# Patient Record
Sex: Female | Born: 1970 | Race: Black or African American | Hispanic: No | Marital: Married | Smoking: Never smoker
Health system: Southern US, Community
[De-identification: ages and names within clinical notes are randomized; demographics above are authoritative.]

## PROBLEM LIST (undated history)

## (undated) ENCOUNTER — Emergency Department (HOSPITAL_COMMUNITY): Payer: 59

## (undated) DIAGNOSIS — D3500 Benign neoplasm of unspecified adrenal gland: Secondary | ICD-10-CM

## (undated) DIAGNOSIS — I639 Cerebral infarction, unspecified: Secondary | ICD-10-CM

## (undated) DIAGNOSIS — E119 Type 2 diabetes mellitus without complications: Secondary | ICD-10-CM

## (undated) DIAGNOSIS — E079 Disorder of thyroid, unspecified: Secondary | ICD-10-CM

## (undated) DIAGNOSIS — F32A Depression, unspecified: Secondary | ICD-10-CM

## (undated) DIAGNOSIS — I10 Essential (primary) hypertension: Secondary | ICD-10-CM

## (undated) DIAGNOSIS — I2510 Atherosclerotic heart disease of native coronary artery without angina pectoris: Secondary | ICD-10-CM

## (undated) DIAGNOSIS — R4689 Other symptoms and signs involving appearance and behavior: Secondary | ICD-10-CM

## (undated) DIAGNOSIS — I67841 Reversible cerebrovascular vasoconstriction syndrome: Secondary | ICD-10-CM

## (undated) DIAGNOSIS — Z9289 Personal history of other medical treatment: Secondary | ICD-10-CM

## (undated) HISTORY — PX: CORONARY ARTERY BYPASS GRAFT: SHX141

## (undated) HISTORY — PX: ABDOMINAL HYSTERECTOMY: SHX81

## (undated) HISTORY — DX: Personal history of other medical treatment: Z92.89

## (undated) HISTORY — PX: OTHER SURGICAL HISTORY: SHX169

## (undated) SURGERY — ESOPHAGOGASTRODUODENOSCOPY (EGD) WITH PROPOFOL
Anesthesia: Monitor Anesthesia Care

---

## 1998-02-12 ENCOUNTER — Emergency Department (HOSPITAL_COMMUNITY): Admission: EM | Admit: 1998-02-12 | Discharge: 1998-02-12 | Payer: Self-pay | Admitting: Emergency Medicine

## 2000-12-11 ENCOUNTER — Emergency Department (HOSPITAL_COMMUNITY): Admission: EM | Admit: 2000-12-11 | Discharge: 2000-12-11 | Payer: Self-pay

## 2012-09-27 DIAGNOSIS — I639 Cerebral infarction, unspecified: Secondary | ICD-10-CM

## 2012-09-27 HISTORY — DX: Cerebral infarction, unspecified: I63.9

## 2014-10-28 DIAGNOSIS — I701 Atherosclerosis of renal artery: Secondary | ICD-10-CM | POA: Insufficient documentation

## 2014-11-28 DIAGNOSIS — B370 Candidal stomatitis: Secondary | ICD-10-CM | POA: Insufficient documentation

## 2015-07-07 DIAGNOSIS — K141 Geographic tongue: Secondary | ICD-10-CM | POA: Insufficient documentation

## 2015-07-18 DIAGNOSIS — R2990 Unspecified symptoms and signs involving the nervous system: Secondary | ICD-10-CM | POA: Insufficient documentation

## 2015-07-18 DIAGNOSIS — G8191 Hemiplegia, unspecified affecting right dominant side: Secondary | ICD-10-CM | POA: Insufficient documentation

## 2015-10-24 DIAGNOSIS — I6523 Occlusion and stenosis of bilateral carotid arteries: Secondary | ICD-10-CM | POA: Insufficient documentation

## 2015-10-25 DIAGNOSIS — Z86018 Personal history of other benign neoplasm: Secondary | ICD-10-CM | POA: Insufficient documentation

## 2016-10-19 DIAGNOSIS — Z91199 Patient's noncompliance with other medical treatment and regimen due to unspecified reason: Secondary | ICD-10-CM | POA: Insufficient documentation

## 2017-02-07 DIAGNOSIS — R21 Rash and other nonspecific skin eruption: Secondary | ICD-10-CM | POA: Insufficient documentation

## 2017-05-13 DIAGNOSIS — H0589 Other disorders of orbit: Secondary | ICD-10-CM | POA: Insufficient documentation

## 2017-12-23 DIAGNOSIS — E669 Obesity, unspecified: Secondary | ICD-10-CM | POA: Insufficient documentation

## 2017-12-24 DIAGNOSIS — D72829 Elevated white blood cell count, unspecified: Secondary | ICD-10-CM | POA: Insufficient documentation

## 2018-01-28 DIAGNOSIS — T8131XD Disruption of external operation (surgical) wound, not elsewhere classified, subsequent encounter: Secondary | ICD-10-CM | POA: Insufficient documentation

## 2018-01-28 DIAGNOSIS — Z951 Presence of aortocoronary bypass graft: Secondary | ICD-10-CM | POA: Insufficient documentation

## 2021-01-19 ENCOUNTER — Other Ambulatory Visit: Payer: Self-pay

## 2021-01-19 ENCOUNTER — Emergency Department: Payer: Medicare Other

## 2021-01-19 ENCOUNTER — Inpatient Hospital Stay: Payer: Medicare Other

## 2021-01-19 ENCOUNTER — Inpatient Hospital Stay
Admission: EM | Admit: 2021-01-19 | Discharge: 2021-02-02 | DRG: 065 | Disposition: A | Discharge status: Skilled Nursing Facility | Payer: Medicare Other | Attending: Student in an Organized Health Care Education/Training Program | Admitting: Student in an Organized Health Care Education/Training Program

## 2021-01-19 DIAGNOSIS — I10 Essential (primary) hypertension: Secondary | ICD-10-CM | POA: Diagnosis present

## 2021-01-19 DIAGNOSIS — E079 Disorder of thyroid, unspecified: Secondary | ICD-10-CM | POA: Diagnosis present

## 2021-01-19 DIAGNOSIS — R4701 Aphasia: Principal | ICD-10-CM

## 2021-01-19 DIAGNOSIS — E119 Type 2 diabetes mellitus without complications: Secondary | ICD-10-CM

## 2021-01-19 DIAGNOSIS — D3500 Benign neoplasm of unspecified adrenal gland: Secondary | ICD-10-CM | POA: Diagnosis present

## 2021-01-19 DIAGNOSIS — R000 Tachycardia, unspecified: Secondary | ICD-10-CM

## 2021-01-19 DIAGNOSIS — I639 Cerebral infarction, unspecified: Secondary | ICD-10-CM | POA: Diagnosis present

## 2021-01-19 DIAGNOSIS — E785 Hyperlipidemia, unspecified: Secondary | ICD-10-CM | POA: Diagnosis present

## 2021-01-19 DIAGNOSIS — I2510 Atherosclerotic heart disease of native coronary artery without angina pectoris: Secondary | ICD-10-CM | POA: Diagnosis present

## 2021-01-19 DIAGNOSIS — R112 Nausea with vomiting, unspecified: Secondary | ICD-10-CM | POA: Diagnosis present

## 2021-01-19 DIAGNOSIS — F32A Depression, unspecified: Secondary | ICD-10-CM | POA: Diagnosis present

## 2021-01-19 DIAGNOSIS — Z888 Allergy status to other drugs, medicaments and biological substances status: Secondary | ICD-10-CM

## 2021-01-19 DIAGNOSIS — F419 Anxiety disorder, unspecified: Secondary | ICD-10-CM | POA: Diagnosis present

## 2021-01-19 DIAGNOSIS — I69354 Hemiplegia and hemiparesis following cerebral infarction affecting left non-dominant side: Secondary | ICD-10-CM | POA: Diagnosis not present

## 2021-01-19 DIAGNOSIS — K219 Gastro-esophageal reflux disease without esophagitis: Secondary | ICD-10-CM | POA: Diagnosis present

## 2021-01-19 DIAGNOSIS — K5900 Constipation, unspecified: Secondary | ICD-10-CM | POA: Diagnosis present

## 2021-01-19 DIAGNOSIS — R29810 Facial weakness: Secondary | ICD-10-CM | POA: Diagnosis present

## 2021-01-19 DIAGNOSIS — R221 Localized swelling, mass and lump, neck: Secondary | ICD-10-CM | POA: Diagnosis present

## 2021-01-19 DIAGNOSIS — Z886 Allergy status to analgesic agent status: Secondary | ICD-10-CM

## 2021-01-19 DIAGNOSIS — Z79899 Other long term (current) drug therapy: Secondary | ICD-10-CM

## 2021-01-19 DIAGNOSIS — Z951 Presence of aortocoronary bypass graft: Secondary | ICD-10-CM

## 2021-01-19 DIAGNOSIS — H5340 Unspecified visual field defects: Secondary | ICD-10-CM | POA: Diagnosis present

## 2021-01-19 DIAGNOSIS — Z91041 Radiographic dye allergy status: Secondary | ICD-10-CM

## 2021-01-19 DIAGNOSIS — R1312 Dysphagia, oropharyngeal phase: Secondary | ICD-10-CM | POA: Diagnosis present

## 2021-01-19 DIAGNOSIS — E7849 Other hyperlipidemia: Secondary | ICD-10-CM | POA: Diagnosis not present

## 2021-01-19 DIAGNOSIS — Z90710 Acquired absence of both cervix and uterus: Secondary | ICD-10-CM

## 2021-01-19 DIAGNOSIS — R531 Weakness: Secondary | ICD-10-CM | POA: Diagnosis not present

## 2021-01-19 DIAGNOSIS — E109 Type 1 diabetes mellitus without complications: Secondary | ICD-10-CM

## 2021-01-19 DIAGNOSIS — R4689 Other symptoms and signs involving appearance and behavior: Secondary | ICD-10-CM

## 2021-01-19 DIAGNOSIS — R471 Dysarthria and anarthria: Secondary | ICD-10-CM | POA: Diagnosis present

## 2021-01-19 DIAGNOSIS — R29715 NIHSS score 15: Secondary | ICD-10-CM | POA: Diagnosis present

## 2021-01-19 DIAGNOSIS — Z880 Allergy status to penicillin: Secondary | ICD-10-CM

## 2021-01-19 DIAGNOSIS — Z8673 Personal history of transient ischemic attack (TIA), and cerebral infarction without residual deficits: Secondary | ICD-10-CM | POA: Diagnosis present

## 2021-01-19 DIAGNOSIS — E1169 Type 2 diabetes mellitus with other specified complication: Secondary | ICD-10-CM

## 2021-01-19 DIAGNOSIS — E1165 Type 2 diabetes mellitus with hyperglycemia: Secondary | ICD-10-CM | POA: Diagnosis present

## 2021-01-19 DIAGNOSIS — I618 Other nontraumatic intracerebral hemorrhage: Secondary | ICD-10-CM | POA: Diagnosis present

## 2021-01-19 DIAGNOSIS — E872 Acidosis, unspecified: Secondary | ICD-10-CM | POA: Diagnosis present

## 2021-01-19 DIAGNOSIS — G450 Vertebro-basilar artery syndrome: Secondary | ICD-10-CM | POA: Diagnosis not present

## 2021-01-19 DIAGNOSIS — K449 Diaphragmatic hernia without obstruction or gangrene: Secondary | ICD-10-CM | POA: Diagnosis present

## 2021-01-19 DIAGNOSIS — R490 Dysphonia: Secondary | ICD-10-CM | POA: Diagnosis present

## 2021-01-19 DIAGNOSIS — R2990 Unspecified symptoms and signs involving the nervous system: Secondary | ICD-10-CM | POA: Diagnosis not present

## 2021-01-19 DIAGNOSIS — I700 Atherosclerosis of aorta: Secondary | ICD-10-CM | POA: Diagnosis present

## 2021-01-19 DIAGNOSIS — Z6839 Body mass index (BMI) 39.0-39.9, adult: Secondary | ICD-10-CM

## 2021-01-19 DIAGNOSIS — Z85841 Personal history of malignant neoplasm of brain: Secondary | ICD-10-CM

## 2021-01-19 DIAGNOSIS — Z808 Family history of malignant neoplasm of other organs or systems: Secondary | ICD-10-CM

## 2021-01-19 DIAGNOSIS — Z20822 Contact with and (suspected) exposure to covid-19: Secondary | ICD-10-CM | POA: Diagnosis present

## 2021-01-19 DIAGNOSIS — K5909 Other constipation: Secondary | ICD-10-CM | POA: Diagnosis not present

## 2021-01-19 DIAGNOSIS — Z794 Long term (current) use of insulin: Secondary | ICD-10-CM

## 2021-01-19 DIAGNOSIS — F320 Major depressive disorder, single episode, mild: Secondary | ICD-10-CM | POA: Diagnosis not present

## 2021-01-19 DIAGNOSIS — I69320 Aphasia following cerebral infarction: Secondary | ICD-10-CM

## 2021-01-19 DIAGNOSIS — E0590 Thyrotoxicosis, unspecified without thyrotoxic crisis or storm: Secondary | ICD-10-CM | POA: Diagnosis present

## 2021-01-19 DIAGNOSIS — D3501 Benign neoplasm of right adrenal gland: Secondary | ICD-10-CM

## 2021-01-19 DIAGNOSIS — R220 Localized swelling, mass and lump, head: Secondary | ICD-10-CM | POA: Diagnosis not present

## 2021-01-19 DIAGNOSIS — I6389 Other cerebral infarction: Secondary | ICD-10-CM | POA: Diagnosis not present

## 2021-01-19 DIAGNOSIS — Z7902 Long term (current) use of antithrombotics/antiplatelets: Secondary | ICD-10-CM

## 2021-01-19 DIAGNOSIS — Z833 Family history of diabetes mellitus: Secondary | ICD-10-CM

## 2021-01-19 DIAGNOSIS — R258 Other abnormal involuntary movements: Secondary | ICD-10-CM | POA: Diagnosis present

## 2021-01-19 HISTORY — DX: Essential (primary) hypertension: I10

## 2021-01-19 HISTORY — DX: Disorder of thyroid, unspecified: E07.9

## 2021-01-19 HISTORY — DX: Atherosclerotic heart disease of native coronary artery without angina pectoris: I25.10

## 2021-01-19 HISTORY — DX: Type 2 diabetes mellitus without complications: E11.9

## 2021-01-19 HISTORY — DX: Depression, unspecified: F32.A

## 2021-01-19 HISTORY — DX: Benign neoplasm of unspecified adrenal gland: D35.00

## 2021-01-19 HISTORY — DX: Cerebral infarction, unspecified: I63.9

## 2021-01-19 LAB — URINE DRUG SCREEN, QUALITATIVE (ARMC ONLY)
Amphetamines, Ur Screen: NOT DETECTED
Barbiturates, Ur Screen: NOT DETECTED
Benzodiazepine, Ur Scrn: NOT DETECTED
Cannabinoid 50 Ng, Ur ~~LOC~~: NOT DETECTED
Cocaine Metabolite,Ur ~~LOC~~: NOT DETECTED
MDMA (Ecstasy)Ur Screen: NOT DETECTED
Methadone Scn, Ur: NOT DETECTED
Opiate, Ur Screen: NOT DETECTED
Phencyclidine (PCP) Ur S: NOT DETECTED
Tricyclic, Ur Screen: POSITIVE — AB

## 2021-01-19 LAB — APTT: aPTT: 29 seconds (ref 24–36)

## 2021-01-19 LAB — URINALYSIS, COMPLETE (UACMP) WITH MICROSCOPIC
Bilirubin Urine: NEGATIVE
Glucose, UA: >1000 mg/dL — AB
Hgb urine dipstick: NEGATIVE
Ketones, ur: 80 mg/dL — AB
Nitrite: NEGATIVE
Protein, ur: NEGATIVE mg/dL
Specific Gravity, Urine: <1.005 — ABNORMAL LOW (ref 1.005–1.030)
pH: 5 (ref 5.0–8.0)

## 2021-01-19 LAB — CBG MONITORING, ED
Glucose-Capillary: 312 mg/dL — ABNORMAL HIGH (ref 70–99)
Glucose-Capillary: 285 mg/dL — ABNORMAL HIGH (ref 70–99)
Glucose-Capillary: 322 mg/dL — ABNORMAL HIGH (ref 70–99)
Glucose-Capillary: 314 mg/dL — ABNORMAL HIGH (ref 70–99)

## 2021-01-19 LAB — CBC
HCT: 39.2 % (ref 36.0–46.0)
Hemoglobin: 13 g/dL (ref 12.0–15.0)
MCH: 26.5 pg (ref 26.0–34.0)
MCHC: 33.2 g/dL (ref 30.0–36.0)
MCV: 79.8 fL — ABNORMAL LOW (ref 80.0–100.0)
Platelets: 366 10*3/uL (ref 150–400)
RBC: 4.91 MIL/uL (ref 3.87–5.11)
RDW: 14.3 % (ref 11.5–15.5)
WBC: 8 10*3/uL (ref 4.0–10.5)
nRBC: 0 % (ref 0.0–0.2)

## 2021-01-19 LAB — COMPREHENSIVE METABOLIC PANEL
ALT: 61 U/L — ABNORMAL HIGH (ref 0–44)
AST: 56 U/L — ABNORMAL HIGH (ref 15–41)
Albumin: 3.7 g/dL (ref 3.5–5.0)
Alkaline Phosphatase: 90 U/L (ref 38–126)
Anion gap: 12 (ref 5–15)
BUN: 12 mg/dL (ref 6–20)
CO2: 24 mmol/L (ref 22–32)
Calcium: 9.1 mg/dL (ref 8.9–10.3)
Chloride: 96 mmol/L — ABNORMAL LOW (ref 98–111)
Creatinine, Ser: 0.7 mg/dL (ref 0.44–1.00)
GFR, Estimated: >60 mL/min (ref >60)
Glucose, Bld: 336 mg/dL — ABNORMAL HIGH (ref 70–99)
Potassium: 3.9 mmol/L (ref 3.5–5.1)
Sodium: 132 mmol/L — ABNORMAL LOW (ref 135–145)
Total Bilirubin: 0.9 mg/dL (ref 0.3–1.2)
Total Protein: 8 g/dL (ref 6.5–8.1)

## 2021-01-19 LAB — LACTIC ACID, PLASMA
Lactic Acid, Venous: 2 mmol/L (ref 0.5–1.9)
Lactic Acid, Venous: 1.4 mmol/L (ref 0.5–1.9)

## 2021-01-19 LAB — DIFFERENTIAL
Abs Immature Granulocytes: 0.04 10*3/uL (ref 0.00–0.07)
Basophils Absolute: 0 10*3/uL (ref 0.0–0.1)
Basophils Relative: 0 %
Eosinophils Absolute: 0 10*3/uL (ref 0.0–0.5)
Eosinophils Relative: 1 %
Immature Granulocytes: 1 %
Lymphocytes Relative: 19 %
Lymphs Abs: 1.5 10*3/uL (ref 0.7–4.0)
Monocytes Absolute: 0.4 10*3/uL (ref 0.1–1.0)
Monocytes Relative: 5 %
Neutro Abs: 5.9 10*3/uL (ref 1.7–7.7)
Neutrophils Relative %: 74 %

## 2021-01-19 LAB — PROTIME-INR
INR: 1 (ref 0.8–1.2)
Prothrombin Time: 13.5 seconds (ref 11.4–15.2)

## 2021-01-19 LAB — TROPONIN I (HIGH SENSITIVITY)
Troponin I (High Sensitivity): 5 ng/L (ref <18)
Troponin I (High Sensitivity): 5 ng/L (ref <18)

## 2021-01-19 LAB — PREGNANCY, URINE: Preg Test, Ur: NEGATIVE

## 2021-01-19 LAB — TSH: TSH: 0.257 u[IU]/mL — ABNORMAL LOW (ref 0.350–4.500)

## 2021-01-19 LAB — RESP PANEL BY RT-PCR (FLU A&B, COVID) ARPGX2
Influenza A by PCR: NEGATIVE
Influenza B by PCR: NEGATIVE
SARS Coronavirus 2 by RT PCR: NEGATIVE

## 2021-01-19 LAB — I-STAT CREATININE, ED: Creatinine, Ser: 0.6 mg/dL (ref 0.44–1.00)

## 2021-01-19 IMAGING — CT CT HEAD CODE STROKE
4 series · 17 of 47 positions shown, 19 images · non-contrast
Comparison: None.

CLINICAL DATA: Code stroke. Generalized weakness. Aphasia and
left-sided deficits from prior stroke

EXAM:
CT HEAD WITHOUT CONTRAST
TECHNIQUE: Contiguous axial images were obtained from the base of the skull
through the vertex without intravenous contrast.

[Series 3: head wo · axial · 0.40mm/px · z∈[-51,+64]mm · 7 of 31 slices shown, 9 images]
[im 4/31  brain]
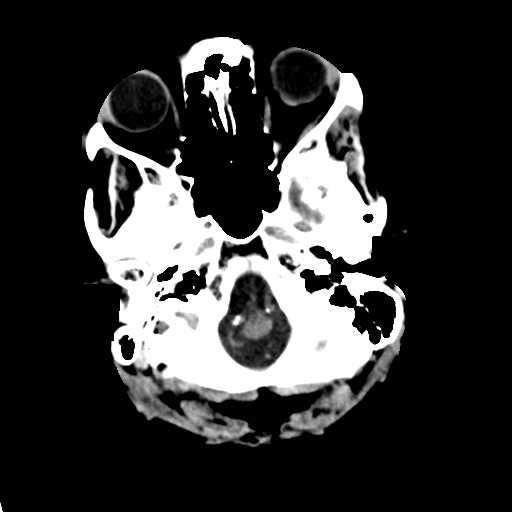
[im 4/31  bone]
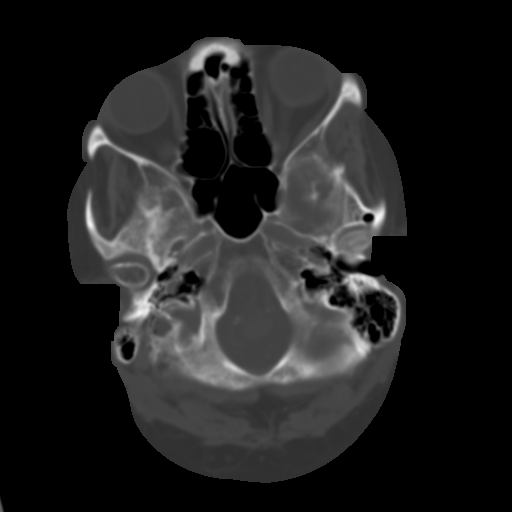
[im 8/31  brain]
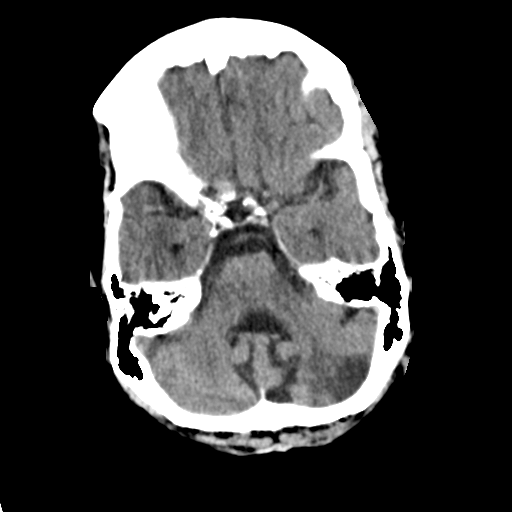
[im 12/31  brain]
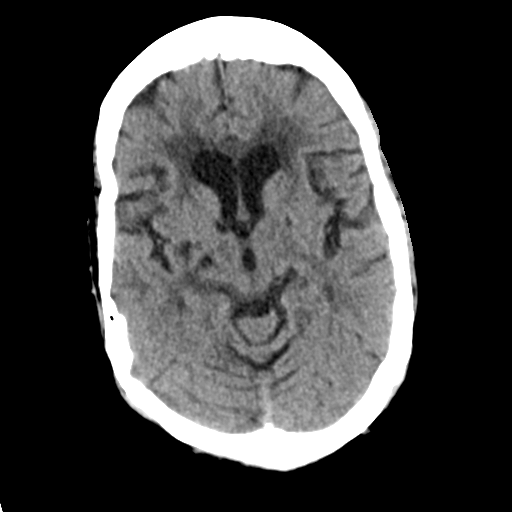
[im 16/31  brain]
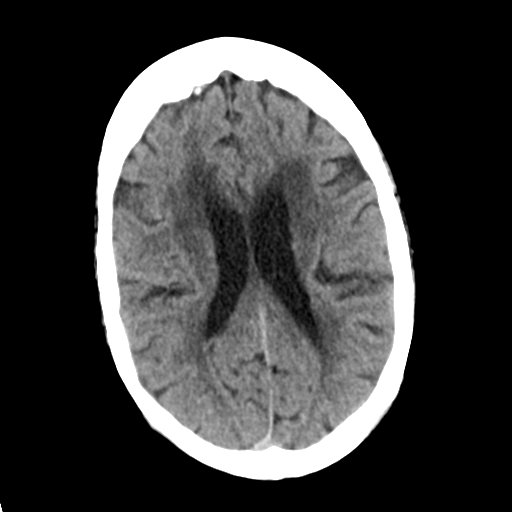
[im 19/31  brain]
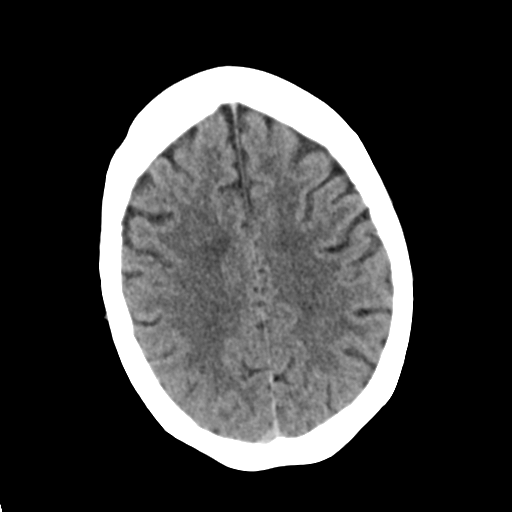
[im 19/31  bone]
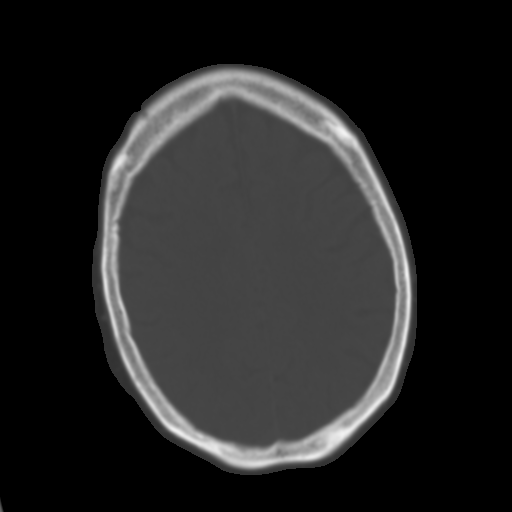
[im 23/31  brain]
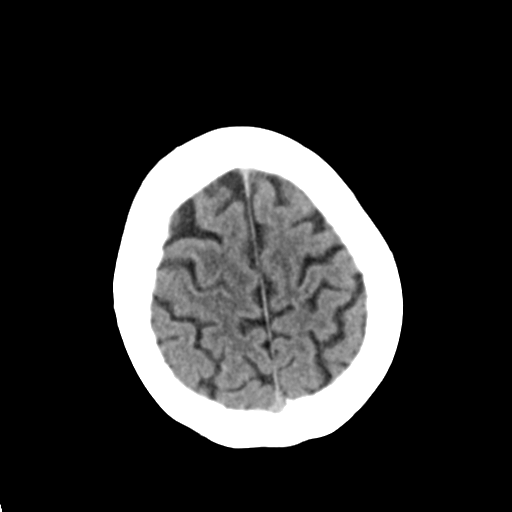
[im 27/31  brain]
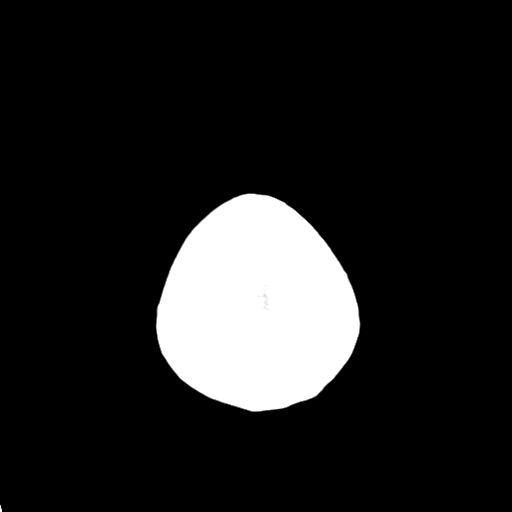

[Series 4: head bone · axial · 0.40mm/px · z∈[-52,+0]mm · 4 of 76 slices shown]
[im 8/76  bone]
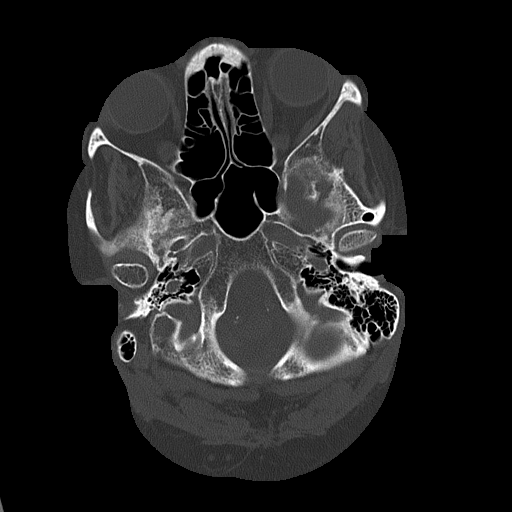
[im 16/76  bone]
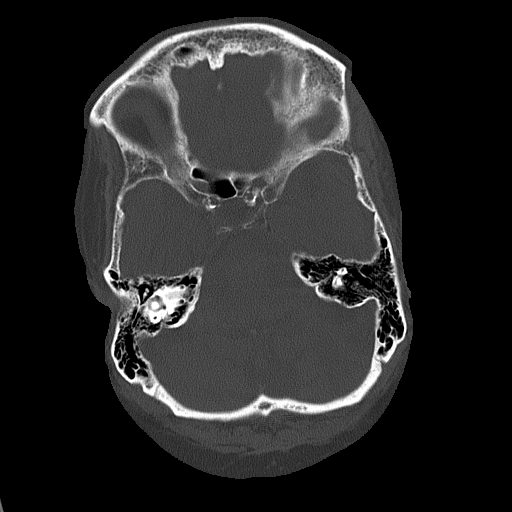
[im 23/76  bone]
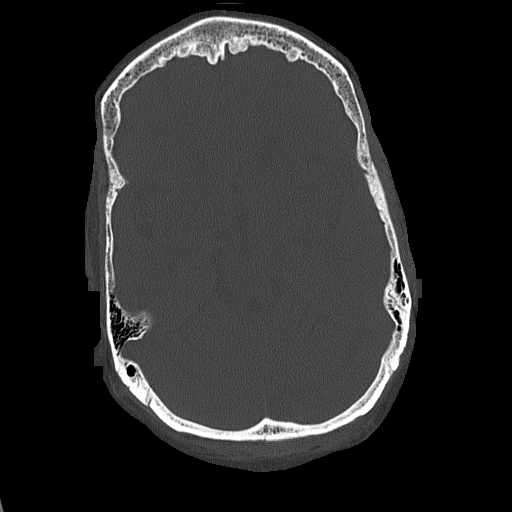
[im 34/76  bone]
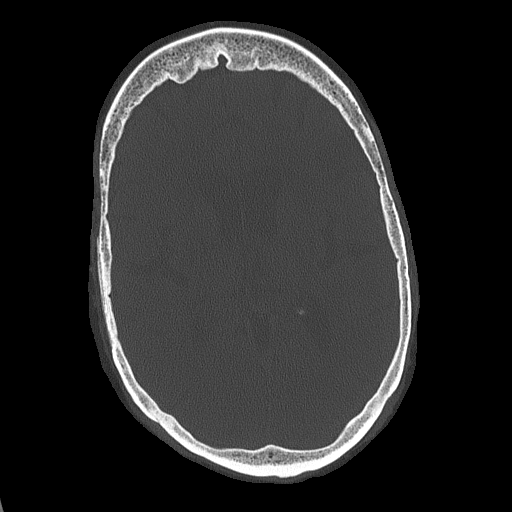

[Series 5: coronal soft tissue · coronal · 0.30mm/px · 3 of 64 slices shown]
[im 22/64  brain]
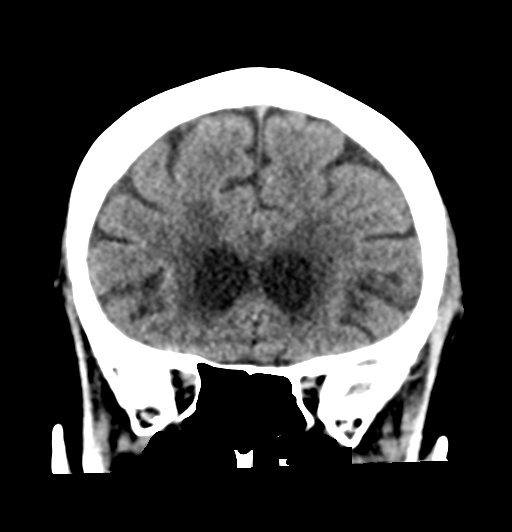
[im 29/64  brain]
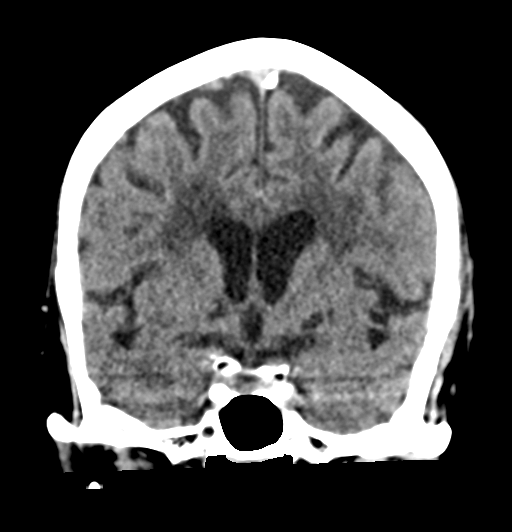
[im 36/64  brain]
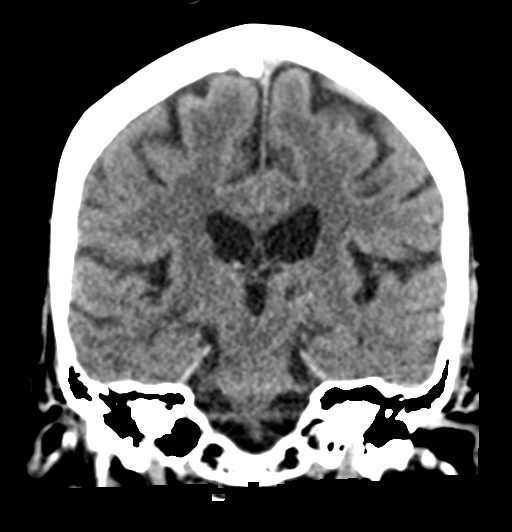

[Series 6: sagittal soft tissue · sagittal · 0.33mm/px · 3 of 50 slices shown]
[im 17/50  brain]
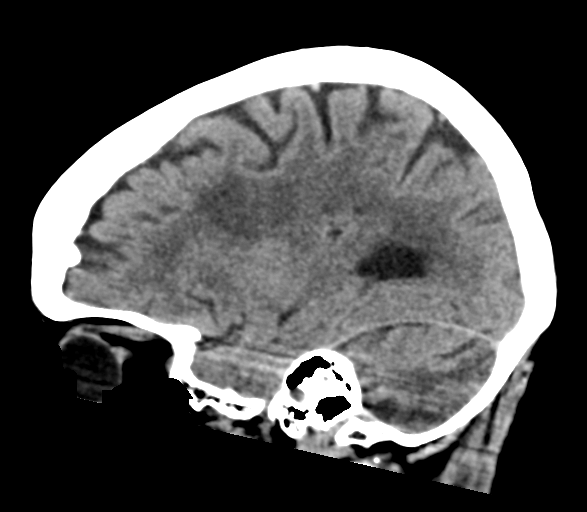
[im 25/50  brain]
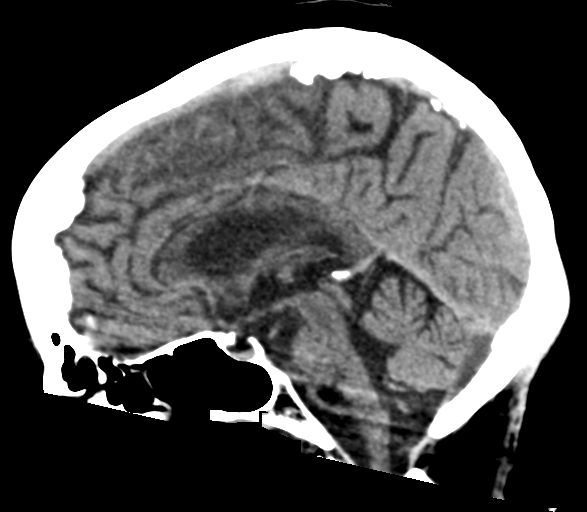
[im 33/50  brain]
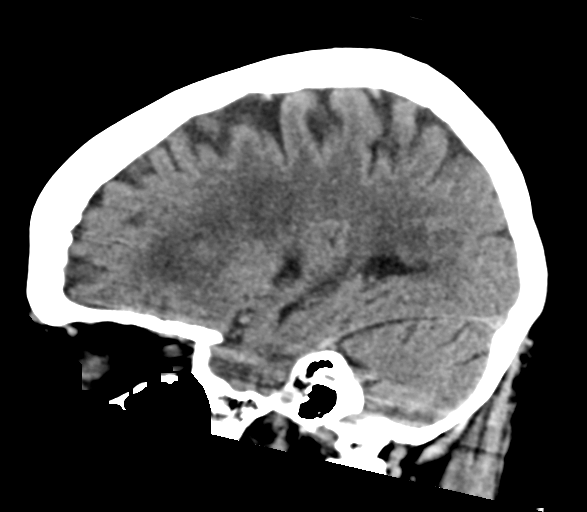

[17 of 47 positions shown; findings below may reference images not displayed]

FINDINGS: Brain: Confluent low-density in the cerebral white matter with
superimposed chronic lacunar infarcts at the deep gray nuclei and
deep white matter tracks. Remote left cerebellar infarction which is
moderate to extensive. Premature brain atrophy. No acute hemorrhage,
hydrocephalus, collection, or masslike finding. Brain atrophy
especially affecting the cerebellum.

Vascular: No hyperdense vessel. Premature atheromatous
calcification.

Skull: Normal. Negative for fracture or focal lesion.

Sinuses/Orbits: No acute finding.

Other: These results were called by telephone at the time of
interpretation on [DATE] at [DATE] to provider ILEANA
, who verbally acknowledged these results.

ASPECTS ([place] Stroke Program Early CT Score)

No acute infarct.
IMPRESSION: 1. No acute finding.
2. Severe chronic small vessel disease with brain atrophy.

## 2021-01-19 IMAGING — DX DG CHEST 1V PORT
1 series · 1 of 1 positions shown · non-contrast
Comparison: None.

CLINICAL DATA: Sepsis.

EXAM:
PORTABLE CHEST 1 VIEW

[chest ap]
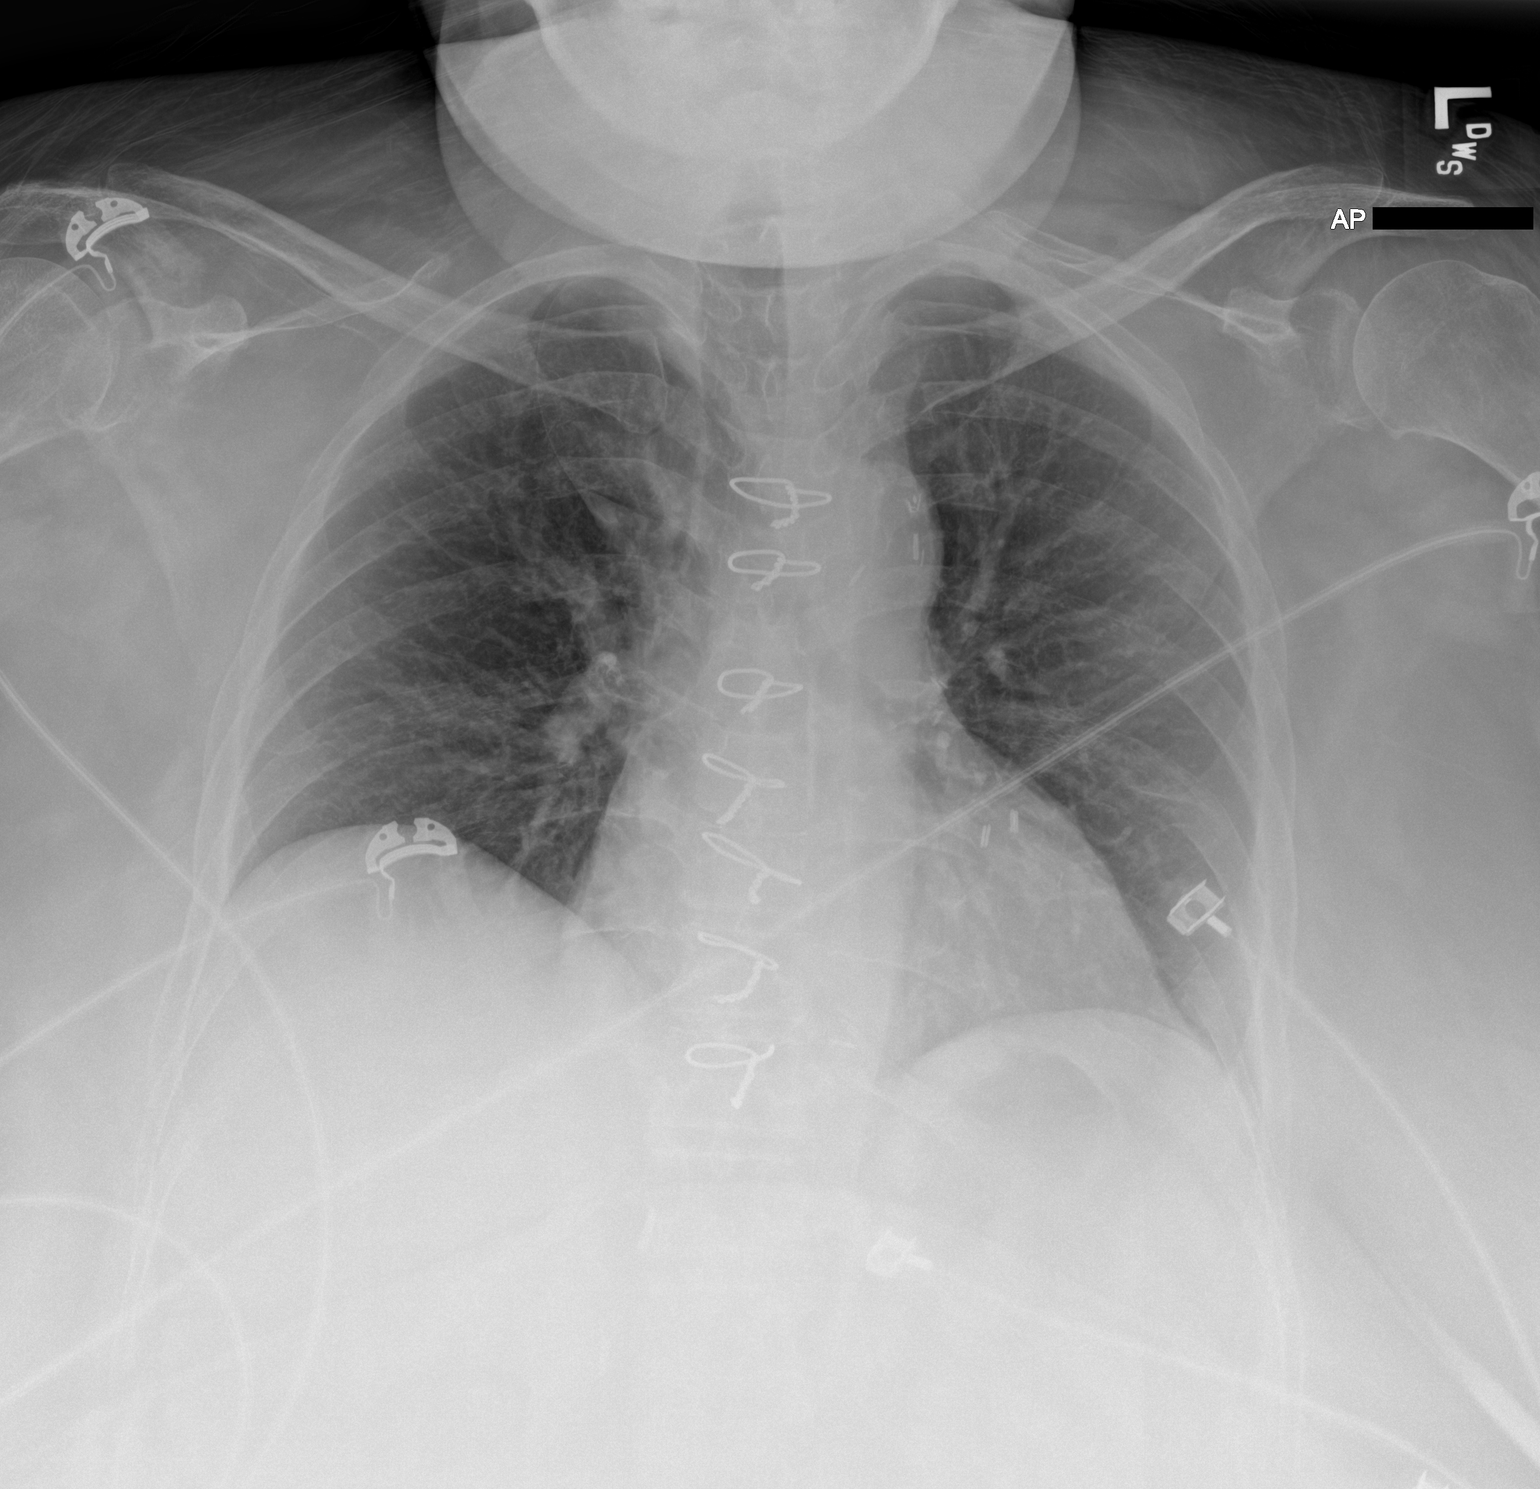

[1 of 1 positions shown; findings below may reference images not displayed]

FINDINGS: The heart size and mediastinal contours are within normal limits.
Both lungs are clear. Sternotomy wires are noted. The visualized
skeletal structures are unremarkable.
IMPRESSION: No active disease.

## 2021-01-19 IMAGING — CT CT ANGIO HEAD
3 of 7 series · 10 of 35 positions shown · IV contrast (omnipaque)
Comparison: None.

CLINICAL DATA: Generalized weakness with aphasia. Left-sided
deficits. Vomiting.

EXAM:
CT ANGIOGRAPHY HEAD AND NECK
TECHNIQUE: Multidetector CT imaging of the head and neck was performed using
the standard protocol during bolus administration of intravenous
contrast. Multiplanar CT image reconstructions and MIPs were
obtained to evaluate the vascular anatomy. Carotid stenosis
measurements (when applicable) are obtained utilizing NASCET
criteria, using the distal internal carotid diameter as the
denominator.
CONTRAST:  75mL OMNIPAQUE IOHEXOL 350 MG/ML SOLN

[Series 5: cta head neck · axial · 0.48mm/px · z∈[-138,-30]mm · 2 of 163 slices shown]
[im 55/163  soft-tissue]
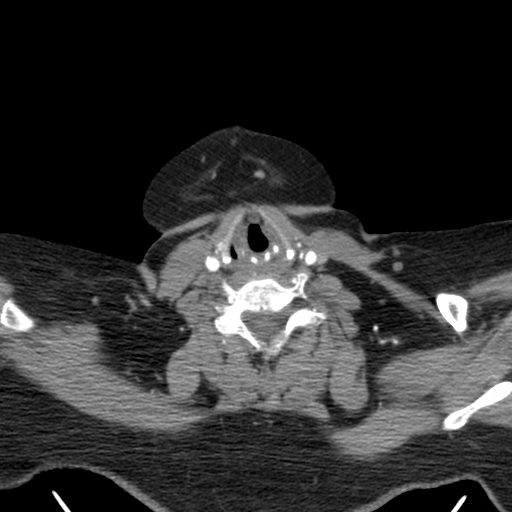
[im 109/163  soft-tissue]
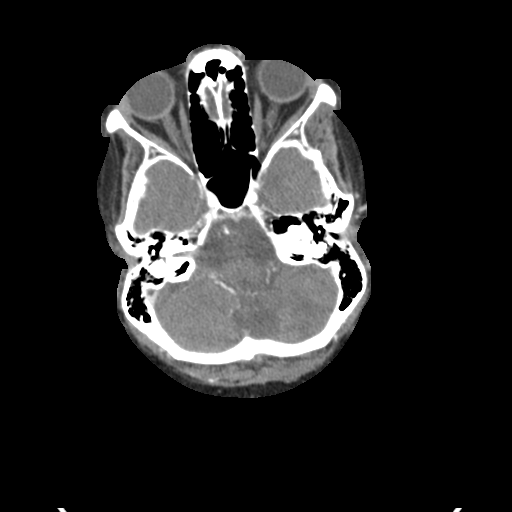

[Series 7: ax thin · axial · 0.44mm/px · z∈[-211,+31]mm · 6 of 341 slices shown]
[im 49/341  soft-tissue]
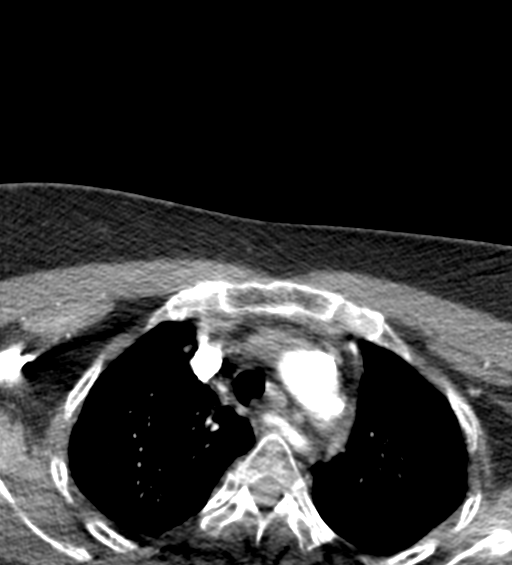
[im 98/341  bone]
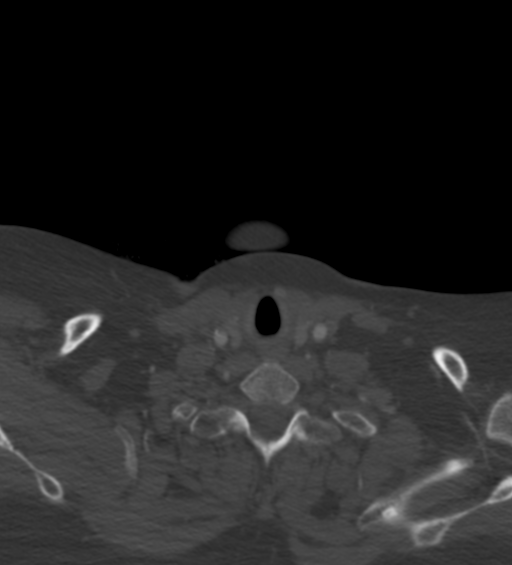
[im 146/341  soft-tissue]
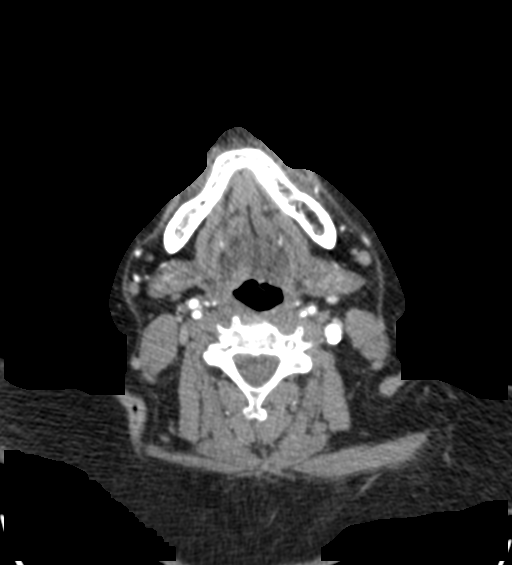
[im 195/341  bone]
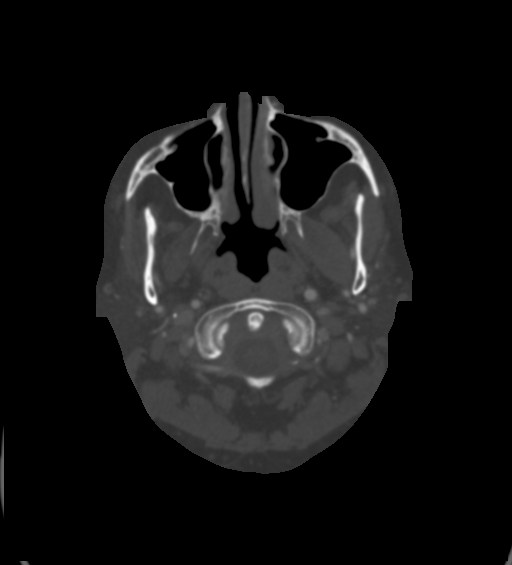
[im 243/341  soft-tissue]
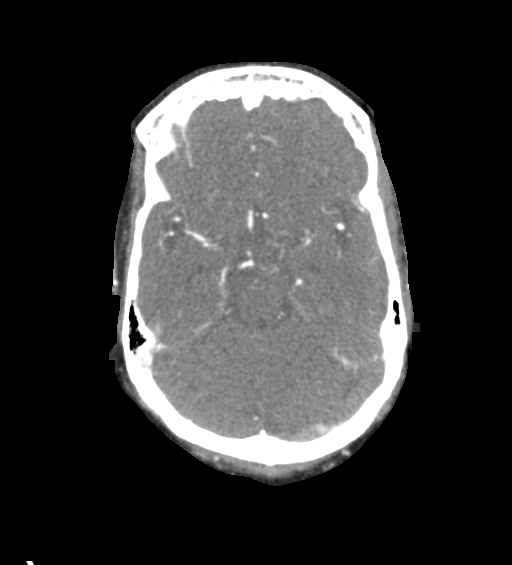
[im 292/341  bone]
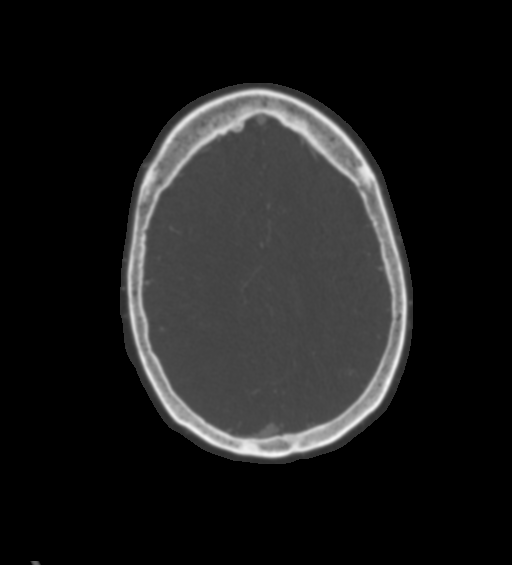

[Series 9: sagittal thin · sagittal · 0.47mm/px · 2 of 188 slices shown]
[im 31/188  soft-tissue]
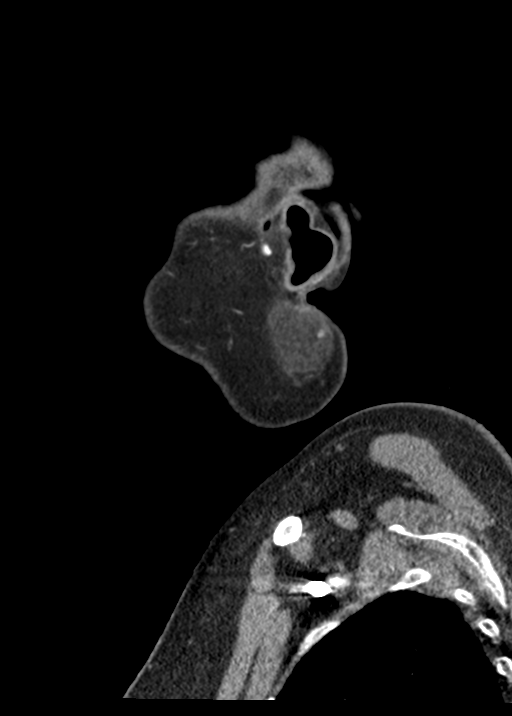
[im 181/188  soft-tissue]
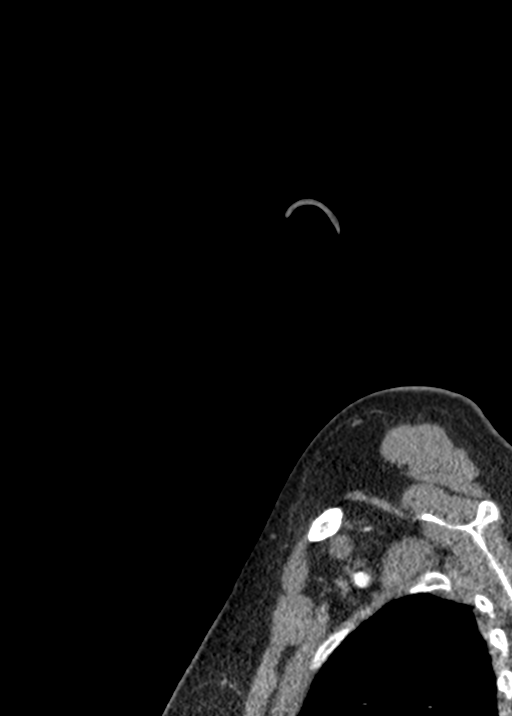

[10 of 35 positions shown; findings below may reference images not displayed]

FINDINGS: CTA NECK FINDINGS

Aortic arch: Atheromatous plaque. Aberrant right subclavian artery
with retroesophageal course

Right carotid system: Atheromatous plaque along the common carotid
and proximal ICA with calcified plaque bulging into the lumen of the
bulb but not causing over 50% stenosis. No dissection or ulceration.

Left carotid system: Age advanced atheromatous plaque along the
common carotid and proximal ICA without flow limiting stenosis or
ulceration.

Vertebral arteries: Aberrant right subclavian artery. Significant
for age atheromatous plaque at the bilateral proximal subclavian,
but without flow limiting stenosis. Heavily diseased vertebral
arteries with extensive calcific plaque and multifocal narrowing.
The origins are difficult to assess due to small vessel size,
calcified plaque, and body habitus. Both vertebral arteries are
patent at the dura although there is subsequent occlusion on the
left.

Skeleton: No acute finding

Other neck: No acute finding

Upper chest: No acute finding

Review of the MIP images confirms the above findings

CTA HEAD FINDINGS

Anterior circulation: Heavily diseased carotid siphon with calcified
plaque and multifocal luminal stenosis. The degree of calcified
plaque limits lumen measurement, expect flow limiting stenosis on
both sides. Aplastic right A1 segment with right ICA smaller than
the left. Extensive atheromatous plaque affecting the M1 segments
and MCA branches. Moderate narrowing at the bilateral M1 segment.
Negative for aneurysm.

Posterior circulation: Heavily diseased vertebral arteries with
calcified plaque. Severe right vertebral stenosis beyond the PICA.
Left proximal V4 segment occlusion with potentially retrograde flow
seen at the proximal left PICA. Narrow basilar diffusely with 2
segments of non enhancement. Fetal type bilateral PCA flow.

Venous sinuses: No emergent finding

Anatomic variants: As above

Review of the MIP images confirms the above findings

Critical Value/emergent results were called by telephone at the time
of interpretation on [DATE] at [DATE] to provider DIB
, who verbally acknowledged these results.
IMPRESSION: 1. Generalized and severe atherosclerosis, especially for age.
2. Dominant findings in the posterior circulation where there are
left vertebral and basilar occlusions which are age indeterminate.
Severe right V4 segment stenosis.
3. No flow limiting stenosis in the cervical carotid circulation.
4. Probable flow limiting stenosis at the carotid siphons,
especially on the right.

## 2021-01-19 IMAGING — CT CT ABD-PELV W/O CM
2 of 4 series · 15 of 46 positions shown, 17 images · non-contrast
Comparison: None.

CLINICAL DATA: Nausea, vomiting, abdominal pain.

EXAM:
CT ABDOMEN AND PELVIS WITHOUT CONTRAST
TECHNIQUE: Multidetector CT imaging of the abdomen and pelvis was performed
following the standard protocol without IV contrast.

[Series 2: routine abd/pel wo · axial · 0.71mm/px · z∈[-1126,-662]mm · 12 of 103 slices shown, 14 images]
[im 5/103  soft-tissue]
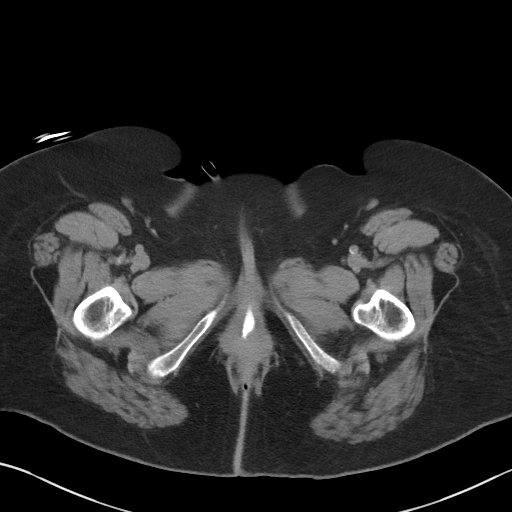
[im 5/103  bone]
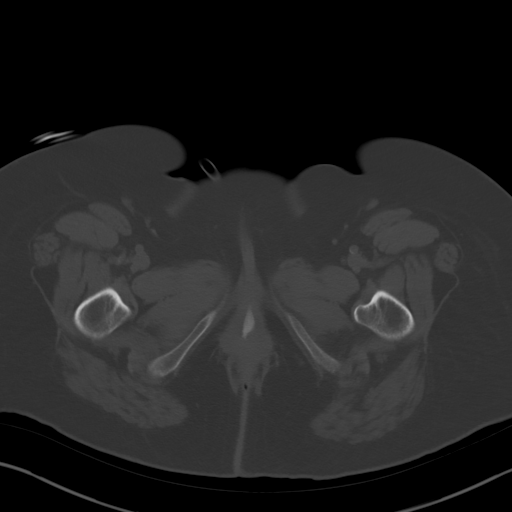
[im 13/103  soft-tissue]
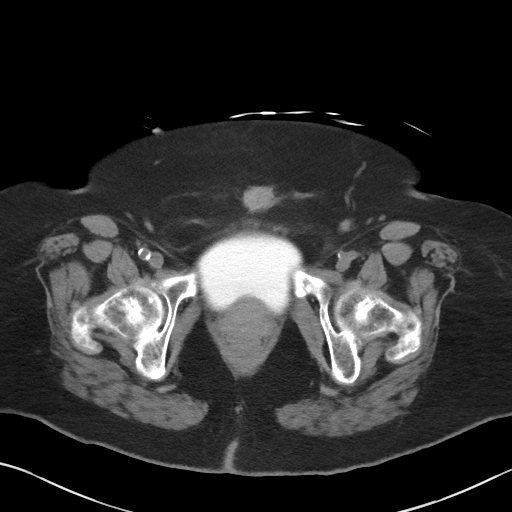
[im 22/103  soft-tissue]
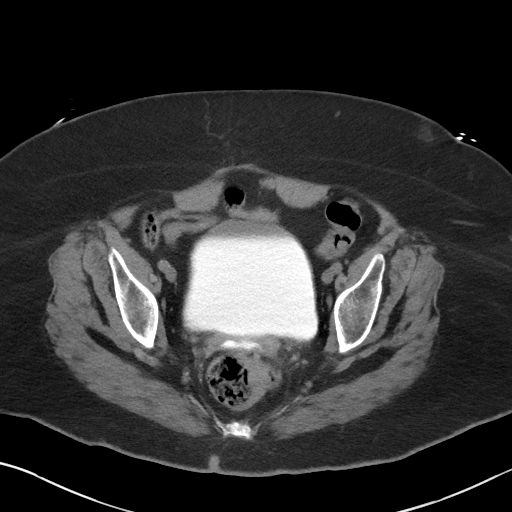
[im 30/103  soft-tissue]
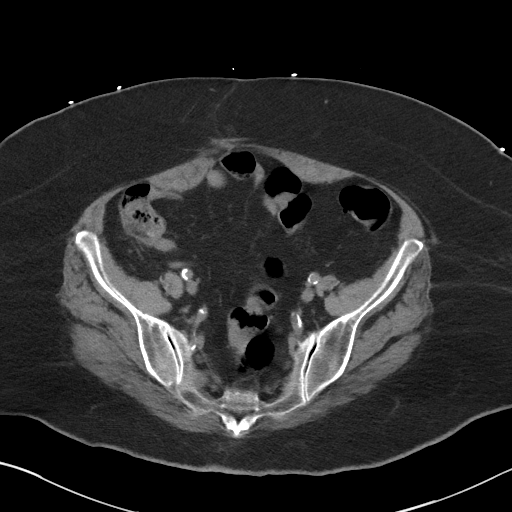
[im 39/103  soft-tissue]
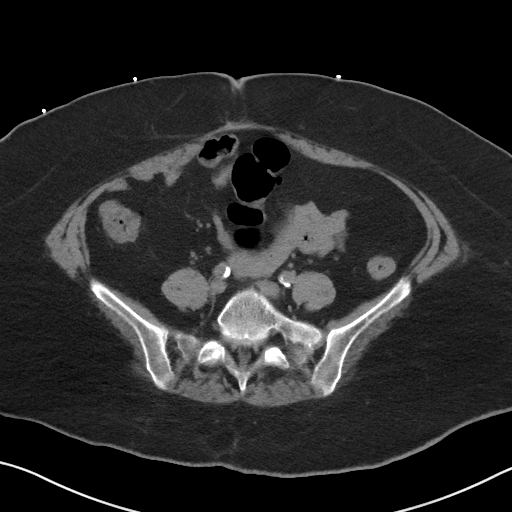
[im 47/103  soft-tissue]
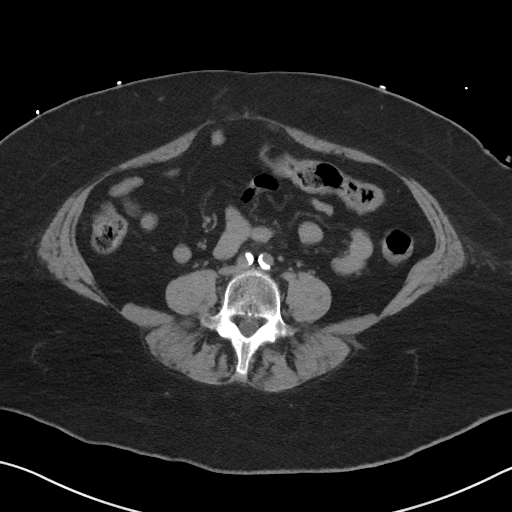
[im 56/103  soft-tissue]
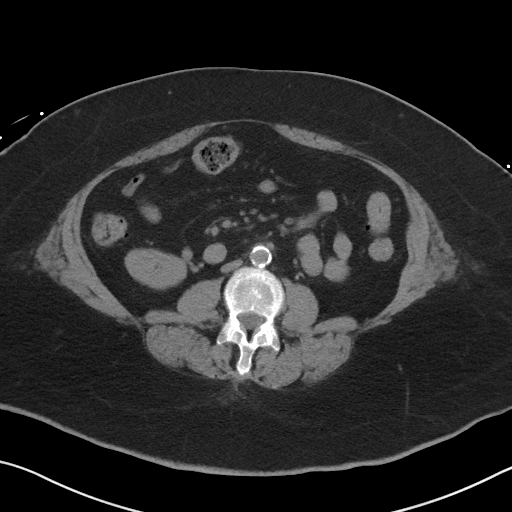
[im 64/103  soft-tissue]
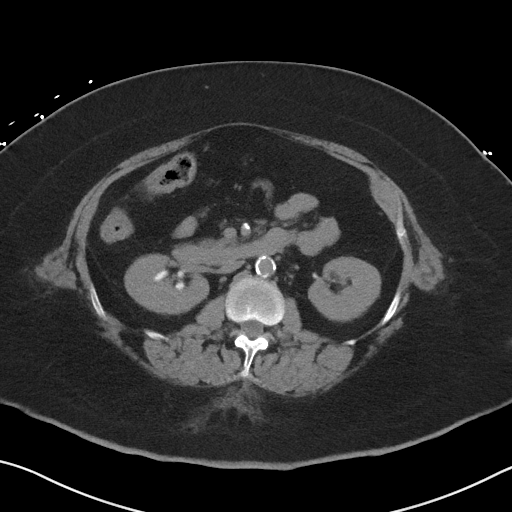
[im 73/103  soft-tissue]
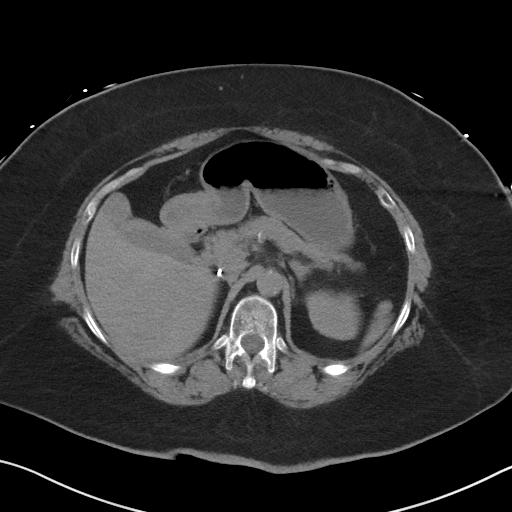
[im 73/103  bone]
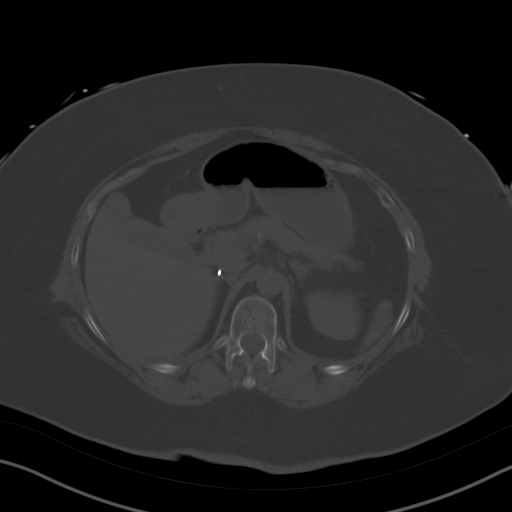
[im 81/103  soft-tissue]
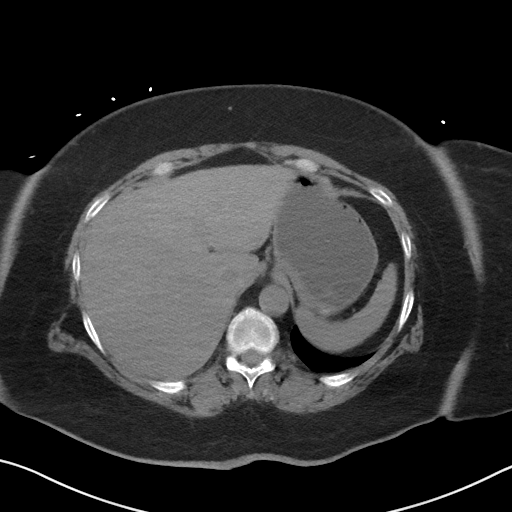
[im 90/103  soft-tissue]
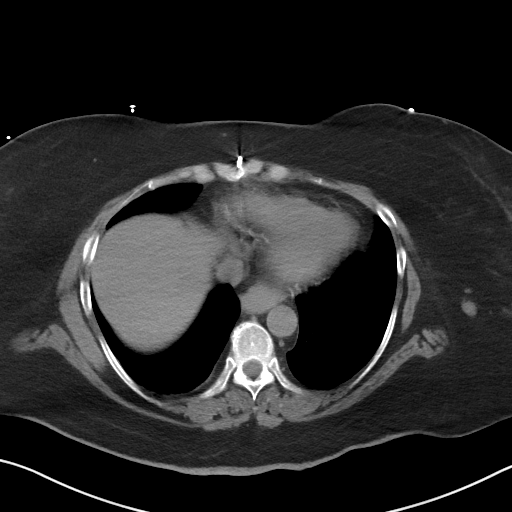
[im 98/103  soft-tissue]
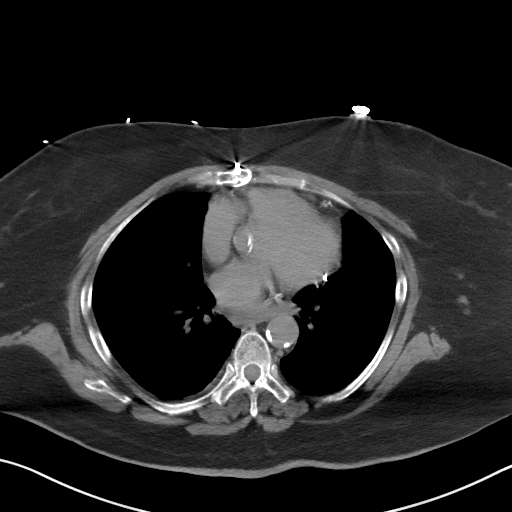

[Series 5: coronal st · coronal · 0.68mm/px · 3 of 101 slices shown]
[im 34/101  soft-tissue]
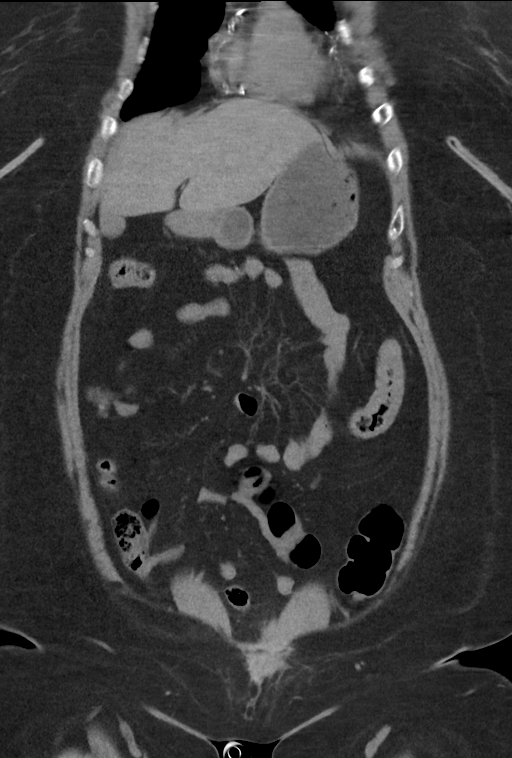
[im 45/101  soft-tissue]
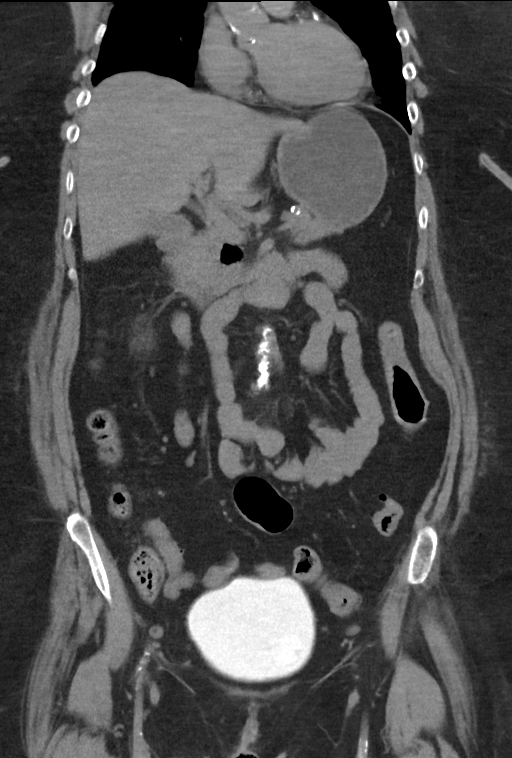
[im 56/101  soft-tissue]
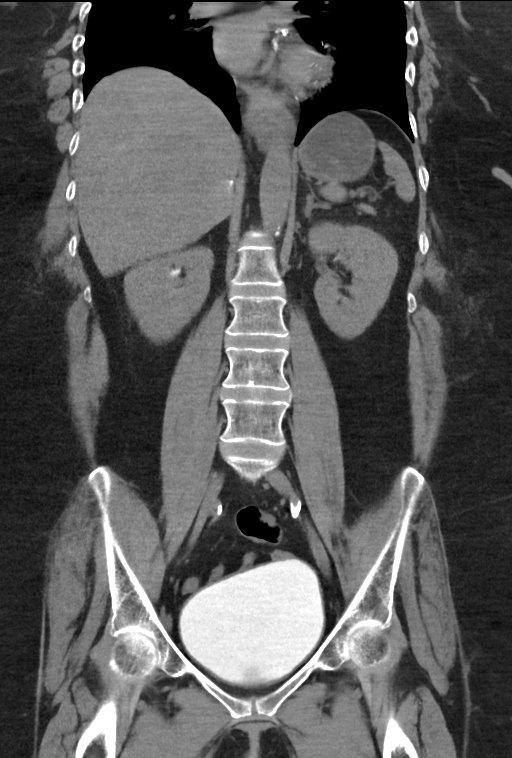

[15 of 46 positions shown; findings below may reference images not displayed]

FINDINGS: Lower chest: Small sliding-type hiatal hernia. Visualized lung bases
are unremarkable.

Hepatobiliary: No focal liver abnormality is seen. No gallstones,
gallbladder wall thickening, or biliary dilatation.

Pancreas: Unremarkable. No pancreatic ductal dilatation or
surrounding inflammatory changes.

Spleen: Normal in size without focal abnormality.

Adrenals/Urinary Tract: Adrenal glands are unremarkable. Kidneys are
normal, without renal calculi, focal lesion, or hydronephrosis.
Bladder is unremarkable.

Stomach/Bowel: Stomach is within normal limits. Appendix appears
normal. No evidence of bowel wall thickening, distention, or
inflammatory changes.

Vascular/Lymphatic: Aortic atherosclerosis. No enlarged abdominal or
pelvic lymph nodes.

Reproductive: Patient appears to be status post hysterectomy. No
definite adnexal abnormality is noted. However, there appears to be
contrast within the vaginal canal concerning for vesicovaginal
fistula.

Other: No abdominal wall hernia or abnormality. No abdominopelvic
ascites.

Musculoskeletal: No acute or significant osseous findings.
IMPRESSION: Patient appears to be status post hysterectomy. There appears to be
contrast within the vaginal canal concerning for vesicovaginal
fistula.

Small sliding-type hiatal hernia.

Aortic Atherosclerosis ([7V]-[7V]).

## 2021-01-19 MED ORDER — SODIUM CHLORIDE 0.9 % IV BOLUS
500.0000 mL | Freq: Once | INTRAVENOUS | Status: AC
  Administered 2021-01-19: 500 mL via INTRAVENOUS

## 2021-01-19 MED ORDER — DIAZEPAM 5 MG/ML IJ SOLN
2.5000 mg | Freq: Once | INTRAMUSCULAR | Status: AC
  Administered 2021-01-19: 2.5 mg via INTRAVENOUS
  Filled 2021-01-19 (×2): qty 2

## 2021-01-19 MED ORDER — ATORVASTATIN CALCIUM 20 MG PO TABS
80.0000 mg | ORAL_TABLET | Freq: Every day | ORAL | Status: DC
  Administered 2021-01-20 – 2021-02-02 (×13): 80 mg via ORAL
  Filled 2021-01-19 (×15): qty 4

## 2021-01-19 MED ORDER — STROKE: EARLY STAGES OF RECOVERY BOOK: Freq: Once | Status: DC

## 2021-01-19 MED ORDER — METOPROLOL TARTRATE 50 MG PO TABS: 75.0000 mg | ORAL_TABLET | Freq: Two times a day (BID) | ORAL | Status: DC

## 2021-01-19 MED ORDER — MELATONIN 5 MG PO TABS
10.0000 mg | ORAL_TABLET | Freq: Every day | ORAL | Status: DC
  Administered 2021-01-19 – 2021-02-01 (×14): 10 mg via ORAL
  Filled 2021-01-19 (×16): qty 2

## 2021-01-19 MED ORDER — INSULIN ASPART 100 UNIT/ML IJ SOLN
0.0000 [IU] | Freq: Every day | INTRAMUSCULAR | Status: DC
  Administered 2021-01-19: 4 [IU] via SUBCUTANEOUS
  Administered 2021-01-20: 3 [IU] via SUBCUTANEOUS
  Administered 2021-01-21 – 2021-01-24 (×4): 5 [IU] via SUBCUTANEOUS
  Administered 2021-01-30: 2 [IU] via SUBCUTANEOUS
  Filled 2021-01-19 (×7): qty 1

## 2021-01-19 MED ORDER — GABAPENTIN 100 MG PO CAPS
200.0000 mg | ORAL_CAPSULE | Freq: Three times a day (TID) | ORAL | Status: DC
  Administered 2021-01-19 – 2021-02-02 (×39): 200 mg via ORAL
  Filled 2021-01-19 (×41): qty 2

## 2021-01-19 MED ORDER — LABETALOL HCL 5 MG/ML IV SOLN
10.0000 mg | Freq: Once | INTRAVENOUS | Status: AC
  Administered 2021-01-19: 10 mg via INTRAVENOUS
  Filled 2021-01-19: qty 4

## 2021-01-19 MED ORDER — IOHEXOL 350 MG/ML SOLN
75.0000 mL | Freq: Once | INTRAVENOUS | Status: AC | PRN
  Administered 2021-01-19: 75 mL via INTRAVENOUS

## 2021-01-19 MED ORDER — PANTOPRAZOLE SODIUM 40 MG PO TBEC
40.0000 mg | DELAYED_RELEASE_TABLET | Freq: Every day | ORAL | Status: DC
  Filled 2021-01-19: qty 1

## 2021-01-19 MED ORDER — ACETAMINOPHEN 650 MG RE SUPP: 650.0000 mg | RECTAL | Status: DC | PRN

## 2021-01-19 MED ORDER — METOPROLOL TARTRATE 5 MG/5ML IV SOLN
5.0000 mg | Freq: Once | INTRAVENOUS | Status: AC
  Administered 2021-01-19: 5 mg via INTRAVENOUS
  Filled 2021-01-19: qty 5

## 2021-01-19 MED ORDER — METOPROLOL TARTRATE 25 MG PO TABS
25.0000 mg | ORAL_TABLET | Freq: Once | ORAL | Status: AC
  Administered 2021-01-19: 25 mg via ORAL
  Filled 2021-01-19: qty 1

## 2021-01-19 MED ORDER — ASPIRIN EC 81 MG PO TBEC
81.0000 mg | DELAYED_RELEASE_TABLET | Freq: Every day | ORAL | Status: DC
  Administered 2021-01-20: 81 mg via ORAL
  Filled 2021-01-19 (×2): qty 1

## 2021-01-19 MED ORDER — BISMUTH SUBSALICYLATE 262 MG/15ML PO SUSP
30.0000 mL | ORAL | Status: DC | PRN
  Administered 2021-01-20 – 2021-01-23 (×5): 30 mL via ORAL
  Filled 2021-01-19 (×5): qty 118

## 2021-01-19 MED ORDER — SODIUM CHLORIDE 0.9 % IV BOLUS
1000.0000 mL | Freq: Once | INTRAVENOUS | Status: AC
  Administered 2021-01-19: 1000 mL via INTRAVENOUS

## 2021-01-19 MED ORDER — INSULIN ASPART 100 UNIT/ML IJ SOLN
0.0000 [IU] | Freq: Three times a day (TID) | INTRAMUSCULAR | Status: DC
  Administered 2021-01-19 – 2021-01-20 (×2): 7 [IU] via SUBCUTANEOUS
  Administered 2021-01-20: 9 [IU] via SUBCUTANEOUS
  Administered 2021-01-20 – 2021-01-21 (×2): 7 [IU] via SUBCUTANEOUS
  Administered 2021-01-21: 9 [IU] via SUBCUTANEOUS
  Administered 2021-01-21: 7 [IU] via SUBCUTANEOUS
  Administered 2021-01-22: 5 [IU] via SUBCUTANEOUS
  Administered 2021-01-22: 17:00:00 3 [IU] via SUBCUTANEOUS
  Administered 2021-01-22: 14:00:00 5 [IU] via SUBCUTANEOUS
  Administered 2021-01-23: 12:00:00 7 [IU] via SUBCUTANEOUS
  Administered 2021-01-23: 3 [IU] via SUBCUTANEOUS
  Administered 2021-01-23 – 2021-01-24 (×2): 7 [IU] via SUBCUTANEOUS
  Administered 2021-01-24 – 2021-01-25 (×3): 5 [IU] via SUBCUTANEOUS
  Administered 2021-01-25: 10:00:00 3 [IU] via SUBCUTANEOUS
  Administered 2021-01-25 – 2021-01-28 (×4): 2 [IU] via SUBCUTANEOUS
  Administered 2021-01-28: 13:00:00 5 [IU] via SUBCUTANEOUS
  Administered 2021-01-28: 10:00:00 3 [IU] via SUBCUTANEOUS
  Administered 2021-01-29: 1 [IU] via SUBCUTANEOUS
  Administered 2021-01-29 – 2021-01-31 (×7): 2 [IU] via SUBCUTANEOUS
  Administered 2021-02-01: 1 [IU] via SUBCUTANEOUS
  Administered 2021-02-01 – 2021-02-02 (×2): 2 [IU] via SUBCUTANEOUS
  Filled 2021-01-19 (×35): qty 1

## 2021-01-19 MED ORDER — DOCUSATE SODIUM 100 MG PO CAPS
200.0000 mg | ORAL_CAPSULE | Freq: Every day | ORAL | Status: DC
  Administered 2021-01-20 – 2021-01-28 (×6): 200 mg via ORAL
  Filled 2021-01-19 (×8): qty 2

## 2021-01-19 MED ORDER — LORAZEPAM 0.5 MG PO TABS
0.2500 mg | ORAL_TABLET | Freq: Every day | ORAL | Status: DC
  Administered 2021-01-19 – 2021-02-01 (×14): 0.25 mg via ORAL
  Filled 2021-01-19 (×15): qty 1

## 2021-01-19 MED ORDER — ONDANSETRON HCL 4 MG/2ML IJ SOLN
4.0000 mg | Freq: Once | INTRAMUSCULAR | Status: AC
  Administered 2021-01-19: 4 mg via INTRAVENOUS
  Filled 2021-01-19: qty 2

## 2021-01-19 MED ORDER — SODIUM CHLORIDE 0.9 % IV SOLN: Freq: Once | INTRAVENOUS | Status: AC

## 2021-01-19 MED ORDER — ACETAMINOPHEN 325 MG PO TABS
650.0000 mg | ORAL_TABLET | ORAL | Status: DC | PRN
  Administered 2021-01-21 – 2021-02-02 (×4): 650 mg via ORAL
  Filled 2021-01-19 (×4): qty 2

## 2021-01-19 MED ORDER — DIPHENHYDRAMINE HCL 50 MG/ML IJ SOLN
12.5000 mg | Freq: Three times a day (TID) | INTRAMUSCULAR | Status: DC | PRN
  Administered 2021-01-19: 12.5 mg via INTRAVENOUS
  Filled 2021-01-19: qty 1

## 2021-01-19 MED ORDER — METOPROLOL TARTRATE 5 MG/5ML IV SOLN
2.5000 mg | INTRAVENOUS | Status: DC | PRN
  Administered 2021-01-19: 2.5 mg via INTRAVENOUS
  Filled 2021-01-19: qty 5

## 2021-01-19 MED ORDER — HYDRALAZINE HCL 20 MG/ML IJ SOLN
5.0000 mg | INTRAMUSCULAR | Status: DC | PRN
  Administered 2021-01-19 – 2021-01-24 (×3): 5 mg via INTRAVENOUS
  Filled 2021-01-19 (×3): qty 1

## 2021-01-19 MED ORDER — EMPTY CONTAINERS FLEXIBLE MISC
0.5000 mg/min | Status: DC
  Administered 2021-01-19 – 2021-01-20 (×2): 0.5 mg/min via INTRAVENOUS
  Filled 2021-01-19 (×2): qty 80

## 2021-01-19 MED ORDER — ZOLPIDEM TARTRATE 5 MG PO TABS
5.0000 mg | ORAL_TABLET | Freq: Every evening | ORAL | Status: DC | PRN
  Administered 2021-01-24 – 2021-01-31 (×6): 5 mg via ORAL
  Filled 2021-01-19 (×9): qty 1

## 2021-01-19 MED ORDER — LORATADINE 10 MG PO TABS
10.0000 mg | ORAL_TABLET | Freq: Every day | ORAL | Status: DC
  Administered 2021-01-20 – 2021-02-02 (×13): 10 mg via ORAL
  Filled 2021-01-19 (×16): qty 1

## 2021-01-19 MED ORDER — ASPIRIN EC 325 MG PO TBEC
650.0000 mg | DELAYED_RELEASE_TABLET | Freq: Once | ORAL | Status: AC
  Administered 2021-01-19: 650 mg via ORAL
  Filled 2021-01-19: qty 2

## 2021-01-19 MED ORDER — SENNOSIDES-DOCUSATE SODIUM 8.6-50 MG PO TABS: 1.0000 | ORAL_TABLET | Freq: Every evening | ORAL | Status: DC | PRN

## 2021-01-19 MED ORDER — HEPARIN SODIUM (PORCINE) 5000 UNIT/ML IJ SOLN
5000.0000 [IU] | Freq: Three times a day (TID) | INTRAMUSCULAR | Status: DC
  Administered 2021-01-19 – 2021-02-02 (×39): 5000 [IU] via SUBCUTANEOUS
  Filled 2021-01-19 (×38): qty 1

## 2021-01-19 MED ORDER — ESCITALOPRAM OXALATE 20 MG PO TABS
20.0000 mg | ORAL_TABLET | Freq: Every day | ORAL | Status: DC
  Administered 2021-01-20 – 2021-02-02 (×13): 20 mg via ORAL
  Filled 2021-01-19: qty 2
  Filled 2021-01-19 (×15): qty 1

## 2021-01-19 MED ORDER — METOPROLOL TARTRATE 50 MG PO TABS
75.0000 mg | ORAL_TABLET | Freq: Two times a day (BID) | ORAL | Status: DC
  Administered 2021-01-20 – 2021-01-21 (×3): 75 mg via ORAL
  Filled 2021-01-19 (×3): qty 1

## 2021-01-19 MED ORDER — SODIUM CHLORIDE 0.9% FLUSH: 3.0000 mL | Freq: Once | INTRAVENOUS | Status: DC

## 2021-01-19 MED ORDER — MECLIZINE HCL 25 MG PO TABS
25.0000 mg | ORAL_TABLET | Freq: Once | ORAL | Status: AC
  Administered 2021-01-19: 25 mg via ORAL
  Filled 2021-01-19: qty 1

## 2021-01-19 MED ORDER — CYCLOBENZAPRINE HCL 10 MG PO TABS
5.0000 mg | ORAL_TABLET | Freq: Three times a day (TID) | ORAL | Status: DC
  Administered 2021-01-19 – 2021-02-02 (×39): 5 mg via ORAL
  Filled 2021-01-19 (×40): qty 1

## 2021-01-19 MED ORDER — TICAGRELOR 90 MG PO TABS
90.0000 mg | ORAL_TABLET | Freq: Two times a day (BID) | ORAL | Status: DC
  Administered 2021-01-19 – 2021-02-02 (×27): 90 mg via ORAL
  Filled 2021-01-19 (×31): qty 1

## 2021-01-19 MED ORDER — ACETAMINOPHEN 160 MG/5ML PO SOLN
650.0000 mg | ORAL | Status: DC | PRN
  Administered 2021-01-24: 06:00:00 650 mg
  Filled 2021-01-19 (×3): qty 20.3

## 2021-01-19 MED ORDER — METOPROLOL TARTRATE 5 MG/5ML IV SOLN: 5.0000 mg | Freq: Once | INTRAVENOUS | Status: DC

## 2021-01-19 MED ORDER — INSULIN GLARGINE-YFGN 100 UNIT/ML ~~LOC~~ SOLN
10.0000 [IU] | Freq: Two times a day (BID) | SUBCUTANEOUS | Status: DC
  Administered 2021-01-19 – 2021-01-20 (×3): 10 [IU] via SUBCUTANEOUS
  Filled 2021-01-19 (×4): qty 0.1

## 2021-01-19 NOTE — ED Notes (Signed)
RN at bedside. Pt requesting her at home gabapentin for pain. No order in [facility] at this time. MD messaged.

## 2021-01-19 NOTE — ED Notes (Signed)
Patient vomiting in CT.

## 2021-01-19 NOTE — ED Provider Notes (Signed)
[facility] Emergency Department Provider Note    Event Date/Time   First MD Initiated Contact with Patient 01/19/21 [phone]     (approximate)  I have reviewed the triage vital signs and the nursing notes.   HISTORY  Chief Complaint Code Stroke    HPI Lori [name] [name] is a 50 y.o. female with reported history of multiple previous strokes from out of town presents to the ER for altered mental status reported aphasia and generalized weakness is worse than normal.  Typically able to walk and ambulate with dissidence.  She was last seen normal last night around 10 this morning noted slurred speech.  Patient does state that she is feeling nauseated and did vomit a few times.  Denies any chest pain.  No abdominal pain.  Past Medical History:  Diagnosis Date   CAD (coronary artery disease)    Depression    Diabetes mellitus without complication ([place])    Hypertension    Pheochromocytoma    Pheochromocytoma    Stroke [facility])    Thyroid disease    Family History  Problem Relation Age of Onset   Diabetes Mother    Diabetes Brother    Brain cancer Brother     Patient Active Problem List   Diagnosis Date Noted   Stroke ([place]) 01/19/2021   Sinus tachycardia 01/19/2021   CAD (coronary artery disease) 01/19/2021   Diabetes mellitus without complication ([facility])    Thyroid disease    Hypertension    Pheochromocytoma    Depression    HLD (hyperlipidemia)    Nausea & vomiting       Prior to Admission medications   Medication Sig Start Date End Date Taking? Authorizing Provider  amLODipine (NORVASC) 10 MG tablet Take 10 mg by mouth daily.   Yes [provider]  atorvastatin (LIPITOR) 80 MG tablet Take 80 mg by mouth daily.   Yes [provider]  cetirizine (ZYRTEC) 10 MG tablet Take 10 mg by mouth daily.   Yes [provider]  cyclobenzaprine (FLEXERIL) 5 MG tablet Take 5 mg by mouth 3 (three) times daily.   Yes [provider]  diphenhydrAMINE (BENADRYL) 25 MG tablet Take 25 mg by mouth every 8 (eight) hours as needed for itching.   Yes [provider]  docusate sodium (COLACE) 100 MG capsule Take 200 mg by mouth at bedtime.   Yes [provider]  ergocalciferol (VITAMIN D2) 1.25 MG (50000 UT) capsule Take 50,000 Units by mouth once a week. Monday   Yes [provider]  escitalopram (LEXAPRO) 20 MG tablet Take 20 mg by mouth daily.   Yes [provider]  gabapentin (NEURONTIN) 100 MG capsule Take 200 mg by mouth 3 (three) times daily.   Yes [provider]  insulin detemir (LEVEMIR) 100 unit/ml SOLN Inject 14 Units into the skin 2 (two) times daily.   Yes [provider]  lisinopril (ZESTRIL) 40 MG tablet Take 40 mg by mouth daily.   Yes [provider]  LORazepam (ATIVAN) 0.5 MG tablet Take 0.25 mg by mouth at bedtime.   Yes [provider]  melatonin 3 MG TABS tablet Take 9 mg by mouth at bedtime.   Yes [provider]  metoprolol tartrate (LOPRESSOR) 25 MG tablet Take 75 mg by mouth 2 (two) times daily.   Yes [provider]  omeprazole (PRILOSEC) 20 MG capsule Take 20 mg by mouth daily.   Yes [provider]  polyethylene glycol (MIRALAX / GLYCOLAX) 17 g packet Take 17 g by mouth daily.   Yes [provider]  ticagrelor (BRILINTA) 90 MG TABS tablet Take 90 mg by mouth 2 (two) times daily.   Yes [provider]  traMADol (ULTRAM) 50 MG tablet Take 50 mg by mouth every 6 (six) hours as needed for moderate pain.   Yes [provider]  zolpidem (AMBIEN) 5 MG tablet Take 5 mg by mouth at bedtime as needed for sleep.   Yes [provider]  insulin aspart (NOVOLOG FLEXPEN) 100 UNIT/ML FlexPen Inject 0-10 Units into the skin 4 (four) times daily -  before meals and at bedtime.    [provider]    Allergies Penicillins, Plavix [clopidogrel], and  Sumatriptan    Social History Social History   Tobacco Use   Smoking status: Never   Smokeless tobacco: Never  Substance Use Topics   Alcohol use: Not Currently   Drug use: Never    Review of Systems Patient denies headaches, rhinorrhea, blurry vision, numbness, shortness of breath, chest pain, edema, cough, abdominal pain, nausea, vomiting, diarrhea, dysuria, fevers, rashes or hallucinations unless otherwise stated above in HPI. ____________________________________________   PHYSICAL EXAM:  VITAL SIGNS: Vitals:   01/19/21 1331 01/19/21 1332  BP:  118/72  Pulse: (!) 118 (!) 120  Resp: (!) 23 17  Temp:    SpO2: 96% 96%    Constitutional: Alert, protecting airway Eyes: Conjunctivae are normal.  Head: Atraumatic. Nose: No congestion/rhinnorhea. Mouth/Throat: Mucous membranes are moist.   Neck: No stridor. Painless ROM.  Cardiovascular: tachycardic, regular rhythm. Grossly normal heart sounds.  Good peripheral circulation. Respiratory: Normal respiratory effort.  No retractions. Lungs CTAB. Gastrointestinal: Soft and nontender. No distention. No abdominal bruits. No CVA tenderness. Genitourinary:  Musculoskeletal: No lower extremity tenderness nor edema.  No joint effusions. Neurologic:  aphasic, left sided weakness, able to follow two step commands Skin:  Skin is warm, dry and intact. No rash noted. Psychiatric: Mood and affect are normal. Speech and behavior are normal.  ____________________________________________   LABS (all labs ordered are listed, but only abnormal results are displayed)  Results for orders placed or performed during the hospital encounter of 01/19/21 (from the past 24 hour(s))  Protime-INR     Status: None   Collection Time: 01/19/21  7:43 AM  Result Value Ref Range   Prothrombin Time 13.5 11.4 - 15.2 seconds   INR 1.0 0.8 - 1.2  APTT     Status: None   Collection Time: 01/19/21  7:43 AM  Result Value Ref Range   aPTT 29 24 - 36 seconds   CBC     Status: Abnormal   Collection Time: 01/19/21  7:43 AM  Result Value Ref Range   WBC 8.0 4.0 - 10.5 K/uL   RBC 4.91 3.87 - 5.11 MIL/uL   Hemoglobin 13.0 12.0 - 15.0 g/dL   HCT 39.2 36.0 - 46.0 %   MCV 79.8 (L) 80.0 - 100.0 fL   MCH 26.5 26.0 - 34.0 pg   MCHC 33.2 30.0 - 36.0 g/dL   RDW 14.3 11.5 - 15.5 %   Platelets 366 150 - 400 K/uL   nRBC 0.0 0.0 - 0.2 %  Differential     Status: None   Collection Time: 01/19/21  7:43 AM  Result Value Ref Range   Neutrophils Relative % 74 %   Neutro Abs 5.9 1.7 - 7.7 K/uL   Lymphocytes Relative 19 %  Lymphs Abs 1.5 0.7 - 4.0 K/uL   Monocytes Relative 5 %   Monocytes Absolute 0.4 0.1 - 1.0 K/uL   Eosinophils Relative 1 %   Eosinophils Absolute 0.0 0.0 - 0.5 K/uL   Basophils Relative 0 %   Basophils Absolute 0.0 0.0 - 0.1 K/uL   Immature Granulocytes 1 %   Abs Immature Granulocytes 0.04 0.00 - 0.07 K/uL  Comprehensive metabolic panel     Status: Abnormal   Collection Time: 01/19/21  7:43 AM  Result Value Ref Range   Sodium 132 (L) 135 - 145 mmol/L   Potassium 3.9 3.5 - 5.1 mmol/L   Chloride 96 (L) 98 - 111 mmol/L   CO2 24 22 - 32 mmol/L   Glucose, Bld 336 (H) 70 - 99 mg/dL   BUN 12 6 - 20 mg/dL   Creatinine, Ser 0.70 0.44 - 1.00 mg/dL   Calcium 9.1 8.9 - 10.3 mg/dL   Total Protein 8.0 6.5 - 8.1 g/dL   Albumin 3.7 3.5 - 5.0 g/dL   AST 56 (H) 15 - 41 U/L   ALT 61 (H) 0 - 44 U/L   Alkaline Phosphatase 90 38 - 126 U/L   Total Bilirubin 0.9 0.3 - 1.2 mg/dL   GFR, Estimated >60 >60 mL/min   Anion gap 12 5 - 15  Troponin I (High Sensitivity)     Status: None   Collection Time: 01/19/21  7:43 AM  Result Value Ref Range   Troponin I (High Sensitivity) 5 <18 ng/L  TSH     Status: Abnormal   Collection Time: 01/19/21  7:43 AM  Result Value Ref Range   TSH 0.257 (L) 0.350 - 4.500 uIU/mL  CBG monitoring, ED     Status: Abnormal   Collection Time: 01/19/21  7:45 AM  Result Value Ref Range   Glucose-Capillary 312 (H) 70 - 99  mg/dL  I-stat Creatinine, ED     Status: None   Collection Time: 01/19/21  8:07 AM  Result Value Ref Range   Creatinine, Ser 0.60 0.44 - 1.00 mg/dL  Lactic acid, plasma     Status: Abnormal   Collection Time: 01/19/21  8:50 AM  Result Value Ref Range   Lactic Acid, Venous 2.0 (HH) 0.5 - 1.9 mmol/L  Resp Panel by RT-PCR (Flu A&B, Covid) Nasopharyngeal Swab     Status: None   Collection Time: 01/19/21  9:44 AM   Specimen: Nasopharyngeal Swab; Nasopharyngeal(NP) swabs in vial transport medium  Result Value Ref Range   SARS Coronavirus 2 by RT PCR NEGATIVE NEGATIVE   Influenza A by PCR NEGATIVE NEGATIVE   Influenza B by PCR NEGATIVE NEGATIVE  Lactic acid, plasma     Status: None   Collection Time: 01/19/21 10:02 AM  Result Value Ref Range   Lactic Acid, Venous 1.4 0.5 - 1.9 mmol/L  Urinalysis, Complete w Microscopic     Status: Abnormal   Collection Time: 01/19/21 11:30 AM  Result Value Ref Range   Color, Urine YELLOW YELLOW   APPearance CLEAR CLEAR   Specific Gravity, Urine <1.005 (L) 1.005 - 1.030   pH 5.0 5.0 - 8.0   Glucose, UA >1,000 (A) NEGATIVE mg/dL   Hgb urine dipstick NEGATIVE NEGATIVE   Bilirubin Urine NEGATIVE NEGATIVE   Ketones, ur 80 (A) NEGATIVE mg/dL   Protein, ur NEGATIVE NEGATIVE mg/dL   Nitrite NEGATIVE NEGATIVE   Leukocytes,Ua TRACE (A) NEGATIVE   Squamous Epithelial / LPF 0-5 0 - 5   WBC, UA  6-10 0 - 5 WBC/hpf   RBC / HPF 0-5 0 - 5 RBC/hpf   Bacteria, UA RARE (A) NONE SEEN   Mucus PRESENT   Troponin I (High Sensitivity)     Status: None   Collection Time: 01/19/21 11:30 AM  Result Value Ref Range   Troponin I (High Sensitivity) 5 <18 ng/L  Urine Drug Screen, Qualitative ([facility] only)     Status: Abnormal   Collection Time: 01/19/21 11:30 AM  Result Value Ref Range   Tricyclic, Ur Screen POSITIVE (A) NONE DETECTED   Amphetamines, Ur Screen NONE DETECTED NONE DETECTED   MDMA (Ecstasy)Ur Screen NONE DETECTED NONE DETECTED   Cocaine Metabolite,Ur [place] NONE  DETECTED NONE DETECTED   Opiate, Ur Screen NONE DETECTED NONE DETECTED   Phencyclidine (PCP) Ur S NONE DETECTED NONE DETECTED   Cannabinoid 50 Ng, Ur [place] NONE DETECTED NONE DETECTED   Barbiturates, Ur Screen NONE DETECTED NONE DETECTED   Benzodiazepine, Ur Scrn NONE DETECTED NONE DETECTED   Methadone Scn, Ur NONE DETECTED NONE DETECTED  CBG monitoring, ED     Status: Abnormal   Collection Time: 01/19/21  1:46 PM  Result Value Ref Range   Glucose-Capillary 285 (H) 70 - 99 mg/dL   ____________________________________________  EKG My review and personal interpretation at Time: 8:47   Indication: weakness  Rate: 145  Rhythm: sinus Axis: normal Other: nonspecific st abn likely rate dependent ____________________________________________  RADIOLOGY  I personally reviewed all radiographic images ordered to evaluate for the above acute complaints and reviewed radiology reports and findings.  These findings were personally discussed with the patient.  Please see medical record for radiology report.  ____________________________________________   PROCEDURES  Procedure(s) performed:  Procedures    Critical Care performed: no ____________________________________________   INITIAL IMPRESSION / ASSESSMENT AND PLAN / ED COURSE  Pertinent labs & imaging results that were available during my care of the patient were reviewed by me and considered in my medical decision making (see chart for details).   DDX: Dehydration, sepsis, pna, uti, hypoglycemia, cva, drug effect, withdrawal, encephalitis   Maritza Neclos [name] is a 50 y.o. who presents to the ED with presentation as described above arrives via EMS as a code stroke the last seen normal was last night.  CT imaging shows no acute abnormality.  Patient evaluated by neurology.  On my exam I have a lower suspicion for CVA I think this is more likely metabolic possible sepsis or dehydration particular given her tachycardia.  Given IV fluids.   Her abdominal exam is soft and benign.  She denies any chest pain or pressure.  Clinical Course as of 01/19/21 1513  Fri Jan 19, 2021  2992 Lactate mildly elevated at 2.  Heart rate is downtrending after IV fluids now 140s.  We will continue additional IVF. [PR]  1049 Patient with persistent tachycardia.  This was to be on metoprolol.  After discussion with neurology I do feel that benefits of rate control outweigh risks in this setting.  Will give IV Lopressor and if able to tolerate and passed swallow screen will restart oral metop. [PR]    Clinical Course User Index [PR] Merlyn Lot, MD    The patient was evaluated in Emergency Department today for the symptoms described in the history of present illness. He/she was evaluated in the context of the global COVID-19 pandemic, which necessitated consideration that the patient might be at risk for infection with the SARS-CoV-2 virus that causes COVID-19. Institutional protocols and algorithms  that pertain to the evaluation of patients at risk for COVID-19 are in a state of rapid change based on information released by regulatory bodies including the CDC and federal and state organizations. These policies and algorithms were followed during the patient's care in the ED.  As part of my medical decision making, I reviewed the following data within the [place] notes reviewed and incorporated, Labs reviewed, notes from prior ED visits and [place] Controlled Substance Database   ____________________________________________   FINAL CLINICAL IMPRESSION(S) / ED DIAGNOSES  Final diagnoses:  Aphasia  Tachycardia      NEW MEDICATIONS STARTED DURING THIS VISIT:  New Prescriptions   No medications on file     Note:  This document was prepared using Dragon voice recognition software and may include unintentional dictation errors.    Merlyn Lot, MD 01/19/21 [phone]

## 2021-01-19 NOTE — Consult Note (Addendum)
NEURO HOSPITALIST CONSULT NOTE   Requesting physician: Dr. Quentin Cornwall  Reason for Consult: Acute onset of aphasia, inability to ambulate and projectile vomiting  History obtained from:  EMS, RN and Chart     HPI:                                                                                                                                          Lori [name] is an 50 y.o. 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 LKN was yesterday at 10:00 PM, when she went to bed. On awakening at 4 AM, she started to vomit. By 6 AM, family noted her speech to be slurred and called EMS. On EMS arrival, she could not stand on her own for transfer. EMS knows her well and stated that she normally can stand and pivot to a walker.   Patient is nonverbal during assessment, but makes signs with her hand that she is not able to walk. After CT head was obtained, she had one bout of projectile vomiting.   CT head: No acute finding. 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   PMHx: Morbid obesity Multiple prior strokes Chronic left sided weakness from prior stroke DM for the past 11 years HTN  No family history on file.            Social History:  has no history on file for tobacco use, alcohol use, and drug use.  Not on File  MEDICATIONS:                                                                                                                     Not listed in Epic  Allergies Plavix Penicillins Sumatriptan  ROS:  Unable to obtain due to mutism.   There were no vitals taken  for this visit.   General Examination:                                                                                                       Physical Exam  HEENT-  [place]/AT    Lungs- Respirations unlabored Extremities- Warm and well perfused  Neurological Examination Mental Status: Awake and alert. Mute without any verbal output. Able to write down short replies to questions (1-3 word replies). Able to correctly identify several objects (writes replies) on NIHSS card but could not identify a feather or a key. When asked to write "no ifs, ands or buts", she does so incorrectly. Able to follow all commands. Writes down her age and the month correctly.  Cranial Nerves: II: Visual fields intact bilaterally. PERRL 5 mm >> 3 mm.   III,IV, VI: No ptosis. Eyes are conjugated. No nystagmus. Can track a moving object to the right and left, but has some difficulty with leftward gaze. Saccadic quality of pursuits is noted.  V: Temp sensation equal bilaterally VII: RIGHT facial droop (lower quadrant).  VIII: Hearing intact to questions and commands IX,X: Deferred due to recent vomiting XI: Head is midlline XII: Midline tongue extension Motor: LUE: Flexion contractures at elbow, wrist and digits. Arm adducted at shoulder. Increased tone. Can elevate forearm and elbow a few cm off of her chest to command, but with difficulty. LLE: Increased tone. Falls to bed after passive elevation and release, with slight effort against gravity. In the context of increased tone, there is 3/5 strength when testing knee extension and knee flexion. Spontaneous clonus to left foot intermittently. Sustained clonus with passive dorsiflexion by examiner.  RUE 4+/5 without drift RLE 4+/5 without drift after coaching. Spontaneous clonus to right foot as well as passive-dorsiflexion induced clonus.  Sensory: Hyperesthesia to temp LUE. Normal temp sensation to RUE and BLE.  Deep Tendon Reflexes: Hyperactive throughout. Crossed  adductor responses bilaterally when assessing patellar reflexes.  Plantars: Right: downgoing  Left: downgoing Cerebellar: No ataxia disproportionate to weakness with LUE and RUE FNF testing.  Gait: Unable to assess.   Lab Results: Basic Metabolic Panel: No results for input(s): NA, K, CL, CO2, GLUCOSE, BUN, CREATININE, CALCIUM, MG, PHOS in the last 168 hours.  CBC: No results for input(s): WBC, NEUTROABS, HGB, HCT, MCV, PLT in the last 168 hours.  Cardiac Enzymes: No results for input(s): CKTOTAL, CKMB, CKMBINDEX, TROPONINI in the last 168 hours.  Lipid Panel: No results for input(s): CHOL, TRIG, HDL, CHOLHDL, VLDL, LDLCALC in the last 168 hours.  Imaging: CT HEAD CODE STROKE WO CONTRAST  Result Date: 01/19/2021 CLINICAL DATA:  Code stroke. Generalized weakness. Aphasia and left-sided deficits from prior stroke EXAM: CT HEAD WITHOUT CONTRAST TECHNIQUE: Contiguous axial images were obtained from the base of the skull through the vertex without intravenous contrast. COMPARISON:  None. FINDINGS: Brain: Confluent low-density in the cerebral white matter with superimposed chronic lacunar infarcts at the deep gray nuclei and deep white matter tracks. Remote left cerebellar  infarction which is moderate to extensive. Premature brain atrophy. No acute hemorrhage, hydrocephalus, collection, or masslike finding. Brain atrophy especially affecting the cerebellum. Vascular: No hyperdense vessel. Premature atheromatous calcification. Skull: Normal. Negative for fracture or focal lesion. Sinuses/Orbits: No acute finding. Other: These results were called by telephone at the time of interpretation on 01/19/2021 at 7:59 am to provider Merlyn Lot , who verbally acknowledged these results. ASPECTS [facility] Stroke Program Early CT Score) No acute infarct. IMPRESSION: 1. No acute finding. 2. 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 Electronically Signed   By: Jorje Guild M.D.   On:  01/19/2021 08:01     Assessment: 50 year old female presenting with acute onset of mutism and BLE weakness. Also with intermittent vomiting.  1. Exam reveals findings referable to the old right basal ganglia lacunar infarct seen on CT head, as well as dense expressive aphasia. NIHSS 15, a substantial portion of which is attributable to her chronic left sided deficits.  2. CT head: No acute finding. 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 3. CTA of head and neck: Generalized and severe atherosclerosis, especially for age. Most severe abnormalities are in the posterior circulation where there are left vertebral and basilar occlusions which are age indeterminate, as well as severe right V4 segment stenosis. No flow limiting stenosis in the cervical carotid circulation. Probable flow limiting stenosis at the carotid siphons, especially on the right. 4. Not a TNK candidate due to time criteria.  5. Risks/benefits of potential thrombectomy with stenting were discussed with Dr. Margarita Sermons. Risks of endovascular procedure include potential locked in syndrome and death, with potential benefit of short and longer-term reduction of stroke recurrence risk as well as possible improvement of her lower extremity weakness and aphasia. Not proceeding with endovsacular procedure could also result in severe disability or death should the current stroke progress. This information was then conveyed to the patient, who refused intervention. The patient is awake, alert, with good insight and able to engage in medical decision making based on her written responses to questions during the exam.   Recommendations: 1. Permissive HTN x 24 hours.  2. MRI of the brain without contrast 3. TTE  4. Cardiac telemetry 5. Frequent neuro checks 6. Will need Pharmacy assistance to obtain a full list of her  home medications 7. Will need to obtain family contact information to go over patient's PMHx in detail (no number listed in Epic and she is no prior visits to the [facility] as well as no information in Care Everywhere).  8. HgbA1c, fasting lipid panel 9. PT consult, OT consult, Speech consult 10. 40 mg atorvastatin po qd provided that she has no allergy to statins.  11. 650 mg crushed ASA x 1 now (ordered), then ASA 81 mg po qd. Has an allergy to clopidogrel, so unable to prescribe DAPT with this medication.   12. Risk factor modification 13. NPO until passes stroke swallow screen 14. Ticagrelor may be a viable substitute for Plavix in instituting DAPT. Of note, ticagrelor is a more potent antiplatelet agent than Plavix, and is associated with a higher risk of bleeding. This drug is also quite expensive. If her insurance covers it, overall benefits are felt to outweigh risks, given her severe intracranial atherosclerotic disease.  15. IVF   Electronically signed: Dr. Kerney Elbe 01/19/2021, 8:21 AM

## 2021-01-19 NOTE — ED Notes (Signed)
Pt vomiting. Asking for peptobismol,

## 2021-01-19 NOTE — ED Triage Notes (Addendum)
Pt to ER via [facility] from home. LKW seen by family at 33, 01/18/21. Pt with complaints of asphasia that started at 6am, generalized weakness that started last night at 10pm. Pt able to perform ADLs typically independently with assistance of a walker but today was unable to walk due to weakness.Pt also woke up at 4am vomiting. Pt unable to speak at this time, able to follow commands appropriately and communicate needs through gestures.   Pt with significant stroke hx and deficits on left side. Left arm/ hand contracted.

## 2021-01-19 NOTE — ED Notes (Addendum)
Pt communicating to this RN via pen and paper.   Pt writes that she has been home 5-6 days from an extended inpatient admission in a facility that is four hours away.Reports having a stroke on veteran's day this year, was told she had a brain bleed. Writes that the hospital name is "[facility]".

## 2021-01-19 NOTE — ED Notes (Signed)
Patient transported to CT 

## 2021-01-19 NOTE — ED Notes (Signed)
Pt requesting pm meds even though they are nauseated

## 2021-01-19 NOTE — H&P (Signed)
History and Physical    Randye Treichler TJQ:[id number] DOB: 1970/05/12 DOA: 01/19/2021  Referring MD/NP/PA:   PCP: Pcp, No   Patient coming from:  The patient is coming from home  Chief Complaint: Aphasia, nausea, vomiting  HPI: Lori [name] is a 50 y.o. female with medical history significant of hypertension, hyperlipidemia, diabetes mellitus, stroke with left-sided weakness, thyroid disease (patient does not know which type of thyroid disease), depression with anxiety, CAD, s/p of CABG, pheochromocytoma (s/p of right adrenal gland removal per patient), who presents with aphasia, nausea and vomiting.  Patient has history of stroke with left-sided weakness.  At her normal baseline, patient is able to perform ADLs, typically independently with assistance of a walker.  Patient was last known normal at 10 PM last night 01/18/21. Today she started having difficult speaking at about 6 AM.  She also reports bilateral leg weakness, cannot walk due to weakness.  Patient denies chest pain, cough, shortness of breath.  No fever or chills.  No symptoms of UTI.  She states that she has nausea, and has vomited at least 8 times with bilious and greenish colored vomitus.  Denies diarrhea or abdominal pain.  Patient was found to have sinus tachycardia with heart rate up to 160s, patient was given IV metoprolol in the ED.  Heart rate improved to 120s.  ED Course: pt was found to have WBC 8.0, lactic acid 2.0, 1.4, troponin level 5, TSH 0.257, urinalysis not impressive (clear appearance, trace amount of leukocyte, rare bacteria, WBC 6-10), electrolytes renal function okay, temperature normal, blood pressure 172/158, 163/109, RR 28, oxygen saturation 96% on room air.  Chest x-ray negative.  CT of head is negative for acute intracranial abnormalities.  Patient is admitted to progressive bed as inpatient.  Dr. Cheral Marker of neurology is consulted  CTA of head and neck: 1. Generalized and severe  atherosclerosis, especially for age. 2. Dominant findings in the posterior circulation where there are left vertebral and basilar occlusions which are age indeterminate. Severe right V4 segment stenosis. 3. No flow limiting stenosis in the cervical carotid circulation. 4. Probable flow limiting stenosis at the carotid siphons, especially on the right.   Review of Systems:   General: no fevers, chills, no body weight gain, has poor appetite, has fatigue HEENT: no blurry vision, hearing changes or sore throat Respiratory: no dyspnea, coughing, wheezing CV: no chest pain, no palpitations GI: has nausea, vomiting, no abdominal pain, diarrhea, constipation GU: no dysuria, burning on urination, increased urinary frequency, hematuria  Ext: no leg edema Neuro:  no vision change or hearing loss. Has left-sided weakness.  Has aphasia. Skin: no rash, no skin tear. MSK: No muscle spasm, no deformity, no limitation of range of movement in spin Heme: No easy bruising.  Travel history: No recent long distant travel.  Allergy:  Allergies  Allergen Reactions   Penicillins    Plavix [Clopidogrel]    Sumatriptan     Past Medical History:  Diagnosis Date   CAD (coronary artery disease)    Depression    Diabetes mellitus without complication ([place])    Hypertension    Pheochromocytoma    Pheochromocytoma    Stroke [facility])    Thyroid disease     Past Surgical History:  Procedure Laterality Date   CORONARY ARTERY BYPASS GRAFT     Right adrenal gland removal for pheochromocytoma Right     Social History:  reports that she has never smoked. She has never used smokeless tobacco.  She reports that she does not currently use alcohol. She reports that she does not use drugs.  Family History:  Family History  Problem Relation Age of Onset   Diabetes Mother    Diabetes Brother    Brain cancer Brother      Prior to Admission medications   Not on File    Physical Exam: Vitals:   01/19/21  1100 01/19/21 1130 01/19/21 1200 01/19/21 1230  BP: (!) 159/99 (!) 155/130 (!) 159/103 (!) 168/116  Pulse: (!) 144 (!) 117 (!) 119 (!) 119  Resp: (!) 27 18 17  (!) 23  Temp:      TempSrc:      SpO2: 97% 96% 96% 97%  Weight:       General: Not in acute distress HEENT:       Eyes: PERRL, EOMI, no scleral icterus.       ENT: No discharge from the ears and nose, no pharynx injection, no tonsillar enlargement.        Neck: No JVD, no bruit, no mass felt. Heme: No neck lymph node enlargement. Cardiac: S1/S2, RRR, No murmurs, No gallops or rubs. Respiratory: No rales, wheezing, rhonchi or rubs. GI: Soft, nondistended, nontender, no rebound pain, no organomegaly, BS present. GU: No hematuria Ext: No pitting leg edema bilaterally. 1+DP/PT pulse bilaterally. Musculoskeletal: No joint deformities, No joint redness or warmth, no limitation of ROM in spin. Skin: No rashes.  Neuro: Alert, oriented X3, cranial nerves II-XII grossly intact except for difficulty speaking, has chronic left-sided weakness, left arm is contracted. Psych: Patient is not psychotic, no suicidal or hemocidal ideation.  Labs on Admission: I have personally reviewed following labs and imaging studies  CBC: Recent Labs  Lab 01/19/21 0743  WBC 8.0  NEUTROABS 5.9  HGB 13.0  HCT 39.2  MCV 79.8*  PLT 220   Basic Metabolic Panel: Recent Labs  Lab 01/19/21 0743 01/19/21 0807  NA 132*  --   K 3.9  --   CL 96*  --   CO2 24  --   GLUCOSE 336*  --   BUN 12  --   CREATININE 0.70 0.60  CALCIUM 9.1  --    GFR: CrCl cannot be calculated (Unknown ideal weight.). Liver Function Tests: Recent Labs  Lab 01/19/21 0743  AST 56*  ALT 61*  ALKPHOS 90  BILITOT 0.9  PROT 8.0  ALBUMIN 3.7   No results for input(s): LIPASE, AMYLASE in the last 168 hours. No results for input(s): AMMONIA in the last 168 hours. Coagulation Profile: Recent Labs  Lab 01/19/21 0743  INR 1.0   Cardiac Enzymes: No results for input(s):  CKTOTAL, CKMB, CKMBINDEX, TROPONINI in the last 168 hours. BNP (last 3 results) No results for input(s): PROBNP in the last 8760 hours. HbA1C: No results for input(s): HGBA1C in the last 72 hours. CBG: Recent Labs  Lab 01/19/21 0745  GLUCAP 312*   Lipid Profile: No results for input(s): CHOL, HDL, LDLCALC, TRIG, CHOLHDL, LDLDIRECT in the last 72 hours. Thyroid Function Tests: Recent Labs    01/19/21 0743  TSH 0.257*   Anemia Panel: No results for input(s): VITAMINB12, FOLATE, FERRITIN, TIBC, IRON, RETICCTPCT in the last 72 hours. Urine analysis:    Component Value Date/Time   COLORURINE YELLOW 01/19/2021 1130   APPEARANCEUR CLEAR 01/19/2021 1130   LABSPEC <1.005 (L) 01/19/2021 1130   PHURINE 5.0 01/19/2021 1130   GLUCOSEU >1,000 (A) 01/19/2021 1130   HGBUR NEGATIVE 01/19/2021 1130   BILIRUBINUR NEGATIVE  01/19/2021 1130   KETONESUR 80 (A) 01/19/2021 1130   PROTEINUR NEGATIVE 01/19/2021 1130   NITRITE NEGATIVE 01/19/2021 1130   LEUKOCYTESUR TRACE (A) 01/19/2021 1130   Sepsis Labs: @LABRCNTIP (procalcitonin:4,lacticidven:4) ) Recent Results (from the past 240 hour(s))  Resp Panel by RT-PCR (Flu A&B, Covid) Nasopharyngeal Swab     Status: None   Collection Time: 01/19/21  9:44 AM   Specimen: Nasopharyngeal Swab; Nasopharyngeal(NP) swabs in vial transport medium  Result Value Ref Range Status   SARS Coronavirus 2 by RT PCR NEGATIVE NEGATIVE Final    Comment: (NOTE) SARS-CoV-2 target nucleic acids are NOT DETECTED.  The SARS-CoV-2 RNA is generally detectable in upper respiratory specimens during the acute phase of infection. The lowest concentration of SARS-CoV-2 viral copies this assay can detect is 138 copies/mL. A negative result does not preclude SARS-Cov-2 infection and should not be used as the sole basis for treatment or other patient management decisions. A negative result may occur with  improper specimen collection/handling, submission of specimen other than  nasopharyngeal swab, presence of viral mutation(s) within the areas targeted by this assay, and inadequate number of viral copies(<138 copies/mL). A negative result must be combined with clinical observations, patient history, and epidemiological information. The expected result is Negative.  Fact Sheet for Patients:  EntrepreneurPulse.com.au  Fact Sheet for Healthcare Providers:  IncredibleEmployment.be  This test is no t yet approved or cleared by the [place] FDA and  has been authorized for detection and/or diagnosis of SARS-CoV-2 by FDA under an Emergency Use Authorization (EUA). This EUA will remain  in effect (meaning this test can be used) for the duration of the COVID-19 declaration under Section 564(b)(1) of the Act, 21 U.S.C.section 360bbb-3(b)(1), unless the authorization is terminated  or revoked sooner.       Influenza A by PCR NEGATIVE NEGATIVE Final   Influenza B by PCR NEGATIVE NEGATIVE Final    Comment: (NOTE) The Xpert Xpress SARS-CoV-2/FLU/RSV plus assay is intended as an aid in the diagnosis of influenza from Nasopharyngeal swab specimens and should not be used as a sole basis for treatment. Nasal washings and aspirates are unacceptable for Xpert Xpress SARS-CoV-2/FLU/RSV testing.  Fact Sheet for Patients: EntrepreneurPulse.com.au  Fact Sheet for Healthcare Providers: IncredibleEmployment.be  This test is not yet approved or cleared by the [place] FDA and has been authorized for detection and/or diagnosis of SARS-CoV-2 by FDA under an Emergency Use Authorization (EUA). This EUA will remain in effect (meaning this test can be used) for the duration of the COVID-19 declaration under Section 564(b)(1) of the Act, 21 U.S.C. section 360bbb-3(b)(1), unless the authorization is terminated or revoked.  Performed at [facility], [address] [place],   [address]      Radiological Exams on Admission: DG Chest [facility] 1 View  Result Date: 01/19/2021 CLINICAL DATA:  Sepsis. EXAM: PORTABLE CHEST 1 VIEW COMPARISON:  None. FINDINGS: The heart size and mediastinal contours are within normal limits. Both lungs are clear. Sternotomy wires are noted. The visualized skeletal structures are unremarkable. IMPRESSION: No active disease. Electronically Signed   By: Marijo Conception M.D.   On: 01/19/2021 09:18   CT HEAD CODE STROKE WO CONTRAST  Result Date: 01/19/2021 CLINICAL DATA:  Code stroke. Generalized weakness. Aphasia and left-sided deficits from prior stroke EXAM: CT HEAD WITHOUT CONTRAST TECHNIQUE: Contiguous axial images were obtained from the base of the skull through the vertex without intravenous contrast. COMPARISON:  None. FINDINGS: Brain: Confluent low-density in the cerebral white  matter with superimposed chronic lacunar infarcts at the deep gray nuclei and deep white matter tracks. 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 Premature brain atrophy. No acute hemorrhage, hydrocephalus, collection, or masslike finding. Brain atrophy especially affecting the cerebellum. Vascular: No hyperdense vessel. Premature atheromatous calcification. Skull: Normal. Negative for fracture or focal lesion. Sinuses/Orbits: No acute finding. Other: These results were called by telephone at the time of interpretation on 01/19/2021 at 7:59 am to provider Merlyn Lot , who verbally acknowledged these results. ASPECTS [facility] Stroke Program Early CT Score) No acute infarct. IMPRESSION: 1. No acute finding. 2. 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 Electronically Signed   By: Jorje Guild M.D.   On: 01/19/2021 08:01   CT ANGIO HEAD CODE STROKE  Result Date: 01/19/2021 CLINICAL DATA:  Generalized weakness with aphasia. Left-sided deficits. Vomiting. EXAM: CT ANGIOGRAPHY HEAD AND NECK TECHNIQUE: Multidetector CT imaging of the  head and neck was performed using the standard protocol during bolus administration of intravenous contrast. Multiplanar CT image reconstructions and MIPs were obtained to evaluate the vascular anatomy. Carotid stenosis measurements (when applicable) are obtained utilizing NASCET criteria, using the distal internal carotid diameter as the denominator. CONTRAST:  34mL OMNIPAQUE IOHEXOL 350 MG/ML SOLN COMPARISON:  None. FINDINGS: CTA NECK FINDINGS Aortic arch: Atheromatous plaque. 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 No dissection or ulceration. Left carotid system: Age advanced atheromatous plaque along the common carotid and proximal ICA without flow limiting stenosis or ulceration. Vertebral arteries: Aberrant right subclavian artery. Significant for age atheromatous plaque at the bilateral proximal subclavian, but without flow limiting stenosis. Heavily diseased vertebral arteries with extensive calcific plaque and multifocal narrowing. The origins are difficult to assess due to small vessel size, calcified plaque, and body habitus. Both vertebral arteries are patent at the dura although there is subsequent occlusion on the left. Skeleton: No acute finding Other neck: No acute finding Upper chest: No acute finding Review of the MIP images confirms the above findings CTA HEAD FINDINGS Anterior circulation: Heavily diseased carotid siphon with calcified plaque and multifocal luminal stenosis. The degree of calcified plaque limits lumen measurement, expect flow limiting stenosis on both sides. 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 Extensive atheromatous plaque affecting the M1 segments and MCA branches. Moderate narrowing at the bilateral M1 segment. Negative for aneurysm. Posterior circulation:  Heavily diseased vertebral arteries with calcified plaque. Severe right vertebral stenosis beyond the PICA. Left proximal V4 segment occlusion with potentially retrograde flow seen at the proximal left PICA. Narrow basilar diffusely with 2 segments of non enhancement. Fetal type bilateral PCA flow. Venous sinuses: No emergent finding Anatomic variants: As above Review of the MIP images confirms the above findings Critical Value/emergent results were called by telephone at the time of interpretation on 01/19/2021 at 8:42 am to provider ERIC [facility] , who verbally acknowledged these results. IMPRESSION: 1. Generalized and severe atherosclerosis, especially for age. 2. Dominant findings in the posterior circulation where there are left vertebral and basilar occlusions which are age indeterminate. Severe right V4 segment stenosis. 3. No flow limiting stenosis in the cervical carotid circulation. 4. Probable flow limiting stenosis at the carotid siphons, especially on the right. Electronically Signed   By: Jorje Guild M.D.   On: 01/19/2021 08:50   CT ANGIO NECK  CODE STROKE  Result Date: 01/19/2021 CLINICAL DATA:  Generalized weakness with aphasia. Left-sided deficits. Vomiting. EXAM: CT ANGIOGRAPHY HEAD AND NECK TECHNIQUE: Multidetector CT imaging of the head and neck was performed using the standard protocol during bolus administration of intravenous contrast. Multiplanar CT image reconstructions and MIPs were obtained to evaluate the vascular anatomy. Carotid stenosis measurements (when applicable) are obtained utilizing NASCET criteria, using the distal internal carotid diameter as the denominator. CONTRAST:  49mL OMNIPAQUE IOHEXOL 350 MG/ML SOLN COMPARISON:  None. FINDINGS: CTA NECK FINDINGS Aortic arch: Atheromatous plaque. 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 No dissection or ulceration. Left carotid system: Age advanced atheromatous plaque along the common carotid and proximal ICA without flow limiting stenosis or ulceration. Vertebral arteries: Aberrant right subclavian artery. Significant for age atheromatous plaque at the bilateral proximal subclavian, but without flow limiting stenosis. Heavily diseased vertebral arteries with extensive calcific plaque and multifocal narrowing. The origins are difficult to assess due to small vessel size, calcified plaque, and body habitus. Both vertebral arteries are patent at the dura although there is subsequent occlusion on the left. Skeleton: No acute finding Other neck: No acute finding Upper chest: No acute finding Review of the MIP images confirms the above findings CTA HEAD FINDINGS Anterior circulation: Heavily diseased carotid siphon with calcified plaque and multifocal luminal stenosis. The degree of calcified plaque limits lumen measurement, expect flow limiting stenosis on both sides. 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 Extensive atheromatous plaque affecting the M1 segments and MCA branches. Moderate narrowing at the bilateral M1 segment. Negative for aneurysm. Posterior circulation: Heavily diseased vertebral arteries with calcified plaque. Severe right vertebral stenosis beyond the PICA. Left proximal V4 segment occlusion with potentially retrograde flow seen at the proximal left PICA. Narrow basilar diffusely with 2 segments of non enhancement. Fetal type bilateral PCA flow. Venous sinuses: No emergent finding Anatomic variants: As above Review of the MIP images confirms the above findings Critical Value/emergent results were called by telephone at the time of interpretation on 01/19/2021 at 8:42 am to provider ERIC [facility] , who verbally acknowledged these results. IMPRESSION: 1. Generalized and severe atherosclerosis, especially for  age. 2. Dominant findings in the posterior circulation where there are left vertebral and basilar occlusions which are age indeterminate. Severe right V4 segment stenosis. 3. No flow limiting stenosis in the cervical carotid circulation. 4. Probable flow limiting stenosis at the carotid siphons, especially on the right. Electronically Signed   By: Jorje Guild M.D.   On: 01/19/2021 08:50     EKG: I have personally reviewed.  Sinus rhythm, tachycardia, QTC 546, heart rate 147, nonspecific T wave change  Assessment/Plan Principal Problem:   Stroke [facility]) Active Problems:   Sinus tachycardia   Diabetes mellitus without complication (H[facility])   Thyroid disease   Hypertension   CAD (coronary artery disease)   Pheochromocytoma   Depression   HLD (hyperlipidemia)   Nausea & vomiting   Stroke [facility]): Patient's symptoms likely due to new stroke.  CT head negative.  Dr. Cheral Marker of neurology is consulted, who recommended stroke work-up.  -Admitted to progressive unit as inpatient - Obtain MRI - allow permissive HTN in the setting of acute stroke - Continue ASA and Brillinta - Lipitor - fasting lipid panel and HbA1c  - 2D transthoracic echocardiography  -  swallowing screen. If fails, will get SLP - Check UDS  - PT/OT consult  Sinus tachycardia: Patient heart rate was up to 160s.  Etiology is not clear.  Patient has history of thyroid disease, but she does not know which type of thyroid disease.  Patient is not taking medications.  TSH is 0.257, therefore may have contributed partially.  Patient has history of pheochromocytoma, but the patient is s/p of adrenal gland removal, less likely to play a role here. -Continue home metoprolol 75 mg twice daily -As needed IV metoprolol  Thyroid disease: TSH 0.257 -f/u free T4 and T3  Diabetes mellitus without complication [facility]): Patient is taking Levemir 14 units twice daily at home -SSI -Glargine insulin 10 unit twice daily -Follow-up  A1c  Hypertension -- will hold amlodipine, lisinopril to allow permissive HTN in the setting of acute stroke -IV hydralazine as needed for SBP>220 or dBP>120  CAD (coronary artery disease): S/p of CABG -Aspirin, Lipitor, Brilinta  Pheochromocytoma: S/p of right adrenal gland removal surgery -No acute issues  Depression -Continue home medications  HLD (hyperlipidemia) -Lipitor   Nausea & vomiting: Etiology is not clear.  Patient does not have abdominal pain.  CT scan did not show acute intra-abdominal issues. -Supportive care -IV fluid -As needed Benadryl  CT-abd/pelvis: Patient appears to be status post hysterectomy. 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   Small sliding-type hiatal hernia.   Aortic Atherosclerosis (ICD10-I70.0).      DVT ppx: SQ Heparin  Code Status: Full code Family Communication: Patient gave me her daughter's number [phone].  I called this number, nobody picked up the phone. I left a message. Disposition Plan:  Anticipate discharge to rehab Consults called:  Dr. Cheral Marker of neuro Admission status and  Level of care: Progressive:   as inpt      Status is: Inpatient  Remains inpatient appropriate because: Patient has multiple comorbidities, including stroke with left-sided weakness, now presents with aphasia.  Highly suspecting new stroke.  Patient also has severe sinus bradycardia, with heart rate up to 160s.  She also has nausea and vomiting which needs further work-up and IVF. Her presentation is highly complicated.  Patient is at high risk of deteriorating.  Need to be treated in the hospital for at least 2 days          Date of Service 01/19/2021    [facility]   If 7PM-7AM, please contact night-coverage [email or web address] 01/19/2021, 1:15 PM

## 2021-01-19 NOTE — ED Notes (Signed)
Pt vomitted. PT gown changed

## 2021-01-19 NOTE — ED Notes (Addendum)
Pt vomitting. Pt asking for water

## 2021-01-19 NOTE — Progress Notes (Signed)
°   01/19/21 0745  Clinical Encounter Type  Visited With Patient  Visit Type Initial;Spiritual support;Social support;Code  Referral From Nurse  Consult/Referral To Chaplain   Chaplain checked in on PT and medical staff informed me that it was all under control. On-call chaplain available if follow-up needed.   Andee Poles, MDiv

## 2021-01-19 NOTE — ED Notes (Signed)
Katie RN aware of assigned bed

## 2021-01-20 ENCOUNTER — Inpatient Hospital Stay: Payer: Medicare Other

## 2021-01-20 DIAGNOSIS — I639 Cerebral infarction, unspecified: Secondary | ICD-10-CM | POA: Diagnosis not present

## 2021-01-20 LAB — LIPASE, BLOOD: Lipase: 110 U/L — ABNORMAL HIGH (ref 11–51)

## 2021-01-20 LAB — LIPID PANEL
Cholesterol: 165 mg/dL (ref 0–200)
HDL: 54 mg/dL (ref >40)
LDL Cholesterol: 95 mg/dL (ref 0–99)
Total CHOL/HDL Ratio: 3.1 RATIO
Triglycerides: 81 mg/dL (ref <150)
VLDL: 16 mg/dL (ref 0–40)

## 2021-01-20 LAB — GLUCOSE, CAPILLARY
Glucose-Capillary: 328 mg/dL — ABNORMAL HIGH (ref 70–99)
Glucose-Capillary: 367 mg/dL — ABNORMAL HIGH (ref 70–99)
Glucose-Capillary: 329 mg/dL — ABNORMAL HIGH (ref 70–99)
Glucose-Capillary: 252 mg/dL — ABNORMAL HIGH (ref 70–99)

## 2021-01-20 LAB — HIV ANTIBODY (ROUTINE TESTING W REFLEX): HIV Screen 4th Generation wRfx: NONREACTIVE

## 2021-01-20 LAB — T4, FREE: Free T4: 1.16 ng/dL — ABNORMAL HIGH (ref 0.61–1.12)

## 2021-01-20 IMAGING — MR MR HEAD W/O CM
11 series · 48 of 48 positions shown · non-contrast
Comparison: CT/CTA head and neck dated 1 day prior

CLINICAL DATA: Left-sided weakness, stroke suspected

EXAM:
MRI HEAD WITHOUT CONTRAST
TECHNIQUE: Multiplanar, multiecho pulse sequences of the brain and surrounding
structures were obtained without intravenous contrast.

[Series 5: ax dwi_tracew · axial · 3.0mm · 0.71mm/px · z∈[-76,+77]mm · 5 of 52 slices shown]
[im 1/52]
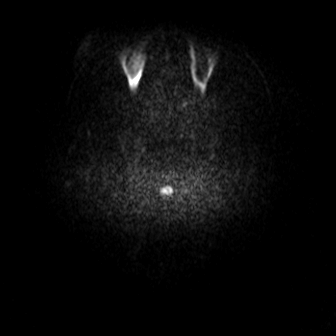
[im 13/52]
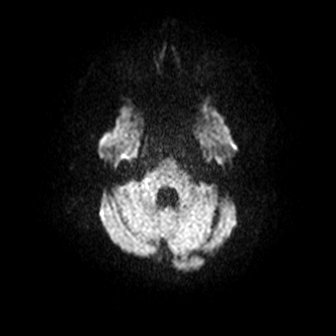
[im 26/52]
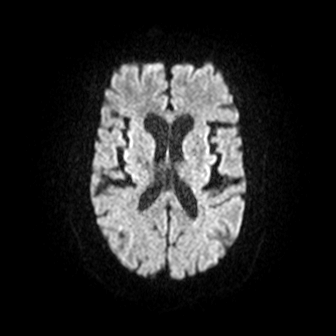
[im 39/52]
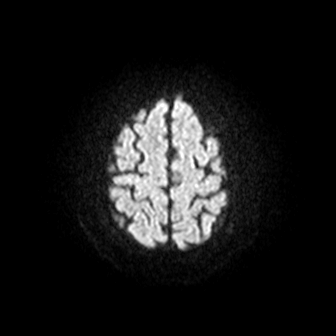
[im 52/52]
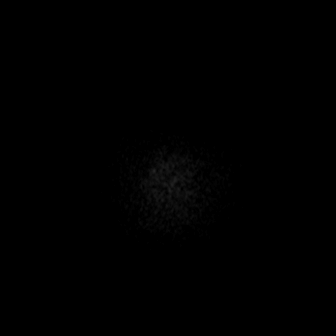

[Series 6: ax dwi_adc · axial · 3.0mm · 0.71mm/px · z∈[-76,+74]mm · 5 of 50 slices shown]
[im 1/50]
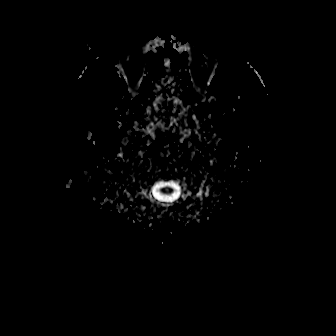
[im 13/50]
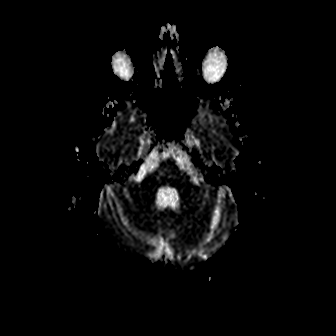
[im 25/50]
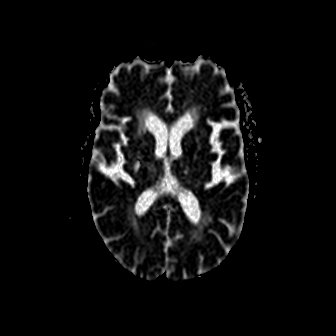
[im 37/50]
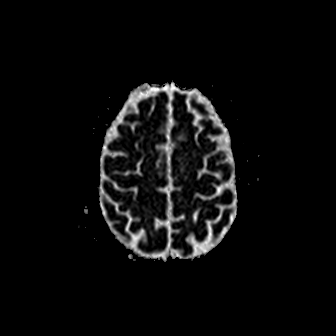
[im 50/50]
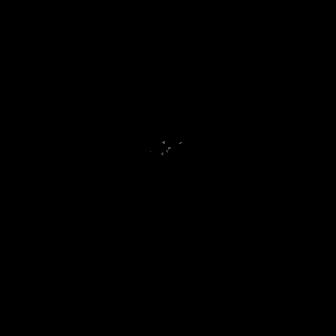

[Series 7: cor dwi_tracew · coronal · 5.0mm · 0.68mm/px · 3 of 38 slices shown]
[im 1/38]
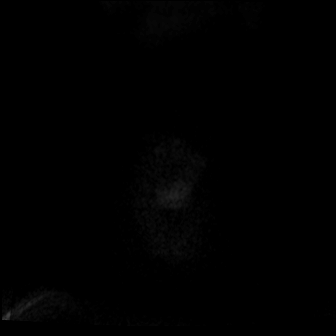
[im 19/38]
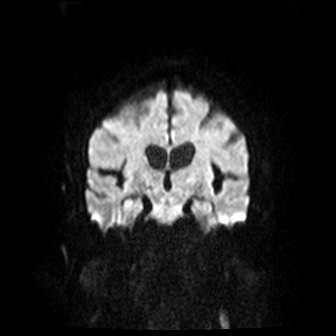
[im 38/38]
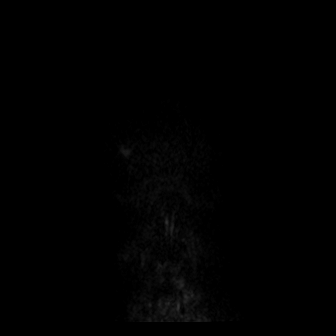

[Series 8: cor dwi_adc · coronal · 5.0mm · 0.68mm/px · 3 of 37 slices shown]
[im 1/37]
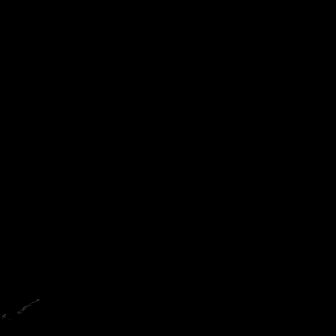
[im 19/37]
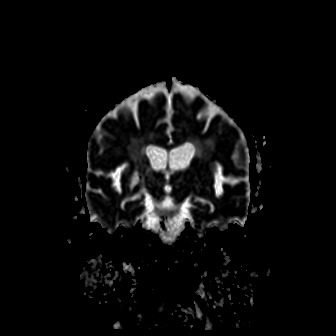
[im 37/37]
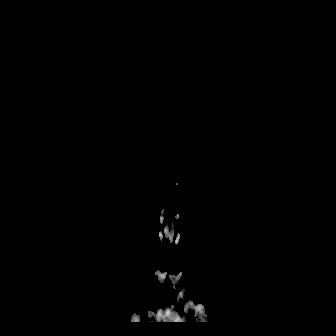

[Series 9: T1 · sagittal · 5.0mm · 0.47mm/px · 2 of 20 slices shown (1 of 2)]
[im 1/20]
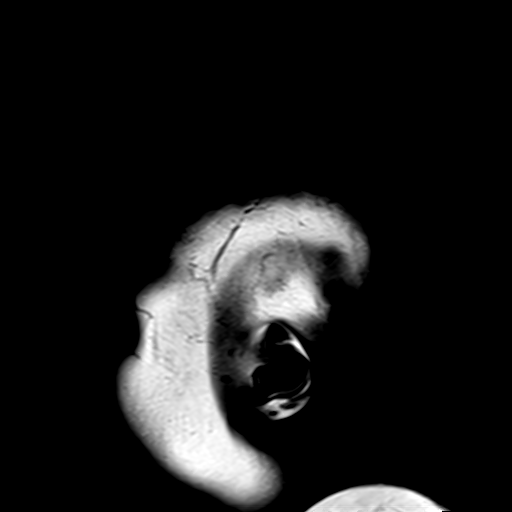
[im 20/20]
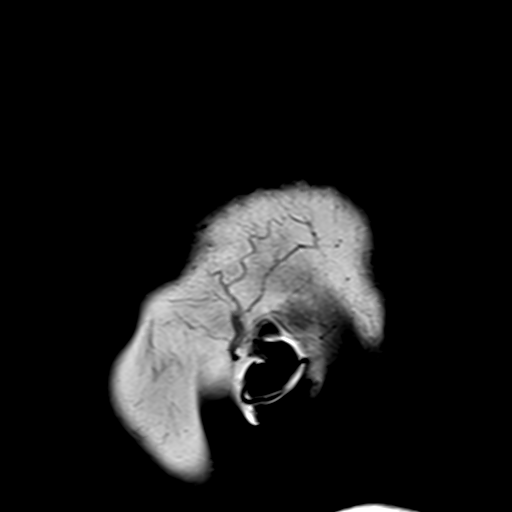

[Series 10: T2 · axial · 5.0mm · 0.86mm/px · z∈[-72,+72]mm · 2 of 25 slices shown (1 of 2)]
[im 1/25]
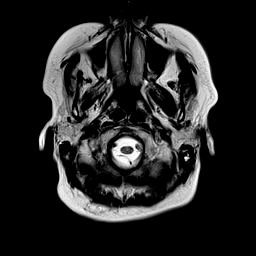
[im 25/25]
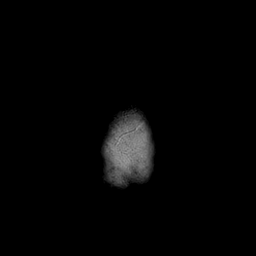

[Series 12: pha_images · axial · 3.0mm · 0.90mm/px · z∈[-76,+77]mm · 4 of 52 slices shown]
[im 1/52]
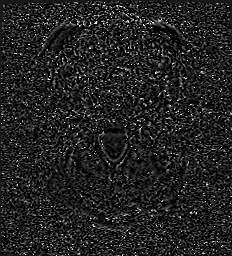
[im 18/52]
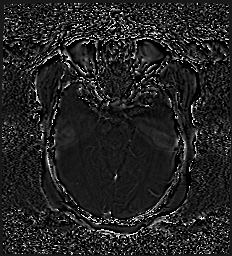
[im 35/52]
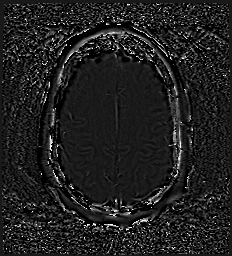
[im 52/52]
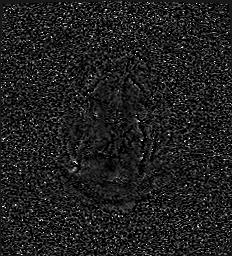

[Series 13: swi_images · axial · 3.0mm · 0.90mm/px · z∈[-76,+77]mm · 4 of 52 slices shown]
[im 1/52]
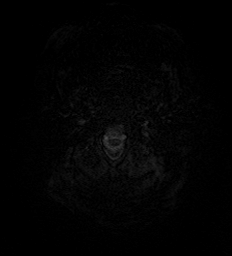
[im 18/52]
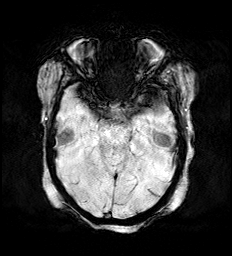
[im 35/52]
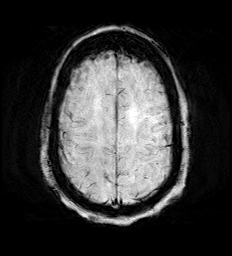
[im 52/52]
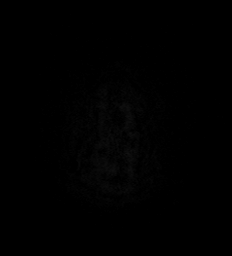

[Series 15: FLAIR · axial · 3.0mm · 0.69mm/px · z∈[-73,+74]mm · 4 of 50 slices shown]
[im 1/50]
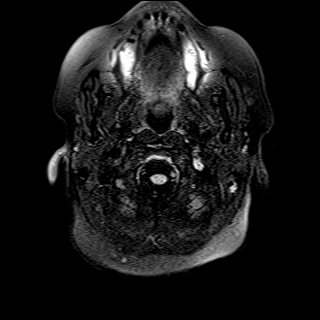
[im 17/50]
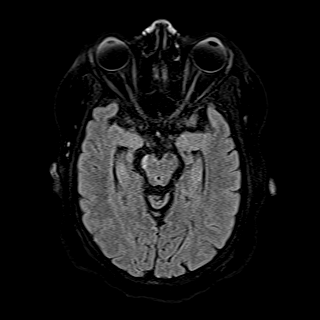
[im 33/50]
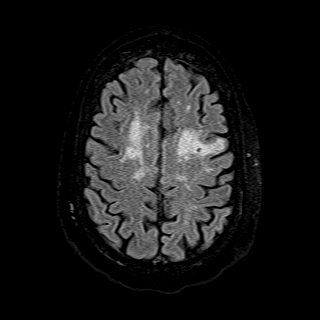
[im 50/50]
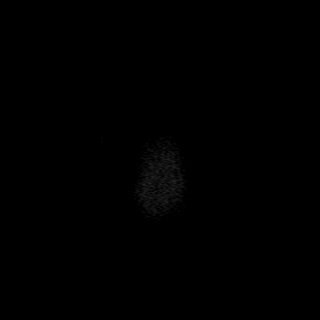

[Series 16: T1 · axial · 1.0mm · 0.98mm/px · z∈[-79,+80]mm · 14 of 160 slices shown (2 of 2)]
[im 1/160]
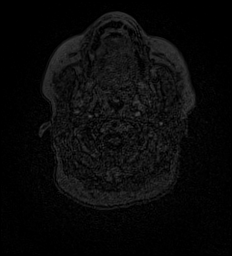
[im 13/160]
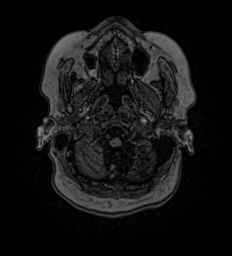
[im 25/160]
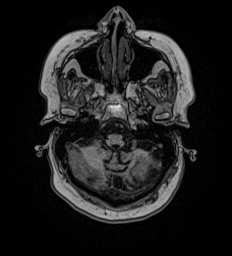
[im 37/160]
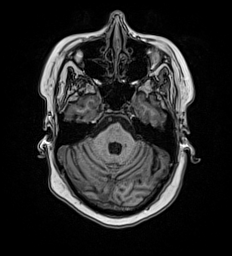
[im 49/160]
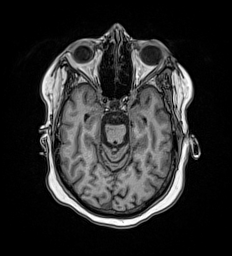
[im 62/160]
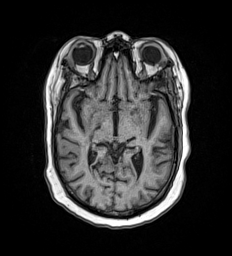
[im 74/160]
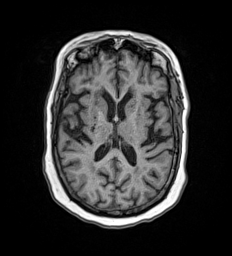
[im 86/160]
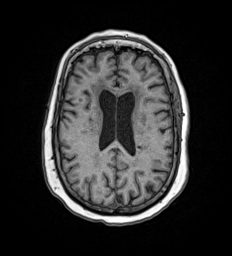
[im 98/160]
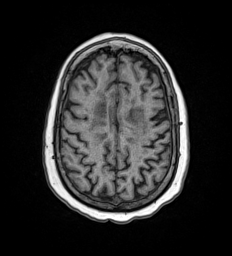
[im 111/160]
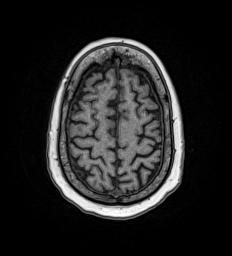
[im 123/160]
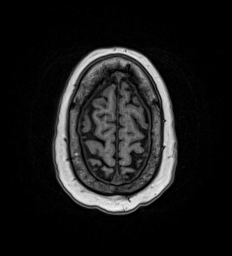
[im 135/160]
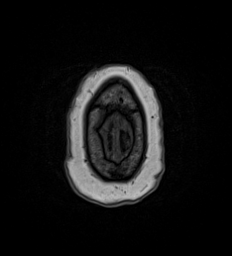
[im 147/160]
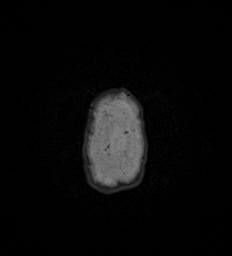
[im 160/160]
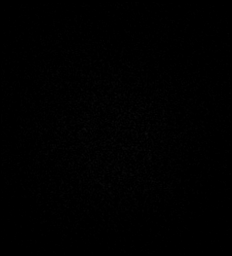

[Series 17: T2 · coronal · 5.0mm · 0.86mm/px · 2 of 29 slices shown (2 of 2)]
[im 1/29]
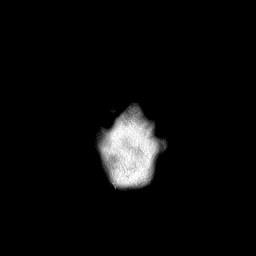
[im 29/29]
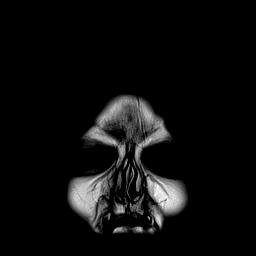

[48 of 48 positions shown; findings below may reference images not displayed]

FINDINGS: Brain: There is no evidence of acute intracranial hemorrhage,
extra-axial fluid collection, or acute infarct.

There is a background of moderate parenchymal volume loss, advanced
for age. There are remote infarcts in the bilateral basal ganglia,
thalami, right cerebral peduncle, and periventricular white matter.
There is a remote infarct in the left cerebellar hemisphere.
Additional confluent FLAIR signal abnormality in the subcortical and
periventricular white matter likely reflects sequela of chronic
white matter microangiopathy, also advanced for age.

There are multiple small chronic microhemorrhages in a central
distribution, likely hypertensive in etiology.

There is no solid mass lesion.  There is no midline shift.

Vascular: Normal flow voids.

Skull and upper cervical spine: Normal marrow signal.

Sinuses/Orbits: The paranasal sinuses are clear. The globes and
orbits are unremarkable.

Other: None.
IMPRESSION: 1. No acute intracranial pathology.
2. Moderate parenchymal volume loss and chronic white matter
microangiopathy, advanced for age.
3. Multiple remote infarcts in the bilateral cerebral hemispheres,
basal ganglia, thalami, and left cerebellar hemisphere.
4. Multiple small chronic microhemorrhages in a central
distribution, likely hypertensive in etiology.

## 2021-01-20 MED ORDER — DIPHENHYDRAMINE HCL 50 MG/ML IJ SOLN
50.0000 mg | Freq: Four times a day (QID) | INTRAMUSCULAR | Status: DC
  Administered 2021-01-20: 50 mg via INTRAVENOUS
  Filled 2021-01-20: qty 1

## 2021-01-20 MED ORDER — LORAZEPAM 2 MG/ML IJ SOLN: 1.0000 mg | Freq: Once | INTRAMUSCULAR | Status: DC

## 2021-01-20 MED ORDER — AMLODIPINE BESYLATE 10 MG PO TABS
10.0000 mg | ORAL_TABLET | Freq: Every day | ORAL | Status: DC
  Administered 2021-01-20 – 2021-01-30 (×10): 10 mg via ORAL
  Filled 2021-01-20 (×11): qty 1

## 2021-01-20 MED ORDER — METHYLPREDNISOLONE SODIUM SUCC 125 MG IJ SOLR
60.0000 mg | Freq: Two times a day (BID) | INTRAMUSCULAR | Status: DC
  Administered 2021-01-20 – 2021-01-21 (×2): 60 mg via INTRAVENOUS
  Filled 2021-01-20 (×2): qty 2

## 2021-01-20 MED ORDER — INSULIN GLARGINE-YFGN 100 UNIT/ML ~~LOC~~ SOLN
14.0000 [IU] | Freq: Two times a day (BID) | SUBCUTANEOUS | Status: DC
  Administered 2021-01-20 – 2021-01-21 (×2): 14 [IU] via SUBCUTANEOUS
  Filled 2021-01-20 (×4): qty 0.14

## 2021-01-20 MED ORDER — METOPROLOL TARTRATE 5 MG/5ML IV SOLN
5.0000 mg | INTRAVENOUS | Status: DC | PRN
  Administered 2021-01-21 – 2021-01-24 (×3): 5 mg via INTRAVENOUS
  Filled 2021-01-20 (×3): qty 5

## 2021-01-20 MED ORDER — DIPHENHYDRAMINE HCL 50 MG/ML IJ SOLN
25.0000 mg | Freq: Three times a day (TID) | INTRAMUSCULAR | Status: DC
Start: 2021-01-20 — End: 2021-01-28
  Administered 2021-01-20 – 2021-01-27 (×22): 25 mg via INTRAVENOUS
  Filled 2021-01-20 (×22): qty 1

## 2021-01-20 MED ORDER — ONDANSETRON HCL 4 MG/2ML IJ SOLN
4.0000 mg | Freq: Four times a day (QID) | INTRAMUSCULAR | Status: DC | PRN
  Administered 2021-01-20 – 2021-01-24 (×3): 4 mg via INTRAVENOUS
  Filled 2021-01-20 (×4): qty 2

## 2021-01-20 MED ORDER — CHLORHEXIDINE GLUCONATE 0.12 % MT SOLN
15.0000 mL | Freq: Two times a day (BID) | OROMUCOSAL | Status: DC
  Administered 2021-01-20 – 2021-02-02 (×22): 15 mL via OROMUCOSAL
  Filled 2021-01-20 (×23): qty 15

## 2021-01-20 MED ORDER — ORAL CARE MOUTH RINSE
15.0000 mL | Freq: Two times a day (BID) | OROMUCOSAL | Status: DC
  Administered 2021-01-20 – 2021-02-01 (×22): 15 mL via OROMUCOSAL

## 2021-01-20 MED ORDER — METHYLPREDNISOLONE SODIUM SUCC 125 MG IJ SOLR
125.0000 mg | Freq: Once | INTRAMUSCULAR | Status: AC
  Administered 2021-01-20: 125 mg via INTRAVENOUS
  Filled 2021-01-20: qty 2

## 2021-01-20 MED ORDER — FAMOTIDINE IN NACL 20-0.9 MG/50ML-% IV SOLN
20.0000 mg | Freq: Two times a day (BID) | INTRAVENOUS | Status: DC
  Administered 2021-01-20 – 2021-01-23 (×7): 20 mg via INTRAVENOUS
  Filled 2021-01-20 (×8): qty 50

## 2021-01-20 MED ORDER — WHITE PETROLATUM EX OINT
TOPICAL_OINTMENT | CUTANEOUS | Status: DC | PRN
  Filled 2021-01-20: qty 5

## 2021-01-20 MED ORDER — PANTOPRAZOLE SODIUM 40 MG IV SOLR
40.0000 mg | Freq: Two times a day (BID) | INTRAVENOUS | Status: DC
  Administered 2021-01-20 – 2021-01-27 (×15): 40 mg via INTRAVENOUS
  Filled 2021-01-20 (×15): qty 40

## 2021-01-20 MED ORDER — METHYLPREDNISOLONE SODIUM SUCC 125 MG IJ SOLR
60.0000 mg | Freq: Four times a day (QID) | INTRAMUSCULAR | Status: DC
  Administered 2021-01-20: 60 mg via INTRAVENOUS
  Filled 2021-01-20: qty 2

## 2021-01-20 MED ORDER — CHLORHEXIDINE GLUCONATE CLOTH 2 % EX PADS
6.0000 | MEDICATED_PAD | Freq: Every day | CUTANEOUS | Status: DC
  Administered 2021-01-20 – 2021-02-02 (×14): 6 via TOPICAL

## 2021-01-20 MED ORDER — DIPHENHYDRAMINE HCL 50 MG/ML IJ SOLN
25.0000 mg | Freq: Four times a day (QID) | INTRAMUSCULAR | Status: DC | PRN
  Administered 2021-01-20: 25 mg via INTRAVENOUS
  Filled 2021-01-20: qty 1

## 2021-01-20 MED ORDER — LISINOPRIL 20 MG PO TABS
40.0000 mg | ORAL_TABLET | Freq: Every day | ORAL | Status: DC
  Administered 2021-01-20 – 2021-01-30 (×9): 40 mg via ORAL
  Filled 2021-01-20 (×12): qty 2

## 2021-01-20 NOTE — Progress Notes (Signed)
Chart reviewed. Discussion with Nsg. Pt is finally sleeping, slightly less nauseated. Per Nsg Pt communicates verbally but with delayed speech, sometimes using gestures to assist. Will f/u tomorrow and determine baseline speech deficits to determine if any deficits are new warranting speech and language eval. Currently on a clear liquid diet secondary to nausea. Nsg to notify ST of any dysphagia with current diet.

## 2021-01-20 NOTE — Progress Notes (Signed)
NIF and VC attempted. Pt stated that she unable to close her mouth around mouthpiece enable to perform properly

## 2021-01-20 NOTE — Progress Notes (Signed)
CSW spoke with patients daughter who stated she lives on the third floor of an apartment building and her mother lives with her. Patient stated that she just got her apartment about a month ago. CSW stated that she received a message from PT/OT about moving to the first floor due to her mothers mobility. Patients daughter stated the first floor is too much money and they would not be able to break their lease. Patients daughter stated her mother needs a nursing home where they can provide more care. Patient daughter stated she and her sister cannot really care for her mother long term.

## 2021-01-20 NOTE — Progress Notes (Signed)
NIF/FVC Attempted, pt not able to perfom

## 2021-01-20 NOTE — Progress Notes (Addendum)
Subjective: Now able to speak with dysarthric short phrases and one-word replies. Frequent gagging this AM. New lip swelling. Patient states her throat feels swollen as well.   Objective: Current vital signs: BP (!) 154/116    Pulse (!) 116    Temp 99.4 F (37.4 C) (Oral)    Resp (!) 26    Ht 5\' 2"  (1.575 m)    Wt 97.7 kg    SpO2 94%    BMI 39.40 kg/m  Vital signs in last 24 hours: Temp:  [98.5 F (36.9 C)-99.4 F (37.4 C)] 99.4 F (37.4 C) (12/17 0100) Pulse Rate:  [107-150] 116 (12/17 0800) Resp:  [10-27] 26 (12/17 0800) BP: (108-179)/(68-158) 154/116 (12/17 0800) SpO2:  [93 %-100 %] 94 % (12/17 0800) Weight:  [97.7 kg] 97.7 kg (12/17 0049)  Intake/Output from previous day: 12/16 0701 - 12/17 0700 In: 1502.2 [I.V.:2.2; IV Piggyback:1500] Out: 1700 [Urine:1700] Intake/Output this shift: Total I/O In: 48.9 [I.V.:4.4; IV Piggyback:44.5] Out: -  Nutritional status:  Diet Order             Diet clear liquid Room service appropriate? Yes; Fluid consistency: Thin  Diet effective now                   HEENT-  [place]/AT. New lip swelling is noted.  Lungs- Respirations unlabored Extremities- Warm and well perfused   Neurological Examination Mental Status: Awake and alert. Able to speak with dysarthric, short phrases and one-word replies (improved since yesterday). Able to follow all commands.   Cranial Nerves: II: Temporal visual fields intact bilaterally when tested individually. However, there is extinction on the left to DSS. Fixates normally.   III,IV, VI: No ptosis. Eyes are conjugate. No nystagmus. Can track a moving object to the right and left, but has some difficulty with leftward gaze. Saccadic quality of pursuits is noted.  VII: RIGHT facial droop (lower quadrant).  VIII: Hearing intact to questions and commands IX,X: Deferred due to recent vomiting XI: Head is midlline XII: Midline tongue Motor: LUE: Flexion contractures at elbow, wrist and digits. Arm adducted  at shoulder. Increased tone. Can elevate forearm and elbow a few cm, but with difficulty. LLE: Increased tone. Falls to bed after passive elevation and release, with minimal effort against gravity. Spontaneous clonus to left foot intermittently. Sustained clonus with passive dorsiflexion by examiner.  RUE 4+/5  RLE 4+/5. Spontaneous clonus to right foot as well as with dorsiflexion-induced clonus.  Cerebellar: No ataxia disproportionate to weakness   Gait: Unable to assess.  Lab Results: Results for orders placed or performed during the hospital encounter of 01/19/21 (from the past 48 hour(s))  Protime-INR     Status: None   Collection Time: 01/19/21  7:43 AM  Result Value Ref Range   Prothrombin Time 13.5 11.4 - 15.2 seconds   INR 1.0 0.8 - 1.2    Comment: (NOTE) INR goal varies based on device and disease states. Performed at [facility], [address] [place], [place] [address]   APTT     Status: None   Collection Time: 01/19/21  7:43 AM  Result Value Ref Range   aPTT 29 24 - 36 seconds    Comment: Performed at [facility], [address] [place], [place] [address]  CBC     Status: Abnormal   Collection Time: 01/19/21  7:43 AM  Result Value Ref Range   WBC 8.0 4.0 - 10.5 K/uL   RBC 4.91 3.87 - 5.11  MIL/uL   Hemoglobin 13.0 12.0 - 15.0 g/dL   HCT 39.2 36.0 - 46.0 %   MCV 79.8 (L) 80.0 - 100.0 fL   MCH 26.5 26.0 - 34.0 pg   MCHC 33.2 30.0 - 36.0 g/dL   RDW 14.3 11.5 - 15.5 %   Platelets 366 150 - 400 K/uL   nRBC 0.0 0.0 - 0.2 %    Comment: Performed at [facility], [address] [place], [place] [address]  Differential     Status: None   Collection Time: 01/19/21  7:43 AM  Result Value Ref Range   Neutrophils Relative % 74 %   Neutro Abs 5.9 1.7 - 7.7 K/uL   Lymphocytes Relative 19 %   Lymphs Abs 1.5 0.7 - 4.0 K/uL   Monocytes Relative 5 %   Monocytes Absolute 0.4 0.1 - 1.0 K/uL   Eosinophils Relative 1 %   Eosinophils Absolute  0.0 0.0 - 0.5 K/uL   Basophils Relative 0 %   Basophils Absolute 0.0 0.0 - 0.1 K/uL   Immature Granulocytes 1 %   Abs Immature Granulocytes 0.04 0.00 - 0.07 K/uL    Comment: Performed at [facility], [address] [place], [place] [address]  Comprehensive metabolic panel     Status: Abnormal   Collection Time: 01/19/21  7:43 AM  Result Value Ref Range   Sodium 132 (L) 135 - 145 mmol/L   Potassium 3.9 3.5 - 5.1 mmol/L   Chloride 96 (L) 98 - 111 mmol/L   CO2 24 22 - 32 mmol/L   Glucose, Bld 336 (H) 70 - 99 mg/dL    Comment: Glucose reference range applies only to samples taken after fasting for at least 8 hours.   BUN 12 6 - 20 mg/dL   Creatinine, Ser 0.70 0.44 - 1.00 mg/dL   Calcium 9.1 8.9 - 10.3 mg/dL   Total Protein 8.0 6.5 - 8.1 g/dL   Albumin 3.7 3.5 - 5.0 g/dL   AST 56 (H) 15 - 41 U/L   ALT 61 (H) 0 - 44 U/L   Alkaline Phosphatase 90 38 - 126 U/L   Total Bilirubin 0.9 0.3 - 1.2 mg/dL   GFR, Estimated >60 >60 mL/min    Comment: (NOTE) Calculated using the CKD-EPI Creatinine Equation (2021)    Anion gap 12 5 - 15    Comment: Performed at [facility], [place], [place] [address]  Troponin I (High Sensitivity)     Status: None   Collection Time: 01/19/21  7:43 AM  Result Value Ref Range   Troponin I (High Sensitivity) 5 <18 ng/L    Comment: (NOTE) Elevated high sensitivity troponin I (hsTnI) values and significant  changes across serial measurements may suggest ACS but many other  chronic and acute conditions are known to elevate hsTnI results.  Refer to the "Links" section for chest pain algorithms and additional  guidance. Performed at [facility], [address] [place], [place] [address]   TSH     Status: Abnormal   Collection Time: 01/19/21  7:43 AM  Result Value Ref Range   TSH 0.257 (L) 0.350 - 4.500 uIU/mL    Comment: Performed by a 3rd Generation assay with a functional sensitivity of <=0.01 uIU/mL. Performed  at [facility], [address] [place], [place] [address]   CBG monitoring, ED     Status: Abnormal   Collection Time: 01/19/21  7:45 AM  Result Value Ref Range  Glucose-Capillary 312 (H) 70 - 99 mg/dL    Comment: Glucose reference range applies only to samples taken after fasting for at least 8 hours.  I-stat Creatinine, ED     Status: None   Collection Time: 01/19/21  8:07 AM  Result Value Ref Range   Creatinine, Ser 0.60 0.44 - 1.00 mg/dL  Lactic acid, plasma     Status: Abnormal   Collection Time: 01/19/21  8:50 AM  Result Value Ref Range   Lactic Acid, Venous 2.0 (HH) 0.5 - 1.9 mmol/L    Comment: CRITICAL RESULT CALLED TO, READ BACK BY AND VERIFIED WITH KATIE FERGUSSON AT 0920 01/19/21.PMF Performed at [facility], [address] [place], [place] [address]   Blood Culture (routine x 2)     Status: None (Preliminary result)   Collection Time: 01/19/21  8:50 AM   Specimen: BLOOD  Result Value Ref Range   Specimen Description BLOOD BLOOD LEFT FOREARM    Special Requests      BOTTLES DRAWN AEROBIC AND ANAEROBIC Blood Culture results may not be optimal due to an inadequate volume of blood received in culture bottles   Culture      NO GROWTH < 24 HOURS Performed at [facility], [address], [place], [place] [address]    Report Status PENDING   Resp Panel by RT-PCR (Flu A&B, Covid) Nasopharyngeal Swab     Status: None   Collection Time: 01/19/21  9:44 AM   Specimen: Nasopharyngeal Swab; Nasopharyngeal(NP) swabs in vial transport medium  Result Value Ref Range   SARS Coronavirus 2 by RT PCR NEGATIVE NEGATIVE    Comment: (NOTE) SARS-CoV-2 target nucleic acids are NOT DETECTED.  The SARS-CoV-2 RNA is generally detectable in upper respiratory specimens during the acute phase of infection. The lowest concentration of SARS-CoV-2 viral copies this assay can detect is 138 copies/mL. A negative result does not preclude SARS-Cov-2 infection and  should not be used as the sole basis for treatment or other patient management decisions. A negative result may occur with  improper specimen collection/handling, submission of specimen other than nasopharyngeal swab, presence of viral mutation(s) within the areas targeted by this assay, and inadequate number of viral copies(<138 copies/mL). A negative result must be combined with clinical observations, patient history, and epidemiological information. The expected result is Negative.  Fact Sheet for Patients:  EntrepreneurPulse.com.au  Fact Sheet for Healthcare Providers:  IncredibleEmployment.be  This test is no t yet approved or cleared by the [place] FDA and  has been authorized for detection and/or diagnosis of SARS-CoV-2 by FDA under an Emergency Use Authorization (EUA). This EUA will remain  in effect (meaning this test can be used) for the duration of the COVID-19 declaration under Section 564(b)(1) of the Act, 21 U.S.C.section 360bbb-3(b)(1), unless the authorization is terminated  or revoked sooner.       Influenza A by PCR NEGATIVE NEGATIVE   Influenza B by PCR NEGATIVE NEGATIVE    Comment: (NOTE) The Xpert Xpress SARS-CoV-2/FLU/RSV plus assay is intended as an aid in the diagnosis of influenza from Nasopharyngeal swab specimens and should not be used as a sole basis for treatment. Nasal washings and aspirates are unacceptable for Xpert Xpress SARS-CoV-2/FLU/RSV testing.  Fact Sheet for Patients: EntrepreneurPulse.com.au  Fact Sheet for Healthcare Providers: IncredibleEmployment.be  This test is not yet approved or cleared by the [place] FDA and has been authorized for detection and/or diagnosis of SARS-CoV-2 by FDA under an Emergency Use Authorization (EUA). This  EUA will remain in effect (meaning this test can be used) for the duration of the COVID-19 declaration under Section  564(b)(1) of the Act, 21 U.S.C. section 360bbb-3(b)(1), unless the authorization is terminated or revoked.  Performed at [facility], [address] [place], [place] [address]   Lactic acid, plasma     Status: None   Collection Time: 01/19/21 10:02 AM  Result Value Ref Range   Lactic Acid, Venous 1.4 0.5 - 1.9 mmol/L    Comment: Performed at [facility], [address] [place], [place] [address]  Culture, blood (Routine X 2) w Reflex to ID Panel     Status: None (Preliminary result)   Collection Time: 01/19/21 10:02 AM   Specimen: BLOOD  Result Value Ref Range   Specimen Description BLOOD RIGHT FOA    Special Requests      BOTTLES DRAWN AEROBIC AND ANAEROBIC Blood Culture adequate volume   Culture      NO GROWTH < 24 HOURS Performed at [facility], [address] [place], [place] [address]    Report Status PENDING   Urinalysis, Complete w Microscopic     Status: Abnormal   Collection Time: 01/19/21 11:30 AM  Result Value Ref Range   Color, Urine YELLOW YELLOW   APPearance CLEAR CLEAR   Specific Gravity, Urine <1.005 (L) 1.005 - 1.030   pH 5.0 5.0 - 8.0   Glucose, UA >1,000 (A) NEGATIVE mg/dL   Hgb urine dipstick NEGATIVE NEGATIVE   Bilirubin Urine NEGATIVE NEGATIVE   Ketones, ur 80 (A) NEGATIVE mg/dL   Protein, ur NEGATIVE NEGATIVE mg/dL   Nitrite NEGATIVE NEGATIVE   Leukocytes,Ua TRACE (A) NEGATIVE   Squamous Epithelial / LPF 0-5 0 - 5   WBC, UA 6-10 0 - 5 WBC/hpf   RBC / HPF 0-5 0 - 5 RBC/hpf   Bacteria, UA RARE (A) NONE SEEN   Mucus PRESENT     Comment: Performed at [facility], [place], [place] [address]  Troponin I (High Sensitivity)     Status: None   Collection Time: 01/19/21 11:30 AM  Result Value Ref Range   Troponin I (High Sensitivity) 5 <18 ng/L    Comment: (NOTE) Elevated high sensitivity troponin I (hsTnI) values and significant  changes across serial measurements may suggest ACS but many  other  chronic and acute conditions are known to elevate hsTnI results.  Refer to the "Links" section for chest pain algorithms and additional  guidance. Performed at [facility], [address] [place], [place] [address]   Urine Drug Screen, Qualitative [facility] only)     Status: Abnormal   Collection Time: 01/19/21 11:30 AM  Result Value Ref Range   Tricyclic, Ur Screen POSITIVE (A) NONE DETECTED   Amphetamines, Ur Screen NONE DETECTED NONE DETECTED   MDMA (Ecstasy)Ur Screen NONE DETECTED NONE DETECTED   Cocaine Metabolite,Ur [place] NONE DETECTED NONE DETECTED   Opiate, Ur Screen NONE DETECTED NONE DETECTED   Phencyclidine (PCP) Ur S NONE DETECTED NONE DETECTED   Cannabinoid 50 Ng, Ur [place] NONE DETECTED NONE DETECTED   Barbiturates, Ur Screen NONE DETECTED NONE DETECTED   Benzodiazepine, Ur Scrn NONE DETECTED NONE DETECTED   Methadone Scn, Ur NONE DETECTED NONE DETECTED    Comment: (NOTE) Tricyclics + metabolites, urine    Cutoff 1000 ng/mL Amphetamines + metabolites, urine  Cutoff 1000 ng/mL MDMA (Ecstasy), urine  Cutoff 500 ng/mL Cocaine Metabolite, urine          Cutoff 300 ng/mL Opiate + metabolites, urine        Cutoff 300 ng/mL Phencyclidine (PCP), urine         Cutoff 25 ng/mL Cannabinoid, urine                 Cutoff 50 ng/mL Barbiturates + metabolites, urine  Cutoff 200 ng/mL Benzodiazepine, urine              Cutoff 200 ng/mL Methadone, urine                   Cutoff 300 ng/mL  The urine drug screen provides only a preliminary, unconfirmed analytical test result and should not be used for non-medical purposes. Clinical consideration and professional judgment should be applied to any positive drug screen result due to possible interfering substances. A more specific alternate chemical method must be used in order to obtain a confirmed analytical result. Gas chromatography / mass spectrometry (GC/MS) is the preferred confirm atory method. Performed at  [facility], [address] [place], [place] [address]   Pregnancy, urine     Status: None   Collection Time: 01/19/21 11:30 AM  Result Value Ref Range   Preg Test, Ur NEGATIVE NEGATIVE    Comment: Performed at [facility], [address] [place], [place] [address]  CBG monitoring, ED     Status: Abnormal   Collection Time: 01/19/21  1:46 PM  Result Value Ref Range   Glucose-Capillary 285 (H) 70 - 99 mg/dL    Comment: Glucose reference range applies only to samples taken after fasting for at least 8 hours.  CBG monitoring, ED     Status: Abnormal   Collection Time: 01/19/21  5:39 PM  Result Value Ref Range   Glucose-Capillary 322 (H) 70 - 99 mg/dL    Comment: Glucose reference range applies only to samples taken after fasting for at least 8 hours.  CBG monitoring, ED     Status: Abnormal   Collection Time: 01/19/21  9:52 PM  Result Value Ref Range   Glucose-Capillary 314 (H) 70 - 99 mg/dL    Comment: Glucose reference range applies only to samples taken after fasting for at least 8 hours.  Lipid panel     Status: None   Collection Time: 01/20/21  7:09 AM  Result Value Ref Range   Cholesterol 165 0 - 200 mg/dL   Triglycerides 81 <150 mg/dL   HDL 54 >40 mg/dL   Total CHOL/HDL Ratio 3.1 RATIO   VLDL 16 0 - 40 mg/dL   LDL Cholesterol 95 0 - 99 mg/dL    Comment:        Total Cholesterol/HDL:CHD Risk Coronary Heart Disease Risk Table                     Men   Women  1/2 Average Risk   3.4   3.3  Average Risk       5.0   4.4  2 X Average Risk   9.6   7.1  3 X Average Risk  23.4   11.0        Use the calculated Patient Ratio above and the CHD Risk Table to determine the patient's CHD Risk.        ATP III CLASSIFICATION (LDL):  <100     mg/dL   Optimal  100-129  mg/dL  Near or Above                    Optimal  130-159  mg/dL   Borderline  160-189  mg/dL   High  >190     mg/dL   Very High Performed at [facility], [address]  [place], [place] [address]   T4, free     Status: Abnormal   Collection Time: 01/20/21  7:09 AM  Result Value Ref Range   Free T4 1.16 (H) 0.61 - 1.12 ng/dL    Comment: (NOTE) Biotin ingestion may interfere with free T4 tests. If the results are inconsistent with the TSH level, previous test results, or the clinical presentation, then consider biotin interference. If needed, order repeat testing after stopping biotin. Performed at [facility], [address] [place], [place] [address]   Lipase, blood     Status: Abnormal   Collection Time: 01/20/21  7:09 AM  Result Value Ref Range   Lipase 110 (H) 11 - 51 U/L    Comment: Performed at [facility], [address] [place], [place] [address]  Glucose, capillary     Status: Abnormal   Collection Time: 01/20/21  7:51 AM  Result Value Ref Range   Glucose-Capillary 328 (H) 70 - 99 mg/dL    Comment: Glucose reference range applies only to samples taken after fasting for at least 8 hours.    Recent Results (from the past 240 hour(s))  Blood Culture (routine x 2)     Status: None (Preliminary result)   Collection Time: 01/19/21  8:50 AM   Specimen: BLOOD  Result Value Ref Range Status   Specimen Description BLOOD BLOOD LEFT FOREARM  Final   Special Requests   Final    BOTTLES DRAWN AEROBIC AND ANAEROBIC Blood Culture results may not be optimal due to an inadequate volume of blood received in culture bottles   Culture   Final    NO GROWTH < 24 HOURS Performed at [facility], [address] [place], [place] [address]    Report Status PENDING  Incomplete  Resp Panel by RT-PCR (Flu A&B, Covid) Nasopharyngeal Swab     Status: None   Collection Time: 01/19/21  9:44 AM   Specimen: Nasopharyngeal Swab; Nasopharyngeal(NP) swabs in vial transport medium  Result Value Ref Range Status   SARS Coronavirus 2 by RT PCR NEGATIVE NEGATIVE Final    Comment: (NOTE) SARS-CoV-2 target nucleic acids are NOT DETECTED.  The  SARS-CoV-2 RNA is generally detectable in upper respiratory specimens during the acute phase of infection. The lowest concentration of SARS-CoV-2 viral copies this assay can detect is 138 copies/mL. A negative result does not preclude SARS-Cov-2 infection and should not be used as the sole basis for treatment or other patient management decisions. A negative result may occur with  improper specimen collection/handling, submission of specimen other than nasopharyngeal swab, presence of viral mutation(s) within the areas targeted by this assay, and inadequate number of viral copies(<138 copies/mL). A negative result must be combined with clinical observations, patient history, and epidemiological information. The expected result is Negative.  Fact Sheet for Patients:  EntrepreneurPulse.com.au  Fact Sheet for Healthcare Providers:  IncredibleEmployment.be  This test is no t yet approved or cleared by the [place] FDA and  has been authorized for detection and/or diagnosis of SARS-CoV-2 by FDA under an Emergency Use Authorization (EUA). This EUA will remain  in effect (meaning this test can be used)  for the duration of the COVID-19 declaration under Section 564(b)(1) of the Act, 21 U.S.C.section 360bbb-3(b)(1), unless the authorization is terminated  or revoked sooner.       Influenza A by PCR NEGATIVE NEGATIVE Final   Influenza B by PCR NEGATIVE NEGATIVE Final    Comment: (NOTE) The Xpert Xpress SARS-CoV-2/FLU/RSV plus assay is intended as an aid in the diagnosis of influenza from Nasopharyngeal swab specimens and should not be used as a sole basis for treatment. Nasal washings and aspirates are unacceptable for Xpert Xpress SARS-CoV-2/FLU/RSV testing.  Fact Sheet for Patients: EntrepreneurPulse.com.au  Fact Sheet for Healthcare Providers: IncredibleEmployment.be  This test is not yet approved or  cleared by the [place] FDA and has been authorized for detection and/or diagnosis of SARS-CoV-2 by FDA under an Emergency Use Authorization (EUA). This EUA will remain in effect (meaning this test can be used) for the duration of the COVID-19 declaration under Section 564(b)(1) of the Act, 21 U.S.C. section 360bbb-3(b)(1), unless the authorization is terminated or revoked.  Performed at [facility], [address] [place], [place] [address]   Culture, blood (Routine X 2) w Reflex to ID Panel     Status: None (Preliminary result)   Collection Time: 01/19/21 10:02 AM   Specimen: BLOOD  Result Value Ref Range Status   Specimen Description BLOOD RIGHT FOA  Final   Special Requests   Final    BOTTLES DRAWN AEROBIC AND ANAEROBIC Blood Culture adequate volume   Culture   Final    NO GROWTH < 24 HOURS Performed at [facility], [address] [place], [place] [address]    Report Status PENDING  Incomplete    Lipid Panel Recent Labs    01/20/21 0709  CHOL 165  TRIG 81  HDL 54  CHOLHDL 3.1  VLDL 16  LDLCALC 95    Studies/Results: CT ABDOMEN PELVIS WO CONTRAST  Result Date: 01/19/2021 CLINICAL DATA:  Nausea, vomiting, abdominal pain. EXAM: CT ABDOMEN AND PELVIS WITHOUT CONTRAST TECHNIQUE: Multidetector CT imaging of the abdomen and pelvis was performed following the standard protocol without IV contrast. COMPARISON:  None. FINDINGS: Lower chest: Small sliding-type hiatal hernia. Visualized lung bases are unremarkable. Hepatobiliary: No focal liver abnormality is seen. No gallstones, gallbladder wall thickening, or biliary dilatation. Pancreas: Unremarkable. No pancreatic ductal dilatation or surrounding inflammatory changes. Spleen: Normal in size without focal abnormality. Adrenals/Urinary Tract: Adrenal glands are unremarkable. Kidneys are normal, without renal calculi, focal lesion, or hydronephrosis. Bladder is unremarkable. Stomach/Bowel: Stomach is  within normal limits. Appendix appears normal. No evidence of bowel wall thickening, distention, or inflammatory changes. Vascular/Lymphatic: Aortic atherosclerosis. No enlarged abdominal or pelvic lymph nodes. Reproductive: Patient appears to be status post hysterectomy. No definite adnexal abnormality is noted. 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 Other: No abdominal wall hernia or abnormality. No abdominopelvic ascites. Musculoskeletal: No acute or significant osseous findings. IMPRESSION: Patient appears to be status post hysterectomy. 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 Small sliding-type hiatal hernia. Aortic Atherosclerosis (ICD10-I70.0). Electronically Signed   By: Marijo Conception M.D.   On: 01/19/2021 13:21   DG Chest Port 1 View  Result Date: 01/19/2021 CLINICAL DATA:  Sepsis. EXAM: PORTABLE CHEST 1 VIEW COMPARISON:  None. FINDINGS: The heart size and mediastinal contours are within normal limits. Both lungs are clear. Sternotomy wires are noted. The visualized skeletal structures are unremarkable. IMPRESSION: No active disease. Electronically  Signed   By: Marijo Conception M.D.   On: 01/19/2021 09:18   CT HEAD CODE STROKE WO CONTRAST  Result Date: 01/19/2021 CLINICAL DATA:  Code stroke. Generalized weakness. Aphasia and left-sided deficits from prior stroke EXAM: CT HEAD WITHOUT CONTRAST TECHNIQUE: Contiguous axial images were obtained from the base of the skull through the vertex without intravenous contrast. COMPARISON:  None. FINDINGS: Brain: Confluent low-density in the cerebral white matter with superimposed chronic lacunar infarcts at the deep gray nuclei and deep white matter tracks. 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 Premature brain atrophy. No acute hemorrhage, hydrocephalus, collection, or masslike finding. Brain atrophy especially  affecting the cerebellum. Vascular: No hyperdense vessel. Premature atheromatous calcification. Skull: Normal. Negative for fracture or focal lesion. Sinuses/Orbits: No acute finding. Other: These results were called by telephone at the time of interpretation on 01/19/2021 at 7:59 am to provider Merlyn Lot , who verbally acknowledged these results. ASPECTS [facility] Stroke Program Early CT Score) No acute infarct. IMPRESSION: 1. No acute finding. 2. 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 Electronically Signed   By: Jorje Guild M.D.   On: 01/19/2021 08:01   CT ANGIO HEAD CODE STROKE  Result Date: 01/19/2021 CLINICAL DATA:  Generalized weakness with aphasia. Left-sided deficits. Vomiting. EXAM: CT ANGIOGRAPHY HEAD AND NECK TECHNIQUE: Multidetector CT imaging of the head and neck was performed using the standard protocol during bolus administration of intravenous contrast. Multiplanar CT image reconstructions and MIPs were obtained to evaluate the vascular anatomy. Carotid stenosis measurements (when applicable) are obtained utilizing NASCET criteria, using the distal internal carotid diameter as the denominator. CONTRAST:  75mL OMNIPAQUE IOHEXOL 350 MG/ML SOLN COMPARISON:  None. FINDINGS: CTA NECK FINDINGS Aortic arch: Atheromatous plaque. 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 No dissection or ulceration. Left carotid system: Age advanced atheromatous plaque along the common carotid and proximal ICA without flow limiting stenosis or ulceration. Vertebral arteries: Aberrant right subclavian artery. Significant for age atheromatous plaque at the bilateral proximal subclavian, but without flow limiting stenosis. Heavily diseased vertebral arteries with extensive calcific plaque and multifocal  narrowing. The origins are difficult to assess due to small vessel size, calcified plaque, and body habitus. Both vertebral arteries are patent at the dura although there is subsequent occlusion on the left. Skeleton: No acute finding Other neck: No acute finding Upper chest: No acute finding Review of the MIP images confirms the above findings CTA HEAD FINDINGS Anterior circulation: Heavily diseased carotid siphon with calcified plaque and multifocal luminal stenosis. The degree of calcified plaque limits lumen measurement, expect flow limiting stenosis on both sides. 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 Extensive atheromatous plaque affecting the M1 segments and MCA branches. Moderate narrowing at the bilateral M1 segment. Negative for aneurysm. Posterior circulation: Heavily diseased vertebral arteries with calcified plaque. Severe right vertebral stenosis beyond the PICA. Left proximal V4 segment occlusion with potentially retrograde flow seen at the proximal left PICA. Narrow basilar diffusely with 2 segments of non enhancement. Fetal type bilateral PCA flow. Venous sinuses: No emergent finding Anatomic variants: As above Review of the MIP images confirms the above findings Critical Value/emergent results were called by telephone at the time of interpretation on 01/19/2021 at 8:42 am to provider Trine Fread [facility] , who verbally acknowledged these results. IMPRESSION:  1. Generalized and severe atherosclerosis, especially for age. 2. Dominant findings in the posterior circulation where there are left vertebral and basilar occlusions which are age indeterminate. Severe right V4 segment stenosis. 3. No flow limiting stenosis in the cervical carotid circulation. 4. Probable flow limiting stenosis at the carotid siphons, especially on the right. Electronically Signed   By: Jorje Guild M.D.   On: 01/19/2021 08:50   CT ANGIO NECK CODE STROKE  Result Date: 01/19/2021 CLINICAL DATA:   Generalized weakness with aphasia. Left-sided deficits. Vomiting. EXAM: CT ANGIOGRAPHY HEAD AND NECK TECHNIQUE: Multidetector CT imaging of the head and neck was performed using the standard protocol during bolus administration of intravenous contrast. Multiplanar CT image reconstructions and MIPs were obtained to evaluate the vascular anatomy. Carotid stenosis measurements (when applicable) are obtained utilizing NASCET criteria, using the distal internal carotid diameter as the denominator. CONTRAST:  23mL OMNIPAQUE IOHEXOL 350 MG/ML SOLN COMPARISON:  None. FINDINGS: CTA NECK FINDINGS Aortic arch: Atheromatous plaque. 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 No dissection or ulceration. Left carotid system: Age advanced atheromatous plaque along the common carotid and proximal ICA without flow limiting stenosis or ulceration. Vertebral arteries: Aberrant right subclavian artery. Significant for age atheromatous plaque at the bilateral proximal subclavian, but without flow limiting stenosis. Heavily diseased vertebral arteries with extensive calcific plaque and multifocal narrowing. The origins are difficult to assess due to small vessel size, calcified plaque, and body habitus. Both vertebral arteries are patent at the dura although there is subsequent occlusion on the left. Skeleton: No acute finding Other neck: No acute finding Upper chest: No acute finding Review of the MIP images confirms the above findings CTA HEAD FINDINGS Anterior circulation: Heavily diseased carotid siphon with calcified plaque and multifocal luminal stenosis. The degree of calcified plaque limits lumen measurement, expect flow limiting stenosis on both sides. 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 Extensive atheromatous  plaque affecting the M1 segments and MCA branches. Moderate narrowing at the bilateral M1 segment. Negative for aneurysm. Posterior circulation: Heavily diseased vertebral arteries with calcified plaque. Severe right vertebral stenosis beyond the PICA. Left proximal V4 segment occlusion with potentially retrograde flow seen at the proximal left PICA. Narrow basilar diffusely with 2 segments of non enhancement. Fetal type bilateral PCA flow. Venous sinuses: No emergent finding Anatomic variants: As above Review of the MIP images confirms the above findings Critical Value/emergent results were called by telephone at the time of interpretation on 01/19/2021 at 8:42 am to provider [facility] , who verbally acknowledged these results. IMPRESSION: 1. Generalized and severe atherosclerosis, especially for age. 2. Dominant findings in the posterior circulation where there are left vertebral and basilar occlusions which are age indeterminate. Severe right V4 segment stenosis. 3. No flow limiting stenosis in the cervical carotid circulation. 4. Probable flow limiting stenosis at the carotid siphons, especially on the right. Electronically Signed   By: Jorje Guild M.D.   On: 01/19/2021 08:50    Medications: Scheduled:   stroke: mapping our early stages of recovery book   Does not apply Once   aspirin EC  81 mg Oral Daily   atorvastatin  80 mg Oral Daily   chlorhexidine  15 mL Mouth Rinse BID   Chlorhexidine Gluconate Cloth  6 each Topical Daily   cyclobenzaprine  5  mg Oral TID   docusate sodium  200 mg Oral QHS   escitalopram  20 mg Oral Daily   gabapentin  200 mg Oral TID   heparin  5,000 Units Subcutaneous Q8H   insulin aspart  0-5 Units Subcutaneous QHS   insulin aspart  0-9 Units Subcutaneous TID WC   insulin glargine-yfgn  10 Units Subcutaneous BID   loratadine  10 mg Oral Daily   LORazepam  1 mg Intravenous Once   LORazepam  0.25 mg Oral QHS   mouth rinse  15 mL Mouth Rinse q12n4p   melatonin   10 mg Oral QHS   methylPREDNISolone (SOLU-MEDROL) injection  60 mg Intravenous Q12H   metoprolol tartrate  75 mg Oral BID   pantoprazole (PROTONIX) IV  40 mg Intravenous Q12H   sodium chloride flush  3 mL Intravenous Once   ticagrelor  90 mg Oral BID   Continuous:  famotidine (PEPCID) IV 100 mL/hr at 01/20/21 0800   labetalol (NORMODYNE) infusion 5 mg/mL 0.5 mg/min (01/20/21 0800)    Assessment: 50 year old female presenting with acute onset of mutism and BLE weakness. Also with intermittent vomiting. Overall presentation most consistent with a small brainstem or thalamic stroke versus a left perisylvian stroke.  1. Exam today reveals findings referable to the old right basal ganglia lacunar infarct seen on CT head. Also noted is expressive aphasia which has improved from mutism noted yesterday, to one word answers and 1-3 word short phrases today. Speech is dysarthric.   2. CT head: No acute finding. 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 3. CTA of head and neck: Generalized and severe atherosclerosis, especially for age. Most severe abnormalities are in the posterior circulation where there are left vertebral and basilar occlusions which are age indeterminate, as well as severe right V4 segment stenosis. No flow limiting stenosis in the cervical carotid circulation. Probable flow limiting stenosis at the carotid siphons, especially on the right. 4. New lip swelling. Patient states her throat feels swollen as well. Felt most likely to be due to her known iodine contrast allergy. Another possibility would be an allergy to newly started ticlopidine, since it can cross react in patient's with an allergy to Plavix. Patient states that she has an allergy to ASA, but that it was mild; she cannot remember the specific manifestations of the allergy. She is currently on Solumedrol.     Recommendations: 1. BP management. Averaging about [id number] systolic. Goal given her severe stenoses on CTA should be a SBP of 130-150 for next 24 hours, which is about a 15% reduction.  2. MRI of the brain without contrast is pending 3. TTE is pending  4. Cardiac telemetry 5. Frequent neuro checks 6. HgbA1c, fasting lipid panel 7. PT consult, OT consult, Speech consult 8. 40 mg atorvastatin po qd provided that she has no allergy to statins.  9. ASA 81 mg po qd. Has an allergy to clopidogrel. 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    10. IVF 11. Monitor her lip swelling closely. Also monitor breathing, breath sounds especially in the neck region and continuous pulse ox. Obtaining respiratory therapy consult for testing of NIF and FVC q12h.    Addendum: - MRI brain performed AFTER the visual field cut was noted shows no acute intracranial pathology. There is moderate parenchymal volume loss and chronic white matter microangiopathy, advanced for age. Multiple remote infarcts  in the bilateral cerebral hemispheres, basal ganglia, thalami, and left cerebellar hemisphere. Multiple small chronic microhemorrhages in a central distribution, likely hypertensive in etiology. - Repeat visual fields testing at approximately 7 PM, after completion of MRI, now with visual field cut in the temporal quadrants OS and nasal quadrants OD. Testing had to be repeated several times due to patient with decreased attention due to drowsiness.    LOS: 1 day   @Electronically  signed: Dr. Kerney Elbe 01/20/2021  9:58 AM

## 2021-01-20 NOTE — Progress Notes (Signed)
PROGRESS NOTE    Lori [name]  ZOX:[id number] DOB: 1970/12/09 DOA: 01/19/2021 PCP: Pcp, No    Brief Narrative:  50 y.o. female with medical history significant of hypertension, hyperlipidemia, diabetes mellitus, stroke with left-sided weakness, thyroid disease (patient does not know which type of thyroid disease), depression with anxiety, CAD, s/p of CABG, pheochromocytoma (s/p of right adrenal gland removal per patient), who presents with aphasia, nausea and vomiting.   Patient has history of stroke with left-sided weakness.  At her normal baseline, patient is able to perform ADLs, typically independently with assistance of a walker.  Patient was last known normal at 10 PM last night 01/18/21. Today she started having difficult speaking at about 6 AM.  She also reports bilateral leg weakness, cannot walk due to weakness.  Patient denies chest pain, cough, shortness of breath.  No fever or chills.  No symptoms of UTI.  She states that she has nausea, and has vomited at least 8 times with bilious and greenish colored vomitus.  Denies diarrhea or abdominal pain.   Patient was found to have sinus tachycardia with heart rate up to 160s, patient was given IV metoprolol in the ED.  Heart rate improved to 120s.  Heart rate has improved however patient remains tachycardic and hypertensive.  She is mentating clearly but does have significant soft tissue swelling.  Allergic reaction but unknown trigger.  Patient attributes it to known iodine contrast allergy.  Maintaining airway with clear lungs.   Assessment & Plan:   Principal Problem:   Stroke [facility]) Active Problems:   Sinus tachycardia   Diabetes mellitus without complication (HCC)   Thyroid disease   Hypertension   CAD (coronary artery disease)   Pheochromocytoma   Depression   HLD (hyperlipidemia)   Nausea & vomiting   CVA (cerebral vascular accident) ([place])  Aphasia Bilateral lower extremity weakness Intermittent  vomiting Concern for brainstem versus thalamic CVA CT negative Neurology following Aphasia appears to be improving Speech remains dysarthric Plan: Blood pressure management MRI brain currently pending TTE, pending Telemetry Frequent neurochecks Check A1c, fasting lipids Therapy and speech evaluations Statin Aspirin 81 daily Ticagrelor Intravenous fluids   New lip swelling Presentation concerning for allergic reaction Possibly attributed to iodine allergy Also possible ticlopidine Plan: Schedule Benadryl Schedule Solu-Medrol Scheduled H2 blocker Monitor breathing closely Continuous pulse oximetry    Sinus tachycardia Patient heart rate was up to 160s.   Etiology is not clear.   Patient has history of thyroid disease, but she does not know which type of thyroid disease.  Patient is not taking medications.   TSH is 0.257, therefore may have contributed partially.   Patient has history of pheochromocytoma, but the patient is s/p of adrenal gland removal, less likely to play a role here. Plan: -Continue home metoprolol 75 mg twice daily -As needed IV metoprolol   Thyroid disease  TSH 0.257 Free T4 1.16, mild elevation   Diabetes mellitus without complication (HCC)  Patient is taking Levemir 14 units twice daily at home Plan: -SSI -Increase Semglee to 14 units twice daily per home dose -Follow-up A1c, pending   Hypertension -- Blood pressure medications were held for 24 hours after admission Plan: Restart amlodipine 10 mg daily Restart lisinopril 40 mg daily Continue metoprolol 75 mg twice daily As needed IV hydralazine Titrate blood pressure regimen as necessary   CAD (coronary artery disease)  S/p of CABG -Aspirin, Lipitor, Brilinta   Pheochromocytoma  S/p of right adrenal gland removal surgery -No acute  issues   Depression -Continue home medications   HLD (hyperlipidemia) -Lipitor   Nausea & vomiting  Etiology is not clear.   Patient does not  have abdominal pain.   CT scan did not show acute intra-abdominal issues. Plan: IV fluids Liquid diet As needed antiemetics    CT-abd/pelvis: Patient appears to be status post hysterectomy. 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  Aphasia   DVT prophylaxis: SQ heparin Code Status: Full Family Communication: daughter Lori [name] [phone] on 12/17 Disposition Plan: Status is: Inpatient  Remains inpatient appropriate because: Suspected acute CVA.  Work-up in progress       Level of care: Telemetry Medical  Consultants:  Neurology  Procedures:  None  Antimicrobials: None   Subjective: Seen and examined.  Answers questions appropriately.  Speech dysarthric.  Objective: Vitals:   01/20/21 1000 01/20/21 1100 01/20/21 1200 01/20/21 1300  BP: (!) 151/116 (!) 157/114 (!) 157/111 (!) 163/105  Pulse: (!) 108 (!) 107 (!) 108 (!) 108  Resp: (!) 26 (!) 22 (!) 22 (!) 21  Temp:      TempSrc:      SpO2: 94% 95% 95% 96%  Weight:      Height:        Intake/Output Summary (Last 24 hours) at 01/20/2021 1409 Last data filed at 01/20/2021 1400 Gross per 24 hour  Intake 1080.14 ml  Output 2200 ml  Net -1119.86 ml   Filed Weights   01/19/21 0838 01/20/21 0049  Weight: 102.6 kg 97.7 kg    Examination:  General exam: Mild distress due to retching Respiratory system: Lungs clear.  Normal work of breathing.  Room air Cardiovascular system: Tachycardic, regular rhythm, no murmurs, no pedal edema Gastrointestinal system: Soft, NT/ND, normal bowel sounds Central nervous system: Alert, oriented x3.  Dysarthric speech Extremities: BLE weakness Skin: No rashes, lesions or ulcers Psychiatry: Judgement and insight appear normal. Mood & affect appropriate.     Data Reviewed: I have personally reviewed following labs and imaging studies  CBC: Recent Labs  Lab 01/19/21 0743  WBC 8.0  NEUTROABS 5.9  HGB 13.0  HCT 39.2   MCV 79.8*  PLT 786   Basic Metabolic Panel: Recent Labs  Lab 01/19/21 0743 01/19/21 0807  NA 132*  --   K 3.9  --   CL 96*  --   CO2 24  --   GLUCOSE 336*  --   BUN 12  --   CREATININE 0.70 0.60  CALCIUM 9.1  --    GFR: Estimated Creatinine Clearance: 93.8 mL/min (by C-G formula based on SCr of 0.6 mg/dL). Liver Function Tests: Recent Labs  Lab 01/19/21 0743  AST 56*  ALT 61*  ALKPHOS 90  BILITOT 0.9  PROT 8.0  ALBUMIN 3.7   Recent Labs  Lab 01/20/21 0709  LIPASE 110*   No results for input(s): AMMONIA in the last 168 hours. Coagulation Profile: Recent Labs  Lab 01/19/21 0743  INR 1.0   Cardiac Enzymes: No results for input(s): CKTOTAL, CKMB, CKMBINDEX, TROPONINI in the last 168 hours. BNP (last 3 results) No results for input(s): PROBNP in the last 8760 hours. HbA1C: No results for input(s): HGBA1C in the last 72 hours. CBG: Recent Labs  Lab 01/19/21 1346 01/19/21 1739 01/19/21 2152 01/20/21 0751 01/20/21 1105  GLUCAP 285* 322* 314* 328* 367*   Lipid Profile: Recent Labs    01/20/21 0709  CHOL 165  HDL 54  LDLCALC 95  TRIG 81  CHOLHDL 3.1   Thyroid Function Tests: Recent Labs    01/19/21 0743 01/20/21 0709  TSH 0.257*  --   FREET4  --  1.16*   Anemia Panel: No results for input(s): VITAMINB12, FOLATE, FERRITIN, TIBC, IRON, RETICCTPCT in the last 72 hours. Sepsis Labs: Recent Labs  Lab 01/19/21 0850 01/19/21 1002  LATICACIDVEN 2.0* 1.4    Recent Results (from the past 240 hour(s))  Blood Culture (routine x 2)     Status: None (Preliminary result)   Collection Time: 01/19/21  8:50 AM   Specimen: BLOOD  Result Value Ref Range Status   Specimen Description BLOOD BLOOD LEFT FOREARM  Final   Special Requests   Final    BOTTLES DRAWN AEROBIC AND ANAEROBIC Blood Culture results may not be optimal due to an inadequate volume of blood received in culture bottles   Culture   Final    NO GROWTH < 24 HOURS Performed at [facility], [address] [place], [place] [address]    Report Status PENDING  Incomplete  Resp Panel by RT-PCR (Flu A&B, Covid) Nasopharyngeal Swab     Status: None   Collection Time: 01/19/21  9:44 AM   Specimen: Nasopharyngeal Swab; Nasopharyngeal(NP) swabs in vial transport medium  Result Value Ref Range Status   SARS Coronavirus 2 by RT PCR NEGATIVE NEGATIVE Final    Comment: (NOTE) SARS-CoV-2 target nucleic acids are NOT DETECTED.  The SARS-CoV-2 RNA is generally detectable in upper respiratory specimens during the acute phase of infection. The lowest concentration of SARS-CoV-2 viral copies this assay can detect is 138 copies/mL. A negative result does not preclude SARS-Cov-2 infection and should not be used as the sole basis for treatment or other patient management decisions. A negative result may occur with  improper specimen collection/handling, submission of specimen other than nasopharyngeal swab, presence of viral mutation(s) within the areas targeted by this assay, and inadequate number of viral copies(<138 copies/mL). A negative result must be combined with clinical observations, patient history, and epidemiological information. The expected result is Negative.  Fact Sheet for Patients:  EntrepreneurPulse.com.au  Fact Sheet for Healthcare Providers:  IncredibleEmployment.be  This test is no t yet approved or cleared by the [place] FDA and  has been authorized for detection and/or diagnosis of SARS-CoV-2 by FDA under an Emergency Use Authorization (EUA). This EUA will remain  in effect (meaning this test can be used) for the duration of the COVID-19 declaration under Section 564(b)(1) of the Act, 21 U.S.C.section 360bbb-3(b)(1), unless the authorization is terminated  or revoked sooner.       Influenza A by PCR NEGATIVE NEGATIVE Final   Influenza B by PCR NEGATIVE NEGATIVE Final    Comment: (NOTE) The Xpert Xpress  SARS-CoV-2/FLU/RSV plus assay is intended as an aid in the diagnosis of influenza from Nasopharyngeal swab specimens and should not be used as a sole basis for treatment. Nasal washings and aspirates are unacceptable for Xpert Xpress SARS-CoV-2/FLU/RSV testing.  Fact Sheet for Patients: EntrepreneurPulse.com.au  Fact Sheet for Healthcare Providers: IncredibleEmployment.be  This test is not yet approved or cleared by the [place] FDA and has been authorized for detection and/or diagnosis of SARS-CoV-2 by FDA under an Emergency Use Authorization (EUA). This EUA will remain in effect (meaning this test can be used) for the duration of the COVID-19 declaration under Section 564(b)(1) of the Act, 21 U.S.C. section 360bbb-3(b)(1), unless the authorization is terminated or revoked.  Performed at [facility], [address] [place], [place] [address]   Culture, blood (Routine X 2) w Reflex to ID Panel     Status: None (Preliminary result)   Collection Time: 01/19/21 10:02 AM   Specimen: BLOOD  Result Value Ref Range Status   Specimen Description BLOOD RIGHT FOA  Final   Special Requests   Final    BOTTLES DRAWN AEROBIC AND ANAEROBIC Blood Culture adequate volume   Culture   Final    NO GROWTH < 24 HOURS Performed at [facility], [address] [place], [place] [address]    Report Status PENDING  Incomplete         Radiology Studies: CT ABDOMEN PELVIS WO CONTRAST  Result Date: 01/19/2021 CLINICAL DATA:  Nausea, vomiting, abdominal pain. EXAM: CT ABDOMEN AND PELVIS WITHOUT CONTRAST TECHNIQUE: Multidetector CT imaging of the abdomen and pelvis was performed following the standard protocol without IV contrast. COMPARISON:  None. FINDINGS: Lower chest: Small sliding-type hiatal hernia. Visualized lung bases are unremarkable. Hepatobiliary: No focal liver abnormality is seen. No gallstones, gallbladder wall thickening, or  biliary dilatation. Pancreas: Unremarkable. No pancreatic ductal dilatation or surrounding inflammatory changes. Spleen: Normal in size without focal abnormality. Adrenals/Urinary Tract: Adrenal glands are unremarkable. Kidneys are normal, without renal calculi, focal lesion, or hydronephrosis. Bladder is unremarkable. Stomach/Bowel: Stomach is within normal limits. Appendix appears normal. No evidence of bowel wall thickening, distention, or inflammatory changes. Vascular/Lymphatic: Aortic atherosclerosis. No enlarged abdominal or pelvic lymph nodes. Reproductive: Patient appears to be status post hysterectomy. No definite adnexal abnormality is noted. 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 Other: No abdominal wall hernia or abnormality. No abdominopelvic ascites. Musculoskeletal: No acute or significant osseous findings. IMPRESSION: Patient appears to be status post hysterectomy. 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 Small sliding-type hiatal hernia. Aortic Atherosclerosis (ICD10-I70.0). Electronically Signed   By: Marijo Conception M.D.   On: 01/19/2021 13:21   DG Chest Port 1 View  Result Date: 01/19/2021 CLINICAL DATA:  Sepsis. EXAM: PORTABLE CHEST 1 VIEW COMPARISON:  None. FINDINGS: The heart size and mediastinal contours are within normal limits. Both lungs are clear. Sternotomy wires are noted. The visualized skeletal structures are unremarkable. IMPRESSION: No active disease. Electronically Signed   By: Marijo Conception M.D.   On: 01/19/2021 09:18   CT HEAD CODE STROKE WO CONTRAST  Result Date: 01/19/2021 CLINICAL DATA:  Code stroke. Generalized weakness. Aphasia and left-sided deficits from prior stroke EXAM: CT HEAD WITHOUT CONTRAST TECHNIQUE: Contiguous axial images were obtained from the base of the skull through the vertex without intravenous contrast. COMPARISON:  None. FINDINGS:  Brain: Confluent low-density in the cerebral white matter with superimposed chronic lacunar infarcts at the deep gray nuclei and deep white matter tracks. 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 Premature brain atrophy. No acute hemorrhage, hydrocephalus, collection, or masslike finding. Brain atrophy especially affecting the cerebellum. Vascular: No hyperdense vessel. Premature atheromatous calcification. Skull: Normal. Negative for fracture or focal lesion. Sinuses/Orbits: No acute finding. Other: These results were called by telephone at the time of interpretation on 01/19/2021 at 7:59 am to provider Merlyn Lot , who verbally acknowledged these results. ASPECTS [facility] Stroke Program Early CT Score) No acute infarct. IMPRESSION: 1. No acute finding. 2. 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 Electronically Signed   By: Jorje Guild M.D.   On: 01/19/2021 08:01   CT ANGIO HEAD CODE STROKE  Result Date: 01/19/2021 CLINICAL DATA:  Generalized weakness with aphasia. Left-sided deficits. Vomiting. EXAM: CT ANGIOGRAPHY HEAD AND NECK TECHNIQUE: Multidetector CT imaging of the head and neck was performed using the standard protocol during bolus administration of intravenous contrast. Multiplanar CT image reconstructions and MIPs were obtained to evaluate the vascular anatomy. Carotid stenosis measurements (when applicable) are obtained utilizing NASCET criteria, using the distal internal carotid diameter as the denominator. CONTRAST:  41mL OMNIPAQUE IOHEXOL 350 MG/ML SOLN COMPARISON:  None. FINDINGS: CTA NECK FINDINGS Aortic arch: Atheromatous plaque. 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 No dissection or ulceration. Left carotid system: Age advanced atheromatous plaque  along the common carotid and proximal ICA without flow limiting stenosis or ulceration. Vertebral arteries: Aberrant right subclavian artery. Significant for age atheromatous plaque at the bilateral proximal subclavian, but without flow limiting stenosis. Heavily diseased vertebral arteries with extensive calcific plaque and multifocal narrowing. The origins are difficult to assess due to small vessel size, calcified plaque, and body habitus. Both vertebral arteries are patent at the dura although there is subsequent occlusion on the left. Skeleton: No acute finding Other neck: No acute finding Upper chest: No acute finding Review of the MIP images confirms the above findings CTA HEAD FINDINGS Anterior circulation: Heavily diseased carotid siphon with calcified plaque and multifocal luminal stenosis. The degree of calcified plaque limits lumen measurement, expect flow limiting stenosis on both sides. 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 Extensive atheromatous plaque affecting the M1 segments and MCA branches. Moderate narrowing at the bilateral M1 segment. Negative for aneurysm. Posterior circulation: Heavily diseased vertebral arteries with calcified plaque. Severe right vertebral stenosis beyond the PICA. Left proximal V4 segment occlusion with potentially retrograde flow seen at the proximal left PICA. Narrow basilar diffusely with 2 segments of non enhancement. Fetal type bilateral PCA flow. Venous sinuses: No emergent finding Anatomic variants: As above Review of the MIP images confirms the above findings Critical Value/emergent results were called by telephone at the time of interpretation on 01/19/2021 at 8:42 am to provider ERIC [facility] , who verbally acknowledged these results. IMPRESSION: 1. Generalized and severe atherosclerosis, especially for age. 2. Dominant findings in the posterior circulation where there are left vertebral and basilar occlusions which are age  indeterminate. Severe right V4 segment stenosis. 3. No flow limiting stenosis in the cervical carotid circulation. 4. Probable flow limiting stenosis at the carotid siphons, especially on the right. Electronically Signed   By: Jorje Guild M.D.   On: 01/19/2021 08:50   CT ANGIO NECK CODE STROKE  Result Date: 01/19/2021 CLINICAL DATA:  Generalized weakness with aphasia. Left-sided deficits. Vomiting. EXAM: CT ANGIOGRAPHY HEAD AND NECK TECHNIQUE: Multidetector CT imaging of the head and neck was performed using the standard protocol during bolus administration of intravenous contrast. Multiplanar CT image reconstructions and MIPs were obtained to evaluate the vascular anatomy. Carotid stenosis measurements (when applicable) are obtained utilizing NASCET criteria, using the distal internal carotid diameter as the denominator. CONTRAST:  31mL OMNIPAQUE IOHEXOL 350 MG/ML SOLN COMPARISON:  None. FINDINGS: CTA NECK FINDINGS Aortic arch: Atheromatous plaque. 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 No dissection or ulceration. Left  carotid system: Age advanced atheromatous plaque along the common carotid and proximal ICA without flow limiting stenosis or ulceration. Vertebral arteries: Aberrant right subclavian artery. Significant for age atheromatous plaque at the bilateral proximal subclavian, but without flow limiting stenosis. Heavily diseased vertebral arteries with extensive calcific plaque and multifocal narrowing. The origins are difficult to assess due to small vessel size, calcified plaque, and body habitus. Both vertebral arteries are patent at the dura although there is subsequent occlusion on the left. Skeleton: No acute finding Other neck: No acute finding Upper chest: No acute finding Review of the MIP images  confirms the above findings CTA HEAD FINDINGS Anterior circulation: Heavily diseased carotid siphon with calcified plaque and multifocal luminal stenosis. The degree of calcified plaque limits lumen measurement, expect flow limiting stenosis on both sides. 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 Extensive atheromatous plaque affecting the M1 segments and MCA branches. Moderate narrowing at the bilateral M1 segment. Negative for aneurysm. Posterior circulation: Heavily diseased vertebral arteries with calcified plaque. Severe right vertebral stenosis beyond the PICA. Left proximal V4 segment occlusion with potentially retrograde flow seen at the proximal left PICA. Narrow basilar diffusely with 2 segments of non enhancement. Fetal type bilateral PCA flow. Venous sinuses: No emergent finding Anatomic variants: As above Review of the MIP images confirms the above findings Critical Value/emergent results were called by telephone at the time of interpretation on 01/19/2021 at 8:42 am to provider ERIC [facility] , who verbally acknowledged these results. IMPRESSION: 1. Generalized and severe atherosclerosis, especially for age. 2. Dominant findings in the posterior circulation where there are left vertebral and basilar occlusions which are age indeterminate. Severe right V4 segment stenosis. 3. No flow limiting stenosis in the cervical carotid circulation. 4. Probable flow limiting stenosis at the carotid siphons, especially on the right. Electronically Signed   By: Jorje Guild M.D.   On: 01/19/2021 08:50        Scheduled Meds:   stroke: mapping our early stages of recovery book   Does not apply Once   aspirin EC  81 mg Oral Daily   atorvastatin  80 mg Oral Daily   chlorhexidine  15 mL Mouth Rinse BID   Chlorhexidine Gluconate Cloth  6 each Topical Daily   cyclobenzaprine  5 mg Oral TID   docusate sodium  200 mg Oral QHS   escitalopram  20 mg Oral Daily   gabapentin  200 mg Oral TID    heparin  5,000 Units Subcutaneous Q8H   insulin aspart  0-5 Units Subcutaneous QHS   insulin aspart  0-9 Units Subcutaneous TID WC   insulin glargine-yfgn  10 Units Subcutaneous BID   loratadine  10 mg Oral Daily   LORazepam  1 mg Intravenous Once   LORazepam  0.25 mg Oral QHS   mouth rinse  15 mL Mouth Rinse q12n4p   melatonin  10 mg Oral QHS   methylPREDNISolone (SOLU-MEDROL) injection  60 mg Intravenous Q12H   metoprolol tartrate  75 mg Oral BID   pantoprazole (PROTONIX) IV  40 mg Intravenous Q12H   sodium chloride flush  3 mL Intravenous Once   ticagrelor  90 mg Oral BID   Continuous Infusions:  famotidine (PEPCID) IV Stopped (01/20/21 0805)   labetalol (NORMODYNE) infusion 5 mg/mL Stopped (01/20/21 1147)     LOS: 1 day    Time spent: 35 minutes    Sidney Ace, MD [facility]   If 7PM-7AM, please contact night-coverage  01/20/2021, 2:09 PM

## 2021-01-20 NOTE — Progress Notes (Signed)
Inpatient Rehab Admissions Coordinator Note:   Per PT/OT patient was screened for CIR candidacy by Twilla Khouri Danford Bad, CCC-SLP. At this time, pt appears to be a potential candidate for CIR. I will place an order for rehab consult for full assessment, per our protocol.  Please contact me any with questions.Gayland Curry, [place], [place] Admissions Coordinator [phone] 01/20/21 4:50 PM

## 2021-01-20 NOTE — Evaluation (Addendum)
Physical Therapy Evaluation Patient Details Name: Lori [name] MRN: [id number] DOB: 1970/07/07 Today's Date: 01/20/2021  History of Present Illness  Pt is a 50 y/o F admitted on 01/19/21 after presenting with c/c of aphasia, N&V. MRI has been ordered. PMH: HTN, HLD, DM, stroke with L sided weakness, thyroid disease, depression with anxiety, CAD s/p CABG, pheochromocytoma (s/p R adrenal gland removal)  Clinical Impression  Pt seen for PT evaluation with c/o with OT. Pt is able to verbalize short sentences with extra time. Pt reports she recently moved into a 3rd floor apartment & lives with her daughter & son-in-law. Pt reports she was supposed to receive [facility] services but they signed off due to unsafe living situation on the 3rd floor. Pt reports she was ambulatory with rollator in the home but required assistance for bed mobility. On this date, pt requires +2 for supine<>sit but is able to tolerate sitting EOB ~5-7 minutes with CGA<>Min assist. Pt progresses to standing EOB & taking side steps to L with BUE HHA +2 assist with pt able to advance LLE! PT/OT provide blocking at knees & assistance with weight shifting. Pt is very motivated to participate in therapy & would benefit from CIR level of services to maximize independence with all aspects of mobility. Will continue to follow pt acutely to progress mobility.    Addendum: Pt noted visual deficits & upon further examination appears to have L visual field cut in both eyes.     Recommendations for follow up therapy are one component of a multi-disciplinary discharge planning process, led by the attending physician.  Recommendations may be updated based on patient status, additional functional criteria and insurance authorization.  Follow Up Recommendations Acute inpatient rehab (3hours/day)    Assistance Recommended at Discharge Frequent or constant Supervision/Assistance  Functional Status Assessment Patient has had a recent decline in  their functional status and demonstrates the ability to make significant improvements in function in a reasonable and predictable amount of time.  Equipment Recommendations   (TBD)    Recommendations for Other Services       Precautions / Restrictions Precautions Precautions: Fall Precaution Comments: L hemi (UE>LE), L field cut Restrictions Weight Bearing Restrictions: No      Mobility  Bed Mobility Overal bed mobility: Needs Assistance Bed Mobility: Rolling;Sit to Supine;Supine to Sit Rolling: Mod assist;Max assist (assistance to reach over & place UE on bed rail)   Supine to sit: Mod assist;Max assist;+2 for physical assistance;HOB elevated Sit to supine: Max assist;Mod assist;+2 for physical assistance;HOB elevated        Transfers Overall transfer level: Needs assistance Equipment used: 2 person hand held assist Transfers: Sit to/from Stand Sit to Stand: Max assist;+2 physical assistance           General transfer comment: PT/OT blocking knees PRN, pt is able to advance LLE to take 2-3 steps to L along EOB with PT/OT assisting with weight shifting L<>R    Ambulation/Gait                  Stairs            Wheelchair Mobility    Modified Rankin (Stroke Patients Only)       Balance Overall balance assessment: Needs assistance Sitting-balance support: Feet unsupported;Single extremity supported Sitting balance-Leahy Scale: Fair Sitting balance - Comments: CGA<>Min assist static sitting EOB   Standing balance support: During functional activity;Bilateral upper extremity supported Standing balance-Leahy Scale: Poor  Pertinent Vitals/Pain Pain Assessment: Faces Faces Pain Scale: No hurt    Home Living Family/patient expects to be discharged to:: Private residence Living Arrangements: Children Available Help at Discharge: Family;Available 24 hours/day (daughter & son-in-law can assist at d/c,  son-in-law works from home) Type of Home: Apartment Home Access: Stairs to enter   [employer] of Steps: Pt lives on 3rd floor apartment with multiple flights to access. Reports she had EMS assistance getting into this apartment as she recently moved.   Home Layout: One level Home Equipment: Rollator (4 wheels)      Prior Function Prior Level of Function : Needs assist       Physical Assist : Mobility (physical) Mobility (physical): Bed mobility;Stairs   Mobility Comments: Pt reports she was ambulatory with rollator but required assistance for bed mobility.       Hand Dominance   Dominant Hand: Right    Extremity/Trunk Assessment   Upper Extremity Assessment Upper Extremity Assessment: Defer to OT evaluation;LUE deficits/detail LUE Deficits / Details: L hand digits resting in flexed position, unable to actively extend them, little active movement noted in LUE    Lower Extremity Assessment Lower Extremity Assessment: LLE deficits/detail LLE Deficits / Details: Pt able to weight bear through LLE during standing, grossly 3/5, PT does block knee during standing. Clonus observed during standing.       Communication   Communication: No difficulties;Expressive difficulties (extra time to verbalize a few words at a time but able to understand all questions/cuing)  Cognition Arousal/Alertness: Awake/alert Behavior During Therapy: WFL for tasks assessed/performed Overall Cognitive Status: Within Functional Limits for tasks assessed                                 General Comments: Able to follow commands with time, answers all questions/PLOF appropriately.        General Comments General comments (skin integrity, edema, etc.): PT/OT assisted with donning clean gown & changing bed linens 2/2 urinary incontinence.    Exercises     Assessment/Plan    PT Assessment Patient needs continued PT services  PT Problem List Decreased strength;Decreased  mobility;Decreased range of motion;Decreased activity tolerance;Decreased balance;Decreased knowledge of use of DME;Decreased knowledge of precautions;Decreased coordination       PT Treatment Interventions DME instruction;Therapeutic exercise;Wheelchair mobility training;Gait training;Balance training;Manual techniques;Stair training;Neuromuscular re-education;Modalities;Functional mobility training;Cognitive remediation;Therapeutic activities;Patient/family education    PT Goals (Current goals can be found in the Care Plan section)  Acute Rehab PT Goals Patient Stated Goal: increase independence with mobility PT Goal Formulation: With patient Time For Goal Achievement: 02/03/21 Potential to Achieve Goals: Good    Frequency 7X/week   Barriers to discharge Inaccessible home environment lives in 3rd floor apartment    Co-evaluation PT/OT/SLP Co-Evaluation/Treatment: Yes Reason for Co-Treatment: Necessary to address cognition/behavior during functional activity;Complexity of the patient's impairments (multi-system involvement);To address functional/ADL transfers;For patient/therapist safety PT goals addressed during session: Mobility/safety with mobility;Balance;Proper use of DME;Strengthening/ROM         AM-PAC PT "6 Clicks" Mobility  Outcome Measure Help needed turning from your back to your side while in a flat bed without using bedrails?: A Lot Help needed moving from lying on your back to sitting on the side of a flat bed without using bedrails?: Total Help needed moving to and from a bed to a chair (including a wheelchair)?: Total Help needed standing up from a chair using your arms (e.g., wheelchair or  bedside chair)?: Total Help needed to walk in hospital room?: Total Help needed climbing 3-5 steps with a railing? : Total 6 Click Score: 7    End of Session Equipment Utilized During Treatment: Gait belt Activity Tolerance: Patient tolerated treatment well Patient left: in  bed;with call bell/phone within reach;with bed alarm set Nurse Communication: Mobility status (L visual field cut) PT Visit Diagnosis: Unsteadiness on feet (R26.81);Muscle weakness (generalized) (M62.81);Difficulty in walking, not elsewhere classified (R26.2);Hemiplegia and hemiparesis Hemiplegia - Right/Left: Left Hemiplegia - dominant/non-dominant: Non-dominant Hemiplegia - caused by: Unspecified (awaiting MRI results)    Time: 1115-5208 PT Time Calculation (min) (ACUTE ONLY): 32 min   Charges:   PT Evaluation $PT Eval High Complexity: 1 High PT Treatments $Neuromuscular Re-education: 8-22 mins        Lavone Nian, PT, DPT 01/20/21, 1:18 PM   Waunita Schooner 01/20/2021, 1:14 PM

## 2021-01-20 NOTE — Evaluation (Signed)
Occupational Therapy Evaluation Patient Details Name: Lori [name] MRN: [id number] DOB: [birth year] Today's Date: 01/20/2021   History of Present Illness Pt is a 50 y/o F admitted on 01/19/21 after presenting with c/c of aphasia, N&V. MRI has been ordered. PMH: HTN, HLD, DM, stroke with L sided weakness, thyroid disease, depression with anxiety, CAD s/p CABG, pheochromocytoma (s/p R adrenal gland removal)   Clinical Impression   Pt seen for OT evaluation this date in setting of acute hospitalization d/t aphasia. Pt reports living with dtr and SIL in third story apt. She reports she was supposed to get [facility] assistance, but they ruled that her living situation was unsafe. She presents this date with decreased L UE/L LE ROM, strength and coordination. While she does have a h/o strokes, she reports this is worse than her baseline. Pt requires MAX A +2 to CTS and take some small steps to her L at bed side. She is noted to have significant difficulty shifting weight onto her L LE. She requires MIN/MOD A to don clean gown while seated EOB as well as visual cues for L lateral scanning. Once returned to bed, requires MOD A +2 for lateral rolling to address peri care. Left with all needs met and in reach. Her BP was somewhat elevated before/during/after session and RN updated and aware. Will continue to follow acutely. Recommending CIR f/u OT services to improve L FMC, visual scanning skills, and transfer safety/strength to improve pt's opportunity to thrive in the home environment. That said, she is encouraged to communicate to her daughter that a third story apartment is unrealistic to return to for emergency preparedness as pt would be limited in her ability to exit the building. Will f/u with CM as well.      Recommendations for follow up therapy are one component of a multi-disciplinary discharge planning process, led by the attending physician.  Recommendations may be updated based on patient status,  additional functional criteria and insurance authorization.   Follow Up Recommendations  Acute inpatient rehab (3hours/day)    Assistance Recommended at Discharge Frequent or constant Supervision/Assistance  Functional Status Assessment  Patient has had a recent decline in their functional status and demonstrates the ability to make significant improvements in function in a reasonable and predictable amount of time.  Equipment Recommendations  Other (comment) (defer to next level of care)    Recommendations for Other Services Rehab consult     Precautions / Restrictions Precautions Precautions: Fall Precaution Comments: L hemi (UE>LE), L field cut Restrictions Weight Bearing Restrictions: No      Mobility Bed Mobility Overal bed mobility: Needs Assistance Bed Mobility: Rolling;Sit to Supine;Supine to Sit Rolling: Mod assist;Max assist (assistance to reach over & place UE on bed rail)   Supine to sit: Mod assist;Max assist;+2 for physical assistance;HOB elevated Sit to supine: Max assist;Mod assist;+2 for physical assistance;HOB elevated        Transfers Overall transfer level: Needs assistance Equipment used: 2 person hand held assist Transfers: Sit to/from Stand Sit to Stand: Max assist;+2 physical assistance           General transfer comment: PT/OT blocking knees PRN, pt is able to advance LLE to take 2-3 steps to L along EOB with PT/OT assisting with weight shifting L<>R      Balance Overall balance assessment: Needs assistance Sitting-balance support: Feet unsupported;Single extremity supported Sitting balance-Leahy Scale: Fair Sitting balance - Comments: CGA<>Min assist static sitting EOB   Standing balance support: During functional activity;Bilateral  upper extremity supported Standing balance-Leahy Scale: Poor                             ADL either performed or assessed with clinical judgement   ADL Overall ADL's : Needs  assistance/impaired                                       General ADL Comments: SETUP for grooming/self feedingin supported sitting, MIN/MOD A for UB dressing in supported sitting. MAX/TOTAL A for all LB ADLs bed level or sitting, unable to contribute to LB ADLs in standing d/t weakness in L side     Vision   Vision Assessment?: Yes Eye Alignment: Within Functional Limits Ocular Range of Motion: Within Functional Limits Tracking/Visual Pursuits: Requires cues, head turns, or add eye shifts to track (she cannot track to L side with R or LE eye, appears to have L visual field cut) Convergence: Within functional limits     Perception     Praxis      Pertinent Vitals/Pain Pain Assessment: Faces Faces Pain Scale: No hurt     Hand Dominance Right   Extremity/Trunk Assessment Upper Extremity Assessment Upper Extremity Assessment: RUE deficits/detail;LUE deficits/detail;Generalized weakness RUE Deficits / Details: ROM WFL, MMT grossly 4-/5 LUE Deficits / Details: L hand digits resting in flexed position, able to tolerate AAROM/PROM into near-neutral position with increased time. 1/4 range shld flexion against gravity on L side.   Lower Extremity Assessment Lower Extremity Assessment: Defer to PT evaluation;Generalized weakness LLE Deficits / Details: Pt able to weight bear through LLE during standing, grossly 3/5, PT does block knee during standing. Clonus observed during standing.       Communication Communication Communication: Expressive difficulties;No difficulties (extra time to verbalize a few words (slurring) at a time but able to understand all questions/cuing, also throat hurting 2/2 acid reflux)   Cognition Arousal/Alertness: Awake/alert Behavior During Therapy: WFL for tasks assessed/performed Overall Cognitive Status: Within Functional Limits for tasks assessed                                 General Comments: Able to follow commands  with time, answers all questions/PLOF appropriately. increased time to process/respond     General Comments  PT/OT assisted with donning clean gown & changing bed linens 2/2 urinary incontinence.    Exercises Other Exercises Other Exercises: OT ed with pt re: role, dc recs, DME recs   Shoulder Instructions      Home Living Family/patient expects to be discharged to:: Private residence Living Arrangements: Children Available Help at Discharge: Family;Available 24 hours/day (dtr and SIL can assist) Type of Home: Apartment Home Access: Stairs to enter Entrance Stairs-Number of Steps: Pt lives on 3rd floor apartment with multiple flights to access. Reports she had EMS assistance getting into this apartment as she recently moved.   Home Layout: One level     Bathroom Shower/Tub: [employer]: Rollator (4 wheels);Hand held shower head          Prior Functioning/Environment Prior Level of Function : Needs assist       Physical Assist : Mobility (physical);ADLs (physical) Mobility (physical): Bed mobility;Stairs ADLs (physical): Bathing;Dressing;Toileting;IADLs Mobility Comments: Pt reports she was ambulatory with rollator but  required assistance for bed mobility. ADLs Comments: assist for bathing/dressing from dtr        OT Problem List: Decreased strength;Decreased range of motion;Decreased activity tolerance;Impaired balance (sitting and/or standing);Impaired vision/perception;Decreased coordination;Decreased knowledge of use of DME or AE;Impaired tone;Impaired UE functional use      OT Treatment/Interventions: Self-care/ADL training;Therapeutic exercise;DME and/or AE instruction;Therapeutic activities;Patient/family education;Balance training;Neuromuscular education;Visual/perceptual remediation/compensation    OT Goals(Current goals can be found in the care plan section) Acute Rehab OT Goals Patient Stated Goal: to get stronger OT Goal  Formulation: With patient Time For Goal Achievement: 02/03/21 Potential to Achieve Goals: Good ADL Goals Pt Will Perform Upper Body Dressing: with supervision;sitting (mod technique) Pt Will Perform Lower Body Dressing: with mod assist;sit to/from stand;with caregiver independent in assisting (for clothing mgt over hips) Pt Will Transfer to Toilet: with min assist;stand pivot transfer;bedside commode Additional ADL Goal #1: Pt will use scanning technique to track tray items to her L with no verbal cues on 4/5 trials Additional ADL Goal #2: Pt will complete L UE Medley task with success on 3/5 trials aeb decreased spillage of small manipulatives from hand (<20%)  OT Frequency: Min 3X/week   Barriers to D/C: Inaccessible home environment          Co-evaluation PT/OT/SLP Co-Evaluation/Treatment: Yes Reason for Co-Treatment: Complexity of the patient's impairments (multi-system involvement);For patient/therapist safety;Necessary to address cognition/behavior during functional activity;To address functional/ADL transfers PT goals addressed during session: Mobility/safety with mobility;Proper use of DME;Strengthening/ROM OT goals addressed during session: ADL's and self-care;Proper use of Adaptive equipment and DME      AM-PAC OT "6 Clicks" Daily Activity     Outcome Measure Help from another person eating meals?: A Little Help from another person taking care of personal grooming?: A Little Help from another person toileting, which includes using toliet, bedpan, or urinal?: A Lot Help from another person bathing (including washing, rinsing, drying)?: A Lot Help from another person to put on and taking off regular upper body clothing?: A Lot Help from another person to put on and taking off regular lower body clothing?: Total 6 Click Score: 13   End of Session Equipment Utilized During Treatment: Gait belt Nurse Communication: Mobility status  Activity Tolerance: Patient tolerated treatment  well Patient left: in bed;with call bell/phone within reach;with bed alarm set  OT Visit Diagnosis: Unsteadiness on feet (R26.81);Muscle weakness (generalized) (M62.81);Other symptoms and signs involving the nervous system (R29.898);Hemiplegia and hemiparesis Hemiplegia - Right/Left: Left Hemiplegia - dominant/non-dominant: Non-Dominant Hemiplegia - caused by: Cerebral infarction                Time: 1101-1134 OT Time Calculation (min): 33 min Charges:  OT General Charges $OT Visit: 1 Visit OT Evaluation $OT Eval Moderate Complexity: 1 Mod OT Treatments $Self Care/Home Management : 8-22 mins  Gerrianne Scale, MS, OTR/L ascom [phone] 01/20/21, 2:21 PM

## 2021-01-21 ENCOUNTER — Inpatient Hospital Stay: Payer: Medicare Other

## 2021-01-21 ENCOUNTER — Inpatient Hospital Stay (HOSPITAL_COMMUNITY)
Admit: 2021-01-21 | Discharge: 2021-01-21 | Disposition: A | Discharge status: Home / Self Care | Payer: Medicare Other | Attending: Internal Medicine | Admitting: Internal Medicine

## 2021-01-21 DIAGNOSIS — I639 Cerebral infarction, unspecified: Secondary | ICD-10-CM | POA: Diagnosis not present

## 2021-01-21 DIAGNOSIS — I6389 Other cerebral infarction: Secondary | ICD-10-CM

## 2021-01-21 LAB — GLUCOSE, CAPILLARY
Glucose-Capillary: 322 mg/dL — ABNORMAL HIGH (ref 70–99)
Glucose-Capillary: 323 mg/dL — ABNORMAL HIGH (ref 70–99)
Glucose-Capillary: 353 mg/dL — ABNORMAL HIGH (ref 70–99)
Glucose-Capillary: 360 mg/dL — ABNORMAL HIGH (ref 70–99)
Glucose-Capillary: 366 mg/dL — ABNORMAL HIGH (ref 70–99)

## 2021-01-21 LAB — ECHOCARDIOGRAM COMPLETE
AR max vel: 1.53 cm2
AV Peak grad: 8 mmHg
Ao pk vel: 1.41 m/s
Area-P 1/2: 6.43 cm2
Height: 62 in
S' Lateral: 2.1 cm
Weight: 3446.23 oz

## 2021-01-21 IMAGING — MR MR THORACIC SPINE W/O CM
6 series · 31 of 48 positions shown · non-contrast
Comparison: None.

CLINICAL DATA: Demyelinating disease

EXAM:
MRI THORACIC SPINE WITHOUT CONTRAST
TECHNIQUE: Multiplanar, multisequence MR imaging of the thoracic spine was
performed. No intravenous contrast was administered.

[Series 17: T1 · sagittal · 5.0mm · 1.41mm/px · 4 of 9 slices shown (1 of 2)]
[im 1/9]
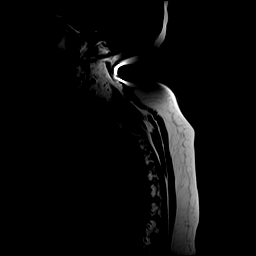
[im 3/9]
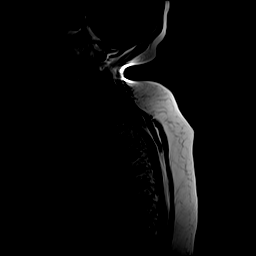
[im 6/9]
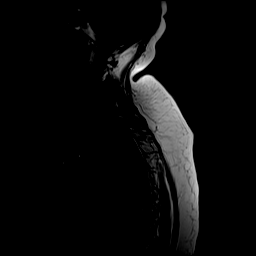
[im 9/9]
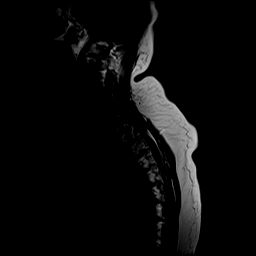

[Series 18: T2 · sagittal · 3.0mm · 1.06mm/px · 6 of 18 slices shown (1 of 2)]
[im 1/18]
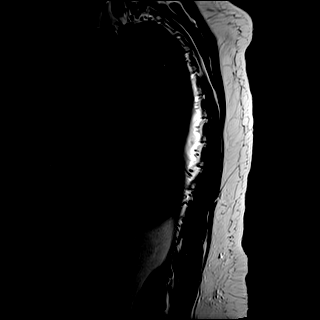
[im 4/18]
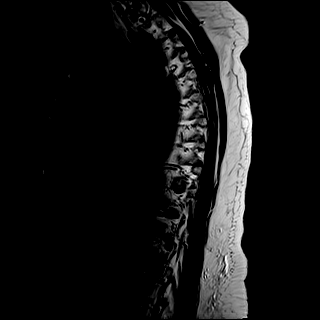
[im 7/18]
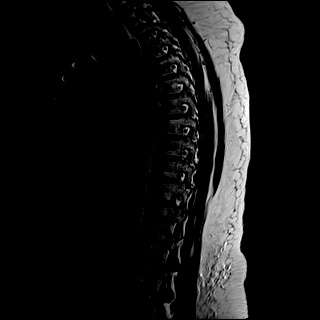
[im 11/18]
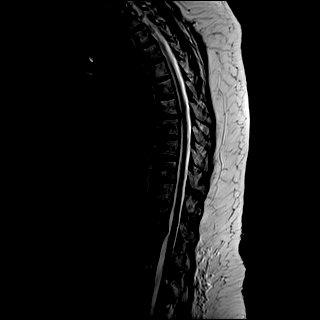
[im 14/18]
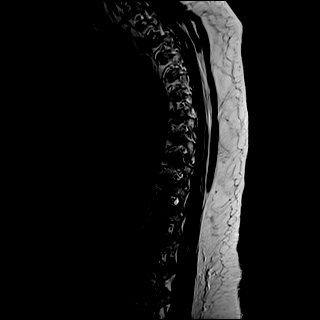
[im 18/18]
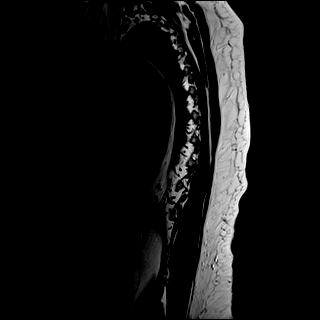

[Series 19: T1 · sagittal · 3.0mm · 1.06mm/px · 5 of 18 slices shown (2 of 2)]
[im 1/18]
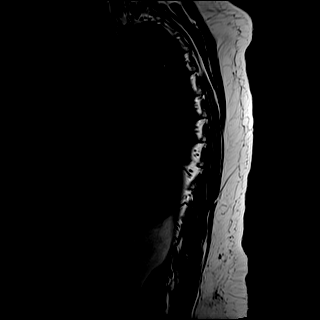
[im 5/18]
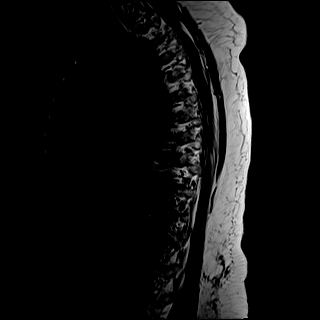
[im 9/18]
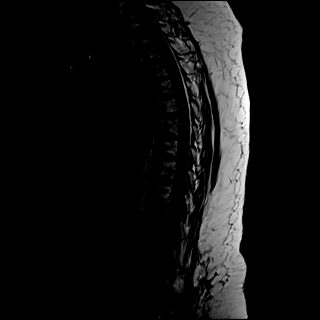
[im 13/18]
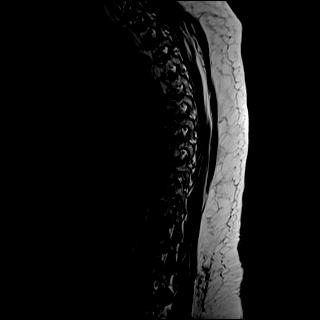
[im 18/18]
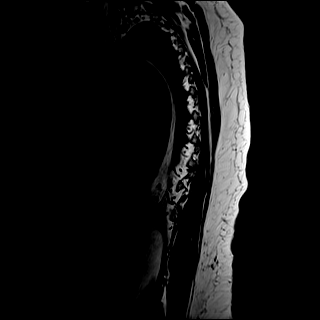

[Series 20: STIR · sagittal · 3.0mm · 0.53mm/px · 5 of 18 slices shown]
[im 1/18]
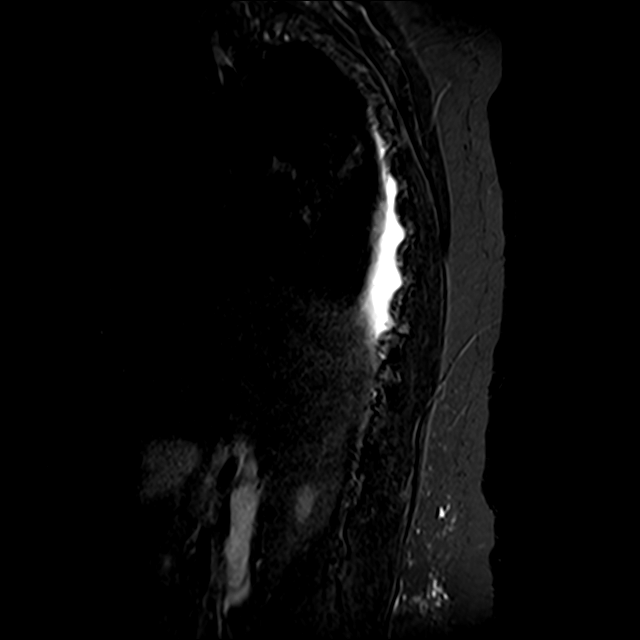
[im 5/18]
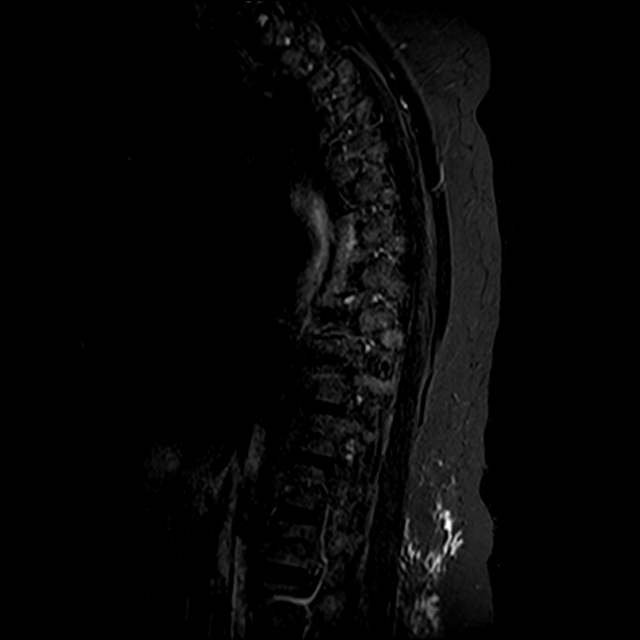
[im 9/18]
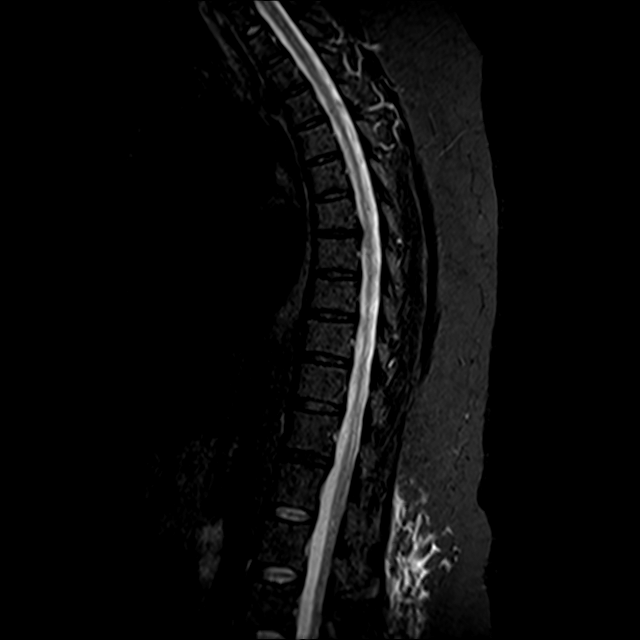
[im 13/18]
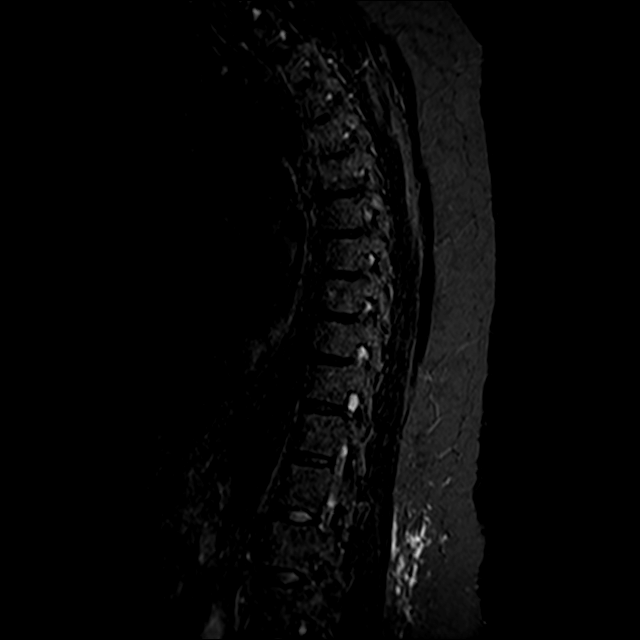
[im 18/18]
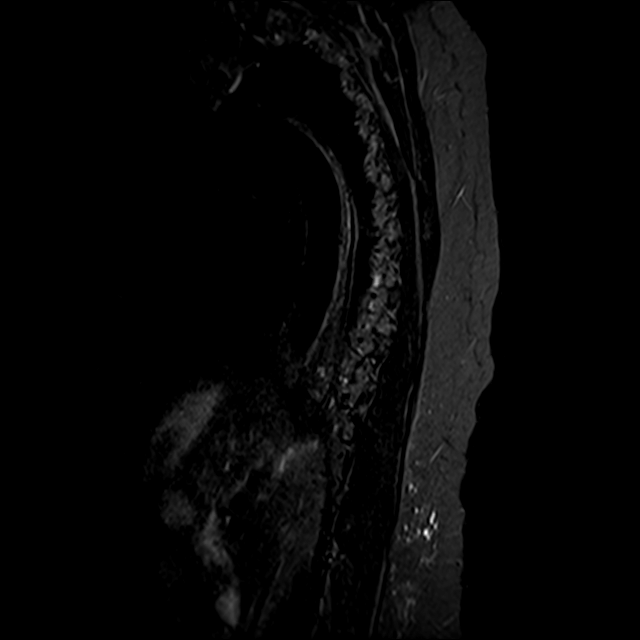

[Series 21: T2 · axial · 4.0mm · 0.59mm/px · z∈[-199,+62]mm · 8 of 48 slices shown (2 of 2)]
[im 1/48]
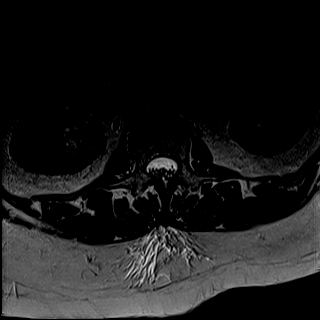
[im 8/48]
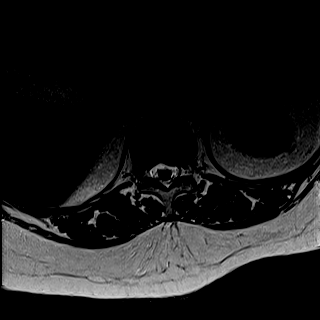
[im 15/48]
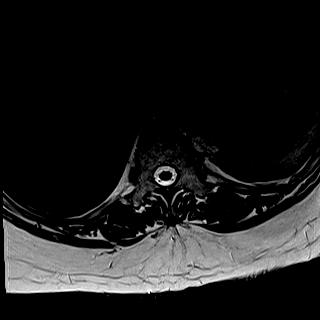
[im 22/48]
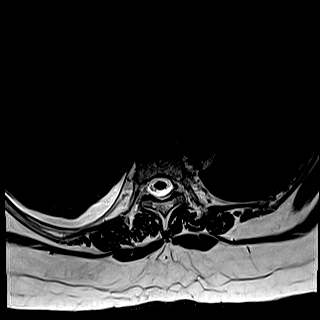
[im 26/48]
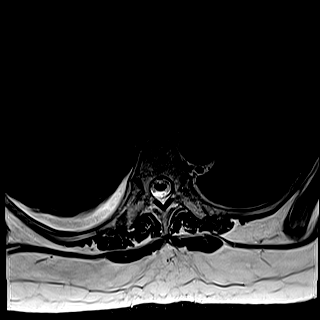
[im 33/48]
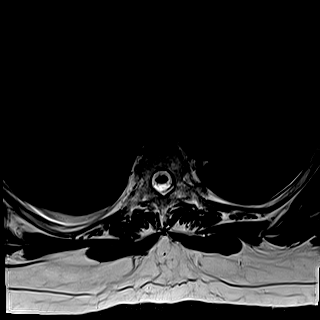
[im 40/48]
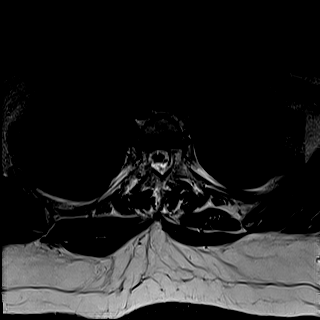
[im 48/48]
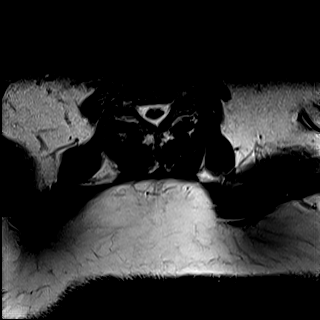

[Series 22: GRE · axial · 4.0mm · 0.37mm/px · z∈[-199,-116]mm · 3 of 48 slices shown]
[im 1/48]
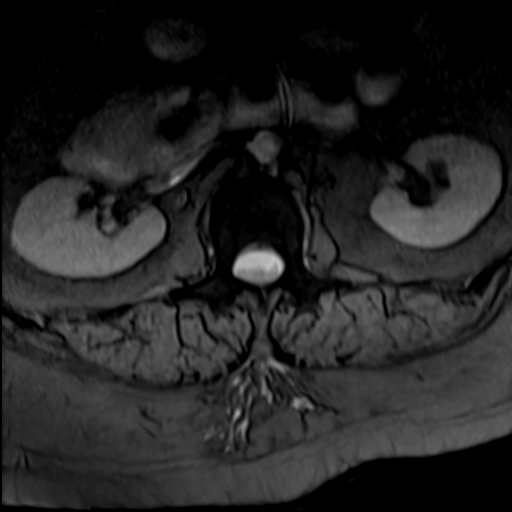
[im 8/48]
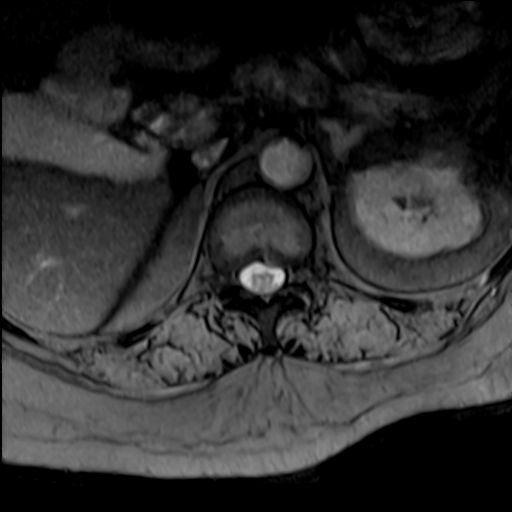
[im 15/48]
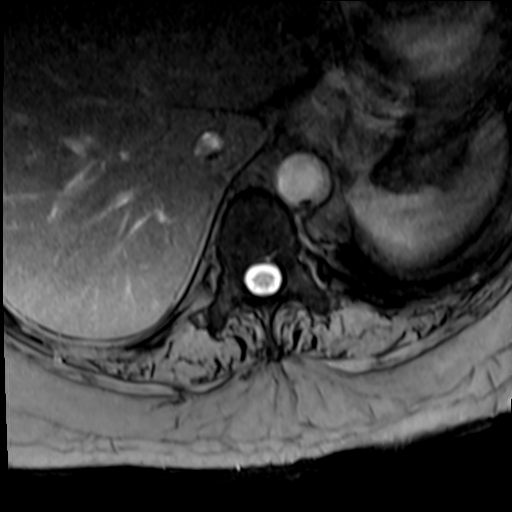

[31 of 48 positions shown; findings below may reference images not displayed]

FINDINGS: Evaluation is somewhat limited by motion artifact.

Alignment:  Physiologic.

Vertebrae: No fracture, evidence of discitis, or bone lesion.

Cord:  Normal signal and morphology.

Paraspinal and other soft tissues: Small right pleural effusion.

Disc levels:

T6-T7: Small central disc protrusion. No spinal canal stenosis or
neural foraminal narrowing.

T11-T12: Small left paracentral disc protrusion. No spinal canal
stenosis or neural foraminal narrowing.

Other thoracic vertebral levels are negative.
IMPRESSION: 1. No evidence of demyelinating disease in the thoracic spinal cord.
2. No spinal canal stenosis or neural foraminal narrowing.

## 2021-01-21 IMAGING — CT CT NECK W/O CM
4 of 5 series · 14 of 35 positions shown, 16 images · non-contrast
Comparison: CTA head neck [DATE]

CLINICAL DATA: Soft tissue swelling, infection suspected

EXAM:
CT NECK WITHOUT CONTRAST
TECHNIQUE: Multidetector CT imaging of the neck was performed following the
standard protocol without intravenous contrast.

[Series 2: axial neck · axial · 0.52mm/px · z∈[-616,-500]mm · 3 of 117 slices shown]
[im 30/117  bone]
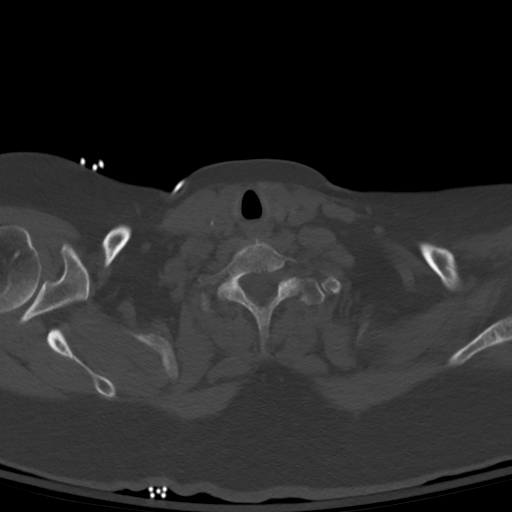
[im 59/117  bone]
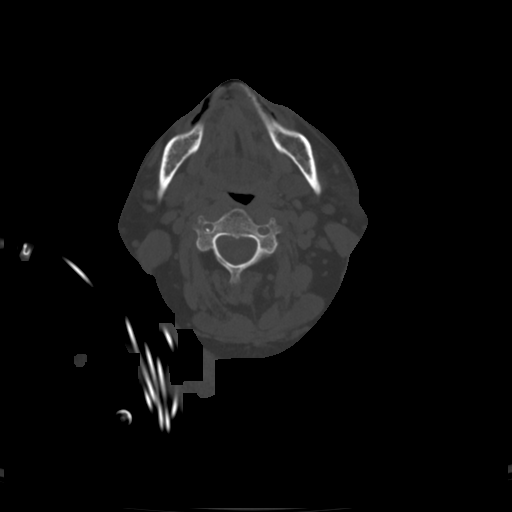
[im 88/117  bone]
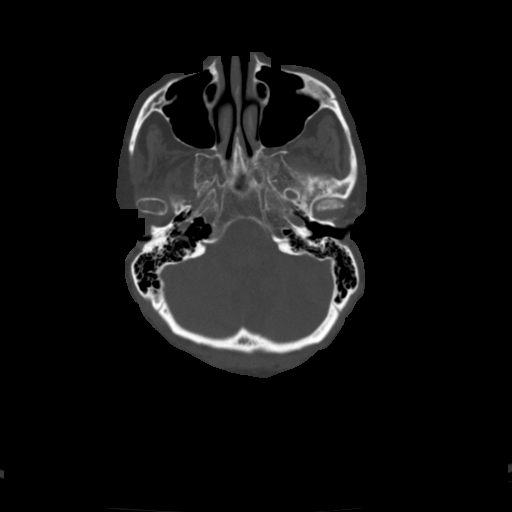

[Series 5: sag neck · sagittal · 0.49mm/px · 5 of 140 slices shown, 6 images]
[im 47/140  bone]
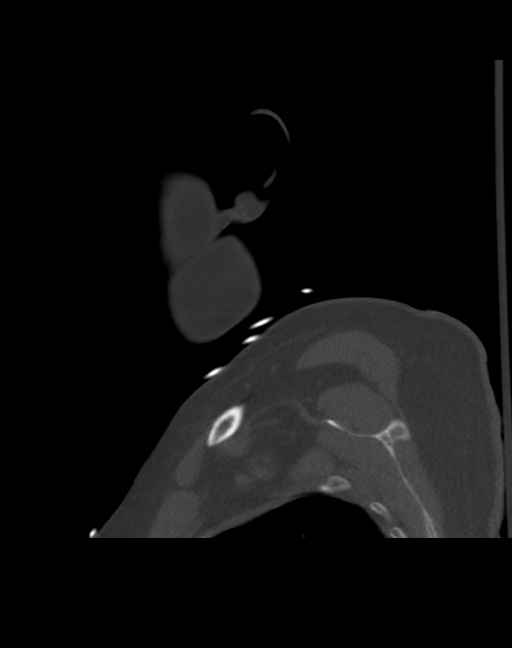
[im 58/140  bone]
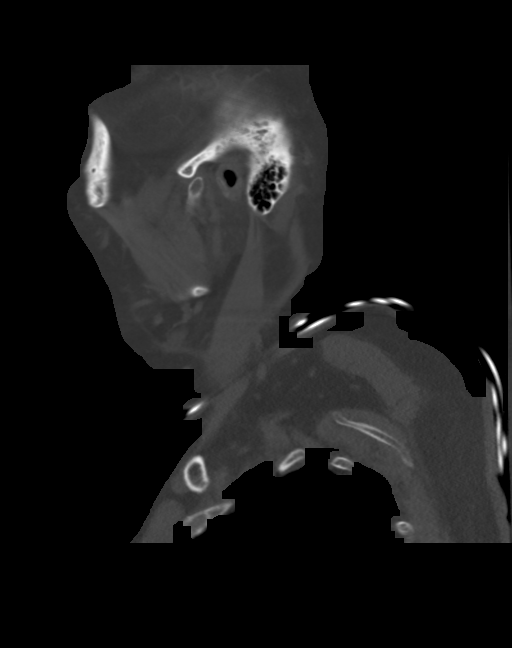
[im 70/140  soft-tissue]
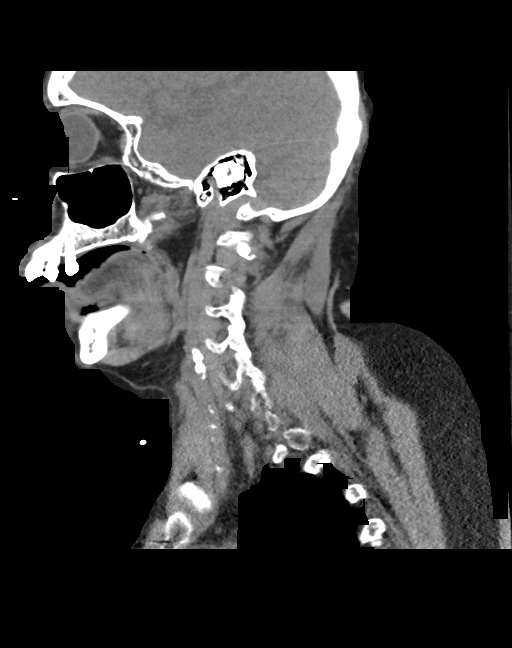
[im 70/140  bone]
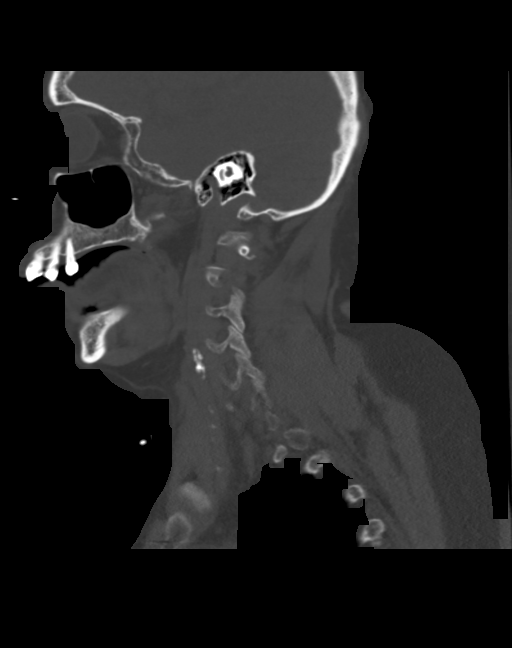
[im 82/140  bone]
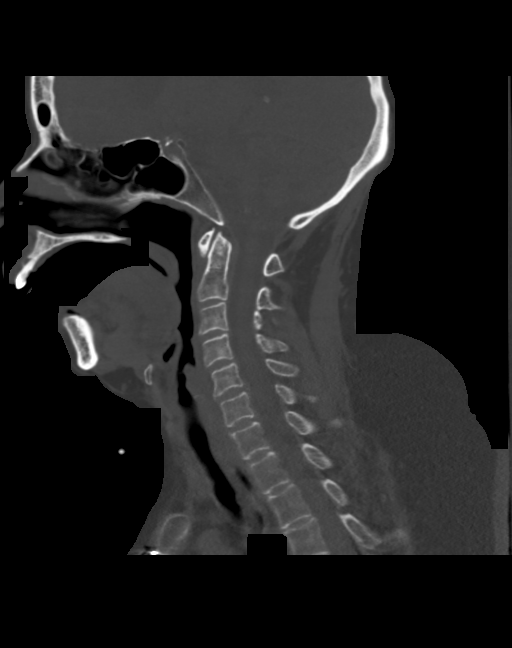
[im 93/140  bone]
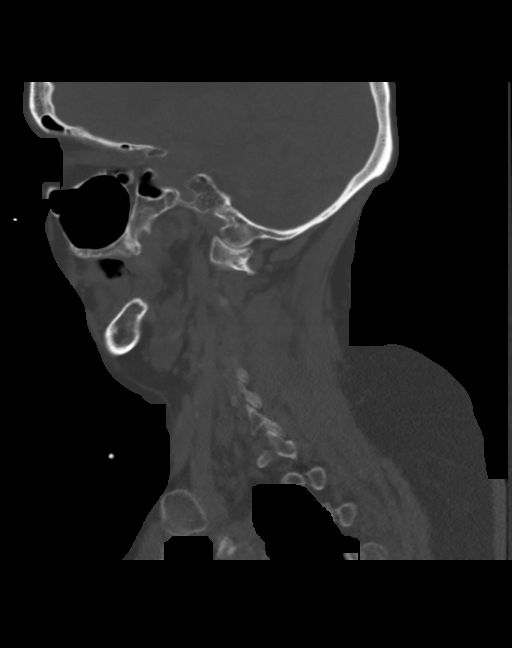

[Series 6: cor neck · coronal · 0.58mm/px · 3 of 127 slices shown]
[im 26/127  bone]
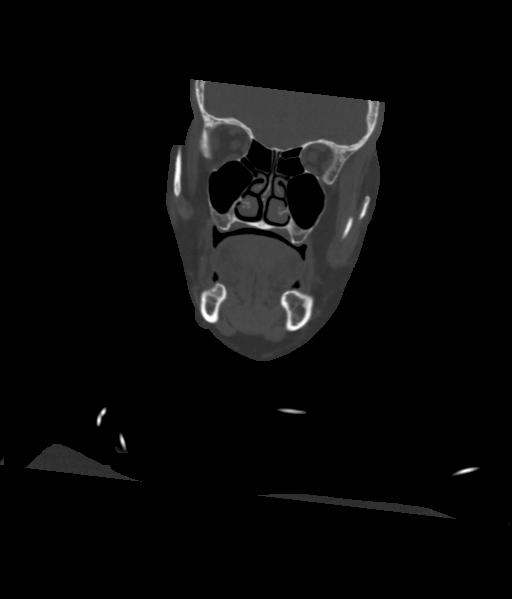
[im 51/127  bone]
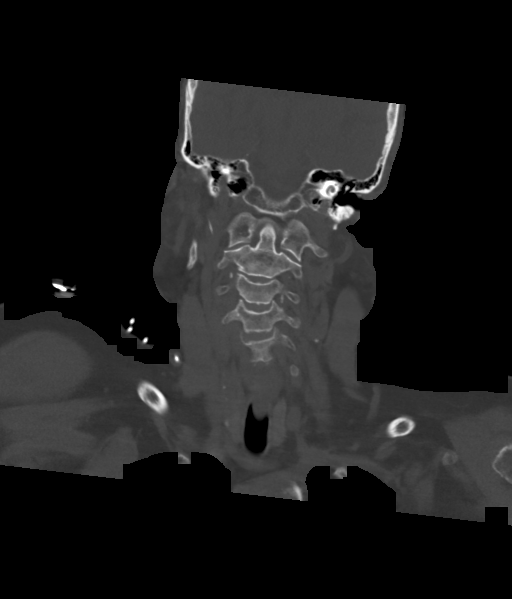
[im 76/127  bone]
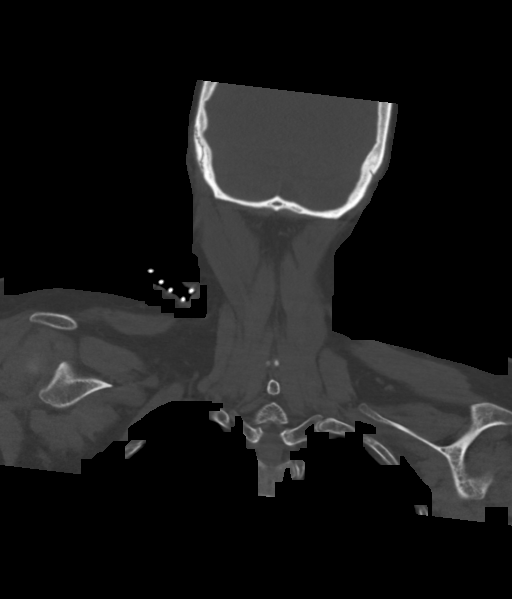

[Series 7: orthogonal (person_name) · axial · 0.52mm/px · z∈[-641,-510]mm · 3 of 134 slices shown, 4 images]
[im 34/134  soft-tissue]
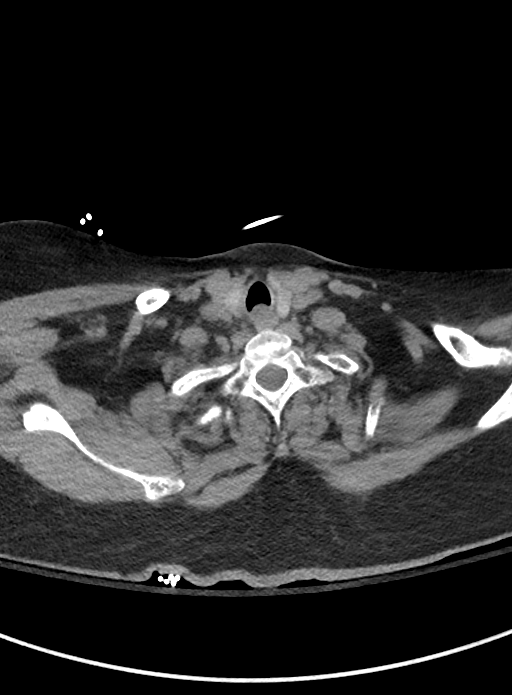
[im 34/134  bone]
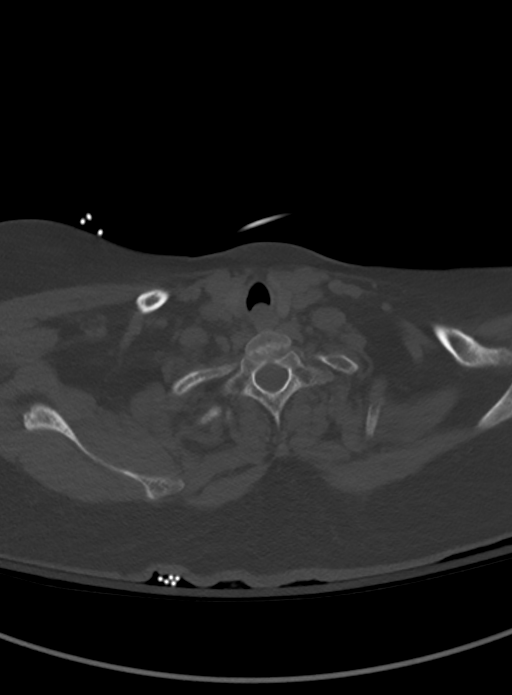
[im 67/134  bone]
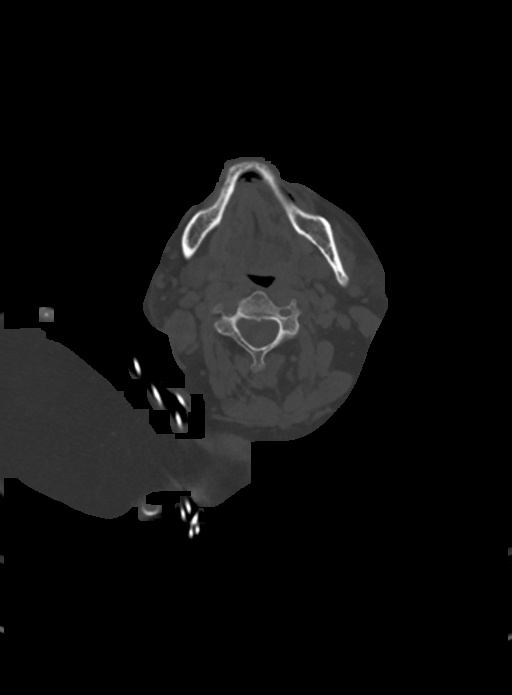
[im 100/134  bone]
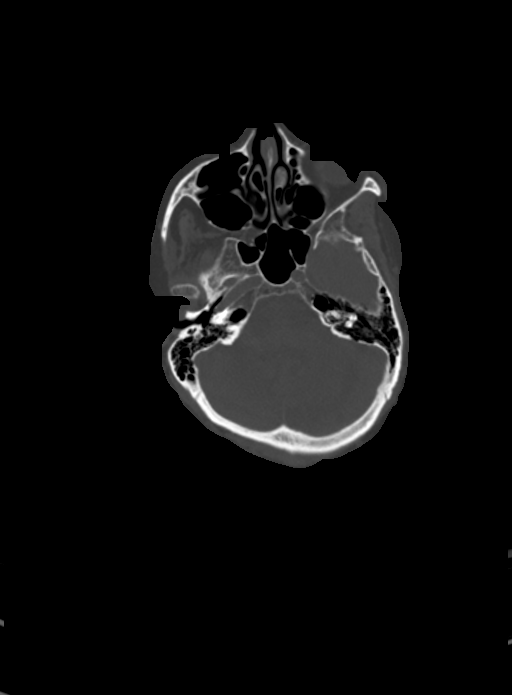

[14 of 35 positions shown; findings below may reference images not displayed]

FINDINGS: Pharynx and larynx: The nasal cavity and nasopharynx are normal. The
oropharynx and oral cavity are normal. The hypopharynx and larynx
are normal. There is no abnormal soft tissue mass or fluid
collection, within the confines of noncontrast technique. The
parapharyngeal spaces are clear. The airway is patent.

Salivary glands: Parotid and submandibular glands are normal.

Thyroid: Unremarkable.

Lymph nodes: There is no pathologic lymphadenopathy in the neck.

Vascular: There is calcified atherosclerotic plaque in the bilateral
carotid bulbs.

Limited intracranial: The imaged portions of the intracranial
compartment are unremarkable.

Visualized orbits: The globes and orbits are unremarkable.

Mastoids and visualized paranasal sinuses: Paranasal sinuses and
mastoid air cells are clear.

Skeleton: There is no significant degenerative change of the
cervical spine. There is no acute osseous abnormality or aggressive
osseous lesion. There is no visible canal hematoma

Upper chest: . the imaged lung apices are clear.

Other: None.
IMPRESSION: Unremarkable noncontrast CT of the neck with no acute finding to
explain the patient's symptoms.

## 2021-01-21 MED ORDER — METOPROLOL TARTRATE 50 MG PO TABS
100.0000 mg | ORAL_TABLET | Freq: Two times a day (BID) | ORAL | Status: DC
  Administered 2021-01-22 – 2021-02-02 (×22): 100 mg via ORAL
  Filled 2021-01-21 (×24): qty 2

## 2021-01-21 MED ORDER — METHYLPREDNISOLONE SODIUM SUCC 40 MG IJ SOLR
40.0000 mg | Freq: Two times a day (BID) | INTRAMUSCULAR | Status: DC
Start: 2021-01-21 — End: 2021-01-22
  Administered 2021-01-21: 40 mg via INTRAVENOUS
  Filled 2021-01-21: qty 1

## 2021-01-21 MED ORDER — SODIUM CHLORIDE 0.9 % IV SOLN
INTRAVENOUS | Status: DC
Start: 2021-01-21 — End: 2021-01-22

## 2021-01-21 MED ORDER — INSULIN GLARGINE-YFGN 100 UNIT/ML ~~LOC~~ SOLN
17.0000 [IU] | Freq: Two times a day (BID) | SUBCUTANEOUS | Status: DC
Start: 2021-01-21 — End: 2021-01-22
  Administered 2021-01-21 – 2021-01-22 (×2): 17 [IU] via SUBCUTANEOUS
  Filled 2021-01-21 (×3): qty 0.17

## 2021-01-21 MED ORDER — ASPIRIN 81 MG PO CHEW
81.0000 mg | CHEWABLE_TABLET | Freq: Every day | ORAL | Status: DC
  Administered 2021-01-21 – 2021-02-02 (×12): 81 mg via ORAL
  Filled 2021-01-21 (×13): qty 1

## 2021-01-21 MED ORDER — METOCLOPRAMIDE HCL 5 MG/ML IJ SOLN
10.0000 mg | Freq: Three times a day (TID) | INTRAMUSCULAR | Status: DC
  Administered 2021-01-21 – 2021-01-27 (×21): 10 mg via INTRAVENOUS
  Filled 2021-01-21 (×21): qty 2

## 2021-01-21 NOTE — Progress Notes (Signed)
Physical Therapy Treatment Patient Details Name: Lori [name] MRN: [id number] DOB: 1970-07-20 Today's Date: 01/21/2021   History of Present Illness Pt is a 50 y/o F admitted on 01/19/21 after presenting with c/c of aphasia, N&V. MRI shows no acute intracranial pathology.Marland Kitchen PMH: HTN, HLD, DM, stroke with L sided weakness, thyroid disease, depression with anxiety, CAD s/p CABG, pheochromocytoma (s/p R adrenal gland removal)    PT Comments    Pt seen for PT tx with pt received asleep in bed, requiring extra time & cuing to increase alertness. Pt requires max assist for supine<>sit with cuing to use hospital bed features but is able to scoot to [facility] with bed in trendelenburg position & use of bed rails. Pt does attempt sit>stand with +1 assist but is unable to entirely clear buttocks from EOB despite 2 attempts. Pt does engage in LE exercises & balance tasks while sitting EOB. Will continue to follow pt acutely & attempt to coordinate with OT to assist pt to recliner.    Recommendations for follow up therapy are one component of a multi-disciplinary discharge planning process, led by the attending physician.  Recommendations may be updated based on patient status, additional functional criteria and insurance authorization.  Follow Up Recommendations  Acute inpatient rehab (3hours/day)     Assistance Recommended at Discharge Frequent or constant Supervision/Assistance  Equipment Recommendations   (TBD in next venue)    Recommendations for Other Services       Precautions / Restrictions Precautions Precautions: Fall Precaution Comments: L hemi (UE>LE), L field cut Restrictions Weight Bearing Restrictions: No     Mobility  Bed Mobility Overal bed mobility: Needs Assistance Bed Mobility: Supine to Sit;Sit to Supine     Supine to sit: Max assist;HOB elevated Sit to supine: Max assist;HOB elevated   General bed mobility comments: Assistance to initiate moving to EOB, cuing to use  bed rails. Pt is able to scoot to [facility] without assistance with use of bed rails & bed in trendelenburg position.    Transfers Overall transfer level:  (Attempted sit>stand from elevated EOB with +1 assist with PT blocking L knee & providing assistance to power up but pt unable to clear entire buttocks from EOB.)                      Ambulation/Gait                   Stairs             Wheelchair Mobility    Modified Rankin (Stroke Patients Only)       Balance Overall balance assessment: Needs assistance Sitting-balance support: Feet supported;Single extremity supported Sitting balance-Leahy Scale: Fair Sitting balance - Comments: Pt engages in reaching with RUE to focus on improving balance. Pt reaches to edge of BOS, minimal reaching outside of BOS. Returns to midline. Requires min assist throughout.                                    Cognition Arousal/Alertness: Awake/alert Behavior During Therapy: WFL for tasks assessed/performed Overall Cognitive Status: Within Functional Limits for tasks assessed                                 General Comments: Pt received asleep in bed, requiring extra time to increase alertness but eventually does.  Requires encouragement to attempt to verablize words & speak at increased volume (doesn't speak as often as during yesterday's session & when she does she speaks at a much quieter volume). Functional Status Assessment: Patient has had a recent decline in their functional status and demonstrates the ability to make significant improvements in function in a reasonable and predictable amount of time.      Exercises General Exercises - Lower Extremity Long Arc Quad: AROM;Strengthening;Right;10 reps;Seated (PT assist with lifting L foot off of floor & pt able to perform 3 minimal LAQ LLE.)    General Comments        Pertinent Vitals/Pain Pain Assessment: Faces Faces Pain Scale: No hurt     Home Living                          Prior Function            PT Goals (current goals can now be found in the care plan section) Acute Rehab PT Goals Patient Stated Goal: increase independence with mobility PT Goal Formulation: With patient Time For Goal Achievement: 02/03/21 Potential to Achieve Goals: Fair Progress towards PT goals: Progressing toward goals    Frequency    7X/week      PT Plan Current plan remains appropriate    Co-evaluation              AM-PAC PT "6 Clicks" Mobility   Outcome Measure  Help needed turning from your back to your side while in a flat bed without using bedrails?: A Lot Help needed moving from lying on your back to sitting on the side of a flat bed without using bedrails?: Total Help needed moving to and from a bed to a chair (including a wheelchair)?: Total Help needed standing up from a chair using your arms (e.g., wheelchair or bedside chair)?: Total Help needed to walk in hospital room?: Total Help needed climbing 3-5 steps with a railing? : Total 6 Click Score: 7    End of Session Equipment Utilized During Treatment: Gait belt Activity Tolerance: Patient tolerated treatment well Patient left: in bed;with call bell/phone within reach;with bed alarm set   PT Visit Diagnosis: Unsteadiness on feet (R26.81);Muscle weakness (generalized) (M62.81);Difficulty in walking, not elsewhere classified (R26.2);Hemiplegia and hemiparesis Hemiplegia - Right/Left: Left Hemiplegia - dominant/non-dominant: Non-dominant Hemiplegia - caused by: Unspecified     Time: 4854-6270 PT Time Calculation (min) (ACUTE ONLY): 19 min  Charges:  $Neuromuscular Re-education: 8-22 mins                     Lavone Nian, PT, DPT 01/21/21, 3:26 PM    Waunita Schooner 01/21/2021, 3:23 PM

## 2021-01-21 NOTE — Progress Notes (Signed)
Patient is unable to perform NIF and FVC.

## 2021-01-21 NOTE — Progress Notes (Signed)
Subjective: Lip swelling improved today. Sensation of throat swelling has worsened.   Objective: Current vital signs: BP (!) 160/103    Pulse (!) 123    Temp 98.6 F (37 C) (Oral)    Resp (!) 22    Ht 5\' 2"  (1.575 m)    Wt 97.7 kg    SpO2 99%    BMI 39.40 kg/m  Vital signs in last 24 hours: Temp:  [98.5 F (36.9 C)-98.9 F (37.2 C)] 98.6 F (37 C) (12/18 1200) Pulse Rate:  [111-131] 123 (12/18 1300) Resp:  [16-41] 22 (12/18 1300) BP: (134-173)/(95-124) 160/103 (12/18 1300) SpO2:  [94 %-99 %] 99 % (12/18 1300)  Intake/Output from previous day: 12/17 0701 - 12/18 0700 In: 368 [P.O.:240; I.V.:27.1; IV Piggyback:100.9] Out: 900 [Urine:900] Intake/Output this shift: Total I/O In: 480 [P.O.:480] Out: 525 [Urine:525] Nutritional status:  Diet Order             Diet clear liquid Room service appropriate? Yes; Fluid consistency: Nectar Thick  Diet effective now                  HEENT-  [place]/AT. Lip swelling is improved from yesterday  Lungs- Respirations unlabored but there is a faint, hushed stridorous quality to some inspirations Extremities- Warm and well perfused   Neurological Examination Mental Status: Drowsy in the context of having been medicated with Benadryl. Able to speak with dysarthric, short phrases and one-word replies which is stable since yesterday; however, hypophonia is worse. Able to follow all commands. Good insight.  Cranial Nerves: II: Left homonymous superior quadrantanopsia. Positive for extinction on the left to DSS. Fixates normally.   III,IV, VI: No ptosis. Eyes are conjugate. No nystagmus. Can track a moving object to the right and left, but has some difficulty with leftward gaze. Saccadic quality of pursuits is noted.  VII: Mild right facial droop (lower quadrant).  VIII: Hearing intact to questions and commands IX,X: Hypophonic speech XI: Head is midlline XII: Midline tongue Motor: LUE: Flexion contractures at elbow, wrist and digits. Arm  adducted at shoulder. Increased tone. Can elevate forearm and elbow a few cm, but with difficulty. LLE: Increased tone. Falls to bed after passive elevation and release, with minimal effort against gravity. Spontaneous clonus to left foot intermittently. Sustained clonus with passive dorsiflexion by examiner.  RUE 4+/5  RLE 4+/5. Spontaneous clonus to right foot as well as with dorsiflexion-induced clonus.  Cerebellar: No ataxia disproportionate to weakness   Gait: Unable to assess.   Lab Results: Results for orders placed or performed during the hospital encounter of 01/19/21 (from the past 48 hour(s))  CBG monitoring, ED     Status: Abnormal   Collection Time: 01/19/21  5:39 PM  Result Value Ref Range   Glucose-Capillary 322 (H) 70 - 99 mg/dL    Comment: Glucose reference range applies only to samples taken after fasting for at least 8 hours.  CBG monitoring, ED     Status: Abnormal   Collection Time: 01/19/21  9:52 PM  Result Value Ref Range   Glucose-Capillary 314 (H) 70 - 99 mg/dL    Comment: Glucose reference range applies only to samples taken after fasting for at least 8 hours.  Lipid panel     Status: None   Collection Time: 01/20/21  7:09 AM  Result Value Ref Range   Cholesterol 165 0 - 200 mg/dL   Triglycerides 81 <150 mg/dL   HDL 54 >40 mg/dL   Total CHOL/HDL Ratio 3.1  RATIO   VLDL 16 0 - 40 mg/dL   LDL Cholesterol 95 0 - 99 mg/dL    Comment:        Total Cholesterol/HDL:CHD Risk Coronary Heart Disease Risk Table                     Men   Women  1/2 Average Risk   3.4   3.3  Average Risk       5.0   4.4  2 X Average Risk   9.6   7.1  3 X Average Risk  23.4   11.0        Use the calculated Patient Ratio above and the CHD Risk Table to determine the patient's CHD Risk.        ATP III CLASSIFICATION (LDL):  <100     mg/dL   Optimal  100-129  mg/dL   Near or Above                    Optimal  130-159  mg/dL   Borderline  160-189  mg/dL   High  >190     mg/dL    Very High Performed at [facility], [place], [place] [address]   HIV Antibody (routine testing w rflx)     Status: None   Collection Time: 01/20/21  7:09 AM  Result Value Ref Range   HIV Screen 4th Generation wRfx Non Reactive Non Reactive    Comment: Performed at [facility], [place] [address] [place], [place] [address]  T4, free     Status: Abnormal   Collection Time: 01/20/21  7:09 AM  Result Value Ref Range   Free T4 1.16 (H) 0.61 - 1.12 ng/dL    Comment: (NOTE) Biotin ingestion may interfere with free T4 tests. If the results are inconsistent with the TSH level, previous test results, or the clinical presentation, then consider biotin interference. If needed, order repeat testing after stopping biotin. Performed at [facility], [address] [place], [place] [address]   Lipase, blood     Status: Abnormal   Collection Time: 01/20/21  7:09 AM  Result Value Ref Range   Lipase 110 (H) 11 - 51 U/L    Comment: Performed at [facility], [address] [place], [place] [address]  Glucose, capillary     Status: Abnormal   Collection Time: 01/20/21  7:51 AM  Result Value Ref Range   Glucose-Capillary 328 (H) 70 - 99 mg/dL    Comment: Glucose reference range applies only to samples taken after fasting for at least 8 hours.  Glucose, capillary     Status: Abnormal   Collection Time: 01/20/21 11:05 AM  Result Value Ref Range   Glucose-Capillary 367 (H) 70 - 99 mg/dL    Comment: Glucose reference range applies only to samples taken after fasting for at least 8 hours.  Glucose, capillary     Status: Abnormal   Collection Time: 01/20/21  4:10 PM  Result Value Ref Range   Glucose-Capillary 329 (H) 70 - 99 mg/dL    Comment: Glucose reference range applies only to samples taken after fasting for at least 8 hours.  Glucose, capillary     Status: Abnormal   Collection Time: 01/20/21  9:36 PM  Result Value Ref Range    Glucose-Capillary 252 (H) 70 - 99 mg/dL    Comment: Glucose reference range applies only to samples taken after fasting  for at least 8 hours.  Glucose, capillary     Status: Abnormal   Collection Time: 01/21/21  7:37 AM  Result Value Ref Range   Glucose-Capillary 322 (H) 70 - 99 mg/dL    Comment: Glucose reference range applies only to samples taken after fasting for at least 8 hours.  Glucose, capillary     Status: Abnormal   Collection Time: 01/21/21 11:50 AM  Result Value Ref Range   Glucose-Capillary 323 (H) 70 - 99 mg/dL    Comment: Glucose reference range applies only to samples taken after fasting for at least 8 hours.    Recent Results (from the past 240 hour(s))  Blood Culture (routine x 2)     Status: None (Preliminary result)   Collection Time: 01/19/21  8:50 AM   Specimen: BLOOD  Result Value Ref Range Status   Specimen Description BLOOD BLOOD LEFT FOREARM  Final   Special Requests   Final    BOTTLES DRAWN AEROBIC AND ANAEROBIC Blood Culture results may not be optimal due to an inadequate volume of blood received in culture bottles   Culture   Final    NO GROWTH 2 DAYS Performed at [facility], [address] [place], [place] [address]    Report Status PENDING  Incomplete  Resp Panel by RT-PCR (Flu A&B, Covid) Nasopharyngeal Swab     Status: None   Collection Time: 01/19/21  9:44 AM   Specimen: Nasopharyngeal Swab; Nasopharyngeal(NP) swabs in vial transport medium  Result Value Ref Range Status   SARS Coronavirus 2 by RT PCR NEGATIVE NEGATIVE Final    Comment: (NOTE) SARS-CoV-2 target nucleic acids are NOT DETECTED.  The SARS-CoV-2 RNA is generally detectable in upper respiratory specimens during the acute phase of infection. The lowest concentration of SARS-CoV-2 viral copies this assay can detect is 138 copies/mL. A negative result does not preclude SARS-Cov-2 infection and should not be used as the sole basis for treatment or other patient management  decisions. A negative result may occur with  improper specimen collection/handling, submission of specimen other than nasopharyngeal swab, presence of viral mutation(s) within the areas targeted by this assay, and inadequate number of viral copies(<138 copies/mL). A negative result must be combined with clinical observations, patient history, and epidemiological information. The expected result is Negative.  Fact Sheet for Patients:  EntrepreneurPulse.com.au  Fact Sheet for Healthcare Providers:  IncredibleEmployment.be  This test is no t yet approved or cleared by the [place] FDA and  has been authorized for detection and/or diagnosis of SARS-CoV-2 by FDA under an Emergency Use Authorization (EUA). This EUA will remain  in effect (meaning this test can be used) for the duration of the COVID-19 declaration under Section 564(b)(1) of the Act, 21 U.S.C.section 360bbb-3(b)(1), unless the authorization is terminated  or revoked sooner.       Influenza A by PCR NEGATIVE NEGATIVE Final   Influenza B by PCR NEGATIVE NEGATIVE Final    Comment: (NOTE) The Xpert Xpress SARS-CoV-2/FLU/RSV plus assay is intended as an aid in the diagnosis of influenza from Nasopharyngeal swab specimens and should not be used as a sole basis for treatment. Nasal washings and aspirates are unacceptable for Xpert Xpress SARS-CoV-2/FLU/RSV testing.  Fact Sheet for Patients: EntrepreneurPulse.com.au  Fact Sheet for Healthcare Providers: IncredibleEmployment.be  This test is not yet approved or cleared by the [place] FDA and has been authorized for detection and/or diagnosis of SARS-CoV-2 by FDA under an Emergency Use Authorization (EUA). This EUA will  remain in effect (meaning this test can be used) for the duration of the COVID-19 declaration under Section 564(b)(1) of the Act, 21 U.S.C. section 360bbb-3(b)(1), unless the  authorization is terminated or revoked.  Performed at [facility], [address] [place], [place] [address]   Culture, blood (Routine X 2) w Reflex to ID Panel     Status: None (Preliminary result)   Collection Time: 01/19/21 10:02 AM   Specimen: BLOOD  Result Value Ref Range Status   Specimen Description BLOOD RIGHT FOA  Final   Special Requests   Final    BOTTLES DRAWN AEROBIC AND ANAEROBIC Blood Culture adequate volume   Culture   Final    NO GROWTH 2 DAYS Performed at [facility], [address] [place], [place] [address]    Report Status PENDING  Incomplete    Lipid Panel Recent Labs    01/20/21 0709  CHOL 165  TRIG 81  HDL 54  CHOLHDL 3.1  VLDL 16  LDLCALC 95    Studies/Results: MR BRAIN WO CONTRAST  Result Date: 01/20/2021 CLINICAL DATA:  Left-sided weakness, stroke suspected EXAM: MRI HEAD WITHOUT CONTRAST TECHNIQUE: Multiplanar, multiecho pulse sequences of the brain and surrounding structures were obtained without intravenous contrast. COMPARISON:  CT/CTA head and neck dated 1 day prior FINDINGS: Brain: There is no evidence of acute intracranial hemorrhage, extra-axial fluid collection, or acute infarct. There is a background of moderate parenchymal volume loss, advanced for age. There are remote infarcts in the bilateral basal ganglia, thalami, right cerebral peduncle, and periventricular white matter. There is a remote infarct in the left cerebellar hemisphere. Additional confluent FLAIR signal abnormality in the subcortical and periventricular white matter likely reflects sequela of chronic white matter microangiopathy, also advanced for age. There are multiple small chronic microhemorrhages in a central distribution, likely hypertensive in etiology. There is no solid mass lesion.  There is no midline shift. Vascular: Normal flow voids. Skull and upper cervical spine: Normal marrow signal. Sinuses/Orbits: The paranasal sinuses are clear. The  globes and orbits are unremarkable. Other: None. IMPRESSION: 1. No acute intracranial pathology. 2. Moderate parenchymal volume loss and chronic white matter microangiopathy, advanced for age. 3. 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 4. Multiple small chronic microhemorrhages in a central distribution, likely hypertensive in etiology. Electronically Signed   By: Valetta Mole M.D.   On: 01/20/2021 14:31    Medications: Scheduled:   stroke: mapping our early stages of recovery book   Does not apply Once   amLODipine  10 mg Oral Daily   aspirin  81 mg Oral Daily   atorvastatin  80 mg Oral Daily   chlorhexidine  15 mL Mouth Rinse BID   Chlorhexidine Gluconate Cloth  6 each Topical Daily   cyclobenzaprine  5 mg Oral TID   diphenhydrAMINE  25 mg Intravenous Q8H   docusate sodium  200 mg Oral QHS   escitalopram  20 mg Oral Daily   gabapentin  200 mg Oral TID   heparin  5,000 Units Subcutaneous Q8H   insulin aspart  0-5 Units Subcutaneous QHS   insulin aspart  0-9 Units Subcutaneous TID WC   insulin glargine-yfgn  17 Units Subcutaneous BID   lisinopril  40 mg Oral Daily   loratadine  10 mg Oral Daily   LORazepam  0.25 mg Oral QHS   mouth rinse  15 mL Mouth Rinse q12n4p   melatonin  10 mg  Oral QHS   methylPREDNISolone (SOLU-MEDROL) injection  40 mg Intravenous Q12H   metoCLOPramide (REGLAN) injection  10 mg Intravenous Q8H   metoprolol tartrate  100 mg Oral BID   pantoprazole (PROTONIX) IV  40 mg Intravenous Q12H   sodium chloride flush  3 mL Intravenous Once   ticagrelor  90 mg Oral BID   Continuous:  famotidine (PEPCID) IV 20 mg (01/21/21 1100)   Assessment: 50 year old female presenting with acute onset of mutism and BLE weakness. Also with intermittent vomiting. Overall presentation most consistent with a small brainstem or thalamic stroke versus a left perisylvian stroke.  1. Exams: - Exam today: No  significant change from yesterday.  -Homonymous left superior quadrantanopsia and visual extinction on the left; of note, there is no MRI lesion that clearly corresponds to these deficits.  -Also with continued asymmetric BLE weakness with clonus; family states the clonus has been ongoing for a year; there is no clear MRI finding to explain this, other than possibly the multifocal old infarctions. Will need thoracic spine imaging to further evaluate.  - Exam yesterday (Saturday) revealed findings referable to the old right basal ganglia lacunar infarct seen on CT head. Also noted is expressive aphasia which has improved from mutism noted on admission, to one word answers and 1-3 word short phrases. Speech dysarthric.     2. MRI brain performed yesterday (Saturday) shows no acute intracranial pathology. There is moderate parenchymal volume loss and chronic white matter microangiopathy, advanced for age. 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 Multiple small chronic microhemorrhages in a central distribution, likely hypertensive in etiology. 3. CTA of head and neck: Generalized and severe atherosclerosis, especially for age. Most severe abnormalities are in the posterior circulation where there are left vertebral and basilar occlusions which are age indeterminate, as well as severe right V4 segment stenosis. No flow limiting stenosis in the cervical carotid circulation. Probable flow limiting stenosis at the carotid siphons, especially on the right. 4. New lip swelling. Patient states her throat feels swollen as well. Felt most likely to be due to her known iodine contrast allergy. Another possibility would be an allergy to newly started ticlopidine, since it can cross react in patient's with an allergy to Plavix. Patient states that she has an allergy to ASA, but that it was mild; she cannot remember the specific manifestations of  the allergy. She is currently on Solumedrol.  5. Subjective sensation of throat swelling in conjunction with worsening dysphagia. Seems to have occurred in concert with her new onset lip swelling. DDx for this is similar to that in #4, including reaction from the iodinated contrast she received from prior CT and reaction to ticlopidine (may cross react in patient's with allergy to Plavix). DDx included interval new stroke during admission, but this was ruled out by MRI obtained yesterday (Saturday). Imaging of the neck has been ordered by Hospitalist team. May need repeat brain MRI    Recommendations: 1. BP management. Averaging about 643-329 systolic. Goal given her severe stenoses on CTA should be a SBP of 130-150 for now.   2. TTE is complete with report pending  3. MRI of thoracic spine (ordered) 4. Cardiac telemetry 5. Frequent neuro checks 6. PT/OT/Speech 7. 80 mg atorvastatin   8. ASA 81 mg po qd. Has an allergy to clopidogrel. 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    9. IVF 10. Monitor her lip swelling closely. Also monitor breathing,  breath sounds especially in the neck region and continuous pulse ox. Respiratory therapy for testing of NIF and FVC q12h.  CT of neck is pending.      LOS: 2 days   @Electronically  signed: Dr. Kerney Elbe 01/21/2021  2:07 PM

## 2021-01-21 NOTE — Progress Notes (Signed)
Inpatient Rehab Admissions Coordinator:  Due to pt's speech deficits, called pt's daughter [name]. Left a message; awaiting return call. Will continue to follow.   Gayland Curry, [place], [place] Admissions Coordinator [phone]

## 2021-01-21 NOTE — Progress Notes (Signed)
PROGRESS NOTE    Lori [name]  VQQ:[id number] DOB: 04-05-1970 DOA: 01/19/2021 PCP: Pcp, No    Brief Narrative:  50 y.o. female with medical history significant of hypertension, hyperlipidemia, diabetes mellitus, stroke with left-sided weakness, thyroid disease (patient does not know which type of thyroid disease), depression with anxiety, CAD, s/p of CABG, pheochromocytoma (s/p of right adrenal gland removal per patient), who presents with aphasia, nausea and vomiting.   Patient has history of stroke with left-sided weakness.  At her normal baseline, patient is able to perform ADLs, typically independently with assistance of a walker.  Patient was last known normal at 10 PM last night 01/18/21. Today she started having difficult speaking at about 6 AM.  She also reports bilateral leg weakness, cannot walk due to weakness.  Patient denies chest pain, cough, shortness of breath.  No fever or chills.  No symptoms of UTI.  She states that she has nausea, and has vomited at least 8 times with bilious and greenish colored vomitus.  Denies diarrhea or abdominal pain.   Patient was found to have sinus tachycardia with heart rate up to 160s, patient was given IV metoprolol in the ED.  Heart rate improved to 120s.  Heart rate has improved however patient remains tachycardic and hypertensive.  She is mentating clearly but does have significant soft tissue neck swelling.  Allergic reaction but unknown trigger.  Patient attributes it to known iodine contrast allergy.  Maintaining airway with clear lungs.   Assessment & Plan:   Principal Problem:   Stroke [facility]) Active Problems:   Sinus tachycardia   Diabetes mellitus without complication (HCC)   Thyroid disease   Hypertension   CAD (coronary artery disease)   Pheochromocytoma   Depression   HLD (hyperlipidemia)   Nausea & vomiting   CVA (cerebral vascular accident) ([place])  Aphasia Bilateral lower extremity weakness Intermittent  vomiting Concern for brainstem versus thalamic CVA CT negative Neurology following Aphasia waxing and waning Speech remains dysarthric MRI negative for acute infarct Plan: Blood pressure management, normotensive blood pressure goals MRI brain currently pending TTE, pending Telemetry Frequent neurochecks Check A1c, pending fasting lipids, unremarkable Therapy and speech evaluations Statin Aspirin 81 daily Ticagrelor Intravenous fluids   New lip swelling Soft tissue neck swelling Presentation concerning for allergic reaction Possibly attributed to iodine allergy Also possible ticlopidine Patient maintaining airway currently Plan: CT soft tissue neck without contrast Benadryl Solu-Medrol H2 blocker Careful monitoring respiratory status     Sinus tachycardia Patient heart rate was up to 160s.   Etiology is not clear.   Patient has history of thyroid disease, but she does not know which type of thyroid disease.  Patient is not taking medications.   TSH is 0.257, therefore may have contributed partially.   Patient has history of pheochromocytoma, but the patient is s/p of adrenal gland removal, less likely to play a role here. Plan: -Increase metoprolol 100 twice daily -As needed IV metoprolol   Thyroid disease  TSH 0.257 Free T4 1.16, mild elevation   Diabetes mellitus without complication (HCC)  Patient is taking Levemir 14 units twice daily at home Plan: -SSI -Increase Semglee to 16 units twice daily -Follow-up A1c, pending   Hypertension -- Blood pressure medications were held for 24 hours after admission Plan: Restart amlodipine 10 mg daily Restart lisinopril 40 mg daily As needed IV hydralazine  metoprolol 100 twice daily  titrate blood pressure regimen as necessary   CAD (coronary artery disease)  S/p of CABG -  Aspirin, Lipitor, Brilinta   Pheochromocytoma  S/p of right adrenal gland removal surgery -No acute issues   Depression -Continue  home medications   HLD (hyperlipidemia) -Lipitor   Nausea & vomiting  Etiology is not clear.   Patient does not have abdominal pain.   CT scan did not show acute intra-abdominal issues. Plan: IV fluids Liquid diet As needed antiemetics    CT-abd/pelvis: Patient appears to be status post hysterectomy. 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  Aphasia   DVT prophylaxis: SQ heparin Code Status: Full Family Communication: daughter [name] [phone] on 12/17 Disposition Plan: Status is: Inpatient  Remains inpatient appropriate because: No aphasia.  Possible acute versus subacute CVA.  MRI negative patient's neurologic symptoms appear to be evolving.      Level of care: Telemetry Medical  Consultants:  Neurology  Procedures:  None  Antimicrobials: None   Subjective: Seen and examined.  More aphasic today.  Speech dysarthric  Objective: Vitals:   01/21/21 0630 01/21/21 0700 01/21/21 0800 01/21/21 0900  BP: (!) 145/101 (!) 134/95 (!) 150/102 (!) 144/102  Pulse: (!) 131 (!) 127 (!) 118 (!) 115  Resp: 19 20 (!) 24 (!) 27  Temp:   98.7 F (37.1 C)   TempSrc:   Oral   SpO2: 98% 99% 98% 98%  Weight:      Height:        Intake/Output Summary (Last 24 hours) at 01/21/2021 1323 Last data filed at 01/21/2021 1257 Gross per 24 hour  Intake 530 ml  Output 1425 ml  Net -895 ml   Filed Weights   01/19/21 0838 01/20/21 0049  Weight: 102.6 kg 97.7 kg    Examination:  General exam: Mild distress due to retching Respiratory system: Lungs clear.  Normal work of breathing.  Room air Cardiovascular system: Tachycardic, regular rhythm, no murmurs, no pedal edema Gastrointestinal system: Soft, NT/ND, normal bowel sounds Central nervous system: Alert, oriented x3.  Dysarthric speech Extremities: BLE weakness Skin: Lip swelling and soft tissue neck swollen Psychiatry: Judgement and insight appear normal. Mood &  affect appropriate.     Data Reviewed: I have personally reviewed following labs and imaging studies  CBC: Recent Labs  Lab 01/19/21 0743  WBC 8.0  NEUTROABS 5.9  HGB 13.0  HCT 39.2  MCV 79.8*  PLT 053   Basic Metabolic Panel: Recent Labs  Lab 01/19/21 0743 01/19/21 0807  NA 132*  --   K 3.9  --   CL 96*  --   CO2 24  --   GLUCOSE 336*  --   BUN 12  --   CREATININE 0.70 0.60  CALCIUM 9.1  --    GFR: Estimated Creatinine Clearance: 93.8 mL/min (by C-G formula based on SCr of 0.6 mg/dL). Liver Function Tests: Recent Labs  Lab 01/19/21 0743  AST 56*  ALT 61*  ALKPHOS 90  BILITOT 0.9  PROT 8.0  ALBUMIN 3.7   Recent Labs  Lab 01/20/21 0709  LIPASE 110*   No results for input(s): AMMONIA in the last 168 hours. Coagulation Profile: Recent Labs  Lab 01/19/21 0743  INR 1.0   Cardiac Enzymes: No results for input(s): CKTOTAL, CKMB, CKMBINDEX, TROPONINI in the last 168 hours. BNP (last 3 results) No results for input(s): PROBNP in the last 8760 hours. HbA1C: No results for input(s): HGBA1C in the last 72 hours. CBG: Recent Labs  Lab 01/20/21 1105 01/20/21 1610 01/20/21 2136 01/21/21 0737 01/21/21 1150  GLUCAP 367* 329* 252* 322* 323*   Lipid Profile: Recent Labs    01/20/21 0709  CHOL 165  HDL 54  LDLCALC 95  TRIG 81  CHOLHDL 3.1   Thyroid Function Tests: Recent Labs    01/19/21 0743 01/20/21 0709  TSH 0.257*  --   FREET4  --  1.16*   Anemia Panel: No results for input(s): VITAMINB12, FOLATE, FERRITIN, TIBC, IRON, RETICCTPCT in the last 72 hours. Sepsis Labs: Recent Labs  Lab 01/19/21 0850 01/19/21 1002  LATICACIDVEN 2.0* 1.4    Recent Results (from the past 240 hour(s))  Blood Culture (routine x 2)     Status: None (Preliminary result)   Collection Time: 01/19/21  8:50 AM   Specimen: BLOOD  Result Value Ref Range Status   Specimen Description BLOOD BLOOD LEFT FOREARM  Final   Special Requests   Final    BOTTLES DRAWN  AEROBIC AND ANAEROBIC Blood Culture results may not be optimal due to an inadequate volume of blood received in culture bottles   Culture   Final    NO GROWTH 2 DAYS Performed at [facility], [address], [place], [place] [address]    Report Status PENDING  Incomplete  Resp Panel by RT-PCR (Flu A&B, Covid) Nasopharyngeal Swab     Status: None   Collection Time: 01/19/21  9:44 AM   Specimen: Nasopharyngeal Swab; Nasopharyngeal(NP) swabs in vial transport medium  Result Value Ref Range Status   SARS Coronavirus 2 by RT PCR NEGATIVE NEGATIVE Final    Comment: (NOTE) SARS-CoV-2 target nucleic acids are NOT DETECTED.  The SARS-CoV-2 RNA is generally detectable in upper respiratory specimens during the acute phase of infection. The lowest concentration of SARS-CoV-2 viral copies this assay can detect is 138 copies/mL. A negative result does not preclude SARS-Cov-2 infection and should not be used as the sole basis for treatment or other patient management decisions. A negative result may occur with  improper specimen collection/handling, submission of specimen other than nasopharyngeal swab, presence of viral mutation(s) within the areas targeted by this assay, and inadequate number of viral copies(<138 copies/mL). A negative result must be combined with clinical observations, patient history, and epidemiological information. The expected result is Negative.  Fact Sheet for Patients:  EntrepreneurPulse.com.au  Fact Sheet for Healthcare Providers:  IncredibleEmployment.be  This test is no t yet approved or cleared by the [place] FDA and  has been authorized for detection and/or diagnosis of SARS-CoV-2 by FDA under an Emergency Use Authorization (EUA). This EUA will remain  in effect (meaning this test can be used) for the duration of the COVID-19 declaration under Section 564(b)(1) of the Act, 21 U.S.C.section 360bbb-3(b)(1),  unless the authorization is terminated  or revoked sooner.       Influenza A by PCR NEGATIVE NEGATIVE Final   Influenza B by PCR NEGATIVE NEGATIVE Final    Comment: (NOTE) The Xpert Xpress SARS-CoV-2/FLU/RSV plus assay is intended as an aid in the diagnosis of influenza from Nasopharyngeal swab specimens and should not be used as a sole basis for treatment. Nasal washings and aspirates are unacceptable for Xpert Xpress SARS-CoV-2/FLU/RSV testing.  Fact Sheet for Patients: EntrepreneurPulse.com.au  Fact Sheet for Healthcare Providers: IncredibleEmployment.be  This test is not yet approved or cleared by the [place] FDA and has been authorized for detection and/or diagnosis of SARS-CoV-2 by FDA under an Emergency Use Authorization (EUA). This EUA will remain in effect (meaning this test can be used) for the duration  of the COVID-19 declaration under Section 564(b)(1) of the Act, 21 U.S.C. section 360bbb-3(b)(1), unless the authorization is terminated or revoked.  Performed at [facility], [address] [place], [place] [address]   Culture, blood (Routine X 2) w Reflex to ID Panel     Status: None (Preliminary result)   Collection Time: 01/19/21 10:02 AM   Specimen: BLOOD  Result Value Ref Range Status   Specimen Description BLOOD RIGHT FOA  Final   Special Requests   Final    BOTTLES DRAWN AEROBIC AND ANAEROBIC Blood Culture adequate volume   Culture   Final    NO GROWTH 2 DAYS Performed at [facility], [address], [place], [place] [address]    Report Status PENDING  Incomplete         Radiology Studies: MR BRAIN WO CONTRAST  Result Date: 01/20/2021 CLINICAL DATA:  Left-sided weakness, stroke suspected EXAM: MRI HEAD WITHOUT CONTRAST TECHNIQUE: Multiplanar, multiecho pulse sequences of the brain and surrounding structures were obtained without intravenous contrast. COMPARISON:  CT/CTA head and neck  dated 1 day prior FINDINGS: Brain: There is no evidence of acute intracranial hemorrhage, extra-axial fluid collection, or acute infarct. There is a background of moderate parenchymal volume loss, advanced for age. There are remote infarcts in the bilateral basal ganglia, thalami, right cerebral peduncle, and periventricular white matter. There is a remote infarct in the left cerebellar hemisphere. Additional confluent FLAIR signal abnormality in the subcortical and periventricular white matter likely reflects sequela of chronic white matter microangiopathy, also advanced for age. There are multiple small chronic microhemorrhages in a central distribution, likely hypertensive in etiology. There is no solid mass lesion.  There is no midline shift. Vascular: Normal flow voids. Skull and upper cervical spine: Normal marrow signal. Sinuses/Orbits: The paranasal sinuses are clear. The globes and orbits are unremarkable. Other: None. IMPRESSION: 1. No acute intracranial pathology. 2. Moderate parenchymal volume loss and chronic white matter microangiopathy, advanced for age. 3. 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 4. Multiple small chronic microhemorrhages in a central distribution, likely hypertensive in etiology. Electronically Signed   By: Valetta Mole M.D.   On: 01/20/2021 14:31        Scheduled Meds:   stroke: mapping our early stages of recovery book   Does not apply Once   amLODipine  10 mg Oral Daily   aspirin  81 mg Oral Daily   atorvastatin  80 mg Oral Daily   chlorhexidine  15 mL Mouth Rinse BID   Chlorhexidine Gluconate Cloth  6 each Topical Daily   cyclobenzaprine  5 mg Oral TID   diphenhydrAMINE  25 mg Intravenous Q8H   docusate sodium  200 mg Oral QHS   escitalopram  20 mg Oral Daily   gabapentin  200 mg Oral TID   heparin  5,000 Units Subcutaneous Q8H   insulin aspart  0-5 Units Subcutaneous QHS   insulin  aspart  0-9 Units Subcutaneous TID WC   insulin glargine-yfgn  17 Units Subcutaneous BID   lisinopril  40 mg Oral Daily   loratadine  10 mg Oral Daily   LORazepam  0.25 mg Oral QHS   mouth rinse  15 mL Mouth Rinse q12n4p   melatonin  10 mg Oral QHS   methylPREDNISolone (SOLU-MEDROL) injection  40 mg Intravenous Q12H   metoCLOPramide (REGLAN) injection  10 mg Intravenous Q8H   metoprolol tartrate  100 mg Oral BID  pantoprazole (PROTONIX) IV  40 mg Intravenous Q12H   sodium chloride flush  3 mL Intravenous Once   ticagrelor  90 mg Oral BID   Continuous Infusions:  famotidine (PEPCID) IV 20 mg (01/21/21 1100)     LOS: 2 days    Time spent: 35 minutes    Sidney Ace, MD [facility]   If 7PM-7AM, please contact night-coverage  01/21/2021, 1:23 PM

## 2021-01-21 NOTE — Plan of Care (Signed)
  Problem: Coping: Goal: Level of anxiety will decrease Outcome: Progressing   Problem: Safety: Goal: Ability to remain free from injury will improve Outcome: Progressing   Problem: Clinical Measurements: Goal: Ability to maintain clinical measurements within normal limits will improve Outcome: Progressing   

## 2021-01-21 NOTE — Progress Notes (Signed)
*  PRELIMINARY RESULTS* Echocardiogram 2D Echocardiogram has been performed.  Lori [name] 01/21/2021, 6:40 PM

## 2021-01-21 NOTE — Evaluation (Addendum)
Clinical/Bedside Swallow Evaluation Patient Details  Name: Lori [name] MRN: [id number] Date of Birth: 07/18/1970  Today's Date: 01/21/2021 Time: SLP Start Time (ACUTE ONLY): 1200 SLP Stop Time (ACUTE ONLY): 62 SLP Time Calculation (min) (ACUTE ONLY): 30 min  Past Medical History:  Past Medical History:  Diagnosis Date   CAD (coronary artery disease)    Depression    Diabetes mellitus without complication ([place])    Hypertension    Pheochromocytoma    Pheochromocytoma    Stroke [facility])    Thyroid disease    Past Surgical History:  Past Surgical History:  Procedure Laterality Date   CORONARY ARTERY BYPASS GRAFT     Right adrenal gland removal for pheochromocytoma Right    HPI:  Per most recent Hospitalist note, "50 y.o. female with medical history significant of hypertension, hyperlipidemia, diabetes mellitus, stroke with left-sided weakness, thyroid disease (patient does not know which type of thyroid disease), depression with anxiety, CAD, s/p of CABG, pheochromocytoma (s/p of right adrenal gland removal per patient), who presents with aphasia, nausea and vomiting.     Patient has history of stroke with left-sided weakness.  At her normal baseline, patient is able to perform ADLs, typically independently with assistance of a walker.  Patient was last known normal at 10 PM last night 01/18/21. Today she started having difficult speaking at about 6 AM.  She also reports bilateral leg weakness, cannot walk due to weakness.  Patient denies chest pain, cough, shortness of breath.  No fever or chills.  No symptoms of UTI.  She states that she has nausea, and has vomited at least 8 times with bilious and greenish colored vomitus.  Denies diarrhea or abdominal pain.     Patient was found to have sinus tachycardia with heart rate up to 160s, patient was given IV metoprolol in the ED.  Heart rate improved to 120s.     Heart rate has improved however patient remains tachycardic and hypertensive.   She is mentating clearly but does have significant soft tissue neck swelling.  Allergic reaction but unknown trigger.  Patient attributes it to known iodine contrast allergy.  Maintaining airway with clear lungs."    Assessment / Plan / Recommendation  Clinical Impression  Pt seen for clinical swallowing evaluation. Pt alert, pleasant, and cooperative. Dysarthria noted c/b hoarse/hypophonic vocal quality, imprecise articulation, and hypernasility with nasal emissions. Lips are notably swollen. Per RN, pt with "allergic reaction to contrast." Noted pt with CT soft tissue neck pending. RN present for evaluation.  Pt given trials of thin liquids (via tsp), nectar-thick liquids (via tsp and straw), and applesauce. Pt with s/sx moderate oral dysphagia c/b decreased labial closure resulting in anterior loss of thin liquids as well as tongue thrusting during the swallow. Pt also with prolonged A-P transit with pureed. Pt with complaints of odynophagia across trials, stating "it burns to swallow." Intermittent grimacing noted noted PO intake. Pt with s/sx concerning for pharyngeal dysphagia including immediate cough with thin liquids via teaspoon. No other overt s/sx pharyngeal dysphagia noted. Solid trials deferred due to overall clinical presentation and pt complaints of odynophagia and nausea/vomiting.   Recommend clear liquid diet with nectar-thick liquids and safe swallowing strategies/aspiration precautions as outlined below.   Should pt exhibit any increased difficulty swallowing or changes to lung status concerning for aspiration/aspiration PNA, please adjust diet as clinically indicated (e.g. make NPO) and notify SLP.   A full speech/language evaluation to be completed when pt's orofacial swelling subsides.  SLP to f/u per POC for diet tolerance and trials of upgraded textures, as appropriate.   Pt and RN made aware of diet recommendations, safe swallowing strategies/aspiration precautions, and  SLP POC. Pt verbalized understanding/agreement.   SLP Visit Diagnosis: Dysphagia, oropharyngeal phase (R13.12)    Aspiration Risk  Moderate aspiration risk    Diet Recommendation  (clear liquid nectar-thick liquids)   Liquid Administration via: Spoon;Straw Medication Administration: Crushed with puree Supervision: Staff to assist with self feeding;Intermittent supervision to cue for compensatory strategies Compensations: Minimize environmental distractions;Slow rate;Small sips/bites Postural Changes: Seated upright at 90 degrees;Remain upright for at least 30 minutes after po intake    Other  Recommendations Oral Care Recommendations: Oral care QID    Recommendations for follow up therapy are one component of a multi-disciplinary discharge planning process, led by the attending physician.  Recommendations may be updated based on patient status, additional functional criteria and insurance authorization.  Follow up Recommendations Skilled nursing-short term rehab (<3 hours/day)      Assistance Recommended at Discharge Frequent or constant Supervision/Assistance  Functional Status Assessment Patient has had a recent decline in their functional status and demonstrates the ability to make significant improvements in function in a reasonable and predictable amount of time.  Frequency and Duration min 2x/week  2 weeks       Prognosis Prognosis for Safe Diet Advancement: Fair Barriers to Reach Goals: Severity of deficits;Time post onset      Swallow Study   General Date of Onset: 01/19/21 HPI: Per most recent Hospitalist note, "50 y.o. female with medical history significant of hypertension, hyperlipidemia, diabetes mellitus, stroke with left-sided weakness, thyroid disease (patient does not know which type of thyroid disease), depression with anxiety, CAD, s/p of CABG, pheochromocytoma (s/p of right adrenal gland removal per patient), who presents with aphasia, nausea and vomiting.      Patient has history of stroke with left-sided weakness.  At her normal baseline, patient is able to perform ADLs, typically independently with assistance of a walker.  Patient was last known normal at 10 PM last night 01/18/21. Today she started having difficult speaking at about 6 AM.  She also reports bilateral leg weakness, cannot walk due to weakness.  Patient denies chest pain, cough, shortness of breath.  No fever or chills.  No symptoms of UTI.  She states that she has nausea, and has vomited at least 8 times with bilious and greenish colored vomitus.  Denies diarrhea or abdominal pain.     Patient was found to have sinus tachycardia with heart rate up to 160s, patient was given IV metoprolol in the ED.  Heart rate improved to 120s.     Heart rate has improved however patient remains tachycardic and hypertensive.  She is mentating clearly but does have significant soft tissue neck swelling.  Allergic reaction but unknown trigger.  Patient attributes it to known iodine contrast allergy.  Maintaining airway with clear lungs." Type of Study: Bedside Swallow Evaluation Previous Swallow Assessment: unknown Diet Prior to this Study:  (clear liquid diet) Temperature Spikes Noted: No Respiratory Status: Room air Behavior/Cognition: Alert;Cooperative;Pleasant mood Oral Cavity Assessment: Dry;Dried secretions Oral Care Completed by SLP: Yes Self-Feeding Abilities: Needs assist;Needs set up Patient Positioning: Upright in bed Baseline Vocal Quality: Hoarse;Low vocal intensity Volitional Cough: Strong Volitional Swallow: Able to elicit    Oral/Motor/Sensory Function Overall Oral Motor/Sensory Function: Moderate impairment Facial ROM:  (decreased labial excursion bilaterally; inconsistent lip closure with POs) Lingual ROM: Reduced right;Reduced left Lingual Symmetry:  (?  swollen appearance)   Thin Liquid Thin Liquid: Impaired Presentation: Spoon Oral Phase Impairments: Reduced labial seal Oral Phase  Functional Implications:  (anterior spillage; tongue thrusting during the swallow) Pharyngeal  Phase Impairments: Suspected delayed Swallow;Decreased hyoid-laryngeal movement;Wet Vocal Quality;Cough - Immediate    Nectar Thick Nectar Thick Liquid: Impaired Presentation: Spoon;Straw Oral Phase Impairments: Reduced labial seal Oral phase functional implications:  (tongue thrusting during the swallow)   Puree Puree: Impaired Oral Phase Impairments: Reduced labial seal;Reduced lingual movement/coordination Oral Phase Functional Implications: Prolonged oral transit (tongue thrusting during the swallow)    Cherrie Gauze, M.S., [facility] [phone] (ASCOM)   Lori [name] 01/21/2021,2:10 PM

## 2021-01-21 NOTE — Progress Notes (Signed)
Pt with increased aphasia and c/o pain in throat with swallowing. Md notified and new orders for CT scan placed. Family at bedside and updated with plan of care.

## 2021-01-22 DIAGNOSIS — R4689 Other symptoms and signs involving appearance and behavior: Secondary | ICD-10-CM

## 2021-01-22 DIAGNOSIS — R4701 Aphasia: Secondary | ICD-10-CM | POA: Diagnosis not present

## 2021-01-22 DIAGNOSIS — I639 Cerebral infarction, unspecified: Secondary | ICD-10-CM | POA: Diagnosis not present

## 2021-01-22 LAB — BASIC METABOLIC PANEL
Anion gap: 8 (ref 5–15)
BUN: 26 mg/dL — ABNORMAL HIGH (ref 6–20)
CO2: 24 mmol/L (ref 22–32)
Calcium: 8.7 mg/dL — ABNORMAL LOW (ref 8.9–10.3)
Chloride: 107 mmol/L (ref 98–111)
Creatinine, Ser: 0.89 mg/dL (ref 0.44–1.00)
GFR, Estimated: >60 mL/min (ref >60)
Glucose, Bld: 395 mg/dL — ABNORMAL HIGH (ref 70–99)
Potassium: 3.9 mmol/L (ref 3.5–5.1)
Sodium: 139 mmol/L (ref 135–145)

## 2021-01-22 LAB — CBC WITH DIFFERENTIAL/PLATELET
Abs Immature Granulocytes: 0.11 10*3/uL — ABNORMAL HIGH (ref 0.00–0.07)
Basophils Absolute: 0 10*3/uL (ref 0.0–0.1)
Basophils Relative: 0 %
Eosinophils Absolute: 0 10*3/uL (ref 0.0–0.5)
Eosinophils Relative: 0 %
HCT: 40.2 % (ref 36.0–46.0)
Hemoglobin: 13.4 g/dL (ref 12.0–15.0)
Immature Granulocytes: 1 %
Lymphocytes Relative: 6 %
Lymphs Abs: 1 10*3/uL (ref 0.7–4.0)
MCH: 26.4 pg (ref 26.0–34.0)
MCHC: 33.3 g/dL (ref 30.0–36.0)
MCV: 79.3 fL — ABNORMAL LOW (ref 80.0–100.0)
Monocytes Absolute: 1.1 10*3/uL — ABNORMAL HIGH (ref 0.1–1.0)
Monocytes Relative: 7 %
Neutro Abs: 15 10*3/uL — ABNORMAL HIGH (ref 1.7–7.7)
Neutrophils Relative %: 86 %
Platelets: 369 10*3/uL (ref 150–400)
RBC: 5.07 MIL/uL (ref 3.87–5.11)
RDW: 15 % (ref 11.5–15.5)
WBC: 17.4 10*3/uL — ABNORMAL HIGH (ref 4.0–10.5)
nRBC: 0 % (ref 0.0–0.2)

## 2021-01-22 LAB — HEMOGLOBIN A1C
Hgb A1c MFr Bld: 10.2 % — ABNORMAL HIGH (ref 4.8–5.6)
Mean Plasma Glucose: 246 mg/dL

## 2021-01-22 LAB — T3, FREE: T3, Free: 2.7 pg/mL (ref 2.0–4.4)

## 2021-01-22 LAB — MAGNESIUM: Magnesium: 2.2 mg/dL (ref 1.7–2.4)

## 2021-01-22 LAB — GLUCOSE, CAPILLARY
Glucose-Capillary: 298 mg/dL — ABNORMAL HIGH (ref 70–99)
Glucose-Capillary: 286 mg/dL — ABNORMAL HIGH (ref 70–99)
Glucose-Capillary: 356 mg/dL — ABNORMAL HIGH (ref 70–99)
Glucose-Capillary: 266 mg/dL — ABNORMAL HIGH (ref 70–99)
Glucose-Capillary: 233 mg/dL — ABNORMAL HIGH (ref 70–99)
Glucose-Capillary: 371 mg/dL — ABNORMAL HIGH (ref 70–99)

## 2021-01-22 MED ORDER — HYDRALAZINE HCL 50 MG PO TABS
25.0000 mg | ORAL_TABLET | Freq: Three times a day (TID) | ORAL | Status: DC
Start: 2021-01-22 — End: 2021-01-26
  Administered 2021-01-22 – 2021-01-26 (×11): 25 mg via ORAL
  Filled 2021-01-22 (×13): qty 1

## 2021-01-22 MED ORDER — INSULIN GLARGINE-YFGN 100 UNIT/ML ~~LOC~~ SOLN
20.0000 [IU] | Freq: Two times a day (BID) | SUBCUTANEOUS | Status: DC
Start: 2021-01-22 — End: 2021-01-23
  Administered 2021-01-22: 20 [IU] via SUBCUTANEOUS
  Filled 2021-01-22 (×3): qty 0.2

## 2021-01-22 MED ORDER — METHYLPREDNISOLONE SODIUM SUCC 40 MG IJ SOLR
40.0000 mg | Freq: Every day | INTRAMUSCULAR | Status: AC
  Administered 2021-01-22 – 2021-01-24 (×3): 40 mg via INTRAVENOUS
  Filled 2021-01-22 (×3): qty 1

## 2021-01-22 NOTE — Procedures (Signed)
Routine EEG Report  Lori [name] is a 50 y.o. female with a history of strokes and aphasia who is undergoing an EEG to evaluate for seizures.  Report: This EEG was acquired with electrodes placed according to the International 10-20 electrode system (including Fp1, Fp2, F3, F4, C3, C4, P3, P4, O1, O2, T3, T4, T5, T6, A1, A2, Fz, Cz, Pz). The following electrodes were missing or displaced: none.  The occipital dominant rhythm was 7-8 Hz. This activity is reactive to stimulation. Drowsiness was manifested by background fragmentation; deeper stages of sleep were identified by K complexes and sleep spindles. There was subtle focal slowing over the left frontal region. There were no interictal epileptiform discharges. There were no electrographic seizures identified. There was no abnormal response to photic stimulation. Hyperventilation was not performed.  Impression and clinical correlation: This EEG was obtained while awake and asleep and is abnormal due to mild diffuse slowing indicative of global cerebral dysfunction. Subtle left frontal focal slowing indicated superimposed focal dysfunction in that area. Epileptiform abnormalities were not seen during this recording.  Su Monks, MD [facility] [phone]  If 7pm- 7am, please page neurology on call as listed in [place].

## 2021-01-22 NOTE — Progress Notes (Signed)
Physical Therapy Treatment Patient Details Name: Lori [name] MRN: [id number] DOB: [birth year] Today's Date: 01/22/2021   History of Present Illness Pt is a 50 y/o F admitted on 01/19/21 after presenting with c/c of aphasia, N&V. MRI shows no acute intracranial pathology.Marland Kitchen PMH: HTN, HLD, DM, stroke with L sided weakness, thyroid disease, depression with anxiety, CAD s/p CABG, pheochromocytoma (s/p R adrenal gland removal)    PT Comments    Pt seen with OT. Focus placed on increasing functional mobility and transferring out of bed to chair.  Pt very agreeable and motivated. ModAx2 to transition from long sitting to edge of bed with use of R side rail. Pt required assist to scoot to edge for foot contact. Sitting EOB with occasional MinA to correct right lateral/posterior LOB. Bed height raised and pt able to stand with ModAx2 and shuffle feet to right towards chair, L knee blocked to prevent buckling. Pt positioned to comfort in recliner, call bell in reach, hoyer pad under pt for return to bed with nursing. Pt continues to be a great candidate for AIR once medically ready for d/c.    Recommendations for follow up therapy are one component of a multi-disciplinary discharge planning process, led by the attending physician.  Recommendations may be updated based on patient status, additional functional criteria and insurance authorization.  Follow Up Recommendations  Acute inpatient rehab (3hours/day)     Assistance Recommended at Discharge Frequent or constant Supervision/Assistance  Equipment Recommendations       Recommendations for Other Services       Precautions / Restrictions Precautions Precautions: Fall Precaution Comments: L hemi (UE>LE), L field cut Restrictions Weight Bearing Restrictions: No     Mobility  Bed Mobility Overal bed mobility: Needs Assistance Bed Mobility: Supine to Sit;Sit to Supine Rolling: Mod assist;Max assist   Supine to sit: Mod assist;+2 for  safety/equipment;+2 for physical assistance Sit to supine: Max assist;HOB elevated   General bed mobility comments: increased time    Transfers Overall transfer level: Needs assistance Equipment used: 2 person hand held assist Transfers: Sit to/from Stand Sit to Stand: Max assist;+2 physical assistance Stand pivot transfers: Max assist;+2 physical assistance         General transfer comment: PT/OT blocking knees, arm in arm assist, towards her strong R side    Ambulation/Gait                   Stairs             Wheelchair Mobility    Modified Rankin (Stroke Patients Only)       Balance Overall balance assessment: Needs assistance Sitting-balance support: Feet supported;Single extremity supported Sitting balance-Leahy Scale: Poor Sitting balance - Comments: R UE support for EOB sitting and intermittent MIN A d/t posterior lean Postural control: Posterior lean;Right lateral lean Standing balance support: During functional activity;Bilateral upper extremity supported Standing balance-Leahy Scale: Poor Standing balance comment:  (Max cues for safety)                            Cognition Arousal/Alertness: Awake/alert Behavior During Therapy: WFL for tasks assessed/performed Overall Cognitive Status: Within Functional Limits for tasks assessed                                 General Comments:  (Slightly drowsy, verbalizes with soft scratchy voice)  Exercises Other Exercises Other Exercises:  (Pt able to actively move R LE during bed mobility and provide support during transfers/standing. L LE PROM for bed mobility.)    General Comments General comments (skin integrity, edema, etc.): Pt educated on POC and set goals.  Educated nursing on benefits of using hoyer lift to return to bed      Pertinent Vitals/Pain Pain Assessment: No/denies pain Faces Pain Scale: No hurt    Home Living                           Prior Function            PT Goals (current goals can now be found in the care plan section) Acute Rehab PT Goals Patient Stated Goal: increase independence with mobility    Frequency    7X/week      PT Plan Current plan remains appropriate    Co-evaluation PT/OT/SLP Co-Evaluation/Treatment: Yes Reason for Co-Treatment: Complexity of the patient's impairments (multi-system involvement);For patient/therapist safety PT goals addressed during session: Mobility/safety with mobility;Balance OT goals addressed during session: ADL's and self-care      AM-PAC PT "6 Clicks" Mobility   Outcome Measure  Help needed turning from your back to your side while in a flat bed without using bedrails?: A Lot Help needed moving from lying on your back to sitting on the side of a flat bed without using bedrails?: Total Help needed moving to and from a bed to a chair (including a wheelchair)?: Total Help needed standing up from a chair using your arms (e.g., wheelchair or bedside chair)?: Total Help needed to walk in hospital room?: Total Help needed climbing 3-5 steps with a railing? : Total 6 Click Score: 7    End of Session Equipment Utilized During Treatment: Gait belt Activity Tolerance: Patient tolerated treatment well Patient left: in chair;with call bell/phone within reach (ST in room to see pt, Hoyer pad placed under pt for return to bed.) Nurse Communication: Mobility status;Need for lift equipment PT Visit Diagnosis: Unsteadiness on feet (R26.81);Muscle weakness (generalized) (M62.81);Difficulty in walking, not elsewhere classified (R26.2);Hemiplegia and hemiparesis Hemiplegia - Right/Left: Left Hemiplegia - dominant/non-dominant: Non-dominant Hemiplegia - caused by: Unspecified     Time: 1225-1239 PT Time Calculation (min) (ACUTE ONLY): 14 min  Charges:  $Therapeutic Activity: 8-22 mins                     Lori [name], PTA  Lori [name] 01/22/2021, 1:56  PM

## 2021-01-22 NOTE — Progress Notes (Signed)
°   01/22/21 1004  Assess: MEWS Score  Temp 98.7 F (37.1 C)  BP (!) 148/91  Pulse Rate (!) 120  Resp 14  SpO2 99 %  O2 Device Room Air  Assess: MEWS Score  MEWS Temp 0  MEWS Systolic 0  MEWS Pulse 2  MEWS RR 0  MEWS LOC 0  MEWS Score 2  MEWS Score Color Yellow  Assess: if the MEWS score is Yellow or Red  Were vital signs taken at a resting state? Yes  Focused Assessment No change from prior assessment  MEWS guidelines implemented *See Row Information* No, previously yellow, continue vital signs every 4 hours  Treat  Pain Scale 0-10  Pain Score 0  Notify: Charge Nurse/RN  Name of Charge Nurse/RN Notified JO RN  Date Charge Nurse/RN Notified 01/22/21  Time Charge Nurse/RN Notified 1000  Assess: SIRS CRITERIA  SIRS Temperature  0  SIRS Pulse 1  SIRS Respirations  0  SIRS WBC 0  SIRS Score Sum  1

## 2021-01-22 NOTE — Progress Notes (Signed)
Eeg done 

## 2021-01-22 NOTE — Progress Notes (Signed)
Inpatient Diabetes Program Recommendations  AACE/ADA: New Consensus Statement on Inpatient Glycemic Control   Target Ranges:  Prepandial:   less than 140 mg/dL      Peak postprandial:   less than 180 mg/dL (1-2 hours)      Critically ill patients:  140 - 180 mg/dL    Latest Reference Range & Units 01/21/21 07:37 01/21/21 11:50 01/21/21 16:09 01/21/21 21:15 01/21/21 23:49 01/22/21 07:22  Glucose-Capillary 70 - 99 mg/dL 322 (H) 323 (H) 366 (H) 353 (H) 360 (H) 298 (H)   Review of Glycemic Control  Diabetes history: DM2 Outpatient Diabetes medications: Levemir 14 units BID, Novolog 0-10 units TID with meals Current orders for Inpatient glycemic control: Semglee 17 units BID, Novolog 0-9 units TID with meals, Novolog 0-5 units QHS; Solumedrol 40 mg daily  Inpatient Diabetes Program Recommendations:    Insulin: Please consider increasing Semglee to 20 units BID. Once diet is advanced and patient eating well, may want to consider ordering Novolog 3 units TID with meals for meal coverage if patient eats at least 50% of meals.  Thanks, Barnie Alderman, RN, MSN, CDE Diabetes Coordinator Inpatient Diabetes Program [phone] (Team Pager from 8am to 5pm)

## 2021-01-22 NOTE — Progress Notes (Signed)
Subjective: Lip swelling stable from yesterday. She is sedated on my examination, and unable to answer orientation questions.  Objective: Current vital signs: BP (!) 144/98 (BP Location: Right Arm)    Pulse 100    Temp 97.6 F (36.4 C) (Oral)    Resp 16    Ht 5\' 2"  (1.575 m)    Wt 97.7 kg    SpO2 100%    BMI 39.40 kg/m  Vital signs in last 24 hours: Temp:  [97.6 F (36.4 C)-99.5 F (37.5 C)] 97.6 F (36.4 C) (12/19 1631) Pulse Rate:  [36-120] 100 (12/19 1631) Resp:  [11-23] 16 (12/19 1631) BP: (108-169)/(77-138) 144/98 (12/19 1631) SpO2:  [77 %-100 %] 100 % (12/19 1631)  Intake/Output from previous day: 12/18 0701 - 12/19 0700 In: 1360.4 [P.O.:480; I.V.:825; IV Piggyback:55.4] Out: 1625 [Urine:1625] Intake/Output this shift: No intake/output data recorded. Nutritional status:  Diet Order             Diet clear liquid Room service appropriate? Yes; Fluid consistency: Nectar Thick  Diet effective now                  HEENT-  [place]/AT. Lip swelling is improved from yesterday  Lungs- Respirations unlabored but there is a faint, hushed stridorous quality to some inspirations Extremities- Warm and well perfused   Neurological Examination Mental Status: Drowsy in the context of having been medicated with Benadryl. Does not follow commands today and does not answer orientation questions. Cranial Nerves: PERRL, tracks examiner with apparent EOM, corneals intact, face symmetric at rest Motor: LUE: Flexion contractures at elbow, wrist and digits. Arm adducted at shoulder. Increased tone. No movement to command.  LLE: Increased tone. Falls to bed after passive elevation and release, with minimal effort against gravity. Spontaneous clonus to left foot intermittently. Sustained clonus with passive dorsiflexion by examiner.  Withdraws BLE to noxious stimuli Cerebellar: Unable to assess Gait: Unable to assess.   Lab Results: Results for orders placed or performed during the hospital  encounter of 01/19/21 (from the past 48 hour(s))  Glucose, capillary     Status: Abnormal   Collection Time: 01/20/21  9:36 PM  Result Value Ref Range   Glucose-Capillary 252 (H) 70 - 99 mg/dL    Comment: Glucose reference range applies only to samples taken after fasting for at least 8 hours.  Glucose, capillary     Status: Abnormal   Collection Time: 01/21/21  7:37 AM  Result Value Ref Range   Glucose-Capillary 322 (H) 70 - 99 mg/dL    Comment: Glucose reference range applies only to samples taken after fasting for at least 8 hours.  Glucose, capillary     Status: Abnormal   Collection Time: 01/21/21 11:50 AM  Result Value Ref Range   Glucose-Capillary 323 (H) 70 - 99 mg/dL    Comment: Glucose reference range applies only to samples taken after fasting for at least 8 hours.  Glucose, capillary     Status: Abnormal   Collection Time: 01/21/21  4:09 PM  Result Value Ref Range   Glucose-Capillary 366 (H) 70 - 99 mg/dL    Comment: Glucose reference range applies only to samples taken after fasting for at least 8 hours.  Glucose, capillary     Status: Abnormal   Collection Time: 01/21/21  9:15 PM  Result Value Ref Range   Glucose-Capillary 353 (H) 70 - 99 mg/dL    Comment: Glucose reference range applies only to samples taken after fasting for at least  8 hours.  Glucose, capillary     Status: Abnormal   Collection Time: 01/21/21 11:49 PM  Result Value Ref Range   Glucose-Capillary 360 (H) 70 - 99 mg/dL    Comment: Glucose reference range applies only to samples taken after fasting for at least 8 hours.  Glucose, capillary     Status: Abnormal   Collection Time: 01/22/21  7:22 AM  Result Value Ref Range   Glucose-Capillary 298 (H) 70 - 99 mg/dL    Comment: Glucose reference range applies only to samples taken after fasting for at least 8 hours.  CBC with Differential/Platelet     Status: Abnormal   Collection Time: 01/22/21  8:50 AM  Result Value Ref Range   WBC 17.4 (H) 4.0 - 10.5  K/uL   RBC 5.07 3.87 - 5.11 MIL/uL   Hemoglobin 13.4 12.0 - 15.0 g/dL   HCT 40.2 36.0 - 46.0 %   MCV 79.3 (L) 80.0 - 100.0 fL   MCH 26.4 26.0 - 34.0 pg   MCHC 33.3 30.0 - 36.0 g/dL   RDW 15.0 11.5 - 15.5 %   Platelets 369 150 - 400 K/uL   nRBC 0.0 0.0 - 0.2 %   Neutrophils Relative % 86 %   Neutro Abs 15.0 (H) 1.7 - 7.7 K/uL   Lymphocytes Relative 6 %   Lymphs Abs 1.0 0.7 - 4.0 K/uL   Monocytes Relative 7 %   Monocytes Absolute 1.1 (H) 0.1 - 1.0 K/uL   Eosinophils Relative 0 %   Eosinophils Absolute 0.0 0.0 - 0.5 K/uL   Basophils Relative 0 %   Basophils Absolute 0.0 0.0 - 0.1 K/uL   Immature Granulocytes 1 %   Abs Immature Granulocytes 0.11 (H) 0.00 - 0.07 K/uL    Comment: Performed at [facility], [address] [place], [place] [address]  Basic metabolic panel     Status: Abnormal   Collection Time: 01/22/21  8:50 AM  Result Value Ref Range   Sodium 139 135 - 145 mmol/L   Potassium 3.9 3.5 - 5.1 mmol/L   Chloride 107 98 - 111 mmol/L   CO2 24 22 - 32 mmol/L   Glucose, Bld 395 (H) 70 - 99 mg/dL    Comment: Glucose reference range applies only to samples taken after fasting for at least 8 hours.   BUN 26 (H) 6 - 20 mg/dL   Creatinine, Ser 0.89 0.44 - 1.00 mg/dL   Calcium 8.7 (L) 8.9 - 10.3 mg/dL   GFR, Estimated >60 >60 mL/min    Comment: (NOTE) Calculated using the CKD-EPI Creatinine Equation (2021)    Anion gap 8 5 - 15    Comment: Performed at [facility], [address] [place], [place] [address]  Magnesium     Status: None   Collection Time: 01/22/21  8:50 AM  Result Value Ref Range   Magnesium 2.2 1.7 - 2.4 mg/dL    Comment: Performed at [facility], [address] [place], [place] [address]  Glucose, capillary     Status: Abnormal   Collection Time: 01/22/21 10:06 AM  Result Value Ref Range   Glucose-Capillary 356 (H) 70 - 99 mg/dL    Comment: Glucose reference range applies only to samples taken after fasting for at least 8  hours.  Glucose, capillary     Status: Abnormal   Collection Time: 01/22/21  1:20 PM  Result Value Ref Range   Glucose-Capillary 266 (H) 70 - 99 mg/dL  Comment: Glucose reference range applies only to samples taken after fasting for at least 8 hours.  Glucose, capillary     Status: Abnormal   Collection Time: 01/22/21  4:29 PM  Result Value Ref Range   Glucose-Capillary 233 (H) 70 - 99 mg/dL    Comment: Glucose reference range applies only to samples taken after fasting for at least 8 hours.    Recent Results (from the past 240 hour(s))  Blood Culture (routine x 2)     Status: None (Preliminary result)   Collection Time: 01/19/21  8:50 AM   Specimen: BLOOD  Result Value Ref Range Status   Specimen Description BLOOD BLOOD LEFT FOREARM  Final   Special Requests   Final    BOTTLES DRAWN AEROBIC AND ANAEROBIC Blood Culture results may not be optimal due to an inadequate volume of blood received in culture bottles   Culture   Final    NO GROWTH 3 DAYS Performed at [facility], [address], [place], [place] [address]    Report Status PENDING  Incomplete  Resp Panel by RT-PCR (Flu A&B, Covid) Nasopharyngeal Swab     Status: None   Collection Time: 01/19/21  9:44 AM   Specimen: Nasopharyngeal Swab; Nasopharyngeal(NP) swabs in vial transport medium  Result Value Ref Range Status   SARS Coronavirus 2 by RT PCR NEGATIVE NEGATIVE Final    Comment: (NOTE) SARS-CoV-2 target nucleic acids are NOT DETECTED.  The SARS-CoV-2 RNA is generally detectable in upper respiratory specimens during the acute phase of infection. The lowest concentration of SARS-CoV-2 viral copies this assay can detect is 138 copies/mL. A negative result does not preclude SARS-Cov-2 infection and should not be used as the sole basis for treatment or other patient management decisions. A negative result may occur with  improper specimen collection/handling, submission of specimen other than nasopharyngeal  swab, presence of viral mutation(s) within the areas targeted by this assay, and inadequate number of viral copies(<138 copies/mL). A negative result must be combined with clinical observations, patient history, and epidemiological information. The expected result is Negative.  Fact Sheet for Patients:  EntrepreneurPulse.com.au  Fact Sheet for Healthcare Providers:  IncredibleEmployment.be  This test is no t yet approved or cleared by the [place] FDA and  has been authorized for detection and/or diagnosis of SARS-CoV-2 by FDA under an Emergency Use Authorization (EUA). This EUA will remain  in effect (meaning this test can be used) for the duration of the COVID-19 declaration under Section 564(b)(1) of the Act, 21 U.S.C.section 360bbb-3(b)(1), unless the authorization is terminated  or revoked sooner.       Influenza A by PCR NEGATIVE NEGATIVE Final   Influenza B by PCR NEGATIVE NEGATIVE Final    Comment: (NOTE) The Xpert Xpress SARS-CoV-2/FLU/RSV plus assay is intended as an aid in the diagnosis of influenza from Nasopharyngeal swab specimens and should not be used as a sole basis for treatment. Nasal washings and aspirates are unacceptable for Xpert Xpress SARS-CoV-2/FLU/RSV testing.  Fact Sheet for Patients: EntrepreneurPulse.com.au  Fact Sheet for Healthcare Providers: IncredibleEmployment.be  This test is not yet approved or cleared by the [place] FDA and has been authorized for detection and/or diagnosis of SARS-CoV-2 by FDA under an Emergency Use Authorization (EUA). This EUA will remain in effect (meaning this test can be used) for the duration of the COVID-19 declaration under Section 564(b)(1) of the Act, 21 U.S.C. section 360bbb-3(b)(1), unless the authorization is terminated or revoked.  Performed at [facility], [address] [place], [place] [address]   Culture,  blood (Routine X 2) w Reflex to ID Panel     Status: None (Preliminary result)   Collection Time: 01/19/21 10:02 AM   Specimen: BLOOD  Result Value Ref Range Status   Specimen Description BLOOD RIGHT FOA  Final   Special Requests   Final    BOTTLES DRAWN AEROBIC AND ANAEROBIC Blood Culture adequate volume   Culture   Final    NO GROWTH 3 DAYS Performed at [facility], [address] [place], [place] [address]    Report Status PENDING  Incomplete    Lipid Panel Recent Labs    01/20/21 0709  CHOL 165  TRIG 81  HDL 54  CHOLHDL 3.1  VLDL 16  LDLCALC 95     Studies/Results: CT SOFT TISSUE NECK WO CONTRAST  Result Date: 01/21/2021 CLINICAL DATA:  Soft tissue swelling, infection suspected EXAM: CT NECK WITHOUT CONTRAST TECHNIQUE: Multidetector CT imaging of the neck was performed following the standard protocol without intravenous contrast. COMPARISON:  CTA head neck 01/19/2021 FINDINGS: Pharynx and larynx: The nasal cavity and nasopharynx are normal. The oropharynx and oral cavity are normal. The hypopharynx and larynx are normal. There is no abnormal soft tissue mass or fluid collection, within the confines of noncontrast technique. The parapharyngeal spaces are clear. The airway is patent. Salivary glands: Parotid and submandibular glands are normal. Thyroid: Unremarkable. Lymph nodes: There is no pathologic lymphadenopathy in the neck. Vascular: There is calcified atherosclerotic plaque in the bilateral carotid bulbs. Limited intracranial: The imaged portions of the intracranial compartment are unremarkable. Visualized orbits: The globes and orbits are unremarkable. Mastoids and visualized paranasal sinuses: Paranasal sinuses and mastoid air cells are clear. Skeleton: There is no significant degenerative change of the cervical spine. There is no acute osseous abnormality or aggressive osseous lesion. There is no visible canal hematoma Upper chest: . the imaged lung apices are  clear. Other: None. IMPRESSION: Unremarkable noncontrast CT of the neck with no acute finding to explain the patient's symptoms. Electronically Signed   By: Valetta Mole M.D.   On: 01/21/2021 16:38   MR THORACIC SPINE WO CONTRAST  Result Date: 01/21/2021 CLINICAL DATA:  Demyelinating disease EXAM: MRI THORACIC SPINE WITHOUT CONTRAST TECHNIQUE: Multiplanar, multisequence MR imaging of the thoracic spine was performed. No intravenous contrast was administered. COMPARISON:  None. FINDINGS: Evaluation is somewhat limited by motion artifact. Alignment:  Physiologic. Vertebrae: No fracture, evidence of discitis, or bone lesion. Cord:  Normal signal and morphology. Paraspinal and other soft tissues: Small right pleural effusion. Disc levels: T6-T7: Small central disc protrusion. No spinal canal stenosis or neural foraminal narrowing. T11-T12: Small left paracentral disc protrusion. No spinal canal stenosis or neural foraminal narrowing. Other thoracic vertebral levels are negative. IMPRESSION: 1. No evidence of demyelinating disease in the thoracic spinal cord. 2. No spinal canal stenosis or neural foraminal narrowing. Electronically Signed   By: Merilyn Baba M.D.   On: 01/21/2021 22:22   ECHOCARDIOGRAM COMPLETE  Result Date: 01/21/2021    ECHOCARDIOGRAM REPORT   Patient Name:   Lori [name] Date of Exam: 01/21/2021 Medical Rec #:  [id number]           Height:       62.0 in Accession #:    [id number]          Weight:       215.4 lb Date of Birth:  Jun 21, 1970  BSA:          1.973 m Patient Age:    74 years            BP:           124/89 mmHg Patient Gender: F                   HR:           107 bpm. Exam Location:  [facility] Procedure: 2D Echo Indications:     Stroke I63.9  History:         Patient has no prior history of Echocardiogram examinations.  Sonographer:     Kathlen Brunswick RDCS Referring Phys:  [id number] Soledad Gerlach NIU Diagnosing Phys: Ida Rogue MD  Sonographer Comments: Technically challenging  study due to limited acoustic windows, suboptimal subcostal window and suboptimal apical window. Image acquisition challenging due to patient body habitus. IMPRESSIONS  1. Left ventricular ejection fraction, by estimation, is 60 to 65%. The left ventricle has normal function. The left ventricle has no regional wall motion abnormalities. There is moderate left ventricular hypertrophy. Left ventricular diastolic parameters are consistent with Grade I diastolic dysfunction (impaired relaxation).  2. Right ventricular systolic function is normal. The right ventricular size is normal.  3. The mitral valve is normal in structure. No evidence of mitral valve regurgitation. No evidence of mitral stenosis.  4. Tricuspid valve regurgitation is mild to moderate.  5. The aortic valve is normal in structure. Aortic valve regurgitation is not visualized. Aortic valve sclerosis is present, with no evidence of aortic valve stenosis.  6. The inferior vena cava is normal in size with greater than 50% respiratory variability, suggesting right atrial pressure of 3 mmHg. FINDINGS  Left Ventricle: Left ventricular ejection fraction, by estimation, is 60 to 65%. The left ventricle has normal function. The left ventricle has no regional wall motion abnormalities. The left ventricular internal cavity size was normal in size. There is  moderate left ventricular hypertrophy. Left ventricular diastolic parameters are consistent with Grade I diastolic dysfunction (impaired relaxation). Right Ventricle: The right ventricular size is normal. No increase in right ventricular wall thickness. Right ventricular systolic function is normal. Left Atrium: Left atrial size was normal in size. Right Atrium: Right atrial size was normal in size. Pericardium: There is no evidence of pericardial effusion. Mitral Valve: The mitral valve is normal in structure. No evidence of mitral valve regurgitation. No evidence of mitral valve stenosis. Tricuspid Valve:  The tricuspid valve is normal in structure. Tricuspid valve regurgitation is mild to moderate. No evidence of tricuspid stenosis. Aortic Valve: The aortic valve is normal in structure. Aortic valve regurgitation is not visualized. Aortic valve sclerosis is present, with no evidence of aortic valve stenosis. Aortic valve peak gradient measures 8.0 mmHg. Pulmonic Valve: The pulmonic valve was normal in structure. Pulmonic valve regurgitation is not visualized. No evidence of pulmonic stenosis. Aorta: The aortic root is normal in size and structure. Venous: The inferior vena cava is normal in size with greater than 50% respiratory variability, suggesting right atrial pressure of 3 mmHg. IAS/Shunts: No atrial level shunt detected by color flow Doppler.  LEFT VENTRICLE PLAX 2D LVIDd:         3.00 cm   Diastology LVIDs:         2.10 cm   LV e' medial:    6.20 cm/s LV PW:         1.50 cm   LV E/e' medial:  14.3  LV IVS:        1.30 cm   LV e' lateral:   5.98 cm/s LVOT diam:     1.80 cm   LV E/e' lateral: 14.8 LV SV:         42 LV SV Index:   22 LVOT Area:     2.54 cm  LEFT ATRIUM         Index LA diam:    3.10 cm 1.57 cm/m  AORTIC VALVE                 PULMONIC VALVE AV Area (Vmax): 1.53 cm     PV Vmax:       1.18 m/s AV Vmax:        141.00 cm/s  PV Peak grad:  5.6 mmHg AV Peak Grad:   8.0 mmHg LVOT Vmax:      84.90 cm/s LVOT Vmean:     60.700 cm/s LVOT VTI:       0.167 m  AORTA Ao Root diam: 2.50 cm Ao Asc diam:  2.30 cm MITRAL VALVE                TRICUSPID VALVE MV Area (PHT): 6.43 cm     TV Peak grad:   19.2 mmHg MV Decel Time: 118 msec     TV Vmax:        2.19 m/s MV E velocity: 88.70 cm/s MV A velocity: 105.00 cm/s  SHUNTS MV E/A ratio:  0.84         Systemic VTI:  0.17 m                             Systemic Diam: 1.80 cm Ida Rogue MD Electronically signed by Ida Rogue MD Signature Date/Time: 01/21/2021/7:03:01 PM    Final     Medications: Scheduled:   stroke: mapping our early stages of recovery book    Does not apply Once   amLODipine  10 mg Oral Daily   aspirin  81 mg Oral Daily   atorvastatin  80 mg Oral Daily   chlorhexidine  15 mL Mouth Rinse BID   Chlorhexidine Gluconate Cloth  6 each Topical Daily   cyclobenzaprine  5 mg Oral TID   diphenhydrAMINE  25 mg Intravenous Q8H   docusate sodium  200 mg Oral QHS   escitalopram  20 mg Oral Daily   gabapentin  200 mg Oral TID   heparin  5,000 Units Subcutaneous Q8H   hydrALAZINE  25 mg Oral Q8H   insulin aspart  0-5 Units Subcutaneous QHS   insulin aspart  0-9 Units Subcutaneous TID WC   insulin glargine-yfgn  20 Units Subcutaneous BID   lisinopril  40 mg Oral Daily   loratadine  10 mg Oral Daily   LORazepam  0.25 mg Oral QHS   mouth rinse  15 mL Mouth Rinse q12n4p   melatonin  10 mg Oral QHS   methylPREDNISolone (SOLU-MEDROL) injection  40 mg Intravenous Daily   metoCLOPramide (REGLAN) injection  10 mg Intravenous Q8H   metoprolol tartrate  100 mg Oral BID   pantoprazole (PROTONIX) IV  40 mg Intravenous Q12H   sodium chloride flush  3 mL Intravenous Once   ticagrelor  90 mg Oral BID   Continuous:  famotidine (PEPCID) IV 20 mg (01/22/21 1037)   Assessment: 50 year old female presenting with acute onset of mutism and BLE weakness. Also with intermittent vomiting.  Overall presentation most consistent with a small brainstem or thalamic stroke versus a left perisylvian stroke.  1.. Exams and mental status are fluctuating. She is sedated from the benadryl, but aphasia is worse on my exam than it was yesterday. Concern for seizures given multiple prior infarcts.   2. MRI brain performed 01/20/21 shows no acute intracranial pathology. There is moderate parenchymal volume loss and chronic white matter microangiopathy, advanced for age. 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 Multiple small chronic microhemorrhages in a central distribution, likely hypertensive in  etiology. 3. CTA of head and neck: Generalized and severe atherosclerosis, especially for age. Most severe abnormalities are in the posterior circulation where there are left vertebral and basilar occlusions which are age indeterminate, as well as severe right V4 segment stenosis. No flow limiting stenosis in the cervical carotid circulation. Probable flow limiting stenosis at the carotid siphons, especially on the right. 4. Lip swelling likely 2/2 allergic rxn to iodinated contrast or possibly ticlodipine. Pt is on benadryl and solumedrol. 5. Patient was previously a clinic patient of mine 2 years ago in [place], [place]. At that time she told me that she had a hx primary brain malignancy resected yrs ago at [facility]. I will obtain a records release from pt tomorrow if she is able to consent so that we can further clarify this. I would like to get MRI brain wwo contrast but as I recall she has a mild allergy to gad and requires steroid prep for MRI. Given that she is already on solumedrol and benadryl for another allergic rxn this hospital stay, will hold off on this. 6. MRI t spine wo contrast 12/18 unrevealing   Recommendations: 1. BP management. Averaging about 102-725 systolic. Goal given her severe stenoses on CTA should be a SBP of 130-150 for now.   2. TTE no intracardiac clot 3. Check Hgb A1xc. BG in 300s since admission (pt is also on solumedrol) 4. Cardiac telemetry 5. Frequent neuro checks 6. PT/OT/Speech 7. 80 mg atorvastatin   8. ASA 81 mg po qd. Has an allergy to clopidogrel. 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    9. IVF 10. Monitor her lip swelling closely. Also monitor breathing, breath sounds especially in the neck region and continuous pulse ox. Respiratory therapy for testing of NIF and FVC q12h.   11. rEEG today  Will continue to follow.  Su Monks, MD [facility] [phone]  If 7pm- 7am, please page neurology on call as listed in [place].

## 2021-01-22 NOTE — Progress Notes (Signed)
Patient alert but disoriented. No complaints of pain. On room air. Pure-wick intact. Maintenance fluids discontinued. Report given to [employer].

## 2021-01-22 NOTE — Progress Notes (Signed)
Pt is not able to perform NIF/FVC

## 2021-01-22 NOTE — Progress Notes (Signed)
Speech Language Pathology Treatment:    Patient Details Name: Lori [name] MRN: [id number] DOB: Jun 08, 1970 Today's Date: 01/22/2021 Time: 1245-1310 SLP Time Calculation (min) (ACUTE ONLY): 25 min  Assessment / Plan / Recommendation Clinical Impression  Pt seen for diet tolerance. Pt received upright in recliner. Finishing with OT/PTA upon SLP entrance to room. Spoke with RN who noted pt coughing with straw sips of nectar-thick liquids earlier today which is a change from previous day's evaluation.  Oral care performed via foam toothette. Pt coughing with oral care. Noted pt swallowed liquid from toothette - likely airway violation causing prolonged coughing spell.    Oral motor exam notable for labial swelling (particularly lower lip) affecting ROM and strength, reduced facial and lingual ROM bilaterally, and aphonic vocal quality when speech tasks. Adequate vocal quality for cued and spontaneous cough production. Hypernasality with nasal emissions persist.    Pt given trials of nectar-thick liquids (total ~4 oz; via teaspoon and straw sip) and applesauce (~4 oz; via teaspoon). Pt with s/sx moderate oral dysphagia c/b decreased labial closure resulting in tongue thrusting during the swallow. Pt also with prolonged A-P transit with pureed. Pt with increased s/sx concerning for pharyngeal dysphagia including immediate/prolonged cough with nectar-thick liquids via straw sip and intermittent secondary swallow response with all textures given.   Recommend clear liquid diet with nectar-thick liquids and safe swallowing strategies/aspiration precautions as outlined below including nectar-thick liquids via TEASPOON ONLY.   Should pt exhibit any increased difficulty swallowing or changes to lung status concerning for aspiration/aspiration PNA, please adjust diet as clinically indicated (e.g. make NPO) and notify SLP.    A full speech/language evaluation to be completed when pt's orofacial swelling  subsides.    SLP to f/u for MBS Study next date to instrumentally evaluate pt's oropharyngeal swallow functoin.    Pt and RN made aware of diet recommendations, safe swallowing strategies/aspiration precautions, and SLP POC. Pt verbalized understanding/agreement.    HPI HPI: Per most recent Hospitalist note, "50 y.o. female with medical history significant of hypertension, hyperlipidemia, diabetes mellitus, stroke with left-sided weakness, thyroid disease (patient does not know which type of thyroid disease), depression with anxiety, CAD, s/p of CABG, pheochromocytoma (s/p of right adrenal gland removal per patient), who presents with aphasia, nausea and vomiting.     Patient has history of stroke with left-sided weakness.  At her normal baseline, patient is able to perform ADLs, typically independently with assistance of a walker.  Patient was last known normal at 10 PM last night 01/18/21. Today she started having difficult speaking at about 6 AM.  She also reports bilateral leg weakness, cannot walk due to weakness.  Patient denies chest pain, cough, shortness of breath.  No fever or chills.  No symptoms of UTI.  She states that she has nausea, and has vomited at least 8 times with bilious and greenish colored vomitus.  Denies diarrhea or abdominal pain.     Patient was found to have sinus tachycardia with heart rate up to 160s, patient was given IV metoprolol in the ED.  Heart rate improved to 120s.     Heart rate has improved however patient remains tachycardic and hypertensive.  She is mentating clearly but does have significant soft tissue neck swelling.  Allergic reaction but unknown trigger.  Patient attributes it to known iodine contrast allergy.  Maintaining airway with clear lungs."      SLP Plan  MBS (tentatively scheduled for 12/20)      Recommendations for  follow up therapy are one component of a multi-disciplinary discharge planning process, led by the attending physician.   Recommendations may be updated based on patient status, additional functional criteria and insurance authorization.    Recommendations  Diet recommendations: Nectar-thick liquid (clear liquid) Liquids provided via: Teaspoon Medication Administration: Crushed with puree Supervision: Patient able to self feed;Intermittent supervision to cue for compensatory strategies (with set up) Compensations: Minimize environmental distractions;Slow rate;Small sips/bites (teaspoon only) Postural Changes and/or Swallow Maneuvers: Out of bed for meals;Seated upright 90 degrees;Upright 30-60 min after meal                Oral Care Recommendations: Oral care QID Follow Up Recommendations: Skilled nursing-short term rehab (<3 hours/day) SLP Visit Diagnosis: Dysphagia, oropharyngeal phase (R13.12) Plan: MBS (tentatively scheduled for 12/20)         Cherrie Gauze, M.S., [facility] [phone] ([place])   Quintella Baton  01/22/2021, 1:33 PM

## 2021-01-22 NOTE — Progress Notes (Signed)
Occupational Therapy Treatment Patient Details Name: Lori [name] MRN: [id number] DOB: 1971/01/04 Today's Date: 01/22/2021   History of present illness Pt is a 50 y/o F admitted on 01/19/21 after presenting with c/c of aphasia, N&V. MRI shows no acute intracranial pathology.Marland Kitchen PMH: HTN, HLD, DM, stroke with L sided weakness, thyroid disease, depression with anxiety, CAD s/p CABG, pheochromocytoma (s/p R adrenal gland removal)   OT comments  Pt seen for OT tx this date to f/u re: Safety with ADLs/ADL mobility. She presents this date somewhat drowsy, reporting she's had Benadryl this AM, but she is pleasant and agreeable to session. She is seen for PT/OT co-tx to maximize services d/t low fxl activity tolernace. She requires MOD A +2 to come to EOB sitting and demos P sitting balance requiring R UE support and demonstrating instances of posterior lean. She is able to adjust sitting balance with verbal cues for righting response. Pt requires MAX A +2 arm in arm to STS and SPS from bed to chair. She is left in recliner with hoyer pad underneath. OT engages pt in seated g/h tasks including oral care with MOD A with use of R UE. Pt with chair alarm set, and SLP presenting to start session. Will continue to follow acutely. Continue to anticipate she will require AIR f/u therapy services.    Recommendations for follow up therapy are one component of a multi-disciplinary discharge planning process, led by the attending physician.  Recommendations may be updated based on patient status, additional functional criteria and insurance authorization.    Follow Up Recommendations  Acute inpatient rehab (3hours/day)    Assistance Recommended at Discharge Frequent or constant Supervision/Assistance  Equipment Recommendations  Other (comment) (defer)    Recommendations for Other Services Rehab consult    Precautions / Restrictions Precautions Precautions: Fall Precaution Comments: L hemi (UE>LE), L field  cut Restrictions Weight Bearing Restrictions: No       Mobility Bed Mobility Overal bed mobility: Needs Assistance Bed Mobility: Supine to Sit;Sit to Supine     Supine to sit: Mod assist;+2 for safety/equipment;+2 for physical assistance     General bed mobility comments: increased time    Transfers Overall transfer level: Needs assistance Equipment used: 2 person hand held assist Transfers: Sit to/from Stand;Bed to chair/wheelchair/BSC Sit to Stand: Max assist;+2 physical assistance Stand pivot transfers: Max assist;+2 physical assistance         General transfer comment: PT/OT blocking knees, arm in arm assist, towards her strong R side     Balance Overall balance assessment: Needs assistance Sitting-balance support: Feet supported;Single extremity supported Sitting balance-Leahy Scale: Poor Sitting balance - Comments: R UE support for EOB sitting and intermittent MIN A d/t posterior lean Postural control: Posterior lean Standing balance support: During functional activity;Bilateral upper extremity supported Standing balance-Leahy Scale: Poor Standing balance comment: 2p arm in arm                           ADL either performed or assessed with clinical judgement   ADL Overall ADL's : Needs assistance/impaired     Grooming: Oral care;Wash/dry face;Moderate assistance;Sitting Grooming Details (indicate cue type and reason): chair position in bed                                    Extremity/Trunk Assessment  Vision       Perception     Praxis      Cognition Arousal/Alertness: Awake/alert Behavior During Therapy: WFL for tasks assessed/performed Overall Cognitive Status: Within Functional Limits for tasks assessed                                 General Comments: drowsy d/t benadryl, also with very soft/raspy voice. She is able to follow all commands, oriented to self, place and situation.  Requires extra time to process/respond          Exercises Other Exercises Other Exercises: OT engages pt in oral care and ed re: rationale for sitting all the way up during oral care.   Shoulder Instructions       General Comments      Pertinent Vitals/ Pain       Pain Assessment: Faces Faces Pain Scale: No hurt  Home Living                                          Prior Functioning/Environment              Frequency  Min 3X/week        Progress Toward Goals  OT Goals(current goals can now be found in the care plan section)  Progress towards OT goals: Progressing toward goals  Acute Rehab OT Goals Patient Stated Goal: to get stronger OT Goal Formulation: With patient Time For Goal Achievement: 02/03/21 Potential to Achieve Goals: Good  Plan Discharge plan remains appropriate    Co-evaluation    PT/OT/SLP Co-Evaluation/Treatment: Yes Reason for Co-Treatment: Complexity of the patient's impairments (multi-system involvement);For patient/therapist safety PT goals addressed during session: Mobility/safety with mobility OT goals addressed during session: ADL's and self-care      AM-PAC OT "6 Clicks" Daily Activity     Outcome Measure   Help from another person eating meals?: A Little Help from another person taking care of personal grooming?: A Little Help from another person toileting, which includes using toliet, bedpan, or urinal?: A Lot Help from another person bathing (including washing, rinsing, drying)?: A Lot Help from another person to put on and taking off regular upper body clothing?: A Lot Help from another person to put on and taking off regular lower body clothing?: Total 6 Click Score: 13    End of Session Equipment Utilized During Treatment: Gait belt  OT Visit Diagnosis: Unsteadiness on feet (R26.81);Muscle weakness (generalized) (M62.81);Other symptoms and signs involving the nervous system (R29.898);Hemiplegia and  hemiparesis Hemiplegia - Right/Left: Left Hemiplegia - dominant/non-dominant: Non-Dominant Hemiplegia - caused by: Cerebral infarction   Activity Tolerance Patient tolerated treatment well   Patient Left in bed;with call bell/phone within reach;with bed alarm set   Nurse Communication Mobility status        Time: 3704-8889 OT Time Calculation (min): 31 min  Charges: OT General Charges $OT Visit: 1 Visit OT Treatments $Self Care/Home Management : 8-22 mins  Gerrianne Scale, MS, OTR/L ascom [phone] 01/22/21, 1:17 PM

## 2021-01-22 NOTE — Progress Notes (Signed)
Inpatient Rehab Admissions Coordinator:  Attempted to reach pt's daughter on the telephone again. Left a message; awaiting return call. Will continue to follow.   Gayland Curry, [place], [place] Admissions Coordinator [phone]

## 2021-01-22 NOTE — Progress Notes (Signed)
PROGRESS NOTE    Lori [name]  TKW:[id number] DOB: Nov 14, 1970 DOA: 01/19/2021 PCP: Pcp, No    Brief Narrative:  50 y.o. female with medical history significant of hypertension, hyperlipidemia, diabetes mellitus, stroke with left-sided weakness, thyroid disease (patient does not know which type of thyroid disease), depression with anxiety, CAD, s/p of CABG, pheochromocytoma (s/p of right adrenal gland removal per patient), who presents with aphasia, nausea and vomiting.   Patient has history of stroke with left-sided weakness.  At her normal baseline, patient is able to perform ADLs, typically independently with assistance of a walker.  Patient was last known normal at 10 PM last night 01/18/21. Today she started having difficult speaking at about 6 AM.  She also reports bilateral leg weakness, cannot walk due to weakness.  Patient denies chest pain, cough, shortness of breath.  No fever or chills.  No symptoms of UTI.  She states that she has nausea, and has vomited at least 8 times with bilious and greenish colored vomitus.  Denies diarrhea or abdominal pain.   Patient was found to have sinus tachycardia with heart rate up to 160s, patient was given IV metoprolol in the ED.  Heart rate improved to 120s.  Heart rate has improved however patient remains tachycardic and hypertensive.  She is mentating clearly but does have significant soft tissue neck swelling.  Allergic reaction but unknown trigger.  Patient attributes it to known iodine contrast allergy.  Maintaining airway with clear lungs.  12/19: Last night called by bedside RN as patient had episode of transient unresponsiveness lasting approximately 5 minutes.  Recovered spontaneously.  This morning patient will weak voice but seems to be mentating clearly.  She is somewhat disoriented to time and place.  Hemodynamics improved.  Can transfer to medical floor.   Assessment & Plan:   Principal Problem:   Stroke [facility]) Active  Problems:   Sinus tachycardia   Diabetes mellitus without complication (HCC)   Thyroid disease   Hypertension   CAD (coronary artery disease)   Pheochromocytoma   Depression   HLD (hyperlipidemia)   Nausea & vomiting   CVA (cerebral vascular accident) ([place])  Aphasia Bilateral lower extremity weakness Intermittent vomiting Concern for brainstem versus thalamic CVA CT negative Neurology following Aphasia waxing and waning Speech remains dysarthric MRI brain negative for acute infarct Plan: Blood pressure management, normotensive blood pressure goals TTE, pending Continue telemetry monitoring Frequent neurochecks Check A1c, pending fasting lipids, unremarkable Therapy and speech evaluations High intensity statin  aspirin 81 daily Ticagrelor   New lip swelling Soft tissue neck swelling Presentation concerning for allergic reaction Possibly attributed to iodine allergy Also possible ticlopidine CT soft tissue neck negative Maintaining airway Plan: As needed Benadryl Wean Solu-Medrol Continue H2 blocker for now Close monitoring respiratory status      Sinus tachycardia Patient heart rate was up to 160s.   Etiology is not clear.   Patient has history of thyroid disease, but she does not know which type of thyroid disease.  Patient is not taking medications.   TSH is 0.257, therefore may have contributed partially.   Patient has history of pheochromocytoma, but the patient is s/p of adrenal gland removal, less likely to play a role here. Plan: Continue metoprolol titrate 100 mg twice daily As needed IV metoprolol   Thyroid disease  TSH 0.257 Free T4 1.16, mild elevation   Diabetes mellitus without complication (HCC)  Patient is taking Levemir 14 units twice daily at home Plan: -SSI -Increase Semglee  to 20 units twice daily -Follow-up A1c, pending   Hypertension -- Blood pressure medications were held for 24 hours after admission Plan: Continue  amlodipine 10 mg daily Continue lisinopril 40 mg daily As needed IV hydralazine  metoprolol 100 twice daily  titrate blood pressure regimen as necessary   CAD (coronary artery disease)  S/p of CABG -Aspirin, Lipitor, Brilinta   Pheochromocytoma  S/p of right adrenal gland removal surgery -No acute issues   Depression -Continue home medications   HLD (hyperlipidemia) -Lipitor   Nausea & vomiting  Etiology is not clear.   Patient does not have abdominal pain.   CT scan did not show acute intra-abdominal issues. Plan: For liquid diet As needed antiemetics SLP evaluation    CT-abd/pelvis: Patient appears to be status post hysterectomy. 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  Aphasia   DVT prophylaxis: SQ heparin Code Status: Full Family Communication: daughter Lori [name] [phone] on 12/19 Disposition Plan: Status is: Inpatient  Remains inpatient appropriate because: Aphasic symptoms and waxing and waning.      Level of care: Telemetry Medical  Consultants:  Neurology  Procedures:  None  Antimicrobials: None   Subjective: Seen and examined.  A little more communicative today.  Speech remains dysarthric  Objective: Vitals:   01/22/21 0700 01/22/21 0800 01/22/21 0830 01/22/21 1004  BP: (!) 169/112 (!) 150/138 (!) 164/114 (!) 148/91  Pulse: (!) 36 84 (!) 112 (!) 120  Resp: 11 18 18 14   Temp: 99.1 F (37.3 C)   98.7 F (37.1 C)  TempSrc:    Oral  SpO2: (!) 82% (!) 77% 93% 99%  Weight:      Height:        Intake/Output Summary (Last 24 hours) at 01/22/2021 1012 Last data filed at 01/22/2021 0600 Gross per 24 hour  Intake 1120.37 ml  Output 1625 ml  Net -504.63 ml   Filed Weights   01/19/21 0838 01/20/21 0049  Weight: 102.6 kg 97.7 kg    Examination:  General exam: No acute distress Respiratory system: Lungs clear.  Normal work of breathing.  Room air Cardiovascular system:  Tachycardic, regular rhythm, no murmurs, no pedal edema Gastrointestinal system: Soft, NT/ND, normal bowel sounds Central nervous system: Alert, oriented x2, dysarthric speech Extremities: BLE weakness Skin: Lip swelling and soft tissue neck swollen, improving from prior Psychiatry: Judgement and insight appear normal. Mood & affect appropriate.     Data Reviewed: I have personally reviewed following labs and imaging studies  CBC: Recent Labs  Lab 01/19/21 0743 01/22/21 0850  WBC 8.0 17.4*  NEUTROABS 5.9 15.0*  HGB 13.0 13.4  HCT 39.2 40.2  MCV 79.8* 79.3*  PLT 366 595   Basic Metabolic Panel: Recent Labs  Lab 01/19/21 0743 01/19/21 0807 01/22/21 0850  NA 132*  --  139  K 3.9  --  3.9  CL 96*  --  107  CO2 24  --  24  GLUCOSE 336*  --  395*  BUN 12  --  26*  CREATININE 0.70 0.60 0.89  CALCIUM 9.1  --  8.7*  MG  --   --  2.2   GFR: Estimated Creatinine Clearance: 84.3 mL/min (by C-G formula based on SCr of 0.89 mg/dL). Liver Function Tests: Recent Labs  Lab 01/19/21 0743  AST 56*  ALT 61*  ALKPHOS 90  BILITOT 0.9  PROT 8.0  ALBUMIN 3.7   Recent Labs  Lab 01/20/21 0709  LIPASE 110*  No results for input(s): AMMONIA in the last 168 hours. Coagulation Profile: Recent Labs  Lab 01/19/21 0743  INR 1.0   Cardiac Enzymes: No results for input(s): CKTOTAL, CKMB, CKMBINDEX, TROPONINI in the last 168 hours. BNP (last 3 results) No results for input(s): PROBNP in the last 8760 hours. HbA1C: No results for input(s): HGBA1C in the last 72 hours. CBG: Recent Labs  Lab 01/21/21 1609 01/21/21 2115 01/21/21 2349 01/22/21 0722 01/22/21 1006  GLUCAP 366* 353* 360* 298* 356*   Lipid Profile: Recent Labs    01/20/21 0709  CHOL 165  HDL 54  LDLCALC 95  TRIG 81  CHOLHDL 3.1   Thyroid Function Tests: Recent Labs    01/20/21 0709  FREET4 1.16*   Anemia Panel: No results for input(s): VITAMINB12, FOLATE, FERRITIN, TIBC, IRON, RETICCTPCT in the  last 72 hours. Sepsis Labs: Recent Labs  Lab 01/19/21 0850 01/19/21 1002  LATICACIDVEN 2.0* 1.4    Recent Results (from the past 240 hour(s))  Blood Culture (routine x 2)     Status: None (Preliminary result)   Collection Time: 01/19/21  8:50 AM   Specimen: BLOOD  Result Value Ref Range Status   Specimen Description BLOOD BLOOD LEFT FOREARM  Final   Special Requests   Final    BOTTLES DRAWN AEROBIC AND ANAEROBIC Blood Culture results may not be optimal due to an inadequate volume of blood received in culture bottles   Culture   Final    NO GROWTH 3 DAYS Performed at [facility], [address] [place], [place] [address]    Report Status PENDING  Incomplete  Resp Panel by RT-PCR (Flu A&B, Covid) Nasopharyngeal Swab     Status: None   Collection Time: 01/19/21  9:44 AM   Specimen: Nasopharyngeal Swab; Nasopharyngeal(NP) swabs in vial transport medium  Result Value Ref Range Status   SARS Coronavirus 2 by RT PCR NEGATIVE NEGATIVE Final    Comment: (NOTE) SARS-CoV-2 target nucleic acids are NOT DETECTED.  The SARS-CoV-2 RNA is generally detectable in upper respiratory specimens during the acute phase of infection. The lowest concentration of SARS-CoV-2 viral copies this assay can detect is 138 copies/mL. A negative result does not preclude SARS-Cov-2 infection and should not be used as the sole basis for treatment or other patient management decisions. A negative result may occur with  improper specimen collection/handling, submission of specimen other than nasopharyngeal swab, presence of viral mutation(s) within the areas targeted by this assay, and inadequate number of viral copies(<138 copies/mL). A negative result must be combined with clinical observations, patient history, and epidemiological information. The expected result is Negative.  Fact Sheet for Patients:  EntrepreneurPulse.com.au  Fact Sheet for Healthcare Providers:   IncredibleEmployment.be  This test is no t yet approved or cleared by the [place] FDA and  has been authorized for detection and/or diagnosis of SARS-CoV-2 by FDA under an Emergency Use Authorization (EUA). This EUA will remain  in effect (meaning this test can be used) for the duration of the COVID-19 declaration under Section 564(b)(1) of the Act, 21 U.S.C.section 360bbb-3(b)(1), unless the authorization is terminated  or revoked sooner.       Influenza A by PCR NEGATIVE NEGATIVE Final   Influenza B by PCR NEGATIVE NEGATIVE Final    Comment: (NOTE) The Xpert Xpress SARS-CoV-2/FLU/RSV plus assay is intended as an aid in the diagnosis of influenza from Nasopharyngeal swab specimens and should not be used as a sole basis for treatment. Nasal washings and  aspirates are unacceptable for Xpert Xpress SARS-CoV-2/FLU/RSV testing.  Fact Sheet for Patients: EntrepreneurPulse.com.au  Fact Sheet for Healthcare Providers: IncredibleEmployment.be  This test is not yet approved or cleared by the [place] FDA and has been authorized for detection and/or diagnosis of SARS-CoV-2 by FDA under an Emergency Use Authorization (EUA). This EUA will remain in effect (meaning this test can be used) for the duration of the COVID-19 declaration under Section 564(b)(1) of the Act, 21 U.S.C. section 360bbb-3(b)(1), unless the authorization is terminated or revoked.  Performed at [facility], [address] [place], [place] [address]   Culture, blood (Routine X 2) w Reflex to ID Panel     Status: None (Preliminary result)   Collection Time: 01/19/21 10:02 AM   Specimen: BLOOD  Result Value Ref Range Status   Specimen Description BLOOD RIGHT FOA  Final   Special Requests   Final    BOTTLES DRAWN AEROBIC AND ANAEROBIC Blood Culture adequate volume   Culture   Final    NO GROWTH 3 DAYS Performed at [facility],  [address] [place], [place] [address]    Report Status PENDING  Incomplete         Radiology Studies: CT SOFT TISSUE NECK WO CONTRAST  Result Date: 01/21/2021 CLINICAL DATA:  Soft tissue swelling, infection suspected EXAM: CT NECK WITHOUT CONTRAST TECHNIQUE: Multidetector CT imaging of the neck was performed following the standard protocol without intravenous contrast. COMPARISON:  CTA head neck 01/19/2021 FINDINGS: Pharynx and larynx: The nasal cavity and nasopharynx are normal. The oropharynx and oral cavity are normal. The hypopharynx and larynx are normal. There is no abnormal soft tissue mass or fluid collection, within the confines of noncontrast technique. The parapharyngeal spaces are clear. The airway is patent. Salivary glands: Parotid and submandibular glands are normal. Thyroid: Unremarkable. Lymph nodes: There is no pathologic lymphadenopathy in the neck. Vascular: There is calcified atherosclerotic plaque in the bilateral carotid bulbs. Limited intracranial: The imaged portions of the intracranial compartment are unremarkable. Visualized orbits: The globes and orbits are unremarkable. Mastoids and visualized paranasal sinuses: Paranasal sinuses and mastoid air cells are clear. Skeleton: There is no significant degenerative change of the cervical spine. There is no acute osseous abnormality or aggressive osseous lesion. There is no visible canal hematoma Upper chest: . the imaged lung apices are clear. Other: None. IMPRESSION: Unremarkable noncontrast CT of the neck with no acute finding to explain the patient's symptoms. Electronically Signed   By: Valetta Mole M.D.   On: 01/21/2021 16:38   MR BRAIN WO CONTRAST  Result Date: 01/20/2021 CLINICAL DATA:  Left-sided weakness, stroke suspected EXAM: MRI HEAD WITHOUT CONTRAST TECHNIQUE: Multiplanar, multiecho pulse sequences of the brain and surrounding structures were obtained without intravenous contrast. COMPARISON:  CT/CTA head  and neck dated 1 day prior FINDINGS: Brain: There is no evidence of acute intracranial hemorrhage, extra-axial fluid collection, or acute infarct. There is a background of moderate parenchymal volume loss, advanced for age. There are remote infarcts in the bilateral basal ganglia, thalami, right cerebral peduncle, and periventricular white matter. There is a remote infarct in the left cerebellar hemisphere. Additional confluent FLAIR signal abnormality in the subcortical and periventricular white matter likely reflects sequela of chronic white matter microangiopathy, also advanced for age. There are multiple small chronic microhemorrhages in a central distribution, likely hypertensive in etiology. There is no solid mass lesion.  There is no midline shift. Vascular: Normal flow voids. Skull and upper cervical spine: Normal  marrow signal. Sinuses/Orbits: The paranasal sinuses are clear. The globes and orbits are unremarkable. Other: None. IMPRESSION: 1. No acute intracranial pathology. 2. Moderate parenchymal volume loss and chronic white matter microangiopathy, advanced for age. 3. 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 4. Multiple small chronic microhemorrhages in a central distribution, likely hypertensive in etiology. Electronically Signed   By: Valetta Mole M.D.   On: 01/20/2021 14:31   MR THORACIC SPINE WO CONTRAST  Result Date: 01/21/2021 CLINICAL DATA:  Demyelinating disease EXAM: MRI THORACIC SPINE WITHOUT CONTRAST TECHNIQUE: Multiplanar, multisequence MR imaging of the thoracic spine was performed. No intravenous contrast was administered. COMPARISON:  None. FINDINGS: Evaluation is somewhat limited by motion artifact. Alignment:  Physiologic. Vertebrae: No fracture, evidence of discitis, or bone lesion. Cord:  Normal signal and morphology. Paraspinal and other soft tissues: Small right pleural effusion. Disc levels: T6-T7:  Small central disc protrusion. No spinal canal stenosis or neural foraminal narrowing. T11-T12: Small left paracentral disc protrusion. No spinal canal stenosis or neural foraminal narrowing. Other thoracic vertebral levels are negative. IMPRESSION: 1. No evidence of demyelinating disease in the thoracic spinal cord. 2. No spinal canal stenosis or neural foraminal narrowing. Electronically Signed   By: Merilyn Baba M.D.   On: 01/21/2021 22:22   ECHOCARDIOGRAM COMPLETE  Result Date: 01/21/2021    ECHOCARDIOGRAM REPORT   Patient Name:   CISSY GALBREATH Date of Exam: 01/21/2021 Medical Rec #:  [id number]           Height:       62.0 in Accession #:    [id number]          Weight:       215.4 lb Date of Birth:  Feb 27, 1970            BSA:          1.973 m Patient Age:    37 years            BP:           124/89 mmHg Patient Gender: F                   HR:           107 bpm. Exam Location:  [facility] Procedure: 2D Echo Indications:     Stroke I63.9  History:         Patient has no prior history of Echocardiogram examinations.  Sonographer:     Kathlen Brunswick RDCS Referring Phys:  [id number] Soledad Gerlach NIU Diagnosing Phys: Ida Rogue MD  Sonographer Comments: Technically challenging study due to limited acoustic windows, suboptimal subcostal window and suboptimal apical window. Image acquisition challenging due to patient body habitus. IMPRESSIONS  1. Left ventricular ejection fraction, by estimation, is 60 to 65%. The left ventricle has normal function. The left ventricle has no regional wall motion abnormalities. There is moderate left ventricular hypertrophy. Left ventricular diastolic parameters are consistent with Grade I diastolic dysfunction (impaired relaxation).  2. Right ventricular systolic function is normal. The right ventricular size is normal.  3. The mitral valve is normal in structure. No evidence of mitral valve regurgitation. No evidence of mitral stenosis.  4. Tricuspid valve regurgitation is mild to  moderate.  5. The aortic valve is normal in structure. Aortic valve regurgitation is not visualized. Aortic valve sclerosis is present, with no evidence of aortic valve stenosis.  6. The inferior vena cava is normal in size with greater  than 50% respiratory variability, suggesting right atrial pressure of 3 mmHg. FINDINGS  Left Ventricle: Left ventricular ejection fraction, by estimation, is 60 to 65%. The left ventricle has normal function. The left ventricle has no regional wall motion abnormalities. The left ventricular internal cavity size was normal in size. There is  moderate left ventricular hypertrophy. Left ventricular diastolic parameters are consistent with Grade I diastolic dysfunction (impaired relaxation). Right Ventricle: The right ventricular size is normal. No increase in right ventricular wall thickness. Right ventricular systolic function is normal. Left Atrium: Left atrial size was normal in size. Right Atrium: Right atrial size was normal in size. Pericardium: There is no evidence of pericardial effusion. Mitral Valve: The mitral valve is normal in structure. No evidence of mitral valve regurgitation. No evidence of mitral valve stenosis. Tricuspid Valve: The tricuspid valve is normal in structure. Tricuspid valve regurgitation is mild to moderate. No evidence of tricuspid stenosis. Aortic Valve: The aortic valve is normal in structure. Aortic valve regurgitation is not visualized. Aortic valve sclerosis is present, with no evidence of aortic valve stenosis. Aortic valve peak gradient measures 8.0 mmHg. Pulmonic Valve: The pulmonic valve was normal in structure. Pulmonic valve regurgitation is not visualized. No evidence of pulmonic stenosis. Aorta: The aortic root is normal in size and structure. Venous: The inferior vena cava is normal in size with greater than 50% respiratory variability, suggesting right atrial pressure of 3 mmHg. IAS/Shunts: No atrial level shunt detected by color flow  Doppler.  LEFT VENTRICLE PLAX 2D LVIDd:         3.00 cm   Diastology LVIDs:         2.10 cm   LV e' medial:    6.20 cm/s LV PW:         1.50 cm   LV E/e' medial:  14.3 LV IVS:        1.30 cm   LV e' lateral:   5.98 cm/s LVOT diam:     1.80 cm   LV E/e' lateral: 14.8 LV SV:         42 LV SV Index:   22 LVOT Area:     2.54 cm  LEFT ATRIUM         Index LA diam:    3.10 cm 1.57 cm/m  AORTIC VALVE                 PULMONIC VALVE AV Area (Vmax): 1.53 cm     PV Vmax:       1.18 m/s AV Vmax:        141.00 cm/s  PV Peak grad:  5.6 mmHg AV Peak Grad:   8.0 mmHg LVOT Vmax:      84.90 cm/s LVOT Vmean:     60.700 cm/s LVOT VTI:       0.167 m  AORTA Ao Root diam: 2.50 cm Ao Asc diam:  2.30 cm MITRAL VALVE                TRICUSPID VALVE MV Area (PHT): 6.43 cm     TV Peak grad:   19.2 mmHg MV Decel Time: 118 msec     TV Vmax:        2.19 m/s MV E velocity: 88.70 cm/s MV A velocity: 105.00 cm/s  SHUNTS MV E/A ratio:  0.84         Systemic VTI:  0.17 m  Systemic Diam: 1.80 cm Ida Rogue MD Electronically signed by Ida Rogue MD Signature Date/Time: 01/21/2021/7:03:01 PM    Final         Scheduled Meds:   stroke: mapping our early stages of recovery book   Does not apply Once   amLODipine  10 mg Oral Daily   aspirin  81 mg Oral Daily   atorvastatin  80 mg Oral Daily   chlorhexidine  15 mL Mouth Rinse BID   Chlorhexidine Gluconate Cloth  6 each Topical Daily   cyclobenzaprine  5 mg Oral TID   diphenhydrAMINE  25 mg Intravenous Q8H   docusate sodium  200 mg Oral QHS   escitalopram  20 mg Oral Daily   gabapentin  200 mg Oral TID   heparin  5,000 Units Subcutaneous Q8H   hydrALAZINE  25 mg Oral Q8H   insulin aspart  0-5 Units Subcutaneous QHS   insulin aspart  0-9 Units Subcutaneous TID WC   insulin glargine-yfgn  20 Units Subcutaneous BID   lisinopril  40 mg Oral Daily   loratadine  10 mg Oral Daily   LORazepam  0.25 mg Oral QHS   mouth rinse  15 mL Mouth Rinse q12n4p    melatonin  10 mg Oral QHS   methylPREDNISolone (SOLU-MEDROL) injection  40 mg Intravenous Daily   metoCLOPramide (REGLAN) injection  10 mg Intravenous Q8H   metoprolol tartrate  100 mg Oral BID   pantoprazole (PROTONIX) IV  40 mg Intravenous Q12H   sodium chloride flush  3 mL Intravenous Once   ticagrelor  90 mg Oral BID   Continuous Infusions:  famotidine (PEPCID) IV Stopped (01/21/21 2328)     LOS: 3 days    Time spent: 35 minutes    Sidney Ace, MD [facility]   If 7PM-7AM, please contact night-coverage  01/22/2021, 10:12 AM

## 2021-01-23 ENCOUNTER — Inpatient Hospital Stay: Payer: Medicare Other

## 2021-01-23 DIAGNOSIS — I639 Cerebral infarction, unspecified: Secondary | ICD-10-CM | POA: Diagnosis not present

## 2021-01-23 DIAGNOSIS — I10 Essential (primary) hypertension: Secondary | ICD-10-CM

## 2021-01-23 DIAGNOSIS — R4701 Aphasia: Secondary | ICD-10-CM | POA: Diagnosis not present

## 2021-01-23 DIAGNOSIS — R4689 Other symptoms and signs involving appearance and behavior: Secondary | ICD-10-CM | POA: Diagnosis not present

## 2021-01-23 DIAGNOSIS — R220 Localized swelling, mass and lump, head: Secondary | ICD-10-CM

## 2021-01-23 DIAGNOSIS — R531 Weakness: Secondary | ICD-10-CM

## 2021-01-23 DIAGNOSIS — Z8673 Personal history of transient ischemic attack (TIA), and cerebral infarction without residual deficits: Secondary | ICD-10-CM

## 2021-01-23 DIAGNOSIS — R221 Localized swelling, mass and lump, neck: Secondary | ICD-10-CM

## 2021-01-23 DIAGNOSIS — R112 Nausea with vomiting, unspecified: Secondary | ICD-10-CM | POA: Diagnosis not present

## 2021-01-23 LAB — GLUCOSE, CAPILLARY
Glucose-Capillary: 207 mg/dL — ABNORMAL HIGH (ref 70–99)
Glucose-Capillary: 318 mg/dL — ABNORMAL HIGH (ref 70–99)
Glucose-Capillary: 350 mg/dL — ABNORMAL HIGH (ref 70–99)
Glucose-Capillary: 356 mg/dL — ABNORMAL HIGH (ref 70–99)

## 2021-01-23 MED ORDER — INSULIN GLARGINE-YFGN 100 UNIT/ML ~~LOC~~ SOLN
23.0000 [IU] | Freq: Two times a day (BID) | SUBCUTANEOUS | Status: DC
  Administered 2021-01-23 – 2021-01-25 (×5): 23 [IU] via SUBCUTANEOUS
  Filled 2021-01-23 (×7): qty 0.23

## 2021-01-23 NOTE — Evaluation (Signed)
Objective Swallowing Evaluation: Type of Study: MBS-Modified Barium Swallow Study   Patient Details  Name: Lori [name] MRN: [id number] Date of Birth: 1970/03/13  Today's Date: 01/23/2021 Time: SLP Start Time (ACUTE ONLY): 2 -SLP Stop Time (ACUTE ONLY): 75  SLP Time Calculation (min) (ACUTE ONLY): 60 min   Past Medical History:  Past Medical History:  Diagnosis Date   CAD (coronary artery disease)    Depression    Diabetes mellitus without complication ([place])    Hypertension    Pheochromocytoma    Pheochromocytoma    Stroke [facility])    Thyroid disease    Past Surgical History:  Past Surgical History:  Procedure Laterality Date   CORONARY ARTERY BYPASS GRAFT     Right adrenal gland removal for pheochromocytoma Right    HPI: Per Hospitalist note, "50 y.o. female with medical history significant of hypertension, hyperlipidemia, diabetes mellitus, stroke with left-sided weakness, thyroid disease (patient does not know which type of thyroid disease), depression with anxiety, CAD, s/p of CABG, pheochromocytoma (s/p of right adrenal gland removal per patient), who presents with aphasia, nausea and vomiting.  Patient has history of stroke with left-sided weakness.  At her normal baseline, patient is able to perform ADLs, typically independently with assistance of a walker.  Patient was last known normal at 10 PM last night 01/18/21. Today she started having difficult speaking at about 6 AM.  She also reports bilateral leg weakness, cannot walk due to weakness.  Patient denies chest pain, cough, shortness of breath.  No fever or chills.  No symptoms of UTI.  She states that she has nausea, and has vomited at least 8 times with bilious and greenish colored vomitus.  Denies diarrhea or abdominal pain.  Patient was found to have sinus tachycardia with heart rate up to 160s, patient was given IV metoprolol in the ED.  Heart rate improved to 120s. Heart rate has improved however patient  remains tachycardic and hypertensive.  She is mentating clearly but does have significant soft tissue neck swelling.  Allergic reaction but unknown trigger.  Patient attributes it to known iodine contrast allergy.  Maintaining airway with clear lungs.".      Per Neurology report of EEG: "Impression and clinical correlation: This EEG was obtained while awake and asleep and is abnormal due to mild diffuse slowing indicative of global cerebral dysfunction. Subtle left frontal focal slowing indicated superimposed focal dysfunction in that area.".   CXR at admit: "No active disease.".   Subjective: Pt alert, pleasant, and cooperative. Dysarthria noted c/b hoarse/hypophonic vocal quality w/ imprecise articulation, and hypernasility. Lips are notably swollen. Per RN, pt with "allergic reaction to contrast.".    Recommendations for follow up therapy are one component of a multi-disciplinary discharge planning process, led by the attending physician.  Recommendations may be updated based on patient status, additional functional criteria and insurance authorization.  Assessment / Plan / Recommendation  Clinical Impressions 01/23/2021  Clinical Impression Pt appears to present w/ MOD oropharyngeal phase dysphagia w/ risk for aspiration and negative sequelae from such including aspiration pneumonia. Pt's overall medical presentation w/ generalized motor weakness, as well as her dysphagia, could impact her ability to effectively meet her nutrition/hydration needs adequately. With regard to her declined Vocal Quality and hypophonia, an ENT consult would be appropriate for full assessment.  During the Pharyngeal phase, pt exhibited MOD+ delayed pharyngeal swallow initiation w/ all consistencies(unsure if impact from previous R CVA). This resulted in a decrease in timely and effective airway  closure(epiglottic inversion) w/ Aspiration of thin liquids noted fairly consistently. The Aspiration was initially SILENT w/ a  much delaed cough response. Trace+ laryngeal Penetration of Nectar consistnecy liquids noted x1 w/ trace residue material along underneath side of the epiglottis remaining post initial swallow - No Aspiration of Nectar liquids occurred during this study. Reduced laryngeal excursion and anterior movement noted w/ min valleculae residue remaining post initial swallow w/ all consistencies; BOT residue also noted. During the Oral phase, decreased bolus cohesion and premature spillage w/ thin and Nectar liquids; weak lingual manipulation and decreased tongue to palate/pharyngeal wall contact noted reuslting in BOT/lingual residue. The Min lingual residue seeped into the pharynx post swallow, however, w/ a lingual sweep and f/u DRY swallow, the lingual residue was reduced/cleared. Similar bolus management was noted w/ increased texture trials of puree and softened solid w/ much increased Time needed for A-P transfer and mastication of the softened solid. During the Esophageal phase, min bolus stasis was seen w/in the Cervical Esophagus w/ All consistencies assessed; a slight-min amount of Retrograde backflow of thin liquid material/trial was noted x2 but did not pass through the UES into the pharynx.   SLP Visit Diagnosis Dysphagia, oropharyngeal phase (R13.12)  Attention and concentration deficit following --  Frontal lobe and executive function deficit following --  Impact on safety and function Moderate aspiration risk;Risk for inadequate nutrition/hydration      Treatment Recommendations 01/23/2021  Treatment Recommendations Therapy as outlined in treatment plan below     Prognosis 01/23/2021  Prognosis for Safe Diet Advancement Fair  Barriers to Reach Goals Time post onset;Severity of deficits  Barriers/Prognosis Comment declined Neuro status per EEG    Diet Recommendations 01/23/2021  SLP Diet Recommendations Dysphagia 1 (Puree) solids;Nectar thick liquid  Liquid Administration via Straw   Medication Administration Crushed with puree  Compensations Minimize environmental distractions;Slow rate;Small sips/bites;Lingual sweep for clearance of pocketing;Multiple dry swallows after each bite/sip;Follow solids with liquid;Clear throat intermittently  Postural Changes Remain semi-upright after after feeds/meals (Comment);Seated upright at 90 degrees      Other Recommendations 01/23/2021  Recommended Consults Consider ENT evaluation  Oral Care Recommendations Oral care BID;Oral care before and after PO;Staff/trained caregiver to provide oral care  Other Recommendations Order thickener from pharmacy;Prohibited food (jello, ice cream, thin soups);Remove water pitcher;Have oral suction available  Follow Up Recommendations Skilled nursing-short term rehab (<3 hours/day)  Assistance recommended at discharge Frequent or constant Supervision/Assistance  Functional Status Assessment Patient has had a recent decline in their functional status and demonstrates the ability to make significant improvements in function in a reasonable and predictable amount of time.    Frequency and Duration  01/23/2021  Speech Therapy Frequency (ACUTE ONLY) min 2x/week  Treatment Duration 2 weeks      Oral Phase 01/23/2021  Oral Phase Impaired  Oral - Pudding Teaspoon --  Oral - Pudding Cup --  Oral - Honey Teaspoon --  Oral - Honey Cup --  Oral - Nectar Teaspoon Decreased bolus cohesion;Premature spillage;Weak lingual manipulation;Incomplete tongue to palate contact;Lingual/palatal residue  Oral - Nectar Cup --  Oral - Nectar Straw Weak lingual manipulation;Incomplete tongue to palate contact;Reduced posterior propulsion;Lingual/palatal residue;Decreased bolus cohesion;Premature spillage  Oral - Thin Teaspoon NT  Oral - Thin Cup --  Oral - Thin Straw --  Oral - Puree Weak lingual manipulation;Incomplete tongue to palate contact;Reduced posterior propulsion;Lingual/palatal residue;Premature  spillage;Delayed oral transit  Oral - Mech Soft Weak lingual manipulation;Incomplete tongue to palate contact;Reduced posterior propulsion;Lingual/palatal residue;Delayed oral transit;Premature spillage;Impaired mastication  Oral - Regular NT  Oral - Multi-Consistency NT  Oral - Pill NT  Oral Phase - Comment oral phase c/b decreased bolus cohesion and premature spillage w/ thin and Nectar liquids; weak lingual manipulation and decreased tongue to palate/pharyngeal wall contact noted reuslting in BOT/lingual residue. The Min lingual residue seeped into the pharynx post swallow, however, w/ a lingual sweep and f/u DRY swallow, the lingual residue was reduced/cleared. Similar bolus management was noted w/ increased texture trials of puree and softened solid w/ much increased Time needed for A-P transfer and mastication of the softened solid.    Pharyngeal Phase 01/23/2021  Pharyngeal Phase Impaired  Pharyngeal- Pudding Teaspoon --  Pharyngeal --  Pharyngeal- Pudding Cup --  Pharyngeal --  Pharyngeal- Honey Teaspoon --  Pharyngeal --  Pharyngeal- Honey Cup --  Pharyngeal --  Pharyngeal- Nectar Teaspoon Delayed swallow initiation-pyriform sinuses;Reduced anterior laryngeal mobility;Reduced laryngeal elevation;Pharyngeal residue - valleculae;Compensatory strategies attempted (with notebox)  Pharyngeal --  Pharyngeal- Nectar Cup --  Pharyngeal --  Pharyngeal- Nectar Straw Delayed swallow initiation-pyriform sinuses;Reduced anterior laryngeal mobility;Reduced laryngeal elevation;Reduced airway/laryngeal closure;Penetration/Aspiration during swallow;Pharyngeal residue - valleculae;Compensatory strategies attempted (with notebox)  Pharyngeal Material enters airway, remains ABOVE vocal cords and not ejected out  Pharyngeal- Thin Teaspoon Delayed swallow initiation-pyriform sinuses;Reduced anterior laryngeal mobility;Reduced laryngeal elevation;Penetration/Aspiration during swallow;Pharyngeal residue -  valleculae;Trace aspiration;Reduced airway/laryngeal closure  Pharyngeal Material enters airway, CONTACTS cords and not ejected out  Pharyngeal- Thin Cup --  Pharyngeal --  Pharyngeal- Thin Straw Delayed swallow initiation-pyriform sinuses;Reduced laryngeal elevation;Reduced anterior laryngeal mobility;Reduced airway/laryngeal closure;Penetration/Aspiration during swallow;Pharyngeal residue - valleculae;Moderate aspiration  Pharyngeal Material enters airway, passes BELOW cords and not ejected out despite cough attempt by patient  Pharyngeal- Puree Delayed swallow initiation-vallecula;Reduced anterior laryngeal mobility;Reduced laryngeal elevation;Pharyngeal residue - valleculae;Compensatory strategies attempted (with notebox)  Pharyngeal --  Pharyngeal- Mechanical Soft Delayed swallow initiation-vallecula;Reduced anterior laryngeal mobility;Reduced laryngeal elevation;Reduced airway/laryngeal closure;Pharyngeal residue - valleculae;Compensatory strategies attempted (with notebox)  Pharyngeal --  Pharyngeal- Regular NT  Pharyngeal --  Pharyngeal- Multi-consistency NT  Pharyngeal --  Pharyngeal- Pill NT  Pharyngeal --  Pharyngeal Comment --     Cervical Esophageal Phase  01/23/2021  Cervical Esophageal Phase Impaired  Pudding Teaspoon --  Pudding Cup --  Honey Teaspoon --  Honey Cup --  Nectar Teaspoon --  Nectar Cup --  Nectar Straw --  Thin Teaspoon --  Thin Cup --  Thin Straw --  Puree --  Mechanical Soft --  Regular --  Multi-consistency --  Pill --  Cervical Esophageal Comment bolus stasis seen w/in the Cervical Esophagus w/ All consistencies assessed; a slight-min amount of Retrograde backflow of thin liquid material/trial was noted x2            Orinda Kenner, MS, CCC-SLP Speech Language Pathologist Rehab [facility] [phone] [facility] 01/23/2021, 4:02 PM

## 2021-01-23 NOTE — Progress Notes (Signed)
PROGRESS NOTE    Lori [name]  ZTI:[id number] DOB: [birth year] DOA: 01/19/2021 PCP: Pcp, No    Brief Narrative:  50 y.o. female with medical history significant of hypertension, hyperlipidemia, diabetes mellitus, stroke with left-sided weakness, thyroid disease (patient does not know which type of thyroid disease), depression with anxiety, CAD, s/p of CABG, pheochromocytoma (s/p of right adrenal gland removal per patient), who presents with aphasia, nausea and vomiting.   Patient has history of stroke with left-sided weakness.  At her normal baseline, patient is able to perform ADLs, typically independently with assistance of a walker.  Patient was last known normal at 10 PM last night 01/18/21. Today she started having difficult speaking at about 6 AM.  She also reports bilateral leg weakness, cannot walk due to weakness.  Patient denies chest pain, cough, shortness of breath.  No fever or chills.  No symptoms of UTI.  She states that she has nausea, and has vomited at least 8 times with bilious and greenish colored vomitus.  Denies diarrhea or abdominal pain.   Patient was found to have sinus tachycardia with heart rate up to 160s, patient was given IV metoprolol in the ED.  Heart rate improved to 120s.  Heart rate has improved however patient remains tachycardic and hypertensive.  She is mentating clearly but does have significant soft tissue neck swelling.  Allergic reaction but unknown trigger.  Patient attributes it to known iodine contrast allergy.  Maintaining airway with clear lungs.  12/19: Last night called by bedside RN as patient had episode of transient unresponsiveness lasting approximately 5 minutes.  Recovered spontaneously.  This morning patient will weak voice but seems to be mentating clearly.  She is somewhat disoriented to time and place.  Hemodynamics improved.  Can transfer to medical floor.   Assessment & Plan:   Principal Problem:   Stroke [facility]) Active  Problems:   Sinus tachycardia   Diabetes mellitus without complication (HCC)   Thyroid disease   Hypertension   CAD (coronary artery disease)   Pheochromocytoma   Depression   HLD (hyperlipidemia)   Nausea & vomiting   CVA (cerebral vascular accident) ([place])   Aphasia   Spell of abnormal behavior  Aphasia Bilateral lower extremity weakness Intermittent vomiting Concern for brainstem versus thalamic CVA CT negative Neurology following Aphasia waxing and waning Speech remains dysarthric MRI brain negative for acute infarct Plan: Blood pressure management, normotensive blood pressure goals TTE, pending Continue telemetry monitoring Frequent neurochecks Check A1c, pending fasting lipids, unremarkable Therapy and speech evaluations High intensity statin  aspirin 81 daily Ticagrelor   New lip swelling Soft tissue neck swelling Presentation concerning for allergic reaction Possibly attributed to iodine allergy Also possible ticlopidine CT soft tissue neck negative Maintaining airway Plan: As needed Benadryl Wean Solu-Medrol Continue H2 blocker for now Close monitoring respiratory status      Sinus tachycardia Patient heart rate was up to 160s.   Etiology is not clear.   Patient has history of thyroid disease, but she does not know which type of thyroid disease.  Patient is not taking medications.   TSH is 0.257, therefore may have contributed partially.   Patient has history of pheochromocytoma, but the patient is s/p of adrenal gland removal, less likely to play a role here. Plan: Continue metoprolol titrate 100 mg twice daily As needed IV metoprolol   Thyroid disease  TSH 0.257 Free T4 1.16, mild elevation   Diabetes mellitus without complication (HCC)  Patient is taking Levemir 14  units twice daily at home Plan: -SSI -Increase Semglee to 20 units twice daily -Follow-up A1c, pending   Hypertension -- Blood pressure medications were held for 24 hours  after admission Plan: Continue amlodipine 10 mg daily Continue lisinopril 40 mg daily As needed IV hydralazine  metoprolol 100 twice daily  titrate blood pressure regimen as necessary   CAD (coronary artery disease)  S/p of CABG -Aspirin, Lipitor, Brilinta   Pheochromocytoma  S/p of right adrenal gland removal surgery -No acute issues   Depression -Continue home medications   HLD (hyperlipidemia) -Lipitor   Nausea & vomiting  Etiology is not clear.   Patient does not have abdominal pain.   CT scan did not show acute intra-abdominal issues. Plan: For liquid diet As needed antiemetics SLP evaluation    CT-abd/pelvis: Patient appears to be status post hysterectomy. 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  Aphasia   DVT prophylaxis: SQ heparin Code Status: Full Family Communication: daughter [name] [phone] on 12/19 Disposition Plan: Status is: Inpatient  Remains inpatient appropriate because: Aphasic symptoms and waxing and waning.      Level of care: Telemetry Medical  Consultants:  Neurology  Procedures:  None  Antimicrobials: None   Subjective: Seen and examined.  A little more communicative today.  Speech remains dysarthric  Objective: Vitals:   01/22/21 2103 01/23/21 0038 01/23/21 0456 01/23/21 0735  BP: (!) 149/102 96/73 (!) 109/51 127/87  Pulse: (!) 105 92 100 (!) 108  Resp: 19 19 17 16   Temp: 98.3 F (36.8 C) 98.4 F (36.9 C) 97.8 F (36.6 C) 98.5 F (36.9 C)  TempSrc:    Oral  SpO2: 100% 97% 99% 98%  Weight:      Height:        Intake/Output Summary (Last 24 hours) at 01/23/2021 1044 Last data filed at 01/23/2021 0038 Gross per 24 hour  Intake 240 ml  Output 1000 ml  Net -760 ml   Filed Weights   01/19/21 0838 01/20/21 0049  Weight: 102.6 kg 97.7 kg    Examination:  General exam: No acute distress Respiratory system: Lungs clear.  Normal work of breathing.   Room air Cardiovascular system: Tachycardic, regular rhythm, no murmurs, no pedal edema Gastrointestinal system: Soft, NT/ND, normal bowel sounds Central nervous system: Alert, oriented x2, dysarthric speech Extremities: BLE weakness Skin: Lip swelling and soft tissue neck swollen, improving from prior Psychiatry: Judgement and insight appear normal. Mood & affect appropriate.     Data Reviewed: I have personally reviewed following labs and imaging studies  CBC: Recent Labs  Lab 01/19/21 0743 01/22/21 0850  WBC 8.0 17.4*  NEUTROABS 5.9 15.0*  HGB 13.0 13.4  HCT 39.2 40.2  MCV 79.8* 79.3*  PLT 366 782   Basic Metabolic Panel: Recent Labs  Lab 01/19/21 0743 01/19/21 0807 01/22/21 0850  NA 132*  --  139  K 3.9  --  3.9  CL 96*  --  107  CO2 24  --  24  GLUCOSE 336*  --  395*  BUN 12  --  26*  CREATININE 0.70 0.60 0.89  CALCIUM 9.1  --  8.7*  MG  --   --  2.2   GFR: Estimated Creatinine Clearance: 84.3 mL/min (by C-G formula based on SCr of 0.89 mg/dL). Liver Function Tests: Recent Labs  Lab 01/19/21 0743  AST 56*  ALT 61*  ALKPHOS 90  BILITOT 0.9  PROT 8.0  ALBUMIN 3.7  Recent Labs  Lab 01/20/21 0709  LIPASE 110*   No results for input(s): AMMONIA in the last 168 hours. Coagulation Profile: Recent Labs  Lab 01/19/21 0743  INR 1.0   Cardiac Enzymes: No results for input(s): CKTOTAL, CKMB, CKMBINDEX, TROPONINI in the last 168 hours. BNP (last 3 results) No results for input(s): PROBNP in the last 8760 hours. HbA1C: No results for input(s): HGBA1C in the last 72 hours. CBG: Recent Labs  Lab 01/22/21 1006 01/22/21 1320 01/22/21 1629 01/22/21 2207 01/23/21 0735  GLUCAP 356* 266* 233* 371* 207*   Lipid Profile: No results for input(s): CHOL, HDL, LDLCALC, TRIG, CHOLHDL, LDLDIRECT in the last 72 hours.  Thyroid Function Tests: No results for input(s): TSH, T4TOTAL, FREET4, T3FREE, THYROIDAB in the last 72 hours.  Anemia Panel: No results  for input(s): VITAMINB12, FOLATE, FERRITIN, TIBC, IRON, RETICCTPCT in the last 72 hours. Sepsis Labs: Recent Labs  Lab 01/19/21 0850 01/19/21 1002  LATICACIDVEN 2.0* 1.4    Recent Results (from the past 240 hour(s))  Blood Culture (routine x 2)     Status: None (Preliminary result)   Collection Time: 01/19/21  8:50 AM   Specimen: BLOOD  Result Value Ref Range Status   Specimen Description BLOOD BLOOD LEFT FOREARM  Final   Special Requests   Final    BOTTLES DRAWN AEROBIC AND ANAEROBIC Blood Culture results may not be optimal due to an inadequate volume of blood received in culture bottles   Culture   Final    NO GROWTH 4 DAYS Performed at [facility], [address], [place], [place] [address]    Report Status PENDING  Incomplete  Resp Panel by RT-PCR (Flu A&B, Covid) Nasopharyngeal Swab     Status: None   Collection Time: 01/19/21  9:44 AM   Specimen: Nasopharyngeal Swab; Nasopharyngeal(NP) swabs in vial transport medium  Result Value Ref Range Status   SARS Coronavirus 2 by RT PCR NEGATIVE NEGATIVE Final    Comment: (NOTE) SARS-CoV-2 target nucleic acids are NOT DETECTED.  The SARS-CoV-2 RNA is generally detectable in upper respiratory specimens during the acute phase of infection. The lowest concentration of SARS-CoV-2 viral copies this assay can detect is 138 copies/mL. A negative result does not preclude SARS-Cov-2 infection and should not be used as the sole basis for treatment or other patient management decisions. A negative result may occur with  improper specimen collection/handling, submission of specimen other than nasopharyngeal swab, presence of viral mutation(s) within the areas targeted by this assay, and inadequate number of viral copies(<138 copies/mL). A negative result must be combined with clinical observations, patient history, and epidemiological information. The expected result is Negative.  Fact Sheet for Patients:   EntrepreneurPulse.com.au  Fact Sheet for Healthcare Providers:  IncredibleEmployment.be  This test is no t yet approved or cleared by the [place] FDA and  has been authorized for detection and/or diagnosis of SARS-CoV-2 by FDA under an Emergency Use Authorization (EUA). This EUA will remain  in effect (meaning this test can be used) for the duration of the COVID-19 declaration under Section 564(b)(1) of the Act, 21 U.S.C.section 360bbb-3(b)(1), unless the authorization is terminated  or revoked sooner.       Influenza A by PCR NEGATIVE NEGATIVE Final   Influenza B by PCR NEGATIVE NEGATIVE Final    Comment: (NOTE) The Xpert Xpress SARS-CoV-2/FLU/RSV plus assay is intended as an aid in the diagnosis of influenza from Nasopharyngeal swab specimens and should not be used as a sole  basis for treatment. Nasal washings and aspirates are unacceptable for Xpert Xpress SARS-CoV-2/FLU/RSV testing.  Fact Sheet for Patients: EntrepreneurPulse.com.au  Fact Sheet for Healthcare Providers: IncredibleEmployment.be  This test is not yet approved or cleared by the [place] FDA and has been authorized for detection and/or diagnosis of SARS-CoV-2 by FDA under an Emergency Use Authorization (EUA). This EUA will remain in effect (meaning this test can be used) for the duration of the COVID-19 declaration under Section 564(b)(1) of the Act, 21 U.S.C. section 360bbb-3(b)(1), unless the authorization is terminated or revoked.  Performed at [facility], [address] [place], [place] [address]   Culture, blood (Routine X 2) w Reflex to ID Panel     Status: None (Preliminary result)   Collection Time: 01/19/21 10:02 AM   Specimen: BLOOD  Result Value Ref Range Status   Specimen Description BLOOD RIGHT FOA  Final   Special Requests   Final    BOTTLES DRAWN AEROBIC AND ANAEROBIC Blood Culture adequate  volume   Culture   Final    NO GROWTH 4 DAYS Performed at [facility], [address], [place], [place] [address]    Report Status PENDING  Incomplete         Radiology Studies: CT SOFT TISSUE NECK WO CONTRAST  Result Date: 01/21/2021 CLINICAL DATA:  Soft tissue swelling, infection suspected EXAM: CT NECK WITHOUT CONTRAST TECHNIQUE: Multidetector CT imaging of the neck was performed following the standard protocol without intravenous contrast. COMPARISON:  CTA head neck 01/19/2021 FINDINGS: Pharynx and larynx: The nasal cavity and nasopharynx are normal. The oropharynx and oral cavity are normal. The hypopharynx and larynx are normal. There is no abnormal soft tissue mass or fluid collection, within the confines of noncontrast technique. The parapharyngeal spaces are clear. The airway is patent. Salivary glands: Parotid and submandibular glands are normal. Thyroid: Unremarkable. Lymph nodes: There is no pathologic lymphadenopathy in the neck. Vascular: There is calcified atherosclerotic plaque in the bilateral carotid bulbs. Limited intracranial: The imaged portions of the intracranial compartment are unremarkable. Visualized orbits: The globes and orbits are unremarkable. Mastoids and visualized paranasal sinuses: Paranasal sinuses and mastoid air cells are clear. Skeleton: There is no significant degenerative change of the cervical spine. There is no acute osseous abnormality or aggressive osseous lesion. There is no visible canal hematoma Upper chest: . the imaged lung apices are clear. Other: None. IMPRESSION: Unremarkable noncontrast CT of the neck with no acute finding to explain the patient's symptoms. Electronically Signed   By: Valetta Mole M.D.   On: 01/21/2021 16:38   MR THORACIC SPINE WO CONTRAST  Result Date: 01/21/2021 CLINICAL DATA:  Demyelinating disease EXAM: MRI THORACIC SPINE WITHOUT CONTRAST TECHNIQUE: Multiplanar, multisequence MR imaging of the thoracic spine was  performed. No intravenous contrast was administered. COMPARISON:  None. FINDINGS: Evaluation is somewhat limited by motion artifact. Alignment:  Physiologic. Vertebrae: No fracture, evidence of discitis, or bone lesion. Cord:  Normal signal and morphology. Paraspinal and other soft tissues: Small right pleural effusion. Disc levels: T6-T7: Small central disc protrusion. No spinal canal stenosis or neural foraminal narrowing. T11-T12: Small left paracentral disc protrusion. No spinal canal stenosis or neural foraminal narrowing. Other thoracic vertebral levels are negative. IMPRESSION: 1. No evidence of demyelinating disease in the thoracic spinal cord. 2. No spinal canal stenosis or neural foraminal narrowing. Electronically Signed   By: Merilyn Baba M.D.   On: 01/21/2021 22:22   EEG adult  Result Date: 01/22/2021 Derek Jack,  MD     01/22/2021  9:15 PM Routine EEG Report Rowan Pollman [name] is a 49 y.o. female with a history of strokes and aphasia who is undergoing an EEG to evaluate for seizures. Report: This EEG was acquired with electrodes placed according to the International 10-20 electrode system (including Fp1, Fp2, F3, F4, C3, C4, P3, P4, O1, O2, T3, T4, T5, T6, A1, A2, Fz, Cz, Pz). The following electrodes were missing or displaced: none. The occipital dominant rhythm was 7-8 Hz. This activity is reactive to stimulation. Drowsiness was manifested by background fragmentation; deeper stages of sleep were identified by K complexes and sleep spindles. There was subtle focal slowing over the left frontal region. There were no interictal epileptiform discharges. There were no electrographic seizures identified. There was no abnormal response to photic stimulation. Hyperventilation was not performed. Impression and clinical correlation: This EEG was obtained while awake and asleep and is abnormal due to mild diffuse slowing indicative of global cerebral dysfunction. Subtle left frontal focal slowing  indicated superimposed focal dysfunction in that area. Epileptiform abnormalities were not seen during this recording. Su Monks, MD [facility] [phone] If 7pm- 7am, please page neurology on call as listed in [place].   ECHOCARDIOGRAM COMPLETE  Result Date: 01/21/2021    ECHOCARDIOGRAM REPORT   Patient Name:   ASTRID VIDES Date of Exam: 01/21/2021 Medical Rec #:  [id number]           Height:       62.0 in Accession #:    [id number]          Weight:       215.4 lb Date of Birth:  [birth year]            BSA:          1.973 m Patient Age:    48 years            BP:           124/89 mmHg Patient Gender: F                   HR:           107 bpm. Exam Location:  [facility] Procedure: 2D Echo Indications:     Stroke I63.9  History:         Patient has no prior history of Echocardiogram examinations.  Sonographer:     Kathlen Brunswick RDCS Referring Phys:  [id number] Soledad Gerlach NIU Diagnosing Phys: Ida Rogue MD  Sonographer Comments: Technically challenging study due to limited acoustic windows, suboptimal subcostal window and suboptimal apical window. Image acquisition challenging due to patient body habitus. IMPRESSIONS  1. Left ventricular ejection fraction, by estimation, is 60 to 65%. The left ventricle has normal function. The left ventricle has no regional wall motion abnormalities. There is moderate left ventricular hypertrophy. Left ventricular diastolic parameters are consistent with Grade I diastolic dysfunction (impaired relaxation).  2. Right ventricular systolic function is normal. The right ventricular size is normal.  3. The mitral valve is normal in structure. No evidence of mitral valve regurgitation. No evidence of mitral stenosis.  4. Tricuspid valve regurgitation is mild to moderate.  5. The aortic valve is normal in structure. Aortic valve regurgitation is not visualized. Aortic valve sclerosis is present, with no evidence of aortic valve stenosis.  6. The inferior vena cava is normal  in size with greater than 50% respiratory variability, suggesting right atrial pressure of 3 mmHg. FINDINGS  Left Ventricle: Left ventricular  ejection fraction, by estimation, is 60 to 65%. The left ventricle has normal function. The left ventricle has no regional wall motion abnormalities. The left ventricular internal cavity size was normal in size. There is  moderate left ventricular hypertrophy. Left ventricular diastolic parameters are consistent with Grade I diastolic dysfunction (impaired relaxation). Right Ventricle: The right ventricular size is normal. No increase in right ventricular wall thickness. Right ventricular systolic function is normal. Left Atrium: Left atrial size was normal in size. Right Atrium: Right atrial size was normal in size. Pericardium: There is no evidence of pericardial effusion. Mitral Valve: The mitral valve is normal in structure. No evidence of mitral valve regurgitation. No evidence of mitral valve stenosis. Tricuspid Valve: The tricuspid valve is normal in structure. Tricuspid valve regurgitation is mild to moderate. No evidence of tricuspid stenosis. Aortic Valve: The aortic valve is normal in structure. Aortic valve regurgitation is not visualized. Aortic valve sclerosis is present, with no evidence of aortic valve stenosis. Aortic valve peak gradient measures 8.0 mmHg. Pulmonic Valve: The pulmonic valve was normal in structure. Pulmonic valve regurgitation is not visualized. No evidence of pulmonic stenosis. Aorta: The aortic root is normal in size and structure. Venous: The inferior vena cava is normal in size with greater than 50% respiratory variability, suggesting right atrial pressure of 3 mmHg. IAS/Shunts: No atrial level shunt detected by color flow Doppler.  LEFT VENTRICLE PLAX 2D LVIDd:         3.00 cm   Diastology LVIDs:         2.10 cm   LV e' medial:    6.20 cm/s LV PW:         1.50 cm   LV E/e' medial:  14.3 LV IVS:        1.30 cm   LV e' lateral:   5.98 cm/s  LVOT diam:     1.80 cm   LV E/e' lateral: 14.8 LV SV:         42 LV SV Index:   22 LVOT Area:     2.54 cm  LEFT ATRIUM         Index LA diam:    3.10 cm 1.57 cm/m  AORTIC VALVE                 PULMONIC VALVE AV Area (Vmax): 1.53 cm     PV Vmax:       1.18 m/s AV Vmax:        141.00 cm/s  PV Peak grad:  5.6 mmHg AV Peak Grad:   8.0 mmHg LVOT Vmax:      84.90 cm/s LVOT Vmean:     60.700 cm/s LVOT VTI:       0.167 m  AORTA Ao Root diam: 2.50 cm Ao Asc diam:  2.30 cm MITRAL VALVE                TRICUSPID VALVE MV Area (PHT): 6.43 cm     TV Peak grad:   19.2 mmHg MV Decel Time: 118 msec     TV Vmax:        2.19 m/s MV E velocity: 88.70 cm/s MV A velocity: 105.00 cm/s  SHUNTS MV E/A ratio:  0.84         Systemic VTI:  0.17 m                             Systemic Diam: 1.80 cm  Ida Rogue MD Electronically signed by Ida Rogue MD Signature Date/Time: 01/21/2021/7:03:01 PM    Final         Scheduled Meds:   stroke: mapping our early stages of recovery book   Does not apply Once   amLODipine  10 mg Oral Daily   aspirin  81 mg Oral Daily   atorvastatin  80 mg Oral Daily   chlorhexidine  15 mL Mouth Rinse BID   Chlorhexidine Gluconate Cloth  6 each Topical Daily   cyclobenzaprine  5 mg Oral TID   diphenhydrAMINE  25 mg Intravenous Q8H   docusate sodium  200 mg Oral QHS   escitalopram  20 mg Oral Daily   gabapentin  200 mg Oral TID   heparin  5,000 Units Subcutaneous Q8H   hydrALAZINE  25 mg Oral Q8H   insulin aspart  0-5 Units Subcutaneous QHS   insulin aspart  0-9 Units Subcutaneous TID WC   insulin glargine-yfgn  23 Units Subcutaneous BID   lisinopril  40 mg Oral Daily   loratadine  10 mg Oral Daily   LORazepam  0.25 mg Oral QHS   mouth rinse  15 mL Mouth Rinse q12n4p   melatonin  10 mg Oral QHS   methylPREDNISolone (SOLU-MEDROL) injection  40 mg Intravenous Daily   metoCLOPramide (REGLAN) injection  10 mg Intravenous Q8H   metoprolol tartrate  100 mg Oral BID   pantoprazole  (PROTONIX) IV  40 mg Intravenous Q12H   sodium chloride flush  3 mL Intravenous Once   ticagrelor  90 mg Oral BID   Continuous Infusions:  famotidine (PEPCID) IV 20 mg (01/22/21 2116)     LOS: 4 days    Time spent: 35 minutes    Sidney Ace, MD [facility]   If 7PM-7AM, please contact night-coverage  01/23/2021, 10:44 AM

## 2021-01-23 NOTE — Progress Notes (Signed)
PROGRESS NOTE    Lori [name]  OEU:[id number] DOB: [birth year] DOA: 01/19/2021 PCP: Pcp, No    Brief Narrative:  50 y.o. female with medical history significant of hypertension, hyperlipidemia, diabetes mellitus, stroke with left-sided weakness, thyroid disease (patient does not know which type of thyroid disease), depression with anxiety, CAD, s/p of CABG, pheochromocytoma (s/p of right adrenal gland removal per patient), who presents with aphasia, nausea and vomiting.   Patient has history of stroke with left-sided weakness.  At her normal baseline, patient is able to perform ADLs, typically independently with assistance of a walker.  Patient was last known normal at 10 PM last night 01/18/21. Today she started having difficult speaking at about 6 AM.  She also reports bilateral leg weakness, cannot walk due to weakness.  Patient denies chest pain, cough, shortness of breath.  No fever or chills.  No symptoms of UTI.  She states that she has nausea, and has vomited at least 8 times with bilious and greenish colored vomitus.  Denies diarrhea or abdominal pain.   Patient was found to have sinus tachycardia with heart rate up to 160s, patient was given IV metoprolol in the ED.  Heart rate improved to 120s.  Heart rate has improved however patient remains tachycardic and hypertensive.  She is mentating clearly but does have significant soft tissue neck swelling.  Allergic reaction but unknown trigger.  Patient attributes it to known iodine contrast allergy.  Maintaining airway with clear lungs.  12/19: Last night called by bedside RN as patient had episode of transient unresponsiveness lasting approximately 5 minutes.  Recovered spontaneously.  This morning patient will weak voice but seems to be mentating clearly.  She is somewhat disoriented to time and place.  Hemodynamics improved.  Can transfer to medical floor.  12/20: More lethargic this morning.  Case discussed with neurology.  Sedative  medication regimen substantially de-escalated.   Assessment & Plan:   Principal Problem:   Stroke [facility]) Active Problems:   Sinus tachycardia   Diabetes mellitus without complication (HCC)   Thyroid disease   Hypertension   CAD (coronary artery disease)   Pheochromocytoma   Depression   HLD (hyperlipidemia)   Nausea & vomiting   CVA (cerebral vascular accident) ([place])   Aphasia   Spell of abnormal behavior  Aphasia Bilateral lower extremity weakness Intermittent vomiting Concern for brainstem versus thalamic CVA CT negative Neurology following Aphasia waxing and waning Speech remains dysarthric MRI brain negative for acute infarct TTE, normal EF with grade 1 diastolic dysfunction Hemoglobin A1c 10.2.  Poor control Fasting lipids unremarkable Plan: Blood pressure management, normotensive blood pressure goals Continue telemetry monitoring Frequent neurochecks Continue therapy and speech evaluations High intensity statin  aspirin 81 daily Ticagrelor   New lip swelling Soft tissue neck swelling Presentation concerning for allergic reaction Possibly attributed to iodine allergy Also possible ticlopidine CT soft tissue neck negative Maintaining airway Plan: As needed Benadryl Last dose IV Solu-Medrol tomorrow a.m. DC H2 blocker Close monitoring respiratory status SLP follow-up  Sinus tachycardia Patient heart rate was up to 160s.   Etiology is not clear.   Patient has history of thyroid disease, but she does not know which type of thyroid disease.  Patient is not taking medications.   TSH is 0.257, therefore may have contributed partially.   Patient has history of pheochromocytoma, but the patient is s/p of adrenal gland removal, less likely to play a role here. Plan: Continue metoprolol titrate 100 mg twice daily As needed  IV metoprolol   Thyroid disease  TSH 0.257 Free T4 1.16, mild elevation   Diabetes mellitus without complication (HCC)  Patient is  taking Levemir 14 units twice daily at home Plan: -SSI -Increase Semglee to 23 units twice daily -Follow-up A1c, 10.2, poor control -Last dose of steroid tomorrow.  Cautious monitoring of glycemic control after discontinuation of steroid   Hypertension -- Blood pressure medications were held for 24 hours after admission Plan: Continue amlodipine 10 mg daily Continue lisinopril 40 mg daily As needed IV hydralazine  metoprolol 100 twice daily  titrate blood pressure regimen as necessary   CAD (coronary artery disease)  S/p of CABG -Aspirin, Lipitor, Brilinta   Pheochromocytoma  S/p of right adrenal gland removal surgery -No acute issues   Depression -Continue home medications   HLD (hyperlipidemia) -Lipitor   Nausea & vomiting  Etiology is not clear.   Patient does not have abdominal pain.   CT scan did not show acute intra-abdominal issues. Plan: For liquid diet As needed antiemetics SLP evaluation    CT-abd/pelvis: Patient appears to be status post hysterectomy. 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  Aphasia   DVT prophylaxis: SQ heparin Code Status: Full Family Communication: daughter [name] [phone] on 12/19 Disposition Plan: Status is: Inpatient  Remains inpatient appropriate because: Aphasic symptoms and waxing and waning.      Level of care: Telemetry Medical  Consultants:  Neurology  Procedures:  None  Antimicrobials: None   Subjective: Patient seen and examined.  More lethargic today.  Speech dysarthric  Objective: Vitals:   01/23/21 0038 01/23/21 0456 01/23/21 0735 01/23/21 1151  BP: 96/73 (!) 109/51 127/87 (!) 148/98  Pulse: 92 100 (!) 108 98  Resp: 19 17 16 16   Temp: 98.4 F (36.9 C) 97.8 F (36.6 C) 98.5 F (36.9 C) 98.4 F (36.9 C)  TempSrc:   Oral Oral  SpO2: 97% 99% 98% 100%  Weight:      Height:        Intake/Output Summary (Last 24 hours) at 01/23/2021  1309 Last data filed at 01/23/2021 0038 Gross per 24 hour  Intake 240 ml  Output 1000 ml  Net -760 ml   Filed Weights   01/19/21 0838 01/20/21 0049  Weight: 102.6 kg 97.7 kg    Examination:  General exam: No acute distress, lethargic Respiratory system: Lungs clear.  Room air.  Normal work breathing Cardiovascular system: Tachycardic, regular rhythm, no murmurs, no pedal edema Gastrointestinal system: Soft, NT/ND, normal bowel sounds Central nervous system: Alert, oriented x2, dysarthric speech Extremities: BLE weakness Skin: Lip swelling and soft tissue neck swollen, improving from prior Psychiatry: Judgement and insight appear normal. Mood & affect appropriate.     Data Reviewed: I have personally reviewed following labs and imaging studies  CBC: Recent Labs  Lab 01/19/21 0743 01/22/21 0850  WBC 8.0 17.4*  NEUTROABS 5.9 15.0*  HGB 13.0 13.4  HCT 39.2 40.2  MCV 79.8* 79.3*  PLT 366 627   Basic Metabolic Panel: Recent Labs  Lab 01/19/21 0743 01/19/21 0807 01/22/21 0850  NA 132*  --  139  K 3.9  --  3.9  CL 96*  --  107  CO2 24  --  24  GLUCOSE 336*  --  395*  BUN 12  --  26*  CREATININE 0.70 0.60 0.89  CALCIUM 9.1  --  8.7*  MG  --   --  2.2   GFR: Estimated Creatinine  Clearance: 84.3 mL/min (by C-G formula based on SCr of 0.89 mg/dL). Liver Function Tests: Recent Labs  Lab 01/19/21 0743  AST 56*  ALT 61*  ALKPHOS 90  BILITOT 0.9  PROT 8.0  ALBUMIN 3.7   Recent Labs  Lab 01/20/21 0709  LIPASE 110*   No results for input(s): AMMONIA in the last 168 hours. Coagulation Profile: Recent Labs  Lab 01/19/21 0743  INR 1.0   Cardiac Enzymes: No results for input(s): CKTOTAL, CKMB, CKMBINDEX, TROPONINI in the last 168 hours. BNP (last 3 results) No results for input(s): PROBNP in the last 8760 hours. HbA1C: No results for input(s): HGBA1C in the last 72 hours. CBG: Recent Labs  Lab 01/22/21 1320 01/22/21 1629 01/22/21 2207  01/23/21 0735 01/23/21 1145  GLUCAP 266* 233* 371* 207* 318*   Lipid Profile: No results for input(s): CHOL, HDL, LDLCALC, TRIG, CHOLHDL, LDLDIRECT in the last 72 hours.  Thyroid Function Tests: No results for input(s): TSH, T4TOTAL, FREET4, T3FREE, THYROIDAB in the last 72 hours.  Anemia Panel: No results for input(s): VITAMINB12, FOLATE, FERRITIN, TIBC, IRON, RETICCTPCT in the last 72 hours. Sepsis Labs: Recent Labs  Lab 01/19/21 0850 01/19/21 1002  LATICACIDVEN 2.0* 1.4    Recent Results (from the past 240 hour(s))  Blood Culture (routine x 2)     Status: None (Preliminary result)   Collection Time: 01/19/21  8:50 AM   Specimen: BLOOD  Result Value Ref Range Status   Specimen Description BLOOD BLOOD LEFT FOREARM  Final   Special Requests   Final    BOTTLES DRAWN AEROBIC AND ANAEROBIC Blood Culture results may not be optimal due to an inadequate volume of blood received in culture bottles   Culture   Final    NO GROWTH 4 DAYS Performed at [facility], [address], [place], [place] [address]    Report Status PENDING  Incomplete  Resp Panel by RT-PCR (Flu A&B, Covid) Nasopharyngeal Swab     Status: None   Collection Time: 01/19/21  9:44 AM   Specimen: Nasopharyngeal Swab; Nasopharyngeal(NP) swabs in vial transport medium  Result Value Ref Range Status   SARS Coronavirus 2 by RT PCR NEGATIVE NEGATIVE Final    Comment: (NOTE) SARS-CoV-2 target nucleic acids are NOT DETECTED.  The SARS-CoV-2 RNA is generally detectable in upper respiratory specimens during the acute phase of infection. The lowest concentration of SARS-CoV-2 viral copies this assay can detect is 138 copies/mL. A negative result does not preclude SARS-Cov-2 infection and should not be used as the sole basis for treatment or other patient management decisions. A negative result may occur with  improper specimen collection/handling, submission of specimen other than nasopharyngeal swab,  presence of viral mutation(s) within the areas targeted by this assay, and inadequate number of viral copies(<138 copies/mL). A negative result must be combined with clinical observations, patient history, and epidemiological information. The expected result is Negative.  Fact Sheet for Patients:  EntrepreneurPulse.com.au  Fact Sheet for Healthcare Providers:  IncredibleEmployment.be  This test is no t yet approved or cleared by the [place] FDA and  has been authorized for detection and/or diagnosis of SARS-CoV-2 by FDA under an Emergency Use Authorization (EUA). This EUA will remain  in effect (meaning this test can be used) for the duration of the COVID-19 declaration under Section 564(b)(1) of the Act, 21 U.S.C.section 360bbb-3(b)(1), unless the authorization is terminated  or revoked sooner.       Influenza A by PCR NEGATIVE NEGATIVE Final  Influenza B by PCR NEGATIVE NEGATIVE Final    Comment: (NOTE) The Xpert Xpress SARS-CoV-2/FLU/RSV plus assay is intended as an aid in the diagnosis of influenza from Nasopharyngeal swab specimens and should not be used as a sole basis for treatment. Nasal washings and aspirates are unacceptable for Xpert Xpress SARS-CoV-2/FLU/RSV testing.  Fact Sheet for Patients: EntrepreneurPulse.com.au  Fact Sheet for Healthcare Providers: IncredibleEmployment.be  This test is not yet approved or cleared by the [place] FDA and has been authorized for detection and/or diagnosis of SARS-CoV-2 by FDA under an Emergency Use Authorization (EUA). This EUA will remain in effect (meaning this test can be used) for the duration of the COVID-19 declaration under Section 564(b)(1) of the Act, 21 U.S.C. section 360bbb-3(b)(1), unless the authorization is terminated or revoked.  Performed at [facility], [address] [place], [place] [address]   Culture, blood  (Routine X 2) w Reflex to ID Panel     Status: None (Preliminary result)   Collection Time: 01/19/21 10:02 AM   Specimen: BLOOD  Result Value Ref Range Status   Specimen Description BLOOD RIGHT FOA  Final   Special Requests   Final    BOTTLES DRAWN AEROBIC AND ANAEROBIC Blood Culture adequate volume   Culture   Final    NO GROWTH 4 DAYS Performed at [facility], [address], [place], [place] [address]    Report Status PENDING  Incomplete         Radiology Studies: CT SOFT TISSUE NECK WO CONTRAST  Result Date: 01/21/2021 CLINICAL DATA:  Soft tissue swelling, infection suspected EXAM: CT NECK WITHOUT CONTRAST TECHNIQUE: Multidetector CT imaging of the neck was performed following the standard protocol without intravenous contrast. COMPARISON:  CTA head neck 01/19/2021 FINDINGS: Pharynx and larynx: The nasal cavity and nasopharynx are normal. The oropharynx and oral cavity are normal. The hypopharynx and larynx are normal. There is no abnormal soft tissue mass or fluid collection, within the confines of noncontrast technique. The parapharyngeal spaces are clear. The airway is patent. Salivary glands: Parotid and submandibular glands are normal. Thyroid: Unremarkable. Lymph nodes: There is no pathologic lymphadenopathy in the neck. Vascular: There is calcified atherosclerotic plaque in the bilateral carotid bulbs. Limited intracranial: The imaged portions of the intracranial compartment are unremarkable. Visualized orbits: The globes and orbits are unremarkable. Mastoids and visualized paranasal sinuses: Paranasal sinuses and mastoid air cells are clear. Skeleton: There is no significant degenerative change of the cervical spine. There is no acute osseous abnormality or aggressive osseous lesion. There is no visible canal hematoma Upper chest: . the imaged lung apices are clear. Other: None. IMPRESSION: Unremarkable noncontrast CT of the neck with no acute finding to explain the  patient's symptoms. Electronically Signed   By: Valetta Mole M.D.   On: 01/21/2021 16:38   MR THORACIC SPINE WO CONTRAST  Result Date: 01/21/2021 CLINICAL DATA:  Demyelinating disease EXAM: MRI THORACIC SPINE WITHOUT CONTRAST TECHNIQUE: Multiplanar, multisequence MR imaging of the thoracic spine was performed. No intravenous contrast was administered. COMPARISON:  None. FINDINGS: Evaluation is somewhat limited by motion artifact. Alignment:  Physiologic. Vertebrae: No fracture, evidence of discitis, or bone lesion. Cord:  Normal signal and morphology. Paraspinal and other soft tissues: Small right pleural effusion. Disc levels: T6-T7: Small central disc protrusion. No spinal canal stenosis or neural foraminal narrowing. T11-T12: Small left paracentral disc protrusion. No spinal canal stenosis or neural foraminal narrowing. Other thoracic vertebral levels are negative. IMPRESSION: 1. No evidence of demyelinating  disease in the thoracic spinal cord. 2. No spinal canal stenosis or neural foraminal narrowing. Electronically Signed   By: Merilyn Baba M.D.   On: 01/21/2021 22:22   EEG adult  Result Date: 01/22/2021 Derek Jack, MD     01/22/2021  9:15 PM Routine EEG Report Shavona Gunderman [name] is a 50 y.o. female with a history of strokes and aphasia who is undergoing an EEG to evaluate for seizures. Report: This EEG was acquired with electrodes placed according to the International 10-20 electrode system (including Fp1, Fp2, F3, F4, C3, C4, P3, P4, O1, O2, T3, T4, T5, T6, A1, A2, Fz, Cz, Pz). The following electrodes were missing or displaced: none. The occipital dominant rhythm was 7-8 Hz. This activity is reactive to stimulation. Drowsiness was manifested by background fragmentation; deeper stages of sleep were identified by K complexes and sleep spindles. There was subtle focal slowing over the left frontal region. There were no interictal epileptiform discharges. There were no electrographic seizures  identified. There was no abnormal response to photic stimulation. Hyperventilation was not performed. Impression and clinical correlation: This EEG was obtained while awake and asleep and is abnormal due to mild diffuse slowing indicative of global cerebral dysfunction. Subtle left frontal focal slowing indicated superimposed focal dysfunction in that area. Epileptiform abnormalities were not seen during this recording. Su Monks, MD [facility] [phone] If 7pm- 7am, please page neurology on call as listed in [place].   ECHOCARDIOGRAM COMPLETE  Result Date: 01/21/2021    ECHOCARDIOGRAM REPORT   Patient Name:   KANA REIMANN Date of Exam: 01/21/2021 Medical Rec #:  [id number]           Height:       62.0 in Accession #:    [id number]          Weight:       215.4 lb Date of Birth:  08-31-1970            BSA:          1.973 m Patient Age:    24 years            BP:           124/89 mmHg Patient Gender: F                   HR:           107 bpm. Exam Location:  [facility] Procedure: 2D Echo Indications:     Stroke I63.9  History:         Patient has no prior history of Echocardiogram examinations.  Sonographer:     Kathlen Brunswick RDCS Referring Phys:  [id number] Soledad Gerlach NIU Diagnosing Phys: Ida Rogue MD  Sonographer Comments: Technically challenging study due to limited acoustic windows, suboptimal subcostal window and suboptimal apical window. Image acquisition challenging due to patient body habitus. IMPRESSIONS  1. Left ventricular ejection fraction, by estimation, is 60 to 65%. The left ventricle has normal function. The left ventricle has no regional wall motion abnormalities. There is moderate left ventricular hypertrophy. Left ventricular diastolic parameters are consistent with Grade I diastolic dysfunction (impaired relaxation).  2. Right ventricular systolic function is normal. The right ventricular size is normal.  3. The mitral valve is normal in structure. No evidence of mitral valve  regurgitation. No evidence of mitral stenosis.  4. Tricuspid valve regurgitation is mild to moderate.  5. The aortic valve is normal in structure. Aortic valve regurgitation is not visualized. Aortic  valve sclerosis is present, with no evidence of aortic valve stenosis.  6. The inferior vena cava is normal in size with greater than 50% respiratory variability, suggesting right atrial pressure of 3 mmHg. FINDINGS  Left Ventricle: Left ventricular ejection fraction, by estimation, is 60 to 65%. The left ventricle has normal function. The left ventricle has no regional wall motion abnormalities. The left ventricular internal cavity size was normal in size. There is  moderate left ventricular hypertrophy. Left ventricular diastolic parameters are consistent with Grade I diastolic dysfunction (impaired relaxation). Right Ventricle: The right ventricular size is normal. No increase in right ventricular wall thickness. Right ventricular systolic function is normal. Left Atrium: Left atrial size was normal in size. Right Atrium: Right atrial size was normal in size. Pericardium: There is no evidence of pericardial effusion. Mitral Valve: The mitral valve is normal in structure. No evidence of mitral valve regurgitation. No evidence of mitral valve stenosis. Tricuspid Valve: The tricuspid valve is normal in structure. Tricuspid valve regurgitation is mild to moderate. No evidence of tricuspid stenosis. Aortic Valve: The aortic valve is normal in structure. Aortic valve regurgitation is not visualized. Aortic valve sclerosis is present, with no evidence of aortic valve stenosis. Aortic valve peak gradient measures 8.0 mmHg. Pulmonic Valve: The pulmonic valve was normal in structure. Pulmonic valve regurgitation is not visualized. No evidence of pulmonic stenosis. Aorta: The aortic root is normal in size and structure. Venous: The inferior vena cava is normal in size with greater than 50% respiratory variability, suggesting  right atrial pressure of 3 mmHg. IAS/Shunts: No atrial level shunt detected by color flow Doppler.  LEFT VENTRICLE PLAX 2D LVIDd:         3.00 cm   Diastology LVIDs:         2.10 cm   LV e' medial:    6.20 cm/s LV PW:         1.50 cm   LV E/e' medial:  14.3 LV IVS:        1.30 cm   LV e' lateral:   5.98 cm/s LVOT diam:     1.80 cm   LV E/e' lateral: 14.8 LV SV:         42 LV SV Index:   22 LVOT Area:     2.54 cm  LEFT ATRIUM         Index LA diam:    3.10 cm 1.57 cm/m  AORTIC VALVE                 PULMONIC VALVE AV Area (Vmax): 1.53 cm     PV Vmax:       1.18 m/s AV Vmax:        141.00 cm/s  PV Peak grad:  5.6 mmHg AV Peak Grad:   8.0 mmHg LVOT Vmax:      84.90 cm/s LVOT Vmean:     60.700 cm/s LVOT VTI:       0.167 m  AORTA Ao Root diam: 2.50 cm Ao Asc diam:  2.30 cm MITRAL VALVE                TRICUSPID VALVE MV Area (PHT): 6.43 cm     TV Peak grad:   19.2 mmHg MV Decel Time: 118 msec     TV Vmax:        2.19 m/s MV E velocity: 88.70 cm/s MV A velocity: 105.00 cm/s  SHUNTS MV E/A ratio:  0.84  Systemic VTI:  0.17 m                             Systemic Diam: 1.80 cm Ida Rogue MD Electronically signed by Ida Rogue MD Signature Date/Time: 01/21/2021/7:03:01 PM    Final         Scheduled Meds:   stroke: mapping our early stages of recovery book   Does not apply Once   amLODipine  10 mg Oral Daily   aspirin  81 mg Oral Daily   atorvastatin  80 mg Oral Daily   chlorhexidine  15 mL Mouth Rinse BID   Chlorhexidine Gluconate Cloth  6 each Topical Daily   cyclobenzaprine  5 mg Oral TID   diphenhydrAMINE  25 mg Intravenous Q8H   docusate sodium  200 mg Oral QHS   escitalopram  20 mg Oral Daily   gabapentin  200 mg Oral TID   heparin  5,000 Units Subcutaneous Q8H   hydrALAZINE  25 mg Oral Q8H   insulin aspart  0-5 Units Subcutaneous QHS   insulin aspart  0-9 Units Subcutaneous TID WC   insulin glargine-yfgn  23 Units Subcutaneous BID   lisinopril  40 mg Oral Daily   loratadine  10  mg Oral Daily   LORazepam  0.25 mg Oral QHS   mouth rinse  15 mL Mouth Rinse q12n4p   melatonin  10 mg Oral QHS   methylPREDNISolone (SOLU-MEDROL) injection  40 mg Intravenous Daily   metoCLOPramide (REGLAN) injection  10 mg Intravenous Q8H   metoprolol tartrate  100 mg Oral BID   pantoprazole (PROTONIX) IV  40 mg Intravenous Q12H   sodium chloride flush  3 mL Intravenous Once   ticagrelor  90 mg Oral BID   Continuous Infusions:  famotidine (PEPCID) IV 20 mg (01/23/21 1154)     LOS: 4 days    Time spent: 25 minutes    Sidney Ace, MD [facility]   If 7PM-7AM, please contact night-coverage  01/23/2021, 1:09 PM

## 2021-01-23 NOTE — Progress Notes (Signed)
Physical Therapy Treatment Patient Details Name: Lori [name] MRN: [id number] DOB: Mar 13, 1970 Today's Date: 01/23/2021   History of Present Illness Pt is a 50 y/o F admitted on 01/19/21 after presenting with c/c of aphasia, N&V. MRI shows no acute intracranial pathology.Marland Kitchen PMH: HTN, HLD, DM, stroke with L sided weakness, thyroid disease, depression with anxiety, CAD s/p CABG, pheochromocytoma (s/p R adrenal gland removal)    PT Comments    Pt seen this pm for continued PT with focus on sitting balance and trunk strengthening. Pt required MaxA to transition from long sitting in bed to sitting edge of bed. Pt tolerated 20 minutes sitting with feet on floor and occasional support on rail with R hand. Loss of balance posteriorly at times, regained with MinA or verbal cues. Overall improved tolerance for activity. Pt less lethargic and conversed throughout session. Pt required ModA x 2 to return to supine and position with pillows to maintain skin integrity and joint support. Pt will benefit from continued PT in a rehab setting.  Continue per POC.    Recommendations for follow up therapy are one component of a multi-disciplinary discharge planning process, led by the attending physician.  Recommendations may be updated based on patient status, additional functional criteria and insurance authorization.  Follow Up Recommendations  Acute inpatient rehab (3hours/day)     Assistance Recommended at Discharge Frequent or constant Supervision/Assistance  Equipment Recommendations       Recommendations for Other Services       Precautions / Restrictions Precautions Precautions: Fall Precaution Comments: L hemi (UE>LE), L field cut Restrictions Weight Bearing Restrictions: No     Mobility  Bed Mobility Overal bed mobility: Needs Assistance Bed Mobility: Supine to Sit;Sit to Supine Rolling: Mod assist;Max assist   Supine to sit: Mod assist;+2 for safety/equipment;+2 for physical  assistance Sit to supine: Max assist;HOB elevated   General bed mobility comments: increased time    Transfers                        Ambulation/Gait                   Stairs             Wheelchair Mobility    Modified Rankin (Stroke Patients Only)       Balance Overall balance assessment: Needs assistance Sitting-balance support: Feet supported;Single extremity supported Sitting balance-Leahy Scale: Fair Sitting balance - Comments:  (Pt tolerated sitting EOB with occassional MinA to prevent posterior LOB x 20 minutes with and without R UE support) Postural control: Posterior lean                                  Cognition Arousal/Alertness: Awake/alert Behavior During Therapy: WFL for tasks assessed/performed Overall Cognitive Status: Within Functional Limits for tasks assessed                                 General Comments:  (Talkative and responding appropriately) Functional Status Assessment: Patient has had a recent decline in their functional status and demonstrates the ability to make significant improvements in function in a reasonable and predictable amount of time.      Exercises      General Comments General comments (skin integrity, edema, etc.): Right dorsal forearm red and blotchy - nurse aware.  Pertinent Vitals/Pain Pain Assessment: No/denies pain    Home Living                          Prior Function            PT Goals (current goals can now be found in the care plan section) Acute Rehab PT Goals Patient Stated Goal: increase independence with mobility    Frequency    7X/week      PT Plan Current plan remains appropriate    Co-evaluation              AM-PAC PT "6 Clicks" Mobility   Outcome Measure  Help needed turning from your back to your side while in a flat bed without using bedrails?: A Lot Help needed moving from lying on your back to sitting on  the side of a flat bed without using bedrails?: Total Help needed moving to and from a bed to a chair (including a wheelchair)?: Total Help needed standing up from a chair using your arms (e.g., wheelchair or bedside chair)?: Total Help needed to walk in hospital room?: Total Help needed climbing 3-5 steps with a railing? : Total 6 Click Score: 7    End of Session Equipment Utilized During Treatment: Gait belt Activity Tolerance: Patient tolerated treatment well Patient left: in bed;with call bell/phone within reach;with nursing/sitter in room Nurse Communication: Mobility status PT Visit Diagnosis: Unsteadiness on feet (R26.81);Muscle weakness (generalized) (M62.81);Difficulty in walking, not elsewhere classified (R26.2);Hemiplegia and hemiparesis Hemiplegia - Right/Left: Left Hemiplegia - dominant/non-dominant: Non-dominant Hemiplegia - caused by: Unspecified     Time: 3546-5681 PT Time Calculation (min) (ACUTE ONLY): 40 min  Charges:  $Therapeutic Activity: 38-52 mins                    Mikel Cella, PTA   [name] 01/23/2021, 5:57 PM

## 2021-01-23 NOTE — Progress Notes (Signed)
Inpatient Diabetes Program Recommendations  AACE/ADA: New Consensus Statement on Inpatient Glycemic Control   Target Ranges:  Prepandial:   less than 140 mg/dL      Peak postprandial:   less than 180 mg/dL (1-2 hours)      Critically ill patients:  140 - 180 mg/dL    Latest Reference Range & Units 01/22/21 07:22 01/22/21 10:06 01/22/21 13:20 01/22/21 16:29 01/22/21 22:07 01/23/21 07:35  Glucose-Capillary 70 - 99 mg/dL 298 (H) 356 (H) 266 (H) 233 (H) 371 (H) 207 (H)   Review of Glycemic Control  Diabetes history: DM2 Outpatient Diabetes medications: Levemir 14 units BID, Novolog 0-10 units TID with meals Current orders for Inpatient glycemic control: Semglee 20 units BID, Novolog 0-9 units TID with meals, Novolog 0-5 units QHS; Solumedrol 40 mg daily   Inpatient Diabetes Program Recommendations:     Insulin: Please consider increasing Semglee to 23 units BID. Once diet is advanced and patient eating well, may want to consider ordering Novolog 3 units TID with meals for meal coverage if patient eats at least 50% of meals.   Thanks, Barnie Alderman, RN, MSN, CDE Diabetes Coordinator Inpatient Diabetes Program [phone] (Team Pager from 8am to 5pm)

## 2021-01-23 NOTE — Progress Notes (Signed)
[facility] IP rehab admissions - Noted PT/OT recommending inpatient rehab.  Please see social worker note from 12/17 stating that daughters cannot care for patient and daughters are requesting SNF placement.  I will not pursue inpatient rehab at this point.  If something changes, please feel free to contact me at [phone]

## 2021-01-23 NOTE — Consult Note (Signed)
At the request of Dr Quinn Axe, pts medical records to be obtained from [facility]. Medical Release Form signed by patient and faxed to number [phone].

## 2021-01-23 NOTE — Plan of Care (Signed)
  Problem: Health Behavior/Discharge Planning: Goal: Ability to manage health-related needs will improve Outcome: Progressing   

## 2021-01-24 ENCOUNTER — Inpatient Hospital Stay: Payer: Medicare Other

## 2021-01-24 DIAGNOSIS — R112 Nausea with vomiting, unspecified: Secondary | ICD-10-CM | POA: Diagnosis not present

## 2021-01-24 DIAGNOSIS — R4701 Aphasia: Secondary | ICD-10-CM | POA: Diagnosis not present

## 2021-01-24 DIAGNOSIS — F32A Depression, unspecified: Secondary | ICD-10-CM

## 2021-01-24 DIAGNOSIS — E1169 Type 2 diabetes mellitus with other specified complication: Secondary | ICD-10-CM

## 2021-01-24 DIAGNOSIS — Z8673 Personal history of transient ischemic attack (TIA), and cerebral infarction without residual deficits: Secondary | ICD-10-CM | POA: Diagnosis not present

## 2021-01-24 DIAGNOSIS — R531 Weakness: Secondary | ICD-10-CM

## 2021-01-24 DIAGNOSIS — E785 Hyperlipidemia, unspecified: Secondary | ICD-10-CM

## 2021-01-24 DIAGNOSIS — E119 Type 2 diabetes mellitus without complications: Secondary | ICD-10-CM

## 2021-01-24 DIAGNOSIS — I639 Cerebral infarction, unspecified: Secondary | ICD-10-CM | POA: Diagnosis not present

## 2021-01-24 DIAGNOSIS — R4689 Other symptoms and signs involving appearance and behavior: Secondary | ICD-10-CM | POA: Diagnosis not present

## 2021-01-24 LAB — CULTURE, BLOOD (ROUTINE X 2)
Culture: NO GROWTH
Culture: NO GROWTH
Special Requests: ADEQUATE

## 2021-01-24 LAB — GLUCOSE, CAPILLARY
Glucose-Capillary: 278 mg/dL — ABNORMAL HIGH (ref 70–99)
Glucose-Capillary: 327 mg/dL — ABNORMAL HIGH (ref 70–99)
Glucose-Capillary: 294 mg/dL — ABNORMAL HIGH (ref 70–99)
Glucose-Capillary: 345 mg/dL — ABNORMAL HIGH (ref 70–99)

## 2021-01-24 IMAGING — CR DG ABDOMEN 2V
3 series · 3 of 3 positions shown · non-contrast
Comparison: CT abdomen pelvis [DATE]

CLINICAL DATA: Nausea and vomiting

EXAM:
ABDOMEN - 2 VIEW

[abdomen erect]
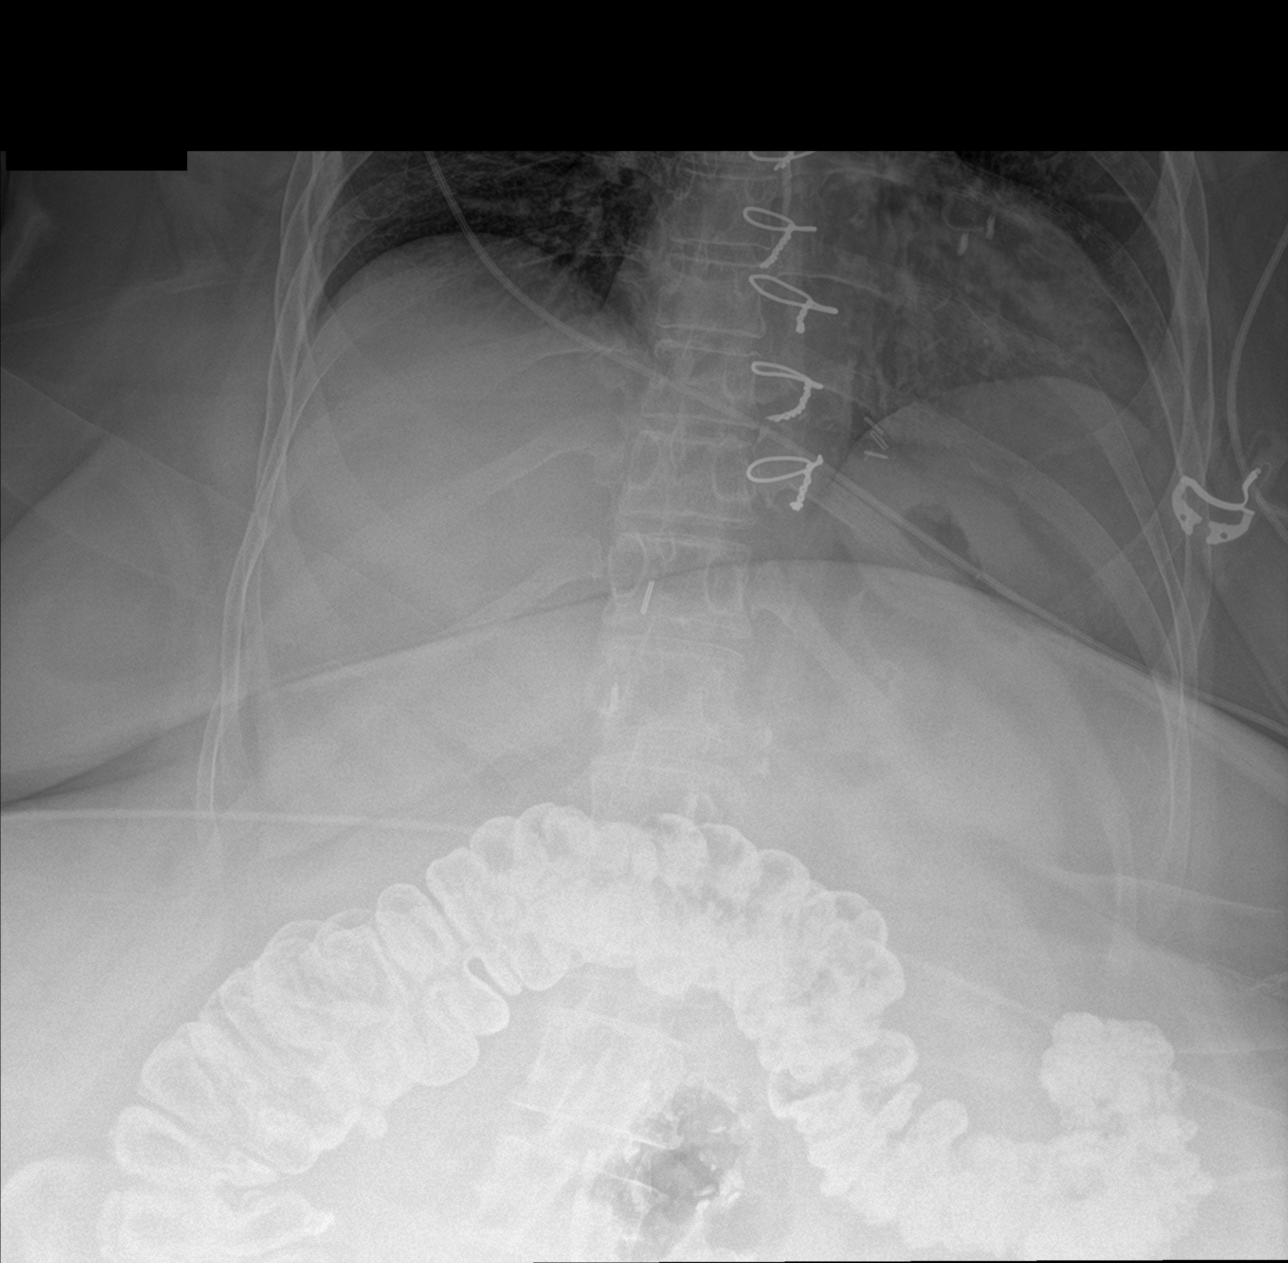

[abdomen supine (1 of 2)]
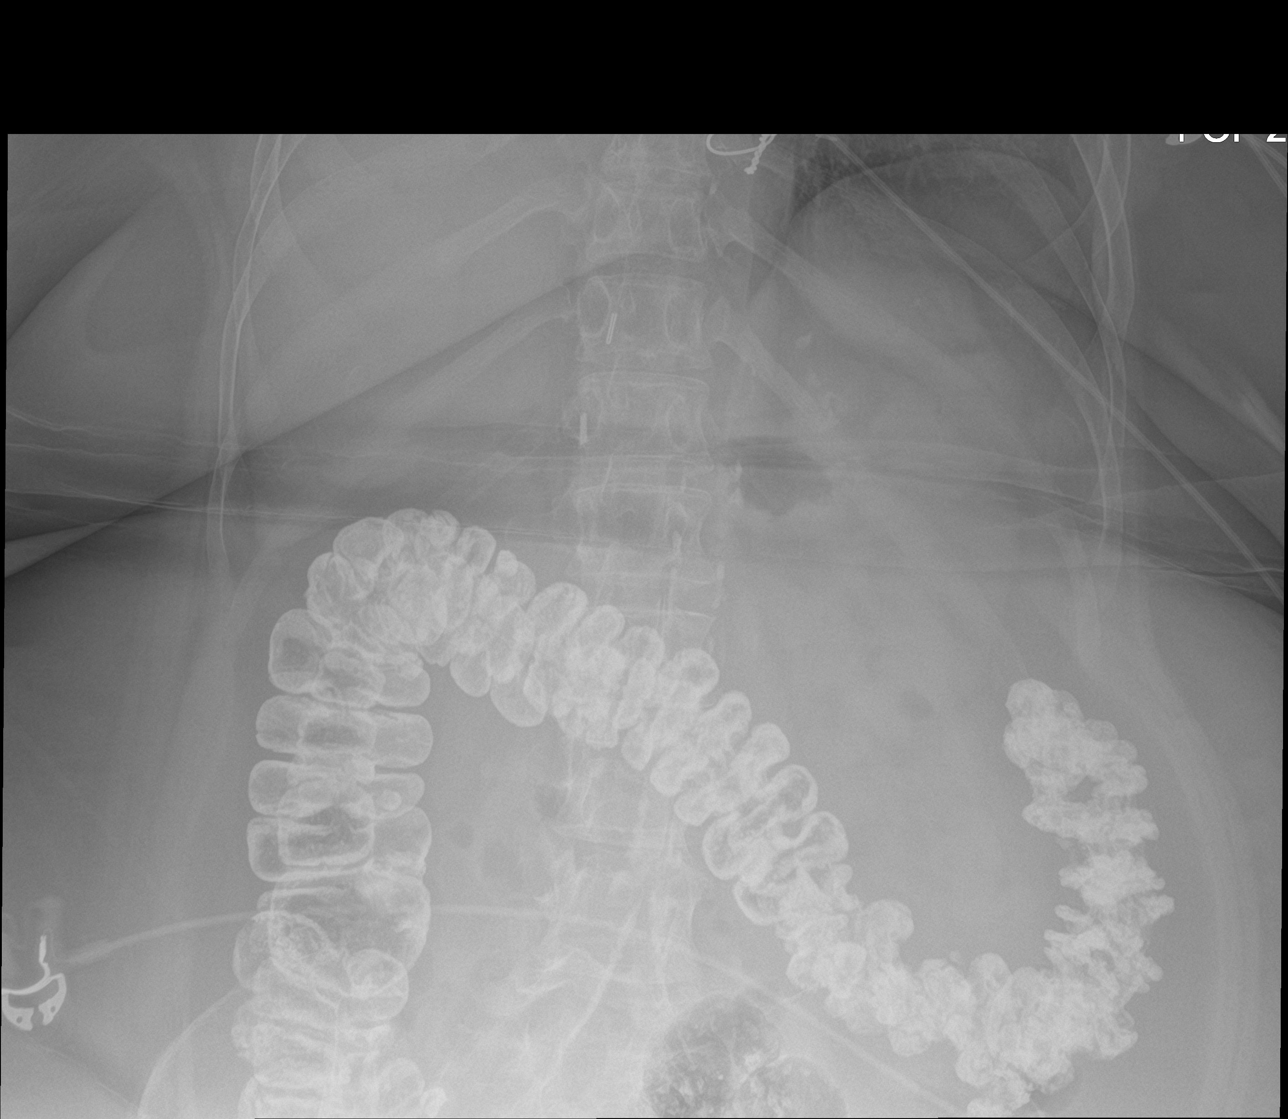

[abdomen supine (2 of 2)]
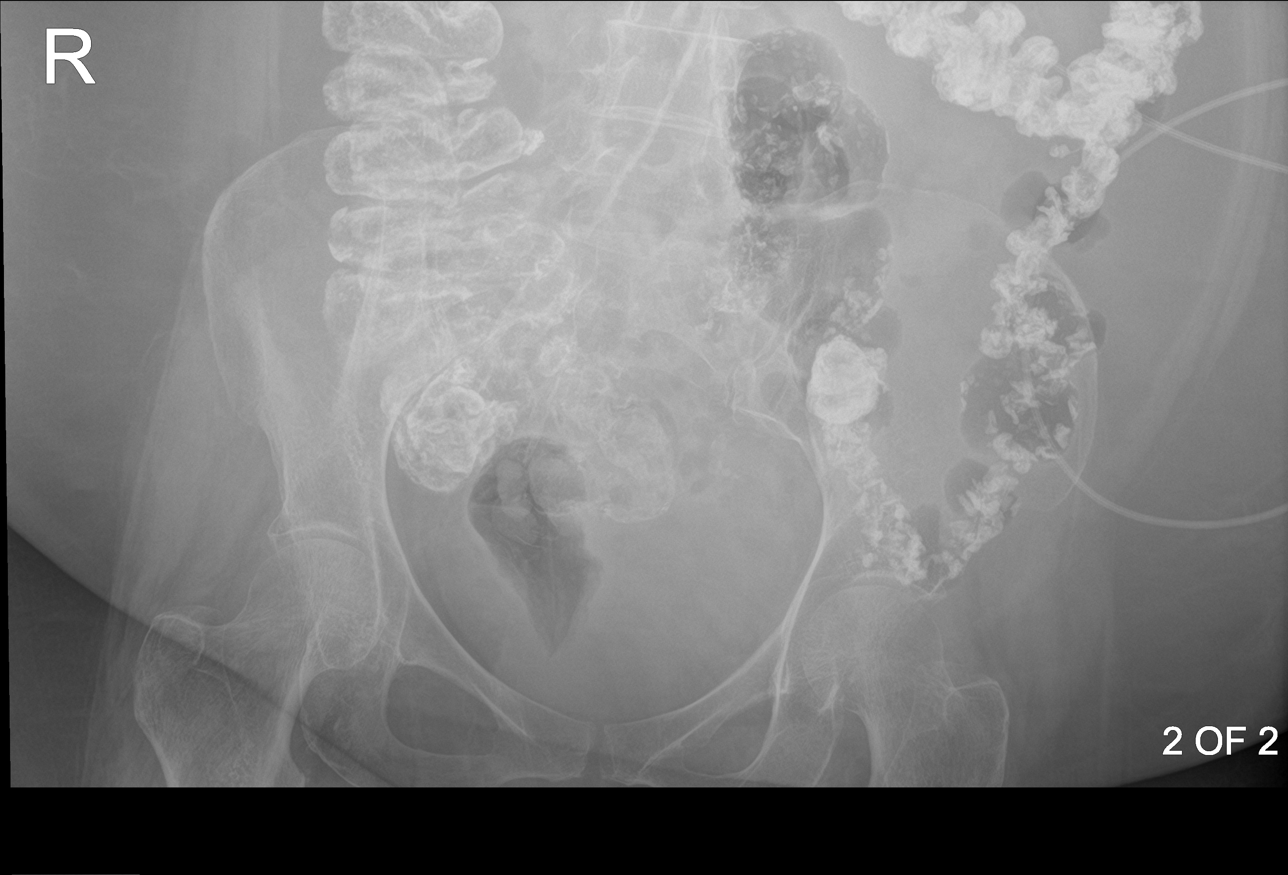

[3 of 3 positions shown; findings below may reference images not displayed]

FINDINGS: The bowel gas pattern is normal. There is no evidence of free air.
PO contrast reaches the sigmoid colon. No extravasation of the PO
contrast noted within the intraperitoneal space. Colonic
diverticulosis. No radio-opaque calculi or other significant
radiographic abnormality is seen. Vascular calcification.
IMPRESSION: 1. PO contrast reaches the sigmoid colon.
2. Colonic diverticulosis.
3.  Aortic Atherosclerosis ([UT]-[UT]).

## 2021-01-24 IMAGING — CT CT HEAD W/O CM
4 series · 16 of 47 positions shown, 18 images · non-contrast
Comparison: MRI head [DATE]

CLINICAL DATA: Stroke, follow up nausea vomiting

EXAM:
CT HEAD WITHOUT CONTRAST
TECHNIQUE: Contiguous axial images were obtained from the base of the skull
through the vertex without intravenous contrast.

[Series 2: head wo · axial · 0.42mm/px · z∈[+306,+421]mm · 7 of 31 slices shown, 9 images]
[im 4/31  brain]
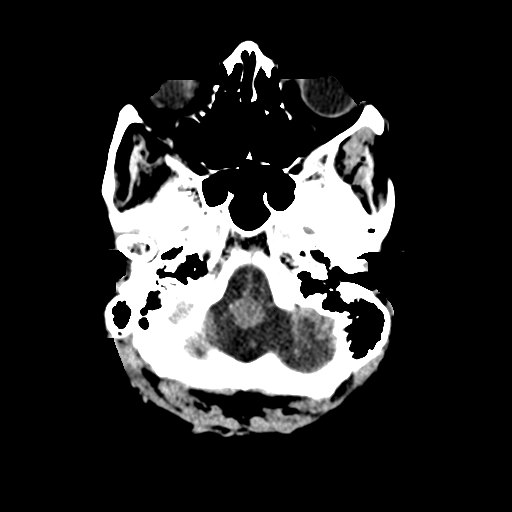
[im 4/31  bone]
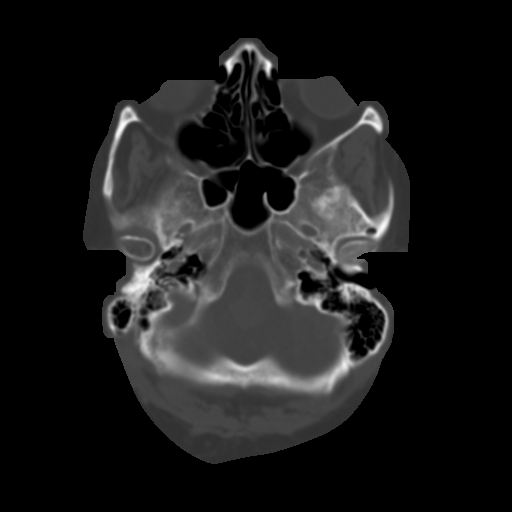
[im 8/31  brain]
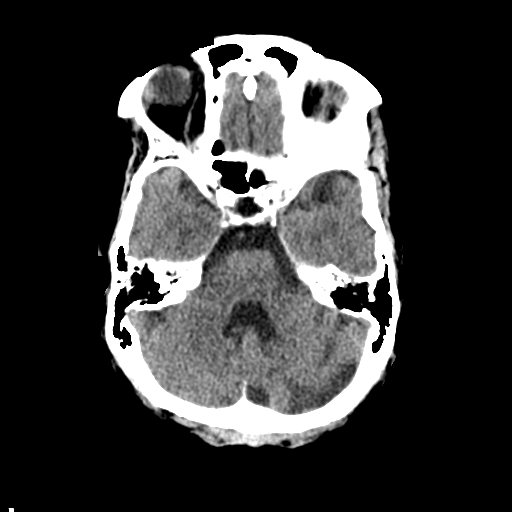
[im 12/31  brain]
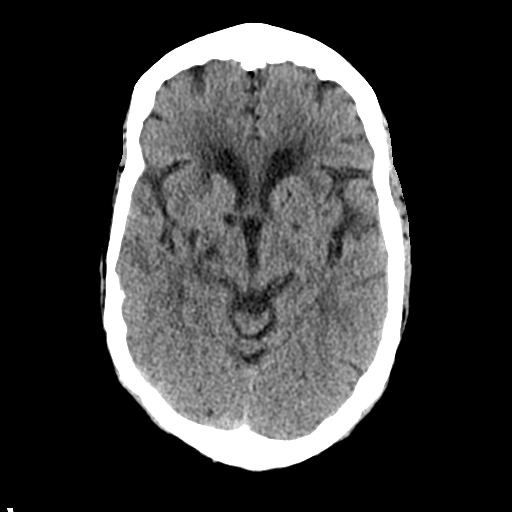
[im 16/31  brain]
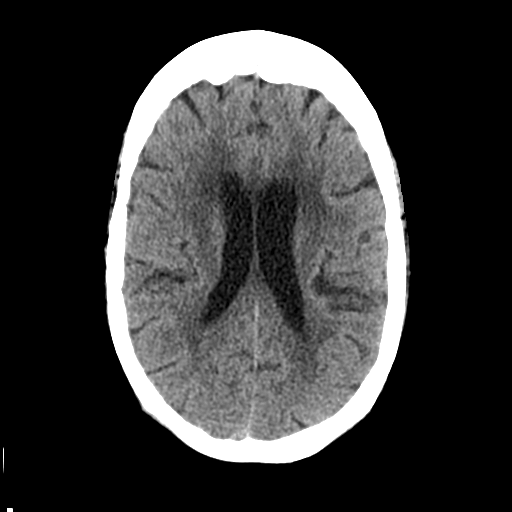
[im 19/31  brain]
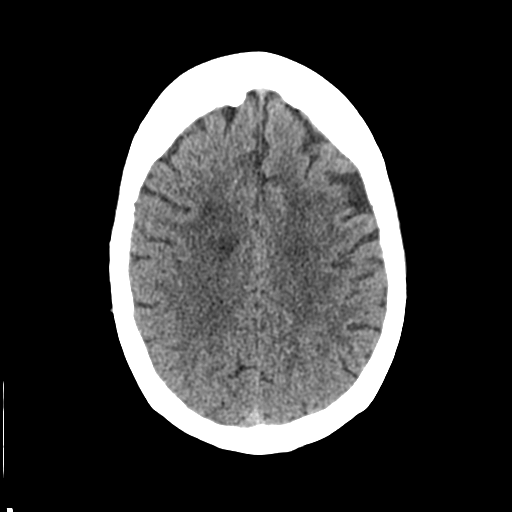
[im 19/31  bone]
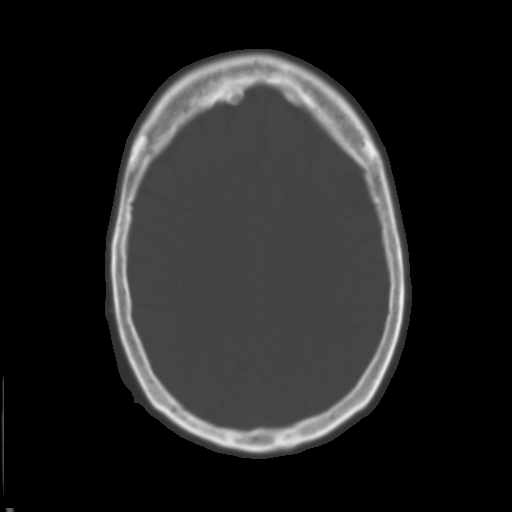
[im 23/31  brain]
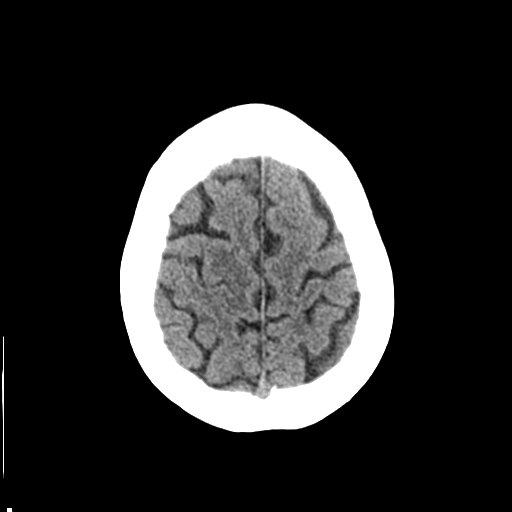
[im 27/31  brain]
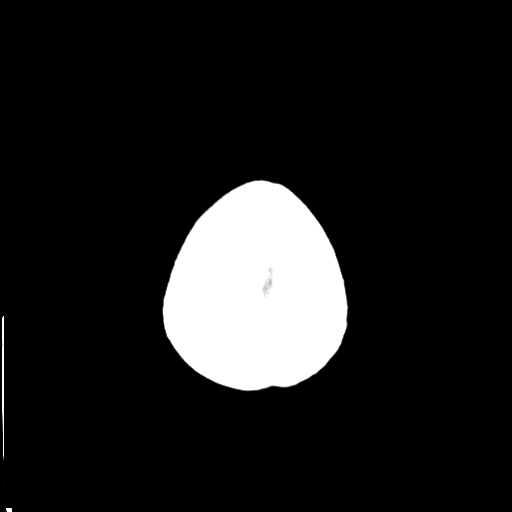

[Series 3: head bone · axial · 0.42mm/px · z∈[+305,+335]mm · 3 of 77 slices shown]
[im 8/77  bone]
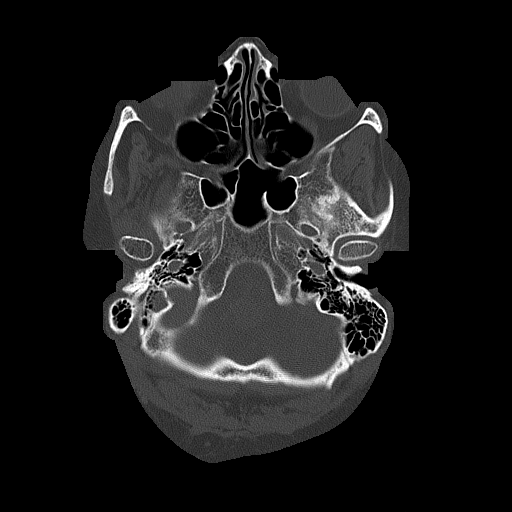
[im 16/77  bone]
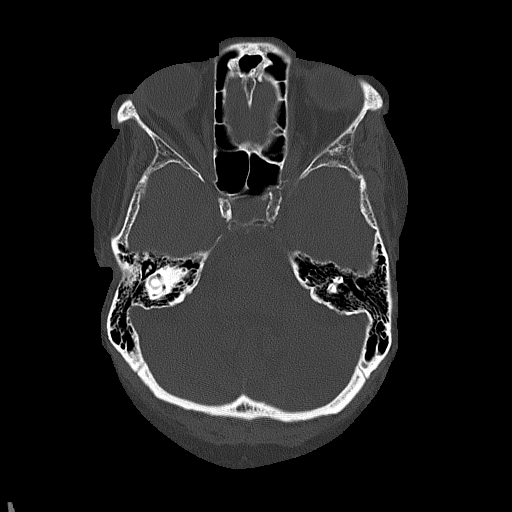
[im 23/77  bone]
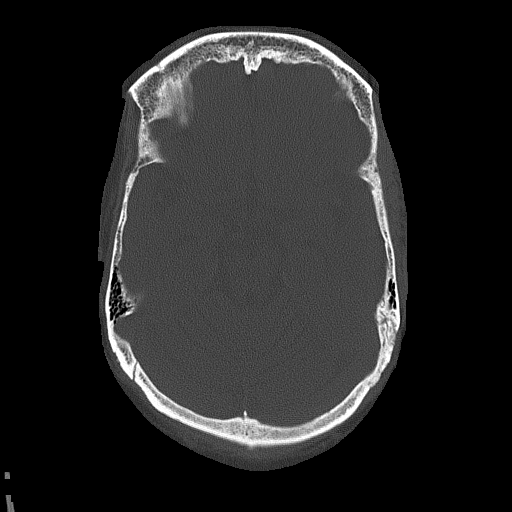

[Series 4: coronal soft tissue · coronal · 0.31mm/px · 3 of 65 slices shown]
[im 22/65  brain]
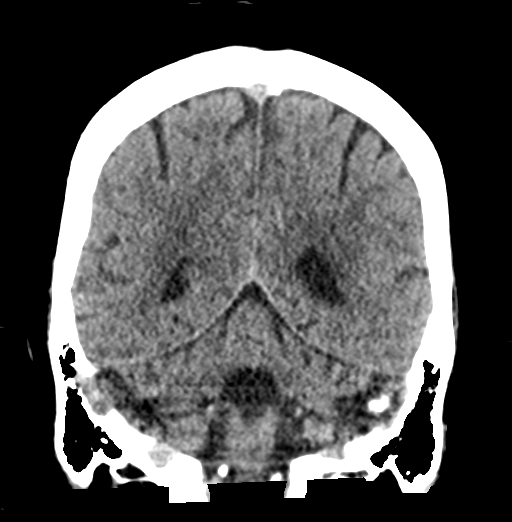
[im 29/65  brain]
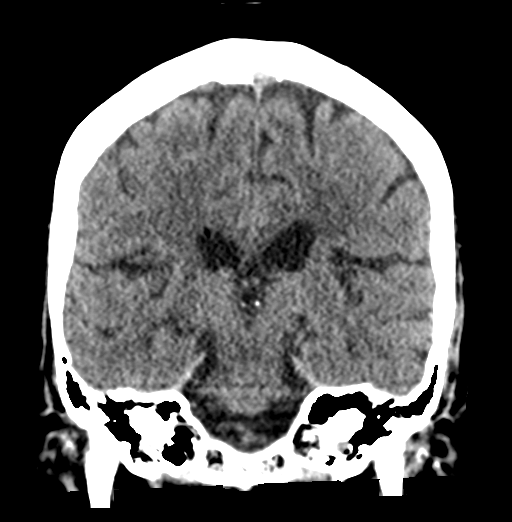
[im 36/65  brain]
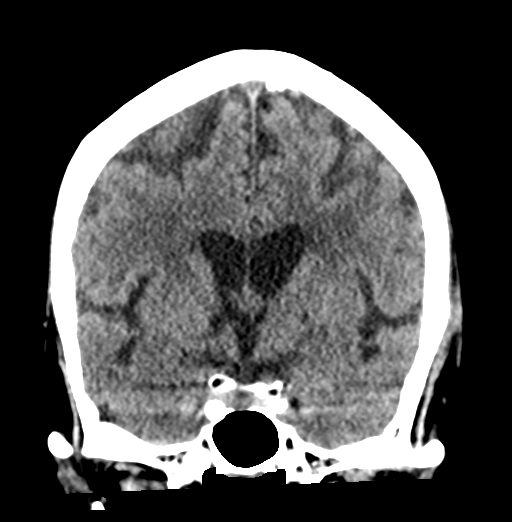

[Series 5: sagittal soft tissue · sagittal · 0.31mm/px · 3 of 49 slices shown]
[im 17/49  brain]
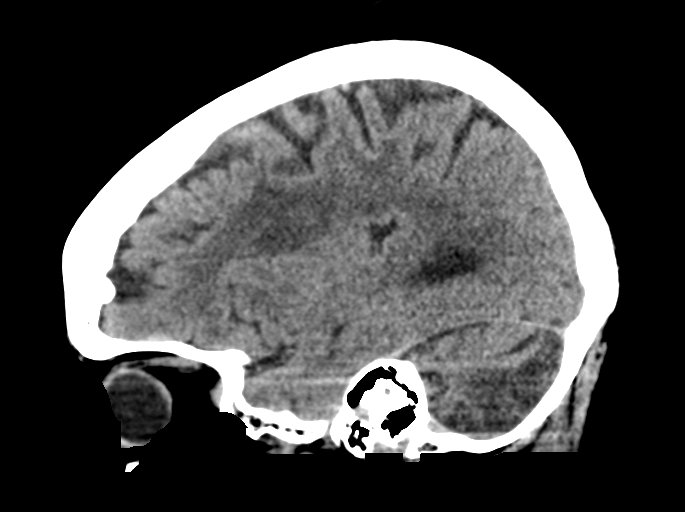
[im 25/49  brain]
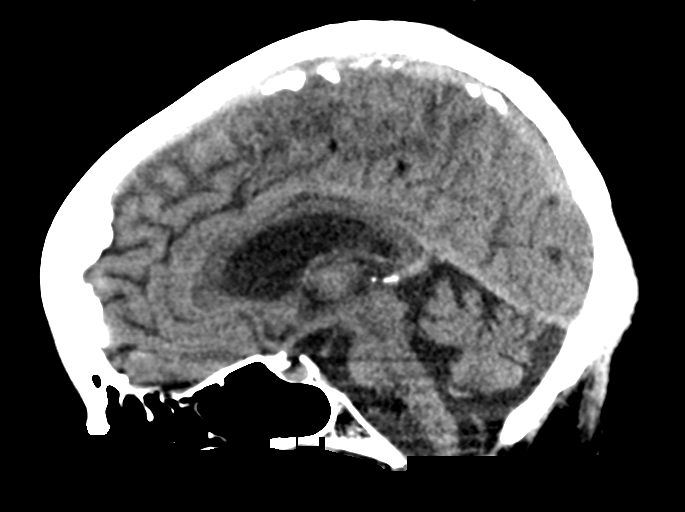
[im 33/49  brain]
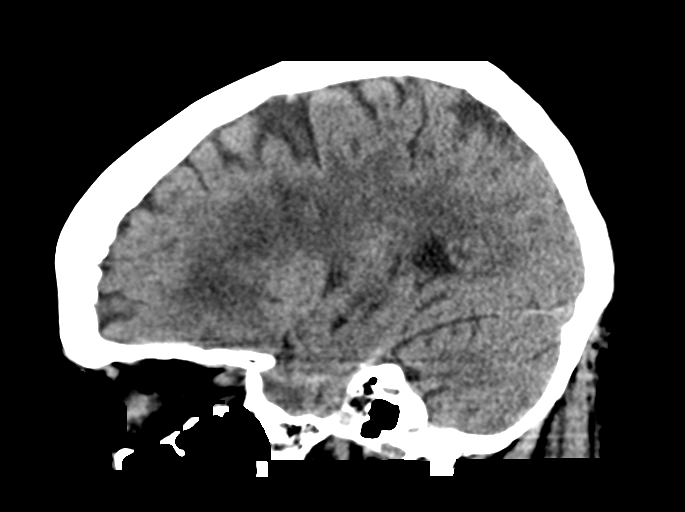

[16 of 47 positions shown; findings below may reference images not displayed]

FINDINGS: Brain:

Redemonstration of chronic bilateral basal ganglia, left thalami,
and bilateral cerebellar infarctions. No evidence of
large-territorial acute infarction. No parenchymal hemorrhage. No
mass lesion. No extra-axial collection.

No mass effect or midline shift. No hydrocephalus. Basilar cisterns
are patent.

Vascular: No hyperdense vessel. Atherosclerotic calcifications are
present within the cavernous internal carotid and vertebral
arteries.

Skull: No acute fracture or focal lesion.

Sinuses/Orbits: Paranasal sinuses and mastoid air cells are clear.
The orbits are unremarkable.

Other: None.
IMPRESSION: No acute intracranial abnormality in a patient with multiple prior
infarctions.

## 2021-01-24 MED ORDER — LORAZEPAM 2 MG/ML IJ SOLN: 0.5000 mg | Freq: Once | INTRAMUSCULAR | Status: DC

## 2021-01-24 MED ORDER — MENTHOL 3 MG MT LOZG
1.0000 | LOZENGE | OROMUCOSAL | Status: DC | PRN
  Administered 2021-01-24: 3 mg via ORAL
  Filled 2021-01-24: qty 9

## 2021-01-24 MED ORDER — PROSOURCE PLUS PO LIQD
30.0000 mL | Freq: Two times a day (BID) | ORAL | Status: DC
  Administered 2021-01-25: 10:00:00 30 mL via ORAL
  Filled 2021-01-24 (×3): qty 30

## 2021-01-24 MED ORDER — ADULT MULTIVITAMIN W/MINERALS CH
1.0000 | ORAL_TABLET | Freq: Every day | ORAL | Status: DC
  Administered 2021-01-25 – 2021-02-02 (×9): 1 via ORAL
  Filled 2021-01-24 (×9): qty 1

## 2021-01-24 MED ORDER — POLYETHYLENE GLYCOL 3350 17 G PO PACK
17.0000 g | PACK | Freq: Every day | ORAL | Status: DC
  Administered 2021-01-25 – 2021-01-31 (×5): 17 g via ORAL
  Filled 2021-01-24 (×8): qty 1

## 2021-01-24 MED ORDER — BOOST / RESOURCE BREEZE PO LIQD CUSTOM
1.0000 | Freq: Three times a day (TID) | ORAL | Status: DC
  Administered 2021-01-24 – 2021-01-25 (×2): 1 via ORAL

## 2021-01-24 MED ORDER — INSULIN ASPART 100 UNIT/ML IJ SOLN
4.0000 [IU] | Freq: Three times a day (TID) | INTRAMUSCULAR | Status: DC
  Administered 2021-01-24 – 2021-02-02 (×20): 4 [IU] via SUBCUTANEOUS
  Filled 2021-01-24 (×21): qty 1

## 2021-01-24 MED ORDER — SODIUM CHLORIDE 0.9 % IV SOLN
12.5000 mg | Freq: Once | INTRAVENOUS | Status: AC
  Administered 2021-01-24: 14:00:00 12.5 mg via INTRAVENOUS
  Filled 2021-01-24: qty 0.5

## 2021-01-24 NOTE — Progress Notes (Signed)
Occupational Therapy Treatment Patient Details Name: Lori [name] MRN: [id number] DOB: 1970-04-02 Today's Date: 01/24/2021   History of present illness Pt is a 49 y/o F admitted on 01/19/21 after presenting with c/c of aphasia, N&V. MRI shows no acute intracranial pathology.Marland Kitchen PMH: HTN, HLD, DM, stroke with L sided weakness, thyroid disease, depression with anxiety, CAD s/p CABG, pheochromocytoma (s/p R adrenal gland removal)   OT comments  Pt seen for PT/OT co-tx to maximize services. Pt requires MIN A +2 to perform sup to sit (mostly assisted in scooting to square hips for static EOB sitting). She initially requires MIN A to sustain static sit, but is able to improve to SBA with F balance, using R UE on bed rail for support. OT engages pt in extension tasks with L UE including extending elbow and digits to simulate reaching, which pt is able to participate in with AAROM. OT also places pillow under L UE in chair to elevate. Pt requires MIN/MOD A +2 to STS with arm in arm and knee blocking and requires MOD/MAX A +2 to SPS from bed to chair. Pt continues to have difficulty attempting to weight shift onto L side, so unable to take functional steps for this transfer. She requires MIN A in seated position to perform oral care. She is left with all needs met and in reach, requesting to bathe with CNA and OT calls to notify. Will continue to follow acutely. Continue to anticipate that pt will require AIR f/u OT services to improve safety with fxl ADL transfers and self care tasks. Her functional activity tolerance is improving and she is progressing with transfers and OOB Activity.    Recommendations for follow up therapy are one component of a multi-disciplinary discharge planning process, led by the attending physician.  Recommendations may be updated based on patient status, additional functional criteria and insurance authorization.    Follow Up Recommendations  Acute inpatient rehab (3hours/day)     Assistance Recommended at Discharge Frequent or constant Supervision/Assistance  Equipment Recommendations  Other (comment) (defer)    Recommendations for Other Services Rehab consult    Precautions / Restrictions Precautions Precautions: Fall Precaution Comments: L hemi (UE>LE), L field cut Restrictions Weight Bearing Restrictions: No       Mobility Bed Mobility Overal bed mobility: Needs Assistance Bed Mobility: Supine to Sit     Supine to sit: Min assist;+2 for physical assistance;HOB elevated     General bed mobility comments: increased time, use of bed rail with R UE, use of draw sheet to square hips    Transfers Overall transfer level: Needs assistance Equipment used: 2 person hand held assist Transfers: Sit to/from Stand;Bed to chair/wheelchair/BSC Sit to Stand: Min assist;Mod assist;+2 physical assistance Stand pivot transfers: Mod assist;Max assist;+2 physical assistance         General transfer comment: PT/OT foot and knee blocking, from bed to recliner, arm in arm, then cues to extend  R UE to reach for chair arm rest     Balance     Sitting balance-Leahy Scale: Fair       Standing balance-Leahy Scale: Poor                             ADL either performed or assessed with clinical judgement   ADL Overall ADL's : Needs assistance/impaired     Grooming: Oral care;Minimal assistance Grooming Details (indicate cue type and reason): chair position in bed  Toileting- Clothing Manipulation and Hygiene: Maximal assistance;Total assistance;Sit to/from stand;+2 for physical assistance Toileting - Clothing Manipulation Details (indicate cue type and reason): 2p to stand, MAX A of 1p to acheive posterior standing peri care            Extremity/Trunk Assessment              Vision       Perception     Praxis      Cognition Arousal/Alertness: Awake/alert Behavior During Therapy: WFL for tasks  assessed/performed Overall Cognitive Status: Within Functional Limits for tasks assessed                                 General Comments: still with fairly soft voice, but has improved expressive speech. Able to make needs known appropriately.          Exercises     Shoulder Instructions       General Comments      Pertinent Vitals/ Pain       Pain Assessment: Faces Faces Pain Scale: No hurt  Home Living                                          Prior Functioning/Environment              Frequency  Min 3X/week        Progress Toward Goals  OT Goals(current goals can now be found in the care plan section)  Progress towards OT goals: Progressing toward goals  Acute Rehab OT Goals Patient Stated Goal: to get stronger OT Goal Formulation: With patient Time For Goal Achievement: 02/03/21 Potential to Achieve Goals: Good  Plan Discharge plan remains appropriate    Co-evaluation    PT/OT/SLP Co-Evaluation/Treatment: Yes Reason for Co-Treatment: Complexity of the patient's impairments (multi-system involvement);For patient/therapist safety;To address functional/ADL transfers PT goals addressed during session: Mobility/safety with mobility OT goals addressed during session: ADL's and self-care      AM-PAC OT "6 Clicks" Daily Activity     Outcome Measure   Help from another person eating meals?: A Little Help from another person taking care of personal grooming?: A Little Help from another person toileting, which includes using toliet, bedpan, or urinal?: A Lot Help from another person bathing (including washing, rinsing, drying)?: A Lot Help from another person to put on and taking off regular upper body clothing?: A Lot Help from another person to put on and taking off regular lower body clothing?: Total 6 Click Score: 13    End of Session Equipment Utilized During Treatment: Gait belt  OT Visit Diagnosis: Unsteadiness  on feet (R26.81);Muscle weakness (generalized) (M62.81);Other symptoms and signs involving the nervous system (R29.898);Hemiplegia and hemiparesis Hemiplegia - Right/Left: Left Hemiplegia - dominant/non-dominant: Non-Dominant Hemiplegia - caused by: Cerebral infarction   Activity Tolerance Patient tolerated treatment well   Patient Left in bed;with call bell/phone within reach;with bed alarm set   Nurse Communication Mobility status        Time: 6078-9501 OT Time Calculation (min): 25 min  Charges: OT General Charges $OT Visit: 1 Visit OT Treatments $Self Care/Home Management : 8-22 mins  Gerrianne Scale, MS, OTR/L ascom [phone] 01/24/21, 12:30 PM

## 2021-01-24 NOTE — Progress Notes (Signed)
Initial Nutrition Assessment  DOCUMENTATION CODES:  Obesity unspecified  INTERVENTION:  Advance diet as medically able and as tolerated.  Add Boost Breeze po TID, each supplement provides 250 kcal and 9 grams of protein.  Add 30 ml ProSource Plus po BID, each supplement provides 100 kcal and 15 grams of protein.    Add MVI with minerals daily.  Encourage PO and supplement intake.  NUTRITION DIAGNOSIS:  Inadequate oral intake related to nausea, vomiting as evidenced by per patient/family report.  GOAL:  Patient will meet greater than or equal to 90% of their needs  MONITOR:  Diet advancement, PO intake, Supplement acceptance, Labs, Weight trends, I & O's  REASON FOR ASSESSMENT:  Diagnosis    ASSESSMENT:  50 yo female with a PMH of HTN, HLD, T2DM, stroke with left-sided weakness, thyroid disease (patient does not know which type of thyroid disease), depression with anxiety, CAD, s/p of CABG, pheochromocytoma (s/p of right adrenal gland removal per patient), who presents with aphasia, nausea, and vomiting. Admitted with stroke.  Met with pt at bedside. Pt covered in dark colored emesis. NT reports pt had fruit right before.  Of note, pt does not consume meat or dairy products.  Pt reports some nausea x3 days PTA. Otherwise, normal appetite and PO intake.  Per Epic, pt had 100% of pureed lunch yesterday and 0% of breakfast and lunch today.   Pt unable to tell RD what she usually weighs, but she reports that she thinks her weight has gone down. No weight history in Epic or Care Everywhere to confirm this.  Since pt has vomited, pt likely to be transitioned back to clear liquids.   Once this occurs, recommend Boost Breeze TID and ProSource Plus BID, as well as MVI with minerals.  Attached "Carbohydrate Counting for People with Diabetes" handout from the Academy of Nutrition and Dietetics to patient's discharge summary given A1c of 10.2%.  Medications: reviewed; colace BID,  SSI, Semglee, Ativan BID, Reglan TID, Protonix BID, Zofran PRN (given once today)  Labs: reviewed; CBG 207-350 (H) HbA1c: 10.2% (01/20/2021)  NUTRITION - FOCUSED PHYSICAL EXAM: Flowsheet Row Most Recent Value  Orbital Region Mild depletion  Upper Arm Region No depletion  Thoracic and Lumbar Region No depletion  Buccal Region No depletion  Temple Region No depletion  Clavicle Bone Region No depletion  Clavicle and Acromion Bone Region No depletion  Scapular Bone Region No depletion  Dorsal Hand No depletion  Patellar Region No depletion  Anterior Thigh Region No depletion  Posterior Calf Region No depletion  Edema (RD Assessment) Mild  [Generalized, BLE, BUE]  Hair Reviewed  Eyes Reviewed  Mouth Reviewed  Skin Reviewed  Nails Reviewed   Diet Order:   Diet Order             DIET - DYS 1 Room service appropriate? Yes with Assist; Fluid consistency: Nectar Thick  Diet effective now                  EDUCATION NEEDS:  Education needs have been addressed  Skin:  Skin Assessment: Reviewed RN Assessment  Last BM:  01/22/21 - x4, Type 7, green, large  Height:  Ht Readings from Last 1 Encounters:  01/20/21 5' 2"  (1.575 m)   Weight:  Wt Readings from Last 1 Encounters:  01/20/21 97.7 kg   BMI:  Body mass index is 39.4 kg/m.  Estimated Nutritional Needs:  Kcal:  2100-2300 Protein:  115-130 grams Fluid:  >2.1 L  Lori [name]  Sharel Behne, RD, LDN (she/her/hers) Clinical Inpatient Dietitian RD Pager/After-Hours/Weekend Pager # in [place]

## 2021-01-24 NOTE — Progress Notes (Signed)
[facility] IP rehab admissions - I spoke with patient's daughter, [name], today by phone.  Dtr tells me that patient was in a SNF for the past year.  Patient was recently home for 2 weeks.  [name] lives in a third floor apartment with more than 30 steps to her 3rd floor apartment.  I will need to discuss this patient with my rehab MD.  I have asked her insurance carrier to consider inpatient rehab admission.  I am concerned that patient needs mod to max assist.  Noted OT now recommending SNF placement.  I will follow up after I speak with rehab MD and after I hear back from insurance carrier.  Call for questions.  [phone]

## 2021-01-24 NOTE — TOC Initial Note (Signed)
Transition of Care [facility]) - Initial/Assessment Note    Patient Details  Name: Lori [name] MRN: [id number] Date of Birth: 1970-08-12  [facility]) CM/SW Contact:    Candie Chroman, LCSW Phone Number: 01/24/2021, 1:04 PM  Clinical Narrative:   CSW met with patient. No supports at bedside. CSW introduced role and explained that PT recommendations would be discussed. Patient prefers inpatient rehab at [facility] over SNF. She plans to go home with daughter after rehab and she [place] [facility]. Notified CIR admissions coordinator. Sent out SNF referral as backup plan. No further concerns. CSW encouraged patient to contact CSW as needed. CSW will continue to follow patient for support and facilitate discharge to SNF, if needed, once medically stable.               Expected Discharge Plan: [place] (vs SNF) Barriers to Discharge: Continued Medical Work up   Patient Goals and CMS Choice   CMS Medicare.gov Compare Post Acute Care list provided to:: Patient    Expected Discharge Plan and Services Expected Discharge Plan: [place] (vs SNF)     Post Acute Care Choice: IP Rehab Living arrangements for the past 2 months: Apartment                                      Prior Living Arrangements/Services Living arrangements for the past 2 months: Apartment Lives with:: Self Patient language and need for interpreter reviewed:: Yes Do you feel safe going back to the place where you live?: Yes      Need for Family Participation in Patient Care: Yes (Comment) Care giver support system in place?: Yes (comment)   Criminal Activity/Legal Involvement Pertinent to Current Situation/Hospitalization: No - Comment as needed  Activities of Daily Living      Permission Sought/Granted Permission sought to share information with : Facility Art therapist granted to share information with : Yes, Verbal Permission  Granted     Permission granted to share info w AGENCY: SNF's        Emotional Assessment Appearance:: Appears stated age Attitude/Demeanor/Rapport: Engaged, Gracious Affect (typically observed): Accepting, Appropriate, Calm, Pleasant Orientation: : Oriented to Self, Oriented to Place, Oriented to  Time, Oriented to Situation Alcohol / Substance Use: Not Applicable Psych Involvement: No (comment)  Admission diagnosis:  Aphasia [R47.01] Tachycardia [R00.0] CVA (cerebral vascular accident) ([place]) [I63.9] Stroke [facility]) [I63.9] Patient Active Problem List   Diagnosis Date Noted   Aphasia    Spell of abnormal behavior    Stroke ([place]) 01/19/2021   Sinus tachycardia 01/19/2021   CAD (coronary artery disease) 01/19/2021   CVA (cerebral vascular accident) ([place]) 01/19/2021   Diabetes mellitus without complication (HCC)    Thyroid disease    Hypertension    Pheochromocytoma    Depression    HLD (hyperlipidemia)    Nausea & vomiting    PCP:  Pcp, No Pharmacy:  No Pharmacies Listed    Social Determinants of Health (SDOH) Interventions    Readmission Risk Interventions No flowsheet data found.

## 2021-01-24 NOTE — Progress Notes (Signed)
Subjective: Lip swelling improved from yesterday. She is asleep but arousable, fully oriented, and interactive. No change in weakness since yesterday. No new complaints today. Awaiting OSH records.  Objective: Current vital signs: BP (!) 164/116 (BP Location: Left Arm)    Pulse (!) 110    Temp 97.7 F (36.5 C) (Oral)    Resp 16    Ht 5\' 2"  (1.575 m)    Wt 97.7 kg    SpO2 100%    BMI 39.40 kg/m  Vital signs in last 24 hours: Temp:  [97.7 F (36.5 C)-99 F (37.2 C)] 97.7 F (36.5 C) (12/21 1211) Pulse Rate:  [100-121] 110 (12/21 1211) Resp:  [13-31] 16 (12/21 1211) BP: (141-164)/(76-116) 164/116 (12/21 1211) SpO2:  [98 %-100 %] 100 % (12/21 1211)  Intake/Output from previous day: 12/20 0701 - 12/21 0700 In: 680 [P.O.:680] Out: 350 [Urine:350] Intake/Output this shift: Total I/O In: 0  Out: 850 [Urine:850] Nutritional status:  Diet Order             DIET - DYS 1 Room service appropriate? Yes with Assist; Fluid consistency: Nectar Thick  Diet effective now                  HEENT-  [place]/AT. Lip swelling is improved from yesterday  Lungs- Respirations unlabored but there is a faint, hushed stridorous quality to some inspirations Extremities- Warm and well perfused   Neurological Examination Mental Status: Asleep but arousable, oriented x4 Cranial Nerves: PERRL, VFF, EOMI, sensation intact, L UMN facial droop, hearing intact to voice LUE: Flexion contractures at elbow, wrist and digits. Arm adducted at shoulder. Increased tone. Some movement but none against gravity LLE: Increased tone. Falls to bed after passive elevation and release, with minimal effort against gravity. Spontaneous clonus to left foot intermittently. Sustained clonus with passive dorsiflexion by examiner.  RUE, RLE: at least 4-/5 throughout Cerebellar: mild ataxia on R FNF, UTA on L Gait: Unable to assess.   Lab Results: Results for orders placed or performed during the hospital encounter of 01/19/21  (from the past 48 hour(s))  Glucose, capillary     Status: Abnormal   Collection Time: 01/22/21  4:29 PM  Result Value Ref Range   Glucose-Capillary 233 (H) 70 - 99 mg/dL    Comment: Glucose reference range applies only to samples taken after fasting for at least 8 hours.  Glucose, capillary     Status: Abnormal   Collection Time: 01/22/21 10:07 PM  Result Value Ref Range   Glucose-Capillary 371 (H) 70 - 99 mg/dL    Comment: Glucose reference range applies only to samples taken after fasting for at least 8 hours.  Glucose, capillary     Status: Abnormal   Collection Time: 01/23/21  7:35 AM  Result Value Ref Range   Glucose-Capillary 207 (H) 70 - 99 mg/dL    Comment: Glucose reference range applies only to samples taken after fasting for at least 8 hours.  Glucose, capillary     Status: Abnormal   Collection Time: 01/23/21 11:45 AM  Result Value Ref Range   Glucose-Capillary 318 (H) 70 - 99 mg/dL    Comment: Glucose reference range applies only to samples taken after fasting for at least 8 hours.  Glucose, capillary     Status: Abnormal   Collection Time: 01/23/21  4:41 PM  Result Value Ref Range   Glucose-Capillary 350 (H) 70 - 99 mg/dL    Comment: Glucose reference range applies only to samples taken  after fasting for at least 8 hours.  Glucose, capillary     Status: Abnormal   Collection Time: 01/23/21 10:37 PM  Result Value Ref Range   Glucose-Capillary 356 (H) 70 - 99 mg/dL    Comment: Glucose reference range applies only to samples taken after fasting for at least 8 hours.  Glucose, capillary     Status: Abnormal   Collection Time: 01/24/21  8:10 AM  Result Value Ref Range   Glucose-Capillary 278 (H) 70 - 99 mg/dL    Comment: Glucose reference range applies only to samples taken after fasting for at least 8 hours.  Glucose, capillary     Status: Abnormal   Collection Time: 01/24/21 12:08 PM  Result Value Ref Range   Glucose-Capillary 327 (H) 70 - 99 mg/dL    Comment:  Glucose reference range applies only to samples taken after fasting for at least 8 hours.    Recent Results (from the past 240 hour(s))  Blood Culture (routine x 2)     Status: None (Preliminary result)   Collection Time: 01/19/21  8:50 AM   Specimen: BLOOD  Result Value Ref Range Status   Specimen Description BLOOD BLOOD LEFT FOREARM  Final   Special Requests   Final    BOTTLES DRAWN AEROBIC AND ANAEROBIC Blood Culture results may not be optimal due to an inadequate volume of blood received in culture bottles   Culture   Final    NO GROWTH 4 DAYS Performed at [facility], [address], [place], [place] [address]    Report Status PENDING  Incomplete  Resp Panel by RT-PCR (Flu A&B, Covid) Nasopharyngeal Swab     Status: None   Collection Time: 01/19/21  9:44 AM   Specimen: Nasopharyngeal Swab; Nasopharyngeal(NP) swabs in vial transport medium  Result Value Ref Range Status   SARS Coronavirus 2 by RT PCR NEGATIVE NEGATIVE Final    Comment: (NOTE) SARS-CoV-2 target nucleic acids are NOT DETECTED.  The SARS-CoV-2 RNA is generally detectable in upper respiratory specimens during the acute phase of infection. The lowest concentration of SARS-CoV-2 viral copies this assay can detect is 138 copies/mL. A negative result does not preclude SARS-Cov-2 infection and should not be used as the sole basis for treatment or other patient management decisions. A negative result may occur with  improper specimen collection/handling, submission of specimen other than nasopharyngeal swab, presence of viral mutation(s) within the areas targeted by this assay, and inadequate number of viral copies(<138 copies/mL). A negative result must be combined with clinical observations, patient history, and epidemiological information. The expected result is Negative.  Fact Sheet for Patients:  EntrepreneurPulse.com.au  Fact Sheet for Healthcare Providers:   IncredibleEmployment.be  This test is no t yet approved or cleared by the [place] FDA and  has been authorized for detection and/or diagnosis of SARS-CoV-2 by FDA under an Emergency Use Authorization (EUA). This EUA will remain  in effect (meaning this test can be used) for the duration of the COVID-19 declaration under Section 564(b)(1) of the Act, 21 U.S.C.section 360bbb-3(b)(1), unless the authorization is terminated  or revoked sooner.       Influenza A by PCR NEGATIVE NEGATIVE Final   Influenza B by PCR NEGATIVE NEGATIVE Final    Comment: (NOTE) The Xpert Xpress SARS-CoV-2/FLU/RSV plus assay is intended as an aid in the diagnosis of influenza from Nasopharyngeal swab specimens and should not be used as a sole basis for treatment. Nasal washings and aspirates are unacceptable for  Xpert Xpress SARS-CoV-2/FLU/RSV testing.  Fact Sheet for Patients: EntrepreneurPulse.com.au  Fact Sheet for Healthcare Providers: IncredibleEmployment.be  This test is not yet approved or cleared by the [place] FDA and has been authorized for detection and/or diagnosis of SARS-CoV-2 by FDA under an Emergency Use Authorization (EUA). This EUA will remain in effect (meaning this test can be used) for the duration of the COVID-19 declaration under Section 564(b)(1) of the Act, 21 U.S.C. section 360bbb-3(b)(1), unless the authorization is terminated or revoked.  Performed at [facility], [address] [place], [place] [address]   Culture, blood (Routine X 2) w Reflex to ID Panel     Status: None (Preliminary result)   Collection Time: 01/19/21 10:02 AM   Specimen: BLOOD  Result Value Ref Range Status   Specimen Description BLOOD RIGHT FOA  Final   Special Requests   Final    BOTTLES DRAWN AEROBIC AND ANAEROBIC Blood Culture adequate volume   Culture   Final    NO GROWTH 4 DAYS Performed at [facility],  [address] [place], [place] [address]    Report Status PENDING  Incomplete    Lipid Panel No results for input(s): CHOL, TRIG, HDL, CHOLHDL, VLDL, LDLCALC in the last 72 hours.   Studies/Results: EEG adult  Result Date: 01/22/2021 Derek Jack, MD     01/22/2021  9:15 PM Routine EEG Report Lori [name] is a 50 y.o. female with a history of strokes and aphasia who is undergoing an EEG to evaluate for seizures. Report: This EEG was acquired with electrodes placed according to the International 10-20 electrode system (including Fp1, Fp2, F3, F4, C3, C4, P3, P4, O1, O2, T3, T4, T5, T6, A1, A2, Fz, Cz, Pz). The following electrodes were missing or displaced: none. The occipital dominant rhythm was 7-8 Hz. This activity is reactive to stimulation. Drowsiness was manifested by background fragmentation; deeper stages of sleep were identified by K complexes and sleep spindles. There was subtle focal slowing over the left frontal region. There were no interictal epileptiform discharges. There were no electrographic seizures identified. There was no abnormal response to photic stimulation. Hyperventilation was not performed. Impression and clinical correlation: This EEG was obtained while awake and asleep and is abnormal due to mild diffuse slowing indicative of global cerebral dysfunction. Subtle left frontal focal slowing indicated superimposed focal dysfunction in that area. Epileptiform abnormalities were not seen during this recording. Su Monks, MD [facility] [phone] If 7pm- 7am, please page neurology on call as listed in [place].    Medications: Scheduled:   stroke: mapping our early stages of recovery book   Does not apply Once   (feeding supplement) PROSource Plus  30 mL Oral BID BM   amLODipine  10 mg Oral Daily   aspirin  81 mg Oral Daily   atorvastatin  80 mg Oral Daily   chlorhexidine  15 mL Mouth Rinse BID   Chlorhexidine Gluconate Cloth  6 each Topical  Daily   cyclobenzaprine  5 mg Oral TID   diphenhydrAMINE  25 mg Intravenous Q8H   docusate sodium  200 mg Oral QHS   escitalopram  20 mg Oral Daily   feeding supplement  1 Container Oral TID BM   gabapentin  200 mg Oral TID   heparin  5,000 Units Subcutaneous Q8H   hydrALAZINE  25 mg Oral Q8H   insulin aspart  0-5 Units Subcutaneous QHS   insulin aspart  0-9 Units Subcutaneous TID WC  insulin aspart  4 Units Subcutaneous TID WC   insulin glargine-yfgn  23 Units Subcutaneous BID   lisinopril  40 mg Oral Daily   loratadine  10 mg Oral Daily   LORazepam  0.5 mg Intravenous Once   LORazepam  0.25 mg Oral QHS   mouth rinse  15 mL Mouth Rinse q12n4p   melatonin  10 mg Oral QHS   metoCLOPramide (REGLAN) injection  10 mg Intravenous Q8H   metoprolol tartrate  100 mg Oral BID   multivitamin with minerals  1 tablet Oral Daily   pantoprazole (PROTONIX) IV  40 mg Intravenous Q12H   polyethylene glycol  17 g Oral Daily   sodium chloride flush  3 mL Intravenous Once   ticagrelor  90 mg Oral BID   Continuous:   Assessment: 50 year old female presenting with acute onset of mutism and BLE weakness with intermittent vomiting.lamic stroke versus a left perisylvian stroke.  1.. Exams and mental status were fluctuating wrt aphasia. EEG showed no epileptiform abnl. Mental status and aphasia both improved on my examination today. 2. MRI brain performed 01/20/21 shows no acute intracranial pathology. There is moderate parenchymal volume loss and chronic white matter microangiopathy, advanced for age. 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 Multiple small chronic microhemorrhages in a central distribution, likely hypertensive in etiology. 3. CTA of head and neck: Generalized and severe atherosclerosis, especially for age. Most severe abnormalities are in the posterior circulation where there are left vertebral and basilar  occlusions which are age indeterminate, as well as severe right V4 segment stenosis. No flow limiting stenosis in the cervical carotid circulation. Probable flow limiting stenosis at the carotid siphons, especially on the right. 4. Lip swelling likely 2/2 allergic rxn to iodinated contrast or possibly ticlodipine. Pt is on benadryl and solumedrol. 5. Patient was previously a clinic patient of mine 2 years ago in [place], [place]. At that time she told me that she had a hx primary brain malignancy resected yrs ago at [facility]. I will obtain a records release from pt tomorrow if she is able to consent so that we can further clarify this. I would like to get MRI brain wwo contrast but as I recall she has a mild allergy to gad and requires steroid prep for MRI. Given that she is already on solumedrol and benadryl for another allergic rxn this hospital stay, will hold off on this. 6. MRI t spine wo contrast 12/18 unrevealing  Given her brain MRI negative for acute process suspect her presentation may have been 2/2 recrudescence in the setting of GI illness   Recommendations: 1. BP management. Averaging about 654-650 systolic. Goal given her severe stenoses on CTA should be a SBP of 130-150 for now.   2. TTE no intracardiac clot 3. Check Hgb A1xc. BG in 300s since admission (pt is also on solumedrol) 4. Cardiac telemetry 5. Frequent neuro checks 6. PT/OT/Speech 7. 80 mg atorvastatin   8. ASA 81 mg po qd. Has an allergy to clopidogrel. 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    9. IVF 10. Monitor her lip swelling closely. Also monitor breathing, breath sounds especially in the neck region and continuous pulse ox. Respiratory therapy for testing of NIF and FVC q12h.   11. Obtained records release and faxed to [facility]  Will continue to follow.  Su Monks, MD [facility] [phone]  If 7pm- 7am, please page neurology on call  as listed in [place].

## 2021-01-24 NOTE — Progress Notes (Signed)
Physical Therapy Treatment Patient Details Name: Lori [name] MRN: [id number] DOB: 1970/11/01 Today's Date: 01/24/2021   History of Present Illness Pt is a 50 y/o F admitted on 01/19/21 after presenting with c/c of aphasia, N&V. MRI shows no acute intracranial pathology.Marland Kitchen PMH: HTN, HLD, DM, stroke with L sided weakness, thyroid disease, depression with anxiety, CAD s/p CABG, pheochromocytoma (s/p R adrenal gland removal)    PT Comments    Patient received in bed, agrees to PT session. Patient demos good initiation with mobility. Ultimately requires mod +2 assist for bed mobility. Able to sit without external support while holding to bed rail with right UE. Patient able to stand with min/mod +2 assist. Unable to step. Patient requires mod+2 for stand pivot to recliner. She will continue to benefit from skilled PT while here to improve functional independence and strength.         Recommendations for follow up therapy are one component of a multi-disciplinary discharge planning process, led by the attending physician.  Recommendations may be updated based on patient status, additional functional criteria and insurance authorization.  Follow Up Recommendations  Skilled nursing-short term rehab (<3 hours/day)     Assistance Recommended at Discharge Frequent or constant Supervision/Assistance  Equipment Recommendations  None recommended by PT (TBD)    Recommendations for Other Services       Precautions / Restrictions Precautions Precautions: Fall Precaution Comments: L hemi (UE>LE), L field cut Restrictions Weight Bearing Restrictions: No     Mobility  Bed Mobility Overal bed mobility: Needs Assistance Bed Mobility: Supine to Sit     Supine to sit: HOB elevated;Min assist;+2 for physical assistance     General bed mobility comments: increased time, use of bed rail with R UE, use of draw sheet to square hips max assist to scoot forward in bed    Transfers Overall  transfer level: Needs assistance Equipment used: 2 person hand held assist Transfers: Sit to/from Stand;Bed to chair/wheelchair/BSC Sit to Stand: +2 physical assistance;Mod assist Stand pivot transfers: Mod assist;+2 physical assistance         General transfer comment: PT/OT foot and knee blocking, from bed to recliner, arm in arm, then cues to extend  R UE to reach for chair arm rest    Ambulation/Gait               General Gait Details: unable   Stairs             Wheelchair Mobility    Modified Rankin (Stroke Patients Only)       Balance Overall balance assessment: Needs assistance Sitting-balance support: Feet supported Sitting balance-Leahy Scale: Fair   Postural control: Posterior lean;Left lateral lean Standing balance support: During functional activity;Bilateral upper extremity supported Standing balance-Leahy Scale: Poor Standing balance comment: requires +2 for safe standing due to left sided weakness                            Cognition Arousal/Alertness: Awake/alert Behavior During Therapy: WFL for tasks assessed/performed Overall Cognitive Status: Within Functional Limits for tasks assessed                                 General Comments: still with fairly soft voice, but has improved expressive speech. Able to make needs known appropriately.        Exercises      General Comments  Pertinent Vitals/Pain Pain Assessment: No/denies pain Faces Pain Scale: No hurt    Home Living                          Prior Function            PT Goals (current goals can now be found in the care plan section) Acute Rehab PT Goals Patient Stated Goal: increase independence with mobility PT Goal Formulation: With patient Time For Goal Achievement: 02/03/21 Potential to Achieve Goals: Fair Progress towards PT goals: Progressing toward goals    Frequency    7X/week      PT Plan Discharge  plan needs to be updated    Co-evaluation PT/OT/SLP Co-Evaluation/Treatment: Yes Reason for Co-Treatment: For patient/therapist safety;To address functional/ADL transfers PT goals addressed during session: Mobility/safety with mobility;Balance OT goals addressed during session: ADL's and self-care      AM-PAC PT "6 Clicks" Mobility   Outcome Measure  Help needed turning from your back to your side while in a flat bed without using bedrails?: A Lot Help needed moving from lying on your back to sitting on the side of a flat bed without using bedrails?: A Lot Help needed moving to and from a bed to a chair (including a wheelchair)?: A Lot Help needed standing up from a chair using your arms (e.g., wheelchair or bedside chair)?: A Lot Help needed to walk in hospital room?: Total Help needed climbing 3-5 steps with a railing? : Total 6 Click Score: 10    End of Session Equipment Utilized During Treatment: Gait belt Activity Tolerance: Patient tolerated treatment well Patient left: in chair;with call bell/phone within reach Nurse Communication: Mobility status PT Visit Diagnosis: Unsteadiness on feet (R26.81);Muscle weakness (generalized) (M62.81);Hemiplegia and hemiparesis;Other abnormalities of gait and mobility (R26.89) Hemiplegia - Right/Left: Left Hemiplegia - dominant/non-dominant: Non-dominant Hemiplegia - caused by: Cerebral infarction     Time: 1660-6004 PT Time Calculation (min) (ACUTE ONLY): 17 min  Charges:  $Therapeutic Activity: 8-22 mins                     Dionel Archey, PT, GCS 01/24/21,12:51 PM

## 2021-01-24 NOTE — Progress Notes (Signed)
Subjective: Lip swelling improved from yesterday. She is mildly lethargic on exam, but much more interactive. Reports she is trying not to talk to much 2/2 hoarseness and throat pain but is able to demonstrate that she can. Oriented x3. She recognizes me from when I used to be her clinic doctor 3 years ago in [place].  rEEG yesterday - mild DS w/ subtle L frontal focal slowing, no epileptiform abnl  Objective: Current vital signs: BP (!) 163/94    Pulse (!) 106    Temp 97.9 F (36.6 C) (Oral)    Resp 14    Ht 5\' 2"  (1.575 m)    Wt 97.7 kg    SpO2 98%    BMI 39.40 kg/m  Vital signs in last 24 hours: Temp:  [97.9 F (36.6 C)-99 F (37.2 C)] 97.9 F (36.6 C) (12/21 0723) Pulse Rate:  [98-121] 106 (12/21 0723) Resp:  [14-20] 14 (12/21 0723) BP: (141-163)/(76-104) 163/94 (12/21 0723) SpO2:  [98 %-100 %] 98 % (12/21 0723)  Intake/Output from previous day: 12/20 0701 - 12/21 0700 In: 680 [P.O.:680] Out: 350 [Urine:350] Intake/Output this shift: No intake/output data recorded. Nutritional status:  Diet Order             DIET - DYS 1 Room service appropriate? Yes with Assist; Fluid consistency: Nectar Thick  Diet effective now                  HEENT-  [place]/AT. Lip swelling is improved from yesterday  Lungs- Respirations unlabored but there is a faint, hushed stridorous quality to some inspirations Extremities- Warm and well perfused   Neurological Examination Mental Status: Drowsy but much improved from yesterday, oriented x4 Cranial Nerves: PERRL, VFF, EOMI, sensation intact, L UMN facial droop, hearing intact to voice LUE: Flexion contractures at elbow, wrist and digits. Arm adducted at shoulder. Increased tone. Some movement but none against gravity LLE: Increased tone. Falls to bed after passive elevation and release, with minimal effort against gravity. Spontaneous clonus to left foot intermittently. Sustained clonus with passive dorsiflexion by examiner.  RUE, RLE: at  least 4-/5 throughout Cerebellar: mild ataxia on R FNF, UTA on L Gait: Unable to assess.   Lab Results: Results for orders placed or performed during the hospital encounter of 01/19/21 (from the past 48 hour(s))  Glucose, capillary     Status: Abnormal   Collection Time: 01/22/21  1:20 PM  Result Value Ref Range   Glucose-Capillary 266 (H) 70 - 99 mg/dL    Comment: Glucose reference range applies only to samples taken after fasting for at least 8 hours.  Glucose, capillary     Status: Abnormal   Collection Time: 01/22/21  4:29 PM  Result Value Ref Range   Glucose-Capillary 233 (H) 70 - 99 mg/dL    Comment: Glucose reference range applies only to samples taken after fasting for at least 8 hours.  Glucose, capillary     Status: Abnormal   Collection Time: 01/22/21 10:07 PM  Result Value Ref Range   Glucose-Capillary 371 (H) 70 - 99 mg/dL    Comment: Glucose reference range applies only to samples taken after fasting for at least 8 hours.  Glucose, capillary     Status: Abnormal   Collection Time: 01/23/21  7:35 AM  Result Value Ref Range   Glucose-Capillary 207 (H) 70 - 99 mg/dL    Comment: Glucose reference range applies only to samples taken after fasting for at least 8 hours.  Glucose,  capillary     Status: Abnormal   Collection Time: 01/23/21 11:45 AM  Result Value Ref Range   Glucose-Capillary 318 (H) 70 - 99 mg/dL    Comment: Glucose reference range applies only to samples taken after fasting for at least 8 hours.  Glucose, capillary     Status: Abnormal   Collection Time: 01/23/21  4:41 PM  Result Value Ref Range   Glucose-Capillary 350 (H) 70 - 99 mg/dL    Comment: Glucose reference range applies only to samples taken after fasting for at least 8 hours.  Glucose, capillary     Status: Abnormal   Collection Time: 01/23/21 10:37 PM  Result Value Ref Range   Glucose-Capillary 356 (H) 70 - 99 mg/dL    Comment: Glucose reference range applies only to samples taken after  fasting for at least 8 hours.  Glucose, capillary     Status: Abnormal   Collection Time: 01/24/21  8:10 AM  Result Value Ref Range   Glucose-Capillary 278 (H) 70 - 99 mg/dL    Comment: Glucose reference range applies only to samples taken after fasting for at least 8 hours.    Recent Results (from the past 240 hour(s))  Blood Culture (routine x 2)     Status: None (Preliminary result)   Collection Time: 01/19/21  8:50 AM   Specimen: BLOOD  Result Value Ref Range Status   Specimen Description BLOOD BLOOD LEFT FOREARM  Final   Special Requests   Final    BOTTLES DRAWN AEROBIC AND ANAEROBIC Blood Culture results may not be optimal due to an inadequate volume of blood received in culture bottles   Culture   Final    NO GROWTH 4 DAYS Performed at [facility], [address] [place], [place] [address]    Report Status PENDING  Incomplete  Resp Panel by RT-PCR (Flu A&B, Covid) Nasopharyngeal Swab     Status: None   Collection Time: 01/19/21  9:44 AM   Specimen: Nasopharyngeal Swab; Nasopharyngeal(NP) swabs in vial transport medium  Result Value Ref Range Status   SARS Coronavirus 2 by RT PCR NEGATIVE NEGATIVE Final    Comment: (NOTE) SARS-CoV-2 target nucleic acids are NOT DETECTED.  The SARS-CoV-2 RNA is generally detectable in upper respiratory specimens during the acute phase of infection. The lowest concentration of SARS-CoV-2 viral copies this assay can detect is 138 copies/mL. A negative result does not preclude SARS-Cov-2 infection and should not be used as the sole basis for treatment or other patient management decisions. A negative result may occur with  improper specimen collection/handling, submission of specimen other than nasopharyngeal swab, presence of viral mutation(s) within the areas targeted by this assay, and inadequate number of viral copies(<138 copies/mL). A negative result must be combined with clinical observations, patient history, and  epidemiological information. The expected result is Negative.  Fact Sheet for Patients:  EntrepreneurPulse.com.au  Fact Sheet for Healthcare Providers:  IncredibleEmployment.be  This test is no t yet approved or cleared by the [place] FDA and  has been authorized for detection and/or diagnosis of SARS-CoV-2 by FDA under an Emergency Use Authorization (EUA). This EUA will remain  in effect (meaning this test can be used) for the duration of the COVID-19 declaration under Section 564(b)(1) of the Act, 21 U.S.C.section 360bbb-3(b)(1), unless the authorization is terminated  or revoked sooner.       Influenza A by PCR NEGATIVE NEGATIVE Final   Influenza B by PCR NEGATIVE NEGATIVE Final  Comment: (NOTE) The Xpert Xpress SARS-CoV-2/FLU/RSV plus assay is intended as an aid in the diagnosis of influenza from Nasopharyngeal swab specimens and should not be used as a sole basis for treatment. Nasal washings and aspirates are unacceptable for Xpert Xpress SARS-CoV-2/FLU/RSV testing.  Fact Sheet for Patients: EntrepreneurPulse.com.au  Fact Sheet for Healthcare Providers: IncredibleEmployment.be  This test is not yet approved or cleared by the [place] FDA and has been authorized for detection and/or diagnosis of SARS-CoV-2 by FDA under an Emergency Use Authorization (EUA). This EUA will remain in effect (meaning this test can be used) for the duration of the COVID-19 declaration under Section 564(b)(1) of the Act, 21 U.S.C. section 360bbb-3(b)(1), unless the authorization is terminated or revoked.  Performed at [facility], [address] [place], [place] [address]   Culture, blood (Routine X 2) w Reflex to ID Panel     Status: None (Preliminary result)   Collection Time: 01/19/21 10:02 AM   Specimen: BLOOD  Result Value Ref Range Status   Specimen Description BLOOD RIGHT FOA  Final    Special Requests   Final    BOTTLES DRAWN AEROBIC AND ANAEROBIC Blood Culture adequate volume   Culture   Final    NO GROWTH 4 DAYS Performed at [facility], [address] [place],  [address]    Report Status PENDING  Incomplete    Lipid Panel No results for input(s): CHOL, TRIG, HDL, CHOLHDL, VLDL, LDLCALC in the last 72 hours.   Studies/Results: EEG adult  Result Date: 01/22/2021 Derek Jack, MD     01/22/2021  9:15 PM Routine EEG Report Anayah Arvanitis [name] is a 50 y.o. female with a history of strokes and aphasia who is undergoing an EEG to evaluate for seizures. Report: This EEG was acquired with electrodes placed according to the International 10-20 electrode system (including Fp1, Fp2, F3, F4, C3, C4, P3, P4, O1, O2, T3, T4, T5, T6, A1, A2, Fz, Cz, Pz). The following electrodes were missing or displaced: none. The occipital dominant rhythm was 7-8 Hz. This activity is reactive to stimulation. Drowsiness was manifested by background fragmentation; deeper stages of sleep were identified by K complexes and sleep spindles. There was subtle focal slowing over the left frontal region. There were no interictal epileptiform discharges. There were no electrographic seizures identified. There was no abnormal response to photic stimulation. Hyperventilation was not performed. Impression and clinical correlation: This EEG was obtained while awake and asleep and is abnormal due to mild diffuse slowing indicative of global cerebral dysfunction. Subtle left frontal focal slowing indicated superimposed focal dysfunction in that area. Epileptiform abnormalities were not seen during this recording. Su Monks, MD [facility] [phone] If 7pm- 7am, please page neurology on call as listed in [place].    Medications: Scheduled:   stroke: mapping our early stages of recovery book   Does not apply Once   amLODipine  10 mg Oral Daily   aspirin  81 mg Oral Daily    atorvastatin  80 mg Oral Daily   chlorhexidine  15 mL Mouth Rinse BID   Chlorhexidine Gluconate Cloth  6 each Topical Daily   cyclobenzaprine  5 mg Oral TID   diphenhydrAMINE  25 mg Intravenous Q8H   docusate sodium  200 mg Oral QHS   escitalopram  20 mg Oral Daily   gabapentin  200 mg Oral TID   heparin  5,000 Units Subcutaneous Q8H   hydrALAZINE  25 mg Oral Q8H  insulin aspart  0-5 Units Subcutaneous QHS   insulin aspart  0-9 Units Subcutaneous TID WC   insulin glargine-yfgn  23 Units Subcutaneous BID   lisinopril  40 mg Oral Daily   loratadine  10 mg Oral Daily   LORazepam  0.25 mg Oral QHS   mouth rinse  15 mL Mouth Rinse q12n4p   melatonin  10 mg Oral QHS   methylPREDNISolone (SOLU-MEDROL) injection  40 mg Intravenous Daily   metoCLOPramide (REGLAN) injection  10 mg Intravenous Q8H   metoprolol tartrate  100 mg Oral BID   pantoprazole (PROTONIX) IV  40 mg Intravenous Q12H   sodium chloride flush  3 mL Intravenous Once   ticagrelor  90 mg Oral BID   Continuous:   Assessment: 50 year old female presenting with acute onset of mutism and BLE weakness with intermittent vomiting.lamic stroke versus a left perisylvian stroke.  1.. Exams and mental status were fluctuating wrt aphasia. EEG showed no epileptiform abnl. Mental status and aphasia both improved on my examination today. 2. MRI brain performed 01/20/21 shows no acute intracranial pathology. There is moderate parenchymal volume loss and chronic white matter microangiopathy, advanced for age. 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 Multiple small chronic microhemorrhages in a central distribution, likely hypertensive in etiology. 3. CTA of head and neck: Generalized and severe atherosclerosis, especially for age. Most severe abnormalities are in the posterior circulation where there are left vertebral and basilar occlusions which are age  indeterminate, as well as severe right V4 segment stenosis. No flow limiting stenosis in the cervical carotid circulation. Probable flow limiting stenosis at the carotid siphons, especially on the right. 4. Lip swelling likely 2/2 allergic rxn to iodinated contrast or possibly ticlodipine. Pt is on benadryl and solumedrol. 5. Patient was previously a clinic patient of mine 2 years ago in [place], [place]. At that time she told me that she had a hx primary brain malignancy resected yrs ago at [facility]. I will obtain a records release from pt tomorrow if she is able to consent so that we can further clarify this. I would like to get MRI brain wwo contrast but as I recall she has a mild allergy to gad and requires steroid prep for MRI. Given that she is already on solumedrol and benadryl for another allergic rxn this hospital stay, will hold off on this. 6. MRI t spine wo contrast 12/18 unrevealing  Given her brain MRI negative for acute process suspect her presentation may have been 2/2 recrudescence in the setting of GI illness   Recommendations: 1. BP management. Averaging about 503-546 systolic. Goal given her severe stenoses on CTA should be a SBP of 130-150 for now.   2. TTE no intracardiac clot 3. Check Hgb A1xc. BG in 300s since admission (pt is also on solumedrol) 4. Cardiac telemetry 5. Frequent neuro checks 6. PT/OT/Speech 7. 80 mg atorvastatin   8. ASA 81 mg po qd. Has an allergy to clopidogrel. 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    9. IVF 10. Monitor her lip swelling closely. Also monitor breathing, breath sounds especially in the neck region and continuous pulse ox. Respiratory therapy for testing of NIF and FVC q12h.   11. Obtained records release and faxed to [facility]  Will continue to follow.  Su Monks, MD [facility] [phone]  If 7pm- 7am, please page neurology on call as listed in [place].

## 2021-01-24 NOTE — Progress Notes (Signed)
Patient reporting nausea. Not wanting to take medications at this time. Zofran given IV. I will try to give morning medications when patient is feeling better

## 2021-01-24 NOTE — Progress Notes (Signed)
Patient ID: Lori [name], female   DOB: [birth year], 50 y.o.   MRN: [id number] [facility] PROGRESS NOTE  Lori [name] ZHG:[id number] DOB: 24-May-1970 DOA: 01/19/2021 PCP: Pcp, No  HPI/Subjective: Patient with a little nausea this morning.  Received Zofran and needed IV Phenergan after that.  Still having nausea and vomited up meds.  Admitted with suspected stroke with aphasia and weakness.  Objective: Vitals:   01/24/21 0723 01/24/21 1211  BP: (!) 163/94 (!) 164/116  Pulse: (!) 106 (!) 110  Resp: 14 16  Temp: 97.9 F (36.6 C) 97.7 F (36.5 C)  SpO2: 98% 100%    Intake/Output Summary (Last 24 hours) at 01/24/2021 1459 Last data filed at 01/24/2021 1418 Gross per 24 hour  Intake 200 ml  Output 1200 ml  Net -1000 ml   Filed Weights   01/19/21 0838 01/20/21 0049  Weight: 102.6 kg 97.7 kg    ROS: Review of Systems  Respiratory:  Negative for shortness of breath.   Cardiovascular:  Negative for chest pain.  Gastrointestinal:  Positive for constipation, nausea and vomiting. Negative for abdominal pain.  Exam: Physical Exam HENT:     Head: Normocephalic.     Mouth/Throat:     Pharynx: No oropharyngeal exudate.  Eyes:     General: Lids are normal.     Conjunctiva/sclera: Conjunctivae normal.     Comments: Difficulty with moving eyes to the left and holding it there.  Cardiovascular:     Rate and Rhythm: Regular rhythm. Tachycardia present.     Heart sounds: Normal heart sounds, S1 normal and S2 normal.  Pulmonary:     Breath sounds: No decreased breath sounds, wheezing, rhonchi or rales.  Abdominal:     Palpations: Abdomen is soft.     Tenderness: There is no abdominal tenderness.  Musculoskeletal:     Right lower leg: Swelling present.     Left lower leg: Swelling present.  Skin:    General: Skin is warm.     Findings: No rash.  Neurological:     Mental Status: She is alert and oriented to person, place, and time.     Comments: Barely able to lift  either leg up off the chair.  Cannot see to the left.      Scheduled Meds:   stroke: mapping our early stages of recovery book   Does not apply Once   (feeding supplement) PROSource Plus  30 mL Oral BID BM   amLODipine  10 mg Oral Daily   aspirin  81 mg Oral Daily   atorvastatin  80 mg Oral Daily   chlorhexidine  15 mL Mouth Rinse BID   Chlorhexidine Gluconate Cloth  6 each Topical Daily   cyclobenzaprine  5 mg Oral TID   diphenhydrAMINE  25 mg Intravenous Q8H   docusate sodium  200 mg Oral QHS   escitalopram  20 mg Oral Daily   feeding supplement  1 Container Oral TID BM   gabapentin  200 mg Oral TID   heparin  5,000 Units Subcutaneous Q8H   hydrALAZINE  25 mg Oral Q8H   insulin aspart  0-5 Units Subcutaneous QHS   insulin aspart  0-9 Units Subcutaneous TID WC   insulin glargine-yfgn  23 Units Subcutaneous BID   lisinopril  40 mg Oral Daily   loratadine  10 mg Oral Daily   LORazepam  0.25 mg Oral QHS   mouth rinse  15 mL Mouth Rinse q12n4p   melatonin  10 mg  Oral QHS   metoCLOPramide (REGLAN) injection  10 mg Intravenous Q8H   metoprolol tartrate  100 mg Oral BID   multivitamin with minerals  1 tablet Oral Daily   pantoprazole (PROTONIX) IV  40 mg Intravenous Q12H   polyethylene glycol  17 g Oral Daily   sodium chloride flush  3 mL Intravenous Once   ticagrelor  90 mg Oral BID     Assessment/Plan:  Suspected acute stroke even though MRI of the brain negative for acute infarct.  Patient with aphasia and weakness bilaterally and intermittent vomiting.  Patient on aspirin, Brilinta and atorvastatin.  LDL 95.  We will repeat CT scan of the head Persist nausea vomiting today.  Potentially could be diabetic gastroparesis.  On IV Reglan.  We will give a dose of IV Ativan.  Received 1 dose of Phenergan earlier and is on Zofran.  We will get a abdominal x-ray flat and upright. Lip swelling.  Completed Solu-Medrol.  As needed IV Benadryl. Sinus tachycardia on metoprolol Type 2  diabetes mellitus with hyperlipidemia.  Continue atorvastatin.  On glargine insulin 23 units twice a day.  With stopping of steroids hopefully the sugars will come down.  We will add short acting insulin prior to meals Accelerated hypertension with a nausea vomiting today.  Having trouble stomach and oral medications. 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  Urinalysis on the 16th was yellow and not brown.  I doubt colovaginal fistula Weakness.  Physical therapy recommends rehab.  Transitional care team to look into options.        Code Status:     Code Status Orders  (From admission, onward)           Start     Ordered   01/19/21 1218  Full code  Continuous        01/19/21 1218           Code Status History     This patient has a current code status but no historical code status.      Family Communication: Left message for daughter Disposition Plan: Status is: [place]  [facility]

## 2021-01-24 NOTE — Progress Notes (Signed)
Inpatient Diabetes Program Recommendations  AACE/ADA: New Consensus Statement on Inpatient Glycemic Control  Target Ranges:  Prepandial:   less than 140 mg/dL      Peak postprandial:   less than 180 mg/dL (1-2 hours)      Critically ill patients:  140 - 180 mg/dL    Latest Reference Range & Units 01/23/21 07:35 01/23/21 11:45 01/23/21 16:41 01/23/21 22:37 01/24/21 08:10  Glucose-Capillary 70 - 99 mg/dL 207 (H) 318 (H) 350 (H) 356 (H) 278 (H)   Review of Glycemic Control  Diabetes history: DM2 Outpatient Diabetes medications: Levemir 14 units BID, Novolog 0-10 units TID with meals Current orders for Inpatient glycemic control: Semglee 23 units BID, Novolog 0-9 units TID with meals, Novolog 0-5 units QHS; Solumedrol 40 mg daily   Inpatient Diabetes Program Recommendations:     Insulin: Please consider increasing Semglee to 26 units BID and ordering Novolog 4 units TID with meals for meal coverage if patient eats at least 50% of meals.   Thanks, Barnie Alderman, RN, MSN, CDE Diabetes Coordinator Inpatient Diabetes Program [phone] (Team Pager from 8am to 5pm)

## 2021-01-24 NOTE — Discharge Instructions (Signed)

## 2021-01-24 NOTE — Plan of Care (Signed)
  Problem: Health Behavior/Discharge Planning: Goal: Ability to manage health-related needs will improve Outcome: Progressing   

## 2021-01-25 DIAGNOSIS — R000 Tachycardia, unspecified: Secondary | ICD-10-CM | POA: Diagnosis not present

## 2021-01-25 DIAGNOSIS — R112 Nausea with vomiting, unspecified: Secondary | ICD-10-CM | POA: Diagnosis not present

## 2021-01-25 DIAGNOSIS — I639 Cerebral infarction, unspecified: Secondary | ICD-10-CM | POA: Diagnosis not present

## 2021-01-25 DIAGNOSIS — I10 Essential (primary) hypertension: Secondary | ICD-10-CM | POA: Diagnosis not present

## 2021-01-25 LAB — BASIC METABOLIC PANEL
Anion gap: 6 (ref 5–15)
BUN: 17 mg/dL (ref 6–20)
CO2: 27 mmol/L (ref 22–32)
Calcium: 8.5 mg/dL — ABNORMAL LOW (ref 8.9–10.3)
Chloride: 104 mmol/L (ref 98–111)
Creatinine, Ser: 0.75 mg/dL (ref 0.44–1.00)
GFR, Estimated: >60 mL/min (ref >60)
Glucose, Bld: 210 mg/dL — ABNORMAL HIGH (ref 70–99)
Potassium: 3.9 mmol/L (ref 3.5–5.1)
Sodium: 137 mmol/L (ref 135–145)

## 2021-01-25 LAB — CBC
HCT: 37.2 % (ref 36.0–46.0)
Hemoglobin: 12.3 g/dL (ref 12.0–15.0)
MCH: 26 pg (ref 26.0–34.0)
MCHC: 33.1 g/dL (ref 30.0–36.0)
MCV: 78.6 fL — ABNORMAL LOW (ref 80.0–100.0)
Platelets: 307 10*3/uL (ref 150–400)
RBC: 4.73 MIL/uL (ref 3.87–5.11)
RDW: 14.4 % (ref 11.5–15.5)
WBC: 15.7 10*3/uL — ABNORMAL HIGH (ref 4.0–10.5)
nRBC: 0 % (ref 0.0–0.2)

## 2021-01-25 LAB — GLUCOSE, CAPILLARY
Glucose-Capillary: 202 mg/dL — ABNORMAL HIGH (ref 70–99)
Glucose-Capillary: 280 mg/dL — ABNORMAL HIGH (ref 70–99)
Glucose-Capillary: 231 mg/dL — ABNORMAL HIGH (ref 70–99)
Glucose-Capillary: 175 mg/dL — ABNORMAL HIGH (ref 70–99)
Glucose-Capillary: 176 mg/dL — ABNORMAL HIGH (ref 70–99)

## 2021-01-25 MED ORDER — INSULIN GLARGINE-YFGN 100 UNIT/ML ~~LOC~~ SOLN
26.0000 [IU] | Freq: Two times a day (BID) | SUBCUTANEOUS | Status: DC
Start: 2021-01-25 — End: 2021-01-26
  Administered 2021-01-25 – 2021-01-26 (×2): 26 [IU] via SUBCUTANEOUS
  Filled 2021-01-25 (×5): qty 0.26

## 2021-01-25 MED ORDER — LACTULOSE 10 GM/15ML PO SOLN
20.0000 g | Freq: Once | ORAL | Status: DC
  Filled 2021-01-25: qty 30

## 2021-01-25 MED ORDER — KATE FARMS STANDARD 1.4 PO LIQD
325.0000 mL | Freq: Three times a day (TID) | ORAL | Status: DC
  Administered 2021-01-25 – 2021-01-29 (×6): 325 mL via ORAL
  Filled 2021-01-25: qty 325

## 2021-01-25 NOTE — Progress Notes (Signed)
Occupational Therapy Treatment Patient Details Name: Lori [name] MRN: [id number] DOB: 07/21/1970 Today's Date: 01/25/2021   History of present illness Pt is a 50 y/o F admitted on 01/19/21 after presenting with c/c of aphasia, N&V. MRI shows no acute intracranial pathology.Marland Kitchen PMH: HTN, HLD, DM, stroke with L sided weakness, thyroid disease, depression with anxiety, CAD s/p CABG, pheochromocytoma (s/p R adrenal gland removal)   OT comments  Pt seen for OT/PT co-tx to maximize services. She requires MOD A to come to EOB sitting and OT engages pt in static sitting balance tasks to improve trunk control for seated ADLs. Pt requires MIN A for oral care in unsupported sitting. OT also engages pt in gentle AAROM of L UE to improve elasticity of connective tissue. Pt able to complete SPS with PT/OT b/l knee blocking, arm in arm with gait belt, towards her right to recliner. Left with all needs met and in reach. Will continue to follow.    Recommendations for follow up therapy are one component of a multi-disciplinary discharge planning process, led by the attending physician.  Recommendations may be updated based on patient status, additional functional criteria and insurance authorization.    Follow Up Recommendations  Acute inpatient rehab (3hours/day)    Assistance Recommended at Discharge    Equipment Recommendations  Other (comment) (defer)    Recommendations for Other Services Rehab consult    Precautions / Restrictions Precautions Precautions: Fall Precaution Comments: L hemi (UE>LE), L field cut Restrictions Weight Bearing Restrictions: No       Mobility Bed Mobility Overal bed mobility: Needs Assistance Bed Mobility: Supine to Sit Rolling: Mod assist   Supine to sit: HOB elevated;Min assist;+2 for physical assistance Sit to supine: Max assist;HOB elevated   General bed mobility comments: PTA/OT asisted pt to EOB however author assisted pt +1 with returning to supine  from EOB after pt sat in recliner x ~ 1.5 hours    Transfers Overall transfer level: Needs assistance Equipment used: 2 person hand held assist Transfers: Sit to/from Stand;Bed to chair/wheelchair/BSC Sit to Stand: Min assist;+2 physical assistance;+2 safety/equipment Stand pivot transfers: Min assist;+2 physical assistance         General transfer comment: Pt was able to transfers from EOB to recliner with + 2 min assist. Chief Strategy Officer returned ~ 1.5 hours later and assisted pt back to bed with pt using bed rail and +1 assistance. she stood pivot with mod-max of one. Increased time to perform but pt was able with vcs and +1 assistance     Balance Overall balance assessment: Needs assistance Sitting-balance support: Feet supported Sitting balance-Leahy Scale: Fair Sitting balance - Comments: CGA for sitting balance, sometimes SUPV only, cues to sustain, worked on multi-tasking (ADLs while controlling istting balance) Postural control: Posterior lean;Left lateral lean Standing balance support: During functional activity;Bilateral upper extremity supported Standing balance-Leahy Scale: Poor Standing balance comment: requires +2 for safe standing due to left sided weakness                           ADL either performed or assessed with clinical judgement   ADL Overall ADL's : Needs assistance/impaired     Grooming: Oral care;Minimal assistance Grooming Details (indicate cue type and reason): seated EOB, cues to maintain static sitting balance  Extremity/Trunk Assessment              Vision       Perception     Praxis      Cognition Arousal/Alertness: Lethargic;Suspect due to medications Behavior During Therapy: [facility] for tasks assessed/performed;Flat affect Overall Cognitive Status: Within Functional Limits for tasks assessed                                 General Comments: per OT who was  co-treating with, pt much less verbal and more lethargic than previously observed. Question if due to benydril. Able to follow commands, increased processing time          Exercises     Shoulder Instructions       General Comments      Pertinent Vitals/ Pain       Pain Assessment: No/denies pain Faces Pain Scale: No hurt  Home Living                                          Prior Functioning/Environment              Frequency  Min 3X/week        Progress Toward Goals  OT Goals(current goals can now be found in the care plan section)  Progress towards OT goals: Progressing toward goals  Acute Rehab OT Goals Patient Stated Goal: to get stronger OT Goal Formulation: With patient Time For Goal Achievement: 02/03/21 Potential to Achieve Goals: Good  Plan Discharge plan remains appropriate    Co-evaluation    PT/OT/SLP Co-Evaluation/Treatment: Yes Reason for Co-Treatment: Complexity of the patient's impairments (multi-system involvement);For patient/therapist safety PT goals addressed during session: Mobility/safety with mobility OT goals addressed during session: ADL's and self-care      AM-PAC OT "6 Clicks" Daily Activity     Outcome Measure   Help from another person eating meals?: A Little Help from another person taking care of personal grooming?: A Little Help from another person toileting, which includes using toliet, bedpan, or urinal?: A Lot Help from another person bathing (including washing, rinsing, drying)?: A Lot Help from another person to put on and taking off regular upper body clothing?: A Lot Help from another person to put on and taking off regular lower body clothing?: Total 6 Click Score: 13    End of Session Equipment Utilized During Treatment: Gait belt  OT Visit Diagnosis: Unsteadiness on feet (R26.81);Muscle weakness (generalized) (M62.81);Other symptoms and signs involving the nervous system  (R29.898);Hemiplegia and hemiparesis Hemiplegia - Right/Left: Left Hemiplegia - dominant/non-dominant: Non-Dominant Hemiplegia - caused by: Cerebral infarction   Activity Tolerance Patient tolerated treatment well   Patient Left with call bell/phone within reach;in chair;with chair alarm set   Nurse Communication Mobility status        Time: 2244-9753 OT Time Calculation (min): 31 min  Charges: OT General Charges $OT Visit: 1 Visit OT Treatments $Self Care/Home Management : 8-22 mins  Gerrianne Scale, [place], OTR/L ascom [phone] 01/25/21, 5:18 PM

## 2021-01-25 NOTE — Progress Notes (Signed)
Brief Nutrition Note  Spoke with SLP over the phone.  SLP moved diet to Dysphagia 3 with nectar thickened liquids.   RD to discontinue Boost Breeze and ProSource Plus supplements and add [employer] 1.4 TID.  Thicken [place] supplements to appropriate consistency.  Pt allowed to have baked potato, sweet potato, chopped pineapple, fish, and tuna salad per SLP.  Pt does not eat most meat and dairy products.  RD to note this in [facility] as well.  Derrel Nip, RD, LDN (she/her/hers) Clinical Inpatient Dietitian RD Pager/After-Hours/Weekend Pager # in [place]

## 2021-01-25 NOTE — Plan of Care (Signed)
Brief Neurology Note  Patient stable from neurologic standpoint. Awaiting medical records from [facility] to clarify her history of brain tumor. Will f/u tomorrow after I receive them. Please let me know if any neurologic concerns arise in the interim.  Su Monks, MD [facility] [phone]  If 7pm- 7am, please page neurology on call as listed in [place].

## 2021-01-25 NOTE — Progress Notes (Signed)
Patient ID: Lori [name], female   DOB: 05/24/1970, 50 y.o.   MRN: [id number] [facility] PROGRESS NOTE  Lori [name] BOF:[id number] DOB: January 15, 1971 DOA: 01/19/2021 PCP: Pcp, No  HPI/Subjective: Patient still with some constipation.  Doing better with the diet today than yesterday.  Yesterday had a lot of nausea vomiting.  No abdominal pain.  Asking to be advanced on her diet.  Admitted with stroke.  Objective: Vitals:   01/25/21 1201 01/25/21 1544  BP: 123/86 129/86  Pulse: 89 97  Resp: 18 18  Temp: 98.2 F (36.8 C) 97.9 F (36.6 C)  SpO2: 100% 95%    Intake/Output Summary (Last 24 hours) at 01/25/2021 1553 Last data filed at 01/25/2021 0522 Gross per 24 hour  Intake 250 ml  Output 650 ml  Net -400 ml   Filed Weights   01/19/21 0838 01/20/21 0049  Weight: 102.6 kg 97.7 kg    ROS: Review of Systems  Respiratory:  Negative for shortness of breath.   Cardiovascular:  Negative for chest pain.  Gastrointestinal:  Positive for constipation and nausea. Negative for abdominal pain and vomiting.  Exam: Physical Exam HENT:     Head: Normocephalic.     Mouth/Throat:     Pharynx: No oropharyngeal exudate.  Eyes:     General: Lids are normal.     Conjunctiva/sclera: Conjunctivae normal.  Cardiovascular:     Rate and Rhythm: Normal rate and regular rhythm.     Heart sounds: Normal heart sounds, S1 normal and S2 normal.  Pulmonary:     Breath sounds: No decreased breath sounds, wheezing, rhonchi or rales.  Abdominal:     Palpations: Abdomen is soft.     Tenderness: There is no abdominal tenderness.  Musculoskeletal:     Right lower leg: Swelling present.     Left lower leg: Swelling present.  Skin:    General: Skin is warm.     Findings: No rash.  Neurological:     Mental Status: She is alert and oriented to person, place, and time.     Comments: Left arm weakness.  Difficulty with straight leg raise.      Scheduled Meds:   stroke: mapping our  early stages of recovery book   Does not apply Once   amLODipine  10 mg Oral Daily   aspirin  81 mg Oral Daily   atorvastatin  80 mg Oral Daily   chlorhexidine  15 mL Mouth Rinse BID   Chlorhexidine Gluconate Cloth  6 each Topical Daily   cyclobenzaprine  5 mg Oral TID   diphenhydrAMINE  25 mg Intravenous Q8H   docusate sodium  200 mg Oral QHS   escitalopram  20 mg Oral Daily   feeding supplement (KATE FARMS STANDARD 1.4)  325 mL Oral TID BM   gabapentin  200 mg Oral TID   heparin  5,000 Units Subcutaneous Q8H   hydrALAZINE  25 mg Oral Q8H   insulin aspart  0-5 Units Subcutaneous QHS   insulin aspart  0-9 Units Subcutaneous TID WC   insulin aspart  4 Units Subcutaneous TID WC   insulin glargine-yfgn  23 Units Subcutaneous BID   lactulose  20 g Oral Once   lisinopril  40 mg Oral Daily   loratadine  10 mg Oral Daily   LORazepam  0.5 mg Intravenous Once   LORazepam  0.25 mg Oral QHS   mouth rinse  15 mL Mouth Rinse q12n4p   melatonin  10 mg Oral QHS  metoCLOPramide (REGLAN) injection  10 mg Intravenous Q8H   metoprolol tartrate  100 mg Oral BID   multivitamin with minerals  1 tablet Oral Daily   pantoprazole (PROTONIX) IV  40 mg Intravenous Q12H   polyethylene glycol  17 g Oral Daily   sodium chloride flush  3 mL Intravenous Once   ticagrelor  90 mg Oral BID     Assessment/Plan:  Suspected acute stroke even though MRI of the brain was negative for acute infarct.  Patient has aphasia and weakness of bilateral legs.  Chronic weakness left arm.  Cannot see very well to the left.  Repeat CT scan did not show any acute event.  LDL 95.  Patient on aspirin and Brilinta and atorvastatin.  Neurology awaiting records to review prior brain tumor history. Nausea vomiting yesterday.  On IV Reglan.  Abdominal flat and upright negative.  We will give 1 dose of lactulose for constipation today. Lip swelling.  Completed Solu-Medrol.  On as needed Benadryl. Sinus tachycardia on metoprolol. Type  2 diabetes mellitus with hyperlipidemia.  Continue atorvastatin.  Increase glargine insulin 26 units twice a day with sugars still in the 200s. Essential hypertension.  On lisinopril and metoprolol. Prior history of pheochromocytoma status post removal right adrenal gland Depression on Lexapro Weakness.  Physical therapy recommending rehab.  Transitional care team looking into options.     Code Status:     Code Status Orders  (From admission, onward)           Start     Ordered   01/19/21 1218  Full code  Continuous        01/19/21 1218           Code Status History     This patient has a current code status but no historical code status.       Disposition Plan: Status is: Inpatient  Case discussed with nursing staff and transitional care team  [facility]  [facility]

## 2021-01-25 NOTE — Progress Notes (Addendum)
Speech Language Pathology Treatment: Dysphagia  Patient Details Name: Lori [name] MRN: [id number] DOB: 01-Sep-1970 Today's Date: 01/25/2021 Time: 0947-0962 SLP Time Calculation (min) (ACUTE ONLY): 50 min  Assessment / Plan / Recommendation Clinical Impression  Per MRI: "No acute intracranial pathology. 2. Moderate parenchymal volume loss and chronic white matter microangiopathy, advanced for age. 3. 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 4. Multiple small chronic microhemorrhages in a central distribution, likely hypertensive in etiology.  Pt seen for ongoing assessment of swallowing. She is alert, verbally responsive and engaged in conversation w/ SLP. Pt is on RA; wbc wnl. Missing Dentition.   Pt is able to converse in general conversation, answer general questions re: self, and she requests certain food items stating she eats a "no animal product" diet. Suspect pt could have some mild/generalized Cognitive-communication decline w/ more complex executive functioning tasks d/t Results of her MRI -- see above. If any concern, she can have f/u at SNF post discharge. Pt has resided long-term at a SNF for past year per report/chart.   Discussed w/ pt's her results of her MBSS, aspiration w/ thin liquids (SILENT), and risk for aspiration pnuemonia thus Pulmonary decline/impact especially in light of her sedentary status. Discussed results including: "pt exhibited MOD+ delayed pharyngeal swallow initiation w/ all consistencies(unsure if impact from previous R CVA). This resulted in a decrease in timely and effective airway closure(epiglottic inversion) w/ Aspiration of thin liquids noted fairly consistently. The Aspiration was initially SILENT w/ a much delaed cough response. Trace+ laryngeal Penetration of Nectar consistnecy liquids noted x1 w/ trace residue material along underneath side of the epiglottis  remaining post initial swallow - No Aspiration of Nectar liquids occurred during this study. Reduced laryngeal excursion and anterior movement noted w/ min valleculae and BOT residue remaining post initial swallow w/ all consistencies"; see report for full results. Pt explained general aspiration precautions and agreed verbally to the need for following them especially sitting upright for all oral intake -- supported behind the back for full upright sitting. Pt assisted w/ positioning d/t overall motor weakness which could be heard in her speech and seen in her self-feeding also. She continued to feed herself Lunch meal items of chopped broccoli and diced peaches. She consumed few sips of Nectar liquids via straw. No immediate, overt clinical s/s of aspiration were noted w/ po intake; respiratory status remained calm and unlabored, vocal quality clear b/t trials though low in volume. However, pt exhibited significant, overt coughing when attempting a Nectar consistency liquid medication (w/ nsg present) x1 -- she stated it "did not taste good at all". This may have impacted the timing of the swallow. Noted pt coughed x1 w/ Nectar tea after that -- unsure if still related to earlier, suspected aspiration event w/ med. Oral phase appeared grossly adequate for bolus management and eventual oral clearing. Oral phase was c/b increased mastication time/effort using anterior Dentition, w/ slow oral motor manipulation and oral clearing noted -- suspect impacted by her overall motor weakness. Education and instruction given on moistening foods well, small bites/sips, alternating foods/liquids to aid clearing, lingual sweeping to aid oral clearing.   Dietician consulted d/t pt's stated restrictions in her diet -- she described she ate a more Vegan diet.   Pt presents w/ increased risk for aspiration secondary to oropharyngeal phase dysphagia -- see MBSS report. When following aspiration precautions and strategies, pt  appears to be able to manage a mech soft diet  consistency w/ Nectar liquids w/ reduced risks. Recommend a mech soft diet w/ gravies added to moisten foods(to also encourage oral intake); Nectar consistency liquids. Recommend aspiration precautions; Pills in Puree w/ NSG; tray setup and positioning assistance for meals. REFLUX precautions. ST services will continue to monitor while admitted w/ ST services to f/u at Discharge for ongoing dysphagia therapy. Recommend f/u MBSS in next ~4 weeks to assess appropriateness for diet upgrade back to thin liquids. Recommend f/u w/ Palliative Care services for discussion of pt's [place]. NSG updated. Precautions posted at bedside.        HPI HPI: Per Hospitalist note, "50 y.o. female with medical history significant of hypertension, hyperlipidemia, diabetes mellitus, stroke with left-sided weakness, thyroid disease (patient does not know which type of thyroid disease), depression with anxiety, CAD, s/p of CABG, pheochromocytoma (s/p of right adrenal gland removal per patient), who presents with aphasia, nausea and vomiting.  Patient has history of stroke with left-sided weakness.  At her normal baseline, patient is able to perform ADLs, typically independently with assistance of a walker.  Patient was last known normal at 10 PM last night 01/18/21. Today she started having difficult speaking at about 6 AM.  She also reports bilateral leg weakness, cannot walk due to weakness.  Patient denies chest pain, cough, shortness of breath.  No fever or chills.  No symptoms of UTI.  She states that she has nausea, and has vomited at least 8 times with bilious and greenish colored vomitus.  Denies diarrhea or abdominal pain.  Patient was found to have sinus tachycardia with heart rate up to 160s, patient was given IV metoprolol in the ED.  Heart rate improved to 120s. Heart rate has improved however patient remains tachycardic and hypertensive.  She is mentating clearly but does have  significant soft tissue neck swelling.  Allergic reaction but unknown trigger.  Patient attributes it to known iodine contrast allergy.  Maintaining airway with clear lungs.".    Per Neurology report of EEG: "Impression and clinical correlation: This EEG was obtained while awake and asleep and is abnormal due to mild diffuse slowing indicative of global cerebral dysfunction. Subtle left frontal focal slowing indicated superimposed focal dysfunction in that area.".   CXR at admit: "No active disease.".      SLP Plan  Continue with current plan of care      Recommendations for follow up therapy are one component of a multi-disciplinary discharge planning process, led by the attending physician.  Recommendations may be updated based on patient status, additional functional criteria and insurance authorization.    Recommendations  Diet recommendations: Dysphagia 3 (mechanical soft);Nectar-thick liquid Liquids provided via: Cup;Straw (pt prefers straw use - chronic) Medication Administration: Crushed with puree (for safer swallowing) Supervision: Patient able to self feed;Staff to assist with self feeding;Intermittent supervision to cue for compensatory strategies (education) Compensations: Minimize environmental distractions;Slow rate;Small sips/bites;Lingual sweep for clearance of pocketing;Multiple dry swallows after each bite/sip;Follow solids with liquid;Clear throat intermittently (for any potential laryngeal penetration) Postural Changes and/or Swallow Maneuvers: Out of bed for meals;Seated upright 90 degrees;Upright 30-60 min after meal                General recommendations:  (Palliative Care consult for [place]) Oral Care Recommendations: Oral care BID;Oral care before and after PO;Patient independent with oral care (staff support) Follow Up Recommendations: Skilled nursing-short term rehab (<3 hours/day) Assistance recommended at discharge: Frequent or constant  Supervision/Assistance SLP Visit Diagnosis: Dysphagia, oropharyngeal phase (R13.12) Plan: Continue  with current plan of care             Orinda Kenner, [place], [place] Speech Language Pathologist Rehab Services [phone]  [facility]  01/25/2021, 5:08 PM

## 2021-01-25 NOTE — Progress Notes (Signed)
Physical Therapy Treatment Patient Details Name: Lori [name] MRN: [id number] DOB: Jul 22, 1970 Today's Date: 01/25/2021   History of Present Illness Pt is a 50 y/o F admitted on 01/19/21 after presenting with c/c of aphasia, N&V. MRI shows no acute intracranial pathology.Marland Kitchen PMH: HTN, HLD, DM, stroke with L sided weakness, thyroid disease, depression with anxiety, CAD s/p CABG, pheochromocytoma (s/p R adrenal gland removal)    PT Comments    PT /OT co treat due to pt requiring +2 assistance and poor activity tolerance. Author did return ~ 1  hour after co-treat and was able to assist pt back to bed with only +1 assistance. Pt is somewhat lethargic during session however remained cooperative and pleasant. She agreed to OOB activity and was able to exit L side of bed with min assist +2. Was able to stand pivot to recliner with +2 assistance however she did return to bed from recliner with +1 assistance. Pt used bed rail and was able to stand pivot towards R with use of RUE holding bed rail. She was able to take several steps to turn and pivot towards EOB.  Overall pt is demonstrating improvements. She will greatly benefit form SNF at DC to continue skilled therapy while address deficits with mobility, transfers, and gait.    Recommendations for follow up therapy are one component of a multi-disciplinary discharge planning process, led by the attending physician.  Recommendations may be updated based on patient status, additional functional criteria and insurance authorization.  Follow Up Recommendations  Skilled nursing-short term rehab (<3 hours/day)     Assistance Recommended at Discharge Frequent or constant Supervision/Assistance  Equipment Recommendations  None recommended by PT       Precautions / Restrictions Precautions Precautions: Fall Precaution Comments: L hemi (UE>LE), L field cut Restrictions Weight Bearing Restrictions: No     Mobility  Bed Mobility Overal bed  mobility: Needs Assistance Bed Mobility: Supine to Sit Rolling: Mod assist   Supine to sit: HOB elevated;Min assist;+2 for physical assistance Sit to supine: Max assist;HOB elevated   General bed mobility comments: PTA/OT asisted pt to EOB however author assisted pt +1 with returning to supine from EOB after pt sat in recliner x ~ 1.5 hours    Transfers Overall transfer level: Needs assistance Equipment used: 2 person hand held assist Transfers: Sit to/from Stand;Bed to chair/wheelchair/BSC Sit to Stand: Min assist;+2 physical assistance;+2 safety/equipment Stand pivot transfers: Min assist;+2 physical assistance         General transfer comment: Pt was able to transfers from EOB to recliner with + 2 min assist. Chief Strategy Officer returned ~ 1.5 hours later and assisted pt back to bed with pt using bed rail and +1 assistance. she stood pivot with mod-max of one. Increased time to perform but pt was able with vcs and +1 assistance    Ambulation/Gait   General Gait Details: unable/ unsafe at this time     Balance Overall balance assessment: Needs assistance Sitting-balance support: Feet supported Sitting balance-Leahy Scale: Fair     Standing balance support: During functional activity;Bilateral upper extremity supported Standing balance-Leahy Scale: Poor     Cognition Arousal/Alertness: Lethargic;Suspect due to medications Behavior During Therapy: [facility] for tasks assessed/performed;Flat affect Overall Cognitive Status: Within Functional Limits for tasks assessed    General Comments: per OT who was co-treating with, pt much less verbal and more lethargic than previously observed. Question if due to benydril  Pertinent Vitals/Pain Pain Assessment: No/denies pain Faces Pain Scale: No hurt     PT Goals (current goals can now be found in the care plan section) Acute Rehab PT Goals Patient Stated Goal: go to rehab then home Progress towards PT goals: Progressing  toward goals    Frequency    7X/week      PT Plan Current plan remains appropriate    Co-evaluation     PT goals addressed during session: Mobility/safety with mobility;Balance        AM-PAC PT "6 Clicks" Mobility   Outcome Measure  Help needed turning from your back to your side while in a flat bed without using bedrails?: A Lot Help needed moving from lying on your back to sitting on the side of a flat bed without using bedrails?: A Lot Help needed moving to and from a bed to a chair (including a wheelchair)?: A Lot Help needed standing up from a chair using your arms (e.g., wheelchair or bedside chair)?: A Lot Help needed to walk in hospital room?: Total Help needed climbing 3-5 steps with a railing? : Total 6 Click Score: 10    End of Session Equipment Utilized During Treatment: Gait belt Activity Tolerance: Patient tolerated treatment well Patient left: with call bell/phone within reach;in chair;with chair alarm set Nurse Communication: Mobility status PT Visit Diagnosis: Unsteadiness on feet (R26.81);Muscle weakness (generalized) (M62.81);Hemiplegia and hemiparesis;Other abnormalities of gait and mobility (R26.89) Hemiplegia - Right/Left: Left Hemiplegia - dominant/non-dominant: Non-dominant Hemiplegia - caused by: Cerebral infarction     Time: 3893-7342 PT Time Calculation (min) (ACUTE ONLY): 25 min  Charges:  $Therapeutic Activity: 8-22 mins                     Julaine Fusi PTA 01/25/21, 5:11 PM

## 2021-01-26 DIAGNOSIS — R112 Nausea with vomiting, unspecified: Secondary | ICD-10-CM | POA: Diagnosis not present

## 2021-01-26 DIAGNOSIS — K5900 Constipation, unspecified: Secondary | ICD-10-CM

## 2021-01-26 DIAGNOSIS — I639 Cerebral infarction, unspecified: Secondary | ICD-10-CM | POA: Diagnosis not present

## 2021-01-26 DIAGNOSIS — E1169 Type 2 diabetes mellitus with other specified complication: Secondary | ICD-10-CM | POA: Diagnosis not present

## 2021-01-26 LAB — GLUCOSE, CAPILLARY
Glucose-Capillary: 95 mg/dL (ref 70–99)
Glucose-Capillary: 154 mg/dL — ABNORMAL HIGH (ref 70–99)
Glucose-Capillary: 101 mg/dL — ABNORMAL HIGH (ref 70–99)
Glucose-Capillary: 176 mg/dL — ABNORMAL HIGH (ref 70–99)

## 2021-01-26 MED ORDER — MAGIC MOUTHWASH W/LIDOCAINE
15.0000 mL | Freq: Four times a day (QID) | ORAL | Status: DC
  Administered 2021-01-26 – 2021-02-02 (×8): 15 mL via ORAL
  Filled 2021-01-26 (×32): qty 15

## 2021-01-26 MED ORDER — LACTULOSE 10 GM/15ML PO SOLN
30.0000 g | Freq: Two times a day (BID) | ORAL | Status: DC
  Administered 2021-01-26: 30 g via ORAL
  Filled 2021-01-26 (×3): qty 60

## 2021-01-26 MED ORDER — SODIUM CHLORIDE 0.9 % IV BOLUS
250.0000 mL | Freq: Once | INTRAVENOUS | Status: AC
Start: 2021-01-26 — End: 2021-01-26
  Administered 2021-01-26: 18:00:00 250 mL via INTRAVENOUS

## 2021-01-26 MED ORDER — INSULIN GLARGINE-YFGN 100 UNIT/ML ~~LOC~~ SOLN
23.0000 [IU] | Freq: Two times a day (BID) | SUBCUTANEOUS | Status: DC
Start: 2021-01-26 — End: 2021-01-31
  Administered 2021-01-26 – 2021-01-31 (×9): 23 [IU] via SUBCUTANEOUS
  Filled 2021-01-26 (×12): qty 0.23

## 2021-01-26 MED ORDER — BISACODYL 10 MG RE SUPP
10.0000 mg | Freq: Once | RECTAL | Status: AC
  Administered 2021-01-26: 17:00:00 10 mg via RECTAL
  Filled 2021-01-26: qty 1

## 2021-01-26 NOTE — Progress Notes (Addendum)
IP rehab admissions - I spoke with my rehab MD, Dr. Naaman Plummer.  He does not feel that the third floor apartment with so many steps is a reasonable discharge plan after a potential inpatient rehab stay.  At this point, I am going to call and discuss with patient's daughter.  Recommend pursuit of SNF placement unless daughter can come up with a more reasonable DC plan.  Call me for questions.  [phone]  I have received a denial from insurance carrier for acute inpatient rehab admission.  Recommend pursuit of SNF placement at this time.  Call for questions.  [phone]

## 2021-01-26 NOTE — Progress Notes (Signed)
Physical Therapy Treatment Patient Details Name: Lori [name] MRN: [id number] DOB: [birth year] Today's Date: 01/26/2021   History of Present Illness Pt is a 50 y/o F admitted on 01/19/21 after presenting with c/c of aphasia, N&V. MRI shows no acute intracranial pathology.Marland Kitchen PMH: HTN, HLD, DM, stroke with L sided weakness, thyroid disease, depression with anxiety, CAD s/p CABG, pheochromocytoma (s/p R adrenal gland removal)    PT Comments    Focused session on strengthening and sitting balance at edge of bed. Pt completed AAROM L LE prior to transferring supine to sit with ModA and HOB.  Sitting EOB x 25 minutes while completing reaching forward for objects while maintaining balance. Trunk side bending/down on elbows, forward/back movements for core strengthening. Pt with improved sitting tolerance and able to participate in more challenging activities. Continue PT per POC.      Recommendations for follow up therapy are one component of a multi-disciplinary discharge planning process, led by the attending physician.  Recommendations may be updated based on patient status, additional functional criteria and insurance authorization.  Follow Up Recommendations  Skilled nursing-short term rehab (<3 hours/day)     Assistance Recommended at Discharge Frequent or constant Supervision/Assistance  Equipment Recommendations  None recommended by PT    Recommendations for Other Services       Precautions / Restrictions Precautions Precautions: Fall Precaution Comments: L hemi (UE>LE), L field cut Restrictions Weight Bearing Restrictions: No     Mobility  Bed Mobility Overal bed mobility: Needs Assistance Bed Mobility: Supine to Sit Rolling: Mod assist   Supine to sit: HOB elevated;Mod assist Sit to supine: Max assist;HOB elevated   General bed mobility comments:  (Pt sat EOB x 25 minutes)    Transfers                        Ambulation/Gait                    Stairs             Wheelchair Mobility    Modified Rankin (Stroke Patients Only)       Balance Overall balance assessment: Needs assistance Sitting-balance support: Feet supported Sitting balance-Leahy Scale: Fair Sitting balance - Comments:  (CGA for sitting balance, occasional MinA to return to mid line due to fatigue/distraction) Postural control: Left lateral lean;Right lateral lean                                  Cognition Arousal/Alertness: Awake/alert Behavior During Therapy: WFL for tasks assessed/performed Overall Cognitive Status: Within Functional Limits for tasks assessed                                 General Comments:  (Pt attentive throughout session)        Exercises General Exercises - Lower Extremity Ankle Circles/Pumps: AROM;Both;15 reps Long Arc Quad: AAROM;Left;10 reps Heel Slides: AAROM;Left;10 reps Hip ABduction/ADduction: AAROM;Left;10 reps    General Comments        Pertinent Vitals/Pain Pain Assessment: No/denies pain    Home Living                          Prior Function            PT Goals (current goals can now be found in  the care plan section) Acute Rehab PT Goals Patient Stated Goal: go to rehab then home    Frequency    7X/week      PT Plan Current plan remains appropriate    Co-evaluation              AM-PAC PT "6 Clicks" Mobility   Outcome Measure  Help needed turning from your back to your side while in a flat bed without using bedrails?: A Lot Help needed moving from lying on your back to sitting on the side of a flat bed without using bedrails?: A Lot Help needed moving to and from a bed to a chair (including a wheelchair)?: A Lot Help needed standing up from a chair using your arms (e.g., wheelchair or bedside chair)?: A Lot Help needed to walk in hospital room?: Total Help needed climbing 3-5 steps with a railing? : Total 6 Click Score: 10     End of Session Equipment Utilized During Treatment: Gait belt Activity Tolerance: Patient tolerated treatment well Patient left: with call bell/phone within reach;in bed;with bed alarm set Nurse Communication: Mobility status PT Visit Diagnosis: Unsteadiness on feet (R26.81);Muscle weakness (generalized) (M62.81);Hemiplegia and hemiparesis;Other abnormalities of gait and mobility (R26.89) Hemiplegia - Right/Left: Left Hemiplegia - dominant/non-dominant: Non-dominant Hemiplegia - caused by: Cerebral infarction     Time: 1310-1355 PT Time Calculation (min) (ACUTE ONLY): 45 min  Charges:  $Therapeutic Exercise: 8-22 mins $Therapeutic Activity: 8-22 mins $Neuromuscular Re-education: 8-22 mins                    Mikel Cella, PTA   [name] 01/26/2021, 2:58 PM

## 2021-01-26 NOTE — TOC Progression Note (Signed)
Transition of Care [facility]) - Progression Note    Patient Details  Name: Lori [name] MRN: [id number] Date of Birth: [birth year]  [facility] CM/SW [place], RN Phone Number: 01/26/2021, 4:12 PM  Clinical Narrative:   daughter given [place] for SNF, states she will tour and look into facility this weekend.  Anticipated discharge monday    Expected Discharge Plan: [place] (vs SNF) Barriers to Discharge: Continued Medical Work up  Expected Discharge Plan and Services Expected Discharge Plan: [place] (vs SNF)     Post Acute Care Choice: IP Rehab Living arrangements for the past 2 months: Apartment                                       Social Determinants of Health (SDOH) Interventions    Readmission Risk Interventions No flowsheet data found.

## 2021-01-26 NOTE — Progress Notes (Signed)
Patient ID: [name], female   DOB: 1970-03-28, 50 y.o.   MRN: [id number] [facility] PROGRESS NOTE  Shawanda Sievert Becton [id number]:[id number] DOB: [birth year] DOA: 01/19/2021 PCP: Pcp, No  HPI/Subjective: Patient seen this morning she was eating breakfast.  Doing better with swelling.  No nausea or vomiting.  Still has not had a bowel movement in days.  Objective: Vitals:   01/26/21 0730 01/26/21 1246  BP: 118/87 106/74  Pulse: 91 86  Resp:  16  Temp: 98.5 F (36.9 C) 97.7 F (36.5 C)  SpO2:  95%    Intake/Output Summary (Last 24 hours) at 01/26/2021 1540 Last data filed at 01/26/2021 1300 Gross per 24 hour  Intake 120 ml  Output 450 ml  Net -330 ml   Filed Weights   01/19/21 0838 01/20/21 0049  Weight: 102.6 kg 97.7 kg    ROS: Review of Systems  Respiratory:  Negative for shortness of breath.   Cardiovascular:  Negative for chest pain.  Gastrointestinal:  Positive for constipation. Negative for abdominal pain, nausea and vomiting.  Exam: Physical Exam HENT:     Head: Normocephalic.     Mouth/Throat:     Pharynx: No oropharyngeal exudate.  Eyes:     General: Lids are normal.     Conjunctiva/sclera: Conjunctivae normal.  Cardiovascular:     Rate and Rhythm: Normal rate and regular rhythm.     Heart sounds: Normal heart sounds, S1 normal and S2 normal.  Pulmonary:     Breath sounds: No decreased breath sounds, wheezing, rhonchi or rales.  Abdominal:     Palpations: Abdomen is soft.     Tenderness: There is no abdominal tenderness.  Musculoskeletal:     Right lower leg: Swelling present.     Left lower leg: Swelling present.  Skin:    General: Skin is warm.     Findings: No rash.     Comments: Chapped lips  Neurological:     Mental Status: She is alert and oriented to person, place, and time.     Comments: Left arm weakness.  Difficulty with straight leg raise bilaterally.      Scheduled Meds:   stroke: mapping our early stages of recovery book    Does not apply Once   amLODipine  10 mg Oral Daily   aspirin  81 mg Oral Daily   atorvastatin  80 mg Oral Daily   chlorhexidine  15 mL Mouth Rinse BID   Chlorhexidine Gluconate Cloth  6 each Topical Daily   cyclobenzaprine  5 mg Oral TID   diphenhydrAMINE  25 mg Intravenous Q8H   docusate sodium  200 mg Oral QHS   escitalopram  20 mg Oral Daily   feeding supplement (KATE FARMS STANDARD 1.4)  325 mL Oral TID BM   gabapentin  200 mg Oral TID   heparin  5,000 Units Subcutaneous Q8H   hydrALAZINE  25 mg Oral Q8H   insulin aspart  0-5 Units Subcutaneous QHS   insulin aspart  0-9 Units Subcutaneous TID WC   insulin aspart  4 Units Subcutaneous TID WC   insulin glargine-yfgn  23 Units Subcutaneous BID   lactulose  20 g Oral Once   lactulose  30 g Oral BID   lisinopril  40 mg Oral Daily   loratadine  10 mg Oral Daily   LORazepam  0.5 mg Intravenous Once   LORazepam  0.25 mg Oral QHS   magic mouthwash w/lidocaine  15 mL Oral QID   mouth  rinse  15 mL Mouth Rinse q12n4p   melatonin  10 mg Oral QHS   metoCLOPramide (REGLAN) injection  10 mg Intravenous Q8H   metoprolol tartrate  100 mg Oral BID   multivitamin with minerals  1 tablet Oral Daily   pantoprazole (PROTONIX) IV  40 mg Intravenous Q12H   polyethylene glycol  17 g Oral Daily   sodium chloride flush  3 mL Intravenous Once   ticagrelor  90 mg Oral BID     Assessment/Plan:  Suspected acute stroke even though MRI of the brain was negative for acute infarct.  Patient has aphasia and weakness of bilateral legs and chronic weakness of left arm.  Unable to see to the left.  LDL 95.  Patient on aspirin, Brilinta and atorvastatin.  Neurology waiting for records from brain tumor history. Constipation.  Lactulose until bowel movement.  We will give a Dulcolax suppository. Nausea vomiting 2 days ago.  As needed Reglan.  Abdominal flat and upright negative. Lip swelling.  This has improved.  Lips are chapped and they we will give some  moisturizing lotion.  Completed Solu-Medrol. Sinus tachycardia on metoprolol Type 2 diabetes mellitus with hyperlipidemia on atorvastatin.  Decrease glargine insulin 23 units twice a day since sugars lower today. Essential hypertension.  Blood pressure little bit on the lower side today.  Discontinue hydralazine.  Give fluid bolus.  On lisinopril metoprolol and Norvasc. Prior history of pheochromocytoma status post removal right adrenal gland. Depression on Lexapro Weakness.  Physical therapy recommends rehab.  Patient lives with daughter up 3 flights of steps and no elevator.  Do have 1 bed offer today and hopefully will be able to go out to rehab on Monday.   Code Status:     Code Status Orders  (From admission, onward)           Start     Ordered   01/19/21 1218  Full code  Continuous        01/19/21 1218           Code Status History     This patient has a current code status but no historical code status.      Family Communication: Updated daughter on the phone Disposition Plan: Status is: Inpatient.  Not accepted to acute rehab.  Has 1 bed offer to subacute rehab.  Hopefully will go up to subacute rehab on Monday  [facility]  [facility]

## 2021-01-27 DIAGNOSIS — R531 Weakness: Secondary | ICD-10-CM | POA: Diagnosis not present

## 2021-01-27 DIAGNOSIS — K5900 Constipation, unspecified: Secondary | ICD-10-CM | POA: Diagnosis not present

## 2021-01-27 DIAGNOSIS — I639 Cerebral infarction, unspecified: Secondary | ICD-10-CM | POA: Diagnosis not present

## 2021-01-27 DIAGNOSIS — R112 Nausea with vomiting, unspecified: Secondary | ICD-10-CM | POA: Diagnosis not present

## 2021-01-27 LAB — GLUCOSE, CAPILLARY
Glucose-Capillary: 71 mg/dL (ref 70–99)
Glucose-Capillary: 179 mg/dL — ABNORMAL HIGH (ref 70–99)
Glucose-Capillary: 199 mg/dL — ABNORMAL HIGH (ref 70–99)
Glucose-Capillary: 122 mg/dL — ABNORMAL HIGH (ref 70–99)
Glucose-Capillary: 140 mg/dL — ABNORMAL HIGH (ref 70–99)

## 2021-01-27 MED ORDER — PANTOPRAZOLE SODIUM 40 MG PO TBEC
40.0000 mg | DELAYED_RELEASE_TABLET | Freq: Two times a day (BID) | ORAL | Status: DC
  Administered 2021-01-27 – 2021-02-02 (×12): 40 mg via ORAL
  Filled 2021-01-27 (×12): qty 1

## 2021-01-27 MED ORDER — LACTULOSE 10 GM/15ML PO SOLN
30.0000 g | Freq: Two times a day (BID) | ORAL | Status: DC | PRN
  Filled 2021-01-27: qty 60

## 2021-01-27 NOTE — Progress Notes (Signed)
MD confirmed discontinuation of neuro checks and NIH. Due to insulin being given late for lunch, hold per MD until bedtime. Orma Flaming, RN

## 2021-01-27 NOTE — Progress Notes (Signed)
Assumed care of patient 1500-1900. Orma Flaming, RN

## 2021-01-27 NOTE — Progress Notes (Signed)
Patient ID: Lori [name], female   DOB: 1970/12/16, 50 y.o.   MRN: [id number] [facility] PROGRESS NOTE  Katori Wirsing Becton [id number]:[id number] DOB: 1970/07/08 DOA: 01/19/2021 PCP: Pcp, No  HPI/Subjective: Patient feeling better today.  No further nausea.  Had bowel movement yesterday.  Admitted with weakness and suspected stroke.  Objective: Vitals:   01/27/21 0739 01/27/21 1210  BP: (!) 128/92 (!) 130/91  Pulse: 92 89  Resp: 16 16  Temp: 97.9 F (36.6 C) 98.1 F (36.7 C)  SpO2: 97% 98%    Intake/Output Summary (Last 24 hours) at 01/27/2021 1355 Last data filed at 01/27/2021 1043 Gross per 24 hour  Intake 120 ml  Output --  Net 120 ml   Filed Weights   01/19/21 0838 01/20/21 0049  Weight: 102.6 kg 97.7 kg    ROS: Review of Systems  Respiratory:  Negative for shortness of breath.   Cardiovascular:  Negative for chest pain.  Gastrointestinal:  Negative for abdominal pain, nausea and vomiting.  Exam: Physical Exam HENT:     Head: Normocephalic.     Mouth/Throat:     Pharynx: No oropharyngeal exudate.  Eyes:     General: Lids are normal.     Conjunctiva/sclera: Conjunctivae normal.  Cardiovascular:     Rate and Rhythm: Normal rate and regular rhythm.     Heart sounds: Normal heart sounds, S1 normal and S2 normal.  Pulmonary:     Breath sounds: No decreased breath sounds, wheezing, rhonchi or rales.  Abdominal:     Palpations: Abdomen is soft.     Tenderness: There is no abdominal tenderness.  Musculoskeletal:     Right lower leg: Swelling present.     Left lower leg: Swelling present.  Skin:    General: Skin is warm.     Findings: No rash.  Neurological:     Mental Status: She is alert and oriented to person, place, and time.      Scheduled Meds:   stroke: mapping our early stages of recovery book   Does not apply Once   amLODipine  10 mg Oral Daily   aspirin  81 mg Oral Daily   atorvastatin  80 mg Oral Daily   chlorhexidine  15 mL Mouth Rinse  BID   Chlorhexidine Gluconate Cloth  6 each Topical Daily   cyclobenzaprine  5 mg Oral TID   diphenhydrAMINE  25 mg Intravenous Q8H   docusate sodium  200 mg Oral QHS   escitalopram  20 mg Oral Daily   feeding supplement (KATE FARMS STANDARD 1.4)  325 mL Oral TID BM   gabapentin  200 mg Oral TID   heparin  5,000 Units Subcutaneous Q8H   insulin aspart  0-5 Units Subcutaneous QHS   insulin aspart  0-9 Units Subcutaneous TID WC   insulin aspart  4 Units Subcutaneous TID WC   insulin glargine-yfgn  23 Units Subcutaneous BID   lactulose  20 g Oral Once   lactulose  30 g Oral BID   lisinopril  40 mg Oral Daily   loratadine  10 mg Oral Daily   LORazepam  0.5 mg Intravenous Once   LORazepam  0.25 mg Oral QHS   magic mouthwash w/lidocaine  15 mL Oral QID   mouth rinse  15 mL Mouth Rinse q12n4p   melatonin  10 mg Oral QHS   metoCLOPramide (REGLAN) injection  10 mg Intravenous Q8H   metoprolol tartrate  100 mg Oral BID   multivitamin with minerals  1 tablet Oral Daily   pantoprazole (PROTONIX) IV  40 mg Intravenous Q12H   polyethylene glycol  17 g Oral Daily   sodium chloride flush  3 mL Intravenous Once   ticagrelor  90 mg Oral BID    Assessment/Plan:  Suspected acute stroke even though MRI of the brain was negative for acute infarct.  Patient has aphasia and weakness bilateral legs and chronic weakness of the left arm.  Patient also with poor vision to the left.  LDL 95.  Continue aspirin, Brilinta and atorvastatin.  Neurology still waiting for records for brain tumor history but cleared to go to rehab when bed available. Weakness.  Hopefully be able to go out to rehab on Monday. Nausea vomiting a few days ago this has improved.  Constipation is also improved. Sinus tachycardia metoprolol Essential hypertension.  Cut back on BP meds yesterday.  Continue lisinopril, metoprolol and Norvasc. Type 2 diabetes mellitus with hyperlipidemia.  On glargine insulin 23 units twice a day and short  acting insulin prior to meals. Prior history of pheochromocytoma status post removal right adrenal gland Depression on Lexapro GERD change IV Protonix over to oral.     Code Status:     Code Status Orders  (From admission, onward)           Start     Ordered   01/19/21 1218  Full code  Continuous        01/19/21 1218           Code Status History     This patient has a current code status but no historical code status.      Family Communication: Updated daughter yesterday Disposition Plan: Status is: Inpatient.  Hopefully out to rehab on Monday  Case discussed with neurology today   [place]

## 2021-01-27 NOTE — Progress Notes (Signed)
Physical Therapy Treatment Patient Details Name: Lori [name] MRN: [id number] DOB: 1970-08-04 Today's Date: 01/27/2021   History of Present Illness Pt is a 50 y/o F admitted on 01/19/21 after presenting with c/c of aphasia, N&V. MRI shows no acute intracranial pathology.Marland Kitchen PMH: HTN, HLD, DM, stroke with L sided weakness, thyroid disease, depression with anxiety, CAD s/p CABG, pheochromocytoma (s/p R adrenal gland removal)    PT Comments    Pt was in semi fowler's position in bed upon arrival of PT. Pt able to complete various activities in bed to strengthen her Les bilaterally. Pt still has significant weakness on her left side compared to her right side. Pt required modA to transition from supine to sit with HOB elevated as wall as UE assistance and assistance from PT for trunk support. Pt was able to tolerate sitting EOB for approximately 15 minutes this session but began to feel some s/s of nausea at this time and pt was assisted to semi-fowler's position at this time.  Pt and nursing staff positioned pt in bed with MaxA x 2. Pt left in bed will call bell, phone and nursing staff present. Recommend pt attend short term rehab following discharge for safety and to progressively improve her function .   Recommendations for follow up therapy are one component of a multi-disciplinary discharge planning process, led by the attending physician.  Recommendations may be updated based on patient status, additional functional criteria and insurance authorization.  Follow Up Recommendations  Skilled nursing-short term rehab (<3 hours/day)     Assistance Recommended at Discharge Frequent or constant Supervision/Assistance  Equipment Recommendations  None recommended by PT    Recommendations for Other Services       Precautions / Restrictions Precautions Precautions: Fall Precaution Comments: L hemi (UE>LE), L field cut Restrictions Weight Bearing Restrictions: No     Mobility  Bed  Mobility Overal bed mobility: Needs Assistance Bed Mobility: Supine to Sit Rolling: Mod assist   Supine to sit: HOB elevated;Mod assist Sit to supine: Max assist;HOB elevated   General bed mobility comments:  (Pt sat EOB x 25 minutes)    Transfers                        Ambulation/Gait               General Gait Details: unable/ unsafe at this time   Stairs             Wheelchair Mobility    Modified Rankin (Stroke Patients Only)       Balance                                            Cognition Arousal/Alertness: Awake/alert Behavior During Therapy: WFL for tasks assessed/performed Overall Cognitive Status: Within Functional Limits for tasks assessed                                 General Comments:  (Pt attentive throughout session)        Exercises General Exercises - Lower Extremity Ankle Circles/Pumps: AROM;Both;15 reps Quad Sets: Both;10 reps;Supine Long Arc Quad: AAROM;20 reps;Both (increased difficulty on the contralateral side) Heel Slides: AAROM;20 reps;Both Hip ABduction/ADduction: AAROM;Left;10 reps Hip Flexion/Marching: 20 reps;Seated;Both Heel Raises: 20 reps;Seated;Both    General Comments  Pertinent Vitals/Pain Pain Assessment: No/denies pain    Home Living Family/patient expects to be discharged to:: Private residence Living Arrangements: Children Available Help at Discharge: Family;Available 24 hours/day (dtr and SIL can assist) Type of Home: Apartment Home Access: Stairs to enter   Entrance Stairs-Number of Steps: Pt lives on 3rd floor apartment with multiple flights to access. Reports she had EMS assistance getting into this apartment as she recently moved.   Home Layout: One level Home Equipment: Rollator (4 wheels);Hand held shower head      Prior Function            PT Goals (current goals can now be found in the care plan section)      Frequency     7X/week      PT Plan      Co-evaluation   Reason for Co-Treatment: Complexity of the patient's impairments (multi-system involvement);For patient/therapist safety PT goals addressed during session: Mobility/safety with mobility        AM-PAC PT "6 Clicks" Mobility   Outcome Measure  Help needed turning from your back to your side while in a flat bed without using bedrails?: A Lot Help needed moving from lying on your back to sitting on the side of a flat bed without using bedrails?: A Lot Help needed moving to and from a bed to a chair (including a wheelchair)?: A Lot Help needed standing up from a chair using your arms (e.g., wheelchair or bedside chair)?: A Lot Help needed to walk in hospital room?: Total Help needed climbing 3-5 steps with a railing? : Total 6 Click Score: 10    End of Session Equipment Utilized During Treatment: Gait belt Activity Tolerance: Patient tolerated treatment well Patient left: with call bell/phone within reach;in bed;with bed alarm set;Other (comment) (nursing staff present) Nurse Communication: Mobility status PT Visit Diagnosis: Unsteadiness on feet (R26.81);Muscle weakness (generalized) (M62.81);Hemiplegia and hemiparesis;Other abnormalities of gait and mobility (R26.89) Hemiplegia - Right/Left: Left Hemiplegia - dominant/non-dominant: Non-dominant Hemiplegia - caused by: Cerebral infarction     Time: 9169-4503 PT Time Calculation (min) (ACUTE ONLY): 30 min  Charges:  $Therapeutic Exercise: 23-37 mins                     Rivka Barbara PT, DPT     Particia Lather 01/27/2021, 11:41 AM

## 2021-01-28 DIAGNOSIS — R2990 Unspecified symptoms and signs involving the nervous system: Secondary | ICD-10-CM | POA: Diagnosis not present

## 2021-01-28 DIAGNOSIS — K5900 Constipation, unspecified: Secondary | ICD-10-CM | POA: Diagnosis not present

## 2021-01-28 DIAGNOSIS — G450 Vertebro-basilar artery syndrome: Secondary | ICD-10-CM

## 2021-01-28 DIAGNOSIS — I639 Cerebral infarction, unspecified: Secondary | ICD-10-CM | POA: Diagnosis not present

## 2021-01-28 DIAGNOSIS — R531 Weakness: Secondary | ICD-10-CM | POA: Diagnosis not present

## 2021-01-28 DIAGNOSIS — R112 Nausea with vomiting, unspecified: Secondary | ICD-10-CM | POA: Diagnosis not present

## 2021-01-28 DIAGNOSIS — Z8673 Personal history of transient ischemic attack (TIA), and cerebral infarction without residual deficits: Secondary | ICD-10-CM | POA: Diagnosis not present

## 2021-01-28 DIAGNOSIS — R4701 Aphasia: Secondary | ICD-10-CM | POA: Diagnosis not present

## 2021-01-28 LAB — GLUCOSE, CAPILLARY
Glucose-Capillary: 234 mg/dL — ABNORMAL HIGH (ref 70–99)
Glucose-Capillary: 276 mg/dL — ABNORMAL HIGH (ref 70–99)
Glucose-Capillary: 192 mg/dL — ABNORMAL HIGH (ref 70–99)
Glucose-Capillary: 134 mg/dL — ABNORMAL HIGH (ref 70–99)

## 2021-01-28 MED ORDER — DIPHENHYDRAMINE HCL 25 MG PO CAPS: 25.0000 mg | ORAL_CAPSULE | Freq: Three times a day (TID) | ORAL | Status: DC | PRN

## 2021-01-28 MED ORDER — BIOTENE DRY MOUTH MT LIQD: 15.0000 mL | OROMUCOSAL | Status: DC | PRN

## 2021-01-28 MED ORDER — METOCLOPRAMIDE HCL 5 MG PO TABS
10.0000 mg | ORAL_TABLET | Freq: Three times a day (TID) | ORAL | Status: DC
  Administered 2021-01-28 – 2021-02-02 (×15): 10 mg via ORAL
  Filled 2021-01-28 (×15): qty 2

## 2021-01-28 NOTE — Progress Notes (Signed)
Patient ID: [name], female   DOB: 06/12/1970, 50 y.o.   MRN: [id number] [facility] PROGRESS NOTE  Esmay Amspacher Becton ESP:[id number] DOB: 16-Feb-1970 DOA: 01/19/2021 PCP: Pcp, No  HPI/Subjective: Patient feeling better today.  No nausea or vomiting.  Having small bowel movements.  Admitted with weakness and suspected stroke.  Patient states that is when she coughs having some pain in her left upper abdomen lower chest.  No pain if not coughing.  Objective: Vitals:   01/28/21 0801 01/28/21 1118  BP: 120/80 (!) 114/91  Pulse: 93 87  Resp: 16 17  Temp: 98.1 F (36.7 C) 98.6 F (37 C)  SpO2: 98% 100%    Intake/Output Summary (Last 24 hours) at 01/28/2021 1248 Last data filed at 01/28/2021 1048 Gross per 24 hour  Intake 120 ml  Output 900 ml  Net -780 ml   Filed Weights   01/19/21 0838 01/20/21 0049  Weight: 102.6 kg 97.7 kg    ROS: Review of Systems  Respiratory:  Negative for shortness of breath.   Cardiovascular:  Negative for chest pain.  Gastrointestinal:  Negative for abdominal pain, nausea and vomiting.  Exam: Physical Exam HENT:     Head: Normocephalic.     Mouth/Throat:     Pharynx: No oropharyngeal exudate.     Comments: Scabbing left lower lip  Eyes:     General: Lids are normal.     Conjunctiva/sclera: Conjunctivae normal.  Cardiovascular:     Rate and Rhythm: Normal rate and regular rhythm.     Heart sounds: Normal heart sounds, S1 normal and S2 normal.  Pulmonary:     Breath sounds: No decreased breath sounds, wheezing, rhonchi or rales.  Abdominal:     Palpations: Abdomen is soft.     Tenderness: There is abdominal tenderness in the left upper quadrant.  Musculoskeletal:     Right lower leg: Swelling present.     Left lower leg: Swelling present.  Skin:    General: Skin is warm.     Findings: No rash.  Neurological:     Mental Status: She is alert and oriented to person, place, and time.     Comments: Left arm weakness.       Scheduled Meds:   stroke: mapping our early stages of recovery book   Does not apply Once   amLODipine  10 mg Oral Daily   aspirin  81 mg Oral Daily   atorvastatin  80 mg Oral Daily   chlorhexidine  15 mL Mouth Rinse BID   Chlorhexidine Gluconate Cloth  6 each Topical Daily   cyclobenzaprine  5 mg Oral TID   docusate sodium  200 mg Oral QHS   escitalopram  20 mg Oral Daily   feeding supplement (KATE FARMS STANDARD 1.4)  325 mL Oral TID BM   gabapentin  200 mg Oral TID   heparin  5,000 Units Subcutaneous Q8H   insulin aspart  0-5 Units Subcutaneous QHS   insulin aspart  0-9 Units Subcutaneous TID WC   insulin aspart  4 Units Subcutaneous TID WC   insulin glargine-yfgn  23 Units Subcutaneous BID   lisinopril  40 mg Oral Daily   loratadine  10 mg Oral Daily   LORazepam  0.25 mg Oral QHS   magic mouthwash w/lidocaine  15 mL Oral QID   mouth rinse  15 mL Mouth Rinse q12n4p   melatonin  10 mg Oral QHS   metoCLOPramide  10 mg Oral TID AC  metoprolol tartrate  100 mg Oral BID   multivitamin with minerals  1 tablet Oral Daily   pantoprazole  40 mg Oral BID   polyethylene glycol  17 g Oral Daily   sodium chloride flush  3 mL Intravenous Once   ticagrelor  90 mg Oral BID     Assessment/Plan:  Suspected acute stroke even though MRI of the brain was negative for acute infarct.  Patient with aphasia, weakness bilateral legs, chronic weakness left arm, poor vision to the left.  Continue aspirin, Brilinta and atorvastatin.  LDL 95.  Rehab hopefully tomorrow.  History of brain tumor. Weakness.  Hopefully will be able to go out to rehab tomorrow. Nausea vomiting has resolved. Constipation.  Continue MiraLAX. Sinus tachycardia on metoprolol Essential hypertension.  Patient on lisinopril metoprolol Norvasc Type 2 diabetes mellitus on glargine insulin 23 units twice daily and short acting insulin prior to meals Depression on Lexapro GERD on oral Protonix History of pheochromocytoma  status post removal right adrenal gland Likely musculoskeletal pain when coughing.        Code Status:     Code Status Orders  (From admission, onward)           Start     Ordered   01/19/21 1218  Full code  Continuous        01/19/21 1218           Code Status History     This patient has a current code status but no historical code status.      Family Communication: Left message for daughter Disposition Plan: Status is: Inpatient.  Hopefully out to rehab tomorrow  [place]

## 2021-01-28 NOTE — Progress Notes (Signed)
Subjective: Lip swelling nearly resolved. She is asleep but arousable, fully oriented, and interactive. No change in weakness since yesterday. No new complaints today. Awaiting OSH records. Tentative plan to discharge to rehab tomorrow.  Objective: Current vital signs: BP 112/75 (BP Location: Left Arm)    Pulse 100    Temp 98.7 F (37.1 C) (Oral)    Resp 16    Ht 5\' 2"  (1.575 m)    Wt 97.7 kg    SpO2 98%    BMI 39.40 kg/m  Vital signs in last 24 hours: Temp:  [98.1 F (36.7 C)-98.7 F (37.1 C)] 98.7 F (37.1 C) (12/25 1610) Pulse Rate:  [87-100] 100 (12/25 1610) Resp:  [16-19] 16 (12/25 1610) BP: (102-120)/(61-91) 112/75 (12/25 1610) SpO2:  [95 %-100 %] 98 % (12/25 1610)  Intake/Output from previous day: 12/24 0701 - 12/25 0700 In: 0  Out: 400 [Urine:400] Intake/Output this shift: Total I/O In: 120 [P.O.:120] Out: 700 [Urine:700] Nutritional status:  Diet Order             DIET DYS 3 Room service appropriate? Yes with Assist; Fluid consistency: Nectar Thick  Diet effective now                  HEENT-  [place]/AT. Lip swelling is improved from yesterday  Lungs- Respirations unlabored but there is a faint, hushed stridorous quality to some inspirations Extremities- Warm and well perfused   Neurological Examination Mental Status: Asleep but arousable, oriented x4 Cranial Nerves: PERRL, VFF, EOMI, sensation intact, L UMN facial droop, hearing intact to voice LUE: Flexion contractures at elbow, wrist and digits. Arm adducted at shoulder. Increased tone. Some movement but none against gravity LLE: Increased tone. Falls to bed after passive elevation and release, with minimal effort against gravity. Spontaneous clonus to left foot intermittently. Sustained clonus with passive dorsiflexion by examiner.  RUE, RLE: at least 4-/5 throughout Cerebellar: mild ataxia on R FNF, UTA on L Gait: Unable to assess.   Lab Results: Results for orders placed or performed during the hospital  encounter of 01/19/21 (from the past 48 hour(s))  Glucose, capillary     Status: Abnormal   Collection Time: 01/26/21  8:30 PM  Result Value Ref Range   Glucose-Capillary 176 (H) 70 - 99 mg/dL    Comment: Glucose reference range applies only to samples taken after fasting for at least 8 hours.  Glucose, capillary     Status: None   Collection Time: 01/27/21  7:41 AM  Result Value Ref Range   Glucose-Capillary 71 70 - 99 mg/dL    Comment: Glucose reference range applies only to samples taken after fasting for at least 8 hours.  Glucose, capillary     Status: Abnormal   Collection Time: 01/27/21 12:04 PM  Result Value Ref Range   Glucose-Capillary 179 (H) 70 - 99 mg/dL    Comment: Glucose reference range applies only to samples taken after fasting for at least 8 hours.  Glucose, capillary     Status: Abnormal   Collection Time: 01/27/21  3:14 PM  Result Value Ref Range   Glucose-Capillary 199 (H) 70 - 99 mg/dL    Comment: Glucose reference range applies only to samples taken after fasting for at least 8 hours.  Glucose, capillary     Status: Abnormal   Collection Time: 01/27/21  5:29 PM  Result Value Ref Range   Glucose-Capillary 122 (H) 70 - 99 mg/dL    Comment: Glucose reference range applies only  to samples taken after fasting for at least 8 hours.  Glucose, capillary     Status: Abnormal   Collection Time: 01/27/21  9:05 PM  Result Value Ref Range   Glucose-Capillary 140 (H) 70 - 99 mg/dL    Comment: Glucose reference range applies only to samples taken after fasting for at least 8 hours.  Glucose, capillary     Status: Abnormal   Collection Time: 01/28/21  8:05 AM  Result Value Ref Range   Glucose-Capillary 234 (H) 70 - 99 mg/dL    Comment: Glucose reference range applies only to samples taken after fasting for at least 8 hours.  Glucose, capillary     Status: Abnormal   Collection Time: 01/28/21 11:19 AM  Result Value Ref Range   Glucose-Capillary 276 (H) 70 - 99 mg/dL     Comment: Glucose reference range applies only to samples taken after fasting for at least 8 hours.  Glucose, capillary     Status: Abnormal   Collection Time: 01/28/21  4:07 PM  Result Value Ref Range   Glucose-Capillary 192 (H) 70 - 99 mg/dL    Comment: Glucose reference range applies only to samples taken after fasting for at least 8 hours.    Recent Results (from the past 240 hour(s))  Blood Culture (routine x 2)     Status: None   Collection Time: 01/19/21  8:50 AM   Specimen: BLOOD  Result Value Ref Range Status   Specimen Description BLOOD BLOOD LEFT FOREARM  Final   Special Requests   Final    BOTTLES DRAWN AEROBIC AND ANAEROBIC Blood Culture results may not be optimal due to an inadequate volume of blood received in culture bottles   Culture   Final    NO GROWTH 5 DAYS Performed at [facility], [address] [place], [place] [address]    Report Status 01/24/2021 FINAL  Final  Resp Panel by RT-PCR (Flu A&B, Covid) Nasopharyngeal Swab     Status: None   Collection Time: 01/19/21  9:44 AM   Specimen: Nasopharyngeal Swab; Nasopharyngeal(NP) swabs in vial transport medium  Result Value Ref Range Status   SARS Coronavirus 2 by RT PCR NEGATIVE NEGATIVE Final    Comment: (NOTE) SARS-CoV-2 target nucleic acids are NOT DETECTED.  The SARS-CoV-2 RNA is generally detectable in upper respiratory specimens during the acute phase of infection. The lowest concentration of SARS-CoV-2 viral copies this assay can detect is 138 copies/mL. A negative result does not preclude SARS-Cov-2 infection and should not be used as the sole basis for treatment or other patient management decisions. A negative result may occur with  improper specimen collection/handling, submission of specimen other than nasopharyngeal swab, presence of viral mutation(s) within the areas targeted by this assay, and inadequate number of viral copies(<138 copies/mL). A negative result must be combined  with clinical observations, patient history, and epidemiological information. The expected result is Negative.  Fact Sheet for Patients:  EntrepreneurPulse.com.au  Fact Sheet for Healthcare Providers:  IncredibleEmployment.be  This test is no t yet approved or cleared by the [place] FDA and  has been authorized for detection and/or diagnosis of SARS-CoV-2 by FDA under an Emergency Use Authorization (EUA). This EUA will remain  in effect (meaning this test can be used) for the duration of the COVID-19 declaration under Section 564(b)(1) of the Act, 21 U.S.C.section 360bbb-3(b)(1), unless the authorization is terminated  or revoked sooner.       Influenza A by PCR NEGATIVE NEGATIVE  Final   Influenza B by PCR NEGATIVE NEGATIVE Final    Comment: (NOTE) The Xpert Xpress SARS-CoV-2/FLU/RSV plus assay is intended as an aid in the diagnosis of influenza from Nasopharyngeal swab specimens and should not be used as a sole basis for treatment. Nasal washings and aspirates are unacceptable for Xpert Xpress SARS-CoV-2/FLU/RSV testing.  Fact Sheet for Patients: EntrepreneurPulse.com.au  Fact Sheet for Healthcare Providers: IncredibleEmployment.be  This test is not yet approved or cleared by the [place] FDA and has been authorized for detection and/or diagnosis of SARS-CoV-2 by FDA under an Emergency Use Authorization (EUA). This EUA will remain in effect (meaning this test can be used) for the duration of the COVID-19 declaration under Section 564(b)(1) of the Act, 21 U.S.C. section 360bbb-3(b)(1), unless the authorization is terminated or revoked.  Performed at [facility], [address] [place], [place] [address]   Culture, blood (Routine X 2) w Reflex to ID Panel     Status: None   Collection Time: 01/19/21 10:02 AM   Specimen: BLOOD  Result Value Ref Range Status   Specimen  Description BLOOD RIGHT FOA  Final   Special Requests   Final    BOTTLES DRAWN AEROBIC AND ANAEROBIC Blood Culture adequate volume   Culture   Final    NO GROWTH 5 DAYS Performed at [facility], [address] [place], [place] [address]    Report Status 01/24/2021 FINAL  Final    Lipid Panel No results for input(s): CHOL, TRIG, HDL, CHOLHDL, VLDL, LDLCALC in the last 72 hours.   Studies/Results: No results found.  Medications: Scheduled:   stroke: mapping our early stages of recovery book   Does not apply Once   amLODipine  10 mg Oral Daily   aspirin  81 mg Oral Daily   atorvastatin  80 mg Oral Daily   chlorhexidine  15 mL Mouth Rinse BID   Chlorhexidine Gluconate Cloth  6 each Topical Daily   cyclobenzaprine  5 mg Oral TID   docusate sodium  200 mg Oral QHS   escitalopram  20 mg Oral Daily   feeding supplement (KATE FARMS STANDARD 1.4)  325 mL Oral TID BM   gabapentin  200 mg Oral TID   heparin  5,000 Units Subcutaneous Q8H   insulin aspart  0-5 Units Subcutaneous QHS   insulin aspart  0-9 Units Subcutaneous TID WC   insulin aspart  4 Units Subcutaneous TID WC   insulin glargine-yfgn  23 Units Subcutaneous BID   lisinopril  40 mg Oral Daily   loratadine  10 mg Oral Daily   LORazepam  0.25 mg Oral QHS   magic mouthwash w/lidocaine  15 mL Oral QID   mouth rinse  15 mL Mouth Rinse q12n4p   melatonin  10 mg Oral QHS   metoCLOPramide  10 mg Oral TID AC   metoprolol tartrate  100 mg Oral BID   multivitamin with minerals  1 tablet Oral Daily   pantoprazole  40 mg Oral BID   polyethylene glycol  17 g Oral Daily   sodium chloride flush  3 mL Intravenous Once   ticagrelor  90 mg Oral BID   Continuous:   Assessment: 50 year old female with hx multiple prior strokes presenting with acute onset of mutism and BLE weakness in the setting of vomiting and GI illness 1.. Exams and mental status were fluctuating wrt aphasia. EEG showed no epileptiform abnl. Mental status  and aphasia both improved on my  examination today. 2. MRI brain performed 01/20/21 shows no acute intracranial pathology. There is moderate parenchymal volume loss and chronic white matter microangiopathy, advanced for age. 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 Multiple small chronic microhemorrhages in a central distribution, likely hypertensive in etiology. 3. CTA of head and neck: Generalized and severe atherosclerosis, especially for age. Most severe abnormalities are in the posterior circulation where there are left vertebral and basilar occlusions which are age indeterminate, as well as severe right V4 segment stenosis. No flow limiting stenosis in the cervical carotid circulation. Probable flow limiting stenosis at the carotid siphons, especially on the right. 4. Lip swelling likely 2/2 allergic rxn to iodinated contrast or possibly ticlodipine. Pt is on benadryl and solumedrol. 5. Patient was previously a clinic patient of mine 2 years ago in [place], [place]. At that time she told me that she had a hx primary brain malignancy resected yrs ago at [facility]. Records from [facility] in [place] have been requested but not yet received. 6. Uncontrolled DM2 w/ A1c 10.2 7. TTE no intracardiac clot  Given her brain MRI negative for acute process suspect her presentation may have been 2/2 recrudescence of prior stroke sx in the setting of GI illness   Recommendations: - Goal SBP of 130-150, avoid hypotension 2/2 severe vertebrobasilar stenosis - ASA 81 mg po qd. Has an allergy to clopidogrel. 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  Continue DAPT until outpatient f/u with neurology given severe vertebrobasilar disease - Atorvastatin 80mg  daily - PT/OT/SLP - Hopefully pt to be discharged to rehab tomorrow - Records release faxed to [facility], not yet received - I will arrange outpatient neuro  f/u   No further neurologic workup indicated as inpatient. Neurology to sign off, but please re-engage if new neurologic concerns arise.  Su Monks, MD [facility] [phone]  If 7pm- 7am, please page neurology on call as listed in [place].

## 2021-01-29 DIAGNOSIS — R531 Weakness: Secondary | ICD-10-CM | POA: Diagnosis not present

## 2021-01-29 DIAGNOSIS — K5900 Constipation, unspecified: Secondary | ICD-10-CM | POA: Diagnosis not present

## 2021-01-29 DIAGNOSIS — R112 Nausea with vomiting, unspecified: Secondary | ICD-10-CM | POA: Diagnosis not present

## 2021-01-29 DIAGNOSIS — I639 Cerebral infarction, unspecified: Secondary | ICD-10-CM | POA: Diagnosis not present

## 2021-01-29 LAB — CBC
HCT: 35.4 % — ABNORMAL LOW (ref 36.0–46.0)
Hemoglobin: 11.8 g/dL — ABNORMAL LOW (ref 12.0–15.0)
MCH: 26.5 pg (ref 26.0–34.0)
MCHC: 33.3 g/dL (ref 30.0–36.0)
MCV: 79.4 fL — ABNORMAL LOW (ref 80.0–100.0)
Platelets: 339 10*3/uL (ref 150–400)
RBC: 4.46 MIL/uL (ref 3.87–5.11)
RDW: 15.1 % (ref 11.5–15.5)
WBC: 13.5 10*3/uL — ABNORMAL HIGH (ref 4.0–10.5)
nRBC: 0 % (ref 0.0–0.2)

## 2021-01-29 LAB — GLUCOSE, CAPILLARY
Glucose-Capillary: 146 mg/dL — ABNORMAL HIGH (ref 70–99)
Glucose-Capillary: 181 mg/dL — ABNORMAL HIGH (ref 70–99)
Glucose-Capillary: 187 mg/dL — ABNORMAL HIGH (ref 70–99)
Glucose-Capillary: 168 mg/dL — ABNORMAL HIGH (ref 70–99)

## 2021-01-29 LAB — BASIC METABOLIC PANEL
Anion gap: 5 (ref 5–15)
BUN: 9 mg/dL (ref 6–20)
CO2: 27 mmol/L (ref 22–32)
Calcium: 8.2 mg/dL — ABNORMAL LOW (ref 8.9–10.3)
Chloride: 105 mmol/L (ref 98–111)
Creatinine, Ser: 0.8 mg/dL (ref 0.44–1.00)
GFR, Estimated: >60 mL/min (ref >60)
Glucose, Bld: 119 mg/dL — ABNORMAL HIGH (ref 70–99)
Potassium: 4.1 mmol/L (ref 3.5–5.1)
Sodium: 137 mmol/L (ref 135–145)

## 2021-01-29 MED ORDER — NEPRO/CARBSTEADY PO LIQD
237.0000 mL | Freq: Three times a day (TID) | ORAL | Status: DC
  Administered 2021-01-30 – 2021-02-02 (×2): 237 mL via ORAL

## 2021-01-29 NOTE — Progress Notes (Signed)
Despite being aware and re-educated on speech therapy recommendations for medication administration via crushed in puree, patient refused. Patient stated, "Do NOT crush my pills. I have always taken them whole in applesauce." Patient again reminded of ST recommendations. Patient again refused. Medications given whole in puree per request. No overt signs of aspiration noted.  Patient also noted to be eating italian ice via spoon. Patient states she "does NOT need thickened liquids." Patient aware of risks for aspiration.

## 2021-01-29 NOTE — Progress Notes (Signed)
Physical Therapy Treatment Patient Details Name: Lori [name] MRN: [id number] DOB: January 15, 1971 Today's Date: 01/29/2021   History of Present Illness Pt is a 50 y/o F admitted on 01/19/21 after presenting with c/c of aphasia, N&V. MRI shows no acute intracranial pathology.Marland Kitchen PMH: HTN, HLD, DM, stroke with L sided weakness, thyroid disease, depression with anxiety, CAD s/p CABG, pheochromocytoma (s/p R adrenal gland removal)    PT Comments    Pt was pleasant and motivated to participate during the session and put forth good effort throughout. Pt was able to perform multiple sit to/from stand transfers with fair control and stability. Pt was unable to advance either LE during amb attempts but was able to perform a lateral scoot transfer without physical assist. Pt making progress towards goals but remains a high fall risk and will benefit from PT services in a SNF setting upon discharge to safely address deficits listed in patient problem list for decreased caregiver assistance and eventual return to PLOF.     Recommendations for follow up therapy are one component of a multi-disciplinary discharge planning process, led by the attending physician.  Recommendations may be updated based on patient status, additional functional criteria and insurance authorization.  Follow Up Recommendations  Skilled nursing-short term rehab (<3 hours/day)     Assistance Recommended at Discharge Frequent or constant Supervision/Assistance  Equipment Recommendations  None recommended by PT    Recommendations for Other Services       Precautions / Restrictions Precautions Precautions: Fall Precaution Comments: L hemi (UE>LE), L field cut Restrictions Weight Bearing Restrictions: No     Mobility  Bed Mobility Overal bed mobility: Needs Assistance Bed Mobility: Supine to Sit     Supine to sit: Mod assist     General bed mobility comments: Mod A for BLE and trunk control    Transfers Overall  transfer level: Needs assistance Equipment used: 2 person hand held assist Transfers: Sit to/from Stand Sit to Stand: Min assist;+2 safety/equipment          Lateral/Scoot Transfers: Min guard General transfer comment: Pt able to perform multiple sit to/from stands with min A and laterally scoot from bed to chair with CGA and cues for sequencing    Ambulation/Gait               General Gait Details: unable/ unsafe at this time   Stairs             Wheelchair Mobility    Modified Rankin (Stroke Patients Only)       Balance Overall balance assessment: Needs assistance Sitting-balance support: Feet supported Sitting balance-Leahy Scale: Fair     Standing balance support: Bilateral upper extremity supported Standing balance-Leahy Scale: Fair                              Cognition Arousal/Alertness: Awake/alert Behavior During Therapy: WFL for tasks assessed/performed Overall Cognitive Status: Within Functional Limits for tasks assessed                                          Exercises Total Joint Exercises Ankle Circles/Pumps: AROM;Strengthening;Both;10 reps Quad Sets: Strengthening;Both;10 reps Long Arc Quad: AROM;Strengthening;Both;10 reps Other Exercises Other Exercises: Static standing at EOB 3 x 30 sec for strengthening and improved activity tolerance    General Comments  Pertinent Vitals/Pain Pain Assessment: No/denies pain    Home Living                          Prior Function            PT Goals (current goals can now be found in the care plan section) Progress towards PT goals: Progressing toward goals    Frequency    7X/week      PT Plan Current plan remains appropriate    Co-evaluation              AM-PAC PT "6 Clicks" Mobility   Outcome Measure  Help needed turning from your back to your side while in a flat bed without using bedrails?: A Lot Help needed moving  from lying on your back to sitting on the side of a flat bed without using bedrails?: A Lot Help needed moving to and from a bed to a chair (including a wheelchair)?: A Lot Help needed standing up from a chair using your arms (e.g., wheelchair or bedside chair)?: A Lot Help needed to walk in hospital room?: Total Help needed climbing 3-5 steps with a railing? : Total 6 Click Score: 10    End of Session Equipment Utilized During Treatment: Gait belt Activity Tolerance: Patient tolerated treatment well Patient left: in chair;with call bell/phone within reach Nurse Communication: Mobility status;Other (comment) (No chair alarm in pt's chair) PT Visit Diagnosis: Unsteadiness on feet (R26.81);Muscle weakness (generalized) (M62.81);Hemiplegia and hemiparesis;Other abnormalities of gait and mobility (R26.89) Hemiplegia - Right/Left: Left Hemiplegia - dominant/non-dominant: Non-dominant Hemiplegia - caused by: Cerebral infarction     Time: 1610-9604 PT Time Calculation (min) (ACUTE ONLY): 24 min  Charges:  $Therapeutic Exercise: 8-22 mins $Therapeutic Activity: 8-22 mins                     D. Scott Josanna Hefel PT, DPT 01/29/21, 11:37 AM

## 2021-01-29 NOTE — Progress Notes (Signed)
Patient ID: Lori [name], female   DOB: May 25, 1970, 50 y.o.   MRN: [id number] [facility] PROGRESS NOTE  Lori [name] CLE:[id number] DOB: [birth year] DOA: 01/19/2021 PCP: Pcp, No  HPI/Subjective: Patient feeling okay and offers no complaints.  Awaiting to go out to rehab.  Initially admitted to because of stroke symptoms.  Objective: Vitals:   01/29/21 0900 01/29/21 1107  BP: (!) 143/97 111/85  Pulse: (!) 104 100  Resp:  16  Temp: 98.6 F (37 C) 99.3 F (37.4 C)  SpO2:  98%     Filed Weights   01/19/21 0838 01/20/21 0049  Weight: 102.6 kg 97.7 kg    ROS: Review of Systems  Respiratory:  Negative for shortness of breath.   Cardiovascular:  Negative for chest pain.  Gastrointestinal:  Negative for abdominal pain, nausea and vomiting.  Exam: Physical Exam HENT:     Head: Normocephalic.     Mouth/Throat:     Pharynx: No oropharyngeal exudate.  Eyes:     General: Lids are normal.     Conjunctiva/sclera: Conjunctivae normal.  Cardiovascular:     Rate and Rhythm: Normal rate and regular rhythm.     Heart sounds: Normal heart sounds, S1 normal and S2 normal.  Pulmonary:     Breath sounds: No decreased breath sounds, wheezing, rhonchi or rales.  Abdominal:     Palpations: Abdomen is soft.     Tenderness: There is no abdominal tenderness.  Musculoskeletal:     Right lower leg: Swelling present.     Left lower leg: Swelling present.  Skin:    General: Skin is warm.     Findings: No rash.  Neurological:     Mental Status: She is alert and oriented to person, place, and time.     Comments: Left arm weakness.  Patient was able to pivot and get into the chair today.      Scheduled Meds:   stroke: mapping our early stages of recovery book   Does not apply Once   amLODipine  10 mg Oral Daily   aspirin  81 mg Oral Daily   atorvastatin  80 mg Oral Daily   chlorhexidine  15 mL Mouth Rinse BID   Chlorhexidine Gluconate Cloth  6 each Topical Daily    cyclobenzaprine  5 mg Oral TID   docusate sodium  200 mg Oral QHS   escitalopram  20 mg Oral Daily   feeding supplement (NEPRO CARB STEADY)  237 mL Oral TID BM   gabapentin  200 mg Oral TID   heparin  5,000 Units Subcutaneous Q8H   insulin aspart  0-5 Units Subcutaneous QHS   insulin aspart  0-9 Units Subcutaneous TID WC   insulin aspart  4 Units Subcutaneous TID WC   insulin glargine-yfgn  23 Units Subcutaneous BID   lisinopril  40 mg Oral Daily   loratadine  10 mg Oral Daily   LORazepam  0.25 mg Oral QHS   magic mouthwash w/lidocaine  15 mL Oral QID   mouth rinse  15 mL Mouth Rinse q12n4p   melatonin  10 mg Oral QHS   metoCLOPramide  10 mg Oral TID AC   metoprolol tartrate  100 mg Oral BID   multivitamin with minerals  1 tablet Oral Daily   pantoprazole  40 mg Oral BID   polyethylene glycol  17 g Oral Daily   sodium chloride flush  3 mL Intravenous Once   ticagrelor  90 mg Oral BID  Assessment/Plan:  Suspected acute stroke even though MRI did not show any acute stroke.  MRI did show multiple chronic microhemorrhages.  Patient does have aphasia bilateral lower extremity weakness chronic left arm weakness.  As per neurology continue aspirin Brilinta and atorvastatin.  LDL 95.  Patient has history of brain tumor. Weakness.  Facility working on Ship broker to go out to rehab Nausea vomiting has resolved.  Change Reglan to p.o. Constipation continue MiraLAX Sinus tachycardia and hypertension on metoprolol and lisinopril Norvasc Type 2 diabetes mellitus on glargine insulin 23 units twice daily and short acting insulin prior to meals Depression on Lexapro GERD.  Continue Protonix History of pheochromocytoma status post removal right adrenal gland.        Code Status:     Code Status Orders  (From admission, onward)           Start     Ordered   01/19/21 1218  Full code  Continuous        01/19/21 1218           Code Status History     This  patient has a current code status but no historical code status.      Family Communication: Left message for daughter. Disposition Plan: Status is: Inpatient.  Patient gave me permission to start insurance authorization for rehab.  Facility working on Ship broker.  [place]  [facility] [employer]

## 2021-01-29 NOTE — TOC Progression Note (Addendum)
Transition of Care [facility]) - Progression Note    Patient Details  Name: Lori [name] MRN: [id number] Date of Birth: 1970-09-04  Transition of [facility]) CM/SW [place], LCSW Phone Number: 01/29/2021, 9:07 AM  Clinical Narrative:  Left voicemail for daughter to see if she had toured [facility] yet.   11:36 am: Patient has accepted bed offer from [place]. Admissions coordinator will start insurance authorization. She said she's pretty sure patient will not have to turn over her check with the Managed Medicaid plan. Patient is aware. Told her we would let her know if that changed prior to discharge.  Expected Discharge Plan: [place] (vs SNF) Barriers to Discharge: Continued Medical Work up  Expected Discharge Plan and Services Expected Discharge Plan: [place] (vs SNF)     Post Acute Care Choice: IP Rehab Living arrangements for the past 2 months: Apartment                                       Social Determinants of Health (SDOH) Interventions    Readmission Risk Interventions No flowsheet data found.

## 2021-01-29 NOTE — Progress Notes (Signed)
Nutrition Follow-up  DOCUMENTATION CODES:   Obesity unspecified  INTERVENTION:   -Magic cup TID with meals, each supplement provides 290 kcal and 9 grams of protein  -Vital Cuisine Shake TID, each supplement provides 520 kcals and 22 grams protein -MVI with minerals daily -Nepro Shake po TID, each supplement provides 425 kcal and 19 grams protein   NUTRITION DIAGNOSIS:   Inadequate oral intake related to nausea, vomiting as evidenced by per patient/family report.  Ongoing  GOAL:   Patient will meet greater than or equal to 90% of their needs  Progressing   MONITOR:   Diet advancement, PO intake, Supplement acceptance, Labs, Weight trends, I & O's  REASON FOR ASSESSMENT:   Diagnosis    ASSESSMENT:   50 yo female with a PMH of HTN, HLD, T2DM, stroke with left-sided weakness, thyroid disease (patient does not know which type of thyroid disease), depression with anxiety, CAD, s/p of CABG, pheochromocytoma (s/p of right adrenal gland removal per patient), who presents with aphasia, nausea, and vomiting. Admitted with stroke.  12/22- s/p BSE- advanced to dysphagia 3 diet with nectar thick liquids  Reviewed I/O's: -780 ml x 24 hours and -4.1 L since admission  UOP: 900 ml x 24 hours   Pt with poor oral intake. Noted meal completion 0-25%. Pt on [employer] currently, but is not of nectar consistency. Will change to Nepro. Pt diet is very limited due to food preferences.   Pt awaiting insurance authorization for SNF placement.   Medications reviewed and include colace and miralax.   Labs reviewed: CBGS: 134-276 (inpatient orders for glycemic control are 0-5 units insulin aspart daily at bedtime, 0-9 units insulin aspart TID with meals, 4 units insulin aspart TID with meals, and 23 units insulin glargine-yfgn BID).    Diet Order:   Diet Order             DIET DYS 3 Room service appropriate? Yes with Assist; Fluid consistency: Nectar Thick  Diet effective now                    EDUCATION NEEDS:   Education needs have been addressed  Skin:  Skin Assessment: Reviewed RN Assessment  Last BM:  01/27/21  Height:   Ht Readings from Last 1 Encounters:  01/20/21 5\' 2"  (1.575 m)    Weight:   Wt Readings from Last 1 Encounters:  01/20/21 97.7 kg   BMI:  Body mass index is 39.4 kg/m.  Estimated Nutritional Needs:   Kcal:  2100-2300  Protein:  115-130 grams  Fluid:  >2.1 L    Loistine Chance, RD, LDN, [place] Registered Dietitian II Certified Diabetes Care and Education Specialist Please refer to [facility] for RD and/or RD on-call/weekend/after hours pager

## 2021-01-30 DIAGNOSIS — R000 Tachycardia, unspecified: Secondary | ICD-10-CM | POA: Diagnosis not present

## 2021-01-30 DIAGNOSIS — I639 Cerebral infarction, unspecified: Secondary | ICD-10-CM | POA: Diagnosis not present

## 2021-01-30 DIAGNOSIS — K5900 Constipation, unspecified: Secondary | ICD-10-CM | POA: Diagnosis not present

## 2021-01-30 DIAGNOSIS — R531 Weakness: Secondary | ICD-10-CM | POA: Diagnosis not present

## 2021-01-30 LAB — GLUCOSE, CAPILLARY
Glucose-Capillary: 174 mg/dL — ABNORMAL HIGH (ref 70–99)
Glucose-Capillary: 197 mg/dL — ABNORMAL HIGH (ref 70–99)
Glucose-Capillary: 119 mg/dL — ABNORMAL HIGH (ref 70–99)
Glucose-Capillary: 216 mg/dL — ABNORMAL HIGH (ref 70–99)

## 2021-01-30 MED ORDER — LISINOPRIL 20 MG PO TABS
20.0000 mg | ORAL_TABLET | Freq: Every day | ORAL | Status: DC
  Administered 2021-01-31 – 2021-02-02 (×3): 20 mg via ORAL
  Filled 2021-01-30 (×3): qty 1

## 2021-01-30 MED ORDER — AMLODIPINE BESYLATE 5 MG PO TABS
5.0000 mg | ORAL_TABLET | Freq: Every day | ORAL | Status: DC
  Administered 2021-01-31 – 2021-02-02 (×3): 5 mg via ORAL
  Filled 2021-01-30 (×3): qty 1

## 2021-01-30 NOTE — Progress Notes (Signed)
Occupational Therapy Treatment Patient Details Name: Lori [name] MRN: [id number] DOB: [birth year] Today's Date: 01/30/2021   History of present illness Pt is a 50 y/o F admitted on 01/19/21 after presenting with c/c of aphasia, N&V. MRI shows no acute intracranial pathology.Marland Kitchen PMH: HTN, HLD, DM, stroke with L sided weakness, thyroid disease, depression with anxiety, CAD s/p CABG, pheochromocytoma (s/p R adrenal gland removal)   OT comments  Pt seen for OT treatment on this date. Upon arrival to room, pt awake and seated upright in bed. Pt reporting no pain and agreeable to OT tx. Pt currently requires MOD A for bed mobility, MOD A for stand pivot transfer to recliner, and MIN A for seated grooming tasks d/t L-sided weakness, decreased balance, and decreased activity tolerance. While seated in recliner, pt engaged in seated AAROM exercises for LUE to improve functional strength of LUE in preparation for functional activities. Pt encouraged to perform x3/day, with pt verbalizing understanding. Pt is making good progress toward goals and continues to benefit from skilled OT services to maximize return to PLOF and minimize risk of future falls, injury, caregiver burden, and readmission. Will continue to follow POC. Discharge recommendation remains appropriate.     Recommendations for follow up therapy are one component of a multi-disciplinary discharge planning process, led by the attending physician.  Recommendations may be updated based on patient status, additional functional criteria and insurance authorization.    Follow Up Recommendations  Skilled nursing-short term rehab (<3 hours/day)    Assistance Recommended at Discharge Frequent or constant Supervision/Assistance  Equipment Recommendations  Other (comment) (defer to next venue of care)       Precautions / Restrictions Precautions Precautions: Fall Precaution Comments: L hemi (UE>LE), L field cut Restrictions Weight Bearing  Restrictions: No       Mobility Bed Mobility Overal bed mobility: Needs Assistance Bed Mobility: Supine to Sit     Supine to sit: Mod assist     General bed mobility comments: MOD A for LLE and trunk control    Transfers Overall transfer level: Needs assistance Equipment used: 1 person hand held assist Transfers: Sit to/from Stand;Bed to chair/wheelchair/BSC Sit to Stand: Mod assist Stand pivot transfers: Mod assist               Balance Overall balance assessment: Needs assistance Sitting-balance support: No upper extremity supported;Feet supported Sitting balance-Leahy Scale: Fair     Standing balance support: Single extremity supported;During functional activity Standing balance-Leahy Scale: Poor Standing balance comment: Requires MOD A to maintain static standing balance                           ADL either performed or assessed with clinical judgement   ADL Overall ADL's : Needs assistance/impaired     Grooming: Oral care;Wash/dry face;Minimal assistance Grooming Details (indicate cue type and reason): MIN A to engage LUE in grooming tasks (e.g., to place toothpaste containter in LUE)                                      Cognition Arousal/Alertness: Awake/alert Behavior During Therapy: WFL for tasks assessed/performed Overall Cognitive Status: Within Functional Limits for tasks assessed  Exercises General Exercises - Upper Extremity Elbow Extension: AAROM;Self ROM;Left;Seated Wrist Flexion: AAROM;Self ROM;Left;10 reps;Seated Digit Composite Flexion: AAROM;Self ROM;10 reps;Seated           Pertinent Vitals/ Pain       Pain Assessment: No/denies pain         Frequency  Min 3X/week        Progress Toward Goals  OT Goals(current goals can now be found in the care plan section)  Progress towards OT goals: Progressing toward goals  Acute Rehab OT  Goals Patient Stated Goal: to get stronger OT Goal Formulation: With patient Time For Goal Achievement: 02/03/21 Potential to Achieve Goals: Good  Plan Discharge plan remains appropriate;Frequency remains appropriate       AM-PAC OT "6 Clicks" Daily Activity     Outcome Measure   Help from another person eating meals?: A Little Help from another person taking care of personal grooming?: A Little Help from another person toileting, which includes using toliet, bedpan, or urinal?: A Lot Help from another person bathing (including washing, rinsing, drying)?: A Lot Help from another person to put on and taking off regular upper body clothing?: A Lot Help from another person to put on and taking off regular lower body clothing?: Total 6 Click Score: 13    End of Session    OT Visit Diagnosis: Unsteadiness on feet (R26.81);Muscle weakness (generalized) (M62.81);Other symptoms and signs involving the nervous system (R29.898);Hemiplegia and hemiparesis Hemiplegia - Right/Left: Left Hemiplegia - dominant/non-dominant: Non-Dominant Hemiplegia - caused by: Cerebral infarction   Activity Tolerance Patient tolerated treatment well   Patient Left in chair;with call bell/phone within reach;with chair alarm set   Nurse Communication Mobility status        Time: 5638-7564 OT Time Calculation (min): 35 min  Charges: OT General Charges $OT Visit: 1 Visit OT Treatments $Self Care/Home Management : 8-22 mins $Therapeutic Activity: 8-22 mins  Fredirick Maudlin, OTR/L [place]

## 2021-01-30 NOTE — Progress Notes (Signed)
Patient ID: Lori [name], female   DOB: 1970-08-24, 50 y.o.   MRN: [id number] [facility] PROGRESS NOTE  Lori [name] [name] DOB: [birth year] DOA: 01/19/2021 PCP: Lori [name]: Patient feeling better.  She occasionally has some cough where she feels some pain in her lower left side.  If she does not cough she does not have any pain.  Doing better with regards to her communication and she is trying to get stronger.  Admitted with weakness and found to have numerous microhemorrhages on MRI of the brain.  Objective: Vitals:   01/30/21 1138 01/30/21 1616  BP: 105/78 105/82  Pulse: 95 95  Resp: 19 17  Temp: 98.3 F (36.8 C) (!) 97.5 F (36.4 C)  SpO2: 99% 99%    Intake/Output Summary (Last 24 hours) at 01/30/2021 1809 Last data filed at 01/30/2021 1700 Gross per 24 hour  Intake 600 ml  Output 1250 ml  Net -650 ml   Filed Weights   01/19/21 0838 01/20/21 0049  Weight: 102.6 kg 97.7 kg    ROS: Review of Systems  Respiratory:  Positive for cough. Negative for shortness of breath.   Cardiovascular:  Negative for chest pain.  Gastrointestinal:  Negative for abdominal pain, nausea and vomiting.  Exam: Physical Exam HENT:     Head: Normocephalic.     Mouth/Throat:     Pharynx: No oropharyngeal exudate.  Eyes:     General: Lids are normal.     Conjunctiva/sclera: Conjunctivae normal.  Cardiovascular:     Rate and Rhythm: Normal rate and regular rhythm.     Heart sounds: Normal heart sounds, S1 normal and S2 normal.  Pulmonary:     Breath sounds: No decreased breath sounds, wheezing, rhonchi or rales.  Abdominal:     Palpations: Abdomen is soft.     Tenderness: There is no abdominal tenderness.  Musculoskeletal:     Right lower leg: Swelling present.     Left lower leg: Swelling present.  Neurological:     Mental Status: She is alert.     Comments: Left arm weakness.  Bilateral leg weakness.  Unable to see to the left.      Scheduled  Meds:   stroke: mapping our early stages of recovery book   Does not apply Once   [START ON 01/31/2021] amLODipine  5 mg Oral Daily   aspirin  81 mg Oral Daily   atorvastatin  80 mg Oral Daily   chlorhexidine  15 mL Mouth Rinse BID   Chlorhexidine Gluconate Cloth  6 each Topical Daily   cyclobenzaprine  5 mg Oral TID   docusate sodium  200 mg Oral QHS   escitalopram  20 mg Oral Daily   feeding supplement (NEPRO CARB STEADY)  237 mL Oral TID BM   gabapentin  200 mg Oral TID   heparin  5,000 Units Subcutaneous Q8H   insulin aspart  0-5 Units Subcutaneous QHS   insulin aspart  0-9 Units Subcutaneous TID WC   insulin aspart  4 Units Subcutaneous TID WC   insulin glargine-yfgn  23 Units Subcutaneous BID   lisinopril  40 mg Oral Daily   loratadine  10 mg Oral Daily   LORazepam  0.25 mg Oral QHS   magic mouthwash w/lidocaine  15 mL Oral QID   mouth rinse  15 mL Mouth Rinse q12n4p   melatonin  10 mg Oral QHS   metoCLOPramide  10 mg Oral TID AC   metoprolol tartrate  100  mg Oral BID   multivitamin with minerals  1 tablet Oral Daily   pantoprazole  40 mg Oral BID   polyethylene glycol  17 g Oral Daily   sodium chloride flush  3 mL Intravenous Once   ticagrelor  90 mg Oral BID    Brief history.  50 year old female presenting with weakness.  She has a past medical history of CAD, type 2 diabetes mellitus, hypertension, depression, pheochromocytoma, numerous strokes in the past and brain tumor history.  Patient was admitted 11 days ago with aphasia nausea vomiting.  MRI of the brain did not show any acute stroke but did show numerous chronic microhemorrhages.  Patient seen by neurology.  During the hospital course also was on IV Solu-Medrol secondary to lip swelling.  Patient is doing better and not having any nausea or vomiting.  Tolerating dysphagia diet. Assessment/Plan:  Suspected acute stroke even though MRI of the brain did not show any acute stroke.  The MRI of the brain did show  multiple chronic microhemorrhages.  Patient does have aphasia and bilateral lower extremity weakness and chronic left arm weakness.  Neurology cleared to go out to rehab once insurance authorization occurs.  Continue aspirin, Brilinta and atorvastatin.  LDL 95.  Patient does have a history of brain tumor and Dr. Quinn [name] will follow up results of records coming over from other facility and set up with neurology as outpatient.  Speech therapy recommending dysphagia 3 diet with nectar thick liquids upon disposition. Weakness.  Awaiting insurance authorization to go out to rehab Nausea vomiting has resolved.  Reglan changed to oral Constipation.  Continue MiraLAX Sinus tachycardia and hypertension on metoprolol high-dose.  With blood pressure being on the lower side will cut back on lisinopril and Norvasc dose for tomorrow. Type 2 diabetes mellitus with hyperlipidemia on glargine insulin 23 units twice daily and short acting insulin prior to meals.  Continue atorvastatin History of pheochromocytoma status post removal right adrenal gland Depression on Lexapro      Code Status:     Code Status Orders  (From admission, onward)           Start     Ordered   01/19/21 1218  Full code  Continuous        01/19/21 1218           Code Status History     This patient has a current code status but no historical code status.       Inpatient status Case discussed with transitional care team and still waiting for insurance authorization for rehab.  Lori [name]

## 2021-01-30 NOTE — TOC Progression Note (Signed)
Transition of Care [facility]) - Progression Note    Patient Details  Name: Lori [name] MRN: [id number] Date of Birth: 12/01/1970  Transition of Care [facility]) CM/SW [place], LCSW Phone Number: 01/30/2021, 12:56 PM  Clinical Narrative:   Insurance authorization still pending.  Expected Discharge Plan: [place] (vs SNF) Barriers to Discharge: Continued Medical Work up  Expected Discharge Plan and Services Expected Discharge Plan: [place] (vs SNF)     Post Acute Care Choice: IP Rehab Living arrangements for the past 2 months: Apartment                                       Social Determinants of Health (SDOH) Interventions    Readmission Risk Interventions No flowsheet data found.

## 2021-01-30 NOTE — Progress Notes (Signed)
Physical Therapy Treatment Patient Details Name: Lori [name] MRN: [id number] DOB: December 03, 1970 Today's Date: 01/30/2021   History of Present Illness Pt is a 50 y/o F admitted on 01/19/21 after presenting with c/c of aphasia, N&V. MRI shows no acute intracranial pathology.Marland Kitchen PMH: HTN, HLD, DM, stroke with L sided weakness, thyroid disease, depression with anxiety, CAD s/p CABG, pheochromocytoma (s/p R adrenal gland removal)    PT Comments    Pt was pleasant and motivated to participate during the session and put forth good effort throughout. Pt required physical assistance with both bed mobility tasks and transfers and was able to perform a max of 3 sit to stands coming to full upright position and 2 sit to stands coming up to partial upright position.  Pt limited with bed mobility tasks by LLE weakness and attempted to use RLE to assist LLE in/out of bed but ultimately required physical assistance to manage the LLE. Pt will benefit from PT services in a SNF setting upon discharge to safely address deficits listed in patient problem list for decreased caregiver assistance and eventual return to PLOF.     Recommendations for follow up therapy are one component of a multi-disciplinary discharge planning process, led by the attending physician.  Recommendations may be updated based on patient status, additional functional criteria and insurance authorization.  Follow Up Recommendations  Skilled nursing-short term rehab (<3 hours/day)     Assistance Recommended at Discharge Frequent or constant Supervision/Assistance  Equipment Recommendations  None recommended by PT    Recommendations for Other Services       Precautions / Restrictions Precautions Precautions: Fall Precaution Comments: L hemi (UE>LE), L field cut Restrictions Weight Bearing Restrictions: No     Mobility  Bed Mobility Overal bed mobility: Needs Assistance Bed Mobility: Supine to Sit;Sit to Supine     Supine to  sit: Mod assist Sit to supine: Mod assist   General bed mobility comments: Mod A for LLE and trunk control    Transfers Overall transfer level: Needs assistance Equipment used: Rolling walker (2 wheels) Transfers: Sit to/from Stand Sit to Stand: Min assist;Mod assist;From elevated surface Stand pivot transfers: Mod assist         General transfer comment: Pt able to perform STS from elevated surface x 3 coming to full upright position and x 2 coming to partial upright position    Ambulation/Gait               General Gait Details: unable/ unsafe at this time   Stairs             Wheelchair Mobility    Modified Rankin (Stroke Patients Only)       Balance Overall balance assessment: Needs assistance Sitting-balance support: No upper extremity supported;Feet supported Sitting balance-Leahy Scale: Poor Sitting balance - Comments: Min A x 2 to prevent posterior LOB Postural control: Posterior lean Standing balance support: Single extremity supported Standing balance-Leahy Scale: Poor Standing balance comment: Requires MOD A to maintain static standing balance                            Cognition Arousal/Alertness: Awake/alert Behavior During Therapy: WFL for tasks assessed/performed Overall Cognitive Status: Within Functional Limits for tasks assessed  Exercises Total Joint Exercises Ankle Circles/Pumps: Strengthening;Both;10 reps;5 reps (with manual resistance) Quad Sets: Strengthening;Both;10 reps Heel Slides: AAROM;Strengthening;Both;10 reps;AROM (AAROM on the LLE) Hip ABduction/ADduction: AAROM;Strengthening;Both;10 reps;AROM (AAROM on the LLE) Long Arc Quad: AROM;Strengthening;Both;10 reps General Exercises - Upper Extremity Elbow Extension: AAROM;Self ROM;Left;Seated Wrist Flexion: AAROM;Self ROM;Left;10 reps;Seated Digit Composite Flexion: AAROM;Self ROM;10 reps;Seated Other  Exercises Other Exercises: Anterior weight shifting in sitting to address posterior instability    General Comments        Pertinent Vitals/Pain Pain Assessment: No/denies pain    Home Living                          Prior Function            PT Goals (current goals can now be found in the care plan section) Progress towards PT goals: PT to reassess next treatment    Frequency    7X/week      PT Plan Current plan remains appropriate    Co-evaluation              AM-PAC PT "6 Clicks" Mobility   Outcome Measure  Help needed turning from your back to your side while in a flat bed without using bedrails?: A Lot Help needed moving from lying on your back to sitting on the side of a flat bed without using bedrails?: A Lot Help needed moving to and from a bed to a chair (including a wheelchair)?: A Lot Help needed standing up from a chair using your arms (e.g., wheelchair or bedside chair)?: A Lot Help needed to walk in hospital room?: Total Help needed climbing 3-5 steps with a railing? : Total 6 Click Score: 10    End of Session Equipment Utilized During Treatment: Gait belt Activity Tolerance: Patient tolerated treatment well Patient left: in bed;with call bell/phone within reach;with bed alarm set;Other (comment) (Pt declined up in chair secondary to having recently gotten back to bed) Nurse Communication: Mobility status PT Visit Diagnosis: Unsteadiness on feet (R26.81);Muscle weakness (generalized) (M62.81);Hemiplegia and hemiparesis;Other abnormalities of gait and mobility (R26.89) Hemiplegia - Right/Left: Left Hemiplegia - dominant/non-dominant: Non-dominant Hemiplegia - caused by: Cerebral infarction     Time: 7262-0355 PT Time Calculation (min) (ACUTE ONLY): 23 min  Charges:  $Therapeutic Exercise: 8-22 mins $Therapeutic Activity: 8-22 mins                     D. Scott Glennice Marcos PT, DPT 01/30/21, 4:04 PM

## 2021-01-31 ENCOUNTER — Inpatient Hospital Stay: Payer: Medicare Other

## 2021-01-31 DIAGNOSIS — I639 Cerebral infarction, unspecified: Secondary | ICD-10-CM | POA: Diagnosis not present

## 2021-01-31 DIAGNOSIS — R4701 Aphasia: Secondary | ICD-10-CM | POA: Diagnosis not present

## 2021-01-31 DIAGNOSIS — E7849 Other hyperlipidemia: Secondary | ICD-10-CM

## 2021-01-31 DIAGNOSIS — I2510 Atherosclerotic heart disease of native coronary artery without angina pectoris: Secondary | ICD-10-CM | POA: Diagnosis not present

## 2021-01-31 DIAGNOSIS — K5909 Other constipation: Secondary | ICD-10-CM | POA: Diagnosis not present

## 2021-01-31 LAB — GLUCOSE, CAPILLARY
Glucose-Capillary: 172 mg/dL — ABNORMAL HIGH (ref 70–99)
Glucose-Capillary: 195 mg/dL — ABNORMAL HIGH (ref 70–99)
Glucose-Capillary: 177 mg/dL — ABNORMAL HIGH (ref 70–99)
Glucose-Capillary: 223 mg/dL — ABNORMAL HIGH (ref 70–99)

## 2021-01-31 MED ORDER — METHIMAZOLE 5 MG PO TABS
5.0000 mg | ORAL_TABLET | Freq: Every day | ORAL | Status: DC
  Administered 2021-01-31 – 2021-02-02 (×3): 5 mg via ORAL
  Filled 2021-01-31 (×3): qty 1

## 2021-01-31 MED ORDER — INSULIN GLARGINE-YFGN 100 UNIT/ML ~~LOC~~ SOLN
50.0000 [IU] | Freq: Every day | SUBCUTANEOUS | Status: DC
  Administered 2021-02-01 – 2021-02-02 (×2): 50 [IU] via SUBCUTANEOUS
  Filled 2021-01-31 (×2): qty 0.5

## 2021-01-31 NOTE — Progress Notes (Signed)
Patient transported off unit via bed by transport to radiology for swallow eval

## 2021-01-31 NOTE — Progress Notes (Addendum)
Occupational Therapy Treatment Patient Details Name: Ionna Avis MRN: [id number] DOB: 11/04/1970 Today's Date: 01/31/2021   History of present illness Pt is a 50 y/o F admitted on 01/19/21 after presenting with c/c of aphasia, N&V. MD assessment includes: Suspected acute stroke even though MRI of the brain did not show any acute stroke, multiple chronic microhemorrhages, and weakness.  PMH: HTN, HLD, DM, stroke with L sided weakness, thyroid disease, depression with anxiety, CAD s/p CABG, pheochromocytoma (s/p R adrenal gland removal).   OT comments  Pt seen for OT treatment on this date. Upon arrival to room, pt awake and seating upright in bed. Pt reporting feeling weaker than yesterday, however very motivated to participate in OT tx. Pt continues to require MOD A for bed mobility d/t decreased strength and balance. This date, pt sat EOB for 45min, requiring MIN A for dynamic sitting balance, while engaging in seated grooming tasks. Pt required MIN A for oral care (via oral swab) and MAX A to don wig. Pt attempted sit>stand transfer, however unable to clear hips from bed this date d/t decreased strength and activity tolerance. At end of session, pt left in chair position in bed, with all needs within reach. Pt is making good progress toward goals and continues to benefit from skilled OT services to maximize return to PLOF and minimize risk of future falls, injury, caregiver burden, and readmission. Will continue to follow POC. Discharge recommendation remains appropriate.     Recommendations for follow up therapy are one component of a multi-disciplinary discharge planning process, led by the attending physician.  Recommendations may be updated based on patient status, additional functional criteria and insurance authorization.    Follow Up Recommendations  Skilled nursing-short term rehab (<3 hours/day)    Assistance Recommended at Discharge Frequent or constant Supervision/Assistance   Equipment Recommendations  Other (comment) (defer to next venue of care)       Precautions / Restrictions Precautions Precautions: Fall Precaution Comments: L hemi (UE>LE), L field cut Restrictions Weight Bearing Restrictions: No       Mobility Bed Mobility Overal bed mobility: Needs Assistance Bed Mobility: Supine to Sit;Sit to Supine     Supine to sit: Mod assist Sit to supine: Mod assist   General bed mobility comments: Mod A for LLE and trunk control    Transfers Overall transfer level: Needs assistance                Lateral/Scoot Transfers: Mod assist General transfer comment: Pt able to perform several small lateral scoots but was unable to make it into the drop-arm recliner this session.     Balance Overall balance assessment: Needs assistance Sitting-balance support: No upper extremity supported;Feet supported Sitting balance-Leahy Scale: Poor Sitting balance - Comments: MIN A to maintain dynamic sitting balance during seated grooming tasks Postural control: Posterior lean                                 ADL either performed or assessed with clinical judgement   ADL Overall ADL's : Needs assistance/impaired     Grooming: Wash/dry face;Oral care;Minimal assistance;Brushing hair;Maximal assistance;Sitting Grooming Details (indicate cue type and reason): Required cues and MIN A to to maintain balance at EOB (d/t left lateral lean). Also required MIN A to engage LUE in oral care tasks and MAX A to don wig  Cognition Arousal/Alertness: Awake/alert Behavior During Therapy: WFL for tasks assessed/performed Overall Cognitive Status: Within Functional Limits for tasks assessed                                                       Pertinent Vitals/ Pain       Pain Assessment: Faces Faces Pain Scale: Hurts a little bit Pain Location: lower back Pain Descriptors /  Indicators: Aching Pain Intervention(s): Limited activity within patient's tolerance;Monitored during session;Repositioned         Frequency  Min 3X/week        Progress Toward Goals  OT Goals(current goals can now be found in the care plan section)  Progress towards OT goals: Progressing toward goals  Acute Rehab OT Goals Patient Stated Goal: to get stronger OT Goal Formulation: With patient Time For Goal Achievement: 02/03/21 Potential to Achieve Goals: Good  Plan Discharge plan remains appropriate;Frequency remains appropriate       AM-PAC OT "6 Clicks" Daily Activity     Outcome Measure   Help from another person eating meals?: A Little Help from another person taking care of personal grooming?: A Little Help from another person toileting, which includes using toliet, bedpan, or urinal?: A Lot Help from another person bathing (including washing, rinsing, drying)?: A Lot Help from another person to put on and taking off regular upper body clothing?: A Lot Help from another person to put on and taking off regular lower body clothing?: Total 6 Click Score: 13    End of Session    OT Visit Diagnosis: Unsteadiness on feet (R26.81);Muscle weakness (generalized) (M62.81);Other symptoms and signs involving the nervous system (R29.898);Hemiplegia and hemiparesis Hemiplegia - Right/Left: Left Hemiplegia - dominant/non-dominant: Non-Dominant Hemiplegia - caused by: Cerebral infarction   Activity Tolerance Patient tolerated treatment well   Patient Left in bed;with call bell/phone within reach;with bed alarm set   Nurse Communication Mobility status        Time: 1455-1520 OT Time Calculation (min): 25 min  Charges: OT General Charges $OT Visit: 1 Visit OT Treatments $Self Care/Home Management : 23-37 mins  Fredirick Maudlin, OTR/L [place]

## 2021-01-31 NOTE — Progress Notes (Signed)
Patient returned from radiology in stable condition.

## 2021-01-31 NOTE — Progress Notes (Signed)
Physical Therapy Treatment Patient Details Name: Lori [name] MRN: [id number] DOB: 10/17/1970 Today's Date: 01/31/2021   History of Present Illness Pt is a 50 y/o F admitted on 01/19/21 after presenting with c/c of aphasia, N&V. MD assessment includes: Suspected acute stroke even though MRI of the brain did not show any acute stroke, multiple chronic microhemorrhages, and weakness.  PMH: HTN, HLD, DM, stroke with L sided weakness, thyroid disease, depression with anxiety, CAD s/p CABG, pheochromocytoma (s/p R adrenal gland removal).    PT Comments    Pt was pleasant and motivated to participate during the session and put forth good effort throughout. Pt required mod A with bed mobility tasks as well as for limited lateral scooting this session. Attempted to laterally scoot to recliner but pt was unable to perform more than 3-4 very small 1-2" scoots before becoming too fatigued to continue and required assistance back into bed.  Pt reported no adverse symptoms during the session with SpO2 and HR WNL on room air.  Pt will benefit from PT services in a SNF setting upon discharge to safely address deficits listed in patient problem list for decreased caregiver assistance and eventual return to PLOF.     Recommendations for follow up therapy are one component of a multi-disciplinary discharge planning process, led by the attending physician.  Recommendations may be updated based on patient status, additional functional criteria and insurance authorization.  Follow Up Recommendations  Skilled nursing-short term rehab (<3 hours/day)     Assistance Recommended at Discharge Frequent or constant Supervision/Assistance  Equipment Recommendations  None recommended by PT    Recommendations for Other Services       Precautions / Restrictions Precautions Precautions: Fall Precaution Comments: L hemi (UE>LE), L field cut Restrictions Weight Bearing Restrictions: No     Mobility  Bed  Mobility Overal bed mobility: Needs Assistance Bed Mobility: Supine to Sit;Sit to Supine     Supine to sit: Mod assist Sit to supine: Mod assist   General bed mobility comments: Mod A for LLE and trunk control    Transfers                  Lateral/Scoot Transfers: Mod assist General transfer comment: Pt able to perform several small lateral scoots but was unable to make it into the drop-arm recliner this session.    Ambulation/Gait               General Gait Details: unable/ unsafe at this time   Stairs             Wheelchair Mobility    Modified Rankin (Stroke Patients Only)       Balance Overall balance assessment: Needs assistance Sitting-balance support: No upper extremity supported;Feet supported Sitting balance-Leahy Scale: Fair                                      Cognition Arousal/Alertness: Awake/alert Behavior During Therapy: WFL for tasks assessed/performed Overall Cognitive Status: Within Functional Limits for tasks assessed                                          Exercises Total Joint Exercises Ankle Circles/Pumps: Strengthening;Both;10 reps Quad Sets: Strengthening;Both;10 reps Hip ABduction/ADduction: AAROM;Strengthening;Both;10 reps;AROM Straight Leg Raises: AROM;AAROM;Strengthening;Both;5 reps Other Exercises Other Exercises: Anterior weight  shifting in sitting to address posterior instability    General Comments        Pertinent Vitals/Pain Pain Assessment: No/denies pain    Home Living                          Prior Function            PT Goals (current goals can now be found in the care plan section) Progress towards PT goals: Not progressing toward goals - comment (limited by functional weakness)    Frequency    7X/week      PT Plan Current plan remains appropriate    Co-evaluation              AM-PAC PT "6 Clicks" Mobility   Outcome Measure   Help needed turning from your back to your side while in a flat bed without using bedrails?: A Lot Help needed moving from lying on your back to sitting on the side of a flat bed without using bedrails?: A Lot Help needed moving to and from a bed to a chair (including a wheelchair)?: A Lot Help needed standing up from a chair using your arms (e.g., wheelchair or bedside chair)?: A Lot Help needed to walk in hospital room?: Total Help needed climbing 3-5 steps with a railing? : Total 6 Click Score: 10    End of Session Equipment Utilized During Treatment: Gait belt Activity Tolerance: Patient tolerated treatment well Patient left: in bed;with call bell/phone within reach;with bed alarm set Nurse Communication: Mobility status PT Visit Diagnosis: Unsteadiness on feet (R26.81);Muscle weakness (generalized) (M62.81);Hemiplegia and hemiparesis;Other abnormalities of gait and mobility (R26.89) Hemiplegia - Right/Left: Left Hemiplegia - dominant/non-dominant: Non-dominant Hemiplegia - caused by: Cerebral infarction     Time: 1350-1413 PT Time Calculation (min) (ACUTE ONLY): 23 min  Charges:  $Therapeutic Exercise: 8-22 mins $Therapeutic Activity: 8-22 mins                     D. Scott Ahja Martello PT, DPT 01/31/21, 2:25 PM

## 2021-01-31 NOTE — TOC Progression Note (Addendum)
Transition of Care [facility] - Progression Note    Patient Details  Name: Lori [name] MRN: [id number] Date of Birth: 12-27-1970  Transition of Care [facility]) CM/SW [place], LCSW Phone Number: 01/31/2021, 10:04 AM  Clinical Narrative:  Insurance authorization still pending. SNF admissions coordinator confirmed she will not have to sign over her check until SNF days are up. Told her plan is to go home with daughter after rehab.   3:09 pm: Insurance authorization still pending.  Expected Discharge Plan: [place] (vs SNF) Barriers to Discharge: Continued Medical Work up  Expected Discharge Plan and Services Expected Discharge Plan: [place] (vs SNF)     Post Acute Care Choice: IP Rehab Living arrangements for the past 2 months: Apartment                                       Social Determinants of Health (SDOH) Interventions    Readmission Risk Interventions No flowsheet data found.

## 2021-01-31 NOTE — Progress Notes (Addendum)
PROGRESS NOTE  Lori [name]    DOB: 1971/01/14, 50 y.o.  URK:[id number]  PCP: Pcp, No   Code Status: Full Code   DOA: 01/19/2021   LOS: 12  Brief Narrative of Current Hospitalization  Lori [name] is a 50 y.o. female with a PMH significant for HTN, HLD, type II DM, stroke with residual left-sided deficits, thyroid disease, depression/anxiety, CAD s/p CABG, history of pheochromocytoma s/p adrenal removal. They presented from home to the ED on 01/19/2021 with speech difficulty and bilateral leg weakness since the morning of admission. In the ED, it was found that they had no acute signs of stroke on CT or MRI, however, there are multiple chronic microhemorrhages.  Neurology was consulted.  Signed off.  Patient endorsed having history of brain tumor which is being followed up outpatient by neurology. They were treated with risk-management for stroke prevention and therapeutic evaluations from PT/OT/SLP.  Patient was admitted to medicine service for further workup and management of subacute strokes as outlined in detail below.  01/31/21 -stable, ready for discharge to SNF when bed available  Assessment & Plan  Principal Problem:   Stroke [facility]) Active Problems:   Tachycardia   Diabetes mellitus without complication ([place])   Thyroid disease   Essential hypertension   CAD (coronary artery disease)   Pheochromocytoma   Depression   HLD (hyperlipidemia)   Nausea & vomiting   CVA (cerebral vascular accident) ([place])   Aphasia   Spell of abnormal behavior   Weakness   Type 2 diabetes mellitus with hyperlipidemia ([place])   Constipation  Subacute strokes not seen on imaging but having residual dysphagia and weakness. -Neurology has signed off -Continue with aspirin, Brilinta, atorvastatin -Follow-up with neurology outpatient for stroke/history of brain tumor.  Dr. Quinn Axe is aware. -PT/OT/SLP  Sinus tachycardia- controlled in 80-100 on current therapy - continue metoprolol- can  likely titrate down with thyroid therapy.  Hyperthyroid- mildly low TSH and elevated T4 on admission. Not on home medications. Likely contributing to her tachycardia.  - needs thyroid US OP - initiating methimazole  HTN-  - continue home amlodipine. - discontinue all PRN BP medications as they are shown to not have any benefit on patient mortality and have increased negative outcomes while inpatient.   Constipation - miralax daily.  - discontinue all docusate containing therapies as they are shown to be counter-productive  - lactulose PRN  Type II DM- moderately well controlled while inpatient - changed long-acting to once daily dosing in am for better safety profile - continue sSSI  Anxiety/depression-chronic, stable -Continue home Lexapro  DVT prophylaxis: heparin injection 5,000 Units Start: 01/19/21 1400   Diet:  Diet Orders (From admission, onward)     Start     Ordered   01/25/21 1303  DIET DYS 3 Room service appropriate? Yes with Assist; Fluid consistency: Nectar Thick  Diet effective now       Comments: Please send only fruits and veggies on trays -- may need to talk w/ the Dietician re: her dietary needs. Pt has requested no animal products.  Pt can have Cut Pineapple, baked potato and baked sweet potato per Speech ok!  Wants FISH at lunch/dinner meals.  NO RICE.  Question Answer Comment  Room service appropriate? Yes with Assist   Fluid consistency: Nectar Thick      01/25/21 1304            Subjective 01/31/21    Pt reports no complaints today. She is ready for SNF.  Disposition Plan & Communication  Patient status: Inpatient  Admitted From: Home Disposition: Skilled nursing facility Anticipated discharge date: 12/29  Family Communication: none  Consults, Procedures, Significant Events  Consultants:  Neurology   Procedures/significant events:  None  Antimicrobials:  Anti-infectives (From admission, onward)    None       Objective    Vitals:   01/30/21 1138 01/30/21 1616 01/30/21 1930 01/31/21 0421  BP: 105/78 105/82 124/80 105/80  Pulse: 95 95 (!) 107 90  Resp: 19 17 19 17   Temp: 98.3 F (36.8 C) (!) 97.5 F (36.4 C) 98.8 F (37.1 C) 98 F (36.7 C)  TempSrc: Oral Oral Oral   SpO2: 99% 99% 99% 96%  Weight:      Height:        Intake/Output Summary (Last 24 hours) at 01/31/2021 0713 Last data filed at 01/31/2021 0420 Gross per 24 hour  Intake 600 ml  Output 950 ml  Net -350 ml   Filed Weights   01/19/21 0838 01/20/21 0049  Weight: 102.6 kg 97.7 kg    Patient BMI: Body mass index is 39.4 kg/m.   Physical Exam:  General: awake, alert, NAD HEENT: atraumatic, clear conjunctiva, anicteric sclera, MMM, hearing grossly normal Respiratory: normal respiratory effort. Cardiovascular: normal S1/S2, RRR, no JVD, murmurs, quick capillary refill  Nervous: A&O x3. no gross focal neurologic deficits, dysarthria. Decreased ROM of left hand- fingers contracted Extremities: no edema, normal tone Skin: dry, intact, normal temperature, normal color. No rashes, lesions or ulcers on exposed skin Psychiatry: normal mood, congruent affect  Labs   I have personally reviewed following labs and imaging studies CBC    Component Value Date/Time   WBC 13.5 (H) 01/29/2021 0525   RBC 4.46 01/29/2021 0525   HGB 11.8 (L) 01/29/2021 0525   HCT 35.4 (L) 01/29/2021 0525   PLT 339 01/29/2021 0525   MCV 79.4 (L) 01/29/2021 0525   MCH 26.5 01/29/2021 0525   MCHC 33.3 01/29/2021 0525   RDW 15.1 01/29/2021 0525   LYMPHSABS 1.0 01/22/2021 0850   MONOABS 1.1 (H) 01/22/2021 0850   EOSABS 0.0 01/22/2021 0850   BASOSABS 0.0 01/22/2021 0850   BMP Latest Ref Rng & Units 01/29/2021 01/25/2021 01/22/2021  Glucose 70 - 99 mg/dL 119(H) 210(H) 395(H)  BUN 6 - 20 mg/dL 9 17 26(H)  Creatinine 0.44 - 1.00 mg/dL 0.80 0.75 0.89  Sodium 135 - 145 mmol/L 137 137 139  Potassium 3.5 - 5.1 mmol/L 4.1 3.9 3.9  Chloride 98 - 111 mmol/L 105 104  107  CO2 22 - 32 mmol/L 27 27 24   Calcium 8.9 - 10.3 mg/dL 8.2(L) 8.5(L) 8.7(L)   Imaging Studies  No results found.  Medications   Scheduled Meds:   stroke: mapping our early stages of recovery book   Does not apply Once   amLODipine  5 mg Oral Daily   aspirin  81 mg Oral Daily   atorvastatin  80 mg Oral Daily   chlorhexidine  15 mL Mouth Rinse BID   Chlorhexidine Gluconate Cloth  6 each Topical Daily   cyclobenzaprine  5 mg Oral TID   docusate sodium  200 mg Oral QHS   escitalopram  20 mg Oral Daily   feeding supplement (NEPRO CARB STEADY)  237 mL Oral TID BM   gabapentin  200 mg Oral TID   heparin  5,000 Units Subcutaneous Q8H   insulin aspart  0-5 Units Subcutaneous QHS   insulin aspart  0-9 Units Subcutaneous TID  WC   insulin aspart  4 Units Subcutaneous TID WC   insulin glargine-yfgn  23 Units Subcutaneous BID   lisinopril  20 mg Oral Daily   loratadine  10 mg Oral Daily   LORazepam  0.25 mg Oral QHS   magic mouthwash w/lidocaine  15 mL Oral QID   mouth rinse  15 mL Mouth Rinse q12n4p   melatonin  10 mg Oral QHS   metoCLOPramide  10 mg Oral TID AC   metoprolol tartrate  100 mg Oral BID   multivitamin with minerals  1 tablet Oral Daily   pantoprazole  40 mg Oral BID   polyethylene glycol  17 g Oral Daily   sodium chloride flush  3 mL Intravenous Once   ticagrelor  90 mg Oral BID   No recently discontinued medications to reconcile  LOS: 12 days   Richarda Osmond, DO [facility] 01/31/2021, 7:13 AM   Available by Epic secure chat 7AM-7PM. If 7PM-7AM, please contact night-coverage Refer to [email or web address] to contact the [facility] Attending or Consulting provider for this pt

## 2021-01-31 NOTE — Progress Notes (Addendum)
Objective Swallowing Evaluation: Type of Study: MBS-Modified Barium Swallow Study   Patient Details  Name: Lori [name] MRN: [id number] Date of Birth: 1970-09-25  Today's Date: 01/31/2021 Time: SLP Start Time (ACUTE ONLY): 1000 -SLP Stop Time (ACUTE ONLY): 1030  SLP Time Calculation (min) (ACUTE ONLY): 30 min   Past Medical History:  Past Medical History:  Diagnosis Date   CAD (coronary artery disease)    Depression    Diabetes mellitus without complication ([place])    Hypertension    Pheochromocytoma    Pheochromocytoma    Stroke [facility])    Thyroid disease    Past Surgical History:  Past Surgical History:  Procedure Laterality Date   CORONARY ARTERY BYPASS GRAFT     Right adrenal gland removal for pheochromocytoma Right    HPI: Per Hospitalist note, "50 y.o. female with medical history significant of hypertension, hyperlipidemia, diabetes mellitus, stroke with left-sided weakness, thyroid disease (patient does not know which type of thyroid disease), depression with anxiety, CAD, s/p of CABG, pheochromocytoma (s/p of right adrenal gland removal per patient), who presents with aphasia, nausea and vomiting.  Patient has history of stroke with left-sided weakness.  At her normal baseline, patient is able to perform ADLs, typically independently with assistance of a walker.  Patient was last known normal at 10 PM last night 01/18/21. Today she started having difficult speaking at about 6 AM.  She also reports bilateral leg weakness, cannot walk due to weakness.  Patient denies chest pain, cough, shortness of breath.  No fever or chills.  No symptoms of UTI.  She states that she has nausea, and has vomited at least 8 times with bilious and greenish colored vomitus.  Denies diarrhea or abdominal pain.  Patient was found to have sinus tachycardia with heart rate up to 160s, patient was given IV metoprolol in the ED.  Heart rate improved to 120s. Heart rate has improved however patient  remains tachycardic and hypertensive.  She is mentating clearly but does have significant soft tissue neck swelling.  Allergic reaction but unknown trigger.  Patient attributes it to known iodine contrast allergy.  Maintaining airway with clear lungs.".    Per Neurology report of EEG: "Impression and clinical correlation: This EEG was obtained while awake and asleep and is abnormal due to mild diffuse slowing indicative of global cerebral dysfunction. Subtle left frontal focal slowing indicated superimposed focal dysfunction in that area.".   CXR at admit: "No active disease.".   Subjective: Alert, reports eager to drink thin liquids. Lip swelling has reduced, voice is less hoarse but low intensity    Recommendations for follow up therapy are one component of a multi-disciplinary discharge planning process, led by the attending physician.  Recommendations may be updated based on patient status, additional functional criteria and insurance authorization.  Assessment / Plan / Recommendation  Clinical Impressions 01/31/2021  Clinical Impression Patient continues with moderate oropharyngeal dysphagia without changes in deficits of swallow timing, sensation, or airway protection as seen on MBS 01/23/21. Pt reports feeling of "throat swelling" has subsided, and lip swelling has reduced significantly. RN reports pt has been non-compliant with liquid recommendations; has been refusing pills unless given whole (vs crushed) in puree, and eating [place] ices despite education on liquid precautions. Pt reports onset of left-sided chest pain when coughing. Per review of vitals, has been afebrile. Hoarse vocal quality has improved, however pt with ongoing hypophonia as well as slight hypernasal quality to resonance. Along with impaired timing of swallow initiation  and impaired laryngeal sensation, this may indicate CN X impairments. ENT consult may assist in further assessment of laryngeal function pertaining to both  swallow and voicing. Oral stage of swallowing is characterized by adequate bolus hold but slow, disorganized lingual transport. Mastication with solids occurs with munching pattern (anterior) vs rotary chewing given absent rear dentition. Swallow initiation is delayed to the level of the pyriform sinuses with liquids. There is deep laryngeal penetration during the swallow with thin liquids, with silent aspiration post-swallow (one instance of delayed sensation). Cued cough: patient was unable to follow the command consistently and was unable to clear the airway when she did follow this command. Airway protection is improved with nectar thick liquids (one instance of trace shallow penetration which cleared with throat clear, subsequent swallow). Compensatory maneuvers attempted with thins included bolus hold, chin tuck, and supraglottic swallow, however these did not prevent aspiration with thin liquids. Given pt non-compliance with pills crushed, assessed with 13 mm barium tablet whole in puree. Pt able to clear this without difficulty. Does appear to have slightly improved base of tongue retraction, hyolaryngeal excursion, as there was minimal pharyngeal residue post-swallow (mild vallecular after puree, solid). Noted with slowed clearance through the cervical esophagus with intermittent retrograde flow with al consistencies, which remained below the level of the pharyngoesophageal segment. After the study pt was educated regarding findings and recommendation for continuing dysphagia 3, nectar liquids given acute illness, decreased functional reserve, and risk for aspiration. Patient was at times verbalized resistance, intent for non-compliance. Encouraged pt to follow recommendations at least as a temporary measure until medical status improves and she is able to begin working on rehabilitation. Educated provided pt that SLP simply makes recommendation, and that she may choose not to follow recommendations however  this increases risk for aspiration, aspiration pneumonia, increased length of stay, and worsening respiratory status. Patient verbalized understanding and expressed desire to comply with recommendations. Nsg updated via secure chat.   SLP Visit Diagnosis Dysphagia, oropharyngeal phase (R13.12)  Impact on safety and function Moderate aspiration risk;Risk for inadequate nutrition/hydration      Treatment Recommendations 01/31/2021  Treatment Recommendations Therapy as outlined in treatment plan below     Prognosis 01/31/2021  Prognosis for Safe Diet Advancement Fair  Barriers to Reach Goals Cognitive deficits;Time post onset;Severity of deficits;Other (Comment)  Barriers/Prognosis Comment --    Diet Recommendations 01/31/2021  SLP Diet Recommendations Dysphagia 3 (Mech soft) solids;Nectar thick liquid  Liquid Administration via Straw  Medication Administration Whole meds with puree  Compensations Minimize environmental distractions;Slow rate;Small sips/bites;Lingual sweep for clearance of pocketing;Multiple dry swallows after each bite/sip;Follow solids with liquid;Clear throat intermittently  Postural Changes Remain semi-upright after after feeds/meals (Comment);Seated upright at 90 degrees      Other Recommendations 01/31/2021  Recommended Consults Consider ENT evaluation  Oral Care Recommendations Oral care BID;Oral care before and after PO;Staff/trained caregiver to provide oral care  Other Recommendations Order thickener from pharmacy;Prohibited food (jello, ice cream, thin soups);Remove water pitcher;Have oral suction available  Follow Up Recommendations Skilled nursing-short term rehab (<3 hours/day)  Assistance recommended at discharge Frequent or constant Supervision/Assistance  Functional Status Assessment Patient has had a recent decline in their functional status and demonstrates the ability to make significant improvements in function in a reasonable and predictable amount of  time.    Frequency and Duration  01/31/2021  Speech Therapy Frequency (ACUTE ONLY) --  Treatment Duration 2 weeks      Oral Phase 01/31/2021  Oral Phase Impaired  Oral - Pudding Teaspoon --  Oral - Pudding Cup --  Oral - Honey Teaspoon --  Oral - Honey Cup --  Oral - Nectar Teaspoon Reduced posterior propulsion  Oral - Nectar Cup --  Oral - Nectar Straw Reduced posterior propulsion  Oral - Thin Teaspoon Reduced posterior propulsion  Oral - Thin Cup --  Oral - Thin Straw Reduced posterior propulsion  Oral - Puree Weak lingual manipulation;Reduced posterior propulsion;Lingual/palatal residue  Oral - Mech Soft Weak lingual manipulation;Impaired mastication;Reduced posterior propulsion;Lingual/palatal residue;Piecemeal swallowing;Delayed oral transit;Decreased bolus cohesion  Oral - Regular --  Oral - Multi-Consistency --  Oral - Pill --  Oral Phase - Comment --    Pharyngeal Phase 01/31/2021  Pharyngeal Phase Impaired  Pharyngeal- Pudding Teaspoon --  Pharyngeal --  Pharyngeal- Pudding Cup --  Pharyngeal --  Pharyngeal- Honey Teaspoon --  Pharyngeal --  Pharyngeal- Honey Cup --  Pharyngeal --  Pharyngeal- Nectar Teaspoon Delayed swallow initiation-pyriform sinuses  Pharyngeal Material does not enter airway  Pharyngeal- Nectar Cup --  Pharyngeal --  Pharyngeal- Nectar Straw Delayed swallow initiation-pyriform sinuses;Penetration/Aspiration during swallow  Pharyngeal Material enters airway, remains ABOVE vocal cords and not ejected out  Pharyngeal- Thin Teaspoon Delayed swallow initiation-pyriform sinuses;Penetration/Aspiration during swallow;Trace aspiration  Pharyngeal Material enters airway, passes BELOW cords without attempt by patient to eject out (silent aspiration)  Pharyngeal- Thin Cup --  Pharyngeal --  Pharyngeal- Thin Straw Delayed swallow initiation-pyriform sinuses;Penetration/Aspiration during swallow;Moderate aspiration  Pharyngeal Material enters airway,  passes BELOW cords without attempt by patient to eject out (silent aspiration)  Pharyngeal- Puree Delayed swallow initiation-vallecula;Reduced tongue base retraction;Pharyngeal residue - valleculae  Pharyngeal Material does not enter airway  Pharyngeal- Mechanical Soft Delayed swallow initiation-vallecula;Reduced tongue base retraction;Pharyngeal residue - valleculae  Pharyngeal Material does not enter airway  Pharyngeal- Regular --  Pharyngeal --  Pharyngeal- Multi-consistency --  Pharyngeal --  Pharyngeal- Pill Delayed swallow initiation-vallecula  Pharyngeal Material does not enter airway  Pharyngeal Comment --     Cervical Esophageal Phase  01/31/2021  Cervical Esophageal Phase Impaired  Pudding Teaspoon --  Pudding Cup --  Honey Teaspoon --  Honey Cup --  Nectar Teaspoon Esophageal backflow into cervical esophagus  Nectar Cup --  Nectar Straw Esophageal backflow into cervical esophagus  Thin Teaspoon Esophageal backflow into cervical esophagus  Thin Cup --  Thin Straw Esophageal backflow into cervical esophagus  Puree Esophageal backflow into cervical esophagus  Mechanical Soft Esophageal backflow into cervical esophagus  Regular --  Multi-consistency --  Pill --  Cervical Esophageal Comment --   Deneise Lever, MS, CCC-SLP Speech-Language Pathologist   Aliene Altes 01/31/2021, 1:02 PM

## 2021-02-01 DIAGNOSIS — F320 Major depressive disorder, single episode, mild: Secondary | ICD-10-CM

## 2021-02-01 DIAGNOSIS — D3500 Benign neoplasm of unspecified adrenal gland: Secondary | ICD-10-CM

## 2021-02-01 LAB — RESP PANEL BY RT-PCR (FLU A&B, COVID) ARPGX2
Influenza A by PCR: NEGATIVE
Influenza B by PCR: NEGATIVE
SARS Coronavirus 2 by RT PCR: NEGATIVE

## 2021-02-01 LAB — GLUCOSE, CAPILLARY
Glucose-Capillary: 151 mg/dL — ABNORMAL HIGH (ref 70–99)
Glucose-Capillary: 141 mg/dL — ABNORMAL HIGH (ref 70–99)
Glucose-Capillary: 59 mg/dL — ABNORMAL LOW (ref 70–99)
Glucose-Capillary: 96 mg/dL (ref 70–99)
Glucose-Capillary: 156 mg/dL — ABNORMAL HIGH (ref 70–99)

## 2021-02-01 MED ORDER — ZOLPIDEM TARTRATE 5 MG PO TABS: 5.0000 mg | ORAL_TABLET | Freq: Every evening | ORAL | 0 refills | Status: DC | PRN

## 2021-02-01 MED ORDER — INSULIN DETEMIR 100 UNIT/ML FLEXPEN: 50.0000 [IU] | Freq: Every day | SUBCUTANEOUS | Status: DC

## 2021-02-01 MED ORDER — LISINOPRIL 20 MG PO TABS: 20.0000 mg | ORAL_TABLET | Freq: Every day | ORAL | Status: DC

## 2021-02-01 MED ORDER — METOPROLOL TARTRATE 100 MG PO TABS: 100.0000 mg | ORAL_TABLET | Freq: Two times a day (BID) | ORAL | Status: DC

## 2021-02-01 MED ORDER — METOCLOPRAMIDE HCL 10 MG PO TABS: 10.0000 mg | ORAL_TABLET | Freq: Three times a day (TID) | ORAL | Status: DC

## 2021-02-01 MED ORDER — AMLODIPINE BESYLATE 5 MG PO TABS: 5.0000 mg | ORAL_TABLET | Freq: Every day | ORAL | Status: DC

## 2021-02-01 MED ORDER — LORAZEPAM 0.5 MG PO TABS: 0.2500 mg | ORAL_TABLET | Freq: Every day | ORAL | 0 refills | Status: DC

## 2021-02-01 MED ORDER — ASPIRIN 81 MG PO CHEW: 81.0000 mg | CHEWABLE_TABLET | Freq: Every day | ORAL | Status: DC

## 2021-02-01 MED ORDER — LORATADINE 10 MG PO TABS: 10.0000 mg | ORAL_TABLET | Freq: Every day | ORAL | Status: DC

## 2021-02-01 MED ORDER — METHIMAZOLE 5 MG PO TABS: 5.0000 mg | ORAL_TABLET | Freq: Every day | ORAL | Status: DC

## 2021-02-01 NOTE — Progress Notes (Signed)
Occupational Therapy Treatment Patient Details Name: Lori [name] MRN: [id number] DOB: December 11, 1970 Today's Date: 02/01/2021   History of present illness Pt is a 50 y/o F admitted on 01/19/21 after presenting with c/c of aphasia, N&V. MD assessment includes: Suspected acute stroke even though MRI of the brain did not show any acute stroke, multiple chronic microhemorrhages, and weakness.  PMH: HTN, HLD, DM, stroke with L sided weakness, thyroid disease, depression with anxiety, CAD s/p CABG, pheochromocytoma (s/p R adrenal gland removal).   OT comments  Chart reviewed, pt greeted in bed agreeable to OT tx session. Co-tx completed with pt due to physical assistance required for functional mobility. Pt performed supine>sit with MOD A x2, STS with MOD A x2, SPT to bedside chair with MOD Ax2. Pt required MAX A x1 for peri care, SET UP for seated grooming tasks (washing face, hands); MOD A for deodorant application with pt educated to perform LUE AAROM. Pt demonstrated LUE AAROM shoulder flexion, elbow flexion/extension; educated on wrist/flexion extension. Pt is left in bedside chair, NAD, all needs met. OT will continue to follow while admitted.    Recommendations for follow up therapy are one component of a multi-disciplinary discharge planning process, led by the attending physician.  Recommendations may be updated based on patient status, additional functional criteria and insurance authorization.    Follow Up Recommendations  Skilled nursing-short term rehab (<3 hours/day)    Assistance Recommended at Discharge Frequent or constant Supervision/Assistance  Equipment Recommendations   (per next venue of care)    Recommendations for Other Services      Precautions / Restrictions Precautions Precautions: Fall Precaution Comments: L hemi (UE>LE), L field cut       Mobility Bed Mobility Overal bed mobility: Needs Assistance Bed Mobility: Supine to Sit Rolling: Mod assist;+2 for  physical assistance              Transfers Overall transfer level: Needs assistance Equipment used: 2 person hand held assist Transfers: Sit to/from Stand;Bed to chair/wheelchair/BSC Sit to Stand: Mod assist;+2 physical assistance Stand pivot transfers: Mod assist;+2 physical assistance               Balance Overall balance assessment: Needs assistance Sitting-balance support: No upper extremity supported;Feet supported Sitting balance-Leahy Scale: Poor Sitting balance - Comments: MIN A for dynamic sitting balance tasks Postural control: Posterior lean   Standing balance-Leahy Scale: Poor                             ADL either performed or assessed with clinical judgement   ADL Overall ADL's : Needs assistance/impaired     Grooming: Wash/dry hands;Wash/dry face;Sitting;Minimal Scientist, research (physical sciences): Moderate assistance;+2 for physical assistance Toilet Transfer Details (indicate cue type and reason): simulated to bedside chair Toileting- Clothing Manipulation and Hygiene: Maximal assistance;Sit to/from stand;+2 for physical assistance Toileting - Clothing Manipulation Details (indicate cue type and reason): peri care              Cognition Arousal/Alertness: Awake/alert Behavior During Therapy: WFL for tasks assessed/performed Overall Cognitive Status: Within Functional Limits for tasks assessed  Pertinent Vitals/ Pain       Pain Assessment: 0-10 Pain Score: 0-No pain   Frequency  Min 3X/week        Progress Toward Goals  OT Goals(current goals can now be found in the care plan section)  Progress towards OT goals: Progressing toward goals  Acute Rehab OT Goals Patient Stated Goal: to go to rehab OT Goal Formulation: With patient Time For Goal Achievement: 02/15/21 Potential to Achieve Goals: Good  Plan Discharge plan remains  appropriate;Frequency remains appropriate    Co-evaluation      Reason for Co-Treatment: Complexity of the patient's impairments (multi-system involvement);For patient/therapist safety;To address functional/ADL transfers   OT goals addressed during session: ADL's and self-care      AM-PAC OT "6 Clicks" Daily Activity     Outcome Measure   Help from another person eating meals?: A Little Help from another person taking care of personal grooming?: A Little Help from another person toileting, which includes using toliet, bedpan, or urinal?: A Lot Help from another person bathing (including washing, rinsing, drying)?: A Lot Help from another person to put on and taking off regular upper body clothing?: A Lot Help from another person to put on and taking off regular lower body clothing?: A Lot 6 Click Score: 14    End of Session Equipment Utilized During Treatment: Gait belt  OT Visit Diagnosis: Unsteadiness on feet (R26.81);Muscle weakness (generalized) (M62.81);Other symptoms and signs involving the nervous system (R29.898);Hemiplegia and hemiparesis   Activity Tolerance     Patient Left in chair;with call bell/phone within reach;with chair alarm set   Nurse Communication Mobility status        Time: 0511-0211 OT Time Calculation (min): 25 min  Charges: OT General Charges $OT Visit: 1 Visit OT Treatments $Self Care/Home Management : 8-22 mins  Shanon Payor, OTD OTR/L  02/01/21, 1:02 PM

## 2021-02-01 NOTE — Discharge Summary (Addendum)
Physician Discharge Summary  Lori [name] ZMO:[id number] DOB: October 07, 1970 DOA: 01/19/2021  PCP: Merryl Hacker, No  Admit date: 01/19/2021 Discharge date: 02/01/2021  Admitted From: Home Disposition: Skilled nursing facility  Recommendations for Outpatient Follow-up:  Follow up with PCP within 1-2 weeks to monitor blood pressure as medications have been titrated as listed below Follow up on thyroid function and recommend thyroid US as patient was seen to have hyperthyroidism contributing to tachycardia, likely, and started on low dose of methimazole.  Please also monitor blood glucose as her insulin regimen was changed as listed below to include increasing long acting to 50 units daily which she tolerated well inpatient without hypoglycemia. Follow up with neurology for complex stroke deficits  Discharge Condition:stable, improved CODE STATUS:  Code Status: Full Code  Regular healthy diet  Brief/Interim Summary: Pt presented from home to the ED on 01/19/2021 with speech difficulty and bilateral leg weakness starting the morning of admission. In the ED, it was found that they had no acute signs of stroke on head CT or MRI, however, there were multiple chronic microhemorrhages.  Neurology was consulted and signed off for outpatient follow up after placing recommendations to continue aspirin and ticagrelor. Patient endorsed having history of brain tumor which is being followed up outpatient by neurology. They were treated with risk-management for stroke prevention and therapeutic evaluations from PT/OT/SLP who recommended SNF at discharge.  Discharge Diagnoses:  Principal Problem:   Stroke [facility]) Active Problems:   Tachycardia   Diabetes mellitus without complication (HCC)   Thyroid disease   Essential hypertension   CAD (coronary artery disease)   Pheochromocytoma   Depression   HLD (hyperlipidemia)   Nausea & vomiting   CVA (cerebral vascular accident) ([place])   Aphasia   Spell of abnormal  behavior   Weakness   Type 2 diabetes mellitus with hyperlipidemia ([place])   Constipation   Discharge Instructions     Ambulatory referral to Neurology   Complete by: As directed    An appointment is requested in approximately: 4-6 weeks      Allergies as of 02/01/2021       Reactions   Contrast Media [iodinated Contrast Media] Swelling   Penicillins    Plavix [clopidogrel]    Sumatriptan         Medication List     STOP taking these medications    cetirizine 10 MG tablet Commonly known as: ZYRTEC Replaced by: loratadine 10 MG tablet   diphenhydrAMINE 25 MG tablet Commonly known as: BENADRYL   docusate sodium 100 MG capsule Commonly known as: COLACE   ergocalciferol 1.25 MG (50000 UT) capsule Commonly known as: VITAMIN D2   omeprazole 20 MG capsule Commonly known as: PRILOSEC   traMADol 50 MG tablet Commonly known as: ULTRAM       TAKE these medications    amLODipine 5 MG tablet Commonly known as: NORVASC Take 1 tablet (5 mg total) by mouth daily. Start taking on: February 02, 2021 What changed:  medication strength how much to take   aspirin 81 MG chewable tablet Chew 1 tablet (81 mg total) by mouth daily. Start taking on: February 02, 2021   atorvastatin 80 MG tablet Commonly known as: LIPITOR Take 80 mg by mouth daily.   cyclobenzaprine 5 MG tablet Commonly known as: FLEXERIL Take 5 mg by mouth 3 (three) times daily.   escitalopram 20 MG tablet Commonly known as: LEXAPRO Take 20 mg by mouth daily.   gabapentin 100 MG capsule Commonly known  as: NEURONTIN Take 200 mg by mouth 3 (three) times daily.   insulin detemir 100 unit/ml Soln Commonly known as: LEVEMIR Inject 50 Units into the skin daily. What changed:  how much to take when to take this   lisinopril 20 MG tablet Commonly known as: ZESTRIL Take 1 tablet (20 mg total) by mouth daily. Start taking on: February 02, 2021 What changed:  medication strength how much to  take   loratadine 10 MG tablet Commonly known as: CLARITIN Take 1 tablet (10 mg total) by mouth daily. Start taking on: February 02, 2021 Replaces: cetirizine 10 MG tablet   LORazepam 0.5 MG tablet Commonly known as: ATIVAN Take 0.25 mg by mouth at bedtime.   melatonin 3 MG Tabs tablet Take 9 mg by mouth at bedtime.   methimazole 5 MG tablet Commonly known as: TAPAZOLE Take 1 tablet (5 mg total) by mouth daily. Start taking on: February 02, 2021   metoCLOPramide 10 MG tablet Commonly known as: REGLAN Take 1 tablet (10 mg total) by mouth 3 (three) times daily before meals.   metoprolol tartrate 100 MG tablet Commonly known as: LOPRESSOR Take 1 tablet (100 mg total) by mouth 2 (two) times daily. What changed:  medication strength how much to take   NovoLOG FlexPen 100 UNIT/ML FlexPen Generic drug: insulin aspart Inject 0-10 Units into the skin 4 (four) times daily -  before meals and at bedtime.   polyethylene glycol 17 g packet Commonly known as: MIRALAX / GLYCOLAX Take 17 g by mouth daily.   ticagrelor 90 MG Tabs tablet Commonly known as: BRILINTA Take 90 mg by mouth 2 (two) times daily.   zolpidem 5 MG tablet Commonly known as: AMBIEN Take 5 mg by mouth at bedtime as needed for sleep.        Contact information for after-discharge care     Destination     [facility] .   Service: Skilled Nursing Contact information: [address] [address] [phone]                    Allergies  Allergen Reactions   Contrast Media [Iodinated Contrast Media] Swelling   Penicillins    Plavix [Clopidogrel]    Sumatriptan     Consultations: Neurology   Procedures/Studies: CT ABDOMEN PELVIS WO CONTRAST  Result Date: 01/19/2021 CLINICAL DATA:  Nausea, vomiting, abdominal pain. EXAM: CT ABDOMEN AND PELVIS WITHOUT CONTRAST TECHNIQUE: Multidetector CT imaging of the abdomen and pelvis was performed  following the standard protocol without IV contrast. COMPARISON:  None. FINDINGS: Lower chest: Small sliding-type hiatal hernia. Visualized lung bases are unremarkable. Hepatobiliary: No focal liver abnormality is seen. No gallstones, gallbladder wall thickening, or biliary dilatation. Pancreas: Unremarkable. No pancreatic ductal dilatation or surrounding inflammatory changes. Spleen: Normal in size without focal abnormality. Adrenals/Urinary Tract: Adrenal glands are unremarkable. Kidneys are normal, without renal calculi, focal lesion, or hydronephrosis. Bladder is unremarkable. Stomach/Bowel: Stomach is within normal limits. Appendix appears normal. No evidence of bowel wall thickening, distention, or inflammatory changes. Vascular/Lymphatic: Aortic atherosclerosis. No enlarged abdominal or pelvic lymph nodes. Reproductive: Patient appears to be status post hysterectomy. No definite adnexal abnormality is noted. 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 Other: No abdominal wall hernia or abnormality. No abdominopelvic ascites. Musculoskeletal: No acute or significant osseous findings. IMPRESSION: Patient appears to be status post hysterectomy. There appears to be contrast within the vaginal canal concerning  for vesicovaginal fistula. Small sliding-type hiatal hernia. Aortic Atherosclerosis (ICD10-I70.0). Electronically Signed   By: Marijo Conception M.D.   On: 01/19/2021 13:21   CT HEAD WO CONTRAST (5MM)  Result Date: 01/24/2021 CLINICAL DATA:  Stroke, follow up nausea vomiting EXAM: CT HEAD WITHOUT CONTRAST TECHNIQUE: Contiguous axial images were obtained from the base of the skull through the vertex without intravenous contrast. COMPARISON:  MRI head 01/20/2021 FINDINGS: Brain: Redemonstration of chronic bilateral basal ganglia, left thalami, and bilateral cerebellar infarctions. No evidence of large-territorial acute infarction. No parenchymal  hemorrhage. No mass lesion. No extra-axial collection. No mass effect or midline shift. No hydrocephalus. Basilar cisterns are patent. Vascular: No hyperdense vessel. Atherosclerotic calcifications are present within the cavernous internal carotid and vertebral arteries. Skull: No acute fracture or focal lesion. Sinuses/Orbits: Paranasal sinuses and mastoid air cells are clear. The orbits are unremarkable. Other: None. IMPRESSION: No acute intracranial abnormality in a patient with multiple prior infarctions. Electronically Signed   By: Iven Finn M.D.   On: 01/24/2021 16:20   CT SOFT TISSUE NECK WO CONTRAST  Result Date: 01/21/2021 CLINICAL DATA:  Soft tissue swelling, infection suspected EXAM: CT NECK WITHOUT CONTRAST TECHNIQUE: Multidetector CT imaging of the neck was performed following the standard protocol without intravenous contrast. COMPARISON:  CTA head neck 01/19/2021 FINDINGS: Pharynx and larynx: The nasal cavity and nasopharynx are normal. The oropharynx and oral cavity are normal. The hypopharynx and larynx are normal. There is no abnormal soft tissue mass or fluid collection, within the confines of noncontrast technique. The parapharyngeal spaces are clear. The airway is patent. Salivary glands: Parotid and submandibular glands are normal. Thyroid: Unremarkable. Lymph nodes: There is no pathologic lymphadenopathy in the neck. Vascular: There is calcified atherosclerotic plaque in the bilateral carotid bulbs. Limited intracranial: The imaged portions of the intracranial compartment are unremarkable. Visualized orbits: The globes and orbits are unremarkable. Mastoids and visualized paranasal sinuses: Paranasal sinuses and mastoid air cells are clear. Skeleton: There is no significant degenerative change of the cervical spine. There is no acute osseous abnormality or aggressive osseous lesion. There is no visible canal hematoma Upper chest: . the imaged lung apices are clear. Other: None.  IMPRESSION: Unremarkable noncontrast CT of the neck with no acute finding to explain the patient's symptoms. Electronically Signed   By: Valetta Mole M.D.   On: 01/21/2021 16:38   MR BRAIN WO CONTRAST  Result Date: 01/20/2021 CLINICAL DATA:  Left-sided weakness, stroke suspected EXAM: MRI HEAD WITHOUT CONTRAST TECHNIQUE: Multiplanar, multiecho pulse sequences of the brain and surrounding structures were obtained without intravenous contrast. COMPARISON:  CT/CTA head and neck dated 1 day prior FINDINGS: Brain: There is no evidence of acute intracranial hemorrhage, extra-axial fluid collection, or acute infarct. There is a background of moderate parenchymal volume loss, advanced for age. There are remote infarcts in the bilateral basal ganglia, thalami, right cerebral peduncle, and periventricular white matter. There is a remote infarct in the left cerebellar hemisphere. Additional confluent FLAIR signal abnormality in the subcortical and periventricular white matter likely reflects sequela of chronic white matter microangiopathy, also advanced for age. There are multiple small chronic microhemorrhages in a central distribution, likely hypertensive in etiology. There is no solid mass lesion.  There is no midline shift. Vascular: Normal flow voids. Skull and upper cervical spine: Normal marrow signal. Sinuses/Orbits: The paranasal sinuses are clear. The globes and orbits are unremarkable. Other: None. IMPRESSION: 1. No acute intracranial pathology. 2. Moderate parenchymal volume loss and chronic white matter  microangiopathy, advanced for age. 3. 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 4. Multiple small chronic microhemorrhages in a central distribution, likely hypertensive in etiology. Electronically Signed   By: Valetta Mole M.D.   On: 01/20/2021 14:31   MR THORACIC SPINE WO CONTRAST  Result Date: 01/21/2021 CLINICAL DATA:   Demyelinating disease EXAM: MRI THORACIC SPINE WITHOUT CONTRAST TECHNIQUE: Multiplanar, multisequence MR imaging of the thoracic spine was performed. No intravenous contrast was administered. COMPARISON:  None. FINDINGS: Evaluation is somewhat limited by motion artifact. Alignment:  Physiologic. Vertebrae: No fracture, evidence of discitis, or bone lesion. Cord:  Normal signal and morphology. Paraspinal and other soft tissues: Small right pleural effusion. Disc levels: T6-T7: Small central disc protrusion. No spinal canal stenosis or neural foraminal narrowing. T11-T12: Small left paracentral disc protrusion. No spinal canal stenosis or neural foraminal narrowing. Other thoracic vertebral levels are negative. IMPRESSION: 1. No evidence of demyelinating disease in the thoracic spinal cord. 2. No spinal canal stenosis or neural foraminal narrowing. Electronically Signed   By: Merilyn Baba M.D.   On: 01/21/2021 22:22   DG Chest Port 1 View  Result Date: 01/19/2021 CLINICAL DATA:  Sepsis. EXAM: PORTABLE CHEST 1 VIEW COMPARISON:  None. FINDINGS: The heart size and mediastinal contours are within normal limits. Both lungs are clear. Sternotomy wires are noted. The visualized skeletal structures are unremarkable. IMPRESSION: No active disease. Electronically Signed   By: Marijo Conception M.D.   On: 01/19/2021 09:18   DG Abd 2 Views  Result Date: 01/24/2021 CLINICAL DATA:  Nausea and vomiting EXAM: ABDOMEN - 2 VIEW COMPARISON:  CT abdomen pelvis 01/19/2021 FINDINGS: The bowel gas pattern is normal. There is no evidence of free air. PO contrast reaches the sigmoid colon. No extravasation of the PO contrast noted within the intraperitoneal space. Colonic diverticulosis. No radio-opaque calculi or other significant radiographic abnormality is seen. Vascular calcification. IMPRESSION: 1. PO contrast reaches the sigmoid colon. 2. Colonic diverticulosis. 3.  Aortic Atherosclerosis (ICD10-I70.0). Electronically Signed    By: Iven Finn M.D.   On: 01/24/2021 16:51   DG Swallowing Func-Speech Pathology  Result Date: 01/31/2021 Table formatting from the original result was not included. SLP Summary Report below; please see Notes for full report. Assessment / Plan / Recommendation   Clinical Impressions 01/31/2021 Clinical Impression Patient continues with moderate oropharyngeal dysphagia without changes in deficits of swallow timing, sensation, or airway protection as seen on MBS 01/23/21. Pt reports feeling of "throat swelling" has subsided, and lip swelling has reduced significantly. RN reports pt has been non-compliant with liquid recommendations; has been refusing pills unless given whole (vs crushed) in puree, and eating [place] ices despite education on liquid precautions. Pt reports onset of left-sided chest pain when coughing. Per review of vitals, has been afebrile. Hoarse vocal quality has improved, however pt with ongoing hypophonia as well as slight hypernasal quality to resonance. Along with impaired timing of swallow initiation and impaired laryngeal sensation, this may indicate CN X impairments. ENT consult may assist in further assessment of laryngeal function pertaining to both swallow and voicing. Oral stage of swallowing is characterized by adequate bolus hold but slow, disorganized lingual transport. Mastication with solids occurs with munching pattern (anterior) vs rotary chewing given absent dentition. Swallow initiation is delayed to the level of the pyriform sinuses with thin liquids. There is deep laryngeal penetration during the swallow with thin liquids, with silent aspiration post-swallow (one instance of delayed sensation).  Cued cough: patient was unable to follow the command consistently and was unable to clear the airway when she did follow this command. Airway protection is improved with nectar thick liquids (one instance of trace shallow penetration which cleared with throat clear, subsequent  swallow). Compensatory maneuvers attempted with thins included bolus hold, chin tuck, and supraglottic swallow, however these did not prevent aspiration with thin liquids. Given pt non-compliance with pills crushed, assessed with 13 mm barium tablet whole in puree. Pt able to clear this without difficulty. Does appear to have slightly improved base of tongue retraction, hyolaryngeal excursion, as there was minimal pharyngeal residue post-swallow (mild vallecular after puree, solid). Noted with slowed clearance through the cervical esophagus with intermittent retrograde flow with al consistencies, which remained below the level of the pharyngoesophageal segment. After the study pt was educated regarding findings and recommendation for continuing dysphagia 3, nectar liquids given acute illness, decreased functional reserve, and risk for aspiration. Patient was at times verbalized resistance, intent for non-compliance. Encouraged pt to follow recommendations at least as a temporary measure until medical status improves and she is able to begin working on rehabilitation. Educated provided pt that SLP simply makes recommendation, and that she may choose not to follow recommendations however this increases risk for aspiration, aspiration pneumonia, increased length of stay, and worsening respiratory status. Patient verbalized understanding and expressed desire to comply with recommendations. Nsg updated via secure chat.  SLP Visit Diagnosis Dysphagia, oropharyngeal phase (R13.12) Impact on safety and function Moderate aspiration risk;Risk for inadequate nutrition/hydration Deneise Lever, MS, CCC-SLP Speech-Language Pathologist    EEG adult  Result Date: 01/22/2021 Derek Jack, MD     01/22/2021  9:15 PM Routine EEG Report Lori [name] is a 50 y.o. female with a history of strokes and aphasia who is undergoing an EEG to evaluate for seizures. Report: This EEG was acquired with electrodes placed  according to the International 10-20 electrode system (including Fp1, Fp2, F3, F4, C3, C4, P3, P4, O1, O2, T3, T4, T5, T6, A1, A2, Fz, Cz, Pz). The following electrodes were missing or displaced: none. The occipital dominant rhythm was 7-8 Hz. This activity is reactive to stimulation. Drowsiness was manifested by background fragmentation; deeper stages of sleep were identified by K complexes and sleep spindles. There was subtle focal slowing over the left frontal region. There were no interictal epileptiform discharges. There were no electrographic seizures identified. There was no abnormal response to photic stimulation. Hyperventilation was not performed. Impression and clinical correlation: This EEG was obtained while awake and asleep and is abnormal due to mild diffuse slowing indicative of global cerebral dysfunction. Subtle left frontal focal slowing indicated superimposed focal dysfunction in that area. Epileptiform abnormalities were not seen during this recording. Su Monks, MD [facility] [phone] If 7pm- 7am, please page neurology on call as listed in [place].   ECHOCARDIOGRAM COMPLETE  Result Date: 01/21/2021    ECHOCARDIOGRAM REPORT   Patient Name:   Lori [name] Date of Exam: 01/21/2021 Medical Rec #:  [id number]           Height:       62.0 in Accession #:    [id number]          Weight:       215.4 lb Date of Birth:  03/04/1970            BSA:          1.973 m Patient Age:    40 years  BP:           124/89 mmHg Patient Gender: F                   HR:           107 bpm. Exam Location:  [facility] Procedure: 2D Echo Indications:     Stroke I63.9  History:         Patient has no prior history of Echocardiogram examinations.  Sonographer:     Kathlen Brunswick RDCS Referring Phys:  [id number] Soledad Gerlach NIU Diagnosing Phys: Ida Rogue MD  Sonographer Comments: Technically challenging study due to limited acoustic windows, suboptimal subcostal window and suboptimal apical window.  Image acquisition challenging due to patient body habitus. IMPRESSIONS  1. Left ventricular ejection fraction, by estimation, is 60 to 65%. The left ventricle has normal function. The left ventricle has no regional wall motion abnormalities. There is moderate left ventricular hypertrophy. Left ventricular diastolic parameters are consistent with Grade I diastolic dysfunction (impaired relaxation).  2. Right ventricular systolic function is normal. The right ventricular size is normal.  3. The mitral valve is normal in structure. No evidence of mitral valve regurgitation. No evidence of mitral stenosis.  4. Tricuspid valve regurgitation is mild to moderate.  5. The aortic valve is normal in structure. Aortic valve regurgitation is not visualized. Aortic valve sclerosis is present, with no evidence of aortic valve stenosis.  6. The inferior vena cava is normal in size with greater than 50% respiratory variability, suggesting right atrial pressure of 3 mmHg. FINDINGS  Left Ventricle: Left ventricular ejection fraction, by estimation, is 60 to 65%. The left ventricle has normal function. The left ventricle has no regional wall motion abnormalities. The left ventricular internal cavity size was normal in size. There is  moderate left ventricular hypertrophy. Left ventricular diastolic parameters are consistent with Grade I diastolic dysfunction (impaired relaxation). Right Ventricle: The right ventricular size is normal. No increase in right ventricular wall thickness. Right ventricular systolic function is normal. Left Atrium: Left atrial size was normal in size. Right Atrium: Right atrial size was normal in size. Pericardium: There is no evidence of pericardial effusion. Mitral Valve: The mitral valve is normal in structure. No evidence of mitral valve regurgitation. No evidence of mitral valve stenosis. Tricuspid Valve: The tricuspid valve is normal in structure. Tricuspid valve regurgitation is mild to moderate. No  evidence of tricuspid stenosis. Aortic Valve: The aortic valve is normal in structure. Aortic valve regurgitation is not visualized. Aortic valve sclerosis is present, with no evidence of aortic valve stenosis. Aortic valve peak gradient measures 8.0 mmHg. Pulmonic Valve: The pulmonic valve was normal in structure. Pulmonic valve regurgitation is not visualized. No evidence of pulmonic stenosis. Aorta: The aortic root is normal in size and structure. Venous: The inferior vena cava is normal in size with greater than 50% respiratory variability, suggesting right atrial pressure of 3 mmHg. IAS/Shunts: No atrial level shunt detected by color flow Doppler.  LEFT VENTRICLE PLAX 2D LVIDd:         3.00 cm   Diastology LVIDs:         2.10 cm   LV e' medial:    6.20 cm/s LV PW:         1.50 cm   LV E/e' medial:  14.3 LV IVS:        1.30 cm   LV e' lateral:   5.98 cm/s LVOT diam:     1.80 cm  LV E/e' lateral: 14.8 LV SV:         42 LV SV Index:   22 LVOT Area:     2.54 cm  LEFT ATRIUM         Index LA diam:    3.10 cm 1.57 cm/m  AORTIC VALVE                 PULMONIC VALVE AV Area (Vmax): 1.53 cm     PV Vmax:       1.18 m/s AV Vmax:        141.00 cm/s  PV Peak grad:  5.6 mmHg AV Peak Grad:   8.0 mmHg LVOT Vmax:      84.90 cm/s LVOT Vmean:     60.700 cm/s LVOT VTI:       0.167 m  AORTA Ao Root diam: 2.50 cm Ao Asc diam:  2.30 cm MITRAL VALVE                TRICUSPID VALVE MV Area (PHT): 6.43 cm     TV Peak grad:   19.2 mmHg MV Decel Time: 118 msec     TV Vmax:        2.19 m/s MV E velocity: 88.70 cm/s MV A velocity: 105.00 cm/s  SHUNTS MV E/A ratio:  0.84         Systemic VTI:  0.17 m                             Systemic Diam: 1.80 cm Ida Rogue MD Electronically signed by Ida Rogue MD Signature Date/Time: 01/21/2021/7:03:01 PM    Final    CT HEAD CODE STROKE WO CONTRAST  Result Date: 01/19/2021 CLINICAL DATA:  Code stroke. Generalized weakness. Aphasia and left-sided deficits from prior stroke EXAM: CT HEAD  WITHOUT CONTRAST TECHNIQUE: Contiguous axial images were obtained from the base of the skull through the vertex without intravenous contrast. COMPARISON:  None. FINDINGS: Brain: Confluent low-density in the cerebral white matter with superimposed chronic lacunar infarcts at the deep gray nuclei and deep white matter tracks. 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 Premature brain atrophy. No acute hemorrhage, hydrocephalus, collection, or masslike finding. Brain atrophy especially affecting the cerebellum. Vascular: No hyperdense vessel. Premature atheromatous calcification. Skull: Normal. Negative for fracture or focal lesion. Sinuses/Orbits: No acute finding. Other: These results were called by telephone at the time of interpretation on 01/19/2021 at 7:59 am to provider Merlyn Lot , who verbally acknowledged these results. ASPECTS [facility] [facility] Early CT Score) No acute infarct. IMPRESSION: 1. No acute finding. 2. 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 Electronically Signed   By: Jorje Guild M.D.   On: 01/19/2021 08:01   CT ANGIO HEAD CODE STROKE  Result Date: 01/19/2021 CLINICAL DATA:  Generalized weakness with aphasia. Left-sided deficits. Vomiting. EXAM: CT ANGIOGRAPHY HEAD AND NECK TECHNIQUE: Multidetector CT imaging of the head and neck was performed using the standard protocol during bolus administration of intravenous contrast. Multiplanar CT image reconstructions and MIPs were obtained to evaluate the vascular anatomy. Carotid stenosis measurements (when applicable) are obtained utilizing NASCET criteria, using the distal internal carotid diameter as the denominator. CONTRAST:  28mL OMNIPAQUE IOHEXOL 350 MG/ML SOLN COMPARISON:  None. FINDINGS: CTA NECK FINDINGS Aortic arch: Atheromatous plaque. 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 No dissection or ulceration. Left carotid system: Age advanced atheromatous plaque along the common carotid and proximal ICA without flow limiting stenosis or ulceration. Vertebral arteries: Aberrant right subclavian artery. Significant for age atheromatous plaque at the bilateral proximal subclavian, but without flow limiting stenosis. Heavily diseased vertebral arteries with extensive calcific plaque and multifocal narrowing. The origins are difficult to assess due to small vessel size, calcified plaque, and body habitus. Both vertebral arteries are patent at the dura although there is subsequent occlusion on the left. Skeleton: No acute finding Other neck: No acute finding Upper chest: No acute finding Review of the MIP images confirms the above findings CTA HEAD FINDINGS Anterior circulation: Heavily diseased carotid siphon with calcified plaque and multifocal luminal stenosis. The degree of calcified plaque limits lumen measurement, expect flow limiting stenosis on both sides. 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 Extensive atheromatous plaque affecting the M1 segments and MCA branches. Moderate narrowing at the bilateral M1 segment. Negative for aneurysm. Posterior circulation: Heavily diseased vertebral arteries with calcified plaque. Severe right vertebral stenosis beyond the PICA. Left proximal V4 segment occlusion with potentially retrograde flow seen at the proximal left PICA. Narrow basilar diffusely with 2 segments of non enhancement. Fetal type bilateral PCA flow. Venous sinuses: No emergent finding Anatomic variants: As above Review of the MIP images confirms the above findings Critical Value/emergent results were called by telephone at the time of interpretation on 01/19/2021 at 8:42 am to provider ERIC [facility] , who verbally acknowledged these results. IMPRESSION: 1. Generalized and  severe atherosclerosis, especially for age. 2. Dominant findings in the posterior circulation where there are left vertebral and basilar occlusions which are age indeterminate. Severe right V4 segment stenosis. 3. No flow limiting stenosis in the cervical carotid circulation. 4. Probable flow limiting stenosis at the carotid siphons, especially on the right. Electronically Signed   By: Jorje Guild M.D.   On: 01/19/2021 08:50   CT ANGIO NECK CODE STROKE  Result Date: 01/19/2021 CLINICAL DATA:  Generalized weakness with aphasia. Left-sided deficits. Vomiting. EXAM: CT ANGIOGRAPHY HEAD AND NECK TECHNIQUE: Multidetector CT imaging of the head and neck was performed using the standard protocol during bolus administration of intravenous contrast. Multiplanar CT image reconstructions and MIPs were obtained to evaluate the vascular anatomy. Carotid stenosis measurements (when applicable) are obtained utilizing NASCET criteria, using the distal internal carotid diameter as the denominator. CONTRAST:  3mL OMNIPAQUE IOHEXOL 350 MG/ML SOLN COMPARISON:  None. FINDINGS: CTA NECK FINDINGS Aortic arch: Atheromatous plaque. 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 No dissection or ulceration. Left carotid system: Age advanced atheromatous plaque along the common carotid and proximal ICA without flow limiting stenosis or ulceration. Vertebral arteries: Aberrant right subclavian artery. Significant for age atheromatous plaque at the bilateral proximal subclavian, but without flow limiting stenosis. Heavily diseased vertebral arteries with extensive calcific plaque and multifocal narrowing. The origins are difficult to assess due to small vessel size, calcified plaque, and body habitus. Both vertebral arteries are patent at the dura  although there is subsequent occlusion on the left. Skeleton: No acute finding Other neck: No acute finding Upper chest: No acute finding Review of the MIP images confirms the above findings CTA HEAD FINDINGS Anterior circulation: Heavily diseased carotid  siphon with calcified plaque and multifocal luminal stenosis. The degree of calcified plaque limits lumen measurement, expect flow limiting stenosis on both sides. 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 Extensive atheromatous plaque affecting the M1 segments and MCA branches. Moderate narrowing at the bilateral M1 segment. Negative for aneurysm. Posterior circulation: Heavily diseased vertebral arteries with calcified plaque. Severe right vertebral stenosis beyond the PICA. Left proximal V4 segment occlusion with potentially retrograde flow seen at the proximal left PICA. Narrow basilar diffusely with 2 segments of non enhancement. Fetal type bilateral PCA flow. Venous sinuses: No emergent finding Anatomic variants: As above Review of the MIP images confirms the above findings Critical Value/emergent results were called by telephone at the time of interpretation on 01/19/2021 at 8:42 am to provider ERIC [facility] , who verbally acknowledged these results. IMPRESSION: 1. Generalized and severe atherosclerosis, especially for age. 2. Dominant findings in the posterior circulation where there are left vertebral and basilar occlusions which are age indeterminate. Severe right V4 segment stenosis. 3. No flow limiting stenosis in the cervical carotid circulation. 4. Probable flow limiting stenosis at the carotid siphons, especially on the right. Electronically Signed   By: Jorje Guild M.D.   On: 01/19/2021 08:50    Subjective: Patient feels improved today with continued weakness in bilateral legs and difficulty with speech.  Discharge Exam: Vitals:   02/01/21 0757 02/01/21 1231  BP: 116/78 109/68  Pulse: 96 95  Resp: 18 18  Temp:  98.2 F (36.8 C) (!) 97 F (36.1 C)  SpO2: 99% 100%    General: Pt is alert, awake, not in acute distress Cardiovascular: RRR, S1/S2 +, no rubs, no gallops Respiratory: CTA bilaterally, no wheezing, no rhonchi Abdominal: Soft, NT, ND, bowel sounds + Extremities: no edema, no cyanosis. Chronic contracture of left hand  Labs: Basic Metabolic Panel: Recent Labs  Lab 01/29/21 0525  NA 137  K 4.1  CL 105  CO2 27  GLUCOSE 119*  BUN 9  CREATININE 0.80  CALCIUM 8.2*   CBC: Recent Labs  Lab 01/29/21 0525  WBC 13.5*  HGB 11.8*  HCT 35.4*  MCV 79.4*  PLT 339    Microbiology No results found for this or any previous visit (from the past 240 hour(s)).  Time coordinating discharge: Over 30 minutes  Richarda Osmond, MD  [facility] 02/01/2021, 3:08 PM

## 2021-02-01 NOTE — Progress Notes (Signed)
PROGRESS NOTE  Lori [name]    DOB: [birth year], 50 y.o.  HDQ:[id number]  PCP: Pcp, No   Code Status: Full Code   DOA: 01/19/2021   LOS: 56  Brief Narrative of Current Hospitalization  Lori [name] is a 50 y.o. female with a PMH significant for HTN, HLD, type II DM, stroke with residual left-sided deficits, thyroid disease, depression/anxiety, CAD s/p CABG, history of pheochromocytoma s/p adrenal removal. They presented from home to the ED on 01/19/2021 with speech difficulty and bilateral leg weakness since the morning of admission. In the ED, it was found that they had no acute signs of stroke on CT or MRI, however, there are multiple chronic microhemorrhages.  Neurology was consulted.  Signed off.  Patient endorsed having history of brain tumor which is being followed up outpatient by neurology. They were treated with risk-management for stroke prevention and therapeutic evaluations from PT/OT/SLP.  Patient was admitted to medicine service for further workup and management of subacute strokes as outlined in detail below.  02/01/21 -stable, ready for discharge to SNF when bed available  Assessment & Plan  Principal Problem:   Stroke [facility]) Active Problems:   Tachycardia   Diabetes mellitus without complication ([place])   Thyroid disease   Essential hypertension   CAD (coronary artery disease)   Pheochromocytoma   Depression   HLD (hyperlipidemia)   Nausea & vomiting   CVA (cerebral vascular accident) ([place])   Aphasia   Spell of abnormal behavior   Weakness   Type 2 diabetes mellitus with hyperlipidemia ([place])   Constipation  Subacute strokes not seen on imaging but having residual dysphagia and weakness. -Neurology has signed off -Continue with aspirin, Brilinta, atorvastatin -Follow-up with neurology outpatient for stroke/history of brain tumor.  Dr. Quinn Axe is aware. -PT/OT/SLP  Sinus tachycardia- controlled in 80-100 on current therapy - continue metoprolol- can  likely titrate down with thyroid therapy.  Hyperthyroid- mildly low TSH and elevated T4 on admission. Not on home medications. Likely contributing to her tachycardia.  - needs thyroid US OP - initiating methimazole  HTN-  - continue home amlodipine. - discontinue all PRN BP medications as they are shown to not have any benefit on patient mortality and have increased negative outcomes while inpatient.   Constipation - miralax daily.  - discontinue all docusate containing therapies as they are shown to be counter-productive  - lactulose PRN  Type II DM- moderately well controlled while inpatient - changed long-acting to once daily dosing in am for better safety profile - continue sSSI  Anxiety/depression-chronic, stable -Continue home Lexapro  DVT prophylaxis: heparin injection 5,000 Units Start: 01/19/21 1400   Diet:  Diet Orders (From admission, onward)     Start     Ordered   01/31/21 1307  DIET DYS 3 Room service appropriate? Yes with Assist; Fluid consistency: Nectar Thick  Diet effective now       Comments: Please send only fruits and veggies on trays -- may need to talk w/ the Dietician re: her dietary needs. Pt has requested no animal products.  Pt can have Cut Pineapple, baked potato and baked sweet potato per Speech ok!  Wants FISH at lunch/dinner meals.  NO RICE.  NO ITALIAN ICE or JELLO.  Question Answer Comment  Room service appropriate? Yes with Assist   Fluid consistency: Nectar Thick      01/31/21 1307            Subjective 02/01/21    Pt reports no complaints  today. She is ready for SNF.  Expresses concern for her insurance difficulties and thinks someone may have stolen her identity.   Disposition Plan & Communication  Patient status: Inpatient  Admitted From: Home Disposition: Skilled nursing facility Anticipated discharge date: patient stable to be discharged to SNF once bed available  Family Communication: none  Consults, Procedures,  Significant Events  Consultants:  Neurology   Procedures/significant events:  None  Antimicrobials:  Anti-infectives (From admission, onward)    None       Objective   Vitals:   01/31/21 1137 01/31/21 1604 01/31/21 1951 02/01/21 0632  BP: 104/74 117/75 105/78 108/80  Pulse: 84 93 (!) 102 91  Resp:  17 16 16   Temp:  98.8 F (37.1 C) 98.6 F (37 C) 97.7 F (36.5 C)  TempSrc:   Oral Oral  SpO2: 98% 100% 100% 98%  Weight:      Height:        Intake/Output Summary (Last 24 hours) at 02/01/2021 0735 Last data filed at 01/31/2021 1720 Gross per 24 hour  Intake --  Output 600 ml  Net -600 ml    Filed Weights   01/19/21 0838 01/20/21 0049  Weight: 102.6 kg 97.7 kg    Patient BMI: Body mass index is 39.4 kg/m.   Physical Exam:  General: awake, alert, NAD HEENT: atraumatic, clear conjunctiva, anicteric sclera, MMM, hearing grossly normal Respiratory: normal respiratory effort. Cardiovascular: quick capillary refill  Nervous: A&O x3. dysarthria. Decreased ROM of left hand- fingers contracted Extremities: no edema, normal tone Skin: dry, intact, normal temperature, normal color. No rashes, lesions or ulcers on exposed skin Psychiatry: normal mood, congruent affect  Labs   I have personally reviewed following labs and imaging studies CBC    Component Value Date/Time   WBC 13.5 (H) 01/29/2021 0525   RBC 4.46 01/29/2021 0525   HGB 11.8 (L) 01/29/2021 0525   HCT 35.4 (L) 01/29/2021 0525   PLT 339 01/29/2021 0525   MCV 79.4 (L) 01/29/2021 0525   MCH 26.5 01/29/2021 0525   MCHC 33.3 01/29/2021 0525   RDW 15.1 01/29/2021 0525   LYMPHSABS 1.0 01/22/2021 0850   MONOABS 1.1 (H) 01/22/2021 0850   EOSABS 0.0 01/22/2021 0850   BASOSABS 0.0 01/22/2021 0850   BMP Latest Ref Rng & Units 01/29/2021 01/25/2021 01/22/2021  Glucose 70 - 99 mg/dL 119(H) 210(H) 395(H)  BUN 6 - 20 mg/dL 9 17 26(H)  Creatinine 0.44 - 1.00 mg/dL 0.80 0.75 0.89  Sodium 135 - 145 mmol/L 137 137  139  Potassium 3.5 - 5.1 mmol/L 4.1 3.9 3.9  Chloride 98 - 111 mmol/L 105 104 107  CO2 22 - 32 mmol/L 27 27 24   Calcium 8.9 - 10.3 mg/dL 8.2(L) 8.5(L) 8.7(L)   Imaging Studies  DG Swallowing Func-Speech Pathology  Result Date: 01/31/2021 Table formatting from the original result was not included. SLP Summary Report below; please see Notes for full report. Assessment / Plan / Recommendation   Clinical Impressions 01/31/2021 Clinical Impression Patient continues with moderate oropharyngeal dysphagia without changes in deficits of swallow timing, sensation, or airway protection as seen on MBS 01/23/21. Pt reports feeling of "throat swelling" has subsided, and lip swelling has reduced significantly. RN reports pt has been non-compliant with liquid recommendations; has been refusing pills unless given whole (vs crushed) in puree, and eating [place] ices despite education on liquid precautions. Pt reports onset of left-sided chest pain when coughing. Per review of vitals, has been afebrile. Hoarse vocal quality  has improved, however pt with ongoing hypophonia as well as slight hypernasal quality to resonance. Along with impaired timing of swallow initiation and impaired laryngeal sensation, this may indicate CN X impairments. ENT consult may assist in further assessment of laryngeal function pertaining to both swallow and voicing. Oral stage of swallowing is characterized by adequate bolus hold but slow, disorganized lingual transport. Mastication with solids occurs with munching pattern (anterior) vs rotary chewing given absent dentition. Swallow initiation is delayed to the level of the pyriform sinuses with thin liquids. There is deep laryngeal penetration during the swallow with thin liquids, with silent aspiration post-swallow (one instance of delayed sensation). Cued cough: patient was unable to follow the command consistently and was unable to clear the airway when she did follow this command. Airway  protection is improved with nectar thick liquids (one instance of trace shallow penetration which cleared with throat clear, subsequent swallow). Compensatory maneuvers attempted with thins included bolus hold, chin tuck, and supraglottic swallow, however these did not prevent aspiration with thin liquids. Given pt non-compliance with pills crushed, assessed with 13 mm barium tablet whole in puree. Pt able to clear this without difficulty. Does appear to have slightly improved base of tongue retraction, hyolaryngeal excursion, as there was minimal pharyngeal residue post-swallow (mild vallecular after puree, solid). Noted with slowed clearance through the cervical esophagus with intermittent retrograde flow with al consistencies, which remained below the level of the pharyngoesophageal segment. After the study pt was educated regarding findings and recommendation for continuing dysphagia 3, nectar liquids given acute illness, decreased functional reserve, and risk for aspiration. Patient was at times verbalized resistance, intent for non-compliance. Encouraged pt to follow recommendations at least as a temporary measure until medical status improves and she is able to begin working on rehabilitation. Educated provided pt that SLP simply makes recommendation, and that she may choose not to follow recommendations however this increases risk for aspiration, aspiration pneumonia, increased length of stay, and worsening respiratory status. Patient verbalized understanding and expressed desire to comply with recommendations. Nsg updated via secure chat.  SLP Visit Diagnosis Dysphagia, oropharyngeal phase (R13.12) Impact on safety and function Moderate aspiration risk;Risk for inadequate nutrition/hydration Deneise Lever, MS, CCC-SLP Speech-Language Pathologist     Medications   Scheduled Meds:   stroke: mapping our early stages of recovery book   Does not apply Once   amLODipine  5 mg Oral Daily   aspirin  81 mg  Oral Daily   atorvastatin  80 mg Oral Daily   chlorhexidine  15 mL Mouth Rinse BID   Chlorhexidine Gluconate Cloth  6 each Topical Daily   cyclobenzaprine  5 mg Oral TID   escitalopram  20 mg Oral Daily   feeding supplement (NEPRO CARB STEADY)  237 mL Oral TID BM   gabapentin  200 mg Oral TID   heparin  5,000 Units Subcutaneous Q8H   insulin aspart  0-9 Units Subcutaneous TID WC   insulin aspart  4 Units Subcutaneous TID WC   insulin glargine-yfgn  50 Units Subcutaneous Daily   lisinopril  20 mg Oral Daily   loratadine  10 mg Oral Daily   LORazepam  0.25 mg Oral QHS   magic mouthwash w/lidocaine  15 mL Oral QID   mouth rinse  15 mL Mouth Rinse q12n4p   melatonin  10 mg Oral QHS   methimazole  5 mg Oral Daily   metoCLOPramide  10 mg Oral TID AC   metoprolol tartrate  100 mg  Oral BID   multivitamin with minerals  1 tablet Oral Daily   pantoprazole  40 mg Oral BID   polyethylene glycol  17 g Oral Daily   sodium chloride flush  3 mL Intravenous Once   ticagrelor  90 mg Oral BID   No recently discontinued medications to reconcile  LOS: 13 days   Richarda Osmond, DO [facility] 02/01/2021, 7:35 AM   Available by Epic secure chat 7AM-7PM. If 7PM-7AM, please contact night-coverage Refer to [email or web address] to contact the [facility] Attending or Consulting provider for this pt

## 2021-02-01 NOTE — Progress Notes (Signed)
Physical Therapy Treatment Patient Details Name: Lori [name] MRN: [id number] DOB: [birth year] Today's Date: 02/01/2021   History of Present Illness Pt is a 50 y/o F admitted on 01/19/21 after presenting with c/c of aphasia, N&V. MD assessment includes: Suspected acute stroke even though MRI of the brain did not show any acute stroke, multiple chronic microhemorrhages, and weakness.  PMH: HTN, HLD, DM, stroke with L sided weakness, thyroid disease, depression with anxiety, CAD s/p CABG, pheochromocytoma (s/p R adrenal gland removal).    PT Comments    Pt received supine in bed, eager to participate stating she was going to "make her legs work" this session. Co-treat with OT for pt and therapist safety and to allow for advancement of mobility. Pt was able to stand (STS x2 reps) and perform stand pivot transfer to recliner this session with MOD A x2. Mild knee buckling in bilateral LE; blocking not required however therapists remained close. First standing bout maintained for ~1 minute for hygiene. Pt performed self-care tasks with OT as PT assisted with preparing her lunch tray. Pt is progressing however continues to require assist of 2 for safe standing mobility. Possibly attempt using a hemi walker as pt progresses to allow for pt to assist with stabilizing rather than relying on therapists. Would benefit from skilled PT to address above deficits and promote optimal return to PLOF.     Recommendations for follow up therapy are one component of a multi-disciplinary discharge planning process, led by the attending physician.  Recommendations may be updated based on patient status, additional functional criteria and insurance authorization.  Follow Up Recommendations  Skilled nursing-short term rehab (<3 hours/day)     Assistance Recommended at Discharge Frequent or constant Supervision/Assistance  Equipment Recommendations  None recommended by PT    Recommendations for Other Services        Precautions / Restrictions Precautions Precautions: Fall Precaution Comments: L hemi (UE>LE), L field cut Restrictions Weight Bearing Restrictions: No     Mobility  Bed Mobility Overal bed mobility: Needs Assistance Bed Mobility: Supine to Sit Rolling: Mod assist;+2 for physical assistance   Supine to sit: Mod assist     General bed mobility comments: MOD A to complete management of hips square to EOB and assist with trunk. Pt utilizing technique to mobilize LLE using RLE.    Transfers Overall transfer level: Needs assistance Equipment used: 2 person hand held assist Transfers: Sit to/from Stand;Bed to chair/wheelchair/BSC Sit to Stand: Mod assist;+2 physical assistance Stand pivot transfers: Mod assist;+2 physical assistance         General transfer comment: STS x2 reps from EOB with MOD A x2 for light lifting assistance and steadying. On second rep, pt performed stand pivot with small steps to recliner. Therapist remaining close for knee block due to mild buckling in bilateral knees, L>R.    Ambulation/Gait               General Gait Details: unable/ unsafe at this time   Stairs             Wheelchair Mobility    Modified Rankin (Stroke Patients Only)       Balance Overall balance assessment: Needs assistance Sitting-balance support: No upper extremity supported;Feet supported Sitting balance-Leahy Scale: Poor Sitting balance - Comments: MIN A for dynamic sitting balance tasks, 1 posterior LOB requiring MAX A to correct Postural control: Posterior lean Standing balance support: Bilateral upper extremity supported Standing balance-Leahy Scale: Poor Standing balance comment: HHA x2 with  MOD A to maintain static standing as well as stand pivot                            Cognition Arousal/Alertness: Awake/alert Behavior During Therapy: WFL for tasks assessed/performed Overall Cognitive Status: Within Functional Limits for tasks  assessed                                          Exercises      General Comments        Pertinent Vitals/Pain Pain Assessment: No/denies pain Pain Score: 0-No pain    Home Living                          Prior Function            PT Goals (current goals can now be found in the care plan section) Acute Rehab PT Goals Patient Stated Goal: go to rehab then home PT Goal Formulation: With patient Time For Goal Achievement: 02/03/21 Potential to Achieve Goals: Fair    Frequency    7X/week      PT Plan      Co-evaluation PT/OT/SLP Co-Evaluation/Treatment: Yes Reason for Co-Treatment: Complexity of the patient's impairments (multi-system involvement);For patient/therapist safety;To address functional/ADL transfers PT goals addressed during session: Mobility/safety with mobility OT goals addressed during session: ADL's and self-care      AM-PAC PT "6 Clicks" Mobility   Outcome Measure  Help needed turning from your back to your side while in a flat bed without using bedrails?: A Lot Help needed moving from lying on your back to sitting on the side of a flat bed without using bedrails?: A Lot Help needed moving to and from a bed to a chair (including a wheelchair)?: A Lot Help needed standing up from a chair using your arms (e.g., wheelchair or bedside chair)?: A Lot Help needed to walk in hospital room?: Total Help needed climbing 3-5 steps with a railing? : Total 6 Click Score: 10    End of Session Equipment Utilized During Treatment: Gait belt Activity Tolerance: Patient tolerated treatment well Patient left: in chair;with call bell/phone within reach;with chair alarm set Nurse Communication: Mobility status PT Visit Diagnosis: Unsteadiness on feet (R26.81);Muscle weakness (generalized) (M62.81);Hemiplegia and hemiparesis;Other abnormalities of gait and mobility (R26.89) Hemiplegia - Right/Left: Left Hemiplegia -  dominant/non-dominant: Non-dominant Hemiplegia - caused by: Cerebral infarction     Time: 4854-6270 PT Time Calculation (min) (ACUTE ONLY): 25 min  Charges:  $Therapeutic Activity: 8-22 mins                     Patrina Levering PT, DPT 02/01/21 1:44 PM [phone]

## 2021-02-01 NOTE — NC FL2 (Signed)
[place] LEVEL OF CARE SCREENING TOOL     IDENTIFICATION  Patient Name: Lori [name] Birthdate: 09-May-1970 Sex: female Admission Date (Current Location): 01/19/2021  [place] and [place] Number:  Engineering geologist and Address:  [facility], [address], [place], [place] [address]      Provider Number: [id number]  Attending Physician Name and Address:  Richarda Osmond, MD  Relative Name and Phone Number:       Current Level of Care: Hospital Recommended Level of Care: [place] Prior Approval Number:    Date Approved/Denied:   PASRR Number: [id number]  Discharge Plan: SNF    Current Diagnoses: Patient Active Problem List   Diagnosis Date Noted   Constipation    Weakness    Type 2 diabetes mellitus with hyperlipidemia ([place])    Aphasia    Spell of abnormal behavior    Stroke ([place]) 01/19/2021   Tachycardia 01/19/2021   CAD (coronary artery disease) 01/19/2021   CVA (cerebral vascular accident) ([place]) 01/19/2021   Diabetes mellitus without complication ([place])    Thyroid disease    Essential hypertension    Pheochromocytoma    Depression    HLD (hyperlipidemia)    Nausea & vomiting     Orientation RESPIRATION BLADDER Height & Weight     Self, Time, Situation, Place  Normal Incontinent, External catheter Weight: 215 lb 6.2 oz (97.7 kg) Height:  5\' 2"  (157.5 cm)  BEHAVIORAL SYMPTOMS/MOOD NEUROLOGICAL BOWEL NUTRITION STATUS   (None)  (Stroke) Incontinent Diet (DYS 1. Fluid nectar thick. NO MEATS OR DAIRY,  Eats Friuts and vegatables. Gravy with potatoes. May have Oatmeal per Speech w/ butter/sugar. Yogurt, pudding.)  AMBULATORY STATUS COMMUNICATION OF NEEDS Skin   Extensive Assist Verbally Bruising                       Personal Care Assistance Level of Assistance  Bathing, Feeding, Dressing Bathing Assistance: Maximum assistance Feeding assistance: Limited assistance Dressing  Assistance: Maximum assistance     Functional Limitations Info  Sight, Hearing, Speech Sight Info: Adequate Hearing Info: Adequate Speech Info: Adequate (Slurred/dysarthria)    SPECIAL CARE FACTORS FREQUENCY  PT (By licensed PT), OT (By licensed OT), Speech therapy     PT Frequency: 5 x week OT Frequency: 5 x week     Speech Therapy Frequency: 5 x week      Contractures Contractures Info: Not present    Additional Factors Info  Code Status, Allergies, Psychotropic Code Status Info: Full code Allergies Info: Contrast Media (Iodinated Diagnostic Agents), Penicillins, Plavix (Clopidrogrel), Sumatriptan Psychotropic Info: Depression         Current Medications (02/01/2021):  This is the current hospital active medication list Current Facility-Administered Medications  Medication Dose Route Frequency Provider Last Rate Last Admin    stroke: mapping our early stages of recovery book   Does not apply Once Ivor Costa, MD       acetaminophen (TYLENOL) tablet 650 mg  650 mg Oral Q4H PRN Ivor Costa, MD   650 mg at 01/28/21 1051   Or   acetaminophen (TYLENOL) 160 MG/5ML solution 650 mg  650 mg Per Tube Q4H PRN Ivor Costa, MD   650 mg at 01/24/21 7322   Or   acetaminophen (TYLENOL) suppository 650 mg  650 mg Rectal Q4H PRN Ivor Costa, MD       amLODipine (NORVASC) tablet 5 mg  5 mg Oral Daily Loletha Grayer, MD  5 mg at 02/01/21 6294   antiseptic oral rinse (BIOTENE) solution 15 mL  15 mL Mouth Rinse PRN Loletha Grayer, MD       aspirin chewable tablet 81 mg  81 mg Oral Daily Dorothe Pea, RPH   81 mg at 02/01/21 7654   atorvastatin (LIPITOR) tablet 80 mg  80 mg Oral Daily Ivor Costa, MD   80 mg at 02/01/21 6503   bismuth subsalicylate (PEPTO BISMOL) 262 MG/15ML suspension 30 mL  30 mL Oral Q4H PRN Sharion Settler, NP   30 mL at 01/23/21 2204   chlorhexidine (PERIDEX) 0.12 % solution 15 mL  15 mL Mouth Rinse BID Ivor Costa, MD   15 mL at 02/01/21 5465   Chlorhexidine  Gluconate Cloth 2 % PADS 6 each  6 each Topical Daily Ivor Costa, MD   6 each at 02/01/21 6812   cyclobenzaprine (FLEXERIL) tablet 5 mg  5 mg Oral TID Ivor Costa, MD   5 mg at 02/01/21 7517   escitalopram (LEXAPRO) tablet 20 mg  20 mg Oral Daily Ivor Costa, MD   20 mg at 02/01/21 0017   feeding supplement (NEPRO CARB STEADY) liquid 237 mL  237 mL Oral TID BM Loletha Grayer, MD   237 mL at 01/30/21 0816   gabapentin (NEURONTIN) capsule 200 mg  200 mg Oral TID Ivor Costa, MD   200 mg at 02/01/21 4944   heparin injection 5,000 Units  5,000 Units Subcutaneous Cleophas Dunker, MD   5,000 Units at 02/01/21 9675   insulin aspart (novoLOG) injection 0-9 Units  0-9 Units Subcutaneous TID WC Ivor Costa, MD   1 Units at 02/01/21 1250   insulin aspart (novoLOG) injection 4 Units  4 Units Subcutaneous TID WC Loletha Grayer, MD   4 Units at 02/01/21 1250   insulin glargine-yfgn (SEMGLEE) injection 50 Units  50 Units Subcutaneous Daily Richarda Osmond, MD   50 Units at 02/01/21 0920   lactulose (CHRONULAC) 10 GM/15ML solution 30 g  30 g Oral BID PRN Loletha Grayer, MD       lisinopril (ZESTRIL) tablet 20 mg  20 mg Oral Daily Loletha Grayer, MD   20 mg at 02/01/21 9163   loratadine (CLARITIN) tablet 10 mg  10 mg Oral Daily Ivor Costa, MD   10 mg at 02/01/21 8466   LORazepam (ATIVAN) tablet 0.25 mg  0.25 mg Oral QHS Ivor Costa, MD   0.25 mg at 01/31/21 2045   magic mouthwash w/lidocaine  15 mL Oral QID Sharion Settler, NP   15 mL at 01/29/21 0930   MEDLINE mouth rinse  15 mL Mouth Rinse q12n4p Ivor Costa, MD   15 mL at 02/01/21 1250   melatonin tablet 10 mg  10 mg Oral QHS Ivor Costa, MD   10 mg at 01/31/21 2044   menthol-cetylpyridinium (CEPACOL) lozenge 3 mg  1 lozenge Oral PRN Rise Patience, MD   3 mg at 01/24/21 0630   methimazole (TAPAZOLE) tablet 5 mg  5 mg Oral Daily Richarda Osmond, MD   5 mg at 02/01/21 5993   metoCLOPramide (REGLAN) tablet 10 mg  10 mg Oral TID AC Wieting, Richard,  MD   10 mg at 02/01/21 1250   metoprolol tartrate (LOPRESSOR) tablet 100 mg  100 mg Oral BID Ralene Muskrat B, MD   100 mg at 02/01/21 5701   multivitamin with minerals tablet 1 tablet  1 tablet Oral Daily Loletha Grayer, MD   1 tablet at  02/01/21 0922   ondansetron (ZOFRAN) injection 4 mg  4 mg Intravenous Q6H PRN Ralene Muskrat B, MD   4 mg at 01/24/21 0938   pantoprazole (PROTONIX) EC tablet 40 mg  40 mg Oral BID Loletha Grayer, MD   40 mg at 02/01/21 3338   polyethylene glycol (MIRALAX / GLYCOLAX) packet 17 g  17 g Oral Daily Loletha Grayer, MD   17 g at 01/31/21 3291   sodium chloride flush (NS) 0.9 % injection 3 mL  3 mL Intravenous Once Ivor Costa, MD       ticagrelor [facility]) tablet 90 mg  90 mg Oral BID Ivor Costa, MD   90 mg at 02/01/21 9166   white petrolatum (VASELINE) gel   Topical PRN Ralene Muskrat B, MD       zolpidem (AMBIEN) tablet 5 mg  5 mg Oral QHS PRN Ivor Costa, MD   5 mg at 01/31/21 2045     Discharge Medications: Please see discharge summary for a list of discharge medications.  Relevant Imaging Results:  Relevant Lab Results:   Additional Information SS#: [id number].  Candie Chroman, LCSW

## 2021-02-01 NOTE — Progress Notes (Signed)
EMS unable to transport patient to facility today. Discharge cancelled for today.

## 2021-02-01 NOTE — TOC Progression Note (Addendum)
Transition of Care [facility]) - Progression Note    Patient Details  Name: Shine Scrogham MRN: [id number] Date of Birth: 04/07/1970  Transition of Care [facility]) CM/SW [place], LCSW Phone Number: 02/01/2021, 10:03 AM  Clinical Narrative:   Insurance authorization still pending. SNF admissions coordinator said she has an [place]of[place] policy so it may take some time.  11:30 am: Updated patient. She said she has lived in [place] for 3 years and did not know why she her policy would still be out-of-state.  2:43 pm: Insurance authorization still pending. Patient and daughter aware.  3:05 pm: Insurance authorization is approved. SNF can accept her today. Rapid COVID pending.  5:21 pm: EMS dispatch is trying to get in touch with crew chief to see if they can transport to [facility] but he is responding to a 911 call right now. Called [facility] but they have 20 people on their list.  Expected Discharge Plan: [place] (vs SNF) Barriers to Discharge: Continued Medical Work up  Expected Discharge Plan and Services Expected Discharge Plan: [place] (vs SNF)     Post Acute Care Choice: IP Rehab Living arrangements for the past 2 months: Apartment                                       Social Determinants of Health (SDOH) Interventions    Readmission Risk Interventions No flowsheet data found.

## 2021-02-02 LAB — GLUCOSE, CAPILLARY: Glucose-Capillary: 167 mg/dL — ABNORMAL HIGH (ref 70–99)

## 2021-02-02 NOTE — Discharge Summary (Signed)
Physician Discharge Summary  Lori [name] [place] ERD:[id number] DOB: November 26, 1970 DOA: 01/19/2021  PCP: Merryl Hacker, No  Admit date: 01/19/2021 Discharge date: 02/02/2021  Admitted From: Home Disposition: Skilled nursing facility  Recommendations for Outpatient Follow-up:  Follow up with PCP within 1-2 weeks to monitor blood pressure as medications have been titrated as listed below Follow up on thyroid function and recommend thyroid US as patient was seen to have hyperthyroidism contributing to tachycardia, likely, and started on low dose of methimazole.  Please also monitor blood glucose as her insulin regimen was changed as listed below to include increasing long acting to 50 units daily which she tolerated well inpatient without hypoglycemia. Follow up with neurology for complex stroke deficits  Discharge Condition:stable, improved CODE STATUS:  Code Status: Full Code  Regular healthy diet  Brief/Interim Summary: Pt presented from home to the ED on 01/19/2021 with speech difficulty and bilateral leg weakness starting the morning of admission. In the ED, it was found that they had no acute signs of stroke on head CT or MRI, however, there were multiple chronic microhemorrhages.  Neurology was consulted and signed off for outpatient follow up after placing recommendations to continue aspirin and ticagrelor. Patient endorsed having history of brain tumor which is being followed up outpatient by neurology. They were treated with risk-management for stroke prevention and therapeutic evaluations from PT/OT/SLP who recommended SNF at discharge.  Patient was discharged 12/29 but due to transportation delays will be discharged to SNF today (12/30), there were no significant changes overnight and patient remains stable and agreeable to plan.   Discharge Diagnoses:  Principal Problem:   Stroke [facility]) Active Problems:   Tachycardia   Diabetes mellitus without complication (HCC)   Thyroid disease    Essential hypertension   CAD (coronary artery disease)   Pheochromocytoma   Depression   HLD (hyperlipidemia)   Nausea & vomiting   CVA (cerebral vascular accident) ([place])   Aphasia   Spell of abnormal behavior   Weakness   Type 2 diabetes mellitus with hyperlipidemia ([place])   Constipation   Discharge Instructions     Ambulatory referral to Neurology   Complete by: As directed    An appointment is requested in approximately: 4-6 weeks      Allergies as of 02/02/2021       Reactions   Contrast Media [iodinated Contrast Media] Swelling   Penicillins    Plavix [clopidogrel]    Sumatriptan         Medication List     STOP taking these medications    cetirizine 10 MG tablet Commonly known as: ZYRTEC Replaced by: loratadine 10 MG tablet   diphenhydrAMINE 25 MG tablet Commonly known as: BENADRYL   docusate sodium 100 MG capsule Commonly known as: COLACE   ergocalciferol 1.25 MG (50000 UT) capsule Commonly known as: VITAMIN D2   omeprazole 20 MG capsule Commonly known as: PRILOSEC   traMADol 50 MG tablet Commonly known as: ULTRAM       TAKE these medications    amLODipine 5 MG tablet Commonly known as: NORVASC Take 1 tablet (5 mg total) by mouth daily. What changed:  medication strength how much to take   aspirin 81 MG chewable tablet Chew 1 tablet (81 mg total) by mouth daily.   atorvastatin 80 MG tablet Commonly known as: LIPITOR Take 80 mg by mouth daily.   cyclobenzaprine 5 MG tablet Commonly known as: FLEXERIL Take 5 mg by mouth 3 (three) times daily.   escitalopram 20  MG tablet Commonly known as: LEXAPRO Take 20 mg by mouth daily.   gabapentin 100 MG capsule Commonly known as: NEURONTIN Take 200 mg by mouth 3 (three) times daily.   insulin detemir 100 unit/ml Soln Commonly known as: LEVEMIR Inject 50 Units into the skin daily. What changed:  how much to take when to take this   lisinopril 20 MG tablet Commonly known as:  ZESTRIL Take 1 tablet (20 mg total) by mouth daily. What changed:  medication strength how much to take   loratadine 10 MG tablet Commonly known as: CLARITIN Take 1 tablet (10 mg total) by mouth daily. Replaces: cetirizine 10 MG tablet   LORazepam 0.5 MG tablet Commonly known as: ATIVAN Take 0.5 tablets (0.25 mg total) by mouth at bedtime.   melatonin 3 MG Tabs tablet Take 9 mg by mouth at bedtime.   methimazole 5 MG tablet Commonly known as: TAPAZOLE Take 1 tablet (5 mg total) by mouth daily.   metoCLOPramide 10 MG tablet Commonly known as: REGLAN Take 1 tablet (10 mg total) by mouth 3 (three) times daily before meals.   metoprolol tartrate 100 MG tablet Commonly known as: LOPRESSOR Take 1 tablet (100 mg total) by mouth 2 (two) times daily. What changed:  medication strength how much to take   NovoLOG FlexPen 100 UNIT/ML FlexPen Generic drug: insulin aspart Inject 0-10 Units into the skin 4 (four) times daily -  before meals and at bedtime.   polyethylene glycol 17 g packet Commonly known as: MIRALAX / GLYCOLAX Take 17 g by mouth daily.   ticagrelor 90 MG Tabs tablet Commonly known as: BRILINTA Take 90 mg by mouth 2 (two) times daily.   zolpidem 5 MG tablet Commonly known as: AMBIEN Take 1 tablet (5 mg total) by mouth at bedtime as needed for sleep.        Contact information for after-discharge care     Destination     [facility] .   Service: Skilled Nursing Contact information: [address] [place] [address] [phone]                    Allergies  Allergen Reactions   Contrast Media [Iodinated Contrast Media] Swelling   Penicillins    Plavix [Clopidogrel]    Sumatriptan     Consultations: Neurology   Procedures/Studies: CT ABDOMEN PELVIS WO CONTRAST  Result Date: 01/19/2021 CLINICAL DATA:  Nausea, vomiting, abdominal pain. EXAM: CT ABDOMEN AND PELVIS WITHOUT CONTRAST TECHNIQUE:  Multidetector CT imaging of the abdomen and pelvis was performed following the standard protocol without IV contrast. COMPARISON:  None. FINDINGS: Lower chest: Small sliding-type hiatal hernia. Visualized lung bases are unremarkable. Hepatobiliary: No focal liver abnormality is seen. No gallstones, gallbladder wall thickening, or biliary dilatation. Pancreas: Unremarkable. No pancreatic ductal dilatation or surrounding inflammatory changes. Spleen: Normal in size without focal abnormality. Adrenals/Urinary Tract: Adrenal glands are unremarkable. Kidneys are normal, without renal calculi, focal lesion, or hydronephrosis. Bladder is unremarkable. Stomach/Bowel: Stomach is within normal limits. Appendix appears normal. No evidence of bowel wall thickening, distention, or inflammatory changes. Vascular/Lymphatic: Aortic atherosclerosis. No enlarged abdominal or pelvic lymph nodes. Reproductive: Patient appears to be status post hysterectomy. No definite adnexal abnormality is noted. 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 Other: No abdominal wall hernia or abnormality. No abdominopelvic ascites. Musculoskeletal: No acute or significant osseous findings. IMPRESSION: Patient appears to be status post hysterectomy. 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 Small sliding-type hiatal hernia. Aortic Atherosclerosis (ICD10-I70.0). Electronically Signed   By: Marijo Conception M.D.   On: 01/19/2021 13:21   CT HEAD WO CONTRAST (5MM)  Result Date: 01/24/2021 CLINICAL DATA:  Stroke, follow up nausea vomiting EXAM: CT HEAD WITHOUT CONTRAST TECHNIQUE: Contiguous axial images were obtained from the base of the skull through the vertex without intravenous contrast. COMPARISON:  MRI head 01/20/2021 FINDINGS: Brain: Redemonstration of chronic bilateral basal ganglia, left thalami, and bilateral cerebellar infarctions. No  evidence of large-territorial acute infarction. No parenchymal hemorrhage. No mass lesion. No extra-axial collection. No mass effect or midline shift. No hydrocephalus. Basilar cisterns are patent. Vascular: No hyperdense vessel. Atherosclerotic calcifications are present within the cavernous internal carotid and vertebral arteries. Skull: No acute fracture or focal lesion. Sinuses/Orbits: Paranasal sinuses and mastoid air cells are clear. The orbits are unremarkable. Other: None. IMPRESSION: No acute intracranial abnormality in a patient with multiple prior infarctions. Electronically Signed   By: Iven Finn M.D.   On: 01/24/2021 16:20   CT SOFT TISSUE NECK WO CONTRAST  Result Date: 01/21/2021 CLINICAL DATA:  Soft tissue swelling, infection suspected EXAM: CT NECK WITHOUT CONTRAST TECHNIQUE: Multidetector CT imaging of the neck was performed following the standard protocol without intravenous contrast. COMPARISON:  CTA head neck 01/19/2021 FINDINGS: Pharynx and larynx: The nasal cavity and nasopharynx are normal. The oropharynx and oral cavity are normal. The hypopharynx and larynx are normal. There is no abnormal soft tissue mass or fluid collection, within the confines of noncontrast technique. The parapharyngeal spaces are clear. The airway is patent. Salivary glands: Parotid and submandibular glands are normal. Thyroid: Unremarkable. Lymph nodes: There is no pathologic lymphadenopathy in the neck. Vascular: There is calcified atherosclerotic plaque in the bilateral carotid bulbs. Limited intracranial: The imaged portions of the intracranial compartment are unremarkable. Visualized orbits: The globes and orbits are unremarkable. Mastoids and visualized paranasal sinuses: Paranasal sinuses and mastoid air cells are clear. Skeleton: There is no significant degenerative change of the cervical spine. There is no acute osseous abnormality or aggressive osseous lesion. There is no visible canal hematoma  Upper chest: . the imaged lung apices are clear. Other: None. IMPRESSION: Unremarkable noncontrast CT of the neck with no acute finding to explain the patient's symptoms. Electronically Signed   By: Valetta Mole M.D.   On: 01/21/2021 16:38   MR BRAIN WO CONTRAST  Result Date: 01/20/2021 CLINICAL DATA:  Left-sided weakness, stroke suspected EXAM: MRI HEAD WITHOUT CONTRAST TECHNIQUE: Multiplanar, multiecho pulse sequences of the brain and surrounding structures were obtained without intravenous contrast. COMPARISON:  CT/CTA head and neck dated 1 day prior FINDINGS: Brain: There is no evidence of acute intracranial hemorrhage, extra-axial fluid collection, or acute infarct. There is a background of moderate parenchymal volume loss, advanced for age. There are remote infarcts in the bilateral basal ganglia, thalami, right cerebral peduncle, and periventricular white matter. There is a remote infarct in the left cerebellar hemisphere. Additional confluent FLAIR signal abnormality in the subcortical and periventricular white matter likely reflects sequela of chronic white matter microangiopathy, also advanced for age. There are multiple small chronic microhemorrhages in a central distribution, likely hypertensive in etiology. There is no solid mass lesion.  There is no midline shift. Vascular: Normal flow voids. Skull and upper cervical spine: Normal marrow signal. Sinuses/Orbits: The paranasal sinuses are clear. The globes and orbits are unremarkable. Other: None. IMPRESSION: 1. No acute intracranial pathology. 2.  Moderate parenchymal volume loss and chronic white matter microangiopathy, advanced for age. 3. 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 4. Multiple small chronic microhemorrhages in a central distribution, likely hypertensive in etiology. Electronically Signed   By: Valetta Mole M.D.   On: 01/20/2021 14:31   MR THORACIC SPINE  WO CONTRAST  Result Date: 01/21/2021 CLINICAL DATA:  Demyelinating disease EXAM: MRI THORACIC SPINE WITHOUT CONTRAST TECHNIQUE: Multiplanar, multisequence MR imaging of the thoracic spine was performed. No intravenous contrast was administered. COMPARISON:  None. FINDINGS: Evaluation is somewhat limited by motion artifact. Alignment:  Physiologic. Vertebrae: No fracture, evidence of discitis, or bone lesion. Cord:  Normal signal and morphology. Paraspinal and other soft tissues: Small right pleural effusion. Disc levels: T6-T7: Small central disc protrusion. No spinal canal stenosis or neural foraminal narrowing. T11-T12: Small left paracentral disc protrusion. No spinal canal stenosis or neural foraminal narrowing. Other thoracic vertebral levels are negative. IMPRESSION: 1. No evidence of demyelinating disease in the thoracic spinal cord. 2. No spinal canal stenosis or neural foraminal narrowing. Electronically Signed   By: Merilyn Baba M.D.   On: 01/21/2021 22:22   DG Chest Port 1 View  Result Date: 01/19/2021 CLINICAL DATA:  Sepsis. EXAM: PORTABLE CHEST 1 VIEW COMPARISON:  None. FINDINGS: The heart size and mediastinal contours are within normal limits. Both lungs are clear. Sternotomy wires are noted. The visualized skeletal structures are unremarkable. IMPRESSION: No active disease. Electronically Signed   By: Marijo Conception M.D.   On: 01/19/2021 09:18   DG Abd 2 Views  Result Date: 01/24/2021 CLINICAL DATA:  Nausea and vomiting EXAM: ABDOMEN - 2 VIEW COMPARISON:  CT abdomen pelvis 01/19/2021 FINDINGS: The bowel gas pattern is normal. There is no evidence of free air. PO contrast reaches the sigmoid colon. No extravasation of the PO contrast noted within the intraperitoneal space. Colonic diverticulosis. No radio-opaque calculi or other significant radiographic abnormality is seen. Vascular calcification. IMPRESSION: 1. PO contrast reaches the sigmoid colon. 2. Colonic diverticulosis. 3.  Aortic  Atherosclerosis (ICD10-I70.0). Electronically Signed   By: Iven Finn M.D.   On: 01/24/2021 16:51   DG Swallowing Func-Speech Pathology  Result Date: 01/31/2021 Table formatting from the original result was not included. SLP Summary Report below; please see Notes for full report. Assessment / Plan / Recommendation   Clinical Impressions 01/31/2021 Clinical Impression Patient continues with moderate oropharyngeal dysphagia without changes in deficits of swallow timing, sensation, or airway protection as seen on MBS 01/23/21. Pt reports feeling of "throat swelling" has subsided, and lip swelling has reduced significantly. RN reports pt has been non-compliant with liquid recommendations; has been refusing pills unless given whole (vs crushed) in puree, and eating [place] ices despite education on liquid precautions. Pt reports onset of left-sided chest pain when coughing. Per review of vitals, has been afebrile. Hoarse vocal quality has improved, however pt with ongoing hypophonia as well as slight hypernasal quality to resonance. Along with impaired timing of swallow initiation and impaired laryngeal sensation, this may indicate CN X impairments. ENT consult may assist in further assessment of laryngeal function pertaining to both swallow and voicing. Oral stage of swallowing is characterized by adequate bolus hold but slow, disorganized lingual transport. Mastication with solids occurs with munching pattern (anterior) vs rotary chewing given absent dentition. Swallow initiation is delayed to the level of the pyriform sinuses with thin liquids. There is deep laryngeal penetration during the swallow with thin liquids, with  silent aspiration post-swallow (one instance of delayed sensation). Cued cough: patient was unable to follow the command consistently and was unable to clear the airway when she did follow this command. Airway protection is improved with nectar thick liquids (one instance of trace shallow  penetration which cleared with throat clear, subsequent swallow). Compensatory maneuvers attempted with thins included bolus hold, chin tuck, and supraglottic swallow, however these did not prevent aspiration with thin liquids. Given pt non-compliance with pills crushed, assessed with 13 mm barium tablet whole in puree. Pt able to clear this without difficulty. Does appear to have slightly improved base of tongue retraction, hyolaryngeal excursion, as there was minimal pharyngeal residue post-swallow (mild vallecular after puree, solid). Noted with slowed clearance through the cervical esophagus with intermittent retrograde flow with al consistencies, which remained below the level of the pharyngoesophageal segment. After the study pt was educated regarding findings and recommendation for continuing dysphagia 3, nectar liquids given acute illness, decreased functional reserve, and risk for aspiration. Patient was at times verbalized resistance, intent for non-compliance. Encouraged pt to follow recommendations at least as a temporary measure until medical status improves and she is able to begin working on rehabilitation. Educated provided pt that SLP simply makes recommendation, and that she may choose not to follow recommendations however this increases risk for aspiration, aspiration pneumonia, increased length of stay, and worsening respiratory status. Patient verbalized understanding and expressed desire to comply with recommendations. Nsg updated via secure chat.  SLP Visit Diagnosis Dysphagia, oropharyngeal phase (R13.12) Impact on safety and function Moderate aspiration risk;Risk for inadequate nutrition/hydration Deneise Lever, MS, CCC-SLP Speech-Language Pathologist    EEG adult  Result Date: 01/22/2021 Derek Jack, MD     01/22/2021  9:15 PM Routine EEG Report [place] Rommel [name] is a 50 y.o. female with a history of strokes and aphasia who is undergoing an EEG to evaluate for seizures.  Report: This EEG was acquired with electrodes placed according to the International 10-20 electrode system (including Fp1, Fp2, F3, F4, C3, C4, P3, P4, O1, O2, T3, T4, T5, T6, A1, A2, Fz, Cz, Pz). The following electrodes were missing or displaced: none. The occipital dominant rhythm was 7-8 Hz. This activity is reactive to stimulation. Drowsiness was manifested by background fragmentation; deeper stages of sleep were identified by K complexes and sleep spindles. There was subtle focal slowing over the left frontal region. There were no interictal epileptiform discharges. There were no electrographic seizures identified. There was no abnormal response to photic stimulation. Hyperventilation was not performed. Impression and clinical correlation: This EEG was obtained while awake and asleep and is abnormal due to mild diffuse slowing indicative of global cerebral dysfunction. Subtle left frontal focal slowing indicated superimposed focal dysfunction in that area. Epileptiform abnormalities were not seen during this recording. Su Monks, MD [facility] [phone] If 7pm- 7am, please page neurology on call as listed in [place].   ECHOCARDIOGRAM COMPLETE  Result Date: 01/21/2021    ECHOCARDIOGRAM REPORT   Patient Name:   ALVINE MOSTAFA Date of Exam: 01/21/2021 Medical Rec #:  [id number]           Height:       62.0 in Accession #:    [id number]          Weight:       215.4 lb Date of Birth:  November 18, 1970            BSA:          1.973 m  Patient Age:    50 years            BP:           124/89 mmHg Patient Gender: F                   HR:           107 bpm. Exam Location:  [facility] Procedure: 2D Echo Indications:     Stroke I63.9  History:         Patient has no prior history of Echocardiogram examinations.  Sonographer:     Kathlen Brunswick RDCS Referring Phys:  [id number] Soledad Gerlach NIU Diagnosing Phys: Ida Rogue MD  Sonographer Comments: Technically challenging study due to limited acoustic windows,  suboptimal subcostal window and suboptimal apical window. Image acquisition challenging due to patient body habitus. IMPRESSIONS  1. Left ventricular ejection fraction, by estimation, is 60 to 65%. The left ventricle has normal function. The left ventricle has no regional wall motion abnormalities. There is moderate left ventricular hypertrophy. Left ventricular diastolic parameters are consistent with Grade I diastolic dysfunction (impaired relaxation).  2. Right ventricular systolic function is normal. The right ventricular size is normal.  3. The mitral valve is normal in structure. No evidence of mitral valve regurgitation. No evidence of mitral stenosis.  4. Tricuspid valve regurgitation is mild to moderate.  5. The aortic valve is normal in structure. Aortic valve regurgitation is not visualized. Aortic valve sclerosis is present, with no evidence of aortic valve stenosis.  6. The inferior vena cava is normal in size with greater than 50% respiratory variability, suggesting right atrial pressure of 3 mmHg. FINDINGS  Left Ventricle: Left ventricular ejection fraction, by estimation, is 60 to 65%. The left ventricle has normal function. The left ventricle has no regional wall motion abnormalities. The left ventricular internal cavity size was normal in size. There is  moderate left ventricular hypertrophy. Left ventricular diastolic parameters are consistent with Grade I diastolic dysfunction (impaired relaxation). Right Ventricle: The right ventricular size is normal. No increase in right ventricular wall thickness. Right ventricular systolic function is normal. Left Atrium: Left atrial size was normal in size. Right Atrium: Right atrial size was normal in size. Pericardium: There is no evidence of pericardial effusion. Mitral Valve: The mitral valve is normal in structure. No evidence of mitral valve regurgitation. No evidence of mitral valve stenosis. Tricuspid Valve: The tricuspid valve is normal in  structure. Tricuspid valve regurgitation is mild to moderate. No evidence of tricuspid stenosis. Aortic Valve: The aortic valve is normal in structure. Aortic valve regurgitation is not visualized. Aortic valve sclerosis is present, with no evidence of aortic valve stenosis. Aortic valve peak gradient measures 8.0 mmHg. Pulmonic Valve: The pulmonic valve was normal in structure. Pulmonic valve regurgitation is not visualized. No evidence of pulmonic stenosis. Aorta: The aortic root is normal in size and structure. Venous: The inferior vena cava is normal in size with greater than 50% respiratory variability, suggesting right atrial pressure of 3 mmHg. IAS/Shunts: No atrial level shunt detected by color flow Doppler.  LEFT VENTRICLE PLAX 2D LVIDd:         3.00 cm   Diastology LVIDs:         2.10 cm   LV e' medial:    6.20 cm/s LV PW:         1.50 cm   LV E/e' medial:  14.3 LV IVS:        1.30 cm  LV e' lateral:   5.98 cm/s LVOT diam:     1.80 cm   LV E/e' lateral: 14.8 LV SV:         42 LV SV Index:   22 LVOT Area:     2.54 cm  LEFT ATRIUM         Index LA diam:    3.10 cm 1.57 cm/m  AORTIC VALVE                 PULMONIC VALVE AV Area (Vmax): 1.53 cm     PV Vmax:       1.18 m/s AV Vmax:        141.00 cm/s  PV Peak grad:  5.6 mmHg AV Peak Grad:   8.0 mmHg LVOT Vmax:      84.90 cm/s LVOT Vmean:     60.700 cm/s LVOT VTI:       0.167 m  AORTA Ao Root diam: 2.50 cm Ao Asc diam:  2.30 cm MITRAL VALVE                TRICUSPID VALVE MV Area (PHT): 6.43 cm     TV Peak grad:   19.2 mmHg MV Decel Time: 118 msec     TV Vmax:        2.19 m/s MV E velocity: 88.70 cm/s MV A velocity: 105.00 cm/s  SHUNTS MV E/A ratio:  0.84         Systemic VTI:  0.17 m                             Systemic Diam: 1.80 cm Ida Rogue MD Electronically signed by Ida Rogue MD Signature Date/Time: 01/21/2021/7:03:01 PM    Final    CT HEAD CODE STROKE WO CONTRAST  Result Date: 01/19/2021 CLINICAL DATA:  Code stroke. Generalized weakness.  Aphasia and left-sided deficits from prior stroke EXAM: CT HEAD WITHOUT CONTRAST TECHNIQUE: Contiguous axial images were obtained from the base of the skull through the vertex without intravenous contrast. COMPARISON:  None. FINDINGS: Brain: Confluent low-density in the cerebral white matter with superimposed chronic lacunar infarcts at the deep gray nuclei and deep white matter tracks. 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 Premature brain atrophy. No acute hemorrhage, hydrocephalus, collection, or masslike finding. Brain atrophy especially affecting the cerebellum. Vascular: No hyperdense vessel. Premature atheromatous calcification. Skull: Normal. Negative for fracture or focal lesion. Sinuses/Orbits: No acute finding. Other: These results were called by telephone at the time of interpretation on 01/19/2021 at 7:59 am to provider Merlyn Lot , who verbally acknowledged these results. ASPECTS [facility] Stroke Program Early CT Score) No acute infarct. IMPRESSION: 1. No acute finding. 2. 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 Electronically Signed   By: Jorje Guild M.D.   On: 01/19/2021 08:01   CT ANGIO HEAD CODE STROKE  Result Date: 01/19/2021 CLINICAL DATA:  Generalized weakness with aphasia. Left-sided deficits. Vomiting. EXAM: CT ANGIOGRAPHY HEAD AND NECK TECHNIQUE: Multidetector CT imaging of the head and neck was performed using the standard protocol during bolus administration of intravenous contrast. Multiplanar CT image reconstructions and MIPs were obtained to evaluate the vascular anatomy. Carotid stenosis measurements (when applicable) are obtained utilizing NASCET criteria, using the distal internal carotid diameter as the denominator. CONTRAST:  32mL OMNIPAQUE IOHEXOL 350 MG/ML SOLN COMPARISON:  None. FINDINGS: CTA NECK FINDINGS Aortic arch: Atheromatous plaque. 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 No dissection or ulceration. Left carotid system: Age advanced atheromatous plaque along the common carotid and proximal ICA without flow limiting stenosis or ulceration. Vertebral arteries: Aberrant right subclavian artery. Significant for age atheromatous plaque at the bilateral proximal subclavian, but without flow limiting stenosis. Heavily diseased vertebral arteries with extensive calcific plaque and multifocal narrowing. The origins are difficult to assess due to small vessel size, calcified plaque, and body habitus. Both vertebral arteries are patent at the dura although there is subsequent occlusion on the left. Skeleton: No acute finding Other neck: No acute finding Upper chest: No acute finding Review of the MIP images confirms the above findings CTA HEAD FINDINGS Anterior circulation: Heavily diseased carotid siphon with calcified plaque and multifocal luminal stenosis. The degree of calcified plaque limits lumen measurement, expect flow limiting stenosis on both sides. 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 Extensive atheromatous plaque affecting the M1 segments and MCA branches. Moderate narrowing at the bilateral M1 segment. Negative for aneurysm. Posterior circulation: Heavily diseased vertebral arteries with calcified plaque. Severe right vertebral stenosis beyond the PICA. Left proximal V4 segment occlusion with potentially retrograde flow seen at the proximal left PICA. Narrow basilar diffusely with 2 segments of non enhancement. Fetal type bilateral PCA flow. Venous sinuses: No emergent finding Anatomic variants: As above Review of the MIP images confirms the above findings Critical Value/emergent results were called by telephone at the time of interpretation on 01/19/2021 at 8:42 am to provider ERIC [facility] , who  verbally acknowledged these results. IMPRESSION: 1. Generalized and severe atherosclerosis, especially for age. 2. Dominant findings in the posterior circulation where there are left vertebral and basilar occlusions which are age indeterminate. Severe right V4 segment stenosis. 3. No flow limiting stenosis in the cervical carotid circulation. 4. Probable flow limiting stenosis at the carotid siphons, especially on the right. Electronically Signed   By: Jorje Guild M.D.   On: 01/19/2021 08:50   CT ANGIO NECK CODE STROKE  Result Date: 01/19/2021 CLINICAL DATA:  Generalized weakness with aphasia. Left-sided deficits. Vomiting. EXAM: CT ANGIOGRAPHY HEAD AND NECK TECHNIQUE: Multidetector CT imaging of the head and neck was performed using the standard protocol during bolus administration of intravenous contrast. Multiplanar CT image reconstructions and MIPs were obtained to evaluate the vascular anatomy. Carotid stenosis measurements (when applicable) are obtained utilizing NASCET criteria, using the distal internal carotid diameter as the denominator. CONTRAST:  44mL OMNIPAQUE IOHEXOL 350 MG/ML SOLN COMPARISON:  None. FINDINGS: CTA NECK FINDINGS Aortic arch: Atheromatous plaque. 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 No dissection or ulceration. Left carotid system: Age advanced atheromatous plaque along the common carotid and proximal ICA without flow limiting stenosis or ulceration. Vertebral arteries: Aberrant right subclavian artery. Significant for age atheromatous plaque at the bilateral proximal subclavian, but without flow limiting stenosis. Heavily diseased vertebral arteries with extensive calcific plaque and multifocal narrowing. The origins are difficult to assess due to small vessel size, calcified  plaque, and body habitus. Both vertebral arteries are patent at the dura although there is subsequent occlusion on the left. Skeleton: No acute finding Other neck: No acute finding Upper chest: No acute finding  Review of the MIP images confirms the above findings CTA HEAD FINDINGS Anterior circulation: Heavily diseased carotid siphon with calcified plaque and multifocal luminal stenosis. The degree of calcified plaque limits lumen measurement, expect flow limiting stenosis on both sides. 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 Extensive atheromatous plaque affecting the M1 segments and MCA branches. Moderate narrowing at the bilateral M1 segment. Negative for aneurysm. Posterior circulation: Heavily diseased vertebral arteries with calcified plaque. Severe right vertebral stenosis beyond the PICA. Left proximal V4 segment occlusion with potentially retrograde flow seen at the proximal left PICA. Narrow basilar diffusely with 2 segments of non enhancement. Fetal type bilateral PCA flow. Venous sinuses: No emergent finding Anatomic variants: As above Review of the MIP images confirms the above findings Critical Value/emergent results were called by telephone at the time of interpretation on 01/19/2021 at 8:42 am to provider ERIC [facility] , who verbally acknowledged these results. IMPRESSION: 1. Generalized and severe atherosclerosis, especially for age. 2. Dominant findings in the posterior circulation where there are left vertebral and basilar occlusions which are age indeterminate. Severe right V4 segment stenosis. 3. No flow limiting stenosis in the cervical carotid circulation. 4. Probable flow limiting stenosis at the carotid siphons, especially on the right. Electronically Signed   By: Jorje Guild M.D.   On: 01/19/2021 08:50    Subjective: Patient feels improved today with continued weakness in bilateral legs and difficulty with speech.  Discharge Exam: Vitals:   02/02/21 0351  02/02/21 0814  BP: (!) 101/50 121/75  Pulse: 97 (!) 109  Resp: 16 16  Temp: 98.4 F (36.9 C) 97.9 F (36.6 C)  SpO2: 100% 100%    General: Pt is alert, awake, not in acute distress Cardiovascular: RRR, S1/S2 +, no rubs, no gallops Respiratory: CTA bilaterally, no wheezing, no rhonchi Abdominal: Soft, NT, ND, bowel sounds + Extremities: no edema, no cyanosis. Chronic contracture of left hand  Labs: Basic Metabolic Panel: Recent Labs  Lab 01/29/21 0525  NA 137  K 4.1  CL 105  CO2 27  GLUCOSE 119*  BUN 9  CREATININE 0.80  CALCIUM 8.2*   CBC: Recent Labs  Lab 01/29/21 0525  WBC 13.5*  HGB 11.8*  HCT 35.4*  MCV 79.4*  PLT 339    Microbiology Recent Results (from the past 240 hour(s))  Resp Panel by RT-PCR (Flu A&B, Covid) Nasopharyngeal Swab     Status: None   Collection Time: 02/01/21  3:08 PM   Specimen: Nasopharyngeal Swab; Nasopharyngeal(NP) swabs in vial transport medium  Result Value Ref Range Status   SARS Coronavirus 2 by RT PCR NEGATIVE NEGATIVE Final    Comment: (NOTE) SARS-CoV-2 target nucleic acids are NOT DETECTED.  The SARS-CoV-2 RNA is generally detectable in upper respiratory specimens during the acute phase of infection. The lowest concentration of SARS-CoV-2 viral copies this assay can detect is 138 copies/mL. A negative result does not preclude SARS-Cov-2 infection and should not be used as the sole basis for treatment or other patient management decisions. A negative result may occur with  improper specimen collection/handling, submission of specimen other than nasopharyngeal swab, presence of viral mutation(s) within the areas targeted by this assay, and inadequate number of viral copies(<138 copies/mL). A negative result must be combined with clinical observations, patient history, and epidemiological information. The expected result is Negative.  Fact Sheet for Patients:  EntrepreneurPulse.com.au  Fact Sheet for  Healthcare Providers:  IncredibleEmployment.be  This test is no t yet approved or  cleared by the [place] and  has been authorized for detection and/or diagnosis of SARS-CoV-2 by FDA under an Emergency Use Authorization (EUA). This EUA will remain  in effect (meaning this test can be used) for the duration of the COVID-19 declaration under Section 564(b)(1) of the Act, 21 U.S.C.section 360bbb-3(b)(1), unless the authorization is terminated  or revoked sooner.       Influenza A by PCR NEGATIVE NEGATIVE Final   Influenza B by PCR NEGATIVE NEGATIVE Final    Comment: (NOTE) The Xpert Xpress SARS-CoV-2/FLU/RSV plus assay is intended as an aid in the diagnosis of influenza from Nasopharyngeal swab specimens and should not be used as a sole basis for treatment. Nasal washings and aspirates are unacceptable for Xpert Xpress SARS-CoV-2/FLU/RSV testing.  Fact Sheet for Patients: EntrepreneurPulse.com.au  Fact Sheet for Healthcare Providers: IncredibleEmployment.be  This test is not yet approved or cleared by the [place] FDA and has been authorized for detection and/or diagnosis of SARS-CoV-2 by FDA under an Emergency Use Authorization (EUA). This EUA will remain in effect (meaning this test can be used) for the duration of the COVID-19 declaration under Section 564(b)(1) of the Act, 21 U.S.C. section 360bbb-3(b)(1), unless the authorization is terminated or revoked.  Performed at [facility], [address] [place], [place] [address]     Time coordinating discharge: Over 30 minutes  Richarda Osmond, MD  [facility] 02/02/2021, 9:37 AM

## 2021-02-02 NOTE — TOC Transition Note (Addendum)
Transition of Care [facility]) - CM/SW Discharge Note   Patient Details  Name: Annella Prowell MRN: [id number] Date of Birth: 1970/12/10  Transition of Care [facility]) CM/SW Contact:  Candie Chroman, LCSW Phone Number: 02/02/2021, 10:57 AM   Clinical Narrative:   Patient has orders to discharge to [facility] (New name: [facility]) today. RN will call report to [phone] (Room 111). EMS transport has been arranged and she is 1st on the list. Development worker, community has spoken with [facility] and they are switching her Medicaid to [facility]. Patient is aware. No further concerns. CSW signing off.  Final next level of care: Skilled Nursing Facility Barriers to Discharge: Barriers Resolved   Patient Goals and CMS Choice   CMS Medicare.gov Compare Post Acute Care list provided to:: Patient Choice offered to / list presented to : Patient  Discharge Placement   Existing PASRR number confirmed : 02/01/21          Patient chooses bed at: Other - please specify in the comment section below: [facility] (New name: [facility])) Patient to be transferred to facility by: EMS   Patient and family notified of of transfer: 02/02/21  Discharge Plan and Services     Post Acute Care Choice: IP Rehab                               Social Determinants of Health (SDOH) Interventions     Readmission Risk Interventions No flowsheet data found.

## 2021-02-02 NOTE — Progress Notes (Signed)
Patient discharged via EMS. Orma Flaming, RN

## 2021-02-06 ENCOUNTER — Inpatient Hospital Stay (HOSPITAL_COMMUNITY)
Admission: EM | Admit: 2021-02-06 | Discharge: 2021-02-15 | DRG: 872 | Disposition: A | Discharge status: Skilled Nursing Facility | Payer: Medicare Other | Source: Skilled Nursing Facility | Attending: Internal Medicine | Admitting: Internal Medicine

## 2021-02-06 ENCOUNTER — Encounter (HOSPITAL_COMMUNITY): Payer: Self-pay

## 2021-02-06 DIAGNOSIS — I639 Cerebral infarction, unspecified: Secondary | ICD-10-CM | POA: Diagnosis present

## 2021-02-06 DIAGNOSIS — I10 Essential (primary) hypertension: Secondary | ICD-10-CM | POA: Diagnosis present

## 2021-02-06 DIAGNOSIS — N390 Urinary tract infection, site not specified: Secondary | ICD-10-CM

## 2021-02-06 DIAGNOSIS — I2510 Atherosclerotic heart disease of native coronary artery without angina pectoris: Secondary | ICD-10-CM | POA: Diagnosis present

## 2021-02-06 DIAGNOSIS — B961 Klebsiella pneumoniae [K. pneumoniae] as the cause of diseases classified elsewhere: Secondary | ICD-10-CM | POA: Diagnosis present

## 2021-02-06 DIAGNOSIS — A419 Sepsis, unspecified organism: Secondary | ICD-10-CM | POA: Diagnosis not present

## 2021-02-06 DIAGNOSIS — E109 Type 1 diabetes mellitus without complications: Secondary | ICD-10-CM

## 2021-02-06 DIAGNOSIS — Z91041 Radiographic dye allergy status: Secondary | ICD-10-CM

## 2021-02-06 DIAGNOSIS — Z20822 Contact with and (suspected) exposure to covid-19: Secondary | ICD-10-CM | POA: Diagnosis present

## 2021-02-06 DIAGNOSIS — F419 Anxiety disorder, unspecified: Secondary | ICD-10-CM | POA: Diagnosis present

## 2021-02-06 DIAGNOSIS — Z951 Presence of aortocoronary bypass graft: Secondary | ICD-10-CM

## 2021-02-06 DIAGNOSIS — Z8673 Personal history of transient ischemic attack (TIA), and cerebral infarction without residual deficits: Secondary | ICD-10-CM | POA: Diagnosis present

## 2021-02-06 DIAGNOSIS — B9620 Unspecified Escherichia coli [E. coli] as the cause of diseases classified elsewhere: Secondary | ICD-10-CM | POA: Diagnosis present

## 2021-02-06 DIAGNOSIS — Z7982 Long term (current) use of aspirin: Secondary | ICD-10-CM

## 2021-02-06 DIAGNOSIS — E119 Type 2 diabetes mellitus without complications: Secondary | ICD-10-CM | POA: Diagnosis present

## 2021-02-06 DIAGNOSIS — Z888 Allergy status to other drugs, medicaments and biological substances status: Secondary | ICD-10-CM

## 2021-02-06 DIAGNOSIS — E785 Hyperlipidemia, unspecified: Secondary | ICD-10-CM | POA: Diagnosis present

## 2021-02-06 DIAGNOSIS — Z794 Long term (current) use of insulin: Secondary | ICD-10-CM

## 2021-02-06 DIAGNOSIS — R112 Nausea with vomiting, unspecified: Secondary | ICD-10-CM | POA: Diagnosis not present

## 2021-02-06 DIAGNOSIS — G8194 Hemiplegia, unspecified affecting left nondominant side: Secondary | ICD-10-CM | POA: Diagnosis present

## 2021-02-06 DIAGNOSIS — E0590 Thyrotoxicosis, unspecified without thyrotoxic crisis or storm: Secondary | ICD-10-CM

## 2021-02-06 DIAGNOSIS — R471 Dysarthria and anarthria: Secondary | ICD-10-CM | POA: Diagnosis present

## 2021-02-06 DIAGNOSIS — Z880 Allergy status to penicillin: Secondary | ICD-10-CM

## 2021-02-06 DIAGNOSIS — Z833 Family history of diabetes mellitus: Secondary | ICD-10-CM

## 2021-02-06 DIAGNOSIS — Z79899 Other long term (current) drug therapy: Secondary | ICD-10-CM

## 2021-02-06 DIAGNOSIS — F32A Depression, unspecified: Secondary | ICD-10-CM | POA: Diagnosis present

## 2021-02-06 NOTE — ED Triage Notes (Signed)
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

## 2021-02-07 ENCOUNTER — Inpatient Hospital Stay (HOSPITAL_COMMUNITY): Payer: Medicare Other

## 2021-02-07 ENCOUNTER — Emergency Department (HOSPITAL_COMMUNITY): Payer: Medicare Other

## 2021-02-07 DIAGNOSIS — I10 Essential (primary) hypertension: Secondary | ICD-10-CM | POA: Diagnosis present

## 2021-02-07 DIAGNOSIS — R471 Dysarthria and anarthria: Secondary | ICD-10-CM | POA: Diagnosis present

## 2021-02-07 DIAGNOSIS — Z951 Presence of aortocoronary bypass graft: Secondary | ICD-10-CM | POA: Diagnosis not present

## 2021-02-07 DIAGNOSIS — Z888 Allergy status to other drugs, medicaments and biological substances status: Secondary | ICD-10-CM | POA: Diagnosis not present

## 2021-02-07 DIAGNOSIS — I639 Cerebral infarction, unspecified: Secondary | ICD-10-CM | POA: Diagnosis not present

## 2021-02-07 DIAGNOSIS — I2510 Atherosclerotic heart disease of native coronary artery without angina pectoris: Secondary | ICD-10-CM | POA: Diagnosis present

## 2021-02-07 DIAGNOSIS — E785 Hyperlipidemia, unspecified: Secondary | ICD-10-CM | POA: Diagnosis present

## 2021-02-07 DIAGNOSIS — Z91041 Radiographic dye allergy status: Secondary | ICD-10-CM | POA: Diagnosis not present

## 2021-02-07 DIAGNOSIS — N390 Urinary tract infection, site not specified: Secondary | ICD-10-CM | POA: Diagnosis present

## 2021-02-07 DIAGNOSIS — A419 Sepsis, unspecified organism: Secondary | ICD-10-CM | POA: Diagnosis present

## 2021-02-07 DIAGNOSIS — Z7982 Long term (current) use of aspirin: Secondary | ICD-10-CM | POA: Diagnosis not present

## 2021-02-07 DIAGNOSIS — B961 Klebsiella pneumoniae [K. pneumoniae] as the cause of diseases classified elsewhere: Secondary | ICD-10-CM | POA: Diagnosis present

## 2021-02-07 DIAGNOSIS — R112 Nausea with vomiting, unspecified: Secondary | ICD-10-CM | POA: Diagnosis present

## 2021-02-07 DIAGNOSIS — G8194 Hemiplegia, unspecified affecting left nondominant side: Secondary | ICD-10-CM | POA: Diagnosis present

## 2021-02-07 DIAGNOSIS — Z794 Long term (current) use of insulin: Secondary | ICD-10-CM | POA: Diagnosis not present

## 2021-02-07 DIAGNOSIS — Z79899 Other long term (current) drug therapy: Secondary | ICD-10-CM | POA: Diagnosis not present

## 2021-02-07 DIAGNOSIS — B9620 Unspecified Escherichia coli [E. coli] as the cause of diseases classified elsewhere: Secondary | ICD-10-CM | POA: Diagnosis present

## 2021-02-07 DIAGNOSIS — F419 Anxiety disorder, unspecified: Secondary | ICD-10-CM | POA: Diagnosis present

## 2021-02-07 DIAGNOSIS — Z20822 Contact with and (suspected) exposure to covid-19: Secondary | ICD-10-CM | POA: Diagnosis present

## 2021-02-07 DIAGNOSIS — F32A Depression, unspecified: Secondary | ICD-10-CM | POA: Diagnosis present

## 2021-02-07 DIAGNOSIS — Z833 Family history of diabetes mellitus: Secondary | ICD-10-CM | POA: Diagnosis not present

## 2021-02-07 DIAGNOSIS — E119 Type 2 diabetes mellitus without complications: Secondary | ICD-10-CM | POA: Diagnosis present

## 2021-02-07 DIAGNOSIS — Z880 Allergy status to penicillin: Secondary | ICD-10-CM | POA: Diagnosis not present

## 2021-02-07 DIAGNOSIS — E0590 Thyrotoxicosis, unspecified without thyrotoxic crisis or storm: Secondary | ICD-10-CM | POA: Diagnosis present

## 2021-02-07 LAB — LIPASE, BLOOD: Lipase: 29 U/L (ref 11–51)

## 2021-02-07 LAB — URINALYSIS, ROUTINE W REFLEX MICROSCOPIC
Bilirubin Urine: NEGATIVE
Glucose, UA: 150 mg/dL — AB
Ketones, ur: 5 mg/dL — AB
Nitrite: NEGATIVE
Protein, ur: NEGATIVE mg/dL
Specific Gravity, Urine: 1.014 (ref 1.005–1.030)
WBC, UA: >50 WBC/hpf — ABNORMAL HIGH (ref 0–5)
pH: 7 (ref 5.0–8.0)

## 2021-02-07 LAB — COMPREHENSIVE METABOLIC PANEL
ALT: 25 U/L (ref 0–44)
AST: 18 U/L (ref 15–41)
Albumin: 3.8 g/dL (ref 3.5–5.0)
Alkaline Phosphatase: 91 U/L (ref 38–126)
Anion gap: 12 (ref 5–15)
BUN: 9 mg/dL (ref 6–20)
CO2: 23 mmol/L (ref 22–32)
Calcium: 9.3 mg/dL (ref 8.9–10.3)
Chloride: 100 mmol/L (ref 98–111)
Creatinine, Ser: 0.69 mg/dL (ref 0.44–1.00)
GFR, Estimated: >60 mL/min (ref >60)
Glucose, Bld: 228 mg/dL — ABNORMAL HIGH (ref 70–99)
Potassium: 4.3 mmol/L (ref 3.5–5.1)
Sodium: 135 mmol/L (ref 135–145)
Total Bilirubin: 0.8 mg/dL (ref 0.3–1.2)
Total Protein: 8.1 g/dL (ref 6.5–8.1)

## 2021-02-07 LAB — CBC WITH DIFFERENTIAL/PLATELET
Abs Immature Granulocytes: 0.04 10*3/uL (ref 0.00–0.07)
Basophils Absolute: 0.1 10*3/uL (ref 0.0–0.1)
Basophils Relative: 1 %
Eosinophils Absolute: 0.1 10*3/uL (ref 0.0–0.5)
Eosinophils Relative: 1 %
HCT: 41.9 % (ref 36.0–46.0)
Hemoglobin: 13.9 g/dL (ref 12.0–15.0)
Immature Granulocytes: 0 %
Lymphocytes Relative: 18 %
Lymphs Abs: 2.2 10*3/uL (ref 0.7–4.0)
MCH: 27.3 pg (ref 26.0–34.0)
MCHC: 33.2 g/dL (ref 30.0–36.0)
MCV: 82.2 fL (ref 80.0–100.0)
Monocytes Absolute: 0.8 10*3/uL (ref 0.1–1.0)
Monocytes Relative: 6 %
Neutro Abs: 9.2 10*3/uL — ABNORMAL HIGH (ref 1.7–7.7)
Neutrophils Relative %: 74 %
Platelets: 501 10*3/uL — ABNORMAL HIGH (ref 150–400)
RBC: 5.1 MIL/uL (ref 3.87–5.11)
RDW: 16.1 % — ABNORMAL HIGH (ref 11.5–15.5)
WBC: 12.3 10*3/uL — ABNORMAL HIGH (ref 4.0–10.5)
nRBC: 0 % (ref 0.0–0.2)

## 2021-02-07 LAB — RESP PANEL BY RT-PCR (FLU A&B, COVID) ARPGX2
Influenza A by PCR: NEGATIVE
Influenza B by PCR: NEGATIVE
SARS Coronavirus 2 by RT PCR: NEGATIVE

## 2021-02-07 LAB — TSH: TSH: 1.017 u[IU]/mL (ref 0.350–4.500)

## 2021-02-07 LAB — LACTIC ACID, PLASMA
Lactic Acid, Venous: 2.2 mmol/L (ref 0.5–1.9)
Lactic Acid, Venous: 2 mmol/L (ref 0.5–1.9)

## 2021-02-07 LAB — CBG MONITORING, ED
Glucose-Capillary: 260 mg/dL — ABNORMAL HIGH (ref 70–99)
Glucose-Capillary: 315 mg/dL — ABNORMAL HIGH (ref 70–99)

## 2021-02-07 LAB — GLUCOSE, CAPILLARY: Glucose-Capillary: 229 mg/dL — ABNORMAL HIGH (ref 70–99)

## 2021-02-07 LAB — I-STAT BETA HCG BLOOD, ED (MC, WL, AP ONLY): I-stat hCG, quantitative: <5 m[IU]/mL (ref <5)

## 2021-02-07 LAB — T4, FREE: Free T4: 1.01 ng/dL (ref 0.61–1.12)

## 2021-02-07 IMAGING — DX DG CHEST 1V PORT
1 series · 1 of 1 positions shown · non-contrast
Comparison: [DATE]

CLINICAL DATA: febrile

EXAM:
PORTABLE CHEST 1 VIEW

[chest ap]
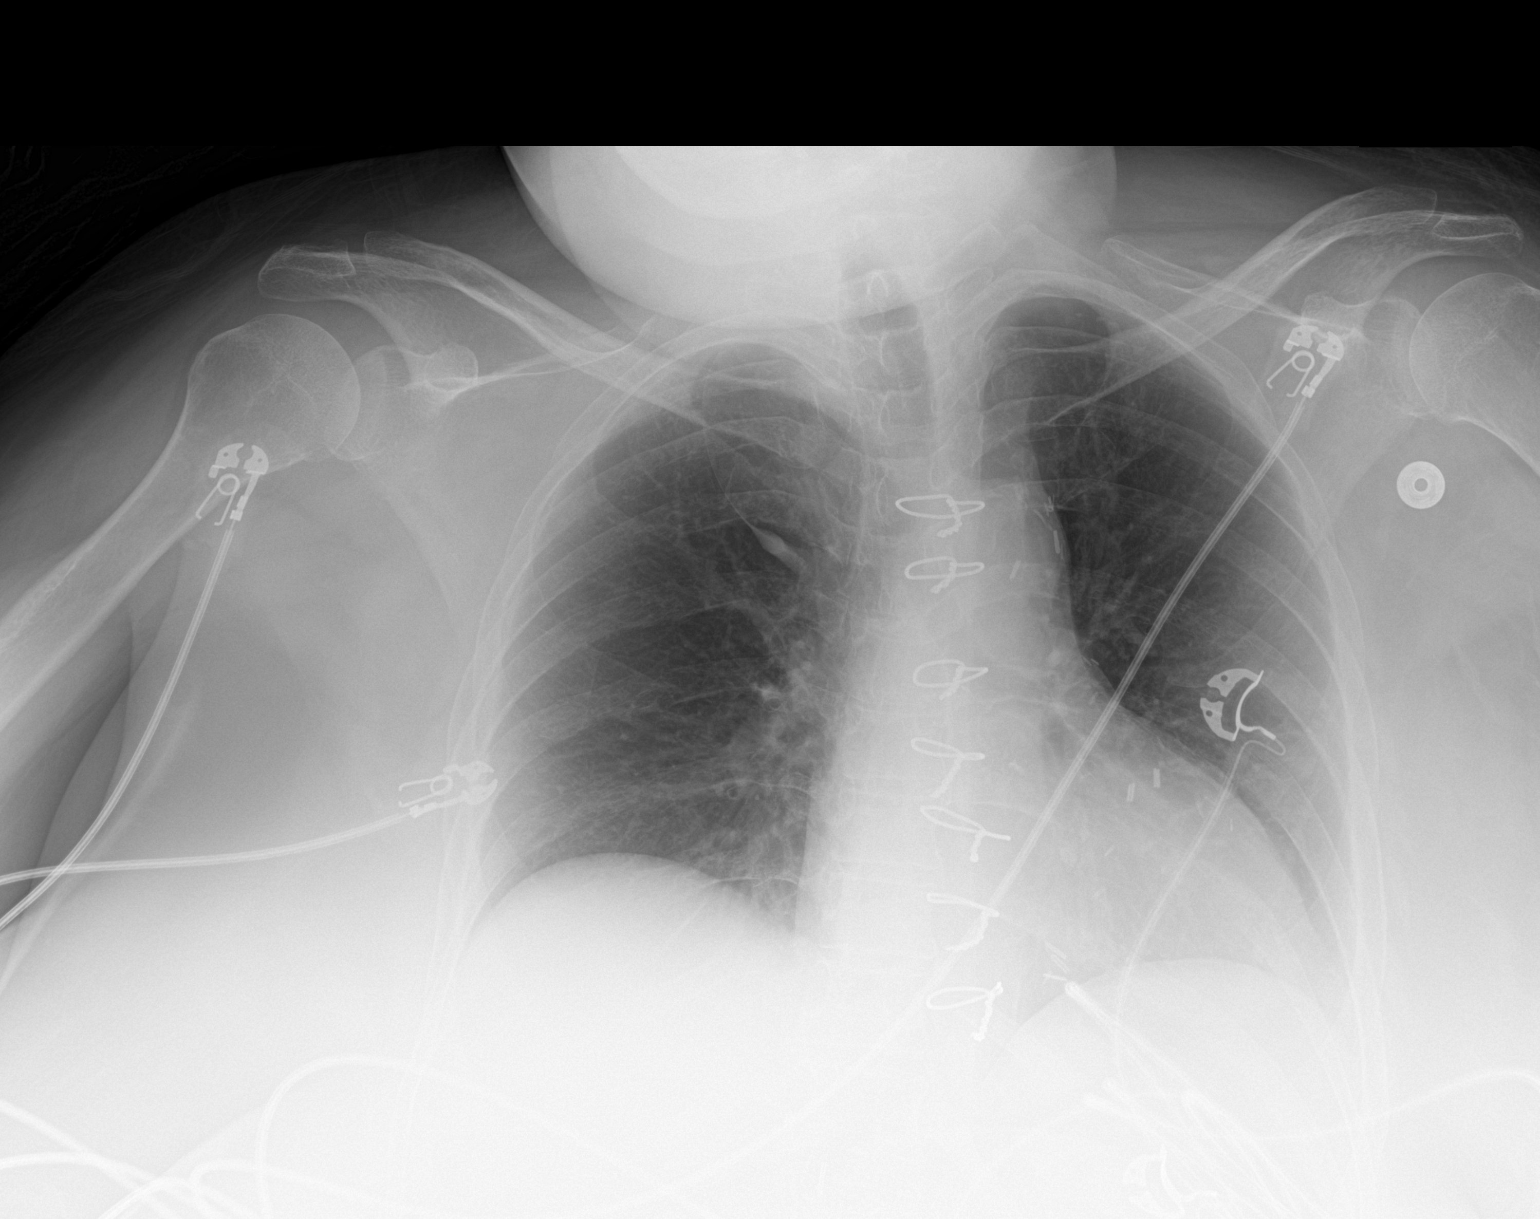

[1 of 1 positions shown; findings below may reference images not displayed]

FINDINGS: The cardiomediastinal silhouette is unchanged in contour.Status post
median sternotomy. Atherosclerotic calcifications of the aorta. No
pleural effusion. No pneumothorax. No acute pleuroparenchymal
abnormality. Visualized abdomen is unremarkable. Incidental note of
an azygous fissure.
IMPRESSION: No acute cardiopulmonary abnormality.

## 2021-02-07 MED ORDER — HEPARIN SODIUM (PORCINE) 5000 UNIT/ML IJ SOLN
5000.0000 [IU] | Freq: Three times a day (TID) | INTRAMUSCULAR | Status: DC
  Administered 2021-02-08 – 2021-02-15 (×12): 5000 [IU] via SUBCUTANEOUS
  Filled 2021-02-07 (×19): qty 1

## 2021-02-07 MED ORDER — METOPROLOL TARTRATE 5 MG/5ML IV SOLN
5.0000 mg | Freq: Four times a day (QID) | INTRAVENOUS | Status: DC | PRN
  Administered 2021-02-07 – 2021-02-10 (×4): 5 mg via INTRAVENOUS
  Filled 2021-02-07 (×5): qty 5

## 2021-02-07 MED ORDER — SODIUM CHLORIDE 0.9 % IV SOLN
12.5000 mg | Freq: Once | INTRAVENOUS | Status: AC
  Administered 2021-02-07: 12.5 mg via INTRAVENOUS
  Filled 2021-02-07: qty 12.5

## 2021-02-07 MED ORDER — ACETAMINOPHEN 650 MG RE SUPP: 650.0000 mg | Freq: Four times a day (QID) | RECTAL | Status: DC | PRN

## 2021-02-07 MED ORDER — METOCLOPRAMIDE HCL 5 MG/ML IJ SOLN
10.0000 mg | Freq: Three times a day (TID) | INTRAMUSCULAR | Status: DC
  Administered 2021-02-07 – 2021-02-09 (×7): 10 mg via INTRAVENOUS
  Filled 2021-02-07 (×7): qty 2

## 2021-02-07 MED ORDER — ONDANSETRON HCL 4 MG/2ML IJ SOLN
4.0000 mg | Freq: Once | INTRAMUSCULAR | Status: AC
Start: 2021-02-07 — End: 2021-02-07
  Administered 2021-02-07: 4 mg via INTRAVENOUS
  Filled 2021-02-07: qty 2

## 2021-02-07 MED ORDER — LACTATED RINGERS IV BOLUS (SEPSIS)
1000.0000 mL | Freq: Once | INTRAVENOUS | Status: AC
  Administered 2021-02-07: 1000 mL via INTRAVENOUS

## 2021-02-07 MED ORDER — CYCLOBENZAPRINE HCL 5 MG PO TABS
5.0000 mg | ORAL_TABLET | Freq: Three times a day (TID) | ORAL | Status: DC
  Administered 2021-02-07 – 2021-02-13 (×18): 5 mg via ORAL
  Filled 2021-02-07 (×18): qty 1

## 2021-02-07 MED ORDER — INSULIN ASPART 100 UNIT/ML IJ SOLN
0.0000 [IU] | Freq: Every day | INTRAMUSCULAR | Status: DC
  Administered 2021-02-10: 3 [IU] via SUBCUTANEOUS
  Filled 2021-02-07: qty 0.05

## 2021-02-07 MED ORDER — ZOLPIDEM TARTRATE 5 MG PO TABS
5.0000 mg | ORAL_TABLET | Freq: Every day | ORAL | Status: DC
  Administered 2021-02-07 – 2021-02-15 (×9): 5 mg via ORAL
  Filled 2021-02-07 (×9): qty 1

## 2021-02-07 MED ORDER — SODIUM CHLORIDE 0.9 % IV BOLUS
1000.0000 mL | Freq: Once | INTRAVENOUS | Status: AC
  Administered 2021-02-07: 1000 mL via INTRAVENOUS

## 2021-02-07 MED ORDER — LORAZEPAM 0.5 MG PO TABS
0.2500 mg | ORAL_TABLET | Freq: Every day | ORAL | Status: DC
Start: 2021-02-07 — End: 2021-02-16
  Administered 2021-02-07 – 2021-02-15 (×9): 0.25 mg via ORAL
  Filled 2021-02-07 (×9): qty 1

## 2021-02-07 MED ORDER — ACETAMINOPHEN 325 MG PO TABS
650.0000 mg | ORAL_TABLET | Freq: Four times a day (QID) | ORAL | Status: DC | PRN
  Filled 2021-02-07: qty 2

## 2021-02-07 MED ORDER — LACTATED RINGERS IV SOLN: INTRAVENOUS | Status: AC

## 2021-02-07 MED ORDER — LISINOPRIL 20 MG PO TABS
20.0000 mg | ORAL_TABLET | Freq: Every day | ORAL | Status: DC
Start: 2021-02-08 — End: 2021-02-16
  Administered 2021-02-08 – 2021-02-15 (×8): 20 mg via ORAL
  Filled 2021-02-07 (×8): qty 1

## 2021-02-07 MED ORDER — LORATADINE 10 MG PO TABS
10.0000 mg | ORAL_TABLET | Freq: Every day | ORAL | Status: DC
  Administered 2021-02-07 – 2021-02-15 (×9): 10 mg via ORAL
  Filled 2021-02-07 (×9): qty 1

## 2021-02-07 MED ORDER — FAMOTIDINE IN NACL 20-0.9 MG/50ML-% IV SOLN
20.0000 mg | Freq: Once | INTRAVENOUS | Status: AC
  Administered 2021-02-07: 20 mg via INTRAVENOUS

## 2021-02-07 MED ORDER — PROCHLORPERAZINE EDISYLATE 10 MG/2ML IJ SOLN
10.0000 mg | Freq: Four times a day (QID) | INTRAMUSCULAR | Status: DC | PRN
  Administered 2021-02-07 – 2021-02-14 (×8): 10 mg via INTRAVENOUS
  Filled 2021-02-07 (×9): qty 2

## 2021-02-07 MED ORDER — METHIMAZOLE 5 MG PO TABS
5.0000 mg | ORAL_TABLET | Freq: Every day | ORAL | Status: DC
  Administered 2021-02-07 – 2021-02-15 (×9): 5 mg via ORAL
  Filled 2021-02-07 (×9): qty 1

## 2021-02-07 MED ORDER — CLOPIDOGREL BISULFATE 75 MG PO TABS
75.0000 mg | ORAL_TABLET | Freq: Every day | ORAL | Status: DC
  Administered 2021-02-08 – 2021-02-15 (×8): 75 mg via ORAL
  Filled 2021-02-07 (×8): qty 1

## 2021-02-07 MED ORDER — ACETAMINOPHEN 500 MG PO TABS
1000.0000 mg | ORAL_TABLET | Freq: Once | ORAL | Status: DC
  Filled 2021-02-07: qty 2

## 2021-02-07 MED ORDER — ASPIRIN 81 MG PO CHEW
81.0000 mg | CHEWABLE_TABLET | Freq: Every day | ORAL | Status: DC
  Administered 2021-02-07 – 2021-02-15 (×9): 81 mg via ORAL
  Filled 2021-02-07 (×9): qty 1

## 2021-02-07 MED ORDER — INSULIN GLARGINE-YFGN 100 UNIT/ML ~~LOC~~ SOLN
25.0000 [IU] | Freq: Every day | SUBCUTANEOUS | Status: DC
  Administered 2021-02-07: 25 [IU] via SUBCUTANEOUS
  Filled 2021-02-07 (×2): qty 0.25

## 2021-02-07 MED ORDER — AMLODIPINE BESYLATE 5 MG PO TABS
5.0000 mg | ORAL_TABLET | Freq: Every day | ORAL | Status: DC
  Administered 2021-02-07 – 2021-02-15 (×9): 5 mg via ORAL
  Filled 2021-02-07 (×9): qty 1

## 2021-02-07 MED ORDER — DIPHENHYDRAMINE HCL 50 MG/ML IJ SOLN
12.5000 mg | Freq: Once | INTRAMUSCULAR | Status: AC
  Administered 2021-02-07: 12.5 mg via INTRAVENOUS
  Filled 2021-02-07: qty 1

## 2021-02-07 MED ORDER — SODIUM CHLORIDE 0.9 % IV SOLN
2.0000 g | INTRAVENOUS | Status: AC
  Administered 2021-02-07 – 2021-02-13 (×7): 2 g via INTRAVENOUS
  Filled 2021-02-07 (×7): qty 20

## 2021-02-07 MED ORDER — POLYETHYLENE GLYCOL 3350 17 G PO PACK
17.0000 g | PACK | Freq: Every day | ORAL | Status: DC
  Administered 2021-02-08 – 2021-02-12 (×3): 17 g via ORAL
  Filled 2021-02-07 (×5): qty 1

## 2021-02-07 MED ORDER — METOPROLOL TARTRATE 50 MG PO TABS
100.0000 mg | ORAL_TABLET | Freq: Two times a day (BID) | ORAL | Status: DC
  Administered 2021-02-08 – 2021-02-15 (×16): 100 mg via ORAL
  Filled 2021-02-07 (×17): qty 2

## 2021-02-07 MED ORDER — INSULIN ASPART 100 UNIT/ML IJ SOLN
0.0000 [IU] | Freq: Three times a day (TID) | INTRAMUSCULAR | Status: DC
  Administered 2021-02-07: 5 [IU] via SUBCUTANEOUS
  Administered 2021-02-08: 3 [IU] via SUBCUTANEOUS
  Administered 2021-02-08: 2 [IU] via SUBCUTANEOUS
  Administered 2021-02-09: 5 [IU] via SUBCUTANEOUS
  Administered 2021-02-09: 2 [IU] via SUBCUTANEOUS
  Administered 2021-02-10: 3 [IU] via SUBCUTANEOUS
  Administered 2021-02-10 (×2): 5 [IU] via SUBCUTANEOUS
  Administered 2021-02-11: 2 [IU] via SUBCUTANEOUS
  Administered 2021-02-11: 5 [IU] via SUBCUTANEOUS
  Administered 2021-02-11: 3 [IU] via SUBCUTANEOUS
  Administered 2021-02-12: 5 [IU] via SUBCUTANEOUS
  Administered 2021-02-12 – 2021-02-13 (×3): 3 [IU] via SUBCUTANEOUS
  Administered 2021-02-13: 2 [IU] via SUBCUTANEOUS
  Administered 2021-02-13 – 2021-02-14 (×3): 3 [IU] via SUBCUTANEOUS
  Administered 2021-02-14: 8 [IU] via SUBCUTANEOUS
  Administered 2021-02-15 (×2): 2 [IU] via SUBCUTANEOUS
  Administered 2021-02-15: 3 [IU] via SUBCUTANEOUS
  Filled 2021-02-07: qty 0.15

## 2021-02-07 MED ORDER — FAMOTIDINE IN NACL 20-0.9 MG/50ML-% IV SOLN
INTRAVENOUS | Status: AC
  Filled 2021-02-07: qty 50

## 2021-02-07 MED ORDER — ESCITALOPRAM OXALATE 20 MG PO TABS
20.0000 mg | ORAL_TABLET | Freq: Every day | ORAL | Status: DC
  Administered 2021-02-08 – 2021-02-15 (×8): 20 mg via ORAL
  Filled 2021-02-07 (×8): qty 1

## 2021-02-07 MED ORDER — ATORVASTATIN CALCIUM 40 MG PO TABS
80.0000 mg | ORAL_TABLET | Freq: Every day | ORAL | Status: DC
  Administered 2021-02-08 – 2021-02-15 (×8): 80 mg via ORAL
  Filled 2021-02-07 (×8): qty 2

## 2021-02-07 MED ORDER — GABAPENTIN 100 MG PO CAPS
100.0000 mg | ORAL_CAPSULE | Freq: Every day | ORAL | Status: DC
  Administered 2021-02-07 – 2021-02-11 (×5): 100 mg via ORAL
  Filled 2021-02-07 (×5): qty 1

## 2021-02-07 NOTE — Sepsis Progress Note (Signed)
Confirmed via secure chat with bedside staff I-Li RN and Destiny RN that blood cultures were drawn PRIOR to antibiotic administration.

## 2021-02-07 NOTE — H&P (Signed)
History and Physical    Lori [name] VHQ:[id number] DOB: 01/12/1971 DOA: 02/06/2021  PCP: Pcp, No  Patient coming from: [place] [facility]  Chief Complaint: N/V  HPI: Lori [name] is a 51 y.o. female with medical history significant of CVA, HTN, DM2, hyperthyroidism, anxiety/depression, tachycardia. Presenting with N/V. Recently discharged from the hospital to SNF. She felt fine her first few days in SNF. She reports yesterday she tried to eat lunch and that's when her symptoms began. She start vomiting and couldn't stop. She tried zofran, but was unable to keep it down. When her vomiting continued into the evening, the SNF sent her to the ED for evaluation.   ED Course: Noted to be tachycardic w/ elevated lactic acid. UA was dirty. She was started on rocephin. TRH was called for admission.   Review of Systems: Review of systems is otherwise negative for all not mentioned in HPI.   PMHx Past Medical History:  Diagnosis Date   CAD (coronary artery disease)    Depression    Diabetes mellitus without complication ([place])    Hypertension    Pheochromocytoma    Pheochromocytoma    Stroke [facility])    Thyroid disease     PSHx Past Surgical History:  Procedure Laterality Date   CORONARY ARTERY BYPASS GRAFT     Right adrenal gland removal for pheochromocytoma Right     SocHx  reports that she has never smoked. She has never used smokeless tobacco. She reports that she does not currently use alcohol. She reports that she does not use drugs.  Allergies  Allergen Reactions   Contrast Media [Iodinated Contrast Media] Swelling   Penicillins    Plavix [Clopidogrel]    Sumatriptan     FamHx Family History  Problem Relation Age of [place]   Diabetes Mother    Diabetes Brother    Brain cancer Brother     Prior to Admission medications   Medication Sig Start Date End Date Taking? Authorizing Provider  amLODipine (NORVASC) 5 MG tablet Take 1 tablet (5 mg total) by mouth  daily. 02/02/21   Richarda Osmond, MD  aspirin 81 MG chewable tablet Chew 1 tablet (81 mg total) by mouth daily. 02/02/21   Richarda Osmond, MD  atorvastatin (LIPITOR) 80 MG tablet Take 80 mg by mouth daily.    [provider]  cyclobenzaprine (FLEXERIL) 5 MG tablet Take 5 mg by mouth 3 (three) times daily.    [provider]  escitalopram (LEXAPRO) 20 MG tablet Take 20 mg by mouth daily.    [provider]  gabapentin (NEURONTIN) 100 MG capsule Take 200 mg by mouth 3 (three) times daily.    [provider]  insulin aspart (NOVOLOG FLEXPEN) 100 UNIT/ML FlexPen Inject 0-10 Units into the skin 4 (four) times daily -  before meals and at bedtime.    [provider]  insulin detemir (LEVEMIR) 100 unit/ml SOLN Inject 50 Units into the skin daily. 02/01/21   Richarda Osmond, MD  lisinopril (ZESTRIL) 20 MG tablet Take 1 tablet (20 mg total) by mouth daily. 02/02/21   Richarda Osmond, MD  loratadine (CLARITIN) 10 MG tablet Take 1 tablet (10 mg total) by mouth daily. 02/02/21   Richarda Osmond, MD  LORazepam (ATIVAN) 0.5 MG tablet Take 0.5 tablets (0.25 mg total) by mouth at bedtime. 02/01/21   Richarda Osmond, MD  melatonin 3 MG TABS tablet Take 9 mg by mouth at bedtime.  [provider]  methimazole (TAPAZOLE) 5 MG tablet Take 1 tablet (5 mg total) by mouth daily. 02/02/21   Richarda Osmond, MD  metoCLOPramide (REGLAN) 10 MG tablet Take 1 tablet (10 mg total) by mouth 3 (three) times daily before meals. 02/01/21   Richarda Osmond, MD  metoprolol tartrate (LOPRESSOR) 100 MG tablet Take 1 tablet (100 mg total) by mouth 2 (two) times daily. 02/01/21   Richarda Osmond, MD  polyethylene glycol (MIRALAX / GLYCOLAX) 17 g packet Take 17 g by mouth daily.    [provider]  ticagrelor (BRILINTA) 90 MG TABS tablet Take 90 mg by mouth 2 (two) times daily.    [provider]  zolpidem (AMBIEN) 5 MG tablet  Take 1 tablet (5 mg total) by mouth at bedtime as needed for sleep. 02/01/21   Richarda Osmond, MD    Physical Exam: Vitals:   02/07/21 1130 02/07/21 1145 02/07/21 1200 02/07/21 1222  BP: (!) 159/128 (!) 167/114  (!) 167/114  Pulse: (!) 137 (!) 143 (!) 141 (!) 140  Resp: (!) 27 12 11  (!) 24  Temp:      TempSrc:      SpO2: 95% 95% 96% 96%    General: 51 y.o. female resting in bed in NAD Eyes: PERRL, normal sclera ENMT: Nares patent w/o discharge, orophaynx clear, dentition normal, ears w/o discharge/lesions/ulcers Neck: Supple, trachea midline Cardiovascular: tachy, +S1, S2, no m/g/r, equal pulses throughout Respiratory: decreased at bases, no w/r/r, normal WOB GI: BS+, NDNT, no masses noted, no organomegaly noted MSK: No c/c; trace ble edema Neuro: A&O x 3, contracture of LUE, dysarthria, BLE weakness Psyc: Appropriate interaction and affect, calm/cooperative  Labs on Admission: I have personally reviewed following labs and imaging studies  CBC: Recent Labs  Lab 02/07/21 0016  WBC 12.3*  NEUTROABS 9.2*  HGB 13.9  HCT 41.9  MCV 82.2  PLT 287*   Basic Metabolic Panel: Recent Labs  Lab 02/07/21 0016  NA 135  K 4.3  CL 100  CO2 23  GLUCOSE 228*  BUN 9  CREATININE 0.69  CALCIUM 9.3   GFR: CrCl cannot be calculated (Unknown ideal weight.). Liver Function Tests: Recent Labs  Lab 02/07/21 0016  AST 18  ALT 25  ALKPHOS 91  BILITOT 0.8  PROT 8.1  ALBUMIN 3.8   Recent Labs  Lab 02/07/21 0520  LIPASE 29   No results for input(s): AMMONIA in the last 168 hours. Coagulation Profile: No results for input(s): INR, PROTIME in the last 168 hours. Cardiac Enzymes: No results for input(s): CKTOTAL, CKMB, CKMBINDEX, TROPONINI in the last 168 hours. BNP (last 3 results) No results for input(s): PROBNP in the last 8760 hours. HbA1C: No results for input(s): HGBA1C in the last 72 hours. CBG: Recent Labs  Lab 02/01/21 1700 02/01/21 2049 02/02/21 0813  02/07/21 0156 02/07/21 0427  GLUCAP 96 156* 167* 260* 315*   Lipid Profile: No results for input(s): CHOL, HDL, LDLCALC, TRIG, CHOLHDL, LDLDIRECT in the last 72 hours. Thyroid Function Tests: Recent Labs    02/07/21 0929  TSH 1.017   Anemia Panel: No results for input(s): VITAMINB12, FOLATE, FERRITIN, TIBC, IRON, RETICCTPCT in the last 72 hours. Urine analysis:    Component Value Date/Time   COLORURINE YELLOW 02/07/2021 0650   APPEARANCEUR CLOUDY (A) 02/07/2021 0650   LABSPEC 1.014 02/07/2021 0650   PHURINE 7.0 02/07/2021 0650   GLUCOSEU 150 (A) 02/07/2021 0650   HGBUR SMALL (A) 02/07/2021 8676  BILIRUBINUR NEGATIVE 02/07/2021 0650   KETONESUR 5 (A) 02/07/2021 0650   PROTEINUR NEGATIVE 02/07/2021 0650   NITRITE NEGATIVE 02/07/2021 0650   LEUKOCYTESUR LARGE (A) 02/07/2021 0650    Radiological Exams on Admission: DG Chest Portable 1 View  Result Date: 02/07/2021 CLINICAL DATA:  febrile EXAM: PORTABLE CHEST 1 VIEW COMPARISON:  January 19, 2021 FINDINGS: The cardiomediastinal silhouette is unchanged in contour.Status post median sternotomy. Atherosclerotic calcifications of the aorta. No pleural effusion. No pneumothorax. No acute pleuroparenchymal abnormality. Visualized abdomen is unremarkable. Incidental note of an azygous fissure. IMPRESSION: No acute cardiopulmonary abnormality. Electronically Signed   By: Valentino Saxon M.D.   On: 02/07/2021 07:48    EKG: Independently reviewed. Sinus tach, no st elevation  Assessment/Plan Sepsis secondary to UTI Intractable N/V     - admit to inpt, progressive     - continue abx, fluids     - follow UCx, Bld Cx     - continue  reglan, PRN compazine     - check KUB  Hyperthyroidism Persistent tachycardia     - continue methimazole, metoprolol     - EKG w/ sinus tach     - likely multifactorial: hyperthyroidism, sepsis  Hx of CVA w/ paraplegia HLD CAD     - continue ASA, statin     - she was discharged a few days ago on  brilinta but comes back to the hospital on plavix; she has an allergy to plavix but its not specified; pharmacy is looking into this for [place]  HTN     - continue norvasc, metoprolol  DM2     - SSI, glucose checks, DM diet, semglee at half dose until PO intake picks up  Anxiety/depression     - continue lexapro  DVT prophylaxis: heparin  Code Status: FULL  Family Communication: None at bedside  Consults called: None   Status is: Inpatient  Remains inpatient appropriate because: severity of illness  Jonnie Finner DO [facility]  If 7PM-7AM, please contact night-coverage [email or web address]  02/07/2021, 12:58 PM

## 2021-02-07 NOTE — ED Provider Notes (Signed)
Care handoff received from [facility], PA-C. Please see their note for further information.  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 Tried Zofran without relief. No abdominal pain.  Plan: Patient found to be significantly tachycardic without clear etiology. Continues to be tachycardic after 1L fluid bolus. WBC count of 12.3, low grade temp elevation at 99.6. No vomiting since Phenergan. Respiratory panel, UA, CXR, and lactic acid pending.   COVID and flu negative, UA with signs of infection large leukocytes and many bacteria. Culture pending. Lactic 2.2.  Upon further discussion with patient, she states that she was tachycardic when she was admitted last month, TSH and free T4 were sent and appeared that patient had hyperthyroidism. She was not started on medication at that time. Therefore ordered TSH and free T4 for evaluation of potential thyroid crisis in the setting of significant tachycardia in the 140s. Also upon reevaluation patient is now vomiting again, will give IV zofran for this and reassess.  TSH and free T4 were normal. In the presence of UTI with tachycardia, tachypnea, and elevated lactic acid, decision was made to call code sepsis and initiate Rocephin with LR bolus.   Patient reevaluated and continues to have intermittent nausea and vomiting. She is still pain free. Plan to admit to medicine.  While I was awaiting hospitalist consult for admission, I was called in the room by nursing staff who was concerned that patients family member was stating that she was having new neurologic deficits. I discussed with this family member who stated that her voice was different and she was having what appeared to be left leg switching.  Considered ordering CT vs MRI imaging of brain, however after chart review I was able to locate neurology note that clearly stated  that patient had been experiencing dysarthria with left leg clonus since her admission. Several noted denoted variable reports of specifics related to dysarthria which appears to be continually improving. Discussed this with nursing staff who states they do not recognize change. She has also been on muscle relaxer and Gabapentin for the clonus in her leg and has not had it today. Therefore, I feel confident that these deficits are not new and no further imaging is indicated. Patient understanding and in agreement.   Feel that patient needs admission for urosepsis and intractable nausea and vomiting. Patient is amenable with plan.  Hospitalist called for admission.  Findings and plan of care discussed with supervising physician Dr. Kathrynn Humble who is in agreement.     .Critical Care Performed by: Bud Face, PA-C Authorized by: Bud Face, PA-C   Critical care provider statement:    Critical care time (minutes):  30   Critical care start time:  02/07/2021 9:00 PM   Critical care end time:  02/07/2021 9:30 PM   Critical care time was exclusive of:  Separately billable procedures and treating other patients   Critical care was necessary to treat or prevent imminent or life-threatening deterioration of the following conditions:  Sepsis   Critical care was time spent personally by me on the following activities:  Development of treatment plan with patient or surrogate, discussions with consultants, discussions with primary provider, evaluation of patient's response to treatment, examination of patient, ordering and review of laboratory studies, ordering and review of radiographic studies, pulse oximetry, re-evaluation of patient's condition and review of old charts   I assumed direction  of critical care for this patient from another provider in my specialty: no     Care discussed with: admitting provider       Bud Face, PA-C 02/07/21 Troy, Ankit, MD 02/07/21 [phone]

## 2021-02-07 NOTE — Progress Notes (Signed)
Pharmacy Note regarding questionable Plavix allergy:  This afternoon, I interviewed patient who states she is no longer allergic to Plavix.  Per pharmacy medication history tech, Farrel Gobble shows on patient's medication administration record from SNF that patient is currently taking Plavix with last dose administered on 02/06/2021.  Patient agreeable to me removing Plavix from her list of allergies which I then deleted.   Royetta Asal, PharmD, BCPS Clinical Pharmacist [place] Please utilize Amion for appropriate phone number to reach the unit pharmacist ([facility]) 02/07/2021 3:41 PM

## 2021-02-07 NOTE — ED Notes (Signed)
Pt had multiple episodes of green to brown liquid emesis. Provider aware

## 2021-02-07 NOTE — ED Notes (Signed)
Called [facility] to inquire if RN was ready for patient. Was told that she had just received an ED patient and RN wanted [place] to wait 15 minutes before she can read chart

## 2021-02-07 NOTE — ED Provider Notes (Signed)
[facility] DEPT Provider Note   CSN: [id number] Arrival date & time: 02/06/21  2323     History  Chief Complaint  Patient presents with   Nausea    Lori [name] is a 51 y.o. female with past medical history of type 2 diabetes, CAD status post CABG, CVA, hypertension, hyperlipidemia.  Presents to the emergency department with a chief plaint of nausea and vomiting.  States that nausea and vomiting started 3 at approximately noon after eating lunch at her nursing facility.  Patient reports that she has had persistent nausea since that time.  Patient reports vomiting 10 times in the last 24 hours.  Patient states that emesis was initially stomach contents however then became bilious.  Denies any hematemesis or coffee-ground emesis.  Denies any associated abdominal pain.    Patient denies any fever, chills, rhinorrhea, nasal congestion, sore throat, cough, abdominal distention, constipation, diarrhea, melena, blood in stool, dysuria, hematuria, urinary urgency, vaginal pain, vaginal bleeding, vaginal discharge.  HPI     Home Medications Prior to Admission medications   Medication Sig Start Date End Date Taking? Authorizing Provider  amLODipine (NORVASC) 5 MG tablet Take 1 tablet (5 mg total) by mouth daily. 02/02/21   Richarda Osmond, MD  aspirin 81 MG chewable tablet Chew 1 tablet (81 mg total) by mouth daily. 02/02/21   Richarda Osmond, MD  atorvastatin (LIPITOR) 80 MG tablet Take 80 mg by mouth daily.    [provider]  cyclobenzaprine (FLEXERIL) 5 MG tablet Take 5 mg by mouth 3 (three) times daily.    [provider]  escitalopram (LEXAPRO) 20 MG tablet Take 20 mg by mouth daily.    [provider]  gabapentin (NEURONTIN) 100 MG capsule Take 200 mg by mouth 3 (three) times daily.    [provider]  insulin aspart (NOVOLOG FLEXPEN) 100 UNIT/ML FlexPen Inject 0-10 Units into the skin 4 (four) times daily -   before meals and at bedtime.    [provider]  insulin detemir (LEVEMIR) 100 unit/ml SOLN Inject 50 Units into the skin daily. 02/01/21   Richarda Osmond, MD  lisinopril (ZESTRIL) 20 MG tablet Take 1 tablet (20 mg total) by mouth daily. 02/02/21   Richarda Osmond, MD  loratadine (CLARITIN) 10 MG tablet Take 1 tablet (10 mg total) by mouth daily. 02/02/21   Richarda Osmond, MD  LORazepam (ATIVAN) 0.5 MG tablet Take 0.5 tablets (0.25 mg total) by mouth at bedtime. 02/01/21   Richarda Osmond, MD  melatonin 3 MG TABS tablet Take 9 mg by mouth at bedtime.    [provider]  methimazole (TAPAZOLE) 5 MG tablet Take 1 tablet (5 mg total) by mouth daily. 02/02/21   Richarda Osmond, MD  metoCLOPramide (REGLAN) 10 MG tablet Take 1 tablet (10 mg total) by mouth 3 (three) times daily before meals. 02/01/21   Richarda Osmond, MD  metoprolol tartrate (LOPRESSOR) 100 MG tablet Take 1 tablet (100 mg total) by mouth 2 (two) times daily. 02/01/21   Richarda Osmond, MD  polyethylene glycol (MIRALAX / GLYCOLAX) 17 g packet Take 17 g by mouth daily.    [provider]  ticagrelor (BRILINTA) 90 MG TABS tablet Take 90 mg by mouth 2 (two) times daily.    [provider]  zolpidem (AMBIEN) 5 MG tablet Take 1 tablet (5 mg total) by mouth at bedtime as needed for sleep. 02/01/21   Richarda Osmond,  MD      Allergies    Contrast media [iodinated contrast media], Penicillins, Plavix [clopidogrel], and Sumatriptan    Review of Systems   Review of Systems  Constitutional:  Negative for chills and fever.  HENT:  Negative for congestion, rhinorrhea and sore throat.   Eyes:  Negative for visual disturbance.  Respiratory:  Negative for shortness of breath.   Cardiovascular:  Negative for chest pain.  Gastrointestinal:  Positive for nausea and vomiting. Negative for abdominal distention, abdominal pain, anal bleeding, blood in stool, constipation, diarrhea  and rectal pain.  Genitourinary:  Negative for difficulty urinating, dysuria, flank pain, frequency, genital sores, hematuria, pelvic pain, urgency, vaginal bleeding, vaginal discharge and vaginal pain.  Musculoskeletal:  Negative for back pain and neck pain.  Skin:  Negative for color change and rash.  Neurological:  Negative for dizziness, syncope, light-headedness and headaches.  Psychiatric/Behavioral:  Negative for confusion.    Physical Exam Updated Vital Signs BP (!) 132/100    Pulse (!) 144    Temp 98.3 F (36.8 C) (Axillary)    Resp 17    SpO2 94%  Physical Exam Vitals and nursing note reviewed.  Constitutional:      General: She is not in acute distress.    Appearance: She is not ill-appearing, toxic-appearing or diaphoretic.  HENT:     Head: Normocephalic.  Eyes:     General: No scleral icterus.       Right eye: No discharge.        Left eye: No discharge.  Cardiovascular:     Rate and Rhythm: Normal rate.  Pulmonary:     Effort: Pulmonary effort is normal. No tachypnea, bradypnea or respiratory distress.  Abdominal:     General: Abdomen is protuberant. Bowel sounds are normal. There is no distension. There are no signs of injury.     Palpations: Abdomen is soft. There is no mass or pulsatile mass.     Tenderness: There is no abdominal tenderness. There is no guarding or rebound.     Comments: Ecchymosis in various stages of healing noted to patient's abdomen.  Skin:    General: Skin is warm and dry.  Neurological:     General: No focal deficit present.     Mental Status: She is alert.  Psychiatric:        Behavior: Behavior is cooperative.    ED Results / Procedures / Treatments   Labs (all labs ordered are listed, but only abnormal results are displayed) Labs Reviewed  CBC WITH DIFFERENTIAL/PLATELET - Abnormal; Notable for the following components:      Result Value   WBC 12.3 (*)    RDW 16.1 (*)    Platelets 501 (*)    Neutro Abs 9.2 (*)    All other  components within normal limits  COMPREHENSIVE METABOLIC PANEL - Abnormal; Notable for the following components:   Glucose, Bld 228 (*)    All other components within normal limits  CBG MONITORING, ED - Abnormal; Notable for the following components:   Glucose-Capillary 260 (*)    All other components within normal limits  CBG MONITORING, ED - Abnormal; Notable for the following components:   Glucose-Capillary 315 (*)    All other components within normal limits  RESP PANEL BY RT-PCR (FLU A&B, COVID) ARPGX2  LIPASE, BLOOD  URINALYSIS, ROUTINE W REFLEX MICROSCOPIC  LACTIC ACID, PLASMA  LACTIC ACID, PLASMA  I-STAT BETA HCG BLOOD, ED ([facility], [facility], [facility] ONLY)    EKG  None  Radiology No results found.  Procedures Procedures    Medications Ordered in ED Medications  acetaminophen (TYLENOL) tablet 1,000 mg (has no administration in time range)  sodium chloride 0.9 % bolus 1,000 mL (1,000 mLs Intravenous New Bag/Given 02/07/21 0516)  promethazine (PHENERGAN) 12.5 mg in sodium chloride 0.9 % 50 mL IVPB (0 mg Intravenous Stopped 02/07/21 0539)  famotidine (PEPCID) IVPB 20 mg premix (0 mg Intravenous Stopped 02/07/21 0545)    ED Course/ Medical Decision Making/ A&P                           Medical Decision Making  Alert 51 year old female in no acute distress, nontoxic-appearing.  Presents to ED with chief complaint of nausea and vomiting.  Patient's care is complicated by diabetes mellitus, hypertension, CVA.  Patient reports 10 episodes of vomiting in the last 24 hours.  Describes emesis as stomach contents which had then transition to bilious.  Denies any associated abdominal pain.  On exam abdomen soft, nondistended, nontender with no guarding or rebound tenderness.  Ecchymosis to abdomen and various stages of healing.  Patient reports that she gets heparin injections.  Per chart review patient had CT abdomen pelvis without contrast performed on 01/19/2021 patient had small sliding-type  hiatal hernia, aortic arthrosclerosis, and findings consistent for vesicovaginal fistula.  CMP shows glucose elevated to 28.  Repeat CBG at 315.  Anion gap within normal limits, low suspicion for DKA at this time.  CMP shows slight leukocytosis at 12.3.  Suspect that this is reactive however due to patient's nausea and vomiting will obtain urinalysis to look for possible source of infection.  Plan to give patient 1 L fluid bolus, Phenergan, and Pepcid.  Give patient tachycardia and low-grade fever as well as leukocytosis 12.3 we will look for possible source of infection.  At time of handoff respiratory panel, urinalysis, x-ray and lactic acid pending.  Patient has not had any episodes of vomiting after receiving Phenergan.    Patient care transferred to PA Smoot at the end of my shift. Patient presentation, ED course, and plan of care discussed with review of all pertinent labs and imaging. Please see his/her note for further details regarding further ED course and disposition.         Final Clinical Impression(s) / ED Diagnoses Final diagnoses:  None    Rx / DC Orders ED Discharge Orders     None         Loni Beckwith, PA-C 02/07/21 4332    Merryl Hacker, MD 02/08/21 [phone]

## 2021-02-07 NOTE — Sepsis Progress Note (Signed)
eLink is monitoring this Code Sepsis. °

## 2021-02-08 DIAGNOSIS — A419 Sepsis, unspecified organism: Secondary | ICD-10-CM | POA: Diagnosis not present

## 2021-02-08 DIAGNOSIS — N390 Urinary tract infection, site not specified: Secondary | ICD-10-CM

## 2021-02-08 DIAGNOSIS — E119 Type 2 diabetes mellitus without complications: Secondary | ICD-10-CM

## 2021-02-08 DIAGNOSIS — R112 Nausea with vomiting, unspecified: Secondary | ICD-10-CM | POA: Diagnosis not present

## 2021-02-08 DIAGNOSIS — E0590 Thyrotoxicosis, unspecified without thyrotoxic crisis or storm: Secondary | ICD-10-CM

## 2021-02-08 DIAGNOSIS — I639 Cerebral infarction, unspecified: Secondary | ICD-10-CM

## 2021-02-08 LAB — COMPREHENSIVE METABOLIC PANEL
ALT: 20 U/L (ref 0–44)
AST: 12 U/L — ABNORMAL LOW (ref 15–41)
Albumin: 3.3 g/dL — ABNORMAL LOW (ref 3.5–5.0)
Alkaline Phosphatase: 80 U/L (ref 38–126)
Anion gap: 12 (ref 5–15)
BUN: 6 mg/dL (ref 6–20)
CO2: 25 mmol/L (ref 22–32)
Calcium: 8.8 mg/dL — ABNORMAL LOW (ref 8.9–10.3)
Chloride: 103 mmol/L (ref 98–111)
Creatinine, Ser: 0.64 mg/dL (ref 0.44–1.00)
GFR, Estimated: >60 mL/min (ref >60)
Glucose, Bld: 166 mg/dL — ABNORMAL HIGH (ref 70–99)
Potassium: 3.5 mmol/L (ref 3.5–5.1)
Sodium: 140 mmol/L (ref 135–145)
Total Bilirubin: 0.8 mg/dL (ref 0.3–1.2)
Total Protein: 7.3 g/dL (ref 6.5–8.1)

## 2021-02-08 LAB — GLUCOSE, CAPILLARY
Glucose-Capillary: 158 mg/dL — ABNORMAL HIGH (ref 70–99)
Glucose-Capillary: 139 mg/dL — ABNORMAL HIGH (ref 70–99)
Glucose-Capillary: 90 mg/dL (ref 70–99)
Glucose-Capillary: 118 mg/dL — ABNORMAL HIGH (ref 70–99)

## 2021-02-08 LAB — CBC
HCT: 34.1 % — ABNORMAL LOW (ref 36.0–46.0)
Hemoglobin: 11.6 g/dL — ABNORMAL LOW (ref 12.0–15.0)
MCH: 27.4 pg (ref 26.0–34.0)
MCHC: 34 g/dL (ref 30.0–36.0)
MCV: 80.6 fL (ref 80.0–100.0)
Platelets: 379 10*3/uL (ref 150–400)
RBC: 4.23 MIL/uL (ref 3.87–5.11)
RDW: 16.3 % — ABNORMAL HIGH (ref 11.5–15.5)
WBC: 9.5 10*3/uL (ref 4.0–10.5)
nRBC: 0 % (ref 0.0–0.2)

## 2021-02-08 LAB — PROCALCITONIN: Procalcitonin: <0.1 ng/mL

## 2021-02-08 LAB — PROTIME-INR
INR: 1 (ref 0.8–1.2)
Prothrombin Time: 13.1 seconds (ref 11.4–15.2)

## 2021-02-08 LAB — CORTISOL-AM, BLOOD: Cortisol - AM: 22.4 ug/dL (ref 6.7–22.6)

## 2021-02-08 MED ORDER — INSULIN GLARGINE-YFGN 100 UNIT/ML ~~LOC~~ SOLN
12.0000 [IU] | Freq: Every day | SUBCUTANEOUS | Status: DC
  Administered 2021-02-08: 12 [IU] via SUBCUTANEOUS
  Filled 2021-02-08 (×2): qty 0.12

## 2021-02-08 NOTE — Assessment & Plan Note (Addendum)
--   Resolved at this point

## 2021-02-08 NOTE — Assessment & Plan Note (Addendum)
--   Secondary to UTI, sepsis symptomatology resolved.  Complete antibiotics.

## 2021-02-08 NOTE — Assessment & Plan Note (Addendum)
--   Resolved, final culture data noted.

## 2021-02-08 NOTE — Assessment & Plan Note (Signed)
--  TSH within normal limits.  Continue methimazole, metoprolol

## 2021-02-08 NOTE — Progress Notes (Addendum)
°  Progress Note   Patient: Lori [name] LTR:[id number] DOB: 01-Mar-1970 DOA: 02/06/2021     1 DOS: the patient was seen and examined on 02/08/2021   Brief hospital course: 51 y.o. female with medical history significant of CVA w/ residuals, HTN, DM2, hyperthyroidism, anxiety/depression, tachycardia. Presented with N/V. Urosepsis.     Assessment and Plan1 * Sepsis ([place])- (present on admission) -- Secondary to UTI, sepsis symptomatology resolved.  Continue empiric treatment.  Acute lower UTI -- Continue empiric antibiotics, follow-up culture data.  Intractable nausea and vomiting-resolved as of 02/08/2021 --Secondary to sepsis, resolved.  Hungry.  Advance diet.   --Chart reviewed, patient most recent assessment by speech therapy dysphagia 3 diet nectar thick liquids.  Hyperthyroidism --TSH within normal limits.  Continue methimazole, metoprolol  Diabetes mellitus without complication (HCC) --Labile, continue SSI, glucose checks, DM diet, semglee at half dose until PO improved  Stroke [facility])- (present on admission) Hx of CVA w/ paraplegia, left-sided weakness, dysarthria --Continue aspirin, statin, Plavix     Subjective:  Feels better Hungry   Objective Vital signs were reviewed and unremarkable. Physical Exam Vitals reviewed.  Constitutional:      General: She is not in acute distress.    Appearance: She is not ill-appearing or toxic-appearing.  Cardiovascular:     Rate and Rhythm: Normal rate and regular rhythm.     Heart sounds: No murmur heard.    Comments: Telemetry SR Pulmonary:     Effort: Pulmonary effort is normal. No respiratory distress.     Breath sounds: No wheezing, rhonchi or rales.  Neurological:     Motor: Weakness (LUE) present.     Comments: Dysarthria noted  Psychiatric:        Mood and Affect: Mood normal.        Behavior: Behavior normal.     Data Reviewed:  CBC and BMP noted  Family Communication: none  Disposition: Status is:  Inpatient  Remains inpatient appropriate because: IV abx for sepsis, UTI, await culture         Time spent: 35 minutes  Author: Murray Hodgkins, MD 02/08/2021 7:02 PM  For on call review [email or web address].

## 2021-02-08 NOTE — Hospital Course (Addendum)
51 y.o. female with medical history significant of CVA w/ residuals, HTN, DM2, hyperthyroidism, anxiety/depression, tachycardia. Presented with N/V. Urosepsis.  Condition gradually improved with empiric antibiotics and condition now stable for discharge.  Awaiting SNF versus CIR.

## 2021-02-08 NOTE — Assessment & Plan Note (Addendum)
--   CBG overall stable, continue SSI, glucose checks, DM diet, continue semglee

## 2021-02-08 NOTE — Assessment & Plan Note (Addendum)
--  Hx of CVA w/ paraplegia, left-sided weakness, dysarthria --Continue aspirin, statin, Plavix

## 2021-02-08 NOTE — TOC Progression Note (Signed)
Transition of Care [facility]) - Progression Note    Patient Details  Name: Lori [name] MRN: [id number] Date of Birth: 16-Jul-1970  Transition of [facility]) CM/SW Contact  Purcell Mouton, RN Phone Number: 02/08/2021, 1:52 PM  Clinical Narrative:    Pt is from [facility].    Expected Discharge Plan: [place] Barriers to Discharge: No Barriers Identified  Expected Discharge Plan and Services Expected Discharge Plan: [place] arrangements for the past 2 months: [place]                                       Social Determinants of Health (SDOH) Interventions    Readmission Risk Interventions No flowsheet data found.

## 2021-02-09 DIAGNOSIS — N390 Urinary tract infection, site not specified: Secondary | ICD-10-CM | POA: Diagnosis not present

## 2021-02-09 DIAGNOSIS — E119 Type 2 diabetes mellitus without complications: Secondary | ICD-10-CM | POA: Diagnosis not present

## 2021-02-09 DIAGNOSIS — A419 Sepsis, unspecified organism: Secondary | ICD-10-CM | POA: Diagnosis not present

## 2021-02-09 DIAGNOSIS — E0590 Thyrotoxicosis, unspecified without thyrotoxic crisis or storm: Secondary | ICD-10-CM | POA: Diagnosis not present

## 2021-02-09 LAB — GLUCOSE, CAPILLARY
Glucose-Capillary: 115 mg/dL — ABNORMAL HIGH (ref 70–99)
Glucose-Capillary: 207 mg/dL — ABNORMAL HIGH (ref 70–99)
Glucose-Capillary: 133 mg/dL — ABNORMAL HIGH (ref 70–99)
Glucose-Capillary: 172 mg/dL — ABNORMAL HIGH (ref 70–99)

## 2021-02-09 MED ORDER — ALUM & MAG HYDROXIDE-SIMETH 200-200-20 MG/5ML PO SUSP
15.0000 mL | Freq: Four times a day (QID) | ORAL | Status: DC | PRN
  Administered 2021-02-09 – 2021-02-14 (×3): 15 mL via ORAL
  Filled 2021-02-09 (×3): qty 30

## 2021-02-09 MED ORDER — METOCLOPRAMIDE HCL 10 MG PO TABS
10.0000 mg | ORAL_TABLET | Freq: Three times a day (TID) | ORAL | Status: DC
  Administered 2021-02-10 – 2021-02-15 (×17): 10 mg via ORAL
  Filled 2021-02-09 (×18): qty 1

## 2021-02-09 MED ORDER — INSULIN GLARGINE-YFGN 100 UNIT/ML ~~LOC~~ SOLN
20.0000 [IU] | Freq: Every day | SUBCUTANEOUS | Status: DC
  Administered 2021-02-09 – 2021-02-14 (×6): 20 [IU] via SUBCUTANEOUS
  Filled 2021-02-09 (×7): qty 0.2

## 2021-02-09 NOTE — NC FL2 (Signed)
[place] LEVEL OF CARE SCREENING TOOL     IDENTIFICATION  Patient Name: Lori [name] Birthdate: [birth year] Sex: female Admission Date (Current Location): 02/06/2021  [facility] and [place] Number:  Herbalist and Address:  [facility],  [address] [place], [place]      Provider Number: [id number]  Attending Physician Name and Address:  Samuella Cota, MD  Relative Name and Phone Number:  [name],[name] Daughter [phone]  [phone]  [phone]    Current Level of Care: SNF Recommended Level of Care: [place] Prior Approval Number:    Date Approved/Denied:   PASRR Number: [id number]  Discharge Plan: SNF    Current Diagnoses: Patient Active Problem List   Diagnosis Date Noted   Acute lower UTI 02/08/2021   Sepsis ([place]) 02/07/2021   Constipation    Weakness    Type 2 diabetes mellitus with hyperlipidemia (HCC)    Aphasia    Spell of abnormal behavior    Stroke ([place]) 01/19/2021   Tachycardia 01/19/2021   CAD (coronary artery disease) 01/19/2021   CVA (cerebral vascular accident) ([place]) 01/19/2021   Diabetes mellitus without complication ([place])    Hyperthyroidism    Essential hypertension    Pheochromocytoma    Depression    HLD (hyperlipidemia)     Orientation RESPIRATION BLADDER Height & Weight     Self, Time, Situation, Place  Normal Incontinent Weight:   Height:     BEHAVIORAL SYMPTOMS/MOOD NEUROLOGICAL BOWEL NUTRITION STATUS     (Stroke) Incontinent Diet (Nectar thick liquid, soft diet)  AMBULATORY STATUS COMMUNICATION OF NEEDS Skin   Extensive Assist Verbally Bruising                       Personal Care Assistance Level of Assistance  Bathing, Feeding, Dressing Bathing Assistance: Maximum assistance Feeding assistance: Limited assistance Dressing Assistance: Maximum assistance     Functional Limitations Info  Sight, Hearing, Speech Sight Info:  Adequate Hearing Info: Adequate Speech Info: Adequate (Slurred/dyarthria)    SPECIAL CARE FACTORS FREQUENCY  PT (By licensed PT), OT (By licensed OT), Speech therapy     PT Frequency: Eval and Treat OT Frequency: Eval and Treat     Speech Therapy Frequency: Eval and Treat      Contractures Contractures Info: Not present    Additional Factors Info  Code Status, Allergies Code Status Info: FULL Allergies Info: Contrast Media (Iodinated Contrast Media), Penicillins, Sumatriptan Psychotropic Info: Depression         Current Medications (02/09/2021):  This is the current hospital active medication list Current Facility-Administered Medications  Medication Dose Route Frequency Provider Last Rate Last Admin   acetaminophen (TYLENOL) tablet 650 mg  650 mg Oral Q6H PRN Marylyn Ishihara, Tyrone A, DO       Or   acetaminophen (TYLENOL) suppository 650 mg  650 mg Rectal Q6H PRN Marylyn Ishihara, Tyrone A, DO       amLODipine (NORVASC) tablet 5 mg  5 mg Oral Daily Kyle, Tyrone A, DO   5 mg at 02/09/21 0913   aspirin chewable tablet 81 mg  81 mg Oral Daily Kyle, Tyrone A, DO   81 mg at 02/09/21 0912   atorvastatin (LIPITOR) tablet 80 mg  80 mg Oral Daily Kyle, Tyrone A, DO   80 mg at 02/09/21 0912   cefTRIAXone (ROCEPHIN) 2 g in sodium chloride 0.9 % 100 mL IVPB  2 g Intravenous Q24H Kyle, Tyrone A, DO 200 mL/hr  at 02/09/21 0922 2 g at 02/09/21 2440   clopidogrel (PLAVIX) tablet 75 mg  75 mg Oral Daily Kyle, Tyrone A, DO   75 mg at 02/09/21 0912   cyclobenzaprine (FLEXERIL) tablet 5 mg  5 mg Oral TID Cherylann Ratel A, DO   5 mg at 02/09/21 0912   escitalopram (LEXAPRO) tablet 20 mg  20 mg Oral Daily Kyle, Tyrone A, DO   20 mg at 02/09/21 0912   gabapentin (NEURONTIN) capsule 100 mg  100 mg Oral Daily Kyle, Tyrone A, DO   100 mg at 02/09/21 0912   heparin injection 5,000 Units  5,000 Units Subcutaneous Q8H Kyle, Tyrone A, DO   5,000 Units at 02/09/21 0540   insulin aspart (novoLOG) injection 0-15 Units  0-15 Units  Subcutaneous TID WC Kyle, Tyrone A, DO   5 Units at 02/09/21 1315   insulin aspart (novoLOG) injection 0-5 Units  0-5 Units Subcutaneous QHS Kyle, Tyrone A, DO       insulin glargine-yfgn (SEMGLEE) injection 12 Units  12 Units Subcutaneous QHS Samuella Cota, MD   12 Units at 02/08/21 2131   lisinopril (ZESTRIL) tablet 20 mg  20 mg Oral Daily Kyle, Tyrone A, DO   20 mg at 02/09/21 0913   loratadine (CLARITIN) tablet 10 mg  10 mg Oral Daily Kyle, Tyrone A, DO   10 mg at 02/09/21 0912   LORazepam (ATIVAN) tablet 0.25 mg  0.25 mg Oral QHS Kyle, Tyrone A, DO   0.25 mg at 02/08/21 2123   methimazole (TAPAZOLE) tablet 5 mg  5 mg Oral Daily Kyle, Tyrone A, DO   5 mg at 02/09/21 1027   metoCLOPramide (REGLAN) injection 10 mg  10 mg Intravenous Q8H Kyle, Tyrone A, DO   10 mg at 02/09/21 1314   metoprolol tartrate (LOPRESSOR) injection 5 mg  5 mg Intravenous Q6H PRN Marylyn Ishihara, Tyrone A, DO   5 mg at 02/08/21 0513   metoprolol tartrate (LOPRESSOR) tablet 100 mg  100 mg Oral BID Marylyn Ishihara, Tyrone A, DO   100 mg at 02/09/21 0912   polyethylene glycol (MIRALAX / GLYCOLAX) packet 17 g  17 g Oral Daily Kyle, Tyrone A, DO   17 g at 02/09/21 2536   prochlorperazine (COMPAZINE) injection 10 mg  10 mg Intravenous Q6H PRN Marylyn Ishihara, Tyrone A, DO   10 mg at 02/07/21 1605   zolpidem (AMBIEN) tablet 5 mg  5 mg Oral QHS Kyle, Tyrone A, DO   5 mg at 02/08/21 2124     Discharge Medications: Please see discharge summary for a list of discharge medications.  Relevant Imaging Results:  Relevant Lab Results:   Additional Information [phone]  Purcell Mouton, RN

## 2021-02-09 NOTE — Progress Notes (Addendum)
°  Progress Note   Patient: Lori [name] GGE:[id number] DOB: 04/27/1970 DOA: 02/06/2021     2 DOS: the patient was seen and examined on 02/09/2021   Brief hospital course: 51 y.o. female with medical history significant of CVA w/ residuals, HTN, DM2, hyperthyroidism, anxiety/depression, tachycardia. Presented with N/V. Urosepsis.   -- 1/6, much improved today, follow-up culture data likely home 1/7.    Assessment and Plan * Sepsis ([place])- (present on admission) -- Secondary to UTI, sepsis symptomatology resolved.  Continue empiric treatment.  Acute lower UTI -- Much improved, continue ceftriaxone pending final culture result.  Intractable nausea and vomiting-resolved as of 02/08/2021 --Secondary to sepsis, resolved.  Hungry.  Advance diet.   --Chart reviewed, patient most recent assessment by speech therapy dysphagia 3 diet nectar thick liquids.  Hyperthyroidism --TSH within normal limits.  Continue methimazole, metoprolol  Diabetes mellitus without complication (HCC) -- CBG better controlled today,, continue SSI, glucose checks, DM diet, will increase semglee   Stroke ([place])- (present on admission) Hx of CVA w/ paraplegia, left-sided weakness, dysarthria --Continue aspirin, statin, Plavix     Patient reports no difficulty swallowing, she was on a regular diet at facility, she only had modification to her diet last hospitalization because of swelling of her lips, speech note does indicate patient had swelling.  It appears reasonable to advance her diet.  Monitor clinically.  Subjective:  Feels much better No pain No n/v Reports was on regular diet at facility, her diet was only modified on last hospitalization because she had swelling of her lips and tongue.  Objective Vital signs were reviewed and unremarkable. Physical Exam Constitutional:      General: She is not in acute distress.    Appearance: She is not ill-appearing or toxic-appearing.  Cardiovascular:     Rate  and Rhythm: Normal rate and regular rhythm.     Heart sounds: No murmur heard. Pulmonary:     Effort: Pulmonary effort is normal. No respiratory distress.     Breath sounds: No wheezing, rhonchi or rales.  Musculoskeletal:     Right lower leg: No edema.     Left lower leg: No edema.  Neurological:     Mental Status: She is alert.     Comments: Very awake, alert and appropriate.  Speech much clearer today.  Psychiatric:        Mood and Affect: Mood normal.        Behavior: Behavior normal.     Data Reviewed:  CBG stable, CMP unremarkable Urine culture growing Proteus and additional gram-negative rod  Family Communication: none  Disposition: Status is: Inpatient  Remains inpatient appropriate because: sepsis, UTI         Time spent: 35 minutes  Author: Murray Hodgkins, MD 02/09/2021 8:56 PM  For on call review [email or web address].

## 2021-02-10 DIAGNOSIS — R112 Nausea with vomiting, unspecified: Secondary | ICD-10-CM | POA: Diagnosis not present

## 2021-02-10 DIAGNOSIS — E119 Type 2 diabetes mellitus without complications: Secondary | ICD-10-CM | POA: Diagnosis not present

## 2021-02-10 DIAGNOSIS — N390 Urinary tract infection, site not specified: Secondary | ICD-10-CM | POA: Diagnosis not present

## 2021-02-10 DIAGNOSIS — A419 Sepsis, unspecified organism: Secondary | ICD-10-CM | POA: Diagnosis not present

## 2021-02-10 LAB — GLUCOSE, CAPILLARY
Glucose-Capillary: 289 mg/dL — ABNORMAL HIGH (ref 70–99)
Glucose-Capillary: 228 mg/dL — ABNORMAL HIGH (ref 70–99)
Glucose-Capillary: 222 mg/dL — ABNORMAL HIGH (ref 70–99)
Glucose-Capillary: 269 mg/dL — ABNORMAL HIGH (ref 70–99)

## 2021-02-10 MED ORDER — CALCIUM CARBONATE ANTACID 500 MG PO CHEW
1.0000 | CHEWABLE_TABLET | Freq: Three times a day (TID) | ORAL | Status: DC | PRN
  Administered 2021-02-10 – 2021-02-15 (×2): 200 mg via ORAL
  Filled 2021-02-10 (×3): qty 1

## 2021-02-10 MED ORDER — FAMOTIDINE IN NACL 20-0.9 MG/50ML-% IV SOLN
20.0000 mg | Freq: Once | INTRAVENOUS | Status: AC
  Administered 2021-02-10: 20 mg via INTRAVENOUS
  Filled 2021-02-10: qty 50

## 2021-02-10 MED ORDER — PANTOPRAZOLE SODIUM 40 MG PO TBEC
40.0000 mg | DELAYED_RELEASE_TABLET | Freq: Every day | ORAL | Status: DC
  Administered 2021-02-10 – 2021-02-15 (×6): 40 mg via ORAL
  Filled 2021-02-10 (×6): qty 1

## 2021-02-10 MED ORDER — BISACODYL 5 MG PO TBEC
5.0000 mg | DELAYED_RELEASE_TABLET | Freq: Every day | ORAL | Status: DC | PRN
  Administered 2021-02-10 – 2021-02-13 (×3): 5 mg via ORAL
  Filled 2021-02-10 (×4): qty 1

## 2021-02-10 MED ORDER — ONDANSETRON HCL 4 MG/2ML IJ SOLN
4.0000 mg | Freq: Four times a day (QID) | INTRAMUSCULAR | Status: DC | PRN
  Administered 2021-02-10 – 2021-02-14 (×3): 4 mg via INTRAVENOUS
  Filled 2021-02-10 (×3): qty 2

## 2021-02-10 MED ORDER — ALPRAZOLAM 0.5 MG PO TABS: 0.5000 mg | ORAL_TABLET | Freq: Every evening | ORAL | Status: DC | PRN

## 2021-02-10 NOTE — Plan of Care (Signed)
  Problem: Education: Goal: Knowledge of General Education information will improve Description Including pain rating scale, medication(s)/side effects and non-pharmacologic comfort measures Outcome: Progressing   

## 2021-02-10 NOTE — Progress Notes (Addendum)
Patient noted with an episode of greenish watery emesis while sitting on the edge of the bed. PRN admin. Will continue to monitor.    Update of PRN med: effective, pt shares she feel better w/ medication.

## 2021-02-10 NOTE — Progress Notes (Addendum)
Patient ID: Lori [name], female   DOB: 16-Jul-1970, 51 y.o.   MRN: [id number] Patient still reports reflux and heartburn despite receiving Maalox, so I will add as needed Tums, and start her on Protonix. Phillips Climes MD   Update 1:00 am : -Patient did have vomiting x1, she did vomit  her Tums and Protonix, will order as needed Zofran, last BM was on Monday, patient reports she won't  to be able to tolerate MiraLAX, so we will start on as needed Dulcolax suppository. Phillips Climes MD

## 2021-02-10 NOTE — Plan of Care (Signed)
°  Problem: Education: Goal: Knowledge of General Education information will improve Description: Including pain rating scale, medication(s)/side effects and non-pharmacologic comfort measures 02/10/2021 1441 by Sanjuana Mae, RN Outcome: Progressing 02/10/2021 1356 by Sanjuana Mae, RN Outcome: Progressing

## 2021-02-10 NOTE — Progress Notes (Signed)
°  Progress Note   Patient: Lori [name] GUY:[id number] DOB: 1970-11-28 DOA: 02/06/2021     3 DOS: the patient was seen and examined on 02/10/2021   Brief hospital course: 51 y.o. female with medical history significant of CVA w/ residuals, HTN, DM2, hyperthyroidism, anxiety/depression, tachycardia. Presented with N/V. Urosepsis.   -- 1/7 vomited overnight, nauseous.  Did not eat breakfast or lunch.  Monitor.  Antiemetics as needed.  Tolerating diet if unable to eat.    Assessment and Plan * Sepsis ([place])- (present on admission) -- Secondary to UTI, sepsis symptomatology resolved.  Continue empiric treatment.  Acute lower UTI -- Continues to improve, continue empiric ceftriaxone pending culture results  Intractable nausea and vomiting-resolved as of 02/08/2021 -- Recurrent nausea and vomiting overnight, unclear etiology.  Exam benign.  Monitor.  Supportive care.   Hyperthyroidism --TSH within normal limits.  Continue methimazole, metoprolol  Diabetes mellitus without complication (HCC) -- CBG overall stable, continue SSI, glucose checks, DM diet, continue semglee   Stroke [facility])- (present on admission) --Hx of CVA w/ paraplegia, left-sided weakness, dysarthria --Continue aspirin, statin, Plavix     Subjective:  Vomited overnight, no appetite, has not eaten breakfast or lunch.  Objective Vital signs were reviewed and unremarkable. Physical Exam Vitals reviewed.  Constitutional:      General: She is not in acute distress.    Appearance: She is ill-appearing. She is not toxic-appearing.  Cardiovascular:     Rate and Rhythm: Normal rate and regular rhythm.     Heart sounds: No murmur heard. Pulmonary:     Effort: Pulmonary effort is normal. No respiratory distress.     Breath sounds: No wheezing, rhonchi or rales.  Abdominal:     General: There is no distension.     Palpations: There is no mass.     Tenderness: There is no abdominal tenderness.  Neurological:      Mental Status: She is alert.  Psychiatric:        Mood and Affect: Mood normal.        Behavior: Behavior normal.     Data Reviewed:  CBG stable UC Ecoli and Klebsiella pneumoniae  Family Communication: husband and uncle at bedside  Disposition: Status is: Inpatient  Remains inpatient appropriate because: nausea, vomiting, anorexia         Time spent: 25 minutes  Author: Murray Hodgkins, MD 02/10/2021 6:00 PM  For on call review [email or web address]

## 2021-02-11 ENCOUNTER — Other Ambulatory Visit: Payer: Self-pay

## 2021-02-11 DIAGNOSIS — E119 Type 2 diabetes mellitus without complications: Secondary | ICD-10-CM | POA: Diagnosis not present

## 2021-02-11 DIAGNOSIS — N390 Urinary tract infection, site not specified: Secondary | ICD-10-CM | POA: Diagnosis not present

## 2021-02-11 DIAGNOSIS — R112 Nausea with vomiting, unspecified: Secondary | ICD-10-CM | POA: Diagnosis not present

## 2021-02-11 DIAGNOSIS — A419 Sepsis, unspecified organism: Secondary | ICD-10-CM | POA: Diagnosis not present

## 2021-02-11 LAB — RESP PANEL BY RT-PCR (FLU A&B, COVID) ARPGX2
Influenza A by PCR: NEGATIVE
Influenza B by PCR: NEGATIVE
SARS Coronavirus 2 by RT PCR: NEGATIVE

## 2021-02-11 LAB — URINE CULTURE: Culture: >=100000 — AB

## 2021-02-11 LAB — GLUCOSE, CAPILLARY
Glucose-Capillary: 177 mg/dL — ABNORMAL HIGH (ref 70–99)
Glucose-Capillary: 216 mg/dL — ABNORMAL HIGH (ref 70–99)
Glucose-Capillary: 146 mg/dL — ABNORMAL HIGH (ref 70–99)
Glucose-Capillary: 112 mg/dL — ABNORMAL HIGH (ref 70–99)

## 2021-02-11 MED ORDER — CEFUROXIME AXETIL 500 MG PO TABS: 500.0000 mg | ORAL_TABLET | Freq: Two times a day (BID) | ORAL | 0 refills | Status: AC

## 2021-02-11 MED ORDER — SODIUM CHLORIDE 0.9 % IV SOLN: INTRAVENOUS | Status: AC

## 2021-02-11 MED ORDER — CYCLOBENZAPRINE HCL 5 MG PO TABS: 5.0000 mg | ORAL_TABLET | Freq: Three times a day (TID) | ORAL | 0 refills | Status: DC

## 2021-02-11 MED ORDER — CEFUROXIME AXETIL 500 MG PO TABS: 500.0000 mg | ORAL_TABLET | Freq: Two times a day (BID) | ORAL | 0 refills | Status: DC

## 2021-02-11 MED ORDER — PANTOPRAZOLE SODIUM 40 MG PO TBEC: 40.0000 mg | DELAYED_RELEASE_TABLET | Freq: Every day | ORAL | Status: DC

## 2021-02-11 MED ORDER — LORAZEPAM 0.5 MG PO TABS: 0.2500 mg | ORAL_TABLET | Freq: Every day | ORAL | 0 refills | Status: DC

## 2021-02-11 MED ORDER — CALCIUM CARBONATE ANTACID 500 MG PO CHEW: 1.0000 | CHEWABLE_TABLET | Freq: Every day | ORAL | Status: DC

## 2021-02-11 MED ORDER — GABAPENTIN 100 MG PO CAPS
100.0000 mg | ORAL_CAPSULE | Freq: Three times a day (TID) | ORAL | Status: DC
  Administered 2021-02-11 – 2021-02-15 (×14): 100 mg via ORAL
  Filled 2021-02-11 (×14): qty 1

## 2021-02-11 MED ORDER — INSULIN GLARGINE 100 UNIT/ML ~~LOC~~ SOLN: 25.0000 [IU] | Freq: Every day | SUBCUTANEOUS | Status: DC

## 2021-02-11 NOTE — Discharge Summary (Incomplete)
Physician Discharge Summary   Patient: Lori [name] MRN: [id number] DOB: [birth year]  Admit date:     02/06/2021  Discharge date: {dischdate:26783}  Discharge Physician: Murray Hodgkins   PCP: Pcp, No   Recommendations at discharge:  {Tip this will not be part of the note when signed- Example include specific recommendations for outpatient follow-up, pending tests to follow-up on. (Optional):26781} Follow-up resolution of sepsis, UTI, urine culture  Discharge Diagnoses Principal Problem:   Sepsis ([place]) Active Problems:   Acute lower UTI   Stroke ([place])   Diabetes mellitus without complication ([place])   Hyperthyroidism   HLD (hyperlipidemia)  Resolved Problems:   Intractable nausea and vomiting   Hospital Course   51 y.o. female with medical history significant of CVA w/ residuals, HTN, DM2, hyperthyroidism, anxiety/depression, tachycardia. Presented with N/V. Urosepsis.   -- 1/7 vomited overnight, nauseous.  Did not eat breakfast or lunch.  Monitor.  Antiemetics as needed.  Tolerating diet if unable to eat.    * Sepsis ([place])- (present on admission) -- Secondary to UTI, sepsis symptomatology resolved.  Continue empiric treatment.  Acute lower UTI -- Continues to improve, continue empiric ceftriaxone pending culture results  Intractable nausea and vomiting-resolved as of 02/08/2021 -- Recurrent nausea and vomiting overnight, unclear etiology.  Exam benign.  Monitor.  Supportive care.   Hyperthyroidism --TSH within normal limits.  Continue methimazole, metoprolol  Diabetes mellitus without complication (HCC) -- CBG overall stable, continue SSI, glucose checks, DM diet, continue semglee   Stroke ([place])- (present on admission) --Hx of CVA w/ paraplegia, left-sided weakness, dysarthria --Continue aspirin, statin, Plavix    {Tip this will not be part of the note when signed There is no height or weight on file to calculate BMI. ,  ,     (Optional):26781}  {(NOTE)  Pain control PDMP Statment (Optional):26782}  Consultants: *** Procedures performed: ***  Disposition: {Plan; Disposition:26390} Diet recommendation: {Diet_Plan:26776}  DISCHARGE MEDICATION: Allergies as of 02/11/2021       Reactions   Contrast Media [iodinated Contrast Media] Swelling   Penicillins Swelling   Mouth swells up and eyes swollen shut   Sumatriptan      Med Rec must be completed prior to using this [facility]***        Discharge Exam: There were no vitals filed for this visit. ***  Condition at discharge: {DC Condition:26389}  The results of significant diagnostics from this hospitalization (including imaging, microbiology, ancillary and laboratory) are listed below for reference.   Imaging Studies: CT ABDOMEN PELVIS WO CONTRAST  Result Date: 01/19/2021 CLINICAL DATA:  Nausea, vomiting, abdominal pain. EXAM: CT ABDOMEN AND PELVIS WITHOUT CONTRAST TECHNIQUE: Multidetector CT imaging of the abdomen and pelvis was performed following the standard protocol without IV contrast. COMPARISON:  None. FINDINGS: Lower chest: Small sliding-type hiatal hernia. Visualized lung bases are unremarkable. Hepatobiliary: No focal liver abnormality is seen. No gallstones, gallbladder wall thickening, or biliary dilatation. Pancreas: Unremarkable. No pancreatic ductal dilatation or surrounding inflammatory changes. Spleen: Normal in size without focal abnormality. Adrenals/Urinary Tract: Adrenal glands are unremarkable. Kidneys are normal, without renal calculi, focal lesion, or hydronephrosis. Bladder is unremarkable. Stomach/Bowel: Stomach is within normal limits. Appendix appears normal. No evidence of bowel wall thickening, distention, or inflammatory changes. Vascular/Lymphatic: Aortic atherosclerosis. No enlarged abdominal or pelvic lymph nodes. Reproductive: Patient appears to be status post hysterectomy. No definite adnexal abnormality is noted. 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 Other: No abdominal wall hernia or  abnormality. No abdominopelvic ascites. Musculoskeletal: No acute or significant osseous findings. IMPRESSION: Patient appears to be status post hysterectomy. 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 Small sliding-type hiatal hernia. Aortic Atherosclerosis (ICD10-I70.0). Electronically Signed   By: Marijo Conception M.D.   On: 01/19/2021 13:21   DG Abd 1 View  Result Date: 02/07/2021 CLINICAL DATA:  Intractable nausea and vomiting. EXAM: ABDOMEN - 1 VIEW COMPARISON:  01/24/2021 FINDINGS: A small amount of residual oral contrast material is scattered throughout the colon and rectum, much less than on the prior radiographs. Colonic diverticulosis is again noted. No dilated loops of bowel are seen to suggest obstruction. Right upper quadrant abdominal surgical clips and sternal wires are noted. No acute osseous abnormality is seen. IMPRESSION: No evidence of bowel obstruction. Electronically Signed   By: Logan Bores M.D.   On: 02/07/2021 16:42   CT HEAD WO CONTRAST (5MM)  Result Date: 01/24/2021 CLINICAL DATA:  Stroke, follow up nausea vomiting EXAM: CT HEAD WITHOUT CONTRAST TECHNIQUE: Contiguous axial images were obtained from the base of the skull through the vertex without intravenous contrast. COMPARISON:  MRI head 01/20/2021 FINDINGS: Brain: Redemonstration of chronic bilateral basal ganglia, left thalami, and bilateral cerebellar infarctions. No evidence of large-territorial acute infarction. No parenchymal hemorrhage. No mass lesion. No extra-axial collection. No mass effect or midline shift. No hydrocephalus. Basilar cisterns are patent. Vascular: No hyperdense vessel. Atherosclerotic calcifications are present within the cavernous internal carotid and vertebral arteries. Skull: No acute fracture or focal lesion. Sinuses/Orbits: Paranasal sinuses and mastoid  air cells are clear. The orbits are unremarkable. Other: None. IMPRESSION: No acute intracranial abnormality in a patient with multiple prior infarctions. Electronically Signed   By: Iven Finn M.D.   On: 01/24/2021 16:20   CT SOFT TISSUE NECK WO CONTRAST  Result Date: 01/21/2021 CLINICAL DATA:  Soft tissue swelling, infection suspected EXAM: CT NECK WITHOUT CONTRAST TECHNIQUE: Multidetector CT imaging of the neck was performed following the standard protocol without intravenous contrast. COMPARISON:  CTA head neck 01/19/2021 FINDINGS: Pharynx and larynx: The nasal cavity and nasopharynx are normal. The oropharynx and oral cavity are normal. The hypopharynx and larynx are normal. There is no abnormal soft tissue mass or fluid collection, within the confines of noncontrast technique. The parapharyngeal spaces are clear. The airway is patent. Salivary glands: Parotid and submandibular glands are normal. Thyroid: Unremarkable. Lymph nodes: There is no pathologic lymphadenopathy in the neck. Vascular: There is calcified atherosclerotic plaque in the bilateral carotid bulbs. Limited intracranial: The imaged portions of the intracranial compartment are unremarkable. Visualized orbits: The globes and orbits are unremarkable. Mastoids and visualized paranasal sinuses: Paranasal sinuses and mastoid air cells are clear. Skeleton: There is no significant degenerative change of the cervical spine. There is no acute osseous abnormality or aggressive osseous lesion. There is no visible canal hematoma Upper chest: . the imaged lung apices are clear. Other: None. IMPRESSION: Unremarkable noncontrast CT of the neck with no acute finding to explain the patient's symptoms. Electronically Signed   By: Valetta Mole M.D.   On: 01/21/2021 16:38   MR BRAIN WO CONTRAST  Result Date: 01/20/2021 CLINICAL DATA:  Left-sided weakness, stroke suspected EXAM: MRI HEAD WITHOUT CONTRAST TECHNIQUE: Multiplanar, multiecho pulse  sequences of the brain and surrounding structures were obtained without intravenous contrast. COMPARISON:  CT/CTA head and neck dated 1 day prior FINDINGS: Brain: There is no evidence of acute intracranial hemorrhage, extra-axial fluid collection, or acute infarct. There  is a background of moderate parenchymal volume loss, advanced for age. There are remote infarcts in the bilateral basal ganglia, thalami, right cerebral peduncle, and periventricular white matter. There is a remote infarct in the left cerebellar hemisphere. Additional confluent FLAIR signal abnormality in the subcortical and periventricular white matter likely reflects sequela of chronic white matter microangiopathy, also advanced for age. There are multiple small chronic microhemorrhages in a central distribution, likely hypertensive in etiology. There is no solid mass lesion.  There is no midline shift. Vascular: Normal flow voids. Skull and upper cervical spine: Normal marrow signal. Sinuses/Orbits: The paranasal sinuses are clear. The globes and orbits are unremarkable. Other: None. IMPRESSION: 1. No acute intracranial pathology. 2. Moderate parenchymal volume loss and chronic white matter microangiopathy, advanced for age. 3. 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 4. Multiple small chronic microhemorrhages in a central distribution, likely hypertensive in etiology. Electronically Signed   By: Valetta Mole M.D.   On: 01/20/2021 14:31   MR THORACIC SPINE WO CONTRAST  Result Date: 01/21/2021 CLINICAL DATA:  Demyelinating disease EXAM: MRI THORACIC SPINE WITHOUT CONTRAST TECHNIQUE: Multiplanar, multisequence MR imaging of the thoracic spine was performed. No intravenous contrast was administered. COMPARISON:  None. FINDINGS: Evaluation is somewhat limited by motion artifact. Alignment:  Physiologic. Vertebrae: No fracture, evidence of discitis, or bone lesion.  Cord:  Normal signal and morphology. Paraspinal and other soft tissues: Small right pleural effusion. Disc levels: T6-T7: Small central disc protrusion. No spinal canal stenosis or neural foraminal narrowing. T11-T12: Small left paracentral disc protrusion. No spinal canal stenosis or neural foraminal narrowing. Other thoracic vertebral levels are negative. IMPRESSION: 1. No evidence of demyelinating disease in the thoracic spinal cord. 2. No spinal canal stenosis or neural foraminal narrowing. Electronically Signed   By: Merilyn Baba M.D.   On: 01/21/2021 22:22   DG Chest Portable 1 View  Result Date: 02/07/2021 CLINICAL DATA:  febrile EXAM: PORTABLE CHEST 1 VIEW COMPARISON:  January 19, 2021 FINDINGS: The cardiomediastinal silhouette is unchanged in contour.Status post median sternotomy. Atherosclerotic calcifications of the aorta. No pleural effusion. No pneumothorax. No acute pleuroparenchymal abnormality. Visualized abdomen is unremarkable. Incidental note of an azygous fissure. IMPRESSION: No acute cardiopulmonary abnormality. Electronically Signed   By: Valentino Saxon M.D.   On: 02/07/2021 07:48   DG Chest Port 1 View  Result Date: 01/19/2021 CLINICAL DATA:  Sepsis. EXAM: PORTABLE CHEST 1 VIEW COMPARISON:  None. FINDINGS: The heart size and mediastinal contours are within normal limits. Both lungs are clear. Sternotomy wires are noted. The visualized skeletal structures are unremarkable. IMPRESSION: No active disease. Electronically Signed   By: Marijo Conception M.D.   On: 01/19/2021 09:18   DG Abd 2 Views  Result Date: 01/24/2021 CLINICAL DATA:  Nausea and vomiting EXAM: ABDOMEN - 2 VIEW COMPARISON:  CT abdomen pelvis 01/19/2021 FINDINGS: The bowel gas pattern is normal. There is no evidence of free air. PO contrast reaches the sigmoid colon. No extravasation of the PO contrast noted within the intraperitoneal space. Colonic diverticulosis. No radio-opaque calculi or other significant  radiographic abnormality is seen. Vascular calcification. IMPRESSION: 1. PO contrast reaches the sigmoid colon. 2. Colonic diverticulosis. 3.  Aortic Atherosclerosis (ICD10-I70.0). Electronically Signed   By: Iven Finn M.D.   On: 01/24/2021 16:51   DG Swallowing Func-Speech Pathology  Result Date: 01/31/2021 Table formatting from the original result was not included. SLP Summary Report below; please see Notes for full  report. Assessment / Plan / Recommendation   Clinical Impressions 01/31/2021 Clinical Impression Patient continues with moderate oropharyngeal dysphagia without changes in deficits of swallow timing, sensation, or airway protection as seen on MBS 01/23/21. Pt reports feeling of "throat swelling" has subsided, and lip swelling has reduced significantly. RN reports pt has been non-compliant with liquid recommendations; has been refusing pills unless given whole (vs crushed) in puree, and eating [place] ices despite education on liquid precautions. Pt reports onset of left-sided chest pain when coughing. Per review of vitals, has been afebrile. Hoarse vocal quality has improved, however pt with ongoing hypophonia as well as slight hypernasal quality to resonance. Along with impaired timing of swallow initiation and impaired laryngeal sensation, this may indicate CN X impairments. ENT consult may assist in further assessment of laryngeal function pertaining to both swallow and voicing. Oral stage of swallowing is characterized by adequate bolus hold but slow, disorganized lingual transport. Mastication with solids occurs with munching pattern (anterior) vs rotary chewing given absent dentition. Swallow initiation is delayed to the level of the pyriform sinuses with thin liquids. There is deep laryngeal penetration during the swallow with thin liquids, with silent aspiration post-swallow (one instance of delayed sensation). Cued cough: patient was unable to follow the command consistently and was  unable to clear the airway when she did follow this command. Airway protection is improved with nectar thick liquids (one instance of trace shallow penetration which cleared with throat clear, subsequent swallow). Compensatory maneuvers attempted with thins included bolus hold, chin tuck, and supraglottic swallow, however these did not prevent aspiration with thin liquids. Given pt non-compliance with pills crushed, assessed with 13 mm barium tablet whole in puree. Pt able to clear this without difficulty. Does appear to have slightly improved base of tongue retraction, hyolaryngeal excursion, as there was minimal pharyngeal residue post-swallow (mild vallecular after puree, solid). Noted with slowed clearance through the cervical esophagus with intermittent retrograde flow with al consistencies, which remained below the level of the pharyngoesophageal segment. After the study pt was educated regarding findings and recommendation for continuing dysphagia 3, nectar liquids given acute illness, decreased functional reserve, and risk for aspiration. Patient was at times verbalized resistance, intent for non-compliance. Encouraged pt to follow recommendations at least as a temporary measure until medical status improves and she is able to begin working on rehabilitation. Educated provided pt that SLP simply makes recommendation, and that she may choose not to follow recommendations however this increases risk for aspiration, aspiration pneumonia, increased length of stay, and worsening respiratory status. Patient verbalized understanding and expressed desire to comply with recommendations. Nsg updated via secure chat.  SLP Visit Diagnosis Dysphagia, oropharyngeal phase (R13.12) Impact on safety and function Moderate aspiration risk;Risk for inadequate nutrition/hydration Deneise Lever, MS, CCC-SLP Speech-Language Pathologist    EEG adult  Result Date: 01/22/2021 Derek Jack, MD     01/22/2021  9:15 PM  Routine EEG Report Taquita Demby [name] is a 51 y.o. female with a history of strokes and aphasia who is undergoing an EEG to evaluate for seizures. Report: This EEG was acquired with electrodes placed according to the International 10-20 electrode system (including Fp1, Fp2, F3, F4, C3, C4, P3, P4, O1, O2, T3, T4, T5, T6, A1, A2, Fz, Cz, Pz). The following electrodes were missing or displaced: none. The occipital dominant rhythm was 7-8 Hz. This activity is reactive to stimulation. Drowsiness was manifested by background fragmentation; deeper stages of sleep were identified by K complexes and sleep  spindles. There was subtle focal slowing over the left frontal region. There were no interictal epileptiform discharges. There were no electrographic seizures identified. There was no abnormal response to photic stimulation. Hyperventilation was not performed. Impression and clinical correlation: This EEG was obtained while awake and asleep and is abnormal due to mild diffuse slowing indicative of global cerebral dysfunction. Subtle left frontal focal slowing indicated superimposed focal dysfunction in that area. Epileptiform abnormalities were not seen during this recording. Su Monks, MD [facility] [phone] If 7pm- 7am, please page neurology on call as listed in Rew.   ECHOCARDIOGRAM COMPLETE  Result Date: 01/21/2021    ECHOCARDIOGRAM REPORT   Patient Name:   SHERON TALLMAN Date of Exam: 01/21/2021 Medical Rec #:  [id number]           Height:       62.0 in Accession #:    [id number]          Weight:       215.4 lb Date of Birth:  09-24-1970            BSA:          1.973 m Patient Age:    15 years            BP:           124/89 mmHg Patient Gender: F                   HR:           107 bpm. Exam Location:  [facility] Procedure: 2D Echo Indications:     Stroke I63.9  History:         Patient has no prior history of Echocardiogram examinations.  Sonographer:     Kathlen Brunswick RDCS Referring  Phys:  [id number] Soledad Gerlach NIU Diagnosing Phys: Ida Rogue MD  Sonographer Comments: Technically challenging study due to limited acoustic windows, suboptimal subcostal window and suboptimal apical window. Image acquisition challenging due to patient body habitus. IMPRESSIONS  1. Left ventricular ejection fraction, by estimation, is 60 to 65%. The left ventricle has normal function. The left ventricle has no regional wall motion abnormalities. There is moderate left ventricular hypertrophy. Left ventricular diastolic parameters are consistent with Grade I diastolic dysfunction (impaired relaxation).  2. Right ventricular systolic function is normal. The right ventricular size is normal.  3. The mitral valve is normal in structure. No evidence of mitral valve regurgitation. No evidence of mitral stenosis.  4. Tricuspid valve regurgitation is mild to moderate.  5. The aortic valve is normal in structure. Aortic valve regurgitation is not visualized. Aortic valve sclerosis is present, with no evidence of aortic valve stenosis.  6. The inferior vena cava is normal in size with greater than 50% respiratory variability, suggesting right atrial pressure of 3 mmHg. FINDINGS  Left Ventricle: Left ventricular ejection fraction, by estimation, is 60 to 65%. The left ventricle has normal function. The left ventricle has no regional wall motion abnormalities. The left ventricular internal cavity size was normal in size. There is  moderate left ventricular hypertrophy. Left ventricular diastolic parameters are consistent with Grade I diastolic dysfunction (impaired relaxation). Right Ventricle: The right ventricular size is normal. No increase in right ventricular wall thickness. Right ventricular systolic function is normal. Left Atrium: Left atrial size was normal in size. Right Atrium: Right atrial size was normal in size. Pericardium: There is no evidence of pericardial effusion. Mitral Valve: The mitral valve is normal in  structure.  No evidence of mitral valve regurgitation. No evidence of mitral valve stenosis. Tricuspid Valve: The tricuspid valve is normal in structure. Tricuspid valve regurgitation is mild to moderate. No evidence of tricuspid stenosis. Aortic Valve: The aortic valve is normal in structure. Aortic valve regurgitation is not visualized. Aortic valve sclerosis is present, with no evidence of aortic valve stenosis. Aortic valve peak gradient measures 8.0 mmHg. Pulmonic Valve: The pulmonic valve was normal in structure. Pulmonic valve regurgitation is not visualized. No evidence of pulmonic stenosis. Aorta: The aortic root is normal in size and structure. Venous: The inferior vena cava is normal in size with greater than 50% respiratory variability, suggesting right atrial pressure of 3 mmHg. IAS/Shunts: No atrial level shunt detected by color flow Doppler.  LEFT VENTRICLE PLAX 2D LVIDd:         3.00 cm   Diastology LVIDs:         2.10 cm   LV e' medial:    6.20 cm/s LV PW:         1.50 cm   LV E/e' medial:  14.3 LV IVS:        1.30 cm   LV e' lateral:   5.98 cm/s LVOT diam:     1.80 cm   LV E/e' lateral: 14.8 LV SV:         42 LV SV Index:   22 LVOT Area:     2.54 cm  LEFT ATRIUM         Index LA diam:    3.10 cm 1.57 cm/m  AORTIC VALVE                 PULMONIC VALVE AV Area (Vmax): 1.53 cm     PV Vmax:       1.18 m/s AV Vmax:        141.00 cm/s  PV Peak grad:  5.6 mmHg AV Peak Grad:   8.0 mmHg LVOT Vmax:      84.90 cm/s LVOT Vmean:     60.700 cm/s LVOT VTI:       0.167 m  AORTA Ao Root diam: 2.50 cm Ao Asc diam:  2.30 cm MITRAL VALVE                TRICUSPID VALVE MV Area (PHT): 6.43 cm     TV Peak grad:   19.2 mmHg MV Decel Time: 118 msec     TV Vmax:        2.19 m/s MV E velocity: 88.70 cm/s MV A velocity: 105.00 cm/s  SHUNTS MV E/A ratio:  0.84         Systemic VTI:  0.17 m                             Systemic Diam: 1.80 cm Ida Rogue MD Electronically signed by Ida Rogue MD Signature Date/Time:  01/21/2021/7:03:01 PM    Final    CT HEAD CODE STROKE WO CONTRAST  Result Date: 01/19/2021 CLINICAL DATA:  Code stroke. Generalized weakness. Aphasia and left-sided deficits from prior stroke EXAM: CT HEAD WITHOUT CONTRAST TECHNIQUE: Contiguous axial images were obtained from the base of the skull through the vertex without intravenous contrast. COMPARISON:  None. FINDINGS: Brain: Confluent low-density in the cerebral white matter with superimposed chronic lacunar infarcts at the deep gray nuclei and deep white matter tracks. 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 Premature brain atrophy. No acute hemorrhage,  hydrocephalus, collection, or masslike finding. Brain atrophy especially affecting the cerebellum. Vascular: No hyperdense vessel. Premature atheromatous calcification. Skull: Normal. Negative for fracture or focal lesion. Sinuses/Orbits: No acute finding. Other: These results were called by telephone at the time of interpretation on 01/19/2021 at 7:59 am to provider Merlyn Lot , who verbally acknowledged these results. ASPECTS [facility] Stroke Program Early CT Score) No acute infarct. IMPRESSION: 1. No acute finding. 2. 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 Electronically Signed   By: Jorje Guild M.D.   On: 01/19/2021 08:01   CT ANGIO HEAD CODE STROKE  Result Date: 01/19/2021 CLINICAL DATA:  Generalized weakness with aphasia. Left-sided deficits. Vomiting. EXAM: CT ANGIOGRAPHY HEAD AND NECK TECHNIQUE: Multidetector CT imaging of the head and neck was performed using the standard protocol during bolus administration of intravenous contrast. Multiplanar CT image reconstructions and MIPs were obtained to evaluate the vascular anatomy. Carotid stenosis measurements (when applicable) are obtained utilizing NASCET criteria, using the distal internal carotid diameter as the denominator. CONTRAST:  73mL OMNIPAQUE IOHEXOL 350 MG/ML SOLN COMPARISON:  None.  FINDINGS: CTA NECK FINDINGS Aortic arch: Atheromatous plaque. 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 No dissection or ulceration. Left carotid system: Age advanced atheromatous plaque along the common carotid and proximal ICA without flow limiting stenosis or ulceration. Vertebral arteries: Aberrant right subclavian artery. Significant for age atheromatous plaque at the bilateral proximal subclavian, but without flow limiting stenosis. Heavily diseased vertebral arteries with extensive calcific plaque and multifocal narrowing. The origins are difficult to assess due to small vessel size, calcified plaque, and body habitus. Both vertebral arteries are patent at the dura although there is subsequent occlusion on the left. Skeleton: No acute finding Other neck: No acute finding Upper chest: No acute finding Review of the MIP images confirms the above findings CTA HEAD FINDINGS Anterior circulation: Heavily diseased carotid siphon with calcified plaque and multifocal luminal stenosis. The degree of calcified plaque limits lumen measurement, expect flow limiting stenosis on both sides. 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 Extensive atheromatous plaque affecting the M1 segments and MCA branches. Moderate narrowing at the bilateral M1 segment. Negative for aneurysm. Posterior circulation: Heavily diseased vertebral arteries with calcified plaque. Severe right vertebral stenosis beyond the PICA. Left proximal V4 segment occlusion with potentially retrograde flow seen at the proximal left PICA. Narrow basilar diffusely with 2 segments of non enhancement. Fetal type bilateral PCA flow. Venous sinuses: No emergent finding Anatomic variants: As above Review of the MIP images confirms the above findings  Critical Value/emergent results were called by telephone at the time of interpretation on 01/19/2021 at 8:42 am to provider ERIC [facility] , who verbally acknowledged these results. IMPRESSION: 1. Generalized and severe atherosclerosis, especially for age. 2. Dominant findings in the posterior circulation where there are left vertebral and basilar occlusions which are age indeterminate. Severe right V4 segment stenosis. 3. No flow limiting stenosis in the cervical carotid circulation. 4. Probable flow limiting stenosis at the carotid siphons, especially on the right. Electronically Signed   By: Jorje Guild M.D.   On: 01/19/2021 08:50   CT ANGIO NECK CODE STROKE  Result Date: 01/19/2021 CLINICAL DATA:  Generalized weakness with aphasia. Left-sided deficits. Vomiting. EXAM: CT ANGIOGRAPHY HEAD AND NECK TECHNIQUE: Multidetector CT imaging of the head and neck  was performed using the standard protocol during bolus administration of intravenous contrast. Multiplanar CT image reconstructions and MIPs were obtained to evaluate the vascular anatomy. Carotid stenosis measurements (when applicable) are obtained utilizing NASCET criteria, using the distal internal carotid diameter as the denominator. CONTRAST:  10mL OMNIPAQUE IOHEXOL 350 MG/ML SOLN COMPARISON:  None. FINDINGS: CTA NECK FINDINGS Aortic arch: Atheromatous plaque. 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 No dissection or ulceration. Left carotid system: Age advanced atheromatous plaque along the common carotid and proximal ICA without flow limiting stenosis or ulceration. Vertebral arteries: Aberrant right subclavian artery. Significant for age atheromatous plaque at the bilateral proximal subclavian, but without flow limiting stenosis. Heavily diseased vertebral  arteries with extensive calcific plaque and multifocal narrowing. The origins are difficult to assess due to small vessel size, calcified plaque, and body habitus. Both vertebral arteries are patent at the dura although there is subsequent occlusion on the left. Skeleton: No acute finding Other neck: No acute finding Upper chest: No acute finding Review of the MIP images confirms the above findings CTA HEAD FINDINGS Anterior circulation: Heavily diseased carotid siphon with calcified plaque and multifocal luminal stenosis. The degree of calcified plaque limits lumen measurement, expect flow limiting stenosis on both sides. 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 Extensive atheromatous plaque affecting the M1 segments and MCA branches. Moderate narrowing at the bilateral M1 segment. Negative for aneurysm. Posterior circulation: Heavily diseased vertebral arteries with calcified plaque. Severe right vertebral stenosis beyond the PICA. Left proximal V4 segment occlusion with potentially retrograde flow seen at the proximal left PICA. Narrow basilar diffusely with 2 segments of non enhancement. Fetal type bilateral PCA flow. Venous sinuses: No emergent finding Anatomic variants: As above Review of the MIP images confirms the above findings Critical Value/emergent results were called by telephone at the time of interpretation on 01/19/2021 at 8:42 am to provider ERIC [facility] , who verbally acknowledged these results. IMPRESSION: 1. Generalized and severe atherosclerosis, especially for age. 2. Dominant findings in the posterior circulation where there are left vertebral and basilar occlusions which are age indeterminate. Severe right V4 segment stenosis. 3. No flow limiting stenosis in the cervical carotid circulation. 4. Probable flow limiting stenosis at the carotid siphons, especially on the right. Electronically Signed   By: Jorje Guild M.D.   On: 01/19/2021 08:50     Microbiology: Results for orders placed or performed during the hospital encounter of 02/06/21  Resp Panel by RT-PCR (Flu A&B, Covid) Nasopharyngeal Swab     Status: None   Collection Time: 02/07/21  6:35 AM   Specimen: Nasopharyngeal Swab; Nasopharyngeal(NP) swabs in vial transport medium  Result Value Ref Range Status   SARS Coronavirus 2 by RT PCR NEGATIVE NEGATIVE Final    Comment: (NOTE) SARS-CoV-2 target nucleic acids are NOT DETECTED.  The SARS-CoV-2 RNA is generally detectable in upper respiratory specimens during the acute phase of infection. The lowest concentration of SARS-CoV-2 viral copies this assay can detect is 138 copies/mL. A negative result does not preclude SARS-Cov-2 infection and should not be used as the sole basis for treatment or other patient management decisions. A negative result may occur with  improper specimen collection/handling, submission of specimen other than nasopharyngeal swab, presence of viral mutation(s) within the areas targeted by this assay, and inadequate number of  viral copies(<138 copies/mL). A negative result must be combined with clinical observations, patient history, and epidemiological information. The expected result is Negative.  Fact Sheet for Patients:  EntrepreneurPulse.com.au  Fact Sheet for Healthcare Providers:  IncredibleEmployment.be  This test is no t yet approved or cleared by the [place] FDA and  has been authorized for detection and/or diagnosis of SARS-CoV-2 by FDA under an Emergency Use Authorization (EUA). This EUA will remain  in effect (meaning this test can be used) for the duration of the COVID-19 declaration under Section 564(b)(1) of the Act, 21 U.S.C.section 360bbb-3(b)(1), unless the authorization is terminated  or revoked sooner.       Influenza A by PCR NEGATIVE NEGATIVE Final   Influenza B by PCR NEGATIVE NEGATIVE Final    Comment: (NOTE) The Xpert  Xpress SARS-CoV-2/FLU/RSV plus assay is intended as an aid in the diagnosis of influenza from Nasopharyngeal swab specimens and should not be used as a sole basis for treatment. Nasal washings and aspirates are unacceptable for Xpert Xpress SARS-CoV-2/FLU/RSV testing.  Fact Sheet for Patients: EntrepreneurPulse.com.au  Fact Sheet for Healthcare Providers: IncredibleEmployment.be  This test is not yet approved or cleared by the [place] FDA and has been authorized for detection and/or diagnosis of SARS-CoV-2 by FDA under an Emergency Use Authorization (EUA). This EUA will remain in effect (meaning this test can be used) for the duration of the COVID-19 declaration under Section 564(b)(1) of the Act, 21 U.S.C. section 360bbb-3(b)(1), unless the authorization is terminated or revoked.  Performed at [facility], [place] [address], [place], [place] [address]   Urine Culture     Status: Abnormal (Preliminary result)   Collection Time: 02/07/21  6:50 AM   Specimen: Urine, Clean Catch  Result Value Ref Range Status   Specimen Description   Final    URINE, CLEAN CATCH Performed at [facility], [place] [address] [place], [place] [address]    Special Requests   Final    NONE Performed at [facility], [place] [address] [place], [place] [address]    Culture (A)  Final    >=100,000 COLONIES/mL PROTEUS MIRABILIS >=100,000 COLONIES/mL KLEBSIELLA PNEUMONIAE REPEATING SENSITIVITIES Performed at [facility], [place] [address] [place], [place] [address]    Report Status PENDING  Incomplete   Organism ID, Bacteria PROTEUS MIRABILIS (A)  Final      Susceptibility   Proteus mirabilis - MIC*    AMPICILLIN <=2 SENSITIVE Sensitive     CEFAZOLIN <=4 SENSITIVE Sensitive     CEFEPIME <=0.12 SENSITIVE Sensitive     CEFTRIAXONE <=0.25 SENSITIVE Sensitive     CIPROFLOXACIN <=0.25 SENSITIVE Sensitive      GENTAMICIN <=1 SENSITIVE Sensitive     IMIPENEM 2 SENSITIVE Sensitive     NITROFURANTOIN 128 RESISTANT Resistant     TRIMETH/SULFA <=20 SENSITIVE Sensitive     AMPICILLIN/SULBACTAM <=2 SENSITIVE Sensitive     PIP/TAZO <=4 SENSITIVE Sensitive     * >=100,000 COLONIES/mL PROTEUS MIRABILIS  Culture, blood (single)     Status: None (Preliminary result)   Collection Time: 02/07/21 12:15 PM   Specimen: BLOOD  Result Value Ref Range Status   Specimen Description   Final    BLOOD BLOOD LEFT FOREARM Performed at [place] [address], [place], [place] [address]    Special Requests   Final    BOTTLES DRAWN AEROBIC AND ANAEROBIC Blood Culture adequate volume Performed at [facility], [address] [place], [place] [address]    Culture   Final    NO GROWTH 4 DAYS Performed at [facility], [place] [address], [place], [place] [address]    Report Status PENDING  Incomplete    Labs: CBC: Recent Labs  Lab 02/07/21 0016 02/08/21 0433  WBC 12.3* 9.5  NEUTROABS 9.2*  --   HGB 13.9 11.6*  HCT 41.9 34.1*  MCV 82.2 80.6  PLT 501* 614   Basic Metabolic Panel: Recent Labs  Lab 02/07/21 0016 02/08/21 0433  NA 135 140  K 4.3 3.5  CL 100 103  CO2 23 25  GLUCOSE 228* 166*  BUN 9 6  CREATININE 0.69 0.64  CALCIUM 9.3 8.8*   Liver Function Tests: Recent Labs  Lab 02/07/21 0016 02/08/21 0433  AST 18 12*  ALT 25 20  ALKPHOS 91 80  BILITOT 0.8 0.8  PROT 8.1 7.3  ALBUMIN 3.8 3.3*   CBG: Recent Labs  Lab 02/10/21 0758 02/10/21 1134 02/10/21 1711 02/10/21 2004 02/11/21 0731  GLUCAP 289* 228* 222* 269* 177*    Discharge time spent: {LESS THAN/GREATER THAN:26388} 30 minutes.  Signed: Murray Hodgkins, MD [facility] 02/11/2021

## 2021-02-11 NOTE — Progress Notes (Signed)
°  Progress Note   Patient: Lori [name] MYT:[id number] DOB: 06-19-1970 DOA: 02/06/2021     4 DOS: the patient was seen and examined on 02/11/2021   Brief hospital course: 51 y.o. female with medical history significant of CVA w/ residuals, HTN, DM2, hyperthyroidism, anxiety/depression, tachycardia. Presented with N/V. Urosepsis.  Condition gradually improved with empiric antibiotics.  Final culture currently pending but has done well on ceftriaxone, will switch to oral. -- 1/8 stable for discharge.  Per TOC not able to go today.    Assessment and Plan * Sepsis ([place])- (present on admission) -- Secondary to UTI, sepsis symptomatology resolved.  Continue empiric treatment.  Acute lower UTI -- Continues to improve, continue empiric ceftriaxone pending culture results  Intractable nausea and vomiting-resolved as of 02/08/2021 -- Resolved at this point   Hyperthyroidism --TSH within normal limits.  Continue methimazole, metoprolol  Diabetes mellitus without complication (HCC) -- CBG overall stable, continue SSI, glucose checks, DM diet, continue semglee   Stroke ([place])- (present on admission) --Hx of CVA w/ paraplegia, left-sided weakness, dysarthria --Continue aspirin, statin, Plavix     Subjective:  Feels good Ate breakfast No vomiting today Ready to go back to facility  Objective Vital signs were reviewed and unremarkable. Physical Exam Constitutional:      General: She is not in acute distress.    Appearance: She is not ill-appearing or toxic-appearing.  Cardiovascular:     Rate and Rhythm: Normal rate and regular rhythm.     Heart sounds: No murmur heard. Pulmonary:     Effort: Pulmonary effort is normal. No respiratory distress.     Breath sounds: No wheezing, rhonchi or rales.  Neurological:     Mental Status: She is alert.  Psychiatric:        Mood and Affect: Mood normal.        Behavior: Behavior normal.     Data Reviewed:  CBG stable  Family  Communication: none  Disposition: Status is: Inpatient  Remains inpatient appropriate because: needs facility bed         Time spent: 20 minutes  Author: Murray Hodgkins, MD 02/11/2021 4:03 PM  For on call review [email or web address].

## 2021-02-11 NOTE — TOC Progression Note (Addendum)
Transition of Care [facility]) - Progression Note    Patient Details  Name: Lori [name] MRN: [id number] Date of Birth: 1970-12-19  [facility] CM/SW Contact  Ross Ludwig, [place] Phone Number: 02/11/2021, 12:24 PM  Clinical Narrative:     SNF is still waiting on insurance authorization.  CSW to continue to follow,  CSW updated attending physician.  New Covid test was ordered.  Expected Discharge Plan: [place] Barriers to Discharge: No Barriers Identified  Expected Discharge Plan and Services Expected Discharge Plan: [place] arrangements for the past 2 months: [place] Expected Discharge Date: 02/11/21                                     Social Determinants of Health (SDOH) Interventions    Readmission Risk Interventions No flowsheet data found.

## 2021-02-12 DIAGNOSIS — N390 Urinary tract infection, site not specified: Secondary | ICD-10-CM | POA: Diagnosis not present

## 2021-02-12 DIAGNOSIS — A419 Sepsis, unspecified organism: Secondary | ICD-10-CM | POA: Diagnosis not present

## 2021-02-12 DIAGNOSIS — E119 Type 2 diabetes mellitus without complications: Secondary | ICD-10-CM | POA: Diagnosis not present

## 2021-02-12 LAB — BASIC METABOLIC PANEL
Anion gap: 9 (ref 5–15)
BUN: 19 mg/dL (ref 6–20)
CO2: 23 mmol/L (ref 22–32)
Calcium: 8.2 mg/dL — ABNORMAL LOW (ref 8.9–10.3)
Chloride: 102 mmol/L (ref 98–111)
Creatinine, Ser: 0.86 mg/dL (ref 0.44–1.00)
GFR, Estimated: >60 mL/min (ref >60)
Glucose, Bld: 109 mg/dL — ABNORMAL HIGH (ref 70–99)
Potassium: 4 mmol/L (ref 3.5–5.1)
Sodium: 134 mmol/L — ABNORMAL LOW (ref 135–145)

## 2021-02-12 LAB — GLUCOSE, CAPILLARY
Glucose-Capillary: 153 mg/dL — ABNORMAL HIGH (ref 70–99)
Glucose-Capillary: 226 mg/dL — ABNORMAL HIGH (ref 70–99)
Glucose-Capillary: 172 mg/dL — ABNORMAL HIGH (ref 70–99)
Glucose-Capillary: 155 mg/dL — ABNORMAL HIGH (ref 70–99)

## 2021-02-12 LAB — CULTURE, BLOOD (SINGLE)
Culture: NO GROWTH
Special Requests: ADEQUATE

## 2021-02-12 NOTE — Plan of Care (Signed)
°  Problem: Education: Goal: Knowledge of General Education information will improve Description: Including pain rating scale, medication(s)/side effects and non-pharmacologic comfort measures 02/12/2021 1330 by Sanjuana Mae, RN Outcome: Adequate for Discharge 02/12/2021 0934 by Sanjuana Mae, RN Outcome: Progressing

## 2021-02-12 NOTE — TOC Progression Note (Signed)
Transition of Care [facility]) - Progression Note    Patient Details  Name: Lori [name] MRN: [id number] Date of Birth: 04-02-1970  [facility]) CM/SW Contact  Purcell Mouton, RN Phone Number: 02/12/2021, 1:58 PM  Clinical Narrative:     Spoke with [facility], [facility] no insurance [place].   Expected Discharge Plan: [place] Barriers to Discharge: No Barriers Identified  Expected Discharge Plan and Services Expected Discharge Plan: [place] arrangements for the past 2 months: [place] Expected Discharge Date: 02/11/21                                     Social Determinants of Health (SDOH) Interventions    Readmission Risk Interventions No flowsheet data found.

## 2021-02-12 NOTE — Progress Notes (Signed)
°  Progress Note   Patient: Lori [name] CBS:[id number] DOB: [birth year] DOA: 02/06/2021     5 DOS: the patient was seen and examined on 02/12/2021   Brief hospital course: 51 y.o. female with medical history significant of CVA w/ residuals, HTN, DM2, hyperthyroidism, anxiety/depression, tachycardia. Presented with N/V. Urosepsis.  Condition gradually improved with empiric antibiotics.  -- 1/9 stable for discharge.  Per TOC not able to go today.    Assessment and Plan * Sepsis (HCC)-resolved as of 02/12/2021, (present on admission) -- Secondary to UTI, sepsis symptomatology resolved.  Complete antibiotics.  Acute lower UTI -- Appears resolved, final culture data noted.  Intractable nausea and vomiting-resolved as of 02/08/2021 -- Resolved at this point   Hyperthyroidism --TSH within normal limits.  Continue methimazole, metoprolol  Diabetes mellitus without complication (HCC) -- CBG overall stable, continue SSI, glucose checks, DM diet, continue semglee   Stroke ([place])- (present on admission) --Hx of CVA w/ paraplegia, left-sided weakness, dysarthria --Continue aspirin, statin, Plavix     Subjective:  Feels fine No nausea Eating   Objective Vital signs were reviewed and unremarkable. Physical Exam Vitals reviewed.  Constitutional:      General: She is not in acute distress.    Appearance: She is not ill-appearing or toxic-appearing.  Cardiovascular:     Rate and Rhythm: Normal rate and regular rhythm.     Heart sounds: No murmur heard. Pulmonary:     Effort: Pulmonary effort is normal. No respiratory distress.     Breath sounds: No wheezing or rales.  Neurological:     Mental Status: She is alert.  Psychiatric:        Mood and Affect: Mood normal.        Behavior: Behavior normal.     Data Reviewed:  BMP stable  Family Communication: none  Disposition: Status is: Inpatient  Remains inpatient appropriate because: waiting for authorization to return to  SNF         Time spent: 20 minutes  Author: Murray Hodgkins, MD 02/12/2021 5:26 PM  For on call review [email or web address].

## 2021-02-12 NOTE — Plan of Care (Signed)
  Problem: Education: Goal: Knowledge of General Education information will improve Description Including pain rating scale, medication(s)/side effects and non-pharmacologic comfort measures Outcome: Progressing   

## 2021-02-13 DIAGNOSIS — I639 Cerebral infarction, unspecified: Secondary | ICD-10-CM | POA: Diagnosis not present

## 2021-02-13 DIAGNOSIS — A419 Sepsis, unspecified organism: Secondary | ICD-10-CM | POA: Diagnosis not present

## 2021-02-13 DIAGNOSIS — N390 Urinary tract infection, site not specified: Secondary | ICD-10-CM | POA: Diagnosis not present

## 2021-02-13 LAB — GLUCOSE, CAPILLARY
Glucose-Capillary: 199 mg/dL — ABNORMAL HIGH (ref 70–99)
Glucose-Capillary: 198 mg/dL — ABNORMAL HIGH (ref 70–99)
Glucose-Capillary: 135 mg/dL — ABNORMAL HIGH (ref 70–99)
Glucose-Capillary: 172 mg/dL — ABNORMAL HIGH (ref 70–99)

## 2021-02-13 MED ORDER — SENNA 8.6 MG PO TABS
1.0000 | ORAL_TABLET | Freq: Every day | ORAL | Status: DC
  Administered 2021-02-13 – 2021-02-14 (×2): 8.6 mg via ORAL
  Filled 2021-02-13 (×2): qty 1

## 2021-02-13 MED ORDER — SORBITOL 70 % SOLN
960.0000 mL | TOPICAL_OIL | Freq: Once | ORAL | Status: AC
  Administered 2021-02-13: 960 mL via RECTAL
  Filled 2021-02-13: qty 473

## 2021-02-13 MED ORDER — POLYETHYLENE GLYCOL 3350 17 G PO PACK
17.0000 g | PACK | Freq: Two times a day (BID) | ORAL | Status: DC
  Administered 2021-02-13 – 2021-02-15 (×3): 17 g via ORAL
  Filled 2021-02-13 (×3): qty 1

## 2021-02-13 NOTE — Progress Notes (Signed)
°  Progress Note   Patient: Lori [name] AXE:[id number] DOB: 06/05/1970 DOA: 02/06/2021     6 DOS: the patient was seen and examined on 02/13/2021   Brief hospital course: 51 y.o. female with medical history significant of CVA w/ residuals, HTN, DM2, hyperthyroidism, anxiety/depression, tachycardia. Presented with N/V. Urosepsis.  Condition gradually improved with empiric antibiotics and condition now stable for discharge.  Awaiting SNF versus CIR.    Assessment and Plan * Sepsis (HCC)-resolved as of 02/12/2021, (present on admission) -- Secondary to UTI, sepsis symptomatology resolved.  Complete antibiotics.  Acute lower UTI-resolved as of 02/13/2021 -- Resolved, final culture data noted.  Intractable nausea and vomiting-resolved as of 02/08/2021 -- Resolved at this point   Hyperthyroidism --TSH within normal limits.  Continue methimazole, metoprolol  Diabetes mellitus without complication (HCC) -- CBG overall stable, continue SSI, glucose checks, DM diet, continue semglee   Stroke ([place])- (present on admission) --Hx of CVA w/ paraplegia, left-sided weakness, dysarthria --Continue aspirin, statin, Plavix     Subjective:  Feels okay today, no complaints.  Objective Vital signs were reviewed and unremarkable. Physical Exam Constitutional:      General: She is not in acute distress.    Appearance: She is not ill-appearing or toxic-appearing.  Cardiovascular:     Rate and Rhythm: Normal rate and regular rhythm.     Heart sounds: No murmur heard. Pulmonary:     Effort: Pulmonary effort is normal. No respiratory distress.     Breath sounds: No wheezing, rhonchi or rales.  Neurological:     Mental Status: She is alert.  Psychiatric:        Mood and Affect: Mood normal.        Behavior: Behavior normal.   Data Reviewed:  CBG stable  Family Communication: none  Disposition: Status is: Inpatient  Remains inpatient appropriate because: await SNF         Time  spent: 20 minutes  Author: Murray Hodgkins, MD 02/13/2021 5:01 PM  For on call review [email or web address].

## 2021-02-13 NOTE — TOC Progression Note (Signed)
Transition of Care [facility]) - Progression Note    Patient Details  Name: Lori [name] MRN: [id number] Date of Birth: 1970/12/17  [facility] CM/SW Contact  Purcell Mouton, RN Phone Number: 02/13/2021, 9:35 AM  Clinical Narrative:     Updated notes fax to [facility].   Expected Discharge Plan: [place] Barriers to Discharge: No Barriers Identified  Expected Discharge Plan and Services Expected Discharge Plan: [place] arrangements for the past 2 months: [place] Expected Discharge Date: 02/11/21                                     Social Determinants of Health (SDOH) Interventions    Readmission Risk Interventions No flowsheet data found.

## 2021-02-13 NOTE — Evaluation (Signed)
Physical Therapy Evaluation Patient Details Name: Lori [name] MRN: [id number] DOB: 30-Jan-1971 Today's Date: 02/13/2021  History of Present Illness  Lori [name] is a 51 y.o. female who presents with c/o nausea and vomiting. Pt admitted 02/06/21 with acute lower UTI. PMH: type 2 diabetes, CAD s/p CABG, CVA with paraplegia, HTN, hyperlipidemia, hyperthyroidism   Clinical Impression  Pt admitted with above diagnosis. Pt recently d/c from hospital to SNF, hasn't ambulated but reports taking 2 sidesteps, requires assist with stand pivot transfers and bed mobility. Pt currently requiring mod A with bed mobility and stand pivot transfer to drop arm recliner. Pt very motivated to return home, reports daughter is able to assist at home, lives in 3rd floor apartment. Recommend CIR and OT consult for LUE weakness. Pt currently with functional limitations due to the deficits listed below (see PT Problem List). Pt will benefit from skilled PT to increase their independence and safety with mobility to allow discharge to the venue listed below.          Recommendations for follow up therapy are one component of a multi-disciplinary discharge planning process, led by the attending physician.  Recommendations may be updated based on patient status, additional functional criteria and insurance authorization.  Follow Up Recommendations Acute inpatient rehab (3hours/day)    Assistance Recommended at Discharge Frequent or constant Supervision/Assistance  Patient can return home with the following       Equipment Recommendations None recommended by PT  Recommendations for Other Services  Rehab consult    Functional Status Assessment Patient has had a recent decline in their functional status and demonstrates the ability to make significant improvements in function in a reasonable and predictable amount of time.     Precautions / Restrictions Precautions Precautions: Fall Precaution Comments: L  hemi (UE>LE) Restrictions Weight Bearing Restrictions: No      Mobility  Bed Mobility Overal bed mobility: Needs Assistance Bed Mobility: Supine to Sit  Supine to sit: Mod assist;HOB elevated  General bed mobility comments: heavy use of bedrail to upright trunk, assist to mobilize LLE to EOB, use of bedpad to scoot pt to EOB    Transfers Overall transfer level: Needs assistance Equipment used: 1 person hand held assist Transfers: Sit to/from Stand;Bed to chair/wheelchair/BSC Sit to Stand: Mod assist Stand pivot transfers: Mod assist  General transfer comment: mod A to power to stand, VC for "nose over toes", therapist positioned at L side with knee blocking provided but no buckling noted, transferring to R side able to pivot on R foot to sit in drop arm recliner at side, therapist supporting L UE and pt holding onto chair armrest with R    Ambulation/Gait  General Gait Details: unable with +1 assist  Stairs            Wheelchair Mobility    Modified Rankin (Stroke Patients Only)       Balance Overall balance assessment: Needs assistance Sitting-balance support: Feet supported;Single extremity supported Sitting balance-Leahy Scale: Poor Sitting balance - Comments: RUE supporting, occasional LOB posterior able to self correct or min A to correct Postural control: Posterior lean Standing balance support: During functional activity;Single extremity supported Standing balance-Leahy Scale: Poor Standing balance comment: RUE on furniture with therapist supporting LUE       Pertinent Vitals/Pain Pain Assessment: No/denies pain    Home Living Family/patient expects to be discharged to:: Skilled nursing facility Living Arrangements: Children Available Help at Discharge: Family;Available 24 hours/day Type of Home: Apartment Home Access:  Stairs to enter   [employer] of Steps: Pt lives on 3rd floor apartment with multiple flights to access.   Home  Layout: One level Home Equipment: Rollator (4 wheels);Hand held shower head      Prior Function Prior Level of Function : Needs assist  Physical Assist : Mobility (physical);ADLs (physical) Mobility (physical): Bed mobility;Transfers;Gait ADLs (physical): Bathing;Dressing;Toileting;IADLs Mobility Comments: pt recently at SNF, performing stand pivot transfers with assistance, bed mobility with assistance, has taken 2 sidesteps ADLs Comments: pt recently at SNF, staff assisting with self care     Hand Dominance   Dominant Hand: Right    Extremity/Trunk Assessment   Upper Extremity Assessment Upper Extremity Assessment: Defer to OT evaluation    Lower Extremity Assessment Lower Extremity Assessment: RLE deficits/detail;LLE deficits/detail RLE Deficits / Details: AROM WNL, strength 4/5 throughout LLE Deficits / Details: ankle AROM WNL, knee extension 3+/5, hip flexion 3+/5, hip abduction and adduction 3+/5       Communication   Communication: Expressive difficulties;No difficulties (extra time to verbalize words, slow/slurred speech but able to understand)  Cognition Arousal/Alertness: Awake/alert Behavior During Therapy: WFL for tasks assessed/performed Overall Cognitive Status: Within Functional Limits for tasks assessed         General Comments      Exercises     Assessment/Plan    PT Assessment Patient needs continued PT services  PT Problem List Decreased strength;Decreased mobility;Decreased range of motion;Decreased activity tolerance;Decreased balance;Decreased knowledge of use of DME;Decreased coordination       PT Treatment Interventions DME instruction;Gait training;Functional mobility training;Therapeutic activities;Therapeutic exercise;Balance training;Neuromuscular re-education;Patient/family education    PT Goals (Current goals can be found in the Care Plan section)  Acute Rehab PT Goals Patient Stated Goal: "I want to go home" PT Goal Formulation:  With patient Time For Goal Achievement: 02/27/21 Potential to Achieve Goals: Fair    Frequency       Co-evaluation               AM-PAC PT "6 Clicks" Mobility  Outcome Measure Help needed turning from your back to your side while in a flat bed without using bedrails?: A Lot Help needed moving from lying on your back to sitting on the side of a flat bed without using bedrails?: A Lot Help needed moving to and from a bed to a chair (including a wheelchair)?: A Lot Help needed standing up from a chair using your arms (e.g., wheelchair or bedside chair)?: A Lot Help needed to walk in hospital room?: Total Help needed climbing 3-5 steps with a railing? : Total 6 Click Score: 10    End of Session Equipment Utilized During Treatment: Gait belt Activity Tolerance: Patient tolerated treatment well Patient left: in chair;with call bell/phone within reach Nurse Communication: Mobility status;Other (comment) (drop arm recliner for transfer back to bed) PT Visit Diagnosis: Unsteadiness on feet (R26.81);Muscle weakness (generalized) (M62.81);Hemiplegia and hemiparesis;Other abnormalities of gait and mobility (R26.89)    Time: 1610-9604 PT Time Calculation (min) (ACUTE ONLY): 20 min   Charges:   PT Evaluation $PT Eval Moderate Complexity: 1 Mod           Tori Vernor Monnig PT, DPT 02/13/21, 4:05 PM

## 2021-02-13 NOTE — Progress Notes (Signed)
Inpatient Rehab Admissions Coordinator:   Per therapy recommendation, patient was screened for CIR candidacy by Clemens Catholic, MS, CCC-SLP.  Pt. Was worked up for [facility] during recent admission; however, she was ultimately discharged to SNF due to inaccessible home environment and lack of necessary support at d/c (Pt. Apparently has ~30 steps to enter her home). Unless Pt.'s dispo has changed, she likely needs to return to SNF for rehab.  Please contact me with any questions.   Clemens Catholic, [place], [place] Admissions Coordinator  [phone] ([place]) [phone] (office)

## 2021-02-14 ENCOUNTER — Inpatient Hospital Stay (HOSPITAL_COMMUNITY): Payer: Medicare Other

## 2021-02-14 DIAGNOSIS — E7849 Other hyperlipidemia: Secondary | ICD-10-CM

## 2021-02-14 DIAGNOSIS — N390 Urinary tract infection, site not specified: Secondary | ICD-10-CM | POA: Diagnosis not present

## 2021-02-14 DIAGNOSIS — R112 Nausea with vomiting, unspecified: Secondary | ICD-10-CM | POA: Diagnosis not present

## 2021-02-14 DIAGNOSIS — E119 Type 2 diabetes mellitus without complications: Secondary | ICD-10-CM | POA: Diagnosis not present

## 2021-02-14 DIAGNOSIS — A419 Sepsis, unspecified organism: Secondary | ICD-10-CM | POA: Diagnosis not present

## 2021-02-14 LAB — CBC WITH DIFFERENTIAL/PLATELET
Abs Immature Granulocytes: 0.11 10*3/uL — ABNORMAL HIGH (ref 0.00–0.07)
Basophils Absolute: 0.1 10*3/uL (ref 0.0–0.1)
Basophils Relative: 1 %
Eosinophils Absolute: 0 10*3/uL (ref 0.0–0.5)
Eosinophils Relative: 0 %
HCT: 37.5 % (ref 36.0–46.0)
Hemoglobin: 12.8 g/dL (ref 12.0–15.0)
Immature Granulocytes: 1 %
Lymphocytes Relative: 17 %
Lymphs Abs: 2 10*3/uL (ref 0.7–4.0)
MCH: 27.5 pg (ref 26.0–34.0)
MCHC: 34.1 g/dL (ref 30.0–36.0)
MCV: 80.5 fL (ref 80.0–100.0)
Monocytes Absolute: 1 10*3/uL (ref 0.1–1.0)
Monocytes Relative: 8 %
Neutro Abs: 8.7 10*3/uL — ABNORMAL HIGH (ref 1.7–7.7)
Neutrophils Relative %: 73 %
Platelets: 420 10*3/uL — ABNORMAL HIGH (ref 150–400)
RBC: 4.66 MIL/uL (ref 3.87–5.11)
RDW: 15.8 % — ABNORMAL HIGH (ref 11.5–15.5)
WBC: 11.8 10*3/uL — ABNORMAL HIGH (ref 4.0–10.5)
nRBC: 0 % (ref 0.0–0.2)

## 2021-02-14 LAB — COMPREHENSIVE METABOLIC PANEL
ALT: 16 U/L (ref 0–44)
AST: 13 U/L — ABNORMAL LOW (ref 15–41)
Albumin: 3.3 g/dL — ABNORMAL LOW (ref 3.5–5.0)
Alkaline Phosphatase: 94 U/L (ref 38–126)
Anion gap: 12 (ref 5–15)
BUN: 8 mg/dL (ref 6–20)
CO2: 23 mmol/L (ref 22–32)
Calcium: 8.9 mg/dL (ref 8.9–10.3)
Chloride: 100 mmol/L (ref 98–111)
Creatinine, Ser: 0.67 mg/dL (ref 0.44–1.00)
GFR, Estimated: >60 mL/min (ref >60)
Glucose, Bld: 238 mg/dL — ABNORMAL HIGH (ref 70–99)
Potassium: 3.8 mmol/L (ref 3.5–5.1)
Sodium: 135 mmol/L (ref 135–145)
Total Bilirubin: 0.9 mg/dL (ref 0.3–1.2)
Total Protein: 7.8 g/dL (ref 6.5–8.1)

## 2021-02-14 LAB — GLUCOSE, CAPILLARY
Glucose-Capillary: 270 mg/dL — ABNORMAL HIGH (ref 70–99)
Glucose-Capillary: 223 mg/dL — ABNORMAL HIGH (ref 70–99)
Glucose-Capillary: 191 mg/dL — ABNORMAL HIGH (ref 70–99)
Glucose-Capillary: 175 mg/dL — ABNORMAL HIGH (ref 70–99)
Glucose-Capillary: 175 mg/dL — ABNORMAL HIGH (ref 70–99)

## 2021-02-14 IMAGING — DX DG ABDOMEN 1V
2 series · 2 of 2 positions shown · non-contrast
Comparison: [DATE]

CLINICAL DATA: Nausea and vomiting

EXAM:
ABDOMEN - 1 VIEW

[abdomen kub (1 of 2)]
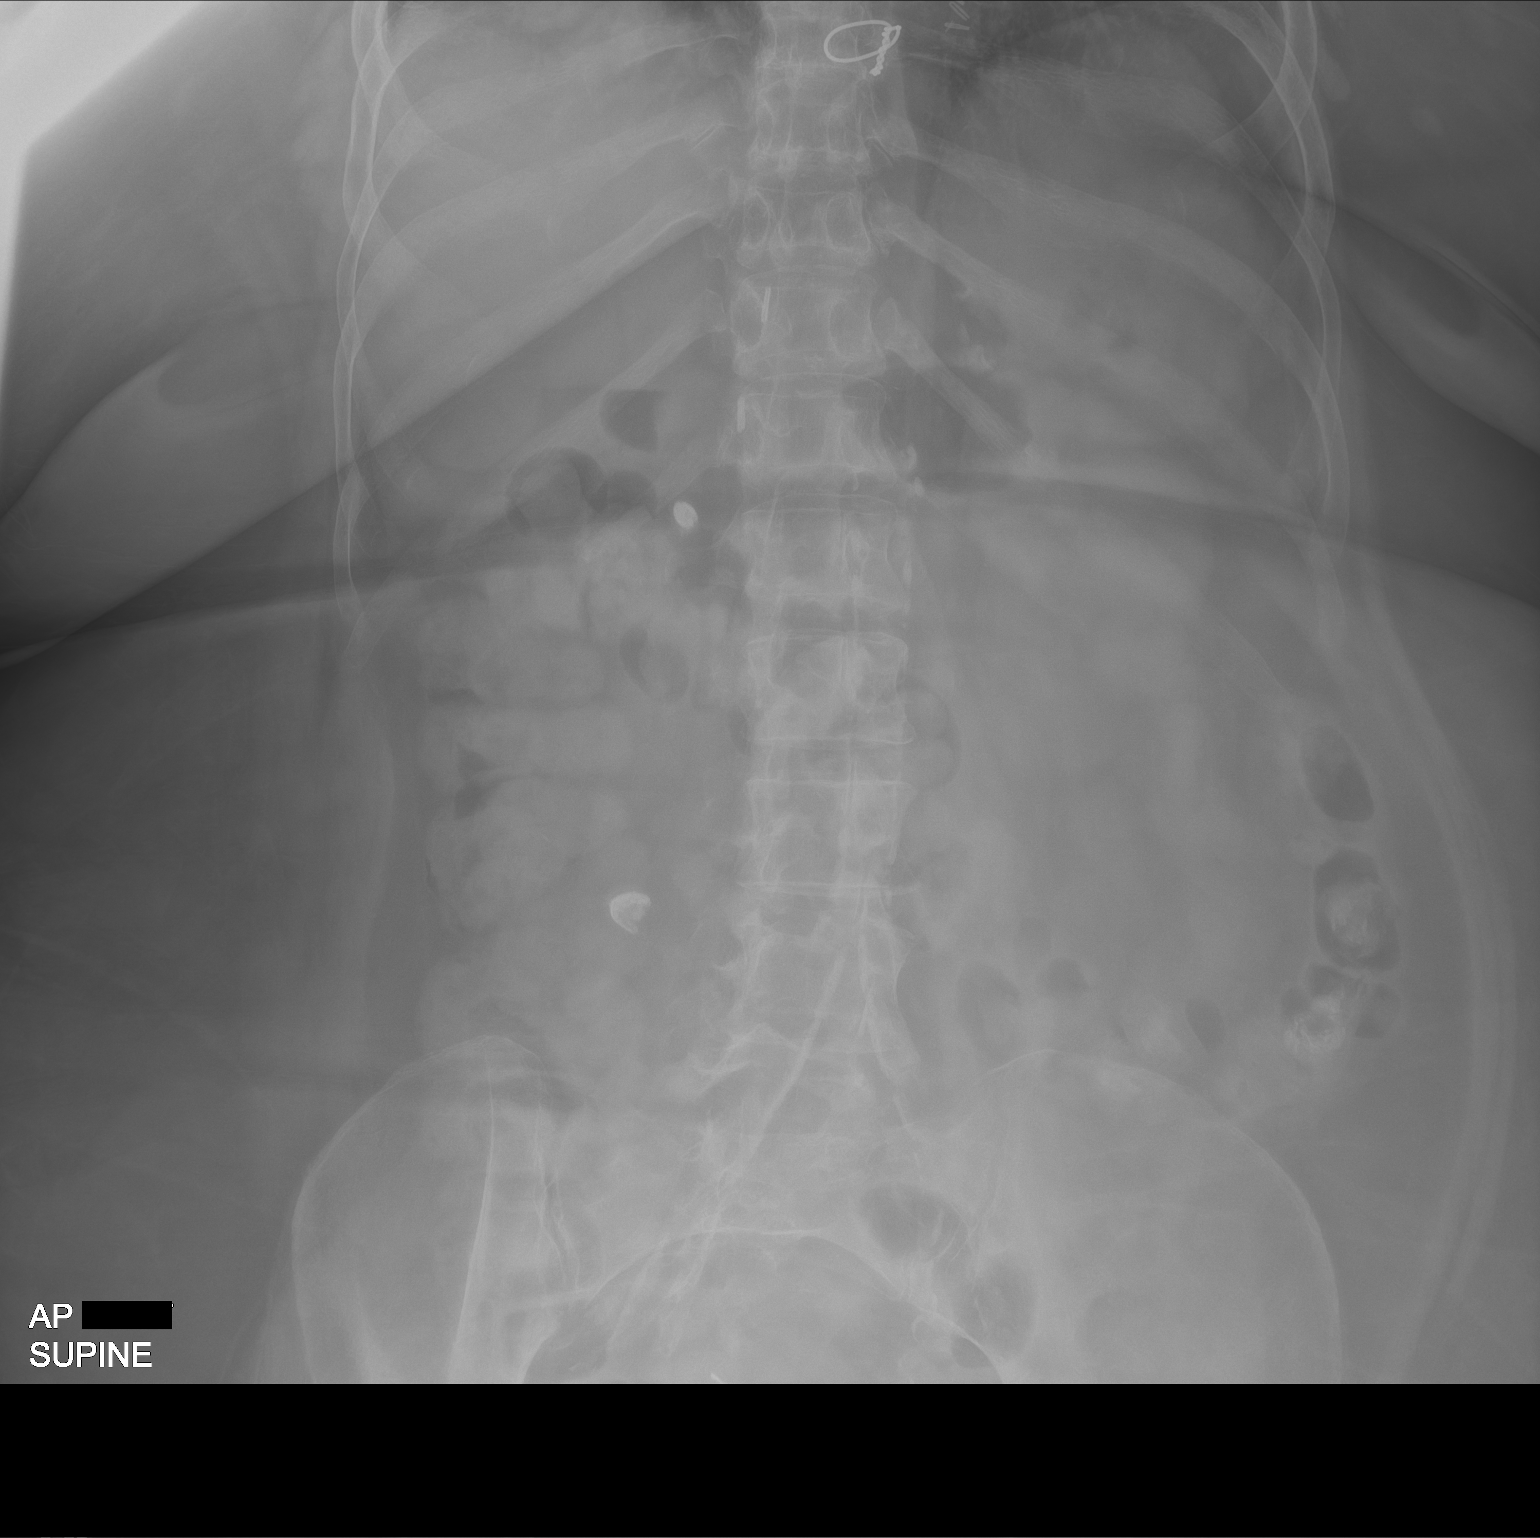

[abdomen kub (2 of 2)]
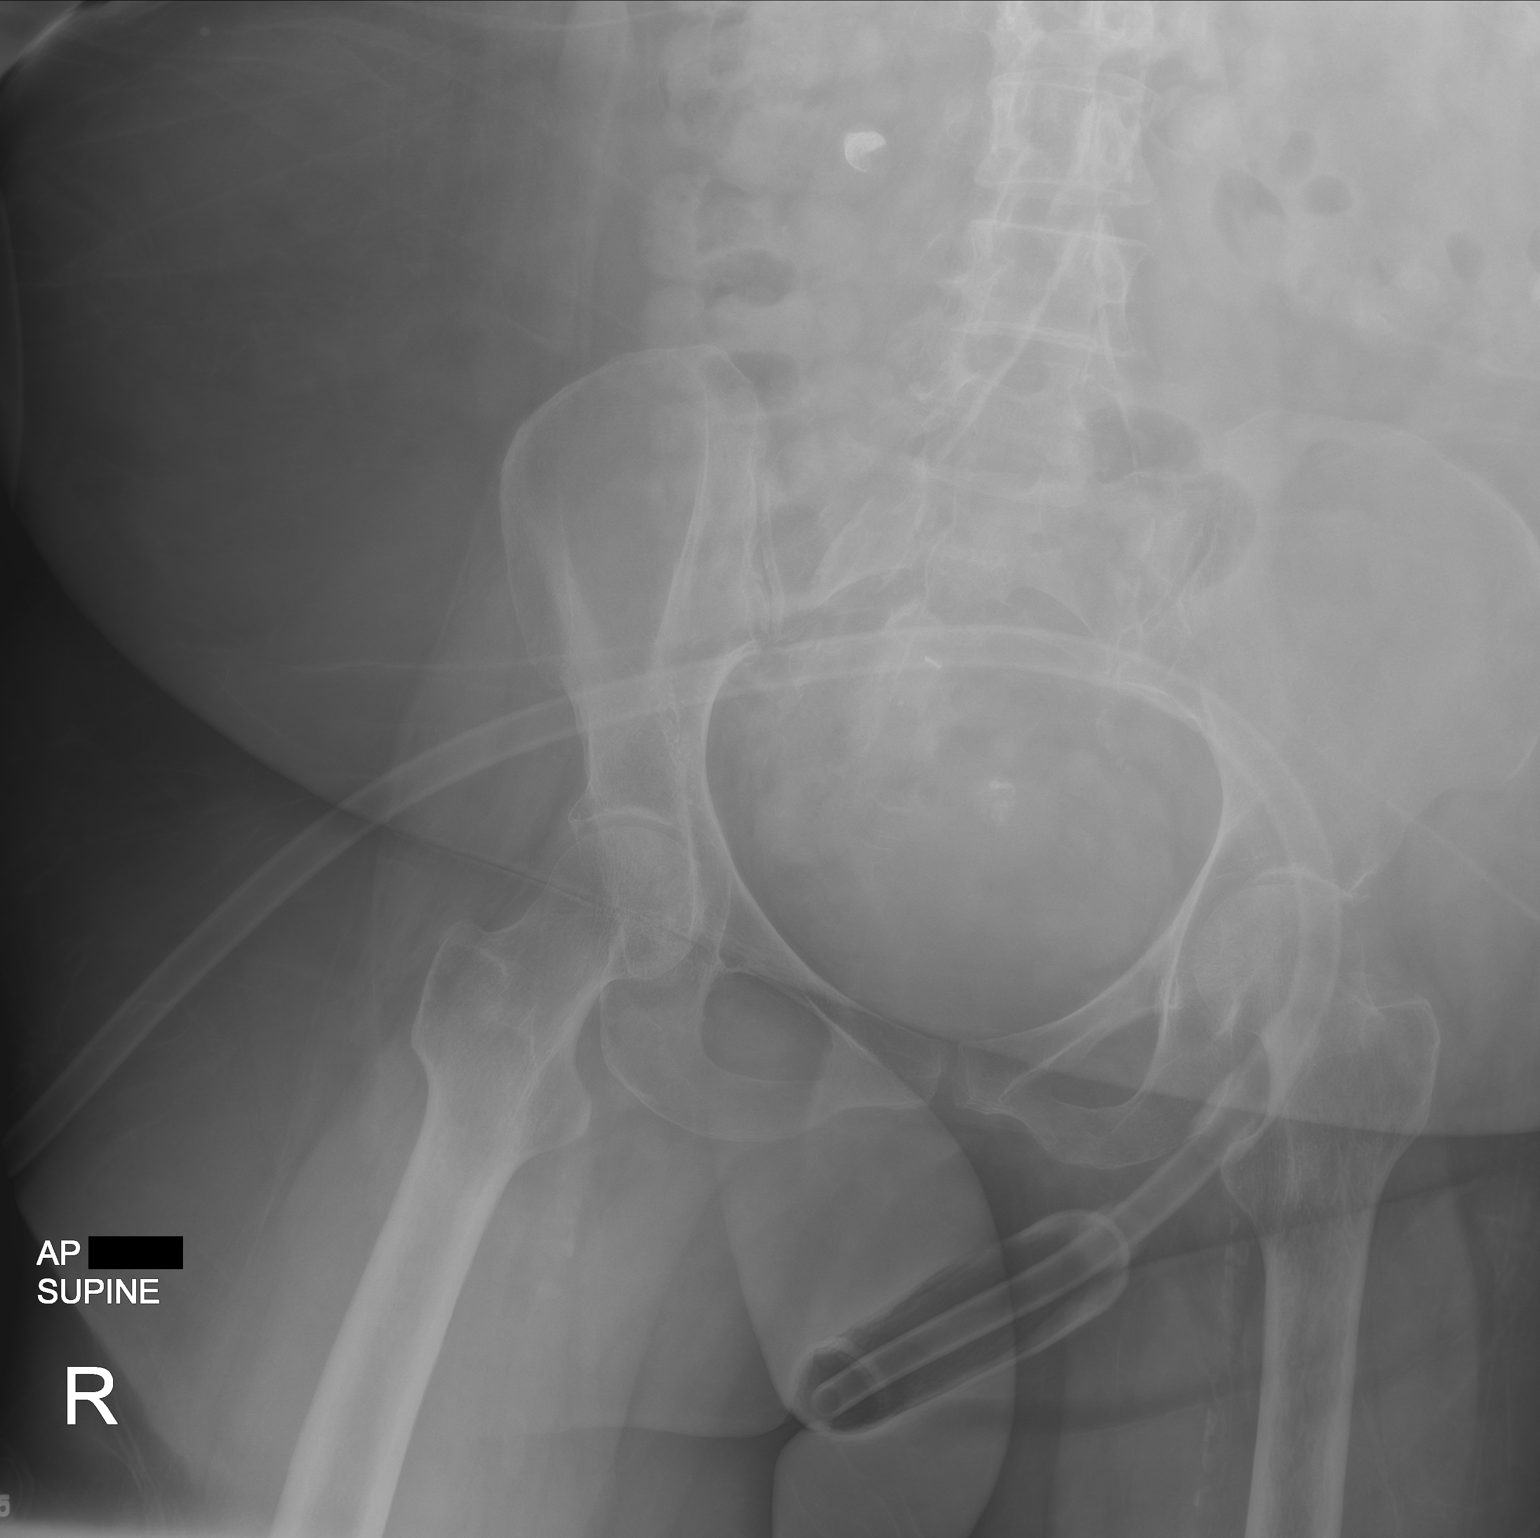

[2 of 2 positions shown; findings below may reference images not displayed]

FINDINGS: Right abdominal and central pelvic foci of hyperattenuation are
likely contrast within the colon. The majority of the contrast has
passed since [DATE].

No gaseous distention of bowel loops. No gross free intraperitoneal
air. Vascular calcifications.
IMPRESSION: No acute findings.

## 2021-02-14 MED ORDER — METOCLOPRAMIDE HCL 5 MG/ML IJ SOLN
5.0000 mg | Freq: Once | INTRAMUSCULAR | Status: AC
  Administered 2021-02-14: 5 mg via INTRAVENOUS
  Filled 2021-02-14: qty 2

## 2021-02-14 MED ORDER — CYCLOBENZAPRINE HCL 5 MG PO TABS
5.0000 mg | ORAL_TABLET | Freq: Three times a day (TID) | ORAL | Status: DC | PRN
  Administered 2021-02-14: 5 mg via ORAL
  Filled 2021-02-14: qty 1

## 2021-02-14 MED ORDER — METOPROLOL TARTRATE 5 MG/5ML IV SOLN
2.5000 mg | Freq: Once | INTRAVENOUS | Status: AC
  Administered 2021-02-14: 2.5 mg via INTRAVENOUS
  Filled 2021-02-14: qty 5

## 2021-02-14 NOTE — TOC Progression Note (Signed)
Transition of Care [facility]) - Progression Note    Patient Details  Name: Lori [name] MRN: [id number] Date of Birth: 01-19-1971  [facility]) CM/SW Contact  Purcell Mouton, RN Phone Number: 02/14/2021, 1:38 PM  Clinical Narrative:    SNF have Insurance authorization. Pt may return to [facility].    Expected Discharge Plan: [place] Barriers to Discharge: No Barriers Identified  Expected Discharge Plan and Services Expected Discharge Plan: [place] arrangements for the past 2 months: [place] Expected Discharge Date: 02/11/21                                     Social Determinants of Health (SDOH) Interventions    Readmission Risk Interventions No flowsheet data found.

## 2021-02-14 NOTE — Progress Notes (Signed)
Occupational Therapy Treatment Patient Details Name: Lori [name] MRN: [id number] DOB: November 19, 1970 Today's Date: 02/14/2021   History of present illness Lori [name] is a 51 y.o. female who presents with c/o nausea and vomiting. Pt admitted 02/06/21 with acute lower UTI. PMH: type 2 diabetes, CAD s/p CABG, CVA with paraplegia, HTN, hyperlipidemia, hyperthyroidism   OT comments  Patient requesting to get back to bed with reporting increased fatigue sitting up. Patient was mod A for initial attempts at transfer with max A x2 for completion of transfer with L knee giving out with stepping to bed. Patient was max A for positioning in bed sit to supine to increase independence in ADLs. Patient's discharge plan remains appropriate at this time. OT will continue to follow acutely.     Recommendations for follow up therapy are one component of a multi-disciplinary discharge planning process, led by the attending physician.  Recommendations may be updated based on patient status, additional functional criteria and insurance authorization.    Follow Up Recommendations  Acute inpatient rehab (3hours/day)    Assistance Recommended at Discharge Frequent or constant Supervision/Assistance  Patient can return home with the following  A lot of help with walking and/or transfers;A lot of help with bathing/dressing/bathroom;Direct supervision/assist for medications management;Help with stairs or ramp for entrance;Direct supervision/assist for financial management;Assistance with cooking/housework   Equipment Recommendations  Other (comment) (defer to next level)    Recommendations for Other Services Rehab consult    Precautions / Restrictions Precautions Precautions: Fall Precaution Comments: L hemi (UE>LE) Restrictions Weight Bearing Restrictions: No       Mobility Bed Mobility Overal bed mobility: Needs Assistance Bed Mobility: Supine to Sit     Supine to sit: Max assist;HOB  elevated     General bed mobility comments: patient needed to use physical assistance for trunk movement and BLE to edge of bed.    Transfers                         Balance Overall balance assessment: Needs assistance Sitting-balance support: Feet supported;Single extremity supported Sitting balance-Leahy Scale: Fair Sitting balance - Comments: patient was able to self correct sitting balance with min guard.   Standing balance support: During functional activity;Single extremity supported Standing balance-Leahy Scale: Poor Standing balance comment: consistent UE support                           ADL either performed or assessed with clinical judgement   ADL Overall ADL's : Needs assistance/impaired   Eating/Feeding Details (indicate cue type and reason): patient nauseted declining to demonstrate eating or drinking on this date Grooming: Wash/dry hands;Wash/dry face;Sitting;Minimal assistance   Upper Body Bathing: Sitting;Moderate assistance Upper Body Bathing Details (indicate cue type and reason): EOB Lower Body Bathing: Total assistance;Bed level   Upper Body Dressing : Maximal assistance;Sitting   Lower Body Dressing: Bed level;Total assistance   Toilet Transfer: Moderate assistance;+2 for physical assistance Toilet Transfer Details (indicate cue type and reason): simulated to bedside chair Toileting- Clothing Manipulation and Hygiene: Maximal assistance;Sit to/from stand;+2 for physical assistance       Functional mobility during ADLs: +2 for physical assistance;+2 for safety/equipment General ADL Comments: patient was seated in recliner about 30 mins after session with reports of increased nausea requesting to getting back to bed with max A x 2 to transfer stand pivot to bed with L knee giving way. patient was max A for  positioning in bed with education on importance of sitting upright. communcation with MD for splint initated at this time.     Extremity/Trunk Assessment Upper Extremity Assessment Upper Extremity Assessment: LUE deficits/detail LUE Deficits / Details: patient able to extend elbow AROM with noted flexor synergy pattern noted with sneezing and anxiousness. patient noted to have pain with PROM of middle digit with h/o fx. patient noted to have contracture with inability to PROM wrist into neutral with increased tone on this date. patient no AROM and limited AAROM about 15 degrees of flexion with increased tone noted with attempt to range. LUE Coordination: decreased gross motor;decreased fine motor   Lower Extremity Assessment Lower Extremity Assessment: Defer to PT evaluation   Cervical / Trunk Assessment Cervical / Trunk Assessment: Kyphotic    Vision Baseline Vision/History: 1 Wears glasses Patient Visual Report: No change from baseline     Perception     Praxis      Cognition Arousal/Alertness: Awake/alert Behavior During Therapy: WFL for tasks assessed/performed Overall Cognitive Status: Within Functional Limits for tasks assessed                                 General Comments: patient is motivated during session.          Exercises     Shoulder Instructions       General Comments      Pertinent Vitals/ Pain       Pain Assessment: Faces Faces Pain Scale: Hurts a little bit Pain Location: middle digit L hand with extension Pain Descriptors / Indicators: Discomfort Pain Intervention(s): Monitored during session  Home Living Family/patient expects to be discharged to:: Inpatient rehab Living Arrangements: Children Available Help at Discharge: Family;Available 24 hours/day Type of Home: Apartment Home Access: Stairs to enter Entrance Stairs-Number of Steps: Pt lives on 3rd floor apartment with multiple flights to access.   Home Layout: One level     Bathroom Shower/Tub: Chief Strategy Officer: Rollator (4 wheels);Hand held shower head           Prior Functioning/Environment              Frequency  Min 2X/week        Progress Toward Goals  OT Goals(current goals can now be found in the care plan section)  Progress towards OT goals: Progressing toward goals  Acute Rehab OT Goals Patient Stated Goal: to go to CIR OT Goal Formulation: With patient Time For Goal Achievement: 02/28/21 Potential to Achieve Goals: Good ADL Goals Additional ADL Goal #1: (P) patient to tolerate most appropriate splint  Plan Discharge plan remains appropriate    Co-evaluation                 AM-PAC OT "6 Clicks" Daily Activity     Outcome Measure   Help from another person eating meals?: A Little Help from another person taking care of personal grooming?: A Little Help from another person toileting, which includes using toliet, bedpan, or urinal?: A Lot Help from another person bathing (including washing, rinsing, drying)?: A Lot Help from another person to put on and taking off regular upper body clothing?: A Lot Help from another person to put on and taking off regular lower body clothing?: A Lot 6 Click Score: 14    End of Session Equipment Utilized During Treatment: Gait belt  OT Visit Diagnosis: Unsteadiness  on feet (R26.81);Muscle weakness (generalized) (M62.81);Other symptoms and signs involving the nervous system (R29.898);Hemiplegia and hemiparesis Hemiplegia - Right/Left: Left Hemiplegia - dominant/non-dominant: Non-Dominant Hemiplegia - caused by: Cerebral infarction   Activity Tolerance Patient tolerated treatment well   Patient Left in bed;with call bell/phone within reach;with bed alarm set   Nurse Communication Mobility status        Time: 1230-1240 OT Time Calculation (min): 10 min  Charges: OT General Charges $OT Visit: 1 Visit OT Evaluation $OT Eval Moderate Complexity: 1 Mod OT Treatments $Self Care/Home Management : 8-22 mins $Therapeutic Activity: 8-22 mins  Jackelyn Poling OTR/L, MS Acute  Rehabilitation Department Office# [phone] Pager# [phone]   Marcellina Millin 02/14/2021, 1:11 PM

## 2021-02-14 NOTE — TOC Progression Note (Signed)
Transition of Care [facility]) - Progression Note    Patient Details  Name: Lori [name] MRN: [id number] Date of Birth: 1970-07-02  Transition of Care [facility]) CM/SW Contact  Purcell Mouton, RN Phone Number: 02/14/2021, 2:10 PM  Clinical Narrative:     Pt placed on Will Call list for PTAR.   Expected Discharge Plan: [place] Barriers to Discharge: No Barriers Identified  Expected Discharge Plan and Services Expected Discharge Plan: [place] arrangements for the past 2 months: [place] Expected Discharge Date: 02/11/21                                     Social Determinants of Health (SDOH) Interventions    Readmission Risk Interventions No flowsheet data found.

## 2021-02-14 NOTE — Evaluation (Signed)
Occupational Therapy Evaluation Patient Details Name: Lori [name] MRN: [id number] DOB: [birth year] Today's Date: 02/14/2021   History of Present Illness Lori [name] is a 51 y.o. female who presents with c/o nausea and vomiting. Pt admitted 02/06/21 with acute lower UTI. PMH: type 2 diabetes, CAD s/p CABG, CVA with paraplegia, HTN, hyperlipidemia, hyperthyroidism   Clinical Impression   Patient is a pleasantly motivated 51 year old female who has a history of recent stroke with admission info above. Patient was living at home independently with family prior level. Currently, patient is +2 for transfers,max A for Adls, noted to have decreased ROM of wrist and digits leaving increased risk for contractures and skin breakdown.patient would benefit from acute interventions for L wrist/hand to maintain current levels of ROM. Patient reported having complete family support in next level of care with husband present during session reporting the same. Patient would continue to benefit from skilled OT services at this time while admitted and after d/c to address noted deficits in order to improve overall safety and independence in ADLs.       Recommendations for follow up therapy are one component of a multi-disciplinary discharge planning process, led by the attending physician.  Recommendations may be updated based on patient status, additional functional criteria and insurance authorization.   Follow Up Recommendations  Acute inpatient rehab (3hours/day)    Assistance Recommended at Discharge Frequent or constant Supervision/Assistance  Patient can return home with the following A lot of help with walking and/or transfers;A lot of help with bathing/dressing/bathroom;Direct supervision/assist for medications management;Help with stairs or ramp for entrance;Direct supervision/assist for financial management;Assistance with cooking/housework    Functional Status Assessment  Patient has had a  recent decline in their functional status and demonstrates the ability to make significant improvements in function in a reasonable and predictable amount of time.  Equipment Recommendations  Other (comment) (defer to next level)    Recommendations for Other Services Rehab consult     Precautions / Restrictions Precautions Precautions: Fall Precaution Comments: L hemi (UE>LE) Restrictions Weight Bearing Restrictions: No      Mobility Bed Mobility Overal bed mobility: Needs Assistance Bed Mobility: Supine to Sit     Supine to sit: Max assist;HOB elevated     General bed mobility comments: patient needed to use physical assistance for trunk movement and BLE to edge of bed.    Transfers                          Balance Overall balance assessment: Needs assistance Sitting-balance support: Feet supported;Single extremity supported Sitting balance-Leahy Scale: Fair Sitting balance - Comments: patient was able to self correct sitting balance with min guard.   Standing balance support: During functional activity;Single extremity supported Standing balance-Leahy Scale: Poor Standing balance comment: consistent UE support                           ADL either performed or assessed with clinical judgement   ADL                                               Vision Baseline Vision/History: 1 Wears glasses Patient Visual Report: No change from baseline       Perception     Praxis  Pertinent Vitals/Pain       Hand Dominance Right   Extremity/Trunk Assessment Upper Extremity Assessment Upper Extremity Assessment: LUE deficits/detail LUE Deficits / Details: patient able to extend elbow AROM with noted flexor synergy pattern noted with sneezing and anxiousness. patient noted to have pain with PROM of middle digit with h/o fx.           Communication Communication Communication: Expressive difficulties (extra time to  verbalize words but able to communicate)   Cognition                                             General Comments       Exercises     Shoulder Instructions      Home Living Family/patient expects to be discharged to:: Inpatient rehab Living Arrangements: Children Available Help at Discharge: Family;Available 24 hours/day Type of Home: Apartment Home Access: Stairs to enter Entrance Stairs-Number of Steps: Pt lives on 3rd floor apartment with multiple flights to access.   Home Layout: One level     Bathroom Shower/Tub: Chief Strategy Officer: Rollator (4 wheels);Hand held shower head          Prior Functioning/Environment Prior Level of Function : Needs assist       Physical Assist : Mobility (physical);ADLs (physical) Mobility (physical): Bed mobility;Transfers;Gait ADLs (physical): Bathing;Dressing;Toileting;IADLs Mobility Comments: pt recently at SNF, performing stand pivot transfers with assistance, bed mobility with assistance, has taken 2 sidesteps ADLs Comments: pt recently at SNF, staff assisting with self care. prior to CVA patient was independent in ADLs with husband doing cooking.        OT Problem List: Decreased strength;Decreased range of motion;Decreased activity tolerance;Impaired balance (sitting and/or standing);Impaired vision/perception;Decreased coordination;Decreased knowledge of use of DME or AE;Impaired tone;Impaired UE functional use      OT Treatment/Interventions: Self-care/ADL training;Therapeutic exercise;DME and/or AE instruction;Therapeutic activities;Patient/family education;Balance training;Neuromuscular education;Visual/perceptual remediation/compensation    OT Goals(Current goals can be found in the care plan section) Acute Rehab OT Goals Patient Stated Goal: to go to CIR OT Goal Formulation: With patient Time For Goal Achievement: 02/28/21 Potential to Achieve Goals: Good  OT Frequency: Min  2X/week    Co-evaluation              AM-PAC OT "6 Clicks" Daily Activity     Outcome Measure Help from another person eating meals?: A Little Help from another person taking care of personal grooming?: A Little Help from another person toileting, which includes using toliet, bedpan, or urinal?: A Lot Help from another person bathing (including washing, rinsing, drying)?: A Lot Help from another person to put on and taking off regular upper body clothing?: A Lot Help from another person to put on and taking off regular lower body clothing?: A Lot 6 Click Score: 14   End of Session Equipment Utilized During Treatment: Gait belt Nurse Communication: Mobility status  Activity Tolerance: Patient tolerated treatment well Patient left: in chair;with call bell/phone within reach  OT Visit Diagnosis: Unsteadiness on feet (R26.81);Muscle weakness (generalized) (M62.81);Other symptoms and signs involving the nervous system (R29.898);Hemiplegia and hemiparesis Hemiplegia - Right/Left: Left Hemiplegia - dominant/non-dominant: Non-Dominant Hemiplegia - caused by: Cerebral infarction                Time: 9485-4627 OT Time Calculation (min): 38 min Charges:  OT  General Charges $OT Visit: 1 Visit OT Evaluation $OT Eval Moderate Complexity: 1 Mod OT Treatments $Self Care/Home Management : 8-22 mins $Therapeutic Activity: 8-22 mins  Jackelyn Poling OTR/L, MS Acute Rehabilitation Department Office# [phone] Pager# [phone]   Marcellina Millin 02/14/2021, 12:23 PM

## 2021-02-14 NOTE — Progress Notes (Signed)
Orthopedic Tech Progress Note Patient Details:  Lori [name] April 30, 1970 [id number]  Patient ID: [name], female   DOB: 05/31/1970, 51 y.o.   MRN: [id number]  Kennis Carina 02/14/2021, 3:01 PM Wrist splint applied to left wrist. Resting hand splint ordered from [facility]

## 2021-02-14 NOTE — Plan of Care (Signed)
°  Problem: Safety: Goal: Ability to remain free from injury will improve Outcome: Progressing   Problem: Coping: Goal: Level of anxiety will decrease Outcome: Not Progressing   Problem: Pain Managment: Goal: General experience of comfort will improve Outcome: Not Progressing

## 2021-02-14 NOTE — TOC Progression Note (Signed)
Transition of Care [facility]) - Progression Note    Patient Details  Name: Lori [name] MRN: [id number] Date of Birth: 1970-03-05  Transition of Care [facility]) CM/SW Contact  Purcell Mouton, RN Phone Number: 02/14/2021, 1:13 PM  Clinical Narrative:    SNF sent insurance request to incorrect insurance. Re-fax today to [facility].    Expected Discharge Plan: [place] Barriers to Discharge: No Barriers Identified  Expected Discharge Plan and Services Expected Discharge Plan: [place] arrangements for the past 2 months: [place] Expected Discharge Date: 02/11/21                                     Social Determinants of Health (SDOH) Interventions    Readmission Risk Interventions No flowsheet data found.

## 2021-02-14 NOTE — Progress Notes (Incomplete)
Inpatient Diabetes Program Recommendations  AACE/ADA: New Consensus Statement on Inpatient Glycemic Control (2015)  Target Ranges:  Prepandial:   less than 140 mg/dL      Peak postprandial:   less than 180 mg/dL (1-2 hours)      Critically ill patients:  140 - 180 mg/dL   Lab Results  Component Value Date   GLUCAP 223 (H) 02/14/2021   HGBA1C 10.2 (H) 01/20/2021    Review of Glycemic Control  Diabetes history: DM2 Outpatient Diabetes medications: *** Current orders for Inpatient glycemic control: Semglee 20 QHS, Novolog 0-15 units TID with meals and 0-5 HS  Inpatient Diabetes Program Recommendations:    Consider increasing Semglee to 25 units QHS  Will continue to follow glucose trends.  Thank you. Lorenda Peck, RD, LDN, CDE Inpatient Diabetes Coordinator [phone]

## 2021-02-14 NOTE — Progress Notes (Signed)
PROGRESS NOTE  Lori [name] KNL:[id number] DOB: 12-Jan-1971 DOA: 02/06/2021 PCP: Pcp, No  Brief History   51 y.o. female with medical history significant of CVA w/ residuals, HTN, DM2, hyperthyroidism, anxiety/depression, tachycardia. Presented with N/V. Urosepsis.  Condition gradually improved with empiric antibiotics and condition now stable for discharge.   The patient has been complaining of ongoing nausea and vomiting. Nursing states that emesis is small volume. Abdominal film is negative for abnormality.  Consultants  Neurology  Procedures    Antibiotics   Anti-infectives (From admission, onward)    Start     Dose/Rate Route Frequency Ordered Stop   02/11/21 0000  cefUROXime (CEFTIN) 500 MG tablet  Status:  Discontinued        500 mg Oral 2 times daily 02/11/21 1214 02/11/21    02/11/21 0000  cefUROXime (CEFTIN) 500 MG tablet        500 mg Oral 2 times daily 02/11/21 1214 02/14/21 2359   02/07/21 1145  cefTRIAXone (ROCEPHIN) 2 g in sodium chloride 0.9 % 100 mL IVPB        2 g 200 mL/hr over 30 Minutes Intravenous Every 24 hours 02/07/21 1135 02/13/21 0948       Subjective  The patient is vomiting today. She states that she has vomited 5 times today. Small volume per nursing.  Objective   Vitals:  Vitals:   02/14/21 1500 02/14/21 1526  BP:  (!) 156/107  Pulse:  (!) 106  Resp:  18  Temp: 98.7 F (37.1 C) 98.9 F (37.2 C)  SpO2:  98%    Exam:  Constitutional:  The patient is awake, alert, and oriented x 3. No acute distress. Respiratory:  No increased work of breathing. No wheezes, rales, or rhonchi No tactile fremitus Cardiovascular:  Regular rate and rhythm No murmurs, ectopy, or gallups. No lateral PMI. No thrills. Abdomen:  Abdomen is soft, non-tender, non-distended No hernias, masses, or organomegaly Normoactive bowel sounds.  Musculoskeletal:  No cyanosis, clubbing, or edema Skin:  No rashes, lesions, ulcers palpation of skin: no  induration or nodules Neurologic:  CN 2-12 intact Sensation all 4 extremities intact Psychiatric:  Mental status Mood, affect appropriate Orientation to person, place, time  judgment and insight appear intact   I have personally reviewed the following:   Today's Data  Vitals  Lab Data  CMP CBC  Micro Data  Urine culture positive for proteus mirabilis and Klebsiella pneumoniae Blood culture has had no growth.  Imaging  Abd X-ray - negative  Cardiology Data  EKG  Other Data    Scheduled Meds:  amLODipine  5 mg Oral Daily   aspirin  81 mg Oral Daily   atorvastatin  80 mg Oral Daily   clopidogrel  75 mg Oral Daily   escitalopram  20 mg Oral Daily   gabapentin  100 mg Oral TID   heparin  5,000 Units Subcutaneous Q8H   insulin aspart  0-15 Units Subcutaneous TID WC   insulin aspart  0-5 Units Subcutaneous QHS   insulin glargine-yfgn  20 Units Subcutaneous QHS   lisinopril  20 mg Oral Daily   loratadine  10 mg Oral Daily   LORazepam  0.25 mg Oral QHS   methimazole  5 mg Oral Daily   metoCLOPramide  10 mg Oral TID AC   metoprolol tartrate  100 mg Oral BID   pantoprazole  40 mg Oral Daily   polyethylene glycol  17 g Oral BID   senna  1 tablet  Oral QHS   zolpidem  5 mg Oral QHS   Continuous Infusions:  Active Problems:   Stroke [facility])   Diabetes mellitus without complication ([place])   Hyperthyroidism   HLD (hyperlipidemia)   LOS: 7 days   A & P  Sepsis (HCC)-resolved as of 02/12/2021, (present on admission) -- Secondary to UTI, sepsis symptomatology resolved.  Complete antibiotics.   Acute lower UTI-resolved as of 02/13/2021 -- Resolved, final culture data noted.   Intractable nausea and vomiting-resolved as of 02/08/2021, but is again a problem -- Ongoing today. X-ray abdomen is negative. Emesis is small volume per nursing.   Hyperthyroidism --TSH within normal limits.  Continue methimazole, metoprolol   Diabetes mellitus without complication (HCC) -- CBG  overall stable, continue SSI, glucose checks, DM diet, continue semglee    Stroke ([place])- (present on admission) --Hx of CVA w/ paraplegia, left-sided weakness, dysarthria --Continue aspirin, statin, Plavix  I have seen and examined this patient myself. I have spent 32 minutes in her evaluation and care.   DVT Prophylasix: Heparin CODE STATUS: Full Code Family Communication: None available Disposition: Home  Lori Witts, DO [facility] Direct contact: see [email or web address]  7PM-7AM contact night coverage as above 02/14/2021, 7:45 PM  LOS: 7 days

## 2021-02-15 DIAGNOSIS — N390 Urinary tract infection, site not specified: Secondary | ICD-10-CM | POA: Diagnosis not present

## 2021-02-15 DIAGNOSIS — E119 Type 2 diabetes mellitus without complications: Secondary | ICD-10-CM | POA: Diagnosis not present

## 2021-02-15 DIAGNOSIS — R112 Nausea with vomiting, unspecified: Secondary | ICD-10-CM | POA: Diagnosis not present

## 2021-02-15 DIAGNOSIS — A419 Sepsis, unspecified organism: Secondary | ICD-10-CM | POA: Diagnosis not present

## 2021-02-15 LAB — GLUCOSE, CAPILLARY
Glucose-Capillary: 159 mg/dL — ABNORMAL HIGH (ref 70–99)
Glucose-Capillary: 143 mg/dL — ABNORMAL HIGH (ref 70–99)
Glucose-Capillary: 142 mg/dL — ABNORMAL HIGH (ref 70–99)

## 2021-02-15 MED ORDER — GABAPENTIN 100 MG PO CAPS: 100.0000 mg | ORAL_CAPSULE | Freq: Three times a day (TID) | ORAL | 0 refills | Status: DC

## 2021-02-15 MED ORDER — SENNA 8.6 MG PO TABS: 1.0000 | ORAL_TABLET | Freq: Every day | ORAL | 0 refills | Status: DC

## 2021-02-15 MED ORDER — POLYETHYLENE GLYCOL 3350 17 G PO PACK: 17.0000 g | PACK | Freq: Two times a day (BID) | ORAL | 0 refills | Status: DC

## 2021-02-15 MED ORDER — CYCLOBENZAPRINE HCL 5 MG PO TABS: 5.0000 mg | ORAL_TABLET | Freq: Three times a day (TID) | ORAL | 0 refills | Status: DC | PRN

## 2021-02-15 NOTE — Progress Notes (Signed)
RN called report to [facility] at receiving facility, [facility] at 7:50pm. Awaiting PTAR.

## 2021-02-15 NOTE — Discharge Summary (Signed)
Physician Discharge Summary  Lori [name] KNL:[id number] DOB: [birth year] DOA: 02/06/2021  PCP: Pcp, No  Admit date: 02/06/2021 Discharge date: 02/15/2021  Recommendations for Outpatient Follow-up:  Discharge back to facility Follow up with PCP in 7-10 days.  Discharge Diagnoses: Principal diagnosis is #1 Intractable nausea and vomiting. Sepsis due to UT UTI Hyperthyroidism DM II History of stroke  Discharge Condition: Fair Disposition: SNF  Diet recommendation: Heart healthy  Filed Weights   02/14/21 1459  Weight: 95.6 kg    History of present illness:  Lori [name] is a 51 y.o. female with medical history significant of CVA, HTN, DM2, hyperthyroidism, anxiety/depression, tachycardia. Presenting with N/V. Recently discharged from the hospital to SNF. She felt fine her first few days in SNF. She reports yesterday she tried to eat lunch and that's when her symptoms began. She start vomiting and couldn't stop. She tried zofran, but was unable to keep it down. When her vomiting continued into the evening, the SNF sent her to the ED for evaluation.    ED Course: Noted to be tachycardic w/ elevated lactic acid. UA was dirty. She was started on rocephin. [facility] was called for admission.    Hospital Course:  51 y.o. female with medical history significant of CVA w/ residuals, HTN, DM2, hyperthyroidism, anxiety/depression, tachycardia. Presented with N/V. Urosepsis.  Condition gradually improved with empiric antibiotics.  The patient was reportedly appropriate for discharge on 02/13/2021, however on 1/11 the patient was complaining of ongoing nausea and vomiting. Nursing stated that emesis is small volume. Abdominal film is negative for abnormality.  Today the patient has not had emesis. She will be discharged to facility in fair condition.  Today's assessment: S: The patient is resting comfortably. No new complaints. O: Vitals:  Vitals:   02/14/21 2223 02/15/21 0650  BP:  (!) 137/98 (!) 147/94  Pulse: (!) 116 (!) 107  Resp: 18 20  Temp: 98.6 F (37 C) 98.6 F (37 C)  SpO2: 99% 98%    Exam:  Constitutional:  The patient is awake, alert, and oriented x 3. No acute distress. Respiratory:  No increased work of breathing. No wheezes, rales, or rhonchi No tactile fremitus Cardiovascular:  Regular rate and rhythm No murmurs, ectopy, or gallups. No lateral PMI. No thrills. Abdomen:  Abdomen is soft, non-tender, non-distended No hernias, masses, or organomegaly Normoactive bowel sounds.  Musculoskeletal:  No cyanosis, clubbing, or edema Skin:  No rashes, lesions, ulcers palpation of skin: no induration or nodules Neurologic:  CN 2-12 intact Sensation all 4 extremities intact Psychiatric:  Mental status Mood, affect appropriate Orientation to person, place, time  judgment and insight appear intact   Discharge Instructions  Discharge Instructions     Activity as tolerated - No restrictions   Complete by: As directed    Call MD for:  persistant nausea and vomiting   Complete by: As directed    Call MD for:  severe uncontrolled pain   Complete by: As directed    Diet - low sodium heart healthy   Complete by: As directed    Discharge instructions   Complete by: As directed    Discharge back to facility Follow up with PCP in 7-10 days.   Increase activity slowly   Complete by: As directed       Allergies as of 02/15/2021       Reactions   Contrast Media [iodinated Contrast Media] Swelling   Penicillins Swelling   Mouth swells up and eyes swollen shut  Sumatriptan         Medication List     STOP taking these medications    insulin detemir 100 unit/ml Soln Commonly known as: LEVEMIR       TAKE these medications    amLODipine 5 MG tablet Commonly known as: NORVASC Take 1 tablet (5 mg total) by mouth daily.   aspirin 81 MG chewable tablet Chew 1 tablet (81 mg total) by mouth daily.   atorvastatin 80 MG  tablet Commonly known as: LIPITOR Take 80 mg by mouth daily.   calcium carbonate 500 MG chewable tablet Commonly known as: TUMS - dosed in mg elemental calcium Chew 1 tablet (200 mg of elemental calcium total) by mouth daily. What changed: how much to take   clopidogrel 75 MG tablet Commonly known as: PLAVIX Take 75 mg by mouth daily.   cyclobenzaprine 5 MG tablet Commonly known as: FLEXERIL Take 1 tablet (5 mg total) by mouth 3 (three) times daily as needed for muscle spasms. What changed:  when to take this reasons to take this   escitalopram 20 MG tablet Commonly known as: LEXAPRO Take 20 mg by mouth daily.   gabapentin 100 MG capsule Commonly known as: NEURONTIN Take 100 mg by mouth daily. What changed: Another medication with the same name was added. Make sure you understand how and when to take each.   gabapentin 100 MG capsule Commonly known as: NEURONTIN Take 1 capsule (100 mg total) by mouth 3 (three) times daily. What changed: You were already taking a medication with the same name, and this prescription was added. Make sure you understand how and when to take each.   insulin glargine 100 UNIT/ML injection Commonly known as: LANTUS Inject 0.25 mLs (25 Units total) into the skin daily. What changed: how much to take   lisinopril 20 MG tablet Commonly known as: ZESTRIL Take 1 tablet (20 mg total) by mouth daily.   loratadine 10 MG tablet Commonly known as: CLARITIN Take 1 tablet (10 mg total) by mouth daily.   LORazepam 0.5 MG tablet Commonly known as: ATIVAN Take 0.5 tablets (0.25 mg total) by mouth at bedtime.   melatonin 3 MG Tabs tablet Take 9 mg by mouth at bedtime.   methimazole 5 MG tablet Commonly known as: TAPAZOLE Take 1 tablet (5 mg total) by mouth daily.   metoCLOPramide 10 MG tablet Commonly known as: REGLAN Take 1 tablet (10 mg total) by mouth 3 (three) times daily before meals.   metoprolol tartrate 100 MG tablet Commonly known as:  LOPRESSOR Take 1 tablet (100 mg total) by mouth 2 (two) times daily.   ondansetron 4 MG tablet Commonly known as: ZOFRAN Take 4 mg by mouth once.   pantoprazole 40 MG tablet Commonly known as: PROTONIX Take 1 tablet (40 mg total) by mouth daily.   polyethylene glycol 17 g packet Commonly known as: MIRALAX / GLYCOLAX Take 17 g by mouth 2 (two) times daily. What changed: when to take this   senna 8.6 MG Tabs tablet Commonly known as: SENOKOT Take 1 tablet (8.6 mg total) by mouth at bedtime.   zolpidem 5 MG tablet Commonly known as: AMBIEN Take 1 tablet (5 mg total) by mouth at bedtime as needed for sleep. What changed: when to take this       ASK your doctor about these medications    cefUROXime 500 MG tablet Commonly known as: CEFTIN Take 1 tablet (500 mg total) by mouth 2 (two) times daily for  3 days. Start 1/9 AM. Has tolerated 5 days of ceftriaxone. Ask about: Should I take this medication?       Allergies  Allergen Reactions   Contrast Media [Iodinated Contrast Media] Swelling   Penicillins Swelling    Mouth swells up and eyes swollen shut   Sumatriptan     The results of significant diagnostics from this hospitalization (including imaging, microbiology, ancillary and laboratory) are listed below for reference.    Significant Diagnostic Studies: CT ABDOMEN PELVIS WO CONTRAST  Result Date: 01/19/2021 CLINICAL DATA:  Nausea, vomiting, abdominal pain. EXAM: CT ABDOMEN AND PELVIS WITHOUT CONTRAST TECHNIQUE: Multidetector CT imaging of the abdomen and pelvis was performed following the standard protocol without IV contrast. COMPARISON:  None. FINDINGS: Lower chest: Small sliding-type hiatal hernia. Visualized lung bases are unremarkable. Hepatobiliary: No focal liver abnormality is seen. No gallstones, gallbladder wall thickening, or biliary dilatation. Pancreas: Unremarkable. No pancreatic ductal dilatation or surrounding inflammatory changes. Spleen: Normal in size  without focal abnormality. Adrenals/Urinary Tract: Adrenal glands are unremarkable. Kidneys are normal, without renal calculi, focal lesion, or hydronephrosis. Bladder is unremarkable. Stomach/Bowel: Stomach is within normal limits. Appendix appears normal. No evidence of bowel wall thickening, distention, or inflammatory changes. Vascular/Lymphatic: Aortic atherosclerosis. No enlarged abdominal or pelvic lymph nodes. Reproductive: Patient appears to be status post hysterectomy. No definite adnexal abnormality is noted. 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 Other: No abdominal wall hernia or abnormality. No abdominopelvic ascites. Musculoskeletal: No acute or significant osseous findings. IMPRESSION: Patient appears to be status post hysterectomy. 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 Small sliding-type hiatal hernia. Aortic Atherosclerosis (ICD10-I70.0). Electronically Signed   By: Marijo Conception M.D.   On: 01/19/2021 13:21   DG Abd 1 View  Result Date: 02/14/2021 CLINICAL DATA:  Nausea and vomiting EXAM: ABDOMEN - 1 VIEW COMPARISON:  February 19, 2021 FINDINGS: Right abdominal and central pelvic foci of hyperattenuation are likely contrast within the colon. The majority of the contrast has passed since 2021-02-19. No gaseous distention of bowel loops. No gross free intraperitoneal air. Vascular calcifications. IMPRESSION: No acute findings. Electronically Signed   By: Abigail Miyamoto M.D.   On: 02/14/2021 11:18   DG Abd 1 View  Result Date: 02-19-2021 CLINICAL DATA:  Intractable nausea and vomiting. EXAM: ABDOMEN - 1 VIEW COMPARISON:  01/24/2021 FINDINGS: A small amount of residual oral contrast material is scattered throughout the colon and rectum, much less than on the prior radiographs. Colonic diverticulosis is again noted. No dilated loops of bowel are seen to suggest obstruction. Right upper  quadrant abdominal surgical clips and sternal wires are noted. No acute osseous abnormality is seen. IMPRESSION: No evidence of bowel obstruction. Electronically Signed   By: Logan Bores M.D.   On: 2021-02-19 16:42   CT HEAD WO CONTRAST (5MM)  Result Date: 01/24/2021 CLINICAL DATA:  Stroke, follow up nausea vomiting EXAM: CT HEAD WITHOUT CONTRAST TECHNIQUE: Contiguous axial images were obtained from the base of the skull through the vertex without intravenous contrast. COMPARISON:  MRI head 01/20/2021 FINDINGS: Brain: Redemonstration of chronic bilateral basal ganglia, left thalami, and bilateral cerebellar infarctions. No evidence of large-territorial acute infarction. No parenchymal hemorrhage. No mass lesion. No extra-axial collection. No mass effect or midline shift. No hydrocephalus. Basilar cisterns are patent. Vascular: No hyperdense vessel. Atherosclerotic calcifications are present within the cavernous internal carotid and vertebral arteries. Skull: No acute fracture or focal lesion. Sinuses/Orbits: Paranasal  sinuses and mastoid air cells are clear. The orbits are unremarkable. Other: None. IMPRESSION: No acute intracranial abnormality in a patient with multiple prior infarctions. Electronically Signed   By: Iven Finn M.D.   On: 01/24/2021 16:20   CT SOFT TISSUE NECK WO CONTRAST  Result Date: 01/21/2021 CLINICAL DATA:  Soft tissue swelling, infection suspected EXAM: CT NECK WITHOUT CONTRAST TECHNIQUE: Multidetector CT imaging of the neck was performed following the standard protocol without intravenous contrast. COMPARISON:  CTA head neck 01/19/2021 FINDINGS: Pharynx and larynx: The nasal cavity and nasopharynx are normal. The oropharynx and oral cavity are normal. The hypopharynx and larynx are normal. There is no abnormal soft tissue mass or fluid collection, within the confines of noncontrast technique. The parapharyngeal spaces are clear. The airway is patent. Salivary glands: Parotid  and submandibular glands are normal. Thyroid: Unremarkable. Lymph nodes: There is no pathologic lymphadenopathy in the neck. Vascular: There is calcified atherosclerotic plaque in the bilateral carotid bulbs. Limited intracranial: The imaged portions of the intracranial compartment are unremarkable. Visualized orbits: The globes and orbits are unremarkable. Mastoids and visualized paranasal sinuses: Paranasal sinuses and mastoid air cells are clear. Skeleton: There is no significant degenerative change of the cervical spine. There is no acute osseous abnormality or aggressive osseous lesion. There is no visible canal hematoma Upper chest: . the imaged lung apices are clear. Other: None. IMPRESSION: Unremarkable noncontrast CT of the neck with no acute finding to explain the patient's symptoms. Electronically Signed   By: Valetta Mole M.D.   On: 01/21/2021 16:38   MR BRAIN WO CONTRAST  Result Date: 01/20/2021 CLINICAL DATA:  Left-sided weakness, stroke suspected EXAM: MRI HEAD WITHOUT CONTRAST TECHNIQUE: Multiplanar, multiecho pulse sequences of the brain and surrounding structures were obtained without intravenous contrast. COMPARISON:  CT/CTA head and neck dated 1 day prior FINDINGS: Brain: There is no evidence of acute intracranial hemorrhage, extra-axial fluid collection, or acute infarct. There is a background of moderate parenchymal volume loss, advanced for age. There are remote infarcts in the bilateral basal ganglia, thalami, right cerebral peduncle, and periventricular white matter. There is a remote infarct in the left cerebellar hemisphere. Additional confluent FLAIR signal abnormality in the subcortical and periventricular white matter likely reflects sequela of chronic white matter microangiopathy, also advanced for age. There are multiple small chronic microhemorrhages in a central distribution, likely hypertensive in etiology. There is no solid mass lesion.  There is no midline shift. Vascular:  Normal flow voids. Skull and upper cervical spine: Normal marrow signal. Sinuses/Orbits: The paranasal sinuses are clear. The globes and orbits are unremarkable. Other: None. IMPRESSION: 1. No acute intracranial pathology. 2. Moderate parenchymal volume loss and chronic white matter microangiopathy, advanced for age. 3. 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 4. Multiple small chronic microhemorrhages in a central distribution, likely hypertensive in etiology. Electronically Signed   By: Valetta Mole M.D.   On: 01/20/2021 14:31   MR THORACIC SPINE WO CONTRAST  Result Date: 01/21/2021 CLINICAL DATA:  Demyelinating disease EXAM: MRI THORACIC SPINE WITHOUT CONTRAST TECHNIQUE: Multiplanar, multisequence MR imaging of the thoracic spine was performed. No intravenous contrast was administered. COMPARISON:  None. FINDINGS: Evaluation is somewhat limited by motion artifact. Alignment:  Physiologic. Vertebrae: No fracture, evidence of discitis, or bone lesion. Cord:  Normal signal and morphology. Paraspinal and other soft tissues: Small right pleural effusion. Disc levels: T6-T7: Small central disc protrusion. No spinal canal stenosis or neural foraminal narrowing.  T11-T12: Small left paracentral disc protrusion. No spinal canal stenosis or neural foraminal narrowing. Other thoracic vertebral levels are negative. IMPRESSION: 1. No evidence of demyelinating disease in the thoracic spinal cord. 2. No spinal canal stenosis or neural foraminal narrowing. Electronically Signed   By: Merilyn Baba M.D.   On: 01/21/2021 22:22   DG Chest Portable 1 View  Result Date: 02/07/2021 CLINICAL DATA:  febrile EXAM: PORTABLE CHEST 1 VIEW COMPARISON:  January 19, 2021 FINDINGS: The cardiomediastinal silhouette is unchanged in contour.Status post median sternotomy. Atherosclerotic calcifications of the aorta. No pleural effusion. No pneumothorax. No acute  pleuroparenchymal abnormality. Visualized abdomen is unremarkable. Incidental note of an azygous fissure. IMPRESSION: No acute cardiopulmonary abnormality. Electronically Signed   By: Valentino Saxon M.D.   On: 02/07/2021 07:48   DG Chest Port 1 View  Result Date: 01/19/2021 CLINICAL DATA:  Sepsis. EXAM: PORTABLE CHEST 1 VIEW COMPARISON:  None. FINDINGS: The heart size and mediastinal contours are within normal limits. Both lungs are clear. Sternotomy wires are noted. The visualized skeletal structures are unremarkable. IMPRESSION: No active disease. Electronically Signed   By: Marijo Conception M.D.   On: 01/19/2021 09:18   DG Abd 2 Views  Result Date: 01/24/2021 CLINICAL DATA:  Nausea and vomiting EXAM: ABDOMEN - 2 VIEW COMPARISON:  CT abdomen pelvis 01/19/2021 FINDINGS: The bowel gas pattern is normal. There is no evidence of free air. PO contrast reaches the sigmoid colon. No extravasation of the PO contrast noted within the intraperitoneal space. Colonic diverticulosis. No radio-opaque calculi or other significant radiographic abnormality is seen. Vascular calcification. IMPRESSION: 1. PO contrast reaches the sigmoid colon. 2. Colonic diverticulosis. 3.  Aortic Atherosclerosis (ICD10-I70.0). Electronically Signed   By: Iven Finn M.D.   On: 01/24/2021 16:51   DG Swallowing Func-Speech Pathology  Result Date: 01/31/2021 Table formatting from the original result was not included. SLP Summary Report below; please see Notes for full report. Assessment / Plan / Recommendation   Clinical Impressions 01/31/2021 Clinical Impression Patient continues with moderate oropharyngeal dysphagia without changes in deficits of swallow timing, sensation, or airway protection as seen on MBS 01/23/21. Pt reports feeling of "throat swelling" has subsided, and lip swelling has reduced significantly. RN reports pt has been non-compliant with liquid recommendations; has been refusing pills unless given whole (vs  crushed) in puree, and eating [place] ices despite education on liquid precautions. Pt reports onset of left-sided chest pain when coughing. Per review of vitals, has been afebrile. Hoarse vocal quality has improved, however pt with ongoing hypophonia as well as slight hypernasal quality to resonance. Along with impaired timing of swallow initiation and impaired laryngeal sensation, this may indicate CN X impairments. ENT consult may assist in further assessment of laryngeal function pertaining to both swallow and voicing. Oral stage of swallowing is characterized by adequate bolus hold but slow, disorganized lingual transport. Mastication with solids occurs with munching pattern (anterior) vs rotary chewing given absent dentition. Swallow initiation is delayed to the level of the pyriform sinuses with thin liquids. There is deep laryngeal penetration during the swallow with thin liquids, with silent aspiration post-swallow (one instance of delayed sensation). Cued cough: patient was unable to follow the command consistently and was unable to clear the airway when she did follow this command. Airway protection is improved with nectar thick liquids (one instance of trace shallow penetration which cleared with throat clear, subsequent swallow). Compensatory maneuvers attempted with thins included bolus hold, chin tuck, and supraglottic swallow, however these  did not prevent aspiration with thin liquids. Given pt non-compliance with pills crushed, assessed with 13 mm barium tablet whole in puree. Pt able to clear this without difficulty. Does appear to have slightly improved base of tongue retraction, hyolaryngeal excursion, as there was minimal pharyngeal residue post-swallow (mild vallecular after puree, solid). Noted with slowed clearance through the cervical esophagus with intermittent retrograde flow with al consistencies, which remained below the level of the pharyngoesophageal segment. After the study pt was  educated regarding findings and recommendation for continuing dysphagia 3, nectar liquids given acute illness, decreased functional reserve, and risk for aspiration. Patient was at times verbalized resistance, intent for non-compliance. Encouraged pt to follow recommendations at least as a temporary measure until medical status improves and she is able to begin working on rehabilitation. Educated provided pt that SLP simply makes recommendation, and that she may choose not to follow recommendations however this increases risk for aspiration, aspiration pneumonia, increased length of stay, and worsening respiratory status. Patient verbalized understanding and expressed desire to comply with recommendations. Nsg updated via secure chat.  SLP Visit Diagnosis Dysphagia, oropharyngeal phase (R13.12) Impact on safety and function Moderate aspiration risk;Risk for inadequate nutrition/hydration Deneise Lever, MS, CCC-SLP Speech-Language Pathologist    EEG adult  Result Date: 01/22/2021 Derek Jack, MD     01/22/2021  9:15 PM Routine EEG Report Lori [name] is a 51 y.o. female with a history of strokes and aphasia who is undergoing an EEG to evaluate for seizures. Report: This EEG was acquired with electrodes placed according to the International 10-20 electrode system (including Fp1, Fp2, F3, F4, C3, C4, P3, P4, O1, O2, T3, T4, T5, T6, A1, A2, Fz, Cz, Pz). The following electrodes were missing or displaced: none. The occipital dominant rhythm was 7-8 Hz. This activity is reactive to stimulation. Drowsiness was manifested by background fragmentation; deeper stages of sleep were identified by K complexes and sleep spindles. There was subtle focal slowing over the left frontal region. There were no interictal epileptiform discharges. There were no electrographic seizures identified. There was no abnormal response to photic stimulation. Hyperventilation was not performed. Impression and clinical  correlation: This EEG was obtained while awake and asleep and is abnormal due to mild diffuse slowing indicative of global cerebral dysfunction. Subtle left frontal focal slowing indicated superimposed focal dysfunction in that area. Epileptiform abnormalities were not seen during this recording. Su Monks, MD [facility] [phone] If 7pm- 7am, please page neurology on call as listed in [place].   ECHOCARDIOGRAM COMPLETE  Result Date: 01/21/2021    ECHOCARDIOGRAM REPORT   Patient Name:   Lori [name] Date of Exam: 01/21/2021 Medical Rec #:  [id number]           Height:       62.0 in Accession #:    [id number]          Weight:       215.4 lb Date of Birth:  01/05/1971            BSA:          1.973 m Patient Age:    63 years            BP:           124/89 mmHg Patient Gender: F                   HR:           107 bpm. Exam Location:  [facility] Procedure: 2D Echo Indications:     Stroke I63.9  History:         Patient has no prior history of Echocardiogram examinations.  Sonographer:     Kathlen Brunswick RDCS Referring Phys:  [id number] Soledad Gerlach NIU Diagnosing Phys: Ida Rogue MD  Sonographer Comments: Technically challenging study due to limited acoustic windows, suboptimal subcostal window and suboptimal apical window. Image acquisition challenging due to patient body habitus. IMPRESSIONS  1. Left ventricular ejection fraction, by estimation, is 60 to 65%. The left ventricle has normal function. The left ventricle has no regional wall motion abnormalities. There is moderate left ventricular hypertrophy. Left ventricular diastolic parameters are consistent with Grade I diastolic dysfunction (impaired relaxation).  2. Right ventricular systolic function is normal. The right ventricular size is normal.  3. The mitral valve is normal in structure. No evidence of mitral valve regurgitation. No evidence of mitral stenosis.  4. Tricuspid valve regurgitation is mild to moderate.  5. The aortic valve is  normal in structure. Aortic valve regurgitation is not visualized. Aortic valve sclerosis is present, with no evidence of aortic valve stenosis.  6. The inferior vena cava is normal in size with greater than 50% respiratory variability, suggesting right atrial pressure of 3 mmHg. FINDINGS  Left Ventricle: Left ventricular ejection fraction, by estimation, is 60 to 65%. The left ventricle has normal function. The left ventricle has no regional wall motion abnormalities. The left ventricular internal cavity size was normal in size. There is  moderate left ventricular hypertrophy. Left ventricular diastolic parameters are consistent with Grade I diastolic dysfunction (impaired relaxation). Right Ventricle: The right ventricular size is normal. No increase in right ventricular wall thickness. Right ventricular systolic function is normal. Left Atrium: Left atrial size was normal in size. Right Atrium: Right atrial size was normal in size. Pericardium: There is no evidence of pericardial effusion. Mitral Valve: The mitral valve is normal in structure. No evidence of mitral valve regurgitation. No evidence of mitral valve stenosis. Tricuspid Valve: The tricuspid valve is normal in structure. Tricuspid valve regurgitation is mild to moderate. No evidence of tricuspid stenosis. Aortic Valve: The aortic valve is normal in structure. Aortic valve regurgitation is not visualized. Aortic valve sclerosis is present, with no evidence of aortic valve stenosis. Aortic valve peak gradient measures 8.0 mmHg. Pulmonic Valve: The pulmonic valve was normal in structure. Pulmonic valve regurgitation is not visualized. No evidence of pulmonic stenosis. Aorta: The aortic root is normal in size and structure. Venous: The inferior vena cava is normal in size with greater than 50% respiratory variability, suggesting right atrial pressure of 3 mmHg. IAS/Shunts: No atrial level shunt detected by color flow Doppler.  LEFT VENTRICLE PLAX 2D  LVIDd:         3.00 cm   Diastology LVIDs:         2.10 cm   LV e' medial:    6.20 cm/s LV PW:         1.50 cm   LV E/e' medial:  14.3 LV IVS:        1.30 cm   LV e' lateral:   5.98 cm/s LVOT diam:     1.80 cm   LV E/e' lateral: 14.8 LV SV:         42 LV SV Index:   22 LVOT Area:     2.54 cm  LEFT ATRIUM         Index LA diam:    3.10 cm 1.57 cm/m  AORTIC VALVE                 PULMONIC VALVE AV Area (Vmax): 1.53 cm     PV Vmax:       1.18 m/s AV Vmax:        141.00 cm/s  PV Peak grad:  5.6 mmHg AV Peak Grad:   8.0 mmHg LVOT Vmax:      84.90 cm/s LVOT Vmean:     60.700 cm/s LVOT VTI:       0.167 m  AORTA Ao Root diam: 2.50 cm Ao Asc diam:  2.30 cm MITRAL VALVE                TRICUSPID VALVE MV Area (PHT): 6.43 cm     TV Peak grad:   19.2 mmHg MV Decel Time: 118 msec     TV Vmax:        2.19 m/s MV E velocity: 88.70 cm/s MV A velocity: 105.00 cm/s  SHUNTS MV E/A ratio:  0.84         Systemic VTI:  0.17 m                             Systemic Diam: 1.80 cm Ida Rogue MD Electronically signed by Ida Rogue MD Signature Date/Time: 01/21/2021/7:03:01 PM    Final    CT HEAD CODE STROKE WO CONTRAST  Result Date: 01/19/2021 CLINICAL DATA:  Code stroke. Generalized weakness. Aphasia and left-sided deficits from prior stroke EXAM: CT HEAD WITHOUT CONTRAST TECHNIQUE: Contiguous axial images were obtained from the base of the skull through the vertex without intravenous contrast. COMPARISON:  None. FINDINGS: Brain: Confluent low-density in the cerebral white matter with superimposed chronic lacunar infarcts at the deep gray nuclei and deep white matter tracks. 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 Premature brain atrophy. No acute hemorrhage, hydrocephalus, collection, or masslike finding. Brain atrophy especially affecting the cerebellum. Vascular: No hyperdense vessel. Premature atheromatous calcification. Skull: Normal. Negative for fracture or focal lesion. Sinuses/Orbits: No acute  finding. Other: These results were called by telephone at the time of interpretation on 01/19/2021 at 7:59 am to provider Merlyn Lot , who verbally acknowledged these results. ASPECTS [facility] Stroke Program Early CT Score) No acute infarct. IMPRESSION: 1. No acute finding. 2. 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 Electronically Signed   By: Jorje Guild M.D.   On: 01/19/2021 08:01   CT ANGIO HEAD CODE STROKE  Result Date: 01/19/2021 CLINICAL DATA:  Generalized weakness with aphasia. Left-sided deficits. Vomiting. EXAM: CT ANGIOGRAPHY HEAD AND NECK TECHNIQUE: Multidetector CT imaging of the head and neck was performed using the standard protocol during bolus administration of intravenous contrast. Multiplanar CT image reconstructions and MIPs were obtained to evaluate the vascular anatomy. Carotid stenosis measurements (when applicable) are obtained utilizing NASCET criteria, using the distal internal carotid diameter as the denominator. CONTRAST:  64mL OMNIPAQUE IOHEXOL 350 MG/ML SOLN COMPARISON:  None. FINDINGS: CTA NECK FINDINGS Aortic arch: Atheromatous plaque. 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 No dissection or ulceration. Left carotid system: Age advanced atheromatous plaque along the common carotid and proximal ICA without flow limiting stenosis or ulceration. Vertebral arteries: Aberrant right subclavian artery. Significant for age atheromatous plaque at the bilateral proximal subclavian,  but without flow limiting stenosis. Heavily diseased vertebral arteries with extensive calcific plaque and multifocal narrowing. The origins are difficult to assess due to small vessel size, calcified plaque, and body habitus. Both vertebral arteries are patent at the dura although there is  subsequent occlusion on the left. Skeleton: No acute finding Other neck: No acute finding Upper chest: No acute finding Review of the MIP images confirms the above findings CTA HEAD FINDINGS Anterior circulation: Heavily diseased carotid siphon with calcified plaque and multifocal luminal stenosis. The degree of calcified plaque limits lumen measurement, expect flow limiting stenosis on both sides. 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 Extensive atheromatous plaque affecting the M1 segments and MCA branches. Moderate narrowing at the bilateral M1 segment. Negative for aneurysm. Posterior circulation: Heavily diseased vertebral arteries with calcified plaque. Severe right vertebral stenosis beyond the PICA. Left proximal V4 segment occlusion with potentially retrograde flow seen at the proximal left PICA. Narrow basilar diffusely with 2 segments of non enhancement. Fetal type bilateral PCA flow. Venous sinuses: No emergent finding Anatomic variants: As above Review of the MIP images confirms the above findings Critical Value/emergent results were called by telephone at the time of interpretation on 01/19/2021 at 8:42 am to provider ERIC [facility] , who verbally acknowledged these results. IMPRESSION: 1. Generalized and severe atherosclerosis, especially for age. 2. Dominant findings in the posterior circulation where there are left vertebral and basilar occlusions which are age indeterminate. Severe right V4 segment stenosis. 3. No flow limiting stenosis in the cervical carotid circulation. 4. Probable flow limiting stenosis at the carotid siphons, especially on the right. Electronically Signed   By: Jorje Guild M.D.   On: 01/19/2021 08:50   CT ANGIO NECK CODE STROKE  Result Date: 01/19/2021 CLINICAL DATA:  Generalized weakness with aphasia. Left-sided deficits. Vomiting. EXAM: CT ANGIOGRAPHY HEAD AND NECK TECHNIQUE: Multidetector CT imaging of the head and neck was performed using  the standard protocol during bolus administration of intravenous contrast. Multiplanar CT image reconstructions and MIPs were obtained to evaluate the vascular anatomy. Carotid stenosis measurements (when applicable) are obtained utilizing NASCET criteria, using the distal internal carotid diameter as the denominator. CONTRAST:  32mL OMNIPAQUE IOHEXOL 350 MG/ML SOLN COMPARISON:  None. FINDINGS: CTA NECK FINDINGS Aortic arch: Atheromatous plaque. 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 No dissection or ulceration. Left carotid system: Age advanced atheromatous plaque along the common carotid and proximal ICA without flow limiting stenosis or ulceration. Vertebral arteries: Aberrant right subclavian artery. Significant for age atheromatous plaque at the bilateral proximal subclavian, but without flow limiting stenosis. Heavily diseased vertebral arteries with extensive calcific plaque and multifocal narrowing. The origins are difficult to assess due to small vessel size, calcified plaque, and body habitus. Both vertebral arteries are patent at the dura although there is subsequent occlusion on the left. Skeleton: No acute finding Other neck: No acute finding Upper chest: No acute finding Review of the MIP images confirms the above findings CTA HEAD FINDINGS Anterior circulation: Heavily diseased carotid siphon with calcified plaque and multifocal luminal stenosis. The degree of calcified plaque limits lumen measurement, expect flow limiting stenosis on both sides. 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 Extensive atheromatous plaque affecting the M1 segments and MCA branches. Moderate narrowing at the bilateral M1 segment. Negative  for aneurysm. Posterior circulation: Heavily diseased vertebral  arteries with calcified plaque. Severe right vertebral stenosis beyond the PICA. Left proximal V4 segment occlusion with potentially retrograde flow seen at the proximal left PICA. Narrow basilar diffusely with 2 segments of non enhancement. Fetal type bilateral PCA flow. Venous sinuses: No emergent finding Anatomic variants: As above Review of the MIP images confirms the above findings Critical Value/emergent results were called by telephone at the time of interpretation on 01/19/2021 at 8:42 am to provider ERIC [facility] , who verbally acknowledged these results. IMPRESSION: 1. Generalized and severe atherosclerosis, especially for age. 2. Dominant findings in the posterior circulation where there are left vertebral and basilar occlusions which are age indeterminate. Severe right V4 segment stenosis. 3. No flow limiting stenosis in the cervical carotid circulation. 4. Probable flow limiting stenosis at the carotid siphons, especially on the right. Electronically Signed   By: Jorje Guild M.D.   On: 01/19/2021 08:50    Microbiology: Recent Results (from the past 240 hour(s))  Resp Panel by RT-PCR (Flu A&B, Covid) Nasopharyngeal Swab     Status: None   Collection Time: 02/07/21  6:35 AM   Specimen: Nasopharyngeal Swab; Nasopharyngeal(NP) swabs in vial transport medium  Result Value Ref Range Status   SARS Coronavirus 2 by RT PCR NEGATIVE NEGATIVE Final    Comment: (NOTE) SARS-CoV-2 target nucleic acids are NOT DETECTED.  The SARS-CoV-2 RNA is generally detectable in upper respiratory specimens during the acute phase of infection. The lowest concentration of SARS-CoV-2 viral copies this assay can detect is 138 copies/mL. A negative result does not preclude SARS-Cov-2 infection and should not be used as the sole basis for treatment or other patient management decisions. A negative result may occur with  improper specimen collection/handling, submission of specimen other than nasopharyngeal swab,  presence of viral mutation(s) within the areas targeted by this assay, and inadequate number of viral copies(<138 copies/mL). A negative result must be combined with clinical observations, patient history, and epidemiological information. The expected result is Negative.  Fact Sheet for Patients:  EntrepreneurPulse.com.au  Fact Sheet for Healthcare Providers:  IncredibleEmployment.be  This test is no t yet approved or cleared by the [place] FDA and  has been authorized for detection and/or diagnosis of SARS-CoV-2 by FDA under an Emergency Use Authorization (EUA). This EUA will remain  in effect (meaning this test can be used) for the duration of the COVID-19 declaration under Section 564(b)(1) of the Act, 21 U.S.C.section 360bbb-3(b)(1), unless the authorization is terminated  or revoked sooner.       Influenza A by PCR NEGATIVE NEGATIVE Final   Influenza B by PCR NEGATIVE NEGATIVE Final    Comment: (NOTE) The Xpert Xpress SARS-CoV-2/FLU/RSV plus assay is intended as an aid in the diagnosis of influenza from Nasopharyngeal swab specimens and should not be used as a sole basis for treatment. Nasal washings and aspirates are unacceptable for Xpert Xpress SARS-CoV-2/FLU/RSV testing.  Fact Sheet for Patients: EntrepreneurPulse.com.au  Fact Sheet for Healthcare Providers: IncredibleEmployment.be  This test is not yet approved or cleared by the [place] FDA and has been authorized for detection and/or diagnosis of SARS-CoV-2 by FDA under an Emergency Use Authorization (EUA). This EUA will remain in effect (meaning this test can be used) for the duration of the COVID-19 declaration under Section 564(b)(1) of the Act, 21 U.S.C. section 360bbb-3(b)(1), unless the authorization is terminated or revoked.  Performed at [facility], [place] [address], [place], [place] [address]  Urine  Culture     Status: Abnormal   Collection Time: 02/07/21  6:50 AM   Specimen: Urine, Clean Catch  Result Value Ref Range Status   Specimen Description   Final    URINE, CLEAN CATCH Performed at [facility], [place] [address], [place], [place] [address]    Special Requests   Final    NONE Performed at [facility], [place] [address], [place], [place] [address]    Culture (A)  Final    >=100,000 COLONIES/mL PROTEUS MIRABILIS >=100,000 COLONIES/mL KLEBSIELLA PNEUMONIAE    Report Status 02/11/2021 FINAL  Final   Organism ID, Bacteria PROTEUS MIRABILIS (A)  Final   Organism ID, Bacteria KLEBSIELLA PNEUMONIAE (A)  Final      Susceptibility   Klebsiella pneumoniae - MIC*    AMPICILLIN RESISTANT Resistant     CEFAZOLIN <=4 SENSITIVE Sensitive     CEFEPIME <=0.12 SENSITIVE Sensitive     CEFTRIAXONE <=0.25 SENSITIVE Sensitive     CIPROFLOXACIN <=0.25 SENSITIVE Sensitive     GENTAMICIN <=1 SENSITIVE Sensitive     IMIPENEM <=0.25 SENSITIVE Sensitive     NITROFURANTOIN 64 INTERMEDIATE Intermediate     TRIMETH/SULFA <=20 SENSITIVE Sensitive     AMPICILLIN/SULBACTAM <=2 SENSITIVE Sensitive     PIP/TAZO <=4 SENSITIVE Sensitive     * >=100,000 COLONIES/mL KLEBSIELLA PNEUMONIAE   Proteus mirabilis - MIC*    AMPICILLIN <=2 SENSITIVE Sensitive     CEFAZOLIN <=4 SENSITIVE Sensitive     CEFEPIME <=0.12 SENSITIVE Sensitive     CEFTRIAXONE <=0.25 SENSITIVE Sensitive     CIPROFLOXACIN <=0.25 SENSITIVE Sensitive     GENTAMICIN <=1 SENSITIVE Sensitive     IMIPENEM 2 SENSITIVE Sensitive     NITROFURANTOIN 128 RESISTANT Resistant     TRIMETH/SULFA <=20 SENSITIVE Sensitive     AMPICILLIN/SULBACTAM <=2 SENSITIVE Sensitive     PIP/TAZO <=4 SENSITIVE Sensitive     * >=100,000 COLONIES/mL PROTEUS MIRABILIS  Culture, blood (single)     Status: None   Collection Time: 02/07/21 12:15 PM   Specimen: BLOOD  Result Value Ref Range Status   Specimen Description   Final     BLOOD BLOOD LEFT FOREARM Performed at [place] [address], [place], [place] [address]    Special Requests   Final    BOTTLES DRAWN AEROBIC AND ANAEROBIC Blood Culture adequate volume Performed at [place] [address] [place], [place] [address]    Culture   Final    NO GROWTH 5 DAYS Performed at [facility], [place] [address], [place], [place] [address]    Report Status 02/12/2021 FINAL  Final  Resp Panel by RT-PCR (Flu A&B, Covid) Nasopharyngeal Swab     Status: None   Collection Time: 02/11/21  1:17 PM   Specimen: Nasopharyngeal Swab; Nasopharyngeal(NP) swabs in vial transport medium  Result Value Ref Range Status   SARS Coronavirus 2 by RT PCR NEGATIVE NEGATIVE Final    Comment: (NOTE) SARS-CoV-2 target nucleic acids are NOT DETECTED.  The SARS-CoV-2 RNA is generally detectable in upper respiratory specimens during the acute phase of infection. The lowest concentration of SARS-CoV-2 viral copies this assay can detect is 138 copies/mL. A negative result does not preclude SARS-Cov-2 infection and should not be used as the sole basis for treatment or other patient management decisions. A negative result may occur with  improper specimen collection/handling, submission of specimen other than nasopharyngeal swab, presence of viral mutation(s)  within the areas targeted by this assay, and inadequate number of viral copies(<138 copies/mL). A negative result must be combined with clinical observations, patient history, and epidemiological information. The expected result is Negative.  Fact Sheet for Patients:  EntrepreneurPulse.com.au  Fact Sheet for Healthcare Providers:  IncredibleEmployment.be  This test is no t yet approved or cleared by the [place] FDA and  has been authorized for detection and/or diagnosis of SARS-CoV-2 by FDA under an Emergency Use Authorization (EUA). This  EUA will remain  in effect (meaning this test can be used) for the duration of the COVID-19 declaration under Section 564(b)(1) of the Act, 21 U.S.C.section 360bbb-3(b)(1), unless the authorization is terminated  or revoked sooner.       Influenza A by PCR NEGATIVE NEGATIVE Final   Influenza B by PCR NEGATIVE NEGATIVE Final    Comment: (NOTE) The Xpert Xpress SARS-CoV-2/FLU/RSV plus assay is intended as an aid in the diagnosis of influenza from Nasopharyngeal swab specimens and should not be used as a sole basis for treatment. Nasal washings and aspirates are unacceptable for Xpert Xpress SARS-CoV-2/FLU/RSV testing.  Fact Sheet for Patients: EntrepreneurPulse.com.au  Fact Sheet for Healthcare Providers: IncredibleEmployment.be  This test is not yet approved or cleared by the [place] FDA and has been authorized for detection and/or diagnosis of SARS-CoV-2 by FDA under an Emergency Use Authorization (EUA). This EUA will remain in effect (meaning this test can be used) for the duration of the COVID-19 declaration under Section 564(b)(1) of the Act, 21 U.S.C. section 360bbb-3(b)(1), unless the authorization is terminated or revoked.  Performed at [facility], [place] [address], [place], [place] [address]      Labs: Basic Metabolic Panel: Recent Labs  Lab 02/12/21 0412 02/14/21 1114  NA 134* 135  K 4.0 3.8  CL 102 100  CO2 23 23  GLUCOSE 109* 238*  BUN 19 8  CREATININE 0.86 0.67  CALCIUM 8.2* 8.9   Liver Function Tests: Recent Labs  Lab 02/14/21 1114  AST 13*  ALT 16  ALKPHOS 94  BILITOT 0.9  PROT 7.8  ALBUMIN 3.3*   No results for input(s): LIPASE, AMYLASE in the last 168 hours. No results for input(s): AMMONIA in the last 168 hours. CBC: Recent Labs  Lab 02/14/21 1114  WBC 11.8*  NEUTROABS 8.7*  HGB 12.8  HCT 37.5  MCV 80.5  PLT 420*   Cardiac Enzymes: No results for input(s): CKTOTAL,  CKMB, CKMBINDEX, TROPONINI in the last 168 hours. BNP: BNP (last 3 results) No results for input(s): BNP in the last 8760 hours.  ProBNP (last 3 results) No results for input(s): PROBNP in the last 8760 hours.  CBG: Recent Labs  Lab 02/14/21 1452 02/14/21 1652 02/14/21 2220 02/15/21 0735 02/15/21 1149  GLUCAP 191* 175* 175* 159* 143*    Active Problems:   Stroke (HCC)   Diabetes mellitus without complication (HCC)   Hyperthyroidism   HLD (hyperlipidemia)   Time coordinating discharge: 38 minutes  Signed:        Kemarion Abbey, DO [facility]  02/15/2021, 12:37 PM

## 2021-02-15 NOTE — TOC Progression Note (Signed)
Transition of Care [facility]) - Progression Note    Patient Details  Name: Lori [name] MRN: [id number] Date of Birth: 1970/12/30  [facility] CM/SW Contact  Purcell Mouton, RN Phone Number: 02/15/2021, 1:00 PM  Clinical Narrative:    Corey Harold was called.    Expected Discharge Plan: [place] Barriers to Discharge: No Barriers Identified  Expected Discharge Plan and Services Expected Discharge Plan: [place] arrangements for the past 2 months: [place] Expected Discharge Date: 02/15/21                                     Social Determinants of Health (SDOH) Interventions    Readmission Risk Interventions No flowsheet data found.

## 2021-02-15 NOTE — Progress Notes (Addendum)
[facility] arrived to transport pt to [facility]. Pt was given metoprolol, ambien, ativan and gabapentin before leaving room 1445.  No complaint of pain/discomfort. No distress noted. Pt Alert and oriented x4. All personal belongings sent with pt.

## 2021-02-15 NOTE — Progress Notes (Signed)
Occupational Therapy Treatment Patient Details Name: Lori [name] MRN: [id number] DOB: [birth year] Today's Date: 02/15/2021   History of present illness Lori [name] is a 52 y.o. female who presents with c/o nausea and vomiting. Pt admitted 02/06/21 with acute lower UTI. PMH: type 2 diabetes, CAD s/p CABG, CVA with paraplegia, HTN, hyperlipidemia, hyperthyroidism   OT comments  Patient is expected to return to facility today for rehab.  Focus of treatment was to check skin and reiterate wear schedule with patient. Patient reports - via gestures - she tolerated 5 hrs of wrist cock up and 3 hrs of resting hand splint yesterday. She reports 3rd digit discomfort with resting hand splint. No obvious signs of skin break down except for one spot high on forearm - patient unsure if abrasion was present prior to splint. Therapist asked patient to watch spot. Patient verbalized understanding of all education and wear schedule. Prior to donning wrist splint therapist provided PROM and stretching to fingers, wrist and forearm. Patient did grimace with wrist extension stretch. Patient tolerating well.   Recommendations for follow up therapy are one component of a multi-disciplinary discharge planning process, led by the attending physician.  Recommendations may be updated based on patient status, additional functional criteria and insurance authorization.    Follow Up Recommendations  Acute inpatient rehab (3hours/day)    Assistance Recommended at Discharge Frequent or constant Supervision/Assistance  Patient can return home with the following  A lot of help with walking and/or transfers;A lot of help with bathing/dressing/bathroom;Direct supervision/assist for medications management;Help with stairs or ramp for entrance;Direct supervision/assist for financial management;Assistance with cooking/housework   Equipment Recommendations  Other (comment)    Recommendations for Other Services       Precautions / Restrictions Precautions Precautions: Fall Precaution Comments: L hemi (UE>LE) Restrictions Weight Bearing Restrictions: No       Mobility Bed Mobility                    Transfers                         Balance                                           ADL either performed or assessed with clinical judgement   ADL                                              Extremity/Trunk Assessment              Vision       Perception     Praxis      Cognition Arousal/Alertness: Awake/alert Behavior During Therapy: WFL for tasks assessed/performed Overall Cognitive Status: Within Functional Limits for tasks assessed                                            Exercises Other Exercises Other Exercises: PROM to wrist, forearm and fingers in preparation for donning wrist splint.   Shoulder Instructions       General Comments      Pertinent Vitals/ Pain  Pain Assessment: Faces Faces Pain Scale: Hurts a little bit Pain Location: middle digit L hand with extension Pain Intervention(s): Monitored during session  Home Living                                          Prior Functioning/Environment              Frequency  Min 2X/week        Progress Toward Goals  OT Goals(current goals can now be found in the care plan section)  Progress towards OT goals: Progressing toward goals  Acute Rehab OT Goals OT Goal Formulation: Patient unable to participate in goal setting (aphasia) Time For Goal Achievement: 02/28/21 Potential to Achieve Goals: Good  Plan Discharge plan remains appropriate    Co-evaluation          OT goals addressed during session:  (splint check)      AM-PAC OT "6 Clicks" Daily Activity     Outcome Measure   Help from another person eating meals?: A Little Help from another person taking care of personal grooming?: A  Little Help from another person toileting, which includes using toliet, bedpan, or urinal?: A Lot Help from another person bathing (including washing, rinsing, drying)?: A Lot Help from another person to put on and taking off regular upper body clothing?: A Lot Help from another person to put on and taking off regular lower body clothing?: A Lot 6 Click Score: 14    End of Session    OT Visit Diagnosis: Unsteadiness on feet (R26.81);Muscle weakness (generalized) (M62.81);Other symptoms and signs involving the nervous system (R29.898);Hemiplegia and hemiparesis Hemiplegia - Right/Left: Left Hemiplegia - dominant/non-dominant: Non-Dominant Hemiplegia - caused by: Cerebral infarction   Activity Tolerance Patient tolerated treatment well   Patient Left in bed;with call bell/phone within reach;with bed alarm set   Nurse Communication Mobility status        Time: 0938-1829 OT Time Calculation (min): 10 min  Charges: OT General Charges $OT Visit: 1 Visit OT Treatments $Orthotics/Prosthetics Check: 8-22 mins  Derl Barrow, OTR/L [place]  Office [phone] Pager: [place] 02/15/2021, 9:08 AM

## 2021-02-19 ENCOUNTER — Emergency Department (HOSPITAL_COMMUNITY): Payer: Medicare Other

## 2021-02-19 ENCOUNTER — Observation Stay: Payer: Self-pay

## 2021-02-19 ENCOUNTER — Encounter (HOSPITAL_COMMUNITY): Payer: Self-pay | Admitting: *Deleted

## 2021-02-19 ENCOUNTER — Inpatient Hospital Stay (HOSPITAL_COMMUNITY)
Admission: EM | Admit: 2021-02-19 | Discharge: 2021-02-26 | DRG: 637 | Disposition: A | Discharge status: Rehabilitation Facility | Payer: Medicare Other | Source: Skilled Nursing Facility | Attending: Internal Medicine | Admitting: Internal Medicine

## 2021-02-19 ENCOUNTER — Other Ambulatory Visit: Payer: Self-pay

## 2021-02-19 DIAGNOSIS — Z85841 Personal history of malignant neoplasm of brain: Secondary | ICD-10-CM

## 2021-02-19 DIAGNOSIS — Z7902 Long term (current) use of antithrombotics/antiplatelets: Secondary | ICD-10-CM

## 2021-02-19 DIAGNOSIS — E871 Hypo-osmolality and hyponatremia: Secondary | ICD-10-CM | POA: Diagnosis not present

## 2021-02-19 DIAGNOSIS — E1143 Type 2 diabetes mellitus with diabetic autonomic (poly)neuropathy: Secondary | ICD-10-CM | POA: Diagnosis present

## 2021-02-19 DIAGNOSIS — E1110 Type 2 diabetes mellitus with ketoacidosis without coma: Secondary | ICD-10-CM | POA: Diagnosis not present

## 2021-02-19 DIAGNOSIS — Z79899 Other long term (current) drug therapy: Secondary | ICD-10-CM

## 2021-02-19 DIAGNOSIS — Z91041 Radiographic dye allergy status: Secondary | ICD-10-CM

## 2021-02-19 DIAGNOSIS — Y92129 Unspecified place in nursing home as the place of occurrence of the external cause: Secondary | ICD-10-CM

## 2021-02-19 DIAGNOSIS — I69365 Other paralytic syndrome following cerebral infarction, bilateral: Secondary | ICD-10-CM

## 2021-02-19 DIAGNOSIS — Z6841 Body Mass Index (BMI) 40.0 and over, adult: Secondary | ICD-10-CM

## 2021-02-19 DIAGNOSIS — E0590 Thyrotoxicosis, unspecified without thyrotoxic crisis or storm: Secondary | ICD-10-CM | POA: Diagnosis present

## 2021-02-19 DIAGNOSIS — D72829 Elevated white blood cell count, unspecified: Secondary | ICD-10-CM | POA: Diagnosis present

## 2021-02-19 DIAGNOSIS — Z7401 Bed confinement status: Secondary | ICD-10-CM

## 2021-02-19 DIAGNOSIS — E876 Hypokalemia: Secondary | ICD-10-CM | POA: Diagnosis not present

## 2021-02-19 DIAGNOSIS — I69322 Dysarthria following cerebral infarction: Secondary | ICD-10-CM

## 2021-02-19 DIAGNOSIS — R000 Tachycardia, unspecified: Secondary | ICD-10-CM | POA: Diagnosis present

## 2021-02-19 DIAGNOSIS — I10 Essential (primary) hypertension: Secondary | ICD-10-CM | POA: Diagnosis present

## 2021-02-19 DIAGNOSIS — Z794 Long term (current) use of insulin: Secondary | ICD-10-CM

## 2021-02-19 DIAGNOSIS — Z888 Allergy status to other drugs, medicaments and biological substances status: Secondary | ICD-10-CM

## 2021-02-19 DIAGNOSIS — I160 Hypertensive urgency: Secondary | ICD-10-CM | POA: Diagnosis present

## 2021-02-19 DIAGNOSIS — I493 Ventricular premature depolarization: Secondary | ICD-10-CM | POA: Diagnosis not present

## 2021-02-19 DIAGNOSIS — I471 Supraventricular tachycardia: Secondary | ICD-10-CM | POA: Diagnosis not present

## 2021-02-19 DIAGNOSIS — Z880 Allergy status to penicillin: Secondary | ICD-10-CM

## 2021-02-19 DIAGNOSIS — I672 Cerebral atherosclerosis: Secondary | ICD-10-CM | POA: Diagnosis present

## 2021-02-19 DIAGNOSIS — W19XXXA Unspecified fall, initial encounter: Secondary | ICD-10-CM | POA: Diagnosis present

## 2021-02-19 DIAGNOSIS — Z7982 Long term (current) use of aspirin: Secondary | ICD-10-CM

## 2021-02-19 DIAGNOSIS — Z808 Family history of malignant neoplasm of other organs or systems: Secondary | ICD-10-CM

## 2021-02-19 DIAGNOSIS — D75838 Other thrombocytosis: Secondary | ICD-10-CM | POA: Diagnosis present

## 2021-02-19 DIAGNOSIS — Z87440 Personal history of urinary (tract) infections: Secondary | ICD-10-CM

## 2021-02-19 DIAGNOSIS — Z635 Disruption of family by separation and divorce: Secondary | ICD-10-CM

## 2021-02-19 DIAGNOSIS — I69320 Aphasia following cerebral infarction: Secondary | ICD-10-CM

## 2021-02-19 DIAGNOSIS — G8114 Spastic hemiplegia affecting left nondominant side: Secondary | ICD-10-CM | POA: Diagnosis present

## 2021-02-19 DIAGNOSIS — Z951 Presence of aortocoronary bypass graft: Secondary | ICD-10-CM

## 2021-02-19 DIAGNOSIS — Z20822 Contact with and (suspected) exposure to covid-19: Secondary | ICD-10-CM | POA: Diagnosis present

## 2021-02-19 DIAGNOSIS — I2510 Atherosclerotic heart disease of native coronary artery without angina pectoris: Secondary | ICD-10-CM | POA: Diagnosis present

## 2021-02-19 DIAGNOSIS — S00511A Abrasion of lip, initial encounter: Secondary | ICD-10-CM | POA: Diagnosis present

## 2021-02-19 DIAGNOSIS — K219 Gastro-esophageal reflux disease without esophagitis: Secondary | ICD-10-CM | POA: Diagnosis present

## 2021-02-19 DIAGNOSIS — D638 Anemia in other chronic diseases classified elsewhere: Secondary | ICD-10-CM | POA: Diagnosis present

## 2021-02-19 DIAGNOSIS — R532 Functional quadriplegia: Secondary | ICD-10-CM | POA: Diagnosis present

## 2021-02-19 DIAGNOSIS — E785 Hyperlipidemia, unspecified: Secondary | ICD-10-CM | POA: Diagnosis present

## 2021-02-19 DIAGNOSIS — K3184 Gastroparesis: Secondary | ICD-10-CM | POA: Diagnosis present

## 2021-02-19 DIAGNOSIS — I639 Cerebral infarction, unspecified: Secondary | ICD-10-CM | POA: Diagnosis present

## 2021-02-19 DIAGNOSIS — F419 Anxiety disorder, unspecified: Secondary | ICD-10-CM | POA: Diagnosis present

## 2021-02-19 DIAGNOSIS — Z833 Family history of diabetes mellitus: Secondary | ICD-10-CM

## 2021-02-19 DIAGNOSIS — R739 Hyperglycemia, unspecified: Secondary | ICD-10-CM

## 2021-02-19 DIAGNOSIS — F32A Depression, unspecified: Secondary | ICD-10-CM | POA: Diagnosis present

## 2021-02-19 HISTORY — DX: Type 2 diabetes mellitus with ketoacidosis without coma: E11.10

## 2021-02-19 LAB — BASIC METABOLIC PANEL
Anion gap: 9 (ref 5–15)
Anion gap: 7 (ref 5–15)
BUN: 7 mg/dL (ref 6–20)
BUN: 6 mg/dL (ref 6–20)
CO2: 30 mmol/L (ref 22–32)
CO2: 29 mmol/L (ref 22–32)
Calcium: 8.8 mg/dL — ABNORMAL LOW (ref 8.9–10.3)
Calcium: 8.4 mg/dL — ABNORMAL LOW (ref 8.9–10.3)
Chloride: 100 mmol/L (ref 98–111)
Chloride: 102 mmol/L (ref 98–111)
Creatinine, Ser: 0.7 mg/dL (ref 0.44–1.00)
Creatinine, Ser: 0.6 mg/dL (ref 0.44–1.00)
GFR, Estimated: >60 mL/min (ref >60)
GFR, Estimated: >60 mL/min (ref >60)
Glucose, Bld: 163 mg/dL — ABNORMAL HIGH (ref 70–99)
Glucose, Bld: 133 mg/dL — ABNORMAL HIGH (ref 70–99)
Potassium: 3.9 mmol/L (ref 3.5–5.1)
Potassium: 3.3 mmol/L — ABNORMAL LOW (ref 3.5–5.1)
Sodium: 139 mmol/L (ref 135–145)
Sodium: 138 mmol/L (ref 135–145)

## 2021-02-19 LAB — GLUCOSE, CAPILLARY
Glucose-Capillary: 137 mg/dL — ABNORMAL HIGH (ref 70–99)
Glucose-Capillary: 156 mg/dL — ABNORMAL HIGH (ref 70–99)
Glucose-Capillary: 157 mg/dL — ABNORMAL HIGH (ref 70–99)
Glucose-Capillary: 158 mg/dL — ABNORMAL HIGH (ref 70–99)
Glucose-Capillary: 169 mg/dL — ABNORMAL HIGH (ref 70–99)
Glucose-Capillary: 185 mg/dL — ABNORMAL HIGH (ref 70–99)
Glucose-Capillary: 91 mg/dL (ref 70–99)

## 2021-02-19 LAB — COMPREHENSIVE METABOLIC PANEL
ALT: 23 U/L (ref 0–44)
AST: 15 U/L (ref 15–41)
Albumin: 3.9 g/dL (ref 3.5–5.0)
Alkaline Phosphatase: 94 U/L (ref 38–126)
Anion gap: 16 — ABNORMAL HIGH (ref 5–15)
BUN: 8 mg/dL (ref 6–20)
CO2: 26 mmol/L (ref 22–32)
Calcium: 9.1 mg/dL (ref 8.9–10.3)
Chloride: 94 mmol/L — ABNORMAL LOW (ref 98–111)
Creatinine, Ser: 0.86 mg/dL (ref 0.44–1.00)
GFR, Estimated: >60 mL/min (ref >60)
Glucose, Bld: 383 mg/dL — ABNORMAL HIGH (ref 70–99)
Potassium: 3.7 mmol/L (ref 3.5–5.1)
Sodium: 136 mmol/L (ref 135–145)
Total Bilirubin: 0.9 mg/dL (ref 0.3–1.2)
Total Protein: 8.4 g/dL — ABNORMAL HIGH (ref 6.5–8.1)

## 2021-02-19 LAB — URINALYSIS, ROUTINE W REFLEX MICROSCOPIC
Bilirubin Urine: NEGATIVE
Glucose, UA: >=500 mg/dL — AB
Ketones, ur: 20 mg/dL — AB
Nitrite: NEGATIVE
Protein, ur: NEGATIVE mg/dL
Specific Gravity, Urine: 1.017 (ref 1.005–1.030)
WBC, UA: >50 WBC/hpf — ABNORMAL HIGH (ref 0–5)
pH: 5 (ref 5.0–8.0)

## 2021-02-19 LAB — RESP PANEL BY RT-PCR (FLU A&B, COVID) ARPGX2
Influenza A by PCR: NEGATIVE
Influenza B by PCR: NEGATIVE
SARS Coronavirus 2 by RT PCR: NEGATIVE

## 2021-02-19 LAB — CBC WITH DIFFERENTIAL/PLATELET
Abs Immature Granulocytes: 0.05 10*3/uL (ref 0.00–0.07)
Basophils Absolute: 0.1 10*3/uL (ref 0.0–0.1)
Basophils Relative: 0 %
Eosinophils Absolute: 0 10*3/uL (ref 0.0–0.5)
Eosinophils Relative: 0 %
HCT: 39.7 % (ref 36.0–46.0)
Hemoglobin: 13.1 g/dL (ref 12.0–15.0)
Immature Granulocytes: 0 %
Lymphocytes Relative: 12 %
Lymphs Abs: 1.4 10*3/uL (ref 0.7–4.0)
MCH: 27.2 pg (ref 26.0–34.0)
MCHC: 33 g/dL (ref 30.0–36.0)
MCV: 82.4 fL (ref 80.0–100.0)
Monocytes Absolute: 0.7 10*3/uL (ref 0.1–1.0)
Monocytes Relative: 6 %
Neutro Abs: 9.6 10*3/uL — ABNORMAL HIGH (ref 1.7–7.7)
Neutrophils Relative %: 82 %
Platelets: 581 10*3/uL — ABNORMAL HIGH (ref 150–400)
RBC: 4.82 MIL/uL (ref 3.87–5.11)
RDW: 16.1 % — ABNORMAL HIGH (ref 11.5–15.5)
WBC: 11.8 10*3/uL — ABNORMAL HIGH (ref 4.0–10.5)
nRBC: 0 % (ref 0.0–0.2)

## 2021-02-19 LAB — CBG MONITORING, ED
Glucose-Capillary: 318 mg/dL — ABNORMAL HIGH (ref 70–99)
Glucose-Capillary: 230 mg/dL — ABNORMAL HIGH (ref 70–99)
Glucose-Capillary: 137 mg/dL — ABNORMAL HIGH (ref 70–99)

## 2021-02-19 LAB — BLOOD GAS, VENOUS
Acid-Base Excess: 2.5 mmol/L — ABNORMAL HIGH (ref 0.0–2.0)
Bicarbonate: 26.8 mmol/L (ref 20.0–28.0)
O2 Saturation: 70.3 %
Patient temperature: 37
pCO2, Ven: 42 mmHg — ABNORMAL LOW (ref 44.0–60.0)
pH, Ven: 7.421 (ref 7.250–7.430)
pO2, Ven: 46.6 mmHg — ABNORMAL HIGH (ref 32.0–45.0)

## 2021-02-19 LAB — BETA-HYDROXYBUTYRIC ACID
Beta-Hydroxybutyric Acid: 2.72 mmol/L — ABNORMAL HIGH (ref 0.05–0.27)
Beta-Hydroxybutyric Acid: 0.04 mmol/L — ABNORMAL LOW (ref 0.05–0.27)

## 2021-02-19 LAB — MAGNESIUM: Magnesium: 2.6 mg/dL — ABNORMAL HIGH (ref 1.7–2.4)

## 2021-02-19 LAB — MRSA NEXT GEN BY PCR, NASAL: MRSA by PCR Next Gen: NOT DETECTED

## 2021-02-19 LAB — TSH: TSH: 0.383 u[IU]/mL (ref 0.350–4.500)

## 2021-02-19 LAB — LIPASE, BLOOD: Lipase: 27 U/L (ref 11–51)

## 2021-02-19 IMAGING — CT CT MAXILLOFACIAL W/O CM
3 series · 15 of 47 positions shown, 18 images · non-contrast
Comparison: CT head [DATE], MR head [DATE]

CLINICAL DATA: Facial trauma

EXAM:
CT HEAD WITHOUT CONTRAST
CT MAXILLOFACIAL WITHOUT CONTRAST
TECHNIQUE: Multidetector CT imaging of the head and maxillofacial structures
were performed using the standard protocol without intravenous
contrast. Multiplanar CT image reconstructions of the maxillofacial
structures were also generated.
RADIATION DOSE REDUCTION: This exam was performed according to the
departmental dose-optimization program which includes automated
exposure control, adjustment of the mA and/or kV according to
patient size and/or use of iterative reconstruction technique.

[Series 3: max soft · axial · 0.31mm/px · z∈[-222,-104]mm · 9 of 69 slices shown, 12 images]
[im 5/69  brain]
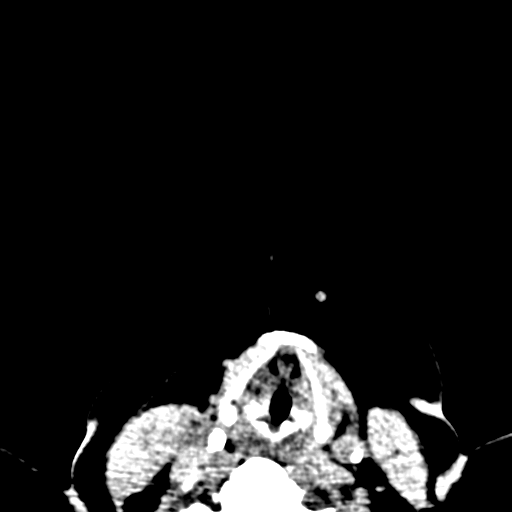
[im 5/69  bone]
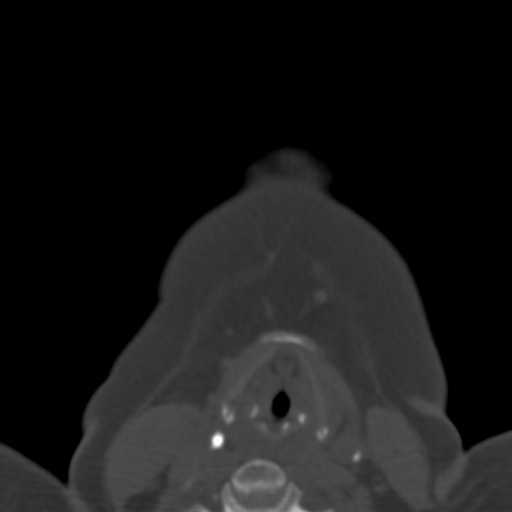
[im 12/69  bone]
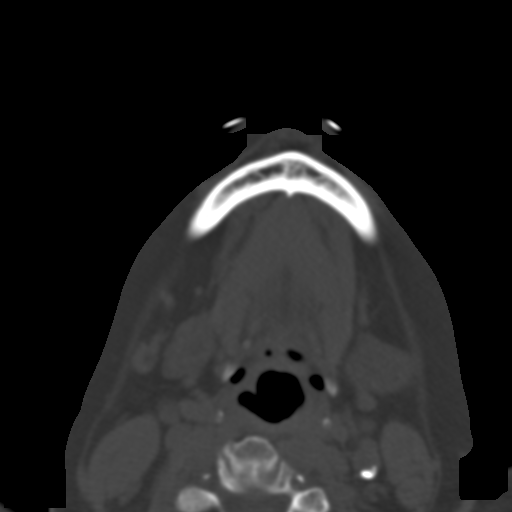
[im 19/69  bone]
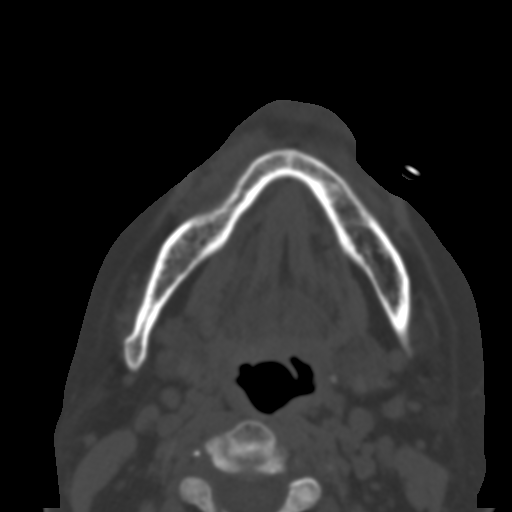
[im 26/69  bone]
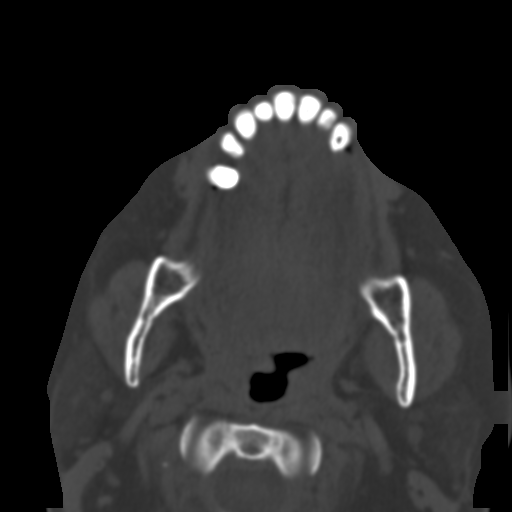
[im 36/69  brain]
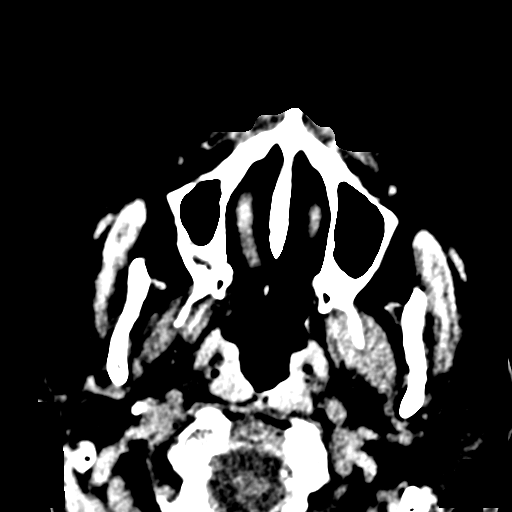
[im 36/69  bone]
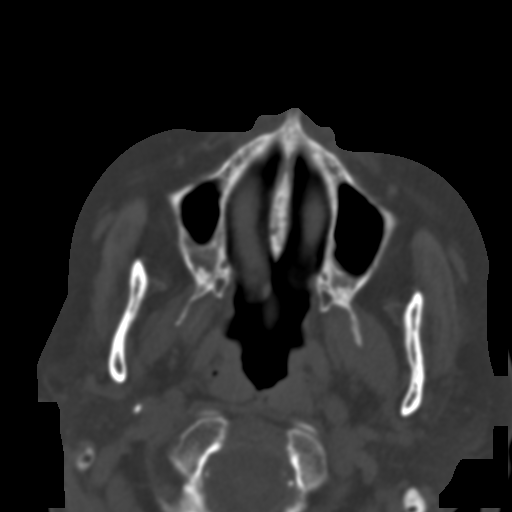
[im 43/69  bone]
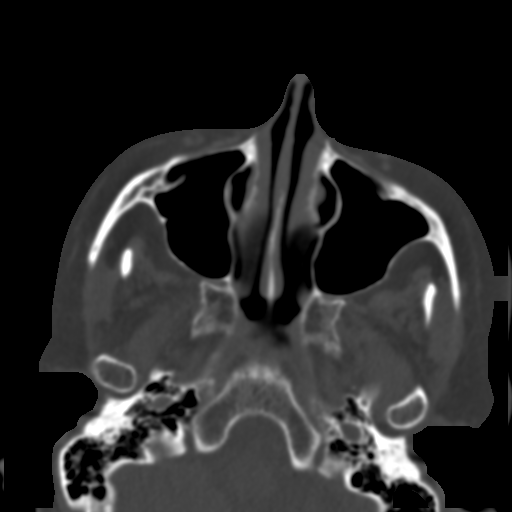
[im 50/69  bone]
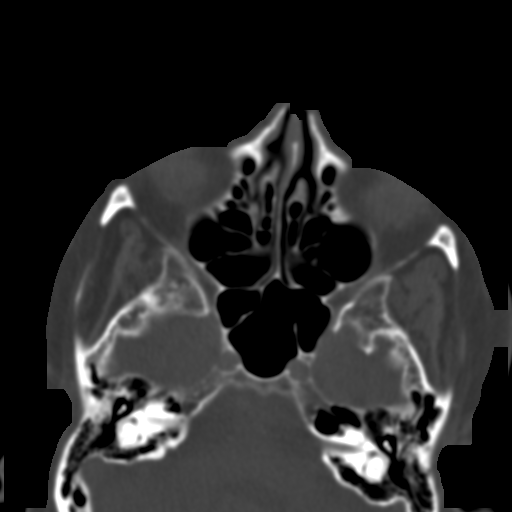
[im 57/69  bone]
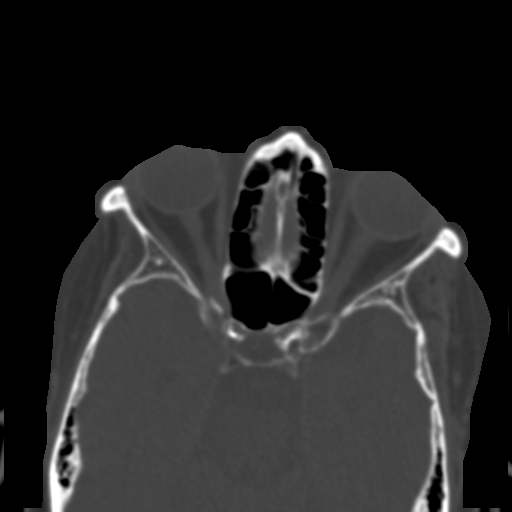
[im 64/69  brain]
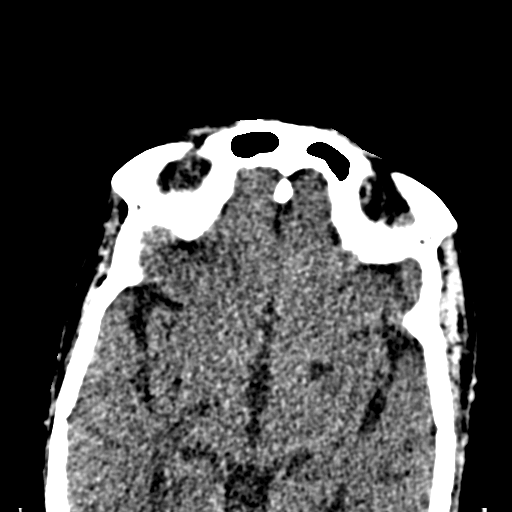
[im 64/69  bone]
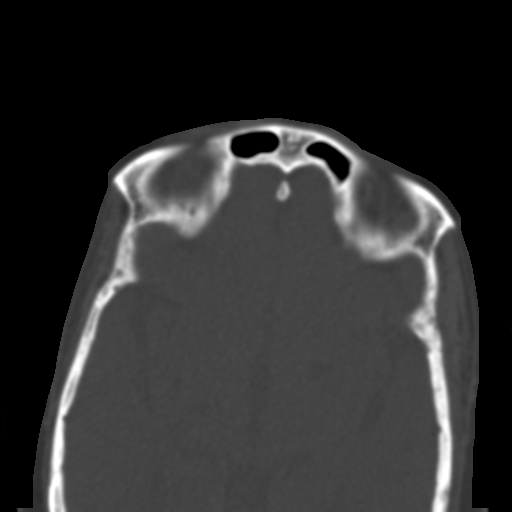

[Series 7: coronal soft · coronal · 0.30mm/px · 3 of 74 slices shown]
[im 25/74  bone]
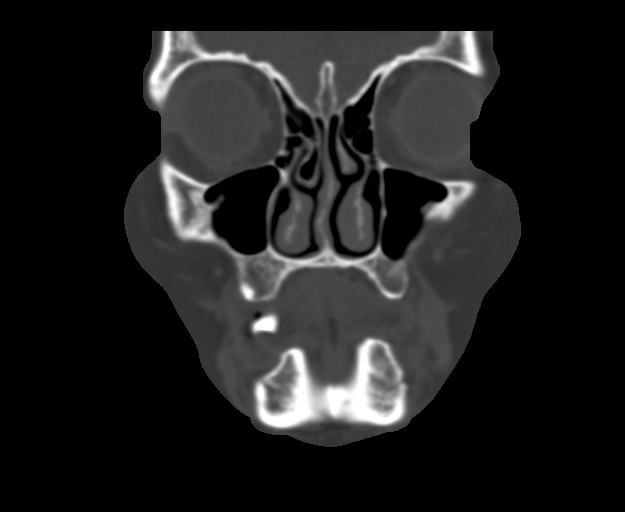
[im 33/74  bone]
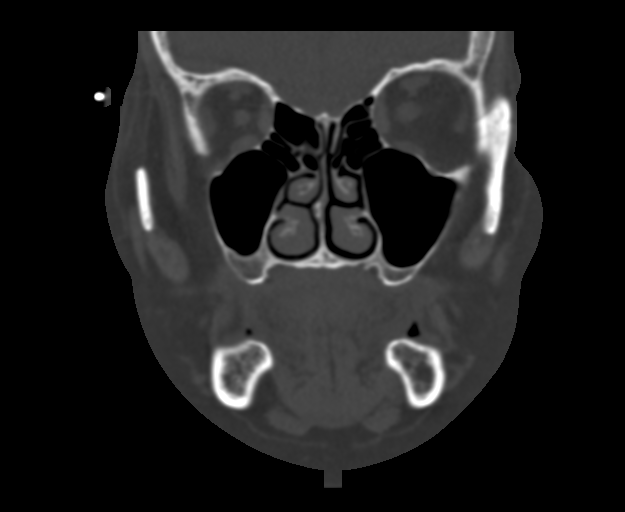
[im 41/74  bone]
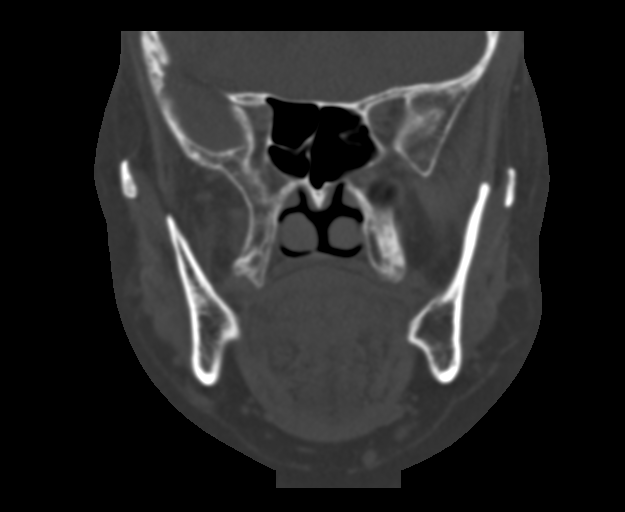

[Series 8: sagittal soft · sagittal · 0.30mm/px · 3 of 79 slices shown]
[im 27/79  bone]
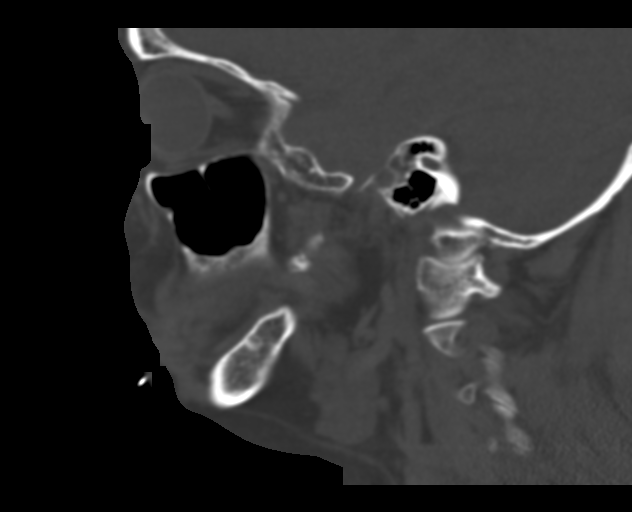
[im 40/79  bone]
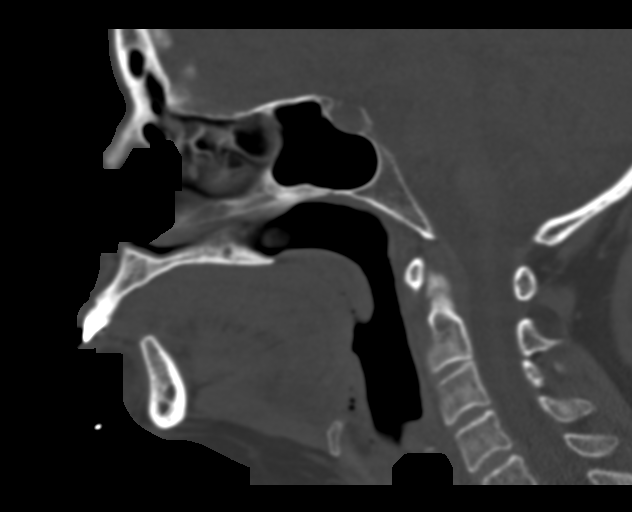
[im 53/79  bone]
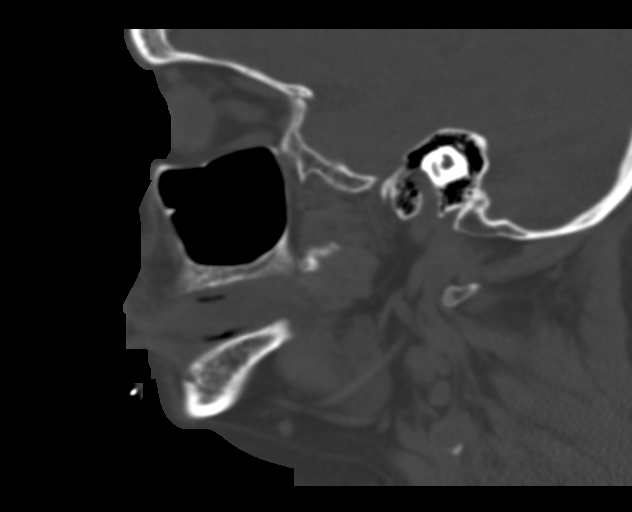

[15 of 47 positions shown; findings below may reference images not displayed]

FINDINGS: CT HEAD FINDINGS

Brain:

There is no evidence of acute intracranial hemorrhage, extra-axial
fluid collection, or acute infarct.

A remote infarct in the left cerebellar hemisphere is unchanged. A
remote cortical infarct in the left frontal lobe is unchanged.
Multiple additional remote lacunar infarcts in the bilateral basal
ganglia are also again seen. The background of parenchymal volume
loss and chronic white matter microangiopathy is not significantly
changed.

There is no mass lesion. There is no midline shift.

Vascular: There is calcification of the bilateral cavernous ICAs and
vertebral arteries.

Skull: Normal. Negative for fracture or focal lesion.

Other: None.

CT MAXILLOFACIAL FINDINGS

Osseous: No fracture or mandibular dislocation. No destructive
process.

Orbits: The globes and orbits are unremarkable.

Sinuses: The paranasal sinuses are clear. The mastoid air cells are
clear.

Soft tissues: Unremarkable.
IMPRESSION: 1. No acute intracranial pathology.
2. No acute facial bone fracture.
3. Multiple remote infarcts as above, unchanged.

## 2021-02-19 MED ORDER — DILTIAZEM HCL 25 MG/5ML IV SOLN
10.0000 mg | Freq: Once | INTRAVENOUS | Status: DC
  Filled 2021-02-19: qty 5

## 2021-02-19 MED ORDER — SODIUM CHLORIDE 0.9 % IV SOLN
25.0000 mg | Freq: Four times a day (QID) | INTRAVENOUS | Status: DC | PRN
  Administered 2021-02-19 – 2021-02-23 (×6): 25 mg via INTRAVENOUS
  Filled 2021-02-19 (×6): qty 25

## 2021-02-19 MED ORDER — DEXTROSE 50 % IV SOLN: 0.0000 mL | INTRAVENOUS | Status: DC | PRN

## 2021-02-19 MED ORDER — SODIUM CHLORIDE 0.9% FLUSH
10.0000 mL | Freq: Two times a day (BID) | INTRAVENOUS | Status: DC
  Administered 2021-02-19: 10 mL
  Administered 2021-02-20 (×2): 20 mL
  Administered 2021-02-21 – 2021-02-26 (×9): 10 mL

## 2021-02-19 MED ORDER — CHLORHEXIDINE GLUCONATE CLOTH 2 % EX PADS
6.0000 | MEDICATED_PAD | Freq: Every day | CUTANEOUS | Status: DC
  Administered 2021-02-19 – 2021-02-26 (×8): 6 via TOPICAL

## 2021-02-19 MED ORDER — MAGNESIUM SULFATE 2 GM/50ML IV SOLN
2.0000 g | Freq: Once | INTRAVENOUS | Status: AC
  Administered 2021-02-19: 2 g via INTRAVENOUS
  Filled 2021-02-19: qty 50

## 2021-02-19 MED ORDER — POTASSIUM CHLORIDE CRYS ER 20 MEQ PO TBCR
40.0000 meq | EXTENDED_RELEASE_TABLET | Freq: Once | ORAL | Status: AC
  Administered 2021-02-20: 40 meq via ORAL
  Filled 2021-02-19: qty 2

## 2021-02-19 MED ORDER — SODIUM CHLORIDE 0.9 % IV SOLN: 250.0000 mL | INTRAVENOUS | Status: DC | PRN

## 2021-02-19 MED ORDER — NYSTATIN 100000 UNIT/GM EX POWD
Freq: Three times a day (TID) | CUTANEOUS | Status: DC
  Filled 2021-02-19 (×3): qty 15

## 2021-02-19 MED ORDER — DILTIAZEM HCL 25 MG/5ML IV SOLN
10.0000 mg | Freq: Four times a day (QID) | INTRAVENOUS | Status: DC
  Administered 2021-02-19 – 2021-02-20 (×5): 10 mg via INTRAVENOUS
  Filled 2021-02-19 (×4): qty 5

## 2021-02-19 MED ORDER — PANTOPRAZOLE SODIUM 40 MG IV SOLR
40.0000 mg | Freq: Every day | INTRAVENOUS | Status: DC
  Administered 2021-02-19 – 2021-02-23 (×5): 40 mg via INTRAVENOUS
  Filled 2021-02-19 (×5): qty 40

## 2021-02-19 MED ORDER — POTASSIUM CHLORIDE 10 MEQ/100ML IV SOLN
10.0000 meq | INTRAVENOUS | Status: AC
  Administered 2021-02-19 (×2): 10 meq via INTRAVENOUS
  Filled 2021-02-19 (×2): qty 100

## 2021-02-19 MED ORDER — ORAL CARE MOUTH RINSE
15.0000 mL | Freq: Two times a day (BID) | OROMUCOSAL | Status: DC
  Administered 2021-02-19 – 2021-02-25 (×13): 15 mL via OROMUCOSAL

## 2021-02-19 MED ORDER — SODIUM CHLORIDE 0.9% FLUSH
3.0000 mL | INTRAVENOUS | Status: DC | PRN
Start: 2021-02-19 — End: 2021-02-26

## 2021-02-19 MED ORDER — LACTATED RINGERS IV BOLUS
2000.0000 mL | Freq: Once | INTRAVENOUS | Status: AC
  Administered 2021-02-19: 2000 mL via INTRAVENOUS

## 2021-02-19 MED ORDER — SODIUM CHLORIDE 0.9% FLUSH
3.0000 mL | Freq: Two times a day (BID) | INTRAVENOUS | Status: DC
  Administered 2021-02-20 – 2021-02-24 (×7): 3 mL via INTRAVENOUS

## 2021-02-19 MED ORDER — LACTATED RINGERS IV BOLUS: 20.0000 mL/kg | Freq: Once | INTRAVENOUS | Status: DC

## 2021-02-19 MED ORDER — LACTATED RINGERS IV SOLN: INTRAVENOUS | Status: DC

## 2021-02-19 MED ORDER — INSULIN REGULAR(HUMAN) IN NACL 100-0.9 UT/100ML-% IV SOLN
INTRAVENOUS | Status: DC
  Administered 2021-02-19: 18 [IU]/h via INTRAVENOUS
  Filled 2021-02-19: qty 100

## 2021-02-19 MED ORDER — METOPROLOL TARTRATE 5 MG/5ML IV SOLN
5.0000 mg | Freq: Four times a day (QID) | INTRAVENOUS | Status: DC
  Administered 2021-02-19 – 2021-02-20 (×4): 5 mg via INTRAVENOUS
  Filled 2021-02-19 (×5): qty 5

## 2021-02-19 MED ORDER — METOPROLOL TARTRATE 5 MG/5ML IV SOLN
5.0000 mg | Freq: Once | INTRAVENOUS | Status: AC
  Administered 2021-02-19: 5 mg via INTRAVENOUS
  Filled 2021-02-19: qty 5

## 2021-02-19 MED ORDER — DILTIAZEM LOAD VIA INFUSION
10.0000 mg | Freq: Once | INTRAVENOUS | Status: AC
  Administered 2021-02-19: 10 mg via INTRAVENOUS
  Filled 2021-02-19: qty 10

## 2021-02-19 MED ORDER — INSULIN ASPART 100 UNIT/ML IJ SOLN
0.0000 [IU] | Freq: Three times a day (TID) | INTRAMUSCULAR | Status: DC
  Administered 2021-02-20: 8 [IU] via SUBCUTANEOUS
  Administered 2021-02-20: 3 [IU] via SUBCUTANEOUS
  Administered 2021-02-20: 8 [IU] via SUBCUTANEOUS
  Administered 2021-02-21 – 2021-02-22 (×4): 5 [IU] via SUBCUTANEOUS
  Administered 2021-02-22 – 2021-02-23 (×2): 3 [IU] via SUBCUTANEOUS
  Administered 2021-02-23: 2 [IU] via SUBCUTANEOUS
  Administered 2021-02-23: 3 [IU] via SUBCUTANEOUS
  Administered 2021-02-24: 8 [IU] via SUBCUTANEOUS
  Administered 2021-02-24 (×2): 3 [IU] via SUBCUTANEOUS
  Administered 2021-02-25: 5 [IU] via SUBCUTANEOUS
  Administered 2021-02-25: 8 [IU] via SUBCUTANEOUS
  Administered 2021-02-25: 2 [IU] via SUBCUTANEOUS
  Administered 2021-02-26: 5 [IU] via SUBCUTANEOUS
  Administered 2021-02-26: 8 [IU] via SUBCUTANEOUS

## 2021-02-19 MED ORDER — DEXTROSE IN LACTATED RINGERS 5 % IV SOLN: INTRAVENOUS | Status: DC

## 2021-02-19 MED ORDER — DILTIAZEM HCL-DEXTROSE 125-5 MG/125ML-% IV SOLN (PREMIX)
5.0000 mg/h | INTRAVENOUS | Status: DC
  Administered 2021-02-19: 5 mg/h via INTRAVENOUS
  Filled 2021-02-19: qty 125

## 2021-02-19 MED ORDER — SODIUM CHLORIDE 0.9 % IV SOLN
8.0000 mg | Freq: Once | INTRAVENOUS | Status: AC
  Administered 2021-02-19: 8 mg via INTRAVENOUS
  Filled 2021-02-19: qty 8

## 2021-02-19 MED ORDER — ENOXAPARIN SODIUM 40 MG/0.4ML IJ SOSY: 40.0000 mg | PREFILLED_SYRINGE | INTRAMUSCULAR | Status: DC

## 2021-02-19 MED ORDER — INSULIN ASPART 100 UNIT/ML IJ SOLN
0.0000 [IU] | Freq: Every day | INTRAMUSCULAR | Status: DC
  Administered 2021-02-22: 2 [IU] via SUBCUTANEOUS

## 2021-02-19 MED ORDER — SODIUM CHLORIDE 0.9% FLUSH
10.0000 mL | INTRAVENOUS | Status: DC | PRN
  Administered 2021-02-23: 10 mL

## 2021-02-19 MED ORDER — INSULIN GLARGINE-YFGN 100 UNIT/ML ~~LOC~~ SOLN
5.0000 [IU] | Freq: Once | SUBCUTANEOUS | Status: AC
  Administered 2021-02-19: 5 [IU] via SUBCUTANEOUS
  Filled 2021-02-19: qty 0.05

## 2021-02-19 NOTE — ED Provider Notes (Addendum)
[facility] DEPT Provider Note  CSN: [id number] Arrival date & time: 02/19/21 1023  Chief Complaint(s) Emesis and Hyperglycemia  HPI Lori [name] is a 51 y.o. female with PMH CVA, HTN, T2DM, hypothyroidism, anxiety depression, pheochromocytoma (?) who presents emergency department for evaluation of hyperglycemia and vomiting.  She had a recent admission for intractable nausea and vomiting on 02/06/2021 with a prolonged hospital stay and ultimate discharge on 02/15/2021 to a SNF.  Patient states that while at her SNF they have not given her any one of her medications over the last 4 days and thus she arrives hyperglycemic with persistent nausea and vomiting.  Patient arrives tachycardic into the 150s with persistent vomiting in the room and hypertensive.  Denies chest pain, shortness of breath, headache, fever or other systemic symptoms.  Glucose 536 per EMS.   Emesis Hyperglycemia Associated symptoms: nausea and vomiting    Past Medical History Past Medical History:  Diagnosis Date   CAD (coronary artery disease)    Depression    Diabetes mellitus without complication ([place])    Hypertension    Pheochromocytoma    Pheochromocytoma    Stroke [facility])    Thyroid disease    Patient Active Problem List   Diagnosis Date Noted   DKA (diabetic ketoacidosis) ([place]) 02/19/2021   Constipation    Weakness    Type 2 diabetes mellitus with hyperlipidemia ([place])    Aphasia    Spell of abnormal behavior    Stroke ([place]) 01/19/2021   Tachycardia 01/19/2021   CAD (coronary artery disease) 01/19/2021   CVA (cerebral vascular accident) ([place]) 01/19/2021   Diabetes mellitus without complication ([place])    Hyperthyroidism    Essential hypertension    Pheochromocytoma    Depression    HLD (hyperlipidemia)    Home Medication(s) Prior to Admission medications   Medication Sig Start Date End Date Taking? Authorizing Provider  amLODipine (NORVASC) 5 MG tablet Take 1  tablet (5 mg total) by mouth daily. 02/02/21   Richarda Osmond, MD  aspirin 81 MG chewable tablet Chew 1 tablet (81 mg total) by mouth daily. 02/02/21   Richarda Osmond, MD  atorvastatin (LIPITOR) 80 MG tablet Take 80 mg by mouth daily.    [provider]  calcium carbonate (TUMS - DOSED IN MG ELEMENTAL CALCIUM) 500 MG chewable tablet Chew 1 tablet (200 mg of elemental calcium total) by mouth daily. 02/11/21   Samuella Cota, MD  clopidogrel (PLAVIX) 75 MG tablet Take 75 mg by mouth daily.    [provider]  cyclobenzaprine (FLEXERIL) 5 MG tablet Take 1 tablet (5 mg total) by mouth 3 (three) times daily as needed for muscle spasms. 02/15/21   Swayze, Ava, DO  escitalopram (LEXAPRO) 20 MG tablet Take 20 mg by mouth daily.    [provider]  gabapentin (NEURONTIN) 100 MG capsule Take 100 mg by mouth daily.    [provider]  gabapentin (NEURONTIN) 100 MG capsule Take 1 capsule (100 mg total) by mouth 3 (three) times daily. 02/15/21   Swayze, Ava, DO  insulin glargine (LANTUS) 100 UNIT/ML injection Inject 0.25 mLs (25 Units total) into the skin daily. 02/11/21   Samuella Cota, MD  lisinopril (ZESTRIL) 20 MG tablet Take 1 tablet (20 mg total) by mouth daily. 02/02/21   Richarda Osmond, MD  loratadine (CLARITIN) 10 MG tablet Take 1 tablet (10 mg total) by mouth daily. 02/02/21   Richarda Osmond, MD  LORazepam [place]e [place])  0.5 MG tablet Take 0.5 tablets (0.25 mg total) by mouth at bedtime. 02/11/21   Samuella Cota, MD  melatonin 3 MG TABS tablet Take 9 mg by mouth at bedtime.    [provider]  methimazole (TAPAZOLE) 5 MG tablet Take 1 tablet (5 mg total) by mouth daily. 02/02/21   Richarda Osmond, MD  metoCLOPramide (REGLAN) 10 MG tablet Take 1 tablet (10 mg total) by mouth 3 (three) times daily before meals. 02/01/21   Richarda Osmond, MD  metoprolol tartrate (LOPRESSOR) 100 MG tablet Take 1 tablet (100 mg total) by mouth 2 (two)  times daily. 02/01/21   Richarda Osmond, MD  ondansetron (ZOFRAN) 4 MG tablet Take 4 mg by mouth once.    [provider]  pantoprazole (PROTONIX) 40 MG tablet Take 1 tablet (40 mg total) by mouth daily. 02/12/21   Samuella Cota, MD  polyethylene glycol (MIRALAX / GLYCOLAX) 17 g packet Take 17 g by mouth 2 (two) times daily. 02/15/21   Swayze, Ava, DO  senna (SENOKOT) 8.6 MG TABS tablet Take 1 tablet (8.6 mg total) by mouth at bedtime. 02/15/21   Swayze, Ava, DO  zolpidem (AMBIEN) 5 MG tablet Take 1 tablet (5 mg total) by mouth at bedtime as needed for sleep. Patient taking differently: Take 5 mg by mouth at bedtime. 02/01/21   Richarda Osmond, MD                                                                                                                                    Past Surgical History Past Surgical History:  Procedure Laterality Date   CORONARY ARTERY BYPASS GRAFT     Right adrenal gland removal for pheochromocytoma Right    Family History Family History  Problem Relation Age of Onset   Diabetes Mother    Diabetes Brother    Brain cancer Brother     Social History Social History   Tobacco Use   Smoking status: Never   Smokeless tobacco: Never  Substance Use Topics   Alcohol use: Not Currently   Drug use: Never   Allergies Contrast media [iodinated contrast media], Penicillins, and Sumatriptan  Review of Systems Review of Systems  Gastrointestinal:  Positive for nausea and vomiting.   Physical Exam Vital Signs  I have reviewed the triage vital signs BP (!) 156/133    Pulse (!) 117    Temp 100 F (37.8 C) (Rectal)    Resp (!) 24    Ht 5\' 2"  (1.575 m)    Wt 89.8 kg    SpO2 96%    BMI 36.21 kg/m   Physical Exam Vitals and nursing note reviewed.  Constitutional:      General: She is not in acute distress.    Appearance: She is well-developed.  HENT:     Head: Normocephalic and atraumatic.  Eyes:     Conjunctiva/sclera: Conjunctivae  normal.  Cardiovascular:     Rate and Rhythm: Regular rhythm. Tachycardia present.     Heart sounds: No murmur heard. Pulmonary:     Effort: Pulmonary effort is normal. No respiratory distress.     Breath sounds: Normal breath sounds.  Abdominal:     Palpations: Abdomen is soft.     Tenderness: There is no abdominal tenderness.  Musculoskeletal:        General: No swelling.     Cervical back: Neck supple.  Skin:    General: Skin is warm and dry.     Capillary Refill: Capillary refill takes less than 2 seconds.  Neurological:     Mental Status: She is alert.  Psychiatric:        Mood and Affect: Mood normal.    ED Results and Treatments Labs (all labs ordered are listed, but only abnormal results are displayed) Labs Reviewed  CBC WITH DIFFERENTIAL/PLATELET - Abnormal; Notable for the following components:      Result Value   WBC 11.8 (*)    RDW 16.1 (*)    Platelets 581 (*)    Neutro Abs 9.6 (*)    All other components within normal limits  COMPREHENSIVE METABOLIC PANEL - Abnormal; Notable for the following components:   Chloride 94 (*)    Glucose, Bld 383 (*)    Total Protein 8.4 (*)    Anion gap 16 (*)    All other components within normal limits  BLOOD GAS, VENOUS - Abnormal; Notable for the following components:   pCO2, Ven 42.0 (*)    pO2, Ven 46.6 (*)    Acid-Base Excess 2.5 (*)    All other components within normal limits  BETA-HYDROXYBUTYRIC ACID - Abnormal; Notable for the following components:   Beta-Hydroxybutyric Acid 2.72 (*)    All other components within normal limits  CBG MONITORING, ED - Abnormal; Notable for the following components:   Glucose-Capillary 318 (*)    All other components within normal limits  LIPASE, BLOOD  URINALYSIS, ROUTINE W REFLEX MICROSCOPIC  TSH  BASIC METABOLIC PANEL  BASIC METABOLIC PANEL  BASIC METABOLIC PANEL  BETA-HYDROXYBUTYRIC ACID  HEMOGLOBIN A1C  MAGNESIUM                                                                                                                           Radiology CT Head Wo Contrast  Result Date: 02/19/2021 CLINICAL DATA:  Facial trauma EXAM: CT HEAD WITHOUT CONTRAST CT MAXILLOFACIAL WITHOUT CONTRAST TECHNIQUE: Multidetector CT imaging of the head and maxillofacial structures were performed using the standard protocol without intravenous contrast. Multiplanar CT image reconstructions of the maxillofacial structures were also generated. RADIATION DOSE REDUCTION: This exam was performed according to the departmental dose-optimization program which includes automated exposure control, adjustment of the mA and/or kV according to patient size and/or use of iterative reconstruction technique. COMPARISON:  CT head 01/24/2021, MR head 01/20/2021 FINDINGS: CT HEAD FINDINGS Brain: There is no evidence of  acute intracranial hemorrhage, extra-axial fluid collection, or acute infarct. A remote infarct in the left cerebellar hemisphere is unchanged. A remote cortical infarct in the left frontal lobe is unchanged. Multiple additional remote lacunar infarcts in the bilateral basal ganglia are also again seen. The background of parenchymal volume loss and chronic white matter microangiopathy is not significantly changed. There is no mass lesion. There is no midline shift. Vascular: There is calcification of the bilateral cavernous ICAs and vertebral arteries. Skull: Normal. Negative for fracture or focal lesion. Other: None. CT MAXILLOFACIAL FINDINGS Osseous: No fracture or mandibular dislocation. No destructive process. Orbits: The globes and orbits are unremarkable. Sinuses: The paranasal sinuses are clear. The mastoid air cells are clear. Soft tissues: Unremarkable. IMPRESSION: 1. No acute intracranial pathology. 2. No acute facial bone fracture. 3. Multiple remote infarcts as above, unchanged. Electronically Signed   By: Valetta Mole M.D.   On: 02/19/2021 13:25   CT Maxillofacial Wo Contrast  Result Date:  02/19/2021 CLINICAL DATA:  Facial trauma EXAM: CT HEAD WITHOUT CONTRAST CT MAXILLOFACIAL WITHOUT CONTRAST TECHNIQUE: Multidetector CT imaging of the head and maxillofacial structures were performed using the standard protocol without intravenous contrast. Multiplanar CT image reconstructions of the maxillofacial structures were also generated. RADIATION DOSE REDUCTION: This exam was performed according to the departmental dose-optimization program which includes automated exposure control, adjustment of the mA and/or kV according to patient size and/or use of iterative reconstruction technique. COMPARISON:  CT head 01/24/2021, MR head 01/20/2021 FINDINGS: CT HEAD FINDINGS Brain: There is no evidence of acute intracranial hemorrhage, extra-axial fluid collection, or acute infarct. A remote infarct in the left cerebellar hemisphere is unchanged. A remote cortical infarct in the left frontal lobe is unchanged. Multiple additional remote lacunar infarcts in the bilateral basal ganglia are also again seen. The background of parenchymal volume loss and chronic white matter microangiopathy is not significantly changed. There is no mass lesion. There is no midline shift. Vascular: There is calcification of the bilateral cavernous ICAs and vertebral arteries. Skull: Normal. Negative for fracture or focal lesion. Other: None. CT MAXILLOFACIAL FINDINGS Osseous: No fracture or mandibular dislocation. No destructive process. Orbits: The globes and orbits are unremarkable. Sinuses: The paranasal sinuses are clear. The mastoid air cells are clear. Soft tissues: Unremarkable. IMPRESSION: 1. No acute intracranial pathology. 2. No acute facial bone fracture. 3. Multiple remote infarcts as above, unchanged. Electronically Signed   By: Valetta Mole M.D.   On: 02/19/2021 13:25    Pertinent labs & imaging results that were available during my care of the patient were reviewed by me and considered in my medical decision making (see MDM  for details).  Medications Ordered in ED Medications  diltiazem (CARDIZEM) 1 mg/mL load via infusion 10 mg (10 mg Intravenous Bolus from Bag 02/19/21 1317)    And  diltiazem (CARDIZEM) 125 mg in dextrose 5% 125 mL (1 mg/mL) infusion (5 mg/hr Intravenous New Bag/Given 02/19/21 1314)  lactated ringers bolus 1,796 mL (has no administration in time range)  insulin regular, human (MYXREDLIN) 100 units/ 100 mL infusion (has no administration in time range)  lactated ringers infusion (has no administration in time range)  dextrose 5 % in lactated ringers infusion (has no administration in time range)  dextrose 50 % solution 0-50 mL (has no administration in time range)  potassium chloride 10 mEq in 100 mL IVPB (has no administration in time range)  magnesium sulfate IVPB 2 g 50 mL (has no administration in time range)  lactated  ringers bolus 2,000 mL (0 mLs Intravenous Stopped 02/19/21 1404)  ondansetron (ZOFRAN) 8 mg in sodium chloride 0.9 % 50 mL IVPB (0 mg Intravenous Stopped 02/19/21 1142)  metoprolol tartrate (LOPRESSOR) injection 5 mg (5 mg Intravenous Given 02/19/21 1417)                                                                                                                                     Procedures .Critical Care Performed by: Teressa Lower, MD Authorized by: Teressa Lower, MD   Critical care provider statement:    Critical care time (minutes):  30   Critical care was necessary to treat or prevent imminent or life-threatening deterioration of the following conditions:  Cardiac failure   Critical care was time spent personally by me on the following activities:  Development of treatment plan with patient or surrogate, discussions with consultants, evaluation of patient's response to treatment, examination of patient, ordering and review of laboratory studies, ordering and review of radiographic studies, ordering and performing treatments and interventions, pulse oximetry,  re-evaluation of patient's condition and review of old charts  (including critical care time)  Medical Decision Making / ED Course   This patient presents to the ED for concern of nausea and vomiting, hyperglycemia, this involves an extensive number of treatment options, and is a complaint that carries with it a high risk of complications and morbidity.  The differential diagnosis includes DKA, stress hyperglycemia, HHS, pheochromocytoma, a flutter, thyroid storm  MDM: Patient seen emergency department for evaluation of hyperglycemia, nausea and vomiting.  Physical exam reveals a regular tachycardia, known left-sided deficits from her previous CVA but is otherwise unremarkable.  Physical exam also with a small abrasion to the lip as the patient states she fell on her face at the SNF.  Laboratory evaluation with hyperglycemia with glucose at 383 but normal bicarb, blood gas with normal pH, beta hydroxybutyrate elevated to 2.72, leukocytosis to 11.8 likely elevated in the setting of stress demargination from vomiting.  CT head and max face unremarkable and negative for traumatic pathology.  In the setting of rapid tachycardia and hyperglycemia, patient fluid resuscitated with 2 L lactated Ringer's with no change in the patient's heart rate.  ECG appears to be a flutter with 2-1 which is new for this patient.  Patient is not anticoagulated and is not a candidate for cardioversion at this time.  She is maintaining appropriate blood pressure and I consulted cardiology to ensure that the patient is safe for diltiazem in the setting of possible thyroid storm.  TSH is currently pending. Cardiology ok with dilt and pt started on dilt. Pt then admitted to hospitalist for symptom control and persistent tachycardia.     Additional history obtained:  -External records from outside source obtained and reviewed including: Chart review including previous notes, labs, imaging, consultation notes   Lab Tests: -I  ordered, reviewed, and interpreted labs.   The  pertinent results include:   Labs Reviewed  CBC WITH DIFFERENTIAL/PLATELET - Abnormal; Notable for the following components:      Result Value   WBC 11.8 (*)    RDW 16.1 (*)    Platelets 581 (*)    Neutro Abs 9.6 (*)    All other components within normal limits  COMPREHENSIVE METABOLIC PANEL - Abnormal; Notable for the following components:   Chloride 94 (*)    Glucose, Bld 383 (*)    Total Protein 8.4 (*)    Anion gap 16 (*)    All other components within normal limits  BLOOD GAS, VENOUS - Abnormal; Notable for the following components:   pCO2, Ven 42.0 (*)    pO2, Ven 46.6 (*)    Acid-Base Excess 2.5 (*)    All other components within normal limits  BETA-HYDROXYBUTYRIC ACID - Abnormal; Notable for the following components:   Beta-Hydroxybutyric Acid 2.72 (*)    All other components within normal limits  CBG MONITORING, ED - Abnormal; Notable for the following components:   Glucose-Capillary 318 (*)    All other components within normal limits  LIPASE, BLOOD  URINALYSIS, ROUTINE W REFLEX MICROSCOPIC  TSH  BASIC METABOLIC PANEL  BASIC METABOLIC PANEL  BASIC METABOLIC PANEL  BETA-HYDROXYBUTYRIC ACID  HEMOGLOBIN A1C  MAGNESIUM      EKG   EKG Interpretation  Date/Time:  Monday February 19 2021 10:39:22 EST Ventricular Rate:  153 PR Interval:  104 QRS Duration: 78 QT Interval:  294 QTC Calculation: 469 R Axis:   76 Text Interpretation: atrial flutter 2-1 Confirmed by [place] ([id number]) on 02/19/2021 2:25:25 PM         Imaging Studies ordered: I ordered imaging studies including CT head max face I independently visualized and interpreted imaging. I agree with the radiologist interpretation   Medicines ordered and prescription drug management: Meds ordered this encounter  Medications   lactated ringers bolus 2,000 mL   ondansetron (ZOFRAN) 8 mg in sodium chloride 0.9 % 50 mL IVPB   DISCONTD: diltiazem  (CARDIZEM) injection 10 mg   AND Linked Order Group    diltiazem (CARDIZEM) 1 mg/mL load via infusion 10 mg    diltiazem (CARDIZEM) 125 mg in dextrose 5% 125 mL (1 mg/mL) infusion   lactated ringers bolus 1,796 mL   insulin regular, human (MYXREDLIN) 100 units/ 100 mL infusion    Order Specific Question:   EndoTool low target:    Answer:   140    Order Specific Question:   EndoTool high target:    Answer:   180    Order Specific Question:   Type of Diabetes    Answer:   Type 2    Order Specific Question:   Mode of Therapy    Answer:   ENDOX1 for DKA    Order Specific Question:   Start Method    Answer:   EndoTool to calculate   lactated ringers infusion   dextrose 5 % in lactated ringers infusion   dextrose 50 % solution 0-50 mL   potassium chloride 10 mEq in 100 mL IVPB   magnesium sulfate IVPB 2 g 50 mL   metoprolol tartrate (LOPRESSOR) injection 5 mg    -I have reviewed the patients home medicines and have made adjustments as needed  Critical interventions Chemical cardioversion, fluid resuscitation for endocrine crisis  Consultations Obtained: I requested consultation with the cardiologist,  and discussed lab and imaging findings as well as pertinent plan - they  recommend: Diltiazem administration   Cardiac Monitoring: The patient was maintained on a cardiac monitor.  I personally viewed and interpreted the cardiac monitored which showed an underlying rhythm of: A flutter with 2-1  Reevaluation: After the interventions noted above, I reevaluated the patient and found that they have :stayed the same  Co morbidities that complicate the patient evaluation  Past Medical History:  Diagnosis Date   CAD (coronary artery disease)    Depression    Diabetes mellitus without complication ([place])    Hypertension    Pheochromocytoma    Pheochromocytoma    Stroke [facility])    Thyroid disease       Dispostion: admit     Final Clinical Impression(s) / ED Diagnoses Final  diagnoses:  None     @PCDICTATION @    Teressa Lower, MD 02/19/21 [place], [place], MD 02/19/21 1426

## 2021-02-19 NOTE — Progress Notes (Signed)
Attempted to call daughter for PICC consent at both home and cell numbers but no answer.

## 2021-02-19 NOTE — ED Triage Notes (Signed)
BIB EMS from [place], Discharged from hospital on Friday, pt is diabetic, she has not been given any of her prescribed medications, Emesis multiple times since yesterday. CBG 543 pt has history of strokes and is contracted on left side.

## 2021-02-19 NOTE — H&P (Signed)
History and Physical    Lori [name] MCN:[id number] DOB: 10/10/1970 DOA: 02/19/2021  PCP: Pcp, No   Patient coming from: [facility].  I have personally briefly reviewed patient's old medical records in [place]  Chief Complaint: Vomiting.  HPI: Lori [name] is a 51 y.o. 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  She was discharged Friday and stated that she did not receive her insulin for her prescribed medications.  She has had multiple episodes of emesis since yesterday.  She denied headache, sore throat, rhinorrhea, dyspnea, chest pain, abdominal pain, diarrhea, melena hematochezia.  No flank pain, dysuria, frequency or hematuria.  She also stated that she fell face down and has a small abrasion on her lip.  ED Course: Initial vital signs were temperature 99.2 F, pulse 152, respiration 16, BP 162/102 mmHg O2 sat 97% on room air.  The patient was started on an insulin infusion, received 10 mg of diltiazem 10 IVP followed by continuous infusion and 2000 mL of bolus.  Lab work: Her CBC is her white count 11.8 with 82% neutrophils, hemoglobin 13.1 g/dL platelets 581.  BHA was 2.72 mmol/L.  CMP showed a chloride of 94 mmol/L, with an anion gap of 16, glucose 383 mg/dL total protein 8.4 g/dL.  Lipase was normal.  Imaging: CT head without contrast no acute intracranial pathology.  Multiple remote lacunar infarcts.  CT maxillofacial no acute facial bone fractures.  Please see images and full radiology report for further details.  Review of Systems: As per HPI otherwise all other systems reviewed and are negative.  Past Medical History:  Diagnosis Date   CAD (coronary artery disease)    Depression    Diabetes mellitus without complication  ([place])    Hypertension    Pheochromocytoma    Pheochromocytoma    Stroke [facility])    Thyroid disease     Past Surgical History:  Procedure Laterality Date   CORONARY ARTERY BYPASS GRAFT     Right adrenal gland removal for pheochromocytoma Right     Social History  reports that she has never smoked. She has never used smokeless tobacco. She reports that she does not currently use alcohol. She reports that she does not use drugs.  Allergies  Allergen Reactions   Contrast Media [Iodinated Contrast Media] Swelling   Penicillins Swelling    Mouth swells up and eyes swollen shut   Sumatriptan     Family History  Problem Relation Age of Onset   Diabetes Mother    Diabetes Brother    Brain cancer Brother    Prior to Admission medications   Medication Sig Start Date End Date Taking? Authorizing Provider  amLODipine (NORVASC) 5 MG tablet Take 1 tablet (5 mg total) by mouth daily. 02/02/21   Richarda Osmond, MD  aspirin 81 MG chewable tablet Chew 1 tablet (81 mg total) by mouth daily. 02/02/21   Richarda Osmond, MD  atorvastatin (LIPITOR) 80 MG tablet Take 80 mg by mouth daily.    [provider]  calcium carbonate (TUMS - DOSED IN MG ELEMENTAL CALCIUM) 500 MG chewable tablet Chew 1 tablet (200 mg of elemental calcium total) by mouth daily. 02/11/21   Samuella Cota, MD  clopidogrel (PLAVIX) 75 MG tablet Take 75 mg by mouth daily.  [provider]  cyclobenzaprine (FLEXERIL) 5 MG tablet Take 1 tablet (5 mg total) by mouth 3 (three) times daily as needed for muscle spasms. 02/15/21   Swayze, Ava, DO  escitalopram (LEXAPRO) 20 MG tablet Take 20 mg by mouth daily.    [provider]  gabapentin (NEURONTIN) 100 MG capsule Take 100 mg by mouth daily.    [provider]  gabapentin (NEURONTIN) 100 MG capsule Take 1 capsule (100 mg total) by mouth 3 (three) times daily. 02/15/21   Swayze, Ava, DO  insulin glargine (LANTUS) 100 UNIT/ML injection Inject  0.25 mLs (25 Units total) into the skin daily. 02/11/21   Samuella Cota, MD  lisinopril (ZESTRIL) 20 MG tablet Take 1 tablet (20 mg total) by mouth daily. 02/02/21   Richarda Osmond, MD  loratadine (CLARITIN) 10 MG tablet Take 1 tablet (10 mg total) by mouth daily. 02/02/21   Richarda Osmond, MD  LORazepam (ATIVAN) 0.5 MG tablet Take 0.5 tablets (0.25 mg total) by mouth at bedtime. 02/11/21   Samuella Cota, MD  melatonin 3 MG TABS tablet Take 9 mg by mouth at bedtime.    [provider]  methimazole (TAPAZOLE) 5 MG tablet Take 1 tablet (5 mg total) by mouth daily. 02/02/21   Richarda Osmond, MD  metoCLOPramide (REGLAN) 10 MG tablet Take 1 tablet (10 mg total) by mouth 3 (three) times daily before meals. 02/01/21   Richarda Osmond, MD  metoprolol tartrate (LOPRESSOR) 100 MG tablet Take 1 tablet (100 mg total) by mouth 2 (two) times daily. 02/01/21   Richarda Osmond, MD  ondansetron (ZOFRAN) 4 MG tablet Take 4 mg by mouth once.    [provider]  pantoprazole (PROTONIX) 40 MG tablet Take 1 tablet (40 mg total) by mouth daily. 02/12/21   Samuella Cota, MD  polyethylene glycol (MIRALAX / GLYCOLAX) 17 g packet Take 17 g by mouth 2 (two) times daily. 02/15/21   Swayze, Ava, DO  senna (SENOKOT) 8.6 MG TABS tablet Take 1 tablet (8.6 mg total) by mouth at bedtime. 02/15/21   Swayze, Ava, DO  zolpidem (AMBIEN) 5 MG tablet Take 1 tablet (5 mg total) by mouth at bedtime as needed for sleep. Patient taking differently: Take 5 mg by mouth at bedtime. 02/01/21   Richarda Osmond, MD    Physical Exam: Vitals:   02/19/21 1515 02/19/21 1516 02/19/21 1517 02/19/21 1518  BP: (!) 188/61     Pulse: (!) 126 (!) 128 (!) 119 (!) 114  Resp: 19 17 14  (!) 23  Temp:      TempSrc:      SpO2: 99% 96% 96% 99%  Weight:      Height:        Constitutional: NAD, calm, comfortable. Eyes: PERRL, lids and conjunctivae normal ENMT: Mucous membranes are moist and lips are dry.   Small labial laceration.  Posterior pharynx clear of any exudate or lesions.Normal dentition.  Neck: normal, supple, no masses, no thyromegaly Respiratory: Decreased breath sounds in bases, otherwise clear to auscultation bilaterally, no wheezing, no crackles. Normal respiratory effort. No accessory muscle use.  Cardiovascular: Sinus tachycardia in the 120s, no murmurs / rubs / gallops. No extremity edema. 2+ pedal pulses. No carotid bruits.  Abdomen: Obese, no distention.  Soft, no tenderness, no masses palpated. No hepatosplenomegaly. Bowel sounds positive.  Musculoskeletal: no clubbing / cyanosis. No joint deformity upper and lower extremities. Good ROM, no contractures. Normal muscle tone.  Skin:  no rashes, lesions, ulcers. No induration Neurologic: CN 2-12 grossly intact.  Positive dysarthria, contracture upper extremities with bilateral lower extremity weakness. Psychiatric: Normal judgment and insight. Alert and oriented x 3. Normal mood.   Labs on Admission: I have personally reviewed following labs and imaging studies  CBC: Recent Labs  Lab 02/14/21 1114 02/19/21 1044  WBC 11.8* 11.8*  NEUTROABS 8.7* 9.6*  HGB 12.8 13.1  HCT 37.5 39.7  MCV 80.5 82.4  PLT 420* 581*    Basic Metabolic Panel: Recent Labs  Lab 02/14/21 1114 02/19/21 1044  NA 135 136  K 3.8 3.7  CL 100 94*  CO2 23 26  GLUCOSE 238* 383*  BUN 8 8  CREATININE 0.67 0.86  CALCIUM 8.9 9.1    GFR: Estimated Creatinine Clearance: 83.4 mL/min (by C-G formula based on SCr of 0.86 mg/dL).  Liver Function Tests: Recent Labs  Lab 02/14/21 1114 02/19/21 1044  AST 13* 15  ALT 16 23  ALKPHOS 94 94  BILITOT 0.9 0.9  PROT 7.8 8.4*  ALBUMIN 3.3* 3.9   Radiological Exams on Admission: CT Head Wo Contrast  Result Date: 02/19/2021 CLINICAL DATA:  Facial trauma EXAM: CT HEAD WITHOUT CONTRAST CT MAXILLOFACIAL WITHOUT CONTRAST TECHNIQUE: Multidetector CT imaging of the head and maxillofacial structures were  performed using the standard protocol without intravenous contrast. Multiplanar CT image reconstructions of the maxillofacial structures were also generated. RADIATION DOSE REDUCTION: This exam was performed according to the departmental dose-optimization program which includes automated exposure control, adjustment of the mA and/or kV according to patient size and/or use of iterative reconstruction technique. COMPARISON:  CT head 01/24/2021, MR head 01/20/2021 FINDINGS: CT HEAD FINDINGS Brain: There is no evidence of acute intracranial hemorrhage, extra-axial fluid collection, or acute infarct. A remote infarct in the left cerebellar hemisphere is unchanged. A remote cortical infarct in the left frontal lobe is unchanged. Multiple additional remote lacunar infarcts in the bilateral basal ganglia are also again seen. The background of parenchymal volume loss and chronic white matter microangiopathy is not significantly changed. There is no mass lesion. There is no midline shift. Vascular: There is calcification of the bilateral cavernous ICAs and vertebral arteries. Skull: Normal. Negative for fracture or focal lesion. Other: None. CT MAXILLOFACIAL FINDINGS Osseous: No fracture or mandibular dislocation. No destructive process. Orbits: The globes and orbits are unremarkable. Sinuses: The paranasal sinuses are clear. The mastoid air cells are clear. Soft tissues: Unremarkable. IMPRESSION: 1. No acute intracranial pathology. 2. No acute facial bone fracture. 3. Multiple remote infarcts as above, unchanged. Electronically Signed   By: Valetta Mole M.D.   On: 02/19/2021 13:25   CT Maxillofacial Wo Contrast  Result Date: 02/19/2021 CLINICAL DATA:  Facial trauma EXAM: CT HEAD WITHOUT CONTRAST CT MAXILLOFACIAL WITHOUT CONTRAST TECHNIQUE: Multidetector CT imaging of the head and maxillofacial structures were performed using the standard protocol without intravenous contrast. Multiplanar CT image reconstructions of the  maxillofacial structures were also generated. RADIATION DOSE REDUCTION: This exam was performed according to the departmental dose-optimization program which includes automated exposure control, adjustment of the mA and/or kV according to patient size and/or use of iterative reconstruction technique. COMPARISON:  CT head 01/24/2021, MR head 01/20/2021 FINDINGS: CT HEAD FINDINGS Brain: There is no evidence of acute intracranial hemorrhage, extra-axial fluid collection, or acute infarct. A remote infarct in the left cerebellar hemisphere is unchanged. A remote cortical infarct in the left frontal lobe is unchanged. Multiple additional remote lacunar infarcts in the bilateral basal ganglia are also  again seen. The background of parenchymal volume loss and chronic white matter microangiopathy is not significantly changed. There is no mass lesion. There is no midline shift. Vascular: There is calcification of the bilateral cavernous ICAs and vertebral arteries. Skull: Normal. Negative for fracture or focal lesion. Other: None. CT MAXILLOFACIAL FINDINGS Osseous: No fracture or mandibular dislocation. No destructive process. Orbits: The globes and orbits are unremarkable. Sinuses: The paranasal sinuses are clear. The mastoid air cells are clear. Soft tissues: Unremarkable. IMPRESSION: 1. No acute intracranial pathology. 2. No acute facial bone fracture. 3. Multiple remote infarcts as above, unchanged. Electronically Signed   By: Valetta Mole M.D.   On: 02/19/2021 13:25    EKG: Independently reviewed.  Vent. rate 153 BPM PR interval 104 ms QRS duration 78 ms QT/QTcB 294/469 ms P-R-T axes 76 76 -84 SVT  Assessment/Plan Principal Problem:   DKA (diabetic ketoacidosis) (HCC) Observation/stepdown. Keep NPO. Continue IV fluids Continue insulin infusion. Antiemetics as needed. Monitor BHA level Monitor and replenish electrolytes. Switched to SQ insulin per Endo tool.  Active Problems:   Tachycardia Likely  from beta-blocker withdrawal. Improving with metoprolol injections. Continue metoprolol 5 mg IVP every 6 hours. Resume oral metoprolol once tolerating p.o. intake.    Hyperthyroidism Resume methimazole once on diet.    Essential hypertension Metoprolol 5 mg IVP every 6 hours. Resume oral antihypertensives once tolerating diet. Monitor BP, HR, renal function electrolytes.    CAD (coronary artery disease) Resume statin, DAPT and beta-blocker after nausea subsides.    HLD (hyperlipidemia) Resume atorvastatin once cleared for oral intake.    CVA (cerebral vascular accident) ([place]) With left facial and extremity spasticity. Continue supportive care.     DVT prophylaxis: SCDs. Code Status:   Full code. Family Communication:   Disposition Plan:   Patient is from:  Skilled nursing facility.  Anticipated DC to:  Skilled nursing facility.  Anticipated DC date:  02/20/2021 or 02/21/2021.  Anticipated DC barriers: Clinical status.  Consults called:  TOC team. Admission status:  Observation/stepdown.  Severity of Illness: High severity in the setting of DKA, XPTM hypertensive urgency.  Reubin Milan MD [facility]  How to contact the [facility] Attending or Consulting provider [place] or covering provider during after hours [place], for this patient?   Check the care team in [facility] and look for a) attending/consulting [facility] provider listed and b) the [facility] team listed Log into [email or web address] and use [place]'s universal password to access. If you do not have the password, please contact the hospital operator. Locate the [facility] provider you are looking for under [facility] and page to a number that you can be directly reached. If you still have difficulty reaching the provider, please page the [facility] (Director on Call) for the Hospitalists listed on amion for assistance.  02/19/2021, 3:24 PM   This document was prepared in Dragon voice recognition software and may contain some  unintended transcription errors.

## 2021-02-19 NOTE — ED Notes (Signed)
Patient transported to CT via stretcher w/ CT tech. 

## 2021-02-19 NOTE — Progress Notes (Signed)
Peripherally Inserted Central Catheter Placement  The IV Nurse has discussed with the patient and/or persons authorized to consent for the patient, the purpose of this procedure and the potential benefits and risks involved with this procedure.  The benefits include less needle sticks, lab draws from the catheter, and the patient may be discharged home with the catheter. Risks include, but not limited to, infection, bleeding, blood clot (thrombus formation), and puncture of an artery; nerve damage and irregular heartbeat and possibility to perform a PICC exchange if needed/ordered by physician.  Alternatives to this procedure were also discussed.  Bard Power PICC patient education guide, fact sheet on infection prevention and patient information card has been provided to patient /or left at bedside.  Telephone consent obtained from daughter [name] due to altered mental status (extremely drowsy and difficult to arouse).   PICC Placement Documentation  PICC Double Lumen 68/25/74 PICC Right Basilic 38 cm 1 cm (Active)  Indication for Insertion or Continuance of Line Poor Vasculature-patient has had multiple peripheral attempts or PIVs lasting less than 24 hours;Limited venous access - need for IV therapy >5 days (PICC only);Chronic illness with exacerbations (CF, Sickle Cell, etc.) 02/19/21 2043  Exposed Catheter (cm) 1 cm 02/19/21 2043  Site Assessment Clean;Dry;Intact 02/19/21 2043  Lumen #1 Status Flushed;Saline locked;Blood return noted 02/19/21 2043  Lumen #2 Status Flushed;Saline locked;Blood return noted 02/19/21 2043  Dressing Type Transparent 02/19/21 2043  Dressing Status Clean;Dry;Intact 02/19/21 2043  Antimicrobial disc in place? Yes 02/19/21 2043  Safety Lock Not Applicable 93/55/21 7471  Line Care Connections checked and tightened 02/19/21 2043  Line Adjustment (NICU/IV Team Only) No 02/19/21 2043  Dressing Intervention New dressing 02/19/21 2043  Dressing Change Due 02/26/21 02/19/21  2043       Kameria Canizares, Nicolette Bang 02/19/2021, 8:45 PM

## 2021-02-20 DIAGNOSIS — Z8673 Personal history of transient ischemic attack (TIA), and cerebral infarction without residual deficits: Secondary | ICD-10-CM | POA: Diagnosis not present

## 2021-02-20 DIAGNOSIS — E785 Hyperlipidemia, unspecified: Secondary | ICD-10-CM | POA: Diagnosis present

## 2021-02-20 DIAGNOSIS — E1169 Type 2 diabetes mellitus with other specified complication: Secondary | ICD-10-CM | POA: Diagnosis not present

## 2021-02-20 DIAGNOSIS — E669 Obesity, unspecified: Secondary | ICD-10-CM | POA: Diagnosis not present

## 2021-02-20 DIAGNOSIS — I471 Supraventricular tachycardia: Secondary | ICD-10-CM | POA: Diagnosis not present

## 2021-02-20 DIAGNOSIS — E7849 Other hyperlipidemia: Secondary | ICD-10-CM

## 2021-02-20 DIAGNOSIS — D72829 Elevated white blood cell count, unspecified: Secondary | ICD-10-CM | POA: Diagnosis present

## 2021-02-20 DIAGNOSIS — Z6841 Body Mass Index (BMI) 40.0 and over, adult: Secondary | ICD-10-CM | POA: Diagnosis not present

## 2021-02-20 DIAGNOSIS — E0590 Thyrotoxicosis, unspecified without thyrotoxic crisis or storm: Secondary | ICD-10-CM

## 2021-02-20 DIAGNOSIS — R000 Tachycardia, unspecified: Secondary | ICD-10-CM

## 2021-02-20 DIAGNOSIS — F32A Depression, unspecified: Secondary | ICD-10-CM | POA: Diagnosis present

## 2021-02-20 DIAGNOSIS — E876 Hypokalemia: Secondary | ICD-10-CM | POA: Diagnosis not present

## 2021-02-20 DIAGNOSIS — R5381 Other malaise: Secondary | ICD-10-CM | POA: Diagnosis not present

## 2021-02-20 DIAGNOSIS — I2510 Atherosclerotic heart disease of native coronary artery without angina pectoris: Secondary | ICD-10-CM | POA: Diagnosis present

## 2021-02-20 DIAGNOSIS — I10 Essential (primary) hypertension: Secondary | ICD-10-CM | POA: Diagnosis present

## 2021-02-20 DIAGNOSIS — Y92129 Unspecified place in nursing home as the place of occurrence of the external cause: Secondary | ICD-10-CM | POA: Diagnosis not present

## 2021-02-20 DIAGNOSIS — Z20822 Contact with and (suspected) exposure to covid-19: Secondary | ICD-10-CM | POA: Diagnosis present

## 2021-02-20 DIAGNOSIS — K3184 Gastroparesis: Secondary | ICD-10-CM | POA: Diagnosis present

## 2021-02-20 DIAGNOSIS — R532 Functional quadriplegia: Secondary | ICD-10-CM | POA: Diagnosis present

## 2021-02-20 DIAGNOSIS — W19XXXA Unspecified fall, initial encounter: Secondary | ICD-10-CM | POA: Diagnosis present

## 2021-02-20 DIAGNOSIS — E1143 Type 2 diabetes mellitus with diabetic autonomic (poly)neuropathy: Secondary | ICD-10-CM | POA: Diagnosis present

## 2021-02-20 DIAGNOSIS — I639 Cerebral infarction, unspecified: Secondary | ICD-10-CM | POA: Diagnosis not present

## 2021-02-20 DIAGNOSIS — E1110 Type 2 diabetes mellitus with ketoacidosis without coma: Secondary | ICD-10-CM | POA: Diagnosis present

## 2021-02-20 DIAGNOSIS — I69365 Other paralytic syndrome following cerebral infarction, bilateral: Secondary | ICD-10-CM | POA: Diagnosis not present

## 2021-02-20 DIAGNOSIS — G8114 Spastic hemiplegia affecting left nondominant side: Secondary | ICD-10-CM | POA: Diagnosis not present

## 2021-02-20 DIAGNOSIS — S00511A Abrasion of lip, initial encounter: Secondary | ICD-10-CM | POA: Diagnosis present

## 2021-02-20 DIAGNOSIS — D638 Anemia in other chronic diseases classified elsewhere: Secondary | ICD-10-CM | POA: Diagnosis present

## 2021-02-20 DIAGNOSIS — I160 Hypertensive urgency: Secondary | ICD-10-CM | POA: Diagnosis present

## 2021-02-20 DIAGNOSIS — E871 Hypo-osmolality and hyponatremia: Secondary | ICD-10-CM | POA: Diagnosis not present

## 2021-02-20 DIAGNOSIS — K219 Gastro-esophageal reflux disease without esophagitis: Secondary | ICD-10-CM | POA: Diagnosis present

## 2021-02-20 DIAGNOSIS — F419 Anxiety disorder, unspecified: Secondary | ICD-10-CM | POA: Diagnosis present

## 2021-02-20 DIAGNOSIS — I672 Cerebral atherosclerosis: Secondary | ICD-10-CM | POA: Diagnosis present

## 2021-02-20 LAB — BASIC METABOLIC PANEL
Anion gap: 9 (ref 5–15)
BUN: 5 mg/dL — ABNORMAL LOW (ref 6–20)
CO2: 26 mmol/L (ref 22–32)
Calcium: 8.5 mg/dL — ABNORMAL LOW (ref 8.9–10.3)
Chloride: 102 mmol/L (ref 98–111)
Creatinine, Ser: 0.55 mg/dL (ref 0.44–1.00)
GFR, Estimated: >60 mL/min (ref >60)
Glucose, Bld: 174 mg/dL — ABNORMAL HIGH (ref 70–99)
Potassium: 4.1 mmol/L (ref 3.5–5.1)
Sodium: 137 mmol/L (ref 135–145)

## 2021-02-20 LAB — HEMOGLOBIN A1C
Hgb A1c MFr Bld: 9 % — ABNORMAL HIGH (ref 4.8–5.6)
Mean Plasma Glucose: 211.6 mg/dL

## 2021-02-20 LAB — GLUCOSE, CAPILLARY
Glucose-Capillary: 149 mg/dL — ABNORMAL HIGH (ref 70–99)
Glucose-Capillary: 164 mg/dL — ABNORMAL HIGH (ref 70–99)
Glucose-Capillary: 169 mg/dL — ABNORMAL HIGH (ref 70–99)
Glucose-Capillary: 172 mg/dL — ABNORMAL HIGH (ref 70–99)
Glucose-Capillary: 269 mg/dL — ABNORMAL HIGH (ref 70–99)
Glucose-Capillary: 296 mg/dL — ABNORMAL HIGH (ref 70–99)

## 2021-02-20 LAB — BETA-HYDROXYBUTYRIC ACID: Beta-Hydroxybutyric Acid: 0.47 mmol/L — ABNORMAL HIGH (ref 0.05–0.27)

## 2021-02-20 MED ORDER — ACETAMINOPHEN 325 MG PO TABS
650.0000 mg | ORAL_TABLET | Freq: Four times a day (QID) | ORAL | Status: DC | PRN
  Administered 2021-02-21: 650 mg via ORAL
  Filled 2021-02-20: qty 2

## 2021-02-20 MED ORDER — MELATONIN 3 MG PO TABS
9.0000 mg | ORAL_TABLET | Freq: Every day | ORAL | Status: DC
  Administered 2021-02-20 – 2021-02-25 (×6): 9 mg via ORAL
  Filled 2021-02-20 (×6): qty 3

## 2021-02-20 MED ORDER — METOCLOPRAMIDE HCL 5 MG/ML IJ SOLN
10.0000 mg | Freq: Three times a day (TID) | INTRAMUSCULAR | Status: DC
  Administered 2021-02-20 – 2021-02-26 (×18): 10 mg via INTRAVENOUS
  Filled 2021-02-20 (×18): qty 2

## 2021-02-20 MED ORDER — ESCITALOPRAM OXALATE 20 MG PO TABS
20.0000 mg | ORAL_TABLET | Freq: Every day | ORAL | Status: DC
  Administered 2021-02-20 – 2021-02-26 (×7): 20 mg via ORAL
  Filled 2021-02-20 (×7): qty 1

## 2021-02-20 MED ORDER — METOCLOPRAMIDE HCL 5 MG/ML IJ SOLN: 10.0000 mg | Freq: Three times a day (TID) | INTRAMUSCULAR | Status: DC

## 2021-02-20 MED ORDER — INSULIN GLARGINE-YFGN 100 UNIT/ML ~~LOC~~ SOLN
10.0000 [IU] | Freq: Every day | SUBCUTANEOUS | Status: DC
  Administered 2021-02-20 – 2021-02-24 (×5): 10 [IU] via SUBCUTANEOUS
  Filled 2021-02-20 (×7): qty 0.1

## 2021-02-20 MED ORDER — METOPROLOL TARTRATE 50 MG PO TABS
100.0000 mg | ORAL_TABLET | Freq: Two times a day (BID) | ORAL | Status: DC
  Administered 2021-02-20 – 2021-02-26 (×12): 100 mg via ORAL
  Filled 2021-02-20: qty 2
  Filled 2021-02-20: qty 4
  Filled 2021-02-20 (×6): qty 2
  Filled 2021-02-20: qty 4
  Filled 2021-02-20 (×4): qty 2

## 2021-02-20 MED ORDER — LIP MEDEX EX OINT
TOPICAL_OINTMENT | CUTANEOUS | Status: DC | PRN
  Administered 2021-02-21: 75 via TOPICAL
  Filled 2021-02-20 (×3): qty 7

## 2021-02-20 MED ORDER — GABAPENTIN 100 MG PO CAPS
100.0000 mg | ORAL_CAPSULE | Freq: Three times a day (TID) | ORAL | Status: DC
  Administered 2021-02-20 – 2021-02-26 (×17): 100 mg via ORAL
  Filled 2021-02-20 (×17): qty 1

## 2021-02-20 MED ORDER — CLOPIDOGREL BISULFATE 75 MG PO TABS
75.0000 mg | ORAL_TABLET | Freq: Every day | ORAL | Status: DC
  Administered 2021-02-20 – 2021-02-26 (×7): 75 mg via ORAL
  Filled 2021-02-20 (×7): qty 1

## 2021-02-20 MED ORDER — SENNA 8.6 MG PO TABS
1.0000 | ORAL_TABLET | Freq: Every day | ORAL | Status: DC
  Administered 2021-02-20 – 2021-02-25 (×6): 8.6 mg via ORAL
  Filled 2021-02-20 (×6): qty 1

## 2021-02-20 MED ORDER — CYCLOBENZAPRINE HCL 5 MG PO TABS
5.0000 mg | ORAL_TABLET | Freq: Three times a day (TID) | ORAL | Status: DC | PRN
  Administered 2021-02-24 – 2021-02-26 (×2): 5 mg via ORAL
  Filled 2021-02-20 (×2): qty 1

## 2021-02-20 MED ORDER — METHIMAZOLE 5 MG PO TABS
5.0000 mg | ORAL_TABLET | Freq: Every day | ORAL | Status: DC
  Administered 2021-02-21 – 2021-02-26 (×6): 5 mg via ORAL
  Filled 2021-02-20 (×6): qty 1

## 2021-02-20 MED ORDER — PANTOPRAZOLE SODIUM 40 MG PO TBEC: 40.0000 mg | DELAYED_RELEASE_TABLET | Freq: Every day | ORAL | Status: DC

## 2021-02-20 MED ORDER — AMLODIPINE BESYLATE 5 MG PO TABS
5.0000 mg | ORAL_TABLET | Freq: Every day | ORAL | Status: DC
  Administered 2021-02-20 – 2021-02-26 (×7): 5 mg via ORAL
  Filled 2021-02-20 (×7): qty 1

## 2021-02-20 MED ORDER — LISINOPRIL 20 MG PO TABS
20.0000 mg | ORAL_TABLET | Freq: Every day | ORAL | Status: DC
  Administered 2021-02-20 – 2021-02-26 (×7): 20 mg via ORAL
  Filled 2021-02-20: qty 2
  Filled 2021-02-20 (×5): qty 1
  Filled 2021-02-20: qty 2

## 2021-02-20 MED ORDER — ONDANSETRON HCL 4 MG/2ML IJ SOLN
4.0000 mg | Freq: Four times a day (QID) | INTRAMUSCULAR | Status: DC | PRN
  Administered 2021-02-20 – 2021-02-25 (×6): 4 mg via INTRAVENOUS
  Filled 2021-02-20 (×6): qty 2

## 2021-02-20 MED ORDER — LORAZEPAM 0.5 MG PO TABS
0.2500 mg | ORAL_TABLET | Freq: Every day | ORAL | Status: DC
  Administered 2021-02-20 – 2021-02-25 (×6): 0.25 mg via ORAL
  Filled 2021-02-20 (×6): qty 1

## 2021-02-20 MED ORDER — POLYETHYLENE GLYCOL 3350 17 G PO PACK
17.0000 g | PACK | Freq: Two times a day (BID) | ORAL | Status: DC
  Administered 2021-02-20 – 2021-02-26 (×9): 17 g via ORAL
  Filled 2021-02-20 (×10): qty 1

## 2021-02-20 NOTE — Evaluation (Signed)
Clinical/Bedside Swallow Evaluation Patient Details  Name: Lori [name] MRN: [id number] Date of Birth: Aug 16, 1970  Today's Date: 02/20/2021 Time: SLP Start Time (ACUTE ONLY): 27 SLP Stop Time (ACUTE ONLY): 1145 SLP Time Calculation (min) (ACUTE ONLY): 29 min  Past Medical History:  Past Medical History:  Diagnosis Date   CAD (coronary artery disease)    Depression    Diabetes mellitus without complication ([place])    Hypertension    Pheochromocytoma    Pheochromocytoma    Stroke ([place])    Thyroid disease    Past Surgical History:  Past Surgical History:  Procedure Laterality Date   CORONARY ARTERY BYPASS GRAFT     Right adrenal gland removal for pheochromocytoma Right    HPI:  Pt is a 51 yo female adm to [facility] from SNF with DKA, tachycardia and hypertensive urgency.  Per pt, she did not receive her medications while at SNF over the weekend.  Swallow eval ordered as per RN, pt coughing with liquid intake.   Upon admit, CXR negative, pt passed 3 ounce Yale water challenge.  CT head negative for acute event.  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 She was recently in the hospital for aphasia, nausea and vomiting.  Pt reports to this SLP that she has h/o 22 strokes, first being in 2014.  Diet at SNF is regular/thin.  During prior admit, pt with soft tissue neck edema concerning for infection- but imaging study was negative. Pt underwent MBS during last admit - with recommendation for dys2/nectar diet - per SLP from 12/22 visit, pt advised she would not use thickened liquids.    Assessment / Plan / Recommendation  Clinical Impression  Limited asessment due to pt nausea/vomiting during session.  Lingual protrusion noted at baseline, pt can seal lips with cue.   Left proximal labia noted with abrasion - healing - that may contribute to decreased labial closure. SLP provided pt with intake of thin and nectar thick gingerale *due to her nausea* via straw.  Swallow clinically judged to ve timely without clinical indication of aspiration *no cough associated with intake.  Clinically pt remains slightly hypernasal, moderately dysarthric with mildly impaired phonatory strength.  She advises that her "normal voice" is higher than currently.  Pt has h/o trace silent aspiration *SLP reviewed prior video loops from prior study* - currently CXR negative and pt is on regular/thin diet at SNF.  Recommend to consider changing to clear liquids and give medications with puree - whole.  Advise strict precautions. If pt overtly coughing with thin, recommend use of nectar liquids.  SLP will follow up for dysphagia management. SLP Visit Diagnosis: Dysphagia, unspecified (R13.10)    Aspiration Risk  Moderate aspiration risk;Risk for inadequate nutrition/hydration    Diet Recommendation  clears   Supervision: Staff to assist with self feeding;Intermittent supervision to cue for compensatory strategies Compensations: Minimize environmental distractions;Slow rate;Small sips/bites;Lingual sweep for clearance of pocketing;Multiple dry swallows after each bite/sip;Follow solids with liquid;Clear throat intermittently    Other  Recommendations Oral Care Recommendations: Oral care BID;Oral care before and after PO;Patient independent with oral care    Recommendations for follow up therapy are one component of a multi-disciplinary discharge planning process, led by the attending physician.  Recommendations may be updated based on patient status, additional functional criteria and insurance authorization.  Follow up Recommendations Skilled nursing-short term rehab (<3 hours/day)      Assistance Recommended at Discharge Frequent or constant Supervision/Assistance  Functional Status  Assessment Patient has had a recent decline in their functional status and demonstrates the ability to make significant improvements in function in a reasonable and predictable amount of time.  Frequency and Duration min 2x/week  1 week       Prognosis Prognosis for Safe Diet Advancement: Fair Barriers to Reach Goals: Cognitive deficits;Time post onset;Other (Comment)      Swallow Study   General HPI: Pt is a 51 yo female adm to [facility] from SNF with DKA, tachycardia and hypertensive urgency.  Per pt, she did not receive her medications while at SNF over the weekend.  Swallow eval ordered as per RN, pt coughing with liquid intake.   Upon admit, CXR negative, pt passed 3 ounce Yale water challenge.  CT head negative for acute event.  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 She was recently in the hospital for aphasia, nausea and vomiting.  Pt reports to this SLP that she has h/o 22 strokes, first being in 2014.  Diet at SNF is regular/thin.  During prior admit, pt with soft tissue neck edema concerning for infection- but imaging study was negative. Pt underwent MBS during last admit - with recommendation for dys2/nectar diet - per SLP from 12/22 visit, pt advised she would not use thickened liquids. Type of Study: Bedside Swallow Evaluation Previous Swallow Assessment: MBS 01-23-21 showed silent aspiration of thin, 01/31/2021 trace silent aspiration of thin Diet Prior to this Study: Regular;Thin liquids Temperature Spikes Noted: No Respiratory Status: Room air History of Recent Intubation: No Behavior/Cognition: Alert Oral Cavity Assessment: Erythema;Within Functional Limits Oral Care Completed by SLP: No Oral Cavity - Dentition: Missing dentition (no molars, no  lowers) Vision: Functional for self-feeding Self-Feeding Abilities: Needs assist Patient Positioning: Upright in bed Baseline Vocal Quality: Hoarse;Low vocal intensity Volitional Cough: Other (Comment) (NT due to pt having frequent emesis) Volitional Swallow: Able to elicit    Oral/Motor/Sensory Function Overall Oral Motor/Sensory Function: Moderate impairment Facial ROM: Other (Comment) (pt with abrasion healing on left upper labia, lingual protrusion noted at baseline but pt able to seal lips with cues) Facial Symmetry: Within Functional Limits Facial Strength: Within Functional Limits Lingual ROM: Suspected CN XII (hypoglossal) dysfunction (slow, reduced) Lingual Symmetry: Within Functional Limits Lingual Strength: Reduced;Suspected CN XII (hypoglossal) dysfunction Lingual Sensation: Other (Comment) Velum: Other (comment);Suspected CN X (Vagus) dysfunction (? hypernasal quality to speech) Mandible: Within Functional Limits   Ice Chips Ice chips: Not tested   Thin Liquid Thin Liquid: Impaired Presentation: Straw Oral Phase Impairments: Reduced labial seal Other Comments: minimal reduced labial seal but with cue pt able to seal lips    Nectar Thick Nectar Thick Liquid: Impaired Presentation: Straw Oral Phase Impairments: Reduced labial seal   Honey Thick Honey Thick Liquid: Not tested   Puree Puree: Not tested Other Comments: pt vomiting thus did not provide solids   Solid     Solid: Not tested Other Comments: pt vomiting, thus did not provide solids      Macario Golds 02/20/2021,12:20 PM  Kathleen Lime, MS [facility] SLP Acute Rehab Services Office [phone] Cell [phone]

## 2021-02-20 NOTE — Progress Notes (Signed)
Inpatient Diabetes Program Recommendations  AACE/ADA: New Consensus Statement on Inpatient Glycemic Control (2015)  Target Ranges:  Prepandial:   less than 140 mg/dL      Peak postprandial:   less than 180 mg/dL (1-2 hours)      Critically ill patients:  140 - 180 mg/dL   Lab Results  Component Value Date   GLUCAP 296 (H) 02/20/2021   HGBA1C 9.0 (H) 02/19/2021    Review of Glycemic Control  Diabetes history: DM2 Outpatient Diabetes medications: Lantus 25 QD Current orders for Inpatient glycemic control: Semglee 10 QD, Novolog 0-15 TID with meals and 0-5 HS  HgbA1C - 9.0% On CL diet now. Will likely need meal coverage insulin ? of whether pt has been receiving Lantus dose prior to admission.  Inpatient Diabetes Program Recommendations:    Add Novolog 3 units TID with meals if eating > 50% meal  Will likely need meal coverage when discharged to SNF.  Continue to follow glucose trends.  Thank you. Lorenda Peck, RD, LDN, CDE Inpatient Diabetes Coordinator [phone]

## 2021-02-20 NOTE — Progress Notes (Addendum)
PROGRESS NOTE  Lori [name] PNT:[id number] DOB: May 13, 1970 DOA: 02/19/2021 PCP: Pcp, No   LOS: 0 days   Brief narrative:  Lori [name] is a 51 y.o. female with past medical history significant of CAD, depression, type II DM, hypertension, pheochromocytoma, bedbound status due to history of CVA with upper extremity contracture bilateral lower extremity weakness, dysarthria presented to hospital from [facility] with multiple episodes of vomiting.  It was informed that that she did not receive her medication including insulin at the skilled nursing facility.   Initial CBC showed WBC at 11.8 with 82% neutrophils.  CT head scan was negative for acute findings but multiple remote lacunar infarcts.In the ED, her initial vitals showed pressure of 99.2, pulse of 152.  Patient was started on insulin drip Cardizem was given and patient received IV fluid bolus.  Patient was then admitted hospital for further evaluation and treatment.  Assessment/Plan:  Principal Problem:   DKA (diabetic ketoacidosis) ([place]) Active Problems:   Tachycardia   Hyperthyroidism   Essential hypertension   CAD (coronary artery disease)   HLD (hyperlipidemia)   CVA (cerebral vascular accident) ([place])   Diabetic Ketoacidosis Has improved at this time after insulin drip.  Continue sliding scale insulin long-acting insulin.  Continue IV fluids for now.       Hyperthyroidism Resume methimazole once on diet.  Metoprolol IV every 6hrly.  Resume oral metoprolol when PO ok     Essential hypertension Metoprolol 5 mg IVP every 6 hours.  Oral metoprolol when stable.    CAD (coronary artery disease) Dual antiplatelets beta-blocker and statin on hold currently due to nausea and vomiting.     HLD (hyperlipidemia) Hold Lipitor for now.     CVA (cerebral vascular accident)  With left facial and extremity spasticity.  Continue supportive care. Continue supportive care.  Speech therapy on board and recommend clears  at this time.  Disposition: Patient's daughter feels frustrated that the patient did not get any medications including insulin at the current nursing facility and wishes her mom to be discharged to a new skilled nursing facility.  TOC has been consulted regarding this   DVT prophylaxis: SCDs Start: 02/19/21 1527   Code Status: Full code  Family Communication:  I spoke with the patient's daughter on the phone and updated her about the clinical condition of the patient.  Status is: Observation  The patient will require care spanning > 2 midnights and should be moved to inpatient because: DKA, need for placement.  Consultants: None  Procedures: None  Anti-infectives:  None  Anti-infectives (From admission, onward)    None      Subjective: Today, patient was seen and examined at bedside.  Alert awake and comprehensive.  Did not verbalize much.  Denies any pain, cough or shortness of breath.  Nursing staff reported nausea and vomiting  Objective: Vitals:   02/20/21 1000 02/20/21 1100  BP: (!) 168/122   Pulse: (!) 104   Resp: 11   Temp:  97.6 F (36.4 C)  SpO2: 98%     Intake/Output Summary (Last 24 hours) at 02/20/2021 1249 Last data filed at 02/20/2021 1136 Gross per 24 hour  Intake 2679.35 ml  Output 650 ml  Net 2029.35 ml   Filed Weights   02/19/21 1039  Weight: 89.8 kg   Body mass index is 36.21 kg/m.   Physical Exam: GENERAL: Patient is alert awake and able to comprehend.  Did not verbalize with me, not in obvious distress.  Obese  HENT: No scleral pallor or icterus. Pupils equally reactive to light. Oral mucosa is dry NECK: is supple, no gross swelling noted. CHEST:  Diminished breath sounds bilaterally. CVS: S1 and S2 heard, no murmur. Regular rate and rhythm.  Mild tachycardia. ABDOMEN: Soft, non-tender, bowel sounds are present. EXTREMITIES: No edema. CNS: Contracture of the upper extremities with bilateral lower extremity weakness,  dysarthria, SKIN: warm and dry without rashes.  Data Review: I have personally reviewed the following laboratory data and studies,  CBC: Recent Labs  Lab 02/14/21 1114 02/19/21 1044  WBC 11.8* 11.8*  NEUTROABS 8.7* 9.6*  HGB 12.8 13.1  HCT 37.5 39.7  MCV 80.5 82.4  PLT 420* 704*   Basic Metabolic Panel: Recent Labs  Lab 02/14/21 1114 02/19/21 1044 02/19/21 1606 02/19/21 2130 02/20/21 0511  NA 135 136 139 138 137  K 3.8 3.7 3.9 3.3* 4.1  CL 100 94* 100 102 102  CO2 23 26 30 29 26   GLUCOSE 238* 383* 163* 133* 174*  BUN 8 8 7 6  5*  CREATININE 0.67 0.86 0.70 0.60 0.55  CALCIUM 8.9 9.1 8.8* 8.4* 8.5*  MG  --   --  2.6*  --   --    Liver Function Tests: Recent Labs  Lab 02/14/21 1114 02/19/21 1044  AST 13* 15  ALT 16 23  ALKPHOS 94 94  BILITOT 0.9 0.9  PROT 7.8 8.4*  ALBUMIN 3.3* 3.9   Recent Labs  Lab 02/19/21 1044  LIPASE 27   No results for input(s): AMMONIA in the last 168 hours. Cardiac Enzymes: No results for input(s): CKTOTAL, CKMB, CKMBINDEX, TROPONINI in the last 168 hours. BNP (last 3 results) No results for input(s): BNP in the last 8760 hours.  ProBNP (last 3 results) No results for input(s): PROBNP in the last 8760 hours.  CBG: Recent Labs  Lab 02/19/21 2331 02/20/21 0029 02/20/21 0131 02/20/21 0750 02/20/21 1219  GLUCAP 157* 164* 169* 172* 296*   Recent Results (from the past 240 hour(s))  Resp Panel by RT-PCR (Flu A&B, Covid) Nasopharyngeal Swab     Status: None   Collection Time: 02/11/21  1:17 PM   Specimen: Nasopharyngeal Swab; Nasopharyngeal(NP) swabs in vial transport medium  Result Value Ref Range Status   SARS Coronavirus 2 by RT PCR NEGATIVE NEGATIVE Final    Comment: (NOTE) SARS-CoV-2 target nucleic acids are NOT DETECTED.  The SARS-CoV-2 RNA is generally detectable in upper respiratory specimens during the acute phase of infection. The lowest concentration of SARS-CoV-2 viral copies this assay can detect is 138  copies/mL. A negative result does not preclude SARS-Cov-2 infection and should not be used as the sole basis for treatment or other patient management decisions. A negative result may occur with  improper specimen collection/handling, submission of specimen other than nasopharyngeal swab, presence of viral mutation(s) within the areas targeted by this assay, and inadequate number of viral copies(<138 copies/mL). A negative result must be combined with clinical observations, patient history, and epidemiological information. The expected result is Negative.  Fact Sheet for Patients:  EntrepreneurPulse.com.au  Fact Sheet for Healthcare Providers:  IncredibleEmployment.be  This test is no t yet approved or cleared by the [place] FDA and  has been authorized for detection and/or diagnosis of SARS-CoV-2 by FDA under an Emergency Use Authorization (EUA). This EUA will remain  in effect (meaning this test can be used) for the duration of the COVID-19 declaration under Section 564(b)(1) of the Act, 21 U.S.C.section 360bbb-3(b)(1), unless the authorization is  terminated  or revoked sooner.       Influenza A by PCR NEGATIVE NEGATIVE Final   Influenza B by PCR NEGATIVE NEGATIVE Final    Comment: (NOTE) The Xpert Xpress SARS-CoV-2/FLU/RSV plus assay is intended as an aid in the diagnosis of influenza from Nasopharyngeal swab specimens and should not be used as a sole basis for treatment. Nasal washings and aspirates are unacceptable for Xpert Xpress SARS-CoV-2/FLU/RSV testing.  Fact Sheet for Patients: EntrepreneurPulse.com.au  Fact Sheet for Healthcare Providers: IncredibleEmployment.be  This test is not yet approved or cleared by the [place] FDA and has been authorized for detection and/or diagnosis of SARS-CoV-2 by FDA under an Emergency Use Authorization (EUA). This EUA will remain in effect (meaning  this test can be used) for the duration of the COVID-19 declaration under Section 564(b)(1) of the Act, 21 U.S.C. section 360bbb-3(b)(1), unless the authorization is terminated or revoked.  Performed at [facility], [place] [address] [place], [place] [address]   Resp Panel by RT-PCR (Flu A&B, Covid) Nasopharyngeal Swab     Status: None   Collection Time: 02/19/21  2:39 PM   Specimen: Nasopharyngeal Swab; Nasopharyngeal(NP) swabs in vial transport medium  Result Value Ref Range Status   SARS Coronavirus 2 by RT PCR NEGATIVE NEGATIVE Final    Comment: (NOTE) SARS-CoV-2 target nucleic acids are NOT DETECTED.  The SARS-CoV-2 RNA is generally detectable in upper respiratory specimens during the acute phase of infection. The lowest concentration of SARS-CoV-2 viral copies this assay can detect is 138 copies/mL. A negative result does not preclude SARS-Cov-2 infection and should not be used as the sole basis for treatment or other patient management decisions. A negative result may occur with  improper specimen collection/handling, submission of specimen other than nasopharyngeal swab, presence of viral mutation(s) within the areas targeted by this assay, and inadequate number of viral copies(<138 copies/mL). A negative result must be combined with clinical observations, patient history, and epidemiological information. The expected result is Negative.  Fact Sheet for Patients:  EntrepreneurPulse.com.au  Fact Sheet for Healthcare Providers:  IncredibleEmployment.be  This test is no t yet approved or cleared by the [place] FDA and  has been authorized for detection and/or diagnosis of SARS-CoV-2 by FDA under an Emergency Use Authorization (EUA). This EUA will remain  in effect (meaning this test can be used) for the duration of the COVID-19 declaration under Section 564(b)(1) of the Act, 21 U.S.C.section 360bbb-3(b)(1), unless  the authorization is terminated  or revoked sooner.       Influenza A by PCR NEGATIVE NEGATIVE Final   Influenza B by PCR NEGATIVE NEGATIVE Final    Comment: (NOTE) The Xpert Xpress SARS-CoV-2/FLU/RSV plus assay is intended as an aid in the diagnosis of influenza from Nasopharyngeal swab specimens and should not be used as a sole basis for treatment. Nasal washings and aspirates are unacceptable for Xpert Xpress SARS-CoV-2/FLU/RSV testing.  Fact Sheet for Patients: EntrepreneurPulse.com.au  Fact Sheet for Healthcare Providers: IncredibleEmployment.be  This test is not yet approved or cleared by the [place] FDA and has been authorized for detection and/or diagnosis of SARS-CoV-2 by FDA under an Emergency Use Authorization (EUA). This EUA will remain in effect (meaning this test can be used) for the duration of the COVID-19 declaration under Section 564(b)(1) of the Act, 21 U.S.C. section 360bbb-3(b)(1), unless the authorization is terminated or revoked.  Performed at [facility], [place] [address], [place], [place] [address]   MRSA Next  Gen by PCR, Nasal     Status: None   Collection Time: 02/19/21  4:52 PM   Specimen: Nasal Mucosa; Nasal Swab  Result Value Ref Range Status   MRSA by PCR Next Gen NOT DETECTED NOT DETECTED Final    Comment: (NOTE) The GeneXpert MRSA Assay (FDA approved for NASAL specimens only), is one component of a comprehensive MRSA colonization surveillance program. It is not intended to diagnose MRSA infection nor to guide or monitor treatment for MRSA infections. Test performance is not FDA approved in patients less than 77 years old. Performed at [facility], [place] [address], [place], [place] [address]      Studies: CT Head Wo Contrast  Result Date: 02/19/2021 CLINICAL DATA:  Facial trauma EXAM: CT HEAD WITHOUT CONTRAST CT MAXILLOFACIAL WITHOUT CONTRAST TECHNIQUE:  Multidetector CT imaging of the head and maxillofacial structures were performed using the standard protocol without intravenous contrast. Multiplanar CT image reconstructions of the maxillofacial structures were also generated. RADIATION DOSE REDUCTION: This exam was performed according to the departmental dose-optimization program which includes automated exposure control, adjustment of the mA and/or kV according to patient size and/or use of iterative reconstruction technique. COMPARISON:  CT head 01/24/2021, MR head 01/20/2021 FINDINGS: CT HEAD FINDINGS Brain: There is no evidence of acute intracranial hemorrhage, extra-axial fluid collection, or acute infarct. A remote infarct in the left cerebellar hemisphere is unchanged. A remote cortical infarct in the left frontal lobe is unchanged. Multiple additional remote lacunar infarcts in the bilateral basal ganglia are also again seen. The background of parenchymal volume loss and chronic white matter microangiopathy is not significantly changed. There is no mass lesion. There is no midline shift. Vascular: There is calcification of the bilateral cavernous ICAs and vertebral arteries. Skull: Normal. Negative for fracture or focal lesion. Other: None. CT MAXILLOFACIAL FINDINGS Osseous: No fracture or mandibular dislocation. No destructive process. Orbits: The globes and orbits are unremarkable. Sinuses: The paranasal sinuses are clear. The mastoid air cells are clear. Soft tissues: Unremarkable. IMPRESSION: 1. No acute intracranial pathology. 2. No acute facial bone fracture. 3. Multiple remote infarcts as above, unchanged. Electronically Signed   By: Valetta Mole M.D.   On: 02/19/2021 13:25   [place] EKG SITE RITE  Result Date: 02/19/2021 If Site Rite image not attached, placement could not be confirmed due to current cardiac rhythm.  CT Maxillofacial Wo Contrast  Result Date: 02/19/2021 CLINICAL DATA:  Facial trauma EXAM: CT HEAD WITHOUT CONTRAST CT  MAXILLOFACIAL WITHOUT CONTRAST TECHNIQUE: Multidetector CT imaging of the head and maxillofacial structures were performed using the standard protocol without intravenous contrast. Multiplanar CT image reconstructions of the maxillofacial structures were also generated. RADIATION DOSE REDUCTION: This exam was performed according to the departmental dose-optimization program which includes automated exposure control, adjustment of the mA and/or kV according to patient size and/or use of iterative reconstruction technique. COMPARISON:  CT head 01/24/2021, MR head 01/20/2021 FINDINGS: CT HEAD FINDINGS Brain: There is no evidence of acute intracranial hemorrhage, extra-axial fluid collection, or acute infarct. A remote infarct in the left cerebellar hemisphere is unchanged. A remote cortical infarct in the left frontal lobe is unchanged. Multiple additional remote lacunar infarcts in the bilateral basal ganglia are also again seen. The background of parenchymal volume loss and chronic white matter microangiopathy is not significantly changed. There is no mass lesion. There is no midline shift. Vascular: There is calcification of the bilateral cavernous ICAs and vertebral arteries. Skull: Normal. Negative for fracture or focal  lesion. Other: None. CT MAXILLOFACIAL FINDINGS Osseous: No fracture or mandibular dislocation. No destructive process. Orbits: The globes and orbits are unremarkable. Sinuses: The paranasal sinuses are clear. The mastoid air cells are clear. Soft tissues: Unremarkable. IMPRESSION: 1. No acute intracranial pathology. 2. No acute facial bone fracture. 3. Multiple remote infarcts as above, unchanged. Electronically Signed   By: Valetta Mole M.D.   On: 02/19/2021 13:25      Flora Lipps, MD  [facility] 02/20/2021  If 7PM-7AM, please contact night-coverage

## 2021-02-20 NOTE — Plan of Care (Signed)
°  Problem: Education: Goal: Ability to describe self-care measures that may prevent or decrease complications (Diabetes Survival Skills Education) will improve Outcome: Progressing Goal: Individualized Educational Video(s) Outcome: Progressing   Problem: Cardiac: Goal: Ability to maintain an adequate cardiac output will improve Outcome: Progressing   Problem: Health Behavior/Discharge Planning: Goal: Ability to identify and utilize available resources and services will improve Outcome: Progressing Goal: Ability to manage health-related needs will improve Outcome: Progressing   Problem: Fluid Volume: Goal: Ability to achieve a balanced intake and output will improve Outcome: Progressing   Problem: Metabolic: Goal: Ability to maintain appropriate glucose levels will improve Outcome: Progressing   Problem: Nutritional: Goal: Maintenance of adequate nutrition will improve Outcome: Progressing Goal: Maintenance of adequate weight for body size and type will improve Outcome: Progressing   Problem: Respiratory: Goal: Will regain and/or maintain adequate ventilation Outcome: Progressing   Problem: Urinary Elimination: Goal: Ability to achieve and maintain adequate renal perfusion and functioning will improve Outcome: Progressing   Problem: Education: Goal: Knowledge of General Education information will improve Description: Including pain rating scale, medication(s)/side effects and non-pharmacologic comfort measures Outcome: Progressing   Problem: Health Behavior/Discharge Planning: Goal: Ability to manage health-related needs will improve Outcome: Progressing   Problem: Clinical Measurements: Goal: Ability to maintain clinical measurements within normal limits will improve Outcome: Progressing Goal: Will remain free from infection Outcome: Progressing Goal: Diagnostic test results will improve Outcome: Progressing Goal: Respiratory complications will improve Outcome:  Progressing Goal: Cardiovascular complication will be avoided Outcome: Progressing   Problem: Activity: Goal: Risk for activity intolerance will decrease Outcome: Progressing   Problem: Nutrition: Goal: Adequate nutrition will be maintained Outcome: Progressing   Problem: Coping: Goal: Level of anxiety will decrease Outcome: Progressing   Problem: Elimination: Goal: Will not experience complications related to bowel motility Outcome: Progressing Goal: Will not experience complications related to urinary retention Outcome: Progressing   Problem: Pain Managment: Goal: General experience of comfort will improve Outcome: Progressing   Problem: Safety: Goal: Ability to remain free from injury will improve Outcome: Progressing   Problem: Skin Integrity: Goal: Risk for impaired skin integrity will decrease Outcome: Progressing

## 2021-02-21 LAB — PHOSPHORUS: Phosphorus: 4.3 mg/dL (ref 2.5–4.6)

## 2021-02-21 LAB — CBC
HCT: 36.8 % (ref 36.0–46.0)
Hemoglobin: 11.9 g/dL — ABNORMAL LOW (ref 12.0–15.0)
MCH: 27 pg (ref 26.0–34.0)
MCHC: 32.3 g/dL (ref 30.0–36.0)
MCV: 83.6 fL (ref 80.0–100.0)
Platelets: 547 10*3/uL — ABNORMAL HIGH (ref 150–400)
RBC: 4.4 MIL/uL (ref 3.87–5.11)
RDW: 15.9 % — ABNORMAL HIGH (ref 11.5–15.5)
WBC: 11.1 10*3/uL — ABNORMAL HIGH (ref 4.0–10.5)
nRBC: 0 % (ref 0.0–0.2)

## 2021-02-21 LAB — COMPREHENSIVE METABOLIC PANEL
ALT: 18 U/L (ref 0–44)
AST: 12 U/L — ABNORMAL LOW (ref 15–41)
Albumin: 3.3 g/dL — ABNORMAL LOW (ref 3.5–5.0)
Alkaline Phosphatase: 72 U/L (ref 38–126)
Anion gap: 8 (ref 5–15)
BUN: 7 mg/dL (ref 6–20)
CO2: 27 mmol/L (ref 22–32)
Calcium: 8.6 mg/dL — ABNORMAL LOW (ref 8.9–10.3)
Chloride: 99 mmol/L (ref 98–111)
Creatinine, Ser: 0.59 mg/dL (ref 0.44–1.00)
GFR, Estimated: >60 mL/min (ref >60)
Glucose, Bld: 215 mg/dL — ABNORMAL HIGH (ref 70–99)
Potassium: 3.5 mmol/L (ref 3.5–5.1)
Sodium: 134 mmol/L — ABNORMAL LOW (ref 135–145)
Total Bilirubin: 0.6 mg/dL (ref 0.3–1.2)
Total Protein: 7 g/dL (ref 6.5–8.1)

## 2021-02-21 LAB — GLUCOSE, CAPILLARY
Glucose-Capillary: 217 mg/dL — ABNORMAL HIGH (ref 70–99)
Glucose-Capillary: 250 mg/dL — ABNORMAL HIGH (ref 70–99)
Glucose-Capillary: 207 mg/dL — ABNORMAL HIGH (ref 70–99)
Glucose-Capillary: 109 mg/dL — ABNORMAL HIGH (ref 70–99)

## 2021-02-21 LAB — MAGNESIUM: Magnesium: 1.7 mg/dL (ref 1.7–2.4)

## 2021-02-21 MED ORDER — METOPROLOL TARTRATE 5 MG/5ML IV SOLN
2.5000 mg | Freq: Once | INTRAVENOUS | Status: AC
  Administered 2021-02-21: 2.5 mg via INTRAVENOUS
  Filled 2021-02-21: qty 5

## 2021-02-21 MED ORDER — GLUCERNA SHAKE PO LIQD
237.0000 mL | Freq: Three times a day (TID) | ORAL | Status: DC
  Administered 2021-02-21 – 2021-02-25 (×11): 237 mL via ORAL
  Filled 2021-02-21 (×18): qty 237

## 2021-02-21 MED ORDER — METOPROLOL TARTRATE 5 MG/5ML IV SOLN
2.5000 mg | Freq: Four times a day (QID) | INTRAVENOUS | Status: DC
  Administered 2021-02-21 (×2): 2.5 mg via INTRAVENOUS
  Filled 2021-02-21 (×5): qty 5

## 2021-02-21 NOTE — Progress Notes (Signed)
° ° °  OVERNIGHT PROGRESS REPORT  Notified by RN for loss of dose of meds due to emesis at time of administration.  Due to time elapsed and loss of med a dose of IV beta blocker was ordered until emesis has abated.   Gershon Cull MSNA MSN ACNPC-AG Acute Care Nurse Practitioner [place]

## 2021-02-21 NOTE — Progress Notes (Addendum)
PROGRESS NOTE  Lori [name] DXI:[id number] DOB: 1970-11-25 DOA: 02/19/2021 PCP: Pcp, No   LOS: 1 day   Brief narrative: Lori [name] is a 51 y.o. female with past medical history significant of CAD, depression, type II DM, hypertension, pheochromocytoma, bedbound status due to history of CVA with upper extremity contracture bilateral lower extremity weakness, dysarthria presented to the hospital from [facility] with multiple episodes of vomiting.  It was informed that that she did not receive her medication including insulin at the skilled nursing facility.   Initial CBC showed WBC at 11.8 with 82% neutrophils.  CT head scan was negative for acute findings but multiple remote lacunar infarcts. In the ED, her initial vitals showed pressure of 99.2, pulse of 152.  Patient was started on insulin drip Cardizem was given and patient received IV fluid bolus.  Patient was then admitted hospital for further evaluation and treatment.  Assessment/Plan:  Principal Problem:   DKA (diabetic ketoacidosis) ([place]) Active Problems:   Tachycardia   Hyperthyroidism   Essential hypertension   CAD (coronary artery disease)   HLD (hyperlipidemia)   CVA (cerebral vascular accident) ([place])   Diabetic Ketoacidosis Has improved at this time after insulin drip.  Continue sliding scale insulin, long-acting insulin.  Continue IV fluids for now.  Having issues with vomiting.  Continued poor tolerance, nausea vomiting.  Patient states that Phenergan helps her.  Was on Reglan at home which has been resumed.  Continue Zofran.  Patient likely has diabetic gastroparesis causing this.     Hyperthyroidism Resumed methimazole,  Metoprolol IV every 6hrly.  Resume oral metoprolol when PO ok     Essential hypertension Metoprolol 5 mg IVP every 6 hours.  Oral metoprolol when able to tolerate.    CAD (coronary artery disease) antiplatelets beta-blocker and statin as tolerated p.o.    HLD  (hyperlipidemia) Hold Lipitor for now.     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  Continue supportive care. Continue supportive care.  Speech therapy on board and is on full liquids at this time.  Disposition:  Patient's daughter feels frustrated that the patient did not get any medications including insulin at the current nursing facility and wishes her mom to be discharged to a new skilled nursing facility.  TOC has been consulted regarding this.  Awaiting for new skilled nursing facility placement.  DVT prophylaxis: SCDs Start: 02/19/21 1527   Code Status: Full code  Family Communication:  I spoke with the patient's daughter on the phone on 02/20/2021   status is: Inpatient  The patient is inpatient because: DKA, need for placement.  Consultants: None  Procedures: None  Anti-infectives:  None  Anti-infectives (From admission, onward)    None      Subjective: Today, patient was seen and examined at bedside.  Continues to have issues with vomiting.  Wishes to try some food this morning.   Vitals:   02/21/21 0600 02/21/21 0700  BP: 118/77 (!) 159/127  Pulse: (!) 117 (!) 105  Resp: 17 15  Temp:    SpO2: 95% 96%    Intake/Output Summary (Last 24 hours) at 02/21/2021 0730 Last data filed at 02/21/2021 0400 Gross per 24 hour  Intake 150 ml  Output 1250 ml  Net -1100 ml    Filed Weights   02/19/21 1039  Weight: 89.8 kg   Body mass index is 36.21 kg/m.   Physical Exam: General: Obese built, not in obvious distress, dysarthric, HENT:  No scleral pallor or icterus noted. Oral mucosa is moist.  Chest:  Clear breath sounds.  Diminished breath sounds bilaterally. No crackles or wheezes.  CVS: S1 &S2 heard. No murmur.  Regular rate and rhythm. Abdomen: Soft, nontender, nondistended.  Bowel sounds are heard.   Extremities: No cyanosis, clubbing or edema.  Peripheral pulses are palpable. Psych:  Alert, awake and communicative, dysarthric, normal mood CNS: Dysarthric, contracture of the upper extremity with bilateral lower extremity weakness, Skin: Warm and dry.  No rashes noted.   Data Review: I have personally reviewed the following laboratory data and studies,  CBC: Recent Labs  Lab 02/14/21 1114 02/19/21 1044 02/21/21 0407  WBC 11.8* 11.8* 11.1*  NEUTROABS 8.7* 9.6*  --   HGB 12.8 13.1 11.9*  HCT 37.5 39.7 36.8  MCV 80.5 82.4 83.6  PLT 420* 581* 547*    Basic Metabolic Panel: Recent Labs  Lab 02/19/21 1044 02/19/21 1606 02/19/21 2130 02/20/21 0511 02/21/21 0407  NA 136 139 138 137 134*  K 3.7 3.9 3.3* 4.1 3.5  CL 94* 100 102 102 99  CO2 26 30 29 26 27   GLUCOSE 383* 163* 133* 174* 215*  BUN 8 7 6  5* 7  CREATININE 0.86 0.70 0.60 0.55 0.59  CALCIUM 9.1 8.8* 8.4* 8.5* 8.6*  MG  --  2.6*  --   --  1.7  PHOS  --   --   --   --  4.3    Liver Function Tests: Recent Labs  Lab 02/14/21 1114 02/19/21 1044 02/21/21 0407  AST 13* 15 12*  ALT 16 23 18   ALKPHOS 94 94 72  BILITOT 0.9 0.9 0.6  PROT 7.8 8.4* 7.0  ALBUMIN 3.3* 3.9 3.3*    Recent Labs  Lab 02/19/21 1044  LIPASE 27    No results for input(s): AMMONIA in the last 168 hours. Cardiac Enzymes: No results for input(s): CKTOTAL, CKMB, CKMBINDEX, TROPONINI in the last 168 hours. BNP (last 3 results) No results for input(s): BNP in the last 8760 hours.  ProBNP (last 3 results) No results for input(s): PROBNP in the last 8760 hours.  CBG: Recent Labs  Lab 02/20/21 0131 02/20/21 0750 02/20/21 1219 02/20/21 1706 02/20/21 2127  GLUCAP 169* 172* 296* 269* 149*    Recent Results (from the past 240 hour(s))  Resp Panel by RT-PCR (Flu A&B, Covid) Nasopharyngeal Swab     Status: None   Collection Time: 02/11/21  1:17 PM   Specimen: Nasopharyngeal Swab; Nasopharyngeal(NP) swabs in vial transport medium  Result Value Ref Range Status   SARS Coronavirus 2 by RT PCR NEGATIVE NEGATIVE Final     Comment: (NOTE) SARS-CoV-2 target nucleic acids are NOT DETECTED.  The SARS-CoV-2 RNA is generally detectable in upper respiratory specimens during the acute phase of infection. The lowest concentration of SARS-CoV-2 viral copies this assay can detect is 138 copies/mL. A negative result does not preclude SARS-Cov-2 infection and should not be used as the sole basis for treatment or other patient management decisions. A negative result may occur with  improper specimen collection/handling, submission of specimen other than nasopharyngeal swab, presence of viral mutation(s) within the areas targeted by this assay, and inadequate number of viral copies(<138 copies/mL). A negative result must be combined with clinical observations, patient history, and epidemiological information. The expected result is Negative.  Fact Sheet for Patients:  EntrepreneurPulse.com.au  Fact Sheet for Healthcare Providers:  IncredibleEmployment.be  This test is no t yet approved or cleared by the [place]  States FDA and  has been authorized for detection and/or diagnosis of SARS-CoV-2 by FDA under an Emergency Use Authorization (EUA). This EUA will remain  in effect (meaning this test can be used) for the duration of the COVID-19 declaration under Section 564(b)(1) of the Act, 21 U.S.C.section 360bbb-3(b)(1), unless the authorization is terminated  or revoked sooner.       Influenza A by PCR NEGATIVE NEGATIVE Final   Influenza B by PCR NEGATIVE NEGATIVE Final    Comment: (NOTE) The Xpert Xpress SARS-CoV-2/FLU/RSV plus assay is intended as an aid in the diagnosis of influenza from Nasopharyngeal swab specimens and should not be used as a sole basis for treatment. Nasal washings and aspirates are unacceptable for Xpert Xpress SARS-CoV-2/FLU/RSV testing.  Fact Sheet for Patients: EntrepreneurPulse.com.au  Fact Sheet for Healthcare  Providers: IncredibleEmployment.be  This test is not yet approved or cleared by the [place] FDA and has been authorized for detection and/or diagnosis of SARS-CoV-2 by FDA under an Emergency Use Authorization (EUA). This EUA will remain in effect (meaning this test can be used) for the duration of the COVID-19 declaration under Section 564(b)(1) of the Act, 21 U.S.C. section 360bbb-3(b)(1), unless the authorization is terminated or revoked.  Performed at [facility], [place] [address], [place], [place] [address]   Resp Panel by RT-PCR (Flu A&B, Covid) Nasopharyngeal Swab     Status: None   Collection Time: 02/19/21  2:39 PM   Specimen: Nasopharyngeal Swab; Nasopharyngeal(NP) swabs in vial transport medium  Result Value Ref Range Status   SARS Coronavirus 2 by RT PCR NEGATIVE NEGATIVE Final    Comment: (NOTE) SARS-CoV-2 target nucleic acids are NOT DETECTED.  The SARS-CoV-2 RNA is generally detectable in upper respiratory specimens during the acute phase of infection. The lowest concentration of SARS-CoV-2 viral copies this assay can detect is 138 copies/mL. A negative result does not preclude SARS-Cov-2 infection and should not be used as the sole basis for treatment or other patient management decisions. A negative result may occur with  improper specimen collection/handling, submission of specimen other than nasopharyngeal swab, presence of viral mutation(s) within the areas targeted by this assay, and inadequate number of viral copies(<138 copies/mL). A negative result must be combined with clinical observations, patient history, and epidemiological information. The expected result is Negative.  Fact Sheet for Patients:  EntrepreneurPulse.com.au  Fact Sheet for Healthcare Providers:  IncredibleEmployment.be  This test is no t yet approved or cleared by the [place] FDA and  has been  authorized for detection and/or diagnosis of SARS-CoV-2 by FDA under an Emergency Use Authorization (EUA). This EUA will remain  in effect (meaning this test can be used) for the duration of the COVID-19 declaration under Section 564(b)(1) of the Act, 21 U.S.C.section 360bbb-3(b)(1), unless the authorization is terminated  or revoked sooner.       Influenza A by PCR NEGATIVE NEGATIVE Final   Influenza B by PCR NEGATIVE NEGATIVE Final    Comment: (NOTE) The Xpert Xpress SARS-CoV-2/FLU/RSV plus assay is intended as an aid in the diagnosis of influenza from Nasopharyngeal swab specimens and should not be used as a sole basis for treatment. Nasal washings and aspirates are unacceptable for Xpert Xpress SARS-CoV-2/FLU/RSV testing.  Fact Sheet for Patients: EntrepreneurPulse.com.au  Fact Sheet for Healthcare Providers: IncredibleEmployment.be  This test is not yet approved or cleared by the [place] FDA and has been authorized for detection and/or diagnosis of SARS-CoV-2 by FDA under an Emergency Use Authorization (EUA).  This EUA will remain in effect (meaning this test can be used) for the duration of the COVID-19 declaration under Section 564(b)(1) of the Act, 21 U.S.C. section 360bbb-3(b)(1), unless the authorization is terminated or revoked.  Performed at [facility], [place] [address], [place], [place] [address]   MRSA Next Gen by PCR, Nasal     Status: None   Collection Time: 02/19/21  4:52 PM   Specimen: Nasal Mucosa; Nasal Swab  Result Value Ref Range Status   MRSA by PCR Next Gen NOT DETECTED NOT DETECTED Final    Comment: (NOTE) The GeneXpert MRSA Assay (FDA approved for NASAL specimens only), is one component of a comprehensive MRSA colonization surveillance program. It is not intended to diagnose MRSA infection nor to guide or monitor treatment for MRSA infections. Test performance is not FDA approved in  patients less than 28 years old. Performed at [facility], [place] [address] [place], [place] [address]       Studies: CT Head Wo Contrast  Result Date: 02/19/2021 CLINICAL DATA:  Facial trauma EXAM: CT HEAD WITHOUT CONTRAST CT MAXILLOFACIAL WITHOUT CONTRAST TECHNIQUE: Multidetector CT imaging of the head and maxillofacial structures were performed using the standard protocol without intravenous contrast. Multiplanar CT image reconstructions of the maxillofacial structures were also generated. RADIATION DOSE REDUCTION: This exam was performed according to the departmental dose-optimization program which includes automated exposure control, adjustment of the mA and/or kV according to patient size and/or use of iterative reconstruction technique. COMPARISON:  CT head 01/24/2021, MR head 01/20/2021 FINDINGS: CT HEAD FINDINGS Brain: There is no evidence of acute intracranial hemorrhage, extra-axial fluid collection, or acute infarct. A remote infarct in the left cerebellar hemisphere is unchanged. A remote cortical infarct in the left frontal lobe is unchanged. Multiple additional remote lacunar infarcts in the bilateral basal ganglia are also again seen. The background of parenchymal volume loss and chronic white matter microangiopathy is not significantly changed. There is no mass lesion. There is no midline shift. Vascular: There is calcification of the bilateral cavernous ICAs and vertebral arteries. Skull: Normal. Negative for fracture or focal lesion. Other: None. CT MAXILLOFACIAL FINDINGS Osseous: No fracture or mandibular dislocation. No destructive process. Orbits: The globes and orbits are unremarkable. Sinuses: The paranasal sinuses are clear. The mastoid air cells are clear. Soft tissues: Unremarkable. IMPRESSION: 1. No acute intracranial pathology. 2. No acute facial bone fracture. 3. Multiple remote infarcts as above, unchanged. Electronically Signed   By: Valetta Mole M.D.    On: 02/19/2021 13:25   [place] EKG SITE RITE  Result Date: 02/19/2021 If Site Rite image not attached, placement could not be confirmed due to current cardiac rhythm.  CT Maxillofacial Wo Contrast  Result Date: 02/19/2021 CLINICAL DATA:  Facial trauma EXAM: CT HEAD WITHOUT CONTRAST CT MAXILLOFACIAL WITHOUT CONTRAST TECHNIQUE: Multidetector CT imaging of the head and maxillofacial structures were performed using the standard protocol without intravenous contrast. Multiplanar CT image reconstructions of the maxillofacial structures were also generated. RADIATION DOSE REDUCTION: This exam was performed according to the departmental dose-optimization program which includes automated exposure control, adjustment of the mA and/or kV according to patient size and/or use of iterative reconstruction technique. COMPARISON:  CT head 01/24/2021, MR head 01/20/2021 FINDINGS: CT HEAD FINDINGS Brain: There is no evidence of acute intracranial hemorrhage, extra-axial fluid collection, or acute infarct. A remote infarct in the left cerebellar hemisphere is unchanged. A remote cortical infarct in the left frontal lobe is unchanged. Multiple additional remote  lacunar infarcts in the bilateral basal ganglia are also again seen. The background of parenchymal volume loss and chronic white matter microangiopathy is not significantly changed. There is no mass lesion. There is no midline shift. Vascular: There is calcification of the bilateral cavernous ICAs and vertebral arteries. Skull: Normal. Negative for fracture or focal lesion. Other: None. CT MAXILLOFACIAL FINDINGS Osseous: No fracture or mandibular dislocation. No destructive process. Orbits: The globes and orbits are unremarkable. Sinuses: The paranasal sinuses are clear. The mastoid air cells are clear. Soft tissues: Unremarkable. IMPRESSION: 1. No acute intracranial pathology. 2. No acute facial bone fracture. 3. Multiple remote infarcts as above, unchanged. Electronically  Signed   By: Valetta Mole M.D.   On: 02/19/2021 13:25      Flora Lipps, MD  [facility] 02/21/2021  If 7PM-7AM, please contact night-coverage

## 2021-02-21 NOTE — Progress Notes (Signed)
Speech Language Pathology Treatment:    Patient Details Name: Lori [name] MRN: [id number] DOB: February 13, 1970 Today's Date: 02/21/2021 Time: 0626-9485 SLP Time Calculation (min) (ACUTE ONLY): 24 min  Assessment / Plan / Recommendation Clinical Impression  Pt seen to address dysphagia goals.  Pt today with less labial edema, improved articulation and reports no vomiting this am.   Took picture of pt on her phone for self reference re: physical changes/facial.  She is encouraged re: nausea/vomiting but wants liquids ONLY - no solid foods - due to apprehension from N/V episodes.  Reglan started last pm - and pt reports this has been helpful.    She was willing to consume ice cream today - with no indication of aspiration or dysphagia.  Dental sensitivity significant - thus SLP will provide her with sensodyne toothpaste this week.  Pt declined to consume Gingerale due to odynophagia - likely from vomiting.    Anticipate pt will rapidly progress with po tolerance- was on dys3/thin at SNF.  Will follow up once more to aid in dysphagia goals - especially given pt's h/o silent aspiration of thin.  Pt informed and agreeable to plan using teach back.   HPI HPI: Pt is a 51 yo female adm to [facility] from SNF with DKA, tachycardia and hypertensive urgency.  Per pt, she did not receive her medications while at SNF over the weekend.  Swallow eval ordered as per RN, pt coughing with liquid intake.   Upon admit, CXR negative, pt passed 3 ounce Yale water challenge.  CT head negative for acute event.  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 She was recently in the hospital for aphasia, nausea and vomiting.  Pt reports to this SLP that she has h/o 22 strokes, first being in 2014.  Diet at SNF  is regular/thin.  During prior admit, pt with soft tissue neck edema concerning for infection- but imaging study was negative. Pt underwent MBS during last admit - with recommendation for dys2/nectar diet - per SLP from 12/22 visit, pt advised she would not use thickened liquids.  Pt has been having improvement with nausea/vomiting today and she was on reglan prior to admit - MD restarted reglan on 02/20/21 pm.      SLP Plan  Continue with current plan of care      Recommendations for follow up therapy are one component of a multi-disciplinary discharge planning process, led by the attending physician.  Recommendations may be updated based on patient status, additional functional criteria and insurance authorization.    Recommendations  Diet recommendations: Dysphagia 3 (mechanical soft);Thin liquid (when MD indicates) Liquids provided via: Cup;Straw Medication Administration: Whole meds with puree Supervision: Patient able to self feed;Staff to assist with self feeding;Intermittent supervision to cue for compensatory strategies Compensations: Minimize environmental distractions;Slow rate;Small sips/bites;Lingual sweep for clearance of pocketing;Multiple dry swallows after each bite/sip;Follow solids with liquid;Clear throat intermittently Postural Changes and/or Swallow Maneuvers: Out of bed for meals;Seated upright 90 degrees;Upright 30-60 min after meal                Oral Care Recommendations: Oral care BID;Oral care before and after PO;Patient independent with oral care Follow Up Recommendations: Skilled nursing-short term rehab (<3 hours/day) Assistance recommended at discharge: Frequent or constant Supervision/Assistance SLP Visit Diagnosis: Dysphagia, pharyngeal phase (R13.13);Dysphagia, oropharyngeal phase (  R13.12) Plan: Continue with current plan of care           Macario Golds. Kathleen Lime, [place] Office [phone] Cell  [phone]    02/21/2021, 11:30 AM

## 2021-02-22 DIAGNOSIS — E876 Hypokalemia: Secondary | ICD-10-CM

## 2021-02-22 LAB — COMPREHENSIVE METABOLIC PANEL
ALT: 18 U/L (ref 0–44)
AST: 15 U/L (ref 15–41)
Albumin: 3.1 g/dL — ABNORMAL LOW (ref 3.5–5.0)
Alkaline Phosphatase: 67 U/L (ref 38–126)
Anion gap: 8 (ref 5–15)
BUN: 16 mg/dL (ref 6–20)
CO2: 28 mmol/L (ref 22–32)
Calcium: 8.7 mg/dL — ABNORMAL LOW (ref 8.9–10.3)
Chloride: 99 mmol/L (ref 98–111)
Creatinine, Ser: 0.9 mg/dL (ref 0.44–1.00)
GFR, Estimated: >60 mL/min (ref >60)
Glucose, Bld: 125 mg/dL — ABNORMAL HIGH (ref 70–99)
Potassium: 3.4 mmol/L — ABNORMAL LOW (ref 3.5–5.1)
Sodium: 135 mmol/L (ref 135–145)
Total Bilirubin: 0.4 mg/dL (ref 0.3–1.2)
Total Protein: 6.5 g/dL (ref 6.5–8.1)

## 2021-02-22 LAB — GLUCOSE, CAPILLARY
Glucose-Capillary: 110 mg/dL — ABNORMAL HIGH (ref 70–99)
Glucose-Capillary: 234 mg/dL — ABNORMAL HIGH (ref 70–99)
Glucose-Capillary: 196 mg/dL — ABNORMAL HIGH (ref 70–99)
Glucose-Capillary: 236 mg/dL — ABNORMAL HIGH (ref 70–99)

## 2021-02-22 LAB — CBC
HCT: 34.7 % — ABNORMAL LOW (ref 36.0–46.0)
Hemoglobin: 11.4 g/dL — ABNORMAL LOW (ref 12.0–15.0)
MCH: 27.3 pg (ref 26.0–34.0)
MCHC: 32.9 g/dL (ref 30.0–36.0)
MCV: 83 fL (ref 80.0–100.0)
Platelets: 492 10*3/uL — ABNORMAL HIGH (ref 150–400)
RBC: 4.18 MIL/uL (ref 3.87–5.11)
RDW: 16.3 % — ABNORMAL HIGH (ref 11.5–15.5)
WBC: 9.1 10*3/uL (ref 4.0–10.5)
nRBC: 0 % (ref 0.0–0.2)

## 2021-02-22 LAB — MAGNESIUM: Magnesium: 2.1 mg/dL (ref 1.7–2.4)

## 2021-02-22 MED ORDER — INSULIN ASPART 100 UNIT/ML IJ SOLN
3.0000 [IU] | Freq: Three times a day (TID) | INTRAMUSCULAR | Status: DC
  Administered 2021-02-22 – 2021-02-26 (×12): 3 [IU] via SUBCUTANEOUS

## 2021-02-22 MED ORDER — POTASSIUM CHLORIDE CRYS ER 20 MEQ PO TBCR
40.0000 meq | EXTENDED_RELEASE_TABLET | Freq: Once | ORAL | Status: AC
  Administered 2021-02-22: 40 meq via ORAL
  Filled 2021-02-22: qty 2

## 2021-02-22 NOTE — TOC Initial Note (Signed)
Transition of Care [facility]) - Initial/Assessment Note    Patient Details  Name: Lori [name] MRN: [id number] Date of Birth: [birth year]  Transition of Care [facility]) CM/SW Contact:    Dessa Phi, RN Phone Number: 02/22/2021, 12:14 PM  Clinical Narrative: spoke to dtr about d/c plans-[name] prefers to fax out to other SNF's-she declines patient returning back to [place].States patient currently able to transfer from bed to w/c with asst. PT eval-await recc.                 Expected Discharge Plan: Skilled Nursing Facility Barriers to Discharge: Continued Medical Work up   Patient Goals and CMS Choice        Expected Discharge Plan and Services Expected Discharge Plan: [place]                                              Prior Living Arrangements/Services                       Activities of Daily Living      Permission Sought/Granted                  Emotional Assessment              Admission diagnosis:  DKA (diabetic ketoacidosis) ([place]) [E11.10] Hyperglycemia [R73.9] Patient Active Problem List   Diagnosis Date Noted   DKA (diabetic ketoacidosis) ([place]) 02/19/2021   Constipation    Weakness    Type 2 diabetes mellitus with hyperlipidemia ([place])    Aphasia    Spell of abnormal behavior    Stroke ([place]) 01/19/2021   Tachycardia 01/19/2021   CAD (coronary artery disease) 01/19/2021   CVA (cerebral vascular accident) ([place]) 01/19/2021   Diabetes mellitus without complication ([place])    Hyperthyroidism    Essential hypertension    Pheochromocytoma    Depression    HLD (hyperlipidemia)    PCP:  Pcp, No Pharmacy:   [place], [place] [address] [place] [address] Phone: [phone] Fax: [phone]     Social Determinants of Health (SDOH) Interventions    Readmission Risk Interventions No flowsheet data found.

## 2021-02-22 NOTE — Progress Notes (Signed)
PROGRESS NOTE  Lori [name] DOB: 28-May-1970 DOA: 02/19/2021 PCP: Pcp, No   LOS: 2 days   Brief narrative: Lori [name] is a 51 y.o. female with past medical history significant of CAD, depression, type II DM, hypertension, pheochromocytoma, bedbound status due to history of CVA with upper extremity contracture bilateral lower extremity weakness, dysarthria presented to the hospital from [facility] with multiple episodes of vomiting.  It was informed that that she did not receive her medication including insulin at the skilled nursing facility.   Initial CBC showed WBC at 11.8 with 82% neutrophils.  CT head scan was negative for acute findings but multiple remote lacunar infarcts. In the ED, her initial vitals showed pressure of 99.2, pulse of 152.  Patient was started on insulin drip Cardizem was given and patient received IV fluid bolus.  Patient was then admitted hospital for further evaluation and treatment.  Assessment/Plan:  Principal Problem:   DKA (diabetic ketoacidosis) ([place]) Active Problems:   Tachycardia   Hyperthyroidism   Essential hypertension   CAD (coronary artery disease)   HLD (hyperlipidemia)   CVA (cerebral vascular accident) ([place])   Diabetic Ketoacidosis Has improved at this time after insulin drip.  Continue sliding scale insulin, long-acting insulin.  Add meal time insulin if able to eat better.  nausea vomiting.  No vomiting today.  Has mild nausea.  Tolerating p.o. so far.  On Phenergan Reglan and Zofran.  Continue Reglan 3 times daily from home.  Likely has diabetic gastroparesis.  Will advance diet as tolerated to soft fat-free.  Mild hypokalemia.  We will replace.  Check levels in a.m.    Hyperthyroidism Continue methimazole metoprolol     Essential hypertension Continue metoprolol    CAD (coronary artery disease) antiplatelets beta-blocker and statin as tolerated p.o.    HLD (hyperlipidemia) Hold Lipitor for now.   Continue Plavix metoprolol.  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   Speech therapy on board and is on full liquids at this time.   Disposition: Patient's daughter feels frustrated that the patient did not get any medications including insulin at the current nursing facility and wishes her mom to be discharged to a new skilled nursing facility.  TOC has been consulted regarding this.    DVT prophylaxis: SCDs Start: 02/19/21 1527  Code Status: Full code  Family Communication:  I spoke with the patient's daughter on the phone on 02/20/2021   status is: Inpatient  The patient is inpatient because: DKA, need for placement.  Consultants: None  Procedures: None  Anti-infectives:  None  Anti-infectives (From admission, onward)    None      Subjective: Today, patient was seen and examined at bedside.  Patient states that she did not have any vomiting or abdominal pain yesterday but feels little nauseated.  More alert awake and communicative.     Intake/Output Summary (Last 24 hours) at 02/22/2021 1149 Last data filed at 02/22/2021 0600 Gross per 24 hour  Intake 0 ml  Output 150 ml  Net -150 ml    Filed Weights   02/19/21 1039  Weight: 89.8 kg   Body mass index is 36.21 kg/m.   Physical Exam: General: Obese built, not in obvious distress, dysarthric HENT:   No scleral pallor or icterus noted. Oral mucosa is moist.  Chest:  Clear breath sounds.  Diminished breath sounds bilaterally. No crackles or wheezes.  CVS: S1 &S2  heard. No murmur.  Regular rate and rhythm. Abdomen: Soft, nontender, nondistended.  Bowel sounds are heard.   Extremities: No cyanosis, clubbing or edema.  Peripheral pulses are palpable. Psych: Alert, awake and communicative of bilateral lower extremity weakness  CNS: Dysarthric, bilateral lower extremity weakness,  skin: Warm and dry.  No rashes noted.  Data Review: I have  personally reviewed the following laboratory data and studies,  CBC: Recent Labs  Lab 02/19/21 1044 02/21/21 0407 02/22/21 0306  WBC 11.8* 11.1* 9.1  NEUTROABS 9.6*  --   --   HGB 13.1 11.9* 11.4*  HCT 39.7 36.8 34.7*  MCV 82.4 83.6 83.0  PLT 581* 547* 492*    Basic Metabolic Panel: Recent Labs  Lab 02/19/21 1606 02/19/21 2130 02/20/21 0511 02/21/21 0407 02/22/21 0306  NA 139 138 137 134* 135  K 3.9 3.3* 4.1 3.5 3.4*  CL 100 102 102 99 99  CO2 30 29 26 27 28   GLUCOSE 163* 133* 174* 215* 125*  BUN 7 6 5* 7 16  CREATININE 0.70 0.60 0.55 0.59 0.90  CALCIUM 8.8* 8.4* 8.5* 8.6* 8.7*  MG 2.6*  --   --  1.7 2.1  PHOS  --   --   --  4.3  --     Liver Function Tests: Recent Labs  Lab 02/19/21 1044 02/21/21 0407 02/22/21 0306  AST 15 12* 15  ALT 23 18 18   ALKPHOS 94 72 67  BILITOT 0.9 0.6 0.4  PROT 8.4* 7.0 6.5  ALBUMIN 3.9 3.3* 3.1*    Recent Labs  Lab 02/19/21 1044  LIPASE 27    No results for input(s): AMMONIA in the last 168 hours. Cardiac Enzymes: No results for input(s): CKTOTAL, CKMB, CKMBINDEX, TROPONINI in the last 168 hours. BNP (last 3 results) No results for input(s): BNP in the last 8760 hours.  ProBNP (last 3 results) No results for input(s): PROBNP in the last 8760 hours.  CBG: Recent Labs  Lab 02/21/21 1134 02/21/21 1757 02/21/21 2125 02/22/21 0727 02/22/21 1140  GLUCAP 250* 207* 109* 110* 234*    Recent Results (from the past 240 hour(s))  Resp Panel by RT-PCR (Flu A&B, Covid) Nasopharyngeal Swab     Status: None   Collection Time: 02/19/21  2:39 PM   Specimen: Nasopharyngeal Swab; Nasopharyngeal(NP) swabs in vial transport medium  Result Value Ref Range Status   SARS Coronavirus 2 by RT PCR NEGATIVE NEGATIVE Final    Comment: (NOTE) SARS-CoV-2 target nucleic acids are NOT DETECTED.  The SARS-CoV-2 RNA is generally detectable in upper respiratory specimens during the acute phase of infection. The lowest concentration of  SARS-CoV-2 viral copies this assay can detect is 138 copies/mL. A negative result does not preclude SARS-Cov-2 infection and should not be used as the sole basis for treatment or other patient management decisions. A negative result may occur with  improper specimen collection/handling, submission of specimen other than nasopharyngeal swab, presence of viral mutation(s) within the areas targeted by this assay, and inadequate number of viral copies(<138 copies/mL). A negative result must be combined with clinical observations, patient history, and epidemiological information. The expected result is Negative.  Fact Sheet for Patients:  EntrepreneurPulse.com.au  Fact Sheet for Healthcare Providers:  IncredibleEmployment.be  This test is no t yet approved or cleared by the [place] FDA and  has been authorized for detection and/or diagnosis of SARS-CoV-2 by FDA under an Emergency Use Authorization (EUA). This EUA will remain  in effect (meaning this test can  be used) for the duration of the COVID-19 declaration under Section 564(b)(1) of the Act, 21 U.S.C.section 360bbb-3(b)(1), unless the authorization is terminated  or revoked sooner.       Influenza A by PCR NEGATIVE NEGATIVE Final   Influenza B by PCR NEGATIVE NEGATIVE Final    Comment: (NOTE) The Xpert Xpress SARS-CoV-2/FLU/RSV plus assay is intended as an aid in the diagnosis of influenza from Nasopharyngeal swab specimens and should not be used as a sole basis for treatment. Nasal washings and aspirates are unacceptable for Xpert Xpress SARS-CoV-2/FLU/RSV testing.  Fact Sheet for Patients: EntrepreneurPulse.com.au  Fact Sheet for Healthcare Providers: IncredibleEmployment.be  This test is not yet approved or cleared by the [place] FDA and has been authorized for detection and/or diagnosis of SARS-CoV-2 by FDA under an Emergency Use  Authorization (EUA). This EUA will remain in effect (meaning this test can be used) for the duration of the COVID-19 declaration under Section 564(b)(1) of the Act, 21 U.S.C. section 360bbb-3(b)(1), unless the authorization is terminated or revoked.  Performed at [facility], [place] [address] [place], [place] [address]   MRSA Next Gen by PCR, Nasal     Status: None   Collection Time: 02/19/21  4:52 PM   Specimen: Nasal Mucosa; Nasal Swab  Result Value Ref Range Status   MRSA by PCR Next Gen NOT DETECTED NOT DETECTED Final    Comment: (NOTE) The GeneXpert MRSA Assay (FDA approved for NASAL specimens only), is one component of a comprehensive MRSA colonization surveillance program. It is not intended to diagnose MRSA infection nor to guide or monitor treatment for MRSA infections. Test performance is not FDA approved in patients less than 49 years old. Performed at [facility], [place] [address] [place], [place] [address]       Studies: No results found.    Flora Lipps, MD  [facility] 02/22/2021  If 7PM-7AM, please contact night-coverage

## 2021-02-22 NOTE — Progress Notes (Signed)
Inpatient Diabetes Program Recommendations  AACE/ADA: New Consensus Statement on Inpatient Glycemic Control (2015)  Target Ranges:  Prepandial:   less than 140 mg/dL      Peak postprandial:   less than 180 mg/dL (1-2 hours)      Critically ill patients:  140 - 180 mg/dL   Lab Results  Component Value Date   GLUCAP 110 (H) 02/22/2021   HGBA1C 9.0 (H) 02/19/2021    Review of Glycemic Control  Diabetes history: DM2 Outpatient Diabetes medications: Lantus 25 units QD Current orders for Inpatient glycemic control: Semglee 10 QD, Novolog 0-15 TID with meals and 0-5 HS  HgbA1C - 9% On FL diet 125, 110 mg/dL this am. Good glycemic control. Has not advanced diet.  Inpatient Diabetes Program Recommendations:    Agree with orders. If diet advanced to CHO mod med and pt is eating > 50%, will likely need meal coverage insulin -  Novolog 3 units TID   Continue to monitor glycemic control and trends.  Thank you. Lorenda Peck, RD, LDN, CDE Inpatient Diabetes Coordinator [phone]

## 2021-02-23 LAB — BASIC METABOLIC PANEL
Anion gap: 6 (ref 5–15)
BUN: 14 mg/dL (ref 6–20)
CO2: 27 mmol/L (ref 22–32)
Calcium: 8.2 mg/dL — ABNORMAL LOW (ref 8.9–10.3)
Chloride: 99 mmol/L (ref 98–111)
Creatinine, Ser: 0.65 mg/dL (ref 0.44–1.00)
GFR, Estimated: >60 mL/min (ref >60)
Glucose, Bld: 158 mg/dL — ABNORMAL HIGH (ref 70–99)
Potassium: 4 mmol/L (ref 3.5–5.1)
Sodium: 132 mmol/L — ABNORMAL LOW (ref 135–145)

## 2021-02-23 LAB — GLUCOSE, CAPILLARY
Glucose-Capillary: 160 mg/dL — ABNORMAL HIGH (ref 70–99)
Glucose-Capillary: 149 mg/dL — ABNORMAL HIGH (ref 70–99)
Glucose-Capillary: 169 mg/dL — ABNORMAL HIGH (ref 70–99)

## 2021-02-23 LAB — MAGNESIUM: Magnesium: 2 mg/dL (ref 1.7–2.4)

## 2021-02-23 LAB — CBC
HCT: 33.1 % — ABNORMAL LOW (ref 36.0–46.0)
Hemoglobin: 10.7 g/dL — ABNORMAL LOW (ref 12.0–15.0)
MCH: 26.9 pg (ref 26.0–34.0)
MCHC: 32.3 g/dL (ref 30.0–36.0)
MCV: 83.2 fL (ref 80.0–100.0)
Platelets: 459 10*3/uL — ABNORMAL HIGH (ref 150–400)
RBC: 3.98 MIL/uL (ref 3.87–5.11)
RDW: 15.8 % — ABNORMAL HIGH (ref 11.5–15.5)
WBC: 8.3 10*3/uL (ref 4.0–10.5)
nRBC: 0 % (ref 0.0–0.2)

## 2021-02-23 MED ORDER — PANTOPRAZOLE SODIUM 40 MG PO TBEC
40.0000 mg | DELAYED_RELEASE_TABLET | Freq: Every day | ORAL | Status: DC
  Administered 2021-02-23 – 2021-02-26 (×4): 40 mg via ORAL
  Filled 2021-02-23 (×4): qty 1

## 2021-02-23 NOTE — Progress Notes (Signed)
? ?  Inpatient Rehab Admissions Coordinator : ? ?Per therapy recommendations, patient was screened for CIR candidacy by Remmi Armenteros RN MSN.  At this time patient appears to be a potential candidate for CIR. I will place a rehab consult per protocol for full assessment. Please call me with any questions. ? ?Johanna Matto RN MSN ?Admissions Coordinator ?[phone] ?  ?

## 2021-02-23 NOTE — Progress Notes (Signed)
Report given to Aragon. All questions were answered. Patient updated daughter.

## 2021-02-23 NOTE — Evaluation (Signed)
Physical Therapy Evaluation Patient Details Name: Lori [name] MRN: [id number] DOB: 10-11-1970 Today's Date: 02/23/2021  History of Present Illness  51yo female who presented on 1/16 from [place], found to have DKA and was tachycardic possibly due to beta blocker withdrawal. PMH CAD s/p CABG, depression, DM, HTN, CVA with hemiplegia and dysarthria, HLD  Clinical Impression  Received in bed eating lunch very excited to see PT- tells me she does not want to go to another SNF, really wants to go to a formal rehab unit and learn how to walk again. Needed up to Mod-MaxAx1 for bed mobility and functional transfer to recliner, will definitely need +2 assist to progress to pre-gait and formal gait training. Does seem to have general reduced awareness of deficits, but very motivated. Left up in recliner with chair alarm active and RN aware of pt status/transfer method. May benefit from ongoing skilled PT services in AIR setting moving forward.        Recommendations for follow up therapy are one component of a multi-disciplinary discharge planning process, led by the attending physician.  Recommendations may be updated based on patient status, additional functional criteria and insurance authorization.  Follow Up Recommendations Acute inpatient rehab (3hours/day)    Assistance Recommended at Discharge Frequent or constant Supervision/Assistance  Patient can return home with the following  A lot of help with bathing/dressing/bathroom;Direct supervision/assist for medications management;A lot of help with walking and/or transfers;Direct supervision/assist for financial management;Assistance with cooking/housework;Assist for transportation;Help with stairs or ramp for entrance    Equipment Recommendations None recommended by PT  Recommendations for Other Services       Functional Status Assessment Patient has had a recent decline in their functional status and demonstrates the ability to  make significant improvements in function in a reasonable and predictable amount of time.     Precautions / Restrictions Precautions Precautions: Fall Precaution Comments: L hemi (UE>LE), PICC R UE Restrictions Weight Bearing Restrictions: No      Mobility  Bed Mobility Overal bed mobility: Needs Assistance Bed Mobility: Supine to Sit     Supine to sit: Max assist, HOB elevated     General bed mobility comments: able to initiate but needed MaxA to pivot hips around to being square at EOB    Transfers Overall transfer level: Needs assistance Equipment used: 1 person hand held assist Transfers: Sit to/from Stand, Bed to chair/wheelchair/BSC Sit to Stand: Mod assist Stand pivot transfers: Mod assist         General transfer comment: on first attempt needed MaxA to stand, able to power up with [place] on second attempt and pivoted hips over to reclier with modA for balance/VC for sequencing and safety    Ambulation/Gait               General Gait Details: unable with +1 assist  Stairs            Wheelchair Mobility    Modified Rankin (Stroke Patients Only)       Balance Overall balance assessment: Needs assistance Sitting-balance support: Feet supported, Single extremity supported Sitting balance-Leahy Scale: Fair Sitting balance - Comments: patient was able to self correct sitting balance with min guard. Postural control: Posterior lean Standing balance support: During functional activity, Single extremity supported Standing balance-Leahy Scale: Poor Standing balance comment: consistent UE support  Pertinent Vitals/Pain Pain Assessment Pain Assessment: Faces Pain Score: 0-No pain Pain Intervention(s): Limited activity within patient's tolerance, Monitored during session    Home Living Family/patient expects to be discharged to:: Skilled nursing facility Living Arrangements: Children Available Help at  Discharge: Family;Available 24 hours/day Type of Home: Apartment Home Access: Stairs to enter   Entrance Stairs-Number of Steps: Pt lives on 3rd floor apartment with multiple flights to access.   Home Layout: One level Home Equipment: Rollator (4 wheels);Hand held shower head      Prior Function Prior Level of Function : Needs assist       Physical Assist : Mobility (physical);ADLs (physical) Mobility (physical): Bed mobility;Transfers;Gait ADLs (physical): Bathing;Dressing;Toileting;IADLs         Hand Dominance   Dominant Hand: Right    Extremity/Trunk Assessment   Upper Extremity Assessment Upper Extremity Assessment: Defer to OT evaluation    Lower Extremity Assessment Lower Extremity Assessment: Generalized weakness RLE Deficits / Details: AROM WNL, strength 4/5 throughout LLE Deficits / Details: ankle AROM WNL, knee extension 3+/5, hip flexion 3+/5, hip abduction and adduction 3+/5    Cervical / Trunk Assessment Cervical / Trunk Assessment: Kyphotic  Communication   Communication: Expressive difficulties  Cognition Arousal/Alertness: Awake/alert Behavior During Therapy: WFL for tasks assessed/performed, Flat affect Overall Cognitive Status: Within Functional Limits for tasks assessed                                 General Comments: patient is motivated during session, really wants to be able to walk again        General Comments      Exercises     Assessment/Plan    PT Assessment Patient needs continued PT services  PT Problem List Decreased strength;Decreased mobility;Decreased range of motion;Decreased activity tolerance;Decreased balance;Decreased knowledge of use of DME;Decreased coordination       PT Treatment Interventions DME instruction;Gait training;Functional mobility training;Therapeutic activities;Therapeutic exercise;Balance training;Neuromuscular re-education;Patient/family education    PT Goals (Current goals can be  found in the Care Plan section)  Acute Rehab PT Goals Patient Stated Goal: go to rehab unit and be able to walk again PT Goal Formulation: With patient Time For Goal Achievement: 03/09/21 Potential to Achieve Goals: Fair    Frequency Min 4X/week     Co-evaluation               AM-PAC PT "6 Clicks" Mobility  Outcome Measure Help needed turning from your back to your side while in a flat bed without using bedrails?: A Lot Help needed moving from lying on your back to sitting on the side of a flat bed without using bedrails?: A Lot Help needed moving to and from a bed to a chair (including a wheelchair)?: A Lot Help needed standing up from a chair using your arms (e.g., wheelchair or bedside chair)?: A Lot Help needed to walk in hospital room?: Total Help needed climbing 3-5 steps with a railing? : Total 6 Click Score: 10    End of Session Equipment Utilized During Treatment: Gait belt Activity Tolerance: Patient tolerated treatment well Patient left: in chair;with call bell/phone within reach;with chair alarm set Nurse Communication: Mobility status;Need for lift equipment (stedy) PT Visit Diagnosis: Unsteadiness on feet (R26.81);Muscle weakness (generalized) (M62.81);Hemiplegia and hemiparesis;Other abnormalities of gait and mobility (R26.89) Hemiplegia - Right/Left: Left Hemiplegia - dominant/non-dominant: Non-dominant Hemiplegia - caused by: Cerebral infarction    Time: 2426-8341 PT Time Calculation (  min) (ACUTE ONLY): 24 min   Charges:   PT Evaluation $PT Eval Moderate Complexity: 1 Mod PT Treatments $Therapeutic Activity: 8-22 mins       Windell Norfolk, DPT, PN2   Supplemental Physical Therapist [place]    Pager [phone] Acute Rehab Office [phone]

## 2021-02-23 NOTE — Progress Notes (Signed)
PROGRESS NOTE    Lori [name]  MEQ:[id number] DOB: [birth year] DOA: 02/19/2021 PCP: Pcp, No    Brief Narrative:  Lori [name] was admitted to the hospital with the working diagnosis of diabetic ketoacidosis.   51 yo female with the past medical history of T2DM, CAD, HTN, CVA,  non ambulatory and pheochromocytoma who presented with vomiting. Recent hospitalization 02/06/21 to 02/15/2021 due to sepsis due to urinary tract infection. She was discharged to SNF. Because of persistent vomiting, she was transferred back to the hospital. On her initial physical examination her blood pressure was 162/102, HR 152, RR 16, temp 99,2 and oxygen saturation 97% on room air. She had dry mucous membranes, lungs with decreased breath sounds, heart with S1 and S2 present and tachycardic, abdomen soft and non tender, no lower extremity edema. Positive dysarthria and contracture upper extremities and weakness lower extremities.   Na 136, K 3,7, CL 94, bicarbonate 26, glucose 383, BUN 8 and cr 0,86 Anion gap 16 Wbc 11.8, hgb 13,1. Hct 39,7 and plt 581 SARS COVID 19 negative   Urina analysis with sg 1,017, > 50 wbc, 21-50 RBC   Head CT with no acute changes.  EKG 153 bpm, normal axis, normal qtc, SVT rhythm with positive PVC, no significant ST segment or T wave changes.  Patient was placed on IV insulin and IV fluids with good toleration.  Diet has advanced with good toleration and transitioned to sq insulin.   Pending transfer to CIR   Assessment & Plan:   Principal Problem:   DKA (diabetic ketoacidosis) ([place]) Active Problems:   Tachycardia   Hyperthyroidism   Essential hypertension   CAD (coronary artery disease)   HLD (hyperlipidemia)   CVA (cerebral vascular accident) ([place])   Diabetes ketoacidosis, T2DM/ dyslipidemia. Patient is tolerating po well, no nausea or vomiting. Her fasting glucose this am is 158 and anion gap is 6.  Plan to continue insulin therapy with basal, pre-meal and  sliding scale for glucose cover and monitoring.  Continue with statin therapy  Patient has a PICC line on her right upper extremity  Advance diet to carb modified regular  Diabetic neuropathy, continue with gabapentin.   2. Hypokalemia/ hyponatremia. Renal function stable with serum cr at 0,65, K is 4,0 and serum bicarbonate at 27. Na is 132. Patient is tolerating po well.  Discontinue IV fluids and follow up renal function and electrolytes in am.   3. HTN/ CAD Continue blood pressure monitoring. On amlodipine , metoprolol and lisinopril for blood pressure control.   4. CVA, with functional quadriparesis  Poor mobility. Possible transfer to CIR, continue to follow inpatient rehab recommendations. Continue with clopidogrel.   5. Hyperthyroid continue with methimazole   6. Obesity class 2/ depression. Calculated BMI 36,2 Continue with escitalopram.   7. Anemia of chronic disease. Hb is 10,7 and hct at 33,1, reactive thrombocytosis at 459   Status is: Inpatient  Remains inpatient appropriate because: pending transfer to CIR    DVT prophylaxis: Enoxaparin   Code Status:    full  Family Communication:   No family at the bedside      Subjective: Patient with no further nausea or vomiting, no chest pain or dyspnea, continue to be very weak and deconditioned   Objective: Vitals:   02/23/21 0515 02/23/21 0917 02/23/21 1131 02/23/21 1513  BP: 124/74 (!) 129/91 130/83 124/83  Pulse: 82 86 75 86  Resp: 13 20 14 15   Temp: 99.1 F (37.3 C) 98.3 F (36.8  C) 98.1 F (36.7 C) 97.8 F (36.6 C)  TempSrc: Oral Oral Oral Oral  SpO2: 98% 97% 96% 99%  Weight:      Height:        Intake/Output Summary (Last 24 hours) at 02/23/2021 1528 Last data filed at 02/23/2021 0830 Gross per 24 hour  Intake 490 ml  Output 700 ml  Net -210 ml   Filed Weights   02/19/21 1039  Weight: 89.8 kg    Examination:   General:  deconditioned but not in pain or dyspnea  Neurology: Awake and  alert, non focal  E ENT: no pallor, no icterus, oral mucosa  Cardiovascular: heart with S1 and S2 present and rhythmic . Trace lower extremity edema,  Pulmonary: lungs clear to auscultation with no wheezing  Gastrointestinal. Protuberant abdomen but soft and non tender Skin. No rashes Musculoskeletal: no joint deformities     Data Reviewed: I have personally reviewed following labs and imaging studies  CBC: Recent Labs  Lab 02/19/21 1044 02/21/21 0407 02/22/21 0306 02/23/21 0431  WBC 11.8* 11.1* 9.1 8.3  NEUTROABS 9.6*  --   --   --   HGB 13.1 11.9* 11.4* 10.7*  HCT 39.7 36.8 34.7* 33.1*  MCV 82.4 83.6 83.0 83.2  PLT 581* 547* 492* 062*   Basic Metabolic Panel: Recent Labs  Lab 02/19/21 1606 02/19/21 2130 02/20/21 0511 02/21/21 0407 02/22/21 0306 02/23/21 0431  NA 139 138 137 134* 135 132*  K 3.9 3.3* 4.1 3.5 3.4* 4.0  CL 100 102 102 99 99 99  CO2 30 29 26 27 28 27   GLUCOSE 163* 133* 174* 215* 125* 158*  BUN 7 6 5* 7 16 14   CREATININE 0.70 0.60 0.55 0.59 0.90 0.65  CALCIUM 8.8* 8.4* 8.5* 8.6* 8.7* 8.2*  MG 2.6*  --   --  1.7 2.1 2.0  PHOS  --   --   --  4.3  --   --    GFR: Estimated Creatinine Clearance: 89.6 mL/min (by C-G formula based on SCr of 0.65 mg/dL). Liver Function Tests: Recent Labs  Lab 02/19/21 1044 02/21/21 0407 02/22/21 0306  AST 15 12* 15  ALT 23 18 18   ALKPHOS 94 72 67  BILITOT 0.9 0.6 0.4  PROT 8.4* 7.0 6.5  ALBUMIN 3.9 3.3* 3.1*   Recent Labs  Lab 02/19/21 1044  LIPASE 27   No results for input(s): AMMONIA in the last 168 hours. Coagulation Profile: No results for input(s): INR, PROTIME in the last 168 hours. Cardiac Enzymes: No results for input(s): CKTOTAL, CKMB, CKMBINDEX, TROPONINI in the last 168 hours. BNP (last 3 results) No results for input(s): PROBNP in the last 8760 hours. HbA1C: No results for input(s): HGBA1C in the last 72 hours. CBG: Recent Labs  Lab 02/22/21 0727 02/22/21 1140 02/22/21 1647  02/22/21 2234 02/23/21 0730  GLUCAP 110* 234* 196* 236* 160*   Lipid Profile: No results for input(s): CHOL, HDL, LDLCALC, TRIG, CHOLHDL, LDLDIRECT in the last 72 hours. Thyroid Function Tests: No results for input(s): TSH, T4TOTAL, FREET4, T3FREE, THYROIDAB in the last 72 hours. Anemia Panel: No results for input(s): VITAMINB12, FOLATE, FERRITIN, TIBC, IRON, RETICCTPCT in the last 72 hours.    Radiology Studies: I have reviewed all of the imaging during this hospital visit personally     Scheduled Meds:  amLODipine  5 mg Oral Daily   Chlorhexidine Gluconate Cloth  6 each Topical Daily   clopidogrel  75 mg Oral Daily   escitalopram  20 mg Oral Daily   feeding supplement (GLUCERNA SHAKE)  237 mL Oral TID BM   gabapentin  100 mg Oral TID   insulin aspart  0-15 Units Subcutaneous TID WC   insulin aspart  0-5 Units Subcutaneous QHS   insulin aspart  3 Units Subcutaneous TID WC   insulin glargine-yfgn  10 Units Subcutaneous Daily   lisinopril  20 mg Oral Daily   LORazepam  0.25 mg Oral QHS   mouth rinse  15 mL Mouth Rinse BID   melatonin  9 mg Oral QHS   methimazole  5 mg Oral Daily   metoCLOPramide (REGLAN) injection  10 mg Intravenous TID AC   metoprolol tartrate  100 mg Oral BID   nystatin   Topical TID   pantoprazole (PROTONIX) IV  40 mg Intravenous Daily   polyethylene glycol  17 g Oral BID   senna  1 tablet Oral QHS   sodium chloride flush  10-40 mL Intracatheter Q12H   sodium chloride flush  3 mL Intravenous Q12H   Continuous Infusions:  sodium chloride     lactated ringers 75 mL/hr at 02/23/21 1330   promethazine (PHENERGAN) injection (IM or IVPB) 25 mg (02/23/21 0537)     LOS: 3 days        Deona Novitski Gerome Apley, MD

## 2021-02-24 LAB — MAGNESIUM: Magnesium: 1.6 mg/dL — ABNORMAL LOW (ref 1.7–2.4)

## 2021-02-24 LAB — GLUCOSE, CAPILLARY
Glucose-Capillary: 195 mg/dL — ABNORMAL HIGH (ref 70–99)
Glucose-Capillary: 260 mg/dL — ABNORMAL HIGH (ref 70–99)
Glucose-Capillary: 153 mg/dL — ABNORMAL HIGH (ref 70–99)
Glucose-Capillary: 159 mg/dL — ABNORMAL HIGH (ref 70–99)

## 2021-02-24 LAB — BASIC METABOLIC PANEL
Anion gap: 7 (ref 5–15)
BUN: 9 mg/dL (ref 6–20)
CO2: 24 mmol/L (ref 22–32)
Calcium: 8.3 mg/dL — ABNORMAL LOW (ref 8.9–10.3)
Chloride: 99 mmol/L (ref 98–111)
Creatinine, Ser: 0.55 mg/dL (ref 0.44–1.00)
GFR, Estimated: >60 mL/min (ref >60)
Glucose, Bld: 255 mg/dL — ABNORMAL HIGH (ref 70–99)
Potassium: 4.5 mmol/L (ref 3.5–5.1)
Sodium: 130 mmol/L — ABNORMAL LOW (ref 135–145)

## 2021-02-24 MED ORDER — LORATADINE 10 MG PO TABS
10.0000 mg | ORAL_TABLET | Freq: Every day | ORAL | Status: DC
  Administered 2021-02-24 – 2021-02-26 (×3): 10 mg via ORAL
  Filled 2021-02-24 (×3): qty 1

## 2021-02-24 MED ORDER — MAGNESIUM SULFATE 2 GM/50ML IV SOLN
2.0000 g | Freq: Once | INTRAVENOUS | Status: AC
  Administered 2021-02-24: 2 g via INTRAVENOUS
  Filled 2021-02-24: qty 50

## 2021-02-24 NOTE — Evaluation (Signed)
Occupational Therapy Evaluation Patient Details Name: Lori [name] MRN: [id number] DOB: 1970-09-07 Today's Date: 02/24/2021   History of Present Illness 51yo female who presented on 1/16 from [place], found to have DKA and was tachycardic possibly due to beta blocker withdrawal. PMH CAD s/p CABG, depression, DM, HTN, CVA with hemiplegia and dysarthria, HLD   Clinical Impression   Patient is a 51 year old female who was recently in hospital with desire to transition to CIR. Patient reports that plan for d/c after rehab would be to transition to [facility] house with family (husband and children) support. Patient is noted to have decreased functional activity tolerance, increased tone in LUE, decreased endurance, decreased standing balance and decreased safety awareness impacting participation in ADLs. Patient would continue to benefit from skilled OT services at this time while admitted and after d/c to address noted deficits in order to improve overall safety and independence in ADLs.        Recommendations for follow up therapy are one component of a multi-disciplinary discharge planning process, led by the attending physician.  Recommendations may be updated based on patient status, additional functional criteria and insurance authorization.   Follow Up Recommendations  Acute inpatient rehab (3hours/day)    Assistance Recommended at Discharge Frequent or constant Supervision/Assistance  Patient can return home with the following A lot of help with walking and/or transfers;A lot of help with bathing/dressing/bathroom;Direct supervision/assist for medications management;Help with stairs or ramp for entrance;Direct supervision/assist for financial management;Assistance with cooking/housework    Functional Status Assessment     Equipment Recommendations  Other (comment) (defer to next venue)    Recommendations for Other Services Rehab consult     Precautions / Restrictions  Precautions Precautions: Fall Precaution Comments: L hemi (UE>LE), PICC R UE Restrictions Weight Bearing Restrictions: No      Mobility Bed Mobility Overal bed mobility: Needs Assistance Bed Mobility: Supine to Sit     Supine to sit: Max assist, HOB elevated     General bed mobility comments: able to initiate but needed MaxA to pivot hips around to being square at EOB    Transfers                          Balance Overall balance assessment: Needs assistance Sitting-balance support: Feet supported, Single extremity supported Sitting balance-Leahy Scale: Fair     Standing balance support: During functional activity, Single extremity supported Standing balance-Leahy Scale: Poor Standing balance comment: consistent UE support                           ADL either performed or assessed with clinical judgement   ADL Overall ADL's : Needs assistance/impaired Eating/Feeding: Set up;Sitting Eating/Feeding Details (indicate cue type and reason): to take small sips. Grooming: Wash/dry hands;Wash/dry face;Sitting;Minimal assistance   Upper Body Bathing: Sitting;Moderate assistance   Lower Body Bathing: Total assistance;Bed level   Upper Body Dressing : Maximal assistance;Sitting   Lower Body Dressing: Bed level;Total assistance   Toilet Transfer: Moderate assistance;Maximal assistance;Squat-pivot Toilet Transfer Details (indicate cue type and reason): simulated to bedside chair with attempts to stand pivot and some scooting as well. Toileting- Clothing Manipulation and Hygiene: Maximal assistance;Sit to/from stand;+2 for physical assistance       Functional mobility during ADLs: +2 for physical assistance;+2 for safety/equipment General ADL Comments: .     Vision Baseline Vision/History: 1 Wears glasses Ability to See in Adequate Light:  1 Impaired Patient Visual Report: No change from baseline       Perception     Praxis      Pertinent  Vitals/Pain Pain Assessment Pain Assessment: Faces Faces Pain Scale: Hurts a little bit Pain Location: abdomen Pain Descriptors / Indicators: Discomfort Pain Intervention(s): Monitored during session, Limited activity within patient's tolerance     Hand Dominance Right   Extremity/Trunk Assessment Upper Extremity Assessment Upper Extremity Assessment: RUE deficits/detail;LUE deficits/detail RUE Deficits / Details: ROM WFL, MMT grossly 4-/5 LUE Deficits / Details: patient able to extend elbow AROM with noted flexor synergy pattern noted with sneezing and anxiousness. patient noted to have pain with PROM of middle digit with h/o fx. patient noted to have contracture with inability to PROM wrist into neutral with increased tone on this date. patient no AROM and limited AAROM about 15 degrees of flexion with increased tone noted with attempt to range. LUE Coordination: decreased fine motor;decreased gross motor   Lower Extremity Assessment Lower Extremity Assessment: Defer to PT evaluation   Cervical / Trunk Assessment Cervical / Trunk Assessment: Kyphotic   Communication Communication Communication: Expressive difficulties   Cognition Arousal/Alertness: Awake/alert Behavior During Therapy: WFL for tasks assessed/performed, Flat affect Overall Cognitive Status: Within Functional Limits for tasks assessed                                 General Comments: patient is motivated during session, patient is motivated to get to CIR     General Comments       Exercises     Shoulder Instructions      Home Living Family/patient expects to be discharged to:: Inpatient rehab Living Arrangements: Children Available Help at Discharge: Family;Available 24 hours/day Type of Home: Apartment Home Access: Stairs to enter Entrance Stairs-Number of Steps: Pt lives on 3rd floor apartment with multiple flights to access.   Home Layout: One level     Bathroom Shower/Tub: Research scientist (life sciences): Rollator (4 wheels);Hand held shower head   Additional Comments: Patient reported plan is to transfer to grandfathers house with 3 steps to enter with family support at time of d/c. patient reported that possiblity of ramp to enter was available if needed after CIR. patient is very motivated to transition to CIR at this time.      Prior Functioning/Environment Prior Level of Function : Needs assist       Physical Assist : Mobility (physical);ADLs (physical) Mobility (physical): Bed mobility;Transfers;Gait ADLs (physical): Bathing;Dressing;Toileting;IADLs   ADLs Comments: pt recently at SNF, staff assisting with self care. prior to CVA patient was independent in ADLs with husband doing cooking.        OT Problem List: Decreased strength;Decreased range of motion;Decreased activity tolerance;Impaired balance (sitting and/or standing);Impaired vision/perception;Decreased coordination;Decreased knowledge of use of DME or AE;Impaired tone;Impaired UE functional use      OT Treatment/Interventions: Self-care/ADL training;Therapeutic exercise;DME and/or AE instruction;Therapeutic activities;Patient/family education;Balance training;Neuromuscular education;Visual/perceptual remediation/compensation    OT Goals(Current goals can be found in the care plan section) Acute Rehab OT Goals Patient Stated Goal: to go to CIR OT Goal Formulation: With patient Time For Goal Achievement: 03/10/21 Potential to Achieve Goals: Good  OT Frequency: Min 2X/week    Co-evaluation              AM-PAC OT "6 Clicks" Daily Activity     Outcome Measure Help from another  person eating meals?: A Little Help from another person taking care of personal grooming?: A Little Help from another person toileting, which includes using toliet, bedpan, or urinal?: A Lot Help from another person bathing (including washing, rinsing, drying)?: A Lot Help from another person to put on  and taking off regular upper body clothing?: A Lot Help from another person to put on and taking off regular lower body clothing?: A Lot 6 Click Score: 14   End of Session Equipment Utilized During Treatment: Gait belt Nurse Communication: Mobility status  Activity Tolerance: Patient tolerated treatment well Patient left: in chair;with call bell/phone within reach;with chair alarm set;with nursing/sitter in room  OT Visit Diagnosis: Unsteadiness on feet (R26.81);Muscle weakness (generalized) (M62.81);Other symptoms and signs involving the nervous system (R29.898);Hemiplegia and hemiparesis Hemiplegia - Right/Left: Left Hemiplegia - dominant/non-dominant: Non-Dominant Hemiplegia - caused by: Cerebral infarction                Time: 1540-0867 OT Time Calculation (min): 25 min Charges:  OT General Charges $OT Visit: 1 Visit OT Evaluation $OT Eval Moderate Complexity: 1 Mod OT Treatments $Self Care/Home Management : 8-22 mins  Jackelyn Poling OTR/L, MS Acute Rehabilitation Department Office# [phone] Pager# [phone]   Marcellina Millin 02/24/2021, 1:14 PM

## 2021-02-24 NOTE — Progress Notes (Signed)
PROGRESS NOTE    Lori [name]  RWE:[id number] DOB: 08/31/1970 DOA: 02/19/2021 PCP: Pcp, No   Brief Narrative:    51 yo female with the past medical history of T2DM, CAD, HTN, CVA,  non ambulatory and pheochromocytoma who presented with vomiting. Recent hospitalization 02/06/21 to 02/15/2021 due to sepsis due to urinary tract infection. She was discharged to SNF. Because of persistent vomiting, she was transferred back to the hospital. On her initial physical examination her blood pressure was 162/102, HR 152, RR 16, temp 99,2 and oxygen saturation 97% on room air.  Na 136, K 3,7, CL 94, bicarbonate 26, glucose 383, BUN 8 and cr 0,86 Anion gap 16. Wbc 11.8, hgb 13,1. Hct 39,7 and plt 581. SARS COVID 19 negative. Head CT with no acute changes. EKG 153 bpm, normal axis, normal qtc, SVT rhythm with positive PVC, no significant ST segment or T wave changes. Patient was placed on IV insulin and IV fluids and IV Cardizem.   Assessment & Plan:  Diabetic Ketoacidosis -Resolved.  Gap is closed -continue sliding scale insulin, long-acting insulin.  Add meal time insulin if able to eat better. -Monitor blood sugar closely.   -Advance diet to carb consistent diet today  nausea vomiting: Resolved -Continue on Phenergan Reglan and Zofran.  Continue Reglan 3 times daily from home.  Likely has diabetic gastroparesis.     Mild hypokalemia.  Replenished.  Potassium 4.5 this morning  Hypomagnesemia: Replenished.  Repeat magnesium level tomorrow a.m.   Hyperthyroidism -Continue methimazole metoprolol   Essential hypertension -Continue metoprolol, lisinopril and amlodipine.  Monitor blood pressure closely  Diabetic neuropathy: Continue gabapentin, Flexeril   CAD (coronary artery disease) -Continue antiplatelets beta-blocker and statin    HLD (hyperlipidemia) -Hold Lipitor for now.  Continue Plavix metoprolol.     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     -PT/OT consulted-recommended CIR-await bed placement  History of depression/anxiety: Continue home meds-Lexapro, Ativan  GERD: Continue PPI  Anemia of chronic disease: H&H is stable.  Continue to monitor  Reactive thrombocytosis: Platelet: 459.  Morbid obesity with BMI 41: Aware.  Diet modification recommended  DVT prophylaxis: Lovenox Code Status: Full code Family Communication:  None present at bedside.  Plan of care discussed with patient in length and she verbalized understanding and agreed with it. Disposition Plan: CIR  Consultants:  None  Procedures:  None  Antimicrobials:  None  Status is: Inpatient    Subjective: Patient seen and examined.  Resting comfortably on the bed.  Denies any symptoms such as nausea, vomiting, fever, chills, chest pain or shortness of breath. Objective: Vitals:   02/23/21 2248 02/24/21 0500 02/24/21 0630 02/24/21 1048  BP: 125/86  (!) 120/91 (!) 133/102  Pulse: 98  81 99  Resp: 18  18 19   Temp: 98.6 F (37 C)  97.7 F (36.5 C) 97.8 F (36.6 C)  TempSrc: Oral  Oral Oral  SpO2: 94%  98% 100%  Weight:  102.1 kg    Height:        Intake/Output Summary (Last 24 hours) at 02/24/2021 1310 Last data filed at 02/24/2021 1300 Gross per 24 hour  Intake 600 ml  Output 1100 ml  Net -500 ml   Filed Weights   02/19/21 1039 02/24/21 0500  Weight: 89.8 kg 102.1 kg    Examination:  General exam: Appears calm and comfortable.  On room air, deconditioned. Respiratory system: Clear to auscultation. Respiratory effort normal. Cardiovascular system: S1 & S2  heard, RRR. No JVD, murmurs, rubs, gallops or clicks. No pedal edema. Gastrointestinal system: Abdomen is nondistended, soft and nontender. No organomegaly or masses felt. Normal bowel sounds heard. Central nervous system: Alert and oriented.  Following commands, answers appropriately.  Bilateral lower extremity weakness.  Dysarthric.   Skin: No rashes, lesions or  ulcers    Data Reviewed: I have personally reviewed following labs and imaging studies  CBC: Recent Labs  Lab 02/19/21 1044 02/21/21 0407 02/22/21 0306 02/23/21 0431  WBC 11.8* 11.1* 9.1 8.3  NEUTROABS 9.6*  --   --   --   HGB 13.1 11.9* 11.4* 10.7*  HCT 39.7 36.8 34.7* 33.1*  MCV 82.4 83.6 83.0 83.2  PLT 581* 547* 492* 017*   Basic Metabolic Panel: Recent Labs  Lab 02/19/21 1606 02/19/21 2130 02/20/21 0511 02/21/21 0407 02/22/21 0306 02/23/21 0431 02/24/21 0943  NA 139   < > 137 134* 135 132* 130*  K 3.9   < > 4.1 3.5 3.4* 4.0 4.5  CL 100   < > 102 99 99 99 99  CO2 30   < > 26 27 28 27 24   GLUCOSE 163*   < > 174* 215* 125* 158* 255*  BUN 7   < > 5* 7 16 14 9   CREATININE 0.70   < > 0.55 0.59 0.90 0.65 0.55  CALCIUM 8.8*   < > 8.5* 8.6* 8.7* 8.2* 8.3*  MG 2.6*  --   --  1.7 2.1 2.0 1.6*  PHOS  --   --   --  4.3  --   --   --    < > = values in this interval not displayed.   GFR: Estimated Creatinine Clearance: 96.3 mL/min (by C-G formula based on SCr of 0.55 mg/dL). Liver Function Tests: Recent Labs  Lab 02/19/21 1044 02/21/21 0407 02/22/21 0306  AST 15 12* 15  ALT 23 18 18   ALKPHOS 94 72 67  BILITOT 0.9 0.6 0.4  PROT 8.4* 7.0 6.5  ALBUMIN 3.9 3.3* 3.1*   Recent Labs  Lab 02/19/21 1044  LIPASE 27   No results for input(s): AMMONIA in the last 168 hours. Coagulation Profile: No results for input(s): INR, PROTIME in the last 168 hours. Cardiac Enzymes: No results for input(s): CKTOTAL, CKMB, CKMBINDEX, TROPONINI in the last 168 hours. BNP (last 3 results) No results for input(s): PROBNP in the last 8760 hours. HbA1C: No results for input(s): HGBA1C in the last 72 hours. CBG: Recent Labs  Lab 02/23/21 0730 02/23/21 1708 02/23/21 2007 02/24/21 0733 02/24/21 1219  GLUCAP 160* 149* 169* 195* 260*   Lipid Profile: No results for input(s): CHOL, HDL, LDLCALC, TRIG, CHOLHDL, LDLDIRECT in the last 72 hours. Thyroid Function Tests: No results  for input(s): TSH, T4TOTAL, FREET4, T3FREE, THYROIDAB in the last 72 hours. Anemia Panel: No results for input(s): VITAMINB12, FOLATE, FERRITIN, TIBC, IRON, RETICCTPCT in the last 72 hours. Sepsis Labs: No results for input(s): PROCALCITON, LATICACIDVEN in the last 168 hours.  Recent Results (from the past 240 hour(s))  Resp Panel by RT-PCR (Flu A&B, Covid) Nasopharyngeal Swab     Status: None   Collection Time: 02/19/21  2:39 PM   Specimen: Nasopharyngeal Swab; Nasopharyngeal(NP) swabs in vial transport medium  Result Value Ref Range Status   SARS Coronavirus 2 by RT PCR NEGATIVE NEGATIVE Final    Comment: (NOTE) SARS-CoV-2 target nucleic acids are NOT DETECTED.  The SARS-CoV-2 RNA is generally detectable in upper respiratory specimens during the  acute phase of infection. The lowest concentration of SARS-CoV-2 viral copies this assay can detect is 138 copies/mL. A negative result does not preclude SARS-Cov-2 infection and should not be used as the sole basis for treatment or other patient management decisions. A negative result may occur with  improper specimen collection/handling, submission of specimen other than nasopharyngeal swab, presence of viral mutation(s) within the areas targeted by this assay, and inadequate number of viral copies(<138 copies/mL). A negative result must be combined with clinical observations, patient history, and epidemiological information. The expected result is Negative.  Fact Sheet for Patients:  EntrepreneurPulse.com.au  Fact Sheet for Healthcare Providers:  IncredibleEmployment.be  This test is no t yet approved or cleared by the [place] FDA and  has been authorized for detection and/or diagnosis of SARS-CoV-2 by FDA under an Emergency Use Authorization (EUA). This EUA will remain  in effect (meaning this test can be used) for the duration of the COVID-19 declaration under Section 564(b)(1) of the Act,  21 U.S.C.section 360bbb-3(b)(1), unless the authorization is terminated  or revoked sooner.       Influenza A by PCR NEGATIVE NEGATIVE Final   Influenza B by PCR NEGATIVE NEGATIVE Final    Comment: (NOTE) The Xpert Xpress SARS-CoV-2/FLU/RSV plus assay is intended as an aid in the diagnosis of influenza from Nasopharyngeal swab specimens and should not be used as a sole basis for treatment. Nasal washings and aspirates are unacceptable for Xpert Xpress SARS-CoV-2/FLU/RSV testing.  Fact Sheet for Patients: EntrepreneurPulse.com.au  Fact Sheet for Healthcare Providers: IncredibleEmployment.be  This test is not yet approved or cleared by the [place] FDA and has been authorized for detection and/or diagnosis of SARS-CoV-2 by FDA under an Emergency Use Authorization (EUA). This EUA will remain in effect (meaning this test can be used) for the duration of the COVID-19 declaration under Section 564(b)(1) of the Act, 21 U.S.C. section 360bbb-3(b)(1), unless the authorization is terminated or revoked.  Performed at [facility], [place] [address] [place], [place] [address]   MRSA Next Gen by PCR, Nasal     Status: None   Collection Time: 02/19/21  4:52 PM   Specimen: Nasal Mucosa; Nasal Swab  Result Value Ref Range Status   MRSA by PCR Next Gen NOT DETECTED NOT DETECTED Final    Comment: (NOTE) The GeneXpert MRSA Assay (FDA approved for NASAL specimens only), is one component of a comprehensive MRSA colonization surveillance program. It is not intended to diagnose MRSA infection nor to guide or monitor treatment for MRSA infections. Test performance is not FDA approved in patients less than 6 years old. Performed at [facility], [place] [address], [place], [place] [address]       Radiology Studies: No results found.  Scheduled Meds:  amLODipine  5 mg Oral Daily   Chlorhexidine Gluconate Cloth  6  each Topical Daily   clopidogrel  75 mg Oral Daily   escitalopram  20 mg Oral Daily   feeding supplement (GLUCERNA SHAKE)  237 mL Oral TID BM   gabapentin  100 mg Oral TID   insulin aspart  0-15 Units Subcutaneous TID WC   insulin aspart  0-5 Units Subcutaneous QHS   insulin aspart  3 Units Subcutaneous TID WC   insulin glargine-yfgn  10 Units Subcutaneous Daily   lisinopril  20 mg Oral Daily   loratadine  10 mg Oral Daily   LORazepam  0.25 mg Oral QHS   mouth rinse  15 mL  Mouth Rinse BID   melatonin  9 mg Oral QHS   methimazole  5 mg Oral Daily   metoCLOPramide (REGLAN) injection  10 mg Intravenous TID AC   metoprolol tartrate  100 mg Oral BID   nystatin   Topical TID   pantoprazole  40 mg Oral Daily   polyethylene glycol  17 g Oral BID   senna  1 tablet Oral QHS   sodium chloride flush  10-40 mL Intracatheter Q12H   sodium chloride flush  3 mL Intravenous Q12H   Continuous Infusions:  sodium chloride     promethazine (PHENERGAN) injection (IM or IVPB) 25 mg (02/23/21 1704)     LOS: 4 days   Time spent: 35-minute  Mckinley Jewel, MD [facility]  If 7PM-7AM, please contact night-coverage [email or web address] 02/24/2021, 1:10 PM

## 2021-02-25 LAB — BASIC METABOLIC PANEL
Anion gap: 6 (ref 5–15)
BUN: 14 mg/dL (ref 6–20)
CO2: 25 mmol/L (ref 22–32)
Calcium: 8.6 mg/dL — ABNORMAL LOW (ref 8.9–10.3)
Chloride: 100 mmol/L (ref 98–111)
Creatinine, Ser: 0.6 mg/dL (ref 0.44–1.00)
GFR, Estimated: >60 mL/min (ref >60)
Glucose, Bld: 248 mg/dL — ABNORMAL HIGH (ref 70–99)
Potassium: 4.6 mmol/L (ref 3.5–5.1)
Sodium: 131 mmol/L — ABNORMAL LOW (ref 135–145)

## 2021-02-25 LAB — GLUCOSE, CAPILLARY
Glucose-Capillary: 218 mg/dL — ABNORMAL HIGH (ref 70–99)
Glucose-Capillary: 266 mg/dL — ABNORMAL HIGH (ref 70–99)
Glucose-Capillary: 142 mg/dL — ABNORMAL HIGH (ref 70–99)
Glucose-Capillary: 190 mg/dL — ABNORMAL HIGH (ref 70–99)

## 2021-02-25 LAB — MAGNESIUM: Magnesium: 1.9 mg/dL (ref 1.7–2.4)

## 2021-02-25 MED ORDER — ATORVASTATIN CALCIUM 40 MG PO TABS
80.0000 mg | ORAL_TABLET | Freq: Every day | ORAL | Status: DC
  Administered 2021-02-25 – 2021-02-26 (×2): 80 mg via ORAL
  Filled 2021-02-25 (×2): qty 2

## 2021-02-25 MED ORDER — INSULIN GLARGINE-YFGN 100 UNIT/ML ~~LOC~~ SOLN
25.0000 [IU] | Freq: Every day | SUBCUTANEOUS | Status: DC
  Administered 2021-02-25: 25 [IU] via SUBCUTANEOUS
  Filled 2021-02-25 (×2): qty 0.25

## 2021-02-25 MED ORDER — ENOXAPARIN SODIUM 60 MG/0.6ML IJ SOSY
50.0000 mg | PREFILLED_SYRINGE | INTRAMUSCULAR | Status: DC
  Administered 2021-02-25: 50 mg via SUBCUTANEOUS
  Filled 2021-02-25 (×2): qty 0.6

## 2021-02-25 NOTE — Progress Notes (Signed)
Inpatient Rehab Admissions Coordinator:   I spoke with Pt. Regarding potential CIR admit. She is interested. I reached out to her daughter to confirm that she can provide 24/7 support at d/c and am awaiting a callback. I will follow and pursue for potential admission once disposition is confirmed.   Clemens Catholic, [facility] Admissions Coordinator  [phone] ([place]) [phone] (office)

## 2021-02-25 NOTE — Progress Notes (Signed)
PROGRESS NOTE    Lori [name]  QIH:[id number] DOB: 1970-07-01 DOA: 02/19/2021 PCP: Pcp, No   Brief Narrative:    51 yo female with the past medical history of T2DM, CAD, HTN, CVA,  non ambulatory and pheochromocytoma who presented with vomiting. Recent hospitalization 02/06/21 to 02/15/2021 due to sepsis due to urinary tract infection. She was discharged to SNF. Because of persistent vomiting, she was transferred back to the hospital. On her initial physical examination her blood pressure was 162/102, HR 152, RR 16, temp 99,2 and oxygen saturation 97% on room air.  Na 136, K 3,7, CL 94, bicarbonate 26, glucose 383, BUN 8 and cr 0,86 Anion gap 16. Wbc 11.8, hgb 13,1. Hct 39,7 and plt 581. SARS COVID 19 negative. Head CT with no acute changes. EKG 153 bpm, normal axis, normal qtc, SVT rhythm with positive PVC, no significant ST segment or T wave changes. Patient was placed on IV insulin and IV fluids and IV Cardizem.  Patient admitted under hospitalist service for further evaluation and management of DKA.  Assessment & Plan:  Diabetic Ketoacidosis -Resolved.  Gap is closed -continue sliding scale insulin, meal time insulin TID, increase long-acting Semglee from 10 units to 25 units nightly since her blood sugar has been high -Monitor blood sugar closely.   -Continue carb consistent diet  nausea vomiting: Resolved -Continue on Phenergan Reglan and Zofran.   -Continue Reglan 3 times daily from home.  Likely has diabetic gastroparesis.     Mild hypokalemia.  Replenished.  Potassium 4.6 this morning  Hypomagnesemia: Replenished.     Hyperthyroidism -Continue methimazole.   Essential hypertension -Continue metoprolol, lisinopril and amlodipine.  Monitor blood pressure closely  Diabetic neuropathy: Continue gabapentin, Flexeril   CAD (coronary artery disease): -Continue Plavix, metoprolol and statin  Hyperlipidemia: -Continue statin.     CVA (cerebral vascular accident) with  functional paraparesis -Continue supportive care.     -PT/OT consulted-recommended CIR-await bed placement  History of depression/anxiety: Continue home meds-Lexapro, Ativan  GERD: Continue PPI  Anemia of chronic disease: H&H is stable.  Continue to monitor  Reactive thrombocytosis: Platelet: 459.  Morbid obesity with BMI 41: Aware.  Diet modification recommended  DVT prophylaxis: Lovenox Code Status: Full code Family Communication:  None present at bedside.  Plan of care discussed with patient in length and she verbalized understanding and agreed with it.  I tried to call patient's daughter with no response  Disposition Plan: CIR  Consultants:  None  Procedures:  None  Antimicrobials:  None  Status is: Inpatient    Subjective: Patient seen and examined.  Resting comfortably on the bed.  Denies any new complaints.  No nausea, vomiting, abdominal pain, urinary or bowel changes.  She is wondering when she can go to inpatient rehab.  Objective: Vitals:   02/24/21 2040 02/25/21 0211 02/25/21 0613 02/25/21 1056  BP: 131/86 115/81 (!) 149/111 (!) 142/102  Pulse: 95 91 79 98  Resp: 18 18 18 18   Temp: 98.1 F (36.7 C) 98 F (36.7 C) (!) 97.5 F (36.4 C) (!) 97.5 F (36.4 C)  TempSrc: Oral Oral Oral Oral  SpO2: 96% 96% 97% 99%  Weight:      Height:        Intake/Output Summary (Last 24 hours) at 02/25/2021 1129 Last data filed at 02/25/2021 1100 Gross per 24 hour  Intake 1053 ml  Output 600 ml  Net 453 ml    Filed Weights   02/19/21 1039 02/24/21 0500  Weight: 89.8  kg 102.1 kg    Examination:  General exam: Appears calm and comfortable.  On room air, deconditioned. Respiratory system: Clear to auscultation. Respiratory effort normal. Cardiovascular system: S1 & S2 heard, RRR. No JVD, murmurs, rubs, gallops or clicks. No pedal edema. Gastrointestinal system: Abdomen is obese, soft and nontender. No organomegaly or masses felt. Normal bowel sounds  heard. Central nervous system: Alert and oriented.  Following commands, answers appropriately.  Left-sided weakness.  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   Skin: No rashes, lesions or ulcers    Data Reviewed: I have personally reviewed following labs and imaging studies  CBC: Recent Labs  Lab 02/19/21 1044 02/21/21 0407 02/22/21 0306 02/23/21 0431  WBC 11.8* 11.1* 9.1 8.3  NEUTROABS 9.6*  --   --   --   HGB 13.1 11.9* 11.4* 10.7*  HCT 39.7 36.8 34.7* 33.1*  MCV 82.4 83.6 83.0 83.2  PLT 581* 547* 492* 459*    Basic Metabolic Panel: Recent Labs  Lab 02/21/21 0407 02/22/21 0306 02/23/21 0431 02/24/21 0943 02/25/21 0542  NA 134* 135 132* 130* 131*  K 3.5 3.4* 4.0 4.5 4.6  CL 99 99 99 99 100  CO2 27 28 27 24 25   GLUCOSE 215* 125* 158* 255* 248*  BUN 7 16 14 9 14   CREATININE 0.59 0.90 0.65 0.55 0.60  CALCIUM 8.6* 8.7* 8.2* 8.3* 8.6*  MG 1.7 2.1 2.0 1.6* 1.9  PHOS 4.3  --   --   --   --     GFR: Estimated Creatinine Clearance: 96.3 mL/min (by C-G formula based on SCr of 0.6 mg/dL). Liver Function Tests: Recent Labs  Lab 02/19/21 1044 02/21/21 0407 02/22/21 0306  AST 15 12* 15  ALT 23 18 18   ALKPHOS 94 72 67  BILITOT 0.9 0.6 0.4  PROT 8.4* 7.0 6.5  ALBUMIN 3.9 3.3* 3.1*    Recent Labs  Lab 02/19/21 1044  LIPASE 27    No results for input(s): AMMONIA in the last 168 hours. Coagulation Profile: No results for input(s): INR, PROTIME in the last 168 hours. Cardiac Enzymes: No results for input(s): CKTOTAL, CKMB, CKMBINDEX, TROPONINI in the last 168 hours. BNP (last 3 results) No results for input(s): PROBNP in the last 8760 hours. HbA1C: No results for input(s): HGBA1C in the last 72 hours. CBG: Recent Labs  Lab 02/24/21 0733 02/24/21 1219 02/24/21 1608 02/24/21 2037 02/25/21 0805  GLUCAP 195* 260* 153* 159* 218*    Lipid Profile: No results for input(s): CHOL, HDL, LDLCALC, TRIG, CHOLHDL, LDLDIRECT in the last 72 hours. Thyroid  Function Tests: No results for input(s): TSH, T4TOTAL, FREET4, T3FREE, THYROIDAB in the last 72 hours. Anemia Panel: No results for input(s): VITAMINB12, FOLATE, FERRITIN, TIBC, IRON, RETICCTPCT in the last 72 hours. Sepsis Labs: No results for input(s): PROCALCITON, LATICACIDVEN in the last 168 hours.  Recent Results (from the past 240 hour(s))  Resp Panel by RT-PCR (Flu A&B, Covid) Nasopharyngeal Swab     Status: None   Collection Time: 02/19/21  2:39 PM   Specimen: Nasopharyngeal Swab; Nasopharyngeal(NP) swabs in vial transport medium  Result Value Ref Range Status   SARS Coronavirus 2 by RT PCR NEGATIVE NEGATIVE Final    Comment: (NOTE) SARS-CoV-2 target nucleic acids are NOT DETECTED.  The SARS-CoV-2 RNA is generally detectable in upper respiratory specimens during the acute phase of infection. The lowest concentration of SARS-CoV-2 viral copies this assay can detect is 138 copies/mL. A negative result does not preclude SARS-Cov-2 infection and  should not be used as the sole basis for treatment or other patient management decisions. A negative result may occur with  improper specimen collection/handling, submission of specimen other than nasopharyngeal swab, presence of viral mutation(s) within the areas targeted by this assay, and inadequate number of viral copies(<138 copies/mL). A negative result must be combined with clinical observations, patient history, and epidemiological information. The expected result is Negative.  Fact Sheet for Patients:  EntrepreneurPulse.com.au  Fact Sheet for Healthcare Providers:  IncredibleEmployment.be  This test is no t yet approved or cleared by the [place] FDA and  has been authorized for detection and/or diagnosis of SARS-CoV-2 by FDA under an Emergency Use Authorization (EUA). This EUA will remain  in effect (meaning this test can be used) for the duration of the COVID-19 declaration under  Section 564(b)(1) of the Act, 21 U.S.C.section 360bbb-3(b)(1), unless the authorization is terminated  or revoked sooner.       Influenza A by PCR NEGATIVE NEGATIVE Final   Influenza B by PCR NEGATIVE NEGATIVE Final    Comment: (NOTE) The Xpert Xpress SARS-CoV-2/FLU/RSV plus assay is intended as an aid in the diagnosis of influenza from Nasopharyngeal swab specimens and should not be used as a sole basis for treatment. Nasal washings and aspirates are unacceptable for Xpert Xpress SARS-CoV-2/FLU/RSV testing.  Fact Sheet for Patients: EntrepreneurPulse.com.au  Fact Sheet for Healthcare Providers: IncredibleEmployment.be  This test is not yet approved or cleared by the [place] FDA and has been authorized for detection and/or diagnosis of SARS-CoV-2 by FDA under an Emergency Use Authorization (EUA). This EUA will remain in effect (meaning this test can be used) for the duration of the COVID-19 declaration under Section 564(b)(1) of the Act, 21 U.S.C. section 360bbb-3(b)(1), unless the authorization is terminated or revoked.  Performed at [facility], [place] [address], [place], [place] [address]   MRSA Next Gen by PCR, Nasal     Status: None   Collection Time: 02/19/21  4:52 PM   Specimen: Nasal Mucosa; Nasal Swab  Result Value Ref Range Status   MRSA by PCR Next Gen NOT DETECTED NOT DETECTED Final    Comment: (NOTE) The GeneXpert MRSA Assay (FDA approved for NASAL specimens only), is one component of a comprehensive MRSA colonization surveillance program. It is not intended to diagnose MRSA infection nor to guide or monitor treatment for MRSA infections. Test performance is not FDA approved in patients less than 56 years old. Performed at [facility], [place] [address] [place], [place] [address]        Radiology Studies: No results found.  Scheduled Meds:  amLODipine  5 mg Oral Daily    Chlorhexidine Gluconate Cloth  6 each Topical Daily   clopidogrel  75 mg Oral Daily   escitalopram  20 mg Oral Daily   feeding supplement (GLUCERNA SHAKE)  237 mL Oral TID BM   gabapentin  100 mg Oral TID   insulin aspart  0-15 Units Subcutaneous TID WC   insulin aspart  0-5 Units Subcutaneous QHS   insulin aspart  3 Units Subcutaneous TID WC   insulin glargine-yfgn  25 Units Subcutaneous QHS   lisinopril  20 mg Oral Daily   loratadine  10 mg Oral Daily   LORazepam  0.25 mg Oral QHS   mouth rinse  15 mL Mouth Rinse BID   melatonin  9 mg Oral QHS   methimazole  5 mg Oral Daily   metoCLOPramide (REGLAN) injection  10  mg Intravenous TID AC   metoprolol tartrate  100 mg Oral BID   nystatin   Topical TID   pantoprazole  40 mg Oral Daily   polyethylene glycol  17 g Oral BID   senna  1 tablet Oral QHS   sodium chloride flush  10-40 mL Intracatheter Q12H   Continuous Infusions:  sodium chloride     promethazine (PHENERGAN) injection (IM or IVPB) 25 mg (02/23/21 1704)     LOS: 5 days   Time spent: 35-minute  Mckinley Jewel, MD [facility]  If 7PM-7AM, please contact night-coverage [email or web address] 02/25/2021, 11:29 AM

## 2021-02-25 NOTE — PMR Pre-admission (Signed)
PMR Admission Coordinator Pre-Admission Assessment  Patient: Lori [name] is an 51 y.o., female MRN: [id number] DOB: [birth year] Height: 5\' 2"  (157.5 cm) Weight: 102.1 kg  Insurance Information HMO:     PPO:      PCP:      IPA:      80/20: Yes     OTHER:  PRIMARY: Medicare part A       Policy#: [id number]      Subscriber:  Phone#: Verified online    Fax#:  Pre-Cert#:       Employer:  Benefits:  Phone #:      Name:  Eff. Date: Parts A ad B effective  02/04/2014 Deduct: $1600      Out of Pocket Max:  None      Life Max: N/A  CIR: 100%      SNF: 100 days Outpatient: 80%     Co-Pay: 20% Home Health: 100%      Co-Pay: none DME: 80%     Co-Pay: 20% Providers: patient's choice  SECONDARY: [place] medicaid Healthy Blue       Policy#: [id number]     Phone#:   Financial Counselor:       Phone#:   The Actuary for patients in Inpatient Rehabilitation Facilities with attached Privacy Act [place] Records was provided and verbally reviewed with: Patient  Emergency Contact Information Contact Information     Name Relation Home Work Mobile   [name],[name] Daughter [phone]  [phone]   Jonette Mate   [phone]       Current Medical History  Patient Admitting Diagnosis: DKA, Debility History of Present Illness: Lori [name] is a 51 y.o. 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  She was discharged  there on the previous Friday and stated that she did not receive her insulin or  her prescribed medications.  She also stated that she fell face down and has a small abrasion on her lip. nitial vital signs were temperature 99.2 F, pulse 152, respiration 16, BP 162/102 mmHg O2 sat  97% on room air.  The patient was started on an insulin infusion, received 10 mg of diltiazem 10 IVP followed by continuous infusion and 2000 mL of bolus. In the ED, Her CBC is her white count 11.8 with 82% neutrophils, hemoglobin 13.1 g/dL platelets 581.  BHA was 2.72 mmol/L.  CMP showed a chloride of 94 mmol/L, with an anion gap of 16, glucose 383 mg/dL total protein 8.4 g/dL.  Lipase was normal.CT head without contrast no acute intracranial pathology.  Multiple remote lacunar infarcts.  CT maxillofacial no acute facial bone fractures.  Please see images and full radiology report for further details. Pt. Admitted for diabetic ketoacidosis. Pt. Seen by PT and OT who recommended CIR to assist return to PLOF.     Patient's medical record from [facility]  has been reviewed by the rehabilitation admission coordinator and physician.  Past Medical History  Past Medical History:  Diagnosis Date   CAD (coronary artery disease)    Depression    Diabetes mellitus without complication ([place])    Hypertension    Pheochromocytoma    Pheochromocytoma    Stroke [facility])    Thyroid disease     Has the patient had major surgery during 100  days prior to admission? No  Family History   family history includes Brain cancer in her brother; Diabetes in her brother and mother.  Current Medications  Current Facility-Administered Medications:    0.9 %  sodium chloride infusion, 250 mL, Intravenous, PRN, Kathryne Eriksson, NP   acetaminophen (TYLENOL) tablet 650 mg, 650 mg, Oral, Q6H PRN, Pokhrel, Laxman, MD, 650 mg at 02/21/21 1558   amLODipine (NORVASC) tablet 5 mg, 5 mg, Oral, Daily, Pokhrel, Laxman, MD, 5 mg at 02/26/21 0943   atorvastatin (LIPITOR) tablet 80 mg, 80 mg, Oral, Daily, Pahwani, Rinka R, MD, 80 mg at 02/26/21 0944   Chlorhexidine Gluconate Cloth 2 % PADS 6 each, 6 each, Topical, Daily, Reubin Milan, MD, 6 each at 02/26/21 0947   clopidogrel (PLAVIX) tablet 75 mg, 75 mg, Oral, Daily,  Pokhrel, Laxman, MD, 75 mg at 02/26/21 0944   cyclobenzaprine (FLEXERIL) tablet 5 mg, 5 mg, Oral, TID PRN, Pokhrel, Laxman, MD, 5 mg at 02/26/21 0310   dextrose 50 % solution 0-50 mL, 0-50 mL, Intravenous, PRN, Reubin Milan, MD   enoxaparin (LOVENOX) injection 50 mg, 50 mg, Subcutaneous, Q24H, Pahwani, Rinka R, MD, 50 mg at 02/25/21 1418   escitalopram (LEXAPRO) tablet 20 mg, 20 mg, Oral, Daily, Pokhrel, Laxman, MD, 20 mg at 02/26/21 0943   feeding supplement (GLUCERNA SHAKE) (GLUCERNA SHAKE) liquid 237 mL, 237 mL, Oral, TID BM, Pokhrel, Laxman, MD, 237 mL at 02/25/21 2005   gabapentin (NEURONTIN) capsule 100 mg, 100 mg, Oral, TID, Pokhrel, Laxman, MD, 100 mg at 02/26/21 0943   insulin aspart (novoLOG) injection 0-15 Units, 0-15 Units, Subcutaneous, TID WC, Kathryne Eriksson, NP, 8 Units at 02/26/21 0745   insulin aspart (novoLOG) injection 0-5 Units, 0-5 Units, Subcutaneous, QHS, Kathryne Eriksson, NP, 2 Units at 02/22/21 2351   insulin aspart (novoLOG) injection 3 Units, 3 Units, Subcutaneous, TID WC, Pokhrel, Laxman, MD, 3 Units at 02/26/21 0746   insulin glargine-yfgn (SEMGLEE) injection 25 Units, 25 Units, Subcutaneous, QHS, Pahwani, Rinka R, MD, 25 Units at 02/25/21 2139   lip balm (CARMEX) ointment, , Topical, PRN, Pokhrel, Laxman, MD, 75 application at 69/62/95 1522   lisinopril (ZESTRIL) tablet 20 mg, 20 mg, Oral, Daily, Pokhrel, Laxman, MD, 20 mg at 02/26/21 0944   loratadine (CLARITIN) tablet 10 mg, 10 mg, Oral, Daily, Pahwani, Rinka R, MD, 10 mg at 02/26/21 0944   LORazepam (ATIVAN) tablet 0.25 mg, 0.25 mg, Oral, QHS, Pokhrel, Laxman, MD, 0.25 mg at 02/25/21 2128   MEDLINE mouth rinse, 15 mL, Mouth Rinse, BID, Reubin Milan, MD, 15 mL at 02/25/21 2130   melatonin tablet 9 mg, 9 mg, Oral, QHS, Pokhrel, Laxman, MD, 9 mg at 02/25/21 2128   methimazole (TAPAZOLE) tablet 5 mg, 5 mg, Oral, Daily, Pokhrel, Laxman, MD, 5 mg at 02/26/21 0944   metoCLOPramide (REGLAN) injection 10 mg,  10 mg, Intravenous, TID AC, Pokhrel, Laxman, MD, 10 mg at 02/26/21 0739   metoprolol tartrate (LOPRESSOR) tablet 100 mg, 100 mg, Oral, BID, Pokhrel, Laxman, MD, 100 mg at 02/26/21 0943   nystatin (MYCOSTATIN/NYSTOP) topical powder, , Topical, TID, Reubin Milan, MD, Given at 02/26/21 0947   ondansetron (ZOFRAN) injection 4 mg, 4 mg, Intravenous, Q6H PRN, Pokhrel, Laxman, MD, 4 mg at 02/25/21 0755   pantoprazole (PROTONIX) EC tablet 40 mg, 40 mg, Oral, Daily, Arrien, Jimmy Picket, MD, 40 mg at 02/26/21 0943   polyethylene glycol (MIRALAX / GLYCOLAX) packet 17 g, 17 g, Oral, BID, Pokhrel, Laxman, MD,  17 g at 02/26/21 0943   promethazine (PHENERGAN) 25 mg in sodium chloride 0.9 % 50 mL IVPB, 25 mg, Intravenous, Q6H PRN, Reubin Milan, MD, Last Rate: 200 mL/hr at 02/23/21 1704, 25 mg at 02/23/21 1704   senna (SENOKOT) tablet 8.6 mg, 1 tablet, Oral, QHS, Pokhrel, Laxman, MD, 8.6 mg at 02/25/21 2128   sodium chloride flush (NS) 0.9 % injection 10-40 mL, 10-40 mL, Intracatheter, Q12H, Reubin Milan, MD, 10 mL at 02/26/21 0944   sodium chloride flush (NS) 0.9 % injection 10-40 mL, 10-40 mL, Intracatheter, PRN, Reubin Milan, MD, 10 mL at 02/23/21 0433   sodium chloride flush (NS) 0.9 % injection 3 mL, 3 mL, Intravenous, PRN, Kathryne Eriksson, NP   zolpidem (AMBIEN) tablet 5 mg, 5 mg, Oral, QHS PRN, Pokhrel, Laxman, MD  Patients Current Diet:  Diet Order             Diet Carb Modified Fluid consistency: Thin; Room service appropriate? Yes  Diet effective now                   Precautions / Restrictions Precautions Precautions: Fall Precaution Comments: L hemi (UE>LE), PICC R UE Restrictions Weight Bearing Restrictions: No   Has the patient had 2 or more falls or a fall with injury in the past year? Yes  Prior Activity Level Limited Community (1-2x/wk): Pt. went out for appointments  Prior Functional Level Self Care: Did the patient need help bathing, dressing,  using the toilet or eating? Needed some help  Indoor Mobility: Did the patient need assistance with walking from room to room (with or without device)? Needed some help  Stairs: Did the patient need assistance with internal or external stairs (with or without device)? Needed some help  Functional Cognition: Did the patient need help planning regular tasks such as shopping or remembering to take medications? Needed some help  Patient Information Are you of Hispanic, Latino/a,or Spanish origin?: A. No, not of Hispanic, Latino/a, or Spanish origin What is your race?: A. White, B. Black or African American Do you need or want an interpreter to communicate with a doctor or health care staff?: 0. No  Patient's Response To:  Health Literacy and Transportation Is the patient able to respond to health literacy and transportation needs?: Yes Health Literacy - How often do you need to have someone help you when you read instructions, pamphlets, or other written material from your doctor or pharmacy?: Never In the past 12 months, has lack of transportation kept you from medical appointments or from getting medications?: No In the past 12 months, has lack of transportation kept you from meetings, work, or from getting things needed for daily living?: No  Development worker, international aid / [place] Devices/Equipment: Eyeglasses Home Equipment: Rollator (4 wheels), Hand held shower head  Prior Device Use: Indicate devices/aids used by the patient prior to current illness, exacerbation or injury? Manual wheelchair and Walker  Current Functional Level Cognition  Overall Cognitive Status: Within Functional Limits for tasks assessed Orientation Level: Oriented X4 General Comments: patient is motivated during session, patient is motivated to get to CIR    Extremity Assessment (includes Sensation/Coordination)  Upper Extremity Assessment: RUE deficits/detail, LUE deficits/detail RUE Deficits /  Details: ROM WFL, MMT grossly 4-/5 LUE Deficits / Details: patient able to extend elbow AROM with noted flexor synergy pattern noted with sneezing and anxiousness. patient noted to have pain with PROM of middle digit with h/o fx. patient noted  to have contracture with inability to PROM wrist into neutral with increased tone on this date. patient no AROM and limited AAROM about 15 degrees of flexion with increased tone noted with attempt to range. LUE Coordination: decreased fine motor, decreased gross motor  Lower Extremity Assessment: Defer to PT evaluation RLE Deficits / Details: AROM WNL, strength 4/5 throughout LLE Deficits / Details: ankle AROM WNL, knee extension 3+/5, hip flexion 3+/5, hip abduction and adduction 3+/5    ADLs  Overall ADL's : Needs assistance/impaired Eating/Feeding: Set up, Sitting Eating/Feeding Details (indicate cue type and reason): to take small sips. Grooming: Wash/dry hands, Wash/dry face, Sitting, Minimal assistance Upper Body Bathing: Sitting, Moderate assistance Lower Body Bathing: Total assistance, Bed level Upper Body Dressing : Maximal assistance, Sitting Lower Body Dressing: Bed level, Total assistance Toilet Transfer: Moderate assistance, Maximal assistance, Control and instrumentation engineer Details (indicate cue type and reason): simulated to bedside chair with attempts to stand pivot and some scooting as well. Toileting- Clothing Manipulation and Hygiene: Maximal assistance, Sit to/from stand, +2 for physical assistance Functional mobility during ADLs: +2 for physical assistance, +2 for safety/equipment General ADL Comments: Marland Kitchen    Mobility  Overal bed mobility: Needs Assistance Bed Mobility: Supine to Sit Supine to sit: Max assist, HOB elevated General bed mobility comments: able to initiate but needed MaxA to pivot hips around to being square at EOB    Transfers  Overall transfer level: Needs assistance Equipment used: 1 person hand held  assist Transfers: Sit to/from Stand, Bed to chair/wheelchair/BSC Sit to Stand: Mod assist Bed to/from chair/wheelchair/BSC transfer type:: Stand pivot Stand pivot transfers: Mod assist General transfer comment: on first attempt needed MaxA to stand, able to power up with [place] on second attempt and pivoted hips over to reclier with modA for balance/VC for sequencing and safety    Ambulation / Gait / Stairs / Wheelchair Mobility  Ambulation/Gait General Gait Details: unable with +1 assist    Posture / Balance Dynamic Sitting Balance Sitting balance - Comments: patient was able to self correct sitting balance with min guard. Balance Overall balance assessment: Needs assistance Sitting-balance support: Feet supported, Single extremity supported Sitting balance-Leahy Scale: Fair Sitting balance - Comments: patient was able to self correct sitting balance with min guard. Postural control: Posterior lean Standing balance support: During functional activity, Single extremity supported Standing balance-Leahy Scale: Poor Standing balance comment: consistent UE support    Special needs/care consideration Skin abrasion/excoriation to Lip and Left leg, ecchymosis of bilateral arms, excoriation on bilateral breasts and Diabetic management novolog 0-15 units sQ 3x day with meals, Novolog 0-5 units sQ daily at bedtime, and 3 units sQ daily with meals; Semglee 25 units sQ daily at bedtime.     Previous Home Environment (from acute therapy documentation) Living Arrangements: Children  Lives With: Family Available Help at Discharge: Family, Available 24 hours/day Type of Home: [place] Name:  (Searching for another one) Home Layout: One level Home Access: Stairs to enter [employer] of Steps: Pt lives on 3rd floor apartment with multiple flights to access. Bathroom Shower/Tub: [employer] Home Care Services: No Additional Comments: Patient reported plan is to transfer to  grandfathers house with 3 steps to enter with family support at time of d/c. patient reported that possiblity of ramp to enter was available if needed after CIR. patient is very motivated to transition to CIR at this time.  Discharge Living Setting Plans for Discharge Living Setting: House Type of Home at Discharge: [facility] Discharge  Home Layout: One level Discharge Home Access: Stairs to enter Discharge Bathroom Shower/Tub: Walk-in shower Discharge Bathroom Toilet: Handicapped height Discharge Bathroom Accessibility: Yes How Accessible: Accessible via walker Does the patient have any problems obtaining your medications?: Yes (Describe)  Social/Family/Support Systems Patient Roles: Spouse Contact Information: [phone] Anticipated Caregiver: [place] [facility] Anticipated Caregiver's Contact Information: [phone] Caregiver Availability: 24/7 Discharge Plan Discussed with Primary Caregiver: Yes Is Caregiver In Agreement with Plan?: Yes  Goals Patient/Family Goal for Rehab: PT/OT MIn A Expected length of stay: 14-16 days Pt/Family Agrees to Admission and willing to participate: Yes Program Orientation Provided & Reviewed with Pt/Caregiver Including Roles  & Responsibilities: Yes  Decrease burden of Care through IP rehab admission: Specialzed equipment needs, Decrease number of caregivers, Bowel and bladder program, and Patient/family education  Possible need for SNF placement upon discharge: not anticipated   Patient Condition: I have reviewed medical records from [facility], spoken with CM, and met with patient at the bedsidepatient and daughter. I  for inpatient rehabilitation assessment.  Patient will benefit from ongoing PT and OT, can actively participate in 3 hours of therapy a day 5 days of the week, and can make measurable gains during the admission.  Patient will also benefit from the coordinated team approach during an Inpatient Acute Rehabilitation admission.   The patient will receive intensive therapy as well as Rehabilitation physician, nursing, social worker, and care management interventions.  Due to bladder management, bowel management, safety, skin/wound care, disease management, medication administration, pain management, and patient education the patient requires 24 hour a day rehabilitation nursing.  The patient is currently mod A with mobility and basic ADLs.  Discharge setting and therapy post discharge at home with home health is anticipated.  Patient has agreed to participate in the Acute Inpatient Rehabilitation Program and will admit today.  Preadmission Screen Completed By:  Genella Mech, 02/26/2021 10:28 AM ______________________________________________________________________   Discussed status with Dr. Naaman Plummer on 1/23/223 at 71 and received approval for admission today.  Admission Coordinator:  Genella Mech, CCC-SLP, time 1000/Date 11/26/21   Assessment/Plan: Diagnosis: debility related to DKA and multiple medical Does the need for close, 24 hr/day Medical supervision in concert with the patient's rehab needs make it unreasonable for this patient to be served in a less intensive setting? Yes Co-Morbidities requiring supervision/potential complications: CAD, DM, HTN Due to bladder management, bowel management, safety, skin/wound care, disease management, medication administration, pain management, and patient education, does the patient require 24 hr/day rehab nursing? Yes Does the patient require coordinated care of a physician, rehab nurse, PT, OT to address physical and functional deficits in the context of the above medical diagnosis(es)? Yes Addressing deficits in the following areas: balance, endurance, locomotion, strength, transferring, bowel/bladder control, bathing, dressing, feeding, grooming, toileting, and psychosocial support Can the patient actively participate in an intensive therapy program of at least 3 hrs of therapy  5 days a week? Yes The potential for patient to make measurable gains while on inpatient rehab is excellent Anticipated functional outcomes upon discharge from inpatient rehab: min assist PT, min assist OT, n/a SLP Estimated rehab length of stay to reach the above functional goals is: 14-16 days Anticipated discharge destination: Home 10. Overall Rehab/Functional Prognosis: excellent   MD Signature: Meredith Staggers, MD, [place] Director Rehabilitation Services 02/26/2021

## 2021-02-26 ENCOUNTER — Other Ambulatory Visit: Payer: Self-pay

## 2021-02-26 ENCOUNTER — Encounter (HOSPITAL_COMMUNITY): Payer: Self-pay | Admitting: Physical Medicine and Rehabilitation

## 2021-02-26 ENCOUNTER — Inpatient Hospital Stay (HOSPITAL_COMMUNITY)
Admission: RE | Admit: 2021-02-26 | Discharge: 2021-03-16 | DRG: 945 | Disposition: A | Discharge status: Home Health | Payer: Medicare Other | Source: Other Acute Inpatient Hospital | Attending: Physical Medicine and Rehabilitation | Admitting: Physical Medicine and Rehabilitation

## 2021-02-26 DIAGNOSIS — E896 Postprocedural adrenocortical (-medullary) hypofunction: Secondary | ICD-10-CM | POA: Diagnosis present

## 2021-02-26 DIAGNOSIS — Z7982 Long term (current) use of aspirin: Secondary | ICD-10-CM

## 2021-02-26 DIAGNOSIS — Z951 Presence of aortocoronary bypass graft: Secondary | ICD-10-CM | POA: Diagnosis not present

## 2021-02-26 DIAGNOSIS — E785 Hyperlipidemia, unspecified: Secondary | ICD-10-CM | POA: Diagnosis present

## 2021-02-26 DIAGNOSIS — R5381 Other malaise: Secondary | ICD-10-CM | POA: Diagnosis present

## 2021-02-26 DIAGNOSIS — Z808 Family history of malignant neoplasm of other organs or systems: Secondary | ICD-10-CM

## 2021-02-26 DIAGNOSIS — R339 Retention of urine, unspecified: Secondary | ICD-10-CM | POA: Diagnosis not present

## 2021-02-26 DIAGNOSIS — Z880 Allergy status to penicillin: Secondary | ICD-10-CM | POA: Diagnosis not present

## 2021-02-26 DIAGNOSIS — E1143 Type 2 diabetes mellitus with diabetic autonomic (poly)neuropathy: Secondary | ICD-10-CM | POA: Diagnosis present

## 2021-02-26 DIAGNOSIS — Z91041 Radiographic dye allergy status: Secondary | ICD-10-CM

## 2021-02-26 DIAGNOSIS — Z79899 Other long term (current) drug therapy: Secondary | ICD-10-CM

## 2021-02-26 DIAGNOSIS — F419 Anxiety disorder, unspecified: Secondary | ICD-10-CM | POA: Diagnosis present

## 2021-02-26 DIAGNOSIS — Z8673 Personal history of transient ischemic attack (TIA), and cerebral infarction without residual deficits: Secondary | ICD-10-CM

## 2021-02-26 DIAGNOSIS — Z888 Allergy status to other drugs, medicaments and biological substances status: Secondary | ICD-10-CM

## 2021-02-26 DIAGNOSIS — K5900 Constipation, unspecified: Secondary | ICD-10-CM | POA: Diagnosis present

## 2021-02-26 DIAGNOSIS — E1169 Type 2 diabetes mellitus with other specified complication: Secondary | ICD-10-CM | POA: Diagnosis present

## 2021-02-26 DIAGNOSIS — I639 Cerebral infarction, unspecified: Secondary | ICD-10-CM

## 2021-02-26 DIAGNOSIS — E871 Hypo-osmolality and hyponatremia: Secondary | ICD-10-CM

## 2021-02-26 DIAGNOSIS — G8114 Spastic hemiplegia affecting left nondominant side: Secondary | ICD-10-CM

## 2021-02-26 DIAGNOSIS — Z833 Family history of diabetes mellitus: Secondary | ICD-10-CM | POA: Diagnosis not present

## 2021-02-26 DIAGNOSIS — I672 Cerebral atherosclerosis: Secondary | ICD-10-CM | POA: Diagnosis present

## 2021-02-26 DIAGNOSIS — I69354 Hemiplegia and hemiparesis following cerebral infarction affecting left non-dominant side: Secondary | ICD-10-CM

## 2021-02-26 DIAGNOSIS — E669 Obesity, unspecified: Secondary | ICD-10-CM

## 2021-02-26 DIAGNOSIS — E109 Type 1 diabetes mellitus without complications: Secondary | ICD-10-CM

## 2021-02-26 DIAGNOSIS — K3184 Gastroparesis: Secondary | ICD-10-CM | POA: Diagnosis present

## 2021-02-26 DIAGNOSIS — Z87440 Personal history of urinary (tract) infections: Secondary | ICD-10-CM

## 2021-02-26 DIAGNOSIS — Z85841 Personal history of malignant neoplasm of brain: Secondary | ICD-10-CM | POA: Diagnosis not present

## 2021-02-26 DIAGNOSIS — I2510 Atherosclerotic heart disease of native coronary artery without angina pectoris: Secondary | ICD-10-CM

## 2021-02-26 DIAGNOSIS — I6930 Unspecified sequelae of cerebral infarction: Secondary | ICD-10-CM

## 2021-02-26 DIAGNOSIS — I10 Essential (primary) hypertension: Secondary | ICD-10-CM | POA: Diagnosis present

## 2021-02-26 DIAGNOSIS — I69320 Aphasia following cerebral infarction: Secondary | ICD-10-CM

## 2021-02-26 DIAGNOSIS — T17908A Unspecified foreign body in respiratory tract, part unspecified causing other injury, initial encounter: Secondary | ICD-10-CM

## 2021-02-26 DIAGNOSIS — E0590 Thyrotoxicosis, unspecified without thyrotoxic crisis or storm: Secondary | ICD-10-CM | POA: Diagnosis present

## 2021-02-26 DIAGNOSIS — G43909 Migraine, unspecified, not intractable, without status migrainosus: Secondary | ICD-10-CM | POA: Diagnosis not present

## 2021-02-26 DIAGNOSIS — Z7902 Long term (current) use of antithrombotics/antiplatelets: Secondary | ICD-10-CM

## 2021-02-26 DIAGNOSIS — F32A Depression, unspecified: Secondary | ICD-10-CM | POA: Diagnosis present

## 2021-02-26 DIAGNOSIS — Z794 Long term (current) use of insulin: Secondary | ICD-10-CM

## 2021-02-26 DIAGNOSIS — E119 Type 2 diabetes mellitus without complications: Secondary | ICD-10-CM

## 2021-02-26 LAB — GLUCOSE, CAPILLARY
Glucose-Capillary: 251 mg/dL — ABNORMAL HIGH (ref 70–99)
Glucose-Capillary: 224 mg/dL — ABNORMAL HIGH (ref 70–99)
Glucose-Capillary: 246 mg/dL — ABNORMAL HIGH (ref 70–99)
Glucose-Capillary: 212 mg/dL — ABNORMAL HIGH (ref 70–99)
Glucose-Capillary: 180 mg/dL — ABNORMAL HIGH (ref 70–99)

## 2021-02-26 LAB — BASIC METABOLIC PANEL
Anion gap: 7 (ref 5–15)
BUN: 18 mg/dL (ref 6–20)
CO2: 24 mmol/L (ref 22–32)
Calcium: 8.8 mg/dL — ABNORMAL LOW (ref 8.9–10.3)
Chloride: 100 mmol/L (ref 98–111)
Creatinine, Ser: 0.73 mg/dL (ref 0.44–1.00)
GFR, Estimated: >60 mL/min (ref >60)
Glucose, Bld: 247 mg/dL — ABNORMAL HIGH (ref 70–99)
Potassium: 4.4 mmol/L (ref 3.5–5.1)
Sodium: 131 mmol/L — ABNORMAL LOW (ref 135–145)

## 2021-02-26 LAB — CBC
HCT: 35.6 % — ABNORMAL LOW (ref 36.0–46.0)
Hemoglobin: 11.7 g/dL — ABNORMAL LOW (ref 12.0–15.0)
MCH: 27.3 pg (ref 26.0–34.0)
MCHC: 32.9 g/dL (ref 30.0–36.0)
MCV: 83.2 fL (ref 80.0–100.0)
Platelets: 502 10*3/uL — ABNORMAL HIGH (ref 150–400)
RBC: 4.28 MIL/uL (ref 3.87–5.11)
RDW: 16.2 % — ABNORMAL HIGH (ref 11.5–15.5)
WBC: 7.1 10*3/uL (ref 4.0–10.5)
nRBC: 0 % (ref 0.0–0.2)

## 2021-02-26 LAB — MAGNESIUM: Magnesium: 1.8 mg/dL (ref 1.7–2.4)

## 2021-02-26 MED ORDER — POLYETHYLENE GLYCOL 3350 17 G PO PACK
17.0000 g | PACK | Freq: Two times a day (BID) | ORAL | Status: DC
Start: 2021-02-26 — End: 2021-03-16
  Administered 2021-02-26 – 2021-03-13 (×7): 17 g via ORAL
  Filled 2021-02-26 (×32): qty 1

## 2021-02-26 MED ORDER — NYSTATIN 100000 UNIT/GM EX POWD
Freq: Three times a day (TID) | CUTANEOUS | Status: DC
  Filled 2021-02-26 (×2): qty 15

## 2021-02-26 MED ORDER — ATORVASTATIN CALCIUM 80 MG PO TABS
80.0000 mg | ORAL_TABLET | Freq: Every day | ORAL | Status: DC
  Administered 2021-02-27 – 2021-03-16 (×18): 80 mg via ORAL
  Filled 2021-02-26 (×18): qty 1

## 2021-02-26 MED ORDER — PROCHLORPERAZINE EDISYLATE 10 MG/2ML IJ SOLN: 5.0000 mg | Freq: Four times a day (QID) | INTRAMUSCULAR | Status: DC | PRN

## 2021-02-26 MED ORDER — ACETAMINOPHEN 325 MG PO TABS
325.0000 mg | ORAL_TABLET | ORAL | Status: DC | PRN
  Administered 2021-03-09 – 2021-03-14 (×4): 650 mg via ORAL
  Filled 2021-02-26 (×5): qty 2

## 2021-02-26 MED ORDER — NYSTATIN 100000 UNIT/GM EX POWD: Freq: Three times a day (TID) | CUTANEOUS | 0 refills | Status: DC

## 2021-02-26 MED ORDER — ACETAMINOPHEN 325 MG PO TABS: 650.0000 mg | ORAL_TABLET | Freq: Four times a day (QID) | ORAL | Status: DC | PRN

## 2021-02-26 MED ORDER — ONDANSETRON HCL 4 MG/2ML IJ SOLN: 4.0000 mg | Freq: Four times a day (QID) | INTRAMUSCULAR | 0 refills | Status: DC | PRN

## 2021-02-26 MED ORDER — INSULIN ASPART 100 UNIT/ML IJ SOLN
0.0000 [IU] | Freq: Three times a day (TID) | INTRAMUSCULAR | Status: DC
  Administered 2021-02-26: 5 [IU] via SUBCUTANEOUS
  Administered 2021-02-27: 17:00:00 3 [IU] via SUBCUTANEOUS
  Administered 2021-02-27: 12:00:00 8 [IU] via SUBCUTANEOUS
  Administered 2021-02-27: 08:00:00 3 [IU] via SUBCUTANEOUS
  Administered 2021-02-28: 13:00:00 5 [IU] via SUBCUTANEOUS
  Administered 2021-02-28 – 2021-03-01 (×2): 3 [IU] via SUBCUTANEOUS
  Administered 2021-03-01: 2 [IU] via SUBCUTANEOUS
  Administered 2021-03-01: 3 [IU] via SUBCUTANEOUS
  Administered 2021-03-02: 2 [IU] via SUBCUTANEOUS
  Administered 2021-03-02 – 2021-03-03 (×3): 3 [IU] via SUBCUTANEOUS
  Administered 2021-03-03: 5 [IU] via SUBCUTANEOUS
  Administered 2021-03-04: 3 [IU] via SUBCUTANEOUS
  Administered 2021-03-04: 5 [IU] via SUBCUTANEOUS
  Administered 2021-03-04 – 2021-03-05 (×4): 3 [IU] via SUBCUTANEOUS
  Administered 2021-03-06 – 2021-03-08 (×3): 2 [IU] via SUBCUTANEOUS
  Administered 2021-03-08: 3 [IU] via SUBCUTANEOUS
  Administered 2021-03-09 – 2021-03-10 (×3): 2 [IU] via SUBCUTANEOUS
  Administered 2021-03-10: 3 [IU] via SUBCUTANEOUS
  Administered 2021-03-10 – 2021-03-11 (×2): 2 [IU] via SUBCUTANEOUS
  Administered 2021-03-12: 3 [IU] via SUBCUTANEOUS
  Administered 2021-03-13: 2 [IU] via SUBCUTANEOUS
  Administered 2021-03-14 – 2021-03-15 (×2): 3 [IU] via SUBCUTANEOUS
  Administered 2021-03-16: 2 [IU] via SUBCUTANEOUS
  Administered 2021-03-16: 3 [IU] via SUBCUTANEOUS

## 2021-02-26 MED ORDER — INSULIN ASPART 100 UNIT/ML IJ SOLN
3.0000 [IU] | Freq: Three times a day (TID) | INTRAMUSCULAR | Status: DC
  Administered 2021-02-26 – 2021-03-01 (×6): 3 [IU] via SUBCUTANEOUS

## 2021-02-26 MED ORDER — PANTOPRAZOLE SODIUM 40 MG PO TBEC
40.0000 mg | DELAYED_RELEASE_TABLET | Freq: Every day | ORAL | Status: DC
  Administered 2021-02-27 – 2021-03-16 (×18): 40 mg via ORAL
  Filled 2021-02-26 (×18): qty 1

## 2021-02-26 MED ORDER — CYCLOBENZAPRINE HCL 5 MG PO TABS
5.0000 mg | ORAL_TABLET | Freq: Three times a day (TID) | ORAL | Status: DC | PRN
  Administered 2021-02-28 – 2021-03-15 (×15): 5 mg via ORAL
  Filled 2021-02-26 (×16): qty 1

## 2021-02-26 MED ORDER — LORATADINE 10 MG PO TABS
10.0000 mg | ORAL_TABLET | Freq: Every day | ORAL | Status: DC
  Administered 2021-02-27 – 2021-03-16 (×18): 10 mg via ORAL
  Filled 2021-02-26 (×18): qty 1

## 2021-02-26 MED ORDER — INSULIN ASPART 100 UNIT/ML IJ SOLN
0.0000 [IU] | Freq: Every day | INTRAMUSCULAR | Status: DC
  Administered 2021-02-27: 22:00:00 4 [IU] via SUBCUTANEOUS
  Administered 2021-02-28 – 2021-03-15 (×6): 2 [IU] via SUBCUTANEOUS

## 2021-02-26 MED ORDER — ALUM & MAG HYDROXIDE-SIMETH 200-200-20 MG/5ML PO SUSP: 30.0000 mL | ORAL | Status: DC | PRN

## 2021-02-26 MED ORDER — FLEET ENEMA 7-19 GM/118ML RE ENEM: 1.0000 | ENEMA | Freq: Once | RECTAL | Status: DC | PRN

## 2021-02-26 MED ORDER — PROCHLORPERAZINE MALEATE 5 MG PO TABS
5.0000 mg | ORAL_TABLET | Freq: Four times a day (QID) | ORAL | Status: DC | PRN
  Administered 2021-03-02: 5 mg via ORAL
  Administered 2021-03-05 – 2021-03-12 (×2): 10 mg via ORAL
  Filled 2021-02-26 (×2): qty 2
  Filled 2021-02-26: qty 1

## 2021-02-26 MED ORDER — POLYETHYLENE GLYCOL 3350 17 G PO PACK
17.0000 g | PACK | Freq: Every day | ORAL | Status: DC | PRN
  Filled 2021-02-26: qty 1

## 2021-02-26 MED ORDER — LISINOPRIL 20 MG PO TABS
20.0000 mg | ORAL_TABLET | Freq: Every day | ORAL | Status: DC
  Administered 2021-02-27 – 2021-03-16 (×18): 20 mg via ORAL
  Filled 2021-02-26 (×18): qty 1

## 2021-02-26 MED ORDER — SENNA 8.6 MG PO TABS
1.0000 | ORAL_TABLET | Freq: Every day | ORAL | Status: DC
Start: 2021-02-26 — End: 2021-03-16
  Administered 2021-02-26 – 2021-03-15 (×14): 8.6 mg via ORAL
  Filled 2021-02-26 (×19): qty 1

## 2021-02-26 MED ORDER — INSULIN ASPART 100 UNIT/ML IJ SOLN: 3.0000 [IU] | Freq: Three times a day (TID) | INTRAMUSCULAR | Status: DC

## 2021-02-26 MED ORDER — INSULIN ASPART 100 UNIT/ML IJ SOLN
0.0000 [IU] | Freq: Three times a day (TID) | INTRAMUSCULAR | Status: DC
Start: 2021-02-26 — End: 2021-03-16

## 2021-02-26 MED ORDER — ESCITALOPRAM OXALATE 10 MG PO TABS
20.0000 mg | ORAL_TABLET | Freq: Every day | ORAL | Status: DC
  Administered 2021-02-27 – 2021-03-16 (×18): 20 mg via ORAL
  Filled 2021-02-26 (×18): qty 2

## 2021-02-26 MED ORDER — METOCLOPRAMIDE HCL 5 MG PO TABS
10.0000 mg | ORAL_TABLET | Freq: Three times a day (TID) | ORAL | Status: DC
  Administered 2021-02-26 – 2021-03-16 (×54): 10 mg via ORAL
  Filled 2021-02-26 (×54): qty 2

## 2021-02-26 MED ORDER — CLOPIDOGREL BISULFATE 75 MG PO TABS
75.0000 mg | ORAL_TABLET | Freq: Every day | ORAL | Status: DC
  Administered 2021-02-27 – 2021-03-16 (×18): 75 mg via ORAL
  Filled 2021-02-26 (×18): qty 1

## 2021-02-26 MED ORDER — GABAPENTIN 100 MG PO CAPS
100.0000 mg | ORAL_CAPSULE | Freq: Three times a day (TID) | ORAL | Status: DC
  Administered 2021-02-26 – 2021-03-16 (×54): 100 mg via ORAL
  Filled 2021-02-26 (×55): qty 1

## 2021-02-26 MED ORDER — BISACODYL 10 MG RE SUPP
10.0000 mg | Freq: Every day | RECTAL | Status: DC | PRN
  Filled 2021-02-26: qty 1

## 2021-02-26 MED ORDER — TRAZODONE HCL 50 MG PO TABS: 25.0000 mg | ORAL_TABLET | Freq: Every evening | ORAL | Status: DC | PRN

## 2021-02-26 MED ORDER — LORAZEPAM 0.5 MG PO TABS
0.2500 mg | ORAL_TABLET | Freq: Every day | ORAL | Status: DC
  Administered 2021-02-26 – 2021-03-15 (×18): 0.25 mg via ORAL
  Filled 2021-02-26 (×18): qty 1

## 2021-02-26 MED ORDER — GLUCERNA SHAKE PO LIQD: 237.0000 mL | Freq: Three times a day (TID) | ORAL | 0 refills | Status: DC

## 2021-02-26 MED ORDER — DIPHENHYDRAMINE HCL 12.5 MG/5ML PO ELIX
12.5000 mg | ORAL_SOLUTION | Freq: Four times a day (QID) | ORAL | Status: DC | PRN
  Administered 2021-03-01 – 2021-03-03 (×2): 25 mg via ORAL
  Filled 2021-02-26 (×2): qty 10

## 2021-02-26 MED ORDER — ENOXAPARIN SODIUM 40 MG/0.4ML IJ SOSY: 40.0000 mg | PREFILLED_SYRINGE | INTRAMUSCULAR | Status: DC

## 2021-02-26 MED ORDER — INSULIN ASPART 100 UNIT/ML IJ SOLN: 0.0000 [IU] | Freq: Every day | INTRAMUSCULAR | Status: DC

## 2021-02-26 MED ORDER — ENOXAPARIN SODIUM 60 MG/0.6ML IJ SOSY: 50.0000 mg | PREFILLED_SYRINGE | INTRAMUSCULAR | Status: DC

## 2021-02-26 MED ORDER — LIP MEDEX EX OINT
TOPICAL_OINTMENT | CUTANEOUS | Status: DC | PRN
  Filled 2021-02-26: qty 7

## 2021-02-26 MED ORDER — PROCHLORPERAZINE 25 MG RE SUPP: 12.5000 mg | Freq: Four times a day (QID) | RECTAL | Status: DC | PRN

## 2021-02-26 MED ORDER — GUAIFENESIN-DM 100-10 MG/5ML PO SYRP: 5.0000 mL | ORAL_SOLUTION | Freq: Four times a day (QID) | ORAL | Status: DC | PRN

## 2021-02-26 MED ORDER — INSULIN GLARGINE-YFGN 100 UNIT/ML ~~LOC~~ SOLN
25.0000 [IU] | Freq: Every day | SUBCUTANEOUS | Status: DC
  Administered 2021-02-26 – 2021-03-02 (×5): 25 [IU] via SUBCUTANEOUS
  Filled 2021-02-26 (×6): qty 0.25

## 2021-02-26 MED ORDER — AMLODIPINE BESYLATE 5 MG PO TABS
5.0000 mg | ORAL_TABLET | Freq: Every day | ORAL | Status: DC
  Administered 2021-02-27 – 2021-03-16 (×18): 5 mg via ORAL
  Filled 2021-02-26 (×18): qty 1

## 2021-02-26 MED ORDER — ZOLPIDEM TARTRATE 5 MG PO TABS: 5.0000 mg | ORAL_TABLET | Freq: Every evening | ORAL | Status: DC | PRN

## 2021-02-26 MED ORDER — MELATONIN 3 MG PO TABS
9.0000 mg | ORAL_TABLET | Freq: Every day | ORAL | Status: DC
  Administered 2021-02-26 – 2021-03-15 (×18): 9 mg via ORAL
  Filled 2021-02-26 (×19): qty 3

## 2021-02-26 MED ORDER — METOPROLOL TARTRATE 50 MG PO TABS
100.0000 mg | ORAL_TABLET | Freq: Two times a day (BID) | ORAL | Status: DC
  Administered 2021-02-26 – 2021-03-16 (×36): 100 mg via ORAL
  Filled 2021-02-26 (×36): qty 2

## 2021-02-26 MED ORDER — METHIMAZOLE 5 MG PO TABS
5.0000 mg | ORAL_TABLET | Freq: Every day | ORAL | Status: DC
  Administered 2021-02-27 – 2021-03-16 (×18): 5 mg via ORAL
  Filled 2021-02-26 (×19): qty 1

## 2021-02-26 NOTE — Progress Notes (Signed)
Physical Therapy Treatment Patient Details Name: Lori [name] MRN: [id number] DOB: 1970/05/16 Today's Date: 02/26/2021   History of Present Illness 51yo female who presented on 1/16 from [place], found to have DKA and was tachycardic possibly due to beta blocker withdrawal. PMH CAD s/p CABG, depression, DM, HTN, CVA with hemiplegia and dysarthria, HLD    PT Comments    Pt assisted with performing sit to stands and transfers for technique and strengthening.  Pt eager for d/c to CIR today.   Recommendations for follow up therapy are one component of a multi-disciplinary discharge planning process, led by the attending physician.  Recommendations may be updated based on patient status, additional functional criteria and insurance authorization.  Follow Up Recommendations  Acute inpatient rehab (3hours/day)     Assistance Recommended at Discharge Frequent or constant Supervision/Assistance  Patient can return home with the following A lot of help with bathing/dressing/bathroom;Direct supervision/assist for medications management;A lot of help with walking and/or transfers;Direct supervision/assist for financial management;Assistance with cooking/housework;Assist for transportation;Help with stairs or ramp for entrance   Equipment Recommendations  None recommended by PT    Recommendations for Other Services       Precautions / Restrictions Precautions Precautions: Fall Precaution Comments: L hemi (UE>LE), PICC R UE     Mobility  Bed Mobility Overal bed mobility: Needs Assistance Bed Mobility: Supine to Sit, Sit to Supine     Supine to sit: Max assist, +2 for physical assistance Sit to supine: Mod assist, +2 for physical assistance   General bed mobility comments: pt initiates with reaching however requiring assist for trunk upright and scooting to EOB, utilized bed pad for positioning    Transfers Overall transfer level: Needs assistance Equipment used: 1 person  hand held assist Transfers: Sit to/from Stand, Bed to chair/wheelchair/BSC Sit to Stand: Mod assist   Step pivot transfers: Mod assist, +2 safety/equipment       General transfer comment: multimodal cues for technique, pt performed 3 sit to stands and then transferred to recliner and then back to bed    Ambulation/Gait                   Stairs             Wheelchair Mobility    Modified Rankin (Stroke Patients Only)       Balance         Postural control: Posterior lean Standing balance support: During functional activity, Single extremity supported Standing balance-Leahy Scale: Poor                              Cognition Arousal/Alertness: Awake/alert Behavior During Therapy: WFL for tasks assessed/performed Overall Cognitive Status: Within Functional Limits for tasks assessed                                 General Comments: motivated        Exercises      General Comments        Pertinent Vitals/Pain Pain Assessment Pain Assessment: Faces Faces Pain Scale: Hurts little more Pain Location: back Pain Descriptors / Indicators: Discomfort Pain Intervention(s): Repositioned, Monitored during session    Home Living                          Prior Function  PT Goals (current goals can now be found in the care plan section) Progress towards PT goals: Progressing toward goals    Frequency    Min 4X/week      PT Plan Current plan remains appropriate    Co-evaluation              AM-PAC PT "6 Clicks" Mobility   Outcome Measure  Help needed turning from your back to your side while in a flat bed without using bedrails?: A Lot Help needed moving from lying on your back to sitting on the side of a flat bed without using bedrails?: A Lot Help needed moving to and from a bed to a chair (including a wheelchair)?: A Lot Help needed standing up from a chair using your arms (e.g.,  wheelchair or bedside chair)?: A Lot Help needed to walk in hospital room?: Total Help needed climbing 3-5 steps with a railing? : Total 6 Click Score: 10    End of Session Equipment Utilized During Treatment: Gait belt Activity Tolerance: Patient tolerated treatment well Patient left: in bed;with call bell/phone within reach;with bed alarm set   PT Visit Diagnosis: Unsteadiness on feet (R26.81);Muscle weakness (generalized) (M62.81);Other abnormalities of gait and mobility (R26.89)     Time: 1010-1034 PT Time Calculation (min) (ACUTE ONLY): 24 min  Charges:  $Therapeutic Activity: 23-37 mins           Jannette Spanner PT, DPT Acute Rehabilitation Services Pager: [phone] Office: [place] 02/26/2021, 11:22 AM

## 2021-02-26 NOTE — H&P (Signed)
Physical Medicine and Rehabilitation Admission H&P        Chief Complaint  Patient presents with   Debility   Secondary to DKA/ recent gastroenteritis.       HPI: Lori [name] is a 51 year old RH-female with history of T2DM, HTN, multiple prior strokes-[most significant last Jan with aphasia and right sided symptoms and then left hemiplegia since 10/22. She was recently admitted to [facility] on 01/19/21 with inability to speak with BLE weakness and projectile vomiting. CTA head/neck showed atherosclerosis most severe in PCA with L-VA and BA stenosis and possible flow limiting stenosis in right carotid siphon. MRI brain showed showed multiple old infarcts with moderate volume loss and not acute changes. Dr. Quinn Axe felt patient with recrudescence of symptoms in setting off GI illness and recommended SBP goal 130-150 given severe intracranial stenosis. (Patient familiar to Dr. Quinn Axe from [facility]). Also reports of brain Ca resection in the past. She was also found to have tachycardia due to hyperthyroidism and was started on tapazole.    Her verbal output was improving but continued to be limited by Left hemiplegia with left field cut. She was discharged to [facility] for rehab but has had repeat admission for N/V and noted to be in DKA on most recent admission to [facility] on 02/19/21. She was treated with IV insulin, IVF and IV Cardizem for rate control.  DKA has resolved and insulin being titrated for better control. N/V has resolved with IV reglan. Therapy ongoing and patient showing good participation and eager to return to prior level of function. CIR recommended due to functional decline.       Review of Systems  Unable to perform ROS: Language  Constitutional:  Negative for fever.  HENT:  Negative for hearing loss and tinnitus.   Respiratory:  Negative for cough and shortness of breath.   Cardiovascular:  Negative for chest pain and leg swelling.  Gastrointestinal:  Positive for heartburn.  Negative for abdominal pain, constipation, nausea and vomiting.  Genitourinary:  Negative for dysuria and urgency.  Musculoskeletal:  Positive for back pain.  Neurological:  Positive for dizziness (occassionally with N/V), speech change and focal weakness. Negative for headaches.          Past Medical History:  Diagnosis Date   CAD (coronary artery disease)     Depression     Diabetes mellitus without complication ([place])     Hypertension     Pheochromocytoma     Pheochromocytoma     Stroke [facility])     Thyroid disease             Past Surgical History:  Procedure Laterality Date   CORONARY ARTERY BYPASS GRAFT       Right adrenal gland removal for pheochromocytoma Right             Family History  Problem Relation Age of Onset   Diabetes Mother     Diabetes Brother     Brain cancer Brother        Social History:  Separated from husband for 2 years. Was living in [place], [place] since stroke last Oct and moved to [place] a week PTA December admission to [facility]. She needed help with ADLs and was able to perform SPT to WC. 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 She reports that she has never smoked. She has never used smokeless tobacco. She reports that she does not currently use alcohol. She reports  that she does not use drugs.          Allergies  Allergen Reactions   Contrast Media [Iodinated Contrast Media] Swelling   Penicillins Swelling      Mouth swells up and eyes swollen shut   Sumatriptan              Medications Prior to Admission  Medication Sig Dispense Refill   amLODipine (NORVASC) 5 MG tablet Take 1 tablet (5 mg total) by mouth daily.       aspirin 81 MG chewable tablet Chew 1 tablet (81 mg total) by mouth daily.       atorvastatin (LIPITOR) 80 MG tablet Take 80 mg by mouth daily.       calcium carbonate (TUMS - DOSED IN MG ELEMENTAL CALCIUM) 500 MG chewable tablet Chew 1 tablet (200 mg of elemental calcium total) by mouth daily.        clopidogrel (PLAVIX) 75 MG tablet Take 75 mg by mouth daily.       cyclobenzaprine (FLEXERIL) 5 MG tablet Take 1 tablet (5 mg total) by mouth 3 (three) times daily as needed for muscle spasms. (Patient taking differently: Take 5 mg by mouth 3 (three) times daily.) 30 tablet 0   escitalopram (LEXAPRO) 20 MG tablet Take 20 mg by mouth daily.       gabapentin (NEURONTIN) 100 MG capsule Take 1 capsule (100 mg total) by mouth 3 (three) times daily. 90 capsule 0   insulin glargine (LANTUS) 100 UNIT/ML injection Inject 0.25 mLs (25 Units total) into the skin daily.       lisinopril (ZESTRIL) 20 MG tablet Take 1 tablet (20 mg total) by mouth daily.       loratadine (CLARITIN) 10 MG tablet Take 1 tablet (10 mg total) by mouth daily.       LORazepam (ATIVAN) 0.5 MG tablet Take 0.5 tablets (0.25 mg total) by mouth at bedtime. 5 tablet 0   melatonin 3 MG TABS tablet Take 9 mg by mouth at bedtime.       methimazole (TAPAZOLE) 5 MG tablet Take 1 tablet (5 mg total) by mouth daily.       metoCLOPramide (REGLAN) 10 MG tablet Take 1 tablet (10 mg total) by mouth 3 (three) times daily before meals.       metoprolol tartrate (LOPRESSOR) 100 MG tablet Take 1 tablet (100 mg total) by mouth 2 (two) times daily.       polyethylene glycol (MIRALAX / GLYCOLAX) 17 g packet Take 17 g by mouth 2 (two) times daily. 14 each 0   senna (SENOKOT) 8.6 MG TABS tablet Take 1 tablet (8.6 mg total) by mouth at bedtime. 120 tablet 0   zolpidem (AMBIEN) 5 MG tablet Take 1 tablet (5 mg total) by mouth at bedtime as needed for sleep. (Patient taking differently: Take 5 mg by mouth at bedtime.) 30 tablet 0   pantoprazole (PROTONIX) 40 MG tablet Take 1 tablet (40 mg total) by mouth daily. (Patient not taking: Reported on 02/20/2021)          Drug Regimen Review  Drug regimen was reviewed and remains appropriate with no significant issues identified   Home: Home Living Family/patient expects to be discharged to:: Inpatient rehab Living  Arrangements: Children Available Help at Discharge: Family, Available 24 hours/day Type of Home: Apartment Home Access: Stairs to enter [employer] of Steps: Pt lives on 3rd floor apartment with multiple flights to access. Home Layout: One level Bathroom Shower/Tub:  Walk-in shower Home Equipment: Rollator (4 wheels), Hand held shower head Additional Comments: Patient reported plan is to transfer to grandfathers house with 3 steps to enter with family support at time of d/c. patient reported that possiblity of ramp to enter was available if needed after CIR. patient is very motivated to transition to CIR at this time.  Lives With: Family   Functional History: Prior Function Prior Level of Function : Needs assist Physical Assist : Mobility (physical), ADLs (physical) Mobility (physical): Bed mobility, Transfers, Gait ADLs (physical): Bathing, Dressing, Toileting, IADLs ADLs Comments: pt recently at SNF, staff assisting with self care. prior to CVA patient was independent in ADLs with husband doing cooking.   Functional Status:  Mobility: Bed Mobility Overal bed mobility: Needs Assistance Bed Mobility: Supine to Sit, Sit to Supine Supine to sit: Max assist, +2 for physical assistance Sit to supine: Mod assist, +2 for physical assistance General bed mobility comments: pt initiates with reaching however requiring assist for trunk upright and scooting to EOB, utilized bed pad for positioning Transfers Overall transfer level: Needs assistance Equipment used: 1 person hand held assist Transfers: Sit to/from Stand, Bed to chair/wheelchair/BSC Sit to Stand: Mod assist Bed to/from chair/wheelchair/BSC transfer type:: Step pivot Stand pivot transfers: Mod assist Step pivot transfers: Mod assist, +2 safety/equipment General transfer comment: multimodal cues for technique, pt performed 3 sit to stands and then transferred to recliner and then back to bed Ambulation/Gait General Gait  Details: unable with +1 assist   ADL: ADL Overall ADL's : Needs assistance/impaired Eating/Feeding: Set up, Sitting Eating/Feeding Details (indicate cue type and reason): to take small sips. Grooming: Wash/dry hands, Wash/dry face, Sitting, Minimal assistance Upper Body Bathing: Sitting, Moderate assistance Lower Body Bathing: Total assistance, Bed level Upper Body Dressing : Maximal assistance, Sitting Lower Body Dressing: Bed level, Total assistance Toilet Transfer: Moderate assistance, Maximal assistance, Control and instrumentation engineer Details (indicate cue type and reason): simulated to bedside chair with attempts to stand pivot and some scooting as well. Toileting- Clothing Manipulation and Hygiene: Maximal assistance, Sit to/from stand, +2 for physical assistance Functional mobility during ADLs: +2 for physical assistance, +2 for safety/equipment General ADL Comments: .   Cognition: Cognition Overall Cognitive Status: Within Functional Limits for tasks assessed Orientation Level: Oriented X4 Cognition Arousal/Alertness: Awake/alert Behavior During Therapy: WFL for tasks assessed/performed Overall Cognitive Status: Within Functional Limits for tasks assessed General Comments: motivated     Blood pressure 95/72, pulse 98, temperature 98.3 F (36.8 C), temperature source Oral, resp. rate 18, height 5\' 2"  (1.575 m), weight 102.1 kg, SpO2 97 %. Physical Exam Vitals and nursing note reviewed.  Constitutional:      General: She is not in acute distress.    Appearance: She is obese.     Comments: Flat affect.   HENT:     Head: Normocephalic and atraumatic.     Nose: Nose normal.  Eyes:     Extraocular Movements: Extraocular movements intact.     Pupils: Pupils are equal, round, and reactive to light.  Cardiovascular:     Rate and Rhythm: Normal rate and regular rhythm.  Pulmonary:     Effort: Pulmonary effort is normal. No respiratory distress.     Breath sounds: No  wheezing.  Abdominal:     General: Bowel sounds are normal.     Palpations: Abdomen is soft.  Musculoskeletal:     Cervical back: Normal range of motion and neck supple.  Skin:    General: Skin is warm  and dry.  Neurological:     Mental Status: She is alert.     Comments: Dysconjugate gaze. Slow to initiate and process. Language is deliberate. Able to communicate thoughts.    Psychiatric:     Comments: Cooperative but flat  RUE and RLE grossly 4- to 4/5. LUE 3/5 at shoulder,elbow but limited d/t flexor contracture and hypertonicity at left wrist/fingers   RLE 4/5. LLE 3 to 3+/5.  Hyperreflexic on left.  Also with mild inattention to left.    Lab Results Last 48 Hours        Results for orders placed or performed during the hospital encounter of 02/19/21 (from the past 48 hour(s))  Glucose, capillary     Status: Abnormal    Collection Time: 02/24/21 12:19 PM  Result Value Ref Range    Glucose-Capillary 260 (H) 70 - 99 mg/dL      Comment: Glucose reference range applies only to samples taken after fasting for at least 8 hours.  Glucose, capillary     Status: Abnormal    Collection Time: 02/24/21  4:08 PM  Result Value Ref Range    Glucose-Capillary 153 (H) 70 - 99 mg/dL      Comment: Glucose reference range applies only to samples taken after fasting for at least 8 hours.  Glucose, capillary     Status: Abnormal    Collection Time: 02/24/21  8:37 PM  Result Value Ref Range    Glucose-Capillary 159 (H) 70 - 99 mg/dL      Comment: Glucose reference range applies only to samples taken after fasting for at least 8 hours.  Magnesium     Status: None    Collection Time: 02/25/21  5:42 AM  Result Value Ref Range    Magnesium 1.9 1.7 - 2.4 mg/dL      Comment: Performed at [facility], [place] [address], [place], [place] [address]  Basic metabolic panel     Status: Abnormal    Collection Time: 02/25/21  5:42 AM  Result Value Ref Range    Sodium 131 (L) 135 - 145 mmol/L     Potassium 4.6 3.5 - 5.1 mmol/L    Chloride 100 98 - 111 mmol/L    CO2 25 22 - 32 mmol/L    Glucose, Bld 248 (H) 70 - 99 mg/dL      Comment: Glucose reference range applies only to samples taken after fasting for at least 8 hours.    BUN 14 6 - 20 mg/dL    Creatinine, Ser 0.60 0.44 - 1.00 mg/dL    Calcium 8.6 (L) 8.9 - 10.3 mg/dL    GFR, Estimated >60 >60 mL/min      Comment: (NOTE) Calculated using the CKD-EPI Creatinine Equation (2021)      Anion gap 6 5 - 15      Comment: Performed at [facility], [place] [address], [place], [place] [address]  Glucose, capillary     Status: Abnormal    Collection Time: 02/25/21  8:05 AM  Result Value Ref Range    Glucose-Capillary 218 (H) 70 - 99 mg/dL      Comment: Glucose reference range applies only to samples taken after fasting for at least 8 hours.  Glucose, capillary     Status: Abnormal    Collection Time: 02/25/21 12:27 PM  Result Value Ref Range    Glucose-Capillary 266 (H) 70 - 99 mg/dL      Comment: Glucose reference range applies only  to samples taken after fasting for at least 8 hours.  Glucose, capillary     Status: Abnormal    Collection Time: 02/25/21  5:06 PM  Result Value Ref Range    Glucose-Capillary 142 (H) 70 - 99 mg/dL      Comment: Glucose reference range applies only to samples taken after fasting for at least 8 hours.  Glucose, capillary     Status: Abnormal    Collection Time: 02/25/21  9:31 PM  Result Value Ref Range    Glucose-Capillary 190 (H) 70 - 99 mg/dL      Comment: Glucose reference range applies only to samples taken after fasting for at least 8 hours.  Basic metabolic panel     Status: Abnormal    Collection Time: 02/26/21  6:32 AM  Result Value Ref Range    Sodium 131 (L) 135 - 145 mmol/L    Potassium 4.4 3.5 - 5.1 mmol/L    Chloride 100 98 - 111 mmol/L    CO2 24 22 - 32 mmol/L    Glucose, Bld 247 (H) 70 - 99 mg/dL      Comment: Glucose reference range applies only to samples  taken after fasting for at least 8 hours.    BUN 18 6 - 20 mg/dL    Creatinine, Ser 0.73 0.44 - 1.00 mg/dL    Calcium 8.8 (L) 8.9 - 10.3 mg/dL    GFR, Estimated >60 >60 mL/min      Comment: (NOTE) Calculated using the CKD-EPI Creatinine Equation (2021)      Anion gap 7 5 - 15      Comment: Performed at [facility], [place] [address], [place], [place] [address]  Magnesium     Status: None    Collection Time: 02/26/21  6:32 AM  Result Value Ref Range    Magnesium 1.8 1.7 - 2.4 mg/dL      Comment: Performed at [facility], [place] [address] [place], [place] [address]  CBC     Status: Abnormal    Collection Time: 02/26/21  6:32 AM  Result Value Ref Range    WBC 7.1 4.0 - 10.5 K/uL    RBC 4.28 3.87 - 5.11 MIL/uL    Hemoglobin 11.7 (L) 12.0 - 15.0 g/dL    HCT 35.6 (L) 36.0 - 46.0 %    MCV 83.2 80.0 - 100.0 fL    MCH 27.3 26.0 - 34.0 pg    MCHC 32.9 30.0 - 36.0 g/dL    RDW 16.2 (H) 11.5 - 15.5 %    Platelets 502 (H) 150 - 400 K/uL    nRBC 0.0 0.0 - 0.2 %      Comment: Performed at [facility], [place] [address], [place], [place] [address]  Glucose, capillary     Status: Abnormal    Collection Time: 02/26/21  7:39 AM  Result Value Ref Range    Glucose-Capillary 251 (H) 70 - 99 mg/dL      Comment: Glucose reference range applies only to samples taken after fasting for at least 8 hours.      Imaging Results (Last 48 hours)  No results found.           Medical Problem List and Plan: 1. Functional deficits secondary to debility related to DKA, in the setting of chronic spastic left hemiparesis and aphasia d/t prior CVA's             -patient may shower             -  ELOS/Goals: 14-16 days, min assist goals with PT, OT 2.  Antithrombotics: -DVT/anticoagulation:  Pharmaceutical: Lovenox             -antiplatelet therapy: Plavix.  3. Pain Management/spasticity: Tylenol prn.   -ROM with therapy  -consider botox, defer to primary  team 4. Mood: LCSW to follow for evaluation and support.              -antipsychotic agents: N/A 5. Neuropsych: This patient may be capable of making decisions on her own behalf.             -will need to investigate further with family input.  6. Skin/Wound Care:  Routine pressure relief measures.  7. Fluids/Electrolytes/Nutrition: Monitor I/O. Check CMET in am. 8. T2DM: Hgb A1c-9.0 poorly controlled. Has been on insulin for the past year.              --came in with DKA -->on Glucerna TID BETWEEN meals. Will change to lower carb Ensure max with juven for low protein stores.              --Continue Insulin gargline with  9.  Hyperthyroid: Recent diagnosis and now on Tapazole             --continue to monitor HR TID.  10. Gastroparesis?: Change reglan to po route.             --monitor for TDK 11. H/o anxiety/depression: Continue Lexapro with ativan at bedtime.             --melatonin for sleep wake disruption.  12. Hyponatremia: Monitor Sodium level with serial checks as trending down.              --Recheck CMET in am.   13. HTN: SBP goal 130-150 due to severe intracranial atherosclerosis.  --Monitor BP TID. Continue Zestril.      Bary Leriche, PA-C 02/26/2021   I have personally performed a face to face diagnostic evaluation of this patient and formulated the key components of the plan.  Additionally, I have personally reviewed laboratory data, imaging studies, as well as relevant notes and concur with the physician assistant's documentation above.  The patient's status has not changed from the original H&P.  Any changes in documentation from the acute care chart have been noted above.  Meredith Staggers, MD, Mell[facility] Drown

## 2021-02-26 NOTE — H&P (Signed)
Physical Medicine and Rehabilitation Admission H&P    Chief Complaint  Patient presents with   Debility   Secondary to DKA/ recent gastroenteritis.     HPI: Lori [name] is a 51 year old RH-female with history of T2DM, HTN, multiple prior strokes-[most significant last Jan with aphasia and right sided symptoms and then left hemiplegia since 10/22. She was recently admitted to [facility] on 01/19/21 with inability to speak with BLE weakness and projectile vomiting. CTA head/neck showed atherosclerosis most severe in PCA with L-VA and BA stenosis and possible flow limiting stenosis in right carotid siphon. MRI brain showed showed multiple old infarcts with moderate volume loss and not acute changes. Dr. Quinn Axe felt patient with recrudescence of symptoms in setting off GI illness and recommended SBP goal 130-150 given severe intracranial stenosis. (Patient familiar to Dr. Quinn Axe from [facility]). Also reports of brain Ca resection in the past. She was also found to have tachycardia due to hyperthyroidism and was started on tapazole.   Her verbal output was improving but continued to be limited by Left hemiplegia with left field cut. She was discharged to [facility] for rehab but has had repeat admission for N/V and noted to be in DKA on most recent admission to [facility] on 02/19/21. She was treated with IV insulin, IVF and IV Cardizem for rate control.  DKA has resolved and insulin being titrated for better control. N/V has resolved with IV reglan. Therapy ongoing and patient showing good participation and eager to return to prior level of function. CIR recommended due to functional decline.     Review of Systems  Unable to perform ROS: Language  Constitutional:  Negative for fever.  HENT:  Negative for hearing loss and tinnitus.   Respiratory:  Negative for cough and shortness of breath.   Cardiovascular:  Negative for chest pain and leg swelling.  Gastrointestinal:  Positive for heartburn. Negative for  abdominal pain, constipation, nausea and vomiting.  Genitourinary:  Negative for dysuria and urgency.  Musculoskeletal:  Positive for back pain.  Neurological:  Positive for dizziness (occassionally with N/V), speech change and focal weakness. Negative for headaches.    Past Medical History:  Diagnosis Date   CAD (coronary artery disease)    Depression    Diabetes mellitus without complication ([place])    Hypertension    Pheochromocytoma    Pheochromocytoma    Stroke [facility])    Thyroid disease     Past Surgical History:  Procedure Laterality Date   CORONARY ARTERY BYPASS GRAFT     Right adrenal gland removal for pheochromocytoma Right     Family History  Problem Relation Age of Onset   Diabetes Mother    Diabetes Brother    Brain cancer Brother     Social History:  Separated from husband for 2 years. Was living in [place], [place] since stroke last Oct and moved to [place] a week PTA December admission to [facility]. She needed help with ADLs and was able to perform SPT to WC. 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 She reports that she has never smoked. She has never used smokeless tobacco. She reports that she does not currently use alcohol. She reports that she does not use drugs.   Allergies  Allergen Reactions   Contrast Media [Iodinated Contrast Media] Swelling   Penicillins Swelling    Mouth swells up and eyes swollen shut   Sumatriptan     Medications Prior to Admission  Medication Sig Dispense Refill   amLODipine (NORVASC) 5 MG tablet Take 1 tablet (5 mg total) by mouth daily.     aspirin 81 MG chewable tablet Chew 1 tablet (81 mg total) by mouth daily.     atorvastatin (LIPITOR) 80 MG tablet Take 80 mg by mouth daily.     calcium carbonate (TUMS - DOSED IN MG ELEMENTAL CALCIUM) 500 MG chewable tablet Chew 1 tablet (200 mg of elemental calcium total) by mouth daily.     clopidogrel (PLAVIX) 75 MG tablet Take 75 mg by mouth daily.      cyclobenzaprine (FLEXERIL) 5 MG tablet Take 1 tablet (5 mg total) by mouth 3 (three) times daily as needed for muscle spasms. (Patient taking differently: Take 5 mg by mouth 3 (three) times daily.) 30 tablet 0   escitalopram (LEXAPRO) 20 MG tablet Take 20 mg by mouth daily.     gabapentin (NEURONTIN) 100 MG capsule Take 1 capsule (100 mg total) by mouth 3 (three) times daily. 90 capsule 0   insulin glargine (LANTUS) 100 UNIT/ML injection Inject 0.25 mLs (25 Units total) into the skin daily.     lisinopril (ZESTRIL) 20 MG tablet Take 1 tablet (20 mg total) by mouth daily.     loratadine (CLARITIN) 10 MG tablet Take 1 tablet (10 mg total) by mouth daily.     LORazepam (ATIVAN) 0.5 MG tablet Take 0.5 tablets (0.25 mg total) by mouth at bedtime. 5 tablet 0   melatonin 3 MG TABS tablet Take 9 mg by mouth at bedtime.     methimazole (TAPAZOLE) 5 MG tablet Take 1 tablet (5 mg total) by mouth daily.     metoCLOPramide (REGLAN) 10 MG tablet Take 1 tablet (10 mg total) by mouth 3 (three) times daily before meals.     metoprolol tartrate (LOPRESSOR) 100 MG tablet Take 1 tablet (100 mg total) by mouth 2 (two) times daily.     polyethylene glycol (MIRALAX / GLYCOLAX) 17 g packet Take 17 g by mouth 2 (two) times daily. 14 each 0   senna (SENOKOT) 8.6 MG TABS tablet Take 1 tablet (8.6 mg total) by mouth at bedtime. 120 tablet 0   zolpidem (AMBIEN) 5 MG tablet Take 1 tablet (5 mg total) by mouth at bedtime as needed for sleep. (Patient taking differently: Take 5 mg by mouth at bedtime.) 30 tablet 0   pantoprazole (PROTONIX) 40 MG tablet Take 1 tablet (40 mg total) by mouth daily. (Patient not taking: Reported on 02/20/2021)      Drug Regimen Review  Drug regimen was reviewed and remains appropriate with no significant issues identified  Home: Home Living Family/patient expects to be discharged to:: Inpatient rehab Living Arrangements: Children Available Help at Discharge: Family, Available 24 hours/day Type  of Home: Apartment Home Access: Stairs to enter [employer] of Steps: Pt lives on 3rd floor apartment with multiple flights to access. Home Layout: One level Bathroom Shower/Tub: Insurance claims handler: Rollator (4 wheels), Hand held shower head Additional Comments: Patient reported plan is to transfer to grandfathers house with 3 steps to enter with family support at time of d/c. patient reported that possiblity of ramp to enter was available if needed after CIR. patient is very motivated to transition to CIR at this time.  Lives With: Family   Functional History: Prior Function Prior Level of Function : Needs assist Physical Assist : Mobility (physical), ADLs (physical) Mobility (physical): Bed mobility, Transfers, Gait ADLs (physical): Bathing, Dressing, Toileting, IADLs  ADLs Comments: pt recently at SNF, staff assisting with self care. prior to CVA patient was independent in ADLs with husband doing cooking.  Functional Status:  Mobility: Bed Mobility Overal bed mobility: Needs Assistance Bed Mobility: Supine to Sit, Sit to Supine Supine to sit: Max assist, +2 for physical assistance Sit to supine: Mod assist, +2 for physical assistance General bed mobility comments: pt initiates with reaching however requiring assist for trunk upright and scooting to EOB, utilized bed pad for positioning Transfers Overall transfer level: Needs assistance Equipment used: 1 person hand held assist Transfers: Sit to/from Stand, Bed to chair/wheelchair/BSC Sit to Stand: Mod assist Bed to/from chair/wheelchair/BSC transfer type:: Step pivot Stand pivot transfers: Mod assist Step pivot transfers: Mod assist, +2 safety/equipment General transfer comment: multimodal cues for technique, pt performed 3 sit to stands and then transferred to recliner and then back to bed Ambulation/Gait General Gait Details: unable with +1 assist    ADL: ADL Overall ADL's : Needs  assistance/impaired Eating/Feeding: Set up, Sitting Eating/Feeding Details (indicate cue type and reason): to take small sips. Grooming: Wash/dry hands, Wash/dry face, Sitting, Minimal assistance Upper Body Bathing: Sitting, Moderate assistance Lower Body Bathing: Total assistance, Bed level Upper Body Dressing : Maximal assistance, Sitting Lower Body Dressing: Bed level, Total assistance Toilet Transfer: Moderate assistance, Maximal assistance, Control and instrumentation engineer Details (indicate cue type and reason): simulated to bedside chair with attempts to stand pivot and some scooting as well. Toileting- Clothing Manipulation and Hygiene: Maximal assistance, Sit to/from stand, +2 for physical assistance Functional mobility during ADLs: +2 for physical assistance, +2 for safety/equipment General ADL Comments: .  Cognition: Cognition Overall Cognitive Status: Within Functional Limits for tasks assessed Orientation Level: Oriented X4 Cognition Arousal/Alertness: Awake/alert Behavior During Therapy: WFL for tasks assessed/performed Overall Cognitive Status: Within Functional Limits for tasks assessed General Comments: motivated   Blood pressure 95/72, pulse 98, temperature 98.3 F (36.8 C), temperature source Oral, resp. rate 18, height 5\' 2"  (1.575 m), weight 102.1 kg, SpO2 97 %. Physical Exam Vitals and nursing note reviewed.  Constitutional:      General: She is not in acute distress.    Appearance: She is obese.     Comments: Flat affect.   HENT:     Head: Normocephalic and atraumatic.     Nose: Nose normal.  Eyes:     Extraocular Movements: Extraocular movements intact.     Pupils: Pupils are equal, round, and reactive to light.  Cardiovascular:     Rate and Rhythm: Normal rate and regular rhythm.  Pulmonary:     Effort: Pulmonary effort is normal. No respiratory distress.     Breath sounds: No wheezing.  Abdominal:     General: Bowel sounds are normal.     Palpations:  Abdomen is soft.  Musculoskeletal:     Cervical back: Normal range of motion and neck supple.  Skin:    General: Skin is warm and dry.  Neurological:     Mental Status: She is alert.     Comments: Dysconjugate gaze. Slow to initiate and process. Language is deliberate. Able to communicate thoughts.    Psychiatric:     Comments: Cooperative but flat  RUE and RLE grossly 4- to 4/5. LUE 3/5 at shoulder,elbow but limited d/t flexor contracture and hypertonicity at left wrist/fingers   RLE 4/5. LLE 3 to 3+/5.  Hyperreflexic on left.  Also with mild inattention to left.   Results for orders placed or performed during the hospital encounter of  02/19/21 (from the past 48 hour(s))  Glucose, capillary     Status: Abnormal   Collection Time: 02/24/21 12:19 PM  Result Value Ref Range   Glucose-Capillary 260 (H) 70 - 99 mg/dL    Comment: Glucose reference range applies only to samples taken after fasting for at least 8 hours.  Glucose, capillary     Status: Abnormal   Collection Time: 02/24/21  4:08 PM  Result Value Ref Range   Glucose-Capillary 153 (H) 70 - 99 mg/dL    Comment: Glucose reference range applies only to samples taken after fasting for at least 8 hours.  Glucose, capillary     Status: Abnormal   Collection Time: 02/24/21  8:37 PM  Result Value Ref Range   Glucose-Capillary 159 (H) 70 - 99 mg/dL    Comment: Glucose reference range applies only to samples taken after fasting for at least 8 hours.  Magnesium     Status: None   Collection Time: 02/25/21  5:42 AM  Result Value Ref Range   Magnesium 1.9 1.7 - 2.4 mg/dL    Comment: Performed at [facility], [place] [address] [place], [place] [address]  Basic metabolic panel     Status: Abnormal   Collection Time: 02/25/21  5:42 AM  Result Value Ref Range   Sodium 131 (L) 135 - 145 mmol/L   Potassium 4.6 3.5 - 5.1 mmol/L   Chloride 100 98 - 111 mmol/L   CO2 25 22 - 32 mmol/L   Glucose, Bld 248 (H) 70 - 99 mg/dL     Comment: Glucose reference range applies only to samples taken after fasting for at least 8 hours.   BUN 14 6 - 20 mg/dL   Creatinine, Ser 0.60 0.44 - 1.00 mg/dL   Calcium 8.6 (L) 8.9 - 10.3 mg/dL   GFR, Estimated >60 >60 mL/min    Comment: (NOTE) Calculated using the CKD-EPI Creatinine Equation (2021)    Anion gap 6 5 - 15    Comment: Performed at [facility], [place] [address], [place], [place] [address]  Glucose, capillary     Status: Abnormal   Collection Time: 02/25/21  8:05 AM  Result Value Ref Range   Glucose-Capillary 218 (H) 70 - 99 mg/dL    Comment: Glucose reference range applies only to samples taken after fasting for at least 8 hours.  Glucose, capillary     Status: Abnormal   Collection Time: 02/25/21 12:27 PM  Result Value Ref Range   Glucose-Capillary 266 (H) 70 - 99 mg/dL    Comment: Glucose reference range applies only to samples taken after fasting for at least 8 hours.  Glucose, capillary     Status: Abnormal   Collection Time: 02/25/21  5:06 PM  Result Value Ref Range   Glucose-Capillary 142 (H) 70 - 99 mg/dL    Comment: Glucose reference range applies only to samples taken after fasting for at least 8 hours.  Glucose, capillary     Status: Abnormal   Collection Time: 02/25/21  9:31 PM  Result Value Ref Range   Glucose-Capillary 190 (H) 70 - 99 mg/dL    Comment: Glucose reference range applies only to samples taken after fasting for at least 8 hours.  Basic metabolic panel     Status: Abnormal   Collection Time: 02/26/21  6:32 AM  Result Value Ref Range   Sodium 131 (L) 135 - 145 mmol/L   Potassium 4.4 3.5 - 5.1 mmol/L   Chloride  100 98 - 111 mmol/L   CO2 24 22 - 32 mmol/L   Glucose, Bld 247 (H) 70 - 99 mg/dL    Comment: Glucose reference range applies only to samples taken after fasting for at least 8 hours.   BUN 18 6 - 20 mg/dL   Creatinine, Ser 0.73 0.44 - 1.00 mg/dL   Calcium 8.8 (L) 8.9 - 10.3 mg/dL   GFR, Estimated >60 >60 mL/min     Comment: (NOTE) Calculated using the CKD-EPI Creatinine Equation (2021)    Anion gap 7 5 - 15    Comment: Performed at [facility], [place] [address] [place], [place] [address]  Magnesium     Status: None   Collection Time: 02/26/21  6:32 AM  Result Value Ref Range   Magnesium 1.8 1.7 - 2.4 mg/dL    Comment: Performed at [facility], [place] [address] [place], [place] [address]  CBC     Status: Abnormal   Collection Time: 02/26/21  6:32 AM  Result Value Ref Range   WBC 7.1 4.0 - 10.5 K/uL   RBC 4.28 3.87 - 5.11 MIL/uL   Hemoglobin 11.7 (L) 12.0 - 15.0 g/dL   HCT 35.6 (L) 36.0 - 46.0 %   MCV 83.2 80.0 - 100.0 fL   MCH 27.3 26.0 - 34.0 pg   MCHC 32.9 30.0 - 36.0 g/dL   RDW 16.2 (H) 11.5 - 15.5 %   Platelets 502 (H) 150 - 400 K/uL   nRBC 0.0 0.0 - 0.2 %    Comment: Performed at [facility], [place] [address] [place], [place] [address]  Glucose, capillary     Status: Abnormal   Collection Time: 02/26/21  7:39 AM  Result Value Ref Range   Glucose-Capillary 251 (H) 70 - 99 mg/dL    Comment: Glucose reference range applies only to samples taken after fasting for at least 8 hours.   No results found.     Medical Problem List and Plan: 1. Functional deficits secondary to debility related to DKA, in the setting of chronic spastic hemiparesis and aphasia d/t prior CVA's  -patient may shower  -ELOS/Goals: 14-16 days, min assist goals with PT, OT 2.  Antithrombotics: -DVT/anticoagulation:  Pharmaceutical: Lovenox  -antiplatelet therapy: Plavix.  3. Pain Management: Tylenol prn.  4. Mood: LCSW to follow for evaluation and support.   -antipsychotic agents: N/A 5. Neuropsych: This patient may be capable of making decisions on her own behalf.  -will need to investigate further with family input.  6. Skin/Wound Care:  Routine pressure relief measures.  7. Fluids/Electrolytes/Nutrition: Monitor I/O. Check CMET in am. 8. T2DM: Hgb  A1c-9.0 poorly controlled. Has been on insulin for the past year.   --came in with DKA -->on Glucerna TID BETWEEN meals. Will change to lower carb Ensure max with juven for low protein stores.   --Continue Insulin gargline with  9.  Hyperthyroid: Recent diagnosis and now on Tapazole  --continue to monitor HR TID.  10. Gastroparesis?: Change reglan to po route.  --monitor for TDK 11. H/o anxiety/depression: Continue Lexapro with ativan at bedtime.  --melatonin for sleep wake disruption.  12. Hyponatremia: Monitor Sodium level with serial checks as trending down.   --Recheck CMET in am.   13. HTN: SBP goal 130-150 due to severe intracranial atherosclerosis.  --Monitor BP TID. Continue Zestril.         Bary Leriche, PA-C 02/26/2021

## 2021-02-26 NOTE — Discharge Summary (Signed)
Physician Discharge Summary  Lori [name] [facility] BSW:[id number] DOB: 1971/01/02 DOA: 02/19/2021  PCP: Pcp, No  Admit date: 02/19/2021 Discharge date: 02/26/2021  Admitted From: Skilled nursing facility  Discharge disposition: CIR  Recommendations for Outpatient Follow-Up:   Follow up with your primary care provider in one week.  Check CBC, BMP, magnesium in the next visit   Discharge Diagnosis:   Principal Problem:   DKA (diabetic ketoacidosis) ([place]) Active Problems:   Tachycardia   Hyperthyroidism   Essential hypertension   CAD (coronary artery disease)   HLD (hyperlipidemia)   CVA (cerebral vascular accident) [facility])   Discharge Condition: Improved.  Diet recommendation:  Carbohydrate-modified.    Wound care: None.  Code status: Full.   History of Present Illness:   Lori [name] is a 51 y.o. female with past medical history significant of CAD, depression, type II DM, hypertension, pheochromocytoma, bedbound status due to history of CVA with upper extremity contracture bilateral lower extremity weakness, dysarthria presented to the hospital from [facility] with multiple episodes of vomiting.  It was informed that that she did not receive her medication including insulin at the skilled nursing facility.   Initial CBC showed WBC at 11.8 with 82% neutrophils.  CT head scan was negative for acute findings but multiple remote lacunar infarcts. In the ED, her initial vitals showed pressure of 99.2, pulse of 152.  Patient was started on insulin drip Cardizem was given and patient received IV fluid bolus.  Patient was then admitted hospital for further evaluation and treatment.   Hospital Course:   Following conditions were addressed during hospitalization as listed below,  Diabetic Ketoacidosis -Resolved.  On sliding scale insulin, long-acting and mealtime insulin.  Improved at this time.   Nausea, vomiting: Resolved Continue Phenergan, Reglan and Zofran.  Likely  diabetic gastroparesis.  Patient takes her Reglan 3 times daily before meals.  This will be continued on discharge   Mild hypokalemia.  Replenished.  Potassium of 4.4   Hypomagnesemia: Replenished.  Latest magnesium 1.8.   Hyperthyroidism Continue methimazole.   Essential hypertension Continue metoprolol, lisinopril and amlodipine.    Diabetic neuropathy: Continue gabapentin, Flexeril   CAD (coronary artery disease): No acute issues.  Continue Plavix, metoprolol and statin    Hyperlipidemia: Continue statin.   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  Patient will be discharged to CIR   History of depression/anxiety: Continue Lexapro, Ativan   GERD: Continue PPI   Anemia of chronic disease: monitor.  Stable at this time.   Morbid obesity with BMI 41: Would benefit from weight loss if possible.  Disposition.  At this time, patient is stable for disposition to CIR.  Medical Consultants:   None  Procedures:    None Subjective:   Today, patient was seen and examined at bedside.  Patient denies any nausea vomiting, fever chills or abdominal pain.  Wondering when she can go to rehab.  Discharge Exam:   Vitals:   02/25/21 2140 02/26/21 0607  BP: (!) 122/97 95/72  Pulse: (!) 103 98  Resp: 18 18  Temp: 98 F (36.7 C) 98.3 F (36.8 C)  SpO2: 99% 97%   Vitals:   02/25/21 1458 02/25/21 1903 02/25/21 2140 02/26/21 0607  BP: 120/84 119/85 (!) 122/97 95/72  Pulse: 90 95 (!) 103 98  Resp: 18 18 18 18   Temp: 99 F (37.2 C) 98 F (36.7 C) 98 F (36.7 C) 98.3 F (36.8 C)  TempSrc: Oral Oral Axillary Oral  SpO2: 99% 99% 99% 97%  Weight:      Height:       General: Obese built, not in obvious distress, deconditioned HENT:   No scleral pallor or icterus noted. Oral mucosa is moist.  Chest:  Clear breath sounds.  Diminished breath sounds bilaterally. No crackles or wheezes.  CVS: S1 &S2 heard. No murmur.  Regular rate  and rhythm. Abdomen: Soft, nontender, BS nondistended.  Bowel sounds are heard.   Extremities: No cyanosis, clubbing or edema.  Peripheral pulses are palpable. Psych: Alert, awake and oriented, answering appropriately, left-sided weakness, contractures in both hands, dysarthric CNS: Left-sided weakness, contractures in both hands, dysarthric. Skin: Warm and dry.  No rashes noted.  The results of significant diagnostics from this hospitalization (including imaging, microbiology, ancillary and laboratory) are listed below for reference.     Diagnostic Studies:   CT Head Wo Contrast  Result Date: 02/19/2021 CLINICAL DATA:  Facial trauma EXAM: CT HEAD WITHOUT CONTRAST CT MAXILLOFACIAL WITHOUT CONTRAST TECHNIQUE: Multidetector CT imaging of the head and maxillofacial structures were performed using the standard protocol without intravenous contrast. Multiplanar CT image reconstructions of the maxillofacial structures were also generated. RADIATION DOSE REDUCTION: This exam was performed according to the departmental dose-optimization program which includes automated exposure control, adjustment of the mA and/or kV according to patient size and/or use of iterative reconstruction technique. COMPARISON:  CT head 01/24/2021, MR head 01/20/2021 FINDINGS: CT HEAD FINDINGS Brain: There is no evidence of acute intracranial hemorrhage, extra-axial fluid collection, or acute infarct. A remote infarct in the left cerebellar hemisphere is unchanged. A remote cortical infarct in the left frontal lobe is unchanged. Multiple additional remote lacunar infarcts in the bilateral basal ganglia are also again seen. The background of parenchymal volume loss and chronic white matter microangiopathy is not significantly changed. There is no mass lesion. There is no midline shift. Vascular: There is calcification of the bilateral cavernous ICAs and vertebral arteries. Skull: Normal. Negative for fracture or focal lesion. Other:  None. CT MAXILLOFACIAL FINDINGS Osseous: No fracture or mandibular dislocation. No destructive process. Orbits: The globes and orbits are unremarkable. Sinuses: The paranasal sinuses are clear. The mastoid air cells are clear. Soft tissues: Unremarkable. IMPRESSION: 1. No acute intracranial pathology. 2. No acute facial bone fracture. 3. Multiple remote infarcts as above, unchanged. Electronically Signed   By: Valetta Mole M.D.   On: 02/19/2021 13:25   [place] EKG SITE RITE  Result Date: 02/19/2021 If Site Rite image not attached, placement could not be confirmed due to current cardiac rhythm.  CT Maxillofacial Wo Contrast  Result Date: 02/19/2021 CLINICAL DATA:  Facial trauma EXAM: CT HEAD WITHOUT CONTRAST CT MAXILLOFACIAL WITHOUT CONTRAST TECHNIQUE: Multidetector CT imaging of the head and maxillofacial structures were performed using the standard protocol without intravenous contrast. Multiplanar CT image reconstructions of the maxillofacial structures were also generated. RADIATION DOSE REDUCTION: This exam was performed according to the departmental dose-optimization program which includes automated exposure control, adjustment of the mA and/or kV according to patient size and/or use of iterative reconstruction technique. COMPARISON:  CT head 01/24/2021, MR head 01/20/2021 FINDINGS: CT HEAD FINDINGS Brain: There is no evidence of acute intracranial hemorrhage, extra-axial fluid collection, or acute infarct. A remote infarct in the left cerebellar hemisphere is unchanged. A remote cortical infarct in the left frontal lobe is unchanged. Multiple additional remote lacunar infarcts in the bilateral basal ganglia are also again seen. The background of parenchymal volume loss and chronic white matter microangiopathy is not  significantly changed. There is no mass lesion. There is no midline shift. Vascular: There is calcification of the bilateral cavernous ICAs and vertebral arteries. Skull: Normal. Negative for  fracture or focal lesion. Other: None. CT MAXILLOFACIAL FINDINGS Osseous: No fracture or mandibular dislocation. No destructive process. Orbits: The globes and orbits are unremarkable. Sinuses: The paranasal sinuses are clear. The mastoid air cells are clear. Soft tissues: Unremarkable. IMPRESSION: 1. No acute intracranial pathology. 2. No acute facial bone fracture. 3. Multiple remote infarcts as above, unchanged. Electronically Signed   By: Valetta Mole M.D.   On: 02/19/2021 13:25     Labs:   Basic Metabolic Panel: Recent Labs  Lab 02/21/21 0407 02/22/21 0306 02/23/21 0431 02/24/21 0943 02/25/21 0542 02/26/21 0632  NA 134* 135 132* 130* 131* 131*  K 3.5 3.4* 4.0 4.5 4.6 4.4  CL 99 99 99 99 100 100  CO2 27 28 27 24 25 24   GLUCOSE 215* 125* 158* 255* 248* 247*  BUN 7 16 14 9 14 18   CREATININE 0.59 0.90 0.65 0.55 0.60 0.73  CALCIUM 8.6* 8.7* 8.2* 8.3* 8.6* 8.8*  MG 1.7 2.1 2.0 1.6* 1.9 1.8  PHOS 4.3  --   --   --   --   --    GFR Estimated Creatinine Clearance: 96.3 mL/min (by C-G formula based on SCr of 0.73 mg/dL). Liver Function Tests: Recent Labs  Lab 02/19/21 1044 02/21/21 0407 02/22/21 0306  AST 15 12* 15  ALT 23 18 18   ALKPHOS 94 72 67  BILITOT 0.9 0.6 0.4  PROT 8.4* 7.0 6.5  ALBUMIN 3.9 3.3* 3.1*   Recent Labs  Lab 02/19/21 1044  LIPASE 27   No results for input(s): AMMONIA in the last 168 hours. Coagulation profile No results for input(s): INR, PROTIME in the last 168 hours.  CBC: Recent Labs  Lab 02/19/21 1044 02/21/21 0407 02/22/21 0306 02/23/21 0431 02/26/21 0632  WBC 11.8* 11.1* 9.1 8.3 7.1  NEUTROABS 9.6*  --   --   --   --   HGB 13.1 11.9* 11.4* 10.7* 11.7*  HCT 39.7 36.8 34.7* 33.1* 35.6*  MCV 82.4 83.6 83.0 83.2 83.2  PLT 581* 547* 492* 459* 502*   Cardiac Enzymes: No results for input(s): CKTOTAL, CKMB, CKMBINDEX, TROPONINI in the last 168 hours. BNP: Invalid input(s): POCBNP CBG: Recent Labs  Lab 02/25/21 0805 02/25/21 1227  02/25/21 1706 02/25/21 2131 02/26/21 0739  GLUCAP 218* 266* 142* 190* 251*   D-Dimer No results for input(s): DDIMER in the last 72 hours. Hgb A1c No results for input(s): HGBA1C in the last 72 hours. Lipid Profile No results for input(s): CHOL, HDL, LDLCALC, TRIG, CHOLHDL, LDLDIRECT in the last 72 hours. Thyroid function studies No results for input(s): TSH, T4TOTAL, T3FREE, THYROIDAB in the last 72 hours.  Invalid input(s): FREET3 Anemia work up No results for input(s): VITAMINB12, FOLATE, FERRITIN, TIBC, IRON, RETICCTPCT in the last 72 hours. Microbiology Recent Results (from the past 240 hour(s))  Resp Panel by RT-PCR (Flu A&B, Covid) Nasopharyngeal Swab     Status: None   Collection Time: 02/19/21  2:39 PM   Specimen: Nasopharyngeal Swab; Nasopharyngeal(NP) swabs in vial transport medium  Result Value Ref Range Status   SARS Coronavirus 2 by RT PCR NEGATIVE NEGATIVE Final    Comment: (NOTE) SARS-CoV-2 target nucleic acids are NOT DETECTED.  The SARS-CoV-2 RNA is generally detectable in upper respiratory specimens during the acute phase of infection. The lowest concentration of SARS-CoV-2 viral copies  this assay can detect is 138 copies/mL. A negative result does not preclude SARS-Cov-2 infection and should not be used as the sole basis for treatment or other patient management decisions. A negative result may occur with  improper specimen collection/handling, submission of specimen other than nasopharyngeal swab, presence of viral mutation(s) within the areas targeted by this assay, and inadequate number of viral copies(<138 copies/mL). A negative result must be combined with clinical observations, patient history, and epidemiological information. The expected result is Negative.  Fact Sheet for Patients:  EntrepreneurPulse.com.au  Fact Sheet for Healthcare Providers:  IncredibleEmployment.be  This test is no t yet approved or  cleared by the [place] FDA and  has been authorized for detection and/or diagnosis of SARS-CoV-2 by FDA under an Emergency Use Authorization (EUA). This EUA will remain  in effect (meaning this test can be used) for the duration of the COVID-19 declaration under Section 564(b)(1) of the Act, 21 U.S.C.section 360bbb-3(b)(1), unless the authorization is terminated  or revoked sooner.       Influenza A by PCR NEGATIVE NEGATIVE Final   Influenza B by PCR NEGATIVE NEGATIVE Final    Comment: (NOTE) The Xpert Xpress SARS-CoV-2/FLU/RSV plus assay is intended as an aid in the diagnosis of influenza from Nasopharyngeal swab specimens and should not be used as a sole basis for treatment. Nasal washings and aspirates are unacceptable for Xpert Xpress SARS-CoV-2/FLU/RSV testing.  Fact Sheet for Patients: EntrepreneurPulse.com.au  Fact Sheet for Healthcare Providers: IncredibleEmployment.be  This test is not yet approved or cleared by the [place] FDA and has been authorized for detection and/or diagnosis of SARS-CoV-2 by FDA under an Emergency Use Authorization (EUA). This EUA will remain in effect (meaning this test can be used) for the duration of the COVID-19 declaration under Section 564(b)(1) of the Act, 21 U.S.C. section 360bbb-3(b)(1), unless the authorization is terminated or revoked.  Performed at [facility], [place] [address] [place], [place] [address]   MRSA Next Gen by PCR, Nasal     Status: None   Collection Time: 02/19/21  4:52 PM   Specimen: Nasal Mucosa; Nasal Swab  Result Value Ref Range Status   MRSA by PCR Next Gen NOT DETECTED NOT DETECTED Final    Comment: (NOTE) The GeneXpert MRSA Assay (FDA approved for NASAL specimens only), is one component of a comprehensive MRSA colonization surveillance program. It is not intended to diagnose MRSA infection nor to guide or monitor treatment for MRSA  infections. Test performance is not FDA approved in patients less than 32 years old. Performed at [facility], [place] [address], [place], [place] [address]      Discharge Instructions:   Discharge Instructions     Call MD for:  persistant nausea and vomiting   Complete by: As directed    Call MD for:  severe uncontrolled pain   Complete by: As directed    Diet - low sodium heart healthy   Complete by: As directed    Diet Carb Modified   Complete by: As directed    Discharge instructions   Complete by: As directed    Follow-up with your primary care physician as outpatient after discharge from inpatient rehabilitation.   Increase activity slowly   Complete by: As directed       Allergies as of 02/26/2021       Reactions   Contrast Media [iodinated Contrast Media] Swelling   Penicillins Swelling   Mouth swells up and eyes swollen  shut   Sumatriptan         Medication List     TAKE these medications    acetaminophen 325 MG tablet Commonly known as: TYLENOL Take 2 tablets (650 mg total) by mouth every 6 (six) hours as needed for mild pain or headache.   amLODipine 5 MG tablet Commonly known as: NORVASC Take 1 tablet (5 mg total) by mouth daily.   aspirin 81 MG chewable tablet Chew 1 tablet (81 mg total) by mouth daily.   atorvastatin 80 MG tablet Commonly known as: LIPITOR Take 80 mg by mouth daily.   calcium carbonate 500 MG chewable tablet Commonly known as: TUMS - dosed in mg elemental calcium Chew 1 tablet (200 mg of elemental calcium total) by mouth daily.   clopidogrel 75 MG tablet Commonly known as: PLAVIX Take 75 mg by mouth daily.   cyclobenzaprine 5 MG tablet Commonly known as: FLEXERIL Take 1 tablet (5 mg total) by mouth 3 (three) times daily as needed for muscle spasms. What changed: when to take this   escitalopram 20 MG tablet Commonly known as: LEXAPRO Take 20 mg by mouth daily.   feeding supplement (GLUCERNA  SHAKE) Liqd Take 237 mLs by mouth 3 (three) times daily between meals.   gabapentin 100 MG capsule Commonly known as: NEURONTIN Take 1 capsule (100 mg total) by mouth 3 (three) times daily.   insulin aspart 100 UNIT/ML injection Commonly known as: novoLOG Inject 0-15 Units into the skin 3 (three) times daily with meals.   insulin aspart 100 UNIT/ML injection Commonly known as: novoLOG Inject 3 Units into the skin 3 (three) times daily with meals.   insulin aspart 100 UNIT/ML injection Commonly known as: novoLOG Inject 0-5 Units into the skin at bedtime.   insulin glargine 100 UNIT/ML injection Commonly known as: LANTUS Inject 0.25 mLs (25 Units total) into the skin daily.   lisinopril 20 MG tablet Commonly known as: ZESTRIL Take 1 tablet (20 mg total) by mouth daily.   loratadine 10 MG tablet Commonly known as: CLARITIN Take 1 tablet (10 mg total) by mouth daily.   LORazepam 0.5 MG tablet Commonly known as: ATIVAN Take 0.5 tablets (0.25 mg total) by mouth at bedtime.   melatonin 3 MG Tabs tablet Take 9 mg by mouth at bedtime.   methimazole 5 MG tablet Commonly known as: TAPAZOLE Take 1 tablet (5 mg total) by mouth daily.   metoCLOPramide 10 MG tablet Commonly known as: REGLAN Take 1 tablet (10 mg total) by mouth 3 (three) times daily before meals.   metoprolol tartrate 100 MG tablet Commonly known as: LOPRESSOR Take 1 tablet (100 mg total) by mouth 2 (two) times daily.   nystatin powder Commonly known as: MYCOSTATIN/NYSTOP Apply topically 3 (three) times daily. To affected areas   ondansetron 4 MG/2ML Soln injection Commonly known as: ZOFRAN Inject 2 mLs (4 mg total) into the vein every 6 (six) hours as needed for nausea or vomiting.   pantoprazole 40 MG tablet Commonly known as: PROTONIX Take 1 tablet (40 mg total) by mouth daily.   polyethylene glycol 17 g packet Commonly known as: MIRALAX / GLYCOLAX Take 17 g by mouth 2 (two) times daily.   senna  8.6 MG Tabs tablet Commonly known as: SENOKOT Take 1 tablet (8.6 mg total) by mouth at bedtime.   zolpidem 5 MG tablet Commonly known as: AMBIEN Take 1 tablet (5 mg total) by mouth at bedtime as needed for sleep. What changed: when to  take this          Time coordinating discharge: 39 minutes  Signed:  Nasirah Sachs  [facility] 02/26/2021, 10:42 AM

## 2021-02-26 NOTE — Progress Notes (Signed)
Patient arrived on unit, oriented to unit. Reviewed medications, therapy schedule, rehab routine and plan of care. States an understanding of information reviewed. No complications noted at this time. Patient reports no pain and is AX2-3 [employer]

## 2021-02-26 NOTE — Progress Notes (Signed)
PMR Admission Coordinator Pre-Admission Assessment   Patient: Lori [name] is an 51 y.o., female MRN: [id number] DOB: 11-18-1970 Height: 5' 2"  (157.5 cm) Weight: 102.1 kg   Insurance Information HMO:     PPO:      PCP:      IPA:      80/20: Yes     OTHER:  PRIMARY: Medicare part A       Policy#: [id number]      Subscriber:  Phone#: Verified online    Fax#:  Pre-Cert#:       Employer:  Benefits:  Phone #:      Name:  Eff. Date: Parts A ad B effective  02/04/2014 Deduct: $1600      Out of Pocket Max:  None      Life Max: N/A  CIR: 100%      SNF: 100 days Outpatient: 80%     Co-Pay: 20% Home Health: 100%      Co-Pay: none DME: 80%     Co-Pay: 20% Providers: patient's choice  SECONDARY: [place] medicaid Healthy Blue       Policy#: [id number]     Phone#:    Financial Counselor:       Phone#:    The Actuary for patients in Inpatient Rehabilitation Facilities with attached Privacy Act [place] Records was provided and verbally reviewed with: Patient   Emergency Contact Information Contact Information       Name Relation Home Work Mobile    [name],[name] Daughter [phone]   [phone]    Jonette Mate     [phone]           Current Medical History  Patient Admitting Diagnosis: DKA, Debility History of Present Illness: Lori [name] is a 51 y.o. 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  She was discharged  there on the previous Friday and stated that she did not receive her insulin or  her prescribed medications.  She also stated that she fell face down and has a small abrasion on her lip. nitial vital signs were temperature 99.2 F, pulse 152, respiration 16, BP  162/102 mmHg O2 sat 97% on room air.  The patient was started on an insulin infusion, received 10 mg of diltiazem 10 IVP followed by continuous infusion and 2000 mL of bolus. In the ED, Her CBC is her white count 11.8 with 82% neutrophils, hemoglobin 13.1 g/dL platelets 581.  BHA was 2.72 mmol/L.  CMP showed a chloride of 94 mmol/L, with an anion gap of 16, glucose 383 mg/dL total protein 8.4 g/dL.  Lipase was normal.CT head without contrast no acute intracranial pathology.  Multiple remote lacunar infarcts.  CT maxillofacial no acute facial bone fractures.  Please see images and full radiology report for further details. Pt. Admitted for diabetic ketoacidosis. Pt. Seen by PT and OT who recommended CIR to assist return to PLOF.    Patient's medical record from [facility]  has been reviewed by the rehabilitation admission coordinator and physician.   Past Medical History      Past Medical History:  Diagnosis Date   CAD (coronary artery disease)     Depression     Diabetes mellitus without complication ([place])     Hypertension     Pheochromocytoma  Pheochromocytoma     Stroke [facility])     Thyroid disease        Has the patient had major surgery during 100 days prior to admission? No   Family History   family history includes Brain cancer in her brother; Diabetes in her brother and mother.   Current Medications   Current Facility-Administered Medications:    0.9 %  sodium chloride infusion, 250 mL, Intravenous, PRN, Kathryne Eriksson, NP   acetaminophen (TYLENOL) tablet 650 mg, 650 mg, Oral, Q6H PRN, Pokhrel, Laxman, MD, 650 mg at 02/21/21 1558   amLODipine (NORVASC) tablet 5 mg, 5 mg, Oral, Daily, Pokhrel, Laxman, MD, 5 mg at 02/26/21 0943   atorvastatin (LIPITOR) tablet 80 mg, 80 mg, Oral, Daily, Pahwani, Rinka R, MD, 80 mg at 02/26/21 0944   Chlorhexidine Gluconate Cloth 2 % PADS 6 each, 6 each, Topical, Daily, Reubin Milan, MD, 6 each at 02/26/21 0947   clopidogrel  (PLAVIX) tablet 75 mg, 75 mg, Oral, Daily, Pokhrel, Laxman, MD, 75 mg at 02/26/21 0944   cyclobenzaprine (FLEXERIL) tablet 5 mg, 5 mg, Oral, TID PRN, Pokhrel, Laxman, MD, 5 mg at 02/26/21 0310   dextrose 50 % solution 0-50 mL, 0-50 mL, Intravenous, PRN, Reubin Milan, MD   enoxaparin (LOVENOX) injection 50 mg, 50 mg, Subcutaneous, Q24H, Pahwani, Rinka R, MD, 50 mg at 02/25/21 1418   escitalopram (LEXAPRO) tablet 20 mg, 20 mg, Oral, Daily, Pokhrel, Laxman, MD, 20 mg at 02/26/21 0943   feeding supplement (GLUCERNA SHAKE) (GLUCERNA SHAKE) liquid 237 mL, 237 mL, Oral, TID BM, Pokhrel, Laxman, MD, 237 mL at 02/25/21 2005   gabapentin (NEURONTIN) capsule 100 mg, 100 mg, Oral, TID, Pokhrel, Laxman, MD, 100 mg at 02/26/21 0943   insulin aspart (novoLOG) injection 0-15 Units, 0-15 Units, Subcutaneous, TID WC, Kathryne Eriksson, NP, 8 Units at 02/26/21 0745   insulin aspart (novoLOG) injection 0-5 Units, 0-5 Units, Subcutaneous, QHS, Kathryne Eriksson, NP, 2 Units at 02/22/21 2351   insulin aspart (novoLOG) injection 3 Units, 3 Units, Subcutaneous, TID WC, Pokhrel, Laxman, MD, 3 Units at 02/26/21 0746   insulin glargine-yfgn (SEMGLEE) injection 25 Units, 25 Units, Subcutaneous, QHS, Pahwani, Rinka R, MD, 25 Units at 02/25/21 2139   lip balm (CARMEX) ointment, , Topical, PRN, Pokhrel, Laxman, MD, 75 application at 44/03/47 1522   lisinopril (ZESTRIL) tablet 20 mg, 20 mg, Oral, Daily, Pokhrel, Laxman, MD, 20 mg at 02/26/21 0944   loratadine (CLARITIN) tablet 10 mg, 10 mg, Oral, Daily, Pahwani, Rinka R, MD, 10 mg at 02/26/21 0944   LORazepam (ATIVAN) tablet 0.25 mg, 0.25 mg, Oral, QHS, Pokhrel, Laxman, MD, 0.25 mg at 02/25/21 2128   MEDLINE mouth rinse, 15 mL, Mouth Rinse, BID, Reubin Milan, MD, 15 mL at 02/25/21 2130   melatonin tablet 9 mg, 9 mg, Oral, QHS, Pokhrel, Laxman, MD, 9 mg at 02/25/21 2128   methimazole (TAPAZOLE) tablet 5 mg, 5 mg, Oral, Daily, Pokhrel, Laxman, MD, 5 mg at 02/26/21 0944    metoCLOPramide (REGLAN) injection 10 mg, 10 mg, Intravenous, TID AC, Pokhrel, Laxman, MD, 10 mg at 02/26/21 0739   metoprolol tartrate (LOPRESSOR) tablet 100 mg, 100 mg, Oral, BID, Pokhrel, Laxman, MD, 100 mg at 02/26/21 0943   nystatin (MYCOSTATIN/NYSTOP) topical powder, , Topical, TID, Reubin Milan, MD, Given at 02/26/21 0947   ondansetron (ZOFRAN) injection 4 mg, 4 mg, Intravenous, Q6H PRN, Pokhrel, Laxman, MD, 4 mg at 02/25/21 0755   pantoprazole (PROTONIX) EC tablet 40  mg, 40 mg, Oral, Daily, Arrien, Jimmy Picket, MD, 40 mg at 02/26/21 0943   polyethylene glycol (MIRALAX / GLYCOLAX) packet 17 g, 17 g, Oral, BID, Pokhrel, Laxman, MD, 17 g at 02/26/21 0943   promethazine (PHENERGAN) 25 mg in sodium chloride 0.9 % 50 mL IVPB, 25 mg, Intravenous, Q6H PRN, Reubin Milan, MD, Last Rate: 200 mL/hr at 02/23/21 1704, 25 mg at 02/23/21 1704   senna (SENOKOT) tablet 8.6 mg, 1 tablet, Oral, QHS, Pokhrel, Laxman, MD, 8.6 mg at 02/25/21 2128   sodium chloride flush (NS) 0.9 % injection 10-40 mL, 10-40 mL, Intracatheter, Q12H, Reubin Milan, MD, 10 mL at 02/26/21 0944   sodium chloride flush (NS) 0.9 % injection 10-40 mL, 10-40 mL, Intracatheter, PRN, Reubin Milan, MD, 10 mL at 02/23/21 0433   sodium chloride flush (NS) 0.9 % injection 3 mL, 3 mL, Intravenous, PRN, Kathryne Eriksson, NP   zolpidem (AMBIEN) tablet 5 mg, 5 mg, Oral, QHS PRN, Pokhrel, Laxman, MD   Patients Current Diet:  Diet Order                  Diet Carb Modified Fluid consistency: Thin; Room service appropriate? Yes  Diet effective now                         Precautions / Restrictions Precautions Precautions: Fall Precaution Comments: L hemi (UE>LE), PICC R UE Restrictions Weight Bearing Restrictions: No    Has the patient had 2 or more falls or a fall with injury in the past year? Yes   Prior Activity Level Limited Community (1-2x/wk): Pt. went out for appointments   Prior Functional  Level Self Care: Did the patient need help bathing, dressing, using the toilet or eating? Needed some help   Indoor Mobility: Did the patient need assistance with walking from room to room (with or without device)? Needed some help   Stairs: Did the patient need assistance with internal or external stairs (with or without device)? Needed some help   Functional Cognition: Did the patient need help planning regular tasks such as shopping or remembering to take medications? Needed some help   Patient Information Are you of Hispanic, Latino/a,or Spanish origin?: A. No, not of Hispanic, Latino/a, or Spanish origin What is your race?: A. White, B. Black or African American Do you need or want an interpreter to communicate with a doctor or health care staff?: 0. No   Patient's Response To:  Health Literacy and Transportation Is the patient able to respond to health literacy and transportation needs?: Yes Health Literacy - How often do you need to have someone help you when you read instructions, pamphlets, or other written material from your doctor or pharmacy?: Never In the past 12 months, has lack of transportation kept you from medical appointments or from getting medications?: No In the past 12 months, has lack of transportation kept you from meetings, work, or from getting things needed for daily living?: No   Development worker, international aid / [place] Devices/Equipment: Eyeglasses Home Equipment: Rollator (4 wheels), Hand held shower head   Prior Device Use: Indicate devices/aids used by the patient prior to current illness, exacerbation or injury? Manual wheelchair and Walker   Current Functional Level Cognition   Overall Cognitive Status: Within Functional Limits for tasks assessed Orientation Level: Oriented X4 General Comments: patient is motivated during session, patient is motivated to get to CIR    Extremity Assessment (  includes Sensation/Coordination)   Upper  Extremity Assessment: RUE deficits/detail, LUE deficits/detail RUE Deficits / Details: ROM WFL, MMT grossly 4-/5 LUE Deficits / Details: patient able to extend elbow AROM with noted flexor synergy pattern noted with sneezing and anxiousness. patient noted to have pain with PROM of middle digit with h/o fx. patient noted to have contracture with inability to PROM wrist into neutral with increased tone on this date. patient no AROM and limited AAROM about 15 degrees of flexion with increased tone noted with attempt to range. LUE Coordination: decreased fine motor, decreased gross motor  Lower Extremity Assessment: Defer to PT evaluation RLE Deficits / Details: AROM WNL, strength 4/5 throughout LLE Deficits / Details: ankle AROM WNL, knee extension 3+/5, hip flexion 3+/5, hip abduction and adduction 3+/5     ADLs   Overall ADL's : Needs assistance/impaired Eating/Feeding: Set up, Sitting Eating/Feeding Details (indicate cue type and reason): to take small sips. Grooming: Wash/dry hands, Wash/dry face, Sitting, Minimal assistance Upper Body Bathing: Sitting, Moderate assistance Lower Body Bathing: Total assistance, Bed level Upper Body Dressing : Maximal assistance, Sitting Lower Body Dressing: Bed level, Total assistance Toilet Transfer: Moderate assistance, Maximal assistance, Control and instrumentation engineer Details (indicate cue type and reason): simulated to bedside chair with attempts to stand pivot and some scooting as well. Toileting- Clothing Manipulation and Hygiene: Maximal assistance, Sit to/from stand, +2 for physical assistance Functional mobility during ADLs: +2 for physical assistance, +2 for safety/equipment General ADL Comments: Marland Kitchen     Mobility   Overal bed mobility: Needs Assistance Bed Mobility: Supine to Sit Supine to sit: Max assist, HOB elevated General bed mobility comments: able to initiate but needed MaxA to pivot hips around to being square at EOB     Transfers    Overall transfer level: Needs assistance Equipment used: 1 person hand held assist Transfers: Sit to/from Stand, Bed to chair/wheelchair/BSC Sit to Stand: Mod assist Bed to/from chair/wheelchair/BSC transfer type:: Stand pivot Stand pivot transfers: Mod assist General transfer comment: on first attempt needed MaxA to stand, able to power up with [place] on second attempt and pivoted hips over to reclier with modA for balance/VC for sequencing and safety     Ambulation / Gait / Stairs / Wheelchair Mobility   Ambulation/Gait General Gait Details: unable with +1 assist     Posture / Balance Dynamic Sitting Balance Sitting balance - Comments: patient was able to self correct sitting balance with min guard. Balance Overall balance assessment: Needs assistance Sitting-balance support: Feet supported, Single extremity supported Sitting balance-Leahy Scale: Fair Sitting balance - Comments: patient was able to self correct sitting balance with min guard. Postural control: Posterior lean Standing balance support: During functional activity, Single extremity supported Standing balance-Leahy Scale: Poor Standing balance comment: consistent UE support     Special needs/care consideration Skin abrasion/excoriation to Lip and Left leg, ecchymosis of bilateral arms, excoriation on bilateral breasts and Diabetic management novolog 0-15 units sQ 3x day with meals, Novolog 0-5 units sQ daily at bedtime, and 3 units sQ daily with meals; Semglee 25 units sQ daily at bedtime.      Previous Home Environment (from acute therapy documentation) Living Arrangements: Children  Lives With: Family Available Help at Discharge: Family, Available 24 hours/day Type of Home: [place] Name:  (Searching for another one) Home Layout: One level Home Access: Stairs to enter [employer] of Steps: Pt lives on 3rd floor apartment with multiple flights to access. Bathroom Shower/Tub: [place]  Services: No Additional Comments: Patient reported plan is to transfer to grandfathers house with 3 steps to enter with family support at time of d/c. patient reported that possiblity of ramp to enter was available if needed after CIR. patient is very motivated to transition to CIR at this time.   Discharge Living Setting Plans for Discharge Living Setting: House Type of Home at Discharge: House Discharge Home Layout: One level Discharge Home Access: Stairs to enter Discharge Bathroom Shower/Tub: Walk-in shower Discharge Bathroom Toilet: Handicapped height Discharge Bathroom Accessibility: Yes How Accessible: Accessible via walker Does the patient have any problems obtaining your medications?: Yes (Describe)   Social/Family/Support Systems Patient Roles: Spouse Contact Information: [phone] Anticipated Caregiver: [place] Faster [facility] Anticipated Caregiver's Contact Information: [phone] Caregiver Availability: 24/7 Discharge Plan Discussed with Primary Caregiver: Yes Is Caregiver In Agreement with Plan?: Yes   Goals Patient/Family Goal for Rehab: PT/OT MIn A Expected length of stay: 14-16 days Pt/Family Agrees to Admission and willing to participate: Yes Program Orientation Provided & Reviewed with Pt/Caregiver Including Roles  & Responsibilities: Yes   Decrease burden of Care through IP rehab admission: Specialzed equipment needs, Decrease number of caregivers, Bowel and bladder program, and Patient/family education   Possible need for SNF placement upon discharge: not anticipated    Patient Condition: I have reviewed medical records from [facility], spoken with CM, and met with patient at the bedsidepatient and daughter. I  for inpatient rehabilitation assessment.  Patient will benefit from ongoing PT and OT, can actively participate in 3 hours of therapy a day 5 days of the week, and can make measurable gains during the admission.  Patient will  also benefit from the coordinated team approach during an Inpatient Acute Rehabilitation admission.  The patient will receive intensive therapy as well as Rehabilitation physician, nursing, social worker, and care management interventions.  Due to bladder management, bowel management, safety, skin/wound care, disease management, medication administration, pain management, and patient education the patient requires 24 hour a day rehabilitation nursing.  The patient is currently mod A with mobility and basic ADLs.  Discharge setting and therapy post discharge at home with home health is anticipated.  Patient has agreed to participate in the Acute Inpatient Rehabilitation Program and will admit today.   Preadmission Screen Completed By:  Genella Mech, 02/26/2021 10:28 AM ______________________________________________________________________   Discussed status with Dr. Naaman Plummer on 1/23/223 at 64 and received approval for admission today.   Admission Coordinator:  Genella Mech, CCC-SLP, time 1000/Date 11/26/21    Assessment/Plan: Diagnosis: debility related to DKA and multiple medical Does the need for close, 24 hr/day Medical supervision in concert with the patient's rehab needs make it unreasonable for this patient to be served in a less intensive setting? Yes Co-Morbidities requiring supervision/potential complications: CAD, DM, HTN Due to bladder management, bowel management, safety, skin/wound care, disease management, medication administration, pain management, and patient education, does the patient require 24 hr/day rehab nursing? Yes Does the patient require coordinated care of a physician, rehab nurse, PT, OT to address physical and functional deficits in the context of the above medical diagnosis(es)? Yes Addressing deficits in the following areas: balance, endurance, locomotion, strength, transferring, bowel/bladder control, bathing, dressing, feeding, grooming, toileting, and psychosocial  support Can the patient actively participate in an intensive therapy program of at least 3 hrs of therapy 5 days a week? Yes The potential for patient to make measurable gains while on inpatient rehab is excellent Anticipated functional outcomes upon discharge from  inpatient rehab: min assist PT, min assist OT, n/a SLP Estimated rehab length of stay to reach the above functional goals is: 14-16 days Anticipated discharge destination: Home 10. Overall Rehab/Functional Prognosis: excellent     MD Signature: Meredith Staggers, MD, [place] Director Rehabilitation Services

## 2021-02-26 NOTE — Progress Notes (Signed)
Inpatient Rehabilitation Admission Medication Review by a Pharmacist  A complete drug regimen review was completed for this patient to identify any potential clinically significant medication issues.  High Risk Drug Classes Is patient taking? Indication by Medication  Antipsychotic Yes Compazine prn for N/V  Anticoagulant Yes Lovenox for VTE ppx  Antibiotic No   Opioid No   Antiplatelet Yes Plavix for CVA ppx  Hypoglycemics/insulin Yes SSI, Semglee for DM  Vasoactive Medication Yes Norvasc, lisinopril, metoproplol for BP  Chemotherapy No   Other Yes Lexapro for mood Lorazepam for anxiety Protonix, Reglan for GERD     Type of Medication Issue Identified Description of Issue Recommendation(s)  Drug Interaction(s) (clinically significant)     Duplicate Therapy     Allergy     No Medication Administration End Date     Incorrect Dose     Additional Drug Therapy Needed     Significant med changes from prior encounter (inform family/care partners about these prior to discharge).    Other       Clinically significant medication issues were identified that warrant physician communication and completion of prescribed/recommended actions by midnight of the next day:  No  Pharmacist comments: None  Time spent performing this drug regimen review (minutes):  20 minutes   Tad Moore 02/26/2021 2:17 PM

## 2021-02-26 NOTE — Care Management Important Message (Signed)
Important Message  Patient Details IM Letter given to the Patient. Name: Lori [name] MRN: [id number] Date of Birth: [birth year]   Medicare Important Message Given:  Yes     Kerin Salen 02/26/2021, 10:11 AM

## 2021-02-26 NOTE — Progress Notes (Signed)
Inpatient Rehab Admissions Coordinator:  ? ?I have a bed for this Pt. On CIR today. RN may call report to [phone]. ? ?Caramia Boutin, MS, CCC-SLP ?Rehab Admissions Coordinator  ?[phone] (celll) ?[phone] (office) ?

## 2021-02-27 DIAGNOSIS — R5381 Other malaise: Secondary | ICD-10-CM | POA: Diagnosis not present

## 2021-02-27 LAB — GLUCOSE, CAPILLARY
Glucose-Capillary: 196 mg/dL — ABNORMAL HIGH (ref 70–99)
Glucose-Capillary: 287 mg/dL — ABNORMAL HIGH (ref 70–99)
Glucose-Capillary: 179 mg/dL — ABNORMAL HIGH (ref 70–99)
Glucose-Capillary: 325 mg/dL — ABNORMAL HIGH (ref 70–99)

## 2021-02-27 LAB — CBC WITH DIFFERENTIAL/PLATELET
Abs Immature Granulocytes: 0.03 10*3/uL (ref 0.00–0.07)
Basophils Absolute: 0 10*3/uL (ref 0.0–0.1)
Basophils Relative: 1 %
Eosinophils Absolute: 0.1 10*3/uL (ref 0.0–0.5)
Eosinophils Relative: 2 %
HCT: 34.4 % — ABNORMAL LOW (ref 36.0–46.0)
Hemoglobin: 11.3 g/dL — ABNORMAL LOW (ref 12.0–15.0)
Immature Granulocytes: 0 %
Lymphocytes Relative: 41 %
Lymphs Abs: 2.8 10*3/uL (ref 0.7–4.0)
MCH: 27.2 pg (ref 26.0–34.0)
MCHC: 32.8 g/dL (ref 30.0–36.0)
MCV: 82.7 fL (ref 80.0–100.0)
Monocytes Absolute: 0.6 10*3/uL (ref 0.1–1.0)
Monocytes Relative: 9 %
Neutro Abs: 3.2 10*3/uL (ref 1.7–7.7)
Neutrophils Relative %: 47 %
Platelets: 502 10*3/uL — ABNORMAL HIGH (ref 150–400)
RBC: 4.16 MIL/uL (ref 3.87–5.11)
RDW: 16.3 % — ABNORMAL HIGH (ref 11.5–15.5)
WBC: 6.8 10*3/uL (ref 4.0–10.5)
nRBC: 0 % (ref 0.0–0.2)

## 2021-02-27 LAB — COMPREHENSIVE METABOLIC PANEL
ALT: 20 U/L (ref 0–44)
AST: 16 U/L (ref 15–41)
Albumin: 2.9 g/dL — ABNORMAL LOW (ref 3.5–5.0)
Alkaline Phosphatase: 71 U/L (ref 38–126)
Anion gap: 8 (ref 5–15)
BUN: 12 mg/dL (ref 6–20)
CO2: 25 mmol/L (ref 22–32)
Calcium: 8.6 mg/dL — ABNORMAL LOW (ref 8.9–10.3)
Chloride: 103 mmol/L (ref 98–111)
Creatinine, Ser: 0.76 mg/dL (ref 0.44–1.00)
GFR, Estimated: >60 mL/min (ref >60)
Glucose, Bld: 196 mg/dL — ABNORMAL HIGH (ref 70–99)
Potassium: 4.3 mmol/L (ref 3.5–5.1)
Sodium: 136 mmol/L (ref 135–145)
Total Bilirubin: 0.6 mg/dL (ref 0.3–1.2)
Total Protein: 6.1 g/dL — ABNORMAL LOW (ref 6.5–8.1)

## 2021-02-27 MED ORDER — CHLORHEXIDINE GLUCONATE CLOTH 2 % EX PADS
6.0000 | MEDICATED_PAD | Freq: Every day | CUTANEOUS | Status: DC
  Administered 2021-02-27 – 2021-03-01 (×3): 6 via TOPICAL

## 2021-02-27 MED ORDER — SORBITOL 70 % SOLN: 30.0000 mL | Freq: Once | Status: DC

## 2021-02-27 MED ORDER — ENOXAPARIN SODIUM 40 MG/0.4ML IJ SOSY
40.0000 mg | PREFILLED_SYRINGE | INTRAMUSCULAR | Status: DC
  Administered 2021-02-27 – 2021-03-15 (×17): 40 mg via SUBCUTANEOUS
  Filled 2021-02-27 (×17): qty 0.4

## 2021-02-27 MED ORDER — SODIUM CHLORIDE 0.9% FLUSH
10.0000 mL | INTRAVENOUS | Status: DC | PRN
  Administered 2021-02-27: 07:00:00 10 mL

## 2021-02-27 NOTE — Patient Care Conference (Signed)
Inpatient RehabilitationTeam Conference and Plan of Care Update Date: 02/27/2021   Time: 11:36 AM    Patient Name: Lori [name] [facility]      Medical Record Number: [id number]  Date of Birth: 1970/06/15 Sex: Female         Room/Bed: 4W06C/4W06C-01 Payor Info: Payor: MEDICARE / Plan: MEDICARE PART A / Product Type: *No Product type* /    Admit Date/Time:  02/26/2021  2:07 PM  Primary Diagnosis:  [facility] Problems: Principal Problem:   Debility    Expected Discharge Date: Expected Discharge Date: 03/16/21  Team Members Present: Physician leading conference: Dr. Courtney Heys Social Worker Present: Ovidio Kin, LCSW Nurse Present: Dorien Chihuahua, RN PT Present: Ailene Rud, PT OT Present: Lillia Corporal, OT SLP Present: Charolett Bumpers, SLP PPS Coordinator present : Gunnar Fusi, SLP     Current Status/Progress Goal Weekly Team Focus  Bowel/Bladder   incontinent of b/b; LBM: 01/23  regain continences  assist with toileting needs PRN   Swallow/Nutrition/ Hydration             ADL's   UB dress Max, LB dress total, sit <> stand Mod A, did not transfer 2/2 time likely stedy  Min A  standing balance/tolerance, sitting balance/tolerance, LUE ROM, general conditioning, OOB tolerance   Mobility   max bed mobility, mod sts, max spt  min A overall  transfers, gait, NMR   Communication             Safety/Cognition/ Behavioral Observations            Pain   no c/o pain  remain pain free  assess pain QS and prn   Skin   MASD under breast and groin, scheduled nystain powder  remain free of new skin breakdown/infection  assess skin QS and prn     Discharge Planning:  new evaluation was living in NH prior to re-admission to hospital. Will need to confirm if family does plan to take her home from here   Team Discussion: Patient with history of multiple CVA, left hemiparesis and hyperextension of left hand. 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  BP stable and constipation addressed. Patient on target to meet rehab goals: Currently needs max assist for upper  body care and total assist for lower body care. Goals for discharge set for min assist overall.   *See Care Plan and progress notes for long and short-term goals.   Revisions to Treatment Plan:  N/A   Teaching Needs: Safety, skin care, medication management/insulin administration, transfers, toileting, dietary modifications, secondary risk management, etc.  Current Barriers to Discharge: Home enviroment access/layout, Incontinence, Lack of/limited family support, and non - compliance with dietary restrictions  Possible Resolutions to Barriers: Family education Recommend hired caregiver to support daughters     Medical Summary Current Status: very noncompliant- wants a lot of sugar/juices- no concept of DM; incontientn B/B; no pain; sleeping adequate vs poor? no skin issues; poor controlleed DM- came in with DKA  Barriers to Discharge: Decreased family/caregiver support;Home enviroment access/layout;Incontinence;Neurogenic Bowel & Bladder;Medical stability;Weight;Other (comments)  Barriers to Discharge Comments: was at nursing home for months or longer- going to grandfather who's in 37s- not sure where to go dispo-wise; husband? hieplegic due to old CVAs Possible Resolutions to [employer] Focus: limitations- max-total A; so slow/delayed/learned helplessness/cognition/poor memory; poor compliance with DM; length of stay- d/c set for 2/10   Continued Need for Acute Rehabilitation Level of Care: The patient requires  daily medical management by a physician with specialized training in physical medicine and rehabilitation for the following reasons: Direction of a multidisciplinary physical rehabilitation program to maximize functional independence : Yes Medical management of patient stability for increased activity during participation in an intensive  rehabilitation regime.: Yes Analysis of laboratory values and/or radiology reports with any subsequent need for medication adjustment and/or medical intervention. : Yes   I attest that I was present, lead the team conference, and concur with the assessment and plan of the team.   Dorien Chihuahua B 02/27/2021, 4:08 PM

## 2021-02-27 NOTE — Evaluation (Signed)
Occupational Therapy Assessment and Plan  Patient Details  Name: Lori [name] MRN: [id number] Date of Birth: Aug 17, 1970  OT Diagnosis: abnormal posture, altered mental status, hemiplegia affecting non-dominant side, and muscle weakness (generalized) Rehab Potential:   ELOS: 16-18 days   Today's Date: 02/27/2021 OT Individual Time: 4401-0272 OT Individual Time Calculation (min): 55 min     Hospital Problem: Principal Problem:   Debility   Past Medical History:  Past Medical History:  Diagnosis Date   CAD (coronary artery disease)    Depression    Diabetes mellitus without complication ([place])    Hypertension    Pheochromocytoma    Pheochromocytoma    Stroke ([place])    Thyroid disease    Past Surgical History:  Past Surgical History:  Procedure Laterality Date   CORONARY ARTERY BYPASS GRAFT     Right adrenal gland removal for pheochromocytoma Right     Assessment & Plan Clinical Impression: Lori [name] is a 51 year old RH-female with history of T2DM, HTN, multiple prior strokes-[most significant last Jan with aphasia and right sided symptoms and then left hemiplegia since 10/22. She was recently admitted to [facility]21 with inability to speak with BLE weakness and projectile vomiting. CTA head/neck showed atherosclerosis most severe in PCA with L-VA and BA stenosis and possible flow limiting stenosis in right carotid siphon. MRI brain showed showed multiple old infarcts with moderate volume loss and not acute changes. Dr. Quinn Axe felt patient with recrudescence of symptoms in setting off GI illness and recommended SBP goal 130-150 given severe intracranial stenosis. (Patient familiar to Dr. Quinn Axe from [facility]). Also reports of brain Ca resection in the past. She was also found to have tachycardia due to hyperthyroidism and was started on tapazole.    Her verbal output was improving but continued to be limited by Left hemiplegia with left field cut. She was discharged to  [facility] for rehab but has had repeat admission for N/V and noted to be in DKA on most recent admission to [facility] on 02/19/21. She was treated with IV insulin, IVF and IV Cardizem for rate control.  DKA has resolved and insulin being titrated for better control. N/V has resolved with IV reglan. Therapy ongoing and patient showing good participation and eager to return to prior level of function. CIR recommended due to functional decline.  Patient transferred to CIR on 02/26/2021 .    Patient currently requires max - total with basic self-care skills secondary to muscle weakness, decreased cardiorespiratoy endurance, impaired timing and sequencing, abnormal tone, unbalanced muscle activation, ataxia, decreased coordination, and decreased motor planning, decreased motor planning, decreased initiation, decreased attention, decreased awareness, decreased problem solving, decreased safety awareness, decreased memory, and delayed processing, and decreased sitting balance, decreased standing balance, decreased postural control, hemiplegia, and decreased balance strategies.  Prior to hospitalization, patient could complete BADL with mod.  Patient will benefit from skilled intervention to decrease level of assist with basic self-care skills prior to discharge home with care partner.  Anticipate patient will require 24 hour supervision and follow up home health.  OT - End of Session Activity Tolerance: Tolerates 10 - 20 min activity with multiple rests Endurance Deficit: Yes Endurance Deficit Description: Pt fatigues quickly OT Assessment OT Barriers to Discharge: [place] home environment;Home environment access/layout;Decreased caregiver support OT Patient demonstrates impairments in the following area(s): Balance;Behavior;Cognition;Edema;Endurance;Motor;Perception;Safety;Sensory;Vision OT Basic ADL's Functional Problem(s): Eating;Grooming;Bathing;Dressing;Toileting OT Transfers Functional Problem(s):  Toilet;Tub/Shower OT Additional Impairment(s): Fuctional Use of Upper Extremity OT Plan OT Intensity:  Minimum of 1-2 x/day, 45 to 90 minutes OT Frequency: 5 out of 7 days OT Duration/Estimated Length of Stay: 16-18 days OT Treatment/Interventions: Balance/vestibular training;Discharge planning;Functional electrical stimulation;Self Care/advanced ADL retraining;Therapeutic Activities;UE/LE Coordination activities;Cognitive remediation/compensation;Disease mangement/prevention;Functional mobility training;Patient/family education;Skin care/wound managment;Therapeutic Exercise;Visual/perceptual remediation/compensation;Wheelchair propulsion/positioning;UE/LE Strength taining/ROM;Splinting/orthotics;Psychosocial support;Neuromuscular re-education;DME/adaptive equipment instruction;Community reintegration OT Self Feeding Anticipated Outcome(s): set up OT Basic Self-Care Anticipated Outcome(s): Min A OT Toileting Anticipated Outcome(s): Min A OT Bathroom Transfers Anticipated Outcome(s): Min A OT Recommendation Recommendations for Other Services: Speech consult Patient destination: Home Follow Up Recommendations: Home health OT;24 hour supervision/assistance Equipment Recommended: To be determined   OT Evaluation Precautions/Restrictions  Precautions Precautions: Fall Precaution Comments: L hemi (UE>LE), PICC R UE Restrictions Weight Bearing Restrictions: No Pain Pain Assessment Pain Scale: 0-10 Pain Score: 0-No pain Home Living/Prior Functioning Home Living Family/patient expects to be discharged to:: Inpatient rehab Living Arrangements: Children Available Help at Discharge: Family, Available 24 hours/day Type of Home: Apartment Home Access: Stairs to enter [employer] of Steps: Pt lives on 3rd floor apartment with multiple flights to access (at daughters apartment) Home Layout: One level (at grandfathers house) Additional Comments: Patient reported plan is to transfer to  grandfathers house with 3 steps to enter with family support at time of d/c. patient reported that possiblity of ramp to enter was available if needed after CIR.  Lives With: Family Prior Function Level of Independence: Requires assistive device for independence, Needs assistance with ADLs, Needs assistance with tranfers, Needs assistance with homemaking  Able to Take Stairs?: Yes Driving: Yes Vocation: Full time employment Vision Baseline Vision/History: 1 Wears glasses Ability to See in Adequate Light: 1 Impaired Patient Visual Report: No change from baseline Vision Assessment?: Vision impaired- to be further tested in functional context Additional Comments: left visual field cut, very minimal peripheral vision on L side Perception  Perception: Impaired Praxis Praxis: Impaired Praxis Impairment Details: Motor planning Cognition Overall Cognitive Status: Within Functional Limits for tasks assessed Arousal/Alertness: Awake/alert Orientation Level: Person Year: 2023 Month: January Day of Week: Incorrect Memory: Impaired Memory Impairment: Decreased recall of new information;Storage deficit Immediate Memory Recall: Sock;Blue;Bed Memory Recall Sock: Without Cue Memory Recall Blue: Without Cue Memory Recall Bed: Without Cue Awareness: Impaired Problem Solving: Impaired Safety/Judgment: Impaired Sensation Sensation Light Touch: (P) Appears Intact Proprioception: (P) Appears Intact Stereognosis: (P) Not tested Coordination Gross Motor Movements are Fluid and Coordinated: Yes Fine Motor Movements are Fluid and Coordinated: Yes Coordination and Movement Description: old L hemi UE>LE, flexed posture Finger Nose Finger Test: unable to perform with LUE Motor  Motor Motor: Hemiplegia Motor - Skilled Clinical Observations: L hemi  Trunk/Postural Assessment  Cervical Assessment Cervical Assessment: Within Functional Limits Thoracic Assessment Thoracic Assessment: Exceptions to  [facility] Lumbar Assessment Lumbar Assessment: Exceptions to [facility] Postural Control Postural Control: Deficits on evaluation Righting Reactions: delayed and inadequate  Balance Balance Balance Assessed: Yes Standardized Balance Assessment Standardized Balance Assessment: PASS Postural Assessment Scale for Stroke Patients=PASS 1. Sitting Without Support: Can sit with slight support (for example, by 1 hand) 2. Standing With Support: Can stand with moderate support of 1 person 3. Standing Without Support: Can stand without support for 10 seconds or leans leavily on 1 leg 4.Standing on Nonparetic Leg: Can stand on nonparetic leg for a few seconds 5.Standing on Paretic Leg: Cannot stand on paretic leg MAINTAINING POSTURE SUBTOTAL: 5 6. Supine to Paretic Side Lateral: Can perform with little help 7. Supine to Nonparetic Side Lateral: Can perform with little help 8. Supine to Sitting  Up on the Edge of the Mat: Can perform with much help 9. Sitting on the Edge of the Mat to Supine: Can perform with much help 10. Sitting to Standing Up: Can perform with much help 11. Standing Up to Sitting Down: Can perform with little help 12. Standing,Picking Up a Pencil from the Floor: Cannot perform CHANGING POSTURE SUBTOTAL: 9 PASS TOTAL SCORE: 14 Dynamic Sitting Balance Dynamic Sitting - Balance Support: Feet supported Dynamic Sitting - Level of Assistance: 4: Min assist Dynamic Sitting - Balance Activities: Lateral lean/weight shifting;Forward lean/weight shifting Sitting balance - Comments: patient was able to self correct sitting balance with min guard. Static Standing Balance Static Standing - Balance Support: During functional activity;Bilateral upper extremity supported Static Standing - Level of Assistance: 4: Min assist Dynamic Standing Balance Dynamic Standing - Balance Support: During functional activity Dynamic Standing - Level of Assistance: 3: Mod assist Dynamic Standing - Balance Activities:  Forward lean/weight shifting Extremity/Trunk Assessment RUE Assessment RUE Assessment: Exceptions to [facility] Active Range of Motion (AROM) Comments: WFL General Strength Comments: very slow deliberate movements, roughly 3/5 limited by weakness LUE Assessment LUE Assessment: Exceptions to [facility] Passive Range of Motion (PROM) Comments: limited PROM 2/2 increased tone from old L hemi General Strength Comments: roughly 2/5 limited by old L hemi  Care Tool Care Tool Self Care Eating    Min A    Oral Care    Oral Care Assist Level: Supervision/Verbal cueing    Bathing   Body parts bathed by patient: Left arm;Chest;Abdomen;Right upper leg;Left upper leg;Front perineal area;Face Body parts bathed by helper: Buttocks;Right arm;Right lower leg;Left lower leg   Assist Level: Maximal Assistance - Patient 24 - 49%    Upper Body Dressing(including orthotics)   What is the patient wearing?: Pull over shirt   Assist Level: Total Assistance - Patient < 25%    Lower Body Dressing (excluding footwear)   What is the patient wearing?: Incontinence brief;Pants Assist for lower body dressing: Total Assistance - Patient < 25%    Putting on/Taking off footwear      dependent       Care Tool Toileting Toileting activity   Assist for toileting: Dependent - Patient 0%     Care Tool Bed Mobility Roll left and right activity   Roll left and right assist level: Maximal Assistance - Patient 25 - 49%    Sit to lying activity   Sit to lying assist level: Maximal Assistance - Patient 25 - 49%    Lying to sitting on side of bed activity   Lying to sitting on side of bed assist level: the ability to move from lying on the back to sitting on the side of the bed with no back support.: Maximal Assistance - Patient 25 - 49%     Care Tool Transfers Sit to stand transfer   Sit to stand assist level: Moderate Assistance - Patient 50 - 74%    Chair/bed transfer   Chair/bed transfer assist level: Maximal  Assistance - Patient 25 - 49%     Toilet transfer   Assist Level:  (not observed at time of eval 2/2 time)     Care Tool Cognition  Expression of Ideas and Wants Expression of Ideas and Wants: 3. Some difficulty - exhibits some difficulty with expressing needs and ideas (e.g, some words or finishing thoughts) or speech is not clear  Understanding Verbal and Non-Verbal Content Understanding Verbal and Non-Verbal Content: 3. Usually understands - understands most conversations, but  misses some part/intent of message. Requires cues at times to understand   Memory/Recall Ability Memory/Recall Ability : Current season;That he or she is in a hospital/hospital unit   Refer to Care Plan for [place] 1 OT Short Term Goal 1 (Week 1): Pt will transfer to BSC/toilet with 1 assist and LRAD OT Short Term Goal 2 (Week 1): Pt will don shirt with MOD A with hemitechnique OT Short Term Goal 3 (Week 1): Pt will don pants over hips in standing with MOD A  Recommendations for other services: None    Skilled Therapeutic Intervention ADL ADL Eating: Not assessed Grooming: Minimal assistance Upper Body Bathing: Moderate assistance Lower Body Bathing: Maximal assistance Upper Body Dressing: Maximal assistance Lower Body Dressing: Dependent Toileting: Dependent Mobility  Bed Mobility Bed Mobility: Supine to Sit;Sit to Supine Supine to Sit: Maximal Assistance - Patient - Patient 25-49% Sit to Supine: Maximal Assistance - Patient 25-49% Transfers Sit to Stand: Moderate Assistance - Patient 50-74% Stand to Sit: Moderate Assistance - Patient 50-74%   Skilled Interventions: Pt greeted at time of session semireclined in bed agreeable to OT session, reviewed purpose and plan of OT. Extensive time needed at beginning of session as pt requires extensive time to communicate and relay information. After returning with items needed including RW, scrub clothes, etc. Pt stating at  this time she is soiled and needed to be changed, brief change bed level total A with encouraging pt to complete her own pericare. Supine <> sit Max A with bed features, sit <> stands with Mod A with RW, and UB dress total/Max and same manner for LB dressing for pants, cues for hemitechniques. Sitting EOB for oral hygiene and grooming tasks with Supervision - Min A. Pt reclined bed level alarm on call bell in reach.    Discharge Criteria: Patient will be discharged from OT if patient refuses treatment 3 consecutive times without medical reason, if treatment goals not met, if there is a change in medical status, if patient makes no progress towards goals or if patient is discharged from hospital.  The above assessment, treatment plan, treatment alternatives and goals were discussed and mutually agreed upon: by patient  [name] 02/27/2021, 12:41 PM

## 2021-02-27 NOTE — Progress Notes (Addendum)
Patient ID: Lori [name], female   DOB: [birth year], 51 y.o.   MRN: [id number]  Have met wit pt but she is unreliable regarding her information due to past history of 22 CVA's. Have left message for [name]-daughter and [name]-husband to call this worker back to verify information-discharge plan and caregivers.

## 2021-02-27 NOTE — Progress Notes (Addendum)
Inpatient Rehabilitation Care Coordinator Assessment and Plan Patient Details  Name: Lori [name] MRN: [id number] Date of Birth: [birth year]  Today's Date: 02/27/2021  Hospital Problems: Principal Problem:   Debility  Past Medical History:  Past Medical History:  Diagnosis Date   CAD (coronary artery disease)    Depression    Diabetes mellitus without complication ([place])    Hypertension    Pheochromocytoma    Pheochromocytoma    Stroke [facility] [place])    Thyroid disease    Past Surgical History:  Past Surgical History:  Procedure Laterality Date   CORONARY ARTERY BYPASS GRAFT     Right adrenal gland removal for pheochromocytoma Right    Social History:  reports that she has never smoked. She has never used smokeless tobacco. She reports that she does not currently use alcohol. She reports that she does not use drugs.  Family / Support Systems Marital Status: Married How Long?: separated two years now back together-married 23 years before separation Patient Roles: Spouse, Parent Spouse/Significant Other: [phone] Children: [name]-daughter [phone]  Two other daughter's 18 & 36 yo Ability/Limitations of Caregiver: Unsure who will be her caregiver at discharge-pt has been in a NH for months and home for not  longer than 3 days and then back in the hospital for the past year Caregiver Availability: Other (Comment) (unable to confirm who will be her caregiver at DC. Husband has his own business, [name] works from home and her younger tow daughter;s are in [employer]) Family Dynamics: Pt reports she is back with her husband when she had her first strokes she was mean and was in [place] where her Mom lives so not close to husband-who is in [place]. Her younger two daughter's live with [name] in [place]. Pt has been in a NH for numerous months after each stroke and not home for more than three days  Social History Preferred language: English Religion: [employer] Cultural  Background: No issues Education: [place] - How often do you need to have someone help you when you read instructions, pamphlets, or other written material from your doctor or pharmacy?: Never Writes: Yes Employment Status: Disabled Date Retired/Disabled/Unemployed: 2014 Public relations account executive Issues: No issues Guardian/Conservator: None-accordng to MD pt is questionable capable to make her own decisions. Since legally married will look toward her husband for any decisions needing to be made while here   Abuse/Neglect Abuse/Neglect Assessment Can Be Completed: Yes Physical Abuse: Denies Verbal Abuse: Denies Sexual Abuse: Denies Exploitation of patient/patient's resources: Denies Self-Neglect: Denies  Patient response to: Social Isolation - How often do you feel lonely or isolated from those around you?: Never  Emotional Status Pt's affect, behavior and adjustment status: Pt is ready to get home and feels she can accomplish this while here. Discussed this is a short term rehab and not more than 2-3 weeks here. She is motivated to improve and wants to get back with her children feels has missed a lot of them grwoing up with all of her CVA's. Recent Psychosocial Issues: Multiple CVA's which began in 2014 in and out of NH's Psychiatric History: History of depression due to CVA's and not being able to be at home. Would benefit from seeing neuro-psych while here. Will place on list to be seen while here Substance Abuse History: No issues  Patient / Family Perceptions, Expectations & Goals Pt/Family understanding of illness & functional limitations: Pt can explain her strokes and is aware of her deficits, but has no sense of time thinks  she worked last year which is wrong. She does talk with the MD and feels understands her goals here. Not spoken with family so not sure their understanding of all that is going on Premorbid pt/family roles/activities: Mom, wife, granddaughter,  etc Anticipated changes in roles/activities/participation: resume Pt/family expectations/goals: Pt states: " I want to go home and be with my family I have missed so much already with all of this."  [employer]: Other (Comment) (was at [facility] prior to admission to hospital) Premorbid Home Care/DME Agencies: None (when home was not deemed safe on third floor so no HH would accept referral) Transportation available at discharge: Facility would transport to appointments Is the patient able to respond to transportation needs?: Yes In the past 12 months, has lack of transportation kept you from medical appointments or from getting medications?: Yes In the past 12 months, has lack of transportation kept you from meetings, work, or from getting things needed for daily living?: Yes Resource referrals recommended: Neuropsychology  Discharge Planning Living Arrangements: Other (Comment) (SNF prior to admission) Support Systems: Children, Spouse/significant other, Other relatives Type of Residence: [place] Name: Other (enter name of facility below) [place] Name: [employer] Resources: Information systems manager, [employer] (specify county) Museum/gallery curator Resources: [employer] Screen Referred: No Living Expenses: Other (Comment) (In facility in Medicaid days due to had exhausted medicare coverage) Money Management: Other (Comment) (facility) Does the patient have any problems obtaining your medications?: No Home Management: NA Patient/Family Preliminary Plans: According to pt she is going to [place] yo home where it is more accessible. Unsure who will be her caregiver due to [place] lives in [place] and works and her younger daughter's are in [facility] in [place]. Her husband has his own business-mechanic and is busy. Unsure who will provide 24/7 care at this time. Pt will require 24/7 physical care at DC from rehab Care  Coordinator Barriers to Discharge: Decreased caregiver support, Medication compliance, Insurance for SNF coverage, Other (comments) Care Coordinator Barriers to Discharge Comments: Exhausted Medicare coverage and was in medicaid coverage difficult to find NH to take Medicaid-limited options Care Coordinator Anticipated Follow Up Needs: HH/OP, SNF  Clinical Impression  Pt is motivated to be here and to try to improve from her multiple CVA's. She has been in many NH's and only home for three day stretches and then back in the hospital. At this time do not know who will be her caregiver at discharge. It was too much care for daughter's in the past not sure will be any different. Will await return calls from [place] and pt's husband. Will ask neuro-psych to see while here for coping, pt has been through a lot with all of her strokes.  Elease Hashimoto 02/27/2021, 3:08 PM

## 2021-02-27 NOTE — Evaluation (Signed)
Physical Therapy Assessment and Plan  Patient Details  Name: Lori [name] MRN: [id number] Date of Birth: 1970/12/09  PT Diagnosis: Abnormality of gait, Cognitive deficits, Coordination disorder, Difficulty walking, Hemiplegia non-dominant, Impaired cognition, Impaired sensation, and Muscle weakness Rehab Potential: Fair ELOS: 18-21 days   Today's Date: 02/27/2021 PT Individual Time: 1002-1108, [id number] PT Individual Time Calculation (min): 66 min, 41 min  Hospital Problem: Principal Problem:   Debility   Past Medical History:  Past Medical History:  Diagnosis Date   CAD (coronary artery disease)    Depression    Diabetes mellitus without complication ([place])    Hypertension    Pheochromocytoma    Pheochromocytoma    Stroke ([place])    Thyroid disease    Past Surgical History:  Past Surgical History:  Procedure Laterality Date   CORONARY ARTERY BYPASS GRAFT     Right adrenal gland removal for pheochromocytoma Right     Assessment & Plan Clinical Impression: Lori [name] is a 51 year old RH-female with history of T2DM, HTN, multiple prior strokes-[most significant last Jan with aphasia and right sided symptoms and then left hemiplegia since 10/22. She was recently admitted to [facility] on 01/19/21 with inability to speak with BLE weakness and projectile vomiting. CTA head/neck showed atherosclerosis most severe in PCA with L-VA and BA stenosis and possible flow limiting stenosis in right carotid siphon. MRI brain showed showed multiple old infarcts with moderate volume loss and not acute changes. Dr. Quinn Axe felt patient with recrudescence of symptoms in setting off GI illness and recommended SBP goal 130-150 given severe intracranial stenosis. (Patient familiar to Dr. Quinn Axe from [facility]). Also reports of brain Ca resection in the past. She was also found to have tachycardia due to hyperthyroidism and was started on tapazole.    Her verbal output was improving but continued to be  limited by Left hemiplegia with left field cut. She was discharged to [facility] for rehab but has had repeat admission for N/V and noted to be in DKA on most recent admission to [facility] on 02/19/21. She was treated with IV insulin, IVF and IV Cardizem for rate control.  DKA has resolved and insulin being titrated for better control. N/V has resolved with IV reglan. Therapy ongoing and patient showing good participation and eager to return to prior level of function. CIR recommended due to functional decline.      Patient transferred to CIR on 02/26/2021 .   Patient currently requires mod with mobility secondary to muscle weakness, abnormal tone and decreased coordination, decreased initiation and decreased memory, and decreased standing balance, hemiplegia, and decreased balance strategies.  Prior to hospitalization, patient was modified independent  with mobility and lived with Family in a [place] home.  Home access is Pt lives on 3rd floor apartment with multiple flights to access.Stairs to enter.  Patient will benefit from skilled PT intervention to maximize safe functional mobility, minimize fall risk, and decrease caregiver burden for planned discharge home with 24 hour supervision.  Anticipate patient will benefit from follow up [place] at discharge.  PT - End of Session Activity Tolerance: Tolerates 10 - 20 min activity with multiple rests Endurance Deficit: Yes Endurance Deficit Description: Pt fatigues quickly PT Assessment Rehab Potential (ACUTE/IP ONLY): Fair PT Barriers to Discharge: Inaccessible home environment;Decreased caregiver support;Incontinence;Lack of/limited family support;Weight PT Patient demonstrates impairments in the following area(s): Balance;Pain;Perception;Edema;Endurance;Sensory;Motor;Safety;Skin Integrity PT Transfers Functional Problem(s): Bed Mobility;Bed to Chair;Car;Furniture PT Locomotion Functional Problem(s): Ambulation;Wheelchair Mobility PT Plan PT Intensity:  Minimum  of 1-2 x/day ,45 to 90 minutes PT Frequency: 5 out of 7 days PT Duration Estimated Length of Stay: 18-21 days PT Treatment/Interventions: Ambulation/gait training;Cognitive remediation/compensation;Discharge planning;DME/adaptive equipment instruction;Functional mobility training;Pain management;Psychosocial support;Splinting/orthotics;Therapeutic Activities;Visual/perceptual remediation/compensation;UE/LE Strength taining/ROM;Balance/vestibular training;Community reintegration;Disease management/prevention;Functional electrical stimulation;Neuromuscular re-education;Patient/family education;Skin care/wound management;Stair training;Therapeutic Exercise;UE/LE Coordination activities;Wheelchair propulsion/positioning PT Transfers Anticipated Outcome(s): supervision transfers PT Locomotion Anticipated Outcome(s): mod A short distance gait PT Recommendation Recommendations for Other Services: Speech consult;Therapeutic Recreation consult Therapeutic Recreation Interventions: Pet therapy;Kitchen group;Stress management;Outing/community reintergration Follow Up Recommendations: Home health PT;Outpatient PT Patient destination: Home Equipment Recommended: To be determined Equipment Details: likely w/c, owns RW   PT Evaluation Precautions/Restrictions Precautions Precautions: Fall Precaution Comments: L hemi (UE>LE), PICC R UE Restrictions Weight Bearing Restrictions: No General   Vital Signs Pain Pain Assessment Pain Scale: 0-10 Pain Score: 0-No pain Pain Interference Pain Interference Pain Effect on Sleep: 3. Frequently Pain Interference with Therapy Activities: 1. Rarely or not at all Pain Interference with Day-to-Day Activities: 1. Rarely or not at all Home Living/Prior [place] Available Help at Discharge: Family;Available 24 hours/day Type of Home: Apartment Home Access: Stairs to enter Entrance Stairs-Number of Steps: Pt lives on 3rd floor apartment with  multiple flights to access. Additional Comments: Patient reported plan is to transfer to grandfathers house with 3 steps to enter with family support at time of d/c. patient reported that possiblity of ramp to enter was available if needed after CIR. patient is very motivated to transition to CIR at this time.  Lives With: Family Prior Function Level of Independence: Requires assistive device for independence  Able to Take Stairs?: Yes Driving: Yes Vocation: Full time employment Vision/Perception  Vision - History Ability to See in Adequate Light: 1 Impaired Vision - Assessment Additional Comments: left visual field cut, very minimal peripheral vision on L side Perception Perception: Impaired Praxis Praxis: Impaired  Cognition Overall Cognitive Status: Within Functional Limits for tasks assessed Arousal/Alertness: Awake/alert Orientation Level: Oriented X4 Year: 2023 Month: January Day of Week: Correct Sensation Sensation Light Touch: (P) Appears Intact Proprioception: (P) Appears Intact Stereognosis: (P) Not tested Coordination Gross Motor Movements are Fluid and Coordinated: (P) No Coordination and Movement Description: (P) Grossly uncoordianted d/ mult strokes Motor  Motor Motor: Hemiplegia Motor - Skilled Clinical Observations: L hemi   Trunk/Postural Assessment  Cervical Assessment Cervical Assessment: Within Functional Limits Thoracic Assessment Thoracic Assessment: Within Functional Limits Lumbar Assessment Lumbar Assessment: Within Functional Limits Postural Control Postural Control: Within Functional Limits  Balance Balance Balance Assessed: Yes Standardized Balance Assessment Standardized Balance Assessment: PASS Postural Assessment Scale for Stroke Patients=PASS 1. Sitting Without Support: Can sit with slight support (for example, by 1 hand) 2. Standing With Support: Can stand with moderate support of 1 person 3. Standing Without Support: Can stand  without support for 10 seconds or leans leavily on 1 leg 4.Standing on Nonparetic Leg: Can stand on nonparetic leg for a few seconds 5.Standing on Paretic Leg: Cannot stand on paretic leg MAINTAINING POSTURE SUBTOTAL: 5 6. Supine to Paretic Side Lateral: Can perform with little help 7. Supine to Nonparetic Side Lateral: Can perform with little help 8. Supine to Sitting Up on the Edge of the Mat: Can perform with much help 9. Sitting on the Edge of the Mat to Supine: Can perform with much help 10. Sitting to Standing Up: Can perform with much help 11. Standing Up to Sitting Down: Can perform with little help 12. Standing,Picking Up a Pencil from the Floor: Cannot perform CHANGING POSTURE  SUBTOTAL: 9 PASS TOTAL SCORE: 14 Dynamic Sitting Balance Sitting balance - Comments: patient was able to self correct sitting balance with min guard. Extremity Assessment    LUE Assessment LUE Assessment: Exceptions to [facility] Passive Range of Motion (PROM) Comments: limited PROM 2/2 increased tone from old L hemi General Strength Comments: roughly 2/5 limited by old L hemi RLE Assessment RLE Assessment: Exceptions to [facility] General Strength Comments: grossly 3+/5 LLE Assessment LLE Assessment: Exceptions to [facility] General Strength Comments: grossly 3+/5  Care Tool Care Tool Bed Mobility Roll left and right activity   Roll left and right assist level: Maximal Assistance - Patient 25 - 49%    Sit to lying activity   Sit to lying assist level: Maximal Assistance - Patient 25 - 49%    Lying to sitting on side of bed activity   Lying to sitting on side of bed assist level: the ability to move from lying on the back to sitting on the side of the bed with no back support.: Maximal Assistance - Patient 25 - 49%     Care Tool Transfers Sit to stand transfer   Sit to stand assist level: Moderate Assistance - Patient 50 - 74%    Chair/bed transfer   Chair/bed transfer assist level: Maximal Assistance -  Patient 25 - 49%     Psychologist, counselling transfer activity did not occur: Safety/medical concerns        Care Tool Locomotion Ambulation Ambulation activity did not occur: Safety/medical concerns        Walk 10 feet activity Walk 10 feet activity did not occur: Safety/medical concerns       Walk 50 feet with 2 turns activity Walk 50 feet with 2 turns activity did not occur: Safety/medical concerns      Walk 150 feet activity Walk 150 feet activity did not occur: Safety/medical concerns      Walk 10 feet on uneven surfaces activity Walk 10 feet on uneven surfaces activity did not occur: Safety/medical concerns      Stairs Stair activity did not occur: Safety/medical concerns        Walk up/down 1 step activity Walk up/down 1 step or curb (drop down) activity did not occur: Safety/medical concerns      Walk up/down 4 steps activity Walk up/down 4 steps activity did not occur: Safety/medical concerns      Walk up/down 12 steps activity Walk up/down 12 steps activity did not occur: Safety/medical concerns      Pick up small objects from floor Pick up small object from the floor (from standing position) activity did not occur: Safety/medical concerns      Wheelchair Is the patient using a wheelchair?: Yes Type of Wheelchair: Manual   Wheelchair assist level: Dependent - Patient 0%    Wheel 50 feet with 2 turns activity Wheelchair 50 feet with 2 turns activity did not occur: Safety/medical concerns    Wheel 150 feet activity Wheelchair 150 feet activity did not occur: Safety/medical concerns      Refer to Care Plan for Long Term Goals  SHORT TERM GOAL WEEK 1 PT Short Term Goal 1 (Week 1): Pt will transfer with mod A or better with LRAD PT Short Term Goal 2 (Week 1): Pt will initiate gait training PT Short Term Goal 3 (Week 1): Pt will initiate w/c propulsion  Recommendations for other services: Therapeutic Recreation  Pet therapy and Stress  management  Skilled Therapeutic Intervention Evaluation completed (see details above and below) with education on PT POC and goals and individual treatment initiated with focus on  functional transfers. pt received in bed and agreeable to therapy. No complaint of pain. Completed subjective exam as documented, but pt is questionable historian. Pt requires max a for supine to sit at this time. Pt sat EOB for several minutes with min support, no LOB. Sit to stand x 3 with RW, mod A. Stand pivot transfer to recliner with max A and RW, maintaining flexed hip, knee, and trunk posture. Pt remained in recliner and was left with all needs in reach and alarm active.   Session 2: pt received in bed and agreeable to therapy. No complaint of pain. Pt with ripped brief, so dependent brief change with mod A rolling. Max A sup<>sit this session with max VC for technique and sequencing. Sit to stand x 4 during session with mod A, VC required for hand placement each time. Max A Stand pivot transfer to w/c. Pt taken throughout unit and educated on rehab processes and POC. MMT taken in sitting as seen below. Pt then returned to bed at end of session in the same manner and was left with all needs in reach and alarm active.   Mobility Bed Mobility Bed Mobility: Supine to Sit Supine to Sit: Maximal Assistance - Patient - Patient 25-49% Transfers Transfers: Sit to Stand;Stand Pivot Transfers Sit to Stand: Moderate Assistance - Patient 50-74% Stand Pivot Transfers: Maximal Assistance - Patient 25 - 49% Stand Pivot Transfer Details: Verbal cues for precautions/safety;Verbal cues for safe use of DME/AE;Manual facilitation for placement;Tactile cues for weight beaing Transfer (Assistive device): Rolling walker Locomotion  Gait Ambulation: No Gait Gait: No Stairs / Additional Locomotion Stairs: No Wheelchair Mobility Wheelchair Mobility: Yes Wheelchair Assistance: Dependent - Patient 0% (Pt needs assistance at time of  eval) Wheelchair Parts Management: Needs assistance   Discharge Criteria: Patient will be discharged from PT if patient refuses treatment 3 consecutive times without medical reason, if treatment goals not met, if there is a change in medical status, if patient makes no progress towards goals or if patient is discharged from hospital.  The above assessment, treatment plan, treatment alternatives and goals were discussed and mutually agreed upon: by patient  Mickel Fuchs 02/27/2021, 11:01 AM

## 2021-02-27 NOTE — Progress Notes (Signed)
Inpatient Rehabilitation Center Individual Statement of Services  Patient Name:  Lori [name]  Date:  02/27/2021  Welcome to the [facility].  Our goal is to provide you with an individualized program based on your diagnosis and situation, designed to meet your specific needs.  With this comprehensive rehabilitation program, you will be expected to participate in at least 3 hours of rehabilitation therapies Monday-Friday, with modified therapy programming on the weekends.  Your rehabilitation program will include the following services:  Physical Therapy (PT), Occupational Therapy (OT), Speech Therapy (ST), 24 hour per day rehabilitation nursing, Therapeutic Recreaction (TR), Neuropsychology, Care Coordinator, Rehabilitation Medicine, Nutrition Services, and Pharmacy Services  Weekly team conferences will be held on Tuesday to discuss your progress.  Your Inpatient Rehabilitation Care Coordinator will talk with you frequently to get your input and to update you on team discussions.  Team conferences with you and your family in attendance may also be held.  Expected length of stay: 18-21 days  Overall anticipated outcome: min assist level  Depending on your progress and recovery, your program may change. Your Inpatient Rehabilitation Care Coordinator will coordinate services and will keep you informed of any changes. Your Inpatient Rehabilitation Care Coordinator's name and contact numbers are listed  below.  The following services may also be recommended but are not provided by the [place]:   [place] will be made to provide these services after discharge if needed.  Arrangements include referral to agencies that provide these services.  Your insurance has been verified to be:  Medicare & medicaid-[employer] Your primary doctor is:  None  Pertinent information  will be shared with your doctor and your insurance company.  Inpatient Rehabilitation Care Coordinator:  Ovidio Kin, [place] or Emilia Beck  Information discussed with and copy given to patient by: Elease Hashimoto, 02/27/2021, 3:11 PM

## 2021-02-27 NOTE — Plan of Care (Signed)
°  Problem: RH Balance Goal: LTG: Patient will maintain dynamic sitting balance (OT) Description: LTG:  Patient will maintain dynamic sitting balance with assistance during activities of daily living (OT) Flowsheets (Taken 02/27/2021 1251) LTG: Pt will maintain dynamic sitting balance during ADLs with: Supervision/Verbal cueing Goal: LTG Patient will maintain dynamic standing with ADLs (OT) Description: LTG:  Patient will maintain dynamic standing balance with assist during activities of daily living (OT)  Flowsheets (Taken 02/27/2021 1251) LTG: Pt will maintain dynamic standing balance during ADLs with: Minimal Assistance - Patient > 75%   Problem: Sit to Stand Goal: LTG:  Patient will perform sit to stand in prep for activites of daily living with assistance level (OT) Description: LTG:  Patient will perform sit to stand in prep for activites of daily living with assistance level (OT) Flowsheets (Taken 02/27/2021 1251) LTG: PT will perform sit to stand in prep for activites of daily living with assistance level: Contact Guard/Touching assist   Problem: RH Eating Goal: LTG Patient will perform eating w/assist, cues/equip (OT) Description: LTG: Patient will perform eating with assist, with/without cues using equipment (OT) Flowsheets (Taken 02/27/2021 1251) LTG: Pt will perform eating with assistance level of: Set up assist    Problem: RH Grooming Goal: LTG Patient will perform grooming w/assist,cues/equip (OT) Description: LTG: Patient will perform grooming with assist, with/without cues using equipment (OT) Flowsheets (Taken 02/27/2021 1251) LTG: Pt will perform grooming with assistance level of: Supervision/Verbal cueing   Problem: RH Bathing Goal: LTG Patient will bathe all body parts with assist levels (OT) Description: LTG: Patient will bathe all body parts with assist levels (OT) Flowsheets (Taken 02/27/2021 1251) LTG: Pt will perform bathing with assistance level/cueing: Minimal  Assistance - Patient > 75%   Problem: RH Dressing Goal: LTG Patient will perform upper body dressing (OT) Description: LTG Patient will perform upper body dressing with assist, with/without cues (OT). Flowsheets (Taken 02/27/2021 1251) LTG: Pt will perform upper body dressing with assistance level of: Minimal Assistance - Patient > 75% Goal: LTG Patient will perform lower body dressing w/assist (OT) Description: LTG: Patient will perform lower body dressing with assist, with/without cues in positioning using equipment (OT) Flowsheets (Taken 02/27/2021 1251) LTG: Pt will perform lower body dressing with assistance level of: Minimal Assistance - Patient > 75%   Problem: RH Toileting Goal: LTG Patient will perform toileting task (3/3 steps) with assistance level (OT) Description: LTG: Patient will perform toileting task (3/3 steps) with assistance level (OT)  Flowsheets (Taken 02/27/2021 1251) LTG: Pt will perform toileting task (3/3 steps) with assistance level: Minimal Assistance - Patient > 75%   Problem: RH Toilet Transfers Goal: LTG Patient will perform toilet transfers w/assist (OT) Description: LTG: Patient will perform toilet transfers with assist, with/without cues using equipment (OT) Flowsheets (Taken 02/27/2021 1251) LTG: Pt will perform toilet transfers with assistance level of: Minimal Assistance - Patient > 75%

## 2021-02-27 NOTE — Progress Notes (Signed)
Inpatient Rehabilitation  Patient information reviewed and entered into eRehab system by Jamiracle Avants M. Leonard Feigel, M.A., CCC/SLP, PPS Coordinator.  Information including medical coding, functional ability and quality indicators will be reviewed and updated through discharge.    

## 2021-02-27 NOTE — Progress Notes (Signed)
Occupational Therapy Session Note  Patient Details  Name: Lori [name] MRN: [id number] Date of Birth: 1970-10-05  Today's Date: 02/27/2021 OT Individual Time: 1530-1609 OT Individual Time Calculation (min): 39 min    Short Term Goals: Week 1:  OT Short Term Goal 1 (Week 1): Pt will transfer to BSC/toilet with 1 assist and LRAD OT Short Term Goal 2 (Week 1): Pt will don shirt with MOD A with hemitechnique OT Short Term Goal 3 (Week 1): Pt will don pants over hips in standing with MOD A  Skilled Therapeutic Interventions/Progress Updates:  Pt greeted at time of session semireclined in bed resting, no pain and agreeable to OT session. Pt performing bed mobility with Max A to sit EOB, decreased cuing in attempt to have pt initiate but still needing Max A eventually. Sit > stand Mod A and stand pivot to wheelchair Mod/Max with tactile cues to prevent knee buckle. Set up at sink for UB bathing per pt request as AM session was not able to do thoroughly, needing Mod/max A for thoroughness and instruction to place LUE on sink surface to wash under arm pit and one handed techniques. UB dress gown only with Max A. Stand pivot back to bed same as above. Alarm on call bell in reach.    Therapy Documentation Precautions:  Precautions Precautions: Fall Precaution Comments: L hemi (UE>LE), PICC R UE Restrictions Weight Bearing Restrictions: No     Therapy/Group: Individual Therapy  [name] 02/27/2021, 4:09 PM

## 2021-02-27 NOTE — Progress Notes (Signed)
PROGRESS NOTE   Subjective/Complaints:  Pt reports no issues, but when directly asked, LBM 2-3 days ago and feels really constipated-  Willing to try Sorbitol.     ROS: Limited by cognition   Objective:   No results found. Recent Labs    02/26/21 0632 02/27/21 0612  WBC 7.1 6.8  HGB 11.7* 11.3*  HCT 35.6* 34.4*  PLT 502* 502*   Recent Labs    02/26/21 0632 02/27/21 0612  NA 131* 136  K 4.4 4.3  CL 100 103  CO2 24 25  GLUCOSE 247* 196*  BUN 18 12  CREATININE 0.73 0.76  CALCIUM 8.8* 8.6*    Intake/Output Summary (Last 24 hours) at 02/27/2021 1033 Last data filed at 02/27/2021 0730 Gross per 24 hour  Intake 356 ml  Output --  Net 356 ml        Physical Exam: Vital Signs Blood pressure (!) 147/98, pulse 92, temperature 98.1 F (36.7 C), resp. rate 16, height 5\' 2"  (1.575 m), weight 94.8 kg, SpO2 99 %.     General: awake, alert, appropriate,laying supine in bed;  NAD HENT: conjugate gaze; oropharynx moist CV: regular rate and rhythm; no JVD Pulmonary: no W/R/R- but a little coarse- mainly upper airway sounds GI: a little firm, but not hard- NT; but somewhat distended; hypoactive BS Psychiatric: appropriate Neurological: alert, but communication limited due to delayed responses Musculoskeletal: has hyperextension of multiple fingers on L hand at rest    Cervical back: Normal range of motion and neck supple.  Skin:    General: Skin is warm and dry.  Neurological:     Mental Status: She is alert.     Comments: Dysconjugate gaze. Slow to initiate and process. Language is deliberate. Able to communicate thoughts.    Psychiatric:     Comments: Cooperative but flat  RUE and RLE grossly 4- to 4/5. LUE 3/5 at shoulder,elbow but limited d/t flexor contracture and hypertonicity at left wrist/fingers   RLE 4/5. LLE 3 to 3+/5.  Hyperreflexic on left.  Also with mild inattention to left.    Assessment/Plan: 1. Functional deficits which require 3+ hours per day of interdisciplinary therapy in a comprehensive inpatient rehab setting. Physiatrist is providing close team supervision and 24 hour management of active medical problems listed below. Physiatrist and rehab team continue to assess barriers to discharge/monitor patient progress toward functional and medical goals  Care Tool:  Bathing              Bathing assist       Upper Body Dressing/Undressing Upper body dressing   What is the patient wearing?: Hospital gown only    Upper body assist Assist Level: Moderate Assistance - Patient 50 - 74%    Lower Body Dressing/Undressing Lower body dressing      What is the patient wearing?: Incontinence brief     Lower body assist Assist for lower body dressing: Moderate Assistance - Patient 50 - 74%     Toileting Toileting    Toileting assist       Transfers Chair/bed transfer  Transfers assist           Locomotion Ambulation  Ambulation assist              Walk 10 feet activity   Assist           Walk 50 feet activity   Assist           Walk 150 feet activity   Assist           Walk 10 feet on uneven surface  activity   Assist           Wheelchair     Assist               Wheelchair 50 feet with 2 turns activity    Assist            Wheelchair 150 feet activity     Assist          Blood pressure (!) 147/98, pulse 92, temperature 98.1 F (36.7 C), resp. rate 16, height 5\' 2"  (1.575 m), weight 94.8 kg, SpO2 99 %.  Medical Problem List and Plan: 1. 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  Antithrombotics: -DVT/anticoagulation:  Pharmaceutical: Lovenox             -antiplatelet therapy: Plavix.  3. Pain Management/spasticity: Tylenol prn.              -ROM with therapy             -consider botox, defer to primary team 4. Mood: LCSW to follow for evaluation and support.              -antipsychotic agents: N/A 5. Neuropsych: This patient may be capable of making decisions on her own behalf.             -will need to investigate further with family input.  6. Skin/Wound Care:  Routine pressure relief measures.  7. Fluids/Electrolytes/Nutrition: Monitor I/O. Check CMET in am. 8. T2DM: Hgb A1c-9.0 poorly controlled. Has been on insulin for the past year.              --came in with DKA -->on Glucerna TID BETWEEN meals. Will change to lower carb Ensure max with juven for low protein stores.              --Continue Insulin gargline with 25 units at night and 3 units TIED with meals  1/24- will monitor for trend- is elevated- and change as of tomorrow 9.  Hyperthyroid: Recent diagnosis and now on Tapazole             --continue to monitor HR TID.  10. Gastroparesis?: Change reglan to po route.             --monitor for TDK 11. H/o anxiety/depression: Continue Lexapro with ativan at bedtime.             --melatonin for sleep wake disruption.  12. Hyponatremia: Monitor Sodium level with serial checks as trending down.              --1/25- Up to 136 today - will monitor  13. HTN: SBP goal 130-150 due to  severe intracranial atherosclerosis.  --Monitor BP TID. Continue Zestril.   1/24- BP 147/98 this AM- in the range recommended, so will not make changes 14. Constipation  1/24- will give sorbitol after therapy and monitor   I spent a total of  36  minutes on total care today- >50% coordination of care- due to team conference and discussing her with PA's.        LOS: 1 days A FACE TO FACE EVALUATION WAS PERFORMED  Finnian Husted 02/27/2021, 10:33 AM

## 2021-02-28 LAB — GLUCOSE, CAPILLARY
Glucose-Capillary: 194 mg/dL — ABNORMAL HIGH (ref 70–99)
Glucose-Capillary: 249 mg/dL — ABNORMAL HIGH (ref 70–99)
Glucose-Capillary: 98 mg/dL (ref 70–99)
Glucose-Capillary: 233 mg/dL — ABNORMAL HIGH (ref 70–99)

## 2021-02-28 NOTE — Progress Notes (Signed)
Physical Therapy Session Note  Patient Details  Name: Lori [name] MRN: [id number] Date of Birth: 25-Nov-1970  Today's Date: 02/28/2021 PT Individual Time: 0815-0930, 0626-9485 PT Individual Time Calculation (min): 75 min, 58 min   Short Term Goals: Week 1:  PT Short Term Goal 1 (Week 1): Pt will transfer with mod A or better with LRAD PT Short Term Goal 2 (Week 1): Pt will initiate gait training PT Short Term Goal 3 (Week 1): Pt will initiate w/c propulsion  Skilled Therapeutic Interventions/Progress Updates:    Session 1: pt received in bed and agreeable to therapy. No complaint of pain. Nurse tech present for brief change. Pt participated in rolling min A both directions with VC. Therapist facilitated sup>sit with flexion/rotation technique, min A to complete trunk motion. Pt sat EOB x ~15 while washing and performing morning ADLs for trunk control and sitting balance without UE support.   Session focused on functional transfers and standing. Sit to stand from various surfaces as little as min A with VC for technique, but as much as max A with fatigue at end of session. Stand pivot transfer x 3 EOB<>w/c<>commode with as much as max A d/t fatigue and bowel urgency, as little as min A and heavy VC.  Pt transported to therapy gym for time management and energy conservation. The following performed in //bars with VC for technique throughout: Sit to stand with facilitation, pt demoing some carryover of hand placement at this time. Standing marches for forced use of BLE and with multimodal cueing for glute activation. Pre gait stepping for forced use and BLE strength, pt reporting L knee pain with this activity. Denies intervention until end of session to ask RN for flexeril for pain. Pt then stepped forward and back ~ 1 ft with same level of assist. Knee block provided throughout, pt tends to fatigue and sink into squat/sitting position with fatigue.   Pt reported bowel urge, so transported  back to room. Mod A Stand pivot transfer <> commode. Dependent for 3/3 toileting tasks in standing.continent small BM/smear. Doffed pants in standing, dependent. Pt returned to bed in with max A d/t fatigue and was left with all needs in reach and alarm active.   Session 2: Pt seated in w/c on arrival and agreeable to therapy. No complaint of pain but very sleepy during session. Pt transported to therapy gym for time management and energy conservation. Pt participated in kinetron for posterior chain strength 4 x 1 min at 20 cm/sec. Transitioned to horseshoes in standing for dynamic balance and standing tolerance. Sit to stand with as little as CGA at times, VC still required for hand placement. Pt performed several reps of reaching L hand to chest level, but had difficulty with possible spasticity L hand pulling into finger flexion. Pt transported back to room and returned to bed with max a d/t fatigue. Several Sit to stand to RW with mod A to assist changing sheets. Pt was left with all needs in reach and alarm active.   Therapy Documentation Precautions:  Precautions Precautions: Fall Precaution Comments: L hemi (UE>LE), PICC R UE Restrictions Weight Bearing Restrictions: No General:      Therapy/Group: Individual Therapy  Mickel Fuchs 02/28/2021, 9:17 AM

## 2021-02-28 NOTE — Discharge Instructions (Addendum)
Inpatient Rehab Discharge Instructions  [facility] Discharge date and time:    Activities/Precautions/ Functional Status: Activity: no lifting, driving, or strenuous exercise till cleared by MD Diet: cardiac diet and diabetic diet Wound Care: none needed   Functional status:  ___ No restrictions     ___ Walk up steps independently _X__ 24/7 supervision/assistance   ___ Walk up steps with assistance ___ Intermittent supervision/assistance  ___ Bathe/dress independently ___ Walk with walker     ___ Bathe/dress with assistance ___ Walk Independently    ___ Shower independently ___ Walk with assistance    _X__ Shower with assistance _X__ No alcohol     ___ Return to work/school ________   Special Instructions:     COMMUNITY REFERRALS UPON DISCHARGE:    Home Health:   PT  OT  [facility] [place] Phone: [phone]   Medical Equipment/Items Ordered:  TUB BENCH HAS [facility] FROM PAST ADMISSION                                                 Agency/Supplier:[facility]   [place] [phone] EXT [phone]  My questions have been answered and I understand these instructions. I will adhere to these goals and the provided educational materials after my discharge from the hospital.  Patient/Caregiver Signature _______________________________ Date __________  Clinician Signature _______________________________________ Date __________  Please bring this form and your medication list with you to all your follow-up doctor's appointments.

## 2021-02-28 NOTE — Progress Notes (Signed)
Patient ID: Lori [name], female   DOB: [birth year], 51 y.o.   MRN: [id number]  Met with pt to let her know have not spoken with either husband or daughter, messages left for both with no return call. Pt reports her daughter is coming to see her today. Have asked her to give her my card and ask her to call me. She will. Discussed again she will need care at discharge and they will need to begin on the ramp she will need to get into her grandfather's home. Will see if she calls me or if husband call back

## 2021-02-28 NOTE — Progress Notes (Signed)
Occupational Therapy Session Note  Patient Details  Name: Mylia Pondexter MRN: [id number] Date of Birth: [birth year]  Today's Date: 02/28/2021 OT Individual Time: 1100-1155 OT Individual Time Calculation (min): 55 min    Short Term Goals: Week 1:  OT Short Term Goal 1 (Week 1): Pt will transfer to BSC/toilet with 1 assist and LRAD OT Short Term Goal 2 (Week 1): Pt will don shirt with MOD A with hemitechnique OT Short Term Goal 3 (Week 1): Pt will don pants over hips in standing with MOD A   Skilled Therapeutic Interventions/Progress Updates:    Pt greeted at time of session semireclined bed level stating "I need to be changed." Educated on importance of trying to call before using bathroom in brief, pt stating it happens suddenly and does not have time. Discussion regarding use of pull ups at home and being able to change them in the bathroom for incontinence as well as timed toileting. Brief change bed level total A with pt able to roll with Mod A overall extended time and use of bed rails. Supine > sit Max A with step by step verbal and tactile cues. Sit > stand at EOB Mod A and able to don pants Max A, assist threading and pulling over hips in standing before stand pivot to wheelchair. Set up at sink and oral hygiene supervision and extended time. Pt transported to day room and stand pivot wheelchair <> mat with Mod A and RW, cues to fully turn prior to sitting for safety. Focused on core strengthening with various reaching/bending tasks reaching for 2 rounds of 8 horseshoes and locating 11/11 colored/numbered dots. Pt also having to reach with LUE and translate to R hand. Stand pivot back to wheelchair, transported back to room and set up with alarm on call bell in reach and NT checking blood sugar.   Therapy Documentation Precautions:  Precautions Precautions: Fall Precaution Comments: L hemi (UE>LE), PICC R UE Restrictions Weight Bearing Restrictions: No     Therapy/Group:  Individual Therapy  [name] 02/28/2021, 7:22 AM

## 2021-03-01 LAB — GLUCOSE, CAPILLARY
Glucose-Capillary: 157 mg/dL — ABNORMAL HIGH (ref 70–99)
Glucose-Capillary: 196 mg/dL — ABNORMAL HIGH (ref 70–99)
Glucose-Capillary: 121 mg/dL — ABNORMAL HIGH (ref 70–99)
Glucose-Capillary: 226 mg/dL — ABNORMAL HIGH (ref 70–99)

## 2021-03-01 MED ORDER — INSULIN ASPART 100 UNIT/ML IJ SOLN
4.0000 [IU] | Freq: Three times a day (TID) | INTRAMUSCULAR | Status: DC
  Administered 2021-03-01 – 2021-03-16 (×32): 4 [IU] via SUBCUTANEOUS

## 2021-03-01 NOTE — Evaluation (Signed)
Speech Language Pathology Assessment and Plan  Patient Details  Name: Lori [name] MRN: [id number] Date of Birth: Mar 22, 1970  SLP Diagnosis: Dysphagia;Cognitive Impairments  Rehab Potential: Good ELOS: 2/10   Today's Date: 03/01/2021 SLP Individual Time: 1100-1200 SLP Individual Time Calculation (min): 60 min  Hospital Problem: Principal Problem:   Debility  Past Medical History:  Past Medical History:  Diagnosis Date   CAD (coronary artery disease)    Depression    Diabetes mellitus without complication ([place])    Hypertension    Pheochromocytoma    Pheochromocytoma    Stroke ([place])    Thyroid disease    Past Surgical History:  Past Surgical History:  Procedure Laterality Date   CORONARY ARTERY BYPASS GRAFT     Right adrenal gland removal for pheochromocytoma Right     Assessment / Plan / Recommendation Clinical Impression  Lori [name] is a 51 year old RH-female with history of T2DM, HTN, multiple prior strokes-[most significant last Jan with aphasia and right sided symptoms and then left hemiplegia since 10/22. She was recently admitted to [facility] on 01/19/21 with inability to speak with BLE weakness and projectile vomiting. CTA head/neck showed atherosclerosis most severe in PCA with L-VA and BA stenosis and possible flow limiting stenosis in right carotid siphon. MRI brain showed showed multiple old infarcts with moderate volume loss and not acute changes. Dr. Quinn [name] felt patient with recrudescence of symptoms in setting off GI illness and recommended SBP goal 130-150 given severe intracranial stenosis. (Patient familiar to Dr. Quinn [name] from [facility]). Also reports of brain Ca resection in the past. She was also found to have tachycardia due to hyperthyroidism and was started on tapazole.    Her verbal output was improving but continued to be limited by Left hemiplegia with left field cut. She was discharged to [facility] for rehab but has had repeat admission for N/V and  noted to be in DKA on most recent admission to [facility] on 02/19/21. She was treated with IV insulin, IVF and IV Cardizem for rate control.  DKA has resolved and insulin being titrated for better control. N/V has resolved with IV reglan. Therapy ongoing and patient showing good participation and eager to return to prior level of function. CIR recommended due to functional decline.  Patient transferred to CIR on 02/26/2021.   Pt presents with mild cognitive impairment as evidenced from a SLUMS score of 23/26 (adjusted to omit clock drawing 2' inability to write). Most significant deficits in short term recall, mental flexibility and executive function. Pt is oriented x4 and demonstrates adequate understanding of recent medical events. She does demonstrate decreased awareness of functional deficits as well as difficulties with error awareness/repair. Pt is likely baseline function for cognition d/t multiple CVAs in the past, however her goal is to discharge home, therefore treatment to target cognitive function during CIR would be beneficial.   Pt seen with regular lunch meal to determine diet tolerance. Pt with extended mastication of all regular solids (no significant difference between D2, D3 and regular) demonstrating oral phase impairment. Swallow initiation appears timely with solids, liquid was effective in clearing oral stasis. Pt seen with thin liquid via cup and straw with no apparent difficulties or s/s aspiration. Pt does state she has difficulty with crunchy solids and avoids them, was tolerating a regular/thin diet at baseline (she states she ordered [employer] from restaurants daily at [facility]). Recommend continuing regular/thin diet with pt encouraged to order items/textures that she prefers.   Expressive and  Receptive language judged to be [facility]. Pt does demonstrate mild dysarthria, c/b imprecise articulation, low vocal intensity and some nasal emission, however this is her baseline speech following previous  CVAs. Pt is 90-100% intelligible at conversation level. Pt will benefit from Skilled ST in CIR to increase safety and independence with daily routine and consume safest and least restrictive diet. Recommend 24/7 supervision if discharging home with continued ST services to maximize cognitive function and swallow safety.    Skilled Therapeutic Interventions          Pt participating in Clinical Swallow Evaluation, SLUMS and further non standardized assessments of speech, language and cognition. Please see above.   SLP Assessment  Patient will need skilled Speech Lanaguage Pathology Services during CIR admission    Recommendations  SLP Diet Recommendations: Age appropriate regular solids;Thin Liquid Administration via: Cup;Straw Medication Administration: Whole meds with puree Supervision: Patient able to self feed Compensations: Minimize environmental distractions;Slow rate;Small sips/bites;Follow solids with liquid Postural Changes and/or Swallow Maneuvers: Seated upright 90 degrees Oral Care Recommendations: Oral care BID Recommendations for Other Services: Neuropsych consult Patient destination: Home Follow up Recommendations: Home Health SLP;24 hour supervision/assistance Equipment Recommended: None recommended by SLP    SLP Frequency 3 to 5 out of 7 days   SLP Duration  SLP Intensity  SLP Treatment/Interventions 2/10  Minumum of 1-2 x/day, 30 to 90 minutes  Cognitive remediation/compensation;Cueing hierarchy;Dysphagia/aspiration precaution training;Functional tasks;Internal/external aids;Patient/family education;Therapeutic Activities;Therapeutic Exercise    Pain Pain Assessment Pain Scale: 0-10 Pain Score: 0-No pain  Prior Functioning Cognitive/Linguistic Baseline: Baseline deficits Baseline deficit details: multiple CVAs Type of Home: Apartment  Lives With: Family Available Help at Discharge: Family;Available PRN/intermittently Education: bachelors Vocation: Full time  employment  SLP Evaluation Cognition Overall Cognitive Status: Impaired/Different from baseline Arousal/Alertness: Awake/alert Orientation Level: Oriented X4 Year: 2023 Month: January Day of Week: Correct Attention: Focused;Sustained Focused Attention: Appears intact Sustained Attention: Appears intact Memory: Impaired Memory Impairment: Decreased recall of new information;Storage deficit Awareness: Impaired Awareness Impairment: Emergent impairment;Anticipatory impairment Problem Solving: Impaired Problem Solving Impairment: Verbal complex;Functional complex Executive Function: Self Monitoring;Self Correcting Self Monitoring: Impaired Self Monitoring Impairment: Verbal complex;Functional complex Self Correcting: Impaired Self Correcting Impairment: Verbal complex;Functional complex Safety/Judgment: Impaired  Comprehension Auditory Comprehension Overall Auditory Comprehension: Appears within functional limits for tasks assessed Yes/No Questions: Within Functional Limits Commands: Within Functional Limits Conversation: Simple Expression Expression Primary Mode of Expression: Verbal Verbal Expression Overall Verbal Expression: Appears within functional limits for tasks assessed Oral Motor Oral Motor/Sensory Function Overall Oral Motor/Sensory Function: Mild impairment Facial ROM: Reduced left Facial Symmetry: Abnormal symmetry left Facial Strength: Within Functional Limits Lingual ROM: Suspected CN XII (hypoglossal) dysfunction Lingual Symmetry: Within Functional Limits Lingual Strength: Reduced;Suspected CN XII (hypoglossal) dysfunction Lingual Sensation: Within Functional Limits Velum: Within Functional Limits Mandible: Within Functional Limits Motor Speech Overall Motor Speech: Impaired Resonance:  (nasal emissions) Articulation: Impaired Level of Impairment: Phrase Intelligibility: Intelligible Motor Planning: Witnin functional limits Effective Techniques:  Over-articulate;Pacing  Care Tool Care Tool Cognition Ability to hear (with hearing aid or hearing appliances if normally used Ability to hear (with hearing aid or hearing appliances if normally used): 1. Minimal difficulty - difficulty in some environments (e.g. when person speaks softly or setting is noisy)   Expression of Ideas and Wants Expression of Ideas and Wants: 3. Some difficulty - exhibits some difficulty with expressing needs and ideas (e.g, some words or finishing thoughts) or speech is not clear   Understanding Verbal and Non-Verbal Content Understanding Verbal and Non-Verbal Content:  3. Usually understands - understands most conversations, but misses some part/intent of message. Requires cues at times to understand  Memory/Recall Ability Memory/Recall Ability : Current season;That he or she is in a hospital/hospital unit   PMSV Trial Intelligibility: Intelligible  Bedside Swallowing Assessment General Date of Onset: 02/19/21 Previous Swallow Assessment: MBS 01-23-21 showed silent aspiration of thin, 01/31/2021 trace silent aspiration of thin Diet Prior to this Study: Regular;Thin liquids Temperature Spikes Noted: No Respiratory Status: Room air History of Recent Intubation: No Behavior/Cognition: Alert;Cooperative;Pleasant mood Oral Cavity - Dentition: Missing dentition Self-Feeding Abilities: Needs set up Vision: Functional for self-feeding Patient Positioning: Upright in chair/Tumbleform Volitional Cough: Strong Volitional Swallow: Able to elicit  Ice Chips Ice chips: Not tested Thin Liquid Thin Liquid: Within functional limits Presentation: Straw Nectar Thick Nectar Thick Liquid: Not tested Honey Thick Honey Thick Liquid: Not tested Puree Puree: Impaired Presentation: Self Fed;Spoon Oral Phase Functional Implications: Prolonged oral transit;Oral residue Solid Solid: Impaired Presentation: Self Fed Oral Phase Functional Implications: Prolonged oral  transit;Oral residue BSE Assessment Risk for Aspiration Other Related Risk Factors: Previous CVA  Short Term Goals: Week 1: SLP Short Term Goal 1 (Week 1): Pt will tolerate regular/thin with Supervision A cues for general swallow precautions. SLP Short Term Goal 2 (Week 1): Pt will recall novel and functional information with min A cues for use of external memory aids SLP Short Term Goal 3 (Week 1): Pt will complete mildly complex problem solving tasks with min A cues for error awareness/repair SLP Short Term Goal 4 (Week 1): Pt will participate in simple alternating attention task with min A cues SLP Short Term Goal 5 (Week 1): Pt will complete medication management/organization task with min A cues  Refer to Care Plan for Long Term Goals  Recommendations for other services: Neuropsych  Discharge Criteria: Patient will be discharged from SLP if patient refuses treatment 3 consecutive times without medical reason, if treatment goals not met, if there is a change in medical status, if patient makes no progress towards goals or if patient is discharged from hospital.  The above assessment, treatment plan, treatment alternatives and goals were discussed and mutually agreed upon: by patient  Dewaine Conger 03/01/2021, 12:58 PM

## 2021-03-01 NOTE — Progress Notes (Signed)
Occupational Therapy Session Note  Patient Details  Name: Lori [name] MRN: [id number] Date of Birth: July 05, 1970  Today's Date: 03/01/2021 OT Individual Time: 6999-6722 OT Individual Time Calculation (min): 42 min    Short Term Goals: Week 1:  OT Short Term Goal 1 (Week 1): Pt will transfer to BSC/toilet with 1 assist and LRAD OT Short Term Goal 2 (Week 1): Pt will don shirt with MOD A with hemitechnique OT Short Term Goal 3 (Week 1): Pt will don pants over hips in standing with MOD A   Skilled Therapeutic Interventions/Progress Updates:    Pt greeted at time of session bed level finishing up toileting with NT, brief change bed level. OT resumed care and pt ADL needs met, wanting to work on standing balance instead. Supine > sit Mod A and stand pivot to wheelchair Min A with RW, cues for hand placement and to fully turn before sitting, consistent with transfers throughout session. Transported to day room and focused on dynamic standing balance at high/low table with LUE support for weight bearing in hemi side and reaching with RUE for various colored pegs and placing in corresponding order, no mistakes and activity graded for balance. Pt able to stand for 30 sec - 1 minute intervals. Attempted marching in place as well, difficulty suppoting on LLE to lift RLE. Transported back to room and stand pivot to bed Min A. Set up with lunch tray and alarm on call bell in reach.   Therapy Documentation Precautions:  Precautions Precautions: Fall Precaution Comments: L hemi (UE>LE), PICC R UE Restrictions Weight Bearing Restrictions: No     Therapy/Group: Individual Therapy  [name] 03/01/2021, 7:26 AM

## 2021-03-01 NOTE — Progress Notes (Signed)
Patient ID: [name], female   DOB: [birth year], 51 y.o.   MRN: [id number] Have spoken with [name]-daughter via telephone to discuss team conference goals min assist and what is the discharge plan. She reports they will need to discuss she does not know if pt's husband will be assisting at discharge, he does live in [place]. [name] currently lives in [place] with her husband and three small children ( 5, 5 & 3 ) and with her two sister's ( 46 & 44). Both siblings are seniors in [place] but do go remotely. She also works remotely along with taking care of her children. When asked if they will be moving in with her great grandfather she reports they need to discuss this. Before when pt was home she was too much care for them and will continue to require care once again. [name] wants her home but is stretched with all of her responsibilities. Will work with all on the discharge plan. They need to discuss if feasible to move in with great grandfather. Will push them to have this discussion so an move forward with a realistic discharge plan.

## 2021-03-01 NOTE — IPOC Note (Signed)
Overall Plan of Care [facility]) Patient Details Name: Lori [name] MRN: [id number] DOB: 1970-05-17  Admitting Diagnosis: [facility] Problems: Principal Problem:   Debility     Functional Problem List: Nursing Pain, Endurance, Bladder, Bowel, Safety, Medication Management  PT Balance, Pain, Perception, Edema, Endurance, Sensory, Motor, Safety, Skin Integrity  OT Balance, Behavior, Cognition, Edema, Endurance, Motor, Perception, Safety, Sensory, Vision  SLP    TR         Basic ADLs: OT Eating, Grooming, Bathing, Dressing, Toileting     Advanced  ADLs: OT       Transfers: PT Bed Mobility, Bed to Chair, Car, Manufacturing systems engineer, Metallurgist: PT Ambulation, Emergency planning/management officer     Additional Impairments: OT Fuctional Use of Upper Extremity  SLP        TR      Anticipated Outcomes Item Anticipated Outcome  Self Feeding set up  Swallowing      Basic self-care  Min A  Toileting  Min A   Bathroom Transfers Min A  Bowel/Bladder  manage bowel with mod I and bladder with min assist  Transfers  supervision transfers  Locomotion  mod A short distance gait  Communication     Cognition     Pain  pain at or below level 4 with prn meds  Safety/Judgment  maintain safety with cues   Therapy Plan: PT Intensity: Minimum of 1-2 x/day ,45 to 90 minutes PT Frequency: 5 out of 7 days PT Duration Estimated Length of Stay: 18-21 days OT Intensity: Minimum of 1-2 x/day, 45 to 90 minutes OT Frequency: 5 out of 7 days OT Duration/Estimated Length of Stay: 16-18 days     Due to the current state of emergency, patients may not be receiving their 3-hours of Medicare-mandated therapy.   Team Interventions: Nursing Interventions Patient/Family Education, Bowel Management, Pain Management, Medication Management, Disease Management/Prevention, Bladder Management, Discharge Planning  PT interventions Ambulation/gait training, Cognitive  remediation/compensation, Discharge planning, DME/adaptive equipment instruction, Functional mobility training, Pain management, Psychosocial support, Splinting/orthotics, Therapeutic Activities, Visual/perceptual remediation/compensation, UE/LE Strength taining/ROM, Training and development officer, Community reintegration, Disease management/prevention, Functional electrical stimulation, Neuromuscular re-education, Patient/family education, Skin care/wound management, Stair training, Therapeutic Exercise, UE/LE Coordination activities, Wheelchair propulsion/positioning  OT Interventions Training and development officer, Discharge planning, Functional electrical stimulation, Self Care/advanced ADL retraining, Therapeutic Activities, UE/LE Coordination activities, Cognitive remediation/compensation, Disease mangement/prevention, Functional mobility training, Patient/family education, Skin care/wound managment, Therapeutic Exercise, Visual/perceptual remediation/compensation, Wheelchair propulsion/positioning, UE/LE Strength taining/ROM, Splinting/orthotics, Psychosocial support, Neuromuscular re-education, DME/adaptive equipment instruction, Community reintegration  SLP Interventions    TR Interventions    SW/CM Interventions Discharge Planning, Psychosocial Support, Patient/Family Education   Barriers to Discharge MD  Medical stability, Home enviroment access/loayout, Incontinence, Neurogenic bowel and bladder, Lack of/limited family support, Weight, Medication compliance, and Behavior  Nursing Decreased caregiver support, Home environment access/layout, Incontinence, Medication compliance, Weight home w spouse 3ste (ramp requested) has rollator  PT Inaccessible home environment, Decreased caregiver support, Incontinence, Lack of/limited family support, Weight    OT Inaccessible home environment, Home environment access/layout, Decreased caregiver support    SLP      SW Decreased caregiver support, Medication  compliance, Insurance for SNF coverage, Other (comments) Exhausted Medicare coverage and was in medicaid coverage difficult to find NH to take Medicaid-limited options   Team Discharge Planning: Destination: PT-Home ,OT- Home , SLP-  Projected Follow-up: PT-Home health PT, Outpatient PT, OT-  Home health OT, 24 hour supervision/assistance, SLP-  Projected Equipment Needs: PT-To be  determined, OT- To be determined, SLP-  Equipment Details: PT-likely w/c, owns RW, OT-  Patient/family involved in discharge planning: PT- Patient,  OT-Patient, SLP-   MD ELOS: 18-21 days Medical Rehab Prognosis:  Fair Assessment: Pt is a 51 yr old female with hx of previous strokes and L hemiparesis/aphasia- also admited with DKA- A1c 9.0- and eats sweets all the time.  Also has gastroparesis- will remove PICC since not needed.  Recent dx of hyperthyroidism. Goals min A by d/c.    See Team Conference Notes for weekly updates to the plan of care

## 2021-03-01 NOTE — Plan of Care (Signed)
°  Problem: RH Swallowing Goal: LTG Pt will demonstrate functional change in swallow as evidenced by bedside/clinical objective assessment (SLP) Description: LTG: Patient will demonstrate functional change in swallow as evidenced by bedside/clinical objective assessment (SLP) Flowsheets (Taken 03/01/2021 1202) LTG: Patient will demonstrate functional change in swallow as evidenced by bedside/clinical objective assessment: Oropharyngeal swallow Note: Regular/thin   Problem: RH Problem Solving Goal: LTG Patient will demonstrate problem solving for (SLP) Description: LTG:  Patient will demonstrate problem solving for basic/complex daily situations with cues  (SLP) Flowsheets (Taken 03/01/2021 1202) LTG: Patient will demonstrate problem solving for (SLP): Complex daily situations LTG Patient will demonstrate problem solving for: Supervision   Problem: RH Memory Goal: LTG Patient will use memory compensatory aids to (SLP) Description: LTG:  Patient will use memory compensatory aids to recall biographical/new, daily complex information with cues (SLP) Flowsheets (Taken 03/01/2021 1202) LTG: Patient will use memory compensatory aids to (SLP): Supervision   Problem: RH Awareness Goal: LTG: Patient will demonstrate awareness during functional activites type of (SLP) Description: LTG: Patient will demonstrate awareness during functional activites type of (SLP) Flowsheets (Taken 03/01/2021 1202) Patient will demonstrate during cognitive/linguistic activities awareness type of:  Anticipatory  Emergent LTG: Patient will demonstrate awareness during cognitive/linguistic activities with assistance of (SLP): Supervision   Problem: RH Attention Goal: LTG Patient will demonstrate this level of attention during functional activites (SLP) Description: LTG:  Patient will will demonstrate this level of attention during functional activites (SLP) Flowsheets (Taken 03/01/2021 1202) Patient will demonstrate during  cognitive/linguistic activities the attention type of:  Alternating  Selective Patient will demonstrate this level of attention during cognitive/linguistic activities in: Home LTG: Patient will demonstrate this level of attention during cognitive/linguistic activities with assistance of (SLP): Supervision

## 2021-03-01 NOTE — Progress Notes (Signed)
Occupational Therapy Session Note  Patient Details  Name: Ethelyn Cerniglia MRN: [id number] Date of Birth: 1970-10-08  Today's Date: 03/02/2021 OT Individual Time: 1348-1430 OT Individual Time Calculation (min): 42 min    Short Term Goals: Week 1:  OT Short Term Goal 1 (Week 1): Pt will transfer to BSC/toilet with 1 assist and LRAD OT Short Term Goal 2 (Week 1): Pt will don shirt with MOD A with hemitechnique OT Short Term Goal 3 (Week 1): Pt will don pants over hips in standing with MOD A  Skilled Therapeutic Interventions/Progress Updates:    Pt greeted in bed, c/o n/v and anticipating x-ray. Per charge RN, pt ok to participate in therapy but bedlevel therapy preferable to accommodate transport team. OT played meaningful music for pt, tactile cues for increasing attention to the Lt UE/LE while she was guided through simple dance-based bilaterally involved exercises. Active assist for the Lt UE to increase amplitude of movement per pts tolerance. Pt required frequent cues for sustained attention to task, tended to mouth the words of the songs. 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 Pt remained in bed at close of session, all needs within reach and bed alarm set, pt requesting 4 bedrails to be up on her bed.   Therapy Documentation Precautions:  Precautions Precautions: Fall Precaution Comments: L hemi (UE>LE), PICC R UE Restrictions Weight Bearing Restrictions: No General: General PT Missed Treatment Reason: Nursing care;Xray Pain: pt denied pain during tx   ADL: ADL Eating: Not assessed Grooming: Minimal assistance Upper Body Bathing: Moderate assistance Lower Body Bathing: Maximal assistance Upper Body Dressing: Maximal assistance Lower Body Dressing: Dependent Toileting: Dependent              :     Therapy/Group: Individual Therapy  Basilia Stuckert A Mykaila Blunck 03/02/2021, 3:34 PM

## 2021-03-01 NOTE — Progress Notes (Signed)
PROGRESS NOTE   Subjective/Complaints:  LBM this AM- medium- prior was small BM on 1/25 and 1/24 evening.   Pt reports she wants PICC line removed-  Was able to tell me LBM was this AM.    ROS: Limited by cognition   Objective:   No results found. Recent Labs    02/27/21 0612  WBC 6.8  HGB 11.3*  HCT 34.4*  PLT 502*   Recent Labs    02/27/21 0612  NA 136  K 4.3  CL 103  CO2 25  GLUCOSE 196*  BUN 12  CREATININE 0.76  CALCIUM 8.6*    Intake/Output Summary (Last 24 hours) at 03/01/2021 0900 Last data filed at 03/01/2021 0759 Gross per 24 hour  Intake 350 ml  Output --  Net 350 ml        Physical Exam: Vital Signs Blood pressure (!) 140/94, pulse 86, temperature 98.7 F (37.1 C), temperature source Oral, resp. rate 14, height 5\' 2"  (1.575 m), weight 94.8 kg, SpO2 98 %.      General: awake, alert, laying supine in bed; covers to chin; NAD HENT: conjugate gaze; oropharynx moist CV: regular rate; no JVD Pulmonary: CTA B/L; no W/R/R- good air movement GI: soft, NT, ND, (+)BS- normoactive- finished breakfast Psychiatric: appropriate Neurological: alert, but delayed responses more evident today- mainly nodding/shaking head yes and no this AM Musculoskeletal: has hyperextension of multiple fingers on L hand at rest    Cervical back: Normal range of motion and neck supple.  Skin:    General: Skin is warm and dry.  Neurological:     Mental Status: She is alert.     Comments: Dysconjugate gaze. Slow to initiate and process. Language is deliberate. Able to communicate thoughts.    Psychiatric:     Comments: Cooperative but flat  RUE and RLE grossly 4- to 4/5. LUE 3/5 at shoulder,elbow but limited d/t flexor contracture and hypertonicity at left wrist/fingers   RLE 4/5. LLE 3 to 3+/5.  Hyperreflexic on left.  Also with mild inattention to left.   Assessment/Plan: 1. Functional deficits which require 3+  hours per day of interdisciplinary therapy in a comprehensive inpatient rehab setting. Physiatrist is providing close team supervision and 24 hour management of active medical problems listed below. Physiatrist and rehab team continue to assess barriers to discharge/monitor patient progress toward functional and medical goals  Care Tool:  Bathing    Body parts bathed by patient: Left arm, Chest, Abdomen, Right upper leg, Left upper leg, Front perineal area, Face   Body parts bathed by helper: Buttocks, Right arm, Right lower leg, Left lower leg     Bathing assist Assist Level: Maximal Assistance - Patient 24 - 49%     Upper Body Dressing/Undressing Upper body dressing   What is the patient wearing?: Pull over shirt    Upper body assist Assist Level: Total Assistance - Patient < 25%    Lower Body Dressing/Undressing Lower body dressing      What is the patient wearing?: Incontinence brief, Pants     Lower body assist Assist for lower body dressing: Total Assistance - Patient < 25%     Toileting  Toileting    Toileting assist Assist for toileting: Dependent - Patient 0%     Transfers Chair/bed transfer  Transfers assist     Chair/bed transfer assist level: Moderate Assistance - Patient 50 - 74%     Locomotion Ambulation   Ambulation assist   Ambulation activity did not occur: Safety/medical concerns          Walk 10 feet activity   Assist  Walk 10 feet activity did not occur: Safety/medical concerns        Walk 50 feet activity   Assist Walk 50 feet with 2 turns activity did not occur: Safety/medical concerns         Walk 150 feet activity   Assist Walk 150 feet activity did not occur: Safety/medical concerns         Walk 10 feet on uneven surface  activity   Assist Walk 10 feet on uneven surfaces activity did not occur: Safety/medical concerns         Wheelchair     Assist Is the patient using a wheelchair?: Yes Type  of Wheelchair: Manual    Wheelchair assist level: Dependent - Patient 0%      Wheelchair 50 feet with 2 turns activity    Assist    Wheelchair 50 feet with 2 turns activity did not occur: Safety/medical concerns       Wheelchair 150 feet activity     Assist  Wheelchair 150 feet activity did not occur: Safety/medical concerns       Blood pressure (!) 140/94, pulse 86, temperature 98.7 F (37.1 C), temperature source Oral, resp. rate 14, height 5\' 2"  (1.575 m), weight 94.8 kg, SpO2 98 %.  Medical Problem List and Plan: 1. 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  Antithrombotics: -DVT/anticoagulation:  Pharmaceutical: Lovenox             -antiplatelet therapy: Plavix.  3. Pain Management/spasticity: Tylenol prn.              -ROM with therapy             -consider botox, defer to primary team 4. Mood: LCSW to follow for evaluation and support.              -antipsychotic agents: N/A 5. Neuropsych: This patient may be capable of making decisions on her own behalf.             -will need to investigate further with family input.  6. Skin/Wound Care:  Routine pressure relief measures.  7. Fluids/Electrolytes/Nutrition: Monitor I/O. Check CMET in am. 8. T2DM: Hgb A1c-9.0 poorly controlled. Has been on insulin for the past year.              --came in with DKA -->on Glucerna TID BETWEEN meals. Will change to lower carb Ensure max with juven for low protein stores.              --Continue Insulin gargline with 25 units at night and 3 units TID with meals  1/24- will monitor for trend- is elevated- and change as of tomorrow  1/26- CBGs 98 to 249- pt asking for juices,  sweets all the time per team - they are having to limit sugar intake. Will  increase meal coverage to 4 units TID 9.  Hyperthyroid: Recent diagnosis and now on Tapazole             --continue to monitor HR TID.  10. Gastroparesis?: Change reglan to po route.             --monitor for TDK  1/26- will remove PICC since taking Reglan PO- LBM this AM- is going.  11. H/o anxiety/depression: Continue Lexapro with ativan at bedtime.             --melatonin for sleep wake disruption.  12. Hyponatremia: Monitor Sodium level with serial checks as trending down.              --1/25- Up to 136 today - will monitor  13. HTN: SBP goal 130-150 due to severe intracranial atherosclerosis.  --Monitor BP TID. Continue Zestril.   1/24- BP 147/98 this AM- in the range recommended, so will not make changes 14. Constipation  1/24- will give sorbitol after therapy and monitor   I spent a total of 35   minutes on total care today- >50% coordination of care- due to d/w PA and pt about PICC line- reviewing chart-  Also going over with nursing about CBGs/intake.     LOS: 3 days A FACE TO FACE EVALUATION WAS PERFORMED  Lori [name] 03/01/2021, 9:00 AM

## 2021-03-01 NOTE — Progress Notes (Signed)
Physical Therapy Session Note  Patient Details  Name: Lori [name] MRN: [id number] Date of Birth: 15-May-1970  Today's Date: 03/01/2021 PT Individual Time: 0915-1030, 7412-8786 PT Individual Time Calculation (min): 75 min, 34 min   Short Term Goals: Week 1:  PT Short Term Goal 1 (Week 1): Pt will transfer with mod A or better with LRAD PT Short Term Goal 2 (Week 1): Pt will initiate gait training PT Short Term Goal 3 (Week 1): Pt will initiate w/c propulsion  Skilled Therapeutic Interventions/Progress Updates:    Session 1: pt received in bed and agreeable to therapy. No complaint of pain. Pt with low arousal level this morning, requiring incr levels of assist and encouragement for independence. Mod A sup>sit with max facilitation for technique. Pt able to sit EOB without LOB while donning shirt and pants. Multiple Sit to stand to rw with mod-max A to pull pants over hips. Stand pivot transfer to w/c with RW and mod A. Pt completed oral hygiene at sink with min A.   Pt transported to therapy gym for time management and energy conservation. Stand pivot transfer to mat table with mod A. Sit>supine with min A for LE management. Pt directed in NDT facilitation in sidelying on L elbow, shifting weight to facilitate improved bed mobility. Once in sitting, directed in facilitated scooting forward and back for improved bed and w/c mobility. Pt then practiced shifting from sitting EOM to leaning on elbow before progressing to sit<>sidelying, initially min A for LLE but then facilitation at hips only.   Sit to stand with RW, mod A and side stepping back to chair in same manner, Pt performed max A transfer as she began to sit before reaching chair and required assist to safely get to chair. Pt transported back to room and was left with all needs in reach and alarm active.   Session 2: pt received in bed and agreeable to therapy. No complaint of pain. Session focused on bed level exercises d/t fatigue  at end of day. Pt performed the following exercises to promote LE strength and endurance: heel slides x 3 bouts of ~12. SLR with HOB elevated 3 x 20. Reclined hip abduction 3 x 20. SAQ 2 x 20 with emphasis on terminal knee extension. Therapist provided verbal encouragement and VC for technique throughout. Pt requested to get into w/c at end of session to sit up for dinner. Mod A Stand pivot transfer with RW. Pt remained in chair and was left with all needs in reach and alarm active.   Therapy Documentation Precautions:  Precautions Precautions: Fall Precaution Comments: L hemi (UE>LE), PICC R UE Restrictions Weight Bearing Restrictions: No General:   Vital Signs:  Pain: Pain Assessment Pain Scale: 0-10 Pain Score: 0-No pain Mobility:   Locomotion :    Trunk/Postural Assessment :    Balance:   Exercises:   Other Treatments:      Therapy/Group: Individual Therapy  Mickel Fuchs 03/01/2021, 9:43 AM

## 2021-03-02 ENCOUNTER — Inpatient Hospital Stay (HOSPITAL_COMMUNITY): Payer: Medicare Other

## 2021-03-02 LAB — GLUCOSE, CAPILLARY
Glucose-Capillary: 150 mg/dL — ABNORMAL HIGH (ref 70–99)
Glucose-Capillary: 145 mg/dL — ABNORMAL HIGH (ref 70–99)
Glucose-Capillary: 105 mg/dL — ABNORMAL HIGH (ref 70–99)
Glucose-Capillary: 170 mg/dL — ABNORMAL HIGH (ref 70–99)

## 2021-03-02 IMAGING — CR DG CHEST 2V
2 series · 2 of 2 positions shown · non-contrast
Comparison: [DATE]

CLINICAL DATA: Rehab, aspiration

EXAM:
CHEST - 2 VIEW

[chest lat]
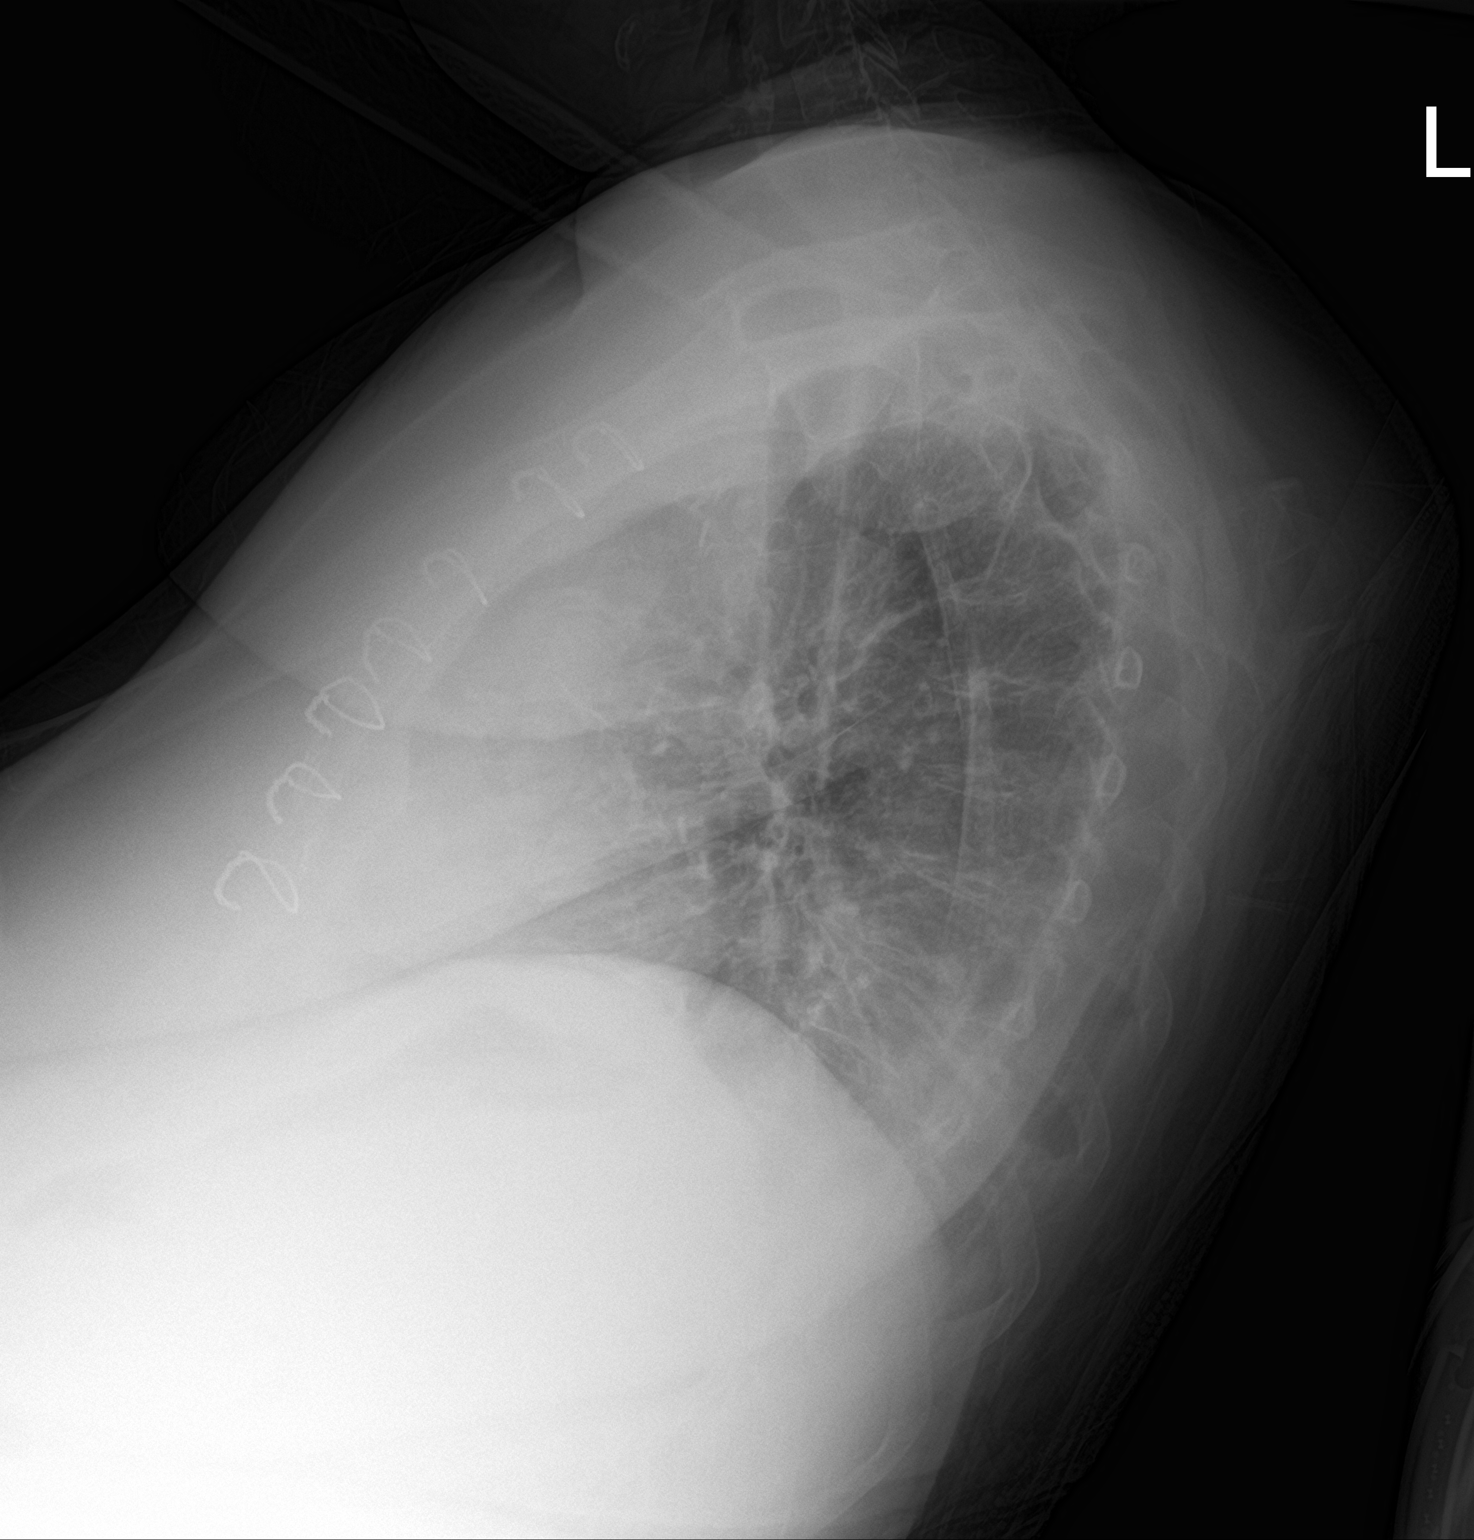

[chest ap]
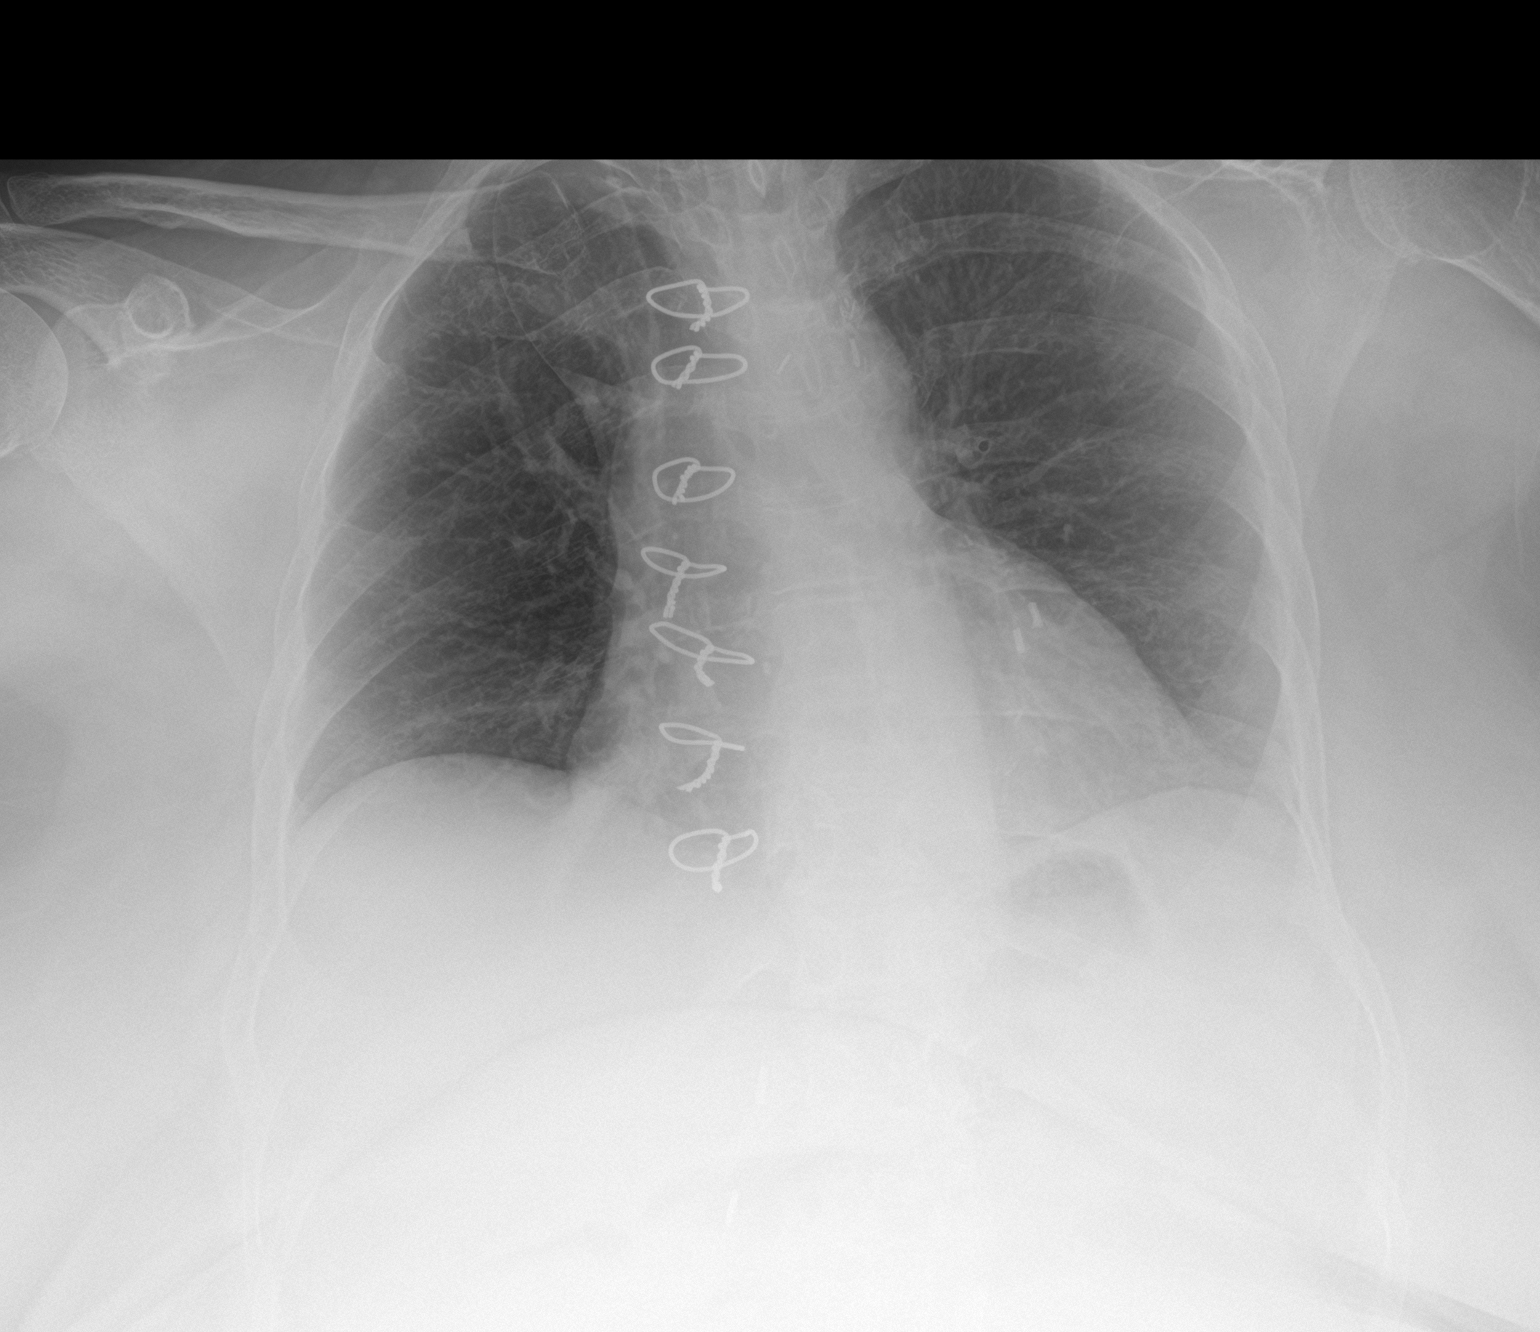

[2 of 2 positions shown; findings below may reference images not displayed]

FINDINGS: Upper normal heart size post CABG.

Mediastinal contours and pulmonary vascularity normal.

Minimal subsegmental atelectasis at RIGHT base.

Lungs otherwise clear.

No infiltrate, pleural effusion, or pneumothorax.

No acute osseous findings.
IMPRESSION: Post CABG.

Minimal RIGHT basilar atelectasis.

## 2021-03-02 MED ORDER — SORBITOL 70 % SOLN
45.0000 mL | Freq: Once | Status: AC
  Administered 2021-03-02: 45 mL via ORAL
  Filled 2021-03-02: qty 60

## 2021-03-02 MED ORDER — SENNA 8.6 MG PO TABS
2.0000 | ORAL_TABLET | Freq: Every day | ORAL | Status: DC
  Administered 2021-03-02 – 2021-03-16 (×15): 17.2 mg via ORAL
  Filled 2021-03-02 (×15): qty 2

## 2021-03-02 MED ORDER — DICLOFENAC SODIUM 1 % EX GEL
4.0000 g | Freq: Four times a day (QID) | CUTANEOUS | Status: DC
  Administered 2021-03-02 – 2021-03-16 (×34): 4 g via TOPICAL
  Filled 2021-03-02: qty 100

## 2021-03-02 NOTE — Progress Notes (Signed)
PROGRESS NOTE   Subjective/Complaints:  Pt reports still hasn't had a BM- ordered Senna 2 tabs daily as well as Sorbitol 45cc after therapy.   Also c/o L knee pain- is intermittent, but really bothering her right now.  PICC line out.  Doesn't remember when had LBM, but knows she's constipated.  Also asked to speak to SW.   ROS: Limited by cognition   Objective:   No results found. No results for input(s): WBC, HGB, HCT, PLT in the last 72 hours.  No results for input(s): NA, K, CL, CO2, GLUCOSE, BUN, CREATININE, CALCIUM in the last 72 hours.   Intake/Output Summary (Last 24 hours) at 03/02/2021 0839 Last data filed at 03/01/2021 1901 Gross per 24 hour  Intake 240 ml  Output --  Net 240 ml        Physical Exam: Vital Signs Blood pressure 120/86, pulse 89, temperature 98 F (36.7 C), temperature source Oral, resp. rate 14, height 5\' 2"  (1.575 m), weight 94.8 kg, SpO2 100 %.      General: awake, alert, appropriate, sitting up in bed; brighter; NAD HENT: conjugate gaze; oropharynx moist CV: regular rate; no JVD Pulmonary: CTA B/L; no W/R/R- good air movement GI: soft, NT; slightly distended; hypoactive BS Psychiatric: appropriate Neurological: alert, more interactive, asking more questions Musculoskeletal: has hyperextension of multiple fingers on L hand at rest    Cervical back: Normal range of motion and neck supple.  Skin:    General: Skin is warm and dry.  Neurological:     Mental Status: She is alert.     Comments: Dysconjugate gaze. Slow to initiate and process. Language is deliberate. Able to communicate thoughts.    Psychiatric:     Comments: Cooperative but flat  RUE and RLE grossly 4- to 4/5. LUE 3/5 at shoulder,elbow but limited d/t flexor contracture and hypertonicity at left wrist/fingers   RLE 4/5. LLE 3 to 3+/5.  Hyperreflexic on left.  Also with mild inattention to left.    Assessment/Plan: 1. Functional deficits which require 3+ hours per day of interdisciplinary therapy in a comprehensive inpatient rehab setting. Physiatrist is providing close team supervision and 24 hour management of active medical problems listed below. Physiatrist and rehab team continue to assess barriers to discharge/monitor patient progress toward functional and medical goals  Care Tool:  Bathing    Body parts bathed by patient: Left arm, Chest, Abdomen, Right upper leg, Left upper leg, Front perineal area, Face   Body parts bathed by helper: Buttocks, Right arm, Right lower leg, Left lower leg     Bathing assist Assist Level: Maximal Assistance - Patient 24 - 49%     Upper Body Dressing/Undressing Upper body dressing   What is the patient wearing?: Pull over shirt    Upper body assist Assist Level: Total Assistance - Patient < 25%    Lower Body Dressing/Undressing Lower body dressing      What is the patient wearing?: Incontinence brief, Pants     Lower body assist Assist for lower body dressing: Total Assistance - Patient < 25%     Toileting Toileting    Toileting assist Assist for toileting: Dependent - Patient  0%     Transfers Chair/bed transfer  Transfers assist     Chair/bed transfer assist level: Moderate Assistance - Patient 50 - 74%     Locomotion Ambulation   Ambulation assist   Ambulation activity did not occur: Safety/medical concerns          Walk 10 feet activity   Assist  Walk 10 feet activity did not occur: Safety/medical concerns        Walk 50 feet activity   Assist Walk 50 feet with 2 turns activity did not occur: Safety/medical concerns         Walk 150 feet activity   Assist Walk 150 feet activity did not occur: Safety/medical concerns         Walk 10 feet on uneven surface  activity   Assist Walk 10 feet on uneven surfaces activity did not occur: Safety/medical concerns          Wheelchair     Assist Is the patient using a wheelchair?: Yes Type of Wheelchair: Manual    Wheelchair assist level: Dependent - Patient 0%      Wheelchair 50 feet with 2 turns activity    Assist    Wheelchair 50 feet with 2 turns activity did not occur: Safety/medical concerns       Wheelchair 150 feet activity     Assist  Wheelchair 150 feet activity did not occur: Safety/medical concerns       Blood pressure 120/86, pulse 89, temperature 98 F (36.7 C), temperature source Oral, resp. rate 14, height 5\' 2"  (1.575 m), weight 94.8 kg, SpO2 100 %.  Medical Problem List and Plan: 1. 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  Antithrombotics: -DVT/anticoagulation:  Pharmaceutical: Lovenox             -antiplatelet therapy: Plavix.  3. Pain Management/spasticity: Tylenol prn.              -ROM with therapy             -consider botox, defer to primary team  1/27- will add Voltaren gel for L knee QID 4. Mood: LCSW to follow for evaluation and support.              -antipsychotic agents: N/A 5. Neuropsych: This patient may be capable of making decisions on her own behalf.             -will need to investigate further with family input.  6. Skin/Wound Care:  Routine pressure relief measures.  7. Fluids/Electrolytes/Nutrition: Monitor I/O. Check CMET in am. 8. T2DM: Hgb A1c-9.0 poorly controlled. Has been on insulin for the past year.              --came in with DKA -->on Glucerna TID BETWEEN meals. Will change to lower carb Ensure max with juven for low protein stores.              --Continue Insulin gargline with 25 units at night and 3 units TID with meals  1/24- will monitor for trend- is elevated- and change as of  tomorrow  1/26- CBGs 98 to 249- pt asking for juices,  sweets all the time per team - they are having to limit sugar intake. Will increase meal coverage to 4 units TID  1/27- CBGs 121-226- slightly better- con't regimen and increase slowly.  9.  Hyperthyroid: Recent diagnosis and now on Tapazole             --continue to monitor HR TID.  10. Gastroparesis?: Change reglan to po route.             --monitor for TDK  1/26- will remove PICC since taking Reglan PO- LBM this AM- is going.  11. H/o anxiety/depression: Continue Lexapro with ativan at bedtime.             --melatonin for sleep wake disruption.  12. Hyponatremia: Monitor Sodium level with serial checks as trending down.              --1/25- Up to 136 today - will monitor  13. HTN: SBP goal 130-150 due to severe intracranial atherosclerosis.  --Monitor BP TID. Continue Zestril.   1/24- BP 147/98 this AM- in the range recommended, so will not make changes 14. Constipation  1/24- will give sorbitol after therapy and monitor  1/27- LBM 2-3 days ago per pt- will give Sorbitol after therapy and add Senna 2 tabs daily. Alsready on Miralax.    I spent a total of  35  minutes on total care today- >50% coordination of care- due to d/w nursing and pt about Bowels, pain and SW  LOS: 4 days A FACE TO FACE EVALUATION WAS PERFORMED  Lori [name] 03/02/2021, 8:39 AM

## 2021-03-02 NOTE — Progress Notes (Signed)
Patient ID: Lori [name], female   DOB: [birth year], 51 y.o.   MRN: [id number]  Met with pt who reports she knows this worker spoke with [employer] via telephone and now the plan is for pt and husband to get a house and their tow daughter's will take care of her, although they are seniors in [place]. She asked for an aide and discussed [facility] would provide 3-4 hours then what would she do for care, she feels her husband can provide the care although he runs a business. Discussed how unrealistic this all is and the time frame to take place is before 2/10. Her discharge date. Pt is having difficulty swallowing today feels lettuce is caught in her throat-bedside RN aware and wi dealing with this. Will work on safe discharge plan. Made aware her grandfather wandered from home and a silver alert was called on him yesterday. Have left another message for husband.

## 2021-03-02 NOTE — Progress Notes (Signed)
This nurse entered room to administer requested medication for nausea and Prescribed laxative for no BM for several days. Patient willing to accept medication after administration of Sorbitol and Reglan. patient began coughing violently and after interventions to clear airway patient HOB increased to 60 degrees and Suction device in use Vitals taken with elevated Blood pressure  and pulse with SaO2 97-100% on room air. Patient states " I need to cough something feels stuck in my throat" turn cough and deep breathing encouraged and assistance provided to client with little sputum expressed. Camera operator and this nurse worked to stabilize patient and PA notified. This nurse received orders for 2V Chest x-ray to evaluate for aspiration and orders to keep head of bed upright. Patient left in lowest position call light in reach

## 2021-03-02 NOTE — Progress Notes (Signed)
Occupational Therapy Session Note  Patient Details  Name: Lori [name] MRN: [id number] Date of Birth: 1970/11/14  Today's Date: 03/03/2021 OT Individual Time: 1134-1200 and 4196-2229 OT Individual Time Calculation (min): 26 min and 47 min   Short Term Goals: Week 1:  OT Short Term Goal 1 (Week 1): Pt will transfer to BSC/toilet with 1 assist and LRAD OT Short Term Goal 2 (Week 1): Pt will don shirt with MOD A with hemitechnique OT Short Term Goal 3 (Week 1): Pt will don pants over hips in standing with MOD A  Skilled Therapeutic Interventions/Progress Updates:    Pt greeted in the w/c, requesting assistance with laundry. OT collected pts soiled clothing and placed them into a bag. Noted pair of pants that was covered in BM and placed in drawer by nursing. Notified RN. Pt asked OT to throw those pants away. To work on [employer], pt self propelled the w/c using her feet approx 50 ft in the hallway, max encouragement required and then pt adamant that she needed assistance for reaching the laundry room. Sit<stand in front of washing machine completed with Min A and increased time. She emptied the laundry bag contents into machine and then added detergent pad + adjusted settings with vcs. Noted decreased standing endurance with pt exhibiting increased knee flexion bilaterally and placing elbows onto the machine for balance support. She was then returned to the room via w/c, pt keeping LEs elevated. She remained sitting up, all needs within reach and safety belt fastened.   2nd Session 1:1 tx (47 min) Pt greeted in bed, requesting to retrieve her laundry from the laundry room. Min A for supine<sit with encouragement to transition EOB at max level of independence. Pt tried to don sneakers but unable to get heel in with the Rt foot. OT assisted pt with figure 4 position bilaterally for stretching while assisting with footwear. Min A for sit<stand and when pt pivoted towards the w/c she had more LE  buckling. Assisted pt to sit back onto the bed and then attempt again. Min A for stand pivot<w/c using RW with pt exhibiting poor eccentric control when lowering. Mod A for sit<stand to practice reaching back for the chair vs plopping. Pt then self propelled her w/c backwards to the laundry room to work on [place] NMR/strengthening needed for functional transfers/standing. Worked on sit<stands and standing endurance while in the laundry room, pt loading her washed clothes into the dryer and adjusting settings. Min balance assist, though pt relied heavily on the washer/dryer for balance support. Needed 3 seated rest breaks to complete task. Pt continues to exhibit self limiting behaviors, asking for help before attempting a task herself, asking for additional assistance during sit<stands when she can perform at CGA-Min A level. She was then escorted back to the room via w/c. Mod A to boost into standing with RW and Min A for stand pivot<bed using RW. Mod A to transition back to bed and she was then assisted into sidelying for comfort. All needs within reach and bed alarm set at time of OT departure.   Therapy Documentation Precautions:  Precautions Precautions: Fall Precaution Comments: L hemi (UE>LE), PICC R UE Restrictions Weight Bearing Restrictions: No Vital Signs: Therapy Vitals Temp: 98.3 F (36.8 C) Temp Source: Oral Pulse Rate: (!) 103 Resp: 18 BP: 110/88 Patient Position (if appropriate): Lying Oxygen Therapy SpO2: 100 % O2 Device: Room Air Pain: no c/o pain during session   ADL: ADL Eating: Not assessed Grooming:  Minimal assistance Upper Body Bathing: Moderate assistance Lower Body Bathing: Maximal assistance Upper Body Dressing: Maximal assistance Lower Body Dressing: Dependent Toileting: Dependent   Therapy/Group: Individual Therapy  Scott Vanderveer A [place] 03/03/2021, 4:20 PM

## 2021-03-02 NOTE — Progress Notes (Signed)
Occupational Therapy Session Note  Patient Details  Name: Lori [name] MRN: [id number] Date of Birth: [birth year]  Today's Date: 03/02/2021 OT Individual Time: 1000-1057 OT Individual Time Calculation (min): 57 min    Short Term Goals: Week 1:  OT Short Term Goal 1 (Week 1): Pt will transfer to BSC/toilet with 1 assist and LRAD OT Short Term Goal 2 (Week 1): Pt will don shirt with MOD A with hemitechnique OT Short Term Goal 3 (Week 1): Pt will don pants over hips in standing with MOD A  Skilled Therapeutic Interventions/Progress Updates:  Pt greeted  seated in w/c  agreeable to OT intervention. Session focus on functional mobility, dynamic standing balance, increasing overall activity tolerance, LUE NMR and decreasing overall caregiver burden.  Pt transported to gym from w/c with total A, pt completed stand pivot transfer from w/c> EOM with Rw and MIN A. Pt completed dynamic balance task where stood with Rw to use RUE to play jenga with LUE WB on RW for NMR. Pt needed MOD cues to follow rules of game as pt at first only removing blocks from top of tower.Pt able to stand for 1 min and 34 secs before needing to sit down. Pt reports mild dizziness but agreeable to second trial, pt completed second trial standing trial for 1 min  before needing to sit again reporting dizziness. HR 105 SpO2 98%  BP from sitting 118/83 ( 94)  Attempted orthostatic VS with pt unable to stand long enough to get standing BP BP from sitting: 127/98( 108) Hr 99  Pt then reports nausea, stand pivot back to w/c with Rw and MIN A. Pt transported back to room with total A where pt completed additional stand pivot back to bed with rw and MIN A. RN enter provided nausea meds, updated Rn on dizziness in gym. Pt needed MOD A for sit>supine. Pt left  supine in bed with bed alarm activated and all needs within reach.                Therapy Documentation Precautions:  Precautions Precautions: Fall Precaution Comments: L  hemi (UE>LE), PICC R UE Restrictions Weight Bearing Restrictions: No  Pain: no pain reported during session     Therapy/Group: Individual Therapy  Corinne Ports [facility] 03/02/2021, 11:05 AM

## 2021-03-02 NOTE — Progress Notes (Signed)
Physical Therapy Session Note  Patient Details  Name: Lori [name] MRN: [id number] Date of Birth: 11-Mar-1970  Today's Date: 03/02/2021 PT Individual Time: 0900-1000,  PT Individual Time Calculation (min): 60 min,   Short Term Goals: Week 1:  PT Short Term Goal 1 (Week 1): Pt will transfer with mod A or better with LRAD PT Short Term Goal 2 (Week 1): Pt will initiate gait training PT Short Term Goal 3 (Week 1): Pt will initiate w/c propulsion  Skilled Therapeutic Interventions/Progress Updates:    Session 1: pt received in bed and agreeable to therapy. No complaint of pain. Sup>sit with min physical assist but max VC and instruction for technique. Stand pivot transfer to w/c with RW, min A but max encouragement. Session focused on standing tolerance while performing ADLs. Brushed teeth, washed, donned shirt, bra and pants with mod-max A. Standing as tolerated, up to 2 min. Pt able to stand upright ~3 sec before beginning to sink back towards chair and needs constant cueing to remain upright. Rest of session focused on w/c propulsion with BLE and RUE. Pt propelled fwd and back 2 x ~50 ft for anterior and posterior chain strengthening. Occ cueing to continue using LLE with fatigue. Pt remained in w/c at end of session and was left with all needs in reach and alarm active.   Session 2: Missed several minutes on therapist arrival d/t nursing care. As therapist entered the room, transport arrived to take pt for imaging. Pt missed 30 min of scheduled therapy. Will attempt to make up as able.   Therapy Documentation Precautions:  Precautions Precautions: Fall Precaution Comments: L hemi (UE>LE), PICC R UE Restrictions Weight Bearing Restrictions: No General: PT Amount of Missed Time (min): 30 Minutes PT Missed Treatment Reason: Nursing care;Xray     Therapy/Group: Individual Therapy  Mickel Fuchs 03/02/2021, 9:26 AM

## 2021-03-03 LAB — GLUCOSE, CAPILLARY
Glucose-Capillary: 158 mg/dL — ABNORMAL HIGH (ref 70–99)
Glucose-Capillary: 183 mg/dL — ABNORMAL HIGH (ref 70–99)
Glucose-Capillary: 228 mg/dL — ABNORMAL HIGH (ref 70–99)
Glucose-Capillary: 206 mg/dL — ABNORMAL HIGH (ref 70–99)

## 2021-03-03 MED ORDER — INSULIN GLARGINE-YFGN 100 UNIT/ML ~~LOC~~ SOLN
27.0000 [IU] | Freq: Every day | SUBCUTANEOUS | Status: DC
  Administered 2021-03-03 – 2021-03-05 (×3): 27 [IU] via SUBCUTANEOUS
  Filled 2021-03-03 (×4): qty 0.27

## 2021-03-03 NOTE — Progress Notes (Signed)
PROGRESS NOTE   Subjective/Complaints:  No new complaints. Doesn't offer much. Denies pain. Says she slept  ROS: Limited due to cognitive/behavioral    Objective:   DG Chest 2 View  Result Date: 03/02/2021 CLINICAL DATA:  Rehab, aspiration EXAM: CHEST - 2 VIEW COMPARISON:  02/07/2021 FINDINGS: Upper normal heart size post CABG. Mediastinal contours and pulmonary vascularity normal. Minimal subsegmental atelectasis at RIGHT base. Lungs otherwise clear. No infiltrate, pleural effusion, or pneumothorax. No acute osseous findings. IMPRESSION: Post CABG. Minimal RIGHT basilar atelectasis. Electronically Signed   By: Lavonia Dana M.D.   On: 03/02/2021 16:10   No results for input(s): WBC, HGB, HCT, PLT in the last 72 hours.  No results for input(s): NA, K, CL, CO2, GLUCOSE, BUN, CREATININE, CALCIUM in the last 72 hours.   Intake/Output Summary (Last 24 hours) at 03/03/2021 1137 Last data filed at 03/02/2021 2100 Gross per 24 hour  Intake 480 ml  Output --  Net 480 ml        Physical Exam: Vital Signs Blood pressure 127/82, pulse 86, temperature (!) 97.5 F (36.4 C), temperature source Oral, resp. rate 18, height 5\' 2"  (1.575 m), weight 94.8 kg, SpO2 100 %.      Constitutional: No distress . Vital signs reviewed. HEENT: NCAT, EOMI, oral membranes moist Neck: supple Cardiovascular: RRR without murmur. No JVD    Respiratory/Chest: CTA Bilaterally without wheezes or rales. Normal effort    GI/Abdomen: BS +, non-tender, non-distended Ext: no clubbing, cyanosis, or edema Psych: flat  but cooperative  Musculoskeletal: has hyperextension of multiple fingers on L hand at rest    Cervical back: Normal range of motion and neck supple.  Skin:    General: Skin is warm and dry.  Neurological:     Mental Status: She is alert but slow to process.  Follows simple ommands.     Comments: Dysconjugate gaze.     RUE and RLE grossly 4-  to 4/5. LUE 3/5 at shoulder,elbow but limited d/t flexor contracture and hypertonicity at left wrist/fingers   RLE 4/5. LLE 3 to 3+/5.  Hyperreflexic on left.  Also with mild inattention to left.   Assessment/Plan: 1. Functional deficits which require 3+ hours per day of interdisciplinary therapy in a comprehensive inpatient rehab setting. Physiatrist is providing close team supervision and 24 hour management of active medical problems listed below. Physiatrist and rehab team continue to assess barriers to discharge/monitor patient progress toward functional and medical goals  Care Tool:  Bathing    Body parts bathed by patient: Left arm, Chest, Abdomen, Right upper leg, Left upper leg, Front perineal area, Face   Body parts bathed by helper: Buttocks, Right arm, Right lower leg, Left lower leg     Bathing assist Assist Level: Maximal Assistance - Patient 24 - 49%     Upper Body Dressing/Undressing Upper body dressing   What is the patient wearing?: Pull over shirt    Upper body assist Assist Level: Total Assistance - Patient < 25%    Lower Body Dressing/Undressing Lower body dressing      What is the patient wearing?: Incontinence brief, Pants     Lower body assist Assist  for lower body dressing: Total Assistance - Patient < 25%     Toileting Toileting    Toileting assist Assist for toileting: Dependent - Patient 0%     Transfers Chair/bed transfer  Transfers assist     Chair/bed transfer assist level: Minimal Assistance - Patient > 75%     Locomotion Ambulation   Ambulation assist   Ambulation activity did not occur: Safety/medical concerns          Walk 10 feet activity   Assist  Walk 10 feet activity did not occur: Safety/medical concerns        Walk 50 feet activity   Assist Walk 50 feet with 2 turns activity did not occur: Safety/medical concerns         Walk 150 feet activity   Assist Walk 150 feet activity did not occur:  Safety/medical concerns         Walk 10 feet on uneven surface  activity   Assist Walk 10 feet on uneven surfaces activity did not occur: Safety/medical concerns         Wheelchair     Assist Is the patient using a wheelchair?: Yes Type of Wheelchair: Manual    Wheelchair assist level: Dependent - Patient 0%      Wheelchair 50 feet with 2 turns activity    Assist        Assist Level: Dependent - Patient 0%   Wheelchair 150 feet activity     Assist      Assist Level: Dependent - Patient 0%   Blood pressure 127/82, pulse 86, temperature (!) 97.5 F (36.4 C), temperature source Oral, resp. rate 18, height 5\' 2"  (1.575 m), weight 94.8 kg, SpO2 100 %.  Medical Problem List and Plan: 1. 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  Antithrombotics: -DVT/anticoagulation:  Pharmaceutical: Lovenox             -antiplatelet therapy: Plavix.  3. Pain Management/spasticity: Tylenol prn.              -ROM with therapy             -consider botox, defer to primary team  1/28=continue Voltaren gel for L knee QID 4. Mood: LCSW to follow for evaluation and support.              -antipsychotic agents: N/A 5. Neuropsych: This patient may be capable of making decisions on her own behalf.             -will need to investigate further with family input.  6. Skin/Wound Care:  Routine pressure relief measures.  7. Fluids/Electrolytes/Nutrition: Monitor I/O. Check CMET in am. 8. T2DM: Hgb A1c-9.0 poorly controlled. Has been on insulin for the past year.              --came in with DKA -->on Glucerna TID BETWEEN meals. Will change to lower carb Ensure max with juven for low protein stores.              --Continue  Insulin gargline with 25 units at night and 3 units TID with meals  1/24- will monitor  for trend- is elevated- and change as of tomorrow  1/26- CBGs 98 to 249- pt asking for juices, sweets all the time per team - they are having to limit sugar intake. Will increase meal coverage to 4 units TID  1/28 improving control. Increase semglee to 27u in PM 9.  Hyperthyroid: Recent diagnosis and now on Tapazole             --continue to monitor HR TID.  10. Gastroparesis?: Changed reglan to po route.             --monitor for TDK  1/26- will remove PICC since taking Reglan PO- LBM this AM- is going.  11. H/o anxiety/depression: Continue Lexapro with ativan at bedtime.             --melatonin for sleep wake disruption.  12. Hyponatremia: Monitor Sodium level with serial checks as trending down.              --1/25- Up to 136 - will monitor  13. HTN: SBP goal 130-150 due to severe intracranial atherosclerosis.  --Monitor BP TID. Continue Zestril.   1/28 bp controlled 14. Constipation  1/24- will give sorbitol after therapy and monitor  1/28 had bm after sorbitol 1.27  -continue Senna 2 tabs daily.  Miralax.    I spent a total of  35  minutes on total care today- >50% coordination of care- due to d/w nursing and pt about Bowels, pain and SW  LOS: 5 days A FACE TO FACE EVALUATION WAS PERFORMED  Meredith Staggers 03/03/2021, 11:37 AM

## 2021-03-03 NOTE — Progress Notes (Signed)
Physical Therapy Session Note  Patient Details  Name: Lori [name] MRN: [id number] Date of Birth: Nov 17, 1970  Today's Date: 03/03/2021 PT Individual Time: 7829-5621 PT Individual Time Calculation (min): 56 min   Short Term Goals: Week 1:  PT Short Term Goal 1 (Week 1): Pt will transfer with mod A or better with LRAD PT Short Term Goal 2 (Week 1): Pt will initiate gait training PT Short Term Goal 3 (Week 1): Pt will initiate w/c propulsion  Skilled Therapeutic Interventions/Progress Updates:    Pt received supine in bed and agreeable to therapy session. Supine>sitting R EOB, HOB partially elevated and using bedrail, via logroll technique with min/mod assist primarily to rotate pelvis and then pt able to bring trunk upright using R UE - pt requesting assistance but easily motivated to try when encouraged. L stand pivot EOB>w/c using RW - slow to rise to stand and pushes backs of legs against bed for balance and support while rising - light mod assist for lifting and for managing AD and balance while turning - requires cuing to step back fully to w/c prior to initiating sit.  Transported to/from gym in w/c for time management and energy conservation.  Sitting in w/c retrieved clothes from dryer with CGA for trunk control and assist to manage holding clothing bag.   Gait training 92ft + 26ft + 28ft + 55ft using RW with light mod assist of 1 for AD management and balance with +2 providing close w/c follow - demos narrow BOS (not scissoring) but achieving reciprocal steps with possible slight R lean - able to maintain upright posture with sufficient trunk/hip/knee extension and no signs of knee instability - when fatigued requires fairly quick wheelchair placement to allow seated break.  Transported back to room. Sitting in w/c attempted to fold clothes with pt having significant difficulty due to L UE paresis and hypertonia. Pt requesting to return to bed. R stand pivot w/c>EOB using RW with  light mod assist for rising to stand and min assist for balance while turning - continues to come to stand slowly while pushing backs of legs against w/c requiring therapist to stabilize it for safety and cue for increased anterior weight shift then followed by trunk/hip extension.   Sit>supine, HOB flat but using bedrail, via reverse logroll technique for increased pt independence with mod assist for B LE management onto bed. Pt left supine in bed with needs in reach and bed alarm on.  Therapy Documentation Precautions:  Precautions Precautions: Fall Precaution Comments: L hemi (UE>LE), PICC R UE Restrictions Weight Bearing Restrictions: No   Pain:  No reports of pain throughout session.   Therapy/Group: Individual Therapy  Tawana Scale , PT, DPT, NCS, CSRS 03/03/2021, 3:45 PM

## 2021-03-03 NOTE — Progress Notes (Signed)
Speech Language Pathology Daily Session Note  Patient Details  Name: Lori [name] MRN: [id number] Date of Birth: Nov 27, 1970  Today's Date: 03/03/2021 SLP Individual Time: 0930-1010 SLP Individual Time Calculation (min): 40 min  Short Term Goals: Week 1: SLP Short Term Goal 1 (Week 1): Pt will tolerate regular/thin with Supervision A cues for general swallow precautions. SLP Short Term Goal 2 (Week 1): Pt will recall novel and functional information with min A cues for use of external memory aids SLP Short Term Goal 3 (Week 1): Pt will complete mildly complex problem solving tasks with min A cues for error awareness/repair SLP Short Term Goal 4 (Week 1): Pt will participate in simple alternating attention task with min A cues SLP Short Term Goal 5 (Week 1): Pt will complete medication management/organization task with min A cues  Skilled Therapeutic Interventions: Skilled treatment session focused on cognitive goals. SLP discussed "aspiration event" from yesterday in which the patient reports difficulty with lettuce. SLP provided education regarding swallowing compensatory strategies and aspiration precautions. Patient reported she does not wish to downgrade her diet at this time and will be more deliberate in making safe choices. SLP facilitated session by providing a complex problem solving and organization task in which the patient had to generate a schedule when given a list of appointments, errands and time constraints. SLP had to provide the information in larger print due to decreased visual acuity and assist with writing due to patient reports of inability to write with her RUE. Patient required overall Min-Mod verbal and question cues to complete task accurately and for recall of information provided. Patient requested apple juice at end of session. Patient provided one 4 oz juice but educated on need to reduce juice and snacks in between meals per physician notes. Patient verbalized  understanding. Patient left upright in bed with alarm on and all needs within reach. Continue with current plan of care.      Pain Pain Assessment Pain Scale: 0-10 Pain Score: 0-No pain  Therapy/Group: Individual Therapy  Bernese Doffing 03/03/2021, 12:49 PM

## 2021-03-04 LAB — GLUCOSE, CAPILLARY
Glucose-Capillary: 169 mg/dL — ABNORMAL HIGH (ref 70–99)
Glucose-Capillary: 195 mg/dL — ABNORMAL HIGH (ref 70–99)
Glucose-Capillary: 249 mg/dL — ABNORMAL HIGH (ref 70–99)
Glucose-Capillary: 198 mg/dL — ABNORMAL HIGH (ref 70–99)

## 2021-03-05 DIAGNOSIS — Z8673 Personal history of transient ischemic attack (TIA), and cerebral infarction without residual deficits: Secondary | ICD-10-CM

## 2021-03-05 LAB — CBC WITH DIFFERENTIAL/PLATELET
Abs Immature Granulocytes: 0.01 10*3/uL (ref 0.00–0.07)
Basophils Absolute: 0.1 10*3/uL (ref 0.0–0.1)
Basophils Relative: 1 %
Eosinophils Absolute: 0.1 10*3/uL (ref 0.0–0.5)
Eosinophils Relative: 2 %
HCT: 35.3 % — ABNORMAL LOW (ref 36.0–46.0)
Hemoglobin: 11.8 g/dL — ABNORMAL LOW (ref 12.0–15.0)
Immature Granulocytes: 0 %
Lymphocytes Relative: 43 %
Lymphs Abs: 2.6 10*3/uL (ref 0.7–4.0)
MCH: 27.5 pg (ref 26.0–34.0)
MCHC: 33.4 g/dL (ref 30.0–36.0)
MCV: 82.3 fL (ref 80.0–100.0)
Monocytes Absolute: 0.6 10*3/uL (ref 0.1–1.0)
Monocytes Relative: 9 %
Neutro Abs: 2.7 10*3/uL (ref 1.7–7.7)
Neutrophils Relative %: 45 %
Platelets: 416 10*3/uL — ABNORMAL HIGH (ref 150–400)
RBC: 4.29 MIL/uL (ref 3.87–5.11)
RDW: 16.2 % — ABNORMAL HIGH (ref 11.5–15.5)
WBC: 6 10*3/uL (ref 4.0–10.5)
nRBC: 0 % (ref 0.0–0.2)

## 2021-03-05 LAB — BASIC METABOLIC PANEL
Anion gap: 9 (ref 5–15)
BUN: 5 mg/dL — ABNORMAL LOW (ref 6–20)
CO2: 24 mmol/L (ref 22–32)
Calcium: 8.8 mg/dL — ABNORMAL LOW (ref 8.9–10.3)
Chloride: 106 mmol/L (ref 98–111)
Creatinine, Ser: 0.71 mg/dL (ref 0.44–1.00)
GFR, Estimated: >60 mL/min (ref >60)
Glucose, Bld: 162 mg/dL — ABNORMAL HIGH (ref 70–99)
Potassium: 4.1 mmol/L (ref 3.5–5.1)
Sodium: 139 mmol/L (ref 135–145)

## 2021-03-05 LAB — GLUCOSE, CAPILLARY
Glucose-Capillary: 156 mg/dL — ABNORMAL HIGH (ref 70–99)
Glucose-Capillary: 158 mg/dL — ABNORMAL HIGH (ref 70–99)
Glucose-Capillary: 167 mg/dL — ABNORMAL HIGH (ref 70–99)
Glucose-Capillary: 202 mg/dL — ABNORMAL HIGH (ref 70–99)

## 2021-03-05 NOTE — Plan of Care (Signed)
°  Problem: RH KNOWLEDGE DEFICIT Goal: RH STG INCREASE KNOWLEDGE OF DIABETES Description: Patient and spouse will be able to manage DM with medications and dietary modifications using handouts and educational materials independently Outcome: Not Progressing; non compliance with diet at times

## 2021-03-05 NOTE — Progress Notes (Signed)
Speech Language Pathology Daily Session Note  Patient Details  Name: Lori [name] MRN: [id number] Date of Birth: May 11, 1970  Today's Date: 03/05/2021 SLP Individual Time: 1445-1530 SLP Individual Time Calculation (min): 45 min  Short Term Goals: Week 1: SLP Short Term Goal 1 (Week 1): Pt will tolerate regular/thin with Supervision A cues for general swallow precautions. SLP Short Term Goal 2 (Week 1): Pt will recall novel and functional information with min A cues for use of external memory aids SLP Short Term Goal 3 (Week 1): Pt will complete mildly complex problem solving tasks with min A cues for error awareness/repair SLP Short Term Goal 4 (Week 1): Pt will participate in simple alternating attention task with min A cues SLP Short Term Goal 5 (Week 1): Pt will complete medication management/organization task with min A cues  Skilled Therapeutic Interventions: Skilled ST treatment focused on cognitive goals. Pt supine in bed on arrival and expressed she had asked to be transferred into bed prior to SLP's arrival secondary to nausea and dizziness. Vitals taken and WFL. Blood pressure 115/78. As session progressed patient reported dizziness had resolved but continued to endorse nausea. Nurse was notified. Pt consumed thin liquids w/ straw without overt s/sx of aspiration and with supervision A cues for elevating HOB to upright position prior to intake. SLP facilitated session by providing overall sup A verbal cues for alternating attention between structured cognitive tasks and functional discussion with emphasis on anticipated needs at discharge considering changes s/p most recent CVA. Pt reports having the support system of her children and husband. Patient was left in bed with alarm activated and immediate needs within reach at end of session. Continue per current plan of care.      Pain Pain Assessment Pain Scale: 0-10 Pain Score: 0-No pain  Therapy/Group: Individual  Therapy  Patty Sermons 03/05/2021, 3:02 PM

## 2021-03-05 NOTE — Progress Notes (Signed)
Physical Therapy Session Note  Patient Details  Name: Lori [name] MRN: [id number] Date of Birth: 1970-03-25  Today's Date: 03/05/2021 PT Individual Time: 1302-1400 PT Individual Time Calculation (min): 58 min   Short Term Goals: Week 1:  PT Short Term Goal 1 (Week 1): Pt will transfer with mod A or better with LRAD PT Short Term Goal 2 (Week 1): Pt will initiate gait training PT Short Term Goal 3 (Week 1): Pt will initiate w/c propulsion  Skilled Therapeutic Interventions/Progress Updates:    Pt seated in w/c on arrival and agreeable to therapy. No complaint of pain. Donned shoes tot A. Pt transported to therapy gym for time management and energy conservation.  Session focused on gait with RW. Pt ambulated x 66 ft, 54 ft, 27 ft, 33 ft x 30 ft. Pt required min A and w/c follow for safety. Narrow BOS, poor foot clearance, near knee buckling, occ crossing midline. Pt fatigues quickly, sometimes attempting to sit without warning. Demoes DOE after gait.  Pt then propelled chair with BLE for posterior chain strength x 50 ft. Pt utilized nustep  x 4 min at level 3 for global endurance and strength, as well as integration of reciprocal motion. Required verbal encouragement to keep pace and full ROM. Stand pivot transfer with RW to w/c. Pt transported back to room and was persuaded to remain in w/c with gentle encouragement, pt was left with all needs in reach and alarm active.   Therapy Documentation Precautions:  Precautions Precautions: Fall Precaution Comments: L hemi (UE>LE), PICC R UE Restrictions Weight Bearing Restrictions: No General:     Therapy/Group: Individual Therapy  Mickel Fuchs 03/05/2021, 1:15 PM

## 2021-03-05 NOTE — Progress Notes (Signed)
PROGRESS NOTE   Subjective/Complaints:  Lori [name] has said pt doesn't have disability anymore and supposedly owe $18k back to Lori [name].   Has itching skin on LUE-    ROS: limited by cognition   Objective:   No results found. Recent Labs    03/05/21 0627  WBC 6.0  HGB 11.8*  HCT 35.3*  PLT 416*    Recent Labs    03/05/21 0627  NA 139  K 4.1  CL 106  CO2 24  GLUCOSE 162*  BUN 5*  CREATININE 0.71  CALCIUM 8.8*    No intake or output data in the 24 hours ending 03/05/21 1832       Physical Exam: Vital Signs Blood pressure 115/78, pulse 88, temperature 98.7 F (37.1 C), temperature source Oral, resp. rate 17, height 5\' 2"  (1.575 m), weight 94.8 kg, SpO2 99 %.       General: awake, alert, appropriate, sitting up in bed; OT at bedside; NAD HENT: conjugate gaze; oropharynx moist CV: regular rate; no JVD Pulmonary: CTA B/L; no W/R/R- good air movement GI: soft, NT, ND, (+)BS Psychiatric: appropriate Neurological: alert; precise in diction Ext: no clubbing, cyanosis, or edema Psych: flat  but cooperative  Musculoskeletal: has hyperextension of multiple fingers on L hand at rest- no change    Cervical back: Normal range of motion and neck supple.  Skin:    General: Skin is warm and dry.  Neurological:     Mental Status: She is alert but slow to process.  Follows simple ommands.     Comments: Dysconjugate gaze.     RUE and RLE grossly 4- to 4/5. LUE 3/5 at shoulder,elbow but limited d/t flexor contracture and hypertonicity at left wrist/fingers   RLE 4/5. LLE 3 to 3+/5.  Hyperreflexic on left.  Also with mild inattention to left.   Assessment/Plan: 1. Functional deficits which require 3+ hours per day of interdisciplinary therapy in a comprehensive inpatient rehab setting. Physiatrist is providing close team supervision and 24 hour management of active medical problems listed below. Physiatrist and rehab team  continue to assess barriers to discharge/monitor patient progress toward functional and medical goals  Care Tool:  Bathing    Body parts bathed by patient: Left arm, Chest, Abdomen, Right upper leg, Left upper leg, Front perineal area, Face   Body parts bathed by helper: Buttocks, Right arm, Right lower leg, Left lower leg     Bathing assist Assist Level: Maximal Assistance - Patient 24 - 49%     Upper Body Dressing/Undressing Upper body dressing   What is the patient wearing?: Pull over shirt    Upper body assist Assist Level: Total Assistance - Patient < 25%    Lower Body Dressing/Undressing Lower body dressing      What is the patient wearing?: Incontinence brief, Pants     Lower body assist Assist for lower body dressing: Total Assistance - Patient < 25%     Toileting Toileting    Toileting assist Assist for toileting: Dependent - Patient 0%     Transfers Chair/bed transfer  Transfers assist     Chair/bed transfer assist level: Minimal Assistance - Patient > 75% Chair/bed transfer  assistive device: Programmer, multimedia   Ambulation assist   Ambulation activity did not occur: Safety/medical concerns  Assist level: Moderate Assistance - Patient 50 - 74% Assistive device: Walker-rolling Max distance: 81ft   Walk 10 feet activity   Assist  Walk 10 feet activity did not occur: Safety/medical concerns        Walk 50 feet activity   Assist Walk 50 feet with 2 turns activity did not occur: Safety/medical concerns         Walk 150 feet activity   Assist Walk 150 feet activity did not occur: Safety/medical concerns         Walk 10 feet on uneven surface  activity   Assist Walk 10 feet on uneven surfaces activity did not occur: Safety/medical concerns         Wheelchair     Assist Is the patient using a wheelchair?: Yes Type of Wheelchair: Manual    Wheelchair assist level: Dependent - Patient 0%       Wheelchair 50 feet with 2 turns activity    Assist        Assist Level: Dependent - Patient 0%   Wheelchair 150 feet activity     Assist      Assist Level: Dependent - Patient 0%   Blood pressure 115/78, pulse 88, temperature 98.7 F (37.1 C), temperature source Oral, resp. rate 17, height 5\' 2"  (1.575 m), weight 94.8 kg, SpO2 99 %.  Medical Problem List and Plan: 1. 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  Antithrombotics: -DVT/anticoagulation:  Pharmaceutical: Lovenox             -antiplatelet therapy: Plavix.  3. Pain Management/spasticity: Tylenol prn.              -ROM with therapy             -consider botox, defer to primary team  1/28=continue Voltaren gel for L knee QID  1/30- knee pain is better- con't meds 4. Mood: LCSW to follow for evaluation and support.              -antipsychotic agents: N/A 5. Neuropsych: This patient may be capable of making decisions on her own behalf.             -will need to investigate further with family input.  6. Skin/Wound Care:  Routine pressure relief measures.  7. Fluids/Electrolytes/Nutrition: Monitor I/O. Check CMET in am. 8. T2DM: Hgb A1c-9.0 poorly controlled. Has been on insulin for the past year.              --came in with DKA -->on Glucerna TID BETWEEN meals. Will change to lower carb Ensure max with juven for low protein stores.              --Continue Insulin gargline with 25 units at night and 3 units TID with meals  1/24- will monitor for trend- is elevated- and change as of tomorrow  1/26- CBGs 98 to 249- pt asking for juices, sweets all the time per team - they are having to limit sugar intake. Will increase meal coverage to 4 units  TID  1/28 improving control.  Increase semglee to 27u in PM  1/30- 150s-200 CBGs- doing better- will give 1 more day until change meds.  9.  Hyperthyroid: Recent diagnosis and now on Tapazole             --continue to monitor HR TID.  10. Gastroparesis?: Changed reglan to po route.             --monitor for TDK  1/26- will remove PICC since taking Reglan PO- LBM this AM- is going.  11. H/o anxiety/depression: Continue Lexapro with ativan at bedtime.             --melatonin for sleep wake disruption.  12. Hyponatremia: Monitor Sodium level with serial checks as trending down.              --1/25- Up to 136 - will monitor  13. HTN: SBP goal 130-150 due to severe intracranial atherosclerosis.  --Monitor BP TID. Continue Zestril.   1/28 bp controlled 14. Constipation  1/24- will give sorbitol after therapy and monitor  1/28 had bm after sorbitol 1.27  -continue Senna 2 tabs daily.  Miralax.  43. Dispo  1/30- will write letter for Lori [name] disability in AM- d/w SW   I spent a total of  37 minutes on total care today- >50% coordination of care- due to  d/w pt and SW and OT- about pt's Lori [name] disability  LOS: 7 days A FACE TO FACE EVALUATION WAS PERFORMED  Baljit Liebert 03/05/2021, 6:32 PM

## 2021-03-05 NOTE — Progress Notes (Signed)
Occupational Therapy Session Note  Patient Details  Name: Lori [name] MRN: [id number] Date of Birth: 02-23-1970  Today's Date: 03/05/2021 OT Individual Time: 3570-1779 and 3903-0092 OT Individual Time Calculation (min): 56 min and 43 min   Short Term Goals: Week 1:  OT Short Term Goal 1 (Week 1): Pt will transfer to BSC/toilet with 1 assist and LRAD OT Short Term Goal 2 (Week 1): Pt will don shirt with MOD A with hemitechnique OT Short Term Goal 3 (Week 1): Pt will don pants over hips in standing with MOD A   Skilled Therapeutic Interventions/Progress Updates:    Pt greeted at time of session semireclined bed level, agreeable to OT session and no pain. Pt performing bed mob with Mod A to sit EOB and sit > stand Mod A and Min A for stand pivot transfer to wheelchair. Transported to sink level and pt performing UB bathing with Min A with cues to place LUE on sink and encouraged one hand techniques with R hand. Note at beginning of session MD entered room for rounds and nursing present for med pass with pt sitting bed level. Encouraged pt to perform LB bathing sit > stand at sink but pt refusing getting irritated and stating "I am telling you I cant do that." Pt stating that she is wet and needed brief change and encouraged to change in standing but insisted on bed level. Stand pivot back to bed and brief change total A bed level, pt able to clean periarea with HOB elevated. Pt donning shirt sitting EOB with Max/total for both sports bra and pull over shirt. Donned pants with Max A assist to thread and sit > stand pt attempting to help don over hips. Transferred back to wheelchair and set up alarm on call bell in reach.   Session 2: Pt greeted at time of session bed level just finishing bed level brief change with NT. Pt performing bed mob supine > sit Mod A and sit > stand Min A, encouraged to use RUE to pull up pants fully prior to stand pivot to wheelchair with RW. Pt educated and  demonstration provided on importance of fully turning before sitting and bringing AD with the pt instead of leaving out of her BOS and fall risks. Transported to gym and notified RN of nausea compaints, medication provided during session. Pt performing dynamic standing 3 trials for :45, 2:42, and 3:29 all while performing a 1 handed task in order to improve ability to perform LB ADL in standing. Transported back to room and transferred to bed Min A, sit > supine same manner for LE management. Alarm on call bell in reach.   Therapy Documentation Precautions:  Precautions Precautions: Fall Precaution Comments: L hemi (UE>LE), PICC R UE Restrictions Weight Bearing Restrictions: No     Therapy/Group: Individual Therapy  [name] 03/05/2021, 7:14 AM

## 2021-03-06 DIAGNOSIS — Z8673 Personal history of transient ischemic attack (TIA), and cerebral infarction without residual deficits: Secondary | ICD-10-CM

## 2021-03-06 DIAGNOSIS — I6930 Unspecified sequelae of cerebral infarction: Secondary | ICD-10-CM

## 2021-03-06 LAB — GLUCOSE, CAPILLARY
Glucose-Capillary: 145 mg/dL — ABNORMAL HIGH (ref 70–99)
Glucose-Capillary: 115 mg/dL — ABNORMAL HIGH (ref 70–99)
Glucose-Capillary: 105 mg/dL — ABNORMAL HIGH (ref 70–99)
Glucose-Capillary: 168 mg/dL — ABNORMAL HIGH (ref 70–99)

## 2021-03-06 MED ORDER — INSULIN GLARGINE-YFGN 100 UNIT/ML ~~LOC~~ SOLN
29.0000 [IU] | Freq: Every day | SUBCUTANEOUS | Status: DC
  Administered 2021-03-06 – 2021-03-15 (×10): 29 [IU] via SUBCUTANEOUS
  Filled 2021-03-06 (×11): qty 0.29

## 2021-03-06 NOTE — Progress Notes (Signed)
Physical Therapy Weekly Progress Note  Patient Details  Name: Lori [name] MRN: [id number] Date of Birth: [birth year]  Beginning of progress report period: February 27, 2021 End of progress report period: March 06, 2021  Today's Date: 03/06/2021 PT Individual Time: 1219-7588 PT Individual Time Calculation (min): 58 min   Patient has met 3 of 3 short term goals.  Pt able to transfer with min-mod A consistently with RW, and ambulate x 100 ft with RW and min A.   Patient continues to demonstrate the following deficits muscle weakness, impaired timing and sequencing, abnormal tone, and decreased coordination, and hemiplegia and decreased balance strategies and therefore will continue to benefit from skilled PT intervention to increase functional independence with mobility.  Patient progressing toward long term goals..  Continue plan of care.  PT Short Term Goals Week 1:  PT Short Term Goal 1 (Week 1): Pt will transfer with mod A or better with LRAD PT Short Term Goal 1 - Progress (Week 1): Met PT Short Term Goal 2 (Week 1): Pt will initiate gait training PT Short Term Goal 2 - Progress (Week 1): Met PT Short Term Goal 3 (Week 1): Pt will initiate w/c propulsion PT Short Term Goal 3 - Progress (Week 1): Met Week 2:  PT Short Term Goal 1 (Week 2): =LTGs d/t ELOS  Skilled Therapeutic Interventions/Progress Updates:    pt received in bed and agreeable to therapy, but requesting several minutes to finish eating breakfast. Therapist returned at 8:15. Pt missed 15 minutes of scheduled therapy while eating. Supine>sit with HOB elevated min A to complete trunk elevation. Pt donned shirt, bra, and pants with max A for time. Shoes tot A for time. Pt stood with min A from EOB, pushing with both hands, VC to bring LUE to RW. Min a Stand pivot transfer with RW. Pt transported to hall way for time and energy conservation. Pt ambulated x 107 ft with RW and w/c follow. Pt then propelled w/c with BLE x 50  ft fd and reverse. Poor gait mechanics with flexed hip and knee posture and poor foot clearance, occ crossing midline. Pt performed car transfer with min A for LE management and mod for stand from chair. Pt with near fall when returning to chair, discussed maintaining upright posture and not giving up when starting to fall backwards. Pt then returned to room and remained in w/c, was left with all needs in reach and alarm active.   Therapy Documentation Precautions:  Precautions Precautions: Fall Precaution Comments: L hemi (UE>LE), PICC R UE Restrictions Weight Bearing Restrictions: No General:     Therapy/Group: Individual Therapy  Mickel Fuchs 03/06/2021, 8:33 AM

## 2021-03-06 NOTE — Progress Notes (Signed)
Patient ID: Lori [name], female   DOB: [birth year], 51 y.o.   MRN: [id number]  Met with pt and spoke wit tiffany-via telephone to update team conference goals of min-CGA level of assist and discharge still 2/10. Pt feels she can get a stair lift for daughter's steps. Discussed she would need to get permission from the apartment complex to put one in. [name] did not have any idea what her Mom is talking about. Pt will continue to need 24/7 physical care and will need caregiver or go back to a facility. Pt states: " I'm not going to a facility." Have left another message for her husband to call this worker back. Pt's grandfather is living with her Mom now in [place], so he can be supervised, since wandered and got lost and a sliver alert needed to be placed on him. Will continue to work on safe discharge plan. If she plans to go to daughter's third floor apartment again she will not get home health services since it is not deemed safe for her if she can not go up and down steps.

## 2021-03-06 NOTE — Patient Care Conference (Signed)
Inpatient RehabilitationTeam Conference and Plan of Care Update Date: 03/06/2021   Time: 11:45 AM    Patient Name: Lori [name] [facility]      Medical Record Number: [id number]  Date of Birth: 1970-06-08 Sex: Female         Room/Bed: 4W06C/4W06C-01 Payor Info: Payor: MEDICARE / Plan: MEDICARE PART A / Product Type: *No Product type* /    Admit Date/Time:  02/26/2021  2:07 PM  Primary Diagnosis:  [facility] Problems: Principal Problem:   Debility Active Problems:   History of cerebral infarction    Expected Discharge Date: Expected Discharge Date: 03/16/21  Team Members Present: Physician leading conference: Dr. Courtney Heys Social Worker Present: Ovidio Kin, LCSW Nurse Present: Dorthula Nettles, RN PT Present: Ailene Rud, PT OT Present: Lillia Corporal, OT SLP Present: Charolett Bumpers, SLP PPS Coordinator present : Gunnar Fusi, SLP     Current Status/Progress Goal Weekly Team Focus  Bowel/Bladder   Continent B/B with occassional urinary incontinence LBM 1/29  Regain full continence  Continue timed toileting   Swallow/Nutrition/ Hydration   reg diet, thin liquids  sup A  reg/thin tolerance and safe swallow strategies   ADL's   UB Mod/Max , LB dress Max, stand pivot Min but unsafe and sits prematurely, very slow and self limiting  Min A  standing balance, ADL retraining, endurance, shower level bathing, general conditioning   Mobility   mod bed mobility, min sts and mod spt, min gait up to 66 ft  min A overall  transfers, gait, participation   Communication             Safety/Cognition/ Behavioral Observations  sup-to-min A; likely near cognitive baseline  supervision  problem solving, memory, attention, error awareness   Pain   No c/o pain  remain pain free  assess pain q shift and PRN   Skin   Mild MASD under breasts and groin-scheduled nystatin powder  remain free of new skin breakdown/infection  assess skin q shift and PRn     Discharge Planning:   Unsure what the plan is looking for place to live with husband and now 54 & 63 yo daughter's to move in and be caregivers?? At the moment does not have 24/7 care   Team Discussion: Pain improving. Added Voltaren gel for left knee. CBG's improving, increasing Semglee. Occasionally incontinent bladder. Discharging home with spouse. No current discharge plan in place. At baseline with cognition.   Patient on target to meet rehab goals: Min assist goals. Haven't made it to the toilet d/t constant voiding. Doesn't do things for herself. Has to be motivated. Ambulated 100 ft.  *See Care Plan and progress notes for long and short-term goals.   Revisions to Treatment Plan:  Adjusting medications   Teaching Needs: Family education, medication/diabetes management, bladder management, safety awareness, transfer/gait training, etc.   Current Barriers to Discharge: Decreased caregiver support, Home enviroment access/layout, Incontinence, Lack of/limited family support, Medication compliance, and Behavior  Possible Resolutions to Barriers: Family education Order recommended DME Timed toileting schedule Follow-up PT/OT     Medical Summary Current Status: DKA/debility- continent of B/B per nursing; denies pain except L knee; skin is good- CBGs 150s-200s;  Barriers to Discharge: Decreased family/caregiver support;Home enviroment access/layout;Insurance for SNF coverage;Medical stability;Weight;Nutrition means;Medication compliance;Other (comments)  Barriers to Discharge Comments: back with husband- to get house and kids-pt  has no plan to go home- called silver alert on Grandfather  due to wandering- lost that dispo- and husband -cannot get [employer]  of- Possible Resolutions to [employer]: d/c set for 2/10-  very noncompliant- on diet/CBGs- main limiter- self limiting- max A UB/LB- pees self all the time- needs timed voiding- manipulative of staff;  goals min-mod A;  walked 100 ft with RW- also  not motivated majority of time- d/c 2/10   Continued Need for Acute Rehabilitation Level of Care: The patient requires daily medical management by a physician with specialized training in physical medicine and rehabilitation for the following reasons: Direction of a multidisciplinary physical rehabilitation program to maximize functional independence : Yes Medical management of patient stability for increased activity during participation in an intensive rehabilitation regime.: Yes Analysis of laboratory values and/or radiology reports with any subsequent need for medication adjustment and/or medical intervention. : Yes   I attest that I was present, lead the team conference, and concur with the assessment and plan of the team.   Cristi Loron 03/06/2021, 5:36 PM

## 2021-03-06 NOTE — Progress Notes (Signed)
PROGRESS NOTE   Subjective/Complaints:  Pt reports not ready to eat yet- LBM yesterday- denies constipation-  Still wants a letter for disability.    ROS: limited by cognition   Objective:   No results found. Recent Labs    03/05/21 0627  WBC 6.0  HGB 11.8*  HCT 35.3*  PLT 416*    Recent Labs    03/05/21 0627  NA 139  K 4.1  CL 106  CO2 24  GLUCOSE 162*  BUN 5*  CREATININE 0.71  CALCIUM 8.8*    No intake or output data in the 24 hours ending 03/06/21 1014       Physical Exam: Vital Signs Blood pressure 120/88, pulse 80, temperature 97.7 F (36.5 C), temperature source Oral, resp. rate 18, height 5\' 2"  (1.575 m), weight 94.8 kg, SpO2 99 %.        General: awake, alert, appropriate, laying supine in bed; NAD HENT: dysconjugate gaze; oropharynx moist CV: regular rate; no JVD Pulmonary: CTA B/L; no W/R/R- good air movement GI: soft, NT, ND, (+)BS Psychiatric: appropriate- flat Neurological: alert, precise diction, but very vague Ext: no clubbing, cyanosis, or edema Psych: flat  but cooperative  Musculoskeletal: has hyperextension of multiple fingers on L hand at rest- no change    Cervical back: Normal range of motion and neck supple.  Skin:    General: Skin is warm and dry.  Neurological:     Mental Status: She is alert but slow to process.  Follows simple ommands.     Comments: Dysconjugate gaze.     RUE and RLE grossly 4- to 4/5. LUE 3/5 at shoulder,elbow but limited d/t flexor contracture and hypertonicity at left wrist/fingers   RLE 4/5. LLE 3 to 3+/5.  Hyperreflexic on left.  Also with mild inattention to left.   Assessment/Plan: 1. Functional deficits which require 3+ hours per day of interdisciplinary therapy in a comprehensive inpatient rehab setting. Physiatrist is providing close team supervision and 24 hour management of active medical problems listed below. Physiatrist and rehab  team continue to assess barriers to discharge/monitor patient progress toward functional and medical goals  Care Tool:  Bathing    Body parts bathed by patient: Left arm, Chest, Abdomen, Right upper leg, Left upper leg, Front perineal area, Face   Body parts bathed by helper: Buttocks, Right arm, Right lower leg, Left lower leg     Bathing assist Assist Level: Maximal Assistance - Patient 24 - 49%     Upper Body Dressing/Undressing Upper body dressing   What is the patient wearing?: Pull over shirt    Upper body assist Assist Level: Total Assistance - Patient < 25%    Lower Body Dressing/Undressing Lower body dressing      What is the patient wearing?: Incontinence brief, Pants     Lower body assist Assist for lower body dressing: Total Assistance - Patient < 25%     Toileting Toileting    Toileting assist Assist for toileting: Dependent - Patient 0%     Transfers Chair/bed transfer  Transfers assist     Chair/bed transfer assist level: Minimal Assistance - Patient > 75% Chair/bed transfer assistive device: Gilford Rile  Locomotion Ambulation   Ambulation assist   Ambulation activity did not occur: Safety/medical concerns  Assist level: Moderate Assistance - Patient 50 - 74% Assistive device: Walker-rolling Max distance: 12ft   Walk 10 feet activity   Assist  Walk 10 feet activity did not occur: Safety/medical concerns        Walk 50 feet activity   Assist Walk 50 feet with 2 turns activity did not occur: Safety/medical concerns         Walk 150 feet activity   Assist Walk 150 feet activity did not occur: Safety/medical concerns         Walk 10 feet on uneven surface  activity   Assist Walk 10 feet on uneven surfaces activity did not occur: Safety/medical concerns         Wheelchair     Assist Is the patient using a wheelchair?: Yes Type of Wheelchair: Manual    Wheelchair assist level: Dependent - Patient 0%       Wheelchair 50 feet with 2 turns activity    Assist        Assist Level: Dependent - Patient 0%   Wheelchair 150 feet activity     Assist      Assist Level: Dependent - Patient 0%   Blood pressure 120/88, pulse 80, temperature 97.7 F (36.5 C), temperature source Oral, resp. rate 18, height 5\' 2"  (1.575 m), weight 94.8 kg, SpO2 99 %.  Medical Problem List and Plan: 1. 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  2.  Antithrombotics: -DVT/anticoagulation:  Pharmaceutical: Lovenox             -antiplatelet therapy: Plavix.  3. Pain Management/spasticity: Tylenol prn.              -ROM with therapy             -consider botox, defer to primary team  1/28=continue Voltaren gel for L knee QID  1/31- pain better- con't regimen 4. Mood: LCSW to follow for evaluation and support.              -antipsychotic agents: N/A 5. Neuropsych: This patient may be capable of making decisions on her own behalf.             -will need to investigate further with family input.  6. Skin/Wound Care:  Routine pressure relief measures.  7. Fluids/Electrolytes/Nutrition: Monitor I/O. Check CMET in am. 8. T2DM: Hgb A1c-9.0 poorly controlled. Has been on insulin for the past year.              --came in with DKA -->on Glucerna TID BETWEEN meals. Will change to lower carb Ensure max with juven for low protein stores.              --Continue Insulin gargline with 25 units at night and 3 units TID with meals  1/24- will monitor for trend- is elevated- and change as of tomorrow  1/26- CBGs 98 to 249- pt asking for juices, sweets all the time per team - they are having to limit sugar intake. Will increase meal  coverage to 4 units TID  1/28 improving control. Increase semglee to 27u in PM  1/30- 150s-200 CBGs- doing better- will give 1 more day until change meds.   1/31- CBGs 145- 202- will increase Semglee to 29 units and monitor CBGs 9.  Hyperthyroid: Recent diagnosis and now on Tapazole             --continue to monitor HR TID.  10. Gastroparesis?: Changed reglan to po route.             --monitor for TDK  1/26- will remove PICC since taking Reglan PO- LBM this AM- is going. 1/31- gastroparesis better and stable on Reglan PO- although pt likes better IV.   11. H/o anxiety/depression: Continue Lexapro with ativan at bedtime.             --melatonin for sleep wake disruption.  12. Hyponatremia: Monitor Sodium level with serial checks as trending down.              --1/25- Up to 136 - will monitor  13. HTN: SBP goal 130-150 due to severe intracranial atherosclerosis.  --Monitor BP TID. Continue Zestril.   1/28 bp controlled 14. Constipation  1/24- will give sorbitol after therapy and monitor  1/28 had bm after sorbitol 1.27  -continue Senna 2 tabs daily.  Miralax.  1/31- LBM yesterday- con't to monitor/con't meds 15. Dispo  1/30- will write letter for SS disability in AM- d/w SW   I spent a total of 35   minutes on total care today- >50% coordination of care- due to team conference and writing SS letter.    LOS: 8 days A FACE TO FACE EVALUATION WAS PERFORMED  Monaye Blackie 03/06/2021, 10:14 AM

## 2021-03-06 NOTE — Progress Notes (Signed)
Speech Language Pathology Daily Session Note  Patient Details  Name: Lori [name] MRN: [id number] Date of Birth: November 11, 1970  Today's Date: 03/06/2021 SLP Individual Time: 3361-2244 SLP Individual Time Calculation (min): 42 min  Short Term Goals: Week 1: SLP Short Term Goal 1 (Week 1): Pt will tolerate regular/thin with Supervision A cues for general swallow precautions. SLP Short Term Goal 2 (Week 1): Pt will recall novel and functional information with min A cues for use of external memory aids SLP Short Term Goal 3 (Week 1): Pt will complete mildly complex problem solving tasks with min A cues for error awareness/repair SLP Short Term Goal 4 (Week 1): Pt will participate in simple alternating attention task with min A cues SLP Short Term Goal 5 (Week 1): Pt will complete medication management/organization task with min A cues  Skilled Therapeutic Interventions: Skilled ST treatment focused on cognitive goals. Pt organized TID pillbox with 57% accuracy when task was completed independently. Pt unable to identify 6 errors when cued to double check box for accuracy, but was able to verbally repair once identified by therapist with min A verbal cues. SLP educated strategy to close each flap/door as she added medications to minimize opportunity for error. Pt requested to use bathroom at end of session where she moved to EOB with min A and utilized Stedy with CGA/min A to stand. Patient unable to void bladder, required max A for donning/doffing pants, and returned to bed with min A for bed mobility. Patient was left in bed with alarm activated and immediate needs within reach at end of session. Continue per current plan of care.       Pain Pain Assessment Pain Scale: 0-10 Pain Score: 0-No pain  Therapy/Group: Individual Therapy  Patty Sermons 03/06/2021, 2:36 PM

## 2021-03-06 NOTE — Progress Notes (Signed)
Occupational Therapy Session Note  Patient Details  Name: Lori [name] MRN: [id number] Date of Birth: 1970-02-27  Today's Date: 03/06/2021 OT Individual Time: 1003-1058 OT Individual Time Calculation (min): 55 min    Short Term Goals: Week 1:  OT Short Term Goal 1 (Week 1): Pt will transfer to BSC/toilet with 1 assist and LRAD OT Short Term Goal 2 (Week 1): Pt will don shirt with MOD A with hemitechnique OT Short Term Goal 3 (Week 1): Pt will don pants over hips in standing with MOD A  Skilled Therapeutic Interventions/Progress Updates:    Pt received semi-reclined in bed, agreeable to therapy. Session focus on activity tolerance, transfer retraining, dynamic standing balance in prep for improved ADL/IADL/func mobility performance + decreased caregiver burden. Reports L knee pain, did not rate and declined intervention at this time. Came to sitting EOB with min A to progress LLE off bed. Stand-pivot throughout session with RW and light min A to CGA to power up and facilitate upright posture. Total A w/c transport to and from gym + total A to don B shoes.   Pt participated in 2 rounds of corn hole, side stepping several feet back and forth to retrieve bags on either side. Required frequent rests breaks throughout 2 rounds and often sitting down with uncontrolled descent. Completed 5 sit to stands, progressing from light min A to CGA with no AD.  Completed 5 continues minutes on Nustep at level 3/10 resistance. Educated on importance of self- monitoring fatigue to decrease falls risks. Denies significant fatigue but relates increase in L knee pain. Improves with rest. Stand-pivot back to bed same manner as before and mod A to progress BLE onto bed to return to supine > side-lying.   Pt left side-lying in bed with bed alarm engaged, call bell in reach, and all immediate needs met. 4 bed rails up per pt request.   Therapy Documentation Precautions:  Precautions Precautions:  Fall Precaution Comments: L hemi (UE>LE), PICC R UE Restrictions Weight Bearing Restrictions: No  Pain: see session note   ADL: See Care Tool for more details.   Therapy/Group: Individual Therapy  Volanda Napoleon MS, OTR/L  03/06/2021, 6:50 AM

## 2021-03-06 NOTE — Progress Notes (Signed)
Occupational Therapy Session Note  Patient Details  Name: Lori [name] MRN: [id number] Date of Birth: 09-17-1970  Today's Date: 03/06/2021 OT Individual Time: 5374-8270 OT Individual Time Calculation (min): 44 min    Short Term Goals: Week 1:  OT Short Term Goal 1 (Week 1): Pt will transfer to BSC/toilet with 1 assist and LRAD OT Short Term Goal 2 (Week 1): Pt will don shirt with MOD A with hemitechnique OT Short Term Goal 3 (Week 1): Pt will don pants over hips in standing with MOD A   Skilled Therapeutic Interventions/Progress Updates:    Pt greeted at time of session bed level finishing up lunch, agreeable to OT session and no pain. Note pt self limiting and needing cues to attempt tasks throughout session. Discusion with pt regarding DC date as will remain 2/10. Supine > sit Min/Mod for LE management despite step by step cues. Stand pivot bed > wheelchair CGA/Min A with RW and transferred to [facility] over toilet same manner. After several minutes and unable to void, sit > stand and removed high BSC so feet could rest on floor but still unable to void. Sit > stand and donned new brief and pants over hips Max A with pt stating "please help" before trying to attempt. Pt educated extensively on needing to try herself in prep to go home. Stand pivot back to wheelchair same manner as above. Set up at sink for oral hygiene and hand washign supervision. Max encouragement for pt to stay sitting up in chair but refused saying she is "so tired" and despite max education and encouragement insisted on going to bed. Stand pivot Min A. Alarm on call bell in reach.   Therapy Documentation Precautions:  Precautions Precautions: Fall Precaution Comments: L hemi (UE>LE), PICC R UE Restrictions Weight Bearing Restrictions: No    Therapy/Group: Individual Therapy  [name] 03/06/2021, 12:52 PM

## 2021-03-06 NOTE — Consult Note (Signed)
Neuropsychological Consultation   Patient:   Lori [name]   DOB:   March 13, 1970  MR Number:  [id number]  Location:  [facility] [place] [id number] [place] [address] Dept: [place]: [phone]           Date of Service:   03/05/2021  Start Time:   9 AM End Time:   10 AM  Provider/Observer:  Ilean Skill, Psy.D.       Clinical Neuropsychologist       Billing Code/Service: [phone]  Chief Complaint:    Lori [name] is a 51 year old female with a past history of type 2 diabetes, hypertension, multiple prior strokes with residual aphasia and right-sided motor deficits and then later stroke developing left hemiplegia on 10/22.  Patient is shown flow-limiting stenosis that is felt to be a contributor factor to her repeated strokes.  MRI brain showed multiple old infarcts with moderate volume loss.  After her latest hospitalization patient had been in a skilled nursing facility but was but had repeat admission for nausea vomiting and noted to be in DKA.  Patient admitted to CIR due to significant functional decline.  Reason for Service:  The patient was referred for neuropsychological consultation due to coping and adjustment issues and impact her repeated strokes and significant loss of function have had ulnar overall mood status as well as cognition.  Below is the HPI for the current admission.  HPI: Lori [name] is a 51 year old RH-female with history of T2DM, HTN, multiple prior strokes-[most significant last Jan with aphasia and right sided symptoms and then left hemiplegia since 10/22. She was recently admitted to [facility] on 01/19/21 with inability to speak with BLE weakness and projectile vomiting. CTA head/neck showed atherosclerosis most severe in PCA with L-VA and BA stenosis and possible flow limiting stenosis in right carotid siphon. MRI brain showed showed multiple old infarcts with  moderate volume loss and not acute changes. Dr. Quinn Axe felt patient with recrudescence of symptoms in setting off GI illness and recommended SBP goal 130-150 given severe intracranial stenosis. (Patient familiar to Dr. Quinn Axe from [facility]). Also reports of brain Ca resection in the past. She was also found to have tachycardia due to hyperthyroidism and was started on tapazole.    Her verbal output was improving but continued to be limited by Left hemiplegia with left field cut. She was discharged to [facility] for rehab but has had repeat admission for N/V and noted to be in DKA on most recent admission to [facility] on 02/19/21. She was treated with IV insulin, IVF and IV Cardizem for rate control.  DKA has resolved and insulin being titrated for better control. N/V has resolved with IV reglan. Therapy ongoing and patient showing good participation and eager to return to prior level of function. CIR recommended due to functional decline.    Current Status:  Patient was awake and alert sitting in her wheelchair as I entered the room.  The patient reported that she had been waiting for my arrival.  The patient did have some dysarthria and word finding difficulties noted but was able to effectively communicate and express her self.  Motor deficits were noted.  Patient reports that she feels like she has been making some significant improvements on the rehab unit and reports that the day prior that she had stood up on her own for the first time and quite a long while.  The patient is looking forward to leaving our unit to go home rather than skilled nursing.  She reports that her mood has improved as she is seeing functional gains and is concerned about what will happen to her if she is not able to be at home.  Behavioral Observation: [employer]  presents as a 51 y.o.-year-old Right handed Female who appeared her stated age. her dress was Appropriate and she was Well Groomed and her manners were Appropriate  to the situation.  her participation was indicative of Appropriate and Redirectable behaviors.  There were physical disabilities noted.  she displayed an appropriate level of cooperation and motivation.     Interactions:    Active Redirectable  Attention:   abnormal and attention span appeared shorter than expected for age  Memory:   within normal limits; recent and remote memory intact  Visuo-spatial:  not examined  Speech (Volume):  low  Speech:   slurred; slowed verbal expression with word finding difficulties noted  Thought Process:  Coherent and Relevant  Though Content:  WNL; not suicidal and not homicidal  Orientation:   person, place, time/date, and situation  Judgment:   Fair  Planning:   Fair  Affect:    Flat and Lethargic  Mood:    Dysphoric  Insight:   Fair  Intelligence:   normal  Medical History:   Past Medical History:  Diagnosis Date   CAD (coronary artery disease)    Depression    Diabetes mellitus without complication ([place])    Hypertension    Pheochromocytoma    Pheochromocytoma    Stroke [facility])    Thyroid disease          Patient Active Problem List   Diagnosis Date Noted   History of cerebral infarction    Debility 02/26/2021   DKA (diabetic ketoacidosis) ([place]) 02/19/2021   Constipation    Weakness    Type 2 diabetes mellitus with hyperlipidemia ([place])    Aphasia    Spell of abnormal behavior    Stroke ([place]) 01/19/2021   Tachycardia 01/19/2021   CAD (coronary artery disease) 01/19/2021   CVA (cerebral vascular accident) ([place]) 01/19/2021   Diabetes mellitus without complication ([place])    Hyperthyroidism    Essential hypertension    Pheochromocytoma    Depression    HLD (hyperlipidemia)          Psychiatric History:  Patient has a past history of depression and coping issues with significant cerebrovascular history and challenges with difficult to manage type 2 diabetes.  The patient has had multiple strokes with resulting  expressive aphasia and motor deficits to cope with.  Family Med/Psych History:  Family History  Problem Relation Age of Onset   Diabetes Mother    Diabetes Brother    Brain cancer Brother     Impression/DX:  Giovanni Bath is a 51 year old female with a past history of type 2 diabetes, hypertension, multiple prior strokes with residual aphasia and right-sided motor deficits and then later stroke developing left hemiplegia on 10/22.  Patient is shown flow-limiting stenosis that is felt to be a contributor factor to her repeated strokes.  MRI brain showed multiple old infarcts with moderate volume loss.  After her latest hospitalization patient had been in a skilled nursing facility but was but had repeat admission for nausea vomiting and noted to be in DKA.  Patient admitted to CIR due to significant functional decline.  Patient was awake and alert sitting in her wheelchair as  I entered the room.  The patient reported that she had been waiting for my arrival.  The patient did have some dysarthria and word finding difficulties noted but was able to effectively communicate and express her self.  Motor deficits were noted.  Patient reports that she feels like she has been making some significant improvements on the rehab unit and reports that the day prior that she had stood up on her own for the first time and quite a long while.  The patient is looking forward to leaving our unit to go home rather than skilled nursing.  She reports that her mood has improved as she is seeing functional gains and is concerned about what will happen to her if she is not able to be at home.  Disposition/Plan:  Today we worked on coping and adjustment issues around her residual deficits from her previous strokes and limitations.  The patient reports that her mood has been improving and denied severe depression at this time reporting that she is looking forward to discharge home but is not going to be able to return to  her prior home as it was on the third story and they are working on somewhere without having stairs.  Diagnosis:    History of multiple cerebral infarctions due to severe stenosis and resulting motor deficits and expressive language deficits.         Electronically Signed   _______________________ Ilean Skill, Psy.D. Clinical Neuropsychologist

## 2021-03-07 LAB — URINALYSIS, MICROSCOPIC (REFLEX)

## 2021-03-07 LAB — URINALYSIS, ROUTINE W REFLEX MICROSCOPIC
Bilirubin Urine: NEGATIVE
Glucose, UA: NEGATIVE mg/dL
Ketones, ur: NEGATIVE mg/dL
Nitrite: NEGATIVE
Protein, ur: NEGATIVE mg/dL
Specific Gravity, Urine: 1.02 (ref 1.005–1.030)
pH: 5.5 (ref 5.0–8.0)

## 2021-03-07 LAB — GLUCOSE, CAPILLARY
Glucose-Capillary: 123 mg/dL — ABNORMAL HIGH (ref 70–99)
Glucose-Capillary: 116 mg/dL — ABNORMAL HIGH (ref 70–99)
Glucose-Capillary: 114 mg/dL — ABNORMAL HIGH (ref 70–99)
Glucose-Capillary: 158 mg/dL — ABNORMAL HIGH (ref 70–99)

## 2021-03-07 MED ORDER — CEFDINIR 300 MG PO CAPS
300.0000 mg | ORAL_CAPSULE | Freq: Two times a day (BID) | ORAL | Status: DC
  Administered 2021-03-07: 300 mg via ORAL
  Filled 2021-03-07 (×2): qty 1

## 2021-03-07 MED ORDER — TAMSULOSIN HCL 0.4 MG PO CAPS
0.4000 mg | ORAL_CAPSULE | Freq: Every day | ORAL | Status: DC
  Administered 2021-03-07 – 2021-03-15 (×9): 0.4 mg via ORAL
  Filled 2021-03-07 (×10): qty 1

## 2021-03-07 NOTE — Progress Notes (Signed)
UA ordered this am and was still pending earlier per Dr. Dagoberto Ligas. Patient does have h/o UTI--last treated with ceftriaxone X 7 days 1/4-1/10 for Proteus/Kleb in uring and likely has recurrent infection. May need longer course or different antibiotics. UA did report out and showed small leuc with 21-50 WBC and neg nitrites with few bacteria. However, patient at high risk of recurren infection and with retention , will start her on omnicef empirically.

## 2021-03-07 NOTE — Progress Notes (Signed)
Occupational Therapy Session Note  Patient Details  Name: Lori [name] MRN: [id number] Date of Birth: July 15, 1970  Today's Date: 03/07/2021 OT Individual Time: 1045-1140 OT Individual Time Calculation (min): 55 min    Short Term Goals: Week 1:  OT Short Term Goal 1 (Week 1): Pt will transfer to BSC/toilet with 1 assist and LRAD OT Short Term Goal 2 (Week 1): Pt will don shirt with MOD A with hemitechnique OT Short Term Goal 3 (Week 1): Pt will don pants over hips in standing with MOD A   Skilled Therapeutic Interventions/Progress Updates:    Pt greeted at time of session semireclined in bed resting, agreeable to OT session and no pain. Donned socks bed level with HOB elevated and figure four with MOD A. Supine > sit Mod A as well to fully bring trunk to EOB and frequent multimodal cues to scoot. Donned pants with MOD A with very extended amount of time to thread eventually needing assist to do so and sit > stand to pull over hips needing help posteriorly. Stand pivot to wheelchair CGA/Min with RW. Set up at sink for oral hygiene and face washing tasks. Pt set up in bathroom to transfer to toilet, no BSC today as her feet cannot touch the floor. Stand pivot to commode with CGA/Min , unable to void, and needing assist to wash periarea despite multiple attempts and angles. Multiple attempts for si t> stands from standard commode with cues for form and mechanics but unable, eventually needing Max A to stand from commode. Max A for clothing management back over hips in standing 2/2 fatigue before transferring to wheelchair Min A. Set up in chair alarm on call bell in reach.   Therapy Documentation Precautions:  Precautions Precautions: Fall Precaution Comments: L hemi (UE>LE), PICC R UE Restrictions Weight Bearing Restrictions: No      Therapy/Group: Individual Therapy  [name] 03/07/2021, 7:26 AM

## 2021-03-07 NOTE — Progress Notes (Signed)
PROGRESS NOTE   Subjective/Complaints:  Pt reports hasn't voided since yesterday- they "tried" to cath her 2- but per nursing, she refused - did get cathed this AM for U/A. Cathed for 600cc.   Just cannot pee- even though feels like could.    VFI:EPPIRJJ by cognition   Objective:   No results found. Recent Labs    03/05/21 0627  WBC 6.0  HGB 11.8*  HCT 35.3*  PLT 416*    Recent Labs    03/05/21 0627  NA 139  K 4.1  CL 106  CO2 24  GLUCOSE 162*  BUN 5*  CREATININE 0.71  CALCIUM 8.8*     Intake/Output Summary (Last 24 hours) at 03/07/2021 0946 Last data filed at 03/07/2021 0940 Gross per 24 hour  Intake --  Output 600 ml  Net -600 ml         Physical Exam: Vital Signs Blood pressure 131/82, pulse 81, temperature (!) 97.2 F (36.2 C), temperature source Oral, resp. rate 18, height 5\' 2"  (1.575 m), weight 94.8 kg, SpO2 100 %.         General: awake, alert, appropriate, afebrile; laying supine in bed; learned helplessness noted;  NAD HENT: conjugate gaze; oropharynx moist CV: regular rate; no JVD Pulmonary: CTA B/L; no W/R/R- good air movement GI: soft, NT, ND, (+)BS Psychiatric: appropriate- somewhat interactive Neurological: vague- alert; interactive Ext: no clubbing, cyanosis, or edema Psych: flat  but cooperative  Musculoskeletal: has hyperextension of multiple fingers on L hand at rest- no change    Cervical back: Normal range of motion and neck supple.  Skin:    General: Skin is warm and dry.  Neurological:     Mental Status: She is alert but slow to process.  Follows simple ommands.     Comments: Dysconjugate gaze.     RUE and RLE grossly 4- to 4/5. LUE 3/5 at shoulder,elbow but limited d/t flexor contracture and hypertonicity at left wrist/fingers   RLE 4/5. LLE 3 to 3+/5.  Hyperreflexic on left.  Also with mild inattention to left.   Assessment/Plan: 1. Functional deficits which  require 3+ hours per day of interdisciplinary therapy in a comprehensive inpatient rehab setting. Physiatrist is providing close team supervision and 24 hour management of active medical problems listed below. Physiatrist and rehab team continue to assess barriers to discharge/monitor patient progress toward functional and medical goals  Care Tool:  Bathing    Body parts bathed by patient: Left arm, Chest, Abdomen, Right upper leg, Left upper leg, Front perineal area, Face   Body parts bathed by helper: Buttocks, Right arm, Right lower leg, Left lower leg     Bathing assist Assist Level: Maximal Assistance - Patient 24 - 49%     Upper Body Dressing/Undressing Upper body dressing   What is the patient wearing?: Pull over shirt    Upper body assist Assist Level: Total Assistance - Patient < 25%    Lower Body Dressing/Undressing Lower body dressing      What is the patient wearing?: Incontinence brief, Pants     Lower body assist Assist for lower body dressing: Total Assistance - Patient < 25%  Toileting Toileting    Toileting assist Assist for toileting: Dependent - Patient 0%     Transfers Chair/bed transfer  Transfers assist     Chair/bed transfer assist level: Minimal Assistance - Patient > 75% Chair/bed transfer assistive device: Programmer, multimedia   Ambulation assist   Ambulation activity did not occur: Safety/medical concerns  Assist level: Moderate Assistance - Patient 50 - 74% Assistive device: Walker-rolling Max distance: 64ft   Walk 10 feet activity   Assist  Walk 10 feet activity did not occur: Safety/medical concerns        Walk 50 feet activity   Assist Walk 50 feet with 2 turns activity did not occur: Safety/medical concerns         Walk 150 feet activity   Assist Walk 150 feet activity did not occur: Safety/medical concerns         Walk 10 feet on uneven surface  activity   Assist Walk 10 feet on  uneven surfaces activity did not occur: Safety/medical concerns         Wheelchair     Assist Is the patient using a wheelchair?: Yes Type of Wheelchair: Manual    Wheelchair assist level: Dependent - Patient 0%      Wheelchair 50 feet with 2 turns activity    Assist        Assist Level: Dependent - Patient 0%   Wheelchair 150 feet activity     Assist      Assist Level: Dependent - Patient 0%   Blood pressure 131/82, pulse 81, temperature (!) 97.2 F (36.2 C), temperature source Oral, resp. rate 18, height 5\' 2"  (1.575 m), weight 94.8 kg, SpO2 100 %.  Medical Problem List and Plan: 1. 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  Antithrombotics: -DVT/anticoagulation:  Pharmaceutical: Lovenox             -antiplatelet therapy: Plavix.  3. Pain Management/spasticity: Tylenol prn.              -ROM with therapy             -consider botox, defer to primary team  1/28=continue Voltaren gel for L knee QID  1/31- pain better- con't regimen 4. Mood: LCSW to follow for evaluation and support.              -antipsychotic agents: N/A 5. Neuropsych: This patient may be capable of making decisions on her own behalf.             -will need to investigate further with family input.  6. Skin/Wound Care:  Routine pressure relief measures.  7. Fluids/Electrolytes/Nutrition: Monitor I/O. Check CMET in am. 8. T2DM: Hgb A1c-9.0 poorly controlled. Has been on insulin for the past year.              --came in with DKA -->on Glucerna TID BETWEEN meals. Will change to lower carb Ensure max with juven for low protein stores.              --Continue Insulin gargline with 25 units at night and 3 units TID with meals  1/24- will monitor  for trend-  is elevated- and change as of tomorrow  1/26- CBGs 98 to 249- pt asking for juices, sweets all the time per team - they are having to limit sugar intake. Will increase meal coverage to 4 units TID  1/28 improving control. Increase semglee to 27u in PM  1/30- 150s-200 CBGs- doing better- will give 1 more day until change meds.   1/31- CBGs 145- 202- will increase Semglee to 29 units and monitor CBGs  2/1- CBGs 105-16- much bette-r con't increased Semglee 9.  Hyperthyroid: Recent diagnosis and now on Tapazole             --continue to monitor HR TID.  10. Gastroparesis?: Changed reglan to po route.             --monitor for TDK  1/26- will remove PICC since taking Reglan PO- LBM this AM- is going. 1/31- gastroparesis better and stable on Reglan PO- although pt likes better IV.   11. H/o anxiety/depression: Continue Lexapro with ativan at bedtime.             --melatonin for sleep wake disruption.  12. Hyponatremia: Monitor Sodium level with serial checks as trending down.              --1/25- Up to 136 - will monitor  13. HTN: SBP goal 130-150 due to severe intracranial atherosclerosis.  --Monitor BP TID. Continue Zestril.   1/28 bp controlled 14. Constipation  1/24- will give sorbitol after therapy and monitor  1/28 had bm after sorbitol 1.27  -continue Senna 2 tabs daily.  Miralax.  1/31- LBM yesterday- con't to monitor/con't meds 15. Dispo  1/30- will write letter for SS disability in AM- d/w SW 16. UTI-urinary retention  2/1- cannot void- U/A pending- will treat as soon as know has UTI-    I spent a total of  39  minutes on total care today- >50% coordination of care- due to not urinating- also d/w nursing- refused caths last night x2 so hasn't voided/emptied bladder since yesterday as of this AM- was able to get U/A specimen per nursing.     LOS: 9 days A FACE TO FACE EVALUATION WAS PERFORMED  Tianah Lonardo 03/07/2021, 9:46 AM

## 2021-03-07 NOTE — Progress Notes (Signed)
Occupational Therapy Session Note  Patient Details  Name: Lori [name] MRN: [id number] Date of Birth: 01/15/1971  Today's Date: 03/07/2021 OT Individual Time: 1400-1500 OT Individual Time Calculation (min): 60 min    Short Term Goals: Week 1:  OT Short Term Goal 1 (Week 1): Pt will transfer to BSC/toilet with 1 assist and LRAD OT Short Term Goal 2 (Week 1): Pt will don shirt with MOD A with hemitechnique OT Short Term Goal 3 (Week 1): Pt will don pants over hips in standing with MOD A   Skilled Therapeutic Interventions/Progress Updates:  Pt greeted seated in w/c alseep with her head on table. Pt easily able to arouse and agreeable to OT intervention. Session focus on IADLs, BADL reeducation, functional mobility, dynamic standing balance and decreasing overall caregiver burden.  Pt requested to don face lotion at sink with set- up assist. Pt transported to day room with total A where pt completed dynamic standing balance task with overall CGA using corn hole boards. Pt able to reach out of BOS to retrieve beans bags and toss to target, pt able to stand for 2 mins before needing to sit, pt attempted to collect bean bags with reacher from standing however pt with near LOB as pts LUE slipped off RW when trying to reach down to retrieve bean bag. Pt completed remainder of bean bag retrieval with reacher from sitting.  Pt completed IADL task of putting her clothes in washing machine with pt able to stand from w/c to place clothes in washer with MIN A for balance and MOD cues for safety awareness as pt with tendency to bend knees while reaching into washing machine throwing her off balance. Pt needed to sit down before completing task d/t fatigue, decided that pt would likely need her daughters to assist with laundry and let pt fold clothes. Pt transported back to room with total A where pt completed stand pivot back to EOB with Rw and MIN A. Pt needed MOD A for sit>supine to bring BLEs back to bed.  Pt needed +2 to scoot up in bed. pt left supine in bed with bed alarm activated and all needs within reach.                     Therapy Documentation Precautions:  Precautions Precautions: Fall Precaution Comments: L hemi (UE>LE), PICC R UE Restrictions Weight Bearing Restrictions: No  Pain: no pain reported during session     Therapy/Group: Individual Therapy  Precious Haws 03/07/2021, 3:36 PM

## 2021-03-07 NOTE — Progress Notes (Signed)
Patient did not void this shift with timed toileting. Attempted to cath, patient refused after 2 failed attempts to find urethra. Put patient back on the toilet with no void.

## 2021-03-07 NOTE — Progress Notes (Signed)
Physical Therapy Session Note  Patient Details  Name: Lori [name] MRN: [id number] Date of Birth: March 28, 1970  Today's Date: 03/07/2021 PT Individual Time: 0805-0915, 1300-1330 PT Individual Time Calculation (min): 70 min, 30 min   Short Term Goals: Week 1:  PT Short Term Goal 1 (Week 1): Pt will transfer with mod A or better with LRAD PT Short Term Goal 1 - Progress (Week 1): Met PT Short Term Goal 2 (Week 1): Pt will initiate gait training PT Short Term Goal 2 - Progress (Week 1): Met PT Short Term Goal 3 (Week 1): Pt will initiate w/c propulsion PT Short Term Goal 3 - Progress (Week 1): Met Week 2:  PT Short Term Goal 1 (Week 2): =LTGs d/t ELOS  Skilled Therapeutic Interventions/Progress Updates:    Session 1: pt received in bed and agreeable to therapy. No complaint of pain. Pt demoes supine to sit with mod a and heavy reliance on bed features. Upon sitting, pt requests to use the bathroom. Pt unable to complete initial stand, reporting wrist pain, unrated, that does not require further intervention this session. Following transfers with min A for balance and VC, Stand pivot transfer EOB>w/c<>commode. Pt requires min A for clothing management and extended time, no void. Dependent to don new brief. Pt requests to get dressed at w/c level. Doffs gown with supervision. Dons long sleeve shirt with min A and max encouragement. Mod to thread pants over LLE and pull over hips in standing. Sit to stand throughout session with min-mod A for balance and VC, occ power up. Pt transported to therapy gym for time management and energy conservation. Pt participated in standing cognitive task with 4 column puzzle. On initial attempt, pt was not aware of L half of puzzle. Pt reports she thinks this is worse than it has been. Pt able to stand nearly 2 min, but often sits without warning. Pt able to complete puzzle in several attempts. As little as CGA at times to stand with incr time. Pt returned to room  and remained in w/c, was left with all needs in reach and alarm active.   Session 2: Pt seated in w/c on arrival and agreeable to therapy. No complaint of pain. Pt transported to therapy gym for time management and energy conservation. Session focused on gait with RW and close w/c follow, mod A. Gait x 68 ft, x 30 ft, x 70 ft. Demoes fwd flexed posture, poor foot clearance and narrow BOS, min improvement with VC. Pt with one LOB, able to correct with mod A but became anxious and sat without warning. Discussed need to not sit without warning so therapist can pull chair in and lock brakes. Pt has tendency to do this in many situations. Pt demoes DOE, but O2 WNL. Pt returned to room and remained in w/c, was left with all needs in reach and alarm active.    Therapy Documentation Precautions:  Precautions Precautions: Fall Precaution Comments: L hemi (UE>LE), PICC R UE Restrictions Weight Bearing Restrictions: No General:       Therapy/Group: Individual Therapy  Mickel Fuchs 03/07/2021, 8:25 AM

## 2021-03-07 NOTE — Progress Notes (Signed)
Patient ID: Lori [name], female   DOB: Nov 06, 1970, 51 y.o.   MRN: [id number]  Met with pt per her request, she wanted to know if she could go to her Mom's home at discharge. Discussed the main issue is wherever she goes the person can provide min assist level and be comfortable with her care needs. She reports she lives in a handicapped accessible house. Currently she also has her father there-pt's grandfather since he can not stay alone. Pt will talk with Mom today and get back with this worker

## 2021-03-08 LAB — CBC WITH DIFFERENTIAL/PLATELET
Abs Immature Granulocytes: 0.01 10*3/uL (ref 0.00–0.07)
Basophils Absolute: 0.1 10*3/uL (ref 0.0–0.1)
Basophils Relative: 1 %
Eosinophils Absolute: 0.2 10*3/uL (ref 0.0–0.5)
Eosinophils Relative: 3 %
HCT: 34.8 % — ABNORMAL LOW (ref 36.0–46.0)
Hemoglobin: 11.6 g/dL — ABNORMAL LOW (ref 12.0–15.0)
Immature Granulocytes: 0 %
Lymphocytes Relative: 39 %
Lymphs Abs: 2.8 10*3/uL (ref 0.7–4.0)
MCH: 27.4 pg (ref 26.0–34.0)
MCHC: 33.3 g/dL (ref 30.0–36.0)
MCV: 82.3 fL (ref 80.0–100.0)
Monocytes Absolute: 0.6 10*3/uL (ref 0.1–1.0)
Monocytes Relative: 8 %
Neutro Abs: 3.5 10*3/uL (ref 1.7–7.7)
Neutrophils Relative %: 49 %
Platelets: 364 10*3/uL (ref 150–400)
RBC: 4.23 MIL/uL (ref 3.87–5.11)
RDW: 16 % — ABNORMAL HIGH (ref 11.5–15.5)
WBC: 7.2 10*3/uL (ref 4.0–10.5)
nRBC: 0 % (ref 0.0–0.2)

## 2021-03-08 LAB — GLUCOSE, CAPILLARY
Glucose-Capillary: 130 mg/dL — ABNORMAL HIGH (ref 70–99)
Glucose-Capillary: 160 mg/dL — ABNORMAL HIGH (ref 70–99)
Glucose-Capillary: 86 mg/dL (ref 70–99)
Glucose-Capillary: 200 mg/dL — ABNORMAL HIGH (ref 70–99)

## 2021-03-08 LAB — URINE CULTURE

## 2021-03-08 NOTE — Progress Notes (Signed)
PROGRESS NOTE   Subjective/Complaints:  Pt doesn't have UTI based on U/A- pt asking why cannot void then- did void x1 yesterday, however required caths x2 since last evening.  1 cath for 600cc yesterday afternoon and 200cc this AM- which means not drinking well since was after no void overnight.   ANV:BTYOMAY by cognition   Objective:   No results found. Recent Labs    03/08/21 0526  WBC 7.2  HGB 11.6*  HCT 34.8*  PLT 364    No results for input(s): NA, K, CL, CO2, GLUCOSE, BUN, CREATININE, CALCIUM in the last 72 hours.    Intake/Output Summary (Last 24 hours) at 03/08/2021 0910 Last data filed at 03/08/2021 0700 Gross per 24 hour  Intake 660 ml  Output 800 ml  Net -140 ml         Physical Exam: Vital Signs Blood pressure 129/71, pulse 79, temperature 97.7 F (36.5 C), temperature source Oral, resp. rate 18, height 5\' 2"  (1.575 m), weight 94.8 kg, SpO2 100 %.          General: awake, alert, appropriate, wanted help to push up in bed; supine; NAD HENT: dysconjugate gaze; oropharynx moist CV: regular rate; no JVD Pulmonary: CTA B/L; no W/R/R- good air movement GI: soft, NT, ND, (+)BS Psychiatric: appropriate but very flat Neurological: a little confused on timing of things- alert Ext: no clubbing, cyanosis, or edema  Musculoskeletal: has hyperextension of multiple fingers on L hand at rest- no change    Cervical back: Normal range of motion and neck supple.  Skin:    General: Skin is warm and dry.  Neurological:     Mental Status: She is alert but slow to process.  Follows simple ommands.     Comments: Dysconjugate gaze.     RUE and RLE grossly 4- to 4/5. LUE 3/5 at shoulder,elbow but limited d/t flexor contracture and hypertonicity at left wrist/fingers   RLE 4/5. LLE 3 to 3+/5.  Hyperreflexic on left.  Also with mild inattention to left.   Assessment/Plan: 1. Functional deficits which require 3+  hours per day of interdisciplinary therapy in a comprehensive inpatient rehab setting. Physiatrist is providing close team supervision and 24 hour management of active medical problems listed below. Physiatrist and rehab team continue to assess barriers to discharge/monitor patient progress toward functional and medical goals  Care Tool:  Bathing    Body parts bathed by patient: Left arm, Chest, Abdomen, Right upper leg, Left upper leg, Front perineal area, Face   Body parts bathed by helper: Buttocks, Right arm, Right lower leg, Left lower leg     Bathing assist Assist Level: Maximal Assistance - Patient 24 - 49%     Upper Body Dressing/Undressing Upper body dressing   What is the patient wearing?: Pull over shirt    Upper body assist Assist Level: Total Assistance - Patient < 25%    Lower Body Dressing/Undressing Lower body dressing      What is the patient wearing?: Incontinence brief, Pants     Lower body assist Assist for lower body dressing: Total Assistance - Patient < 25%     Chartered loss adjuster  assist Assist for toileting: Dependent - Patient 0%     Transfers Chair/bed transfer  Transfers assist     Chair/bed transfer assist level: Minimal Assistance - Patient > 75% Chair/bed transfer assistive device: Programmer, multimedia   Ambulation assist   Ambulation activity did not occur: Safety/medical concerns  Assist level: Moderate Assistance - Patient 50 - 74% Assistive device: Walker-rolling Max distance: 84ft   Walk 10 feet activity   Assist  Walk 10 feet activity did not occur: Safety/medical concerns        Walk 50 feet activity   Assist Walk 50 feet with 2 turns activity did not occur: Safety/medical concerns         Walk 150 feet activity   Assist Walk 150 feet activity did not occur: Safety/medical concerns         Walk 10 feet on uneven surface  activity   Assist Walk 10 feet on uneven  surfaces activity did not occur: Safety/medical concerns         Wheelchair     Assist Is the patient using a wheelchair?: Yes Type of Wheelchair: Manual    Wheelchair assist level: Dependent - Patient 0%      Wheelchair 50 feet with 2 turns activity    Assist        Assist Level: Dependent - Patient 0%   Wheelchair 150 feet activity     Assist      Assist Level: Dependent - Patient 0%   Blood pressure 129/71, pulse 79, temperature 97.7 F (36.5 C), temperature source Oral, resp. rate 18, height 5\' 2"  (1.575 m), weight 94.8 kg, SpO2 100 %.  Medical Problem List and Plan: 1. 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  Antithrombotics: -DVT/anticoagulation:  Pharmaceutical: Lovenox             -antiplatelet therapy: Plavix.  3. Pain Management/spasticity: Tylenol prn.              -ROM with therapy             -consider botox, defer to primary team  1/28=continue Voltaren gel for L knee QID  1/31- pain better- con't regimen 4. Mood: LCSW to follow for evaluation and support.              -antipsychotic agents: N/A 5. Neuropsych: This patient may be capable of making decisions on her own behalf.             -will need to investigate further with family input.  6. Skin/Wound Care:  Routine pressure relief measures.  7. Fluids/Electrolytes/Nutrition: Monitor I/O. Check CMET in am. 8. T2DM: Hgb A1c-9.0 poorly controlled. Has been on insulin for the past year.              --came in with DKA -->on Glucerna TID BETWEEN meals. Will change to lower carb Ensure max with juven for low protein stores.              --Continue Insulin gargline with 25 units at night and 3 units TID with  meals  1/24- will monitor for  trend- is elevated- and change as of tomorrow  1/26- CBGs 98 to 249- pt asking for juices, sweets all the time per team - they are having to limit sugar intake. Will increase meal coverage to 4 units TID  2/2- CBGs doing much better with increase in Semglee- con't regimen- nursing having to stop pt from drinking juices all the time.  9.  Hyperthyroid: Recent diagnosis and now on Tapazole             --continue to monitor HR TID.  10. Gastroparesis?: Changed reglan to po route.             --monitor for TDK  1/26- will remove PICC since taking Reglan PO- LBM this AM- is going. 1/31- gastroparesis better and stable on Reglan PO- although pt likes better IV.   11. H/o anxiety/depression: Continue Lexapro with ativan at bedtime.             --melatonin for sleep wake disruption.  12. Hyponatremia: Monitor Sodium level with serial checks as trending down.              --1/25- Up to 136 - will monitor  2/2- Na 139- con't to monitor  13. HTN: SBP goal 130-150 due to severe intracranial atherosclerosis.  --Monitor BP TID. Continue Zestril.   1/28 bp controlled 14. Constipation 2/2- LBM yesterday- con't to monitor 15. Dispo  2/2- will write disability letter 23. urinary retention  2/1- cannot void- U/A pending- will treat as soon as know has UTI-   2/2- gave 1 dose of ABX yesterday but U/A (-)- started flomax yesterday- explained will take a few days and hopefully help- if continues, will need Urology outpt f/u- explained to pt that is an outpt w/u.    I spent a total of  38  minutes on total care today- >50% coordination of care- due to writing disability letter for pt   LOS: 10 days A FACE TO FACE EVALUATION WAS PERFORMED  Colden Samaras 03/08/2021, 9:10 AM

## 2021-03-08 NOTE — Progress Notes (Signed)
Occupational Therapy Weekly Progress Note  Patient Details  Name: Lori [name] MRN: [id number] Date of Birth: May 27, 1970  Beginning of progress report period: February 27, 2021 End of progress report period: March 08, 2021   Today's Date: 03/08/2021 OT Individual Time: 7124-5809 OT Individual Time Calculation (min): 72 min    Patient has met 1 of 3 short term goals.  Pt has made progress toward ADL transfer goals, performing with Min/CGA but has been significantly limited by lack of motivation and self limiting behaviors. Pt is still Max A with LB dressing, Mod/Max with UB dress, and frequently will request help before attempting. Planning for upcoming DC home with family (husband, daughters) with recommendation for 24/7 supervision and will need physical assist for ADLs at time of DC.  Patient continues to demonstrate the following deficits: muscle weakness, decreased cardiorespiratoy endurance, impaired timing and sequencing, abnormal tone, unbalanced muscle activation, decreased coordination, and decreased motor planning, decreased motor planning, decreased initiation, decreased attention, decreased awareness, decreased problem solving, decreased safety awareness, decreased memory, and delayed processing, and decreased sitting balance, decreased standing balance, decreased postural control, hemiplegia, and decreased balance strategies and therefore will continue to benefit from skilled OT intervention to enhance overall performance with BADL and Reduce care partner burden.  Patient not progressing toward long term goals.  See goal revision..  Plan of care revisions: downgraded ADLs to MOD A and transfers CGA.  OT Short Term Goals Week 1:  OT Short Term Goal 1 (Week 1): Pt will transfer to BSC/toilet with 1 assist and LRAD OT Short Term Goal 1 - Progress (Week 1): Met OT Short Term Goal 2 (Week 1): Pt will don shirt with MOD A with hemitechnique OT Short Term Goal 2 - Progress (Week  1): Progressing toward goal OT Short Term Goal 3 (Week 1): Pt will don pants over hips in standing with MOD A OT Short Term Goal 3 - Progress (Week 1): Progressing toward goal Week 2:  OT Short Term Goal 1 (Week 2): STG = LTG 2/2 LOS at Min/Mod  Skilled Therapeutic Interventions/Progress Updates:    Pt greeted at time of session semireclined bed level calling for nursing staff stating "I pooped" and when questioned pt said it "surprised her" and she didn't know she was going. Pt still agreeable to shower level bathing this session, doffed brief with rolling Min A and total A for hygiene. Did not don new brief in prep for shower. Supine > sit from flat bed Max A and stand pivot to wheelchair Min A with RW. Transported to bathroom and while performind stand pivot to shower bench against back wall, pt "tripping" and sitting on edge of bench. MOD A to reposition to sit further back on bench, poor safety awareness with transfer. Educated on importance of fully backing up prior to sitting an pt able to verbalize understanding with limited follow through. Pt stating at this time "I feel uneasy" and no longer wanting to shower. Set up at sink instead and while bathing needing to toilet urgently, stand pivot wheelchair <> BSC over toilet with CGA/Min but unable to void. Resumed bathing at sink with max encouragement needed for pt to attempt tasks first before having assist, bathing overall with Mod/Max A. Pt drying off seated and performing UB dress with Mod/Max and LB dress with total A at sit > stand level. Up in chair alarm on call bell in reach.     Therapy Documentation Precautions:  Precautions Precautions: Fall Precaution Comments: L  hemi (UE>LE), PICC R UE Restrictions Weight Bearing Restrictions: No     Therapy/Group: Individual Therapy  [name] 03/08/2021, 7:17 AM

## 2021-03-08 NOTE — Progress Notes (Signed)
Pt did not spontaneously void this shift, I & O cath this am for 200 ml, pt tolerated well.

## 2021-03-08 NOTE — Plan of Care (Signed)
°  Problem: RH BOWEL ELIMINATION Goal: RH STG MANAGE BOWEL WITH ASSISTANCE Description: STG Manage Bowel with mod I Assistance. Outcome: Not ; incontinence   Problem: RH BLADDER ELIMINATION Goal: RH STG MANAGE BLADDER WITH ASSISTANCE Description: STG Manage Bladder With min Assistance Outcome: Not Progressing; incontinence

## 2021-03-08 NOTE — Evaluation (Signed)
Recreational Therapy Assessment and Plan  Patient Details  Name: Lori [name] MRN: [id number] Date of Birth: 11-24-1970 Today's Date: 03/08/2021  Rehab Potential:  Good ELOS:   d/c 2/10  Assessment Hospital Problem: Principal Problem:   Debility     Past Medical History:      Past Medical History:  Diagnosis Date   CAD (coronary artery disease)     Depression     Diabetes mellitus without complication ([place])     Hypertension     Pheochromocytoma     Pheochromocytoma     Stroke [facility])     Thyroid disease      Past Surgical History:       Past Surgical History:  Procedure Laterality Date   CORONARY ARTERY BYPASS GRAFT       Right adrenal gland removal for pheochromocytoma Right         Clinical Impression: Cheryl Chay is a 51 year old RH-female with history of T2DM, HTN, multiple prior strokes-[most significant last Jan with aphasia and right sided symptoms and then left hemiplegia since 10/22. She was recently admitted to [facility] on 01/19/21 with inability to speak with BLE weakness and projectile vomiting. CTA head/neck showed atherosclerosis most severe in PCA with L-VA and BA stenosis and possible flow limiting stenosis in right carotid siphon. MRI brain showed showed multiple old infarcts with moderate volume loss and not acute changes. Dr. Quinn Axe felt patient with recrudescence of symptoms in setting off GI illness and recommended SBP goal 130-150 given severe intracranial stenosis. (Patient familiar to Dr. Quinn Axe from [facility]). Also reports of brain Ca resection in the past. She was also found to have tachycardia due to hyperthyroidism and was started on tapazole.    Her verbal output was improving but continued to be limited by Left hemiplegia with left field cut. She was discharged to [facility] for rehab but has had repeat admission for N/V and noted to be in DKA on most recent admission to [facility] on 02/19/21. She was treated with IV insulin, IVF and IV Cardizem for rate  control.  DKA has resolved and insulin being titrated for better control. N/V has resolved with IV reglan. Therapy ongoing and patient showing good participation and eager to return to prior level of function. CIR recommended due to functional decline.  Patient transferred to CIR on 02/26/2021 .     Pt presents with decreased activity tolerance, decreased functional mobility, decreased balance, decreased initiation, decreased attention, decreased awareness, decreased problem solving, decreased safety awareness, decreased memory, and delayed processing Limiting pt's independence with leisure/community pursuits.   Met with pt today to discuss TR services including leisure education, activity analysis/modifications and stress management.  Also discussed the importance of social, emotional, spiritual health in addition to physical health and their effects on overall health and wellness.  Pt stated understanding.   Plan  Min 1 TR session >20 minutes during LOS  Recommendations for other services: None   Discharge Criteria: Patient will be discharged from TR if patient refuses treatment 3 consecutive times without medical reason.  If treatment goals not met, if there is a change in medical status, if patient makes no progress towards goals or if patient is discharged from hospital.  The above assessment, treatment plan, treatment alternatives and goals were discussed and mutually agreed upon: by patient  Session note:  No c/o.  Pt participated in group session with a focus on community reintegration, energy conservation techniques, safe community mobility.  Pt shares  that she wants to be able to go out to eat as well as attend her daughter's graduation in May. Pt transported down to atrium from w/c with total A.Discussed energy conservation techniques in relation to community reintegration.  Discuss pre planning and importance of choosing the appropriate locations such as going to familiar places at  first. Pt performed stand pivot transfer from w/c--> booth seat with RW and CGA. Pt needed MIN A however to rise from low seat and pivot back to w/c. During transfer noted that pts pants were wet. Pt preferred to return to room to check brief with OTA.  [place] 03/08/2021, 8:37 AM

## 2021-03-08 NOTE — Progress Notes (Signed)
Physical Therapy Session Note  Patient Details  Name: Lori [name] MRN: [id number] Date of Birth: 03-05-1970  Today's Date: 03/08/2021 PT Individual Time: 1101-1157 PT Individual Time Calculation (min): 56 min   Short Term Goals: Week 1:  PT Short Term Goal 1 (Week 1): Pt will transfer with mod A or better with LRAD PT Short Term Goal 1 - Progress (Week 1): Met PT Short Term Goal 2 (Week 1): Pt will initiate gait training PT Short Term Goal 2 - Progress (Week 1): Met PT Short Term Goal 3 (Week 1): Pt will initiate w/c propulsion PT Short Term Goal 3 - Progress (Week 1): Met Week 2:  PT Short Term Goal 1 (Week 2): =LTGs d/t ELOS  Skilled Therapeutic Interventions/Progress Updates:    pt received in bed and agreeable to therapy. No complaint of pain. Bed mobility with facilitation and Verbal cues. Sit to stand and Stand pivot transfer to w/c with min A, pt better able to maintain knee extension in standing today. Pt transported to therapy gym for time management and energy conservation. Stand pivot transfer <>nustep with min A and RW. Nustep x 12 min at level 6 for improved strength and endurance. Some L shoulder pain, relieved with not using for activity. Pt reported extreme LE fatigue so focused on UE NMR. Transitioned to sitting reaching activity with horseshoes for improved sitting balance, reaching outside BOS, and use of LUE. Pt transported back to room and remained in w/c, was left with all needs in reach and alarm active.   Therapy Documentation Precautions:  Precautions Precautions: Fall Precaution Comments: L hemi (UE>LE), PICC R UE Restrictions Weight Bearing Restrictions: No General:       Therapy/Group: Individual Therapy  Mickel Fuchs 03/08/2021, 11:23 AM

## 2021-03-08 NOTE — Progress Notes (Signed)
Occupational Therapy Session Note  Patient Details  Name: Lori [name] MRN: [id number] Date of Birth: 06-Mar-1970  Today's Date: 03/08/2021 OT Individual Time: 1451-1530 OT Individual Time Calculation (min): 39 min  and Today's Date: 03/08/2021 OT Group Time: 1420-1450 OT Group Time Calculation (min): 30 min   Short Term Goals: Week 2:  OT Short Term Goal 1 (Week 2): STG = LTG 2/2 LOS at Min/Mod  Skilled Therapeutic Interventions/Progress Updates:  Group session: Pt participated in group session with a focus on community re-entry, energy conservation strategies (ECS) community mobility, and iADLs. Pt reports he wants to be able to re-enter the community by going out to eat and going to her daughters graduation in May. Pt transported down to atrium from w/c with total A.Discussed energy conservation strategies in relation to community re-entry such as always having a plan and going to familiar places at first then trying new places as pt feels more comfortable. Pt able to complete stand pivot transfer from w/c> booth seat with RW and CGA. Pt needed MIN A however to rise from low seat and pivot back to w/c. During transfer noted that pts pants were wet. Pt preferred to return to room to check brief.  Individual session:  Pt transported back to room where pt completed stand pivot from w/c>toilet with Rw and CGA. Pt unable to void bladder and brief was dry ( pt may have sat in wet). Changed brief and pants from toilet with MAX A. Pt was able to assist with pulling up LB clothing on R side but needed assist with L side. Encouraged pt to walk out to w/c in room but pt reports fatigue therefore pulled w/c into bathroom and pt completed stand pivot from toilet>w/c with rw and CGA. Pt transported to bed where pt completed additional stand pivot to EOB with Rw and CGA. MOD A for sit>supine needing assist to elevate BLEs. Pt left supine in bed with bed alarm activated and all needs within reach.   Therapy  Documentation Precautions:  Precautions Precautions: Fall Precaution Comments: L hemi (UE>LE), PICC R UE Restrictions Weight Bearing Restrictions: No    Pain: no pain reported during session     Therapy/Group: Individual Therapy and Group Therapy  Corinne Ports [facility] 03/08/2021, 3:55 PM

## 2021-03-08 NOTE — Progress Notes (Signed)
Occupational Therapy Session Note  Patient Details  Name: Lori [name] MRN: [id number] Date of Birth: Aug 26, 1970  Today's Date: 03/09/2021 OT Individual Time: 1133-1208 and 1655-3748 OT Individual Time Calculation (min): 35 min and 37 min 23 minutes missed  Skilled Therapeutic Interventions/Progress Updates:    Pt greeted in bed, requesting assistance with detangling hair. Supine<sit completed with significantly increased time, vcs, and Max A overall (HOB elevated and using the bedrail). Once EOB, worked on sitting balance while pt assisted OT in combing/brushing hair. Vcs to maximize functional independence during task and also to be more mindful of sitting balance, pt with a few posterior LOBs requiring vcs and assist to recover. At end of session vcs and Mod A to return to bed. Min A to stabilize feet while she bridged to obtain better alignment in bed. Left her with all needs within reach and bed alarm set.   2nd Session 1:1 tx (37 minutes) Pt greeted in bed with c/o HA. RN in during session to provide pain medicine. OT also provided her with lavender aromatherapy for some relief as well. Pt appreciative. Discussed calming activities that would help to relieve HA discomfort. Pt agreeable to sit up to engage in a coloring activity, also requested maybe trying some "zen" music to listen to. OT retrieved needed supplies. Max A for supine<sit with vcs. Once EOB, worked on anterior weight shifting to improve balance as pt tends to lose balance posteriorly. OT assisted in incorporating the Lt UE to stabilize marker when uncapped/capped. During coloring pt reported still not feeling well, requesting to terminate session early. CGA for lateral scooting up towards [place] with vcs before she returned to supine (with supervision). Pt able to bridge to reposition helps for optimal alignment. OT assisted pt in sidelying position for comfort using pillows. Pt remained in bed at close of session, all needs  within reach and bed alarm set. Tx time missed due to headache/fatigue.  Therapy Documentation Precautions:  Precautions Precautions: Fall Precaution Comments: L hemi (UE>LE), PICC R UE Restrictions Weight Bearing Restrictions: No Pain: Lt hand pain during 1st session, assisted pt with repositioning for comfort at end of session Pain Assessment Pain Scale: 0-10 Pain Score: 0-No pain ADL: ADL Eating: Not assessed Grooming: Minimal assistance Upper Body Bathing: Moderate assistance Lower Body Bathing: Maximal assistance Upper Body Dressing: Maximal assistance Lower Body Dressing: Dependent Toileting: Dependent Therapy/Group: Individual Therapy  Carilyn Woolston A Lahari Suttles 03/09/2021, 12:31 PM

## 2021-03-09 LAB — URINALYSIS, ROUTINE W REFLEX MICROSCOPIC
Bilirubin Urine: NEGATIVE
Glucose, UA: NEGATIVE mg/dL
Ketones, ur: NEGATIVE mg/dL
Nitrite: NEGATIVE
Protein, ur: NEGATIVE mg/dL
Specific Gravity, Urine: 1.025 (ref 1.005–1.030)
pH: 5.5 (ref 5.0–8.0)

## 2021-03-09 LAB — GLUCOSE, CAPILLARY
Glucose-Capillary: 133 mg/dL — ABNORMAL HIGH (ref 70–99)
Glucose-Capillary: 126 mg/dL — ABNORMAL HIGH (ref 70–99)
Glucose-Capillary: 87 mg/dL (ref 70–99)
Glucose-Capillary: 160 mg/dL — ABNORMAL HIGH (ref 70–99)

## 2021-03-09 LAB — URINALYSIS, MICROSCOPIC (REFLEX)

## 2021-03-09 MED ORDER — FOSFOMYCIN TROMETHAMINE 3 G PO PACK
3.0000 g | PACK | Freq: Once | ORAL | Status: AC
  Administered 2021-03-09: 3 g via ORAL
  Filled 2021-03-09: qty 3

## 2021-03-09 NOTE — Progress Notes (Signed)
PROGRESS NOTE   Subjective/Complaints:  Pt reports still needing caths- 3x/since yesterday. Even though they got her up to [facility] to try and pee yesterday.   Will recheck U/A and Cx since had multiple species AND there's no other reason she would stop voiding.    [id number] by cognition   Objective:   No results found. Recent Labs    03/08/21 0526  WBC 7.2  HGB 11.6*  HCT 34.8*  PLT 364    No results for input(s): NA, K, CL, CO2, GLUCOSE, BUN, CREATININE, CALCIUM in the last 72 hours.    Intake/Output Summary (Last 24 hours) at 03/09/2021 0841 Last data filed at 03/09/2021 0302 Gross per 24 hour  Intake 720 ml  Output 300 ml  Net 420 ml         Physical Exam: Vital Signs Blood pressure 117/79, pulse 72, temperature 98.7 F (37.1 C), temperature source Oral, resp. rate 14, height 5\' 2"  (1.575 m), weight 96.3 kg, SpO2 98 %.           General: awake, alert, appropriate, not talking as much this AM;  NAD HENT: dysconjugate gaze; oropharynx moist CV: regular rate; no JVD Pulmonary: CTA B/L; no W/R/R- good air movement GI: soft, NT, ND, (+)BS Psychiatric: appropriate- less interactive this AM Neurological: confused on timing of things- alert otherwise Ext: no clubbing, cyanosis, or edema  Musculoskeletal: has hyperextension of multiple fingers on L hand at rest- no change    Cervical back: Normal range of motion and neck supple.  Skin:    General: Skin is warm and dry.  Neurological:     Mental Status: She is alert but slow to process.  Follows simple ommands.     Comments: Dysconjugate gaze.     RUE and RLE grossly 4- to 4/5. LUE 3/5 at shoulder,elbow but limited d/t flexor contracture and hypertonicity at left wrist/fingers   RLE 4/5. LLE 3 to 3+/5.  Hyperreflexic on left.  Also with mild inattention to left.   Assessment/Plan: 1. Functional deficits which require 3+ hours per day of  interdisciplinary therapy in a comprehensive inpatient rehab setting. Physiatrist is providing close team supervision and 24 hour management of active medical problems listed below. Physiatrist and rehab team continue to assess barriers to discharge/monitor patient progress toward functional and medical goals  Care Tool:  Bathing    Body parts bathed by patient: Left arm, Chest, Abdomen, Right upper leg, Left upper leg, Front perineal area, Face, Buttocks   Body parts bathed by helper: Buttocks, Right arm, Right lower leg, Left lower leg     Bathing assist Assist Level: Maximal Assistance - Patient 24 - 49%     Upper Body Dressing/Undressing Upper body dressing   What is the patient wearing?: Pull over shirt    Upper body assist Assist Level: Maximal Assistance - Patient 25 - 49%    Lower Body Dressing/Undressing Lower body dressing      What is the patient wearing?: Incontinence brief, Pants     Lower body assist Assist for lower body dressing: Total Assistance - Patient < 25%     Toileting Toileting    Toileting assist Assist for  toileting: Dependent - Patient 0%     Transfers Chair/bed transfer  Transfers assist     Chair/bed transfer assist level: Minimal Assistance - Patient > 75% Chair/bed transfer assistive device: Programmer, multimedia   Ambulation assist   Ambulation activity did not occur: Safety/medical concerns  Assist level: Moderate Assistance - Patient 50 - 74% Assistive device: Walker-rolling Max distance: 27ft   Walk 10 feet activity   Assist  Walk 10 feet activity did not occur: Safety/medical concerns        Walk 50 feet activity   Assist Walk 50 feet with 2 turns activity did not occur: Safety/medical concerns         Walk 150 feet activity   Assist Walk 150 feet activity did not occur: Safety/medical concerns         Walk 10 feet on uneven surface  activity   Assist Walk 10 feet on uneven surfaces  activity did not occur: Safety/medical concerns         Wheelchair     Assist Is the patient using a wheelchair?: Yes Type of Wheelchair: Manual    Wheelchair assist level: Dependent - Patient 0%      Wheelchair 50 feet with 2 turns activity    Assist        Assist Level: Dependent - Patient 0%   Wheelchair 150 feet activity     Assist      Assist Level: Dependent - Patient 0%   Blood pressure 117/79, pulse 72, temperature 98.7 F (37.1 C), temperature source Oral, resp. rate 14, height 5\' 2"  (1.575 m), weight 96.3 kg, SpO2 98 %.  Medical Problem List and Plan: 1. 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  Antithrombotics: -DVT/anticoagulation:  Pharmaceutical: Lovenox             -antiplatelet therapy: Plavix.  3. Pain Management/spasticity: Tylenol prn.              -ROM with therapy             -consider botox, defer to primary team  1/28=continue Voltaren gel for L knee QID  1/31- pain better- con't regimen 4. Mood: LCSW to follow for evaluation and support.              -antipsychotic agents: N/A 5. Neuropsych: This patient may be capable of making decisions on her own behalf.             -will need to investigate further with family input.  6. Skin/Wound Care:  Routine pressure relief measures.  7. Fluids/Electrolytes/Nutrition: Monitor I/O. Check CMET in am. 8. T2DM: Hgb A1c-9.0 poorly controlled. Has been on insulin for the past year.              --came in with DKA -->on Glucerna TID BETWEEN meals. Will change to lower carb Ensure max with juven for low protein stores.              --Continue Insulin gargline with 25 units at night and 3 units TID with meals  1/24- will monitor for trend-  is elevated- and change as  of tomorrow  1/26- CBGs 98 to 249- pt asking for juices, sweets all the time per team - they are having to limit sugar intake. Will increase meal coverage to 4 units TID  2/2- CBGs doing much better with increase in Semglee- con't regimen- nursing having to stop pt from drinking juices all the time.   2/3- CBGs keeps going up - likely snacks/juices- 86-200- will con't regimen for now 9.  Hyperthyroid: Recent diagnosis and now on Tapazole             --continue to monitor HR TID.  10. Gastroparesis?: Changed reglan to po route.             --monitor for TDK  1/26- will remove PICC since taking Reglan PO- LBM this AM- is going. 1/31- gastroparesis better and stable on Reglan PO- although pt likes better IV.   11. H/o anxiety/depression: Continue Lexapro with ativan at bedtime.             --melatonin for sleep wake disruption.  12. Hyponatremia: Monitor Sodium level with serial checks as trending down.              --1/25- Up to 136 - will monitor  2/2- Na 139- con't to monitor  13. HTN: SBP goal 130-150 due to severe intracranial atherosclerosis.  --Monitor BP TID. Continue Zestril.   1/28 bp controlled 14. Constipation 2/2- LBM yesterday- con't to monitor 15. Dispo  2/2- will write disability letter 48. urinary retention  2/1- cannot void- U/A pending- will treat as soon as know has UTI-   2/2- gave 1 dose of ABX yesterday but U/A (-)- started flomax yesterday- explained will take a few days and hopefully help- if continues, will need Urology outpt f/u- explained to pt that is an outpt w/u.   2/3- will recheck U/A and Cx- even though last one was (-), I don't have a reason she just stopped voiding- and Cx showed multiple species- will recollect.     LOS: 11 days A FACE TO FACE EVALUATION WAS PERFORMED  Ganesh Deeg 03/09/2021, 8:41 AM

## 2021-03-09 NOTE — Progress Notes (Signed)
Physical Therapy Session Note  Patient Details  Name: Lori [name] MRN: [id number] Date of Birth: Feb 17, 1970  Today's Date: 03/09/2021 PT Individual Time: 0902-1002 PT Individual Time Calculation (min): 60 min   Short Term Goals: Week 1:  PT Short Term Goal 1 (Week 1): Pt will transfer with mod A or better with LRAD PT Short Term Goal 1 - Progress (Week 1): Met PT Short Term Goal 2 (Week 1): Pt will initiate gait training PT Short Term Goal 2 - Progress (Week 1): Met PT Short Term Goal 3 (Week 1): Pt will initiate w/c propulsion PT Short Term Goal 3 - Progress (Week 1): Met Week 2:  PT Short Term Goal 1 (Week 2): =LTGs d/t ELOS  Skilled Therapeutic Interventions/Progress Updates:    pt received in bed and agreeable to therapy. No complaint of pain. Nsg present for meds pass. Bed mobility with min A for instruction and cueing. Donned shirt and pants with mod A for time. Shoes tot A. Sit to stand throughout session with min A to RW, requiring frequent cueing for hand placement. Stand pivot transfer <> w/c with min A and VC. Pt requires extended time for all tasks d/t slow speed and poor initiation. Pt transported to therapy gym for time management and energy conservation. Pt participated in [place] for improved standing tolerance and dynamic balance. Pt able to stand for first 3 frames, but then would not attempt to stand for longer than 1 frame in following attempts. Min A for balance and consistent encouragement to participate. Pt returned to room and to bed in the same manner. Handed off to nsg for IO cath.    Therapy Documentation Precautions:  Precautions Precautions: Fall Precaution Comments: L hemi (UE>LE), PICC R UE Restrictions Weight Bearing Restrictions: No     Therapy/Group: Individual Therapy  Mickel Fuchs 03/09/2021, 11:54 AM

## 2021-03-09 NOTE — Progress Notes (Signed)
Speech Language Pathology Weekly Progress and Session Note  Patient Details  Name: Lori [name] MRN: [id number] Date of Birth: 1970/10/17  Beginning of progress report period: March 01, 2021 End of progress report period: March 09, 2021  Today's Date: 03/09/2021 SLP Individual Time: 1445-1530 SLP Individual Time Calculation (min): 45 min  Short Term Goals: Week 1: SLP Short Term Goal 1 (Week 1): Pt will tolerate regular/thin with Supervision A cues for general swallow precautions. SLP Short Term Goal 1 - Progress (Week 1): Met SLP Short Term Goal 2 (Week 1): Pt will recall novel and functional information with min A cues for use of external memory aids SLP Short Term Goal 2 - Progress (Week 1): Met SLP Short Term Goal 3 (Week 1): Pt will complete mildly complex problem solving tasks with min A cues for error awareness/repair SLP Short Term Goal 3 - Progress (Week 1): Met SLP Short Term Goal 4 (Week 1): Pt will participate in simple alternating attention task with min A cues SLP Short Term Goal 4 - Progress (Week 1): Met SLP Short Term Goal 5 (Week 1): Pt will complete medication management/organization task with min A cues SLP Short Term Goal 5 - Progress (Week 1): Met    New Short Term Goals: Week 2: SLP Short Term Goal 1 (Week 2): STG=LTG due to ELOS  Weekly Progress Updates: Patient has made steady gains and has met 5 of 5 STGs this reporting period. Currently, requires overall min A verbal/visual cues to complete functional and complex tasks accurately and safely in regards to attention, higher level problem solving, functional recall, awareness, and medication management tasks. Pt is currently consuming a regular diet and thin liquids with mod I for implementation for safe swallowing strategies. Patient and family education is ongoing. Patient would benefit from continued skilled SLP intervention to maximize cognitive functioning and overall functional independence prior to  discharge.    Intensity: Minumum of 1-2 x/day, 30 to 90 minutes Frequency: 3 to 5 out of 7 days Duration/Length of Stay: 2/10 Treatment/Interventions: Cognitive remediation/compensation;Cueing hierarchy;Dysphagia/aspiration precaution training;Functional tasks;Internal/external aids;Patient/family education;Therapeutic Activities;Therapeutic Exercise   Daily Session Skilled Therapeutic Interventions: Skilled ST treatment focused on cognitive and swallowing goals. Pt observed consuming regular textures with effective mastication and without overt s/sx of aspiration with mod I for implementation of safe swallowing strategies. Pt was able to recall and report strategies independently. Continue regular diet, thin liquids. Pt completed functional memory and attention tasks by recalling novel word lists based on attributes, inclusion, and exclusion criteria with sup A to achieve 80% accuracy. SLP educated on beneficial internal strategies including repetition, association, and chunking in which patient utilized during task with sup-to-min A cues. Patient was left in bed with alarm activated and immediate needs within reach at end of session. Continue per current plan of care.      General    Pain Pain Assessment Pain Scale: 0-10 Pain Score: 0-No pain Pain Type: Acute pain Pain Location: Head Pain Orientation: Posterior Pain Descriptors / Indicators: Headache Pain Frequency: Constant Pain Onset: On-going Patients Stated Pain Goal: 2 Pain Intervention(s): Medication (See eMAR) Multiple Pain Sites: No  Therapy/Group: Individual Therapy  Patty Sermons 03/09/2021, 4:54 PM

## 2021-03-09 NOTE — Progress Notes (Signed)
Patient ID: Lori [name], female   DOB: [birth year], 51 y.o.   MRN: [id number]  Met with pt to discuss discharge plan. She reports her husband is moving in to the rental home this weekend. Asked who will be her caregiver while he works, their 73 yo daughter is also moving in with them. She reports husband and daughter will be her caregiver, aware of her need for 24/7 physical care. Will need to set up family education next week and tried calling husband while in pt's room with speaker phone. He did not answer and voice mail ws full, sent text to ask him to call this worker to set up education this coming week. Pt will need to get her hospital bed moved from [place] apartment to the rental home with husband. She is aware of this. Will continue to work on discharge plan. Await return call from husband.

## 2021-03-10 LAB — URINE CULTURE: Culture: NO GROWTH

## 2021-03-10 LAB — GLUCOSE, CAPILLARY
Glucose-Capillary: 139 mg/dL — ABNORMAL HIGH (ref 70–99)
Glucose-Capillary: 126 mg/dL — ABNORMAL HIGH (ref 70–99)
Glucose-Capillary: 157 mg/dL — ABNORMAL HIGH (ref 70–99)
Glucose-Capillary: 97 mg/dL (ref 70–99)

## 2021-03-10 NOTE — Progress Notes (Signed)
Occupational Therapy Session Note  Patient Details  Name: Lori [name] MRN: [id number] Date of Birth: February 21, 1970  Today's Date: 03/11/2021 OT Individual Time: 7207-2182 OT Individual Time Calculation (min): 53 min   Skilled Therapeutic Interventions/Progress Updates:    Pt greeted in bed, receiving medicine from RN including topical analgesic for her Lt hand. Supine<sit completed with Max A, vcs, and increased time. Pt repeating "please help me" during transition EOB. Min A for stand pivot<w/c using RW. She was then set up for UB bathing/dressing w/c level at the sink. Pt required increased time and vcs to complete tasks at max level of independence. Note that she is very slow to execute motor demands of tasks and often stops to ask for help. One handed strategies for washing her Rt side, also for completing oral care (with vcs). OT tried to facilitate Lt hand as gross stabilizer however pt reported that this was too painful. She requested to return to bed afterwards, would not ambulate, opted to instead do a stand pivot. Stand pivot<bed completed with Min A using RW with vcs. Supervision for returning to supine and to bridge to reposition hips. Assisted pt in sidelying position for comfort. She asked for aromatherapy to increase relaxation during her day, which was provided. Pt remained in bed, setting up zoom to watch her church service. Tx focus placed on activity tolerance, adaptive self care skills, OOB tolerance, and transfers.   Therapy Documentation Precautions:  Precautions Precautions: Fall Precaution Comments: L hemi (UE>LE), PICC R UE Restrictions Weight Bearing Restrictions: No ADL: ADL Eating: Not assessed Grooming: Minimal assistance Upper Body Bathing: Moderate assistance Lower Body Bathing: Maximal assistance Upper Body Dressing: Maximal assistance Lower Body Dressing: Dependent Toileting: Dependent   Therapy/Group: Individual Therapy  [name] A  [name] 03/11/2021, 12:58 PM

## 2021-03-10 NOTE — Progress Notes (Signed)
Speech Language Pathology Daily Session Note  Patient Details  Name: Sorrel Cassetta MRN: [id number] Date of Birth: Aug 21, 1970  Today's Date: 03/10/2021 SLP Individual Time: 1346-1415 SLP Individual Time Calculation (min): 29 min  Short Term Goals: Week 2: SLP Short Term Goal 1 (Week 2): STG=LTG due to ELOS  Skilled Therapeutic Interventions: Pt seen for skilled ST with focus on cognitive goals, pt initially lethargic but participates thoroughly in tasks. SLP facilitating mildly complex alternating attention task providing Supervision A cues for 90% accuracy. Pt able to recall novel word lists with 5-10 min delay with average 75% accuracy. Pt detailing discharge plans and re-educated on recommendations for 24/7 supervision/assist. Pt left in bed with alarm set and with specific needs placed in front of her per request, cont ST POC.   Pain Pain Assessment Pain Scale: 0-10 Pain Score: 0-No pain  Therapy/Group: Individual Therapy  Dewaine Conger 03/10/2021, 2:44 PM

## 2021-03-10 NOTE — Progress Notes (Signed)
PROGRESS NOTE   Subjective/Complaints: Expresses no new complaints Lying down Nursing notes that +left hand pain   ROS: limited by cognition   Objective:   No results found. Recent Labs    03/08/21 0526  WBC 7.2  HGB 11.6*  HCT 34.8*  PLT 364    No results for input(s): NA, K, CL, CO2, GLUCOSE, BUN, CREATININE, CALCIUM in the last 72 hours.    Intake/Output Summary (Last 24 hours) at 03/10/2021 1232 Last data filed at 03/10/2021 0815 Gross per 24 hour  Intake 297 ml  Output --  Net 297 ml         Physical Exam: Vital Signs Blood pressure 105/82, pulse 72, temperature 97.7 F (36.5 C), temperature source Oral, resp. rate 18, height 5\' 2"  (1.575 m), weight 96.3 kg, SpO2 99 %. Gen: no distress, normal appearing HEENT: oral mucosa pink and moist, NCAT Cardio: Reg rate Chest: normal effort, normal rate of breathing Abd: soft, non-distended Ext: no edema Psych: pleasant, normal affect Skin: intact  Musculoskeletal: has hyperextension of multiple fingers on L hand at rest- no change    Cervical back: Normal range of motion and neck supple.  Skin:    General: Skin is warm and dry.  Neurological:     Mental Status: She is alert but slow to process.  Follows simple ommands.     Comments: Dysconjugate gaze.     RUE and RLE grossly 4- to 4/5. LUE 3/5 at shoulder,elbow but limited d/t flexor contracture and hypertonicity at left wrist/fingers   RLE 4/5. LLE 3 to 3+/5.  Hyperreflexic on left.  Also with mild inattention to left.   Assessment/Plan: 1. Functional deficits which require 3+ hours per day of interdisciplinary therapy in a comprehensive inpatient rehab setting. Physiatrist is providing close team supervision and 24 hour management of active medical problems listed below. Physiatrist and rehab team continue to assess barriers to discharge/monitor patient progress toward functional and medical  goals  Care Tool:  Bathing    Body parts bathed by patient: Left arm, Chest, Abdomen, Right upper leg, Left upper leg, Front perineal area, Face, Buttocks   Body parts bathed by helper: Buttocks, Right arm, Right lower leg, Left lower leg     Bathing assist Assist Level: Maximal Assistance - Patient 24 - 49%     Upper Body Dressing/Undressing Upper body dressing   What is the patient wearing?: Pull over shirt    Upper body assist Assist Level: Maximal Assistance - Patient 25 - 49%    Lower Body Dressing/Undressing Lower body dressing      What is the patient wearing?: Incontinence brief, Pants     Lower body assist Assist for lower body dressing: Total Assistance - Patient < 25%     Toileting Toileting    Toileting assist Assist for toileting: Dependent - Patient 0%     Transfers Chair/bed transfer  Transfers assist     Chair/bed transfer assist level: Minimal Assistance - Patient > 75% Chair/bed transfer assistive device: Programmer, multimedia   Ambulation assist   Ambulation activity did not occur: Safety/medical concerns  Assist level: Moderate Assistance - Patient 50 - 74%  Assistive device: Walker-rolling Max distance: 62ft   Walk 10 feet activity   Assist  Walk 10 feet activity did not occur: Safety/medical concerns        Walk 50 feet activity   Assist Walk 50 feet with 2 turns activity did not occur: Safety/medical concerns         Walk 150 feet activity   Assist Walk 150 feet activity did not occur: Safety/medical concerns         Walk 10 feet on uneven surface  activity   Assist Walk 10 feet on uneven surfaces activity did not occur: Safety/medical concerns         Wheelchair     Assist Is the patient using a wheelchair?: Yes Type of Wheelchair: Manual    Wheelchair assist level: Dependent - Patient 0%      Wheelchair 50 feet with 2 turns activity    Assist        Assist Level:  Dependent - Patient 0%   Wheelchair 150 feet activity     Assist      Assist Level: Dependent - Patient 0%   Blood pressure 105/82, pulse 72, temperature 97.7 F (36.5 C), temperature source Oral, resp. rate 18, height 5\' 2"  (1.575 m), weight 96.3 kg, SpO2 99 %.  Medical Problem List and Plan: 1. 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  Antithrombotics: -DVT/anticoagulation:  Pharmaceutical: Lovenox             -antiplatelet therapy: Plavix.  3. Pain Management/spasticity: Tylenol prn.              -ROM with therapy             -consider botox, defer to primary team  1/28=continue Voltaren gel for L knee QID  1/31- pain better- con't regimen 4. Mood: LCSW to follow for evaluation and support.              -antipsychotic agents: N/A 5. Neuropsych: This patient may be capable of making decisions on her own behalf.             -will need to investigate further with family input.  6. Skin/Wound Care:  Routine pressure relief measures.  7. Fluids/Electrolytes/Nutrition: Monitor I/O. Check CMET in am. 8. T2DM: Hgb A1c-9.0 poorly controlled. Has been on insulin for the past year.              --came in with DKA -->on Glucerna TID BETWEEN meals. Will change to lower carb Ensure max with juven for low protein stores.              --Continue Insulin gargline with 25 units at night and 3 units TID with meals  1/24- will monitor for trend- is elevated- and change as of tomorrow  1/26- CBGs 98 to 249- pt asking for juices, sweets all the time per team - they are having to limit sugar intake. Will increase meal coverage to 4 units TID  2/2- CBGs doing much better with increase in Semglee- con't regimen- nursing having to stop pt from  drinking juices all the time.   2/3- CBGs  keeps going up - likely snacks/juices- 86-200- will con't regimen for now 9.  Hyperthyroid: Recent diagnosis and now on Tapazole             --continue to monitor HR TID.  10. Gastroparesis?: Changed reglan to po route.             --monitor for TDK  1/26- will remove PICC since taking Reglan PO- LBM this AM- is going. 1/31- gastroparesis better and stable on Reglan PO- although pt likes better IV.   11. H/o anxiety/depression: Continue Lexapro with ativan at bedtime.             --melatonin for sleep wake disruption.  12. Hyponatremia: Monitor Sodium level with serial checks as trending down.              --1/25- Up to 136 - will monitor  2/2- Na 139- con't to monitor  13. HTN: SBP goal 130-150 due to severe intracranial atherosclerosis.  --Monitor BP TID. Continue Zestril.   1/28 bp controlled 14. Constipation 2/2- LBM yesterday- con't to monitor  2/4: continue daily senna 15. Dispo  2/2- will write disability letter 30. urinary retention  2/1- cannot void- U/A pending- will treat as soon as know has UTI-   2/2- gave 1 dose of ABX yesterday but U/A (-)- started flomax yesterday- explained will take a few days and hopefully help- if continues, will need Urology outpt f/u- explained to pt that is an outpt w/u.   2/3- will recheck U/A and Cx- even though last one was (-), I don't have a reason she just stopped voiding- and Cx showed multiple species- will recollect.   2/4: UA appears negative, f/u UC 17. Left hand pain: placed order to ice.     LOS: 12 days A FACE TO FACE EVALUATION WAS PERFORMED  Clide Deutscher Denetra Formoso 03/10/2021, 12:32 PM

## 2021-03-11 LAB — GLUCOSE, CAPILLARY
Glucose-Capillary: 113 mg/dL — ABNORMAL HIGH (ref 70–99)
Glucose-Capillary: 145 mg/dL — ABNORMAL HIGH (ref 70–99)
Glucose-Capillary: 101 mg/dL — ABNORMAL HIGH (ref 70–99)

## 2021-03-11 NOTE — Progress Notes (Signed)
Occupational Therapy Session Note ° °Patient Details  °Name: Lori [name] °MRN: [id number] °Date of Birth: 10/03/1970 ° °Today's Date: 03/12/2021 °OT Individual Time: 1305-1352 °OT Individual Time Calculation (min): 47 min  °28 minutes missed ° °Short Term Goals: °Week 1:  OT Short Term Goal 1 (Week 1): Pt will transfer to BSC/toilet with 1 assist and LRAD °OT Short Term Goal 1 - Progress (Week 1): Met °OT Short Term Goal 2 (Week 1): Pt will don shirt with MOD A with hemitechnique °OT Short Term Goal 2 - Progress (Week 1): Progressing toward goal °OT Short Term Goal 3 (Week 1): Pt will don pants over hips in standing with MOD A °OT Short Term Goal 3 - Progress (Week 1): Progressing toward goal ° °Skilled Therapeutic Interventions/Progress Updates:  °  Pt greeted in bed, reporting that she had an intense headache. RN in and provided pain medicine at start of session. OT dimmed the lights/closed blinds to address therapeutically, also provided pt with aromatherapy per pt request. She asked to listen to relaxing music. Agreeable to engage in light mobility EOB to work on sitting balance + UE/LE NMR during music listening. Max A and vcs for supine<sit. Guided pt through gentle tai chi exercises for B UEs, modifications made due to hemiparesis, vcs for incorporating diaphragmatic breathing with movement. Also worked on gentle mobility for B LEs. Pt then reported that she forgot to ask nursing to assist with brief change. Pt scooted up towards HOB with vcs, transitioned to supine with vcs as well and bridged with Min A (for positioning/stabilizing Lt LE) to improve hip alignment. Pt completed frontal perihygiene with setup but needed A for reaching backside due to body habitus. Min A to roll Rt>Lt + total A for brief change. She requested to terminate session early, stating that she wanted "no noise" to improve headache. Pt assisted with repositioning to protect hemiplegic side. Left pt with all needs within reach and  bed alarm set. Time missed due to HA.  ° °Therapy Documentation °Precautions:  °Precautions °Precautions: Fall °Precaution Comments: L hemi (UE>LE), PICC R UE °Restrictions °Weight Bearing Restrictions: No °ADL: °ADL °Eating: Not assessed °Grooming: Minimal assistance °Upper Body Bathing: Moderate assistance °Lower Body Bathing: Maximal assistance °Upper Body Dressing: Maximal assistance °Lower Body Dressing: Dependent °Toileting: Dependent ° °Therapy/Group: Individual Therapy ° °Michaela A Hoffman °03/12/2021, 4:10 PM °

## 2021-03-11 NOTE — Progress Notes (Signed)
Physical Therapy Session Note  Patient Details  Name: Lori [name] MRN: [id number] Date of Birth: October 06, 1970  Today's Date: 03/11/2021 PT Amount of Missed Time (min): 60 Minutes PT Missed Treatment Reason: Patient unwilling to participate  Short Term Goals: Week 2:  PT Short Term Goal 1 (Week 2): =LTGs d/t ELOS  Skilled Therapeutic Interventions/Progress Updates:    Attempted to see patient for scheduled therapy session. Pt received supine in bed in the middle of her Sunday worship. Pt declines any participation in therapy at this time due to performing worship. Offered to return later this PM as schedule allows, pt declines as she prefers to spend her Sunday worshipping. Pt missed 60 min of scheduled therapy session to allow time to worship.  Therapy Documentation Precautions:  Precautions Precautions: Fall Precaution Comments: L hemi (UE>LE), PICC R UE Restrictions Weight Bearing Restrictions: No General: PT Amount of Missed Time (min): 60 Minutes PT Missed Treatment Reason: Patient unwilling to participate       Therapy/Group: Individual Therapy   Excell Seltzer, PT, DPT, CSRS  03/11/2021, 12:02 PM

## 2021-03-12 LAB — CBC WITH DIFFERENTIAL/PLATELET
Abs Immature Granulocytes: 0.03 10*3/uL (ref 0.00–0.07)
Basophils Absolute: 0.1 10*3/uL (ref 0.0–0.1)
Basophils Relative: 1 %
Eosinophils Absolute: 0.2 10*3/uL (ref 0.0–0.5)
Eosinophils Relative: 3 %
HCT: 34.5 % — ABNORMAL LOW (ref 36.0–46.0)
Hemoglobin: 11.2 g/dL — ABNORMAL LOW (ref 12.0–15.0)
Immature Granulocytes: 0 %
Lymphocytes Relative: 34 %
Lymphs Abs: 2.3 10*3/uL (ref 0.7–4.0)
MCH: 26.9 pg (ref 26.0–34.0)
MCHC: 32.5 g/dL (ref 30.0–36.0)
MCV: 82.7 fL (ref 80.0–100.0)
Monocytes Absolute: 0.6 10*3/uL (ref 0.1–1.0)
Monocytes Relative: 8 %
Neutro Abs: 3.7 10*3/uL (ref 1.7–7.7)
Neutrophils Relative %: 54 %
Platelets: 355 10*3/uL (ref 150–400)
RBC: 4.17 MIL/uL (ref 3.87–5.11)
RDW: 15.9 % — ABNORMAL HIGH (ref 11.5–15.5)
WBC: 6.8 10*3/uL (ref 4.0–10.5)
nRBC: 0 % (ref 0.0–0.2)

## 2021-03-12 LAB — BASIC METABOLIC PANEL
Anion gap: 9 (ref 5–15)
BUN: 6 mg/dL (ref 6–20)
CO2: 20 mmol/L — ABNORMAL LOW (ref 22–32)
Calcium: 8.8 mg/dL — ABNORMAL LOW (ref 8.9–10.3)
Chloride: 109 mmol/L (ref 98–111)
Creatinine, Ser: 0.77 mg/dL (ref 0.44–1.00)
GFR, Estimated: >60 mL/min (ref >60)
Glucose, Bld: 119 mg/dL — ABNORMAL HIGH (ref 70–99)
Potassium: 4.7 mmol/L (ref 3.5–5.1)
Sodium: 138 mmol/L (ref 135–145)

## 2021-03-12 LAB — GLUCOSE, CAPILLARY
Glucose-Capillary: 110 mg/dL — ABNORMAL HIGH (ref 70–99)
Glucose-Capillary: 184 mg/dL — ABNORMAL HIGH (ref 70–99)
Glucose-Capillary: 81 mg/dL (ref 70–99)
Glucose-Capillary: 127 mg/dL — ABNORMAL HIGH (ref 70–99)

## 2021-03-12 NOTE — Progress Notes (Signed)
Speech Language Pathology Daily Session Note  Patient Details  Name: Lori [name] MRN: [id number] Date of Birth: 24-Jan-1971  Today's Date: 03/12/2021 SLP Individual Time: 1000-1100 SLP Individual Time Calculation (min): 60 min  Short Term Goals: Week 2: SLP Short Term Goal 1 (Week 2): STG=LTG due to ELOS  Skilled Therapeutic Interventions: Skilled ST treatment focused on cognitive goals. SLP facilitated session by providing overall sup A verbal cues for generating appropriate solutions and problem solving with hypothetical scenarios pertaining to medication management. Patient exhibited effective reasoning and safety awareness with responses. Pt recalled having made several mistakes while practicing TID pillbox organization in previous a ST treatment session and was in agreement with having someone assist/supervise medication management at discharge. SLP also assessed money counting and basic math via ALFA assessment in which patient scored [facility] and without errors (10/10). Patient was left in wheelchair with alarm activated and immediate needs within reach at end of session. Continue per current plan of care.      Pain Pain Assessment Pain Scale: 0-10 Pain Score: 0-No pain  Therapy/Group: Individual Therapy  [name] T Emmelia Holdsworth 03/12/2021, 11:03 AM

## 2021-03-12 NOTE — Progress Notes (Signed)
Patient ID: Lori [name], female   DOB: 1970/11/27, 51 y.o.   MRN: [id number]  [place] with pt's husband after numerous calls back and forth to set up education this week for Wed 2/8 from 1:00-3:30. He is aware she require 24/7 care at discharge. Asked for him to bring in 64 yo daughter also if she will be assisting her at home.

## 2021-03-12 NOTE — Progress Notes (Signed)
Physical Therapy Session Note  Patient Details  Name: Lori [name] MRN: [id number] Date of Birth: 10-11-1970  Today's Date: 03/12/2021 PT Individual Time: 0900-0959 PT Individual Time Calculation (min): 59 min   Short Term Goals: Week 1:  PT Short Term Goal 1 (Week 1): Pt will transfer with mod A or better with LRAD PT Short Term Goal 1 - Progress (Week 1): Met PT Short Term Goal 2 (Week 1): Pt will initiate gait training PT Short Term Goal 2 - Progress (Week 1): Met PT Short Term Goal 3 (Week 1): Pt will initiate w/c propulsion PT Short Term Goal 3 - Progress (Week 1): Met Week 2:  PT Short Term Goal 1 (Week 2): =LTGs d/t ELOS  Skilled Therapeutic Interventions/Progress Updates:    pt received in bed and agreeable to therapy. No complaint of pain.  Pt participated in dependent brief change with min A rolling. Sup>sit with min A and multimodal cueing for placement and technique. Pt then requested to change shirt, doffed with supervision and donned shirt and pants mod A for time. Multiple attempts to stand with VC for technique before performing min A Stand pivot transfer with RW. Pt washed face at w/c level with supervision. Pt transported to therapy gym for time management and energy conservation. Gait training x 85 ft with min A for RW steering and w/c follow for safety. Pt continues to sit unpredictably, even after extensive education. Pt demoes narrow BOS and low foot clearance, min improvement with VC. Pt terminated gait activity d/t L wrist pain, requested tylenol from RN. Pt then self propelled w/c with BLE x 50 ft fwd and backward for Le strength and endurance. Transported back to room and pt performed Sit to stand to RWE x 5 min A fading to CGA after VC and encouragement. Pt remained in w/c to await ST session and was left with all needs in reach and alarm active.   Therapy Documentation Precautions:  Precautions Precautions: Fall Precaution Comments: L hemi (UE>LE), PICC R  UE Restrictions Weight Bearing Restrictions: No    Therapy/Group: Individual Therapy  Mickel Fuchs 03/12/2021, 9:27 AM

## 2021-03-12 NOTE — Progress Notes (Signed)
PROGRESS NOTE   Subjective/Complaints: Pt reports LBM this AM- no complaints     ROS: limited by cognition   Objective:   No results found. Recent Labs    03/12/21 0603  WBC 6.8  HGB 11.2*  HCT 34.5*  PLT 355    Recent Labs    03/12/21 0603  NA 138  K 4.7  CL 109  CO2 20*  GLUCOSE 119*  BUN 6  CREATININE 0.77  CALCIUM 8.8*      Intake/Output Summary (Last 24 hours) at 03/12/2021 1910 Last data filed at 03/12/2021 1900 Gross per 24 hour  Intake 240 ml  Output --  Net 240 ml         Physical Exam: Vital Signs Blood pressure 110/75, pulse 86, temperature 97.7 F (36.5 C), temperature source Oral, resp. rate 18, height 5\' 2"  (1.575 m), weight 96.3 kg, SpO2 100 %.   General: awake, alert, appropriate, NAD HENT: conjugate gaze; oropharynx moist CV: regular rate; no JVD Pulmonary: CTA B/L; no W/R/R- good air movement GI: soft, NT, ND, (+)BS Psychiatric: appropriate- flat Neurological: vague; not speaking much this AM Skin: intact  Musculoskeletal: has hyperextension of multiple fingers on L hand at rest- no change    Cervical back: Normal range of motion and neck supple.  Skin:    General: Skin is warm and dry.  Neurological:     Mental Status: She is alert but slow to process.  Follows simple ommands.     Comments: Dysconjugate gaze.     RUE and RLE grossly 4- to 4/5. LUE 3/5 at shoulder,elbow but limited d/t flexor contracture and hypertonicity at left wrist/fingers   RLE 4/5. LLE 3 to 3+/5.  Hyperreflexic on left.  Also with mild inattention to left.   Assessment/Plan: 1. Functional deficits which require 3+ hours per day of interdisciplinary therapy in a comprehensive inpatient rehab setting. Physiatrist is providing close team supervision and 24 hour management of active medical problems listed below. Physiatrist and rehab team continue to assess barriers to discharge/monitor patient  progress toward functional and medical goals  Care Tool:  Bathing    Body parts bathed by patient: Left arm, Chest, Abdomen, Right upper leg, Left upper leg, Front perineal area, Face, Buttocks   Body parts bathed by helper: Buttocks, Right arm, Right lower leg, Left lower leg     Bathing assist Assist Level: Maximal Assistance - Patient 24 - 49%     Upper Body Dressing/Undressing Upper body dressing   What is the patient wearing?: Pull over shirt    Upper body assist Assist Level: Maximal Assistance - Patient 25 - 49%    Lower Body Dressing/Undressing Lower body dressing      What is the patient wearing?: Incontinence brief, Pants     Lower body assist Assist for lower body dressing: Total Assistance - Patient < 25%     Toileting Toileting    Toileting assist Assist for toileting: Dependent - Patient 0%     Transfers Chair/bed transfer  Transfers assist     Chair/bed transfer assist level: Minimal Assistance - Patient > 75% Chair/bed transfer assistive device: Programmer, multimedia  Ambulation assist   Ambulation activity did not occur: Safety/medical concerns  Assist level: Moderate Assistance - Patient 50 - 74% Assistive device: Walker-rolling Max distance: 3ft   Walk 10 feet activity   Assist  Walk 10 feet activity did not occur: Safety/medical concerns        Walk 50 feet activity   Assist Walk 50 feet with 2 turns activity did not occur: Safety/medical concerns         Walk 150 feet activity   Assist Walk 150 feet activity did not occur: Safety/medical concerns         Walk 10 feet on uneven surface  activity   Assist Walk 10 feet on uneven surfaces activity did not occur: Safety/medical concerns         Wheelchair     Assist Is the patient using a wheelchair?: Yes Type of Wheelchair: Manual    Wheelchair assist level: Dependent - Patient 0%      Wheelchair 50 feet with 2 turns  activity    Assist        Assist Level: Dependent - Patient 0%   Wheelchair 150 feet activity     Assist      Assist Level: Dependent - Patient 0%   Blood pressure 110/75, pulse 86, temperature 97.7 F (36.5 C), temperature source Oral, resp. rate 18, height 5\' 2"  (1.575 m), weight 96.3 kg, SpO2 100 %.  Medical Problem List and Plan: 1. 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  Antithrombotics: -DVT/anticoagulation:  Pharmaceutical: Lovenox             -antiplatelet therapy: Plavix.  3. Pain Management/spasticity: Tylenol prn.              -ROM with therapy             -consider botox, defer to primary team  1/28=continue Voltaren gel for L knee QID  2/6- Pain controlled- con't regimen 4. Mood: LCSW to follow for evaluation and support.              -antipsychotic agents: N/A 5. Neuropsych: This patient may be capable of making decisions on her own behalf.             -will need to investigate further with family input.  6. Skin/Wound Care:  Routine pressure relief measures.  7. Fluids/Electrolytes/Nutrition: Monitor I/O. Check CMET in am. 8. T2DM: Hgb A1c-9.0 poorly controlled. Has been on insulin for the past year.              --came in with DKA -->on Glucerna TID BETWEEN meals. Will change to lower carb Ensure max with juven for low protein stores.              --Continue Insulin gargline with 25 units at night and 3 units TID with meals  1/24- will monitor for trend- is elevated- and change as of tomorrow  1/26- CBGs 98 to 249- pt asking for juices, sweets all the time per team - they are having to limit sugar intake. Will increase meal coverage to 4 units TID  2/2- CBGs doing much better with increase in  Semglee- con't regimen-  nursing having to stop pt from drinking juices all the time.   2/3- CBGs keeps going up - likely snacks/juices- 86-200- will con't regimen for now  2/6- CBG's doing much better- 81-184- but only 1 episode >120- con't regimen 9.  Hyperthyroid: Recent diagnosis and now on Tapazole             --continue to monitor HR TID.  10. Gastroparesis?: Changed reglan to po route.             --monitor for TDK  1/26- will remove PICC since taking Reglan PO- LBM this AM- is going. 1/31- gastroparesis better and stable on Reglan PO- although pt likes better IV.   11. H/o anxiety/depression: Continue Lexapro with ativan at bedtime.             --melatonin for sleep wake disruption.  12. Hyponatremia: Monitor Sodium level with serial checks as trending down.              --1/25- Up to 136 - will monitor  2/2- Na 139- con't to monitor  13. HTN: SBP goal 130-150 due to severe intracranial atherosclerosis.  --Monitor BP TID. Continue Zestril.   1/28 bp controlled 14. Constipation 2/2- LBM yesterday- con't to monitor 2/6- LBM this AM  15. Dispo  2/2- will write disability letter 29. urinary retention  2/1- cannot void- U/A pending- will treat as soon as know has UTI-   2/2- gave 1 dose of ABX yesterday but U/A (-)- started flomax yesterday- explained will take a few days and hopefully help- if continues, will need Urology outpt f/u- explained to pt that is an outpt w/u.   2/3- will recheck U/A and Cx- even though last one was (-), I don't have a reason she just stopped voiding- and Cx showed multiple species- will recollect.   2/4: UA appears negative, f/u UC  2/6- Urine Cx was (-) this time- per chart, is voiding now? Will double check with nursing 17. Left hand pain: placed order to ice.     LOS: 14 days A FACE TO FACE EVALUATION WAS PERFORMED  Lori [name] 03/12/2021, 7:10 PM

## 2021-03-13 LAB — GLUCOSE, CAPILLARY
Glucose-Capillary: 107 mg/dL — ABNORMAL HIGH (ref 70–99)
Glucose-Capillary: 127 mg/dL — ABNORMAL HIGH (ref 70–99)
Glucose-Capillary: 110 mg/dL — ABNORMAL HIGH (ref 70–99)
Glucose-Capillary: 238 mg/dL — ABNORMAL HIGH (ref 70–99)

## 2021-03-13 NOTE — Patient Care Conference (Signed)
Inpatient RehabilitationTeam Conference and Plan of Care Update Date: 03/13/2021   Time: 11:43 AM    Patient Name: Lori [name] [facility]      Medical Record Number: [id number]  Date of Birth: 04/23/1970 Sex: Female         Room/Bed: 4W06C/4W06C-01 Payor Info: Payor: MEDICARE / Plan: MEDICARE PART A / Product Type: *No Product type* /    Admit Date/Time:  02/26/2021  2:07 PM  Primary Diagnosis:  [facility] Problems: Principal Problem:   Debility Active Problems:   History of cerebral infarction    Expected Discharge Date: Expected Discharge Date: 03/16/21  Team Members Present: Physician leading conference: Dr. Courtney Heys Social Worker Present: Ovidio Kin, LCSW Nurse Present: Dorthula Nettles, RN PT Present: Ailene Rud, PT OT Present: Lillia Corporal, OT SLP Present: Charolett Bumpers, SLP PPS Coordinator present : Gunnar Fusi, SLP     Current Status/Progress Goal Weekly Team Focus  Bowel/Bladder   incotinent to bowel and bladde, LBM 02/06         Swallow/Nutrition/ Hydration   reg diet, thin liquids  sup A  reg/thin tolerance w/ safe swallowing   ADL's   UB Mod/Max, LB Mod/Max, stand pivot CGA/Min sits prematurely, self limiting  Min/Mod  ADL retraining, family ed, endurance, general conditioning, standing balance   Mobility   min mod bed mobility, min sts and spt, gait up to 100 ft min  min A overall  transfers and gait   Communication             Safety/Cognition/ Behavioral Observations  sup A-to-min A; likely at cognitive baseline - recommend assist with medication management at discharge due to decreased error awareness  supervision  problem solving, medication management, alternating attention   Pain   Dneies pain, volteren gel to left hand and left knee with relief         Skin   MASD under bilateral breast and groin, nystatin powder applied           Discharge Planning:  Husband has ented a home for he pt and 52 yo daughter. Husband set up to  come in tomorrow for education aware pt will require 24/7 care at DC. Unsure how realsitic all are regarding pt's care.   Team Discussion: Medically stable, NO juices please! PVR to ensure not retaining. Incontinent/continent B/B. Reports headache/wrist pain. MASD to abdominal folds, using Nystatin powder. Husband coming in for family education. Patient tends to sit prematurely. Will need a WC. Recommending a ramp installation.  Patient on target to meet rehab goals: Declining some things, doing the bare minimum. Not safe. Mod/max assist. Can be contact guard to min assist. Very self-limiting.  *See Care Plan and progress notes for long and short-term goals.   Revisions to Treatment Plan:  Adjusting medications   Teaching Needs: Family education, diabetes management, medication/pain medication. Safety awareness, skin/wound care, transfer/gait training, etc.  Current Barriers to Discharge: Decreased caregiver support, Incontinence, Wound care, Medication compliance, and Behavior  Possible Resolutions to Barriers: Family education Follow-up PT/OT Order recommended DME Ramp installed     Medical Summary Current Status: continent/incontinent- not clear in documentaiton if needing caths-HA and wrist pain; nystatin for MASD- likes to drink juices  Barriers to Discharge: Behavior;Decreased family/caregiver support;Home enviroment access/layout;Incontinence;Neurogenic Bowel & Bladder;Weight;Wound care;Other (comments)  Barriers to Discharge Comments: MASD- getting nystatin cream for this- husband did some family traiing- declining a lot of therapy- Possible Resolutions to [employer] Focus: needs bladder scans to make sure emptying- declining  therapy- wants yoga/tai chi instead- very self limiting- downgraded goals again- hopeful to get to mod A- but she sits prematurily- Supervison-min A- at baseline- needs alarms and med box- d/c 2/10- will have single step at new place- can get  ramp   Continued Need for Acute Rehabilitation Level of Care: The patient requires daily medical management by a physician with specialized training in physical medicine and rehabilitation for the following reasons: Direction of a multidisciplinary physical rehabilitation program to maximize functional independence : Yes Medical management of patient stability for increased activity during participation in an intensive rehabilitation regime.: Yes Analysis of laboratory values and/or radiology reports with any subsequent need for medication adjustment and/or medical intervention. : Yes   I attest that I was present, lead the team conference, and concur with the assessment and plan of the team.   Cristi Loron 03/13/2021, 4:11 PM

## 2021-03-13 NOTE — Progress Notes (Signed)
Speech Language Pathology Daily Session Note  Patient Details  Name: Lori [name] MRN: [id number] Date of Birth: July 30, 1970  Today's Date: 03/13/2021 SLP Individual Time: 1446-1530 SLP Individual Time Calculation (min): 44 min  Short Term Goals: Week 2: SLP Short Term Goal 1 (Week 2): STG=LTG due to ELOS  Skilled Therapeutic Interventions:Skilled ST services focused on cognitive skills. SLP facilitated anticipatory awareness skills given functional scenarios at home. Pt was able to recall transfer steps and provided functional solutions to verbal problems with supervision A verbal cues. However pt demonstrated reduce insight into the level of assistance needed to complete these tasks. Pt supports being able to transfer from [facility] to walker and then to bed without assistance needed. SLP facilitated pt demonstrating this transfer, pt was able to direct care and maneuver her WC with her feet at a slow rate. Pt required verbal cues for WC placement to bed, NT had to hold WC in place even when brakes were locked chair moved. Pt required mod A to go from sit to stand and then sat prematurely on bed, almost falling off at the edge of bed. Pt then required mod A to stand up again to sit further back, this time she was able to sit safely in bed. Pt required min A to swing legs into bed. SLP provided education need for assistance during transfers, pt agreed but continued to demonstrate poor insight stating she will be able to change her briefs alone at home. SLP is concerned with pt's safety if family is not able to provide 24 hour supervision and especially needs supervision with ADLS and transferring. Pt was left in room with call bell within reach and bed alarm set. SLP recommends to continue skilled services.     Pain Pain Assessment Pain Score: 0-No pain  Therapy/Group: Individual Therapy  Jabril Pursell  [facility] 03/13/2021, 4:17 PM

## 2021-03-13 NOTE — Progress Notes (Signed)
PROGRESS NOTE   Subjective/Complaints: LBM yesterday- bowels OK-  Needs Eyeglasses. Handed to her- she couldn't find them on bedside table.   Also, denies any issues.     ROS: limited by cognition Objective:   No results found. Recent Labs    03/12/21 0603  WBC 6.8  HGB 11.2*  HCT 34.5*  PLT 355    Recent Labs    03/12/21 0603  NA 138  K 4.7  CL 109  CO2 20*  GLUCOSE 119*  BUN 6  CREATININE 0.77  CALCIUM 8.8*      Intake/Output Summary (Last 24 hours) at 03/13/2021 1030 Last data filed at 03/12/2021 1900 Gross per 24 hour  Intake 240 ml  Output --  Net 240 ml         Physical Exam: Vital Signs Blood pressure 125/68, pulse 74, temperature (!) 97.5 F (36.4 C), temperature source Oral, resp. rate 18, height 5\' 2"  (1.575 m), weight 96.3 kg, SpO2 100 %.    General: awake, alert, appropriate, laying in bed; sleepy; NAD HENT: dysconjugate gaze; oropharynx moist CV: regular rate; no JVD Pulmonary: CTA B/L; no W/R/R- good air movement GI: soft, NT, ND, (+)BS Psychiatric: appropriate- less interactive- but can speak appropriately Neurological: vague- not speaking much again this AM- nodding head yes or no Musculoskeletal: has hyperextension of multiple fingers on L hand at rest- no change    Cervical back: Normal range of motion and neck supple.  Skin:    General: Skin is warm and dry.  Neurological:     Mental Status: She is alert but slow to process.  Follows simple ommands.     Comments: Dysconjugate gaze.     RUE and RLE grossly 4- to 4/5. LUE 3/5 at shoulder,elbow but limited d/t flexor contracture and hypertonicity at left wrist/fingers   RLE 4/5. LLE 3 to 3+/5.  Hyperreflexic on left.  Also with mild inattention to left.   Assessment/Plan: 1. Functional deficits which require 3+ hours per day of interdisciplinary therapy in a comprehensive inpatient rehab setting. Physiatrist is providing  close team supervision and 24 hour management of active medical problems listed below. Physiatrist and rehab team continue to assess barriers to discharge/monitor patient progress toward functional and medical goals  Care Tool:  Bathing    Body parts bathed by patient: Left arm, Chest, Abdomen, Right upper leg, Left upper leg, Front perineal area, Face, Buttocks   Body parts bathed by helper: Buttocks, Right arm, Right lower leg, Left lower leg     Bathing assist Assist Level: Maximal Assistance - Patient 24 - 49%     Upper Body Dressing/Undressing Upper body dressing   What is the patient wearing?: Pull over shirt    Upper body assist Assist Level: Maximal Assistance - Patient 25 - 49%    Lower Body Dressing/Undressing Lower body dressing      What is the patient wearing?: Incontinence brief, Pants     Lower body assist Assist for lower body dressing: Total Assistance - Patient < 25%     Toileting Toileting    Toileting assist Assist for toileting: Dependent - Patient 0%     Transfers Chair/bed transfer  Transfers assist     Chair/bed transfer assist level: Minimal Assistance - Patient > 75% Chair/bed transfer assistive device: Programmer, multimedia   Ambulation assist   Ambulation activity did not occur: Safety/medical concerns  Assist level: Moderate Assistance - Patient 50 - 74% Assistive device: Walker-rolling Max distance: 84ft   Walk 10 feet activity   Assist  Walk 10 feet activity did not occur: Safety/medical concerns        Walk 50 feet activity   Assist Walk 50 feet with 2 turns activity did not occur: Safety/medical concerns         Walk 150 feet activity   Assist Walk 150 feet activity did not occur: Safety/medical concerns         Walk 10 feet on uneven surface  activity   Assist Walk 10 feet on uneven surfaces activity did not occur: Safety/medical concerns         Wheelchair     Assist Is the  patient using a wheelchair?: Yes Type of Wheelchair: Manual    Wheelchair assist level: Dependent - Patient 0%      Wheelchair 50 feet with 2 turns activity    Assist        Assist Level: Dependent - Patient 0%   Wheelchair 150 feet activity     Assist      Assist Level: Dependent - Patient 0%   Blood pressure 125/68, pulse 74, temperature (!) 97.5 F (36.4 C), temperature source Oral, resp. rate 18, height 5\' 2"  (1.575 m), weight 96.3 kg, SpO2 100 %.  Medical Problem List and Plan: 1. 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  2.  Antithrombotics: -DVT/anticoagulation:  Pharmaceutical: Lovenox             -antiplatelet therapy: Plavix.  3. Pain Management/spasticity: Tylenol prn.              -ROM with therapy             -consider botox, defer to primary team  2/7- pain controlled- con't regimen 4. Mood: LCSW to follow for evaluation and support.              -antipsychotic agents: N/A 5. Neuropsych: This patient may be capable of making decisions on her own behalf.             -will need to investigate further with family input.  6. Skin/Wound Care:  Routine pressure relief measures.  7. Fluids/Electrolytes/Nutrition: Monitor I/O. Check CMET in am. 8. T2DM: Hgb A1c-9.0 poorly controlled. Has been on insulin for the past year.              --came in with DKA -->on Glucerna TID BETWEEN meals. Will change to lower carb Ensure max with juven for low protein stores.              --Continue Insulin gargline with 25 units at night and 3 units TID with meals  1/24- will monitor for trend- is elevated- and change as of tomorrow  1/26- CBGs 98 to 249- pt asking for juices, sweets all the time per  team  - they are having to limit sugar intake. Will increase meal coverage to 4 units TID  2/2- CBGs doing much better with increase in Semglee- con't regimen- nursing having to stop pt from drinking juices all the time.   2/3- CBGs keeps going up - likely snacks/juices- 86-200- will con't regimen for now  2/6- CBG's doing much better- 81-184- but only 1 episode >120- con't regimen  2/7- CBGs 81-184- 1 >130- doing great 9.  Hyperthyroid: Recent diagnosis and now on Tapazole             --continue to monitor HR TID.  10. Gastroparesis?: Changed reglan to po route.             --monitor for TDK  1/26- will remove PICC since taking Reglan PO- LBM this AM- is going. 1/31- gastroparesis better and stable on Reglan PO- although pt likes better IV.   11. H/o anxiety/depression: Continue Lexapro with ativan at bedtime.             --melatonin for sleep wake disruption.  12. Hyponatremia: Monitor Sodium level with serial checks as trending down.              --1/25- Up to 136 - will monitor  2/2- Na 139- con't to monitor  13. HTN: SBP goal 130-150 due to severe intracranial atherosclerosis.  --Monitor BP TID. Continue Zestril.   1/28 bp controlled 14. Constipation 2/2- LBM yesterday- con't to monitor 2/6- LBM this AM  15. Dispo  2/2- will write disability letter 72. urinary retention  2/1- cannot void- U/A pending- will treat as soon as know has UTI-   2/2- gave 1 dose of ABX yesterday but U/A (-)- started flomax yesterday- explained will take a few days and hopefully help- if continues, will need Urology outpt f/u- explained to pt that is an outpt w/u.   2/3- will recheck U/A and Cx- even though last one was (-), I don't have a reason she just stopped voiding- and Cx showed multiple species- will recollect.   2/4: UA appears negative, f/u UC  2/6- Urine Cx was (-) this time- per chart, is voiding now? Will double check with nursing 17. Left hand pain: placed order to ice.     LOS: 15 days A  FACE TO FACE EVALUATION WAS PERFORMED  Lori [name] 03/13/2021, 10:30 AM

## 2021-03-13 NOTE — Progress Notes (Signed)
Speech Language Pathology Discharge Summary  Patient Details  Name: Lori [name] MRN: [id number] Date of Birth: 03/06/1970  Today's Date: 03/15/2021 SLP Individual Time: 1388-7195 SLP Individual Time Calculation (min): 30 min   Skilled Therapeutic Interventions:  Pt was seen sitting up in room with husband in attendance for review of edu regarding memory and speech prior to discharge. SLP reviewed external and internal memory strategies and provided pt with handout. Pt was able to recall 1 external strategy prior to SLP providing list of memory strategies. Pt became upset while therapist in room because husband reported he does not believe the "house" will be ready for her to discharge tomorrow. SLP told pt to speak with doctor/nurse/case manager about this problem as soon as possible. SLP was left in bed (at pt request), bed alarm activated and immediate needs within reach.   Patient has met 5 of 5 long term goals.  Patient to discharge at overall Supervision level.  Reasons goals not met: All goals met   Clinical Impression/Discharge Summary: Patient has made functional gains and has met 5 of 5 long-term goals this admission due to improved cognitive-linguistic and swallow function. Patient currently requires supervision assist for higher level cognitive tasks in regards to functional recall, complex problem solving, error and anticipatory awareness. Patient feels she is near baseline level of function from a cognitive perspective. Patient is currently tolerating a regular texture diet and thin liquids and modified independent for implementation of safe swallow precautions and strategies. Patient and family education is complete and patient to discharge at overall supervision level. Patient's care partner is independent to provide the necessary physical and cognitive assistance at discharge. Patient would benefit from continued SLP services in home health setting to maximize cognitive functions,  higher level problem solving, and functional independence with iADLs.  Care Partner:  Caregiver Able to Provide Assistance: Yes  Type of Caregiver Assistance: Cognitive;Physical  Recommendation:  Home Health SLP;24 hour supervision/assistance  Rationale for SLP Follow Up: Maximize cognitive function and independence;Reduce caregiver burden   Equipment: None   Reasons for discharge: Discharged from hospital;Treatment goals met   Patient/Family Agrees with Progress Made and Goals Achieved: Yes    Verdene Lennert MS, CCC-SLP, CBIS  03/15/2021, 11:37 AM

## 2021-03-13 NOTE — Progress Notes (Signed)
Physical Therapy Session Note  Patient Details  Name: Lori [name] MRN: [id number] Date of Birth: [birth year]  Today's Date: 03/13/2021 PT Individual Time: 0915-1014 PT Individual Time Calculation (min): 59 min   Short Term Goals: Week 1:  PT Short Term Goal 1 (Week 1): Pt will transfer with mod A or better with LRAD PT Short Term Goal 1 - Progress (Week 1): Met PT Short Term Goal 2 (Week 1): Pt will initiate gait training PT Short Term Goal 2 - Progress (Week 1): Met PT Short Term Goal 3 (Week 1): Pt will initiate w/c propulsion PT Short Term Goal 3 - Progress (Week 1): Met Week 2:  PT Short Term Goal 1 (Week 2): =LTGs d/t ELOS  Skilled Therapeutic Interventions/Progress Updates:    Pt recd in bed with her husband, Ilias, assisting with changing her shirt. They completed this task with supervision. Session focused on unscheduled family education. Educated on bed mobility, with pt performing task min A with incr time and bed features. Donned pants with max A for time. All transfers with CGA-min A with education on w/c and RW appropriate use and safety. Pt transported to therapy gym for time management and energy conservation. Attempted stairs as pt will have stairs at new home. First attempt, pt was unable to manage stair with RLE. Pt was able to complete 1 6" stair x 2 with mod A for balance and max verbal encouragement. Pt then performed car transfer with min A, max A to prevent premature sitting. Educated pt's husband on transfer and w/c management/leg rests. Transported back to room for time. Provided home measurement sheet and discussed possibility of ramp or bumping up stairs for safety. Pt remained in chair and was left with all needs in reach and alarm active.    Therapy Documentation Precautions:  Precautions Precautions: Fall Precaution Comments: L hemi (UE>LE), PICC R UE Restrictions Weight Bearing Restrictions: No General:      Therapy/Group: Individual  Therapy  Mickel Fuchs 03/13/2021, 10:13 AM

## 2021-03-13 NOTE — Progress Notes (Signed)
Occupational Therapy Session Note  Patient Details  Name: Lori [name] MRN: [id number] Date of Birth: 11/21/1970  Today's Date: 03/13/2021 OT Individual Time: 1300-1408 OT Individual Time Calculation (min): 68 min    Short Term Goals: Week 1:  OT Short Term Goal 1 (Week 1): Pt will transfer to BSC/toilet with 1 assist and LRAD OT Short Term Goal 1 - Progress (Week 1): Met OT Short Term Goal 2 (Week 1): Pt will don shirt with MOD A with hemitechnique OT Short Term Goal 2 - Progress (Week 1): Progressing toward goal OT Short Term Goal 3 (Week 1): Pt will don pants over hips in standing with MOD A OT Short Term Goal 3 - Progress (Week 1): Progressing toward goal Week 2:  OT Short Term Goal 1 (Week 2): STG = LTG 2/2 LOS at Min/Mod   Skilled Therapeutic Interventions/Progress Updates:    Pt greeted at time of session semireclined bed level resting agreeable to OT session, no pain but fatigued. After nursing administering meds, pt performing bed mob supine > sit Mod A and encouragement to participate. Stand pivot bed > wheelchair Min/CGA with RW, performing all transfers in this manner throughout session. Pt requesting to wash hair and upon retrieving items and setting up at sink, pt stating she needed to use the bathroom. Stand pivot wheelchair <> BSC over toilet same manner and Max A for clothing management, unable to void despite extended time provided to do so and propping feet on trashcan, etc. When pivoting back to wheelchair, max cues for pt to bring LLE back under self, resulting in plopping into chair unsafely, limited carryover noted from previous sessions. Set up at sink for washing hair, OT assisting with washing and pt able to groom and brush hair with RUE. Supervision for oral hygiene. Discussion with pt regarding DC home, assist needed from husband and planning for upcoming family ed. Pt up in chair for next session alarm on call bell in reach.  Therapy Documentation Precautions:   Precautions Precautions: Fall Precaution Comments: L hemi (UE>LE), PICC R UE Restrictions Weight Bearing Restrictions: No    Therapy/Group: Individual Therapy  [name] 03/13/2021, 7:57 AM

## 2021-03-13 NOTE — Progress Notes (Signed)
Physical Therapy Session Note ° °Patient Details  °Name: Lori [name] °MRN: [id number] °Date of Birth: 11/10/1970 ° °Today's Date: 03/13/2021 °PT Individual Time: 1130-1211 °PT Individual Time Calculation (min): 41 min  ° °Short Term Goals: °Week 1:  PT Short Term Goal 1 (Week 1): Pt will transfer with mod A or better with LRAD °PT Short Term Goal 1 - Progress (Week 1): Met °PT Short Term Goal 2 (Week 1): Pt will initiate gait training °PT Short Term Goal 2 - Progress (Week 1): Met °PT Short Term Goal 3 (Week 1): Pt will initiate w/c propulsion °PT Short Term Goal 3 - Progress (Week 1): Met °Week 2:  PT Short Term Goal 1 (Week 2): =LTGs d/t ELOS ° °Skilled Therapeutic Interventions/Progress Updates:  °Patient seated upright in w/c on entrance to room. Patient alert and agreeable to PT session but questions why this therapist is here and not her regular therapist. Education provided re: therapy department schedule and pt's need to meet at least 3 hours of therapy each day in order to remain compliant with her insurance's rules re: reimbursement.  ° °NT in room and pt relates that her brief needs to be changed. STEDY used for pt to stand, MaxA for doffing pants, pericare, changing brief and donning pants.  ° °Patient with no pain complaint throughout session. ° °Therapeutic Activity: °Bed Mobility: Patient performed sit-->supine with Min/ModA for BLE. VC/ tc required for effort. Pt able to perform bridging to center self in bed.  °Transfers: Patient performed sit<>stand transfers throughout session with CGA. Performs slowly but completely to upright stance with vc. Stand pivot at end of session with CGA and continued cues for technique and controlling descent.  ° °Guided in minisquats x10 with pt able to increase flexion performed from start to finish.  ° °5" step brought to pt and she is able to perform step up onto step using RW x4. She requires vc for technique, sequencing first, then performs well.In first two,  pt quickly desends and sits to w/c d/t fear. Education with pt re: need for slow, controlled descent in order to improve safety and decrease risk for injury/ falls. Plus pt demonstrates strength/ ability to perform slowly and with control. Pt seems surprised. Guided pt in vc for slow return from step to floor standing upright with deep breath, then reaching back for w/c prior to slow descent to sit. Able to follow instructions and perform twice to her surprise.  ° °Provided vc throughout for slow controlled movements to improve strength and improve safety.  ° °Patient supine  in bed at end of session with brakes locked, bed alarm set, and all needs within reach. Tray table in front of pt and ready for lunch tray. °   ° °Therapy Documentation °Precautions:  °Precautions °Precautions: Fall °Precaution Comments: L hemi (UE>LE), PICC R UE °Restrictions °Weight Bearing Restrictions: No °General: °  °Pain: °Pain Assessment °Pain Scale: 0-10 °Pain Score: 0-No pain ° °Therapy/Group: Individual Therapy ° °Julie A Kraus PT, DPT °03/13/2021, 10:10 AM  °

## 2021-03-14 LAB — GLUCOSE, CAPILLARY
Glucose-Capillary: 107 mg/dL — ABNORMAL HIGH (ref 70–99)
Glucose-Capillary: 74 mg/dL (ref 70–99)
Glucose-Capillary: 175 mg/dL — ABNORMAL HIGH (ref 70–99)
Glucose-Capillary: 88 mg/dL (ref 70–99)

## 2021-03-14 MED ORDER — BUTALBITAL-APAP-CAFFEINE 50-325-40 MG PO TABS
1.0000 | ORAL_TABLET | Freq: Four times a day (QID) | ORAL | Status: DC | PRN
  Administered 2021-03-14: 1 via ORAL
  Filled 2021-03-14: qty 1

## 2021-03-14 NOTE — Progress Notes (Signed)
Recreational Therapy Session Note  Patient Details  Name: Lori [name] MRN: [id number] Date of Birth: [birth year] Today's Date: 03/14/2021  Pain: no c/o Skilled Therapeutic Interventions/Progress Updates: Assisted pt back to room after PT session.  Pt c/o fatigue, states she did not sleep much last night & requesting to go to bed.  Pt performed stand pivot transfer using RW with extra time and min assist to the bed.  Pt directing her care, requesting removal of shoes and assist with bed mobility.  Pt requested to lie on her side but directed LRT to use chuck pad to assist her in rolling.  Pt assisted with side lying position, pillow between knees, bed alarm set and all needs within reach.  Pt hopeful she can rest before afternoon therapies.  RN at bedside administering meds.  [place] 03/14/2021, 1:10 PM

## 2021-03-14 NOTE — Progress Notes (Signed)
Patient last documented urine output at 1700, pt checked several times throughout the night for incontinence. Patient dry and states she doesn't have to urinate because she didn't drink much after dinner. Pt bladder scan for 126, pt refused to be cath or get up and go to the restroom.

## 2021-03-14 NOTE — Progress Notes (Signed)
Speech Language Pathology Daily Session Note  Patient Details  Name: Lori [name] MRN: [id number] Date of Birth: 02/19/1970  Today's Date: 03/14/2021 SLP Individual Time: 6415-8309 SLP Individual Time Calculation (min): 25 min  Short Term Goals: Week 2: SLP Short Term Goal 1 (Week 2): STG=LTG due to ELOS  Skilled Therapeutic Interventions: Skilled ST services focused on education. [name] was present for education. SLP provided education pertaining to  deficits in problem solving, short term recall, higher level attention most important reduced awareness of deficits/safety awareness. [name] support pt is at or near cognitive baseline and questions his ability to provided 24 hour supervision A. SLP recommends 24 hour supervision A and emphasized needing assistance for any/all tranfers, pt and pt's husband agreed. SLP provided handout of memory strategies and provided example of TIB pill organizer. All questions answered to satisfaction. Pt was left in room with family and recommend to continue skilled ST services.      Pain Pain Assessment Pain Score: 0-No pain  Therapy/Group: Individual Therapy  Lori [name]  [facility] 03/14/2021, 2:56 PM

## 2021-03-14 NOTE — Progress Notes (Signed)
PROGRESS NOTE   Subjective/Complaints:  Pt reports started having migraines again- last time was 5 years ago, but had the last 2 nights.  Doesn't have this AM.   We discussed will start Fioricet for migraines prn; cannot use Imitrex due to hx of stroke.    ROS: limited by cognition Objective:   No results found. Recent Labs    03/12/21 0603  WBC 6.8  HGB 11.2*  HCT 34.5*  PLT 355    Recent Labs    03/12/21 0603  NA 138  K 4.7  CL 109  CO2 20*  GLUCOSE 119*  BUN 6  CREATININE 0.77  CALCIUM 8.8*      Intake/Output Summary (Last 24 hours) at 03/14/2021 0836 Last data filed at 03/13/2021 1903 Gross per 24 hour  Intake 120 ml  Output --  Net 120 ml         Physical Exam: Vital Signs Blood pressure 126/85, pulse 71, temperature 97.8 F (36.6 C), temperature source Oral, resp. rate 20, height 5\' 2"  (1.575 m), weight 96.3 kg, SpO2 100 %.     General: awake, alert, appropriate, sucking tongue again this AM- is chronic; laying in bed supine; NAD HENT: dysconjugate gaze; oropharynx moist CV: regular rate; no JVD Pulmonary: CTA B/L; no W/R/R- good air movement GI: soft, NT, ND, (+)BS Psychiatric: appropriate; flat Neurological:alert- more interactive today Musculoskeletal: has hyperextension of multiple fingers on L hand at rest- no change    Cervical back: Normal range of motion and neck supple.  Skin:    General: Skin is warm and dry.  Neurological:     Mental Status: She is alert but slow to process.  Follows simple ommands.     Comments: Dysconjugate gaze.     RUE and RLE grossly 4- to 4/5. LUE 3/5 at shoulder,elbow but limited d/t flexor contracture and hypertonicity at left wrist/fingers   RLE 4/5. LLE 3 to 3+/5.  Hyperreflexic on left.  Also with mild inattention to left.   Assessment/Plan: 1. Functional deficits which require 3+ hours per day of interdisciplinary therapy in a comprehensive  inpatient rehab setting. Physiatrist is providing close team supervision and 24 hour management of active medical problems listed below. Physiatrist and rehab team continue to assess barriers to discharge/monitor patient progress toward functional and medical goals  Care Tool:  Bathing    Body parts bathed by patient: Left arm, Chest, Abdomen, Right upper leg, Left upper leg, Front perineal area, Face, Buttocks   Body parts bathed by helper: Buttocks, Right arm, Right lower leg, Left lower leg     Bathing assist Assist Level: Maximal Assistance - Patient 24 - 49%     Upper Body Dressing/Undressing Upper body dressing   What is the patient wearing?: Pull over shirt    Upper body assist Assist Level: Maximal Assistance - Patient 25 - 49%    Lower Body Dressing/Undressing Lower body dressing      What is the patient wearing?: Incontinence brief, Pants     Lower body assist Assist for lower body dressing: Total Assistance - Patient < 25%     Toileting Toileting    Toileting assist Assist for toileting:  Dependent - Patient 0%     Transfers Chair/bed transfer  Transfers assist     Chair/bed transfer assist level: Contact Guard/Touching assist Chair/bed transfer assistive device: Walker   Locomotion Ambulation   Ambulation assist   Ambulation activity did not occur: Safety/medical concerns  Assist level: Moderate Assistance - Patient 50 - 74% Assistive device: Walker-rolling Max distance: 52ft   Walk 10 feet activity   Assist  Walk 10 feet activity did not occur: Safety/medical concerns        Walk 50 feet activity   Assist Walk 50 feet with 2 turns activity did not occur: Safety/medical concerns         Walk 150 feet activity   Assist Walk 150 feet activity did not occur: Safety/medical concerns         Walk 10 feet on uneven surface  activity   Assist Walk 10 feet on uneven surfaces activity did not occur: Safety/medical  concerns         Wheelchair     Assist Is the patient using a wheelchair?: Yes Type of Wheelchair: Manual    Wheelchair assist level: Dependent - Patient 0%      Wheelchair 50 feet with 2 turns activity    Assist        Assist Level: Dependent - Patient 0%   Wheelchair 150 feet activity     Assist      Assist Level: Dependent - Patient 0%   Blood pressure 126/85, pulse 71, temperature 97.8 F (36.6 C), temperature source Oral, resp. rate 20, height 5\' 2"  (1.575 m), weight 96.3 kg, SpO2 100 %.  Medical Problem List and Plan: 1. 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  2.  Antithrombotics: -DVT/anticoagulation:  Pharmaceutical: Lovenox             -antiplatelet therapy: Plavix.  3. Pain Management/spasticity: Tylenol prn.              -ROM with therapy             -consider botox, defer to primary team  2/7- pain controlled- con't regimen  2/8- will start Fioricet prn since cannot use Triptans in pt with stroke- started having migraines again.  4. Mood: LCSW to follow for evaluation and support.              -antipsychotic agents: N/A 5. Neuropsych: This patient may be capable of making decisions on her own behalf.             -will need to investigate further with family input.  6. Skin/Wound Care:  Routine pressure relief measures.  7. Fluids/Electrolytes/Nutrition: Monitor I/O. Check CMET in am. 8. T2DM: Hgb A1c-9.0 poorly controlled. Has been on insulin for the past year.              --came in with DKA -->on Glucerna TID BETWEEN meals. Will change to lower carb Ensure max with juven for low protein stores.              --Continue Insulin gargline with 25 units at night and 3 units TID with meals  1/24- will monitor  for  trend- is elevated- and change as of tomorrow  1/26- CBGs 98 to 249- pt asking for juices, sweets all the time per team - they are having to limit sugar intake. Will increase meal coverage to 4 units TID  2/2- CBGs doing much better with increase in Semglee- con't regimen- nursing having to stop pt from drinking juices all the time.   2/3- CBGs keeps going up - likely snacks/juices- 86-200- will con't regimen for now  2/8- 1 episode high 238- due to juice per nursing- con't regimen 9.  Hyperthyroid: Recent diagnosis and now on Tapazole             --continue to monitor HR TID.  10. Gastroparesis?: Changed reglan to po route.             --monitor for TDK  1/26- will remove PICC since taking Reglan PO- LBM this AM- is going. 1/31- gastroparesis better and stable on Reglan PO- although pt likes better IV.   11. H/o anxiety/depression: Continue Lexapro with ativan at bedtime.             --melatonin for sleep wake disruption.  12. Hyponatremia: Monitor Sodium level with serial checks as trending down.              --1/25- Up to 136 - will monitor  2/2- Na 139- con't to monitor  13. HTN: SBP goal 130-150 due to severe intracranial atherosclerosis.  --Monitor BP TID. Continue Zestril.   1/28 bp controlled 14. Constipation 2/2- LBM yesterday- con't to monitor 2/6- LBM this AM  15. Dispo  2/2- will write disability letter 68. urinary retention  2/1- cannot void- U/A pending- will treat as soon as know has UTI-   2/2- gave 1 dose of ABX yesterday but U/A (-)- started flomax yesterday- explained will take a few days and hopefully help- if continues, will need Urology outpt f/u- explained to pt that is an outpt w/u.   2/3- will recheck U/A and Cx- even though last one was (-), I don't have a reason she just stopped voiding- and Cx showed multiple species- will recollect.   2/4: UA appears negative, f/u UC  2/6- Urine Cx was (-) this time- per chart, is voiding now? Will double check with  nursing  2/8- voiding in general- <200cc bladder scans after voiding. But sometimes not drinking, so is dry more.  17. Left hand pain: placed order to ice.  18. Migraines- last had 5 years ago- started 2 days ago  2/8- will give Fioricet prn.      LOS: 16 days A FACE TO FACE EVALUATION WAS PERFORMED  Lori [name] 03/14/2021, 8:36 AM

## 2021-03-14 NOTE — Progress Notes (Signed)
Physical Therapy Session Note  Patient Details  Name: Lori [name] MRN: [id number] Date of Birth: Dec 12, 1970  Today's Date: 03/14/2021 PT Individual Time: 0076-2263 PT Individual Time Calculation (min): 33 min  and Today's Date: 03/14/2021 PT Missed Time: 12 Minutes Missed Time Reason: Other (Comment) (previous pt care)  Short Term Goals: Week 1:  PT Short Term Goal 1 (Week 1): Pt will transfer with mod A or better with LRAD PT Short Term Goal 1 - Progress (Week 1): Met PT Short Term Goal 2 (Week 1): Pt will initiate gait training PT Short Term Goal 2 - Progress (Week 1): Met PT Short Term Goal 3 (Week 1): Pt will initiate w/c propulsion PT Short Term Goal 3 - Progress (Week 1): Met Week 2:  PT Short Term Goal 1 (Week 2): =LTGs d/t ELOS  Skilled Therapeutic Interventions/Progress Updates:    Pt seated in w/c on arrival and agreeable to therapy, her husband present for family education. He reports that they have 5 steps at home, not 1 as previously thought. Discussed ramp but d/t slow turn around and d/c Friday, likely will need ambulance transport home prior to ramp installation. Discussed with CSW. Session focused on hands on education with short distance gait and transfers. Multimodal cueing and instruction to stay close to pt and hand placement. Pt performed several transfers from w/c<>mat table ~ 4 ft away. Min A with VC required for safety when sitting with 90% of attempts. Then discussed stairs at length, pt will likely be unsafe with shower chair method so this therapist recommends ramp as soon as available and ambulance transport prior to ramp install. Pt returned to room, reporting intense fatigue which she attributes to flexeril. Pt remained in w/c and was left with all needs in reach and alarm active.   Therapy Documentation Precautions:  Precautions Precautions: Fall Precaution Comments: L hemi (UE>LE), PICC R UE Restrictions Weight Bearing Restrictions: No General: PT  Amount of Missed Time (min): 12 Minutes PT Missed Treatment Reason: Other (Comment) (previous pt care)    Therapy/Group: Individual Therapy  Mickel Fuchs 03/14/2021, 3:57 PM

## 2021-03-14 NOTE — Progress Notes (Signed)
Physical Therapy Session Note  Patient Details  Name: Lori [name] MRN: [id number] Date of Birth: 06/04/1970  Today's Date: 03/14/2021 PT Individual Time: 0935-1030 PT Individual Time Calculation (min): 55 min   Short Term Goals: Week 2:  PT Short Term Goal 1 (Week 2): =LTGs d/t ELOS  Skilled Therapeutic Interventions/Progress Updates: Pt presented in bed sleeping but easily aroused and agreeable to therapy. Pt denies pain at start of session but c/o L wrist pain at end of session with nsg notified. Pt performed supine to sit with minA and increased time with use of bed features. PTA threaded pants and donned shoes total A. As pt prepared to stand PTA noted that current brief split in middle. Pt refused to stand to change brief after exchange therefore pt returned to supine (PTA doffed shoes) and pt performed rolling L/R with CGA and increased time as PTA changed brief. Pt returned to sitting EOB with increased time in same manner as prior. Pt required cues to push up to midline with use of RUE. Once pt returned to EOB performed Sit to stand with minA and pt required modA for pulling pants over hips. Once completed pt performed stand pivot to w/c with CGA. Pt then moved to sink and performed oral hygiene with set up. Pt then requesting to change shirt required modA for doffing and minA with increased time for donning new shirt. Pt transported to rehab gym and participated in ambulation 14ft and 106ft with RW and w/c follow. Pt attempted to impulsively sit with PTA providing max cues to wait until w/c in place. Pt ambulating with decreased gait speed, forward flexed posture and shortened step length. Pt then moved over to stairs in w/c and participated in toe taps to 6in step x 5 with RLE for hip strengthening. Pt required mod cues for maintain TKE in LLE while performing activity with R. Pt transported back to room and handed off to Tool, RT in hallway in front of nsg station while PTA notified nsg of  pt's request for pain meds.      Therapy Documentation Precautions:  Precautions Precautions: Fall Precaution Comments: L hemi (UE>LE), PICC R UE Restrictions Weight Bearing Restrictions: No General: PT Amount of Missed Time (min): 12 Minutes PT Missed Treatment Reason: Other (Comment) (previous pt care) Vital Signs: Therapy Vitals Temp: (!) 97.5 F (36.4 C) Temp Source: Oral Pulse Rate: 89 Resp: 14 BP: 115/66 Patient Position (if appropriate): Lying Oxygen Therapy SpO2: 99 % O2 Device: Room Air Pain: Pain Assessment Pain Scale: 0-10 Pain Score: 6  Pain Type: Chronic pain Pain Location: Head Pain Descriptors / Indicators: Headache Pain Frequency: Intermittent Pain Onset: On-going Pain Intervention(s): Medication (See eMAR) Mobility:   Locomotion :    Trunk/Postural Assessment :    Balance:   Exercises:   Other Treatments:      Therapy/Group: Individual Therapy  Clista Rainford 03/14/2021, 4:25 PM

## 2021-03-14 NOTE — Progress Notes (Signed)
Husband here for family education. Was finished and leaving for the day. Asked if knew how to check CBG and administer insulin, he reported no. He reports that will be here at 0900 tomorrow for education. Discussed with P. Love, will do insulin pen.

## 2021-03-14 NOTE — Progress Notes (Signed)
Patient ID: Lori [name], female   DOB: [birth year], 51 y.o.   MRN: [id number]  Met with pt and husband who is here for family education. Discussed equipment needs and follow up. They plan to get a ramp for the three steps they have at new address. Both are aware of her need for 24/7 care at discharge. Looking for home health agency and awaiting team's recommendations for equipment.

## 2021-03-14 NOTE — Progress Notes (Signed)
Physical Therapy Session Note  Patient Details  Name: Lori [name] MRN: [id number] Date of Birth: [birth year]  Today's Date: 03/14/2021 PT Missed Time: 45 Minutes Missed Time Reason: Patient unwilling to participate;Pain (migraine)  Short Term Goals: Week 3:     Skilled Therapeutic Interventions/Progress Updates:    Patient received supine in bed, reporting a migraine, 10/10 pain, premedicated. Patient refusing to participate in therapy at this time. Agreeable to PT checking back on patient to allow medication time to begin to work. When PT was leaving, patient stated "but if I'm sleeping, don't bother waking me up." PT checking back on patient ~15 mins later and patient was sleeping.   Therapy Documentation Precautions:  Precautions Precautions: Fall Precaution Comments: L hemi (UE>LE), PICC R UE Restrictions Weight Bearing Restrictions: No    Therapy/Group: Individual Therapy  Karoline Caldwell, PT, DPT, CBIS  03/14/2021, 7:53 AM

## 2021-03-14 NOTE — Progress Notes (Addendum)
Made printouts of how to use insulin pens and how to use check blood sugars. Husband needs to be educated on doing this prior to discharge. Placed in education bin.

## 2021-03-14 NOTE — Progress Notes (Signed)
Occupational Therapy Session Note  Patient Details  Name: Lori [name] MRN: [id number] Date of Birth: 20-Dec-1970  Today's Date: 03/14/2021 OT Individual Time: 1300-1343 OT Individual Time Calculation (min): 43 min    Short Term Goals: Week 1:  OT Short Term Goal 1 (Week 1): Pt will transfer to BSC/toilet with 1 assist and LRAD OT Short Term Goal 1 - Progress (Week 1): Met OT Short Term Goal 2 (Week 1): Pt will don shirt with MOD A with hemitechnique OT Short Term Goal 2 - Progress (Week 1): Progressing toward goal OT Short Term Goal 3 (Week 1): Pt will don pants over hips in standing with MOD A OT Short Term Goal 3 - Progress (Week 1): Progressing toward goal Week 2:  OT Short Term Goal 1 (Week 2): STG = LTG 2/2 LOS at Min/Mod   Skilled Therapeutic Interventions/Progress Updates:    Pt greeted at time of session semireclined in bed resting agreeable to OT session, stating husband went out to car and would be coming back soon for family ed. While husband not present, printed off hand out for RW as pt states she does not have one at home and recommended via SW to purchase off [place]. Husband returned and relayed this to him as well. Focus of session on bed mobility, stand pivot transfers bed > wheelchair <> Bsc over toilet with husband assisting throughout all. Instruction and training provided on hand placement, RW and wheelchair management, and how to cue the pt to fully turn and prevent premature sitting. Husband assisting with clothing management and verbally reviewed bathing/dressing tasks with spouse. Pt up in wheelchair with call bell in reach and in prep for PT family ed.    Therapy Documentation Precautions:  Precautions Precautions: Fall Precaution Comments: L hemi (UE>LE), PICC R UE Restrictions Weight Bearing Restrictions: No    Therapy/Group: Individual Therapy  [name] 03/14/2021, 7:26 AM

## 2021-03-15 LAB — GLUCOSE, CAPILLARY
Glucose-Capillary: 112 mg/dL — ABNORMAL HIGH (ref 70–99)
Glucose-Capillary: 196 mg/dL — ABNORMAL HIGH (ref 70–99)
Glucose-Capillary: 67 mg/dL — ABNORMAL LOW (ref 70–99)
Glucose-Capillary: 69 mg/dL — ABNORMAL LOW (ref 70–99)
Glucose-Capillary: 142 mg/dL — ABNORMAL HIGH (ref 70–99)
Glucose-Capillary: 208 mg/dL — ABNORMAL HIGH (ref 70–99)

## 2021-03-15 MED ORDER — AMLODIPINE BESYLATE 5 MG PO TABS
5.0000 mg | ORAL_TABLET | Freq: Every day | ORAL | 0 refills | Status: DC
  Filled 2021-03-15: qty 30, 30d supply, fill #0

## 2021-03-15 MED ORDER — MELATONIN 3 MG PO TABS
9.0000 mg | ORAL_TABLET | Freq: Every day | ORAL | 0 refills | Status: DC
  Filled 2021-03-15: qty 90, 30d supply, fill #0

## 2021-03-15 MED ORDER — LISINOPRIL 20 MG PO TABS
20.0000 mg | ORAL_TABLET | Freq: Every day | ORAL | 0 refills | Status: DC
  Filled 2021-03-15: qty 30, 30d supply, fill #0

## 2021-03-15 MED ORDER — METHIMAZOLE 5 MG PO TABS
5.0000 mg | ORAL_TABLET | Freq: Every day | ORAL | 0 refills | Status: DC
  Filled 2021-03-15: qty 30, 30d supply, fill #0

## 2021-03-15 MED ORDER — ESCITALOPRAM OXALATE 20 MG PO TABS
20.0000 mg | ORAL_TABLET | Freq: Every day | ORAL | 0 refills | Status: DC
  Filled 2021-03-15: qty 30, 30d supply, fill #0

## 2021-03-15 MED ORDER — INSULIN GLARGINE 100 UNIT/ML SOLOSTAR PEN
29.0000 [IU] | PEN_INJECTOR | Freq: Every day | SUBCUTANEOUS | 0 refills | Status: DC
  Filled 2021-03-15: qty 15, 51d supply, fill #0

## 2021-03-15 MED ORDER — INSULIN ASPART 100 UNIT/ML FLEXPEN
4.0000 [IU] | PEN_INJECTOR | Freq: Three times a day (TID) | SUBCUTANEOUS | 0 refills | Status: DC
  Filled 2021-03-15: qty 3, 25d supply, fill #0

## 2021-03-15 MED ORDER — METOCLOPRAMIDE HCL 10 MG PO TABS
10.0000 mg | ORAL_TABLET | Freq: Three times a day (TID) | ORAL | 0 refills | Status: DC
  Filled 2021-03-15: qty 90, 30d supply, fill #0

## 2021-03-15 MED ORDER — DICLOFENAC SODIUM 1 % EX GEL
4.0000 g | Freq: Four times a day (QID) | CUTANEOUS | 0 refills | Status: DC
  Filled 2021-03-15: qty 200, 14d supply, fill #0

## 2021-03-15 MED ORDER — METOPROLOL TARTRATE 100 MG PO TABS
100.0000 mg | ORAL_TABLET | Freq: Two times a day (BID) | ORAL | 0 refills | Status: DC
Start: 2021-03-15 — End: 2021-04-19
  Filled 2021-03-15: qty 60, 30d supply, fill #0

## 2021-03-15 MED ORDER — LORATADINE 10 MG PO TABS
10.0000 mg | ORAL_TABLET | Freq: Every day | ORAL | 0 refills | Status: DC
  Filled 2021-03-15: qty 30, 30d supply, fill #0

## 2021-03-15 MED ORDER — POLYETHYLENE GLYCOL 3350 17 GM/SCOOP PO POWD
17.0000 g | Freq: Two times a day (BID) | ORAL | 0 refills | Status: DC
  Filled 2021-03-15: qty 476, 14d supply, fill #0

## 2021-03-15 MED ORDER — CYCLOBENZAPRINE HCL 5 MG PO TABS
5.0000 mg | ORAL_TABLET | Freq: Three times a day (TID) | ORAL | 0 refills | Status: DC | PRN
  Filled 2021-03-15: qty 30, 10d supply, fill #0

## 2021-03-15 MED ORDER — LORAZEPAM 0.5 MG PO TABS
0.2500 mg | ORAL_TABLET | Freq: Every day | ORAL | 0 refills | Status: DC
Start: 2021-03-15 — End: 2021-04-20
  Filled 2021-03-15: qty 15, 30d supply, fill #0

## 2021-03-15 MED ORDER — INSULIN PEN NEEDLE 32G X 4 MM MISC
1.0000 "application " | Freq: Four times a day (QID) | 0 refills | Status: DC
  Filled 2021-03-15: qty 100, 25d supply, fill #0

## 2021-03-15 MED ORDER — SENNA 8.6 MG PO TABS
ORAL_TABLET | ORAL | 0 refills | Status: DC
  Filled 2021-03-15: qty 90, 30d supply, fill #0

## 2021-03-15 MED ORDER — TAMSULOSIN HCL 0.4 MG PO CAPS
0.4000 mg | ORAL_CAPSULE | Freq: Every day | ORAL | 0 refills | Status: DC
  Filled 2021-03-15: qty 30, 30d supply, fill #0

## 2021-03-15 MED ORDER — GABAPENTIN 100 MG PO CAPS
100.0000 mg | ORAL_CAPSULE | Freq: Three times a day (TID) | ORAL | 0 refills | Status: DC
  Filled 2021-03-15: qty 90, 30d supply, fill #0

## 2021-03-15 MED ORDER — ATORVASTATIN CALCIUM 80 MG PO TABS
80.0000 mg | ORAL_TABLET | Freq: Every day | ORAL | 0 refills | Status: DC
  Filled 2021-03-15: qty 30, 30d supply, fill #0

## 2021-03-15 MED ORDER — PANTOPRAZOLE SODIUM 40 MG PO TBEC
40.0000 mg | DELAYED_RELEASE_TABLET | Freq: Every day | ORAL | 0 refills | Status: DC
Start: 2021-03-15 — End: 2021-04-19
  Filled 2021-03-15: qty 30, 30d supply, fill #0

## 2021-03-15 MED ORDER — NYSTATIN 100000 UNIT/GM EX POWD
Freq: Three times a day (TID) | CUTANEOUS | 0 refills | Status: DC
  Filled 2021-03-15: qty 30, 14d supply, fill #0

## 2021-03-15 MED ORDER — CLOPIDOGREL BISULFATE 75 MG PO TABS
75.0000 mg | ORAL_TABLET | Freq: Every day | ORAL | 0 refills | Status: DC
  Filled 2021-03-15: qty 30, 30d supply, fill #0

## 2021-03-15 NOTE — Progress Notes (Addendum)
Inpatient Rehabilitation Discharge Medication Review by a Pharmacist  A complete drug regimen review was completed for this patient to identify any potential clinically significant medication issues.  High Risk Drug Classes Is patient taking? Indication by Medication  Antipsychotic No   Anticoagulant No   Antibiotic No   Opioid No   Antiplatelet Yes Plavix for CVA prophx.  Hypoglycemics/insulin Yes SSI, meal coverage, Lantus/Semglee for DM  Vasoactive Medication Yes Norvasc, lisinopril, metoprolol for HTN  Chemotherapy No   Other No      Clinically significant medication issues were identified that warrant physician communication and completion of prescribed/recommended actions by midnight of the next day:  No  Time spent performing this drug regimen review (minutes):  10 min  Pat Sires S. Alford Highland, PharmD, BCPS Clinical Staff Pharmacist [email or web address] [place] 03/15/2021 2:03 PM

## 2021-03-15 NOTE — Progress Notes (Signed)
Occupational Therapy Session Note  Patient Details  Name: Lori [name] MRN: [id number] Date of Birth: 1970/08/25  Today's Date: 03/15/2021 OT Individual Time: 0840-0930 (AM session) and 1445-1554 (PM session) OT Individual Time Calculation (min): 50 min  and 69 min       Today's Date: 03/15/2021 OT Missed Time: 10 Minutes (AM session) Missed Time Reason: Other (comment) (previos pt care)   Short Term Goals: Week 1:  OT Short Term Goal 1 (Week 1): Pt will transfer to BSC/toilet with 1 assist and LRAD OT Short Term Goal 1 - Progress (Week 1): Met OT Short Term Goal 2 (Week 1): Pt will don shirt with MOD A with hemitechnique OT Short Term Goal 2 - Progress (Week 1): Progressing toward goal OT Short Term Goal 3 (Week 1): Pt will don pants over hips in standing with MOD A OT Short Term Goal 3 - Progress (Week 1): Progressing toward goal Week 2:  OT Short Term Goal 1 (Week 2): STG = LTG 2/2 LOS at Min/Mod   Skilled Therapeutic Interventions/Progress Updates:    Pt greeted at time of session 10 minutes late 2/2 previous pt care. Pt bed level resting agreeable to OT session, no pain. Aware of grad day and stating husband to be coming in soon for nursing training. Bed mobility supine > sit Mod A and stand pivot to wheelchair CGA with RW, cues to prevent sitting prematurely and upright posture. Pt stating she wants to complete ADL this afternoon during session when clothes have been dried and wants to try shower. Set up at this time at sink level, pt requesting assist with grooming hair and getting out knots, OT assisting with grooming task 2/2 difficulty level and discussion with the pt throughout regarding DC home, recommend timed toileting, recommend 24/7 supervision, home layout, etc. Pt performing oral hygiene with set up. Up in chair alarm on call bell in reach.   Session 2: Pt greeted at time of session bed level resting, agreeable to OT session. No pain. Pt wanting to shower this session.  Initial part of session spent retrieving items for shower and pt clothes from dryer. Bed mobility with Min/Mod to sit EOB, stand pivot bed <> wheelchair <> shower bench in shower with posterior entry all with Min/CGA with pt needing multimodal cues to turn fully and not sit prematurely. Pt performing UB/LB bathing with Mod A overall to fully wash buttocks and feet, max encouragement to participate and attempt tasks before asking for help. Transferred back to wheelchair same manner, set up at sink for grooming tasks and again max cues to participate. Note cues also needed to attend to L side of head during shampooing and grooming tasks. Pt only wanting to don gown at this time, donned with MOD A. Stand pivot back to bed Min/CGA. Alarm on call bell in reach. No further questions regarding DC home tomorrow.   Therapy Documentation Precautions:  Precautions Precautions: Fall Precaution Comments: L hemi (UE>LE), PICC R UE Restrictions Weight Bearing Restrictions: No    Therapy/Group: Individual Therapy  [name] 03/15/2021, 7:17 AM

## 2021-03-15 NOTE — Progress Notes (Signed)
Physical Therapy Discharge Summary  Patient Details  Name: Lori [name] MRN: [id number] Date of Birth: [birth year]  Today's Date: 03/15/2021 PT Individual Time: 1100-1138 PT Individual Time Calculation (min): 38 min    Patient has met 5 of 5 long term goals due to improved balance, improved postural control, increased strength, and improved awareness.  Patient to discharge at a wheelchair level [place].   Patient's care partner is independent to provide the necessary physical assistance at discharge. Pt performs bed mobility with min A with max VC and instruction. Sit to stand with as little as CGA, but typically min A. Stand pivot transfer and gait with min A. Pt demoes premature sitting and requires close w/c follow for all gait. [name], Ilias participated in family training and demoed appropriate guarding with instruction.   Reasons goals not met: NA  Recommendation:  Patient will benefit from ongoing skilled PT services in home health setting to continue to advance safe functional mobility, address ongoing impairments in strength, balance, activity tolerance, and minimize fall risk.  Equipment: W/c and RW  Reasons for discharge: treatment goals met and discharge from hospital  Patient/family agrees with progress made and goals achieved: Yes  Skilled Therapeutic Interventions/Progress Updates:  pt received in bed and agreeable to therapy. Reports headache but denies any intervention. Bed mobility min A with bed features and VC. Pt then donned shoes with tot A. Pt required several attempts to stand with min A, including 1 posterior LOB with uncontrolled sitting. Pt transported to gym for energy conservation. Provided education on d/c and goals, discussing progress. Performed d/c assessments as documented below. Pt then ambulated x 40 ft before beginning to sit without communicating need to sit. Pt then stated she was dizzy and requested to lie down. Returned to room and to bed  in the same manner as above. Required incr time for all tasks. Pt remained in bed and was left with all needs in reach and alarm active.   PT Discharge Precautions/Restrictions Precautions Precautions: Fall Precaution Comments: L hemi (UE>LE) Restrictions Weight Bearing Restrictions: No Vital Signs   Pain Pain Assessment Pain Scale: 0-10 Pain Score: 0-No pain Pain Interference Pain Interference Pain Effect on Sleep: 2. Occasionally Pain Interference with Therapy Activities: 1. Rarely or not at all Pain Interference with Day-to-Day Activities: 2. Occasionally Vision/Perception  Vision - History Ability to See in Adequate Light: 1 Impaired Perception Perception: Impaired Praxis Praxis: Intact Praxis Impairment Details: Motor planning  Cognition Overall Cognitive Status: Impaired/Different from baseline Arousal/Alertness: Awake/alert Orientation Level: Oriented X4 Year: 2023 Month: February Day of Week: Correct Attention: Alternating Focused Attention: Appears intact Sustained Attention: Appears intact Alternating Attention: Impaired Memory: Impaired Memory Impairment: Decreased recall of new information Awareness: Impaired Awareness Impairment: Anticipatory impairment Problem Solving: Impaired Problem Solving Impairment: Verbal complex;Functional complex Self Monitoring: Impaired Self Monitoring Impairment: Verbal complex;Functional complex Self Correcting: Impaired Self Correcting Impairment: Verbal complex;Functional complex Safety/Judgment: Impaired Sensation Sensation Light Touch: Appears Intact Hot/Cold: Not tested Proprioception: Appears Intact Stereognosis: Not tested Additional Comments: Slightly impaired, 3 toes of R foot Coordination Gross Motor Movements are Fluid and Coordinated: Yes Fine Motor Movements are Fluid and Coordinated: Yes Coordination and Movement Description: old L hemi UE>LE, flexed posture Motor  Motor Motor: Hemiplegia Motor -  Skilled Clinical Observations: L hemi  Mobility Bed Mobility Bed Mobility: Supine to Sit;Sit to Supine Supine to Sit: Minimal Assistance - Patient > 75% Sit to Supine: Minimal Assistance - Patient > 75% Transfers Transfers: Sit to Stand;Stand  to Sit;Stand Pivot Transfers Sit to Stand: Minimal Assistance - Patient > 75% Stand to Sit: Minimal Assistance - Patient > 75% Stand Pivot Transfers: Minimal Assistance - Patient > 75% Stand Pivot Transfer Details: Verbal cues for precautions/safety;Verbal cues for safe use of DME/AE;Manual facilitation for placement;Tactile cues for weight beaing Transfer (Assistive device): Rolling walker Locomotion  Gait Ambulation: Yes Gait Assistance: Minimal Assistance - Patient > 75% Gait Distance (Feet): 107 Feet Assistive device: Rolling walker Gait Assistance Details: Verbal cues for safe use of DME/AE;Verbal cues for precautions/safety;Verbal cues for sequencing;Verbal cues for gait pattern;Tactile cues for posture Gait Gait: Yes Gait Pattern: Step-through pattern;Decreased step length - left;Decreased stance time - right;Trunk flexed;Shuffle;Left flexed knee in stance;Narrow base of support Gait velocity: very slow Stairs / Additional Locomotion Stairs: No Pick up small object from the floor assist level: Minimal Assistance - Patient > 75% Pick up small object from the floor assistive device: Air traffic controller Mobility: Yes Wheelchair Assistance: Chartered loss adjuster: Both lower extermities Wheelchair Parts Management: Needs assistance  Trunk/Postural Assessment  Cervical Assessment Cervical Assessment: Within Water engineer Thoracic Assessment: Exceptions to [facility] Lumbar Assessment Lumbar Assessment: Exceptions to [facility] Postural Control Postural Control: Deficits on evaluation Righting Reactions: delayed and inadequate  Balance Balance Balance Assessed:  Yes Standardized Balance Assessment Standardized Balance Assessment: PASS Postural Assessment Scale for Stroke Patients=PASS 1. Sitting Without Support: Can sit for 5 minutes without support 2. Standing With Support: Can stand with support of only 1 hand 3. Standing Without Support: Can stand without support for 10 seconds or leans leavily on 1 leg 4.Standing on Nonparetic Leg: Can stand on nonparetic leg for more than 5 seconds 5.Standing on Paretic Leg: Cannot stand on paretic leg MAINTAINING POSTURE SUBTOTAL: 9 6. Supine to Paretic Side Lateral: Can perform with little help 7. Supine to Nonparetic Side Lateral: Can perform with little help 8. Supine to Sitting Up on the Edge of the Mat: Can perform with little help 9. Sitting on the Edge of the Mat to Supine: Can perform with little help 10. Sitting to Standing Up: Can perform with little help 11. Standing Up to Sitting Down: Can perform with little help 12. Standing,Picking Up a Pencil from the Floor: Cannot perform CHANGING POSTURE SUBTOTAL: 12 PASS TOTAL SCORE: 21 Dynamic Sitting Balance Dynamic Sitting - Balance Support: Feet supported Dynamic Sitting - Level of Assistance: 5: Stand by assistance Dynamic Sitting - Balance Activities: Lateral lean/weight shifting;Forward lean/weight shifting Static Standing Balance Static Standing - Balance Support: During functional activity;Bilateral upper extremity supported Static Standing - Level of Assistance: 5: Stand by assistance Dynamic Standing Balance Dynamic Standing - Balance Support: During functional activity Dynamic Standing - Level of Assistance: 4: Min assist Extremity Assessment      RLE Assessment RLE Assessment: Exceptions to [facility] General Strength Comments: Grossly 4/5, improved from 3+/5 LLE Assessment LLE Assessment: Exceptions to [facility] General Strength Comments: Grossly 4/5, improved from 3+/5    Mickel Fuchs 03/15/2021, 12:04 PM

## 2021-03-15 NOTE — Progress Notes (Addendum)
Patient ID: Lori [name], female   DOB: Sep 27, 1970, 51 y.o.   MRN: [id number] Met with pt to inform her [employer] reports she got a wheelchair 01/18/2021 and can not get another one for five years. Pt reports she left it at [facility], called them and they do not have it. Pt reports she has an old one at [facility] she can use. She then may buy one on-line since would be cheaper then going through a equipment company. Several concerns regarding pt going home due who will be her care provider, the amount of care she requires and now going to [name] on the third floor which is not safe since she can go up and down stairs. Pt and family want to try but this has not worked in the past. Pt also does not follow the diet she needs to be on and her diabetes is out of control when she eats lots of sugar. Pt is adamant she is not going to a nursing home. Will work on best case scenario for home  2:15 PM [name] has pt's wheelchair just confirmed. Referral faxed for PCS referral to [facility]. PTAR scheduled for 10:00 aware of the three flights of stairs needing to get up

## 2021-03-15 NOTE — Progress Notes (Signed)
Education completed with pt and pts husband regarding diabetes, insulin pen, and glucometer. Husband voices full understanding at this time. All questions/concerns answered. Encouraged husband to let staff know if he has any additional questions or concerns. Educational material at pts bedside.

## 2021-03-15 NOTE — Plan of Care (Signed)
Problem: RH Swallowing Goal: LTG Pt will demonstrate functional change in swallow as evidenced by bedside/clinical objective assessment (SLP) Description: LTG: Patient will demonstrate functional change in swallow as evidenced by bedside/clinical objective assessment (SLP) Outcome: Completed/Met   Problem: RH Problem Solving Goal: LTG Patient will demonstrate problem solving for (SLP) Description: LTG:  Patient will demonstrate problem solving for basic/complex daily situations with cues  (SLP) Outcome: Completed/Met   Problem: RH Memory Goal: LTG Patient will use memory compensatory aids to (SLP) Description: LTG:  Patient will use memory compensatory aids to recall biographical/new, daily complex information with cues (SLP) Outcome: Completed/Met   Problem: RH Awareness Goal: LTG: Patient will demonstrate awareness during functional activites type of (SLP) Description: LTG: Patient will demonstrate awareness during functional activites type of (SLP) Outcome: Completed/Met   Problem: RH Attention Goal: LTG Patient will demonstrate this level of attention during functional activites (SLP) Description: LTG:  Patient will will demonstrate this level of attention during functional activites (SLP) Outcome: Completed/Met

## 2021-03-15 NOTE — Progress Notes (Signed)
Hypoglycemic Event  CBG: 67 at 17  Treatment: 4 oz juice/soda  Symptoms: None  Follow-up CBG: Time:1744 CBG Result:142  Possible Reasons for Event: Unknown    Debbora Presto

## 2021-03-15 NOTE — Progress Notes (Signed)
PROGRESS NOTE   Subjective/Complaints:  Pt reports she's leaving tomorrow- no concerns today- excited about d/c.   ROS: limited by cognition Objective:   No results found. No results for input(s): WBC, HGB, HCT, PLT in the last 72 hours.   No results for input(s): NA, K, CL, CO2, GLUCOSE, BUN, CREATININE, CALCIUM in the last 72 hours.     Intake/Output Summary (Last 24 hours) at 03/15/2021 0910 Last data filed at 03/14/2021 1837 Gross per 24 hour  Intake 237 ml  Output --  Net 237 ml         Physical Exam: Vital Signs Blood pressure 115/75, pulse 88, temperature 97.8 F (36.6 C), resp. rate 18, height 5\' 2"  (1.575 m), weight 96.3 kg, SpO2 99 %.      General: awake, alert, appropriate, laying supine in bed; slef limiting; NAD HENT: dysconjugate gaze; oropharynx moist CV: regular rate; no JVD Pulmonary: CTA B/L; no W/R/R- good air movement GI: soft, NT, ND, (+)BS Psychiatric: appropriate Neurological: alert- more itneractive Musculoskeletal: has hyperextension of multiple fingers on L hand at rest- no change    Cervical back: Normal range of motion and neck supple.  Skin:    General: Skin is warm and dry.  Neurological:     Mental Status: She is alert but slow to process.  Follows simple ommands.     Comments: Dysconjugate gaze.     RUE and RLE grossly 4- to 4/5. LUE 3/5 at shoulder,elbow but limited d/t flexor contracture and hypertonicity at left wrist/fingers   RLE 4/5. LLE 3 to 3+/5.  Hyperreflexic on left.  Also with mild inattention to left.   Assessment/Plan: 1. Functional deficits which require 3+ hours per day of interdisciplinary therapy in a comprehensive inpatient rehab setting. Physiatrist is providing close team supervision and 24 hour management of active medical problems listed below. Physiatrist and rehab team continue to assess barriers to discharge/monitor patient progress toward functional  and medical goals  Care Tool:  Bathing    Body parts bathed by patient: Left arm, Chest, Abdomen, Right upper leg, Left upper leg, Front perineal area, Face, Buttocks   Body parts bathed by helper: Buttocks, Right arm, Right lower leg, Left lower leg     Bathing assist Assist Level: Maximal Assistance - Patient 24 - 49%     Upper Body Dressing/Undressing Upper body dressing   What is the patient wearing?: Pull over shirt    Upper body assist Assist Level: Maximal Assistance - Patient 25 - 49%    Lower Body Dressing/Undressing Lower body dressing      What is the patient wearing?: Incontinence brief, Pants     Lower body assist Assist for lower body dressing: Total Assistance - Patient < 25%     Toileting Toileting    Toileting assist Assist for toileting: Dependent - Patient 0%     Transfers Chair/bed transfer  Transfers assist     Chair/bed transfer assist level: Contact Guard/Touching assist Chair/bed transfer assistive device: Walker   Locomotion Ambulation   Ambulation assist   Ambulation activity did not occur: Safety/medical concerns  Assist level: Moderate Assistance - Patient 50 - 74% Assistive device: Walker-rolling Max  distance: 63ft   Walk 10 feet activity   Assist  Walk 10 feet activity did not occur: Safety/medical concerns        Walk 50 feet activity   Assist Walk 50 feet with 2 turns activity did not occur: Safety/medical concerns         Walk 150 feet activity   Assist Walk 150 feet activity did not occur: Safety/medical concerns         Walk 10 feet on uneven surface  activity   Assist Walk 10 feet on uneven surfaces activity did not occur: Safety/medical concerns         Wheelchair     Assist Is the patient using a wheelchair?: Yes Type of Wheelchair: Manual    Wheelchair assist level: Dependent - Patient 0%      Wheelchair 50 feet with 2 turns activity    Assist        Assist  Level: Dependent - Patient 0%   Wheelchair 150 feet activity     Assist      Assist Level: Dependent - Patient 0%   Blood pressure 115/75, pulse 88, temperature 97.8 F (36.6 C), resp. rate 18, height 5\' 2"  (1.575 m), weight 96.3 kg, SpO2 99 %.  Medical Problem List and Plan: 1. 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  Antithrombotics: -DVT/anticoagulation:  Pharmaceutical: Lovenox             -antiplatelet therapy: Plavix.  3. Pain Management/spasticity: Tylenol prn.              -ROM with therapy             -consider botox, defer to primary team  2/7- pain controlled- con't regimen  2/8- will start Fioricet prn since cannot use Triptans in pt with stroke- started having migraines again.  4. Mood: LCSW to follow for evaluation and support.              -antipsychotic agents: N/A 5. Neuropsych: This patient may be capable of making decisions on her own behalf.             -will need to investigate further with family input.  6. Skin/Wound Care:  Routine pressure relief measures.  7. Fluids/Electrolytes/Nutrition: Monitor I/O. Check CMET in am. 8. T2DM: Hgb A1c-9.0 poorly controlled. Has been on insulin for the past year.              --came in with DKA -->on Glucerna TID BETWEEN meals. Will change to lower carb Ensure max with juven for low protein stores.              --Continue Insulin gargline with 25 units at night and 3 units TID with meals  1/24- will monitor for trend- is elevated- and change as of tomorrow  1/26- CBGs 98 to 249- pt asking for juices, sweets all the time per team - they are having to limit sugar intake. Will increase meal coverage to 4 units TID  2/2- CBGs doing much better with increase in Semglee- con't  regimen- nursing having to stop pt from drinking juices all the time.  2/3- CBGs keeps going up - likely snacks/juices- 86-200- will con't regimen for now  2/8- 1 episode high 238- due to juice per nursing- con't regimen  2/9- CBGs look great- 88-175- con't regimen 9.  Hyperthyroid: Recent diagnosis and now on Tapazole             --continue to monitor HR TID.  10. Gastroparesis?: Changed reglan to po route.             --monitor for TDK  1/26- will remove PICC since taking Reglan PO- LBM this AM- is going. 1/31- gastroparesis better and stable on Reglan PO- although pt likes better IV.   11. H/o anxiety/depression: Continue Lexapro with ativan at bedtime.             --melatonin for sleep wake disruption.  12. Hyponatremia: Monitor Sodium level with serial checks as trending down.              --1/25- Up to 136 - will monitor  2/2- Na 139- con't to monitor  13. HTN: SBP goal 130-150 due to severe intracranial atherosclerosis.  --Monitor BP TID. Continue Zestril.   1/28 bp controlled 14. Constipation 2/2- LBM yesterday- con't to monitor 2/6- LBM this AM  15. Dispo  2/2- will write disability letter 21. urinary retention  2/1- cannot void- U/A pending- will treat as soon as know has UTI-   2/2- gave 1 dose of ABX yesterday but U/A (-)- started flomax yesterday- explained will take a few days and hopefully help- if continues, will need Urology outpt f/u- explained to pt that is an outpt w/u.   2/3- will recheck U/A and Cx- even though last one was (-), I don't have a reason she just stopped voiding- and Cx showed multiple species- will recollect.   2/4: UA appears negative, f/u UC  2/6- Urine Cx was (-) this time- per chart, is voiding now? Will double check with nursing  2/8- voiding in general- <200cc bladder scans after voiding. But sometimes not drinking, so is dry more.   2/9- voiding -not requiring in/out caths 17. Left hand pain: placed order to ice.  18. Migraines- last had 5  years ago- started 2 days ago  2/8- will give Fioricet prn. 2/9- hasn't had another migraine- but con't - 7 days of Fioricit at d/c.     I spent a total of  35  minutes on total care today- >50% coordination of care- due to writing disability letter and speaking with SW   LOS: 17 days A FACE TO FACE EVALUATION WAS PERFORMED  Naser Schuld 03/15/2021, 9:10 AM

## 2021-03-15 NOTE — Progress Notes (Signed)
Occupational Therapy Discharge Summary  Patient Details  Name: Lori [name] MRN: [id number] Date of Birth: [birth year]    Patient has met 6 of 10 long term goals due to improved activity tolerance, improved balance, postural control, and improved coordination.  Patient to discharge at [facility] Assist - Mod level.  Patient's care partner is independent to provide the necessary physical and cognitive assistance at discharge.  Pt has been significantly self limiting during her CIR stay, needing Max cues to attempt tasks before asking for help. Pt is DC home at a Max A for LB dressing, bathing, and toileting tasks. Pt is CGA/Min for stand pivot transfers and short distance mobility with RW, needing max cues at times to prevent premature sitting. Husband present for family ed on 2/8. Recommending 24/7 Supervision.   Reasons goals not met: Pt continues to need Max A with LB dress, toileting. Pt is overall CGA/Min for standing balance.  Recommendation:  Patient will benefit from ongoing skilled OT services in home health setting to continue to advance functional skills in the area of BADL and Reduce care partner burden.  Equipment: TTB  Reasons for discharge: discharge from hospital  Patient/family agrees with progress made and goals achieved: Yes  OT Discharge Precautions/Restrictions  Precautions Precautions: Fall Precaution Comments: L hemi (UE>LE) Restrictions Weight Bearing Restrictions: No Pain Pain Assessment Pain Scale: 0-10 Pain Score: 0-No pain ADL ADL Eating: Set up Grooming: Minimal assistance Upper Body Bathing: Minimal assistance Lower Body Bathing: Moderate assistance Upper Body Dressing: Moderate assistance Lower Body Dressing: Maximal assistance Toileting: Maximal assistance Toilet Transfer: Contact guard, Minimal assistance Vision Baseline Vision/History: 1 Wears glasses Patient Visual Report: No change from baseline Vision Assessment?: Vision impaired-  to be further tested in functional context Perception  Perception: Impaired Praxis Praxis: Intact Praxis Impairment Details: Motor planning Cognition Overall Cognitive Status: Impaired/Different from baseline Arousal/Alertness: Awake/alert Orientation Level: Oriented X4 Year: 2023 Month: February Day of Week: Correct Attention: Alternating Focused Attention: Appears intact Sustained Attention: Appears intact Alternating Attention: Impaired Memory: Impaired Memory Impairment: Decreased recall of new information Immediate Memory Recall: Sock;Blue;Bed Memory Recall Sock: Without Cue Memory Recall Blue: Without Cue Memory Recall Bed: Without Cue Awareness: Impaired Awareness Impairment: Anticipatory impairment Problem Solving: Impaired Problem Solving Impairment: Verbal complex;Functional complex Self Monitoring: Impaired Self Monitoring Impairment: Verbal complex;Functional complex Self Correcting: Impaired Self Correcting Impairment: Verbal complex;Functional complex Safety/Judgment: Impaired Sensation Sensation Light Touch: Appears Intact Hot/Cold: Not tested Proprioception: Appears Intact Stereognosis: Not tested Additional Comments: Slightly impaired, 3 toes of R foot Coordination Gross Motor Movements are Fluid and Coordinated: Yes Fine Motor Movements are Fluid and Coordinated: No Coordination and Movement Description: old L hemi UE>LE, flexed posture Motor  Motor Motor: Hemiplegia Motor - Skilled Clinical Observations: L hemi Mobility  Bed Mobility Bed Mobility: Supine to Sit;Sit to Supine Supine to Sit: Minimal Assistance - Patient > 75% Sit to Supine: Minimal Assistance - Patient > 75% Transfers Sit to Stand: Minimal Assistance - Patient > 75%;Contact Guard/Touching assist Stand to Sit: Minimal Assistance - Patient > 75%;Contact Guard/Touching assist  Trunk/Postural Assessment  Cervical Assessment Cervical Assessment: Within Functional Limits Thoracic  Assessment Thoracic Assessment: Exceptions to [facility] Lumbar Assessment Lumbar Assessment: Exceptions to [facility] Postural Control Postural Control: Deficits on evaluation Righting Reactions: delayed and inadequate  Balance Balance Balance Assessed: Yes Standardized Balance Assessment Standardized Balance Assessment: PASS Postural Assessment Scale for Stroke Patients=PASS 1. Sitting Without Support: Can sit for 5 minutes without support 2. Standing With Support: Can stand with  support of only 1 hand 3. Standing Without Support: Can stand without support for 10 seconds or leans leavily on 1 leg 4.Standing on Nonparetic Leg: Can stand on nonparetic leg for more than 5 seconds 5.Standing on Paretic Leg: Cannot stand on paretic leg MAINTAINING POSTURE SUBTOTAL: 9 6. Supine to Paretic Side Lateral: Can perform with little help 7. Supine to Nonparetic Side Lateral: Can perform with little help 8. Supine to Sitting Up on the Edge of the Mat: Can perform with little help 9. Sitting on the Edge of the Mat to Supine: Can perform with little help 10. Sitting to Standing Up: Can perform with little help 11. Standing Up to Sitting Down: Can perform with little help 12. Standing,Picking Up a Pencil from the Floor: Cannot perform CHANGING POSTURE SUBTOTAL: 12 PASS TOTAL SCORE: 21 Dynamic Sitting Balance Dynamic Sitting - Balance Support: Feet supported Dynamic Sitting - Level of Assistance: 5: Stand by assistance Dynamic Sitting - Balance Activities: Lateral lean/weight shifting;Forward lean/weight shifting Static Standing Balance Static Standing - Balance Support: During functional activity;Bilateral upper extremity supported Static Standing - Level of Assistance: 5: Stand by assistance Dynamic Standing Balance Dynamic Standing - Balance Support: During functional activity Dynamic Standing - Level of Assistance: 4: Min assist Dynamic Standing - Balance Activities: Forward lean/weight  shifting Extremity/Trunk Assessment RUE Assessment RUE Assessment: Exceptions to [facility] General Strength Comments: very slow deliberate movements, roughly 3+/5 limited by weakness LUE Assessment LUE Assessment: Exceptions to [facility] General Strength Comments: roughly 2/5 limited by old L hemi   [name] 03/15/2021, 12:53 PM

## 2021-03-15 NOTE — Progress Notes (Addendum)
Patient ID: Lori [name], female   DOB: December 14, 1970, 51 y.o.   MRN: [id number]  Met with pt who reports the heat is not working at the home they are renting she will need to go to [place] now for one week for this to be fixed. Have contacted ambulance and set up transport for tomorrow at 10:00. Husband will need to come to hospital tomorrow to get the wheelchair that is coming later today. Pam-PA aware of this and husband to come in to go over Dc instructions

## 2021-03-15 NOTE — Progress Notes (Signed)
Hypoglycemic Event  CBG: 69  Treatment: 4 oz juice/soda  Symptoms: None  Follow-up CBG: Time:1710 CBG Result:67  Possible Reasons for Event: Unknown     Debbora Presto

## 2021-03-16 ENCOUNTER — Other Ambulatory Visit (HOSPITAL_COMMUNITY): Payer: Self-pay

## 2021-03-16 DIAGNOSIS — R5381 Other malaise: Principal | ICD-10-CM

## 2021-03-16 DIAGNOSIS — G43909 Migraine, unspecified, not intractable, without status migrainosus: Secondary | ICD-10-CM

## 2021-03-16 DIAGNOSIS — K3184 Gastroparesis: Secondary | ICD-10-CM

## 2021-03-16 DIAGNOSIS — E1143 Type 2 diabetes mellitus with diabetic autonomic (poly)neuropathy: Secondary | ICD-10-CM

## 2021-03-16 LAB — GLUCOSE, CAPILLARY
Glucose-Capillary: 133 mg/dL — ABNORMAL HIGH (ref 70–99)
Glucose-Capillary: 167 mg/dL — ABNORMAL HIGH (ref 70–99)

## 2021-03-16 MED ORDER — BUTALBITAL-APAP-CAFFEINE 50-325-40 MG PO TABS
1.0000 | ORAL_TABLET | Freq: Four times a day (QID) | ORAL | 0 refills | Status: DC | PRN
  Filled 2021-03-16: qty 14, 4d supply, fill #0

## 2021-03-16 MED ORDER — BLOOD GLUCOSE MONITOR KIT: PACK | 0 refills | Status: DC

## 2021-03-16 MED ORDER — LIP MEDEX EX OINT
TOPICAL_OINTMENT | CUTANEOUS | 0 refills | Status: DC | PRN
  Filled 2021-03-16: qty 7, fill #0

## 2021-03-16 NOTE — Discharge Summary (Signed)
Physician Discharge Summary  Patient ID: Lori [name] MRN: [id number] DOB/AGE: 03-28-1970 51 y.o.  Admit date: 02/26/2021 Discharge date: 03/16/2021  Discharge Diagnoses:  Principal Problem:   Debility Active Problems:   Diabetes [facility])   Essential hypertension   Depression   Constipation   History of cerebral infarction   Diabetic gastroparesis ([place])   Migraines   Discharged Condition: stable  Significant Diagnostic Studies: DG Chest 2 View  Result Date: 03/02/2021 CLINICAL DATA:  Rehab, aspiration EXAM: CHEST - 2 VIEW COMPARISON:  02/07/2021 FINDINGS: Upper normal heart size post CABG. Mediastinal contours and pulmonary vascularity normal. Minimal subsegmental atelectasis at RIGHT base. Lungs otherwise clear. No infiltrate, pleural effusion, or pneumothorax. No acute osseous findings. IMPRESSION: Post CABG. Minimal RIGHT basilar atelectasis. Electronically Signed   By: Lavonia Dana M.D.   On: 03/02/2021 16:10    Labs:  Basic Metabolic Panel: BMP Latest Ref Rng & Units 03/12/2021 03/05/2021 02/27/2021  Glucose 70 - 99 mg/dL 119(H) 162(H) 196(H)  BUN 6 - 20 mg/dL 6 5(L) 12  Creatinine 0.44 - 1.00 mg/dL 0.77 0.71 0.76  Sodium 135 - 145 mmol/L 138 139 136  Potassium 3.5 - 5.1 mmol/L 4.7 4.1 4.3  Chloride 98 - 111 mmol/L 109 106 103  CO2 22 - 32 mmol/L 20(L) 24 25  Calcium 8.9 - 10.3 mg/dL 8.8(L) 8.8(L) 8.6(L)     CBC: CBC Latest Ref Rng & Units 03/12/2021 03/08/2021 03/05/2021  WBC 4.0 - 10.5 K/uL 6.8 7.2 6.0  Hemoglobin 12.0 - 15.0 g/dL 11.2(L) 11.6(L) 11.8(L)  Hematocrit 36.0 - 46.0 % 34.5(L) 34.8(L) 35.3(L)  Platelets 150 - 400 K/uL 355 364 416(H)     CBG: Recent Labs  Lab 03/15/21 1713 03/15/21 1747 03/15/21 2116 03/16/21 0530 03/16/21 1146  GLUCAP 67* 142* 208* 133* 167*    Brief HPI:   Lori [name] is a 51 y.o. female with history of T2DM, HTN, multiple prior strokes most recent admission to [facility] on 01/29/21 with BLE weakness and projectile vomiting.   MRI brain showed multiple old infarcts and moderate volume loss and no acute changes.  Neurology felt that patient had with recrudescence of symptoms in setting of GI illness and recommended SBP goal 130-150 given severe intracranial stenosis.  She was discharged to [facility] for rehab but has had recurrent admission on 01/16 for nausea and vomiting with DKA.  She was treated with IV insulin, IV fluids as well as Cardizem for rate control.  IV Reglan added for GI symptoms which were felt to be due to gastroparesis.  DKA had resolved and insulin was titrated for better control.  She was noted to be deconditioned and therapies were initiated.  Patient was showing good participation with eager to return to prior level of function.  CIR was recommended due to functional decline.   Hospital Course: Ytzel Neclos Becton was admitted to rehab 02/26/2021 for inpatient therapies to consist of PT, ST and OT at least three hours five days a week. Past admission physiatrist, therapy team and rehab RN have worked together to provide customized collaborative inpatient rehab.  She was maintained on Plavix during his stay.  Her blood pressures were monitored on TID basis and has been managed on Zestril.  Bowel program was augmented to manage constipation.  Reglan was changed to p.o. route and GI symptoms have been managed on current dose.  Her diabetes has been monitored with ac/hs CBG checks and SSI was use prn for tighter BS control.  Insulin glargine  was titrated upwards with 4 units NovoLog added TID for meal coverage.   She was instructed on continuing to monitor blood sugars 2-3 times a day after discharge and follow-up with PCP for further adjustment  Follow up labs showed that hyponatremia has resolved and renal status is stable.  Repeat CBC shows H&H and platelets to be relatively stable on Plavix.  She briefly had issues with urinary retention and UA checked x2 was equivocal.  She was treated with a dose of  fosfomycin and started on Flomax with resolution of retention.   Mood and anxiety have been managed on home regimen and have improved with improvement in activity.   Recurrent migraines have been managed with addition of Fioricet.  She was set up at [address] and wellness for primary care follow up after discharge.  She has made steady progress during his stay and currently requires min assist with activity. She will continue to receive follow up [place], [place], [place], [place] aide and [place] by [place] after discharge.   Rehab course: During patient's stay in rehab weekly team conferences were held to monitor patient's progress, set goals and discuss barriers to discharge. At admission, patient required max to total assist with basic ADL tasks and mod to max assist for mobility.  She exhibited mild cognitive impairments with deficits in short-term recall, mental flexibility and executive function.She  has had improvement in activity tolerance, balance, postural control as well as ability to compensate for deficits she is able to perform bed mobility with min assist and max verbal cues.  She is able to stand with min assist and occasionally contact-guard assist.  She requires min assist to perform stand pivot transfers and to ambulate 170 feet with rolling walker.  She requires supervision for high-level cognitive tasks and is tolerating regular diet with safe swallow strategies.  Family education was completed with husband.    Discharge disposition: 01-Home or Self Care  Diet: Carb modified  Special Instructions: Monitor blood sugars bid-qid basis and follow up with PCP for further adjustment.   Allergies as of 03/16/2021       Reactions   Contrast Media [iodinated Contrast Media] Swelling   Penicillins Swelling   Mouth swells up and eyes swollen shut   Sumatriptan         Medication List     STOP taking these medications    aspirin 81 MG chewable tablet   calcium carbonate 500 MG  chewable tablet Commonly known as: TUMS - dosed in mg elemental calcium   feeding supplement (GLUCERNA SHAKE) Liqd   insulin aspart 100 UNIT/ML injection Commonly known as: novoLOG Replaced by: NovoLOG FlexPen 100 UNIT/ML FlexPen   insulin glargine 100 UNIT/ML injection Commonly known as: LANTUS Replaced by: Lantus SoloStar 100 UNIT/ML Solostar Pen   ondansetron 4 MG/2ML Soln injection Commonly known as: ZOFRAN   polyethylene glycol 17 g packet Commonly known as: MIRALAX / GLYCOLAX Replaced by: polyethylene glycol powder 17 GM/SCOOP powder   zolpidem 5 MG tablet Commonly known as: AMBIEN       TAKE these medications    acetaminophen 325 MG tablet Commonly known as: TYLENOL Take 2 tablets (650 mg total) by mouth every 6 (six) hours as needed for mild pain or headache.   amLODipine 5 MG tablet Commonly known as: NORVASC Take 1 tablet (5 mg total) by mouth daily.   atorvastatin 80 MG tablet Commonly known as: LIPITOR Take 1 tablet (80 mg total) by mouth daily.  BD Pen Needle Nano U/F 32G X 4 MM Misc Generic drug: Insulin Pen Needle Use to inject insulin 4 times daily as directed.   blood glucose meter kit and supplies Kit Dispense based on patient and insurance preference. Use up to four times daily as directed.   butalbital-acetaminophen-caffeine 50-325-40 MG tablet--Rx# 15 pills Commonly known as: FIORICET Take 1 tablet by mouth every 6 (six) hours as needed for migraine.   clopidogrel 75 MG tablet Commonly known as: PLAVIX Take 1 tablet (75 mg total) by mouth daily.   cyclobenzaprine 5 MG tablet Commonly known as: FLEXERIL Take 1 tablet (5 mg total) by mouth 3 (three) times daily as needed for muscle spasms. What changed:  when to take this reasons to take this   diclofenac Sodium 1 % Gel Commonly known as: VOLTAREN Apply 4 g topically 4 (four) times daily.   escitalopram 20 MG tablet Commonly known as: LEXAPRO Take 1 tablet (20 mg total) by mouth  daily.   gabapentin 100 MG capsule Commonly known as: NEURONTIN Take 1 capsule (100 mg total) by mouth 3 (three) times daily.   Lantus SoloStar 100 UNIT/ML Solostar Pen Generic drug: insulin glargine Inject 29 Units into the skin at bedtime. Replaces: insulin glargine 100 UNIT/ML injection   lip balm ointment Apply topically as needed for lip care.   lisinopril 20 MG tablet Commonly known as: ZESTRIL Take 1 tablet (20 mg total) by mouth daily.   loratadine 10 MG tablet Commonly known as: CLARITIN Take 1 tablet (10 mg total) by mouth daily.   LORazepam 0.5 MG tablet--Rx 15 pills Commonly known as: ATIVAN Take 1/2 tablet (0.25 mg total) by mouth at bedtime.   melatonin 3 MG Tabs tablet Take 3 tablets (9 mg total) by mouth at bedtime.   methimazole 5 MG tablet Commonly known as: TAPAZOLE Take 1 tablet (5 mg total) by mouth daily.   metoCLOPramide 10 MG tablet Commonly known as: REGLAN Take 1 tablet (10 mg total) by mouth 3 (three) times daily before meals.   metoprolol tartrate 100 MG tablet Commonly known as: LOPRESSOR Take 1 tablet (100 mg total) by mouth 2 (two) times daily.   NovoLOG FlexPen 100 UNIT/ML FlexPen Generic drug: insulin aspart Inject 4 Units into the skin 3 (three) times daily with meals. Replaces: insulin aspart 100 UNIT/ML injection   nystatin powder Commonly known as: MYCOSTATIN/NYSTOP Apply topically 3 (three) times daily to affected areas What changed: additional instructions   pantoprazole 40 MG tablet Commonly known as: PROTONIX Take 1 tablet (40 mg total) by mouth daily.   polyethylene glycol powder 17 GM/SCOOP powder Commonly known as: GLYCOLAX/MIRALAX Take 17 g by mouth 2 (two) times daily. Replaces: polyethylene glycol 17 g packet   senna 8.6 MG Tabs tablet Commonly known as: SENOKOT Take 2 tablets (17.2 mg total) by mouth daily AND 1 tablet (8.6 mg total) at bedtime. What changed: See the new instructions.   tamsulosin 0.4 MG  Caps capsule Commonly known as: FLOMAX Take 1 capsule (0.4 mg total) by mouth daily after supper.        Follow-up Information     Lovorn, Jinny Blossom, MD Follow up.   Specialty: Physical Medicine and Rehabilitation Contact information: [address] [place] [address] [phone]         Charlott Rakes, MD Follow up on 04/19/2021.   Specialty: Family Medicine Why: appointment @ 9:30 Am Contact information: [address] [place] [address] [phone]  Signed: Bary Leriche 03/16/2021, 7:17 PM

## 2021-03-16 NOTE — Progress Notes (Signed)
Pt being discharged at this time. All personal belongings packed and sent with pt. No questions or concerns voiced.

## 2021-03-16 NOTE — Progress Notes (Signed)
Recreational Therapy Discharge Summary Patient Details  Name: Lori [name] MRN: [id number] Date of Birth: [birth year] Today's Date: 03/16/2021  Long term goals set: 1  Long term goals met: 1  Comments on progress toward goals: TR sessions focused on leisure education, activity analysis/modifications, community reintegration education and components of wellness.  Pt participated in pt education session and is able to complete simple tasks seated with supervision/set up assist.  Pt did participate in community reintegration tasks at [facility] level for stand pivot transfers with verbal cuing.  Pt is schedule to discharge home today with family to provide 24 hour care.  Reasons goals not met: n/a  Equipment acquired: n/a  Reasons for discharge: discharge from hospital  Follow-up: [place] agrees with progress made and goals achieved: Yes  [name] 03/16/2021, 8:37 AM

## 2021-03-16 NOTE — Progress Notes (Signed)
Inpatient Rehabilitation Care Coordinator Discharge Note   Patient Details  Name: Lori [name] MRN: [id number] Date of Birth: 05-Aug-1970   Discharge location: HOME TO [name]'S HOME UNTIL HER AND HUSBAND'S HEAT IS FIXED HERE IN [place]  Length of Stay:  18 DAYS  Discharge activity level: MIN ASSIST LEVEL  Home/community participation: ACTIVE  Patient response EE:FEOFHQ Literacy - How often do you need to have someone help you when you read instructions, pamphlets, or other written material from your doctor or pharmacy?: Never  Patient response RF:XJOITG Isolation - How often do you feel lonely or isolated from those around you?: Never  Services provided included: MD, RD, PT, OT, SLP, RN, CM, TR, Pharmacy, SW  Financial Services:  Financial Services Utilized: Medicare    Choices offered to/list presented to: PT AND HUSBAND  Follow-up services arranged:  Home Health, DME, Patient/Family has no preference for HH/DME agencies [place]: [place]    DME : [facility] HAS OTHER NEEDED EQUIPMENT FROM PAST ADMISSIONS.  PCS REFERRAL MADE ALSO    Patient response to transportation need: Is the patient able to respond to transportation needs?: Yes In the past 12 months, has lack of transportation kept you from medical appointments or from getting medications?: Yes In the past 12 months, has lack of transportation kept you from meetings, work, or from getting things needed for daily living?: Yes    Comments (or additional information): [place] BUT WILL NOT BE THERE WITH HER ALL OF THE TIME. GOING TO [name]'S APARTMENT UNTIL THE HEAT IS FIXED ON HOME IN [place], SO CARE WILL FALL ON [name] AND HER TO HER  OTHER DAUGHTER'S. PT HOPEFULLY WILL BE COMPLIANT WITH HER DIET OTHERWISE WILL BE BACK IN THE HOSPITAL WITH NAUSEA AND VOMITING AS SHE WAS WITH THIS ADMISSION. MD GAVE PT LETTER FOR DISABILITY TO GET  REINSTATED.   Patient/Family verbalized understanding of follow-up arrangements:  Yes  Individual responsible for coordination of the follow-up plan: Margaretann Loveless [phone]  [facility] [phone]  Confirmed correct DME delivered: Elease Hashimoto 03/16/2021    Elease Hashimoto

## 2021-03-16 NOTE — Progress Notes (Signed)
PROGRESS NOTE   Subjective/Complaints:  Ready for d/c.    ROS: limited by cognition Objective:   No results found. No results for input(s): WBC, HGB, HCT, PLT in the last 72 hours.   No results for input(s): NA, K, CL, CO2, GLUCOSE, BUN, CREATININE, CALCIUM in the last 72 hours.     Intake/Output Summary (Last 24 hours) at 03/16/2021 0839 Last data filed at 03/16/2021 0546 Gross per 24 hour  Intake 480 ml  Output 500 ml  Net -20 ml         Physical Exam: Vital Signs Blood pressure (!) 142/79, pulse 98, temperature 98.3 F (36.8 C), temperature source Oral, resp. rate 18, height 5\' 2"  (1.575 m), weight 96.3 kg, SpO2 98 %.       General: awake, alert, appropriate, laying in supine NAD HENT: dysconjugate gaze; oropharynx moist CV: regular rate; no JVD Pulmonary: CTA B/L; no W/R/R- good air movement GI: soft, NT, ND, (+)BS Psychiatric: appropriate Neurological:  Musculoskeletal: has hyperextension of multiple fingers on L hand at rest- no change    Cervical back: Normal range of motion and neck supple.  Skin:    General: Skin is warm and dry.  Neurological:     Mental Status: She is alert but slow to process.  Follows simple ommands.     Comments: Dysconjugate gaze.     RUE and RLE grossly 4- to 4/5. LUE 3/5 at shoulder,elbow but limited d/t flexor contracture and hypertonicity at left wrist/fingers   RLE 4/5. LLE 3 to 3+/5.  Hyperreflexic on left.  Also with mild inattention to left.   Assessment/Plan: 1. Functional deficits which require 3+ hours per day of interdisciplinary therapy in a comprehensive inpatient rehab setting. Physiatrist is providing close team supervision and 24 hour management of active medical problems listed below. Physiatrist and rehab team continue to assess barriers to discharge/monitor patient progress toward functional and medical goals  Care Tool:  Bathing    Body parts  bathed by patient: Left arm, Chest, Abdomen, Right upper leg, Left upper leg, Front perineal area, Face, Buttocks   Body parts bathed by helper: Buttocks, Right arm, Right lower leg, Left lower leg     Bathing assist Assist Level: Moderate Assistance - Patient 50 - 74%     Upper Body Dressing/Undressing Upper body dressing   What is the patient wearing?: Pull over shirt    Upper body assist Assist Level: Moderate Assistance - Patient 50 - 74%    Lower Body Dressing/Undressing Lower body dressing      What is the patient wearing?: Incontinence brief, Pants     Lower body assist Assist for lower body dressing: Maximal Assistance - Patient 25 - 49%     Toileting Toileting    Toileting assist Assist for toileting: Maximal Assistance - Patient 25 - 49%     Transfers Chair/bed transfer  Transfers assist     Chair/bed transfer assist level: Contact Guard/Touching assist Chair/bed transfer assistive device: Programmer, multimedia   Ambulation assist   Ambulation activity did not occur: Safety/medical concerns  Assist level: Minimal Assistance - Patient > 75% Assistive device: Walker-rolling Max distance: 147ft  Walk 10 feet activity   Assist  Walk 10 feet activity did not occur: Safety/medical concerns  Assist level: Minimal Assistance - Patient > 75% Assistive device: Walker-rolling   Walk 50 feet activity   Assist Walk 50 feet with 2 turns activity did not occur: Safety/medical concerns  Assist level: Minimal Assistance - Patient > 75% Assistive device: Walker-rolling    Walk 150 feet activity   Assist Walk 150 feet activity did not occur: Safety/medical concerns         Walk 10 feet on uneven surface  activity   Assist Walk 10 feet on uneven surfaces activity did not occur: Safety/medical concerns         Wheelchair     Assist Is the patient using a wheelchair?: Yes Type of Wheelchair: Manual    Wheelchair assist  level: Supervision/Verbal cueing Max wheelchair distance: 59ft    Wheelchair 50 feet with 2 turns activity    Assist        Assist Level: Supervision/Verbal cueing   Wheelchair 150 feet activity     Assist      Assist Level: Maximal Assistance - Patient 25 - 49%   Blood pressure (!) 142/79, pulse 98, temperature 98.3 F (36.8 C), temperature source Oral, resp. rate 18, height 5\' 2"  (1.575 m), weight 96.3 kg, SpO2 98 %.  Medical Problem List and Plan: 1. 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  Antithrombotics: -DVT/anticoagulation:  Pharmaceutical: Lovenox             -antiplatelet therapy: Plavix.  3. Pain Management/spasticity: Tylenol prn.              -ROM with therapy             -consider botox, defer to primary team  2/7- pain controlled- con't regimen  2/8- will start Fioricet prn since cannot use Triptans in pt with stroke- started having migraines again.  4. Mood: LCSW to follow for evaluation and support.              -antipsychotic agents: N/A 5. Neuropsych: This patient may be capable of making decisions on her own behalf.             -will need to investigate further with family input.  6. Skin/Wound Care:  Routine pressure relief measures.  7. Fluids/Electrolytes/Nutrition: Monitor I/O. Check CMET in am. 8. T2DM: Hgb A1c-9.0 poorly controlled. Has been on insulin for the past year.              --came in with DKA -->on Glucerna TID BETWEEN meals. Will change to lower carb Ensure max with juven for low protein stores.              --Continue Insulin gargline with 25 units at night and 3 units TID with meals  1/24- will monitor for trend- is elevated- and change as of tomorrow  1/26- CBGs 98 to 249- pt  asking for juices, sweets all the time per team - they are having to limit sugar intake. Will increase meal coverage to 4 units TID  2/2- CBGs doing much better with increase in Semglee-  con't regimen- nursing having to stop pt from drinking juices all the time.   2/3- CBGs keeps going up - likely snacks/juices- 86-200- will con't regimen for now  2/8- 1 episode high 238- due to juice per nursing- con't regimen  2/9- CBGs look great- 88-175- con't regimen 9.  Hyperthyroid: Recent diagnosis and now on Tapazole             --continue to monitor HR TID.  10. Gastroparesis?: Changed reglan to po route.             --monitor for TDK  1/26- will remove PICC since taking Reglan PO- LBM this AM- is going. 1/31- gastroparesis better and stable on Reglan PO- although pt likes better IV.   11. H/o anxiety/depression: Continue Lexapro with ativan at bedtime.             --melatonin for sleep wake disruption.  12. Hyponatremia: Monitor Sodium level with serial checks as trending down.              --1/25- Up to 136 - will monitor  2/2- Na 139- con't to monitor  13. HTN: SBP goal 130-150 due to severe intracranial atherosclerosis.  --Monitor BP TID. Continue Zestril.   1/28 bp controlled 14. Constipation 2/2- LBM yesterday- con't to monitor 2/6- LBM this AM  15. Dispo  2/2- will write disability letter 36. urinary retention  2/1- cannot void- U/A pending- will treat as soon as know has UTI-   2/2- gave 1 dose of ABX yesterday but U/A (-)- started flomax yesterday- explained will take a few days and hopefully help- if continues, will need Urology outpt f/u- explained to pt that is an outpt w/u.   2/3- will recheck U/A and Cx- even though last one was (-), I don't have a reason she just stopped voiding- and Cx showed multiple species- will recollect.   2/4: UA appears negative, f/u UC  2/6- Urine Cx was (-) this time- per chart, is voiding now? Will double check with nursing  2/8- voiding in general-  <200cc bladder scans after voiding. But sometimes not drinking, so is dry more.   2/9- voiding -not requiring in/out caths 17. Left hand pain: placed order to ice.  18. Migraines- last had 5 years ago- started 2 days ago  2/8- will give Fioricet prn. 2/9- hasn't had another migraine- but con't - 7 days of Fioricit at d/c.        LOS: 18 days A FACE TO FACE EVALUATION WAS PERFORMED  Tarrin Lebow 03/16/2021, 8:39 AM

## 2021-03-19 ENCOUNTER — Emergency Department: Payer: Medicaid Other

## 2021-03-19 ENCOUNTER — Other Ambulatory Visit: Payer: Self-pay

## 2021-03-19 ENCOUNTER — Emergency Department
Admission: EM | Admit: 2021-03-19 | Discharge: 2021-03-20 | Disposition: A | Discharge status: Home / Self Care | Payer: Medicaid Other | Attending: Emergency Medicine | Admitting: Emergency Medicine

## 2021-03-19 ENCOUNTER — Telehealth (HOSPITAL_COMMUNITY): Payer: Self-pay

## 2021-03-19 DIAGNOSIS — M542 Cervicalgia: Secondary | ICD-10-CM | POA: Insufficient documentation

## 2021-03-19 DIAGNOSIS — E119 Type 2 diabetes mellitus without complications: Secondary | ICD-10-CM | POA: Insufficient documentation

## 2021-03-19 DIAGNOSIS — I10 Essential (primary) hypertension: Secondary | ICD-10-CM | POA: Insufficient documentation

## 2021-03-19 DIAGNOSIS — R079 Chest pain, unspecified: Secondary | ICD-10-CM | POA: Insufficient documentation

## 2021-03-19 LAB — BASIC METABOLIC PANEL
Anion gap: 8 (ref 5–15)
BUN: 12 mg/dL (ref 6–20)
CO2: 22 mmol/L (ref 22–32)
Calcium: 9 mg/dL (ref 8.9–10.3)
Chloride: 106 mmol/L (ref 98–111)
Creatinine, Ser: 0.66 mg/dL (ref 0.44–1.00)
GFR, Estimated: >60 mL/min (ref >60)
Glucose, Bld: 117 mg/dL — ABNORMAL HIGH (ref 70–99)
Potassium: 4.1 mmol/L (ref 3.5–5.1)
Sodium: 136 mmol/L (ref 135–145)

## 2021-03-19 LAB — CBC
HCT: 41.5 % (ref 36.0–46.0)
Hemoglobin: 13.2 g/dL (ref 12.0–15.0)
MCH: 27 pg (ref 26.0–34.0)
MCHC: 31.8 g/dL (ref 30.0–36.0)
MCV: 84.9 fL (ref 80.0–100.0)
Platelets: 438 10*3/uL — ABNORMAL HIGH (ref 150–400)
RBC: 4.89 MIL/uL (ref 3.87–5.11)
RDW: 15.8 % — ABNORMAL HIGH (ref 11.5–15.5)
WBC: 7.7 10*3/uL (ref 4.0–10.5)
nRBC: 0 % (ref 0.0–0.2)

## 2021-03-19 LAB — TROPONIN I (HIGH SENSITIVITY)
Troponin I (High Sensitivity): 15 ng/L (ref <18)
Troponin I (High Sensitivity): 16 ng/L (ref <18)

## 2021-03-19 IMAGING — CR DG CHEST 2V
1 series · 2 of 2 positions shown · non-contrast
Comparison: [DATE]

CLINICAL DATA: Chest pain

EXAM:
CHEST - 2 VIEW

[Series 1: dg chest 2 view · 0.14mm/px · 2 of 2 slices shown]
[im 1/2]
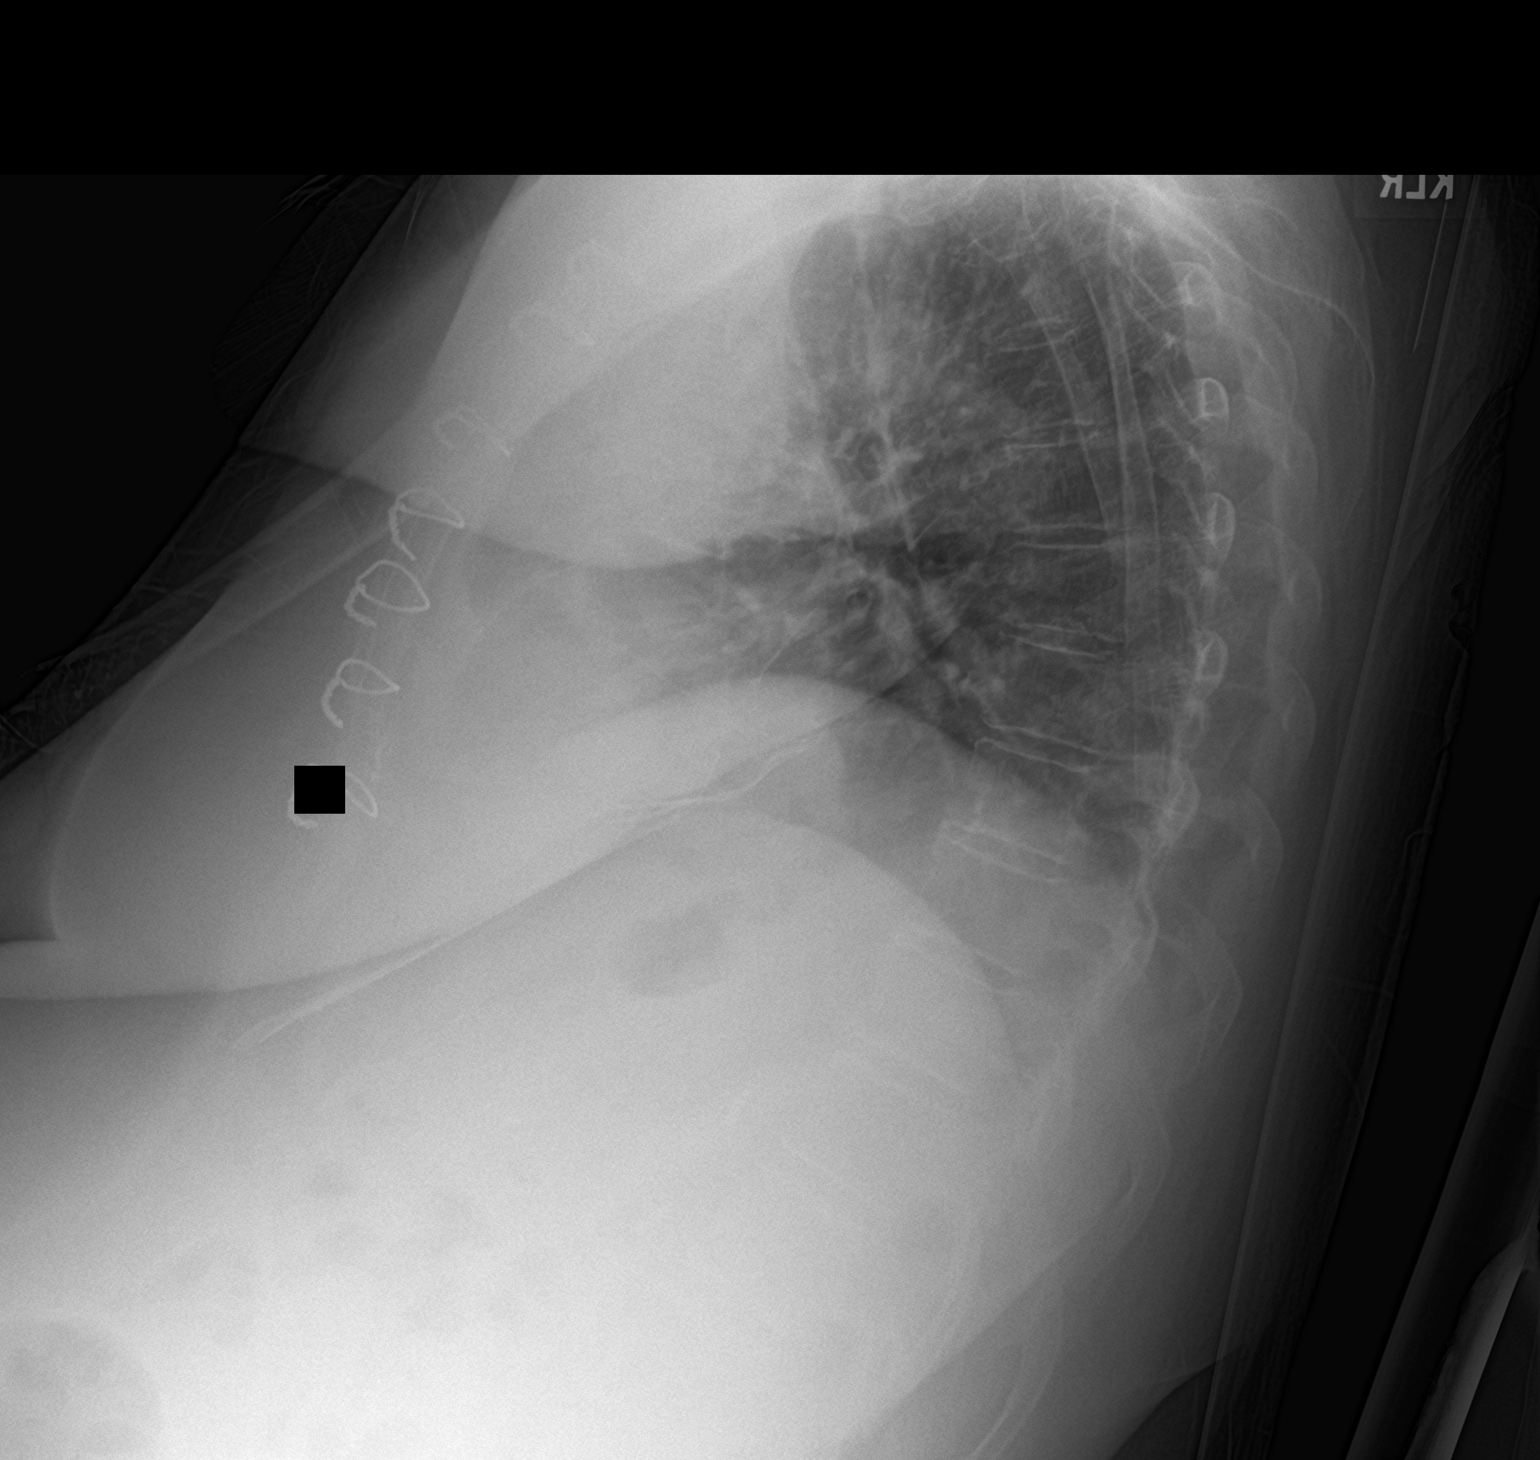
[im 2/2]
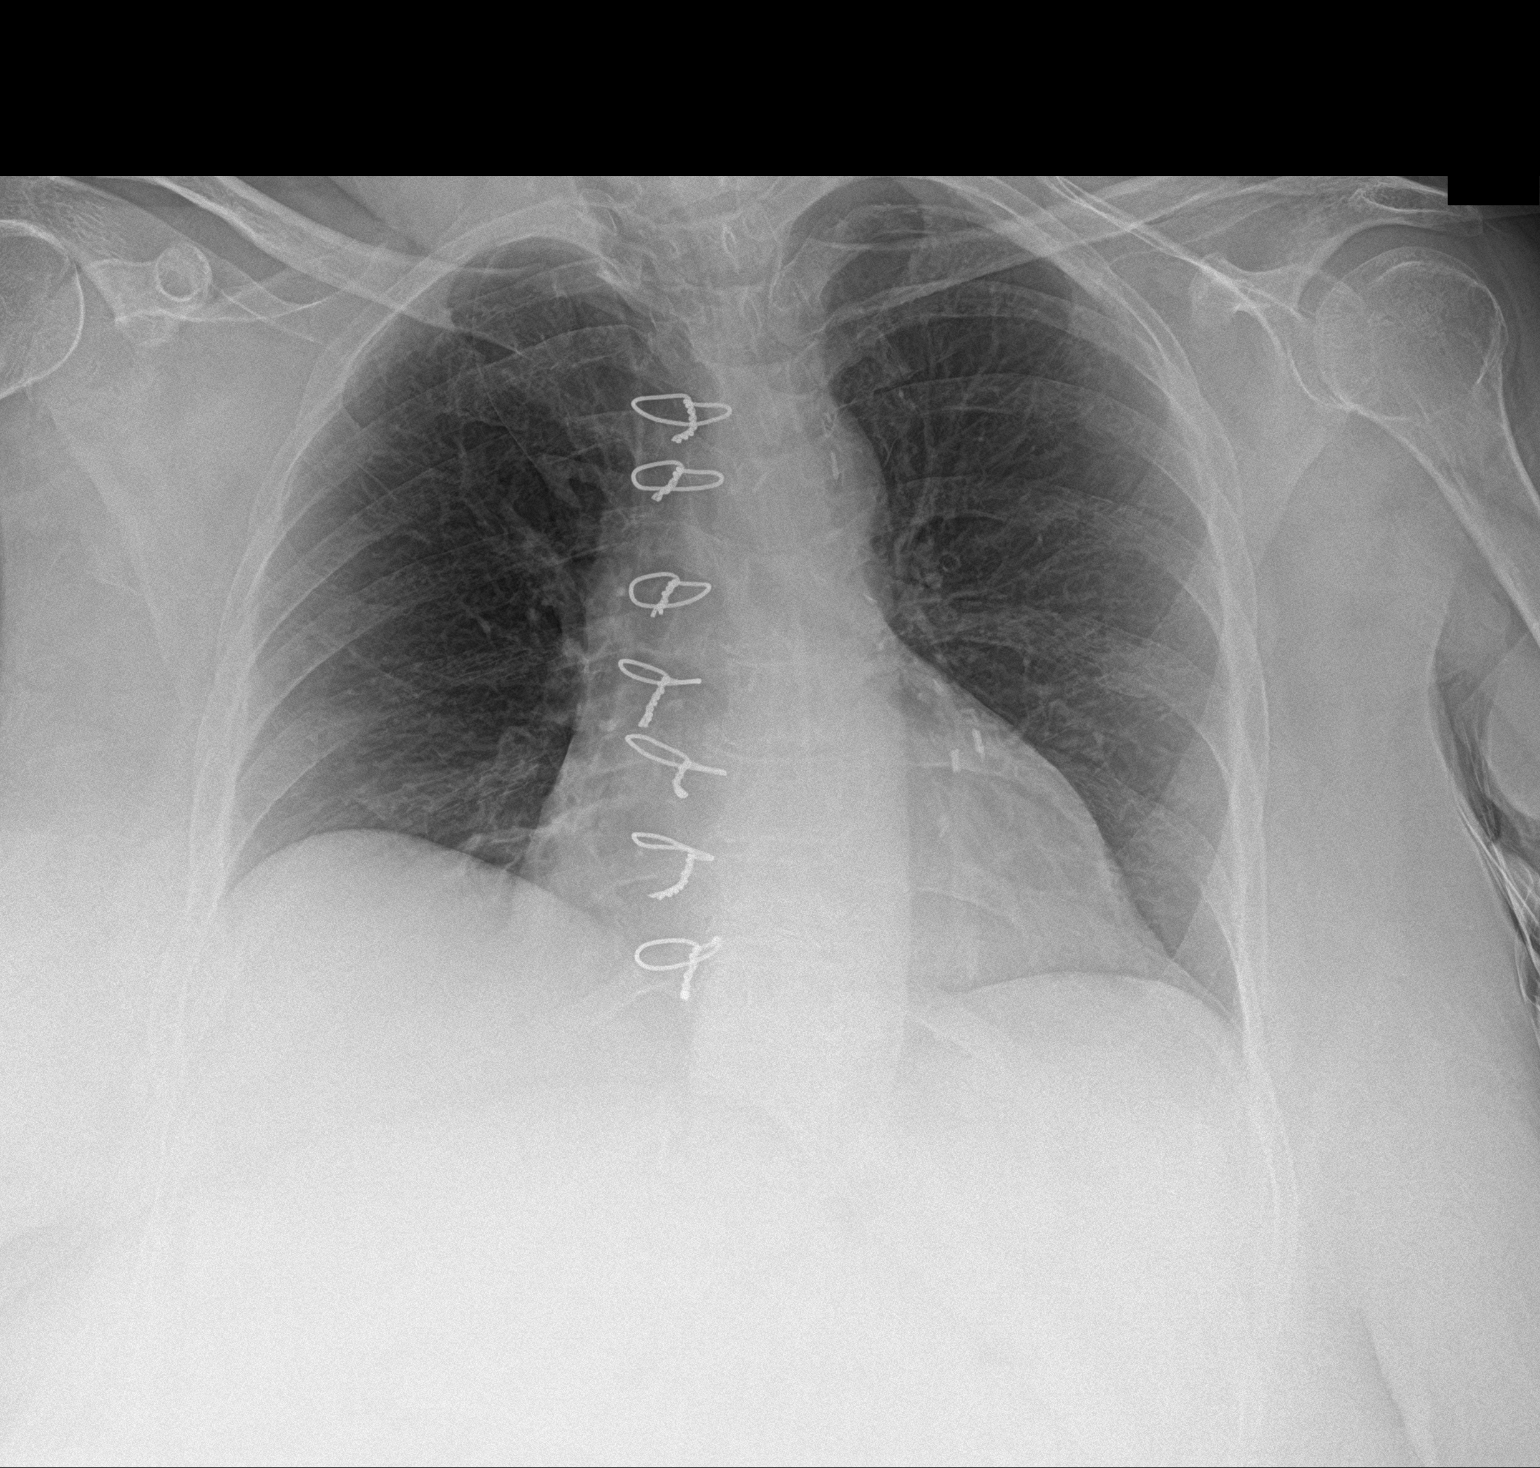

[2 of 2 positions shown; findings below may reference images not displayed]

FINDINGS: Previous coronary bypass changes again noted. Stable heart size and
vascularity. No acute pneumonia, edema, effusion pneumothorax.
Azygous fissure in the right upper lobe, normal variant. Trachea
midline. Minor basilar scarring as before. No acute osseous finding.
IMPRESSION: Previous coronary bypass changes.

Stable exam. No interval change or acute process by plain
radiography.

## 2021-03-19 MED ORDER — NITROGLYCERIN 0.4 MG SL SUBL: 0.4000 mg | SUBLINGUAL_TABLET | SUBLINGUAL | Status: DC | PRN

## 2021-03-19 MED ORDER — ALUM & MAG HYDROXIDE-SIMETH 200-200-20 MG/5ML PO SUSP
30.0000 mL | Freq: Once | ORAL | Status: DC
  Filled 2021-03-19: qty 30

## 2021-03-19 NOTE — ED Notes (Signed)
Pt refused Maalox, stating that this is not gas pain. Dr. Joni Fears aware.

## 2021-03-19 NOTE — ED Notes (Signed)
Pt c/o L sided neck pain, L chest pain earlier today she states felt like when she had her heart attack 4-5 years ago. Pt denies CP at this time, but does endorse L sided neck pain still. Pt denies SHOB.

## 2021-03-19 NOTE — ED Triage Notes (Signed)
Pt to ED [facility] from home for centralized chest pain radiating to left side of neck that started a couple hours ago. Denies shob, n.v Hx multiple strokes, left sided deficits and speech impairment.

## 2021-03-19 NOTE — ED Provider Triage Note (Signed)
Emergency Medicine Provider Triage Evaluation Note  [facility], a 51 y.o. female with a history of stroke and dysarthria, presents to the ED from home via EMS. She with  was evaluated in triage for complaints of central chest pain with radiation into the left side of her neck.  She reports onset about 2 hours prior to arrival.  She denies any associated shortness of breath, nausea or vomiting.    Review of Systems  Positive: CP, neck pain  Negative: NV, SOB  Physical Exam  BP (!) 136/100    Pulse 97    Temp 97.6 F (36.4 C)    Resp 20    Ht 5\' 2"  (1.575 m)    Wt 97 kg    SpO2 100%    BMI 39.11 kg/m  Gen:   Awake, no distress   Resp:  Normal effort  MSK:   Moves extremities without difficulty Right hemi-paresis Other:  CVS: RRR  Medical Decision Making  Medically screening exam initiated at 5:43 PM.  Appropriate orders placed.  Leigh[employer] was informed that the remainder of the evaluation will be completed by another provider, this initial triage assessment does not replace that evaluation, and the importance of remaining in the ED until their evaluation is complete.  Patient with previous stroke and right-sided hemiparesis, presents to the ED for evaluation of central chest pain with associated shortness of breath, with onset 2 hours prior to arrival.   Melvenia Needles, PA-C 03/19/21 1754

## 2021-03-19 NOTE — Discharge Instructions (Addendum)
Your chest x-ray and lab test today were all okay.  Please follow-up with your doctor for further evaluation of your symptoms.  Continue taking all of your home medications.

## 2021-03-19 NOTE — ED Provider Notes (Signed)
[facility] Provider Note    Event Date/Time   First MD Initiated Contact with Patient 03/19/21 2109     (approximate)   History   Chest Pain   HPI  Lori [name] [name] is a 51 y.o. female with a past history of diabetes, hypertension, pheochromocytoma, CVA who comes ED complaining of central chest pain and left neck pain that started about 1:00 PM today.  It is intermittent, lasting a few seconds at a time.  No shortness of breath.  Not pleuritic, not exertional.  No vomiting or diaphoresis.  No fever or cough or difficulty swallowing.     Physical Exam   Triage Vital Signs: ED Triage Vitals [03/19/21 1737]  Enc Vitals Group     BP (!) 136/100     Pulse Rate 97     Resp 20     Temp 97.6 F (36.4 C)     Temp src      SpO2 100 %     Weight 213 lb 13.5 oz (97 kg)     Height 5\' 2"  (1.575 m)     Head Circumference      Peak Flow      Pain Score 8     Pain Loc      Pain Edu?      Excl. in [place]?     Most recent vital signs: Vitals:   03/19/21 1737 03/19/21 1946  BP: (!) 136/100 (!) 126/96  Pulse: 97 (!) 111  Resp: 20 20  Temp: 97.6 F (36.4 C)   SpO2: 100% 91%     General: Awake, no distress.  CV:  Good peripheral perfusion.  Regular rate and rhythm, symmetric pulses Resp:  Normal effort.  Clear to auscultation bilaterally Abd:  No distention.  Soft and nontender Other:  There is some tenderness at the left posterior neck musculature reproducing symptoms.  No chest wall tenderness.  Chronic neurodeficits.   ED Results / Procedures / Treatments   Labs (all labs ordered are listed, but only abnormal results are displayed) Labs Reviewed  BASIC METABOLIC PANEL - Abnormal; Notable for the following components:      Result Value   Glucose, Bld 117 (*)    All other components within normal limits  CBC - Abnormal; Notable for the following components:   RDW 15.8 (*)    Platelets 438 (*)    All other components within normal limits   TROPONIN I (HIGH SENSITIVITY)  TROPONIN I (HIGH SENSITIVITY)     EKG  Interpreted by me Sinus tachycardia rate 105.  Normal axis and intervals.  Poor R wave progression.  Normal ST segments and T waves.  No evidence of right heart strain or acute ischemia.   RADIOLOGY Chest x-ray viewed and interpreted by me, appears unremarkable.  Radiology report reviewed    PROCEDURES:  Critical Care performed: No  Procedures   MEDICATIONS ORDERED IN ED: Medications  alum & mag hydroxide-simeth (MAALOX/MYLANTA) 200-200-20 MG/5ML suspension 30 mL (30 mLs Oral Patient Refused/Not Given 03/19/21 2140)     IMPRESSION / MDM / ASSESSMENT AND PLAN / ED COURSE  I reviewed the triage vital signs and the nursing notes.                              Differential diagnosis includes, but is not limited to, pneumothorax, pneumonia, non-STEMI, electrolyte abnormality, GERD, musculoskeletal pain    Patient presents with atypical,  noncardiac chest pain involving reproducible neck pain.  Suspect this is most likely muscular.  Patient not requiring admission since work-up including EKG chest x-ray and serum labs is unremarkable and HPI is not concerning for cardiac cause.  Offered Maalox as a trial but patient declines.  Given the reassuring work-up I think she can follow-up with primary care for further evaluation.  We will request ambulance transport home due to her physical disability     FINAL CLINICAL IMPRESSION(S) / ED DIAGNOSES   Final diagnoses:  Neck pain     Rx / DC Orders   ED Discharge Orders     None        Note:  This document was prepared using Dragon voice recognition software and may include unintentional dictation errors.   [name], MD 03/19/21 [phone]

## 2021-03-19 NOTE — ED Notes (Signed)
Dr. Joni Fears at bedside, speaking to pt.

## 2021-03-20 ENCOUNTER — Other Ambulatory Visit (HOSPITAL_COMMUNITY): Payer: Self-pay

## 2021-03-20 NOTE — Telephone Encounter (Addendum)
Made first attempt to call patient on 03/19/2021 with no success. Will plan for 2nd attempt on 03/20/2021.  2nd attempt made on 03/20/2021 with no success. Will plan for 3rd attempt.  2/16: 3rd attempt made with no response, max amount of calls made.

## 2021-04-08 ENCOUNTER — Other Ambulatory Visit: Payer: Self-pay

## 2021-04-08 ENCOUNTER — Emergency Department (HOSPITAL_COMMUNITY)
Admission: EM | Admit: 2021-04-08 | Discharge: 2021-04-09 | Disposition: A | Discharge status: Home / Self Care | Payer: No Typology Code available for payment source | Attending: Emergency Medicine | Admitting: Emergency Medicine

## 2021-04-08 ENCOUNTER — Encounter (HOSPITAL_COMMUNITY): Payer: Self-pay

## 2021-04-08 DIAGNOSIS — F329 Major depressive disorder, single episode, unspecified: Secondary | ICD-10-CM | POA: Insufficient documentation

## 2021-04-08 DIAGNOSIS — Z794 Long term (current) use of insulin: Secondary | ICD-10-CM | POA: Insufficient documentation

## 2021-04-08 DIAGNOSIS — R45851 Suicidal ideations: Secondary | ICD-10-CM

## 2021-04-08 DIAGNOSIS — I10 Essential (primary) hypertension: Secondary | ICD-10-CM | POA: Diagnosis not present

## 2021-04-08 DIAGNOSIS — I69319 Unspecified symptoms and signs involving cognitive functions following cerebral infarction: Secondary | ICD-10-CM

## 2021-04-08 DIAGNOSIS — I69915 Cognitive social or emotional deficit following unspecified cerebrovascular disease: Secondary | ICD-10-CM | POA: Diagnosis not present

## 2021-04-08 DIAGNOSIS — F32A Depression, unspecified: Secondary | ICD-10-CM

## 2021-04-08 DIAGNOSIS — E119 Type 2 diabetes mellitus without complications: Secondary | ICD-10-CM | POA: Insufficient documentation

## 2021-04-08 DIAGNOSIS — Z20822 Contact with and (suspected) exposure to covid-19: Secondary | ICD-10-CM | POA: Insufficient documentation

## 2021-04-08 DIAGNOSIS — F332 Major depressive disorder, recurrent severe without psychotic features: Secondary | ICD-10-CM

## 2021-04-08 DIAGNOSIS — N3000 Acute cystitis without hematuria: Secondary | ICD-10-CM

## 2021-04-08 LAB — CBC
HCT: 39.9 % (ref 36.0–46.0)
Hemoglobin: 13.2 g/dL (ref 12.0–15.0)
MCH: 27 pg (ref 26.0–34.0)
MCHC: 33.1 g/dL (ref 30.0–36.0)
MCV: 81.6 fL (ref 80.0–100.0)
Platelets: 439 10*3/uL — ABNORMAL HIGH (ref 150–400)
RBC: 4.89 MIL/uL (ref 3.87–5.11)
RDW: 14.6 % (ref 11.5–15.5)
WBC: 6.7 10*3/uL (ref 4.0–10.5)
nRBC: 0 % (ref 0.0–0.2)

## 2021-04-08 LAB — CBG MONITORING, ED
Glucose-Capillary: 201 mg/dL — ABNORMAL HIGH (ref 70–99)
Glucose-Capillary: 150 mg/dL — ABNORMAL HIGH (ref 70–99)
Glucose-Capillary: 176 mg/dL — ABNORMAL HIGH (ref 70–99)

## 2021-04-08 LAB — SALICYLATE LEVEL: Salicylate Lvl: <7 mg/dL — ABNORMAL LOW (ref 7.0–30.0)

## 2021-04-08 LAB — COMPREHENSIVE METABOLIC PANEL
ALT: 18 U/L (ref 0–44)
AST: 18 U/L (ref 15–41)
Albumin: 3.5 g/dL (ref 3.5–5.0)
Alkaline Phosphatase: 96 U/L (ref 38–126)
Anion gap: 12 (ref 5–15)
BUN: 8 mg/dL (ref 6–20)
CO2: 22 mmol/L (ref 22–32)
Calcium: 8.5 mg/dL — ABNORMAL LOW (ref 8.9–10.3)
Chloride: 101 mmol/L (ref 98–111)
Creatinine, Ser: 0.52 mg/dL (ref 0.44–1.00)
GFR, Estimated: >60 mL/min (ref >60)
Glucose, Bld: 210 mg/dL — ABNORMAL HIGH (ref 70–99)
Potassium: 3.4 mmol/L — ABNORMAL LOW (ref 3.5–5.1)
Sodium: 135 mmol/L (ref 135–145)
Total Bilirubin: 0.2 mg/dL — ABNORMAL LOW (ref 0.3–1.2)
Total Protein: 7.3 g/dL (ref 6.5–8.1)

## 2021-04-08 LAB — RESP PANEL BY RT-PCR (FLU A&B, COVID) ARPGX2
Influenza A by PCR: NEGATIVE
Influenza B by PCR: NEGATIVE
SARS Coronavirus 2 by RT PCR: NEGATIVE

## 2021-04-08 LAB — ACETAMINOPHEN LEVEL: Acetaminophen (Tylenol), Serum: <10 ug/mL — ABNORMAL LOW (ref 10–30)

## 2021-04-08 LAB — HCG, QUANTITATIVE, PREGNANCY: hCG, Beta Chain, Quant, S: 2 m[IU]/mL (ref <5)

## 2021-04-08 LAB — ETHANOL: Alcohol, Ethyl (B): <10 mg/dL (ref <10)

## 2021-04-08 MED ORDER — MELATONIN 3 MG PO TABS
9.0000 mg | ORAL_TABLET | Freq: Every day | ORAL | Status: DC
  Administered 2021-04-08: 9 mg via ORAL
  Filled 2021-04-08: qty 3

## 2021-04-08 MED ORDER — ATORVASTATIN CALCIUM 40 MG PO TABS: 80.0000 mg | ORAL_TABLET | Freq: Every day | ORAL | Status: DC

## 2021-04-08 MED ORDER — METOPROLOL TARTRATE 25 MG PO TABS
100.0000 mg | ORAL_TABLET | Freq: Two times a day (BID) | ORAL | Status: DC
  Administered 2021-04-08 – 2021-04-09 (×2): 100 mg via ORAL
  Filled 2021-04-08 (×2): qty 4

## 2021-04-08 MED ORDER — PANTOPRAZOLE SODIUM 40 MG PO TBEC
40.0000 mg | DELAYED_RELEASE_TABLET | Freq: Every day | ORAL | Status: DC
Start: 2021-04-09 — End: 2021-04-09
  Administered 2021-04-09: 40 mg via ORAL
  Filled 2021-04-08: qty 1

## 2021-04-08 MED ORDER — AMLODIPINE BESYLATE 5 MG PO TABS
10.0000 mg | ORAL_TABLET | Freq: Every day | ORAL | Status: DC
  Administered 2021-04-09: 10 mg via ORAL
  Filled 2021-04-08: qty 2

## 2021-04-08 MED ORDER — METOPROLOL TARTRATE 25 MG PO TABS
50.0000 mg | ORAL_TABLET | Freq: Once | ORAL | Status: AC
  Administered 2021-04-08: 50 mg via ORAL
  Filled 2021-04-08: qty 2

## 2021-04-08 MED ORDER — INSULIN GLARGINE-YFGN 100 UNIT/ML ~~LOC~~ SOLN
29.0000 [IU] | Freq: Every day | SUBCUTANEOUS | Status: DC
  Administered 2021-04-08: 29 [IU] via SUBCUTANEOUS
  Filled 2021-04-08 (×2): qty 0.29

## 2021-04-08 MED ORDER — AMLODIPINE BESYLATE 5 MG PO TABS
5.0000 mg | ORAL_TABLET | Freq: Once | ORAL | Status: AC
  Administered 2021-04-08: 5 mg via ORAL
  Filled 2021-04-08: qty 1

## 2021-04-08 MED ORDER — ACETAMINOPHEN 325 MG PO TABS
650.0000 mg | ORAL_TABLET | Freq: Once | ORAL | Status: AC
Start: 2021-04-08 — End: 2021-04-08
  Administered 2021-04-08: 650 mg via ORAL
  Filled 2021-04-08: qty 2

## 2021-04-08 MED ORDER — PANTOPRAZOLE SODIUM 40 MG PO TBEC: 40.0000 mg | DELAYED_RELEASE_TABLET | Freq: Every day | ORAL | Status: DC

## 2021-04-08 MED ORDER — METOCLOPRAMIDE HCL 10 MG PO TABS
10.0000 mg | ORAL_TABLET | Freq: Three times a day (TID) | ORAL | Status: DC
  Administered 2021-04-09 (×2): 10 mg via ORAL
  Filled 2021-04-08 (×2): qty 1

## 2021-04-08 MED ORDER — LORAZEPAM 0.5 MG PO TABS
0.2500 mg | ORAL_TABLET | Freq: Every day | ORAL | Status: DC
  Administered 2021-04-08: 0.25 mg via ORAL
  Filled 2021-04-08: qty 1

## 2021-04-08 MED ORDER — ATORVASTATIN CALCIUM 40 MG PO TABS
80.0000 mg | ORAL_TABLET | Freq: Every day | ORAL | Status: DC
  Administered 2021-04-09: 80 mg via ORAL
  Filled 2021-04-08: qty 2

## 2021-04-08 MED ORDER — INSULIN PEN NEEDLE 32G X 4 MM MISC: 1.0000 "application " | Freq: Four times a day (QID) | Status: DC

## 2021-04-08 MED ORDER — INSULIN ASPART 100 UNIT/ML IJ SOLN
4.0000 [IU] | Freq: Three times a day (TID) | INTRAMUSCULAR | Status: DC
  Filled 2021-04-08: qty 0.04

## 2021-04-08 MED ORDER — METHIMAZOLE 5 MG PO TABS
5.0000 mg | ORAL_TABLET | Freq: Every day | ORAL | Status: DC
  Administered 2021-04-08 – 2021-04-09 (×2): 5 mg via ORAL
  Filled 2021-04-08 (×2): qty 1

## 2021-04-08 MED ORDER — CLOPIDOGREL BISULFATE 75 MG PO TABS
75.0000 mg | ORAL_TABLET | Freq: Every day | ORAL | Status: DC
  Administered 2021-04-09: 75 mg via ORAL
  Filled 2021-04-08: qty 1

## 2021-04-08 MED ORDER — AMLODIPINE BESYLATE 5 MG PO TABS
5.0000 mg | ORAL_TABLET | Freq: Once | ORAL | Status: DC
Start: 2021-04-08 — End: 2021-04-09

## 2021-04-08 MED ORDER — INSULIN GLARGINE 100 UNIT/ML SOLOSTAR PEN: 29.0000 [IU] | PEN_INJECTOR | Freq: Every day | SUBCUTANEOUS | Status: DC

## 2021-04-08 MED ORDER — INSULIN ASPART 100 UNIT/ML FLEXPEN
4.0000 [IU] | PEN_INJECTOR | Freq: Three times a day (TID) | SUBCUTANEOUS | Status: DC
  Filled 2021-04-08: qty 3

## 2021-04-08 NOTE — ED Notes (Addendum)
TTS at bedside.  Visitor at bedside advised of visiting rules for [facility] patients.  Visitor agreed to leave when patient was done with TTS consult.   ? ?

## 2021-04-08 NOTE — ED Notes (Signed)
Patient changed into gown by tech.  ?

## 2021-04-08 NOTE — ED Notes (Signed)
Pt has not peed yet ?

## 2021-04-08 NOTE — ED Triage Notes (Signed)
Pt from home by [facility], called out by PD for suicidal tendencies x2 weeks. Pt states 'I feel like a burden and I don't want to live any more.' Endorses plan to take all her insulin or heart pills, states the only reason she hasn't is 'I don't want to go to hell'. Dc 2 weeks ago to daughter's house, has no support. Expressed interest in going to SNF. ? ?BP 100 palp ?HR 91 ?94% RA ?RR 14 ?

## 2021-04-08 NOTE — ED Provider Notes (Addendum)
[facility] DEPT Provider Note   CSN: [id number] Arrival date & time: 04/08/21  1441     History  Chief Complaint  Patient presents with   Suicidal    Lori [name] is a 51 y.o. female extensive past medical hx significant for prior CVA bedbound at baseline, IDDM, HTN depression here for evaluation of SI and depression.  Patient states depression overall worsening since she had CVA in December however symptoms worse over the last 2 weeks.  She feels like she is a burden to her family.  Was previously living with her daughters however she felt as if "they did not care" and subsequently moved out this weekend.  She is staying with a friend.  She feels like she is a burden to "everyone."  She states she has been sleeping more than normal.  Intermittently does not take her medicines because "I do not care if I live or die."  States she has had suicidal thoughts daily over the last 2 weeks with plan to overdose on her insulin and her blood pressure medication.  She did not take her medications this morning.  She feels like there is nothing else to live for.  Does admit to prior suicide attempt at the age of 26.  She denies any fever, N/V, CP, SOB, cough, abd pain, dysuria, diarrhea, rashes. Does admit to a mild aching headache "because I am stressed." Denies any new weakness aside from baseline weakness to BL LE and RUE.  Released from rehab 2 weeks ago.  Patient does NOT given permission to speak with family, emergency contact or friend that she is living with.  HPI     Home Medications Prior to Admission medications   Medication Sig Start Date End Date Taking? Authorizing Provider  acetaminophen (TYLENOL) 325 MG tablet Take 2 tablets (650 mg total) by mouth every 6 (six) hours as needed for mild pain or headache. 02/26/21  Yes Pokhrel, Laxman, MD  amLODipine (NORVASC) 5 MG tablet Take 1 tablet (5 mg total) by mouth daily. 03/15/21  Yes Love, Ivan Anchors, PA-C   atorvastatin (LIPITOR) 80 MG tablet Take 1 tablet (80 mg total) by mouth daily. 03/15/21  Yes Love, Ivan Anchors, PA-C  butalbital-acetaminophen-caffeine (FIORICET) 50-325-40 MG tablet Take 1 tablet by mouth every 6 (six) hours as needed for migraine. 03/16/21  Yes Love, Ivan Anchors, PA-C  clopidogrel (PLAVIX) 75 MG tablet Take 1 tablet (75 mg total) by mouth daily. 03/15/21  Yes Love, Ivan Anchors, PA-C  diclofenac Sodium (VOLTAREN) 1 % GEL Apply 4 g topically 4 (four) times daily. 03/15/21  Yes Love, Ivan Anchors, PA-C  gabapentin (NEURONTIN) 100 MG capsule Take 1 capsule (100 mg total) by mouth 3 (three) times daily. 03/15/21  Yes Love, Ivan Anchors, PA-C  insulin aspart (NOVOLOG) 100 UNIT/ML FlexPen Inject 4 Units into the skin 3 (three) times daily with meals. 03/15/21  Yes Love, Ivan Anchors, PA-C  insulin glargine (LANTUS) 100 UNIT/ML Solostar Pen Inject 29 Units into the skin at bedtime. 03/15/21  Yes Love, Ivan Anchors, PA-C  loratadine (CLARITIN) 10 MG tablet Take 1 tablet (10 mg total) by mouth daily. 03/15/21  Yes Love, Ivan Anchors, PA-C  LORazepam (ATIVAN) 0.5 MG tablet Take 1/2 tablet (0.25 mg total) by mouth at bedtime. 03/15/21  Yes Love, Ivan Anchors, PA-C  melatonin 3 MG TABS tablet Take 3 tablets (9 mg total) by mouth at bedtime. 03/15/21  Yes Love, Ivan Anchors, PA-C  methimazole (TAPAZOLE) 5 MG tablet  Take 1 tablet (5 mg total) by mouth daily. 03/15/21  Yes Love, Ivan Anchors, PA-C  metoCLOPramide (REGLAN) 10 MG tablet Take 1 tablet (10 mg total) by mouth 3 (three) times daily before meals. 03/15/21  Yes Love, Ivan Anchors, PA-C  metoprolol tartrate (LOPRESSOR) 100 MG tablet Take 1 tablet (100 mg total) by mouth 2 (two) times daily. 03/15/21  Yes Love, Ivan Anchors, PA-C  nystatin (MYCOSTATIN/NYSTOP) powder Apply topically 3 (three) times daily to affected areas 03/15/21  Yes Love, Ivan Anchors, PA-C  pantoprazole (PROTONIX) 40 MG tablet Take 1 tablet (40 mg total) by mouth daily. 03/15/21  Yes Love, Ivan Anchors, PA-C  senna (SENOKOT) 8.6 MG TABS tablet Take 2  tablets (17.2 mg total) by mouth daily AND 1 tablet (8.6 mg total) at bedtime. 03/15/21  Yes Love, Ivan Anchors, PA-C  tamsulosin (FLOMAX) 0.4 MG CAPS capsule Take 1 capsule (0.4 mg total) by mouth daily after supper. 03/16/21  Yes Love, Ivan Anchors, PA-C  blood glucose meter kit and supplies KIT Dispense based on patient and insurance preference. Use up to four times daily as directed. 03/16/21   Love, Ivan Anchors, PA-C  escitalopram (LEXAPRO) 20 MG tablet Take 1 tablet (20 mg total) by mouth daily. Patient not taking: Reported on 04/08/2021 03/15/21   Love, Ivan Anchors, PA-C  Insulin Pen Needle 32G X 4 MM MISC Use to inject insulin 4 times daily as directed. 03/15/21   Love, Ivan Anchors, PA-C  lip balm (CARMEX) ointment Apply topically as needed for lip care. Patient not taking: Reported on 04/08/2021 03/16/21   Love, Ivan Anchors, PA-C  lisinopril (ZESTRIL) 20 MG tablet Take 1 tablet (20 mg total) by mouth daily. Patient not taking: Reported on 04/08/2021 03/15/21   Love, Ivan Anchors, PA-C  polyethylene glycol powder (GLYCOLAX/MIRALAX) 17 GM/SCOOP powder Take 17 g by mouth 2 (two) times daily. Patient not taking: Reported on 04/08/2021 03/15/21   Bary Leriche, PA-C      Allergies    Sumatriptan, Contrast media [iodinated contrast media], and Penicillins    Review of Systems   Review of Systems  Constitutional: Negative.   HENT: Negative.    Respiratory: Negative.    Cardiovascular: Negative.   Gastrointestinal: Negative.   Genitourinary: Negative.   Musculoskeletal: Negative.   Neurological:  Positive for headaches.  Psychiatric/Behavioral:  Positive for sleep disturbance and suicidal ideas.   All other systems reviewed and are negative.  Physical Exam Updated Vital Signs BP (!) 103/57    Pulse 82    Temp 98.8 F (37.1 C) (Oral)    Resp 18    Ht 5' 2"  (1.575 m)    Wt 97 kg    SpO2 99%    BMI 39.11 kg/m  Physical Exam Vitals and nursing note reviewed.  Constitutional:      General: She is not in acute distress.     Appearance: She is well-developed. She is not ill-appearing, toxic-appearing or diaphoretic.  HENT:     Head: Normocephalic and atraumatic.     Nose: Nose normal.     Mouth/Throat:     Mouth: Mucous membranes are moist.  Eyes:     Pupils: Pupils are equal, round, and reactive to light.  Cardiovascular:     Rate and Rhythm: Normal rate.     Pulses: Normal pulses.          Radial pulses are 2+ on the right side and 2+ on the left side.  Dorsalis pedis pulses are 2+ on the right side and 2+ on the left side.     Heart sounds: Normal heart sounds.  Pulmonary:     Effort: Pulmonary effort is normal. No respiratory distress.     Breath sounds: Normal breath sounds and air entry.  Abdominal:     General: Bowel sounds are normal. There is no distension.     Palpations: Abdomen is soft.     Tenderness: There is no abdominal tenderness. There is no guarding or rebound.  Musculoskeletal:     Cervical back: Normal range of motion.     Comments: Contracted left upper extremity, nonmobile bilateral lower extremities.  Decreased range of motion to right upper extremity.  Skin:    General: Skin is warm and dry.     Capillary Refill: Capillary refill takes less than 2 seconds.  Neurological:     Mental Status: She is alert.     Comments: Global weakness, worse to bilateral lower extremities no range of motion was contracted left upper extremity.  Nonambulatory. Dysarthria   Psychiatric:        Attention and Perception: Attention normal.        Mood and Affect: Mood normal. Affect is flat.        Behavior: Behavior is slowed and withdrawn.        Thought Content: Thought content is not paranoid or delusional. Thought content includes suicidal ideation. Thought content does not include homicidal ideation. Thought content includes suicidal plan. Thought content does not include homicidal plan.     Comments: Flat affect, withdrawn behavior.  Admits to SI with plan   ED Results / Procedures /  Treatments   Labs (all labs ordered are listed, but only abnormal results are displayed) Labs Reviewed  COMPREHENSIVE METABOLIC PANEL - Abnormal; Notable for the following components:      Result Value   Potassium 3.4 (*)    Glucose, Bld 210 (*)    Calcium 8.5 (*)    Total Bilirubin 0.2 (*)    All other components within normal limits  SALICYLATE LEVEL - Abnormal; Notable for the following components:   Salicylate Lvl <2.9 (*)    All other components within normal limits  ACETAMINOPHEN LEVEL - Abnormal; Notable for the following components:   Acetaminophen (Tylenol), Serum <10 (*)    All other components within normal limits  CBC - Abnormal; Notable for the following components:   Platelets 439 (*)    All other components within normal limits  CBG MONITORING, ED - Abnormal; Notable for the following components:   Glucose-Capillary 201 (*)    All other components within normal limits  CBG MONITORING, ED - Abnormal; Notable for the following components:   Glucose-Capillary 150 (*)    All other components within normal limits  RESP PANEL BY RT-PCR (FLU A&B, COVID) ARPGX2  ETHANOL  HCG, QUANTITATIVE, PREGNANCY  RAPID URINE DRUG SCREEN, HOSP PERFORMED  URINALYSIS, ROUTINE W REFLEX MICROSCOPIC    EKG None  Radiology No results found.  Procedures Procedures    Medications Ordered in ED Medications  amLODipine (NORVASC) tablet 5 mg (has no administration in time range)  amLODipine (NORVASC) tablet 10 mg (has no administration in time range)  clopidogrel (PLAVIX) tablet 75 mg (has no administration in time range)  LORazepam (ATIVAN) tablet 0.25 mg (has no administration in time range)  melatonin tablet 9 mg (has no administration in time range)  methimazole (TAPAZOLE) tablet 5 mg (has no administration in time  range)  metoCLOPramide (REGLAN) tablet 10 mg (has no administration in time range)  metoprolol tartrate (LOPRESSOR) tablet 100 mg (has no administration in time  range)  insulin glargine-yfgn (SEMGLEE) injection 29 Units (has no administration in time range)  atorvastatin (LIPITOR) tablet 80 mg (has no administration in time range)  pantoprazole (PROTONIX) EC tablet 40 mg (has no administration in time range)  insulin aspart (novoLOG) injection 4 Units (4 Units Subcutaneous Not Given 04/08/21 2003)  acetaminophen (TYLENOL) tablet 650 mg (650 mg Oral Given 04/08/21 1556)  amLODipine (NORVASC) tablet 5 mg (5 mg Oral Given 04/08/21 1556)  metoprolol tartrate (LOPRESSOR) tablet 50 mg (50 mg Oral Given 04/08/21 1556)    ED Course/ Medical Decision Making/ A&P    51 year old with multiple medical comorbidities here for evaluation of depression/ideation.  Unfortunately had large stroke in December, subsequently becoming bedbound, left upper extremity contractures and right upper extremity weakness with dysarthria.  Patient was admitted at that time, discharged to rehab.  After discharge was sent to the lab with her daughter's, patient's depression has been worsening since her stroke.  Has had suicidal thoughts over the last 2 weeks since being discharged she feels like she is a burden to everyone.  She is very tearful room.  States she no longer wants to live.  She has plan to overdose on her insulin and her oral medication at home.  States overall be better off without her.  She denies any other complaints aside from her depression and states ideation.  Unfortunately history is limited as patient does not give permission to speak with family or friend that she is currently living with.  She is hypertensive here however blood pressure appears similar to when she was seen in the ED previously, recently admitted for DKA.  States she has not taken her medications today.  Labs and imaging personally viewed and interpreted:  CBC without leukocytosis, platelets 439 at baseline CMP potassium 3.4, glucose 338 Ethanol, Salicylate, Acetaminophen WNL COVID/Flu neg UDS pending  will not affect disposition  Patient reassessed. No complaints. Was given her home BP meds. Low suspicion for hypertensive urgency or emergency.  Patient reassessed.  Patient continues have no complaints.  Patient has been medically cleared.  Disposition per psychiatry.  Hold orders placed as well as home medications  Discussed plan with attending, Dr. Langston Masker who agrees with above treatment, plan and disposition                          Medical Decision Making Amount and/or Complexity of Data Reviewed Independent Historian: EMS External Data Reviewed: labs, radiology, ECG and notes. Labs: ordered. Decision-making details documented in ED Course. Radiology: ordered and independent interpretation performed. Decision-making details documented in ED Course. ECG/medicine tests: ordered and independent interpretation performed. Decision-making details documented in ED Course.  Risk OTC drugs. Prescription drug management. Diagnosis or treatment significantly limited by social determinants of health.    ADDEND: Patient evaluated by psychiatry.  Meets inpatient criteria.        Final Clinical Impression(s) / ED Diagnoses Final diagnoses:  Suicidal ideation  Depression, unspecified depression type  Cognitive deficit due to old cerebrovascular accident (CVA)    Rx / DC Orders ED Discharge Orders     None         Tu Shimmel A, PA-C 04/08/21 2150    Wyvonnia Dusky, MD 04/08/21 2154    Satvik Parco A, PA-C 04/08/21 2217    Trifan,  Carola Rhine, MD 04/08/21 [phone]
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
## 2021-04-09 DIAGNOSIS — F332 Major depressive disorder, recurrent severe without psychotic features: Secondary | ICD-10-CM

## 2021-04-09 LAB — RAPID URINE DRUG SCREEN, HOSP PERFORMED
Amphetamines: NOT DETECTED
Barbiturates: POSITIVE — AB
Benzodiazepines: NOT DETECTED
Cocaine: NOT DETECTED
Opiates: NOT DETECTED
Tetrahydrocannabinol: NOT DETECTED

## 2021-04-09 LAB — URINALYSIS, ROUTINE W REFLEX MICROSCOPIC
Bilirubin Urine: NEGATIVE
Glucose, UA: NEGATIVE mg/dL
Hgb urine dipstick: NEGATIVE
Ketones, ur: NEGATIVE mg/dL
Nitrite: POSITIVE — AB
Protein, ur: NEGATIVE mg/dL
Specific Gravity, Urine: 1.017 (ref 1.005–1.030)
WBC, UA: >50 WBC/hpf — ABNORMAL HIGH (ref 0–5)
pH: 5 (ref 5.0–8.0)

## 2021-04-09 LAB — CBG MONITORING, ED: Glucose-Capillary: 177 mg/dL — ABNORMAL HIGH (ref 70–99)

## 2021-04-09 MED ORDER — LIDOCAINE HCL 2 % IJ SOLN
INTRAMUSCULAR | Status: AC
  Administered 2021-04-09: 2.2 mg
  Filled 2021-04-09: qty 20

## 2021-04-09 MED ORDER — CEFTRIAXONE SODIUM 1 G IJ SOLR
1.0000 g | Freq: Once | INTRAMUSCULAR | Status: AC
  Administered 2021-04-09: 1 g via INTRAMUSCULAR
  Filled 2021-04-09: qty 10

## 2021-04-09 MED ORDER — ENSURE ENLIVE PO LIQD
237.0000 mL | Freq: Two times a day (BID) | ORAL | Status: DC
  Administered 2021-04-09: 237 mL via ORAL
  Filled 2021-04-09: qty 237

## 2021-04-09 MED ORDER — CEPHALEXIN 500 MG PO CAPS: 500.0000 mg | ORAL_CAPSULE | Freq: Four times a day (QID) | ORAL | 0 refills | Status: DC

## 2021-04-09 MED ORDER — SODIUM CHLORIDE 0.9 % IV SOLN: 1.0000 g | Freq: Once | INTRAVENOUS | Status: DC

## 2021-04-09 NOTE — Consult Note (Signed)
[facility] Psych ED Progress Note  04/09/2021 12:26 PM Lori [name]  MRN:  [id number]  Method of visit?: Face to Face   Subjective: Patient is seen and reassessed by psychiatric nurse practitioner, case discussed with Dr. Dwyane Dee.  Patient is a 51 year old married female, who recently relocated from [facility] about 3 weeks ago.  Patient states she moved to this area to live with her children, however since moving she has been " complete burden.  I am too much for my family to manage."  Patient does endorse passive suicidal ideations due to her inability to care for herself, and being a burden to the family.  She does identified some worsening depressive symptoms.  She identifies depressive symptoms that include tearfulness, decreased concentration and energy, hopelessness, worthlessness, and withdrawn.  Patient is prescribed Lexapro by her primary care physician.  Patient reports compliance with this medication.  She denies having any additional outpatient psychiatric services in the [place] area, however shows interest in some.  When asking patient what can be done for her she pleads" please do not send me home.  I cannot cook for myself.  I cannot take care of myself.  It is very hard living, without assistance.  I cannot walk, and I need assistance with most activities.  I do not like feeling like this."  Patient reports recently completing physical therapy at a rehab facility, and has been unable to care for herself since then. She denies current homicidal ideation or history of aggression. She denies auditory or visual hallucinations. She denies alcohol or other substance use.  Patient is alert and oriented x4, speaks with slight dysarthria although it is comprehensible and is very articulate.  Patient's mood is depressed and anxious, her affect is congruent with her mood.  Her thought process is coherent and relevant.  She does not appear to be responding to internal stimuli,  external stimuli, and or experiencing delusional thought content.  Patient denies any active suicidal ideations, homicidal ideations, and or auditory or visual hallucinations.  Patient is able to contract for safety.  Ultimately patient's in goal is nursing home placement, as her needs far exceed her ability to take care of herself at home.  Patient also feels as though she is a burden to her family, and is requesting assistance with placement. Patient is open to any placement whether it is inpatient psychiatric facility, long-term acute care, Nursing home, or skilled facility.   Principal Problem: MDD (major depressive disorder), recurrent episode, severe ([place]) Diagnosis:  Principal Problem:   MDD (major depressive disorder), recurrent episode, severe ([place])  Total Time spent with patient: 30 minutes  Past Psychiatric History:   Past Medical History:  Past Medical History:  Diagnosis Date   CAD (coronary artery disease)    Depression    Diabetes mellitus without complication ([place])    Hypertension    Pheochromocytoma    Pheochromocytoma    Stroke ([place])    Thyroid disease     Past Surgical History:  Procedure Laterality Date   CORONARY ARTERY BYPASS GRAFT     Right adrenal gland removal for pheochromocytoma Right    Family History:  Family History  Problem Relation Age of Onset   Diabetes Mother    Diabetes Brother    Brain cancer Brother    Family Psychiatric  History: Denies Social History:  Social History   Substance and Sexual Activity  Alcohol Use Not Currently     Social History   Substance and Sexual  Activity  Drug Use Never    Social History   Socioeconomic History   Marital status: Divorced    Spouse name: Not on file   Number of children: Not on file   Years of education: Not on file   Highest education level: Not on file  Occupational History   Not on file  Tobacco Use   Smoking status: Never   Smokeless tobacco: Never  Substance and Sexual Activity    Alcohol use: Not Currently   Drug use: Never   Sexual activity: Not on file  Other Topics Concern   Not on file  Social History Narrative   ** Merged History Encounter **       Social Determinants of Health   Financial Resource Strain: Not on file  Food Insecurity: Not on file  Transportation Needs: Not on file  Physical Activity: Not on file  Stress: Not on file  Social Connections: Not on file    Sleep: Good  Appetite:  Fair  Current Medications: Current Facility-Administered Medications  Medication Dose Route Frequency Provider Last Rate Last Admin   amLODipine (NORVASC) tablet 10 mg  10 mg Oral Daily Henderly, Britni A, PA-C   10 mg at 04/09/21 0912   amLODipine (NORVASC) tablet 5 mg  5 mg Oral Once Henderly, Britni A, PA-C       atorvastatin (LIPITOR) tablet 80 mg  80 mg Oral Daily Wyvonnia Dusky, MD   80 mg at 04/09/21 0911   clopidogrel (PLAVIX) tablet 75 mg  75 mg Oral Daily Henderly, Britni A, PA-C   75 mg at 04/09/21 0912   insulin aspart (novoLOG) injection 4 Units  4 Units Subcutaneous TID WC Wyvonnia Dusky, MD       insulin glargine-yfgn [facility]) injection 29 Units  29 Units Subcutaneous QHS Wyvonnia Dusky, MD   29 Units at 04/08/21 2258   LORazepam (ATIVAN) tablet 0.25 mg  0.25 mg Oral QHS Henderly, Britni A, PA-C   0.25 mg at 04/08/21 2213   melatonin tablet 9 mg  9 mg Oral QHS Henderly, Britni A, PA-C   9 mg at 04/08/21 2213   methimazole (TAPAZOLE) tablet 5 mg  5 mg Oral Daily Henderly, Britni A, PA-C   5 mg at 04/09/21 0912   metoCLOPramide (REGLAN) tablet 10 mg  10 mg Oral TID AC Henderly, Britni A, PA-C   10 mg at 04/09/21 0719   metoprolol tartrate (LOPRESSOR) tablet 100 mg  100 mg Oral BID Henderly, Britni A, PA-C   100 mg at 04/09/21 0911   pantoprazole (PROTONIX) EC tablet 40 mg  40 mg Oral Daily Wyvonnia Dusky, MD   40 mg at 04/09/21 0911   Current Outpatient Medications  Medication Sig Dispense Refill   acetaminophen (TYLENOL) 325 MG  tablet Take 2 tablets (650 mg total) by mouth every 6 (six) hours as needed for mild pain or headache.     amLODipine (NORVASC) 5 MG tablet Take 1 tablet (5 mg total) by mouth daily. 30 tablet 0   atorvastatin (LIPITOR) 80 MG tablet Take 1 tablet (80 mg total) by mouth daily. 30 tablet 0   butalbital-acetaminophen-caffeine (FIORICET) 50-325-40 MG tablet Take 1 tablet by mouth every 6 (six) hours as needed for migraine. 14 tablet 0   clopidogrel (PLAVIX) 75 MG tablet Take 1 tablet (75 mg total) by mouth daily. 30 tablet 0   diclofenac Sodium (VOLTAREN) 1 % GEL Apply 4 g topically 4 (four) times daily. 200 g  0   gabapentin (NEURONTIN) 100 MG capsule Take 1 capsule (100 mg total) by mouth 3 (three) times daily. 90 capsule 0   insulin aspart (NOVOLOG) 100 UNIT/ML FlexPen Inject 4 Units into the skin 3 (three) times daily with meals. 15 mL 0   insulin glargine (LANTUS) 100 UNIT/ML Solostar Pen Inject 29 Units into the skin at bedtime. 15 mL 0   loratadine (CLARITIN) 10 MG tablet Take 1 tablet (10 mg total) by mouth daily. 30 tablet 0   LORazepam (ATIVAN) 0.5 MG tablet Take 1/2 tablet (0.25 mg total) by mouth at bedtime. 15 tablet 0   melatonin 3 MG TABS tablet Take 3 tablets (9 mg total) by mouth at bedtime. 90 tablet 0   methimazole (TAPAZOLE) 5 MG tablet Take 1 tablet (5 mg total) by mouth daily. 30 tablet 0   metoCLOPramide (REGLAN) 10 MG tablet Take 1 tablet (10 mg total) by mouth 3 (three) times daily before meals. 90 tablet 0   metoprolol tartrate (LOPRESSOR) 100 MG tablet Take 1 tablet (100 mg total) by mouth 2 (two) times daily. 60 tablet 0   nystatin (MYCOSTATIN/NYSTOP) powder Apply topically 3 (three) times daily to affected areas 30 g 0   pantoprazole (PROTONIX) 40 MG tablet Take 1 tablet (40 mg total) by mouth daily. 30 tablet 0   senna (SENOKOT) 8.6 MG TABS tablet Take 2 tablets (17.2 mg total) by mouth daily AND 1 tablet (8.6 mg total) at bedtime. 90 tablet 0   tamsulosin (FLOMAX) 0.4 MG  CAPS capsule Take 1 capsule (0.4 mg total) by mouth daily after supper. 30 capsule 0   blood glucose meter kit and supplies KIT Dispense based on patient and insurance preference. Use up to four times daily as directed. 1 each 0   escitalopram (LEXAPRO) 20 MG tablet Take 1 tablet (20 mg total) by mouth daily. (Patient not taking: Reported on 04/08/2021) 30 tablet 0   Insulin Pen Needle 32G X 4 MM MISC Use to inject insulin 4 times daily as directed. 200 each 0   lip balm (CARMEX) ointment Apply topically as needed for lip care. (Patient not taking: Reported on 04/08/2021) 7 g 0   lisinopril (ZESTRIL) 20 MG tablet Take 1 tablet (20 mg total) by mouth daily. (Patient not taking: Reported on 04/08/2021) 30 tablet 0   polyethylene glycol powder (GLYCOLAX/MIRALAX) 17 GM/SCOOP powder Take 17 g by mouth 2 (two) times daily. (Patient not taking: Reported on 04/08/2021) 476 g 0    Lab Results:  Results for orders placed or performed during the hospital encounter of 04/08/21 (from the past 48 hour(s))  Comprehensive metabolic panel     Status: Abnormal   Collection Time: 04/08/21  3:40 PM  Result Value Ref Range   Sodium 135 135 - 145 mmol/L   Potassium 3.4 (L) 3.5 - 5.1 mmol/L   Chloride 101 98 - 111 mmol/L   CO2 22 22 - 32 mmol/L   Glucose, Bld 210 (H) 70 - 99 mg/dL    Comment: Glucose reference range applies only to samples taken after fasting for at least 8 hours.   BUN 8 6 - 20 mg/dL   Creatinine, Ser 0.52 0.44 - 1.00 mg/dL   Calcium 8.5 (L) 8.9 - 10.3 mg/dL   Total Protein 7.3 6.5 - 8.1 g/dL   Albumin 3.5 3.5 - 5.0 g/dL   AST 18 15 - 41 U/L   ALT 18 0 - 44 U/L   Alkaline Phosphatase 96 38 -  126 U/L   Total Bilirubin 0.2 (L) 0.3 - 1.2 mg/dL   GFR, Estimated >60 >60 mL/min    Comment: (NOTE) Calculated using the CKD-EPI Creatinine Equation (2021)    Anion gap 12 5 - 15    Comment: Performed at [facility], [place] [address] [place], [place] [address]  Ethanol     Status: None    Collection Time: 04/08/21  3:40 PM  Result Value Ref Range   Alcohol, Ethyl (B) <10 <10 mg/dL    Comment: (NOTE) Lowest detectable limit for serum alcohol is 10 mg/dL.  For medical purposes only. Performed at [facility], [place] [address] [place], [place] [address]   Salicylate level     Status: Abnormal   Collection Time: 04/08/21  3:40 PM  Result Value Ref Range   Salicylate Lvl <3.0 (L) 7.0 - 30.0 mg/dL    Comment: Performed at [facility], [place] [address], [place], [place] [address]  Acetaminophen level     Status: Abnormal   Collection Time: 04/08/21  3:40 PM  Result Value Ref Range   Acetaminophen (Tylenol), Serum <10 (L) 10 - 30 ug/mL    Comment: (NOTE) Therapeutic concentrations vary significantly. A range of 10-30 ug/mL  may be an effective concentration for many patients. However, some  are best treated at concentrations outside of this range. Acetaminophen concentrations >150 ug/mL at 4 hours after ingestion  and >50 ug/mL at 12 hours after ingestion are often associated with  toxic reactions.  Performed at [facility], [place] [address], [place], [place] [address]   cbc     Status: Abnormal   Collection Time: 04/08/21  3:40 PM  Result Value Ref Range   WBC 6.7 4.0 - 10.5 K/uL   RBC 4.89 3.87 - 5.11 MIL/uL   Hemoglobin 13.2 12.0 - 15.0 g/dL   HCT 39.9 36.0 - 46.0 %   MCV 81.6 80.0 - 100.0 fL   MCH 27.0 26.0 - 34.0 pg   MCHC 33.1 30.0 - 36.0 g/dL   RDW 14.6 11.5 - 15.5 %   Platelets 439 (H) 150 - 400 K/uL   nRBC 0.0 0.0 - 0.2 %    Comment: Performed at [facility], [place] [address], [place], [place] [address]  hCG, quantitative, pregnancy     Status: None   Collection Time: 04/08/21  3:40 PM  Result Value Ref Range   hCG, Beta Chain, Quant, S 2 <5 mIU/mL    Comment:          GEST. AGE      CONC.  (mIU/mL)   <=1 WEEK        5 - 50     2 WEEKS       50 - 500     3 WEEKS       100 -  10,000     4 WEEKS     1,000 - 30,000     5 WEEKS     3,500 - 115,000   6-8 WEEKS     12,000 - 270,000    12 WEEKS     15,000 - 220,000        FEMALE AND NON-PREGNANT FEMALE:     LESS THAN 5 mIU/mL Performed at [facility], [place] [address] [place], [place] [address]   Resp Panel by RT-PCR (Flu A&B, Covid) Nasopharyngeal Swab     Status: None   Collection Time: 04/08/21  3:41 PM   Specimen: Nasopharyngeal Swab; Nasopharyngeal(NP) swabs in vial transport medium  Result Value Ref Range   SARS Coronavirus 2 by RT PCR NEGATIVE NEGATIVE    Comment: (NOTE) SARS-CoV-2 target nucleic acids are NOT DETECTED.  The SARS-CoV-2 RNA is generally detectable in upper respiratory specimens during the acute phase of infection. The lowest concentration of SARS-CoV-2 viral copies this assay can detect is 138 copies/mL. A negative result does not preclude SARS-Cov-2 infection and should not be used as the sole basis for treatment or other patient management decisions. A negative result may occur with  improper specimen collection/handling, submission of specimen other than nasopharyngeal swab, presence of viral mutation(s) within the areas targeted by this assay, and inadequate number of viral copies(<138 copies/mL). A negative result must be combined with clinical observations, patient history, and epidemiological information. The expected result is Negative.  Fact Sheet for Patients:  EntrepreneurPulse.com.au  Fact Sheet for Healthcare Providers:  IncredibleEmployment.be  This test is no t yet approved or cleared by the [place] FDA and  has been authorized for detection and/or diagnosis of SARS-CoV-2 by FDA under an Emergency Use Authorization (EUA). This EUA will remain  in effect (meaning this test can be used) for the duration of the COVID-19 declaration under Section 564(b)(1) of the Act, 21 U.S.C.section 360bbb-3(b)(1), unless the  authorization is terminated  or revoked sooner.       Influenza A by PCR NEGATIVE NEGATIVE   Influenza B by PCR NEGATIVE NEGATIVE    Comment: (NOTE) The Xpert Xpress SARS-CoV-2/FLU/RSV plus assay is intended as an aid in the diagnosis of influenza from Nasopharyngeal swab specimens and should not be used as a sole basis for treatment. Nasal washings and aspirates are unacceptable for Xpert Xpress SARS-CoV-2/FLU/RSV testing.  Fact Sheet for Patients: EntrepreneurPulse.com.au  Fact Sheet for Healthcare Providers: IncredibleEmployment.be  This test is not yet approved or cleared by the [place] FDA and has been authorized for detection and/or diagnosis of SARS-CoV-2 by FDA under an Emergency Use Authorization (EUA). This EUA will remain in effect (meaning this test can be used) for the duration of the COVID-19 declaration under Section 564(b)(1) of the Act, 21 U.S.C. section 360bbb-3(b)(1), unless the authorization is terminated or revoked.  Performed at [facility], [place] [address] [place], [place] [address]   POC CBG, ED     Status: Abnormal   Collection Time: 04/08/21  4:00 PM  Result Value Ref Range   Glucose-Capillary 201 (H) 70 - 99 mg/dL    Comment: Glucose reference range applies only to samples taken after fasting for at least 8 hours.  CBG monitoring, ED     Status: Abnormal   Collection Time: 04/08/21  8:01 PM  Result Value Ref Range   Glucose-Capillary 150 (H) 70 - 99 mg/dL    Comment: Glucose reference range applies only to samples taken after fasting for at least 8 hours.   Comment 1 Document in Chart   CBG monitoring, ED     Status: Abnormal   Collection Time: 04/08/21 10:45 PM  Result Value Ref Range   Glucose-Capillary 176 (H) 70 - 99 mg/dL    Comment: Glucose reference range applies only to samples taken after fasting for at least 8 hours.  CBG monitoring, ED     Status: Abnormal   Collection  Time: 04/09/21  8:16 AM  Result Value Ref Range   Glucose-Capillary 177 (H) 70 - 99 mg/dL    Comment: Glucose reference range  applies only to samples taken after fasting for at least 8 hours.  Rapid urine drug screen (hospital performed)     Status: Abnormal   Collection Time: 04/09/21  9:38 AM  Result Value Ref Range   Opiates NONE DETECTED NONE DETECTED   Cocaine NONE DETECTED NONE DETECTED   Benzodiazepines NONE DETECTED NONE DETECTED   Amphetamines NONE DETECTED NONE DETECTED   Tetrahydrocannabinol NONE DETECTED NONE DETECTED   Barbiturates POSITIVE (A) NONE DETECTED    Comment: (NOTE) DRUG SCREEN FOR MEDICAL PURPOSES ONLY.  IF CONFIRMATION IS NEEDED FOR ANY PURPOSE, NOTIFY LAB WITHIN 5 DAYS.  LOWEST DETECTABLE LIMITS FOR URINE DRUG SCREEN Drug Class                     Cutoff (ng/mL) Amphetamine and metabolites    1000 Barbiturate and metabolites    200 Benzodiazepine                 811 Tricyclics and metabolites     300 Opiates and metabolites        300 Cocaine and metabolites        300 THC                            50 Performed at [facility], [place] [address] [place], [place] [address]   Urinalysis, Routine w reflex microscopic     Status: Abnormal   Collection Time: 04/09/21  9:38 AM  Result Value Ref Range   Color, Urine YELLOW YELLOW   APPearance CLOUDY (A) CLEAR   Specific Gravity, Urine 1.017 1.005 - 1.030   pH 5.0 5.0 - 8.0   Glucose, UA NEGATIVE NEGATIVE mg/dL   Hgb urine dipstick NEGATIVE NEGATIVE   Bilirubin Urine NEGATIVE NEGATIVE   Ketones, ur NEGATIVE NEGATIVE mg/dL   Protein, ur NEGATIVE NEGATIVE mg/dL   Nitrite POSITIVE (A) NEGATIVE   Leukocytes,Ua LARGE (A) NEGATIVE   RBC / HPF 6-10 0 - 5 RBC/hpf   WBC, UA >50 (H) 0 - 5 WBC/hpf   Bacteria, UA RARE (A) NONE SEEN   Squamous Epithelial / LPF 6-10 0 - 5    Comment: Performed at [facility], [place] [address], [place], [place] [address]    Blood Alcohol  level:  Lab Results  Component Value Date   ETH <10 04/08/2021    Physical Findings: AIMS:  , ,  ,  ,    CIWA:    COWS:     Musculoskeletal: Strength & Muscle Tone: within normal limits Gait & Station: normal Patient leans: N/A  Psychiatric Specialty Exam:  Presentation  General Appearance: Appropriate for Environment; Casual  Eye Contact:Fair  Speech:Clear and Coherent; Normal Rate  Speech Volume:Normal  Handedness:Right   Mood and Affect  Mood:Depressed; Anxious  Affect:Congruent; Depressed   Thought Process  Thought Processes:Coherent; Linear  Descriptions of Associations:Intact  Orientation:Full (Time, Place and Person)  Thought Content:Logical  History of Schizophrenia/Schizoaffective disorder:No  Duration of Psychotic Symptoms:No data recorded Hallucinations:Hallucinations: None  Ideas of Reference:None  Suicidal Thoughts:Suicidal Thoughts: No  Homicidal Thoughts:Homicidal Thoughts: No   Sensorium  Memory:Immediate Fair; Recent Fair; Remote Fair  Judgment:Fair  Insight:Fair   Executive Functions  Concentration:Fair  Attention Span:Fair  [place]   Psychomotor Activity  Psychomotor Activity:Psychomotor Activity: Normal   Assets  Assets:Communication Skills; Desire for Improvement; Financial Resources/Insurance; Social Support; Resilience; Leisure Time   Sleep  Sleep:Sleep: Fair    Physical Exam: Physical Exam Vitals and nursing note reviewed.  Constitutional:      Appearance: Normal appearance. She is normal weight.  HENT:     Head: Normocephalic.  Skin:    General: Skin is warm and dry.     Capillary Refill: Capillary refill takes less than 2 seconds.  Neurological:     General: No focal deficit present.     Mental Status: She is alert and oriented to person, place, and time. Mental status is at baseline.  Psychiatric:        Mood and Affect: Mood normal.         Behavior: Behavior normal.        Thought Content: Thought content normal.        Judgment: Judgment normal.   ROS Blood pressure (!) 160/85, pulse 80, temperature 98.8 F (37.1 C), temperature source Oral, resp. rate 19, height 5' 2"  (1.575 m), weight 97 kg, SpO2 99 %. Body mass index is 39.11 kg/m.  Treatment Plan Summary: Plan Will increase lexapro 53m po daily to further target symptoms of depression and stress reaction. Patient current symptoms of depression are situational and related to her inability to care for herself. Patient will benefit from a higher level of care, such as LTAC facility.   Will place TOC referral to assist with initiation of this process and discussed other options available to patient during this time while placement is found such as personal care aid or home health. Discussed with patient she would likely need to return home and work with team of providers to get her placed. She voices concerns about relocation to this area and the need for new doctor and care coordinations services. She states she is unable to take care of herself at home.    Will psychiatrically clear at this time.   TSuella Broad FNP 04/09/2021, 12:26 PM

## 2021-04-09 NOTE — Progress Notes (Signed)
.  Transition of Care [facility]) - Emergency Department Mini Assessment ? ? ?Patient Details  ?Name: [facility] ?MRN: [id number] ?Date of Birth: 1970-03-01 ? ?Transition of Care (TOC) CM/SW Contact:    ?Arlie Solomons Graden Hoshino, LCSW ?Phone Number: ?04/09/2021, 4:11 PM ? ? ?Clinical Narrative: ? ?TOC CSW spoke with pt in regard to LTC placement. CSW explained to pt the ED does not place for LTC. CSW explained to pt she would need to follow up with her PCP to assist with placement options. Pt has an appointment on 3/16 with doctor Newlin through [facility]. CSW informed pt about CAP and PCS services as Medicaid is in place. CSW provided contact information to start those referrals. Pt stated she does not have money to pay OOP for [facility] services. CSW explained to pt she will be discharged today, pt stated she will need EMS to help her get into her home. Resources have been attached to pt's AVS. ?  ? ?ED Mini Assessment: ?What brought you to the Emergency Department? : SI ? ?Barriers to Discharge: No Barriers Identified ? ?  ? ?Means of departure: Ambulance ? ?Interventions which prevented an admission or readmission: PCP counseling ? ? ? ?Patient Contact and Communications ?  ?  ?  ? ,     ?  ?  ? ?  ?  ?  ? ?Admission diagnosis:  Suicidal ?Patient Active Problem List  ? Diagnosis Date Noted  ? MDD (major depressive disorder), recurrent episode, severe ([place]) 04/09/2021  ? Diabetic gastroparesis ([place]) 03/16/2021  ? Migraines 03/16/2021  ? History of cerebral infarction   ? Debility 02/26/2021  ? DKA (diabetic ketoacidosis) ([place]) 02/19/2021  ? Constipation   ? Weakness   ? Type 2 diabetes mellitus with hyperlipidemia ([place])   ? Aphasia   ? Spell of abnormal behavior   ? Stroke ([place]) 01/19/2021  ? Tachycardia 01/19/2021  ? CAD (coronary artery disease) 01/19/2021  ? CVA (cerebral vascular accident) ([place]) 01/19/2021  ? Diabetes ([place])   ? Hyperthyroidism   ? Essential hypertension   ? Pheochromocytoma   ?  Depression   ? HLD (hyperlipidemia)   ? ?PCP:  System, Provider Not In ?Pharmacy:   ?[place] [place] ?[address] ?[address] ?[place] [address] ?Phone: [phone] Fax: [phone] ? ?[facility] ?[address] ?[place] [address] ?Phone: [phone] Fax: [phone] ? ?[facility] ?[address] ?[place] [address] ?Phone: [phone] Fax: [phone] ?  ?

## 2021-04-09 NOTE — ED Notes (Signed)
[facility] at bedside to transport pt home 

## 2021-04-09 NOTE — ED Notes (Signed)
Pt NAD, a/ox4. Pt verbalizes understanding of all DC and f/u instructions. All questions answered. Pt moved to [place] and transported home by PTAR. ? ?

## 2021-04-09 NOTE — Discharge Instructions (Signed)
Personal Care Services  ?[place] Medicaid Clinical Section ?Phone: [phone] ?Email: [email or web address] ? ?[facility] [employer] Program for Disabled Adults(CAPS services) ?Referral-  [phone] ? ?Personal Care agencies (out of pocket cost) ?[facility] -[phone] ?[facility] [phone] ?[place] [facility] [phone]  ?

## 2021-04-09 NOTE — ED Notes (Signed)
Per christina christovale SW, pt cleared by psych and TOC to go home ?

## 2021-04-09 NOTE — ED Provider Notes (Signed)
Emergency Medicine Observation Re-evaluation Note ? ?Lori [name] is a 51 y.o. female, seen on rounds today at 0700.  Pt initially presented to the ED for complaints of Suicidal ?Currently, the patient is resting comfortably. ? ?Physical Exam  ?BP (!) 167/103   Pulse 76   Temp 98.8 ?F (37.1 ?C) (Oral)   Resp 15   Ht '5\' 2"'$  (1.575 m)   Wt 97 kg   SpO2 97%   BMI 39.11 kg/m?  ?Physical Exam ?General: NAD ? ? ?ED Course / MDM  ?EKG:  ? ?I have reviewed the labs performed to date as well as medications administered while in observation.  Recent changes in the last 24 hours include no acute events reported. ? ?Plan  ?Current plan is for psych evaluation. ? Lori [name] is not under involuntary commitment. ? ? ?  ?Lori Merino, MD ?04/09/21 [phone] ? ?

## 2021-04-10 ENCOUNTER — Telehealth: Payer: Self-pay

## 2021-04-10 DIAGNOSIS — I639 Cerebral infarction, unspecified: Secondary | ICD-10-CM

## 2021-04-10 NOTE — Telephone Encounter (Signed)
Lori [name], a Education officer, museum from [employer], called to stated that she went to the patient's home today for evaluation. She would like an order for a Education officer, museum to come to the home. Please advise ?

## 2021-04-11 ENCOUNTER — Other Ambulatory Visit (HOSPITAL_COMMUNITY): Payer: Self-pay

## 2021-04-11 ENCOUNTER — Telehealth: Payer: Self-pay | Admitting: *Deleted

## 2021-04-11 NOTE — Telephone Encounter (Signed)
Copied from [place] [id number]. Topic: Appointment Scheduling - Scheduling Inquiry for Clinic >> Apr 11, 2021  1:46 PM Alanda Slim E wrote: Reason for CRM: Pt has a Hosp f/u on 3.16.23 and she is asking if this can be virtual/ please advise asap

## 2021-04-11 NOTE — Telephone Encounter (Signed)
We can order that- thanks- ML

## 2021-04-11 NOTE — Telephone Encounter (Signed)
Pt was called and informed that appointment has been changed to virtual. ?

## 2021-04-16 NOTE — Addendum Note (Signed)
Addended by: Casilda Carls on: 04/16/2021 12:01 PM ? ? Modules accepted: Orders ? ?

## 2021-04-16 NOTE — Telephone Encounter (Signed)
Order for Education officer, museum sent electronically.  ?

## 2021-04-16 NOTE — Telephone Encounter (Signed)
Called Kim from [place] and left voicemail to return call to give verbal order for a social worker ?

## 2021-04-17 NOTE — Telephone Encounter (Signed)
Left voicemail to return call to clinic to give orders for social worker ?

## 2021-04-18 NOTE — Telephone Encounter (Signed)
Left voicemail to return call to clinic as the voicemail is not secure ?

## 2021-04-19 ENCOUNTER — Telehealth: Payer: Self-pay

## 2021-04-19 ENCOUNTER — Telehealth: Payer: Self-pay | Admitting: *Deleted

## 2021-04-19 ENCOUNTER — Other Ambulatory Visit: Payer: Self-pay | Admitting: Family Medicine

## 2021-04-19 ENCOUNTER — Other Ambulatory Visit: Payer: Self-pay

## 2021-04-19 ENCOUNTER — Ambulatory Visit: Payer: Medicaid Other | Attending: Family Medicine | Admitting: Family Medicine

## 2021-04-19 ENCOUNTER — Telehealth: Payer: Self-pay | Admitting: Family Medicine

## 2021-04-19 DIAGNOSIS — E1169 Type 2 diabetes mellitus with other specified complication: Secondary | ICD-10-CM

## 2021-04-19 DIAGNOSIS — E0590 Thyrotoxicosis, unspecified without thyrotoxic crisis or storm: Secondary | ICD-10-CM | POA: Diagnosis not present

## 2021-04-19 DIAGNOSIS — E1159 Type 2 diabetes mellitus with other circulatory complications: Secondary | ICD-10-CM

## 2021-04-19 DIAGNOSIS — I639 Cerebral infarction, unspecified: Secondary | ICD-10-CM | POA: Diagnosis not present

## 2021-04-19 DIAGNOSIS — F32A Depression, unspecified: Secondary | ICD-10-CM

## 2021-04-19 DIAGNOSIS — I152 Hypertension secondary to endocrine disorders: Secondary | ICD-10-CM

## 2021-04-19 DIAGNOSIS — E785 Hyperlipidemia, unspecified: Secondary | ICD-10-CM

## 2021-04-19 DIAGNOSIS — F419 Anxiety disorder, unspecified: Secondary | ICD-10-CM

## 2021-04-19 MED ORDER — ESCITALOPRAM OXALATE 20 MG PO TABS: 20.0000 mg | ORAL_TABLET | Freq: Every day | ORAL | 0 refills | Status: DC

## 2021-04-19 MED ORDER — LISINOPRIL 20 MG PO TABS: 20.0000 mg | ORAL_TABLET | Freq: Every day | ORAL | 3 refills | Status: DC

## 2021-04-19 MED ORDER — MELATONIN 3 MG PO TABS: 9.0000 mg | ORAL_TABLET | Freq: Every day | ORAL | 3 refills | Status: DC

## 2021-04-19 MED ORDER — INSULIN GLARGINE 100 UNIT/ML SOLOSTAR PEN: 29.0000 [IU] | PEN_INJECTOR | Freq: Every day | SUBCUTANEOUS | 0 refills | Status: DC

## 2021-04-19 MED ORDER — TAMSULOSIN HCL 0.4 MG PO CAPS: 0.4000 mg | ORAL_CAPSULE | Freq: Every day | ORAL | 3 refills | Status: DC

## 2021-04-19 MED ORDER — PANTOPRAZOLE SODIUM 40 MG PO TBEC: 40.0000 mg | DELAYED_RELEASE_TABLET | Freq: Every day | ORAL | 3 refills | Status: DC

## 2021-04-19 MED ORDER — HYDROXYZINE HCL 25 MG PO TABS: 25.0000 mg | ORAL_TABLET | Freq: Every evening | ORAL | 3 refills | Status: DC | PRN

## 2021-04-19 MED ORDER — ATORVASTATIN CALCIUM 80 MG PO TABS: 80.0000 mg | ORAL_TABLET | Freq: Every day | ORAL | 0 refills | Status: DC

## 2021-04-19 MED ORDER — INSULIN ASPART 100 UNIT/ML FLEXPEN: 4.0000 [IU] | PEN_INJECTOR | Freq: Three times a day (TID) | SUBCUTANEOUS | 0 refills | Status: DC

## 2021-04-19 MED ORDER — AMLODIPINE BESYLATE 5 MG PO TABS: 5.0000 mg | ORAL_TABLET | Freq: Every day | ORAL | 3 refills | Status: DC

## 2021-04-19 MED ORDER — METOPROLOL TARTRATE 100 MG PO TABS: 100.0000 mg | ORAL_TABLET | Freq: Two times a day (BID) | ORAL | 3 refills | Status: DC

## 2021-04-19 MED ORDER — FREESTYLE LIBRE 2 SENSOR MISC
3 refills | Status: DC
Start: 2021-04-19 — End: 2021-10-22

## 2021-04-19 MED ORDER — FREESTYLE LIBRE 2 READER DEVI: 3 refills | Status: DC

## 2021-04-19 MED ORDER — CLOPIDOGREL BISULFATE 75 MG PO TABS: 75.0000 mg | ORAL_TABLET | Freq: Every day | ORAL | 3 refills | Status: DC

## 2021-04-19 MED ORDER — METHIMAZOLE 5 MG PO TABS: 5.0000 mg | ORAL_TABLET | Freq: Every day | ORAL | 3 refills | Status: DC

## 2021-04-19 MED ORDER — GABAPENTIN 100 MG PO CAPS: 100.0000 mg | ORAL_CAPSULE | Freq: Three times a day (TID) | ORAL | 0 refills | Status: DC

## 2021-04-19 NOTE — Telephone Encounter (Signed)
Rx sent for [employer]. ?

## 2021-04-19 NOTE — Telephone Encounter (Signed)
Requested medication (s) are due for refill today: yes ? ?Requested medication (s) are on the active medication list: yes ? ?Last refill:  04/19/21 #30 0 refills ? ?Future visit scheduled: seen today  ? ?Notes to clinic:  patient requesting 90 day supply . Do you want to give 90 day supply? ? ? ?  ?Requested Prescriptions  ?Pending Prescriptions Disp Refills  ? atorvastatin (LIPITOR) 80 MG tablet [Pharmacy Med Name: ATORVASTATIN '80MG'$  TABLETS] 90 tablet   ?  Sig: TAKE 1 TABLET(80 MG) BY MOUTH DAILY  ?  ? Cardiovascular:  Antilipid - Statins Failed - 04/19/2021  2:11 PM  ?  ?  Failed - Lipid Panel in normal range within the last 12 months  ?  Cholesterol  ?Date Value Ref Range Status  ?01/20/2021 165 0 - 200 mg/dL Final  ? ?LDL Cholesterol  ?Date Value Ref Range Status  ?01/20/2021 95 0 - 99 mg/dL Final  ?  Comment:  ?         ?Total Cholesterol/HDL:CHD Risk ?Coronary Heart Disease Risk Table ?                    Men   Women ? 1/2 Average Risk   3.4   3.3 ? Average Risk       5.0   4.4 ? 2 X Average Risk   9.6   7.1 ? 3 X Average Risk  23.4   11.0 ?       ?Use the calculated Patient Ratio ?above and the CHD Risk Table ?to determine the patient's CHD Risk. ?       ?ATP III CLASSIFICATION (LDL): ? <100     mg/dL   Optimal ? 100-129  mg/dL   Near or Above ?                   Optimal ? 130-159  mg/dL   Borderline ? 160-189  mg/dL   High ? >190     mg/dL   Very High ?Performed at [facility], [address], [place], [place] [address] ?  ? ?HDL  ?Date Value Ref Range Status  ?01/20/2021 54 >40 mg/dL Final  ? ?Triglycerides  ?Date Value Ref Range Status  ?01/20/2021 81 <150 mg/dL Final  ? ?  ?  ?  Passed - Patient is not pregnant  ?  ?  Passed - Valid encounter within last 12 months  ?  Recent Outpatient Visits   ? ?      ? Today Type 2 diabetes mellitus with hyperlipidemia ([place])  ? Petal Charlott Rakes, MD  ? ?  ?  ?Future Appointments   ? ?        ? Tomorrow Ngetich, Nelda Bucks,  NP [facility]  ? In 3 weeks Lovorn, Jinny Blossom, MD [facility], CPR  ? In 1 month Camillia Herter, NP Primary Care at [facility]  ? In 2 months Charlott Rakes, MD [place]  ? ?  ? ?  ?  ?  ? ?

## 2021-04-19 NOTE — Progress Notes (Signed)
? ?Virtual Visit via Telephone Note ? ?I connected with [name], on 04/19/2021 at 12:01 PM by telephone due to the COVID-19 pandemic and verified that I am speaking with the correct person using two identifiers. ?  ?Consent: ?I discussed the limitations, risks, security and privacy concerns of performing an evaluation and management service by telephone and the availability of in person appointments. I also discussed with the patient that there may be a patient responsible charge related to this service. The patient expressed understanding and agreed to proceed. ? ? ?Location of Patient: ?Home ? ?Location of Provider: ?Clinic ? ? ?Persons participating in Telemedicine visit: ?[employer] ?Dr. Margarita Rana ? ? ? ? ?History of Present Illness: ?Lori [name] is a 51 y.o. 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 ?She has had additional hospitalizations in 02/2021 for urosepsis, DKA and Aspiration into airway. ?  ? ?States she needs to be in a Nursing home as she is unable to walk and she is bed ridden, is unable to cook and that is why she had suicidal ideations which led her to presenting to the ED 10 days ago. ?She lives with her husband who works all day and she is home alone. ? ?She has home physical therapy but she needs PCS services.  Her husband helps her put out her medications. ?Appointments with rehab medicine and neurology, next week. ?  ?Random sugar was 220 today.  She has not had hypoglycemic episodes.  Endorses compliance with her insulin. ?Past Medical History:  ?Diagnosis Date  ? CAD (coronary artery disease)   ? Depression   ? Diabetes mellitus without complication ([place])   ? Hypertension   ? Pheochromocytoma   ? Pheochromocytoma   ? Stroke [facility])   ? Thyroid disease   ? ?Allergies   ?Allergen Reactions  ? Sumatriptan Anaphylaxis  ? Contrast Media [Iodinated Contrast Media] Swelling  ? Penicillins Swelling  ?  Mouth swells up and eyes swollen shut  ? ? ?Current Outpatient Medications on File Prior to Visit  ?Medication Sig Dispense Refill  ? acetaminophen (TYLENOL) 325 MG tablet Take 2 tablets (650 mg total) by mouth every 6 (six) hours as needed for mild pain or headache.    ? amLODipine (NORVASC) 5 MG tablet Take 1 tablet (5 mg total) by mouth daily. 30 tablet 0  ? atorvastatin (LIPITOR) 80 MG tablet Take 1 tablet (80 mg total) by mouth daily. 30 tablet 0  ? blood glucose meter kit and supplies KIT Dispense based on patient and insurance preference. Use up to four times daily as directed. 1 each 0  ? butalbital-acetaminophen-caffeine (FIORICET) 50-325-40 MG tablet Take 1 tablet by mouth every 6 (six) hours as needed for migraine. 14 tablet 0  ? cephALEXin (KEFLEX) 500 MG capsule Take 1 capsule (500 mg total) by mouth 4 (four) times daily. 20 capsule 0  ? clopidogrel (PLAVIX) 75 MG tablet Take 1 tablet (75 mg total) by mouth daily. 30 tablet 0  ? diclofenac Sodium (VOLTAREN) 1 % GEL Apply 4 g topically 4 (four) times daily. 200 g 0  ? escitalopram (LEXAPRO) 20 MG tablet Take 1 tablet (20 mg total) by mouth daily. (Patient not taking: Reported on 04/08/2021) 30 tablet 0  ? gabapentin (NEURONTIN) 100 MG capsule Take 1 capsule (100 mg total) by mouth 3 (  three) times daily. 90 capsule 0  ? insulin aspart (NOVOLOG) 100 UNIT/ML FlexPen Inject 4 Units into the skin 3 (three) times daily with meals. 15 mL 0  ? insulin glargine (LANTUS) 100 UNIT/ML Solostar Pen Inject 29 Units into the skin at bedtime. 15 mL 0  ? Insulin Pen Needle 32G X 4 MM MISC Use to inject insulin 4 times daily as directed. 200 each 0  ? lip balm (CARMEX) ointment Apply topically as needed for lip care. (Patient not taking: Reported on 04/08/2021) 7 g 0  ? lisinopril (ZESTRIL) 20 MG tablet Take 1 tablet (20 mg total) by mouth daily.  (Patient not taking: Reported on 04/08/2021) 30 tablet 0  ? loratadine (CLARITIN) 10 MG tablet Take 1 tablet (10 mg total) by mouth daily. 30 tablet 0  ? LORazepam (ATIVAN) 0.5 MG tablet Take 1/2 tablet (0.25 mg total) by mouth at bedtime. 15 tablet 0  ? melatonin 3 MG TABS tablet Take 3 tablets (9 mg total) by mouth at bedtime. 90 tablet 0  ? methimazole (TAPAZOLE) 5 MG tablet Take 1 tablet (5 mg total) by mouth daily. 30 tablet 0  ? metoCLOPramide (REGLAN) 10 MG tablet Take 1 tablet (10 mg total) by mouth 3 (three) times daily before meals. 90 tablet 0  ? metoprolol tartrate (LOPRESSOR) 100 MG tablet Take 1 tablet (100 mg total) by mouth 2 (two) times daily. 60 tablet 0  ? nystatin (MYCOSTATIN/NYSTOP) powder Apply topically 3 (three) times daily to affected areas 30 g 0  ? pantoprazole (PROTONIX) 40 MG tablet Take 1 tablet (40 mg total) by mouth daily. 30 tablet 0  ? polyethylene glycol powder (GLYCOLAX/MIRALAX) 17 GM/SCOOP powder Take 17 g by mouth 2 (two) times daily. (Patient not taking: Reported on 04/08/2021) 476 g 0  ? senna (SENOKOT) 8.6 MG TABS tablet Take 2 tablets (17.2 mg total) by mouth daily AND 1 tablet (8.6 mg total) at bedtime. 90 tablet 0  ? tamsulosin (FLOMAX) 0.4 MG CAPS capsule Take 1 capsule (0.4 mg total) by mouth daily after supper. 30 capsule 0  ? ?No current facility-administered medications on file prior to visit.  ? ? ?ROS: ?See HPI ? ?Observations/Objective: ?Awake, alert, oriented x3 ?Not in acute distress ?Dysphoric mood ? ? ?CMP Latest Ref Rng & Units 04/08/2021 03/19/2021 03/12/2021  ?Glucose 70 - 99 mg/dL 210(H) 117(H) 119(H)  ?BUN 6 - 20 mg/dL _0 ?Creatinine 0.44 - 1.00 mg/dL 0.52 0.66 0.77  ?Sodium 135 - 145 mmol/L 135 136 138  ?Potassium 3.5 - 5.1 mmol/L 3.4(L) 4.1 4.7  ?Chloride 98 - 111 mmol/L 101 106 109  ?CO2 22 - 32 mmol/L 22 22 20(L)  ?Calcium 8.9 - 10.3 mg/dL 8.5(L) 9.0 8.8(L)  ?Total Protein 6.5 - 8.1 g/dL 7.3 - -  ?Total Bilirubin 0.3 - 1.2 mg/dL 0.2(L) - -  ?Alkaline Phos  38 - 126 U/L 96 - -  ?AST 15 - 41 U/L 18 - -  ?ALT 0 - 44 U/L 18 - -  ? ? ?Lipid Panel  ?   ?Component Value Date/Time  ? CHOL 165 01/20/2021 0709  ? TRIG 81 01/20/2021 0709  ? HDL 54 01/20/2021 0709  ? CHOLHDL 3.1 01/20/2021 0709  ? VLDL 16 01/20/2021 0709  ? [place] 95 01/20/2021 0709  ? ? ?Lab Results  ?Component Value Date  ? HGBA1C 9.0 (H) 02/19/2021  ? ? ?Lab Results  ?Component Value Date  ? TSH 0.383 02/19/2021  ? ? ?Assessment and  Plan: ?1. Type 2 diabetes mellitus with hyperlipidemia ([place]) ?Uncontrolled with A1c of 9.0 ?Goal is less than 7.0 ?Refilled medications ?Due for repeat A1c next month after which regimen can be adjusted ?Counseled on Diabetic diet, my plate method, 737 minutes of moderate intensity exercise/week ?Blood sugar logs with fasting goals of 80-120 mg/dl, random of less than 180 and in the event of sugars less than 60 mg/dl or greater than 400 mg/dl encouraged to notify the clinic. ?Advised on the need for annual eye exams, annual foot exams, Pneumonia vaccine. ?- insulin aspart (NOVOLOG) 100 UNIT/ML FlexPen; Inject 4 Units into the skin 3 (three) times daily with meals.  Dispense: 15 mL; Refill: 0 ?- insulin glargine (LANTUS) 100 UNIT/ML Solostar Pen; Inject 29 Units into the skin at bedtime.  Dispense: 15 mL; Refill: 0 ? ?2. Cerebrovascular accident (CVA), unspecified mechanism ([place]) ?With residual aphasia, lower extremity weakness ?Risk factor modification ?Continue Plavix ?Follow-up with the rehab medicine and neurology ?I will have the case manager reach out to her to assist with nursing home placement ?We will work on obtaining PCS services for her ?- clopidogrel (PLAVIX) 75 MG tablet; Take 1 tablet (75 mg total) by mouth daily.  Dispense: 30 tablet; Refill: 3 ? ?3. Hyperthyroidism ?Last thyroid panel was normal ?- methimazole (TAPAZOLE) 5 MG tablet; Take 1 tablet (5 mg total) by mouth daily.  Dispense: 30 tablet; Refill: 3 ? ?4. Anxiety and depression ?Uncontrolled due to  underlying debility and medical conditions ?Continue with medications, hydroxyzine added for anxiety and to assist with insomnia ?Consider psychotherapy if symptoms persist ?- escitalopram (LEXAPRO) 20 MG table

## 2021-04-19 NOTE — Telephone Encounter (Signed)
Call received from patient who stated that she needs a nursing home. She explained that she is bedbound and needs " help with everything." Her husband is not with her all of the time because he works. She has a home health RN 1x/ week and home health PT  2x/week. Explained to her that placement in a SNF will take time and will not happen overnight. I will need to check with the SW from the rehab unit to inquire if discharge to a SNF was explored.  It is noted that the patient had been at [facility] prior to her hospitalization.  ? ?Message sent to Randel Books, LCSW inquiring if there was a discussion about discharging patient to SNF.  ? ? ?

## 2021-04-19 NOTE — Telephone Encounter (Signed)
[name], PTA from [facility] called stating that BP 170/114 P: 116 today before BP med and BP: 150/100 30 mins after BP meds. ?

## 2021-04-19 NOTE — Telephone Encounter (Signed)
Copied from [place] [id number]. Topic: General - Other >> Apr 19, 2021  2:01 PM McGill, Nelva Bush wrote: Reason for CRM: Pt is requesting a call back from [place]. Pt stated Dr. Margarita Rana advised her Opal Sidles would be reaching out today.   Please advice.

## 2021-04-19 NOTE — Telephone Encounter (Signed)
Medication Refill - Medication: FreeStyle Libre  ? ?Pt requesting for Dr.Newlin to possibly send an Rx for a new one.  ? ?Has the patient contacted their pharmacy? No. ?(Agent: If no, request that the patient contact the pharmacy for the refill. If patient does not wish to contact the pharmacy document the reason why and proceed with request.) ? ? ?Preferred Pharmacy (with phone number or street name):  ?[facility], [address]  ?[place] [address]  ?Phone: [phone] Fax: [phone]  ?Hours: Not open 24 hours  ? ?Has the patient been seen for an appointment in the last year OR does the patient have an upcoming appointment? Yes.   ? ?Agent: Please be advised that RX refills may take up to 3 business days. We ask that you follow-up with your pharmacy.  ?

## 2021-04-20 ENCOUNTER — Ambulatory Visit (INDEPENDENT_AMBULATORY_CARE_PROVIDER_SITE_OTHER): Payer: Medicaid Other | Admitting: Family

## 2021-04-20 ENCOUNTER — Telehealth: Payer: Self-pay | Admitting: Family Medicine

## 2021-04-20 ENCOUNTER — Encounter: Payer: Self-pay | Admitting: Family Medicine

## 2021-04-20 ENCOUNTER — Encounter: Payer: Self-pay | Admitting: Family

## 2021-04-20 ENCOUNTER — Telehealth: Payer: Self-pay | Admitting: *Deleted

## 2021-04-20 ENCOUNTER — Other Ambulatory Visit: Payer: Self-pay

## 2021-04-20 VITALS — BP 124/90 | HR 99 | Temp 97.2°F | Resp 18 | Ht 62.0 in

## 2021-04-20 DIAGNOSIS — F5101 Primary insomnia: Secondary | ICD-10-CM

## 2021-04-20 DIAGNOSIS — Z1159 Encounter for screening for other viral diseases: Secondary | ICD-10-CM

## 2021-04-20 DIAGNOSIS — F32A Depression, unspecified: Secondary | ICD-10-CM

## 2021-04-20 DIAGNOSIS — K3184 Gastroparesis: Secondary | ICD-10-CM

## 2021-04-20 DIAGNOSIS — Z7689 Persons encountering health services in other specified circumstances: Secondary | ICD-10-CM

## 2021-04-20 DIAGNOSIS — E1169 Type 2 diabetes mellitus with other specified complication: Secondary | ICD-10-CM

## 2021-04-20 DIAGNOSIS — K219 Gastro-esophageal reflux disease without esophagitis: Secondary | ICD-10-CM

## 2021-04-20 DIAGNOSIS — E0590 Thyrotoxicosis, unspecified without thyrotoxic crisis or storm: Secondary | ICD-10-CM

## 2021-04-20 DIAGNOSIS — E1143 Type 2 diabetes mellitus with diabetic autonomic (poly)neuropathy: Secondary | ICD-10-CM

## 2021-04-20 DIAGNOSIS — F419 Anxiety disorder, unspecified: Secondary | ICD-10-CM

## 2021-04-20 DIAGNOSIS — G43019 Migraine without aura, intractable, without status migrainosus: Secondary | ICD-10-CM

## 2021-04-20 DIAGNOSIS — I152 Hypertension secondary to endocrine disorders: Secondary | ICD-10-CM

## 2021-04-20 DIAGNOSIS — E785 Hyperlipidemia, unspecified: Secondary | ICD-10-CM

## 2021-04-20 DIAGNOSIS — E1159 Type 2 diabetes mellitus with other circulatory complications: Secondary | ICD-10-CM

## 2021-04-20 DIAGNOSIS — K5901 Slow transit constipation: Secondary | ICD-10-CM

## 2021-04-20 MED ORDER — LORAZEPAM 0.5 MG PO TABS: 0.2500 mg | ORAL_TABLET | Freq: Every day | ORAL | 0 refills | Status: DC

## 2021-04-20 MED ORDER — BUTALBITAL-APAP-CAFFEINE 50-325-40 MG PO TABS: 1.0000 | ORAL_TABLET | Freq: Four times a day (QID) | ORAL | 0 refills | Status: DC | PRN

## 2021-04-20 MED ORDER — MELATONIN 3 MG PO TABS: 9.0000 mg | ORAL_TABLET | Freq: Every day | ORAL | 3 refills | Status: DC

## 2021-04-20 NOTE — Telephone Encounter (Signed)
Lori [name] called for POC 1wk1, 2wk4.  Approval given. ?

## 2021-04-20 NOTE — Progress Notes (Signed)
? ?Provider: Marlowe Sax FNP-C  ? ?Margie Urbanowicz, Nelda Bucks, NP ? ?Patient Care Team: ?[name] Hampe, Nelda Bucks, NP as PCP - General (Family Medicine) ? ?Extended Emergency Contact Information ?Primary Emergency Contact: [name],[name] ?Home Phone: [phone] ?Mobile Phone: [phone] ?Relation: Daughter ?Secondary Emergency Contact: [name],[name] ?Mobile Phone: [phone] ?Relation: Spouse ?Preferred language: English ?Interpreter needed? No ? ?Code Status:  Full Code  ?Goals of care: Advanced Directive information ?Advanced Directives 04/20/2021  ?Does Patient Have a Medical Advance Directive? No  ?Would patient like information on creating a medical advance directive? No - Patient declined  ? ? ? ?Chief Complaint  ?Patient presents with  ? Establish Care  ?  New Patient.   ? ? ?HPI:  ?Pt is a 51 y.o. female seen today establish care here at [facility] for medical management of chronic diseases she has medical history of diabetes with peripheral neuropathy, hypertension with hyperlipidemia, history of CVA, hyper thyroidism, anxiety and depression, coronary artery disease, GERD, insomnia, diabetic Gastroparesis among adult condition. ? ?Has Home Health Physical therapy 2/week and OT once per week.Has speech once per week.also has HHNurse once per week. ? ?Type 2 diabetes - states CBG in the 200's sometimes it drops to the 70's x 1 time 3 weeks ago in the hospital.  ?Has Peripheral Neuropathy on the feet on Gabapentin.  ?Gastroparesis - on Reglan  ? ?Hypertension -no home blood pressure readings for evaluation.  Currently on Metroprolol tartrate 100 mg twice daily, lisinopril 20 mg daily and amlodipine 5 mg daily ? ?Depression - felt more depressed last week wanted to " end it all " Her mother came to see her and told her not to give up.still feels like it's too much for the family to handle. Thinks it's better for her to move to facility for more assistance. ?Working with Education officer, museum for  place.current on lexapro ,Ativan and Hydroxyzine  ?She is incontinent for both bladder and bowel.  ? ?Migraine - used to get a lot then it stopped then started back last year in [place].Has had a migraine since she was 51 yrs old.On Fioricet   ? ?Urinary tract infection - currently on Keflex for UTI recently prescribed at the hospital.  ?Urine retention - on Tamsulosin  ? ?Hyperthyroidism - on Methimazole 5 mg  ? ?Candidiasis - on Nystatin powder.Has  redness on breast and abdominal folds  ? ? ?Past Medical History:  ?Diagnosis Date  ? CAD (coronary artery disease)   ? Depression   ? Diabetes mellitus without complication ([place])   ? History of CT scan   ? History of mammogram   ? History of MRI   ? Hypertension   ? Pheochromocytoma   ? Pheochromocytoma   ? Stroke [facility])   ? Thyroid disease   ? ?Past Surgical History:  ?Procedure Laterality Date  ? ABDOMINAL HYSTERECTOMY    ? CESAREAN SECTION    ? 3  ? CORONARY ARTERY BYPASS GRAFT    ? Right adrenal gland removal for pheochromocytoma Right   ? ? ?Allergies  ?Allergen Reactions  ? Sumatriptan Anaphylaxis  ? Contrast Media [Iodinated Contrast Media] Swelling  ? Penicillins Swelling  ?  Mouth swells up and eyes swollen shut  ? ? ?Allergies as of 04/20/2021   ? ?   Reactions  ? Sumatriptan Anaphylaxis  ? Contrast Media [iodinated Contrast Media] Swelling  ? Penicillins Swelling  ? Mouth swells up and eyes swollen shut  ? ?  ? ?  ?Medication List  ?  ? ?  ?  Accurate as of April 20, 2021  1:54 PM. If you have any questions, ask your nurse or doctor.  ?  ?  ? ?  ? ?acetaminophen 325 MG tablet ?Commonly known as: TYLENOL ?Take 2 tablets (650 mg total) by mouth every 6 (six) hours as needed for mild pain or headache. ?  ?amLODipine 5 MG tablet ?Commonly known as: NORVASC ?Take 1 tablet (5 mg total) by mouth daily. ?  ?atorvastatin 80 MG tablet ?Commonly known as: LIPITOR ?TAKE 1 TABLET(80 MG) BY MOUTH DAILY ?  ?BD Pen Needle Nano U/F 32G X 4 MM Misc ?Generic drug: Insulin  Pen Needle ?Use to inject insulin 4 times daily as directed. ?  ?blood glucose meter kit and supplies Kit ?Dispense based on patient and insurance preference. Use up to four times daily as directed. ?  ?butalbital-acetaminophen-caffeine 50-325-40 MG tablet ?Commonly known as: FIORICET ?Take 1 tablet by mouth every 6 (six) hours as needed for migraine. ?  ?cephALEXin 500 MG capsule ?Commonly known as: KEFLEX ?Take 1 capsule (500 mg total) by mouth 4 (four) times daily. ?  ?clopidogrel 75 MG tablet ?Commonly known as: PLAVIX ?Take 1 tablet (75 mg total) by mouth daily. ?  ?diclofenac Sodium 1 % Gel ?Commonly known as: VOLTAREN ?Apply 4 g topically 4 (four) times daily. ?  ?escitalopram 20 MG tablet ?Commonly known as: LEXAPRO ?Take 1 tablet (20 mg total) by mouth daily. ?  ?FreeStyle Page 2 Reader Kerrin Mo ?Use to check blood sugar 3 times daily. ?  ?FreeStyle Libre 2 Sensor Misc ?Use to check blood sugar 3 times daily. ?  ?gabapentin 100 MG capsule ?Commonly known as: NEURONTIN ?Take 1 capsule (100 mg total) by mouth 3 (three) times daily. ?  ?hydrOXYzine 25 MG tablet ?Commonly known as: ATARAX ?Take 1 tablet (25 mg total) by mouth at bedtime as needed. ?  ?insulin aspart 100 UNIT/ML FlexPen ?Commonly known as: NOVOLOG ?Inject 4 Units into the skin 3 (three) times daily with meals. ?  ?insulin glargine 100 UNIT/ML Solostar Pen ?Commonly known as: LANTUS ?Inject 29 Units into the skin at bedtime. ?  ?lip balm ointment ?Apply topically as needed for lip care. ?  ?lisinopril 20 MG tablet ?Commonly known as: ZESTRIL ?Take 1 tablet (20 mg total) by mouth daily. ?  ?loratadine 10 MG tablet ?Commonly known as: CLARITIN ?Take 1 tablet (10 mg total) by mouth daily. ?  ?LORazepam 0.5 MG tablet ?Commonly known as: ATIVAN ?Take 1/2 tablet (0.25 mg total) by mouth at bedtime. ?  ?melatonin 3 MG Tabs tablet ?Take 3 tablets (9 mg total) by mouth at bedtime. ?  ?methimazole 5 MG tablet ?Commonly known as: TAPAZOLE ?Take 1 tablet (5 mg  total) by mouth daily. ?  ?metoCLOPramide 10 MG tablet ?Commonly known as: REGLAN ?Take 1 tablet (10 mg total) by mouth 3 (three) times daily before meals. ?  ?metoprolol tartrate 100 MG tablet ?Commonly known as: LOPRESSOR ?Take 1 tablet (100 mg total) by mouth 2 (two) times daily. ?  ?nystatin powder ?Commonly known as: MYCOSTATIN/NYSTOP ?Apply topically 3 (three) times daily to affected areas ?  ?pantoprazole 40 MG tablet ?Commonly known as: PROTONIX ?Take 1 tablet (40 mg total) by mouth daily. ?  ?polyethylene glycol powder 17 GM/SCOOP powder ?Commonly known as: GLYCOLAX/MIRALAX ?Take 17 g by mouth 2 (two) times daily. ?  ?senna 8.6 MG Tabs tablet ?Commonly known as: SENOKOT ?Take 2 tablets (17.2 mg total) by mouth daily AND 1 tablet (8.6 mg total) at bedtime. ?  ?  tamsulosin 0.4 MG Caps capsule ?Commonly known as: FLOMAX ?Take 1 capsule (0.4 mg total) by mouth daily after supper. ?  ? ?  ? ? ?Review of Systems  ?Constitutional:  Negative for appetite change, chills, fatigue, fever and unexpected weight change.  ?HENT:  Negative for congestion, dental problem, ear discharge, ear pain, facial swelling, hearing loss, nosebleeds, postnasal drip, rhinorrhea, sinus pressure, sinus pain, sneezing, sore throat, tinnitus and trouble swallowing.   ?Eyes:  Positive for visual disturbance. Negative for pain, discharge, redness and itching.  ?     Wears eye glasses   ?Respiratory:  Negative for cough, chest tightness, shortness of breath and wheezing.   ?Cardiovascular:  Negative for chest pain, palpitations and leg swelling.  ?Gastrointestinal:  Negative for abdominal distention, abdominal pain, blood in stool, constipation, diarrhea, nausea and vomiting.  ?     Gastroparesis   ?Endocrine: Negative for cold intolerance, heat intolerance, polydipsia, polyphagia and polyuria.  ?Genitourinary:  Negative for difficulty urinating, dysuria, frequency and urgency.  ?     Incontinent   ?Musculoskeletal:  Positive for gait problem.  Negative for arthralgias, back pain, joint swelling, myalgias, neck pain and neck stiffness.  ?Skin:  Positive for rash. Negative for color change, pallor and wound.  ?     Breast and Abdominal fold  ?Neurolog

## 2021-04-20 NOTE — Telephone Encounter (Signed)
She informed me during her office visit that she would like nursing home placement.  Can you please assist?  Thank you. ?

## 2021-04-23 ENCOUNTER — Telehealth: Payer: Self-pay

## 2021-04-23 ENCOUNTER — Other Ambulatory Visit: Payer: Self-pay | Admitting: Physical Medicine and Rehabilitation

## 2021-04-23 ENCOUNTER — Telehealth: Payer: Self-pay | Admitting: *Deleted

## 2021-04-23 MED ORDER — NYSTATIN 100000 UNIT/GM EX POWD
Freq: Three times a day (TID) | CUTANEOUS | 0 refills | Status: DC
Start: 2021-04-23 — End: 2021-04-26

## 2021-04-23 NOTE — Telephone Encounter (Signed)
Mary called back with request for Lori [name]'s Nystatin powder to be refilled. She also notes that there is a class 2 flag between hydroxyzine and ecitalopram.  ?

## 2021-04-23 NOTE — Telephone Encounter (Signed)
Patient called regarding status of FL2 form. Patient states she needs it faxed back to social worker as soon as possible. ? ?Message routed to Marlowe Sax, NP ?

## 2021-04-23 NOTE — Telephone Encounter (Signed)
Patient social worker was supposed to fax paper work and inform provider where to send paperwork.  ?

## 2021-04-23 NOTE — Telephone Encounter (Signed)
She was seen at [facility] on 04/20/2021 to establish care.  I need to call her and confirm who her PCP will be ?

## 2021-04-23 NOTE — Telephone Encounter (Signed)
Mary RN called for [facility] to request POC for SN to be 1wk3 and to add a visit from MSW to assist pt with community resources. Approval given. ?

## 2021-04-24 ENCOUNTER — Other Ambulatory Visit: Payer: Medicaid Other

## 2021-04-24 ENCOUNTER — Other Ambulatory Visit: Payer: Self-pay

## 2021-04-24 DIAGNOSIS — E1169 Type 2 diabetes mellitus with other specified complication: Secondary | ICD-10-CM

## 2021-04-24 DIAGNOSIS — I152 Hypertension secondary to endocrine disorders: Secondary | ICD-10-CM

## 2021-04-24 DIAGNOSIS — Z1159 Encounter for screening for other viral diseases: Secondary | ICD-10-CM

## 2021-04-24 NOTE — Telephone Encounter (Signed)
Message received from Randel Books, LCSW: ? ?She refused to go back to a facility and thought it would work at home. She was standing and walking here with assist. Was suppose to go to husband's rental home here in [place] but needed heat fixed and was suppose to be done with in a few days of her DC. I have spoken with her and had asked for HHSW to see her and work on SNF placement.  ? ? ?I called patient today because it is noted in Epic that she had an appointment at [facility] to establish care on 04/20/2021 yet she was just seen by Dr Margarita Rana last week.  The patient confirmed that she will be following up at [facility] for PCP.  ? ?   ? ?

## 2021-04-24 NOTE — Telephone Encounter (Signed)
Lori [name] out of office, awaiting forms from Lori [name] to be resent.  ?

## 2021-04-24 NOTE — Telephone Encounter (Signed)
Please check with Rodena Piety if she received F L 2 paper work.I spoke with social worker during patient's visit and was supposed to send paper work to be filled.May call the social worker to refax paper work.  ?

## 2021-04-24 NOTE — Telephone Encounter (Signed)
Lori [name] called back and [facility] stating she sent the [facility] and just need it back ? ?Called Lori [name] back and requested she re send forms. She agreed, but states it may not be today but definitely by tomorrow.  ?

## 2021-04-24 NOTE — Telephone Encounter (Signed)
Patient called back. She says paperwork was faxed last Friday and Dinah had it the day she came into the office. Patient states she really need to be in the nursing home as she is unable to care for herself. Patient states social worker's phone number is [phone] Jenny Reichmann ? ?[place], [place] to get an update on the situation. Requested Cindy call back with an update.  ?

## 2021-04-25 LAB — CBC WITH DIFFERENTIAL/PLATELET
Absolute Monocytes: 329 cells/uL (ref 200–950)
Basophils Absolute: 48 cells/uL (ref 0–200)
Basophils Relative: 0.9 %
Eosinophils Absolute: 133 cells/uL (ref 15–500)
Eosinophils Relative: 2.5 %
HCT: 42 % (ref 35.0–45.0)
Hemoglobin: 13.5 g/dL (ref 11.7–15.5)
Lymphs Abs: 1473 cells/uL (ref 850–3900)
MCH: 26.8 pg — ABNORMAL LOW (ref 27.0–33.0)
MCHC: 32.1 g/dL (ref 32.0–36.0)
MCV: 83.5 fL (ref 80.0–100.0)
MPV: 11.1 fL (ref 7.5–12.5)
Monocytes Relative: 6.2 %
Neutro Abs: 3318 cells/uL (ref 1500–7800)
Neutrophils Relative %: 62.6 %
Platelets: 368 10*3/uL (ref 140–400)
RBC: 5.03 10*6/uL (ref 3.80–5.10)
RDW: 14.1 % (ref 11.0–15.0)
Total Lymphocyte: 27.8 %
WBC: 5.3 10*3/uL (ref 3.8–10.8)

## 2021-04-25 LAB — COMPLETE METABOLIC PANEL WITH GFR
AG Ratio: 1.1 (calc) (ref 1.0–2.5)
ALT: 11 U/L (ref 6–29)
AST: 12 U/L (ref 10–35)
Albumin: 3.7 g/dL (ref 3.6–5.1)
Alkaline phosphatase (APISO): 101 U/L (ref 31–125)
BUN: 7 mg/dL (ref 7–25)
CO2: 18 mmol/L — ABNORMAL LOW (ref 20–32)
Calcium: 8.8 mg/dL (ref 8.6–10.2)
Chloride: 104 mmol/L (ref 98–110)
Creat: 0.64 mg/dL (ref 0.50–0.99)
Globulin: 3.3 g/dL (calc) (ref 1.9–3.7)
Glucose, Bld: 241 mg/dL — ABNORMAL HIGH (ref 65–99)
Potassium: 4.1 mmol/L (ref 3.5–5.3)
Sodium: 142 mmol/L (ref 135–146)
Total Bilirubin: 0.2 mg/dL (ref 0.2–1.2)
Total Protein: 7 g/dL (ref 6.1–8.1)
eGFR: 109 mL/min/{1.73_m2} (ref >=60)

## 2021-04-25 LAB — HEPATITIS C ANTIBODY
Hepatitis C Ab: NONREACTIVE
SIGNAL TO CUT-OFF: 0.04 (ref <1.00)

## 2021-04-25 LAB — LIPID PANEL
Cholesterol: 183 mg/dL (ref <200)
HDL: 49 mg/dL — ABNORMAL LOW (ref >=50)
LDL Cholesterol (Calc): 108 mg/dL (calc) — ABNORMAL HIGH
Non-HDL Cholesterol (Calc): 134 mg/dL (calc) — ABNORMAL HIGH (ref <130)
Total CHOL/HDL Ratio: 3.7 (calc) (ref <5.0)
Triglycerides: 143 mg/dL (ref <150)

## 2021-04-25 LAB — HEMOGLOBIN A1C
Hgb A1c MFr Bld: 8.4 % of total Hgb — ABNORMAL HIGH (ref <5.7)
Mean Plasma Glucose: 194 mg/dL
eAG (mmol/L): 10.8 mmol/L

## 2021-04-25 NOTE — Telephone Encounter (Signed)
Patient called to stress the urgency of paperwork being completed today if at all possible. ? ?Patient also asked if Dinah would send in a rx for nystatin powder to [facility] on [address]  ? ?Please advise  ?

## 2021-04-25 NOTE — Telephone Encounter (Signed)
Paperwork Received from Meriam Sprague [phone] with [place].  ? ?FL2 Form filled out and placed in Dinah's folder to complete and sign.  ? ?Once Completed needs to be faxed back to [phone] ?

## 2021-04-25 NOTE — Telephone Encounter (Addendum)
Form faxed to Meriam Sprague with [employer] Fax: [phone] ? ?Form sent to [place].  ?

## 2021-04-25 NOTE — Telephone Encounter (Addendum)
F L 2 completed given to Rodena Piety May,CMA to be faxed to Education officer, [employer].  ?

## 2021-04-26 MED ORDER — NYSTATIN 100000 UNIT/GM EX POWD: Freq: Three times a day (TID) | CUTANEOUS | 0 refills | Status: DC

## 2021-04-26 NOTE — Telephone Encounter (Signed)
Nystatin script send to pharmacy.  ?

## 2021-04-26 NOTE — Addendum Note (Signed)
Addended by: Logan Bores on: 04/26/2021 11:32 AM ? ? Modules accepted: Orders ? ?

## 2021-04-26 NOTE — Addendum Note (Signed)
Addended byMarlowe Sax C on: 04/26/2021 04:24 PM ? ? Modules accepted: Orders ? ?

## 2021-04-26 NOTE — Telephone Encounter (Signed)
Lori [name] the patient had also asked if you would submit a rx for nystatin powder to [facility] on [address] ? ?Side note: Documentation reflects a Dr.Raulkar submitting a rx for nystatin powder  to [facility] in [place],  patient stated she is not using a pharmacy in [place] and asked that I remove that pharmacy from her chart.  ?

## 2021-04-27 ENCOUNTER — Other Ambulatory Visit: Payer: Self-pay

## 2021-04-27 DIAGNOSIS — I10 Essential (primary) hypertension: Secondary | ICD-10-CM

## 2021-04-27 DIAGNOSIS — E1169 Type 2 diabetes mellitus with other specified complication: Secondary | ICD-10-CM

## 2021-04-27 DIAGNOSIS — E7849 Other hyperlipidemia: Secondary | ICD-10-CM

## 2021-04-27 DIAGNOSIS — E0590 Thyrotoxicosis, unspecified without thyrotoxic crisis or storm: Secondary | ICD-10-CM

## 2021-04-30 ENCOUNTER — Telehealth: Payer: Self-pay

## 2021-04-30 ENCOUNTER — Other Ambulatory Visit: Payer: Self-pay

## 2021-04-30 ENCOUNTER — Telehealth: Payer: Self-pay | Admitting: *Deleted

## 2021-04-30 ENCOUNTER — Emergency Department (HOSPITAL_COMMUNITY): Payer: Medicaid Other

## 2021-04-30 ENCOUNTER — Emergency Department (HOSPITAL_COMMUNITY)
Admission: EM | Admit: 2021-04-30 | Discharge: 2021-05-01 | Disposition: A | Discharge status: Home / Self Care | Payer: Medicaid Other | Attending: Emergency Medicine | Admitting: Emergency Medicine

## 2021-04-30 ENCOUNTER — Encounter (HOSPITAL_COMMUNITY): Payer: Self-pay | Admitting: Emergency Medicine

## 2021-04-30 DIAGNOSIS — Z7901 Long term (current) use of anticoagulants: Secondary | ICD-10-CM | POA: Diagnosis not present

## 2021-04-30 DIAGNOSIS — Z79899 Other long term (current) drug therapy: Secondary | ICD-10-CM | POA: Insufficient documentation

## 2021-04-30 DIAGNOSIS — G43819 Other migraine, intractable, without status migrainosus: Secondary | ICD-10-CM | POA: Insufficient documentation

## 2021-04-30 DIAGNOSIS — I2510 Atherosclerotic heart disease of native coronary artery without angina pectoris: Secondary | ICD-10-CM | POA: Diagnosis not present

## 2021-04-30 DIAGNOSIS — G43909 Migraine, unspecified, not intractable, without status migrainosus: Secondary | ICD-10-CM | POA: Diagnosis present

## 2021-04-30 DIAGNOSIS — R471 Dysarthria and anarthria: Secondary | ICD-10-CM | POA: Insufficient documentation

## 2021-04-30 DIAGNOSIS — Z794 Long term (current) use of insulin: Secondary | ICD-10-CM | POA: Diagnosis not present

## 2021-04-30 DIAGNOSIS — E119 Type 2 diabetes mellitus without complications: Secondary | ICD-10-CM | POA: Insufficient documentation

## 2021-04-30 LAB — CBC WITH DIFFERENTIAL/PLATELET
Abs Immature Granulocytes: 0.02 10*3/uL (ref 0.00–0.07)
Basophils Absolute: 0 10*3/uL (ref 0.0–0.1)
Basophils Relative: 1 %
Eosinophils Absolute: 0.2 10*3/uL (ref 0.0–0.5)
Eosinophils Relative: 2 %
HCT: 40.6 % (ref 36.0–46.0)
Hemoglobin: 13.5 g/dL (ref 12.0–15.0)
Immature Granulocytes: 0 %
Lymphocytes Relative: 35 %
Lymphs Abs: 2.2 10*3/uL (ref 0.7–4.0)
MCH: 26.6 pg (ref 26.0–34.0)
MCHC: 33.3 g/dL (ref 30.0–36.0)
MCV: 79.9 fL — ABNORMAL LOW (ref 80.0–100.0)
Monocytes Absolute: 0.4 10*3/uL (ref 0.1–1.0)
Monocytes Relative: 6 %
Neutro Abs: 3.5 10*3/uL (ref 1.7–7.7)
Neutrophils Relative %: 56 %
Platelets: 390 10*3/uL (ref 150–400)
RBC: 5.08 MIL/uL (ref 3.87–5.11)
RDW: 13.7 % (ref 11.5–15.5)
WBC: 6.3 10*3/uL (ref 4.0–10.5)
nRBC: 0 % (ref 0.0–0.2)

## 2021-04-30 LAB — COMPREHENSIVE METABOLIC PANEL
ALT: 16 U/L (ref 0–44)
AST: 17 U/L (ref 15–41)
Albumin: 3.2 g/dL — ABNORMAL LOW (ref 3.5–5.0)
Alkaline Phosphatase: 92 U/L (ref 38–126)
Anion gap: 10 (ref 5–15)
BUN: 7 mg/dL (ref 6–20)
CO2: 23 mmol/L (ref 22–32)
Calcium: 8.7 mg/dL — ABNORMAL LOW (ref 8.9–10.3)
Chloride: 103 mmol/L (ref 98–111)
Creatinine, Ser: 0.68 mg/dL (ref 0.44–1.00)
GFR, Estimated: >60 mL/min (ref >60)
Glucose, Bld: 264 mg/dL — ABNORMAL HIGH (ref 70–99)
Potassium: 4.1 mmol/L (ref 3.5–5.1)
Sodium: 136 mmol/L (ref 135–145)
Total Bilirubin: 0.5 mg/dL (ref 0.3–1.2)
Total Protein: 6.8 g/dL (ref 6.5–8.1)

## 2021-04-30 LAB — CBG MONITORING, ED: Glucose-Capillary: 238 mg/dL — ABNORMAL HIGH (ref 70–99)

## 2021-04-30 LAB — PROTIME-INR
INR: 1 (ref 0.8–1.2)
Prothrombin Time: 13 seconds (ref 11.4–15.2)

## 2021-04-30 IMAGING — CT CT HEAD W/O CM
4 series · 16 of 47 positions shown, 18 images · non-contrast
Comparison: [DATE]

CLINICAL DATA: Unilateral visual loss



[Series 3: head wo · axial · 0.39mm/px · z∈[+1292,+1412]mm · 7 of 32 slices shown, 9 images]
[im 4/32  brain]
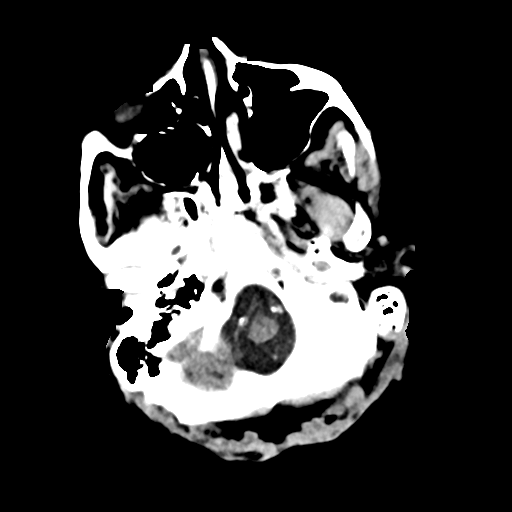
[im 4/32  bone]
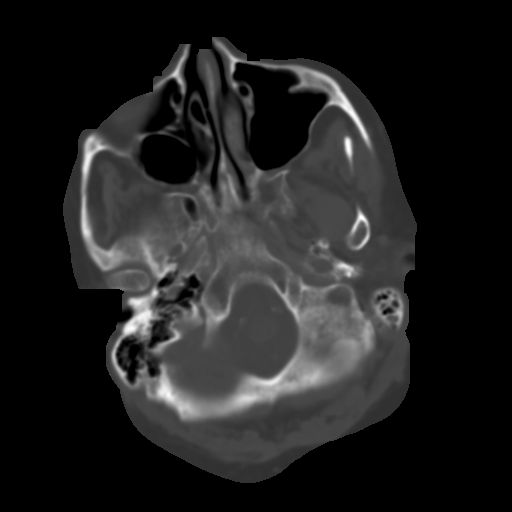
[im 8/32  brain]
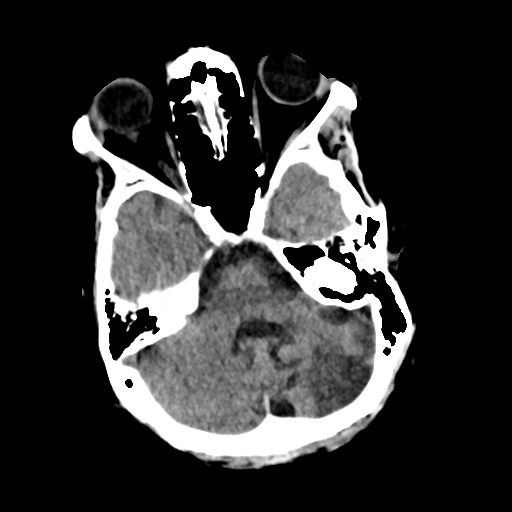
[im 12/32  brain]
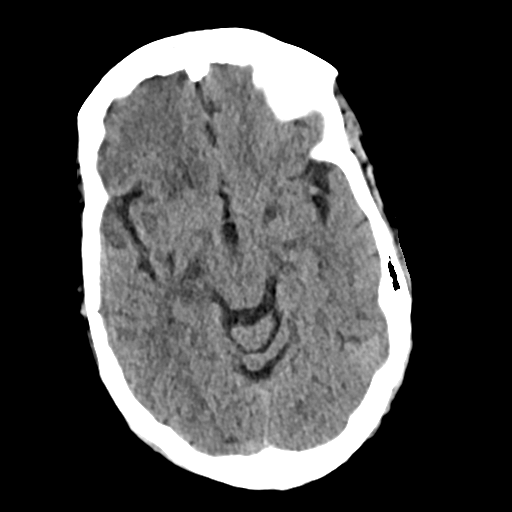
[im 16/32  brain]
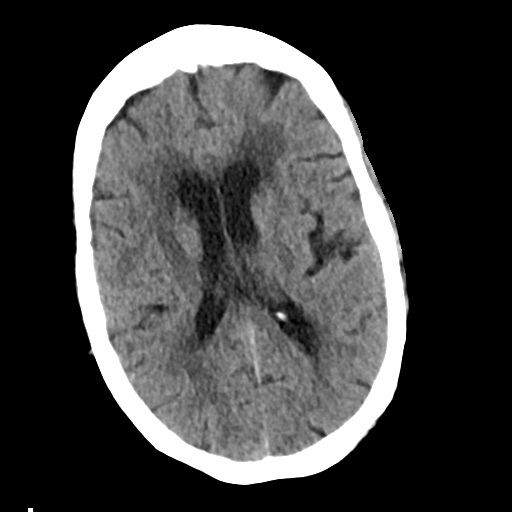
[im 20/32  brain]
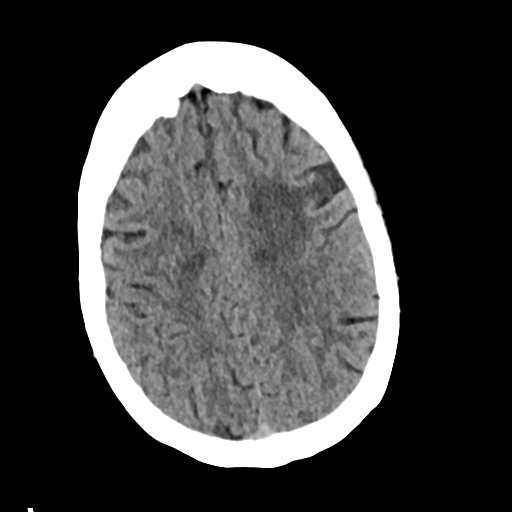
[im 20/32  bone]
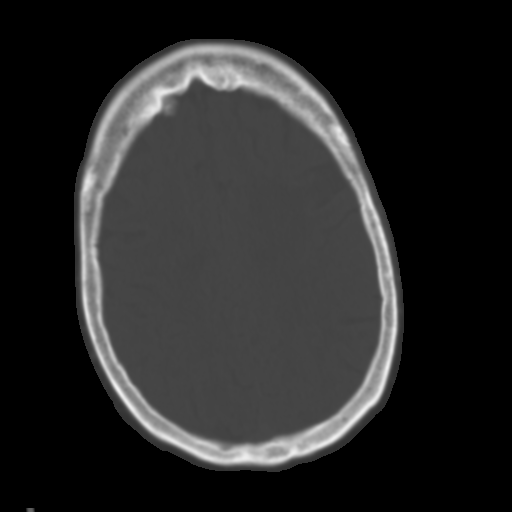
[im 24/32  brain]
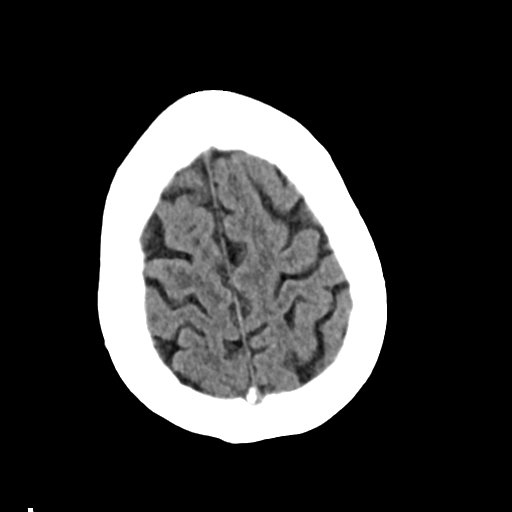
[im 28/32  brain]
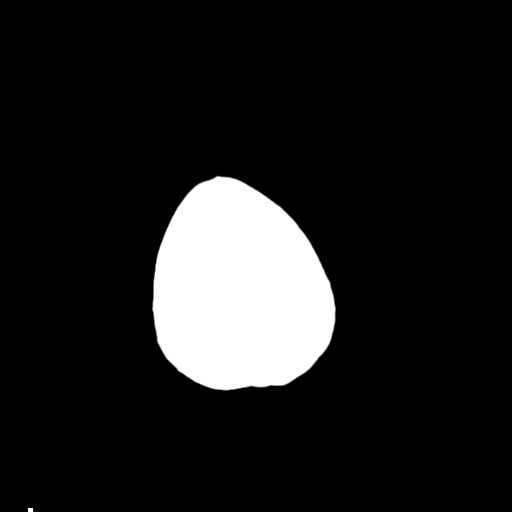

[Series 4: head bone · axial · 0.39mm/px · z∈[+1290,+1322]mm · 3 of 79 slices shown]
[im 8/79  bone]
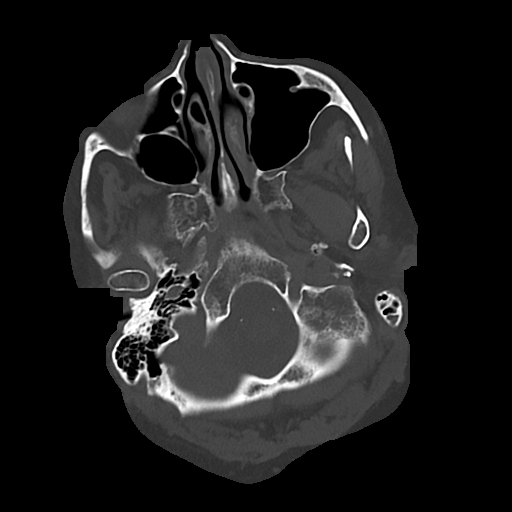
[im 16/79  bone]
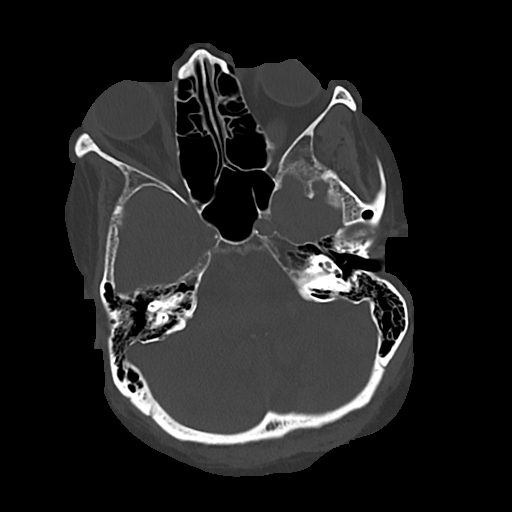
[im 24/79  bone]
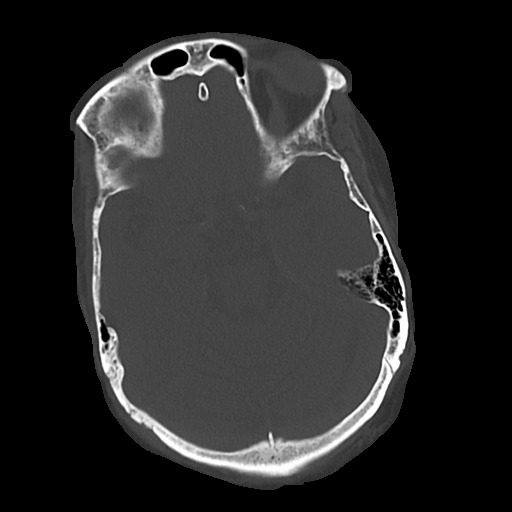

[Series 5: cor soft · coronal · 0.29mm/px · 3 of 68 slices shown]
[im 23/68  brain]
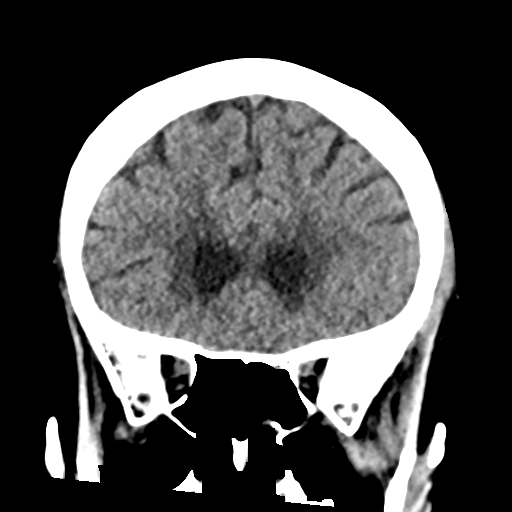
[im 30/68  brain]
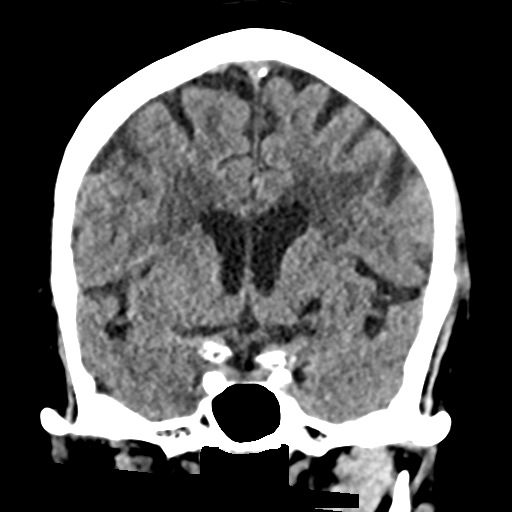
[im 38/68  brain]
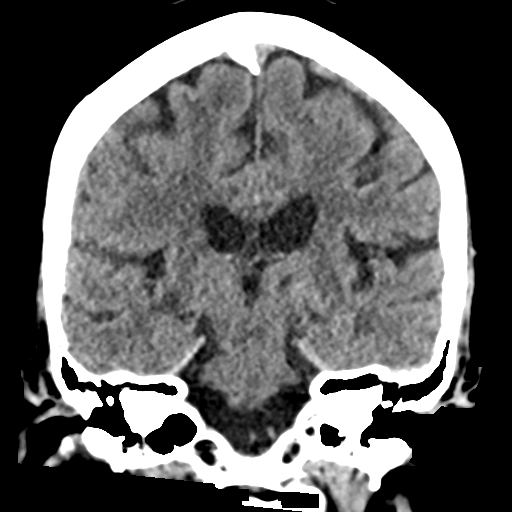

[Series 6: sag soft · sagittal · 0.29mm/px · 3 of 51 slices shown]
[im 17/51  brain]
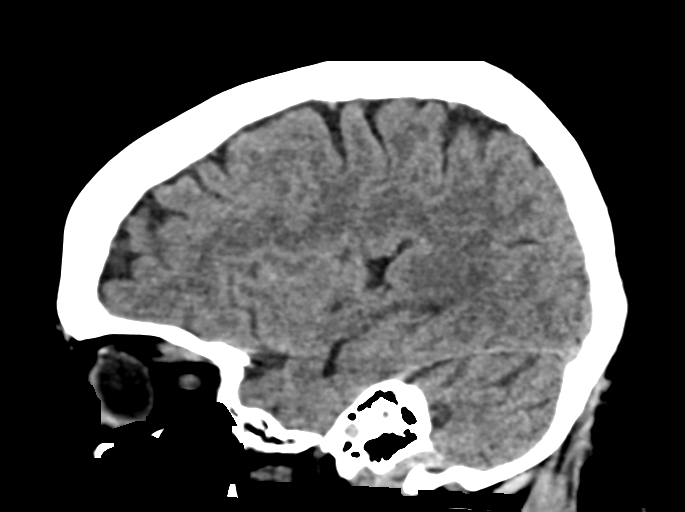
[im 26/51  brain]
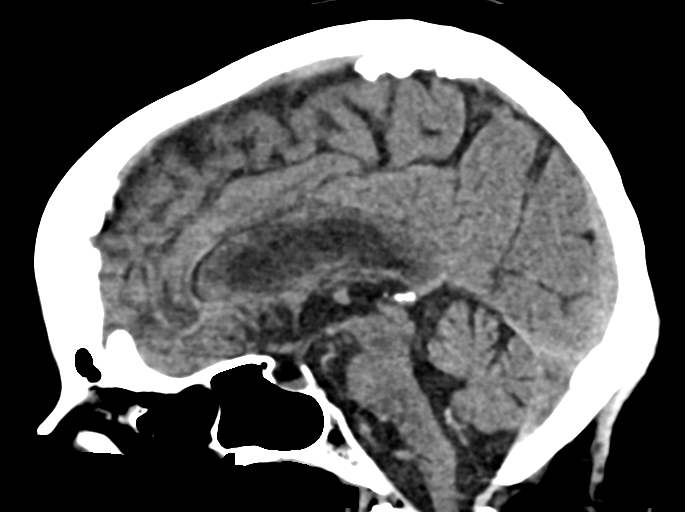
[im 34/51  brain]
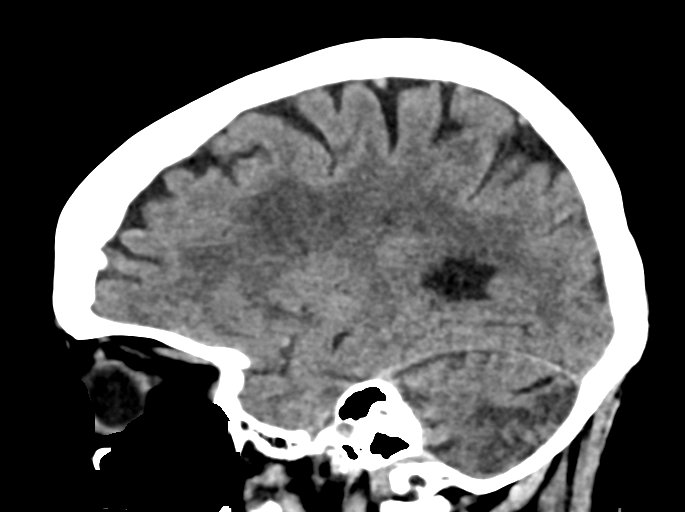

[16 of 47 positions shown; findings below may reference images not displayed]

FINDINGS: Brain: No evidence of acute infarction, hemorrhage, hydrocephalus,
extra-axial collection or mass lesion/mass effect. There are changes
consistent with prior left cerebellar infarct as well as multiple
lacunar infarcts in the thalami and basal ganglia bilaterally.
Chronic white matter ischemic changes are noted as well. Chronic
left frontal infarct is seen as well. These are all stable from the
prior study.

Vascular: No hyperdense vessel or unexpected calcification.

Skull: Normal. Negative for fracture or focal lesion.

Sinuses/Orbits: No acute finding.

Other: None.
IMPRESSION: Chronic ischemic changes with multifocal infarcts stable from the
prior exam.

No acute abnormality noted.

## 2021-04-30 MED ORDER — MORPHINE SULFATE (PF) 4 MG/ML IV SOLN
4.0000 mg | Freq: Once | INTRAVENOUS | Status: DC
  Filled 2021-04-30: qty 1

## 2021-04-30 MED ORDER — HYDROMORPHONE HCL 1 MG/ML IJ SOLN
1.0000 mg | Freq: Once | INTRAMUSCULAR | Status: AC
  Administered 2021-04-30: 1 mg via INTRAVENOUS
  Filled 2021-04-30: qty 1

## 2021-04-30 MED ORDER — ONDANSETRON HCL 4 MG/2ML IJ SOLN
4.0000 mg | Freq: Once | INTRAMUSCULAR | Status: AC
  Administered 2021-04-30: 4 mg via INTRAVENOUS
  Filled 2021-04-30: qty 2

## 2021-04-30 NOTE — ED Provider Notes (Signed)
?[facility] ?Provider Note ? ? ?CSN: [id number] ?Arrival date & time: 04/30/21  1501 ? ?  ? ?History ?Chief Complaint  ?Patient presents with  ? Migraine  ? ? ?Lori [name] is a 51 y.o. female. ? ?51 y.o female with a PMH of DM, CAD, Strokes presents to the ED via EMS with a chief complaint of headache x 12 hours.  Patient endorses a stabbing pain behind bilateral eyes that has been ongoing for the past 12 hours.  She does have a prior history of migraines, does take Fioricet for this but reports her home health nurse did not give her any medication today.  She complains that the headache has been severe in nature, causing some vision changes.  She reports being able to usually see with glasses at baseline, however does feel that today she is unable to see despite wearing her glasses.  She also endorses not taking her insulin today as she has not been able to eat anything.  Reports a couple episodes of nonbilious, nonbloody emesis which usually do not happen with prior headaches.  Denies any trauma, vision loss, sending weakness to upper arms or legs. ?Of note, patient does take Plavix. ? ?The history is provided by the patient and medical records.  ?Migraine ?This is a new problem. The current episode started 6 to 12 hours ago. The problem has not changed since onset.Associated symptoms include headaches. Pertinent negatives include no chest pain, no abdominal pain and no shortness of breath. Nothing aggravates the symptoms. Nothing relieves the symptoms. She has tried nothing for the symptoms.  ? ?  ? ?Home Medications ?Prior to Admission medications   ?Medication Sig Start Date End Date Taking? Authorizing Provider  ?acetaminophen (TYLENOL) 325 MG tablet Take 2 tablets (650 mg total) by mouth every 6 (six) hours as needed for mild pain or headache. 02/26/21   Pokhrel, Corrie Mckusick, MD  ?amLODipine (NORVASC) 5 MG tablet Take 1 tablet (5 mg total) by mouth daily. 04/19/21    Charlott Rakes, MD  ?atorvastatin (LIPITOR) 80 MG tablet TAKE 1 TABLET(80 MG) BY MOUTH DAILY 04/19/21   Charlott Rakes, MD  ?blood glucose meter kit and supplies KIT Dispense based on patient and insurance preference. Use up to four times daily as directed. 03/16/21   Love, Ivan Anchors, PA-C  ?butalbital-acetaminophen-caffeine (FIORICET) 50-325-40 MG tablet Take 1 tablet by mouth every 6 (six) hours as needed for migraine. 04/20/21   Ngetich, Dinah C, NP  ?cephALEXin (KEFLEX) 500 MG capsule Take 1 capsule (500 mg total) by mouth 4 (four) times daily. 04/09/21   Godfrey Pick, MD  ?clopidogrel (PLAVIX) 75 MG tablet Take 1 tablet (75 mg total) by mouth daily. 04/19/21   Charlott Rakes, MD  ?Continuous Blood Gluc Receiver (FREESTYLE LIBRE 2 READER) DEVI Use to check blood sugar 3 times daily. 04/19/21   Charlott Rakes, MD  ?Continuous Blood Gluc Sensor (FREESTYLE LIBRE 2 SENSOR) MISC Use to check blood sugar 3 times daily. 04/19/21   Charlott Rakes, MD  ?diclofenac Sodium (VOLTAREN) 1 % GEL Apply 4 g topically 4 (four) times daily. 03/15/21   Love, Ivan Anchors, PA-C  ?escitalopram (LEXAPRO) 20 MG tablet Take 1 tablet (20 mg total) by mouth daily. 04/19/21   Charlott Rakes, MD  ?gabapentin (NEURONTIN) 100 MG capsule Take 1 capsule (100 mg total) by mouth 3 (three) times daily. 04/19/21   Charlott Rakes, MD  ?hydrOXYzine (ATARAX) 25 MG tablet Take 1 tablet (25 mg total) by mouth  at bedtime as needed. 04/19/21   Charlott Rakes, MD  ?insulin aspart (NOVOLOG) 100 UNIT/ML FlexPen Inject 4 Units into the skin 3 (three) times daily with meals. 04/19/21   Charlott Rakes, MD  ?insulin glargine (LANTUS) 100 UNIT/ML Solostar Pen Inject 29 Units into the skin at bedtime. 04/19/21   Charlott Rakes, MD  ?Insulin Pen Needle 32G X 4 MM MISC Use to inject insulin 4 times daily as directed. 03/15/21   Love, Ivan Anchors, PA-C  ?lip balm (CARMEX) ointment Apply topically as needed for lip care. 03/16/21   Love, Ivan Anchors, PA-C  ?lisinopril (ZESTRIL) 20 MG  tablet Take 1 tablet (20 mg total) by mouth daily. 04/19/21   Charlott Rakes, MD  ?loratadine (CLARITIN) 10 MG tablet Take 1 tablet (10 mg total) by mouth daily. 03/15/21   Bary Leriche, PA-C  ?LORazepam (ATIVAN) 0.5 MG tablet Take 1/2 tablet (0.25 mg total) by mouth at bedtime. 04/20/21   Ngetich, Dinah C, NP  ?melatonin 3 MG TABS tablet Take 3 tablets (9 mg total) by mouth at bedtime. 04/20/21   Ngetich, Dinah C, NP  ?methimazole (TAPAZOLE) 5 MG tablet Take 1 tablet (5 mg total) by mouth daily. 04/19/21   Charlott Rakes, MD  ?metoCLOPramide (REGLAN) 10 MG tablet Take 1 tablet (10 mg total) by mouth 3 (three) times daily before meals. 03/15/21   Love, Ivan Anchors, PA-C  ?metoprolol tartrate (LOPRESSOR) 100 MG tablet Take 1 tablet (100 mg total) by mouth 2 (two) times daily. 04/19/21   Charlott Rakes, MD  ?nystatin (MYCOSTATIN/NYSTOP) powder Apply topically 3 (three) times daily to affected areas 04/26/21   Ngetich, Dinah C, NP  ?pantoprazole (PROTONIX) 40 MG tablet Take 1 tablet (40 mg total) by mouth daily. 04/19/21   Charlott Rakes, MD  ?polyethylene glycol powder (GLYCOLAX/MIRALAX) 17 GM/SCOOP powder Take 17 g by mouth 2 (two) times daily. 03/15/21   Love, Ivan Anchors, PA-C  ?senna (SENOKOT) 8.6 MG TABS tablet Take 2 tablets (17.2 mg total) by mouth daily AND 1 tablet (8.6 mg total) at bedtime. 03/15/21   Love, Ivan Anchors, PA-C  ?tamsulosin (FLOMAX) 0.4 MG CAPS capsule Take 1 capsule (0.4 mg total) by mouth daily after supper. 04/19/21   Charlott Rakes, MD  ?   ? ?Allergies    ?Sumatriptan, Contrast media [iodinated contrast media], and Penicillins   ? ?Review of Systems   ?Review of Systems  ?Constitutional:  Negative for chills and fever.  ?HENT:  Negative for sinus pressure.   ?Eyes:  Positive for photophobia and visual disturbance.  ?Respiratory:  Negative for shortness of breath.   ?Cardiovascular:  Negative for chest pain.  ?Gastrointestinal:  Negative for abdominal pain, nausea and vomiting.  ?Genitourinary:  Negative for  flank pain.  ?Musculoskeletal:  Negative for back pain.  ?Skin:  Negative for pallor and wound.  ?Neurological:  Positive for headaches.  ?All other systems reviewed and are negative. ? ?Physical Exam ?Updated Vital Signs ?BP (!) 179/98 (BP Location: Right Arm)   Pulse 81   Temp 98.7 ?F (37.1 ?C) (Oral)   Resp 18   SpO2 95%  ?Physical Exam ?Vitals and nursing note reviewed.  ?Constitutional:   ?   Appearance: Normal appearance.  ?HENT:  ?   Head: Normocephalic and atraumatic.  ?   Mouth/Throat:  ?   Mouth: Mucous membranes are moist.  ?Eyes:  ?   Pupils: Pupils are equal, round, and reactive to light.  ?Cardiovascular:  ?   Rate and Rhythm: Normal  rate.  ?Pulmonary:  ?   Effort: Pulmonary effort is normal.  ?Abdominal:  ?   General: Abdomen is flat.  ?   Palpations: Abdomen is soft.  ?Musculoskeletal:  ?   Cervical back: Normal range of motion and neck supple.  ?Skin: ?   General: Skin is warm and dry.  ?Neurological:  ?   Mental Status: She is alert and oriented to person, place, and time. Mental status is at baseline.  ?   Cranial Nerves: Dysarthria present.  ?   Motor: Weakness (left upper arm and leg at baseline per patient) present.  ?   Comments: Alert, oriented, slow thought content appropriate. Speech with aphasia at baseline. Able to follow 2 step commands without difficulty.  ?Cranial Nerves:  ?II:  Peripheral visual fields grossly normal, pupils, round, reactive to light ?III,IV, VI: ptosis not present, extra-ocular motions intact bilaterally  ?V,VII: smile symmetric, facial light touch sensation equal ?VIII: hearing grossly normal bilaterally  ?IX,X: midline uvula rise  ?XI: bilateral shoulder shrug equal and strong ?XII: midline tongue extension  ?Motor:  ?3/5 LEFT upper and lower extremity at baseline per patient. ?5/5 in RIGHT  lower extremities bilaterally including strong and equal grip strength and dorsiflexion/plantar flexion ?Sensory: light touch normal in all extremities.  ?Cerebellar:  normal finger-to-nose with bilateral upper extremities, pronator drift negative ?Gait: not tested ? ?  ? ? ?ED Results / Procedures / Treatments   ?Labs ?(all labs ordered are listed, but only abnormal results are d

## 2021-04-30 NOTE — ED Triage Notes (Signed)
Patient BIB [facility] from home, compliant of migraine today, patient with hx of same did not take her migraine medication today. ?

## 2021-04-30 NOTE — Telephone Encounter (Signed)
OK- thank you- she hasn't followed up with me lately- ML

## 2021-04-30 NOTE — Care Management (Signed)
ED RNCM  received  TOC  consult  concerning SNF placement.  Met with patient to discuss transitions of care planning.  Patient confirms that she lives with husband who assists with care.  Patient is here requesting  placement. RNCM noted patient to be active with [facility] and they have obtained an FL2 and is currently working on placement.  Sent Update to   [facility] Liaison for ED f/u [facility] visit. ?

## 2021-04-30 NOTE — Telephone Encounter (Signed)
Mary RN called to let Dr Dagoberto Ligas know that Ms Lori [name] was sent to ED today with elevated BP and headache.  ?

## 2021-04-30 NOTE — Telephone Encounter (Signed)
Patients home health nurse called advising Lori Ngetich,NP that pt's blood pressure was elevated to 160/110 and she was complaining of a headache. Lori [name] wanted to know if Lori Silversmith Ngetich,NP wants patient to be send to the hospital and I advised Lori Ngetich,NP and she states for patient to be send to the hospital. ?

## 2021-04-30 NOTE — Discharge Instructions (Signed)
Your laboratory results were within normal limits.  ? ?You will need continue treatment with your medication. You will need to follow up with social worker, in order to obtain long term placement.  ?

## 2021-05-01 ENCOUNTER — Telehealth: Payer: Self-pay | Admitting: *Deleted

## 2021-05-01 NOTE — Telephone Encounter (Signed)
Received fax from Domingo Madeira with [facility]. They noted that they received a referral from DSS for patient. Requesting [place] to complete PCS form and fax it to: ?[place] at [employer]: [phone] ? ?Form filled out and placed in Dinah's folder to review, fill out and sign.  ? ?Once completed and sign please fax to [place] ?

## 2021-05-02 ENCOUNTER — Ambulatory Visit: Payer: Medicare Other | Admitting: Podiatry

## 2021-05-03 NOTE — Telephone Encounter (Signed)
Paperwork filled out and signed.  ?Faxed to [employer] [phone] as requested.  ?

## 2021-05-07 ENCOUNTER — Telehealth: Payer: Self-pay | Admitting: *Deleted

## 2021-05-07 NOTE — Telephone Encounter (Signed)
Thanks- ML

## 2021-05-07 NOTE — Telephone Encounter (Signed)
Lori [name] PT called to report BP was 152/104.  I have called her number back that she left  but there is no name attached to VM so no patient information left on VM other than they will need to call the PCP to report this information.  Lori [name] can call us back if she does not know who the PCP is, but I did not leave specific information on this line. ?

## 2021-05-08 NOTE — Telephone Encounter (Signed)
Jasmine with [employer] called and stated that they received patient's PCS paperwork but patient's phone number is missing in Step 2. Needing filled out and refaxed.  ? ?Paperwork updated and refaxed to [facility].  ?

## 2021-05-09 ENCOUNTER — Ambulatory Visit: Payer: Medicare Other | Admitting: Podiatry

## 2021-05-14 ENCOUNTER — Encounter
Payer: Medicaid Other | Attending: Physical Medicine and Rehabilitation | Admitting: Physical Medicine and Rehabilitation

## 2021-05-14 ENCOUNTER — Other Ambulatory Visit: Payer: Self-pay | Admitting: Physical Medicine and Rehabilitation

## 2021-05-14 ENCOUNTER — Encounter: Payer: Self-pay | Admitting: Physical Medicine and Rehabilitation

## 2021-05-14 ENCOUNTER — Other Ambulatory Visit: Payer: Self-pay

## 2021-05-14 VITALS — BP 159/102 | HR 83 | Temp 98.8°F | Ht 62.0 in

## 2021-05-14 DIAGNOSIS — I69398 Other sequelae of cerebral infarction: Secondary | ICD-10-CM | POA: Insufficient documentation

## 2021-05-14 DIAGNOSIS — I639 Cerebral infarction, unspecified: Secondary | ICD-10-CM | POA: Diagnosis present

## 2021-05-14 DIAGNOSIS — R252 Cramp and spasm: Secondary | ICD-10-CM | POA: Diagnosis present

## 2021-05-14 DIAGNOSIS — F32A Depression, unspecified: Secondary | ICD-10-CM

## 2021-05-14 DIAGNOSIS — G8194 Hemiplegia, unspecified affecting left nondominant side: Secondary | ICD-10-CM | POA: Diagnosis present

## 2021-05-14 DIAGNOSIS — E1169 Type 2 diabetes mellitus with other specified complication: Secondary | ICD-10-CM

## 2021-05-14 MED ORDER — LORAZEPAM 0.5 MG PO TABS: 0.2500 mg | ORAL_TABLET | Freq: Every day | ORAL | 0 refills | Status: DC

## 2021-05-14 MED ORDER — HYDROXYZINE HCL 25 MG PO TABS: 25.0000 mg | ORAL_TABLET | Freq: Every evening | ORAL | 3 refills | Status: DC | PRN

## 2021-05-14 MED ORDER — AMLODIPINE BESYLATE 10 MG PO TABS: 10.0000 mg | ORAL_TABLET | Freq: Every day | ORAL | 0 refills | Status: DC

## 2021-05-14 MED ORDER — INSULIN GLARGINE 100 UNIT/ML SOLOSTAR PEN: 29.0000 [IU] | PEN_INJECTOR | Freq: Every day | SUBCUTANEOUS | 0 refills | Status: DC

## 2021-05-14 NOTE — Telephone Encounter (Signed)
Stanton Kidney, Nurse with [facility] called stating that patient needs refills on medications Lantus, Hydroxizine and Lorazepam. Stanton Kidney would also like to request to see patient 1xwk for 2wks. ? ?Refills have been sent to pharmacy. Lorazepam has been pended and sent to Marlowe Sax, NP for approval. ?

## 2021-05-14 NOTE — Patient Instructions (Signed)
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 - and Debility related to DKA and recent rehab admission- also has aphasia and poorly controlled HTN ? ?Suggest calling [facility] on [address] to see about Lifestyle Libre to get better control of blood sugars. Needs to have a goal of less than 150.  ? ?2.  Needs to try and increase Norvasc to improve HTN control/Blood pressure too high so will increase to 10 mg daily- is just the higher dose since already on 5 mg daily.  ? ? ?3. Will schedule for Dr Letta Pate to do Botox/botulinum toxin of LUE- suggest 300-units of LUE ? ? ?4. Pt cannot take care of self and really needs CAP benefits to be taken care of in a home setting. Or be placed in nursing home long term.  ?Would need 7 days/week care.  ?They've already started the process to put her in nursing home- started Friday ? ?5.  F/U with Dr Letta Pate for Botox and me in 4 months. ?

## 2021-05-14 NOTE — Telephone Encounter (Signed)
Patient is requesting a 90 day supply

## 2021-05-14 NOTE — Progress Notes (Signed)
? ?Subjective:  ? ? Patient ID: Lori [name], female    DOB: July 20, 1970, 51 y.o.   MRN: [id number] ? 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  and Debility related to DKA and recent rehab admission- also has aphasia and poorly controlled HTN ? ?Here for hospital f/u.  ? ? ?Is taking BP meds- ?Saw PCP 3 weeks ago.  ?Didn't change her BP meds.  ? ?I keep getting calls form [facility] saying her BP is elevated- even up to [id number] systolic- and is 993/570 today.  ? ?Is on Norvasc 5 mg daily; Lisinopril 20 mg daily and Metoprolol 100 mg BID.  ? ?Checking CBGs-  ?Usually running low 200s- sometimes lower, but last 3 days 220 or so.  ?Checks CBGs  4x/day.  ?Wants to use Freestyle Elenor Legato- has Rx for it- hasn't gotten insurance coverage yet?- She isn't aware that Rx was written 3/16.  ? ?Is interested in Botox of LUE for spasticity.  ? ?Thinks needs to be in nursing home- she cannot take care of herself- [employer] says she shouldn't be home alone.  ? ?Only gets therapy 2x/week for 30 minutes. ?Cannot cook- cannot bathe- right now, pays out of pocket for nursing. And cannot walk; dress herself, etc.  ? ? ?Pain Inventory ?Average Pain 0 ?Pain Right Now 0 ?My pain is  no pain ? ?LOCATION OF PAIN  No pain  ? ?BOWEL ?Number of stools per week: 2 ?Oral laxative use Yes  ?Type of laxative Miralax ?Enema or suppository use No  ?History of colostomy No  ?Incontinent Yes m ? ?BLADDER ?Normal and Pads ?In and out cath, frequency no ?Able to self cath No  ?Bladder incontinence Yes  ?Frequent urination Yes  ? ? ? ? ?Mobility ?ability to climb steps?  no ?do you drive?  no ?use a wheelchair ?needs help with transfers ?Do you have any goals in this area?  yes ? ?Function ?disabled: date disabled 2012 ?I need assistance with the following:  dressing, bathing, toileting, meal prep, household duties, and shopping ?Do you have any  goals in this area?  yes ? ?Neuro/Psych ?bladder control problems ?weakness ?numbness ?tremor ?tingling ?trouble walking ?spasms ?depression ?anxiety ? ?Prior Studies ?Any changes since last visit?  yes ? ?Physicians involved in your care ?Any changes since last visit?  no ? ? ?Family History  ?Problem Relation Age of Onset  ? Diabetes Mother   ? Hypertension Father   ? Heart attack Father   ? Dementia Father   ? Diabetes Brother   ? Brain cancer Brother   ? Asthma Daughter   ? Sickle cell anemia Son   ? ?Social History  ? ?Socioeconomic History  ? Marital status: Divorced  ?  Spouse name: Not on file  ? Number of children: Not on file  ? Years of education: Not on file  ? Highest education level: Not on file  ?Occupational History  ? Not on file  ?Tobacco Use  ? Smoking status: Never  ? Smokeless tobacco: Never  ?Substance and Sexual Activity  ? Alcohol use: Not Currently  ? Drug use: Never  ? Sexual activity: Not on file  ?Other Topics Concern  ? Not on file  ?Social History Narrative  ? Tobacco use, amount per day now: Never  ? Past tobacco use, amount per day: Never  ? How many years  did you use tobacco: Never  ? Alcohol use (drinks per week): Not Currently.  ? Diet: Eat out a lot.   ? Do you drink/eat things with caffeine: Sweet Tea  ? Marital status:   Married                               What year were you married? 2000  ? Do you live in a house, apartment, assisted living, condo, trailer, etc.? House  ? Is it one or more stories? 1  ? How many persons live in your home? 2  ? Do you have pets in your home?( please list) No  ? Highest Level of education completed? Bachelors Degree.  ? Current or past profession: Location manager, [employer].  ? Do you exercise?   No                               Type and how often?  ? Do you have a living will? No  ? Do you have a DNR form?       No                            If not, do you want to discuss one?  ? Do you have signed  POA/HPOA forms?   No                     If so, please bring to you appointment  ?   ? Do you have any difficulty bathing or dressing yourself? Yes  ? Do you have any difficulty preparing food or eating? Yes  ? Do you have any difficulty managing your medications? Yes  ? Do you have any difficulty managing your finances? No  ? Do you have any difficulty affording your medications? Yes  ? ?Social Determinants of Health  ? ?Financial Resource Strain: Not on file  ?Food Insecurity: Not on file  ?Transportation Needs: Not on file  ?Physical Activity: Not on file  ?Stress: Not on file  ?Social Connections: Not on file  ? ?Past Surgical History:  ?Procedure Laterality Date  ? ABDOMINAL HYSTERECTOMY    ? CESAREAN SECTION    ? 3  ? CORONARY ARTERY BYPASS GRAFT    ? Right adrenal gland removal for pheochromocytoma Right   ? ?Past Medical History:  ?Diagnosis Date  ? CAD (coronary artery disease)   ? Depression   ? Diabetes mellitus without complication ([place])   ? History of CT scan   ? History of mammogram   ? History of MRI   ? Hypertension   ? Pheochromocytoma   ? Pheochromocytoma   ? Stroke [facility])   ? Thyroid disease   ? ?There were no vitals taken for this visit. ? ?Opioid Risk Score:   ?Fall Risk Score:  `1 ? ?Depression screen PHQ 2/9 ? ?   ? View : No data to display.  ?  ?  ?  ?  ?Review of Systems ?An entire ROS was completed and negative except for HPI.  ?   ?Objective:  ? Physical Exam ? ?Hyperextension of MCPs on L hand with hyperflexion of Wrist; also has 3rd/5th digits that are painfully flexed at PIPs.  ?MAS of 4 in L elbow and 4 in L shoulder esp with  flexion/abduction and external rotation.  ?MAS of 1+ to 2 in L knee/ankle and hip ? ? ?   ?Assessment & Plan:  ? 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 - and Debility related to DKA and recent rehab admission- also has aphasia and poorly controlled HTN ? ?Suggest calling  [facility] on [address] to see about Lifestyle Libre to get better control of blood sugars. Needs to have a goal of less than 150.  ? ?2.  Needs to try and increase Norvasc to improve HTN control/Blood pressure too high so will increase to 10 mg daily- is just the higher dose since already on 5 mg daily.  ? ? ?3. Will schedule for Dr Letta Pate to do Botox/botulinum toxin of LUE- suggest 300-units of LUE ? ? ?4. Pt cannot take care of self and really needs CAP benefits to be taken care of in a home setting. Or be placed in nursing home long term.  ?Would need 7 days/week care.  ?They've already started the process to put her in nursing home- started Friday ? ?5.  F/U with Dr Letta Pate for Botox and me in 4 months.  ? ? ?I spent a total of 23   minutes on total care today- >50% coordination of care- due to discussing her BP, DM, and Botox education ? ?

## 2021-05-15 NOTE — Telephone Encounter (Signed)
I called and left message for Mry given verbal orders for additional visits. ?

## 2021-05-16 ENCOUNTER — Telehealth: Payer: Self-pay | Admitting: *Deleted

## 2021-05-16 ENCOUNTER — Other Ambulatory Visit: Payer: Self-pay | Admitting: Family Medicine

## 2021-05-16 DIAGNOSIS — F32A Depression, unspecified: Secondary | ICD-10-CM

## 2021-05-16 NOTE — Telephone Encounter (Signed)
Noted  

## 2021-05-16 NOTE — Telephone Encounter (Signed)
Had patient taken her blood pressure medication ? Patient to recheck blood pressure is she has a blood pressure cuff but if not will need to be evaluated in urgent care or ED  ?

## 2021-05-16 NOTE — Telephone Encounter (Signed)
Lori [name] with [facility] called and stated that she saw patient today and her BP was 156/102. No other complaints noted.  ? ?FYI ?

## 2021-05-16 NOTE — Telephone Encounter (Signed)
Called and spoke with patient and she stated that the blood pressure was taken After she took her BP medications.  ? ?Stated that she cannot check her blood pressure and has no one to check it today.  ? ?Patient refusing to go to Urgent Care or ER due to long wait times.  ? ?Patient scheduled an appointment with Dinah for tomorrow to have evaluated.  ?

## 2021-05-17 ENCOUNTER — Encounter: Payer: Medicaid Other | Admitting: Family

## 2021-05-17 ENCOUNTER — Encounter: Payer: Self-pay | Admitting: Family

## 2021-05-17 ENCOUNTER — Ambulatory Visit (INDEPENDENT_AMBULATORY_CARE_PROVIDER_SITE_OTHER): Payer: Medicaid Other | Admitting: Family

## 2021-05-17 VITALS — BP 138/90 | HR 96 | Temp 96.4°F | Resp 18 | Ht 62.0 in

## 2021-05-17 DIAGNOSIS — I639 Cerebral infarction, unspecified: Secondary | ICD-10-CM

## 2021-05-17 DIAGNOSIS — E785 Hyperlipidemia, unspecified: Secondary | ICD-10-CM

## 2021-05-17 DIAGNOSIS — I10 Essential (primary) hypertension: Secondary | ICD-10-CM

## 2021-05-17 DIAGNOSIS — R3981 Functional urinary incontinence: Secondary | ICD-10-CM | POA: Diagnosis not present

## 2021-05-17 DIAGNOSIS — E1169 Type 2 diabetes mellitus with other specified complication: Secondary | ICD-10-CM | POA: Diagnosis not present

## 2021-05-17 DIAGNOSIS — R159 Full incontinence of feces: Secondary | ICD-10-CM | POA: Diagnosis not present

## 2021-05-17 LAB — GLUCOSE, POCT (MANUAL RESULT ENTRY): POC Glucose: 243 mg/dl — AB (ref 70–99)

## 2021-05-17 NOTE — Progress Notes (Signed)
? ?Provider: Marlowe Sax FNP-C ? ?Chauna Osoria, Nelda Bucks, NP ? ?Patient Care Team: ?Paxton Binns, Nelda Bucks, NP as PCP - General (Family Medicine) ? ?Extended Emergency Contact Information ?Primary Emergency Contact: [name],[name] ?Home Phone: [phone] ?Mobile Phone: [phone] ?Relation: Daughter ?Secondary Emergency Contact: [name],[name] ?Mobile Phone: [phone] ?Relation: Spouse ?Preferred language: English ?Interpreter needed? No ? ?Code Status: Full code ?Goals of care: Advanced Directive information ? ?  05/17/2021  ?  1:55 PM  ?Advanced Directives  ?Does Patient Have a Medical Advance Directive? No  ?Would patient like information on creating a medical advance directive? No - Patient declined  ? ? ? ?Chief Complaint  ?Patient presents with  ? Acute Visit  ?  Patient complains of blood pressure running high, high blood sugars, and blurry vision for the past two days.   ? ? ?HPI:  ?Pt is a 51 y.o. female seen today for an acute visit for evaluation of high blood pressure for the past 2 days.  Home nurse called yesterday to report that the patient's blood pressure was 156/102 at home she was advised to go to urgent care or ED for evaluation since it was late in the evening.  Patient scheduled appointment for today stated.  Blood pressure today is within normal range.  States has been having anxiety due to having to stay home alone while the husband goes to work most of the time.  Social worker still working on transferring patient to skilled facility. ?She denies any headache,dizziness,vision changes,fatigue,chest tightness,palpitation,chest pain or shortness of breath.    ? ?She requests paperwork to be filled for possible care system.  States hired a [employer] manager coming twice a week as she was paying out-of-pocket but has stopped unclear why but states has been sick. ? ?Also requests incontinence adult size diapers supplies to be completed. ? ?Past Medical History:  ?Diagnosis Date  ? CAD (coronary artery disease)    ? Depression   ? Diabetes mellitus without complication ([place])   ? History of CT scan   ? History of mammogram   ? History of MRI   ? Hypertension   ? Pheochromocytoma   ? Pheochromocytoma   ? Stroke [facility])   ? Thyroid disease   ? ?Past Surgical History:  ?Procedure Laterality Date  ? ABDOMINAL HYSTERECTOMY    ? CESAREAN SECTION    ? 3  ? CORONARY ARTERY BYPASS GRAFT    ? Right adrenal gland removal for pheochromocytoma Right   ? ? ?Allergies  ?Allergen Reactions  ? Sumatriptan Anaphylaxis  ? Contrast Media [Iodinated Contrast Media] Swelling  ? Penicillins Swelling  ?  Mouth swells up and eyes swollen shut  ? ? ?Outpatient Encounter Medications as of 05/17/2021  ?Medication Sig  ? acetaminophen (TYLENOL) 325 MG tablet Take 2 tablets (650 mg total) by mouth every 6 (six) hours as needed for mild pain or headache.  ? amLODipine (NORVASC) 10 MG tablet Take 1 tablet (10 mg total) by mouth daily. For elevated BP- please call PCP for refills- in future  ? atorvastatin (LIPITOR) 80 MG tablet TAKE 1 TABLET(80 MG) BY MOUTH DAILY  ? blood glucose meter kit and supplies KIT Dispense based on patient and insurance preference. Use up to four times daily as directed.  ? butalbital-acetaminophen-caffeine (FIORICET) 50-325-40 MG tablet Take 1 tablet by mouth every 6 (six) hours as needed for migraine.  ? cephALEXin (KEFLEX) 500 MG capsule Take 1 capsule (500 mg total) by mouth 4 (four) times daily.  ? clopidogrel (PLAVIX) 75  MG tablet Take 1 tablet (75 mg total) by mouth daily.  ? Continuous Blood Gluc Receiver (FREESTYLE LIBRE 2 READER) DEVI Use to check blood sugar 3 times daily.  ? Continuous Blood Gluc Sensor (FREESTYLE LIBRE 2 SENSOR) MISC Use to check blood sugar 3 times daily.  ? diclofenac Sodium (VOLTAREN) 1 % GEL Apply 4 g topically 4 (four) times daily.  ? escitalopram (LEXAPRO) 20 MG tablet TAKE 1 TABLET(20 MG) BY MOUTH DAILY  ? gabapentin (NEURONTIN) 100 MG capsule TAKE 1 CAPSULE(100 MG) BY MOUTH THREE TIMES DAILY  ?  hydrOXYzine (ATARAX) 25 MG tablet Take 1 tablet (25 mg total) by mouth at bedtime as needed.  ? insulin aspart (NOVOLOG) 100 UNIT/ML FlexPen Inject 4 Units into the skin 3 (three) times daily with meals.  ? insulin glargine (LANTUS) 100 UNIT/ML Solostar Pen Inject 29 Units into the skin at bedtime.  ? Insulin Pen Needle 32G X 4 MM MISC Use to inject insulin 4 times daily as directed.  ? lip balm (CARMEX) ointment Apply topically as needed for lip care.  ? lisinopril (ZESTRIL) 20 MG tablet Take 1 tablet (20 mg total) by mouth daily.  ? loratadine (CLARITIN) 10 MG tablet Take 1 tablet (10 mg total) by mouth daily.  ? LORazepam (ATIVAN) 0.5 MG tablet Take 1/2 tablet (0.25 mg total) by mouth at bedtime.  ? melatonin 3 MG TABS tablet Take 3 tablets (9 mg total) by mouth at bedtime.  ? methimazole (TAPAZOLE) 5 MG tablet Take 1 tablet (5 mg total) by mouth daily.  ? metoCLOPramide (REGLAN) 10 MG tablet Take 1 tablet (10 mg total) by mouth 3 (three) times daily before meals.  ? metoprolol tartrate (LOPRESSOR) 100 MG tablet Take 1 tablet (100 mg total) by mouth 2 (two) times daily.  ? nystatin (MYCOSTATIN/NYSTOP) powder Apply topically 3 (three) times daily to affected areas  ? pantoprazole (PROTONIX) 40 MG tablet Take 1 tablet (40 mg total) by mouth daily.  ? polyethylene glycol powder (GLYCOLAX/MIRALAX) 17 GM/SCOOP powder Take 17 g by mouth 2 (two) times daily.  ? senna (SENOKOT) 8.6 MG TABS tablet Take 2 tablets (17.2 mg total) by mouth daily AND 1 tablet (8.6 mg total) at bedtime.  ? tamsulosin (FLOMAX) 0.4 MG CAPS capsule Take 1 capsule (0.4 mg total) by mouth daily after supper.  ? ?No facility-administered encounter medications on file as of 05/17/2021.  ? ? ?Review of Systems  ?Constitutional:  Negative for appetite change, chills, fatigue, fever and unexpected weight change.  ?HENT:  Negative for congestion, dental problem, ear discharge, ear pain, facial swelling, hearing loss, nosebleeds, postnasal drip,  rhinorrhea, sinus pressure, sinus pain, sneezing, sore throat, tinnitus and trouble swallowing.   ?Eyes:  Positive for visual disturbance. Negative for pain, discharge, redness and itching.  ?     Notified to ophthalmology on previous visit for visual impairment.  ?Respiratory:  Negative for cough, chest tightness, shortness of breath and wheezing.   ?Cardiovascular:  Negative for chest pain, palpitations and leg swelling.  ?Gastrointestinal:  Negative for abdominal distention, abdominal pain, blood in stool, constipation, diarrhea, nausea and vomiting.  ?     Incontinent   ?Endocrine: Negative for cold intolerance, heat intolerance, polydipsia, polyphagia and polyuria.  ?Genitourinary:  Negative for difficulty urinating, dysuria, flank pain, frequency and urgency.  ?Musculoskeletal:  Positive for gait problem. Negative for arthralgias, back pain, joint swelling, myalgias, neck pain and neck stiffness.  ?Skin:  Negative for color change, pallor, rash and wound.  ?Neurological:  Positive for weakness. Negative for dizziness, syncope, speech difficulty, light-headedness, numbness and headaches.  ?Hematological:  Does not bruise/bleed easily.  ?Psychiatric/Behavioral:  Negative for agitation, behavioral problems, confusion, hallucinations, self-injury, sleep disturbance and suicidal ideas. The patient is not nervous/anxious.   ? ? ?There is no immunization history on file for this patient. ?Pertinent  Health Maintenance Due  ?Topic Date Due  ? FOOT EXAM  Never done  ? OPHTHALMOLOGY EXAM  Never done  ? COLONOSCOPY (Pts 45-48yr Insurance coverage will need to be confirmed)  Never done  ? INFLUENZA VACCINE  09/04/2021  ? HEMOGLOBIN A1C  10/25/2021  ? PAP SMEAR-Modifier  Discontinued  ? ? ?  04/20/2021  ?  1:26 PM 04/30/2021  ?  3:05 PM 05/01/2021  ?  2:59 AM 05/14/2021  ?  2:33 PM 05/17/2021  ?  1:54 PM  ?Fall Risk  ?Falls in the past year? _0 ?Was there an injury with Fall? 0   0 0  ?Fall Risk Category Calculator _1 ?Fall Risk Category Moderate   Moderate Moderate  ?Patient Fall Risk Level Moderate fall risk Low fall risk High fall risk  Moderate fall risk  ?Patient at Risk for Falls Due to History of fall(s)    History

## 2021-05-17 NOTE — Patient Instructions (Signed)
Referral to Dr Katy Fitch [phone] for eye exam ordered on previous visit  ?

## 2021-05-18 ENCOUNTER — Telehealth: Payer: Self-pay | Admitting: *Deleted

## 2021-05-18 ENCOUNTER — Telehealth: Payer: Self-pay

## 2021-05-18 NOTE — Telephone Encounter (Signed)
Dinah came to me requesting to fax the PCS Form to [facility] for patient.  ?PCS Form faxed to [facility] Fax: [phone] ?

## 2021-05-18 NOTE — Telephone Encounter (Addendum)
Shelda Pal, ST called to extend speech therapy for patient 2x/week for 4 weeks. Verbal order given ?

## 2021-05-18 NOTE — Progress Notes (Signed)
This encounter was created in error - please disregard.

## 2021-05-22 ENCOUNTER — Other Ambulatory Visit: Payer: Self-pay | Admitting: Family Medicine

## 2021-05-22 ENCOUNTER — Other Ambulatory Visit: Payer: Self-pay | Admitting: Family

## 2021-05-22 ENCOUNTER — Telehealth: Payer: Self-pay | Admitting: *Deleted

## 2021-05-22 DIAGNOSIS — E1169 Type 2 diabetes mellitus with other specified complication: Secondary | ICD-10-CM

## 2021-05-22 DIAGNOSIS — G43019 Migraine without aura, intractable, without status migrainosus: Secondary | ICD-10-CM

## 2021-05-22 DIAGNOSIS — E1159 Type 2 diabetes mellitus with other circulatory complications: Secondary | ICD-10-CM

## 2021-05-22 DIAGNOSIS — I639 Cerebral infarction, unspecified: Secondary | ICD-10-CM

## 2021-05-22 DIAGNOSIS — E0590 Thyrotoxicosis, unspecified without thyrotoxic crisis or storm: Secondary | ICD-10-CM

## 2021-05-22 DIAGNOSIS — F419 Anxiety disorder, unspecified: Secondary | ICD-10-CM

## 2021-05-22 NOTE — Telephone Encounter (Signed)
Medication pended and routed to Marlowe Sax, NP for approval. Contract up to date ?

## 2021-05-22 NOTE — Telephone Encounter (Signed)
Requested medications are due for refill today.  no ? ?Requested medications are on the active medications list.  All but Zolpidem. ? ?Last refill. All of the medications have been refilled in March or April. Medications needing refills are: ?Amlodipine refilled 05/14/2021 #30 0 refills ?Lorazepam refilled 05/14/2021 #15 0 refills ?Nystatin refilled 04/26/2021 30g 0 refills. ?Possibly may need ?Lantus solostar 4/10/202315 mL 0 refills ?Novolog 04/18/2021  80m 0 refills ? ? ?All other medications have at least 30 days remaining. ? ?Future visit scheduled.   yes ? ?Notes to clinic. PCP is listed as DAdvice worker Pt has upcoming appts with both providers.  ? ? ? ?Requested Prescriptions  ?Pending Prescriptions Disp Refills  ? atorvastatin (LIPITOR) 80 MG tablet [Pharmacy Med Name: Atorvastatin Calcium 80 MG Tablet] 30 tablet 10  ?  Sig: TAKE 1 TABLET BY MOUTH AT BEDTIME  ?  ? Cardiovascular:  Antilipid - Statins Failed - 05/22/2021  3:36 PM  ?  ?  Failed - Lipid Panel in normal range within the last 12 months  ?  Cholesterol  ?Date Value Ref Range Status  ?04/24/2021 183 <200 mg/dL Final  ? ?LDL Cholesterol (Calc)  ?Date Value Ref Range Status  ?04/24/2021 108 (H) mg/dL (calc) Final  ?  Comment:  ?  Reference range: <100 ?.Marland Kitchen?Desirable range <100 mg/dL for primary prevention;   ?<70 mg/dL for patients with CHD or diabetic patients  ?with > or = 2 CHD risk factors. ?. ?LDL-C is now calculated using the Martin-Hopkins  ?calculation, which is a validated novel method providing  ?better accuracy than the Friedewald equation in the  ?estimation of LDL-C.  ?MCresenciano Genreet al. JAnnamaria Helling [id number];469(62: 2061-2068  ?(http://education.[employer].com/faq/FAQ164) ?  ? ?HDL  ?Date Value Ref Range Status  ?04/24/2021 49 (L) > OR = 50 mg/dL Final  ? ?Triglycerides  ?Date Value Ref Range Status  ?04/24/2021 143 <150 mg/dL Final  ? ?  ?  ?  Passed - Patient is not pregnant  ?  ?  Passed - Valid encounter within last 12 months  ?  Recent  Outpatient Visits   ? ?      ? 1 month ago Type 2 diabetes mellitus with hyperlipidemia ([place]  ? [place]  ? ?  ?  ?Future Appointments   ? ?        ? In 2 weeks SCamillia Herter NP [facility] ? In 1 month [place] [facility] [place] ? In 5 months Ngetich, [facility]elda Bucks [facility]  ? ?  ? ? ?  ?  ?  ? amLODipine (NORVASC) 5 MG tablet [Pharmacy Med Name: amLODIPine Besylate 5 MG Tablet] 30 tablet 10  ?  Sig: TAKE 1 TABLET BY MOUTH EVERY MORNING  ?  ? Cardiovascular: Calcium Channel Blockers 2 Failed - 05/22/2021  3:36 PM  ?  ?  Failed - Last BP in normal range  ?  BP Readings from Last 1 Encounters:  ?05/17/21 138/90  ?  ?  ?  ?  Passed - Last Heart Rate in normal range  ?  Pulse Readings from Last 1 Encounters:  ?05/17/21 96  ?  ?  ?  ?  Passed - Valid encounter within last 6 months  ?  Recent Outpatient Visits   ? ?      ? 1 month ago Type 2 diabetes  mellitus with hyperlipidemia ([place])  ? [place] Charlott Rakes, MD  ? ?  ?  ?Future Appointments   ? ?        ? In 2 weeks Camillia Herter, NP Primary Care at [facility]  ? In 1 month Charlott Rakes, MD [place]  ? In 5 months Ngetich, Nelda Bucks, NP [facility] and Adult Medicine  ? ?  ? ? ?  ?  ?  ? gabapentin (NEURONTIN) 100 MG capsule [Pharmacy Med Name: Gabapentin 100 MG Capsule] 90 capsule 10  ?  Sig: TAKE 1 CAPSULE BY MOUTH THREE TIMES DAILY (BREAKFAST, LUNCH, BEDTIME)  ?  ? Neurology: Anticonvulsants - gabapentin Passed - 05/22/2021  3:36 PM  ?  ?  Passed - Cr in normal range and within 360 days  ?  Creat  ?Date Value Ref Range Status  ?04/24/2021 0.64 0.50 - 0.99 mg/dL Final  ? ?Creatinine, Ser  ?Date Value Ref Range Status  ?04/30/2021 0.68 0.44 - 1.00 mg/dL Final  ?  ?  ?  ?  Passed - Completed PHQ-2 or PHQ-9 in the last 360 days  ?  ?  Passed - Valid  encounter within last 12 months  ?  Recent Outpatient Visits   ? ?      ? 1 month ago Type 2 diabetes mellitus with hyperlipidemia ([place])  ? [place] Charlott Rakes, MD  ? ?  ?  ?Future Appointments   ? ?        ? In 2 weeks Camillia Herter, NP Primary Care at [facility]  ? In 1 month Charlott Rakes, MD [place]  ? In 5 months Ngetich, Nelda Bucks, NP [facility] and Adult Medicine  ? ?  ? ? ?  ?  ?  ? lisinopril (ZESTRIL) 20 MG tablet [Pharmacy Med Name: Lisinopril 20 MG Tablet] 30 tablet 10  ?  Sig: TAKE 1 TABLET BY MOUTH EVERY MORNING  ?  ? Cardiovascular:  ACE Inhibitors Failed - 05/22/2021  3:36 PM  ?  ?  Failed - Last BP in normal range  ?  BP Readings from Last 1 Encounters:  ?05/17/21 138/90  ?  ?  ?  ?  Passed - Cr in normal range and within 180 days  ?  Creat  ?Date Value Ref Range Status  ?04/24/2021 0.64 0.50 - 0.99 mg/dL Final  ? ?Creatinine, Ser  ?Date Value Ref Range Status  ?04/30/2021 0.68 0.44 - 1.00 mg/dL Final  ?  ?  ?  ?  Passed - K in normal range and within 180 days  ?  Potassium  ?Date Value Ref Range Status  ?04/30/2021 4.1 3.5 - 5.1 mmol/L Final  ?  ?  ?  ?  Passed - Patient is not pregnant  ?  ?  Passed - Valid encounter within last 6 months  ?  Recent Outpatient Visits   ? ?      ? 1 month ago Type 2 diabetes mellitus with hyperlipidemia ([place])  ? [place] Charlott Rakes, MD  ? ?  ?  ?Future Appointments   ? ?        ? In 2 weeks Camillia Herter, NP Primary Care at [facility]  ? In 1 month Charlott Rakes, MD [place]  ? In 5  months Ngetich, Nelda Bucks, NP [facility] and Adult Medicine  ? ?  ? ? ?  ?  ?  ? LANTUS SOLOSTAR 100 UNIT/ML Solostar Pen [Pharmacy Med Name: Lantus SoloStar 100 UNIT/ML Solution pen-injector] 9 mL 10  ?  Sig: INJECT 29 UNITS SUBCUTANEOUSLY AT BEDTIME  ?  ? Endocrinology:  Diabetes - Insulins Failed - 05/22/2021  3:36  PM  ?  ?  Failed - HBA1C is between 0 and 7.9 and within 180 days  ?  Hgb A1c MFr Bld  ?Date Value Ref Range Status  ?04/24/2021 8.4 (H) <5.7 % of total Hgb Final  ?  Comment:  ?  For someone without known diabetes, a hemoglobin A1c ?value of 6.5% or greater indicates that they may have  ?diabetes and this should be confirmed with a follow-up  ?test. ?. ?For someone with known diabetes, a value <7% indicates  ?that their diabetes is well controlled and a value  ?greater than or equal to 7% indicates suboptimal  ?control. A1c targets should be individualized based on  ?duration of diabetes, age, comorbid conditions, and  ?other considerations. ?. ?Currently, no consensus exists regarding use of ?hemoglobin A1c for diagnosis of diabetes for children. ?. ?  ?  ?  ?  ?  Passed - Valid encounter within last 6 months  ?  Recent Outpatient Visits   ? ?      ? 1 month ago Type 2 diabetes mellitus with hyperlipidemia ([place])  ? [place] Charlott Rakes, MD  ? ?  ?  ?Future Appointments   ? ?        ? In 2 weeks Camillia Herter, NP Primary Care at [facility]  ? In 1 month Charlott Rakes, MD [place]  ? In 5 months Ngetich, Nelda Bucks, NP [facility] and Adult Medicine  ? ?  ? ? ?  ?  ?  ? tamsulosin (FLOMAX) 0.4 MG CAPS capsule [Pharmacy Med Name: Tamsulosin HCl 0.4 MG Capsule] 30 capsule 10  ?  Sig: TAKE 1 CAPSULE BY MOUTH AT BEDTIME  ?  ? Urology: Alpha-Adrenergic Blocker Failed - 05/22/2021  3:36 PM  ?  ?  Failed - PSA in normal range and within 360 days  ?  No results found for: LABPSA, PSA, PSA1, ULTRAPSA  ?  ?  ?  Failed - Last BP in normal range  ?  BP Readings from Last 1 Encounters:  ?05/17/21 138/90  ?  ?  ?  ?  Passed - Valid encounter within last 12 months  ?  Recent Outpatient Visits   ? ?      ? 1 month ago Type 2 diabetes mellitus with hyperlipidemia ([place])  ? [place] Charlott Rakes, MD  ? ?  ?  ?Future  Appointments   ? ?        ? In 2 weeks Camillia Herter, NP [facility]  ? In 1 month Charlott Rakes, MD [place]  ? In 5 months Ngetich, Nelda Bucks, NP [place]

## 2021-05-22 NOTE — Telephone Encounter (Signed)
Patient called and verbally requested her OV notes from [facility] to be faxed to [place]. Stated that she is trying to get placement to a Nursing Home and they need OV notes faxed to them.  ? ?Patient gave me the number to call to speak to her Case Worker [phone] ? ?Called and spoke with Reggy Eye with [facility] and she requested the OV notes from March to present to be faxed to her Fax:[phone].  ? ?Per patient's request, OV notes faxed.  ?

## 2021-05-23 ENCOUNTER — Other Ambulatory Visit: Payer: Self-pay | Admitting: Family

## 2021-05-23 ENCOUNTER — Ambulatory Visit: Payer: Medicaid Other | Admitting: Neurology

## 2021-05-23 VITALS — BP 126/62 | HR 76

## 2021-05-23 DIAGNOSIS — J383 Other diseases of vocal cords: Secondary | ICD-10-CM

## 2021-05-23 DIAGNOSIS — G8110 Spastic hemiplegia affecting unspecified side: Secondary | ICD-10-CM

## 2021-05-23 DIAGNOSIS — I639 Cerebral infarction, unspecified: Secondary | ICD-10-CM

## 2021-05-23 DIAGNOSIS — E0590 Thyrotoxicosis, unspecified without thyrotoxic crisis or storm: Secondary | ICD-10-CM

## 2021-05-23 DIAGNOSIS — R471 Dysarthria and anarthria: Secondary | ICD-10-CM | POA: Diagnosis not present

## 2021-05-23 DIAGNOSIS — I152 Hypertension secondary to endocrine disorders: Secondary | ICD-10-CM

## 2021-05-23 DIAGNOSIS — Z8673 Personal history of transient ischemic attack (TIA), and cerebral infarction without residual deficits: Secondary | ICD-10-CM | POA: Diagnosis not present

## 2021-05-23 DIAGNOSIS — E1169 Type 2 diabetes mellitus with other specified complication: Secondary | ICD-10-CM

## 2021-05-23 DIAGNOSIS — F419 Anxiety disorder, unspecified: Secondary | ICD-10-CM

## 2021-05-23 MED ORDER — EZETIMIBE 10 MG PO TABS: 10.0000 mg | ORAL_TABLET | Freq: Every day | ORAL | 1 refills | Status: DC

## 2021-05-23 NOTE — Telephone Encounter (Signed)
Ambien not on Patient medication list.will refill all other medication except Ambien.  ?

## 2021-05-23 NOTE — Patient Instructions (Signed)
I had a long d/w patient and her husband about her history of multiple strokes and spastic dysphonia, hemiplegia and gait difficulties risk for recurrent stroke/TIAs, personally independently reviewed imaging studies and stroke evaluation results and answered questions.Continue Plavix (clopidogrel) 75 mg daily  for secondary stroke prevention and maintain strict control of hypertension with blood pressure goal below 130/90, diabetes with hemoglobin A1c goal below 6.5% and lipids with LDL cholesterol goal below 70 mg/dL. I also advised the patient to eat a healthy diet with plenty of whole grains, cereals, fruits and vegetables, exercise regularly and maintain ideal body weight .recommend adding Zetia 10 mg daily for optimal cholesterol control as her LDL is not at goal despite maximum dose of statin.  Continue follow-up with rehab physician for Botox injections and also consider Vivistim program .refer to sleep medicine physician for evaluation and treatment for sleep apnea.  Followup in the future with my nurse practitioner in 3 months or call earlier if necessary. ?Stroke Prevention ?Some medical conditions and behaviors can lead to a higher chance of having a stroke. You can help prevent a stroke by eating healthy, exercising, not smoking, and managing any medical conditions you have. ?Stroke is a leading cause of functional impairment. Primary prevention is particularly important because a majority of strokes are first-time events. Stroke changes the lives of not only those who experience a stroke but also their family and other caregivers. ?How can this condition affect me? ?A stroke is a medical emergency and should be treated right away. A stroke can lead to brain damage and can sometimes be life-threatening. If a person gets medical treatment right away, there is a better chance of surviving and recovering from a stroke. ?What can increase my risk? ?The following medical conditions may increase your risk of  a stroke: ?Cardiovascular disease. ?High blood pressure (hypertension). ?Diabetes. ?High cholesterol. ?Sickle cell disease. ?Blood clotting disorders (hypercoagulable state). ?Obesity. ?Sleep disorders (obstructive sleep apnea). ?Other risk factors include: ?Being older than age 4. ?Having a history of blood clots, stroke, or mini-stroke (transient ischemic attack, TIA). ?Genetic factors, such as race, ethnicity, or a family history of stroke. ?Smoking cigarettes or using other tobacco products. ?Taking birth control pills, especially if you also use tobacco. ?Heavy use of alcohol or drugs, especially cocaine and methamphetamine. ?Physical inactivity. ?What actions can I take to prevent this? ?Manage your health conditions ?High cholesterol levels. ?Eating a healthy diet is important for preventing high cholesterol. If cholesterol cannot be managed through diet alone, you may need to take medicines. ?Take any prescribed medicines to control your cholesterol as told by your health care provider. ?Hypertension. ?To reduce your risk of stroke, try to keep your blood pressure below 130/80. ?Eating a healthy diet and exercising regularly are important for controlling blood pressure. If these steps are not enough to manage your blood pressure, you may need to take medicines. ?Take any prescribed medicines to control hypertension as told by your health care provider. ?Ask your health care provider if you should monitor your blood pressure at home. ?Have your blood pressure checked every year, even if your blood pressure is normal. Blood pressure increases with age and some medical conditions. ?Diabetes. ?Eating a healthy diet and exercising regularly are important parts of managing your blood sugar (glucose). If your blood sugar cannot be managed through diet and exercise, you may need to take medicines. ?Take any prescribed medicines to control your diabetes as told by your health care provider. ?Get evaluated for  obstructive  sleep apnea. Talk to your health care provider about getting a sleep evaluation if you snore a lot or have excessive sleepiness. ?Make sure that any other medical conditions you have, such as atrial fibrillation or atherosclerosis, are managed. ?Nutrition ?Follow instructions from your health care provider about what to eat or drink to help manage your health condition. These instructions may include: ?Reducing your daily calorie intake. ?Limiting how much salt (sodium) you use to 1,500 milligrams (mg) each day. ?Using only healthy fats for cooking, such as olive oil, canola oil, or sunflower oil. ?Eating healthy foods. You can do this by: ?Choosing foods that are high in fiber, such as whole grains, and fresh fruits and vegetables. ?Eating at least 5 servings of fruits and vegetables a day. Try to fill one-half of your plate with fruits and vegetables at each meal. ?Choosing lean protein foods, such as lean cuts of meat, poultry without skin, fish, tofu, beans, and nuts. ?Eating low-fat dairy products. ?Avoiding foods that are high in sodium. This can help lower blood pressure. ?Avoiding foods that have saturated fat, trans fat, and cholesterol. This can help prevent high cholesterol. ?Avoiding processed and prepared foods. ?Counting your daily carbohydrate intake. ? ?Lifestyle ?If you drink alcohol: ?Limit how much you have to: ?0-1 drink a day for women who are not pregnant. ?0-2 drinks a day for men. ?Know how much alcohol is in your drink. In the [place] one drink equals one 12 oz bottle of beer (364m), one 5 oz glass of wine (1452m, or one 1? oz glass of hard liquor (4451m ?Do not use any products that contain nicotine or tobacco. These products include cigarettes, chewing tobacco, and vaping devices, such as e-cigarettes. If you need help quitting, ask your health care provider. ?Avoid secondhand smoke. ?Do not use drugs. ?Activity ? ?Try to stay at a healthy weight. ?Get at least 30 minutes of  exercise on most days, such as: ?Fast walking. ?Biking. ?Swimming. ?Medicines ?Take over-the-counter and prescription medicines only as told by your health care provider. Aspirin or blood thinners (antiplatelets or anticoagulants) may be recommended to reduce your risk of forming blood clots that can lead to stroke. ?Avoid taking birth control pills. Talk to your health care provider about the risks of taking birth control pills if: ?You are over 35 50ars old. ?You smoke. ?You get very bad headaches. ?You have had a blood clot. ?Where to find more information ?American Stroke Association: www.strokeassociation.org ?Get help right away if: ?You or a loved one has any symptoms of a stroke. "BE FAST" is an easy way to remember the main warning signs of a stroke: ?B - Balance. Signs are dizziness, sudden trouble walking, or loss of balance. ?E - Eyes. Signs are trouble seeing or a sudden change in vision. ?F - Face. Signs are sudden weakness or numbness of the face, or the face or eyelid drooping on one side. ?A - Arms. Signs are weakness or numbness in an arm. This happens suddenly and usually on one side of the body. ?S - Speech. Signs are sudden trouble speaking, slurred speech, or trouble understanding what people say. ?T - Time. Time to call emergency services. Write down what time symptoms started. ?You or a loved one has other signs of a stroke, such as: ?A sudden, severe headache with no known cause. ?Nausea or vomiting. ?Seizure. ?These symptoms may represent a serious problem that is an emergency. Do not wait to see if the symptoms will go away. Get medical  help right away. Call your local emergency services (911 in the [place] Do not drive yourself to the hospital. ?Summary ?You can help to prevent a stroke by eating healthy, exercising, not smoking, limiting alcohol intake, and managing any medical conditions you may have. ?Do not use any products that contain nicotine or tobacco. These include cigarettes,  chewing tobacco, and vaping devices, such as e-cigarettes. If you need help quitting, ask your health care provider. ?Remember "BE FAST" for warning signs of a stroke. Get help right away if you or a loved one

## 2021-05-23 NOTE — Progress Notes (Signed)
?[facility] ?[address] ?[place]. [place] [address] ?[phone] ? ?     OFFICE CONSULT NOTE ? ?[name] ?Date of Birth:  [birth year] ?Medical Record Number:  [id number]  ? ?Referring MD: Su Monks ? ?Reason for Referral: Stroke ? ?HPI: Lori [name] is a 51 year old lady seen today for initial office consultation visit.  She is accompanied by husband.  History is obtained from them and review of electronic medical records and personally reviewed pertinent available imaging films in PACS.  She has past medical history of diabetes, her hypertension, hyperlipidemia, coronary artery disease and multiple strokes.  She states she had a for stroke in 2014 when she was living in [place].  She was on hormones at that time to reduce the fibroids.  She was asked to stop those and she had significant residual hemiparesis since then.  Since then she had 11 TIAs  In 2014 ?Marland Kitchen  In January 21 she was admitted to [facility] in [place] with a big stroke which involved her cerebellum and brainstem and resulted in her voice and speech being affected.  In October 2021 she was admitted in [place] at the time of the stroke which affected her balance.  Since then she has been wheelchair-bound and has been unable to walk.  She has noted significant worsening of her left sided weakness since her strokes in 2021.  She is currently getting home physical and Occupational Therapy.  She is unable to walk even with assistance but is able to stand.  She was admitted to [facility] in December 2022.  3 with inability to speak and worsening weakness likely in the setting of dehydration from vomiting and GI illness..  CT scan of the head showed no acute infarct but showed remote age infarct in the left cerebellar hemisphere as well as left frontal cortex bilateral basal ganglia lacunar infarcts and extensive changes of small vessel  disease.  MRI scan of the brain also showed no acute infarct but showed multiple remote infarcts involving bilateral cerebral hemispheres, basal ganglia, thalami and left cerebellum.  There are multiple small chronic microhemorrhages noted suggestive of chronic small vessel disease.  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  There is also moderate to severe flow-limiting stenosis at right carotid siphon.  Patient is presently on Plavix for stroke prevention which is tolerating well without bruising or bleeding.  She states her blood pressure is under good control on Norvasc.  She is also on Lipitor and last lipid profile in 04/24/2021 was yet elevated at 108.  Hemoglobin A1c was 8.4.  Patient is getting currently home physical and Occupational Therapy and may eventually need outpatient therapy as well. ?ROS:   ?14 system review of systems is positive for dysarthria, weakness, gait difficulty, wheelchair-bound all other systems negative ? ?PMH:  ?Past Medical History:  ?Diagnosis Date  ? CAD (coronary artery disease)   ? Depression   ? Diabetes mellitus without complication ([place])   ? History of CT scan   ? History of mammogram   ? History of MRI   ? Hypertension   ? Pheochromocytoma   ? Pheochromocytoma   ? Stroke [facility])   ? Thyroid disease   ? ? ?Social History:  ?Social History  ? ?Socioeconomic History  ? Marital status:  Divorced  ?  Spouse name: Not on file  ? Number of children: Not on file  ? Years of education: Not on file  ? Highest education level: Not on file  ?Occupational History  ? Not on file  ?Tobacco Use  ? Smoking status: Never  ? Smokeless tobacco: Never  ?Vaping Use  ? Vaping Use: Never used  ?Substance and Sexual Activity  ? Alcohol use: Not Currently  ? Drug use: Never  ? Sexual activity: Not on file  ?Other Topics Concern  ? Not  on file  ?Social History Narrative  ? Tobacco use, amount per day now: Never  ? Past tobacco use, amount per day: Never  ? How many years did you use tobacco: Never  ? Alcohol use (drinks per week): Not Currently.  ? Diet: Eat out a lot.   ? Do you drink/eat things with caffeine: Sweet Tea  ? Marital status:   Married                               What year were you married? 2000  ? Do you live in a house, apartment, assisted living, condo, trailer, etc.? House  ? Is it one or more stories? 1  ? How many persons live in your home? 2  ? Do you have pets in your home?( please list) No  ? Highest Level of education completed? Bachelors Degree.  ? Current or past profession: Location manager, [employer].  ? Do you exercise?   No                               Type and how often?  ? Do you have a living will? No  ? Do you have a DNR form?       No                            If not, do you want to discuss one?  ? Do you have signed POA/HPOA forms?   No                     If so, please bring to you appointment  ?   ? Do you have any difficulty bathing or dressing yourself? Yes  ? Do you have any difficulty preparing food or eating? Yes  ? Do you have any difficulty managing your medications? Yes  ? Do you have any difficulty managing your finances? No  ? Do you have any difficulty affording your medications? Yes  ? ?Social Determinants of Health  ? ?Financial Resource Strain: Not on file  ?Food Insecurity: Not on file  ?Transportation Needs: Not on file  ?Physical Activity: Not on file  ?Stress: Not on file  ?Social Connections: Not on file  ?Intimate Partner Violence: Not on file  ? ? ?Medications:   ?Current Outpatient Medications on File Prior to Visit  ?Medication Sig Dispense Refill  ? acetaminophen (TYLENOL) 325 MG tablet Take 2 tablets (650 mg total) by mouth every 6 (six) hours as needed for mild pain or headache.    ? amLODipine (NORVASC) 10 MG tablet Take 1 tablet (10 mg  total) by mouth daily. For elevated BP- please call PCP for refills- in future 30 tablet 0  ? blood glucose meter kit and  supplies KIT Dispense based on patient and insurance preference. Use up to four times daily as directed. 1 each 0  ? butalbital-acetaminophen-caffeine (FIORICET) 50-325-40 MG tablet TAKE 1 TABLET BY MOUTH EVERY 6 HOURS AS NEEDED FOR MIGRANE 120 tablet 0  ? cephALEXin (KEFLEX) 500 MG capsule Take 1 capsule (500 mg total) by mouth 4 (four) times daily. 20 capsule 0  ? Continuous Blood Gluc Receiver (FREESTYLE LIBRE 2 READER) DEVI Use to check blood sugar 3 times daily. 1 each 3  ? Continuous Blood Gluc Sensor (FREESTYLE LIBRE 2 SENSOR) MISC Use to check blood sugar 3 times daily. 1 each 3  ? diclofenac Sodium (VOLTAREN) 1 % GEL Apply 4 g topically 4 (four) times daily. 200 g 0  ? Insulin Pen Needle 32G X 4 MM MISC Use to inject insulin 4 times daily as directed. 200 each 0  ? lip balm (CARMEX) ointment Apply topically as needed for lip care. 7 g 0  ? loratadine (CLARITIN) 10 MG tablet Take 1 tablet (10 mg total) by mouth daily. 30 tablet 0  ? melatonin 3 MG TABS tablet Take 3 tablets (9 mg total) by mouth at bedtime. 90 tablet 3  ? metoCLOPramide (REGLAN) 10 MG tablet Take 1 tablet (10 mg total) by mouth 3 (three) times daily before meals. 90 tablet 0  ? polyethylene glycol powder (GLYCOLAX/MIRALAX) 17 GM/SCOOP powder Take 17 g by mouth 2 (two) times daily. 476 g 0  ? senna (SENOKOT) 8.6 MG TABS tablet Take 2 tablets (17.2 mg total) by mouth daily AND 1 tablet (8.6 mg total) at bedtime. 90 tablet 0  ? ?No current facility-administered medications on file prior to visit.  ? ? ?Allergies:   ?Allergies  ?Allergen Reactions  ? Sumatriptan Anaphylaxis  ? Contrast Media [Iodinated Contrast Media] Swelling  ? Penicillins Swelling  ?  Mouth swells up and eyes swollen shut  ? ? ?Physical Exam ?General: Obese middle-aged Caucasian lady, seated, in no evident distress ?Head: head normocephalic and atraumatic.    ?Neck: supple with no carotid or supraclavicular bruits ?Cardiovascular: regular rate and contractures of the left hand fingers, no murmurs ?Musculoskeletal: no deformity ?Skin:  no rash/petichiae ?Vasc

## 2021-05-28 ENCOUNTER — Ambulatory Visit: Payer: Medicare Other | Admitting: Podiatry

## 2021-05-30 ENCOUNTER — Telehealth: Payer: Self-pay

## 2021-05-30 NOTE — Telephone Encounter (Signed)
Spoke with Ellis Parents from [facility] requesting verbal orders for a new nursing home evaluation. Verbal orders given. Ellis Parents stated that the patient is refusing to take her Senokot because she fears it will make her use the restroom too often. She stated that she goes every 2-3 days. Also, that the patients blood pressure has been elevated, recorded value was 140/100. Lastly, Ellis Parents stated that the patient has not been consistent with taking their medications.  ? ?Please advise.  ?

## 2021-05-30 NOTE — Telephone Encounter (Signed)
-   encourage to take medication as prescribe.continue to monitor Blood pressure since not taking medication as directed. ?May take Senokot as needed daily ?Okay to give Home health verbal orders. ?

## 2021-05-31 NOTE — Telephone Encounter (Signed)
Noted  

## 2021-05-31 NOTE — Telephone Encounter (Signed)
Spoke to Bliss Corner from [facility] and she stated that the patient is content with having a bowel movement every 2-3 days. Patient stated that she does want to be changed all the time and does not want to be on many medications.  ? ?Please advise.  ?

## 2021-05-31 NOTE — Telephone Encounter (Signed)
Medication dose change per Ngetich, Nelda Bucks, NP order.  ?

## 2021-06-01 ENCOUNTER — Telehealth: Payer: Self-pay

## 2021-06-01 DIAGNOSIS — E871 Hypo-osmolality and hyponatremia: Secondary | ICD-10-CM

## 2021-06-01 DIAGNOSIS — F32A Depression, unspecified: Secondary | ICD-10-CM

## 2021-06-01 DIAGNOSIS — G43909 Migraine, unspecified, not intractable, without status migrainosus: Secondary | ICD-10-CM

## 2021-06-01 DIAGNOSIS — I10 Essential (primary) hypertension: Secondary | ICD-10-CM

## 2021-06-01 DIAGNOSIS — E1169 Type 2 diabetes mellitus with other specified complication: Secondary | ICD-10-CM

## 2021-06-01 DIAGNOSIS — Z993 Dependence on wheelchair: Secondary | ICD-10-CM

## 2021-06-01 DIAGNOSIS — Z79891 Long term (current) use of opiate analgesic: Secondary | ICD-10-CM

## 2021-06-01 DIAGNOSIS — I69320 Aphasia following cerebral infarction: Secondary | ICD-10-CM | POA: Diagnosis not present

## 2021-06-01 DIAGNOSIS — I2510 Atherosclerotic heart disease of native coronary artery without angina pectoris: Secondary | ICD-10-CM

## 2021-06-01 DIAGNOSIS — I69354 Hemiplegia and hemiparesis following cerebral infarction affecting left non-dominant side: Secondary | ICD-10-CM | POA: Diagnosis not present

## 2021-06-01 DIAGNOSIS — Z9181 History of falling: Secondary | ICD-10-CM

## 2021-06-01 DIAGNOSIS — E0590 Thyrotoxicosis, unspecified without thyrotoxic crisis or storm: Secondary | ICD-10-CM

## 2021-06-01 DIAGNOSIS — E1165 Type 2 diabetes mellitus with hyperglycemia: Secondary | ICD-10-CM | POA: Diagnosis not present

## 2021-06-01 DIAGNOSIS — I152 Hypertension secondary to endocrine disorders: Secondary | ICD-10-CM

## 2021-06-01 DIAGNOSIS — Z7902 Long term (current) use of antithrombotics/antiplatelets: Secondary | ICD-10-CM

## 2021-06-01 DIAGNOSIS — F419 Anxiety disorder, unspecified: Secondary | ICD-10-CM

## 2021-06-01 DIAGNOSIS — Z85841 Personal history of malignant neoplasm of brain: Secondary | ICD-10-CM

## 2021-06-01 DIAGNOSIS — E1143 Type 2 diabetes mellitus with diabetic autonomic (poly)neuropathy: Secondary | ICD-10-CM | POA: Diagnosis not present

## 2021-06-01 DIAGNOSIS — Z794 Long term (current) use of insulin: Secondary | ICD-10-CM

## 2021-06-01 DIAGNOSIS — E785 Hyperlipidemia, unspecified: Secondary | ICD-10-CM

## 2021-06-01 DIAGNOSIS — E1110 Type 2 diabetes mellitus with ketoacidosis without coma: Secondary | ICD-10-CM

## 2021-06-01 NOTE — Telephone Encounter (Signed)
[facility] with instructions. No answer. Detailed voicemail was left with office call back number. Medication list updated. ?

## 2021-06-01 NOTE — Telephone Encounter (Signed)
-   Increase Lantus from 29 units SQ to 32 units SQ at bedtime for high blood sugars.  ? ?- Increase Lisinopril from 20 mg tablet to 40 mg tablet one by mouth daily for high blood pressure  ?

## 2021-06-01 NOTE — Telephone Encounter (Signed)
Lori [name] who is the Physical Brewing technologist from [facility] called and states the following. Patient fasting blood sugar this morning was 254, blood pressure before treatment 142/92, and after treatment 142/108. She said to call her back if we have any questions or concerns. Message routed to PCP Ngetich, Nelda Bucks, NP.  ?

## 2021-06-02 NOTE — Progress Notes (Signed)
Erroneous encounter

## 2021-06-06 NOTE — Telephone Encounter (Signed)
Noted  

## 2021-06-06 NOTE — Telephone Encounter (Signed)
[name] called and left voicemail on clinical intake line stating that she received message from Friday 06/01/2021 with medication adjustments. She wanted to inform you that patient has been Non Compliant and she has informed patients husband as well. She wanted you to call her if you have any questions or concerns. Message routed to PCP Ngetich, Nelda Bucks, NP .  ?

## 2021-06-08 ENCOUNTER — Encounter: Payer: Medicaid Other | Admitting: Family

## 2021-06-08 DIAGNOSIS — Z7689 Persons encountering health services in other specified circumstances: Secondary | ICD-10-CM

## 2021-06-11 ENCOUNTER — Other Ambulatory Visit: Payer: Self-pay

## 2021-06-11 ENCOUNTER — Emergency Department (HOSPITAL_COMMUNITY)
Admission: EM | Admit: 2021-06-11 | Discharge: 2021-06-11 | Disposition: A | Discharge status: Home / Self Care | Payer: Medicaid Other | Attending: Emergency Medicine | Admitting: Emergency Medicine

## 2021-06-11 ENCOUNTER — Emergency Department (HOSPITAL_COMMUNITY): Payer: Medicaid Other

## 2021-06-11 ENCOUNTER — Encounter (HOSPITAL_COMMUNITY): Payer: Self-pay | Admitting: Emergency Medicine

## 2021-06-11 DIAGNOSIS — Z7902 Long term (current) use of antithrombotics/antiplatelets: Secondary | ICD-10-CM | POA: Insufficient documentation

## 2021-06-11 DIAGNOSIS — K5900 Constipation, unspecified: Secondary | ICD-10-CM | POA: Insufficient documentation

## 2021-06-11 DIAGNOSIS — R531 Weakness: Secondary | ICD-10-CM | POA: Insufficient documentation

## 2021-06-11 DIAGNOSIS — E119 Type 2 diabetes mellitus without complications: Secondary | ICD-10-CM | POA: Insufficient documentation

## 2021-06-11 DIAGNOSIS — I10 Essential (primary) hypertension: Secondary | ICD-10-CM | POA: Insufficient documentation

## 2021-06-11 DIAGNOSIS — R079 Chest pain, unspecified: Secondary | ICD-10-CM | POA: Insufficient documentation

## 2021-06-11 DIAGNOSIS — R112 Nausea with vomiting, unspecified: Secondary | ICD-10-CM | POA: Diagnosis present

## 2021-06-11 DIAGNOSIS — Z79899 Other long term (current) drug therapy: Secondary | ICD-10-CM | POA: Diagnosis not present

## 2021-06-11 DIAGNOSIS — R262 Difficulty in walking, not elsewhere classified: Secondary | ICD-10-CM | POA: Insufficient documentation

## 2021-06-11 DIAGNOSIS — Z794 Long term (current) use of insulin: Secondary | ICD-10-CM | POA: Insufficient documentation

## 2021-06-11 DIAGNOSIS — R519 Headache, unspecified: Secondary | ICD-10-CM | POA: Insufficient documentation

## 2021-06-11 DIAGNOSIS — R000 Tachycardia, unspecified: Secondary | ICD-10-CM

## 2021-06-11 DIAGNOSIS — I2510 Atherosclerotic heart disease of native coronary artery without angina pectoris: Secondary | ICD-10-CM | POA: Diagnosis not present

## 2021-06-11 DIAGNOSIS — R630 Anorexia: Secondary | ICD-10-CM | POA: Insufficient documentation

## 2021-06-11 DIAGNOSIS — R202 Paresthesia of skin: Secondary | ICD-10-CM | POA: Diagnosis not present

## 2021-06-11 DIAGNOSIS — N390 Urinary tract infection, site not specified: Secondary | ICD-10-CM

## 2021-06-11 LAB — T4, FREE: Free T4: 1.13 ng/dL — ABNORMAL HIGH (ref 0.61–1.12)

## 2021-06-11 LAB — COMPREHENSIVE METABOLIC PANEL
ALT: 19 U/L (ref 0–44)
AST: 17 U/L (ref 15–41)
Albumin: 4.1 g/dL (ref 3.5–5.0)
Alkaline Phosphatase: 124 U/L (ref 38–126)
Anion gap: 11 (ref 5–15)
BUN: 12 mg/dL (ref 6–20)
CO2: 25 mmol/L (ref 22–32)
Calcium: 9.6 mg/dL (ref 8.9–10.3)
Chloride: 102 mmol/L (ref 98–111)
Creatinine, Ser: 0.7 mg/dL (ref 0.44–1.00)
GFR, Estimated: >60 mL/min (ref >60)
Glucose, Bld: 254 mg/dL — ABNORMAL HIGH (ref 70–99)
Potassium: 4.1 mmol/L (ref 3.5–5.1)
Sodium: 138 mmol/L (ref 135–145)
Total Bilirubin: 0.8 mg/dL (ref 0.3–1.2)
Total Protein: 8.5 g/dL — ABNORMAL HIGH (ref 6.5–8.1)

## 2021-06-11 LAB — CBC
HCT: 45.6 % (ref 36.0–46.0)
Hemoglobin: 15.7 g/dL — ABNORMAL HIGH (ref 12.0–15.0)
MCH: 26.4 pg (ref 26.0–34.0)
MCHC: 34.4 g/dL (ref 30.0–36.0)
MCV: 76.6 fL — ABNORMAL LOW (ref 80.0–100.0)
Platelets: 405 10*3/uL — ABNORMAL HIGH (ref 150–400)
RBC: 5.95 MIL/uL — ABNORMAL HIGH (ref 3.87–5.11)
RDW: 14.4 % (ref 11.5–15.5)
WBC: 11.7 10*3/uL — ABNORMAL HIGH (ref 4.0–10.5)
nRBC: 0 % (ref 0.0–0.2)

## 2021-06-11 LAB — TROPONIN I (HIGH SENSITIVITY)
Troponin I (High Sensitivity): 4 ng/L (ref <18)
Troponin I (High Sensitivity): 4 ng/L (ref <18)

## 2021-06-11 LAB — URINALYSIS, ROUTINE W REFLEX MICROSCOPIC
Bilirubin Urine: NEGATIVE
Glucose, UA: >=500 mg/dL — AB
Hgb urine dipstick: NEGATIVE
Ketones, ur: 5 mg/dL — AB
Nitrite: NEGATIVE
Protein, ur: NEGATIVE mg/dL
Specific Gravity, Urine: 1.011 (ref 1.005–1.030)
pH: 5 (ref 5.0–8.0)

## 2021-06-11 LAB — BLOOD GAS, VENOUS
Acid-Base Excess: 2.3 mmol/L — ABNORMAL HIGH (ref 0.0–2.0)
Bicarbonate: 26.5 mmol/L (ref 20.0–28.0)
O2 Saturation: 85.8 %
Patient temperature: 37
pCO2, Ven: 39 mmHg — ABNORMAL LOW (ref 44–60)
pH, Ven: 7.44 — ABNORMAL HIGH (ref 7.25–7.43)
pO2, Ven: 51 mmHg — ABNORMAL HIGH (ref 32–45)

## 2021-06-11 LAB — TSH: TSH: 1.072 u[IU]/mL (ref 0.350–4.500)

## 2021-06-11 LAB — PREGNANCY, URINE: Preg Test, Ur: NEGATIVE

## 2021-06-11 LAB — CBG MONITORING, ED: Glucose-Capillary: 278 mg/dL — ABNORMAL HIGH (ref 70–99)

## 2021-06-11 LAB — MAGNESIUM: Magnesium: 1.6 mg/dL — ABNORMAL LOW (ref 1.7–2.4)

## 2021-06-11 LAB — LIPASE, BLOOD: Lipase: 21 U/L (ref 11–51)

## 2021-06-11 LAB — I-STAT BETA HCG BLOOD, ED (MC, WL, AP ONLY): I-stat hCG, quantitative: 8.3 m[IU]/mL — ABNORMAL HIGH (ref <5)

## 2021-06-11 LAB — BETA-HYDROXYBUTYRIC ACID: Beta-Hydroxybutyric Acid: 0.59 mmol/L — ABNORMAL HIGH (ref 0.05–0.27)

## 2021-06-11 IMAGING — MR MR HEAD W/O CM
10 series · 44 of 48 positions shown · non-contrast
Comparison: CT head from the same day.

CLINICAL DATA: Neuro deficit, acute, stroke suspected

EXAM:
MRI HEAD WITHOUT CONTRAST
TECHNIQUE: Multiplanar, multiecho pulse sequences of the brain and surrounding
structures were obtained without intravenous contrast.

[Series 5: dwi_tracew · axial · 3.0mm · 1.08mm/px · z∈[-59,+90]mm · 8 of 102 slices shown]
[im 1/102]
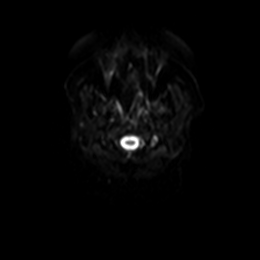
[im 19/102]
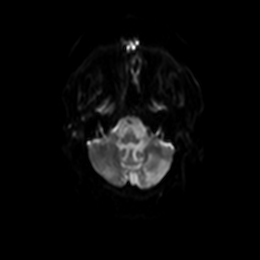
[im 28/102]
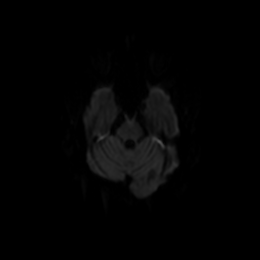
[im 46/102]
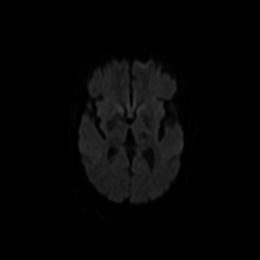
[im 56/102]
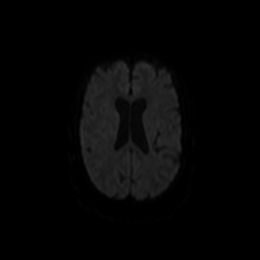
[im 74/102]
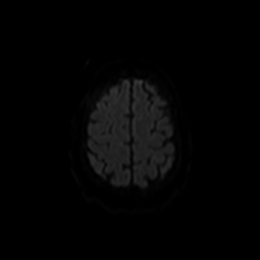
[im 83/102]
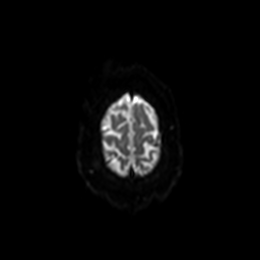
[im 102/102]
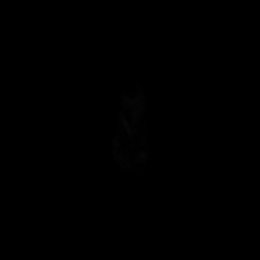

[Series 6: dwi_adc · axial · 3.0mm · 1.08mm/px · z∈[-59,+90]mm · 5 of 51 slices shown]
[im 1/51]
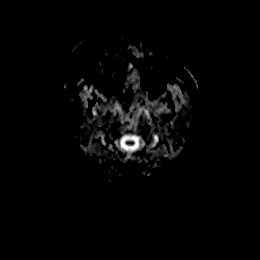
[im 13/51]
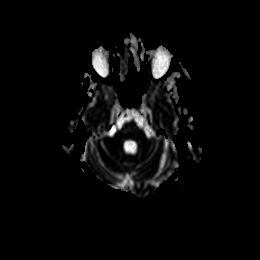
[im 26/51]
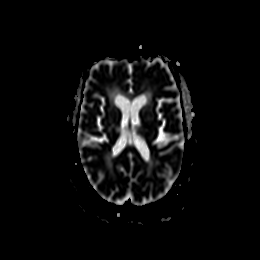
[im 38/51]
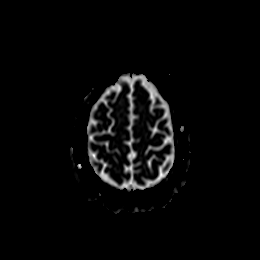
[im 51/51]
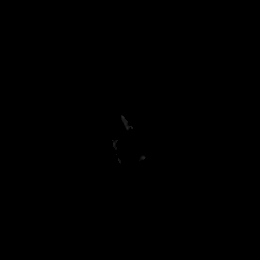

[Series 7: T2 · sagittal · 5.0mm · 0.47mm/px · 2 of 22 slices shown (1 of 3)]
[im 1/22]
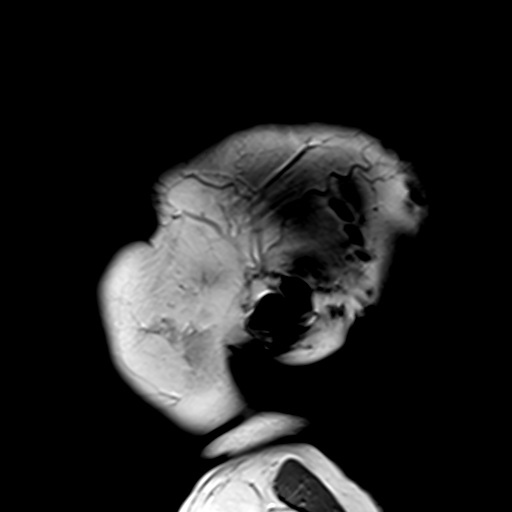
[im 22/22]
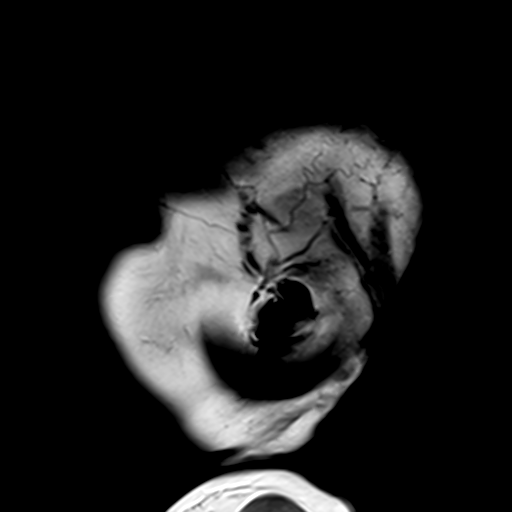

[Series 8: T2 · axial · 5.0mm · 0.45mm/px · z∈[-55,+81]mm · 2 of 22 slices shown (2 of 3)]
[im 1/22]
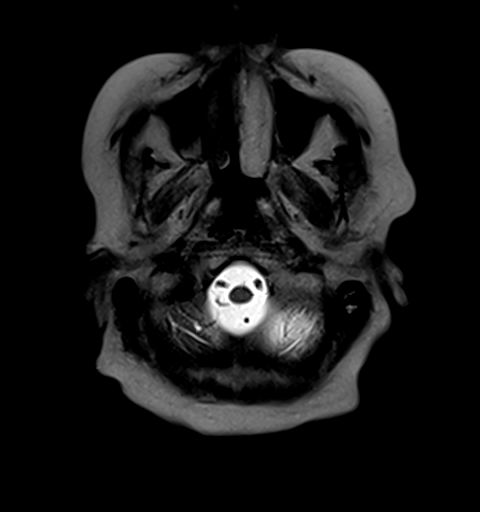
[im 22/22]
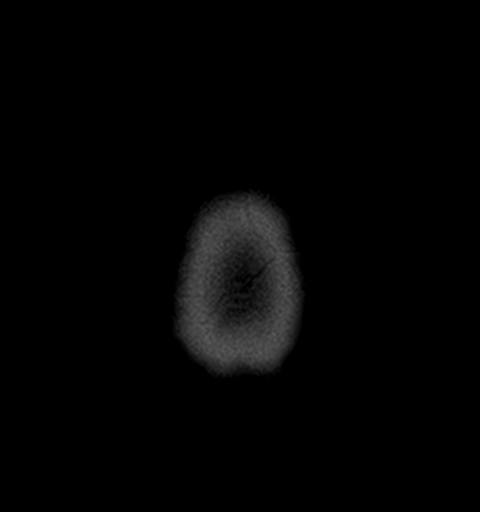

[Series 9: GRE · axial · 3.0mm · 0.45mm/px · z∈[-62,+88]mm · 5 of 51 slices shown]
[im 1/51]
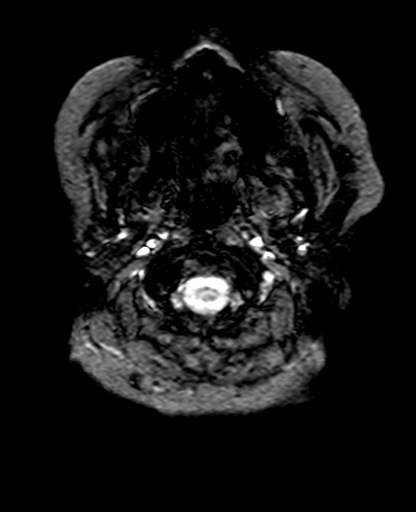
[im 13/51]
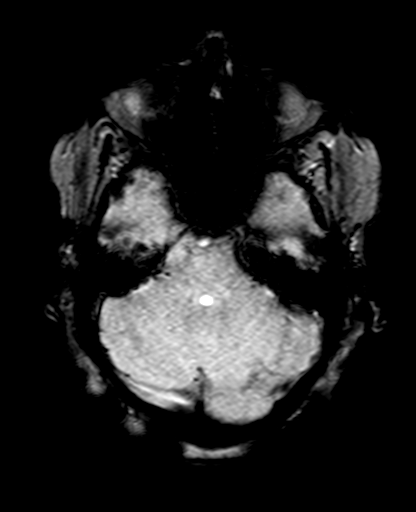
[im 26/51]
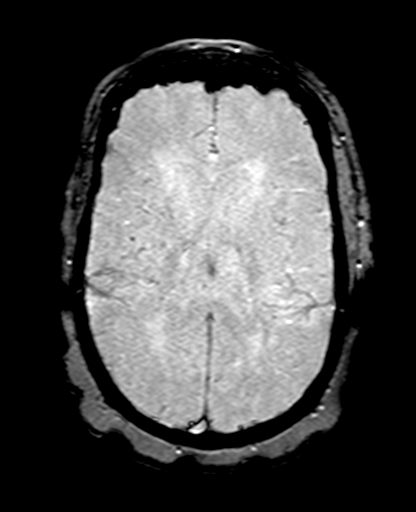
[im 38/51]
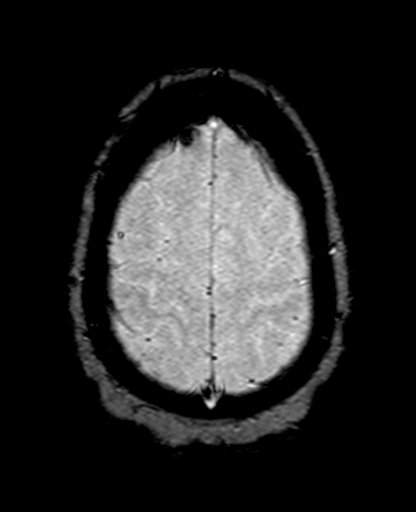
[im 51/51]
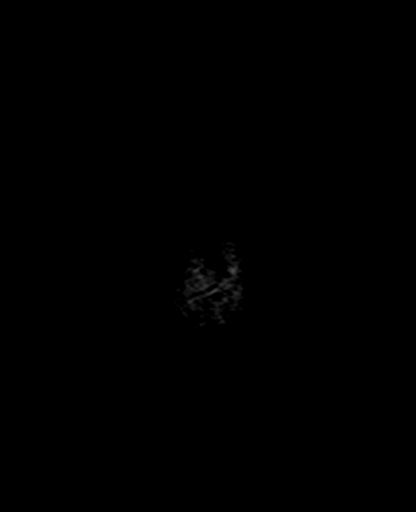

[Series 10: FLAIR · axial · 3.0mm · 0.86mm/px · z∈[-63,+87]mm · 5 of 51 slices shown]
[im 1/51]
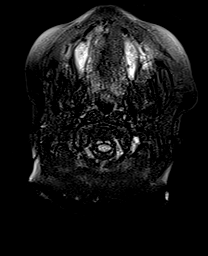
[im 13/51]
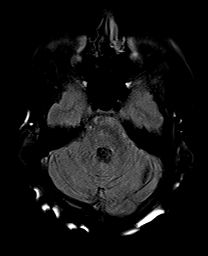
[im 26/51]
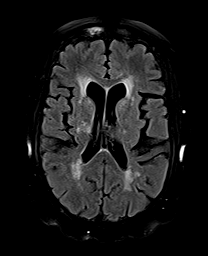
[im 38/51]
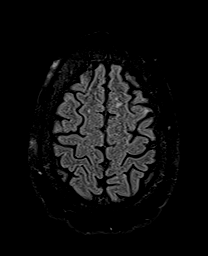
[im 51/51]
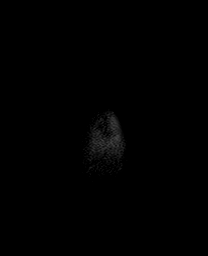

[Series 11: T1 · axial · 3.0mm · 0.45mm/px · z∈[-62,+88]mm · 5 of 51 slices shown]
[im 1/51]
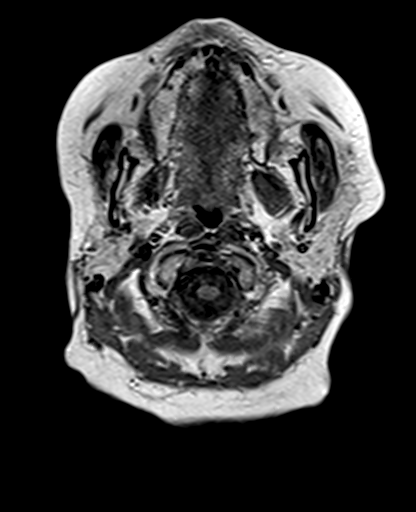
[im 13/51]
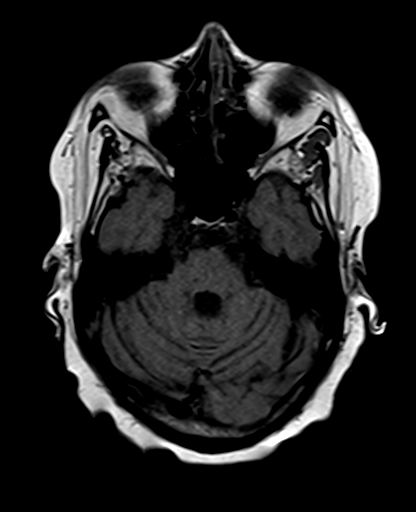
[im 26/51]
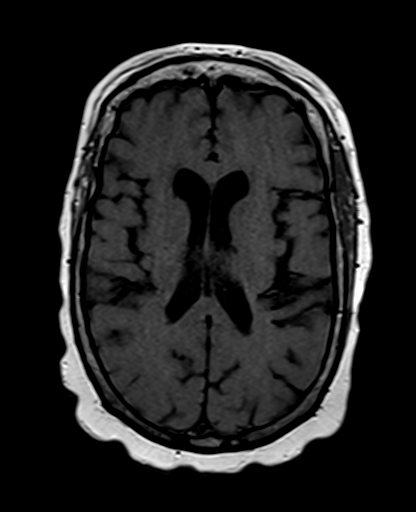
[im 38/51]
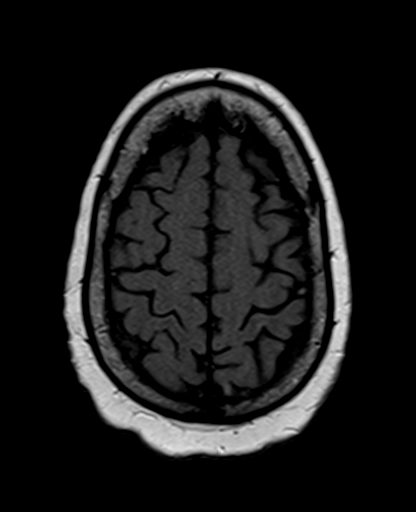
[im 51/51]
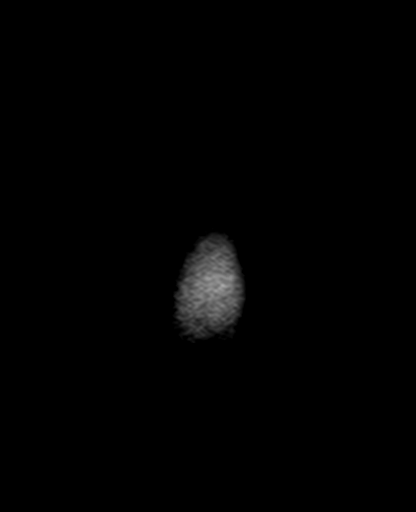

[Series 12: DWI · coronal · 5.0mm · 1.31mm/px · 6 of 56 slices shown (1 of 2)]
[im 1/56]
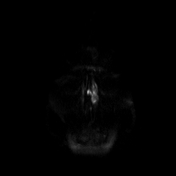
[im 12/56]
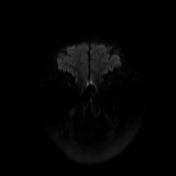
[im 23/56]
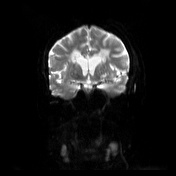
[im 34/56]
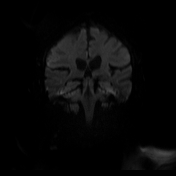
[im 45/56]
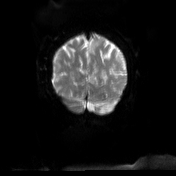
[im 56/56]
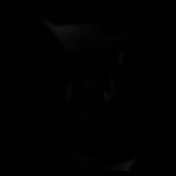

[Series 13: DWI · coronal · 5.0mm · 1.31mm/px · 3 of 28 slices shown (2 of 2)]
[im 1/28]
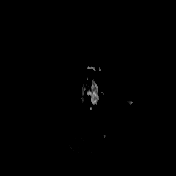
[im 14/28]
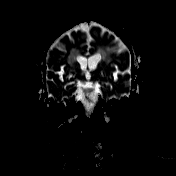
[im 28/28]
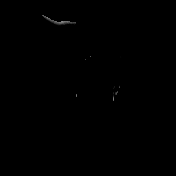

[Series 14: T2 · coronal · 5.0mm · 0.86mm/px · 3 of 28 slices shown (3 of 3)]
[im 1/28]
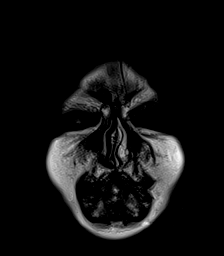
[im 14/28]
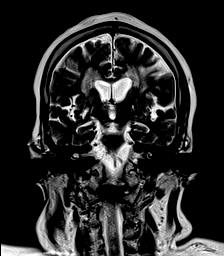
[im 28/28]
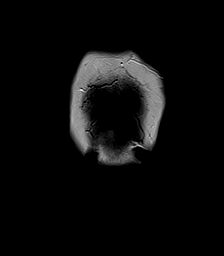

[44 of 48 positions shown; findings below may reference images not displayed]

FINDINGS: Brain: No acute infarction, hemorrhage, hydrocephalus, extra-axial
collection or mass lesion. Remote infarcts in the left frontal lobe,
cerebellum, bilateral basal ganglia and left thalamus. Wallerian
degeneration of the right midbrain. Additional moderate patchy
T2/FLAIR hyperintensities in the white matter, nonspecific
compatible with age-advanced chronic microvascular ischemic disease.
Small foci of susceptibility artifact within left thalamus and right
basal ganglia, and pons, compatible with chronic microhemorrhages.

Vascular: Major arterial flow voids are maintained at the skull
base.

Skull and upper cervical spine: Normal marrow signal.

Sinuses/Orbits: Clear sinuses.  No acute orbital findings

Other: No mastoid effusions.
IMPRESSION: 1. No evidence of acute intracranial abnormality.
2. Multiple remote infarcts and age-advanced chronic microvascular
disease.
3. Chronic microhemorrhages, potentially hypertensive in etiology.

## 2021-06-11 IMAGING — CT CT HEAD W/O CM
3 series · 15 of 47 positions shown, 18 images · non-contrast
Comparison: CT head [DATE].

CLINICAL DATA: Neuro deficit, acute, stroke suspected



[Series 3: head wo · axial · 0.47mm/px · z∈[-147,-22]mm · 9 of 30 slices shown, 12 images]
[im 3/30  brain]
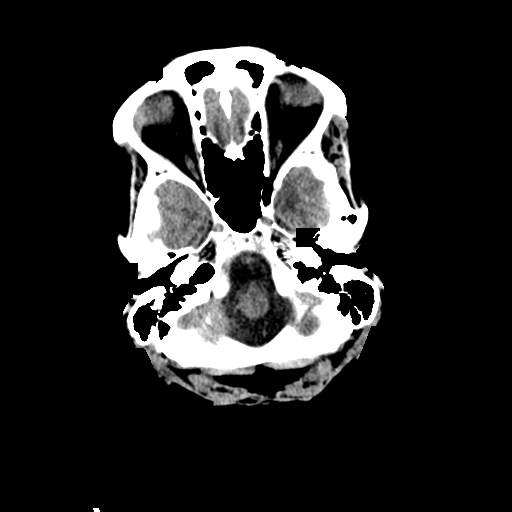
[im 3/30  bone]
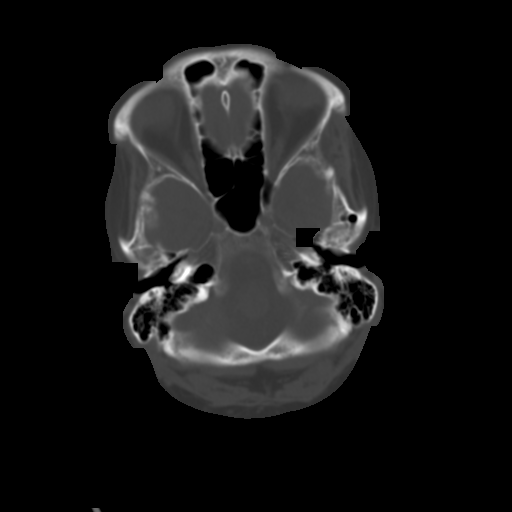
[im 6/30  brain]
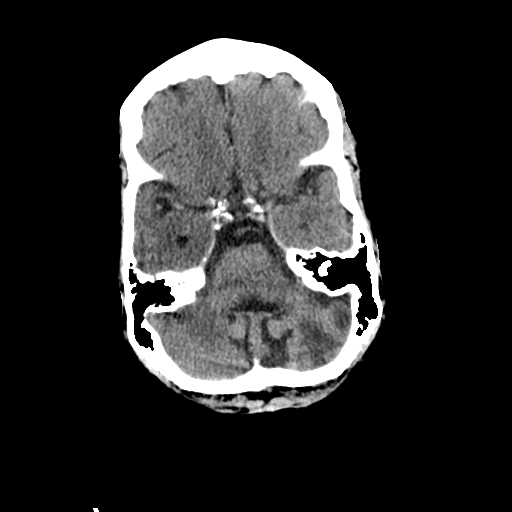
[im 9/30  brain]
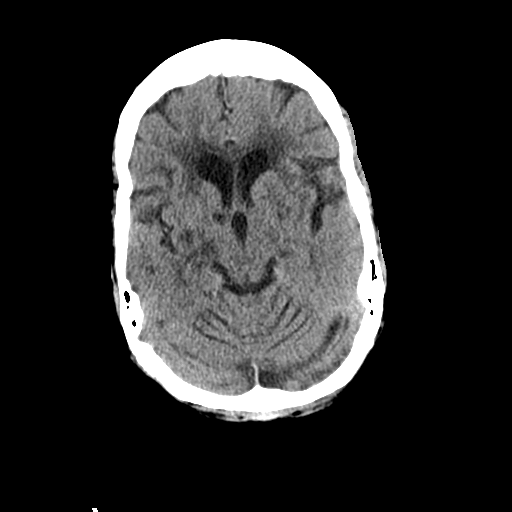
[im 12/30  brain]
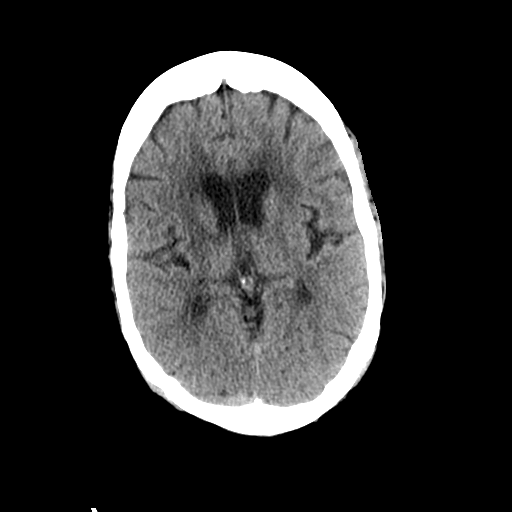
[im 16/30  brain]
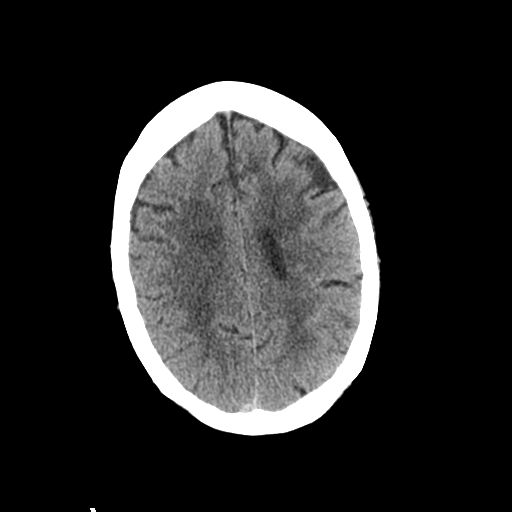
[im 16/30  bone]
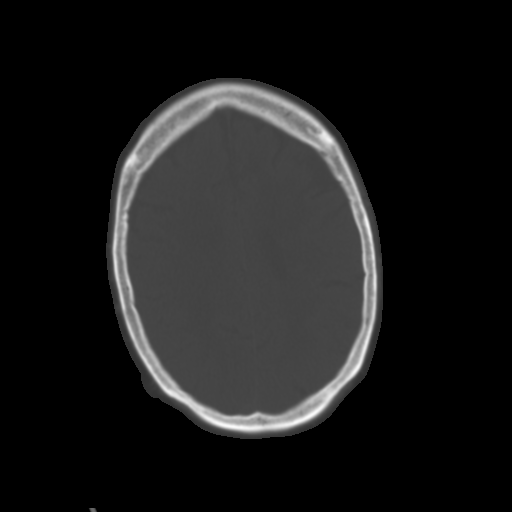
[im 19/30  brain]
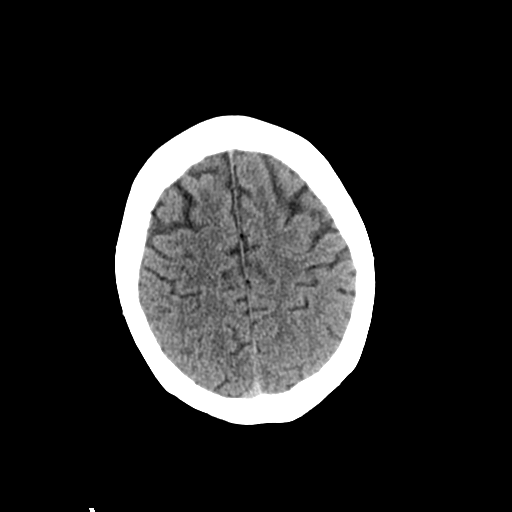
[im 22/30  brain]
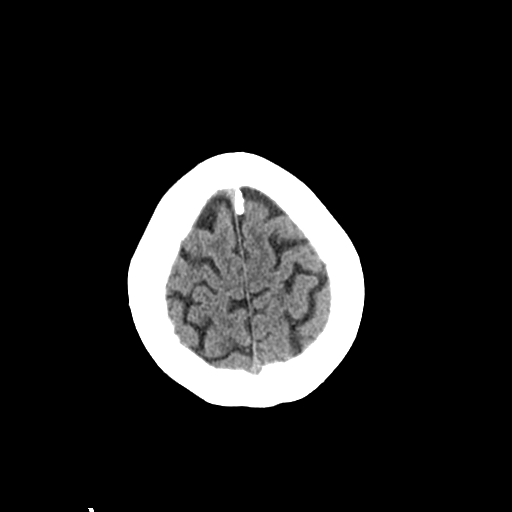
[im 25/30  brain]
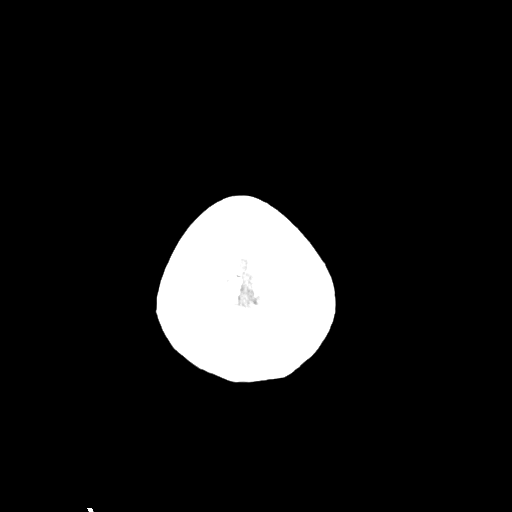
[im 28/30  brain]
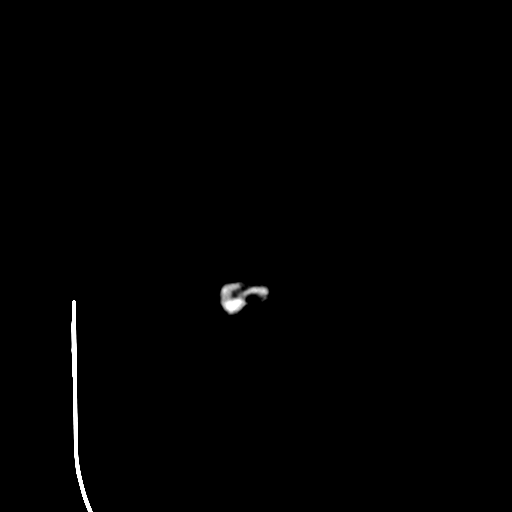
[im 28/30  bone]
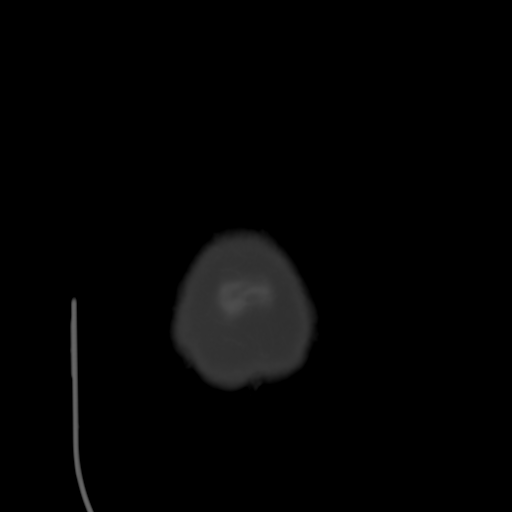

[Series 6: sagittal soft tissue · sagittal · 0.32mm/px · 3 of 54 slices shown]
[im 18/54  brain]
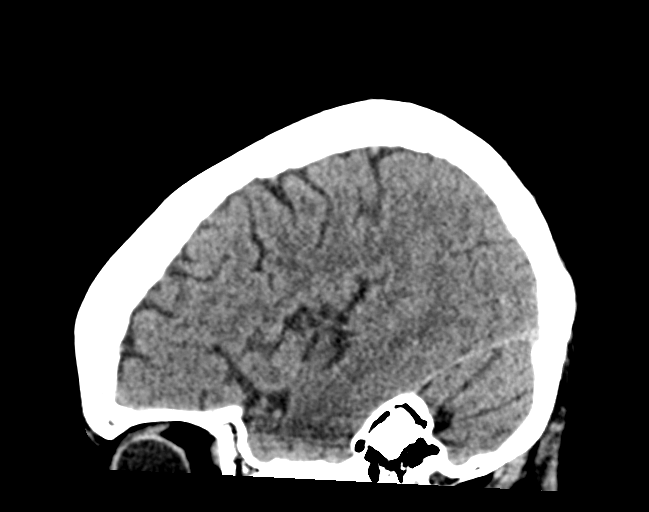
[im 27/54  brain]
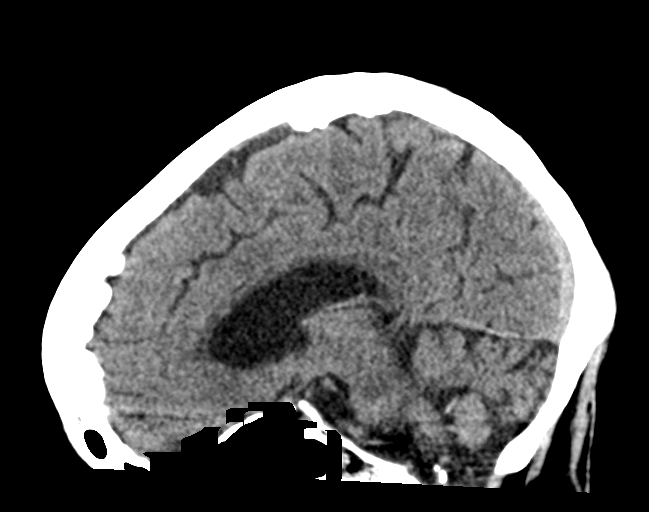
[im 36/54  brain]
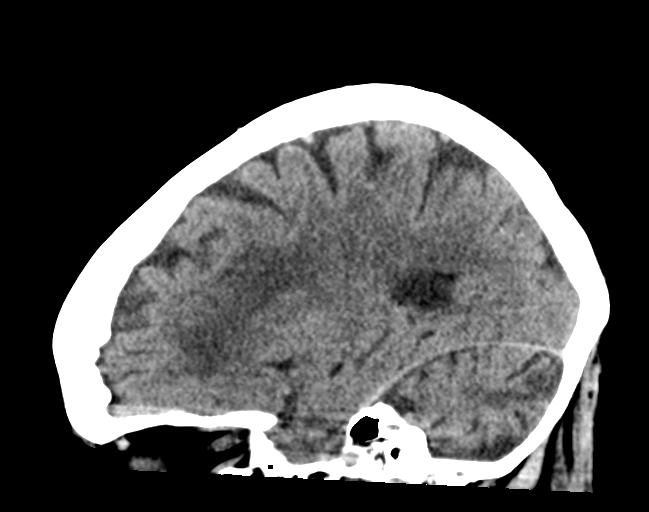

[Series 7: coronal soft tissue · coronal · 0.32mm/px · 3 of 67 slices shown]
[im 23/67  brain]
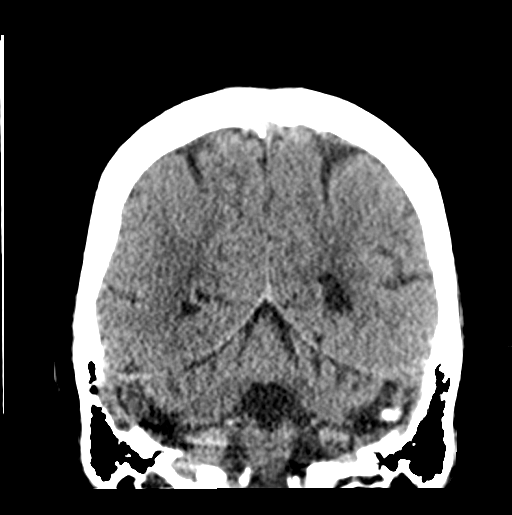
[im 30/67  brain]
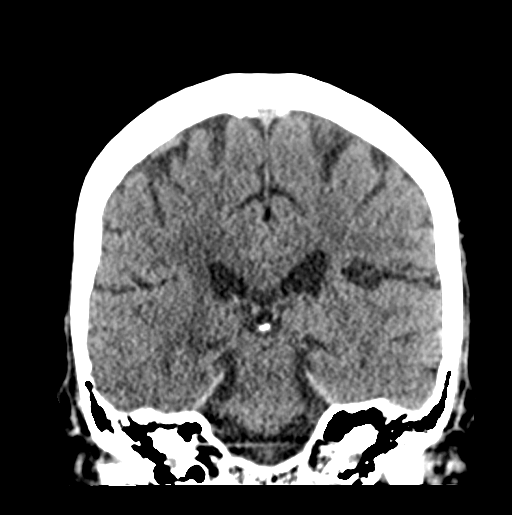
[im 37/67  brain]
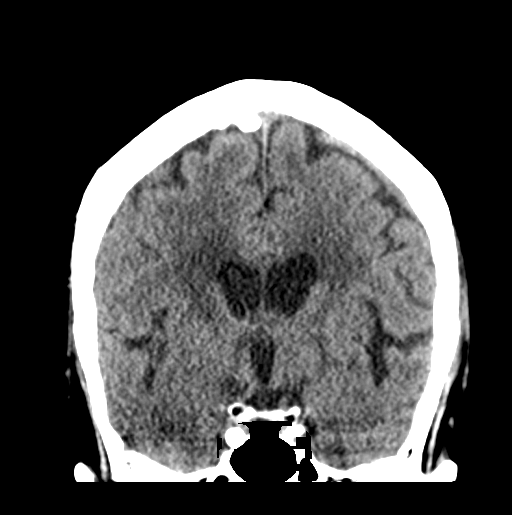

[15 of 47 positions shown; findings below may reference images not displayed]

FINDINGS: Brain: No evidence of acute large vascular territory infarction,
hemorrhage, hydrocephalus, extra-axial collection or mass
lesion/mass effect. Similar remote left cerebellar infarct. Similar
remote infarcts in the left frontal lobe, bilateral basal ganglia,
and left thalamus. Additional age-advanced white matter
hypodensities are nonspecific but compatible with chronic
microvascular ischemic disease.

Vascular: No hyperdense vessel identified. Calcific intracranial
atherosclerosis.

Skull: No acute fracture.

Sinuses/Orbits: Clear sinuses.  No acute orbital findings.

Other: No mastoid effusions.
IMPRESSION: 1. No evidence of acute intracranial abnormality.
2. Multiple remote infarcts and age-advanced chronic microvascular
ischemic disease.

## 2021-06-11 IMAGING — CR DG CHEST 2V
2 series · 2 of 2 positions shown · non-contrast
Comparison: Chest x-ray [DATE].

CLINICAL DATA: vomiting and chest pain

EXAM:
CHEST - 2 VIEW

[w chest lat]
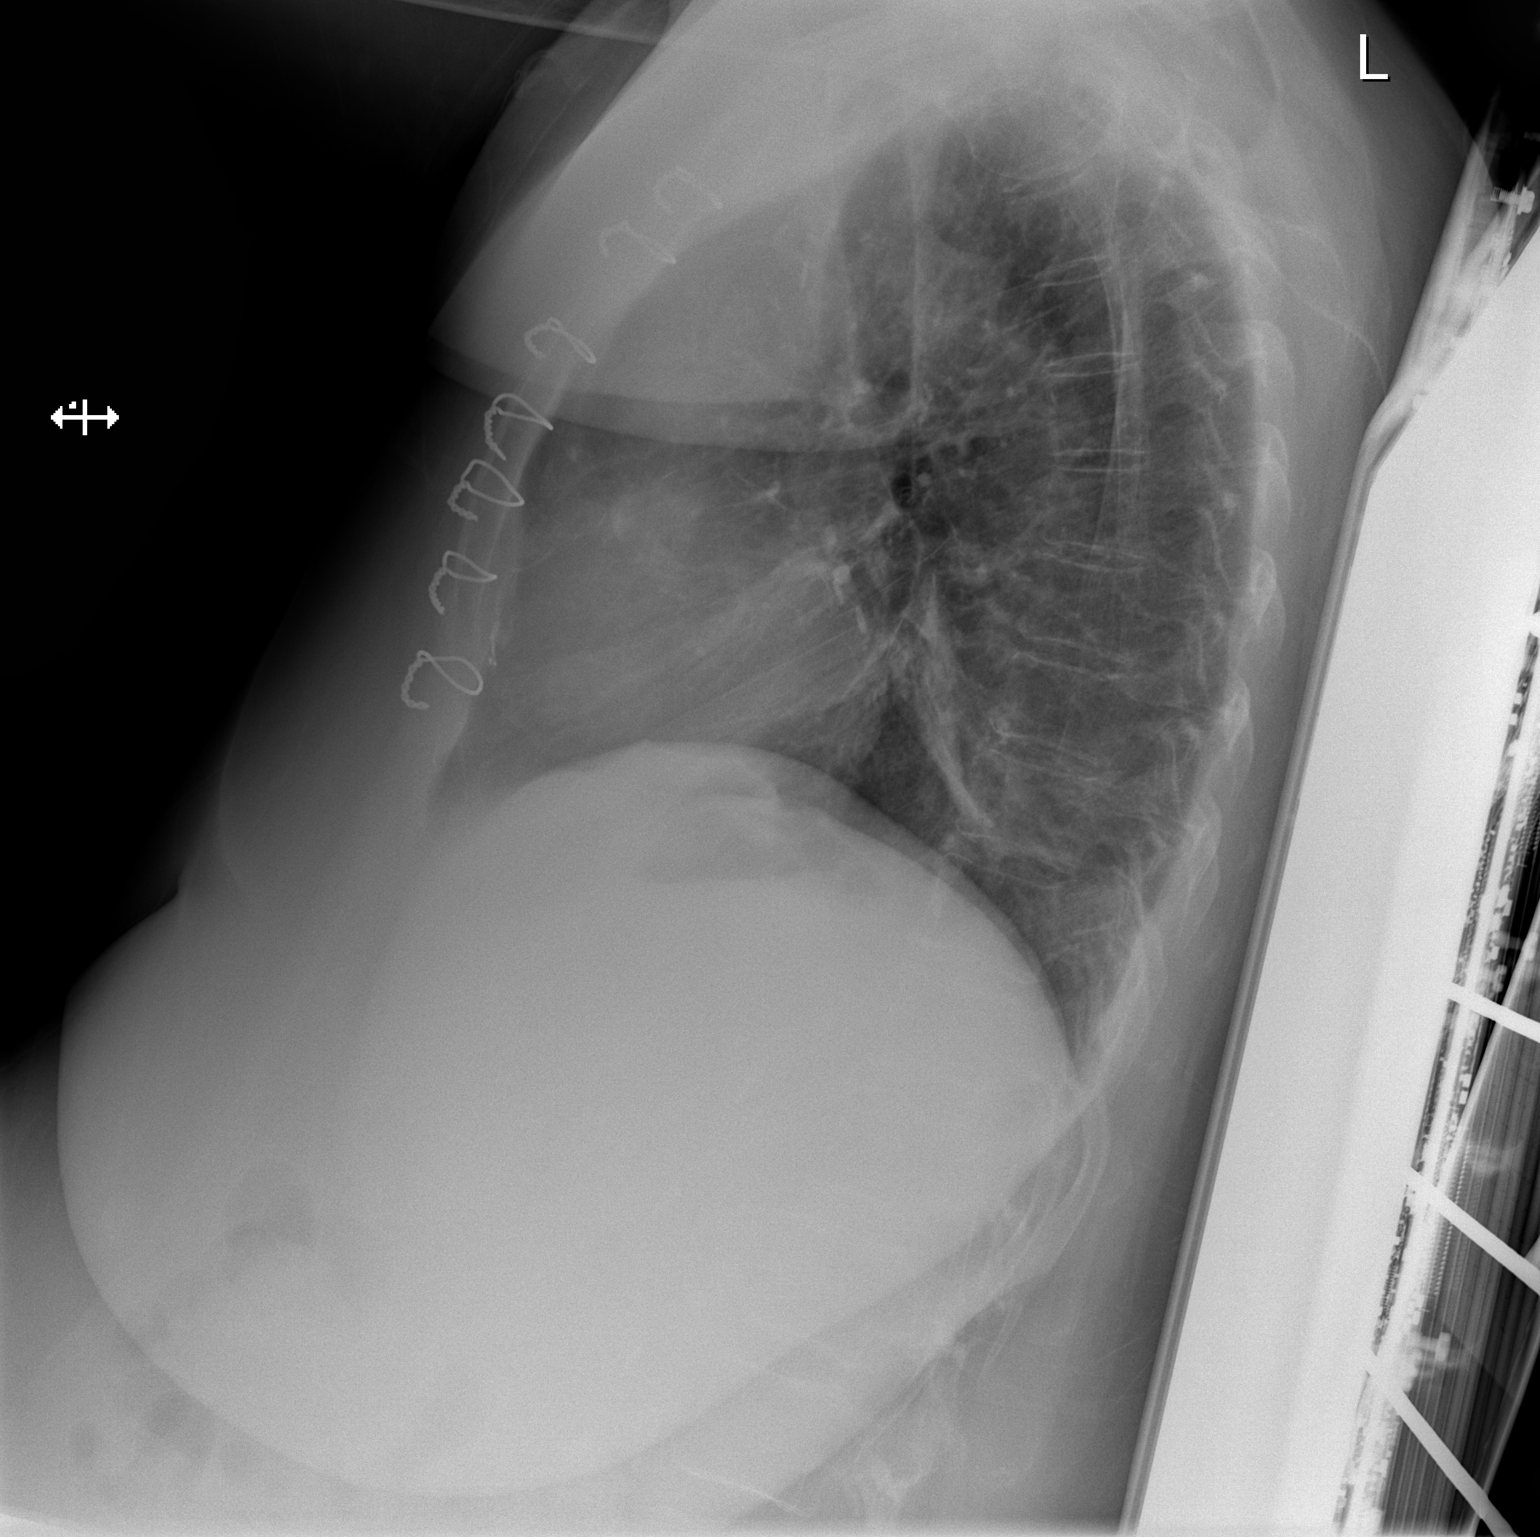

[x chest ap]
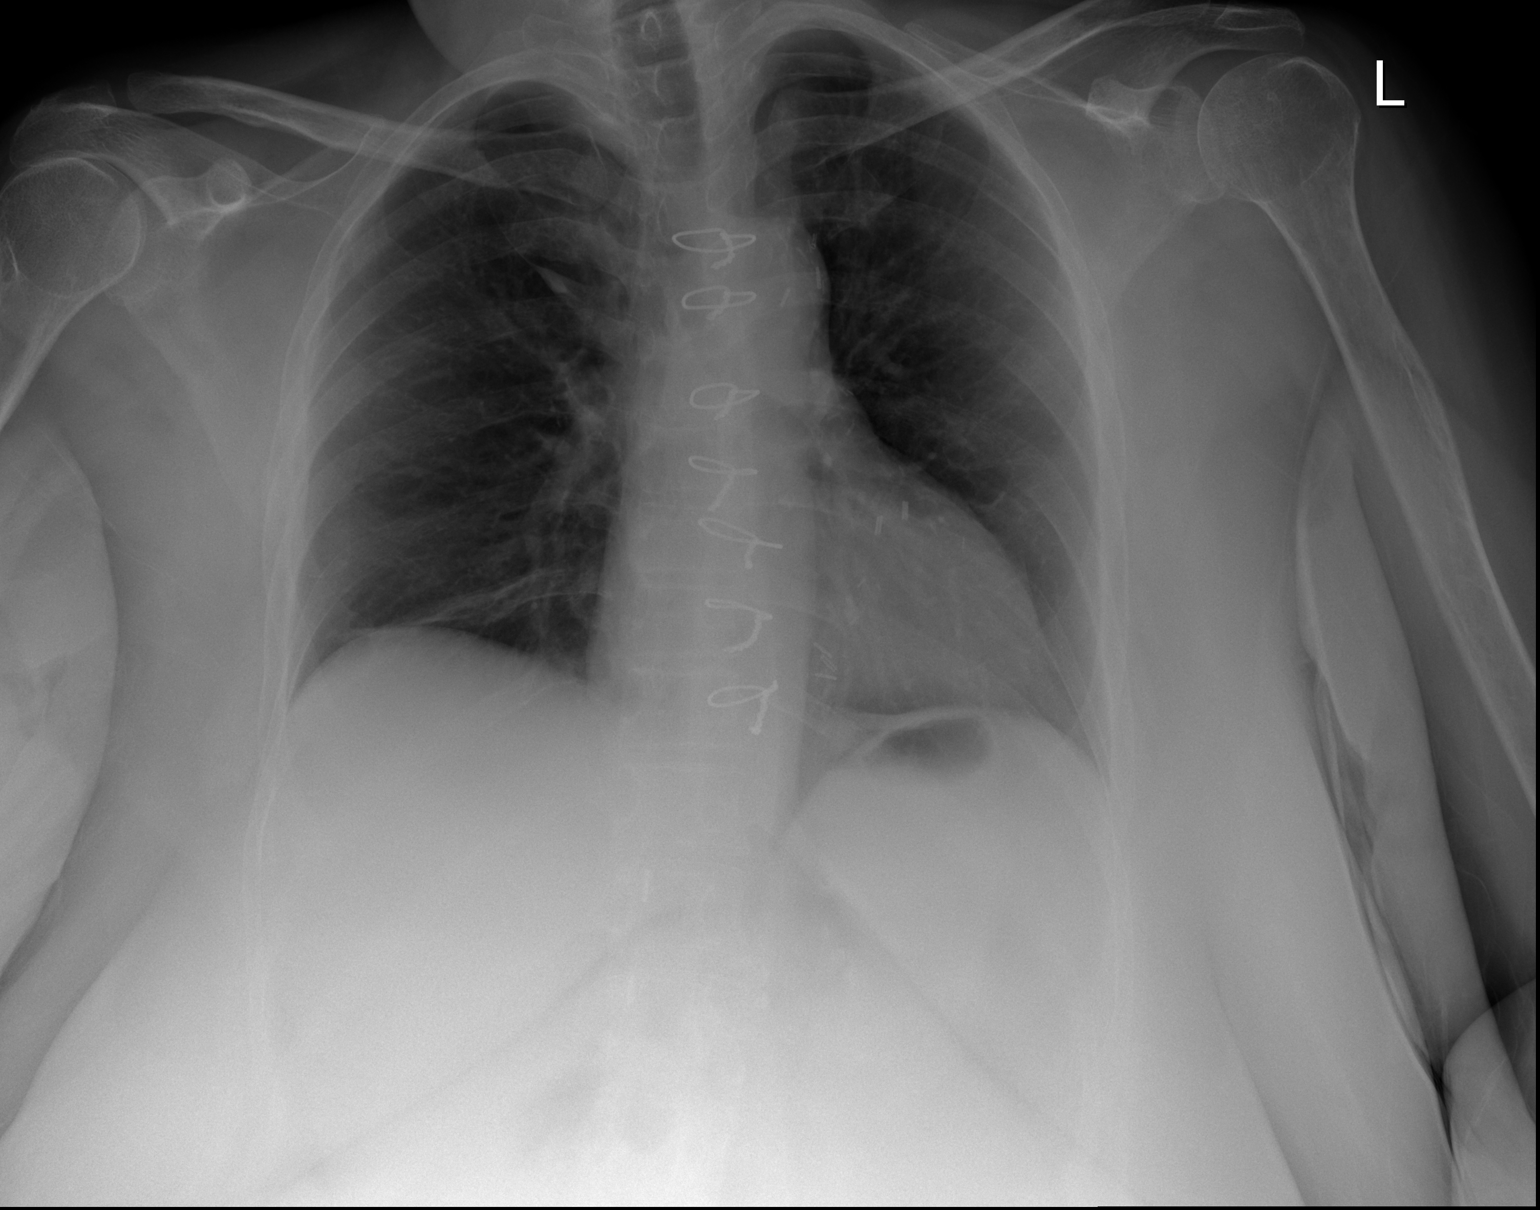

[2 of 2 positions shown; findings below may reference images not displayed]

FINDINGS: Similar cardiomediastinal silhouette. CABG and median sternotomy. No
visible pleural effusions or pneumothorax. No consolidation. Right
upper lobe azygous fissure, anatomic variant. No displaced fracture.
IMPRESSION: No evidence of acute cardiopulmonary disease.

## 2021-06-11 MED ORDER — FAMOTIDINE IN NACL 20-0.9 MG/50ML-% IV SOLN
20.0000 mg | Freq: Once | INTRAVENOUS | Status: AC
  Administered 2021-06-11: 20 mg via INTRAVENOUS
  Filled 2021-06-11: qty 50

## 2021-06-11 MED ORDER — LACTATED RINGERS IV BOLUS
1000.0000 mL | Freq: Once | INTRAVENOUS | Status: AC
  Administered 2021-06-11: 1000 mL via INTRAVENOUS

## 2021-06-11 MED ORDER — DIPHENHYDRAMINE HCL 50 MG/ML IJ SOLN
25.0000 mg | Freq: Once | INTRAMUSCULAR | Status: AC
  Administered 2021-06-11: 25 mg via INTRAVENOUS

## 2021-06-11 MED ORDER — LIDOCAINE VISCOUS HCL 2 % MT SOLN
15.0000 mL | Freq: Once | OROMUCOSAL | Status: AC
Start: 2021-06-11 — End: 2021-06-11
  Administered 2021-06-11: 15 mL via ORAL
  Filled 2021-06-11: qty 15

## 2021-06-11 MED ORDER — SODIUM CHLORIDE 0.9 % IV SOLN
1.0000 g | Freq: Once | INTRAVENOUS | Status: AC
  Administered 2021-06-11: 1 g via INTRAVENOUS
  Filled 2021-06-11: qty 10

## 2021-06-11 MED ORDER — METOPROLOL TARTRATE 5 MG/5ML IV SOLN
10.0000 mg | Freq: Once | INTRAVENOUS | Status: AC
  Administered 2021-06-11: 10 mg via INTRAVENOUS
  Filled 2021-06-11: qty 10

## 2021-06-11 MED ORDER — ONDANSETRON HCL 4 MG PO TABS: 4.0000 mg | ORAL_TABLET | Freq: Four times a day (QID) | ORAL | 0 refills | Status: DC

## 2021-06-11 MED ORDER — PROCHLORPERAZINE EDISYLATE 10 MG/2ML IJ SOLN
10.0000 mg | Freq: Once | INTRAMUSCULAR | Status: AC
  Administered 2021-06-11: 10 mg via INTRAVENOUS
  Filled 2021-06-11: qty 2

## 2021-06-11 MED ORDER — CEPHALEXIN 500 MG PO CAPS: 500.0000 mg | ORAL_CAPSULE | Freq: Three times a day (TID) | ORAL | 0 refills | Status: DC

## 2021-06-11 MED ORDER — SODIUM CHLORIDE 0.9 % IV BOLUS
1000.0000 mL | Freq: Once | INTRAVENOUS | Status: AC
  Administered 2021-06-11: 1000 mL via INTRAVENOUS

## 2021-06-11 MED ORDER — DIPHENHYDRAMINE HCL 50 MG/ML IJ SOLN
25.0000 mg | Freq: Once | INTRAMUSCULAR | Status: DC
  Administered 2021-06-11: 25 mg via INTRAVENOUS
  Filled 2021-06-11: qty 1

## 2021-06-11 MED ORDER — MAGNESIUM SULFATE 2 GM/50ML IV SOLN
2.0000 g | Freq: Once | INTRAVENOUS | Status: AC
  Administered 2021-06-11: 2 g via INTRAVENOUS
  Filled 2021-06-11: qty 50

## 2021-06-11 MED ORDER — ALUM & MAG HYDROXIDE-SIMETH 200-200-20 MG/5ML PO SUSP
30.0000 mL | Freq: Once | ORAL | Status: AC
  Administered 2021-06-11: 30 mL via ORAL
  Filled 2021-06-11: qty 30

## 2021-06-11 MED ORDER — METOCLOPRAMIDE HCL 5 MG/ML IJ SOLN
10.0000 mg | Freq: Once | INTRAMUSCULAR | Status: AC
  Administered 2021-06-11: 10 mg via INTRAVENOUS
  Filled 2021-06-11: qty 2

## 2021-06-11 NOTE — ED Triage Notes (Signed)
BIBA ?Per EMS: Had 3 episodes of vomiting this morning.  ?CBG 318 w/ EMS. Left side pain. Increased urine output last 3 days. Threw up morning meds.  ?Hx stroke, left sided paralysis  ? ?

## 2021-06-11 NOTE — ED Provider Notes (Signed)
?  Physical Exam  ?BP (!) 136/121   Pulse (!) 114   Temp 99 ?F (37.2 ?C) (Oral)   Resp 20   SpO2 97%  ? ?Physical Exam ? ?Procedures  ?Procedures ? ?ED Course / MDM  ?  ?Medical Decision Making ?Care assumed at 3 pm. Patient is here with ab pain, vomiting. Has hx of stroke and MRI showed no stroke. Sign out pending labs and reassessment. Patient tachycardic on arrival  ? ?5 pm ?Patient still tachycardic. She told me that she has hx of hyperthyroidism and had TSH checked a month ago but I don't see the result. She is on lopressor and threw up her meds this morning. Will check TSH and give IVF and give IV lopressor. Has mild epigastric tenderness so will get CT ab/pel. Patient has IV contrast allergy so will get non contrast CT  ? ?7:18 PM ?TSH nl. UA + UTI. CT unremarkable. Felt better after IVF and nausea medicine. HR down to low 100s. Stable for discharge.  ? ? ?Problems Addressed: ?Nausea and vomiting, unspecified vomiting type: acute illness or injury ?Tachycardia: acute illness or injury ?Urinary tract infection without hematuria, site unspecified: acute illness or injury ? ?Amount and/or Complexity of Data Reviewed ?Labs: ordered. Decision-making details documented in ED Course. ?Radiology: ordered and independent interpretation performed. Decision-making details documented in ED Course. ?ECG/medicine tests: ordered and independent interpretation performed. Decision-making details documented in ED Course. ? ?Risk ?OTC drugs. ?Prescription drug management. ? ? ? ? ? ? ? ?  ?Drenda Freeze, MD ?06/11/21 1921 ? ?

## 2021-06-11 NOTE — ED Provider Notes (Signed)
?[facility] DEPT ?Provider Note ? ? ?CSN: [id number] ?Arrival date & time: 06/11/21  1130 ? ?  ? ?History ? ?Chief Complaint  ?Patient presents with  ? Emesis  ? Tachycardia  ? Hyperglycemia  ? ? ?Lori [name] [name] is a 51 y.o. female. ? ? ?Emesis ?Associated symptoms: headaches   ?Hyperglycemia ?Associated symptoms: chest pain, nausea and vomiting   ?Patient presents for emesis.  Onset was this morning.  Medical history includes CAD, HTN, DM, multiple CVA, pheochromocytoma, anxiety, depression, HLD, depression.  In addition to emesis, she reports following symptoms: Headache for the past 3 days, located behind her eyes; nausea and anorexia starting yesterday; constipation for the past month (last bowel movement yesterday described as firm stool); chest pain for the past 2 days; subjective change to her voice today in addition to paresthesias in her right hand.  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  This is more pronounced in her left upper extremity.  She is not able to walk at baseline.  She does live at home and has nursing care 3 hours out of the day.  She has limited help outside of home nursing care.  She was not able to eat anything yesterday due to the nausea.  Vomiting started early this morning.  She does continue to feel nauseous.  She denies any pain. ?  ? ?Home Medications ?Prior to Admission medications   ?Medication Sig Start Date End Date Taking? Authorizing Provider  ?cephALEXin (KEFLEX) 500 MG capsule Take 1 capsule (500 mg total) by mouth 3 (three) times daily. 06/11/21  Yes Drenda Freeze, MD  ?ondansetron (ZOFRAN) 4 MG tablet Take 1 tablet (4 mg total) by mouth every 6 (six) hours. 06/11/21  Yes Drenda Freeze, MD  ?acetaminophen (TYLENOL) 325 MG tablet Take 2 tablets (650 mg total) by mouth every 6 (six) hours as needed for mild pain or headache. 02/26/21   Pokhrel, Corrie Mckusick, MD  ?amLODipine (NORVASC) 10 MG tablet Take 1 tablet (10  mg total) by mouth daily. For elevated BP- please call PCP for refills- in future 05/14/21   Lovorn, Jinny Blossom, MD  ?amLODipine (NORVASC) 5 MG tablet TAKE 1 TABLET BY MOUTH EVERY MORNING 05/23/21   Ngetich, Dinah C, NP  ?atorvastatin (LIPITOR) 80 MG tablet TAKE 1 TABLET BY MOUTH AT BEDTIME 05/23/21   Ngetich, Dinah C, NP  ?blood glucose meter kit and supplies KIT Dispense based on patient and insurance preference. Use up to four times daily as directed. 03/16/21   Love, Ivan Anchors, PA-C  ?butalbital-acetaminophen-caffeine (FIORICET) 50-325-40 MG tablet TAKE 1 TABLET BY MOUTH EVERY 6 HOURS AS NEEDED FOR MIGRANE 05/22/21   Ngetich, Dinah C, NP  ?clopidogrel (PLAVIX) 75 MG tablet TAKE 1 TABLET BY MOUTH ONCE DAILY 05/23/21   Ngetich, Nelda Bucks, NP  ?Continuous Blood Gluc Receiver (FREESTYLE LIBRE 2 READER) DEVI Use to check blood sugar 3 times daily. 04/19/21   Charlott Rakes, MD  ?Continuous Blood Gluc Sensor (FREESTYLE LIBRE 2 SENSOR) MISC Use to check blood sugar 3 times daily. 04/19/21   Charlott Rakes, MD  ?diclofenac Sodium (VOLTAREN) 1 % GEL Apply 4 g topically 4 (four) times daily. 03/15/21   Love, Ivan Anchors, PA-C  ?escitalopram (LEXAPRO) 20 MG tablet TAKE 1 TABLET BY MOUTH EVERY MORNING 05/23/21   Ngetich, Dinah C, NP  ?ezetimibe (ZETIA) 10 MG tablet Take 1 tablet (10 mg total) by mouth daily. 05/23/21   Garvin Fila, MD  ?gabapentin (NEURONTIN)  100 MG capsule TAKE 1 CAPSULE BY MOUTH THREE TIMES DAILY (BREAKFAST, LUNCH, BEDTIME) 05/23/21   Ngetich, Dinah C, NP  ?hydrOXYzine (ATARAX) 25 MG tablet TAKE 1 TABLET BY MOUTH AT BEDTIME 05/23/21   Ngetich, Dinah C, NP  ?insulin aspart (NOVOLOG FLEXPEN) 100 UNIT/ML FlexPen INJECT PER SLIDING SCALE THREE TIMES DAILY WITH MEALS 05/23/21   Ngetich, Dinah C, NP  ?insulin glargine (LANTUS SOLOSTAR) 100 UNIT/ML Solostar Pen Inject 32 Units into the skin at bedtime. 06/01/21   Ngetich, Nelda Bucks, NP  ?Insulin Pen Needle 32G X 4 MM MISC Use to inject insulin 4 times daily as directed. 03/15/21   Love,  Ivan Anchors, PA-C  ?lip balm (CARMEX) ointment Apply topically as needed for lip care. 03/16/21   Love, Ivan Anchors, PA-C  ?lisinopril (ZESTRIL) 40 MG tablet Take 1 tablet (40 mg total) by mouth daily. 06/01/21   Ngetich, Dinah C, NP  ?loratadine (CLARITIN) 10 MG tablet Take 1 tablet (10 mg total) by mouth daily. 03/15/21   Bary Leriche, PA-C  ?LORazepam (ATIVAN) 0.5 MG tablet TAKE 1 TABLET BY MOUTH AT BEDTIME 05/23/21   Ngetich, Dinah C, NP  ?melatonin 3 MG TABS tablet Take 3 tablets (9 mg total) by mouth at bedtime. 04/20/21   Ngetich, Dinah C, NP  ?methimazole (TAPAZOLE) 5 MG tablet TAKE 1 TABLET BY MOUTH EVERY MORNING 05/23/21   Ngetich, Dinah C, NP  ?metoCLOPramide (REGLAN) 10 MG tablet Take 1 tablet (10 mg total) by mouth 3 (three) times daily before meals. 03/15/21   Love, Ivan Anchors, PA-C  ?metoprolol tartrate (LOPRESSOR) 100 MG tablet TAKE 1 TABLET BY MOUTH TWICE DAILY (BREAKFAST, BEDTIME) 05/23/21   Ngetich, Dinah C, NP  ?nystatin (MYCOSTATIN/NYSTOP) powder USE 1 APPLICATION TWICE DAILY 05/23/21   Ngetich, Dinah C, NP  ?pantoprazole (PROTONIX) 40 MG tablet TAKE 1 TABLET BY MOUTH EVERY MORNING 05/23/21   Ngetich, Dinah C, NP  ?polyethylene glycol powder (GLYCOLAX/MIRALAX) 17 GM/SCOOP powder Take 17 g by mouth 2 (two) times daily. 03/15/21   Love, Ivan Anchors, PA-C  ?senna (SENOKOT) 8.6 MG tablet Take 1 tablet by mouth as needed for constipation.    [provider]  ?tamsulosin (FLOMAX) 0.4 MG CAPS capsule TAKE 1 CAPSULE BY MOUTH AT BEDTIME 05/23/21   Ngetich, Dinah C, NP  ?   ? ?Allergies    ?Sumatriptan, Contrast media [iodinated contrast media], and Penicillins   ? ?Review of Systems   ?Review of Systems  ?Constitutional:  Positive for appetite change.  ?HENT:  Positive for voice change.   ?Cardiovascular:  Positive for chest pain.  ?Gastrointestinal:  Positive for constipation, nausea and vomiting.  ?Neurological:  Positive for headaches.  ?All other systems reviewed and are negative. ? ?Physical Exam ?Updated Vital  Signs ?BP (!) 136/99   Pulse (!) 114   Temp 98 ?F (36.7 ?C)   Resp 20   SpO2 97%  ?Physical Exam ?Vitals and nursing note reviewed.  ?Constitutional:   ?   General: She is not in acute distress. ?   Appearance: She is well-developed. She is ill-appearing (Chronically). She is not toxic-appearing or diaphoretic.  ?HENT:  ?   Head: Normocephalic and atraumatic.  ?   Right Ear: External ear normal.  ?   Left Ear: External ear normal.  ?   Nose: Nose normal.  ?   Mouth/Throat:  ?   Mouth: Mucous membranes are moist.  ?   Pharynx: Oropharynx is clear.  ?Eyes:  ?   Extraocular Movements:  Extraocular movements intact.  ?   Conjunctiva/sclera: Conjunctivae normal.  ?Cardiovascular:  ?   Rate and Rhythm: Normal rate and regular rhythm.  ?   Heart sounds: No murmur heard. ?Pulmonary:  ?   Effort: Pulmonary effort is normal. No respiratory distress.  ?   Breath sounds: Normal breath sounds. No wheezing or rales.  ?Chest:  ?   Chest wall: No tenderness.  ?Abdominal:  ?   Palpations: Abdomen is soft.  ?   Tenderness: There is no abdominal tenderness.  ?Musculoskeletal:     ?   General: No swelling.  ?   Cervical back: Normal range of motion and neck supple. No rigidity.  ?   Right lower leg: No edema.  ?   Left lower leg: No edema.  ?Skin: ?   General: Skin is warm and dry.  ?   Capillary Refill: Capillary refill takes less than 2 seconds.  ?   Coloration: Skin is not jaundiced or pale.  ?Neurological:  ?   Mental Status: She is alert.  ?   Comments: Baseline weakness in left hemibody, 5/5 strength in RUE, 4/5 strength in RLE (described as worse than baseline).  Sensation is intact.  ?Psychiatric:     ?   Mood and Affect: Mood normal.     ?   Behavior: Behavior normal.     ?   Thought Content: Thought content normal.     ?   Judgment: Judgment normal.  ? ? ?ED Results / Procedures / Treatments   ?Labs ?(all labs ordered are listed, but only abnormal results are displayed) ?Labs Reviewed  ?COMPREHENSIVE METABOLIC PANEL -  Abnormal; Notable for the following components:  ?    Result Value  ? Glucose, Bld 254 (*)   ? Total Protein 8.5 (*)   ? All other components within normal limits  ?CBC - Abnormal; Notable for the following co

## 2021-06-11 NOTE — ED Notes (Signed)
Pt has gone to x-ray ?

## 2021-06-11 NOTE — ED Provider Triage Note (Signed)
Emergency Medicine Provider Triage Evaluation Note ? ?[employer] , a 51 y.o. female  was evaluated in triage.  Pt complains of vomiting since this morning.  She has been unable to take her medications due to vomiting.  She complains of abdominal pain and chest pain after vomiting.  She was at her baseline yesterday.  No fever, chills, diarrhea, urinary complaints. ? ?Review of Systems  ?Positive:  ?Negative: See above ? ?Physical Exam  ?BP (!) 159/134 (BP Location: Left Arm)   Pulse (!) 143   Temp 99 ?F (37.2 ?C) (Oral)   Resp 20   SpO2 97%  ?Gen:   Awake, no distress   ?Resp:  Normal effort  ?MSK:   Moves extremities without difficulty  ?Other:  No abdominal tenderness ? ?Medical Decision Making  ?Medically screening exam initiated at 11:56 AM.  Appropriate orders placed.  G[employer] was informed that the remainder of the evaluation will be completed by another provider, this initial triage assessment does not replace that evaluation, and the importance of remaining in the ED until their evaluation is complete. ? ? ?  ?Hendricks Limes, PA-C ?06/11/21 1157 ? ?

## 2021-06-11 NOTE — ED Notes (Signed)
Pt reports she normally needs [place] IV. X1 attempt with [place] IV and MRI came to get pt. Pt transported to MRI at this time. Will have another [place] IV RN attempt for [place] IV upon return from MRI.  ?

## 2021-06-11 NOTE — Discharge Instructions (Addendum)
Take zofran for nausea.  ? ?Your MRI showed no stroke and your abdominal CT is unremarkable.  ? ?You have UTI so please take keflex as prescribed  ? ?See your doctor  ? ?Return to ER if you have worse abdominal pain, vomiting, dehydration, palpitations  ?

## 2021-06-12 ENCOUNTER — Other Ambulatory Visit: Payer: Self-pay | Admitting: Physical Medicine and Rehabilitation

## 2021-06-13 ENCOUNTER — Other Ambulatory Visit: Payer: Self-pay | Admitting: Internal Medicine

## 2021-06-13 DIAGNOSIS — F32A Depression, unspecified: Secondary | ICD-10-CM

## 2021-06-15 ENCOUNTER — Telehealth: Payer: Self-pay | Admitting: *Deleted

## 2021-06-15 DIAGNOSIS — I639 Cerebral infarction, unspecified: Secondary | ICD-10-CM

## 2021-06-15 NOTE — Telephone Encounter (Signed)
[place] with [facility] is calling requesting an order to be written and faxed to [facility] for a: ? ?Bariatric Bedside Commode.  ? ?Pended Order and sent to [facility] for Dx and Approval.  ?

## 2021-06-15 NOTE — Telephone Encounter (Signed)
Bedside Commode ordered .

## 2021-06-15 NOTE — Telephone Encounter (Signed)
Lori [name] called for extension of [name] for 1wk6. Approval given. ?

## 2021-06-15 NOTE — Telephone Encounter (Signed)
Order faxed to [facility] ?

## 2021-06-19 ENCOUNTER — Telehealth: Payer: Self-pay

## 2021-06-19 NOTE — Telephone Encounter (Signed)
Montrell with [facility] called stating that [facility] did not receive the fax that was sent on Friday, 5/12. ? ?New fax number: [phone] ? ?Order faxed. ?

## 2021-06-19 NOTE — Telephone Encounter (Signed)
I discussed both of Lori [name]'s responses with [name] and she verbalized understanding  ?

## 2021-06-19 NOTE — Telephone Encounter (Signed)
Incoming call received from [place] with [facility] to inform Lori [name] of a pulse reading of 113, which is outside of their parameters of 110. ? ?The patient informed Lori [name] that this is normal for her, as her pulse always runs high. Lori [name] if the parameters should be adjusted for this patient and if so to what? ? ?Please advise  ?

## 2021-06-19 NOTE — Telephone Encounter (Signed)
Patient has upcoming appointment here 06/21/2021 will recheck HR usually related to her anxiety too.  ?

## 2021-06-19 NOTE — Telephone Encounter (Signed)
Leave as it is for now until we see her.  ?

## 2021-06-19 NOTE — Telephone Encounter (Signed)
Do you want to change the parameters for when they are prompted to call or leave as is for now?  ? ?Currently they will call if pulse is greater than 110  ?

## 2021-06-20 ENCOUNTER — Telehealth: Payer: Self-pay | Admitting: *Deleted

## 2021-06-20 NOTE — Telephone Encounter (Signed)
Someone placed form on my desk asking if I had Original copy of the [employer] Prescription/Certificate of Medical Necessity was faxed because the company never received.  ? ?Not sure who has/had the original form. Copy of form was in Media tab with no Fax stamp.  ? ?Printed off form and Faxed to [employer] [phone] for Adult Bariatric Diapers and Underpads. Fax: [phone] ?

## 2021-06-21 ENCOUNTER — Encounter: Payer: Self-pay | Admitting: Family

## 2021-06-21 ENCOUNTER — Ambulatory Visit (INDEPENDENT_AMBULATORY_CARE_PROVIDER_SITE_OTHER): Payer: Medicaid Other | Admitting: Family

## 2021-06-21 ENCOUNTER — Other Ambulatory Visit: Payer: Self-pay | Admitting: Family

## 2021-06-21 VITALS — BP 140/80 | HR 113 | Temp 97.4°F | Resp 18 | Ht 62.0 in

## 2021-06-21 DIAGNOSIS — G43019 Migraine without aura, intractable, without status migrainosus: Secondary | ICD-10-CM

## 2021-06-21 DIAGNOSIS — R112 Nausea with vomiting, unspecified: Secondary | ICD-10-CM

## 2021-06-21 DIAGNOSIS — E1169 Type 2 diabetes mellitus with other specified complication: Secondary | ICD-10-CM

## 2021-06-21 DIAGNOSIS — E785 Hyperlipidemia, unspecified: Secondary | ICD-10-CM

## 2021-06-21 DIAGNOSIS — I639 Cerebral infarction, unspecified: Secondary | ICD-10-CM

## 2021-06-21 DIAGNOSIS — B3731 Acute candidiasis of vulva and vagina: Secondary | ICD-10-CM

## 2021-06-21 DIAGNOSIS — R000 Tachycardia, unspecified: Secondary | ICD-10-CM

## 2021-06-21 DIAGNOSIS — F32A Depression, unspecified: Secondary | ICD-10-CM

## 2021-06-21 DIAGNOSIS — F419 Anxiety disorder, unspecified: Secondary | ICD-10-CM | POA: Diagnosis not present

## 2021-06-21 MED ORDER — METOCLOPRAMIDE HCL 10 MG PO TABS: 10.0000 mg | ORAL_TABLET | Freq: Three times a day (TID) | ORAL | 1 refills | Status: DC

## 2021-06-21 MED ORDER — HYDROXYZINE HCL 25 MG PO TABS: 25.0000 mg | ORAL_TABLET | Freq: Every day | ORAL | 2 refills | Status: DC

## 2021-06-21 MED ORDER — TOPIRAMATE 25 MG PO TABS: 25.0000 mg | ORAL_TABLET | Freq: Two times a day (BID) | ORAL | 1 refills | Status: DC

## 2021-06-21 MED ORDER — FLUCONAZOLE 100 MG PO TABS: ORAL_TABLET | ORAL | 0 refills | Status: DC

## 2021-06-21 NOTE — Progress Notes (Signed)
Provider: Marlowe Sax FNP-C  Jahmar Mckelvy, Nelda Bucks, NP  Patient Care Team: Donte Kary, Nelda Bucks, NP as PCP - General (Family Medicine)  Extended Emergency Contact Information Primary Emergency Contact: [place] Phone: [phone] Mobile Phone: [phone] Relation: Daughter Secondary Emergency Contact: [name],[name] Mobile Phone: [phone] Relation: Spouse Preferred language: English Interpreter needed? No  Code Status: Full Code  Goals of care: Advanced Directive information    06/21/2021    2:39 PM  Advanced Directives  Does Patient Have a Medical Advance Directive? No  Would patient like information on creating a medical advance directive? No - Patient declined     Chief Complaint  Patient presents with   Hospitalization [facility] follow up 06/11/2021 for nausea and vomiting.    Concern     Requesting muscle relaxer medications.     HPI:  Pt is a 51 y.o. female seen today for an acute visit for evaluation of  Hospital follow up 06/11/2021 for nausea and vomiting.  She had taken Tylenol present through throw up prior to ED visit.  She was treated with IV fluids and given IV  Lopressor.  Mild epigastric tenderness was noted so CT scan of the abdomen and pelvis was ordered noncontrast CT scan due to allergy to contrast.  Had tachycardia TSH was checked but was normal.  Urine analysis was positive for UTI and has CT scan was unremarkable. Patient patient condition improved after IV fluids and nausea medication and heart rate went down to 100s.  States vomiting has improved but still requires nausea medication before meals. She denies any fever chills, constipation, abdominal pain, or bloating.  Also requests medication to help with her migraine.  Has 2-3 migraines per week. Complains of vaginal itching and whitish discharge.  Has had this in the past states due to yeast infection.  Forms completed for [place] [facility] [facility] request for PA CMN /PA for bariatric  diapers 180 per month and underpad 120 per month.     Past Medical History:  Diagnosis Date   CAD (coronary artery disease)    Depression    Diabetes mellitus without complication ([place])    History of CT scan    History of mammogram    History of MRI    Hypertension    Pheochromocytoma    Pheochromocytoma    Stroke ([place])    Thyroid disease    Past Surgical History:  Procedure Laterality Date   ABDOMINAL HYSTERECTOMY     CESAREAN SECTION     3   CORONARY ARTERY BYPASS GRAFT     Right adrenal gland removal for pheochromocytoma Right     Allergies  Allergen Reactions   Sumatriptan Anaphylaxis   Contrast Media [Iodinated Contrast Media] Swelling   Penicillins Swelling    Mouth swells up and eyes swollen shut    Outpatient Encounter Medications as of 06/21/2021  Medication Sig   acetaminophen (TYLENOL) 325 MG tablet Take 2 tablets (650 mg total) by mouth every 6 (six) hours as needed for mild pain or headache.   amLODipine (NORVASC) 10 MG tablet Take 1 tablet (10 mg total) by mouth daily. For elevated BP- please call PCP for refills- in future   atorvastatin (LIPITOR) 80 MG tablet TAKE 1 TABLET BY MOUTH AT BEDTIME   blood glucose meter kit and supplies KIT Dispense based on patient and insurance preference. Use up to four times daily as directed.   butalbital-acetaminophen-caffeine (FIORICET) 50-325-40 MG tablet TAKE 1 TABLET BY MOUTH EVERY 6 HOURS  AS NEEDED FOR MIGRANE   clopidogrel (PLAVIX) 75 MG tablet TAKE 1 TABLET BY MOUTH ONCE DAILY   Continuous Blood Gluc Receiver (FREESTYLE LIBRE 2 READER) DEVI Use to check blood sugar 3 times daily.   Continuous Blood Gluc Sensor (FREESTYLE LIBRE 2 SENSOR) MISC Use to check blood sugar 3 times daily.   diclofenac Sodium (VOLTAREN) 1 % GEL Apply 4 g topically 4 (four) times daily.   escitalopram (LEXAPRO) 20 MG tablet TAKE 1 TABLET BY MOUTH EVERY MORNING   ezetimibe (ZETIA) 10 MG tablet Take 1 tablet (10 mg total) by mouth daily.    gabapentin (NEURONTIN) 100 MG capsule TAKE 1 CAPSULE BY MOUTH THREE TIMES DAILY (BREAKFAST, LUNCH, BEDTIME)   hydrOXYzine (ATARAX) 25 MG tablet TAKE 1 TABLET BY MOUTH AT BEDTIME   insulin aspart (NOVOLOG FLEXPEN) 100 UNIT/ML FlexPen INJECT PER SLIDING SCALE THREE TIMES DAILY WITH MEALS   insulin glargine (LANTUS SOLOSTAR) 100 UNIT/ML Solostar Pen Inject 32 Units into the skin at bedtime.   Insulin Pen Needle 32G X 4 MM MISC Use to inject insulin 4 times daily as directed.   lip balm (CARMEX) ointment Apply topically as needed for lip care.   lisinopril (ZESTRIL) 40 MG tablet Take 1 tablet (40 mg total) by mouth daily.   loratadine (CLARITIN) 10 MG tablet Take 1 tablet (10 mg total) by mouth daily.   LORazepam (ATIVAN) 0.5 MG tablet TAKE 1 TABLET BY MOUTH AT BEDTIME   melatonin 3 MG TABS tablet Take 3 tablets (9 mg total) by mouth at bedtime.   methimazole (TAPAZOLE) 5 MG tablet TAKE 1 TABLET BY MOUTH EVERY MORNING   metoCLOPramide (REGLAN) 10 MG tablet Take 1 tablet (10 mg total) by mouth 3 (three) times daily before meals.   metoprolol tartrate (LOPRESSOR) 100 MG tablet TAKE 1 TABLET BY MOUTH TWICE DAILY (BREAKFAST, BEDTIME)   nystatin (MYCOSTATIN/NYSTOP) powder USE 1 APPLICATION TWICE DAILY   ondansetron (ZOFRAN) 4 MG tablet Take 1 tablet (4 mg total) by mouth every 6 (six) hours.   pantoprazole (PROTONIX) 40 MG tablet TAKE 1 TABLET BY MOUTH EVERY MORNING   polyethylene glycol powder (GLYCOLAX/MIRALAX) 17 GM/SCOOP powder Take 17 g by mouth 2 (two) times daily.   senna (SENOKOT) 8.6 MG tablet Take 1 tablet by mouth as needed for constipation.   tamsulosin (FLOMAX) 0.4 MG CAPS capsule TAKE 1 CAPSULE BY MOUTH AT BEDTIME   [DISCONTINUED] amLODipine (NORVASC) 5 MG tablet TAKE 1 TABLET BY MOUTH EVERY MORNING   [DISCONTINUED] cephALEXin (KEFLEX) 500 MG capsule Take 1 capsule (500 mg total) by mouth 3 (three) times daily.   No facility-administered encounter medications on file as of 06/21/2021.     Review of Systems  Constitutional:  Negative for appetite change, chills, fatigue, fever and unexpected weight change.  HENT:  Negative for congestion, dental problem, ear discharge, ear pain, facial swelling, hearing loss, nosebleeds, postnasal drip, rhinorrhea, sinus pressure, sinus pain, sneezing, sore throat, tinnitus and trouble swallowing.   Eyes:  Negative for pain, discharge, redness, itching and visual disturbance.  Respiratory:  Negative for cough, chest tightness, shortness of breath and wheezing.   Cardiovascular:  Negative for chest pain, palpitations and leg swelling.  Gastrointestinal:  Negative for abdominal distention, abdominal pain, blood in stool, constipation and diarrhea.       Vomiting has improve but still request nausea medication prior to meals   Endocrine: Negative for cold intolerance, heat intolerance, polydipsia, polyphagia and polyuria.  Genitourinary:  Negative for dysuria and flank pain.  Incontinent   Musculoskeletal:  Positive for gait problem. Negative for arthralgias, back pain, joint swelling, myalgias, neck pain and neck stiffness.  Skin:  Positive for color change. Negative for pallor and rash.       Redness under the breast   Neurological:  Positive for headaches. Negative for dizziness, syncope, speech difficulty, weakness, light-headedness and numbness.       Migraine   Hematological:  Does not bruise/bleed easily.  Psychiatric/Behavioral:  Negative for agitation, behavioral problems, confusion, hallucinations, self-injury, sleep disturbance and suicidal ideas. The patient is not nervous/anxious.     There is no immunization history on file for this patient. Pertinent  Health Maintenance Due  Topic Date Due   FOOT EXAM  Never done   OPHTHALMOLOGY EXAM  Never done   COLONOSCOPY (Pts 45-12yr Insurance coverage will need to be confirmed)  Never done   INFLUENZA VACCINE  09/04/2021   HEMOGLOBIN A1C  10/25/2021   PAP SMEAR-Modifier   Discontinued      05/01/2021    2:59 AM 05/14/2021    2:33 PM 05/17/2021    1:54 PM 06/11/2021   11:40 AM 06/21/2021    2:39 PM  FCole Campin the past year?  1 1  0  Was there an injury with Fall?  0 0  0  Fall Risk Category Calculator  2 2  0  Fall Risk Category  Moderate Moderate  Low  Patient Fall Risk Level High fall risk  Moderate fall risk Low fall risk Low fall risk  Patient at Risk for Falls Due to   History of fall(s)  No Fall Risks  Fall risk Follow up   Falls evaluation completed;Education provided;Falls prevention discussed  Falls evaluation completed   Functional Status Survey:    Vitals:   06/21/21 1432  BP: 140/80  Pulse: (!) 113  Resp: 18  Temp: (!) 97.4 F (36.3 C)  SpO2: 98%  Height: _0  (1.575 m)   Body mass index is 39.11 kg/m. Physical Exam Vitals reviewed.  Constitutional:      General: She is not in acute distress.    Appearance: Normal appearance. She is normal weight. She is not ill-appearing or diaphoretic.  HENT:     Head: Normocephalic.     Right Ear: Tympanic membrane, ear canal and external ear normal. There is no impacted cerumen.     Left Ear: Tympanic membrane, ear canal and external ear normal. There is no impacted cerumen.     Nose: Nose normal. No congestion or rhinorrhea.     Mouth/Throat:     Mouth: Mucous membranes are moist.     Pharynx: Oropharynx is clear. No oropharyngeal exudate or posterior oropharyngeal erythema.  Eyes:     General: No scleral icterus.       Right eye: No discharge.        Left eye: No discharge.     Extraocular Movements: Extraocular movements intact.     Conjunctiva/sclera: Conjunctivae normal.     Pupils: Pupils are equal, round, and reactive to light.  Neck:     Vascular: No carotid bruit.  Cardiovascular:     Rate and Rhythm: Regular rhythm. Tachycardia present.     Pulses: Normal pulses.     Heart sounds: Normal heart sounds. No murmur heard.   No friction rub. No gallop.  Pulmonary:      Effort: Pulmonary effort is normal. No respiratory distress.     Breath sounds: Normal breath sounds. No  wheezing, rhonchi or rales.  Chest:     Chest wall: No tenderness.  Abdominal:     General: Bowel sounds are normal. There is no distension.     Palpations: Abdomen is soft. There is no mass.     Tenderness: There is no abdominal tenderness. There is no right CVA tenderness, left CVA tenderness, guarding or rebound.  Musculoskeletal:        General: No swelling or tenderness. Normal range of motion.     Cervical back: Normal range of motion. No rigidity or tenderness.     Right lower leg: No edema.     Left lower leg: No edema.     Comments: Wheelchair bound requires assistance with transfer   Lymphadenopathy:     Cervical: No cervical adenopathy.  Skin:    General: Skin is warm and dry.     Coloration: Skin is not pale.     Findings: Erythema present. No bruising, lesion or rash.     Comments: Breast fold beefy redness noted   Neurological:     Mental Status: She is alert and oriented to person, place, and time.     Cranial Nerves: No cranial nerve deficit.     Sensory: No sensory deficit.     Motor: Weakness present.     Coordination: Coordination normal.     Gait: Gait abnormal.     Comments: Left side hemiparesis   Psychiatric:        Mood and Affect: Mood normal.        Speech: Speech normal.        Behavior: Behavior normal.        Thought Content: Thought content normal.        Judgment: Judgment normal.    Labs reviewed: Recent Labs    02/21/21 0407 02/22/21 0306 02/25/21 0542 02/26/21 3086 02/27/21 0612 04/24/21 1037 04/30/21 1808 06/11/21 1500  NA 134*   < > 131* 131*   < > 142 136 138  K 3.5   < > 4.6 4.4   < > 4.1 4.1 4.1  CL 99   < > 100 100   < > 104 103 102  CO2 27   < > 25 24   < > 18* 23 25  GLUCOSE 215*   < > 248* 247*   < > 241* 264* 254*  BUN 7   < > 14 18   < > _0 CREATININE 0.59   < > 0.60 0.73   < > 0.64 0.68 0.70  CALCIUM 8.6*    < > 8.6* 8.8*   < > 8.8 8.7* 9.6  MG 1.7   < > 1.9 1.8  --   --   --  1.6*  PHOS 4.3  --   --   --   --   --   --   --    < > = values in this interval not displayed.   Recent Labs    04/08/21 1540 04/24/21 1037 04/30/21 1808 06/11/21 1500  AST _1 ALT _2 ALKPHOS 96  --  92 124  BILITOT 0.2* 0.2 0.5 0.8  PROT 7.3 7.0 6.8 8.5*  ALBUMIN 3.5  --  3.2* 4.1   Recent Labs    03/12/21 0603 03/19/21 1740 04/24/21 1037 04/30/21 1808 06/11/21 1500  WBC 6.8   < > 5.3 6.3 11.7*  NEUTROABS 3.7  --  3,318 3.5  --  HGB 11.2*   < > 13.5 13.5 15.7*  HCT 34.5*   < > 42.0 40.6 45.6  MCV 82.7   < > 83.5 79.9* 76.6*  PLT 355   < > 368 390 405*   < > = values in this interval not displayed.   Lab Results  Component Value Date   TSH 1.072 06/11/2021   Lab Results  Component Value Date   HGBA1C 8.4 (H) 04/24/2021   Lab Results  Component Value Date   CHOL 183 04/24/2021   HDL 49 (L) 04/24/2021   LDLCALC 108 (H) 04/24/2021   TRIG 143 04/24/2021   CHOLHDL 3.7 04/24/2021    Significant Diagnostic Results in last 30 days:  CT ABDOMEN PELVIS WO CONTRAST  Result Date: 06/11/2021 CLINICAL DATA:  Nausea vomiting EXAM: CT ABDOMEN AND PELVIS WITHOUT CONTRAST TECHNIQUE: Multidetector CT imaging of the abdomen and pelvis was performed following the standard protocol without IV contrast. RADIATION DOSE REDUCTION: This exam was performed according to the departmental dose-optimization program which includes automated exposure control, adjustment of the mA and/or kV according to patient size and/or use of iterative reconstruction technique. COMPARISON:  Radiograph 02/14/2021, CT 01/19/2021 FINDINGS: Lower chest: Lung bases demonstrate linear scarring or atelectasis. No acute consolidation or effusion. Coronary vascular calcifications. Small moderate hiatal hernia. Hepatobiliary: No focal liver abnormality is seen. No gallstones, gallbladder wall thickening, or biliary dilatation.  Pancreas: Unremarkable. No pancreatic ductal dilatation or surrounding inflammatory changes. Spleen: Normal in size without focal abnormality. Adrenals/Urinary Tract: Right adrenal gland is nonvisualized corresponding to history of prior removal. Slightly thickened left adrenal gland without mass. No hydronephrosis. The bladder is unremarkable Stomach/Bowel: The stomach is nonenlarged. No dilated small bowel. No acute bowel wall thickening. Vascular/Lymphatic: Advanced aortic atherosclerosis. No aneurysm. No suspicious lymph nodes Reproductive: Status post hysterectomy. No adnexal masses. Other: Negative for pelvic effusion or free air. Heterogeneous soft tissue thickening along the low anterior abdominal wall measuring 3.4 by 2.2 cm, without significant change, appears to be at the level of patient's Cesarian section scar. Musculoskeletal: No acute or significant osseous findings. IMPRESSION: 1. No CT evidence for acute intra-abdominal or pelvic abnormality. 2. Nonvisualized right adrenal gland corresponding to history of surgical removal 3. 3.4 cm heterogenous soft tissue density within the deep subcutaneous soft tissues of the lower anterior abdominal wall, potentially related to prominent scarring from prior surgery (appears to be near low ventral scar), this may be correlated with physical exam Electronically Signed   By: Donavan Foil M.D.   On: 06/11/2021 17:13   DG Chest 2 View  Result Date: 06/11/2021 CLINICAL DATA:  vomiting and chest pain EXAM: CHEST - 2 VIEW COMPARISON:  Chest x-ray February 13, 23. FINDINGS: Similar cardiomediastinal silhouette. CABG and median sternotomy. No visible pleural effusions or pneumothorax. No consolidation. Right upper lobe azygous fissure, anatomic variant. No displaced fracture. IMPRESSION: No evidence of acute cardiopulmonary disease. Electronically Signed   By: Margaretha Sheffield M.D.   On: 06/11/2021 12:15   CT Head Wo Contrast  Result Date: 06/11/2021 CLINICAL  DATA:  Neuro deficit, acute, stroke suspected EXAM: CT HEAD WITHOUT CONTRAST TECHNIQUE: Contiguous axial images were obtained from the base of the skull through the vertex without intravenous contrast. RADIATION DOSE REDUCTION: This exam was performed according to the departmental dose-optimization program which includes automated exposure control, adjustment of the mA and/or kV according to patient size and/or use of iterative reconstruction technique. COMPARISON:  CT head 02/19/2021. FINDINGS: Brain: No evidence of acute large vascular  territory infarction, hemorrhage, hydrocephalus, extra-axial collection or mass lesion/mass effect. Similar remote left cerebellar infarct. Similar remote infarcts in the left frontal lobe, bilateral basal ganglia, and left thalamus. Additional age-advanced white matter hypodensities are nonspecific but compatible with chronic microvascular ischemic disease. Vascular: No hyperdense vessel identified. Calcific intracranial atherosclerosis. Skull: No acute fracture. Sinuses/Orbits: Clear sinuses.  No acute orbital findings. Other: No mastoid effusions. IMPRESSION: 1. No evidence of acute intracranial abnormality. 2. Multiple remote infarcts and age-advanced chronic microvascular ischemic disease. Electronically Signed   By: Margaretha Sheffield M.D.   On: 06/11/2021 13:47   MR BRAIN WO CONTRAST  Result Date: 06/11/2021 CLINICAL DATA:  Neuro deficit, acute, stroke suspected EXAM: MRI HEAD WITHOUT CONTRAST TECHNIQUE: Multiplanar, multiecho pulse sequences of the brain and surrounding structures were obtained without intravenous contrast. COMPARISON:  CT head from the same day. FINDINGS: Brain: No acute infarction, hemorrhage, hydrocephalus, extra-axial collection or mass lesion. Remote infarcts in the left frontal lobe, cerebellum, bilateral basal ganglia and left thalamus. Wallerian degeneration of the right midbrain. Additional moderate patchy T2/FLAIR hyperintensities in the white  matter, nonspecific compatible with age-advanced chronic microvascular ischemic disease. Small foci of susceptibility artifact within left thalamus and right basal ganglia, and pons, compatible with chronic microhemorrhages. Vascular: Major arterial flow voids are maintained at the skull base. Skull and upper cervical spine: Normal marrow signal. Sinuses/Orbits: Clear sinuses.  No acute orbital findings Other: No mastoid effusions. IMPRESSION: 1. No evidence of acute intracranial abnormality. 2. Multiple remote infarcts and age-advanced chronic microvascular disease. 3. Chronic microhemorrhages, potentially hypertensive in etiology. Electronically Signed   By: Margaretha Sheffield M.D.   On: 06/11/2021 14:48    Assessment/Plan 1. Cerebrovascular accident (CVA), unspecified mechanism ([place]) Left hemiparesis -Continue on atorvastatin, Zetia and Plavix. - CBC with Differential/Platelet  2. Anxiety and depression Stable Continue on Lexapro and lorazepam  3. Type 2 diabetes mellitus with hyperlipidemia ([facility]) Lab Results  Component Value Date   HGBA1C 8.4 (H) 04/24/2021  -Continue on current insulin -Dietary modification advised - POC Glucose (CBG)  4. Hypomagnesemia Recent magnesium level was 1.6 in the ED we will recheck mag level today - Magnesium  5. Vulvovaginal candidiasis Reports vaginal itching with whitish discharge.  Start on Diflucan given as below - fluconazole (DIFLUCAN) 100 MG tablet; Take one by mouth x 1 dose then repeat dose in one week  Dispense: 2 tablet; Refill: 0  6. Nausea and vomiting, unspecified vomiting type Negative abdominal exam. Vomiting has resolved but continue to require Reglan for nausea prior to meals. - metoCLOPramide (REGLAN) 10 MG tablet; Take 1 tablet (10 mg total) by mouth 3 (three) times daily before meals.  Dispense: 90 tablet; Refill: 1  7. Intractable migraine without aura and without status migrainosus Has 2-3 migraines per week -Discussed to  start on Topamax as below - topiramate (TOPAMAX) 25 MG tablet; Take 1 tablet (25 mg total) by mouth 2 (two) times daily.  Dispense: 60 tablet; Refill: 1  8. Tachycardia Heart rate in the 110's despite use of Metroprolol 100 mg tablet -We will refer to cardiology for further evaluation. - Ambulatory referral to Cardiology  Family/ staff Communication: Reviewed plan of care with patient and husband verbalized understanding  Labs/tests ordered:  - CBC with Differential/Platelet - Magnesium  Next Appointment: As needed if symptoms worsen or fail to improve  [name], NP

## 2021-06-22 LAB — CBC WITH DIFFERENTIAL/PLATELET
Absolute Monocytes: 382 cells/uL (ref 200–950)
Basophils Absolute: 50 cells/uL (ref 0–200)
Basophils Relative: 0.7 %
Eosinophils Absolute: 180 cells/uL (ref 15–500)
Eosinophils Relative: 2.5 %
HCT: 44.8 % (ref 35.0–45.0)
Hemoglobin: 14.4 g/dL (ref 11.7–15.5)
Lymphs Abs: 2448 cells/uL (ref 850–3900)
MCH: 25.9 pg — ABNORMAL LOW (ref 27.0–33.0)
MCHC: 32.1 g/dL (ref 32.0–36.0)
MCV: 80.6 fL (ref 80.0–100.0)
MPV: 10.4 fL (ref 7.5–12.5)
Monocytes Relative: 5.3 %
Neutro Abs: 4140 cells/uL (ref 1500–7800)
Neutrophils Relative %: 57.5 %
Platelets: 394 10*3/uL (ref 140–400)
RBC: 5.56 10*6/uL — ABNORMAL HIGH (ref 3.80–5.10)
RDW: 14.5 % (ref 11.0–15.0)
Total Lymphocyte: 34 %
WBC: 7.2 10*3/uL (ref 3.8–10.8)

## 2021-06-22 LAB — MAGNESIUM: Magnesium: 1.6 mg/dL (ref 1.5–2.5)

## 2021-06-22 NOTE — Telephone Encounter (Signed)
Patient is requesting a 90 day supply, you approved yesterday for 30 day supply,  Please advise

## 2021-06-24 NOTE — Progress Notes (Signed)
Cardiology Office Note:    Date:  06/25/2021   ID:  Lori [name], DOB 20-Jun-1970, MRN [id number]  PCP:  Lori Hughs, NP   [facility] Providers Cardiologist:  Lori Sciara, MD Referring MD: Lori Hughs, NP   Chief Complaint/Reason for Referral: Establish cardiovascular care  ASSESSMENT:    1. Hx of CABG   2. Type 2 diabetes mellitus with complication, with long-term current use of insulin ([place])   3. Hypertension associated with diabetes ([place])   4. Hyperlipidemia associated with type 2 diabetes mellitus ([place])   5. Cerebrovascular accident (CVA), unspecified mechanism ([place])   6. Aortic atherosclerosis ([facility])     PLAN:    In order of problems listed above: 1.  History of CABG:  Continue Plavix in lieu of aspirin given history of stroke, metoprolol and atorvastatin.  Follow-up in 6 months.  We will obtain an echocardiogram to evaluate LV function. 2.  Type 2 diabetes: Continue Plavix in lieu of aspirin, lisinopril, atorvastatin; start Jardiance 10 mg daily  3.  Hypertension: Continue lisinopril and Norvasc as well as Lopressor with goal blood pressure less than 130/80 mmHg; BP is well controlled today. 4.  Hyperlipidemia: The patient is not at goal (LDL >55; very high risk) with atorvastatin 80 mg and Zetia.   We will change to Crestor 40 mg and continue Zetia.  We will check lipid panel, LFTs, and LP(a) in 2 months.  We will consider pharmacy referral or referral to Dr. Debara [name] depending on these results. 5.  History of stroke.  It looks like most of her cerebral pathology due to lacunar infarctions from uncontrolled hypertension.  She does not have cortical infarctions.  I do not think a bubble study echocardiogram is needed at this point. 6.  Aortic atherosclerosis: Continue Plavix in lieu of aspirin, statin and Zetia as detailed above with goal LDL less than 55.    Dispo:  Return in about 6 months (around 12/26/2021).      Medication Adjustments/Labs and Tests  Ordered: Current medicines are reviewed at length with the patient today.  Concerns regarding medicines are outlined above.  The following changes have been made:     Labs/tests ordered: Orders Placed This Encounter  Procedures   Lipid Profile   Hepatic function panel   Lipoprotein A (LPA)   EKG 12-Lead   ECHOCARDIOGRAM COMPLETE    Medication Changes: Meds ordered this encounter  Medications   rosuvastatin (CRESTOR) 40 MG tablet    Sig: Take 1 tablet (40 mg total) by mouth daily.    Dispense:  90 tablet    Refill:  3    Patient to stop Atorvastatin   empagliflozin (JARDIANCE) 10 MG TABS tablet    Sig: Take 1 tablet (10 mg total) by mouth daily before breakfast.    Dispense:  30 tablet    Refill:  11     Current medicines are reviewed at length with the patient today.  The patient does not have concerns regarding medicines.   History of Present Illness:    FOCUSED PROBLEM LIST:   1.  Coronary artery disease status post 3V CABG 2018 [facility], [place], [place]) 2.  Type 2 diabetes on insulin 4.  Hypertension 5.  Hyperlipidemia 6.  Pheochromocytoma status post resection 7.  History of multiple strokes with last in 2022; CT demonstrated a lacunar and cerebellar infarction and MRI demonstrated multiple remote infarctions with multiple chronic microhemorrhages likely due to hypertensive etiology (no cortical infarctions); residual left-sided weakness  8.  Aortic atherosclerosis on CT abdomen pelvis 2022 9.  IV dye allergy   The patient is a 51 y.o. female with the indicated medical history here to establish cardiovascular care.  She unfortunately had a pretty severe stroke last year.  She has had multiple strokes.  She then moved to [place] as this is where she is from.  She has been participating in physical therapy to get stronger.  In terms of other symptoms she denies any recurrent signs or symptoms of stroke, significant shortness of breath, exertional angina, exertional  dyspnea, orthopnea, or paroxysmal nocturnal dyspnea.  She has fortunately not required any emergency room visits or hospitalizations recently.  She does not smoke or drink.     Current Medications: Current Meds  Medication Sig   acetaminophen (TYLENOL) 325 MG tablet Take 2 tablets (650 mg total) by mouth every 6 (six) hours as needed for mild pain or headache.   amLODipine (NORVASC) 10 MG tablet Take 1 tablet (10 mg total) by mouth daily. For elevated BP- please call PCP for refills- in future   blood glucose meter kit and supplies KIT Dispense based on patient and insurance preference. Use up to four times daily as directed.   butalbital-acetaminophen-caffeine (FIORICET) 50-325-40 MG tablet TAKE 1 TABLET BY MOUTH EVERY 6 HOURS AS NEEDED FOR MIGRANE   clopidogrel (PLAVIX) 75 MG tablet TAKE 1 TABLET BY MOUTH ONCE DAILY   Continuous Blood Gluc Receiver (FREESTYLE LIBRE 2 READER) DEVI Use to check blood sugar 3 times daily.   Continuous Blood Gluc Sensor (FREESTYLE LIBRE 2 SENSOR) MISC Use to check blood sugar 3 times daily.   diclofenac Sodium (VOLTAREN) 1 % GEL Apply 4 g topically 4 (four) times daily.   empagliflozin (JARDIANCE) 10 MG TABS tablet Take 1 tablet (10 mg total) by mouth daily before breakfast.   escitalopram (LEXAPRO) 20 MG tablet TAKE 1 TABLET BY MOUTH EVERY MORNING   ezetimibe (ZETIA) 10 MG tablet Take 1 tablet (10 mg total) by mouth daily.   fluconazole (DIFLUCAN) 100 MG tablet Take one by mouth x 1 dose then repeat dose in one week   gabapentin (NEURONTIN) 100 MG capsule TAKE 1 CAPSULE BY MOUTH THREE TIMES DAILY (BREAKFAST, LUNCH, BEDTIME)   hydrOXYzine (ATARAX) 25 MG tablet Take 1 tablet (25 mg total) by mouth at bedtime.   insulin aspart (NOVOLOG FLEXPEN) 100 UNIT/ML FlexPen INJECT PER SLIDING SCALE THREE TIMES DAILY WITH MEALS   insulin glargine (LANTUS SOLOSTAR) 100 UNIT/ML Solostar Pen Inject 32 Units into the skin at bedtime.   Insulin Pen Needle 32G X 4 MM MISC Use to  inject insulin 4 times daily as directed.   lip balm (CARMEX) ointment Apply topically as needed for lip care.   lisinopril (ZESTRIL) 40 MG tablet Take 1 tablet (40 mg total) by mouth daily.   loratadine (CLARITIN) 10 MG tablet Take 1 tablet (10 mg total) by mouth daily.   LORazepam (ATIVAN) 0.5 MG tablet TAKE 1 TABLET BY MOUTH AT BEDTIME   melatonin 3 MG TABS tablet Take 3 tablets (9 mg total) by mouth at bedtime.   methimazole (TAPAZOLE) 5 MG tablet TAKE 1 TABLET BY MOUTH EVERY MORNING   metoCLOPramide (REGLAN) 10 MG tablet Take 1 tablet (10 mg total) by mouth 3 (three) times daily before meals.   metoprolol tartrate (LOPRESSOR) 100 MG tablet TAKE 1 TABLET BY MOUTH TWICE DAILY (BREAKFAST, BEDTIME)   nystatin (MYCOSTATIN/NYSTOP) powder USE 1 APPLICATION TWICE DAILY   ondansetron (ZOFRAN) 4 MG  tablet Take 1 tablet (4 mg total) by mouth every 6 (six) hours.   pantoprazole (PROTONIX) 40 MG tablet TAKE 1 TABLET BY MOUTH EVERY MORNING   polyethylene glycol powder (GLYCOLAX/MIRALAX) 17 GM/SCOOP powder Take 17 g by mouth 2 (two) times daily.   rosuvastatin (CRESTOR) 40 MG tablet Take 1 tablet (40 mg total) by mouth daily.   senna (SENOKOT) 8.6 MG tablet Take 1 tablet by mouth as needed for constipation.   tamsulosin (FLOMAX) 0.4 MG CAPS capsule TAKE 1 CAPSULE BY MOUTH AT BEDTIME   topiramate (TOPAMAX) 25 MG tablet TAKE 1 TABLET(25 MG) BY MOUTH TWICE DAILY   [DISCONTINUED] atorvastatin (LIPITOR) 80 MG tablet TAKE 1 TABLET BY MOUTH AT BEDTIME     Allergies:    Sumatriptan, Contrast media [iodinated contrast media], and Penicillins   Social History:   Social History   Tobacco Use   Smoking status: Never   Smokeless tobacco: Never  Vaping Use   Vaping Use: Never used  Substance Use Topics   Alcohol use: Not Currently   Drug use: Never     Family Hx: Family History  Problem Relation Age of Onset   Diabetes Mother    Hypertension Father    Heart attack Father    Dementia Father     Diabetes Brother    Brain cancer Brother    Asthma Daughter    Sickle cell anemia Son      Review of Systems:   Please see the history of present illness.    All other systems reviewed and are negative.     EKGs/Labs/Other Test Reviewed:    EKG:  EKG performed Jun 11, 2021 that I personally reviewed demonstrates sinus tachycardia with PVCs; EKG today that I personally reviewed demonstrates normal sinus rhythm with nonspecific ST and T wave changes  Prior CV studies:  TTE 2022 demonstrate ejection fraction of 60 to 65% with moderate left ventricular hypertrophy and no significant valve abnormalities; no bubble study was performed  Other studies Reviewed: Review of the additional studies/records demonstrates: Aortic atherosclerosis CT abdomen pelvis 2022  Recent Labs: 06/11/2021: ALT 19; BUN 12; Creatinine, Ser 0.70; Potassium 4.1; Sodium 138; TSH 1.072 06/21/2021: Hemoglobin 14.4; Magnesium 1.6; Platelets 394   Recent Lipid Panel Lab Results  Component Value Date/Time   CHOL 183 04/24/2021 10:37 AM   TRIG 143 04/24/2021 10:37 AM   HDL 49 (L) 04/24/2021 10:37 AM   LDLCALC 108 (H) 04/24/2021 10:37 AM    Risk Assessment/Calculations:          Physical Exam:    VS:  BP 120/78   Pulse 91   Ht $R'5\' 2"'xe$  (1.575 m)   Wt 204 lb 3.2 oz (92.6 kg)   SpO2 94%   BMI 37.35 kg/m    Wt Readings from Last 3 Encounters:  06/25/21 204 lb 3.2 oz (92.6 kg)  04/08/21 213 lb 13.5 oz (97 kg)  03/19/21 213 lb 13.5 oz (97 kg)    GENERAL:  No apparent distress, AOx3 HEENT:  No carotid bruits, +2 carotid impulses, no scleral icterus CAR: RRR no murmurs, gallops, rubs, or thrills RES:  Clear to auscultation bilaterally ABD:  Soft, nontender, nondistended, positive bowel sounds x 4 VASC:  +2 radial pulses, +2 carotid pulses, palpable pedal pulses NEURO:  CN 2-12 grossly intact; left-sided weakness PSYCH:  No active depression or anxiety EXT:  No edema, ecchymosis, or cyanosis  Signed, Early Osmond, MD  06/25/2021 4:10 PM    [facility] [place], [place] [employer], [place]  [address] Phone: [phone]; Fax: [phone]   Note:  This document was prepared using Dragon voice recognition software and may include unintentional dictation errors.

## 2021-06-25 ENCOUNTER — Ambulatory Visit (INDEPENDENT_AMBULATORY_CARE_PROVIDER_SITE_OTHER): Payer: Medicaid Other | Admitting: Internal Medicine

## 2021-06-25 ENCOUNTER — Encounter: Payer: Self-pay | Admitting: Internal Medicine

## 2021-06-25 VITALS — BP 120/78 | HR 91 | Ht 62.0 in | Wt 204.2 lb

## 2021-06-25 DIAGNOSIS — E1159 Type 2 diabetes mellitus with other circulatory complications: Secondary | ICD-10-CM

## 2021-06-25 DIAGNOSIS — Z951 Presence of aortocoronary bypass graft: Secondary | ICD-10-CM | POA: Diagnosis not present

## 2021-06-25 DIAGNOSIS — I639 Cerebral infarction, unspecified: Secondary | ICD-10-CM

## 2021-06-25 DIAGNOSIS — I152 Hypertension secondary to endocrine disorders: Secondary | ICD-10-CM

## 2021-06-25 DIAGNOSIS — E785 Hyperlipidemia, unspecified: Secondary | ICD-10-CM

## 2021-06-25 DIAGNOSIS — Z794 Long term (current) use of insulin: Secondary | ICD-10-CM

## 2021-06-25 DIAGNOSIS — E1169 Type 2 diabetes mellitus with other specified complication: Secondary | ICD-10-CM | POA: Diagnosis not present

## 2021-06-25 DIAGNOSIS — E118 Type 2 diabetes mellitus with unspecified complications: Secondary | ICD-10-CM

## 2021-06-25 DIAGNOSIS — I700 Atherosclerosis of aorta: Secondary | ICD-10-CM

## 2021-06-25 MED ORDER — EMPAGLIFLOZIN 10 MG PO TABS: 10.0000 mg | ORAL_TABLET | Freq: Every day | ORAL | 11 refills | Status: DC

## 2021-06-25 MED ORDER — ROSUVASTATIN CALCIUM 40 MG PO TABS: 40.0000 mg | ORAL_TABLET | Freq: Every day | ORAL | 3 refills | Status: DC

## 2021-06-25 NOTE — Patient Instructions (Signed)
Medication Instructions:  Your physician has recommended you make the following change in your medication: Stop Atorvastatin. Start Rosuvastatin 40 mg by mouth daily. Start Jardiance 10 mg by mouth daily  *If you need a refill on your cardiac medications before your next appointment, please call your pharmacy*   Lab Work: Your physician recommends that you return for lab work in: 2 months.  Lipid, Liver and LipoproteinA.  This will be fasting  If you have labs (blood work) drawn today and your tests are completely normal, you will receive your results only by: [place] (if you have MyChart) OR A paper copy in the mail If you have any lab test that is abnormal or we need to change your treatment, we will call you to review the results.   Testing/Procedures: Your physician has requested that you have an echocardiogram. Echocardiography is a painless test that uses sound waves to create images of your heart. It provides your doctor with information about the size and shape of your heart and how well your heart's chambers and valves are working. This procedure takes approximately one hour. There are no restrictions for this procedure.    Follow-Up: At [facility], you and your health needs are our priority.  As part of our continuing mission to provide you with exceptional heart care, we have created designated Provider Care Teams.  These Care Teams include your primary Cardiologist (physician) and Advanced Practice Providers (APPs -  Physician Assistants and Nurse Practitioners) who all work together to provide you with the care you need, when you need it.  We recommend signing up for the patient portal called "MyChart".  Sign up information is provided on this After Visit Summary.  MyChart is used to connect with patients for Virtual Visits (Telemedicine).  Patients are able to view lab/test results, encounter notes, upcoming appointments, etc.  Non-urgent messages can be sent to  your provider as well.   To learn more about what you can do with MyChart, go to [email or web address].    Your next appointment:   6 month(s)  The format for your next appointment:   In Person  Provider:   Early Osmond, MD     Other Instructions    Important Information About Sugar

## 2021-06-26 ENCOUNTER — Telehealth: Payer: Self-pay | Admitting: *Deleted

## 2021-06-26 NOTE — Telephone Encounter (Signed)
Received fax from [place] for Prior Authorization for Topiramate '25mg'$ .   Filled out form and placed in Dinah's folder to review and sign. To be faxed back to [facility] Fax: [phone] once completed.

## 2021-06-27 ENCOUNTER — Telehealth: Payer: Self-pay

## 2021-06-27 NOTE — Telephone Encounter (Signed)
Called [facility] to check on order and whether it had been received I spoke with Nira Conn. She stated that they have not received order and physician notes need to be sent with order. She will fax over form to be filled out by physician as well. Once they receive that form,order and notes then order will be processed.  Lori [name] [phone]

## 2021-06-27 NOTE — Telephone Encounter (Signed)
Received fax from [employer] ( [facility]) stating medication has been approved for 05/27/2021- 06/28/2022

## 2021-06-29 ENCOUNTER — Encounter: Payer: Medicaid Other | Admitting: Physical Medicine & Rehabilitation

## 2021-06-29 ENCOUNTER — Telehealth: Payer: Self-pay | Admitting: *Deleted

## 2021-06-29 ENCOUNTER — Telehealth: Payer: Self-pay | Admitting: Physical Medicine and Rehabilitation

## 2021-06-29 NOTE — Telephone Encounter (Signed)
[name] with [facility] called requesting a Current Medication list to be faxed to their office Fax: [phone].   Faxed.

## 2021-06-29 NOTE — Telephone Encounter (Signed)
Patient has questions about allergic reaction to Botox

## 2021-07-03 ENCOUNTER — Encounter: Payer: Medicaid Other | Attending: Physical Medicine and Rehabilitation | Admitting: Physical Medicine & Rehabilitation

## 2021-07-03 ENCOUNTER — Encounter: Payer: Self-pay | Admitting: Physical Medicine & Rehabilitation

## 2021-07-03 VITALS — BP 138/99 | HR 100 | Ht 62.0 in | Wt 204.2 lb

## 2021-07-03 DIAGNOSIS — G8194 Hemiplegia, unspecified affecting left nondominant side: Secondary | ICD-10-CM | POA: Insufficient documentation

## 2021-07-03 NOTE — Progress Notes (Signed)
Botox Injection for spasticity using needle EMG guidance  Dilution: 50 Units/ml Indication: Severe spasticity which interferes with ADL,mobility and/or  hygiene and is unresponsive to medication management and other conservative care Informed consent was obtained after describing risks and benefits of the procedure with the patient. This includes bleeding, bruising, infection, excessive weakness, or medication side effects. A REMS form is on file and signed. Needle: 27g 1" needle electrode Number of units per muscle Total dose 300U Biceps100 FCR50 FCU0 FDS50 FDP50 FPL25 PT 25 All injections were done after obtaining appropriate EMG activity and after negative drawback for blood. Chlorhexidine prep due to betadine allergy The patient tolerated the procedure well. Post procedure instructions were given. A followup appointment was made.

## 2021-07-03 NOTE — Patient Instructions (Signed)
If the botox was helpful with Left arm tightness tell Dr Dagoberto Ligas so she can refer you again  You received a Botox injection today. You may experience soreness at the needle injection sites. Please call us if any of the injection sites turns red after a couple days or if there is any drainage. You may experience muscle weakness as a result of Botox. This would improve with time but can take several weeks to improve. The Botox should start working in about one week. The Botox usually last 3 months. The injection can be repeated every 3 months as needed.

## 2021-07-05 ENCOUNTER — Telehealth: Payer: Self-pay | Admitting: *Deleted

## 2021-07-05 DIAGNOSIS — E1169 Type 2 diabetes mellitus with other specified complication: Secondary | ICD-10-CM

## 2021-07-05 MED ORDER — LANTUS SOLOSTAR 100 UNIT/ML ~~LOC~~ SOPN: 28.0000 [IU] | PEN_INJECTOR | Freq: Every day | SUBCUTANEOUS | 4 refills | Status: DC

## 2021-07-05 NOTE — Telephone Encounter (Signed)
Katelyn with [facility] called with Concerns.  Stated that patient's Blood Sugar has been low since Cardiologist placed patient on Jardiance '10mg'$  Blood Sugars: 5/31- 54 6/1-   24  All other vitals are normal. No other symptoms noted.   Please Advise.

## 2021-07-05 NOTE — Addendum Note (Signed)
Addended by: Rafael Bihari A on: 07/05/2021 04:54 PM   Modules accepted: Orders

## 2021-07-05 NOTE — Telephone Encounter (Signed)
Katelyn with [facility] notified and agreed and stated that she will call the patient with instructions and have her call our office to schedule an appointment.

## 2021-07-05 NOTE — Telephone Encounter (Signed)
Reduce Lantus from 32 units to 28 units SQ and follow up in one week for evaluation of blood sugars.please blood sugar log to visit.

## 2021-07-06 ENCOUNTER — Other Ambulatory Visit: Payer: Self-pay | Admitting: Family

## 2021-07-06 DIAGNOSIS — E1169 Type 2 diabetes mellitus with other specified complication: Secondary | ICD-10-CM

## 2021-07-08 ENCOUNTER — Other Ambulatory Visit: Payer: Self-pay | Admitting: Family Medicine

## 2021-07-08 DIAGNOSIS — E1169 Type 2 diabetes mellitus with other specified complication: Secondary | ICD-10-CM

## 2021-07-09 ENCOUNTER — Telehealth: Payer: Self-pay | Admitting: *Deleted

## 2021-07-09 NOTE — Telephone Encounter (Signed)
Noted  

## 2021-07-09 NOTE — Telephone Encounter (Signed)
[name] with [facility] and stated that she saw patient this morning.   Stated that patient's blood sugar was 455 but she did not take her Lantus Last night.   Stated that patient's blood pressure was 190/129. Patient stated that she took her blood pressure medication but threw it back up this morning.   Patient has an appointment tomorrow 07/10/2021. Does NOT want to go to hospital.   Please Advise.

## 2021-07-10 ENCOUNTER — Encounter: Payer: Self-pay | Admitting: Family

## 2021-07-10 ENCOUNTER — Emergency Department (HOSPITAL_COMMUNITY): Payer: Medicare Other

## 2021-07-10 ENCOUNTER — Emergency Department: Payer: Self-pay

## 2021-07-10 ENCOUNTER — Ambulatory Visit (HOSPITAL_COMMUNITY): Payer: Medicaid Other | Attending: Internal Medicine

## 2021-07-10 ENCOUNTER — Inpatient Hospital Stay (HOSPITAL_COMMUNITY)
Admission: EM | Admit: 2021-07-10 | Discharge: 2021-07-18 | DRG: 637 | Disposition: A | Discharge status: Home Health | Payer: Medicare Other | Attending: Internal Medicine | Admitting: Internal Medicine

## 2021-07-10 ENCOUNTER — Encounter (HOSPITAL_COMMUNITY): Payer: Self-pay

## 2021-07-10 ENCOUNTER — Inpatient Hospital Stay (HOSPITAL_COMMUNITY): Payer: Medicare Other

## 2021-07-10 ENCOUNTER — Encounter (HOSPITAL_COMMUNITY): Payer: Self-pay | Admitting: Radiology

## 2021-07-10 ENCOUNTER — Other Ambulatory Visit: Payer: Self-pay

## 2021-07-10 ENCOUNTER — Ambulatory Visit (INDEPENDENT_AMBULATORY_CARE_PROVIDER_SITE_OTHER): Payer: Medicaid Other | Admitting: Family

## 2021-07-10 VITALS — BP 130/68 | HR 138 | Temp 97.3°F | Resp 20 | Ht 62.0 in | Wt 204.0 lb

## 2021-07-10 DIAGNOSIS — E669 Obesity, unspecified: Secondary | ICD-10-CM | POA: Diagnosis present

## 2021-07-10 DIAGNOSIS — F32A Depression, unspecified: Secondary | ICD-10-CM | POA: Diagnosis present

## 2021-07-10 DIAGNOSIS — I69322 Dysarthria following cerebral infarction: Secondary | ICD-10-CM

## 2021-07-10 DIAGNOSIS — Z79899 Other long term (current) drug therapy: Secondary | ICD-10-CM | POA: Diagnosis not present

## 2021-07-10 DIAGNOSIS — I69391 Dysphagia following cerebral infarction: Secondary | ICD-10-CM

## 2021-07-10 DIAGNOSIS — E1100 Type 2 diabetes mellitus with hyperosmolarity without nonketotic hyperglycemic-hyperosmolar coma (NKHHC): Principal | ICD-10-CM | POA: Diagnosis present

## 2021-07-10 DIAGNOSIS — Z880 Allergy status to penicillin: Secondary | ICD-10-CM | POA: Diagnosis not present

## 2021-07-10 DIAGNOSIS — Z7902 Long term (current) use of antithrombotics/antiplatelets: Secondary | ICD-10-CM | POA: Diagnosis not present

## 2021-07-10 DIAGNOSIS — E876 Hypokalemia: Secondary | ICD-10-CM | POA: Diagnosis not present

## 2021-07-10 DIAGNOSIS — R112 Nausea with vomiting, unspecified: Secondary | ICD-10-CM

## 2021-07-10 DIAGNOSIS — R531 Weakness: Secondary | ICD-10-CM

## 2021-07-10 DIAGNOSIS — J690 Pneumonitis due to inhalation of food and vomit: Secondary | ICD-10-CM | POA: Diagnosis present

## 2021-07-10 DIAGNOSIS — Z7984 Long term (current) use of oral hypoglycemic drugs: Secondary | ICD-10-CM | POA: Diagnosis not present

## 2021-07-10 DIAGNOSIS — I639 Cerebral infarction, unspecified: Secondary | ICD-10-CM

## 2021-07-10 DIAGNOSIS — I69320 Aphasia following cerebral infarction: Secondary | ICD-10-CM | POA: Diagnosis not present

## 2021-07-10 DIAGNOSIS — R000 Tachycardia, unspecified: Secondary | ICD-10-CM | POA: Diagnosis present

## 2021-07-10 DIAGNOSIS — K859 Acute pancreatitis without necrosis or infection, unspecified: Secondary | ICD-10-CM | POA: Diagnosis present

## 2021-07-10 DIAGNOSIS — E785 Hyperlipidemia, unspecified: Secondary | ICD-10-CM

## 2021-07-10 DIAGNOSIS — E1143 Type 2 diabetes mellitus with diabetic autonomic (poly)neuropathy: Secondary | ICD-10-CM | POA: Diagnosis present

## 2021-07-10 DIAGNOSIS — E079 Disorder of thyroid, unspecified: Secondary | ICD-10-CM

## 2021-07-10 DIAGNOSIS — I10 Essential (primary) hypertension: Secondary | ICD-10-CM | POA: Diagnosis present

## 2021-07-10 DIAGNOSIS — K5900 Constipation, unspecified: Secondary | ICD-10-CM | POA: Diagnosis not present

## 2021-07-10 DIAGNOSIS — K3184 Gastroparesis: Secondary | ICD-10-CM | POA: Diagnosis present

## 2021-07-10 DIAGNOSIS — Z8249 Family history of ischemic heart disease and other diseases of the circulatory system: Secondary | ICD-10-CM

## 2021-07-10 DIAGNOSIS — I69354 Hemiplegia and hemiparesis following cerebral infarction affecting left non-dominant side: Secondary | ICD-10-CM | POA: Diagnosis not present

## 2021-07-10 DIAGNOSIS — I2510 Atherosclerotic heart disease of native coronary artery without angina pectoris: Secondary | ICD-10-CM | POA: Diagnosis present

## 2021-07-10 DIAGNOSIS — R1310 Dysphagia, unspecified: Secondary | ICD-10-CM | POA: Diagnosis present

## 2021-07-10 DIAGNOSIS — R7989 Other specified abnormal findings of blood chemistry: Secondary | ICD-10-CM | POA: Diagnosis present

## 2021-07-10 DIAGNOSIS — Z6837 Body mass index (BMI) 37.0-37.9, adult: Secondary | ICD-10-CM

## 2021-07-10 DIAGNOSIS — E039 Hypothyroidism, unspecified: Secondary | ICD-10-CM

## 2021-07-10 DIAGNOSIS — Z794 Long term (current) use of insulin: Secondary | ICD-10-CM | POA: Diagnosis not present

## 2021-07-10 DIAGNOSIS — E1169 Type 2 diabetes mellitus with other specified complication: Secondary | ICD-10-CM

## 2021-07-10 DIAGNOSIS — E0590 Thyrotoxicosis, unspecified without thyrotoxic crisis or storm: Secondary | ICD-10-CM | POA: Diagnosis present

## 2021-07-10 DIAGNOSIS — Z8673 Personal history of transient ischemic attack (TIA), and cerebral infarction without residual deficits: Secondary | ICD-10-CM

## 2021-07-10 DIAGNOSIS — Z808 Family history of malignant neoplasm of other organs or systems: Secondary | ICD-10-CM

## 2021-07-10 DIAGNOSIS — Z888 Allergy status to other drugs, medicaments and biological substances status: Secondary | ICD-10-CM

## 2021-07-10 DIAGNOSIS — F419 Anxiety disorder, unspecified: Secondary | ICD-10-CM | POA: Diagnosis not present

## 2021-07-10 DIAGNOSIS — Z825 Family history of asthma and other chronic lower respiratory diseases: Secondary | ICD-10-CM

## 2021-07-10 DIAGNOSIS — Z832 Family history of diseases of the blood and blood-forming organs and certain disorders involving the immune mechanism: Secondary | ICD-10-CM

## 2021-07-10 DIAGNOSIS — Z91041 Radiographic dye allergy status: Secondary | ICD-10-CM

## 2021-07-10 DIAGNOSIS — Z833 Family history of diabetes mellitus: Secondary | ICD-10-CM

## 2021-07-10 DIAGNOSIS — Z951 Presence of aortocoronary bypass graft: Secondary | ICD-10-CM

## 2021-07-10 DIAGNOSIS — I6930 Unspecified sequelae of cerebral infarction: Secondary | ICD-10-CM

## 2021-07-10 LAB — URINALYSIS, ROUTINE W REFLEX MICROSCOPIC
Bilirubin Urine: NEGATIVE
Glucose, UA: >=500 mg/dL — AB
Ketones, ur: NEGATIVE mg/dL
Nitrite: NEGATIVE
Protein, ur: NEGATIVE mg/dL
Specific Gravity, Urine: 1.022 (ref 1.005–1.030)
pH: 5 (ref 5.0–8.0)

## 2021-07-10 LAB — BASIC METABOLIC PANEL
Anion gap: 16 — ABNORMAL HIGH (ref 5–15)
Anion gap: 11 (ref 5–15)
Anion gap: 9 (ref 5–15)
BUN: 16 mg/dL (ref 6–20)
BUN: 12 mg/dL (ref 6–20)
BUN: 14 mg/dL (ref 6–20)
CO2: 26 mmol/L (ref 22–32)
CO2: 22 mmol/L (ref 22–32)
CO2: 27 mmol/L (ref 22–32)
Calcium: 8.9 mg/dL (ref 8.9–10.3)
Calcium: 7.6 mg/dL — ABNORMAL LOW (ref 8.9–10.3)
Calcium: 8.1 mg/dL — ABNORMAL LOW (ref 8.9–10.3)
Chloride: 87 mmol/L — ABNORMAL LOW (ref 98–111)
Chloride: 101 mmol/L (ref 98–111)
Chloride: 101 mmol/L (ref 98–111)
Creatinine, Ser: 1.15 mg/dL — ABNORMAL HIGH (ref 0.44–1.00)
Creatinine, Ser: 0.56 mg/dL (ref 0.44–1.00)
Creatinine, Ser: 0.67 mg/dL (ref 0.44–1.00)
GFR, Estimated: 59 mL/min — ABNORMAL LOW (ref >60)
GFR, Estimated: >60 mL/min (ref >60)
GFR, Estimated: >60 mL/min (ref >60)
Glucose, Bld: 677 mg/dL (ref 70–99)
Glucose, Bld: 136 mg/dL — ABNORMAL HIGH (ref 70–99)
Glucose, Bld: 90 mg/dL (ref 70–99)
Potassium: 3.5 mmol/L (ref 3.5–5.1)
Potassium: 4.2 mmol/L (ref 3.5–5.1)
Potassium: 3 mmol/L — ABNORMAL LOW (ref 3.5–5.1)
Sodium: 129 mmol/L — ABNORMAL LOW (ref 135–145)
Sodium: 134 mmol/L — ABNORMAL LOW (ref 135–145)
Sodium: 137 mmol/L (ref 135–145)

## 2021-07-10 LAB — BLOOD GAS, VENOUS
Acid-Base Excess: 4 mmol/L — ABNORMAL HIGH (ref 0.0–2.0)
Bicarbonate: 29.7 mmol/L — ABNORMAL HIGH (ref 20.0–28.0)
O2 Saturation: 32.5 %
Patient temperature: 37
pCO2, Ven: 48 mmHg (ref 44–60)
pH, Ven: 7.4 (ref 7.25–7.43)
pO2, Ven: <31 mmHg — CL (ref 32–45)

## 2021-07-10 LAB — CBC WITH DIFFERENTIAL/PLATELET
Abs Immature Granulocytes: 0.16 10*3/uL — ABNORMAL HIGH (ref 0.00–0.07)
Basophils Absolute: 0.1 10*3/uL (ref 0.0–0.1)
Basophils Relative: 0 %
Eosinophils Absolute: 0 10*3/uL (ref 0.0–0.5)
Eosinophils Relative: 0 %
HCT: 43 % (ref 36.0–46.0)
Hemoglobin: 14.5 g/dL (ref 12.0–15.0)
Immature Granulocytes: 1 %
Lymphocytes Relative: 8 %
Lymphs Abs: 1.7 10*3/uL (ref 0.7–4.0)
MCH: 26.1 pg (ref 26.0–34.0)
MCHC: 33.7 g/dL (ref 30.0–36.0)
MCV: 77.3 fL — ABNORMAL LOW (ref 80.0–100.0)
Monocytes Absolute: 1 10*3/uL (ref 0.1–1.0)
Monocytes Relative: 4 %
Neutro Abs: 19.9 10*3/uL — ABNORMAL HIGH (ref 1.7–7.7)
Neutrophils Relative %: 87 %
Platelets: 364 10*3/uL (ref 150–400)
RBC: 5.56 MIL/uL — ABNORMAL HIGH (ref 3.87–5.11)
RDW: 15.8 % — ABNORMAL HIGH (ref 11.5–15.5)
WBC: 22.8 10*3/uL — ABNORMAL HIGH (ref 4.0–10.5)
nRBC: 0 % (ref 0.0–0.2)

## 2021-07-10 LAB — TROPONIN I (HIGH SENSITIVITY)
Troponin I (High Sensitivity): 21 ng/L — ABNORMAL HIGH (ref <18)
Troponin I (High Sensitivity): 14 ng/L (ref <18)

## 2021-07-10 LAB — BETA-HYDROXYBUTYRIC ACID
Beta-Hydroxybutyric Acid: 0.17 mmol/L (ref 0.05–0.27)
Beta-Hydroxybutyric Acid: 0.08 mmol/L (ref 0.05–0.27)

## 2021-07-10 LAB — GLUCOSE, CAPILLARY
Glucose-Capillary: 140 mg/dL — ABNORMAL HIGH (ref 70–99)
Glucose-Capillary: 155 mg/dL — ABNORMAL HIGH (ref 70–99)
Glucose-Capillary: 155 mg/dL — ABNORMAL HIGH (ref 70–99)
Glucose-Capillary: 175 mg/dL — ABNORMAL HIGH (ref 70–99)

## 2021-07-10 LAB — I-STAT CHEM 8, ED
BUN: 15 mg/dL (ref 6–20)
Calcium, Ion: 1.05 mmol/L — ABNORMAL LOW (ref 1.15–1.40)
Chloride: 86 mmol/L — ABNORMAL LOW (ref 98–111)
Creatinine, Ser: 1 mg/dL (ref 0.44–1.00)
Glucose, Bld: 694 mg/dL (ref 70–99)
HCT: 49 % — ABNORMAL HIGH (ref 36.0–46.0)
Hemoglobin: 16.7 g/dL — ABNORMAL HIGH (ref 12.0–15.0)
Potassium: 3.5 mmol/L (ref 3.5–5.1)
Sodium: 126 mmol/L — ABNORMAL LOW (ref 135–145)
TCO2: 25 mmol/L (ref 22–32)

## 2021-07-10 LAB — HEPATIC FUNCTION PANEL
ALT: 14 U/L (ref 0–44)
AST: 13 U/L — ABNORMAL LOW (ref 15–41)
Albumin: 3.5 g/dL (ref 3.5–5.0)
Alkaline Phosphatase: 116 U/L (ref 38–126)
Bilirubin, Direct: 0.2 mg/dL (ref 0.0–0.2)
Indirect Bilirubin: 1 mg/dL — ABNORMAL HIGH (ref 0.3–0.9)
Total Bilirubin: 1.2 mg/dL (ref 0.3–1.2)
Total Protein: 8 g/dL (ref 6.5–8.1)

## 2021-07-10 LAB — CBG MONITORING, ED
Glucose-Capillary: >600 mg/dL (ref 70–99)
Glucose-Capillary: 392 mg/dL — ABNORMAL HIGH (ref 70–99)
Glucose-Capillary: 250 mg/dL — ABNORMAL HIGH (ref 70–99)
Glucose-Capillary: 179 mg/dL — ABNORMAL HIGH (ref 70–99)

## 2021-07-10 LAB — LIPASE, BLOOD: Lipase: 110 U/L — ABNORMAL HIGH (ref 11–51)

## 2021-07-10 LAB — MRSA NEXT GEN BY PCR, NASAL: MRSA by PCR Next Gen: NOT DETECTED

## 2021-07-10 LAB — I-STAT BETA HCG BLOOD, ED (MC, WL, AP ONLY): I-stat hCG, quantitative: <5 m[IU]/mL (ref <5)

## 2021-07-10 LAB — OSMOLALITY: Osmolality: 275 mOsm/kg (ref 275–295)

## 2021-07-10 IMAGING — DX DG ABD PORTABLE 1V
1 series · 1 of 1 positions shown · non-contrast
Comparison: [DATE]

CLINICAL DATA: Nausea and vomiting

EXAM:
PORTABLE ABDOMEN - 1 VIEW

[abdomen kub]
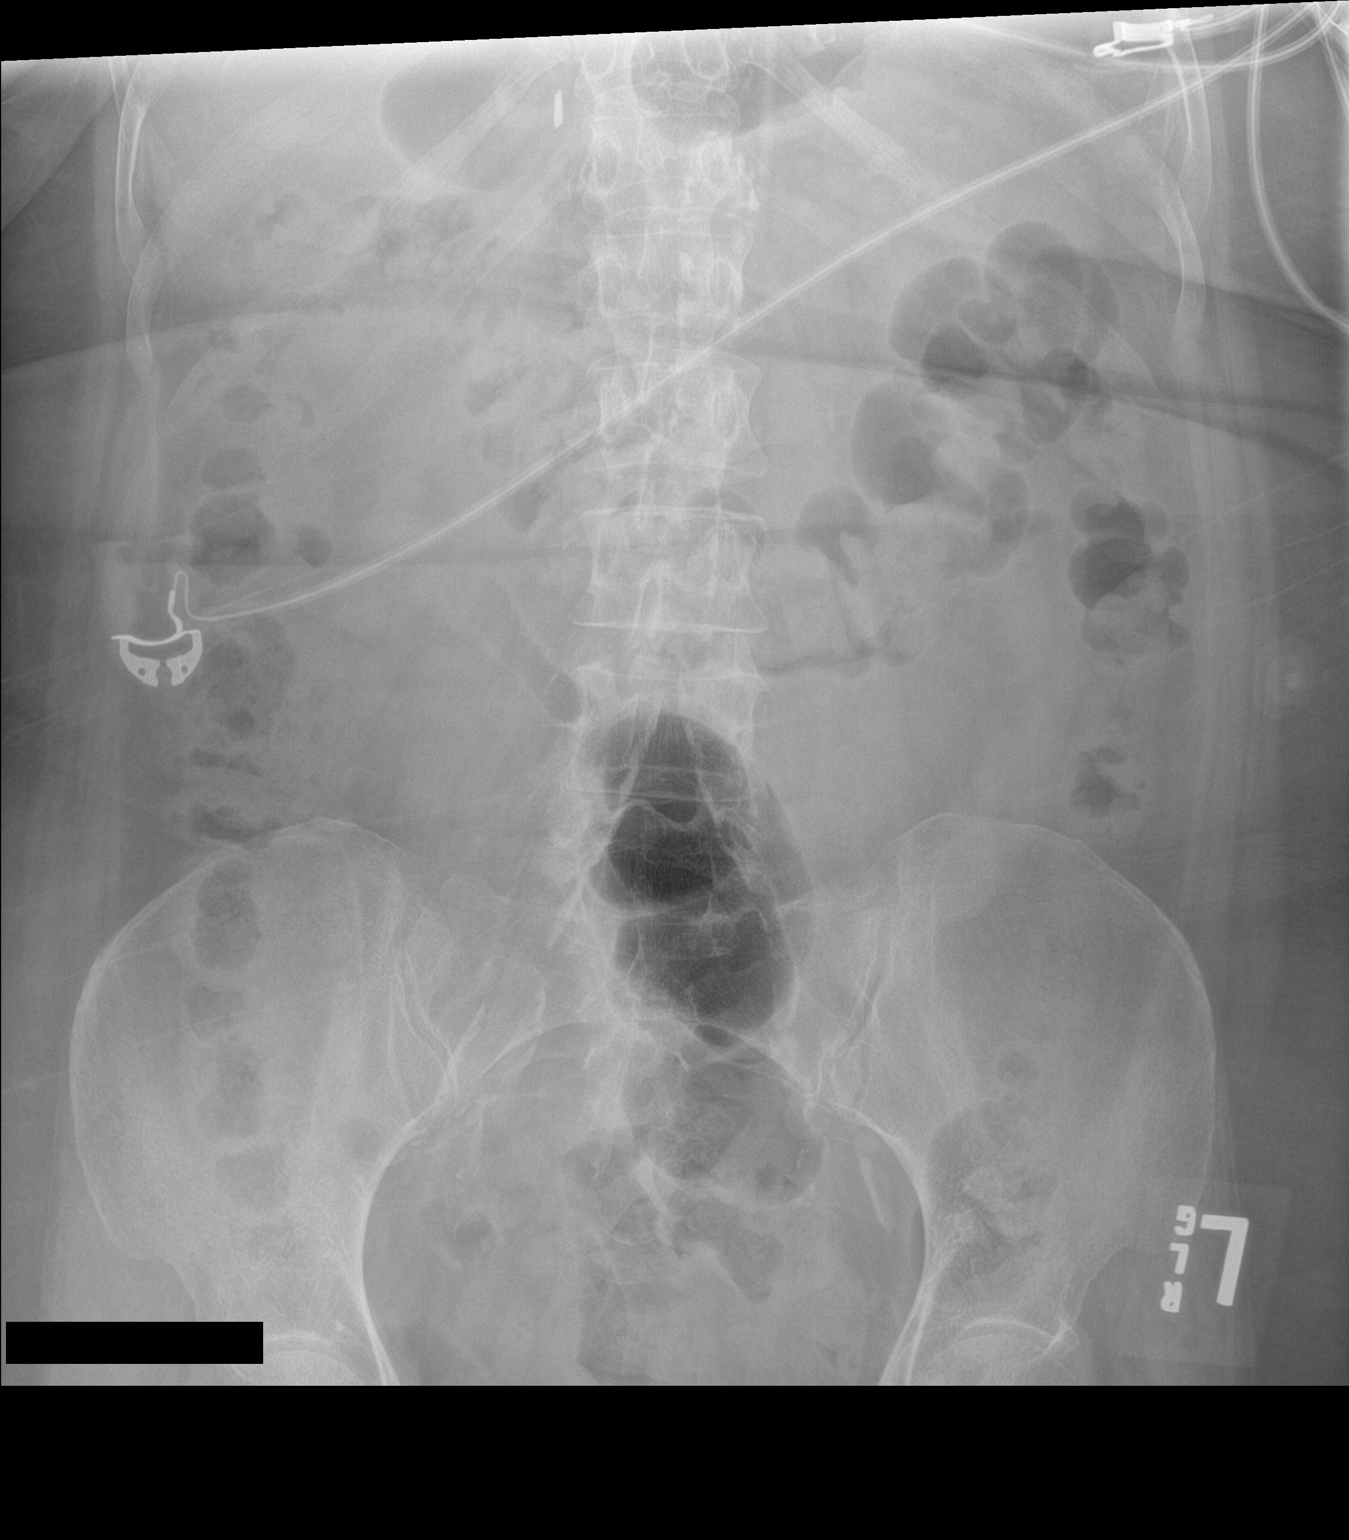

[1 of 1 positions shown; findings below may reference images not displayed]

FINDINGS: The bowel gas pattern is normal. No radio-opaque calculi or other
significant radiographic abnormality are seen. Age advanced
aortoiliac atherosclerotic calcification.
IMPRESSION: 1. Nonobstructive bowel gas pattern.
2. Age advanced aortoiliac atherosclerotic calcification.

## 2021-07-10 IMAGING — DX DG CHEST 1V PORT
1 series · 1 of 1 positions shown · non-contrast
Comparison: [DATE]

CLINICAL DATA: Altered mental status, vomiting.

EXAM:
PORTABLE CHEST 1 VIEW

[chest ap]
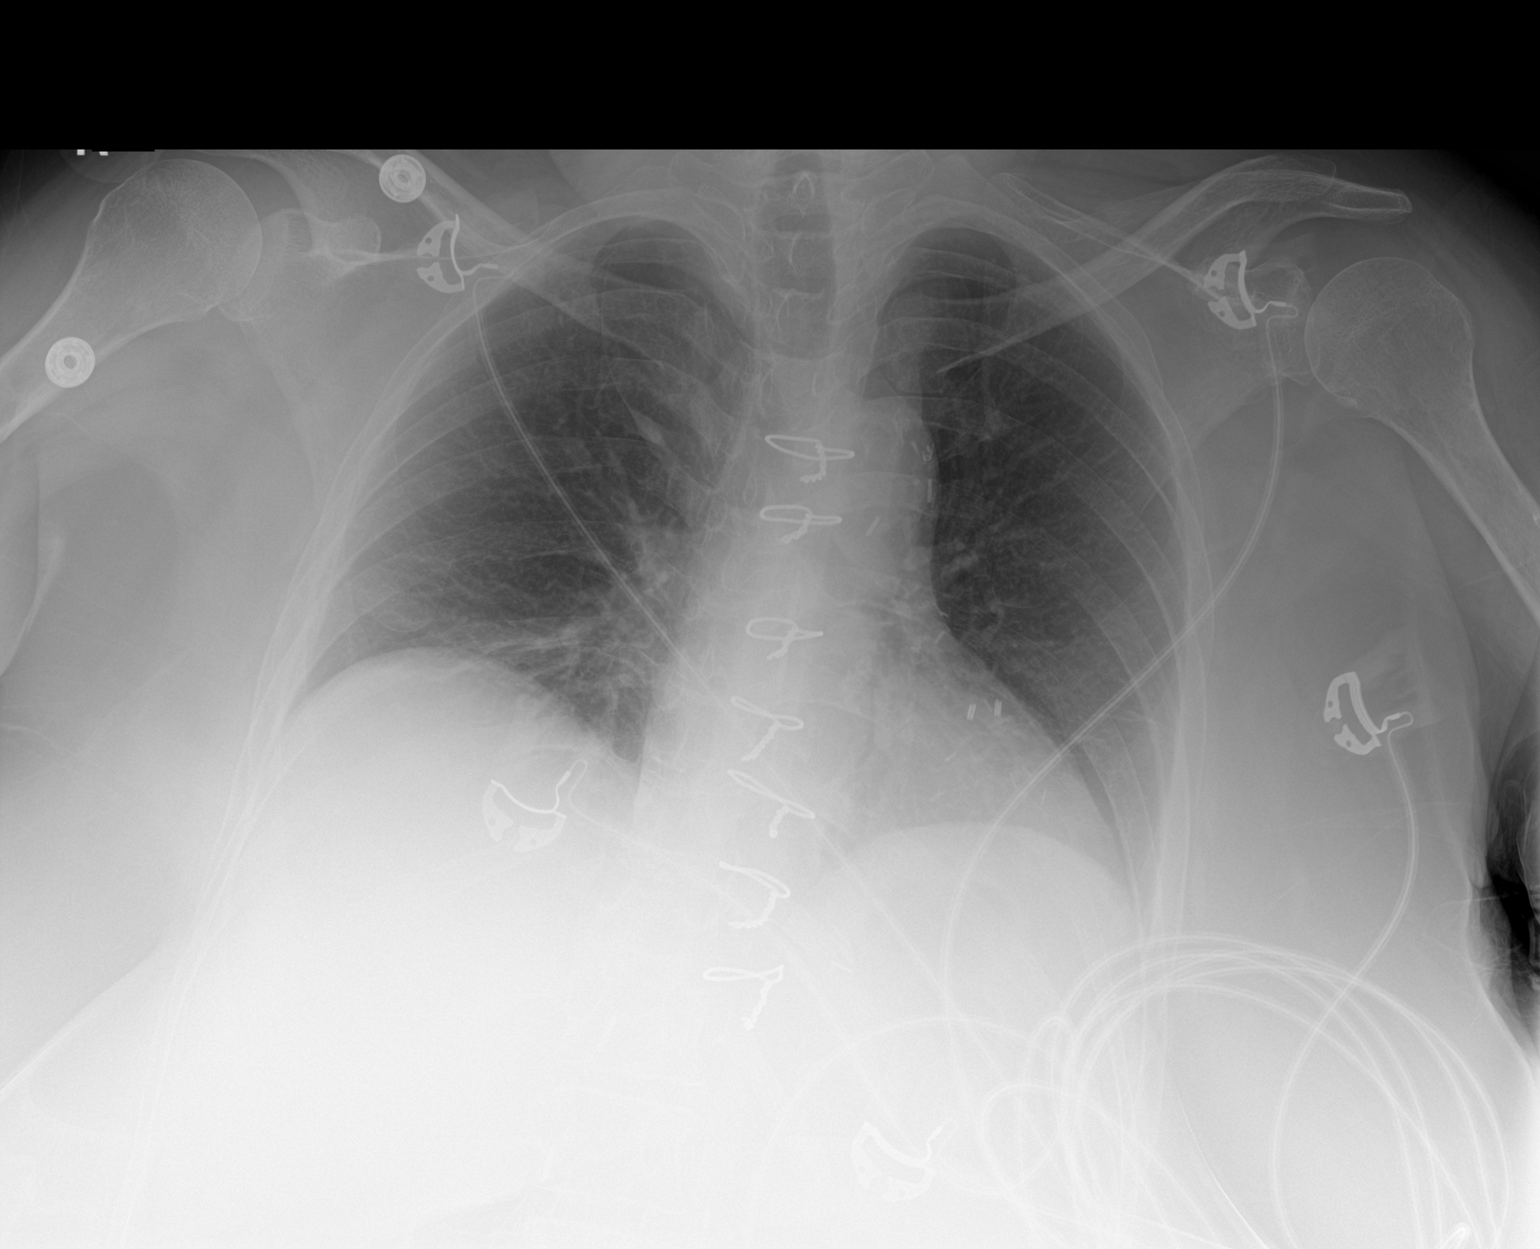

[1 of 1 positions shown; findings below may reference images not displayed]

FINDINGS: Postsurgical changes from CABG.

Cardiomediastinal silhouette is normal. Mediastinal contours appear
intact.

Streaky perihilar and peribronchial opacities in bilateral lower
lobes. Low lung volumes.

Osseous structures are without acute abnormality. Soft tissues are
grossly normal.
IMPRESSION: Streaky perihilar and peribronchial opacities in bilateral lower
lobes may represent atelectasis or peribronchial airspace
consolidation.

## 2021-07-10 IMAGING — CT CT ABD-PELV W/O CM
2 of 4 series · 16 of 46 positions shown, 18 images · non-contrast
Comparison: [DATE] CT abdomen pelvis

CLINICAL DATA: Nausea and vomiting since [REDACTED].



[Series 4: axial st · axial · 0.93mm/px · z∈[-391,+49]mm · 13 of 100 slices shown, 15 images]
[im 6/100  soft-tissue]
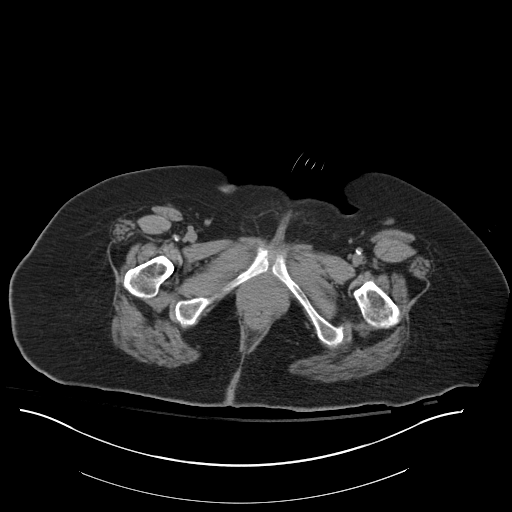
[im 6/100  bone]
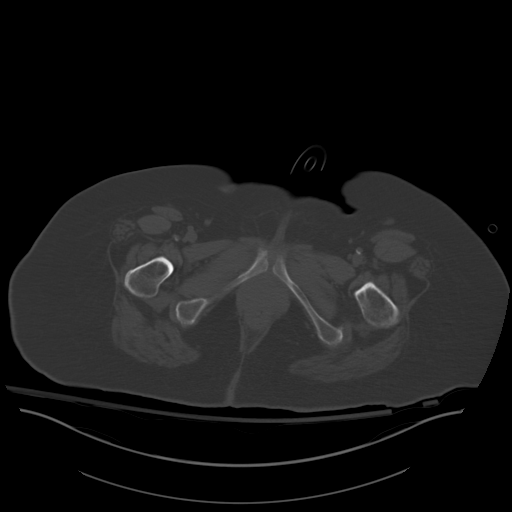
[im 16/100  soft-tissue]
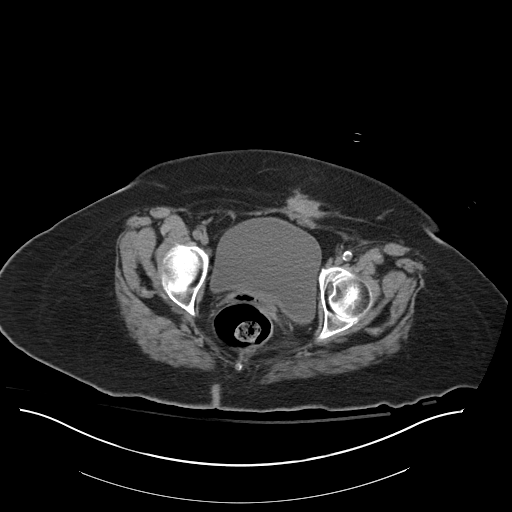
[im 21/100  soft-tissue]
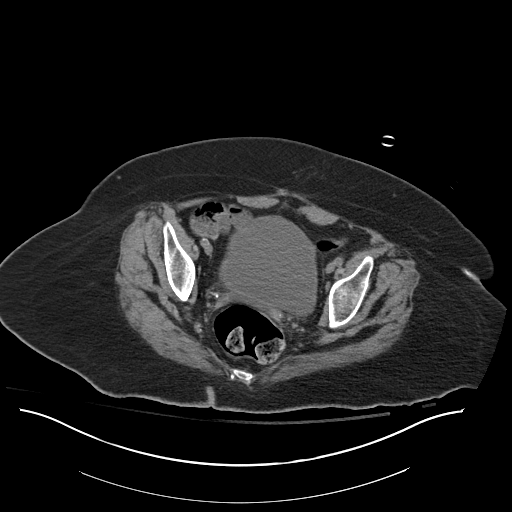
[im 27/100  soft-tissue]
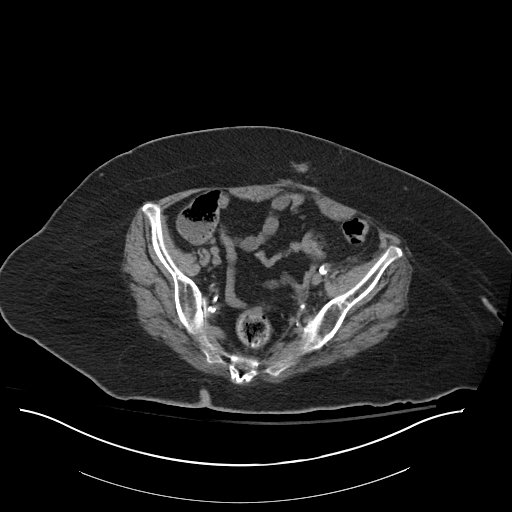
[im 37/100  soft-tissue]
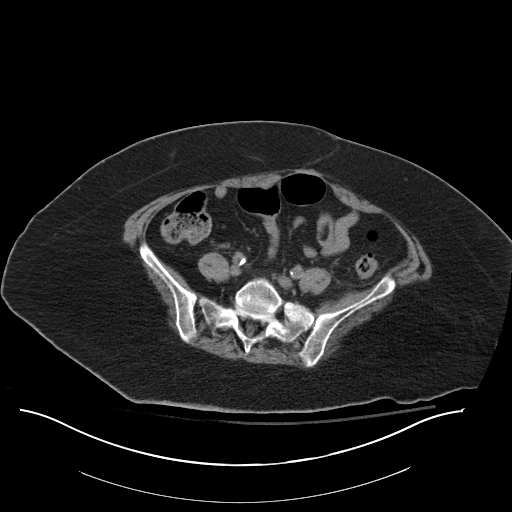
[im 42/100  soft-tissue]
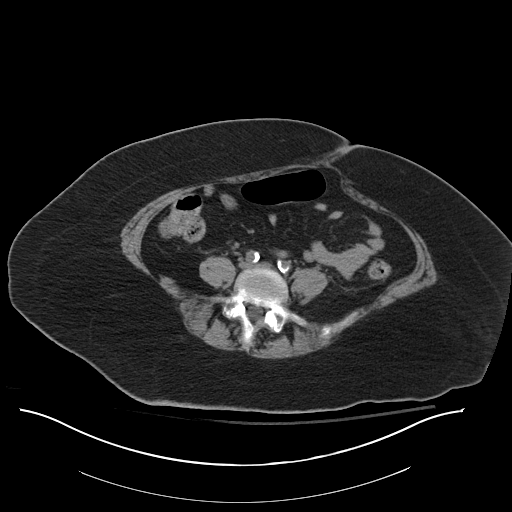
[im 53/100  soft-tissue]
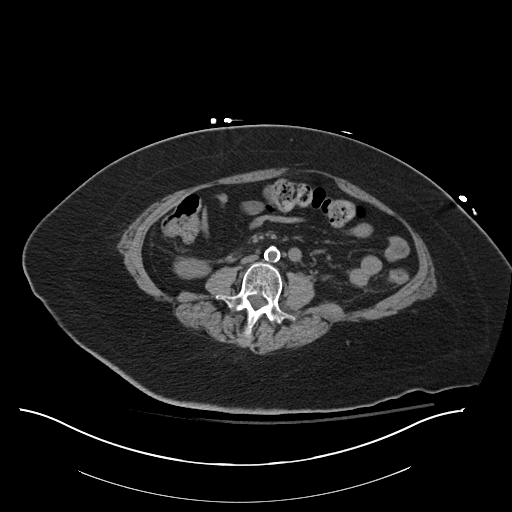
[im 58/100  soft-tissue]
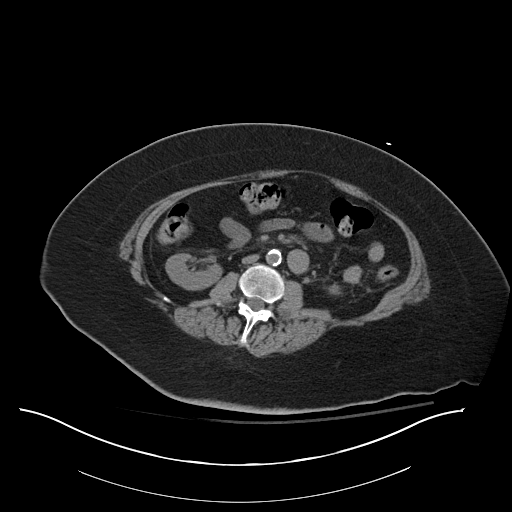
[im 63/100  soft-tissue]
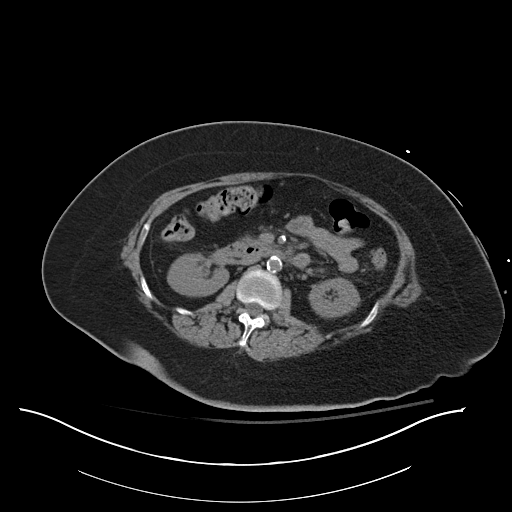
[im 63/100  bone]
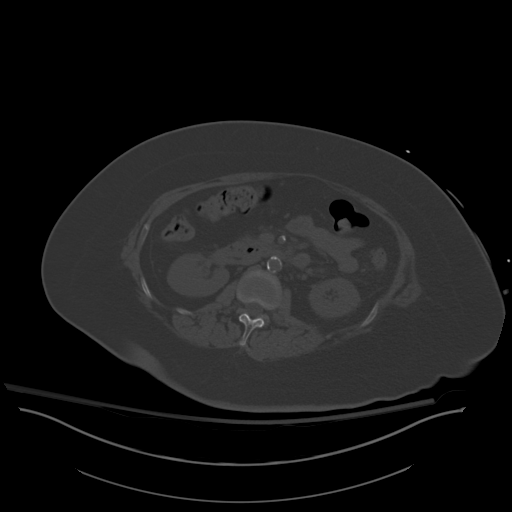
[im 73/100  soft-tissue]
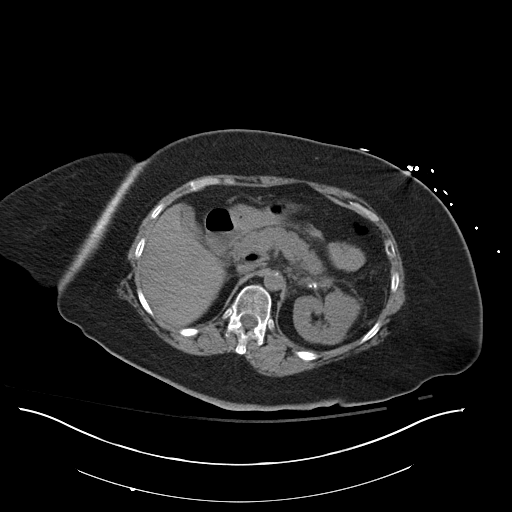
[im 79/100  soft-tissue]
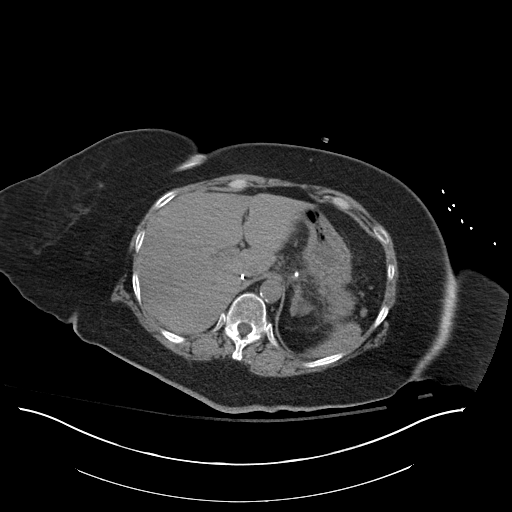
[im 84/100  soft-tissue]
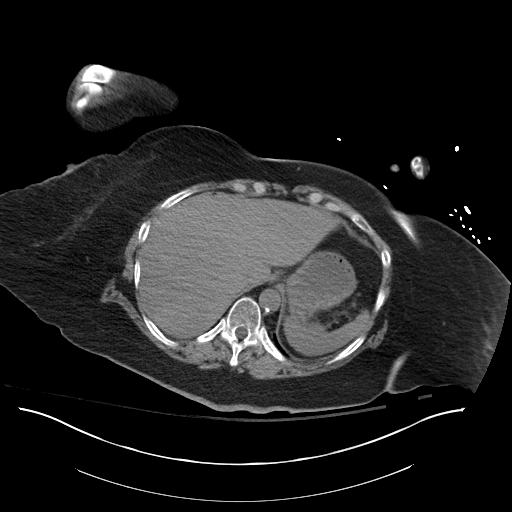
[im 94/100  soft-tissue]
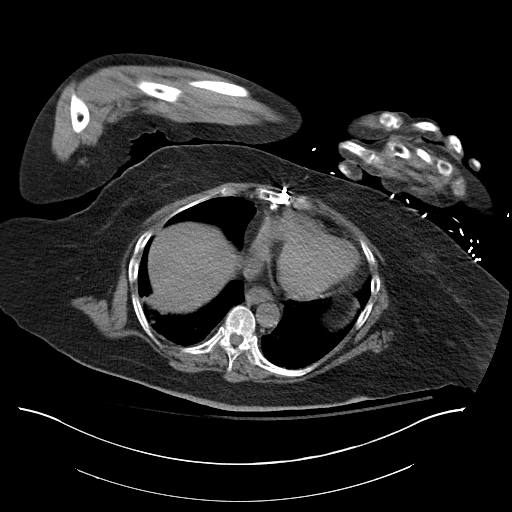

[Series 7: coronal st · coronal · 0.94mm/px · 3 of 167 slices shown]
[im 56/167  soft-tissue]
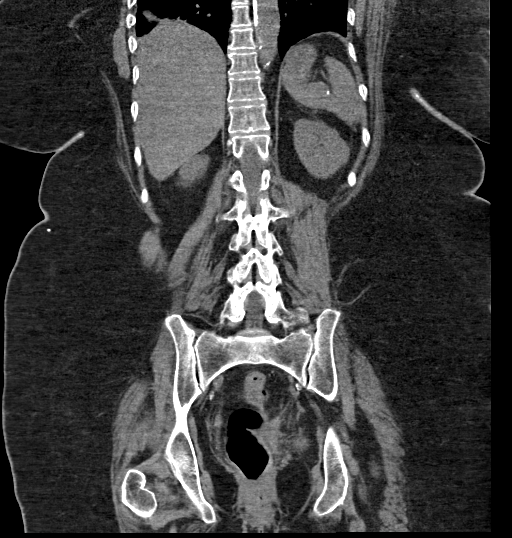
[im 74/167  soft-tissue]
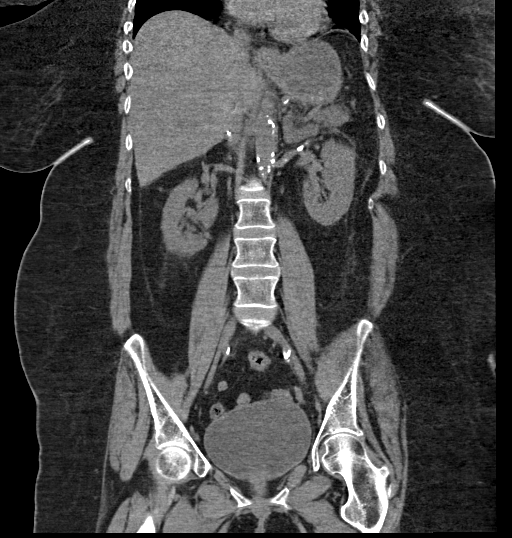
[im 93/167  soft-tissue]
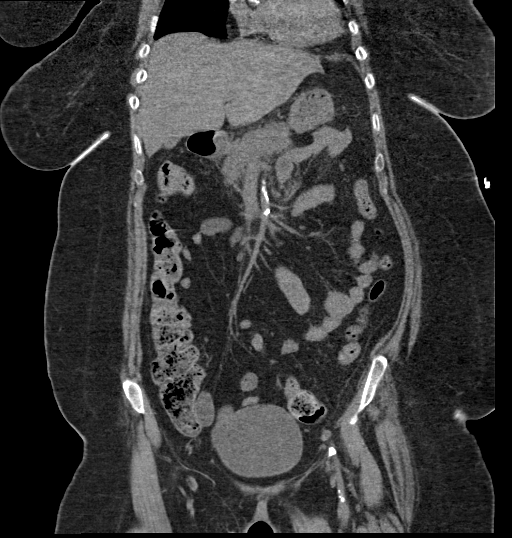

[16 of 46 positions shown; findings below may reference images not displayed]

FINDINGS: Lower chest: Bibasilar atelectasis versus scarring. Prior median
sternotomy and CABG.

Hepatobiliary: Unremarkable noncontrast appearance of the hepatic
parenchyma. Gallbladder is unremarkable. No biliary ductal dilation.

Pancreas: Mild stranding along the pancreatic head may reflect
pancreatitis recommend correlation with serum lipase. No pancreatic
ductal dilation.

Spleen: No splenomegaly.

Adrenals/Urinary Tract: Right adrenal gland is surgically absent.
Mild thickening of the left adrenal gland without discrete
nodularity is similar prior.

No hydronephrosis. No renal, ureteral or bladder calculi. Urinary
bladder is unremarkable for degree of distension.

Stomach/Bowel: No radiopaque enteric contrast material was
administered. Small hiatal hernia otherwise the stomach is
unremarkable for degree of distension. No pathologic dilation of
small or large bowel. Terminal ileum appears normal. The appendix is
not confidently identified however there is no pericecal
inflammation. Evidence of acute bowel inflammation.

Vascular/Lymphatic: Extensive aortic and branch vessel
atherosclerosis without abdominal aortic aneurysm. No pathologically
enlarged abdominal or pelvic lymph nodes.

Reproductive: Status post hysterectomy. No adnexal masses.

Other: No significant abdominopelvic free fluid. No
pneumoperitoneum.

Similar size of the heterogeneous soft tissue nodularity in the
anterior abdominal wall which measures 3.3 x 2.0 cm on image 84/4
previously 3.4 x 2.2 cm, and appears to be at the level of patient's
known C-section scar.

Musculoskeletal: No acute osseous abnormality.
IMPRESSION: 1. No acute abnormality in the abdomen or pelvis on this noncontrast
CT, specifically no evidence of bowel obstruction or acute bowel
inflammation.
2. No significant interval change 3.3 cm heterogeneous soft tissue
density within the deep subcutaneous soft tissues of the lower
anterior abdominal wall potentially related to prior surgery.
3. Prior right adrenalectomy.

## 2021-07-10 IMAGING — CT CT HEAD W/O CM
3 series · 14 of 47 positions shown, 16 images · non-contrast
Comparison: Head CT and brain MRI [DATE]

CLINICAL DATA: Altered mental status. Mental status change of
unknown cause. Headache and vomiting. Chronic left-sided weakness.



[Series 2: head wo · axial · 0.48mm/px · z∈[+1394,+1524]mm · 8 of 32 slices shown, 10 images]
[im 3/32  brain]
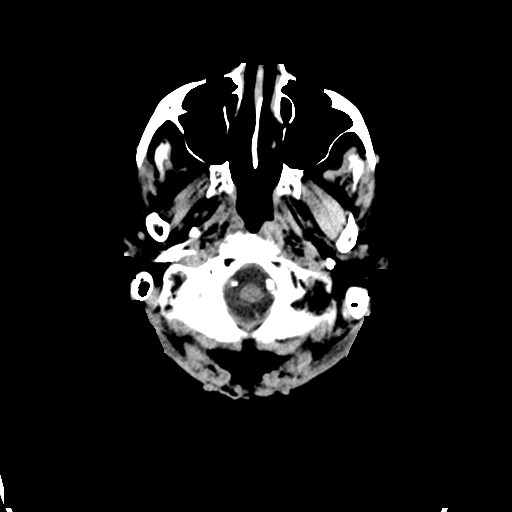
[im 3/32  bone]
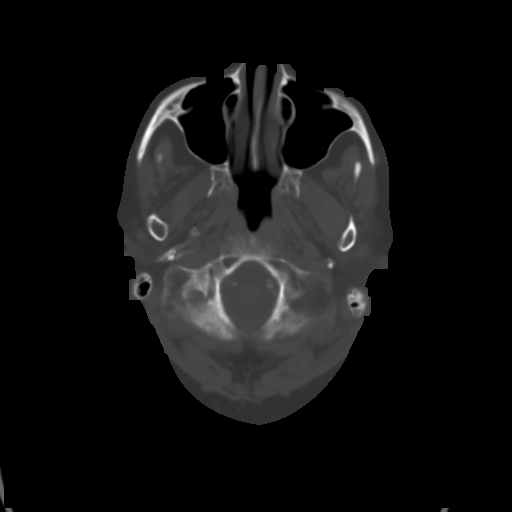
[im 7/32  brain]
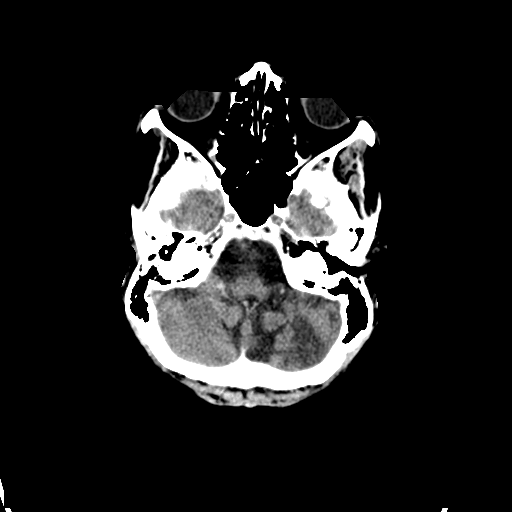
[im 10/32  brain]
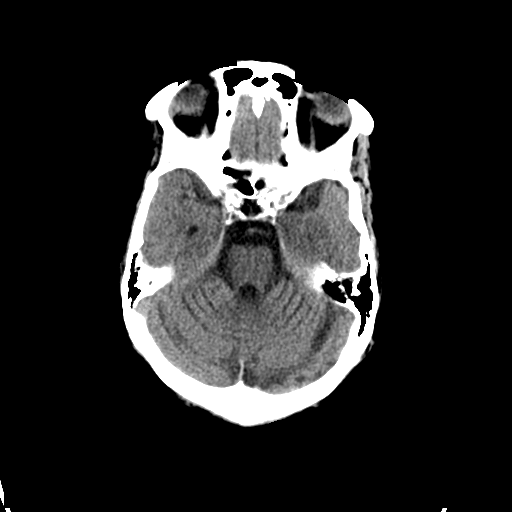
[im 14/32  brain]
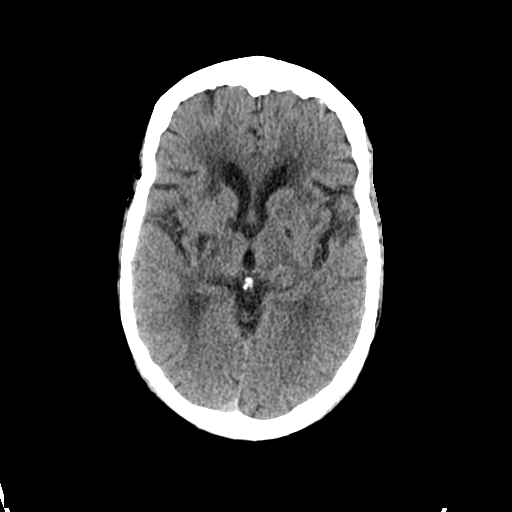
[im 18/32  brain]
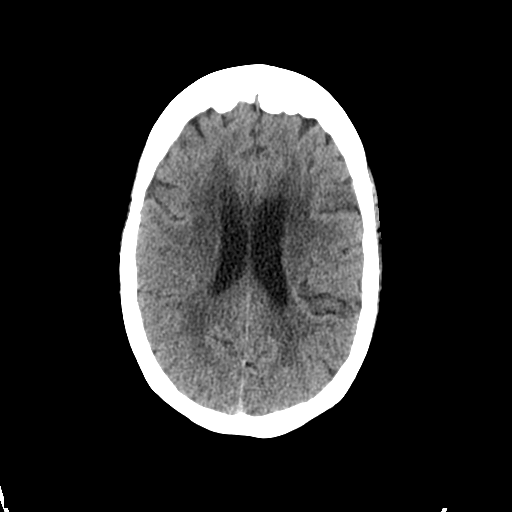
[im 18/32  bone]
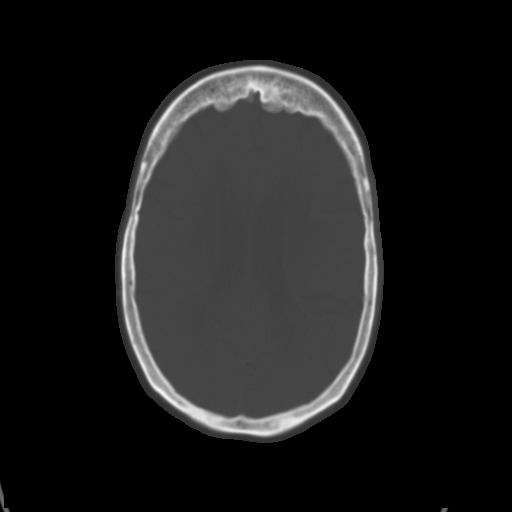
[im 22/32  brain]
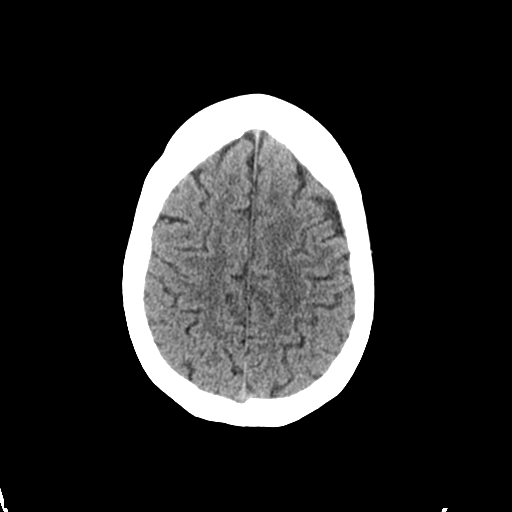
[im 25/32  brain]
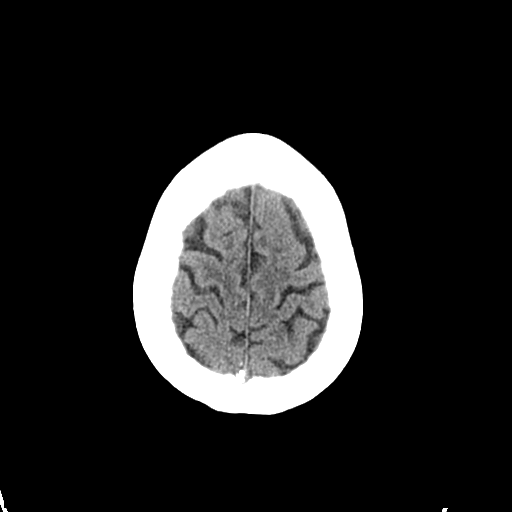
[im 29/32  brain]
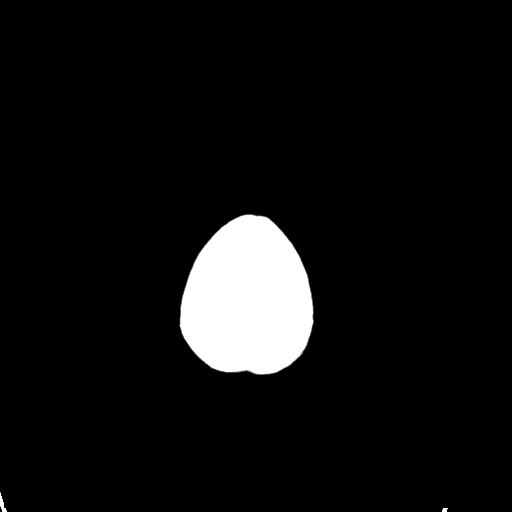

[Series 4: coronal soft tissue · coronal · 0.34mm/px · 3 of 67 slices shown]
[im 23/67  brain]
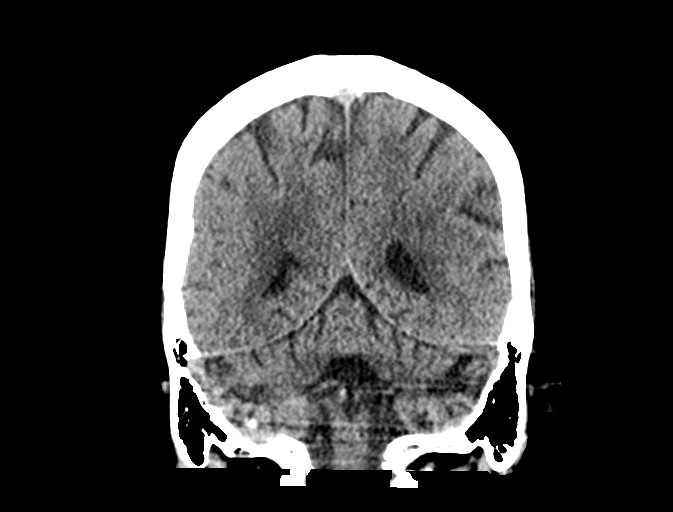
[im 30/67  brain]
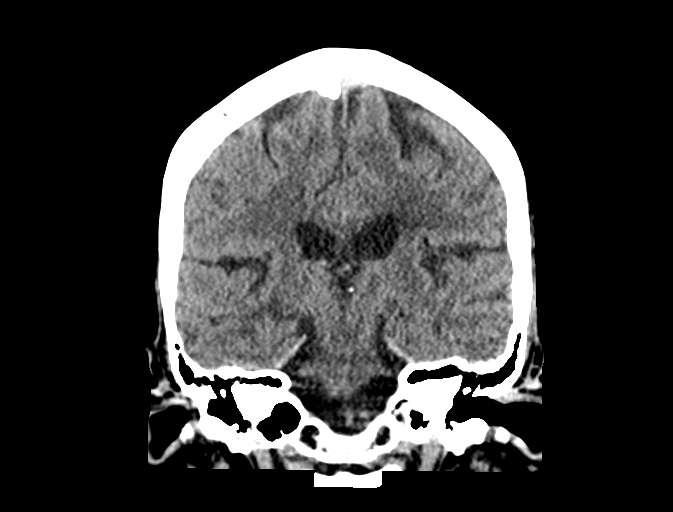
[im 37/67  brain]
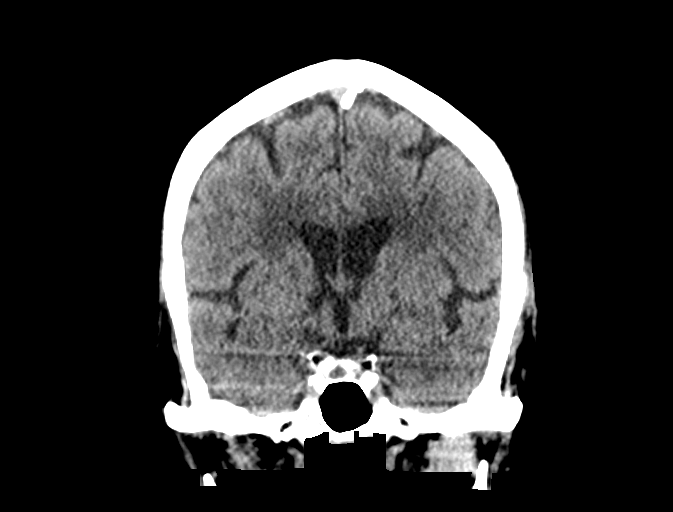

[Series 5: sagittal soft tissue · sagittal · 0.31mm/px · 3 of 46 slices shown]
[im 16/46  brain]
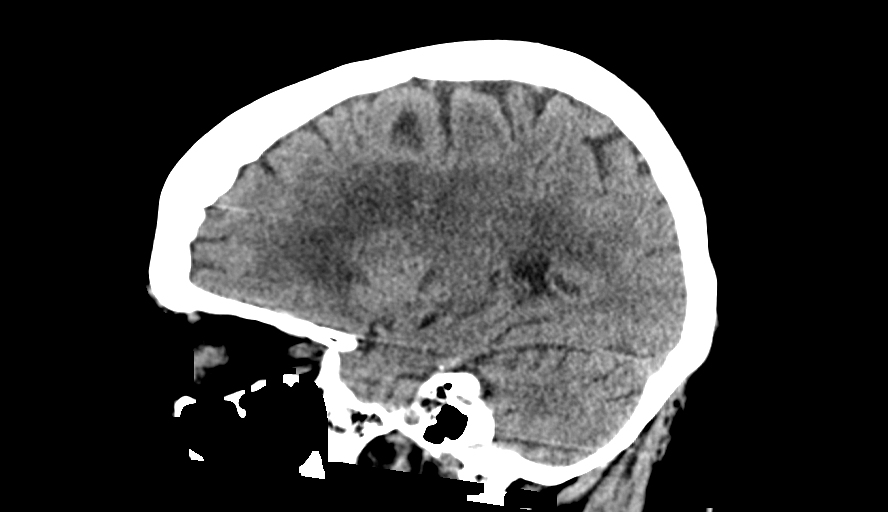
[im 23/46  brain]
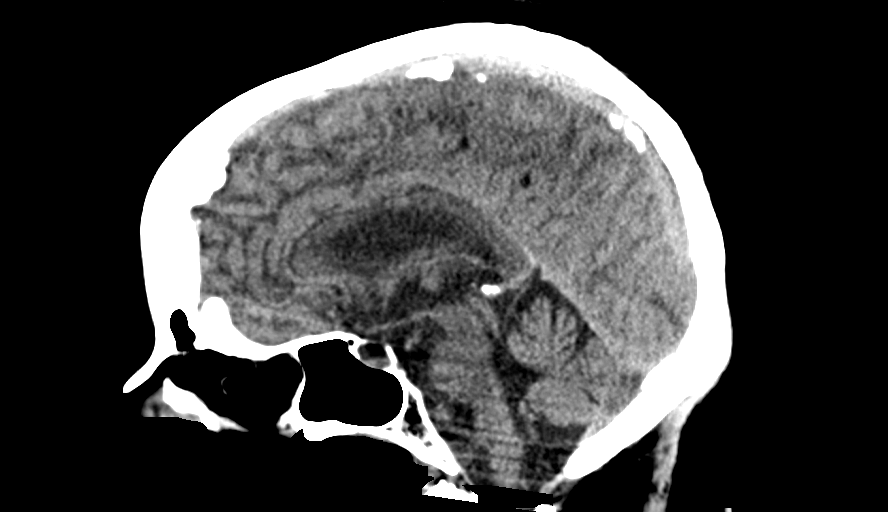
[im 31/46  brain]
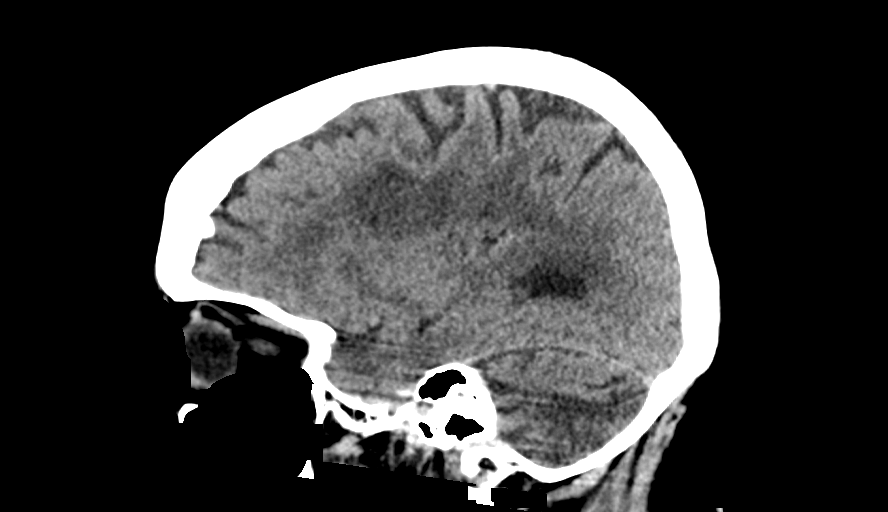

[14 of 47 positions shown; findings below may reference images not displayed]

FINDINGS: Brain: No acute intracranial hemorrhage. There is no evidence of
acute ischemia. Remote lacunar infarcts within bilateral basal
ganglia and remote left cerebellar infarct, unchanged. No
hydrocephalus. Periventricular and deep white matter hypodensity
typical of chronic small vessel ischemia, stable.

Vascular: Atherosclerosis of skullbase vasculature without
hyperdense vessel or abnormal calcification.

Skull: No fracture or focal lesion.

Sinuses/Orbits: Paranasal sinuses and mastoid air cells are clear.
The visualized orbits are unremarkable.

Other: None.
IMPRESSION: 1. No acute intracranial abnormality.
2. Remote left cerebellar infarct, chronic basal gangliar lacunar
infarcts and age advanced chronic small vessel ischemia.

## 2021-07-10 MED ORDER — LACTATED RINGERS IV BOLUS
20.0000 mL/kg | Freq: Once | INTRAVENOUS | Status: AC
  Administered 2021-07-10: 1850 mL via INTRAVENOUS

## 2021-07-10 MED ORDER — SODIUM CHLORIDE 0.9% FLUSH
10.0000 mL | Freq: Two times a day (BID) | INTRAVENOUS | Status: DC
  Administered 2021-07-10 – 2021-07-11 (×3): 10 mL
  Administered 2021-07-11: 20 mL
  Administered 2021-07-12 – 2021-07-17 (×8): 10 mL

## 2021-07-10 MED ORDER — INSULIN REGULAR(HUMAN) IN NACL 100-0.9 UT/100ML-% IV SOLN
INTRAVENOUS | Status: DC
  Administered 2021-07-10: 15 [IU]/h via INTRAVENOUS
  Administered 2021-07-10: 4.4 [IU]/h via INTRAVENOUS
  Filled 2021-07-10: qty 100

## 2021-07-10 MED ORDER — HYDROMORPHONE HCL 1 MG/ML IJ SOLN
0.5000 mg | INTRAMUSCULAR | Status: DC | PRN
  Administered 2021-07-11 – 2021-07-12 (×2): 0.5 mg via INTRAVENOUS
  Filled 2021-07-10 (×2): qty 0.5

## 2021-07-10 MED ORDER — INSULIN GLARGINE-YFGN 100 UNIT/ML ~~LOC~~ SOLN
10.0000 [IU] | Freq: Once | SUBCUTANEOUS | Status: AC
  Administered 2021-07-11: 10 [IU] via SUBCUTANEOUS
  Filled 2021-07-10: qty 0.1

## 2021-07-10 MED ORDER — LACTATED RINGERS IV BOLUS
1000.0000 mL | Freq: Once | INTRAVENOUS | Status: AC
  Administered 2021-07-10: 1000 mL via INTRAVENOUS

## 2021-07-10 MED ORDER — METOCLOPRAMIDE HCL 5 MG/ML IJ SOLN
10.0000 mg | Freq: Three times a day (TID) | INTRAMUSCULAR | Status: DC
  Administered 2021-07-10 – 2021-07-17 (×21): 10 mg via INTRAVENOUS
  Filled 2021-07-10 (×21): qty 2

## 2021-07-10 MED ORDER — CHLORHEXIDINE GLUCONATE CLOTH 2 % EX PADS
6.0000 | MEDICATED_PAD | Freq: Every day | CUTANEOUS | Status: DC
  Administered 2021-07-10 – 2021-07-18 (×9): 6 via TOPICAL

## 2021-07-10 MED ORDER — INSULIN ASPART 100 UNIT/ML IJ SOLN
0.0000 [IU] | Freq: Every day | INTRAMUSCULAR | Status: DC
  Administered 2021-07-11 – 2021-07-14 (×4): 2 [IU] via SUBCUTANEOUS

## 2021-07-10 MED ORDER — INSULIN ASPART 100 UNIT/ML IJ SOLN
0.0000 [IU] | Freq: Three times a day (TID) | INTRAMUSCULAR | Status: DC
  Administered 2021-07-11 (×2): 8 [IU] via SUBCUTANEOUS
  Administered 2021-07-11: 5 [IU] via SUBCUTANEOUS
  Administered 2021-07-12: 8 [IU] via SUBCUTANEOUS
  Administered 2021-07-12: 11 [IU] via SUBCUTANEOUS
  Administered 2021-07-12: 5 [IU] via SUBCUTANEOUS
  Administered 2021-07-13: 11 [IU] via SUBCUTANEOUS
  Administered 2021-07-13: 5 [IU] via SUBCUTANEOUS
  Administered 2021-07-13: 2 [IU] via SUBCUTANEOUS
  Administered 2021-07-14: 3 [IU] via SUBCUTANEOUS
  Administered 2021-07-14: 8 [IU] via SUBCUTANEOUS
  Administered 2021-07-14: 5 [IU] via SUBCUTANEOUS
  Administered 2021-07-15 (×3): 3 [IU] via SUBCUTANEOUS
  Administered 2021-07-16: 5 [IU] via SUBCUTANEOUS
  Administered 2021-07-16: 3 [IU] via SUBCUTANEOUS
  Administered 2021-07-16 – 2021-07-17 (×2): 8 [IU] via SUBCUTANEOUS
  Administered 2021-07-17: 5 [IU] via SUBCUTANEOUS
  Administered 2021-07-17: 3 [IU] via SUBCUTANEOUS
  Administered 2021-07-18: 2 [IU] via SUBCUTANEOUS

## 2021-07-10 MED ORDER — LACTATED RINGERS IV BOLUS: 1000.0000 mL | Freq: Once | INTRAVENOUS | Status: DC

## 2021-07-10 MED ORDER — ONDANSETRON HCL 4 MG/2ML IJ SOLN: 4.0000 mg | Freq: Four times a day (QID) | INTRAMUSCULAR | Status: DC | PRN

## 2021-07-10 MED ORDER — METOPROLOL TARTRATE 5 MG/5ML IV SOLN
5.0000 mg | Freq: Four times a day (QID) | INTRAVENOUS | Status: DC
  Administered 2021-07-10 – 2021-07-13 (×11): 5 mg via INTRAVENOUS
  Filled 2021-07-10 (×11): qty 5

## 2021-07-10 MED ORDER — INSULIN REGULAR(HUMAN) IN NACL 100-0.9 UT/100ML-% IV SOLN
INTRAVENOUS | Status: DC
  Filled 2021-07-10: qty 100

## 2021-07-10 MED ORDER — DEXTROSE 50 % IV SOLN: 0.0000 mL | INTRAVENOUS | Status: DC | PRN

## 2021-07-10 MED ORDER — DEXTROSE IN LACTATED RINGERS 5 % IV SOLN: INTRAVENOUS | Status: DC

## 2021-07-10 MED ORDER — LACTATED RINGERS IV SOLN: INTRAVENOUS | Status: DC

## 2021-07-10 MED ORDER — ONDANSETRON HCL 4 MG/2ML IJ SOLN
4.0000 mg | Freq: Once | INTRAMUSCULAR | Status: AC
  Administered 2021-07-10: 4 mg via INTRAVENOUS
  Filled 2021-07-10: qty 2

## 2021-07-10 MED ORDER — ENOXAPARIN SODIUM 40 MG/0.4ML IJ SOSY
40.0000 mg | PREFILLED_SYRINGE | INTRAMUSCULAR | Status: DC
  Administered 2021-07-10 – 2021-07-14 (×4): 40 mg via SUBCUTANEOUS
  Filled 2021-07-10 (×8): qty 0.4

## 2021-07-10 MED ORDER — CHLORHEXIDINE GLUCONATE 0.12 % MT SOLN
15.0000 mL | Freq: Two times a day (BID) | OROMUCOSAL | Status: DC
  Administered 2021-07-10: 15 mL via OROMUCOSAL
  Filled 2021-07-10: qty 15

## 2021-07-10 MED ORDER — POTASSIUM CHLORIDE 10 MEQ/100ML IV SOLN
10.0000 meq | INTRAVENOUS | Status: AC
  Administered 2021-07-10 (×2): 10 meq via INTRAVENOUS
  Filled 2021-07-10 (×2): qty 100

## 2021-07-10 MED ORDER — LACTATED RINGERS IV BOLUS: 20.0000 mL/kg | Freq: Once | INTRAVENOUS | Status: DC

## 2021-07-10 MED ORDER — SODIUM CHLORIDE 0.9% FLUSH: 10.0000 mL | INTRAVENOUS | Status: DC | PRN

## 2021-07-10 MED ORDER — ORAL CARE MOUTH RINSE: 15.0000 mL | Freq: Two times a day (BID) | OROMUCOSAL | Status: DC

## 2021-07-10 MED ORDER — METOPROLOL TARTRATE 5 MG/5ML IV SOLN
5.0000 mg | Freq: Once | INTRAVENOUS | Status: AC
  Administered 2021-07-10: 5 mg via INTRAVENOUS
  Filled 2021-07-10: qty 5

## 2021-07-10 MED ORDER — LIP MEDEX EX OINT
TOPICAL_OINTMENT | CUTANEOUS | Status: DC | PRN
  Administered 2021-07-10 – 2021-07-12 (×3): 75 via TOPICAL
  Filled 2021-07-10 (×2): qty 7

## 2021-07-10 MED ORDER — DILTIAZEM HCL 25 MG/5ML IV SOLN
5.0000 mg | Freq: Once | INTRAVENOUS | Status: DC
  Administered 2021-07-10: 5 mg via INTRAVENOUS
  Filled 2021-07-10: qty 5

## 2021-07-10 NOTE — ED Triage Notes (Signed)
Pt BIB EMS  from MD office, c/o nause/vomit since Friday. Per EMS pt has not eaten since Thursday. At the MD office CBG read "high". Pt vomit x5 today.  BP 144/106 P 132 CBG High

## 2021-07-10 NOTE — Progress Notes (Signed)
Peripherally Inserted Central Catheter Placement  The IV Nurse has discussed with the patient and/or persons authorized to consent for the patient, the purpose of this procedure and the potential benefits and risks involved with this procedure.  The benefits include less needle sticks, lab draws from the catheter, and the patient may be discharged home with the catheter. Risks include, but not limited to, infection, bleeding, blood clot (thrombus formation), and puncture of an artery; nerve damage and irregular heartbeat and possibility to perform a PICC exchange if needed/ordered by physician.  Alternatives to this procedure were also discussed.  Bard Power PICC patient education guide, fact sheet on infection prevention and patient information card has been provided to patient /or left at bedside.  PICC inserted by Claretha Cooper, RN  PICC Placement Documentation  PICC Double Lumen 32/20/25 Right Basilic 36 cm 1 cm (Active)  Indication for Insertion or Continuance of Line Poor Vasculature-patient has had multiple peripheral attempts or PIVs lasting less than 24 hours;Chronic illness with exacerbations (CF, Sickle Cell, etc.) 07/10/21 1524  Exposed Catheter (cm) 1 cm 07/10/21 1524  Site Assessment Clean;Dry;Intact 07/10/21 1524  Lumen #1 Status Flushed;Saline locked;Blood return noted 07/10/21 1524  Lumen #2 Status Flushed;Saline locked;Blood return noted 07/10/21 1524  Dressing Type Transparent;Securing device 07/10/21 1524  Dressing Status Antimicrobial disc in place 07/10/21 [place] Not Applicable 42/70/62 3762  Line Care Connections checked and tightened 07/10/21 1524  Line Adjustment (NICU/IV Team Only) No 07/10/21 1524  Dressing Intervention New dressing 07/10/21 1524  Dressing Change Due 07/17/21 07/10/21 [place], Nicolette Bang 07/10/2021, 3:24 PM

## 2021-07-10 NOTE — ED Provider Notes (Signed)
[facility] DEPT Provider Note   CSN: [id number] Arrival date & time: 07/10/21  1130     History  Chief Complaint  Patient presents with   Hyperglycemia    Lori [name] is a 51 y.o. female.  Level 5 caveat for acuity of condition broaden by EMS from PCPs office.  Reports intractable nausea and vomiting for the past 5 days not able to eat or drink anything.  Emesis has been coffee-ground's and brown in color.  Blood sugar was reading high at MDs office.  Patient does have history of diabetes and takes Lantus as well as sliding scale with meals.  Reports she has been taking her Lantus but not been able to eat or drink for the past 5 days and not use any sliding scale insulin. Unable to tolerate oral meds.  Denies diarrhea.  Denies fever.  Does complain of some upper abdominal pain.  No chest pain or shortness of breath.  No history of acid reflux or ulcers  The history is provided by the patient and the EMS personnel.  Hyperglycemia Associated symptoms: abdominal pain, fatigue, nausea, vomiting and weakness   Associated symptoms: no chest pain, no dizziness, no dysuria, no fever and no shortness of breath       Home Medications Prior to Admission medications   Medication Sig Start Date End Date Taking? Authorizing Provider  acetaminophen (TYLENOL) 325 MG tablet Take 2 tablets (650 mg total) by mouth every 6 (six) hours as needed for mild pain or headache. 02/26/21   Pokhrel, Corrie Mckusick, MD  amLODipine (NORVASC) 10 MG tablet Take 1 tablet (10 mg total) by mouth daily. For elevated BP- please call PCP for refills- in future 05/14/21   Lovorn, Jinny Blossom, MD  atorvastatin (LIPITOR) 80 MG tablet Take 80 mg by mouth daily. 06/25/21   [provider]  blood glucose meter kit and supplies KIT Dispense based on patient and insurance preference. Use up to four times daily as directed. 03/16/21   Love, Ivan Anchors, PA-C  butalbital-acetaminophen-caffeine (FIORICET)  50-325-40 MG tablet TAKE 1 TABLET BY MOUTH EVERY 6 HOURS AS NEEDED FOR MIGRANE 05/22/21   Ngetich, Dinah C, NP  clopidogrel (PLAVIX) 75 MG tablet TAKE 1 TABLET BY MOUTH ONCE DAILY 05/23/21   Ngetich, Dinah C, NP  Continuous Blood Gluc Receiver (FREESTYLE LIBRE 2 READER) DEVI Use to check blood sugar 3 times daily. 04/19/21   Charlott Rakes, MD  Continuous Blood Gluc Sensor (FREESTYLE LIBRE 2 SENSOR) MISC Use to check blood sugar 3 times daily. 04/19/21   Charlott Rakes, MD  diclofenac Sodium (VOLTAREN) 1 % GEL Apply 4 g topically 4 (four) times daily. 03/15/21   Love, Ivan Anchors, PA-C  empagliflozin (JARDIANCE) 10 MG TABS tablet Take 1 tablet (10 mg total) by mouth daily before breakfast. 06/25/21   Early Osmond, MD  escitalopram (LEXAPRO) 20 MG tablet TAKE 1 TABLET BY MOUTH EVERY MORNING 05/23/21   Ngetich, Dinah C, NP  ezetimibe (ZETIA) 10 MG tablet Take 1 tablet (10 mg total) by mouth daily. 05/23/21   Garvin Fila, MD  fluconazole (DIFLUCAN) 100 MG tablet Take one by mouth x 1 dose then repeat dose in one week 06/21/21   Ngetich, Dinah C, NP  gabapentin (NEURONTIN) 100 MG capsule TAKE 1 CAPSULE BY MOUTH THREE TIMES DAILY (BREAKFAST, LUNCH, BEDTIME) 05/23/21   Ngetich, Dinah C, NP  hydrOXYzine (ATARAX) 25 MG tablet Take 1 tablet (25 mg total) by mouth at bedtime. 06/21/21  Ngetich, Dinah C, NP  Insulin Aspart FlexPen (NOVOLOG) 100 UNIT/ML ADMINISTER 4 UNITS UNDER THE SKIN THREE TIMES DAILY WITH MEALS 07/09/21   Ngetich, Dinah C, NP  insulin glargine (LANTUS SOLOSTAR) 100 UNIT/ML Solostar Pen Inject 28 Units into the skin at bedtime. 07/05/21   Ngetich, Dinah C, NP  Insulin Pen Needle 32G X 4 MM MISC Use to inject insulin 4 times daily as directed. 03/15/21   Love, Ivan Anchors, PA-C  lip balm (CARMEX) ointment Apply topically as needed for lip care. 03/16/21   Love, Ivan Anchors, PA-C  lisinopril (ZESTRIL) 20 MG tablet Take 20 mg by mouth daily. 06/25/21   [provider]  lisinopril (ZESTRIL) 40 MG tablet  Take 1 tablet (40 mg total) by mouth daily. 06/01/21   Ngetich, Dinah C, NP  loratadine (CLARITIN) 10 MG tablet Take 1 tablet (10 mg total) by mouth daily. 03/15/21   Love, Ivan Anchors, PA-C  LORazepam (ATIVAN) 0.5 MG tablet TAKE 1 TABLET BY MOUTH AT BEDTIME 05/23/21   Ngetich, Dinah C, NP  melatonin 3 MG TABS tablet Take 3 tablets (9 mg total) by mouth at bedtime. 04/20/21   Ngetich, Dinah C, NP  methimazole (TAPAZOLE) 5 MG tablet TAKE 1 TABLET BY MOUTH EVERY MORNING 05/23/21   Ngetich, Dinah C, NP  metoCLOPramide (REGLAN) 10 MG tablet Take 1 tablet (10 mg total) by mouth 3 (three) times daily before meals. 06/21/21   Ngetich, Dinah C, NP  metoprolol tartrate (LOPRESSOR) 100 MG tablet TAKE 1 TABLET BY MOUTH TWICE DAILY (BREAKFAST, BEDTIME) 05/23/21   Ngetich, Dinah C, NP  nystatin (MYCOSTATIN/NYSTOP) powder USE 1 APPLICATION TWICE DAILY 05/23/21   Ngetich, Dinah C, NP  ondansetron (ZOFRAN) 4 MG tablet Take 1 tablet (4 mg total) by mouth every 6 (six) hours. 06/11/21   Drenda Freeze, MD  pantoprazole (PROTONIX) 40 MG tablet TAKE 1 TABLET BY MOUTH EVERY MORNING 05/23/21   Ngetich, Dinah C, NP  polyethylene glycol powder (GLYCOLAX/MIRALAX) 17 GM/SCOOP powder Take 17 g by mouth 2 (two) times daily. 03/15/21   Love, Ivan Anchors, PA-C  rosuvastatin (CRESTOR) 40 MG tablet Take 1 tablet (40 mg total) by mouth daily. 06/25/21   Early Osmond, MD  senna (SENOKOT) 8.6 MG tablet Take 1 tablet by mouth as needed for constipation.    [provider]  tamsulosin (FLOMAX) 0.4 MG CAPS capsule TAKE 1 CAPSULE BY MOUTH AT BEDTIME 05/23/21   Ngetich, Dinah C, NP  topiramate (TOPAMAX) 25 MG tablet TAKE 1 TABLET(25 MG) BY MOUTH TWICE DAILY 06/22/21   Ngetich, Dinah C, NP      Allergies    Sumatriptan, Contrast media [iodinated contrast media], and Penicillins    Review of Systems   Review of Systems  Constitutional:  Positive for activity change, appetite change and fatigue. Negative for fever.  HENT:  Negative for  congestion and rhinorrhea.   Respiratory:  Negative for cough, chest tightness and shortness of breath.   Cardiovascular:  Negative for chest pain.  Gastrointestinal:  Positive for abdominal pain, nausea and vomiting.  Genitourinary:  Negative for dysuria and hematuria.  Musculoskeletal:  Negative for arthralgias and myalgias.  Skin:  Negative for rash.  Neurological:  Positive for weakness. Negative for dizziness and headaches.   all other systems are negative except as noted in the HPI and PMH.   Physical Exam Updated Vital Signs BP 112/86   Pulse (!) 126   Temp 98.3 F (36.8 C) (Oral)   Resp (!) 27  SpO2 95%  Physical Exam Vitals and nursing note reviewed.  Constitutional:      General: She is not in acute distress.    Appearance: She is well-developed. She is ill-appearing.     Comments: Very dry mucous membranes, brown discoloration about oropharynx  HENT:     Head: Normocephalic and atraumatic.     Mouth/Throat:     Pharynx: No oropharyngeal exudate.  Eyes:     Conjunctiva/sclera: Conjunctivae normal.     Pupils: Pupils are equal, round, and reactive to light.  Neck:     Comments: No meningismus. Cardiovascular:     Rate and Rhythm: Regular rhythm. Tachycardia present.     Heart sounds: Normal heart sounds. No murmur heard. Pulmonary:     Effort: Pulmonary effort is normal. No respiratory distress.     Breath sounds: Normal breath sounds.  Abdominal:     Palpations: Abdomen is soft.     Tenderness: There is abdominal tenderness. There is no guarding or rebound.     Comments: Epigastric tenderness  Musculoskeletal:        General: No tenderness. Normal range of motion.     Cervical back: Normal range of motion and neck supple.  Skin:    General: Skin is warm.  Neurological:     Mental Status: She is alert and oriented to person, place, and time.     Cranial Nerves: No cranial nerve deficit.     Motor: No abnormal muscle tone.     Coordination: Coordination  normal.     Comments: Slurred speech, worse than usual by report. L sided weakness at baseline. 5/5 strength on R.  Psychiatric:        Behavior: Behavior normal.    ED Results / Procedures / Treatments   Labs (all labs ordered are listed, but only abnormal results are displayed) Labs Reviewed  CBC WITH DIFFERENTIAL/PLATELET - Abnormal; Notable for the following components:      Result Value   WBC 22.8 (*)    RBC 5.56 (*)    MCV 77.3 (*)    RDW 15.8 (*)    Neutro Abs 19.9 (*)    Abs Immature Granulocytes 0.16 (*)    All other components within normal limits  CBG MONITORING, ED - Abnormal; Notable for the following components:   Glucose-Capillary >600 (*)    All other components within normal limits  I-STAT CHEM 8, ED - Abnormal; Notable for the following components:   Sodium 126 (*)    Chloride 86 (*)    Glucose, Bld 694 (*)    Calcium, Ion 1.05 (*)    Hemoglobin 16.7 (*)    HCT 49.0 (*)    All other components within normal limits  BASIC METABOLIC PANEL  BASIC METABOLIC PANEL  BASIC METABOLIC PANEL  BASIC METABOLIC PANEL  BETA-HYDROXYBUTYRIC ACID  BETA-HYDROXYBUTYRIC ACID  URINALYSIS, ROUTINE W REFLEX MICROSCOPIC  BLOOD GAS, VENOUS  OCCULT BLOOD GASTRIC / DUODENUM (SPECIMEN CUP)  HEPATIC FUNCTION PANEL  LIPASE, BLOOD  I-STAT BETA HCG BLOOD, ED ([facility], [facility], [facility] ONLY)  CBG MONITORING, ED  TROPONIN I (HIGH SENSITIVITY)    EKG EKG Interpretation  Date/Time:  Tuesday July 10 2021 11:48:24 EDT Ventricular Rate:  129 PR Interval:    QRS Duration: 79 QT Interval:  290 QTC Calculation: 425 R Axis:   85 Text Interpretation: Sinus tachycardia Borderline repolarization abnormality No significant change was found Confirmed by Ezequiel Essex [phone]) on 07/10/2021 12:08:30 PM  Radiology CT ABDOMEN PELVIS WO  CONTRAST  Result Date: 07/10/2021 CLINICAL DATA:  Nausea and vomiting since Friday. EXAM: CT ABDOMEN AND PELVIS WITHOUT CONTRAST TECHNIQUE: Multidetector CT imaging of the  abdomen and pelvis was performed following the standard protocol without IV contrast. RADIATION DOSE REDUCTION: This exam was performed according to the departmental dose-optimization program which includes automated exposure control, adjustment of the mA and/or kV according to patient size and/or use of iterative reconstruction technique. COMPARISON:  Jun 11, 2021 CT abdomen pelvis FINDINGS: Lower chest: Bibasilar atelectasis versus scarring. Prior median sternotomy and CABG. Hepatobiliary: Unremarkable noncontrast appearance of the hepatic parenchyma. Gallbladder is unremarkable. No biliary ductal dilation. Pancreas: Mild stranding along the pancreatic head may reflect pancreatitis recommend correlation with serum lipase. No pancreatic ductal dilation. Spleen: No splenomegaly. 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 Mild thickening of the left adrenal gland without discrete nodularity is similar prior. No hydronephrosis. No renal, ureteral or bladder calculi. Urinary bladder is unremarkable for degree of distension. Stomach/Bowel: No radiopaque enteric contrast material was administered. Small hiatal hernia otherwise the stomach is unremarkable for degree of distension. No pathologic dilation of small or large bowel. Terminal ileum appears normal. The appendix is not confidently identified however there is no pericecal inflammation. Evidence of acute bowel inflammation. Vascular/Lymphatic: Extensive aortic and branch vessel atherosclerosis without abdominal aortic aneurysm. No pathologically enlarged abdominal or pelvic lymph nodes. Reproductive: Status post hysterectomy. No adnexal masses. Other: No significant abdominopelvic free fluid. No pneumoperitoneum. Similar size of the heterogeneous soft tissue nodularity in the anterior abdominal wall which measures 3.3 x 2.0 cm on image 84/4 previously 3.4 x 2.2 cm, and appears to be at the level of patient's known C-section scar.  Musculoskeletal: No acute osseous abnormality. IMPRESSION: 1. No acute abnormality in the abdomen or pelvis on this noncontrast CT, specifically no evidence of bowel obstruction or acute bowel inflammation. 2. No significant interval change 3.3 cm heterogeneous soft tissue density within the deep subcutaneous soft tissues of the lower anterior abdominal wall potentially related to prior surgery. 3. Prior right adrenalectomy. Electronically Signed   By: Dahlia Bailiff M.D.   On: 07/10/2021 14:35   CT Head Wo Contrast  Result Date: 07/10/2021 CLINICAL DATA:  Altered mental status. Mental status change of unknown cause. Headache and vomiting. Chronic left-sided weakness. EXAM: CT HEAD WITHOUT CONTRAST TECHNIQUE: Contiguous axial images were obtained from the base of the skull through the vertex without intravenous contrast. RADIATION DOSE REDUCTION: This exam was performed according to the departmental dose-optimization program which includes automated exposure control, adjustment of the mA and/or kV according to patient size and/or use of iterative reconstruction technique. COMPARISON:  Head CT and brain MRI 06/11/2021 FINDINGS: Brain: No acute intracranial hemorrhage. There is no evidence of acute ischemia. Remote lacunar infarcts within bilateral basal ganglia and remote left cerebellar infarct, unchanged. No hydrocephalus. Periventricular and deep white matter hypodensity typical of chronic small vessel ischemia, stable. Vascular: Atherosclerosis of skullbase vasculature without hyperdense vessel or abnormal calcification. Skull: No fracture or focal lesion. Sinuses/Orbits: Paranasal sinuses and mastoid air cells are clear. The visualized orbits are unremarkable. Other: None. IMPRESSION: 1. No acute intracranial abnormality. 2. Remote left cerebellar infarct, chronic basal gangliar lacunar infarcts and age advanced chronic small vessel ischemia. Electronically Signed   By: Keith Rake M.D.   On: 07/10/2021  16:18   DG Chest Portable 1 View  Result Date: 07/10/2021 CLINICAL DATA:  Altered mental status, vomiting. EXAM: PORTABLE CHEST 1 VIEW COMPARISON:  Jun 11, 2021 FINDINGS: Postsurgical  changes from CABG. Cardiomediastinal silhouette is normal. Mediastinal contours appear intact. Streaky perihilar and peribronchial opacities in bilateral lower lobes. Low lung volumes. Osseous structures are without acute abnormality. Soft tissues are grossly normal. IMPRESSION: Streaky perihilar and peribronchial opacities in bilateral lower lobes may represent atelectasis or peribronchial airspace consolidation. Electronically Signed   By: Fidela Salisbury M.D.   On: 07/10/2021 12:42   DG Abd Portable 1 View  Result Date: 07/10/2021 CLINICAL DATA:  Nausea and vomiting EXAM: PORTABLE ABDOMEN - 1 VIEW COMPARISON:  02/14/2021 FINDINGS: The bowel gas pattern is normal. No radio-opaque calculi or other significant radiographic abnormality are seen. Age advanced aortoiliac atherosclerotic calcification. IMPRESSION: 1. Nonobstructive bowel gas pattern. 2. Age advanced aortoiliac atherosclerotic calcification. Electronically Signed   By: Davina Poke D.O.   On: 07/10/2021 12:43   [place] EKG SITE RITE  Result Date: 07/10/2021 If Site Rite image not attached, placement could not be confirmed due to current cardiac rhythm.   Procedures .Critical Care Performed by: Ezequiel Essex, MD Authorized by: Ezequiel Essex, MD   Critical care provider statement:    Critical care time (minutes):  45   Critical care time was exclusive of:  Separately billable procedures and treating other patients   Critical care was necessary to treat or prevent imminent or life-threatening deterioration of the following conditions:  Dehydration, endocrine crisis and metabolic crisis   Critical care was time spent personally by me on the following activities:  Development of treatment plan with patient or surrogate, discussions with consultants,  evaluation of patient's response to treatment, examination of patient, ordering and review of laboratory studies, ordering and review of radiographic studies, ordering and performing treatments and interventions, pulse oximetry, re-evaluation of patient's condition and review of old charts   I assumed direction of critical care for this patient from another provider in my specialty: yes     Care discussed with: admitting provider      Medications Ordered in ED Medications  lactated ringers bolus 1,850 mL (has no administration in time range)  ondansetron (ZOFRAN) injection 4 mg (has no administration in time range)    ED Course/ Medical Decision Making/ A&P                           Medical Decision Making Amount and/or Complexity of Data Reviewed Independent Historian: EMS Labs: ordered. Decision-making details documented in ED Course. Radiology: ordered and independent interpretation performed. Decision-making details documented in ED Course. ECG/medicine tests: ordered and independent interpretation performed. Decision-making details documented in ED Course.  Risk Prescription drug management. Decision regarding hospitalization.  5 days of nausea and vomiting with hyperglycemia and concern for DKA.  Patient appears ill is tachycardic, tachypneic but not febrile.  Abdomen soft without peritoneal signs.  She does have some brown emesis with concern for possible GI bleed.  Denies any black or bloody stools.  She had IV fluids and likely IV insulin after labs are obtained.  Antiemetics to be given.  Blood sugar 694.  Bicarb is 25.  Continue IV fluids and antiemetics.  We will replace potassium and give IV insulin  Patient continues to have coffee-ground emesis with nausea and vomiting.  Leukocytosis is noted.  Will obtain abdominal CT scan given her persistent vomiting and leukocytosis.  Has a normal anion gap and does not appear to be in DKA.  Patient persistently tachycardic in  the 120s and 130s appears to be sinus.  This is not improved  with IV fluids.  She has not had her beta-blocker for several days only given a dose of IV Lopressor.  We will also check thyroid studies.  CT scan does not show any acute pathology.  Results reviewed and interpreted by me Questionable areas of pancreatitis, lipase 111.  Remains tachycardic with intractable nausea and vomiting.  Gastroccult pending for further evaluation of her emesis. Continue IVF and insulin gtt.  Admission discussed with Dr. Eliseo Squires.  Angiocath insertion Performed by: Ezequiel Essex  Consent: Verbal consent obtained. Risks and benefits: risks, benefits and alternatives were discussed Time out: Immediately prior to procedure a "time out" was called to verify the correct patient, procedure, equipment, support staff and site/side marked as required.  Preparation: Patient was prepped and draped in the usual sterile fashion.  Vein Location: R IJ  Ultrasound Guided  Gauge: 18  Normal blood return and flush without difficulty Patient tolerance: Patient tolerated the procedure well with no immediate complications.         Final Clinical Impression(s) / ED Diagnoses Final diagnoses:  None    Rx / DC Orders ED Discharge Orders     None         Willey Due, Annie Main, MD 07/10/21 1859

## 2021-07-10 NOTE — Progress Notes (Signed)
At bedside. Assessed extensively for a suitable vessel to cannulate with no success. Very poor vasculature. MD came to bedside and placed an EJ. Received orders for PICC placement. RN aware.

## 2021-07-10 NOTE — Progress Notes (Signed)
Provider: Marlowe Sax FNP-C  Ameyah Bangura, Nelda Bucks, NP  Patient Care Team: Evalette Montrose, Nelda Bucks, NP as PCP - General (Family Medicine) Early Osmond, MD as PCP - Cardiology (Cardiology)  Extended Emergency Contact Information Primary Emergency Contact: Nenana Phone: [phone] Mobile Phone: [phone] Relation: Daughter Secondary Emergency Contact: [name],[name] Mobile Phone: [phone] Relation: Spouse Preferred language: English Interpreter needed? No  Code Status:  Full Code  Goals of care: Advanced Directive information    07/10/2021   10:29 AM  Advanced Directives  Does Patient Have a Medical Advance Directive? No  Would patient like information on creating a medical advance directive? No - Patient declined     Chief Complaint  Patient presents with   Acute Visit    Patient is here for severe nausea, vomiting, and vertigo. Has not eaten since Thursday and throws up all liquids. Says slurred speech is also getting worse and she is very fatigued. Patient also fell on Sunday    HPI:  Pt is a 51 y.o. female seen today for an acute visit for evaluation of high blood pressure and high blood sugars.  [name] with [facility] called on 07/09/2021 stated that patient's blood sugar was 455 but she did not take her Lantus the previous night.  Also stated patient's blood pressure was 198/129 stated that patient had taken her medication but threw it back up yesterday morning.Nurse reported that patient declined to go to the hospital.  She is here with husband who states patient has not been able to eat since last week on Thursday.Any time she drinks or eat she vomits even drinking water. Her speech has been more slurred than usual.   She denies any fever,chills,cough,fatigue,body aches,runny nose,chest tightness,chest pain,palpitation or shortness of breath.Also denies any abdominal pain. Emesis has been red-brown in color.    Past Medical History:   Diagnosis Date   CAD (coronary artery disease)    Depression    Diabetes mellitus without complication (Post)    History of CT scan    History of mammogram    History of MRI    Hypertension    Pheochromocytoma    Pheochromocytoma    Stroke ([place])    Thyroid disease    Past Surgical History:  Procedure Laterality Date   ABDOMINAL HYSTERECTOMY     CESAREAN SECTION     3   CORONARY ARTERY BYPASS GRAFT     Right adrenal gland removal for pheochromocytoma Right     Allergies  Allergen Reactions   Sumatriptan Anaphylaxis   Contrast Media [Iodinated Contrast Media] Swelling   Penicillins Swelling    Mouth swells up and eyes swollen shut    Outpatient Encounter Medications as of 07/10/2021  Medication Sig   acetaminophen (TYLENOL) 325 MG tablet Take 2 tablets (650 mg total) by mouth every 6 (six) hours as needed for mild pain or headache.   amLODipine (NORVASC) 10 MG tablet Take 1 tablet (10 mg total) by mouth daily. For elevated BP- please call PCP for refills- in future   amLODipine (NORVASC) 5 MG tablet Take 5 mg by mouth daily.   atorvastatin (LIPITOR) 80 MG tablet Take 80 mg by mouth daily.   blood glucose meter kit and supplies KIT Dispense based on patient and insurance preference. Use up to four times daily as directed.   butalbital-acetaminophen-caffeine (FIORICET) 50-325-40 MG tablet TAKE 1 TABLET BY MOUTH EVERY 6 HOURS AS NEEDED FOR MIGRANE   clopidogrel (PLAVIX) 75 MG tablet TAKE 1 TABLET  BY MOUTH ONCE DAILY   Continuous Blood Gluc Receiver (FREESTYLE LIBRE 2 READER) DEVI Use to check blood sugar 3 times daily.   Continuous Blood Gluc Sensor (FREESTYLE LIBRE 2 SENSOR) MISC Use to check blood sugar 3 times daily.   diclofenac Sodium (VOLTAREN) 1 % GEL Apply 4 g topically 4 (four) times daily.   empagliflozin (JARDIANCE) 10 MG TABS tablet Take 1 tablet (10 mg total) by mouth daily before breakfast.   escitalopram (LEXAPRO) 20 MG tablet TAKE 1 TABLET BY MOUTH EVERY MORNING    ezetimibe (ZETIA) 10 MG tablet Take 1 tablet (10 mg total) by mouth daily.   fluconazole (DIFLUCAN) 100 MG tablet Take one by mouth x 1 dose then repeat dose in one week   gabapentin (NEURONTIN) 100 MG capsule TAKE 1 CAPSULE BY MOUTH THREE TIMES DAILY (BREAKFAST, LUNCH, BEDTIME)   hydrOXYzine (ATARAX) 25 MG tablet Take 1 tablet (25 mg total) by mouth at bedtime.   Insulin Aspart FlexPen (NOVOLOG) 100 UNIT/ML ADMINISTER 4 UNITS UNDER THE SKIN THREE TIMES DAILY WITH MEALS   insulin glargine (LANTUS SOLOSTAR) 100 UNIT/ML Solostar Pen Inject 28 Units into the skin at bedtime.   Insulin Pen Needle 32G X 4 MM MISC Use to inject insulin 4 times daily as directed.   lip balm (CARMEX) ointment Apply topically as needed for lip care.   lisinopril (ZESTRIL) 20 MG tablet Take 20 mg by mouth daily.   lisinopril (ZESTRIL) 40 MG tablet Take 1 tablet (40 mg total) by mouth daily.   loratadine (CLARITIN) 10 MG tablet Take 1 tablet (10 mg total) by mouth daily.   LORazepam (ATIVAN) 0.5 MG tablet TAKE 1 TABLET BY MOUTH AT BEDTIME   melatonin 3 MG TABS tablet Take 3 tablets (9 mg total) by mouth at bedtime.   methimazole (TAPAZOLE) 5 MG tablet TAKE 1 TABLET BY MOUTH EVERY MORNING   metoCLOPramide (REGLAN) 10 MG tablet Take 1 tablet (10 mg total) by mouth 3 (three) times daily before meals.   metoprolol tartrate (LOPRESSOR) 100 MG tablet TAKE 1 TABLET BY MOUTH TWICE DAILY (BREAKFAST, BEDTIME)   nystatin (MYCOSTATIN/NYSTOP) powder USE 1 APPLICATION TWICE DAILY   ondansetron (ZOFRAN) 4 MG tablet Take 1 tablet (4 mg total) by mouth every 6 (six) hours.   pantoprazole (PROTONIX) 40 MG tablet TAKE 1 TABLET BY MOUTH EVERY MORNING   polyethylene glycol powder (GLYCOLAX/MIRALAX) 17 GM/SCOOP powder Take 17 g by mouth 2 (two) times daily.   rosuvastatin (CRESTOR) 40 MG tablet Take 1 tablet (40 mg total) by mouth daily.   senna (SENOKOT) 8.6 MG tablet Take 1 tablet by mouth as needed for constipation.   tamsulosin (FLOMAX)  0.4 MG CAPS capsule TAKE 1 CAPSULE BY MOUTH AT BEDTIME   topiramate (TOPAMAX) 25 MG tablet TAKE 1 TABLET(25 MG) BY MOUTH TWICE DAILY   No facility-administered encounter medications on file as of 07/10/2021.    Review of Systems  Constitutional:  Negative for appetite change, chills, fatigue, fever and unexpected weight change.  HENT:  Negative for congestion, dental problem, ear discharge, ear pain, facial swelling, hearing loss, nosebleeds, postnasal drip, rhinorrhea, sinus pressure, sinus pain, sneezing, sore throat, tinnitus and trouble swallowing.   Eyes:  Negative for pain, discharge, redness, itching and visual disturbance.  Respiratory:  Negative for cough, chest tightness, shortness of breath and wheezing.   Cardiovascular:  Negative for chest pain, palpitations and leg swelling.  Gastrointestinal:  Positive for nausea and vomiting. Negative for abdominal distention, abdominal pain, blood in  stool, constipation and diarrhea.  Endocrine: Negative for cold intolerance, heat intolerance, polydipsia, polyphagia and polyuria.  Genitourinary:  Negative for difficulty urinating, dysuria, flank pain and urgency.       Incontinent   Musculoskeletal:  Positive for gait problem. Negative for arthralgias, back pain, joint swelling, myalgias, neck pain and neck stiffness.  Skin:  Negative for color change, pallor, rash and wound.  Neurological:  Positive for speech difficulty and weakness. Negative for dizziness, syncope, light-headedness, numbness and headaches.  Hematological:  Does not bruise/bleed easily.  Psychiatric/Behavioral:  Negative for agitation, behavioral problems, confusion, hallucinations, self-injury, sleep disturbance and suicidal ideas. The patient is not nervous/anxious.     There is no immunization history on file for this patient. Pertinent  Health Maintenance Due  Topic Date Due   FOOT EXAM  Never done   OPHTHALMOLOGY EXAM  Never done   COLONOSCOPY (Pts 45-54yr Insurance  coverage will need to be confirmed)  Never done   INFLUENZA VACCINE  09/04/2021   HEMOGLOBIN A1C  10/25/2021   PAP SMEAR-Modifier  Discontinued      05/17/2021    1:54 PM 06/11/2021   11:40 AM 06/21/2021    2:39 PM 07/03/2021   12:23 PM 07/10/2021   10:33 AM  Fall Risk  Falls in the past year? 1  0 1 1  Was there an injury with Fall? 0  0 0 0  Fall Risk Category Calculator 2  0 2 2  Fall Risk Category Moderate  Low Moderate Moderate  Patient Fall Risk Level Moderate fall risk Low fall risk Low fall risk  Moderate fall risk  Patient at Risk for Falls Due to History of fall(s)  No Fall Risks Impaired balance/gait;Impaired mobility;History of fall(s) History of fall(s);Impaired balance/gait;Impaired mobility  Fall risk Follow up Falls evaluation completed;Education provided;Falls prevention discussed  Falls evaluation completed  Falls evaluation completed   Functional Status Survey:    Vitals:   07/10/21 1031  BP: 130/68  Pulse: (!) 138  Resp: 20  Temp: (!) 97.3 F (36.3 C)  TempSrc: Temporal  SpO2: 95%  Weight: 204 lb (92.5 kg)  Height: 5' 2"  (1.575 m)   Body mass index is 37.31 kg/m. Physical Exam Vitals reviewed.  Constitutional:      General: She is not in acute distress.    Appearance: Normal appearance. She is normal weight. She is ill-appearing. She is not diaphoretic.  HENT:     Head: Normocephalic.     Mouth/Throat:     Mouth: Mucous membranes are dry.     Pharynx: Oropharynx is clear. No oropharyngeal exudate or posterior oropharyngeal erythema.     Comments: Red-brownish emesis noted on mouth   Eyes:     General: No scleral icterus.       Right eye: No discharge.        Left eye: No discharge.     Conjunctiva/sclera: Conjunctivae normal.     Pupils: Pupils are equal, round, and reactive to light.  Neck:     Vascular: No carotid bruit.  Cardiovascular:     Rate and Rhythm: Normal rate and regular rhythm.     Pulses: Normal pulses.     Heart sounds: Normal  heart sounds. No murmur heard.   No friction rub. No gallop.  Pulmonary:     Effort: Pulmonary effort is normal. No respiratory distress.     Breath sounds: Examination of the right-middle field reveals decreased breath sounds. Examination of the left-middle field reveals decreased breath  sounds. Examination of the right-lower field reveals decreased breath sounds. Examination of the left-lower field reveals decreased breath sounds. Decreased breath sounds present. No wheezing or rhonchi.  Chest:     Chest wall: No tenderness.  Abdominal:     General: Bowel sounds are normal. There is no distension.     Palpations: Abdomen is soft. There is no mass.     Tenderness: There is no abdominal tenderness. There is no right CVA tenderness, left CVA tenderness, guarding or rebound.  Musculoskeletal:        General: No swelling or tenderness. Normal range of motion.     Cervical back: Normal range of motion. No rigidity or tenderness.     Right lower leg: No edema.     Left lower leg: No edema.     Comments: Wheelchair dependant   Lymphadenopathy:     Cervical: No cervical adenopathy.  Skin:    General: Skin is warm and dry.     Coloration: Skin is not pale.     Findings: No bruising, erythema, lesion or rash.  Neurological:     Mental Status: She is alert and oriented to person, place, and time.     Cranial Nerves: No cranial nerve deficit.     Sensory: No sensory deficit.     Motor: Weakness present.     Coordination: Coordination normal.     Gait: Gait abnormal.     Comments: Left side hemiparesis   Psychiatric:        Mood and Affect: Mood normal.        Speech: Speech normal.        Behavior: Behavior normal.    Labs reviewed: Recent Labs    02/21/21 0407 02/22/21 0306 02/26/21 0632 02/27/21 0612 04/24/21 1037 04/30/21 1808 06/11/21 1500 06/21/21 1522  NA 134*   < > 131*   < > 142 136 138  --   K 3.5   < > 4.4   < > 4.1 4.1 4.1  --   CL 99   < > 100   < > 104 103 102  --    CO2 27   < > 24   < > 18* 23 25  --   GLUCOSE 215*   < > 247*   < > 241* 264* 254*  --   BUN 7   < > 18   < > 7 7 12   --   CREATININE 0.59   < > 0.73   < > 0.64 0.68 0.70  --   CALCIUM 8.6*   < > 8.8*   < > 8.8 8.7* 9.6  --   MG 1.7   < > 1.8  --   --   --  1.6* 1.6  PHOS 4.3  --   --   --   --   --   --   --    < > = values in this interval not displayed.   Recent Labs    04/08/21 1540 04/24/21 1037 04/30/21 1808 06/11/21 1500  AST 18 12 17 17   ALT 18 11 16 19   ALKPHOS 96  --  92 124  BILITOT 0.2* 0.2 0.5 0.8  PROT 7.3 7.0 6.8 8.5*  ALBUMIN 3.5  --  3.2* 4.1   Recent Labs    04/24/21 1037 04/30/21 1808 06/11/21 1500 06/21/21 1522  WBC 5.3 6.3 11.7* 7.2  NEUTROABS 3,318 3.5  --  4,140  HGB 13.5 13.5 15.7* 14.4  HCT 42.0 40.6 45.6 44.8  MCV 83.5 79.9* 76.6* 80.6  PLT 368 390 405* 394   Lab Results  Component Value Date   TSH 1.072 06/11/2021   Lab Results  Component Value Date   HGBA1C 8.4 (H) 04/24/2021   Lab Results  Component Value Date   CHOL 183 04/24/2021   HDL 49 (L) 04/24/2021   LDLCALC 108 (H) 04/24/2021   TRIG 143 04/24/2021   CHOLHDL 3.7 04/24/2021    Significant Diagnostic Results in last 30 days:  CT ABDOMEN PELVIS WO CONTRAST  Result Date: 06/11/2021 CLINICAL DATA:  Nausea vomiting EXAM: CT ABDOMEN AND PELVIS WITHOUT CONTRAST TECHNIQUE: Multidetector CT imaging of the abdomen and pelvis was performed following the standard protocol without IV contrast. RADIATION DOSE REDUCTION: This exam was performed according to the departmental dose-optimization program which includes automated exposure control, adjustment of the mA and/or kV according to patient size and/or use of iterative reconstruction technique. COMPARISON:  Radiograph 02/14/2021, CT 01/19/2021 FINDINGS: Lower chest: Lung bases demonstrate linear scarring or atelectasis. No acute consolidation or effusion. Coronary vascular calcifications. Small moderate hiatal hernia. Hepatobiliary: No  focal liver abnormality is seen. No gallstones, gallbladder wall thickening, or biliary dilatation. Pancreas: Unremarkable. No pancreatic ductal dilatation or surrounding inflammatory changes. Spleen: Normal in size without focal abnormality. Adrenals/Urinary Tract: Right adrenal gland is nonvisualized corresponding to history of prior removal. Slightly thickened left adrenal gland without mass. No hydronephrosis. The bladder is unremarkable Stomach/Bowel: The stomach is nonenlarged. No dilated small bowel. No acute bowel wall thickening. Vascular/Lymphatic: Advanced aortic atherosclerosis. No aneurysm. No suspicious lymph nodes Reproductive: Status post hysterectomy. No adnexal masses. Other: Negative for pelvic effusion or free air. Heterogeneous soft tissue thickening along the low anterior abdominal wall measuring 3.4 by 2.2 cm, without significant change, appears to be at the level of patient's Cesarian section scar. Musculoskeletal: No acute or significant osseous findings. IMPRESSION: 1. No CT evidence for acute intra-abdominal or pelvic abnormality. 2. Nonvisualized right adrenal gland corresponding to history of surgical removal 3. 3.4 cm heterogenous soft tissue density within the deep subcutaneous soft tissues of the lower anterior abdominal wall, potentially related to prominent scarring from prior surgery (appears to be near low ventral scar), this may be correlated with physical exam Electronically Signed   By: Donavan Foil M.D.   On: 06/11/2021 17:13   DG Chest 2 View  Result Date: 06/11/2021 CLINICAL DATA:  vomiting and chest pain EXAM: CHEST - 2 VIEW COMPARISON:  Chest x-ray February 13, 23. FINDINGS: Similar cardiomediastinal silhouette. CABG and median sternotomy. No visible pleural effusions or pneumothorax. No consolidation. Right upper lobe azygous fissure, anatomic variant. No displaced fracture. IMPRESSION: No evidence of acute cardiopulmonary disease. Electronically Signed   By:  Margaretha Sheffield M.D.   On: 06/11/2021 12:15   CT Head Wo Contrast  Result Date: 06/11/2021 CLINICAL DATA:  Neuro deficit, acute, stroke suspected EXAM: CT HEAD WITHOUT CONTRAST TECHNIQUE: Contiguous axial images were obtained from the base of the skull through the vertex without intravenous contrast. RADIATION DOSE REDUCTION: This exam was performed according to the departmental dose-optimization program which includes automated exposure control, adjustment of the mA and/or kV according to patient size and/or use of iterative reconstruction technique. COMPARISON:  CT head 02/19/2021. FINDINGS: Brain: No evidence of acute large vascular territory infarction, hemorrhage, hydrocephalus, extra-axial collection or mass lesion/mass effect. Similar remote left cerebellar infarct. Similar remote infarcts in the left frontal lobe, bilateral basal ganglia, and left thalamus. Additional age-advanced white matter  hypodensities are nonspecific but compatible with chronic microvascular ischemic disease. Vascular: No hyperdense vessel identified. Calcific intracranial atherosclerosis. Skull: No acute fracture. Sinuses/Orbits: Clear sinuses.  No acute orbital findings. Other: No mastoid effusions. IMPRESSION: 1. No evidence of acute intracranial abnormality. 2. Multiple remote infarcts and age-advanced chronic microvascular ischemic disease. Electronically Signed   By: Margaretha Sheffield M.D.   On: 06/11/2021 13:47   MR BRAIN WO CONTRAST  Result Date: 06/11/2021 CLINICAL DATA:  Neuro deficit, acute, stroke suspected EXAM: MRI HEAD WITHOUT CONTRAST TECHNIQUE: Multiplanar, multiecho pulse sequences of the brain and surrounding structures were obtained without intravenous contrast. COMPARISON:  CT head from the same day. FINDINGS: Brain: No acute infarction, hemorrhage, hydrocephalus, extra-axial collection or mass lesion. Remote infarcts in the left frontal lobe, cerebellum, bilateral basal ganglia and left thalamus. Wallerian  degeneration of the right midbrain. Additional moderate patchy T2/FLAIR hyperintensities in the white matter, nonspecific compatible with age-advanced chronic microvascular ischemic disease. Small foci of susceptibility artifact within left thalamus and right basal ganglia, and pons, compatible with chronic microhemorrhages. Vascular: Major arterial flow voids are maintained at the skull base. Skull and upper cervical spine: Normal marrow signal. Sinuses/Orbits: Clear sinuses.  No acute orbital findings Other: No mastoid effusions. IMPRESSION: 1. No evidence of acute intracranial abnormality. 2. Multiple remote infarcts and age-advanced chronic microvascular disease. 3. Chronic microhemorrhages, potentially hypertensive in etiology. Electronically Signed   By: Margaretha Sheffield M.D.   On: 06/11/2021 14:48    Assessment/Plan 1. Nausea and vomiting, unspecified vomiting type Ongoing x 6 days unable to tolerate any food or fluid. Red-brownish emesis noted on mouth  on exam  - Send to ED for evaluation   2. Type 2 diabetes mellitus with hyperlipidemia (HCC) CBG  > 600  Suspect possible Ketoacidosis   3. Generalized weakness Suspect possible dehydration for poor oral intake  - send to ED for evaluation   4. History of CVA (cerebrovascular accident) Left sided weakness  - Has more slurred speech than usual.  Family/ staff Communication: Reviewed plan of care with patient and husband verbalized understanding.send to ED for evaluation of possible Ketoacidosis,Dehydration and slurred speech.   Labs/tests ordered: None   Next Appointment: Has appointment in place  [name], NP

## 2021-07-10 NOTE — H&P (Addendum)
History and Physical    Patient: Lori [name] INO:[id number] DOB: Jul 02, 1970 DOA: 07/10/2021 DOS: the patient was seen and examined on 07/10/2021 PCP: [name], NP  Patient coming from: PCP 's office  Chief Complaint:  Chief Complaint  Patient presents with   Hyperglycemia   HPI: Lori [name] is a 51 y.o. female with medical history significant of DM, h.o CVA, CAD and depression who was sent to the ER from her PCP's office with severe N/V and dizziness.  She has been vomiting since Thursday.  She fell on Sunday.   In her PCP's office she was found to have high blood sugar as well as HBP.  She has not taken her lantus in last 24 hours.  She has also reported emesis brown in color.  Patient states she feels like she needs to go to SNF.  She is home alone while her husband works and only has a Marine scientist for a few hours a  day.  She is not able to take her medications while alone and states she is not sure where her reglan is so has not been taking that.   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  This is more pronounced in her left upper extremity.She is not able to walk at baseline.  She does live at home and has nursing care 3 hours out of the day.   In the ER, there was concern for DKA due to hyperglycemia.  She was given IV insulin and IVF.  CT scan of abdomen is non-remarkable.  CT scan of brain is pending , U/A is pending and patient is currently getting a PICC Line.   Chart review shows she had a recent ER visit on 5/8 with similar presentation of N/V but was able to return home with nausea meds and IVF.       Review of Systems: As mentioned in the history of present illness. All other systems reviewed and are negative. Past Medical History:  Diagnosis Date   CAD (coronary artery disease)    Depression    Diabetes mellitus without complication ([place])    History of CT scan    History of mammogram    History of MRI    Hypertension     Pheochromocytoma    Pheochromocytoma    Stroke [facility])    Thyroid disease    Past Surgical History:  Procedure Laterality Date   ABDOMINAL HYSTERECTOMY     CESAREAN SECTION     3   CORONARY ARTERY BYPASS GRAFT     Right adrenal gland removal for pheochromocytoma Right    Social History:  reports that she has never smoked. She has never used smokeless tobacco. She reports that she does not currently use alcohol. She reports that she does not use drugs.  Allergies  Allergen Reactions   Sumatriptan Anaphylaxis   Contrast Media [Iodinated Contrast Media] Swelling   Penicillins Swelling    Mouth swells up and eyes swollen shut    Family History  Problem Relation Age of Onset   Diabetes Mother    Hypertension Father    Heart attack Father    Dementia Father    Diabetes Brother    Brain cancer Brother    Asthma Daughter    Sickle cell anemia Son     Prior to Admission medications   Medication Sig Start Date End Date Taking? Authorizing Provider  acetaminophen (TYLENOL) 325 MG tablet Take 2 tablets (650 mg  total) by mouth every 6 (six) hours as needed for mild pain or headache. 02/26/21   Pokhrel, Corrie Mckusick, MD  amLODipine (NORVASC) 10 MG tablet Take 1 tablet (10 mg total) by mouth daily. For elevated BP- please call PCP for refills- in future 05/14/21   Lovorn, Jinny Blossom, MD  atorvastatin (LIPITOR) 80 MG tablet Take 80 mg by mouth daily. 06/25/21   [provider]  blood glucose meter kit and supplies KIT Dispense based on patient and insurance preference. Use up to four times daily as directed. 03/16/21   Love, Ivan Anchors, PA-C  butalbital-acetaminophen-caffeine (FIORICET) 50-325-40 MG tablet TAKE 1 TABLET BY MOUTH EVERY 6 HOURS AS NEEDED FOR MIGRANE 05/22/21   Ngetich, Dinah C, NP  clopidogrel (PLAVIX) 75 MG tablet TAKE 1 TABLET BY MOUTH ONCE DAILY 05/23/21   Ngetich, Dinah C, NP  Continuous Blood Gluc Receiver (FREESTYLE LIBRE 2 READER) DEVI Use to check blood sugar 3 times daily.  04/19/21   Charlott Rakes, MD  Continuous Blood Gluc Sensor (FREESTYLE LIBRE 2 SENSOR) MISC Use to check blood sugar 3 times daily. 04/19/21   Charlott Rakes, MD  diclofenac Sodium (VOLTAREN) 1 % GEL Apply 4 g topically 4 (four) times daily. 03/15/21   Love, Ivan Anchors, PA-C  empagliflozin (JARDIANCE) 10 MG TABS tablet Take 1 tablet (10 mg total) by mouth daily before breakfast. 06/25/21   Early Osmond, MD  escitalopram (LEXAPRO) 20 MG tablet TAKE 1 TABLET BY MOUTH EVERY MORNING 05/23/21   Ngetich, Dinah C, NP  ezetimibe (ZETIA) 10 MG tablet Take 1 tablet (10 mg total) by mouth daily. 05/23/21   Garvin Fila, MD  fluconazole (DIFLUCAN) 100 MG tablet Take one by mouth x 1 dose then repeat dose in one week 06/21/21   Ngetich, Dinah C, NP  gabapentin (NEURONTIN) 100 MG capsule TAKE 1 CAPSULE BY MOUTH THREE TIMES DAILY (BREAKFAST, LUNCH, BEDTIME) 05/23/21   Ngetich, Dinah C, NP  hydrOXYzine (ATARAX) 25 MG tablet Take 1 tablet (25 mg total) by mouth at bedtime. 06/21/21   Ngetich, Dinah C, NP  Insulin Aspart FlexPen (NOVOLOG) 100 UNIT/ML ADMINISTER 4 UNITS UNDER THE SKIN THREE TIMES DAILY WITH MEALS 07/09/21   Ngetich, Dinah C, NP  insulin glargine (LANTUS SOLOSTAR) 100 UNIT/ML Solostar Pen Inject 28 Units into the skin at bedtime. 07/05/21   Ngetich, Dinah C, NP  Insulin Pen Needle 32G X 4 MM MISC Use to inject insulin 4 times daily as directed. 03/15/21   Love, Ivan Anchors, PA-C  lip balm (CARMEX) ointment Apply topically as needed for lip care. 03/16/21   Love, Ivan Anchors, PA-C  lisinopril (ZESTRIL) 20 MG tablet Take 20 mg by mouth daily. 06/25/21   [provider]  lisinopril (ZESTRIL) 40 MG tablet Take 1 tablet (40 mg total) by mouth daily. 06/01/21   Ngetich, Dinah C, NP  loratadine (CLARITIN) 10 MG tablet Take 1 tablet (10 mg total) by mouth daily. 03/15/21   Love, Ivan Anchors, PA-C  LORazepam (ATIVAN) 0.5 MG tablet TAKE 1 TABLET BY MOUTH AT BEDTIME 05/23/21   Ngetich, Dinah C, NP  melatonin 3 MG TABS tablet  Take 3 tablets (9 mg total) by mouth at bedtime. 04/20/21   Ngetich, Dinah C, NP  methimazole (TAPAZOLE) 5 MG tablet TAKE 1 TABLET BY MOUTH EVERY MORNING 05/23/21   Ngetich, Dinah C, NP  metoCLOPramide (REGLAN) 10 MG tablet Take 1 tablet (10 mg total) by mouth 3 (three) times daily before meals. 06/21/21   Ngetich, Dinah C,  NP  metoprolol tartrate (LOPRESSOR) 100 MG tablet TAKE 1 TABLET BY MOUTH TWICE DAILY (BREAKFAST, BEDTIME) 05/23/21   Ngetich, Dinah C, NP  nystatin (MYCOSTATIN/NYSTOP) powder USE 1 APPLICATION TWICE DAILY 05/23/21   Ngetich, Dinah C, NP  ondansetron (ZOFRAN) 4 MG tablet Take 1 tablet (4 mg total) by mouth every 6 (six) hours. 06/11/21   Drenda Freeze, MD  pantoprazole (PROTONIX) 40 MG tablet TAKE 1 TABLET BY MOUTH EVERY MORNING 05/23/21   Ngetich, Dinah C, NP  polyethylene glycol powder (GLYCOLAX/MIRALAX) 17 GM/SCOOP powder Take 17 g by mouth 2 (two) times daily. 03/15/21   Love, Ivan Anchors, PA-C  rosuvastatin (CRESTOR) 40 MG tablet Take 1 tablet (40 mg total) by mouth daily. 06/25/21   Early Osmond, MD  senna (SENOKOT) 8.6 MG tablet Take 1 tablet by mouth as needed for constipation.    [provider]  tamsulosin (FLOMAX) 0.4 MG CAPS capsule TAKE 1 CAPSULE BY MOUTH AT BEDTIME 05/23/21   Ngetich, Dinah C, NP  topiramate (TOPAMAX) 25 MG tablet TAKE 1 TABLET(25 MG) BY MOUTH TWICE DAILY 06/22/21   Ngetich, Nelda Bucks, NP    Physical Exam: Vitals:   07/10/21 1221 07/10/21 1345 07/10/21 1400 07/10/21 1450  BP: 138/88 (!) 146/106 126/87 (!) 140/97  Pulse: (!) 129 (!) 131 (!) 132 (!) 140  Resp: 18 (!) 21 17 (!) 21  Temp:      TempSrc:      SpO2: 99% 97% 98% 100%   Physical Exam Constitutional:      Appearance: She is obese.  HENT:     Head: Normocephalic.     Mouth/Throat:     Mouth: Mucous membranes are dry.     Pharynx: Oropharyngeal exudate present.  Cardiovascular:     Rate and Rhythm: Tachycardia present.  Pulmonary:     Effort: Pulmonary effort is normal.      Breath sounds: Normal breath sounds.  Musculoskeletal:        General: Deformity present.  Neurological:     Mental Status: She is alert.  Psychiatric:        Attention and Perception: Attention normal.        Speech: Speech is slurred.        Behavior: Behavior normal.    Data Reviewed: Results for orders placed or performed during the hospital encounter of 07/10/21 (from the past 48 hour(s))  CBG monitoring, ED     Status: Abnormal   Collection Time: 07/10/21 11:54 AM  Result Value Ref Range   Glucose-Capillary >600 (HH) 70 - 99 mg/dL    Comment: Glucose reference range applies only to samples taken after fasting for at least 8 hours.   Comment 1 Notify RN    Comment 2 Document in Chart   I-Stat beta hCG blood, ED     Status: None   Collection Time: 07/10/21 12:19 PM  Result Value Ref Range   I-stat hCG, quantitative <5.0 <5 mIU/mL   Comment 3            Comment:   GEST. AGE      CONC.  (mIU/mL)   <=1 WEEK        5 - 50     2 WEEKS       50 - 500     3 WEEKS       100 - 10,000     4 WEEKS     1,000 - 30,000        FEMALE  AND NON-PREGNANT FEMALE:     LESS THAN 5 mIU/mL   I-stat chem 8, ED (not at [facility] or [facility])     Status: Abnormal   Collection Time: 07/10/21 12:22 PM  Result Value Ref Range   Sodium 126 (L) 135 - 145 mmol/L   Potassium 3.5 3.5 - 5.1 mmol/L   Chloride 86 (L) 98 - 111 mmol/L   BUN 15 6 - 20 mg/dL   Creatinine, Ser 1.00 0.44 - 1.00 mg/dL   Glucose, Bld 694 (HH) 70 - 99 mg/dL    Comment: Glucose reference range applies only to samples taken after fasting for at least 8 hours.   Calcium, Ion 1.05 (L) 1.15 - 1.40 mmol/L   TCO2 25 22 - 32 mmol/L   Hemoglobin 16.7 (H) 12.0 - 15.0 g/dL   HCT 49.0 (H) 36.0 - 46.0 %   Comment NOTIFIED PHYSICIAN   Basic metabolic panel     Status: Abnormal   Collection Time: 07/10/21 12:33 PM  Result Value Ref Range   Sodium 129 (L) 135 - 145 mmol/L   Potassium 3.5 3.5 - 5.1 mmol/L   Chloride 87 (L) 98 - 111 mmol/L   CO2 26 22  - 32 mmol/L   Glucose, Bld 677 (HH) 70 - 99 mg/dL    Comment: Glucose reference range applies only to samples taken after fasting for at least 8 hours. CRITICAL RESULT CALLED TO, READ BACK BY AND VERIFIED WITH: S[place],D. RN AT 1326 07/10/21 MULLINS,T    BUN 16 6 - 20 mg/dL   Creatinine, Ser 1.15 (H) 0.44 - 1.00 mg/dL   Calcium 8.9 8.9 - 10.3 mg/dL   GFR, Estimated 59 (L) >60 mL/min    Comment: (NOTE) Calculated using the CKD-EPI Creatinine Equation (2021)    Anion gap 16 (H) 5 - 15    Comment: Performed at [facility], [place] [address] [place], [place] [address]  Beta-hydroxybutyric acid     Status: None   Collection Time: 07/10/21 12:33 PM  Result Value Ref Range   Beta-Hydroxybutyric Acid 0.17 0.05 - 0.27 mmol/L    Comment: Performed at [facility], [place] [address], [place], [place] [address]  CBC with Differential (PNL)     Status: Abnormal   Collection Time: 07/10/21 12:33 PM  Result Value Ref Range   WBC 22.8 (H) 4.0 - 10.5 K/uL   RBC 5.56 (H) 3.87 - 5.11 MIL/uL   Hemoglobin 14.5 12.0 - 15.0 g/dL   HCT 43.0 36.0 - 46.0 %   MCV 77.3 (L) 80.0 - 100.0 fL   MCH 26.1 26.0 - 34.0 pg   MCHC 33.7 30.0 - 36.0 g/dL   RDW 15.8 (H) 11.5 - 15.5 %   Platelets 364 150 - 400 K/uL   nRBC 0.0 0.0 - 0.2 %   Neutrophils Relative % 87 %   Neutro Abs 19.9 (H) 1.7 - 7.7 K/uL   Lymphocytes Relative 8 %   Lymphs Abs 1.7 0.7 - 4.0 K/uL   Monocytes Relative 4 %   Monocytes Absolute 1.0 0.1 - 1.0 K/uL   Eosinophils Relative 0 %   Eosinophils Absolute 0.0 0.0 - 0.5 K/uL   Basophils Relative 0 %   Basophils Absolute 0.1 0.0 - 0.1 K/uL   Immature Granulocytes 1 %   Abs Immature Granulocytes 0.16 (H) 0.00 - 0.07 K/uL    Comment: Performed at [facility], [place] [address] [place], [place] [address]  Troponin I (High Sensitivity)  Status: Abnormal   Collection Time: 07/10/21 12:33 PM  Result Value Ref Range   Troponin I (High  Sensitivity) 21 (H) <18 ng/L    Comment: (NOTE) Elevated high sensitivity troponin I (hsTnI) values and significant  changes across serial measurements may suggest ACS but many other  chronic and acute conditions are known to elevate hsTnI results.  Refer to the Links section for chest pain algorithms and additional  guidance. Performed at [facility], [place] [address], [place], [place] [address]   Hepatic function panel     Status: Abnormal   Collection Time: 07/10/21 12:33 PM  Result Value Ref Range   Total Protein 8.0 6.5 - 8.1 g/dL   Albumin 3.5 3.5 - 5.0 g/dL   AST 13 (L) 15 - 41 U/L   ALT 14 0 - 44 U/L   Alkaline Phosphatase 116 38 - 126 U/L   Total Bilirubin 1.2 0.3 - 1.2 mg/dL   Bilirubin, Direct 0.2 0.0 - 0.2 mg/dL   Indirect Bilirubin 1.0 (H) 0.3 - 0.9 mg/dL    Comment: Performed at [facility], [place] [address] [place], [place] [address]  Lipase, blood     Status: Abnormal   Collection Time: 07/10/21 12:33 PM  Result Value Ref Range   Lipase 110 (H) 11 - 51 U/L    Comment: Performed at [facility], [place] [address] [place], [place] [address]  Blood gas, venous     Status: Abnormal   Collection Time: 07/10/21 12:38 PM  Result Value Ref Range   pH, Ven 7.4 7.25 - 7.43   pCO2, Ven 48 44 - 60 mmHg   pO2, Ven <31 (LL) 32 - 45 mmHg    Comment: CRITICAL RESULT CALLED TO, READ BACK BY AND VERIFIED WITH: HAMBLIN,H. RN AT 1306 07/10/21 MULLINS,T    Bicarbonate 29.7 (H) 20.0 - 28.0 mmol/L   Acid-Base Excess 4.0 (H) 0.0 - 2.0 mmol/L   O2 Saturation 32.5 %   Patient temperature 37.0     Comment: Performed at [facility], [place] [address], [place], [place] [address]     Assessment and Plan:   N/V with HSS (hyperosmolar hyperglycemic syndrome) -insulin gtt -BMP q 4 -IVF -IV reglan as question gastroparesis flare causing since she is without her reglan at home -U/A pending to r/o  UTI  Tachycardia -IV BB as not able to take PO- change back to home meds when able  ? Pancreatitis -trend lipase  Thyroid disease -recent TSH and free t4 -once tolerating PO will resume home meds  H/o CVA -At baseline, she has contractures and weakness in her left hemibody.  This is more pronounced in her left upper extremity.She is not able to walk at baseline.  She does live at home and has nursing care 3 hours out of the day. -CT head pending  Weakness -multifactorial -see above  Pseudohyponatremia -corrects with blood sugar  Access -has PICC Line  Leukocytosis -? Reactive -hold on abx until source found -U/A pending     Advance Care Planning:   Code Status: Prior full  Consults: none  Family Communication: husband at bedside  Severity of Illness: The appropriate patient status for this patient is INPATIENT. Inpatient status is judged to be reasonable and necessary in order to provide the required intensity of service to ensure the patient's safety. The patient's presenting symptoms, physical exam findings, and initial radiographic and laboratory data in the context of their chronic comorbidities is  felt to place them at high risk for further clinical deterioration. Furthermore, it is not anticipated that the patient will be medically stable for discharge from the hospital within 2 midnights of admission.   * I certify that at the point of admission it is my clinical judgment that the patient will require inpatient hospital care spanning beyond 2 midnights from the point of admission due to high intensity of service, high risk for further deterioration and high frequency of surveillance required.*  Author: Geradine Girt, DO 07/10/2021 3:12 PM  For on call review [email or web address].

## 2021-07-11 ENCOUNTER — Encounter (HOSPITAL_COMMUNITY): Payer: Self-pay | Admitting: Internal Medicine

## 2021-07-11 ENCOUNTER — Ambulatory Visit: Payer: Medicaid Other | Admitting: Family Medicine

## 2021-07-11 DIAGNOSIS — E1100 Type 2 diabetes mellitus with hyperosmolarity without nonketotic hyperglycemic-hyperosmolar coma (NKHHC): Secondary | ICD-10-CM | POA: Diagnosis not present

## 2021-07-11 LAB — BASIC METABOLIC PANEL
Anion gap: 10 (ref 5–15)
Anion gap: 8 (ref 5–15)
Anion gap: 9 (ref 5–15)
BUN: 12 mg/dL (ref 6–20)
BUN: 13 mg/dL (ref 6–20)
BUN: 9 mg/dL (ref 6–20)
CO2: 25 mmol/L (ref 22–32)
CO2: 26 mmol/L (ref 22–32)
CO2: 26 mmol/L (ref 22–32)
Calcium: 7.7 mg/dL — ABNORMAL LOW (ref 8.9–10.3)
Calcium: 8 mg/dL — ABNORMAL LOW (ref 8.9–10.3)
Calcium: 8.1 mg/dL — ABNORMAL LOW (ref 8.9–10.3)
Chloride: 100 mmol/L (ref 98–111)
Chloride: 99 mmol/L (ref 98–111)
Chloride: 99 mmol/L (ref 98–111)
Creatinine, Ser: 0.54 mg/dL (ref 0.44–1.00)
Creatinine, Ser: 0.59 mg/dL (ref 0.44–1.00)
Creatinine, Ser: 0.61 mg/dL (ref 0.44–1.00)
GFR, Estimated: >60 mL/min (ref >60)
GFR, Estimated: >60 mL/min (ref >60)
GFR, Estimated: >60 mL/min (ref >60)
Glucose, Bld: 155 mg/dL — ABNORMAL HIGH (ref 70–99)
Glucose, Bld: 162 mg/dL — ABNORMAL HIGH (ref 70–99)
Glucose, Bld: 250 mg/dL — ABNORMAL HIGH (ref 70–99)
Potassium: 2.8 mmol/L — ABNORMAL LOW (ref 3.5–5.1)
Potassium: 3.1 mmol/L — ABNORMAL LOW (ref 3.5–5.1)
Potassium: 3.6 mmol/L (ref 3.5–5.1)
Sodium: 133 mmol/L — ABNORMAL LOW (ref 135–145)
Sodium: 134 mmol/L — ABNORMAL LOW (ref 135–145)
Sodium: 135 mmol/L (ref 135–145)

## 2021-07-11 LAB — GLUCOSE, CAPILLARY
Glucose-Capillary: 165 mg/dL — ABNORMAL HIGH (ref 70–99)
Glucose-Capillary: 287 mg/dL — ABNORMAL HIGH (ref 70–99)
Glucose-Capillary: 201 mg/dL — ABNORMAL HIGH (ref 70–99)
Glucose-Capillary: 258 mg/dL — ABNORMAL HIGH (ref 70–99)
Glucose-Capillary: 249 mg/dL — ABNORMAL HIGH (ref 70–99)

## 2021-07-11 LAB — CBC
HCT: 32.7 % — ABNORMAL LOW (ref 36.0–46.0)
Hemoglobin: 11 g/dL — ABNORMAL LOW (ref 12.0–15.0)
MCH: 26.1 pg (ref 26.0–34.0)
MCHC: 33.6 g/dL (ref 30.0–36.0)
MCV: 77.7 fL — ABNORMAL LOW (ref 80.0–100.0)
Platelets: 280 10*3/uL (ref 150–400)
RBC: 4.21 MIL/uL (ref 3.87–5.11)
RDW: 15.7 % — ABNORMAL HIGH (ref 11.5–15.5)
WBC: 14.9 10*3/uL — ABNORMAL HIGH (ref 4.0–10.5)
nRBC: 0 % (ref 0.0–0.2)

## 2021-07-11 LAB — LIPASE, BLOOD: Lipase: 42 U/L (ref 11–51)

## 2021-07-11 LAB — OCCULT BLOOD GASTRIC / DUODENUM (SPECIMEN CUP)
Occult Blood, Gastric: POSITIVE — AB
pH, Gastric: 6

## 2021-07-11 LAB — MAGNESIUM: Magnesium: 1.4 mg/dL — ABNORMAL LOW (ref 1.7–2.4)

## 2021-07-11 LAB — BETA-HYDROXYBUTYRIC ACID: Beta-Hydroxybutyric Acid: 0.33 mmol/L — ABNORMAL HIGH (ref 0.05–0.27)

## 2021-07-11 MED ORDER — METHIMAZOLE 5 MG PO TABS
5.0000 mg | ORAL_TABLET | Freq: Every day | ORAL | Status: DC
  Administered 2021-07-11 – 2021-07-18 (×8): 5 mg via ORAL
  Filled 2021-07-11 (×8): qty 1

## 2021-07-11 MED ORDER — INSULIN GLARGINE-YFGN 100 UNIT/ML ~~LOC~~ SOLN
11.0000 [IU] | Freq: Every day | SUBCUTANEOUS | Status: DC
  Filled 2021-07-11: qty 0.11

## 2021-07-11 MED ORDER — PHENOL 1.4 % MT LIQD: 1.0000 | OROMUCOSAL | Status: DC | PRN

## 2021-07-11 MED ORDER — SODIUM CHLORIDE 0.9 % IV SOLN
2.0000 g | INTRAVENOUS | Status: DC
  Administered 2021-07-11 – 2021-07-17 (×7): 2 g via INTRAVENOUS
  Filled 2021-07-11 (×8): qty 20

## 2021-07-11 MED ORDER — ROSUVASTATIN CALCIUM 10 MG PO TABS
40.0000 mg | ORAL_TABLET | Freq: Every day | ORAL | Status: DC
  Administered 2021-07-11 – 2021-07-18 (×8): 40 mg via ORAL
  Filled 2021-07-11 (×4): qty 4
  Filled 2021-07-11: qty 2
  Filled 2021-07-11 (×3): qty 4

## 2021-07-11 MED ORDER — GLUCERNA SHAKE PO LIQD
237.0000 mL | Freq: Three times a day (TID) | ORAL | Status: DC
  Administered 2021-07-11 – 2021-07-17 (×12): 237 mL via ORAL
  Filled 2021-07-11 (×25): qty 237

## 2021-07-11 MED ORDER — MELATONIN 5 MG PO TABS
10.0000 mg | ORAL_TABLET | Freq: Every evening | ORAL | Status: DC | PRN
  Administered 2021-07-12 – 2021-07-15 (×4): 10 mg via ORAL
  Filled 2021-07-11 (×4): qty 2

## 2021-07-11 MED ORDER — SODIUM CHLORIDE 0.9 % IV SOLN: INTRAVENOUS | Status: DC

## 2021-07-11 MED ORDER — POTASSIUM CHLORIDE 10 MEQ/100ML IV SOLN
10.0000 meq | INTRAVENOUS | Status: AC
  Administered 2021-07-11 (×3): 10 meq via INTRAVENOUS
  Filled 2021-07-11 (×3): qty 100

## 2021-07-11 MED ORDER — MAGIC MOUTHWASH
5.0000 mL | Freq: Three times a day (TID) | ORAL | Status: DC
  Administered 2021-07-11 – 2021-07-18 (×15): 5 mL via ORAL
  Filled 2021-07-11 (×25): qty 5

## 2021-07-11 MED ORDER — MAGNESIUM SULFATE 2 GM/50ML IV SOLN
2.0000 g | Freq: Once | INTRAVENOUS | Status: AC
  Administered 2021-07-11: 2 g via INTRAVENOUS
  Filled 2021-07-11: qty 50

## 2021-07-11 MED ORDER — ORAL CARE MOUTH RINSE
15.0000 mL | Freq: Two times a day (BID) | OROMUCOSAL | Status: DC
  Administered 2021-07-11 – 2021-07-18 (×12): 15 mL via OROMUCOSAL

## 2021-07-11 MED ORDER — NYSTATIN 100000 UNIT/GM EX POWD
Freq: Three times a day (TID) | CUTANEOUS | Status: DC
  Filled 2021-07-11: qty 15

## 2021-07-11 MED ORDER — INSULIN GLARGINE-YFGN 100 UNIT/ML ~~LOC~~ SOLN
11.0000 [IU] | Freq: Every day | SUBCUTANEOUS | Status: DC
  Administered 2021-07-11: 11 [IU] via SUBCUTANEOUS
  Filled 2021-07-11 (×3): qty 0.11

## 2021-07-11 MED ORDER — CLOPIDOGREL BISULFATE 75 MG PO TABS
75.0000 mg | ORAL_TABLET | Freq: Every day | ORAL | Status: DC
  Administered 2021-07-11 – 2021-07-18 (×8): 75 mg via ORAL
  Filled 2021-07-11 (×8): qty 1

## 2021-07-11 MED ORDER — POTASSIUM CHLORIDE CRYS ER 20 MEQ PO TBCR
40.0000 meq | EXTENDED_RELEASE_TABLET | Freq: Once | ORAL | Status: AC
  Administered 2021-07-11: 40 meq via ORAL
  Filled 2021-07-11: qty 2

## 2021-07-11 MED ORDER — PANTOPRAZOLE SODIUM 40 MG PO TBEC
40.0000 mg | DELAYED_RELEASE_TABLET | Freq: Once | ORAL | Status: AC
Start: 2021-07-11 — End: 2021-07-11
  Administered 2021-07-11: 40 mg via ORAL
  Filled 2021-07-11: qty 1

## 2021-07-11 MED ORDER — LORAZEPAM 0.5 MG PO TABS
0.5000 mg | ORAL_TABLET | Freq: Every day | ORAL | Status: DC
  Administered 2021-07-11 – 2021-07-17 (×7): 0.5 mg via ORAL
  Filled 2021-07-11 (×7): qty 1

## 2021-07-11 MED ORDER — EZETIMIBE 10 MG PO TABS
10.0000 mg | ORAL_TABLET | Freq: Every day | ORAL | Status: DC
  Administered 2021-07-11 – 2021-07-18 (×8): 10 mg via ORAL
  Filled 2021-07-11 (×8): qty 1

## 2021-07-11 NOTE — TOC Initial Note (Signed)
Transition of Care [facility]) - Initial/Assessment Note   Patient Details  Name: Lori [name] MRN: [id number] Date of Birth: 07-08-1970  Transition of [facility]) CM/SW Contact:    Sherie Don, LCSW Phone Number: 07/11/2021, 1:26 PM  Clinical Narrative: TOC received consult for possible placement as patient has issues at home with being able to take her medications when alone. CSW met with patient to discuss consult. Per patient, she resides at home with her husband and is currently getting 3 hours of PCS per day, but needs more assistance. Patient reported an application to the CAP program was submitted, but she does not know the status of her application at this time.  CSW asked about SNF. Patient reported she has been to [facility] before and does not want to return. CSW explained that if patient were referred out for placement, patient would be required to sign over her check to the facility and stay for 30 days as rehab is not a covered benefit for Medicaid and this would cover room and board only. Patient does not think she can afford to sign over her check at this time. CSW encouraged patient to discuss it with her husband and reiterated placement would require the monthly check. TOC to follow.  Expected Discharge Plan: Home/Self Care Barriers to Discharge: Continued Medical Work up  Patient Goals and CMS Choice Patient states their goals for this hospitalization and ongoing recovery are:: Get approved for additional hours through the CAP program  Expected Discharge Plan and Services Expected Discharge Plan: Home/Self Care In-house Referral: Clinical Social Work Living arrangements for the past 2 months: Single Family Home            DME Arranged: N/A DME Agency: NA  Prior Living Arrangements/Services Living arrangements for the past 2 months: Single Family Home Lives with:: Spouse Patient language and need for interpreter reviewed:: Yes Do you feel safe going back to the  place where you live?: Yes      Need for Family Participation in Patient Care: Yes (Comment) Care giver support system in place?: Yes (comment) Criminal Activity/Legal Involvement Pertinent to Current Situation/Hospitalization: No - Comment as needed  Activities of Daily Living Home Assistive Devices/Equipment: Eyeglasses, Other (Comment) (pt unable to state what other equipment she uses) ADL Screening (condition at time of admission) Patient's cognitive ability adequate to safely complete daily activities?: No Is the patient deaf or have difficulty hearing?: No Does the patient have difficulty seeing, even when wearing glasses/contacts?: No Does the patient have difficulty concentrating, remembering, or making decisions?: Yes Patient able to express need for assistance with ADLs?: No Does the patient have difficulty dressing or bathing?: Yes Independently performs ADLs?: No Communication: Needs assistance Is this a change from baseline?: Change from baseline, expected to last <3 days Dressing (OT): Needs assistance Is this a change from baseline?: Change from baseline, expected to last <3days Grooming: Needs assistance Is this a change from baseline?: Change from baseline, expected to last <3 days Feeding: Needs assistance Is this a change from baseline?: Change from baseline, expected to last <3 days Bathing: Needs assistance Is this a change from baseline?: Change from baseline, expected to last <3 days Toileting: Needs assistance Is this a change from baseline?: Change from baseline, expected to last <3 days In/Out Bed: Needs assistance Is this a change from baseline?: Change from baseline, expected to last <3 days Walks in Home: Dependent Is this a change from baseline?: Change from baseline, expected to last <3  days Does the patient have difficulty walking or climbing stairs?: Yes Weakness of Legs: Both Weakness of Arms/Hands: Both  Emotional Assessment Appearance:: Appears  stated age Attitude/Demeanor/Rapport: Engaged Affect (typically observed): Appropriate Orientation: : Oriented to Self, Oriented to Place, Oriented to  Time, Oriented to Situation Alcohol / Substance Use: Not Applicable  Admission diagnosis:  Tachycardia [R00.0] Intractable nausea and vomiting [R11.2] Hyperosmolar hyperglycemic state (HHS) ([place]) [E11.00] Patient Active Problem List   Diagnosis Date Noted   Hyperosmolar hyperglycemic state (HHS) ([place]) 07/10/2021   Pseudohyponatremia 07/10/2021   Thyroid disease 07/10/2021   Functional urinary incontinence 05/17/2021   MDD (major depressive disorder), recurrent episode, severe ([place]) 04/09/2021   Diabetic gastroparesis ([place]) 03/16/2021   Migraines 03/16/2021   History of cerebral infarction    Debility 02/26/2021   DKA (diabetic ketoacidosis) ([place]) 02/19/2021   Constipation    Weakness    Type 2 diabetes mellitus with hyperlipidemia ([place])    Aphasia    Spell of abnormal behavior    Stroke ([place]) 01/19/2021   Tachycardia 01/19/2021   CAD (coronary artery disease) 01/19/2021   CVA (cerebral vascular accident) ([place]) 01/19/2021   Diabetes ([place])    Hyperthyroidism    Essential hypertension    Pheochromocytoma    Anxiety and depression    HLD (hyperlipidemia)    PCP:  [name], NP Pharmacy:   [facility] [place], [place] - [place] AT [place] [address] Phone: [phone] Fax: [place], [place] [address] Phone: [phone] Fax: [phone]  Readmission Risk Interventions    07/11/2021    1:22 PM 02/22/2021   12:36 PM  Readmission Risk Prevention Plan  Transportation Screening Complete Complete  PCP or Specialist Appt within 3-5 Days  Complete  HRI or Home Care Consult  Complete  Social Work Consult for Recovery Care  Planning/Counseling  Complete  Palliative Care Screening  Complete  Medication Review Press photographer) Complete Complete  HRI or Home Care Consult Complete   SW Recovery Care/Counseling Consult Complete   Palliative Care Screening Not Applicable   Skilled Nursing Facility Complete

## 2021-07-11 NOTE — Progress Notes (Signed)
Inpatient Diabetes Program Recommendations  AACE/ADA: New Consensus Statement on Inpatient Glycemic Control (2015)  Target Ranges:  Prepandial:   less than 140 mg/dL      Peak postprandial:   less than 180 mg/dL (1-2 hours)      Critically ill patients:  140 - 180 mg/dL   Lab Results  Component Value Date   GLUCAP 201 (H) 07/11/2021   HGBA1C 8.4 (H) 04/24/2021    Review of Glycemic Control  Diabetes history: DM2 Outpatient Diabetes medications: Lantus 29 units QHS, Novolog 7 units TID, Jardiance 10 mg QD Current orders for Inpatient glycemic control: Semglee 11 units QHS, Novolog 0-15 units TID with meals   HgbA1C - 8.4% (04/24/21)  Inpatient Diabetes Program Recommendations:    Consider increasing Semglee to 15 units QHS Add Novolog HS correction Consider Novolog 3 units TID with meals Current HgbA1C. Last one 04/24/21.  Spoke with pt at bedside regarding her diabetes care. Pt states she was recently started on Jardiance and said she's had some hypoglycemia since starting it. States "I don't want to take the Jardiance." Gives both Lantus and Humalog insulin with insulin pens injecting in abdomen. Monitors blood sugars with Freestyle Libre.  May benefit from updated HgbA1C. Pt getting ready to transfer out of ICU.   Will f/u in am.   Thank you. Lorenda Peck, RD, LDN, CDE Inpatient Diabetes Coordinator [phone]

## 2021-07-11 NOTE — Evaluation (Signed)
Clinical/Bedside Swallow Evaluation Patient Details  Name: Lori [name] MRN: [id number] Date of Birth: May 12, 1970  Today's Date: 07/11/2021 Time: SLP Start Time (ACUTE ONLY): 31 SLP Stop Time (ACUTE ONLY): 1039 SLP Time Calculation (min) (ACUTE ONLY): 34 min  Past Medical History:  Past Medical History:  Diagnosis Date   CAD (coronary artery disease)    Depression    Diabetes mellitus without complication ([place])    History of CT scan    History of mammogram    History of MRI    Hypertension    Pheochromocytoma    Pheochromocytoma    Stroke [facility])    Thyroid disease    Past Surgical History:  Past Surgical History:  Procedure Laterality Date   ABDOMINAL HYSTERECTOMY     CESAREAN SECTION     3   CORONARY ARTERY BYPASS GRAFT     Right adrenal gland removal for pheochromocytoma Right    HPI:  Per MD, "Lori [name] is a 51 y.o. female with medical history significant of DM, h.o CVA, CAD and depression who was sent to the ER from her PCP's office with severe N/V and dizziness.  She has been vomiting since Thursday.  She fell on Sunday."  Chest imaging concerning for opacity.  MRI showed remote cerebellar infarct and prior basal ganglia CVA.  Pt underwent MBS during hospital admit 01/2021 showing trace asp of thin via cup/straw - not tsp.  Various compensations attempts without effectiveness - and chin tuck WORSENED aiwary protection.  Pt reports she is consuming regular/thin diet at home and takes her pills whole with applesauce.    Assessment / Plan / Recommendation  Clinical Impression  Pt with known h/o dysphagia - documented MBS 01/2021 showing silent asp of thin via cup/straw.  Today pt admits her voice and cough are weaker than normal.  Mulitple CN deficits apparent concerning for facial, trigeminal, hypoglossal, glossopharyngeal and vagus nerve involvement. Several compensation strategies attempted during 01/2021 MBS and were not found to be effective.  Decreased  bilateral labial seal noted - pt reports due to ulcerations causing discomrt.  Prolonged mastication with solids -with pt extending chin/head upward- eliciting a gag.  She uses liquids to orally transit solids.  At this time, recommend full liquid/nectar and allowance of tsps of thin between meals. Hopeful for dietary advancement as pt medically progresses. H/o slow clearance below UES on prior MBS - concerning for esophageal involvement, thus strict precautions advised. SLP Visit Diagnosis: Dysphagia, oropharyngeal phase (R13.12)    Aspiration Risk  Moderate aspiration risk    Diet Recommendation Nectar-thick liquid;Other (Comment) (full liquid, tsps thin ok)   Liquid Administration via: Straw;Spoon;Cup Medication Administration: Other (Comment) (whole with yogurt) Supervision: Full supervision/cueing for compensatory strategies;Staff to assist with self feeding Compensations: Slow rate;Small sips/bites Postural Changes: Seated upright at 90 degrees;Remain upright for at least 30 minutes after po intake (partially upright after meal)    Other  Recommendations Oral Care Recommendations: Other (Comment) (oral care after po) Other Recommendations: Have oral suction available    Recommendations for follow up therapy are one component of a multi-disciplinary discharge planning process, led by the attending physician.  Recommendations may be updated based on patient status, additional functional criteria and insurance authorization.  Follow up Recommendations Other (comment) (pt reported preference for SNF to MD)      Assistance Recommended at Discharge Frequent or constant Supervision/Assistance  Functional Status Assessment Patient has had a recent decline in their functional status and demonstrates the ability to  make significant improvements in function in a reasonable and predictable amount of time.  Frequency and Duration min 2x/week  2 weeks       Prognosis Prognosis for Safe Diet  Advancement: Good Barriers to Reach Goals: Time post onset      Swallow Study   General Date of Onset: 07/11/21 HPI: Per MD, "Lori [name] is a 51 y.o. female with medical history significant of DM, h.o CVA, CAD and depression who was sent to the ER from her PCP's office with severe N/V and dizziness.  She has been vomiting since Thursday.  She fell on Sunday."  Chest imaging concerning for opacity.  MRI showed remote cerebellar infarct and prior basal ganglia CVA.  Pt underwent MBS during hospital admit 01/2021 showing trace asp of thin via cup/straw - not tsp.  Various compensations attempts without effectiveness - and chin tuck WORSENED aiwary protection.  Pt reports she is consuming regular/thin diet at home and takes her pills whole with applesauce. Type of Study: Bedside Swallow Evaluation Previous Swallow Assessment: see HPI Diet Prior to this Study: Regular;Thin liquids (carb modified) Temperature Spikes Noted: Yes Respiratory Status: Room air History of Recent Intubation: No Behavior/Cognition: Alert;Cooperative;Pleasant mood Oral Cavity Assessment: Lesions;Other (comment) (ulceration) Oral Care Completed by SLP: No Oral Cavity - Dentition: Adequate natural dentition;Other (Comment) (no lower dentition and no dentures) Vision: Functional for self-feeding (with right hand) Patient Positioning: Upright in bed Baseline Vocal Quality: Low vocal intensity;Suspected CN X (Vagus) involvement (pt reports this is baseline) Volitional Cough: Weak Volitional Swallow: Able to elicit (with significant effort)    Oral/Motor/Sensory Function Overall Oral Motor/Sensory Function: Moderate impairment Facial ROM: Reduced left Facial Symmetry: Abnormal symmetry left Facial Strength: Reduced left;Reduced right;Other (Comment) (labial) Facial Sensation: Within Functional Limits Lingual ROM: Reduced left;Suspected CN XII (hypoglossal) dysfunction Lingual Symmetry: Within Functional  Limits Lingual Strength: Reduced;Suspected CN XII (hypoglossal) dysfunction Velum: Other (comment) (sluggish, did not stimulate due to concerns to elicit n/v) Mandible: Within Functional Limits   Ice Chips Ice chips: Impaired Presentation: Spoon Oral Phase Impairments: Reduced labial seal;Reduced lingual movement/coordination Oral Phase Functional Implications: Prolonged oral transit   Thin Liquid Thin Liquid: Impaired Presentation: Straw;Self Fed Oral Phase Impairments: Reduced labial seal;Reduced lingual movement/coordination Oral Phase Functional Implications: Prolonged oral transit;Other (comment);Right anterior spillage Pharyngeal  Phase Impairments: Suspected delayed Swallow Other Comments: lingual thrusting    Nectar Thick Nectar Thick Liquid: Impaired Presentation: Straw Oral Phase Impairments: Reduced labial seal Pharyngeal Phase Impairments: Suspected delayed Swallow   Honey Thick Honey Thick Liquid: Not tested   Puree Puree: Impaired (jello) Presentation: Spoon Oral Phase Impairments: Reduced labial seal;Reduced lingual movement/coordination;Poor awareness of bolus Oral Phase Functional Implications: Prolonged oral transit;Oral residue Pharyngeal Phase Impairments: Suspected delayed Swallow   Solid     Solid: Impaired Oral Phase Impairments: Reduced lingual movement/coordination;Impaired mastication;Poor awareness of bolus Pharyngeal Phase Impairments: Suspected delayed [place] Abbot 07/11/2021,11:14 AM   Kathleen Lime, MS [place] [facility] [phone] Pager [phone]

## 2021-07-11 NOTE — Progress Notes (Signed)
PROGRESS NOTE    Lori [name]  CXK:[id number] DOB: Jan 12, 1971 DOA: 07/10/2021 PCP: [name], NP   Brief Narrative: 51 year old with past medical history significant for diabetes, history of CVA, CAD, depression who presented to the ED referred by her PCP office due to severe nausea vomiting and dizziness.  She was found to have high blood sugar and high blood pressure.  She has not been able to take her Lantus in 24 hours.  At baseline she has contractures and weakness in her left hemibody.  More pronounced in the upper extremity left.  She is not able to walk at baseline.  She lives at home and has nursing care for 3 hours a day. CT abdomen and pelvis is nonremarkable.  CT head no acute intracranial abnormality.  Remote left cerebellar infarct, chronic basal ganglia lacunar infarct and age advanced chronic small vessel ischemia.   Assessment & Plan:   Principal Problem:   Hyperosmolar hyperglycemic state (HHS) ([place]) Active Problems:   Tachycardia   Anxiety and depression   Weakness   History of cerebral infarction   Pseudohyponatremia   Thyroid disease  1-Hyperosmolar hyperglycemic syndrome\nausea vomiting\\ She was treated with IV fluids, insulin Gtt.  IV reglan for gastroparesis.  Treating for aspiration PNA>  She has been transition to semglee daily, SSI.   2-Tachycardia:  IV fluids.  Replete electrolytes.  On metoprolol at home, currently on IV metoprolol.  Resume oral when tolerates oral med.   3-Pancreatitis:  Mild.  Received IV fluids.  Start diet.    Aspiration PNA;  Chest x ray with Streaky perihilar and peribronchial opacities in bilateral lower lobes may represent atelectasis or peribronchial airspace consolidation. Patient report cough, had low grade fever.  She is at risk for aspiration.  Started IV ceftriaxone.  Speech swallow consulted.   Dysphagia; chronic Evaluated by speech recommend full liquid nectar thick.   Thyroid Disease:   Resume methimazole.   H/O CVA;;  Resume plavix, statins.   Weakness: PT, OT consulted. Will need rehab.   Pseudohyponatremia:  Resolved.   Access Picc line in place  Leukocytosis.  Pyuria; UA with 21/50 WBC. Urine culture ordered.  Started IV ceftriaxone.   Hypomagnesemia; replete IV>  Hypokalemia; replete orally.    Estimated body mass index is 37.31 kg/m as calculated from the following:   Height as of an earlier encounter on 07/10/21: '5\' 2"'$  (1.575 m).   Weight as of an earlier encounter on 07/10/21: 92.5 kg.   DVT prophylaxis: Lovenox Code Status: Full code Family Communication: Care discussed with patient.  Disposition Plan:  Status is: Inpatient Remains inpatient appropriate because: management of hyperglycemia, gastroparesis, aspiration PNA    Consultants:  None  Procedures:  None  Antimicrobials:  IV ceftriaxone.   Subjective: she is alert, she report chronic aphasia, dysarthria. She denies abdominal pain.  She had small vomiting episode when nurse turn patient.   Objective: Vitals:   07/11/21 0300 07/11/21 0400 07/11/21 0500 07/11/21 0600  BP: 123/88 (!) 145/92 134/72 118/89  Pulse: (!) 110 (!) 115 (!) 114 100  Resp: 20 (!) 22 (!) 23 (!) 27  Temp:  98.9 F (37.2 C)    TempSrc:  Axillary    SpO2: 95% 96% 96% 91%    Intake/Output Summary (Last 24 hours) at 07/11/2021 9702 Last data filed at 07/11/2021 6378 Gross per 24 hour  Intake 3973.11 ml  Output 400 ml  Net 3573.11 ml   There were no vitals filed for this visit.  Examination:  General exam: Appears calm and comfortable  Respiratory system: Clear to auscultation. Respiratory effort normal. Cardiovascular system: S1 & S2 heard, RRR.  Gastrointestinal system: Abdomen is nondistended, soft and nontender. No organomegaly or masses felt. Normal bowel sounds heard. Central nervous system: Alert aphasia, dysarthria. Left side weakness.  Extremities: left side weakness, contraction.    Data  Reviewed: I have personally reviewed following labs and imaging studies  CBC: Recent Labs  Lab 07/10/21 1222 07/10/21 1233  WBC  --  22.8*  NEUTROABS  --  19.9*  HGB 16.7* 14.5  HCT 49.0* 43.0  MCV  --  77.3*  PLT  --  678   Basic Metabolic Panel: Recent Labs  Lab 07/10/21 1233 07/10/21 1754 07/10/21 1946 07/11/21 0000 07/11/21 0300  NA 129* 134* 137 133* 135  K 3.5 4.2 3.0* 2.8* 3.1*  CL 87* 101 101 99 99  CO2 '26 22 27 26 26  '$ GLUCOSE 677* 136* 90 162* 155*  BUN '16 12 14 13 12  '$ CREATININE 1.15* 0.56 0.67 0.61 0.54  CALCIUM 8.9 7.6* 8.1* 7.7* 8.0*  MG  --   --   --   --  1.4*   GFR: Estimated Creatinine Clearance: 91.1 mL/min (by C-G formula based on SCr of 0.54 mg/dL). Liver Function Tests: Recent Labs  Lab 07/10/21 1233  AST 13*  ALT 14  ALKPHOS 116  BILITOT 1.2  PROT 8.0  ALBUMIN 3.5   Recent Labs  Lab 07/10/21 1233  LIPASE 110*   No results for input(s): AMMONIA in the last 168 hours. Coagulation Profile: No results for input(s): INR, PROTIME in the last 168 hours. Cardiac Enzymes: No results for input(s): CKTOTAL, CKMB, CKMBINDEX, TROPONINI in the last 168 hours. BNP (last 3 results) No results for input(s): PROBNP in the last 8760 hours. HbA1C: No results for input(s): HGBA1C in the last 72 hours. CBG: Recent Labs  Lab 07/10/21 1845 07/10/21 1948 07/10/21 2100 07/10/21 2157 07/10/21 2259  GLUCAP 179* 140* 155* 155* 175*   Lipid Profile: No results for input(s): CHOL, HDL, LDLCALC, TRIG, CHOLHDL, LDLDIRECT in the last 72 hours. Thyroid Function Tests: No results for input(s): TSH, T4TOTAL, FREET4, T3FREE, THYROIDAB in the last 72 hours. Anemia Panel: No results for input(s): VITAMINB12, FOLATE, FERRITIN, TIBC, IRON, RETICCTPCT in the last 72 hours. Sepsis Labs: No results for input(s): PROCALCITON, LATICACIDVEN in the last 168 hours.  Recent Results (from the past 240 hour(s))  MRSA Next Gen by PCR, Nasal     Status: None    Collection Time: 07/10/21  7:55 PM   Specimen: Nasal Mucosa; Nasal Swab  Result Value Ref Range Status   MRSA by PCR Next Gen NOT DETECTED NOT DETECTED Final    Comment: (NOTE) The GeneXpert MRSA Assay (FDA approved for NASAL specimens only), is one component of a comprehensive MRSA colonization surveillance program. It is not intended to diagnose MRSA infection nor to guide or monitor treatment for MRSA infections. Test performance is not FDA approved in patients less than 54 years old. Performed at [facility], [place] [address], [place], [place] [address]          Radiology Studies: CT ABDOMEN PELVIS WO CONTRAST  Result Date: 07/10/2021 CLINICAL DATA:  Nausea and vomiting since Friday. EXAM: CT ABDOMEN AND PELVIS WITHOUT CONTRAST TECHNIQUE: Multidetector CT imaging of the abdomen and pelvis was performed following the standard protocol without IV contrast. RADIATION DOSE REDUCTION: This exam was performed according to the departmental dose-optimization  program which includes automated exposure control, adjustment of the mA and/or kV according to patient size and/or use of iterative reconstruction technique. COMPARISON:  Jun 11, 2021 CT abdomen pelvis FINDINGS: Lower chest: Bibasilar atelectasis versus scarring. Prior median sternotomy and CABG. Hepatobiliary: Unremarkable noncontrast appearance of the hepatic parenchyma. Gallbladder is unremarkable. No biliary ductal dilation. Pancreas: Mild stranding along the pancreatic head may reflect pancreatitis recommend correlation with serum lipase. No pancreatic ductal dilation. Spleen: No splenomegaly. 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 Mild thickening of the left adrenal gland without discrete nodularity is similar prior. No hydronephrosis. No renal, ureteral or bladder calculi. Urinary bladder is unremarkable for degree of distension. Stomach/Bowel: No radiopaque enteric contrast material was  administered. Small hiatal hernia otherwise the stomach is unremarkable for degree of distension. No pathologic dilation of small or large bowel. Terminal ileum appears normal. The appendix is not confidently identified however there is no pericecal inflammation. Evidence of acute bowel inflammation. Vascular/Lymphatic: Extensive aortic and branch vessel atherosclerosis without abdominal aortic aneurysm. No pathologically enlarged abdominal or pelvic lymph nodes. Reproductive: Status post hysterectomy. No adnexal masses. Other: No significant abdominopelvic free fluid. No pneumoperitoneum. Similar size of the heterogeneous soft tissue nodularity in the anterior abdominal wall which measures 3.3 x 2.0 cm on image 84/4 previously 3.4 x 2.2 cm, and appears to be at the level of patient's known C-section scar. Musculoskeletal: No acute osseous abnormality. IMPRESSION: 1. No acute abnormality in the abdomen or pelvis on this noncontrast CT, specifically no evidence of bowel obstruction or acute bowel inflammation. 2. No significant interval change 3.3 cm heterogeneous soft tissue density within the deep subcutaneous soft tissues of the lower anterior abdominal wall potentially related to prior surgery. 3. Prior right adrenalectomy. Electronically Signed   By: Dahlia Bailiff M.D.   On: 07/10/2021 14:35   CT Head Wo Contrast  Result Date: 07/10/2021 CLINICAL DATA:  Altered mental status. Mental status change of unknown cause. Headache and vomiting. Chronic left-sided weakness. EXAM: CT HEAD WITHOUT CONTRAST TECHNIQUE: Contiguous axial images were obtained from the base of the skull through the vertex without intravenous contrast. RADIATION DOSE REDUCTION: This exam was performed according to the departmental dose-optimization program which includes automated exposure control, adjustment of the mA and/or kV according to patient size and/or use of iterative reconstruction technique. COMPARISON:  Head CT and brain MRI  06/11/2021 FINDINGS: Brain: No acute intracranial hemorrhage. There is no evidence of acute ischemia. Remote lacunar infarcts within bilateral basal ganglia and remote left cerebellar infarct, unchanged. No hydrocephalus. Periventricular and deep white matter hypodensity typical of chronic small vessel ischemia, stable. Vascular: Atherosclerosis of skullbase vasculature without hyperdense vessel or abnormal calcification. Skull: No fracture or focal lesion. Sinuses/Orbits: Paranasal sinuses and mastoid air cells are clear. The visualized orbits are unremarkable. Other: None. IMPRESSION: 1. No acute intracranial abnormality. 2. Remote left cerebellar infarct, chronic basal gangliar lacunar infarcts and age advanced chronic small vessel ischemia. Electronically Signed   By: Keith Rake M.D.   On: 07/10/2021 16:18   DG Chest Portable 1 View  Result Date: 07/10/2021 CLINICAL DATA:  Altered mental status, vomiting. EXAM: PORTABLE CHEST 1 VIEW COMPARISON:  Jun 11, 2021 FINDINGS: Postsurgical changes from CABG. Cardiomediastinal silhouette is normal. Mediastinal contours appear intact. Streaky perihilar and peribronchial opacities in bilateral lower lobes. Low lung volumes. Osseous structures are without acute abnormality. Soft tissues are grossly normal. IMPRESSION: Streaky perihilar and peribronchial opacities in bilateral lower lobes may represent atelectasis or peribronchial airspace consolidation.  Electronically Signed   By: Fidela Salisbury M.D.   On: 07/10/2021 12:42   DG Abd Portable 1 View  Result Date: 07/10/2021 CLINICAL DATA:  Nausea and vomiting EXAM: PORTABLE ABDOMEN - 1 VIEW COMPARISON:  02/14/2021 FINDINGS: The bowel gas pattern is normal. No radio-opaque calculi or other significant radiographic abnormality are seen. Age advanced aortoiliac atherosclerotic calcification. IMPRESSION: 1. Nonobstructive bowel gas pattern. 2. Age advanced aortoiliac atherosclerotic calcification. Electronically  Signed   By: Davina Poke D.O.   On: 07/10/2021 12:43   [place] EKG SITE RITE  Result Date: 07/10/2021 If Site Rite image not attached, placement could not be confirmed due to current cardiac rhythm.       Scheduled Meds:  chlorhexidine  15 mL Mouth Rinse BID   Chlorhexidine Gluconate Cloth  6 each Topical Daily   enoxaparin (LOVENOX) injection  40 mg Subcutaneous Q24H   insulin aspart  0-15 Units Subcutaneous TID WC   insulin aspart  0-5 Units Subcutaneous QHS   insulin glargine-yfgn  11 Units Subcutaneous Daily   mouth rinse  15 mL Mouth Rinse q12n4p   metoCLOPramide (REGLAN) injection  10 mg Intravenous Q8H   metoprolol tartrate  5 mg Intravenous Q6H   pantoprazole  40 mg Oral Once   sodium chloride flush  10-40 mL Intracatheter Q12H   Continuous Infusions:  sodium chloride     potassium chloride 10 mEq (07/11/21 0624)     LOS: 1 day    Time spent: 35 minutes.     Elmarie Shiley, MD [facility]   If 7PM-7AM, please contact night-coverage [email or web address]  07/11/2021, 6:52 AM

## 2021-07-11 NOTE — Evaluation (Signed)
Physical Therapy Evaluation Patient Details Name: Lori [name] MRN: [id number] DOB: 1970/10/15 Today's Date: 07/11/2021  History of Present Illness  51 year old with past medical history significant for diabetes, history of CVA, CAD, depression who presented to the ED referred by her PCP office due to severe nausea vomiting and dizziness.  She was found to have high blood sugar and high blood pressure. Patient admitted for Hyperosmolar hyperglycemic syndrome  Clinical Impression  On eval, pt required Min assist +2 for safe mobility/equipment. After standing, HR jumped up to 145 bpm so deferred ambulation and assisted pt over to recliner instead. She required assistance to steady and maneuver RW and she also had some difficulty advancing her L LE. Discussed d/c plan-pt is agreeable to placement for rehab. Will plan to follow pt during this hospital stay.        Recommendations for follow up therapy are one component of a multi-disciplinary discharge planning process, led by the attending physician.  Recommendations may be updated based on patient status, additional functional criteria and insurance authorization.  Follow Up Recommendations Skilled nursing-short term rehab (<3 hours/day)    Assistance Recommended at Discharge Frequent or constant Supervision/Assistance  Patient can return home with the following  Assistance with cooking/housework;Assist for transportation;Help with stairs or ramp for entrance;Direct supervision/assist for medications management;Direct supervision/assist for financial management;A little help with walking and/or transfers    Equipment Recommendations None recommended by PT  Recommendations for Other Services       Functional Status Assessment Patient has had a recent decline in their functional status and demonstrates the ability to make significant improvements in function in a reasonable and predictable amount of time.     Precautions / Restrictions  Precautions Precautions: Fall Precaution Comments: left sided hemiparesis Restrictions Weight Bearing Restrictions: No      Mobility  Bed Mobility Overal bed mobility: Needs Assistance Bed Mobility: Supine to Sit     Supine to sit: Min assist, HOB elevated     General bed mobility comments: Increased time. Assist to to scoot to EOB. Cues for safety    Transfers Overall transfer level: Needs assistance Equipment used: Rolling walker (2 wheels) Transfers: Sit to/from Stand, Bed to chair/wheelchair/BSC Sit to Stand: Min assist   Step pivot transfers: Min assist, +2 safety/equipment       General transfer comment: Assist to rise, steady, control descent. Cues for safety, technique, hand placement. HR up to 145 bpm so deferred ambulation and assisted pt into recliner insteady. She had some difficulty advancing L LE.    Ambulation/Gait               General Gait Details: NT-HR up to 145 bpm with standing/pivot  Stairs            Wheelchair Mobility    Modified Rankin (Stroke Patients Only)       Balance Overall balance assessment: Needs assistance         Standing balance support: During functional activity, Reliant on assistive device for balance Standing balance-Leahy Scale: Poor                               Pertinent Vitals/Pain Pain Assessment Pain Assessment: Faces Pain Location: mouth Pain Descriptors / Indicators: Discomfort, Sore Pain Intervention(s): Monitored during session    Home Living Family/patient expects to be discharged to:: Private residence Living Arrangements: Spouse/significant other Available Help at Discharge: Family;Available PRN/intermittently Type of Home: [place]  Access: Stairs to enter   [employer] of Steps: 5   Home Layout: One level Home Equipment: Rollator (4 wheels);Hand held shower head;Hospital bed;Shower seat;BSC/3in1      Prior Function Prior Level of Function : Needs  assist             Mobility Comments: only gets up with PT/OT at home. pivots only ADLs Comments: needs assistance with all ADLs     Hand Dominance   Dominant Hand: Right    Extremity/Trunk Assessment   Upper Extremity Assessment Upper Extremity Assessment: Defer to OT evaluation RUE Deficits / Details: WFL ROM, grossly 4/5 strength RUE Sensation: WNL RUE Coordination: WNL LUE Deficits / Details: 3-/5 shoulder, 3-5/ elbow, 3-/5 wrist - receives Botox in LUE. 3rd digit contracted into flexion. Able to grasp walker with finger and thumb LUE Coordination: decreased fine motor;decreased gross motor    Lower Extremity Assessment Lower Extremity Assessment: LLE deficits/detail;RLE deficits/detail RLE Sensation: WNL RLE Coordination: WNL LLE Deficits / Details: able to weightbear LLE Coordination: decreased fine motor;decreased gross motor    Cervical / Trunk Assessment Cervical / Trunk Assessment: Normal  Communication   Communication: Expressive difficulties  Cognition Arousal/Alertness: Awake/alert Behavior During Therapy: WFL for tasks assessed/performed Overall Cognitive Status: Within Functional Limits for tasks assessed                                          General Comments      Exercises     Assessment/Plan    PT Assessment Patient needs continued PT services  PT Problem List Decreased strength;Decreased mobility;Decreased range of motion;Decreased activity tolerance;Decreased balance;Decreased knowledge of use of DME;Decreased coordination       PT Treatment Interventions DME instruction;Gait training;Therapeutic activities;Therapeutic exercise;Patient/family education;Balance training;Functional mobility training    PT Goals (Current goals can be found in the Care Plan section)  Acute Rehab PT Goals Patient Stated Goal: to go to rehab PT Goal Formulation: With patient Time For Goal Achievement: 07/25/21 Potential to Achieve Goals:  Fair    Frequency Min 2X/week     Co-evaluation               AM-PAC PT "6 Clicks" Mobility  Outcome Measure Help needed turning from your back to your side while in a flat bed without using bedrails?: A Little Help needed moving from lying on your back to sitting on the side of a flat bed without using bedrails?: A Little Help needed moving to and from a bed to a chair (including a wheelchair)?: A Little Help needed standing up from a chair using your arms (e.g., wheelchair or bedside chair)?: A Little Help needed to walk in hospital room?: A Lot Help needed climbing 3-5 steps with a railing? : Total 6 Click Score: 15    End of Session Equipment Utilized During Treatment: Gait belt Activity Tolerance: Patient tolerated treatment well Patient left: in chair;with call bell/phone within reach;with chair alarm set   PT Visit Diagnosis: History of falling (Z91.81);Hemiplegia and hemiparesis Hemiplegia - Right/Left: Left    Time: 8413-2440 PT Time Calculation (min) (ACUTE ONLY): 24 min   Charges:   PT Evaluation $PT Eval Moderate Complexity: 1 Mod        Doreatha Massed, PT Acute Rehabilitation  Office: [phone] Pager: [phone]

## 2021-07-11 NOTE — Progress Notes (Signed)
    OVERNIGHT PROGRESS REPORT  Notified by RN for patient inability to reliably ingest potassium PO tabs. Reorder of potassium as IV dosing, Reduced dosing as patient may have ingested a small amount of tab.   Majority of tabs were not ingested (as reported)   Gershon Cull MSNA MSN [place]

## 2021-07-11 NOTE — TOC Progression Note (Signed)
Transition of Care [facility]) - Progression Note    Patient Details  Name: Sahana Boyland MRN: [id number] Date of Birth: [birth year]  Transition of Care [facility]) CM/SW [place], LCSW Phone Number: 07/11/2021, 3:19 PM  Clinical Narrative:    CSW informed that this pt is currently receiving [facility] PT/OT/RN services through [facility]. ROC orders will need to be placed for this patient to continue these services at discharge.    Expected Discharge Plan: Home/Self Care Barriers to Discharge: Continued Medical Work up  Expected Discharge Plan and Services Expected Discharge Plan: Home/Self Care In-house Referral: Clinical Social Work     Living arrangements for the past 2 months: Single Family Home                 DME Arranged: N/A DME Agency: NA                   Social Determinants of Health (SDOH) Interventions    Readmission Risk Interventions    07/11/2021    1:22 PM 02/22/2021   12:36 PM  Readmission Risk Prevention Plan  Transportation Screening Complete Complete  PCP or Specialist Appt within 3-5 Days  Complete  HRI or [place]  Complete  Social Work Consult for [place] Planning/Counseling  Complete  Palliative Care Screening  Complete  Medication Review [employer]) Complete Complete  HRI or Home Care Consult Complete   SW Recovery Care/Counseling Consult Complete   Palliative Care Screening Not Applicable   Skilled Nursing Facility Complete

## 2021-07-11 NOTE — Evaluation (Signed)
Occupational Therapy Evaluation Patient Details Name: Lori [name] MRN: [id number] DOB: 05-22-1970 Today's Date: 07/11/2021   History of Present Illness 51 year old with past medical history significant for diabetes, history of CVA, CAD, depression who presented to the ED referred by her PCP office due to severe nausea vomiting and dizziness.  She was found to have high blood sugar and high blood pressure. Patient admitted for Hyperosmolar hyperglycemic syndrome   Clinical Impression   Mrs. Lori [name] is a 51 year old woman who presents with above medical history. At baseline she lives with her husband and has an aide 3 hrs a day. Her husband works and she doesn't stand/pivot without assistance. She requires assistance for all ADLs. She only gets up with OT/PT, due to safety concerns, otherwise ADLs are at bed level. Today patient presents near her baseline in regards to functional abilities - needs assistance for all ADLs and needs assist to transfer.  She is able to use her left hand on the walker but not for ADLs. Patient is limited at home with her ability to ambulate, transfer, and stay out of bed due to lack of assistance during the day. Her husband works and aide limited to 3 hours. Patient would benefit from increased PCA services and/or long term care placement. Recommend OT therapy in the next venue of care - either home or at facility as she could potentially improve participation in ADLs with compensatory strategies in a more normal setting in which her typical clothing and DME were in use. No acute OT care needs at this time.       Recommendations for follow up therapy are one component of a multi-disciplinary discharge planning process, led by the attending physician.  Recommendations may be updated based on patient status, additional functional criteria and insurance authorization.   Follow Up Recommendations  Other (comment) (SNF for LTC or return home with increased assistance  and continue [place] OT/PT)    Assistance Recommended at Discharge Frequent or constant Supervision/Assistance  Patient can return home with the following A lot of help with walking and/or transfers;A lot of help with bathing/dressing/bathroom;Assistance with cooking/housework;Help with stairs or ramp for entrance;Assist for transportation    Functional Status Assessment  Patient has had a recent decline in their functional status and/or demonstrates limited ability to make significant improvements in function in a reasonable and predictable amount of time  Equipment Recommendations  None recommended by OT    Recommendations for Other Services       Precautions / Restrictions Precautions Precautions: Fall Precaution Comments: left sided hemiparesis Restrictions Weight Bearing Restrictions: No      Mobility Bed Mobility Overal bed mobility: Needs Assistance Bed Mobility: Supine to Sit     Supine to sit: HOB elevated, Mod assist     General bed mobility comments: assist for trunk and scooting to edge of bed.    Transfers Overall transfer level: Needs assistance Equipment used: Rolling walker (2 wheels) Transfers: Sit to/from Stand, Bed to chair/wheelchair/BSC Sit to Stand: Min assist, From elevated surface     Step pivot transfers: Min assist, +2 safety/equipment     General transfer comment: Able to take steps to recliner with RW - HR up to 146 limiting mobility.      Balance Overall balance assessment: Needs assistance Sitting-balance support: No upper extremity supported, Feet supported Sitting balance-Leahy Scale: Fair     Standing balance support: During functional activity, Reliant on assistive device for balance Standing balance-Leahy Scale: Poor  ADL either performed or assessed with clinical judgement   ADL Overall ADL's : Needs assistance/impaired Eating/Feeding: Set up;Sitting   Grooming: Set up;Sitting;Wash/dry  hands;Wash/dry face;Oral care   Upper Body Bathing: Moderate assistance;Bed level   Lower Body Bathing: Total assistance;Bed level   Upper Body Dressing : Maximal assistance;Bed level   Lower Body Dressing: Total assistance;Bed level   Toilet Transfer: Minimal assistance;BSC/3in1;Rolling walker (2 wheels)   Toileting- Clothing Manipulation and Hygiene: Total assistance;Sit to/from stand Toileting - Clothing Manipulation Details (indicate cue type and reason): typically uses depends/diapers at home     Functional mobility during ADLs: Minimal assistance;Rolling walker (2 wheels) General ADL Comments: Min assist to take steps to recliner. HR up to 146 - limiting mobility.     Vision   Vision Assessment?: No apparent visual deficits     Perception     Praxis      Pertinent Vitals/Pain Pain Assessment Breathing: normal Negative Vocalization: none Facial Expression: smiling or inexpressive Body Language: relaxed Consolability: no need to console PAINAD Score: 0     Hand Dominance Right   Extremity/Trunk Assessment Upper Extremity Assessment Upper Extremity Assessment: Defer to OT evaluation RUE Deficits / Details: WFL ROM, grossly 4/5 strength RUE Sensation: WNL RUE Coordination: WNL LUE Deficits / Details: 3-/5 shoulder, 3-5/ elbow, 3-/5 wrist - receives Botox in LUE. 3rd digit contracted into flexion. Able to grasp walker with finger and thumb LUE Coordination: decreased fine motor;decreased gross motor   Lower Extremity Assessment Lower Extremity Assessment: LLE deficits/detail;RLE deficits/detail RLE Sensation: WNL RLE Coordination: WNL LLE Deficits / Details: able to weightbear LLE Coordination: decreased fine motor;decreased gross motor   Cervical / Trunk Assessment Cervical / Trunk Assessment: Normal   Communication Communication Communication: Expressive difficulties   Cognition Arousal/Alertness: Awake/alert Behavior During Therapy: WFL for tasks  assessed/performed Overall Cognitive Status: Within Functional Limits for tasks assessed                                                  Home Living Family/patient expects to be discharged to:: Private residence Living Arrangements: Spouse/significant other Available Help at Discharge: Family;Available PRN/intermittently Type of Home: Apartment Home Access: Stairs to enter Entrance Stairs-Number of Steps: 5   Home Layout: One level               Home Equipment: Rollator (4 wheels);Hand held shower head;Hospital bed;Shower seat;BSC/3in1          Prior Functioning/Environment Prior Level of Function : Needs assist             Mobility Comments: only gets up with PT/OT at home. pivots only ADLs Comments: needs assistance with all ADLs        OT Problem List: Decreased strength;Decreased activity tolerance;Impaired balance (sitting and/or standing);Decreased safety awareness;Decreased knowledge of use of DME or AE;Decreased knowledge of precautions;Impaired UE functional use;Obesity;Impaired tone;Cardiopulmonary status limiting activity;Decreased coordination;Decreased range of motion      OT Treatment/Interventions: Self-care/ADL training;Therapeutic exercise;Neuromuscular education;DME and/or AE instruction;Therapeutic activities;Balance training;Patient/family education    OT Goals(Current goals can be found in the care plan section) Acute Rehab OT Goals Patient Stated Goal: be safer OT Goal Formulation: With patient Time For Goal Achievement: 07/25/21 Potential to Achieve Goals: Fair  OT Frequency: Min 2X/week    Co-evaluation PT/OT/SLP Co-Evaluation/Treatment: Yes (co-eval)  AM-PAC OT "6 Clicks" Daily Activity     Outcome Measure Help from another person eating meals?: A Little Help from another person taking care of personal grooming?: A Little Help from another person toileting, which includes using toliet, bedpan, or  urinal?: Total Help from another person bathing (including washing, rinsing, drying)?: A Lot Help from another person to put on and taking off regular upper body clothing?: A Lot Help from another person to put on and taking off regular lower body clothing?: Total 6 Click Score: 12   End of Session Equipment Utilized During Treatment: Gait belt;Rolling walker (2 wheels) Nurse Communication: Mobility status  Activity Tolerance: Patient tolerated treatment well Patient left: in chair;with call bell/phone within reach;with nursing/sitter in room  OT Visit Diagnosis: Hemiplegia and hemiparesis Hemiplegia - Right/Left: Left Hemiplegia - dominant/non-dominant: Non-Dominant Hemiplegia - caused by: Cerebral infarction                Time: 6815-9470 OT Time Calculation (min): 19 min Charges:  OT General Charges $OT Visit: 1 Visit OT Evaluation $OT Eval Low Complexity: 1 Low  Mackinze Criado, OTR/L [place]  Office [phone] Pager: [phone]   Lenward Chancellor 07/11/2021, 2:29 PM

## 2021-07-12 DIAGNOSIS — E1100 Type 2 diabetes mellitus with hyperosmolarity without nonketotic hyperglycemic-hyperosmolar coma (NKHHC): Secondary | ICD-10-CM

## 2021-07-12 LAB — BASIC METABOLIC PANEL
Anion gap: 9 (ref 5–15)
BUN: 7 mg/dL (ref 6–20)
CO2: 26 mmol/L (ref 22–32)
Calcium: 7.6 mg/dL — ABNORMAL LOW (ref 8.9–10.3)
Chloride: 100 mmol/L (ref 98–111)
Creatinine, Ser: 0.49 mg/dL (ref 0.44–1.00)
GFR, Estimated: >60 mL/min (ref >60)
Glucose, Bld: 212 mg/dL — ABNORMAL HIGH (ref 70–99)
Potassium: 3.3 mmol/L — ABNORMAL LOW (ref 3.5–5.1)
Sodium: 135 mmol/L (ref 135–145)

## 2021-07-12 LAB — GLUCOSE, CAPILLARY
Glucose-Capillary: 220 mg/dL — ABNORMAL HIGH (ref 70–99)
Glucose-Capillary: 260 mg/dL — ABNORMAL HIGH (ref 70–99)
Glucose-Capillary: 310 mg/dL — ABNORMAL HIGH (ref 70–99)
Glucose-Capillary: 218 mg/dL — ABNORMAL HIGH (ref 70–99)

## 2021-07-12 LAB — URINE CULTURE: Culture: NO GROWTH

## 2021-07-12 LAB — MAGNESIUM: Magnesium: 1.7 mg/dL (ref 1.7–2.4)

## 2021-07-12 MED ORDER — ESCITALOPRAM OXALATE 20 MG PO TABS
20.0000 mg | ORAL_TABLET | Freq: Every day | ORAL | Status: DC
  Administered 2021-07-12 – 2021-07-18 (×7): 20 mg via ORAL
  Filled 2021-07-12 (×7): qty 1

## 2021-07-12 MED ORDER — INSULIN GLARGINE-YFGN 100 UNIT/ML ~~LOC~~ SOLN
20.0000 [IU] | Freq: Every day | SUBCUTANEOUS | Status: DC
  Administered 2021-07-12 – 2021-07-17 (×6): 20 [IU] via SUBCUTANEOUS
  Filled 2021-07-12 (×7): qty 0.2

## 2021-07-12 MED ORDER — ONDANSETRON HCL 4 MG/2ML IJ SOLN
4.0000 mg | Freq: Once | INTRAMUSCULAR | Status: AC
Start: 2021-07-12 — End: 2021-07-12
  Administered 2021-07-12: 4 mg via INTRAVENOUS
  Filled 2021-07-12: qty 2

## 2021-07-12 MED ORDER — PANTOPRAZOLE SODIUM 40 MG PO TBEC
40.0000 mg | DELAYED_RELEASE_TABLET | Freq: Every day | ORAL | Status: DC
  Administered 2021-07-12 – 2021-07-18 (×7): 40 mg via ORAL
  Filled 2021-07-12 (×7): qty 1

## 2021-07-12 MED ORDER — TOPIRAMATE 25 MG PO TABS
25.0000 mg | ORAL_TABLET | Freq: Two times a day (BID) | ORAL | Status: DC
  Administered 2021-07-12 – 2021-07-18 (×12): 25 mg via ORAL
  Filled 2021-07-12 (×12): qty 1

## 2021-07-12 NOTE — Progress Notes (Signed)
PROGRESS NOTE Lori [name]  IRC:[id number] DOB: 16-Sep-1970 DOA: 07/10/2021 PCP: [name], NP   Brief Narrative/Hospital Course: 51 year old with past medical history significant for diabetes, history of CVA, CAD, depression who presented to the ED referred by her PCP office due to severe nausea vomiting and dizziness.  She was found to have high blood sugar and high blood pressure.  She has not been able to take her Lantus in 24 hours.At baseline she has contractures and weakness in her left hemibody.  More pronounced in the upper extremity left.  She is not able to walk at baseline.  She lives at home and has nursing care for 3 hours a day.  Work-up with CT abdomen and pelvis is nonremarkable.  CT head no acute intracranial abnormality.  Remote left cerebellar infarct, chronic basal ganglia lacunar infarct and age advanced chronic small vessel ischemia. Patient admitted for HHS/gastroparesis/dehydration tachycardia/aspiration pneumonia mild pancreatitis and being managed with with IV fluids,insulin, Reglan.      Subjective: Seen and examined this morning.  Appears weak husband at the bedside difficulty coughing but subsequently able to interact, able to cough some.  Generalized weakness as well. 6/8-overnight mild tachycardia in low 100, Tmax 99.1, on room air BP 130s to 140s.  Labs with potassium 3.3 blood sugar running in 200 range, initial leukocytosis 22 K> 14.9 K.    Assessment and Plan: Principal Problem:   Hyperosmolar hyperglycemic state (HHS) ([place]) Active Problems:   Tachycardia   Anxiety and depression   Weakness   History of cerebral infarction   Pseudohyponatremia   Thyroid disease   HH DM on longterm insulin on Lantus 28 units, Jardiance, With uncontrolled hyperglycemia: Off insulin drip, increased mealtime insulin Semglee from 11units to 20 units, continue SSI 0-15 units, monitor and adjust insulin as oral intake picks up-on FLD nectar thick Recent Labs  Lab  07/11/21 1137 07/11/21 1619 07/11/21 2116 07/12/21 0755 07/12/21 1201  GLUCAP 201* 258* 249* 220* 260*   Pancreatitis mild supportive management diet as tolerated  Tachycardia in the setting of pancreatitis hyperglycemia, electrolyte home continue IV resume p.o. metoprolol slowly.  Aspiration pneumonia: chest x-ray with streaky perihilar and peribronchial opacities in bilateral lower lobes, with cough low-grade fever at risk of aspiration.  Continue ceftriaxone, speech swallow eval appreciated continue nectar thick liquid full liquid diet.  Chronic dysphagia speech following diet as above Hypothyroidism continue methimazole History of CVA continue Plavix and statin Generalized weakness/debility continue PT OT will need rehab and placement Leukocytosis/pyuria currently on IV antibiotics urine culture pending Hypomagnesemia/hypokalemia repleted, monitor PICC in place  DVT prophylaxis: enoxaparin (LOVENOX) injection 40 mg Start: 07/10/21 1800 SCDs Start: 07/10/21 1521 Code Status:   Code Status: Full Code Family Communication: plan of care discussed with patient/husband at bedside. Patient status is: inpatient  because of pending placement Level of care: Telemetry   Dispo: The patient is from: home            Anticipated disposition: SNF in  Mobility Assessment (last 72 hours)     Mobility Assessment     Row Name 07/11/21 2045 07/11/21 1900 07/11/21 1236 07/11/21 1234     Does patient have an order for bedrest or is patient medically unstable No - Continue assessment No - Continue assessment -- --    What is the highest level of mobility based on the progressive mobility assessment? Level 3 (Stands with assist) - Balance while standing  and cannot march in place Level 3 (Stands with assist) -  Balance while standing  and cannot march in place Level 3 (Stands with assist) - Balance while standing  and cannot march in place Level 3 (Stands with assist) - Balance while standing  and  cannot march in place    Is the above level different from baseline mobility prior to current illness? Yes - Recommend PT order Yes - Recommend PT order -- --              Objective: Vitals last 24 hrs: Vitals:   07/11/21 1514 07/11/21 1933 07/12/21 0413 07/12/21 1258  BP: (!) 143/102 (!) 135/95 (!) 144/98 (!) 158/99  Pulse: (!) 102 (!) 108 (!) 106 (!) 110  Resp: '18 18 20 18  '$ Temp: 97.6 F (36.4 C) 98.9 F (37.2 C) 99.1 F (37.3 C)   TempSrc: Oral Oral Oral   SpO2: 99% 98% 97% 98%   Weight change:   Physical Examination: General exam: alert awake, very weak and deconditioned older than stated age, weak appearing. HEENT:Oral mucosa moist, Ear/Nose WNL grossly, dentition normal. Respiratory system: bilaterally diminished BS, no use of accessory muscle Cardiovascular system: S1 & S2 +, No JVD. Gastrointestinal system: Abdomen soft,NT,ND, BS+ Nervous System:Alert, awake, moving extremities and grossly nonfocal Extremities: LE edema MILD,distal peripheral pulses palpable.  Skin: No rashes,no icterus. MSK: Normal muscle bulk,tone, power  Medications reviewed:  Scheduled Meds:  Chlorhexidine Gluconate Cloth  6 each Topical Daily   clopidogrel  75 mg Oral Daily   enoxaparin (LOVENOX) injection  40 mg Subcutaneous Q24H   ezetimibe  10 mg Oral Daily   feeding supplement (GLUCERNA SHAKE)  237 mL Oral TID BM   insulin aspart  0-15 Units Subcutaneous TID WC   insulin aspart  0-5 Units Subcutaneous QHS   insulin glargine-yfgn  20 Units Subcutaneous QHS   LORazepam  0.5 mg Oral QHS   magic mouthwash  5 mL Oral TID   mouth rinse  15 mL Mouth Rinse BID   methimazole  5 mg Oral Daily   metoCLOPramide (REGLAN) injection  10 mg Intravenous Q8H   metoprolol tartrate  5 mg Intravenous Q6H   nystatin   Topical TID   rosuvastatin  40 mg Oral Daily   sodium chloride flush  10-40 mL Intracatheter Q12H   Continuous Infusions:  sodium chloride 75 mL/hr at 07/12/21 0517   cefTRIAXone  (ROCEPHIN)  IV 2 g (07/12/21 0830)      Diet Order             Diet full liquid Room service appropriate? Yes; Fluid consistency: Nectar Thick  Diet effective now                    Nutrition Problem: Increased nutrient needs Etiology: acute illness Signs/Symptoms: estimated needs Interventions: Glucerna shake, MVI, Hormel Shake   Intake/Output Summary (Last 24 hours) at 07/12/2021 1331 Last data filed at 07/12/2021 0400 Gross per 24 hour  Intake 1503.66 ml  Output 1000 ml  Net 503.66 ml   Net IO Since Admission: 4,781.99 mL [07/12/21 1331]  Wt Readings from Last 3 Encounters:  07/10/21 92.5 kg  07/03/21 92.6 kg  06/25/21 92.6 kg     Unresulted Labs (From admission, onward)     Start     Ordered   07/17/21 0500  Creatinine, serum  (enoxaparin (LOVENOX)    CrCl >/= 30 ml/min)  Weekly,   R     Comments: while on enoxaparin therapy    07/10/21 1522   07/13/21 0500  CBC  Tomorrow morning,   R       Question:  Specimen collection method  Answer:  IV Team=IV Team collect   07/12/21 1331   07/11/21 0830  Urine Culture  Once,   R       Question:  Indication  Answer:  Dysuria   07/11/21 0829   07/11/21 8921  Basic metabolic panel  Daily,   R     Question:  Specimen collection method  Answer:  Unit=Unit collect   07/10/21 2333          Data Reviewed: I have personally reviewed following labs and imaging studies CBC: Recent Labs  Lab 07/10/21 1222 07/10/21 1233 07/11/21 1230  WBC  --  22.8* 14.9*  NEUTROABS  --  19.9*  --   HGB 16.7* 14.5 11.0*  HCT 49.0* 43.0 32.7*  MCV  --  77.3* 77.7*  PLT  --  364 194   Basic Metabolic Panel: Recent Labs  Lab 07/10/21 1946 07/11/21 0000 07/11/21 0300 07/11/21 1230 07/12/21 0332  NA 137 133* 135 134* 135  K 3.0* 2.8* 3.1* 3.6 3.3*  CL 101 99 99 100 100  CO2 '27 26 26 25 26  '$ GLUCOSE 90 162* 155* 250* 212*  BUN '14 13 12 9 7  '$ CREATININE 0.67 0.61 0.54 0.59 0.49  CALCIUM 8.1* 7.7* 8.0* 8.1* 7.6*  MG  --   --  1.4*   --  1.7   GFR: Estimated Creatinine Clearance: 91.1 mL/min (by C-G formula based on SCr of 0.49 mg/dL). Liver Function Tests: Recent Labs  Lab 07/10/21 1233  AST 13*  ALT 14  ALKPHOS 116  BILITOT 1.2  PROT 8.0  ALBUMIN 3.5   Recent Labs  Lab 07/10/21 1233 07/11/21 1230  LIPASE 110* 42   No results for input(s): "AMMONIA" in the last 168 hours. Coagulation Profile: No results for input(s): "INR", "PROTIME" in the last 168 hours. BNP (last 3 results) No results for input(s): "PROBNP" in the last 8760 hours. HbA1C: No results for input(s): "HGBA1C" in the last 72 hours. CBG: Recent Labs  Lab 07/11/21 1137 07/11/21 1619 07/11/21 2116 07/12/21 0755 07/12/21 1201  GLUCAP 201* 258* 249* 220* 260*   Lipid Profile: No results for input(s): "CHOL", "HDL", "LDLCALC", "TRIG", "CHOLHDL", "LDLDIRECT" in the last 72 hours. Thyroid Function Tests: No results for input(s): "TSH", "T4TOTAL", "FREET4", "T3FREE", "THYROIDAB" in the last 72 hours. Sepsis Labs: No results for input(s): "PROCALCITON", "LATICACIDVEN" in the last 168 hours.  Recent Results (from the past 240 hour(s))  MRSA Next Gen by PCR, Nasal     Status: None   Collection Time: 07/10/21  7:55 PM   Specimen: Nasal Mucosa; Nasal Swab  Result Value Ref Range Status   MRSA by PCR Next Gen NOT DETECTED NOT DETECTED Final    Comment: (NOTE) The GeneXpert MRSA Assay (FDA approved for NASAL specimens only), is one component of a comprehensive MRSA colonization surveillance program. It is not intended to diagnose MRSA infection nor to guide or monitor treatment for MRSA infections. Test performance is not FDA approved in patients less than 35 years old. Performed at [facility], [place] [address] [place], [place] [address]     Antimicrobials: Anti-infectives (From admission, onward)    Start     Dose/Rate Route Frequency Ordered Stop   07/11/21 1000  cefTRIAXone (ROCEPHIN) 2 g in sodium chloride  0.9 % 100 mL IVPB        2 g 200  mL/hr over 30 Minutes Intravenous Every 24 hours 07/11/21 0831        Culture/Microbiology    Component Value Date/Time   SDES IN/OUT CATH URINE 03/09/2021 0843   SPECREQUEST NONE 03/09/2021 0843   CULT  03/09/2021 0843    NO GROWTH Performed at [facility], [place] [address], [place], [place] [address]    REPTSTATUS 03/10/2021 FINAL 03/09/2021 7741    Other culture-see note  Radiology Studies: CT Head Wo Contrast  Result Date: 07/10/2021 CLINICAL DATA:  Altered mental status. Mental status change of unknown cause. Headache and vomiting. Chronic left-sided weakness. EXAM: CT HEAD WITHOUT CONTRAST TECHNIQUE: Contiguous axial images were obtained from the base of the skull through the vertex without intravenous contrast. RADIATION DOSE REDUCTION: This exam was performed according to the departmental dose-optimization program which includes automated exposure control, adjustment of the mA and/or kV according to patient size and/or use of iterative reconstruction technique. COMPARISON:  Head CT and brain MRI 06/11/2021 FINDINGS: Brain: No acute intracranial hemorrhage. There is no evidence of acute ischemia. Remote lacunar infarcts within bilateral basal ganglia and remote left cerebellar infarct, unchanged. No hydrocephalus. Periventricular and deep white matter hypodensity typical of chronic small vessel ischemia, stable. Vascular: Atherosclerosis of skullbase vasculature without hyperdense vessel or abnormal calcification. Skull: No fracture or focal lesion. Sinuses/Orbits: Paranasal sinuses and mastoid air cells are clear. The visualized orbits are unremarkable. Other: None. IMPRESSION: 1. No acute intracranial abnormality. 2. Remote left cerebellar infarct, chronic basal gangliar lacunar infarcts and age advanced chronic small vessel ischemia. Electronically Signed   By: Keith Rake M.D.   On: 07/10/2021 16:18   CT ABDOMEN PELVIS WO CONTRAST  Result  Date: 07/10/2021 CLINICAL DATA:  Nausea and vomiting since Friday. EXAM: CT ABDOMEN AND PELVIS WITHOUT CONTRAST TECHNIQUE: Multidetector CT imaging of the abdomen and pelvis was performed following the standard protocol without IV contrast. RADIATION DOSE REDUCTION: This exam was performed according to the departmental dose-optimization program which includes automated exposure control, adjustment of the mA and/or kV according to patient size and/or use of iterative reconstruction technique. COMPARISON:  Jun 11, 2021 CT abdomen pelvis FINDINGS: Lower chest: Bibasilar atelectasis versus scarring. Prior median sternotomy and CABG. Hepatobiliary: Unremarkable noncontrast appearance of the hepatic parenchyma. Gallbladder is unremarkable. No biliary ductal dilation. Pancreas: Mild stranding along the pancreatic head may reflect pancreatitis recommend correlation with serum lipase. No pancreatic ductal dilation. Spleen: No splenomegaly. 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 Mild thickening of the left adrenal gland without discrete nodularity is similar prior. No hydronephrosis. No renal, ureteral or bladder calculi. Urinary bladder is unremarkable for degree of distension. Stomach/Bowel: No radiopaque enteric contrast material was administered. Small hiatal hernia otherwise the stomach is unremarkable for degree of distension. No pathologic dilation of small or large bowel. Terminal ileum appears normal. The appendix is not confidently identified however there is no pericecal inflammation. Evidence of acute bowel inflammation. Vascular/Lymphatic: Extensive aortic and branch vessel atherosclerosis without abdominal aortic aneurysm. No pathologically enlarged abdominal or pelvic lymph nodes. Reproductive: Status post hysterectomy. No adnexal masses. Other: No significant abdominopelvic free fluid. No pneumoperitoneum. Similar size of the heterogeneous soft tissue nodularity in the anterior  abdominal wall which measures 3.3 x 2.0 cm on image 84/4 previously 3.4 x 2.2 cm, and appears to be at the level of patient's known C-section scar. Musculoskeletal: No acute osseous abnormality. IMPRESSION: 1. No acute abnormality in the abdomen or pelvis on this noncontrast CT, specifically no evidence of  bowel obstruction or acute bowel inflammation. 2. No significant interval change 3.3 cm heterogeneous soft tissue density within the deep subcutaneous soft tissues of the lower anterior abdominal wall potentially related to prior surgery. 3. Prior right adrenalectomy. Electronically Signed   By: Dahlia Bailiff M.D.   On: 07/10/2021 14:35   [place] EKG SITE RITE  Result Date: 07/10/2021 If Site Rite image not attached, placement could not be confirmed due to current cardiac rhythm.    LOS: 2 days   Antonieta Pert, MD [facility]  07/12/2021, 1:31 PM

## 2021-07-12 NOTE — Plan of Care (Signed)

## 2021-07-12 NOTE — NC FL2 (Signed)
[place] LEVEL OF CARE SCREENING TOOL     IDENTIFICATION  Patient Name: Lori [name] Birthdate: 1970/07/06 Sex: female Admission Date (Current Location): 07/10/2021  [place] and [place] Number:  Kathleen Argue [phone] [place] and Address:  [facility],  [place] [address] [place]      Provider Number: [id number]  Attending Physician Name and Address:  Antonieta Pert, MD  Relative Name and Phone Number:  Daughter, [place] Callas [phone])    Current Level of Care: Hospital Recommended Level of Care: [place] Prior Approval Number:    Date Approved/Denied:   PASRR Number: [id number]  Discharge Plan: SNF    Current Diagnoses: Patient Active Problem List   Diagnosis Date Noted   Hyperosmolar hyperglycemic state (HHS) ([place]) 07/10/2021   Pseudohyponatremia 07/10/2021   Thyroid disease 07/10/2021   Functional urinary incontinence 05/17/2021   MDD (major depressive disorder), recurrent episode, severe ([place]) 04/09/2021   Diabetic gastroparesis ([place]) 03/16/2021   Migraines 03/16/2021   History of cerebral infarction    Debility 02/26/2021   DKA (diabetic ketoacidosis) ([place]) 02/19/2021   Constipation    Weakness    Type 2 diabetes mellitus with hyperlipidemia ([place])    Aphasia    Spell of abnormal behavior    Stroke ([place]) 01/19/2021   Tachycardia 01/19/2021   CAD (coronary artery disease) 01/19/2021   CVA (cerebral vascular accident) ([place]) 01/19/2021   Diabetes ([place])    Hyperthyroidism    Essential hypertension    Pheochromocytoma    Anxiety and depression    HLD (hyperlipidemia)     Orientation RESPIRATION BLADDER Height & Weight     Self, Time, Situation, Place  Normal Incontinent, External catheter Weight:   Height:     BEHAVIORAL SYMPTOMS/MOOD NEUROLOGICAL BOWEL NUTRITION STATUS      Continent    AMBULATORY STATUS COMMUNICATION OF NEEDS Skin   Extensive Assist Verbally Normal                        Personal Care Assistance Level of Assistance  Bathing, Feeding, Dressing, Total care Bathing Assistance: Maximum assistance Feeding assistance: Limited assistance Dressing Assistance: Maximum assistance Total Care Assistance: Maximum assistance   Functional Limitations Info  Sight, Hearing, Speech Sight Info: Impaired Hearing Info: Adequate Speech Info: Adequate    SPECIAL CARE FACTORS FREQUENCY  PT (By licensed PT), OT (By licensed OT)     PT Frequency: 5x/wk OT Frequency: 5x/wk            Contractures      Additional Factors Info  Code Status, Allergies, Psychotropic Code Status Info: Full Allergies Info: Sumatriptan, Contrast Media (Iodinated Contrast Media), Penicillins Psychotropic Info: See MAR         Current Medications (07/12/2021):  This is the current hospital active medication list Current Facility-Administered Medications  Medication Dose Route Frequency Provider Last Rate Last Admin   0.9 %  sodium chloride infusion   Intravenous Continuous Regalado, Belkys A, MD 75 mL/hr at 07/12/21 0517 New Bag at 07/12/21 0517   cefTRIAXone (ROCEPHIN) 2 g in sodium chloride 0.9 % 100 mL IVPB  2 g Intravenous Q24H Regalado, Belkys A, MD 200 mL/hr at 07/12/21 0830 2 g at 07/12/21 0830   Chlorhexidine Gluconate Cloth 2 % PADS 6 each  6 each Topical Daily Regalado, Belkys A, MD   6 each at 07/12/21 0830   clopidogrel (PLAVIX) tablet 75 mg  75 mg Oral Daily Regalado, Belkys A, MD  75 mg at 07/12/21 0812   dextrose 50 % solution 0-50 mL  0-50 mL Intravenous PRN Regalado, Belkys A, MD       enoxaparin (LOVENOX) injection 40 mg  40 mg Subcutaneous Q24H Regalado, Belkys A, MD   40 mg at 07/11/21 1731   escitalopram (LEXAPRO) tablet 20 mg  20 mg Oral Daily Kc, Ramesh, MD       ezetimibe (ZETIA) tablet 10 mg  10 mg Oral Daily Regalado, Belkys A, MD   10 mg at 07/12/21 0812   feeding supplement (GLUCERNA SHAKE) (GLUCERNA SHAKE) liquid 237 mL  237 mL Oral TID BM Regalado, Belkys  A, MD   237 mL at 07/12/21 0830   HYDROmorphone (DILAUDID) injection 0.5 mg  0.5 mg Intravenous Q4H PRN Regalado, Belkys A, MD   0.5 mg at 07/12/21 0810   insulin aspart (novoLOG) injection 0-15 Units  0-15 Units Subcutaneous TID WC Regalado, Belkys A, MD   8 Units at 07/12/21 1211   insulin aspart (novoLOG) injection 0-5 Units  0-5 Units Subcutaneous QHS Regalado, Belkys A, MD   2 Units at 07/11/21 2200   insulin glargine-yfgn (SEMGLEE) injection 20 Units  20 Units Subcutaneous QHS Kc, Ramesh, MD       lip balm (CARMEX) ointment   Topical PRN Regalado, Belkys A, MD   75 application. at 07/12/21 0624   LORazepam (ATIVAN) tablet 0.5 mg  0.5 mg Oral QHS Regalado, Belkys A, MD   0.5 mg at 07/11/21 2103   magic mouthwash  5 mL Oral TID Regalado, Belkys A, MD   5 mL at 07/12/21 0812   MEDLINE mouth rinse  15 mL Mouth Rinse BID Regalado, Belkys A, MD   15 mL at 07/12/21 0813   melatonin tablet 10 mg  10 mg Oral QHS PRN Sharion Settler, NP   10 mg at 07/12/21 0008   methimazole (TAPAZOLE) tablet 5 mg  5 mg Oral Daily Regalado, Belkys A, MD   5 mg at 07/12/21 0812   metoCLOPramide (REGLAN) injection 10 mg  10 mg Intravenous Q8H Regalado, Belkys A, MD   10 mg at 07/12/21 1255   metoprolol tartrate (LOPRESSOR) injection 5 mg  5 mg Intravenous Q6H Regalado, Belkys A, MD   5 mg at 07/12/21 1255   nystatin (MYCOSTATIN/NYSTOP) topical powder   Topical TID Regalado, Belkys A, MD   Given at 07/12/21 0813   ondansetron (ZOFRAN) injection 4 mg  4 mg Intravenous Q6H PRN Regalado, Belkys A, MD       pantoprazole (PROTONIX) EC tablet 40 mg  40 mg Oral Daily Kc, Ramesh, MD       phenol (CHLORASEPTIC) mouth spray 1 spray  1 spray Mouth/Throat PRN Regalado, Belkys A, MD       rosuvastatin (CRESTOR) tablet 40 mg  40 mg Oral Daily Regalado, Belkys A, MD   40 mg at 07/12/21 0812   sodium chloride flush (NS) 0.9 % injection 10-40 mL  10-40 mL Intracatheter Q12H Regalado, Belkys A, MD   10 mL at 07/12/21 0813   sodium  chloride flush (NS) 0.9 % injection 10-40 mL  10-40 mL Intracatheter PRN Regalado, Belkys A, MD       topiramate (TOPAMAX) tablet 25 mg  25 mg Oral BID Kc, Maren [place], MD         Discharge Medications: Please see discharge summary for a list of discharge medications.  Relevant Imaging Results:  Relevant Lab Results:   Additional Information [phone]  Vassie Moselle,  LCSW

## 2021-07-12 NOTE — Progress Notes (Signed)
Assessment findings remain unchanged from 7am-3pm shift. Ivan Anchors, RN 07/12/21 5:19 PM

## 2021-07-12 NOTE — Progress Notes (Signed)
SLP Cancellation Note  Patient Details Name: Lori [name] MRN: [id number] DOB: 11-08-1970   Cancelled treatment:       Reason Eval/Treat Not Completed: Other (comment) (2nd attempt to see pt, first attempt pt was sleeping, 2nd attempt she was receiving pt care)  RN reports pt is not coughing with po today, continues with neck extension.   Kathleen Lime, MS [facility] SLP Acute Rehab Services Office [phone] Pager [phone]    Macario Golds 07/12/2021, 5:54 PM

## 2021-07-12 NOTE — TOC Progression Note (Addendum)
Transition of Care [facility]) - Progression Note    Patient Details  Name: Lori [name] MRN: [id number] Date of Birth: 1970-11-16  Transition of Care [place]) CM/SW [place], LCSW Phone Number: 07/12/2021, 2:08 PM  Clinical Narrative:    Met with pt and discussed SNF placement vs returning home with [facility] services. Pt is agreeable to SNF placement and verbally understands that she will have to sign her check over to SNF facility and will have to agree to stay for 30 days as Medicaid does not cove SNF placement. Pt states she is open to being referred to any facility other than [facility].  CSW has referred pt out for SNF placement and is currently awaiting bed offers.   1545: Pt has accepted bed offer for [facility] for SNF placement.   1600: Pt's bed offer was rescinded due to Medicaid only. CSW will continue to seek appropriate placement for this pt.    Expected Discharge Plan: Home/Self Care Barriers to Discharge: Continued Medical Work up  Expected Discharge Plan and Services Expected Discharge Plan: Home/Self Care In-house Referral: Clinical Social Work     Living arrangements for the past 2 months: Single Family Home                 DME Arranged: N/A DME Agency: NA                   Social Determinants of Health (SDOH) Interventions    Readmission Risk Interventions    07/11/2021    1:22 PM 02/22/2021   12:36 PM  Readmission Risk Prevention Plan  Transportation Screening Complete Complete  PCP or Specialist Appt within 3-5 Days  Complete  HRI or [place]  Complete  Social Work Consult for [place] Planning/Counseling  Complete  Palliative Care Screening  Complete  Medication Review Press photographer) Complete Complete  HRI or Home Care Consult Complete   SW Recovery Care/Counseling Consult Complete   Palliative Care Screening Not Applicable   Skilled Nursing Facility Complete

## 2021-07-12 NOTE — Hospital Course (Addendum)
51 year old with past medical history significant for diabetes, history of CVA, CAD, depression who presented to the ED referred by her PCP office due to severe nausea vomiting and dizziness.  She was found to have high blood sugar and high blood pressure.  She has not been able to take her Lantus in 24 hours.At baseline she has contractures and weakness in her left hemibody.  More pronounced in the upper extremity left.  She is not able to walk at baseline.  She lives at home and has nursing care for 3 hours a day.  Work-up with CT abdomen and pelvis is nonremarkable.  CT head no acute intracranial abnormality.  Remote left cerebellar infarct, chronic basal ganglia lacunar infarct and age advanced chronic small vessel ischemia.Patient admitted for HHS/gastroparesis/dehydration tachycardia/aspiration pneumonia mild pancreatitis and being managed with with IV fluids,insulin, Reglan.  Overall patient has been clinically improving remains deconditioned weak PT OT recommending skilled nursing facility placement.  Speech therapy  working closely.

## 2021-07-12 NOTE — Progress Notes (Signed)
Initial Nutrition Assessment  DOCUMENTATION CODES:   Obesity unspecified  INTERVENTION:   -Glucerna Shake po TID, each supplement provides 220 kcal and 10 grams of protein   -Hormel Shake BID with meals, providing 520 kcals and 22g protein.   NUTRITION DIAGNOSIS:   Increased nutrient needs related to acute illness as evidenced by estimated needs.  GOAL:   Patient will meet greater than or equal to 90% of their needs  MONITOR:   PO intake, Supplement acceptance, Labs, Weight trends, I & O's  REASON FOR ASSESSMENT:   Malnutrition Screening Tool    ASSESSMENT:   51 year old with past medical history significant for diabetes, history of CVA, CAD, depression who presented to the ED referred by her PCP office due to severe nausea vomiting and dizziness.  Patient in room, history of CVA, difficult to understand at times. Pt states she has been doing well with her liquid diet but would like it upgraded. States she was eating "everything" PTA. Pt states she is supposed to be getting 2 yogurts with meals but states she hasn't been getting 2. Pt likes blueberry but with her current restrictions pt can't order this at this time.  She is agreeable to trying Hormel shakes with meals. States the Glucerna shakes are okay.  Per weight records, pt has lost 21 lbs since 1/21 (9% wt loss x 4.5 months, significant for time frame).  Medications: Magic Mouthwash, Reglan  Labs reviewed:  CBGs: 220-260 Low K  NUTRITION - FOCUSED PHYSICAL EXAM:  No depletions noted.  Diet Order:   Diet Order             Diet full liquid Room service appropriate? Yes; Fluid consistency: Nectar Thick  Diet effective now                   EDUCATION NEEDS:   No education needs have been identified at this time  Skin:  Skin Assessment: Reviewed RN Assessment  Last BM:  PTA  Height:   Ht Readings from Last 1 Encounters:  07/10/21 '5\' 2"'$  (1.575 m)    Weight:   Wt Readings from Last 1  Encounters:  07/10/21 92.5 kg    BMI:  37 kg/m^2  Estimated Nutritional Needs:   Kcal:  1500-1700  Protein:  75-90g  Fluid:  1.7L/day  Clayton Bibles, MS, RD, LDN Inpatient Clinical Dietitian Contact information available via Amion

## 2021-07-13 DIAGNOSIS — E1100 Type 2 diabetes mellitus with hyperosmolarity without nonketotic hyperglycemic-hyperosmolar coma (NKHHC): Secondary | ICD-10-CM | POA: Diagnosis not present

## 2021-07-13 LAB — BASIC METABOLIC PANEL
Anion gap: 7 (ref 5–15)
BUN: 6 mg/dL (ref 6–20)
CO2: 26 mmol/L (ref 22–32)
Calcium: 8 mg/dL — ABNORMAL LOW (ref 8.9–10.3)
Chloride: 103 mmol/L (ref 98–111)
Creatinine, Ser: 0.55 mg/dL (ref 0.44–1.00)
GFR, Estimated: >60 mL/min (ref >60)
Glucose, Bld: 127 mg/dL — ABNORMAL HIGH (ref 70–99)
Potassium: 3.2 mmol/L — ABNORMAL LOW (ref 3.5–5.1)
Sodium: 136 mmol/L (ref 135–145)

## 2021-07-13 LAB — CBC
HCT: 30 % — ABNORMAL LOW (ref 36.0–46.0)
Hemoglobin: 10 g/dL — ABNORMAL LOW (ref 12.0–15.0)
MCH: 25.8 pg — ABNORMAL LOW (ref 26.0–34.0)
MCHC: 33.3 g/dL (ref 30.0–36.0)
MCV: 77.3 fL — ABNORMAL LOW (ref 80.0–100.0)
Platelets: 279 10*3/uL (ref 150–400)
RBC: 3.88 MIL/uL (ref 3.87–5.11)
RDW: 15.4 % (ref 11.5–15.5)
WBC: 10.4 10*3/uL (ref 4.0–10.5)
nRBC: 0 % (ref 0.0–0.2)

## 2021-07-13 LAB — GLUCOSE, CAPILLARY
Glucose-Capillary: 138 mg/dL — ABNORMAL HIGH (ref 70–99)
Glucose-Capillary: 222 mg/dL — ABNORMAL HIGH (ref 70–99)
Glucose-Capillary: 317 mg/dL — ABNORMAL HIGH (ref 70–99)
Glucose-Capillary: 239 mg/dL — ABNORMAL HIGH (ref 70–99)

## 2021-07-13 MED ORDER — POTASSIUM CHLORIDE CRYS ER 20 MEQ PO TBCR
30.0000 meq | EXTENDED_RELEASE_TABLET | ORAL | Status: AC
  Administered 2021-07-13 (×2): 30 meq via ORAL
  Filled 2021-07-13 (×2): qty 1

## 2021-07-13 MED ORDER — METOPROLOL TARTRATE 50 MG PO TABS
50.0000 mg | ORAL_TABLET | Freq: Two times a day (BID) | ORAL | Status: DC
  Administered 2021-07-13 – 2021-07-18 (×11): 50 mg via ORAL
  Filled 2021-07-13 (×14): qty 1

## 2021-07-13 MED ORDER — INSULIN ASPART 100 UNIT/ML IJ SOLN
2.0000 [IU] | Freq: Three times a day (TID) | INTRAMUSCULAR | Status: DC
Start: 2021-07-13 — End: 2021-07-18
  Administered 2021-07-13 – 2021-07-18 (×11): 2 [IU] via SUBCUTANEOUS

## 2021-07-13 NOTE — Progress Notes (Signed)
PROGRESS NOTE Lori [name]  JOI:[id number] DOB: Apr 05, 1970 DOA: 07/10/2021 PCP: [name], NP   Brief Narrative/Hospital Course: 51 year old with past medical history significant for diabetes, history of CVA, CAD, depression who presented to the ED referred by her PCP office due to severe nausea vomiting and dizziness.  She was found to have high blood sugar and high blood pressure.  She has not been able to take her Lantus in 24 hours.At baseline she has contractures and weakness in her left hemibody.  More pronounced in the upper extremity left.  She is not able to walk at baseline.  She lives at home and has nursing care for 3 hours a day.  Work-up with CT abdomen and pelvis is nonremarkable.  CT head no acute intracranial abnormality.  Remote left cerebellar infarct, chronic basal ganglia lacunar infarct and age advanced chronic small vessel ischemia.Patient admitted for HHS/gastroparesis/dehydration tachycardia/aspiration pneumonia mild pancreatitis and being managed with with IV fluids,insulin, Reglan.  Overall patient has been clinically improving remains deconditioned weak PT OT recommending skilled nursing facility placement.  Speech therapy  working closely.    Subjective: Seen and examined. Remains very weak and deconditioned wanting to eat something solid remains on full liquid with nectar thick diet    Assessment and Plan: Principal Problem:   Hyperosmolar hyperglycemic state (HHS) ([place]) Active Problems:   Tachycardia   Anxiety and depression   Weakness   History of cerebral infarction   Pseudohyponatremia   Thyroid disease   Hyperosmolar hyperglycemic state DM on longterm insulin on Lantus 28 units, Jardiance, With uncontrolled hyperglycemia: Off insulin drip blood sugar now fairly improving continue current Semglee 20 units, continue SSI 0-15 units, monitor and adjust insulin as oral intake picks up-on FLD nectar thick- slp to re-eval to advance diet today Recent  Labs  Lab 07/12/21 1201 07/12/21 1713 07/12/21 2103 07/13/21 0714 07/13/21 1152  GLUCAP 260* 310* 218* 138* 222*   Pancreatitis mild supportive management.  Diet as tolerated.  Improving  Tachycardia in the setting of pancreatitis hyperglycemia, electrolyte imbalance.  Much stable resume home metoprolol at 50 twice daily normally takes 100 twice daily, DC scheduled IV metoprolol  Aspiration pneumonia: chest x-ray with streaky perihilar and peribronchial opacities in bilateral lower lobes, with cough low-grade fever at risk of aspiration.  Continue ceftriaxone, speech swallow eval appreciated continue nectar thick liquid full liquid diet-speech to reeval..  Chronic dysphagia speech following diet as above.  Continue aspiration precaution Hyperthyroidism continue methimazole History of CVA continue Plavix and statin Generalized weakness/debility continue PT OT will need rehab and placement Leukocytosis/pyuria currently on IV antibiotics urine culture NGTD Hypomagnesemia/hypokalemia recheck BMP in AM.  Repleted this morning with potassium chloride.   PICC in place  DVT prophylaxis: enoxaparin (LOVENOX) injection 40 mg Start: 07/10/21 1800 SCDs Start: 07/10/21 1521 Code Status:   Code Status: Full Code Family Communication: plan of care discussed with patient/husband at bedside. Patient status is: inpatient  because of pending placement Level of care: Telemetry   Dispo: The patient is from: home            Anticipated disposition: SNF once available will be difficult placement given Medicaid insert  Mobility Assessment (last 72 hours)     Mobility Assessment     Row Name 07/11/21 2045 07/11/21 1900 07/11/21 1236 07/11/21 1234     Does patient have an order for bedrest or is patient medically unstable No - Continue assessment No - Continue assessment -- --    What  is the highest level of mobility based on the progressive mobility assessment? Level 3 (Stands with assist) - Balance  while standing  and cannot march in place Level 3 (Stands with assist) - Balance while standing  and cannot march in place Level 3 (Stands with assist) - Balance while standing  and cannot march in place Level 3 (Stands with assist) - Balance while standing  and cannot march in place    Is the above level different from baseline mobility prior to current illness? Yes - Recommend PT order Yes - Recommend PT order -- --              Objective: Vitals last 24 hrs: Vitals:   07/13/21 0013 07/13/21 0304 07/13/21 0553 07/13/21 1232  BP: (!) 156/94 (!) 155/98 (!) 154/99 (!) 157/102  Pulse: (!) 108 95 93 (!) 102  Resp:    18  Temp:  98 F (36.7 C)  98 F (36.7 C)  TempSrc:  Axillary    SpO2: 97% 97%  99%   Weight change:   Physical Examination: General exam: AA, ill-appearing, obese, older than stated age, weak appearing. HEENT:Oral mucosa moist, Ear/Nose WNL grossly, dentition normal. Respiratory system: bilaterally diminished, no use of accessory muscle Cardiovascular system: S1 & S2 +, No JVD,. Gastrointestinal system: Abdomen soft,NT,ND,BS+ Nervous System:Alert, awake, generalized weakness bilateral lower extremities Extremities: LE ankle edema bilaterally, distal peripheral pulses palpable.  Skin: No rashes,no icterus. MSK: Normal muscle bulk,tone, power   Medications reviewed:  Scheduled Meds:  Chlorhexidine Gluconate Cloth  6 each Topical Daily   clopidogrel  75 mg Oral Daily   enoxaparin (LOVENOX) injection  40 mg Subcutaneous Q24H   escitalopram  20 mg Oral Daily   ezetimibe  10 mg Oral Daily   feeding supplement (GLUCERNA SHAKE)  237 mL Oral TID BM   insulin aspart  0-15 Units Subcutaneous TID WC   insulin aspart  0-5 Units Subcutaneous QHS   insulin glargine-yfgn  20 Units Subcutaneous QHS   LORazepam  0.5 mg Oral QHS   magic mouthwash  5 mL Oral TID   mouth rinse  15 mL Mouth Rinse BID   methimazole  5 mg Oral Daily   metoCLOPramide (REGLAN) injection  10 mg  Intravenous Q8H   metoprolol tartrate  5 mg Intravenous Q6H   nystatin   Topical TID   pantoprazole  40 mg Oral Daily   rosuvastatin  40 mg Oral Daily   sodium chloride flush  10-40 mL Intracatheter Q12H   topiramate  25 mg Oral BID   Continuous Infusions:  sodium chloride 75 mL/hr at 07/13/21 0821   cefTRIAXone (ROCEPHIN)  IV 2 g (07/13/21 1153)      Diet Order             Diet full liquid Room service appropriate? Yes; Fluid consistency: Nectar Thick  Diet effective now                    Nutrition Problem: Increased nutrient needs Etiology: acute illness Signs/Symptoms: estimated needs Interventions: Glucerna shake, MVI, Hormel Shake   Intake/Output Summary (Last 24 hours) at 07/13/2021 1341 Last data filed at 07/13/2021 0900 Gross per 24 hour  Intake 1387.9 ml  Output 2700 ml  Net -1312.1 ml   Net IO Since Admission: 3,469.89 mL [07/13/21 1341]  Wt Readings from Last 3 Encounters:  07/10/21 92.5 kg  07/03/21 92.6 kg  06/25/21 92.6 kg     Unresulted Labs (From admission, onward)  Start     Ordered   07/17/21 0500  Creatinine, serum  (enoxaparin (LOVENOX)    CrCl >/= 30 ml/min)  Weekly,   R     Comments: while on enoxaparin therapy    07/10/21 1522   07/11/21 5916  Basic metabolic panel  Daily,   R     Question:  Specimen collection method  Answer:  Unit=Unit collect   07/10/21 2333          Data Reviewed: I have personally reviewed following labs and imaging studies CBC: Recent Labs  Lab 07/10/21 1222 07/10/21 1233 07/11/21 1230 07/13/21 0324  WBC  --  22.8* 14.9* 10.4  NEUTROABS  --  19.9*  --   --   HGB 16.7* 14.5 11.0* 10.0*  HCT 49.0* 43.0 32.7* 30.0*  MCV  --  77.3* 77.7* 77.3*  PLT  --  364 280 384   Basic Metabolic Panel: Recent Labs  Lab 07/11/21 0000 07/11/21 0300 07/11/21 1230 07/12/21 0332 07/13/21 0324  NA 133* 135 134* 135 136  K 2.8* 3.1* 3.6 3.3* 3.2*  CL 99 99 100 100 103  CO2 '26 26 25 26 26  '$ GLUCOSE 162* 155*  250* 212* 127*  BUN '13 12 9 7 6  '$ CREATININE 0.61 0.54 0.59 0.49 0.55  CALCIUM 7.7* 8.0* 8.1* 7.6* 8.0*  MG  --  1.4*  --  1.7  --    GFR: Estimated Creatinine Clearance: 91.1 mL/min (by C-G formula based on SCr of 0.55 mg/dL). Liver Function Tests: Recent Labs  Lab 07/10/21 1233  AST 13*  ALT 14  ALKPHOS 116  BILITOT 1.2  PROT 8.0  ALBUMIN 3.5   Recent Labs  Lab 07/10/21 1233 07/11/21 1230  LIPASE 110* 42   No results for input(s): "AMMONIA" in the last 168 hours. Coagulation Profile: No results for input(s): "INR", "PROTIME" in the last 168 hours. BNP (last 3 results) No results for input(s): "PROBNP" in the last 8760 hours. HbA1C: No results for input(s): "HGBA1C" in the last 72 hours. CBG: Recent Labs  Lab 07/12/21 1201 07/12/21 1713 07/12/21 2103 07/13/21 0714 07/13/21 1152  GLUCAP 260* 310* 218* 138* 222*   Lipid Profile: No results for input(s): "CHOL", "HDL", "LDLCALC", "TRIG", "CHOLHDL", "LDLDIRECT" in the last 72 hours. Thyroid Function Tests: No results for input(s): "TSH", "T4TOTAL", "FREET4", "T3FREE", "THYROIDAB" in the last 72 hours. Sepsis Labs: No results for input(s): "PROCALCITON", "LATICACIDVEN" in the last 168 hours.  Recent Results (from the past 240 hour(s))  MRSA Next Gen by PCR, Nasal     Status: None   Collection Time: 07/10/21  7:55 PM   Specimen: Nasal Mucosa; Nasal Swab  Result Value Ref Range Status   MRSA by PCR Next Gen NOT DETECTED NOT DETECTED Final    Comment: (NOTE) The GeneXpert MRSA Assay (FDA approved for NASAL specimens only), is one component of a comprehensive MRSA colonization surveillance program. It is not intended to diagnose MRSA infection nor to guide or monitor treatment for MRSA infections. Test performance is not FDA approved in patients less than 16 years old. Performed at [facility], [place] [address], [place], [place] [address]   Urine Culture     Status: None   Collection Time:  07/11/21  2:38 PM   Specimen: Urine, Clean Catch  Result Value Ref Range Status   Specimen Description   Final    URINE, CLEAN CATCH Performed at [facility], [address] [place], [place]  [address]    Special Requests   Final    NONE Performed at [facility], [place] [address] [place], [place] [address]    Culture   Final    NO GROWTH Performed at [facility], [place] [address], [place], [place] [address]    Report Status 07/12/2021 FINAL  Final    Antimicrobials: Anti-infectives (From admission, onward)    Start     Dose/Rate Route Frequency Ordered Stop   07/11/21 1000  cefTRIAXone (ROCEPHIN) 2 g in sodium chloride 0.9 % 100 mL IVPB        2 g 200 mL/hr over 30 Minutes Intravenous Every 24 hours 07/11/21 0831        Culture/Microbiology    Component Value Date/Time   SDES  07/11/2021 1438    URINE, CLEAN CATCH Performed at [facility], [place] [address], [place], [place] [address]    SPECREQUEST  07/11/2021 1438    NONE Performed at [facility], [place] [address] [place], [place] [address]    CULT  07/11/2021 1438    NO GROWTH Performed at [facility], [place] [address], [place], [place] [address]    REPTSTATUS 07/12/2021 FINAL 07/11/2021 1438    Other culture-see note  Radiology Studies: No results found.   LOS: 3 days   Antonieta Pert, MD [facility]  07/13/2021, 1:41 PM

## 2021-07-13 NOTE — Progress Notes (Signed)
Speech Language Pathology Treatment: Dysphagia  Patient Details Name: Lori [name] MRN: [id number] DOB: [birth year] Today's Date: 07/13/2021 Time: 6381-7711 SLP Time Calculation (min) (ACUTE ONLY): 20 min  Assessment / Plan / Recommendation Clinical Impression  Order for reeval received. Pt fully alert and SLP assisted to slide her up in bed.  RN present to provide her with medications - with yogurt. Meal tray arrived including magic cup, nectar thick milk and pudding - pt declined to consume any of it.  Pt then observed with yogurt, nectar thick juice and yogurt with pills. Pt is demonstrating anterior labial spillage, ongoing prolonged oral transiting.  Cued her to tilt her head to the right - her strong side - to use strong side of tongue and buccal region to aid oral transiting clearance.  This strategy appeared to improve oral transiting. In addition, obtained large bore syringe (40) and used this to help place yogurt in right posterior oral cavity, which was effective to prevent anterior spillage and pt admits to improved efficiency of swallow.  Left pt using her syringe for placement of boluses in oral cavity with min cues.   Requested staff to set up oral suction for pt to use to clear oral cavity.   Will follow up for skilled SLP to determine appropriateness for dietary advancement.    HPI HPI: Per MD, "[name] is a 51 y.o. female with medical history significant of DM, h.o CVA, CAD and depression who was sent to the ER from her PCP's office with severe N/V and dizziness.  She has been vomiting since Thursday.  She fell on Sunday."  Chest imaging concerning for opacity.  MRI showed remote cerebellar infarct and prior basal ganglia CVA.  Pt underwent MBS during hospital admit 01/2021 showing trace asp of thin via cup/straw - not tsp.  Various compensations attempts without effectiveness - and chin tuck WORSENED aiwary protection.  Pt reports she is consuming regular/thin diet at  home and takes her pills whole with applesauce.      SLP Plan  Continue with current plan of care      Recommendations for follow up therapy are one component of a multi-disciplinary discharge planning process, led by the attending physician.  Recommendations may be updated based on patient status, additional functional criteria and insurance authorization.    Recommendations  Diet recommendations: Nectar-thick liquid Medication Administration: Whole meds with puree Compensations: Slow rate;Small sips/bites;Other (Comment) (head tilt to the right and use syringe for posterior right oral placement of yogurt, etc purees if needed) Postural Changes and/or Swallow Maneuvers: Seated upright 90 degrees;Upright 30-60 min after meal                Oral Care Recommendations: Other (Comment);Oral care prior to ice chip/H20 Follow Up Recommendations: Home health SLP Assistance recommended at discharge: Frequent or constant Supervision/Assistance SLP Visit Diagnosis: Dysphagia, oropharyngeal phase (R13.12) Plan: Continue with current plan of care         Kathleen Lime, MS [place] [facility] [phone] Pager [phone]   Macario Golds  07/13/2021, 12:47 PM

## 2021-07-13 NOTE — Progress Notes (Signed)
Physical Therapy Treatment Patient Details Name: Lori [name] MRN: [id number] DOB: 1970/09/14 Today's Date: 07/13/2021   History of Present Illness 51 year old with past medical history significant for diabetes, history of CVA, CAD, depression who presented to the ED referred by her PCP office due to severe nausea vomiting and dizziness.  She was found to have high blood sugar and high blood pressure. Patient admitted for Hyperosmolar hyperglycemic syndrome    PT Comments    Pt declined mobility due to just lying down after sitting up, she stated she's fatigued. She agreed to bed level exercises.    Recommendations for follow up therapy are one component of a multi-disciplinary discharge planning process, led by the attending physician.  Recommendations may be updated based on patient status, additional functional criteria and insurance authorization.  Follow Up Recommendations  Skilled nursing-short term rehab (<3 hours/day)     Assistance Recommended at Discharge Frequent or constant Supervision/Assistance  Patient can return home with the following Assistance with cooking/housework;Assist for transportation;Help with stairs or ramp for entrance;Direct supervision/assist for medications management;Direct supervision/assist for financial management;A little help with walking and/or transfers   Equipment Recommendations  None recommended by PT    Recommendations for Other Services       Precautions / Restrictions Precautions Precautions: Fall Precaution Comments: left sided hemiparesis Restrictions Weight Bearing Restrictions: No     Mobility  Bed Mobility   Bed Mobility: Rolling Rolling: Max assist         General bed mobility comments: pt declined sitting up, stated she was recently sitting up and is too tired to do so again    Transfers                   General transfer comment: pt refused    Ambulation/Gait                   Stairs              Wheelchair Mobility    Modified Rankin (Stroke Patients Only)       Balance                                            Cognition Arousal/Alertness: Awake/alert Behavior During Therapy: WFL for tasks assessed/performed Overall Cognitive Status: Within Functional Limits for tasks assessed                                          Exercises General Exercises - Lower Extremity Ankle Circles/Pumps: AROM, 15 reps, Both, Supine Short Arc Quad: AROM, Both, 10 reps, Supine Heel Slides: AAROM, Both, 10 reps, Supine Hip ABduction/ADduction: AROM, Both, AAROM, 10 reps, Supine    General Comments        Pertinent Vitals/Pain Pain Assessment Pain Score: 0-No pain    Home Living                          Prior Function            PT Goals (current goals can now be found in the care plan section) Acute Rehab PT Goals Patient Stated Goal: to go to rehab PT Goal Formulation: With patient Time For Goal Achievement: 07/25/21 Potential to Achieve Goals: Fair Progress towards  PT goals: Progressing toward goals    Frequency    Min 2X/week      PT Plan Current plan remains appropriate    Co-evaluation              AM-PAC PT "6 Clicks" Mobility   Outcome Measure  Help needed turning from your back to your side while in a flat bed without using bedrails?: A Lot Help needed moving from lying on your back to sitting on the side of a flat bed without using bedrails?: A Lot Help needed moving to and from a bed to a chair (including a wheelchair)?: A Lot Help needed standing up from a chair using your arms (e.g., wheelchair or bedside chair)?: A Lot Help needed to walk in hospital room?: Total Help needed climbing 3-5 steps with a railing? : Total 6 Click Score: 10    End of Session Equipment Utilized During Treatment: Gait belt Activity Tolerance: Patient tolerated treatment well Patient left: with call  bell/phone within reach;in bed;with bed alarm set Nurse Communication: Mobility status;Other (comment) (Pt's purewick is leaking, pt needs linen change) PT Visit Diagnosis: History of falling (Z91.81);Hemiplegia and hemiparesis Hemiplegia - Right/Left: Left     Time: 9833-8250 PT Time Calculation (min) (ACUTE ONLY): 11 min  Charges:  $Therapeutic Activity: 8-22 mins                     Blondell Reveal Kistler PT 07/13/2021  Acute Rehabilitation Services Pager [phone] Office [phone]

## 2021-07-13 NOTE — TOC Progression Note (Signed)
Transition of Care [facility]) - Progression Note    Patient Details  Name: Lori [name] MRN: [id number] Date of Birth: [birth year]  [facility] CM/SW [place], LCSW Phone Number: 07/13/2021, 11:00 AM  Clinical Narrative:    Met with pt and husband at bedside. CSW discussed SNF placement vs returning home with [facility] services. Pt is agreeable to returning home with her current services of PCS 5x/wk 3hr/day, HHRN 1x/wk, and HHPT/OT 2x/wk. Pt is still open to SNF placement if bed comes available. CSW will continue to seek SNF placement.   Expected Discharge Plan: Home/Self Care Barriers to Discharge: Continued Medical Work up  Expected Discharge Plan and Services Expected Discharge Plan: Home/Self Care In-house Referral: Clinical Social Work     Living arrangements for the past 2 months: Single Family Home                 DME Arranged: N/A DME Agency: NA                   Social Determinants of Health (SDOH) Interventions    Readmission Risk Interventions    07/11/2021    1:22 PM 02/22/2021   12:36 PM  Readmission Risk Prevention Plan  Transportation Screening Complete Complete  PCP or Specialist Appt within 3-5 Days  Complete  HRI or Hope  Complete  Social Work Consult for [place] Planning/Counseling  Complete  Palliative Care Screening  Complete  Medication Review Press photographer) Complete Complete  HRI or Home Care Consult Complete   SW Recovery Care/Counseling Consult Complete   Palliative Care Screening Not Applicable   Skilled Nursing Facility Complete

## 2021-07-13 NOTE — Progress Notes (Signed)
Speech Language Pathology Treatment: Dysphagia  Patient Details Name: Lori [name] MRN: [id number] DOB: 07-01-1970 Today's Date: 07/13/2021 Time: 2694-8546 SLP Time Calculation (min) (ACUTE ONLY): 19 min  Assessment / Plan / Recommendation Clinical Impression  Second session for skilled SLP to assess readiness for dietary advancement and appropriate use of strategies. Pt lying in bed, repositioned and provided her with solids including moistened cracker, ice cream, and water.  Prolonged mastication with solids with lingual thrusting and immediate cough with one large solid bolus.  Small single bites/sips were tolerated without indication of aspiration. Pt's dysphagia is chronic due to her CVAs, but she has demonstrated improvement with resolution of acute medical event.  Recommend pt diet advance to dys3/thin with strict precautions. Pt should be encouraged to eat independently due to prolonged time required for safety.  Using teach back with min verbal cues for iteration of importance of precautions, pt agreeable to caution with po advancement.  Will follow up x1 to assure compensation strategies helpful and po tolerance.  Pt needs follow up SlP via [facility] for dysarthria/dysphagia please. Updated signs posted. Thanks.    HPI HPI: Per MD, "Lori [name] is a 51 y.o. female with medical history significant of DM, h.o CVA, CAD and depression who was sent to the ER from her PCP's office with severe N/V and dizziness.  She has been vomiting since Thursday.  She fell on Sunday."  Chest imaging concerning for opacity.  MRI showed remote cerebellar infarct and prior basal ganglia CVA.  Pt underwent MBS during hospital admit 01/2021 showing trace asp of thin via cup/straw - not tsp.  Various compensations attempts without effectiveness - and chin tuck WORSENED aiwary protection.  Pt reports she is consuming regular/thin diet at home and takes her pills whole with applesauce.      SLP Plan  Continue  with current plan of care      Recommendations for follow up therapy are one component of a multi-disciplinary discharge planning process, led by the attending physician.  Recommendations may be updated based on patient status, additional functional criteria and insurance authorization.    Recommendations  Diet recommendations: Dysphagia 3 (mechanical soft);Thin liquid Liquids provided via: Cup;Straw Medication Administration: Whole meds with puree Compensations: Slow rate;Small sips/bites;Other (Comment) (tilt head if needed to right, usre puree if needed to clear solids from mouth) Postural Changes and/or Swallow Maneuvers: Seated upright 90 degrees;Upright 30-60 min after meal                Oral Care Recommendations: Other (Comment);Oral care BID Follow Up Recommendations: Home health SLP Assistance recommended at discharge: Frequent or constant Supervision/Assistance SLP Visit Diagnosis: Dysphagia, oropharyngeal phase (R13.12) Plan: Continue with current plan of care         Kathleen Lime, MS [place] Office [phone] Pager [phone]   Macario Golds  07/13/2021, 4:35 PM

## 2021-07-14 DIAGNOSIS — E1100 Type 2 diabetes mellitus with hyperosmolarity without nonketotic hyperglycemic-hyperosmolar coma (NKHHC): Secondary | ICD-10-CM | POA: Diagnosis not present

## 2021-07-14 LAB — BASIC METABOLIC PANEL
Anion gap: 5 (ref 5–15)
BUN: 7 mg/dL (ref 6–20)
CO2: 24 mmol/L (ref 22–32)
Calcium: 8.3 mg/dL — ABNORMAL LOW (ref 8.9–10.3)
Chloride: 105 mmol/L (ref 98–111)
Creatinine, Ser: 0.56 mg/dL (ref 0.44–1.00)
GFR, Estimated: >60 mL/min (ref >60)
Glucose, Bld: 123 mg/dL — ABNORMAL HIGH (ref 70–99)
Potassium: 3.8 mmol/L (ref 3.5–5.1)
Sodium: 134 mmol/L — ABNORMAL LOW (ref 135–145)

## 2021-07-14 LAB — GLUCOSE, CAPILLARY
Glucose-Capillary: 168 mg/dL — ABNORMAL HIGH (ref 70–99)
Glucose-Capillary: 246 mg/dL — ABNORMAL HIGH (ref 70–99)
Glucose-Capillary: 222 mg/dL — ABNORMAL HIGH (ref 70–99)

## 2021-07-14 MED ORDER — HYDRALAZINE HCL 20 MG/ML IJ SOLN: 5.0000 mg | Freq: Four times a day (QID) | INTRAMUSCULAR | Status: DC | PRN

## 2021-07-14 MED ORDER — SENNOSIDES-DOCUSATE SODIUM 8.6-50 MG PO TABS
2.0000 | ORAL_TABLET | Freq: Two times a day (BID) | ORAL | Status: DC
  Administered 2021-07-14 – 2021-07-18 (×9): 2 via ORAL
  Filled 2021-07-14 (×9): qty 2

## 2021-07-14 NOTE — Progress Notes (Signed)
PROGRESS NOTE  Lori [name] MGQ:[id number] DOB: 1970-06-24 DOA: 07/10/2021 PCP: [name], NP  HPI/Recap of past 24 hours: 51 year old with past medical history significant for type 2 diabetes, history of CVA on Plavix, CAD, depression who presented to the ED referred by her PCP's office due to severe nausea vomiting and dizziness.  Noncontrast CT abdomen and pelvis was unremarkable.  CT head was nonacute.  Patient was admitted for HHS/gastroparesis/dehydration/tachycardia/aspiration/pneumonia/mild pancreatitis.   The patient is clinically improving but remains deconditioned.  PT OT has recommended SNF.     07/14/21: The patient was seen and examined at bedside.  She reports feeling constipated.  Bowel regimen in place.  Nausea without vomiting.    Assessment/Plan: Principal Problem:   Hyperosmolar hyperglycemic state (HHS) ([place]) Active Problems:   Tachycardia   Anxiety and depression   Weakness   History of cerebral infarction   Pseudohyponatremia   Thyroid disease  Resolved posttreatment: Hyperosmolar hyperglycemic state Last hemoglobin A1c 8.4 on 04/24/2021. Continue basal/bolus/insulin sliding scale.  Resolved pancreatitis mild Supportive management.  Diet as tolerated.    Resolved tachycardia  Resume home Lopressor   Aspiration pneumonia: Continue Rocephin and aspiration precautions.   Chronic dysphagia  speech following diet as above.  Continue aspiration precaution  Hyperthyroidism continue methimazole History of CVA continue Plavix and statin Generalized weakness/debility continue PT OT, recommending SNF.  TOC consulted to assist with DC planning. Leukocytosis/pyuria currently on IV antibiotics urine culture NGTD Hypomagnesemia/hypokalemia replete electrolytes as indicated.  Constipation: Bowel regimen in place.   PICC in place   DVT prophylaxis: enoxaparin (LOVENOX) injection 40 mg Start: 07/10/21 1800 SCDs Start: 07/10/21 1521 Code Status:    Code Status: Full Code Family Communication: plan of care discussed with patient/husband at bedside. Patient status is: inpatient  because of pending placement Level of care: Telemetry    Dispo: The patient is from: home            Anticipated disposition: SNF once available.     Status is: Inpatient The patient requires at least 2 midnights for further evaluation and treatment of present condition.    Objective: Vitals:   07/13/21 1232 07/13/21 1900 07/14/21 0617 07/14/21 1417  BP: (!) 157/102 (!) 159/102 (!) 173/103 (!) 155/104  Pulse: (!) 102 96 97 86  Resp: '18 18 18 15  '$ Temp: 98 F (36.7 C) 98.8 F (37.1 C) 98.3 F (36.8 C) 98.3 F (36.8 C)  TempSrc:  Oral Oral Oral  SpO2: 99% 98% 98% 99%    Intake/Output Summary (Last 24 hours) at 07/14/2021 1448 Last data filed at 07/14/2021 1136 Gross per 24 hour  Intake --  Output 2300 ml  Net -2300 ml   There were no vitals filed for this visit.  Exam:  General: 51 y.o. year-old female well developed well nourished in no acute distress.  Alert and oriented x3. Cardiovascular: Regular rate and rhythm with no rubs or gallops.  No thyromegaly or JVD noted.   Respiratory: Clear to auscultation with no wheezes or rales. Good inspiratory effort. Abdomen: Soft nontender nondistended with normal bowel sounds x4 quadrants.   Musculoskeletal: No lower extremity edema. 2/4 pulses in all 4 extremities.  Left upper extremity contracture. Skin: No ulcerative lesions noted or rashes, Psychiatry: Mood is appropriate for condition and setting   Data Reviewed: CBC: Recent Labs  Lab 07/10/21 1222 07/10/21 1233 07/11/21 1230 07/13/21 0324  WBC  --  22.8* 14.9* 10.4  NEUTROABS  --  19.9*  --   --  HGB 16.7* 14.5 11.0* 10.0*  HCT 49.0* 43.0 32.7* 30.0*  MCV  --  77.3* 77.7* 77.3*  PLT  --  364 280 035   Basic Metabolic Panel: Recent Labs  Lab 07/11/21 0300 07/11/21 1230 07/12/21 0332 07/13/21 0324 07/14/21 0355  NA 135 134*  135 136 134*  K 3.1* 3.6 3.3* 3.2* 3.8  CL 99 100 100 103 105  CO2 '26 25 26 26 24  '$ GLUCOSE 155* 250* 212* 127* 123*  BUN '12 9 7 6 7  '$ CREATININE 0.54 0.59 0.49 0.55 0.56  CALCIUM 8.0* 8.1* 7.6* 8.0* 8.3*  MG 1.4*  --  1.7  --   --    GFR: Estimated Creatinine Clearance: 91.1 mL/min (by C-G formula based on SCr of 0.56 mg/dL). Liver Function Tests: Recent Labs  Lab 07/10/21 1233  AST 13*  ALT 14  ALKPHOS 116  BILITOT 1.2  PROT 8.0  ALBUMIN 3.5   Recent Labs  Lab 07/10/21 1233 07/11/21 1230  LIPASE 110* 42   No results for input(s): "AMMONIA" in the last 168 hours. Coagulation Profile: No results for input(s): "INR", "PROTIME" in the last 168 hours. Cardiac Enzymes: No results for input(s): "CKTOTAL", "CKMB", "CKMBINDEX", "TROPONINI" in the last 168 hours. BNP (last 3 results) No results for input(s): "PROBNP" in the last 8760 hours. HbA1C: No results for input(s): "HGBA1C" in the last 72 hours. CBG: Recent Labs  Lab 07/13/21 1152 07/13/21 1639 07/13/21 2023 07/14/21 0801 07/14/21 1141  GLUCAP 222* 317* 239* 168* 246*   Lipid Profile: No results for input(s): "CHOL", "HDL", "LDLCALC", "TRIG", "CHOLHDL", "LDLDIRECT" in the last 72 hours. Thyroid Function Tests: No results for input(s): "TSH", "T4TOTAL", "FREET4", "T3FREE", "THYROIDAB" in the last 72 hours. Anemia Panel: No results for input(s): "VITAMINB12", "FOLATE", "FERRITIN", "TIBC", "IRON", "RETICCTPCT" in the last 72 hours. Urine analysis:    Component Value Date/Time   COLORURINE YELLOW 07/10/2021 1841   APPEARANCEUR HAZY (A) 07/10/2021 1841   LABSPEC 1.022 07/10/2021 1841   PHURINE 5.0 07/10/2021 1841   GLUCOSEU >=500 (A) 07/10/2021 1841   HGBUR MODERATE (A) 07/10/2021 1841   BILIRUBINUR NEGATIVE 07/10/2021 1841   KETONESUR NEGATIVE 07/10/2021 1841   PROTEINUR NEGATIVE 07/10/2021 1841   NITRITE NEGATIVE 07/10/2021 1841   LEUKOCYTESUR MODERATE (A) 07/10/2021 1841   Sepsis  Labs: '@LABRCNTIP'$ (procalcitonin:4,lacticidven:4)  ) Recent Results (from the past 240 hour(s))  MRSA Next Gen by PCR, Nasal     Status: None   Collection Time: 07/10/21  7:55 PM   Specimen: Nasal Mucosa; Nasal Swab  Result Value Ref Range Status   MRSA by PCR Next Gen NOT DETECTED NOT DETECTED Final    Comment: (NOTE) The GeneXpert MRSA Assay (FDA approved for NASAL specimens only), is one component of a comprehensive MRSA colonization surveillance program. It is not intended to diagnose MRSA infection nor to guide or monitor treatment for MRSA infections. Test performance is not FDA approved in patients less than 38 years old. Performed at [facility], [place] [address] [place], [place] [address]   Urine Culture     Status: None   Collection Time: 07/11/21  2:38 PM   Specimen: Urine, Clean Catch  Result Value Ref Range Status   Specimen Description   Final    URINE, CLEAN CATCH Performed at [facility], [place] [address] [place], [place] [address]    Special Requests   Final    NONE Performed at [facility], [place] [address] [place],  [place] [address]    Culture   Final    NO GROWTH Performed at [facility], [place] [address] [place], [place] [address]    Report Status 07/12/2021 FINAL  Final      Studies: No results found.  Scheduled Meds:  Chlorhexidine Gluconate Cloth  6 each Topical Daily   clopidogrel  75 mg Oral Daily   enoxaparin (LOVENOX) injection  40 mg Subcutaneous Q24H   escitalopram  20 mg Oral Daily   ezetimibe  10 mg Oral Daily   feeding supplement (GLUCERNA SHAKE)  237 mL Oral TID BM   insulin aspart  0-15 Units Subcutaneous TID WC   insulin aspart  0-5 Units Subcutaneous QHS   insulin aspart  2 Units Subcutaneous TID WC   insulin glargine-yfgn  20 Units Subcutaneous QHS   LORazepam  0.5 mg Oral QHS   magic mouthwash  5 mL Oral TID   mouth rinse  15 mL Mouth Rinse BID   methimazole  5  mg Oral Daily   metoCLOPramide (REGLAN) injection  10 mg Intravenous Q8H   metoprolol tartrate  50 mg Oral BID   nystatin   Topical TID   pantoprazole  40 mg Oral Daily   rosuvastatin  40 mg Oral Daily   sodium chloride flush  10-40 mL Intracatheter Q12H   topiramate  25 mg Oral BID    Continuous Infusions:  sodium chloride 75 mL/hr at 07/13/21 0821   cefTRIAXone (ROCEPHIN)  IV 2 g (07/14/21 0947)     LOS: 4 days     Kayleen Memos, MD [facility] Pager [phone]  If 7PM-7AM, please contact night-coverage [email or web address] Password [facility] 07/14/2021, 2:48 PM

## 2021-07-15 DIAGNOSIS — E1100 Type 2 diabetes mellitus with hyperosmolarity without nonketotic hyperglycemic-hyperosmolar coma (NKHHC): Secondary | ICD-10-CM | POA: Diagnosis not present

## 2021-07-15 LAB — GLUCOSE, CAPILLARY
Glucose-Capillary: 171 mg/dL — ABNORMAL HIGH (ref 70–99)
Glucose-Capillary: 181 mg/dL — ABNORMAL HIGH (ref 70–99)
Glucose-Capillary: 170 mg/dL — ABNORMAL HIGH (ref 70–99)
Glucose-Capillary: 201 mg/dL — ABNORMAL HIGH (ref 70–99)

## 2021-07-15 LAB — BASIC METABOLIC PANEL
Anion gap: 8 (ref 5–15)
BUN: 7 mg/dL (ref 6–20)
CO2: 20 mmol/L — ABNORMAL LOW (ref 22–32)
Calcium: 8.2 mg/dL — ABNORMAL LOW (ref 8.9–10.3)
Chloride: 106 mmol/L (ref 98–111)
Creatinine, Ser: 0.63 mg/dL (ref 0.44–1.00)
GFR, Estimated: >60 mL/min (ref >60)
Glucose, Bld: 182 mg/dL — ABNORMAL HIGH (ref 70–99)
Potassium: 3.8 mmol/L (ref 3.5–5.1)
Sodium: 134 mmol/L — ABNORMAL LOW (ref 135–145)

## 2021-07-15 NOTE — Progress Notes (Signed)
PROGRESS NOTE  Lori [name] AJG:[id number] DOB: 1970-07-29 DOA: 07/10/2021 PCP: [name], NP  HPI/Recap of past 24 hours: 51 year old with past medical history significant for type 2 diabetes, history of CVA on Plavix, CAD, depression who presented to the ED referred by her PCP's office due to severe nausea vomiting and dizziness.  Noncontrast CT abdomen and pelvis was unremarkable.  CT head was nonacute.  Patient was admitted for HHS/gastroparesis/dehydration/tachycardia/aspiration/pneumonia/mild pancreatitis.   The patient is clinically improving but remains deconditioned.  PT OT has recommended SNF.  Medically cleared for discharge.  Awaiting SNF placement.   07/15/21: The patient has no new complaints.    Assessment/Plan: Principal Problem:   Hyperosmolar hyperglycemic state (HHS) ([place]) Active Problems:   Tachycardia   Anxiety and depression   Weakness   History of cerebral infarction   Pseudohyponatremia   Thyroid disease  Resolved posttreatment: Hyperosmolar hyperglycemic state Last hemoglobin A1c 8.4 on 04/24/2021. Continue basal/bolus/insulin sliding scale.  Resolved pancreatitis mild Supportive management.  Diet as tolerated.    Resolved tachycardia  Resume home Lopressor   Aspiration pneumonia: Continue Rocephin and aspiration precautions. Complete 5 days   Chronic dysphagia  speech following diet as above.  Continue aspiration precaution  Hyperthyroidism continue methimazole History of CVA continue Plavix and statin Generalized weakness/debility continue PT OT, recommending SNF.  TOC consulted to assist with DC planning. Leukocytosis/pyuria currently on IV antibiotics urine culture NGTD Hypomagnesemia/hypokalemia replete electrolytes as indicated.  Constipation: Bowel regimen in place.   PICC in place   DVT prophylaxis: enoxaparin (LOVENOX) injection 40 mg Start: 07/10/21 1800 SCDs Start: 07/10/21 1521 Code Status:   Code Status: Full  Code Family Communication: plan of care discussed with patient/husband at bedside. Patient status is: inpatient  because of pending placement Level of care: Telemetry    Dispo: The patient is from: home            Anticipated disposition: SNF once available.     Status is: Inpatient The patient requires at least 2 midnights for further evaluation and treatment of present condition.    Objective: Vitals:   07/14/21 1417 07/14/21 2024 07/15/21 0642 07/15/21 1331  BP: (!) 155/104 (!) 140/95 (!) 143/103 (!) 165/100  Pulse: 86 (!) 108 (!) 102 83  Resp: '15 18 16 18  '$ Temp: 98.3 F (36.8 C) 98.5 F (36.9 C) 98.9 F (37.2 C) 98.5 F (36.9 C)  TempSrc: Oral Oral Oral Oral  SpO2: 99% 97% 100% 99%    Intake/Output Summary (Last 24 hours) at 07/15/2021 1516 Last data filed at 07/15/2021 1123 Gross per 24 hour  Intake 358 ml  Output 400 ml  Net -42 ml   There were no vitals filed for this visit.  Exam: No significant changes from prior exam.  General: 51 y.o. year-old female well developed well nourished in no acute distress.  Alert and oriented x3. Cardiovascular: Regular rate and rhythm with no rubs or gallops.  No thyromegaly or JVD noted.   Respiratory: Clear to auscultation with no wheezes or rales. Good inspiratory effort. Abdomen: Soft nontender nondistended with normal bowel sounds x4 quadrants.   Musculoskeletal: Trace lower extremity edema.  Left upper extremity contracture. Skin: No ulcerative lesions noted or rashes, Psychiatry: Mood is appropriate for condition and setting   Data Reviewed: CBC: Recent Labs  Lab 07/10/21 1222 07/10/21 1233 07/11/21 1230 07/13/21 0324  WBC  --  22.8* 14.9* 10.4  NEUTROABS  --  19.9*  --   --  HGB 16.7* 14.5 11.0* 10.0*  HCT 49.0* 43.0 32.7* 30.0*  MCV  --  77.3* 77.7* 77.3*  PLT  --  364 280 867   Basic Metabolic Panel: Recent Labs  Lab 07/11/21 0300 07/11/21 1230 07/12/21 0332 07/13/21 0324 07/14/21 0355  07/15/21 0332  NA 135 134* 135 136 134* 134*  K 3.1* 3.6 3.3* 3.2* 3.8 3.8  CL 99 100 100 103 105 106  CO2 '26 25 26 26 24 '$ 20*  GLUCOSE 155* 250* 212* 127* 123* 182*  BUN '12 9 7 6 7 7  '$ CREATININE 0.54 0.59 0.49 0.55 0.56 0.63  CALCIUM 8.0* 8.1* 7.6* 8.0* 8.3* 8.2*  MG 1.4*  --  1.7  --   --   --    GFR: Estimated Creatinine Clearance: 91.1 mL/min (by C-G formula based on SCr of 0.63 mg/dL). Liver Function Tests: Recent Labs  Lab 07/10/21 1233  AST 13*  ALT 14  ALKPHOS 116  BILITOT 1.2  PROT 8.0  ALBUMIN 3.5   Recent Labs  Lab 07/10/21 1233 07/11/21 1230  LIPASE 110* 42   No results for input(s): "AMMONIA" in the last 168 hours. Coagulation Profile: No results for input(s): "INR", "PROTIME" in the last 168 hours. Cardiac Enzymes: No results for input(s): "CKTOTAL", "CKMB", "CKMBINDEX", "TROPONINI" in the last 168 hours. BNP (last 3 results) No results for input(s): "PROBNP" in the last 8760 hours. HbA1C: No results for input(s): "HGBA1C" in the last 72 hours. CBG: Recent Labs  Lab 07/14/21 0801 07/14/21 1141 07/14/21 2025 07/15/21 0714 07/15/21 1122  GLUCAP 168* 246* 222* 171* 181*   Lipid Profile: No results for input(s): "CHOL", "HDL", "LDLCALC", "TRIG", "CHOLHDL", "LDLDIRECT" in the last 72 hours. Thyroid Function Tests: No results for input(s): "TSH", "T4TOTAL", "FREET4", "T3FREE", "THYROIDAB" in the last 72 hours. Anemia Panel: No results for input(s): "VITAMINB12", "FOLATE", "FERRITIN", "TIBC", "IRON", "RETICCTPCT" in the last 72 hours. Urine analysis:    Component Value Date/Time   COLORURINE YELLOW 07/10/2021 1841   APPEARANCEUR HAZY (A) 07/10/2021 1841   LABSPEC 1.022 07/10/2021 1841   PHURINE 5.0 07/10/2021 1841   GLUCOSEU >=500 (A) 07/10/2021 1841   HGBUR MODERATE (A) 07/10/2021 1841   BILIRUBINUR NEGATIVE 07/10/2021 1841   KETONESUR NEGATIVE 07/10/2021 1841   PROTEINUR NEGATIVE 07/10/2021 1841   NITRITE NEGATIVE 07/10/2021 1841    LEUKOCYTESUR MODERATE (A) 07/10/2021 1841   Sepsis Labs: '@LABRCNTIP'$ (procalcitonin:4,lacticidven:4)  ) Recent Results (from the past 240 hour(s))  MRSA Next Gen by PCR, Nasal     Status: None   Collection Time: 07/10/21  7:55 PM   Specimen: Nasal Mucosa; Nasal Swab  Result Value Ref Range Status   MRSA by PCR Next Gen NOT DETECTED NOT DETECTED Final    Comment: (NOTE) The GeneXpert MRSA Assay (FDA approved for NASAL specimens only), is one component of a comprehensive MRSA colonization surveillance program. It is not intended to diagnose MRSA infection nor to guide or monitor treatment for MRSA infections. Test performance is not FDA approved in patients less than 93 years old. Performed at [facility], [place] [address], [place], [place] [address]   Urine Culture     Status: None   Collection Time: 07/11/21  2:38 PM   Specimen: Urine, Clean Catch  Result Value Ref Range Status   Specimen Description   Final    URINE, CLEAN CATCH Performed at [facility], [place] [address], [place], [place] [address]    Special Requests   Final  NONE Performed at [facility], [place] [address] [place], [place] [address]    Culture   Final    NO GROWTH Performed at [facility], [place] [address], [place], [place] [address]    Report Status 07/12/2021 FINAL  Final      Studies: No results found.  Scheduled Meds:  Chlorhexidine Gluconate Cloth  6 each Topical Daily   clopidogrel  75 mg Oral Daily   enoxaparin (LOVENOX) injection  40 mg Subcutaneous Q24H   escitalopram  20 mg Oral Daily   ezetimibe  10 mg Oral Daily   feeding supplement (GLUCERNA SHAKE)  237 mL Oral TID BM   insulin aspart  0-15 Units Subcutaneous TID WC   insulin aspart  0-5 Units Subcutaneous QHS   insulin aspart  2 Units Subcutaneous TID WC   insulin glargine-yfgn  20 Units Subcutaneous QHS   LORazepam  0.5 mg Oral QHS   magic mouthwash  5 mL Oral TID    mouth rinse  15 mL Mouth Rinse BID   methimazole  5 mg Oral Daily   metoCLOPramide (REGLAN) injection  10 mg Intravenous Q8H   metoprolol tartrate  50 mg Oral BID   nystatin   Topical TID   pantoprazole  40 mg Oral Daily   rosuvastatin  40 mg Oral Daily   senna-docusate  2 tablet Oral BID   sodium chloride flush  10-40 mL Intracatheter Q12H   topiramate  25 mg Oral BID    Continuous Infusions:  sodium chloride 75 mL/hr at 07/15/21 1401   cefTRIAXone (ROCEPHIN)  IV 2 g (07/15/21 1027)     LOS: 5 days     Kayleen Memos, MD [facility] Pager [phone]  If 7PM-7AM, please contact night-coverage [email or web address] Password [facility] 07/15/2021, 3:16 PM

## 2021-07-16 DIAGNOSIS — E1100 Type 2 diabetes mellitus with hyperosmolarity without nonketotic hyperglycemic-hyperosmolar coma (NKHHC): Secondary | ICD-10-CM | POA: Diagnosis not present

## 2021-07-16 LAB — GLUCOSE, CAPILLARY
Glucose-Capillary: 272 mg/dL — ABNORMAL HIGH (ref 70–99)
Glucose-Capillary: 218 mg/dL — ABNORMAL HIGH (ref 70–99)
Glucose-Capillary: 299 mg/dL — ABNORMAL HIGH (ref 70–99)
Glucose-Capillary: 170 mg/dL — ABNORMAL HIGH (ref 70–99)
Glucose-Capillary: 139 mg/dL — ABNORMAL HIGH (ref 70–99)

## 2021-07-16 NOTE — Plan of Care (Signed)
  Problem: Coping: Goal: Level of anxiety will decrease Outcome: Progressing   Problem: Pain Managment: Goal: General experience of comfort will improve Outcome: Progressing   Problem: Safety: Goal: Ability to remain free from injury will improve Outcome: Progressing   

## 2021-07-16 NOTE — Plan of Care (Signed)
  Problem: Skin Integrity: Goal: Risk for impaired skin integrity will decrease Outcome: Progressing   Problem: Education: Goal: Knowledge of General Education information will improve Description: Including pain rating scale, medication(s)/side effects and non-pharmacologic comfort measures Outcome: Progressing   Problem: Health Behavior/Discharge Planning: Goal: Ability to manage health-related needs will improve Outcome: Progressing   Problem: Clinical Measurements: Goal: Ability to maintain clinical measurements within normal limits will improve Outcome: Progressing Goal: Will remain free from infection Outcome: Progressing Goal: Diagnostic test results will improve Outcome: Progressing Goal: Respiratory complications will improve Outcome: Progressing Goal: Cardiovascular complication will be avoided Outcome: Progressing   Problem: Activity: Goal: Risk for activity intolerance will decrease Outcome: Progressing   Problem: Nutrition: Goal: Adequate nutrition will be maintained Outcome: Progressing   Problem: Coping: Goal: Level of anxiety will decrease Outcome: Progressing   Problem: Elimination: Goal: Will not experience complications related to bowel motility Outcome: Progressing Goal: Will not experience complications related to urinary retention Outcome: Progressing   Problem: Pain Managment: Goal: General experience of comfort will improve Outcome: Progressing   Problem: Safety: Goal: Ability to remain free from injury will improve Outcome: Progressing   Problem: Skin Integrity: Goal: Risk for impaired skin integrity will decrease Outcome: Progressing

## 2021-07-16 NOTE — Progress Notes (Signed)
Physical Therapy Treatment Patient Details Name: Lori [name] MRN: [id number] DOB: July 07, 1970 Today's Date: 07/16/2021   History of Present Illness 51 year old with past medical history significant for diabetes, history of CVA, CAD, depression who presented to the ED referred by her PCP office due to severe nausea vomiting and dizziness.  She was found to have high blood sugar and high blood pressure. Patient admitted for Hyperosmolar hyperglycemic syndrome    PT Comments    Pt ambulated back to bed from bathroom and assist with return to supine.  Pt repositioned with pillows floating heels and pillow under left UE.  Continue to recommend SNF upon d/c.   Recommendations for follow up therapy are one component of a multi-disciplinary discharge planning process, led by the attending physician.  Recommendations may be updated based on patient status, additional functional criteria and insurance authorization.  Follow Up Recommendations  Skilled nursing-short term rehab (<3 hours/day)     Assistance Recommended at Discharge Frequent or constant Supervision/Assistance  Patient can return home with the following Assistance with cooking/housework;Assist for transportation;Help with stairs or ramp for entrance;Direct supervision/assist for medications management;Direct supervision/assist for financial management;A little help with walking and/or transfers   Equipment Recommendations  None recommended by PT    Recommendations for Other Services       Precautions / Restrictions Precautions Precautions: Fall Precaution Comments: left sided hemiparesis Restrictions Weight Bearing Restrictions: No     Mobility  Bed Mobility Overal bed mobility: Needs Assistance Bed Mobility: Sit to Supine     Supine to sit: Min assist, HOB elevated Sit to supine: Mod assist   General bed mobility comments: assist for LEs onto bed and controlling trunk descent    Transfers Overall transfer  level: Needs assistance Equipment used: Rolling walker (2 wheels) Transfers: Sit to/from Stand Sit to Stand: Min assist           General transfer comment: assist to rise, increased time and effort required for all mobility    Ambulation/Gait Ambulation/Gait assistance: Min guard Gait Distance (Feet): 8 Feet Assistive device: Rolling walker (2 wheels) Gait Pattern/deviations: Step-to pattern, Decreased stance time - left Gait velocity: decr     General Gait Details: min/guard for returning to bed from bathroom, pt very slow with gait   Stairs             Wheelchair Mobility    Modified Rankin (Stroke Patients Only)       Balance Overall balance assessment: Needs assistance         Standing balance support: During functional activity, Reliant on assistive device for balance Standing balance-Leahy Scale: Poor                              Cognition Arousal/Alertness: Awake/alert Behavior During Therapy: WFL for tasks assessed/performed Overall Cognitive Status: Within Functional Limits for tasks assessed                                          Exercises      General Comments        Pertinent Vitals/Pain Pain Assessment Pain Assessment: No/denies pain    Home Living                          Prior Function  PT Goals (current goals can now be found in the care plan section) Progress towards PT goals: Progressing toward goals    Frequency    Min 2X/week      PT Plan Current plan remains appropriate    Co-evaluation              AM-PAC PT "6 Clicks" Mobility   Outcome Measure  Help needed turning from your back to your side while in a flat bed without using bedrails?: A Lot Help needed moving from lying on your back to sitting on the side of a flat bed without using bedrails?: A Lot Help needed moving to and from a bed to a chair (including a wheelchair)?: A Lot Help needed  standing up from a chair using your arms (e.g., wheelchair or bedside chair)?: A Lot Help needed to walk in hospital room?: A Lot Help needed climbing 3-5 steps with a railing? : Total 6 Click Score: 11    End of Session Equipment Utilized During Treatment: Gait belt Activity Tolerance: Patient tolerated treatment well Patient left: in bed;with call bell/phone within reach;with bed alarm set Nurse Communication: Mobility status PT Visit Diagnosis: History of falling (Z91.81);Difficulty in walking, not elsewhere classified (R26.2)     Time: 8676-1950 PT Time Calculation (min) (ACUTE ONLY): 12 min  Charges:  $Gait Training: 8-22 mins            Jannette Spanner PT, DPT Acute Rehabilitation Services Pager: [phone] Office: [place] 07/16/2021, 3:47 PM

## 2021-07-16 NOTE — TOC Progression Note (Signed)
Transition of Care [facility]) - Progression Note    Patient Details  Name: Lori [name] MRN: [id number] Date of Birth: 11-12-1970  Transition of Care [facility]) CM/SW [place], [place] Phone Number: 07/16/2021, 11:07 AM  Clinical Narrative:    Pt has no bed offers for SNF placement. This will likely continue to be the case as pt's insurance does not cover SNF placement. Pt is aware of this and is agreeable to continue with [facility] services at discharge if SNF placement is unavailable.    Expected Discharge Plan: Home/Self Care Barriers to Discharge: Continued Medical Work up  Expected Discharge Plan and Services Expected Discharge Plan: Home/Self Care In-house Referral: Clinical Social Work     Living arrangements for the past 2 months: Single Family Home                 DME Arranged: N/A DME Agency: NA                   Social Determinants of Health (SDOH) Interventions    Readmission Risk Interventions    07/11/2021    1:22 PM 02/22/2021   12:36 PM  Readmission Risk Prevention Plan  Transportation Screening Complete Complete  PCP or Specialist Appt within 3-5 Days  Complete  HRI or [place]  Complete  Social Work Consult for [place] Planning/Counseling  Complete  Palliative Care Screening  Complete  Medication Review Press photographer) Complete Complete  HRI or Home Care Consult Complete   SW Recovery Care/Counseling Consult Complete   Palliative Care Screening Not Applicable   Skilled Nursing Facility Complete

## 2021-07-16 NOTE — Progress Notes (Signed)
PROGRESS NOTE  Lori [name] EYC:[id number] DOB: May 13, 1970 DOA: 07/10/2021 PCP: [name], NP  HPI/Recap of past 40 hours: 51 year old with past medical history significant for type 2 diabetes, history of CVA on Plavix, CAD, depression who presented to the ED referred by her PCP's office due to severe nausea vomiting and dizziness.  Noncontrast CT abdomen and pelvis was unremarkable.  CT head was nonacute.  Patient was admitted for HHS/gastroparesis/dehydration/tachycardia/aspiration/pneumonia/mild pancreatitis.   The patient is clinically improving but remains deconditioned.  PT OT has recommended SNF.  Medically cleared for discharge.  Awaiting SNF placement.   07/16/21: No new complaints.    Assessment/Plan: Principal Problem:   Hyperosmolar hyperglycemic state (HHS) ([place]) Active Problems:   Tachycardia   Anxiety and depression   Weakness   History of cerebral infarction   Pseudohyponatremia   Thyroid disease  Resolved posttreatment: Hyperosmolar hyperglycemic state Last hemoglobin A1c 8.4 on 04/24/2021. Continue basal/bolus/insulin sliding scale.  Resolved pancreatitis mild Supportive management.  Diet as tolerated.    Resolved tachycardia  Resume home Lopressor   Aspiration pneumonia: Continue Rocephin and aspiration precautions. Complete 5 days   Chronic dysphagia  speech following diet as above.  Continue aspiration precaution  Hyperthyroidism continue methimazole History of CVA continue Plavix and statin Generalized weakness/debility continue PT OT, recommending SNF.  TOC consulted to assist with DC planning. Leukocytosis/pyuria currently on IV antibiotics urine culture NGTD Hypomagnesemia/hypokalemia replete electrolytes as indicated.  Constipation: Bowel regimen in place.   PICC in place   DVT prophylaxis: enoxaparin (LOVENOX) injection 40 mg Start: 07/10/21 1800 SCDs Start: 07/10/21 1521 Code Status:   Code Status: Full Code Family  Communication: plan of care discussed with patient/husband at bedside. Patient status is: inpatient  because of pending placement Level of care: Telemetry    Dispo: The patient is from: home            Anticipated disposition: SNF once available.     Status is: Inpatient The patient requires at least 2 midnights for further evaluation and treatment of present condition.    Objective: Vitals:   07/15/21 2300 07/16/21 0609 07/16/21 1200 07/16/21 1400  BP:  131/90 (!) 151/112 136/84  Pulse:  96 (!) 108 95  Resp:  18  18  Temp:  98.9 F (37.2 C)  98.3 F (36.8 C)  TempSrc:  Oral  Oral  SpO2:  99%  99%  Weight: 92.5 kg     Height: '5\' 2"'$  (1.575 m)       Intake/Output Summary (Last 24 hours) at 07/16/2021 1729 Last data filed at 07/16/2021 1500 Gross per 24 hour  Intake 2777.67 ml  Output 700 ml  Net 2077.67 ml   Filed Weights   07/15/21 2300  Weight: 92.5 kg    Exam: No significant changes from prior exam.  General: 51 y.o. year-old female well developed well nourished in no acute distress.  Alert and oriented x3. Cardiovascular: Regular rate and rhythm with no rubs or gallops.  No thyromegaly or JVD noted.   Respiratory: Clear to auscultation with no wheezes or rales. Good inspiratory effort. Abdomen: Soft nontender nondistended with normal bowel sounds x4 quadrants.   Musculoskeletal: Trace lower extremity edema.  Left upper extremity contracture. Skin: No ulcerative lesions noted or rashes, Psychiatry: Mood is appropriate for condition and setting   Data Reviewed: CBC: Recent Labs  Lab 07/10/21 1222 07/10/21 1233 07/11/21 1230 07/13/21 0324  WBC  --  22.8* 14.9* 10.4  NEUTROABS  --  19.9*  --   --  HGB 16.7* 14.5 11.0* 10.0*  HCT 49.0* 43.0 32.7* 30.0*  MCV  --  77.3* 77.7* 77.3*  PLT  --  364 280 629   Basic Metabolic Panel: Recent Labs  Lab 07/11/21 0300 07/11/21 1230 07/12/21 0332 07/13/21 0324 07/14/21 0355 07/15/21 0332  NA 135 134* 135  136 134* 134*  K 3.1* 3.6 3.3* 3.2* 3.8 3.8  CL 99 100 100 103 105 106  CO2 '26 25 26 26 24 '$ 20*  GLUCOSE 155* 250* 212* 127* 123* 182*  BUN '12 9 7 6 7 7  '$ CREATININE 0.54 0.59 0.49 0.55 0.56 0.63  CALCIUM 8.0* 8.1* 7.6* 8.0* 8.3* 8.2*  MG 1.4*  --  1.7  --   --   --    GFR: Estimated Creatinine Clearance: 91.1 mL/min (by C-G formula based on SCr of 0.63 mg/dL). Liver Function Tests: Recent Labs  Lab 07/10/21 1233  AST 13*  ALT 14  ALKPHOS 116  BILITOT 1.2  PROT 8.0  ALBUMIN 3.5   Recent Labs  Lab 07/10/21 1233 07/11/21 1230  LIPASE 110* 42   No results for input(s): "AMMONIA" in the last 168 hours. Coagulation Profile: No results for input(s): "INR", "PROTIME" in the last 168 hours. Cardiac Enzymes: No results for input(s): "CKTOTAL", "CKMB", "CKMBINDEX", "TROPONINI" in the last 168 hours. BNP (last 3 results) No results for input(s): "PROBNP" in the last 8760 hours. HbA1C: No results for input(s): "HGBA1C" in the last 72 hours. CBG: Recent Labs  Lab 07/15/21 1647 07/15/21 2131 07/16/21 0754 07/16/21 1255 07/16/21 1718  GLUCAP 170* 201* 218* 299* 170*   Lipid Profile: No results for input(s): "CHOL", "HDL", "LDLCALC", "TRIG", "CHOLHDL", "LDLDIRECT" in the last 72 hours. Thyroid Function Tests: No results for input(s): "TSH", "T4TOTAL", "FREET4", "T3FREE", "THYROIDAB" in the last 72 hours. Anemia Panel: No results for input(s): "VITAMINB12", "FOLATE", "FERRITIN", "TIBC", "IRON", "RETICCTPCT" in the last 72 hours. Urine analysis:    Component Value Date/Time   COLORURINE YELLOW 07/10/2021 1841   APPEARANCEUR HAZY (A) 07/10/2021 1841   LABSPEC 1.022 07/10/2021 1841   PHURINE 5.0 07/10/2021 1841   GLUCOSEU >=500 (A) 07/10/2021 1841   HGBUR MODERATE (A) 07/10/2021 1841   BILIRUBINUR NEGATIVE 07/10/2021 1841   KETONESUR NEGATIVE 07/10/2021 1841   PROTEINUR NEGATIVE 07/10/2021 1841   NITRITE NEGATIVE 07/10/2021 1841   LEUKOCYTESUR MODERATE (A) 07/10/2021 1841    Sepsis Labs: '@LABRCNTIP'$ (procalcitonin:4,lacticidven:4)  ) Recent Results (from the past 240 hour(s))  MRSA Next Gen by PCR, Nasal     Status: None   Collection Time: 07/10/21  7:55 PM   Specimen: Nasal Mucosa; Nasal Swab  Result Value Ref Range Status   MRSA by PCR Next Gen NOT DETECTED NOT DETECTED Final    Comment: (NOTE) The GeneXpert MRSA Assay (FDA approved for NASAL specimens only), is one component of a comprehensive MRSA colonization surveillance program. It is not intended to diagnose MRSA infection nor to guide or monitor treatment for MRSA infections. Test performance is not FDA approved in patients less than 39 years old. Performed at [facility], [place] [address], [place], [place] [address]   Urine Culture     Status: None   Collection Time: 07/11/21  2:38 PM   Specimen: Urine, Clean Catch  Result Value Ref Range Status   Specimen Description   Final    URINE, CLEAN CATCH Performed at [facility], [place] [address] [place], [place] [address]    Special Requests   Final  NONE Performed at [facility], [place] [address] [place], [place] [address]    Culture   Final    NO GROWTH Performed at [facility], [place] [address], [place],  [address]    Report Status 07/12/2021 FINAL  Final      Studies: No results found.  Scheduled Meds:  Chlorhexidine Gluconate Cloth  6 each Topical Daily   clopidogrel  75 mg Oral Daily   enoxaparin (LOVENOX) injection  40 mg Subcutaneous Q24H   escitalopram  20 mg Oral Daily   ezetimibe  10 mg Oral Daily   feeding supplement (GLUCERNA SHAKE)  237 mL Oral TID BM   insulin aspart  0-15 Units Subcutaneous TID WC   insulin aspart  0-5 Units Subcutaneous QHS   insulin aspart  2 Units Subcutaneous TID WC   insulin glargine-yfgn  20 Units Subcutaneous QHS   LORazepam  0.5 mg Oral QHS   magic mouthwash  5 mL Oral TID   mouth rinse  15 mL Mouth Rinse BID    methimazole  5 mg Oral Daily   metoCLOPramide (REGLAN) injection  10 mg Intravenous Q8H   metoprolol tartrate  50 mg Oral BID   nystatin   Topical TID   pantoprazole  40 mg Oral Daily   rosuvastatin  40 mg Oral Daily   senna-docusate  2 tablet Oral BID   sodium chloride flush  10-40 mL Intracatheter Q12H   topiramate  25 mg Oral BID    Continuous Infusions:  sodium chloride 75 mL/hr at 07/16/21 1146   cefTRIAXone (ROCEPHIN)  IV 2 g (07/16/21 1146)     LOS: 6 days     Kayleen Memos, MD [facility] Pager [phone]  If 7PM-7AM, please contact night-coverage [email or web address] Password [facility] 07/16/2021, 5:29 PM

## 2021-07-16 NOTE — Progress Notes (Signed)
Physical Therapy Treatment Patient Details Name: Lori [name] MRN: [id number] DOB: 1970-02-11 Today's Date: 07/16/2021   History of Present Illness 51 year old with past medical history significant for diabetes, history of CVA, CAD, depression who presented to the ED referred by her PCP office due to severe nausea vomiting and dizziness.  She was found to have high blood sugar and high blood pressure. Patient admitted for Hyperosmolar hyperglycemic syndrome    PT Comments    Pt requesting to use bathroom so assisted pt with ambulating into bathroom.  Pt requires increased assist for bed mobility however able to ambulate with min assist.    Recommendations for follow up therapy are one component of a multi-disciplinary discharge planning process, led by the attending physician.  Recommendations may be updated based on patient status, additional functional criteria and insurance authorization.  Follow Up Recommendations  Skilled nursing-short term rehab (<3 hours/day)     Assistance Recommended at Discharge Frequent or constant Supervision/Assistance  Patient can return home with the following Assistance with cooking/housework;Assist for transportation;Help with stairs or ramp for entrance;Direct supervision/assist for medications management;Direct supervision/assist for financial management;A little help with walking and/or transfers   Equipment Recommendations  None recommended by PT    Recommendations for Other Services       Precautions / Restrictions Precautions Precautions: Fall Precaution Comments: left sided hemiparesis Restrictions Weight Bearing Restrictions: No     Mobility  Bed Mobility Overal bed mobility: Needs Assistance Bed Mobility: Supine to Sit, Sit to Supine     Supine to sit: Min assist, HOB elevated     General bed mobility comments: assist for trunk upright    Transfers Overall transfer level: Needs assistance Equipment used: Rolling walker  (2 wheels) Transfers: Sit to/from Stand Sit to Stand: Min assist           General transfer comment: light assist to rise, increased time and effort required for all mobility    Ambulation/Gait Ambulation/Gait assistance: Min guard, Min assist Gait Distance (Feet): 8 Feet Assistive device: Rolling walker (2 wheels) Gait Pattern/deviations: Step-to pattern, Decreased stance time - left       General Gait Details: initially min assist however progress to min/guard, pt very slow with gait; pt requested ambulating to bathroom   Stairs             Wheelchair Mobility    Modified Rankin (Stroke Patients Only)       Balance Overall balance assessment: Needs assistance         Standing balance support: During functional activity, Reliant on assistive device for balance Standing balance-Leahy Scale: Poor                              Cognition Arousal/Alertness: Awake/alert Behavior During Therapy: WFL for tasks assessed/performed Overall Cognitive Status: Within Functional Limits for tasks assessed                                          Exercises      General Comments        Pertinent Vitals/Pain Pain Assessment Pain Assessment: No/denies pain    Home Living                          Prior Function  PT Goals (current goals can now be found in the care plan section) Progress towards PT goals: Progressing toward goals    Frequency    Min 2X/week      PT Plan Current plan remains appropriate    Co-evaluation              AM-PAC PT "6 Clicks" Mobility   Outcome Measure  Help needed turning from your back to your side while in a flat bed without using bedrails?: A Lot Help needed moving from lying on your back to sitting on the side of a flat bed without using bedrails?: A Lot Help needed moving to and from a bed to a chair (including a wheelchair)?: A Lot Help needed standing up  from a chair using your arms (e.g., wheelchair or bedside chair)?: A Lot Help needed to walk in hospital room?: A Lot Help needed climbing 3-5 steps with a railing? : Total 6 Click Score: 11    End of Session Equipment Utilized During Treatment: Gait belt Activity Tolerance: Patient tolerated treatment well Patient left: Other (comment) (in bathroom aware to use call bell and repeated this back to therapist, pt requested time to use bathroom, RN also aware)   PT Visit Diagnosis: History of falling (Z91.81);Difficulty in walking, not elsewhere classified (R26.2)     Time: 1206-1220 PT Time Calculation (min) (ACUTE ONLY): 14 min  Charges:  $Gait Training: 8-22 mins                     Jannette Spanner PT, DPT Acute Rehabilitation Services Pager: [phone] Office: [place] 07/16/2021, 3:44 PM

## 2021-07-16 NOTE — Progress Notes (Signed)
Speech Language Pathology Treatment: Dysphagia  Patient Details Name: Lori [name] MRN: [id number] DOB: February 11, 1970 Today's Date: 07/16/2021 Time: 8413-2440 SLP Time Calculation (min) (ACUTE ONLY): 25 min  Assessment / Plan / Recommendation Clinical Impression  Patient seen by SLP for skilled treatment session focused on dysphagia goals. When SLP arrived, NT was preparing to assist patient with breakfast meal setup. Patient was initially requesting to sit at the edge of the bed but SLP and NT both informed her that as per recommendations/therapy notes this was not advised unless with PT and/or OT. SLP provided patient with setup assist with meal and RN helped with repositioning her higher up in bed. Patient was able to feed her self, exhibiting prolonged mastication, trace anterior spillage of solids on left side, trace to mild PO residuals s/p initial swallow. She exhibited 2 instances (out of approximately 6 sips) of immediate cough response with thin liquids via straw sip. At end of session, patient asked SLP "How do you think I did with my swallowing today?" SLP discussed what he observed and also discussed previous recommendations for nectar thick liquids. Patient continues to report she does not wish for that. She then asked SLP if the "restrictions on carbonated drinks" could be lifted. SLP informed patient that current diet order does not restrict these drinks. SLP is recommending continue on Dys 3, thin liquids diet. SLP will continue to follow acutely secondary to patient still exhibiting coughing after drinking thin liquids.    HPI HPI: Per MD, "Lori [name] is a 51 y.o. female with medical history significant of DM, h.o CVA, CAD and depression who was sent to the ER from her PCP's office with severe N/V and dizziness.  She has been vomiting since Thursday.  She fell on Sunday."  Chest imaging concerning for opacity.  MRI showed remote cerebellar infarct and prior basal ganglia CVA.   Pt underwent MBS during hospital admit 01/2021 showing trace asp of thin via cup/straw - not tsp.  Various compensations attempts without effectiveness - and chin tuck WORSENED aiwary protection.  Pt reports she is consuming regular/thin diet at home and takes her pills whole with applesauce.      SLP Plan  Continue with current plan of care      Recommendations for follow up therapy are one component of a multi-disciplinary discharge planning process, led by the attending physician.  Recommendations may be updated based on patient status, additional functional criteria and insurance authorization.    Recommendations  Diet recommendations: Dysphagia 3 (mechanical soft);Thin liquid Liquids provided via: Cup;Straw Medication Administration: Whole meds with puree Supervision: Intermittent supervision to cue for compensatory strategies;Patient able to self feed Compensations: Slow rate;Small sips/bites;Follow solids with liquid;Monitor for anterior loss;Lingual sweep for clearance of pocketing Postural Changes and/or Swallow Maneuvers: Seated upright 90 degrees;Upright 30-60 min after meal                Oral Care Recommendations: Oral care BID Follow Up Recommendations: Home health SLP Assistance recommended at discharge: Frequent or constant Supervision/Assistance SLP Visit Diagnosis: Dysphagia, oropharyngeal phase (R13.12) Plan: Continue with current plan of care         Sonia Baller, MA, CCC-SLP Speech Therapy

## 2021-07-17 DIAGNOSIS — E1100 Type 2 diabetes mellitus with hyperosmolarity without nonketotic hyperglycemic-hyperosmolar coma (NKHHC): Secondary | ICD-10-CM | POA: Diagnosis not present

## 2021-07-17 LAB — GLUCOSE, CAPILLARY
Glucose-Capillary: 196 mg/dL — ABNORMAL HIGH (ref 70–99)
Glucose-Capillary: 265 mg/dL — ABNORMAL HIGH (ref 70–99)
Glucose-Capillary: 245 mg/dL — ABNORMAL HIGH (ref 70–99)
Glucose-Capillary: 168 mg/dL — ABNORMAL HIGH (ref 70–99)

## 2021-07-17 LAB — CREATININE, SERUM
Creatinine, Ser: 0.58 mg/dL (ref 0.44–1.00)
GFR, Estimated: >60 mL/min (ref >60)

## 2021-07-17 MED ORDER — ONDANSETRON HCL 4 MG PO TABS: 4.0000 mg | ORAL_TABLET | Freq: Three times a day (TID) | ORAL | Status: DC | PRN

## 2021-07-17 MED ORDER — GABAPENTIN 100 MG PO CAPS
100.0000 mg | ORAL_CAPSULE | Freq: Three times a day (TID) | ORAL | Status: DC
  Administered 2021-07-17 – 2021-07-18 (×4): 100 mg via ORAL
  Filled 2021-07-17 (×4): qty 1

## 2021-07-17 MED ORDER — BUTALBITAL-APAP-CAFFEINE 50-325-40 MG PO TABS
1.0000 | ORAL_TABLET | Freq: Four times a day (QID) | ORAL | Status: DC | PRN
  Administered 2021-07-17 – 2021-07-18 (×2): 1 via ORAL
  Filled 2021-07-17 (×2): qty 1

## 2021-07-17 MED ORDER — HYDROXYZINE HCL 25 MG PO TABS
25.0000 mg | ORAL_TABLET | Freq: Every day | ORAL | Status: DC
  Administered 2021-07-17: 25 mg via ORAL
  Filled 2021-07-17: qty 1

## 2021-07-17 MED ORDER — MELATONIN 5 MG PO TABS
5.0000 mg | ORAL_TABLET | Freq: Every evening | ORAL | Status: DC | PRN
  Administered 2021-07-17: 5 mg via ORAL
  Filled 2021-07-17: qty 1

## 2021-07-17 MED ORDER — MELATONIN 3 MG PO TABS: 9.0000 mg | ORAL_TABLET | Freq: Every day | ORAL | Status: DC

## 2021-07-17 MED ORDER — ONDANSETRON HCL 4 MG PO TABS: 4.0000 mg | ORAL_TABLET | Freq: Three times a day (TID) | ORAL | 0 refills | Status: DC | PRN

## 2021-07-17 MED ORDER — METOCLOPRAMIDE HCL 5 MG PO TABS
10.0000 mg | ORAL_TABLET | Freq: Three times a day (TID) | ORAL | Status: DC
  Administered 2021-07-17 – 2021-07-18 (×3): 10 mg via ORAL
  Filled 2021-07-17 (×3): qty 2

## 2021-07-17 MED ORDER — TAMSULOSIN HCL 0.4 MG PO CAPS
0.4000 mg | ORAL_CAPSULE | Freq: Every day | ORAL | Status: DC
  Administered 2021-07-17: 0.4 mg via ORAL
  Filled 2021-07-17: qty 1

## 2021-07-17 MED ORDER — ONDANSETRON HCL 4 MG PO TABS
4.0000 mg | ORAL_TABLET | Freq: Four times a day (QID) | ORAL | Status: DC
Start: 2021-07-17 — End: 2021-07-17

## 2021-07-17 NOTE — Progress Notes (Signed)
PROGRESS NOTE  Lori [name] IOX:[id number] DOB: 1970/08/27 DOA: 07/10/2021 PCP: [name], NP  HPI/Recap of past 22 hours: 51 year old with past medical history significant for type 2 diabetes, history of CVA on Plavix, CAD, chronic anxiety/depression, migraine headache, who presented to the ED referred by her PCP due to severe nausea vomiting and dizziness.  Noncontrast CT head and noncontrast CT abdomen and pelvis were non acute.  The patient was admitted for HHS/gastroparesis/dehydration/tachycardia/aspiration pneumonia and mild pancreatitis.   The patient is medically cleared for discharge.  Updated the patient's spouse via phone on 07/17/2021.  States he is currently at work until 7 PM and that he will be able to assist his wife at home from 07/18/2021 AM.   07/17/21: The patient has no new complaints.    Assessment/Plan: Principal Problem:   Hyperosmolar hyperglycemic state (HHS) ([place]) Active Problems:   Tachycardia   Anxiety and depression   Weakness   History of cerebral infarction   Pseudohyponatremia   Thyroid disease  Resolved posttreatment: Hyperosmolar hyperglycemic state Last hemoglobin A1c 8.4 on 04/24/2021. Continue home regimen Follow-up with your primary care provider  Resolved pancreatitis mild Supportive management.  Diet as tolerated.    Resolved tachycardia  Resume home Lopressor  Resolved leukocytosis Cultures are negative to date   Aspiration pneumonia: Completed 5 days of Rocephin Continue aspiration precautions.   Chronic dysphagia  speech following diet as above Speech therapist has recommended dysphagia 3 diet, which is a mechanical soft diet with thin liquid.  Liquids provided via cup and straw.  Medication administration whole meds with pure.  Hyperthyroidism continue home methimazole History of CVA continue home Plavix and statin Generalized weakness/debility continue PT OT with assistance and fall precautions.  Resolved  hypomagnesemia/hypokalemia replete electrolytes as indicated.  Constipation: Bowel regimen in place.   PICC in place: Remove PICC line on 07/17/2021 in anticipation for discharge on 07/18/2021 AM.   DVT prophylaxis: Subcu Lovenox daily Code Status:   Code Status: Full Code Family Communication: Updated her husband via phone on 07/17/2021. Patient status is: inpatient  because of pending placement Level of care: Telemetry    Dispo: The patient is from: home            Anticipated disposition: Home with home health services on 07/19/2018 3 in the morning.     Status is: Inpatient The patient requires at least 2 midnights for further evaluation and treatment of present condition.    Objective: Vitals:   07/16/21 1200 07/16/21 1400 07/16/21 2101 07/17/21 0429  BP: (!) 151/112 136/84 134/89 (!) 138/95  Pulse: (!) 108 95 (!) 110 99  Resp:  '18 20 20  '$ Temp:  98.3 F (36.8 C) 99.6 F (37.6 C) 98.6 F (37 C)  TempSrc:  Oral Oral   SpO2:  99% 100% 99%  Weight:      Height:        Intake/Output Summary (Last 24 hours) at 07/17/2021 1056 Last data filed at 07/17/2021 0600 Gross per 24 hour  Intake 1744.52 ml  Output 300 ml  Net 1444.52 ml   Filed Weights   07/15/21 2300  Weight: 92.5 kg    Exam:  General: 51 y.o. year-old female well-developed well-nourished in no acute distress.  She is alert and interactive.  Speech impediment.   Cardiovascular: Regular rate and rhythm no rubs or gallops.   Respiratory: Clear to auscultation with no wheezes or rales.   Abdomen: Soft nontender with normal bowel sounds present. Musculoskeletal: Trace  lower extremity edema. Psychiatry: Mood is appropriate for condition and setting.   Data Reviewed: CBC: Recent Labs  Lab 07/10/21 1222 07/10/21 1233 07/11/21 1230 07/13/21 0324  WBC  --  22.8* 14.9* 10.4  NEUTROABS  --  19.9*  --   --   HGB 16.7* 14.5 11.0* 10.0*  HCT 49.0* 43.0 32.7* 30.0*  MCV  --  77.3* 77.7* 77.3*  PLT  --  364  280 270   Basic Metabolic Panel: Recent Labs  Lab 07/11/21 0300 07/11/21 1230 07/12/21 0332 07/13/21 0324 07/14/21 0355 07/15/21 0332 07/17/21 0341  NA 135 134* 135 136 134* 134*  --   K 3.1* 3.6 3.3* 3.2* 3.8 3.8  --   CL 99 100 100 103 105 106  --   CO2 '26 25 26 26 24 '$ 20*  --   GLUCOSE 155* 250* 212* 127* 123* 182*  --   BUN '12 9 7 6 7 7  '$ --   CREATININE 0.54 0.59 0.49 0.55 0.56 0.63 0.58  CALCIUM 8.0* 8.1* 7.6* 8.0* 8.3* 8.2*  --   MG 1.4*  --  1.7  --   --   --   --    GFR: Estimated Creatinine Clearance: 91.1 mL/min (by C-G formula based on SCr of 0.58 mg/dL). Liver Function Tests: Recent Labs  Lab 07/10/21 1233  AST 13*  ALT 14  ALKPHOS 116  BILITOT 1.2  PROT 8.0  ALBUMIN 3.5   Recent Labs  Lab 07/10/21 1233 07/11/21 1230  LIPASE 110* 42   No results for input(s): "AMMONIA" in the last 168 hours. Coagulation Profile: No results for input(s): "INR", "PROTIME" in the last 168 hours. Cardiac Enzymes: No results for input(s): "CKTOTAL", "CKMB", "CKMBINDEX", "TROPONINI" in the last 168 hours. BNP (last 3 results) No results for input(s): "PROBNP" in the last 8760 hours. HbA1C: No results for input(s): "HGBA1C" in the last 72 hours. CBG: Recent Labs  Lab 07/16/21 0754 07/16/21 1255 07/16/21 1718 07/16/21 2025 07/17/21 0758  GLUCAP 218* 299* 170* 139* 196*   Lipid Profile: No results for input(s): "CHOL", "HDL", "LDLCALC", "TRIG", "CHOLHDL", "LDLDIRECT" in the last 72 hours. Thyroid Function Tests: No results for input(s): "TSH", "T4TOTAL", "FREET4", "T3FREE", "THYROIDAB" in the last 72 hours. Anemia Panel: No results for input(s): "VITAMINB12", "FOLATE", "FERRITIN", "TIBC", "IRON", "RETICCTPCT" in the last 72 hours. Urine analysis:    Component Value Date/Time   COLORURINE YELLOW 07/10/2021 1841   APPEARANCEUR HAZY (A) 07/10/2021 1841   LABSPEC 1.022 07/10/2021 1841   PHURINE 5.0 07/10/2021 1841   GLUCOSEU >=500 (A) 07/10/2021 1841   HGBUR  MODERATE (A) 07/10/2021 1841   BILIRUBINUR NEGATIVE 07/10/2021 1841   KETONESUR NEGATIVE 07/10/2021 1841   PROTEINUR NEGATIVE 07/10/2021 1841   NITRITE NEGATIVE 07/10/2021 1841   LEUKOCYTESUR MODERATE (A) 07/10/2021 1841   Sepsis Labs: '@LABRCNTIP'$ (procalcitonin:4,lacticidven:4)  ) Recent Results (from the past 240 hour(s))  MRSA Next Gen by PCR, Nasal     Status: None   Collection Time: 07/10/21  7:55 PM   Specimen: Nasal Mucosa; Nasal Swab  Result Value Ref Range Status   MRSA by PCR Next Gen NOT DETECTED NOT DETECTED Final    Comment: (NOTE) The GeneXpert MRSA Assay (FDA approved for NASAL specimens only), is one component of a comprehensive MRSA colonization surveillance program. It is not intended to diagnose MRSA infection nor to guide or monitor treatment for MRSA infections. Test performance is not FDA approved in patients less than 39 years old. Performed at [facility]  [place] [address] [place], [place] [address]   Urine Culture     Status: None   Collection Time: 07/11/21  2:38 PM   Specimen: Urine, Clean Catch  Result Value Ref Range Status   Specimen Description   Final    URINE, CLEAN CATCH Performed at [facility], [place] [address], [place], [place] [address]    Special Requests   Final    NONE Performed at [facility], [place] [address] [place], [place] [address]    Culture   Final    NO GROWTH Performed at [facility], [place] [address] [place], [place] [address]    Report Status 07/12/2021 FINAL  Final      Studies: No results found.  Scheduled Meds:  Chlorhexidine Gluconate Cloth  6 each Topical Daily   clopidogrel  75 mg Oral Daily   enoxaparin (LOVENOX) injection  40 mg Subcutaneous Q24H   escitalopram  20 mg Oral Daily   ezetimibe  10 mg Oral Daily   feeding supplement (GLUCERNA SHAKE)  237 mL Oral TID BM   gabapentin  100 mg Oral TID   hydrOXYzine  25 mg Oral QHS   insulin aspart   0-15 Units Subcutaneous TID WC   insulin aspart  0-5 Units Subcutaneous QHS   insulin aspart  2 Units Subcutaneous TID WC   insulin glargine-yfgn  20 Units Subcutaneous QHS   LORazepam  0.5 mg Oral QHS   magic mouthwash  5 mL Oral TID   mouth rinse  15 mL Mouth Rinse BID   methimazole  5 mg Oral Daily   metoCLOPramide  10 mg Oral TID AC   metoprolol tartrate  50 mg Oral BID   nystatin   Topical TID   pantoprazole  40 mg Oral Daily   rosuvastatin  40 mg Oral Daily   senna-docusate  2 tablet Oral BID   sodium chloride flush  10-40 mL Intracatheter Q12H   tamsulosin  0.4 mg Oral QHS   topiramate  25 mg Oral BID    Continuous Infusions:     LOS: 7 days     Kayleen Memos, MD [facility] Pager [phone]  If 7PM-7AM, please contact night-coverage [email or web address] Password [facility] 07/17/2021, 10:56 AM

## 2021-07-17 NOTE — Plan of Care (Signed)

## 2021-07-18 DIAGNOSIS — F32A Depression, unspecified: Secondary | ICD-10-CM

## 2021-07-18 DIAGNOSIS — F419 Anxiety disorder, unspecified: Secondary | ICD-10-CM | POA: Diagnosis not present

## 2021-07-18 DIAGNOSIS — E1100 Type 2 diabetes mellitus with hyperosmolarity without nonketotic hyperglycemic-hyperosmolar coma (NKHHC): Secondary | ICD-10-CM | POA: Diagnosis not present

## 2021-07-18 DIAGNOSIS — R112 Nausea with vomiting, unspecified: Secondary | ICD-10-CM | POA: Diagnosis not present

## 2021-07-18 LAB — GLUCOSE, CAPILLARY
Glucose-Capillary: 138 mg/dL — ABNORMAL HIGH (ref 70–99)
Glucose-Capillary: 189 mg/dL — ABNORMAL HIGH (ref 70–99)

## 2021-07-18 NOTE — TOC Transition Note (Signed)
Transition of Care [facility]) - CM/SW Discharge Note   Patient Details  Name: Lori [name] MRN: [id number] Date of Birth: 11-08-1970  Transition of [facility]) CM/SW Contact:  Vassie Moselle, LCSW Phone Number: 07/18/2021, 12:46 PM   Clinical Narrative:    Pt is to discharge home with HHPT/OT/RN services. These services have been confirmed with Caryl Pina at [place] and are to resume once pt is discharged. [facility] has been arranged to provide transportation home for this pt. No further TOC needs.    Final next level of care: [place] Barriers to Discharge: Barriers Resolved   Patient Goals and CMS Choice Patient states their goals for this hospitalization and ongoing recovery are:: Get approved for additional hours through the CAP program   Choice offered to / list presented to : Patient  Discharge Placement                       Discharge Plan and Services In-house Referral: Clinical Social Work              DME Arranged: N/A DME Agency: NA       HH Arranged: RN, PT, OT HH Agency: [place] ([place]) Date [place]: 07/11/21 Time HH Agency Contacted: 1500 Representative spoke with at [place] [place] ([place]) Interventions     Readmission Risk Interventions    07/18/2021   12:43 PM 07/11/2021    1:22 PM 02/22/2021   12:36 PM  Readmission Risk Prevention Plan  Transportation Screening Complete Complete Complete  PCP or Specialist Appt within 3-5 Days   Complete  HRI or [place]   Complete  Social Work Consult for [place] Planning/Counseling   Complete  Palliative Care Screening   Complete  Medication Review [employer]) Complete Complete Complete  PCP or Specialist appointment within 3-5 days of discharge Complete    HRI or Home Care Consult Complete Complete   SW Recovery Care/Counseling Consult Complete Complete   Palliative Care Screening Not Applicable Not  Applicable   Skilled Nursing Facility Complete Complete

## 2021-07-18 NOTE — Plan of Care (Signed)

## 2021-07-18 NOTE — Discharge Summary (Signed)
Physician Discharge Summary   Patient: Lori [name] MRN: [id number] DOB: 09-02-1970  Admit date:     07/10/2021  Discharge date: 07/18/21  Discharge Physician: Marylu Lund   PCP: [name], NP   Recommendations at discharge:    Follow up with PCP as scheduled  Discharge Diagnoses: Principal Problem:   Hyperosmolar hyperglycemic state (HHS) ([place]) Active Problems:   Tachycardia   Anxiety and depression   Weakness   History of cerebral infarction   Pseudohyponatremia   Thyroid disease  Resolved Problems:   * No resolved hospital problems. *  Hospital Course: 51 year old with past medical history significant for diabetes, history of CVA, CAD, depression who presented to the ED referred by her PCP office due to severe nausea vomiting and dizziness.  She was found to have high blood sugar and high blood pressure.  She has not been able to take her Lantus in 24 hours.At baseline she has contractures and weakness in her left hemibody.  More pronounced in the upper extremity left.  She is not able to walk at baseline.  She lives at home and has nursing care for 3 hours a day.  Work-up with CT abdomen and pelvis is nonremarkable.  CT head no acute intracranial abnormality.  Remote left cerebellar infarct, chronic basal ganglia lacunar infarct and age advanced chronic small vessel ischemia.Patient admitted for HHS/gastroparesis/dehydration tachycardia/aspiration pneumonia mild pancreatitis and being managed with with IV fluids,insulin, Reglan.  Overall patient has been clinically improving remains deconditioned weak PT OT recommending skilled nursing facility placement.  Speech therapy  working closely.    Assessment and Plan: No notes have been filed under this hospital service. Service: Hospitalist  Resolved posttreatment: Hyperosmolar hyperglycemic state Last hemoglobin A1c 8.4 on 04/24/2021. Continue home regimen Follow-up with your primary care provider   Resolved  pancreatitis mild Supportive management.  Diet as tolerated.    Resolved tachycardia  Resume home Lopressor   Resolved leukocytosis Cultures are negative to date   Aspiration pneumonia: Completed 5 days of Rocephin Continue aspiration precautions.   Chronic dysphagia  Speech was consulted Speech therapist has recommended dysphagia 3 diet, which is a mechanical soft diet with thin liquid.  Liquids provided via cup and straw.  Medication administration whole meds with pure.   Hyperthyroidism continue home methimazole History of CVA continue home Plavix and statin   Resolved hypomagnesemia/hypokalemia replete electrolytes as indicated.   Constipation: Bowel regimen in place.       Consultants:  Procedures performed:   Disposition: Home health Diet recommendation:  Dysphagia 3 with thin liquids DISCHARGE MEDICATION: Allergies as of 07/18/2021       Reactions   Sumatriptan Anaphylaxis   Contrast Media [iodinated Contrast Media] Swelling   Penicillins Swelling   Mouth swells up and eyes swollen shut        Medication List     STOP taking these medications    amLODipine 10 MG tablet Commonly known as: Norvasc   fluconazole 100 MG tablet Commonly known as: Diflucan   lisinopril 20 MG tablet Commonly known as: ZESTRIL       TAKE these medications    acetaminophen 325 MG tablet Commonly known as: TYLENOL Take 2 tablets (650 mg total) by mouth every 6 (six) hours as needed for mild pain or headache.   atorvastatin 80 MG tablet Commonly known as: LIPITOR Take 80 mg by mouth daily.   BD Pen Needle Nano U/F 32G X 4 MM Misc Generic drug: Insulin Pen Needle Use  to inject insulin 4 times daily as directed.   blood glucose meter kit and supplies Kit Dispense based on patient and insurance preference. Use up to four times daily as directed.   butalbital-acetaminophen-caffeine 50-325-40 MG tablet Commonly known as: FIORICET TAKE 1 TABLET BY MOUTH EVERY 6  HOURS AS NEEDED FOR MIGRANE What changed: See the new instructions.   clopidogrel 75 MG tablet Commonly known as: PLAVIX TAKE 1 TABLET BY MOUTH ONCE DAILY   diclofenac Sodium 1 % Gel Commonly known as: VOLTAREN Apply 4 g topically 4 (four) times daily.   empagliflozin 10 MG Tabs tablet Commonly known as: Jardiance Take 1 tablet (10 mg total) by mouth daily before breakfast.   escitalopram 20 MG tablet Commonly known as: LEXAPRO TAKE 1 TABLET BY MOUTH EVERY MORNING What changed: when to take this   ezetimibe 10 MG tablet Commonly known as: Zetia Take 1 tablet (10 mg total) by mouth daily.   FreeStyle Libre 2 Reader [place] Use to check blood sugar 3 times daily.   FreeStyle Libre 2 Sensor Misc Use to check blood sugar 3 times daily.   gabapentin 100 MG capsule Commonly known as: NEURONTIN TAKE 1 CAPSULE BY MOUTH THREE TIMES DAILY (BREAKFAST, LUNCH, BEDTIME) What changed: See the new instructions.   hydrOXYzine 25 MG tablet Commonly known as: ATARAX Take 1 tablet (25 mg total) by mouth at bedtime.   Insulin Aspart FlexPen 100 UNIT/ML Commonly known as: NOVOLOG ADMINISTER 4 UNITS UNDER THE SKIN THREE TIMES DAILY WITH MEALS What changed: See the new instructions.   Lantus SoloStar 100 UNIT/ML Solostar Pen Generic drug: insulin glargine Inject 28 Units into the skin at bedtime.   lip balm ointment Apply topically as needed for lip care.   loratadine 10 MG tablet Commonly known as: CLARITIN Take 1 tablet (10 mg total) by mouth daily.   LORazepam 0.5 MG tablet Commonly known as: ATIVAN TAKE 1 TABLET BY MOUTH AT BEDTIME   melatonin 3 MG Tabs tablet Take 3 tablets (9 mg total) by mouth at bedtime.   methimazole 5 MG tablet Commonly known as: TAPAZOLE TAKE 1 TABLET BY MOUTH EVERY MORNING What changed: when to take this   metoCLOPramide 10 MG tablet Commonly known as: REGLAN Take 1 tablet (10 mg total) by mouth 3 (three) times daily before meals.   metoprolol  tartrate 100 MG tablet Commonly known as: LOPRESSOR TAKE 1 TABLET BY MOUTH TWICE DAILY (BREAKFAST, BEDTIME) What changed: See the new instructions.   nystatin powder Commonly known as: MYCOSTATIN/NYSTOP USE 1 APPLICATION TWICE DAILY What changed: See the new instructions.   ondansetron 4 MG tablet Commonly known as: ZOFRAN Take 1 tablet (4 mg total) by mouth every 8 (eight) hours as needed for nausea or vomiting. What changed:  when to take this reasons to take this   pantoprazole 40 MG tablet Commonly known as: PROTONIX TAKE 1 TABLET BY MOUTH EVERY MORNING What changed: when to take this   polyethylene glycol powder 17 GM/SCOOP powder Commonly known as: GLYCOLAX/MIRALAX Take 17 g by mouth 2 (two) times daily.   rosuvastatin 40 MG tablet Commonly known as: CRESTOR Take 1 tablet (40 mg total) by mouth daily.   senna 8.6 MG tablet Commonly known as: SENOKOT Take 1 tablet by mouth as needed for constipation.   tamsulosin 0.4 MG Caps capsule Commonly known as: FLOMAX TAKE 1 CAPSULE BY MOUTH AT BEDTIME What changed: when to take this   topiramate 25 MG tablet Commonly known as: TOPAMAX TAKE  1 TABLET(25 MG) BY MOUTH TWICE DAILY What changed: See the new instructions.        Follow-up Information     Ngetich, Dinah C, NP. Call today.   Specialty: Family Medicine Why: Please call for a posthospital follow-up appointment. Contact information: [place] [address] [phone]         Early Osmond, MD .   Specialty: Cardiology Contact information: [place] [address] [phone]                Discharge Exam: Danley Danker Weights   07/15/21 2300 07/18/21 0417  Weight: 92.5 kg 94.4 kg   General exam: Awake, laying in bed, in nad Respiratory system: Normal respiratory effort, no wheezing Cardiovascular system: regular rate, s1, s2 Gastrointestinal system: Soft, nondistended, positive BS Central nervous  system: CN2-12 grossly intact, strength intact Extremities: Perfused, no clubbing Skin: Normal skin turgor, no notable skin lesions seen Psychiatry: Mood normal // no visual hallucinations   Condition at discharge: fair  The results of significant diagnostics from this hospitalization (including imaging, microbiology, ancillary and laboratory) are listed below for reference.   Imaging Studies: CT Head Wo Contrast  Result Date: 07/10/2021 CLINICAL DATA:  Altered mental status. Mental status change of unknown cause. Headache and vomiting. Chronic left-sided weakness. EXAM: CT HEAD WITHOUT CONTRAST TECHNIQUE: Contiguous axial images were obtained from the base of the skull through the vertex without intravenous contrast. RADIATION DOSE REDUCTION: This exam was performed according to the departmental dose-optimization program which includes automated exposure control, adjustment of the mA and/or kV according to patient size and/or use of iterative reconstruction technique. COMPARISON:  Head CT and brain MRI 06/11/2021 FINDINGS: Brain: No acute intracranial hemorrhage. There is no evidence of acute ischemia. Remote lacunar infarcts within bilateral basal ganglia and remote left cerebellar infarct, unchanged. No hydrocephalus. Periventricular and deep white matter hypodensity typical of chronic small vessel ischemia, stable. Vascular: Atherosclerosis of skullbase vasculature without hyperdense vessel or abnormal calcification. Skull: No fracture or focal lesion. Sinuses/Orbits: Paranasal sinuses and mastoid air cells are clear. The visualized orbits are unremarkable. Other: None. IMPRESSION: 1. No acute intracranial abnormality. 2. Remote left cerebellar infarct, chronic basal gangliar lacunar infarcts and age advanced chronic small vessel ischemia. Electronically Signed   By: Keith Rake M.D.   On: 07/10/2021 16:18   CT ABDOMEN PELVIS WO CONTRAST  Result Date: 07/10/2021 CLINICAL DATA:  Nausea and  vomiting since Friday. EXAM: CT ABDOMEN AND PELVIS WITHOUT CONTRAST TECHNIQUE: Multidetector CT imaging of the abdomen and pelvis was performed following the standard protocol without IV contrast. RADIATION DOSE REDUCTION: This exam was performed according to the departmental dose-optimization program which includes automated exposure control, adjustment of the mA and/or kV according to patient size and/or use of iterative reconstruction technique. COMPARISON:  Jun 11, 2021 CT abdomen pelvis FINDINGS: Lower chest: Bibasilar atelectasis versus scarring. Prior median sternotomy and CABG. Hepatobiliary: Unremarkable noncontrast appearance of the hepatic parenchyma. Gallbladder is unremarkable. No biliary ductal dilation. Pancreas: Mild stranding along the pancreatic head may reflect pancreatitis recommend correlation with serum lipase. No pancreatic ductal dilation. Spleen: No splenomegaly. 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 Mild thickening of the left adrenal gland without discrete nodularity is similar prior. No hydronephrosis. No renal, ureteral or bladder calculi. Urinary bladder is unremarkable for degree of distension. Stomach/Bowel: No radiopaque enteric contrast material was administered. Small hiatal hernia otherwise the stomach is unremarkable for degree of distension. No pathologic  dilation of small or large bowel. Terminal ileum appears normal. The appendix is not confidently identified however there is no pericecal inflammation. Evidence of acute bowel inflammation. Vascular/Lymphatic: Extensive aortic and branch vessel atherosclerosis without abdominal aortic aneurysm. No pathologically enlarged abdominal or pelvic lymph nodes. Reproductive: Status post hysterectomy. No adnexal masses. Other: No significant abdominopelvic free fluid. No pneumoperitoneum. Similar size of the heterogeneous soft tissue nodularity in the anterior abdominal wall which measures 3.3 x 2.0 cm on  image 84/4 previously 3.4 x 2.2 cm, and appears to be at the level of patient's known C-section scar. Musculoskeletal: No acute osseous abnormality. IMPRESSION: 1. No acute abnormality in the abdomen or pelvis on this noncontrast CT, specifically no evidence of bowel obstruction or acute bowel inflammation. 2. No significant interval change 3.3 cm heterogeneous soft tissue density within the deep subcutaneous soft tissues of the lower anterior abdominal wall potentially related to prior surgery. 3. Prior right adrenalectomy. Electronically Signed   By: Dahlia Bailiff M.D.   On: 07/10/2021 14:35   [place] EKG SITE RITE  Result Date: 07/10/2021 If Site Rite image not attached, placement could not be confirmed due to current cardiac rhythm.  DG Abd Portable 1 View  Result Date: 07/10/2021 CLINICAL DATA:  Nausea and vomiting EXAM: PORTABLE ABDOMEN - 1 VIEW COMPARISON:  02/14/2021 FINDINGS: The bowel gas pattern is normal. No radio-opaque calculi or other significant radiographic abnormality are seen. Age advanced aortoiliac atherosclerotic calcification. IMPRESSION: 1. Nonobstructive bowel gas pattern. 2. Age advanced aortoiliac atherosclerotic calcification. Electronically Signed   By: Davina Poke D.O.   On: 07/10/2021 12:43   DG Chest Portable 1 View  Result Date: 07/10/2021 CLINICAL DATA:  Altered mental status, vomiting. EXAM: PORTABLE CHEST 1 VIEW COMPARISON:  Jun 11, 2021 FINDINGS: Postsurgical changes from CABG. Cardiomediastinal silhouette is normal. Mediastinal contours appear intact. Streaky perihilar and peribronchial opacities in bilateral lower lobes. Low lung volumes. Osseous structures are without acute abnormality. Soft tissues are grossly normal. IMPRESSION: Streaky perihilar and peribronchial opacities in bilateral lower lobes may represent atelectasis or peribronchial airspace consolidation. Electronically Signed   By: Fidela Salisbury M.D.   On: 07/10/2021 12:42    Microbiology: Results  for orders placed or performed during the hospital encounter of 07/10/21  MRSA Next Gen by PCR, Nasal     Status: None   Collection Time: 07/10/21  7:55 PM   Specimen: Nasal Mucosa; Nasal Swab  Result Value Ref Range Status   MRSA by PCR Next Gen NOT DETECTED NOT DETECTED Final    Comment: (NOTE) The GeneXpert MRSA Assay (FDA approved for NASAL specimens only), is one component of a comprehensive MRSA colonization surveillance program. It is not intended to diagnose MRSA infection nor to guide or monitor treatment for MRSA infections. Test performance is not FDA approved in patients less than 65 years old. Performed at [facility], [place] [address] [place], [place] [address]   Urine Culture     Status: None   Collection Time: 07/11/21  2:38 PM   Specimen: Urine, Clean Catch  Result Value Ref Range Status   Specimen Description   Final    URINE, CLEAN CATCH Performed at [facility], [place] [address], [place], [place] [address]    Special Requests   Final    NONE Performed at [facility], [place] [address] [place], [place] [address]    Culture   Final    NO GROWTH Performed at [facility],  [address], [place], [place] [address]    Report Status 07/12/2021 FINAL  Final    Labs: CBC: Recent Labs  Lab 07/13/21 0324  WBC 10.4  HGB 10.0*  HCT 30.0*  MCV 77.3*  PLT 009   Basic Metabolic Panel: Recent Labs  Lab 07/12/21 0332 07/13/21 0324 07/14/21 0355 07/15/21 0332 07/17/21 0341  NA 135 136 134* 134*  --   K 3.3* 3.2* 3.8 3.8  --   CL 100 103 105 106  --   CO2 _0 20*  --   GLUCOSE 212* 127* 123* 182*  --   BUN _1 --   CREATININE 0.49 0.55 0.56 0.63 0.58  CALCIUM 7.6* 8.0* 8.3* 8.2*  --   MG 1.7  --   --   --   --    Liver Function Tests: No results for input(s): "AST", "ALT", "ALKPHOS", "BILITOT", "PROT", "ALBUMIN" in the last 168 hours. CBG: Recent Labs  Lab 07/17/21 1220  07/17/21 1733 07/17/21 2140 07/18/21 0722 07/18/21 1200  GLUCAP 265* 245* 168* 138* 189*    Discharge time spent: less than 30 minutes.  Signed: Marylu Lund, MD [facility] 07/18/2021

## 2021-07-19 ENCOUNTER — Other Ambulatory Visit: Payer: Self-pay | Admitting: Family

## 2021-07-19 ENCOUNTER — Telehealth: Payer: Self-pay | Admitting: *Deleted

## 2021-07-19 NOTE — Telephone Encounter (Signed)
Lori [name] with [facility] requesting Verbal order for Skilled Nursing 1X9weeks.   Verbal order given.

## 2021-07-20 ENCOUNTER — Telehealth: Payer: Self-pay

## 2021-07-20 NOTE — Telephone Encounter (Signed)
Transition Care Management Follow-up Telephone Call Date of discharge and from where: 07/18/2021, [facility] How have you been since you were released from the hospital? better Any questions or concerns? No  Items Reviewed: Did the pt receive and understand the discharge instructions provided? Yes  Medications obtained and verified? Yes  Other? No  Any new allergies since your discharge? no Dietary orders reviewed? Yes Do you have support at home? Yes   Home Care and Equipment/Supplies: Were home health services ordered? not applicable If so, what is the name of the agency? no  Has the agency set up a time to come to the patient's home? not applicable Were any new equipment or medical supplies ordered?  No What is the name of the medical supply agency? N/a Were you able to get the supplies/equipment? not applicable Do you have any questions related to the use of the equipment or supplies? No  Functional Questionnaire: (I = Independent and D = Dependent) ADLs: D  Bathing/Dressing- D  Meal Prep- D  Eating- I  Maintaining continence- I  Transferring/Ambulation- D  Managing Meds- I  Follow up appointments reviewed:  PCP Hospital f/u appt confirmed? Yes  Scheduled to see Ngetich, Nelda Bucks, NP  on 07/25/2021 @ 11 AM. [facility] f/u appt confirmed? No  Scheduled to see N/A on N/A @ N/A. Are transportation arrangements needed? Yes  If their condition worsens, is the pt aware to call PCP or go to the Emergency Dept.? Yes Was the patient provided with contact information for the PCP's office or ED? Yes Was to pt encouraged to call back with questions or concerns? Yes

## 2021-07-23 ENCOUNTER — Telehealth: Payer: Self-pay | Admitting: *Deleted

## 2021-07-23 ENCOUNTER — Institutional Professional Consult (permissible substitution): Payer: Medicare Other | Admitting: Neurology

## 2021-07-23 ENCOUNTER — Telehealth: Payer: Self-pay

## 2021-07-23 NOTE — Telephone Encounter (Signed)
Sicila with [facility] called requesting verbal orders for PT 1X1week, 2X5weeks, 1X1week.   Verbal orders given.

## 2021-07-23 NOTE — Telephone Encounter (Signed)
Incoming call received from [place] with [facility] stating they are trying to get authorization to refill patients Ambien.  I advised Cary to have the patient call our office to schedule an appointment to further discuss,as we do not have Ambien listed on her active medication list. Lori [name] verbalized understanding and states, she will relay the message.

## 2021-07-25 ENCOUNTER — Encounter: Payer: Self-pay | Admitting: Family

## 2021-07-25 ENCOUNTER — Encounter: Payer: Medicare Other | Admitting: Family

## 2021-07-25 ENCOUNTER — Telehealth: Payer: Self-pay

## 2021-07-25 NOTE — Telephone Encounter (Signed)
Message left on clinical intake voicemail:   Speech therapist with [facility] called requesting verbal orders for Speech Therapy 1 x weekly for 6 weeks   I returned call and authorized verbal orders as we have a standing protocol here at [facility] to give verbal ok for established patient as long as they have been seen within the last 6-12 months.   Message will be sent to Ngetich, Nelda Bucks, NP as a FYI, however no additional action is required.

## 2021-07-25 NOTE — Telephone Encounter (Signed)
Noted  

## 2021-07-26 ENCOUNTER — Telehealth: Payer: Self-pay | Admitting: *Deleted

## 2021-07-26 NOTE — Telephone Encounter (Signed)
Received fax from [address] Lori [name] [phone] Requesting [facility] Form to be filled out. Stated that they are working on Sports administrator for [employer].   Form filled out and signed and faxed back to Santiago Glad at Fax: [phone]  Account #: [id number]  Copy sent to scanning.

## 2021-07-31 ENCOUNTER — Encounter: Payer: Self-pay | Admitting: Family

## 2021-07-31 ENCOUNTER — Ambulatory Visit (INDEPENDENT_AMBULATORY_CARE_PROVIDER_SITE_OTHER): Payer: Medicaid Other | Admitting: Family

## 2021-07-31 VITALS — BP 150/100 | HR 98 | Temp 97.3°F | Resp 18 | Ht 62.0 in | Wt 196.0 lb

## 2021-07-31 DIAGNOSIS — E1169 Type 2 diabetes mellitus with other specified complication: Secondary | ICD-10-CM | POA: Diagnosis not present

## 2021-07-31 DIAGNOSIS — I10 Essential (primary) hypertension: Secondary | ICD-10-CM

## 2021-07-31 DIAGNOSIS — B3731 Acute candidiasis of vulva and vagina: Secondary | ICD-10-CM

## 2021-07-31 DIAGNOSIS — E785 Hyperlipidemia, unspecified: Secondary | ICD-10-CM

## 2021-07-31 MED ORDER — ZINC OXIDE 11.3 % EX CREA
1.0000 | TOPICAL_CREAM | Freq: Two times a day (BID) | CUTANEOUS | 5 refills | Status: DC
Start: 2021-07-31 — End: 2022-06-26

## 2021-07-31 MED ORDER — NYSTATIN 100000 UNIT/GM EX CREA: 1.0000 | TOPICAL_CREAM | Freq: Two times a day (BID) | CUTANEOUS | 0 refills | Status: DC

## 2021-07-31 NOTE — Progress Notes (Signed)
Provider: Marlowe Sax FNP-C  Simmone Cape, Nelda Bucks, NP  Patient Care Team: Ryaan Vanwagoner, Nelda Bucks, NP as PCP - General (Family Medicine) Early Osmond, MD as PCP - Cardiology (Cardiology)  Extended Emergency Contact Information Primary Emergency Contact: [place] Phone: [phone] Mobile Phone: [phone] Relation: Daughter Secondary Emergency Contact: [name],[name] Mobile Phone: [phone] Relation: Spouse Preferred language: English Interpreter needed? No  Code Status:  Full Code  Goals of care: Advanced Directive information    08/03/2021   10:47 AM  Advanced Directives  Does Patient Have a Medical Advance Directive? No  Would patient like information on creating a medical advance directive? No - Patient declined     Chief Complaint  Patient presents with   Hospitalization Follow-up    Transition of care after hospital stay    HPI:  Pt is a 51 y.o. female seen today for an acute visit for transition of care post hospital admission from 07/10/2021 -07/18/2021 for hyperosmolar hyperglycemia.  She was here with severe nausea vomiting and dizziness and was send ED evaluation she was transported via EMS. CBG in the 600's.She had not been taking her Lantus at home.also had slurred speech.  Had work-up in the ED CT scan of the abdomen and pelvis was unremarkable.  Also had CT scanning of the head which showed no acute intracranial abnormality.  Had remote left cerebellar infarct, chronic basal ganglia lacunar infarct and advanced chronic small vessel ischemia.  Patient was admitted for HHS/gastroparesis/dehydration/tachycardia/aspiration pneumonia/mild pancreatitis which was managed with IV fluids insulin and Reglan.  Tachycardia resolved was continued on home Lopressor also had leukocytosis blood cultures were negative.  She was noted to have aspiration pneumonia was treated with a 5 days course of Rocephin.  She worked with Astronomer and monitored for for  aspiration precaution.  Speech therapist recommended dysphagia 3 diet mechanical soft diet with thin liquids liquids were provided via cup and straw and medication was administered whole with pure. She was also seen by Physical Therapy who recommended SNF.Lab work indicated hypomagnesium and hypokalemia and electrolytes were repleted She is here with her husband.States feeling much better since she was discharged home.    Has been using her insulin but did not bring home CBG log to visit today. She was treated previously for severe candidiasis on breast fold abdominal fold and gluteal area.  States redness has improved but has pain between her gluteal area.  Husband states reports still has redness on these areas.  She denies any fever or chills or cough. Her blood elevated during visit but has not taken her medication today husband states will administer med after visit. Medication reconciled  Past Medical History:  Diagnosis Date   CAD (coronary artery disease)    Depression    Diabetes mellitus without complication ([place])    History of CT scan    History of mammogram    History of MRI    Hypertension    Pheochromocytoma    Pheochromocytoma    Stroke [facility])    Thyroid disease    Past Surgical History:  Procedure Laterality Date   ABDOMINAL HYSTERECTOMY     CESAREAN SECTION     3   CORONARY ARTERY BYPASS GRAFT     Right adrenal gland removal for pheochromocytoma Right     Allergies  Allergen Reactions   Sumatriptan Anaphylaxis   Contrast Media [Iodinated Contrast Media] Swelling   Penicillins Swelling    Mouth swells up and eyes swollen shut    Outpatient  Encounter Medications as of 07/31/2021  Medication Sig   nystatin cream (MYCOSTATIN) Apply 1 Application topically 2 (two) times daily. Affected areas on breast fold and groin and sacral areas   zinc oxide (BALMEX) 11.3 % CREA cream Apply 1 Application topically 2 (two) times daily.   acetaminophen (TYLENOL) 325 MG tablet  Take 2 tablets (650 mg total) by mouth every 6 (six) hours as needed for mild pain or headache.   atorvastatin (LIPITOR) 80 MG tablet Take 80 mg by mouth daily.   blood glucose meter kit and supplies KIT Dispense based on patient and insurance preference. Use up to four times daily as directed.   butalbital-acetaminophen-caffeine (FIORICET) 50-325-40 MG tablet TAKE 1 TABLET BY MOUTH EVERY 6 HOURS AS NEEDED FOR MIGRANE   clopidogrel (PLAVIX) 75 MG tablet TAKE 1 TABLET BY MOUTH ONCE DAILY   Continuous Blood Gluc Receiver (FREESTYLE LIBRE 2 READER) DEVI Use to check blood sugar 3 times daily.   Continuous Blood Gluc Sensor (FREESTYLE LIBRE 2 SENSOR) MISC Use to check blood sugar 3 times daily.   diclofenac Sodium (VOLTAREN) 1 % GEL Apply 4 g topically 4 (four) times daily.   empagliflozin (JARDIANCE) 10 MG TABS tablet Take 1 tablet (10 mg total) by mouth daily before breakfast.   escitalopram (LEXAPRO) 20 MG tablet TAKE 1 TABLET BY MOUTH EVERY MORNING   ezetimibe (ZETIA) 10 MG tablet Take 1 tablet (10 mg total) by mouth daily.   gabapentin (NEURONTIN) 100 MG capsule TAKE 1 CAPSULE BY MOUTH THREE TIMES DAILY (BREAKFAST, LUNCH, BEDTIME)   hydrOXYzine (ATARAX) 25 MG tablet Take 1 tablet (25 mg total) by mouth at bedtime.   Insulin Aspart FlexPen (NOVOLOG) 100 UNIT/ML ADMINISTER 4 UNITS UNDER THE SKIN THREE TIMES DAILY WITH MEALS   Insulin Pen Needle 32G X 4 MM MISC Use to inject insulin 4 times daily as directed.   lip balm (CARMEX) ointment Apply topically as needed for lip care.   loratadine (CLARITIN) 10 MG tablet Take 1 tablet (10 mg total) by mouth daily.   LORazepam (ATIVAN) 0.5 MG tablet TAKE 1 TABLET BY MOUTH AT BEDTIME   melatonin 3 MG TABS tablet Take 3 tablets (9 mg total) by mouth at bedtime.   methimazole (TAPAZOLE) 5 MG tablet TAKE 1 TABLET BY MOUTH EVERY MORNING   metoCLOPramide (REGLAN) 10 MG tablet Take 1 tablet (10 mg total) by mouth 3 (three) times daily before meals.   metoprolol  tartrate (LOPRESSOR) 100 MG tablet TAKE 1 TABLET BY MOUTH TWICE DAILY (BREAKFAST, BEDTIME)   ondansetron (ZOFRAN) 4 MG tablet Take 1 tablet (4 mg total) by mouth every 8 (eight) hours as needed for nausea or vomiting.   pantoprazole (PROTONIX) 40 MG tablet TAKE 1 TABLET BY MOUTH EVERY MORNING   polyethylene glycol powder (GLYCOLAX/MIRALAX) 17 GM/SCOOP powder Take 17 g by mouth 2 (two) times daily.   rosuvastatin (CRESTOR) 40 MG tablet Take 1 tablet (40 mg total) by mouth daily.   senna (SENOKOT) 8.6 MG tablet Take 1 tablet by mouth as needed for constipation.   tamsulosin (FLOMAX) 0.4 MG CAPS capsule TAKE 1 CAPSULE BY MOUTH AT BEDTIME   topiramate (TOPAMAX) 25 MG tablet TAKE 1 TABLET(25 MG) BY MOUTH TWICE DAILY   [DISCONTINUED] insulin glargine (LANTUS SOLOSTAR) 100 UNIT/ML Solostar Pen Inject 28 Units into the skin at bedtime.   [DISCONTINUED] nystatin (MYCOSTATIN/NYSTOP) powder USE 1 APPLICATION TWICE DAILY (Patient taking differently: Apply 1 application. topically 2 (two) times daily.)   No facility-administered encounter medications  on file as of 07/31/2021.    Review of Systems  Constitutional:  Negative for appetite change, chills, fatigue, fever and unexpected weight change.  HENT:  Negative for congestion, dental problem, ear discharge, ear pain, facial swelling, hearing loss, nosebleeds, postnasal drip, rhinorrhea, sinus pressure, sinus pain, sneezing, sore throat, tinnitus and trouble swallowing.   Eyes:  Negative for pain, discharge, redness, itching and visual disturbance.  Respiratory:  Negative for cough, chest tightness, shortness of breath and wheezing.   Cardiovascular:  Negative for chest pain, palpitations and leg swelling.  Gastrointestinal:  Negative for abdominal distention, abdominal pain, blood in stool, constipation, diarrhea, nausea and vomiting.  Endocrine: Negative for cold intolerance, heat intolerance, polydipsia, polyphagia and polyuria.  Genitourinary:  Negative  for difficulty urinating, dysuria, flank pain and urgency.       Incontinent for blood  Musculoskeletal:  Positive for arthralgias and gait problem. Negative for back pain, joint swelling, myalgias, neck pain and neck stiffness.  Skin:  Negative for color change, pallor and rash.       Skin redness on folds  Neurological:  Positive for weakness. Negative for dizziness, syncope, light-headedness, numbness and headaches.       Left hand contraction  Hematological:  Does not bruise/bleed easily.  Psychiatric/Behavioral:  Negative for agitation, behavioral problems, confusion, hallucinations, self-injury, sleep disturbance and suicidal ideas. The patient is not nervous/anxious.      There is no immunization history on file for this patient. Pertinent  Health Maintenance Due  Topic Date Due   FOOT EXAM  Never done   OPHTHALMOLOGY EXAM  Never done   URINE MICROALBUMIN  Never done   COLONOSCOPY (Pts 45-27yr Insurance coverage will need to be confirmed)  Never done   INFLUENZA VACCINE  09/04/2021   HEMOGLOBIN A1C  10/25/2021   PAP SMEAR-Modifier  Discontinued      07/17/2021    7:00 PM 07/17/2021    7:33 PM 07/18/2021   11:00 AM 07/31/2021    1:06 PM 08/03/2021   10:47 AM  Fall Risk  Falls in the past year?    1 0  Was there an injury with Fall?    1 0  Fall Risk Category Calculator    3 0  Fall Risk Category    High Low  Patient Fall Risk Level High fall risk High fall risk High fall risk High fall risk Low fall risk  Patient at Risk for Falls Due to    History of fall(s);Impaired balance/gait;Impaired mobility;Impaired vision No Fall Risks  Fall risk Follow up    Falls evaluation completed Falls evaluation completed   Functional Status Survey:    Vitals:   07/31/21 1303  BP: (!) 150/100  Pulse: 98  Resp: 18  Temp: (!) 97.3 F (36.3 C)  SpO2: 97%  Weight: 196 lb (88.9 kg)  Height: 5' 2"  (1.575 m)   Body mass index is 35.85 kg/m.  Physical Exam Vitals reviewed.   Constitutional:      General: She is not in acute distress.    Appearance: Normal appearance. She is obese. She is not ill-appearing or diaphoretic.  HENT:     Head: Normocephalic.     Right Ear: Tympanic membrane, ear canal and external ear normal. There is no impacted cerumen.     Left Ear: Tympanic membrane, ear canal and external ear normal. There is no impacted cerumen.     Nose: Nose normal. No congestion or rhinorrhea.     Mouth/Throat:  Mouth: Mucous membranes are moist.     Pharynx: Oropharynx is clear. No oropharyngeal exudate or posterior oropharyngeal erythema.  Eyes:     General: No scleral icterus.       Right eye: No discharge.        Left eye: No discharge.     Extraocular Movements: Extraocular movements intact.     Conjunctiva/sclera: Conjunctivae normal.     Pupils: Pupils are equal, round, and reactive to light.  Neck:     Vascular: No carotid bruit.  Cardiovascular:     Rate and Rhythm: Normal rate and regular rhythm.     Pulses: Normal pulses.     Heart sounds: Normal heart sounds. No murmur heard.    No friction rub. No gallop.  Pulmonary:     Effort: Pulmonary effort is normal. No respiratory distress.     Breath sounds: Normal breath sounds. No wheezing, rhonchi or rales.  Chest:     Chest wall: No tenderness.  Abdominal:     General: Bowel sounds are normal. There is no distension.     Palpations: Abdomen is soft. There is no mass.     Tenderness: There is no abdominal tenderness. There is no right CVA tenderness, left CVA tenderness, guarding or rebound.  Musculoskeletal:        General: No swelling or tenderness.     Cervical back: Normal range of motion. No rigidity or tenderness.     Right lower leg: No edema.     Left lower leg: No edema.     Comments: Wheelchair bound   Lymphadenopathy:     Cervical: No cervical adenopathy.  Skin:    General: Skin is warm and dry.     Coloration: Skin is not pale.     Findings: No bruising, erythema,  lesion or rash.  Neurological:     Mental Status: She is alert. Mental status is at baseline.     Cranial Nerves: No cranial nerve deficit.     Motor: Weakness present.     Coordination: Coordination normal.     Gait: Gait abnormal.     Comments: Left hemiparesis   Psychiatric:        Mood and Affect: Mood normal.        Speech: Speech normal.        Behavior: Behavior normal.        Thought Content: Thought content normal.        Judgment: Judgment normal.    Labs reviewed: Recent Labs    02/21/21 0407 02/22/21 0306 06/21/21 1522 07/10/21 1222 07/11/21 0300 07/11/21 1230 07/12/21 0332 07/13/21 0324 07/14/21 0355 07/15/21 0332 07/17/21 0341 07/31/21 1343  NA 134*   < >  --    < > 135   < > 135   < > 134* 134*  --  124*  K 3.5   < >  --    < > 3.1*   < > 3.3*   < > 3.8 3.8  --  5.3  CL 99   < >  --    < > 99   < > 100   < > 105 106  --  88*  CO2 27   < >  --    < > 26   < > 26   < > 24 20*  --  22  GLUCOSE 215*   < >  --    < > 155*   < > 212*   < >  123* 182*  --  677*  BUN 7   < >  --    < > 12   < > 7   < > 7 7  --  15  CREATININE 0.59   < >  --    < > 0.54   < > 0.49   < > 0.56 0.63 0.58 0.83  CALCIUM 8.6*   < >  --    < > 8.0*   < > 7.6*   < > 8.3* 8.2*  --  9.5  MG 1.7   < > 1.6  --  1.4*  --  1.7  --   --   --   --   --   PHOS 4.3  --   --   --   --   --   --   --   --   --   --   --    < > = values in this interval not displayed.   Recent Labs    04/30/21 1808 06/11/21 1500 07/10/21 1233  AST 17 17 13*  ALT 16 19 14   ALKPHOS 92 124 116  BILITOT 0.5 0.8 1.2  PROT 6.8 8.5* 8.0  ALBUMIN 3.2* 4.1 3.5   Recent Labs    06/21/21 1522 07/10/21 1222 07/10/21 1233 07/11/21 1230 07/13/21 0324 07/31/21 1343  WBC 7.2  --  22.8* 14.9* 10.4 8.8  NEUTROABS 4,140  --  19.9*  --   --  6,072  HGB 14.4   < > 14.5 11.0* 10.0* 13.4  HCT 44.8   < > 43.0 32.7* 30.0* 44.2  MCV 80.6  --  77.3* 77.7* 77.3* 85.2  PLT 394  --  364 280 279 503*   < > = values in this  interval not displayed.   Lab Results  Component Value Date   TSH 1.072 06/11/2021   Lab Results  Component Value Date   HGBA1C 8.4 (H) 04/24/2021   Lab Results  Component Value Date   CHOL 183 04/24/2021   HDL 49 (L) 04/24/2021   LDLCALC 108 (H) 04/24/2021   TRIG 143 04/24/2021   CHOLHDL 3.7 04/24/2021    Significant Diagnostic Results in last 30 days:  CT Head Wo Contrast  Result Date: 07/10/2021 CLINICAL DATA:  Altered mental status. Mental status change of unknown cause. Headache and vomiting. Chronic left-sided weakness. EXAM: CT HEAD WITHOUT CONTRAST TECHNIQUE: Contiguous axial images were obtained from the base of the skull through the vertex without intravenous contrast. RADIATION DOSE REDUCTION: This exam was performed according to the departmental dose-optimization program which includes automated exposure control, adjustment of the mA and/or kV according to patient size and/or use of iterative reconstruction technique. COMPARISON:  Head CT and brain MRI 06/11/2021 FINDINGS: Brain: No acute intracranial hemorrhage. There is no evidence of acute ischemia. Remote lacunar infarcts within bilateral basal ganglia and remote left cerebellar infarct, unchanged. No hydrocephalus. Periventricular and deep white matter hypodensity typical of chronic small vessel ischemia, stable. Vascular: Atherosclerosis of skullbase vasculature without hyperdense vessel or abnormal calcification. Skull: No fracture or focal lesion. Sinuses/Orbits: Paranasal sinuses and mastoid air cells are clear. The visualized orbits are unremarkable. Other: None. IMPRESSION: 1. No acute intracranial abnormality. 2. Remote left cerebellar infarct, chronic basal gangliar lacunar infarcts and age advanced chronic small vessel ischemia. Electronically Signed   By: Keith Rake M.D.   On: 07/10/2021 16:18   CT ABDOMEN PELVIS WO CONTRAST  Result Date: 07/10/2021 CLINICAL DATA:  Nausea and vomiting since Friday. EXAM: CT  ABDOMEN AND PELVIS WITHOUT CONTRAST TECHNIQUE: Multidetector CT imaging of the abdomen and pelvis was performed following the standard protocol without IV contrast. RADIATION DOSE REDUCTION: This exam was performed according to the departmental dose-optimization program which includes automated exposure control, adjustment of the mA and/or kV according to patient size and/or use of iterative reconstruction technique. COMPARISON:  Jun 11, 2021 CT abdomen pelvis FINDINGS: Lower chest: Bibasilar atelectasis versus scarring. Prior median sternotomy and CABG. Hepatobiliary: Unremarkable noncontrast appearance of the hepatic parenchyma. Gallbladder is unremarkable. No biliary ductal dilation. Pancreas: Mild stranding along the pancreatic head may reflect pancreatitis recommend correlation with serum lipase. No pancreatic ductal dilation. Spleen: No splenomegaly. 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 Mild thickening of the left adrenal gland without discrete nodularity is similar prior. No hydronephrosis. No renal, ureteral or bladder calculi. Urinary bladder is unremarkable for degree of distension. Stomach/Bowel: No radiopaque enteric contrast material was administered. Small hiatal hernia otherwise the stomach is unremarkable for degree of distension. No pathologic dilation of small or large bowel. Terminal ileum appears normal. The appendix is not confidently identified however there is no pericecal inflammation. Evidence of acute bowel inflammation. Vascular/Lymphatic: Extensive aortic and branch vessel atherosclerosis without abdominal aortic aneurysm. No pathologically enlarged abdominal or pelvic lymph nodes. Reproductive: Status post hysterectomy. No adnexal masses. Other: No significant abdominopelvic free fluid. No pneumoperitoneum. Similar size of the heterogeneous soft tissue nodularity in the anterior abdominal wall which measures 3.3 x 2.0 cm on image 84/4 previously 3.4 x 2.2 cm,  and appears to be at the level of patient's known C-section scar. Musculoskeletal: No acute osseous abnormality. IMPRESSION: 1. No acute abnormality in the abdomen or pelvis on this noncontrast CT, specifically no evidence of bowel obstruction or acute bowel inflammation. 2. No significant interval change 3.3 cm heterogeneous soft tissue density within the deep subcutaneous soft tissues of the lower anterior abdominal wall potentially related to prior surgery. 3. Prior right adrenalectomy. Electronically Signed   By: Dahlia Bailiff M.D.   On: 07/10/2021 14:35   [place] EKG SITE RITE  Result Date: 07/10/2021 If Site Rite image not attached, placement could not be confirmed due to current cardiac rhythm.  DG Abd Portable 1 View  Result Date: 07/10/2021 CLINICAL DATA:  Nausea and vomiting EXAM: PORTABLE ABDOMEN - 1 VIEW COMPARISON:  02/14/2021 FINDINGS: The bowel gas pattern is normal. No radio-opaque calculi or other significant radiographic abnormality are seen. Age advanced aortoiliac atherosclerotic calcification. IMPRESSION: 1. Nonobstructive bowel gas pattern. 2. Age advanced aortoiliac atherosclerotic calcification. Electronically Signed   By: Davina Poke D.O.   On: 07/10/2021 12:43   DG Chest Portable 1 View  Result Date: 07/10/2021 CLINICAL DATA:  Altered mental status, vomiting. EXAM: PORTABLE CHEST 1 VIEW COMPARISON:  Jun 11, 2021 FINDINGS: Postsurgical changes from CABG. Cardiomediastinal silhouette is normal. Mediastinal contours appear intact. Streaky perihilar and peribronchial opacities in bilateral lower lobes. Low lung volumes. Osseous structures are without acute abnormality. Soft tissues are grossly normal. IMPRESSION: Streaky perihilar and peribronchial opacities in bilateral lower lobes may represent atelectasis or peribronchial airspace consolidation. Electronically Signed   By: Fidela Salisbury M.D.   On: 07/10/2021 12:42    Assessment/Plan  1. Type 2 diabetes mellitus with  hyperlipidemia (HCC) Lab Results  Component Value Date   HGBA1C 8.4 (H) 04/24/2021  S/p hospital transition as above with CBG greater than 600 she was not taking her insulin has advised.  Discussed at length  advised on to take her medication as directed. -Continue on Lantus, NovoLog and Jardiance -Advised to notify provider if blood sugars are consistently above 200  2. Essential hypertension Blood pressure elevated this visit but has not taken her blood pressure medication She will take blood pressure medication after visit - CBC with Differential/Platelet - BMP with eGFR([facility])  3. Vulvovaginal candidiasis Has improved but not resolved difficult to manage due to urine incontinence seems to sit on wet diaper for prolonged period of  time. Refill nystatin cream.  Advised also to get zinc oxide and apply to perianal area including between the gluteal area. - nystatin cream (MYCOSTATIN); Apply 1 Application topically 2 (two) times daily. Affected areas on breast fold and groin and sacral areas  Dispense: 30 g; Refill: 0  Family/ staff Communication: Reviewed plan of care with patient and husband verbalized understanding  Labs/tests ordered:  - CBC with Differential/Platelet - BMP with eGFR([facility])  Next Appointment: Return if symptoms worsen or fail to improve.   [name], NP

## 2021-08-01 ENCOUNTER — Other Ambulatory Visit: Payer: Self-pay

## 2021-08-01 DIAGNOSIS — E1169 Type 2 diabetes mellitus with other specified complication: Secondary | ICD-10-CM

## 2021-08-01 LAB — CBC WITH DIFFERENTIAL/PLATELET
Absolute Monocytes: 519 cells/uL (ref 200–950)
Basophils Absolute: 53 cells/uL (ref 0–200)
Basophils Relative: 0.6 %
Eosinophils Absolute: 167 cells/uL (ref 15–500)
Eosinophils Relative: 1.9 %
HCT: 44.2 % (ref 35.0–45.0)
Hemoglobin: 13.4 g/dL (ref 11.7–15.5)
Lymphs Abs: 1989 cells/uL (ref 850–3900)
MCH: 25.8 pg — ABNORMAL LOW (ref 27.0–33.0)
MCHC: 30.3 g/dL — ABNORMAL LOW (ref 32.0–36.0)
MCV: 85.2 fL (ref 80.0–100.0)
MPV: 9.3 fL (ref 7.5–12.5)
Monocytes Relative: 5.9 %
Neutro Abs: 6072 cells/uL (ref 1500–7800)
Neutrophils Relative %: 69 %
Platelets: 503 10*3/uL — ABNORMAL HIGH (ref 140–400)
RBC: 5.19 10*6/uL — ABNORMAL HIGH (ref 3.80–5.10)
RDW: 15.4 % — ABNORMAL HIGH (ref 11.0–15.0)
Total Lymphocyte: 22.6 %
WBC: 8.8 10*3/uL (ref 3.8–10.8)

## 2021-08-01 LAB — BASIC METABOLIC PANEL WITH GFR
BUN: 15 mg/dL (ref 7–25)
CO2: 22 mmol/L (ref 20–32)
Calcium: 9.5 mg/dL (ref 8.6–10.2)
Chloride: 88 mmol/L — ABNORMAL LOW (ref 98–110)
Creat: 0.83 mg/dL (ref 0.50–0.99)
Glucose, Bld: 677 mg/dL (ref 65–99)
Potassium: 5.3 mmol/L (ref 3.5–5.3)
Sodium: 124 mmol/L — ABNORMAL LOW (ref 135–146)
eGFR: 87 mL/min/{1.73_m2} (ref >=60)

## 2021-08-01 MED ORDER — LANTUS SOLOSTAR 100 UNIT/ML ~~LOC~~ SOPN: 30.0000 [IU] | PEN_INJECTOR | Freq: Every day | SUBCUTANEOUS | 4 refills | Status: DC

## 2021-08-01 NOTE — Progress Notes (Signed)
Urgent Referral order for Nutrition and diabetic Education.

## 2021-08-01 NOTE — Addendum Note (Signed)
Addended byMarlowe Sax C on: 08/01/2021 12:56 PM   Modules accepted: Orders

## 2021-08-01 NOTE — Addendum Note (Signed)
Addended by: Debe Coder on: 08/01/2021 10:53 AM   Modules accepted: Orders

## 2021-08-03 ENCOUNTER — Encounter: Payer: Self-pay | Admitting: Family

## 2021-08-03 ENCOUNTER — Telehealth (INDEPENDENT_AMBULATORY_CARE_PROVIDER_SITE_OTHER): Payer: Medicaid Other | Admitting: Family

## 2021-08-03 ENCOUNTER — Telehealth: Payer: Self-pay | Admitting: *Deleted

## 2021-08-03 DIAGNOSIS — R339 Retention of urine, unspecified: Secondary | ICD-10-CM | POA: Diagnosis not present

## 2021-08-03 NOTE — Progress Notes (Signed)
This service is provided via telemedicine  No vital signs collected/recorded due to the encounter was a telemedicine visit.   Location of patient (ex: home, work):  Home  Patient consents to a telephone visit:  Yes  Location of the provider (ex: office, home):  [employer].  Name of any referring provider:  Mancil Pfenning, Nelda Bucks, NP   Names of all persons participating in the telemedicine service and their role in the encounter:  Patient, Heriberto Antig[place], [place], [place], Webb Silversmith, NP.    Time spent on call: 8 minutes spent on the phone with Medical Assistant.       Provider: Marlowe Sax FNP-C  Rickie Gutierres, Nelda Bucks, NP  Patient Care Team: Missy Baksh, Nelda Bucks, NP as PCP - General (Family Medicine) Early Osmond, MD as PCP - Cardiology (Cardiology)  Extended Emergency Contact Information Primary Emergency Contact: [place] Phone: [phone] Mobile Phone: [phone] Relation: Daughter Secondary Emergency Contact: [name],[name] Mobile Phone: [phone] Relation: Spouse Preferred language: English Interpreter needed? No  Code Status: Full Code  Goals of care: Advanced Directive information    08/03/2021   10:47 AM  Advanced Directives  Does Patient Have a Medical Advance Directive? No  Would patient like information on creating a medical advance directive? No - Patient declined     Chief Complaint  Patient presents with   Acute Visit    Patient complains of only being able to urinate 3 times in the past 24hours.    HPI:  Pt is a 51 y.o. female seen today for an acute visit for evaluation of inability to urinate.States was able to urinate 3 times yesterday but none during the night and today. Has been drinking plenty of water.  Does have tenderness on lower abdomen.She denies any fever,chills,nausea,vomiting,flank pain,urgency,,dysuria or hematuria. She is not on any diuretic.   Past Medical History:  Diagnosis Date   CAD (coronary  artery disease)    Depression    Diabetes mellitus without complication ([place])    History of CT scan    History of mammogram    History of MRI    Hypertension    Pheochromocytoma    Pheochromocytoma    Stroke ([place])    Thyroid disease    Past Surgical History:  Procedure Laterality Date   ABDOMINAL HYSTERECTOMY     CESAREAN SECTION     3   CORONARY ARTERY BYPASS GRAFT     Right adrenal gland removal for pheochromocytoma Right     Allergies  Allergen Reactions   Sumatriptan Anaphylaxis   Contrast Media [Iodinated Contrast Media] Swelling   Penicillins Swelling    Mouth swells up and eyes swollen shut    Outpatient Encounter Medications as of 08/03/2021  Medication Sig   acetaminophen (TYLENOL) 325 MG tablet Take 2 tablets (650 mg total) by mouth every 6 (six) hours as needed for mild pain or headache.   atorvastatin (LIPITOR) 80 MG tablet Take 80 mg by mouth daily.   blood glucose meter kit and supplies KIT Dispense based on patient and insurance preference. Use up to four times daily as directed.   butalbital-acetaminophen-caffeine (FIORICET) 50-325-40 MG tablet TAKE 1 TABLET BY MOUTH EVERY 6 HOURS AS NEEDED FOR MIGRANE   clopidogrel (PLAVIX) 75 MG tablet TAKE 1 TABLET BY MOUTH ONCE DAILY   Continuous Blood Gluc Receiver (FREESTYLE LIBRE 2 READER) DEVI Use to check blood sugar 3 times daily.   Continuous Blood Gluc Sensor (FREESTYLE LIBRE 2 SENSOR) MISC Use to check blood sugar  3 times daily.   diclofenac Sodium (VOLTAREN) 1 % GEL Apply 4 g topically 4 (four) times daily.   empagliflozin (JARDIANCE) 10 MG TABS tablet Take 1 tablet (10 mg total) by mouth daily before breakfast.   escitalopram (LEXAPRO) 20 MG tablet TAKE 1 TABLET BY MOUTH EVERY MORNING   ezetimibe (ZETIA) 10 MG tablet Take 1 tablet (10 mg total) by mouth daily.   gabapentin (NEURONTIN) 100 MG capsule TAKE 1 CAPSULE BY MOUTH THREE TIMES DAILY (BREAKFAST, LUNCH, BEDTIME)   hydrOXYzine (ATARAX) 25 MG tablet Take 1  tablet (25 mg total) by mouth at bedtime.   Insulin Aspart FlexPen (NOVOLOG) 100 UNIT/ML ADMINISTER 4 UNITS UNDER THE SKIN THREE TIMES DAILY WITH MEALS   insulin glargine (LANTUS SOLOSTAR) 100 UNIT/ML Solostar Pen Inject 30 Units into the skin at bedtime.   Insulin Pen Needle 32G X 4 MM MISC Use to inject insulin 4 times daily as directed.   lip balm (CARMEX) ointment Apply topically as needed for lip care.   loratadine (CLARITIN) 10 MG tablet Take 1 tablet (10 mg total) by mouth daily.   LORazepam (ATIVAN) 0.5 MG tablet TAKE 1 TABLET BY MOUTH AT BEDTIME   melatonin 3 MG TABS tablet Take 3 tablets (9 mg total) by mouth at bedtime.   methimazole (TAPAZOLE) 5 MG tablet TAKE 1 TABLET BY MOUTH EVERY MORNING   metoCLOPramide (REGLAN) 10 MG tablet Take 1 tablet (10 mg total) by mouth 3 (three) times daily before meals.   metoprolol tartrate (LOPRESSOR) 100 MG tablet TAKE 1 TABLET BY MOUTH TWICE DAILY (BREAKFAST, BEDTIME)   nystatin cream (MYCOSTATIN) Apply 1 Application topically 2 (two) times daily. Affected areas on breast fold and groin and sacral areas   ondansetron (ZOFRAN) 4 MG tablet Take 1 tablet (4 mg total) by mouth every 8 (eight) hours as needed for nausea or vomiting.   pantoprazole (PROTONIX) 40 MG tablet TAKE 1 TABLET BY MOUTH EVERY MORNING   polyethylene glycol powder (GLYCOLAX/MIRALAX) 17 GM/SCOOP powder Take 17 g by mouth 2 (two) times daily.   rosuvastatin (CRESTOR) 40 MG tablet Take 1 tablet (40 mg total) by mouth daily.   senna (SENOKOT) 8.6 MG tablet Take 1 tablet by mouth as needed for constipation.   tamsulosin (FLOMAX) 0.4 MG CAPS capsule TAKE 1 CAPSULE BY MOUTH AT BEDTIME   topiramate (TOPAMAX) 25 MG tablet TAKE 1 TABLET(25 MG) BY MOUTH TWICE DAILY   zinc oxide (BALMEX) 11.3 % CREA cream Apply 1 Application topically 2 (two) times daily.   No facility-administered encounter medications on file as of 08/03/2021.    Review of Systems  Constitutional:  Negative for appetite  change, chills, fatigue, fever and unexpected weight change.  Respiratory:  Negative for cough, chest tightness, shortness of breath and wheezing.   Cardiovascular:  Negative for chest pain, palpitations and leg swelling.  Gastrointestinal:  Positive for abdominal pain. Negative for abdominal distention, constipation, diarrhea, nausea and vomiting.       Lower abdominal pain   Genitourinary:  Positive for difficulty urinating. Negative for dysuria, flank pain and urgency.       Initially incontinent and wears adult size diapers. Reports diaper has been dry throughout the night and this morning despite drinking fluids  Musculoskeletal:  Positive for gait problem. Negative for arthralgias, back pain, joint swelling and myalgias.  Psychiatric/Behavioral:  Negative for agitation, behavioral problems, confusion, hallucinations and sleep disturbance. The patient is not nervous/anxious.      There is no immunization history on  file for this patient. Pertinent  Health Maintenance Due  Topic Date Due   FOOT EXAM  Never done   OPHTHALMOLOGY EXAM  Never done   URINE MICROALBUMIN  Never done   COLONOSCOPY (Pts 45-17yr Insurance coverage will need to be confirmed)  Never done   INFLUENZA VACCINE  09/04/2021   HEMOGLOBIN A1C  10/25/2021   PAP SMEAR-Modifier  Discontinued      07/17/2021    7:00 PM 07/17/2021    7:33 PM 07/18/2021   11:00 AM 07/31/2021    1:06 PM 08/03/2021   10:47 AM  Fall Risk  Falls in the past year?    1 0  Was there an injury with Fall?    1 0  Fall Risk Category Calculator    3 0  Fall Risk Category    High Low  Patient Fall Risk Level High fall risk High fall risk High fall risk High fall risk Low fall risk  Patient at Risk for Falls Due to    History of fall(s);Impaired balance/gait;Impaired mobility;Impaired vision No Fall Risks  Fall risk Follow up    Falls evaluation completed Falls evaluation completed   Functional Status Survey:    There were no vitals filed for  this visit. There is no height or weight on file to calculate BMI.  Physical Exam  Unable to complete on telephone visit   Labs reviewed: Recent Labs    02/21/21 0407 02/22/21 0306 06/21/21 1522 07/10/21 1222 07/11/21 0300 07/11/21 1230 07/12/21 0332 07/13/21 0324 07/14/21 0355 07/15/21 0332 07/17/21 0341 07/31/21 1343  NA 134*   < >  --    < > 135   < > 135   < > 134* 134*  --  124*  K 3.5   < >  --    < > 3.1*   < > 3.3*   < > 3.8 3.8  --  5.3  CL 99   < >  --    < > 99   < > 100   < > 105 106  --  88*  CO2 27   < >  --    < > 26   < > 26   < > 24 20*  --  22  GLUCOSE 215*   < >  --    < > 155*   < > 212*   < > 123* 182*  --  677*  BUN 7   < >  --    < > 12   < > 7   < > 7 7  --  15  CREATININE 0.59   < >  --    < > 0.54   < > 0.49   < > 0.56 0.63 0.58 0.83  CALCIUM 8.6*   < >  --    < > 8.0*   < > 7.6*   < > 8.3* 8.2*  --  9.5  MG 1.7   < > 1.6  --  1.4*  --  1.7  --   --   --   --   --   PHOS 4.3  --   --   --   --   --   --   --   --   --   --   --    < > = values in this interval not displayed.   Recent Labs    04/30/21 1808 06/11/21 1500 07/10/21 1233  AST 17 17 13*  ALT 16 19 14   ALKPHOS 92 124 116  BILITOT 0.5 0.8 1.2  PROT 6.8 8.5* 8.0  ALBUMIN 3.2* 4.1 3.5   Recent Labs    06/21/21 1522 07/10/21 1222 07/10/21 1233 07/11/21 1230 07/13/21 0324 07/31/21 1343  WBC 7.2  --  22.8* 14.9* 10.4 8.8  NEUTROABS 4,140  --  19.9*  --   --  6,072  HGB 14.4   < > 14.5 11.0* 10.0* 13.4  HCT 44.8   < > 43.0 32.7* 30.0* 44.2  MCV 80.6  --  77.3* 77.7* 77.3* 85.2  PLT 394  --  364 280 279 503*   < > = values in this interval not displayed.   Lab Results  Component Value Date   TSH 1.072 06/11/2021   Lab Results  Component Value Date   HGBA1C 8.4 (H) 04/24/2021   Lab Results  Component Value Date   CHOL 183 04/24/2021   HDL 49 (L) 04/24/2021   LDLCALC 108 (H) 04/24/2021   TRIG 143 04/24/2021   CHOLHDL 3.7 04/24/2021    Significant Diagnostic  Results in last 30 days:  CT Head Wo Contrast  Result Date: 07/10/2021 CLINICAL DATA:  Altered mental status. Mental status change of unknown cause. Headache and vomiting. Chronic left-sided weakness. EXAM: CT HEAD WITHOUT CONTRAST TECHNIQUE: Contiguous axial images were obtained from the base of the skull through the vertex without intravenous contrast. RADIATION DOSE REDUCTION: This exam was performed according to the departmental dose-optimization program which includes automated exposure control, adjustment of the mA and/or kV according to patient size and/or use of iterative reconstruction technique. COMPARISON:  Head CT and brain MRI 06/11/2021 FINDINGS: Brain: No acute intracranial hemorrhage. There is no evidence of acute ischemia. Remote lacunar infarcts within bilateral basal ganglia and remote left cerebellar infarct, unchanged. No hydrocephalus. Periventricular and deep white matter hypodensity typical of chronic small vessel ischemia, stable. Vascular: Atherosclerosis of skullbase vasculature without hyperdense vessel or abnormal calcification. Skull: No fracture or focal lesion. Sinuses/Orbits: Paranasal sinuses and mastoid air cells are clear. The visualized orbits are unremarkable. Other: None. IMPRESSION: 1. No acute intracranial abnormality. 2. Remote left cerebellar infarct, chronic basal gangliar lacunar infarcts and age advanced chronic small vessel ischemia. Electronically Signed   By: Keith Rake M.D.   On: 07/10/2021 16:18   CT ABDOMEN PELVIS WO CONTRAST  Result Date: 07/10/2021 CLINICAL DATA:  Nausea and vomiting since Friday. EXAM: CT ABDOMEN AND PELVIS WITHOUT CONTRAST TECHNIQUE: Multidetector CT imaging of the abdomen and pelvis was performed following the standard protocol without IV contrast. RADIATION DOSE REDUCTION: This exam was performed according to the departmental dose-optimization program which includes automated exposure control, adjustment of the mA and/or kV  according to patient size and/or use of iterative reconstruction technique. COMPARISON:  Jun 11, 2021 CT abdomen pelvis FINDINGS: Lower chest: Bibasilar atelectasis versus scarring. Prior median sternotomy and CABG. Hepatobiliary: Unremarkable noncontrast appearance of the hepatic parenchyma. Gallbladder is unremarkable. No biliary ductal dilation. Pancreas: Mild stranding along the pancreatic head may reflect pancreatitis recommend correlation with serum lipase. No pancreatic ductal dilation. Spleen: No splenomegaly. 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 Mild thickening of the left adrenal gland without discrete nodularity is similar prior. No hydronephrosis. No renal, ureteral or bladder calculi. Urinary bladder is unremarkable for degree of distension. Stomach/Bowel: No radiopaque enteric contrast material was administered. Small hiatal hernia otherwise the stomach is unremarkable for degree of distension. No pathologic dilation of small or  large bowel. Terminal ileum appears normal. The appendix is not confidently identified however there is no pericecal inflammation. Evidence of acute bowel inflammation. Vascular/Lymphatic: Extensive aortic and branch vessel atherosclerosis without abdominal aortic aneurysm. No pathologically enlarged abdominal or pelvic lymph nodes. Reproductive: Status post hysterectomy. No adnexal masses. Other: No significant abdominopelvic free fluid. No pneumoperitoneum. Similar size of the heterogeneous soft tissue nodularity in the anterior abdominal wall which measures 3.3 x 2.0 cm on image 84/4 previously 3.4 x 2.2 cm, and appears to be at the level of patient's known C-section scar. Musculoskeletal: No acute osseous abnormality. IMPRESSION: 1. No acute abnormality in the abdomen or pelvis on this noncontrast CT, specifically no evidence of bowel obstruction or acute bowel inflammation. 2. No significant interval change 3.3 cm heterogeneous soft tissue  density within the deep subcutaneous soft tissues of the lower anterior abdominal wall potentially related to prior surgery. 3. Prior right adrenalectomy. Electronically Signed   By: Dahlia Bailiff M.D.   On: 07/10/2021 14:35   [place] EKG SITE RITE  Result Date: 07/10/2021 If Site Rite image not attached, placement could not be confirmed due to current cardiac rhythm.  DG Abd Portable 1 View  Result Date: 07/10/2021 CLINICAL DATA:  Nausea and vomiting EXAM: PORTABLE ABDOMEN - 1 VIEW COMPARISON:  02/14/2021 FINDINGS: The bowel gas pattern is normal. No radio-opaque calculi or other significant radiographic abnormality are seen. Age advanced aortoiliac atherosclerotic calcification. IMPRESSION: 1. Nonobstructive bowel gas pattern. 2. Age advanced aortoiliac atherosclerotic calcification. Electronically Signed   By: Davina Poke D.O.   On: 07/10/2021 12:43   DG Chest Portable 1 View  Result Date: 07/10/2021 CLINICAL DATA:  Altered mental status, vomiting. EXAM: PORTABLE CHEST 1 VIEW COMPARISON:  Jun 11, 2021 FINDINGS: Postsurgical changes from CABG. Cardiomediastinal silhouette is normal. Mediastinal contours appear intact. Streaky perihilar and peribronchial opacities in bilateral lower lobes. Low lung volumes. Osseous structures are without acute abnormality. Soft tissues are grossly normal. IMPRESSION: Streaky perihilar and peribronchial opacities in bilateral lower lobes may represent atelectasis or peribronchial airspace consolidation. Electronically Signed   By: Fidela Salisbury M.D.   On: 07/10/2021 12:42    Assessment/Plan  Urine retention Reports no fever or chills -Voided 3 times yesterday but none through the night this morning.  Has lower abdominal tenderness.  Drinking plenty of fluids. -Suspect possible urinary retention. -Recommended evaluation in the urgent care or ED for bladder scanning to evaluate for urinary retention. Recommendation discussed with patient and husband  Family/  staff Communication: Reviewed plan of care with patient and husband verbalized understanding  Labs/tests ordered: None   Next Appointment:As needed if symptoms worsen or fail to improve   I connected with  [name] on 08/03/21 by a video enabled telemedicine application and verified that I am speaking with the correct person using two identifiers.   I discussed the limitations of evaluation and management by telemedicine. The patient expressed understanding and agreed to proceed.   Spent 11 minutes of non-face to face with patient  >50% time spent counseling; reviewing medical record and recommending urgent care or ED evaluation for possible urinary retention.   [name], NP

## 2021-08-03 NOTE — Telephone Encounter (Signed)
Patient had her husband drop off a Insurance underwriter book of services that they received in the mail from her insurance and asked that Webb Silversmith fill out Forms to have more [place].   Book was given to [facility] by [employer] staff. Dinah came to me and stated that there was nothing in the book to fill out and if she is wanting more [place] [employer], she would need to be evaluated by the [place] going to her home and then they would fax [place] orders to sign.    Tried calling patient with Information and had to [facility] to return call.

## 2021-08-08 ENCOUNTER — Telehealth: Payer: Self-pay | Admitting: Adult Health

## 2021-08-08 NOTE — Telephone Encounter (Signed)
LVM and mychart msg informing pt of need to reschedule 7/20 appointment - NP out

## 2021-08-10 ENCOUNTER — Telehealth: Payer: Self-pay

## 2021-08-10 NOTE — Telephone Encounter (Signed)
Please complete FL 2 medication on computer then print to be signed.

## 2021-08-10 NOTE — Telephone Encounter (Signed)
Need FL2 form filled out and faxed to scoial worker Astronomer at [employer]. She says we should have the fax number. They need to say she need rehab because she does. She is trying to get in a nursing and rehab.

## 2021-08-10 NOTE — Telephone Encounter (Signed)
Forwarding to clinical intake due to time of response. Will address as soon as possible.

## 2021-08-16 ENCOUNTER — Ambulatory Visit: Payer: Medicaid Other | Admitting: Physical Medicine & Rehabilitation

## 2021-08-16 ENCOUNTER — Encounter: Payer: Medicare Other | Admitting: Family

## 2021-08-16 NOTE — Progress Notes (Signed)
  This encounter was created in error - please disregard. No show 

## 2021-08-22 ENCOUNTER — Other Ambulatory Visit: Payer: Medicaid Other

## 2021-08-23 ENCOUNTER — Ambulatory Visit: Payer: Medicare Other | Admitting: Adult Health

## 2021-08-24 ENCOUNTER — Emergency Department (HOSPITAL_COMMUNITY): Payer: Medicare Other

## 2021-08-24 ENCOUNTER — Inpatient Hospital Stay (HOSPITAL_COMMUNITY)
Admission: EM | Admit: 2021-08-24 | Discharge: 2021-08-30 | DRG: 202 | Disposition: A | Discharge status: Skilled Nursing Facility | Payer: Medicare Other | Attending: Internal Medicine | Admitting: Internal Medicine

## 2021-08-24 ENCOUNTER — Other Ambulatory Visit: Payer: Self-pay

## 2021-08-24 ENCOUNTER — Encounter (HOSPITAL_COMMUNITY): Payer: Self-pay

## 2021-08-24 DIAGNOSIS — R0602 Shortness of breath: Secondary | ICD-10-CM | POA: Diagnosis not present

## 2021-08-24 DIAGNOSIS — Z832 Family history of diseases of the blood and blood-forming organs and certain disorders involving the immune mechanism: Secondary | ICD-10-CM

## 2021-08-24 DIAGNOSIS — Z91148 Patient's other noncompliance with medication regimen for other reason: Secondary | ICD-10-CM

## 2021-08-24 DIAGNOSIS — R112 Nausea with vomiting, unspecified: Secondary | ICD-10-CM | POA: Diagnosis present

## 2021-08-24 DIAGNOSIS — I69391 Dysphagia following cerebral infarction: Secondary | ICD-10-CM

## 2021-08-24 DIAGNOSIS — J209 Acute bronchitis, unspecified: Principal | ICD-10-CM | POA: Diagnosis present

## 2021-08-24 DIAGNOSIS — Z79899 Other long term (current) drug therapy: Secondary | ICD-10-CM

## 2021-08-24 DIAGNOSIS — R739 Hyperglycemia, unspecified: Secondary | ICD-10-CM

## 2021-08-24 DIAGNOSIS — Z8673 Personal history of transient ischemic attack (TIA), and cerebral infarction without residual deficits: Secondary | ICD-10-CM | POA: Diagnosis not present

## 2021-08-24 DIAGNOSIS — Z825 Family history of asthma and other chronic lower respiratory diseases: Secondary | ICD-10-CM

## 2021-08-24 DIAGNOSIS — K219 Gastro-esophageal reflux disease without esophagitis: Secondary | ICD-10-CM | POA: Diagnosis present

## 2021-08-24 DIAGNOSIS — R1310 Dysphagia, unspecified: Secondary | ICD-10-CM | POA: Diagnosis present

## 2021-08-24 DIAGNOSIS — E1169 Type 2 diabetes mellitus with other specified complication: Secondary | ICD-10-CM | POA: Diagnosis present

## 2021-08-24 DIAGNOSIS — I6930 Unspecified sequelae of cerebral infarction: Secondary | ICD-10-CM

## 2021-08-24 DIAGNOSIS — K5900 Constipation, unspecified: Secondary | ICD-10-CM | POA: Diagnosis not present

## 2021-08-24 DIAGNOSIS — E860 Dehydration: Secondary | ICD-10-CM | POA: Diagnosis present

## 2021-08-24 DIAGNOSIS — I2510 Atherosclerotic heart disease of native coronary artery without angina pectoris: Secondary | ICD-10-CM | POA: Diagnosis present

## 2021-08-24 DIAGNOSIS — Z20822 Contact with and (suspected) exposure to covid-19: Secondary | ICD-10-CM | POA: Diagnosis present

## 2021-08-24 DIAGNOSIS — I10 Essential (primary) hypertension: Secondary | ICD-10-CM | POA: Diagnosis not present

## 2021-08-24 DIAGNOSIS — Z8249 Family history of ischemic heart disease and other diseases of the circulatory system: Secondary | ICD-10-CM

## 2021-08-24 DIAGNOSIS — Z888 Allergy status to other drugs, medicaments and biological substances status: Secondary | ICD-10-CM

## 2021-08-24 DIAGNOSIS — Z880 Allergy status to penicillin: Secondary | ICD-10-CM

## 2021-08-24 DIAGNOSIS — Z7984 Long term (current) use of oral hypoglycemic drugs: Secondary | ICD-10-CM

## 2021-08-24 DIAGNOSIS — R7989 Other specified abnormal findings of blood chemistry: Secondary | ICD-10-CM | POA: Diagnosis present

## 2021-08-24 DIAGNOSIS — R9431 Abnormal electrocardiogram [ECG] [EKG]: Secondary | ICD-10-CM | POA: Diagnosis present

## 2021-08-24 DIAGNOSIS — Z7902 Long term (current) use of antithrombotics/antiplatelets: Secondary | ICD-10-CM

## 2021-08-24 DIAGNOSIS — E1165 Type 2 diabetes mellitus with hyperglycemia: Secondary | ICD-10-CM | POA: Diagnosis present

## 2021-08-24 DIAGNOSIS — Z833 Family history of diabetes mellitus: Secondary | ICD-10-CM

## 2021-08-24 DIAGNOSIS — E876 Hypokalemia: Secondary | ICD-10-CM | POA: Diagnosis not present

## 2021-08-24 DIAGNOSIS — E119 Type 2 diabetes mellitus without complications: Secondary | ICD-10-CM | POA: Diagnosis present

## 2021-08-24 DIAGNOSIS — G9341 Metabolic encephalopathy: Secondary | ICD-10-CM | POA: Diagnosis present

## 2021-08-24 DIAGNOSIS — Z951 Presence of aortocoronary bypass graft: Secondary | ICD-10-CM

## 2021-08-24 DIAGNOSIS — Z91041 Radiographic dye allergy status: Secondary | ICD-10-CM

## 2021-08-24 DIAGNOSIS — E0590 Thyrotoxicosis, unspecified without thyrotoxic crisis or storm: Secondary | ICD-10-CM | POA: Diagnosis present

## 2021-08-24 DIAGNOSIS — K56609 Unspecified intestinal obstruction, unspecified as to partial versus complete obstruction: Secondary | ICD-10-CM

## 2021-08-24 DIAGNOSIS — F32A Depression, unspecified: Secondary | ICD-10-CM | POA: Diagnosis present

## 2021-08-24 DIAGNOSIS — E785 Hyperlipidemia, unspecified: Secondary | ICD-10-CM | POA: Diagnosis present

## 2021-08-24 DIAGNOSIS — I69354 Hemiplegia and hemiparesis following cerebral infarction affecting left non-dominant side: Secondary | ICD-10-CM

## 2021-08-24 DIAGNOSIS — R000 Tachycardia, unspecified: Secondary | ICD-10-CM | POA: Diagnosis not present

## 2021-08-24 DIAGNOSIS — R197 Diarrhea, unspecified: Secondary | ICD-10-CM | POA: Diagnosis not present

## 2021-08-24 DIAGNOSIS — Z794 Long term (current) use of insulin: Secondary | ICD-10-CM

## 2021-08-24 HISTORY — DX: Acute bronchitis, unspecified: J20.9

## 2021-08-24 LAB — GLUCOSE, CAPILLARY: Glucose-Capillary: 338 mg/dL — ABNORMAL HIGH (ref 70–99)

## 2021-08-24 LAB — CBC WITH DIFFERENTIAL/PLATELET
Abs Immature Granulocytes: 0.03 10*3/uL (ref 0.00–0.07)
Basophils Absolute: 0 10*3/uL (ref 0.0–0.1)
Basophils Relative: 0 %
Eosinophils Absolute: 0.1 10*3/uL (ref 0.0–0.5)
Eosinophils Relative: 2 %
HCT: 42.6 % (ref 36.0–46.0)
Hemoglobin: 14 g/dL (ref 12.0–15.0)
Immature Granulocytes: 0 %
Lymphocytes Relative: 24 %
Lymphs Abs: 2.1 10*3/uL (ref 0.7–4.0)
MCH: 25.7 pg — ABNORMAL LOW (ref 26.0–34.0)
MCHC: 32.9 g/dL (ref 30.0–36.0)
MCV: 78.3 fL — ABNORMAL LOW (ref 80.0–100.0)
Monocytes Absolute: 0.5 10*3/uL (ref 0.1–1.0)
Monocytes Relative: 5 %
Neutro Abs: 6.1 10*3/uL (ref 1.7–7.7)
Neutrophils Relative %: 69 %
Platelets: 366 10*3/uL (ref 150–400)
RBC: 5.44 MIL/uL — ABNORMAL HIGH (ref 3.87–5.11)
RDW: 15.3 % (ref 11.5–15.5)
WBC: 8.9 10*3/uL (ref 4.0–10.5)
nRBC: 0 % (ref 0.0–0.2)

## 2021-08-24 LAB — BASIC METABOLIC PANEL
Anion gap: 12 (ref 5–15)
BUN: 10 mg/dL (ref 6–20)
CO2: 23 mmol/L (ref 22–32)
Calcium: 9.2 mg/dL (ref 8.9–10.3)
Chloride: 95 mmol/L — ABNORMAL LOW (ref 98–111)
Creatinine, Ser: 0.89 mg/dL (ref 0.44–1.00)
GFR, Estimated: >60 mL/min (ref >60)
Glucose, Bld: 538 mg/dL (ref 70–99)
Potassium: 4.5 mmol/L (ref 3.5–5.1)
Sodium: 130 mmol/L — ABNORMAL LOW (ref 135–145)

## 2021-08-24 LAB — LACTIC ACID, PLASMA
Lactic Acid, Venous: 2.8 mmol/L (ref 0.5–1.9)
Lactic Acid, Venous: 1.7 mmol/L (ref 0.5–1.9)

## 2021-08-24 LAB — PROCALCITONIN: Procalcitonin: <0.1 ng/mL

## 2021-08-24 LAB — CBG MONITORING, ED
Glucose-Capillary: 554 mg/dL (ref 70–99)
Glucose-Capillary: 387 mg/dL — ABNORMAL HIGH (ref 70–99)

## 2021-08-24 LAB — RESP PANEL BY RT-PCR (FLU A&B, COVID) ARPGX2
Influenza A by PCR: NEGATIVE
Influenza B by PCR: NEGATIVE
SARS Coronavirus 2 by RT PCR: NEGATIVE

## 2021-08-24 MED ORDER — SODIUM CHLORIDE 0.9 % IV SOLN
500.0000 mg | Freq: Once | INTRAVENOUS | Status: AC
  Administered 2021-08-24: 500 mg via INTRAVENOUS
  Filled 2021-08-24: qty 5

## 2021-08-24 MED ORDER — LACTATED RINGERS IV SOLN: Freq: Once | INTRAVENOUS | Status: AC

## 2021-08-24 MED ORDER — INSULIN ASPART 100 UNIT/ML IJ SOLN
0.0000 [IU] | Freq: Every day | INTRAMUSCULAR | Status: DC
  Administered 2021-08-24 – 2021-08-25 (×2): 4 [IU] via SUBCUTANEOUS
  Administered 2021-08-26: 3 [IU] via SUBCUTANEOUS
  Administered 2021-08-29: 4 [IU] via SUBCUTANEOUS
  Filled 2021-08-24: qty 0.05

## 2021-08-24 MED ORDER — INSULIN ASPART 100 UNIT/ML IJ SOLN
0.0000 [IU] | Freq: Three times a day (TID) | INTRAMUSCULAR | Status: DC
  Administered 2021-08-25: 8 [IU] via SUBCUTANEOUS
  Administered 2021-08-25: 5 [IU] via SUBCUTANEOUS
  Administered 2021-08-25: 15 [IU] via SUBCUTANEOUS
  Administered 2021-08-26: 5 [IU] via SUBCUTANEOUS
  Administered 2021-08-26: 2 [IU] via SUBCUTANEOUS
  Administered 2021-08-26 – 2021-08-27 (×2): 11 [IU] via SUBCUTANEOUS
  Administered 2021-08-27: 5 [IU] via SUBCUTANEOUS
  Administered 2021-08-28 (×2): 8 [IU] via SUBCUTANEOUS
  Administered 2021-08-28: 5 [IU] via SUBCUTANEOUS
  Administered 2021-08-29: 8 [IU] via SUBCUTANEOUS
  Administered 2021-08-29: 11 [IU] via SUBCUTANEOUS
  Administered 2021-08-29 – 2021-08-30 (×2): 5 [IU] via SUBCUTANEOUS
  Filled 2021-08-24: qty 0.15

## 2021-08-24 MED ORDER — INSULIN GLARGINE-YFGN 100 UNIT/ML ~~LOC~~ SOLN
30.0000 [IU] | Freq: Every day | SUBCUTANEOUS | Status: DC
  Administered 2021-08-24 – 2021-08-28 (×5): 30 [IU] via SUBCUTANEOUS
  Filled 2021-08-24 (×5): qty 0.3

## 2021-08-24 MED ORDER — SODIUM CHLORIDE 0.9 % IV SOLN
1.0000 g | Freq: Once | INTRAVENOUS | Status: AC
  Administered 2021-08-24: 1 g via INTRAVENOUS
  Filled 2021-08-24: qty 10

## 2021-08-24 MED ORDER — MELATONIN 3 MG PO TABS
3.0000 mg | ORAL_TABLET | Freq: Every day | ORAL | Status: DC
Start: 2021-08-25 — End: 2021-08-30
  Administered 2021-08-24 – 2021-08-29 (×6): 3 mg via ORAL
  Filled 2021-08-24 (×6): qty 1

## 2021-08-24 MED ORDER — ACETAMINOPHEN 325 MG PO TABS
650.0000 mg | ORAL_TABLET | Freq: Four times a day (QID) | ORAL | Status: DC | PRN
  Administered 2021-08-25 – 2021-08-26 (×2): 650 mg via ORAL
  Filled 2021-08-24 (×3): qty 2

## 2021-08-24 MED ORDER — ENOXAPARIN SODIUM 40 MG/0.4ML IJ SOSY
40.0000 mg | PREFILLED_SYRINGE | INTRAMUSCULAR | Status: DC
  Administered 2021-08-24 – 2021-08-25 (×2): 40 mg via SUBCUTANEOUS
  Filled 2021-08-24 (×6): qty 0.4

## 2021-08-24 MED ORDER — ATORVASTATIN CALCIUM 40 MG PO TABS
80.0000 mg | ORAL_TABLET | Freq: Every day | ORAL | Status: DC
  Administered 2021-08-25 – 2021-08-29 (×5): 80 mg via ORAL
  Filled 2021-08-24 (×6): qty 2

## 2021-08-24 MED ORDER — LORAZEPAM 0.5 MG PO TABS
0.5000 mg | ORAL_TABLET | Freq: Every day | ORAL | Status: DC
Start: 2021-08-25 — End: 2021-08-30
  Administered 2021-08-24 – 2021-08-29 (×4): 0.5 mg via ORAL
  Filled 2021-08-24 (×5): qty 1

## 2021-08-24 MED ORDER — INSULIN ASPART 100 UNIT/ML IJ SOLN
8.0000 [IU] | Freq: Once | INTRAMUSCULAR | Status: AC
  Administered 2021-08-24: 8 [IU] via SUBCUTANEOUS
  Filled 2021-08-24: qty 0.08

## 2021-08-24 MED ORDER — ONDANSETRON HCL 4 MG/2ML IJ SOLN: 4.0000 mg | Freq: Four times a day (QID) | INTRAMUSCULAR | Status: DC | PRN

## 2021-08-24 MED ORDER — SODIUM CHLORIDE 0.9 % IV BOLUS
1000.0000 mL | Freq: Once | INTRAVENOUS | Status: AC
  Administered 2021-08-24: 1000 mL via INTRAVENOUS

## 2021-08-24 MED ORDER — ACETAMINOPHEN 650 MG RE SUPP: 650.0000 mg | Freq: Four times a day (QID) | RECTAL | Status: DC | PRN

## 2021-08-24 MED ORDER — ONDANSETRON HCL 4 MG PO TABS: 4.0000 mg | ORAL_TABLET | Freq: Four times a day (QID) | ORAL | Status: DC | PRN

## 2021-08-24 MED ORDER — CLOPIDOGREL BISULFATE 75 MG PO TABS
75.0000 mg | ORAL_TABLET | Freq: Every day | ORAL | Status: DC
  Administered 2021-08-25 – 2021-08-30 (×6): 75 mg via ORAL
  Filled 2021-08-24 (×6): qty 1

## 2021-08-24 NOTE — Assessment & Plan Note (Addendum)
Poorly controlled with hyperglycemia today. Poor control likely due to non-adherence to meds (pt admitted this to providers recently) 1. Resume lantus 2. Mod scale SSI AC/HS 3. Remainder of med-rec pending

## 2021-08-24 NOTE — Assessment & Plan Note (Signed)
Corrected sodium = 141

## 2021-08-24 NOTE — ED Provider Triage Note (Signed)
Emergency Medicine Provider Triage Evaluation Note  [facility] , a 51 y.o. female  was evaluated in triage.  Pt complains of trouble breathing, cough. Exposed to her aid who has bronchitis, is concerned she has bronchitis now. Brought in by EMS Review of Systems  Positive: cough Negative: fever  Physical Exam  BP (!) 147/106   Pulse (!) 117   Temp 98.3 F (36.8 C) (Oral)   Resp 17   Ht '5\' 2"'$  (1.575 m)   Wt 84.8 kg   SpO2 97%   BMI 34.20 kg/m  Gen:   Awake, no distress   Resp:  Normal effort  MSK:   Moves extremities without difficulty  Other:  Speaks in complete sentences, resp even and unlabored  Medical Decision Making  Medically screening exam initiated at 1:48 PM.  Appropriate orders placed.  [employer] was informed that the remainder of the evaluation will be completed by another provider, this initial triage assessment does not replace that evaluation, and the importance of remaining in the ED until their evaluation is complete.     Tacy Learn, PA-C 08/24/21 1349

## 2021-08-24 NOTE — H&P (Signed)
History and Physical    Patient: Lori [name] ZHY:[id number] DOB: April 27, 1970 DOA: 08/24/2021 DOS: the patient was seen and examined on 08/24/2021 PCP: Ngetich, Nelda Bucks, NP  Patient coming from: Home  Chief Complaint:  Chief Complaint  Patient presents with   Shortness of Breath   HPI: Lori [name] is a 51 y.o. female with medical history significant of poorly controlled DM, CAD, stroke with L sided hemiparesis.  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  This is more pronounced in her left upper extremity.She is not able to walk at baseline.  She does live at home and has nursing care 3 hours out of the day.   Pt with recent admit to hospital 6/6-6/14 for HHS in setting of DM med non-adherence.  During that admit pt also had aspiration PNA.  She is on a Dysphagia 3 (mechanical soft) diet at baseline with thin liquids.  Pt presents to ED today with c/o cough, sputum production, mild increased SOB over past 2 days or so.  Patient reports that her aide has bronchitis and she is concerned that she may have caught her symptoms from her aide.  Patient is able to answer questions although her speech is slow and answers are very deliberate.    Review of Systems: As mentioned in the history of present illness. All other systems reviewed and are negative. Past Medical History:  Diagnosis Date   CAD (coronary artery disease)    Depression    Diabetes mellitus without complication ([place])    History of CT scan    History of mammogram    History of MRI    Hypertension    Pheochromocytoma    Pheochromocytoma    Stroke [facility])    Thyroid disease    Past Surgical History:  Procedure Laterality Date   ABDOMINAL HYSTERECTOMY     CESAREAN SECTION     3   CORONARY ARTERY BYPASS GRAFT     Right adrenal gland removal for pheochromocytoma Right    Social History:  reports that she has never smoked. She has never used smokeless tobacco. She reports that  she does not currently use alcohol. She reports that she does not use drugs.  Allergies  Allergen Reactions   Sumatriptan Anaphylaxis   Contrast Media [Iodinated Contrast Media] Swelling   Penicillins Swelling    Mouth swells up and eyes swollen shut    Family History  Problem Relation Age of Onset   Diabetes Mother    Hypertension Father    Heart attack Father    Dementia Father    Diabetes Brother    Brain cancer Brother    Asthma Daughter    Sickle cell anemia Son     Prior to Admission medications   Medication Sig Start Date End Date Taking? Authorizing Provider  acetaminophen (TYLENOL) 325 MG tablet Take 2 tablets (650 mg total) by mouth every 6 (six) hours as needed for mild pain or headache. 02/26/21   Pokhrel, Corrie Mckusick, MD  atorvastatin (LIPITOR) 80 MG tablet Take 80 mg by mouth daily. 06/25/21   [provider]  blood glucose meter kit and supplies KIT Dispense based on patient and insurance preference. Use up to four times daily as directed. 03/16/21   Love, Ivan Anchors, PA-C  butalbital-acetaminophen-caffeine (FIORICET) 50-325-40 MG tablet TAKE 1 TABLET BY MOUTH EVERY 6 HOURS AS NEEDED FOR MIGRANE 05/22/21   Ngetich, Dinah C, NP  clopidogrel (PLAVIX) 75 MG tablet TAKE 1 TABLET  BY MOUTH ONCE DAILY 05/23/21   Ngetich, Dinah C, NP  Continuous Blood Gluc Receiver (FREESTYLE LIBRE 2 READER) DEVI Use to check blood sugar 3 times daily. 04/19/21   Charlott Rakes, MD  Continuous Blood Gluc Sensor (FREESTYLE LIBRE 2 SENSOR) MISC Use to check blood sugar 3 times daily. 04/19/21   Charlott Rakes, MD  diclofenac Sodium (VOLTAREN) 1 % GEL Apply 4 g topically 4 (four) times daily. 03/15/21   Love, Ivan Anchors, PA-C  empagliflozin (JARDIANCE) 10 MG TABS tablet Take 1 tablet (10 mg total) by mouth daily before breakfast. 06/25/21   Early Osmond, MD  escitalopram (LEXAPRO) 20 MG tablet TAKE 1 TABLET BY MOUTH EVERY MORNING 05/23/21   Ngetich, Dinah C, NP  ezetimibe (ZETIA) 10 MG tablet Take 1  tablet (10 mg total) by mouth daily. 05/23/21   Garvin Fila, MD  gabapentin (NEURONTIN) 100 MG capsule TAKE 1 CAPSULE BY MOUTH THREE TIMES DAILY (BREAKFAST, LUNCH, BEDTIME) 05/23/21   Ngetich, Dinah C, NP  hydrOXYzine (ATARAX) 25 MG tablet Take 1 tablet (25 mg total) by mouth at bedtime. 06/21/21   Ngetich, Dinah C, NP  Insulin Aspart FlexPen (NOVOLOG) 100 UNIT/ML ADMINISTER 4 UNITS UNDER THE SKIN THREE TIMES DAILY WITH MEALS 07/09/21   Ngetich, Dinah C, NP  insulin glargine (LANTUS SOLOSTAR) 100 UNIT/ML Solostar Pen Inject 30 Units into the skin at bedtime. 08/01/21   Ngetich, Dinah C, NP  Insulin Pen Needle 32G X 4 MM MISC Use to inject insulin 4 times daily as directed. 03/15/21   Love, Ivan Anchors, PA-C  lip balm (CARMEX) ointment Apply topically as needed for lip care. 03/16/21   Love, Ivan Anchors, PA-C  loratadine (CLARITIN) 10 MG tablet Take 1 tablet (10 mg total) by mouth daily. 03/15/21   Love, Ivan Anchors, PA-C  LORazepam (ATIVAN) 0.5 MG tablet TAKE 1 TABLET BY MOUTH AT BEDTIME 05/23/21   Ngetich, Dinah C, NP  melatonin 3 MG TABS tablet Take 3 tablets (9 mg total) by mouth at bedtime. 04/20/21   Ngetich, Dinah C, NP  methimazole (TAPAZOLE) 5 MG tablet TAKE 1 TABLET BY MOUTH EVERY MORNING 05/23/21   Ngetich, Dinah C, NP  metoCLOPramide (REGLAN) 10 MG tablet Take 1 tablet (10 mg total) by mouth 3 (three) times daily before meals. 06/21/21   Ngetich, Dinah C, NP  metoprolol tartrate (LOPRESSOR) 100 MG tablet TAKE 1 TABLET BY MOUTH TWICE DAILY (BREAKFAST, BEDTIME) 05/23/21   Ngetich, Dinah C, NP  nystatin cream (MYCOSTATIN) Apply 1 Application topically 2 (two) times daily. Affected areas on breast fold and groin and sacral areas 07/31/21   Ngetich, Dinah C, NP  ondansetron (ZOFRAN) 4 MG tablet Take 1 tablet (4 mg total) by mouth every 8 (eight) hours as needed for nausea or vomiting. 07/17/21   Kayleen Memos, DO  pantoprazole (PROTONIX) 40 MG tablet TAKE 1 TABLET BY MOUTH EVERY MORNING 05/23/21   Ngetich, Dinah C, NP   polyethylene glycol powder (GLYCOLAX/MIRALAX) 17 GM/SCOOP powder Take 17 g by mouth 2 (two) times daily. 03/15/21   Love, Ivan Anchors, PA-C  rosuvastatin (CRESTOR) 40 MG tablet Take 1 tablet (40 mg total) by mouth daily. 06/25/21   Early Osmond, MD  senna (SENOKOT) 8.6 MG tablet Take 1 tablet by mouth as needed for constipation.    [provider]  tamsulosin (FLOMAX) 0.4 MG CAPS capsule TAKE 1 CAPSULE BY MOUTH AT BEDTIME 05/23/21   Ngetich, Dinah C, NP  topiramate (TOPAMAX) 25 MG tablet TAKE  1 TABLET(25 MG) BY MOUTH TWICE DAILY 06/22/21   Ngetich, Dinah C, NP  zinc oxide (BALMEX) 11.3 % CREA cream Apply 1 Application topically 2 (two) times daily. 07/31/21   Ngetich, Nelda Bucks, NP    Physical Exam: Vitals:   08/24/21 1915 08/24/21 1945 08/24/21 2000 08/24/21 2019  BP: (!) 157/117 (!) 175/117 (!) 183/117   Pulse:  (!) 112 (!) 107   Resp:  18    Temp:      TempSrc:      SpO2:  100% 100% 100%  Weight:      Height:       Constitutional: NAD, calm, comfortable Eyes: PERRL, lids and conjunctivae normal ENMT: Mucous membranes are moist. Posterior pharynx clear of any exudate or lesions.Normal dentition.  Neck: normal, supple, no masses, no thyromegaly Respiratory: Tachypnea Cardiovascular: Tachycardic Abdomen: no tenderness, no masses palpated. No hepatosplenomegaly. Bowel sounds positive.  Musculoskeletal: no clubbing / cyanosis. No joint deformity upper and lower extremities. Good ROM, no contractures. Normal muscle tone.  Skin: no rashes, lesions, ulcers. No induration Neurologic: L hemiparesis.  Answers questions appropriately though slow to respond. Psychiatric: Normal judgment and insight. Alert and oriented x 3. Normal mood.   Data Reviewed:    CBC    Component Value Date/Time   WBC 8.9 08/24/2021 1416   RBC 5.44 (H) 08/24/2021 1416   HGB 14.0 08/24/2021 1416   HCT 42.6 08/24/2021 1416   PLT 366 08/24/2021 1416   MCV 78.3 (L) 08/24/2021 1416   MCH 25.7 (L) 08/24/2021  1416   MCHC 32.9 08/24/2021 1416   RDW 15.3 08/24/2021 1416   LYMPHSABS 2.1 08/24/2021 1416   MONOABS 0.5 08/24/2021 1416   EOSABS 0.1 08/24/2021 1416   BASOSABS 0.0 08/24/2021 1416      Latest Ref Rng & Units 08/24/2021    2:16 PM 07/31/2021    1:43 PM 07/17/2021    3:41 AM  BMP  Glucose 70 - 99 mg/dL 538  677    BUN 6 - 20 mg/dL 10  15    Creatinine 0.44 - 1.00 mg/dL 0.89  0.83  0.58   BUN/Creat Ratio 6 - 22 (calc)  NOT APPLICABLE    Sodium 389 - 145 mmol/L 130  124    Potassium 3.5 - 5.1 mmol/L 4.5  5.3    Chloride 98 - 111 mmol/L 95  88    CO2 22 - 32 mmol/L 23  22    Calcium 8.9 - 10.3 mg/dL 9.2  9.5     CXR: IMPRESSION: Streaky opacities in the right upper lobe and at the left perihilar region and are more prominent in the present study of peribronchial thickening and/or airspace disease. No consolidation.  Assessment and Plan: * Acute bronchitis H/o Aspiration PNA in June, but todays CXR findings and HPI is more suggestive of bronchitis, probably viral given nl WBC, no fever, and contagiousness. EDP did give dose of rocephin + azithro in ED. COVID and flu neg Check RVP Check procalcitonin Repeat CBC/BMP in AM Monitor for development of SIRS Decide if pt needs to continue ABx based on above results.  Type 2 diabetes mellitus with hyperlipidemia (HCC) Poorly controlled with hyperglycemia today. Poor control likely due to non-adherence to meds (pt admitted this to providers recently) Resume lantus Mod scale SSI AC/HS Remainder of med-rec pending  Pseudohyponatremia Corrected sodium = 141  History of cerebral infarction Hemiplegia and speech difficulty at baseline.  Essential hypertension Resume home BP meds when med rec  complete.      Advance Care Planning:   Code Status: Full Code  Consults: None  Family Communication: No family in room  Severity of Illness: The appropriate patient status for this patient is OBSERVATION. Observation status is judged  to be reasonable and necessary in order to provide the required intensity of service to ensure the patient's safety. The patient's presenting symptoms, physical exam findings, and initial radiographic and laboratory data in the context of their medical condition is felt to place them at decreased risk for further clinical deterioration. Furthermore, it is anticipated that the patient will be medically stable for discharge from the hospital within 2 midnights of admission.   Author: Etta Quill., DO 08/24/2021 8:53 PM  For on call review [email or web address].

## 2021-08-24 NOTE — ED Triage Notes (Signed)
Pt BIB EMS from home. Pt endorses cough and SHOB x2 days. Pts home health nurse had bronchitis and she thinks she has it now. Pt CBG 573, pt is non-compliant with medication.

## 2021-08-24 NOTE — Assessment & Plan Note (Signed)
Resume home BP meds when med rec complete.

## 2021-08-24 NOTE — ED Provider Notes (Signed)
[facility] DEPT Provider Note   CSN: [id number] Arrival date & time: 08/24/21  1334     History  Chief Complaint  Patient presents with   Shortness of Breath    Lori [name] [name] is a 51 y.o. female.  51 year old female with prior medical history as detailed below presents for evaluation.  Patient reports increased cough, productive sputum, and mildly increased shortness of breath over the last 1 to 2 days.  Patient reports that her aide has bronchitis and she is concerned that she may have caught her symptoms from her aide.  Patient with significant history of diabetes, CVA, CAD.  Patient with left hemiparesis at baseline - more pronounced in the left arm.  Patient is able to answer questions although her speech is slow and answers are very deliberate.  The history is provided by the patient and medical records.  Shortness of Breath Severity:  Moderate Onset quality:  Gradual Duration:  2 days Timing:  Constant Progression:  Worsening Chronicity:  New      Home Medications Prior to Admission medications   Medication Sig Start Date End Date Taking? Authorizing Provider  acetaminophen (TYLENOL) 325 MG tablet Take 2 tablets (650 mg total) by mouth every 6 (six) hours as needed for mild pain or headache. 02/26/21   Pokhrel, Corrie Mckusick, MD  atorvastatin (LIPITOR) 80 MG tablet Take 80 mg by mouth daily. 06/25/21   [provider]  blood glucose meter kit and supplies KIT Dispense based on patient and insurance preference. Use up to four times daily as directed. 03/16/21   Love, Ivan Anchors, PA-C  butalbital-acetaminophen-caffeine (FIORICET) 50-325-40 MG tablet TAKE 1 TABLET BY MOUTH EVERY 6 HOURS AS NEEDED FOR MIGRANE 05/22/21   Ngetich, Dinah C, NP  clopidogrel (PLAVIX) 75 MG tablet TAKE 1 TABLET BY MOUTH ONCE DAILY 05/23/21   Ngetich, Dinah C, NP  Continuous Blood Gluc Receiver (FREESTYLE LIBRE 2 READER) DEVI Use to check blood sugar 3 times  daily. 04/19/21   Charlott Rakes, MD  Continuous Blood Gluc Sensor (FREESTYLE LIBRE 2 SENSOR) MISC Use to check blood sugar 3 times daily. 04/19/21   Charlott Rakes, MD  diclofenac Sodium (VOLTAREN) 1 % GEL Apply 4 g topically 4 (four) times daily. 03/15/21   Love, Ivan Anchors, PA-C  empagliflozin (JARDIANCE) 10 MG TABS tablet Take 1 tablet (10 mg total) by mouth daily before breakfast. 06/25/21   Early Osmond, MD  escitalopram (LEXAPRO) 20 MG tablet TAKE 1 TABLET BY MOUTH EVERY MORNING 05/23/21   Ngetich, Dinah C, NP  ezetimibe (ZETIA) 10 MG tablet Take 1 tablet (10 mg total) by mouth daily. 05/23/21   Garvin Fila, MD  gabapentin (NEURONTIN) 100 MG capsule TAKE 1 CAPSULE BY MOUTH THREE TIMES DAILY (BREAKFAST, LUNCH, BEDTIME) 05/23/21   Ngetich, Dinah C, NP  hydrOXYzine (ATARAX) 25 MG tablet Take 1 tablet (25 mg total) by mouth at bedtime. 06/21/21   Ngetich, Dinah C, NP  Insulin Aspart FlexPen (NOVOLOG) 100 UNIT/ML ADMINISTER 4 UNITS UNDER THE SKIN THREE TIMES DAILY WITH MEALS 07/09/21   Ngetich, Dinah C, NP  insulin glargine (LANTUS SOLOSTAR) 100 UNIT/ML Solostar Pen Inject 30 Units into the skin at bedtime. 08/01/21   Ngetich, Dinah C, NP  Insulin Pen Needle 32G X 4 MM MISC Use to inject insulin 4 times daily as directed. 03/15/21   Love, Ivan Anchors, PA-C  lip balm (CARMEX) ointment Apply topically as needed for lip care. 03/16/21   Bary Leriche, PA-C  loratadine (CLARITIN) 10 MG tablet Take 1 tablet (10 mg total) by mouth daily. 03/15/21   Love, Ivan Anchors, PA-C  LORazepam (ATIVAN) 0.5 MG tablet TAKE 1 TABLET BY MOUTH AT BEDTIME 05/23/21   Ngetich, Dinah C, NP  melatonin 3 MG TABS tablet Take 3 tablets (9 mg total) by mouth at bedtime. 04/20/21   Ngetich, Dinah C, NP  methimazole (TAPAZOLE) 5 MG tablet TAKE 1 TABLET BY MOUTH EVERY MORNING 05/23/21   Ngetich, Dinah C, NP  metoCLOPramide (REGLAN) 10 MG tablet Take 1 tablet (10 mg total) by mouth 3 (three) times daily before meals. 06/21/21   Ngetich, Dinah C, NP   metoprolol tartrate (LOPRESSOR) 100 MG tablet TAKE 1 TABLET BY MOUTH TWICE DAILY (BREAKFAST, BEDTIME) 05/23/21   Ngetich, Dinah C, NP  nystatin cream (MYCOSTATIN) Apply 1 Application topically 2 (two) times daily. Affected areas on breast fold and groin and sacral areas 07/31/21   Ngetich, Dinah C, NP  ondansetron (ZOFRAN) 4 MG tablet Take 1 tablet (4 mg total) by mouth every 8 (eight) hours as needed for nausea or vomiting. 07/17/21   Kayleen Memos, DO  pantoprazole (PROTONIX) 40 MG tablet TAKE 1 TABLET BY MOUTH EVERY MORNING 05/23/21   Ngetich, Dinah C, NP  polyethylene glycol powder (GLYCOLAX/MIRALAX) 17 GM/SCOOP powder Take 17 g by mouth 2 (two) times daily. 03/15/21   Love, Ivan Anchors, PA-C  rosuvastatin (CRESTOR) 40 MG tablet Take 1 tablet (40 mg total) by mouth daily. 06/25/21   Early Osmond, MD  senna (SENOKOT) 8.6 MG tablet Take 1 tablet by mouth as needed for constipation.    [provider]  tamsulosin (FLOMAX) 0.4 MG CAPS capsule TAKE 1 CAPSULE BY MOUTH AT BEDTIME 05/23/21   Ngetich, Dinah C, NP  topiramate (TOPAMAX) 25 MG tablet TAKE 1 TABLET(25 MG) BY MOUTH TWICE DAILY 06/22/21   Ngetich, Dinah C, NP  zinc oxide (BALMEX) 11.3 % CREA cream Apply 1 Application topically 2 (two) times daily. 07/31/21   Ngetich, Dinah C, NP      Allergies    Sumatriptan, Contrast media [iodinated contrast media], and Penicillins    Review of Systems   Review of Systems  Respiratory:  Positive for shortness of breath.   All other systems reviewed and are negative.   Physical Exam Updated Vital Signs BP (!) 134/102   Pulse 98   Temp 98.3 F (36.8 C) (Oral)   Resp 18   Ht 5' 2"  (1.575 m)   Wt 84.8 kg   SpO2 97%   BMI 34.20 kg/m  Physical Exam Vitals and nursing note reviewed.  Constitutional:      General: She is not in acute distress.    Appearance: Normal appearance. She is well-developed.  HENT:     Head: Normocephalic and atraumatic.  Eyes:     Conjunctiva/sclera: Conjunctivae  normal.     Pupils: Pupils are equal, round, and reactive to light.  Cardiovascular:     Rate and Rhythm: Normal rate and regular rhythm.     Heart sounds: Normal heart sounds.  Pulmonary:     Effort: Tachypnea present. No respiratory distress.     Breath sounds: Normal breath sounds.  Abdominal:     General: There is no distension.     Palpations: Abdomen is soft.     Tenderness: There is no abdominal tenderness.  Musculoskeletal:        General: No deformity. Normal range of motion.     Cervical back: Normal range  of motion and neck supple.  Skin:    General: Skin is warm and dry.  Neurological:     Mental Status: She is alert.     Comments: Return, answers questions appropriately, slow to respond, this appears to be baseline.  Left hemiparesis noted, this appears to be at baseline.     ED Results / Procedures / Treatments   Labs (all labs ordered are listed, but only abnormal results are displayed) Labs Reviewed  CBC WITH DIFFERENTIAL/PLATELET - Abnormal; Notable for the following components:      Result Value   RBC 5.44 (*)    MCV 78.3 (*)    MCH 25.7 (*)    All other components within normal limits  BASIC METABOLIC PANEL - Abnormal; Notable for the following components:   Sodium 130 (*)    Chloride 95 (*)    Glucose, Bld 538 (*)    All other components within normal limits  LACTIC ACID, PLASMA - Abnormal; Notable for the following components:   Lactic Acid, Venous 2.8 (*)    All other components within normal limits  CBG MONITORING, ED - Abnormal; Notable for the following components:   Glucose-Capillary 554 (*)    All other components within normal limits  CULTURE, BLOOD (ROUTINE X 2)  CULTURE, BLOOD (ROUTINE X 2)  RESP PANEL BY RT-PCR (FLU A&B, COVID) ARPGX2  BLOOD GAS, VENOUS  LACTIC ACID, PLASMA    EKG EKG Interpretation  Date/Time:  Friday August 24 2021 13:43:24 EDT Ventricular Rate:  112 PR Interval:  78 QRS Duration: 86 QT Interval:  459 QTC  Calculation: 627 R Axis:   63 Text Interpretation: Sinus or ectopic atrial tachycardia Nonspecific repol abnormality, diffuse leads Prolonged QT interval Confirmed by Dene Gentry [phone]) on 08/24/2021 2:55:09 PM  Radiology DG Chest 2 View  Result Date: 08/24/2021 CLINICAL DATA:  shob EXAM: CHEST - 2 VIEW COMPARISON:  July 10, 2021 FINDINGS: The heart size and mediastinal contours are within normal limits. Again seen are sternotomy wires and mediastinal surgical clips. There are streaky opacities seen in the right upper lobe and left perihilar region and are more prominent in the present study. Linear scar at the right lower lung. No consolidation, pleural effusion or vascular congestion. The visualized skeletal structures are unremarkable. IMPRESSION: Streaky opacities in the right upper lobe and at the left perihilar region and are more prominent in the present study of peribronchial thickening and/or airspace disease. No consolidation. Electronically Signed   By: Frazier Richards M.D.   On: 08/24/2021 14:41    Procedures Procedures    Medications Ordered in ED Medications  cefTRIAXone (ROCEPHIN) 1 g in sodium chloride 0.9 % 100 mL IVPB (has no administration in time range)  azithromycin (ZITHROMAX) 500 mg in sodium chloride 0.9 % 250 mL IVPB (has no administration in time range)  sodium chloride 0.9 % bolus 1,000 mL (1,000 mLs Intravenous New Bag/Given 08/24/21 1601)  insulin aspart (novoLOG) injection 8 Units (8 Units Subcutaneous Given 08/24/21 1520)    ED Course/ Medical Decision Making/ A&P                           Medical Decision Making Amount and/or Complexity of Data Reviewed Labs: ordered. Radiology: ordered.  Risk Prescription drug management. Decision regarding hospitalization.    Medical Screen Complete  This patient presented to the ED with complaint of dyspnea, cough.  This complaint involves an extensive number of treatment options. The  initial differential  diagnosis includes, but is not limited to, pneumonia, metabolic abnormality, dehydration, etc.  This presentation is: Acute, Self-Limited, Previously Undiagnosed, Uncertain Prognosis, Complicated, Systemic Symptoms, and Threat to Life/Bodily Function  Patient is presenting with complaint of cough, shortness of breath, malaise and fatigue.  Patient with apparent sick contact at home.  Exam is concerning for possible pneumonia given strictly infiltrates on chest x-ray, elevated lactic acid, evidence of poor glycemic control with elevated glucose and pseudohyponatremia.  Patient would benefit from antibiotics and IV fluids and observation overnight. Hospitalist service made aware of case and will evaluate for admission     Co morbidities that complicated the patient's evaluation  Hypertension, CVA, CAD   Additional history obtained:  External records from outside sources obtained and reviewed including prior ED visits and prior Inpatient records.    Lab Tests:  I ordered and personally interpreted labs.  The pertinent results include: CBC, CMP, lactic acid, CBG, COVID, flu   Imaging Studies ordered:  I ordered imaging studies including chest x-ray I independently visualized and interpreted obtained imaging which showed streaky opacities in the right upper lobe I agree with the radiologist interpretation.   Cardiac Monitoring:  The patient was maintained on a cardiac monitor.  I personally viewed and interpreted the cardiac monitor which showed an underlying rhythm of: NSR   Medicines ordered:  I ordered medication including IV fluids, antibiotics, insulin for dehydration, hyperglycemia, infection Reevaluation of the patient after these medicines showed that the patient: improved   Problem List / ED Course:  Shortness of breath, cough, elevated lactic acid, hyperglycemia, pseudohyponatremia   Reevaluation:  After the interventions noted above, I reevaluated the  patient and found that they have: improved  Disposition:  After consideration of the diagnostic results and the patients response to treatment, I feel that the patent would benefit from admission.          Final Clinical Impression(s) / ED Diagnoses Final diagnoses:  SOB (shortness of breath)  Hyperglycemia    Rx / DC Orders ED Discharge Orders     None         Valarie Merino, MD 08/24/21 2029

## 2021-08-24 NOTE — Assessment & Plan Note (Signed)
H/o Aspiration PNA in June, but todays CXR findings and HPI is more suggestive of bronchitis, probably viral given nl WBC, no fever, and contagiousness. EDP did give dose of rocephin + azithro in ED. COVID and flu neg 1. Check RVP 2. Check procalcitonin 3. Repeat CBC/BMP in AM 4. Monitor for development of SIRS 5. Decide if pt needs to continue ABx based on above results.

## 2021-08-24 NOTE — Assessment & Plan Note (Signed)
Hemiplegia and speech difficulty at baseline.

## 2021-08-25 DIAGNOSIS — R9431 Abnormal electrocardiogram [ECG] [EKG]: Secondary | ICD-10-CM | POA: Diagnosis present

## 2021-08-25 DIAGNOSIS — I69354 Hemiplegia and hemiparesis following cerebral infarction affecting left non-dominant side: Secondary | ICD-10-CM | POA: Diagnosis not present

## 2021-08-25 DIAGNOSIS — R112 Nausea with vomiting, unspecified: Secondary | ICD-10-CM | POA: Diagnosis present

## 2021-08-25 DIAGNOSIS — J209 Acute bronchitis, unspecified: Secondary | ICD-10-CM | POA: Diagnosis present

## 2021-08-25 DIAGNOSIS — E0590 Thyrotoxicosis, unspecified without thyrotoxic crisis or storm: Secondary | ICD-10-CM | POA: Diagnosis present

## 2021-08-25 DIAGNOSIS — Z91148 Patient's other noncompliance with medication regimen for other reason: Secondary | ICD-10-CM | POA: Diagnosis not present

## 2021-08-25 DIAGNOSIS — Z20822 Contact with and (suspected) exposure to covid-19: Secondary | ICD-10-CM | POA: Diagnosis present

## 2021-08-25 DIAGNOSIS — Z7984 Long term (current) use of oral hypoglycemic drugs: Secondary | ICD-10-CM | POA: Diagnosis not present

## 2021-08-25 DIAGNOSIS — I2510 Atherosclerotic heart disease of native coronary artery without angina pectoris: Secondary | ICD-10-CM | POA: Diagnosis present

## 2021-08-25 DIAGNOSIS — R739 Hyperglycemia, unspecified: Secondary | ICD-10-CM | POA: Diagnosis not present

## 2021-08-25 DIAGNOSIS — Z951 Presence of aortocoronary bypass graft: Secondary | ICD-10-CM | POA: Diagnosis not present

## 2021-08-25 DIAGNOSIS — R1310 Dysphagia, unspecified: Secondary | ICD-10-CM | POA: Diagnosis present

## 2021-08-25 DIAGNOSIS — G9341 Metabolic encephalopathy: Secondary | ICD-10-CM | POA: Diagnosis present

## 2021-08-25 DIAGNOSIS — E785 Hyperlipidemia, unspecified: Secondary | ICD-10-CM | POA: Diagnosis present

## 2021-08-25 DIAGNOSIS — E1165 Type 2 diabetes mellitus with hyperglycemia: Secondary | ICD-10-CM | POA: Diagnosis present

## 2021-08-25 DIAGNOSIS — Z794 Long term (current) use of insulin: Secondary | ICD-10-CM | POA: Diagnosis not present

## 2021-08-25 DIAGNOSIS — K219 Gastro-esophageal reflux disease without esophagitis: Secondary | ICD-10-CM | POA: Diagnosis present

## 2021-08-25 DIAGNOSIS — K5900 Constipation, unspecified: Secondary | ICD-10-CM | POA: Diagnosis not present

## 2021-08-25 DIAGNOSIS — E1169 Type 2 diabetes mellitus with other specified complication: Secondary | ICD-10-CM | POA: Diagnosis present

## 2021-08-25 DIAGNOSIS — E876 Hypokalemia: Secondary | ICD-10-CM | POA: Diagnosis not present

## 2021-08-25 DIAGNOSIS — R197 Diarrhea, unspecified: Secondary | ICD-10-CM | POA: Diagnosis not present

## 2021-08-25 DIAGNOSIS — Z7902 Long term (current) use of antithrombotics/antiplatelets: Secondary | ICD-10-CM | POA: Diagnosis not present

## 2021-08-25 DIAGNOSIS — E860 Dehydration: Secondary | ICD-10-CM | POA: Diagnosis present

## 2021-08-25 DIAGNOSIS — R000 Tachycardia, unspecified: Secondary | ICD-10-CM | POA: Diagnosis not present

## 2021-08-25 DIAGNOSIS — F32A Depression, unspecified: Secondary | ICD-10-CM | POA: Diagnosis present

## 2021-08-25 DIAGNOSIS — I10 Essential (primary) hypertension: Secondary | ICD-10-CM | POA: Diagnosis present

## 2021-08-25 LAB — CBC
HCT: 40.3 % (ref 36.0–46.0)
Hemoglobin: 13.4 g/dL (ref 12.0–15.0)
MCH: 26.2 pg (ref 26.0–34.0)
MCHC: 33.3 g/dL (ref 30.0–36.0)
MCV: 78.7 fL — ABNORMAL LOW (ref 80.0–100.0)
Platelets: 304 10*3/uL (ref 150–400)
RBC: 5.12 MIL/uL — ABNORMAL HIGH (ref 3.87–5.11)
RDW: 14.9 % (ref 11.5–15.5)
WBC: 10.1 10*3/uL (ref 4.0–10.5)
nRBC: 0 % (ref 0.0–0.2)

## 2021-08-25 LAB — RESPIRATORY PANEL BY PCR

## 2021-08-25 LAB — GLUCOSE, CAPILLARY
Glucose-Capillary: 399 mg/dL — ABNORMAL HIGH (ref 70–99)
Glucose-Capillary: 294 mg/dL — ABNORMAL HIGH (ref 70–99)
Glucose-Capillary: 218 mg/dL — ABNORMAL HIGH (ref 70–99)
Glucose-Capillary: 337 mg/dL — ABNORMAL HIGH (ref 70–99)

## 2021-08-25 LAB — BASIC METABOLIC PANEL
Anion gap: 13 (ref 5–15)
BUN: 9 mg/dL (ref 6–20)
CO2: 24 mmol/L (ref 22–32)
Calcium: 8.9 mg/dL (ref 8.9–10.3)
Chloride: 97 mmol/L — ABNORMAL LOW (ref 98–111)
Creatinine, Ser: 0.64 mg/dL (ref 0.44–1.00)
GFR, Estimated: >60 mL/min (ref >60)
Glucose, Bld: 386 mg/dL — ABNORMAL HIGH (ref 70–99)
Potassium: 3.7 mmol/L (ref 3.5–5.1)
Sodium: 134 mmol/L — ABNORMAL LOW (ref 135–145)

## 2021-08-25 LAB — TROPONIN I (HIGH SENSITIVITY)
Troponin I (High Sensitivity): 4 ng/L (ref <18)
Troponin I (High Sensitivity): 6 ng/L (ref <18)

## 2021-08-25 LAB — MAGNESIUM: Magnesium: 1.6 mg/dL — ABNORMAL LOW (ref 1.7–2.4)

## 2021-08-25 MED ORDER — LABETALOL HCL 5 MG/ML IV SOLN: 5.0000 mg | INTRAVENOUS | Status: DC | PRN

## 2021-08-25 MED ORDER — MAGNESIUM HYDROXIDE 400 MG/5ML PO SUSP
30.0000 mL | Freq: Every day | ORAL | Status: DC | PRN
  Administered 2021-08-26: 30 mL via ORAL
  Filled 2021-08-25: qty 30

## 2021-08-25 MED ORDER — IPRATROPIUM-ALBUTEROL 0.5-2.5 (3) MG/3ML IN SOLN
3.0000 mL | Freq: Two times a day (BID) | RESPIRATORY_TRACT | Status: DC
  Administered 2021-08-26 – 2021-08-30 (×9): 3 mL via RESPIRATORY_TRACT
  Filled 2021-08-25 (×9): qty 3

## 2021-08-25 MED ORDER — IPRATROPIUM-ALBUTEROL 0.5-2.5 (3) MG/3ML IN SOLN
3.0000 mL | Freq: Four times a day (QID) | RESPIRATORY_TRACT | Status: DC
  Administered 2021-08-25: 3 mL via RESPIRATORY_TRACT
  Filled 2021-08-25: qty 3

## 2021-08-25 MED ORDER — LACTATED RINGERS IV SOLN: INTRAVENOUS | Status: AC

## 2021-08-25 MED ORDER — HYDROXYZINE HCL 25 MG PO TABS
25.0000 mg | ORAL_TABLET | Freq: Every day | ORAL | Status: DC
  Administered 2021-08-25 – 2021-08-28 (×4): 25 mg via ORAL
  Filled 2021-08-25 (×4): qty 1

## 2021-08-25 MED ORDER — METOPROLOL TARTRATE 5 MG/5ML IV SOLN
5.0000 mg | INTRAVENOUS | Status: AC
Start: 2021-08-25 — End: 2021-08-25
  Administered 2021-08-25: 5 mg via INTRAVENOUS
  Filled 2021-08-25: qty 5

## 2021-08-25 MED ORDER — LORAZEPAM 2 MG/ML IJ SOLN
0.5000 mg | INTRAMUSCULAR | Status: DC | PRN
  Administered 2021-08-25 – 2021-08-29 (×3): 0.5 mg via INTRAVENOUS
  Filled 2021-08-25 (×3): qty 1

## 2021-08-25 MED ORDER — PROCHLORPERAZINE EDISYLATE 10 MG/2ML IJ SOLN
10.0000 mg | Freq: Four times a day (QID) | INTRAMUSCULAR | Status: DC | PRN
  Filled 2021-08-25: qty 2

## 2021-08-25 MED ORDER — NYSTATIN 100000 UNIT/GM EX POWD
Freq: Three times a day (TID) | CUTANEOUS | Status: DC
  Administered 2021-08-29: 1 via TOPICAL
  Filled 2021-08-25 (×2): qty 15

## 2021-08-25 MED ORDER — METOPROLOL TARTRATE 50 MG PO TABS
100.0000 mg | ORAL_TABLET | Freq: Two times a day (BID) | ORAL | Status: DC
  Administered 2021-08-25 – 2021-08-30 (×11): 100 mg via ORAL
  Filled 2021-08-25 (×11): qty 2

## 2021-08-25 MED ORDER — TOPIRAMATE 25 MG PO TABS
25.0000 mg | ORAL_TABLET | Freq: Two times a day (BID) | ORAL | Status: DC
  Administered 2021-08-25 – 2021-08-29 (×9): 25 mg via ORAL
  Filled 2021-08-25 (×10): qty 1

## 2021-08-25 MED ORDER — TAMSULOSIN HCL 0.4 MG PO CAPS
0.4000 mg | ORAL_CAPSULE | Freq: Every day | ORAL | Status: DC
  Administered 2021-08-25 – 2021-08-29 (×5): 0.4 mg via ORAL
  Filled 2021-08-25 (×5): qty 1

## 2021-08-25 MED ORDER — EZETIMIBE 10 MG PO TABS
10.0000 mg | ORAL_TABLET | Freq: Every day | ORAL | Status: DC
  Administered 2021-08-25 – 2021-08-29 (×5): 10 mg via ORAL
  Filled 2021-08-25 (×6): qty 1

## 2021-08-25 MED ORDER — POLYETHYLENE GLYCOL 3350 17 G PO PACK
17.0000 g | PACK | Freq: Two times a day (BID) | ORAL | Status: DC
  Administered 2021-08-25 – 2021-08-28 (×6): 17 g via ORAL
  Filled 2021-08-25 (×8): qty 1

## 2021-08-25 MED ORDER — BUDESONIDE 0.25 MG/2ML IN SUSP
0.2500 mg | Freq: Two times a day (BID) | RESPIRATORY_TRACT | Status: DC
  Administered 2021-08-25 – 2021-08-30 (×10): 0.25 mg via RESPIRATORY_TRACT
  Filled 2021-08-25 (×10): qty 2

## 2021-08-25 MED ORDER — MAGNESIUM SULFATE 2 GM/50ML IV SOLN
2.0000 g | Freq: Once | INTRAVENOUS | Status: AC
  Administered 2021-08-25: 2 g via INTRAVENOUS
  Filled 2021-08-25: qty 50

## 2021-08-25 MED ORDER — ONDANSETRON HCL 4 MG/2ML IJ SOLN
4.0000 mg | Freq: Once | INTRAMUSCULAR | Status: AC
  Administered 2021-08-25: 4 mg via INTRAVENOUS
  Filled 2021-08-25: qty 2

## 2021-08-25 MED ORDER — PANTOPRAZOLE SODIUM 40 MG PO TBEC
40.0000 mg | DELAYED_RELEASE_TABLET | Freq: Every day | ORAL | Status: DC
  Administered 2021-08-26 – 2021-08-30 (×5): 40 mg via ORAL
  Filled 2021-08-25 (×5): qty 1

## 2021-08-25 MED ORDER — GABAPENTIN 100 MG PO CAPS
100.0000 mg | ORAL_CAPSULE | Freq: Three times a day (TID) | ORAL | Status: DC
  Administered 2021-08-25 – 2021-08-29 (×13): 100 mg via ORAL
  Filled 2021-08-25 (×14): qty 1

## 2021-08-25 MED ORDER — METHIMAZOLE 5 MG PO TABS
5.0000 mg | ORAL_TABLET | Freq: Every day | ORAL | Status: DC
  Administered 2021-08-26 – 2021-08-29 (×4): 5 mg via ORAL
  Filled 2021-08-25 (×5): qty 1

## 2021-08-25 MED ORDER — ALUM & MAG HYDROXIDE-SIMETH 200-200-20 MG/5ML PO SUSP: 30.0000 mL | Freq: Once | ORAL | Status: DC

## 2021-08-25 MED ORDER — CALCIUM CARBONATE ANTACID 500 MG PO CHEW: 1.0000 | CHEWABLE_TABLET | Freq: Three times a day (TID) | ORAL | Status: DC | PRN

## 2021-08-25 NOTE — Progress Notes (Signed)
OT Cancellation Note  Patient Details Name: Lori [name] MRN: [id number] DOB: Sep 20, 1970   Cancelled Treatment:    Reason Eval/Treat Not Completed: Medical issues which prohibited therapy Patient with elevated HR and BP at this time. OT to continue to follow and check back as schedule will allow.  Jackelyn Poling OTR/L, [facility] Office# [phone] Pager# [phone]  08/25/2021, 2:48 PM

## 2021-08-25 NOTE — Progress Notes (Addendum)
EKG this AM showing QTc of 599.  On my review it does appear that in addition to S.Tach she has QT prolongation with P on T!  Best seen in lead V2: P wave pretty clearly superimposed on the end of the T wave.  1) DCd zofran 2) Check Mg stat 3) given degree of prolonged interval will go ahead and order 2g IV mag sulfate empirically rather than wait for lab result of Mg.  Pt with normal renal fxn, creat 0.8, very unlikely that whatever her magnesium level is that we cause severe hypermagnesemia with just 2g IV bolus x1.

## 2021-08-25 NOTE — Progress Notes (Signed)
Mg came back low at 1.6.  Pt having N/V and heartburn this AM onset last evening after getting IV ABx.  Pt states she has gotten N/V and heartburn with ABx in the past.  Having S.Tach 130s, but hypertensive with BP 137/83.  No L sided CP.  Suspect S.Tach may be due to BB withdrawal.  Looks like she may be on '100mg'$  PO metop BID at baseline which she didn't get last night.  (Med rec is still pending unfortunately).  Pt biggest complaint at the moment is that she wants something for headache.  Giving '5mg'$  IV metoprolol x1 to see how she responds. Ativan only for N/V due to QTc prolongation Try tums + milk of magnesia for GERD symptoms to see if these resolve Try tylenol first for headache Consider tordol if that fails to resolve Will check trop, but doesn't sound like shes having cardiac CP from what patient is telling me (denies L sided CP, radiation, etc), is pretty clear that this feels like acid reflux following vomiting.

## 2021-08-25 NOTE — Progress Notes (Addendum)
Turns out BP was actually 194/118 with HR 136 prior to metoprolol per RN.  Now HR 108 but BP still high.  Pt is just in BB withdrawal I think due to missed PO metoprolol last night.  Trying to resume home PO metoprolol '100mg'$  BID, first dose now.  Due to N/V, putting pt on PRN IV labetalol for the moment for both BP (likely also cause of her headache) and HR (if she cant keep PO metoprolol down).

## 2021-08-25 NOTE — Progress Notes (Signed)
PROGRESS NOTE    Lori [name]  XBM:[id number] DOB: [birth year] DOA: 08/24/2021 PCP: [name], NP    Brief Narrative:  51 year old female with a history of diabetes, previous stroke with left-sided hemiparesis and dysphagia, presents to the hospital with shortness of breath.  Chest x-ray was nonrevealing for pneumonia.  Concern that she may have a bronchitis.  She also developed significant vomiting, hypertension and tachycardia after admission.  She was treated supportively with antiemetics and restarted on her beta-blockers.   Assessment & Plan:   Principal Problem:   Acute bronchitis Active Problems:   Type 2 diabetes mellitus with hyperlipidemia (HCC)   Essential hypertension   History of cerebral infarction   Pseudohyponatremia   Prolonged QT interval   Nausea and vomiting   Acute bronchitis -Respiratory viral panel negative -Chest x-ray did not show any clear-cut pneumonia -Need to monitor for progression in case she does develop significant cough and fever since she is at risk for aspiration pneumonia -Procalcitonin negative -Treat supportively with bronchodilators  Prolonged QT interval -Magnesium was low, replaced -We will repeat EKG  Tachycardia -Thought to be related to reflex tachycardia due to missed dose of beta-blocker -Started back on metoprolol -Heart rate is better controlled  Type 2 diabetes -Continued on basal insulin -Continue sliding scale -Blood sugars are trending down  Hypertension -Resume home dose of metoprolol -Blood pressure seem to be better -If pressures continue to trend up, can consider restarting lisinopril  Nausea and vomiting -Abdomen is benign on exam -Appears to have similar episode a month ago -Treat supportively with antiemetics -If symptoms persist, can consider repeat imaging  Hyperthyroidism -Continue on methimazole  Prior history of stroke Left hemiparesis Dysphagia -Continue on Plavix and  statin -She is on a modified diet -PT/OT -Currently she lives at home and has nursing care for approximately 3 hours out of the day -May need placement   DVT prophylaxis: enoxaparin (LOVENOX) injection 40 mg Start: 08/24/21 2200  Code Status: Full code  Family Communication: No family present Disposition Plan: Status is: Inpatient Remains inpatient appropriate because: Continued management of GI symptoms, may need placement     Consultants:    Procedures:    Antimicrobials:      Subjective: She does not verbalize any answers.  She does move her head in yes and no directions.  Seems to be drowsy.  Says she was medicated recently  Objective: Vitals:   08/25/21 0211 08/25/21 0628 08/25/21 0700 08/25/21 1425  BP: 137/83 (!) 194/118  128/90  Pulse: (!) 122 (!) 136 (!) 106 (!) 109  Resp: 18   16  Temp: 98.6 F (37 C) 98.3 F (36.8 C)  97.6 F (36.4 C)  TempSrc: Oral Oral  Oral  SpO2: 96% 98%  95%  Weight:      Height:        Intake/Output Summary (Last 24 hours) at 08/25/2021 1837 Last data filed at 08/25/2021 1700 Gross per 24 hour  Intake 1427.35 ml  Output 1 ml  Net 1426.35 ml   Filed Weights   08/24/21 1345  Weight: 84.8 kg    Examination:  General exam: Appears calm and comfortable  Respiratory system: Clear to auscultation. Respiratory effort normal. Cardiovascular system: S1 & S2 heard, RRR. No JVD, murmurs, rubs, gallops or clicks. No pedal edema. Gastrointestinal system: Abdomen is nondistended, soft and nontender. No organomegaly or masses felt. Normal bowel sounds heard. Central nervous system: Sleeping on arrival, but wakes up to voice, left hemiparesis Extremities:  Symmetric 5 x 5 power. Skin: No rashes, lesions or ulcers Psychiatry: Moves her head for yes and no, does not verbalize answers    Data Reviewed: I have personally reviewed following labs and imaging studies  CBC: Recent Labs  Lab 08/24/21 1416 08/25/21 0518  WBC 8.9 10.1   NEUTROABS 6.1  --   HGB 14.0 13.4  HCT 42.6 40.3  MCV 78.3* 78.7*  PLT 366 354   Basic Metabolic Panel: Recent Labs  Lab 08/24/21 1416 08/25/21 0518  NA 130* 134*  K 4.5 3.7  CL 95* 97*  CO2 23 24  GLUCOSE 538* 386*  BUN 10 9  CREATININE 0.89 0.64  CALCIUM 9.2 8.9  MG  --  1.6*   GFR: Estimated Creatinine Clearance: 86.9 mL/min (by C-G formula based on SCr of 0.64 mg/dL). Liver Function Tests: No results for input(s): "AST", "ALT", "ALKPHOS", "BILITOT", "PROT", "ALBUMIN" in the last 168 hours. No results for input(s): "LIPASE", "AMYLASE" in the last 168 hours. No results for input(s): "AMMONIA" in the last 168 hours. Coagulation Profile: No results for input(s): "INR", "PROTIME" in the last 168 hours. Cardiac Enzymes: No results for input(s): "CKTOTAL", "CKMB", "CKMBINDEX", "TROPONINI" in the last 168 hours. BNP (last 3 results) No results for input(s): "PROBNP" in the last 8760 hours. HbA1C: No results for input(s): "HGBA1C" in the last 72 hours. CBG: Recent Labs  Lab 08/24/21 1851 08/24/21 2147 08/25/21 0815 08/25/21 1139 08/25/21 1711  GLUCAP 387* 338* 399* 294* 218*   Lipid Profile: No results for input(s): "CHOL", "HDL", "LDLCALC", "TRIG", "CHOLHDL", "LDLDIRECT" in the last 72 hours. Thyroid Function Tests: No results for input(s): "TSH", "T4TOTAL", "FREET4", "T3FREE", "THYROIDAB" in the last 72 hours. Anemia Panel: No results for input(s): "VITAMINB12", "FOLATE", "FERRITIN", "TIBC", "IRON", "RETICCTPCT" in the last 72 hours. Sepsis Labs: Recent Labs  Lab 08/24/21 1503 08/24/21 2030 08/24/21 2038  PROCALCITON  --  <0.10  --   LATICACIDVEN 2.8*  --  1.7    Recent Results (from the past 240 hour(s))  Culture, blood (routine x 2)     Status: None (Preliminary result)   Collection Time: 08/24/21  3:03 PM   Specimen: BLOOD  Result Value Ref Range Status   Specimen Description BLOOD LEFT ANTECUBITAL  Final   Special Requests   Final    BOTTLES  DRAWN AEROBIC ONLY Blood Culture results may not be optimal due to an inadequate volume of blood received in culture bottles   Culture   Final    NO GROWTH < 12 HOURS Performed at [facility] [address], [place], [place] [address]    Report Status PENDING  Incomplete  Resp Panel by RT-PCR (Flu A&B, Covid) Anterior Nasal Swab     Status: None   Collection Time: 08/24/21  3:04 PM   Specimen: Anterior Nasal Swab  Result Value Ref Range Status   SARS Coronavirus 2 by RT PCR NEGATIVE NEGATIVE Final    Comment: (NOTE) SARS-CoV-2 target nucleic acids are NOT DETECTED.  The SARS-CoV-2 RNA is generally detectable in upper respiratory specimens during the acute phase of infection. The lowest concentration of SARS-CoV-2 viral copies this assay can detect is 138 copies/mL. A negative result does not preclude SARS-Cov-2 infection and should not be used as the sole basis for treatment or other patient management decisions. A negative result may occur with  improper specimen collection/handling, submission of specimen other than nasopharyngeal swab, presence of viral mutation(s) within the areas targeted by this assay, and  inadequate number of viral copies(<138 copies/mL). A negative result must be combined with clinical observations, patient history, and epidemiological information. The expected result is Negative.  Fact Sheet for Patients:  EntrepreneurPulse.com.au  Fact Sheet for Healthcare Providers:  IncredibleEmployment.be  This test is no t yet approved or cleared by the [place] FDA and  has been authorized for detection and/or diagnosis of SARS-CoV-2 by FDA under an Emergency Use Authorization (EUA). This EUA will remain  in effect (meaning this test can be used) for the duration of the COVID-19 declaration under Section 564(b)(1) of the Act, 21 U.S.C.section 360bbb-3(b)(1), unless the authorization is terminated  or revoked sooner.        Influenza A by PCR NEGATIVE NEGATIVE Final   Influenza B by PCR NEGATIVE NEGATIVE Final    Comment: (NOTE) The Xpert Xpress SARS-CoV-2/FLU/RSV plus assay is intended as an aid in the diagnosis of influenza from Nasopharyngeal swab specimens and should not be used as a sole basis for treatment. Nasal washings and aspirates are unacceptable for Xpert Xpress SARS-CoV-2/FLU/RSV testing.  Fact Sheet for Patients: EntrepreneurPulse.com.au  Fact Sheet for Healthcare Providers: IncredibleEmployment.be  This test is not yet approved or cleared by the [place] FDA and has been authorized for detection and/or diagnosis of SARS-CoV-2 by FDA under an Emergency Use Authorization (EUA). This EUA will remain in effect (meaning this test can be used) for the duration of the COVID-19 declaration under Section 564(b)(1) of the Act, 21 U.S.C. section 360bbb-3(b)(1), unless the authorization is terminated or revoked.  Performed at [facility], [place] [address] [place], [place] [address]   Culture, blood (routine x 2)     Status: None (Preliminary result)   Collection Time: 08/24/21  3:08 PM   Specimen: BLOOD RIGHT FOREARM  Result Value Ref Range Status   Specimen Description BLOOD RIGHT FOREARM  Final   Special Requests   Final    BOTTLES DRAWN AEROBIC AND ANAEROBIC Blood Culture results may not be optimal due to an inadequate volume of blood received in culture bottles   Culture   Final    NO GROWTH < 12 HOURS Performed at [facility], [place] [address], [place], [place] [address]    Report Status PENDING  Incomplete  Respiratory (~20 pathogens) panel by PCR     Status: None   Collection Time: 08/24/21  8:47 PM   Specimen: Nasopharyngeal Swab; Respiratory  Result Value Ref Range Status   Adenovirus NOT DETECTED NOT DETECTED Final   Coronavirus 229E NOT DETECTED NOT DETECTED Final    Comment: (NOTE) The Coronavirus on  the Respiratory Panel, DOES NOT test for the novel  Coronavirus (2019 nCoV)    Coronavirus HKU1 NOT DETECTED NOT DETECTED Final   Coronavirus NL63 NOT DETECTED NOT DETECTED Final   Coronavirus OC43 NOT DETECTED NOT DETECTED Final   Metapneumovirus NOT DETECTED NOT DETECTED Final   Rhinovirus / Enterovirus NOT DETECTED NOT DETECTED Final   Influenza A NOT DETECTED NOT DETECTED Final   Influenza B NOT DETECTED NOT DETECTED Final   Parainfluenza Virus 1 NOT DETECTED NOT DETECTED Final   Parainfluenza Virus 2 NOT DETECTED NOT DETECTED Final   Parainfluenza Virus 3 NOT DETECTED NOT DETECTED Final   Parainfluenza Virus 4 NOT DETECTED NOT DETECTED Final   Respiratory Syncytial Virus NOT DETECTED NOT DETECTED Final   Bordetella pertussis NOT DETECTED NOT DETECTED Final   Bordetella Parapertussis NOT DETECTED NOT DETECTED Final   Chlamydophila pneumoniae NOT DETECTED NOT  DETECTED Final   Mycoplasma pneumoniae NOT DETECTED NOT DETECTED Final    Comment: Performed at [facility], [place] [address], [place], [place] [address]         Radiology Studies: DG Chest 2 View  Result Date: 08/24/2021 CLINICAL DATA:  shob EXAM: CHEST - 2 VIEW COMPARISON:  July 10, 2021 FINDINGS: The heart size and mediastinal contours are within normal limits. Again seen are sternotomy wires and mediastinal surgical clips. There are streaky opacities seen in the right upper lobe and left perihilar region and are more prominent in the present study. Linear scar at the right lower lung. No consolidation, pleural effusion or vascular congestion. The visualized skeletal structures are unremarkable. IMPRESSION: Streaky opacities in the right upper lobe and at the left perihilar region and are more prominent in the present study of peribronchial thickening and/or airspace disease. No consolidation. Electronically Signed   By: Frazier Richards M.D.   On: 08/24/2021 14:41        Scheduled Meds:  atorvastatin  80 mg Oral Daily    clopidogrel  75 mg Oral Daily   enoxaparin (LOVENOX) injection  40 mg Subcutaneous Q24H   insulin aspart  0-15 Units Subcutaneous TID WC   insulin aspart  0-5 Units Subcutaneous QHS   insulin glargine-yfgn  30 Units Subcutaneous QHS   LORazepam  0.5 mg Oral QHS   melatonin  3 mg Oral QHS   metoprolol tartrate  100 mg Oral BID   nystatin   Topical TID   Continuous Infusions:  lactated ringers 100 mL/hr at 08/25/21 1835     LOS: 0 days    Time spent: 54mns    JKathie Dike MD [facility]   If 7PM-7AM, please contact night-coverage [email or web address]  08/25/2021, 6:37 PM

## 2021-08-25 NOTE — Progress Notes (Signed)
PT Cancellation Note  Patient Details Name: Lori [name] MRN: [id number] DOB: 1970-06-08   Cancelled Treatment:    Reason Eval/Treat Not Completed: Patient not medically ready; pt with elevated BP and HR, missed metoprolol dose last pm. Will continue efforts to complete PT eval    [facility] 08/25/2021, 1:38 PM

## 2021-08-26 DIAGNOSIS — I10 Essential (primary) hypertension: Secondary | ICD-10-CM | POA: Diagnosis not present

## 2021-08-26 DIAGNOSIS — J209 Acute bronchitis, unspecified: Secondary | ICD-10-CM | POA: Diagnosis not present

## 2021-08-26 DIAGNOSIS — R112 Nausea with vomiting, unspecified: Secondary | ICD-10-CM | POA: Diagnosis not present

## 2021-08-26 DIAGNOSIS — R739 Hyperglycemia, unspecified: Secondary | ICD-10-CM | POA: Diagnosis not present

## 2021-08-26 LAB — GLUCOSE, CAPILLARY
Glucose-Capillary: 139 mg/dL — ABNORMAL HIGH (ref 70–99)
Glucose-Capillary: 234 mg/dL — ABNORMAL HIGH (ref 70–99)
Glucose-Capillary: 308 mg/dL — ABNORMAL HIGH (ref 70–99)
Glucose-Capillary: 275 mg/dL — ABNORMAL HIGH (ref 70–99)

## 2021-08-26 LAB — BASIC METABOLIC PANEL
Anion gap: 10 (ref 5–15)
BUN: 10 mg/dL (ref 6–20)
CO2: 27 mmol/L (ref 22–32)
Calcium: 9 mg/dL (ref 8.9–10.3)
Chloride: 103 mmol/L (ref 98–111)
Creatinine, Ser: 0.73 mg/dL (ref 0.44–1.00)
GFR, Estimated: >60 mL/min (ref >60)
Glucose, Bld: 144 mg/dL — ABNORMAL HIGH (ref 70–99)
Potassium: 3.3 mmol/L — ABNORMAL LOW (ref 3.5–5.1)
Sodium: 140 mmol/L (ref 135–145)

## 2021-08-26 LAB — CBC
HCT: 36.4 % (ref 36.0–46.0)
Hemoglobin: 12.2 g/dL (ref 12.0–15.0)
MCH: 26.3 pg (ref 26.0–34.0)
MCHC: 33.5 g/dL (ref 30.0–36.0)
MCV: 78.6 fL — ABNORMAL LOW (ref 80.0–100.0)
Platelets: 295 10*3/uL (ref 150–400)
RBC: 4.63 MIL/uL (ref 3.87–5.11)
RDW: 15.1 % (ref 11.5–15.5)
WBC: 8.7 10*3/uL (ref 4.0–10.5)
nRBC: 0 % (ref 0.0–0.2)

## 2021-08-26 LAB — MAGNESIUM: Magnesium: 1.9 mg/dL (ref 1.7–2.4)

## 2021-08-26 MED ORDER — GERHARDT'S BUTT CREAM
TOPICAL_CREAM | Freq: Four times a day (QID) | CUTANEOUS | Status: DC | PRN
Start: 2021-08-26 — End: 2021-08-30
  Filled 2021-08-26: qty 1

## 2021-08-26 MED ORDER — BISACODYL 10 MG RE SUPP
10.0000 mg | Freq: Once | RECTAL | Status: AC
Start: 2021-08-26 — End: 2021-08-26
  Administered 2021-08-26: 10 mg via RECTAL
  Filled 2021-08-26: qty 1

## 2021-08-26 MED ORDER — POTASSIUM CHLORIDE CRYS ER 20 MEQ PO TBCR
40.0000 meq | EXTENDED_RELEASE_TABLET | ORAL | Status: AC
  Administered 2021-08-26 (×2): 40 meq via ORAL
  Filled 2021-08-26 (×2): qty 2

## 2021-08-26 MED ORDER — INSULIN ASPART 100 UNIT/ML IJ SOLN
5.0000 [IU] | Freq: Three times a day (TID) | INTRAMUSCULAR | Status: DC
  Administered 2021-08-27 – 2021-08-30 (×7): 5 [IU] via SUBCUTANEOUS

## 2021-08-26 MED ORDER — POTASSIUM CHLORIDE 10 MEQ/100ML IV SOLN: 10.0000 meq | INTRAVENOUS | Status: DC

## 2021-08-26 NOTE — Evaluation (Signed)
Occupational Therapy Evaluation Patient Details Name: Lori [name] MRN: [id number] DOB: [birth year] Today's Date: 08/26/2021   History of Present Illness Patient is a 51 year old female who presented from home with shortness of breath. patient was admitted with acute bronchitis. DM, CVA with L side hemi paresis, dysphagia   Clinical Impression   Patient is a 51 year old female who was admitted for above. Patient lives at home with husband who works during the day in apartment with caregiver 3 hours a day. Patient reported being alone at home at times during the day. Patient was mod A for stand pivot transfer with increased cues for safety. Patient noted to have decreased sitting balance on EOB with leaning to L noted with increased tone and edema in LUE from previous admissions. Patient would need 24/7 caregiver support to be successful in the next level of care. Patient would continue to benefit from skilled OT services at this time while admitted and after d/c to address noted deficits in order to improve overall safety and independence in ADLs.       Recommendations for follow up therapy are one component of a multi-disciplinary discharge planning process, led by the attending physician.  Recommendations may be updated based on patient status, additional functional criteria and insurance authorization.   Follow Up Recommendations       Assistance Recommended at Discharge Frequent or constant Supervision/Assistance  Patient can return home with the following A lot of help with walking and/or transfers;A lot of help with bathing/dressing/bathroom;Assistance with cooking/housework;Direct supervision/assist for financial management;Assist for transportation;Help with stairs or ramp for entrance;Direct supervision/assist for medications management    Functional Status Assessment  Patient has had a recent decline in their functional status and demonstrates the ability to make significant  improvements in function in a reasonable and predictable amount of time.  Equipment Recommendations  None recommended by OT    Recommendations for Other Services       Precautions / Restrictions Precautions Precautions: Fall Restrictions Weight Bearing Restrictions: No      Mobility Bed Mobility Overal bed mobility: Needs Assistance Bed Mobility: Supine to Sit     Supine to sit: Max assist, HOB elevated     General bed mobility comments: with physical assist to scoot to  EOB and transition BLE and trunk into supine    Transfers                          Balance Overall balance assessment: Needs assistance Sitting-balance support: Single extremity supported Sitting balance-Leahy Scale: Poor   Postural control: Left lateral lean Standing balance support: Single extremity supported, During functional activity Standing balance-Leahy Scale: Poor                             ADL either performed or assessed with clinical judgement   ADL                                               Vision Baseline Vision/History: 1 Wears glasses Patient Visual Report: No change from baseline       Perception     Praxis      Pertinent Vitals/Pain Pain Assessment Pain Assessment: No/denies pain     Hand Dominance Right   Extremity/Trunk Assessment Upper Extremity  Assessment Upper Extremity Assessment: LUE deficits/detail LUE Deficits / Details: increased tone ith eblow in extension with increased time noted to have increased edema in L wrist with position in extension.no active ROM on this UE.   Lower Extremity Assessment Lower Extremity Assessment: Defer to PT evaluation   Cervical / Trunk Assessment Cervical / Trunk Assessment: Kyphotic   Communication Communication Communication: Expressive difficulties   Cognition Arousal/Alertness: Awake/alert Behavior During Therapy: WFL for tasks assessed/performed Overall Cognitive  Status: Difficult to assess                                 General Comments: able to make some verbalizations noted to have mumbled speech at times.was lethargic at start of session but noted to improve with increased time and activity.     General Comments       Exercises     Shoulder Instructions      Home Living   Living Arrangements: Spouse/significant other                               Additional Comments: patient reported having an aid who assists 3 hours a day.      Prior Functioning/Environment Prior Level of Function : Needs assist       Physical Assist : Mobility (physical);ADLs (physical)     Mobility Comments: only gets up with PT/OT at home. pivots only ADLs Comments: needs assistance with all ADLs        OT Problem List: Impaired UE functional use;Decreased safety awareness;Impaired balance (sitting and/or standing);Decreased activity tolerance;Decreased range of motion;Decreased strength;Decreased knowledge of use of DME or AE;Decreased knowledge of precautions;Decreased coordination;Obesity      OT Treatment/Interventions: Therapeutic exercise;Neuromuscular education;DME and/or AE instruction;Therapeutic activities;Energy conservation;Balance training;Patient/family education    OT Goals(Current goals can be found in the care plan section) Acute Rehab OT Goals Patient Stated Goal: to go to therapy OT Goal Formulation: With patient Time For Goal Achievement: 09/09/21 Potential to Achieve Goals: Fair  OT Frequency: Min 2X/week    Co-evaluation              AM-PAC OT "6 Clicks" Daily Activity     Outcome Measure Help from another person eating meals?: A Little Help from another person taking care of personal grooming?: A Lot Help from another person toileting, which includes using toliet, bedpan, or urinal?: Total Help from another person bathing (including washing, rinsing, drying)?: Total Help from another person  to put on and taking off regular upper body clothing?: Total Help from another person to put on and taking off regular lower body clothing?: Total 6 Click Score: 9   End of Session Equipment Utilized During Treatment: Gait belt Nurse Communication: Other (comment) (ok to participate in session)  Activity Tolerance: Patient tolerated treatment well Patient left: in chair;with call bell/phone within reach;with chair alarm set  OT Visit Diagnosis: Unsteadiness on feet (R26.81);Other abnormalities of gait and mobility (R26.89);Muscle weakness (generalized) (M62.81);Hemiplegia and hemiparesis Hemiplegia - Right/Left: Left                Time: 4259-5638 OT Time Calculation (min): 26 min Charges:  OT General Charges $OT Visit: 1 Visit OT Evaluation $OT Eval Moderate Complexity: 1 Mod OT Treatments $Self Care/Home Management : 8-22 mins  Jackelyn Poling OTR/L, MS Acute Rehabilitation Department Office# [phone] Pager# [phone]   Marcellina Millin 08/26/2021,  12:09 PM

## 2021-08-26 NOTE — Progress Notes (Signed)
PROGRESS NOTE    Lori [name]  ZOX:[id number] DOB: 10-22-1970 DOA: 08/24/2021 PCP: [name], NP    Brief Narrative:  51 year old female with a history of diabetes, previous stroke with left-sided hemiparesis and dysphagia, presents to the hospital with shortness of breath.  Chest x-ray was nonrevealing for pneumonia.  Concern that she may have a bronchitis.  She also developed significant vomiting, hypertension and tachycardia after admission.  She was treated supportively with antiemetics and restarted on her beta-blockers.   Assessment & Plan:   Principal Problem:   Acute bronchitis Active Problems:   Type 2 diabetes mellitus with hyperlipidemia (HCC)   Essential hypertension   History of cerebral infarction   Pseudohyponatremia   Prolonged QT interval   Nausea and vomiting   Acute bronchitis -Respiratory viral panel negative -Chest x-ray did not show any clear-cut pneumonia -Need to monitor for progression in case she does develop significant cough and fever since she is at risk for aspiration pneumonia -Procalcitonin negative -Treat supportively with bronchodilators -Clinically seems to be improving  Prolonged QT interval -Magnesium was low, replaced -We will repeat EKG  Tachycardia -Thought to be related to reflex tachycardia due to missed dose of beta-blocker -Started back on metoprolol -Heart rate is better controlled  Type 2 diabetes -Continued on basal insulin -Continue sliding scale -Blood sugars are trending down  Hypertension -Resume home dose of metoprolol -Blood pressure seem to be better -If pressures continue to trend up, can consider restarting lisinopril  Nausea and vomiting -Abdomen is benign on exam -Appears to have similar episode a month ago -Treat supportively with antiemetics -Clinically appears to be improving  Hyperthyroidism -Continue on methimazole  Constipation -On MiraLAX twice daily -She has not had a bowel  movement in several days -We will consider enema  Hypokalemia -Replace  Prior history of stroke Left hemiparesis Dysphagia -Continue on Plavix and statin -She is on a modified diet -PT/OT with recommendations for skilled nursing facility placement -Currently she lives at home and has nursing care for approximately 3 hours out of the day    DVT prophylaxis: enoxaparin (LOVENOX) injection 40 mg Start: 08/24/21 2200  Code Status: Full code  Family Communication: No family present Disposition Plan: Status is: Inpatient Remains inpatient appropriate because: Continued management of GI symptoms, may need placement     Consultants:    Procedures:    Antimicrobials:      Subjective: She says she is coughing, overall breathing is better.  Nausea and vomiting is better and she is tolerating oral intake.  Objective: Vitals:   08/26/21 0549 08/26/21 0802 08/26/21 0804 08/26/21 1318  BP: 104/64   124/90  Pulse: 76   88  Resp: 20   18  Temp: 97.8 F (36.6 C)   (!) 97.5 F (36.4 C)  TempSrc: Oral   Oral  SpO2: 97% 96% 96% 99%  Weight:      Height:        Intake/Output Summary (Last 24 hours) at 08/26/2021 1733 Last data filed at 08/26/2021 1320 Gross per 24 hour  Intake 1168.88 ml  Output 550 ml  Net 618.88 ml   Filed Weights   08/24/21 1345  Weight: 84.8 kg    Examination:  General exam: Alert, awake, oriented x 3 Respiratory system: Clear to auscultation. Respiratory effort normal. Cardiovascular system:RRR. No murmurs, rubs, gallops. Gastrointestinal system: Abdomen is nondistended, soft and nontender. No organomegaly or masses felt. Normal bowel sounds heard. Central nervous system: Alert and oriented.  Left  hemiplegia, speech is dysarthric Extremities: No C/C/E, +pedal pulses Skin: No rashes, lesions or ulcers Psychiatry: Judgement and insight appear normal. Mood & affect appropriate.    Data Reviewed: I have personally reviewed following labs and  imaging studies  CBC: Recent Labs  Lab 08/24/21 1416 08/25/21 0518 08/26/21 0616  WBC 8.9 10.1 8.7  NEUTROABS 6.1  --   --   HGB 14.0 13.4 12.2  HCT 42.6 40.3 36.4  MCV 78.3* 78.7* 78.6*  PLT 366 304 092   Basic Metabolic Panel: Recent Labs  Lab 08/24/21 1416 08/25/21 0518 08/26/21 0616  NA 130* 134* 140  K 4.5 3.7 3.3*  CL 95* 97* 103  CO2 '23 24 27  '$ GLUCOSE 538* 386* 144*  BUN '10 9 10  '$ CREATININE 0.89 0.64 0.73  CALCIUM 9.2 8.9 9.0  MG  --  1.6* 1.9   GFR: Estimated Creatinine Clearance: 86.9 mL/min (by C-G formula based on SCr of 0.73 mg/dL). Liver Function Tests: No results for input(s): "AST", "ALT", "ALKPHOS", "BILITOT", "PROT", "ALBUMIN" in the last 168 hours. No results for input(s): "LIPASE", "AMYLASE" in the last 168 hours. No results for input(s): "AMMONIA" in the last 168 hours. Coagulation Profile: No results for input(s): "INR", "PROTIME" in the last 168 hours. Cardiac Enzymes: No results for input(s): "CKTOTAL", "CKMB", "CKMBINDEX", "TROPONINI" in the last 168 hours. BNP (last 3 results) No results for input(s): "PROBNP" in the last 8760 hours. HbA1C: No results for input(s): "HGBA1C" in the last 72 hours. CBG: Recent Labs  Lab 08/25/21 1711 08/25/21 2156 08/26/21 0758 08/26/21 1201 08/26/21 1624  GLUCAP 218* 337* 139* 234* 308*   Lipid Profile: No results for input(s): "CHOL", "HDL", "LDLCALC", "TRIG", "CHOLHDL", "LDLDIRECT" in the last 72 hours. Thyroid Function Tests: No results for input(s): "TSH", "T4TOTAL", "FREET4", "T3FREE", "THYROIDAB" in the last 72 hours. Anemia Panel: No results for input(s): "VITAMINB12", "FOLATE", "FERRITIN", "TIBC", "IRON", "RETICCTPCT" in the last 72 hours. Sepsis Labs: Recent Labs  Lab 08/24/21 1503 08/24/21 2030 08/24/21 2038  PROCALCITON  --  <0.10  --   LATICACIDVEN 2.8*  --  1.7    Recent Results (from the past 240 hour(s))  Culture, blood (routine x 2)     Status: None (Preliminary result)    Collection Time: 08/24/21  3:03 PM   Specimen: BLOOD  Result Value Ref Range Status   Specimen Description BLOOD LEFT ANTECUBITAL  Final   Special Requests   Final    BOTTLES DRAWN AEROBIC ONLY Blood Culture results may not be optimal due to an inadequate volume of blood received in culture bottles   Culture   Final    NO GROWTH 2 DAYS Performed at [facility] [address], [place], [place] [address]    Report Status PENDING  Incomplete  Resp Panel by RT-PCR (Flu A&B, Covid) Anterior Nasal Swab     Status: None   Collection Time: 08/24/21  3:04 PM   Specimen: Anterior Nasal Swab  Result Value Ref Range Status   SARS Coronavirus 2 by RT PCR NEGATIVE NEGATIVE Final    Comment: (NOTE) SARS-CoV-2 target nucleic acids are NOT DETECTED.  The SARS-CoV-2 RNA is generally detectable in upper respiratory specimens during the acute phase of infection. The lowest concentration of SARS-CoV-2 viral copies this assay can detect is 138 copies/mL. A negative result does not preclude SARS-Cov-2 infection and should not be used as the sole basis for treatment or other patient management decisions. A negative result may occur with  improper specimen collection/handling, submission of specimen other than nasopharyngeal swab, presence of viral mutation(s) within the areas targeted by this assay, and inadequate number of viral copies(<138 copies/mL). A negative result must be combined with clinical observations, patient history, and epidemiological information. The expected result is Negative.  Fact Sheet for Patients:  EntrepreneurPulse.com.au  Fact Sheet for Healthcare Providers:  IncredibleEmployment.be  This test is no t yet approved or cleared by the [place] FDA and  has been authorized for detection and/or diagnosis of SARS-CoV-2 by FDA under an Emergency Use Authorization (EUA). This EUA will remain  in effect (meaning this test can be used)  for the duration of the COVID-19 declaration under Section 564(b)(1) of the Act, 21 U.S.C.section 360bbb-3(b)(1), unless the authorization is terminated  or revoked sooner.       Influenza A by PCR NEGATIVE NEGATIVE Final   Influenza B by PCR NEGATIVE NEGATIVE Final    Comment: (NOTE) The Xpert Xpress SARS-CoV-2/FLU/RSV plus assay is intended as an aid in the diagnosis of influenza from Nasopharyngeal swab specimens and should not be used as a sole basis for treatment. Nasal washings and aspirates are unacceptable for Xpert Xpress SARS-CoV-2/FLU/RSV testing.  Fact Sheet for Patients: EntrepreneurPulse.com.au  Fact Sheet for Healthcare Providers: IncredibleEmployment.be  This test is not yet approved or cleared by the [place] FDA and has been authorized for detection and/or diagnosis of SARS-CoV-2 by FDA under an Emergency Use Authorization (EUA). This EUA will remain in effect (meaning this test can be used) for the duration of the COVID-19 declaration under Section 564(b)(1) of the Act, 21 U.S.C. section 360bbb-3(b)(1), unless the authorization is terminated or revoked.  Performed at [facility], [place] [address], [place], [place] [address]   Culture, blood (routine x 2)     Status: None (Preliminary result)   Collection Time: 08/24/21  3:08 PM   Specimen: BLOOD RIGHT FOREARM  Result Value Ref Range Status   Specimen Description BLOOD RIGHT FOREARM  Final   Special Requests   Final    BOTTLES DRAWN AEROBIC AND ANAEROBIC Blood Culture results may not be optimal due to an inadequate volume of blood received in culture bottles   Culture   Final    NO GROWTH 2 DAYS Performed at [facility], [place] [address], [place], [place] [address]    Report Status PENDING  Incomplete  Respiratory (~20 pathogens) panel by PCR     Status: None   Collection Time: 08/24/21  8:47 PM   Specimen: Nasopharyngeal Swab; Respiratory   Result Value Ref Range Status   Adenovirus NOT DETECTED NOT DETECTED Final   Coronavirus 229E NOT DETECTED NOT DETECTED Final    Comment: (NOTE) The Coronavirus on the Respiratory Panel, DOES NOT test for the novel  Coronavirus (2019 nCoV)    Coronavirus HKU1 NOT DETECTED NOT DETECTED Final   Coronavirus NL63 NOT DETECTED NOT DETECTED Final   Coronavirus OC43 NOT DETECTED NOT DETECTED Final   Metapneumovirus NOT DETECTED NOT DETECTED Final   Rhinovirus / Enterovirus NOT DETECTED NOT DETECTED Final   Influenza A NOT DETECTED NOT DETECTED Final   Influenza B NOT DETECTED NOT DETECTED Final   Parainfluenza Virus 1 NOT DETECTED NOT DETECTED Final   Parainfluenza Virus 2 NOT DETECTED NOT DETECTED Final   Parainfluenza Virus 3 NOT DETECTED NOT DETECTED Final   Parainfluenza Virus 4 NOT DETECTED NOT DETECTED Final   Respiratory Syncytial Virus NOT DETECTED NOT DETECTED Final   Bordetella pertussis  NOT DETECTED NOT DETECTED Final   Bordetella Parapertussis NOT DETECTED NOT DETECTED Final   Chlamydophila pneumoniae NOT DETECTED NOT DETECTED Final   Mycoplasma pneumoniae NOT DETECTED NOT DETECTED Final    Comment: Performed at [facility], [place] [address], [place], [place] [address]         Radiology Studies: No results found.      Scheduled Meds:  atorvastatin  80 mg Oral Daily   bisacodyl  10 mg Rectal Once   budesonide (PULMICORT) nebulizer solution  0.25 mg Nebulization BID   clopidogrel  75 mg Oral Daily   enoxaparin (LOVENOX) injection  40 mg Subcutaneous Q24H   ezetimibe  10 mg Oral Daily   gabapentin  100 mg Oral TID   hydrOXYzine  25 mg Oral QHS   insulin aspart  0-15 Units Subcutaneous TID WC   insulin aspart  0-5 Units Subcutaneous QHS   [START ON 08/27/2021] insulin aspart  5 Units Subcutaneous TID WC   insulin glargine-yfgn  30 Units Subcutaneous QHS   ipratropium-albuterol  3 mL Nebulization BID   LORazepam  0.5 mg Oral QHS   melatonin  3 mg Oral QHS    methimazole  5 mg Oral Daily   metoprolol tartrate  100 mg Oral BID   nystatin   Topical TID   pantoprazole  40 mg Oral Daily   polyethylene glycol  17 g Oral BID   tamsulosin  0.4 mg Oral QHS   topiramate  25 mg Oral BID   Continuous Infusions:  lactated ringers 100 mL/hr at 08/25/21 1835   potassium chloride       LOS: 1 day    Time spent: 42mns    JKathie Dike MD [facility]   If 7PM-7AM, please contact night-coverage [email or web address]  08/26/2021, 5:33 PM

## 2021-08-26 NOTE — Evaluation (Signed)
Physical Therapy Evaluation Patient Details Name: Lori [name] MRN: [id number] DOB: [birth year] Today's Date: 08/26/2021  History of Present Illness  51 year old female who presented from home with shortness of breath, dx with acute bronchitis. PMH: DM, CVA with L side hemiparesis, dysphagia  Clinical Impression  Pt admitted with above diagnosis.  Pt requests to go to rehab/SNF. Lives with husband who  works during day in upper level apt.  Unsure if pt is at or near her baseline-?  Pt would benefit from incr assist/supervision and HHPT vs SNF   Pt currently with functional limitations due to the deficits listed below (see PT Problem List). Pt will benefit from skilled PT to increase their independence and safety with mobility to allow discharge to the venue listed below.          Recommendations for follow up therapy are one component of a multi-disciplinary discharge planning process, led by the attending physician.  Recommendations may be updated based on patient status, additional functional criteria and insurance authorization.  Follow Up Recommendations Skilled nursing-short term rehab (<3 hours/day) Can patient physically be transported by private vehicle: No    Assistance Recommended at Discharge Frequent or constant Supervision/Assistance  Patient can return home with the following  A little help with walking and/or transfers;Assistance with cooking/housework;Assist for transportation;A little help with bathing/dressing/bathroom;Help with stairs or ramp for entrance    Equipment Recommendations None recommended by PT  Recommendations for Other Services       Functional Status Assessment Patient has had a recent decline in their functional status and demonstrates the ability to make significant improvements in function in a reasonable and predictable amount of time.     Precautions / Restrictions Precautions Precautions: Fall Restrictions Weight Bearing Restrictions: No       Mobility  Bed Mobility Overal bed mobility: Needs Assistance Bed Mobility: Supine to Sit, Sit to Supine     Supine to sit: +2 for safety/equipment, Max assist Sit to supine: Mod assist   General bed mobility comments: assist to elevate trunk, with incr time pt able to complete scooting to EOB. assist to control trunk descent and incr time to lift LEs on to bed    Transfers Overall transfer level: Needs assistance Equipment used: Rolling walker (2 wheels) Transfers: Sit to/from Stand Sit to Stand: Min assist, +2 safety/equipment           General transfer comment: light assist to rise and transition to RW, assist to hook L hand over RW    Ambulation/Gait               General Gait Details: lateral steps along EOB with min assist, RW. pt fatigues quickly  knees beginning to buckle after a few steps  Stairs            Wheelchair Mobility    Modified Rankin (Stroke Patients Only)       Balance Overall balance assessment: Needs assistance Sitting-balance support: Feet supported, No upper extremity supported Sitting balance-Leahy Scale: Fair     Standing balance support: Bilateral upper extremity supported, During functional activity, Reliant on assistive device for balance Standing balance-Leahy Scale: Poor                               Pertinent Vitals/Pain Pain Assessment Pain Assessment: No/denies pain    Home Living Family/patient expects to be discharged to:: Skilled nursing facility Living Arrangements: Spouse/significant other  Additional Comments: patient reported having an aid who assists 3 hours a day and getting HHPT/OT prior to adm    Prior Function Prior Level of Function : Needs assist       Physical Assist : Mobility (physical);ADLs (physical) Mobility (physical): Bed mobility;Transfers;Gait   Mobility Comments: per pt she transfers with therapy, aide assists with household tasks, ADLs.  pt husband works days ADLs Comments: needs assistance with all ADLs. patient reported living in apartment that is up flights of stairs. patient reported that she is home alone at times during the day while husband works.     Hand Dominance   Dominant Hand: Right    Extremity/Trunk Assessment   Upper Extremity Assessment Upper Extremity Assessment: Defer to OT evaluation LUE Deficits / Details: increased tone ith eblow in extension  noted to have increased edema in L wrist with position in extension.no active ROM on this UE. patient was able to tolerate PROM to wrist to get into neutral and L elbow to tolerate flexion to about 100 degrees with increased resistance. digits noted to flex when sneezing or coughing.    Lower Extremity Assessment Lower Extremity Assessment: Generalized weakness    Cervical / Trunk Assessment Cervical / Trunk Assessment: Kyphotic  Communication   Communication: Expressive difficulties  Cognition Arousal/Alertness: Awake/alert Behavior During Therapy: WFL for tasks assessed/performed Overall Cognitive Status: Difficult to assess                                 General Comments: difficult to assess d/t dysarthria        General Comments      Exercises     Assessment/Plan    PT Assessment Patient needs continued PT services  PT Problem List Decreased strength;Decreased mobility;Decreased activity tolerance;Decreased balance;Decreased knowledge of use of DME;Impaired tone       PT Treatment Interventions DME instruction;Therapeutic exercise;Functional mobility training;Therapeutic activities;Patient/family education;Balance training    PT Goals (Current goals can be found in the Care Plan section)  Acute Rehab PT Goals Patient Stated Goal: to go to rehab PT Goal Formulation: With patient Time For Goal Achievement: 09/09/21 Potential to Achieve Goals: Fair    Frequency Min 2X/week     Co-evaluation                AM-PAC PT "6 Clicks" Mobility  Outcome Measure Help needed turning from your back to your side while in a flat bed without using bedrails?: Total Help needed moving from lying on your back to sitting on the side of a flat bed without using bedrails?: Total Help needed moving to and from a bed to a chair (including a wheelchair)?: A Lot Help needed standing up from a chair using your arms (e.g., wheelchair or bedside chair)?: A Little Help needed to walk in hospital room?: Total Help needed climbing 3-5 steps with a railing? : Total 6 Click Score: 9    End of Session Equipment Utilized During Treatment: Gait belt Activity Tolerance: Patient tolerated treatment well Patient left: with call bell/phone within reach;in bed;with bed alarm set   PT Visit Diagnosis: Other abnormalities of gait and mobility (R26.89);Difficulty in walking, not elsewhere classified (R26.2)    Time: 1412-1430 PT Time Calculation (min) (ACUTE ONLY): 18 min   Charges:   PT Evaluation $PT Eval Low Complexity: [place], PT  Acute Rehab Dept ([facility]) [phone]  [facility] Weekend Pager ([place] only)  [phone]  08/26/2021   [facility] 08/26/2021, 3:03 PM

## 2021-08-26 NOTE — Progress Notes (Signed)
Pt refusing to get PIV and take IV potassium. Pt states " it burns and I would rather have the pills."  Notified MD

## 2021-08-27 ENCOUNTER — Inpatient Hospital Stay (HOSPITAL_COMMUNITY): Payer: Medicare Other

## 2021-08-27 DIAGNOSIS — R739 Hyperglycemia, unspecified: Secondary | ICD-10-CM | POA: Diagnosis not present

## 2021-08-27 DIAGNOSIS — J209 Acute bronchitis, unspecified: Secondary | ICD-10-CM | POA: Diagnosis not present

## 2021-08-27 DIAGNOSIS — R112 Nausea with vomiting, unspecified: Secondary | ICD-10-CM | POA: Diagnosis not present

## 2021-08-27 DIAGNOSIS — I10 Essential (primary) hypertension: Secondary | ICD-10-CM | POA: Diagnosis not present

## 2021-08-27 LAB — GLUCOSE, CAPILLARY
Glucose-Capillary: 345 mg/dL — ABNORMAL HIGH (ref 70–99)
Glucose-Capillary: 243 mg/dL — ABNORMAL HIGH (ref 70–99)
Glucose-Capillary: 88 mg/dL (ref 70–99)
Glucose-Capillary: 151 mg/dL — ABNORMAL HIGH (ref 70–99)

## 2021-08-27 MED ORDER — BISACODYL 10 MG RE SUPP: 10.0000 mg | Freq: Once | RECTAL | Status: DC

## 2021-08-27 NOTE — Progress Notes (Signed)
PROGRESS NOTE    Lori [name]  QXI:[id number] DOB: 1970/11/01 DOA: 08/24/2021 PCP: [name], NP    Brief Narrative:  51 year old female with a history of diabetes, previous stroke with left-sided hemiparesis and dysphagia, presents to the hospital with shortness of breath.  Chest x-ray was nonrevealing for pneumonia.  Concern that she may have a bronchitis.  She also developed significant vomiting, hypertension and tachycardia after admission.  She was treated supportively with antiemetics and restarted on her beta-blockers.   Assessment & Plan:   Principal Problem:   Acute bronchitis Active Problems:   Type 2 diabetes mellitus with hyperlipidemia (HCC)   Essential hypertension   History of cerebral infarction   Pseudohyponatremia   Prolonged QT interval   Nausea and vomiting   Acute bronchitis -Respiratory viral panel negative -Chest x-ray did not show any clear-cut pneumonia -Need to monitor for progression in case she does develop significant cough and fever since she is at risk for aspiration pneumonia -Procalcitonin negative -Treat supportively with bronchodilators -Clinically seems to be improving  Prolonged QT interval -Magnesium was low, replaced -We will repeat EKG  Tachycardia -Thought to be related to reflex tachycardia due to missed dose of beta-blocker -Started back on metoprolol -Heart rate is better controlled  Type 2 diabetes -Continued on basal insulin -Continue sliding scale -Blood sugars are trending down  Hypertension -Resume home dose of metoprolol -Blood pressure seem to be better -If pressures continue to trend up, can consider restarting lisinopril  Nausea and vomiting -Abdomen is benign on exam -Appears to have similar episode a month ago -Treat supportively with antiemetics -check abd xray due to on going symptoms  Hyperthyroidism -Continue on methimazole  Constipation -On MiraLAX twice daily -She has not had a  bowel movement in several days -had a BM after suppository 7/23 -another suppository ordered today  Hypokalemia -Replace  Prior history of stroke Left hemiparesis Dysphagia -Continue on Plavix and statin -She is on a modified diet -PT/OT with recommendations for skilled nursing facility placement -Currently she lives at home and has nursing care for approximately 3 hours out of the day    DVT prophylaxis: enoxaparin (LOVENOX) injection 40 mg Start: 08/24/21 2200  Code Status: Full code  Family Communication: No family present Disposition Plan: Status is: Inpatient Remains inpatient appropriate because: Continued management of GI symptoms, may need placement     Consultants:    Procedures:    Antimicrobials:      Subjective: She says she is having difficulty eating today. She had vomiting overnight. She has nausea today and has difficulty eating. Says she had a large BM after suppository yesterday, but still feels like she has to have a BM  Objective: Vitals:   08/26/21 2101 08/27/21 0534 08/27/21 0642 08/27/21 0643  BP: 109/81 112/82    Pulse: 100 78    Resp: 18 16    Temp: 98.4 F (36.9 C) 98.8 F (37.1 C)    TempSrc: Oral Oral    SpO2: 94% 97% 100% 100%  Weight:      Height:        Intake/Output Summary (Last 24 hours) at 08/27/2021 1237 Last data filed at 08/27/2021 1224 Gross per 24 hour  Intake 500 ml  Output 500 ml  Net 0 ml   Filed Weights   08/24/21 1345  Weight: 84.8 kg    Examination:  General exam: Alert, awake, oriented x 3 Respiratory system: Clear to auscultation. Respiratory effort normal. Cardiovascular system:RRR. No murmurs, rubs,  gallops. Gastrointestinal system: Abdomen is nondistended, soft and nontender. No organomegaly or masses felt. Normal bowel sounds heard. Central nervous system: Alert and oriented.  Left hemiplegia, speech is dysarthric Extremities: No C/C/E, +pedal pulses Skin: No rashes, lesions or  ulcers Psychiatry: Judgement and insight appear normal. Mood & affect appropriate.    Data Reviewed: I have personally reviewed following labs and imaging studies  CBC: Recent Labs  Lab 08/24/21 1416 08/25/21 0518 08/26/21 0616  WBC 8.9 10.1 8.7  NEUTROABS 6.1  --   --   HGB 14.0 13.4 12.2  HCT 42.6 40.3 36.4  MCV 78.3* 78.7* 78.6*  PLT 366 304 962   Basic Metabolic Panel: Recent Labs  Lab 08/24/21 1416 08/25/21 0518 08/26/21 0616  NA 130* 134* 140  K 4.5 3.7 3.3*  CL 95* 97* 103  CO2 '23 24 27  '$ GLUCOSE 538* 386* 144*  BUN '10 9 10  '$ CREATININE 0.89 0.64 0.73  CALCIUM 9.2 8.9 9.0  MG  --  1.6* 1.9   GFR: Estimated Creatinine Clearance: 86.9 mL/min (by C-G formula based on SCr of 0.73 mg/dL). Liver Function Tests: No results for input(s): "AST", "ALT", "ALKPHOS", "BILITOT", "PROT", "ALBUMIN" in the last 168 hours. No results for input(s): "LIPASE", "AMYLASE" in the last 168 hours. No results for input(s): "AMMONIA" in the last 168 hours. Coagulation Profile: No results for input(s): "INR", "PROTIME" in the last 168 hours. Cardiac Enzymes: No results for input(s): "CKTOTAL", "CKMB", "CKMBINDEX", "TROPONINI" in the last 168 hours. BNP (last 3 results) No results for input(s): "PROBNP" in the last 8760 hours. HbA1C: No results for input(s): "HGBA1C" in the last 72 hours. CBG: Recent Labs  Lab 08/26/21 1201 08/26/21 1624 08/26/21 2103 08/27/21 0739 08/27/21 1114  GLUCAP 234* 308* 275* 345* 243*   Lipid Profile: No results for input(s): "CHOL", "HDL", "LDLCALC", "TRIG", "CHOLHDL", "LDLDIRECT" in the last 72 hours. Thyroid Function Tests: No results for input(s): "TSH", "T4TOTAL", "FREET4", "T3FREE", "THYROIDAB" in the last 72 hours. Anemia Panel: No results for input(s): "VITAMINB12", "FOLATE", "FERRITIN", "TIBC", "IRON", "RETICCTPCT" in the last 72 hours. Sepsis Labs: Recent Labs  Lab 08/24/21 1503 08/24/21 2030 08/24/21 2038  PROCALCITON  --  <0.10  --    LATICACIDVEN 2.8*  --  1.7    Recent Results (from the past 240 hour(s))  Culture, blood (routine x 2)     Status: None (Preliminary result)   Collection Time: 08/24/21  3:03 PM   Specimen: BLOOD  Result Value Ref Range Status   Specimen Description BLOOD LEFT ANTECUBITAL  Final   Special Requests   Final    BOTTLES DRAWN AEROBIC ONLY Blood Culture results may not be optimal due to an inadequate volume of blood received in culture bottles   Culture   Final    NO GROWTH 3 DAYS Performed at [facility], [place] [address], [place], [place] [address]    Report Status PENDING  Incomplete  Resp Panel by RT-PCR (Flu A&B, Covid) Anterior Nasal Swab     Status: None   Collection Time: 08/24/21  3:04 PM   Specimen: Anterior Nasal Swab  Result Value Ref Range Status   SARS Coronavirus 2 by RT PCR NEGATIVE NEGATIVE Final    Comment: (NOTE) SARS-CoV-2 target nucleic acids are NOT DETECTED.  The SARS-CoV-2 RNA is generally detectable in upper respiratory specimens during the acute phase of infection. The lowest concentration of SARS-CoV-2 viral copies this assay can detect is 138 copies/mL. A negative result does not  preclude SARS-Cov-2 infection and should not be used as the sole basis for treatment or other patient management decisions. A negative result may occur with  improper specimen collection/handling, submission of specimen other than nasopharyngeal swab, presence of viral mutation(s) within the areas targeted by this assay, and inadequate number of viral copies(<138 copies/mL). A negative result must be combined with clinical observations, patient history, and epidemiological information. The expected result is Negative.  Fact Sheet for Patients:  EntrepreneurPulse.com.au  Fact Sheet for Healthcare Providers:  IncredibleEmployment.be  This test is no t yet approved or cleared by the [place] FDA and  has been authorized for  detection and/or diagnosis of SARS-CoV-2 by FDA under an Emergency Use Authorization (EUA). This EUA will remain  in effect (meaning this test can be used) for the duration of the COVID-19 declaration under Section 564(b)(1) of the Act, 21 U.S.C.section 360bbb-3(b)(1), unless the authorization is terminated  or revoked sooner.       Influenza A by PCR NEGATIVE NEGATIVE Final   Influenza B by PCR NEGATIVE NEGATIVE Final    Comment: (NOTE) The Xpert Xpress SARS-CoV-2/FLU/RSV plus assay is intended as an aid in the diagnosis of influenza from Nasopharyngeal swab specimens and should not be used as a sole basis for treatment. Nasal washings and aspirates are unacceptable for Xpert Xpress SARS-CoV-2/FLU/RSV testing.  Fact Sheet for Patients: EntrepreneurPulse.com.au  Fact Sheet for Healthcare Providers: IncredibleEmployment.be  This test is not yet approved or cleared by the [place] FDA and has been authorized for detection and/or diagnosis of SARS-CoV-2 by FDA under an Emergency Use Authorization (EUA). This EUA will remain in effect (meaning this test can be used) for the duration of the COVID-19 declaration under Section 564(b)(1) of the Act, 21 U.S.C. section 360bbb-3(b)(1), unless the authorization is terminated or revoked.  Performed at [facility], [place] [address], [place], [place] [address]   Culture, blood (routine x 2)     Status: None (Preliminary result)   Collection Time: 08/24/21  3:08 PM   Specimen: BLOOD RIGHT FOREARM  Result Value Ref Range Status   Specimen Description BLOOD RIGHT FOREARM  Final   Special Requests   Final    BOTTLES DRAWN AEROBIC AND ANAEROBIC Blood Culture results may not be optimal due to an inadequate volume of blood received in culture bottles   Culture   Final    NO GROWTH 3 DAYS Performed at [facility], [place] [address] [place], [place] [address]    Report Status  PENDING  Incomplete  Respiratory (~20 pathogens) panel by PCR     Status: None   Collection Time: 08/24/21  8:47 PM   Specimen: Nasopharyngeal Swab; Respiratory  Result Value Ref Range Status   Adenovirus NOT DETECTED NOT DETECTED Final   Coronavirus 229E NOT DETECTED NOT DETECTED Final    Comment: (NOTE) The Coronavirus on the Respiratory Panel, DOES NOT test for the novel  Coronavirus (2019 nCoV)    Coronavirus HKU1 NOT DETECTED NOT DETECTED Final   Coronavirus NL63 NOT DETECTED NOT DETECTED Final   Coronavirus OC43 NOT DETECTED NOT DETECTED Final   Metapneumovirus NOT DETECTED NOT DETECTED Final   Rhinovirus / Enterovirus NOT DETECTED NOT DETECTED Final   Influenza A NOT DETECTED NOT DETECTED Final   Influenza B NOT DETECTED NOT DETECTED Final   Parainfluenza Virus 1 NOT DETECTED NOT DETECTED Final   Parainfluenza Virus 2 NOT DETECTED NOT DETECTED Final   Parainfluenza Virus 3 NOT DETECTED NOT  DETECTED Final   Parainfluenza Virus 4 NOT DETECTED NOT DETECTED Final   Respiratory Syncytial Virus NOT DETECTED NOT DETECTED Final   Bordetella pertussis NOT DETECTED NOT DETECTED Final   Bordetella Parapertussis NOT DETECTED NOT DETECTED Final   Chlamydophila pneumoniae NOT DETECTED NOT DETECTED Final   Mycoplasma pneumoniae NOT DETECTED NOT DETECTED Final    Comment: Performed at [facility], [place] [address] [place], [place] [address]         Radiology Studies: No results found.      Scheduled Meds:  atorvastatin  80 mg Oral Daily   bisacodyl  10 mg Rectal Once   budesonide (PULMICORT) nebulizer solution  0.25 mg Nebulization BID   clopidogrel  75 mg Oral Daily   enoxaparin (LOVENOX) injection  40 mg Subcutaneous Q24H   ezetimibe  10 mg Oral Daily   gabapentin  100 mg Oral TID   hydrOXYzine  25 mg Oral QHS   insulin aspart  0-15 Units Subcutaneous TID WC   insulin aspart  0-5 Units Subcutaneous QHS   insulin aspart  5 Units Subcutaneous TID WC   insulin  glargine-yfgn  30 Units Subcutaneous QHS   ipratropium-albuterol  3 mL Nebulization BID   LORazepam  0.5 mg Oral QHS   melatonin  3 mg Oral QHS   methimazole  5 mg Oral Daily   metoprolol tartrate  100 mg Oral BID   nystatin   Topical TID   pantoprazole  40 mg Oral Daily   polyethylene glycol  17 g Oral BID   tamsulosin  0.4 mg Oral QHS   topiramate  25 mg Oral BID   Continuous Infusions:     LOS: 2 days    Time spent: 92mns    JKathie Dike MD [facility]   If 7PM-7AM, please contact night-coverage [email or web address]  08/27/2021, 12:37 PM

## 2021-08-28 ENCOUNTER — Inpatient Hospital Stay (HOSPITAL_COMMUNITY): Payer: Medicare Other

## 2021-08-28 DIAGNOSIS — R739 Hyperglycemia, unspecified: Secondary | ICD-10-CM | POA: Diagnosis not present

## 2021-08-28 DIAGNOSIS — J209 Acute bronchitis, unspecified: Secondary | ICD-10-CM | POA: Diagnosis not present

## 2021-08-28 DIAGNOSIS — I10 Essential (primary) hypertension: Secondary | ICD-10-CM | POA: Diagnosis not present

## 2021-08-28 DIAGNOSIS — R112 Nausea with vomiting, unspecified: Secondary | ICD-10-CM | POA: Diagnosis not present

## 2021-08-28 LAB — BASIC METABOLIC PANEL
Anion gap: 8 (ref 5–15)
BUN: 12 mg/dL (ref 6–20)
CO2: 22 mmol/L (ref 22–32)
Calcium: 9 mg/dL (ref 8.9–10.3)
Chloride: 106 mmol/L (ref 98–111)
Creatinine, Ser: 0.69 mg/dL (ref 0.44–1.00)
GFR, Estimated: >60 mL/min (ref >60)
Glucose, Bld: 228 mg/dL — ABNORMAL HIGH (ref 70–99)
Potassium: 4.7 mmol/L (ref 3.5–5.1)
Sodium: 136 mmol/L (ref 135–145)

## 2021-08-28 LAB — GLUCOSE, CAPILLARY
Glucose-Capillary: 211 mg/dL — ABNORMAL HIGH (ref 70–99)
Glucose-Capillary: 237 mg/dL — ABNORMAL HIGH (ref 70–99)
Glucose-Capillary: 276 mg/dL — ABNORMAL HIGH (ref 70–99)
Glucose-Capillary: 277 mg/dL — ABNORMAL HIGH (ref 70–99)
Glucose-Capillary: 156 mg/dL — ABNORMAL HIGH (ref 70–99)

## 2021-08-28 LAB — MAGNESIUM: Magnesium: 1.9 mg/dL (ref 1.7–2.4)

## 2021-08-28 MED ORDER — DIATRIZOATE MEGLUMINE & SODIUM 66-10 % PO SOLN
90.0000 mL | Freq: Once | ORAL | Status: AC
  Administered 2021-08-28: 90 mL via NASOGASTRIC
  Filled 2021-08-28: qty 90

## 2021-08-28 MED ORDER — BISACODYL 10 MG RE SUPP
10.0000 mg | Freq: Once | RECTAL | Status: AC
  Administered 2021-08-28: 10 mg via RECTAL
  Filled 2021-08-28: qty 1

## 2021-08-28 NOTE — Progress Notes (Signed)
Physical Therapy Treatment Patient Details Name: Lori [name] MRN: [id number] DOB: [birth year] Today's Date: 08/28/2021   History of Present Illness 51 year old female who presented from home with shortness of breath, dx with acute bronchitis. PMH: DM, CVA with L side hemiparesis, dysphagia    PT Comments    Progressing with mobility. Overall, Min A on today for mobility. She was able to ambulate ~15 feet with a RW. Pt tolerated activity well. Unsure of d/c plan at this time-recommend TOC.    Recommendations for follow up therapy are one component of a multi-disciplinary discharge planning process, led by the attending physician.  Recommendations may be updated based on patient status, additional functional criteria and insurance authorization.  Follow Up Recommendations  Skilled nursing-short term rehab (<3 hours/day) vs HHPT-depending on pt/family decision and options available to patient. She may be near her basline.) Can patient physically be transported by private vehicle: Yes   Assistance Recommended at Discharge Frequent or constant Supervision/Assistance  Patient can return home with the following A little help with walking and/or transfers;Assistance with cooking/housework;Assist for transportation;A little help with bathing/dressing/bathroom;Help with stairs or ramp for entrance   Equipment Recommendations  None recommended by PT    Recommendations for Other Services       Precautions / Restrictions Precautions Precautions: Fall Restrictions Weight Bearing Restrictions: No     Mobility  Bed Mobility Overal bed mobility: Needs Assistance Bed Mobility: Supine to Sit     Supine to sit: Min guard, HOB elevated     General bed mobility comments: Cues required. Increased time. No physical assistance required on today.    Transfers Overall transfer level: Needs assistance Equipment used: Rolling walker (2 wheels) Transfers: Sit to/from Stand Sit to Stand: Min  assist           General transfer comment: Light assist to rise. Cues for safety.    Ambulation/Gait Ambulation/Gait assistance: Min assist, +2 safety/equipment Gait Distance (Feet): 15 Feet Assistive device: Rolling walker (2 wheels) Gait Pattern/deviations: Step-to pattern, Narrow base of support       General Gait Details: Assist to steady throughout distance for safety even though pt stated "you don't have to hold me.Marland KitchenMarland KitchenI got it". Followed with recliner and used it to transport pt back to room   Stairs             Wheelchair Mobility    Modified Rankin (Stroke Patients Only)       Balance Overall balance assessment: Needs assistance   Sitting balance-Leahy Scale: Fair     Standing balance support: Bilateral upper extremity supported, During functional activity, Reliant on assistive device for balance Standing balance-Leahy Scale: Poor                              Cognition Arousal/Alertness: Awake/alert Behavior During Therapy: WFL for tasks assessed/performed Overall Cognitive Status: Within Functional Limits for tasks assessed                                          Exercises      General Comments        Pertinent Vitals/Pain Pain Assessment Pain Assessment: No/denies pain    Home Living  Prior Function            PT Goals (current goals can now be found in the care plan section) Progress towards PT goals: Progressing toward goals    Frequency    Min 2X/week      PT Plan Current plan remains appropriate    Co-evaluation              AM-PAC PT "6 Clicks" Mobility   Outcome Measure  Help needed turning from your back to your side while in a flat bed without using bedrails?: None Help needed moving from lying on your back to sitting on the side of a flat bed without using bedrails?: None Help needed moving to and from a bed to a chair (including a  wheelchair)?: A Little Help needed standing up from a chair using your arms (e.g., wheelchair or bedside chair)?: A Little Help needed to walk in hospital room?: A Little Help needed climbing 3-5 steps with a railing? : Total 6 Click Score: 18    End of Session Equipment Utilized During Treatment: Gait belt Activity Tolerance: Patient tolerated treatment well Patient left: in chair;with call bell/phone within reach;with nursing/sitter in room (NT in room)   PT Visit Diagnosis: Other abnormalities of gait and mobility (R26.89);Difficulty in walking, not elsewhere classified (R26.2);Other symptoms and signs involving the nervous system (Q33.007)     Time: 6226-3335 PT Time Calculation (min) (ACUTE ONLY): 15 min  Charges:  $Gait Training: 8-22 mins                         Doreatha Massed, PT Acute Rehabilitation  Office: [phone] Pager: [phone]

## 2021-08-28 NOTE — NC FL2 (Signed)
[place] LEVEL OF CARE SCREENING TOOL     IDENTIFICATION  Patient Name: Lori [name] Birthdate: 1970-11-08 Sex: female Admission Date (Current Location): 08/24/2021  [place] and [place] Number:  Kathleen Argue [id number] [place] and Address:  [facility],  [place], [place]      Provider Number: [id number]  Attending Physician Name and Address:  Kathie Dike, MD  Relative Name and Phone Number:  [name],[name] Daughter [phone]  [phone]  Kyla Balzarine Spouse   [phone]    Current Level of Care: Hospital Recommended Level of Care: [place] Prior Approval Number:    Date Approved/Denied:   PASRR Number: [id number]  Discharge Plan: SNF    Current Diagnoses: Patient Active Problem List   Diagnosis Date Noted   Prolonged QT interval 08/25/2021   Nausea and vomiting 08/25/2021   Acute bronchitis 08/24/2021   Hyperosmolar hyperglycemic state (HHS) ([place]) 07/10/2021   Pseudohyponatremia 07/10/2021   Thyroid disease 07/10/2021   Functional urinary incontinence 05/17/2021   MDD (major depressive disorder), recurrent episode, severe ([place]) 04/09/2021   Diabetic gastroparesis ([place]) 03/16/2021   Migraines 03/16/2021   History of cerebral infarction    Debility 02/26/2021   DKA (diabetic ketoacidosis) ([place]) 02/19/2021   Constipation    Weakness    Type 2 diabetes mellitus with hyperlipidemia ([place])    Aphasia    Spell of abnormal behavior    Stroke ([place]) 01/19/2021   Tachycardia 01/19/2021   CAD (coronary artery disease) 01/19/2021   CVA (cerebral vascular accident) ([place]) 01/19/2021   Diabetes ([place])    Hyperthyroidism    Essential hypertension    Pheochromocytoma    Anxiety and depression    HLD (hyperlipidemia)     Orientation RESPIRATION BLADDER Height & Weight     Self, Time, Place, Situation  Normal Incontinent Weight: 187 lb (84.8 kg) Height:  '5\' 2"'$  (157.5 cm)  BEHAVIORAL  SYMPTOMS/MOOD NEUROLOGICAL BOWEL NUTRITION STATUS      Continent Diet (Regular diet)  AMBULATORY STATUS COMMUNICATION OF NEEDS Skin   Limited Assist Verbally Normal                       Personal Care Assistance Level of Assistance  Bathing, Feeding, Dressing Bathing Assistance: Limited assistance Feeding assistance: Independent Dressing Assistance: Limited assistance     Functional Limitations Info  Sight, Hearing, Speech Sight Info: Adequate Hearing Info: Adequate Speech Info: Adequate    SPECIAL CARE FACTORS FREQUENCY  PT (By licensed PT), OT (By licensed OT)     PT Frequency: Minimum 5x a week OT Frequency: Minimum 5x a week            Contractures Contractures Info: Not present    Additional Factors Info  Code Status, Allergies, Insulin Sliding Scale, Psychotropic Code Status Info: Full Code Allergies Info: Sumatriptan   Contrast Media (Iodinated Contrast Media)   Penicillins Psychotropic Info: LORazepam (ATIVAN) tablet 0.5 mg Insulin Sliding Scale Info: insulin aspart (novoLOG) injection 0-15 Units 3x a day with meals       Current Medications (08/28/2021):  This is the current hospital active medication list Current Facility-Administered Medications  Medication Dose Route Frequency Provider Last Rate Last Admin   acetaminophen (TYLENOL) tablet 650 mg  650 mg Oral Q6H PRN Etta Quill, DO   650 mg at 08/26/21 1334   Or   acetaminophen (TYLENOL) suppository 650 mg  650 mg Rectal Q6H PRN Etta Quill, DO  atorvastatin (LIPITOR) tablet 80 mg  80 mg Oral Daily Jennette Kettle M, DO   80 mg at 08/28/21 0836   budesonide (PULMICORT) nebulizer solution 0.25 mg  0.25 mg Nebulization BID Kathie Dike, MD   0.25 mg at 08/28/21 2505   calcium carbonate (TUMS - dosed in mg elemental calcium) chewable tablet 200 mg of elemental calcium  1 tablet Oral TID WC PRN Elgergawy, Silver Huguenin, MD       clopidogrel (PLAVIX) tablet 75 mg  75 mg Oral Daily Jennette Kettle M, DO   75 mg at 08/28/21 0836   enoxaparin (LOVENOX) injection 40 mg  40 mg Subcutaneous Q24H Jennette Kettle M, DO   40 mg at 08/25/21 2227   ezetimibe (ZETIA) tablet 10 mg  10 mg Oral Daily Kathie Dike, MD   10 mg at 08/28/21 0836   gabapentin (NEURONTIN) capsule 100 mg  100 mg Oral TID Kathie Dike, MD   100 mg at 08/28/21 1633   Gerhardt's butt cream   Topical QID PRN Kathie Dike, MD       hydrOXYzine (ATARAX) tablet 25 mg  25 mg Oral QHS Kathie Dike, MD   25 mg at 08/27/21 2205   insulin aspart (novoLOG) injection 0-15 Units  0-15 Units Subcutaneous TID WC Etta Quill, DO   8 Units at 08/28/21 1719   insulin aspart (novoLOG) injection 0-5 Units  0-5 Units Subcutaneous QHS Etta Quill, DO   3 Units at 08/26/21 2219   insulin aspart (novoLOG) injection 5 Units  5 Units Subcutaneous TID WC Kathie Dike, MD   5 Units at 08/28/21 1719   insulin glargine-yfgn (SEMGLEE) injection 30 Units  30 Units Subcutaneous QHS Etta Quill, DO   30 Units at 08/27/21 2207   ipratropium-albuterol (DUONEB) 0.5-2.5 (3) MG/3ML nebulizer solution 3 mL  3 mL Nebulization BID Kathie Dike, MD   3 mL at 08/28/21 0910   labetalol (NORMODYNE) injection 5-10 mg  5-10 mg Intravenous Q2H PRN Etta Quill, DO       LORazepam (ATIVAN) injection 0.5 mg  0.5 mg Intravenous Q4H PRN Etta Quill, DO   0.5 mg at 08/25/21 1312   LORazepam (ATIVAN) tablet 0.5 mg  0.5 mg Oral QHS Jennette Kettle M, DO   0.5 mg at 08/27/21 2204   magnesium hydroxide (MILK OF MAGNESIA) suspension 30 mL  30 mL Oral Daily PRN Elgergawy, Silver Huguenin, MD   30 mL at 08/26/21 1716   melatonin tablet 3 mg  3 mg Oral QHS Jennette Kettle M, DO   3 mg at 08/27/21 2205   methimazole (TAPAZOLE) tablet 5 mg  5 mg Oral Daily Kathie Dike, MD   5 mg at 08/28/21 0836   metoprolol tartrate (LOPRESSOR) tablet 100 mg  100 mg Oral BID Elgergawy, Silver Huguenin, MD   100 mg at 08/28/21 3976   nystatin (MYCOSTATIN/NYSTOP) topical  powder   Topical TID Elgergawy, Silver Huguenin, MD   Given at 08/28/21 1634   pantoprazole (PROTONIX) EC tablet 40 mg  40 mg Oral Daily Kathie Dike, MD   40 mg at 08/28/21 0836   polyethylene glycol (MIRALAX / GLYCOLAX) packet 17 g  17 g Oral BID Kathie Dike, MD   17 g at 08/28/21 0835   prochlorperazine (COMPAZINE) injection 10 mg  10 mg Intravenous Q6H PRN Kathie Dike, MD       tamsulosin (FLOMAX) capsule 0.4 mg  0.4 mg Oral QHS Kathie Dike, MD   0.4  mg at 08/27/21 2205   topiramate (TOPAMAX) tablet 25 mg  25 mg Oral BID Kathie Dike, MD   25 mg at 08/28/21 2947     Discharge Medications: Please see discharge summary for a list of discharge medications.  Relevant Imaging Results:  Relevant Lab Results:   Additional Information SSN [id number]  Ross Ludwig, LCSW

## 2021-08-28 NOTE — TOC Progression Note (Addendum)
Transition of Care [facility]) - Progression Note    Patient Details  Name: Lori [name] MRN: [id number] Date of Birth: April 01, 1970  Transition of Care [facility]) CM/SW Contact  Ross Ludwig, [place] Phone Number: 08/28/2021, 4:15 PM  Clinical Narrative:     CSW spoke to patient and she is agreeable to going to SNF for rehab.  She also informed this CSW that she is agreeable to signing over her disability check and to stay at a SNF for 30 days.  CSW explained that because she only has Medicaid, it will limit her options for facilities.  Patient expressed understanding, and stated that her kids live in [place] and she lives in [place].  CSW asked if it is okay to send to other counties as well and she said yes.  CSW to begin bed search in [place] and surrounding counties.  Patient is currently being seen by [facility] PT and OT.       Expected Discharge Plan and Services  SNF verse home health.                                               Social Determinants of Health (SDOH) Interventions    Readmission Risk Interventions    07/18/2021   12:43 PM 07/11/2021    1:22 PM 02/22/2021   12:36 PM  Readmission Risk Prevention Plan  Transportation Screening Complete Complete Complete  PCP or Specialist Appt within 3-5 Days   Complete  HRI or [place]   Complete  Social Work Consult for [place] Planning/Counseling   Complete  Palliative Care Screening   Complete  Medication Review Press photographer) Complete Complete Complete  PCP or Specialist appointment within 3-5 days of discharge Complete    HRI or Home Care Consult Complete Complete   SW Recovery Care/Counseling Consult Complete Complete   Palliative Care Screening Not Applicable Not Applicable   Skilled Nursing Facility Complete Complete

## 2021-08-28 NOTE — Progress Notes (Addendum)
PROGRESS NOTE    Lori [name]  NWG:[id number] DOB: 30-Oct-1970 DOA: 08/24/2021 PCP: [name], NP    Brief Narrative:  51 year old female with a history of diabetes, previous stroke with left-sided hemiparesis and dysphagia, presents to the hospital with shortness of breath.  Chest x-ray was unrevealing for pneumonia.  Concern that she may have a bronchitis.  She also complains of abdominal pain and vomiting. KUB unrevealing. Possibly related to constipation, further work up underway. May need placement at discharge.   Assessment & Plan:   Principal Problem:   Acute bronchitis Active Problems:   Type 2 diabetes mellitus with hyperlipidemia (HCC)   Essential hypertension   History of cerebral infarction   Pseudohyponatremia   Prolonged QT interval   Nausea and vomiting   Acute bronchitis -Respiratory viral panel negative -Chest x-ray did not show any clear-cut pneumonia -Need to monitor for progression in case she does develop significant cough and fever since she is at risk for aspiration pneumonia -Procalcitonin negative -Treat supportively with bronchodilators -Clinically seems to be improving -currently on room air  Prolonged QT interval -Magnesium was low, replaced -We will repeat EKG  Type 2 diabetes -Continued on basal insulin -Continue sliding scale -Blood sugars are trending down  Hypertension -Continue home dose of metoprolol -Blood pressure seem to be better -If pressures continue to trend up, can consider restarting lisinopril  Hyperthyroidism -Continue on methimazole  Constipation Nausea/Vomitting Abdominal pain -On MiraLAX twice daily -continues to have abdominal pain and nausea -will give another suppository today -if symptoms do not improve with BM or she does not have significant results with suppository, would consider SBO protocol with gastrograffin  Hypokalemia -Replace  Prior history of stroke Left  hemiparesis Dysphagia -Continue on Plavix and statin -She is on a modified diet -PT/OT with recommendations for skilled nursing facility placement -Currently she lives at home and has nursing care for approximately 3 hours out of the day    DVT prophylaxis: enoxaparin (LOVENOX) injection 40 mg Start: 08/24/21 2200  Code Status: Full code  Family Communication: No family present Disposition Plan: Status is: Inpatient Remains inpatient appropriate because: Continued management of GI symptoms, may need placement     Consultants:    Procedures:    Antimicrobials:      Subjective: She reports on going abdominal pain and nausea. No BM yesterday. She did not receive suppository yesterday.   Objective: Vitals:   08/27/21 1440 08/27/21 2047 08/27/21 2200 08/28/21 0700  BP: 113/82  (!) 126/91 122/88  Pulse: 80  82 80  Resp: '16  16 16  '$ Temp:   98.2 F (36.8 C) 98 F (36.7 C)  TempSrc:   Oral Oral  SpO2: 98% 98% 99% 99%  Weight:      Height:        Intake/Output Summary (Last 24 hours) at 08/28/2021 1100 Last data filed at 08/28/2021 0831 Gross per 24 hour  Intake 20 ml  Output 450 ml  Net -430 ml   Filed Weights   08/24/21 1345  Weight: 84.8 kg    Examination:  General exam: Alert, awake, oriented x 3 Respiratory system: Clear to auscultation. Respiratory effort normal. Cardiovascular system:RRR. No murmurs, rubs, gallops. Gastrointestinal system: Abdomen is nondistended, soft and tender in lower abdomen. No organomegaly or masses felt. Bowel sounds sluggish Central nervous system: Alert and oriented.  Left hemiplegia, speech is dysarthric Extremities: No C/C/E, +pedal pulses Skin: No rashes, lesions or ulcers Psychiatry: Judgement and insight appear normal. Mood &  affect appropriate.    Data Reviewed: I have personally reviewed following labs and imaging studies  CBC: Recent Labs  Lab 08/24/21 1416 08/25/21 0518 08/26/21 0616  WBC 8.9 10.1 8.7   NEUTROABS 6.1  --   --   HGB 14.0 13.4 12.2  HCT 42.6 40.3 36.4  MCV 78.3* 78.7* 78.6*  PLT 366 304 924   Basic Metabolic Panel: Recent Labs  Lab 08/24/21 1416 08/25/21 0518 08/26/21 0616 08/28/21 0529  NA 130* 134* 140 136  K 4.5 3.7 3.3* 4.7  CL 95* 97* 103 106  CO2 '23 24 27 22  '$ GLUCOSE 538* 386* 144* 228*  BUN '10 9 10 12  '$ CREATININE 0.89 0.64 0.73 0.69  CALCIUM 9.2 8.9 9.0 9.0  MG  --  1.6* 1.9 1.9   GFR: Estimated Creatinine Clearance: 86.9 mL/min (by C-G formula based on SCr of 0.69 mg/dL). Liver Function Tests: No results for input(s): "AST", "ALT", "ALKPHOS", "BILITOT", "PROT", "ALBUMIN" in the last 168 hours. No results for input(s): "LIPASE", "AMYLASE" in the last 168 hours. No results for input(s): "AMMONIA" in the last 168 hours. Coagulation Profile: No results for input(s): "INR", "PROTIME" in the last 168 hours. Cardiac Enzymes: No results for input(s): "CKTOTAL", "CKMB", "CKMBINDEX", "TROPONINI" in the last 168 hours. BNP (last 3 results) No results for input(s): "PROBNP" in the last 8760 hours. HbA1C: No results for input(s): "HGBA1C" in the last 72 hours. CBG: Recent Labs  Lab 08/27/21 1114 08/27/21 1637 08/27/21 2126 08/28/21 0519 08/28/21 0754  GLUCAP 243* 88 151* 211* 237*   Lipid Profile: No results for input(s): "CHOL", "HDL", "LDLCALC", "TRIG", "CHOLHDL", "LDLDIRECT" in the last 72 hours. Thyroid Function Tests: No results for input(s): "TSH", "T4TOTAL", "FREET4", "T3FREE", "THYROIDAB" in the last 72 hours. Anemia Panel: No results for input(s): "VITAMINB12", "FOLATE", "FERRITIN", "TIBC", "IRON", "RETICCTPCT" in the last 72 hours. Sepsis Labs: Recent Labs  Lab 08/24/21 1503 08/24/21 2030 08/24/21 2038  PROCALCITON  --  <0.10  --   LATICACIDVEN 2.8*  --  1.7    Recent Results (from the past 240 hour(s))  Culture, blood (routine x 2)     Status: None (Preliminary result)   Collection Time: 08/24/21  3:03 PM   Specimen: BLOOD   Result Value Ref Range Status   Specimen Description BLOOD LEFT ANTECUBITAL  Final   Special Requests   Final    BOTTLES DRAWN AEROBIC ONLY Blood Culture results may not be optimal due to an inadequate volume of blood received in culture bottles   Culture   Final    NO GROWTH 4 DAYS Performed at [facility] [address] [place], [place] [address]    Report Status PENDING  Incomplete  Resp Panel by RT-PCR (Flu A&B, Covid) Anterior Nasal Swab     Status: None   Collection Time: 08/24/21  3:04 PM   Specimen: Anterior Nasal Swab  Result Value Ref Range Status   SARS Coronavirus 2 by RT PCR NEGATIVE NEGATIVE Final    Comment: (NOTE) SARS-CoV-2 target nucleic acids are NOT DETECTED.  The SARS-CoV-2 RNA is generally detectable in upper respiratory specimens during the acute phase of infection. The lowest concentration of SARS-CoV-2 viral copies this assay can detect is 138 copies/mL. A negative result does not preclude SARS-Cov-2 infection and should not be used as the sole basis for treatment or other patient management decisions. A negative result may occur with  improper specimen collection/handling, submission of specimen other than nasopharyngeal swab, presence of  viral mutation(s) within the areas targeted by this assay, and inadequate number of viral copies(<138 copies/mL). A negative result must be combined with clinical observations, patient history, and epidemiological information. The expected result is Negative.  Fact Sheet for Patients:  EntrepreneurPulse.com.au  Fact Sheet for Healthcare Providers:  IncredibleEmployment.be  This test is no t yet approved or cleared by the [place] FDA and  has been authorized for detection and/or diagnosis of SARS-CoV-2 by FDA under an Emergency Use Authorization (EUA). This EUA will remain  in effect (meaning this test can be used) for the duration of the COVID-19 declaration under  Section 564(b)(1) of the Act, 21 U.S.C.section 360bbb-3(b)(1), unless the authorization is terminated  or revoked sooner.       Influenza A by PCR NEGATIVE NEGATIVE Final   Influenza B by PCR NEGATIVE NEGATIVE Final    Comment: (NOTE) The Xpert Xpress SARS-CoV-2/FLU/RSV plus assay is intended as an aid in the diagnosis of influenza from Nasopharyngeal swab specimens and should not be used as a sole basis for treatment. Nasal washings and aspirates are unacceptable for Xpert Xpress SARS-CoV-2/FLU/RSV testing.  Fact Sheet for Patients: EntrepreneurPulse.com.au  Fact Sheet for Healthcare Providers: IncredibleEmployment.be  This test is not yet approved or cleared by the [place] FDA and has been authorized for detection and/or diagnosis of SARS-CoV-2 by FDA under an Emergency Use Authorization (EUA). This EUA will remain in effect (meaning this test can be used) for the duration of the COVID-19 declaration under Section 564(b)(1) of the Act, 21 U.S.C. section 360bbb-3(b)(1), unless the authorization is terminated or revoked.  Performed at [facility], [place] [address], [place], [place] [address]   Culture, blood (routine x 2)     Status: None (Preliminary result)   Collection Time: 08/24/21  3:08 PM   Specimen: BLOOD RIGHT FOREARM  Result Value Ref Range Status   Specimen Description BLOOD RIGHT FOREARM  Final   Special Requests   Final    BOTTLES DRAWN AEROBIC AND ANAEROBIC Blood Culture results may not be optimal due to an inadequate volume of blood received in culture bottles   Culture   Final    NO GROWTH 4 DAYS Performed at [facility], [place] [address] [place], [place] [address]    Report Status PENDING  Incomplete  Respiratory (~20 pathogens) panel by PCR     Status: None   Collection Time: 08/24/21  8:47 PM   Specimen: Nasopharyngeal Swab; Respiratory  Result Value Ref Range Status   Adenovirus NOT  DETECTED NOT DETECTED Final   Coronavirus 229E NOT DETECTED NOT DETECTED Final    Comment: (NOTE) The Coronavirus on the Respiratory Panel, DOES NOT test for the novel  Coronavirus (2019 nCoV)    Coronavirus HKU1 NOT DETECTED NOT DETECTED Final   Coronavirus NL63 NOT DETECTED NOT DETECTED Final   Coronavirus OC43 NOT DETECTED NOT DETECTED Final   Metapneumovirus NOT DETECTED NOT DETECTED Final   Rhinovirus / Enterovirus NOT DETECTED NOT DETECTED Final   Influenza A NOT DETECTED NOT DETECTED Final   Influenza B NOT DETECTED NOT DETECTED Final   Parainfluenza Virus 1 NOT DETECTED NOT DETECTED Final   Parainfluenza Virus 2 NOT DETECTED NOT DETECTED Final   Parainfluenza Virus 3 NOT DETECTED NOT DETECTED Final   Parainfluenza Virus 4 NOT DETECTED NOT DETECTED Final   Respiratory Syncytial Virus NOT DETECTED NOT DETECTED Final   Bordetella pertussis NOT DETECTED NOT DETECTED Final   Bordetella Parapertussis NOT DETECTED NOT  DETECTED Final   Chlamydophila pneumoniae NOT DETECTED NOT DETECTED Final   Mycoplasma pneumoniae NOT DETECTED NOT DETECTED Final    Comment: Performed at [facility], [place] [address], [place],  [address]         Radiology Studies: DG Abd 1 View  Result Date: 08/27/2021 CLINICAL DATA:  Vomiting EXAM: ABDOMEN - 1 VIEW COMPARISON:  07/10/2021 FINDINGS: Bowel gas pattern is nonspecific. No abnormal masses or calcifications are seen. Lower pelvis is not included in the image. Metallic sutures are seen in the sternum. Surgical clips are seen in upper abdomen. IMPRESSION: Nonspecific bowel gas pattern. Electronically Signed   By: Elmer Picker M.D.   On: 08/27/2021 13:20        Scheduled Meds:  atorvastatin  80 mg Oral Daily   bisacodyl  10 mg Rectal Once   budesonide (PULMICORT) nebulizer solution  0.25 mg Nebulization BID   clopidogrel  75 mg Oral Daily   enoxaparin (LOVENOX) injection  40 mg Subcutaneous Q24H   ezetimibe  10 mg Oral Daily    gabapentin  100 mg Oral TID   hydrOXYzine  25 mg Oral QHS   insulin aspart  0-15 Units Subcutaneous TID WC   insulin aspart  0-5 Units Subcutaneous QHS   insulin aspart  5 Units Subcutaneous TID WC   insulin glargine-yfgn  30 Units Subcutaneous QHS   ipratropium-albuterol  3 mL Nebulization BID   LORazepam  0.5 mg Oral QHS   melatonin  3 mg Oral QHS   methimazole  5 mg Oral Daily   metoprolol tartrate  100 mg Oral BID   nystatin   Topical TID   pantoprazole  40 mg Oral Daily   polyethylene glycol  17 g Oral BID   tamsulosin  0.4 mg Oral QHS   topiramate  25 mg Oral BID   Continuous Infusions:     LOS: 3 days    Time spent: 35mns    JKathie Dike MD [facility]   If 7PM-7AM, please contact night-coverage [email or web address]  08/28/2021, 11:00 AM    Addendum: Patient continues to have abdominal discomfort despite having a bowel movement after suppository.  Started on SBO protocol with Gastrografin.

## 2021-08-29 ENCOUNTER — Inpatient Hospital Stay (HOSPITAL_COMMUNITY): Payer: Medicare Other

## 2021-08-29 DIAGNOSIS — J209 Acute bronchitis, unspecified: Secondary | ICD-10-CM | POA: Diagnosis not present

## 2021-08-29 LAB — CBC
HCT: 37.5 % (ref 36.0–46.0)
Hemoglobin: 12.3 g/dL (ref 12.0–15.0)
MCH: 26.4 pg (ref 26.0–34.0)
MCHC: 32.8 g/dL (ref 30.0–36.0)
MCV: 80.5 fL (ref 80.0–100.0)
Platelets: 309 10*3/uL (ref 150–400)
RBC: 4.66 MIL/uL (ref 3.87–5.11)
RDW: 15.6 % — ABNORMAL HIGH (ref 11.5–15.5)
WBC: 9.3 10*3/uL (ref 4.0–10.5)
nRBC: 0 % (ref 0.0–0.2)

## 2021-08-29 LAB — BASIC METABOLIC PANEL
Anion gap: 9 (ref 5–15)
BUN: 13 mg/dL (ref 6–20)
CO2: 21 mmol/L — ABNORMAL LOW (ref 22–32)
Calcium: 8.7 mg/dL — ABNORMAL LOW (ref 8.9–10.3)
Chloride: 105 mmol/L (ref 98–111)
Creatinine, Ser: 0.58 mg/dL (ref 0.44–1.00)
GFR, Estimated: >60 mL/min (ref >60)
Glucose, Bld: 294 mg/dL — ABNORMAL HIGH (ref 70–99)
Potassium: 4.4 mmol/L (ref 3.5–5.1)
Sodium: 135 mmol/L (ref 135–145)

## 2021-08-29 LAB — GLUCOSE, CAPILLARY
Glucose-Capillary: 312 mg/dL — ABNORMAL HIGH (ref 70–99)
Glucose-Capillary: 275 mg/dL — ABNORMAL HIGH (ref 70–99)
Glucose-Capillary: 242 mg/dL — ABNORMAL HIGH (ref 70–99)
Glucose-Capillary: 307 mg/dL — ABNORMAL HIGH (ref 70–99)

## 2021-08-29 LAB — AMMONIA: Ammonia: 25 umol/L (ref 9–35)

## 2021-08-29 LAB — CULTURE, BLOOD (ROUTINE X 2)
Culture: NO GROWTH
Culture: NO GROWTH

## 2021-08-29 LAB — VITAMIN B12: Vitamin B-12: 496 pg/mL (ref 180–914)

## 2021-08-29 MED ORDER — SODIUM CHLORIDE 0.9 % IV SOLN: INTRAVENOUS | Status: DC

## 2021-08-29 MED ORDER — HYDROXYZINE HCL 25 MG PO TABS
25.0000 mg | ORAL_TABLET | Freq: Every evening | ORAL | Status: DC | PRN
  Administered 2021-08-29: 25 mg via ORAL
  Filled 2021-08-29: qty 1

## 2021-08-29 MED ORDER — INSULIN GLARGINE-YFGN 100 UNIT/ML ~~LOC~~ SOLN
35.0000 [IU] | Freq: Every day | SUBCUTANEOUS | Status: DC
  Administered 2021-08-29: 35 [IU] via SUBCUTANEOUS
  Filled 2021-08-29 (×2): qty 0.35

## 2021-08-29 NOTE — TOC Progression Note (Signed)
Transition of Care [facility]) - Progression Note    Patient Details  Name: Lori [name] MRN: [id number] Date of Birth: July 22, 1970  Transition of Care [facility]) CM/SW [place], LCSW Phone Number: 08/29/2021, 2:32 PM  Clinical Narrative:    Pt has accepted bed offer at [place]. Pt is found to have MCR part A and will not have to sign over her check to cover cost of SNF.         Expected Discharge Plan and Services                                                 Social Determinants of Health (SDOH) Interventions    Readmission Risk Interventions    08/29/2021    2:32 PM 07/18/2021   12:43 PM 07/11/2021    1:22 PM  Readmission Risk Prevention Plan  Transportation Screening Complete Complete Complete  Medication Review Press photographer) Complete Complete Complete  PCP or Specialist appointment within 3-5 days of discharge Complete Complete   HRI or Home Care Consult Complete Complete Complete  SW Recovery Care/Counseling Consult Complete Complete Complete  Palliative Care Screening Not Applicable Not Applicable Not Applicable  Skilled Nursing Facility Complete Complete Complete

## 2021-08-29 NOTE — Progress Notes (Addendum)
    OVERNIGHT PROGRESS REPORT  Notified by RN for numerous thin/ watery stools. (7+/- since PM shift start 08/28/21 1900 hrs) Fecal management in use.  Gershon Cull MSNA MSN ACNPC-AG Acute Care Nurse Practitioner [place]

## 2021-08-29 NOTE — Progress Notes (Signed)
PROGRESS NOTE    Lori [name]  XVQ:[id number] DOB: 1970/09/01 DOA: 08/24/2021 PCP: [name], NP   Brief Narrative: 51 year old with past medical history significant for diabetes, previous stroke with left-sided hemiparesis and dysphagia presented to the hospital with shortness of breath.  Chest x-ray was unrevealing for pneumonia.  She was treated for bronchitis.  She was also complaining of abdominal pain and vomiting.  KUB was unrevealing.  Patient was treated for possible constipation.  She received oral Gastrografin, she subsequently had multiple bowel movement developed diarrhea.  Today on my evaluation she is sleepy, lethargic very slow to answer questions.  She was minimally able to move her right lower extremity.  MRI brain was obtained was negative for intracranial abnormality, remote left [place] territory infarct and a small remote pontine midbrain basal ganglia cerebral hemisphere infarct similar to prior MRI.   Assessment & Plan:   Principal Problem:   Acute bronchitis Active Problems:   Type 2 diabetes mellitus with hyperlipidemia (HCC)   Essential hypertension   History of cerebral infarction   Pseudohyponatremia   Prolonged QT interval   Nausea and vomiting   1-Acute Bronchitis:  Viral respiratory panel negative.  Chest x-ray negative for pneumonia. She was treated  supportively with bronchodilators. Improved  2-Acute metabolic encephalopathy: Patient appears to be more lethargic today, she has chronic left-sided hemiparesis.  She was able minimally to move right lower extremity. MRI brain ordered.  We will change Atarax to as needed Check ammonia.   3-Prolong QT;  Magnesium replaced.  Repeat EKG.   4-Diabetes type 2;  -Continue with basal insulin and SSI.   5-HTN;  Continue with metoprolol.   6-Constipation;  Nausea vomiting, abdominal pain. Was started on MiraLAX twice daily. Received suppository without significant improvement She  received oral Gastrografin.  Subsequently developed diarrhea   Prior history of stroke, left hemiparesis, dysphagia Continue with Plavix and statins We will need rehab  Hyperthyroidism: Continue with methimazole     Estimated body mass index is 34.2 kg/m as calculated from the following:   Height as of this encounter: '5\' 2"'$  (1.575 m).   Weight as of this encounter: 84.8 kg.   DVT prophylaxis: Lovenox Code Status: Full code Family Communication: No family at bedside Disposition Plan:  Status is: Inpatient Remains inpatient appropriate because: Management of bronchitis, constipation, no altered mental status    Consultants:  None  Procedures:  None  Antimicrobials:    Subjective: She would open eyes to voice. She is lethargic.   Objective: Vitals:   08/28/21 1333 08/28/21 1948 08/28/21 1950 08/29/21 0633  BP: 136/86  (!) 143/94 121/86  Pulse: 75  77 95  Resp: '20  18 16  '$ Temp: 98.2 F (36.8 C)  98.8 F (37.1 C) 98.4 F (36.9 C)  TempSrc: Oral  Oral Oral  SpO2: 97% 99% 100% 95%  Weight:      Height:        Intake/Output Summary (Last 24 hours) at 08/29/2021 0803 Last data filed at 08/28/2021 2000 Gross per 24 hour  Intake --  Output 750 ml  Net -750 ml   Filed Weights   08/24/21 1345  Weight: 84.8 kg    Examination:  General exam: Appears calm and comfortable  Respiratory system: Clear to auscultation. Respiratory effort normal. Cardiovascular system: S1 & S2 heard, RRR. No JVD, murmurs, rubs, gallops or clicks. No pedal edema. Gastrointestinal system: Abdomen is nondistended, soft and nontender. No organomegaly or masses felt. Normal bowel sounds  heard. Central nervous system: sleepy, left side hemiparesis. Right LE 3/5 could have been effort related to Lethargic. She was later able to bend her leg.  Extremities: no edema    Data Reviewed: I have personally reviewed following labs and imaging studies  CBC: Recent Labs  Lab 08/24/21 1416  08/25/21 0518 08/26/21 0616  WBC 8.9 10.1 8.7  NEUTROABS 6.1  --   --   HGB 14.0 13.4 12.2  HCT 42.6 40.3 36.4  MCV 78.3* 78.7* 78.6*  PLT 366 304 767   Basic Metabolic Panel: Recent Labs  Lab 08/24/21 1416 08/25/21 0518 08/26/21 0616 08/28/21 0529  NA 130* 134* 140 136  K 4.5 3.7 3.3* 4.7  CL 95* 97* 103 106  CO2 '23 24 27 22  '$ GLUCOSE 538* 386* 144* 228*  BUN '10 9 10 12  '$ CREATININE 0.89 0.64 0.73 0.69  CALCIUM 9.2 8.9 9.0 9.0  MG  --  1.6* 1.9 1.9   GFR: Estimated Creatinine Clearance: 86.9 mL/min (by C-G formula based on SCr of 0.69 mg/dL). Liver Function Tests: No results for input(s): "AST", "ALT", "ALKPHOS", "BILITOT", "PROT", "ALBUMIN" in the last 168 hours. No results for input(s): "LIPASE", "AMYLASE" in the last 168 hours. No results for input(s): "AMMONIA" in the last 168 hours. Coagulation Profile: No results for input(s): "INR", "PROTIME" in the last 168 hours. Cardiac Enzymes: No results for input(s): "CKTOTAL", "CKMB", "CKMBINDEX", "TROPONINI" in the last 168 hours. BNP (last 3 results) No results for input(s): "PROBNP" in the last 8760 hours. HbA1C: No results for input(s): "HGBA1C" in the last 72 hours. CBG: Recent Labs  Lab 08/28/21 0519 08/28/21 0754 08/28/21 1145 08/28/21 1647 08/28/21 1952  GLUCAP 211* 237* 276* 277* 156*   Lipid Profile: No results for input(s): "CHOL", "HDL", "LDLCALC", "TRIG", "CHOLHDL", "LDLDIRECT" in the last 72 hours. Thyroid Function Tests: No results for input(s): "TSH", "T4TOTAL", "FREET4", "T3FREE", "THYROIDAB" in the last 72 hours. Anemia Panel: No results for input(s): "VITAMINB12", "FOLATE", "FERRITIN", "TIBC", "IRON", "RETICCTPCT" in the last 72 hours. Sepsis Labs: Recent Labs  Lab 08/24/21 1503 08/24/21 2030 08/24/21 2038  PROCALCITON  --  <0.10  --   LATICACIDVEN 2.8*  --  1.7    Recent Results (from the past 240 hour(s))  Culture, blood (routine x 2)     Status: None (Preliminary result)    Collection Time: 08/24/21  3:03 PM   Specimen: BLOOD  Result Value Ref Range Status   Specimen Description BLOOD LEFT ANTECUBITAL  Final   Special Requests   Final    BOTTLES DRAWN AEROBIC ONLY Blood Culture results may not be optimal due to an inadequate volume of blood received in culture bottles   Culture   Final    NO GROWTH 4 DAYS Performed at [facility] [address] [place], [place] [address]    Report Status PENDING  Incomplete  Resp Panel by RT-PCR (Flu A&B, Covid) Anterior Nasal Swab     Status: None   Collection Time: 08/24/21  3:04 PM   Specimen: Anterior Nasal Swab  Result Value Ref Range Status   SARS Coronavirus 2 by RT PCR NEGATIVE NEGATIVE Final    Comment: (NOTE) SARS-CoV-2 target nucleic acids are NOT DETECTED.  The SARS-CoV-2 RNA is generally detectable in upper respiratory specimens during the acute phase of infection. The lowest concentration of SARS-CoV-2 viral copies this assay can detect is 138 copies/mL. A negative result does not preclude SARS-Cov-2 infection and should not be used as the  sole basis for treatment or other patient management decisions. A negative result may occur with  improper specimen collection/handling, submission of specimen other than nasopharyngeal swab, presence of viral mutation(s) within the areas targeted by this assay, and inadequate number of viral copies(<138 copies/mL). A negative result must be combined with clinical observations, patient history, and epidemiological information. The expected result is Negative.  Fact Sheet for Patients:  EntrepreneurPulse.com.au  Fact Sheet for Healthcare Providers:  IncredibleEmployment.be  This test is no t yet approved or cleared by the [place] FDA and  has been authorized for detection and/or diagnosis of SARS-CoV-2 by FDA under an Emergency Use Authorization (EUA). This EUA will remain  in effect (meaning this test can be used)  for the duration of the COVID-19 declaration under Section 564(b)(1) of the Act, 21 U.S.C.section 360bbb-3(b)(1), unless the authorization is terminated  or revoked sooner.       Influenza A by PCR NEGATIVE NEGATIVE Final   Influenza B by PCR NEGATIVE NEGATIVE Final    Comment: (NOTE) The Xpert Xpress SARS-CoV-2/FLU/RSV plus assay is intended as an aid in the diagnosis of influenza from Nasopharyngeal swab specimens and should not be used as a sole basis for treatment. Nasal washings and aspirates are unacceptable for Xpert Xpress SARS-CoV-2/FLU/RSV testing.  Fact Sheet for Patients: EntrepreneurPulse.com.au  Fact Sheet for Healthcare Providers: IncredibleEmployment.be  This test is not yet approved or cleared by the [place] FDA and has been authorized for detection and/or diagnosis of SARS-CoV-2 by FDA under an Emergency Use Authorization (EUA). This EUA will remain in effect (meaning this test can be used) for the duration of the COVID-19 declaration under Section 564(b)(1) of the Act, 21 U.S.C. section 360bbb-3(b)(1), unless the authorization is terminated or revoked.  Performed at [facility], [place] [address], [place], [place] [address]   Culture, blood (routine x 2)     Status: None (Preliminary result)   Collection Time: 08/24/21  3:08 PM   Specimen: BLOOD RIGHT FOREARM  Result Value Ref Range Status   Specimen Description BLOOD RIGHT FOREARM  Final   Special Requests   Final    BOTTLES DRAWN AEROBIC AND ANAEROBIC Blood Culture results may not be optimal due to an inadequate volume of blood received in culture bottles   Culture   Final    NO GROWTH 4 DAYS Performed at [facility], [place] [address], [place], [place] [address]    Report Status PENDING  Incomplete  Respiratory (~20 pathogens) panel by PCR     Status: None   Collection Time: 08/24/21  8:47 PM   Specimen: Nasopharyngeal Swab; Respiratory   Result Value Ref Range Status   Adenovirus NOT DETECTED NOT DETECTED Final   Coronavirus 229E NOT DETECTED NOT DETECTED Final    Comment: (NOTE) The Coronavirus on the Respiratory Panel, DOES NOT test for the novel  Coronavirus (2019 nCoV)    Coronavirus HKU1 NOT DETECTED NOT DETECTED Final   Coronavirus NL63 NOT DETECTED NOT DETECTED Final   Coronavirus OC43 NOT DETECTED NOT DETECTED Final   Metapneumovirus NOT DETECTED NOT DETECTED Final   Rhinovirus / Enterovirus NOT DETECTED NOT DETECTED Final   Influenza A NOT DETECTED NOT DETECTED Final   Influenza B NOT DETECTED NOT DETECTED Final   Parainfluenza Virus 1 NOT DETECTED NOT DETECTED Final   Parainfluenza Virus 2 NOT DETECTED NOT DETECTED Final   Parainfluenza Virus 3 NOT DETECTED NOT DETECTED Final   Parainfluenza Virus 4 NOT DETECTED NOT  DETECTED Final   Respiratory Syncytial Virus NOT DETECTED NOT DETECTED Final   Bordetella pertussis NOT DETECTED NOT DETECTED Final   Bordetella Parapertussis NOT DETECTED NOT DETECTED Final   Chlamydophila pneumoniae NOT DETECTED NOT DETECTED Final   Mycoplasma pneumoniae NOT DETECTED NOT DETECTED Final    Comment: Performed at [facility], [place] [address], [place], [place] [address]         Radiology Studies: DG Abd Portable 1V-Small Bowel Obstruction Protocol-initial, 8 hr delay  Result Date: 08/29/2021 CLINICAL DATA:  8 hour delay film EXAM: PORTABLE ABDOMEN - 1 VIEW COMPARISON:  08/27/2021 FINDINGS: Nonobstructive bowel gas pattern. Contrast opacifies the colon from the cecum to the rectum. Visualized osseous structures are within normal limits. IMPRESSION: Nonobstructive bowel gas pattern. Contrast opacifies to the rectum. Electronically Signed   By: Julian Hy M.D.   On: 08/29/2021 02:48   DG Abd 1 View  Result Date: 08/27/2021 CLINICAL DATA:  Vomiting EXAM: ABDOMEN - 1 VIEW COMPARISON:  07/10/2021 FINDINGS: Bowel gas pattern is nonspecific. No abnormal masses or  calcifications are seen. Lower pelvis is not included in the image. Metallic sutures are seen in the sternum. Surgical clips are seen in upper abdomen. IMPRESSION: Nonspecific bowel gas pattern. Electronically Signed   By: Elmer Picker M.D.   On: 08/27/2021 13:20        Scheduled Meds:  atorvastatin  80 mg Oral Daily   budesonide (PULMICORT) nebulizer solution  0.25 mg Nebulization BID   clopidogrel  75 mg Oral Daily   enoxaparin (LOVENOX) injection  40 mg Subcutaneous Q24H   ezetimibe  10 mg Oral Daily   gabapentin  100 mg Oral TID   hydrOXYzine  25 mg Oral QHS   insulin aspart  0-15 Units Subcutaneous TID WC   insulin aspart  0-5 Units Subcutaneous QHS   insulin aspart  5 Units Subcutaneous TID WC   insulin glargine-yfgn  30 Units Subcutaneous QHS   ipratropium-albuterol  3 mL Nebulization BID   LORazepam  0.5 mg Oral QHS   melatonin  3 mg Oral QHS   methimazole  5 mg Oral Daily   metoprolol tartrate  100 mg Oral BID   nystatin   Topical TID   pantoprazole  40 mg Oral Daily   polyethylene glycol  17 g Oral BID   tamsulosin  0.4 mg Oral QHS   topiramate  25 mg Oral BID   Continuous Infusions:   LOS: 4 days    Time spent: 35 minutes   Karita Dralle A Jullien Granquist, MD [facility]   If 7PM-7AM, please contact night-coverage [email or web address]  08/29/2021, 8:03 AM

## 2021-08-30 DIAGNOSIS — J209 Acute bronchitis, unspecified: Secondary | ICD-10-CM | POA: Diagnosis not present

## 2021-08-30 LAB — GLUCOSE, CAPILLARY: Glucose-Capillary: 233 mg/dL — ABNORMAL HIGH (ref 70–99)

## 2021-08-30 MED ORDER — IPRATROPIUM-ALBUTEROL 0.5-2.5 (3) MG/3ML IN SOLN: 3.0000 mL | Freq: Two times a day (BID) | RESPIRATORY_TRACT | 0 refills | Status: DC

## 2021-08-30 MED ORDER — LANTUS SOLOSTAR 100 UNIT/ML ~~LOC~~ SOPN: 35.0000 [IU] | PEN_INJECTOR | Freq: Every day | SUBCUTANEOUS | 4 refills | Status: DC

## 2021-08-30 MED ORDER — MELATONIN 3 MG PO TABS: 3.0000 mg | ORAL_TABLET | Freq: Every day | ORAL | 0 refills | Status: DC

## 2021-08-30 MED ORDER — HYDROXYZINE HCL 25 MG PO TABS: 25.0000 mg | ORAL_TABLET | Freq: Every evening | ORAL | 0 refills | Status: DC | PRN

## 2021-08-30 MED ORDER — BUDESONIDE 0.25 MG/2ML IN SUSP: 0.2500 mg | Freq: Two times a day (BID) | RESPIRATORY_TRACT | 12 refills | Status: DC

## 2021-08-30 NOTE — TOC Transition Note (Addendum)
Transition of Care [facility]) - CM/SW Discharge Note   Patient Details  Name: Lori [name] MRN: [id number] Date of Birth: 1970-05-30  Transition of Care [facility]) CM/SW Contact:  Servando Snare, LCSW Phone Number: 08/30/2021, 10:09 AM   Clinical Narrative:    Patient to transfer to [facility]. Patient to transfer by [facility]. Room 510 B. RN number for report: [phone].  Patient asked that LCSW call family. LCSW attempted to call family. Phone number for spouse going to voicemail and voicemail is not set up. Daughters number has a different name on voicemail than listed in chart. No message left. Patient notified.   Patient also requesting to be changed to regular diet. Patient reports regular diet at baseline. Attending notified.   Daughter notified by phone/text in room with patient. Address text to daughter, [name].   Final next level of care: Skilled Nursing Facility Barriers to Discharge: No Barriers Identified   Patient Goals and CMS Choice   CMS Medicare.gov Compare Post Acute Care list provided to:: Patient Choice offered to / list presented to : Patient  Discharge Placement              Patient chooses bed at: Other - please specify in the comment section below: [facility]) Patient to be transferred to facility by: [place] Name of family member notified: Patient to notify family    Discharge Plan and Services                DME Arranged: N/A DME Agency: NA       HH Arranged: NA [place] Agency: NA        Social Determinants of Health (SDOH) Interventions     Readmission Risk Interventions    08/29/2021    2:32 PM 07/18/2021   12:43 PM 07/11/2021    1:22 PM  Readmission Risk Prevention Plan  Transportation Screening Complete Complete Complete  Medication Review Press photographer) Complete Complete Complete  PCP or Specialist appointment within 3-5 days of discharge Complete Complete   HRI or Home Care Consult Complete Complete  Complete  SW Recovery Care/Counseling Consult Complete Complete Complete  Palliative Care Screening Not Applicable Not Applicable Not Applicable  Skilled Nursing Facility Complete Complete Complete

## 2021-08-30 NOTE — Progress Notes (Signed)
Pt discharged to [facility]. Report called and given to [place], Doctor, hospital. No concerns voiced. Pt left unit on stretcher pushed by ambulance staff. Left in stable condition.

## 2021-08-30 NOTE — Discharge Summary (Addendum)
Physician Discharge Summary   Patient: Lori [name] MRN: [id number] DOB: Mar 20, 1970  Admit date:     08/24/2021  Discharge date: 08/30/21  Discharge Physician: Elmarie Shiley   PCP: [name], NP   Recommendations at discharge:   Monitor renal function.  SNF for Rehab.   Discharge Diagnoses: Principal Problem:   Acute bronchitis Active Problems:   Type 2 diabetes mellitus with hyperlipidemia (HCC)   Essential hypertension   History of cerebral infarction   Pseudohyponatremia   Prolonged QT interval   Nausea and vomiting  Resolved Problems:   * No resolved hospital problems. *  Hospital Course: 51 year old with past medical history significant for diabetes, previous stroke with left-sided hemiparesis and dysphagia presented to the hospital with shortness of breath.  Chest x-ray was unrevealing for pneumonia.  She was treated for bronchitis.  She was also complaining of abdominal pain and vomiting.  KUB was unrevealing.  Patient was treated for possible constipation.  She received oral Gastrografin, she subsequently had multiple bowel movement developed diarrhea.   She was more  lethargic very slow to answer questions 7/26.  She was minimally able to move her right lower extremity.  MRI brain was obtained was negative for intracranial abnormality, remote left PICA  territory infarct and a small remote pontine midbrain basal ganglia cerebral hemisphere infarct similar to prior MRI. Atarax was change to PRN at HS. She is more alert today, slow to answer questions, probably related to prior stroke.  She is stable for transfer to SNF>   Assessment and Plan: 1-Acute Bronchitis:  Viral respiratory panel negative.  Chest x-ray negative for pneumonia. She was treated  supportively with bronchodilators. Improved. Continue with duo neb at discharge BID.    2-Acute metabolic encephalopathy: Secondary to acute illness, bronchitis/  Patient appears to be more lethargic  7/26, she has chronic left-sided hemiparesis.  She was able minimally to move right lower extremity. MRI brain ordered. negative for acute stroke.  Change Atarax to as needed Ammonia normal.  She is more alert, following command, moves right lower and upper extremities. Suspect oversedation. Atarax change to PRN.   3-Prolong QT;  Magnesium replaced.  EKG. 446   4-Diabetes type 2;  -Continue with basal insulin and SSI.    5-HTN;  Continue with metoprolol.   Resume lisinopril at discharge.   6-Constipation;  Nausea vomiting, abdominal pain. She was started on MiraLAX twice daily. Received suppository without significant improvement She received oral Gastrografin.  Subsequently developed diarrhea Resolved, tolerating diet.    Prior history of stroke, left hemiparesis, dysphagia Continue with Plavix and statins We will need rehab   Hyperthyroidism: Continue with methimazole            Consultants: None Procedures performed: \None Disposition: Skilled nursing facility Diet recommendation:  Discharge Diet Orders (From admission, onward)     Start     Ordered   08/30/21 0000  Diet - low sodium heart healthy        08/30/21 0934           Carb modified diet Dysphagia 3 diet.  DISCHARGE MEDICATION: Allergies as of 08/30/2021       Reactions   Sumatriptan Anaphylaxis   Contrast Media [iodinated Contrast Media] Swelling   Penicillins Swelling   Mouth swells up and eyes swollen shut        Medication List     STOP taking these medications    butalbital-acetaminophen-caffeine 50-325-40 MG tablet Commonly known as: [employer]  loratadine 10 MG tablet Commonly known as: CLARITIN   metoCLOPramide 10 MG tablet Commonly known as: REGLAN   ondansetron 4 MG tablet Commonly known as: ZOFRAN   rosuvastatin 40 MG tablet Commonly known as: CRESTOR       TAKE these medications    acetaminophen 325 MG tablet Commonly known as: TYLENOL Take 2 tablets  (650 mg total) by mouth every 6 (six) hours as needed for mild pain or headache.   atorvastatin 80 MG tablet Commonly known as: LIPITOR Take 80 mg by mouth daily.   BD Pen Needle Nano U/F 32G X 4 MM Misc Generic drug: Insulin Pen Needle Use to inject insulin 4 times daily as directed.   blood glucose meter kit and supplies Kit Dispense based on patient and insurance preference. Use up to four times daily as directed.   budesonide 0.25 MG/2ML nebulizer solution Commonly known as: PULMICORT Take 2 mLs (0.25 mg total) by nebulization 2 (two) times daily.   clopidogrel 75 MG tablet Commonly known as: PLAVIX TAKE 1 TABLET BY MOUTH ONCE DAILY   diclofenac Sodium 1 % Gel Commonly known as: VOLTAREN Apply 4 g topically 4 (four) times daily. What changed:  how much to take when to take this   empagliflozin 10 MG Tabs tablet Commonly known as: Jardiance Take 1 tablet (10 mg total) by mouth daily before breakfast.   escitalopram 20 MG tablet Commonly known as: LEXAPRO TAKE 1 TABLET BY MOUTH EVERY MORNING What changed: when to take this   ezetimibe 10 MG tablet Commonly known as: Zetia Take 1 tablet (10 mg total) by mouth daily.   FreeStyle Libre 2 Reader [place] Use to check blood sugar 3 times daily.   FreeStyle Libre 2 Sensor Misc Use to check blood sugar 3 times daily.   gabapentin 100 MG capsule Commonly known as: NEURONTIN TAKE 1 CAPSULE BY MOUTH THREE TIMES DAILY (BREAKFAST, LUNCH, BEDTIME) What changed: See the new instructions.   hydrOXYzine 25 MG tablet Commonly known as: ATARAX Take 1 tablet (25 mg total) by mouth at bedtime as needed for anxiety. What changed:  when to take this reasons to take this   Insulin Aspart FlexPen 100 UNIT/ML Commonly known as: NOVOLOG ADMINISTER 4 UNITS UNDER THE SKIN THREE TIMES DAILY WITH MEALS What changed: See the new instructions.   ipratropium-albuterol 0.5-2.5 (3) MG/3ML Soln Commonly known as: DUONEB Take 3 mLs by  nebulization 2 (two) times daily.   Lantus SoloStar 100 UNIT/ML Solostar Pen Generic drug: insulin glargine Inject 35 Units into the skin at bedtime. What changed: how much to take   lip balm ointment Apply topically as needed for lip care.   lisinopril 20 MG tablet Commonly known as: ZESTRIL Take 20 mg by mouth daily.   LORazepam 0.5 MG tablet Commonly known as: ATIVAN TAKE 1 TABLET BY MOUTH AT BEDTIME   melatonin 3 MG Tabs tablet Take 1 tablet (3 mg total) by mouth at bedtime. What changed: how much to take   methimazole 5 MG tablet Commonly known as: TAPAZOLE TAKE 1 TABLET BY MOUTH EVERY MORNING What changed: when to take this   metoprolol tartrate 100 MG tablet Commonly known as: LOPRESSOR TAKE 1 TABLET BY MOUTH TWICE DAILY (BREAKFAST, BEDTIME) What changed: See the new instructions.   nystatin cream Commonly known as: MYCOSTATIN Apply 1 Application topically 2 (two) times daily. Affected areas on breast fold and groin and sacral areas What changed: when to take this   pantoprazole 40 MG tablet  Commonly known as: PROTONIX TAKE 1 TABLET BY MOUTH EVERY MORNING What changed: when to take this   polyethylene glycol powder 17 GM/SCOOP powder Commonly known as: GLYCOLAX/MIRALAX Take 17 g by mouth 2 (two) times daily. What changed: when to take this   senna 8.6 MG tablet Commonly known as: SENOKOT Take 1 tablet by mouth as needed for constipation.   tamsulosin 0.4 MG Caps capsule Commonly known as: FLOMAX TAKE 1 CAPSULE BY MOUTH AT BEDTIME What changed: when to take this   topiramate 25 MG tablet Commonly known as: TOPAMAX TAKE 1 TABLET(25 MG) BY MOUTH TWICE DAILY What changed: See the new instructions.   zinc oxide 11.3 % Crea cream Commonly known as: BALMEX Apply 1 Application topically 2 (two) times daily.        Contact information for after-discharge care     [facility] .    Service: Skilled Nursing Contact information: [address] [place] [phone]                    Discharge Exam: Danley Danker Weights   08/24/21 1345  Weight: 84.8 kg   General, alert, slow to answer questions.   Condition at discharge: stable  The results of significant diagnostics from this hospitalization (including imaging, microbiology, ancillary and laboratory) are listed below for reference.   Imaging Studies: MR BRAIN WO CONTRAST  Result Date: 08/29/2021 CLINICAL DATA:  Altered mental status. EXAM: MRI HEAD WITHOUT CONTRAST TECHNIQUE: Multiplanar, multiecho pulse sequences of the brain and surrounding structures were obtained without intravenous contrast. COMPARISON:  Head CT July 10, 2021 FINDINGS: Brain: No acute infarction, hemorrhage, hydrocephalus, extra-axial collection or mass lesion. Remote left PICA territory infarct. Small remote infarcts with associated susceptibility artifact are seen on the right side of the pons, right middle cerebral peduncle, thalami, basal ganglia, corpus callosum, bilateral the corona radiata and left frontal lobe. Scattered and confluent T2 hyperintensity within the white matter of the cerebral hemispheres, nonspecific, likely related to chronic microangiopathy. Findings have not significantly changed since prior MRI. Vascular: Absent flow void in the left vertebral artery may represent slow flow versus occlusion. Skull and upper cervical spine: Normal marrow signal. Sinuses/Orbits: Negative. Other: None. IMPRESSION: 1. No acute intracranial abnormality. 2. Remote left PICA territory infarct and small remote pontine, midbrain, basal ganglia and cerebral hemisphere infarcts, similar to prior MRI. 3. Absent flow void in the left vertebral artery may represent slow flow versus occlusion. Electronically Signed   By: Pedro Earls M.D.   On: 08/29/2021 12:08   DG Abd Portable 1V-Small Bowel Obstruction  Protocol-initial, 8 hr delay  Result Date: 08/29/2021 CLINICAL DATA:  8 hour delay film EXAM: PORTABLE ABDOMEN - 1 VIEW COMPARISON:  08/27/2021 FINDINGS: Nonobstructive bowel gas pattern. Contrast opacifies the colon from the cecum to the rectum. Visualized osseous structures are within normal limits. IMPRESSION: Nonobstructive bowel gas pattern. Contrast opacifies to the rectum. Electronically Signed   By: Julian Hy M.D.   On: 08/29/2021 02:48   DG Abd 1 View  Result Date: 08/27/2021 CLINICAL DATA:  Vomiting EXAM: ABDOMEN - 1 VIEW COMPARISON:  07/10/2021 FINDINGS: Bowel gas pattern is nonspecific. No abnormal masses or calcifications are seen. Lower pelvis is not included in the image. Metallic sutures are seen in the sternum. Surgical clips are seen in upper abdomen. IMPRESSION: Nonspecific bowel gas pattern. Electronically Signed   By: Prudy Feeler.D.  On: 08/27/2021 13:20   DG Chest 2 View  Result Date: 08/24/2021 CLINICAL DATA:  shob EXAM: CHEST - 2 VIEW COMPARISON:  July 10, 2021 FINDINGS: The heart size and mediastinal contours are within normal limits. Again seen are sternotomy wires and mediastinal surgical clips. There are streaky opacities seen in the right upper lobe and left perihilar region and are more prominent in the present study. Linear scar at the right lower lung. No consolidation, pleural effusion or vascular congestion. The visualized skeletal structures are unremarkable. IMPRESSION: Streaky opacities in the right upper lobe and at the left perihilar region and are more prominent in the present study of peribronchial thickening and/or airspace disease. No consolidation. Electronically Signed   By: Frazier Richards M.D.   On: 08/24/2021 14:41    Microbiology: Results for orders placed or performed during the hospital encounter of 08/24/21  Culture, blood (routine x 2)     Status: None   Collection Time: 08/24/21  3:03 PM   Specimen: BLOOD  Result Value Ref Range  Status   Specimen Description BLOOD LEFT ANTECUBITAL  Final   Special Requests   Final    BOTTLES DRAWN AEROBIC ONLY Blood Culture results may not be optimal due to an inadequate volume of blood received in culture bottles   Culture   Final    NO GROWTH 5 DAYS Performed at [facility], [place] [address] [place], [place] [address]    Report Status 08/29/2021 FINAL  Final  Resp Panel by RT-PCR (Flu A&B, Covid) Anterior Nasal Swab     Status: None   Collection Time: 08/24/21  3:04 PM   Specimen: Anterior Nasal Swab  Result Value Ref Range Status   SARS Coronavirus 2 by RT PCR NEGATIVE NEGATIVE Final    Comment: (NOTE) SARS-CoV-2 target nucleic acids are NOT DETECTED.  The SARS-CoV-2 RNA is generally detectable in upper respiratory specimens during the acute phase of infection. The lowest concentration of SARS-CoV-2 viral copies this assay can detect is 138 copies/mL. A negative result does not preclude SARS-Cov-2 infection and should not be used as the sole basis for treatment or other patient management decisions. A negative result may occur with  improper specimen collection/handling, submission of specimen other than nasopharyngeal swab, presence of viral mutation(s) within the areas targeted by this assay, and inadequate number of viral copies(<138 copies/mL). A negative result must be combined with clinical observations, patient history, and epidemiological information. The expected result is Negative.  Fact Sheet for Patients:  EntrepreneurPulse.com.au  Fact Sheet for Healthcare Providers:  IncredibleEmployment.be  This test is no t yet approved or cleared by the [place] FDA and  has been authorized for detection and/or diagnosis of SARS-CoV-2 by FDA under an Emergency Use Authorization (EUA). This EUA will remain  in effect (meaning this test can be used) for the duration of the COVID-19 declaration under Section 564(b)(1) of  the Act, 21 U.S.C.section 360bbb-3(b)(1), unless the authorization is terminated  or revoked sooner.       Influenza A by PCR NEGATIVE NEGATIVE Final   Influenza B by PCR NEGATIVE NEGATIVE Final    Comment: (NOTE) The Xpert Xpress SARS-CoV-2/FLU/RSV plus assay is intended as an aid in the diagnosis of influenza from Nasopharyngeal swab specimens and should not be used as a sole basis for treatment. Nasal washings and aspirates are unacceptable for Xpert Xpress SARS-CoV-2/FLU/RSV testing.  Fact Sheet for Patients: EntrepreneurPulse.com.au  Fact Sheet for Healthcare Providers: IncredibleEmployment.be  This test is not  yet approved or cleared by the [place] and has been authorized for detection and/or diagnosis of SARS-CoV-2 by FDA under an Emergency Use Authorization (EUA). This EUA will remain in effect (meaning this test can be used) for the duration of the COVID-19 declaration under Section 564(b)(1) of the Act, 21 U.S.C. section 360bbb-3(b)(1), unless the authorization is terminated or revoked.  Performed at [facility], [place] [address] [place], [place] [address]   Culture, blood (routine x 2)     Status: None   Collection Time: 08/24/21  3:08 PM   Specimen: BLOOD RIGHT FOREARM  Result Value Ref Range Status   Specimen Description BLOOD RIGHT FOREARM  Final   Special Requests   Final    BOTTLES DRAWN AEROBIC AND ANAEROBIC Blood Culture results may not be optimal due to an inadequate volume of blood received in culture bottles   Culture   Final    NO GROWTH 5 DAYS Performed at [facility], [place] [address] [place], [place] [address]    Report Status 08/29/2021 FINAL  Final  Respiratory (~20 pathogens) panel by PCR     Status: None   Collection Time: 08/24/21  8:47 PM   Specimen: Nasopharyngeal Swab; Respiratory  Result Value Ref Range Status   Adenovirus NOT DETECTED NOT DETECTED Final    Coronavirus 229E NOT DETECTED NOT DETECTED Final    Comment: (NOTE) The Coronavirus on the Respiratory Panel, DOES NOT test for the novel  Coronavirus (2019 nCoV)    Coronavirus HKU1 NOT DETECTED NOT DETECTED Final   Coronavirus NL63 NOT DETECTED NOT DETECTED Final   Coronavirus OC43 NOT DETECTED NOT DETECTED Final   Metapneumovirus NOT DETECTED NOT DETECTED Final   Rhinovirus / Enterovirus NOT DETECTED NOT DETECTED Final   Influenza A NOT DETECTED NOT DETECTED Final   Influenza B NOT DETECTED NOT DETECTED Final   Parainfluenza Virus 1 NOT DETECTED NOT DETECTED Final   Parainfluenza Virus 2 NOT DETECTED NOT DETECTED Final   Parainfluenza Virus 3 NOT DETECTED NOT DETECTED Final   Parainfluenza Virus 4 NOT DETECTED NOT DETECTED Final   Respiratory Syncytial Virus NOT DETECTED NOT DETECTED Final   Bordetella pertussis NOT DETECTED NOT DETECTED Final   Bordetella Parapertussis NOT DETECTED NOT DETECTED Final   Chlamydophila pneumoniae NOT DETECTED NOT DETECTED Final   Mycoplasma pneumoniae NOT DETECTED NOT DETECTED Final    Comment: Performed at [facility], [place] [address], [place], [place] [address]    Labs: CBC: Recent Labs  Lab 08/24/21 1416 08/25/21 0518 08/26/21 0616 08/29/21 0823  WBC 8.9 10.1 8.7 9.3  NEUTROABS 6.1  --   --   --   HGB 14.0 13.4 12.2 12.3  HCT 42.6 40.3 36.4 37.5  MCV 78.3* 78.7* 78.6* 80.5  PLT 366 304 295 659   Basic Metabolic Panel: Recent Labs  Lab 08/24/21 1416 08/25/21 0518 08/26/21 0616 08/28/21 0529 08/29/21 0823  NA 130* 134* 140 136 135  K 4.5 3.7 3.3* 4.7 4.4  CL 95* 97* 103 106 105  CO2 23 24 27 22  21*  GLUCOSE 538* 386* 144* 228* 294*  BUN 10 9 10 12 13   CREATININE 0.89 0.64 0.73 0.69 0.58  CALCIUM 9.2 8.9 9.0 9.0 8.7*  MG  --  1.6* 1.9 1.9  --    Liver Function Tests: No results for input(s): "AST", "ALT", "ALKPHOS", "BILITOT", "PROT", "ALBUMIN" in the last 168 hours. CBG: Recent Labs  Lab 08/29/21 [phone]  08/29/21  1213 08/29/21 1727 08/29/21 2145 08/30/21 0742  GLUCAP 312* 275* 242* 307* 233*    Discharge time spent: greater than 30 minutes.  Signed: Elmarie Shiley, MD [facility] 08/30/2021

## 2021-08-31 ENCOUNTER — Telehealth: Payer: Self-pay

## 2021-08-31 NOTE — Telephone Encounter (Signed)
Transition Care Management Unsuccessful Follow-up Telephone Call  Date of discharge and from where:  [facility]; 08/30/2021  Attempts:  1st Attempt  Reason for unsuccessful TCM follow-up call:  Unable to reach patient;  [facility] .   Service: Skilled Chiropodist information: [address] [place] [phone]

## 2021-09-14 ENCOUNTER — Encounter
Payer: Medicaid Other | Attending: Physical Medicine and Rehabilitation | Admitting: Physical Medicine and Rehabilitation

## 2021-09-14 DIAGNOSIS — G8194 Hemiplegia, unspecified affecting left nondominant side: Secondary | ICD-10-CM | POA: Insufficient documentation

## 2021-10-16 ENCOUNTER — Emergency Department (HOSPITAL_COMMUNITY): Payer: Medicaid Other

## 2021-10-16 ENCOUNTER — Telehealth: Payer: Self-pay | Admitting: *Deleted

## 2021-10-16 ENCOUNTER — Encounter: Payer: Medicaid Other | Admitting: Registered"

## 2021-10-16 ENCOUNTER — Other Ambulatory Visit: Payer: Self-pay

## 2021-10-16 ENCOUNTER — Emergency Department (HOSPITAL_COMMUNITY)
Admission: EM | Admit: 2021-10-16 | Discharge: 2021-10-17 | Disposition: A | Discharge status: Home / Self Care | Payer: Medicaid Other | Attending: Emergency Medicine | Admitting: Emergency Medicine

## 2021-10-16 ENCOUNTER — Encounter (HOSPITAL_COMMUNITY): Payer: Self-pay | Admitting: Emergency Medicine

## 2021-10-16 DIAGNOSIS — Z794 Long term (current) use of insulin: Secondary | ICD-10-CM | POA: Diagnosis not present

## 2021-10-16 DIAGNOSIS — Z7902 Long term (current) use of antithrombotics/antiplatelets: Secondary | ICD-10-CM | POA: Diagnosis not present

## 2021-10-16 DIAGNOSIS — Z20822 Contact with and (suspected) exposure to covid-19: Secondary | ICD-10-CM | POA: Insufficient documentation

## 2021-10-16 DIAGNOSIS — E119 Type 2 diabetes mellitus without complications: Secondary | ICD-10-CM | POA: Insufficient documentation

## 2021-10-16 DIAGNOSIS — R052 Subacute cough: Secondary | ICD-10-CM | POA: Insufficient documentation

## 2021-10-16 DIAGNOSIS — R062 Wheezing: Secondary | ICD-10-CM | POA: Diagnosis not present

## 2021-10-16 DIAGNOSIS — Z8673 Personal history of transient ischemic attack (TIA), and cerebral infarction without residual deficits: Secondary | ICD-10-CM | POA: Insufficient documentation

## 2021-10-16 DIAGNOSIS — R059 Cough, unspecified: Secondary | ICD-10-CM | POA: Diagnosis present

## 2021-10-16 DIAGNOSIS — R0981 Nasal congestion: Secondary | ICD-10-CM | POA: Diagnosis not present

## 2021-10-16 LAB — CBC WITH DIFFERENTIAL/PLATELET
Abs Immature Granulocytes: 0.03 10*3/uL (ref 0.00–0.07)
Basophils Absolute: 0.1 10*3/uL (ref 0.0–0.1)
Basophils Relative: 1 %
Eosinophils Absolute: 0.6 10*3/uL — ABNORMAL HIGH (ref 0.0–0.5)
Eosinophils Relative: 6 %
HCT: 44.3 % (ref 36.0–46.0)
Hemoglobin: 14 g/dL (ref 12.0–15.0)
Immature Granulocytes: 0 %
Lymphocytes Relative: 37 %
Lymphs Abs: 3.6 10*3/uL (ref 0.7–4.0)
MCH: 25.7 pg — ABNORMAL LOW (ref 26.0–34.0)
MCHC: 31.6 g/dL (ref 30.0–36.0)
MCV: 81.4 fL (ref 80.0–100.0)
Monocytes Absolute: 0.7 10*3/uL (ref 0.1–1.0)
Monocytes Relative: 8 %
Neutro Abs: 4.7 10*3/uL (ref 1.7–7.7)
Neutrophils Relative %: 48 %
Platelets: 450 10*3/uL — ABNORMAL HIGH (ref 150–400)
RBC: 5.44 MIL/uL — ABNORMAL HIGH (ref 3.87–5.11)
RDW: 15.7 % — ABNORMAL HIGH (ref 11.5–15.5)
WBC: 9.7 10*3/uL (ref 4.0–10.5)
nRBC: 0 % (ref 0.0–0.2)

## 2021-10-16 LAB — BLOOD GAS, ARTERIAL
Acid-Base Excess: 0.4 mmol/L (ref 0.0–2.0)
Acid-Base Excess: 0.8 mmol/L (ref 0.0–2.0)
Bicarbonate: 28 mmol/L (ref 20.0–28.0)
Bicarbonate: 25.3 mmol/L (ref 20.0–28.0)
Drawn by: 30136
O2 Saturation: 31.6 %
O2 Saturation: 97.5 %
Patient temperature: 37
Patient temperature: 37.1
pCO2 arterial: 57 mmHg — ABNORMAL HIGH (ref 32–48)
pCO2 arterial: 39 mmHg (ref 32–48)
pH, Arterial: 7.3 — ABNORMAL LOW (ref 7.35–7.45)
pH, Arterial: 7.42 (ref 7.35–7.45)
pO2, Arterial: <31 mmHg — CL (ref 83–108)
pO2, Arterial: 81 mmHg — ABNORMAL LOW (ref 83–108)

## 2021-10-16 LAB — COMPREHENSIVE METABOLIC PANEL
ALT: 10 U/L (ref 0–44)
AST: 18 U/L (ref 15–41)
Albumin: 3.6 g/dL (ref 3.5–5.0)
Alkaline Phosphatase: 73 U/L (ref 38–126)
Anion gap: 10 (ref 5–15)
BUN: 13 mg/dL (ref 6–20)
CO2: 21 mmol/L — ABNORMAL LOW (ref 22–32)
Calcium: 9.3 mg/dL (ref 8.9–10.3)
Chloride: 107 mmol/L (ref 98–111)
Creatinine, Ser: 1.2 mg/dL — ABNORMAL HIGH (ref 0.44–1.00)
GFR, Estimated: 55 mL/min — ABNORMAL LOW (ref >60)
Glucose, Bld: 148 mg/dL — ABNORMAL HIGH (ref 70–99)
Potassium: 4.8 mmol/L (ref 3.5–5.1)
Sodium: 138 mmol/L (ref 135–145)
Total Bilirubin: 1 mg/dL (ref 0.3–1.2)
Total Protein: 7.7 g/dL (ref 6.5–8.1)

## 2021-10-16 LAB — SARS CORONAVIRUS 2 BY RT PCR: SARS Coronavirus 2 by RT PCR: NEGATIVE

## 2021-10-16 MED ORDER — METHYLPREDNISOLONE SODIUM SUCC 125 MG IJ SOLR: 125.0000 mg | Freq: Once | INTRAMUSCULAR | Status: DC

## 2021-10-16 MED ORDER — ALBUTEROL SULFATE HFA 108 (90 BASE) MCG/ACT IN AERS
2.0000 | INHALATION_SPRAY | RESPIRATORY_TRACT | Status: DC
  Administered 2021-10-16: 2 via RESPIRATORY_TRACT
  Filled 2021-10-16: qty 6.7

## 2021-10-16 MED ORDER — AEROCHAMBER Z-STAT PLUS/MEDIUM MISC
1.0000 | Freq: Once | Status: AC
  Administered 2021-10-16: 1
  Filled 2021-10-16: qty 1

## 2021-10-16 MED ORDER — ALBUTEROL SULFATE (2.5 MG/3ML) 0.083% IN NEBU
2.5000 mg | INHALATION_SOLUTION | Freq: Once | RESPIRATORY_TRACT | Status: AC
  Administered 2021-10-16: 2.5 mg via RESPIRATORY_TRACT
  Filled 2021-10-16: qty 3

## 2021-10-16 MED ORDER — PREDNISONE 20 MG PO TABS
60.0000 mg | ORAL_TABLET | Freq: Once | ORAL | Status: AC
  Administered 2021-10-16: 60 mg via ORAL
  Filled 2021-10-16: qty 3

## 2021-10-16 MED ORDER — PREDNISONE 10 MG PO TABS: 20.0000 mg | ORAL_TABLET | Freq: Every day | ORAL | 0 refills | Status: AC

## 2021-10-16 NOTE — Discharge Instructions (Signed)
Please continue prednisone as prescribed Please use your albuterol inhaler 2 puffs up to every 4 hours Recheck with your doctor this week Return if you are having worsening symptoms especially worsening shortness of breath, fever, or chills

## 2021-10-16 NOTE — ED Notes (Signed)
Delay in labs d/t patient wishing to eat/finish eating before she has them drawn

## 2021-10-16 NOTE — ED Notes (Signed)
2 staff members unsuccessful with lab draw/iv access.  Awaiting IV team consult

## 2021-10-16 NOTE — ED Provider Notes (Signed)
[facility] DEPT Provider Note   CSN: [id number] Arrival date & time: 10/16/21  1207     History  Chief Complaint  Patient presents with   Cough    Lori [name] is a 51 y.o. female.  HPI 51 year old female history of previous stroke, diabetes with left-sided hemiparesis, dysphagia, history of bronchitis presents today complaining of cough and congestion.  Lori [name] states that Lori [name] went to rehab and was discharged home on Friday.  Had ongoing cough and congestion.  Lori [name] has some mild dyspnea.  Lori [name] thinks that Lori [name] has bronchitis.  Lori [name] denies any fever, chills, nausea, vomiting, diarrhea.  States that Lori [name] has been at her baseline since getting home and has been able to transfer.  Lori [name] reports her husband is at home caring for her.    Home Medications Prior to Admission medications   Medication Sig Start Date End Date Taking? Authorizing Provider  predniSONE (DELTASONE) 10 MG tablet Take 2 tablets (20 mg total) by mouth daily for 5 days. 10/16/21 10/21/21 Yes Pattricia Boss, MD  acetaminophen (TYLENOL) 325 MG tablet Take 2 tablets (650 mg total) by mouth every 6 (six) hours as needed for mild pain or headache. Patient not taking: Reported on 08/25/2021 02/26/21   Flora Lipps, MD  atorvastatin (LIPITOR) 80 MG tablet Take 80 mg by mouth daily. 06/25/21   [provider]  blood glucose meter kit and supplies KIT Dispense based on patient and insurance preference. Use up to four times daily as directed. 03/16/21   Love, Ivan Anchors, PA-C  budesonide (PULMICORT) 0.25 MG/2ML nebulizer solution Take 2 mLs (0.25 mg total) by nebulization 2 (two) times daily. 08/30/21   Regalado, Belkys A, MD  clopidogrel (PLAVIX) 75 MG tablet TAKE 1 TABLET BY MOUTH ONCE DAILY Patient taking differently: Take 75 mg by mouth daily. 05/23/21   Ngetich, Dinah C, NP  Continuous Blood Gluc Receiver (FREESTYLE LIBRE 2 READER) DEVI Use to check blood sugar 3 times daily. 04/19/21   Charlott Rakes, MD  Continuous Blood Gluc Sensor (FREESTYLE LIBRE 2 SENSOR) MISC Use to check blood sugar 3 times daily. 04/19/21   Charlott Rakes, MD  diclofenac Sodium (VOLTAREN) 1 % GEL Apply 4 g topically 4 (four) times daily. Patient taking differently: Apply 1 Application topically in the morning, at noon, and at bedtime. 03/15/21   Love, Ivan Anchors, PA-C  empagliflozin (JARDIANCE) 10 MG TABS tablet Take 1 tablet (10 mg total) by mouth daily before breakfast. 06/25/21   Early Osmond, MD  escitalopram (LEXAPRO) 20 MG tablet TAKE 1 TABLET BY MOUTH EVERY MORNING Patient taking differently: Take 20 mg by mouth daily. 05/23/21   Ngetich, Dinah C, NP  ezetimibe (ZETIA) 10 MG tablet Take 1 tablet (10 mg total) by mouth daily. 05/23/21   Garvin Fila, MD  gabapentin (NEURONTIN) 100 MG capsule TAKE 1 CAPSULE BY MOUTH THREE TIMES DAILY (BREAKFAST, LUNCH, BEDTIME) Patient taking differently: Take 100 mg by mouth 3 (three) times daily. 05/23/21   Ngetich, Dinah C, NP  hydrOXYzine (ATARAX) 25 MG tablet Take 1 tablet (25 mg total) by mouth at bedtime as needed for anxiety. 08/30/21   Regalado, Belkys A, MD  Insulin Aspart FlexPen (NOVOLOG) 100 UNIT/ML ADMINISTER 4 UNITS UNDER THE SKIN THREE TIMES DAILY WITH MEALS Patient taking differently: Inject 3 Units into the skin in the morning, at noon, and at bedtime. 07/09/21   Ngetich, Dinah C, NP  insulin glargine (LANTUS SOLOSTAR) 100 UNIT/ML Solostar Pen Inject 35  Units into the skin at bedtime. 08/30/21   Regalado, Belkys A, MD  Insulin Pen Needle 32G X 4 MM MISC Use to inject insulin 4 times daily as directed. 03/15/21   Love, Ivan Anchors, PA-C  ipratropium-albuterol (DUONEB) 0.5-2.5 (3) MG/3ML SOLN Take 3 mLs by nebulization 2 (two) times daily. 08/30/21   Regalado, Belkys A, MD  lip balm (CARMEX) ointment Apply topically as needed for lip care. 03/16/21   Love, Ivan Anchors, PA-C  lisinopril (ZESTRIL) 20 MG tablet Take 20 mg by mouth daily.    [provider]  LORazepam  (ATIVAN) 0.5 MG tablet TAKE 1 TABLET BY MOUTH AT BEDTIME Patient taking differently: Take 0.5 mg by mouth at bedtime. 05/23/21   Ngetich, Dinah C, NP  melatonin 3 MG TABS tablet Take 1 tablet (3 mg total) by mouth at bedtime. 08/30/21   Regalado, Belkys A, MD  methimazole (TAPAZOLE) 5 MG tablet TAKE 1 TABLET BY MOUTH EVERY MORNING Patient taking differently: Take 5 mg by mouth daily. 05/23/21   Ngetich, Dinah C, NP  metoprolol tartrate (LOPRESSOR) 100 MG tablet TAKE 1 TABLET BY MOUTH TWICE DAILY (BREAKFAST, BEDTIME) Patient taking differently: Take 100 mg by mouth 2 (two) times daily. 05/23/21   Ngetich, Dinah C, NP  nystatin cream (MYCOSTATIN) Apply 1 Application topically 2 (two) times daily. Affected areas on breast fold and groin and sacral areas Patient taking differently: Apply 1 Application topically 3 (three) times daily. Affected areas on breast fold and groin and sacral areas 07/31/21   Ngetich, Dinah C, NP  pantoprazole (PROTONIX) 40 MG tablet TAKE 1 TABLET BY MOUTH EVERY MORNING Patient taking differently: Take 40 mg by mouth daily. 05/23/21   Ngetich, Dinah C, NP  polyethylene glycol powder (GLYCOLAX/MIRALAX) 17 GM/SCOOP powder Take 17 g by mouth 2 (two) times daily. Patient taking differently: Take 17 g by mouth daily. 03/15/21   Love, Ivan Anchors, PA-C  senna (SENOKOT) 8.6 MG tablet Take 1 tablet by mouth as needed for constipation.    [provider]  tamsulosin (FLOMAX) 0.4 MG CAPS capsule TAKE 1 CAPSULE BY MOUTH AT BEDTIME Patient taking differently: Take 0.4 mg by mouth daily. 05/23/21   Ngetich, Dinah C, NP  topiramate (TOPAMAX) 25 MG tablet TAKE 1 TABLET(25 MG) BY MOUTH TWICE DAILY Patient taking differently: Take 25 mg by mouth 2 (two) times daily. 06/22/21   Ngetich, Dinah C, NP  zinc oxide (BALMEX) 11.3 % CREA cream Apply 1 Application topically 2 (two) times daily. 07/31/21   Ngetich, Dinah C, NP      Allergies    Sumatriptan, Contrast media [iodinated contrast media], and  Penicillins    Review of Systems   Review of Systems  Physical Exam Updated Vital Signs BP (!) 124/99   Pulse 91   Temp 98.1 F (36.7 C) (Oral)   Resp (!) 24   SpO2 98%  Physical Exam Vitals and nursing note reviewed.  Constitutional:      General: Lori [name] is not in acute distress.    Appearance: Normal appearance. Lori [name] is obese.  HENT:     Head: Normocephalic.     Right Ear: External ear normal.     Left Ear: External ear normal.     Nose: Nose normal.     Mouth/Throat:     Pharynx: Oropharynx is clear.  Eyes:     Extraocular Movements: Extraocular movements intact.     Pupils: Pupils are equal, round, and reactive to light.  Cardiovascular:  Rate and Rhythm: Normal rate and regular rhythm.     Pulses: Normal pulses.  Pulmonary:     Effort: Pulmonary effort is normal.     Breath sounds: Wheezing present.     Comments: Some expiratory wheezing noted on exam Abdominal:     General: Bowel sounds are normal. There is no distension.     Palpations: Abdomen is soft.     Tenderness: There is no abdominal tenderness.  Musculoskeletal:        General: Normal range of motion.     Cervical back: Normal range of motion.  Skin:    General: Skin is warm and dry.     Capillary Refill: Capillary refill takes less than 2 seconds.  Neurological:     Mental Status: Lori [name] is alert.     Comments: Left upper extremity with hemiparesis Bilateral lower extremity weakness  Psychiatric:        Mood and Affect: Mood normal.        Behavior: Behavior normal.     ED Results / Procedures / Treatments   Labs (all labs ordered are listed, but only abnormal results are displayed) Labs Reviewed  CBC WITH DIFFERENTIAL/PLATELET - Abnormal; Notable for the following components:      Result Value   RBC 5.44 (*)    MCH 25.7 (*)    RDW 15.7 (*)    Platelets 450 (*)    Eosinophils Absolute 0.6 (*)    All other components within normal limits  COMPREHENSIVE METABOLIC PANEL - Abnormal; Notable  for the following components:   CO2 21 (*)    Glucose, Bld 148 (*)    Creatinine, Ser 1.20 (*)    GFR, Estimated 55 (*)    All other components within normal limits  BLOOD GAS, ARTERIAL - Abnormal; Notable for the following components:   pH, Arterial 7.3 (*)    pCO2 arterial 57 (*)    pO2, Arterial <31 (*)    All other components within normal limits  BLOOD GAS, ARTERIAL - Abnormal; Notable for the following components:   pO2, Arterial 81 (*)    All other components within normal limits  SARS CORONAVIRUS 2 BY RT PCR    EKG None  Radiology DG Chest Port 1 View  Result Date: 10/16/2021 CLINICAL DATA:  Persistent cough for 2 weeks EXAM: PORTABLE CHEST 1 VIEW COMPARISON:  08/24/2021 FINDINGS: Previous median sternotomy noted. Normal heart size and vascularity. Azygos fissure in the right upper lobe, normal variant. No focal pneumonia, collapse or consolidation. Negative for edema, effusion or pneumothorax. Trachea midline. IMPRESSION: No active disease. Electronically Signed   By: Jerilynn Mages.  Shick M.D.   On: 10/16/2021 14:12    Procedures Procedures    Medications Ordered in ED Medications  methylPREDNISolone sodium succinate (SOLU-MEDROL) 125 mg/2 mL injection 125 mg (125 mg Intravenous Not Given 10/16/21 1648)  predniSONE (DELTASONE) tablet 60 mg (has no administration in time range)  albuterol (PROVENTIL) (2.5 MG/3ML) 0.083% nebulizer solution 2.5 mg (2.5 mg Nebulization Given 10/16/21 1424)    ED Course/ Medical Decision Making/ A&P Clinical Course as of 10/16/21 1702  Tue Oct 16, 2021  1517 Chest x-Aden Youngman reviewed and interpreted with no evidence of acute abnormality noted [DR]  1517 ABG significant for pH 7.3 PCO2 57 reported PO2 less than 31 does not appear consistent with arterial blood gas as patient's oxygen saturations are 98% [DR]  1642 Blood gas, arterial (at [facility])(!) ABG reviewed interpreted and pH 7.4 with PCO2  39 PO2 81 this was obtained on room air [DR]  1643 CBC  reviewed interpreted and significant for normal white blood cell count, [DR]  1643 CBC reviewed interpreted and significant for platelets elevated at 450,000 otherwise within normal limits [DR]  1643 SARS Coronavirus 2 by RT PCR (hospital order, performed in [facility] hospital lab) *cepheid single result test* Anterior Nasal Swab Reviewed reviewed interpreted and normal [DR]    Clinical Course User Index [DR] Pattricia Boss, MD                           Medical Decision Making 51 year old female history of stroke presents today with cough and congestion.  Patient is afebrile White blood cell count is normal ABG obtained and PO2 is 80 with normal pH and normal PCO2 Patient has some wheezing on exam Differential diagnosis includes but is not limited to pneumonia, viral infection, COVID, bronchitis and asthma, Patient with cough, doubt PE, doubt coronary syndrome, doubt fluid overload Patient treated with bronchodilators and with prednisone Patient has remained hemodynamically stable Patient will be discharged on albuterol inhaler and prednisone  Amount and/or Complexity of Data Reviewed Labs: ordered. Decision-making details documented in ED Course. Radiology: ordered and independent interpretation performed. Decision-making details documented in ED Course.  Risk Prescription drug management.           Final Clinical Impression(s) / ED Diagnoses Final diagnoses:  Subacute cough  Wheezing    Rx / DC Orders ED Discharge Orders          Ordered    predniSONE (DELTASONE) 10 MG tablet  Daily        10/16/21 1647              Pattricia Boss, MD 10/16/21 1702

## 2021-10-16 NOTE — Telephone Encounter (Signed)
Lauren with [facility] called requesting verbal orders for Nursing 2X3weeks, 1X6weeks. Also evaluation for PT, OT and [facility].   Verbal orders given.

## 2021-10-16 NOTE — ED Triage Notes (Signed)
BIB EMS from home.  Persistent cough x 2 weeks. Bed bound at home.  Previous CVAs.  Left side deficit.  Recent diagnosis of pna and bronchitis.

## 2021-10-19 ENCOUNTER — Telehealth: Payer: Self-pay | Admitting: *Deleted

## 2021-10-19 NOTE — Telephone Encounter (Signed)
[facility] May collect urine specimen for U/A and C/s to r/o UTI

## 2021-10-19 NOTE — Telephone Encounter (Signed)
[name] with [facility] and stated that she saw patient today and she has a Strong odor with Urine and it is cloudy.  No fever and No other symptoms noted.   Please Advise.

## 2021-10-19 NOTE — Telephone Encounter (Signed)
Lori [name] with [facility] notified and agreed.

## 2021-10-22 ENCOUNTER — Ambulatory Visit: Payer: Medicaid Other | Admitting: Family

## 2021-10-22 ENCOUNTER — Encounter: Payer: Self-pay | Admitting: Family

## 2021-10-22 ENCOUNTER — Ambulatory Visit (INDEPENDENT_AMBULATORY_CARE_PROVIDER_SITE_OTHER): Payer: Medicaid Other | Admitting: Family

## 2021-10-22 ENCOUNTER — Other Ambulatory Visit: Payer: Medicaid Other

## 2021-10-22 VITALS — BP 140/88 | HR 76 | Temp 97.5°F | Resp 16 | Ht 62.0 in | Wt 191.2 lb

## 2021-10-22 DIAGNOSIS — I10 Essential (primary) hypertension: Secondary | ICD-10-CM

## 2021-10-22 DIAGNOSIS — E0590 Thyrotoxicosis, unspecified without thyrotoxic crisis or storm: Secondary | ICD-10-CM

## 2021-10-22 DIAGNOSIS — E1169 Type 2 diabetes mellitus with other specified complication: Secondary | ICD-10-CM | POA: Diagnosis not present

## 2021-10-22 DIAGNOSIS — E7849 Other hyperlipidemia: Secondary | ICD-10-CM

## 2021-10-22 DIAGNOSIS — E785 Hyperlipidemia, unspecified: Secondary | ICD-10-CM | POA: Diagnosis not present

## 2021-10-22 DIAGNOSIS — R052 Subacute cough: Secondary | ICD-10-CM | POA: Diagnosis not present

## 2021-10-22 DIAGNOSIS — I639 Cerebral infarction, unspecified: Secondary | ICD-10-CM

## 2021-10-22 MED ORDER — IPRATROPIUM-ALBUTEROL 0.5-2.5 (3) MG/3ML IN SOLN: 3.0000 mL | Freq: Two times a day (BID) | RESPIRATORY_TRACT | 5 refills | Status: DC

## 2021-10-22 MED ORDER — IPRATROPIUM-ALBUTEROL 0.5-2.5 (3) MG/3ML IN SOLN: 3.0000 mL | Freq: Two times a day (BID) | RESPIRATORY_TRACT | 0 refills | Status: DC

## 2021-10-22 MED ORDER — DEXCOM G5 RECEIVER KIT DEVI: 1.0000 | Freq: Four times a day (QID) | 5 refills | Status: DC

## 2021-10-22 MED ORDER — DEXCOM G5 MOBILE TRANSMITTER MISC: 1.0000 | Freq: Four times a day (QID) | 5 refills | Status: AC

## 2021-10-22 MED ORDER — BUDESONIDE 0.25 MG/2ML IN SUSP: 0.2500 mg | Freq: Two times a day (BID) | RESPIRATORY_TRACT | 12 refills | Status: DC

## 2021-10-22 NOTE — Progress Notes (Signed)
Provider: Marlowe Sax FNP-C  Calin Ellery, Nelda Bucks, NP  Patient Care Team: Terina Mcelhinny, Nelda Bucks, NP as PCP - General (Family Medicine) Early Osmond, MD as PCP - Cardiology (Cardiology)  Extended Emergency Contact Information Primary Emergency Contact: Gary Phone: [phone] Mobile Phone: [phone] Relation: Daughter Secondary Emergency Contact: [name],[name] Mobile Phone: [phone] Relation: Spouse Preferred language: English Interpreter needed? No  Code Status:  Full Code  Goals of care: Advanced Directive information    10/22/2021    3:34 PM  Advanced Directives  Does Patient Have a Medical Advance Directive? No  Would patient like information on creating a medical advance directive? No - Patient declined     Chief Complaint  Patient presents with   Acute Visit    Breathing medication refills.    HPI:  Pt is a 51 y.o. female seen today for an acute visit for follow up ED visit on 10/16/2021 after she presented to [facility] with cough and congestion.she was previously in [place] and was discharge on  10/12/2021.chest X-ray done was negative for acute abnormalities. COVID-19 test was negative.Labs were unremarkable except PO2 was < 31 then improved to 81  She was discharge home on Prednisone and continue on Albuterol and Duoneb. She request refill on Nebulizer. She request DexiComb  and supplies states checks blood sugars before each meal and at bedtime plus as needed if high or symptoms of hypoglycemia.    Past Medical History:  Diagnosis Date   CAD (coronary artery disease)    Depression    Diabetes mellitus without complication ([place])    History of CT scan    History of mammogram    History of MRI    Hypertension    Pheochromocytoma    Pheochromocytoma    Stroke ([place])    Thyroid disease    Past Surgical History:  Procedure Laterality Date   ABDOMINAL HYSTERECTOMY     CESAREAN SECTION     3   CORONARY ARTERY BYPASS  GRAFT     Right adrenal gland removal for pheochromocytoma Right     Allergies  Allergen Reactions   Sumatriptan Anaphylaxis   Contrast Media [Iodinated Contrast Media] Swelling   Penicillins Swelling    Mouth swells up and eyes swollen shut    Outpatient Encounter Medications as of 10/22/2021  Medication Sig   acetaminophen (TYLENOL) 325 MG tablet Take 2 tablets (650 mg total) by mouth every 6 (six) hours as needed for mild pain or headache.   atorvastatin (LIPITOR) 80 MG tablet Take 80 mg by mouth daily.   blood glucose meter kit and supplies KIT Dispense based on patient and insurance preference. Use up to four times daily as directed.   budesonide (PULMICORT) 0.25 MG/2ML nebulizer solution Take 2 mLs (0.25 mg total) by nebulization 2 (two) times daily.   clopidogrel (PLAVIX) 75 MG tablet TAKE 1 TABLET BY MOUTH ONCE DAILY   Continuous Blood Gluc Receiver (FREESTYLE LIBRE 2 READER) DEVI Use to check blood sugar 3 times daily.   Continuous Blood Gluc Sensor (FREESTYLE LIBRE 2 SENSOR) MISC Use to check blood sugar 3 times daily.   diclofenac Sodium (VOLTAREN) 1 % GEL Apply 4 g topically 4 (four) times daily.   empagliflozin (JARDIANCE) 10 MG TABS tablet Take 1 tablet (10 mg total) by mouth daily before breakfast.   escitalopram (LEXAPRO) 20 MG tablet TAKE 1 TABLET BY MOUTH EVERY MORNING   ezetimibe (ZETIA) 10 MG tablet Take 1 tablet (10 mg total)  by mouth daily.   gabapentin (NEURONTIN) 100 MG capsule TAKE 1 CAPSULE BY MOUTH THREE TIMES DAILY (BREAKFAST, LUNCH, BEDTIME)   hydrOXYzine (ATARAX) 25 MG tablet Take 1 tablet (25 mg total) by mouth at bedtime as needed for anxiety.   Insulin Aspart FlexPen (NOVOLOG) 100 UNIT/ML ADMINISTER 4 UNITS UNDER THE SKIN THREE TIMES DAILY WITH MEALS   insulin glargine (LANTUS SOLOSTAR) 100 UNIT/ML Solostar Pen Inject 35 Units into the skin at bedtime.   Insulin Pen Needle 32G X 4 MM MISC Use to inject insulin 4 times daily as directed.    ipratropium-albuterol (DUONEB) 0.5-2.5 (3) MG/3ML SOLN Take 3 mLs by nebulization 2 (two) times daily.   lip balm (CARMEX) ointment Apply topically as needed for lip care.   lisinopril (ZESTRIL) 20 MG tablet Take 20 mg by mouth daily.   LORazepam (ATIVAN) 0.5 MG tablet TAKE 1 TABLET BY MOUTH AT BEDTIME   melatonin 3 MG TABS tablet Take 1 tablet (3 mg total) by mouth at bedtime.   methimazole (TAPAZOLE) 5 MG tablet TAKE 1 TABLET BY MOUTH EVERY MORNING   metoprolol tartrate (LOPRESSOR) 100 MG tablet TAKE 1 TABLET BY MOUTH TWICE DAILY (BREAKFAST, BEDTIME)   nystatin cream (MYCOSTATIN) Apply 1 Application topically 2 (two) times daily. Affected areas on breast fold and groin and sacral areas   pantoprazole (PROTONIX) 40 MG tablet TAKE 1 TABLET BY MOUTH EVERY MORNING   polyethylene glycol powder (GLYCOLAX/MIRALAX) 17 GM/SCOOP powder Take 17 g by mouth 2 (two) times daily.   predniSONE (DELTASONE) 10 MG tablet Take 10 mg by mouth in the morning and at bedtime.   senna (SENOKOT) 8.6 MG tablet Take 1 tablet by mouth as needed for constipation.   tamsulosin (FLOMAX) 0.4 MG CAPS capsule TAKE 1 CAPSULE BY MOUTH AT BEDTIME   topiramate (TOPAMAX) 25 MG tablet TAKE 1 TABLET(25 MG) BY MOUTH TWICE DAILY   zinc oxide (BALMEX) 11.3 % CREA cream Apply 1 Application topically 2 (two) times daily.   No facility-administered encounter medications on file as of 10/22/2021.    Review of Systems  Constitutional:  Negative for appetite change, chills, fatigue, fever and unexpected weight change.  HENT:  Negative for congestion, dental problem, ear discharge, ear pain, facial swelling, hearing loss, nosebleeds, postnasal drip, rhinorrhea, sinus pressure, sinus pain, sneezing, sore throat, tinnitus and trouble swallowing.   Eyes:  Negative for pain, discharge, redness, itching and visual disturbance.  Respiratory:  Negative for cough, chest tightness, shortness of breath and wheezing.   Cardiovascular:  Negative for  chest pain, palpitations and leg swelling.  Gastrointestinal:  Negative for abdominal distention, abdominal pain, blood in stool, constipation, diarrhea, nausea and vomiting.  Endocrine: Negative for cold intolerance, heat intolerance, polydipsia, polyphagia and polyuria.  Genitourinary:  Negative for difficulty urinating, dysuria, flank pain, frequency and urgency.  Musculoskeletal:  Positive for gait problem. Negative for arthralgias, back pain, joint swelling, myalgias, neck pain and neck stiffness.  Skin:  Negative for color change, pallor and rash.  Neurological:  Positive for weakness. Negative for dizziness, syncope, speech difficulty, light-headedness, numbness and headaches.       Left side weakness   Hematological:  Does not bruise/bleed easily.  Psychiatric/Behavioral:  Negative for agitation, behavioral problems, confusion, hallucinations and sleep disturbance. The patient is not nervous/anxious.      There is no immunization history on file for this patient. Pertinent  Health Maintenance Due  Topic Date Due   FOOT EXAM  Never done   OPHTHALMOLOGY EXAM  Never done   COLONOSCOPY (Pts 45-41yr Insurance coverage will need to be confirmed)  Never done   INFLUENZA VACCINE  Never done   HEMOGLOBIN A1C  10/25/2021   PAP SMEAR-Modifier  Discontinued      08/28/2021    7:38 AM 08/29/2021    7:37 AM 08/29/2021    8:47 PM 10/16/2021   12:24 PM 10/22/2021    3:32 PM  Fall Risk  Falls in the past year?     0  Was there an injury with Fall?     0  Fall Risk Category Calculator     0  Fall Risk Category     Low  Patient Fall Risk Level Moderate fall risk Moderate fall risk Moderate fall risk Low fall risk Low fall risk  Patient at Risk for Falls Due to     No Fall Risks  Fall risk Follow up     Falls evaluation completed   Functional Status Survey:    Vitals:   10/22/21 1526  BP: (!) 140/88  Pulse: 76  Resp: 16  Temp: (!) 97.5 F (36.4 C)  SpO2: 97%  Weight: 191 lb 3.2 oz  (86.7 kg)  Height: 5' 2"  (1.575 m)   Body mass index is 34.97 kg/m. Physical Exam Vitals reviewed.  Constitutional:      General: She is not in acute distress.    Appearance: Normal appearance. She is obese. She is not ill-appearing or diaphoretic.  HENT:     Head: Normocephalic.     Nose: Nose normal. No congestion or rhinorrhea.     Mouth/Throat:     Mouth: Mucous membranes are moist.     Pharynx: Oropharynx is clear. No oropharyngeal exudate or posterior oropharyngeal erythema.  Eyes:     General: No scleral icterus.       Right eye: No discharge.        Left eye: No discharge.     Conjunctiva/sclera: Conjunctivae normal.     Pupils: Pupils are equal, round, and reactive to light.  Cardiovascular:     Rate and Rhythm: Normal rate and regular rhythm.     Pulses: Normal pulses.     Heart sounds: Normal heart sounds. No murmur heard.    No friction rub. No gallop.  Pulmonary:     Effort: Pulmonary effort is normal. No respiratory distress.     Breath sounds: Normal breath sounds. No wheezing, rhonchi or rales.  Chest:     Chest wall: No tenderness.  Abdominal:     General: Bowel sounds are normal. There is no distension.     Palpations: Abdomen is soft. There is no mass.     Tenderness: There is no abdominal tenderness. There is no right CVA tenderness, left CVA tenderness, guarding or rebound.  Skin:    General: Skin is warm and dry.     Coloration: Skin is not pale.     Findings: No erythema.  Neurological:     Mental Status: She is alert and oriented to person, place, and time.     Motor: No weakness.     Gait: Gait abnormal.  Psychiatric:        Mood and Affect: Mood normal.        Speech: Speech normal.        Behavior: Behavior normal.     Labs reviewed: Recent Labs    02/21/21 0407 02/22/21 0306 08/25/21 0518 08/26/21 0616 08/28/21 0529 08/29/21 0823 10/16/21 1634  NA 134*   < >  134* 140 136 135 138  K 3.5   < > 3.7 3.3* 4.7 4.4 4.8  CL 99   < >  97* 103 106 105 107  CO2 27   < > 24 27 22  21* 21*  GLUCOSE 215*   < > 386* 144* 228* 294* 148*  BUN 7   < > 9 10 12 13 13   CREATININE 0.59   < > 0.64 0.73 0.69 0.58 1.20*  CALCIUM 8.6*   < > 8.9 9.0 9.0 8.7* 9.3  MG 1.7   < > 1.6* 1.9 1.9  --   --   PHOS 4.3  --   --   --   --   --   --    < > = values in this interval not displayed.   Recent Labs    06/11/21 1500 07/10/21 1233 10/16/21 1634  AST 17 13* 18  ALT 19 14 10   ALKPHOS 124 116 73  BILITOT 0.8 1.2 1.0  PROT 8.5* 8.0 7.7  ALBUMIN 4.1 3.5 3.6   Recent Labs    07/31/21 1343 08/24/21 1416 08/25/21 0518 08/26/21 0616 08/29/21 0823 10/16/21 1634  WBC 8.8 8.9   < > 8.7 9.3 9.7  NEUTROABS 6,072 6.1  --   --   --  4.7  HGB 13.4 14.0   < > 12.2 12.3 14.0  HCT 44.2 42.6   < > 36.4 37.5 44.3  MCV 85.2 78.3*   < > 78.6* 80.5 81.4  PLT 503* 366   < > 295 309 450*   < > = values in this interval not displayed.   Lab Results  Component Value Date   TSH 1.072 06/11/2021   Lab Results  Component Value Date   HGBA1C 8.4 (H) 04/24/2021   Lab Results  Component Value Date   CHOL 183 04/24/2021   HDL 49 (L) 04/24/2021   LDLCALC 108 (H) 04/24/2021   TRIG 143 04/24/2021   CHOLHDL 3.7 04/24/2021    Significant Diagnostic Results in last 30 days:  DG Chest Port 1 View  Result Date: 10/16/2021 CLINICAL DATA:  Persistent cough for 2 weeks EXAM: PORTABLE CHEST 1 VIEW COMPARISON:  08/24/2021 FINDINGS: Previous median sternotomy noted. Normal heart size and vascularity. Azygos fissure in the right upper lobe, normal variant. No focal pneumonia, collapse or consolidation. Negative for edema, effusion or pneumothorax. Trachea midline. IMPRESSION: No active disease. Electronically Signed   By: Jerilynn Mages.  Shick M.D.   On: 10/16/2021 14:12    Assessment/Plan  1. Subacute cough Afebrile  - continue on Pulmicort,Duoneb and Albuterol - budesonide (PULMICORT) 0.25 MG/2ML nebulizer solution; Take 2 mLs (0.25 mg total) by nebulization 2  (two) times daily.  Dispense: 60 mL; Refill: 12 - ipratropium-albuterol (DUONEB) 0.5-2.5 (3) MG/3ML SOLN; Take 3 mLs by nebulization 2 (two) times daily.  Dispense: 360 mL; Refill: 5  2. Type 2 diabetes mellitus with hyperlipidemia ([facility]) Lab Results  Component Value Date   HGBA1C 8.4 (H) 04/24/2021  - continue on Jardiance,Novolog and Lantus  - Continuous Blood Gluc Receiver (DEXCOM G5 RECEIVER KIT) DEVI; 1 Device by Does not apply route 4 (four) times daily.  Dispense: 1 each; Refill: 5 - Continuous Blood Gluc Transmit (DEXCOM G5 MOBILE TRANSMITTER) MISC; 1 Application by Does not apply route 4 (four) times daily for 7 days.  Dispense: 1 each; Refill: 5  Family/ staff Communication: Reviewed plan of care with patient and husband   Labs/tests ordered: schedule for lab in the  morning 10/23/2021   Next Appointment: Return if symptoms worsen or fail to improve.   [name], NP

## 2021-10-23 ENCOUNTER — Other Ambulatory Visit: Payer: Medicaid Other

## 2021-10-24 ENCOUNTER — Telehealth: Payer: Self-pay

## 2021-10-24 LAB — CBC WITH DIFFERENTIAL/PLATELET
Absolute Monocytes: 477 cells/uL (ref 200–950)
Basophils Absolute: 72 cells/uL (ref 0–200)
Basophils Relative: 0.8 %
Eosinophils Absolute: 99 cells/uL (ref 15–500)
Eosinophils Relative: 1.1 %
HCT: 43.8 % (ref 35.0–45.0)
Hemoglobin: 14.1 g/dL (ref 11.7–15.5)
Lymphs Abs: 2880 cells/uL (ref 850–3900)
MCH: 25.8 pg — ABNORMAL LOW (ref 27.0–33.0)
MCHC: 32.2 g/dL (ref 32.0–36.0)
MCV: 80.1 fL (ref 80.0–100.0)
MPV: 10.3 fL (ref 7.5–12.5)
Monocytes Relative: 5.3 %
Neutro Abs: 5472 cells/uL (ref 1500–7800)
Neutrophils Relative %: 60.8 %
Platelets: 503 10*3/uL — ABNORMAL HIGH (ref 140–400)
RBC: 5.47 10*6/uL — ABNORMAL HIGH (ref 3.80–5.10)
RDW: 14.8 % (ref 11.0–15.0)
Total Lymphocyte: 32 %
WBC: 9 10*3/uL (ref 3.8–10.8)

## 2021-10-24 LAB — HEMOGLOBIN A1C
Hgb A1c MFr Bld: 10.5 % of total Hgb — ABNORMAL HIGH (ref <5.7)
Mean Plasma Glucose: 255 mg/dL
eAG (mmol/L): 14.1 mmol/L

## 2021-10-24 LAB — LIPID PANEL
Cholesterol: 142 mg/dL (ref <200)
HDL: 51 mg/dL (ref >=50)
LDL Cholesterol (Calc): 70 mg/dL (calc)
Non-HDL Cholesterol (Calc): 91 mg/dL (calc) (ref <130)
Total CHOL/HDL Ratio: 2.8 (calc) (ref <5.0)
Triglycerides: 127 mg/dL (ref <150)

## 2021-10-24 LAB — BASIC METABOLIC PANEL WITH GFR
BUN: 17 mg/dL (ref 7–25)
CO2: 19 mmol/L — ABNORMAL LOW (ref 20–32)
Calcium: 9.4 mg/dL (ref 8.6–10.2)
Chloride: 106 mmol/L (ref 98–110)
Creat: 0.81 mg/dL (ref 0.50–0.99)
Glucose, Bld: 149 mg/dL — ABNORMAL HIGH (ref 65–99)
Potassium: 4.4 mmol/L (ref 3.5–5.3)
Sodium: 138 mmol/L (ref 135–146)
eGFR: 89 mL/min/{1.73_m2} (ref >=60)

## 2021-10-24 LAB — TSH: TSH: 0.93 mIU/L

## 2021-10-24 NOTE — Telephone Encounter (Signed)
Verbal order request received requesting orders for speech therapy once weekly for 8 weeks.  Per [employer] standing order, verbal order given. Message will be sent to patient's provider as a FYI.

## 2021-10-24 NOTE — Telephone Encounter (Signed)
Incoming fax was received from [facility] to initiate a prior authorization for duo-neb.  KEY: [id number]  I made 3 attempts to complete PA via covermymeds and each time I received a message stating cannot find a matching patient, despite me triple checking the patients information.   Side not: the information was already pupulated in covermymeds when I logged in.  I initiated a chat with the covermymeds support team. It was determined that they would fax PA   Awaiting fax

## 2021-10-24 NOTE — Telephone Encounter (Signed)
Noted  

## 2021-10-25 ENCOUNTER — Ambulatory Visit: Payer: Medicaid Other | Admitting: Family

## 2021-10-25 ENCOUNTER — Telehealth: Payer: Self-pay

## 2021-10-25 NOTE — Telephone Encounter (Signed)
Patient would like her results from her urinalysis and if any treatment is needed at this time. Urinalysis report is scanned into her chart from [place]. Please advise.

## 2021-10-25 NOTE — Telephone Encounter (Signed)
Only the preliminary report has come through, awaiting final report.

## 2021-10-26 ENCOUNTER — Telehealth: Payer: Self-pay | Admitting: Nurse Practitioner

## 2021-10-26 ENCOUNTER — Telehealth: Payer: Self-pay | Admitting: Adult Health

## 2021-10-26 DIAGNOSIS — R052 Subacute cough: Secondary | ICD-10-CM

## 2021-10-26 DIAGNOSIS — N3000 Acute cystitis without hematuria: Secondary | ICD-10-CM

## 2021-10-26 MED ORDER — CEPHALEXIN 500 MG PO CAPS: 500.0000 mg | ORAL_CAPSULE | Freq: Three times a day (TID) | ORAL | 0 refills | Status: DC

## 2021-10-26 MED ORDER — ALBUTEROL SULFATE (2.5 MG/3ML) 0.083% IN NEBU: 2.5000 mg | INHALATION_SOLUTION | Freq: Four times a day (QID) | RESPIRATORY_TRACT | 12 refills | Status: DC | PRN

## 2021-10-26 NOTE — Telephone Encounter (Signed)
Discussed results with patient, patient verbalized understanding of results  

## 2021-10-26 NOTE — Telephone Encounter (Signed)
Pt called stating she has not been able to get her inhalers. In review of the chart the Duoneb prescriptions required a prior auth form which was done and sent to the pharmacy. I have called the pharmacy several times and been placed on hold. They are closing soon. I am going to call generic albuterol in to 24 hrs [facility] in order to get her a rescue inhaler on hand soon.

## 2021-10-26 NOTE — Telephone Encounter (Signed)
LMOM to return call.

## 2021-10-26 NOTE — Telephone Encounter (Signed)
Late documentation,   Form was received on 10/24/21, completed, and faxed back

## 2021-10-26 NOTE — Telephone Encounter (Signed)
UTI noted on culture, she has allergy to Penicillins but looks like she has taken keflex (a different antibiotic) in the past without problems. Rx sent to her pharmacy to take three times daily until complete.

## 2021-10-29 ENCOUNTER — Encounter: Payer: Self-pay | Admitting: Family

## 2021-10-29 ENCOUNTER — Encounter: Payer: Medicaid Other | Admitting: Family

## 2021-10-29 NOTE — Progress Notes (Signed)
  This encounter was created in error - please disregard. No show 

## 2021-10-30 ENCOUNTER — Telehealth: Payer: Self-pay | Admitting: Family

## 2021-10-30 DIAGNOSIS — R0602 Shortness of breath: Secondary | ICD-10-CM

## 2021-10-30 NOTE — Telephone Encounter (Signed)
Patient called to report [facility] said they do not have the prescription for the nebulizer called in to the pharmacy at patient's last appointment on 10/22/21 (pulmicort).

## 2021-10-30 NOTE — Telephone Encounter (Signed)
Patient called to request a referral to a lung specialist for difficulty breathing.

## 2021-10-30 NOTE — Telephone Encounter (Signed)
Pulmonology referral ordered.

## 2021-10-31 ENCOUNTER — Ambulatory Visit (INDEPENDENT_AMBULATORY_CARE_PROVIDER_SITE_OTHER): Payer: Medicaid Other | Admitting: Family

## 2021-10-31 ENCOUNTER — Encounter: Payer: Self-pay | Admitting: Family

## 2021-10-31 DIAGNOSIS — I639 Cerebral infarction, unspecified: Secondary | ICD-10-CM

## 2021-10-31 DIAGNOSIS — J4520 Mild intermittent asthma, uncomplicated: Secondary | ICD-10-CM

## 2021-10-31 MED ORDER — BUDESONIDE 0.25 MG/2ML IN SUSP: 0.2500 mg | Freq: Two times a day (BID) | RESPIRATORY_TRACT | 12 refills | Status: DC

## 2021-10-31 MED ORDER — ALBUTEROL SULFATE (2.5 MG/3ML) 0.083% IN NEBU: 2.5000 mg | INHALATION_SOLUTION | Freq: Four times a day (QID) | RESPIRATORY_TRACT | 12 refills | Status: DC | PRN

## 2021-10-31 NOTE — Patient Instructions (Signed)
-   Follow up with Pulmonologist referral already ordered.

## 2021-10-31 NOTE — Progress Notes (Signed)
Provider: Marlowe Sax FNP-C  Charnell Peplinski, Nelda Bucks, NP  Patient Care Team: Kinesha Auten, Nelda Bucks, NP as PCP - General (Family Medicine) Early Osmond, MD as PCP - Cardiology (Cardiology)  Extended Emergency Contact Information Primary Emergency Contact: [place] Phone: [phone] Mobile Phone: [phone] Relation: Daughter Secondary Emergency Contact: [name],[name] Mobile Phone: [phone] Relation: Spouse Preferred language: English Interpreter needed? No  Code Status:  Full Code  Goals of care: Advanced Directive information    10/31/2021    1:20 PM  Advanced Directives  Does Patient Have a Medical Advance Directive? No  Would patient like information on creating a medical advance directive? No - Patient declined     Chief Complaint  Patient presents with   Follow-up    Patient complains that SOB is getting worse.     HPI:  Pt is a 51 y.o. female seen today for an acute visit for evaluation of shortness of breath getting worst.Has ongoing cough.She denies any fever,chills,fatigue,body aches,runny nose,chest tightness,chest pain or palpitation.Has ongoing cough.she is status post multiple hospitalization due to shortness of breath and cough last seen in ED on 10/16/2021.  Has a significant medical history of asthma.  Requests Pulmicort albuterol to be refilled.   Past Medical History:  Diagnosis Date   CAD (coronary artery disease)    Depression    Diabetes mellitus without complication ([place])    History of CT scan    History of mammogram    History of MRI    Hypertension    Pheochromocytoma    Pheochromocytoma    Stroke ([place])    Thyroid disease    Past Surgical History:  Procedure Laterality Date   ABDOMINAL HYSTERECTOMY     CESAREAN SECTION     3   CORONARY ARTERY BYPASS GRAFT     Right adrenal gland removal for pheochromocytoma Right     Allergies  Allergen Reactions   Sumatriptan Anaphylaxis   Contrast Media [Iodinated Contrast  Media] Swelling   Penicillins Swelling    Mouth swells up and eyes swollen shut    Outpatient Encounter Medications as of 10/31/2021  Medication Sig   acetaminophen (TYLENOL) 325 MG tablet Take 2 tablets (650 mg total) by mouth every 6 (six) hours as needed for mild pain or headache.   atorvastatin (LIPITOR) 80 MG tablet Take 80 mg by mouth daily.   blood glucose meter kit and supplies KIT Dispense based on patient and insurance preference. Use up to four times daily as directed.   cephALEXin (KEFLEX) 500 MG capsule Take 1 capsule (500 mg total) by mouth 3 (three) times daily.   clopidogrel (PLAVIX) 75 MG tablet TAKE 1 TABLET BY MOUTH ONCE DAILY   Continuous Blood Gluc Receiver (DEXCOM G5 RECEIVER KIT) DEVI 1 Device by Does not apply route 4 (four) times daily.   diclofenac Sodium (VOLTAREN) 1 % GEL Apply 4 g topically 4 (four) times daily.   empagliflozin (JARDIANCE) 10 MG TABS tablet Take 1 tablet (10 mg total) by mouth daily before breakfast.   escitalopram (LEXAPRO) 20 MG tablet TAKE 1 TABLET BY MOUTH EVERY MORNING   ezetimibe (ZETIA) 10 MG tablet Take 1 tablet (10 mg total) by mouth daily.   gabapentin (NEURONTIN) 100 MG capsule TAKE 1 CAPSULE BY MOUTH THREE TIMES DAILY (BREAKFAST, LUNCH, BEDTIME)   hydrOXYzine (ATARAX) 25 MG tablet Take 1 tablet (25 mg total) by mouth at bedtime as needed for anxiety.   Insulin Aspart FlexPen (NOVOLOG) 100 UNIT/ML ADMINISTER 4 UNITS UNDER THE  SKIN THREE TIMES DAILY WITH MEALS   insulin glargine (LANTUS SOLOSTAR) 100 UNIT/ML Solostar Pen Inject 35 Units into the skin at bedtime.   Insulin Pen Needle 32G X 4 MM MISC Use to inject insulin 4 times daily as directed.   ipratropium-albuterol (DUONEB) 0.5-2.5 (3) MG/3ML SOLN Take 3 mLs by nebulization 2 (two) times daily.   lip balm (CARMEX) ointment Apply topically as needed for lip care.   lisinopril (ZESTRIL) 20 MG tablet Take 20 mg by mouth daily.   LORazepam (ATIVAN) 0.5 MG tablet TAKE 1 TABLET BY MOUTH  AT BEDTIME   melatonin 3 MG TABS tablet Take 1 tablet (3 mg total) by mouth at bedtime.   methimazole (TAPAZOLE) 5 MG tablet TAKE 1 TABLET BY MOUTH EVERY MORNING   metoprolol tartrate (LOPRESSOR) 100 MG tablet TAKE 1 TABLET BY MOUTH TWICE DAILY (BREAKFAST, BEDTIME)   nystatin cream (MYCOSTATIN) Apply 1 Application topically 2 (two) times daily. Affected areas on breast fold and groin and sacral areas   pantoprazole (PROTONIX) 40 MG tablet TAKE 1 TABLET BY MOUTH EVERY MORNING   polyethylene glycol powder (GLYCOLAX/MIRALAX) 17 GM/SCOOP powder Take 17 g by mouth 2 (two) times daily.   senna (SENOKOT) 8.6 MG tablet Take 1 tablet by mouth as needed for constipation.   tamsulosin (FLOMAX) 0.4 MG CAPS capsule TAKE 1 CAPSULE BY MOUTH AT BEDTIME   topiramate (TOPAMAX) 25 MG tablet TAKE 1 TABLET(25 MG) BY MOUTH TWICE DAILY   zinc oxide (BALMEX) 11.3 % CREA cream Apply 1 Application topically 2 (two) times daily.   [DISCONTINUED] albuterol (PROVENTIL) (2.5 MG/3ML) 0.083% nebulizer solution Take 3 mLs (2.5 mg total) by nebulization every 6 (six) hours as needed for wheezing or shortness of breath.   [DISCONTINUED] budesonide (PULMICORT) 0.25 MG/2ML nebulizer solution Take 2 mLs (0.25 mg total) by nebulization 2 (two) times daily.   albuterol (PROVENTIL) (2.5 MG/3ML) 0.083% nebulizer solution Take 3 mLs (2.5 mg total) by nebulization every 6 (six) hours as needed for wheezing or shortness of breath.   budesonide (PULMICORT) 0.25 MG/2ML nebulizer solution Take 2 mLs (0.25 mg total) by nebulization 2 (two) times daily.   No facility-administered encounter medications on file as of 10/31/2021.    Review of Systems  Constitutional:  Negative for appetite change, chills, fatigue, fever and unexpected weight change.  HENT:  Negative for congestion, ear discharge, ear pain, facial swelling, hearing loss, nosebleeds, postnasal drip, rhinorrhea, sinus pressure, sinus pain, sneezing, sore throat, tinnitus and trouble  swallowing.   Eyes:  Negative for pain, discharge, redness, itching and visual disturbance.  Respiratory:  Negative for cough, chest tightness, shortness of breath and wheezing.   Cardiovascular:  Negative for chest pain, palpitations and leg swelling.  Gastrointestinal:  Negative for abdominal distention, abdominal pain, blood in stool, constipation, diarrhea, nausea and vomiting.  Endocrine: Negative for cold intolerance, heat intolerance, polydipsia, polyphagia and polyuria.  Genitourinary:  Negative for difficulty urinating, dysuria, flank pain and urgency.       Incontinent  Musculoskeletal:  Positive for gait problem. Negative for arthralgias, back pain, joint swelling, myalgias, neck pain and neck stiffness.  Skin:  Negative for color change, pallor, rash and wound.  Neurological:  Negative for dizziness, syncope, speech difficulty, weakness, light-headedness, numbness and headaches.  Hematological:  Does not bruise/bleed easily.  Psychiatric/Behavioral:  Negative for agitation, behavioral problems, confusion, hallucinations, self-injury, sleep disturbance and suicidal ideas. The patient is not nervous/anxious.      There is no immunization history on file for  this patient. Pertinent  Health Maintenance Due  Topic Date Due   FOOT EXAM  Never done   OPHTHALMOLOGY EXAM  Never done   COLONOSCOPY (Pts 45-68yr Insurance coverage will need to be confirmed)  Never done   INFLUENZA VACCINE  Never done   HEMOGLOBIN A1C  04/23/2022   PAP SMEAR-Modifier  Discontinued      08/29/2021    7:37 AM 08/29/2021    8:47 PM 10/16/2021   12:24 PM 10/22/2021    3:32 PM 10/31/2021    1:20 PM  Fall Risk  Falls in the past year?    0 0  Was there an injury with Fall?    0 0  Fall Risk Category Calculator    0 0  Fall Risk Category    Low Low  Patient Fall Risk Level Moderate fall risk Moderate fall risk Low fall risk Low fall risk Low fall risk  Patient at Risk for Falls Due to    No Fall Risks No  Fall Risks  Fall risk Follow up    Falls evaluation completed Falls evaluation completed   Functional Status Survey:    Vitals:   10/31/21 1320  BP: (!) 140/88  Pulse: 83  Resp: 16  Temp: (!) 96.7 F (35.9 C)  SpO2: 96%  Height: _0  (1.575 m)   Body mass index is 34.97 kg/m. Physical Exam Vitals reviewed.  Constitutional:      General: She is not in acute distress.    Appearance: Normal appearance. She is obese. She is not ill-appearing or diaphoretic.  HENT:     Head: Normocephalic.     Right Ear: Tympanic membrane, ear canal and external ear normal. There is no impacted cerumen.     Left Ear: Tympanic membrane, ear canal and external ear normal. There is no impacted cerumen.     Nose: Nose normal. No congestion or rhinorrhea.     Mouth/Throat:     Mouth: Mucous membranes are moist.     Pharynx: Oropharynx is clear. No oropharyngeal exudate or posterior oropharyngeal erythema.  Eyes:     General: No scleral icterus.       Right eye: No discharge.        Left eye: No discharge.     Extraocular Movements: Extraocular movements intact.     Conjunctiva/sclera: Conjunctivae normal.     Pupils: Pupils are equal, round, and reactive to light.  Neck:     Vascular: No carotid bruit.  Cardiovascular:     Rate and Rhythm: Normal rate and regular rhythm.     Pulses: Normal pulses.     Heart sounds: Normal heart sounds. No murmur heard.    No friction rub. No gallop.  Pulmonary:     Effort: Pulmonary effort is normal. No respiratory distress.     Breath sounds: Normal breath sounds. No wheezing, rhonchi or rales.  Chest:     Chest wall: No tenderness.  Abdominal:     General: Bowel sounds are normal. There is no distension.     Palpations: Abdomen is soft. There is no mass.     Tenderness: There is no abdominal tenderness. There is no right CVA tenderness, left CVA tenderness, guarding or rebound.  Musculoskeletal:        General: No swelling or tenderness. Normal range  of motion.     Cervical back: Normal range of motion. No rigidity or tenderness.     Right lower leg: No edema.  Left lower leg: No edema.  Lymphadenopathy:     Cervical: No cervical adenopathy.  Skin:    General: Skin is warm and dry.     Coloration: Skin is not pale.     Findings: No bruising, erythema, lesion or rash.  Neurological:     Mental Status: She is alert and oriented to person, place, and time.     Gait: Gait abnormal.     Comments: Left hand contrature  Psychiatric:        Mood and Affect: Mood normal.        Speech: Speech normal.        Behavior: Behavior normal.     Labs reviewed: Recent Labs    02/21/21 0407 02/22/21 0306 08/25/21 0518 08/26/21 0616 08/28/21 0529 08/29/21 0823 10/16/21 1634 10/23/21 1137  NA 134*   < > 134* 140 136 135 138 138  K 3.5   < > 3.7 3.3* 4.7 4.4 4.8 4.4  CL 99   < > 97* 103 106 105 107 106  CO2 27   < > _0 21* 21* 19*  GLUCOSE 215*   < > 386* 144* 228* 294* 148* 149*  BUN 7   < > _1 CREATININE 0.59   < > 0.64 0.73 0.69 0.58 1.20* 0.81  CALCIUM 8.6*   < > 8.9 9.0 9.0 8.7* 9.3 9.4  MG 1.7   < > 1.6* 1.9 1.9  --   --   --   PHOS 4.3  --   --   --   --   --   --   --    < > = values in this interval not displayed.   Recent Labs    06/11/21 1500 07/10/21 1233 10/16/21 1634  AST 17 13* 18  ALT _2 ALKPHOS 124 116 73  BILITOT 0.8 1.2 1.0  PROT 8.5* 8.0 7.7  ALBUMIN 4.1 3.5 3.6   Recent Labs    08/24/21 1416 08/25/21 0518 08/29/21 0823 10/16/21 1634 10/23/21 1137  WBC 8.9   < > 9.3 9.7 9.0  NEUTROABS 6.1  --   --  4.7 5,472  HGB 14.0   < > 12.3 14.0 14.1  HCT 42.6   < > 37.5 44.3 43.8  MCV 78.3*   < > 80.5 81.4 80.1  PLT 366   < > 309 450* 503*   < > = values in this interval not displayed.   Lab Results  Component Value Date   TSH 0.93 10/23/2021   Lab Results  Component Value Date   HGBA1C 10.5 (H) 10/23/2021   Lab Results  Component Value Date   CHOL 142 10/23/2021    HDL 51 10/23/2021   LDLCALC 70 10/23/2021   TRIG 127 10/23/2021   CHOLHDL 2.8 10/23/2021    Significant Diagnostic Results in last 30 days:  DG Chest Port 1 View  Result Date: 10/16/2021 CLINICAL DATA:  Persistent cough for 2 weeks EXAM: PORTABLE CHEST 1 VIEW COMPARISON:  08/24/2021 FINDINGS: Previous median sternotomy noted. Normal heart size and vascularity. Azygos fissure in the right upper lobe, normal variant. No focal pneumonia, collapse or consolidation. Negative for edema, effusion or pneumothorax. Trachea midline. IMPRESSION: No active disease. Electronically Signed   By: Jerilynn Mages.  Shick M.D.   On: 10/16/2021 14:12    Assessment/Plan  Mild intermittent asthma without complication Afebrile  Breathing stable today  - continue on Albuterol and Budesonide  -  refer to pulmonology on recent visit still awaiting appointment - albuterol (PROVENTIL) (2.5 MG/3ML) 0.083% nebulizer solution; Take 3 mLs (2.5 mg total) by nebulization every 6 (six) hours as needed for wheezing or shortness of breath.  Dispense: 75 mL; Refill: 12 - budesonide (PULMICORT) 0.25 MG/2ML nebulizer solution; Take 2 mLs (0.25 mg total) by nebulization 2 (two) times daily.  Dispense: 60 mL; Refill: 12  Family/ staff Communication: Reviewed plan of care with patient and husband verbalized understanding  Labs/tests ordered: None   Next Appointment: Return if symptoms worsen or fail to improve.   [name], NP

## 2021-10-31 NOTE — Telephone Encounter (Signed)
Patient was advised.  

## 2021-11-01 ENCOUNTER — Telehealth: Payer: Self-pay

## 2021-11-01 ENCOUNTER — Other Ambulatory Visit (HOSPITAL_COMMUNITY): Payer: Self-pay

## 2021-11-01 NOTE — Telephone Encounter (Signed)
Incoming fax received via covermymeds to initiate a PA for budesonide (nebulizer solution).   I attempted to complete PA and received the message below:    I called patient to verify insurance. Patient confirmed that she has medicaid only. I called the number [phone] listed on the document scanned as insurance information and it was not a valid number.   I then called the pharmacy to confirm that they are running rx through the correct insurance. The pharmacy said they are running rx under medicare, as that is all they have on file for the patient.  I called the patient called and informed her that she needs to take a copy of her medicaid card to the pharmacy and she stated she has no way to do so and asked that I fax it to them. Patient then confirmed that she has medicaid and medicare  I faxed insurance info to the pharmacy as requested. Once the pharmacy receives info they can process rx through medicaid.

## 2021-11-02 ENCOUNTER — Encounter (HOSPITAL_COMMUNITY): Payer: Self-pay

## 2021-11-02 ENCOUNTER — Emergency Department (HOSPITAL_COMMUNITY): Payer: Medicare Other

## 2021-11-02 ENCOUNTER — Inpatient Hospital Stay (HOSPITAL_COMMUNITY)
Admission: EM | Admit: 2021-11-02 | Discharge: 2021-11-08 | DRG: 204 | Disposition: A | Discharge status: Home Health | Payer: Medicare Other | Attending: Internal Medicine | Admitting: Internal Medicine

## 2021-11-02 ENCOUNTER — Other Ambulatory Visit: Payer: Self-pay

## 2021-11-02 ENCOUNTER — Observation Stay (HOSPITAL_COMMUNITY): Payer: Medicare Other

## 2021-11-02 DIAGNOSIS — I160 Hypertensive urgency: Secondary | ICD-10-CM | POA: Diagnosis present

## 2021-11-02 DIAGNOSIS — E896 Postprocedural adrenocortical (-medullary) hypofunction: Secondary | ICD-10-CM | POA: Diagnosis present

## 2021-11-02 DIAGNOSIS — Z79899 Other long term (current) drug therapy: Secondary | ICD-10-CM

## 2021-11-02 DIAGNOSIS — F32A Depression, unspecified: Secondary | ICD-10-CM | POA: Diagnosis present

## 2021-11-02 DIAGNOSIS — E039 Hypothyroidism, unspecified: Secondary | ICD-10-CM | POA: Diagnosis present

## 2021-11-02 DIAGNOSIS — E109 Type 1 diabetes mellitus without complications: Secondary | ICD-10-CM

## 2021-11-02 DIAGNOSIS — E119 Type 2 diabetes mellitus without complications: Secondary | ICD-10-CM | POA: Diagnosis present

## 2021-11-02 DIAGNOSIS — R0600 Dyspnea, unspecified: Secondary | ICD-10-CM | POA: Diagnosis not present

## 2021-11-02 DIAGNOSIS — Z7984 Long term (current) use of oral hypoglycemic drugs: Secondary | ICD-10-CM

## 2021-11-02 DIAGNOSIS — R051 Acute cough: Principal | ICD-10-CM

## 2021-11-02 DIAGNOSIS — Z794 Long term (current) use of insulin: Secondary | ICD-10-CM

## 2021-11-02 DIAGNOSIS — E0590 Thyrotoxicosis, unspecified without thyrotoxic crisis or storm: Secondary | ICD-10-CM | POA: Diagnosis present

## 2021-11-02 DIAGNOSIS — Z86018 Personal history of other benign neoplasm: Secondary | ICD-10-CM

## 2021-11-02 DIAGNOSIS — R000 Tachycardia, unspecified: Secondary | ICD-10-CM | POA: Diagnosis present

## 2021-11-02 DIAGNOSIS — Z20822 Contact with and (suspected) exposure to covid-19: Secondary | ICD-10-CM | POA: Diagnosis present

## 2021-11-02 DIAGNOSIS — I69354 Hemiplegia and hemiparesis following cerebral infarction affecting left non-dominant side: Secondary | ICD-10-CM

## 2021-11-02 DIAGNOSIS — Z90710 Acquired absence of both cervix and uterus: Secondary | ICD-10-CM

## 2021-11-02 DIAGNOSIS — F419 Anxiety disorder, unspecified: Secondary | ICD-10-CM | POA: Diagnosis present

## 2021-11-02 DIAGNOSIS — R0602 Shortness of breath: Secondary | ICD-10-CM | POA: Diagnosis not present

## 2021-11-02 DIAGNOSIS — B3749 Other urogenital candidiasis: Secondary | ICD-10-CM | POA: Diagnosis present

## 2021-11-02 DIAGNOSIS — Z91041 Radiographic dye allergy status: Secondary | ICD-10-CM

## 2021-11-02 DIAGNOSIS — R059 Cough, unspecified: Secondary | ICD-10-CM

## 2021-11-02 DIAGNOSIS — Z833 Family history of diabetes mellitus: Secondary | ICD-10-CM

## 2021-11-02 DIAGNOSIS — K219 Gastro-esophageal reflux disease without esophagitis: Secondary | ICD-10-CM

## 2021-11-02 DIAGNOSIS — Z888 Allergy status to other drugs, medicaments and biological substances status: Secondary | ICD-10-CM

## 2021-11-02 DIAGNOSIS — I10 Essential (primary) hypertension: Secondary | ICD-10-CM | POA: Diagnosis present

## 2021-11-02 DIAGNOSIS — I2510 Atherosclerotic heart disease of native coronary artery without angina pectoris: Secondary | ICD-10-CM | POA: Diagnosis present

## 2021-11-02 DIAGNOSIS — Z880 Allergy status to penicillin: Secondary | ICD-10-CM

## 2021-11-02 DIAGNOSIS — Z951 Presence of aortocoronary bypass graft: Secondary | ICD-10-CM

## 2021-11-02 DIAGNOSIS — I639 Cerebral infarction, unspecified: Secondary | ICD-10-CM | POA: Diagnosis present

## 2021-11-02 DIAGNOSIS — Z8249 Family history of ischemic heart disease and other diseases of the circulatory system: Secondary | ICD-10-CM

## 2021-11-02 DIAGNOSIS — E785 Hyperlipidemia, unspecified: Secondary | ICD-10-CM | POA: Diagnosis present

## 2021-11-02 DIAGNOSIS — Z7902 Long term (current) use of antithrombotics/antiplatelets: Secondary | ICD-10-CM

## 2021-11-02 DIAGNOSIS — Z7951 Long term (current) use of inhaled steroids: Secondary | ICD-10-CM

## 2021-11-02 DIAGNOSIS — E860 Dehydration: Secondary | ICD-10-CM | POA: Diagnosis present

## 2021-11-02 LAB — CBC WITH DIFFERENTIAL/PLATELET
Abs Immature Granulocytes: 0.02 10*3/uL (ref 0.00–0.07)
Basophils Absolute: 0.1 10*3/uL (ref 0.0–0.1)
Basophils Relative: 1 %
Eosinophils Absolute: 0.4 10*3/uL (ref 0.0–0.5)
Eosinophils Relative: 4 %
HCT: 39.2 % (ref 36.0–46.0)
Hemoglobin: 12.6 g/dL (ref 12.0–15.0)
Immature Granulocytes: 0 %
Lymphocytes Relative: 22 %
Lymphs Abs: 2.1 10*3/uL (ref 0.7–4.0)
MCH: 25.5 pg — ABNORMAL LOW (ref 26.0–34.0)
MCHC: 32.1 g/dL (ref 30.0–36.0)
MCV: 79.4 fL — ABNORMAL LOW (ref 80.0–100.0)
Monocytes Absolute: 0.6 10*3/uL (ref 0.1–1.0)
Monocytes Relative: 7 %
Neutro Abs: 6.2 10*3/uL (ref 1.7–7.7)
Neutrophils Relative %: 66 %
Platelets: 361 10*3/uL (ref 150–400)
RBC: 4.94 MIL/uL (ref 3.87–5.11)
RDW: 15.8 % — ABNORMAL HIGH (ref 11.5–15.5)
WBC: 9.4 10*3/uL (ref 4.0–10.5)
nRBC: 0 % (ref 0.0–0.2)

## 2021-11-02 LAB — URINALYSIS, ROUTINE W REFLEX MICROSCOPIC
Bilirubin Urine: NEGATIVE
Glucose, UA: >=500 mg/dL — AB
Ketones, ur: 5 mg/dL — AB
Nitrite: NEGATIVE
Protein, ur: 30 mg/dL — AB
RBC / HPF: >50 RBC/hpf — ABNORMAL HIGH (ref 0–5)
Specific Gravity, Urine: 1.014 (ref 1.005–1.030)
WBC, UA: >50 WBC/hpf — ABNORMAL HIGH (ref 0–5)
pH: 5 (ref 5.0–8.0)

## 2021-11-02 LAB — COMPREHENSIVE METABOLIC PANEL
ALT: 15 U/L (ref 0–44)
ALT: 13 U/L (ref 0–44)
AST: 12 U/L — ABNORMAL LOW (ref 15–41)
AST: 11 U/L — ABNORMAL LOW (ref 15–41)
Albumin: 3.3 g/dL — ABNORMAL LOW (ref 3.5–5.0)
Albumin: 3.6 g/dL (ref 3.5–5.0)
Alkaline Phosphatase: 77 U/L (ref 38–126)
Alkaline Phosphatase: 77 U/L (ref 38–126)
Anion gap: 9 (ref 5–15)
Anion gap: 10 (ref 5–15)
BUN: 9 mg/dL (ref 6–20)
BUN: 11 mg/dL (ref 6–20)
CO2: 25 mmol/L (ref 22–32)
CO2: 20 mmol/L — ABNORMAL LOW (ref 22–32)
Calcium: 8.5 mg/dL — ABNORMAL LOW (ref 8.9–10.3)
Calcium: 8.7 mg/dL — ABNORMAL LOW (ref 8.9–10.3)
Chloride: 106 mmol/L (ref 98–111)
Chloride: 107 mmol/L (ref 98–111)
Creatinine, Ser: 0.68 mg/dL (ref 0.44–1.00)
Creatinine, Ser: 0.68 mg/dL (ref 0.44–1.00)
GFR, Estimated: >60 mL/min (ref >60)
GFR, Estimated: >60 mL/min (ref >60)
Glucose, Bld: 210 mg/dL — ABNORMAL HIGH (ref 70–99)
Glucose, Bld: 213 mg/dL — ABNORMAL HIGH (ref 70–99)
Potassium: 3.6 mmol/L (ref 3.5–5.1)
Potassium: 3.7 mmol/L (ref 3.5–5.1)
Sodium: 140 mmol/L (ref 135–145)
Sodium: 137 mmol/L (ref 135–145)
Total Bilirubin: 0.5 mg/dL (ref 0.3–1.2)
Total Bilirubin: 1 mg/dL (ref 0.3–1.2)
Total Protein: 6.9 g/dL (ref 6.5–8.1)
Total Protein: 7.5 g/dL (ref 6.5–8.1)

## 2021-11-02 LAB — RESPIRATORY PANEL BY PCR

## 2021-11-02 LAB — RESP PANEL BY RT-PCR (FLU A&B, COVID) ARPGX2
Influenza A by PCR: NEGATIVE
Influenza B by PCR: NEGATIVE
SARS Coronavirus 2 by RT PCR: NEGATIVE

## 2021-11-02 LAB — PROCALCITONIN: Procalcitonin: <0.1 ng/mL

## 2021-11-02 LAB — BLOOD GAS, VENOUS
Acid-base deficit: 1.1 mmol/L (ref 0.0–2.0)
Bicarbonate: 24.8 mmol/L (ref 20.0–28.0)
O2 Saturation: 55.5 %
Patient temperature: 37
pCO2, Ven: 45 mmHg (ref 44–60)
pH, Ven: 7.35 (ref 7.25–7.43)
pO2, Ven: 37 mmHg (ref 32–45)

## 2021-11-02 LAB — CBG MONITORING, ED
Glucose-Capillary: 213 mg/dL — ABNORMAL HIGH (ref 70–99)
Glucose-Capillary: 304 mg/dL — ABNORMAL HIGH (ref 70–99)

## 2021-11-02 LAB — D-DIMER, QUANTITATIVE: D-Dimer, Quant: 0.41 ug/mL-FEU (ref 0.00–0.50)

## 2021-11-02 LAB — CBC
HCT: 39 % (ref 36.0–46.0)
Hemoglobin: 12.9 g/dL (ref 12.0–15.0)
MCH: 25.9 pg — ABNORMAL LOW (ref 26.0–34.0)
MCHC: 33.1 g/dL (ref 30.0–36.0)
MCV: 78.2 fL — ABNORMAL LOW (ref 80.0–100.0)
Platelets: 323 10*3/uL (ref 150–400)
RBC: 4.99 MIL/uL (ref 3.87–5.11)
RDW: 15.7 % — ABNORMAL HIGH (ref 11.5–15.5)
WBC: 13.2 10*3/uL — ABNORMAL HIGH (ref 4.0–10.5)
nRBC: 0 % (ref 0.0–0.2)

## 2021-11-02 LAB — MAGNESIUM: Magnesium: 1.7 mg/dL (ref 1.7–2.4)

## 2021-11-02 LAB — T4, FREE: Free T4: 1.32 ng/dL — ABNORMAL HIGH (ref 0.61–1.12)

## 2021-11-02 LAB — TSH: TSH: 1.399 u[IU]/mL (ref 0.350–4.500)

## 2021-11-02 LAB — GLUCOSE, CAPILLARY
Glucose-Capillary: 207 mg/dL — ABNORMAL HIGH (ref 70–99)
Glucose-Capillary: 229 mg/dL — ABNORMAL HIGH (ref 70–99)
Glucose-Capillary: 184 mg/dL — ABNORMAL HIGH (ref 70–99)

## 2021-11-02 MED ORDER — ACETAMINOPHEN 325 MG PO TABS
650.0000 mg | ORAL_TABLET | Freq: Four times a day (QID) | ORAL | Status: DC | PRN
  Administered 2021-11-06: 650 mg via ORAL
  Filled 2021-11-02 (×2): qty 2

## 2021-11-02 MED ORDER — IPRATROPIUM BROMIDE 0.02 % IN SOLN
0.5000 mg | Freq: Four times a day (QID) | RESPIRATORY_TRACT | Status: DC
  Administered 2021-11-02: 0.5 mg via RESPIRATORY_TRACT
  Filled 2021-11-02: qty 2.5

## 2021-11-02 MED ORDER — METHIMAZOLE 5 MG PO TABS
5.0000 mg | ORAL_TABLET | Freq: Every day | ORAL | Status: DC
  Administered 2021-11-03 – 2021-11-07 (×5): 5 mg via ORAL
  Filled 2021-11-02 (×6): qty 1

## 2021-11-02 MED ORDER — LABETALOL HCL 5 MG/ML IV SOLN: 20.0000 mg | Freq: Once | INTRAVENOUS | Status: AC

## 2021-11-02 MED ORDER — METHYLPREDNISOLONE SODIUM SUCC 40 MG IJ SOLR
40.0000 mg | Freq: Two times a day (BID) | INTRAMUSCULAR | Status: AC
  Administered 2021-11-02 – 2021-11-03 (×2): 40 mg via INTRAVENOUS
  Filled 2021-11-02 (×2): qty 1

## 2021-11-02 MED ORDER — GABAPENTIN 100 MG PO CAPS
100.0000 mg | ORAL_CAPSULE | Freq: Three times a day (TID) | ORAL | Status: DC
  Administered 2021-11-03 – 2021-11-07 (×15): 100 mg via ORAL
  Filled 2021-11-02 (×16): qty 1

## 2021-11-02 MED ORDER — EZETIMIBE 10 MG PO TABS
10.0000 mg | ORAL_TABLET | Freq: Every day | ORAL | Status: DC
  Administered 2021-11-03 – 2021-11-07 (×5): 10 mg via ORAL
  Filled 2021-11-02 (×6): qty 1

## 2021-11-02 MED ORDER — INSULIN ASPART 100 UNIT/ML IJ SOLN
0.0000 [IU] | Freq: Every day | INTRAMUSCULAR | Status: DC
  Filled 2021-11-02: qty 0.05

## 2021-11-02 MED ORDER — PANTOPRAZOLE SODIUM 40 MG PO TBEC
40.0000 mg | DELAYED_RELEASE_TABLET | Freq: Every day | ORAL | Status: DC
  Administered 2021-11-02: 40 mg via ORAL
  Filled 2021-11-02: qty 1

## 2021-11-02 MED ORDER — SODIUM CHLORIDE 0.9 % IV BOLUS
1000.0000 mL | Freq: Once | INTRAVENOUS | Status: AC
  Administered 2021-11-02: 1000 mL via INTRAVENOUS

## 2021-11-02 MED ORDER — ESCITALOPRAM OXALATE 20 MG PO TABS
20.0000 mg | ORAL_TABLET | Freq: Every day | ORAL | Status: DC
  Administered 2021-11-03 – 2021-11-07 (×5): 20 mg via ORAL
  Filled 2021-11-02 (×6): qty 1

## 2021-11-02 MED ORDER — INSULIN ASPART 100 UNIT/ML IJ SOLN
0.0000 [IU] | Freq: Three times a day (TID) | INTRAMUSCULAR | Status: DC
  Administered 2021-11-02: 5 [IU] via SUBCUTANEOUS
  Administered 2021-11-02: 8 [IU] via SUBCUTANEOUS
  Filled 2021-11-02: qty 0.15

## 2021-11-02 MED ORDER — ALUM & MAG HYDROXIDE-SIMETH 200-200-20 MG/5ML PO SUSP
30.0000 mL | ORAL | Status: DC | PRN
  Administered 2021-11-02 – 2021-11-04 (×2): 30 mL via ORAL
  Filled 2021-11-02 (×2): qty 30

## 2021-11-02 MED ORDER — CLOPIDOGREL BISULFATE 75 MG PO TABS
75.0000 mg | ORAL_TABLET | Freq: Every day | ORAL | Status: DC
  Administered 2021-11-03 – 2021-11-07 (×5): 75 mg via ORAL
  Filled 2021-11-02 (×6): qty 1

## 2021-11-02 MED ORDER — LABETALOL HCL 5 MG/ML IV SOLN
INTRAVENOUS | Status: AC
  Administered 2021-11-02: 20 mg
  Filled 2021-11-02: qty 4

## 2021-11-02 MED ORDER — IPRATROPIUM-ALBUTEROL 0.5-2.5 (3) MG/3ML IN SOLN
3.0000 mL | Freq: Once | RESPIRATORY_TRACT | Status: AC
  Administered 2021-11-02: 3 mL via RESPIRATORY_TRACT
  Filled 2021-11-02: qty 3

## 2021-11-02 MED ORDER — LABETALOL HCL 5 MG/ML IV SOLN: 20.0000 mg | Freq: Once | INTRAVENOUS | Status: DC

## 2021-11-02 MED ORDER — MELATONIN 5 MG PO TABS
5.0000 mg | ORAL_TABLET | Freq: Every day | ORAL | Status: DC
  Administered 2021-11-04 – 2021-11-07 (×4): 5 mg via ORAL
  Filled 2021-11-02 (×5): qty 1

## 2021-11-02 MED ORDER — ACETAMINOPHEN 650 MG RE SUPP: 650.0000 mg | Freq: Four times a day (QID) | RECTAL | Status: DC | PRN

## 2021-11-02 MED ORDER — METHYLPREDNISOLONE SODIUM SUCC 125 MG IJ SOLR
125.0000 mg | Freq: Once | INTRAMUSCULAR | Status: AC
  Administered 2021-11-02: 125 mg via INTRAVENOUS
  Filled 2021-11-02: qty 2

## 2021-11-02 MED ORDER — LEVALBUTEROL HCL 0.63 MG/3ML IN NEBU
0.6300 mg | INHALATION_SOLUTION | Freq: Four times a day (QID) | RESPIRATORY_TRACT | Status: DC
  Administered 2021-11-02: 0.63 mg via RESPIRATORY_TRACT
  Filled 2021-11-02: qty 3

## 2021-11-02 MED ORDER — PROCHLORPERAZINE EDISYLATE 10 MG/2ML IJ SOLN
INTRAMUSCULAR | Status: AC
  Filled 2021-11-02: qty 2

## 2021-11-02 MED ORDER — LEVALBUTEROL HCL 0.63 MG/3ML IN NEBU
0.6300 mg | INHALATION_SOLUTION | Freq: Three times a day (TID) | RESPIRATORY_TRACT | Status: DC
  Administered 2021-11-03: 0.63 mg via RESPIRATORY_TRACT
  Filled 2021-11-02 (×2): qty 3

## 2021-11-02 MED ORDER — METOPROLOL TARTRATE 50 MG PO TABS
100.0000 mg | ORAL_TABLET | Freq: Two times a day (BID) | ORAL | Status: DC
  Administered 2021-11-02 – 2021-11-07 (×11): 100 mg via ORAL
  Filled 2021-11-02: qty 2
  Filled 2021-11-02: qty 4
  Filled 2021-11-02 (×2): qty 2
  Filled 2021-11-02 (×3): qty 4
  Filled 2021-11-02: qty 2
  Filled 2021-11-02 (×3): qty 4
  Filled 2021-11-02: qty 2

## 2021-11-02 MED ORDER — LORAZEPAM 2 MG/ML IJ SOLN
0.5000 mg | Freq: Three times a day (TID) | INTRAMUSCULAR | Status: DC | PRN
  Administered 2021-11-02 – 2021-11-05 (×4): 0.5 mg via INTRAVENOUS
  Filled 2021-11-02 (×4): qty 1

## 2021-11-02 MED ORDER — DIPHENHYDRAMINE HCL 25 MG PO CAPS
25.0000 mg | ORAL_CAPSULE | Freq: Three times a day (TID) | ORAL | Status: DC | PRN
  Administered 2021-11-04 – 2021-11-05 (×2): 25 mg via ORAL
  Filled 2021-11-02 (×2): qty 1

## 2021-11-02 MED ORDER — TAMSULOSIN HCL 0.4 MG PO CAPS
0.4000 mg | ORAL_CAPSULE | Freq: Every day | ORAL | Status: DC
  Administered 2021-11-03 – 2021-11-07 (×5): 0.4 mg via ORAL
  Filled 2021-11-02 (×6): qty 1

## 2021-11-02 MED ORDER — LEVOFLOXACIN IN D5W 750 MG/150ML IV SOLN: 750.0000 mg | INTRAVENOUS | Status: DC

## 2021-11-02 MED ORDER — METOPROLOL TARTRATE 5 MG/5ML IV SOLN
5.0000 mg | Freq: Four times a day (QID) | INTRAVENOUS | Status: DC | PRN
  Administered 2021-11-02 – 2021-11-04 (×4): 5 mg via INTRAVENOUS
  Filled 2021-11-02 (×4): qty 5

## 2021-11-02 MED ORDER — PREDNISONE 20 MG PO TABS
40.0000 mg | ORAL_TABLET | Freq: Every day | ORAL | Status: AC
  Administered 2021-11-03 – 2021-11-06 (×4): 40 mg via ORAL
  Filled 2021-11-02 (×4): qty 2

## 2021-11-02 MED ORDER — HYDROCODONE BIT-HOMATROP MBR 5-1.5 MG/5ML PO SOLN
5.0000 mL | Freq: Four times a day (QID) | ORAL | Status: DC | PRN
  Administered 2021-11-02: 5 mL via ORAL
  Filled 2021-11-02: qty 5

## 2021-11-02 MED ORDER — HYDRALAZINE HCL 20 MG/ML IJ SOLN
INTRAMUSCULAR | Status: AC
  Administered 2021-11-02: 10 mg
  Filled 2021-11-02: qty 1

## 2021-11-02 MED ORDER — IPRATROPIUM BROMIDE 0.02 % IN SOLN
0.5000 mg | Freq: Three times a day (TID) | RESPIRATORY_TRACT | Status: DC
  Administered 2021-11-03: 0.5 mg via RESPIRATORY_TRACT
  Filled 2021-11-02 (×2): qty 2.5

## 2021-11-02 MED ORDER — ACETAMINOPHEN 10 MG/ML IV SOLN
1000.0000 mg | Freq: Once | INTRAVENOUS | Status: AC
  Administered 2021-11-02: 1000 mg via INTRAVENOUS
  Filled 2021-11-02: qty 100

## 2021-11-02 MED ORDER — KETOROLAC TROMETHAMINE 15 MG/ML IJ SOLN
15.0000 mg | Freq: Once | INTRAMUSCULAR | Status: AC
  Administered 2021-11-02: 15 mg via INTRAVENOUS
  Filled 2021-11-02: qty 1

## 2021-11-02 MED ORDER — HYDRALAZINE HCL 20 MG/ML IJ SOLN: 10.0000 mg | Freq: Once | INTRAMUSCULAR | Status: DC

## 2021-11-02 MED ORDER — ONDANSETRON HCL 4 MG/2ML IJ SOLN
4.0000 mg | Freq: Four times a day (QID) | INTRAMUSCULAR | Status: DC | PRN
  Administered 2021-11-02 – 2021-11-03 (×4): 4 mg via INTRAVENOUS
  Filled 2021-11-02 (×4): qty 2

## 2021-11-02 MED ORDER — NICARDIPINE HCL IN NACL 20-0.86 MG/200ML-% IV SOLN
3.0000 mg/h | INTRAVENOUS | Status: DC
  Administered 2021-11-02 (×2): 5 mg/h via INTRAVENOUS
  Administered 2021-11-03: 10 mg/h via INTRAVENOUS
  Administered 2021-11-03 (×2): 7.5 mg/h via INTRAVENOUS
  Administered 2021-11-03: 10 mg/h via INTRAVENOUS
  Filled 2021-11-02 (×8): qty 200

## 2021-11-02 MED ORDER — ENOXAPARIN SODIUM 40 MG/0.4ML IJ SOSY
40.0000 mg | PREFILLED_SYRINGE | Freq: Every day | INTRAMUSCULAR | Status: DC
  Administered 2021-11-04 – 2021-11-07 (×3): 40 mg via SUBCUTANEOUS
  Filled 2021-11-02 (×6): qty 0.4

## 2021-11-02 MED ORDER — CHLORHEXIDINE GLUCONATE CLOTH 2 % EX PADS
6.0000 | MEDICATED_PAD | Freq: Every day | CUTANEOUS | Status: DC
  Administered 2021-11-03 – 2021-11-07 (×4): 6 via TOPICAL

## 2021-11-02 MED ORDER — LEVOFLOXACIN IN D5W 750 MG/150ML IV SOLN
750.0000 mg | Freq: Once | INTRAVENOUS | Status: AC
  Administered 2021-11-02: 750 mg via INTRAVENOUS
  Filled 2021-11-02: qty 150

## 2021-11-02 MED ORDER — PROCHLORPERAZINE EDISYLATE 10 MG/2ML IJ SOLN
10.0000 mg | Freq: Four times a day (QID) | INTRAMUSCULAR | Status: DC | PRN
  Administered 2021-11-02 – 2021-11-03 (×3): 10 mg via INTRAVENOUS
  Filled 2021-11-02 (×2): qty 2

## 2021-11-02 MED ORDER — TOPIRAMATE 25 MG PO TABS
25.0000 mg | ORAL_TABLET | Freq: Two times a day (BID) | ORAL | Status: DC
  Administered 2021-11-03 – 2021-11-04 (×3): 25 mg via ORAL
  Filled 2021-11-02 (×3): qty 1

## 2021-11-02 MED ORDER — BENZONATATE 100 MG PO CAPS
100.0000 mg | ORAL_CAPSULE | Freq: Three times a day (TID) | ORAL | Status: DC
  Administered 2021-11-02: 100 mg via ORAL
  Filled 2021-11-02 (×2): qty 1

## 2021-11-02 MED ORDER — ATORVASTATIN CALCIUM 40 MG PO TABS
80.0000 mg | ORAL_TABLET | Freq: Every day | ORAL | Status: DC
  Administered 2021-11-03 – 2021-11-07 (×5): 80 mg via ORAL
  Filled 2021-11-02 (×6): qty 2

## 2021-11-02 NOTE — ED Provider Notes (Signed)
[facility] DEPT Provider Note  CSN: [id number] Arrival date & time: 11/02/21 0058  Chief Complaint(s) Respiratory Distress  HPI Lori [name] is a 51 y.o. female with a past medical history listed below including hypertension, diabetes, CVA with left-sided deficits, bronchitis who presents to the emergency department with 1 week of gradually worsening shortness of breath and cough.  She denies any fevers or chills.  No lower extremity edema.  No fevers or chills.  No chest pain.  No nausea or vomiting.  No abdominal pain.  No other physical complaints.  The history is provided by the patient.    Past Medical History Past Medical History:  Diagnosis Date   CAD (coronary artery disease)    Depression    Diabetes mellitus without complication ([place])    History of CT scan    History of mammogram    History of MRI    Hypertension    Pheochromocytoma    Pheochromocytoma    Stroke [facility])    Thyroid disease    Patient Active Problem List   Diagnosis Date Noted   Prolonged QT interval 08/25/2021   Nausea and vomiting 08/25/2021   Acute bronchitis 08/24/2021   Hyperosmolar hyperglycemic state (HHS) ([place]) 07/10/2021   Pseudohyponatremia 07/10/2021   Thyroid disease 07/10/2021   Functional urinary incontinence 05/17/2021   MDD (major depressive disorder), recurrent episode, severe ([place]) 04/09/2021   Diabetic gastroparesis ([place]) 03/16/2021   Migraines 03/16/2021   History of cerebral infarction    Debility 02/26/2021   DKA (diabetic ketoacidosis) ([place]) 02/19/2021   Constipation    Weakness    Type 2 diabetes mellitus with hyperlipidemia ([place])    Aphasia    Spell of abnormal behavior    Stroke ([place]) 01/19/2021   Tachycardia 01/19/2021   CAD (coronary artery disease) 01/19/2021   CVA (cerebral vascular accident) ([place]) 01/19/2021   Diabetes ([place])    Hyperthyroidism    Essential hypertension    Pheochromocytoma    Anxiety and depression     HLD (hyperlipidemia)    Home Medication(s) Prior to Admission medications   Medication Sig Start Date End Date Taking? Authorizing Provider  acetaminophen (TYLENOL) 325 MG tablet Take 2 tablets (650 mg total) by mouth every 6 (six) hours as needed for mild pain or headache. 02/26/21   Pokhrel, Corrie Mckusick, MD  albuterol (PROVENTIL) (2.5 MG/3ML) 0.083% nebulizer solution Take 3 mLs (2.5 mg total) by nebulization every 6 (six) hours as needed for wheezing or shortness of breath. 10/31/21   Ngetich, Dinah C, NP  atorvastatin (LIPITOR) 80 MG tablet Take 80 mg by mouth daily. 06/25/21   [provider]  blood glucose meter kit and supplies KIT Dispense based on patient and insurance preference. Use up to four times daily as directed. 03/16/21   Love, Ivan Anchors, PA-C  budesonide (PULMICORT) 0.25 MG/2ML nebulizer solution Take 2 mLs (0.25 mg total) by nebulization 2 (two) times daily. 10/31/21   Ngetich, Dinah C, NP  cephALEXin (KEFLEX) 500 MG capsule Take 1 capsule (500 mg total) by mouth 3 (three) times daily. 10/26/21   Lauree Chandler, NP  clopidogrel (PLAVIX) 75 MG tablet TAKE 1 TABLET BY MOUTH ONCE DAILY 05/23/21   Ngetich, Dinah C, NP  Continuous Blood Gluc Receiver (DEXCOM G5 RECEIVER KIT) DEVI 1 Device by Does not apply route 4 (four) times daily. 10/22/21   Ngetich, Dinah C, NP  diclofenac Sodium (VOLTAREN) 1 % GEL Apply 4 g topically 4 (four) times daily. 03/15/21  Love, Pamela S, PA-C  empagliflozin (JARDIANCE) 10 MG TABS tablet Take 1 tablet (10 mg total) by mouth daily before breakfast. 06/25/21   Early Osmond, MD  escitalopram (LEXAPRO) 20 MG tablet TAKE 1 TABLET BY MOUTH EVERY MORNING 05/23/21   Ngetich, Dinah C, NP  ezetimibe (ZETIA) 10 MG tablet Take 1 tablet (10 mg total) by mouth daily. 05/23/21   Garvin Fila, MD  gabapentin (NEURONTIN) 100 MG capsule TAKE 1 CAPSULE BY MOUTH THREE TIMES DAILY (BREAKFAST, LUNCH, BEDTIME) 05/23/21   Ngetich, Dinah C, NP  hydrOXYzine (ATARAX) 25 MG  tablet Take 1 tablet (25 mg total) by mouth at bedtime as needed for anxiety. 08/30/21   Regalado, Belkys A, MD  Insulin Aspart FlexPen (NOVOLOG) 100 UNIT/ML ADMINISTER 4 UNITS UNDER THE SKIN THREE TIMES DAILY WITH MEALS 07/09/21   Ngetich, Dinah C, NP  insulin glargine (LANTUS SOLOSTAR) 100 UNIT/ML Solostar Pen Inject 35 Units into the skin at bedtime. 08/30/21   Regalado, Belkys A, MD  Insulin Pen Needle 32G X 4 MM MISC Use to inject insulin 4 times daily as directed. 03/15/21   Love, Ivan Anchors, PA-C  ipratropium-albuterol (DUONEB) 0.5-2.5 (3) MG/3ML SOLN Take 3 mLs by nebulization 2 (two) times daily. 10/22/21   Ngetich, Dinah C, NP  lip balm (CARMEX) ointment Apply topically as needed for lip care. 03/16/21   Love, Ivan Anchors, PA-C  lisinopril (ZESTRIL) 20 MG tablet Take 20 mg by mouth daily.    [provider]  LORazepam (ATIVAN) 0.5 MG tablet TAKE 1 TABLET BY MOUTH AT BEDTIME 05/23/21   Ngetich, Dinah C, NP  melatonin 3 MG TABS tablet Take 1 tablet (3 mg total) by mouth at bedtime. 08/30/21   Regalado, Belkys A, MD  methimazole (TAPAZOLE) 5 MG tablet TAKE 1 TABLET BY MOUTH EVERY MORNING 05/23/21   Ngetich, Dinah C, NP  metoprolol tartrate (LOPRESSOR) 100 MG tablet TAKE 1 TABLET BY MOUTH TWICE DAILY (BREAKFAST, BEDTIME) 05/23/21   Ngetich, Dinah C, NP  nystatin cream (MYCOSTATIN) Apply 1 Application topically 2 (two) times daily. Affected areas on breast fold and groin and sacral areas 07/31/21   Ngetich, Dinah C, NP  pantoprazole (PROTONIX) 40 MG tablet TAKE 1 TABLET BY MOUTH EVERY MORNING 05/23/21   Ngetich, Dinah C, NP  polyethylene glycol powder (GLYCOLAX/MIRALAX) 17 GM/SCOOP powder Take 17 g by mouth 2 (two) times daily. 03/15/21   Love, Ivan Anchors, PA-C  senna (SENOKOT) 8.6 MG tablet Take 1 tablet by mouth as needed for constipation.    [provider]  tamsulosin (FLOMAX) 0.4 MG CAPS capsule TAKE 1 CAPSULE BY MOUTH AT BEDTIME 05/23/21   Ngetich, Dinah C, NP  topiramate (TOPAMAX) 25 MG tablet  TAKE 1 TABLET(25 MG) BY MOUTH TWICE DAILY 06/22/21   Ngetich, Dinah C, NP  zinc oxide (BALMEX) 11.3 % CREA cream Apply 1 Application topically 2 (two) times daily. 07/31/21   Ngetich, Nelda Bucks, NP  Allergies Sumatriptan, Contrast media [iodinated contrast media], and Penicillins  Review of Systems Review of Systems As noted in HPI  Physical Exam Vital Signs  I have reviewed the triage vital signs BP 119/80   Pulse (!) 121   Temp 98.4 F (36.9 C) (Oral)   Resp 19   Ht _0  (1.575 m)   Wt 81.6 kg   SpO2 97%   BMI 32.92 kg/m   Physical Exam Vitals reviewed.  Constitutional:      General: She is not in acute distress.    Appearance: She is well-developed. She is not diaphoretic.  HENT:     Head: Normocephalic and atraumatic.     Nose: Nose normal.  Eyes:     General: No scleral icterus.       Right eye: No discharge.        Left eye: No discharge.     Conjunctiva/sclera: Conjunctivae normal.     Pupils: Pupils are equal, round, and reactive to light.  Cardiovascular:     Rate and Rhythm: Regular rhythm. Tachycardia present.     Heart sounds: No murmur heard.    No friction rub. No gallop.  Pulmonary:     Effort: Pulmonary effort is normal. No respiratory distress.     Breath sounds: No stridor. Examination of the left-middle field reveals rales. Examination of the right-lower field reveals rales. Examination of the left-lower field reveals wheezing and rales. Wheezing and rales present.  Abdominal:     General: There is no distension.     Palpations: Abdomen is soft.     Tenderness: There is no abdominal tenderness.  Musculoskeletal:        General: No tenderness.     Cervical back: Normal range of motion and neck supple.  Skin:    General: Skin is warm and dry.     Findings: No erythema or rash.  Neurological:     Mental Status: She is  alert and oriented to person, place, and time.     ED Results and Treatments Labs (all labs ordered are listed, but only abnormal results are displayed) Labs Reviewed  COMPREHENSIVE METABOLIC PANEL - Abnormal; Notable for the following components:      Result Value   Glucose, Bld 210 (*)    Calcium 8.5 (*)    Albumin 3.3 (*)    AST 12 (*)    All other components within normal limits  CBC WITH DIFFERENTIAL/PLATELET - Abnormal; Notable for the following components:   MCV 79.4 (*)    MCH 25.5 (*)    RDW 15.8 (*)    All other components within normal limits  CBG MONITORING, ED - Abnormal; Notable for the following components:   Glucose-Capillary 213 (*)    All other components within normal limits  RESP PANEL BY RT-PCR (FLU A&B, COVID) ARPGX2  BLOOD GAS, VENOUS  D-DIMER, QUANTITATIVE  TSH  URINALYSIS, ROUTINE W REFLEX MICROSCOPIC  T4, FREE  EKG  EKG Interpretation  Date/Time:  Friday November 02 2021 02:08:15 EDT Ventricular Rate:  130 PR Interval:  111 QRS Duration: 82 QT Interval:  315 QTC Calculation: 464 R Axis:   82 Text Interpretation: Sinus tachycardia Nonspecific T abnormalities, diffuse leads Confirmed by Addison Lank [phone]) on 11/02/2021 3:15:30 AM       Radiology DG Chest Port 1 View  Result Date: 11/02/2021 CLINICAL DATA:  Dyspnea EXAM: PORTABLE CHEST 1 VIEW COMPARISON:  Radiographs 10/16/2021 FINDINGS: Low lung volumes. No focal consolidation, pleural effusion, or pneumothorax. Normal cardiomediastinal silhouette. No acute osseous abnormality. Azygous fissure. Sternotomy and mediastinal clips. IMPRESSION: No active disease. Electronically Signed   By: Placido Sou M.D.   On: 11/02/2021 02:36    Medications Ordered in ED Medications  levofloxacin (LEVAQUIN) IVPB 750 mg (has no administration in time range)  methylPREDNISolone sodium  succinate (SOLU-MEDROL) 125 mg/2 mL injection 125 mg (has no administration in time range)  sodium chloride 0.9 % bolus 1,000 mL (has no administration in time range)  ipratropium-albuterol (DUONEB) 0.5-2.5 (3) MG/3ML nebulizer solution 3 mL (3 mLs Nebulization Given 11/02/21 0157)  methylPREDNISolone sodium succinate (SOLU-MEDROL) 125 mg/2 mL injection 125 mg (125 mg Intravenous Given 11/02/21 0204)  sodium chloride 0.9 % bolus 1,000 mL (1,000 mLs Intravenous New Bag/Given 11/02/21 0418)  ketorolac (TORADOL) 15 MG/ML injection 15 mg (15 mg Intravenous Given 11/02/21 0434)                                                                                                                                     Procedures .1-3 Lead EKG Interpretation  Performed by: Fatima Blank, MD Authorized by: Fatima Blank, MD     Interpretation: abnormal     ECG rate:  133   ECG rate assessment: tachycardic     Rhythm: sinus rhythm     Ectopy: none     Conduction: normal     (including critical care time)  Medical Decision Making / ED Course   Medical Decision Making Amount and/or Complexity of Data Reviewed Labs: ordered. Decision-making details documented in ED Course. Radiology: ordered and independent interpretation performed. Decision-making details documented in ED Course. ECG/medicine tests: ordered and independent interpretation performed. Decision-making details documented in ED Course.  Risk Prescription drug management. Decision regarding hospitalization. Diagnosis or treatment significantly limited by social determinants of health.    Patient presents with 1 week of gradually worsening shortness of breath and cough. On exam patient has bibasilar rales worse on left than right. She is satting well on room air. No evidence of volume overload  Concerning for pneumonia/bronchitis.  Will assess for pulmonary edema.  Patient is tachycardic and immobile.  Will obtain dimer to  assess for possible PE.  CBC without leukocytosis or anemia. Metabolic panel without significant electrolyte derangements or renal sufficiency Dimer negative TSH/T$ not consistent with thyroid storm. Chest x-ray without evidence of pneumonia, pneumothorax,  pulmonary edema or pleural effusions.  Patient provided with IV fluids and breathing treatment.  She reports having improved work of breathing after the breathing treatment.  Patient still tachycardic with persistent left lower lobe rales.  Additional neb was given. Started on empiric antibiotics for possible early pneumonia not visible on x-ray.  Will admit for further work up and management      Final Clinical Impression(s) / ED Diagnoses Final diagnoses:  Acute cough  Tachycardia           This chart was dictated using voice recognition software.  Despite best efforts to proofread,  errors can occur which can change the documentation meaning.    Fatima Blank, MD 11/02/21 [id number]

## 2021-11-02 NOTE — ED Notes (Signed)
Pt given fan to help keep cool as room is warm.

## 2021-11-02 NOTE — Progress Notes (Signed)
     Patient: Kaleeah Gingerich ZDG:[id number] DOB: [birth year]  Nurse reports severe hypertension with intractable nausea and vomiting that had no improvement  to antiemetics, tachycardia and neuro change noted as  increased aphasia.   History and meds reviewed  Neuro assessment findings show CVA with residual left sided weakness and aphasia at her baseline. Slight left labial flattening noted with speech that patient states is not new. Is answering questions appropriately now but per nurse was not when rapid called nor when rapid nurse first assessed. Patient admits to headache mostly felt behind eyes. Pain in abdomen reported as just nausea. Abdomen soft and with bowel sounds x4 Respirations non labored and sats stable  Hypertensie urgency Neuro checks every 2 hours No improvement in blood pressure or heart rate after 2 doses of labetalol and hydralazine Transferred to stepdown for nicardipine infusion Uncertain if patient was able to tolerate her mathimazole yesterday and hyperthyroid could be contributing  Mag low - replaced  TIA Head Ct negative for acute fndings. Will defer to day team for reassessment and determine need for f/u with MRI Patient already on statin and plavix  Intractable nausea and vomiting Improved only after ativan given, symptom reported as new after admission to hospital Questionable if symptom is r/t hypertension  Did have low grade fever - resp viral panel negative Denies diarrhea - had flu shot on 10/12/21 - denies flu exposure and covid/flu negative Keep clear liquid for now CXR when  awakens to eval for aspiration Clear liquids for now  [place] ordered Repeat ua with reflex culture d/t significant protein and leuks in prior  [place] L . Randol Kern NP [facility]

## 2021-11-02 NOTE — ED Notes (Signed)
Pt resting and listening to her phone, lights turned off for comfort per pt request, NAD noted, observed even RR, side rails up x2 for safety, plan of care ongoing, pt expresses no needs or concerns at this time, call light within reach, no further concerns as of present.

## 2021-11-02 NOTE — Telephone Encounter (Signed)
Budesonide was DENIED.   [place] wrote an appeal due to patient unable to use Advair due to prolong QT interval. Budesonide medically necessary for patient.   Faxed appeal to [employer] Fax:[phone]

## 2021-11-02 NOTE — ED Triage Notes (Signed)
Pt from home BIB [facility] from resp distress, pt c/o sob, was seen yesterday by EMS decline hospital, got worse tonight, 1 duoNeb in route. Wheezing to bilateral up lungs. Pt recently dx with PNA and bronchitis earlier this month, pt also with intermittent cough within the past month as well. Left side deficit d/t hx of CVA. Pt alert on arrival, VSS. Denies fever.

## 2021-11-02 NOTE — ED Notes (Addendum)
Pt readjusted in bed for comfort per request.

## 2021-11-02 NOTE — Progress Notes (Signed)
Patient performed peak flow measurement.  Pt had some trouble at first making a tight seal around the mouthpiece.  After several attempts and coaching Pt's best three efforts resulted in 85Lpm, 90Lpm and 90Lpm.

## 2021-11-02 NOTE — ED Notes (Signed)
Dr. Leonette Monarch notified that pt request pain medication for headache and would like something for her jitters/shaking from breathing treatments. Awaiting response

## 2021-11-02 NOTE — Progress Notes (Signed)
Patient with reports of vomiting throughout prior shift.  On review BP had been elevated.  Patient was nauseated on assessment and recheck of BP showed elevations on bilateral upper extremities.  Patient had significant expressive aphasia and when given paper communicated that her "speech was worse".  Pt relayed that speech was much worse than it had been earlier in the day.  Rapid nurse called and NP on call notified and at the bedside along with CN.  Patient video-called with a family member who verified her speech was not at baseline but had not spoken to her since yesterday. CBG 184. Verified elevated BP and multiple medications given per verbal order as follows:  Labetalol '20mg'$  2110, Compazine '10mg'$  2120, Hydralazine '10mg'$  2130 and Labetalol '20mg'$  2150.  Orders received to transfer to stepdown for Cardene gtt, KUB and head CT.    Transferred with CN and Rapid to bed 1227.  Report given to receiving RN.

## 2021-11-02 NOTE — ED Notes (Signed)
2 failed IV attempts to right arm. (Left arm deficit from prior CVAs). [place] EMT-P to try ultrasound guided IV

## 2021-11-02 NOTE — H&P (Signed)
History and Physical    Patient: Lori [name] JWJ:[id number] DOB: 06-17-1970 DOA: 11/02/2021 DOS: the patient was seen and examined on 11/02/2021 PCP: Ngetich, Nelda Bucks, NP  Patient coming from: Home  Chief Complaint:  Chief Complaint  Patient presents with   Respiratory Distress   HPI: Lori [name] is a 51 y.o. female with medical history significant of DM2, CVA w/ left side hemiparesis, CAD, anxiety, HTN, HLD, GERD. Presenting with cough. She reports that she's had cough for the last 2 weeks. She has seen her PCP and had been placed on nebulizers and steroids. However, she doesn't feel it has been working. She was given a referral to see pulmonology but that appt isn't til next week. Last night she felt particularly short of breath, so she decided to come to the ED for evaluation.    Review of Systems: As mentioned in the history of present illness. All other systems reviewed and are negative. Past Medical History:  Diagnosis Date   CAD (coronary artery disease)    Depression    Diabetes mellitus without complication ([place])    History of CT scan    History of mammogram    History of MRI    Hypertension    Pheochromocytoma    Pheochromocytoma    Stroke [facility])    Thyroid disease    Past Surgical History:  Procedure Laterality Date   ABDOMINAL HYSTERECTOMY     CESAREAN SECTION     3   CORONARY ARTERY BYPASS GRAFT     Right adrenal gland removal for pheochromocytoma Right    Social History:  reports that she has never smoked. She has never used smokeless tobacco. She reports that she does not currently use alcohol. She reports that she does not use drugs.  Allergies  Allergen Reactions   Sumatriptan Anaphylaxis   Contrast Media [Iodinated Contrast Media] Swelling   Penicillins Swelling    Mouth swells up and eyes swollen shut    Family History  Problem Relation Age of Onset   Diabetes Mother    Hypertension Father    Heart attack Father    Dementia Father     Diabetes Brother    Brain cancer Brother    Asthma Daughter    Sickle cell anemia Son     Prior to Admission medications   Medication Sig Start Date End Date Taking? Authorizing Provider  acetaminophen (TYLENOL) 325 MG tablet Take 2 tablets (650 mg total) by mouth every 6 (six) hours as needed for mild pain or headache. 02/26/21   Pokhrel, Corrie Mckusick, MD  albuterol (PROVENTIL) (2.5 MG/3ML) 0.083% nebulizer solution Take 3 mLs (2.5 mg total) by nebulization every 6 (six) hours as needed for wheezing or shortness of breath. 10/31/21   Ngetich, Dinah C, NP  atorvastatin (LIPITOR) 80 MG tablet Take 80 mg by mouth daily. 06/25/21   [provider]  blood glucose meter kit and supplies KIT Dispense based on patient and insurance preference. Use up to four times daily as directed. 03/16/21   Love, Ivan Anchors, PA-C  budesonide (PULMICORT) 0.25 MG/2ML nebulizer solution Take 2 mLs (0.25 mg total) by nebulization 2 (two) times daily. 10/31/21   Ngetich, Dinah C, NP  cephALEXin (KEFLEX) 500 MG capsule Take 1 capsule (500 mg total) by mouth 3 (three) times daily. 10/26/21   Lauree Chandler, NP  clopidogrel (PLAVIX) 75 MG tablet TAKE 1 TABLET BY MOUTH ONCE DAILY 05/23/21   Ngetich, Dinah C, NP  Continuous Blood Gluc  Receiver (DEXCOM G5 RECEIVER KIT) DEVI 1 Device by Does not apply route 4 (four) times daily. 10/22/21   Ngetich, Dinah C, NP  diclofenac Sodium (VOLTAREN) 1 % GEL Apply 4 g topically 4 (four) times daily. 03/15/21   Love, Ivan Anchors, PA-C  empagliflozin (JARDIANCE) 10 MG TABS tablet Take 1 tablet (10 mg total) by mouth daily before breakfast. 06/25/21   Early Osmond, MD  escitalopram (LEXAPRO) 20 MG tablet TAKE 1 TABLET BY MOUTH EVERY MORNING 05/23/21   Ngetich, Dinah C, NP  ezetimibe (ZETIA) 10 MG tablet Take 1 tablet (10 mg total) by mouth daily. 05/23/21   Garvin Fila, MD  gabapentin (NEURONTIN) 100 MG capsule TAKE 1 CAPSULE BY MOUTH THREE TIMES DAILY (BREAKFAST, LUNCH, BEDTIME) 05/23/21    Ngetich, Dinah C, NP  hydrOXYzine (ATARAX) 25 MG tablet Take 1 tablet (25 mg total) by mouth at bedtime as needed for anxiety. 08/30/21   Regalado, Belkys A, MD  Insulin Aspart FlexPen (NOVOLOG) 100 UNIT/ML ADMINISTER 4 UNITS UNDER THE SKIN THREE TIMES DAILY WITH MEALS 07/09/21   Ngetich, Dinah C, NP  insulin glargine (LANTUS SOLOSTAR) 100 UNIT/ML Solostar Pen Inject 35 Units into the skin at bedtime. 08/30/21   Regalado, Belkys A, MD  Insulin Pen Needle 32G X 4 MM MISC Use to inject insulin 4 times daily as directed. 03/15/21   Love, Ivan Anchors, PA-C  ipratropium-albuterol (DUONEB) 0.5-2.5 (3) MG/3ML SOLN Take 3 mLs by nebulization 2 (two) times daily. 10/22/21   Ngetich, Dinah C, NP  lip balm (CARMEX) ointment Apply topically as needed for lip care. 03/16/21   Love, Ivan Anchors, PA-C  lisinopril (ZESTRIL) 20 MG tablet Take 20 mg by mouth daily.    [provider]  LORazepam (ATIVAN) 0.5 MG tablet TAKE 1 TABLET BY MOUTH AT BEDTIME 05/23/21   Ngetich, Dinah C, NP  melatonin 3 MG TABS tablet Take 1 tablet (3 mg total) by mouth at bedtime. 08/30/21   Regalado, Belkys A, MD  methimazole (TAPAZOLE) 5 MG tablet TAKE 1 TABLET BY MOUTH EVERY MORNING 05/23/21   Ngetich, Dinah C, NP  metoprolol tartrate (LOPRESSOR) 100 MG tablet TAKE 1 TABLET BY MOUTH TWICE DAILY (BREAKFAST, BEDTIME) 05/23/21   Ngetich, Dinah C, NP  nystatin cream (MYCOSTATIN) Apply 1 Application topically 2 (two) times daily. Affected areas on breast fold and groin and sacral areas 07/31/21   Ngetich, Dinah C, NP  pantoprazole (PROTONIX) 40 MG tablet TAKE 1 TABLET BY MOUTH EVERY MORNING 05/23/21   Ngetich, Dinah C, NP  polyethylene glycol powder (GLYCOLAX/MIRALAX) 17 GM/SCOOP powder Take 17 g by mouth 2 (two) times daily. 03/15/21   Love, Ivan Anchors, PA-C  senna (SENOKOT) 8.6 MG tablet Take 1 tablet by mouth as needed for constipation.    [provider]  tamsulosin (FLOMAX) 0.4 MG CAPS capsule TAKE 1 CAPSULE BY MOUTH AT BEDTIME 05/23/21    Ngetich, Dinah C, NP  topiramate (TOPAMAX) 25 MG tablet TAKE 1 TABLET(25 MG) BY MOUTH TWICE DAILY 06/22/21   Ngetich, Dinah C, NP  zinc oxide (BALMEX) 11.3 % CREA cream Apply 1 Application topically 2 (two) times daily. 07/31/21   Ngetich, Nelda Bucks, NP    Physical Exam: Vitals:   11/02/21 0530 11/02/21 0545 11/02/21 0600 11/02/21 0615  BP: 125/86 122/74 (!) 136/90 (!) 141/90  Pulse: (!) 118 (!) 121 (!) 119 (!) 115  Resp: (!) 21 (!) 28 15 20   Temp:    99.1 F (37.3 C)  TempSrc:  Oral  SpO2: 95% 98% 98% 95%  Weight:      Height:       General: 51 y.o. female resting in bed in NAD Eyes: PERRL, normal sclera ENMT: Nares patent w/o discharge, orophaynx clear, dentition normal, ears w/o discharge/lesions/ulcers Neck: Supple, trachea midline Cardiovascular: tachy, +S1, S2, no m/g/r, equal pulses throughout Respiratory: CTABL, no w/r/r, normal WOB GI: BS+, NDNT, no masses noted, no organomegaly noted MSK: No e/c/c Neuro: A&O x 3, left hemiparesis; contracture of LUE Psyc: Appropriate interaction, anxious but cooperative  Data Reviewed:  Results for orders placed or performed during the hospital encounter of 11/02/21 (from the past 24 hour(s))  Resp Panel by RT-PCR (Flu A&B, Covid) Anterior Nasal Swab     Status: None   Collection Time: 11/02/21  1:46 AM   Specimen: Anterior Nasal Swab  Result Value Ref Range   SARS Coronavirus 2 by RT PCR NEGATIVE NEGATIVE   Influenza A by PCR NEGATIVE NEGATIVE   Influenza B by PCR NEGATIVE NEGATIVE  Comprehensive metabolic panel     Status: Abnormal   Collection Time: 11/02/21  1:46 AM  Result Value Ref Range   Sodium 140 135 - 145 mmol/L   Potassium 3.6 3.5 - 5.1 mmol/L   Chloride 106 98 - 111 mmol/L   CO2 25 22 - 32 mmol/L   Glucose, Bld 210 (H) 70 - 99 mg/dL   BUN 9 6 - 20 mg/dL   Creatinine, Ser 0.68 0.44 - 1.00 mg/dL   Calcium 8.5 (L) 8.9 - 10.3 mg/dL   Total Protein 6.9 6.5 - 8.1 g/dL   Albumin 3.3 (L) 3.5 - 5.0 g/dL   AST 12 (L) 15  - 41 U/L   ALT 15 0 - 44 U/L   Alkaline Phosphatase 77 38 - 126 U/L   Total Bilirubin 0.5 0.3 - 1.2 mg/dL   GFR, Estimated >60 >60 mL/min   Anion gap 9 5 - 15  Blood gas, venous     Status: None   Collection Time: 11/02/21  1:46 AM  Result Value Ref Range   pH, Ven 7.35 7.25 - 7.43   pCO2, Ven 45 44 - 60 mmHg   pO2, Ven 37 32 - 45 mmHg   Bicarbonate 24.8 20.0 - 28.0 mmol/L   Acid-base deficit 1.1 0.0 - 2.0 mmol/L   O2 Saturation 55.5 %   Patient temperature 37.0   CBC with Differential/Platelet     Status: Abnormal   Collection Time: 11/02/21  1:46 AM  Result Value Ref Range   WBC 9.4 4.0 - 10.5 K/uL   RBC 4.94 3.87 - 5.11 MIL/uL   Hemoglobin 12.6 12.0 - 15.0 g/dL   HCT 39.2 36.0 - 46.0 %   MCV 79.4 (L) 80.0 - 100.0 fL   MCH 25.5 (L) 26.0 - 34.0 pg   MCHC 32.1 30.0 - 36.0 g/dL   RDW 15.8 (H) 11.5 - 15.5 %   Platelets 361 150 - 400 K/uL   nRBC 0.0 0.0 - 0.2 %   Neutrophils Relative % 66 %   Neutro Abs 6.2 1.7 - 7.7 K/uL   Lymphocytes Relative 22 %   Lymphs Abs 2.1 0.7 - 4.0 K/uL   Monocytes Relative 7 %   Monocytes Absolute 0.6 0.1 - 1.0 K/uL   Eosinophils Relative 4 %   Eosinophils Absolute 0.4 0.0 - 0.5 K/uL   Basophils Relative 1 %   Basophils Absolute 0.1 0.0 - 0.1 K/uL   Immature Granulocytes 0 %  Abs Immature Granulocytes 0.02 0.00 - 0.07 K/uL  D-dimer, quantitative     Status: None   Collection Time: 11/02/21  1:46 AM  Result Value Ref Range   D-Dimer, Quant 0.41 0.00 - 0.50 ug/mL-FEU  POC CBG, ED     Status: Abnormal   Collection Time: 11/02/21  1:57 AM  Result Value Ref Range   Glucose-Capillary 213 (H) 70 - 99 mg/dL  T4, free     Status: Abnormal   Collection Time: 11/02/21  2:27 AM  Result Value Ref Range   Free T4 1.32 (H) 0.61 - 1.12 ng/dL  TSH     Status: None   Collection Time: 11/02/21  2:46 AM  Result Value Ref Range   TSH 1.399 0.350 - 4.500 uIU/mL   CXR: No active disease.  EKG: sinus tach, no st elevations  Assessment and  Plan: Dyspnea Cough     - placed in obs, SDU     - started on duonebs, steroids, lvq; her respiratory status has improved and her exam has improved     - CT chest shows bibasilar atelectasis vs PNA; checking procal, if negative, can d/c lvq     - continue nebs, steroids for now     - add hycodan, tessalon     - add protonix     - checking RVP     - COVID negative  HTN     - hold her lisinopril as it may be contributing to her cough     - resume home regimen otherwise when confirmed  DM2     - recent A1c was 10.5     - add SSI, glucose checks, DM diet  HLD     - continue home regimen when confirmed  Hx of CVA     - continue home regimen when confirmed  Anxiety     - continue home regimen when confirmed  GERD     - protonix  CAD     - continue home regimen when confirmed  Hyperthyroidism     - continue home regimen when confirmed  Advance Care Planning:   Code Status: FULL  Consults: None  Family Communication: None at bedside  Severity of Illness: The appropriate patient status for this patient is OBSERVATION. Observation status is judged to be reasonable and necessary in order to provide the required intensity of service to ensure the patient's safety. The patient's presenting symptoms, physical exam findings, and initial radiographic and laboratory data in the context of their medical condition is felt to place them at decreased risk for further clinical deterioration. Furthermore, it is anticipated that the patient will be medically stable for discharge from the hospital within 2 midnights of admission.   Author: Jonnie Finner, DO 11/02/2021 7:30 AM  For on call review [email or web address].

## 2021-11-03 ENCOUNTER — Observation Stay (HOSPITAL_COMMUNITY): Payer: Medicare Other

## 2021-11-03 DIAGNOSIS — Z86018 Personal history of other benign neoplasm: Secondary | ICD-10-CM | POA: Diagnosis not present

## 2021-11-03 DIAGNOSIS — F32A Depression, unspecified: Secondary | ICD-10-CM

## 2021-11-03 DIAGNOSIS — E119 Type 2 diabetes mellitus without complications: Secondary | ICD-10-CM | POA: Diagnosis present

## 2021-11-03 DIAGNOSIS — E860 Dehydration: Secondary | ICD-10-CM | POA: Diagnosis present

## 2021-11-03 DIAGNOSIS — Z794 Long term (current) use of insulin: Secondary | ICD-10-CM | POA: Diagnosis not present

## 2021-11-03 DIAGNOSIS — I160 Hypertensive urgency: Secondary | ICD-10-CM | POA: Diagnosis present

## 2021-11-03 DIAGNOSIS — E039 Hypothyroidism, unspecified: Secondary | ICD-10-CM | POA: Diagnosis present

## 2021-11-03 DIAGNOSIS — Z20822 Contact with and (suspected) exposure to covid-19: Secondary | ICD-10-CM | POA: Diagnosis present

## 2021-11-03 DIAGNOSIS — E0590 Thyrotoxicosis, unspecified without thyrotoxic crisis or storm: Secondary | ICD-10-CM

## 2021-11-03 DIAGNOSIS — B3749 Other urogenital candidiasis: Secondary | ICD-10-CM | POA: Diagnosis present

## 2021-11-03 DIAGNOSIS — Z8249 Family history of ischemic heart disease and other diseases of the circulatory system: Secondary | ICD-10-CM | POA: Diagnosis not present

## 2021-11-03 DIAGNOSIS — Z90710 Acquired absence of both cervix and uterus: Secondary | ICD-10-CM | POA: Diagnosis not present

## 2021-11-03 DIAGNOSIS — R000 Tachycardia, unspecified: Secondary | ICD-10-CM

## 2021-11-03 DIAGNOSIS — Z951 Presence of aortocoronary bypass graft: Secondary | ICD-10-CM | POA: Diagnosis not present

## 2021-11-03 DIAGNOSIS — I10 Essential (primary) hypertension: Secondary | ICD-10-CM

## 2021-11-03 DIAGNOSIS — E785 Hyperlipidemia, unspecified: Secondary | ICD-10-CM | POA: Diagnosis present

## 2021-11-03 DIAGNOSIS — Z833 Family history of diabetes mellitus: Secondary | ICD-10-CM | POA: Diagnosis not present

## 2021-11-03 DIAGNOSIS — R059 Cough, unspecified: Secondary | ICD-10-CM | POA: Diagnosis present

## 2021-11-03 DIAGNOSIS — R051 Acute cough: Secondary | ICD-10-CM

## 2021-11-03 DIAGNOSIS — I2510 Atherosclerotic heart disease of native coronary artery without angina pectoris: Secondary | ICD-10-CM | POA: Diagnosis present

## 2021-11-03 DIAGNOSIS — K219 Gastro-esophageal reflux disease without esophagitis: Secondary | ICD-10-CM | POA: Diagnosis present

## 2021-11-03 DIAGNOSIS — I639 Cerebral infarction, unspecified: Secondary | ICD-10-CM

## 2021-11-03 DIAGNOSIS — R0602 Shortness of breath: Secondary | ICD-10-CM | POA: Diagnosis present

## 2021-11-03 DIAGNOSIS — F419 Anxiety disorder, unspecified: Secondary | ICD-10-CM

## 2021-11-03 DIAGNOSIS — E1140 Type 2 diabetes mellitus with diabetic neuropathy, unspecified: Secondary | ICD-10-CM

## 2021-11-03 DIAGNOSIS — E896 Postprocedural adrenocortical (-medullary) hypofunction: Secondary | ICD-10-CM | POA: Diagnosis present

## 2021-11-03 DIAGNOSIS — R0600 Dyspnea, unspecified: Secondary | ICD-10-CM | POA: Diagnosis present

## 2021-11-03 DIAGNOSIS — E7849 Other hyperlipidemia: Secondary | ICD-10-CM

## 2021-11-03 DIAGNOSIS — I69354 Hemiplegia and hemiparesis following cerebral infarction affecting left non-dominant side: Secondary | ICD-10-CM | POA: Diagnosis not present

## 2021-11-03 LAB — URINALYSIS, MICROSCOPIC (REFLEX): RBC / HPF: NONE SEEN RBC/hpf (ref 0–5)

## 2021-11-03 LAB — COMPREHENSIVE METABOLIC PANEL
ALT: 12 U/L (ref 0–44)
AST: 15 U/L (ref 15–41)
Albumin: 3.4 g/dL — ABNORMAL LOW (ref 3.5–5.0)
Alkaline Phosphatase: 77 U/L (ref 38–126)
Anion gap: 12 (ref 5–15)
BUN: 10 mg/dL (ref 6–20)
CO2: 20 mmol/L — ABNORMAL LOW (ref 22–32)
Calcium: 8.6 mg/dL — ABNORMAL LOW (ref 8.9–10.3)
Chloride: 106 mmol/L (ref 98–111)
Creatinine, Ser: 0.63 mg/dL (ref 0.44–1.00)
GFR, Estimated: >60 mL/min (ref >60)
Glucose, Bld: 232 mg/dL — ABNORMAL HIGH (ref 70–99)
Potassium: 4 mmol/L (ref 3.5–5.1)
Sodium: 138 mmol/L (ref 135–145)
Total Bilirubin: 1 mg/dL (ref 0.3–1.2)
Total Protein: 7.2 g/dL (ref 6.5–8.1)

## 2021-11-03 LAB — GLUCOSE, CAPILLARY
Glucose-Capillary: 262 mg/dL — ABNORMAL HIGH (ref 70–99)
Glucose-Capillary: 218 mg/dL — ABNORMAL HIGH (ref 70–99)
Glucose-Capillary: 184 mg/dL — ABNORMAL HIGH (ref 70–99)
Glucose-Capillary: 170 mg/dL — ABNORMAL HIGH (ref 70–99)

## 2021-11-03 LAB — URINALYSIS, ROUTINE W REFLEX MICROSCOPIC
Bilirubin Urine: NEGATIVE
Glucose, UA: 250 mg/dL — AB
Hgb urine dipstick: NEGATIVE
Ketones, ur: 15 mg/dL — AB
Nitrite: NEGATIVE
Protein, ur: NEGATIVE mg/dL
Specific Gravity, Urine: <1.005 — ABNORMAL LOW (ref 1.005–1.030)
pH: 6.5 (ref 5.0–8.0)

## 2021-11-03 LAB — CBC
HCT: 40.3 % (ref 36.0–46.0)
Hemoglobin: 13.1 g/dL (ref 12.0–15.0)
MCH: 25.8 pg — ABNORMAL LOW (ref 26.0–34.0)
MCHC: 32.5 g/dL (ref 30.0–36.0)
MCV: 79.3 fL — ABNORMAL LOW (ref 80.0–100.0)
Platelets: 304 10*3/uL (ref 150–400)
RBC: 5.08 MIL/uL (ref 3.87–5.11)
RDW: 15.8 % — ABNORMAL HIGH (ref 11.5–15.5)
WBC: 17.2 10*3/uL — ABNORMAL HIGH (ref 4.0–10.5)
nRBC: 0 % (ref 0.0–0.2)

## 2021-11-03 LAB — MAGNESIUM: Magnesium: 1.7 mg/dL (ref 1.7–2.4)

## 2021-11-03 LAB — MRSA NEXT GEN BY PCR, NASAL: MRSA by PCR Next Gen: NOT DETECTED

## 2021-11-03 MED ORDER — SODIUM CHLORIDE 0.9 % IV SOLN
2.0000 g | INTRAVENOUS | Status: DC
  Administered 2021-11-03 – 2021-11-06 (×4): 2 g via INTRAVENOUS
  Filled 2021-11-03 (×4): qty 20

## 2021-11-03 MED ORDER — MAGNESIUM SULFATE 2 GM/50ML IV SOLN
2.0000 g | Freq: Once | INTRAVENOUS | Status: AC
  Administered 2021-11-03: 2 g via INTRAVENOUS
  Filled 2021-11-03: qty 50

## 2021-11-03 MED ORDER — INSULIN GLARGINE-YFGN 100 UNIT/ML ~~LOC~~ SOLN
25.0000 [IU] | Freq: Every day | SUBCUTANEOUS | Status: DC
  Administered 2021-11-03: 25 [IU] via SUBCUTANEOUS
  Filled 2021-11-03: qty 0.25

## 2021-11-03 MED ORDER — FLUCONAZOLE 100MG IVPB
100.0000 mg | INTRAVENOUS | Status: DC
  Administered 2021-11-03: 100 mg via INTRAVENOUS
  Filled 2021-11-03: qty 50

## 2021-11-03 MED ORDER — INSULIN ASPART 100 UNIT/ML IJ SOLN: 0.0000 [IU] | INTRAMUSCULAR | Status: DC

## 2021-11-03 MED ORDER — ENSURE ENLIVE PO LIQD
237.0000 mL | Freq: Two times a day (BID) | ORAL | Status: DC
  Administered 2021-11-05 – 2021-11-07 (×4): 237 mL via ORAL

## 2021-11-03 MED ORDER — PANTOPRAZOLE SODIUM 40 MG PO TBEC
40.0000 mg | DELAYED_RELEASE_TABLET | Freq: Every day | ORAL | Status: DC
  Administered 2021-11-03 – 2021-11-07 (×5): 40 mg via ORAL
  Filled 2021-11-03 (×5): qty 1

## 2021-11-03 MED ORDER — HYDROXYZINE HCL 25 MG PO TABS: 25.0000 mg | ORAL_TABLET | Freq: Every evening | ORAL | Status: DC | PRN

## 2021-11-03 MED ORDER — FLUCONAZOLE IN SODIUM CHLORIDE 200-0.9 MG/100ML-% IV SOLN
200.0000 mg | INTRAVENOUS | Status: DC
  Administered 2021-11-04 – 2021-11-05 (×2): 200 mg via INTRAVENOUS
  Filled 2021-11-03 (×2): qty 100

## 2021-11-03 MED ORDER — NICARDIPINE HCL IN NACL 40-0.83 MG/200ML-% IV SOLN
3.0000 mg/h | INTRAVENOUS | Status: DC
  Filled 2021-11-03: qty 200

## 2021-11-03 MED ORDER — SODIUM CHLORIDE 0.9 % IV SOLN: INTRAVENOUS | Status: AC

## 2021-11-03 MED ORDER — PANTOPRAZOLE SODIUM 40 MG IV SOLR: 40.0000 mg | INTRAVENOUS | Status: DC

## 2021-11-03 MED ORDER — METOCLOPRAMIDE HCL 5 MG/5ML PO SOLN
5.0000 mg | Freq: Three times a day (TID) | ORAL | Status: DC
  Filled 2021-11-03: qty 5

## 2021-11-03 MED ORDER — INSULIN ASPART 100 UNIT/ML IJ SOLN
0.0000 [IU] | Freq: Three times a day (TID) | INTRAMUSCULAR | Status: DC
  Administered 2021-11-03: 8 [IU] via SUBCUTANEOUS
  Administered 2021-11-03: 5 [IU] via SUBCUTANEOUS
  Administered 2021-11-03 – 2021-11-04 (×2): 3 [IU] via SUBCUTANEOUS
  Administered 2021-11-04: 5 [IU] via SUBCUTANEOUS
  Administered 2021-11-04: 3 [IU] via SUBCUTANEOUS
  Administered 2021-11-05: 2 [IU] via SUBCUTANEOUS
  Administered 2021-11-05: 5 [IU] via SUBCUTANEOUS
  Administered 2021-11-05: 2 [IU] via SUBCUTANEOUS
  Administered 2021-11-06: 8 [IU] via SUBCUTANEOUS
  Administered 2021-11-06: 15 [IU] via SUBCUTANEOUS
  Administered 2021-11-07: 5 [IU] via SUBCUTANEOUS
  Administered 2021-11-07: 3 [IU] via SUBCUTANEOUS

## 2021-11-03 MED ORDER — ORAL CARE MOUTH RINSE: 15.0000 mL | OROMUCOSAL | Status: DC | PRN

## 2021-11-03 MED ORDER — AMLODIPINE BESYLATE 5 MG PO TABS
5.0000 mg | ORAL_TABLET | Freq: Every day | ORAL | Status: DC
  Administered 2021-11-03: 5 mg via ORAL
  Filled 2021-11-03: qty 1

## 2021-11-03 MED ORDER — BUDESONIDE 0.25 MG/2ML IN SUSP
0.5000 mg | Freq: Two times a day (BID) | RESPIRATORY_TRACT | Status: DC
  Filled 2021-11-03: qty 4

## 2021-11-03 MED ORDER — METOCLOPRAMIDE HCL 5 MG/ML IJ SOLN
5.0000 mg | Freq: Four times a day (QID) | INTRAMUSCULAR | Status: DC
  Administered 2021-11-03 – 2021-11-06 (×13): 5 mg via INTRAVENOUS
  Filled 2021-11-03 (×13): qty 2

## 2021-11-03 MED ORDER — GUAIFENESIN ER 600 MG PO TB12
1200.0000 mg | ORAL_TABLET | Freq: Two times a day (BID) | ORAL | Status: DC
  Administered 2021-11-04 – 2021-11-07 (×5): 1200 mg via ORAL
  Filled 2021-11-03 (×9): qty 2

## 2021-11-03 MED ORDER — ACETAMINOPHEN 10 MG/ML IV SOLN
1000.0000 mg | Freq: Once | INTRAVENOUS | Status: AC
  Administered 2021-11-03: 1000 mg via INTRAVENOUS
  Filled 2021-11-03: qty 100

## 2021-11-03 MED ORDER — LEVALBUTEROL HCL 0.63 MG/3ML IN NEBU
0.6300 mg | INHALATION_SOLUTION | Freq: Two times a day (BID) | RESPIRATORY_TRACT | Status: DC
  Administered 2021-11-03 – 2021-11-07 (×9): 0.63 mg via RESPIRATORY_TRACT
  Filled 2021-11-03 (×10): qty 3

## 2021-11-03 MED ORDER — IPRATROPIUM BROMIDE 0.02 % IN SOLN
0.5000 mg | Freq: Three times a day (TID) | RESPIRATORY_TRACT | Status: DC
  Administered 2021-11-03 – 2021-11-04 (×2): 0.5 mg via RESPIRATORY_TRACT
  Filled 2021-11-03 (×2): qty 2.5

## 2021-11-03 MED ORDER — BUDESONIDE 0.5 MG/2ML IN SUSP
0.5000 mg | Freq: Two times a day (BID) | RESPIRATORY_TRACT | Status: DC
  Administered 2021-11-03 – 2021-11-07 (×10): 0.5 mg via RESPIRATORY_TRACT
  Filled 2021-11-03 (×10): qty 2

## 2021-11-03 MED ORDER — INSULIN ASPART 100 UNIT/ML IJ SOLN: 0.0000 [IU] | Freq: Three times a day (TID) | INTRAMUSCULAR | Status: DC

## 2021-11-03 NOTE — Evaluation (Signed)
Clinical/Bedside Swallow Evaluation Patient Details  Name: Lori [name] MRN: [id number] Date of Birth: April 10, 1970  Today's Date: 11/03/2021 Time: SLP Start Time (ACUTE ONLY): 30 SLP Stop Time (ACUTE ONLY): 1348 SLP Time Calculation (min) (ACUTE ONLY): 9 min  Past Medical History:  Past Medical History:  Diagnosis Date   CAD (coronary artery disease)    Depression    Diabetes mellitus without complication ([place])    History of CT scan    History of mammogram    History of MRI    Hypertension    Pheochromocytoma    Pheochromocytoma    Stroke [facility])    Thyroid disease    Past Surgical History:  Past Surgical History:  Procedure Laterality Date   ABDOMINAL HYSTERECTOMY     CESAREAN SECTION     3   CORONARY ARTERY BYPASS GRAFT     Right adrenal gland removal for pheochromocytoma Right    HPI:  Lori [name] is a 51 y.o. female who presented with cough. She reports that she's had cough for the last 2 weeks. She has seen her PCP and had been placed on nebulizers and steroids. However, she doesn't feel it has been working.  Head CT and CXT 9/30 without acute findings. Pt with medical history significant of DM2, CVA w/ left side hemiparesis, CAD, anxiety, HTN, HLD, GERD.    Assessment / Plan / Recommendation  Clinical Impression  Pt presents with a moderate oral dysphagia.  Pt is known to this service from prior admissions with hx of silent aspiration, requiring nectar thick liquids in the past.  Pt advanced to regular texture diet with thin liquids during acute stay in June and during CIR admission in February.  Pt able to siphon from straw but air heard at times 2/2 decreased labial seal.  There was prolonged oral phase with regular solid texture.  There was moderate residue on hard palate on L side of oral cavity.  But with lingual sweep and liquid wash, pt was able to fully clear oral cavity.  There were no clinical s/s of aspiration with any consistencies trialed today.  Pt  would like to resume a regular texture diet, which she consumes at baseline.  If medically appropriate, given recent N/V, pt appears safe to resume baseline diet.    Recommend regular texture solids with thin liquid by straw.     Pt endorses new changes to speech.  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  Full cognitive linguistic evaluation to follow.  SLP Visit Diagnosis: Dysphagia, oral phase (R13.11)    Aspiration Risk  Mild aspiration risk    Diet Recommendation Regular;Thin liquid   Liquid Administration via: Straw Medication Administration:  (As tolerated, no specific precautions) Supervision: Staff to assist with self feeding Compensations: Slow rate;Small sips/bites Postural Changes: Seated upright at 90 degrees    Other  Recommendations Oral Care Recommendations: Oral care BID    Recommendations for follow up therapy are one component of a multi-disciplinary discharge planning process, led by the attending physician.  Recommendations may be updated based on patient status, additional functional criteria and insurance authorization.  Follow up Recommendations No SLP follow up      Assistance Recommended at Discharge None  Functional Status Assessment Patient has not had a recent decline in their functional status  Frequency and Duration min 1 x/week  1 week       Prognosis  Swallow Study   General Date of Onset: 11/02/21 HPI: Lori [name] is a 51 y.o. female who presented with cough. She reports that she's had cough for the last 2 weeks. She has seen her PCP and had been placed on nebulizers and steroids. However, she doesn't feel it has been working.  Head CT and CXT 9/30 without acute findings. Pt with medical history significant of DM2, CVA w/ left side hemiparesis, CAD, anxiety, HTN, HLD, GERD. Type of Study: Bedside Swallow Evaluation Diet Prior to this Study: Thin liquids Behavior/Cognition:  Alert;Cooperative;Pleasant mood Oral Cavity Assessment: Within Functional Limits Oral Care Completed by SLP: No Oral Cavity - Dentition: Adequate natural dentition Self-Feeding Abilities: Needs assist Patient Positioning: Upright in bed Baseline Vocal Quality:  (Harsh)    Oral/Motor/Sensory Function Overall Oral Motor/Sensory Function: Moderate impairment Facial ROM: Reduced left Facial Symmetry: Abnormal symmetry left Lingual ROM: Within Functional Limits Lingual Symmetry: Within Functional Limits Velum: Within Functional Limits Mandible: Within Functional Limits   Ice Chips Ice chips: Not tested   Thin Liquid Thin Liquid: Within functional limits Presentation: Straw    Nectar Thick Nectar Thick Liquid: Not tested   Honey Thick Honey Thick Liquid: Not tested   Puree Puree: Within functional limits   Solid     Solid: Impaired Oral Phase Functional Implications: Prolonged oral transit;Oral residue      Celedonio Savage, MA, [place] Office: [phone] 11/03/2021,3:15 PM

## 2021-11-03 NOTE — Progress Notes (Signed)
PROGRESS NOTE    Lori [name]  QPR:[id number] DOB: 31-May-1970 DOA: 11/02/2021 PCP: [name], NP    Chief Complaint  Patient presents with   Respiratory Distress    Brief Narrative:  Patient is an unfortunate 51 year old female history of type 2 diabetes, CVA with residual left-sided hemiparesis, CAD, anxiety, hypertension, hyperlipidemia, GERD presented to the ED with cough x2 weeks, shortness of breath with no improvement despite nebs and steroids in the outpatient setting.  Patient had a referral to see pulmonology but symptoms worsened and presented to the ED.  Patient admitted with dyspnea and cough, placed on nebs and steroids.  CT chest done with bibasilar atelectasis versus pneumonia.  Procalcitonin was negative.  Urinalysis also concerning for UTI.  On hospital day 1 while in the stepdown unit due to concerns for neurological change and worsening aphasia head CT was obtained which was negative for any acute abnormalities.  Patient noted to have nausea and emesis on initial presentation.   Assessment & Plan:   Principal Problem:   Dyspnea Active Problems:   Essential hypertension   Diabetes (HCC)   Hyperthyroidism   CAD (coronary artery disease)   Anxiety and depression   HLD (hyperlipidemia)   CVA (cerebral vascular accident) ([place])   Cough   GERD (gastroesophageal reflux disease)  #1 dyspnea/cough -??  Questionable etiology. -CT chest with bibasilar atelectasis versus pneumonia. -Procalcitonin noted to be negative. -Patient with sats of 99 to 100% on room air. -Patient noted to have nausea and emesis overnight, chest x-ray done this morning unremarkable. -Patient with some clinical improvement. -Resume back on home regimen Pulmicort. -Continue Atrovent and Xopenex nebs, steroid taper. -Discontinue Tessalon Perles and placed on Mucinex. -Continue to hold ACE inhibitor as patient presented with cough. -Supportive care.  2.  Nausea/emesis -??   Etiology.??  Secondary to UTI -Abdominal films negative for obstruction. -Patient stated to RN was on Reglan prior to admission and as such we will place on IV Reglan for now pending nausea and emesis. -Advance to full liquid diet. -IV antiemetics, IV fluids, supportive care.  3.  Dehydration -Fluids.  4.  UTI/candiduria -Check urine cultures. -Place on IV Rocephin pending urine culture results. -Change IV Diflucan to 200 mg daily due to concerns for candiduria. -IV fluids, supportive care.  5.  Sinus tachycardia -Likely secondary to acute infection versus secondary to history of hypothyroidism. -Continue home regimen Lopressor. -Continue IV antibiotics for treatment of UTI. -IV fluids, supportive care.  6.  Hyperthyroidism -Continue home regimen Lopressor for rate control. -Continue methimazole. -Outpatient follow-up with PCP/endocrinology.  7.  Hypertensive urgency -BP improved on Cardene drip. -Wean Cardene drip to off. -Continue home regimen Lopressor.  8.  Diabetes mellitus type 2 -Hemoglobin A1c 10.5 (10/23/2021) -CBG 262 this morning. -Placed on Semglee 25 units daily, SSI. -Resume home regimen Neurontin. -Per RN patient states on Reglan prior to meals and as such we will place on IV Reglan 5 mg every 6 hours, once nausea and vomiting improves could change IV Reglan to oral Reglan.  9.  Hyperlipidemia -Continue home regimen statin, Zetia.  10.  History of CVA with left hemiparesis -Continue risk factor modification. -Continue Plavix, statin, Zetia.  11.  Anxiety -Continue home regimen Lexapro, IV Ativan as needed.  12.  GERD -PPI.  13.  CAD -Stable. -Continue Lipitor, Plavix, Zetia, Lopressor.    DVT prophylaxis: Lovenox Code Status: Full Family Communication: Updated patient.  No family at bedside. Disposition: TBD  Status is: Inpatient The  patient will require care spanning > 2 midnights and should be moved to inpatient because: Severity of  illness   Consultants:  None  Procedures:  CT head 11/03/2021 CT chest 11/02/2021 Chest x-ray 11/03/2021   Antimicrobials:  IV Rocephin 11/03/2021 >>>>   Subjective: States some improvement with shortness of breath since admission.  No chest pain.  No further nausea or vomiting this morning.  Events overnight noted.  Tolerating clear liquids.  Denies any difficulty swallowing.  Asking for diet to be advanced.  Patient with complaints of dysuria and suprapubic/lower abdominal pain.  Currently on Cardizem drip.  Objective: Vitals:   11/03/21 1330 11/03/21 1400 11/03/21 1439 11/03/21 1510  BP: (!) 156/105 (!) 160/103 (!) 150/96 (!) 155/96  Pulse: (!) 114 (!) 106 (!) 110 (!) 101  Resp: 16 (!) 26 (!) 27 (!) 25  Temp:      TempSrc:      SpO2: 100% 100% 99% 100%  Weight:      Height:        Intake/Output Summary (Last 24 hours) at 11/03/2021 1516 Last data filed at 11/03/2021 1511 Gross per 24 hour  Intake 1721.31 ml  Output 2020 ml  Net -298.69 ml   Filed Weights   11/02/21 0109 11/02/21 2300  Weight: 81.6 kg 84.1 kg    Examination:  General exam: Appears calm and comfortable  Respiratory system: Some bibasilar coarse breath sounds otherwise clear.  No wheezing.  No crackles.  Fair air movement.  Cardiovascular system: Tachycardia.  No murmurs rubs or gallops.  No JVD.  No lower extremity edema.  Gastrointestinal system: Abdomen is soft, nondistended, some tenderness to palpation in the suprapubic region/lower abdominal region bilaterally.  Positive bowel sounds.  No rebound.  No guarding.  Central nervous system: Alert and oriented.  Aphasic.  Residual chronic left upper extremity weakness.  Extremities: Left upper extremity weakness.  Skin: No rashes, lesions or ulcers Psychiatry: Judgement and insight appear normal. Mood & affect appropriate.     Data Reviewed: I have personally reviewed following labs and imaging studies  CBC: Recent Labs  Lab 11/02/21 0146  11/02/21 2329 11/03/21 0451  WBC 9.4 13.2* 17.2*  NEUTROABS 6.2  --   --   HGB 12.6 12.9 13.1  HCT 39.2 39.0 40.3  MCV 79.4* 78.2* 79.3*  PLT 361 323 419    Basic Metabolic Panel: Recent Labs  Lab 11/02/21 0146 11/02/21 2329 11/03/21 0451 11/03/21 0811  NA 140 137 138  --   K 3.6 3.7 4.0  --   CL 106 107 106  --   CO2 25 20* 20*  --   GLUCOSE 210* 213* 232*  --   BUN '9 11 10  '$ --   CREATININE 0.68 0.68 0.63  --   CALCIUM 8.5* 8.7* 8.6*  --   MG  --  1.7  --  1.7    GFR: Estimated Creatinine Clearance: 85.5 mL/min (by C-G formula based on SCr of 0.63 mg/dL).  Liver Function Tests: Recent Labs  Lab 11/02/21 0146 11/02/21 2329 11/03/21 0451  AST 12* 11* 15  ALT '15 13 12  '$ ALKPHOS 77 77 77  BILITOT 0.5 1.0 1.0  PROT 6.9 7.5 7.2  ALBUMIN 3.3* 3.6 3.4*    CBG: Recent Labs  Lab 11/02/21 1549 11/02/21 1658 11/02/21 2056 11/03/21 0812 11/03/21 1204  GLUCAP 207* 229* 184* 262* 218*     Recent Results (from the past 240 hour(s))  Resp Panel by RT-PCR (Flu A&B, Covid)  Anterior Nasal Swab     Status: None   Collection Time: 11/02/21  1:46 AM   Specimen: Anterior Nasal Swab  Result Value Ref Range Status   SARS Coronavirus 2 by RT PCR NEGATIVE NEGATIVE Final    Comment: (NOTE) SARS-CoV-2 target nucleic acids are NOT DETECTED.  The SARS-CoV-2 RNA is generally detectable in upper respiratory specimens during the acute phase of infection. The lowest concentration of SARS-CoV-2 viral copies this assay can detect is 138 copies/mL. A negative result does not preclude SARS-Cov-2 infection and should not be used as the sole basis for treatment or other patient management decisions. A negative result may occur with  improper specimen collection/handling, submission of specimen other than nasopharyngeal swab, presence of viral mutation(s) within the areas targeted by this assay, and inadequate number of viral copies(<138 copies/mL). A negative result must be combined  with clinical observations, patient history, and epidemiological information. The expected result is Negative.  Fact Sheet for Patients:  EntrepreneurPulse.com.au  Fact Sheet for Healthcare Providers:  IncredibleEmployment.be  This test is no t yet approved or cleared by the [place] FDA and  has been authorized for detection and/or diagnosis of SARS-CoV-2 by FDA under an Emergency Use Authorization (EUA). This EUA will remain  in effect (meaning this test can be used) for the duration of the COVID-19 declaration under Section 564(b)(1) of the Act, 21 U.S.C.section 360bbb-3(b)(1), unless the authorization is terminated  or revoked sooner.       Influenza A by PCR NEGATIVE NEGATIVE Final   Influenza B by PCR NEGATIVE NEGATIVE Final    Comment: (NOTE) The Xpert Xpress SARS-CoV-2/FLU/RSV plus assay is intended as an aid in the diagnosis of influenza from Nasopharyngeal swab specimens and should not be used as a sole basis for treatment. Nasal washings and aspirates are unacceptable for Xpert Xpress SARS-CoV-2/FLU/RSV testing.  Fact Sheet for Patients: EntrepreneurPulse.com.au  Fact Sheet for Healthcare Providers: IncredibleEmployment.be  This test is not yet approved or cleared by the [place] FDA and has been authorized for detection and/or diagnosis of SARS-CoV-2 by FDA under an Emergency Use Authorization (EUA). This EUA will remain in effect (meaning this test can be used) for the duration of the COVID-19 declaration under Section 564(b)(1) of the Act, 21 U.S.C. section 360bbb-3(b)(1), unless the authorization is terminated or revoked.  Performed at [facility], [place] [address], [place], [place] [address]   MRSA Next Gen by PCR, Nasal     Status: None   Collection Time: 11/02/21  5:10 AM   Specimen: Nasal Mucosa; Nasal Swab  Result Value Ref Range Status   MRSA by PCR  Next Gen NOT DETECTED NOT DETECTED Final    Comment: (NOTE) The GeneXpert MRSA Assay (FDA approved for NASAL specimens only), is one component of a comprehensive MRSA colonization surveillance program. It is not intended to diagnose MRSA infection nor to guide or monitor treatment for MRSA infections. Test performance is not FDA approved in patients less than 70 years old. Performed at [facility], [place] [address], [place], [place] [address]   Respiratory (~20 pathogens) panel by PCR     Status: None   Collection Time: 11/02/21  6:05 PM   Specimen: Nasopharyngeal Swab; Respiratory  Result Value Ref Range Status   Adenovirus NOT DETECTED NOT DETECTED Final   Coronavirus 229E NOT DETECTED NOT DETECTED Final    Comment: (NOTE) The Coronavirus on the Respiratory Panel, DOES NOT test for the novel  Coronavirus (2019  nCoV)    Coronavirus HKU1 NOT DETECTED NOT DETECTED Final   Coronavirus NL63 NOT DETECTED NOT DETECTED Final   Coronavirus OC43 NOT DETECTED NOT DETECTED Final   Metapneumovirus NOT DETECTED NOT DETECTED Final   Rhinovirus / Enterovirus NOT DETECTED NOT DETECTED Final   Influenza A NOT DETECTED NOT DETECTED Final   Influenza B NOT DETECTED NOT DETECTED Final   Parainfluenza Virus 1 NOT DETECTED NOT DETECTED Final   Parainfluenza Virus 2 NOT DETECTED NOT DETECTED Final   Parainfluenza Virus 3 NOT DETECTED NOT DETECTED Final   Parainfluenza Virus 4 NOT DETECTED NOT DETECTED Final   Respiratory Syncytial Virus NOT DETECTED NOT DETECTED Final   Bordetella pertussis NOT DETECTED NOT DETECTED Final   Bordetella Parapertussis NOT DETECTED NOT DETECTED Final   Chlamydophila pneumoniae NOT DETECTED NOT DETECTED Final   Mycoplasma pneumoniae NOT DETECTED NOT DETECTED Final    Comment: Performed at [facility], [place] [address] [place], [place] [address]         Radiology Studies: DG CHEST PORT 1 VIEW  Result Date: 11/03/2021 CLINICAL DATA:   Aspiration into airway EXAM: PORTABLE CHEST 1 VIEW COMPARISON:  11/02/2021 FINDINGS: Normal heart size and mediastinal contours. Prior median sternotomy. Low volume chest. No acute infiltrate or edema. No effusion or pneumothorax. No acute osseous findings. IMPRESSION: No evidence of active disease. Electronically Signed   By: Jorje Guild M.D.   On: 11/03/2021 06:28   CT HEAD WO CONTRAST (5MM)  Result Date: 11/03/2021 CLINICAL DATA:  Transient ischemic attack.  History of stroke. EXAM: CT HEAD WITHOUT CONTRAST TECHNIQUE: Contiguous axial images were obtained from the base of the skull through the vertex without intravenous contrast. RADIATION DOSE REDUCTION: This exam was performed according to the departmental dose-optimization program which includes automated exposure control, adjustment of the mA and/or kV according to patient size and/or use of iterative reconstruction technique. COMPARISON:  08/29/2021. FINDINGS: Brain: No acute intracranial hemorrhage, midline shift or mass effect. No extra-axial fluid collection. Diffuse atrophy is noted. Periventricular white matter hypodensities are noted bilaterally. There are stable hypodensities and encephalomalacia in the cerebellar hemispheres, left temporal lobe, basal ganglia bilaterally, and left frontal lobe. No hydrocephalus. Vascular: Atherosclerotic calcification of the carotid siphons and vertebral arteries. No hyperdense vessel. Skull: Normal. Negative for fracture or focal lesion. Sinuses/Orbits: No acute finding. Other: None. IMPRESSION: 1. No acute intracranial process. 2. Atrophy with chronic microvascular ischemic changes and old infarcts. Electronically Signed   By: Brett Fairy M.D.   On: 11/03/2021 03:44   DG Abd 1 View  Result Date: 11/02/2021 CLINICAL DATA:  Vomiting. EXAM: ABDOMEN - 1 VIEW COMPARISON:  08/29/2021. FINDINGS: The bowel gas pattern is normal. Surgical clips are present in the right upper quadrant. No radio-opaque calculi.  Vascular calcifications are noted. IMPRESSION: No evidence of bowel obstruction. Electronically Signed   By: Brett Fairy M.D.   On: 11/02/2021 22:45   CT Chest Wo Contrast  Result Date: 11/02/2021 CLINICAL DATA:  51 year old female with shortness of breath and increasing cough. Recent treatment for pneumonia. Negative for COVID-19. EXAM: CT CHEST WITHOUT CONTRAST TECHNIQUE: Multidetector CT imaging of the chest was performed following the standard protocol without IV contrast. RADIATION DOSE REDUCTION: This exam was performed according to the departmental dose-optimization program which includes automated exposure control, adjustment of the mA and/or kV according to patient size and/or use of iterative reconstruction technique. COMPARISON:  Chest radiographs 0222 hours today and earlier. CT Abdomen and Pelvis 07/10/2021. FINDINGS: Cardiovascular:  Repeat series 6 images are relatively motion free. 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 Extensive Calcified aortic atherosclerosis. Vascular patency is not evaluated in the absence of IV contrast. Aberrant origin of the right subclavian artery, normal variant. No cardiomegaly or pericardial effusion. Calcified atherosclerosis of the visible cervical carotid arteries. Mediastinum/Nodes: Negative noncontrast appearance of the mediastinum. Lungs/Pleura: Respiratory motion despite repeated imaging attempts. Major airways remain patent. There is mild platelike peribronchial opacity in both lower lobes. Additional dependent opacity in the posterior basal segment of the left lower lobe. Azygous lobe and fissure, normal variant. No pleural effusion no other abnormal pulmonary opacity. Upper Abdomen: Negative visible noncontrast liver, spleen, stomach. Musculoskeletal: Prior sternotomy. Osteopenia. No acute osseous abnormality identified. IMPRESSION: 1. Mild motion artifact. Mild bilateral lower lobe pulmonary opacity more resembles atelectasis than infection. No pleural  effusion. 2. Aortic Atherosclerosis (ICD10-I70.0). Prior sternotomy, possibly CABG. Electronically Signed   By: Genevie Ann M.D.   On: 11/02/2021 07:48   DG Chest Port 1 View  Result Date: 11/02/2021 CLINICAL DATA:  Dyspnea EXAM: PORTABLE CHEST 1 VIEW COMPARISON:  Radiographs 10/16/2021 FINDINGS: Low lung volumes. No focal consolidation, pleural effusion, or pneumothorax. Normal cardiomediastinal silhouette. No acute osseous abnormality. Azygous fissure. Sternotomy and mediastinal clips. IMPRESSION: No active disease. Electronically Signed   By: Placido Sou M.D.   On: 11/02/2021 02:36        Scheduled Meds:  atorvastatin  80 mg Oral Daily   budesonide (PULMICORT) nebulizer solution  0.5 mg Nebulization BID   Chlorhexidine Gluconate Cloth  6 each Topical Daily   clopidogrel  75 mg Oral Daily   enoxaparin (LOVENOX) injection  40 mg Subcutaneous QHS   escitalopram  20 mg Oral Daily   ezetimibe  10 mg Oral Daily   feeding supplement  237 mL Oral BID BM   gabapentin  100 mg Oral TID   guaiFENesin  1,200 mg Oral BID   hydrALAZINE  10 mg Intravenous Once   insulin aspart  0-15 Units Subcutaneous TID WC   insulin glargine-yfgn  25 Units Subcutaneous QHS   ipratropium  0.5 mg Nebulization TID   labetalol  20 mg Intravenous Once   levalbuterol  0.63 mg Nebulization BID   melatonin  5 mg Oral QHS   methimazole  5 mg Oral Daily   metoCLOPramide (REGLAN) injection  5 mg Intravenous Q6H   metoprolol tartrate  100 mg Oral BID   pantoprazole  40 mg Oral Daily   predniSONE  40 mg Oral Q breakfast   tamsulosin  0.4 mg Oral QPC supper   topiramate  25 mg Oral BID   Continuous Infusions:  sodium chloride 100 mL/hr at 11/03/21 1258   cefTRIAXone (ROCEPHIN)  IV Stopped (11/03/21 0902)   [START ON 11/04/2021] fluconazole (DIFLUCAN) IV     niCARdipine in saline Stopped (11/03/21 1105)     LOS: 0 days    Time spent: 40 minutes    Irine Seal, MD [facility]   To contact the  attending provider between 7A-7P or the covering provider during after hours 7P-7A, please log into the web site [email or web address] and access using universal [place] password for that web site. If you do not have the password, please call the hospital operator.  11/03/2021, 3:16 PM

## 2021-11-03 NOTE — Progress Notes (Signed)
Rapid Response Event Note   Reason for Call : RN concerned pt is having a stroke.   Initial Focused Assessment: Pt alert and oriented.  But, very aphasic.  Pt has had a stroke in the past.  NIH complete.  VS in Flow sheet.  Pt does have some residual deficits on her left side but doesn't impede her ability to follow commands. Pt has bil. Breath sounds decreased in bases.  Pulses intact.  Pt on room RA.  Pt did c/o HA rated 6/10. CBG complete.  Pt has had multiple bouts of nausea and emesis.  Suction at the bedside.  Pt last know well time would have been about 0730 am as to day nurse feels pt speech was not intact during the day.  However, pt speech improved during my encounter with her.  Interventions: Provider at the bedside and didn't feel it was an active stroke.  CT to be done after pt nausea has stopped.  Numerous orders received and initiated see MAR.   Provider remained at the bedside as orders were being completed.     Plan of Care: Pt will transfer to [facility] for a higher level of care.   Event Summary:   MD Notified: yes Call Time: 2056 Arrival Time: 2100 End Time: 2215  Dyann Ruddle, RN

## 2021-11-04 DIAGNOSIS — R051 Acute cough: Secondary | ICD-10-CM | POA: Diagnosis not present

## 2021-11-04 DIAGNOSIS — I2510 Atherosclerotic heart disease of native coronary artery without angina pectoris: Secondary | ICD-10-CM | POA: Diagnosis not present

## 2021-11-04 DIAGNOSIS — R0600 Dyspnea, unspecified: Secondary | ICD-10-CM | POA: Diagnosis not present

## 2021-11-04 DIAGNOSIS — F419 Anxiety disorder, unspecified: Secondary | ICD-10-CM | POA: Diagnosis not present

## 2021-11-04 LAB — BASIC METABOLIC PANEL
Anion gap: 7 (ref 5–15)
BUN: 18 mg/dL (ref 6–20)
CO2: 18 mmol/L — ABNORMAL LOW (ref 22–32)
Calcium: 8 mg/dL — ABNORMAL LOW (ref 8.9–10.3)
Chloride: 112 mmol/L — ABNORMAL HIGH (ref 98–111)
Creatinine, Ser: 0.64 mg/dL (ref 0.44–1.00)
GFR, Estimated: >60 mL/min (ref >60)
Glucose, Bld: 233 mg/dL — ABNORMAL HIGH (ref 70–99)
Potassium: 3.9 mmol/L (ref 3.5–5.1)
Sodium: 137 mmol/L (ref 135–145)

## 2021-11-04 LAB — CBC WITH DIFFERENTIAL/PLATELET
Abs Immature Granulocytes: 0.08 10*3/uL — ABNORMAL HIGH (ref 0.00–0.07)
Basophils Absolute: 0 10*3/uL (ref 0.0–0.1)
Basophils Relative: 0 %
Eosinophils Absolute: 0 10*3/uL (ref 0.0–0.5)
Eosinophils Relative: 0 %
HCT: 37.9 % (ref 36.0–46.0)
Hemoglobin: 11.8 g/dL — ABNORMAL LOW (ref 12.0–15.0)
Immature Granulocytes: 1 %
Lymphocytes Relative: 7 %
Lymphs Abs: 1.2 10*3/uL (ref 0.7–4.0)
MCH: 25.5 pg — ABNORMAL LOW (ref 26.0–34.0)
MCHC: 31.1 g/dL (ref 30.0–36.0)
MCV: 82 fL (ref 80.0–100.0)
Monocytes Absolute: 0.5 10*3/uL (ref 0.1–1.0)
Monocytes Relative: 3 %
Neutro Abs: 15 10*3/uL — ABNORMAL HIGH (ref 1.7–7.7)
Neutrophils Relative %: 89 %
Platelets: 292 10*3/uL (ref 150–400)
RBC: 4.62 MIL/uL (ref 3.87–5.11)
RDW: 16.3 % — ABNORMAL HIGH (ref 11.5–15.5)
WBC: 16.8 10*3/uL — ABNORMAL HIGH (ref 4.0–10.5)
nRBC: 0 % (ref 0.0–0.2)

## 2021-11-04 LAB — URINE CULTURE

## 2021-11-04 LAB — GLUCOSE, CAPILLARY
Glucose-Capillary: 212 mg/dL — ABNORMAL HIGH (ref 70–99)
Glucose-Capillary: 189 mg/dL — ABNORMAL HIGH (ref 70–99)
Glucose-Capillary: 178 mg/dL — ABNORMAL HIGH (ref 70–99)
Glucose-Capillary: 178 mg/dL — ABNORMAL HIGH (ref 70–99)

## 2021-11-04 LAB — MAGNESIUM: Magnesium: 2.2 mg/dL (ref 1.7–2.4)

## 2021-11-04 MED ORDER — LIP MEDEX EX OINT
1.0000 | TOPICAL_OINTMENT | CUTANEOUS | Status: DC | PRN
  Administered 2021-11-04: 1 via TOPICAL
  Filled 2021-11-04: qty 7

## 2021-11-04 MED ORDER — AMLODIPINE BESYLATE 10 MG PO TABS
10.0000 mg | ORAL_TABLET | Freq: Every day | ORAL | Status: DC
  Administered 2021-11-04 – 2021-11-07 (×4): 10 mg via ORAL
  Filled 2021-11-04 (×5): qty 1

## 2021-11-04 MED ORDER — TOPIRAMATE 25 MG PO TABS
25.0000 mg | ORAL_TABLET | Freq: Two times a day (BID) | ORAL | Status: DC
  Administered 2021-11-05 – 2021-11-07 (×6): 25 mg via ORAL
  Filled 2021-11-04 (×6): qty 1

## 2021-11-04 MED ORDER — HYDRALAZINE HCL 20 MG/ML IJ SOLN
10.0000 mg | Freq: Once | INTRAMUSCULAR | Status: AC
  Administered 2021-11-04: 10 mg via INTRAVENOUS
  Filled 2021-11-04: qty 1

## 2021-11-04 MED ORDER — HYDRALAZINE HCL 25 MG PO TABS
25.0000 mg | ORAL_TABLET | Freq: Two times a day (BID) | ORAL | Status: DC
  Administered 2021-11-04 – 2021-11-05 (×2): 25 mg via ORAL
  Filled 2021-11-04 (×2): qty 1

## 2021-11-04 MED ORDER — INSULIN GLARGINE-YFGN 100 UNIT/ML ~~LOC~~ SOLN
30.0000 [IU] | Freq: Every day | SUBCUTANEOUS | Status: DC
  Administered 2021-11-04 – 2021-11-07 (×4): 30 [IU] via SUBCUTANEOUS
  Filled 2021-11-04 (×5): qty 0.3

## 2021-11-04 MED ORDER — SODIUM BICARBONATE 650 MG PO TABS
650.0000 mg | ORAL_TABLET | Freq: Two times a day (BID) | ORAL | Status: AC
  Administered 2021-11-04 – 2021-11-05 (×4): 650 mg via ORAL
  Filled 2021-11-04 (×4): qty 1

## 2021-11-04 MED ORDER — IPRATROPIUM BROMIDE 0.02 % IN SOLN
0.5000 mg | Freq: Two times a day (BID) | RESPIRATORY_TRACT | Status: DC
  Administered 2021-11-04 – 2021-11-07 (×7): 0.5 mg via RESPIRATORY_TRACT
  Filled 2021-11-04 (×8): qty 2.5

## 2021-11-04 NOTE — Progress Notes (Signed)
PROGRESS NOTE    Lori [name]  SWN:[id number] DOB: Jun 21, 1970 DOA: 11/02/2021 PCP: [name], NP    Chief Complaint  Patient presents with   Respiratory Distress    Brief Narrative:  Patient is an unfortunate 51 year old female history of type 2 diabetes, CVA with residual left-sided hemiparesis, CAD, anxiety, hypertension, hyperlipidemia, GERD presented to the ED with cough x2 weeks, shortness of breath with no improvement despite nebs and steroids in the outpatient setting.  Patient had a referral to see pulmonology but symptoms worsened and presented to the ED.  Patient admitted with dyspnea and cough, placed on nebs and steroids.  CT chest done with bibasilar atelectasis versus pneumonia.  Procalcitonin was negative.  Urinalysis also concerning for UTI.  On hospital day 1 while in the stepdown unit due to concerns for neurological change and worsening aphasia head CT was obtained which was negative for any acute abnormalities.  Patient noted to have nausea and emesis on initial presentation.   Assessment & Plan:   Principal Problem:   Dyspnea Active Problems:   Essential hypertension   Diabetes (HCC)   Hyperthyroidism   CAD (coronary artery disease)   Anxiety and depression   HLD (hyperlipidemia)   CVA (cerebral vascular accident) ([place])   Cough   GERD (gastroesophageal reflux disease)  #1 dyspnea/cough -??  Questionable etiology. -CT chest with bibasilar atelectasis versus pneumonia. -Procalcitonin noted to be negative. -Patient with sats of 99 to 100% on room air. -Patient noted to have nausea and emesis the evening of 11/02/2021 with repeat chest x-ray 11/03/2021 unremarkable.  -Patient with some clinical improvement. -Continue home regimen Pulmicort. -Continue Atrovent and Xopenex nebs, steroid taper. -Continue Mucinex.   -Continue to hold ACE inhibitor as patient presented with cough. -Supportive care.  2.  Nausea/emesis -??  Etiology.??  Secondary to  UTI -Abdominal films negative for obstruction. -Patient stated to RN was on Reglan prior to admission and as such we will place on IV Reglan for now pending nausea and emesis. -Improving clinically. -Tolerated full liquid diet and advance to a heart healthy diet. -Has been assessed by SLP. -Continue IV antiemetics, IV fluids, supportive care.  3.  Dehydration -Decrease IV fluids to 75 cc an hour.    4.  UTI/candiduria -Urine cultures pending.   -C continue IV Rocephin, IV Diflucan due to concerns for candiduria.  -IV fluids, supportive care.    5.  Sinus tachycardia -Likely secondary to acute infection versus secondary to history of hyperthyroidism. -Sinus tachycardia improved on resumption of home regimen Lopressor. -Continue IV antibiotics for treatment of UTI. -IV fluids, supportive care.  6.  Hyperthyroidism -Continue home regimen Lopressor for rate control. -Continue methimazole. -Outpatient follow-up with PCP/endocrinology.  7.  Hypertensive urgency -Patient required a Cardene drip with improvement with blood pressure.   -Cardene drip has been weaned off.   -Continue Lopressor.  Increase Norvasc to 10 mg daily for better blood pressure control.    8.  Diabetes mellitus type 2 -Hemoglobin A1c 10.5 (10/23/2021) -CBG 212 this morning. -Increase Semglee to 30 units daily.  SSI.   -Continue home regimen Neurontin. -Per RN patient states on Reglan prior to meals and as such patient has been placed on IV Reglan 5 mg every 6 hours, once nausea and vomiting improves could change IV Reglan to oral Reglan.  9.  Hyperlipidemia -Continue statin, Zetia.    10.  History of CVA with left hemiparesis -Continue risk factor modification. -Continue Plavix, statin, Zetia.  11.  Anxiety -Continue home regimen Lexapro.  Ativan as needed.   12.  GERD -Continue PPI.   13.  CAD -Stable. -Continue Plavix, Lipitor, Zetia, Lopressor.     DVT prophylaxis: Lovenox Code Status:  Full Family Communication: Updated patient.  No family at bedside. Disposition: TBD  Status is: Inpatient The patient will require care spanning > 2 midnights and should be moved to inpatient because: Severity of illness   Consultants:  None  Procedures:  CT head 11/03/2021 CT chest 11/02/2021 Chest x-ray 11/03/2021   Antimicrobials:  IV Rocephin 11/03/2021 >>>>   Subjective: Overall feeling better.  Denies any chest pain. Respiratory history of admission.  Cough improving.  Abdominal pain improved.  Still with some dysuria.  No shortness of breath.  Abdominal pain improved.  Objective: Vitals:   11/04/21 0100 11/04/21 0400 11/04/21 0800 11/04/21 0801  BP: 103/65     Pulse: 76     Resp: 16     Temp:  (!) 97.3 F (36.3 C) 98 F (36.7 C)   TempSrc:  Axillary Oral   SpO2: 99%   100%  Weight:      Height:        Intake/Output Summary (Last 24 hours) at 11/04/2021 0925 Last data filed at 11/04/2021 0809 Gross per 24 hour  Intake 2513.75 ml  Output 2050 ml  Net 463.75 ml    Filed Weights   11/02/21 0109 11/02/21 2300  Weight: 81.6 kg 84.1 kg    Examination:  General exam: NAD Respiratory system: Decreased bibasilar coarse breath sounds.  No wheezing. Fair air movement. Cardiovascular system: Regular rate rhythm no murmurs rubs or gallops.  No JVD.  No lower extremity edema.   Gastrointestinal system: Abdomen is soft, nondistended, decreased tenderness to palpation in the suprapubic region.  Positive bowel sounds.  No rebound.  No guarding.   Central nervous system: Alert and oriented.  Dysarthric speech.  Residual chronic left upper extremity weakness.  Extremities: Left upper extremity weakness.  Skin: No rashes, lesions or ulcers Psychiatry: Judgement and insight appear normal. Mood & affect appropriate.     Data Reviewed: I have personally reviewed following labs and imaging studies  CBC: Recent Labs  Lab 11/02/21 0146 11/02/21 2329 11/03/21 0451  11/04/21 0317  WBC 9.4 13.2* 17.2* 16.8*  NEUTROABS 6.2  --   --  15.0*  HGB 12.6 12.9 13.1 11.8*  HCT 39.2 39.0 40.3 37.9  MCV 79.4* 78.2* 79.3* 82.0  PLT 361 323 304 292     Basic Metabolic Panel: Recent Labs  Lab 11/02/21 0146 11/02/21 2329 11/03/21 0451 11/03/21 0811 11/04/21 0317  NA 140 137 138  --  137  K 3.6 3.7 4.0  --  3.9  CL 106 107 106  --  112*  CO2 25 20* 20*  --  18*  GLUCOSE 210* 213* 232*  --  233*  BUN '9 11 10  '$ --  18  CREATININE 0.68 0.68 0.63  --  0.64  CALCIUM 8.5* 8.7* 8.6*  --  8.0*  MG  --  1.7  --  1.7 2.2     GFR: Estimated Creatinine Clearance: 85.5 mL/min (by C-G formula based on SCr of 0.64 mg/dL).  Liver Function Tests: Recent Labs  Lab 11/02/21 0146 11/02/21 2329 11/03/21 0451  AST 12* 11* 15  ALT '15 13 12  '$ ALKPHOS 77 77 77  BILITOT 0.5 1.0 1.0  PROT 6.9 7.5 7.2  ALBUMIN 3.3* 3.6 3.4*     CBG: Recent  Labs  Lab 11/03/21 0812 11/03/21 1204 11/03/21 1616 11/03/21 2147 11/04/21 0737  GLUCAP 262* 218* 184* 170* 212*      Recent Results (from the past 240 hour(s))  Resp Panel by RT-PCR (Flu A&B, Covid) Anterior Nasal Swab     Status: None   Collection Time: 11/02/21  1:46 AM   Specimen: Anterior Nasal Swab  Result Value Ref Range Status   SARS Coronavirus 2 by RT PCR NEGATIVE NEGATIVE Final    Comment: (NOTE) SARS-CoV-2 target nucleic acids are NOT DETECTED.  The SARS-CoV-2 RNA is generally detectable in upper respiratory specimens during the acute phase of infection. The lowest concentration of SARS-CoV-2 viral copies this assay can detect is 138 copies/mL. A negative result does not preclude SARS-Cov-2 infection and should not be used as the sole basis for treatment or other patient management decisions. A negative result may occur with  improper specimen collection/handling, submission of specimen other than nasopharyngeal swab, presence of viral mutation(s) within the areas targeted by this assay, and  inadequate number of viral copies(<138 copies/mL). A negative result must be combined with clinical observations, patient history, and epidemiological information. The expected result is Negative.  Fact Sheet for Patients:  EntrepreneurPulse.com.au  Fact Sheet for Healthcare Providers:  IncredibleEmployment.be  This test is no t yet approved or cleared by the [place] FDA and  has been authorized for detection and/or diagnosis of SARS-CoV-2 by FDA under an Emergency Use Authorization (EUA). This EUA will remain  in effect (meaning this test can be used) for the duration of the COVID-19 declaration under Section 564(b)(1) of the Act, 21 U.S.C.section 360bbb-3(b)(1), unless the authorization is terminated  or revoked sooner.       Influenza A by PCR NEGATIVE NEGATIVE Final   Influenza B by PCR NEGATIVE NEGATIVE Final    Comment: (NOTE) The Xpert Xpress SARS-CoV-2/FLU/RSV plus assay is intended as an aid in the diagnosis of influenza from Nasopharyngeal swab specimens and should not be used as a sole basis for treatment. Nasal washings and aspirates are unacceptable for Xpert Xpress SARS-CoV-2/FLU/RSV testing.  Fact Sheet for Patients: EntrepreneurPulse.com.au  Fact Sheet for Healthcare Providers: IncredibleEmployment.be  This test is not yet approved or cleared by the [place] FDA and has been authorized for detection and/or diagnosis of SARS-CoV-2 by FDA under an Emergency Use Authorization (EUA). This EUA will remain in effect (meaning this test can be used) for the duration of the COVID-19 declaration under Section 564(b)(1) of the Act, 21 U.S.C. section 360bbb-3(b)(1), unless the authorization is terminated or revoked.  Performed at [facility], [place] [address], [place], [place] [address]   MRSA Next Gen by PCR, Nasal     Status: None   Collection Time: 11/02/21  5:10  AM   Specimen: Nasal Mucosa; Nasal Swab  Result Value Ref Range Status   MRSA by PCR Next Gen NOT DETECTED NOT DETECTED Final    Comment: (NOTE) The GeneXpert MRSA Assay (FDA approved for NASAL specimens only), is one component of a comprehensive MRSA colonization surveillance program. It is not intended to diagnose MRSA infection nor to guide or monitor treatment for MRSA infections. Test performance is not FDA approved in patients less than 74 years old. Performed at [facility], [place] [address] [place], [place] [address]   Respiratory (~20 pathogens) panel by PCR     Status: None   Collection Time: 11/02/21  6:05 PM   Specimen: Nasopharyngeal Swab; Respiratory  Result Value  Ref Range Status   Adenovirus NOT DETECTED NOT DETECTED Final   Coronavirus 229E NOT DETECTED NOT DETECTED Final    Comment: (NOTE) The Coronavirus on the Respiratory Panel, DOES NOT test for the novel  Coronavirus (2019 nCoV)    Coronavirus HKU1 NOT DETECTED NOT DETECTED Final   Coronavirus NL63 NOT DETECTED NOT DETECTED Final   Coronavirus OC43 NOT DETECTED NOT DETECTED Final   Metapneumovirus NOT DETECTED NOT DETECTED Final   Rhinovirus / Enterovirus NOT DETECTED NOT DETECTED Final   Influenza A NOT DETECTED NOT DETECTED Final   Influenza B NOT DETECTED NOT DETECTED Final   Parainfluenza Virus 1 NOT DETECTED NOT DETECTED Final   Parainfluenza Virus 2 NOT DETECTED NOT DETECTED Final   Parainfluenza Virus 3 NOT DETECTED NOT DETECTED Final   Parainfluenza Virus 4 NOT DETECTED NOT DETECTED Final   Respiratory Syncytial Virus NOT DETECTED NOT DETECTED Final   Bordetella pertussis NOT DETECTED NOT DETECTED Final   Bordetella Parapertussis NOT DETECTED NOT DETECTED Final   Chlamydophila pneumoniae NOT DETECTED NOT DETECTED Final   Mycoplasma pneumoniae NOT DETECTED NOT DETECTED Final    Comment: Performed at [facility], [place] [address] [place], [place] [address]          Radiology Studies: DG CHEST PORT 1 VIEW  Result Date: 11/03/2021 CLINICAL DATA:  Aspiration into airway EXAM: PORTABLE CHEST 1 VIEW COMPARISON:  11/02/2021 FINDINGS: Normal heart size and mediastinal contours. Prior median sternotomy. Low volume chest. No acute infiltrate or edema. No effusion or pneumothorax. No acute osseous findings. IMPRESSION: No evidence of active disease. Electronically Signed   By: Jorje Guild M.D.   On: 11/03/2021 06:28   CT HEAD WO CONTRAST (5MM)  Result Date: 11/03/2021 CLINICAL DATA:  Transient ischemic attack.  History of stroke. EXAM: CT HEAD WITHOUT CONTRAST TECHNIQUE: Contiguous axial images were obtained from the base of the skull through the vertex without intravenous contrast. RADIATION DOSE REDUCTION: This exam was performed according to the departmental dose-optimization program which includes automated exposure control, adjustment of the mA and/or kV according to patient size and/or use of iterative reconstruction technique. COMPARISON:  08/29/2021. FINDINGS: Brain: No acute intracranial hemorrhage, midline shift or mass effect. No extra-axial fluid collection. Diffuse atrophy is noted. Periventricular white matter hypodensities are noted bilaterally. There are stable hypodensities and encephalomalacia in the cerebellar hemispheres, left temporal lobe, basal ganglia bilaterally, and left frontal lobe. No hydrocephalus. Vascular: Atherosclerotic calcification of the carotid siphons and vertebral arteries. No hyperdense vessel. Skull: Normal. Negative for fracture or focal lesion. Sinuses/Orbits: No acute finding. Other: None. IMPRESSION: 1. No acute intracranial process. 2. Atrophy with chronic microvascular ischemic changes and old infarcts. Electronically Signed   By: Brett Fairy M.D.   On: 11/03/2021 03:44   DG Abd 1 View  Result Date: 11/02/2021 CLINICAL DATA:  Vomiting. EXAM: ABDOMEN - 1 VIEW COMPARISON:  08/29/2021. FINDINGS: The bowel gas  pattern is normal. Surgical clips are present in the right upper quadrant. No radio-opaque calculi. Vascular calcifications are noted. IMPRESSION: No evidence of bowel obstruction. Electronically Signed   By: Brett Fairy M.D.   On: 11/02/2021 22:45        Scheduled Meds:  amLODipine  5 mg Oral Daily   atorvastatin  80 mg Oral Daily   budesonide (PULMICORT) nebulizer solution  0.5 mg Nebulization BID   Chlorhexidine Gluconate Cloth  6 each Topical Daily   clopidogrel  75 mg Oral Daily   enoxaparin (LOVENOX) injection  40  mg Subcutaneous QHS   escitalopram  20 mg Oral Daily   ezetimibe  10 mg Oral Daily   feeding supplement  237 mL Oral BID BM   gabapentin  100 mg Oral TID   guaiFENesin  1,200 mg Oral BID   hydrALAZINE  10 mg Intravenous Once   insulin aspart  0-15 Units Subcutaneous TID WC   insulin glargine-yfgn  30 Units Subcutaneous QHS   ipratropium  0.5 mg Nebulization BID   labetalol  20 mg Intravenous Once   levalbuterol  0.63 mg Nebulization BID   melatonin  5 mg Oral QHS   methimazole  5 mg Oral Daily   metoCLOPramide (REGLAN) injection  5 mg Intravenous Q6H   metoprolol tartrate  100 mg Oral BID   pantoprazole  40 mg Oral Daily   predniSONE  40 mg Oral Q breakfast   tamsulosin  0.4 mg Oral QPC supper   topiramate  25 mg Oral BID   Continuous Infusions:  sodium chloride 100 mL/hr at 11/04/21 0809   cefTRIAXone (ROCEPHIN)  IV 2 g (11/04/21 0830)   fluconazole (DIFLUCAN) IV Stopped (11/04/21 0801)   niCARdipine in saline Stopped (11/03/21 1105)     LOS: 1 day    Time spent: 40 minutes    Irine Seal, MD [facility]   To contact the attending provider between 7A-7P or the covering provider during after hours 7P-7A, please log into the web site [email or web address] and access using universal [place] password for that web site. If you do not have the password, please call the hospital operator.  11/04/2021, 9:25 AM

## 2021-11-05 DIAGNOSIS — F419 Anxiety disorder, unspecified: Secondary | ICD-10-CM | POA: Diagnosis not present

## 2021-11-05 DIAGNOSIS — R051 Acute cough: Secondary | ICD-10-CM | POA: Diagnosis not present

## 2021-11-05 DIAGNOSIS — I2510 Atherosclerotic heart disease of native coronary artery without angina pectoris: Secondary | ICD-10-CM | POA: Diagnosis not present

## 2021-11-05 DIAGNOSIS — R0600 Dyspnea, unspecified: Secondary | ICD-10-CM | POA: Diagnosis not present

## 2021-11-05 LAB — GLUCOSE, CAPILLARY
Glucose-Capillary: 122 mg/dL — ABNORMAL HIGH (ref 70–99)
Glucose-Capillary: 205 mg/dL — ABNORMAL HIGH (ref 70–99)
Glucose-Capillary: 136 mg/dL — ABNORMAL HIGH (ref 70–99)
Glucose-Capillary: 118 mg/dL — ABNORMAL HIGH (ref 70–99)

## 2021-11-05 LAB — CBC WITH DIFFERENTIAL/PLATELET
Abs Immature Granulocytes: 0.06 10*3/uL (ref 0.00–0.07)
Basophils Absolute: 0 10*3/uL (ref 0.0–0.1)
Basophils Relative: 0 %
Eosinophils Absolute: 0 10*3/uL (ref 0.0–0.5)
Eosinophils Relative: 0 %
HCT: 38.3 % (ref 36.0–46.0)
Hemoglobin: 12.5 g/dL (ref 12.0–15.0)
Immature Granulocytes: 1 %
Lymphocytes Relative: 24 %
Lymphs Abs: 2.9 10*3/uL (ref 0.7–4.0)
MCH: 25.7 pg — ABNORMAL LOW (ref 26.0–34.0)
MCHC: 32.6 g/dL (ref 30.0–36.0)
MCV: 78.6 fL — ABNORMAL LOW (ref 80.0–100.0)
Monocytes Absolute: 0.9 10*3/uL (ref 0.1–1.0)
Monocytes Relative: 8 %
Neutro Abs: 7.9 10*3/uL — ABNORMAL HIGH (ref 1.7–7.7)
Neutrophils Relative %: 67 %
Platelets: 318 10*3/uL (ref 150–400)
RBC: 4.87 MIL/uL (ref 3.87–5.11)
RDW: 16 % — ABNORMAL HIGH (ref 11.5–15.5)
WBC: 11.7 10*3/uL — ABNORMAL HIGH (ref 4.0–10.5)
nRBC: 0 % (ref 0.0–0.2)

## 2021-11-05 LAB — BASIC METABOLIC PANEL
Anion gap: 6 (ref 5–15)
BUN: 20 mg/dL (ref 6–20)
CO2: 20 mmol/L — ABNORMAL LOW (ref 22–32)
Calcium: 8.3 mg/dL — ABNORMAL LOW (ref 8.9–10.3)
Chloride: 112 mmol/L — ABNORMAL HIGH (ref 98–111)
Creatinine, Ser: 0.69 mg/dL (ref 0.44–1.00)
GFR, Estimated: >60 mL/min (ref >60)
Glucose, Bld: 113 mg/dL — ABNORMAL HIGH (ref 70–99)
Potassium: 3.3 mmol/L — ABNORMAL LOW (ref 3.5–5.1)
Sodium: 138 mmol/L (ref 135–145)

## 2021-11-05 LAB — MAGNESIUM: Magnesium: 2.1 mg/dL (ref 1.7–2.4)

## 2021-11-05 MED ORDER — FLUCONAZOLE 100 MG PO TABS
200.0000 mg | ORAL_TABLET | Freq: Every day | ORAL | Status: DC
  Administered 2021-11-06 – 2021-11-07 (×2): 200 mg via ORAL
  Filled 2021-11-05 (×2): qty 2

## 2021-11-05 MED ORDER — SODIUM CHLORIDE 0.9 % IV BOLUS: 500.0000 mL | Freq: Once | INTRAVENOUS | Status: DC

## 2021-11-05 NOTE — Progress Notes (Signed)
SLP Cancellation Note  Patient Details Name: Lori [name] MRN: [id number] DOB: 1970/12/19   Cancelled treatment:       Reason Eval/Treat Not Completed: Patient's level of consciousness. Patient had been given Ativan by RN and when SLP entered room she was too drowsy to safely take PO's. Of note, patient is known to SLP department from previous admissions including AIR. She has not adhered to prior recommendations by SLP for nectar thick liquids. SLP will attempt to re-educate patient on swallow precautions and recommendations during next visit.    Sonia Baller, MA, CCC-SLP Speech Therapy

## 2021-11-05 NOTE — Evaluation (Signed)
Occupational Therapy Evaluation Patient Details Name: Lori [name] MRN: [id number] DOB: 01-30-1971 Today's Date: 11/05/2021   History of Present Illness 51 y.o. female admitted 11/02/21 with cough x2 weeks, SOB with no improvement despite nebs and steroids in the outpatient setting, while admitted experienced nausea and emesis, neurological change. CT negative for acute abnormalities. PMH includes DM2, CVA w/ left side hemiparesis, CAD, anxiety, HTN, HLD, GERD.   Clinical Impression   Pt is typically performing pivot transfers with assist from caregivers to [facility], does walk with therapy (was getting HHPT and OT), husband and caregiver assist with ADL, dependent in IADL. Today she presents with decreased seated balance, strength, and overall activity tolerance. Mod A +2 for bed mobility, min A +2 for transfers and steps up the bed for repositioning. OT will continue to follow acutely and return home with [facility] services to maximize safety and independence. Next session to focus on OOB to recliner (none present in ICU today or would have attempted).     Recommendations for follow up therapy are one component of a multi-disciplinary discharge planning process, led by the attending physician.  Recommendations may be updated based on patient status, additional functional criteria and insurance authorization.   Follow Up Recommendations  Home health OT (continue [facility] therapies)    Assistance Recommended at Discharge Frequent or constant Supervision/Assistance  Patient can return home with the following A lot of help with walking and/or transfers;A lot of help with bathing/dressing/bathroom;Assistance with cooking/housework;Direct supervision/assist for medications management;Direct supervision/assist for financial management;Assist for transportation;Help with stairs or ramp for entrance    Functional Status Assessment  Patient has had a recent decline in their functional status and demonstrates the  ability to make significant improvements in function in a reasonable and predictable amount of time.  Equipment Recommendations  None recommended by OT (Pt has approrpriate DME)    Recommendations for Other Services PT consult     Precautions / Restrictions Precautions Precautions: Fall Precaution Comments: L side hemi prior CVA Restrictions Weight Bearing Restrictions: No      Mobility Bed Mobility Overal bed mobility: Needs Assistance Bed Mobility: Supine to Sit     Supine to sit: Mod assist, +2 for physical assistance, +2 for safety/equipment, HOB elevated     General bed mobility comments: Asssit for trunk and bil LEs. Utilized bedpad for scooting, positioning at EOB. Increased time. Cues provided.    Transfers Overall transfer level: Needs assistance Equipment used: Rolling walker (2 wheels) Transfers: Sit to/from Stand Sit to Stand: Min assist, +2 safety/equipment, From elevated surface           General transfer comment: Assist to rise, steady, control descent. Cues for safety, hand placement.      Balance Overall balance assessment: Needs assistance, History of Falls Sitting-balance support: Feet unsupported, Bilateral upper extremity supported Sitting balance-Leahy Scale: Fair Sitting balance - Comments: from high ICU bed, cannot get legs onto floor.   Standing balance support: Bilateral upper extremity supported, During functional activity, Reliant on assistive device for balance Standing balance-Leahy Scale: Poor                             ADL either performed or assessed with clinical judgement   ADL Overall ADL's : Needs assistance/impaired Eating/Feeding: Set up;Sitting   Grooming: Set up;Wash/dry face;Sitting Grooming Details (indicate cue type and reason): EOB Upper Body Bathing: Moderate assistance   Lower Body Bathing: Maximal assistance   Upper Body  Dressing : Moderate assistance   Lower Body Dressing: Maximal assistance    Toilet Transfer: Minimal assistance;+2 for physical assistance;+2 for safety/equipment;Stand-pivot;BSC/3in1;Rolling walker (2 wheels)   Toileting- Clothing Manipulation and Hygiene: Maximal assistance;Sit to/from stand;+2 for safety/equipment       Functional mobility during ADLs: Minimal assistance;+2 for safety/equipment;Rolling walker (2 wheels)       Vision Baseline Vision/History: 1 Wears glasses Ability to See in Adequate Light: 0 Adequate Patient Visual Report: No change from baseline Vision Assessment?: No apparent visual deficits     Perception     Praxis      Pertinent Vitals/Pain Pain Assessment Pain Assessment: No/denies pain Faces Pain Scale: No hurt     Hand Dominance Right   Extremity/Trunk Assessment Upper Extremity Assessment Upper Extremity Assessment: LUE deficits/detail LUE Deficits / Details: residual hemiparesis. Pt has been working with OT at home on functional aspects. limited movement at digits, elbow, shoulder overall 3/5 LUE Coordination: decreased gross motor;decreased fine motor (at baseline)   Lower Extremity Assessment Lower Extremity Assessment: Defer to PT evaluation LLE Deficits / Details: residual hemiparesis LLE Coordination: decreased fine motor;decreased gross motor   Cervical / Trunk Assessment Cervical / Trunk Assessment: Kyphotic   Communication Communication Communication: Expressive difficulties (chronic)   Cognition Arousal/Alertness: Awake/alert Behavior During Therapy: WFL for tasks assessed/performed Overall Cognitive Status: Within Functional Limits for tasks assessed                                 General Comments: baseline     General Comments  VSS throughout session    Exercises     Shoulder Instructions      Home Living Family/patient expects to be discharged to:: Private residence Living Arrangements: Spouse/significant other Available Help at Discharge: Family;Available  PRN/intermittently;Personal care attendant (7 days a week/3 hours) Type of Home: Apartment Home Access: Stairs to enter Entrance Stairs-Number of Steps: 5   Home Layout: One level     Bathroom Shower/Tub: Chief [employer] Officer: Rollator (4 wheels);Hand held shower head;Hospital bed;Shower seat;BSC/3in1   Additional Comments: patient reported having an aid who assists 3 hours a day and getting HHPT/OT prior to adm      Prior Functioning/Environment Prior Level of Function : Needs assist             Mobility Comments: per pt she transfers/ambulates with therapy; aide assists with household tasks, ADLs. pt husband works days. ADLs Comments: needs assistance with all ADLs. patient reported that she is home alone at times during the day while husband works.        OT Problem List: Decreased activity tolerance;Impaired balance (sitting and/or standing);Cardiopulmonary status limiting activity;Impaired UE functional use;Increased edema      OT Treatment/Interventions: Self-care/ADL training;Therapeutic exercise;DME and/or AE instruction;Therapeutic activities;Patient/family education;Balance training    OT Goals(Current goals can be found in the care plan section) Acute Rehab OT Goals Patient Stated Goal: get back home, continue therapy OT Goal Formulation: With patient Time For Goal Achievement: 11/19/21 Potential to Achieve Goals: Good ADL Goals Pt Will Perform Eating: with modified independence;sitting Pt Will Perform Grooming: with set-up;sitting Pt Will Transfer to Toilet: with min assist;stand pivot transfer;bedside commode Additional ADL Goal #1: Pt will perform bed mobility at supervision level prior to engaging in ADL  OT Frequency: Min 2X/week    Co-evaluation  AM-PAC OT "6 Clicks" Daily Activity     Outcome Measure Help from another person eating meals?: A Lot Help from another person taking care of personal grooming?: A  Lot Help from another person toileting, which includes using toliet, bedpan, or urinal?: A Lot Help from another person bathing (including washing, rinsing, drying)?: A Lot Help from another person to put on and taking off regular upper body clothing?: A Lot Help from another person to put on and taking off regular lower body clothing?: A Lot 6 Click Score: 12   End of Session Equipment Utilized During Treatment: Gait belt;Rolling walker (2 wheels) Nurse Communication: Mobility status  Activity Tolerance: Patient tolerated treatment well Patient left: in bed;with call bell/phone within reach;with bed alarm set  OT Visit Diagnosis: Unsteadiness on feet (R26.81);Other abnormalities of gait and mobility (R26.89);Muscle weakness (generalized) (M62.81)                Time: 6213-0865 OT Time Calculation (min): 19 min Charges:  OT General Charges $OT Visit: 1 Visit OT Evaluation $OT Eval Moderate Complexity: [place] OTR/L Acute Rehabilitation Services Office: [place] 11/05/2021, 11:35 AM

## 2021-11-05 NOTE — Evaluation (Signed)
Physical Therapy Evaluation Patient Details Name: Lori [name] MRN: [id number] DOB: Jun 16, 1970 Today's Date: 11/05/2021  History of Present Illness  51 y.o. female admitted 11/02/21 with cough x2 weeks, SOB with no improvement despite nebs and steroids in the outpatient setting, while admitted experienced nausea and emesis, neurological change. CT negative for acute abnormalities. PMH includes DM2, CVA w/ left side hemiparesis, CAD, anxiety, HTN, HLD, GERD.  Clinical Impression  On eval, pt required Mod A +2 for bed mobility and Min A +2 for safety for standing and side steps along bedside with a RW. Pt reports feeling tired on today. She tolerated session well. She was assisted back to bed at her request. No family was present during this session. Will plan to follow pt during this hospital stay.       Recommendations for follow up therapy are one component of a multi-disciplinary discharge planning process, led by the attending physician.  Recommendations may be updated based on patient status, additional functional criteria and insurance authorization.  Follow Up Recommendations Home health PT      Assistance Recommended at Discharge Frequent or constant Supervision/Assistance  Patient can return home with the following  Help with stairs or ramp for entrance;Assist for transportation;Assistance with cooking/housework;Direct supervision/assist for medications management;A little help with bathing/dressing/bathroom;A little help with walking and/or transfers    Equipment Recommendations None recommended by PT  Recommendations for Other Services       Functional Status Assessment Patient has had a recent decline in their functional status and demonstrates the ability to make significant improvements in function in a reasonable and predictable amount of time.     Precautions / Restrictions Precautions Precautions: Fall Precaution Comments: L side hemi prior CVA Restrictions Weight  Bearing Restrictions: No      Mobility  Bed Mobility Overal bed mobility: Needs Assistance Bed Mobility: Supine to Sit     Supine to sit: Mod assist, +2 for physical assistance, +2 for safety/equipment, HOB elevated     General bed mobility comments: Asssit for trunk and bil LEs. Utilized bedpad for scooting, positioning at EOB. Increased time. Cues provided.    Transfers Overall transfer level: Needs assistance Equipment used: Rolling walker (2 wheels) Transfers: Sit to/from Stand Sit to Stand: Min assist, +2 safety/equipment, From elevated surface           General transfer comment: Assist to rise, steady, control descent. Cues for safety, hand placement.    Ambulation/Gait Ambulation/Gait assistance: Min assist, +2 safety/equipment   Assistive device: Rolling walker (2 wheels)         General Gait Details: side steps along bedside with RW. pt had some L knee buckling. assist to stabilize throughout.  Stairs            Wheelchair Mobility    Modified Rankin (Stroke Patients Only)       Balance Overall balance assessment: Needs assistance, History of Falls         Standing balance support: Bilateral upper extremity supported, During functional activity, Reliant on assistive device for balance Standing balance-Leahy Scale: Poor                               Pertinent Vitals/Pain Pain Assessment Pain Assessment: No/denies pain    Home Living Family/patient expects to be discharged to:: Private residence Living Arrangements: Spouse/significant other Available Help at Discharge: Family;Available PRN/intermittently;Personal care attendant (7 days a week/3 hours) Type of Home: Apartment  Home Access: Stairs to enter   Entrance Stairs-Number of Steps: 5   Home Layout: One level Home Equipment: Rollator (4 wheels);Hand held shower head;Hospital bed;Shower seat;BSC/3in1      Prior Function Prior Level of Function : Needs assist              Mobility Comments: per pt she transfers/ambulates with therapy; aide assists with household tasks, ADLs. pt husband works days. ADLs Comments: needs assistance with all ADLs. patient reported that she is home alone at times during the day while husband works.     Hand Dominance   Dominant Hand: Right    Extremity/Trunk Assessment   Upper Extremity Assessment Upper Extremity Assessment: Defer to OT evaluation    Lower Extremity Assessment Lower Extremity Assessment: Generalized weakness;LLE deficits/detail LLE Deficits / Details: residual hemiparesis LLE Coordination: decreased fine motor;decreased gross motor    Cervical / Trunk Assessment Cervical / Trunk Assessment: Kyphotic  Communication   Communication: Expressive difficulties (chronic)  Cognition Arousal/Alertness: Awake/alert Behavior During Therapy: WFL for tasks assessed/performed Overall Cognitive Status: Within Functional Limits for tasks assessed                                          General Comments      Exercises     Assessment/Plan    PT Assessment Patient needs continued PT services  PT Problem List Decreased strength;Decreased mobility;Decreased range of motion;Decreased balance;Decreased activity tolerance;Decreased knowledge of use of DME;Decreased coordination;Impaired tone       PT Treatment Interventions DME instruction;Gait training;Therapeutic exercise;Patient/family education;Therapeutic activities;Balance training;Functional mobility training    PT Goals (Current goals can be found in the Care Plan section)  Acute Rehab PT Goals Patient Stated Goal: to return home PT Goal Formulation: With patient Time For Goal Achievement: 11/19/21 Potential to Achieve Goals: Good    Frequency Min 3X/week     Co-evaluation               AM-PAC PT "6 Clicks" Mobility  Outcome Measure Help needed turning from your back to your side while in a flat bed without  using bedrails?: A Lot Help needed moving from lying on your back to sitting on the side of a flat bed without using bedrails?: A Lot Help needed moving to and from a bed to a chair (including a wheelchair)?: A Lot Help needed standing up from a chair using your arms (e.g., wheelchair or bedside chair)?: A Lot Help needed to walk in hospital room?: A Lot Help needed climbing 3-5 steps with a railing? : A Lot 6 Click Score: 12    End of Session Equipment Utilized During Treatment: Gait belt Activity Tolerance: Patient tolerated treatment well Patient left: in bed;with call bell/phone within reach;with bed alarm set   PT Visit Diagnosis: Muscle weakness (generalized) (M62.81);Difficulty in walking, not elsewhere classified (R26.2);Other symptoms and signs involving the nervous system (Y10.175)    Time: 1025-8527 PT Time Calculation (min) (ACUTE ONLY): 19 min   Charges:   PT Evaluation $PT Eval Moderate Complexity: 1 Mod             Doreatha Massed, PT Acute Rehabilitation  Office: [phone] Pager: [phone]

## 2021-11-05 NOTE — Progress Notes (Addendum)
PROGRESS NOTE    Lori [name]  GDJ:[id number] DOB: 1970-03-16 DOA: 11/02/2021 PCP: [name], NP    Chief Complaint  Patient presents with   Respiratory Distress    Brief Narrative:  Patient is an unfortunate 51 year old female history of type 2 diabetes, CVA with residual left-sided hemiparesis, CAD, anxiety, hypertension, hyperlipidemia, GERD presented to the ED with cough x2 weeks, shortness of breath with no improvement despite nebs and steroids in the outpatient setting.  Patient had a referral to see pulmonology but symptoms worsened and presented to the ED.  Patient admitted with dyspnea and cough, placed on nebs and steroids.  CT chest done with bibasilar atelectasis versus pneumonia.  Procalcitonin was negative.  Urinalysis also concerning for UTI.  On hospital day 1 while in the stepdown unit due to concerns for neurological change and worsening aphasia head CT was obtained which was negative for any acute abnormalities.  Patient noted to have nausea and emesis on initial presentation.   Assessment & Plan:   Principal Problem:   Dyspnea Active Problems:   Essential hypertension   Diabetes (HCC)   Hyperthyroidism   CAD (coronary artery disease)   Anxiety and depression   HLD (hyperlipidemia)   CVA (cerebral vascular accident) ([place])   Cough   GERD (gastroesophageal reflux disease)  #1 dyspnea/cough -??  Questionable etiology. -CT chest with bibasilar atelectasis versus pneumonia. -Procalcitonin noted to be negative. -Patient with sats of 99 to 100% on room air. -Patient noted to have nausea and emesis the evening of 11/02/2021 with repeat chest x-ray 11/03/2021 unremarkable.  -Patient clinically improving.  -Continue home regimen Pulmicort. -Continue Atrovent and Xopenex nebs, steroid taper. -Continue Mucinex.   -Continue to hold ACE inhibitor as patient presented with cough and likely will not resume ACE inhibitor on discharge. -Supportive care.  2.   Nausea/emesis -??  Etiology.??  Secondary to UTI -Abdominal films negative for obstruction. -Patient stated to RN was on Reglan prior to admission and as such we will place on IV Reglan for now pending nausea and emesis. -Improving clinically. -Tolerated heart healthy diet.  -Has been assessed by SLP. -Continue IV antiemetics, IV fluids, supportive care.  3.  Dehydration -Saline lock IV fluids.   4.  UTI/candiduria -Urine cultures with multiple species. -Continue IV Rocephin and transition to oral antibiotics tomorrow to complete a 5-day course of treatment. -Change IV Diflucan to oral Diflucan to complete a 7-day course of treatment. -Supportive care.  5.  Sinus tachycardia -Likely secondary to acute infection versus secondary to history of hyperthyroidism. -Sinus tachycardia improved on resumption of home regimen Lopressor. -Continue IV antibiotics for treatment of UTI. -IV fluids, supportive care.  6.  Hyperthyroidism -Continue home regimen Lopressor for rate control. -Continue methimazole. -Outpatient follow-up with PCP/endocrinology.  7.  Hypertensive urgency -Patient required a Cardene drip with improvement with blood pressure.   -Cardene drip has been weaned off.   -Patient noted to have elevated blood pressures yesterday evening.  Continue Lopressor, Norvasc increased to 10 mg daily, patient started on hydralazine 25 twice daily.   8.  Diabetes mellitus type 2 -Hemoglobin A1c 10.5 (10/23/2021) -CBG 122 this morning. -Continue Semglee 30 units daily, SSI, Neurontin. -Per RN patient states on Reglan prior to meals and as such patient has been placed on IV Reglan 5 mg every 6 hours, once nausea and vomiting improves could change IV Reglan to oral Reglan hopefully in the next 24 hours.  9.  Hyperlipidemia -Continue statin, Zetia.  10.  History of CVA with left hemiparesis -Continue risk factor modification. -Continue statin, Zetia, Plavix.   11.  Anxiety -Continue  home regimen Lexapro.  Ativan as needed.   12.  GERD -PPI.    13.  CAD -Continue home regimen Plavix, Lipitor, Zetia, Lopressor.     DVT prophylaxis: Lovenox Code Status: Full Family Communication: Updated patient.  Updated husband on the telephone.  Disposition: TBD  Status is: Inpatient The patient will require care spanning > 2 midnights and should be moved to inpatient because: Severity of illness   Consultants:  None  Procedures:  CT head 11/03/2021 CT chest 11/02/2021 Chest x-ray 11/03/2021   Antimicrobials:  IV Rocephin 11/03/2021 >>>>   Subjective: Laying in bed.  About to eat her breakfast.  Denies any chest pain.  States shortness of breath has improved.  Cough improved.  Abdominal pain improved.  Tolerating current diet.  Noted to have had elevated blood pressures overnight.  Objective: Vitals:   11/05/21 0400 11/05/21 0500 11/05/21 0600 11/05/21 0755  BP: 106/69  111/76   Pulse: 66 74 63   Resp: '14 15 15   '$ Temp: 98 F (36.7 C)   98.4 F (36.9 C)  TempSrc: Oral   Oral  SpO2: 100% 100% 100% 100%  Weight:      Height:        Intake/Output Summary (Last 24 hours) at 11/05/2021 0927 Last data filed at 11/05/2021 0740 Gross per 24 hour  Intake 507.66 ml  Output 750 ml  Net -242.34 ml    Filed Weights   11/02/21 0109 11/02/21 2300  Weight: 81.6 kg 84.1 kg    Examination:  General exam: NAD Respiratory system: Decreasing bibasilar coarse breath sounds.  No wheezing.  Fair air movement.  Speaking in full sentences.   Cardiovascular system: RRR no murmurs rubs or gallops.  No JVD.  No lower extremity edema.  Gastrointestinal system: Abdomen is soft, nondistended, decreased tenderness to palpation suprapubic region, positive bowel sounds.  No rebound.  No guarding.  Central nervous system: Alert and oriented.  Dysarthric speech.  Residual chronic left upper extremity weakness.  Extremities: Left upper extremity weakness.  Skin: No rashes, lesions or  ulcers Psychiatry: Judgement and insight appear normal. Mood & affect appropriate.     Data Reviewed: I have personally reviewed following labs and imaging studies  CBC: Recent Labs  Lab 11/02/21 0146 11/02/21 2329 11/03/21 0451 11/04/21 0317 11/05/21 0259  WBC 9.4 13.2* 17.2* 16.8* 11.7*  NEUTROABS 6.2  --   --  15.0* 7.9*  HGB 12.6 12.9 13.1 11.8* 12.5  HCT 39.2 39.0 40.3 37.9 38.3  MCV 79.4* 78.2* 79.3* 82.0 78.6*  PLT 361 323 304 292 318     Basic Metabolic Panel: Recent Labs  Lab 11/02/21 0146 11/02/21 2329 11/03/21 0451 11/03/21 0811 11/04/21 0317 11/05/21 0259  NA 140 137 138  --  137 138  K 3.6 3.7 4.0  --  3.9 3.3*  CL 106 107 106  --  112* 112*  CO2 25 20* 20*  --  18* 20*  GLUCOSE 210* 213* 232*  --  233* 113*  BUN '9 11 10  '$ --  18 20  CREATININE 0.68 0.68 0.63  --  0.64 0.69  CALCIUM 8.5* 8.7* 8.6*  --  8.0* 8.3*  MG  --  1.7  --  1.7 2.2 2.1     GFR: Estimated Creatinine Clearance: 85.5 mL/min (by C-G formula based on SCr of 0.69 mg/dL).  Liver Function Tests: Recent Labs  Lab 11/02/21 0146 11/02/21 2329 11/03/21 0451  AST 12* 11* 15  ALT '15 13 12  '$ ALKPHOS 77 77 77  BILITOT 0.5 1.0 1.0  PROT 6.9 7.5 7.2  ALBUMIN 3.3* 3.6 3.4*     CBG: Recent Labs  Lab 11/04/21 0737 11/04/21 1112 11/04/21 1553 11/04/21 2223 11/05/21 0821  GLUCAP 212* 189* 178* 178* 122*      Recent Results (from the past 240 hour(s))  Resp Panel by RT-PCR (Flu A&B, Covid) Anterior Nasal Swab     Status: None   Collection Time: 11/02/21  1:46 AM   Specimen: Anterior Nasal Swab  Result Value Ref Range Status   SARS Coronavirus 2 by RT PCR NEGATIVE NEGATIVE Final    Comment: (NOTE) SARS-CoV-2 target nucleic acids are NOT DETECTED.  The SARS-CoV-2 RNA is generally detectable in upper respiratory specimens during the acute phase of infection. The lowest concentration of SARS-CoV-2 viral copies this assay can detect is 138 copies/mL. A negative result does  not preclude SARS-Cov-2 infection and should not be used as the sole basis for treatment or other patient management decisions. A negative result may occur with  improper specimen collection/handling, submission of specimen other than nasopharyngeal swab, presence of viral mutation(s) within the areas targeted by this assay, and inadequate number of viral copies(<138 copies/mL). A negative result must be combined with clinical observations, patient history, and epidemiological information. The expected result is Negative.  Fact Sheet for Patients:  EntrepreneurPulse.com.au  Fact Sheet for Healthcare Providers:  IncredibleEmployment.be  This test is no t yet approved or cleared by the [place] FDA and  has been authorized for detection and/or diagnosis of SARS-CoV-2 by FDA under an Emergency Use Authorization (EUA). This EUA will remain  in effect (meaning this test can be used) for the duration of the COVID-19 declaration under Section 564(b)(1) of the Act, 21 U.S.C.section 360bbb-3(b)(1), unless the authorization is terminated  or revoked sooner.       Influenza A by PCR NEGATIVE NEGATIVE Final   Influenza B by PCR NEGATIVE NEGATIVE Final    Comment: (NOTE) The Xpert Xpress SARS-CoV-2/FLU/RSV plus assay is intended as an aid in the diagnosis of influenza from Nasopharyngeal swab specimens and should not be used as a sole basis for treatment. Nasal washings and aspirates are unacceptable for Xpert Xpress SARS-CoV-2/FLU/RSV testing.  Fact Sheet for Patients: EntrepreneurPulse.com.au  Fact Sheet for Healthcare Providers: IncredibleEmployment.be  This test is not yet approved or cleared by the [place] FDA and has been authorized for detection and/or diagnosis of SARS-CoV-2 by FDA under an Emergency Use Authorization (EUA). This EUA will remain in effect (meaning this test can be used) for the  duration of the COVID-19 declaration under Section 564(b)(1) of the Act, 21 U.S.C. section 360bbb-3(b)(1), unless the authorization is terminated or revoked.  Performed at [facility], [place] [address] [place], [place] [address]   MRSA Next Gen by PCR, Nasal     Status: None   Collection Time: 11/02/21  5:10 AM   Specimen: Nasal Mucosa; Nasal Swab  Result Value Ref Range Status   MRSA by PCR Next Gen NOT DETECTED NOT DETECTED Final    Comment: (NOTE) The GeneXpert MRSA Assay (FDA approved for NASAL specimens only), is one component of a comprehensive MRSA colonization surveillance program. It is not intended to diagnose MRSA infection nor to guide or monitor treatment for MRSA infections. Test performance is not FDA approved in patients  less than 55 years old. Performed at [facility], [place] [address] [place], [place] [address]   Respiratory (~20 pathogens) panel by PCR     Status: None   Collection Time: 11/02/21  6:05 PM   Specimen: Nasopharyngeal Swab; Respiratory  Result Value Ref Range Status   Adenovirus NOT DETECTED NOT DETECTED Final   Coronavirus 229E NOT DETECTED NOT DETECTED Final    Comment: (NOTE) The Coronavirus on the Respiratory Panel, DOES NOT test for the novel  Coronavirus (2019 nCoV)    Coronavirus HKU1 NOT DETECTED NOT DETECTED Final   Coronavirus NL63 NOT DETECTED NOT DETECTED Final   Coronavirus OC43 NOT DETECTED NOT DETECTED Final   Metapneumovirus NOT DETECTED NOT DETECTED Final   Rhinovirus / Enterovirus NOT DETECTED NOT DETECTED Final   Influenza A NOT DETECTED NOT DETECTED Final   Influenza B NOT DETECTED NOT DETECTED Final   Parainfluenza Virus 1 NOT DETECTED NOT DETECTED Final   Parainfluenza Virus 2 NOT DETECTED NOT DETECTED Final   Parainfluenza Virus 3 NOT DETECTED NOT DETECTED Final   Parainfluenza Virus 4 NOT DETECTED NOT DETECTED Final   Respiratory Syncytial Virus NOT DETECTED NOT DETECTED Final    Bordetella pertussis NOT DETECTED NOT DETECTED Final   Bordetella Parapertussis NOT DETECTED NOT DETECTED Final   Chlamydophila pneumoniae NOT DETECTED NOT DETECTED Final   Mycoplasma pneumoniae NOT DETECTED NOT DETECTED Final    Comment: Performed at [facility], [place]. [address] [place], [place] [address]  Culture, blood (Routine X 2) w Reflex to ID Panel     Status: None (Preliminary result)   Collection Time: 11/03/21  8:10 AM   Specimen: BLOOD  Result Value Ref Range Status   Specimen Description   Final    BLOOD BLOOD LEFT HAND Performed at [facility] [address] [place], [place] [address]    Special Requests   Final    BOTTLES DRAWN AEROBIC ONLY Blood Culture results may not be optimal due to an inadequate volume of blood received in culture bottles Performed at [facility] [address], [place], [place] [address]    Culture   Final    NO GROWTH 2 DAYS Performed at [facility] [address] [place], [place] [address]    Report Status PENDING  Incomplete  Culture, blood (Routine X 2) w Reflex to ID Panel     Status: None (Preliminary result)   Collection Time: 11/03/21  8:10 AM   Specimen: BLOOD  Result Value Ref Range Status   Specimen Description   Final    BLOOD BLOOD LEFT HAND Performed at [facility] [address], [place], [place] [address]    Special Requests   Final    BOTTLES DRAWN AEROBIC ONLY Blood Culture results may not be optimal due to an inadequate volume of blood received in culture bottles Performed at [facility] [address] [place], [place] [address]    Culture   Final    NO GROWTH 2 DAYS Performed at [facility] [address], [place], [place] [address]    Report Status PENDING  Incomplete  Urine Culture     Status: Abnormal   Collection Time: 11/03/21  8:38 AM   Specimen: In/Out Cath Urine  Result Value Ref Range Status    Specimen Description   Final    IN/OUT CATH URINE Performed at [facility]  [address] [place], [place] [address]    Special Requests   Final    NONE Performed at [facility], [place] [address] [place], [place] [address]    Culture MULTIPLE SPECIES PRESENT, SUGGEST RECOLLECTION (A)  Final   Report Status 11/04/2021 FINAL  Final         Radiology Studies: No results found.      Scheduled Meds:  amLODipine  10 mg Oral Daily   atorvastatin  80 mg Oral Daily   budesonide (PULMICORT) nebulizer solution  0.5 mg Nebulization BID   Chlorhexidine Gluconate Cloth  6 each Topical Daily   clopidogrel  75 mg Oral Daily   enoxaparin (LOVENOX) injection  40 mg Subcutaneous QHS   escitalopram  20 mg Oral Daily   ezetimibe  10 mg Oral Daily   feeding supplement  237 mL Oral BID BM   gabapentin  100 mg Oral TID   guaiFENesin  1,200 mg Oral BID   hydrALAZINE  10 mg Intravenous Once   hydrALAZINE  25 mg Oral BID   insulin aspart  0-15 Units Subcutaneous TID WC   insulin glargine-yfgn  30 Units Subcutaneous QHS   ipratropium  0.5 mg Nebulization BID   labetalol  20 mg Intravenous Once   levalbuterol  0.63 mg Nebulization BID   melatonin  5 mg Oral QHS   methimazole  5 mg Oral Daily   metoCLOPramide (REGLAN) injection  5 mg Intravenous Q6H   metoprolol tartrate  100 mg Oral BID   pantoprazole  40 mg Oral Daily   predniSONE  40 mg Oral Q breakfast   sodium bicarbonate  650 mg Oral BID   tamsulosin  0.4 mg Oral QPC supper   topiramate  25 mg Oral BID   Continuous Infusions:  cefTRIAXone (ROCEPHIN)  IV 2 g (11/05/21 0926)   fluconazole (DIFLUCAN) IV Stopped (11/05/21 0709)     LOS: 2 days    Time spent: 40 minutes    Irine Seal, MD [facility]   To contact the attending provider between 7A-7P or the covering provider during after hours 7P-7A, please log into the web site [email or web address] and access using universal [facility] password for that web site. If you do not have the password, please call the hospital operator.  11/05/2021, 9:27 AM

## 2021-11-06 DIAGNOSIS — F419 Anxiety disorder, unspecified: Secondary | ICD-10-CM | POA: Diagnosis not present

## 2021-11-06 DIAGNOSIS — I2510 Atherosclerotic heart disease of native coronary artery without angina pectoris: Secondary | ICD-10-CM | POA: Diagnosis not present

## 2021-11-06 DIAGNOSIS — R051 Acute cough: Secondary | ICD-10-CM | POA: Diagnosis not present

## 2021-11-06 DIAGNOSIS — R0600 Dyspnea, unspecified: Secondary | ICD-10-CM | POA: Diagnosis not present

## 2021-11-06 LAB — CBC WITH DIFFERENTIAL/PLATELET
Abs Immature Granulocytes: 0.04 10*3/uL (ref 0.00–0.07)
Basophils Absolute: 0 10*3/uL (ref 0.0–0.1)
Basophils Relative: 0 %
Eosinophils Absolute: 0 10*3/uL (ref 0.0–0.5)
Eosinophils Relative: 1 %
HCT: 39.1 % (ref 36.0–46.0)
Hemoglobin: 13 g/dL (ref 12.0–15.0)
Immature Granulocytes: 1 %
Lymphocytes Relative: 35 %
Lymphs Abs: 3 10*3/uL (ref 0.7–4.0)
MCH: 25.7 pg — ABNORMAL LOW (ref 26.0–34.0)
MCHC: 33.2 g/dL (ref 30.0–36.0)
MCV: 77.4 fL — ABNORMAL LOW (ref 80.0–100.0)
Monocytes Absolute: 0.6 10*3/uL (ref 0.1–1.0)
Monocytes Relative: 7 %
Neutro Abs: 5 10*3/uL (ref 1.7–7.7)
Neutrophils Relative %: 56 %
Platelets: 287 10*3/uL (ref 150–400)
RBC: 5.05 MIL/uL (ref 3.87–5.11)
RDW: 16 % — ABNORMAL HIGH (ref 11.5–15.5)
WBC: 8.6 10*3/uL (ref 4.0–10.5)
nRBC: 0 % (ref 0.0–0.2)

## 2021-11-06 LAB — GLUCOSE, CAPILLARY
Glucose-Capillary: 99 mg/dL (ref 70–99)
Glucose-Capillary: 281 mg/dL — ABNORMAL HIGH (ref 70–99)
Glucose-Capillary: 381 mg/dL — ABNORMAL HIGH (ref 70–99)
Glucose-Capillary: 426 mg/dL — ABNORMAL HIGH (ref 70–99)
Glucose-Capillary: 287 mg/dL — ABNORMAL HIGH (ref 70–99)

## 2021-11-06 LAB — BASIC METABOLIC PANEL
Anion gap: 6 (ref 5–15)
BUN: 19 mg/dL (ref 6–20)
CO2: 21 mmol/L — ABNORMAL LOW (ref 22–32)
Calcium: 8.4 mg/dL — ABNORMAL LOW (ref 8.9–10.3)
Chloride: 112 mmol/L — ABNORMAL HIGH (ref 98–111)
Creatinine, Ser: 0.55 mg/dL (ref 0.44–1.00)
GFR, Estimated: >60 mL/min (ref >60)
Glucose, Bld: 93 mg/dL (ref 70–99)
Potassium: 3.5 mmol/L (ref 3.5–5.1)
Sodium: 139 mmol/L (ref 135–145)

## 2021-11-06 MED ORDER — METOCLOPRAMIDE HCL 10 MG/10ML PO SOLN
5.0000 mg | Freq: Three times a day (TID) | ORAL | Status: DC
  Administered 2021-11-06 – 2021-11-07 (×3): 5 mg via ORAL
  Filled 2021-11-06 (×8): qty 10

## 2021-11-06 MED ORDER — LORAZEPAM 2 MG/ML IJ SOLN
0.5000 mg | Freq: Two times a day (BID) | INTRAMUSCULAR | Status: DC | PRN
  Administered 2021-11-06 – 2021-11-07 (×3): 0.5 mg via INTRAVENOUS
  Filled 2021-11-06 (×3): qty 1

## 2021-11-06 MED ORDER — HYDROXYZINE HCL 25 MG PO TABS
25.0000 mg | ORAL_TABLET | Freq: Three times a day (TID) | ORAL | Status: DC | PRN
  Administered 2021-11-06 – 2021-11-07 (×2): 25 mg via ORAL
  Filled 2021-11-06 (×2): qty 1

## 2021-11-06 MED ORDER — METOCLOPRAMIDE HCL 5 MG/5ML PO SOLN
5.0000 mg | Freq: Three times a day (TID) | ORAL | Status: DC
  Filled 2021-11-06 (×2): qty 5

## 2021-11-06 MED ORDER — POTASSIUM CHLORIDE CRYS ER 20 MEQ PO TBCR
40.0000 meq | EXTENDED_RELEASE_TABLET | Freq: Once | ORAL | Status: AC
  Administered 2021-11-06: 40 meq via ORAL
  Filled 2021-11-06: qty 2

## 2021-11-06 NOTE — Evaluation (Signed)
Speech Language Pathology Evaluation Patient Details Name: Lori [name] MRN: [id number] DOB: 06-14-1970 Today's Date: 11/06/2021 Time: 3295-1884 SLP Time Calculation (min) (ACUTE ONLY): 62 min  Problem List:  Patient Active Problem List   Diagnosis Date Noted   Dyspnea 11/02/2021   Cough 11/02/2021   GERD (gastroesophageal reflux disease) 11/02/2021   Acute bronchitis 08/24/2021   Pseudohyponatremia 07/10/2021   Thyroid disease 07/10/2021   Functional urinary incontinence 05/17/2021   MDD (major depressive disorder), recurrent episode, severe ([place]) 04/09/2021   Diabetic gastroparesis ([place]) 03/16/2021   Migraines 03/16/2021   History of cerebral infarction    Debility 02/26/2021   DKA (diabetic ketoacidosis) ([place]) 02/19/2021   Constipation    Weakness    Type 2 diabetes mellitus with hyperlipidemia ([place])    Aphasia    Spell of abnormal behavior    Stroke ([place]) 01/19/2021   Tachycardia 01/19/2021   CAD (coronary artery disease) 01/19/2021   CVA (cerebral vascular accident) ([place]) 01/19/2021   Diabetes ([place])    Hyperthyroidism    Essential hypertension    Pheochromocytoma    Anxiety and depression    HLD (hyperlipidemia)    Past Medical History:  Past Medical History:  Diagnosis Date   CAD (coronary artery disease)    Depression    Diabetes mellitus without complication ([place])    History of CT scan    History of mammogram    History of MRI    Hypertension    Pheochromocytoma    Pheochromocytoma    Stroke ([place])    Thyroid disease    Past Surgical History:  Past Surgical History:  Procedure Laterality Date   ABDOMINAL HYSTERECTOMY     CESAREAN SECTION     3   CORONARY ARTERY BYPASS GRAFT     Right adrenal gland removal for pheochromocytoma Right    HPI:  Lori [name] is a 51 y.o. female who presented with cough. She reports that she's had cough for the last 2 weeks. She has seen her PCP and had been placed on nebulizers and steroids. However, she  doesn't feel it has been working.  Head CT and CXT 9/30 without acute findings. Pt with medical history significant of DM2, CVA w/ left side hemiparesis, CAD, anxiety, HTN, HLD, GERD.  Pt was seen for SLP swallow evaluation - rec regular/thin diet.  RN informed SLP of concerns for aspiration.   Assessment / Plan / Recommendation Clinical Impression  Patient presents with mild dysarthria - with imprecise articulation and rapid rate.  Mini Mental Status Examination - administered with her scoring 26/30 - - which is not abnormal.  Difficulties noted in higher level problem solving - mental manipulation.  She recalled 1/3 words independently - 2 of 3 words with multiple choice cue.  Strengths included orientation, ability to follow multiple step directions and ability for mental manipulation-working memory with spelling word backward.  Pt reports change only with speech/phonatory strength currently and reports resolution of her aphasia symptoms.   Given she resides with her spouse, has support needed at home but SLP will follow up for dysphagia/pulmonary clearance improvement via RMST.  Pt agreeable to plan.    SLP Assessment  SLP Recommendation/Assessment: Patient does not need any further Speech [facility] Pathology Services SLP Visit Diagnosis: Cognitive communication deficit (R41.841)    Recommendations for follow up therapy are one component of a multi-disciplinary discharge planning process, led by the attending physician.  Recommendations may be updated based on patient status, additional functional criteria and insurance  authorization.    Follow Up Recommendations  Follow physician's recommendations for discharge plan and follow up therapies    Assistance Recommended at Discharge  Frequent or constant Supervision/Assistance  Functional Status Assessment Patient has not had a recent decline in their functional status  Frequency and Duration min 1 x/week         SLP Evaluation Cognition   Overall Cognitive Status: Within Functional Limits for tasks assessed Arousal/Alertness: Awake/alert Orientation Level: Oriented X4 Year: 2023 Month: March Day of Week: Correct Attention: Selective Selective Attention: Impaired Selective Attention Impairment: Verbal complex Memory: Impaired Memory Impairment: Storage deficit;Retrieval deficit (1/3) Awareness: Impaired Awareness Impairment: Intellectual impairment (impaired awareness to dysphagia  possibly due to chronicity of deficits) Problem Solving: Impaired Problem Solving Impairment: Verbal complex Safety/Judgment: Impaired       Comprehension  Auditory Comprehension Overall Auditory Comprehension: Appears within functional limits for tasks assessed Yes/No Questions: Not tested Commands: Within Functional Limits Conversation: Complex Visual Recognition/Discrimination Discrimination: Not tested Reading Comprehension Reading Status: Within funtional limits    Expression Expression Primary Mode of Expression: Verbal Verbal Expression Overall Verbal Expression: Appears within functional limits for tasks assessed Initiation: No impairment Level of Generative/Spontaneous Verbalization: Conversation Repetition: No impairment Level of Impairment: Phrase level Naming: No impairment Pragmatics: No impairment Interfering Components: Attention Non-Verbal Means of Communication: Not applicable Written Expression Dominant Hand: Right Written Expression: Not tested   Oral / Motor  Oral Motor/Sensory Function Overall Oral Motor/Sensory Function: Moderate impairment Facial ROM: Reduced left Facial Symmetry: Abnormal symmetry left Facial Strength: Within Functional Limits Facial Sensation: Within Functional Limits Lingual ROM: Reduced right;Reduced left Lingual Symmetry: Within Functional Limits Lingual Strength: Reduced;Suspected CN XII (hypoglossal) dysfunction Lingual Sensation: Within Functional Limits Velum: Within  Functional Limits Mandible: Within Functional Limits Motor Speech Overall Motor Speech: Appears within functional limits for tasks assessed Respiration: Impaired Level of Impairment: Sentence Phonation: Hoarse;Low vocal intensity Resonance: Within functional limits Articulation: Within functional limitis Intelligibility: Intelligible Motor Planning: Not tested Motor Speech Errors: Not applicable Interfering Components: Premorbid status Effective Techniques: Increased vocal intensity            Macario Golds 11/06/2021, 7:12 PM Kathleen Lime, MS [facility] SLP Acute Rehab Services Office [phone] Pager [phone]

## 2021-11-06 NOTE — Progress Notes (Signed)
Per speech therapist this afternoon, recommended patient be placed on Dysphagia 3 diet as patient continues to cough each time she drinks thin liquids. Patient refused this change to her diet. This nurse educated the patient on the importance of sitting at a 90 degree angle when drinking liquids. Patient continues to lean back and cough and continues to cough at times even at a 90 degree angle. Patient gave her verbal understanding of the risks of aspiration at this time and continues to refuse an altered diet/sitting upright.

## 2021-11-06 NOTE — Progress Notes (Signed)
Speech Language Pathology Treatment:    Patient Details Name: Lori [name] MRN: [id number] DOB: [birth year] Today's Date: 11/06/2021 Time: 3291-9166 SLP Time Calculation (min) (ACUTE ONLY): 18 min  Assessment / Plan / Recommendation Clinical Impression  Pt seen to assess po tolerance - lunch meal in front of her- sitting fully upright thankfully.  Observed with thin liquids and with solids - pineapple.  Pt with entire piece of pineapple in her posterior oral cavity without any mastication- Cues to expectorate effective.  SLP cut up food - which allowed pt to clear orally.  She has not lower teeth and is dysarthric indicative of oral dysphagia.   No s/s of aspiration with thin via straw.  Pt admits that voice is not as strong and speech is not as clear as baseline.  Recommend dys3/thin.    CT 06/2021 Prior brain imaging showed left thalamus and right basal ganglia, and pons, compatible with chronic microhemorrhages .  08/29/2021 MRI showed Small remote infarcts with associated susceptibility artifact are seen on the right side of the pons, right middle cerebral peduncle, thalami, basal ganglia, corpus callosum, bilateral the corona radiata and left frontal lobe.    HPI HPI: Lori [name] is a 51 y.o. female who presented with cough. She reports that she's had cough for the last 2 weeks. She has seen her PCP and had been placed on nebulizers and steroids. However, she doesn't feel it has been working.  Head CT and CXT 9/30 without acute findings. Pt with medical history significant of DM2, CVA w/ left side hemiparesis, CAD, anxiety, HTN, HLD, GERD.  Pt was seen for SLP swallow evaluation - rec regular/thin diet.  RN informed SLP of concerns for aspiration.      SLP Plan  Continue with current plan of care      Recommendations for follow up therapy are one component of a multi-disciplinary discharge planning process, led by the attending physician.  Recommendations may be updated based on  patient status, additional functional criteria and insurance authorization.    Recommendations  Diet recommendations: Dysphagia 3 (mechanical soft);Thin liquid Liquids provided via: Cup;Straw Medication Administration: Whole meds with puree (pt prefers yogurt) Supervision: Patient able to self feed Compensations: Slow rate;Small sips/bites Postural Changes and/or Swallow Maneuvers: Seated upright 90 degrees;Upright 30-60 min after meal                Oral Care Recommendations: Oral care BID Follow Up Recommendations: Follow physician's recommendations for discharge plan and follow up therapies Assistance recommended at discharge: None SLP Visit Diagnosis: Dysphagia, oral phase (R13.11) Plan: Continue with current plan of care          Kathleen Lime, MS [place] [phone] Pager [phone]  Macario Golds  11/06/2021, 4:42 PM

## 2021-11-06 NOTE — Progress Notes (Signed)
PROGRESS NOTE    Lori [name]  TDH:[id number] DOB: 1970-10-26 DOA: 11/02/2021 PCP: [name], NP    Chief Complaint  Patient presents with   Respiratory Distress    Brief Narrative:  Patient is an unfortunate 51 year old female history of type 2 diabetes, CVA with residual left-sided hemiparesis, CAD, anxiety, hypertension, hyperlipidemia, GERD presented to the ED with cough x2 weeks, shortness of breath with no improvement despite nebs and steroids in the outpatient setting.  Patient had a referral to see pulmonology but symptoms worsened and presented to the ED.  Patient admitted with dyspnea and cough, placed on nebs and steroids.  CT chest done with bibasilar atelectasis versus pneumonia.  Procalcitonin was negative.  Urinalysis also concerning for UTI.  On hospital day 1 while in the stepdown unit due to concerns for neurological change and worsening aphasia head CT was obtained which was negative for any acute abnormalities.  Patient noted to have nausea and emesis on initial presentation.   Assessment & Plan:   Principal Problem:   Dyspnea Active Problems:   Essential hypertension   Diabetes (HCC)   Hyperthyroidism   CAD (coronary artery disease)   Anxiety and depression   HLD (hyperlipidemia)   CVA (cerebral vascular accident) ([place])   Cough   GERD (gastroesophageal reflux disease)  #1 dyspnea/cough -??  Questionable etiology. -CT chest with bibasilar atelectasis versus pneumonia. -Procalcitonin noted to be negative. -Patient with sats of 99 to 100% on room air. -Patient noted to have nausea and emesis the evening of 11/02/2021 with repeat chest x-ray 11/03/2021 unremarkable.  -Patient clinically improving.  -Continue home regimen Pulmicort. -Continue Atrovent and Xopenex nebs, steroid taper. -Continue Mucinex.   -Continue to hold ACE inhibitor as patient presented with cough and will not resume ACE inhibitor on discharge. -Supportive care.  2.   Nausea/emesis -??  Etiology.??  Secondary to UTI -Abdominal films negative for obstruction. -Patient stated to RN was on Reglan prior to admission and as such placed on IV Reglan for now pending nausea and emesis. -Improving clinically. -We will change IV Reglan to oral Reglan. -Tolerated heart healthy diet.  -Has been assessed by SLP. -Continue IV antiemetics, IV fluids, supportive care.  3.  Dehydration -Saline lock IV fluids.   4.  UTI/candiduria -Urine cultures with multiple species. -Continue IV Rocephin to complete a 5-day course of treatment and discontinue after tomorrow's dose.   -IV Diflucan has been changed to oral Diflucan to complete a 7-day course of treatment.   -Supportive care.   5.  Sinus tachycardia -Likely secondary to acute infection versus secondary to history of hyperthyroidism. -Sinus tachycardia improved on resumption of home regimen Lopressor. -Continue IV antibiotics for treatment of UTI. -Supportive care.  6.  Hyperthyroidism -Continue home regimen Lopressor for rate control.   -Continue methimazole.  -Outpatient follow-up with PCP/endocrinology.  7.  Hypertensive urgency -Patient required a Cardene drip with improvement with blood pressure.   -Cardene drip has been weaned off.   -Patient noted to have some labile blood pressures yesterday. -Blood pressure seems to be stable this morning continue Lopressor, Norvasc. -We will started on oral hydralazine however due to low blood pressure that was discontinued on 11/05/2021. -Supportive care.  8.  Diabetes mellitus type 2 -Hemoglobin A1c 10.5 (10/23/2021) -CBG 99 this morning. -Continue Semglee 30 units daily, SSI, Neurontin. -Per RN patient states on Reglan prior to meals and as such patient has been placed on IV Reglan 5 mg every 6 hours. -Nausea and vomiting  have improved we will change IV Reglan to oral Reglan before meals and at bedtime.  9.  Hyperlipidemia -Continue Zetia, statin.   10.   History of CVA with left hemiparesis -Continue risk factor modification. -Continue statin, Zetia, Plavix.  -PT/OT.  11.  Anxiety -Continue home regimen Lexapro.  Ativan as needed.   12.  GERD -PPI.    13.  CAD -Continue home regimen Lipitor, Plavix, Zetia, Lopressor.     DVT prophylaxis: Lovenox Code Status: Full Family Communication: Updated patient.  Updated mother who was on speaker phone in room.  Disposition: TBD  Status is: Inpatient The patient will require care spanning > 2 midnights and should be moved to inpatient because: Severity of illness   Consultants:  None  Procedures:  CT head 11/03/2021 CT chest 11/02/2021 Chest x-ray 11/03/2021   Antimicrobials:  IV Rocephin 11/03/2021 >>>> 11/06/2021 IV Levaquin on presentation in the ED x 1 dose.   Subjective: Sitting up in bed.  On the telephone with her mother.  Overall feeling better.  Denies any chest pain.  Feels shortness of breath has improved.  Feels cough is improved.  Denies any abdominal pain.  Tolerating current diet.  Objective: Vitals:   11/06/21 0500 11/06/21 0700 11/06/21 0756 11/06/21 0806  BP: 116/68 133/86    Pulse: 61 66 71   Resp: '15 13 14   '$ Temp:      TempSrc:      SpO2: 96% 95% 95% 95%  Weight:      Height:        Intake/Output Summary (Last 24 hours) at 11/06/2021 0936 Last data filed at 11/06/2021 0600 Gross per 24 hour  Intake 100 ml  Output 1450 ml  Net -1350 ml    Filed Weights   11/02/21 0109 11/02/21 2300  Weight: 81.6 kg 84.1 kg    Examination:  General exam: NAD Respiratory system: CTA B.  Decreased bibasilar breath sounds.  No wheezing.  Fair air movement.  Speaking in full sentences.    Cardiovascular system: Regular rate rhythm no murmurs rubs or gallops.  No JVD.  No lower extremity edema.  Gastrointestinal system: Abdomen is soft, nontender, nondistended, positive bowel sounds.  No rebound.  No guarding.  Central nervous system: Alert and oriented.  Dysarthric  speech.  Residual chronic left upper extremity weakness.  Extremities: Residual chronic left upper extremity weakness.  Skin: No rashes, lesions or ulcers Psychiatry: Judgement and insight appear normal. Mood & affect appropriate.     Data Reviewed: I have personally reviewed following labs and imaging studies  CBC: Recent Labs  Lab 11/02/21 0146 11/02/21 2329 11/03/21 0451 11/04/21 0317 11/05/21 0259 11/06/21 0307  WBC 9.4 13.2* 17.2* 16.8* 11.7* 8.6  NEUTROABS 6.2  --   --  15.0* 7.9* 5.0  HGB 12.6 12.9 13.1 11.8* 12.5 13.0  HCT 39.2 39.0 40.3 37.9 38.3 39.1  MCV 79.4* 78.2* 79.3* 82.0 78.6* 77.4*  PLT 361 323 304 292 318 287     Basic Metabolic Panel: Recent Labs  Lab 11/02/21 2329 11/03/21 0451 11/03/21 0811 11/04/21 0317 11/05/21 0259 11/06/21 0307  NA 137 138  --  137 138 139  K 3.7 4.0  --  3.9 3.3* 3.5  CL 107 106  --  112* 112* 112*  CO2 20* 20*  --  18* 20* 21*  GLUCOSE 213* 232*  --  233* 113* 93  BUN 11 10  --  '18 20 19  '$ CREATININE 0.68 0.63  --  0.64 0.69 0.55  CALCIUM 8.7* 8.6*  --  8.0* 8.3* 8.4*  MG 1.7  --  1.7 2.2 2.1  --      GFR: Estimated Creatinine Clearance: 85.5 mL/min (by C-G formula based on SCr of 0.55 mg/dL).  Liver Function Tests: Recent Labs  Lab 11/02/21 0146 11/02/21 2329 11/03/21 0451  AST 12* 11* 15  ALT '15 13 12  '$ ALKPHOS 77 77 77  BILITOT 0.5 1.0 1.0  PROT 6.9 7.5 7.2  ALBUMIN 3.3* 3.6 3.4*     CBG: Recent Labs  Lab 11/05/21 0821 11/05/21 1141 11/05/21 1700 11/05/21 2140 11/06/21 0753  GLUCAP 122* 205* 136* 118* 99      Recent Results (from the past 240 hour(s))  Resp Panel by RT-PCR (Flu A&B, Covid) Anterior Nasal Swab     Status: None   Collection Time: 11/02/21  1:46 AM   Specimen: Anterior Nasal Swab  Result Value Ref Range Status   SARS Coronavirus 2 by RT PCR NEGATIVE NEGATIVE Final    Comment: (NOTE) SARS-CoV-2 target nucleic acids are NOT DETECTED.  The SARS-CoV-2 RNA is generally  detectable in upper respiratory specimens during the acute phase of infection. The lowest concentration of SARS-CoV-2 viral copies this assay can detect is 138 copies/mL. A negative result does not preclude SARS-Cov-2 infection and should not be used as the sole basis for treatment or other patient management decisions. A negative result may occur with  improper specimen collection/handling, submission of specimen other than nasopharyngeal swab, presence of viral mutation(s) within the areas targeted by this assay, and inadequate number of viral copies(<138 copies/mL). A negative result must be combined with clinical observations, patient history, and epidemiological information. The expected result is Negative.  Fact Sheet for Patients:  EntrepreneurPulse.com.au  Fact Sheet for Healthcare Providers:  IncredibleEmployment.be  This test is no t yet approved or cleared by the [place] FDA and  has been authorized for detection and/or diagnosis of SARS-CoV-2 by FDA under an Emergency Use Authorization (EUA). This EUA will remain  in effect (meaning this test can be used) for the duration of the COVID-19 declaration under Section 564(b)(1) of the Act, 21 U.S.C.section 360bbb-3(b)(1), unless the authorization is terminated  or revoked sooner.       Influenza A by PCR NEGATIVE NEGATIVE Final   Influenza B by PCR NEGATIVE NEGATIVE Final    Comment: (NOTE) The Xpert Xpress SARS-CoV-2/FLU/RSV plus assay is intended as an aid in the diagnosis of influenza from Nasopharyngeal swab specimens and should not be used as a sole basis for treatment. Nasal washings and aspirates are unacceptable for Xpert Xpress SARS-CoV-2/FLU/RSV testing.  Fact Sheet for Patients: EntrepreneurPulse.com.au  Fact Sheet for Healthcare Providers: IncredibleEmployment.be  This test is not yet approved or cleared by the [place] FDA  and has been authorized for detection and/or diagnosis of SARS-CoV-2 by FDA under an Emergency Use Authorization (EUA). This EUA will remain in effect (meaning this test can be used) for the duration of the COVID-19 declaration under Section 564(b)(1) of the Act, 21 U.S.C. section 360bbb-3(b)(1), unless the authorization is terminated or revoked.  Performed at [facility], [place] [address], [place], [place] [address]   MRSA Next Gen by PCR, Nasal     Status: None   Collection Time: 11/02/21  5:10 AM   Specimen: Nasal Mucosa; Nasal Swab  Result Value Ref Range Status   MRSA by PCR Next Gen NOT DETECTED NOT DETECTED Final    Comment: (NOTE)  The GeneXpert MRSA Assay (FDA approved for NASAL specimens only), is one component of a comprehensive MRSA colonization surveillance program. It is not intended to diagnose MRSA infection nor to guide or monitor treatment for MRSA infections. Test performance is not FDA approved in patients less than 49 years old. Performed at [facility], [place] [address] [place], [place] [address]   Respiratory (~20 pathogens) panel by PCR     Status: None   Collection Time: 11/02/21  6:05 PM   Specimen: Nasopharyngeal Swab; Respiratory  Result Value Ref Range Status   Adenovirus NOT DETECTED NOT DETECTED Final   Coronavirus 229E NOT DETECTED NOT DETECTED Final    Comment: (NOTE) The Coronavirus on the Respiratory Panel, DOES NOT test for the novel  Coronavirus (2019 nCoV)    Coronavirus HKU1 NOT DETECTED NOT DETECTED Final   Coronavirus NL63 NOT DETECTED NOT DETECTED Final   Coronavirus OC43 NOT DETECTED NOT DETECTED Final   Metapneumovirus NOT DETECTED NOT DETECTED Final   Rhinovirus / Enterovirus NOT DETECTED NOT DETECTED Final   Influenza A NOT DETECTED NOT DETECTED Final   Influenza B NOT DETECTED NOT DETECTED Final   Parainfluenza Virus 1 NOT DETECTED NOT DETECTED Final   Parainfluenza Virus 2 NOT DETECTED NOT  DETECTED Final   Parainfluenza Virus 3 NOT DETECTED NOT DETECTED Final   Parainfluenza Virus 4 NOT DETECTED NOT DETECTED Final   Respiratory Syncytial Virus NOT DETECTED NOT DETECTED Final   Bordetella pertussis NOT DETECTED NOT DETECTED Final   Bordetella Parapertussis NOT DETECTED NOT DETECTED Final   Chlamydophila pneumoniae NOT DETECTED NOT DETECTED Final   Mycoplasma pneumoniae NOT DETECTED NOT DETECTED Final    Comment: Performed at [facility], [place]. [address], [place], [place] [address]  Culture, blood (Routine X 2) w Reflex to ID Panel     Status: None (Preliminary result)   Collection Time: 11/03/21  8:10 AM   Specimen: BLOOD  Result Value Ref Range Status   Specimen Description   Final    BLOOD BLOOD LEFT HAND Performed at [place] [address], [place], [place] [address]    Special Requests   Final    BOTTLES DRAWN AEROBIC ONLY Blood Culture results may not be optimal due to an inadequate volume of blood received in culture bottles Performed at [place] [address], [place], [place] [address]    Culture   Final    NO GROWTH 3 DAYS Performed at [facility], [place] [address] [place], [place] [address]    Report Status PENDING  Incomplete  Culture, blood (Routine X 2) w Reflex to ID Panel     Status: None (Preliminary result)   Collection Time: 11/03/21  8:10 AM   Specimen: BLOOD  Result Value Ref Range Status   Specimen Description   Final    BLOOD BLOOD LEFT HAND Performed at [place] [address] [place], [place] [address]    Special Requests   Final    BOTTLES DRAWN AEROBIC ONLY Blood Culture results may not be optimal due to an inadequate volume of blood received in culture bottles Performed at [place] [address] [place], [place] [address]    Culture   Final    NO GROWTH 3 DAYS Performed at [facility], [place] [address] [place],  [place] [address]    Report Status PENDING  Incomplete  Urine Culture  Status: Abnormal   Collection Time: 11/03/21  8:38 AM   Specimen: In/Out Cath Urine  Result Value Ref Range Status   Specimen Description   Final    IN/OUT CATH URINE Performed at [facility], [place] [address] [place], [place] [address]    Special Requests   Final    NONE Performed at [facility], [place] [address] [place], [place] [address]    Culture MULTIPLE SPECIES PRESENT, SUGGEST RECOLLECTION (A)  Final   Report Status 11/04/2021 FINAL  Final         Radiology Studies: No results found.      Scheduled Meds:  amLODipine  10 mg Oral Daily   atorvastatin  80 mg Oral Daily   budesonide (PULMICORT) nebulizer solution  0.5 mg Nebulization BID   Chlorhexidine Gluconate Cloth  6 each Topical Daily   clopidogrel  75 mg Oral Daily   enoxaparin (LOVENOX) injection  40 mg Subcutaneous QHS   escitalopram  20 mg Oral Daily   ezetimibe  10 mg Oral Daily   feeding supplement  237 mL Oral BID BM   fluconazole  200 mg Oral Daily   gabapentin  100 mg Oral TID   guaiFENesin  1,200 mg Oral BID   hydrALAZINE  10 mg Intravenous Once   insulin aspart  0-15 Units Subcutaneous TID WC   insulin glargine-yfgn  30 Units Subcutaneous QHS   ipratropium  0.5 mg Nebulization BID   labetalol  20 mg Intravenous Once   levalbuterol  0.63 mg Nebulization BID   melatonin  5 mg Oral QHS   methimazole  5 mg Oral Daily   metoCLOPramide (REGLAN) injection  5 mg Intravenous Q6H   metoprolol tartrate  100 mg Oral BID   pantoprazole  40 mg Oral Daily   tamsulosin  0.4 mg Oral QPC supper   topiramate  25 mg Oral BID   Continuous Infusions:  cefTRIAXone (ROCEPHIN)  IV 2 g (11/06/21 0816)   sodium chloride Stopped (11/05/21 1429)     LOS: 3 days    Time spent: 40 minutes    Irine Seal, MD [facility]   To contact the attending provider between 7A-7P or the covering provider  during after hours 7P-7A, please log into the web site [email or web address] and access using universal [place] password for that web site. If you do not have the password, please call the hospital operator.  11/06/2021, 9:36 AM

## 2021-11-06 NOTE — Progress Notes (Signed)
Patient is complaining of anxiety and requested her Ativan so she could go to sleep. This nurse informed the patient that is still very early in the afternoon and it has been noted that the ativan makes her very lethargic. The patient stated she likes the ativan medication for sleep. This nurse reached out to the provider to see if there is an alternative medication PRN for anxiety that will not make the patient so sleepy.   See new order for PRN atarax.

## 2021-11-07 DIAGNOSIS — I639 Cerebral infarction, unspecified: Secondary | ICD-10-CM | POA: Diagnosis not present

## 2021-11-07 DIAGNOSIS — F32A Depression, unspecified: Secondary | ICD-10-CM | POA: Diagnosis not present

## 2021-11-07 DIAGNOSIS — F419 Anxiety disorder, unspecified: Secondary | ICD-10-CM | POA: Diagnosis not present

## 2021-11-07 LAB — GLUCOSE, CAPILLARY
Glucose-Capillary: 180 mg/dL — ABNORMAL HIGH (ref 70–99)
Glucose-Capillary: 208 mg/dL — ABNORMAL HIGH (ref 70–99)
Glucose-Capillary: 111 mg/dL — ABNORMAL HIGH (ref 70–99)
Glucose-Capillary: 149 mg/dL — ABNORMAL HIGH (ref 70–99)

## 2021-11-07 MED ORDER — CLOPIDOGREL BISULFATE 75 MG PO TABS: 75.0000 mg | ORAL_TABLET | Freq: Every day | ORAL | 0 refills | Status: AC

## 2021-11-07 MED ORDER — METOCLOPRAMIDE HCL 10 MG/10ML PO SOLN: 5.0000 mg | Freq: Three times a day (TID) | ORAL | 0 refills | Status: DC

## 2021-11-07 MED ORDER — AMLODIPINE BESYLATE 10 MG PO TABS: 10.0000 mg | ORAL_TABLET | Freq: Every day | ORAL | 0 refills | Status: DC

## 2021-11-07 MED ORDER — FLUCONAZOLE 200 MG PO TABS: 200.0000 mg | ORAL_TABLET | Freq: Every day | ORAL | 0 refills | Status: AC

## 2021-11-07 NOTE — TOC Transition Note (Signed)
Transition of Care [facility]) - CM/SW Discharge Note   Patient Details  Name: Lori [name] MRN: [id number] Date of Birth: 1970-03-16  [facility] of [facility]) CM/SW Contact:  Leeroy Cha, RN Phone Number: 11/07/2021, 1:04 PM   Clinical Narrative:    Discharged to home with hhc      Barriers to Discharge: Barriers Resolved   Patient Goals and CMS Choice Patient states their goals for this hospitalization and ongoing recovery are:: to go home CMS Medicare.gov Compare Post Acute Care list provided to:: Patient Choice offered to / list presented to : Patient  Discharge Placement                       Discharge Plan and Services   Discharge Planning Services: CM Consult Post Acute Care Choice: Home Health                    HH Arranged: PT, OT, Nurse's Aide, Speech Therapy HH Agency: [place] ([facility]) Date [facility] Agency Contacted: 11/07/21 Time [place]: 1115 Representative spoke with at [place]: [place] ([place]) Interventions     Readmission Risk Interventions   Row Labels 08/29/2021    2:32 PM 07/18/2021   12:43 PM 07/11/2021    1:22 PM  Readmission Risk Prevention Plan   Section Header. No data exists in this row.     Transportation Screening   Complete Complete Complete  Medication Review [employer])   Complete Complete Complete  PCP or Specialist appointment within 3-5 days of discharge   Complete Complete   HRI or Home Care Consult   Complete Complete Complete  SW Recovery Care/Counseling Consult   Complete Complete Complete  Palliative Care Screening   Not Applicable Not Applicable Not Applicable  Skilled Nursing Facility   Complete Complete Complete

## 2021-11-07 NOTE — TOC Transition Note (Signed)
Transition of Care [facility]) - CM/SW Discharge Note   Patient Details  Name: Lori [name] MRN: [id number] Date of Birth: [birth year]  Transition of Care [facility]) CM/SW Contact:  Leeroy Cha, RN Phone Number: 11/07/2021, 3:01 PM   Clinical Narrative:     Corey Harold CALLED AT 150`1 DO for transport to home.    Barriers to Discharge: Barriers Resolved   Patient Goals and CMS Choice Patient states their goals for this hospitalization and ongoing recovery are:: to go home CMS Medicare.gov Compare Post Acute Care list provided to:: Patient Choice offered to / list presented to : Patient  Discharge Placement                       Discharge Plan and Services   Discharge Planning Services: CM Consult Post Acute Care Choice: Home Health                    HH Arranged: PT, OT, Nurse's Aide, Speech Therapy HH Agency: [place] ([facility]) Date [facility] Agency Contacted: 11/07/21 Time [place]: 1115 Representative spoke with at [place]: [place] ([place]) Interventions     Readmission Risk Interventions   Row Labels 08/29/2021    2:32 PM 07/18/2021   12:43 PM 07/11/2021    1:22 PM  Readmission Risk Prevention Plan   Section Header. No data exists in this row.     Transportation Screening   Complete Complete Complete  Medication Review [employer])   Complete Complete Complete  PCP or Specialist appointment within 3-5 days of discharge   Complete Complete   HRI or Home Care Consult   Complete Complete Complete  SW Recovery Care/Counseling Consult   Complete Complete Complete  Palliative Care Screening   Not Applicable Not Applicable Not Applicable  Skilled Nursing Facility   Complete Complete Complete

## 2021-11-07 NOTE — Discharge Summary (Signed)
Physician Discharge Summary   Patient: Lori [name] MRN: [id number] DOB: 22-Aug-1970  Admit date:     11/02/2021  Discharge date: 11/07/21  Discharge Physician: Marylu Lund   PCP: [name], NP   Recommendations at discharge:    Follow up with PCP In 1-2 weeks  Discharge Diagnoses: Principal Problem:   Dyspnea Active Problems:   Essential hypertension   Diabetes ([place])   Hyperthyroidism   CAD (coronary artery disease)   Anxiety and depression   HLD (hyperlipidemia)   CVA (cerebral vascular accident) ([place])   Cough   GERD (gastroesophageal reflux disease)  Resolved Problems:   * No resolved hospital problems. *  Hospital Course: 51 year old female history of type 2 diabetes, CVA with residual left-sided hemiparesis, CAD, anxiety, hypertension, hyperlipidemia, GERD presented to the ED with cough x2 weeks, shortness of breath with no improvement despite nebs and steroids in the outpatient setting.  Patient had a referral to see pulmonology but symptoms worsened and presented to the ED.  Patient admitted with dyspnea and cough, placed on nebs and steroids.  CT chest done with bibasilar atelectasis versus pneumonia.  Procalcitonin was negative.  Urinalysis also concerning for UTI.  On hospital day 1 while in the stepdown unit due to concerns for neurological change and worsening aphasia head CT was obtained which was negative for any acute abnormalities.  Patient noted to have nausea and emesis on initial presentation.  Assessment and Plan:  #1 dyspnea/cough -Unclear etiology. -CT chest with bibasilar atelectasis versus pneumonia. -Procalcitonin noted to be negative. -Patient with sats of 99 to 100% on room air. -Patient noted to have nausea and emesis the evening of 11/02/2021 with repeat chest x-ray 11/03/2021 unremarkable.  -Patient clinically improved -Continue home regimen Pulmicort. -Continue Atrovent and Xopenex nebs, steroid taper. -Continue Mucinex.   -Continue  to hold ACE inhibitor as patient presented with cough and will not resume ACE inhibitor on discharge.   2.  Nausea/emesis -Suspect likely secondary to UTI -Abdominal films negative for obstruction. -Patient stated to RN was on Reglan prior to admission and as such placed on IV Reglan for now pending nausea and emesis. -Improving clinically. -changed IV Reglan to oral Reglan. -Tolerated heart healthy diet.  -Has been assessed by SLP. -Continue IV antiemetics, IV fluids, supportive care.   3.  Dehydration -Saline lock IV fluids.    4.  UTI/candiduria -Urine cultures with multiple species. -Completed course of rocephin   -IV Diflucan has been changed to oral Diflucan to complete a 7-day course of treatment.     5.  Sinus tachycardia -Likely secondary to acute infection versus secondary to history of hyperthyroidism. -Sinus tachycardia improved on resumption of home regimen Lopressor. -Completed course of abx per above   6.  Hyperthyroidism -Continued home regimen Lopressor for rate control.   -Continued methimazole.  -Outpatient follow-up with PCP/endocrinology.   7.  Hypertensive urgency -Patient required a Cardene drip with improvement with blood pressure.   -Cardene drip has been weaned off.   -Patient noted to have some labile blood pressures yesterday. -Blood pressure seems to be stable this morning continue Lopressor, Norvasc. -We will started on oral hydralazine however due to low blood pressure that was discontinued on 11/05/2021. -Supportive care.   8.  Diabetes mellitus type 2 -Hemoglobin A1c 10.5 (10/23/2021) -CBG 99 this morning. -Continue Semglee 30 units daily, SSI, Neurontin. -Per RN patient states on Reglan prior to meals and as such patient has been placed on IV Reglan 5 mg every  6 hours. -Nausea and vomiting have improved   9.  Hyperlipidemia -Continue Zetia, statin.    10.  History of CVA with left hemiparesis -Continue risk factor  modification. -Continue statin, Zetia, Plavix.  -PT/OT.   11.  Anxiety -Continue home regimen Lexapro.  Ativan as needed.    12.  GERD -PPI.     13.  CAD -Continue home regimen Lipitor, Plavix, Zetia, Lopressor.         Consultants:  Procedures performed:   Disposition: Home health Diet recommendation:  Dysphagia type 3 thin Liquid DISCHARGE MEDICATION: Allergies as of 11/07/2021       Reactions   Contrast Media [iodinated Contrast Media] Anaphylaxis, Swelling   Sumatriptan Anaphylaxis   Morphine Swelling   Penicillins Swelling   Mouth swells up and eyes swollen shut        Medication List     STOP taking these medications    cephALEXin 500 MG capsule Commonly known as: KEFLEX   empagliflozin 10 MG Tabs tablet Commonly known as: Jardiance   lisinopril 20 MG tablet Commonly known as: ZESTRIL       TAKE these medications    acetaminophen 325 MG tablet Commonly known as: TYLENOL Take 2 tablets (650 mg total) by mouth every 6 (six) hours as needed for mild pain or headache.   albuterol (2.5 MG/3ML) 0.083% nebulizer solution Commonly known as: PROVENTIL Take 3 mLs (2.5 mg total) by nebulization every 6 (six) hours as needed for wheezing or shortness of breath.   amLODipine 10 MG tablet Commonly known as: NORVASC Take 1 tablet (10 mg total) by mouth daily. Start taking on: November 08, 2021   atorvastatin 80 MG tablet Commonly known as: LIPITOR Take 80 mg by mouth daily.   BD Pen Needle Nano U/F 32G X 4 MM Misc Generic drug: Insulin Pen Needle Use to inject insulin 4 times daily as directed.   blood glucose meter kit and supplies Kit Dispense based on patient and insurance preference. Use up to four times daily as directed.   budesonide 0.25 MG/2ML nebulizer solution Commonly known as: PULMICORT Take 2 mLs (0.25 mg total) by nebulization 2 (two) times daily.   clopidogrel 75 MG tablet Commonly known as: PLAVIX Take 1 tablet (75 mg total) by  mouth daily.   Dexcom G5 Receiver Kit Devi 1 Device by Does not apply route 4 (four) times daily.   diclofenac Sodium 1 % Gel Commonly known as: VOLTAREN Apply 4 g topically 4 (four) times daily. What changed:  how much to take when to take this reasons to take this additional instructions   diphenhydrAMINE 25 MG tablet Commonly known as: BENADRYL Take 25 mg by mouth at bedtime.   escitalopram 20 MG tablet Commonly known as: LEXAPRO TAKE 1 TABLET BY MOUTH EVERY MORNING What changed: when to take this   ezetimibe 10 MG tablet Commonly known as: Zetia Take 1 tablet (10 mg total) by mouth daily.   fluconazole 200 MG tablet Commonly known as: DIFLUCAN Take 1 tablet (200 mg total) by mouth daily for 3 days. Start taking on: November 08, 2021   gabapentin 100 MG capsule Commonly known as: NEURONTIN TAKE 1 CAPSULE BY MOUTH THREE TIMES DAILY (BREAKFAST, LUNCH, BEDTIME) What changed: See the new instructions.   hydrOXYzine 25 MG tablet Commonly known as: ATARAX Take 1 tablet (25 mg total) by mouth at bedtime as needed for anxiety. What changed: when to take this   Insulin Aspart FlexPen 100 UNIT/ML Commonly known  as: NOVOLOG ADMINISTER 4 UNITS UNDER THE SKIN THREE TIMES DAILY WITH MEALS What changed: See the new instructions.   ipratropium-albuterol 0.5-2.5 (3) MG/3ML Soln Commonly known as: DUONEB Take 3 mLs by nebulization 2 (two) times daily. What changed:  when to take this reasons to take this   Lantus SoloStar 100 UNIT/ML Solostar Pen Generic drug: insulin glargine Inject 35 Units into the skin at bedtime. What changed: how much to take   LORazepam 0.5 MG tablet Commonly known as: ATIVAN TAKE 1 TABLET BY MOUTH AT BEDTIME   melatonin 3 MG Tabs tablet Take 1 tablet (3 mg total) by mouth at bedtime. What changed: how much to take   methimazole 5 MG tablet Commonly known as: TAPAZOLE TAKE 1 TABLET BY MOUTH EVERY MORNING What changed: when to take this    metoCLOPramide 10 MG/10ML Soln Commonly known as: REGLAN Take 5 mLs (5 mg total) by mouth 4 (four) times daily -  before meals and at bedtime for 14 days.   metoprolol tartrate 100 MG tablet Commonly known as: LOPRESSOR TAKE 1 TABLET BY MOUTH TWICE DAILY (BREAKFAST, BEDTIME) What changed: See the new instructions.   nystatin cream Commonly known as: MYCOSTATIN Apply 1 Application topically 2 (two) times daily. Affected areas on breast fold and groin and sacral areas   pantoprazole 40 MG tablet Commonly known as: PROTONIX TAKE 1 TABLET BY MOUTH EVERY MORNING What changed: when to take this   polyethylene glycol powder 17 GM/SCOOP powder Commonly known as: GLYCOLAX/MIRALAX Take 17 g by mouth 2 (two) times daily.   senna 8.6 MG tablet Commonly known as: SENOKOT Take 8.6 mg by mouth as needed for constipation.   tamsulosin 0.4 MG Caps capsule Commonly known as: FLOMAX TAKE 1 CAPSULE BY MOUTH AT BEDTIME   topiramate 25 MG tablet Commonly known as: TOPAMAX TAKE 1 TABLET(25 MG) BY MOUTH TWICE DAILY What changed: See the new instructions.   zinc oxide 11.3 % Crea cream Commonly known as: BALMEX Apply 1 Application topically 2 (two) times daily.        Follow-up Information     Ngetich, Dinah C, NP Follow up in 1 week(s).   Specialty: Family Medicine Why: Hospital follow up Contact information: [place] [id number] [phone]         Early Osmond, MD .   Specialty: Cardiology Contact information: [place] [address] [phone]                Discharge Exam: Danley Danker Weights   11/02/21 0109 11/02/21 2300  Weight: 81.6 kg 84.1 kg   General exam: Awake, laying in bed, in nad Respiratory system: Normal respiratory effort, no wheezing Cardiovascular system: regular rate, s1, s2 Gastrointestinal system: Soft, nondistended, positive BS Central nervous system: CN2-12 grossly intact, strength  intact Extremities: Perfused, no clubbing Skin: Normal skin turgor, no notable skin lesions seen Psychiatry: Mood normal // no visual hallucinations   Condition at discharge: fair  The results of significant diagnostics from this hospitalization (including imaging, microbiology, ancillary and laboratory) are listed below for reference.   Imaging Studies: DG CHEST PORT 1 VIEW  Result Date: 11/03/2021 CLINICAL DATA:  Aspiration into airway EXAM: PORTABLE CHEST 1 VIEW COMPARISON:  11/02/2021 FINDINGS: Normal heart size and mediastinal contours. Prior median sternotomy. Low volume chest. No acute infiltrate or edema. No effusion or pneumothorax. No acute osseous findings. IMPRESSION: No evidence of active disease. Electronically Signed   By: Roderic Palau  Watts M.D.   On: 11/03/2021 06:28   CT HEAD WO CONTRAST (5MM)  Result Date: 11/03/2021 CLINICAL DATA:  Transient ischemic attack.  History of stroke. EXAM: CT HEAD WITHOUT CONTRAST TECHNIQUE: Contiguous axial images were obtained from the base of the skull through the vertex without intravenous contrast. RADIATION DOSE REDUCTION: This exam was performed according to the departmental dose-optimization program which includes automated exposure control, adjustment of the mA and/or kV according to patient size and/or use of iterative reconstruction technique. COMPARISON:  08/29/2021. FINDINGS: Brain: No acute intracranial hemorrhage, midline shift or mass effect. No extra-axial fluid collection. Diffuse atrophy is noted. Periventricular white matter hypodensities are noted bilaterally. There are stable hypodensities and encephalomalacia in the cerebellar hemispheres, left temporal lobe, basal ganglia bilaterally, and left frontal lobe. No hydrocephalus. Vascular: Atherosclerotic calcification of the carotid siphons and vertebral arteries. No hyperdense vessel. Skull: Normal. Negative for fracture or focal lesion. Sinuses/Orbits: No acute finding. Other: None.  IMPRESSION: 1. No acute intracranial process. 2. Atrophy with chronic microvascular ischemic changes and old infarcts. Electronically Signed   By: Brett Fairy M.D.   On: 11/03/2021 03:44   DG Abd 1 View  Result Date: 11/02/2021 CLINICAL DATA:  Vomiting. EXAM: ABDOMEN - 1 VIEW COMPARISON:  08/29/2021. FINDINGS: The bowel gas pattern is normal. Surgical clips are present in the right upper quadrant. No radio-opaque calculi. Vascular calcifications are noted. IMPRESSION: No evidence of bowel obstruction. Electronically Signed   By: Brett Fairy M.D.   On: 11/02/2021 22:45   CT Chest Wo Contrast  Result Date: 11/02/2021 CLINICAL DATA:  51 year old female with shortness of breath and increasing cough. Recent treatment for pneumonia. Negative for COVID-19. EXAM: CT CHEST WITHOUT CONTRAST TECHNIQUE: Multidetector CT imaging of the chest was performed following the standard protocol without IV contrast. RADIATION DOSE REDUCTION: This exam was performed according to the departmental dose-optimization program which includes automated exposure control, adjustment of the mA and/or kV according to patient size and/or use of iterative reconstruction technique. COMPARISON:  Chest radiographs 0222 hours today and earlier. CT Abdomen and Pelvis 07/10/2021. FINDINGS: Cardiovascular: Repeat series 6 images are relatively motion free. 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 Extensive Calcified aortic atherosclerosis. Vascular patency is not evaluated in the absence of IV contrast. Aberrant origin of the right subclavian artery, normal variant. No cardiomegaly or pericardial effusion. Calcified atherosclerosis of the visible cervical carotid arteries. Mediastinum/Nodes: Negative noncontrast appearance of the mediastinum. Lungs/Pleura: Respiratory motion despite repeated imaging attempts. Major airways remain patent. There is mild platelike peribronchial opacity in both lower lobes. Additional dependent opacity in the  posterior basal segment of the left lower lobe. Azygous lobe and fissure, normal variant. No pleural effusion no other abnormal pulmonary opacity. Upper Abdomen: Negative visible noncontrast liver, spleen, stomach. Musculoskeletal: Prior sternotomy. Osteopenia. No acute osseous abnormality identified. IMPRESSION: 1. Mild motion artifact. Mild bilateral lower lobe pulmonary opacity more resembles atelectasis than infection. No pleural effusion. 2. Aortic Atherosclerosis (ICD10-I70.0). Prior sternotomy, possibly CABG. Electronically Signed   By: Genevie Ann M.D.   On: 11/02/2021 07:48   DG Chest Port 1 View  Result Date: 11/02/2021 CLINICAL DATA:  Dyspnea EXAM: PORTABLE CHEST 1 VIEW COMPARISON:  Radiographs 10/16/2021 FINDINGS: Low lung volumes. No focal consolidation, pleural effusion, or pneumothorax. Normal cardiomediastinal silhouette. No acute osseous abnormality. Azygous fissure. Sternotomy and mediastinal clips. IMPRESSION: No active disease. Electronically Signed   By: Placido Sou M.D.   On: 11/02/2021 02:36   DG Chest Port 1 View  Result Date:  10/16/2021 CLINICAL DATA:  Persistent cough for 2 weeks EXAM: PORTABLE CHEST 1 VIEW COMPARISON:  08/24/2021 FINDINGS: Previous median sternotomy noted. Normal heart size and vascularity. Azygos fissure in the right upper lobe, normal variant. No focal pneumonia, collapse or consolidation. Negative for edema, effusion or pneumothorax. Trachea midline. IMPRESSION: No active disease. Electronically Signed   By: Jerilynn Mages.  Shick M.D.   On: 10/16/2021 14:12    Microbiology: Results for orders placed or performed during the hospital encounter of 11/02/21  Resp Panel by RT-PCR (Flu A&B, Covid) Anterior Nasal Swab     Status: None   Collection Time: 11/02/21  1:46 AM   Specimen: Anterior Nasal Swab  Result Value Ref Range Status   SARS Coronavirus 2 by RT PCR NEGATIVE NEGATIVE Final    Comment: (NOTE) SARS-CoV-2 target nucleic acids are NOT DETECTED.  The  SARS-CoV-2 RNA is generally detectable in upper respiratory specimens during the acute phase of infection. The lowest concentration of SARS-CoV-2 viral copies this assay can detect is 138 copies/mL. A negative result does not preclude SARS-Cov-2 infection and should not be used as the sole basis for treatment or other patient management decisions. A negative result may occur with  improper specimen collection/handling, submission of specimen other than nasopharyngeal swab, presence of viral mutation(s) within the areas targeted by this assay, and inadequate number of viral copies(<138 copies/mL). A negative result must be combined with clinical observations, patient history, and epidemiological information. The expected result is Negative.  Fact Sheet for Patients:  EntrepreneurPulse.com.au  Fact Sheet for Healthcare Providers:  IncredibleEmployment.be  This test is no t yet approved or cleared by the [place] FDA and  has been authorized for detection and/or diagnosis of SARS-CoV-2 by FDA under an Emergency Use Authorization (EUA). This EUA will remain  in effect (meaning this test can be used) for the duration of the COVID-19 declaration under Section 564(b)(1) of the Act, 21 U.S.C.section 360bbb-3(b)(1), unless the authorization is terminated  or revoked sooner.       Influenza A by PCR NEGATIVE NEGATIVE Final   Influenza B by PCR NEGATIVE NEGATIVE Final    Comment: (NOTE) The Xpert Xpress SARS-CoV-2/FLU/RSV plus assay is intended as an aid in the diagnosis of influenza from Nasopharyngeal swab specimens and should not be used as a sole basis for treatment. Nasal washings and aspirates are unacceptable for Xpert Xpress SARS-CoV-2/FLU/RSV testing.  Fact Sheet for Patients: EntrepreneurPulse.com.au  Fact Sheet for Healthcare Providers: IncredibleEmployment.be  This test is not yet approved or  cleared by the [place] FDA and has been authorized for detection and/or diagnosis of SARS-CoV-2 by FDA under an Emergency Use Authorization (EUA). This EUA will remain in effect (meaning this test can be used) for the duration of the COVID-19 declaration under Section 564(b)(1) of the Act, 21 U.S.C. section 360bbb-3(b)(1), unless the authorization is terminated or revoked.  Performed at [facility], [place] [address] [place], [place] [address]   MRSA Next Gen by PCR, Nasal     Status: None   Collection Time: 11/02/21  5:10 AM   Specimen: Nasal Mucosa; Nasal Swab  Result Value Ref Range Status   MRSA by PCR Next Gen NOT DETECTED NOT DETECTED Final    Comment: (NOTE) The GeneXpert MRSA Assay (FDA approved for NASAL specimens only), is one component of a comprehensive MRSA colonization surveillance program. It is not intended to diagnose MRSA infection nor to guide or monitor treatment for MRSA infections. Test performance is not FDA  approved in patients less than 35 years old. Performed at [facility], [place] [address], [place], [place] [address]   Respiratory (~20 pathogens) panel by PCR     Status: None   Collection Time: 11/02/21  6:05 PM   Specimen: Nasopharyngeal Swab; Respiratory  Result Value Ref Range Status   Adenovirus NOT DETECTED NOT DETECTED Final   Coronavirus 229E NOT DETECTED NOT DETECTED Final    Comment: (NOTE) The Coronavirus on the Respiratory Panel, DOES NOT test for the novel  Coronavirus (2019 nCoV)    Coronavirus HKU1 NOT DETECTED NOT DETECTED Final   Coronavirus NL63 NOT DETECTED NOT DETECTED Final   Coronavirus OC43 NOT DETECTED NOT DETECTED Final   Metapneumovirus NOT DETECTED NOT DETECTED Final   Rhinovirus / Enterovirus NOT DETECTED NOT DETECTED Final   Influenza A NOT DETECTED NOT DETECTED Final   Influenza B NOT DETECTED NOT DETECTED Final   Parainfluenza Virus 1 NOT DETECTED NOT DETECTED Final    Parainfluenza Virus 2 NOT DETECTED NOT DETECTED Final   Parainfluenza Virus 3 NOT DETECTED NOT DETECTED Final   Parainfluenza Virus 4 NOT DETECTED NOT DETECTED Final   Respiratory Syncytial Virus NOT DETECTED NOT DETECTED Final   Bordetella pertussis NOT DETECTED NOT DETECTED Final   Bordetella Parapertussis NOT DETECTED NOT DETECTED Final   Chlamydophila pneumoniae NOT DETECTED NOT DETECTED Final   Mycoplasma pneumoniae NOT DETECTED NOT DETECTED Final    Comment: Performed at [facility], [place]. [address], [place], [place] [address]  Culture, blood (Routine X 2) w Reflex to ID Panel     Status: None (Preliminary result)   Collection Time: 11/03/21  8:10 AM   Specimen: BLOOD  Result Value Ref Range Status   Specimen Description   Final    BLOOD BLOOD LEFT HAND Performed at [place] [address], [place], [place] [address]    Special Requests   Final    BOTTLES DRAWN AEROBIC ONLY Blood Culture results may not be optimal due to an inadequate volume of blood received in culture bottles Performed at [place] [address] [place], [place] [address]    Culture   Final    NO GROWTH 4 DAYS Performed at [facility], [place] [address], [place], [place] [address]    Report Status PENDING  Incomplete  Culture, blood (Routine X 2) w Reflex to ID Panel     Status: None (Preliminary result)   Collection Time: 11/03/21  8:10 AM   Specimen: BLOOD  Result Value Ref Range Status   Specimen Description   Final    BLOOD BLOOD LEFT HAND Performed at [place] [address], [place], [place] [address]    Special Requests   Final    BOTTLES DRAWN AEROBIC ONLY Blood Culture results may not be optimal due to an inadequate volume of blood received in culture bottles Performed at [place] [address], [place], [place] [address]    Culture   Final    NO GROWTH 4 DAYS Performed at [facility], [place] [address], [place], [place] [address]    Report Status PENDING  Incomplete  Urine Culture     Status: Abnormal   Collection Time: 11/03/21  8:38 AM   Specimen: In/Out Cath Urine  Result Value Ref Range Status   Specimen Description   Final    IN/OUT CATH URINE Performed at [facility],  [place] [address], [place], [place] [address]    Special Requests   Final    NONE Performed at [facility], [place] [address], [place], [place] [address]    Culture MULTIPLE SPECIES PRESENT, SUGGEST RECOLLECTION (A)  Final   Report Status 11/04/2021 FINAL  Final    Labs: CBC: Recent Labs  Lab 11/02/21 0146 11/02/21 2329 11/03/21 0451 11/04/21 0317 11/05/21 0259 11/06/21 0307  WBC 9.4 13.2* 17.2* 16.8* 11.7* 8.6  NEUTROABS 6.2  --   --  15.0* 7.9* 5.0  HGB 12.6 12.9 13.1 11.8* 12.5 13.0  HCT 39.2 39.0 40.3 37.9 38.3 39.1  MCV 79.4* 78.2* 79.3* 82.0 78.6* 77.4*  PLT 361 323 304 292 318 053   Basic Metabolic Panel: Recent Labs  Lab 11/02/21 2329 11/03/21 0451 11/03/21 0811 11/04/21 0317 11/05/21 0259 11/06/21 0307  NA 137 138  --  137 138 139  K 3.7 4.0  --  3.9 3.3* 3.5  CL 107 106  --  112* 112* 112*  CO2 20* 20*  --  18* 20* 21*  GLUCOSE 213* 232*  --  233* 113* 93  BUN 11 10  --  18 20 19   CREATININE 0.68 0.63  --  0.64 0.69 0.55  CALCIUM 8.7* 8.6*  --  8.0* 8.3* 8.4*  MG 1.7  --  1.7 2.2 2.1  --    Liver Function Tests: Recent Labs  Lab 11/02/21 0146 11/02/21 2329 11/03/21 0451  AST 12* 11* 15  ALT 15 13 12   ALKPHOS 77 77 77  BILITOT 0.5 1.0 1.0  PROT 6.9 7.5 7.2  ALBUMIN 3.3* 3.6 3.4*   CBG: Recent Labs  Lab 11/06/21 1610 11/06/21 1612 11/06/21 2245 11/07/21 0803 11/07/21 1156  GLUCAP 426* 381* 287* 180* 208*    Discharge time spent: less than 30 minutes.  Signed: Marylu Lund, MD [facility] 11/07/2021

## 2021-11-07 NOTE — Care Management Important Message (Signed)
Important Message  Patient Details IM Letter given Name: Lori [name] MRN: [id number] Date of Birth: [birth year]   Medicare Important Message Given:  Yes     Kerin Salen 11/07/2021, 8:49 AM

## 2021-11-07 NOTE — Inpatient Diabetes Management (Signed)
Inpatient Diabetes Program Recommendations  AACE/ADA: New Consensus Statement on Inpatient Glycemic Control (2015)  Target Ranges:  Prepandial:   less than 140 mg/dL      Peak postprandial:   less than 180 mg/dL (1-2 hours)      Critically ill patients:  140 - 180 mg/dL   Lab Results  Component Value Date   GLUCAP 180 (H) 11/07/2021   HGBA1C 10.5 (H) 10/23/2021    Review of Glycemic Control  Latest Reference Range & Units 11/06/21 22:45 11/07/21 08:03  Glucose-Capillary 70 - 99 mg/dL 287 (H) 180 (H)  (H): Data is abnormally high Diabetes history: Type 2 DM Outpatient Diabetes medications: Lantus 38 units QHS, Novolog 4 units TID Current orders for Inpatient glycemic control: Semglee 30 units QD, Novolog 0-15 units TID Prednisone 40 mg completed  Inpatient Diabetes Program Recommendations:    Spoke with patient at bedside and husband by phone to discuss outpatient diabetes management. Patient and husband both verify insulin doses. Reviewed patient's current A1c of 10.5%. Explained what a A1c is and what it measures. Also reviewed goal A1c with patient, importance of good glucose control @ home, and blood sugar goals. Reviewed patho of DM, need for improved control, impact of steroids, survival skills, interventions, vascular changes and commorbidities. Patient has a meter and testing supplies, however, does not check her blood sugars. Husband states, "this is something from now on we will work on." Reviewed frequency and when to call MD. Husband plans to make patient a follow up appointment.  Admits to drinking sugary beverages. Husband states, "I know we need to stop; I will try to help out with this." Reviewed alternatives and goal setting and encouraged CHO mindfulness.  Patient and husband has no further questions at this time.   Thanks, Bronson Curb, MSN, RNC-OB Diabetes Coordinator [phone] (8a-5p)

## 2021-11-07 NOTE — TOC Progression Note (Addendum)
Transition of Care [facility]) - Progression Note    Patient Details  Name: Kemya Shed MRN: [id number] Date of Birth: 13-Apr-1970  Transition of Care [facility] CM/SW Contact  Leeroy Cha, RN Phone Number: 11/07/2021, 11:16 AM  Clinical Narrative:    [facility].  Expected Discharge Plan: [place] Barriers to Discharge: Continued Medical Work up  Expected Discharge Plan and Services Expected Discharge Plan: [place]   Discharge Planning Services: CM Consult Post Acute Care Choice: [place] arrangements for the past 2 months: Single Family Home                           HH Arranged: PT, OT, Nurse's Aide, Speech Therapy HH Agency: [place] ([facility]) Date HH Agency Contacted: 11/07/21 Time [place]: 1115 Representative spoke with at [place]: [place] ([place]) Interventions    Readmission Risk Interventions   Row Labels 08/29/2021    2:32 PM 07/18/2021   12:43 PM 07/11/2021    1:22 PM  Readmission Risk Prevention Plan   Section Header. No data exists in this row.     Transportation Screening   Complete Complete Complete  Medication Review Press photographer)   Complete Complete Complete  PCP or Specialist appointment within 3-5 days of discharge   Complete Complete   HRI or Home Care Consult   Complete Complete Complete  SW Recovery Care/Counseling Consult   Complete Complete Complete  Palliative Care Screening   Not Applicable Not Applicable Not Applicable  Skilled Nursing Facility   Complete Complete Complete

## 2021-11-07 NOTE — Hospital Course (Signed)
51 year old female history of type 2 diabetes, CVA with residual left-sided hemiparesis, CAD, anxiety, hypertension, hyperlipidemia, GERD presented to the ED with cough x2 weeks, shortness of breath with no improvement despite nebs and steroids in the outpatient setting.  Patient had a referral to see pulmonology but symptoms worsened and presented to the ED.  Patient admitted with dyspnea and cough, placed on nebs and steroids.  CT chest done with bibasilar atelectasis versus pneumonia.  Procalcitonin was negative.  Urinalysis also concerning for UTI.  On hospital day 1 while in the stepdown unit due to concerns for neurological change and worsening aphasia head CT was obtained which was negative for any acute abnormalities.  Patient noted to have nausea and emesis on initial presentation.

## 2021-11-07 NOTE — Progress Notes (Addendum)
Physical Therapy Treatment Patient Details Name: Lori [name] MRN: [id number] DOB: 05-08-1970 Today's Date: 11/07/2021   History of Present Illness Lori [name] is a 51 y.o. female admitted 11/02/21 with cough x2 weeks, SOB with no improvement despite nebs and steroids in the outpatient setting, while admitted experienced nausea and emesis, neurological change. CT negative for acute abnormalities. PMH includes DM2, CVA w/ left side hemiparesis, CAD, anxiety, HTN, HLD, GERD.    PT Comments    Pt is somewhat lethargic, she stated she's groggy due to recently having Ativan, she was able to verbally respond to questions and follow commands. Increased assistance needed for mobility today, +2 total assist for supine to sit, +2 mod assist for sit to stand x 2 trials with RW. Pt only able to stand briefly at edge of bed due to poor posture (trunk flexed) and buckling of LLE. We attempted side stepping at the edge of the bed but she was unable to weight shift well enough to do so.    Recommendations for follow up therapy are one component of a multi-disciplinary discharge planning process, led by the attending physician.  Recommendations may be updated based on patient status, additional functional criteria and insurance authorization.  Follow Up Recommendations  Skilled nursing-short term rehab (<3 hours/day) Can patient physically be transported by private vehicle: No   Assistance Recommended at Discharge Frequent or constant Supervision/Assistance  Patient can return home with the following Help with stairs or ramp for entrance;Assist for transportation;Assistance with cooking/housework;Direct supervision/assist for medications management;A lot of help with bathing/dressing/bathroom;2 people to help with walking and/or transfers   Equipment Recommendations  None recommended by PT    Recommendations for Other Services       Precautions / Restrictions Precautions Precautions:  Fall Precaution Comments: L side hemi prior CVA Restrictions Weight Bearing Restrictions: No     Mobility  Bed Mobility Overal bed mobility: Needs Assistance Bed Mobility: Supine to Sit, Sit to Supine, Rolling Rolling: Total assist, +2 for physical assistance   Supine to sit: +2 for physical assistance, Total assist Sit to supine: Total assist, +2 for physical assistance   General bed mobility comments: Asssit for trunk and bil LEs. Utilized bedpad for scooting, positioning at EOB. Increased time. Cues provided. +2 to roll L and R for placement of diaper    Transfers Overall transfer level: Needs assistance Equipment used: Rolling walker (2 wheels) Transfers: Sit to/from Stand Sit to Stand: Mod assist, +2 physical assistance, +2 safety/equipment           General transfer comment: Assist to rise, steady, control descent. Cues for safety, hand placement. sit to stand x 2 trials with RW. BLEs buckled in standing    Ambulation/Gait Ambulation/Gait assistance: Mod assist, +2 physical assistance, +2 safety/equipment Gait Distance (Feet): 1 Feet Assistive device: Rolling walker (2 wheels) Gait Pattern/deviations: Step-to pattern, Trunk flexed       General Gait Details: 1 side step along bedside with RW. pt had L knee buckling. +2 assist to stabilize throughout. VCs to raise trunk and to push thru [place] for support. distance limited by difficulty weight shifting and L knee buckling   Stairs             Wheelchair Mobility    Modified Rankin (Stroke Patients Only)       Balance Overall balance assessment: Needs assistance, History of Falls Sitting-balance support: No upper extremity supported, Single extremity supported, Feet supported Sitting balance-Leahy Scale: [place]     Standing  balance support: Bilateral upper extremity supported, During functional activity, Reliant on assistive device for balance Standing balance-Leahy Scale: Poor                               Cognition Arousal/Alertness: Lethargic, Suspect due to medications (pt stated she's groggy from Ativan) Behavior During Therapy: WFL for tasks assessed/performed Overall Cognitive Status: Within Functional Limits for tasks assessed                                          Exercises      General Comments        Pertinent Vitals/Pain Pain Assessment Faces Pain Scale: Hurts little more Pain Location: low back Pain Intervention(s): Repositioned, Limited activity within patient's tolerance, Monitored during session    Home Living                          Prior Function            PT Goals (current goals can now be found in the care plan section) Acute Rehab PT Goals Patient Stated Goal: to return home PT Goal Formulation: With patient Time For Goal Achievement: 11/19/21 Potential to Achieve Goals: Good Progress towards PT goals: Not progressing toward goals - comment (increased assistance needed)    Frequency    Min 3X/week      PT Plan Discharge plan needs to be updated    Co-evaluation              AM-PAC PT "6 Clicks" Mobility   Outcome Measure  Help needed turning from your back to your side while in a flat bed without using bedrails?: Total Help needed moving from lying on your back to sitting on the side of a flat bed without using bedrails?: Total Help needed moving to and from a bed to a chair (including a wheelchair)?: Total Help needed standing up from a chair using your arms (e.g., wheelchair or bedside chair)?: Total Help needed to walk in hospital room?: Total Help needed climbing 3-5 steps with a railing? : Total 6 Click Score: 6    End of Session Equipment Utilized During Treatment: Gait belt Activity Tolerance: Patient limited by fatigue Patient left: in bed;with bed alarm set;with call bell/phone within reach Nurse Communication: Mobility status;Other (comment) (pt wants help eating lunch) PT  Visit Diagnosis: Muscle weakness (generalized) (M62.81);Difficulty in walking, not elsewhere classified (R26.2);Other symptoms and signs involving the nervous system (R29.898)     Time: 0762-2633 PT Time Calculation (min) (ACUTE ONLY): 23 min  Charges:  $Therapeutic Activity: 23-37 mins                     Blondell Reveal Kistler PT 11/07/2021  Acute Rehabilitation Services  Office [phone]

## 2021-11-08 LAB — CULTURE, BLOOD (ROUTINE X 2)
Culture: NO GROWTH
Culture: NO GROWTH

## 2021-11-14 ENCOUNTER — Encounter: Payer: Medicare Other | Admitting: Family

## 2021-11-15 ENCOUNTER — Encounter: Payer: Medicare Other | Admitting: Family

## 2021-11-15 NOTE — Progress Notes (Deleted)
Synopsis: Referred for dyspnea by Ngetich, Nelda Bucks, NP  Subjective:   PATIENT ID: Lori [name] GENDER: female DOB: 05-18-1970, MRN: [id number]  No chief complaint on file.  51yF with history of CAD, DM, HTN, pheo, stroke with left hemiparesis, hyperthyroid referred for dyspnea  Otherwise pertinent review of systems is negative.  Past Medical History:  Diagnosis Date   CAD (coronary artery disease)    Depression    Diabetes mellitus without complication ([place])    History of CT scan    History of mammogram    History of MRI    Hypertension    Pheochromocytoma    Pheochromocytoma    Stroke ([place])    Thyroid disease      Family History  Problem Relation Age of Onset   Diabetes Mother    Hypertension Father    Heart attack Father    Dementia Father    Diabetes Brother    Brain cancer Brother    Asthma Daughter    Sickle cell anemia Son      Past Surgical History:  Procedure Laterality Date   ABDOMINAL HYSTERECTOMY     CESAREAN SECTION     3   CORONARY ARTERY BYPASS GRAFT     Right adrenal gland removal for pheochromocytoma Right     Social History   Socioeconomic History   Marital status: Married    Spouse name: Not on file   Number of children: Not on file   Years of education: Not on file   Highest education level: Not on file  Occupational History   Not on file  Tobacco Use   Smoking status: Never   Smokeless tobacco: Never  Vaping Use   Vaping Use: Never used  Substance and Sexual Activity   Alcohol use: Not Currently   Drug use: Never   Sexual activity: Not on file  Other Topics Concern   Not on file  Social History Narrative   Tobacco use, amount per day now: Never   Past tobacco use, amount per day: Never   How many years did you use tobacco: Never   Alcohol use (drinks per week): Not Currently.   Diet: Eat out a lot.    Do you drink/eat things with caffeine: Sweet Tea   Marital status:   Married                               What  year were you married? 2000   Do you live in a house, apartment, assisted living, condo, trailer, etc.? House   Is it one or more stories? 1   How many persons live in your home? 2   Do you have pets in your home?( please list) No   Highest Level of education completed? Bachelors Degree.   Current or past profession: Location manager, [employer].   Do you exercise?   No                               Type and how often?   Do you have a living will? No   Do you have a DNR form?       No                            If not, do you  want to discuss one?   Do you have signed POA/HPOA forms?   No                     If so, please bring to you appointment      Do you have any difficulty bathing or dressing yourself? Yes   Do you have any difficulty preparing food or eating? Yes   Do you have any difficulty managing your medications? Yes   Do you have any difficulty managing your finances? No   Do you have any difficulty affording your medications? Yes   Social Determinants of Health   Financial Resource Strain: Not on file  Food Insecurity: Not on file  Transportation Needs: Not on file  Physical Activity: Not on file  Stress: Not on file  Social Connections: Not on file  Intimate Partner Violence: Not At Risk (11/02/2021)   Humiliation, Afraid, Rape, and Kick questionnaire    Fear of Current or Ex-Partner: No    Emotionally Abused: No    Physically Abused: No    Sexually Abused: No     Allergies  Allergen Reactions   Contrast Media [Iodinated Contrast Media] Anaphylaxis and Swelling   Sumatriptan Anaphylaxis   Morphine Swelling   Penicillins Swelling    Mouth swells up and eyes swollen shut     Outpatient Medications Prior to Visit  Medication Sig Dispense Refill   acetaminophen (TYLENOL) 325 MG tablet Take 2 tablets (650 mg total) by mouth every 6 (six) hours as needed for mild pain or headache. (Patient not taking: Reported on 11/02/2021)      albuterol (PROVENTIL) (2.5 MG/3ML) 0.083% nebulizer solution Take 3 mLs (2.5 mg total) by nebulization every 6 (six) hours as needed for wheezing or shortness of breath. (Patient not taking: Reported on 11/02/2021) 75 mL 12   amLODipine (NORVASC) 10 MG tablet Take 1 tablet (10 mg total) by mouth daily. 30 tablet 0   atorvastatin (LIPITOR) 80 MG tablet Take 80 mg by mouth daily.     blood glucose meter kit and supplies KIT Dispense based on patient and insurance preference. Use up to four times daily as directed. 1 each 0   budesonide (PULMICORT) 0.25 MG/2ML nebulizer solution Take 2 mLs (0.25 mg total) by nebulization 2 (two) times daily. (Patient not taking: Reported on 11/02/2021) 60 mL 12   clopidogrel (PLAVIX) 75 MG tablet Take 1 tablet (75 mg total) by mouth daily. 30 tablet 0   Continuous Blood Gluc Receiver (DEXCOM G5 RECEIVER KIT) DEVI 1 Device by Does not apply route 4 (four) times daily. 1 each 5   diclofenac Sodium (VOLTAREN) 1 % GEL Apply 4 g topically 4 (four) times daily. (Patient taking differently: Apply 1 Application topically as needed (pain). Knees and hands) 200 g 0   diphenhydrAMINE (BENADRYL) 25 MG tablet Take 25 mg by mouth at bedtime.     escitalopram (LEXAPRO) 20 MG tablet TAKE 1 TABLET BY MOUTH EVERY MORNING (Patient taking differently: Take 20 mg by mouth daily.) 30 tablet 2   ezetimibe (ZETIA) 10 MG tablet Take 1 tablet (10 mg total) by mouth daily. 90 tablet 1   gabapentin (NEURONTIN) 100 MG capsule TAKE 1 CAPSULE BY MOUTH THREE TIMES DAILY (BREAKFAST, LUNCH, BEDTIME) (Patient taking differently: Take 100 mg by mouth 3 (three) times daily.) 270 capsule 2   hydrOXYzine (ATARAX) 25 MG tablet Take 1 tablet (25 mg total) by mouth at bedtime as needed for anxiety. (Patient taking differently: Take  25 mg by mouth at bedtime.) 30 tablet 0   Insulin Aspart FlexPen (NOVOLOG) 100 UNIT/ML ADMINISTER 4 UNITS UNDER THE SKIN THREE TIMES DAILY WITH MEALS (Patient taking differently:  Inject 7 Units into the skin in the morning, at noon, and at bedtime.) 15 mL 3   insulin glargine (LANTUS SOLOSTAR) 100 UNIT/ML Solostar Pen Inject 35 Units into the skin at bedtime. (Patient taking differently: Inject 38 Units into the skin at bedtime.) 15 mL 4   Insulin Pen Needle 32G X 4 MM MISC Use to inject insulin 4 times daily as directed. 200 each 0   ipratropium-albuterol (DUONEB) 0.5-2.5 (3) MG/3ML SOLN Take 3 mLs by nebulization 2 (two) times daily. (Patient taking differently: Take 3 mLs by nebulization as needed (wheezing/shortness of breath).) 360 mL 5   LORazepam (ATIVAN) 0.5 MG tablet TAKE 1 TABLET BY MOUTH AT BEDTIME (Patient taking differently: Take 0.5 mg by mouth at bedtime.) 30 tablet 2   melatonin 3 MG TABS tablet Take 1 tablet (3 mg total) by mouth at bedtime. (Patient taking differently: Take 9 mg by mouth at bedtime.) 30 tablet 0   methimazole (TAPAZOLE) 5 MG tablet TAKE 1 TABLET BY MOUTH EVERY MORNING (Patient taking differently: Take 5 mg by mouth daily.) 90 tablet 2   metoCLOPramide (REGLAN) 10 MG/10ML SOLN Take 5 mLs (5 mg total) by mouth 4 (four) times daily -  before meals and at bedtime for 14 days. 280 mL 0   metoprolol tartrate (LOPRESSOR) 100 MG tablet TAKE 1 TABLET BY MOUTH TWICE DAILY (BREAKFAST, BEDTIME) (Patient taking differently: Take 100 mg by mouth 2 (two) times daily.) 180 tablet 2   nystatin cream (MYCOSTATIN) Apply 1 Application topically 2 (two) times daily. Affected areas on breast fold and groin and sacral areas 30 g 0   pantoprazole (PROTONIX) 40 MG tablet TAKE 1 TABLET BY MOUTH EVERY MORNING (Patient taking differently: Take 40 mg by mouth daily.) 90 tablet 2   polyethylene glycol powder (GLYCOLAX/MIRALAX) 17 GM/SCOOP powder Take 17 g by mouth 2 (two) times daily. (Patient not taking: Reported on 11/02/2021) 476 g 0   senna (SENOKOT) 8.6 MG tablet Take 8.6 mg by mouth as needed for constipation.     tamsulosin (FLOMAX) 0.4 MG CAPS capsule TAKE 1 CAPSULE  BY MOUTH AT BEDTIME (Patient taking differently: Take 0.4 mg by mouth at bedtime.) 90 capsule 2   topiramate (TOPAMAX) 25 MG tablet TAKE 1 TABLET(25 MG) BY MOUTH TWICE DAILY (Patient taking differently: Take 25 mg by mouth 2 (two) times daily.) 180 tablet 1   zinc oxide (BALMEX) 11.3 % CREA cream Apply 1 Application topically 2 (two) times daily. 113 g 5   No facility-administered medications prior to visit.       Objective:   Physical Exam:  General appearance: 51 y.o., female, NAD, conversant  Eyes: anicteric sclerae; PERRL, tracking appropriately HENT: NCAT; MMM Neck: Trachea midline; no lymphadenopathy, no JVD Lungs: CTAB, no crackles, no wheeze, with normal respiratory effort CV: RRR, no murmur  Abdomen: Soft, non-tender; non-distended, BS present  Extremities: No peripheral edema, warm Skin: Normal turgor and texture; no rash Psych: Appropriate affect Neuro: Alert and oriented to person and place, no focal deficit     There were no vitals filed for this visit.   on *** LPM *** RA BMI Readings from Last 3 Encounters:  11/02/21 33.91 kg/m  10/31/21 34.97 kg/m  10/22/21 34.97 kg/m   Wt Readings from Last 3 Encounters:  11/02/21 185 lb  6.5 oz (84.1 kg)  10/22/21 191 lb 3.2 oz (86.7 kg)  08/24/21 187 lb (84.8 kg)     CBC    Component Value Date/Time   WBC 8.6 11/06/2021 0307   RBC 5.05 11/06/2021 0307   HGB 13.0 11/06/2021 0307   HCT 39.1 11/06/2021 0307   PLT 287 11/06/2021 0307   MCV 77.4 (L) 11/06/2021 0307   MCH 25.7 (L) 11/06/2021 0307   MCHC 33.2 11/06/2021 0307   RDW 16.0 (H) 11/06/2021 0307   LYMPHSABS 3.0 11/06/2021 0307   MONOABS 0.6 11/06/2021 0307   EOSABS 0.0 11/06/2021 0307   BASOSABS 0.0 11/06/2021 0307    ***  Chest Imaging: CT Chest 11/02/21 reviewed by me with lots of motion limiting assessment but has a couple foci of mucus plugging and atelectasis probably  Pulmonary Functions Testing Results:     No data to display             Echocardiogram 01/2021:    1. Left ventricular ejection fraction, by estimation, is 60 to 65%. The  left ventricle has normal function. The left ventricle has no regional  wall motion abnormalities. There is moderate left ventricular hypertrophy.  Left ventricular diastolic  parameters are consistent with Grade I diastolic dysfunction (impaired  relaxation).   2. Right ventricular systolic function is normal. The right ventricular  size is normal.   3. The mitral valve is normal in structure. No evidence of mitral valve  regurgitation. No evidence of mitral stenosis.   4. Tricuspid valve regurgitation is mild to moderate.   5. The aortic valve is normal in structure. Aortic valve regurgitation is  not visualized. Aortic valve sclerosis is present, with no evidence of  aortic valve stenosis.   6. The inferior vena cava is normal in size with greater than 50%  respiratory variability, suggesting right atrial pressure of 3 mmHg.   Heart Catheterization: ***    Assessment & Plan:    Plan:      Maryjane Hurter, MD [facility] 11/15/2021 6:33 PM

## 2021-11-15 NOTE — Progress Notes (Signed)
  This encounter was created in error - please disregard. No show 

## 2021-11-16 ENCOUNTER — Telehealth: Payer: Self-pay

## 2021-11-16 ENCOUNTER — Institutional Professional Consult (permissible substitution): Payer: Medicaid Other | Admitting: Student

## 2021-11-16 DIAGNOSIS — R471 Dysarthria and anarthria: Secondary | ICD-10-CM

## 2021-11-16 NOTE — Telephone Encounter (Signed)
Debby Bud from [facility] called and asked for verbal orders. She states that patient needs speech therapy once a week for 5 weeks. I confirmed. Message sent to PCP Ngetich, Nelda Bucks, NP as Juluis Rainier. No further action is required.

## 2021-11-16 NOTE — Telephone Encounter (Signed)
Speech Therapy ordered.

## 2021-11-16 NOTE — Addendum Note (Signed)
Addended byMarlowe Sax C on: 11/16/2021 02:37 PM   Modules accepted: Orders

## 2021-11-22 ENCOUNTER — Ambulatory Visit (INDEPENDENT_AMBULATORY_CARE_PROVIDER_SITE_OTHER): Payer: Medicaid Other | Admitting: Adult Health

## 2021-11-22 ENCOUNTER — Encounter: Payer: Self-pay | Admitting: Adult Health

## 2021-11-22 ENCOUNTER — Telehealth: Payer: Self-pay | Admitting: Physical Medicine and Rehabilitation

## 2021-11-22 VITALS — BP 120/80 | HR 92 | Temp 97.0°F | Resp 18 | Ht 62.0 in

## 2021-11-22 DIAGNOSIS — E785 Hyperlipidemia, unspecified: Secondary | ICD-10-CM

## 2021-11-22 DIAGNOSIS — M62838 Other muscle spasm: Secondary | ICD-10-CM

## 2021-11-22 DIAGNOSIS — N390 Urinary tract infection, site not specified: Secondary | ICD-10-CM

## 2021-11-22 DIAGNOSIS — I639 Cerebral infarction, unspecified: Secondary | ICD-10-CM

## 2021-11-22 DIAGNOSIS — K219 Gastro-esophageal reflux disease without esophagitis: Secondary | ICD-10-CM

## 2021-11-22 DIAGNOSIS — I10 Essential (primary) hypertension: Secondary | ICD-10-CM

## 2021-11-22 DIAGNOSIS — M2550 Pain in unspecified joint: Secondary | ICD-10-CM

## 2021-11-22 DIAGNOSIS — R0600 Dyspnea, unspecified: Secondary | ICD-10-CM

## 2021-11-22 DIAGNOSIS — E1169 Type 2 diabetes mellitus with other specified complication: Secondary | ICD-10-CM

## 2021-11-22 MED ORDER — PREDNISONE 20 MG PO TABS: 20.0000 mg | ORAL_TABLET | Freq: Every day | ORAL | 0 refills | Status: DC

## 2021-11-22 MED ORDER — PREDNISONE 20 MG PO TABS: 20.0000 mg | ORAL_TABLET | Freq: Two times a day (BID) | ORAL | 0 refills | Status: AC

## 2021-11-22 MED ORDER — CYCLOBENZAPRINE HCL 5 MG PO TABS: 5.0000 mg | ORAL_TABLET | Freq: Two times a day (BID) | ORAL | 0 refills | Status: DC | PRN

## 2021-11-22 MED ORDER — PANTOPRAZOLE SODIUM 40 MG PO TBEC: 40.0000 mg | DELAYED_RELEASE_TABLET | Freq: Every morning | ORAL | 2 refills | Status: DC

## 2021-11-22 MED ORDER — PREDNISONE 20 MG PO TABS: 20.0000 mg | ORAL_TABLET | Freq: Every day | ORAL | 0 refills | Status: AC

## 2021-11-22 NOTE — Patient Instructions (Signed)
Arthritis ?Arthritis means joint pain. It can also mean joint disease. A joint is a place where bones come together. There are more than 100 types of arthritis. ?What are the causes? ?Wear and tear of a joint. This is the most common cause. ?Too much of a chemical called uric acid in the blood, which leads to pain in the joint (gout). ?Pain and swelling (inflammation) in a joint. ?Infection of a joint. ?Injuries in the joint. ?A reaction to medicines (allergy). ?In some cases, the cause may not be known. ?What are the signs or symptoms? ?Pain in a joint when moving. ?Redness at a joint. ?Swelling at a joint. ?Stiffness at a joint. ?Warmth coming from the joint. ?A fever. ?A feeling of being sick. ?How is this treated? ?This condition may be treated with: ?Treating the cause, if it is known. ?Rest. ?Raising (elevating) the joint. ?Putting cold or hot packs on the joint. ?Medicines to treat symptoms and reduce pain and swelling. ?Shots (injections) of medicines, such as cortisone, into the joint. ?You may also be told to make changes in your life, such as doing exercises and losing weight. ?Follow these instructions at home: ?Medicines ?Take over-the-counter and prescription medicines only as told by your doctor. ?Do not take aspirin for pain if your doctor says that you may have gout. ?Activity ?Rest your joint if your doctor tells you to. ?Avoid activities that make the pain worse. ?Exercise your joint regularly as told by your doctor. Try doing exercises like: ?Swimming. ?Water aerobics. ?Biking. ?Walking. ?Managing pain, stiffness, and swelling ? ?  ? ?If told, put ice on the affected area. To do this: ?Put ice in a plastic bag. ?Place a towel between your skin and the bag. ?Leave the ice on for 20 minutes, 2-3 times a day. ?Take off the ice if your skin turns bright red. This is very important. If you cannot feel pain, heat, or cold, you have a greater risk of damage to the area. ?If your joint is swollen, raise  (elevate) it above the level of your heart if told by your doctor. ?If your joint feels stiff in the morning, try taking a warm shower. ?If told, put heat on the affected area. Do this as often as told by your doctor. Use the heat source that your doctor recommends, such as a moist heat pack or a heating pad. If you have diabetes, do not apply heat without asking your doctor. To apply heat: ?Place a towel between your skin and the heat source. ?Leave the heat on for 20-30 minutes. ?Take off the heat if your skin turns bright red. This is very important. If you cannot feel pain, heat, or cold, you have a greater risk of getting burned. ?General instructions ?Maintain a healthy weight. Follow instructions from your doctor for weight control. ?Do not smoke or use any products that contain nicotine or tobacco. If you need help quitting, ask your doctor. ?Keep all follow-up visits. ?Where to find more information ?National Institutes of Health: www.niams.nih.gov ?Contact a doctor if: ?The pain gets worse. ?You have a fever. ?Get help right away if: ?You have very bad pain in your joint. ?You have swelling in your joint. ?Your joint is red. ?Many joints become painful and swollen. ?You have very bad back pain. ?Your leg is very weak. ?Summary ?Arthritis means joint pain. It can also mean joint disease. A joint is a place where bones come together. ?The most common cause of this condition is wear and   tear of a joint. ?Symptoms of this condition include redness, swelling, or stiffness of the joint. ?This condition is treated with rest, raising the joint, medicines, and putting cold or hot packs on the joint. ?Follow your doctor's instructions about medicines, activity, exercises, and other home care treatments. ?This information is not intended to replace advice given to you by your health care provider. Make sure you discuss any questions you have with your health care provider. ?Document Revised: 10/31/2020 Document  Reviewed: 10/31/2020 ?Elsevier Patient Education ? 2023 Elsevier Inc. ? ?

## 2021-11-22 NOTE — Telephone Encounter (Signed)
Patient just called office---her phone was acting up and it was like I was speaking to myself, repeating everything I said.  All I could get out of patient was that something was wrong with her arm and hand.  Can someone please follow up with patient?

## 2021-11-22 NOTE — Progress Notes (Signed)
[facility]  Provider:  Durenda Age DNP  Code Status:  Full Code  Goals of Care:     11/22/2021    1:36 PM  Advanced Directives  Does Patient Have a Medical Advance Directive? No  Would patient like information on creating a medical advance directive? No - Patient declined     Chief Complaint  Patient presents with   Transitions Of Care    TOC 11/02/2021-11/08/2021.     HPI: Patient is a 51 y.o. female for transition of care. She is accompanied today by her husband. She is complained of left hand pain. She requested for Flexeril. 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  She was hospitalized 11/02/21 to 11/07/21 due to dyspnea. With unclear etiology. CT chest with bibasilar atelectasis versus pneumonia. O2 sat was 99%. ACE inhibitor was held and not to be resumed on discharge. She was treated with Atrovent, Xopenex and steroid taper. She was treated for UT with Rocephin and IV Diflucan which was transitioned to oral Diflucan upon discharge. She had hypertensive urgency and was treated with Cardene drip. BP improved and weaned off Cardene drip.   She is requesting refill of Protonix for her GERD.  Past Medical History:  Diagnosis Date   CAD (coronary artery disease)    Depression    Diabetes mellitus without complication ([place])    History of CT scan    History of mammogram    History of MRI    Hypertension    Pheochromocytoma    Pheochromocytoma    Stroke ([place])    Thyroid disease     Past Surgical History:  Procedure Laterality Date   ABDOMINAL HYSTERECTOMY     CESAREAN SECTION     3   CORONARY ARTERY BYPASS GRAFT     Right adrenal gland removal for pheochromocytoma Right     Allergies  Allergen Reactions   Contrast Media [Iodinated Contrast Media] Anaphylaxis and Swelling   Sumatriptan Anaphylaxis   Morphine Swelling   Penicillins Swelling    Mouth swells up and eyes swollen shut    Outpatient Encounter Medications as of 11/22/2021  Medication Sig    albuterol (PROVENTIL) (2.5 MG/3ML) 0.083% nebulizer solution Take 3 mLs (2.5 mg total) by nebulization every 6 (six) hours as needed for wheezing or shortness of breath.   amLODipine (NORVASC) 10 MG tablet Take 1 tablet (10 mg total) by mouth daily.   atorvastatin (LIPITOR) 80 MG tablet Take 80 mg by mouth daily.   blood glucose meter kit and supplies KIT Dispense based on patient and insurance preference. Use up to four times daily as directed.   budesonide (PULMICORT) 0.25 MG/2ML nebulizer solution Take 2 mLs (0.25 mg total) by nebulization 2 (two) times daily.   clopidogrel (PLAVIX) 75 MG tablet Take 1 tablet (75 mg total) by mouth daily.   Continuous Blood Gluc Receiver (DEXCOM G5 RECEIVER KIT) DEVI 1 Device by Does not apply route 4 (four) times daily.   diclofenac Sodium (VOLTAREN) 1 % GEL Apply 4 g topically 4 (four) times daily.   diphenhydrAMINE (BENADRYL) 25 MG tablet Take 25 mg by mouth at bedtime.   escitalopram (LEXAPRO) 20 MG tablet TAKE 1 TABLET BY MOUTH EVERY MORNING   ezetimibe (ZETIA) 10 MG tablet Take 1 tablet (10 mg total) by mouth daily.   gabapentin (NEURONTIN) 100 MG capsule TAKE 1 CAPSULE BY MOUTH THREE TIMES DAILY (BREAKFAST, LUNCH, BEDTIME)   hydrOXYzine (ATARAX) 25 MG tablet Take 1 tablet (25 mg total) by  mouth at bedtime as needed for anxiety.   insulin aspart (NOVOLOG) 100 UNIT/ML FlexPen Inject 7 Units into the skin 3 (three) times daily with meals.   insulin glargine (LANTUS) 100 UNIT/ML Solostar Pen Inject 38 Units into the skin at bedtime.   Insulin Pen Needle 32G X 4 MM MISC Use to inject insulin 4 times daily as directed.   ipratropium-albuterol (DUONEB) 0.5-2.5 (3) MG/3ML SOLN Take 3 mLs by nebulization 2 (two) times daily.   LORazepam (ATIVAN) 0.5 MG tablet TAKE 1 TABLET BY MOUTH AT BEDTIME   MELATONIN GUMMIES PO Take 9 mg by mouth at bedtime as needed.   methimazole (TAPAZOLE) 5 MG tablet TAKE 1 TABLET BY MOUTH EVERY MORNING   metoprolol tartrate  (LOPRESSOR) 100 MG tablet TAKE 1 TABLET BY MOUTH TWICE DAILY (BREAKFAST, BEDTIME)   nystatin cream (MYCOSTATIN) Apply 1 Application topically 2 (two) times daily. Affected areas on breast fold and groin and sacral areas   pantoprazole (PROTONIX) 40 MG tablet TAKE 1 TABLET BY MOUTH EVERY MORNING   senna (SENOKOT) 8.6 MG tablet Take 8.6 mg by mouth as needed for constipation.   tamsulosin (FLOMAX) 0.4 MG CAPS capsule TAKE 1 CAPSULE BY MOUTH AT BEDTIME   topiramate (TOPAMAX) 25 MG tablet TAKE 1 TABLET(25 MG) BY MOUTH TWICE DAILY   zinc oxide (BALMEX) 11.3 % CREA cream Apply 1 Application topically 2 (two) times daily.   metoCLOPramide (REGLAN) 10 MG/10ML SOLN Take 5 mLs (5 mg total) by mouth 4 (four) times daily -  before meals and at bedtime for 14 days.   [DISCONTINUED] acetaminophen (TYLENOL) 325 MG tablet Take 2 tablets (650 mg total) by mouth every 6 (six) hours as needed for mild pain or headache.   [DISCONTINUED] Insulin Aspart FlexPen (NOVOLOG) 100 UNIT/ML ADMINISTER 4 UNITS UNDER THE SKIN THREE TIMES DAILY WITH MEALS (Patient taking differently: 7 Units.)   [DISCONTINUED] insulin glargine (LANTUS SOLOSTAR) 100 UNIT/ML Solostar Pen Inject 35 Units into the skin at bedtime. (Patient taking differently: Inject 38 Units into the skin at bedtime.)   [DISCONTINUED] melatonin 3 MG TABS tablet Take 1 tablet (3 mg total) by mouth at bedtime. (Patient taking differently: Take 9 mg by mouth at bedtime.)   [DISCONTINUED] polyethylene glycol powder (GLYCOLAX/MIRALAX) 17 GM/SCOOP powder Take 17 g by mouth 2 (two) times daily. (Patient not taking: Reported on 11/02/2021)   No facility-administered encounter medications on file as of 11/22/2021.    Review of Systems:  Review of Systems  Constitutional:  Negative for appetite change, chills, fatigue and fever.  HENT:  Negative for congestion, hearing loss, rhinorrhea and sore throat.   Eyes: Negative.   Respiratory:  Negative for cough, shortness of breath  and wheezing.   Cardiovascular:  Negative for chest pain, palpitations and leg swelling.  Gastrointestinal:  Negative for abdominal pain, constipation, diarrhea, nausea and vomiting.  Genitourinary:  Negative for dysuria.  Musculoskeletal:  Negative for arthralgias, back pain and myalgias.       Left hand pain  Skin:  Negative for color change, rash and wound.  Neurological:  Negative for dizziness, weakness and headaches.  Psychiatric/Behavioral:  Negative for behavioral problems. The patient is not nervous/anxious.     Health Maintenance  Topic Date Due   COVID-19 Vaccine (1) Never done   FOOT EXAM  Never done   OPHTHALMOLOGY EXAM  Never done   Diabetic kidney evaluation - Urine ACR  Never done   TETANUS/TDAP  Never done   COLONOSCOPY (Pts 45-56yr Insurance coverage  will need to be confirmed)  Never done   INFLUENZA VACCINE  Never done   HEMOGLOBIN A1C  04/23/2022   Diabetic kidney evaluation - GFR measurement  11/07/2022   Hepatitis C Screening  Completed   HIV Screening  Completed   HPV VACCINES  Aged Out   PAP SMEAR-Modifier  Discontinued    Physical Exam: Vitals:   11/22/21 1328  BP: 120/80  Pulse: 92  Resp: 18  Temp: (!) 97 F (36.1 C)  SpO2: 97%  Height: _0  (1.575 m)   Body mass index is 33.91 kg/m. Physical Exam Constitutional:      General: She is not in acute distress.    Appearance: She is obese.  HENT:     Head: Normocephalic and atraumatic.     Nose: Nose normal.     Mouth/Throat:     Mouth: Mucous membranes are moist.  Eyes:     Conjunctiva/sclera: Conjunctivae normal.  Cardiovascular:     Rate and Rhythm: Normal rate and regular rhythm.  Pulmonary:     Effort: Pulmonary effort is normal.     Breath sounds: Normal breath sounds.  Abdominal:     General: Bowel sounds are normal.     Palpations: Abdomen is soft.  Musculoskeletal:        General: Normal range of motion.     Cervical back: Normal range of motion.  Skin:    General: Skin  is warm and dry.  Neurological:     Mental Status: She is alert and oriented to person, place, and time. Mental status is at baseline.     Motor: Weakness present.     Comments: Left-sided weakness. Dysarthric.  Psychiatric:        Mood and Affect: Mood normal.        Behavior: Behavior normal.        Thought Content: Thought content normal.        Judgment: Judgment normal.     Labs reviewed: Basic Metabolic Panel: Recent Labs    02/21/21 0407 02/22/21 0306 06/11/21 1743 06/21/21 1522 10/23/21 1137 11/02/21 0146 11/02/21 0246 11/02/21 2329 11/03/21 0811 11/04/21 0317 11/05/21 0259 11/06/21 0307  NA 134*   < >  --    < > 138   < >  --    < >  --  137 138 139  K 3.5   < >  --    < > 4.4   < >  --    < >  --  3.9 3.3* 3.5  CL 99   < >  --    < > 106   < >  --    < >  --  112* 112* 112*  CO2 27   < >  --    < > 19*   < >  --    < >  --  18* 20* 21*  GLUCOSE 215*   < >  --    < > 149*   < >  --    < >  --  233* 113* 93  BUN 7   < >  --    < > 17   < >  --    < >  --  _1 CREATININE 0.59   < >  --    < > 0.81   < >  --    < >  --  0.64 0.69 0.55  CALCIUM 8.6*   < >  --    < >  9.4   < >  --    < >  --  8.0* 8.3* 8.4*  MG 1.7   < >  --    < >  --   --   --    < > 1.7 2.2 2.1  --   PHOS 4.3  --   --   --   --   --   --   --   --   --   --   --   TSH  --   --  1.072  --  0.93  --  1.399  --   --   --   --   --    < > = values in this interval not displayed.   Liver Function Tests: Recent Labs    11/02/21 0146 11/02/21 2329 11/03/21 0451  AST 12* 11* 15  ALT _0 ALKPHOS 77 77 77  BILITOT 0.5 1.0 1.0  PROT 6.9 7.5 7.2  ALBUMIN 3.3* 3.6 3.4*   Recent Labs    06/11/21 1500 07/10/21 1233 07/11/21 1230  LIPASE 21 110* 42   Recent Labs    08/29/21 1420  AMMONIA 25   CBC: Recent Labs    11/04/21 0317 11/05/21 0259 11/06/21 0307  WBC 16.8* 11.7* 8.6  NEUTROABS 15.0* 7.9* 5.0  HGB 11.8* 12.5 13.0  HCT 37.9 38.3 39.1  MCV 82.0 78.6* 77.4*  PLT  292 318 287   Lipid Panel: Recent Labs    01/20/21 0709 04/24/21 1037 10/23/21 1137  CHOL 165 183 142  HDL 54 49* 51  LDLCALC 95 108* 70  TRIG 81 143 127  CHOLHDL 3.1 3.7 2.8   Lab Results  Component Value Date   HGBA1C 10.5 (H) 10/23/2021    Procedures since last visit: DG CHEST PORT 1 VIEW  Result Date: 11/03/2021 CLINICAL DATA:  Aspiration into airway EXAM: PORTABLE CHEST 1 VIEW COMPARISON:  11/02/2021 FINDINGS: Normal heart size and mediastinal contours. Prior median sternotomy. Low volume chest. No acute infiltrate or edema. No effusion or pneumothorax. No acute osseous findings. IMPRESSION: No evidence of active disease. Electronically Signed   By: Jorje Guild M.D.   On: 11/03/2021 06:28   CT HEAD WO CONTRAST (5MM)  Result Date: 11/03/2021 CLINICAL DATA:  Transient ischemic attack.  History of stroke. EXAM: CT HEAD WITHOUT CONTRAST TECHNIQUE: Contiguous axial images were obtained from the base of the skull through the vertex without intravenous contrast. RADIATION DOSE REDUCTION: This exam was performed according to the departmental dose-optimization program which includes automated exposure control, adjustment of the mA and/or kV according to patient size and/or use of iterative reconstruction technique. COMPARISON:  08/29/2021. FINDINGS: Brain: No acute intracranial hemorrhage, midline shift or mass effect. No extra-axial fluid collection. Diffuse atrophy is noted. Periventricular white matter hypodensities are noted bilaterally. There are stable hypodensities and encephalomalacia in the cerebellar hemispheres, left temporal lobe, basal ganglia bilaterally, and left frontal lobe. No hydrocephalus. Vascular: Atherosclerotic calcification of the carotid siphons and vertebral arteries. No hyperdense vessel. Skull: Normal. Negative for fracture or focal lesion. Sinuses/Orbits: No acute finding. Other: None. IMPRESSION: 1. No acute intracranial process. 2. Atrophy with chronic  microvascular ischemic changes and old infarcts. Electronically Signed   By: Brett Fairy M.D.   On: 11/03/2021 03:44   DG Abd 1 View  Result Date: 11/02/2021 CLINICAL DATA:  Vomiting. EXAM: ABDOMEN - 1 VIEW COMPARISON:  08/29/2021. FINDINGS: The bowel gas pattern is normal. Surgical clips are  present in the right upper quadrant. No radio-opaque calculi. Vascular calcifications are noted. IMPRESSION: No evidence of bowel obstruction. Electronically Signed   By: Brett Fairy M.D.   On: 11/02/2021 22:45   CT Chest Wo Contrast  Result Date: 11/02/2021 CLINICAL DATA:  51 year old female with shortness of breath and increasing cough. Recent treatment for pneumonia. Negative for COVID-19. EXAM: CT CHEST WITHOUT CONTRAST TECHNIQUE: Multidetector CT imaging of the chest was performed following the standard protocol without IV contrast. RADIATION DOSE REDUCTION: This exam was performed according to the departmental dose-optimization program which includes automated exposure control, adjustment of the mA and/or kV according to patient size and/or use of iterative reconstruction technique. COMPARISON:  Chest radiographs 0222 hours today and earlier. CT Abdomen and Pelvis 07/10/2021. FINDINGS: Cardiovascular: Repeat series 6 images are relatively motion free. 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 Extensive Calcified aortic atherosclerosis. Vascular patency is not evaluated in the absence of IV contrast. Aberrant origin of the right subclavian artery, normal variant. No cardiomegaly or pericardial effusion. Calcified atherosclerosis of the visible cervical carotid arteries. Mediastinum/Nodes: Negative noncontrast appearance of the mediastinum. Lungs/Pleura: Respiratory motion despite repeated imaging attempts. Major airways remain patent. There is mild platelike peribronchial opacity in both lower lobes. Additional dependent opacity in the posterior basal segment of the left lower lobe. Azygous lobe and fissure,  normal variant. No pleural effusion no other abnormal pulmonary opacity. Upper Abdomen: Negative visible noncontrast liver, spleen, stomach. Musculoskeletal: Prior sternotomy. Osteopenia. No acute osseous abnormality identified. IMPRESSION: 1. Mild motion artifact. Mild bilateral lower lobe pulmonary opacity more resembles atelectasis than infection. No pleural effusion. 2. Aortic Atherosclerosis (ICD10-I70.0). Prior sternotomy, possibly CABG. Electronically Signed   By: Genevie Ann M.D.   On: 11/02/2021 07:48   DG Chest Port 1 View  Result Date: 11/02/2021 CLINICAL DATA:  Dyspnea EXAM: PORTABLE CHEST 1 VIEW COMPARISON:  Radiographs 10/16/2021 FINDINGS: Low lung volumes. No focal consolidation, pleural effusion, or pneumothorax. Normal cardiomediastinal silhouette. No acute osseous abnormality. Azygous fissure. Sternotomy and mediastinal clips. IMPRESSION: No active disease. Electronically Signed   By: Placido Sou M.D.   On: 11/02/2021 02:36    Assessment/Plan  1. Arthralgia, unspecified joint -  will start on Prednisone 20 mg BID X 3 days then 20 mg dfaily X 4 days - predniSONE (DELTASONE) 20 MG tablet; Take 1 tablet (20 mg total) by mouth daily with breakfast for 4 days.  Dispense: 4 tablet; Refill: 0 - predniSONE (DELTASONE) 20 MG tablet; Take 1 tablet (20 mg total) by mouth 2 (two) times daily with a meal for 3 days.  Dispense: 6 tablet; Refill: 0  2. Muscle spasm -  will start on Flexeril - cyclobenzaprine (FLEXERIL) 5 MG tablet; Take 1 tablet (5 mg total) by mouth 2 (two) times daily as needed for up to 7 days for muscle spasms.  Dispense: 14 tablet; Refill: 0  3. Gastroesophageal reflux disease without esophagitis - pantoprazole (PROTONIX) 40 MG tablet; Take 1 tablet (40 mg total) by mouth every morning.  Dispense: 90 tablet; Refill: 2  4. Dyspnea, unspecified type -  ACE inhibitor was discontinued -  continue Pulmicort, Atrovent and Xopenex nebs  5. Urinary tract infection without  hematuria, site unspecified -  completed Rocephin and Diflucan  6. Essential hypertension -  BP 120/80 -  continue Amlodipine and Metoprolol tartrate  7. Type 2 diabetes mellitus with hyperlipidemia ([facility]) Lab Results  Component Value Date   HGBA1C 10.5 (H) 10/23/2021   -  continue Lantus and Novolog -  check CG daily and log   Labs/tests ordered:   None  Next appt:  Visit date not found

## 2021-11-22 NOTE — Telephone Encounter (Signed)
Needs appt for botox. Done last in May.

## 2021-11-25 NOTE — Progress Notes (Signed)
This encounter was created in error - please disregard.

## 2021-11-27 ENCOUNTER — Telehealth: Payer: Self-pay

## 2021-11-27 NOTE — Telephone Encounter (Signed)
If having any symptoms like any headache,dizziness,vision changes,fatigue,chest tightness,palpitation,chest pain or shortness of breath need to be evaluated in the urgent care or ED. If no symptoms please make follow up appointment at the office to evaluate blood pressure.

## 2021-11-27 NOTE — Telephone Encounter (Signed)
[facility] called to let us know that patient is having high blood pressure in the 200's.

## 2021-11-28 ENCOUNTER — Other Ambulatory Visit: Payer: Self-pay | Admitting: Adult Health

## 2021-11-28 DIAGNOSIS — M62838 Other muscle spasm: Secondary | ICD-10-CM

## 2021-11-28 NOTE — Telephone Encounter (Signed)
Pharmacy requested refill.  Refilled 6 days ago with #[phone]

## 2021-12-03 NOTE — Therapy (Unsigned)
OUTPATIENT SPEECH LANGUAGE PATHOLOGY EVALUATION   Patient Name: Lori [name] MRN: [id number] DOB:1970-06-09, 51 y.o., female Today's Date: 12/03/2021  PCP: *** REFERRING PROVIDER: Ngetich, Nelda Bucks, NP    Past Medical History:  Diagnosis Date   CAD (coronary artery disease)    Depression    Diabetes mellitus without complication ([place])    History of CT scan    History of mammogram    History of MRI    Hypertension    Pheochromocytoma    Pheochromocytoma    Stroke [facility])    Thyroid disease    Past Surgical History:  Procedure Laterality Date   ABDOMINAL HYSTERECTOMY     CESAREAN SECTION     3   CORONARY ARTERY BYPASS GRAFT     Right adrenal gland removal for pheochromocytoma Right    Patient Active Problem List   Diagnosis Date Noted   Dyspnea 11/02/2021   Cough 11/02/2021   GERD (gastroesophageal reflux disease) 11/02/2021   Acute bronchitis 08/24/2021   Pseudohyponatremia 07/10/2021   Thyroid disease 07/10/2021   Functional urinary incontinence 05/17/2021   MDD (major depressive disorder), recurrent episode, severe ([place]) 04/09/2021   Diabetic gastroparesis ([place]) 03/16/2021   Migraines 03/16/2021   History of cerebral infarction    Debility 02/26/2021   DKA (diabetic ketoacidosis) ([place]) 02/19/2021   Constipation    Weakness    Type 2 diabetes mellitus with hyperlipidemia ([place])    Aphasia    Spell of abnormal behavior    Stroke ([place]) 01/19/2021   Tachycardia 01/19/2021   CAD (coronary artery disease) 01/19/2021   CVA (cerebral vascular accident) ([place]) 01/19/2021   Diabetes ([place])    Hyperthyroidism    Essential hypertension    Pheochromocytoma    Anxiety and depression    HLD (hyperlipidemia)     ONSET DATE: 11/02/2021   REFERRING DIAG: R47.1 (ICD-10-CM) - Dysarthria  THERAPY DIAG:  No diagnosis found.  Rationale for Evaluation and Treatment Rehabilitation  SUBJECTIVE:   SUBJECTIVE STATEMENT: *** Pt accompanied by:  {accompnied:27141}  PERTINENT HISTORY: Pt with medical history significant of DM2, CVA w/ left side hemiparesis, CAD, anxiety, HTN, HLD, GERD.  Pt was seen for SLP swallow evaluation - rec regular/thin diet.  PAIN:  Are you having pain? {OPRCPAIN:27236}  FALLS: Has patient fallen in last 6 months?  {ANVBTYOM:60045}  LIVING ENVIRONMENT: Lives with: {OPRC lives with:25569::"lives with their family"} Lives in: {Lives in:25570}  PLOF:  Level of assistance: {TXHFSFS:23953} Employment: {SLPemployment:25674}  PATIENT GOALS: ***  OBJECTIVE:   DIAGNOSTIC FINDINGS: ***  COGNITION: Overall cognitive status: {cognition:24006} Areas of impairment:  {cognitiveimpairmentslp:27409} Functional deficits: ***  MOTOR SPEECH: Overall motor speech: {slpimpaired:27210} Level of impairment: {SLP level of impairment:25441} Rate of Speech: Dysfluencies: Articulation: {SLParticulation:27218} Resonance: {SLP resonance:25440} Respiration: {respbreathing:27195} Phonation: {SLP phonation:25439} Diadochokinetic rate: Emotional Expression: Intelligibility: {SLP Intelligible:25442} Motor planning: {slpmotorspeecherrors:27220} Motor speech errors: {SLP motor speech errors:25443} Interfering components: {SLP Interfering components (MS):25444} Effective technique: {SLP effective technique (MS):25445} Voice quality:  Vocal fatigue: Contributing factors: Comments: ***  ORAL MOTOR EXAMINATION: Overall status: {OMESLP2:27645} Comments: ***  RECOMMENDATIONS FROM OBJECTIVE SWALLOW STUDY (MBSS/FEES):  *** Objective swallow impairments: *** Objective recommended compensations: ***  CLINICAL SWALLOW ASSESSMENT:   Current diet: {slpdiet:27196} Dentition: {dentition:27197} Patient directly observed with POs: {POobserved:27199} Feeding: {slp feeding:27200} Liquids provided by: {SLPliquids:27201} Oral phase signs and symptoms: {SLPoralphase:27202} Pharyngeal phase signs and symptoms:  {SLPpharyngealphase:27203} Comments: ***  STANDARDIZED ASSESSMENTS: {SLPstandardizedassessment:27092}  PATIENT REPORTED OUTCOME MEASURES (PROM): {SLPPROM:27095}   TODAY'S TREATMENT: 12-04-21: ***   PATIENT EDUCATION:  Education details: *** Person educated: {Person educated:25204} Education method: {Education Method:25205} Education comprehension: {Education Comprehension:25206}   GOALS: Goals reviewed with patient? {yes/no:20286}  SHORT TERM GOALS: Target date: {follow up:25551}  (Remove Blue Hyperlink)  *** Baseline: Goal status: {GOALSTATUS:25110}  2.  *** Baseline:  Goal status: {GOALSTATUS:25110}  3.  *** Baseline:  Goal status: {GOALSTATUS:25110}  4.  *** Baseline:  Goal status: {GOALSTATUS:25110}  5.  *** Baseline:  Goal status: {GOALSTATUS:25110}  6.  *** Baseline:  Goal status: {GOALSTATUS:25110}  LONG TERM GOALS: Target date: {follow up:25551}  (Remove Blue Hyperlink)  *** Baseline:  Goal status: {GOALSTATUS:25110}  2.  *** Baseline:  Goal status: {GOALSTATUS:25110}  3.  *** Baseline:  Goal status: {GOALSTATUS:25110}  4.  *** Baseline:  Goal status: {GOALSTATUS:25110}  5.  *** Baseline:  Goal status: {GOALSTATUS:25110}  6.  *** Baseline:  Goal status: {GOALSTATUS:25110}  ASSESSMENT:  CLINICAL IMPRESSION: Patient is a *** y.o. *** who was seen today for ***.   OBJECTIVE IMPAIRMENTS: include {SLPOBJIMP:27107}. These impairments are limiting patient from {SLPLIMIT:27108}. Factors affecting potential to achieve goals and functional outcome are {SLP factors:25450}. Patient will benefit from skilled SLP services to address above impairments and improve overall function.  REHAB POTENTIAL: {rehabpotential:25112}  PLAN:  SLP FREQUENCY: {rehab frequency:25116}  SLP DURATION: {rehab duration:25117}  PLANNED INTERVENTIONS: {SLP treatment/interventions:25449}    Su Monks, CCC-SLP 12/03/2021, 11:22 AM

## 2021-12-04 ENCOUNTER — Ambulatory Visit: Payer: Medicaid Other | Admitting: Speech Pathology

## 2021-12-04 DIAGNOSIS — R471 Dysarthria and anarthria: Secondary | ICD-10-CM

## 2021-12-04 NOTE — Therapy (Signed)
[facility] [address] [place], [place], [address] Phone: [phone]   Fax:  [phone]  Patient Details  Name: Lori [name] MRN: [id number] Date of Birth: 04/29/1970 Referring Provider:  [name], NP  Encounter Date: 12/04/2021  Pt arrived for evaluation with husband, reports to seeing Sobieski. SLP advises pt to complete [place] and seek new referral for this clinic if dysarthria not resolved. Pt and spouse verbalize understanding.   Su Monks, [place] 12/04/2021, 3:59 PM  [facility] [address] [place], [place], [address] Phone: [phone]   Fax:  [phone]

## 2021-12-10 ENCOUNTER — Telehealth (INDEPENDENT_AMBULATORY_CARE_PROVIDER_SITE_OTHER): Payer: Medicaid Other | Admitting: Family

## 2021-12-10 ENCOUNTER — Encounter: Payer: Self-pay | Admitting: Family

## 2021-12-10 VITALS — BP 134/98 | HR 114 | Temp 97.0°F | Ht 62.0 in | Wt 185.0 lb

## 2021-12-10 DIAGNOSIS — R000 Tachycardia, unspecified: Secondary | ICD-10-CM

## 2021-12-10 DIAGNOSIS — M62838 Other muscle spasm: Secondary | ICD-10-CM

## 2021-12-10 MED ORDER — CYCLOBENZAPRINE HCL 5 MG PO TABS: ORAL_TABLET | ORAL | 3 refills | Status: DC

## 2021-12-10 NOTE — Progress Notes (Signed)
[name], ulloa are scheduled for a virtual visit with your provider today.    Just as we do with appointments in the office, we must obtain your consent to participate.  Your consent will be active for this visit and any virtual visit you may have with one of our providers in the next 365 days.    If you have a MyChart account, I can also send a copy of this consent to you electronically.  All virtual visits are billed to your insurance company just like a traditional visit in the office.  As this is a virtual visit, video technology does not allow for your provider to perform a traditional examination.  This may limit your provider's ability to fully assess your condition.  If your provider identifies any concerns that need to be evaluated in person or the need to arrange testing such as labs, EKG, etc, we will make arrangements to do so.    Although advances in technology are sophisticated, we cannot ensure that it will always work on either your end or our end.  If the connection with a video visit is poor, we may have to switch to a telephone visit.  With either a video or telephone visit, we are not always able to ensure that we have a secure connection.   I need to obtain your verbal consent now.   Are you willing to proceed with your visit today?   [employer] has provided verbal consent on 12/10/2021 for a virtual visit (video or telephone). This service is provided via telemedicine  vital signs collected/recorded through patient although visit is telemedicine visit.   Location of patient (ex: home, work):  home  Patient consents to a telephone visit:  yes  Location of the provider (ex: office, home):  [facility]  Names of all persons participating in the telemedicine service and their role in the encounter:  Melissa Roney Jaffe, Avian [place], Nelda Bucks, NP, and patient  Time spent on call:  10 minutes    Debe Coder, [facility] 12/10/2021   3:48 PM    Provider: Webb Silversmith Cray Monnin FNP-C  Rhema Boyett, Nelda Bucks, NP  Patient Care Team: Jeanny Rymer, Nelda Bucks, NP as PCP - General (Family Medicine) Early Osmond, MD as PCP - Cardiology (Cardiology)  Extended Emergency Contact Information Primary Emergency Contact: [name],[name] Mobile Phone: [phone] Relation: Spouse Preferred language: English Interpreter needed? No Secondary Emergency Contact: Downing Phone: [phone] Mobile Phone: [phone] Relation: Daughter Mother: Gayla Doss Phone: [phone]  Code Status:  Full Code  Goals of care: Advanced Directive information    12/04/2021    3:43 PM  Advanced Directives  Does Patient Have a Medical Advance Directive? No  Would patient like information on creating a medical advance directive? No - Patient declined     Chief Complaint  Patient presents with   Acute Visit    Patient is here for rapid pulse and hand and arm pain scale of 5 still having muscle spasms Needs refill for flexeril.    HPI:  Pt is a 51 y.o. female seen today for an acute visit for evaluation of rapid pulse ,hand and arm pain rating 5/10 on scale.Has muscle spasm request refill on Flexeril.  States Home health Nurse came to visit and HR was in the 120's.HR checked by Physical therapist present during visit was in the 130's.she denies any headache,dizziness,vision changes,fatigue,chest tightness,palpitation,chest pain or shortness of breath. States took her metoprolol today.Not sure if taking  her Methimazole daily.    Past Medical History:  Diagnosis Date   CAD (coronary artery disease)    Depression    Diabetes mellitus without complication ([place])    History of CT scan    History of mammogram    History of MRI    Hypertension    Pheochromocytoma    Pheochromocytoma    Stroke ([place])    Thyroid disease    Past Surgical History:  Procedure Laterality Date   ABDOMINAL HYSTERECTOMY     CESAREAN SECTION     3    CORONARY ARTERY BYPASS GRAFT     Right adrenal gland removal for pheochromocytoma Right     Allergies  Allergen Reactions   Contrast Media [Iodinated Contrast Media] Anaphylaxis and Swelling   Sumatriptan Anaphylaxis   Morphine Swelling   Penicillins Swelling    Mouth swells up and eyes swollen shut    Outpatient Encounter Medications as of 12/10/2021  Medication Sig   albuterol (PROVENTIL) (2.5 MG/3ML) 0.083% nebulizer solution Take 3 mLs (2.5 mg total) by nebulization every 6 (six) hours as needed for wheezing or shortness of breath.   atorvastatin (LIPITOR) 80 MG tablet Take 80 mg by mouth daily.   blood glucose meter kit and supplies KIT Dispense based on patient and insurance preference. Use up to four times daily as directed.   budesonide (PULMICORT) 0.25 MG/2ML nebulizer solution Take 2 mLs (0.25 mg total) by nebulization 2 (two) times daily.   Continuous Blood Gluc Receiver (DEXCOM G5 RECEIVER KIT) DEVI 1 Device by Does not apply route 4 (four) times daily.   cyclobenzaprine (FLEXERIL) 5 MG tablet TAKE 1 TABLET(5 MG) BY MOUTH TWICE DAILY FOR UP TO 7 DAYS AS NEEDED FOR MUSCLE SPASMS   diclofenac Sodium (VOLTAREN) 1 % GEL Apply 4 g topically 4 (four) times daily.   diphenhydrAMINE (BENADRYL) 25 MG tablet Take 25 mg by mouth at bedtime.   escitalopram (LEXAPRO) 20 MG tablet TAKE 1 TABLET BY MOUTH EVERY MORNING   ezetimibe (ZETIA) 10 MG tablet Take 1 tablet (10 mg total) by mouth daily.   gabapentin (NEURONTIN) 100 MG capsule TAKE 1 CAPSULE BY MOUTH THREE TIMES DAILY (BREAKFAST, LUNCH, BEDTIME)   hydrOXYzine (ATARAX) 25 MG tablet Take 1 tablet (25 mg total) by mouth at bedtime as needed for anxiety.   insulin aspart (NOVOLOG) 100 UNIT/ML FlexPen Inject 7 Units into the skin 3 (three) times daily with meals.   insulin glargine (LANTUS) 100 UNIT/ML Solostar Pen Inject 38 Units into the skin at bedtime.   Insulin Pen Needle 32G X 4 MM MISC Use to inject insulin 4 times daily as directed.    ipratropium-albuterol (DUONEB) 0.5-2.5 (3) MG/3ML SOLN Take 3 mLs by nebulization 2 (two) times daily.   LORazepam (ATIVAN) 0.5 MG tablet TAKE 1 TABLET BY MOUTH AT BEDTIME   MELATONIN GUMMIES PO Take 9 mg by mouth at bedtime as needed.   methimazole (TAPAZOLE) 5 MG tablet TAKE 1 TABLET BY MOUTH EVERY MORNING   metoprolol tartrate (LOPRESSOR) 100 MG tablet TAKE 1 TABLET BY MOUTH TWICE DAILY (BREAKFAST, BEDTIME)   nystatin cream (MYCOSTATIN) Apply 1 Application topically 2 (two) times daily. Affected areas on breast fold and groin and sacral areas   pantoprazole (PROTONIX) 40 MG tablet Take 1 tablet (40 mg total) by mouth every morning.   senna (SENOKOT) 8.6 MG tablet Take 8.6 mg by mouth as needed for constipation.   tamsulosin (FLOMAX) 0.4 MG CAPS capsule TAKE 1 CAPSULE BY MOUTH  AT BEDTIME   topiramate (TOPAMAX) 25 MG tablet TAKE 1 TABLET(25 MG) BY MOUTH TWICE DAILY   zinc oxide (BALMEX) 11.3 % CREA cream Apply 1 Application topically 2 (two) times daily.   amLODipine (NORVASC) 10 MG tablet Take 1 tablet (10 mg total) by mouth daily.   metoCLOPramide (REGLAN) 10 MG/10ML SOLN Take 5 mLs (5 mg total) by mouth 4 (four) times daily -  before meals and at bedtime for 14 days.   No facility-administered encounter medications on file as of 12/10/2021.    Review of Systems  Constitutional:  Negative for appetite change, chills, fatigue, fever and unexpected weight change.  HENT:  Negative for congestion, rhinorrhea, sinus pressure, sinus pain, sneezing and sore throat.   Respiratory:  Negative for cough, chest tightness, shortness of breath and wheezing.   Cardiovascular:  Negative for chest pain, palpitations and leg swelling.       Tachycardia   Gastrointestinal:  Negative for abdominal distention, abdominal pain, constipation, diarrhea, nausea and vomiting.  Endocrine: Negative for cold intolerance, heat intolerance, polydipsia, polyphagia and polyuria.  Musculoskeletal:  Positive for gait  problem. Negative for arthralgias and back pain.  Skin:  Negative for color change, pallor and rash.  Neurological:  Negative for dizziness, weakness, light-headedness and headaches.       Left hand contracture and spasm      There is no immunization history on file for this patient. Pertinent  Health Maintenance Due  Topic Date Due   FOOT EXAM  Never done   OPHTHALMOLOGY EXAM  Never done   COLONOSCOPY (Pts 45-30yr Insurance coverage will need to be confirmed)  Never done   INFLUENZA VACCINE  Never done   HEMOGLOBIN A1C  04/23/2022   PAP SMEAR-Modifier  Discontinued      11/06/2021    8:00 AM 11/07/2021   12:00 AM 11/07/2021    8:50 AM 11/07/2021   11:00 PM 11/22/2021    1:36 PM  Fall Risk  Falls in the past year?     1  Was there an injury with Fall?     1  Fall Risk Category Calculator     3  Fall Risk Category     High  Patient Fall Risk Level Low fall risk Low fall risk Low fall risk Low fall risk High fall risk  Patient at Risk for Falls Due to     History of fall(s);Impaired balance/gait;Impaired mobility  Fall risk Follow up     Falls evaluation completed;Education provided;Falls prevention discussed   Functional Status Survey:    Vitals:   12/10/21 1514  BP: (!) 134/98  Pulse: (!) 114  Temp: (!) 97 F (36.1 C)  Weight: 185 lb (83.9 kg)  Height: _0  (1.575 m)   Body mass index is 33.84 kg/m. Physical Exam Musculoskeletal:     Comments: Left hand contracture   Neurological:     Mental Status: She is alert and oriented to person, place, and time.     Gait: Gait abnormal.  Psychiatric:        Mood and Affect: Mood normal.        Behavior: Behavior normal.     Labs reviewed: Recent Labs    02/21/21 0407 02/22/21 0306 11/03/21 0811 11/04/21 0317 11/05/21 0259 11/06/21 0307  NA 134*   < >  --  137 138 139  K 3.5   < >  --  3.9 3.3* 3.5  CL 99   < >  --  112* 112* 112*  CO2 27   < >  --  18* 20* 21*  GLUCOSE 215*   < >  --  233* 113* 93  BUN 7    < >  --  _0 CREATININE 0.59   < >  --  0.64 0.69 0.55  CALCIUM 8.6*   < >  --  8.0* 8.3* 8.4*  MG 1.7   < > 1.7 2.2 2.1  --   PHOS 4.3  --   --   --   --   --    < > = values in this interval not displayed.   Recent Labs    11/02/21 0146 11/02/21 2329 11/03/21 0451  AST 12* 11* 15  ALT _1 ALKPHOS 77 77 77  BILITOT 0.5 1.0 1.0  PROT 6.9 7.5 7.2  ALBUMIN 3.3* 3.6 3.4*   Recent Labs    11/04/21 0317 11/05/21 0259 11/06/21 0307  WBC 16.8* 11.7* 8.6  NEUTROABS 15.0* 7.9* 5.0  HGB 11.8* 12.5 13.0  HCT 37.9 38.3 39.1  MCV 82.0 78.6* 77.4*  PLT 292 318 287   Lab Results  Component Value Date   TSH 1.399 11/02/2021   Lab Results  Component Value Date   HGBA1C 10.5 (H) 10/23/2021   Lab Results  Component Value Date   CHOL 142 10/23/2021   HDL 51 10/23/2021   LDLCALC 70 10/23/2021   TRIG 127 10/23/2021   CHOLHDL 2.8 10/23/2021    Significant Diagnostic Results in last 30 days:  No results found.  Assessment/Plan  1. Tachycardia HR checked by Home health Nurse and Physical Therapy during visit ranging in the 120's -130's.Asymptomatic. Took her metoprolol 100 mg tablet  Unclear if she has been taking Methimazole. - will obtain portable EKG due to immobility issues.Order written and faxed by Merrilyn Puma to [place].Patient notified.  - advised to go ED if Symptoms worsen  Will obtain TSH level once she confirms if tking her methimazole   2. Muscle spasm Continue on Flexeril .script send to pharmacy.  - cyclobenzaprine (FLEXERIL) 5 MG tablet; TAKE 1 TABLET(5 MG) BY MOUTH TWICE DAILY FOR UP TO 7 DAYS AS NEEDED FOR MUSCLE SPASMS  Dispense: 30 tablet; Refill: 3   Family/ staff Communication: Reviewed plan of care with patient verbalized understanding   Labs/tests ordered: None   Next Appointment: Return if symptoms worsen or fail to improve.  I connected with  [name] on 12/10/21 by a video enabled  telemedicine application and verified that I am speaking with the correct person using two identifiers.   I discussed the limitations of evaluation and management by telemedicine. The patient expressed understanding and agreed to proceed.   Spent 20 minutes of face to face with patient  >50% time spent counseling; reviewing medical record; labs; and developing future plan of care.  [name], NP

## 2021-12-13 ENCOUNTER — Telehealth: Payer: Self-pay | Admitting: *Deleted

## 2021-12-13 ENCOUNTER — Telehealth: Payer: Self-pay | Admitting: Family

## 2021-12-13 NOTE — Telephone Encounter (Signed)
Liza with [facility] called requesting verbal orders to extend ST 1X5weeks.   Verbal orders given.

## 2021-12-13 NOTE — Telephone Encounter (Signed)
Called patient and no answer. Voicemail was left with office call back number.   

## 2021-12-13 NOTE — Telephone Encounter (Signed)
Portable EKG fax result received indicates Sinus Rhythm HR 96 b/min Bard[place] prolong QT recommend referral to Cardiologist for further evaluation.

## 2021-12-18 ENCOUNTER — Telehealth: Payer: Self-pay

## 2021-12-18 NOTE — Telephone Encounter (Signed)
Did patient take her medication ? Start on clonidine 0.1 mg tablet one by mouth once daily if SB/p > 160

## 2021-12-18 NOTE — Telephone Encounter (Signed)
Beth w/ [facility] called to report to Webb Silversmith Ngetich,NP she had a visit on yesterday,12/17/21 with patient and her BP was 200/120 so Beth called 911 and EMS came out and pt's bp went down to 168/88. Beth states that patient declined to be taking to hospital and states that she had to call pharmacy about her medications for blood pressure. Eustaquio Maize can be reached at [phone] Just an [place]

## 2021-12-18 NOTE — Telephone Encounter (Signed)
MyChart message sent to patient.

## 2021-12-20 ENCOUNTER — Ambulatory Visit (INDEPENDENT_AMBULATORY_CARE_PROVIDER_SITE_OTHER): Payer: Medicaid Other | Admitting: Family

## 2021-12-20 ENCOUNTER — Ambulatory Visit
Admission: RE | Admit: 2021-12-20 | Discharge: 2021-12-20 | Disposition: A | Discharge status: Home / Self Care | Payer: Medicaid Other | Source: Ambulatory Visit | Attending: Family | Admitting: Family

## 2021-12-20 ENCOUNTER — Encounter: Payer: Self-pay | Admitting: Family

## 2021-12-20 VITALS — BP 150/100 | HR 115 | Temp 97.9°F | Resp 18 | Ht 62.0 in | Wt 189.8 lb

## 2021-12-20 DIAGNOSIS — M79642 Pain in left hand: Secondary | ICD-10-CM

## 2021-12-20 DIAGNOSIS — I10 Essential (primary) hypertension: Secondary | ICD-10-CM | POA: Diagnosis not present

## 2021-12-20 DIAGNOSIS — M7989 Other specified soft tissue disorders: Secondary | ICD-10-CM | POA: Diagnosis not present

## 2021-12-20 DIAGNOSIS — I639 Cerebral infarction, unspecified: Secondary | ICD-10-CM

## 2021-12-20 MED ORDER — HYDROCHLOROTHIAZIDE 12.5 MG PO TABS: 12.5000 mg | ORAL_TABLET | Freq: Every day | ORAL | 3 refills | Status: DC

## 2021-12-20 NOTE — Patient Instructions (Addendum)
-  apply ice pack compressor to left hand for 10 - 20 minutes daily for swelling  - take tylenol 500 mg tablet one by mouth every 8 hrs as needed for pain  - - Please get left hand X-ray at [place] [facility] then will call you with results.

## 2021-12-20 NOTE — Progress Notes (Signed)
Provider: Marlowe Sax FNP-C  Jonte Shiller, Nelda Bucks, NP  Patient Care Team: Uriyah Massimo, Nelda Bucks, NP as PCP - General (Family Medicine) Early Osmond, MD as PCP - Cardiology (Cardiology)  Extended Emergency Contact Information Primary Emergency Contact: [name],[name] Mobile Phone: [phone] Relation: Spouse Preferred language: English Interpreter needed? No Secondary Emergency Contact: [place] Phone: [phone] Mobile Phone: [phone] Relation: Daughter Mother: Gayla Doss Phone: [phone]  Code Status:  Full Code  Goals of care: Advanced Directive information    12/20/2021    1:47 PM  Advanced Directives  Does Patient Have a Medical Advance Directive? No  Would patient like information on creating a medical advance directive? No - Patient declined     Chief Complaint  Patient presents with   Acute Visit    Patient complains of right hand spasms. Patient states that it's painful and she's having trouble opening/closing hand.    HPI:  Pt is a 51 y.o. female seen today for an acute visit for evaluation of left hand pain and spasm.unable to straighten.States fingers were straight at first then closed.denies any injuries to hand.Has hx of CVA with left side weakness.hands has been swollen. He denies any fever or chills. Has been exercising with Home health Physical Therapy.  Past Medical History:  Diagnosis Date   CAD (coronary artery disease)    Depression    Diabetes mellitus without complication ([place])    History of CT scan    History of mammogram    History of MRI    Hypertension    Pheochromocytoma    Pheochromocytoma    Stroke ([place])    Thyroid disease    Past Surgical History:  Procedure Laterality Date   ABDOMINAL HYSTERECTOMY     CESAREAN SECTION     3   CORONARY ARTERY BYPASS GRAFT     Right adrenal gland removal for pheochromocytoma Right     Allergies  Allergen Reactions   Contrast Media [Iodinated Contrast  Media] Anaphylaxis and Swelling   Sumatriptan Anaphylaxis   Morphine Swelling   Penicillins Swelling    Mouth swells up and eyes swollen shut    Outpatient Encounter Medications as of 12/20/2021  Medication Sig   albuterol (PROVENTIL) (2.5 MG/3ML) 0.083% nebulizer solution Take 3 mLs (2.5 mg total) by nebulization every 6 (six) hours as needed for wheezing or shortness of breath.   amLODipine (NORVASC) 10 MG tablet Take 1 tablet (10 mg total) by mouth daily.   atorvastatin (LIPITOR) 80 MG tablet Take 80 mg by mouth daily.   blood glucose meter kit and supplies KIT Dispense based on patient and insurance preference. Use up to four times daily as directed.   budesonide (PULMICORT) 0.25 MG/2ML nebulizer solution Take 2 mLs (0.25 mg total) by nebulization 2 (two) times daily.   Continuous Blood Gluc Receiver (DEXCOM G5 RECEIVER KIT) DEVI 1 Device by Does not apply route 4 (four) times daily.   cyclobenzaprine (FLEXERIL) 5 MG tablet TAKE 1 TABLET(5 MG) BY MOUTH TWICE DAILY FOR UP TO 7 DAYS AS NEEDED FOR MUSCLE SPASMS   diclofenac Sodium (VOLTAREN) 1 % GEL Apply 4 g topically 4 (four) times daily.   diphenhydrAMINE (BENADRYL) 25 MG tablet Take 25 mg by mouth at bedtime.   escitalopram (LEXAPRO) 20 MG tablet TAKE 1 TABLET BY MOUTH EVERY MORNING   ezetimibe (ZETIA) 10 MG tablet Take 1 tablet (10 mg total) by mouth daily.   gabapentin (NEURONTIN) 100 MG capsule TAKE 1 CAPSULE BY MOUTH THREE TIMES DAILY (  BREAKFAST, LUNCH, BEDTIME)   hydrOXYzine (ATARAX) 25 MG tablet Take 1 tablet (25 mg total) by mouth at bedtime as needed for anxiety.   insulin aspart (NOVOLOG) 100 UNIT/ML FlexPen Inject 7 Units into the skin 3 (three) times daily with meals.   insulin glargine (LANTUS) 100 UNIT/ML Solostar Pen Inject 38 Units into the skin at bedtime.   Insulin Pen Needle 32G X 4 MM MISC Use to inject insulin 4 times daily as directed.   ipratropium-albuterol (DUONEB) 0.5-2.5 (3) MG/3ML SOLN Take 3 mLs by  nebulization 2 (two) times daily.   LORazepam (ATIVAN) 0.5 MG tablet TAKE 1 TABLET BY MOUTH AT BEDTIME   MELATONIN GUMMIES PO Take 9 mg by mouth at bedtime as needed.   methimazole (TAPAZOLE) 5 MG tablet TAKE 1 TABLET BY MOUTH EVERY MORNING   metoprolol tartrate (LOPRESSOR) 100 MG tablet TAKE 1 TABLET BY MOUTH TWICE DAILY (BREAKFAST, BEDTIME)   nystatin cream (MYCOSTATIN) Apply 1 Application topically 2 (two) times daily. Affected areas on breast fold and groin and sacral areas   pantoprazole (PROTONIX) 40 MG tablet Take 1 tablet (40 mg total) by mouth every morning.   senna (SENOKOT) 8.6 MG tablet Take 8.6 mg by mouth as needed for constipation.   tamsulosin (FLOMAX) 0.4 MG CAPS capsule TAKE 1 CAPSULE BY MOUTH AT BEDTIME   topiramate (TOPAMAX) 25 MG tablet TAKE 1 TABLET(25 MG) BY MOUTH TWICE DAILY   zinc oxide (BALMEX) 11.3 % CREA cream Apply 1 Application topically 2 (two) times daily.   metoCLOPramide (REGLAN) 10 MG/10ML SOLN Take 5 mLs (5 mg total) by mouth 4 (four) times daily -  before meals and at bedtime for 14 days.   No facility-administered encounter medications on file as of 12/20/2021.    Review of Systems  Constitutional:  Negative for appetite change, chills, fatigue, fever and unexpected weight change.  Eyes:  Negative for pain, discharge, redness, itching and visual disturbance.  Respiratory:  Negative for cough, chest tightness, shortness of breath and wheezing.   Cardiovascular:  Negative for chest pain, palpitations and leg swelling.  Musculoskeletal:  Negative for arthralgias, back pain, gait problem, joint swelling, myalgias, neck pain and neck stiffness.  Skin:  Negative for color change, pallor and rash.  Neurological:  Negative for dizziness, syncope, speech difficulty, light-headedness and headaches.       Left hand contracture spasm      There is no immunization history on file for this patient. Pertinent  Health Maintenance Due  Topic Date Due   FOOT EXAM   Never done   OPHTHALMOLOGY EXAM  Never done   COLONOSCOPY (Pts 45-77yr Insurance coverage will need to be confirmed)  Never done   INFLUENZA VACCINE  Never done   HEMOGLOBIN A1C  04/23/2022   PAP SMEAR-Modifier  Discontinued      11/07/2021   12:00 AM 11/07/2021    8:50 AM 11/07/2021   11:00 PM 11/22/2021    1:36 PM 12/20/2021    1:46 PM  Fall Risk  Falls in the past year?    1 1  Was there an injury with Fall?    1 1  Fall Risk Category Calculator    3 3  Fall Risk Category    High High  Patient Fall Risk Level Low fall risk Low fall risk Low fall risk High fall risk High fall risk  Patient at Risk for Falls Due to    History of fall(s);Impaired balance/gait;Impaired mobility History of fall(s);Impaired balance/gait;Impaired mobility  Fall risk Follow up    Falls evaluation completed;Education provided;Falls prevention discussed Falls evaluation completed;Education provided;Falls prevention discussed   Functional Status Survey:    Vitals:   12/20/21 1337  BP: (!) 150/100  Pulse: (!) 115  Resp: 18  Temp: 97.9 F (36.6 C)  SpO2: 93%  Weight: 189 lb 12.8 oz (86.1 kg)  Height: _0  (1.575 m)   Body mass index is 34.71 kg/m. Physical Exam Vitals reviewed.  Constitutional:      General: She is not in acute distress.    Appearance: Normal appearance. She is obese. She is not ill-appearing or diaphoretic.  Neck:     Vascular: No carotid bruit.  Cardiovascular:     Rate and Rhythm: Normal rate and regular rhythm.     Pulses: Normal pulses.     Heart sounds: Normal heart sounds. No murmur heard.    No friction rub. No gallop.  Pulmonary:     Effort: Pulmonary effort is normal. No respiratory distress.     Breath sounds: Normal breath sounds. No wheezing, rhonchi or rales.  Chest:     Chest wall: No tenderness.  Abdominal:     General: Bowel sounds are normal. There is no distension.     Palpations: Abdomen is soft. There is no mass.     Tenderness: There is no  abdominal tenderness. There is no right CVA tenderness, left CVA tenderness, guarding or rebound.  Musculoskeletal:        General: No swelling.     Right hand: Normal.     Left hand: Swelling and tenderness present. Decreased range of motion. Normal capillary refill. Normal pulse.     Cervical back: Normal range of motion. No rigidity or tenderness.     Right lower leg: No edema.     Left lower leg: No edema.     Comments: Left hand contracture   Lymphadenopathy:     Cervical: No cervical adenopathy.  Skin:    General: Skin is warm and dry.     Coloration: Skin is not pale.     Findings: No bruising, erythema, lesion or rash.  Neurological:     Mental Status: She is alert. Mental status is at baseline.     Motor: No weakness.     Gait: Gait abnormal.  Psychiatric:        Mood and Affect: Mood normal.        Speech: Speech normal.        Behavior: Behavior normal.     Labs reviewed: Recent Labs    02/21/21 0407 02/22/21 0306 11/03/21 0811 11/04/21 0317 11/05/21 0259 11/06/21 0307  NA 134*   < >  --  137 138 139  K 3.5   < >  --  3.9 3.3* 3.5  CL 99   < >  --  112* 112* 112*  CO2 27   < >  --  18* 20* 21*  GLUCOSE 215*   < >  --  233* 113* 93  BUN 7   < >  --  _1 CREATININE 0.59   < >  --  0.64 0.69 0.55  CALCIUM 8.6*   < >  --  8.0* 8.3* 8.4*  MG 1.7   < > 1.7 2.2 2.1  --   PHOS 4.3  --   --   --   --   --    < > = values in this interval not displayed.  Recent Labs    11/02/21 0146 11/02/21 2329 11/03/21 0451  AST 12* 11* 15  ALT _0 ALKPHOS 77 77 77  BILITOT 0.5 1.0 1.0  PROT 6.9 7.5 7.2  ALBUMIN 3.3* 3.6 3.4*   Recent Labs    11/04/21 0317 11/05/21 0259 11/06/21 0307  WBC 16.8* 11.7* 8.6  NEUTROABS 15.0* 7.9* 5.0  HGB 11.8* 12.5 13.0  HCT 37.9 38.3 39.1  MCV 82.0 78.6* 77.4*  PLT 292 318 287   Lab Results  Component Value Date   TSH 1.399 11/02/2021   Lab Results  Component Value Date   HGBA1C 10.5 (H) 10/23/2021   Lab  Results  Component Value Date   CHOL 142 10/23/2021   HDL 51 10/23/2021   LDLCALC 70 10/23/2021   TRIG 127 10/23/2021   CHOLHDL 2.8 10/23/2021    Significant Diagnostic Results in last 30 days:  No results found.  Assessment/Plan 1. Swelling of left hand -apply ice pack compressor to left hand for 10 - 20 minutes daily for swelling  -  Please get left hand X-[facility] at [facility] then will call you with results. - DG Hand Complete Left; Future  2. Pain of left hand - take tylenol 500 mg tablet one by mouth every 8 hrs as needed for pain  - DG Hand Complete Left; Future  3. Essential hypertension B/p elevated  -Continue on metoprolol titrate and amlodipine Will refer to hypertensive clinic if blood pressure still elevated. - continue to monitor B/p and notify provider if B/p > 140/90 -Start on hydrochlorothiazide as below - hydrochlorothiazide (HYDRODIURIL) 12.5 MG tablet; Take 1 tablet (12.5 mg total) by mouth daily.  Dispense: 90 tablet; Refill: 3  Family/ staff Communication: Reviewed plan of care with patient and husband verbalized understanding  Labs/tests ordered: - DG Hand Complete Left; Future  Next Appointment: Return if symptoms worsen or fail to improve.   [name], NP

## 2021-12-22 ENCOUNTER — Other Ambulatory Visit: Payer: Self-pay | Admitting: Family

## 2021-12-22 DIAGNOSIS — F32A Depression, unspecified: Secondary | ICD-10-CM

## 2021-12-22 DIAGNOSIS — G43019 Migraine without aura, intractable, without status migrainosus: Secondary | ICD-10-CM

## 2021-12-25 ENCOUNTER — Telehealth: Payer: Self-pay

## 2021-12-25 NOTE — Telephone Encounter (Signed)
-----   Message from [name], NP sent at 12/25/2021  8:53 AM EST ----- Left hand X-ray shows no fracture.Osteoarthritis noted.Recommend follow up with Orthopedic for further evaluation.

## 2021-12-25 NOTE — Telephone Encounter (Signed)
Informed patient of results and recommendations. 

## 2022-01-01 ENCOUNTER — Encounter: Payer: Medicaid Other | Attending: Physical Medicine & Rehabilitation | Admitting: Physical Medicine & Rehabilitation

## 2022-01-02 ENCOUNTER — Other Ambulatory Visit: Payer: Self-pay | Admitting: Family

## 2022-01-02 ENCOUNTER — Other Ambulatory Visit: Payer: Self-pay | Admitting: Adult Health

## 2022-01-02 DIAGNOSIS — F419 Anxiety disorder, unspecified: Secondary | ICD-10-CM

## 2022-01-02 MED ORDER — LORAZEPAM 0.5 MG PO TABS: 0.5000 mg | ORAL_TABLET | Freq: Every day | ORAL | 0 refills | Status: DC

## 2022-01-02 NOTE — Telephone Encounter (Signed)
Patient has request refill on medication Lorazepam, and Lexapro. Patient medication pend and sent to PCP Ngetich, Nelda Bucks, NP . Unable to refill medication.

## 2022-01-04 ENCOUNTER — Ambulatory Visit: Payer: Medicare Other | Admitting: Family

## 2022-01-06 NOTE — Progress Notes (Unsigned)
Cardiology Office Note:    Date:  01/07/2022   ID:  Lori [name], DOB 08/23/1970, MRN [id number]  PCP:  [name], NP   [facility] Providers Cardiologist:  Lenna Sciara, MD Referring MD: [name], NP   Chief Complaint/Reason for Referral: Establish cardiovascular care  PATIENT DID NOT APPEAR FOR APPOINTMENT   ASSESSMENT:    1. Hx of CABG   2. Type 2 diabetes mellitus with complication, with long-term current use of insulin ([place])   3. Hypertension associated with diabetes ([place])   4. Hyperlipidemia associated with type 2 diabetes mellitus ([place]); goal LDL less than 55 given history of stroke   5. Cerebrovascular accident (CVA), unspecified mechanism ([place])   6. Aortic atherosclerosis ([facility])      PLAN:    In order of problems listed above: 1.  History of CABG:  Continue Plavix in lieu of aspirin given history of stroke, metoprolol and atorvastatin.   2.  Type 2 diabetes: Continue Plavix in lieu of aspirin, atorvastatin; start losartan 25 mg daily and check BMP in 1 week; intolerant of Jardiance due to UTI 3.  Hypertension:  4.  Hyperlipidemia: LDL in September 2023 was 70.  The patient's goal LDL given history of stroke is less than 55.  Check lipid panel, LFTs, LP(a) today.  Refer to pharmacy if not at goal. 5.  History of stroke.  My previous notes indicate this is unlikely to be a PFO mediated strokes and therefore a bubble study will not be pursued.  Continue Plavix, statin, and blood pressure control. 6.  Aortic atherosclerosis: Continue Plavix in lieu of aspirin, statin and Zetia as detailed above with goal LDL less than 55.    Dispo:  No follow-ups on file.      Medication Adjustments/Labs and Tests Ordered: Current medicines are reviewed at length with the patient today.  Concerns regarding medicines are outlined above.  The following changes have been made:     Labs/tests ordered: No orders of the defined types were placed in this  encounter.   Medication Changes: No orders of the defined types were placed in this encounter.    Current medicines are reviewed at length with the patient today.  The patient does not have concerns regarding medicines.   History of Present Illness:    FOCUSED PROBLEM LIST:   1.  Coronary artery disease status post 3V CABG 2018 [facility], [place], [place]) 2.  Type 2 diabetes on insulin; UTI in response to SGLT2 inhibitor  4.  Hypertension 5.  Hyperlipidemia 6.  Pheochromocytoma status post resection 7.  History of multiple strokes with last in 2022; CT demonstrated a lacunar and cerebellar infarction and MRI demonstrated multiple remote infarctions with multiple chronic microhemorrhages likely due to hypertensive etiology (no cortical infarctions); residual left-sided weakness 8.  Aortic atherosclerosis on CT abdomen pelvis 2022 9.  IV dye allergy 10.  ACE inhibitor associated cough  May 2023 consultation: The patient is a 51 y.o. female with the indicated medical history here to establish cardiovascular care.  She unfortunately had a pretty severe stroke last year.  She has had multiple strokes.  She then moved to [place] as this is where she is from.  She has been participating in physical therapy to get stronger.  In terms of other symptoms she denies any recurrent signs or symptoms of stroke, significant shortness of breath, exertional angina, exertional dyspnea, orthopnea, or paroxysmal nocturnal dyspnea.  She has fortunately not required any emergency room visits  or hospitalizations recently.  She does not smoke or drink.  Plan: Obtain echocardiogram, start Jardiance 10 mg daily change to Crestor 40 mg daily given goal LDL less than 55.  Today: The patient was admitted to the hospital in October due to coughing and UTI.  Her ACE inhibitor was not reinitiated.  Her Jardiance was not reinitiated.     Current Medications: No outpatient medications have been marked as taking for the  01/07/22 encounter (Office Visit) with Early Osmond, MD.     Allergies:    Contrast media [iodinated contrast media], Sumatriptan, Morphine, and Penicillins   Social History:   Social History   Tobacco Use   Smoking status: Never   Smokeless tobacco: Never  Vaping Use   Vaping Use: Never used  Substance Use Topics   Alcohol use: Not Currently   Drug use: Never     Family Hx: Family History  Problem Relation Age of Onset   Diabetes Mother    Hypertension Father    Heart attack Father    Dementia Father    Diabetes Brother    Brain cancer Brother    Asthma Daughter    Sickle cell anemia Son      Review of Systems:   Please see the history of present illness.    All other systems reviewed and are negative.     EKGs/Labs/Other Test Reviewed:    EKG:  EKG performed Jun 11, 2021 that I personally reviewed demonstrates sinus tachycardia with PVCs; EKG today that I personally reviewed demonstrates normal sinus rhythm with nonspecific ST and T wave changes  Prior CV studies:  TTE 2022 demonstrate ejection fraction of 60 to 65% with moderate left ventricular hypertrophy and no significant valve abnormalities; no bubble study was performed  Other studies Reviewed: Review of the additional studies/records demonstrates: Aortic atherosclerosis CT abdomen pelvis 2022  Recent Labs: 11/02/2021: TSH 1.399 11/03/2021: ALT 12 11/05/2021: Magnesium 2.1 11/06/2021: BUN 19; Creatinine, Ser 0.55; Hemoglobin 13.0; Platelets 287; Potassium 3.5; Sodium 139   Recent Lipid Panel Lab Results  Component Value Date/Time   CHOL 142 10/23/2021 11:37 AM   TRIG 127 10/23/2021 11:37 AM   HDL 51 10/23/2021 11:37 AM   LDLCALC 70 10/23/2021 11:37 AM    Risk Assessment/Calculations:          Physical Exam:      Signed, Early Osmond, MD  01/07/2022 4:17 PM    [facility] [place], [place], [place]  [address] Phone: [phone]; Fax: [phone]    Note:  This document was prepared using Dragon voice recognition software and may include unintentional dictation errors.

## 2022-01-07 ENCOUNTER — Telehealth: Payer: Self-pay

## 2022-01-07 ENCOUNTER — Ambulatory Visit (INDEPENDENT_AMBULATORY_CARE_PROVIDER_SITE_OTHER): Payer: Medicaid Other | Admitting: Internal Medicine

## 2022-01-07 DIAGNOSIS — F419 Anxiety disorder, unspecified: Secondary | ICD-10-CM

## 2022-01-07 DIAGNOSIS — I639 Cerebral infarction, unspecified: Secondary | ICD-10-CM

## 2022-01-07 DIAGNOSIS — Z794 Long term (current) use of insulin: Secondary | ICD-10-CM

## 2022-01-07 DIAGNOSIS — E785 Hyperlipidemia, unspecified: Secondary | ICD-10-CM

## 2022-01-07 DIAGNOSIS — E118 Type 2 diabetes mellitus with unspecified complications: Secondary | ICD-10-CM

## 2022-01-07 DIAGNOSIS — Z951 Presence of aortocoronary bypass graft: Secondary | ICD-10-CM

## 2022-01-07 DIAGNOSIS — E1159 Type 2 diabetes mellitus with other circulatory complications: Secondary | ICD-10-CM

## 2022-01-07 DIAGNOSIS — I700 Atherosclerosis of aorta: Secondary | ICD-10-CM

## 2022-01-07 DIAGNOSIS — I152 Hypertension secondary to endocrine disorders: Secondary | ICD-10-CM

## 2022-01-07 DIAGNOSIS — E1169 Type 2 diabetes mellitus with other specified complication: Secondary | ICD-10-CM

## 2022-01-07 NOTE — Telephone Encounter (Signed)
Incoming call received from [facility] with [place], requesting a refill on patients lorazepam. I informed Lori [name] that we recently filled rx at [facility] and I will call the patient to see what her preference is.   Spoke with patient, patient states she prefers to receive from [place]. I called [facility] at [place] and left a detailed message requesting that they void rx for lorazeapm sent last week.  RX is now pending for Ngetich, Lori C, NP to approve for [facility].

## 2022-01-08 MED ORDER — LORAZEPAM 0.5 MG PO TABS: 0.5000 mg | ORAL_TABLET | Freq: Every day | ORAL | 5 refills | Status: DC

## 2022-01-08 NOTE — Telephone Encounter (Signed)
Lorazepam refilled as requested.

## 2022-01-09 ENCOUNTER — Telehealth: Payer: Self-pay

## 2022-01-09 ENCOUNTER — Telehealth: Payer: Self-pay | Admitting: Internal Medicine

## 2022-01-09 NOTE — Telephone Encounter (Signed)
Left message for pt to call back.  Message to scheduling to report that the patient did not appear for appointment on 12/4 and to see if she was calling to reschedule that visit.

## 2022-01-09 NOTE — Telephone Encounter (Signed)
Incoming call received from [facility] stating patient is scheduled for surgery tomorrow and they  need documentation from [place] stating patient is not on a blood thinner and it is ok to proceed with surgery tomorrow.  Documentation to be faxed to [phone]

## 2022-01-09 NOTE — Telephone Encounter (Signed)
Patient dont remember coming to her appt 12/4. Would like for the nurse to call her back to go over what was discuss on 12/4.

## 2022-01-09 NOTE — Telephone Encounter (Signed)
A blood thinner is not on patients active medication list. Outgoing call placed to patient, no answer. I left a detailed voicemail for patient with Dinah's response and requested that she call first thing in the morning to discuss with the clinical intake assistant.

## 2022-01-09 NOTE — Telephone Encounter (Signed)
Please verify with patient if taking Plavix or Asprin she was recently seen by Cardiology.According to cardiology's note on 01/07/2022 patient was advised to continue on Plavix.

## 2022-01-09 NOTE — Telephone Encounter (Signed)
Morey Hummingbird with [facility] called stating they never received rx for lorazepam and questioned if I could print rx and manually fax to [phone]  [facility] rx that was sent yesterday was printed and manually faxed as requested.   Side note- I tried to search fax number under the pharmacy listing in patients chart to see if I could add the [facility] associated with the above fax and one did not come up.

## 2022-01-10 ENCOUNTER — Telehealth: Payer: Self-pay | Admitting: *Deleted

## 2022-01-10 NOTE — Telephone Encounter (Signed)
Dawn with [facility] called and stated that patient needs medical clearance for surgery appointment that is scheduled for today at 1:00.   Needs paper stating that patient is NO longer on blood thinners faxed to: [phone]   Please Advise.

## 2022-01-10 NOTE — Telephone Encounter (Signed)
Patient called on call provider Lori [name] and reported that she does not take any Plavix or Asprin.

## 2022-01-10 NOTE — Telephone Encounter (Signed)
Printed this message and faxed to [facility] with [facility].   Routed message to Cardiologist.

## 2022-01-10 NOTE — Telephone Encounter (Signed)
Note was sent to me versus the clinical intake assistant for the day, Rodena Piety.   Telephone encounter was printed and faxed to [facility].

## 2022-01-10 NOTE — Telephone Encounter (Signed)
Patient called on call provider Lori vargus,NP and reported that she does not take any Plavix or Asprin.May send letter for dental clearance stating per patient no longer taking Plavix or Asprin.  Also notify patient's oncologist office.

## 2022-01-14 ENCOUNTER — Other Ambulatory Visit: Payer: Self-pay | Admitting: Family

## 2022-01-14 ENCOUNTER — Telehealth: Payer: Self-pay

## 2022-01-14 DIAGNOSIS — F32A Depression, unspecified: Secondary | ICD-10-CM

## 2022-01-14 DIAGNOSIS — F419 Anxiety disorder, unspecified: Secondary | ICD-10-CM

## 2022-01-14 NOTE — Telephone Encounter (Signed)
Patient reports she is taking bp medication as prescribed but when the nurse came this morning she didn't eat nor take her medication.

## 2022-01-14 NOTE — Telephone Encounter (Addendum)
Patient reports nurse is gone for today,01/14/22 and she have appointment scheduled tomorrow, 01/15/22.

## 2022-01-14 NOTE — Telephone Encounter (Signed)
Patient and home health nurse called reporting BP was 170/120 today, 01/14/22 and scheduled appointment for tomorrow,01/15/22 @ 1:40 pm with Dinah Ngetich,NP.

## 2022-01-14 NOTE — Telephone Encounter (Signed)
Noted  

## 2022-01-14 NOTE — Telephone Encounter (Signed)
Recommend to take B/p medication then recheck B/p in 1-2 hrs.

## 2022-01-14 NOTE — Telephone Encounter (Signed)
Has patient been taking her blood pressure medication?

## 2022-01-15 ENCOUNTER — Ambulatory Visit (INDEPENDENT_AMBULATORY_CARE_PROVIDER_SITE_OTHER): Payer: Medicaid Other | Admitting: Family

## 2022-01-15 ENCOUNTER — Encounter: Payer: Self-pay | Admitting: Family

## 2022-01-15 VITALS — BP 150/80 | HR 81 | Temp 98.0°F | Resp 18 | Ht 62.0 in | Wt 190.2 lb

## 2022-01-15 DIAGNOSIS — I639 Cerebral infarction, unspecified: Secondary | ICD-10-CM

## 2022-01-15 DIAGNOSIS — I10 Essential (primary) hypertension: Secondary | ICD-10-CM

## 2022-01-15 DIAGNOSIS — Z8673 Personal history of transient ischemic attack (TIA), and cerebral infarction without residual deficits: Secondary | ICD-10-CM

## 2022-01-15 DIAGNOSIS — R3981 Functional urinary incontinence: Secondary | ICD-10-CM | POA: Diagnosis not present

## 2022-01-15 MED ORDER — HYDROCHLOROTHIAZIDE 12.5 MG PO TABS: 12.5000 mg | ORAL_TABLET | Freq: Every day | ORAL | 3 refills | Status: DC

## 2022-01-15 NOTE — Progress Notes (Addendum)
Provider: Marlowe Sax FNP-C  Tylisha Danis, Nelda Bucks, NP  Patient Care Team: Teven Mittman, Nelda Bucks, NP as PCP - General (Family Medicine) Early Osmond, MD as PCP - Cardiology (Cardiology)  Extended Emergency Contact Information Primary Emergency Contact: [name],[name] Mobile Phone: [phone] Relation: Spouse Preferred language: English Interpreter needed? No Secondary Emergency Contact: [place] Phone: [phone] Mobile Phone: [phone] Relation: Daughter Mother: Gayla Doss Phone: [phone]  Code Status:  Full Code  Goals of care: Advanced Directive information    01/15/2022    1:25 PM  Advanced Directives  Does Patient Have a Medical Advance Directive? No  Would patient like information on creating a medical advance directive? No - Patient declined     Chief Complaint  Patient presents with   Acute Visit    Patient complains of elevated blood pressure. Patient states that Hydrochlorothiazide/Amlodipine helped her better together. Patient states that she needs more home health hours. Patient request 24hr care but will be fine having 8hrs.     HPI:  Pt is a 51 y.o. female seen today for an acute visit for evaluation of high blood pressure.States had a home health Nurse visit checked her B/p which was elevated though had not taken any B/p medication.Has been out of her Hydrochlorothiazide for several days. B/p elevated this visit. She denies any headache,dizziness,vision changes,fatigue,chest tightness,palpitation,chest pain or shortness of breath.    She had an [place] 2 filled out previously for transfer to higher level of care but states does not want to move into a Nursing facility. She request forms filled out for additional hours with her Nurse aide to assist with her ADL's.   Also requires Hospital bed to assist with getting in and out of the bed and repositioning in the bed.Patient has medical history of left hand contracture and  immobility and incontinence that require repositioning in bed.   Past Medical History:  Diagnosis Date   CAD (coronary artery disease)    Depression    Diabetes mellitus without complication ([place])    History of CT scan    History of mammogram    History of MRI    Hypertension    Pheochromocytoma    Pheochromocytoma    Stroke ([place])    Thyroid disease    Past Surgical History:  Procedure Laterality Date   ABDOMINAL HYSTERECTOMY     CESAREAN SECTION     3   CORONARY ARTERY BYPASS GRAFT     Right adrenal gland removal for pheochromocytoma Right     Allergies  Allergen Reactions   Contrast Media [Iodinated Contrast Media] Anaphylaxis and Swelling   Sumatriptan Anaphylaxis   Morphine Swelling   Penicillins Swelling    Mouth swells up and eyes swollen shut    Outpatient Encounter Medications as of 01/15/2022  Medication Sig   albuterol (PROVENTIL) (2.5 MG/3ML) 0.083% nebulizer solution Take 3 mLs (2.5 mg total) by nebulization every 6 (six) hours as needed for wheezing or shortness of breath.   amLODipine (NORVASC) 10 MG tablet Take 1 tablet (10 mg total) by mouth daily.   atorvastatin (LIPITOR) 80 MG tablet Take 80 mg by mouth daily.   blood glucose meter kit and supplies KIT Dispense based on patient and insurance preference. Use up to four times daily as directed.   budesonide (PULMICORT) 0.25 MG/2ML nebulizer solution Take 2 mLs (0.25 mg total) by nebulization 2 (two) times daily.   Continuous Blood Gluc Receiver (DEXCOM G5 RECEIVER KIT) DEVI 1 Device by Does not apply route  4 (four) times daily.   cyclobenzaprine (FLEXERIL) 5 MG tablet TAKE 1 TABLET(5 MG) BY MOUTH TWICE DAILY FOR UP TO 7 DAYS AS NEEDED FOR MUSCLE SPASMS   diclofenac Sodium (VOLTAREN) 1 % GEL Apply 4 g topically 4 (four) times daily.   diphenhydrAMINE (BENADRYL) 25 MG tablet Take 25 mg by mouth at bedtime.   escitalopram (LEXAPRO) 20 MG tablet TAKE 1 TABLET BY MOUTH EVERY MORNING   ezetimibe (ZETIA) 10 MG  tablet Take 1 tablet (10 mg total) by mouth daily.   gabapentin (NEURONTIN) 100 MG capsule TAKE ONE CAPSULE BY MOUTH THREE TIMES DAILY FOR NEUROPATHY   hydrochlorothiazide (HYDRODIURIL) 12.5 MG tablet Take 1 tablet (12.5 mg total) by mouth daily.   hydrOXYzine (ATARAX) 25 MG tablet Take 1 tablet (25 mg total) by mouth at bedtime as needed for anxiety.   insulin aspart (NOVOLOG) 100 UNIT/ML FlexPen Inject 7 Units into the skin 3 (three) times daily with meals.   insulin glargine (LANTUS) 100 UNIT/ML Solostar Pen Inject 38 Units into the skin at bedtime.   Insulin Pen Needle 32G X 4 MM MISC Use to inject insulin 4 times daily as directed.   ipratropium-albuterol (DUONEB) 0.5-2.5 (3) MG/3ML SOLN Take 3 mLs by nebulization 2 (two) times daily.   LORazepam (ATIVAN) 0.5 MG tablet Take 1 tablet (0.5 mg total) by mouth at bedtime.   MELATONIN GUMMIES PO Take 9 mg by mouth at bedtime as needed.   methimazole (TAPAZOLE) 5 MG tablet TAKE 1 TABLET BY MOUTH EVERY MORNING   metoCLOPramide (REGLAN) 10 MG/10ML SOLN Take 5 mLs (5 mg total) by mouth 4 (four) times daily -  before meals and at bedtime for 14 days.   metoprolol tartrate (LOPRESSOR) 100 MG tablet TAKE 1 TABLET BY MOUTH TWICE DAILY (BREAKFAST, BEDTIME)   nystatin cream (MYCOSTATIN) Apply 1 Application topically 2 (two) times daily. Affected areas on breast fold and groin and sacral areas   pantoprazole (PROTONIX) 40 MG tablet Take 1 tablet (40 mg total) by mouth every morning.   senna (SENOKOT) 8.6 MG tablet Take 8.6 mg by mouth as needed for constipation.   tamsulosin (FLOMAX) 0.4 MG CAPS capsule TAKE 1 CAPSULE BY MOUTH AT BEDTIME   topiramate (TOPAMAX) 25 MG tablet TAKE 1 TABLET BY MOUTH TWICE DAILY FOR NEUROPATHY   zinc oxide (BALMEX) 11.3 % CREA cream Apply 1 Application topically 2 (two) times daily.   No facility-administered encounter medications on file as of 01/15/2022.    Review of Systems  Constitutional:  Negative for appetite change,  chills, fatigue, fever and unexpected weight change.  HENT:  Negative for congestion, ear discharge, ear pain, facial swelling, hearing loss, nosebleeds, postnasal drip, rhinorrhea, sinus pressure, sinus pain, sneezing, sore throat and tinnitus.   Eyes:  Negative for pain, discharge, redness, itching and visual disturbance.  Respiratory:  Negative for cough, chest tightness, shortness of breath and wheezing.   Cardiovascular:  Negative for chest pain, palpitations and leg swelling.  Gastrointestinal:  Negative for abdominal distention, abdominal pain, constipation, diarrhea, nausea and vomiting.  Genitourinary:  Negative for difficulty urinating, dysuria, flank pain, frequency and urgency.  Musculoskeletal:  Positive for gait problem. Negative for arthralgias, back pain, joint swelling, myalgias, neck pain and neck stiffness.  Skin:  Negative for color change, pallor and rash.  Neurological:  Positive for weakness. Negative for dizziness, syncope, speech difficulty, light-headedness, numbness and headaches.       Left hand contracture   Psychiatric/Behavioral:  Negative for agitation, behavioral problems,  confusion, hallucinations, sleep disturbance and suicidal ideas. The patient is not nervous/anxious.      There is no immunization history on file for this patient. Pertinent  Health Maintenance Due  Topic Date Due   FOOT EXAM  Never done   OPHTHALMOLOGY EXAM  Never done   COLONOSCOPY (Pts 45-12yr Insurance coverage will need to be confirmed)  Never done   INFLUENZA VACCINE  Never done   HEMOGLOBIN A1C  04/23/2022   PAP SMEAR-Modifier  Discontinued      11/07/2021    8:50 AM 11/07/2021   11:00 PM 11/22/2021    1:36 PM 12/20/2021    1:46 PM 01/15/2022    1:25 PM  Fall Risk  Falls in the past year?   _0 Was there an injury with Fall?   _1 Fall Risk Category Calculator   _2 Fall Risk Category   High High High  Patient Fall Risk Level Low fall risk Low fall risk High fall  risk High fall risk Moderate fall risk  Patient at Risk for Falls Due to   History of fall(s);Impaired balance/gait;Impaired mobility History of fall(s);Impaired balance/gait;Impaired mobility History of fall(s);Impaired balance/gait;Impaired mobility  Fall risk Follow up   Falls evaluation completed;Education provided;Falls prevention discussed Falls evaluation completed;Education provided;Falls prevention discussed Falls evaluation completed;Education provided;Falls prevention discussed   Functional Status Survey:    Vitals:   01/15/22 1335  BP: (!) 150/80  Pulse: 81  Resp: 18  Temp: 98 F (36.7 C)  SpO2: 98%  Weight: 190 lb 3.2 oz (86.3 kg)  Height: _3  (1.575 m)   Body mass index is 34.79 kg/m. Physical Exam Vitals reviewed.  Constitutional:      General: She is not in acute distress.    Appearance: Normal appearance. She is normal weight. She is not ill-appearing or diaphoretic.  HENT:     Head: Normocephalic.     Nose: Nose normal. No congestion or rhinorrhea.     Mouth/Throat:     Mouth: Mucous membranes are moist.     Pharynx: Oropharynx is clear. No oropharyngeal exudate or posterior oropharyngeal erythema.  Eyes:     General: No scleral icterus.       Right eye: No discharge.        Left eye: No discharge.     Conjunctiva/sclera: Conjunctivae normal.     Pupils: Pupils are equal, round, and reactive to light.  Neck:     Vascular: No carotid bruit.  Cardiovascular:     Rate and Rhythm: Normal rate and regular rhythm.     Pulses: Normal pulses.     Heart sounds: Normal heart sounds. No murmur heard.    No friction rub. No gallop.  Pulmonary:     Effort: Pulmonary effort is normal. No respiratory distress.     Breath sounds: Normal breath sounds. No wheezing, rhonchi or rales.  Chest:     Chest wall: No tenderness.  Abdominal:     General: Bowel sounds are normal. There is no distension.     Palpations: Abdomen is soft. There is no mass.     Tenderness:  There is no abdominal tenderness. There is no right CVA tenderness, left CVA tenderness, guarding or rebound.  Musculoskeletal:        General: No swelling or tenderness. Normal range of motion.     Cervical back: Normal range of motion. No rigidity or tenderness.     Right lower leg: No  edema.     Left lower leg: No edema.  Lymphadenopathy:     Cervical: No cervical adenopathy.  Skin:    General: Skin is warm and dry.     Coloration: Skin is not pale.     Findings: No erythema or rash.  Neurological:     Mental Status: She is alert and oriented to person, place, and time.     Cranial Nerves: No cranial nerve deficit.     Sensory: No sensory deficit.     Motor: Weakness present.     Coordination: Coordination normal.     Gait: Gait abnormal.     Comments: Left hand weakness   Psychiatric:        Mood and Affect: Mood normal.        Speech: Speech normal.        Behavior: Behavior normal.     Labs reviewed: Recent Labs    02/21/21 0407 02/22/21 0306 11/03/21 0811 11/04/21 0317 11/05/21 0259 11/06/21 0307  NA 134*   < >  --  137 138 139  K 3.5   < >  --  3.9 3.3* 3.5  CL 99   < >  --  112* 112* 112*  CO2 27   < >  --  18* 20* 21*  GLUCOSE 215*   < >  --  233* 113* 93  BUN 7   < >  --  _0 CREATININE 0.59   < >  --  0.64 0.69 0.55  CALCIUM 8.6*   < >  --  8.0* 8.3* 8.4*  MG 1.7   < > 1.7 2.2 2.1  --   PHOS 4.3  --   --   --   --   --    < > = values in this interval not displayed.   Recent Labs    11/02/21 0146 11/02/21 2329 11/03/21 0451  AST 12* 11* 15  ALT _1 ALKPHOS 77 77 77  BILITOT 0.5 1.0 1.0  PROT 6.9 7.5 7.2  ALBUMIN 3.3* 3.6 3.4*   Recent Labs    11/04/21 0317 11/05/21 0259 11/06/21 0307  WBC 16.8* 11.7* 8.6  NEUTROABS 15.0* 7.9* 5.0  HGB 11.8* 12.5 13.0  HCT 37.9 38.3 39.1  MCV 82.0 78.6* 77.4*  PLT 292 318 287   Lab Results  Component Value Date   TSH 1.399 11/02/2021   Lab Results  Component Value Date   HGBA1C 10.5  (H) 10/23/2021   Lab Results  Component Value Date   CHOL 142 10/23/2021   HDL 51 10/23/2021   LDLCALC 70 10/23/2021   TRIG 127 10/23/2021   CHOLHDL 2.8 10/23/2021    Significant Diagnostic Results in last 30 days:  DG Hand Complete Left  Result Date: 12/22/2021 CLINICAL DATA:  Left hand pain and swelling. Following week ago. Patient unable to straighten fingers. EXAM: LEFT HAND - COMPLETE 3+ VIEW COMPARISON:  None Available. FINDINGS: There is diffuse decreased bone mineralization. The third through fifth fingers are mildly flexed on all images, limiting evaluation of the interphalangeal joint spaces. Likely moderate third through fifth interphalangeal joint space narrowing. More mild thumb and index finger interphalangeal joint space narrowing. No acute fracture or dislocation. 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 Flexion positioning of the third through fifth fingers on all views. No acute fracture is seen. Electronically Signed   By: Yvonne Kendall M.D.   On: 12/22/2021 17:42    Assessment/Plan 1.  Essential hypertension B/p elevated on arrival.Has been out of hydrochlorothiazide will refill today - continue on metoprolol and amlodipine    2. History of CVA (cerebrovascular accident) Left hemiparesis - continue to work with PT for spasm also getting Botox injection. - requires Hospital bed to assist with getting in and out of the bed and repositioning in the bed. 3. Functional urinary incontinence Requires assistance with her ADL's  -forms completed for additional services. - She requires Hospital bed to assist with getting in and out of the bed and repositioning in the bed during peri-care.   Family/ staff Communication: Reviewed plan of care with patient and Husband verbalized understanding.  Labs/tests ordered: None   Next Appointment: Return if symptoms worsen or fail to improve.   [name], NP

## 2022-01-16 ENCOUNTER — Telehealth: Payer: Self-pay

## 2022-01-16 DIAGNOSIS — I10 Essential (primary) hypertension: Secondary | ICD-10-CM

## 2022-01-16 NOTE — Telephone Encounter (Signed)
Virl Cagey from [facility] called and states that patient blood pressure has been high. She states that patient has been taking blood pressure medication as directed and she wanted to speak directly to PCP Ngetich, Dinah C, NP about patient. She states patient current blood pressure is 188/112. Patient reports dizziness and bad headache. She states that this has been ongoing for patient and if symptoms worsen she will call EMS. I called back and no answer. Voicemail was left with office call back number.

## 2022-01-16 NOTE — Telephone Encounter (Signed)
Hydrochlorothiazide script send to pharmacy yesterday did she take it today. Recommend clonidine 0.1 mg tablet every 8 hrs as needed if B/p > 150/90  Will refer to hypertensive clinic for high blood pressure.

## 2022-01-18 NOTE — Telephone Encounter (Signed)
Called patient and no answer. Voicemail was left with office call back number.   

## 2022-01-22 ENCOUNTER — Telehealth: Payer: Self-pay

## 2022-01-22 NOTE — Progress Notes (Incomplete)
Advanced Hypertension Clinic Initial Assessment:    Date:  01/22/2022   ID:  Lori [name], DOB 1970/04/08, MRN [id number]  PCP:  Lori Hughs, NP  Cardiologist:  Lori Osmond, MD  Nephrologist:  Referring MD: Lori Hughs, NP   CC: Hypertension  History of Present Illness:    Lori [name] is a 51 y.o. female with a hx of hypertension, *** here to establish care in the Advanced Hypertension Clinic.   Today,  *** denies any palpitations, chest pain, shortness of breath, or peripheral edema. No lightheadedness, headaches, syncope, orthopnea, or PND.  (+)  Previous antihypertensives:   Past Medical History:  Diagnosis Date   CAD (coronary artery disease)    Depression    Diabetes mellitus without complication ([place])    History of CT scan    History of mammogram    History of MRI    Hypertension    Pheochromocytoma    Pheochromocytoma    Stroke ([place])    Thyroid disease     Past Surgical History:  Procedure Laterality Date   ABDOMINAL HYSTERECTOMY     CESAREAN SECTION     3   CORONARY ARTERY BYPASS GRAFT     Right adrenal gland removal for pheochromocytoma Right     Current Medications: No outpatient medications have been marked as taking for the 01/23/22 encounter (Appointment) with Skeet Latch, MD.     Allergies:   Contrast media [iodinated contrast media], Sumatriptan, Morphine, and Penicillins   Social History   Socioeconomic History   Marital status: Married    Spouse name: Not on file   Number of children: Not on file   Years of education: Not on file   Highest education level: Not on file  Occupational History   Not on file  Tobacco Use   Smoking status: Never   Smokeless tobacco: Never  Vaping Use   Vaping Use: Never used  Substance and Sexual Activity   Alcohol use: Not Currently   Drug use: Never   Sexual activity: Not on file  Other Topics Concern   Not on file  Social History Narrative   Tobacco use,  amount per day now: Never   Past tobacco use, amount per day: Never   How many years did you use tobacco: Never   Alcohol use (drinks per week): Not Currently.   Diet: Eat out a lot.    Do you drink/eat things with caffeine: Sweet Tea   Marital status:   Married                               What year were you married? 2000   Do you live in a house, apartment, assisted living, condo, trailer, etc.? House   Is it one or more stories? 1   How many persons live in your home? 2   Do you have pets in your home?( please list) No   Highest Level of education completed? Bachelors Degree.   Current or past profession: Location manager, [employer].   Do you exercise?   No                               Type and how often?   Do you have a living will? No   Do you have a DNR form?  No                            If not, do you want to discuss one?   Do you have signed POA/HPOA forms?   No                     If so, please bring to you appointment      Do you have any difficulty bathing or dressing yourself? Yes   Do you have any difficulty preparing food or eating? Yes   Do you have any difficulty managing your medications? Yes   Do you have any difficulty managing your finances? No   Do you have any difficulty affording your medications? Yes   Social Determinants of Health   Financial Resource Strain: Not on file  Food Insecurity: Not on file  Transportation Needs: Not on file  Physical Activity: Not on file  Stress: Not on file  Social Connections: Not on file     Family History: The patient's family history includes Asthma in her daughter; Brain cancer in her brother; Dementia in her father; Diabetes in her brother and mother; Heart attack in her father; Hypertension in her father; Sickle cell anemia in her son.  ROS:   Please see the history of present illness.     All other systems reviewed and are negative.  EKGs/Labs/Other Studies  Reviewed:    ***  EKG:   : Sinus ***. Rate *** bpm.  Recent Labs: 11/02/2021: TSH 1.399 11/03/2021: ALT 12 11/05/2021: Magnesium 2.1 11/06/2021: BUN 19; Creatinine, Ser 0.55; Hemoglobin 13.0; Platelets 287; Potassium 3.5; Sodium 139   Recent Lipid Panel    Component Value Date/Time   CHOL 142 10/23/2021 1137   TRIG 127 10/23/2021 1137   HDL 51 10/23/2021 1137   CHOLHDL 2.8 10/23/2021 1137   VLDL 16 01/20/2021 0709   LDLCALC 70 10/23/2021 1137    Physical Exam:    VS:  There were no vitals taken for this visit. , BMI There is no height or weight on file to calculate BMI. GENERAL:  Well appearing HEENT: Pupils equal round and reactive, fundi not visualized, oral mucosa unremarkable NECK:  No jugular venous distention, waveform within normal limits, carotid upstroke brisk and symmetric, no bruits, no thyromegaly LYMPHATICS:  No cervical adenopathy LUNGS:  Clear to auscultation bilaterally HEART:  RRR.  PMI not displaced or sustained,S1 and S2 within normal limits, no S3, no S4, no clicks, no rubs, *** murmurs ABD:  Flat, positive bowel sounds normal in frequency in pitch, no bruits, no rebound, no guarding, no midline pulsatile mass, no hepatomegaly, no splenomegaly EXT:  2 plus pulses throughout, no edema, no cyanosis, no clubbing SKIN:  No rashes, no nodules NEURO:  Cranial nerves II through XII grossly intact, motor grossly intact throughout PSYCH:  Cognitively intact, oriented to person place and time   ASSESSMENT/PLAN:    No problem-specific Assessment & Plan notes found for this encounter.   Screening for Secondary Hypertension: { Click here to document screening for secondary causes of HTN  :[id number]}    Relevant Labs/Studies:    Latest Ref Rng & Units 11/06/2021    3:07 AM 11/05/2021    2:59 AM 11/04/2021    3:17 AM  Basic Labs  Sodium 135 - 145 mmol/L 139  138  137   Potassium 3.5 - 5.1 mmol/L 3.5  3.3  3.9   Creatinine 0.44 -  1.00 mg/dL 0.55  0.69  0.64         Latest Ref Rng & Units 11/02/2021    2:46 AM 10/23/2021   11:37 AM  Thyroid   TSH 0.350 - 4.500 uIU/mL 1.399  0.93                     she consents to be monitored in our remote patient monitoring program through [place].  she will track his blood pressure twice daily and understands that these trends will help [place] to adjust her medications as needed prior to his next appointment.  she *** interested in enrolling in the PREP exercise and nutrition program through the [facility].     Disposition:   *** FU with APP/PharmD in 1 month for the next 3 months.   FU with [name] C. Oval Linsey, MD, [facility] in 4 months.   Medication Adjustments/Labs and Tests Ordered: Current medicines are reviewed at length with the patient today.  Concerns regarding medicines are outlined above.   No orders of the defined types were placed in this encounter.  No orders of the defined types were placed in this encounter.   I,Mathew Stumpf,acting as a Education administrator for Skeet Latch, MD.,have documented all relevant documentation on the behalf of Skeet Latch, MD,as directed by  Skeet Latch, MD while in the presence of Skeet Latch, MD.  ***  Signed, Madelin Rear  01/22/2022 11:43 AM    [place]

## 2022-01-22 NOTE — Telephone Encounter (Signed)
Message left on clinical intake voicemail:   Lori [name] with [facility] called to inquire about the status of rx approval for generic lexapro.  Call was returned to [name], I left a detailed message informing her that rx was approved on 01/04/2022 for #30 with 8 additional refills. Receipt confirmed by pharmacy was at 2:47 pm on 01/04/22.

## 2022-01-23 ENCOUNTER — Encounter: Payer: Self-pay | Admitting: Family

## 2022-01-23 ENCOUNTER — Encounter (HOSPITAL_BASED_OUTPATIENT_CLINIC_OR_DEPARTMENT_OTHER): Payer: Self-pay | Admitting: Cardiovascular Disease

## 2022-01-23 ENCOUNTER — Ambulatory Visit (INDEPENDENT_AMBULATORY_CARE_PROVIDER_SITE_OTHER): Payer: Medicaid Other | Admitting: Cardiovascular Disease

## 2022-01-23 ENCOUNTER — Ambulatory Visit (HOSPITAL_BASED_OUTPATIENT_CLINIC_OR_DEPARTMENT_OTHER): Payer: Medicaid Other | Admitting: Cardiovascular Disease

## 2022-01-23 ENCOUNTER — Ambulatory Visit (INDEPENDENT_AMBULATORY_CARE_PROVIDER_SITE_OTHER): Payer: Medicaid Other | Admitting: Family

## 2022-01-23 VITALS — BP 166/112 | HR 103 | Ht 62.0 in | Wt 188.9 lb

## 2022-01-23 VITALS — BP 127/88 | HR 97 | Temp 96.9°F | Resp 18 | Ht 62.0 in | Wt 190.2 lb

## 2022-01-23 DIAGNOSIS — R051 Acute cough: Secondary | ICD-10-CM | POA: Diagnosis not present

## 2022-01-23 DIAGNOSIS — K5901 Slow transit constipation: Secondary | ICD-10-CM | POA: Diagnosis not present

## 2022-01-23 DIAGNOSIS — D3500 Benign neoplasm of unspecified adrenal gland: Secondary | ICD-10-CM

## 2022-01-23 DIAGNOSIS — R0681 Apnea, not elsewhere classified: Secondary | ICD-10-CM

## 2022-01-23 DIAGNOSIS — I639 Cerebral infarction, unspecified: Secondary | ICD-10-CM

## 2022-01-23 DIAGNOSIS — Z006 Encounter for examination for normal comparison and control in clinical research program: Secondary | ICD-10-CM

## 2022-01-23 DIAGNOSIS — I10 Essential (primary) hypertension: Secondary | ICD-10-CM | POA: Diagnosis not present

## 2022-01-23 DIAGNOSIS — E7849 Other hyperlipidemia: Secondary | ICD-10-CM

## 2022-01-23 LAB — POCT INFLUENZA A/B
Influenza A, POC: NEGATIVE
Influenza B, POC: NEGATIVE

## 2022-01-23 LAB — POC COVID19 BINAXNOW: SARS Coronavirus 2 Ag: NEGATIVE

## 2022-01-23 MED ORDER — GUAIFENESIN ER 600 MG PO TB12: 600.0000 mg | ORAL_TABLET | Freq: Two times a day (BID) | ORAL | 0 refills | Status: DC

## 2022-01-23 MED ORDER — POLYETHYLENE GLYCOL 3350 17 GM/SCOOP PO POWD: 17.0000 g | Freq: Every day | ORAL | 1 refills | Status: DC

## 2022-01-23 MED ORDER — OLMESARTAN MEDOXOMIL-HCTZ 40-25 MG PO TABS: 1.0000 | ORAL_TABLET | Freq: Every day | ORAL | 3 refills | Status: DC

## 2022-01-23 NOTE — Progress Notes (Deleted)
Cardiology Office Note   Date:  01/23/2022   ID:  Lori [name], DOB 01-Oct-1970, MRN [id number]  PCP:  [name], NP  Cardiologist:   Skeet Latch, MD   No chief complaint on file.     History of Present Illness: Lori [name] is a 51 y.o. female who presents for ***    Past Medical History:  Diagnosis Date   CAD (coronary artery disease)    Depression    Diabetes mellitus without complication ([place])    History of CT scan    History of mammogram    History of MRI    Hypertension    Pheochromocytoma    Pheochromocytoma    Stroke ([place])    Thyroid disease     Past Surgical History:  Procedure Laterality Date   ABDOMINAL HYSTERECTOMY     CESAREAN SECTION     3   CORONARY ARTERY BYPASS GRAFT     Right adrenal gland removal for pheochromocytoma Right      Current Outpatient Medications  Medication Sig Dispense Refill   albuterol (PROVENTIL) (2.5 MG/3ML) 0.083% nebulizer solution Take 3 mLs (2.5 mg total) by nebulization every 6 (six) hours as needed for wheezing or shortness of breath. 75 mL 12   amLODipine (NORVASC) 10 MG tablet Take 1 tablet (10 mg total) by mouth daily. 30 tablet 0   atorvastatin (LIPITOR) 80 MG tablet Take 80 mg by mouth daily.     blood glucose meter kit and supplies KIT Dispense based on patient and insurance preference. Use up to four times daily as directed. 1 each 0   budesonide (PULMICORT) 0.25 MG/2ML nebulizer solution Take 2 mLs (0.25 mg total) by nebulization 2 (two) times daily. 60 mL 12   Continuous Blood Gluc Receiver (DEXCOM G5 RECEIVER KIT) DEVI 1 Device by Does not apply route 4 (four) times daily. 1 each 5   cyclobenzaprine (FLEXERIL) 5 MG tablet TAKE 1 TABLET(5 MG) BY MOUTH TWICE DAILY FOR UP TO 7 DAYS AS NEEDED FOR MUSCLE SPASMS 30 tablet 3   diclofenac Sodium (VOLTAREN) 1 % GEL Apply 4 g topically 4 (four) times daily. 200 g 0   diphenhydrAMINE (BENADRYL) 25 MG tablet Take 25 mg by mouth at bedtime.      escitalopram (LEXAPRO) 20 MG tablet TAKE 1 TABLET BY MOUTH EVERY MORNING 30 tablet 8   ezetimibe (ZETIA) 10 MG tablet Take 1 tablet (10 mg total) by mouth daily. 90 tablet 1   gabapentin (NEURONTIN) 100 MG capsule TAKE ONE CAPSULE BY MOUTH THREE TIMES DAILY FOR NEUROPATHY 90 capsule 1   guaiFENesin (MUCINEX) 600 MG 12 hr tablet Take 1 tablet (600 mg total) by mouth 2 (two) times daily. 30 tablet 0   hydrochlorothiazide (HYDRODIURIL) 12.5 MG tablet Take 1 tablet (12.5 mg total) by mouth daily. 90 tablet 3   hydrOXYzine (ATARAX) 25 MG tablet Take 1 tablet (25 mg total) by mouth at bedtime as needed for anxiety. 30 tablet 0   insulin aspart (NOVOLOG) 100 UNIT/ML FlexPen Inject 7 Units into the skin 3 (three) times daily with meals.     insulin glargine (LANTUS) 100 UNIT/ML Solostar Pen Inject 38 Units into the skin at bedtime.     Insulin Pen Needle 32G X 4 MM MISC Use to inject insulin 4 times daily as directed. 200 each 0   ipratropium-albuterol (DUONEB) 0.5-2.5 (3) MG/3ML SOLN Take 3 mLs by nebulization 2 (two) times daily. 360 mL 5   LORazepam (  ATIVAN) 0.5 MG tablet Take 1 tablet (0.5 mg total) by mouth at bedtime. 30 tablet 5   MELATONIN GUMMIES PO Take 9 mg by mouth at bedtime as needed.     methimazole (TAPAZOLE) 5 MG tablet TAKE 1 TABLET BY MOUTH EVERY MORNING 90 tablet 2   metoprolol tartrate (LOPRESSOR) 100 MG tablet TAKE 1 TABLET BY MOUTH TWICE DAILY (BREAKFAST, BEDTIME) 180 tablet 2   nystatin cream (MYCOSTATIN) Apply 1 Application topically 2 (two) times daily. Affected areas on breast fold and groin and sacral areas 30 g 0   pantoprazole (PROTONIX) 40 MG tablet Take 1 tablet (40 mg total) by mouth every morning. 90 tablet 2   polyethylene glycol powder (GLYCOLAX/MIRALAX) 17 GM/SCOOP powder Take 17 g by mouth daily. Hold for loose stool 3350 g 1   senna (SENOKOT) 8.6 MG tablet Take 8.6 mg by mouth as needed for constipation.     tamsulosin (FLOMAX) 0.4 MG CAPS capsule TAKE 1 CAPSULE BY  MOUTH AT BEDTIME 90 capsule 2   topiramate (TOPAMAX) 25 MG tablet TAKE 1 TABLET BY MOUTH TWICE DAILY FOR NEUROPATHY 60 tablet 3   zinc oxide (BALMEX) 11.3 % CREA cream Apply 1 Application topically 2 (two) times daily. 113 g 5   metoCLOPramide (REGLAN) 10 MG/10ML SOLN Take 5 mLs (5 mg total) by mouth 4 (four) times daily -  before meals and at bedtime for 14 days. 280 mL 0   No current facility-administered medications for this visit.    Allergies:   Contrast media [iodinated contrast media], Sumatriptan, Morphine, and Penicillins    Social History:  The patient  reports that she has never smoked. She has never used smokeless tobacco. She reports that she does not currently use alcohol. She reports that she does not use drugs.   Family History:  The patient's ***family history includes Asthma in her daughter; Brain cancer in her brother; Dementia in her father; Diabetes in her brother and mother; Heart attack in her father; Hypertension in her father; Sickle cell anemia in her son.    ROS:  Please see the history of present illness.   Otherwise, review of systems are positive for {NONE DEFAULTED:18576}.   All other systems are reviewed and negative.    PHYSICAL EXAM: VS:  BP (!) 148/108 (BP Location: Left Arm, Patient Position: Sitting, Cuff Size: Large)   Pulse (!) 103   Ht _0  (1.575 m)   Wt 188 lb 14.4 oz (85.7 kg)   BMI 34.55 kg/m  , BMI Body mass index is 34.55 kg/m. GENERAL:  Well appearing HEENT:  Pupils equal round and reactive, fundi not visualized, oral mucosa unremarkable NECK:  No jugular venous distention, waveform within normal limits, carotid upstroke brisk and symmetric, no bruits, no thyromegaly LYMPHATICS:  No cervical adenopathy LUNGS:  Clear to auscultation bilaterally HEART:  RRR.  PMI not displaced or sustained,S1 and S2 within normal limits, no S3, no S4, no clicks, no rubs, *** murmurs ABD:  Flat, positive bowel sounds normal in frequency in pitch, no  bruits, no rebound, no guarding, no midline pulsatile mass, no hepatomegaly, no splenomegaly EXT:  2 plus pulses throughout, no edema, no cyanosis no clubbing SKIN:  No rashes no nodules NEURO:  Cranial nerves II through XII grossly intact, motor grossly intact throughout PSYCH:  Cognitively intact, oriented to person place and time    EKG:  EKG {ACTION; IS/IS POE:42353614} ordered today. The ekg ordered today demonstrates ***   Recent Labs: 11/02/2021: TSH  1.399 11/03/2021: ALT 12 11/05/2021: Magnesium 2.1 11/06/2021: BUN 19; Creatinine, Ser 0.55; Hemoglobin 13.0; Platelets 287; Potassium 3.5; Sodium 139    Lipid Panel    Component Value Date/Time   CHOL 142 10/23/2021 1137   TRIG 127 10/23/2021 1137   HDL 51 10/23/2021 1137   CHOLHDL 2.8 10/23/2021 1137   VLDL 16 01/20/2021 0709   LDLCALC 70 10/23/2021 1137      Wt Readings from Last 3 Encounters:  01/23/22 188 lb 14.4 oz (85.7 kg)  01/23/22 190 lb 3.2 oz (86.3 kg)  01/15/22 190 lb 3.2 oz (86.3 kg)      ASSESSMENT AND PLAN:  ***   Current medicines are reviewed at length with the patient today.  The patient {ACTIONS; HAS/DOES NOT HAVE:19233} concerns regarding medicines.  The following changes have been made:  {PLAN; NO CHANGE:13088:s}  Labs/ tests ordered today include: *** No orders of the defined types were placed in this encounter.    Disposition:   FU with ***     Signed, Zayah Keilman C. Oval Linsey, MD, [facility]  01/23/2022 3:14 PM    [facility]

## 2022-01-23 NOTE — Patient Instructions (Addendum)
Medication Instructions:  STOP HYDROCHLOROTHIAZIDE  START OLMESARTAN-HCT 40-25 MG DAILY   RESTART YOUR CLOPIDOGREL (PLAVIX) AFTER YOUR DENTAL SURGERY    Labwork: RENIN/ALDOSTERONE/CATACHOLAMINES/METANEPHRINES/BMET 1 WEEK AFTER MAKING CHANGE   Testing/Procedures: Your physician has requested that you have a carotid duplex. This test is an ultrasound of the carotid arteries in your neck. It looks at blood flow through these arteries that supply the brain with blood. Allow one hour for this exam. There are no restrictions or special instructions.  Your physician has recommended that you have a sleep study. This test records several body functions during sleep, including: brain activity, eye movement, oxygen and carbon dioxide blood levels, heart rate and rhythm, breathing rate and rhythm, the flow of air through your mouth and nose, snoring, body muscle movements, and chest and belly movement. THE OFFICE WILL CALL TO SCHEDULE YOU ONCE INSURANCE HAS BEEN REVIEWED    Follow-Up: 02/26/2022 1:30 PM WITH PHARM D   Special Instructions:   MONITOR YOUR BLOOD PRESSURE WITH MACHINE PROVIDED TWICE A DAY, LOG IN THE BOOK PROVIDED. BRING THE BOOK   TO YOUR FOLLOW UP IN 1 MONTH   DASH Eating Plan DASH stands for "Dietary Approaches to Stop Hypertension." The DASH eating plan is a healthy eating plan that has been shown to reduce high blood pressure (hypertension). It may also reduce your risk for type 2 diabetes, heart disease, and stroke. The DASH eating plan may also help with weight loss. What are tips for following this plan?  General guidelines Avoid eating more than 2,300 mg (milligrams) of salt (sodium) a day. If you have hypertension, you may need to reduce your sodium intake to 1,500 mg a day. Limit alcohol intake to no more than 1 drink a day for nonpregnant women and 2 drinks a day for men. One drink equals 12 oz of beer, 5 oz of wine, or 1 oz of hard liquor. Work with your health care  provider to maintain a healthy body weight or to lose weight. Ask what an ideal weight is for you. Get at least 30 minutes of exercise that causes your heart to beat faster (aerobic exercise) most days of the week. Activities may include walking, swimming, or biking. Work with your health care provider or diet and nutrition specialist (dietitian) to adjust your eating plan to your individual calorie needs. Reading food labels  Check food labels for the amount of sodium per serving. Choose foods with less than 5 percent of the Daily Value of sodium. Generally, foods with less than 300 mg of sodium per serving fit into this eating plan. To find whole grains, look for the word "whole" as the first word in the ingredient list. Shopping Buy products labeled as "low-sodium" or "no salt added." Buy fresh foods. Avoid canned foods and premade or frozen meals. Cooking Avoid adding salt when cooking. Use salt-free seasonings or herbs instead of table salt or sea salt. Check with your health care provider or pharmacist before using salt substitutes. Do not fry foods. Cook foods using healthy methods such as baking, boiling, grilling, and broiling instead. Cook with heart-healthy oils, such as olive, canola, soybean, or sunflower oil. Meal planning Eat a balanced diet that includes: 5 or more servings of fruits and vegetables each day. At each meal, try to fill half of your plate with fruits and vegetables. Up to 6-8 servings of whole grains each day. Less than 6 oz of lean meat, poultry, or fish each day. A 3-oz serving of meat  is about the same size as a deck of cards. One egg equals 1 oz. 2 servings of low-fat dairy each day. A serving of nuts, seeds, or beans 5 times each week. Heart-healthy fats. Healthy fats called Omega-3 fatty acids are found in foods such as flaxseeds and coldwater fish, like sardines, salmon, and mackerel. Limit how much you eat of the following: Canned or prepackaged  foods. Food that is high in trans fat, such as fried foods. Food that is high in saturated fat, such as fatty meat. Sweets, desserts, sugary drinks, and other foods with added sugar. Full-fat dairy products. Do not salt foods before eating. Try to eat at least 2 vegetarian meals each week. Eat more home-cooked food and less restaurant, buffet, and fast food. When eating at a restaurant, ask that your food be prepared with less salt or no salt, if possible. What foods are recommended? The items listed may not be a complete list. Talk with your dietitian about what dietary choices are best for you. Grains Whole-grain or whole-wheat bread. Whole-grain or whole-wheat pasta. Brown rice. [place]. Bulgur. Whole-grain and low-sodium cereals. Pita bread. Low-fat, low-sodium crackers. Whole-wheat flour tortillas. Vegetables Fresh or frozen vegetables (raw, steamed, roasted, or grilled). Low-sodium or reduced-sodium tomato and vegetable juice. Low-sodium or reduced-sodium tomato sauce and tomato paste. Low-sodium or reduced-sodium canned vegetables. Fruits All fresh, dried, or frozen fruit. Canned fruit in natural juice (without added sugar). Meat and other protein foods Skinless chicken or [place]. Ground chicken or [place]. Pork with fat trimmed off. Fish and seafood. Egg whites. Dried beans, peas, or lentils. Unsalted nuts, nut butters, and seeds. Unsalted canned beans. Lean cuts of beef with fat trimmed off. Low-sodium, lean deli meat. Dairy Low-fat (1%) or fat-free (skim) milk. Fat-free, low-fat, or reduced-fat cheeses. Nonfat, low-sodium ricotta or cottage cheese. Low-fat or nonfat yogurt. Low-fat, low-sodium cheese. Fats and oils Soft margarine without trans fats. Vegetable oil. Low-fat, reduced-fat, or light mayonnaise and salad dressings (reduced-sodium). Canola, safflower, olive, soybean, and sunflower oils. Avocado. Seasoning and other foods Herbs. Spices. Seasoning mixes without salt.  Unsalted popcorn and pretzels. Fat-free sweets. What foods are not recommended? The items listed may not be a complete list. Talk with your dietitian about what dietary choices are best for you. Grains Baked goods made with fat, such as croissants, muffins, or some breads. Dry pasta or rice meal packs. Vegetables Creamed or fried vegetables. Vegetables in a cheese sauce. Regular canned vegetables (not low-sodium or reduced-sodium). Regular canned tomato sauce and paste (not low-sodium or reduced-sodium). Regular tomato and vegetable juice (not low-sodium or reduced-sodium). Angie Fava. Olives. Fruits Canned fruit in a light or heavy syrup. Fried fruit. Fruit in cream or butter sauce. Meat and other protein foods Fatty cuts of meat. Ribs. Fried meat. Berniece Salines. Sausage. Bologna and other processed lunch meats. Salami. Fatback. Hotdogs. Bratwurst. Salted nuts and seeds. Canned beans with added salt. Canned or smoked fish. Whole eggs or egg yolks. Chicken or [place] with skin. Dairy Whole or 2% milk, cream, and half-and-half. Whole or full-fat cream cheese. Whole-fat or sweetened yogurt. Full-fat cheese. Nondairy creamers. Whipped toppings. Processed cheese and cheese spreads. Fats and oils Butter. Stick margarine. Lard. Shortening. Ghee. Bacon fat. Tropical oils, such as coconut, palm kernel, or palm oil. Seasoning and other foods Salted popcorn and pretzels. Onion salt, garlic salt, seasoned salt, table salt, and sea salt. Worcestershire sauce. Tartar sauce. Barbecue sauce. Teriyaki sauce. Soy sauce, including reduced-sodium. Steak sauce. Canned and packaged gravies. Fish sauce. [employer]  sauce. Cocktail sauce. Horseradish that you find on the shelf. Ketchup. Mustard. Meat flavorings and tenderizers. Bouillon cubes. Hot sauce and Tabasco sauce. Premade or packaged marinades. Premade or packaged taco seasonings. Relishes. Regular salad dressings. Where to find more information: National Heart, Lung, and [place]: https://wilson-eaton.com/ American Heart Association: www.heart.org Summary The DASH eating plan is a healthy eating plan that has been shown to reduce high blood pressure (hypertension). It may also reduce your risk for type 2 diabetes, heart disease, and stroke. With the DASH eating plan, you should limit salt (sodium) intake to 2,300 mg a day. If you have hypertension, you may need to reduce your sodium intake to 1,500 mg a day. When on the DASH eating plan, aim to eat more fresh fruits and vegetables, whole grains, lean proteins, low-fat dairy, and heart-healthy fats. Work with your health care provider or diet and nutrition specialist (dietitian) to adjust your eating plan to your individual calorie needs. This information is not intended to replace advice given to you by your health care provider. Make sure you discuss any questions you have with your health care provider. Document Released: 01/10/2011 Document Revised: 01/03/2017 Document Reviewed: 01/15/2016 Elsevier Patient Education  2020 [employer].

## 2022-01-23 NOTE — Assessment & Plan Note (Signed)
Patient has a history of recurrent strokes.  She also reported having bilateral carotid stenosis that was severe and she has not seen vascular.  Repeat carotid Dopplers.  She is currently not on any antiplatelets.  She has a history of allergy to aspirin.  Clopidogrel is currently on hold for some upcoming dental work.  Upon completion of her dental work she needs to start back on 75 mg daily.  Blood pressure control as above.  Referred to vascular pending the results of her carotid Dopplers.

## 2022-01-23 NOTE — Assessment & Plan Note (Signed)
She has had multiple strokes.  LDL is 70 on max dose statin and Zetia.  She should be less than 55.  Will ask Pharm.D. to help her get on PCSK9 inhibitor at follow-up.

## 2022-01-23 NOTE — Assessment & Plan Note (Signed)
[name] reports a history of a pheochromocytoma status post adrenalectomy.  She notes that her blood pressure has been more difficult to control lately.  Check catecholamines and metanephrines.

## 2022-01-23 NOTE — Research (Signed)
  Subject Name: Lori [name]  [facility] met inclusion and exclusion criteria for the Virtual Care and Social Determinant Interventions for the management of hypertension trial.  The informed consent form, study requirements and expectations were reviewed with the subject by Dr. Oval Linsey and myself. The subject was given the opportunity to read the consent and ask questions. The subject verbalized understanding of the trial requirements.  All questions were addressed prior to the signing of the consent form. The subject agreed to participate in the trial and signed the informed consent. The informed consent was obtained prior to performance of any protocol-specific procedures for the subject.  A copy of the signed informed consent was given to the subject and a copy was placed in the subject's medical record.  [employer] was randomized to Group 1.

## 2022-01-23 NOTE — Progress Notes (Signed)
Provider: Marlowe Sax FNP-C  Valinda Fedie, Nelda Bucks, NP  Patient Care Team: Delancey Moraes, Nelda Bucks, NP as PCP - General (Family Medicine) Early Osmond, MD as PCP - Cardiology (Cardiology)  Extended Emergency Contact Information Primary Emergency Contact: [name],[name] Mobile Phone: [phone] Relation: Spouse Preferred language: English Interpreter needed? No Secondary Emergency Contact: Brier Phone: [phone] Mobile Phone: [phone] Relation: Daughter Mother: Gayla Doss Phone: [phone]  Code Status:  Full Code  Goals of care: Advanced Directive information    01/15/2022    1:25 PM  Advanced Directives  Does Patient Have a Medical Advance Directive? No  Would patient like information on creating a medical advance directive? No - Patient declined     Chief Complaint  Patient presents with   Medical Management of Chronic Issues    Requesting for Covid Test. 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  Patient states that has a cough for two and want to be safe.  Patient states that she been headaches really bad.    HPI:  Pt is a 51 y.o. female seen today for an acute visit for Covid Test. 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  She complains of cough x 2 days.cough is non-productive.she would like to be tested for COVID-19 to be on the safe. Also complains of headache that have been really bad.Has history of migraine.On Topamax.Has not tried Tylenol. She denies any fever,chills,fatigue,body aches,runny nose,chest tightness,chest pain,palpitation or shortness of breath. Patient in a hurry states has to go for another appointment upon getting in today.    Past Medical History:  Diagnosis Date   CAD (coronary artery disease)    Depression    Diabetes mellitus without  complication ([place])    History of CT scan    History of mammogram    History of MRI    Hypertension    Pheochromocytoma    Pheochromocytoma    Stroke ([place])    Thyroid disease    Past Surgical History:  Procedure Laterality Date   ABDOMINAL HYSTERECTOMY     CESAREAN SECTION     3   CORONARY ARTERY BYPASS GRAFT     Right adrenal gland removal for pheochromocytoma Right     Allergies  Allergen Reactions   Contrast Media [Iodinated Contrast Media] Anaphylaxis and Swelling   Sumatriptan Anaphylaxis   Morphine Swelling   Penicillins Swelling    Mouth swells up and eyes swollen shut    Outpatient Encounter Medications as of 01/23/2022  Medication Sig   guaiFENesin (MUCINEX) 600 MG 12 hr tablet Take 1 tablet (600 mg total) by mouth 2 (two) times daily.   polyethylene glycol powder (GLYCOLAX/MIRALAX) 17 GM/SCOOP powder Take 17 g by mouth daily. Hold for loose stool   albuterol (PROVENTIL) (2.5 MG/3ML) 0.083% nebulizer solution Take 3 mLs (2.5 mg total) by nebulization every 6 (six) hours as needed for wheezing or shortness of breath.   amLODipine (NORVASC) 10 MG tablet Take 1 tablet (10 mg total) by mouth daily.   atorvastatin (LIPITOR) 80 MG tablet Take 80 mg by mouth daily.   blood glucose meter kit and supplies KIT Dispense based on patient and insurance preference. Use up to four times daily as directed.   budesonide (PULMICORT) 0.25 MG/2ML nebulizer solution Take 2 mLs (0.25 mg total) by nebulization 2 (two) times daily.   Continuous Blood  Gluc Receiver (DEXCOM G5 RECEIVER KIT) DEVI 1 Device by Does not apply route 4 (four) times daily.   cyclobenzaprine (FLEXERIL) 5 MG tablet TAKE 1 TABLET(5 MG) BY MOUTH TWICE DAILY FOR UP TO 7 DAYS AS NEEDED FOR MUSCLE SPASMS   diclofenac Sodium (VOLTAREN) 1 % GEL Apply 4 g topically 4 (four) times daily.   diphenhydrAMINE (BENADRYL) 25 MG tablet Take 25 mg by mouth at bedtime.   escitalopram (LEXAPRO) 20 MG tablet TAKE 1 TABLET BY MOUTH EVERY  MORNING   ezetimibe (ZETIA) 10 MG tablet Take 1 tablet (10 mg total) by mouth daily.   gabapentin (NEURONTIN) 100 MG capsule TAKE ONE CAPSULE BY MOUTH THREE TIMES DAILY FOR NEUROPATHY   hydrochlorothiazide (HYDRODIURIL) 12.5 MG tablet Take 1 tablet (12.5 mg total) by mouth daily.   hydrOXYzine (ATARAX) 25 MG tablet Take 1 tablet (25 mg total) by mouth at bedtime as needed for anxiety.   insulin aspart (NOVOLOG) 100 UNIT/ML FlexPen Inject 7 Units into the skin 3 (three) times daily with meals.   insulin glargine (LANTUS) 100 UNIT/ML Solostar Pen Inject 38 Units into the skin at bedtime.   Insulin Pen Needle 32G X 4 MM MISC Use to inject insulin 4 times daily as directed.   ipratropium-albuterol (DUONEB) 0.5-2.5 (3) MG/3ML SOLN Take 3 mLs by nebulization 2 (two) times daily.   LORazepam (ATIVAN) 0.5 MG tablet Take 1 tablet (0.5 mg total) by mouth at bedtime.   MELATONIN GUMMIES PO Take 9 mg by mouth at bedtime as needed.   methimazole (TAPAZOLE) 5 MG tablet TAKE 1 TABLET BY MOUTH EVERY MORNING   metoCLOPramide (REGLAN) 10 MG/10ML SOLN Take 5 mLs (5 mg total) by mouth 4 (four) times daily -  before meals and at bedtime for 14 days.   metoprolol tartrate (LOPRESSOR) 100 MG tablet TAKE 1 TABLET BY MOUTH TWICE DAILY (BREAKFAST, BEDTIME)   nystatin cream (MYCOSTATIN) Apply 1 Application topically 2 (two) times daily. Affected areas on breast fold and groin and sacral areas   pantoprazole (PROTONIX) 40 MG tablet Take 1 tablet (40 mg total) by mouth every morning.   senna (SENOKOT) 8.6 MG tablet Take 8.6 mg by mouth as needed for constipation.   tamsulosin (FLOMAX) 0.4 MG CAPS capsule TAKE 1 CAPSULE BY MOUTH AT BEDTIME   topiramate (TOPAMAX) 25 MG tablet TAKE 1 TABLET BY MOUTH TWICE DAILY FOR NEUROPATHY   zinc oxide (BALMEX) 11.3 % CREA cream Apply 1 Application topically 2 (two) times daily.   No facility-administered encounter medications on file as of 01/23/2022.    Review of Systems   Constitutional:  Negative for appetite change, chills, fatigue, fever and unexpected weight change.  HENT:  Negative for congestion, ear discharge, ear pain, postnasal drip, rhinorrhea, sinus pressure, sinus pain, sneezing and sore throat.   Eyes:  Negative for pain, discharge, redness, itching and visual disturbance.  Respiratory:  Positive for cough. Negative for chest tightness, shortness of breath and wheezing.   Cardiovascular:  Negative for chest pain, palpitations and leg swelling.  Gastrointestinal:  Positive for constipation. Negative for abdominal distention, abdominal pain, nausea and vomiting.  Musculoskeletal:  Positive for gait problem. Negative for arthralgias, back pain, joint swelling, myalgias, neck pain and neck stiffness.  Skin:  Negative for color change, pallor and rash.  Neurological:  Positive for headaches. Negative for dizziness and light-headedness.     There is no immunization history on file for this patient. Pertinent  Health Maintenance Due  Topic Date Due  FOOT EXAM  Never done   OPHTHALMOLOGY EXAM  Never done   COLONOSCOPY (Pts 45-4yr Insurance coverage will need to be confirmed)  Never done   INFLUENZA VACCINE  Never done   HEMOGLOBIN A1C  04/23/2022   PAP SMEAR-Modifier  Discontinued      11/07/2021   11:00 PM 11/22/2021    1:36 PM 12/20/2021    1:46 PM 01/15/2022    1:25 PM 01/23/2022    1:54 PM  Fall Risk  Falls in the past year?  _0 0  Was there an injury with Fall?  _1 0  Fall Risk Category Calculator  _2 0  Fall Risk Category  High High High Low  Patient Fall Risk Level Low fall risk High fall risk High fall risk Moderate fall risk Moderate fall risk  Patient at Risk for Falls Due to  History of fall(s);Impaired balance/gait;Impaired mobility History of fall(s);Impaired balance/gait;Impaired mobility History of fall(s);Impaired balance/gait;Impaired mobility History of fall(s);Impaired balance/gait;Impaired mobility  Fall risk  Follow up  Falls evaluation completed;Education provided;Falls prevention discussed Falls evaluation completed;Education provided;Falls prevention discussed Falls evaluation completed;Education provided;Falls prevention discussed Falls evaluation completed   Functional Status Survey:    Vitals:   01/23/22 1342  BP: 127/88  Pulse: 97  Resp: 18  Temp: (!) 96.9 F (36.1 C)  SpO2: 99%  Weight: 190 lb 3.2 oz (86.3 kg)  Height: _3  (1.575 m)   Body mass index is 34.79 kg/m. Physical Exam Vitals reviewed.  Constitutional:      General: She is not in acute distress.    Appearance: Normal appearance. She is obese. She is not ill-appearing or diaphoretic.  HENT:     Head: Normocephalic.     Nose: Nose normal. No congestion or rhinorrhea.     Mouth/Throat:     Mouth: Mucous membranes are moist.     Pharynx: Oropharynx is clear. No oropharyngeal exudate or posterior oropharyngeal erythema.  Eyes:     General: No scleral icterus.       Right eye: No discharge.        Left eye: No discharge.     Conjunctiva/sclera: Conjunctivae normal.     Pupils: Pupils are equal, round, and reactive to light.  Neck:     Vascular: No carotid bruit.  Cardiovascular:     Rate and Rhythm: Normal rate and regular rhythm.     Pulses: Normal pulses.     Heart sounds: Normal heart sounds. No murmur heard.    No friction rub. No gallop.  Pulmonary:     Effort: Pulmonary effort is normal. No respiratory distress.     Breath sounds: Normal breath sounds. No wheezing, rhonchi or rales.  Chest:     Chest wall: No tenderness.  Abdominal:     General: Bowel sounds are normal. There is no distension.     Palpations: Abdomen is soft. There is no mass.     Tenderness: There is no abdominal tenderness. There is no right CVA tenderness, left CVA tenderness or guarding.  Musculoskeletal:        General: No swelling or tenderness. Normal range of motion.     Cervical back: Normal range of motion. No rigidity or  tenderness.     Right lower leg: No edema.     Left lower leg: No edema.  Lymphadenopathy:     Cervical: No cervical adenopathy.  Skin:    General: Skin is warm and dry.  Coloration: Skin is not pale.     Findings: No bruising, erythema, lesion or rash.  Neurological:     Mental Status: She is alert and oriented to person, place, and time.     Gait: Gait abnormal.  Psychiatric:        Mood and Affect: Mood normal.        Speech: Speech normal.        Behavior: Behavior normal.    Labs reviewed: Recent Labs    02/21/21 0407 02/22/21 0306 11/03/21 0811 11/04/21 0317 11/05/21 0259 11/06/21 0307  NA 134*   < >  --  137 138 139  K 3.5   < >  --  3.9 3.3* 3.5  CL 99   < >  --  112* 112* 112*  CO2 27   < >  --  18* 20* 21*  GLUCOSE 215*   < >  --  233* 113* 93  BUN 7   < >  --  _0 CREATININE 0.59   < >  --  0.64 0.69 0.55  CALCIUM 8.6*   < >  --  8.0* 8.3* 8.4*  MG 1.7   < > 1.7 2.2 2.1  --   PHOS 4.3  --   --   --   --   --    < > = values in this interval not displayed.   Recent Labs    11/02/21 0146 11/02/21 2329 11/03/21 0451  AST 12* 11* 15  ALT _1 ALKPHOS 77 77 77  BILITOT 0.5 1.0 1.0  PROT 6.9 7.5 7.2  ALBUMIN 3.3* 3.6 3.4*   Recent Labs    11/04/21 0317 11/05/21 0259 11/06/21 0307  WBC 16.8* 11.7* 8.6  NEUTROABS 15.0* 7.9* 5.0  HGB 11.8* 12.5 13.0  HCT 37.9 38.3 39.1  MCV 82.0 78.6* 77.4*  PLT 292 318 287   Lab Results  Component Value Date   TSH 1.399 11/02/2021   Lab Results  Component Value Date   HGBA1C 10.5 (H) 10/23/2021   Lab Results  Component Value Date   CHOL 142 10/23/2021   HDL 51 10/23/2021   LDLCALC 70 10/23/2021   TRIG 127 10/23/2021   CHOLHDL 2.8 10/23/2021    Significant Diagnostic Results in last 30 days:  No results found.  Assessment/Plan 1. Acute cough Afebrile Bilat.lungs clear to Auscultation - POC Influenza A/B results negative  - POC COVID-19 results negative  - guaiFENesin (MUCINEX)  600 MG 12 hr tablet; Take 1 tablet (600 mg total) by mouth 2 (two) times daily.  Dispense: 30 tablet; Refill: 0  2. Slow transit constipation Has had no BM x 3 days  - start on miralax  - encouraged to increase fluids  - polyethylene glycol powder (GLYCOLAX/MIRALAX) 17 GM/SCOOP powder; Take 17 g by mouth daily. Hold for loose stool  Dispense: 3350 g; Refill: 1  Family/ staff Communication: Reviewed plan of care with patient verbalized understanding   Labs/tests ordered:   - POC Influenza A/B  - POC COVID-19   Next Appointment: Return if symptoms worsen or fail to improve.   [name], NP

## 2022-01-23 NOTE — Progress Notes (Signed)
Advanced Hypertension Clinic Initial Assessment:    Date:  01/23/2022   ID:  Lori [name], DOB 03-26-1970, MRN [id number]  PCP:  [name], NP  Cardiologist:  Early Osmond, MD  Nephrologist:  Referring MD: [name], NP   CC: Hypertension  History of Present Illness:    Lori [name] is a 51 y.o. female with a hx of CAD s/p CABG, aortic atherosclerosis, carotid stenosis, hypertension, recurrent stroke, hyperlipidemia, type 2 diabetes, thyroid disease, pheochromocytoma, and depression, here to establish care in the Advanced Hypertension Clinic. She had an Echo 01/21/2021 which revealed LVEF 60-65%, moderate LVH, and grade 1 diastolic dysfunction. Aortic valve sclerosis was present, with no evidence of stenosis.   On 06/25/2021 she was seen by Dr. Ali Lowe and her blood pressure was 120/78 on lisinopril, Norvasc, and Lopresssor. It was felt most of her cerebral pathology was due to lacunar infarctions from uncontrolled hypertension. Her cholesterol was not at goal (LDL >55) on atorvastatin 80 mg and Zetia. She was switched to Crestor 40 mg and continued Zetia. Also started on Jardiance 10 mg daily.   She saw her PCP 01/15/2022 and her blood pressure was 150/80. At the time she had a recent home health nurse visit with elevated blood pressure, although she had not taken any antihypertensives. It was noted that she had been out of HCTZ for several days, so this was restarted. The next day her blood pressure was 188/112 per call from [facility]. The Lori [name] was reportedly taking her medication and complained of dizziness and headache. It was recommended to start clonidine 0.1 mg tablet every 8 hours as needed. She was referred to the Advanced Hypertension Clinic for further evaluation.  Today, she is accompanied by her husband. She confirms having hypertension since she was 51 yo. 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 At that time her heart was racing constantly. Typically her blood pressure is monitored by a home health nurse who visits once a week. Her blood pressures have been elevated. Yesterday it was 180/120. In clinic today her blood pressure is elevated at 148/108, and 166/112 on recheck. She has taken her antihypertensives today. She denies any low blood pressures. Lately she is also suffering from migraines which she believes is related to her higher blood pressures. Additionally she complains of shortness of breath, and swelling localized to her ankle. She has been on HCTZ for 3 days. Two times a week she is in physical therapy, during which she generally feels well. She had PT 2 days ago, and experienced headaches when standing up and walking. About 2 years ago, she reports having carotid dopplers that revealed 70% stenosis. At the time she was having multiple strokes, which prevented any corrective surgery regarding her carotids. She endorses a total history of 22 strokes and 1 heart attack. Usually they order out for their meals. She doesn't use extra salt. At night she drinks 1 cup of coffee to help her sleep. Her husband reports that she frequently enjoys sodas or sweet tea. For pain management she uses Ibuprofen sometimes.  No alcohol consumption. She has never smoked. Her husband states she doesn't snore. A couple years ago she had a sleep study that was positive for sleep apnea. When she wakes up she still feels drowsy, and she confirms daytime somnolence. She denies any chest pain, lightheadedness, syncope, or PND.  Previous antihypertensives: Lisinopril  Past Medical History:  Diagnosis  Date   CAD (coronary artery disease)    Depression    Diabetes mellitus without complication ([place])    History of CT scan    History of mammogram    History of MRI    Hypertension    Pheochromocytoma    Pheochromocytoma    Stroke [facility])    Thyroid disease     Past Surgical History:   Procedure Laterality Date   ABDOMINAL HYSTERECTOMY     CESAREAN SECTION     3   CORONARY ARTERY BYPASS GRAFT     Right adrenal gland removal for pheochromocytoma Right     Current Medications: Current Meds  Medication Sig   albuterol (PROVENTIL) (2.5 MG/3ML) 0.083% nebulizer solution Take 3 mLs (2.5 mg total) by nebulization every 6 (six) hours as needed for wheezing or shortness of breath.   amLODipine (NORVASC) 10 MG tablet Take 1 tablet (10 mg total) by mouth daily.   atorvastatin (LIPITOR) 80 MG tablet Take 80 mg by mouth daily.   blood glucose meter kit and supplies KIT Dispense based on Lori [name] and insurance preference. Use up to four times daily as directed.   budesonide (PULMICORT) 0.25 MG/2ML nebulizer solution Take 2 mLs (0.25 mg total) by nebulization 2 (two) times daily.   clopidogrel (PLAVIX) 75 MG tablet Take 75 mg by mouth daily.   Continuous Blood Gluc Receiver (DEXCOM G5 RECEIVER KIT) DEVI 1 Device by Does not apply route 4 (four) times daily.   cyclobenzaprine (FLEXERIL) 5 MG tablet TAKE 1 TABLET(5 MG) BY MOUTH TWICE DAILY FOR UP TO 7 DAYS AS NEEDED FOR MUSCLE SPASMS   diclofenac Sodium (VOLTAREN) 1 % GEL Apply 4 g topically 4 (four) times daily.   diphenhydrAMINE (BENADRYL) 25 MG tablet Take 25 mg by mouth at bedtime.   escitalopram (LEXAPRO) 20 MG tablet TAKE 1 TABLET BY MOUTH EVERY MORNING   ezetimibe (ZETIA) 10 MG tablet Take 1 tablet (10 mg total) by mouth daily.   gabapentin (NEURONTIN) 100 MG capsule TAKE ONE CAPSULE BY MOUTH THREE TIMES DAILY FOR NEUROPATHY   guaiFENesin (MUCINEX) 600 MG 12 hr tablet Take 1 tablet (600 mg total) by mouth 2 (two) times daily.   hydrOXYzine (ATARAX) 25 MG tablet Take 1 tablet (25 mg total) by mouth at bedtime as needed for anxiety.   insulin aspart (NOVOLOG) 100 UNIT/ML FlexPen Inject 7 Units into the skin 3 (three) times daily with meals.   insulin glargine (LANTUS) 100 UNIT/ML Solostar Pen Inject 38 Units into the skin at  bedtime.   Insulin Pen Needle 32G X 4 MM MISC Use to inject insulin 4 times daily as directed.   ipratropium-albuterol (DUONEB) 0.5-2.5 (3) MG/3ML SOLN Take 3 mLs by nebulization 2 (two) times daily.   LORazepam (ATIVAN) 0.5 MG tablet Take 1 tablet (0.5 mg total) by mouth at bedtime.   MELATONIN GUMMIES PO Take 9 mg by mouth at bedtime as needed.   methimazole (TAPAZOLE) 5 MG tablet TAKE 1 TABLET BY MOUTH EVERY MORNING   metoprolol tartrate (LOPRESSOR) 100 MG tablet TAKE 1 TABLET BY MOUTH TWICE DAILY (BREAKFAST, BEDTIME)   nystatin cream (MYCOSTATIN) Apply 1 Application topically 2 (two) times daily. Affected areas on breast fold and groin and sacral areas   olmesartan-hydrochlorothiazide (BENICAR HCT) 40-25 MG tablet Take 1 tablet by mouth daily.   pantoprazole (PROTONIX) 40 MG tablet Take 1 tablet (40 mg total) by mouth every morning.   polyethylene glycol powder (GLYCOLAX/MIRALAX) 17 GM/SCOOP powder Take 17 g by mouth  daily. Hold for loose stool   senna (SENOKOT) 8.6 MG tablet Take 8.6 mg by mouth as needed for constipation.   tamsulosin (FLOMAX) 0.4 MG CAPS capsule TAKE 1 CAPSULE BY MOUTH AT BEDTIME   topiramate (TOPAMAX) 25 MG tablet TAKE 1 TABLET BY MOUTH TWICE DAILY FOR NEUROPATHY   zinc oxide (BALMEX) 11.3 % CREA cream Apply 1 Application topically 2 (two) times daily.   [DISCONTINUED] hydrochlorothiazide (HYDRODIURIL) 12.5 MG tablet Take 1 tablet (12.5 mg total) by mouth daily.     Allergies:   Contrast media [iodinated contrast media], Sumatriptan, Aspirin, Morphine, and Penicillins   Social History   Socioeconomic History   Marital status: Married    Spouse name: Not on file   Number of children: Not on file   Years of education: Not on file   Highest education level: Not on file  Occupational History   Not on file  Tobacco Use   Smoking status: Never   Smokeless tobacco: Never  Vaping Use   Vaping Use: Never used  Substance and Sexual Activity   Alcohol use: Not  Currently   Drug use: Never   Sexual activity: Not on file  Other Topics Concern   Not on file  Social History Narrative   Tobacco use, amount per day now: Never   Past tobacco use, amount per day: Never   How many years did you use tobacco: Never   Alcohol use (drinks per week): Not Currently.   Diet: Eat out a lot.    Do you drink/eat things with caffeine: Sweet Tea   Marital status:   Married                               What year were you married? 2000   Do you live in a house, apartment, assisted living, condo, trailer, etc.? House   Is it one or more stories? 1   How many persons live in your home? 2   Do you have pets in your home?( please list) No   Highest Level of education completed? Bachelors Degree.   Current or past profession: Location manager, [employer].   Do you exercise?   No                               Type and how often?   Do you have a living will? No   Do you have a DNR form?       No                            If not, do you want to discuss one?   Do you have signed POA/HPOA forms?   No                     If so, please bring to you appointment      Do you have any difficulty bathing or dressing yourself? Yes   Do you have any difficulty preparing food or eating? Yes   Do you have any difficulty managing your medications? Yes   Do you have any difficulty managing your finances? No   Do you have any difficulty affording your medications? Yes   Social Determinants of Health   Financial Resource Strain: Not on file  Food Insecurity: Not on  file  Transportation Needs: Not on file  Physical Activity: Not on file  Stress: Not on file  Social Connections: Not on file     Family History: The Lori [name]'s family history includes Asthma in her daughter; Atrial fibrillation in her maternal uncle and mother; Brain cancer in her brother; Dementia in her father; Diabetes in her brother and mother; Heart attack in her father;  Hypertension in her father and mother; Sickle cell anemia in her son.  ROS:   Please see the history of present illness.    (+) Migraines, headaches (+) Shortness of breath (+) Ankle edema (+) Daytime somnolence All other systems reviewed and are negative.  EKGs/Labs/Other Studies Reviewed:    CT Chest  11/02/2021: IMPRESSION: 1. Mild motion artifact. Mild bilateral lower lobe pulmonary opacity more resembles atelectasis than infection. No pleural effusion. 2. Aortic Atherosclerosis (ICD10-I70.0). Prior sternotomy, possibly CABG.   Echo  01/21/2021: Sonographer Comments: Technically challenging study due to limited  acoustic windows, suboptimal subcostal window and suboptimal apical  window. Image acquisition challenging due to Lori [name] body habitus.    IMPRESSIONS   1. Left ventricular ejection fraction, by estimation, is 60 to 65%. The  left ventricle has normal function. The left ventricle has no regional  wall motion abnormalities. There is moderate left ventricular hypertrophy.  Left ventricular diastolic  parameters are consistent with Grade I diastolic dysfunction (impaired  relaxation).   2. Right ventricular systolic function is normal. The right ventricular  size is normal.   3. The mitral valve is normal in structure. No evidence of mitral valve  regurgitation. No evidence of mitral stenosis.   4. Tricuspid valve regurgitation is mild to moderate.   5. The aortic valve is normal in structure. Aortic valve regurgitation is  not visualized. Aortic valve sclerosis is present, with no evidence of  aortic valve stenosis.   6. The inferior vena cava is normal in size with greater than 50%  respiratory variability, suggesting right atrial pressure of 3 mmHg.    EKG:  EKG is personally reviewed. 01/23/2022: Sinus tachycardia. Rate 103 bpm.  Recent Labs: 11/02/2021: TSH 1.399 11/03/2021: ALT 12 11/05/2021: Magnesium 2.1 11/06/2021: BUN 19; Creatinine, Ser 0.55;  Hemoglobin 13.0; Platelets 287; Potassium 3.5; Sodium 139   Recent Lipid Panel    Component Value Date/Time   CHOL 142 10/23/2021 1137   TRIG 127 10/23/2021 1137   HDL 51 10/23/2021 1137   CHOLHDL 2.8 10/23/2021 1137   VLDL 16 01/20/2021 0709   LDLCALC 70 10/23/2021 1137    Physical Exam:    VS:  BP (!) 166/112 (BP Location: Right Arm, Lori [name] Position: Sitting, Cuff Size: Large)   Pulse (!) 103   Ht _0  (1.575 m)   Wt 188 lb 14.4 oz (85.7 kg)   BMI 34.55 kg/m  , BMI Body mass index is 34.55 kg/m. GENERAL:  Well appearing; In wheelchair. HEENT: Pupils equal round and reactive, fundi not visualized, oral mucosa unremarkable NECK:  No jugular venous distention, waveform within normal limits, carotid upstroke brisk and symmetric, no bruits, no thyromegaly LUNGS:  Clear to auscultation bilaterally HEART:  RRR.  PMI not displaced or sustained,S1 and S2 within normal limits, no S3, no S4, no clicks, no rubs, no murmurs ABD:  Flat, positive bowel sounds normal in frequency in pitch, no bruits, no rebound, no guarding, no midline pulsatile mass, no hepatomegaly, no splenomegaly EXT:  2 plus pulses throughout, no edema, no cyanosis, no clubbing SKIN:  No rashes, no nodules  NEURO:  Cranial nerves II through XII grossly intact, motor grossly intact throughout PSYCH:  Cognitively intact, oriented to person place and time   ASSESSMENT/PLAN:    Pheochromocytoma Lori [name] reports a history of a pheochromocytoma status post adrenalectomy.  She notes that her blood pressure has been more difficult to control lately.  Check catecholamines and metanephrines.  Essential hypertension Prior adrenalectomy for pheochromocytoma as above.  Blood pressures have been uncontrolled since multiple medications were discontinued at the time of her hospitalization.  Continue amlodipine.  HCTZ was recently restarted.  She was previously on lisinopril.  We will combine HCTZ with olmesartan 40/25 mg  daily.  If this dose works we can ultimately transition her to [employer] and discontinue the standing amlodipine.  We will also check for other secondary causes of hypertension including cortisol, renin and aldosterone.  She has known sleep apnea and has not been using a CPAP.  We will refer her for sleep study.  She is unable to get much exercise due to recurrent stroke.  Continue PT. she is unable to cook for self.  We discussed the importance of trying to limit the sodium in her diet and both she and her husband expressed understanding.  Stroke [facility]) Lori [name] has a history of recurrent strokes.  She also reported having bilateral carotid stenosis that was severe and she has not seen vascular.  Repeat carotid Dopplers.  She is currently not on any antiplatelets.  She has a history of allergy to aspirin.  Clopidogrel is currently on hold for some upcoming dental work.  Upon completion of her dental work she needs to start back on 75 mg daily.  Blood pressure control as above.  Referred to vascular pending the results of her carotid Dopplers.  CVA (cerebral vascular accident) [facility]) She has had multiple strokes.  LDL is 70 on max dose statin and Zetia.  She should be less than 55.  Will ask Pharm.D. to help her get on PCSK9 inhibitor at follow-up.  HLD (hyperlipidemia) LDL goal less than 55 as above.  Continue atorvastatin and Zetia for now.   Screening for Secondary Hypertension:     01/23/2022    3:22 PM  Causes  Drugs/Herbals Screened    Relevant Labs/Studies:    Latest Ref Rng & Units 11/06/2021    3:07 AM 11/05/2021    2:59 AM 11/04/2021    3:17 AM  Basic Labs  Sodium 135 - 145 mmol/L 139  138  137   Potassium 3.5 - 5.1 mmol/L 3.5  3.3  3.9   Creatinine 0.44 - 1.00 mg/dL 0.55  0.69  0.64        Latest Ref Rng & Units 11/02/2021    2:46 AM 10/23/2021   11:37 AM  Thyroid   TSH 0.350 - 4.500 uIU/mL 1.399  0.93      Disposition:    FU with APP/PharmD in 1 month for the next 3 months.    FU with Emeterio Balke C. Oval Linsey, MD, [facility] in 4 months.   Medication Adjustments/Labs and Tests Ordered: Current medicines are reviewed at length with the Lori [name] today.  Concerns regarding medicines are outlined above.   Orders Placed This Encounter  Procedures   Catecholamines, fractionated, plasma   Metanephrines, plasma   Cortisol   Aldosterone + renin activity w/ ratio   Cantril's Ladder Assessment   EKG 12-Lead   Split night study   VAS US CAROTID   VAS US CAROTID   Meds ordered this encounter  Medications   olmesartan-hydrochlorothiazide (BENICAR HCT) 40-25 MG tablet    Sig: Take 1 tablet by mouth daily.    Dispense:  90 tablet    Refill:  3    D/C HCTZ    I,Mathew Stumpf,acting as a scribe for Skeet Latch, MD.,have documented all relevant documentation on the behalf of Skeet Latch, MD,as directed by  Skeet Latch, MD while in the presence of Skeet Latch, MD.  I, [place] Oval Linsey, MD have reviewed all documentation for this visit.  The documentation of the exam, diagnosis, procedures, and orders on 01/23/2022 are all accurate and complete.   Signed, Skeet Latch, MD  01/23/2022 5:33 PM    [place]

## 2022-01-23 NOTE — Assessment & Plan Note (Signed)
LDL goal less than 55 as above.  Continue atorvastatin and Zetia for now.

## 2022-01-23 NOTE — Assessment & Plan Note (Addendum)
Prior adrenalectomy for pheochromocytoma as above.  Blood pressures have been uncontrolled since multiple medications were discontinued at the time of her hospitalization.  Continue amlodipine.  HCTZ was recently restarted.  She was previously on lisinopril.  We will combine HCTZ with olmesartan 40/25 mg daily.  If this dose works we can ultimately transition her to [employer] and discontinue the standing amlodipine.  We will also check for other secondary causes of hypertension including cortisol, renin and aldosterone.  She has known sleep apnea and has not been using a CPAP.  We will refer her for sleep study.  She is unable to get much exercise due to recurrent stroke.  Continue PT. she is unable to cook for self.  We discussed the importance of trying to limit the sodium in her diet and both she and her husband expressed understanding.

## 2022-01-30 ENCOUNTER — Telehealth: Payer: Self-pay

## 2022-01-30 DIAGNOSIS — F419 Anxiety disorder, unspecified: Secondary | ICD-10-CM

## 2022-01-30 MED ORDER — ESCITALOPRAM OXALATE 20 MG PO TABS: 20.0000 mg | ORAL_TABLET | Freq: Every morning | ORAL | 8 refills | Status: DC

## 2022-01-30 NOTE — Telephone Encounter (Signed)
Patient called stating that she needs to be put in a nursing home because she is not being taken care of at home. She states that she is not getting her medications to take.  Message sent to Marlowe Sax, NP

## 2022-01-30 NOTE — Telephone Encounter (Signed)
Patient seen by Cardiologist 01/23/2022. Issues addressed.

## 2022-01-30 NOTE — Telephone Encounter (Signed)
FL 2 was filled out few months ago but patient declined to move to Nursing home. Patient to notify provider when she gets a skilled facility where she will be moving to then will fill out another F L 2 which is usually good for 30 days so need to get the facility.

## 2022-01-30 NOTE — Telephone Encounter (Signed)
Lori [name] with [facility] called ans left a message on clinical intake voicemail stating that prescription for Lexapro was sent to the wrong fax number. Prescription should have been sent to [phone]. Prescription printed and sent to fax number left by [facility].  The contact number to the pharmacy is [phone].  [phone]

## 2022-01-31 NOTE — Telephone Encounter (Signed)
Obtain Fax for the facility to send F L 2 form.

## 2022-01-31 NOTE — Telephone Encounter (Signed)
Patient states that she has found facility named [facility]

## 2022-02-05 NOTE — Telephone Encounter (Signed)
Fax number obtained. FL2 forms filled out an placed on Dinah's desk.

## 2022-02-06 ENCOUNTER — Telehealth: Payer: Self-pay

## 2022-02-06 NOTE — Telephone Encounter (Signed)
[facility] called stating that they are supplying her walker at this time but would need chart notes faxed to [phone] for the hospital bed.

## 2022-02-08 ENCOUNTER — Other Ambulatory Visit: Payer: Self-pay | Admitting: *Deleted

## 2022-02-08 ENCOUNTER — Other Ambulatory Visit: Payer: Self-pay | Admitting: Family

## 2022-02-08 NOTE — Telephone Encounter (Signed)
Kerrie with [facility] [phone] called requesting refill on patient.  Pended Rx and sent to [facility] for approval due to Webb Silversmith out of office.

## 2022-02-10 MED ORDER — INSULIN ASPART 100 UNIT/ML FLEXPEN: 7.0000 [IU] | PEN_INJECTOR | Freq: Three times a day (TID) | SUBCUTANEOUS | 5 refills | Status: DC

## 2022-02-11 ENCOUNTER — Other Ambulatory Visit: Payer: Self-pay

## 2022-02-11 MED ORDER — INSULIN ASPART 100 UNIT/ML FLEXPEN: 7.0000 [IU] | PEN_INJECTOR | Freq: Three times a day (TID) | SUBCUTANEOUS | 5 refills | Status: DC

## 2022-02-11 NOTE — Telephone Encounter (Signed)
Please fax notes for 01/15/2022 visit.

## 2022-02-12 NOTE — Telephone Encounter (Signed)
Notes were faxed

## 2022-02-15 ENCOUNTER — Ambulatory Visit (INDEPENDENT_AMBULATORY_CARE_PROVIDER_SITE_OTHER): Payer: Medicaid Other

## 2022-02-15 ENCOUNTER — Encounter: Payer: Medicaid Other | Attending: Physical Medicine & Rehabilitation | Admitting: Physical Medicine and Rehabilitation

## 2022-02-15 DIAGNOSIS — I10 Essential (primary) hypertension: Secondary | ICD-10-CM

## 2022-02-15 DIAGNOSIS — Z8673 Personal history of transient ischemic attack (TIA), and cerebral infarction without residual deficits: Secondary | ICD-10-CM | POA: Diagnosis not present

## 2022-02-18 ENCOUNTER — Other Ambulatory Visit (HOSPITAL_BASED_OUTPATIENT_CLINIC_OR_DEPARTMENT_OTHER): Payer: Self-pay

## 2022-02-18 DIAGNOSIS — I6523 Occlusion and stenosis of bilateral carotid arteries: Secondary | ICD-10-CM

## 2022-02-19 ENCOUNTER — Ambulatory Visit (HOSPITAL_BASED_OUTPATIENT_CLINIC_OR_DEPARTMENT_OTHER): Payer: Medicaid Other | Attending: Cardiovascular Disease | Admitting: Cardiovascular Disease

## 2022-02-21 ENCOUNTER — Other Ambulatory Visit: Payer: Self-pay

## 2022-02-21 DIAGNOSIS — M62838 Other muscle spasm: Secondary | ICD-10-CM

## 2022-02-21 MED ORDER — CYCLOBENZAPRINE HCL 5 MG PO TABS: ORAL_TABLET | ORAL | 3 refills | Status: DC

## 2022-02-22 NOTE — Progress Notes (Signed)
Cardiology Office Note:    Date:  03/01/2022   ID:  Lori [name], DOB 05-14-1970, MRN [id number]  PCP:  [name], NP   [facility] Providers Cardiologist:  Lenna Sciara, MD Referring MD: [name], NP   Chief Complaint/Reason for Referral: Cardiology follow up  ASSESSMENT:    1. Hx of CABG   2. Type 2 diabetes mellitus with complication, with long-term current use of insulin ([place])   3. Hypertension associated with diabetes ([place])   4. Hyperlipidemia associated with type 2 diabetes mellitus ([place])   5. Cerebrovascular accident (CVA), unspecified mechanism ([place])   6. Aortic atherosclerosis ([facility])      PLAN:    In order of problems listed above: 1.  History of CABG:  Continue Plavix in lieu of aspirin given history of stroke, metoprolol and atorvastatin.   2.  Type 2 diabetes: Continue Plavix in lieu of aspirin, Benicar, atorvastatin; intolerant of Jardiance due to UTI  3.  Hypertension:   Appreciate recommendations from Dr. Oval Linsey and BP is well controlled today. 4.  Hyperlipidemia: Check lipid panel, LFTs, LP(a) today.  Goal LDL is less than 55 given history of stroke. 5.  History of stroke: She had reassuring carotid Dopplers recently.  Continue Plavix, statin, and strict blood pressure control. 6.  Aortic atherosclerosis: Continue Plavix in lieu of aspirin, statin and Zetia as detailed above with goal LDL less than 55.    Dispo:  Return in about 6 months (around 08/30/2022).      Medication Adjustments/Labs and Tests Ordered: Current medicines are reviewed at length with the patient today.  Concerns regarding medicines are outlined above.  The following changes have been made:     Labs/tests ordered: Orders Placed This Encounter  Procedures   Lipoprotein A (LPA)   Lipid panel   Hepatic function panel    Medication Changes: No orders of the defined types were placed in this encounter.    Current medicines are reviewed at length with  the patient today.  The patient does not have concerns regarding medicines.   History of Present Illness:    FOCUSED PROBLEM LIST:   1.  Coronary artery disease status post 3V CABG 2018 [facility], [place], [place]) 2.  Type 2 diabetes on insulin; intolerant of Jardiance due to UTI 4.  Hypertension 5.  Hyperlipidemia 6.  Pheochromocytoma status post resection 7.  History of multiple strokes with last in 2022; CT demonstrated a lacunar and cerebellar infarction and MRI demonstrated multiple remote infarctions with multiple chronic microhemorrhages likely due to hypertensive etiology (no cortical infarctions); residual left-sided weakness; uses wheelchair 8.  Aortic atherosclerosis on CT abdomen pelvis 2022 9.  IV dye allergy  May 2023 consultation: The patient is a 52 y.o. female with the indicated medical history here to establish cardiovascular care.  She unfortunately had a pretty severe stroke last year.  She has had multiple strokes.  She then moved to [place] as this is where she is from.  She has been participating in physical therapy to get stronger.  In terms of other symptoms she denies any recurrent signs or symptoms of stroke, significant shortness of breath, exertional angina, exertional dyspnea, orthopnea, or paroxysmal nocturnal dyspnea.  She has fortunately not required any emergency room visits or hospitalizations recently.  She does not smoke or drink.  Plan: Start Jardiance 10 mg daily, change atorvastatin to Crestor 40 mg daily and continue Zetia; check lipid panel LFTs in approximately 2 months.  Today: In the  interim she was seen by Dr. Oval Linsey for advanced hypertension recommendations and her medications were altered.  She was referred for an assessment of her catecholamine and metanephrine levels she was also referred for carotid Dopplers which showed mild bilateral disease.  The patient is doing well from a cardiovascular standpoint.  She denies any recurrent signs or  symptoms of stroke, chest pain, or severe breathing issues.  She is required no hospitalizations or emergency room visits.  She has been completely compliant with her medications and her only complaint is that there are so many of them.  She is otherwise well without cardiovascular complaints.     Current Medications: Current Meds  Medication Sig   albuterol (PROVENTIL) (2.5 MG/3ML) 0.083% nebulizer solution Take 3 mLs (2.5 mg total) by nebulization every 6 (six) hours as needed for wheezing or shortness of breath.   atorvastatin (LIPITOR) 80 MG tablet Take 80 mg by mouth daily.   blood glucose meter kit and supplies KIT Dispense based on patient and insurance preference. Use up to four times daily as directed.   budesonide (PULMICORT) 0.25 MG/2ML nebulizer solution Take 2 mLs (0.25 mg total) by nebulization 2 (two) times daily.   clopidogrel (PLAVIX) 75 MG tablet Take 75 mg by mouth daily.   Continuous Blood Gluc Receiver (DEXCOM G5 RECEIVER KIT) DEVI 1 Device by Does not apply route 4 (four) times daily.   cyclobenzaprine (FLEXERIL) 5 MG tablet TAKE 1 TABLET(5 MG) BY MOUTH TWICE DAILY FOR UP TO 7 DAYS AS NEEDED FOR MUSCLE SPASMS   diclofenac Sodium (VOLTAREN) 1 % GEL Apply 4 g topically 4 (four) times daily.   diphenhydrAMINE (BENADRYL) 25 MG tablet Take 25 mg by mouth at bedtime.   escitalopram (LEXAPRO) 20 MG tablet Take 1 tablet (20 mg total) by mouth every morning.   ezetimibe (ZETIA) 10 MG tablet Take 1 tablet (10 mg total) by mouth daily.   gabapentin (NEURONTIN) 100 MG capsule TAKE ONE CAPSULE BY MOUTH THREE TIMES DAILY FOR NEUROPATHY   guaiFENesin (MUCINEX) 600 MG 12 hr tablet Take 1 tablet (600 mg total) by mouth 2 (two) times daily.   hydrOXYzine (ATARAX) 25 MG tablet Take 1 tablet (25 mg total) by mouth at bedtime as needed for anxiety.   insulin aspart (NOVOLOG) 100 UNIT/ML FlexPen Inject 7 Units into the skin 3 (three) times daily with meals.   insulin glargine (LANTUS) 100  UNIT/ML Solostar Pen Inject 38 Units into the skin at bedtime.   Insulin Pen Needle 32G X 4 MM MISC Use to inject insulin 4 times daily as directed.   ipratropium-albuterol (DUONEB) 0.5-2.5 (3) MG/3ML SOLN Take 3 mLs by nebulization 2 (two) times daily.   LORazepam (ATIVAN) 0.5 MG tablet Take 1 tablet (0.5 mg total) by mouth at bedtime.   MELATONIN GUMMIES PO Take 9 mg by mouth at bedtime as needed.   methimazole (TAPAZOLE) 5 MG tablet TAKE 1 TABLET BY MOUTH EVERY MORNING   metoprolol tartrate (LOPRESSOR) 100 MG tablet TAKE 1 TABLET BY MOUTH TWICE DAILY (BREAKFAST, BEDTIME)   nystatin cream (MYCOSTATIN) Apply 1 Application topically 2 (two) times daily. Affected areas on breast fold and groin and sacral areas   olmesartan-hydrochlorothiazide (BENICAR HCT) 40-25 MG tablet Take 1 tablet by mouth daily.   pantoprazole (PROTONIX) 40 MG tablet Take 1 tablet (40 mg total) by mouth every morning.   polyethylene glycol powder (GLYCOLAX/MIRALAX) 17 GM/SCOOP powder Take 17 g by mouth daily. Hold for loose stool   senna (SENOKOT) 8.6  MG tablet Take 8.6 mg by mouth as needed for constipation.   tamsulosin (FLOMAX) 0.4 MG CAPS capsule TAKE 1 CAPSULE BY MOUTH AT BEDTIME   topiramate (TOPAMAX) 25 MG tablet TAKE 1 TABLET BY MOUTH TWICE DAILY FOR NEUROPATHY   zinc oxide (BALMEX) 11.3 % CREA cream Apply 1 Application topically 2 (two) times daily.     Allergies:    Contrast media [iodinated contrast media], Sumatriptan, Aspirin, Morphine, and Penicillins   Social History:   Social History   Tobacco Use   Smoking status: Never   Smokeless tobacco: Never  Vaping Use   Vaping Use: Never used  Substance Use Topics   Alcohol use: Not Currently   Drug use: Never     Family Hx: Family History  Problem Relation Age of Onset   Hypertension Mother    Diabetes Mother    Atrial fibrillation Mother    Hypertension Father    Heart attack Father    Dementia Father    Diabetes Brother    Brain cancer  Brother    Atrial fibrillation Maternal Uncle    Asthma Daughter    Sickle cell anemia Son      Review of Systems:   Please see the history of present illness.    All other systems reviewed and are negative.     EKGs/Labs/Other Test Reviewed:    EKG:  EKG performed Jun 11, 2021 that I personally reviewed demonstrates sinus tachycardia with PVCs; EKG today that I personally reviewed demonstrates normal sinus rhythm with nonspecific ST and T wave changes  Prior CV studies:  Carotid Dopplers 2024: Right Carotid: Velocities in the right ICA are consistent with a 1-39%  stenosis. Non-hemodynamically significant plaque <50% noted in the  CCA. Left Carotid: Velocities in the left ICA are consistent with a 1-39%  stenosis. Non-hemodynamically significant plaque <50% noted in the  CCA.  Vertebrals: Bilateral vertebral arteries demonstrate antegrade flow.  Subclavians: Normal flow hemodynamics were seen in bilateral subclavian               arteries.   TTE 2022: 1. Left ventricular ejection fraction, by estimation, is 60 to 65%. The  left ventricle has normal function. The left ventricle has no regional  wall motion abnormalities. There is moderate left ventricular hypertrophy.  Left ventricular diastolic  parameters are consistent with Grade I diastolic dysfunction (impaired  relaxation).   2. Right ventricular systolic function is normal. The right ventricular  size is normal.   3. The mitral valve is normal in structure. No evidence of mitral valve  regurgitation. No evidence of mitral stenosis.   4. Tricuspid valve regurgitation is mild to moderate.   5. The aortic valve is normal in structure. Aortic valve regurgitation is  not visualized. Aortic valve sclerosis is present, with no evidence of  aortic valve stenosis.   6. The inferior vena cava is normal in size with greater than 50%  respiratory variability, suggesting right atrial pressure of 3 mmHg.   Other studies  Reviewed: Review of the additional studies/records demonstrates: Aortic atherosclerosis CT abdomen pelvis 2022  Recent Labs: 11/02/2021: TSH 1.399 11/03/2021: ALT 12 11/05/2021: Magnesium 2.1 11/06/2021: BUN 19; Creatinine, Ser 0.55; Hemoglobin 13.0; Platelets 287; Potassium 3.5; Sodium 139   Recent Lipid Panel Lab Results  Component Value Date/Time   CHOL 142 10/23/2021 11:37 AM   TRIG 127 10/23/2021 11:37 AM   HDL 51 10/23/2021 11:37 AM   LDLCALC 70 10/23/2021 11:37 AM    Risk  Assessment/Calculations:          Physical Exam:    VS:  BP 125/80   Pulse 70   Ht '5\' 2"'$  (1.575 m)   Wt 188 lb 9.6 oz (85.5 kg)   SpO2 97%   BMI 34.50 kg/m          Wt Readings from Last 3 Encounters:  03/01/22 188 lb 9.6 oz (85.5 kg)  01/23/22 188 lb 14.4 oz (85.7 kg)  01/23/22 190 lb 3.2 oz (86.3 kg)    GENERAL:  No apparent distress, AOx3 HEENT:  No carotid bruits, +2 carotid impulses, no scleral icterus CAR: RRR no murmurs, gallops, rubs, or thrills RES:  Clear to auscultation bilaterally ABD:  Soft, nontender, nondistended, positive bowel sounds x 4 VASC:  +2 R radial pulse +1 left radial pulse, +2 carotid pulses, palpable pedal pulses NEURO:  CN 2-12 grossly intact; left-sided weakness PSYCH:  No active depression or anxiety EXT:  No edema, ecchymosis, or cyanosis  Signed, Early Osmond, MD  03/01/2022 8:30 AM    [facility] [place], [place], [place]  [address] Phone: [phone]; Fax: [phone]   Note:  This document was prepared using Dragon voice recognition software and may include unintentional dictation errors.

## 2022-02-25 ENCOUNTER — Telehealth: Payer: Self-pay

## 2022-02-25 NOTE — Telephone Encounter (Signed)
Recommend right shoulder X-ray to evaluate pain post fall.will need office visit to recheck patient's weight.

## 2022-02-25 NOTE — Telephone Encounter (Signed)
Patient was evaluated today by physical therapist (home health), Dorothea Ogle. Dorothea Ogle called to express that patient had a fall yesterday, EMS was called, and patient was evaluated for injuries, in which she has no visible injuries.   Patient did not hit her head however she is having right shoulder pain 4/10, related to the fall. Patient was in the background of the call and stated she is experiencing a loss in appetite and thinks she has lost about 7 pounds in 1 week.   Patient and Dorothea Ogle were informed that message will be shared with Ngetich, Dinah C, NP and I will follow-up with the patient.  Please advise

## 2022-02-26 ENCOUNTER — Ambulatory Visit (INDEPENDENT_AMBULATORY_CARE_PROVIDER_SITE_OTHER): Payer: Medicaid Other | Admitting: Pharmacist Clinician (PhC)/ Clinical Pharmacy Specialist

## 2022-02-26 ENCOUNTER — Encounter (HOSPITAL_BASED_OUTPATIENT_CLINIC_OR_DEPARTMENT_OTHER): Payer: Self-pay | Admitting: Pharmacist Clinician (PhC)/ Clinical Pharmacy Specialist

## 2022-02-26 VITALS — BP 131/93 | HR 73

## 2022-02-26 DIAGNOSIS — I10 Essential (primary) hypertension: Secondary | ICD-10-CM | POA: Diagnosis not present

## 2022-02-26 NOTE — Patient Instructions (Signed)
Follow up appointment: February 22 at 10:30 am  Go to the lab Friday when you see Dr. Ali Lowe  Take your BP meds as follows:  Continue with all your current medications  Bring medication pack to next appointment  Check your blood pressure at home daily (if able) and keep record of the readings.  Hypertension "High blood pressure"  Hypertension is often called "The Silent Killer." It rarely causes symptoms until it is extremely  high or has done damage to other organs in the body. For this reason, you should have your  blood pressure checked regularly by your physician. We will check your blood pressure  every time you see a provider at one of our offices.   Your blood pressure reading consists of two numbers. Ideally, blood pressure should be  below 120/80. The first ("top") number is called the systolic pressure. It measures the  pressure in your arteries as your heart beats. The second ("bottom") number is called the diastolic pressure. It measures the pressure in your arteries as the heart relaxes between beats.  The benefits of getting your blood pressure under control are enormous. A 10-point  reduction in systolic blood pressure can reduce your risk of stroke by 27% and heart failure by 28%  Your blood pressure goal is < 130/80  To check your pressure at home you will need to:  1. Sit up in a chair, with feet flat on the floor and back supported. Do not cross your ankles or legs. 2. Rest your left arm so that the cuff is about heart level. If the cuff goes on your upper arm,  then just relax the arm on the table, arm of the chair or your lap. If you have a wrist cuff, we  suggest relaxing your wrist against your chest (think of it as Pledging the Flag with the  wrong arm).  3. Place the cuff snugly around your arm, about 1 inch above the crook of your elbow. The  cords should be inside the groove of your elbow.  4. Sit quietly, with the cuff in place, for about 5  minutes. After that 5 minutes press the power  button to start a reading. 5. Do not talk or move while the reading is taking place.  6. Record your readings on a sheet of paper. Although most cuffs have a memory, it is often  easier to see a pattern developing when the numbers are all in front of you.  7. You can repeat the reading after 1-3 minutes if it is recommended  Make sure your bladder is empty and you have not had caffeine or tobacco within the last 30 min  Always bring your blood pressure log with you to your appointments. If you have not brought your monitor in to be double checked for accuracy, please bring it to your next appointment.  You can find a list of quality blood pressure cuffs at validatebp.org

## 2022-02-26 NOTE — Progress Notes (Signed)
Office Visit    Patient Name: Lori [name] Date of Encounter: 03/07/2022  Primary Care Provider:  [name], NP Primary Cardiologist:  Early Osmond, MD  Chief Complaint    Hypertension - Advanced hypertension clinic  Past Medical History   CAD s/p CABG in 2018  Recurrent stroke clopidogrel 75 mg daily  HLD atorvastatin 80, ezetimibe 10 mg daily; LDL 70  DM novolog 7 units TID and lantus 38 units at bedtime; A1c 10.5  Aortic atherosclerosis   Carotid stenosis   pheochromocytoma post adrenalectomy  hyperthyroidism methimazole 5 mg daily  Anxiety/depression escitalopram 20 mg, hydroxyzine 25 mg PRN, lorazepam 0.5 mg PRN  neuropathy topiramate 25 mg BID, gabapentin 100 mg TID    Allergies  Allergen Reactions   Contrast Media [Iodinated Contrast Media] Anaphylaxis and Swelling   Sumatriptan Anaphylaxis   Aspirin Swelling   Morphine Swelling   Penicillins Swelling    Mouth swells up and eyes swollen shut    History of Present Illness    Lori [name] is a 52 y.o. female patient who was referred to the [facility]pert[facility] for follow up. She saw Dr. Oval Linsey in December and her BP was 166/112. Amlodipine 10 mg and olmesartan 40 mg were continued, HCTZ was increased to 25 mg daily.  She presents today with her husband and brought her medications from home. She gets her medications shipped to her house in daily dose packs (4 times of day). She mainly takes the morning medications only, due to falling asleep later in the day and nurses only coming in the morning. She is still using the pill packs from December and it is about half empty. Her January medications have not arrived yet and they have changed addresses recently. She also has a few medication bottles that are separate (cyclobenzaprine, guaifenesin, pantoprazole, and olmesartan/HCTZ because it was started after the December packs were sent).  Blood Pressure Goal: 130/80  Current Medications:  amlodipine 10 mg daily, olmesartan/HCTZ 40/25, metoprolol tartrate 100 mg BID   Adherence Assessment  Do you ever forget to take your medication? [x]$ Yes []$ No  Do you ever skip doses due to side effects? []$ Yes []$ No  Do you have trouble affording your medicines? []$ Yes [x]$ No  Are you ever unable to pick up your medication due to transportation difficulties? [x]$ Yes - dose pack mailed, but has wrong address - advised pt to fix ASAP []$ No  Do you ever stop taking your medications because you don't believe they are helping? []$ Yes []$ No  Do you check your weight daily? []$ Yes []$ No   Adherence strategy: pill pack - still missing multiple doses/week  Previously tried: lisinopril  Family Hx: mother with HTN; father with HTN and previous heart attack  Social Hx: Tobacco: none Alcohol: none Caffeine: 1 cup of coffee occasionally  Diet: Used to eat out all the time but starting to cook more at home after recent move, does not add salt to meals, likes all vegetables, eats all meats except pork, snacks on cookies  Exercise: unable to get much exercise due to recurrent stroke  Home BP readings: Did not provide log; usually ranges from 114-130/61-77   Accessory Clinical Findings    Lab Results  Component Value Date   CREATININE 0.55 11/06/2021   BUN 19 11/06/2021   NA 139 11/06/2021   K 3.5 11/06/2021   CL 112 (H) 11/06/2021   CO2 21 (L) 11/06/2021   Lab Results  Component Value Date   ALT 13  03/01/2022   AST 16 03/01/2022   ALKPHOS 117 03/01/2022   BILITOT 0.4 03/01/2022   Lab Results  Component Value Date   HGBA1C 10.5 (H) 10/23/2021    Screening for Secondary Hypertension:      01/23/2022    3:22 PM  Causes  Drugs/Herbals Screened    Relevant Labs/Studies:    Latest Ref Rng & Units 11/06/2021    3:07 AM 11/05/2021    2:59 AM 11/04/2021    3:17 AM  Basic Labs  Sodium 135 - 145 mmol/L 139  138  137   Potassium 3.5 - 5.1 mmol/L 3.5  3.3  3.9   Creatinine 0.44 - 1.00  mg/dL 0.55  0.69  0.64        Latest Ref Rng & Units 11/02/2021    2:46 AM 10/23/2021   11:37 AM  Thyroid   TSH 0.350 - 4.500 uIU/mL 1.399  0.93                   Home Medications    Current Outpatient Medications  Medication Sig Dispense Refill   albuterol (PROVENTIL) (2.5 MG/3ML) 0.083% nebulizer solution Take 3 mLs (2.5 mg total) by nebulization every 6 (six) hours as needed for wheezing or shortness of breath. 75 mL 12   amLODipine (NORVASC) 10 MG tablet Take 1 tablet (10 mg total) by mouth daily. 30 tablet 0   atorvastatin (LIPITOR) 80 MG tablet Take 80 mg by mouth daily.     blood glucose meter kit and supplies KIT Dispense based on patient and insurance preference. Use up to four times daily as directed. 1 each 0   budesonide (PULMICORT) 0.25 MG/2ML nebulizer solution Take 2 mLs (0.25 mg total) by nebulization 2 (two) times daily. 60 mL 12   clopidogrel (PLAVIX) 75 MG tablet Take 75 mg by mouth daily.     Continuous Blood Gluc Receiver (DEXCOM G5 RECEIVER KIT) DEVI 1 Device by Does not apply route 4 (four) times daily. 1 each 5   cyclobenzaprine (FLEXERIL) 5 MG tablet TAKE 1 TABLET(5 MG) BY MOUTH TWICE DAILY FOR UP TO 7 DAYS AS NEEDED FOR MUSCLE SPASMS 30 tablet 3   diclofenac Sodium (VOLTAREN) 1 % GEL Apply 4 g topically 4 (four) times daily. 200 g 0   diphenhydrAMINE (BENADRYL) 25 MG tablet Take 25 mg by mouth at bedtime.     escitalopram (LEXAPRO) 20 MG tablet Take 1 tablet (20 mg total) by mouth every morning. 30 tablet 8   ezetimibe (ZETIA) 10 MG tablet Take 1 tablet (10 mg total) by mouth daily. 90 tablet 1   gabapentin (NEURONTIN) 100 MG capsule TAKE ONE CAPSULE BY MOUTH THREE TIMES DAILY FOR NEUROPATHY 90 capsule 1   guaiFENesin (MUCINEX) 600 MG 12 hr tablet Take 1 tablet (600 mg total) by mouth 2 (two) times daily. 30 tablet 0   hydrOXYzine (ATARAX) 25 MG tablet Take 1 tablet (25 mg total) by mouth at bedtime as needed for anxiety. 30 tablet 0   insulin aspart  (NOVOLOG) 100 UNIT/ML FlexPen Inject 7 Units into the skin 3 (three) times daily with meals. 15 mL 5   insulin glargine (LANTUS) 100 UNIT/ML Solostar Pen Inject 38 Units into the skin at bedtime.     Insulin Pen Needle 32G X 4 MM MISC Use to inject insulin 4 times daily as directed. 200 each 0   ipratropium-albuterol (DUONEB) 0.5-2.5 (3) MG/3ML SOLN Take 3 mLs by nebulization 2 (two) times daily. 360 mL 5  LORazepam (ATIVAN) 0.5 MG tablet Take 1 tablet (0.5 mg total) by mouth at bedtime. 30 tablet 5   MELATONIN GUMMIES PO Take 9 mg by mouth at bedtime as needed.     methimazole (TAPAZOLE) 5 MG tablet TAKE 1 TABLET BY MOUTH EVERY MORNING 90 tablet 2   metoCLOPramide (REGLAN) 10 MG/10ML SOLN Take 5 mLs (5 mg total) by mouth 4 (four) times daily -  before meals and at bedtime for 14 days. 280 mL 0   metoprolol tartrate (LOPRESSOR) 100 MG tablet TAKE 1 TABLET BY MOUTH TWICE DAILY (BREAKFAST, BEDTIME) 180 tablet 2   nystatin cream (MYCOSTATIN) Apply 1 Application topically 2 (two) times daily. Affected areas on breast fold and groin and sacral areas 30 g 0   olmesartan-hydrochlorothiazide (BENICAR HCT) 40-25 MG tablet Take 1 tablet by mouth daily. 90 tablet 3   pantoprazole (PROTONIX) 40 MG tablet Take 1 tablet (40 mg total) by mouth every morning. 90 tablet 2   polyethylene glycol powder (GLYCOLAX/MIRALAX) 17 GM/SCOOP powder Take 17 g by mouth daily. Hold for loose stool 3350 g 1   senna (SENOKOT) 8.6 MG tablet Take 8.6 mg by mouth as needed for constipation.     tamsulosin (FLOMAX) 0.4 MG CAPS capsule TAKE 1 CAPSULE BY MOUTH AT BEDTIME 90 capsule 2   topiramate (TOPAMAX) 25 MG tablet TAKE 1 TABLET BY MOUTH TWICE DAILY FOR NEUROPATHY 60 tablet 3   zinc oxide (BALMEX) 11.3 % CREA cream Apply 1 Application topically 2 (two) times daily. 113 g 5   No current facility-administered medications for this visit.     Assessment & Plan   BP 141/105   Repeat  131/93  Essential hypertension Assessment: ?  BP uncontrolled in office BP 131/93 mmHg; above the goal (<130/80) ? Patient is non-compliant to midday and evening doses of medications ? Tolerates amlodipine, olmesartan/HCTZ, and metoprolol tartrate without any adverse effects ? Denies SOB, palpitation, chest pain, headaches, or swelling  Plan: ? Continue taking amlodipine 10 mg daily, olmesartan/HCTZ 40/25, and metoprolol tartrate 100 mg BID ? Patient to keep taking BP at home and bring log in to next visit ? Patient to bring new medication pack in to next visit ? Patient to go to the lab to check: renin, aldosterone, metanephrines, catecholamines, and cortisol ? Follow up with PharmD advanced hypertension clinic in 1 month   Tommy Medal PharmD CPP [facility]  [address] [place], [place] [address] [phone]

## 2022-02-26 NOTE — Assessment & Plan Note (Signed)
Assessment: ? BP uncontrolled in office BP 131/93 mmHg; above the goal (<130/80) ? Patient is non-compliant to midday and evening doses of medications ? Tolerates amlodipine, olmesartan/HCTZ, and metoprolol tartrate without any adverse effects ? Denies SOB, palpitation, chest pain, headaches, or swelling  Plan: ? Continue taking amlodipine 10 mg daily, olmesartan/HCTZ 40/25, and metoprolol tartrate 100 mg BID ? Patient to keep taking BP at home and bring log in to next visit ? Patient to bring new medication pack in to next visit ? Patient to go to the lab to check: renin, aldosterone, metanephrines, catecholamines, and cortisol ? Follow up with PharmD advanced hypertension clinic in 1 month

## 2022-02-26 NOTE — Telephone Encounter (Signed)
Left message on voicemail for patient to return call when available   

## 2022-02-28 ENCOUNTER — Ambulatory Visit: Payer: Medicare Other | Admitting: Family

## 2022-02-28 ENCOUNTER — Telehealth: Payer: Self-pay

## 2022-02-28 NOTE — Telephone Encounter (Signed)
[facility] is calling because they will need the last Office and chart notes to continue supplying the hospital bed. They also wanted to make sure the notes that mentioned the need for the bed to be recent. Fax is [phone]. She has an upcoming appointment tomorrow

## 2022-02-28 NOTE — Telephone Encounter (Signed)
Late entry, spoke with patient this morning and scheduled an appointment for today

## 2022-03-01 ENCOUNTER — Telehealth: Payer: Self-pay

## 2022-03-01 ENCOUNTER — Encounter: Payer: Self-pay | Admitting: Internal Medicine

## 2022-03-01 ENCOUNTER — Encounter: Payer: Medicare Other | Admitting: Family

## 2022-03-01 ENCOUNTER — Ambulatory Visit: Payer: Medicaid Other | Attending: Internal Medicine | Admitting: Internal Medicine

## 2022-03-01 VITALS — BP 125/80 | HR 70 | Ht 62.0 in | Wt 188.6 lb

## 2022-03-01 DIAGNOSIS — Z794 Long term (current) use of insulin: Secondary | ICD-10-CM | POA: Diagnosis present

## 2022-03-01 DIAGNOSIS — Z951 Presence of aortocoronary bypass graft: Secondary | ICD-10-CM | POA: Diagnosis present

## 2022-03-01 DIAGNOSIS — I700 Atherosclerosis of aorta: Secondary | ICD-10-CM

## 2022-03-01 DIAGNOSIS — E1159 Type 2 diabetes mellitus with other circulatory complications: Secondary | ICD-10-CM

## 2022-03-01 DIAGNOSIS — E1169 Type 2 diabetes mellitus with other specified complication: Secondary | ICD-10-CM

## 2022-03-01 DIAGNOSIS — I152 Hypertension secondary to endocrine disorders: Secondary | ICD-10-CM | POA: Diagnosis present

## 2022-03-01 DIAGNOSIS — E785 Hyperlipidemia, unspecified: Secondary | ICD-10-CM

## 2022-03-01 DIAGNOSIS — E118 Type 2 diabetes mellitus with unspecified complications: Secondary | ICD-10-CM | POA: Diagnosis present

## 2022-03-01 DIAGNOSIS — I639 Cerebral infarction, unspecified: Secondary | ICD-10-CM

## 2022-03-01 NOTE — Patient Instructions (Signed)
Medication Instructions:  No changes *If you need a refill on your cardiac medications before your next appointment, please call your pharmacy*   Lab Work: Today: lipids/liver panel/Lp(a)  If you have labs (blood work) drawn today and your tests are completely normal, you will receive your results only by: [place] (if you have MyChart) OR A paper copy in the mail If you have any lab test that is abnormal or we need to change your treatment, we will call you to review the results.   Testing/Procedures: none   Follow-Up: At [facility], you and your health needs are our priority.  As part of our continuing mission to provide you with exceptional heart care, we have created designated Provider Care Teams.  These Care Teams include your primary Cardiologist (physician) and Advanced Practice Providers (APPs -  Physician Assistants and Nurse Practitioners) who all work together to provide you with the care you need, when you need it.   Your next appointment:   6 month(s)  Provider:   Advanced Practice Provider (NP or PA-C)

## 2022-03-01 NOTE — Telephone Encounter (Signed)
noted 

## 2022-03-01 NOTE — Telephone Encounter (Signed)
Patient cancelled appointment for today 03/01/2022 and rescheduled for 03/05/2022.

## 2022-03-01 NOTE — Telephone Encounter (Signed)
Lori [name], speech pathologist with [facility] called requesting verbal orders for speech therapy for 1 time a week for 7 weeks for dysphagia and voice.  Called back and left message giving verbal orders

## 2022-03-02 LAB — LIPID PANEL
Chol/HDL Ratio: 3.4 ratio (ref 0.0–4.4)
Cholesterol, Total: 148 mg/dL (ref 100–199)
HDL: 44 mg/dL (ref >39)
LDL Chol Calc (NIH): 79 mg/dL (ref 0–99)
Triglycerides: 143 mg/dL (ref 0–149)
VLDL Cholesterol Cal: 25 mg/dL (ref 5–40)

## 2022-03-02 LAB — HEPATIC FUNCTION PANEL
ALT: 13 IU/L (ref 0–32)
AST: 16 IU/L (ref 0–40)
Albumin: 3.9 g/dL (ref 3.9–4.9)
Alkaline Phosphatase: 117 IU/L (ref 44–121)
Bilirubin Total: 0.4 mg/dL (ref 0.0–1.2)
Bilirubin, Direct: <0.1 mg/dL (ref 0.00–0.40)
Total Protein: 6.3 g/dL (ref 6.0–8.5)

## 2022-03-02 LAB — LIPOPROTEIN A (LPA): Lipoprotein (a): 180.2 nmol/L — ABNORMAL HIGH (ref <75.0)

## 2022-03-04 ENCOUNTER — Other Ambulatory Visit: Payer: Self-pay | Admitting: *Deleted

## 2022-03-04 DIAGNOSIS — E1169 Type 2 diabetes mellitus with other specified complication: Secondary | ICD-10-CM

## 2022-03-04 NOTE — Progress Notes (Signed)
Lpa - pharmD referral placed

## 2022-03-05 ENCOUNTER — Encounter: Payer: Self-pay | Admitting: Family

## 2022-03-05 ENCOUNTER — Ambulatory Visit (INDEPENDENT_AMBULATORY_CARE_PROVIDER_SITE_OTHER): Payer: Medicaid Other | Admitting: Family

## 2022-03-05 VITALS — BP 136/84 | HR 84 | Temp 97.1°F | Resp 18 | Ht 62.0 in | Wt 190.0 lb

## 2022-03-05 DIAGNOSIS — I6523 Occlusion and stenosis of bilateral carotid arteries: Secondary | ICD-10-CM

## 2022-03-05 DIAGNOSIS — M25511 Pain in right shoulder: Secondary | ICD-10-CM

## 2022-03-05 DIAGNOSIS — Z8673 Personal history of transient ischemic attack (TIA), and cerebral infarction without residual deficits: Secondary | ICD-10-CM | POA: Diagnosis not present

## 2022-03-05 DIAGNOSIS — M24542 Contracture, left hand: Secondary | ICD-10-CM | POA: Diagnosis not present

## 2022-03-05 DIAGNOSIS — J4520 Mild intermittent asthma, uncomplicated: Secondary | ICD-10-CM | POA: Diagnosis not present

## 2022-03-05 NOTE — Progress Notes (Addendum)
Provider: Marlowe Sax FNP-C  Cassidie Veiga, Nelda Bucks, NP  Patient Care Team: Chanell Nadeau, Nelda Bucks, NP as PCP - General (Family Medicine) Early Osmond, MD as PCP - Cardiology (Cardiology)  Extended Emergency Contact Information Primary Emergency Contact: [name],[name] Mobile Phone: [phone] Relation: Spouse Preferred language: English Interpreter needed? No Secondary Emergency Contact: Dixie Phone: [phone] Mobile Phone: [phone] Relation: Daughter Mother: Gayla Doss Phone: [phone]  Code Status:  Full Code  Goals of care: Advanced Directive information    03/05/2022    2:19 PM  Advanced Directives  Does Patient Have a Medical Advance Directive? No  Would patient like information on creating a medical advance directive? No - Patient declined     Chief Complaint  Patient presents with   Acute Visit    Patient complains of right shoulder pain.    HPI:  Pt is a 52 y.o. female seen today for an acute visit for evaluation of right shoulder pain post fall and Face to face evaluation for [employer] wheelchair.states was thinking the husband brought medication and left on the side tablet so she tried to reach over on the table and fell out of the bed.Husband was at work.she called EMS and caught her out of the floor.she did not hit her head. She hit her right shoulder. States shoulder hurts a little bit rates 4/10 on scale.   She also request forms filled for [employer] bus to take her for medical appointment.   She will request  DME Power wheelchair to allow her to maintain current level of independence with ADL's which cannot be achieved with Scooter,Rolling walker or cane. Patient suffers from unsteady gait and left side weakness post stroke which impairs her ability to perform daily activities like bathing,walking,dressing,grooming and toileting in the home.  Has left hand contracture and left lower extremity weakness unable to self propel  on wheelchair or use a scooter. Has mental and physical ability to operate power wheelchair and willing to use in her home.Family members also available to assist. Addendum: She will be referred to Physical Therapy for mobility and seating evaluation for power wheelchair.  Past Medical History:  Diagnosis Date   CAD (coronary artery disease)    Depression    Diabetes mellitus without complication ([place])    History of CT scan    History of mammogram    History of MRI    Hypertension    Pheochromocytoma    Pheochromocytoma    Stroke ([place])    Thyroid disease    Past Surgical History:  Procedure Laterality Date   ABDOMINAL HYSTERECTOMY     CESAREAN SECTION     3   CORONARY ARTERY BYPASS GRAFT     Right adrenal gland removal for pheochromocytoma Right     Allergies  Allergen Reactions   Contrast Media [Iodinated Contrast Media] Anaphylaxis and Swelling   Sumatriptan Anaphylaxis   Aspirin Swelling   Morphine Swelling   Penicillins Swelling    Mouth swells up and eyes swollen shut    Outpatient Encounter Medications as of 03/05/2022  Medication Sig   albuterol (PROVENTIL) (2.5 MG/3ML) 0.083% nebulizer solution Take 3 mLs (2.5 mg total) by nebulization every 6 (six) hours as needed for wheezing or shortness of breath.   amLODipine (NORVASC) 10 MG tablet Take 1 tablet (10 mg total) by mouth daily.   atorvastatin (LIPITOR) 80 MG tablet Take 80 mg by mouth daily.   blood glucose meter kit and supplies KIT Dispense based on patient and  insurance preference. Use up to four times daily as directed.   budesonide (PULMICORT) 0.25 MG/2ML nebulizer solution Take 2 mLs (0.25 mg total) by nebulization 2 (two) times daily.   clopidogrel (PLAVIX) 75 MG tablet Take 75 mg by mouth daily.   Continuous Blood Gluc Receiver (DEXCOM G5 RECEIVER KIT) DEVI 1 Device by Does not apply route 4 (four) times daily.   cyclobenzaprine (FLEXERIL) 5 MG tablet TAKE 1 TABLET(5 MG) BY MOUTH TWICE DAILY FOR UP TO 7  DAYS AS NEEDED FOR MUSCLE SPASMS   diclofenac Sodium (VOLTAREN) 1 % GEL Apply 4 g topically 4 (four) times daily.   diphenhydrAMINE (BENADRYL) 25 MG tablet Take 25 mg by mouth at bedtime.   escitalopram (LEXAPRO) 20 MG tablet Take 1 tablet (20 mg total) by mouth every morning.   ezetimibe (ZETIA) 10 MG tablet Take 1 tablet (10 mg total) by mouth daily.   gabapentin (NEURONTIN) 100 MG capsule TAKE ONE CAPSULE BY MOUTH THREE TIMES DAILY FOR NEUROPATHY   guaiFENesin (MUCINEX) 600 MG 12 hr tablet Take 1 tablet (600 mg total) by mouth 2 (two) times daily.   hydrOXYzine (ATARAX) 25 MG tablet Take 1 tablet (25 mg total) by mouth at bedtime as needed for anxiety.   insulin aspart (NOVOLOG) 100 UNIT/ML FlexPen Inject 7 Units into the skin 3 (three) times daily with meals.   insulin glargine (LANTUS) 100 UNIT/ML Solostar Pen Inject 38 Units into the skin at bedtime.   Insulin Pen Needle 32G X 4 MM MISC Use to inject insulin 4 times daily as directed.   ipratropium-albuterol (DUONEB) 0.5-2.5 (3) MG/3ML SOLN Take 3 mLs by nebulization 2 (two) times daily.   LORazepam (ATIVAN) 0.5 MG tablet Take 1 tablet (0.5 mg total) by mouth at bedtime.   MELATONIN GUMMIES PO Take 9 mg by mouth at bedtime as needed.   methimazole (TAPAZOLE) 5 MG tablet TAKE 1 TABLET BY MOUTH EVERY MORNING   metoprolol tartrate (LOPRESSOR) 100 MG tablet TAKE 1 TABLET BY MOUTH TWICE DAILY (BREAKFAST, BEDTIME)   nystatin cream (MYCOSTATIN) Apply 1 Application topically 2 (two) times daily. Affected areas on breast fold and groin and sacral areas   olmesartan-hydrochlorothiazide (BENICAR HCT) 40-25 MG tablet Take 1 tablet by mouth daily.   pantoprazole (PROTONIX) 40 MG tablet Take 1 tablet (40 mg total) by mouth every morning.   polyethylene glycol powder (GLYCOLAX/MIRALAX) 17 GM/SCOOP powder Take 17 g by mouth daily. Hold for loose stool   senna (SENOKOT) 8.6 MG tablet Take 8.6 mg by mouth as needed for constipation.   tamsulosin (FLOMAX)  0.4 MG CAPS capsule TAKE 1 CAPSULE BY MOUTH AT BEDTIME   topiramate (TOPAMAX) 25 MG tablet TAKE 1 TABLET BY MOUTH TWICE DAILY FOR NEUROPATHY   zinc oxide (BALMEX) 11.3 % CREA cream Apply 1 Application topically 2 (two) times daily.   metoCLOPramide (REGLAN) 10 MG/10ML SOLN Take 5 mLs (5 mg total) by mouth 4 (four) times daily -  before meals and at bedtime for 14 days.   No facility-administered encounter medications on file as of 03/05/2022.    Review of Systems  Constitutional:  Negative for appetite change, chills, fatigue, fever and unexpected weight change.  HENT:  Negative for congestion, dental problem, ear discharge, ear pain, facial swelling, hearing loss, nosebleeds, postnasal drip, rhinorrhea, sinus pressure, sinus pain, sneezing, sore throat, tinnitus and trouble swallowing.   Eyes:  Negative for pain, discharge, redness, itching and visual disturbance.  Respiratory:  Negative for cough, chest tightness, shortness of  breath and wheezing.   Cardiovascular:  Negative for chest pain, palpitations and leg swelling.  Gastrointestinal:  Negative for abdominal distention, abdominal pain, blood in stool, constipation, diarrhea, nausea and vomiting.  Endocrine: Negative for cold intolerance, heat intolerance, polydipsia, polyphagia and polyuria.  Genitourinary:  Negative for difficulty urinating, dysuria, flank pain, frequency and urgency.  Musculoskeletal:  Positive for gait problem. Negative for arthralgias, back pain, joint swelling, myalgias, neck pain and neck stiffness.  Skin:  Negative for color change, pallor, rash and wound.  Neurological:  Positive for weakness. Negative for dizziness, syncope, speech difficulty, light-headedness, numbness and headaches.  Hematological:  Does not bruise/bleed easily.  Psychiatric/Behavioral:  Negative for agitation, behavioral problems, confusion, hallucinations, self-injury, sleep disturbance and suicidal ideas. The patient is not nervous/anxious.       There is no immunization history on file for this patient. Pertinent  Health Maintenance Due  Topic Date Due   FOOT EXAM  Never done   OPHTHALMOLOGY EXAM  Never done   COLONOSCOPY (Pts 45-30yr Insurance coverage will need to be confirmed)  Never done   INFLUENZA VACCINE  Never done   HEMOGLOBIN A1C  04/23/2022   PAP SMEAR-Modifier  Discontinued      11/22/2021    1:36 PM 12/20/2021    1:46 PM 01/15/2022    1:25 PM 01/23/2022    1:54 PM 03/05/2022    2:19 PM  Fall Risk  Falls in the past year? '1 1 1 '$ 0 1  Was there an injury with Fall? '1 1 1 '$ 0 1  Fall Risk Category Calculator '3 3 3 '$ 0 3  Fall Risk Category (Retired) [employer]   (RETIRED) Patient Fall Risk Level High fall risk High fall risk Moderate fall risk Moderate fall risk   Patient at Risk for Falls Due to History of fall(s);Impaired balance/gait;Impaired mobility History of fall(s);Impaired balance/gait;Impaired mobility History of fall(s);Impaired balance/gait;Impaired mobility History of fall(s);Impaired balance/gait;Impaired mobility History of fall(s);Impaired balance/gait;Impaired mobility  Fall risk Follow up Falls evaluation completed;Education provided;Falls prevention discussed Falls evaluation completed;Education provided;Falls prevention discussed Falls evaluation completed;Education provided;Falls prevention discussed Falls evaluation completed Falls evaluation completed;Education provided;Falls prevention discussed   Functional Status Survey:    Vitals:   03/05/22 1413  BP: 136/84  Pulse: 84  Resp: 18  Temp: (!) 97.1 F (36.2 C)  SpO2: 96%  Weight: 190 lb (86.2 kg)  Height: '5\' 2"'$  (1.575 m)   Body mass index is 34.75 kg/m. Physical Exam Vitals reviewed.  Constitutional:      General: She is not in acute distress.    Appearance: Normal appearance. She is obese. She is not ill-appearing or diaphoretic.  HENT:     Head: Normocephalic.     Right Ear: Tympanic membrane, ear canal and  external ear normal. There is no impacted cerumen.     Left Ear: Tympanic membrane, ear canal and external ear normal. There is no impacted cerumen.     Nose: Nose normal. No congestion or rhinorrhea.     Mouth/Throat:     Mouth: Mucous membranes are moist.     Pharynx: Oropharynx is clear. No oropharyngeal exudate or posterior oropharyngeal erythema.  Eyes:     General: No scleral icterus.       Right eye: No discharge.        Left eye: No discharge.     Extraocular Movements: Extraocular movements intact.     Conjunctiva/sclera: Conjunctivae normal.     Pupils: Pupils are equal, round, and reactive  to light.  Neck:     Vascular: No carotid bruit.  Cardiovascular:     Rate and Rhythm: Normal rate and regular rhythm.     Pulses: Normal pulses.     Heart sounds: Normal heart sounds. No murmur heard.    No friction rub. No gallop.  Pulmonary:     Effort: Pulmonary effort is normal. No respiratory distress.     Breath sounds: Normal breath sounds. No wheezing, rhonchi or rales.  Chest:     Chest wall: No tenderness.  Abdominal:     General: Bowel sounds are normal. There is no distension.     Palpations: Abdomen is soft. There is no mass.     Tenderness: There is no abdominal tenderness. There is no right CVA tenderness, left CVA tenderness, guarding or rebound.  Musculoskeletal:        General: No swelling.     Right shoulder: Tenderness present. No swelling, effusion or crepitus. Normal range of motion. Normal strength. Normal pulse.     Left shoulder: Normal.     Cervical back: Normal range of motion. No rigidity or tenderness.     Right lower leg: No edema.     Left lower leg: No edema.     Comments: Left hand contracture   Lymphadenopathy:     Cervical: No cervical adenopathy.  Skin:    General: Skin is warm and dry.     Coloration: Skin is not pale.     Findings: No bruising, erythema, lesion or rash.  Neurological:     Mental Status: She is alert and oriented to  person, place, and time.     Cranial Nerves: No cranial nerve deficit.     Sensory: No sensory deficit.     Motor: Weakness present.     Coordination: Coordination normal.     Gait: Gait abnormal.     Comments: Let side weakness   Psychiatric:        Mood and Affect: Mood normal.        Speech: Speech normal.        Behavior: Behavior normal.        Thought Content: Thought content normal.        Judgment: Judgment normal.     Labs reviewed: Recent Labs    11/03/21 0811 11/04/21 0317 11/05/21 0259 11/06/21 0307  NA  --  137 138 139  K  --  3.9 3.3* 3.5  CL  --  112* 112* 112*  CO2  --  18* 20* 21*  GLUCOSE  --  233* 113* 93  BUN  --  '18 20 19  '$ CREATININE  --  0.64 0.69 0.55  CALCIUM  --  8.0* 8.3* 8.4*  MG 1.7 2.2 2.1  --    Recent Labs    11/02/21 2329 11/03/21 0451 03/01/22 0828  AST 11* 15 16  ALT '13 12 13  '$ ALKPHOS 77 77 117  BILITOT 1.0 1.0 0.4  PROT 7.5 7.2 6.3  ALBUMIN 3.6 3.4* 3.9   Recent Labs    11/04/21 0317 11/05/21 0259 11/06/21 0307  WBC 16.8* 11.7* 8.6  NEUTROABS 15.0* 7.9* 5.0  HGB 11.8* 12.5 13.0  HCT 37.9 38.3 39.1  MCV 82.0 78.6* 77.4*  PLT 292 318 287   Lab Results  Component Value Date   TSH 1.399 11/02/2021   Lab Results  Component Value Date   HGBA1C 10.5 (H) 10/23/2021   Lab Results  Component Value Date   CHOL  148 03/01/2022   HDL 44 03/01/2022   LDLCALC 79 03/01/2022   TRIG 143 03/01/2022   CHOLHDL 3.4 03/01/2022    Significant Diagnostic Results in last 30 days:  VAS US CAROTID  Result Date: 02/16/2022 Carotid Arterial Duplex Study Patient Name:  TRYSTA ULREY  Date of Exam:   02/15/2022 Medical Rec #: [id number]            Accession #:    [id number] Date of Birth: 08/05/1970             Patient Gender: F Patient Age:   72 years Exam Location:  [facility] Procedure:      VAS US CAROTID Referring Phys: [name] Ephraim --------------------------------------------------------------------------------  Indications:        Patient has had history of multiple CVAs. Risk Factors:      Hypertension, hyperlipidemia, Diabetes, no history of                    smoking, coronary artery disease, prior CVA. Other Factors:     History of CABG. Comparison Study:  None Performing Technologist: Alecia Mackin RVT, RDCS (AE), RDMS  Examination Guidelines: A complete evaluation includes B-mode imaging, spectral Doppler, color Doppler, and power Doppler as needed of all accessible portions of each vessel. Bilateral testing is considered an integral part of a complete examination. Limited examinations for reoccurring indications may be performed as noted.  Right Carotid Findings: +----------+--------+--------+--------+---------------------+------------------+           PSV cm/sEDV cm/sStenosisPlaque Description   Comments           +----------+--------+--------+--------+---------------------+------------------+ CCA Prox  55      14                                                      +----------+--------+--------+--------+---------------------+------------------+ CCA Mid   72      22              heterogenous and     intimal thickening                                   smooth                                  +----------+--------+--------+--------+---------------------+------------------+ CCA Distal75      26      <50%    diffuse, smooth and  intimal thickening                                   calcific                                +----------+--------+--------+--------+---------------------+------------------+ ICA Prox  57      17              focal and calcific                      +----------+--------+--------+--------+---------------------+------------------+ ICA Mid   61      28      1-39%                                           +----------+--------+--------+--------+---------------------+------------------+  ICA Distal55      28                                                       +----------+--------+--------+--------+---------------------+------------------+ ECA       66      12                                                      +----------+--------+--------+--------+---------------------+------------------+ +----------+--------+-------+----------------+-------------------+           PSV cm/sEDV cmsDescribe        Arm Pressure (mmHG) +----------+--------+-------+----------------+-------------------+ [id number]            Multiphasic, [id number]                  +----------+--------+-------+----------------+-------------------+ +---------+--------+--+--------+-+---------+ VertebralPSV cm/s26EDV cm/s7Antegrade +---------+--------+--+--------+-+---------+  Left Carotid Findings: +----------+--------+--------+--------+-------------------------------+--------+           PSV cm/sEDV cm/sStenosisPlaque Description             Comments +----------+--------+--------+--------+-------------------------------+--------+ CCA Prox  81      29                                                      +----------+--------+--------+--------+-------------------------------+--------+ CCA Mid   88      38      <50%    diffuse, smooth, calcific and                                             heterogenous                            +----------+--------+--------+--------+-------------------------------+--------+ CCA Distal95      36              diffuse and calcific                    +----------+--------+--------+--------+-------------------------------+--------+ ICA Prox  47      11              diffuse and calcific                    +----------+--------+--------+--------+-------------------------------+--------+ ICA Mid   63      31      1-39%                                           +----------+--------+--------+--------+-------------------------------+--------+ ICA Distal73      34                                                       +----------+--------+--------+--------+-------------------------------+--------+  ECA       147     27              focal and calcific                      +----------+--------+--------+--------+-------------------------------+--------+ +----------+--------+--------+----------------+-------------------+           PSV cm/sEDV cm/sDescribe        Arm Pressure (mmHG) +----------+--------+--------+----------------+-------------------+ [id number]             Multiphasic, [id number]                  +----------+--------+--------+----------------+-------------------+ +---------+--------+--+--------+-+--------------+ VertebralPSV cm/s20EDV cm/s1High resistant +---------+--------+--+--------+-+--------------+   Summary: Right Carotid: Velocities in the right ICA are consistent with a 1-39% stenosis.                Non-hemodynamically significant plaque <50% noted in the CCA. Left Carotid: Velocities in the left ICA are consistent with a 1-39% stenosis.               Non-hemodynamically significant plaque <50% noted in the CCA. Vertebrals:  Bilateral vertebral arteries demonstrate antegrade flow. Subclavians: Normal flow hemodynamics were seen in bilateral subclavian              arteries. *See table(s) above for measurements and observations. Suggest follow up study in 12 months due to plaque burden. Electronically signed by Larae Grooms MD on 02/16/2022 at 8:55:52 AM.    Final     Assessment/Plan 1. Acute pain of right shoulder S/p post fall from reaching over to her bedside tablet Declined imaging states pain has improved.ROM intact just sore to palpation   2. History of CVA (cerebrovascular accident) Requires Power wheelchair to allow her to maintain current level of independence with ADL's which cannot be achieved with Scooter,Rolling walker or cane. Patient suffers from unsteady gait and left side weakness post stroke which impairs her ability to perform daily activities like  bathing,walking,dressing,grooming and toileting in the home.  Has left hand contracture and left lower extremity weakness unable to self propel on wheelchair or use a scooter. Has mental and physical ability to operate power wheelchair and willing to use in her home.Family members also available to assist. - Referred to Physical Therapy for mobility and seating evaluation for power wheelchair. - DME Wheelchair electric: script faxed by CMA to [place]   3. Mild intermittent asthma without complication Breathing stable   4. 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   [name], NP

## 2022-03-19 ENCOUNTER — Other Ambulatory Visit: Payer: Self-pay | Admitting: Family

## 2022-03-19 DIAGNOSIS — Z8673 Personal history of transient ischemic attack (TIA), and cerebral infarction without residual deficits: Secondary | ICD-10-CM

## 2022-03-19 DIAGNOSIS — R2681 Unsteadiness on feet: Secondary | ICD-10-CM

## 2022-03-19 DIAGNOSIS — M24542 Contracture, left hand: Secondary | ICD-10-CM

## 2022-03-19 DIAGNOSIS — M62838 Other muscle spasm: Secondary | ICD-10-CM

## 2022-03-20 ENCOUNTER — Telehealth: Payer: Self-pay

## 2022-03-20 DIAGNOSIS — Z8673 Personal history of transient ischemic attack (TIA), and cerebral infarction without residual deficits: Secondary | ICD-10-CM

## 2022-03-20 DIAGNOSIS — R2681 Unsteadiness on feet: Secondary | ICD-10-CM

## 2022-03-20 NOTE — Telephone Encounter (Signed)
Apply Aveeno anti-itch lotion as needed for itching.Benicar managed by Hypertension clinic.

## 2022-03-20 NOTE — Telephone Encounter (Signed)
[name] with [facility] called stating that patient is complaining of itching on the back and abdomen. There is no sing of rash. Thinks it may be due to the [place]. Please advise.  Message routed to Marlowe Sax, NP

## 2022-03-20 NOTE — Telephone Encounter (Signed)
Called and left very detailed message for patient regarding the itching and benicar

## 2022-03-20 NOTE — Telephone Encounter (Signed)
Patient called and left message on clinical intake voicemail requesting order for shower bench.  Message sent to Marlowe Sax, NP

## 2022-03-22 NOTE — Telephone Encounter (Signed)
Order faxed to [facility]

## 2022-03-26 ENCOUNTER — Telehealth: Payer: Self-pay | Admitting: *Deleted

## 2022-03-26 NOTE — Telephone Encounter (Signed)
Najel with [facility] called and stated that she is seeing patient today and patient has Thrush in mouth. No other symptoms noted.   Requesting a mouthwash to help resolve the thrush.  Please Advise. (Message sent to [facility] due to Webb Silversmith out of office)

## 2022-03-27 ENCOUNTER — Other Ambulatory Visit: Payer: Self-pay | Admitting: Adult Health

## 2022-03-27 MED ORDER — NYSTATIN 100000 UNIT/ML MT SUSP: 5.0000 mL | Freq: Four times a day (QID) | OROMUCOSAL | 0 refills | Status: AC

## 2022-03-27 NOTE — Telephone Encounter (Signed)
Medina-Vargas, Monina C, NP  You11 minutes ago (1:41 PM)    Nystatin sent to pharmacy.    Home Health Notified.

## 2022-03-28 ENCOUNTER — Ambulatory Visit (HOSPITAL_BASED_OUTPATIENT_CLINIC_OR_DEPARTMENT_OTHER): Payer: Medicaid Other

## 2022-03-28 NOTE — Progress Notes (Deleted)
Office Visit    Patient Name: Lori [name] Date of Encounter: 03/28/2022  Primary Care Provider:  [name], NP Primary Cardiologist:  Early Osmond, MD  Chief Complaint    Hypertension - Advanced hypertension clinic  Past Medical History   CAD s/p CABG in 2018  Recurrent stroke clopidogrel 75 mg daily  HLD atorvastatin 80, ezetimibe 10 mg daily; LDL 70  DM novolog 7 units TID and lantus 38 units at bedtime; A1c 10.5  Aortic atherosclerosis   Carotid stenosis   pheochromocytoma post adrenalectomy  hyperthyroidism methimazole 5 mg daily  Anxiety/depression escitalopram 20 mg, hydroxyzine 25 mg PRN, lorazepam 0.5 mg PRN  neuropathy topiramate 25 mg BID, gabapentin 100 mg TID    Allergies  Allergen Reactions   Contrast Media [Iodinated Contrast Media] Anaphylaxis and Swelling   Sumatriptan Anaphylaxis   Aspirin Swelling   Morphine Swelling   Penicillins Swelling    Mouth swells up and eyes swollen shut    History of Present Illness    Lori [name] is a 52 y.o. female patient who was referred to the [facility] for follow up. She saw Dr. Oval Linsey in December and her BP was 166/112. Amlodipine 10 mg and olmesartan 40 mg were continued, HCTZ was increased to 25 mg daily.  At her last visit we noted problems with compliance despite having medications pre-packed by time of day and with multiple med changes, about half were in bottles.    Today she returns for follow up.  Recent labs showed her LDL cholesterol to be at 79, however Lp(a) was at 180.2.  Blood Pressure Goal: 130/80  LDL goal: LDL < 70  Current Medications: amlodipine 10 mg daily, olmesartan/HCTZ 40/25, metoprolol tartrate 100 mg BID  Atorvastatin 80 mg qd, ezetimibe 10 mg qd,   Adherence Assessment  Do you ever forget to take your medication? [x]$ Yes []$ No  Do you ever skip doses due to side effects? []$ Yes []$ No  Do you have trouble affording your medicines?  []$ Yes [x]$ No  Are you ever unable to pick up your medication due to transportation difficulties? [x]$ Yes - dose pack mailed, but has wrong address - advised pt to fix ASAP []$ No  Do you ever stop taking your medications because you don't believe they are helping? []$ Yes []$ No  Do you check your weight daily? []$ Yes []$ No   Adherence strategy: pill pack - still missing multiple doses/week  Previously tried: lisinopril  Family Hx: mother with HTN; father with HTN and previous heart attack  Social Hx: Tobacco: none Alcohol: none Caffeine: 1 cup of coffee occasionally  Diet: Used to eat out all the time but starting to cook more at home after recent move, does not add salt to meals, likes all vegetables, eats all meats except pork, snacks on cookies  Exercise: unable to get much exercise due to recurrent stroke  Home BP readings: Did not provide log; usually ranges from 114-130/61-77   Accessory Clinical Findings    Lab Results  Component Value Date   CREATININE 0.55 11/06/2021   BUN 19 11/06/2021   NA 139 11/06/2021   K 3.5 11/06/2021   CL 112 (H) 11/06/2021   CO2 21 (L) 11/06/2021   Lab Results  Component Value Date   ALT 13 03/01/2022   AST 16 03/01/2022   ALKPHOS 117 03/01/2022   BILITOT 0.4 03/01/2022   Lab Results  Component Value Date   HGBA1C 10.5 (H) 10/23/2021    Screening for Secondary  Hypertension:      01/23/2022    3:22 PM  Causes  Drugs/Herbals Screened    Relevant Labs/Studies:    Latest Ref Rng & Units 11/06/2021    3:07 AM 11/05/2021    2:59 AM 11/04/2021    3:17 AM  Basic Labs  Sodium 135 - 145 mmol/L 139  138  137   Potassium 3.5 - 5.1 mmol/L 3.5  3.3  3.9   Creatinine 0.44 - 1.00 mg/dL 0.55  0.69  0.64        Latest Ref Rng & Units 11/02/2021    2:46 AM 10/23/2021   11:37 AM  Thyroid   TSH 0.350 - 4.500 uIU/mL 1.399  0.93                   Home Medications    Current Outpatient Medications  Medication Sig Dispense Refill    albuterol (PROVENTIL) (2.5 MG/3ML) 0.083% nebulizer solution Take 3 mLs (2.5 mg total) by nebulization every 6 (six) hours as needed for wheezing or shortness of breath. 75 mL 12   amLODipine (NORVASC) 10 MG tablet Take 1 tablet (10 mg total) by mouth daily. 30 tablet 0   atorvastatin (LIPITOR) 80 MG tablet Take 80 mg by mouth daily.     blood glucose meter kit and supplies KIT Dispense based on patient and insurance preference. Use up to four times daily as directed. 1 each 0   budesonide (PULMICORT) 0.25 MG/2ML nebulizer solution Take 2 mLs (0.25 mg total) by nebulization 2 (two) times daily. 60 mL 12   clopidogrel (PLAVIX) 75 MG tablet Take 75 mg by mouth daily.     Continuous Blood Gluc Receiver (DEXCOM G5 RECEIVER KIT) DEVI 1 Device by Does not apply route 4 (four) times daily. 1 each 5   cyclobenzaprine (FLEXERIL) 5 MG tablet TAKE 1 TABLET(5 MG) BY MOUTH TWICE DAILY FOR UP TO 7 DAYS AS NEEDED FOR MUSCLE SPASMS 30 tablet 3   diclofenac Sodium (VOLTAREN) 1 % GEL Apply 4 g topically 4 (four) times daily. 200 g 0   diphenhydrAMINE (BENADRYL) 25 MG tablet Take 25 mg by mouth at bedtime.     escitalopram (LEXAPRO) 20 MG tablet Take 1 tablet (20 mg total) by mouth every morning. 30 tablet 8   ezetimibe (ZETIA) 10 MG tablet Take 1 tablet (10 mg total) by mouth daily. 90 tablet 1   gabapentin (NEURONTIN) 100 MG capsule TAKE ONE CAPSULE BY MOUTH THREE TIMES DAILY FOR NEUROPATHY 90 capsule 1   guaiFENesin (MUCINEX) 600 MG 12 hr tablet Take 1 tablet (600 mg total) by mouth 2 (two) times daily. 30 tablet 0   hydrOXYzine (ATARAX) 25 MG tablet Take 1 tablet (25 mg total) by mouth at bedtime as needed for anxiety. 30 tablet 0   insulin aspart (NOVOLOG) 100 UNIT/ML FlexPen Inject 7 Units into the skin 3 (three) times daily with meals. 15 mL 5   insulin glargine (LANTUS) 100 UNIT/ML Solostar Pen Inject 38 Units into the skin at bedtime.     Insulin Pen Needle 32G X 4 MM MISC Use to inject insulin 4 times daily  as directed. 200 each 0   ipratropium-albuterol (DUONEB) 0.5-2.5 (3) MG/3ML SOLN Take 3 mLs by nebulization 2 (two) times daily. 360 mL 5   LORazepam (ATIVAN) 0.5 MG tablet Take 1 tablet (0.5 mg total) by mouth at bedtime. 30 tablet 5   MELATONIN GUMMIES PO Take 9 mg by mouth at bedtime as needed.  methimazole (TAPAZOLE) 5 MG tablet TAKE 1 TABLET BY MOUTH EVERY MORNING 90 tablet 2   metoCLOPramide (REGLAN) 10 MG/10ML SOLN Take 5 mLs (5 mg total) by mouth 4 (four) times daily -  before meals and at bedtime for 14 days. 280 mL 0   metoprolol tartrate (LOPRESSOR) 100 MG tablet TAKE 1 TABLET BY MOUTH TWICE DAILY (BREAKFAST, BEDTIME) 180 tablet 2   nystatin (MYCOSTATIN) 100000 UNIT/ML suspension Take 5 mLs (500,000 Units total) by mouth 4 (four) times daily for 14 days. Swish and spit 5 ml four times a day 60 mL 0   nystatin cream (MYCOSTATIN) Apply 1 Application topically 2 (two) times daily. Affected areas on breast fold and groin and sacral areas 30 g 0   olmesartan-hydrochlorothiazide (BENICAR HCT) 40-25 MG tablet Take 1 tablet by mouth daily. 90 tablet 3   pantoprazole (PROTONIX) 40 MG tablet Take 1 tablet (40 mg total) by mouth every morning. 90 tablet 2   polyethylene glycol powder (GLYCOLAX/MIRALAX) 17 GM/SCOOP powder Take 17 g by mouth daily. Hold for loose stool 3350 g 1   senna (SENOKOT) 8.6 MG tablet Take 8.6 mg by mouth as needed for constipation.     tamsulosin (FLOMAX) 0.4 MG CAPS capsule TAKE 1 CAPSULE BY MOUTH AT BEDTIME 90 capsule 2   topiramate (TOPAMAX) 25 MG tablet TAKE 1 TABLET BY MOUTH TWICE DAILY FOR NEUROPATHY 60 tablet 3   zinc oxide (BALMEX) 11.3 % CREA cream Apply 1 Application topically 2 (two) times daily. 113 g 5   No current facility-administered medications for this visit.     Assessment & Plan     No problem-specific Assessment & Plan notes found for this encounter.    Tommy Medal PharmD CPP [place]  [address] [place], [place] [address] [phone]

## 2022-03-29 ENCOUNTER — Encounter: Payer: Medicaid Other | Attending: Physical Medicine & Rehabilitation | Admitting: Physical Medicine & Rehabilitation

## 2022-03-29 ENCOUNTER — Encounter: Payer: Self-pay | Admitting: Physical Medicine & Rehabilitation

## 2022-03-29 VITALS — BP 140/94 | HR 89 | Temp 98.1°F | Ht 62.0 in

## 2022-03-29 DIAGNOSIS — G8114 Spastic hemiplegia affecting left nondominant side: Secondary | ICD-10-CM

## 2022-03-29 DIAGNOSIS — I639 Cerebral infarction, unspecified: Secondary | ICD-10-CM | POA: Diagnosis not present

## 2022-03-29 MED ORDER — ONABOTULINUMTOXINA 100 UNITS IJ SOLR
300.0000 [IU] | Freq: Once | INTRAMUSCULAR | Status: AC
  Administered 2022-03-29: 300 [IU] via INTRAMUSCULAR

## 2022-03-29 NOTE — Patient Instructions (Signed)
You received a Botox injection today. You may experience soreness at the needle injection sites. Please call us if any of the injection sites turns red after a couple days or if there is any drainage. You may experience muscle weakness as a result of Botox. This would improve with time but can take several weeks to improve. °The Botox should start working in about one week. °The Botox usually last 3 months. °The injection can be repeated every 3 months as needed. ° °

## 2022-03-29 NOTE — Progress Notes (Signed)
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 This includes bleeding, bruising, infection, excessive weakness, or medication side effects. A REMS form is on file and signed. Needle: 27g 1" needle electrode Number of units per muscle Total dose 300U  LEFT Biceps100 FCR50 FCU0 FDS50 FDP50 FPL25 PT 25 All injections were done after obtaining appropriate EMG activity and after negative drawback for blood. Chlorhexidine prep due to betadine allergy The patient tolerated the procedure well. Post procedure instructions were given. A followup appointment was made.

## 2022-04-08 ENCOUNTER — Encounter: Payer: Medicare Other | Attending: Physical Medicine & Rehabilitation | Admitting: Physical Medicine and Rehabilitation

## 2022-04-08 ENCOUNTER — Encounter: Payer: Self-pay | Admitting: Physical Medicine and Rehabilitation

## 2022-04-08 VITALS — BP 119/88 | HR 106 | Ht 62.0 in

## 2022-04-08 DIAGNOSIS — R252 Cramp and spasm: Secondary | ICD-10-CM | POA: Diagnosis present

## 2022-04-08 DIAGNOSIS — Z993 Dependence on wheelchair: Secondary | ICD-10-CM | POA: Diagnosis present

## 2022-04-08 DIAGNOSIS — I639 Cerebral infarction, unspecified: Secondary | ICD-10-CM | POA: Diagnosis present

## 2022-04-08 DIAGNOSIS — I69398 Other sequelae of cerebral infarction: Secondary | ICD-10-CM | POA: Diagnosis present

## 2022-04-08 DIAGNOSIS — G8114 Spastic hemiplegia affecting left nondominant side: Secondary | ICD-10-CM | POA: Insufficient documentation

## 2022-04-08 DIAGNOSIS — G8194 Hemiplegia, unspecified affecting left nondominant side: Secondary | ICD-10-CM | POA: Diagnosis present

## 2022-04-08 MED ORDER — BACLOFEN 10 MG PO TABS: 5.0000 mg | ORAL_TABLET | Freq: Three times a day (TID) | ORAL | 0 refills | Status: DC

## 2022-04-08 NOTE — Patient Instructions (Signed)
  Pt is a 52 yr old female with chronic spasticity and L hemiplegia from prior stroke- 10/22- latest one; but said she's has 22 strokes in past.  and Debility related to DKA and recent rehab admission- also has aphasia and poorly controlled HTN   Here for f/u on multiple strokes and spasticity   BP much better than last time I saw her. Doesn't need additional titration of meds.   2.  For L hand/CMC joint arthritis- Try Voltaren gel- for L hand- apply small amount- can get at any drug store- is over the counter- use 4x/day for L hand.   3.  Sees [facility] PT, OT and SLP- 1x/week to extend visits as much as possible.  Don't see how I can have them see her more frequently, because will stop them coming even faster.    4. Baclofen- 5 mg 3x/day x 1 week , then 10 mg 3x/day for spasticity Can call me next month to try and increase if needed. Might need to add Zanaflex or Dantrolene if need be.   5. Can still take Flexeril, but not usually as effective for spasticity    6.  To get new shower bench today.    7. Wait on L hand xray for now- really appears to be CMC arthritis.     8. Needs resting hand splint- will order for pt.    9. F/U on CVA and spasticity in 3 months-

## 2022-04-08 NOTE — Progress Notes (Signed)
+  Subjective:    Patient ID: Lori [name], female    DOB: [birth year], 52 y.o.   MRN: [id number]  HPI Pt is a 52 yr old female with chronic spasticity and L hemiplegia from prior stroke- 10/22- latest one; but said she's has 22 strokes in past.  and Debility related to DKA and recent rehab admission- also has aphasia and poorly controlled HTN   Here for f/u on strokes.   Dr Letta Pate did Botox on 03/29/22-  LEFT Biceps100 FCR50 FCU0 FDS50 FDP50 FPL25 PT 25  Got Botox a couple of weeks ago- ~ 12 days- and says not working well    Is able to straighten L arm a little better.  Feels like the L hand is the same way- doesn't feel like it's any better.    2nd time got Botox by Dr Letta Pate- firs ttime worked really well.  Could really open L hand easily but this time cannot Can only straighten/extend 2nd digit- but has been straight the whole time.   Is now home- doesn't want to go to nursing home anymore- did go to one for awhile- in [place]- 1 hour away from husband- didn't like that.   Getting SLP and PT and OT at home- 1x/week for all three.    Has been taking Flexeril for muscle spasms- mainly at night- Spasms are painful.   Something in L hand/wrist is hurting- mainly L thumb-    Has ordered a new shower bench- to get here today!   Now no stairs in home and can now get w/c in bathroom    Pain Inventory Average Pain 4 Pain Right Now 4 My pain is intermittent and stabbing  LOCATION OF PAIN  hand, fingers, toes  BOWEL Number of stools per week: 3-4 Oral laxative use No    BLADDER Pads    Mobility use a walker use a wheelchair needs help with transfers Do you have any goals in this area?  yes  Function disabled: date disabled 2013 I need assistance with the following:  dressing, bathing, toileting, meal prep, household duties, and shopping Do you have any goals in this area?  yes  Neuro/Psych weakness numbness trouble  walking spasms dizziness anxiety  Prior Studies Any changes since last visit?  yes - right shoulder xray Physicians involved in your care Any changes since last visit?  no   Family History  Problem Relation Age of Onset   Hypertension Mother    Diabetes Mother    Atrial fibrillation Mother    Hypertension Father    Heart attack Father    Dementia Father    Diabetes Brother    Brain cancer Brother    Asthma Daughter    Sickle cell anemia Son    Atrial fibrillation Maternal Uncle    Social History   Socioeconomic History   Marital status: Married    Spouse name: Not on file   Number of children: Not on file   Years of education: Not on file   Highest education level: Not on file  Occupational History   Not on file  Tobacco Use   Smoking status: Never   Smokeless tobacco: Never  Vaping Use   Vaping Use: Never used  Substance and Sexual Activity   Alcohol use: Not Currently   Drug use: Never   Sexual activity: Not on file  Other Topics Concern   Not on file  Social History Narrative   Tobacco use, amount per day now: Never  Past tobacco use, amount per day: Never   How many years did you use tobacco: Never   Alcohol use (drinks per week): Not Currently.   Diet: Eat out a lot.    Do you drink/eat things with caffeine: Sweet Tea   Marital status:   Married                               What year were you married? 2000   Do you live in a house, apartment, assisted living, condo, trailer, etc.? House   Is it one or more stories? 1   How many persons live in your home? 2   Do you have pets in your home?( please list) No   Highest Level of education completed? Bachelors Degree.   Current or past profession: Location manager, [employer].   Do you exercise?   No                               Type and how often?   Do you have a living will? No   Do you have a DNR form?       No                            If not, do you want to  discuss one?   Do you have signed POA/HPOA forms?   No                     If so, please bring to you appointment      Do you have any difficulty bathing or dressing yourself? Yes   Do you have any difficulty preparing food or eating? Yes   Do you have any difficulty managing your medications? Yes   Do you have any difficulty managing your finances? No   Do you have any difficulty affording your medications? Yes   Social Determinants of Health   Financial Resource Strain: Not on file  Food Insecurity: Not on file  Transportation Needs: Not on file  Physical Activity: Not on file  Stress: Not on file  Social Connections: Not on file   Past Surgical History:  Procedure Laterality Date   ABDOMINAL HYSTERECTOMY     CESAREAN SECTION     3   CORONARY ARTERY BYPASS GRAFT     Right adrenal gland removal for pheochromocytoma Right    Past Medical History:  Diagnosis Date   CAD (coronary artery disease)    Depression    Diabetes mellitus without complication ([place])    History of CT scan    History of mammogram    History of MRI    Hypertension    Pheochromocytoma    Pheochromocytoma    Stroke ([place])    Thyroid disease    BP 119/88   Pulse (!) 106   Ht '5\' 2"'$  (1.575 m)   SpO2 98%   BMI 34.75 kg/m   Opioid Risk Score:   Fall Risk Score:  `1  Depression screen [facility] 2/9     03/29/2022   12:56 PM 03/29/2022   12:55 PM 01/23/2022    1:54 PM 07/03/2021   12:23 PM 05/14/2021    2:33 PM  Depression screen [facility] 2/9  Decreased Interest 0 '1 1 1 3  '$ Down, Depressed, Hopeless 0  $'1 1 1 1  'D$ PHQ - 2 Score 0 '2 2 2 4  '$ Altered sleeping     1  Tired, decreased energy   1  1  Change in appetite   1  1  Feeling bad or failure about yourself    1  1  Trouble concentrating   0  0  Moving slowly or fidgety/restless   0  1  Suicidal thoughts   0  0  PHQ-9 Score     9    Review of Systems  Musculoskeletal:  Positive for gait problem.  Neurological:  Positive for dizziness, weakness and  numbness.       Spasms  Psychiatric/Behavioral:         Anxiety       Objective:   Physical Exam  Awake, alert, appropriate, in manual w/c accompanied by husband, NAD Finkelsteins test is (-) on L hand Hyperextends L thumb at tip Hand in fist except thumb/2nd digit which 2nd digit is straight and thumb as above- hyperextended.   Neuro: MAS of 2 in L elbow- can extend to ~ -5 degrees of elbow extension MAS of 3 in L shoulder- esp external rotation MAS of 3 in L hand- cannot full open L hand.  MAS of 1+ to 2 in L hip and L knee 5-6 beats clonus LLE Cannot test hoffman's due to curling of L hand  MS: TTP acorss L CMC joint on L hand     Assessment & Plan:    Pt is a 52 yr old female with chronic spasticity and L hemiplegia from prior stroke- 10/22- latest one; but said she's has 22 strokes in past.  and Debility related to DKA and recent rehab admission- also has aphasia and poorly controlled HTN   Here for f/u on multiple strokes and spasticity   BP much better than last time I saw her. Doesn't need additional titration of meds.   2.  For L hand/CMC joint arthritis- Try Voltaren gel- for L hand- apply small amount- can get at any drug store- is over the counter- use 4x/day for L hand.   3.  Sees [facility] PT, OT and SLP- 1x/week to extend visits as much as possible.  Don't see how I can have them see her more frequently, because will stop them coming even faster.    4. Baclofen- 5 mg 3x/day x 1 week , then 10 mg 3x/day for spasticity Can call me next month to try and increase if needed. Might need to add Zanaflex or Dantrolene if need be.   5. Can still take Flexeril, but not usually as effective for spasticity    6.  To get new shower bench today.    7. Wait on L hand xray for now- really appears to be CMC arthritis.     8. Needs resting hand splint- will order for pt.    9. F/U on CVA and spasticity in 3 months-    I spent a total of  34  minutes on total care  today- >50% coordination of care- due to discussing spasticity and how to treat will order new hand splint- and how to treat L hand arthritis.

## 2022-04-10 ENCOUNTER — Telehealth: Payer: Self-pay

## 2022-04-10 NOTE — Telephone Encounter (Signed)
Lori [name] with [facility] called requesting verbal orders to extend speech therapy for 1 time a week for 8 weeks.  Verbal orders given

## 2022-04-10 NOTE — Telephone Encounter (Signed)
[name] with [facility] called requesting verbal orders for U/A due to patient having burning on urination. Verbal orders were given.

## 2022-04-12 ENCOUNTER — Telehealth: Payer: Self-pay

## 2022-04-12 NOTE — Telephone Encounter (Signed)
Awaiting for final urine culture and sensitive current report is preliminary.

## 2022-04-12 NOTE — Telephone Encounter (Signed)
Nurse with [facility] called to confirm that we received urine sample results.   Results were received and routed to [place], see media tab!

## 2022-04-17 ENCOUNTER — Telehealth: Payer: Medicare Other

## 2022-04-17 ENCOUNTER — Other Ambulatory Visit: Payer: Self-pay | Admitting: Family

## 2022-04-17 DIAGNOSIS — N390 Urinary tract infection, site not specified: Secondary | ICD-10-CM

## 2022-04-17 MED ORDER — SACCHAROMYCES BOULARDII 250 MG PO CAPS: 250.0000 mg | ORAL_CAPSULE | Freq: Two times a day (BID) | ORAL | 0 refills | Status: AC

## 2022-04-17 MED ORDER — CIPROFLOXACIN HCL 500 MG PO TABS: 500.0000 mg | ORAL_TABLET | Freq: Two times a day (BID) | ORAL | 0 refills | Status: AC

## 2022-04-17 NOTE — Telephone Encounter (Signed)
Lori [name] with [facility] called inquiring about urine culture results. She wanted to know if results have been reviewed because it looks like patient does require treatment. Please advise. Results are under media tab and were routed.  Message routed to Marlowe Sax, NP

## 2022-04-17 NOTE — Telephone Encounter (Signed)
Final urine culture indicates > 100,000 colonies of Klebsiella bacteria.Start on Cipro 500 mg tablet one by mouth twice daily x 7 days Take along with probiotics Florastor 250 mg capsule one by mouth twice daily x 10 days to prevent antibiotics associated diarrhea  Will send antibiotics to pharmacy.

## 2022-04-18 ENCOUNTER — Other Ambulatory Visit: Payer: Self-pay

## 2022-04-18 MED ORDER — SULFAMETHOXAZOLE-TRIMETHOPRIM 800-160 MG PO TABS: 1.0000 | ORAL_TABLET | Freq: Two times a day (BID) | ORAL | 0 refills | Status: DC

## 2022-04-18 NOTE — Telephone Encounter (Signed)
Please add Cipro to allergies list as reported by patient. Start on bactrim DS 800 - 160 mg tablet one by mouth twice daily x 7 days

## 2022-04-18 NOTE — Telephone Encounter (Signed)
Lori [name] notified and will let patient know. Patient's phone is messing up at this time.

## 2022-04-18 NOTE — Telephone Encounter (Signed)
Lori [name] called back and stated that patient said she is allergic to cipro. Please send in something else.

## 2022-04-18 NOTE — Telephone Encounter (Signed)
I called and left detailed message for [name] with the change in medication and directions.

## 2022-04-19 ENCOUNTER — Telehealth: Payer: Self-pay

## 2022-04-19 NOTE — Telephone Encounter (Signed)
[name] with [facility] called stating that patient complained of not sleeping at all last night. Patient request zolpidem, which she said work for her in the past.  Message routed to [employer], NP

## 2022-04-19 NOTE — Telephone Encounter (Signed)
Recommend melatonin 3-6 mg tablet a needed for sleep

## 2022-04-19 NOTE — Telephone Encounter (Signed)
[name] notified and will communicate with patient.

## 2022-04-19 NOTE — Telephone Encounter (Signed)
Lori [name] with [facility] called stating that she was entering newly prescribed medication in her system and some of the medications came up as having interactions. Benicar interacts with bactrim, and escitalopram interacts with hydroxyzine. She also wanted to know if patient should be on baclofen and flxeril. I informed her the should may need to call the provider that prescribed both of these medications. Please advise.  Message routed to Marlowe Sax, NP

## 2022-04-22 NOTE — Progress Notes (Deleted)
Office Visit    Patient Name: Lori [name] Date of Encounter: 04/22/2022  Primary Care Provider:  [name], NP Primary Cardiologist:  Early Osmond, MD  Chief Complaint    Hypertension - [facility] hypertension clinic  Past Medical History   CAD s/p CABG in 2018  Recurrent stroke clopidogrel 75 mg daily  HLD atorvastatin 80, ezetimibe 10 mg daily; LDL 70  DM novolog 7 units TID and lantus 38 units at bedtime; A1c 10.5  Aortic atherosclerosis   Carotid stenosis   pheochromocytoma post adrenalectomy  hyperthyroidism methimazole 5 mg daily  Anxiety/depression escitalopram 20 mg, hydroxyzine 25 mg PRN, lorazepam 0.5 mg PRN  neuropathy topiramate 25 mg BID, gabapentin 100 mg TID    Allergies  Allergen Reactions   Contrast Media [Iodinated Contrast Media] Anaphylaxis and Swelling   Sumatriptan Anaphylaxis   Aspirin Swelling   Ciprofloxacin    Morphine Swelling   Penicillins Swelling    Mouth swells up and eyes swollen shut    History of Present Illness    Lori [name] is a 52 y.o. female patient who was referred to the [facility] for follow up. She saw Dr. Oval Linsey in December and her BP was 166/112. Amlodipine 10 mg and olmesartan 40 mg were continued, HCTZ was increased to 25 mg daily.  I then saw her in January, at which time her pressure was 131/93.  She was not compliant with medications, often being asleep at times her medications were due or not having someone (day nurse or significant other) present to help.  She was asked to work on compliance and no medication changes were made.  She returns today for follow up.  Since I saw her last she saw Dr. Ali Lowe and had lipid labs drawn.  LDL was elevated at 79, as was Lp(a) at 180.2.  Her LDL goal is < 55 due to history of recurrent strokes.    Blood Pressure Goal: 130/80  Current Medications: amlodipine 10 mg daily, olmesartan/HCTZ 40/25, metoprolol tartrate 100 mg  BID   Adherence Assessment  Do you ever forget to take your medication? [x] Yes [] No  Do you ever skip doses due to side effects? [] Yes [] No  Do you have trouble affording your medicines? [] Yes [x] No  Are you ever unable to pick up your medication due to transportation difficulties? [x] Yes - dose pack mailed, but has wrong address - advised pt to fix ASAP [] No  Do you ever stop taking your medications because you don't believe they are helping? [] Yes [] No  Do you check your weight daily? [] Yes [] No   Adherence strategy: pill pack - still missing multiple doses/week  Previously tried: lisinopril  Family Hx: mother with HTN; father with HTN and previous heart attack  Social Hx: Tobacco: none Alcohol: none Caffeine: 1 cup of coffee occasionally  Diet: Used to eat out all the time but starting to cook more at home after recent move, does not add salt to meals, likes all vegetables, eats all meats except pork, snacks on cookies  Exercise: unable to get much exercise due to recurrent stroke  Home BP readings: Did not provide log; usually ranges from 114-130/61-77   Accessory Clinical Findings    Lab Results  Component Value Date   CREATININE 0.55 11/06/2021   BUN 19 11/06/2021   NA 139 11/06/2021   K 3.5 11/06/2021   CL 112 (H) 11/06/2021   CO2 21 (L) 11/06/2021   Lab Results  Component Value Date  ALT 13 03/01/2022   AST 16 03/01/2022   ALKPHOS 117 03/01/2022   BILITOT 0.4 03/01/2022   Lab Results  Component Value Date   HGBA1C 10.5 (H) 10/23/2021    Screening for Secondary Hypertension:      01/23/2022    3:22 PM  Causes  Drugs/Herbals Screened    Relevant Labs/Studies:    Latest Ref Rng & Units 11/06/2021    3:07 AM 11/05/2021    2:59 AM 11/04/2021    3:17 AM  Basic Labs  Sodium 135 - 145 mmol/L 139  138  137   Potassium 3.5 - 5.1 mmol/L 3.5  3.3  3.9   Creatinine 0.44 - 1.00 mg/dL 0.55  0.69  0.64        Latest Ref Rng & Units 11/02/2021     2:46 AM 10/23/2021   11:37 AM  Thyroid   TSH 0.350 - 4.500 uIU/mL 1.399  0.93                   Home Medications    Current Outpatient Medications  Medication Sig Dispense Refill   albuterol (PROVENTIL) (2.5 MG/3ML) 0.083% nebulizer solution Take 3 mLs (2.5 mg total) by nebulization every 6 (six) hours as needed for wheezing or shortness of breath. 75 mL 12   amLODipine (NORVASC) 10 MG tablet Take 1 tablet (10 mg total) by mouth daily. 30 tablet 0   atorvastatin (LIPITOR) 80 MG tablet Take 80 mg by mouth daily.     baclofen (LIORESAL) 10 MG tablet Take 0.5 tablets (5 mg total) by mouth 3 (three) times daily. X 1 week, then 10 mg TID-for spasticity- can increase  the dose if you call me to discuss 30 each 0   blood glucose meter kit and supplies KIT Dispense based on patient and insurance preference. Use up to four times daily as directed. 1 each 0   budesonide (PULMICORT) 0.25 MG/2ML nebulizer solution Take 2 mLs (0.25 mg total) by nebulization 2 (two) times daily. 60 mL 12   ciprofloxacin (CIPRO) 500 MG tablet Take 1 tablet (500 mg total) by mouth 2 (two) times daily for 7 days. 14 tablet 0   clopidogrel (PLAVIX) 75 MG tablet Take 75 mg by mouth daily.     Continuous Blood Gluc Receiver (DEXCOM G5 RECEIVER KIT) DEVI 1 Device by Does not apply route 4 (four) times daily. 1 each 5   cyclobenzaprine (FLEXERIL) 5 MG tablet TAKE 1 TABLET(5 MG) BY MOUTH TWICE DAILY FOR UP TO 7 DAYS AS NEEDED FOR MUSCLE SPASMS 30 tablet 3   diclofenac Sodium (VOLTAREN) 1 % GEL Apply 4 g topically 4 (four) times daily. 200 g 0   diphenhydrAMINE (BENADRYL) 25 MG tablet Take 25 mg by mouth at bedtime.     escitalopram (LEXAPRO) 20 MG tablet Take 1 tablet (20 mg total) by mouth every morning. 30 tablet 8   ezetimibe (ZETIA) 10 MG tablet Take 1 tablet (10 mg total) by mouth daily. 90 tablet 1   gabapentin (NEURONTIN) 100 MG capsule TAKE ONE CAPSULE BY MOUTH THREE TIMES DAILY FOR NEUROPATHY 90 capsule 1    guaiFENesin (MUCINEX) 600 MG 12 hr tablet Take 1 tablet (600 mg total) by mouth 2 (two) times daily. 30 tablet 0   hydrOXYzine (ATARAX) 25 MG tablet Take 1 tablet (25 mg total) by mouth at bedtime as needed for anxiety. 30 tablet 0   insulin aspart (NOVOLOG) 100 UNIT/ML FlexPen Inject 7 Units into the skin 3 (three)  times daily with meals. 15 mL 5   insulin glargine (LANTUS) 100 UNIT/ML Solostar Pen Inject 38 Units into the skin at bedtime.     Insulin Pen Needle 32G X 4 MM MISC Use to inject insulin 4 times daily as directed. 200 each 0   ipratropium-albuterol (DUONEB) 0.5-2.5 (3) MG/3ML SOLN Take 3 mLs by nebulization 2 (two) times daily. 360 mL 5   LORazepam (ATIVAN) 0.5 MG tablet Take 1 tablet (0.5 mg total) by mouth at bedtime. 30 tablet 5   MELATONIN GUMMIES PO Take 9 mg by mouth at bedtime as needed.     methimazole (TAPAZOLE) 5 MG tablet TAKE 1 TABLET BY MOUTH EVERY MORNING 90 tablet 2   metoCLOPramide (REGLAN) 10 MG/10ML SOLN Take 5 mLs (5 mg total) by mouth 4 (four) times daily -  before meals and at bedtime for 14 days. 280 mL 0   metoprolol tartrate (LOPRESSOR) 100 MG tablet TAKE 1 TABLET BY MOUTH TWICE DAILY (BREAKFAST, BEDTIME) 180 tablet 2   nystatin cream (MYCOSTATIN) Apply 1 Application topically 2 (two) times daily. Affected areas on breast fold and groin and sacral areas 30 g 0   olmesartan-hydrochlorothiazide (BENICAR HCT) 40-25 MG tablet Take 1 tablet by mouth daily. 90 tablet 3   pantoprazole (PROTONIX) 40 MG tablet Take 1 tablet (40 mg total) by mouth every morning. 90 tablet 2   polyethylene glycol powder (GLYCOLAX/MIRALAX) 17 GM/SCOOP powder Take 17 g by mouth daily. Hold for loose stool 3350 g 1   saccharomyces boulardii (FLORASTOR) 250 MG capsule Take 1 capsule (250 mg total) by mouth 2 (two) times daily for 10 days. 20 capsule 0   senna (SENOKOT) 8.6 MG tablet Take 8.6 mg by mouth as needed for constipation.     sulfamethoxazole-trimethoprim (BACTRIM DS) 800-160 MG tablet  Take 1 tablet by mouth 2 (two) times daily. 14 tablet 0   tamsulosin (FLOMAX) 0.4 MG CAPS capsule TAKE 1 CAPSULE BY MOUTH AT BEDTIME 90 capsule 2   topiramate (TOPAMAX) 25 MG tablet TAKE 1 TABLET BY MOUTH TWICE DAILY FOR NEUROPATHY 60 tablet 3   zinc oxide (BALMEX) 11.3 % CREA cream Apply 1 Application topically 2 (two) times daily. 113 g 5   No current facility-administered medications for this visit.     Assessment & Plan   BP 141/105   Repeat  131/93  No problem-specific Assessment & Plan notes found for this encounter.    Tommy Medal PharmD [facility]  [address] [place], [place] [address] [phone]

## 2022-04-23 ENCOUNTER — Telehealth: Payer: Self-pay

## 2022-04-23 ENCOUNTER — Ambulatory Visit (HOSPITAL_BASED_OUTPATIENT_CLINIC_OR_DEPARTMENT_OTHER): Payer: Medicaid Other

## 2022-04-23 NOTE — Telephone Encounter (Signed)
[facility] assistant called and left message on clinical intake voicemail. She says that patient fell on Sunday, transferring from car with husband. She says patient reported no injuries. Patient only said that her back and knee are sore. There were no abrasions when she saw patient yesterday. This call was just FYI.  Message sent to Marlowe Sax, NP

## 2022-04-23 NOTE — Telephone Encounter (Signed)
Noted  

## 2022-04-25 ENCOUNTER — Other Ambulatory Visit: Payer: Self-pay

## 2022-04-26 ENCOUNTER — Other Ambulatory Visit: Payer: Self-pay | Admitting: Family

## 2022-04-26 ENCOUNTER — Other Ambulatory Visit: Payer: Self-pay | Admitting: Family Medicine

## 2022-04-26 ENCOUNTER — Other Ambulatory Visit (HOSPITAL_BASED_OUTPATIENT_CLINIC_OR_DEPARTMENT_OTHER): Payer: Self-pay | Admitting: Cardiovascular Disease

## 2022-04-26 DIAGNOSIS — I10 Essential (primary) hypertension: Secondary | ICD-10-CM

## 2022-04-26 DIAGNOSIS — F32A Anxiety disorder, unspecified: Secondary | ICD-10-CM

## 2022-04-26 DIAGNOSIS — R051 Acute cough: Secondary | ICD-10-CM

## 2022-04-29 NOTE — Telephone Encounter (Signed)
Not a provider [facility] fills Rx for Requested Prescriptions  Pending Prescriptions Disp Refills   atorvastatin (LIPITOR) 80 MG tablet [Pharmacy Med Name: ATORVASTATIN 80MG  TABLETS] 90 tablet     Sig: TAKE 1 TABLET(80 MG) BY MOUTH DAILY     Cardiovascular:  Antilipid - Statins Failed - 04/26/2022  7:08 PM      Failed - Valid encounter within last 12 months    Recent Outpatient Visits           1 year ago Type 2 diabetes mellitus with hyperlipidemia [facility])   [place], Charlane Ferretti, MD              Failed - Lipid Panel in normal range within the last 12 months    Cholesterol, Total  Date Value Ref Range Status  03/01/2022 148 100 - 199 mg/dL Final   LDL Cholesterol (Calc)  Date Value Ref Range Status  10/23/2021 70 mg/dL (calc) Final    Comment:    Reference range: <100 . Desirable range <100 mg/dL for primary prevention;   <70 mg/dL for patients with CHD or diabetic patients  with > or = 2 CHD risk factors. Marland Kitchen LDL-C is now calculated using the Martin-Hopkins  calculation, which is a validated novel method providing  better accuracy than the Friedewald equation in the  estimation of LDL-C.  Cresenciano Genre et al. Annamaria Helling. [id number]): [id number]  (http://education.[employer].com/faq/FAQ164)    LDL Chol Calc (NIH)  Date Value Ref Range Status  03/01/2022 79 0 - 99 mg/dL Final   HDL  Date Value Ref Range Status  03/01/2022 44 >39 mg/dL Final   Triglycerides  Date Value Ref Range Status  03/01/2022 143 0 - 149 mg/dL Final         Passed - Patient is not pregnant

## 2022-04-29 NOTE — Telephone Encounter (Signed)
In reference to Lorazepam refill:  Last refilled on 01/08/2022, #30/5 refills, and treatment agreement is out dated. Mychart message sent to patient to prompt her to schedule a routine follow-up with Dinah (at that time we will update treatment agreement)

## 2022-04-30 ENCOUNTER — Other Ambulatory Visit: Payer: Self-pay

## 2022-04-30 ENCOUNTER — Emergency Department (HOSPITAL_COMMUNITY): Payer: Medicare Other

## 2022-04-30 ENCOUNTER — Encounter (HOSPITAL_COMMUNITY): Payer: Self-pay

## 2022-04-30 ENCOUNTER — Inpatient Hospital Stay (HOSPITAL_COMMUNITY)
Admission: EM | Admit: 2022-04-30 | Discharge: 2022-05-03 | DRG: 682 | Disposition: A | Discharge status: Home Health | Payer: Medicare Other | Attending: Internal Medicine | Admitting: Internal Medicine

## 2022-04-30 DIAGNOSIS — I69320 Aphasia following cerebral infarction: Secondary | ICD-10-CM

## 2022-04-30 DIAGNOSIS — E039 Hypothyroidism, unspecified: Secondary | ICD-10-CM | POA: Diagnosis present

## 2022-04-30 DIAGNOSIS — Z90710 Acquired absence of both cervix and uterus: Secondary | ICD-10-CM

## 2022-04-30 DIAGNOSIS — F419 Anxiety disorder, unspecified: Secondary | ICD-10-CM | POA: Diagnosis present

## 2022-04-30 DIAGNOSIS — E875 Hyperkalemia: Principal | ICD-10-CM | POA: Insufficient documentation

## 2022-04-30 DIAGNOSIS — I69392 Facial weakness following cerebral infarction: Secondary | ICD-10-CM

## 2022-04-30 DIAGNOSIS — E860 Dehydration: Secondary | ICD-10-CM | POA: Diagnosis present

## 2022-04-30 DIAGNOSIS — K219 Gastro-esophageal reflux disease without esophagitis: Secondary | ICD-10-CM | POA: Diagnosis present

## 2022-04-30 DIAGNOSIS — F32A Depression, unspecified: Secondary | ICD-10-CM | POA: Diagnosis present

## 2022-04-30 DIAGNOSIS — E7849 Other hyperlipidemia: Secondary | ICD-10-CM

## 2022-04-30 DIAGNOSIS — Y92009 Unspecified place in unspecified non-institutional (private) residence as the place of occurrence of the external cause: Secondary | ICD-10-CM

## 2022-04-30 DIAGNOSIS — I69354 Hemiplegia and hemiparesis following cerebral infarction affecting left non-dominant side: Secondary | ICD-10-CM

## 2022-04-30 DIAGNOSIS — T50995A Adverse effect of other drugs, medicaments and biological substances, initial encounter: Secondary | ICD-10-CM | POA: Diagnosis present

## 2022-04-30 DIAGNOSIS — Z794 Long term (current) use of insulin: Secondary | ICD-10-CM

## 2022-04-30 DIAGNOSIS — Z7951 Long term (current) use of inhaled steroids: Secondary | ICD-10-CM

## 2022-04-30 DIAGNOSIS — N179 Acute kidney failure, unspecified: Principal | ICD-10-CM | POA: Diagnosis present

## 2022-04-30 DIAGNOSIS — E119 Type 2 diabetes mellitus without complications: Secondary | ICD-10-CM | POA: Diagnosis present

## 2022-04-30 DIAGNOSIS — Z8673 Personal history of transient ischemic attack (TIA), and cerebral infarction without residual deficits: Secondary | ICD-10-CM

## 2022-04-30 DIAGNOSIS — Z832 Family history of diseases of the blood and blood-forming organs and certain disorders involving the immune mechanism: Secondary | ICD-10-CM

## 2022-04-30 DIAGNOSIS — I639 Cerebral infarction, unspecified: Secondary | ICD-10-CM | POA: Diagnosis present

## 2022-04-30 DIAGNOSIS — Z808 Family history of malignant neoplasm of other organs or systems: Secondary | ICD-10-CM

## 2022-04-30 DIAGNOSIS — Z8249 Family history of ischemic heart disease and other diseases of the circulatory system: Secondary | ICD-10-CM

## 2022-04-30 DIAGNOSIS — E1165 Type 2 diabetes mellitus with hyperglycemia: Secondary | ICD-10-CM | POA: Diagnosis present

## 2022-04-30 DIAGNOSIS — E871 Hypo-osmolality and hyponatremia: Secondary | ICD-10-CM | POA: Insufficient documentation

## 2022-04-30 DIAGNOSIS — R5383 Other fatigue: Secondary | ICD-10-CM

## 2022-04-30 DIAGNOSIS — Z1152 Encounter for screening for COVID-19: Secondary | ICD-10-CM

## 2022-04-30 DIAGNOSIS — Z79899 Other long term (current) drug therapy: Secondary | ICD-10-CM

## 2022-04-30 DIAGNOSIS — I6930 Unspecified sequelae of cerebral infarction: Secondary | ICD-10-CM

## 2022-04-30 DIAGNOSIS — Z951 Presence of aortocoronary bypass graft: Secondary | ICD-10-CM

## 2022-04-30 DIAGNOSIS — E1169 Type 2 diabetes mellitus with other specified complication: Secondary | ICD-10-CM | POA: Diagnosis present

## 2022-04-30 DIAGNOSIS — E669 Obesity, unspecified: Secondary | ICD-10-CM | POA: Diagnosis present

## 2022-04-30 DIAGNOSIS — Z833 Family history of diabetes mellitus: Secondary | ICD-10-CM

## 2022-04-30 DIAGNOSIS — G929 Unspecified toxic encephalopathy: Secondary | ICD-10-CM | POA: Diagnosis present

## 2022-04-30 DIAGNOSIS — E0590 Thyrotoxicosis, unspecified without thyrotoxic crisis or storm: Secondary | ICD-10-CM | POA: Diagnosis present

## 2022-04-30 DIAGNOSIS — Z6837 Body mass index (BMI) 37.0-37.9, adult: Secondary | ICD-10-CM

## 2022-04-30 DIAGNOSIS — Z825 Family history of asthma and other chronic lower respiratory diseases: Secondary | ICD-10-CM

## 2022-04-30 DIAGNOSIS — Z993 Dependence on wheelchair: Secondary | ICD-10-CM

## 2022-04-30 DIAGNOSIS — K5900 Constipation, unspecified: Secondary | ICD-10-CM | POA: Diagnosis present

## 2022-04-30 DIAGNOSIS — I10 Essential (primary) hypertension: Secondary | ICD-10-CM | POA: Diagnosis present

## 2022-04-30 DIAGNOSIS — E1143 Type 2 diabetes mellitus with diabetic autonomic (poly)neuropathy: Secondary | ICD-10-CM | POA: Diagnosis present

## 2022-04-30 DIAGNOSIS — J9811 Atelectasis: Secondary | ICD-10-CM | POA: Diagnosis present

## 2022-04-30 DIAGNOSIS — Z7401 Bed confinement status: Secondary | ICD-10-CM

## 2022-04-30 DIAGNOSIS — I2510 Atherosclerotic heart disease of native coronary artery without angina pectoris: Secondary | ICD-10-CM | POA: Diagnosis present

## 2022-04-30 DIAGNOSIS — E1140 Type 2 diabetes mellitus with diabetic neuropathy, unspecified: Secondary | ICD-10-CM | POA: Diagnosis present

## 2022-04-30 DIAGNOSIS — G8114 Spastic hemiplegia affecting left nondominant side: Secondary | ICD-10-CM

## 2022-04-30 DIAGNOSIS — Z7902 Long term (current) use of antithrombotics/antiplatelets: Secondary | ICD-10-CM

## 2022-04-30 DIAGNOSIS — E785 Hyperlipidemia, unspecified: Secondary | ICD-10-CM | POA: Diagnosis present

## 2022-04-30 DIAGNOSIS — M25512 Pain in left shoulder: Secondary | ICD-10-CM | POA: Diagnosis present

## 2022-04-30 HISTORY — DX: Acute kidney failure, unspecified: N17.9

## 2022-04-30 LAB — COMPREHENSIVE METABOLIC PANEL
ALT: 17 U/L (ref 0–44)
AST: 13 U/L — ABNORMAL LOW (ref 15–41)
Albumin: 3.8 g/dL (ref 3.5–5.0)
Alkaline Phosphatase: 110 U/L (ref 38–126)
Anion gap: 14 (ref 5–15)
BUN: 66 mg/dL — ABNORMAL HIGH (ref 6–20)
CO2: 19 mmol/L — ABNORMAL LOW (ref 22–32)
Calcium: 9.3 mg/dL (ref 8.9–10.3)
Chloride: 95 mmol/L — ABNORMAL LOW (ref 98–111)
Creatinine, Ser: 2.71 mg/dL — ABNORMAL HIGH (ref 0.44–1.00)
GFR, Estimated: 21 mL/min — ABNORMAL LOW (ref >60)
Glucose, Bld: 313 mg/dL — ABNORMAL HIGH (ref 70–99)
Potassium: 6.9 mmol/L (ref 3.5–5.1)
Sodium: 128 mmol/L — ABNORMAL LOW (ref 135–145)
Total Bilirubin: 0.7 mg/dL (ref 0.3–1.2)
Total Protein: 7.5 g/dL (ref 6.5–8.1)

## 2022-04-30 LAB — RESP PANEL BY RT-PCR (RSV, FLU A&B, COVID)  RVPGX2
Influenza A by PCR: NEGATIVE
Influenza B by PCR: NEGATIVE
Resp Syncytial Virus by PCR: NEGATIVE
SARS Coronavirus 2 by RT PCR: NEGATIVE

## 2022-04-30 LAB — URINALYSIS, ROUTINE W REFLEX MICROSCOPIC
Bilirubin Urine: NEGATIVE
Glucose, UA: NEGATIVE mg/dL
Hgb urine dipstick: NEGATIVE
Ketones, ur: NEGATIVE mg/dL
Nitrite: NEGATIVE
Protein, ur: NEGATIVE mg/dL
Specific Gravity, Urine: 1.011 (ref 1.005–1.030)
pH: 5 (ref 5.0–8.0)

## 2022-04-30 LAB — I-STAT CHEM 8, ED
BUN: 64 mg/dL — ABNORMAL HIGH (ref 6–20)
Calcium, Ion: 1.13 mmol/L — ABNORMAL LOW (ref 1.15–1.40)
Chloride: 99 mmol/L (ref 98–111)
Creatinine, Ser: 2.8 mg/dL — ABNORMAL HIGH (ref 0.44–1.00)
Glucose, Bld: 288 mg/dL — ABNORMAL HIGH (ref 70–99)
HCT: 42 % (ref 36.0–46.0)
Hemoglobin: 14.3 g/dL (ref 12.0–15.0)
Potassium: 7 mmol/L (ref 3.5–5.1)
Sodium: 130 mmol/L — ABNORMAL LOW (ref 135–145)
TCO2: 25 mmol/L (ref 22–32)

## 2022-04-30 LAB — I-STAT VENOUS BLOOD GAS, ED
Acid-base deficit: 3 mmol/L — ABNORMAL HIGH (ref 0.0–2.0)
Bicarbonate: 24.6 mmol/L (ref 20.0–28.0)
Calcium, Ion: 1.17 mmol/L (ref 1.15–1.40)
HCT: 42 % (ref 36.0–46.0)
Hemoglobin: 14.3 g/dL (ref 12.0–15.0)
O2 Saturation: 84 %
Potassium: 7 mmol/L (ref 3.5–5.1)
Sodium: 129 mmol/L — ABNORMAL LOW (ref 135–145)
TCO2: 26 mmol/L (ref 22–32)
pCO2, Ven: 53.4 mmHg (ref 44–60)
pH, Ven: 7.272 (ref 7.25–7.43)
pO2, Ven: 56 mmHg — ABNORMAL HIGH (ref 32–45)

## 2022-04-30 LAB — CBC WITH DIFFERENTIAL/PLATELET
Abs Immature Granulocytes: 0.03 10*3/uL (ref 0.00–0.07)
Basophils Absolute: 0 10*3/uL (ref 0.0–0.1)
Basophils Relative: 1 %
Eosinophils Absolute: 0.1 10*3/uL (ref 0.0–0.5)
Eosinophils Relative: 1 %
HCT: 40.7 % (ref 36.0–46.0)
Hemoglobin: 13.6 g/dL (ref 12.0–15.0)
Immature Granulocytes: 1 %
Lymphocytes Relative: 28 %
Lymphs Abs: 1.9 10*3/uL (ref 0.7–4.0)
MCH: 25.9 pg — ABNORMAL LOW (ref 26.0–34.0)
MCHC: 33.4 g/dL (ref 30.0–36.0)
MCV: 77.5 fL — ABNORMAL LOW (ref 80.0–100.0)
Monocytes Absolute: 0.3 10*3/uL (ref 0.1–1.0)
Monocytes Relative: 4 %
Neutro Abs: 4.4 10*3/uL (ref 1.7–7.7)
Neutrophils Relative %: 65 %
Platelets: 92 10*3/uL — ABNORMAL LOW (ref 150–400)
RBC: 5.25 MIL/uL — ABNORMAL HIGH (ref 3.87–5.11)
RDW: 15.1 % (ref 11.5–15.5)
WBC: 6.7 10*3/uL (ref 4.0–10.5)
nRBC: 0 % (ref 0.0–0.2)

## 2022-04-30 LAB — BLOOD GAS, VENOUS
Acid-base deficit: 1.1 mmol/L (ref 0.0–2.0)
Bicarbonate: 24.8 mmol/L (ref 20.0–28.0)
O2 Saturation: 62.9 %
Patient temperature: 36.3
pCO2, Ven: 44 mmHg (ref 44–60)
pH, Ven: 7.36 (ref 7.25–7.43)
pO2, Ven: 34 mmHg (ref 32–45)

## 2022-04-30 LAB — TROPONIN I (HIGH SENSITIVITY)
Troponin I (High Sensitivity): 4 ng/L (ref <18)
Troponin I (High Sensitivity): 4 ng/L (ref <18)

## 2022-04-30 LAB — BASIC METABOLIC PANEL
Anion gap: 15 (ref 5–15)
BUN: 61 mg/dL — ABNORMAL HIGH (ref 6–20)
CO2: 18 mmol/L — ABNORMAL LOW (ref 22–32)
Calcium: 9.5 mg/dL (ref 8.9–10.3)
Chloride: 99 mmol/L (ref 98–111)
Creatinine, Ser: 2.15 mg/dL — ABNORMAL HIGH (ref 0.44–1.00)
GFR, Estimated: 28 mL/min — ABNORMAL LOW (ref >60)
Glucose, Bld: 177 mg/dL — ABNORMAL HIGH (ref 70–99)
Potassium: 6.5 mmol/L (ref 3.5–5.1)
Sodium: 132 mmol/L — ABNORMAL LOW (ref 135–145)

## 2022-04-30 LAB — POTASSIUM: Potassium: 7.3 mmol/L (ref 3.5–5.1)

## 2022-04-30 LAB — BRAIN NATRIURETIC PEPTIDE: B Natriuretic Peptide: 26.3 pg/mL (ref 0.0–100.0)

## 2022-04-30 LAB — HIV ANTIBODY (ROUTINE TESTING W REFLEX): HIV Screen 4th Generation wRfx: NONREACTIVE

## 2022-04-30 LAB — CBG MONITORING, ED
Glucose-Capillary: 224 mg/dL — ABNORMAL HIGH (ref 70–99)
Glucose-Capillary: 274 mg/dL — ABNORMAL HIGH (ref 70–99)
Glucose-Capillary: 200 mg/dL — ABNORMAL HIGH (ref 70–99)

## 2022-04-30 LAB — GLUCOSE, CAPILLARY: Glucose-Capillary: 194 mg/dL — ABNORMAL HIGH (ref 70–99)

## 2022-04-30 LAB — TSH: TSH: 1.865 u[IU]/mL (ref 0.350–4.500)

## 2022-04-30 MED ORDER — ALBUTEROL SULFATE (2.5 MG/3ML) 0.083% IN NEBU
10.0000 mg | INHALATION_SOLUTION | Freq: Once | RESPIRATORY_TRACT | Status: AC
  Administered 2022-04-30: 10 mg via RESPIRATORY_TRACT
  Filled 2022-04-30: qty 12

## 2022-04-30 MED ORDER — INSULIN ASPART 100 UNIT/ML IJ SOLN
0.0000 [IU] | Freq: Every day | INTRAMUSCULAR | Status: DC
  Administered 2022-05-01: 3 [IU] via SUBCUTANEOUS
  Administered 2022-05-03: 4 [IU] via SUBCUTANEOUS

## 2022-04-30 MED ORDER — INSULIN ASPART 100 UNIT/ML IJ SOLN: 10.0000 [IU] | Freq: Once | INTRAMUSCULAR | Status: DC

## 2022-04-30 MED ORDER — ACETAMINOPHEN 325 MG PO TABS
650.0000 mg | ORAL_TABLET | Freq: Four times a day (QID) | ORAL | Status: DC | PRN
  Administered 2022-04-30 – 2022-05-01 (×2): 650 mg via ORAL
  Filled 2022-04-30 (×2): qty 2

## 2022-04-30 MED ORDER — LACTATED RINGERS IV BOLUS
1000.0000 mL | Freq: Once | INTRAVENOUS | Status: AC
  Administered 2022-04-30: 1000 mL via INTRAVENOUS

## 2022-04-30 MED ORDER — ENOXAPARIN SODIUM 30 MG/0.3ML IJ SOSY: 30.0000 mg | PREFILLED_SYRINGE | INTRAMUSCULAR | Status: DC

## 2022-04-30 MED ORDER — SODIUM ZIRCONIUM CYCLOSILICATE 10 G PO PACK
10.0000 g | PACK | Freq: Four times a day (QID) | ORAL | Status: AC
  Administered 2022-04-30 – 2022-05-01 (×3): 10 g via ORAL
  Filled 2022-04-30 (×3): qty 1

## 2022-04-30 MED ORDER — METOPROLOL TARTRATE 100 MG PO TABS
100.0000 mg | ORAL_TABLET | Freq: Two times a day (BID) | ORAL | Status: DC
  Administered 2022-05-01 – 2022-05-03 (×4): 100 mg via ORAL
  Filled 2022-04-30 (×5): qty 1

## 2022-04-30 MED ORDER — ACETAMINOPHEN 650 MG RE SUPP: 650.0000 mg | Freq: Four times a day (QID) | RECTAL | Status: DC | PRN

## 2022-04-30 MED ORDER — SODIUM CHLORIDE 0.9 % IV SOLN: INTRAVENOUS | Status: AC

## 2022-04-30 MED ORDER — NYSTATIN 100000 UNIT/GM EX CREA
1.0000 | TOPICAL_CREAM | Freq: Two times a day (BID) | CUTANEOUS | Status: DC
  Administered 2022-04-30 – 2022-05-03 (×6): 1 via TOPICAL
  Filled 2022-04-30 (×2): qty 30

## 2022-04-30 MED ORDER — EZETIMIBE 10 MG PO TABS
10.0000 mg | ORAL_TABLET | Freq: Every day | ORAL | Status: DC
  Administered 2022-05-01 – 2022-05-03 (×3): 10 mg via ORAL
  Filled 2022-04-30 (×3): qty 1

## 2022-04-30 MED ORDER — INSULIN GLARGINE-YFGN 100 UNIT/ML ~~LOC~~ SOLN
20.0000 [IU] | Freq: Every day | SUBCUTANEOUS | Status: DC
  Administered 2022-05-01: 20 [IU] via SUBCUTANEOUS
  Filled 2022-04-30 (×2): qty 0.2

## 2022-04-30 MED ORDER — SODIUM CHLORIDE 0.9% FLUSH
3.0000 mL | Freq: Two times a day (BID) | INTRAVENOUS | Status: DC
  Administered 2022-05-01 – 2022-05-03 (×5): 3 mL via INTRAVENOUS

## 2022-04-30 MED ORDER — CALCIUM GLUCONATE-NACL 1-0.675 GM/50ML-% IV SOLN
1.0000 g | Freq: Once | INTRAVENOUS | Status: AC
  Administered 2022-04-30: 1000 mg via INTRAVENOUS
  Filled 2022-04-30: qty 50

## 2022-04-30 MED ORDER — ATORVASTATIN CALCIUM 80 MG PO TABS
80.0000 mg | ORAL_TABLET | Freq: Every day | ORAL | Status: DC
  Administered 2022-05-01 – 2022-05-03 (×3): 80 mg via ORAL
  Filled 2022-04-30 (×3): qty 1

## 2022-04-30 MED ORDER — INSULIN ASPART 100 UNIT/ML IV SOLN
5.0000 [IU] | Freq: Once | INTRAVENOUS | Status: AC
  Administered 2022-04-30: 5 [IU] via INTRAVENOUS

## 2022-04-30 MED ORDER — ALBUTEROL SULFATE (2.5 MG/3ML) 0.083% IN NEBU: 2.5000 mg | INHALATION_SOLUTION | Freq: Four times a day (QID) | RESPIRATORY_TRACT | Status: DC | PRN

## 2022-04-30 MED ORDER — INSULIN ASPART 100 UNIT/ML IJ SOLN
0.0000 [IU] | Freq: Three times a day (TID) | INTRAMUSCULAR | Status: DC
  Administered 2022-05-01: 3 [IU] via SUBCUTANEOUS
  Administered 2022-05-01: 2 [IU] via SUBCUTANEOUS
  Administered 2022-05-01: 3 [IU] via SUBCUTANEOUS
  Administered 2022-05-02: 5 [IU] via SUBCUTANEOUS
  Administered 2022-05-02: 7 [IU] via SUBCUTANEOUS
  Administered 2022-05-02 – 2022-05-03 (×3): 5 [IU] via SUBCUTANEOUS

## 2022-04-30 MED ORDER — POLYETHYLENE GLYCOL 3350 17 G PO PACK
17.0000 g | PACK | Freq: Every day | ORAL | Status: DC | PRN
  Administered 2022-05-03: 17 g via ORAL
  Filled 2022-04-30: qty 1

## 2022-04-30 MED ORDER — SODIUM ZIRCONIUM CYCLOSILICATE 10 G PO PACK
10.0000 g | PACK | ORAL | Status: AC
  Administered 2022-04-30: 10 g via ORAL
  Filled 2022-04-30: qty 1

## 2022-04-30 MED ORDER — GABAPENTIN 100 MG PO CAPS
100.0000 mg | ORAL_CAPSULE | Freq: Three times a day (TID) | ORAL | Status: DC
  Administered 2022-04-30 – 2022-05-03 (×8): 100 mg via ORAL
  Filled 2022-04-30 (×8): qty 1

## 2022-04-30 MED ORDER — AMLODIPINE BESYLATE 5 MG PO TABS
5.0000 mg | ORAL_TABLET | Freq: Every day | ORAL | Status: DC
  Administered 2022-05-01 – 2022-05-03 (×2): 5 mg via ORAL
  Filled 2022-04-30 (×2): qty 1

## 2022-04-30 MED ORDER — PANTOPRAZOLE SODIUM 40 MG PO TBEC
40.0000 mg | DELAYED_RELEASE_TABLET | Freq: Every morning | ORAL | Status: DC
  Administered 2022-05-01 – 2022-05-03 (×3): 40 mg via ORAL
  Filled 2022-04-30 (×3): qty 1

## 2022-04-30 MED ORDER — TAMSULOSIN HCL 0.4 MG PO CAPS
0.4000 mg | ORAL_CAPSULE | Freq: Every day | ORAL | Status: DC
  Administered 2022-05-01 – 2022-05-02 (×2): 0.4 mg via ORAL
  Filled 2022-04-30 (×2): qty 1

## 2022-04-30 MED ORDER — CLOPIDOGREL BISULFATE 75 MG PO TABS
75.0000 mg | ORAL_TABLET | Freq: Every day | ORAL | Status: DC
  Administered 2022-04-30 – 2022-05-03 (×4): 75 mg via ORAL
  Filled 2022-04-30 (×4): qty 1

## 2022-04-30 NOTE — ED Provider Notes (Signed)
[facility] 4E CV SURGICAL PROGRESSIVE CARE Provider Note  CSN: [id number] Arrival date & time: 04/30/22 1056  Chief Complaint(s) Weakness  HPI Lori [name] is a 52 y.o. female with PMH CAD, T2DM, multiple previous CVAs with chronic spasticity, aphasia and left sided deficits, pheochromocytoma who presents emergency department for evaluation of generalized weakness and lethargy.  History primarily obtained from patient's husband who states that patient does have the ability to walk but is generally bedbound due to her neurologic status.  However, she is usually awake and able to have full conversations.  He states that he started to notice her get slightly more fatigue last night but over the last 24 hours has noticed rapid decline in her functional and conversive ability.  He states that she will constantly fall asleep midsentence.  This is abnormal for this patient.  Here in the emergency room, patient endorses left shoulder pain but denies abdominal pain, nausea, vomiting or other systemic symptoms.   Past Medical History Past Medical History:  Diagnosis Date   CAD (coronary artery disease)    Depression    Diabetes mellitus without complication ([place])    History of CT scan    History of mammogram    History of MRI    Hypertension    Pheochromocytoma    Pheochromocytoma    Stroke [facility])    Thyroid disease    Patient Active Problem List   Diagnosis Date Noted   Hyponatremia 04/30/2022   Hyperkalemia 04/30/2022   AKI (acute kidney injury) ([place]) 04/30/2022   Wheelchair dependence 04/08/2022   Spasticity as late effect of cerebrovascular accident (CVA) 04/08/2022   Left hemiplegia ([place]) 04/08/2022   Dyspnea 11/02/2021   GERD (gastroesophageal reflux disease) 11/02/2021   Acute bronchitis 08/24/2021   Pseudohyponatremia 07/10/2021   Thyroid disease 07/10/2021   Functional urinary incontinence 05/17/2021   MDD (major depressive disorder), recurrent episode, severe ([place])  04/09/2021   Diabetic gastroparesis ([place]) 03/16/2021   Migraines 03/16/2021   History of cerebral infarction    Debility 02/26/2021   DKA (diabetic ketoacidosis) ([place]) 02/19/2021   Weakness    Type 2 diabetes mellitus with hyperlipidemia ([place])    Aphasia    Spell of abnormal behavior    History of CVA (cerebrovascular accident) 01/19/2021   Tachycardia 01/19/2021   CAD (coronary artery disease) 01/19/2021   Spastic hemiparesis of left 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 ([place]) 01/19/2021   Diabetes ([place])    Hyperthyroidism    Essential hypertension    Pheochromocytoma    Anxiety and depression    HLD (hyperlipidemia)    Home Medication(s) Prior to Admission medications   Medication Sig Start Date End Date Taking? Authorizing Provider  albuterol (PROVENTIL) (2.5 MG/3ML) 0.083% nebulizer solution Take 3 mLs (2.5 mg total) by nebulization every 6 (six) hours as needed for wheezing or shortness of breath. 10/31/21  Yes Ngetich, Dinah C, NP  amLODipine (NORVASC) 10 MG tablet Take 1 tablet (10 mg total) by mouth daily. Patient taking differently: Take 5 mg by mouth daily. 11/08/21 04/30/22 Yes Donne Hazel, MD  atorvastatin (LIPITOR) 80 MG tablet Take 80 mg by mouth daily. 06/25/21  Yes [provider]  budesonide (PULMICORT) 0.25 MG/2ML nebulizer solution Take 2 mLs (0.25 mg total) by nebulization 2 (two) times daily. Patient taking differently: Take 0.25 mg by nebulization 2 (two) times daily as needed. 10/31/21  Yes Ngetich, Dinah C, NP  clopidogrel (PLAVIX) 75 MG tablet Take 75 mg by mouth daily.  Yes [provider]  diclofenac Sodium (VOLTAREN) 1 % GEL Apply 4 g topically 4 (four) times daily. 03/15/21  Yes Love, Ivan Anchors, PA-C  escitalopram (LEXAPRO) 20 MG tablet Take 1 tablet (20 mg total) by mouth every morning. 01/30/22  Yes Ngetich, Dinah C, NP  ezetimibe (ZETIA) 10 MG tablet Take 1 tablet (10 mg total) by mouth daily. 05/23/21  Yes Garvin Fila, MD   gabapentin (NEURONTIN) 100 MG capsule TAKE ONE CAPSULE BY MOUTH THREE TIMES DAILY FOR NEUROPATHY Patient taking differently: Take 100 mg by mouth 3 (three) times daily. 12/24/21  Yes Ngetich, Dinah C, NP  guaiFENesin (MUCINEX) 600 MG 12 hr tablet Take 1 tablet (600 mg total) by mouth 2 (two) times daily. 01/23/22  Yes Ngetich, Dinah C, NP  hydrOXYzine (ATARAX) 25 MG tablet Take 1 tablet (25 mg total) by mouth at bedtime as needed for anxiety. 08/30/21  Yes Regalado, Belkys A, MD  insulin aspart (NOVOLOG) 100 UNIT/ML FlexPen Inject 7 Units into the skin 3 (three) times daily with meals. Patient taking differently: Inject 15 Units into the skin 3 (three) times daily with meals. 02/11/22  Yes Ngetich, Dinah C, NP  insulin glargine (LANTUS) 100 UNIT/ML Solostar Pen Inject 38 Units into the skin at bedtime.   Yes [provider]  LORazepam (ATIVAN) 0.5 MG tablet Take 1 tablet (0.5 mg total) by mouth at bedtime. 01/08/22  Yes Ngetich, Dinah C, NP  MELATONIN GUMMIES PO Take 9 mg by mouth at bedtime as needed (for sleep).   Yes [provider]  methimazole (TAPAZOLE) 5 MG tablet TAKE 1 TABLET BY MOUTH EVERY MORNING Patient taking differently: Take 5 mg by mouth in the morning. 05/23/21  Yes Ngetich, Dinah C, NP  metoprolol tartrate (LOPRESSOR) 100 MG tablet TAKE 1 TABLET BY MOUTH TWICE DAILY (BREAKFAST, BEDTIME) Patient taking differently: Take 100 mg by mouth 2 (two) times daily. 05/23/21  Yes Ngetich, Dinah C, NP  nystatin cream (MYCOSTATIN) Apply 1 Application topically 2 (two) times daily. Affected areas on breast fold and groin and sacral areas 07/31/21  Yes Ngetich, Dinah C, NP  olmesartan-hydrochlorothiazide (BENICAR HCT) 40-25 MG tablet TAKE 1 TABLET BY MOUTH DAILY 04/29/22  Yes Skeet Latch, MD  pantoprazole (PROTONIX) 40 MG tablet Take 1 tablet (40 mg total) by mouth every morning. 11/22/21  Yes Medina-Vargas, Monina C, NP  polyethylene glycol powder (GLYCOLAX/MIRALAX) 17 GM/SCOOP  powder Take 17 g by mouth daily. Hold for loose stool Patient taking differently: Take 17 g by mouth daily as needed for moderate constipation. Hold for loose stool 01/23/22  Yes Ngetich, Dinah C, NP  senna (SENOKOT) 8.6 MG tablet Take 8.6 mg by mouth daily.   Yes [provider]  tamsulosin (FLOMAX) 0.4 MG CAPS capsule TAKE 1 CAPSULE BY MOUTH AT BEDTIME Patient taking differently: Take 0.4 mg by mouth daily after supper. 05/23/21  Yes Ngetich, Dinah C, NP  topiramate (TOPAMAX) 25 MG tablet TAKE 1 TABLET BY MOUTH TWICE DAILY FOR NEUROPATHY Patient taking differently: Take 25 mg by mouth daily. 12/24/21  Yes Ngetich, Dinah C, NP  zinc oxide (BALMEX) 11.3 % CREA cream Apply 1 Application topically 2 (two) times daily. 07/31/21  Yes Ngetich, Dinah C, NP  baclofen (LIORESAL) 10 MG tablet Take 0.5 tablets (5 mg total) by mouth 3 (three) times daily. X 1 week, then 10 mg TID-for spasticity- can increase  the dose if you call me to discuss Patient taking differently: Take 10 mg by mouth 3 (three)  times daily. 04/08/22   Lovorn, Jinny Blossom, MD  blood glucose meter kit and supplies KIT Dispense based on patient and insurance preference. Use up to four times daily as directed. 03/16/21   Love, Ivan Anchors, PA-C  Continuous Blood Gluc Receiver (DEXCOM G5 RECEIVER KIT) DEVI 1 Device by Does not apply route 4 (four) times daily. 10/22/21   Ngetich, Dinah C, NP  Insulin Pen Needle 32G X 4 MM MISC Use to inject insulin 4 times daily as directed. 03/15/21   Love, Ivan Anchors, PA-C  sulfamethoxazole-trimethoprim (BACTRIM DS) 800-160 MG tablet Take 1 tablet by mouth 2 (two) times daily. Patient not taking: Reported on 04/30/2022 04/18/22   Ngetich, Nelda Bucks, NP                                                                                                                                    Past Surgical History Past Surgical History:  Procedure Laterality Date   ABDOMINAL HYSTERECTOMY     CESAREAN SECTION     3   CORONARY  ARTERY BYPASS GRAFT     Right adrenal gland removal for pheochromocytoma Right    Family History Family History  Problem Relation Age of Onset   Hypertension Mother    Diabetes Mother    Atrial fibrillation Mother    Hypertension Father    Heart attack Father    Dementia Father    Diabetes Brother    Brain cancer Brother    Asthma Daughter    Sickle cell anemia Son    Atrial fibrillation Maternal Uncle     Social History Social History   Tobacco Use   Smoking status: Never   Smokeless tobacco: Never  Vaping Use   Vaping Use: Never used  Substance Use Topics   Alcohol use: Not Currently   Drug use: Never   Allergies Contrast media [iodinated contrast media], Penicillins, Sumatriptan, Aspirin, Ciprofloxacin, and Morphine  Review of Systems Review of Systems  Constitutional:  Positive for fatigue.    Physical Exam Vital Signs  I have reviewed the triage vital signs BP 109/79 (BP Location: Left Arm)   Pulse 75   Temp 97.9 F (36.6 C) (Oral)   Resp 14   SpO2 98%   Physical Exam Vitals and nursing note reviewed.  Constitutional:      General: She is not in acute distress.    Appearance: She is well-developed. She is ill-appearing.  HENT:     Head: Normocephalic and atraumatic.  Eyes:     Conjunctiva/sclera: Conjunctivae normal.  Cardiovascular:     Rate and Rhythm: Normal rate and regular rhythm.     Heart sounds: No murmur heard. Pulmonary:     Effort: Pulmonary effort is normal. No respiratory distress.     Breath sounds: Normal breath sounds.  Abdominal:     Palpations: Abdomen is soft.     Tenderness: There is no abdominal tenderness.  Musculoskeletal:        General: No swelling.     Cervical back: Neck supple.  Skin:    General: Skin is warm and dry.     Capillary Refill: Capillary refill takes less than 2 seconds.  Neurological:     Mental Status: She is alert. Mental status is at baseline.     Cranial Nerves: Cranial nerve deficit present.      Motor: Weakness present.  Psychiatric:        Mood and Affect: Mood normal.     ED Results and Treatments Labs (all labs ordered are listed, but only abnormal results are displayed) Labs Reviewed  COMPREHENSIVE METABOLIC PANEL - Abnormal; Notable for the following components:      Result Value   Sodium 128 (*)    Potassium 6.9 (*)    Chloride 95 (*)    CO2 19 (*)    Glucose, Bld 313 (*)    BUN 66 (*)    Creatinine, Ser 2.71 (*)    AST 13 (*)    GFR, Estimated 21 (*)    All other components within normal limits  CBC WITH DIFFERENTIAL/PLATELET - Abnormal; Notable for the following components:   RBC 5.25 (*)    MCV 77.5 (*)    MCH 25.9 (*)    Platelets 92 (*)    All other components within normal limits  POTASSIUM - Abnormal; Notable for the following components:   Potassium 7.3 (*)    All other components within normal limits  I-STAT CHEM 8, ED - Abnormal; Notable for the following components:   Sodium 130 (*)    Potassium 7.0 (*)    BUN 64 (*)    Creatinine, Ser 2.80 (*)    Glucose, Bld 288 (*)    Calcium, Ion 1.13 (*)    All other components within normal limits  CBG MONITORING, ED - Abnormal; Notable for the following components:   Glucose-Capillary 274 (*)    All other components within normal limits  I-STAT VENOUS BLOOD GAS, ED - Abnormal; Notable for the following components:   pO2, Ven 56 (*)    Acid-base deficit 3.0 (*)    Sodium 129 (*)    Potassium 7.0 (*)    All other components within normal limits  CBG MONITORING, ED - Abnormal; Notable for the following components:   Glucose-Capillary 224 (*)    All other components within normal limits  CBG MONITORING, ED - Abnormal; Notable for the following components:   Glucose-Capillary 200 (*)    All other components within normal limits  RESP PANEL BY RT-PCR (RSV, FLU A&B, COVID)  RVPGX2  TSH  BRAIN NATRIURETIC PEPTIDE  CBC WITH DIFFERENTIAL/PLATELET  URINALYSIS, ROUTINE W REFLEX MICROSCOPIC  BLOOD  GAS, VENOUS  GLUCOSE, RANDOM  HIV ANTIBODY (ROUTINE TESTING W REFLEX)  BASIC METABOLIC PANEL  BASIC METABOLIC PANEL  COMPREHENSIVE METABOLIC PANEL  CBC  TROPONIN I (HIGH SENSITIVITY)  TROPONIN I (HIGH SENSITIVITY)  Radiology US RENAL  Result Date: 04/30/2022 CLINICAL DATA:  Acute kidney injury. EXAM: RENAL / URINARY TRACT ULTRASOUND COMPLETE COMPARISON:  CT AP 07/10/2021 FINDINGS: Right Kidney: Renal measurements: 10.0 x 4.4 x 3.7 cm = volume: 85.5 mL. Echogenicity within normal limits. No mass or hydronephrosis visualized. Left Kidney: Renal measurements: 9.8 x 5.3 x 5.4 cm = volume: 145.8 mL. Echogenicity within normal limits. No mass or hydronephrosis visualized. Bladder: Appears normal for degree of bladder distention. Other: Exam detail is diminished secondary to patient body habitus. IMPRESSION: No acute abnormality.  No signs of obstructive uropathy. Electronically Signed   By: Kerby Moors M.D.   On: 04/30/2022 15:43   CT Head Wo Contrast  Result Date: 04/30/2022 CLINICAL DATA:  Left-sided weakness, acute stroke suspected, previous history of stroke with left-sided weakness EXAM: CT HEAD WITHOUT CONTRAST TECHNIQUE: Contiguous axial images were obtained from the base of the skull through the vertex without intravenous contrast. RADIATION DOSE REDUCTION: This exam was performed according to the departmental dose-optimization program which includes automated exposure control, adjustment of the mA and/or kV according to patient size and/or use of iterative reconstruction technique. COMPARISON:  11/03/2021 FINDINGS: Brain: No evidence of acute infarction, hemorrhage, hydrocephalus, extra-axial collection or mass lesion/mass effect. Periventricular and deep white matter hypodensity. Unchanged small bilateral lacunar infarctions of the basal ganglia (series 3, image 20).  Unchanged encephalomalacia of the left frontal lobe (series 3, image 16) and left cerebellar hemisphere (series 3, image 26). Vascular: No hyperdense vessel or unexpected calcification. Skull: Normal. Negative for fracture or focal lesion. Sinuses/Orbits: No acute finding. Other: None. IMPRESSION: 1. No CT evidence of acute intracranial pathology. 2. Small-vessel white matter disease significantly advanced for patient age. 3. Unchanged small bilateral lacunar infarctions of the basal ganglia. Unchanged encephalomalacia of the left frontal lobe and left cerebellar hemisphere. 4. Consider MRI to more sensitively evaluate for acute diffusion restricting infarction if clinically suspected. Electronically Signed   By: Delanna Ahmadi M.D.   On: 04/30/2022 13:00   DG Chest Portable 1 View  Result Date: 04/30/2022 CLINICAL DATA:  Hypoxia EXAM: PORTABLE CHEST 1 VIEW COMPARISON:  CXR 11/03/21 FINDINGS: No pleural effusion. No pneumothorax. Low lung volumes. Left basilar atelectasis. Likely unchanged cardiac and mediastinal contours when accounting for differences in lung volumes. Status post median sternotomy. No focal airspace opacity. No radiographically apparent displaced rib fractures. Visualized upper abdomen is unremarkable. Surgical clips in the right upper quadrant IMPRESSION: Low lung volumes likely left basilar atelectasis. Electronically Signed   By: Marin Roberts M.D.   On: 04/30/2022 11:58    Pertinent labs & imaging results that were available during my care of the patient were reviewed by me and considered in my medical decision making (see MDM for details).  Medications Ordered in ED Medications  pantoprazole (PROTONIX) EC tablet 40 mg (has no administration in time range)  albuterol (PROVENTIL) (2.5 MG/3ML) 0.083% nebulizer solution 2.5 mg (has no administration in time range)  sodium chloride flush (NS) 0.9 % injection 3 mL (has no administration in time range)  acetaminophen (TYLENOL) tablet 650  mg (has no administration in time range)    Or  acetaminophen (TYLENOL) suppository 650 mg (has no administration in time range)  polyethylene glycol (MIRALAX / GLYCOLAX) packet 17 g (has no administration in time range)  sodium zirconium cyclosilicate (LOKELMA) packet 10 g (has no administration in time range)  0.9 %  sodium chloride infusion (has no administration in time range)  insulin glargine-yfgn (SEMGLEE) injection 20  Units (has no administration in time range)  insulin aspart (novoLOG) injection 0-9 Units (has no administration in time range)  insulin aspart (novoLOG) injection 0-5 Units (has no administration in time range)  lactated ringers bolus 1,000 mL (0 mLs Intravenous Stopped 04/30/22 1255)  calcium gluconate 1 g/ 50 mL sodium chloride IVPB (0 mg Intravenous Stopped 04/30/22 1533)  albuterol (PROVENTIL) (2.5 MG/3ML) 0.083% nebulizer solution 10 mg (10 mg Nebulization Given 04/30/22 1338)  insulin aspart (novoLOG) injection 5 Units (5 Units Intravenous Given 04/30/22 1539)  sodium zirconium cyclosilicate (LOKELMA) packet 10 g (10 g Oral Given 04/30/22 1536)  lactated ringers bolus 1,000 mL (0 mLs Intravenous Stopped 04/30/22 1646)                                                                                                                                     Procedures .Critical Care  Performed by: Teressa Lower, MD Authorized by: Teressa Lower, MD   Critical care provider statement:    Critical care time (minutes):  30   Critical care was necessary to treat or prevent imminent or life-threatening deterioration of the following conditions:  Metabolic crisis and respiratory failure   Critical care was time spent personally by me on the following activities:  Development of treatment plan with patient or surrogate, discussions with consultants, evaluation of patient's response to treatment, examination of patient, ordering and review of laboratory studies, ordering and review  of radiographic studies, ordering and performing treatments and interventions, pulse oximetry, re-evaluation of patient's condition and review of old charts   (including critical care time)  Medical Decision Making / ED Course   This patient presents to the ED for concern of fatigue, lethargy, this involves an extensive number of treatment options, and is a complaint that carries with it a high risk of complications and morbidity.  The differential diagnosis includes polypharmacy, sepsis, UTI, electrolyte abnormality, pneumonia, renal failure with uremia  MDM: Patient seen emergency room for evaluation of lethargy and fatigue.  Physical exam with baseline neurologic deficits from previous strokes including facial droop and extremity weakness but is otherwise unremarkable.  Laboratory evaluation is highly concerning with a potassium of 6.9, CO2 19, BUN 66, creatinine 2.71.  COVID, flu, RSV negative and obtained in the setting of unexplained lethargy.  Chest x-ray with left lower lobe atelectasis.  When patient falls asleep she does drop her oxygen saturations into the 80s and patient was placed on 2 L nasal cannula.  CT head with no acute findings.  Renal ultrasound obtained to rule out obstructive uropathy which was reassuringly normal.  Fluid resuscitation begun and hyperkalemia treatment begun high-dose albuterol, calcium gluconate.  As this would be a significant deviation of the patient's baseline electrolyte function, repeat labs were sent to ensure that this lab is accurate and at time of signout, these repeat labs are pending.  Insulin has been ordered and I did request that  nursing staff hold off on giving this until repeat labs are confirmed as the patient has no ECG findings including no hyperacute T waves or QRS elongation.  Please see provider signout for continuation of workup.  Anticipate admission.  Suspect oversedation from high-dose baclofen regimen likely leading to prerenal AKI and  renal failure.   Additional history obtained: -Additional history obtained from husband -External records from outside source obtained and reviewed including: Chart review including previous notes, labs, imaging, consultation notes   Lab Tests: -I ordered, reviewed, and interpreted labs.   The pertinent results include:   Labs Reviewed  COMPREHENSIVE METABOLIC PANEL - Abnormal; Notable for the following components:      Result Value   Sodium 128 (*)    Potassium 6.9 (*)    Chloride 95 (*)    CO2 19 (*)    Glucose, Bld 313 (*)    BUN 66 (*)    Creatinine, Ser 2.71 (*)    AST 13 (*)    GFR, Estimated 21 (*)    All other components within normal limits  CBC WITH DIFFERENTIAL/PLATELET - Abnormal; Notable for the following components:   RBC 5.25 (*)    MCV 77.5 (*)    MCH 25.9 (*)    Platelets 92 (*)    All other components within normal limits  POTASSIUM - Abnormal; Notable for the following components:   Potassium 7.3 (*)    All other components within normal limits  I-STAT CHEM 8, ED - Abnormal; Notable for the following components:   Sodium 130 (*)    Potassium 7.0 (*)    BUN 64 (*)    Creatinine, Ser 2.80 (*)    Glucose, Bld 288 (*)    Calcium, Ion 1.13 (*)    All other components within normal limits  CBG MONITORING, ED - Abnormal; Notable for the following components:   Glucose-Capillary 274 (*)    All other components within normal limits  I-STAT VENOUS BLOOD GAS, ED - Abnormal; Notable for the following components:   pO2, Ven 56 (*)    Acid-base deficit 3.0 (*)    Sodium 129 (*)    Potassium 7.0 (*)    All other components within normal limits  CBG MONITORING, ED - Abnormal; Notable for the following components:   Glucose-Capillary 224 (*)    All other components within normal limits  CBG MONITORING, ED - Abnormal; Notable for the following components:   Glucose-Capillary 200 (*)    All other components within normal limits  RESP PANEL BY RT-PCR (RSV, FLU  A&B, COVID)  RVPGX2  TSH  BRAIN NATRIURETIC PEPTIDE  CBC WITH DIFFERENTIAL/PLATELET  URINALYSIS, ROUTINE W REFLEX MICROSCOPIC  BLOOD GAS, VENOUS  GLUCOSE, RANDOM  HIV ANTIBODY (ROUTINE TESTING W REFLEX)  BASIC METABOLIC PANEL  BASIC METABOLIC PANEL  COMPREHENSIVE METABOLIC PANEL  CBC  TROPONIN I (HIGH SENSITIVITY)  TROPONIN I (HIGH SENSITIVITY)      EKG   EKG Interpretation  Date/Time:  Tuesday April 30 2022 11:03:37 EDT Ventricular Rate:  76 PR Interval:  208 QRS Duration: 86 QT Interval:  397 QTC Calculation: 447 R Axis:   63 Text Interpretation: Sinus rhythm Borderline prolonged PR interval Confirmed by [place] ([id number]) on 04/30/2022 1:30:27 PM         Imaging Studies ordered: I ordered imaging studies including chest x-ray, CT head, renal ultrasound I independently visualized and interpreted imaging. I agree with the radiologist interpretation   Medicines ordered and prescription drug management:  Meds ordered this encounter  Medications   lactated ringers bolus 1,000 mL   DISCONTD: insulin aspart (novoLOG) injection 10 Units   calcium gluconate 1 g/ 50 mL sodium chloride IVPB   albuterol (PROVENTIL) (2.5 MG/3ML) 0.083% nebulizer solution 10 mg   insulin aspart (novoLOG) injection 5 Units   sodium zirconium cyclosilicate (LOKELMA) packet 10 g   lactated ringers bolus 1,000 mL   pantoprazole (PROTONIX) EC tablet 40 mg   albuterol (PROVENTIL) (2.5 MG/3ML) 0.083% nebulizer solution 2.5 mg   DISCONTD: enoxaparin (LOVENOX) injection 30 mg   sodium chloride flush (NS) 0.9 % injection 3 mL   OR Linked Order Group    acetaminophen (TYLENOL) tablet 650 mg    acetaminophen (TYLENOL) suppository 650 mg   polyethylene glycol (MIRALAX / GLYCOLAX) packet 17 g   sodium zirconium cyclosilicate (LOKELMA) packet 10 g   0.9 %  sodium chloride infusion   insulin glargine-yfgn (SEMGLEE) injection 20 Units   insulin aspart (novoLOG) injection 0-9 Units    Order Specific  Question:   Correction coverage:    Answer:   Sensitive (thin, NPO, renal)    Order Specific Question:   CBG < 70:    Answer:   Implement Hypoglycemia Standing Orders and refer to Hypoglycemia Standing Orders sidebar report    Order Specific Question:   CBG 70 - 120:    Answer:   0 units    Order Specific Question:   CBG 121 - 150:    Answer:   1 unit    Order Specific Question:   CBG 151 - 200:    Answer:   2 units    Order Specific Question:   CBG 201 - 250:    Answer:   3 units    Order Specific Question:   CBG 251 - 300:    Answer:   5 units    Order Specific Question:   CBG 301 - 350:    Answer:   7 units    Order Specific Question:   CBG 351 - 400    Answer:   9 units    Order Specific Question:   CBG > 400    Answer:   call MD and obtain STAT lab verification   insulin aspart (novoLOG) injection 0-5 Units    Order Specific Question:   Correction coverage:    Answer:   HS scale    Order Specific Question:   CBG < 70:    Answer:   Implement Hypoglycemia Standing Orders and refer to Hypoglycemia Standing Orders sidebar report    Order Specific Question:   CBG 70 - 120:    Answer:   0 units    Order Specific Question:   CBG 121 - 150:    Answer:   0 units    Order Specific Question:   CBG 151 - 200:    Answer:   0 units    Order Specific Question:   CBG 201 - 250:    Answer:   2 units    Order Specific Question:   CBG 251 - 300:    Answer:   3 units    Order Specific Question:   CBG 301 - 350:    Answer:   4 units    Order Specific Question:   CBG 351 - 400:    Answer:   5 units    Order Specific Question:   CBG > 400    Answer:   call MD and  obtain STAT lab verification    -I have reviewed the patients home medicines and have made adjustments as needed  Critical interventions Hyperkalemia treatment, fluid resuscitation    Cardiac Monitoring: The patient was maintained on a cardiac monitor.  I personally viewed and interpreted the cardiac monitored which showed an  underlying rhythm of: NSR  Social Determinants of Health:  Factors impacting patients care include: none   Reevaluation: After the interventions noted above, I reevaluated the patient and found that they have :improved  Co morbidities that complicate the patient evaluation  Past Medical History:  Diagnosis Date   CAD (coronary artery disease)    Depression    Diabetes mellitus without complication ([place])    History of CT scan    History of mammogram    History of MRI    Hypertension    Pheochromocytoma    Pheochromocytoma    Stroke [facility])    Thyroid disease       Dispostion: I considered admission for this patient, and at time of signout, patient pending follow-up laboratory evaluation.  Anticipate admission for hyperkalemia and new AKI     Final Clinical Impression(s) / ED Diagnoses Final diagnoses:  None     @PCDICTATION @    Teressa Lower, MD 04/30/22 1825

## 2022-04-30 NOTE — ED Notes (Signed)
IV team consulted for pt [place] IV. [place] IV attempted by MD, and secondary RN. Not able to obtain IV access previous IV access infiltrated. Pt hard stick. Secretary paged IV team to come down.

## 2022-04-30 NOTE — ED Notes (Signed)
..ED TO INPATIENT HANDOFF REPORT  ED Nurse Name and Phone #: [phone]  S Name/Age/Gender [place] 52 y.o. female Room/Bed: 028C/028C  Code Status   Code Status: Full Code  Home/SNF/Other Home Patient oriented to: self, place, time, and situation Is this baseline? Yes   Triage Complete: Triage complete  Chief Complaint AKI (acute kidney injury) ([place]) [N17.9]  Triage Note Pt to ED via EMS with c/o generalized weakness. Per EMS pt husband stated pt has been having increased weakness to her left side. Pt has previous CVA with left side deficits. Pt husband told EMS she was last seen normal around 12p yesterday. Pt arrives Aox4 and reports feeling very weak. Denies any N/V/D, or any sick contact. Pt states she slept all day yesterday, pt alert but lethargic in triage.   Allergies Allergies  Allergen Reactions   Contrast Media [Iodinated Contrast Media] Anaphylaxis and Swelling   Sumatriptan Anaphylaxis   Aspirin Swelling   Ciprofloxacin    Morphine Swelling   Penicillins Swelling    Mouth swells up and eyes swollen shut    Level of Care/Admitting Diagnosis ED Disposition     ED Disposition  Admit   Condition  --   Comment  Hospital Area: [place] [[id number]]  Level of Care: Progressive [102]  Admit to Progressive based on following criteria: NEPHROLOGY stable condition requiring close monitoring for AKI, requiring Hemodialysis or Peritoneal Dialysis either from expected electrolyte imbalance, acidosis, or fluid overload that can be managed by NIPPV or high flow oxygen.  May place patient in observation at [facility] or [place] if equivalent level of care is available:: No  Covid Evaluation: Asymptomatic - no recent exposure (last 10 days) testing not required  Diagnosis: AKI (acute kidney injury) [facility]) [id number]  Admitting Physician: Marcelyn Bruins [id number]  Attending Physician: Marcelyn Bruins [id number]           B Medical/Surgery History Past Medical History:  Diagnosis Date   CAD (coronary artery disease)    Depression    Diabetes mellitus without complication ([place])    History of CT scan    History of mammogram    History of MRI    Hypertension    Pheochromocytoma    Pheochromocytoma    Stroke ([place])    Thyroid disease    Past Surgical History:  Procedure Laterality Date   ABDOMINAL HYSTERECTOMY     CESAREAN SECTION     3   CORONARY ARTERY BYPASS GRAFT     Right adrenal gland removal for pheochromocytoma Right      A IV Location/Drains/Wounds Patient Lines/Drains/Airways Status     Active Line/Drains/Airways     Name Placement date Placement time Site Days   Peripheral IV 04/30/22 22 G Anterior;Proximal;Right Forearm 04/30/22  1124  Forearm  less than 1   Peripheral IV 04/30/22 20 G 2.5" Anterior;Distal;Right;Upper Arm 04/30/22  1458  Arm  less than 1   External Urinary Catheter 11/02/21  0119  --  179   Wound / Incision (Open or Dehisced) 11/02/21 Irritant Dermatitis (Moisture Associated Skin Damage) Groin Right;Left;Mid Redness, skin irritation, sloughing 11/02/21  1545  Groin  179            Intake/Output Last 24 hours No intake or output data in the 24 hours ending 04/30/22 1644  Labs/Imaging Results for orders placed or performed during the hospital encounter of 04/30/22 (from the past 48 hour(s))  Resp panel by RT-PCR (RSV, Flu A&B, Covid)  Anterior Nasal Swab     Status: None   Collection Time: 04/30/22 11:27 AM   Specimen: Anterior Nasal Swab  Result Value Ref Range   SARS Coronavirus 2 by RT PCR NEGATIVE NEGATIVE   Influenza A by PCR NEGATIVE NEGATIVE   Influenza B by PCR NEGATIVE NEGATIVE    Comment: (NOTE) The Xpert Xpress SARS-CoV-2/FLU/RSV plus assay is intended as an aid in the diagnosis of influenza from Nasopharyngeal swab specimens and should not be used as a sole basis for treatment. Nasal washings and aspirates are unacceptable for Xpert  Xpress SARS-CoV-2/FLU/RSV testing.  Fact Sheet for Patients: EntrepreneurPulse.com.au  Fact Sheet for Healthcare Providers: IncredibleEmployment.be  This test is not yet approved or cleared by the [place] FDA and has been authorized for detection and/or diagnosis of SARS-CoV-2 by FDA under an Emergency Use Authorization (EUA). This EUA will remain in effect (meaning this test can be used) for the duration of the COVID-19 declaration under Section 564(b)(1) of the Act, 21 U.S.C. section 360bbb-3(b)(1), unless the authorization is terminated or revoked.     Resp Syncytial Virus by PCR NEGATIVE NEGATIVE    Comment: (NOTE) Fact Sheet for Patients: EntrepreneurPulse.com.au  Fact Sheet for Healthcare Providers: IncredibleEmployment.be  This test is not yet approved or cleared by the [place] FDA and has been authorized for detection and/or diagnosis of SARS-CoV-2 by FDA under an Emergency Use Authorization (EUA). This EUA will remain in effect (meaning this test can be used) for the duration of the COVID-19 declaration under Section 564(b)(1) of the Act, 21 U.S.C. section 360bbb-3(b)(1), unless the authorization is terminated or revoked.  Performed at [facility], [place] [address] [place], [place] [address]   Comprehensive metabolic panel     Status: Abnormal   Collection Time: 04/30/22 11:28 AM  Result Value Ref Range   Sodium 128 (L) 135 - 145 mmol/L   Potassium 6.9 (HH) 3.5 - 5.1 mmol/L    Comment: CRITICAL RESULT CALLED TO, READ BACK BY AND VERIFIED WITH Chucky May, RN 1320 04/30/22 L. KLAR   Chloride 95 (L) 98 - 111 mmol/L   CO2 19 (L) 22 - 32 mmol/L   Glucose, Bld 313 (H) 70 - 99 mg/dL    Comment: Glucose reference range applies only to samples taken after fasting for at least 8 hours.   BUN 66 (H) 6 - 20 mg/dL   Creatinine, Ser 2.71 (H) 0.44 - 1.00 mg/dL   Calcium 9.3 8.9 - 10.3  mg/dL   Total Protein 7.5 6.5 - 8.1 g/dL   Albumin 3.8 3.5 - 5.0 g/dL   AST 13 (L) 15 - 41 U/L   ALT 17 0 - 44 U/L   Alkaline Phosphatase 110 38 - 126 U/L   Total Bilirubin 0.7 0.3 - 1.2 mg/dL   GFR, Estimated 21 (L) >60 mL/min    Comment: (NOTE) Calculated using the CKD-EPI Creatinine Equation (2021)    Anion gap 14 5 - 15    Comment: Performed at [facility], [place] [address] [place], [place] [address]  Troponin I (High Sensitivity)     Status: None   Collection Time: 04/30/22 11:28 AM  Result Value Ref Range   Troponin I (High Sensitivity) 4 <18 ng/L    Comment: (NOTE) Elevated high sensitivity troponin I (hsTnI) values and significant  changes across serial measurements may suggest ACS but many other  chronic and acute conditions are known to elevate hsTnI results.  Refer to the "Links"  section for chest pain algorithms and additional  guidance. Performed at [facility], [place] [address] [place], [place] [address]   TSH     Status: None   Collection Time: 04/30/22 11:28 AM  Result Value Ref Range   TSH 1.865 0.350 - 4.500 uIU/mL    Comment: Performed by a 3rd Generation assay with a functional sensitivity of <=0.01 uIU/mL. Performed at [facility], [place] [address], [place], [place] [address]   CBC with Differential/Platelet     Status: Abnormal   Collection Time: 04/30/22 12:45 PM  Result Value Ref Range   WBC 6.7 4.0 - 10.5 K/uL   RBC 5.25 (H) 3.87 - 5.11 MIL/uL   Hemoglobin 13.6 12.0 - 15.0 g/dL   HCT 40.7 36.0 - 46.0 %   MCV 77.5 (L) 80.0 - 100.0 fL   MCH 25.9 (L) 26.0 - 34.0 pg   MCHC 33.4 30.0 - 36.0 g/dL   RDW 15.1 11.5 - 15.5 %   Platelets 92 (L) 150 - 400 K/uL    Comment: Immature Platelet Fraction may be clinically indicated, consider ordering this additional test [id number] REPEATED TO VERIFY PLATELET COUNT CONFIRMED BY SMEAR    nRBC 0.0 0.0 - 0.2 %   Neutrophils Relative % 65 %   Neutro Abs 4.4 1.7 - 7.7 K/uL   Lymphocytes Relative  28 %   Lymphs Abs 1.9 0.7 - 4.0 K/uL   Monocytes Relative 4 %   Monocytes Absolute 0.3 0.1 - 1.0 K/uL   Eosinophils Relative 1 %   Eosinophils Absolute 0.1 0.0 - 0.5 K/uL   Basophils Relative 1 %   Basophils Absolute 0.0 0.0 - 0.1 K/uL   Immature Granulocytes 1 %   Abs Immature Granulocytes 0.03 0.00 - 0.07 K/uL    Comment: Performed at [facility], [place] [address], [place], [place] [address]  Brain natriuretic peptide     Status: None   Collection Time: 04/30/22 12:45 PM  Result Value Ref Range   B Natriuretic Peptide 26.3 0.0 - 100.0 pg/mL    Comment: Performed at [place] [address], [place], [place] [address]  Troponin I (High Sensitivity)     Status: None   Collection Time: 04/30/22  2:50 PM  Result Value Ref Range   Troponin I (High Sensitivity) 4 <18 ng/L    Comment: (NOTE) Elevated high sensitivity troponin I (hsTnI) values and significant  changes across serial measurements may suggest ACS but many other  chronic and acute conditions are known to elevate hsTnI results.  Refer to the "Links" section for chest pain algorithms and additional  guidance. Performed at [facility], [place] [address] [place], [place] [address]   Potassium     Status: Abnormal   Collection Time: 04/30/22  2:50 PM  Result Value Ref Range   Potassium 7.3 (HH) 3.5 - 5.1 mmol/L    Comment: HEMOLYSIS AT THIS LEVEL MAY AFFECT RESULT CRITICAL RESULT CALLED TO, READ BACK BY AND VERIFIED WITH C BERRIER RN 04/30/2022 1605 BNUNNERY Performed at [facility], [place] [address], [place], [place] [address]   I-stat chem 8, ED (not at [facility], [facility] or [facility])     Status: Abnormal   Collection Time: 04/30/22  3:08 PM  Result Value Ref Range   Sodium 130 (L) 135 - 145 mmol/L   Potassium 7.0 (HH) 3.5 - 5.1 mmol/L   Chloride 99 98 - 111 mmol/L   BUN  64 (H) 6 - 20 mg/dL   Creatinine, Ser 2.80 (H) 0.44 - 1.00 mg/dL   Glucose, Bld 288 (H) 70 - 99 mg/dL    Comment: Glucose reference range  applies only to samples taken after fasting for at least 8 hours.   Calcium, Ion 1.13 (L) 1.15 - 1.40 mmol/L   TCO2 25 22 - 32 mmol/L   Hemoglobin 14.3 12.0 - 15.0 g/dL   HCT 42.0 36.0 - 46.0 %   Comment NOTIFIED PHYSICIAN   I-Stat venous blood gas, ED     Status: Abnormal   Collection Time: 04/30/22  3:25 PM  Result Value Ref Range   pH, Ven 7.272 7.25 - 7.43   pCO2, Ven 53.4 44 - 60 mmHg   pO2, Ven 56 (H) 32 - 45 mmHg   Bicarbonate 24.6 20.0 - 28.0 mmol/L   TCO2 26 22 - 32 mmol/L   O2 Saturation 84 %   Acid-base deficit 3.0 (H) 0.0 - 2.0 mmol/L   Sodium 129 (L) 135 - 145 mmol/L   Potassium 7.0 (HH) 3.5 - 5.1 mmol/L   Calcium, Ion 1.17 1.15 - 1.40 mmol/L   HCT 42.0 36.0 - 46.0 %   Hemoglobin 14.3 12.0 - 15.0 g/dL   Sample type VENOUS    Comment NOTIFIED PHYSICIAN   CBG monitoring, ED     Status: Abnormal   Collection Time: 04/30/22  3:35 PM  Result Value Ref Range   Glucose-Capillary 274 (H) 70 - 99 mg/dL    Comment: Glucose reference range applies only to samples taken after fasting for at least 8 hours.  CBG monitoring, ED     Status: Abnormal   Collection Time: 04/30/22  4:16 PM  Result Value Ref Range   Glucose-Capillary 224 (H) 70 - 99 mg/dL    Comment: Glucose reference range applies only to samples taken after fasting for at least 8 hours.   US RENAL  Result Date: 04/30/2022 CLINICAL DATA:  Acute kidney injury. EXAM: RENAL / URINARY TRACT ULTRASOUND COMPLETE COMPARISON:  CT [facility] 07/10/2021 FINDINGS: Right Kidney: Renal measurements: 10.0 x 4.4 x 3.7 cm = volume: 85.5 mL. Echogenicity within normal limits. No mass or hydronephrosis visualized. Left Kidney: Renal measurements: 9.8 x 5.3 x 5.4 cm = volume: 145.8 mL. Echogenicity within normal limits. No mass or hydronephrosis visualized. Bladder: Appears normal for degree of bladder distention. Other: Exam detail is diminished secondary to patient body habitus. IMPRESSION: No acute abnormality.  No signs of obstructive  uropathy. Electronically Signed   By: Kerby Moors M.D.   On: 04/30/2022 15:43   CT Head Wo Contrast  Result Date: 04/30/2022 CLINICAL DATA:  Left-sided weakness, acute stroke suspected, previous history of stroke with left-sided weakness EXAM: CT HEAD WITHOUT CONTRAST TECHNIQUE: Contiguous axial images were obtained from the base of the skull through the vertex without intravenous contrast. RADIATION DOSE REDUCTION: This exam was performed according to the departmental dose-optimization program which includes automated exposure control, adjustment of the mA and/or kV according to patient size and/or use of iterative reconstruction technique. COMPARISON:  11/03/2021 FINDINGS: Brain: No evidence of acute infarction, hemorrhage, hydrocephalus, extra-axial collection or mass lesion/mass effect. Periventricular and deep white matter hypodensity. Unchanged small bilateral lacunar infarctions of the basal ganglia (series 3, image 20). Unchanged encephalomalacia of the left frontal lobe (series 3, image 16) and left cerebellar hemisphere (series 3, image 26). Vascular: No hyperdense vessel or unexpected calcification. Skull: Normal. Negative for fracture or focal lesion. Sinuses/Orbits: No acute finding.  Other: None. IMPRESSION: 1. No CT evidence of acute intracranial pathology. 2. Small-vessel white matter disease significantly advanced for patient age. 3. Unchanged small bilateral lacunar infarctions of the basal ganglia. Unchanged encephalomalacia of the left frontal lobe and left cerebellar hemisphere. 4. Consider MRI to more sensitively evaluate for acute diffusion restricting infarction if clinically suspected. Electronically Signed   By: Delanna Ahmadi M.D.   On: 04/30/2022 13:00   DG Chest Portable 1 View  Result Date: 04/30/2022 CLINICAL DATA:  Hypoxia EXAM: PORTABLE CHEST 1 VIEW COMPARISON:  CXR 11/03/21 FINDINGS: No pleural effusion. No pneumothorax. Low lung volumes. Left basilar atelectasis. Likely  unchanged cardiac and mediastinal contours when accounting for differences in lung volumes. Status post median sternotomy. No focal airspace opacity. No radiographically apparent displaced rib fractures. Visualized upper abdomen is unremarkable. Surgical clips in the right upper quadrant IMPRESSION: Low lung volumes likely left basilar atelectasis. Electronically Signed   By: Marin Roberts M.D.   On: 04/30/2022 11:58    Pending Labs Unresulted Labs (From admission, onward)     Start     Ordered   05/01/22 0500  Comprehensive metabolic panel  Tomorrow morning,   R       Question:  Specimen collection method  Answer:  IV Team=IV Team collect   04/30/22 1614   05/01/22 0500  CBC  Tomorrow morning,   R       Question:  Specimen collection method  Answer:  IV Team=IV Team collect   04/30/22 1614   04/30/22 [id number]  Basic metabolic panel  Now then every 4 hours,   R (with TIMED occurrences)     Question:  Specimen collection method  Answer:  IV Team=IV Team collect   04/30/22 1614   04/30/22 1627  Glucose, random  ((URGENT) No ECG changes & Potassium > 6.5)  Once,   STAT       Comments: 1 hour after insulin administered   Question:  Specimen collection method  Answer:  IV Team=IV Team collect   04/30/22 1512   04/30/22 1606  HIV Antibody (routine testing w rflx)  (HIV Antibody (Routine testing w reflex) panel)  Once,   R       Question:  Specimen collection method  Answer:  IV Team=IV Team collect   04/30/22 1614   04/30/22 1329  Blood gas, venous (at [facility] and [facility])  Once,   R        04/30/22 1328   04/30/22 1127  Urinalysis, Routine w reflex microscopic -Urine, Clean Catch  Once,   URGENT       Question:  Specimen Source  Answer:  Urine, Clean Catch   04/30/22 1126   04/30/22 1124  CBC with Differential  Once,   STAT        04/30/22 1126            Vitals/Pain Today's Vitals   04/30/22 1517 04/30/22 1518 04/30/22 1521 04/30/22 1545  BP:    120/80  Pulse: 72 70  88  Resp: 11 14  (!) 23   Temp:   98 F (36.7 C)   TempSrc:   Oral   SpO2: 100% 96%  100%  PainSc:        Isolation Precautions No active isolations  Medications Medications  pantoprazole (PROTONIX) EC tablet 40 mg (has no administration in time range)  albuterol (PROVENTIL) (2.5 MG/3ML) 0.083% nebulizer solution 2.5 mg (has no administration in time range)  sodium chloride flush (NS) 0.9 %  injection 3 mL (has no administration in time range)  acetaminophen (TYLENOL) tablet 650 mg (has no administration in time range)    Or  acetaminophen (TYLENOL) suppository 650 mg (has no administration in time range)  polyethylene glycol (MIRALAX / GLYCOLAX) packet 17 g (has no administration in time range)  lactated ringers bolus 1,000 mL (0 mLs Intravenous Stopped 04/30/22 1255)  calcium gluconate 1 g/ 50 mL sodium chloride IVPB (0 mg Intravenous Stopped 04/30/22 1533)  albuterol (PROVENTIL) (2.5 MG/3ML) 0.083% nebulizer solution 10 mg (10 mg Nebulization Given 04/30/22 1338)  insulin aspart (novoLOG) injection 5 Units (5 Units Intravenous Given 04/30/22 1539)  sodium zirconium cyclosilicate (LOKELMA) packet 10 g (10 g Oral Given 04/30/22 1536)  lactated ringers bolus 1,000 mL (1,000 mLs Intravenous New Bag/Given 04/30/22 1535)    Mobility walks with person assist     Focused Assessments Cardiac Assessment Handoff:    No results found for: "CKTOTAL", "CKMB", "CKMBINDEX", "TROPONINI" Lab Results  Component Value Date   DDIMER 0.41 11/02/2021   Does the Patient currently have chest pain? No   , Neuro Assessment Handoff:  Swallow screen pass? Yes      Last date known well: 04/29/22 Last time known well: 1200 Neuro Assessment: Within Defined Limits Neuro Checks:      Has TPA been given? No If patient is a Neuro Trauma and patient is going to OR before floor call report to [place] nurse: [phone] or [phone]   R Recommendations: See Admitting Provider Note  Report given to:   Additional  Notes: HyperK protocol complete, no chest pain, NSR. Aox4. Left side deficits prev CVA

## 2022-04-30 NOTE — ED Provider Notes (Signed)
Received patient in turnover from Dr. Matilde Sprang.  Please see their note for further details of Hx, PE.  Briefly patient is a 52 y.o. female with a Weakness .  Patient was found to have an AKI with hyperkalemia.  I-STAT sent to recheck which is consistent with initial lab work.  Will temporize.  Patient has been a bit more sleepy than normal.  Requires oxygen when she is asleep which is not known to be normal for her.  Plan to admit post blood work.  EKG without obvious concerning findings consistent with hyperkalemia.  CRITICAL CARE Performed by: Cecilio Asper   Total critical care time: 35 minutes  Critical care time was exclusive of separately billable procedures and treating other patients.  Critical care was necessary to treat or prevent imminent or life-threatening deterioration.  Critical care was time spent personally by me on the following activities: development of treatment plan with patient and/or surrogate as well as nursing, discussions with consultants, evaluation of patient's response to treatment, examination of patient, obtaining history from patient or surrogate, ordering and performing treatments and interventions, ordering and review of laboratory studies, ordering and review of radiographic studies, pulse oximetry and re-evaluation of patient's condition.     Deno Etienne, DO 04/30/22 1609

## 2022-04-30 NOTE — ED Triage Notes (Addendum)
Pt to ED via EMS with c/o generalized weakness. Per EMS pt husband stated pt has been having increased weakness to her left side. Pt has previous CVA with left side deficits. Pt husband told EMS she was last seen normal around 12p yesterday. Pt arrives Aox4 and reports feeling very weak. Denies any N/V/D, or any sick contact. Pt states she slept all day yesterday, pt alert but lethargic in triage.

## 2022-04-30 NOTE — H&P (Signed)
History and Physical   Lori [name] [id number] DOB: 1970-06-10 DOA: 04/30/2022  PCP: [name], NP   Patient coming from: Home  Chief Complaint: Weakness, lethargy  HPI: Lori [name] is a 52 y.o. female with medical history significant of stroke with spastic hemiplegia and aphasia, wheelchair-bound, diabetes, gastroparesis, hyperlipidemia, depression, anxiety, hypothyroidism, GERD, hypertension, CAD, pheochromocytoma presenting with weakness and lethargy.  Is obtained with assistance of husband and chart review.  Patient has history of prior strokes and spastic hemiplegia on the left as above.  Has been stated she can walk at times but is largely bed and wheelchair-bound.  Usually awake and conversant.  Has had increased fatigue for the last day or so with significant decline in her function and ability to be conversant.  She has been noted to be falling asleep midsentence at times which is very unusual for her.  Reports feeling hungry and that she did not sleep too much at night before.  Denies fevers, chills, chest pain, shortness of breath, Donnell pain, constipation, diarrhea, nausea, vomiting.  ED Course: Vital signs in the ED stable.  Lab workup included CMP with sodium 128, potassium 6.9, chloride 95, bicarb 19, BUN 66, creatinine elevated 2.79 from baseline of 0.7, glucose 313, calcium 9.3, protein 7.5, albumin 3.8.  CBC notable for platelets of 92.  Troponin normal with repeat pending.  BNP pending.  Respiratory panel for flu COVID and RSV pending.  Urinalysis pending.  TSH normal.  VBG with normal pH and normal pCO2.  EKG noted to have sinus rhythm at 76 beats per minutes without any peaked T waves and QRS of 86.  Patient received Lokelma, insulin, 2 L of IV fluids, calcium gluconate, albuterol in the ED.  Review of Systems: As per HPI otherwise all other systems reviewed and are negative.  Past Medical History:  Diagnosis Date   CAD (coronary artery disease)     Depression    Diabetes mellitus without complication ([place])    History of CT scan    History of mammogram    History of MRI    Hypertension    Pheochromocytoma    Pheochromocytoma    Stroke [facility])    Thyroid disease     Past Surgical History:  Procedure Laterality Date   ABDOMINAL HYSTERECTOMY     CESAREAN SECTION     3   CORONARY ARTERY BYPASS GRAFT     Right adrenal gland removal for pheochromocytoma Right     Social History  reports that she has never smoked. She has never used smokeless tobacco. She reports that she does not currently use alcohol. She reports that she does not use drugs.  Allergies  Allergen Reactions   Contrast Media [Iodinated Contrast Media] Anaphylaxis and Swelling   Sumatriptan Anaphylaxis   Aspirin Swelling   Ciprofloxacin    Morphine Swelling   Penicillins Swelling    Mouth swells up and eyes swollen shut    Family History  Problem Relation Age of Onset   Hypertension Mother    Diabetes Mother    Atrial fibrillation Mother    Hypertension Father    Heart attack Father    Dementia Father    Diabetes Brother    Brain cancer Brother    Asthma Daughter    Sickle cell anemia Son    Atrial fibrillation Maternal Uncle   Reviewed on admission  Prior to Admission medications   Medication Sig Start Date End Date Taking? Authorizing Provider  albuterol (PROVENTIL) (  2.5 MG/3ML) 0.083% nebulizer solution Take 3 mLs (2.5 mg total) by nebulization every 6 (six) hours as needed for wheezing or shortness of breath. 10/31/21   Ngetich, Dinah C, NP  amLODipine (NORVASC) 10 MG tablet Take 1 tablet (10 mg total) by mouth daily. 11/08/21 03/05/22  Donne Hazel, MD  atorvastatin (LIPITOR) 80 MG tablet Take 80 mg by mouth daily. 06/25/21   [provider]  baclofen (LIORESAL) 10 MG tablet Take 0.5 tablets (5 mg total) by mouth 3 (three) times daily. X 1 week, then 10 mg TID-for spasticity- can increase  the dose if you call me to discuss 04/08/22    Courtney Heys, MD  blood glucose meter kit and supplies KIT Dispense based on patient and insurance preference. Use up to four times daily as directed. 03/16/21   Love, Ivan Anchors, PA-C  budesonide (PULMICORT) 0.25 MG/2ML nebulizer solution Take 2 mLs (0.25 mg total) by nebulization 2 (two) times daily. 10/31/21   Ngetich, Dinah C, NP  clopidogrel (PLAVIX) 75 MG tablet Take 75 mg by mouth daily.    [provider]  Continuous Blood Gluc Receiver (DEXCOM G5 RECEIVER KIT) DEVI 1 Device by Does not apply route 4 (four) times daily. 10/22/21   Ngetich, Dinah C, NP  cyclobenzaprine (FLEXERIL) 5 MG tablet TAKE 1 TABLET(5 MG) BY MOUTH TWICE DAILY FOR UP TO 7 DAYS AS NEEDED FOR MUSCLE SPASMS 02/21/22   Lovorn, Jinny Blossom, MD  diclofenac Sodium (VOLTAREN) 1 % GEL Apply 4 g topically 4 (four) times daily. 03/15/21   Love, Ivan Anchors, PA-C  diphenhydrAMINE (BENADRYL) 25 MG tablet Take 25 mg by mouth at bedtime.    [provider]  escitalopram (LEXAPRO) 20 MG tablet Take 1 tablet (20 mg total) by mouth every morning. 01/30/22   Ngetich, Dinah C, NP  ezetimibe (ZETIA) 10 MG tablet Take 1 tablet (10 mg total) by mouth daily. 05/23/21   Garvin Fila, MD  gabapentin (NEURONTIN) 100 MG capsule TAKE ONE CAPSULE BY MOUTH THREE TIMES DAILY FOR NEUROPATHY 12/24/21   Ngetich, Dinah C, NP  guaiFENesin (MUCINEX) 600 MG 12 hr tablet Take 1 tablet (600 mg total) by mouth 2 (two) times daily. 01/23/22   Ngetich, Dinah C, NP  hydrOXYzine (ATARAX) 25 MG tablet Take 1 tablet (25 mg total) by mouth at bedtime as needed for anxiety. 08/30/21   Regalado, Belkys A, MD  insulin aspart (NOVOLOG) 100 UNIT/ML FlexPen Inject 7 Units into the skin 3 (three) times daily with meals. 02/11/22   Ngetich, Dinah C, NP  insulin glargine (LANTUS) 100 UNIT/ML Solostar Pen Inject 38 Units into the skin at bedtime.    [provider]  Insulin Pen Needle 32G X 4 MM MISC Use to inject insulin 4 times daily as directed. 03/15/21   Love, Ivan Anchors, PA-C  ipratropium-albuterol (DUONEB) 0.5-2.5 (3) MG/3ML SOLN Take 3 mLs by nebulization 2 (two) times daily. 10/22/21   Ngetich, Dinah C, NP  LORazepam (ATIVAN) 0.5 MG tablet Take 1 tablet (0.5 mg total) by mouth at bedtime. 01/08/22   Ngetich, Dinah C, NP  MELATONIN GUMMIES PO Take 9 mg by mouth at bedtime as needed.    [provider]  methimazole (TAPAZOLE) 5 MG tablet TAKE 1 TABLET BY MOUTH EVERY MORNING 05/23/21   Ngetich, Dinah C, NP  metoCLOPramide (REGLAN) 10 MG/10ML SOLN Take 5 mLs (5 mg total) by mouth 4 (four) times daily -  before meals and at bedtime for 14 days. 11/07/21 01/15/22  Donne Hazel, MD  metoprolol tartrate (LOPRESSOR) 100 MG tablet TAKE 1 TABLET BY MOUTH TWICE DAILY (BREAKFAST, BEDTIME) 05/23/21   Ngetich, Dinah C, NP  nystatin cream (MYCOSTATIN) Apply 1 Application topically 2 (two) times daily. Affected areas on breast fold and groin and sacral areas 07/31/21   Ngetich, Dinah C, NP  olmesartan-hydrochlorothiazide (BENICAR HCT) 40-25 MG tablet TAKE 1 TABLET BY MOUTH DAILY 04/29/22   Skeet Latch, MD  pantoprazole (PROTONIX) 40 MG tablet Take 1 tablet (40 mg total) by mouth every morning. 11/22/21   Medina-Vargas, Monina C, NP  polyethylene glycol powder (GLYCOLAX/MIRALAX) 17 GM/SCOOP powder Take 17 g by mouth daily. Hold for loose stool 01/23/22   Ngetich, Dinah C, NP  senna (SENOKOT) 8.6 MG tablet Take 8.6 mg by mouth as needed for constipation.    [provider]  sulfamethoxazole-trimethoprim (BACTRIM DS) 800-160 MG tablet Take 1 tablet by mouth 2 (two) times daily. 04/18/22   Ngetich, Dinah C, NP  tamsulosin (FLOMAX) 0.4 MG CAPS capsule TAKE 1 CAPSULE BY MOUTH AT BEDTIME 05/23/21   Ngetich, Dinah C, NP  topiramate (TOPAMAX) 25 MG tablet TAKE 1 TABLET BY MOUTH TWICE DAILY FOR NEUROPATHY 12/24/21   Ngetich, Dinah C, NP  zinc oxide (BALMEX) 11.3 % CREA cream Apply 1 Application topically 2 (two) times daily. 07/31/21   Ngetich, Nelda Bucks, NP    Physical  Exam: Vitals:   04/30/22 1517 04/30/22 1518 04/30/22 1521 04/30/22 1545  BP:    120/80  Pulse: 72 70  88  Resp: 11 14  (!) 23  Temp:   98 F (36.7 C)   TempSrc:   Oral   SpO2: 100% 96%  100%    Physical Exam Constitutional:      General: She is not in acute distress.    Appearance: Normal appearance.  HENT:     Head: Normocephalic and atraumatic.     Mouth/Throat:     Mouth: Mucous membranes are moist.     Pharynx: Oropharynx is clear.  Eyes:     Extraocular Movements: Extraocular movements intact.     Pupils: Pupils are equal, round, and reactive to light.  Cardiovascular:     Rate and Rhythm: Normal rate and regular rhythm.     Pulses: Normal pulses.     Heart sounds: Normal heart sounds.  Pulmonary:     Effort: Pulmonary effort is normal. No respiratory distress.     Breath sounds: Normal breath sounds.  Abdominal:     General: Bowel sounds are normal. There is no distension.     Palpations: Abdomen is soft.     Tenderness: There is no abdominal tenderness.  Musculoskeletal:        General: No swelling or deformity.  Skin:    General: Skin is warm and dry.  Neurological:     Mental Status: Mental status is at baseline.     Comments: Chronic residual deficits.    Labs on Admission: I have personally reviewed following labs and imaging studies  CBC: Recent Labs  Lab 04/30/22 1245 04/30/22 1508 04/30/22 1525  WBC 6.7  --   --   NEUTROABS 4.4  --   --   HGB 13.6 14.3 14.3  HCT 40.7 42.0 42.0  MCV 77.5*  --   --   PLT 92*  --   --     Basic Metabolic Panel: Recent Labs  Lab 04/30/22 1128 04/30/22 1450 04/30/22 1508 04/30/22 1525  NA 128*  --  130*  129*  K 6.9* 7.3* 7.0* 7.0*  CL 95*  --  99  --   CO2 19*  --   --   --   GLUCOSE 313*  --  288*  --   BUN 66*  --  64*  --   CREATININE 2.71*  --  2.80*  --   CALCIUM 9.3  --   --   --     GFR: CrCl cannot be calculated (Unknown ideal weight.).  Liver Function Tests: Recent Labs  Lab  04/30/22 1128  AST 13*  ALT 17  ALKPHOS 110  BILITOT 0.7  PROT 7.5  ALBUMIN 3.8    Urine analysis:    Component Value Date/Time   COLORURINE YELLOW 11/03/2021 0613   APPEARANCEUR CLEAR 11/03/2021 0613   LABSPEC <1.005 (L) 11/03/2021 0613   PHURINE 6.5 11/03/2021 0613   GLUCOSEU 250 (A) 11/03/2021 0613   HGBUR NEGATIVE 11/03/2021 0613   BILIRUBINUR NEGATIVE 11/03/2021 0613   KETONESUR 15 (A) 11/03/2021 0613   PROTEINUR NEGATIVE 11/03/2021 0613   NITRITE NEGATIVE 11/03/2021 0613   LEUKOCYTESUR SMALL (A) 11/03/2021 0613    Radiological Exams on Admission: US RENAL  Result Date: 04/30/2022 CLINICAL DATA:  Acute kidney injury. EXAM: RENAL / URINARY TRACT ULTRASOUND COMPLETE COMPARISON:  CT AP 07/10/2021 FINDINGS: Right Kidney: Renal measurements: 10.0 x 4.4 x 3.7 cm = volume: 85.5 mL. Echogenicity within normal limits. No mass or hydronephrosis visualized. Left Kidney: Renal measurements: 9.8 x 5.3 x 5.4 cm = volume: 145.8 mL. Echogenicity within normal limits. No mass or hydronephrosis visualized. Bladder: Appears normal for degree of bladder distention. Other: Exam detail is diminished secondary to patient body habitus. IMPRESSION: No acute abnormality.  No signs of obstructive uropathy. Electronically Signed   By: Kerby Moors M.D.   On: 04/30/2022 15:43   CT Head Wo Contrast  Result Date: 04/30/2022 CLINICAL DATA:  Left-sided weakness, acute stroke suspected, previous history of stroke with left-sided weakness EXAM: CT HEAD WITHOUT CONTRAST TECHNIQUE: Contiguous axial images were obtained from the base of the skull through the vertex without intravenous contrast. RADIATION DOSE REDUCTION: This exam was performed according to the departmental dose-optimization program which includes automated exposure control, adjustment of the mA and/or kV according to patient size and/or use of iterative reconstruction technique. COMPARISON:  11/03/2021 FINDINGS: Brain: No evidence of acute  infarction, hemorrhage, hydrocephalus, extra-axial collection or mass lesion/mass effect. Periventricular and deep white matter hypodensity. Unchanged small bilateral lacunar infarctions of the basal ganglia (series 3, image 20). Unchanged encephalomalacia of the left frontal lobe (series 3, image 16) and left cerebellar hemisphere (series 3, image 26). Vascular: No hyperdense vessel or unexpected calcification. Skull: Normal. Negative for fracture or focal lesion. Sinuses/Orbits: No acute finding. Other: None. IMPRESSION: 1. No CT evidence of acute intracranial pathology. 2. Small-vessel white matter disease significantly advanced for patient age. 3. Unchanged small bilateral lacunar infarctions of the basal ganglia. Unchanged encephalomalacia of the left frontal lobe and left cerebellar hemisphere. 4. Consider MRI to more sensitively evaluate for acute diffusion restricting infarction if clinically suspected. Electronically Signed   By: Delanna Ahmadi M.D.   On: 04/30/2022 13:00   DG Chest Portable 1 View  Result Date: 04/30/2022 CLINICAL DATA:  Hypoxia EXAM: PORTABLE CHEST 1 VIEW COMPARISON:  CXR 11/03/21 FINDINGS: No pleural effusion. No pneumothorax. Low lung volumes. Left basilar atelectasis. Likely unchanged cardiac and mediastinal contours when accounting for differences in lung volumes. Status post median sternotomy. No focal airspace opacity. No radiographically  apparent displaced rib fractures. Visualized upper abdomen is unremarkable. Surgical clips in the right upper quadrant IMPRESSION: Low lung volumes likely left basilar atelectasis. Electronically Signed   By: Marin Roberts M.D.   On: 04/30/2022 11:58    EKG: Independently reviewed. Sinus rhythm at 76 bpm.  No evidence of peaked T waves, QRS noted to be 86.  Assessment/Plan Principal Problem:   AKI (acute kidney injury) ([place]) Active Problems:   Type 2 diabetes mellitus with hyperlipidemia (HCC)   Essential hypertension   History of  cerebral infarction   Hyperthyroidism   CAD (coronary artery disease)   Anxiety and depression   HLD (hyperlipidemia)   Spastic hemiparesis of left 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 ([facility])   GERD (gastroesophageal reflux disease)   Hyponatremia   Hyperkalemia   AKI Hyperkalemia Hyponatremia > Patient noted to have abnormal renal function and electrolytes in the ED.  Creatinine elevated to 2.71 from baseline 0.7.  Sodium 128, potassium 6.9. > Received insulin, calcium gluconate, albuterol, Lokelma in the ED.  Also received 2 L of IV fluids. > Likely in the setting of decreased p.o. intake exacerbated by olmesartan/hydrochlorothiazide. - Monitor in progressive unit - Trend renal function and electrolytes every 4 hours - Continue with Lokelma 10 mg every 6 hours - Continue with IV fluids - Hold olmesartan-hydrochlorothiazide  Altered mental status > Per HPI patient noted to be more drowsy and lethargic today.  Could be in the setting of polypharmacy combined with AKI above. - Will hold centrally acting medications including baclofen, Flexeril, gabapentin, Topamax, Lexapro, Ativan - Correct renal function and electrolytes as above  History of CVA Left-sided spastic hemiplegia Aphasia > As per HPI history of previous stroke with residual deficits on the left and aphasia.  Can get up and walk with assistance at times but is largely bed and wheelchair bound.  Altered mental status today as above, likely polypharmacy combined with AKI/dehydration. - Continue home Plavix, atorvastatin, Zetia - Up with assistance  Diabetes > 38 units daily at home with 7 units with meals. - 20 units daily - SSI  Hyperlipidemia - Continue home atorvastatin and Zetia  GERD - Continue home PPI  CAD > S/P CABG - Continue home metoprolol, Zetia, atorvastatin  Hypertension - Continue home amlodipine, metoprolol - Holding home olmesartan/hydrochlorothiazide as  above.  Depression Anxiety - Holding Lexapro and Ativan for now.  DVT prophylaxis: SCDs (low platlets on Plavix) Code Status:   Full Family Communication:  None on admission, attempted to reach spouse by phone, but there was no answer.  EDP discussed with spouse in the ED per chart review..  Disposition Plan:   Patient is from:  Home  Anticipated DC to:  Home  Anticipated DC date:  1 to 3 days  Anticipated DC barriers: None  Consults called:  None Admission status:  Observation, progressive  Severity of Illness: The appropriate patient status for this patient is OBSERVATION. Observation status is judged to be reasonable and necessary in order to provide the required intensity of service to ensure the patient's safety. The patient's presenting symptoms, physical exam findings, and initial radiographic and laboratory data in the context of their medical condition is felt to place them at decreased risk for further clinical deterioration. Furthermore, it is anticipated that the patient will be medically stable for discharge from the hospital within 2 midnights of admission.    Marcelyn Bruins MD [facility]  How to contact the [facility] Attending or Consulting provider 7A -  7P or covering provider during after hours [place], for this patient?   Check the care team in [facility] and look for a) attending/consulting [facility] provider listed and b) the [facility] team listed Log into [email or web address] and use [place]'s universal password to access. If you do not have the password, please contact the hospital operator. Locate the [facility] provider you are looking for under [facility] and page to a number that you can be directly reached. If you still have difficulty reaching the provider, please page the [facility] (Director on Call) for the Hospitalists listed on amion for assistance.  04/30/2022, 4:14 PM

## 2022-04-30 NOTE — ED Notes (Signed)
Notified Mo RN and Tyrone Nine MD of K 7.3

## 2022-04-30 NOTE — Progress Notes (Signed)
Patient brought to 4E from ED. VSS. Telemetry box applied, CCMD notified. Patient oriented to room and staff. Call bell in reach. ° °Muneeb Veras L Jahi Roza, RN  °

## 2022-05-01 DIAGNOSIS — E0590 Thyrotoxicosis, unspecified without thyrotoxic crisis or storm: Secondary | ICD-10-CM | POA: Diagnosis present

## 2022-05-01 DIAGNOSIS — E860 Dehydration: Secondary | ICD-10-CM | POA: Diagnosis present

## 2022-05-01 DIAGNOSIS — Z794 Long term (current) use of insulin: Secondary | ICD-10-CM | POA: Diagnosis not present

## 2022-05-01 DIAGNOSIS — E1143 Type 2 diabetes mellitus with diabetic autonomic (poly)neuropathy: Secondary | ICD-10-CM | POA: Diagnosis present

## 2022-05-01 DIAGNOSIS — K219 Gastro-esophageal reflux disease without esophagitis: Secondary | ICD-10-CM | POA: Diagnosis present

## 2022-05-01 DIAGNOSIS — I69354 Hemiplegia and hemiparesis following cerebral infarction affecting left non-dominant side: Secondary | ICD-10-CM | POA: Diagnosis not present

## 2022-05-01 DIAGNOSIS — I10 Essential (primary) hypertension: Secondary | ICD-10-CM | POA: Diagnosis present

## 2022-05-01 DIAGNOSIS — E785 Hyperlipidemia, unspecified: Secondary | ICD-10-CM | POA: Diagnosis present

## 2022-05-01 DIAGNOSIS — J9811 Atelectasis: Secondary | ICD-10-CM | POA: Diagnosis present

## 2022-05-01 DIAGNOSIS — E1169 Type 2 diabetes mellitus with other specified complication: Secondary | ICD-10-CM | POA: Diagnosis present

## 2022-05-01 DIAGNOSIS — N179 Acute kidney failure, unspecified: Secondary | ICD-10-CM | POA: Diagnosis not present

## 2022-05-01 DIAGNOSIS — F32A Depression, unspecified: Secondary | ICD-10-CM | POA: Diagnosis present

## 2022-05-01 DIAGNOSIS — E871 Hypo-osmolality and hyponatremia: Secondary | ICD-10-CM | POA: Diagnosis present

## 2022-05-01 DIAGNOSIS — Z951 Presence of aortocoronary bypass graft: Secondary | ICD-10-CM | POA: Diagnosis not present

## 2022-05-01 DIAGNOSIS — R5383 Other fatigue: Secondary | ICD-10-CM

## 2022-05-01 DIAGNOSIS — Z6837 Body mass index (BMI) 37.0-37.9, adult: Secondary | ICD-10-CM | POA: Diagnosis not present

## 2022-05-01 DIAGNOSIS — Z993 Dependence on wheelchair: Secondary | ICD-10-CM | POA: Diagnosis not present

## 2022-05-01 DIAGNOSIS — Y92009 Unspecified place in unspecified non-institutional (private) residence as the place of occurrence of the external cause: Secondary | ICD-10-CM | POA: Diagnosis not present

## 2022-05-01 DIAGNOSIS — Z1152 Encounter for screening for COVID-19: Secondary | ICD-10-CM | POA: Diagnosis not present

## 2022-05-01 DIAGNOSIS — E875 Hyperkalemia: Secondary | ICD-10-CM | POA: Diagnosis present

## 2022-05-01 DIAGNOSIS — E1140 Type 2 diabetes mellitus with diabetic neuropathy, unspecified: Secondary | ICD-10-CM | POA: Diagnosis present

## 2022-05-01 DIAGNOSIS — G929 Unspecified toxic encephalopathy: Secondary | ICD-10-CM | POA: Diagnosis present

## 2022-05-01 DIAGNOSIS — E1165 Type 2 diabetes mellitus with hyperglycemia: Secondary | ICD-10-CM | POA: Diagnosis present

## 2022-05-01 DIAGNOSIS — E669 Obesity, unspecified: Secondary | ICD-10-CM | POA: Diagnosis present

## 2022-05-01 DIAGNOSIS — E039 Hypothyroidism, unspecified: Secondary | ICD-10-CM | POA: Diagnosis present

## 2022-05-01 DIAGNOSIS — I69320 Aphasia following cerebral infarction: Secondary | ICD-10-CM | POA: Diagnosis not present

## 2022-05-01 LAB — BASIC METABOLIC PANEL
Anion gap: 12 (ref 5–15)
Anion gap: 12 (ref 5–15)
BUN: 45 mg/dL — ABNORMAL HIGH (ref 6–20)
BUN: 43 mg/dL — ABNORMAL HIGH (ref 6–20)
CO2: 21 mmol/L — ABNORMAL LOW (ref 22–32)
CO2: 21 mmol/L — ABNORMAL LOW (ref 22–32)
Calcium: 9.4 mg/dL (ref 8.9–10.3)
Calcium: 8.9 mg/dL (ref 8.9–10.3)
Chloride: 99 mmol/L (ref 98–111)
Chloride: 96 mmol/L — ABNORMAL LOW (ref 98–111)
Creatinine, Ser: 1.62 mg/dL — ABNORMAL HIGH (ref 0.44–1.00)
Creatinine, Ser: 1.55 mg/dL — ABNORMAL HIGH (ref 0.44–1.00)
GFR, Estimated: 39 mL/min — ABNORMAL LOW (ref >60)
GFR, Estimated: 41 mL/min — ABNORMAL LOW (ref >60)
Glucose, Bld: 137 mg/dL — ABNORMAL HIGH (ref 70–99)
Glucose, Bld: 231 mg/dL — ABNORMAL HIGH (ref 70–99)
Potassium: 4.3 mmol/L (ref 3.5–5.1)
Potassium: 4.7 mmol/L (ref 3.5–5.1)
Sodium: 132 mmol/L — ABNORMAL LOW (ref 135–145)
Sodium: 129 mmol/L — ABNORMAL LOW (ref 135–145)

## 2022-05-01 LAB — COMPREHENSIVE METABOLIC PANEL
ALT: 13 U/L (ref 0–44)
AST: 11 U/L — ABNORMAL LOW (ref 15–41)
Albumin: 3.2 g/dL — ABNORMAL LOW (ref 3.5–5.0)
Alkaline Phosphatase: 89 U/L (ref 38–126)
Anion gap: 14 (ref 5–15)
BUN: 52 mg/dL — ABNORMAL HIGH (ref 6–20)
CO2: 20 mmol/L — ABNORMAL LOW (ref 22–32)
Calcium: 9.1 mg/dL (ref 8.9–10.3)
Chloride: 97 mmol/L — ABNORMAL LOW (ref 98–111)
Creatinine, Ser: 1.81 mg/dL — ABNORMAL HIGH (ref 0.44–1.00)
GFR, Estimated: 34 mL/min — ABNORMAL LOW (ref >60)
Glucose, Bld: 165 mg/dL — ABNORMAL HIGH (ref 70–99)
Potassium: 4.3 mmol/L (ref 3.5–5.1)
Sodium: 131 mmol/L — ABNORMAL LOW (ref 135–145)
Total Bilirubin: 0.8 mg/dL (ref 0.3–1.2)
Total Protein: 6.6 g/dL (ref 6.5–8.1)

## 2022-05-01 LAB — GLUCOSE, CAPILLARY
Glucose-Capillary: 174 mg/dL — ABNORMAL HIGH (ref 70–99)
Glucose-Capillary: 230 mg/dL — ABNORMAL HIGH (ref 70–99)
Glucose-Capillary: 236 mg/dL — ABNORMAL HIGH (ref 70–99)
Glucose-Capillary: 291 mg/dL — ABNORMAL HIGH (ref 70–99)

## 2022-05-01 LAB — CBC
HCT: 34.9 % — ABNORMAL LOW (ref 36.0–46.0)
Hemoglobin: 12 g/dL (ref 12.0–15.0)
MCH: 25.9 pg — ABNORMAL LOW (ref 26.0–34.0)
MCHC: 34.4 g/dL (ref 30.0–36.0)
MCV: 75.2 fL — ABNORMAL LOW (ref 80.0–100.0)
Platelets: 370 10*3/uL (ref 150–400)
RBC: 4.64 MIL/uL (ref 3.87–5.11)
RDW: 14.8 % (ref 11.5–15.5)
WBC: 8.9 10*3/uL (ref 4.0–10.5)
nRBC: 0 % (ref 0.0–0.2)

## 2022-05-01 LAB — PROTIME-INR
INR: 1 (ref 0.8–1.2)
Prothrombin Time: 13.3 seconds (ref 11.4–15.2)

## 2022-05-01 LAB — APTT: aPTT: 32 seconds (ref 24–36)

## 2022-05-01 MED ORDER — POLYETHYLENE GLYCOL 3350 17 G PO PACK
17.0000 g | PACK | Freq: Once | ORAL | Status: AC
  Administered 2022-05-01: 17 g via ORAL
  Filled 2022-05-01: qty 1

## 2022-05-01 MED ORDER — ESCITALOPRAM OXALATE 10 MG PO TABS
10.0000 mg | ORAL_TABLET | Freq: Every day | ORAL | Status: DC
  Administered 2022-05-01 – 2022-05-02 (×2): 10 mg via ORAL
  Filled 2022-05-01 (×2): qty 1

## 2022-05-01 MED ORDER — DICLOFENAC SODIUM 1 % EX GEL
2.0000 g | Freq: Four times a day (QID) | CUTANEOUS | Status: DC | PRN
  Administered 2022-05-01: 2 g via TOPICAL
  Filled 2022-05-01: qty 100

## 2022-05-01 MED ORDER — HEPARIN SODIUM (PORCINE) 5000 UNIT/ML IJ SOLN
5000.0000 [IU] | Freq: Three times a day (TID) | INTRAMUSCULAR | Status: DC
  Filled 2022-05-01 (×3): qty 1

## 2022-05-01 NOTE — Assessment & Plan Note (Signed)
Acute kidney injury has improved.  Patient's ultrasound of the renal is normal.  Patient patient history I suspect that this was a prerenal presentation.  Due to patient's not taking oral intake.  From encephalopathy

## 2022-05-01 NOTE — Assessment & Plan Note (Addendum)
To be toxic encephalopathy as patient describes taking several psychoactive medications at once.  Patient claims that that this was administered to her by her aide.  Will have to have a discussion, to take as needed meds 1 at a time when indications overlap.  I will restart Lexapro and monitor patient

## 2022-05-01 NOTE — Assessment & Plan Note (Signed)
This has resolved, recheck BMP in the morning.  This was likely due to acute kidney injury.

## 2022-05-01 NOTE — Progress Notes (Signed)
Progress Note   Patient: Lori [name] [id number] DOB: 20-Apr-1970 DOA: 04/30/2022     0 DOS: the patient was seen and examined on 05/01/2022   Brief hospital course: No notes on file  Assessment and Plan: * AKI (acute kidney injury) ([place]) Acute kidney injury has improved.  Patient's ultrasound of the renal is normal.  Patient patient history I suspect that this was a prerenal presentation.  Due to patient's not taking oral intake.  From encephalopathy  Lethargy To be toxic encephalopathy as patient describes taking several psychoactive medications at once.  Patient claims that that this was administered to her by her aide.  Will have to have a discussion, to take as needed meds 1 at a time when indications overlap.  I will restart Lexapro and monitor patient  Hyperkalemia This has resolved, recheck BMP in the morning.  This was likely due to acute kidney injury.        Subjective: Patient reports feeling "better ".  Because she is able to talk now.  Patient recalls that she was very sleepy for the last 48 hours at home.  She ascribes this to taking several of her home medications together.  Patient is feeling constipated.  Denies having nausea vomiting or diarrhea at home.  Patient claims that because she was not waking up, she skipped a lot of her meals and drinks at home.  Physical Exam: Vitals:   05/01/22 0545 05/01/22 0730 05/01/22 0746 05/01/22 1231  BP: 112/78  105/75 109/78  Pulse: 80 79 81 87  Resp: 15 15 14 17   Temp: 97.6 F (36.4 C)  (!) 97.4 F (36.3 C) 98.3 F (36.8 C)  TempSrc: Oral  Oral Oral  SpO2: 96% 98% 98% 99%  Weight: 97.2 kg      General: Patient is alert, speaking slowly but clearly and having a fairly coherent conversation Neurologic exam bilateral paraplegia, left upper extremity plegia.  Antigravity strength in right upper extremity present, patient is fairly oriented as gauged from her history Respiratory exam: Bilateral  intravesicular Cardiovascular exam S1-S2 normal Abdomen: Obese soft nontender Extremities warm without edema Data Reviewed:  Labs on Admission:  Results for orders placed or performed during the hospital encounter of 04/30/22 (from the past 24 hour(s))  Troponin I (High Sensitivity)     Status: None   Collection Time: 04/30/22  2:50 PM  Result Value Ref Range   Troponin I (High Sensitivity) 4 <18 ng/L  Potassium     Status: Abnormal   Collection Time: 04/30/22  2:50 PM  Result Value Ref Range   Potassium 7.3 (HH) 3.5 - 5.1 mmol/L  I-stat chem 8, ED (not at [facility], [facility] or [place])     Status: Abnormal   Collection Time: 04/30/22  3:08 PM  Result Value Ref Range   Sodium 130 (L) 135 - 145 mmol/L   Potassium 7.0 (HH) 3.5 - 5.1 mmol/L   Chloride 99 98 - 111 mmol/L   BUN 64 (H) 6 - 20 mg/dL   Creatinine, Ser 2.80 (H) 0.44 - 1.00 mg/dL   Glucose, Bld 288 (H) 70 - 99 mg/dL   Calcium, Ion 1.13 (L) 1.15 - 1.40 mmol/L   TCO2 25 22 - 32 mmol/L   Hemoglobin 14.3 12.0 - 15.0 g/dL   HCT 42.0 36.0 - 46.0 %   Comment NOTIFIED PHYSICIAN   I-Stat venous blood gas, ED     Status: Abnormal   Collection Time: 04/30/22  3:25 PM  Result Value Ref  Range   pH, Ven 7.272 7.25 - 7.43   pCO2, Ven 53.4 44 - 60 mmHg   pO2, Ven 56 (H) 32 - 45 mmHg   Bicarbonate 24.6 20.0 - 28.0 mmol/L   TCO2 26 22 - 32 mmol/L   O2 Saturation 84 %   Acid-base deficit 3.0 (H) 0.0 - 2.0 mmol/L   Sodium 129 (L) 135 - 145 mmol/L   Potassium 7.0 (HH) 3.5 - 5.1 mmol/L   Calcium, Ion 1.17 1.15 - 1.40 mmol/L   HCT 42.0 36.0 - 46.0 %   Hemoglobin 14.3 12.0 - 15.0 g/dL   Sample type VENOUS    Comment NOTIFIED PHYSICIAN   CBG monitoring, ED     Status: Abnormal   Collection Time: 04/30/22  3:35 PM  Result Value Ref Range   Glucose-Capillary 274 (H) 70 - 99 mg/dL  CBG monitoring, ED     Status: Abnormal   Collection Time: 04/30/22  4:16 PM  Result Value Ref Range   Glucose-Capillary 224 (H) 70 - 99 mg/dL  CBG monitoring, ED      Status: Abnormal   Collection Time: 04/30/22  5:46 PM  Result Value Ref Range   Glucose-Capillary 200 (H) 70 - 99 mg/dL  Blood gas, venous (at [facility] and [facility])     Status: None   Collection Time: 04/30/22  7:19 PM  Result Value Ref Range   pH, Ven 7.36 7.25 - 7.43   pCO2, Ven 44 44 - 60 mmHg   pO2, Ven 34 32 - 45 mmHg   Bicarbonate 24.8 20.0 - 28.0 mmol/L   Acid-base deficit 1.1 0.0 - 2.0 mmol/L   O2 Saturation 62.9 %   Patient temperature 36.3   HIV Antibody (routine testing w rflx)     Status: None   Collection Time: 04/30/22  7:19 PM  Result Value Ref Range   HIV Screen 4th Generation wRfx Non Reactive Non Reactive  Basic metabolic panel     Status: Abnormal   Collection Time: 04/30/22  7:19 PM  Result Value Ref Range   Sodium 132 (L) 135 - 145 mmol/L   Potassium 6.5 (HH) 3.5 - 5.1 mmol/L   Chloride 99 98 - 111 mmol/L   CO2 18 (L) 22 - 32 mmol/L   Glucose, Bld 177 (H) 70 - 99 mg/dL   BUN 61 (H) 6 - 20 mg/dL   Creatinine, Ser 2.15 (H) 0.44 - 1.00 mg/dL   Calcium 9.5 8.9 - 10.3 mg/dL   GFR, Estimated 28 (L) >60 mL/min   Anion gap 15 5 - 15  Glucose, capillary     Status: Abnormal   Collection Time: 04/30/22 10:04 PM  Result Value Ref Range   Glucose-Capillary 194 (H) 70 - 99 mg/dL  Urinalysis, Routine w reflex microscopic -Urine, Clean Catch     Status: Abnormal   Collection Time: 04/30/22 10:17 PM  Result Value Ref Range   Color, Urine YELLOW YELLOW   APPearance HAZY (A) CLEAR   Specific Gravity, Urine 1.011 1.005 - 1.030   pH 5.0 5.0 - 8.0   Glucose, UA NEGATIVE NEGATIVE mg/dL   Hgb urine dipstick NEGATIVE NEGATIVE   Bilirubin Urine NEGATIVE NEGATIVE   Ketones, ur NEGATIVE NEGATIVE mg/dL   Protein, ur NEGATIVE NEGATIVE mg/dL   Nitrite NEGATIVE NEGATIVE   Leukocytes,Ua LARGE (A) NEGATIVE   RBC / HPF 0-5 0 - 5 RBC/hpf   WBC, UA 11-20 0 - 5 WBC/hpf   Bacteria, UA FEW (A) NONE SEEN  Squamous Epithelial / HPF 6-10 0 - 5 /HPF  CBC     Status: Abnormal   Collection Time:  05/01/22  2:00 AM  Result Value Ref Range   WBC 8.9 4.0 - 10.5 K/uL   RBC 4.64 3.87 - 5.11 MIL/uL   Hemoglobin 12.0 12.0 - 15.0 g/dL   HCT 34.9 (L) 36.0 - 46.0 %   MCV 75.2 (L) 80.0 - 100.0 fL   MCH 25.9 (L) 26.0 - 34.0 pg   MCHC 34.4 30.0 - 36.0 g/dL   RDW 14.8 11.5 - 15.5 %   Platelets 370 150 - 400 K/uL   nRBC 0.0 0.0 - 0.2 %  Comprehensive metabolic panel     Status: Abnormal   Collection Time: 05/01/22  2:00 AM  Result Value Ref Range   Sodium 131 (L) 135 - 145 mmol/L   Potassium 4.3 3.5 - 5.1 mmol/L   Chloride 97 (L) 98 - 111 mmol/L   CO2 20 (L) 22 - 32 mmol/L   Glucose, Bld 165 (H) 70 - 99 mg/dL   BUN 52 (H) 6 - 20 mg/dL   Creatinine, Ser 1.81 (H) 0.44 - 1.00 mg/dL   Calcium 9.1 8.9 - 10.3 mg/dL   Total Protein 6.6 6.5 - 8.1 g/dL   Albumin 3.2 (L) 3.5 - 5.0 g/dL   AST 11 (L) 15 - 41 U/L   ALT 13 0 - 44 U/L   Alkaline Phosphatase 89 38 - 126 U/L   Total Bilirubin 0.8 0.3 - 1.2 mg/dL   GFR, Estimated 34 (L) >60 mL/min   Anion gap 14 5 - 15  Glucose, capillary     Status: Abnormal   Collection Time: 05/01/22  5:45 AM  Result Value Ref Range   Glucose-Capillary 174 (H) 70 - 99 mg/dL   Comment 1 Notify RN    Comment 2 Document in Chart   Basic metabolic panel     Status: Abnormal   Collection Time: 05/01/22  8:37 AM  Result Value Ref Range   Sodium 132 (L) 135 - 145 mmol/L   Potassium 4.3 3.5 - 5.1 mmol/L   Chloride 99 98 - 111 mmol/L   CO2 21 (L) 22 - 32 mmol/L   Glucose, Bld 137 (H) 70 - 99 mg/dL   BUN 45 (H) 6 - 20 mg/dL   Creatinine, Ser 1.62 (H) 0.44 - 1.00 mg/dL   Calcium 9.4 8.9 - 10.3 mg/dL   GFR, Estimated 39 (L) >60 mL/min   Anion gap 12 5 - 15  LAB REPORT - SCANNED     Status: None   Collection Time: 05/01/22 10:02 AM   Narrative   Ordered by an unspecified provider.  Glucose, capillary     Status: Abnormal   Collection Time: 05/01/22 12:30 PM  Result Value Ref Range   Glucose-Capillary 230 (H) 70 - 99 mg/dL   Basic Metabolic Panel: Recent Labs   Lab 04/30/22 1128 04/30/22 1450 04/30/22 1508 04/30/22 1525 04/30/22 1919 05/01/22 0200 05/01/22 0837  NA 128*  --  130* 129* 132* 131* 132*  K 6.9*   < > 7.0* 7.0* 6.5* 4.3 4.3  CL 95*  --  99  --  99 97* 99  CO2 19*  --   --   --  18* 20* 21*  GLUCOSE 313*  --  288*  --  177* 165* 137*  BUN 66*  --  64*  --  61* 52* 45*  CREATININE 2.71*  --  2.80*  --  2.15* 1.81* 1.62*  CALCIUM 9.3  --   --   --  9.5 9.1 9.4   < > = values in this interval not displayed.   Liver Function Tests: Recent Labs  Lab 04/30/22 1128 05/01/22 0200  AST 13* 11*  ALT 17 13  ALKPHOS 110 89  BILITOT 0.7 0.8  PROT 7.5 6.6  ALBUMIN 3.8 3.2*   No results for input(s): "LIPASE", "AMYLASE" in the last 168 hours. No results for input(s): "AMMONIA" in the last 168 hours. CBC: Recent Labs  Lab 04/30/22 1245 04/30/22 1508 04/30/22 1525 05/01/22 0200  WBC 6.7  --   --  8.9  NEUTROABS 4.4  --   --   --   HGB 13.6 14.3 14.3 12.0  HCT 40.7 42.0 42.0 34.9*  MCV 77.5*  --   --  75.2*  PLT 92*  --   --  370   Cardiac Enzymes: Recent Labs  Lab 04/30/22 1128 04/30/22 1450  TROPONINIHS 4 4    BNP (last 3 results) No results for input(s): "PROBNP" in the last 8760 hours. CBG: Recent Labs  Lab 04/30/22 1616 04/30/22 1746 04/30/22 2204 05/01/22 0545 05/01/22 1230  GLUCAP 224* 200* 194* 174* 230*    Radiological Exams on Admission:  US RENAL  Result Date: 04/30/2022 CLINICAL DATA:  Acute kidney injury. EXAM: RENAL / URINARY TRACT ULTRASOUND COMPLETE COMPARISON:  CT [facility] 07/10/2021 FINDINGS: Right Kidney: Renal measurements: 10.0 x 4.4 x 3.7 cm = volume: 85.5 mL. Echogenicity within normal limits. No mass or hydronephrosis visualized. Left Kidney: Renal measurements: 9.8 x 5.3 x 5.4 cm = volume: 145.8 mL. Echogenicity within normal limits. No mass or hydronephrosis visualized. Bladder: Appears normal for degree of bladder distention. Other: Exam detail is diminished secondary to patient body  habitus. IMPRESSION: No acute abnormality.  No signs of obstructive uropathy. Electronically Signed   By: Kerby Moors M.D.   On: 04/30/2022 15:43   CT Head Wo Contrast  Result Date: 04/30/2022 CLINICAL DATA:  Left-sided weakness, acute stroke suspected, previous history of stroke with left-sided weakness EXAM: CT HEAD WITHOUT CONTRAST TECHNIQUE: Contiguous axial images were obtained from the base of the skull through the vertex without intravenous contrast. RADIATION DOSE REDUCTION: This exam was performed according to the departmental dose-optimization program which includes automated exposure control, adjustment of the mA and/or kV according to patient size and/or use of iterative reconstruction technique. COMPARISON:  11/03/2021 FINDINGS: Brain: No evidence of acute infarction, hemorrhage, hydrocephalus, extra-axial collection or mass lesion/mass effect. Periventricular and deep white matter hypodensity. Unchanged small bilateral lacunar infarctions of the basal ganglia (series 3, image 20). Unchanged encephalomalacia of the left frontal lobe (series 3, image 16) and left cerebellar hemisphere (series 3, image 26). Vascular: No hyperdense vessel or unexpected calcification. Skull: Normal. Negative for fracture or focal lesion. Sinuses/Orbits: No acute finding. Other: None. IMPRESSION: 1. No CT evidence of acute intracranial pathology. 2. Small-vessel white matter disease significantly advanced for patient age. 3. Unchanged small bilateral lacunar infarctions of the basal ganglia. Unchanged encephalomalacia of the left frontal lobe and left cerebellar hemisphere. 4. Consider MRI to more sensitively evaluate for acute diffusion restricting infarction if clinically suspected. Electronically Signed   By: Delanna Ahmadi M.D.   On: 04/30/2022 13:00   DG Chest Portable 1 View  Result Date: 04/30/2022 CLINICAL DATA:  Hypoxia EXAM: PORTABLE CHEST 1 VIEW COMPARISON:  CXR 11/03/21 FINDINGS: No pleural effusion. No  pneumothorax. Low lung volumes. Left basilar atelectasis. Likely unchanged  cardiac and mediastinal contours when accounting for differences in lung volumes. Status post median sternotomy. No focal airspace opacity. No radiographically apparent displaced rib fractures. Visualized upper abdomen is unremarkable. Surgical clips in the right upper quadrant IMPRESSION: Low lung volumes likely left basilar atelectasis. Electronically Signed   By: Marin Roberts M.D.   On: 04/30/2022 11:58      Family Communication: per patient  Disposition: Status is: Observation The patient will require care spanning > 2 midnights and should be moved to inpatient because: creatinne still not normalised.  Planned Discharge Destination: Home    Time spent: 30 minutes  Author: Gertie Fey, MD 05/01/2022 2:36 PM  For on call review [email or web address].

## 2022-05-01 NOTE — Progress Notes (Signed)
TR[facility] night cross cover note:   In following-up on patient's serum potassium and sodium levels:  CMP drawn at 2 AM reflects interval improvement in previous hyperkalemia, with potassium level now noted to be 4.3 relative to most recent prior value of 6.5.  Additionally, this updated BMP reflects sodium of 131, which corrects to approximately 132 when taking into account concomitant mild hyperglycemia, which is relative to sodium level of 128 at 11:30 AM.  Overall, she has experienced a 4 point improvement in her mild hyponatremia over the course of the last 14 hours with the existing intervention.  Will not make any changes to existing orders at this time, noting normal saline running at 100 cc/h.  She has orders for every 4 hours BMPs, with next to be checked around 6 AM.  Will continue to monitor strict I's and O's and monitor ensuing sodium trend accordingly.     Babs Bertin, DO Hospitalist

## 2022-05-02 DIAGNOSIS — N179 Acute kidney failure, unspecified: Secondary | ICD-10-CM | POA: Diagnosis not present

## 2022-05-02 DIAGNOSIS — E1169 Type 2 diabetes mellitus with other specified complication: Secondary | ICD-10-CM | POA: Diagnosis not present

## 2022-05-02 DIAGNOSIS — E785 Hyperlipidemia, unspecified: Secondary | ICD-10-CM | POA: Diagnosis not present

## 2022-05-02 LAB — BASIC METABOLIC PANEL
Anion gap: 7 (ref 5–15)
BUN: 40 mg/dL — ABNORMAL HIGH (ref 6–20)
CO2: 27 mmol/L (ref 22–32)
Calcium: 9.1 mg/dL (ref 8.9–10.3)
Chloride: 97 mmol/L — ABNORMAL LOW (ref 98–111)
Creatinine, Ser: 1.71 mg/dL — ABNORMAL HIGH (ref 0.44–1.00)
GFR, Estimated: 36 mL/min — ABNORMAL LOW (ref >60)
Glucose, Bld: 292 mg/dL — ABNORMAL HIGH (ref 70–99)
Potassium: 4.3 mmol/L (ref 3.5–5.1)
Sodium: 131 mmol/L — ABNORMAL LOW (ref 135–145)

## 2022-05-02 LAB — GLUCOSE, CAPILLARY
Glucose-Capillary: 341 mg/dL — ABNORMAL HIGH (ref 70–99)
Glucose-Capillary: 286 mg/dL — ABNORMAL HIGH (ref 70–99)
Glucose-Capillary: 273 mg/dL — ABNORMAL HIGH (ref 70–99)
Glucose-Capillary: 323 mg/dL — ABNORMAL HIGH (ref 70–99)

## 2022-05-02 MED ORDER — LORAZEPAM 0.5 MG PO TABS
0.5000 mg | ORAL_TABLET | Freq: Every day | ORAL | Status: DC
  Administered 2022-05-02: 0.5 mg via ORAL
  Filled 2022-05-02: qty 1

## 2022-05-02 MED ORDER — INSULIN GLARGINE-YFGN 100 UNIT/ML ~~LOC~~ SOLN
35.0000 [IU] | Freq: Every day | SUBCUTANEOUS | Status: DC
  Administered 2022-05-02: 35 [IU] via SUBCUTANEOUS
  Filled 2022-05-02 (×2): qty 0.35

## 2022-05-02 NOTE — Evaluation (Signed)
Physical Therapy Evaluation Patient Details Name: Lori [name] MRN: [id number] DOB: 1971/02/03 Today's Date: 05/02/2022  History of Present Illness  Pt is a 52 y/o female admitted 04/30/22 for AKI, hyperkalemia, hyponatremia and AMS in setting of accidental polypharmacy. PMH: CVA, aphasia, DM, gastroparesis, HLD, depression, anxiety, hypothyroidism, GERD, HTN, CAD  Clinical Impression  Patient seen in conjunction with OT at pt request and presents with decreased mobility due to generalized weakness, decreased balance and residual L spastic hemiplegia.  She was able to mobilize today with min to mod A up to recliner with RW.  States she feels balance is better than a couple days ago at home.  States was undergoing HH therapy after recent Botox injection and feels her hand is opening better.  She has spouse and aide to assist, but sadly stays in bed much of the day due to spouse at work.  Wonder if she could get set up for PACE.  PT will continue to follow in acute setting.  Feel follow up HHPT indicated at d/c.      Recommendations for follow up therapy are one component of a multi-disciplinary discharge planning process, led by the attending physician.  Recommendations may be updated based on patient status, additional functional criteria and insurance authorization.  Follow Up Recommendations       Assistance Recommended at Discharge Intermittent Supervision/Assistance  Patient can return home with the following  A little help with walking and/or transfers;Assistance with cooking/housework;Assist for transportation;Help with stairs or ramp for entrance;Two people to help with bathing/dressing/bathroom;Assistance with feeding    Equipment Recommendations None recommended by PT  Recommendations for Other Services       Functional Status Assessment       Precautions / Restrictions Precautions Precautions: Fall Precaution Comments: L hemiparesis Required Braces or Orthoses: Other  Brace Other Brace: ordered L resting hand splint 3/28 Restrictions Weight Bearing Restrictions: No      Mobility  Bed Mobility Overal bed mobility: Needs Assistance Bed Mobility: Supine to Sit     Supine to sit: Mod assist, HOB elevated     General bed mobility comments: able to assist with BLE to EOB, cues for holding bedrail and assist to lift trunk    Transfers   Equipment used: Rolling walker (2 wheels), 1 person hand held assist Transfers: Sit to/from Stand, Bed to chair/wheelchair/BSC Sit to Stand: Min assist, +2 safety/equipment   Step pivot transfers: Min assist, +2 safety/equipment, +2 physical assistance       General transfer comment: Able to stand without AD partially for LB ADL assist, Improved posture with RW (though reports having modified RW to accomodate L hand at home). Able to pivot to chair with mild knee buckling, cues for step sequence and hands on assist for balance    Ambulation/Gait                  Stairs            Wheelchair Mobility    Modified Rankin (Stroke Patients Only)       Balance Overall balance assessment: Needs assistance Sitting-balance support: No upper extremity supported, Feet supported Sitting balance-Leahy Scale: Fair Sitting balance - Comments: able to sit statically but when reaching up to brush hair noted R lateral LOB   Standing balance support: Single extremity supported, Bilateral upper extremity supported, During functional activity Standing balance-Leahy Scale: Poor Standing balance comment: UE support versus assist for balance  Pertinent Vitals/Pain Pain Assessment Faces Pain Scale: Hurts a little bit Pain Location: L LE with ROM Pain Descriptors / Indicators: Grimacing, Guarding Pain Intervention(s): Monitored during session, Repositioned, Limited activity within patient's tolerance    Home Living Family/patient expects to be discharged to:: Private  residence Living Arrangements: Spouse/significant other Available Help at Discharge: Family;Available PRN/intermittently;Personal care attendant Type of Home: Apartment Home Access: Stairs to enter   Entrance Stairs-Number of Steps: 5   Home Layout: One level Home Equipment: Rollator (4 wheels);Hand held shower head;Hospital bed;Shower seat;BSC/3in1;Wheelchair - manual;Other (comment) (purewick system) Additional Comments: Aide daily for 3 hours/day. pt's husband works during the day. reports being evaluated for power wheelchair and CAPS program    Prior Function Prior Level of Function : Needs assist             Mobility Comments: per pt, she typically uses wheelchair for mobility, light assist for transfers. reports walking some with therapy ADLs Comments: reports aide assists with "everything" in regards to ADLs. Pt reports aide assists her back to bed after ADL assist where she remains all day while husband at work. has [place] system at home that she uses in bed. reports working with [facility] therapies     Hand Dominance   Dominant Hand: Right    Extremity/Trunk Assessment   Upper Extremity Assessment Upper Extremity Assessment: Defer to OT evaluation LUE Deficits / Details: some muscle activation at all joints. reports receiving botox injection in L UE and able to open hand more. Noted contractures of L MCPs. reports not having resting hand splint at home and open for receiving one. Contacted MD for verbal orders to place brace order LUE Coordination: decreased fine motor;decreased gross motor    Lower Extremity Assessment Lower Extremity Assessment: LLE deficits/detail LLE Deficits / Details: AAROM limited with mild pain and increased time throughout hip and knee and ankle, though functional strength able to step with L LE to recliner    Cervical / Trunk Assessment Cervical / Trunk Assessment: Normal  Communication   Communication: Expressive difficulties  Cognition  Arousal/Alertness: Awake/alert Behavior During Therapy: WFL for tasks assessed/performed Overall Cognitive Status: History of cognitive impairments - at baseline                                 General Comments: impaired cognition since CVA, slower processing, memory deficits. follows directions with increased time. noted to ask for assist with tasks that pt likely able to do without assist (combing hair, ordering lunch, finding call bell to turn on tv)        General Comments General comments (skin integrity, edema, etc.): spouse entered during session and brought briefs, shoes, wipes and toothbrush/paste    Exercises     Assessment/Plan    PT Assessment    PT Problem List         PT Treatment Interventions      PT Goals (Current goals can be found in the Care Plan section)       Frequency Min 3X/week     Co-evaluation PT/OT/SLP Co-Evaluation/Treatment: Yes Reason for Co-Treatment: For patient/therapist safety;To address functional/ADL transfers PT goals addressed during session: Mobility/safety with mobility;Proper use of DME;Balance OT goals addressed during session: ADL's and self-care;Proper use of Adaptive equipment and DME       AM-PAC PT "6 Clicks" Mobility  Outcome Measure Help needed turning from your back to your side while in a flat bed  without using bedrails?: A Lot Help needed moving from lying on your back to sitting on the side of a flat bed without using bedrails?: A Lot Help needed moving to and from a bed to a chair (including a wheelchair)?: Total Help needed standing up from a chair using your arms (e.g., wheelchair or bedside chair)?: Total Help needed to walk in hospital room?: Total Help needed climbing 3-5 steps with a railing? : Total 6 Click Score: 8    End of Session Equipment Utilized During Treatment: Gait belt Activity Tolerance: Patient tolerated treatment well Patient left: in chair;with chair alarm set;with call  bell/phone within reach;with family/visitor present   PT Visit Diagnosis: Muscle weakness (generalized) (M62.81)    Time: 1140-1210 PT Time Calculation (min) (ACUTE ONLY): 30 min   Charges:   PT Evaluation $PT Eval Moderate Complexity: 1 Mod          Cyndi Lizbeth Feijoo, PT Acute Rehabilitation Services [phone] 05/02/2022   Reginia Naas 05/02/2022, 2:25 PM

## 2022-05-02 NOTE — Progress Notes (Deleted)
Physical Therapy Treatment Patient Details Name: Lori [name] MRN: [id number] DOB: 1970/04/11 Today's Date: 05/02/2022   History of Present Illness Pt is a 52 y/o female admitted for AKI, hyperkalemia, hyponatremia and AMS in setting of accidental polypharmacy. PMH: CVA, aphasia, DM, gastroparesis, HLD, depression, anxiety, hypothyroidism, GERD, HTN, CAD    PT Comments    Patient seen in conjunction with OT at pt request.  She was able to mobilize with min to mod A up to recliner with RW.  States she feels balance is much better than a couple days ago at home.  States she was undergoing HH therapy after a Botox injection at [facility] recently and feels her hand is opening better.  She has spouse and aide to assist, but sadly stays in bed much of the day due to spouse at work.  Wonder if could get set up for [facility]ACE.  PT will follow in acute setting.  Feel follow up HHPT indicated at d/c.    Recommendations for follow up therapy are one component of a multi-disciplinary discharge planning process, led by the attending physician.  Recommendations may be updated based on patient status, additional functional criteria and insurance authorization.  Follow Up Recommendations       Assistance Recommended at Discharge Intermittent Supervision/Assistance  Patient can return home with the following A little help with walking and/or transfers;Assistance with cooking/housework;Assist for transportation;Help with stairs or ramp for entrance;Two people to help with bathing/dressing/bathroom;Assistance with feeding   Equipment Recommendations  None recommended by PT    Recommendations for Other Services       Precautions / Restrictions Precautions Precautions: Fall Precaution Comments: L hemiparesis Required Braces or Orthoses: Other Brace Other Brace: ordered L resting hand splint 3/28 Restrictions Weight Bearing Restrictions: No     Mobility  Bed Mobility Overal bed mobility: Needs  Assistance Bed Mobility: Supine to Sit     Supine to sit: Mod assist, HOB elevated     General bed mobility comments: able to assist with BLE to EOB, cues for holding bedrail and assist to lift trunk    Transfers   Equipment used: Rolling walker (2 wheels), 1 person hand held assist Transfers: Sit to/from Stand, Bed to chair/wheelchair/BSC Sit to Stand: Min assist, +2 safety/equipment   Step pivot transfers: Min assist, +2 safety/equipment, +2 physical assistance       General transfer comment: Able to stand without AD partially for LB ADL assist, Improved posture with RW (though reports having modified RW to accomodate L hand at home). Able to pivot to chair with mild knee buckling, cues for step sequence and hands on assist for balance    Ambulation/Gait                   Stairs             Wheelchair Mobility    Modified Rankin (Stroke Patients Only)       Balance Overall balance assessment: Needs assistance Sitting-balance support: No upper extremity supported, Feet supported Sitting balance-Leahy Scale: Fair Sitting balance - Comments: able to sit statically but when reaching up to brush hair noted R lateral LOB   Standing balance support: Single extremity supported, Bilateral upper extremity supported, During functional activity Standing balance-Leahy Scale: Poor Standing balance comment: UE support versus assist for balance                            Cognition Arousal/Alertness: Awake/alert Behavior  During Therapy: WFL for tasks assessed/performed Overall Cognitive Status: History of cognitive impairments - at baseline                                 General Comments: impaired cognition since CVA, slower processing, memory deficits. follows directions with increased time. noted to ask for assist with tasks that pt likely able to do without assist (combing hair, ordering lunch, finding call bell to turn on tv)         Exercises      General Comments General comments (skin integrity, edema, etc.): spouse entered during session and brought briefs, shoes, wipes and toothbrush/paste      Pertinent Vitals/Pain Pain Assessment Faces Pain Scale: Hurts a little bit Pain Location: L LE with ROM Pain Descriptors / Indicators: Grimacing, Guarding Pain Intervention(s): Monitored during session, Repositioned, Limited activity within patient's tolerance    Home Living Family/patient expects to be discharged to:: Private residence Living Arrangements: Spouse/significant other Available Help at Discharge: Family;Available PRN/intermittently;Personal care attendant Type of Home: Apartment Home Access: Stairs to enter   Entrance Stairs-Number of Steps: 5   Home Layout: One level Home Equipment: Rollator (4 wheels);Hand held shower head;Hospital bed;Shower seat;BSC/3in1;Wheelchair - manual;Other (comment) (purewick system) Additional Comments: Aide daily for 3 hours/day. pt's husband works during the day. reports being evaluated for power wheelchair and CAPS program    Prior Function            PT Goals (current goals can now be found in the care plan section) Progress towards PT goals: Progressing toward goals    Frequency    Min 3X/week      PT Plan Current plan remains appropriate    Co-evaluation PT/OT/SLP Co-Evaluation/Treatment: Yes Reason for Co-Treatment: For patient/therapist safety;To address functional/ADL transfers PT goals addressed during session: Mobility/safety with mobility;Proper use of DME;Balance OT goals addressed during session: ADL's and self-care;Proper use of Adaptive equipment and DME      AM-PAC PT "6 Clicks" Mobility   Outcome Measure  Help needed turning from your back to your side while in a flat bed without using bedrails?: A Lot Help needed moving from lying on your back to sitting on the side of a flat bed without using bedrails?: A Lot Help needed moving to  and from a bed to a chair (including a wheelchair)?: Total Help needed standing up from a chair using your arms (e.g., wheelchair or bedside chair)?: Total Help needed to walk in hospital room?: Total Help needed climbing 3-5 steps with a railing? : Total 6 Click Score: 8    End of Session Equipment Utilized During Treatment: Gait belt Activity Tolerance: Patient tolerated treatment well Patient left: in chair;with chair alarm set;with call bell/phone within reach;with family/visitor present   PT Visit Diagnosis: Muscle weakness (generalized) (M62.81)     Time: 1140-1210 PT Time Calculation (min) (ACUTE ONLY): 30 min  Charges:                        Lori [name], PT Acute Rehabilitation Services Office:[phone] 05/02/2022    Lori [name] 05/02/2022, 2:09 PM

## 2022-05-02 NOTE — Progress Notes (Signed)
Orthopedic Tech Progress Note Patient Details:  [name] 1970-08-22 [id number]  Order for resting L hand splint called into [facility].  Patient ID: Lori [name], female   DOB: 1971-01-26, 52 y.o.   MRN: [id number]  Carin Primrose 05/02/2022, 7:24 PM

## 2022-05-02 NOTE — Evaluation (Signed)
Occupational Therapy Evaluation Patient Details Name: Lori [name] MRN: [id number] DOB: 03-23-1970 Today's Date: 05/02/2022   History of Present Illness Pt is a 52 y/o female admitted for AKI, hyperkalemia, hyponatremia and AMS in setting of accidental polypharmacy. PMH: CVA, aphasia, DM, gastroparesis, HLD, depression, anxiety, hypothyroidism, GERD, HTN, CAD   Clinical Impression   PTA, pt lives with spouse who works during the day, primarily uses wheelchair for mobility and has assist for transfers/ADLs from PCA daily for 3 hours. Pt presents now fairly close to baseline with minor new weakness w/ prolonged time in bed. Overall, pt requires Mod A for bed mobility, Min A x 2 for safety in transfers and at baseline for ADLs (extensive assist). Pt reports working with [facility] therapy (focusing on L UE s/p recent botox injection) and hopeful to continue working with therapy at [facility]. Placed order for L resting hand splint to address contractures after discussion w/ attending. Will follow acutely to address L resting hand splint, functional transfers and dynamic sitting balance.       Recommendations for follow up therapy are one component of a multi-disciplinary discharge planning process, led by the attending physician.  Recommendations may be updated based on patient status, additional functional criteria and insurance authorization.   Assistance Recommended at Discharge Intermittent Supervision/Assistance  Patient can return home with the following A little help with walking and/or transfers;A lot of help with bathing/dressing/bathroom;Direct supervision/assist for medications management;Direct supervision/assist for financial management    Functional Status Assessment  Patient has had a recent decline in their functional status and demonstrates the ability to make significant improvements in function in a reasonable and predictable amount of time.  Equipment Recommendations  None recommended by  OT    Recommendations for Other Services       Precautions / Restrictions Precautions Precautions: Fall;Other (comment) Precaution Comments: L hemiparesis Required Braces or Orthoses: Other Brace Other Brace: ordered L resting hand splint 3/28 Restrictions Weight Bearing Restrictions: No      Mobility Bed Mobility Overal bed mobility: Needs Assistance Bed Mobility: Supine to Sit     Supine to sit: Mod assist, HOB elevated     General bed mobility comments: able to assist with BLE to EOB, cues for holding bedrail and assist to lift trunk    Transfers Overall transfer level: Needs assistance Equipment used: Rolling walker (2 wheels), 1 person hand held assist Transfers: Sit to/from Stand, Bed to chair/wheelchair/BSC Sit to Stand: Min assist     Step pivot transfers: Min assist, +2 safety/equipment, +2 physical assistance     General transfer comment: Able to stand without AD partially for LB ADL assist, Improved posture with RW (though reports having modified RW to accomodate L hand at home). Able to pivot to chair with mild knee buckling, cues for step sequence and hands on assist for balance      Balance Overall balance assessment: Needs assistance Sitting-balance support: No upper extremity supported, Feet supported Sitting balance-Leahy Scale: Fair Sitting balance - Comments: able to sit statically but when reaching up to brush hair noted R lateral LOB Postural control: Right lateral lean Standing balance support: Single extremity supported, Bilateral upper extremity supported, During functional activity Standing balance-Leahy Scale: Fair                             ADL either performed or assessed with clinical judgement   ADL Overall ADL's : Needs assistance/impaired;At baseline  Lower Body Dressing: Total assistance;+2 for physical assistance;+2 for safety/equipment;Sit to/from stand Lower Body Dressing Details  (indicate cue type and reason): assist for socks and wrap around brief Toilet Transfer: Minimal assistance;+2 for safety/equipment;Stand-pivot Toilet Transfer Details (indicate cue type and reason): simulated to chair Toileting- Clothing Manipulation and Hygiene: Total assistance Toileting - Clothing Manipulation Details (indicate cue type and reason): received on bedpan, Total A for cleanup       General ADL Comments: appears at baseline for LB ADLs and minor deficits in functional transfer abilities though fairly close to this baseline as well     Vision Baseline Vision/History: 1 Wears glasses Ability to See in Adequate Light: 1 Impaired Patient Visual Report: No change from baseline Vision Assessment?: No apparent visual deficits     Perception     Praxis      Pertinent Vitals/Pain Pain Assessment Pain Assessment: Faces Faces Pain Scale: Hurts a little bit Pain Location: L MCPs with ROM Pain Descriptors / Indicators: Grimacing, Guarding Pain Intervention(s): Limited activity within patient's tolerance     Hand Dominance Right   Extremity/Trunk Assessment Upper Extremity Assessment Upper Extremity Assessment: LUE deficits/detail LUE Deficits / Details: some muscle activation at all joints. reports receiving botox injection in L UE and able to open hand more. Noted contractures of L MCPs. reports not having resting hand splint at home and open for receiving one. Contacted [facility] for verbal orders to place brace order LUE Coordination: decreased fine motor;decreased gross motor   Lower Extremity Assessment Lower Extremity Assessment: Defer to PT evaluation   Cervical / Trunk Assessment Cervical / Trunk Assessment: Normal   Communication Communication Communication: Expressive difficulties   Cognition Arousal/Alertness: Awake/alert Behavior During Therapy: WFL for tasks assessed/performed, Flat affect Overall Cognitive Status: History of cognitive impairments - at  baseline                                 General Comments: impaired cognition since CVA, slower processing, memory deficits. follows directions with increased time. noted to ask for assist with tasks that pt likely able to do without assist (combing hair, ordering lunch, finding call bell to turn on tv)     General Comments  Husband entering during session    Exercises     Shoulder Instructions      Home Living Family/patient expects to be discharged to:: Private residence Living Arrangements: Spouse/significant other Available Help at Discharge: Family;Available PRN/intermittently;Personal care attendant Type of Home: Apartment Home Access: Stairs to enter Entrance Stairs-Number of Steps: 5   Home Layout: One level     Bathroom Shower/Tub: Occupational psychologist: Standard     Home Equipment: Rollator (4 wheels);Hand held shower head;Hospital bed;Shower seat;BSC/3in1;Wheelchair - manual;Other (comment) (purewick system)   Additional Comments: Aide daily for 3 hours/day. pt's husband works during the day. reports being evaluated for power wheelchair and CAPS program      Prior Functioning/Environment Prior Level of Function : Needs assist             Mobility Comments: per pt, she typically uses wheelchair for mobility, light assist for transfers. reports walking some with therapy ADLs Comments: reports aide assists with "everything" in regards to ADLs. Pt reports aide assists her back to bed after ADL assist where she remains all day while husband at work. has [place] system at home that she uses in bed. reports working with [facility] therapies  OT Problem List: Decreased strength;Decreased range of motion;Decreased activity tolerance;Impaired balance (sitting and/or standing);Impaired UE functional use;Decreased cognition      OT Treatment/Interventions: Self-care/ADL training;Therapeutic exercise;DME and/or AE instruction;Energy  conservation;Therapeutic activities;Balance training;Patient/family education    OT Goals(Current goals can be found in the care plan section) Acute Rehab OT Goals Patient Stated Goal: get up to chair OT Goal Formulation: With patient/family Time For Goal Achievement: 05/16/22 Potential to Achieve Goals: Good  OT Frequency: Min 2X/week    Co-evaluation PT/OT/SLP Co-Evaluation/Treatment: Yes Reason for Co-Treatment: For patient/therapist safety;To address functional/ADL transfers   OT goals addressed during session: ADL's and self-care;Proper use of Adaptive equipment and DME      AM-PAC OT "6 Clicks" Daily Activity     Outcome Measure Help from another person eating meals?: A Little Help from another person taking care of personal grooming?: A Little Help from another person toileting, which includes using toliet, bedpan, or urinal?: Total Help from another person bathing (including washing, rinsing, drying)?: A Lot Help from another person to put on and taking off regular upper body clothing?: A Lot Help from another person to put on and taking off regular lower body clothing?: Total 6 Click Score: 12   End of Session Equipment Utilized During Treatment: Gait belt;Rolling walker (2 wheels) Nurse Communication: Mobility status  Activity Tolerance: Patient tolerated treatment well Patient left: in chair;with call bell/phone within reach;with chair alarm set;with family/visitor present  OT Visit Diagnosis: Other abnormalities of gait and mobility (R26.89);Muscle weakness (generalized) (M62.81)                Time: 1128-1209 OT Time Calculation (min): 41 min Charges:  OT General Charges $OT Visit: 1 Visit OT Evaluation $OT Eval Moderate Complexity: 1 Mod OT Treatments $Self Care/Home Management : 8-22 mins  Lori [name], OTR/L Acute Rehab Services Office: [phone]   Layla Maw 05/02/2022, 1:06 PM

## 2022-05-02 NOTE — TOC CM/SW Note (Signed)
Patient active with [facility] for RN,PT,OT,Sp. Will need resumption of care orders signed.

## 2022-05-02 NOTE — Progress Notes (Signed)
PROGRESS NOTE    Lori [name]  [id number] DOB: [birth year] DOA: 04/30/2022 PCP: [name], NP   Chief Complaint  Patient presents with   Weakness    Brief Narrative:    Lori [name] is a 52 y.o. female with medical history significant of stroke with spastic hemiplegia and aphasia, wheelchair-bound, diabetes, gastroparesis, hyperlipidemia, depression, anxiety, hypothyroidism, GERD, hypertension, CAD, pheochromocytoma presenting with weakness and lethargy.   Is obtained with assistance of husband and chart review.  Patient has history of prior strokes and spastic hemiplegia on the left as above.  Has been stated she can walk at times but is largely bed and wheelchair-bound.  Usually awake and conversant.  Has had increased fatigue for the last day or so with significant decline in her function and ability to be conversant.  She has been noted to be falling asleep midsentence at times which is very unusual for her.   Reports feeling hungry and that she did not sleep too much at night before.  Denies fevers, chills, chest pain, shortness of breath, [place] pain, constipation, diarrhea, nausea, vomiting.   ED Course: Vital signs in the ED stable.  Lab workup included CMP with sodium 128, potassium 6.9, chloride 95, bicarb 19, BUN 66, creatinine elevated 2.79 from baseline of 0.7, glucose 313, calcium 9.3, protein 7.5, albumin 3.8.  CBC notable for platelets of 92.  Troponin normal with repeat pending.  BNP pending.  Respiratory panel for flu COVID and RSV pending.  Urinalysis pending.  TSH normal.  VBG with normal pH and normal pCO2.  EKG noted to have sinus rhythm at 76 beats per minutes without any peaked T waves and QRS of 86.  Patient received Lokelma, insulin, 2 L of IV fluids, calcium gluconate, albuterol in the ED.  Assessment & Plan:   Principal Problem:   AKI (acute kidney injury) ([place]) Active Problems:   Type 2 diabetes mellitus with hyperlipidemia (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 - resumed on Lexapro-mentation back to baseline, she was requesting to go back on her Ativan which I will resume today  History of CVA Left-sided spastic hemiplegia Aphasia - As per HPI history of previous stroke with residual deficits on the left and aphasia.  Can get up and walk with assistance at times but is largely bed and wheelchair bound.  Altered mental status today as above, likely polypharmacy combined with AKI/dehydration. - Continue home Plavix, atorvastatin, Zetia - Up with assistance   Diabetes -CBGs elevated, so we will increase her insulin from 28 to 37 units which is her home dose. 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   Depression Anxiety -Resuming Lexapro and Ativan  DVT prophylaxis: Heparin Code Status: Full Family Communication: none at bedside Disposition: Home with home health, she has home health PT/OT/speech/aide and RN  Status is: Inpatient    Consultants:  None    Subjective:  No significant events overnight, she denies any complaints Today, asking to get back  on her home dose Ativan  Objective: Vitals:   05/01/22 2307 05/02/22 0320 05/02/22 0825 05/02/22 1235  BP: 97/62 97/66 100/73 126/88  Pulse: 71 79 76 86  Resp: 18 14 19 15   Temp: 98 F (36.7 C) 97.9 F (36.6 C) 97.8 F (36.6 C) 97.9 F (36.6 C)  TempSrc: Oral Oral Oral Oral  SpO2: 96% 96% 97% 95%  Weight:        Intake/Output Summary (Last 24 hours) at 05/02/2022 1428 Last data filed at 05/02/2022 1236 Gross per 24 hour  Intake 600 ml  Output 500 ml  Net 100 ml   Filed Weights   05/01/22 0545  Weight: 97.2 kg    Examination:  Awake Alert, Oriented X 3, slow speech, chronic left side weakness, contracted Symmetrical Chest wall movement, Good air movement bilaterally, CTAB RRR,No Gallops,Rubs or new Murmurs, No Parasternal Heave +ve B.Sounds, Abd Soft, No tenderness, No rebound - guarding or rigidity. No Cyanosis, Clubbing or edema, No new Rash or bruise       Data Reviewed: I have personally reviewed following labs and imaging studies  CBC: Recent Labs  Lab 04/30/22 1245 04/30/22 1508 04/30/22 1525 05/01/22 0200  WBC 6.7  --   --  8.9  NEUTROABS 4.4  --   --   --   HGB 13.6 14.3 14.3 12.0  HCT 40.7 42.0 42.0 34.9*  MCV 77.5*  --   --  75.2*  PLT 92*  --   --  0000000    Basic Metabolic Panel: Recent Labs  Lab 04/30/22 1919 05/01/22 0200 05/01/22 0837 05/01/22 1257 05/02/22 0101  NA 132* 131* 132* 129* 131*  K 6.5* 4.3 4.3 4.7 4.3  CL 99 97* 99 96* 97*  CO2 18* 20* 21* 21* 27  GLUCOSE 177* 165* 137* 231* 292*  BUN 61* 52* 45* 43* 40*  CREATININE 2.15* 1.81* 1.62* 1.55* 1.71*  CALCIUM 9.5 9.1 9.4 8.9 9.1    GFR: Estimated Creatinine Clearance: 43.3 mL/min (A) (by C-G formula based on SCr of 1.71 mg/dL (H)).  Liver Function Tests: Recent Labs  Lab 04/30/22 1128 05/01/22 0200  AST 13* 11*  ALT 17 13  ALKPHOS 110 89  BILITOT 0.7 0.8  PROT 7.5 6.6  ALBUMIN 3.8 3.2*    CBG: Recent Labs  Lab 05/01/22 1230 05/01/22 1702 05/01/22 2129  05/02/22 0635 05/02/22 1229  GLUCAP 230* 236* 291* 341* 286*     Recent Results (from the past 240 hour(s))  Resp panel by RT-PCR (RSV, Flu A&B, Covid) Anterior Nasal Swab     Status: None   Collection Time: 04/30/22 11:27 AM   Specimen: Anterior Nasal Swab  Result Value Ref Range Status   SARS Coronavirus 2 by RT PCR NEGATIVE NEGATIVE Final   Influenza A by PCR NEGATIVE NEGATIVE Final   Influenza B by PCR NEGATIVE NEGATIVE Final    Comment: (NOTE) The Xpert Xpress SARS-CoV-2/FLU/RSV plus assay is intended as an aid in the diagnosis of influenza from Nasopharyngeal swab specimens and should not be used as a sole basis for treatment. Nasal washings and aspirates are unacceptable for Xpert Xpress SARS-CoV-2/FLU/RSV testing.  Fact Sheet for Patients: EntrepreneurPulse.com.au  Fact  Sheet for Healthcare Providers: IncredibleEmployment.be  This test is not yet approved or cleared by the [place] and has been authorized for detection and/or diagnosis of SARS-CoV-2 by FDA under an Emergency Use Authorization (EUA). This EUA will remain in effect (meaning this test can be used) for the duration of the COVID-19 declaration under Section 564(b)(1) of the Act, 21 U.S.C. section 360bbb-3(b)(1), unless the authorization is terminated or revoked.     Resp Syncytial Virus by PCR NEGATIVE NEGATIVE Final    Comment: (NOTE) Fact Sheet for Patients: EntrepreneurPulse.com.au  Fact Sheet for Healthcare Providers: IncredibleEmployment.be  This test is not yet approved or cleared by the [place] FDA and has been authorized for detection and/or diagnosis of SARS-CoV-2 by FDA under an Emergency Use Authorization (EUA). This EUA will remain in effect (meaning this test can be used) for the duration of the COVID-19 declaration under Section 564(b)(1) of the Act, 21 U.S.C. section 360bbb-3(b)(1), unless the  authorization is terminated or revoked.  Performed at [facility], [place] [address], [place], [place] [address]          Radiology Studies: US RENAL  Result Date: 04/30/2022 CLINICAL DATA:  Acute kidney injury. EXAM: RENAL / URINARY TRACT ULTRASOUND COMPLETE COMPARISON:  CT AP 07/10/2021 FINDINGS: Right Kidney: Renal measurements: 10.0 x 4.4 x 3.7 cm = volume: 85.5 mL. Echogenicity within normal limits. No mass or hydronephrosis visualized. Left Kidney: Renal measurements: 9.8 x 5.3 x 5.4 cm = volume: 145.8 mL. Echogenicity within normal limits. No mass or hydronephrosis visualized. Bladder: Appears normal for degree of bladder distention. Other: Exam detail is diminished secondary to patient body habitus. IMPRESSION: No acute abnormality.  No signs of obstructive uropathy. Electronically Signed   By: Kerby Moors M.D.   On: 04/30/2022 15:43        Scheduled Meds:  amLODipine  5 mg Oral Daily   atorvastatin  80 mg Oral Daily   clopidogrel  75 mg Oral Daily   escitalopram  10 mg Oral QHS   ezetimibe  10 mg Oral Daily   gabapentin  100 mg Oral TID   heparin injection (subcutaneous)  5,000 Units Subcutaneous Q8H   insulin aspart  0-5 Units Subcutaneous QHS   insulin aspart  0-9 Units Subcutaneous TID WC   insulin glargine-yfgn  35 Units Subcutaneous Daily   metoprolol tartrate  100 mg Oral BID   nystatin cream  1 Application Topical BID   pantoprazole  40 mg Oral q morning   sodium chloride flush  3 mL Intravenous Q12H   tamsulosin  0.4 mg Oral QPC supper   Continuous Infusions:   LOS: 1 day        Phillips Climes, MD [facility]   To contact the attending provider between 7A-7P or the covering provider during after hours 7P-7A, please log into the web site [email or web address] and access using universal [place] password for that web site. If you do not have the password, please call the hospital operator.  05/02/2022, 2:28 PM

## 2022-05-03 ENCOUNTER — Encounter: Payer: Self-pay | Admitting: *Deleted

## 2022-05-03 ENCOUNTER — Other Ambulatory Visit: Payer: Self-pay | Admitting: Family

## 2022-05-03 DIAGNOSIS — E785 Hyperlipidemia, unspecified: Secondary | ICD-10-CM | POA: Diagnosis not present

## 2022-05-03 DIAGNOSIS — I10 Essential (primary) hypertension: Secondary | ICD-10-CM | POA: Diagnosis not present

## 2022-05-03 DIAGNOSIS — K219 Gastro-esophageal reflux disease without esophagitis: Secondary | ICD-10-CM

## 2022-05-03 DIAGNOSIS — N179 Acute kidney failure, unspecified: Secondary | ICD-10-CM | POA: Diagnosis not present

## 2022-05-03 DIAGNOSIS — E1169 Type 2 diabetes mellitus with other specified complication: Secondary | ICD-10-CM | POA: Diagnosis not present

## 2022-05-03 LAB — CBC
HCT: 36.1 % (ref 36.0–46.0)
Hemoglobin: 12.2 g/dL (ref 12.0–15.0)
MCH: 25.7 pg — ABNORMAL LOW (ref 26.0–34.0)
MCHC: 33.8 g/dL (ref 30.0–36.0)
MCV: 76 fL — ABNORMAL LOW (ref 80.0–100.0)
Platelets: 355 10*3/uL (ref 150–400)
RBC: 4.75 MIL/uL (ref 3.87–5.11)
RDW: 14.6 % (ref 11.5–15.5)
WBC: 7.3 10*3/uL (ref 4.0–10.5)
nRBC: 0 % (ref 0.0–0.2)

## 2022-05-03 LAB — GLUCOSE, CAPILLARY
Glucose-Capillary: 271 mg/dL — ABNORMAL HIGH (ref 70–99)
Glucose-Capillary: 282 mg/dL — ABNORMAL HIGH (ref 70–99)
Glucose-Capillary: 326 mg/dL — ABNORMAL HIGH (ref 70–99)

## 2022-05-03 LAB — BASIC METABOLIC PANEL
Anion gap: 11 (ref 5–15)
BUN: 31 mg/dL — ABNORMAL HIGH (ref 6–20)
CO2: 22 mmol/L (ref 22–32)
Calcium: 9 mg/dL (ref 8.9–10.3)
Chloride: 97 mmol/L — ABNORMAL LOW (ref 98–111)
Creatinine, Ser: 1.26 mg/dL — ABNORMAL HIGH (ref 0.44–1.00)
GFR, Estimated: 52 mL/min — ABNORMAL LOW (ref >60)
Glucose, Bld: 335 mg/dL — ABNORMAL HIGH (ref 70–99)
Potassium: 4.3 mmol/L (ref 3.5–5.1)
Sodium: 130 mmol/L — ABNORMAL LOW (ref 135–145)

## 2022-05-03 MED ORDER — ORAL CARE MOUTH RINSE: 15.0000 mL | OROMUCOSAL | Status: DC | PRN

## 2022-05-03 MED ORDER — INSULIN GLARGINE-YFGN 100 UNIT/ML ~~LOC~~ SOLN
38.0000 [IU] | Freq: Every day | SUBCUTANEOUS | Status: DC
  Administered 2022-05-03: 38 [IU] via SUBCUTANEOUS
  Filled 2022-05-03: qty 0.38

## 2022-05-03 MED ORDER — INSULIN ASPART 100 UNIT/ML IJ SOLN
4.0000 [IU] | Freq: Three times a day (TID) | INTRAMUSCULAR | Status: DC
  Administered 2022-05-03 (×2): 4 [IU] via SUBCUTANEOUS

## 2022-05-03 MED ORDER — ALUM & MAG HYDROXIDE-SIMETH 200-200-20 MG/5ML PO SUSP
15.0000 mL | Freq: Four times a day (QID) | ORAL | Status: DC | PRN
  Administered 2022-05-03: 15 mL via ORAL
  Filled 2022-05-03: qty 30

## 2022-05-03 NOTE — TOC Initial Note (Addendum)
Transition of Care (TOC) - Initial/Assessment Note   Prior to admission patient active with [facility] for HHRN,PT,OT, ST. Received call from [facility] with [facility] patient's insurance changes May 06, 2022 to [facility], therefore they cannot accept back. NCM explained same to patient. Patient would like [facility]. Referral given to and accepted by Claiborne Billings with [facility]   Nurse and patient ready for discharge. PTAR called paperwork in chart. NCM confirmed address.   Called spouse no answer and mailbox full . Nurse has spoke to husband , he is aware patient coming home today by ambulance  Patient Details  Name: Lori [name] MRN: [id number] Date of Birth: 02/07/1970  Transition of Care [facility]) CM/SW Contact:    Marilu Favre, RN Phone Number: 05/03/2022, 12:08 PM  Clinical Narrative:                   Expected Discharge Plan: [place] Barriers to Discharge: No Barriers Identified   Patient Goals and CMS Choice Patient states their goals for this hospitalization and ongoing recovery are:: to return home CMS Medicare.gov Compare Post Acute Care list provided to:: Patient Choice offered to / list presented to : Patient [place] ownership interest in [facility].provided to:: Patient    Expected Discharge Plan and Services   Discharge Planning Services: CM Consult Post Acute Care Choice: [place] arrangements for the past 2 months: Apartment Expected Discharge Date: 05/03/22               DME Arranged: N/A         HH Arranged: PT, OT, Speech Therapy, RN          Prior Living Arrangements/Services Living arrangements for the past 2 months: Apartment Lives with:: Spouse Patient language and need for interpreter reviewed:: Yes Do you feel safe going back to the place where you live?: Yes      Need for Family Participation in Patient Care: Yes (Comment) Care giver support system in place?: Yes (comment) Current home  services: DME Criminal Activity/Legal Involvement Pertinent to Current Situation/Hospitalization: No - Comment as needed  Activities of Daily Living   ADL Screening (condition at time of admission) Patient's cognitive ability adequate to safely complete daily activities?: Yes Is the patient deaf or have difficulty hearing?: No Does the patient have difficulty seeing, even when wearing glasses/contacts?: No Does the patient have difficulty concentrating, remembering, or making decisions?: Yes Patient able to express need for assistance with ADLs?: Yes Does the patient have difficulty dressing or bathing?: Yes Independently performs ADLs?: No Communication: Needs assistance Is this a change from baseline?: Pre-admission baseline Dressing (OT): Needs assistance Is this a change from baseline?: Pre-admission baseline Grooming: Needs assistance Is this a change from baseline?: Pre-admission baseline Feeding: Needs assistance Is this a change from baseline?: Pre-admission baseline Bathing: Needs assistance Is this a change from baseline?: Pre-admission baseline Toileting: Needs assistance Is this a change from baseline?: Pre-admission baseline In/Out Bed: Dependent Is this a change from baseline?: Pre-admission baseline Walks in Home: Dependent Is this a change from baseline?: Pre-admission baseline Does the patient have difficulty walking or climbing stairs?: Yes Weakness of Legs: Both Weakness of Arms/Hands: Left  Permission Sought/Granted   Permission granted to share information with : No              Emotional Assessment Appearance:: Appears stated age Attitude/Demeanor/Rapport: Engaged Affect (typically observed): Accepting Orientation: : Oriented to Self, Oriented to Place, Oriented to  Time, Oriented to Situation Alcohol / Substance Use: Not Applicable Psych Involvement: No (comment)  Admission diagnosis:  AKI (acute kidney injury) [N17.9] Patient Active Problem  List   Diagnosis Date Noted   Lethargy 05/01/2022   Hyponatremia 04/30/2022   Hyperkalemia 04/30/2022   AKI (acute kidney injury) ([place]) 04/30/2022   Wheelchair dependence 04/08/2022   Spasticity as late effect of cerebrovascular accident (CVA) 04/08/2022   Left hemiplegia ([place]) 04/08/2022   Dyspnea 11/02/2021   GERD (gastroesophageal reflux disease) 11/02/2021   Acute bronchitis 08/24/2021   Pseudohyponatremia 07/10/2021   Thyroid disease 07/10/2021   Functional urinary incontinence 05/17/2021   MDD (major depressive disorder), recurrent episode, severe ([place]) 04/09/2021   Diabetic gastroparesis ([place]) 03/16/2021   Migraines 03/16/2021   History of cerebral infarction    Debility 02/26/2021   DKA (diabetic ketoacidosis) ([place]) 02/19/2021   Weakness    Type 2 diabetes mellitus with hyperlipidemia ([place])    Aphasia    Spell of abnormal behavior    History of CVA (cerebrovascular accident) 01/19/2021   Tachycardia 01/19/2021   CAD (coronary artery disease) 01/19/2021   Spastic hemiparesis of left 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 ([place]) 01/19/2021   Diabetes ([place])    Hyperthyroidism    Essential hypertension    Pheochromocytoma    Anxiety and depression    HLD (hyperlipidemia)    PCP:  [name], NP Pharmacy:   [facility] [place], [place] [address] [place] [address] Phone: [phone] Fax: [place], [place] [address] Phone: [phone] Fax: [phone]  [address] [place], [place] [address] Phone: [phone] Fax: [phone]     Social Determinants of Health (SDOH) Social History: SDOH Screenings   Food Insecurity: No Food Insecurity (05/03/2022)  Housing: Low Risk   (05/03/2022)  Transportation Needs: No Transportation Needs (05/03/2022)  Utilities: Not At Risk (05/03/2022)  Depression (PHQ2-9): Low Risk  (04/08/2022)  Recent Concern: Depression (PHQ2-9) - Medium Risk (01/23/2022)  Tobacco Use: Low Risk  (04/30/2022)   SDOH Interventions:     Readmission Risk Interventions    08/29/2021    2:32 PM 07/18/2021   12:43 PM 07/11/2021    1:22 PM  Readmission Risk Prevention Plan  Transportation Screening Complete Complete Complete  Medication Review Press photographer) Complete Complete Complete  PCP or Specialist appointment within 3-5 days of discharge Complete Complete   HRI or Home Care Consult Complete Complete Complete  SW Recovery Care/Counseling Consult Complete Complete Complete  Palliative Care Screening Not Applicable Not Applicable Not Applicable  Skilled Nursing Facility Complete Complete Complete

## 2022-05-03 NOTE — Discharge Instructions (Signed)
Follow with Primary MD Ngetich, Dinah C, NP in 7 days   Get CBC, CMP,  checked  by Primary MD next visit.    Activity: As tolerated with Full fall precautions use walker/cane & assistance as needed   Disposition Home    Diet: Heart Healthy / carb modified    On your next visit with your primary care physician please Get Medicines reviewed and adjusted.   Please request your Prim.MD to go over all Hospital Tests and Procedure/Radiological results at the follow up, please get all Hospital records sent to your Prim MD by signing hospital release before you go home.   If you experience worsening of your admission symptoms, develop shortness of breath, life threatening emergency, suicidal or homicidal thoughts you must seek medical attention immediately by calling 911 or calling your MD immediately  if symptoms less severe.  You Must read complete instructions/literature along with all the possible adverse reactions/side effects for all the Medicines you take and that have been prescribed to you. Take any new Medicines after you have completely understood and accpet all the possible adverse reactions/side effects.   Do not drive, operating heavy machinery, perform activities at heights, swimming or participation in water activities or provide baby sitting services if your were admitted for syncope or siezures until you have seen by Primary MD or a Neurologist and advised to do so again.  Do not drive when taking Pain medications.    Do not take more than prescribed Pain, Sleep and Anxiety Medications  Special Instructions: If you have smoked or chewed Tobacco  in the last 2 yrs please stop smoking, stop any regular Alcohol  and or any Recreational drug use.  Wear Seat belts while driving.   Please note  You were cared for by a hospitalist during your hospital stay. If you have any questions about your discharge medications or the care you received while you were in the hospital after  you are discharged, you can call the unit and asked to speak with the hospitalist on call if the hospitalist that took care of you is not available. Once you are discharged, your primary care physician will handle any further medical issues. Please note that NO REFILLS for any discharge medications will be authorized once you are discharged, as it is imperative that you return to your primary care physician (or establish a relationship with a primary care physician if you do not have one) for your aftercare needs so that they can reassess your need for medications and monitor your lab values.

## 2022-05-03 NOTE — Discharge Summary (Signed)
Physician Discharge Summary  Lori [name] [id number] DOB: 18-Aug-1970 DOA: 04/30/2022  PCP: [name], NP  Admit date: 04/30/2022 Discharge date: 05/03/2022  Admitted From: (Home) Disposition:  (Home )  Recommendations for Outpatient Follow-up:  Follow up with PCP in 1-2 weeks Please obtain BMP/CBC in one week Consider resuming olmesartan during next visit if renal function back to baseline.   Home Health: (YES) PT/OT/SLP/aid/RN   Discharge Condition: (Stable) CODE STATUS: (FULL) Diet recommendation: Heart Healthy / Carb Modified   Brief/Interim Summary:   Lori [name] is a 52 y.o. female with medical history significant of stroke with spastic hemiplegia and aphasia, wheelchair-bound, diabetes, gastroparesis, hyperlipidemia, depression, anxiety, hypothyroidism, GERD, hypertension, CAD, pheochromocytoma presenting with weakness and lethargy.  She was admitted for further workup. Vital signs in the ED stable. Lab workup included CMP with sodium 128, potassium 6.9, chloride 95, bicarb 19, BUN 66, creatinine elevated 2.79 from baseline of 0.7, glucose 313, calcium 9.3, protein 7.5, albumin 3.8. CBC notable for platelets of 92. Troponin normal with repeat pending. BNP pending.negative  Respiratory panel for flu COVID and RSV pending. Urinalysis pending. TSH normal. VBG with normal pH and normal pCO2. EKG noted to have sinus rhythm at 76 beats per minutes without any peaked T waves and QRS of 86. Patient received Lokelma, insulin, 2 L of IV fluids, calcium gluconate, albuterol in the ED. patient on multiple psychotropic medications, mentation has improved and back to baseline after holding these meds.  Please see discussion below.  AKI -Baseline creatinine is 0.55, peaked at 2.8, much improved with IV fluids, it is 1.26 on discharge .   Hyperkalemia -Resolved -Hold Benicar on discharge   Hyponatremia -Improved with IV fluids, hold Benicar on discharge.  Blood pressure  remains acceptable off Benicar.   Altered mental status/acute toxic encephalopathy -Likely due to taking several psychoactive medications at once. -Patient back to baseline, resumed on her home meds, but she was instructed not to take her as needed meds at more than recommended doses.  y   History of CVA Left-sided spastic hemiplegia Aphasia - As per HPI history of previous stroke with residual deficits on the left and aphasia.  Can get up and walk with assistance at times but is largely bed and wheelchair bound.   - Continue home Plavix, atorvastatin, Zetia -Home Home health PT/OT/aide/SLP and RN on discharge   Diabetes -BG were elevated as she was on a lower than home dose, she will be discharged on home dose insulin.   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 - Holding home olmesartan/hydrochlorothiazide as above.   Depression Anxiety -Resuming Lexapro and Ativan      Discharge Diagnoses:  Principal Problem:   AKI (acute kidney injury) ([place]) Active Problems:   Type 2 diabetes mellitus with hyperlipidemia ([place])   Essential hypertension   History of cerebral infarction   Hyperthyroidism   CAD (coronary artery disease)   Anxiety and depression   HLD (hyperlipidemia)   Spastic hemiparesis of left 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 (HCC)   GERD (gastroesophageal reflux disease)   Hyponatremia   Hyperkalemia   Lethargy  Discharge Instructions  Discharge Instructions     Discharge instructions   Complete by: As directed    Follow with Primary MD Ngetich, Dinah C, NP in 7 days   Get  CBC, CMP,  checked  by Primary MD next visit.    Activity: As tolerated with Full fall precautions use walker/cane & assistance as needed   Disposition Home    Diet: Heart Healthy / carb modified    On your next visit with your primary care physician please Get Medicines reviewed and adjusted.   Please request your Prim.MD to go over all Hospital Tests and Procedure/Radiological results at the follow up, please get all Hospital records sent to your Prim MD by signing hospital release before you go home.   If you experience worsening of your admission symptoms, develop shortness of breath, life threatening emergency, suicidal or homicidal thoughts you must seek medical attention immediately by calling 911 or calling your MD immediately  if symptoms less severe.  You Must read complete instructions/literature along with all the possible adverse reactions/side effects for all the Medicines you take and that have been prescribed to you. Take any new Medicines after you have completely understood and accpet all the possible adverse reactions/side effects.   Do not drive, operating heavy machinery, perform activities at heights, swimming or participation in water activities or provide baby sitting services if your were admitted for syncope or siezures until you have seen by Primary MD or a Neurologist and advised to do so again.  Do not drive when taking Pain medications.    Do not take more than prescribed Pain, Sleep and Anxiety Medications  Special Instructions: If you have smoked or chewed Tobacco  in the last 2 yrs please stop smoking, stop any regular Alcohol  and or any Recreational drug use.  Wear Seat belts while driving.   Please note  You were cared for by a hospitalist during your hospital stay. If you have any questions about your discharge medications or the care you received while you were in the hospital after you are discharged, you can call the unit and asked to speak  with the hospitalist on call if the hospitalist that took care of you is not available. Once you are discharged, your primary care physician will handle any further medical issues. Please note that NO REFILLS for any discharge medications will be authorized once you are discharged, as it is imperative that you return to your primary care physician (or establish a relationship with a primary care physician if you do not have one) for your aftercare needs so that they can reassess your need for medications and monitor your lab values   Increase activity slowly   Complete by: As directed       Allergies as of 05/03/2022       Reactions   Contrast Media [iodinated Contrast Media] Anaphylaxis, Swelling   Penicillins Swelling   Mouth swells up and eyes swollen shut   Sumatriptan Anaphylaxis   Aspirin Swelling   Ciprofloxacin    Feel nausea/vomitting   Morphine Swelling        Medication List     STOP taking these medications    hydrOXYzine 25 MG tablet Commonly known as: ATARAX   olmesartan-hydrochlorothiazide 40-25 MG tablet Commonly known as: BENICAR HCT   sulfamethoxazole-trimethoprim 800-160 MG tablet Commonly known as: BACTRIM DS       TAKE these medications    albuterol (2.5 MG/3ML) 0.083% nebulizer solution Commonly known as:  PROVENTIL Take 3 mLs (2.5 mg total) by nebulization every 6 (six) hours as needed for wheezing or shortness of breath.   amLODipine 10 MG tablet Commonly known as: NORVASC Take 1 tablet (10 mg total) by mouth daily. What changed: how much to take   atorvastatin 80 MG tablet Commonly known as: LIPITOR Take 80 mg by mouth daily.   baclofen 10 MG tablet Commonly known as: LIORESAL Take 0.5 tablets (5 mg total) by mouth 3 (three) times daily. X 1 week, then 10 mg TID-for spasticity- can increase  the dose if you call me to discuss What changed:  how much to take additional instructions   BD Pen Needle Nano U/F 32G X 4 MM Misc Generic  drug: Insulin Pen Needle Use to inject insulin 4 times daily as directed.   blood glucose meter kit and supplies Kit Dispense based on patient and insurance preference. Use up to four times daily as directed.   budesonide 0.25 MG/2ML nebulizer solution Commonly known as: PULMICORT Take 2 mLs (0.25 mg total) by nebulization 2 (two) times daily. What changed:  when to take this reasons to take this   clopidogrel 75 MG tablet Commonly known as: PLAVIX Take 75 mg by mouth daily.   Dexcom G5 Receiver Kit Devi 1 Device by Does not apply route 4 (four) times daily.   diclofenac Sodium 1 % Gel Commonly known as: VOLTAREN Apply 4 g topically 4 (four) times daily.   escitalopram 20 MG tablet Commonly known as: LEXAPRO Take 1 tablet (20 mg total) by mouth every morning.   ezetimibe 10 MG tablet Commonly known as: Zetia Take 1 tablet (10 mg total) by mouth daily.   gabapentin 100 MG capsule Commonly known as: NEURONTIN TAKE ONE CAPSULE BY MOUTH THREE TIMES DAILY FOR NEUROPATHY What changed: See the new instructions.   guaiFENesin 600 MG 12 hr tablet Commonly known as: Mucinex Take 1 tablet (600 mg total) by mouth 2 (two) times daily.   insulin aspart 100 UNIT/ML FlexPen Commonly known as: NOVOLOG Inject 7 Units into the skin 3 (three) times daily with meals. What changed: how much to take   insulin glargine 100 UNIT/ML Solostar Pen Commonly known as: LANTUS Inject 38 Units into the skin at bedtime.   LORazepam 0.5 MG tablet Commonly known as: ATIVAN Take 1 tablet (0.5 mg total) by mouth at bedtime.   MELATONIN GUMMIES PO Take 9 mg by mouth at bedtime as needed (for sleep).   methimazole 5 MG tablet Commonly known as: TAPAZOLE TAKE 1 TABLET BY MOUTH EVERY MORNING What changed: when to take this   metoprolol tartrate 100 MG tablet Commonly known as: LOPRESSOR TAKE 1 TABLET BY MOUTH TWICE DAILY (BREAKFAST, BEDTIME) What changed: See the new instructions.   nystatin  cream Commonly known as: MYCOSTATIN Apply 1 Application topically 2 (two) times daily. Affected areas on breast fold and groin and sacral areas   pantoprazole 40 MG tablet Commonly known as: PROTONIX Take 1 tablet (40 mg total) by mouth every morning.   polyethylene glycol powder 17 GM/SCOOP powder Commonly known as: GLYCOLAX/MIRALAX Take 17 g by mouth daily. Hold for loose stool What changed:  when to take this reasons to take this   senna 8.6 MG tablet Commonly known as: SENOKOT Take 8.6 mg by mouth daily.   tamsulosin 0.4 MG Caps capsule Commonly known as: FLOMAX TAKE 1 CAPSULE BY MOUTH AT BEDTIME What changed: when to take this   topiramate 25 MG tablet Commonly  known as: TOPAMAX TAKE 1 TABLET BY MOUTH TWICE DAILY FOR NEUROPATHY What changed: See the new instructions.   zinc oxide 11.3 % Crea cream Commonly known as: BALMEX Apply 1 Application topically 2 (two) times daily.        Follow-up [place], [facility] Follow up.   Contact information: [place] [address] [phone]                Allergies  Allergen Reactions   Contrast Media [Iodinated Contrast Media] Anaphylaxis and Swelling   Penicillins Swelling    Mouth swells up and eyes swollen shut   Sumatriptan Anaphylaxis   Aspirin Swelling   Ciprofloxacin     Feel nausea/vomitting   Morphine Swelling    Consultations: None   Procedures/Studies: US RENAL  Result Date: 04/30/2022 CLINICAL DATA:  Acute kidney injury. EXAM: RENAL / URINARY TRACT ULTRASOUND COMPLETE COMPARISON:  CT AP 07/10/2021 FINDINGS: Right Kidney: Renal measurements: 10.0 x 4.4 x 3.7 cm = volume: 85.5 mL. Echogenicity within normal limits. No mass or hydronephrosis visualized. Left Kidney: Renal measurements: 9.8 x 5.3 x 5.4 cm = volume: 145.8 mL. Echogenicity within normal limits. No mass or hydronephrosis visualized. Bladder: Appears normal for degree of  bladder distention. Other: Exam detail is diminished secondary to patient body habitus. IMPRESSION: No acute abnormality.  No signs of obstructive uropathy. Electronically Signed   By: Kerby Moors M.D.   On: 04/30/2022 15:43   CT Head Wo Contrast  Result Date: 04/30/2022 CLINICAL DATA:  Left-sided weakness, acute stroke suspected, previous history of stroke with left-sided weakness EXAM: CT HEAD WITHOUT CONTRAST TECHNIQUE: Contiguous axial images were obtained from the base of the skull through the vertex without intravenous contrast. RADIATION DOSE REDUCTION: This exam was performed according to the departmental dose-optimization program which includes automated exposure control, adjustment of the mA and/or kV according to patient size and/or use of iterative reconstruction technique. COMPARISON:  11/03/2021 FINDINGS: Brain: No evidence of acute infarction, hemorrhage, hydrocephalus, extra-axial collection or mass lesion/mass effect. Periventricular and deep white matter hypodensity. Unchanged small bilateral lacunar infarctions of the basal ganglia (series 3, image 20). Unchanged encephalomalacia of the left frontal lobe (series 3, image 16) and left cerebellar hemisphere (series 3, image 26). Vascular: No hyperdense vessel or unexpected calcification. Skull: Normal. Negative for fracture or focal lesion. Sinuses/Orbits: No acute finding. Other: None. IMPRESSION: 1. No CT evidence of acute intracranial pathology. 2. Small-vessel white matter disease significantly advanced for patient age. 3. Unchanged small bilateral lacunar infarctions of the basal ganglia. Unchanged encephalomalacia of the left frontal lobe and left cerebellar hemisphere. 4. Consider MRI to more sensitively evaluate for acute diffusion restricting infarction if clinically suspected. Electronically Signed   By: Delanna Ahmadi M.D.   On: 04/30/2022 13:00   DG Chest Portable 1 View  Result Date: 04/30/2022 CLINICAL DATA:  Hypoxia EXAM:  PORTABLE CHEST 1 VIEW COMPARISON:  CXR 11/03/21 FINDINGS: No pleural effusion. No pneumothorax. Low lung volumes. Left basilar atelectasis. Likely unchanged cardiac and mediastinal contours when accounting for differences in lung volumes. Status post median sternotomy. No focal airspace opacity. No radiographically apparent displaced rib fractures. Visualized upper abdomen is unremarkable. Surgical clips in the right upper quadrant IMPRESSION: Low lung volumes likely left basilar atelectasis. Electronically Signed   By: Marin Roberts M.D.   On: 04/30/2022 11:58      Subjective:  No significant events overnight, she denies any complaints. Discharge Exam:  Vitals:   05/03/22 0407 05/03/22 0743  BP: 113/84 114/82  Pulse: 78 73  Resp: 16 12  Temp: 97.7 F (36.5 C) (!) 97.4 F (36.3 C)  SpO2: 98% 99%   Vitals:   05/02/22 2348 05/03/22 0404 05/03/22 0407 05/03/22 0743  BP: 119/78  113/84 114/82  Pulse: 93  78 73  Resp: 16  16 12   Temp: 97.9 F (36.6 C)  97.7 F (36.5 C) (!) 97.4 F (36.3 C)  TempSrc: Oral  Oral Oral  SpO2: 97%  98% 99%  Weight:  93.2 kg    Height:  5\' 2"  (1.575 m)      General: Pt is alert, awake, not in acute distress, chronic left side weakness, slow specch Cardiovascular: RRR, S1/S2 +, no rubs, no gallops Respiratory: CTA bilaterally, no wheezing, no rhonchi Abdominal: Soft, NT, ND, bowel sounds + Extremities: no edema, no cyanosis    The results of significant diagnostics from this hospitalization (including imaging, microbiology, ancillary and laboratory) are listed below for reference.     Microbiology: Recent Results (from the past 240 hour(s))  Resp panel by RT-PCR (RSV, Flu A&B, Covid) Anterior Nasal Swab     Status: None   Collection Time: 04/30/22 11:27 AM   Specimen: Anterior Nasal Swab  Result Value Ref Range Status   SARS Coronavirus 2 by RT PCR NEGATIVE NEGATIVE Final   Influenza A by PCR NEGATIVE NEGATIVE Final   Influenza B by PCR NEGATIVE  NEGATIVE Final    Comment: (NOTE) The Xpert Xpress SARS-CoV-2/FLU/RSV plus assay is intended as an aid in the diagnosis of influenza from Nasopharyngeal swab specimens and should not be used as a sole basis for treatment. Nasal washings and aspirates are unacceptable for Xpert Xpress SARS-CoV-2/FLU/RSV testing.  Fact Sheet for Patients: EntrepreneurPulse.com.au  Fact Sheet for Healthcare Providers: IncredibleEmployment.be  This test is not yet approved or cleared by the [place] FDA and has been authorized for detection and/or diagnosis of SARS-CoV-2 by FDA under an Emergency Use Authorization (EUA). This EUA will remain in effect (meaning this test can be used) for the duration of the COVID-19 declaration under Section 564(b)(1) of the Act, 21 U.S.C. section 360bbb-3(b)(1), unless the authorization is terminated or revoked.     Resp Syncytial Virus by PCR NEGATIVE NEGATIVE Final    Comment: (NOTE) Fact Sheet for Patients: EntrepreneurPulse.com.au  Fact Sheet for Healthcare Providers: IncredibleEmployment.be  This test is not yet approved or cleared by the [place] FDA and has been authorized for detection and/or diagnosis of SARS-CoV-2 by FDA under an Emergency Use Authorization (EUA). This EUA will remain in effect (meaning this test can be used) for the duration of the COVID-19 declaration under Section 564(b)(1) of the Act, 21 U.S.C. section 360bbb-3(b)(1), unless the authorization is terminated or revoked.  Performed at [facility], [place] [address], [place], [place] [address]      Labs: BNP (last 3 results) Recent Labs    04/30/22 1245  BNP 123456   Basic Metabolic Panel: Recent Labs  Lab 05/01/22 0200 05/01/22 0837 05/01/22 1257 05/02/22 0101 05/03/22 0104  NA 131* 132* 129* 131* 130*  K 4.3 4.3 4.7 4.3 4.3  CL 97* 99 96* 97* 97*  CO2 20* 21* 21* 27 22  GLUCOSE 165*  137* 231* 292* 335*  BUN 52* 45* 43* 40* 31*  CREATININE 1.81* 1.62* 1.55* 1.71* 1.26*  CALCIUM 9.1 9.4 8.9 9.1 9.0   Liver Function Tests: Recent Labs  Lab 04/30/22  1128 05/01/22 0200  AST 13* 11*  ALT 17 13  ALKPHOS 110 89  BILITOT 0.7 0.8  PROT 7.5 6.6  ALBUMIN 3.8 3.2*   No results for input(s): "LIPASE", "AMYLASE" in the last 168 hours. No results for input(s): "AMMONIA" in the last 168 hours. CBC: Recent Labs  Lab 04/30/22 1245 04/30/22 1508 04/30/22 1525 05/01/22 0200 05/03/22 0104  WBC 6.7  --   --  8.9 7.3  NEUTROABS 4.4  --   --   --   --   HGB 13.6 14.3 14.3 12.0 12.2  HCT 40.7 42.0 42.0 34.9* 36.1  MCV 77.5*  --   --  75.2* 76.0*  PLT 92*  --   --  370 355   Cardiac Enzymes: No results for input(s): "CKTOTAL", "CKMB", "CKMBINDEX", "TROPONINI" in the last 168 hours. BNP: Invalid input(s): "POCBNP" CBG: Recent Labs  Lab 05/02/22 1229 05/02/22 1726 05/02/22 2223 05/02/22 2359 05/03/22 0651  GLUCAP 286* 273* 323* 326* 282*   D-Dimer No results for input(s): "DDIMER" in the last 72 hours. Hgb A1c No results for input(s): "HGBA1C" in the last 72 hours. Lipid Profile No results for input(s): "CHOL", "HDL", "LDLCALC", "TRIG", "CHOLHDL", "LDLDIRECT" in the last 72 hours. Thyroid function studies Recent Labs    04/30/22 1128  TSH 1.865   Anemia work up No results for input(s): "VITAMINB12", "FOLATE", "FERRITIN", "TIBC", "IRON", "RETICCTPCT" in the last 72 hours. Urinalysis    Component Value Date/Time   COLORURINE YELLOW 04/30/2022 2217   APPEARANCEUR HAZY (A) 04/30/2022 2217   LABSPEC 1.011 04/30/2022 2217   PHURINE 5.0 04/30/2022 2217   GLUCOSEU NEGATIVE 04/30/2022 2217   HGBUR NEGATIVE 04/30/2022 2217   [place] NEGATIVE 04/30/2022 2217   [place] NEGATIVE 04/30/2022 2217   PROTEINUR NEGATIVE 04/30/2022 2217   NITRITE NEGATIVE 04/30/2022 2217   LEUKOCYTESUR LARGE (A) 04/30/2022 2217   Sepsis Labs Recent Labs  Lab 04/30/22 1245  05/01/22 0200 05/03/22 0104  WBC 6.7 8.9 7.3   Microbiology Recent Results (from the past 240 hour(s))  Resp panel by RT-PCR (RSV, Flu A&B, Covid) Anterior Nasal Swab     Status: None   Collection Time: 04/30/22 11:27 AM   Specimen: Anterior Nasal Swab  Result Value Ref Range Status   SARS Coronavirus 2 by RT PCR NEGATIVE NEGATIVE Final   Influenza A by PCR NEGATIVE NEGATIVE Final   Influenza B by PCR NEGATIVE NEGATIVE Final    Comment: (NOTE) The Xpert Xpress SARS-CoV-2/FLU/RSV plus assay is intended as an aid in the diagnosis of influenza from Nasopharyngeal swab specimens and should not be used as a sole basis for treatment. Nasal washings and aspirates are unacceptable for Xpert Xpress SARS-CoV-2/FLU/RSV testing.  Fact Sheet for Patients: EntrepreneurPulse.com.au  Fact Sheet for Healthcare Providers: IncredibleEmployment.be  This test is not yet approved or cleared by the [place] FDA and has been authorized for detection and/or diagnosis of SARS-CoV-2 by FDA under an Emergency Use Authorization (EUA). This EUA will remain in effect (meaning this test can be used) for the duration of the COVID-19 declaration under Section 564(b)(1) of the Act, 21 U.S.C. section 360bbb-3(b)(1), unless the authorization is terminated or revoked.     Resp Syncytial Virus by PCR NEGATIVE NEGATIVE Final    Comment: (NOTE) Fact Sheet for Patients: EntrepreneurPulse.com.au  Fact Sheet for Healthcare Providers: IncredibleEmployment.be  This test is not yet approved or cleared by the [place] FDA and has been authorized for detection and/or diagnosis of SARS-CoV-2 by FDA under  an Emergency Use Authorization (EUA). This EUA will remain in effect (meaning this test can be used) for the duration of the COVID-19 declaration under Section 564(b)(1) of the Act, 21 U.S.C. section 360bbb-3(b)(1), unless the  authorization is terminated or revoked.  Performed at [facility], [place] [address] [place], [place] [address]      Time coordinating discharge: Over 30 minutes  SIGNED:   Phillips Climes, MD  [facility] 05/03/2022, 9:48 AM Pager   If 7PM-7AM, please contact night-coverage [email or web address]

## 2022-05-03 NOTE — Progress Notes (Signed)
Pt has refused Heparin SQ. MD aware per RN day shift reported.  Pt is alert and fully oriented x 4. She has complaints of constipation and heartburn. Miralax and Maalox give. She denies abdominal pain, no nausea or vomiting.  Pt had small one BM yesterday per RN reported. Increasing fiber from fruit and vegetables and fluid intake is encouraged.   Pt is stable hemodynamically, afebrile, no acute distress noted. We will monitor.  Kennyth Lose, RN

## 2022-05-03 NOTE — Progress Notes (Signed)
Physical Therapy Treatment Patient Details Name: Lori [name] MRN: [id number] DOB: 1970/10/05 Today's Date: 05/03/2022   History of Present Illness Pt is a 52 y/o female admitted 04/30/22 for AKI, hyperkalemia, hyponatremia and AMS in setting of accidental polypharmacy. PMH: CVA, aphasia, DM, gastroparesis, HLD, depression, anxiety, hypothyroidism, GERD, HTN, CAD.    PT Comments    Pt received in supine, agreeable to therapy session and with good participation and tolerance for transfer training and bed mobility. Pt c/o bowel urgency so gait distance limited to ~6ft pivotal steps with RW to [facility]. Pt needing up to modA for bed mobility and up to minA for transfers from elevated surface height to RW. Pt continues to benefit from PT services to progress toward functional mobility goals.    Recommendations for follow up therapy are one component of a multi-disciplinary discharge planning process, led by the attending physician.  Recommendations may be updated based on patient status, additional functional criteria and insurance authorization.  Follow Up Recommendations       Assistance Recommended at Discharge Intermittent Supervision/Assistance  Patient can return home with the following A little help with walking and/or transfers;Assistance with cooking/housework;Assist for transportation;Help with stairs or ramp for entrance;Two people to help with bathing/dressing/bathroom;Assistance with feeding   Equipment Recommendations  None recommended by PT    Recommendations for Other Services       Precautions / Restrictions Precautions Precautions: Fall Precaution Comments: L hemiparesis Required Braces or Orthoses: Other Brace Other Brace: L resting hand splint (for night time use per OT) Restrictions Weight Bearing Restrictions: No     Mobility  Bed Mobility Overal bed mobility: Needs Assistance Bed Mobility: Rolling, Sidelying to Sit Rolling: Mod assist Sidelying to sit: Min  assist       General bed mobility comments: pt had difficulty reaching cross-body for opposite rail, needed modA to roll toward her L side and then able to reach cross-body with stronger RUE to pull trunk/hips forward and upright, minA for trunk support and lifting. HOB ~20* during transfer.    Transfers Overall transfer level: Needs assistance Equipment used: Rolling walker (2 wheels) Transfers: Sit to/from Stand Sit to Stand: Min guard, From elevated surface   Step pivot transfers: Min assist, From elevated surface       General transfer comment: Pt attempts partial stand from lowest bed height then requests PTA raise bed a few inches (pt has hospital bed at home). From elevated surface, min guard with HHA to place safely on L RW handle and minA for stability and RW management while pivoting toward her R side to BSC ~4.5 ft. Elevated BSC height so only min guard to sit to this surface.    Ambulation/Gait               General Gait Details: step pivot transfer to [facility] only due to pt c/o bowel urgency. NT aware pt sitting up on [facility] and pt call bell within reach of her RUE. Narrow BOS while turning and mild posterior lean, cues needed for RW proximity and upright posture. With shoes donned pt states she feels more stable this date.   Stairs             Wheelchair Mobility    Modified Rankin (Stroke Patients Only)       Balance Overall balance assessment: Needs assistance Sitting-balance support: No upper extremity supported, Feet supported Sitting balance-Leahy Scale: Fair     Standing balance support: Single extremity supported, Bilateral upper extremity supported, During  functional activity Standing balance-Leahy Scale: Poor Standing balance comment: Fair static standing at RW, poor dynamic standing (while turning)                            Cognition Arousal/Alertness: Awake/alert Behavior During Therapy: Flat affect Overall Cognitive Status:  History of cognitive impairments - at baseline                                 General Comments: impaired cognition since CVA, slower processing, memory deficits. follows directions with increased time. Pt carries on conversation well about what she did on previous birthdays (32th, 42th) which came up because she is turning 53 this year.        Exercises      General Comments General comments (skin integrity, edema, etc.): no acute s/sx distress or c/o dizziness with transfers      Pertinent Vitals/Pain Pain Assessment Pain Assessment: No/denies pain Pain Intervention(s): Monitored during session, Repositioned     PT Goals (current goals can now be found in the care plan section) Acute Rehab PT Goals Patient Stated Goal: to feel better and less constipated Progress towards PT goals: Progressing toward goals    Frequency    Min 3X/week      PT Plan Current plan remains appropriate       AM-PAC PT "6 Clicks" Mobility   Outcome Measure  Help needed turning from your back to your side while in a flat bed without using bedrails?: A Lot Help needed moving from lying on your back to sitting on the side of a flat bed without using bedrails?: A Lot (without rails) Help needed moving to and from a bed to a chair (including a wheelchair)?: A Lot Help needed standing up from a chair using your arms (e.g., wheelchair or bedside chair)?: A Lot Help needed to walk in hospital room?: Total (<58ft today) Help needed climbing 3-5 steps with a railing? : Total 6 Click Score: 10    End of Session Equipment Utilized During Treatment: Gait belt Activity Tolerance: Patient tolerated treatment well;Other (comment) (c/o constipation and bowel urgency, wanting to use BSC so gait distance limited due to this.) Patient left: in chair;with call bell/phone within reach;Other (comment) (up on [facility], NT/RN aware) Nurse Communication: Mobility status;Other (comment) (c/o  constipation) PT Visit Diagnosis: Muscle weakness (generalized) (M62.81)     Time: [id number] PT Time Calculation (min) (ACUTE ONLY): 11 min  Charges:  $Therapeutic Activity: 8-22 mins                     Shereda Graw P., PTA Acute Rehabilitation Services Secure Chat Preferred 9a-5:30pm Office: [place] 05/03/2022, 12:00 PM

## 2022-05-03 NOTE — Progress Notes (Signed)
Occupational Therapy Treatment Patient Details Name: Lori [name] MRN: [id number] DOB: 09/13/1970 Today's Date: 05/03/2022   History of present illness Pt is a 52 y/o female admitted 04/30/22 for AKI, hyperkalemia, hyponatremia and AMS in setting of accidental polypharmacy. PMH: CVA, aphasia, DM, gastroparesis, HLD, depression, anxiety, hypothyroidism, GERD, HTN, CAD   OT comments  Pt received in bathroom with NT present. Pt able to mobilize with Min A from bathroom with RW, Mod A for toileting hygiene w/ encouragement for pt to attempt task prior to assistance being given. Assessed L resting hand splint w/ modifications to allow improved fit for digits. Educated re: nighttime wear, skin assessment prior to and after wearing, color coded strap application and notifying [place] therapists if redness or irritation occurs without resolve within 30 min of doffing splint.    Recommendations for follow up therapy are one component of a multi-disciplinary discharge planning process, led by the attending physician.  Recommendations may be updated based on patient status, additional functional criteria and insurance authorization.    Assistance Recommended at Discharge Intermittent Supervision/Assistance  Patient can return home with the following  A little help with walking and/or transfers;A lot of help with bathing/dressing/bathroom;Direct supervision/assist for medications management;Direct supervision/assist for financial management   Equipment Recommendations  None recommended by OT    Recommendations for Other Services      Precautions / Restrictions Precautions Precautions: Fall Precaution Comments: L hemiparesis Required Braces or Orthoses: Other Brace Other Brace: L resting hand splint Restrictions Weight Bearing Restrictions: No       Mobility Bed Mobility Overal bed mobility: Needs Assistance Bed Mobility: Sit to Supine       Sit to supine: Mod assist   General bed  mobility comments: Light assist for BLE to bed and assist to move trunk to center of bed once supine    Transfers Overall transfer level: Needs assistance Equipment used: Rolling walker (2 wheels), 1 person hand held assist Transfers: Sit to/from Stand Sit to Stand: Min guard           General transfer comment: to stand from toilet     Balance Overall balance assessment: Needs assistance Sitting-balance support: No upper extremity supported, Feet supported Sitting balance-Leahy Scale: Fair     Standing balance support: Single extremity supported, Bilateral upper extremity supported, During functional activity Standing balance-Leahy Scale: Poor                             ADL either performed or assessed with clinical judgement   ADL Overall ADL's : Needs assistance/impaired;At baseline                         Toilet Transfer: Minimal assistance;Ambulation;Rolling walker (2 wheels) Toilet Transfer Details (indicate cue type and reason): received in bathroom s/p NT assist to toilet. Able to stand with min guard from toilet and ambulate back to bed with Min A for RW navigation Toileting- Clothing Manipulation and Hygiene: Moderate assistance;Sitting/lateral lean;Sit to/from stand Toileting - Clothing Manipulation Details (indicate cue type and reason): cued to attempt to reach with RUE with pt able to almost reach to posterior hygiene for hygiene, required assist for thoroughness     Functional mobility during ADLs: Minimal assistance;Rolling walker (2 wheels)      Extremity/Trunk Assessment Upper Extremity Assessment Upper Extremity Assessment: LUE deficits/detail LUE Deficits / Details: some muscle activation at all joints. reports receiving botox injection in  L UE and able to open hand more. Noted contractures of L MCPs. reports not having resting hand splint at home and open for receiving one. Assessed L resting hand splint fit after delivery on 3/29  w/ some adjustments made to fit all digits due to pt's small hand LUE Coordination: decreased fine motor;decreased gross motor   Lower Extremity Assessment Lower Extremity Assessment: Defer to PT evaluation        Vision   Vision Assessment?: No apparent visual deficits   Perception     Praxis      Cognition Arousal/Alertness: Awake/alert Behavior During Therapy: Flat affect Overall Cognitive Status: History of cognitive impairments - at baseline                                 General Comments: impaired cognition since CVA, slower processing, memory deficits. follows directions with increased time. noted to ask for assist with tasks that pt likely able to do without assist (combing hair, ordering lunch, finding call bell to turn on tv)        Exercises      Shoulder Instructions       General Comments      Pertinent Vitals/ Pain       Pain Assessment Pain Assessment: Faces Faces Pain Scale: Hurts a little bit Pain Location: L hand with splint fitting Pain Descriptors / Indicators: Grimacing, Guarding Pain Intervention(s): Monitored during session  Home Living                                          Prior Functioning/Environment              Frequency  Min 2X/week        Progress Toward Goals  OT Goals(current goals can now be found in the care plan section)  Progress towards OT goals: Progressing toward goals  Acute Rehab OT Goals Patient Stated Goal: home today OT Goal Formulation: With patient/family Time For Goal Achievement: 05/16/22 Potential to Achieve Goals: Good  Plan Discharge plan remains appropriate    Co-evaluation                 AM-PAC OT "6 Clicks" Daily Activity     Outcome Measure   Help from another person eating meals?: A Little Help from another person taking care of personal grooming?: A Little Help from another person toileting, which includes using toliet, bedpan, or urinal?:  Total Help from another person bathing (including washing, rinsing, drying)?: A Lot Help from another person to put on and taking off regular upper body clothing?: A Lot Help from another person to put on and taking off regular lower body clothing?: Total 6 Click Score: 12    End of Session Equipment Utilized During Treatment: Rolling walker (2 wheels)  OT Visit Diagnosis: Other abnormalities of gait and mobility (R26.89);Muscle weakness (generalized) (M62.81)   Activity Tolerance Patient tolerated treatment well   Patient Left in bed;with call bell/phone within reach;with bed alarm set   Nurse Communication Mobility status        Time: [id number] OT Time Calculation (min): 24 min  Charges: OT General Charges $OT Visit: 1 Visit OT Treatments $Self Care/Home Management : 8-22 mins $Therapeutic Activity: 8-22 mins  Malachy Chamber, OTR/L Acute Rehab Services Office: [phone]   Layla Maw 05/03/2022,  11:31 AM

## 2022-05-06 ENCOUNTER — Telehealth: Payer: Self-pay

## 2022-05-06 NOTE — Transitions of Care (Post Inpatient/ED Visit) (Unsigned)
   05/06/2022  Name: Lori [name] MRN: [id number] DOB: 07/15/1970  Today's TOC FU Call Status: Today's TOC FU Call Status:: Unsuccessul Call (1st Attempt) Unsuccessful Call (1st Attempt) Date: 05/06/22  Attempted to reach the patient regarding the most recent Inpatient/ED visit.  Follow Up Plan: Additional outreach attempts will be made to reach the patient to complete the Transitions of Care (Post Inpatient/ED visit) call.   Signature: Sharyon Peitz.D/RMA

## 2022-05-06 NOTE — Telephone Encounter (Signed)
Patient called stating that she needs new referrals for physical therapy, occupational therapy, speech therapy and nursing because of change in insurance. She also needs some one to come o the home to fix her medications for her and still needs to be fitted for power chair. She is willing to use any company,but [employer].  Message sent to Marlowe Sax, NP

## 2022-05-06 NOTE — Telephone Encounter (Signed)
Will need appointment for evaluation for Home health Physical Therapy,Occupation and HHN

## 2022-05-07 NOTE — Transitions of Care (Post Inpatient/ED Visit) (Signed)
   05/07/2022  Name: Lori [name] MRN: [id number] DOB: 14-Dec-1970  Today's TOC FU Call Status: Today's TOC FU Call Status:: Successful TOC FU Call Competed Unsuccessful Call (1st Attempt) Date: 05/06/22 [facility] FU Call Complete Date: 05/07/22  Transition Care Management Follow-up Telephone Call Date of Discharge: 05/03/22 Discharge [facility]: [facility]) Type of Discharge: Inpatient Admission Primary Inpatient Discharge Diagnosis:: Hyperkalemia How have you been since you were released from the hospital?: Better Any questions or concerns?: Yes  Items Reviewed: Did you receive and understand the discharge instructions provided?: Yes Medications obtained and verified?: No Any new allergies since your discharge?: No Dietary orders reviewed?: No Do you have support at home?: Yes People in Home: spouse  Home Care and Equipment/Supplies: [place] Ordered?: No Any new equipment or medical supplies ordered?: No  Functional Questionnaire: Do you need assistance with bathing/showering or dressing?: Yes Do you need assistance with meal preparation?: Yes Do you need assistance with eating?: Yes Do you have difficulty maintaining continence: No Do you need assistance with getting out of bed/getting out of a chair/moving?: Yes Do you have difficulty managing or taking your medications?: Yes  Follow up appointments reviewed: PCP Follow-up appointment confirmed?: Yes Date of PCP follow-up appointment?: 05/08/22 Follow-up Provider: Marlowe Sax, NP [facility] Follow-up appointment confirmed?: No Do you need transportation to your follow-up appointment?: No Do you understand care options if your condition(s) worsen?: Yes-patient verbalized understanding    SIGNATURE: [place].D/RMA

## 2022-05-08 ENCOUNTER — Encounter: Payer: Self-pay | Admitting: Family

## 2022-05-08 ENCOUNTER — Ambulatory Visit (INDEPENDENT_AMBULATORY_CARE_PROVIDER_SITE_OTHER): Payer: Medicare HMO | Admitting: Family

## 2022-05-08 VITALS — BP 138/80 | HR 97 | Temp 97.1°F | Resp 18 | Wt 188.2 lb

## 2022-05-08 DIAGNOSIS — M24542 Contracture, left hand: Secondary | ICD-10-CM | POA: Diagnosis not present

## 2022-05-08 DIAGNOSIS — K219 Gastro-esophageal reflux disease without esophagitis: Secondary | ICD-10-CM | POA: Diagnosis not present

## 2022-05-08 DIAGNOSIS — E875 Hyperkalemia: Secondary | ICD-10-CM

## 2022-05-08 DIAGNOSIS — F419 Anxiety disorder, unspecified: Secondary | ICD-10-CM | POA: Diagnosis not present

## 2022-05-08 DIAGNOSIS — Z8673 Personal history of transient ischemic attack (TIA), and cerebral infarction without residual deficits: Secondary | ICD-10-CM

## 2022-05-08 DIAGNOSIS — R2681 Unsteadiness on feet: Secondary | ICD-10-CM | POA: Diagnosis not present

## 2022-05-08 DIAGNOSIS — F32A Depression, unspecified: Secondary | ICD-10-CM

## 2022-05-08 DIAGNOSIS — I10 Essential (primary) hypertension: Secondary | ICD-10-CM

## 2022-05-08 DIAGNOSIS — J4520 Mild intermittent asthma, uncomplicated: Secondary | ICD-10-CM

## 2022-05-08 MED ORDER — ALBUTEROL SULFATE HFA 108 (90 BASE) MCG/ACT IN AERS: 2.0000 | INHALATION_SPRAY | Freq: Four times a day (QID) | RESPIRATORY_TRACT | 2 refills | Status: DC | PRN

## 2022-05-08 NOTE — Progress Notes (Signed)
Provider: Richarda Blade FNP-C  Dalicia Kisner, Donalee Citrin, NP  Patient Care Team: Legend Pecore, Donalee Citrin, NP as PCP - General (Family Medicine) Orbie Pyo, MD as PCP - Cardiology (Cardiology)  Extended Emergency Contact Information Primary Emergency Contact: [name],[name] Mobile Phone: [phone] Relation: Spouse Preferred language: English Interpreter needed? No Secondary Emergency Contact: [name],[name] Home Phone: [phone] Mobile Phone: [phone] Relation: Daughter Mother: Blenda Peals Phone: [phone]  Code Status: Full code Goals of care: Advanced Directive information    05/03/2022    3:41 AM  Advanced Directives  Does Patient Have a Medical Advance Directive? No  Would patient like information on creating a medical advance directive? No - Patient declined     Chief Complaint  Patient presents with   Transitions Of Care    TOC from 05/05/2022    HPI:  Pt is a 52 y.o. female seen today for an acute visit for transition of care post hospitalization from 04/30/2022 -05/03/2022 for weakness lethargy.  Lab work was noted with sodium 128, potassium 6.9, creatinine 2.79 elevated from baseline of 0.7, glucose 313, calcium 9.3, platelets 92, B natruretic peptide and troponin were normal.  COVID-19 and influenza A and B test were negative. AKI improved with IV fluids down to 1.26 on discharge.  Hyperkalemia and hyponatremia also resolved Beneke was held on discharge.  Altered mental status also resolved thought due to her psychoactive medication was advised not to take her medication more than as needed. She was discharged on home health PT/OT/aide/SLP and RN. She is here with her husband today.  States doing well since she was discharged home but requests home health PT/OT/aide/SLP and RN states the previous company no longer paid by her insurance. Medication reconciled.   Past Medical History:  Diagnosis Date   CAD (coronary artery disease)    Depression     Diabetes mellitus without complication    History of CT scan    History of mammogram    History of MRI    Hypertension    Pheochromocytoma    Pheochromocytoma    Stroke    Thyroid disease    Past Surgical History:  Procedure Laterality Date   ABDOMINAL HYSTERECTOMY     CESAREAN SECTION     3   CORONARY ARTERY BYPASS GRAFT     Right adrenal gland removal for pheochromocytoma Right     Allergies  Allergen Reactions   Contrast Media [Iodinated Contrast Media] Anaphylaxis and Swelling   Penicillins Swelling    Mouth swells up and eyes swollen shut   Sumatriptan Anaphylaxis   Aspirin Swelling   Ciprofloxacin     Feel nausea/vomitting   Morphine Swelling    Outpatient Encounter Medications as of 05/08/2022  Medication Sig   albuterol (PROVENTIL) (2.5 MG/3ML) 0.083% nebulizer solution Take 3 mLs (2.5 mg total) by nebulization every 6 (six) hours as needed for wheezing or shortness of breath.   albuterol (VENTOLIN HFA) 108 (90 Base) MCG/ACT inhaler Inhale 2 puffs into the lungs every 6 (six) hours as needed for wheezing or shortness of breath.   atorvastatin (LIPITOR) 80 MG tablet Take 80 mg by mouth daily.   baclofen (LIORESAL) 10 MG tablet Take 0.5 tablets (5 mg total) by mouth 3 (three) times daily. X 1 week, then 10 mg TID-for spasticity- can increase  the dose if you call me to discuss   blood glucose meter kit and supplies KIT Dispense based on patient and insurance preference. Use up to four times daily as directed.  budesonide (PULMICORT) 0.25 MG/2ML nebulizer solution Take 2 mLs (0.25 mg total) by nebulization 2 (two) times daily.   clopidogrel (PLAVIX) 75 MG tablet Take 75 mg by mouth daily.   Continuous Blood Gluc Receiver (DEXCOM G5 RECEIVER KIT) DEVI 1 Device by Does not apply route 4 (four) times daily.   diclofenac Sodium (VOLTAREN) 1 % GEL Apply 4 g topically 4 (four) times daily.   escitalopram (LEXAPRO) 20 MG tablet Take 1 tablet (20 mg total) by mouth every  morning.   ezetimibe (ZETIA) 10 MG tablet Take 1 tablet (10 mg total) by mouth daily.   gabapentin (NEURONTIN) 100 MG capsule TAKE ONE CAPSULE BY MOUTH THREE TIMES DAILY FOR NEUROPATHY   guaiFENesin (MUCINEX) 600 MG 12 hr tablet Take 1 tablet (600 mg total) by mouth 2 (two) times daily.   insulin aspart (NOVOLOG) 100 UNIT/ML FlexPen Inject 7 Units into the skin 3 (three) times daily with meals. (Patient taking differently: Inject 15 Units into the skin 3 (three) times daily with meals.)   insulin glargine (LANTUS) 100 UNIT/ML Solostar Pen Inject 38 Units into the skin at bedtime.   Insulin Pen Needle 32G X 4 MM MISC Use to inject insulin 4 times daily as directed.   LORazepam (ATIVAN) 0.5 MG tablet Take 1 tablet (0.5 mg total) by mouth at bedtime.   MELATONIN GUMMIES PO Take 9 mg by mouth at bedtime as needed (for sleep).   methimazole (TAPAZOLE) 5 MG tablet TAKE 1 TABLET BY MOUTH EVERY MORNING   metoprolol tartrate (LOPRESSOR) 100 MG tablet TAKE 1 TABLET BY MOUTH TWICE DAILY (BREAKFAST, BEDTIME)   nystatin cream (MYCOSTATIN) Apply 1 Application topically 2 (two) times daily. Affected areas on breast fold and groin and sacral areas   pantoprazole (PROTONIX) 40 MG tablet TAKE 1 TABLET BY MOUTH EVERY MORNING   polyethylene glycol powder (GLYCOLAX/MIRALAX) 17 GM/SCOOP powder Take 17 g by mouth daily. Hold for loose stool   senna (SENOKOT) 8.6 MG tablet Take 8.6 mg by mouth daily.   tamsulosin (FLOMAX) 0.4 MG CAPS capsule TAKE 1 CAPSULE BY MOUTH AT BEDTIME   topiramate (TOPAMAX) 25 MG tablet TAKE 1 TABLET BY MOUTH TWICE DAILY FOR NEUROPATHY   zinc oxide (BALMEX) 11.3 % CREA cream Apply 1 Application topically 2 (two) times daily.   amLODipine (NORVASC) 10 MG tablet Take 1 tablet (10 mg total) by mouth daily. (Patient taking differently: Take 5 mg by mouth daily.)   No facility-administered encounter medications on file as of 05/08/2022.    Review of Systems  Constitutional:  Negative for appetite  change, chills, fatigue, fever and unexpected weight change.  HENT:  Negative for congestion, dental problem, ear discharge, ear pain, facial swelling, hearing loss, nosebleeds, postnasal drip, rhinorrhea, sinus pressure, sinus pain, sneezing, sore throat, tinnitus and trouble swallowing.   Eyes:  Negative for pain, discharge, redness, itching and visual disturbance.  Respiratory:  Negative for cough, chest tightness, shortness of breath and wheezing.   Cardiovascular:  Negative for chest pain, palpitations and leg swelling.  Gastrointestinal:  Negative for abdominal distention, abdominal pain, blood in stool, constipation, diarrhea, nausea and vomiting.  Endocrine: Negative for cold intolerance, heat intolerance, polydipsia, polyphagia and polyuria.  Genitourinary:  Negative for difficulty urinating, dysuria, flank pain, frequency and urgency.  Musculoskeletal:  Positive for gait problem. Negative for arthralgias, back pain, joint swelling, myalgias, neck pain and neck stiffness.  Skin:  Negative for color change, pallor, rash and wound.  Neurological:  Positive for weakness and numbness.  Negative for dizziness, syncope, speech difficulty, light-headedness and headaches.       Left hand contracture  Hematological:  Does not bruise/bleed easily.  Psychiatric/Behavioral:  Negative for agitation, behavioral problems, confusion, hallucinations, self-injury, sleep disturbance and suicidal ideas. The patient is not nervous/anxious.      There is no immunization history on file for this patient. Pertinent  Health Maintenance Due  Topic Date Due   FOOT EXAM  Never done   OPHTHALMOLOGY EXAM  Never done   COLONOSCOPY (Pts 45-53yrs Insurance coverage will need to be confirmed)  Never done   HEMOGLOBIN A1C  04/23/2022   INFLUENZA VACCINE  09/05/2022   PAP SMEAR-Modifier  Discontinued      01/15/2022    1:25 PM 01/23/2022    1:54 PM 03/05/2022    2:19 PM 03/29/2022   12:55 PM 04/08/2022   12:50 PM   Fall Risk  Falls in the past year? 1 0 1 0 1  Was there an injury with Fall? 1 0 1 0 0  Fall Risk Category Calculator 3 0 3 0 2  Fall Risk Category (Retired) High Low     (RETIRED) Patient Fall Risk Level Moderate fall risk Moderate fall risk     Patient at Risk for Falls Due to History of fall(s);Impaired balance/gait;Impaired mobility History of fall(s);Impaired balance/gait;Impaired mobility History of fall(s);Impaired balance/gait;Impaired mobility    Fall risk Follow up Falls evaluation completed;Education provided;Falls prevention discussed Falls evaluation completed Falls evaluation completed;Education provided;Falls prevention discussed     Functional Status Survey:    Vitals:   05/08/22 1339  BP: 138/80  Pulse: 97  Resp: 18  Temp: (!) 97.1 F (36.2 C)  SpO2: 98%  Weight: 188 lb 3.2 oz (85.4 kg)   Body mass index is 34.42 kg/m. Physical Exam Vitals reviewed.  Constitutional:      General: She is not in acute distress.    Appearance: Normal appearance. She is obese. She is not ill-appearing or diaphoretic.  HENT:     Head: Normocephalic.     Right Ear: Tympanic membrane, ear canal and external ear normal. There is no impacted cerumen.     Left Ear: Tympanic membrane, ear canal and external ear normal. There is no impacted cerumen.     Nose: Nose normal. No congestion or rhinorrhea.     Mouth/Throat:     Mouth: Mucous membranes are moist.     Pharynx: Oropharynx is clear. No oropharyngeal exudate or posterior oropharyngeal erythema.  Eyes:     General: No scleral icterus.       Right eye: No discharge.        Left eye: No discharge.     Extraocular Movements: Extraocular movements intact.     Conjunctiva/sclera: Conjunctivae normal.     Pupils: Pupils are equal, round, and reactive to light.  Neck:     Vascular: No carotid bruit.  Cardiovascular:     Rate and Rhythm: Normal rate and regular rhythm.     Pulses: Normal pulses.     Heart sounds: Normal heart  sounds. No murmur heard.    No friction rub. No gallop.  Pulmonary:     Effort: Pulmonary effort is normal. No respiratory distress.     Breath sounds: Normal breath sounds. No wheezing, rhonchi or rales.  Chest:     Chest wall: No tenderness.  Abdominal:     General: Bowel sounds are normal. There is no distension.     Palpations: Abdomen is soft. There  is no mass.     Tenderness: There is no abdominal tenderness. There is no right CVA tenderness, left CVA tenderness, guarding or rebound.  Musculoskeletal:        General: No swelling or tenderness. Normal range of motion.     Cervical back: Normal range of motion. No rigidity or tenderness.     Right lower leg: No edema.     Left lower leg: No edema.     Comments: Left hand contracture   Lymphadenopathy:     Cervical: No cervical adenopathy.  Skin:    General: Skin is warm and dry.     Coloration: Skin is not pale.     Findings: No bruising, erythema, lesion or rash.  Neurological:     Mental Status: She is alert and oriented to person, place, and time.     Cranial Nerves: No cranial nerve deficit.     Sensory: No sensory deficit.     Motor: No weakness.     Coordination: Coordination normal.     Gait: Gait abnormal.  Psychiatric:        Mood and Affect: Mood normal.        Speech: Speech normal.        Behavior: Behavior normal.        Thought Content: Thought content normal.        Judgment: Judgment normal.     Labs reviewed: Recent Labs    11/03/21 0811 11/04/21 0317 11/05/21 0259 11/06/21 0307 05/01/22 1257 05/02/22 0101 05/03/22 0104  NA  --  137 138   < > 129* 131* 130*  K  --  3.9 3.3*   < > 4.7 4.3 4.3  CL  --  112* 112*   < > 96* 97* 97*  CO2  --  18* 20*   < > 21* 27 22  GLUCOSE  --  233* 113*   < > 231* 292* 335*  BUN  --  18 20   < > 43* 40* 31*  CREATININE  --  0.64 0.69   < > 1.55* 1.71* 1.26*  CALCIUM  --  8.0* 8.3*   < > 8.9 9.1 9.0  MG 1.7 2.2 2.1  --   --   --   --    < > = values in this  interval not displayed.   Recent Labs    03/01/22 0828 04/30/22 1128 05/01/22 0200  AST 16 13* 11*  ALT 13 17 13   ALKPHOS 117 110 89  BILITOT 0.4 0.7 0.8  PROT 6.3 7.5 6.6  ALBUMIN 3.9 3.8 3.2*   Recent Labs    11/05/21 0259 11/06/21 0307 04/30/22 1245 04/30/22 1508 04/30/22 1525 05/01/22 0200 05/03/22 0104  WBC 11.7* 8.6 6.7  --   --  8.9 7.3  NEUTROABS 7.9* 5.0 4.4  --   --   --   --   HGB 12.5 13.0 13.6   < > 14.3 12.0 12.2  HCT 38.3 39.1 40.7   < > 42.0 34.9* 36.1  MCV 78.6* 77.4* 77.5*  --   --  75.2* 76.0*  PLT 318 287 92*  --   --  370 355   < > = values in this interval not displayed.   Lab Results  Component Value Date   TSH 1.865 04/30/2022   Lab Results  Component Value Date   HGBA1C 10.5 (H) 10/23/2021   Lab Results  Component Value Date   CHOL 148 03/01/2022  HDL 44 03/01/2022   LDLCALC 79 03/01/2022   TRIG 143 03/01/2022   CHOLHDL 3.4 03/01/2022    Significant Diagnostic Results in last 30 days:  US RENAL  Result Date: 04/30/2022 CLINICAL DATA:  Acute kidney injury. EXAM: RENAL / URINARY TRACT ULTRASOUND COMPLETE COMPARISON:  CT AP 07/10/2021 FINDINGS: Right Kidney: Renal measurements: 10.0 x 4.4 x 3.7 cm = volume: 85.5 mL. Echogenicity within normal limits. No mass or hydronephrosis visualized. Left Kidney: Renal measurements: 9.8 x 5.3 x 5.4 cm = volume: 145.8 mL. Echogenicity within normal limits. No mass or hydronephrosis visualized. Bladder: Appears normal for degree of bladder distention. Other: Exam detail is diminished secondary to patient body habitus. IMPRESSION: No acute abnormality.  No signs of obstructive uropathy. Electronically Signed   By: Signa Kell M.D.   On: 04/30/2022 15:43   CT Head Wo Contrast  Result Date: 04/30/2022 CLINICAL DATA:  Left-sided weakness, acute stroke suspected, previous history of stroke with left-sided weakness EXAM: CT HEAD WITHOUT CONTRAST TECHNIQUE: Contiguous axial images were obtained from the base  of the skull through the vertex without intravenous contrast. RADIATION DOSE REDUCTION: This exam was performed according to the departmental dose-optimization program which includes automated exposure control, adjustment of the mA and/or kV according to patient size and/or use of iterative reconstruction technique. COMPARISON:  11/03/2021 FINDINGS: Brain: No evidence of acute infarction, hemorrhage, hydrocephalus, extra-axial collection or mass lesion/mass effect. Periventricular and deep white matter hypodensity. Unchanged small bilateral lacunar infarctions of the basal ganglia (series 3, image 20). Unchanged encephalomalacia of the left frontal lobe (series 3, image 16) and left cerebellar hemisphere (series 3, image 26). Vascular: No hyperdense vessel or unexpected calcification. Skull: Normal. Negative for fracture or focal lesion. Sinuses/Orbits: No acute finding. Other: None. IMPRESSION: 1. No CT evidence of acute intracranial pathology. 2. Small-vessel white matter disease significantly advanced for patient age. 3. Unchanged small bilateral lacunar infarctions of the basal ganglia. Unchanged encephalomalacia of the left frontal lobe and left cerebellar hemisphere. 4. Consider MRI to more sensitively evaluate for acute diffusion restricting infarction if clinically suspected. Electronically Signed   By: Jearld Lesch M.D.   On: 04/30/2022 13:00   DG Chest Portable 1 View  Result Date: 04/30/2022 CLINICAL DATA:  Hypoxia EXAM: PORTABLE CHEST 1 VIEW COMPARISON:  CXR 11/03/21 FINDINGS: No pleural effusion. No pneumothorax. Low lung volumes. Left basilar atelectasis. Likely unchanged cardiac and mediastinal contours when accounting for differences in lung volumes. Status post median sternotomy. No focal airspace opacity. No radiographically apparent displaced rib fractures. Visualized upper abdomen is unremarkable. Surgical clips in the right upper quadrant IMPRESSION: Low lung volumes likely left basilar  atelectasis. Electronically Signed   By: Lorenza Cambridge M.D.   On: 04/30/2022 11:58    Assessment/Plan 1. Essential hypertension -Blood pressure stable Benicar was discontinued during hospitalization -Continue on Metoprolol and amlodipine - Ambulatory referral to Home Health - CBC with Differential/Platelet; Future - Basic metabolic panel; Future  2. Mild intermittent asthma without complication Symptoms stable -Continue on albuterol and Pulmicort  3. History of CVA (cerebrovascular accident) Left-sided weakness -Continue on atorvastatin and Plavix -Home health speech therapy - Ambulatory referral to Home Health  4. 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 Unsteady gait Home health physical therapy and Occupational Therapy as above - Ambulatory referral to Home Health  6. Gastroesophageal reflux disease without esophagitis Symptoms stable Continue on Protonix  7. Anxiety and depression Mood stable Continue on escitalopram and lorazepam  8. Hyperkalemia Declined blood work to be done today would like to return next week for blood work due to painful IV sites. - Basic metabolic panel; Future  Family/ staff Communication: Reviewed plan of care with patient and husband verbalized understanding  Labs/tests ordered: Recommended checking CBC with differential and BMP but patient declined states does not think lab will get elevated today due to dehydration and soreness from being prick to the hospital.  Would like to wait until next week for the sites to heal.  Next Appointment: Return for fasting labs in one week.   Caesar Bookman, NP

## 2022-05-09 ENCOUNTER — Telehealth: Payer: Self-pay

## 2022-05-09 NOTE — Telephone Encounter (Signed)
Tabitha with [facility] called stating that they did not pick up patient because she is bed bound and would require a higher level of care (I.e 24 hour caregiver) before accepting her.  Message routed to Marlowe Sax, NP

## 2022-05-09 NOTE — Telephone Encounter (Signed)
Please send referral to another [facility].

## 2022-05-14 DIAGNOSIS — E119 Type 2 diabetes mellitus without complications: Secondary | ICD-10-CM | POA: Diagnosis not present

## 2022-05-14 DIAGNOSIS — R3981 Functional urinary incontinence: Secondary | ICD-10-CM | POA: Diagnosis not present

## 2022-05-15 ENCOUNTER — Other Ambulatory Visit: Payer: Self-pay

## 2022-05-15 NOTE — Telephone Encounter (Signed)
Lori [name] with [facility] called to request refills on behalf of the patient for Amlodipine, Lantus Solostar, and Hydroxyzine.  I informed Lori [name] that hydroxyzine is not on patients active medication list and patient would need to call the office to discuss.  I also informed Lori [name] that I will verify medication dose and instructions with patient and send approval as requested.  Left message on voicemail for patient to return call when available . Reason for call: I need to confirm dose and instructions for Lantus and Amlodipine   Awaiting return call

## 2022-05-16 NOTE — Telephone Encounter (Signed)
Left message on voicemail for patient to return call when available   

## 2022-05-17 ENCOUNTER — Other Ambulatory Visit: Payer: Medicare HMO

## 2022-05-17 DIAGNOSIS — I10 Essential (primary) hypertension: Secondary | ICD-10-CM

## 2022-05-17 DIAGNOSIS — E875 Hyperkalemia: Secondary | ICD-10-CM

## 2022-05-20 ENCOUNTER — Other Ambulatory Visit: Payer: Medicare HMO

## 2022-05-20 ENCOUNTER — Encounter: Payer: Medicare HMO | Admitting: Nurse Practitioner

## 2022-05-20 DIAGNOSIS — E1169 Type 2 diabetes mellitus with other specified complication: Secondary | ICD-10-CM | POA: Diagnosis not present

## 2022-05-20 DIAGNOSIS — E875 Hyperkalemia: Secondary | ICD-10-CM | POA: Diagnosis not present

## 2022-05-20 DIAGNOSIS — I10 Essential (primary) hypertension: Secondary | ICD-10-CM | POA: Diagnosis not present

## 2022-05-21 ENCOUNTER — Ambulatory Visit (INDEPENDENT_AMBULATORY_CARE_PROVIDER_SITE_OTHER): Payer: Medicare HMO | Admitting: Family

## 2022-05-21 ENCOUNTER — Telehealth: Payer: Self-pay

## 2022-05-21 ENCOUNTER — Encounter: Payer: Self-pay | Admitting: Family

## 2022-05-21 ENCOUNTER — Emergency Department (HOSPITAL_COMMUNITY)
Admission: EM | Admit: 2022-05-21 | Discharge: 2022-05-22 | Disposition: A | Discharge status: Home / Self Care | Payer: Medicare HMO | Attending: Emergency Medicine | Admitting: Emergency Medicine

## 2022-05-21 ENCOUNTER — Other Ambulatory Visit: Payer: Self-pay

## 2022-05-21 VITALS — BP 136/88 | HR 85 | Temp 97.9°F | Resp 14 | Wt 189.2 lb

## 2022-05-21 DIAGNOSIS — Z20822 Contact with and (suspected) exposure to covid-19: Secondary | ICD-10-CM | POA: Diagnosis not present

## 2022-05-21 DIAGNOSIS — Z794 Long term (current) use of insulin: Secondary | ICD-10-CM | POA: Insufficient documentation

## 2022-05-21 DIAGNOSIS — I10 Essential (primary) hypertension: Secondary | ICD-10-CM

## 2022-05-21 DIAGNOSIS — J45909 Unspecified asthma, uncomplicated: Secondary | ICD-10-CM | POA: Diagnosis not present

## 2022-05-21 DIAGNOSIS — J4520 Mild intermittent asthma, uncomplicated: Secondary | ICD-10-CM | POA: Diagnosis not present

## 2022-05-21 DIAGNOSIS — G4489 Other headache syndrome: Secondary | ICD-10-CM | POA: Diagnosis not present

## 2022-05-21 DIAGNOSIS — M436 Torticollis: Secondary | ICD-10-CM | POA: Insufficient documentation

## 2022-05-21 DIAGNOSIS — Z7951 Long term (current) use of inhaled steroids: Secondary | ICD-10-CM | POA: Insufficient documentation

## 2022-05-21 DIAGNOSIS — R519 Headache, unspecified: Secondary | ICD-10-CM | POA: Diagnosis not present

## 2022-05-21 DIAGNOSIS — E1169 Type 2 diabetes mellitus with other specified complication: Secondary | ICD-10-CM

## 2022-05-21 DIAGNOSIS — N179 Acute kidney failure, unspecified: Secondary | ICD-10-CM | POA: Diagnosis not present

## 2022-05-21 DIAGNOSIS — M542 Cervicalgia: Secondary | ICD-10-CM | POA: Diagnosis not present

## 2022-05-21 DIAGNOSIS — R051 Acute cough: Secondary | ICD-10-CM

## 2022-05-21 DIAGNOSIS — J45901 Unspecified asthma with (acute) exacerbation: Secondary | ICD-10-CM | POA: Diagnosis not present

## 2022-05-21 DIAGNOSIS — R0602 Shortness of breath: Secondary | ICD-10-CM | POA: Diagnosis not present

## 2022-05-21 DIAGNOSIS — I6381 Other cerebral infarction due to occlusion or stenosis of small artery: Secondary | ICD-10-CM | POA: Diagnosis not present

## 2022-05-21 DIAGNOSIS — E785 Hyperlipidemia, unspecified: Secondary | ICD-10-CM | POA: Diagnosis not present

## 2022-05-21 MED ORDER — INSULIN PEN NEEDLE 32G X 4 MM MISC: 1.0000 "application " | Freq: Four times a day (QID) | 0 refills | Status: DC

## 2022-05-21 MED ORDER — ALBUTEROL SULFATE (2.5 MG/3ML) 0.083% IN NEBU: 2.5000 mg | INHALATION_SOLUTION | Freq: Four times a day (QID) | RESPIRATORY_TRACT | 12 refills | Status: DC | PRN

## 2022-05-21 MED ORDER — AMLODIPINE BESYLATE 10 MG PO TABS: 10.0000 mg | ORAL_TABLET | Freq: Every day | ORAL | 0 refills | Status: DC

## 2022-05-21 MED ORDER — BUDESONIDE 0.25 MG/2ML IN SUSP: 0.2500 mg | Freq: Two times a day (BID) | RESPIRATORY_TRACT | 12 refills | Status: DC

## 2022-05-21 MED ORDER — INSULIN ASPART 100 UNIT/ML FLEXPEN: 15.0000 [IU] | PEN_INJECTOR | Freq: Three times a day (TID) | SUBCUTANEOUS | 1 refills | Status: DC

## 2022-05-21 MED ORDER — GUAIFENESIN ER 600 MG PO TB12: 600.0000 mg | ORAL_TABLET | Freq: Two times a day (BID) | ORAL | 0 refills | Status: DC

## 2022-05-21 NOTE — Telephone Encounter (Signed)
Prior Authorization through Cover My Meds     Awaiting response from insurance company

## 2022-05-21 NOTE — ED Triage Notes (Signed)
Pt states sob, neck stiffness, and headache. Pt is bedbound from a stroke w/left sided deficits. Stroke was in 2022. Lungs clear

## 2022-05-21 NOTE — Progress Notes (Signed)
err

## 2022-05-21 NOTE — Patient Instructions (Addendum)
1.) STOP at check out to schedule an annual wellness visit, in -person or through video (must have access to smart phone or computer).    2.) Visit your local pharmacy to get your covid and tetanus vaccines updated through your pharmacy benefits.

## 2022-05-21 NOTE — Progress Notes (Signed)
Provider: Richarda Blade FNP-C  Kennis Wissmann, Donalee Citrin, NP  Patient Care Team: Brek Reece, Donalee Citrin, NP as PCP - General (Family Medicine) Orbie Pyo, MD as PCP - Cardiology (Cardiology)  Extended Emergency Contact Information Primary Emergency Contact: [name],[name] Mobile Phone: [phone] Relation: Spouse Preferred language: English Interpreter needed? No Secondary Emergency Contact: [name],[name] Home Phone: [phone] Mobile Phone: [phone] Relation: Daughter Mother: Blenda Peals Phone: [phone]  Code Status:  Full Code  Goals of care: Advanced Directive information    05/03/2022    3:41 AM  Advanced Directives  Does Patient Have a Medical Advance Directive? No  Would patient like information on creating a medical advance directive? No - Patient declined     Chief Complaint  Patient presents with   Acute Visit    Patient c/o shortness of breath. Foot exam today. Lost nebulizer in the move. Hard for her to finish sentence due to loss of breath. Has coughing fits for a while now-not productive. Feels like her heart is racing a lot.    Shortness of Breath    X couple weeks, staying the same. Nose is congested    HPI:  Pt is a 52 y.o. female seen today for an acute visit for evaluation of shortness of breath.States lost her nebulizer when she moved to her current apartment.Has not been using albuterol and Pulmicort solution via Nebulizer. She denies any fever or chills.   Past Medical History:  Diagnosis Date   CAD (coronary artery disease)    Depression    Diabetes mellitus without complication    History of CT scan    History of mammogram    History of MRI    Hypertension    Pheochromocytoma    Pheochromocytoma    Stroke    Thyroid disease    Past Surgical History:  Procedure Laterality Date   ABDOMINAL HYSTERECTOMY     CESAREAN SECTION     3   CORONARY ARTERY BYPASS GRAFT     Right adrenal gland removal for pheochromocytoma  Right     Allergies  Allergen Reactions   Contrast Media [Iodinated Contrast Media] Anaphylaxis and Swelling   Penicillins Swelling    Mouth swells up and eyes swollen shut   Sumatriptan Anaphylaxis   Aspirin Swelling   Ciprofloxacin     Feel nausea/vomitting   Morphine Swelling    Outpatient Encounter Medications as of 05/21/2022  Medication Sig   albuterol (PROVENTIL) (2.5 MG/3ML) 0.083% nebulizer solution Take 3 mLs (2.5 mg total) by nebulization every 6 (six) hours as needed for wheezing or shortness of breath.   albuterol (VENTOLIN HFA) 108 (90 Base) MCG/ACT inhaler Inhale 2 puffs into the lungs every 6 (six) hours as needed for wheezing or shortness of breath.   atorvastatin (LIPITOR) 80 MG tablet Take 80 mg by mouth daily.   baclofen (LIORESAL) 10 MG tablet Take 0.5 tablets (5 mg total) by mouth 3 (three) times daily. X 1 week, then 10 mg TID-for spasticity- can increase  the dose if you call me to discuss   blood glucose meter kit and supplies KIT Dispense based on patient and insurance preference. Use up to four times daily as directed.   budesonide (PULMICORT) 0.25 MG/2ML nebulizer solution Take 2 mLs (0.25 mg total) by nebulization 2 (two) times daily.   clopidogrel (PLAVIX) 75 MG tablet Take 75 mg by mouth daily.   Continuous Blood Gluc Receiver (DEXCOM G5 RECEIVER KIT) DEVI 1 Device by Does not apply route 4 (four)  times daily.   diclofenac Sodium (VOLTAREN) 1 % GEL Apply 4 g topically 4 (four) times daily.   escitalopram (LEXAPRO) 20 MG tablet Take 1 tablet (20 mg total) by mouth every morning.   ezetimibe (ZETIA) 10 MG tablet Take 1 tablet (10 mg total) by mouth daily.   gabapentin (NEURONTIN) 100 MG capsule TAKE ONE CAPSULE BY MOUTH THREE TIMES DAILY FOR NEUROPATHY   guaiFENesin (MUCINEX) 600 MG 12 hr tablet Take 1 tablet (600 mg total) by mouth 2 (two) times daily.   insulin aspart (NOVOLOG) 100 UNIT/ML FlexPen Inject 7 Units into the skin 3 (three) times daily with  meals. (Patient taking differently: Inject 15 Units into the skin 3 (three) times daily with meals.)   insulin glargine (LANTUS) 100 UNIT/ML Solostar Pen Inject 38 Units into the skin at bedtime.   Insulin Pen Needle 32G X 4 MM MISC Use to inject insulin 4 times daily as directed.   LORazepam (ATIVAN) 0.5 MG tablet Take 1 tablet (0.5 mg total) by mouth at bedtime.   MELATONIN GUMMIES PO Take 9 mg by mouth at bedtime as needed (for sleep).   methimazole (TAPAZOLE) 5 MG tablet TAKE 1 TABLET BY MOUTH EVERY MORNING   metoprolol tartrate (LOPRESSOR) 100 MG tablet TAKE 1 TABLET BY MOUTH TWICE DAILY (BREAKFAST, BEDTIME)   nystatin cream (MYCOSTATIN) Apply 1 Application topically 2 (two) times daily. Affected areas on breast fold and groin and sacral areas   pantoprazole (PROTONIX) 40 MG tablet TAKE 1 TABLET BY MOUTH EVERY MORNING   polyethylene glycol powder (GLYCOLAX/MIRALAX) 17 GM/SCOOP powder Take 17 g by mouth daily. Hold for loose stool   senna (SENOKOT) 8.6 MG tablet Take 8.6 mg by mouth daily.   tamsulosin (FLOMAX) 0.4 MG CAPS capsule TAKE 1 CAPSULE BY MOUTH AT BEDTIME   topiramate (TOPAMAX) 25 MG tablet TAKE 1 TABLET BY MOUTH TWICE DAILY FOR NEUROPATHY   zinc oxide (BALMEX) 11.3 % CREA cream Apply 1 Application topically 2 (two) times daily.   amLODipine (NORVASC) 10 MG tablet Take 1 tablet (10 mg total) by mouth daily. (Patient taking differently: Take 5 mg by mouth daily.)   No facility-administered encounter medications on file as of 05/21/2022.    Review of Systems  Constitutional:  Negative for appetite change, chills, fatigue, fever and unexpected weight change.  HENT:  Negative for congestion, dental problem, ear discharge, ear pain, facial swelling, hearing loss, nosebleeds, postnasal drip, rhinorrhea, sinus pressure, sinus pain, sneezing, sore throat, tinnitus and trouble swallowing.   Eyes:  Negative for pain, discharge, redness, itching and visual disturbance.  Respiratory:   Negative for cough, chest tightness, shortness of breath and wheezing.   Cardiovascular:  Negative for chest pain, palpitations and leg swelling.  Gastrointestinal:  Negative for abdominal distention, abdominal pain, constipation, diarrhea, nausea and vomiting.  Musculoskeletal:  Positive for gait problem. Negative for arthralgias, back pain, joint swelling and myalgias.  Skin:  Negative for color change, pallor, rash and wound.    There is no immunization history on file for this patient. Pertinent  Health Maintenance Due  Topic Date Due   FOOT EXAM  Never done   OPHTHALMOLOGY EXAM  Never done   COLONOSCOPY (Pts 45-80yrs Insurance coverage will need to be confirmed)  Never done   HEMOGLOBIN A1C  04/23/2022   INFLUENZA VACCINE  09/05/2022   PAP SMEAR-Modifier  Discontinued      01/15/2022    1:25 PM 01/23/2022    1:54 PM 03/05/2022    2:19  PM 03/29/2022   12:55 PM 04/08/2022   12:50 PM  Fall Risk  Falls in the past year? 1 0 1 0 1  Was there an injury with Fall? 1 0 1 0 0  Fall Risk Category Calculator 3 0 3 0 2  Fall Risk Category (Retired) High Low     (RETIRED) Patient Fall Risk Level Moderate fall risk Moderate fall risk     Patient at Risk for Falls Due to History of fall(s);Impaired balance/gait;Impaired mobility History of fall(s);Impaired balance/gait;Impaired mobility History of fall(s);Impaired balance/gait;Impaired mobility    Fall risk Follow up Falls evaluation completed;Education provided;Falls prevention discussed Falls evaluation completed Falls evaluation completed;Education provided;Falls prevention discussed     Functional Status Survey:    Vitals:   05/21/22 1313  BP: 136/88  Pulse: 85  Resp: 14  Temp: 97.9 F (36.6 C)  TempSrc: Temporal  SpO2: 98%  Weight: 189 lb 3.2 oz (85.8 kg)   Body mass index is 34.61 kg/m. Physical Exam Vitals reviewed.  Constitutional:      General: She is not in acute distress.    Appearance: Normal appearance. She is  obese. She is not ill-appearing or diaphoretic.  HENT:     Head: Normocephalic.     Right Ear: Tympanic membrane, ear canal and external ear normal. There is no impacted cerumen.     Left Ear: Tympanic membrane, ear canal and external ear normal. There is no impacted cerumen.     Nose: Nose normal. No congestion or rhinorrhea.     Mouth/Throat:     Mouth: Mucous membranes are moist.     Pharynx: Oropharynx is clear. No oropharyngeal exudate or posterior oropharyngeal erythema.  Eyes:     General: No scleral icterus.       Right eye: No discharge.        Left eye: No discharge.     Extraocular Movements: Extraocular movements intact.     Conjunctiva/sclera: Conjunctivae normal.     Pupils: Pupils are equal, round, and reactive to light.  Neck:     Vascular: No carotid bruit.  Cardiovascular:     Rate and Rhythm: Normal rate and regular rhythm.     Pulses: Normal pulses.     Heart sounds: Normal heart sounds. No murmur heard.    No friction rub. No gallop.  Pulmonary:     Effort: Pulmonary effort is normal. No respiratory distress.     Breath sounds: Normal breath sounds. No wheezing, rhonchi or rales.  Chest:     Chest wall: No tenderness.  Abdominal:     General: Bowel sounds are normal. There is no distension.     Palpations: Abdomen is soft. There is no mass.     Tenderness: There is no abdominal tenderness. There is no right CVA tenderness, left CVA tenderness, guarding or rebound.  Musculoskeletal:        General: No swelling or tenderness. Normal range of motion.     Cervical back: Normal range of motion. No rigidity or tenderness.     Right lower leg: No edema.     Left lower leg: No edema.  Lymphadenopathy:     Cervical: No cervical adenopathy.  Skin:    General: Skin is warm and dry.     Coloration: Skin is not pale.     Findings: No bruising, erythema, lesion or rash.  Neurological:     Mental Status: She is alert and oriented to person, place, and time.  Cranial Nerves: No cranial nerve deficit.     Sensory: No sensory deficit.     Coordination: Coordination normal.     Gait: Gait normal.     Comments: Left hand contracture   Psychiatric:        Mood and Affect: Mood normal.        Speech: Speech normal.        Behavior: Behavior normal.        Thought Content: Thought content normal.        Judgment: Judgment normal.    Labs reviewed: Recent Labs    11/03/21 0811 11/04/21 0317 11/05/21 0259 11/06/21 0307 05/02/22 0101 05/03/22 0104 05/20/22 1053  NA  --  137 138   < > 131* 130* 134*  K  --  3.9 3.3*   < > 4.3 4.3 4.6  CL  --  112* 112*   < > 97* 97* 100  CO2  --  18* 20*   < > 27 22 21   GLUCOSE  --  233* 113*   < > 292* 335* 269*  BUN  --  18 20   < > 40* 31* 17  CREATININE  --  0.64 0.69   < > 1.71* 1.26* 0.80  CALCIUM  --  8.0* 8.3*   < > 9.1 9.0 9.5  MG 1.7 2.2 2.1  --   --   --   --    < > = values in this interval not displayed.   Recent Labs    03/01/22 0828 04/30/22 1128 05/01/22 0200  AST 16 13* 11*  ALT 13 17 13   ALKPHOS 117 110 89  BILITOT 0.4 0.7 0.8  PROT 6.3 7.5 6.6  ALBUMIN 3.9 3.8 3.2*   Recent Labs    11/06/21 0307 04/30/22 1245 04/30/22 1508 05/01/22 0200 05/03/22 0104 05/20/22 1053  WBC 8.6 6.7  --  8.9 7.3 7.1  NEUTROABS 5.0 4.4  --   --   --  4,288  HGB 13.0 13.6   < > 12.0 12.2 13.2  HCT 39.1 40.7   < > 34.9* 36.1 40.8  MCV 77.4* 77.5*  --  75.2* 76.0* 80.0  PLT 287 92*  --  370 355 364   < > = values in this interval not displayed.   Lab Results  Component Value Date   TSH 1.865 04/30/2022   Lab Results  Component Value Date   HGBA1C 10.5 (H) 10/23/2021   Lab Results  Component Value Date   CHOL 148 03/01/2022   HDL 44 03/01/2022   LDLCALC 79 03/01/2022   TRIG 143 03/01/2022   CHOLHDL 3.4 03/01/2022    Significant Diagnostic Results in last 30 days:  US RENAL  Result Date: 04/30/2022 CLINICAL DATA:  Acute kidney injury. EXAM: RENAL / URINARY TRACT ULTRASOUND  COMPLETE COMPARISON:  CT AP 07/10/2021 FINDINGS: Right Kidney: Renal measurements: 10.0 x 4.4 x 3.7 cm = volume: 85.5 mL. Echogenicity within normal limits. No mass or hydronephrosis visualized. Left Kidney: Renal measurements: 9.8 x 5.3 x 5.4 cm = volume: 145.8 mL. Echogenicity within normal limits. No mass or hydronephrosis visualized. Bladder: Appears normal for degree of bladder distention. Other: Exam detail is diminished secondary to patient body habitus. IMPRESSION: No acute abnormality.  No signs of obstructive uropathy. Electronically Signed   By: Signa Kell M.D.   On: 04/30/2022 15:43   CT Head Wo Contrast  Result Date: 04/30/2022 CLINICAL DATA:  Left-sided weakness, acute stroke suspected, previous  history of stroke with left-sided weakness EXAM: CT HEAD WITHOUT CONTRAST TECHNIQUE: Contiguous axial images were obtained from the base of the skull through the vertex without intravenous contrast. RADIATION DOSE REDUCTION: This exam was performed according to the departmental dose-optimization program which includes automated exposure control, adjustment of the mA and/or kV according to patient size and/or use of iterative reconstruction technique. COMPARISON:  11/03/2021 FINDINGS: Brain: No evidence of acute infarction, hemorrhage, hydrocephalus, extra-axial collection or mass lesion/mass effect. Periventricular and deep white matter hypodensity. Unchanged small bilateral lacunar infarctions of the basal ganglia (series 3, image 20). Unchanged encephalomalacia of the left frontal lobe (series 3, image 16) and left cerebellar hemisphere (series 3, image 26). Vascular: No hyperdense vessel or unexpected calcification. Skull: Normal. Negative for fracture or focal lesion. Sinuses/Orbits: No acute finding. Other: None. IMPRESSION: 1. No CT evidence of acute intracranial pathology. 2. Small-vessel white matter disease significantly advanced for patient age. 3. Unchanged small bilateral lacunar infarctions  of the basal ganglia. Unchanged encephalomalacia of the left frontal lobe and left cerebellar hemisphere. 4. Consider MRI to more sensitively evaluate for acute diffusion restricting infarction if clinically suspected. Electronically Signed   By: Jearld Lesch M.D.   On: 04/30/2022 13:00   DG Chest Portable 1 View  Result Date: 04/30/2022 CLINICAL DATA:  Hypoxia EXAM: PORTABLE CHEST 1 VIEW COMPARISON:  CXR 11/03/21 FINDINGS: No pleural effusion. No pneumothorax. Low lung volumes. Left basilar atelectasis. Likely unchanged cardiac and mediastinal contours when accounting for differences in lung volumes. Status post median sternotomy. No focal airspace opacity. No radiographically apparent displaced rib fractures. Visualized upper abdomen is unremarkable. Surgical clips in the right upper quadrant IMPRESSION: Low lung volumes likely left basilar atelectasis. Electronically Signed   By: Lorenza Cambridge M.D.   On: 04/30/2022 11:58    Assessment/Plan  1. Mild intermittent asthma without complication Reports shortness of breath at times since her nebulizer.will reorder Nebulizer.  - For home use only DME Nebulizer machine - albuterol (PROVENTIL) (2.5 MG/3ML) 0.083% nebulizer solution; Take 3 mLs (2.5 mg total) by nebulization every 6 (six) hours as needed for wheezing or shortness of breath.  Dispense: 75 mL; Refill: 12 - budesonide (PULMICORT) 0.25 MG/2ML nebulizer solution; Take 2 mLs (0.25 mg total) by nebulization 2 (two) times daily.  Dispense: 60 mL; Refill: 12  2. Type 2 diabetes mellitus with hyperlipidemia Lab Results  Component Value Date   HGBA1C 10.5 (H) 10/23/2021  - continue on Lantus and Novolog  - Microalbumin/Creatinine Ratio, Urine - insulin aspart (NOVOLOG) 100 UNIT/ML FlexPen; Inject 15 Units into the skin 3 (three) times daily with meals.  Dispense: 9 mL; Refill: 1  3. Essential hypertension B/p well controlled  - continue on amlodipine,metoprolol   - amLODipine (NORVASC) 10 MG  tablet; Take 1 tablet (10 mg total) by mouth daily.  Dispense: 30 tablet; Refill: 0  4. Acute cough Bilateral lung clear to ausculation  - suspect cough due to asthma.  - guaiFENesin (MUCINEX) 600 MG 12 hr tablet; Take 1 tablet (600 mg total) by mouth 2 (two) times daily.  Dispense: 30 tablet; Refill: 0  Family/ staff Communication: Reviewed plan of care with patient verbalized understanding   Labs/tests ordered:  - Microalbumin/Creatinine Ratio, Urine  Next Appointment: Return in about 6 months (around 11/20/2022) for medical mangement of chronic issues.Caesar Bookman, NP

## 2022-05-22 ENCOUNTER — Other Ambulatory Visit: Payer: Self-pay

## 2022-05-22 ENCOUNTER — Emergency Department (HOSPITAL_COMMUNITY): Payer: Medicare HMO

## 2022-05-22 DIAGNOSIS — Z7401 Bed confinement status: Secondary | ICD-10-CM | POA: Diagnosis not present

## 2022-05-22 DIAGNOSIS — G8190 Hemiplegia, unspecified affecting unspecified side: Secondary | ICD-10-CM | POA: Diagnosis not present

## 2022-05-22 DIAGNOSIS — J45909 Unspecified asthma, uncomplicated: Secondary | ICD-10-CM | POA: Diagnosis not present

## 2022-05-22 DIAGNOSIS — I6381 Other cerebral infarction due to occlusion or stenosis of small artery: Secondary | ICD-10-CM | POA: Diagnosis not present

## 2022-05-22 DIAGNOSIS — R519 Headache, unspecified: Secondary | ICD-10-CM | POA: Diagnosis not present

## 2022-05-22 DIAGNOSIS — M542 Cervicalgia: Secondary | ICD-10-CM | POA: Diagnosis not present

## 2022-05-22 DIAGNOSIS — R0602 Shortness of breath: Secondary | ICD-10-CM | POA: Diagnosis not present

## 2022-05-22 LAB — CBC WITH DIFFERENTIAL/PLATELET
Abs Immature Granulocytes: 0.02 10*3/uL (ref 0.00–0.07)
Basophils Absolute: 0.1 10*3/uL (ref 0.0–0.1)
Basophils Relative: 1 %
Eosinophils Absolute: 0.3 10*3/uL (ref 0.0–0.5)
Eosinophils Relative: 3 %
HCT: 38.1 % (ref 36.0–46.0)
Hemoglobin: 12.8 g/dL (ref 12.0–15.0)
Immature Granulocytes: 0 %
Lymphocytes Relative: 42 %
Lymphs Abs: 3.6 10*3/uL (ref 0.7–4.0)
MCH: 26.1 pg (ref 26.0–34.0)
MCHC: 33.6 g/dL (ref 30.0–36.0)
MCV: 77.6 fL — ABNORMAL LOW (ref 80.0–100.0)
Monocytes Absolute: 0.6 10*3/uL (ref 0.1–1.0)
Monocytes Relative: 7 %
Neutro Abs: 4.2 10*3/uL (ref 1.7–7.7)
Neutrophils Relative %: 47 %
Platelets: 346 10*3/uL (ref 150–400)
RBC: 4.91 MIL/uL (ref 3.87–5.11)
RDW: 15.4 % (ref 11.5–15.5)
WBC: 8.8 10*3/uL (ref 4.0–10.5)
nRBC: 0 % (ref 0.0–0.2)

## 2022-05-22 LAB — TROPONIN I (HIGH SENSITIVITY): Troponin I (High Sensitivity): 4 ng/L (ref <18)

## 2022-05-22 LAB — COMPREHENSIVE METABOLIC PANEL
ALT: 15 U/L (ref 0–44)
AST: 15 U/L (ref 15–41)
Albumin: 3.3 g/dL — ABNORMAL LOW (ref 3.5–5.0)
Alkaline Phosphatase: 90 U/L (ref 38–126)
Anion gap: 11 (ref 5–15)
BUN: 14 mg/dL (ref 6–20)
CO2: 20 mmol/L — ABNORMAL LOW (ref 22–32)
Calcium: 8.9 mg/dL (ref 8.9–10.3)
Chloride: 103 mmol/L (ref 98–111)
Creatinine, Ser: 0.82 mg/dL (ref 0.44–1.00)
GFR, Estimated: >60 mL/min (ref >60)
Glucose, Bld: 251 mg/dL — ABNORMAL HIGH (ref 70–99)
Potassium: 3.8 mmol/L (ref 3.5–5.1)
Sodium: 134 mmol/L — ABNORMAL LOW (ref 135–145)
Total Bilirubin: 0.4 mg/dL (ref 0.3–1.2)
Total Protein: 7 g/dL (ref 6.5–8.1)

## 2022-05-22 LAB — SARS CORONAVIRUS 2 BY RT PCR: SARS Coronavirus 2 by RT PCR: NEGATIVE

## 2022-05-22 LAB — TEST AUTHORIZATION

## 2022-05-22 LAB — BRAIN NATRIURETIC PEPTIDE: B Natriuretic Peptide: 49.6 pg/mL (ref 0.0–100.0)

## 2022-05-22 MED ORDER — BACLOFEN 10 MG PO TABS: 10.0000 mg | ORAL_TABLET | Freq: Three times a day (TID) | ORAL | 1 refills | Status: DC

## 2022-05-22 MED ORDER — CYCLOBENZAPRINE HCL 5 MG PO TABS: 5.0000 mg | ORAL_TABLET | Freq: Three times a day (TID) | ORAL | 1 refills | Status: DC | PRN

## 2022-05-22 MED ORDER — HYDROCODONE-ACETAMINOPHEN 5-325 MG PO TABS: 1.0000 | ORAL_TABLET | Freq: Four times a day (QID) | ORAL | 0 refills | Status: DC | PRN

## 2022-05-22 MED ORDER — FENTANYL CITRATE PF 50 MCG/ML IJ SOSY
50.0000 ug | PREFILLED_SYRINGE | Freq: Once | INTRAMUSCULAR | Status: AC
  Administered 2022-05-22: 50 ug via INTRAVENOUS
  Filled 2022-05-22: qty 1

## 2022-05-22 NOTE — ED Provider Notes (Signed)
[facility] EMERGENCY DEPARTMENT AT [facility] Provider Note   CSN: [id number] Arrival date & time: 05/21/22  2336     History  Chief Complaint  Patient presents with   Shortness of Breath    Lori [name] is a 52 y.o. female.  Patient was to the emergency department with multiple complaints.  Patient is bedbound secondary to a stroke.  She reports that she noticed pain in both sides of her neck this morning.  Pain worsens with any movement of the neck.  No known injury.  No posterior neck pain.  She has not noticed any fevers.  Patient does report that she has been experiencing a headache as well.  No visual disturbance.  Patient also complaining of shortness of breath.  Patient reports that she saw her primary care doctor for this earlier today and a nebulizer machine has been ordered for her.  She has a history of chronic asthma, reportedly lost her nebulizer with a recent move.  She has had some cough and chest congestion.       Home Medications Prior to Admission medications   Medication Sig Start Date End Date Taking? Authorizing Provider  HYDROcodone-acetaminophen (NORCO/VICODIN) 5-325 MG tablet Take 1 tablet by mouth every 6 (six) hours as needed for moderate pain. 05/22/22  Yes Miloh Alcocer, Canary Brim, MD  albuterol (PROVENTIL) (2.5 MG/3ML) 0.083% nebulizer solution Take 3 mLs (2.5 mg total) by nebulization every 6 (six) hours as needed for wheezing or shortness of breath. 05/21/22   Ngetich, Dinah C, NP  albuterol (VENTOLIN HFA) 108 (90 Base) MCG/ACT inhaler Inhale 2 puffs into the lungs every 6 (six) hours as needed for wheezing or shortness of breath. 05/08/22   Ngetich, Dinah C, NP  amLODipine (NORVASC) 10 MG tablet Take 1 tablet (10 mg total) by mouth daily. 05/21/22 06/20/22  Ngetich, Dinah C, NP  atorvastatin (LIPITOR) 80 MG tablet Take 80 mg by mouth daily. 06/25/21   [provider]  baclofen (LIORESAL) 10 MG tablet Take 0.5 tablets (5 mg total) by  mouth 3 (three) times daily. X 1 week, then 10 mg TID-for spasticity- can increase  the dose if you call me to discuss 04/08/22   Genice Rouge, MD  blood glucose meter kit and supplies KIT Dispense based on patient and insurance preference. Use up to four times daily as directed. 03/16/21   Love, Evlyn Kanner, PA-C  budesonide (PULMICORT) 0.25 MG/2ML nebulizer solution Take 2 mLs (0.25 mg total) by nebulization 2 (two) times daily. 05/21/22   Ngetich, Dinah C, NP  clopidogrel (PLAVIX) 75 MG tablet Take 75 mg by mouth daily.    [provider]  Continuous Blood Gluc Receiver (DEXCOM G5 RECEIVER KIT) DEVI 1 Device by Does not apply route 4 (four) times daily. 10/22/21   Ngetich, Dinah C, NP  diclofenac Sodium (VOLTAREN) 1 % GEL Apply 4 g topically 4 (four) times daily. 03/15/21   Love, Evlyn Kanner, PA-C  escitalopram (LEXAPRO) 20 MG tablet Take 1 tablet (20 mg total) by mouth every morning. 01/30/22   Ngetich, Dinah C, NP  ezetimibe (ZETIA) 10 MG tablet Take 1 tablet (10 mg total) by mouth daily. 05/23/21   Micki Riley, MD  gabapentin (NEURONTIN) 100 MG capsule TAKE ONE CAPSULE BY MOUTH THREE TIMES DAILY FOR NEUROPATHY 12/24/21   Ngetich, Dinah C, NP  guaiFENesin (MUCINEX) 600 MG 12 hr tablet Take 1 tablet (600 mg total) by mouth 2 (two) times daily. 05/21/22   Ngetich, [employer]  C, NP  insulin aspart (NOVOLOG) 100 UNIT/ML FlexPen Inject 15 Units into the skin 3 (three) times daily with meals. 05/21/22   Ngetich, Dinah C, NP  insulin glargine (LANTUS) 100 UNIT/ML Solostar Pen Inject 38 Units into the skin at bedtime.    [provider]  Insulin Pen Needle 32G X 4 MM MISC Use to inject insulin 4 times daily as directed. 05/21/22   Ngetich, Dinah C, NP  LORazepam (ATIVAN) 0.5 MG tablet Take 1 tablet (0.5 mg total) by mouth at bedtime. 01/08/22   Ngetich, Dinah C, NP  MELATONIN GUMMIES PO Take 9 mg by mouth at bedtime as needed (for sleep).    [provider]  methimazole (TAPAZOLE) 5 MG tablet  TAKE 1 TABLET BY MOUTH EVERY MORNING 05/23/21   Ngetich, Dinah C, NP  metoprolol tartrate (LOPRESSOR) 100 MG tablet TAKE 1 TABLET BY MOUTH TWICE DAILY (BREAKFAST, BEDTIME) 05/23/21   Ngetich, Dinah C, NP  nystatin cream (MYCOSTATIN) Apply 1 Application topically 2 (two) times daily. Affected areas on breast fold and groin and sacral areas 07/31/21   Ngetich, Dinah C, NP  pantoprazole (PROTONIX) 40 MG tablet TAKE 1 TABLET BY MOUTH EVERY MORNING 05/06/22   Ngetich, Dinah C, NP  polyethylene glycol powder (GLYCOLAX/MIRALAX) 17 GM/SCOOP powder Take 17 g by mouth daily. Hold for loose stool 01/23/22   Ngetich, Dinah C, NP  senna (SENOKOT) 8.6 MG tablet Take 8.6 mg by mouth daily.    [provider]  tamsulosin (FLOMAX) 0.4 MG CAPS capsule TAKE 1 CAPSULE BY MOUTH AT BEDTIME 05/06/22   Ngetich, Dinah C, NP  topiramate (TOPAMAX) 25 MG tablet TAKE 1 TABLET BY MOUTH TWICE DAILY FOR NEUROPATHY 12/24/21   Ngetich, Dinah C, NP  zinc oxide (BALMEX) 11.3 % CREA cream Apply 1 Application topically 2 (two) times daily. 07/31/21   Ngetich, Dinah C, NP      Allergies    Contrast media [iodinated contrast media], Penicillins, Sumatriptan, Aspirin, Ciprofloxacin, and Morphine    Review of Systems   Review of Systems  Physical Exam Updated Vital Signs BP 122/85 (BP Location: Right Arm)   Pulse 78   Temp 98.1 F (36.7 C) (Oral)   Resp 20   Ht 5\' 2"  (1.575 m)   Wt 85.3 kg   SpO2 100%   BMI 34.39 kg/m  Physical Exam Vitals and nursing note reviewed.  Constitutional:      General: She is not in acute distress.    Appearance: She is well-developed.  HENT:     Head: Normocephalic and atraumatic.     Mouth/Throat:     Mouth: Mucous membranes are moist.  Eyes:     General: Vision grossly intact. Gaze aligned appropriately.     Extraocular Movements: Extraocular movements intact.     Conjunctiva/sclera: Conjunctivae normal.  Cardiovascular:     Rate and Rhythm: Normal rate and regular rhythm.      Pulses: Normal pulses.     Heart sounds: Normal heart sounds, S1 normal and S2 normal. No murmur heard.    No friction rub. No gallop.  Pulmonary:     Effort: Pulmonary effort is normal. No respiratory distress.     Breath sounds: Normal breath sounds.  Abdominal:     General: Bowel sounds are normal.     Palpations: Abdomen is soft.     Tenderness: There is no abdominal tenderness. There is no guarding or rebound.     Hernia: No hernia is present.  Musculoskeletal:  General: No swelling.     Cervical back: Neck supple. Pain with movement and muscular tenderness present. Decreased range of motion.     Right lower leg: No edema.     Left lower leg: No edema.  Skin:    General: Skin is warm and dry.     Capillary Refill: Capillary refill takes less than 2 seconds.     Findings: No ecchymosis, erythema, rash or wound.  Neurological:     General: No focal deficit present.     Mental Status: She is alert and oriented to person, place, and time.     GCS: GCS eye subscore is 4. GCS verbal subscore is 5. GCS motor subscore is 6.     Cranial Nerves: Cranial nerves 2-12 are intact.     Comments: Spastic paresis left extremities  Psychiatric:        Attention and Perception: Attention normal.        Mood and Affect: Mood normal.        Speech: Speech normal.        Behavior: Behavior normal.     ED Results / Procedures / Treatments   Labs (all labs ordered are listed, but only abnormal results are displayed) Labs Reviewed  CBC WITH DIFFERENTIAL/PLATELET - Abnormal; Notable for the following components:      Result Value   MCV 77.6 (*)    All other components within normal limits  COMPREHENSIVE METABOLIC PANEL - Abnormal; Notable for the following components:   Sodium 134 (*)    CO2 20 (*)    Glucose, Bld 251 (*)    Albumin 3.3 (*)    All other components within normal limits  SARS CORONAVIRUS 2 BY RT PCR  BRAIN NATRIURETIC PEPTIDE  TROPONIN I (HIGH SENSITIVITY)     EKG EKG Interpretation  Date/Time:  Wednesday May 22 2022 00:59:29 EDT Ventricular Rate:  73 PR Interval:  201 QRS Duration: 89 QT Interval:  418 QTC Calculation: 461 R Axis:   117 Text Interpretation: Right and left arm electrode reversal, interpretation assumes no reversal Sinus rhythm Probable left atrial enlargement Probable lateral infarct, age indeterminate No significant change since last tracing Confirmed by [name] [phone]) on 05/22/2022 1:21:28 AM  Radiology CT CERVICAL SPINE WO CONTRAST  Result Date: 05/22/2022 CLINICAL DATA:  52 year old female with previous stroke. Headache. Neck pain. Neck stiffness. EXAM: CT CERVICAL SPINE WITHOUT CONTRAST TECHNIQUE: Multidetector CT imaging of the cervical spine was performed without intravenous contrast. Multiplanar CT image reconstructions were also generated. RADIATION DOSE REDUCTION: This exam was performed according to the departmental dose-optimization program which includes automated exposure control, adjustment of the mA and/or kV according to patient size and/or use of iterative reconstruction technique. COMPARISON:  Head CT today.  CTA head and neck 01/09/2021. FINDINGS: Alignment: Stable cervical lordosis. Cervicothoracic junction alignment is within normal limits. Bilateral posterior element alignment is within normal limits. Skull base and vertebrae: Visualized skull base is intact. No atlanto-occipital dissociation. C1 and C2 appear intact and aligned. No acute osseous abnormality identified. Soft tissues and spinal canal: No prevertebral fluid or swelling. No visible canal hematoma. Widespread severe bilateral vertebral artery calcified atherosclerosis. Bilateral carotid calcified plaque, extensive on the right and bulky at the right carotid bifurcation. Negative visible other noncontrast neck soft tissues. Disc levels: Mild for age cervical spine degeneration. No CT evidence of spinal stenosis. 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 Lung apices are clear, incidental right upper lobe azygous lobe and fissure (normal variant). IMPRESSION: 1. No acute osseous abnormality in the cervical spine. Mild for age cervical spine degeneration. No CT evidence of spinal stenosis. 2. Severe calcified atherosclerosis in the neck, especially affecting both vertebral arteries, right carotid. Electronically Signed   By: Odessa Fleming M.D.   On: 05/22/2022 05:16   CT Head Wo Contrast  Result Date: 05/22/2022 CLINICAL DATA:  52 year old female with previous stroke. Headache. Neck pain. Neck stiffness. EXAM: CT HEAD WITHOUT CONTRAST TECHNIQUE: Contiguous axial images were obtained from the base of the skull through the vertex without intravenous contrast. RADIATION DOSE REDUCTION: This exam was performed according to the departmental dose-optimization program which includes automated exposure control, adjustment of the mA and/or kV according to patient size and/or use of iterative reconstruction technique. COMPARISON:  Brain MRI 08/29/2021.  Head CT 04/30/2022. 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 Superimposed advanced chronic small vessel disease throughout the bilateral deep gray nuclei with scattered chronic bilateral lacunar infarcts. Patchy and confluent cerebral white matter disease. Areas of focal corpus callosum encephalomalacia better demonstrated by MRI. Small area of chronic cortical encephalomalacia in the left middle frontal gyrus and frontal operculum from prior infarct. Chronic cerebellar and brainstem volume loss, which by MRI seems only in part related to right brainstem Wallerian degeneration. Stable cerebral volume. No midline shift, ventriculomegaly, mass effect, evidence of mass lesion, intracranial hemorrhage or evidence of cortically  based acute infarction. Vascular: Extensive Calcified atherosclerosis at the skull base. Skull: No acute osseous abnormality identified. Sinuses/Orbits: Visualized paranasal sinuses and mastoids are stable and well aerated. Other: No acute orbit or scalp soft tissue finding. IMPRESSION: 1. No acute intracranial abnormality. 2. Chronic severe ischemic disease, and chronic partially nonspecific brainstem and cerebellar volume loss Cerebral Atrophy (ICD10-G31.9). Electronically Signed   By: Odessa Fleming M.D.   On: 05/22/2022 05:03   DG Chest Port 1 View  Result Date: 05/22/2022 CLINICAL DATA:  Shortness of breath EXAM: PORTABLE CHEST 1 VIEW COMPARISON:  Radiograph 04/30/2022 FINDINGS: Sternotomy and CABG. Stable cardiomediastinal silhouette. Normal variant azygous fissure. No focal consolidation, pleural effusion, or pneumothorax. No displaced rib fractures. IMPRESSION: No active disease. Electronically Signed   By: Minerva Fester M.D.   On: 05/22/2022 00:27    Procedures Procedures    Medications Ordered in ED Medications  fentaNYL (SUBLIMAZE) injection 50 mcg (50 mcg Intravenous Given 05/22/22 0354)    ED Course/ Medical Decision Making/ A&P                             Medical Decision Making Amount and/or Complexity of Data Reviewed External Data Reviewed: labs, radiology, ECG and notes. Labs: ordered. Decision-making details documented in ED Course. Radiology: ordered and independent interpretation performed. Decision-making details documented in ED Course. ECG/medicine tests: ordered and independent interpretation performed. Decision-making details documented in ED Course.  Risk Prescription drug management.   Differential Diagnosis considered includes, but not limited to: Asthma exacerbation; Bronchitis; Pneumonia; CHF; ACS; PE   Cervical radiculopathy; cervical strain; torticollis; meningitis  Presents with multiple complaints.  Patient is here complaining of neck pain and  stiffness.  She reports that she woke up like this yesterday.  Patient does have some rigidity and tenderness of the paraspinal muscles consistent with spasm.  She has difficulty moving from side-to-side because of pain.  No radicular symptoms appreciated on exam.  She does have baseline hemiparesis on the left from prior stroke.  Patient complaining of a headache as well.  This developed after the neck pain.  This is likely pain secondary to the spasms of her neck muscles.  Head CT without acute abnormality.  Old stroke seen, no new findings.  CT cervical spine without acute findings.  Patient administered fentanyl and is now feeling better.  Pain is reduced and she is moving her head more fluently.  Continue outpatient management with analgesia, muscle relaxer.  Patient also complaining of shortness of breath.  She reports a history of asthma but does not currently have albuterol because she lost her nebulizer machine.  Her doctor, however, has ordered 1 today and she will be able to get albuterol treatments in the future.  She does not have any active wheezing currently.  Chest x-ray without evidence of pneumonia.  Blood work was otherwise unremarkable.  Vital signs are normal, normotensive, no fever, no hypoxia.  Patient has had thorough workup and does not require hospitalization.        Final Clinical Impression(s) / ED Diagnoses Final diagnoses:  Torticollis  Uncomplicated asthma, unspecified asthma severity, unspecified whether persistent    Rx / DC Orders ED Discharge Orders          Ordered    HYDROcodone-acetaminophen (NORCO/VICODIN) 5-325 MG tablet  Every 6 hours PRN        05/22/22 0614              [name], MD 05/22/22 [phone]

## 2022-05-22 NOTE — ED Notes (Signed)
[facility] called, no ETA 

## 2022-05-22 NOTE — ED Notes (Signed)
Report received assumed care at this time pt resting on stretcher awaiting transportation home

## 2022-05-22 NOTE — Telephone Encounter (Signed)
Patient has requested  refills of Baclofen 10 MG & Cyclobenzaprine to be sent [facility]. Marland Kitchen

## 2022-05-23 ENCOUNTER — Telehealth: Payer: Self-pay

## 2022-05-23 ENCOUNTER — Other Ambulatory Visit: Payer: Self-pay

## 2022-05-23 DIAGNOSIS — E1169 Type 2 diabetes mellitus with other specified complication: Secondary | ICD-10-CM

## 2022-05-23 LAB — BASIC METABOLIC PANEL
BUN: 17 mg/dL (ref 7–25)
CO2: 21 mmol/L (ref 20–32)
Calcium: 9.5 mg/dL (ref 8.6–10.2)
Chloride: 100 mmol/L (ref 98–110)
Creat: 0.8 mg/dL (ref 0.50–0.99)
Glucose, Bld: 269 mg/dL — ABNORMAL HIGH (ref 65–99)
Potassium: 4.6 mmol/L (ref 3.5–5.3)
Sodium: 134 mmol/L — ABNORMAL LOW (ref 135–146)

## 2022-05-23 LAB — CBC WITH DIFFERENTIAL/PLATELET
Absolute Monocytes: 412 cells/uL (ref 200–950)
Basophils Absolute: 50 cells/uL (ref 0–200)
Basophils Relative: 0.7 %
Eosinophils Absolute: 149 cells/uL (ref 15–500)
Eosinophils Relative: 2.1 %
HCT: 40.8 % (ref 35.0–45.0)
Hemoglobin: 13.2 g/dL (ref 11.7–15.5)
Lymphs Abs: 2201 cells/uL (ref 850–3900)
MCH: 25.9 pg — ABNORMAL LOW (ref 27.0–33.0)
MCHC: 32.4 g/dL (ref 32.0–36.0)
MCV: 80 fL (ref 80.0–100.0)
MPV: 10.1 fL (ref 7.5–12.5)
Monocytes Relative: 5.8 %
Neutro Abs: 4288 cells/uL (ref 1500–7800)
Neutrophils Relative %: 60.4 %
Platelets: 364 10*3/uL (ref 140–400)
RBC: 5.1 10*6/uL (ref 3.80–5.10)
RDW: 15.6 % — ABNORMAL HIGH (ref 11.0–15.0)
Total Lymphocyte: 31 %
WBC: 7.1 10*3/uL (ref 3.8–10.8)

## 2022-05-23 LAB — TEST AUTHORIZATION

## 2022-05-23 LAB — HEMOGLOBIN A1C W/OUT EAG: Hgb A1c MFr Bld: 11.3 % of total Hgb — ABNORMAL HIGH (ref <5.7)

## 2022-05-23 MED ORDER — FREESTYLE LIBRE 3 SENSOR MISC
1.0000 | Freq: Every day | 12 refills | Status: DC
Start: 2022-05-23 — End: 2022-06-14

## 2022-05-23 MED ORDER — FREESTYLE LIBRE 3 READER DEVI
1.0000 | Freq: Every day | 0 refills | Status: DC
Start: 2022-05-23 — End: 2022-06-14

## 2022-05-23 NOTE — Telephone Encounter (Signed)
reply from covermymeds

## 2022-05-23 NOTE — Addendum Note (Signed)
Addended by: Maurice Small on: 05/23/2022 03:18 PM   Modules accepted: Orders

## 2022-05-23 NOTE — Telephone Encounter (Signed)
Prior authorization request received from [facility] pharmacy for Budesonide 0.25mg /59ml suspension. PA started through covermymeds and waiting on reply

## 2022-05-23 NOTE — Transitions of Care (Post Inpatient/ED Visit) (Signed)
   05/23/2022  Name: Lori [name] MRN: [id number] DOB: [birth year]  Today's TOC FU Call Status: Today's TOC FU Call Status:: Successful TOC FU Call Competed TOC FU Call Complete Date: 05/23/22  Transition Care Management Follow-up Telephone Call Date of Discharge: 05/21/22 Discharge Facility: [facility]) Type of Discharge: Emergency Department Primary Inpatient Discharge Diagnosis:: Torticollis Reason for ED Visit: Other: (Torticollis) How have you been since you were released from the hospital?: Same Any questions or concerns?: Yes Patient Questions/Concerns:: Patient will save questions for appointment  Items Reviewed: Did you receive and understand the discharge instructions provided?: Yes Medications obtained and verified?: Yes (Medications Reviewed) Any new allergies since your discharge?: No Dietary orders reviewed?: No Do you have support at home?: Yes People in Home: spouse Name of Support/Comfort Primary Source: [facility] and Equipment/Supplies: Were Home Health Services Ordered?: No Any new equipment or medical supplies ordered?: No  Functional Questionnaire: Do you need assistance with bathing/showering or dressing?: Yes Do you need assistance with meal preparation?: Yes Do you need assistance with eating?: No Do you have difficulty maintaining continence: No Do you need assistance with getting out of bed/getting out of a chair/moving?: Yes Do you have difficulty managing or taking your medications?: No  Follow up appointments reviewed: PCP Follow-up appointment confirmed?: Yes Date of PCP follow-up appointment?: 05/31/22 Follow-up Provider: Ella Bodo, NP [facility] Follow-up appointment confirmed?: No Do you need transportation to your follow-up appointment?: No Do you understand care options if your condition(s) worsen?: Yes-patient verbalized understanding    SIGNATURE: Ermalinda Joubert.D/RMA

## 2022-05-28 ENCOUNTER — Telehealth: Payer: Self-pay | Admitting: Family

## 2022-05-28 NOTE — Telephone Encounter (Signed)
Patient called in to inform Dinah that [facility] need a new referral since she will be going back to [facility] insurance on May first so that they can start seeing her again. Please advise

## 2022-05-29 ENCOUNTER — Other Ambulatory Visit: Payer: Self-pay

## 2022-05-29 ENCOUNTER — Ambulatory Visit (INDEPENDENT_AMBULATORY_CARE_PROVIDER_SITE_OTHER): Payer: Medicare HMO | Admitting: Adult Health

## 2022-05-29 ENCOUNTER — Encounter: Payer: Self-pay | Admitting: Adult Health

## 2022-05-29 VITALS — BP 132/88 | HR 94 | Temp 97.8°F | Resp 18 | Ht 62.0 in

## 2022-05-29 DIAGNOSIS — M24542 Contracture, left hand: Secondary | ICD-10-CM

## 2022-05-29 DIAGNOSIS — M436 Torticollis: Secondary | ICD-10-CM

## 2022-05-29 DIAGNOSIS — E0590 Thyrotoxicosis, unspecified without thyrotoxic crisis or storm: Secondary | ICD-10-CM | POA: Diagnosis not present

## 2022-05-29 DIAGNOSIS — R21 Rash and other nonspecific skin eruption: Secondary | ICD-10-CM | POA: Diagnosis not present

## 2022-05-29 DIAGNOSIS — K219 Gastro-esophageal reflux disease without esophagitis: Secondary | ICD-10-CM

## 2022-05-29 DIAGNOSIS — E1169 Type 2 diabetes mellitus with other specified complication: Secondary | ICD-10-CM

## 2022-05-29 DIAGNOSIS — Z8673 Personal history of transient ischemic attack (TIA), and cerebral infarction without residual deficits: Secondary | ICD-10-CM | POA: Diagnosis not present

## 2022-05-29 DIAGNOSIS — I10 Essential (primary) hypertension: Secondary | ICD-10-CM

## 2022-05-29 DIAGNOSIS — J4520 Mild intermittent asthma, uncomplicated: Secondary | ICD-10-CM | POA: Diagnosis not present

## 2022-05-29 DIAGNOSIS — E785 Hyperlipidemia, unspecified: Secondary | ICD-10-CM

## 2022-05-29 MED ORDER — TRIAMCINOLONE ACETONIDE 0.5 % EX OINT
1.0000 | TOPICAL_OINTMENT | Freq: Two times a day (BID) | CUTANEOUS | 0 refills | Status: AC
Start: 2022-05-29 — End: 2022-06-12

## 2022-05-29 MED ORDER — TRIAMCINOLONE ACETONIDE 0.5 % EX OINT
1.0000 | TOPICAL_OINTMENT | Freq: Two times a day (BID) | CUTANEOUS | 0 refills | Status: DC
Start: 2022-05-29 — End: 2022-05-29

## 2022-05-29 NOTE — Progress Notes (Signed)
[facility] clinic  Provider: Kenard Gower DNP  Code Status:  Full Code  Goals of Care:     05/21/2022   11:54 PM  Advanced Directives  Does Patient Have a Medical Advance Directive? No  Would patient like information on creating a medical advance directive? Yes (ED - Information included in AVS)     Chief Complaint  Patient presents with   Transitions Of Care    Allgeric reaction to new medication     HPI: Patient is a 52 y.o. female seen today for transition of care. She has a PMH of stroke with spastic hemiplegia and aphasia, wheelchair-bound, diabetes, gastroparesis, hyperlipidemia, depression, anxiety, hypothyroidism, GERD, hypertension, CAD and pheochromocytoma. She was seen in the ED on 05/21/22 for worsening neck pain and shortness of breath.Chest x-ray done was negative for pneumonia. She was discharged home with PRN Norco for pain.  She was accompanied today by her husband today. She denies pain on her neck and no SOB. She has dry erythematous rashes on her left breast and right chest which started 2 weeks ago. She stated that it itches a lot. She is not on any new medications. She uses Gain laundry soap and has been using it for a long time.  Past Medical History:  Diagnosis Date   CAD (coronary artery disease)    Depression    Diabetes mellitus without complication    History of CT scan    History of mammogram    History of MRI    Hypertension    Pheochromocytoma    Pheochromocytoma    Stroke    Thyroid disease     Past Surgical History:  Procedure Laterality Date   ABDOMINAL HYSTERECTOMY     CESAREAN SECTION     3   CORONARY ARTERY BYPASS GRAFT     Right adrenal gland removal for pheochromocytoma Right     Allergies  Allergen Reactions   Contrast Media [Iodinated Contrast Media] Anaphylaxis and Swelling   Penicillins Swelling    Mouth swells up and eyes swollen shut   Sumatriptan Anaphylaxis   Aspirin Swelling   Ciprofloxacin     Feel  nausea/vomitting   Morphine Swelling    Outpatient Encounter Medications as of 05/29/2022  Medication Sig   albuterol (PROVENTIL) (2.5 MG/3ML) 0.083% nebulizer solution Take 3 mLs (2.5 mg total) by nebulization every 6 (six) hours as needed for wheezing or shortness of breath.   albuterol (VENTOLIN HFA) 108 (90 Base) MCG/ACT inhaler Inhale 2 puffs into the lungs every 6 (six) hours as needed for wheezing or shortness of breath.   atorvastatin (LIPITOR) 80 MG tablet Take 80 mg by mouth daily.   budesonide (PULMICORT) 0.25 MG/2ML nebulizer solution Take 2 mLs (0.25 mg total) by nebulization 2 (two) times daily.   clopidogrel (PLAVIX) 75 MG tablet Take 75 mg by mouth daily.   Continuous Glucose Receiver (FREESTYLE LIBRE 3 READER) DEVI 1 Device by Does not apply route daily.   Continuous Glucose Sensor (FREESTYLE LIBRE 3 SENSOR) MISC 1 Device by Does not apply route daily. Place 1 sensor on the skin every 14 days. Use to check glucose continuously   cyclobenzaprine (FLEXERIL) 5 MG tablet Take 1 tablet (5 mg total) by mouth 3 (three) times daily as needed for muscle spasms.   diclofenac Sodium (VOLTAREN) 1 % GEL Apply 4 g topically 4 (four) times daily.   escitalopram (LEXAPRO) 20 MG tablet Take 1 tablet (20 mg total) by mouth every morning.   ezetimibe (  ZETIA) 10 MG tablet Take 1 tablet (10 mg total) by mouth daily.   gabapentin (NEURONTIN) 100 MG capsule TAKE ONE CAPSULE BY MOUTH THREE TIMES DAILY FOR NEUROPATHY   guaiFENesin (MUCINEX) 600 MG 12 hr tablet Take 1 tablet (600 mg total) by mouth 2 (two) times daily.   HYDROcodone-acetaminophen (NORCO/VICODIN) 5-325 MG tablet Take 1 tablet by mouth every 6 (six) hours as needed for moderate pain.   insulin aspart (NOVOLOG) 100 UNIT/ML FlexPen Inject 15 Units into the skin 3 (three) times daily with meals.   insulin glargine (LANTUS) 100 UNIT/ML Solostar Pen Inject 40 Units into the skin at bedtime.   Insulin Pen Needle 32G X 4 MM MISC Use to inject  insulin 4 times daily as directed.   LORazepam (ATIVAN) 0.5 MG tablet Take 1 tablet (0.5 mg total) by mouth at bedtime.   methimazole (TAPAZOLE) 5 MG tablet TAKE 1 TABLET BY MOUTH EVERY MORNING   metoprolol tartrate (LOPRESSOR) 100 MG tablet TAKE 1 TABLET BY MOUTH TWICE DAILY (BREAKFAST, BEDTIME)   nystatin cream (MYCOSTATIN) Apply 1 Application topically 2 (two) times daily. Affected areas on breast fold and groin and sacral areas   pantoprazole (PROTONIX) 40 MG tablet TAKE 1 TABLET BY MOUTH EVERY MORNING   polyethylene glycol powder (GLYCOLAX/MIRALAX) 17 GM/SCOOP powder Take 17 g by mouth daily. Hold for loose stool   tamsulosin (FLOMAX) 0.4 MG CAPS capsule TAKE 1 CAPSULE BY MOUTH AT BEDTIME   topiramate (TOPAMAX) 25 MG tablet TAKE 1 TABLET BY MOUTH TWICE DAILY FOR NEUROPATHY   triamcinolone ointment (KENALOG) 0.5 % Apply 1 Application topically 2 (two) times daily for 14 days.   zinc oxide (BALMEX) 11.3 % CREA cream Apply 1 Application topically 2 (two) times daily.   baclofen (LIORESAL) 10 MG tablet Take 1 tablet (10 mg total) by mouth 3 (three) times daily. X 1 week, then 10 mg TID-for spasticity- can increase  the dose if you call me to discuss   [DISCONTINUED] amLODipine (NORVASC) 10 MG tablet Take 1 tablet (10 mg total) by mouth daily.   [DISCONTINUED] blood glucose meter kit and supplies KIT Dispense based on patient and insurance preference. Use up to four times daily as directed.   [DISCONTINUED] MELATONIN GUMMIES PO Take 9 mg by mouth at bedtime as needed (for sleep). (Patient not taking: Reported on 05/29/2022)   [DISCONTINUED] senna (SENOKOT) 8.6 MG tablet Take 8.6 mg by mouth daily. (Patient not taking: Reported on 05/29/2022)   No facility-administered encounter medications on file as of 05/29/2022.    Review of Systems:  Review of Systems  Constitutional:  Negative for appetite change, chills, fatigue and fever.  HENT:  Negative for congestion, hearing loss, rhinorrhea and sore  throat.   Eyes: Negative.   Respiratory:  Negative for cough, shortness of breath and wheezing.   Cardiovascular:  Negative for chest pain, palpitations and leg swelling.  Gastrointestinal:  Negative for abdominal pain, constipation, diarrhea, nausea and vomiting.  Genitourinary:  Negative for dysuria.  Musculoskeletal:  Negative for arthralgias, back pain and myalgias.  Skin:  Positive for rash. Negative for color change and wound.       Rashes started 2 weeks ago  Neurological:  Negative for dizziness, weakness and headaches.  Psychiatric/Behavioral:  Negative for behavioral problems. The patient is not nervous/anxious.     Health Maintenance  Topic Date Due   Medicare Annual Wellness (AWV)  Never done   FOOT EXAM  Never done   OPHTHALMOLOGY EXAM  Never done  Diabetic kidney evaluation - Urine ACR  Never done   DTaP/Tdap/Td (1 - Tdap) Never done   COLONOSCOPY (Pts 45-80yrs Insurance coverage will need to be confirmed)  Never done   COVID-19 Vaccine (1) 06/06/2022 (Originally 04/11/1971)   INFLUENZA VACCINE  09/05/2022   HEMOGLOBIN A1C  11/19/2022   Diabetic kidney evaluation - eGFR measurement  05/22/2023   Hepatitis C Screening  Completed   HIV Screening  Completed   HPV VACCINES  Aged Out   PAP SMEAR-Modifier  Discontinued    Physical Exam: Vitals:   05/29/22 1114  BP: 132/88  Pulse: 94  Resp: 18  Temp: 97.8 F (36.6 C)  SpO2: 97%  Height: 5\' 2"  (1.575 m)   Body mass index is 34.39 kg/m. Physical Exam Constitutional:      Appearance: She is obese.  HENT:     Head: Normocephalic and atraumatic.     Nose: Nose normal.     Mouth/Throat:     Mouth: Mucous membranes are moist.  Eyes:     Conjunctiva/sclera: Conjunctivae normal.  Cardiovascular:     Rate and Rhythm: Normal rate and regular rhythm.  Pulmonary:     Effort: Pulmonary effort is normal.     Breath sounds: Normal breath sounds.  Abdominal:     General: Bowel sounds are normal.     Palpations:  Abdomen is soft.  Musculoskeletal:        General: Normal range of motion.     Cervical back: Normal range of motion.     Comments: Left hand contraction  Skin:    General: Skin is warm and dry.     Findings: Rash present.     Comments: Left breast with erythematous dry rashes and slight rashes on  right chest  Neurological:     Mental Status: She is alert and oriented to person, place, and time. Mental status is at baseline.     Motor: Weakness present.     Comments: LUE and BLE weakness due to previous stroke. Dysarthric  Psychiatric:        Mood and Affect: Mood normal.        Behavior: Behavior normal.        Thought Content: Thought content normal.        Judgment: Judgment normal.     Labs reviewed: Basic Metabolic Panel: Recent Labs    10/23/21 1137 11/02/21 0146 11/02/21 0246 11/02/21 2329 11/03/21 0811 11/04/21 0317 11/05/21 0259 11/06/21 0307 04/30/22 1128 04/30/22 1450 05/03/22 0104 05/20/22 1053 05/22/22 0229  NA 138   < >  --    < >  --  137 138   < > 128*   < > 130* 134* 134*  K 4.4   < >  --    < >  --  3.9 3.3*   < > 6.9*   < > 4.3 4.6 3.8  CL 106   < >  --    < >  --  112* 112*   < > 95*   < > 97* 100 103  CO2 19*   < >  --    < >  --  18* 20*   < > 19*   < > 22 21 20*  GLUCOSE 149*   < >  --    < >  --  233* 113*   < > 313*   < > 335* 269* 251*  BUN 17   < >  --    < >  --  18 20   < > 66*   < > 31* 17 14  CREATININE 0.81   < >  --    < >  --  0.64 0.69   < > 2.71*   < > 1.26* 0.80 0.82  CALCIUM 9.4   < >  --    < >  --  8.0* 8.3*   < > 9.3   < > 9.0 9.5 8.9  MG  --   --   --    < > 1.7 2.2 2.1  --   --   --   --   --   --   TSH 0.93  --  1.399  --   --   --   --   --  1.865  --   --   --   --    < > = values in this interval not displayed.   Liver Function Tests: Recent Labs    04/30/22 1128 05/01/22 0200 05/22/22 0229  AST 13* 11* 15  ALT 17 13 15   ALKPHOS 110 89 90  BILITOT 0.7 0.8 0.4  PROT 7.5 6.6 7.0  ALBUMIN 3.8 3.2* 3.3*    Recent Labs    06/11/21 1500 07/10/21 1233 07/11/21 1230  LIPASE 21 110* 42   Recent Labs    08/29/21 1420  AMMONIA 25   CBC: Recent Labs    04/30/22 1245 04/30/22 1508 05/03/22 0104 05/20/22 1053 05/22/22 0229  WBC 6.7   < > 7.3 7.1 8.8  NEUTROABS 4.4  --   --  4,288 4.2  HGB 13.6   < > 12.2 13.2 12.8  HCT 40.7   < > 36.1 40.8 38.1  MCV 77.5*   < > 76.0* 80.0 77.6*  PLT 92*   < > 355 364 346   < > = values in this interval not displayed.   Lipid Panel: Recent Labs    10/23/21 1137 03/01/22 0828  CHOL 142 148  HDL 51 44  LDLCALC 70 79  TRIG 127 143  CHOLHDL 2.8 3.4   Lab Results  Component Value Date   HGBA1C 11.3 (H) 05/20/2022    Procedures since last visit: CT CERVICAL SPINE WO CONTRAST  Result Date: 05/22/2022 CLINICAL DATA:  52 year old female with previous stroke. Headache. Neck pain. Neck stiffness. EXAM: CT CERVICAL SPINE WITHOUT CONTRAST TECHNIQUE: Multidetector CT imaging of the cervical spine was performed without intravenous contrast. Multiplanar CT image reconstructions were also generated. RADIATION DOSE REDUCTION: This exam was performed according to the departmental dose-optimization program which includes automated exposure control, adjustment of the mA and/or kV according to patient size and/or use of iterative reconstruction technique. COMPARISON:  Head CT today.  CTA head and neck 01/09/2021. FINDINGS: Alignment: Stable cervical lordosis. Cervicothoracic junction alignment is within normal limits. Bilateral posterior element alignment is within normal limits. Skull base and vertebrae: Visualized skull base is intact. No atlanto-occipital dissociation. C1 and C2 appear intact and aligned. No acute osseous abnormality identified. Soft tissues and spinal canal: No prevertebral fluid or swelling. No visible canal hematoma. Widespread severe bilateral vertebral artery calcified atherosclerosis. Bilateral carotid calcified plaque, extensive on the  right and bulky at the right carotid bifurcation. Negative visible other noncontrast neck soft tissues. Disc levels: Mild for age cervical spine degeneration. No CT evidence of spinal stenosis. 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 Lung apices are clear,  incidental right upper lobe azygous lobe and fissure (normal variant). IMPRESSION: 1. No acute osseous abnormality in the cervical spine. Mild for age cervical spine degeneration. No CT evidence of spinal stenosis. 2. Severe calcified atherosclerosis in the neck, especially affecting both vertebral arteries, right carotid. Electronically Signed   By: Odessa Fleming M.D.   On: 05/22/2022 05:16   CT Head Wo Contrast  Result Date: 05/22/2022 CLINICAL DATA:  52 year old female with previous stroke. Headache. Neck pain. Neck stiffness. EXAM: CT HEAD WITHOUT CONTRAST TECHNIQUE: Contiguous axial images were obtained from the base of the skull through the vertex without intravenous contrast. RADIATION DOSE REDUCTION: This exam was performed according to the departmental dose-optimization program which includes automated exposure control, adjustment of the mA and/or kV according to patient size and/or use of iterative reconstruction technique. COMPARISON:  Brain MRI 08/29/2021.  Head CT 04/30/2022. 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 Superimposed advanced chronic small vessel disease throughout the bilateral deep gray nuclei with scattered chronic bilateral lacunar infarcts. Patchy and confluent cerebral white matter disease. Areas of focal corpus callosum encephalomalacia better demonstrated by MRI. Small area of chronic cortical encephalomalacia in the left middle frontal gyrus and frontal operculum from prior infarct. Chronic cerebellar and brainstem volume loss, which by MRI seems only  in part related to right brainstem Wallerian degeneration. Stable cerebral volume. No midline shift, ventriculomegaly, mass effect, evidence of mass lesion, intracranial hemorrhage or evidence of cortically based acute infarction. Vascular: Extensive Calcified atherosclerosis at the skull base. Skull: No acute osseous abnormality identified. Sinuses/Orbits: Visualized paranasal sinuses and mastoids are stable and well aerated. Other: No acute orbit or scalp soft tissue finding. IMPRESSION: 1. No acute intracranial abnormality. 2. Chronic severe ischemic disease, and chronic partially nonspecific brainstem and cerebellar volume loss Cerebral Atrophy (ICD10-G31.9). Electronically Signed   By: Odessa Fleming M.D.   On: 05/22/2022 05:03   DG Chest Port 1 View  Result Date: 05/22/2022 CLINICAL DATA:  Shortness of breath EXAM: PORTABLE CHEST 1 VIEW COMPARISON:  Radiograph 04/30/2022 FINDINGS: Sternotomy and CABG. Stable cardiomediastinal silhouette. Normal variant azygous fissure. No focal consolidation, pleural effusion, or pneumothorax. No displaced rib fractures. IMPRESSION: No active disease. Electronically Signed   By: Minerva Fester M.D.   On: 05/22/2022 00:27   US RENAL  Result Date: 04/30/2022 CLINICAL DATA:  Acute kidney injury. EXAM: RENAL / URINARY TRACT ULTRASOUND COMPLETE COMPARISON:  CT AP 07/10/2021 FINDINGS: Right Kidney: Renal measurements: 10.0 x 4.4 x 3.7 cm = volume: 85.5 mL. Echogenicity within normal limits. No mass or hydronephrosis visualized. Left Kidney: Renal measurements: 9.8 x 5.3 x 5.4 cm = volume: 145.8 mL. Echogenicity within normal limits. No mass or hydronephrosis visualized. Bladder: Appears normal for degree of bladder distention. Other: Exam detail is diminished secondary to patient body habitus. IMPRESSION: No acute abnormality.  No signs of obstructive uropathy. Electronically Signed   By: Signa Kell M.D.   On: 04/30/2022 15:43   CT Head Wo Contrast  Result Date:  04/30/2022 CLINICAL DATA:  Left-sided weakness, acute stroke suspected, previous history of stroke with left-sided weakness EXAM: CT HEAD WITHOUT CONTRAST TECHNIQUE: Contiguous axial images were obtained from the base of the skull through the vertex without intravenous contrast. RADIATION DOSE REDUCTION: This exam was performed according to the departmental dose-optimization program which includes automated exposure control, adjustment of the mA and/or kV according to patient size and/or use of iterative reconstruction technique. COMPARISON:  11/03/2021 FINDINGS: Brain: No evidence of acute  infarction, hemorrhage, hydrocephalus, extra-axial collection or mass lesion/mass effect. Periventricular and deep white matter hypodensity. Unchanged small bilateral lacunar infarctions of the basal ganglia (series 3, image 20). Unchanged encephalomalacia of the left frontal lobe (series 3, image 16) and left cerebellar hemisphere (series 3, image 26). Vascular: No hyperdense vessel or unexpected calcification. Skull: Normal. Negative for fracture or focal lesion. Sinuses/Orbits: No acute finding. Other: None. IMPRESSION: 1. No CT evidence of acute intracranial pathology. 2. Small-vessel white matter disease significantly advanced for patient age. 3. Unchanged small bilateral lacunar infarctions of the basal ganglia. Unchanged encephalomalacia of the left frontal lobe and left cerebellar hemisphere. 4. Consider MRI to more sensitively evaluate for acute diffusion restricting infarction if clinically suspected. Electronically Signed   By: Jearld Lesch M.D.   On: 04/30/2022 13:00   DG Chest Portable 1 View  Result Date: 04/30/2022 CLINICAL DATA:  Hypoxia EXAM: PORTABLE CHEST 1 VIEW COMPARISON:  CXR 11/03/21 FINDINGS: No pleural effusion. No pneumothorax. Low lung volumes. Left basilar atelectasis. Likely unchanged cardiac and mediastinal contours when accounting for differences in lung volumes. Status post median sternotomy.  No focal airspace opacity. No radiographically apparent displaced rib fractures. Visualized upper abdomen is unremarkable. Surgical clips in the right upper quadrant IMPRESSION: Low lung volumes likely left basilar atelectasis. Electronically Signed   By: Lorenza Cambridge M.D.   On: 04/30/2022 11:58    Assessment/Plan  1. Rash and other nonspecific skin eruption -  not on any medications - triamcinolone ointment (KENALOG) 0.5 %; Apply 1 Application topically 2 (two) times daily for 14 days.  Dispense: 28 g; Refill: 0  2. Mild intermittent asthma without complication -  no SOB nor wheezing -  continue Pulmicort neb BID and and PRN albuterol neb  3. History of CVA (cerebrovascular accident) -  stable -  has LUE and BLE weakness -  continue Plavix and Zetia  4. Essential hypertension -   BPs at home usually 139/84 -   continue Metoprolol tartrate  5. Type 2 diabetes mellitus with hyperlipidemia Lab Results  Component Value Date   HGBA1C 11.3 (H) 05/20/2022   -  CBGs at home ranging from 122-150 -  continue Novolog and Lantus  6. Hyperthyroidism Lab Results  Component Value Date   TSH 1.865 04/30/2022    -   continue Tapazole  7. Gastroesophageal reflux disease without esophagitis -  denies abdominal pain -  continue Protonix  8. 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  Torticollis -  denies neck pain,  -  continue PRN Norco, Voltaren gel and Flexeril PRN    Labs/tests ordered:  None  Next appt:  05/31/2022

## 2022-05-31 ENCOUNTER — Telehealth: Payer: Self-pay

## 2022-05-31 ENCOUNTER — Encounter: Payer: Medicare HMO | Admitting: Adult Health

## 2022-05-31 NOTE — Telephone Encounter (Signed)
Please verify refer for service ? PT or Nurse or Aide

## 2022-05-31 NOTE — Telephone Encounter (Signed)
Patient called requesting referral be sent to [facility]  Message sent to Richarda Blade, NP

## 2022-06-06 ENCOUNTER — Telehealth: Payer: Self-pay | Admitting: *Deleted

## 2022-06-06 NOTE — Telephone Encounter (Signed)
G[place] left a message re Baclofen 10 mg directions on script also confirm that Flexeril rx due to duplicate therapy.

## 2022-06-12 ENCOUNTER — Telehealth: Payer: Self-pay

## 2022-06-12 NOTE — Telephone Encounter (Signed)
Please verify need for Child psychotherapist.also check which DME company nebulizer was faxed.

## 2022-06-12 NOTE — Telephone Encounter (Signed)
error 

## 2022-06-12 NOTE — Telephone Encounter (Signed)
Patient caregiver call this afternoon in need of assistance for a social worker referral from her [place], Donalee Citrin, NPand that she was also wanted an update on the nebulizer that was order on May 21, 2022 because she has not received an update.  Message routed to Ngetich, Donalee Citrin, NP

## 2022-06-13 ENCOUNTER — Telehealth: Payer: Self-pay

## 2022-06-13 NOTE — Telephone Encounter (Signed)
Noted  

## 2022-06-13 NOTE — Telephone Encounter (Signed)
Ladona Ridgel with [facility] called requesting verbal orders for speech therapy 1 time a week for 6 weeks.   Verbal orders given  Message sent  to Richarda Blade, NP [facility])

## 2022-06-14 ENCOUNTER — Telehealth: Payer: Self-pay

## 2022-06-14 ENCOUNTER — Other Ambulatory Visit: Payer: Self-pay

## 2022-06-14 DIAGNOSIS — E785 Hyperlipidemia, unspecified: Secondary | ICD-10-CM

## 2022-06-14 MED ORDER — BLOOD GLUCOSE METER KIT: 1.0000 | PACK | 0 refills | Status: DC

## 2022-06-14 MED ORDER — BLOOD GLUCOSE METER KIT
1.0000 | PACK | 0 refills | Status: DC
Start: 2022-06-14 — End: 2022-06-14

## 2022-06-14 MED ORDER — INSULIN ASPART 100 UNIT/ML FLEXPEN
15.0000 [IU] | PEN_INJECTOR | Freq: Three times a day (TID) | SUBCUTANEOUS | 1 refills | Status: DC
Start: 2022-06-14 — End: 2022-08-20

## 2022-06-14 MED ORDER — INSULIN GLARGINE 100 UNIT/ML SOLOSTAR PEN: 40.0000 [IU] | PEN_INJECTOR | Freq: Every day | SUBCUTANEOUS | 2 refills | Status: DC

## 2022-06-14 NOTE — Telephone Encounter (Signed)
Noted  

## 2022-06-14 NOTE — Telephone Encounter (Signed)
Spoke with patient this morning in regards on the update on the Nebulizer machine order from [employer]

## 2022-06-14 NOTE — Telephone Encounter (Signed)
Spoke with patient this morning in regards on the update on the Nebulizer machine order from [employer] and that patient has a agreed to pay out of pocket for the Nebulizer.  Discard  the first message.  Message routed to Ngetich, Donalee Citrin, NP

## 2022-06-14 NOTE — Telephone Encounter (Signed)
Patient called and states that she needs refills on the following items. Patient was using [facility] but wants lancets, device, and senor. Medications pend and sent to PCP Ngetich, Donalee Citrin, NP for approval and instructions.

## 2022-06-14 NOTE — Telephone Encounter (Signed)
Please fax glucometer to pharmacy unable to e-scribe

## 2022-06-17 ENCOUNTER — Other Ambulatory Visit: Payer: Self-pay | Admitting: Adult Health

## 2022-06-17 ENCOUNTER — Telehealth: Payer: Self-pay

## 2022-06-17 MED ORDER — TRIAMCINOLONE ACETONIDE 0.5 % EX OINT: 1.0000 | TOPICAL_OINTMENT | Freq: Two times a day (BID) | CUTANEOUS | 0 refills | Status: DC

## 2022-06-17 NOTE — Telephone Encounter (Signed)
Refill request received from [facility] for triamcinolone ointment. Medication not on active medication list. Medication was prescribed 4/24 to use for 14 days. Is patient to continue medication. Please advise.  Message sent to Kenard Gower, NP

## 2022-06-17 NOTE — Telephone Encounter (Signed)
-   Prescription sent to [facility]

## 2022-06-23 ENCOUNTER — Other Ambulatory Visit: Payer: Self-pay | Admitting: Family

## 2022-06-23 DIAGNOSIS — G43019 Migraine without aura, intractable, without status migrainosus: Secondary | ICD-10-CM

## 2022-06-24 MED ORDER — BLOOD GLUCOSE METER KIT
1.0000 | PACK | 0 refills | Status: DC
Start: 2022-06-24 — End: 2022-12-27

## 2022-06-24 NOTE — Telephone Encounter (Signed)
Unable to see this request due to PCP Ngetich, Donalee Citrin, NP sending encounter to wrong Medical Assistant. Encounter redone and glucometer faxed to patient pharmacy.

## 2022-06-26 ENCOUNTER — Other Ambulatory Visit: Payer: Self-pay

## 2022-06-26 DIAGNOSIS — E1159 Type 2 diabetes mellitus with other circulatory complications: Secondary | ICD-10-CM

## 2022-06-26 DIAGNOSIS — F419 Anxiety disorder, unspecified: Secondary | ICD-10-CM

## 2022-06-26 DIAGNOSIS — K5901 Slow transit constipation: Secondary | ICD-10-CM

## 2022-06-26 DIAGNOSIS — E0590 Thyrotoxicosis, unspecified without thyrotoxic crisis or storm: Secondary | ICD-10-CM

## 2022-06-26 DIAGNOSIS — B3731 Acute candidiasis of vulva and vagina: Secondary | ICD-10-CM

## 2022-06-26 NOTE — Telephone Encounter (Signed)
Dois Davenport, Nurse with [facility] called present with the patient and requesting refills on medications.  High warning came up when trying to refill medications.   Medications have been pended and sent to Richarda Blade, NP

## 2022-06-27 ENCOUNTER — Telehealth: Payer: Self-pay

## 2022-06-27 MED ORDER — ZINC OXIDE 11.3 % EX CREA: 1.0000 | TOPICAL_CREAM | Freq: Two times a day (BID) | CUTANEOUS | 5 refills | Status: DC

## 2022-06-27 MED ORDER — DICLOFENAC SODIUM 1 % EX GEL: 4.0000 g | Freq: Four times a day (QID) | CUTANEOUS | 0 refills | Status: DC

## 2022-06-27 MED ORDER — LORAZEPAM 0.5 MG PO TABS
0.5000 mg | ORAL_TABLET | Freq: Every day | ORAL | 5 refills | Status: DC
Start: 2022-06-27 — End: 2022-07-10

## 2022-06-27 MED ORDER — ATORVASTATIN CALCIUM 80 MG PO TABS: 80.0000 mg | ORAL_TABLET | Freq: Every day | ORAL | 1 refills | Status: DC

## 2022-06-27 MED ORDER — ESCITALOPRAM OXALATE 20 MG PO TABS
20.0000 mg | ORAL_TABLET | Freq: Every morning | ORAL | 8 refills | Status: DC
Start: 2022-06-27 — End: 2022-08-27

## 2022-06-27 MED ORDER — POLYETHYLENE GLYCOL 3350 17 GM/SCOOP PO POWD: 17.0000 g | Freq: Every day | ORAL | 1 refills | Status: DC

## 2022-06-27 MED ORDER — CLOPIDOGREL BISULFATE 75 MG PO TABS: 75.0000 mg | ORAL_TABLET | Freq: Every day | ORAL | 1 refills | Status: DC

## 2022-06-27 MED ORDER — METOPROLOL TARTRATE 100 MG PO TABS: ORAL_TABLET | ORAL | 1 refills | Status: DC

## 2022-06-27 MED ORDER — GABAPENTIN 100 MG PO CAPS: ORAL_CAPSULE | ORAL | 1 refills | Status: DC

## 2022-06-27 MED ORDER — METHIMAZOLE 5 MG PO TABS: 5.0000 mg | ORAL_TABLET | Freq: Every morning | ORAL | 1 refills | Status: DC

## 2022-06-27 MED ORDER — EZETIMIBE 10 MG PO TABS: 10.0000 mg | ORAL_TABLET | Freq: Every day | ORAL | 1 refills | Status: DC

## 2022-06-27 MED ORDER — NYSTATIN 100000 UNIT/GM EX CREA: 1.0000 | TOPICAL_CREAM | Freq: Two times a day (BID) | CUTANEOUS | 0 refills | Status: DC

## 2022-06-27 NOTE — Telephone Encounter (Signed)
Once bed available and ready for transfer notify provider to complete F L 2 form to be faxed to the facility.Facility to notify provider if TB test required.

## 2022-06-27 NOTE — Telephone Encounter (Signed)
Patient states that she would like to go to [facility].  Message sent to Richarda Blade, NP

## 2022-06-28 ENCOUNTER — Encounter: Payer: 59 | Attending: Physical Medicine & Rehabilitation | Admitting: Physical Medicine & Rehabilitation

## 2022-06-28 DIAGNOSIS — G8194 Hemiplegia, unspecified affecting left nondominant side: Secondary | ICD-10-CM | POA: Insufficient documentation

## 2022-06-28 DIAGNOSIS — G8114 Spastic hemiplegia affecting left nondominant side: Secondary | ICD-10-CM | POA: Insufficient documentation

## 2022-06-28 DIAGNOSIS — I639 Cerebral infarction, unspecified: Secondary | ICD-10-CM | POA: Insufficient documentation

## 2022-06-28 DIAGNOSIS — Z993 Dependence on wheelchair: Secondary | ICD-10-CM | POA: Insufficient documentation

## 2022-06-28 DIAGNOSIS — I69398 Other sequelae of cerebral infarction: Secondary | ICD-10-CM | POA: Insufficient documentation

## 2022-06-28 DIAGNOSIS — R252 Cramp and spasm: Secondary | ICD-10-CM | POA: Insufficient documentation

## 2022-06-28 NOTE — Telephone Encounter (Signed)
Noted  

## 2022-06-28 NOTE — Telephone Encounter (Signed)
Patient will be getting in contact with facility and letting our office know when bed available.

## 2022-07-04 ENCOUNTER — Other Ambulatory Visit: Payer: Self-pay

## 2022-07-04 ENCOUNTER — Emergency Department (HOSPITAL_COMMUNITY): Payer: 59

## 2022-07-04 ENCOUNTER — Encounter (HOSPITAL_COMMUNITY): Payer: Self-pay | Admitting: Family Medicine

## 2022-07-04 ENCOUNTER — Inpatient Hospital Stay (HOSPITAL_COMMUNITY)
Admission: EM | Admit: 2022-07-04 | Discharge: 2022-07-10 | DRG: 690 | Disposition: A | Discharge status: Home Health | Payer: 59 | Attending: Internal Medicine | Admitting: Internal Medicine

## 2022-07-04 DIAGNOSIS — I69354 Hemiplegia and hemiparesis following cerebral infarction affecting left non-dominant side: Secondary | ICD-10-CM

## 2022-07-04 DIAGNOSIS — N3000 Acute cystitis without hematuria: Secondary | ICD-10-CM | POA: Diagnosis not present

## 2022-07-04 DIAGNOSIS — Z885 Allergy status to narcotic agent status: Secondary | ICD-10-CM | POA: Diagnosis not present

## 2022-07-04 DIAGNOSIS — F32A Depression, unspecified: Secondary | ICD-10-CM | POA: Diagnosis not present

## 2022-07-04 DIAGNOSIS — Z1152 Encounter for screening for COVID-19: Secondary | ICD-10-CM

## 2022-07-04 DIAGNOSIS — Z951 Presence of aortocoronary bypass graft: Secondary | ICD-10-CM

## 2022-07-04 DIAGNOSIS — Z1611 Resistance to penicillins: Secondary | ICD-10-CM | POA: Diagnosis present

## 2022-07-04 DIAGNOSIS — Z794 Long term (current) use of insulin: Secondary | ICD-10-CM

## 2022-07-04 DIAGNOSIS — Z833 Family history of diabetes mellitus: Secondary | ICD-10-CM

## 2022-07-04 DIAGNOSIS — Z8673 Personal history of transient ischemic attack (TIA), and cerebral infarction without residual deficits: Secondary | ICD-10-CM

## 2022-07-04 DIAGNOSIS — I959 Hypotension, unspecified: Secondary | ICD-10-CM

## 2022-07-04 DIAGNOSIS — Z880 Allergy status to penicillin: Secondary | ICD-10-CM | POA: Diagnosis not present

## 2022-07-04 DIAGNOSIS — J4520 Mild intermittent asthma, uncomplicated: Secondary | ICD-10-CM | POA: Diagnosis present

## 2022-07-04 DIAGNOSIS — Z832 Family history of diseases of the blood and blood-forming organs and certain disorders involving the immune mechanism: Secondary | ICD-10-CM

## 2022-07-04 DIAGNOSIS — F419 Anxiety disorder, unspecified: Secondary | ICD-10-CM | POA: Diagnosis not present

## 2022-07-04 DIAGNOSIS — R42 Dizziness and giddiness: Secondary | ICD-10-CM | POA: Diagnosis present

## 2022-07-04 DIAGNOSIS — Z6834 Body mass index (BMI) 34.0-34.9, adult: Secondary | ICD-10-CM

## 2022-07-04 DIAGNOSIS — Z888 Allergy status to other drugs, medicaments and biological substances status: Secondary | ICD-10-CM

## 2022-07-04 DIAGNOSIS — Z7902 Long term (current) use of antithrombotics/antiplatelets: Secondary | ICD-10-CM

## 2022-07-04 DIAGNOSIS — B961 Klebsiella pneumoniae [K. pneumoniae] as the cause of diseases classified elsewhere: Secondary | ICD-10-CM | POA: Diagnosis present

## 2022-07-04 DIAGNOSIS — N390 Urinary tract infection, site not specified: Secondary | ICD-10-CM | POA: Diagnosis present

## 2022-07-04 DIAGNOSIS — L299 Pruritus, unspecified: Secondary | ICD-10-CM | POA: Diagnosis not present

## 2022-07-04 DIAGNOSIS — Z91041 Radiographic dye allergy status: Secondary | ICD-10-CM

## 2022-07-04 DIAGNOSIS — Z825 Family history of asthma and other chronic lower respiratory diseases: Secondary | ICD-10-CM

## 2022-07-04 DIAGNOSIS — I2510 Atherosclerotic heart disease of native coronary artery without angina pectoris: Secondary | ICD-10-CM | POA: Diagnosis not present

## 2022-07-04 DIAGNOSIS — T368X5A Adverse effect of other systemic antibiotics, initial encounter: Secondary | ICD-10-CM | POA: Diagnosis not present

## 2022-07-04 DIAGNOSIS — E039 Hypothyroidism, unspecified: Secondary | ICD-10-CM | POA: Diagnosis present

## 2022-07-04 DIAGNOSIS — E1169 Type 2 diabetes mellitus with other specified complication: Secondary | ICD-10-CM | POA: Diagnosis present

## 2022-07-04 DIAGNOSIS — Z79899 Other long term (current) drug therapy: Secondary | ICD-10-CM

## 2022-07-04 DIAGNOSIS — E669 Obesity, unspecified: Secondary | ICD-10-CM | POA: Diagnosis present

## 2022-07-04 DIAGNOSIS — Z8249 Family history of ischemic heart disease and other diseases of the circulatory system: Secondary | ICD-10-CM

## 2022-07-04 DIAGNOSIS — I10 Essential (primary) hypertension: Secondary | ICD-10-CM | POA: Diagnosis not present

## 2022-07-04 DIAGNOSIS — A419 Sepsis, unspecified organism: Principal | ICD-10-CM

## 2022-07-04 DIAGNOSIS — E0590 Thyrotoxicosis, unspecified without thyrotoxic crisis or storm: Secondary | ICD-10-CM | POA: Diagnosis not present

## 2022-07-04 DIAGNOSIS — Z7951 Long term (current) use of inhaled steroids: Secondary | ICD-10-CM

## 2022-07-04 DIAGNOSIS — G43019 Migraine without aura, intractable, without status migrainosus: Secondary | ICD-10-CM

## 2022-07-04 DIAGNOSIS — Z881 Allergy status to other antibiotic agents status: Secondary | ICD-10-CM

## 2022-07-04 DIAGNOSIS — Z886 Allergy status to analgesic agent status: Secondary | ICD-10-CM

## 2022-07-04 DIAGNOSIS — E785 Hyperlipidemia, unspecified: Secondary | ICD-10-CM | POA: Diagnosis not present

## 2022-07-04 DIAGNOSIS — E119 Type 2 diabetes mellitus without complications: Secondary | ICD-10-CM | POA: Diagnosis present

## 2022-07-04 DIAGNOSIS — R109 Unspecified abdominal pain: Secondary | ICD-10-CM

## 2022-07-04 DIAGNOSIS — Z808 Family history of malignant neoplasm of other organs or systems: Secondary | ICD-10-CM

## 2022-07-04 LAB — URINALYSIS, W/ REFLEX TO CULTURE (INFECTION SUSPECTED)
Bilirubin Urine: NEGATIVE
Glucose, UA: NEGATIVE mg/dL
Hgb urine dipstick: NEGATIVE
Ketones, ur: NEGATIVE mg/dL
Nitrite: POSITIVE — AB
Protein, ur: NEGATIVE mg/dL
Specific Gravity, Urine: 1.01 (ref 1.005–1.030)
pH: 5 (ref 5.0–8.0)

## 2022-07-04 LAB — CBC WITH DIFFERENTIAL/PLATELET
Abs Immature Granulocytes: 0.02 10*3/uL (ref 0.00–0.07)
Basophils Absolute: 0.1 10*3/uL (ref 0.0–0.1)
Basophils Relative: 1 %
Eosinophils Absolute: 0.2 10*3/uL (ref 0.0–0.5)
Eosinophils Relative: 2 %
HCT: 36.1 % (ref 36.0–46.0)
Hemoglobin: 11.8 g/dL — ABNORMAL LOW (ref 12.0–15.0)
Immature Granulocytes: 0 %
Lymphocytes Relative: 34 %
Lymphs Abs: 2.9 10*3/uL (ref 0.7–4.0)
MCH: 26.5 pg (ref 26.0–34.0)
MCHC: 32.7 g/dL (ref 30.0–36.0)
MCV: 80.9 fL (ref 80.0–100.0)
Monocytes Absolute: 0.4 10*3/uL (ref 0.1–1.0)
Monocytes Relative: 5 %
Neutro Abs: 5.1 10*3/uL (ref 1.7–7.7)
Neutrophils Relative %: 58 %
Platelets: 361 10*3/uL (ref 150–400)
RBC: 4.46 MIL/uL (ref 3.87–5.11)
RDW: 14.8 % (ref 11.5–15.5)
WBC: 8.6 10*3/uL (ref 4.0–10.5)
nRBC: 0 % (ref 0.0–0.2)

## 2022-07-04 LAB — COMPREHENSIVE METABOLIC PANEL
ALT: 17 U/L (ref 0–44)
AST: 15 U/L (ref 15–41)
Albumin: 3.7 g/dL (ref 3.5–5.0)
Alkaline Phosphatase: 117 U/L (ref 38–126)
Anion gap: 15 (ref 5–15)
BUN: 22 mg/dL — ABNORMAL HIGH (ref 6–20)
CO2: 24 mmol/L (ref 22–32)
Calcium: 9.4 mg/dL (ref 8.9–10.3)
Chloride: 98 mmol/L (ref 98–111)
Creatinine, Ser: 1.06 mg/dL — ABNORMAL HIGH (ref 0.44–1.00)
GFR, Estimated: >60 mL/min (ref >60)
Glucose, Bld: 197 mg/dL — ABNORMAL HIGH (ref 70–99)
Potassium: 4 mmol/L (ref 3.5–5.1)
Sodium: 137 mmol/L (ref 135–145)
Total Bilirubin: 0.6 mg/dL (ref 0.3–1.2)
Total Protein: 8.1 g/dL (ref 6.5–8.1)

## 2022-07-04 LAB — LACTIC ACID, PLASMA: Lactic Acid, Venous: 2.4 mmol/L (ref 0.5–1.9)

## 2022-07-04 LAB — PROTIME-INR
INR: 1 (ref 0.8–1.2)
Prothrombin Time: 13.4 seconds (ref 11.4–15.2)

## 2022-07-04 LAB — URINE CULTURE

## 2022-07-04 LAB — RESP PANEL BY RT-PCR (RSV, FLU A&B, COVID)  RVPGX2
Influenza A by PCR: NEGATIVE
Influenza B by PCR: NEGATIVE
Resp Syncytial Virus by PCR: NEGATIVE
SARS Coronavirus 2 by RT PCR: NEGATIVE

## 2022-07-04 LAB — APTT: aPTT: 27 seconds (ref 24–36)

## 2022-07-04 LAB — LIPASE, BLOOD: Lipase: 23 U/L (ref 11–51)

## 2022-07-04 MED ORDER — ALBUTEROL SULFATE (2.5 MG/3ML) 0.083% IN NEBU: 2.5000 mg | INHALATION_SOLUTION | Freq: Four times a day (QID) | RESPIRATORY_TRACT | Status: DC | PRN

## 2022-07-04 MED ORDER — VANCOMYCIN HCL 1750 MG/350ML IV SOLN
1750.0000 mg | Freq: Once | INTRAVENOUS | Status: AC
  Administered 2022-07-04: 1750 mg via INTRAVENOUS
  Filled 2022-07-04: qty 350

## 2022-07-04 MED ORDER — VANCOMYCIN HCL IN DEXTROSE 1-5 GM/200ML-% IV SOLN: 1000.0000 mg | INTRAVENOUS | Status: DC

## 2022-07-04 MED ORDER — GABAPENTIN 100 MG PO CAPS
100.0000 mg | ORAL_CAPSULE | Freq: Three times a day (TID) | ORAL | Status: DC
  Administered 2022-07-05 – 2022-07-10 (×17): 100 mg via ORAL
  Filled 2022-07-04 (×17): qty 1

## 2022-07-04 MED ORDER — METHYLPREDNISOLONE SODIUM SUCC 125 MG IJ SOLR: 125.0000 mg | Freq: Once | INTRAMUSCULAR | Status: AC

## 2022-07-04 MED ORDER — CYCLOBENZAPRINE HCL 10 MG PO TABS
5.0000 mg | ORAL_TABLET | Freq: Three times a day (TID) | ORAL | Status: DC | PRN
  Administered 2022-07-05 – 2022-07-06 (×3): 5 mg via ORAL
  Filled 2022-07-04 (×3): qty 1

## 2022-07-04 MED ORDER — LACTATED RINGERS IV BOLUS (SEPSIS)
1000.0000 mL | Freq: Once | INTRAVENOUS | Status: AC
  Administered 2022-07-04: 1000 mL via INTRAVENOUS

## 2022-07-04 MED ORDER — SODIUM CHLORIDE 0.9 % IV SOLN: 2.0000 g | Freq: Three times a day (TID) | INTRAVENOUS | Status: DC

## 2022-07-04 MED ORDER — ACETAMINOPHEN 325 MG PO TABS
650.0000 mg | ORAL_TABLET | Freq: Four times a day (QID) | ORAL | Status: DC | PRN
  Administered 2022-07-05: 650 mg via ORAL
  Filled 2022-07-04: qty 2

## 2022-07-04 MED ORDER — DIPHENHYDRAMINE HCL 50 MG/ML IJ SOLN
25.0000 mg | Freq: Once | INTRAMUSCULAR | Status: AC
  Administered 2022-07-04: 25 mg via INTRAVENOUS
  Filled 2022-07-04: qty 1

## 2022-07-04 MED ORDER — PROCHLORPERAZINE EDISYLATE 10 MG/2ML IJ SOLN: 5.0000 mg | INTRAMUSCULAR | Status: DC | PRN

## 2022-07-04 MED ORDER — SODIUM CHLORIDE 0.9% FLUSH
3.0000 mL | Freq: Two times a day (BID) | INTRAVENOUS | Status: DC
  Administered 2022-07-05 – 2022-07-09 (×5): 3 mL via INTRAVENOUS

## 2022-07-04 MED ORDER — TOPIRAMATE 25 MG PO TABS
25.0000 mg | ORAL_TABLET | Freq: Two times a day (BID) | ORAL | Status: DC
  Administered 2022-07-05 – 2022-07-10 (×12): 25 mg via ORAL
  Filled 2022-07-04 (×12): qty 1

## 2022-07-04 MED ORDER — POLYETHYLENE GLYCOL 3350 17 G PO PACK: 17.0000 g | PACK | Freq: Every day | ORAL | Status: DC | PRN

## 2022-07-04 MED ORDER — INSULIN GLARGINE-YFGN 100 UNIT/ML ~~LOC~~ SOLN
20.0000 [IU] | Freq: Every day | SUBCUTANEOUS | Status: DC
  Administered 2022-07-05: 20 [IU] via SUBCUTANEOUS
  Filled 2022-07-04 (×2): qty 0.2

## 2022-07-04 MED ORDER — VANCOMYCIN HCL IN DEXTROSE 1-5 GM/200ML-% IV SOLN
1000.0000 mg | Freq: Once | INTRAVENOUS | Status: DC
  Filled 2022-07-04: qty 200

## 2022-07-04 MED ORDER — METHYLPREDNISOLONE SODIUM SUCC 125 MG IJ SOLR
INTRAMUSCULAR | Status: AC
  Administered 2022-07-04: 125 mg via INTRAVENOUS
  Filled 2022-07-04: qty 2

## 2022-07-04 MED ORDER — TAMSULOSIN HCL 0.4 MG PO CAPS
0.4000 mg | ORAL_CAPSULE | Freq: Every day | ORAL | Status: DC
  Administered 2022-07-05 – 2022-07-09 (×6): 0.4 mg via ORAL
  Filled 2022-07-04 (×6): qty 1

## 2022-07-04 MED ORDER — INSULIN ASPART 100 UNIT/ML IJ SOLN
0.0000 [IU] | Freq: Every day | INTRAMUSCULAR | Status: DC
  Administered 2022-07-05 – 2022-07-08 (×2): 3 [IU] via SUBCUTANEOUS

## 2022-07-04 MED ORDER — HYDROMORPHONE HCL 1 MG/ML IJ SOLN
0.5000 mg | Freq: Once | INTRAMUSCULAR | Status: AC
  Administered 2022-07-04: 0.5 mg via INTRAVENOUS
  Filled 2022-07-04: qty 1

## 2022-07-04 MED ORDER — LACTATED RINGERS IV SOLN: INTRAVENOUS | Status: DC

## 2022-07-04 MED ORDER — EPINEPHRINE 0.3 MG/0.3ML IJ SOAJ
0.3000 mg | Freq: Once | INTRAMUSCULAR | Status: AC
  Administered 2022-07-04: 0.3 mg via INTRAMUSCULAR

## 2022-07-04 MED ORDER — SODIUM CHLORIDE 0.9 % IV SOLN
2.0000 g | Freq: Once | INTRAVENOUS | Status: AC
  Administered 2022-07-04: 2 g via INTRAVENOUS
  Filled 2022-07-04: qty 10

## 2022-07-04 MED ORDER — METRONIDAZOLE 500 MG/100ML IV SOLN
500.0000 mg | Freq: Once | INTRAVENOUS | Status: AC
  Administered 2022-07-04: 500 mg via INTRAVENOUS
  Filled 2022-07-04: qty 100

## 2022-07-04 MED ORDER — FAMOTIDINE IN NACL 20-0.9 MG/50ML-% IV SOLN
20.0000 mg | Freq: Once | INTRAVENOUS | Status: AC
  Administered 2022-07-04: 20 mg via INTRAVENOUS
  Filled 2022-07-04: qty 50

## 2022-07-04 MED ORDER — ESCITALOPRAM OXALATE 20 MG PO TABS
20.0000 mg | ORAL_TABLET | Freq: Every morning | ORAL | Status: DC
  Administered 2022-07-05 – 2022-07-10 (×6): 20 mg via ORAL
  Filled 2022-07-04: qty 1
  Filled 2022-07-04: qty 2
  Filled 2022-07-04 (×4): qty 1

## 2022-07-04 MED ORDER — CLOPIDOGREL BISULFATE 75 MG PO TABS
75.0000 mg | ORAL_TABLET | Freq: Every day | ORAL | Status: DC
  Administered 2022-07-05 – 2022-07-10 (×6): 75 mg via ORAL
  Filled 2022-07-04 (×6): qty 1

## 2022-07-04 MED ORDER — SODIUM CHLORIDE 0.9 % IV SOLN
2.0000 g | Freq: Every day | INTRAVENOUS | Status: DC
  Administered 2022-07-05 – 2022-07-07 (×4): 2 g via INTRAVENOUS
  Filled 2022-07-04 (×4): qty 20

## 2022-07-04 MED ORDER — LACTATED RINGERS IV BOLUS (SEPSIS)
1000.0000 mL | Freq: Once | INTRAVENOUS | Status: AC
  Administered 2022-07-05: 1000 mL via INTRAVENOUS

## 2022-07-04 MED ORDER — LORAZEPAM 0.5 MG PO TABS
0.5000 mg | ORAL_TABLET | Freq: Every day | ORAL | Status: DC
  Administered 2022-07-05 – 2022-07-09 (×6): 0.5 mg via ORAL
  Filled 2022-07-04 (×6): qty 1

## 2022-07-04 MED ORDER — METHIMAZOLE 5 MG PO TABS
5.0000 mg | ORAL_TABLET | Freq: Every morning | ORAL | Status: DC
  Administered 2022-07-05 – 2022-07-10 (×6): 5 mg via ORAL
  Filled 2022-07-04 (×6): qty 1

## 2022-07-04 MED ORDER — PANTOPRAZOLE SODIUM 40 MG PO TBEC
40.0000 mg | DELAYED_RELEASE_TABLET | Freq: Every morning | ORAL | Status: DC
  Administered 2022-07-05 – 2022-07-10 (×6): 40 mg via ORAL
  Filled 2022-07-04 (×6): qty 1

## 2022-07-04 MED ORDER — EPINEPHRINE PF 1 MG/ML IJ SOLN
INTRAMUSCULAR | Status: AC
  Filled 2022-07-04: qty 1

## 2022-07-04 MED ORDER — ACETAMINOPHEN 650 MG RE SUPP: 650.0000 mg | Freq: Four times a day (QID) | RECTAL | Status: DC | PRN

## 2022-07-04 MED ORDER — OXYCODONE HCL 5 MG PO TABS
5.0000 mg | ORAL_TABLET | ORAL | Status: DC | PRN
  Administered 2022-07-05 – 2022-07-09 (×7): 5 mg via ORAL
  Filled 2022-07-04 (×7): qty 1

## 2022-07-04 MED ORDER — INSULIN ASPART 100 UNIT/ML IJ SOLN
3.0000 [IU] | Freq: Three times a day (TID) | INTRAMUSCULAR | Status: DC
  Administered 2022-07-05 (×2): 3 [IU] via SUBCUTANEOUS

## 2022-07-04 MED ORDER — ATORVASTATIN CALCIUM 80 MG PO TABS
80.0000 mg | ORAL_TABLET | Freq: Every day | ORAL | Status: DC
  Administered 2022-07-05 – 2022-07-10 (×6): 80 mg via ORAL
  Filled 2022-07-04 (×2): qty 1
  Filled 2022-07-04: qty 2
  Filled 2022-07-04 (×3): qty 1

## 2022-07-04 MED ORDER — INSULIN ASPART 100 UNIT/ML IJ SOLN
0.0000 [IU] | Freq: Three times a day (TID) | INTRAMUSCULAR | Status: DC
  Administered 2022-07-05: 9 [IU] via SUBCUTANEOUS
  Administered 2022-07-05: 7 [IU] via SUBCUTANEOUS
  Administered 2022-07-05: 5 [IU] via SUBCUTANEOUS
  Administered 2022-07-06: 1 [IU] via SUBCUTANEOUS
  Administered 2022-07-06: 3 [IU] via SUBCUTANEOUS
  Administered 2022-07-07: 1 [IU] via SUBCUTANEOUS
  Administered 2022-07-08 (×2): 2 [IU] via SUBCUTANEOUS
  Administered 2022-07-09 (×2): 3 [IU] via SUBCUTANEOUS

## 2022-07-04 MED ORDER — BACLOFEN 10 MG PO TABS
10.0000 mg | ORAL_TABLET | Freq: Three times a day (TID) | ORAL | Status: DC
  Administered 2022-07-05 – 2022-07-10 (×17): 10 mg via ORAL
  Filled 2022-07-04 (×17): qty 1

## 2022-07-04 MED ORDER — ENOXAPARIN SODIUM 40 MG/0.4ML IJ SOSY
40.0000 mg | PREFILLED_SYRINGE | Freq: Every day | INTRAMUSCULAR | Status: DC
  Filled 2022-07-04 (×3): qty 0.4

## 2022-07-04 MED ORDER — HYDROMORPHONE HCL 1 MG/ML IJ SOLN
0.5000 mg | INTRAMUSCULAR | Status: AC | PRN
  Administered 2022-07-05 (×2): 0.5 mg via INTRAVENOUS
  Filled 2022-07-04 (×2): qty 1

## 2022-07-04 NOTE — ED Provider Notes (Signed)
[place] EMERGENCY DEPARTMENT AT [facility] Provider Note   CSN: [id number] Arrival date & time: 07/04/22  1836     History  Chief Complaint  Patient presents with   Dizziness    Pt coming from home with dizziness for 3 days. Pt found to be hypotensive per EMS    [name] is a 52 y.o. female.  The history is provided by the patient.  Dizziness Quality:  Lightheadedness Severity:  Moderate Onset quality:  Gradual Timing:  Constant Progression:  Unchanged Relieved by:  Nothing Worsened by:  Nothing Ineffective treatments:  None tried Associated symptoms: no chest pain, no diarrhea, no headaches, no nausea, no palpitations, no shortness of breath and no vomiting        Home Medications Prior to Admission medications   Medication Sig Start Date End Date Taking? Authorizing Provider  albuterol (PROVENTIL) (2.5 MG/3ML) 0.083% nebulizer solution Take 3 mLs (2.5 mg total) by nebulization every 6 (six) hours as needed for wheezing or shortness of breath. 05/21/22   Ngetich, Dinah C, NP  albuterol (VENTOLIN HFA) 108 (90 Base) MCG/ACT inhaler Inhale 2 puffs into the lungs every 6 (six) hours as needed for wheezing or shortness of breath. 05/08/22   Ngetich, Dinah C, NP  atorvastatin (LIPITOR) 80 MG tablet Take 1 tablet (80 mg total) by mouth daily. 06/27/22   Ngetich, Dinah C, NP  baclofen (LIORESAL) 10 MG tablet Take 1 tablet (10 mg total) by mouth 3 (three) times daily. X 1 week, then 10 mg TID-for spasticity- can increase  the dose if you call me to discuss 05/22/22   Genice Rouge, MD  blood glucose meter kit and supplies 1 each by Other route as directed. Dispense based on patient and insurance preference. Use up to four times daily as directed. (FOR ICD-10 E10.9, E11.9). 06/24/22   Ngetich, Dinah C, NP  budesonide (PULMICORT) 0.25 MG/2ML nebulizer solution Take 2 mLs (0.25 mg total) by nebulization 2 (two) times daily. 05/21/22   Ngetich, Dinah C, NP  clopidogrel  (PLAVIX) 75 MG tablet Take 1 tablet (75 mg total) by mouth daily. 06/27/22   Ngetich, Dinah C, NP  cyclobenzaprine (FLEXERIL) 5 MG tablet Take 1 tablet (5 mg total) by mouth 3 (three) times daily as needed for muscle spasms. 05/22/22   Lovorn, Aundra Millet, MD  diclofenac Sodium (VOLTAREN) 1 % GEL Apply 4 g topically 4 (four) times daily. 06/27/22   Ngetich, Dinah C, NP  escitalopram (LEXAPRO) 20 MG tablet Take 1 tablet (20 mg total) by mouth every morning. 06/27/22   Ngetich, Dinah C, NP  ezetimibe (ZETIA) 10 MG tablet Take 1 tablet (10 mg total) by mouth daily. 06/27/22   Ngetich, Dinah C, NP  gabapentin (NEURONTIN) 100 MG capsule TAKE ONE CAPSULE BY MOUTH THREE TIMES DAILY FOR NEUROPATHY 06/27/22   Ngetich, Dinah C, NP  guaiFENesin (MUCINEX) 600 MG 12 hr tablet Take 1 tablet (600 mg total) by mouth 2 (two) times daily. 05/21/22   Ngetich, Dinah C, NP  HYDROcodone-acetaminophen (NORCO/VICODIN) 5-325 MG tablet Take 1 tablet by mouth every 6 (six) hours as needed for moderate pain. 05/22/22   [name], MD  insulin aspart (NOVOLOG) 100 UNIT/ML FlexPen Inject 15 Units into the skin 3 (three) times daily with meals. 06/14/22   Ngetich, Dinah C, NP  insulin glargine (LANTUS) 100 UNIT/ML Solostar Pen Inject 40 Units into the skin at bedtime. 06/14/22   Ngetich, Dinah C, NP  Insulin Pen Needle 32G X  4 MM MISC Use to inject insulin 4 times daily as directed. 05/21/22   Ngetich, Dinah C, NP  LORazepam (ATIVAN) 0.5 MG tablet Take 1 tablet (0.5 mg total) by mouth at bedtime. 06/27/22   Ngetich, Dinah C, NP  methimazole (TAPAZOLE) 5 MG tablet Take 1 tablet (5 mg total) by mouth every morning. 06/27/22   Ngetich, Dinah C, NP  metoprolol tartrate (LOPRESSOR) 100 MG tablet TAKE 1 TABLET BY MOUTH TWICE DAILY (BREAKFAST, BEDTIME) 06/27/22   Ngetich, Dinah C, NP  nystatin cream (MYCOSTATIN) Apply 1 Application topically 2 (two) times daily. Affected areas on breast fold and groin and sacral areas 06/27/22   Ngetich, Dinah C,  NP  pantoprazole (PROTONIX) 40 MG tablet TAKE 1 TABLET BY MOUTH EVERY MORNING 05/06/22   Ngetich, Dinah C, NP  polyethylene glycol powder (GLYCOLAX/MIRALAX) 17 GM/SCOOP powder Take 17 g by mouth daily. Hold for loose stool 06/27/22   Ngetich, Dinah C, NP  tamsulosin (FLOMAX) 0.4 MG CAPS capsule TAKE 1 CAPSULE BY MOUTH AT BEDTIME 05/06/22   Ngetich, Dinah C, NP  topiramate (TOPAMAX) 25 MG tablet TAKE ONE TABLET (25MG ) BY MOUTH TWICE DAILY 06/24/22   Ngetich, Dinah C, NP  triamcinolone ointment (KENALOG) 0.5 % Apply 1 Application topically 2 (two) times daily. 06/17/22   Medina-Vargas, Monina C, NP  zinc oxide (BALMEX) 11.3 % CREA cream Apply 1 Application topically 2 (two) times daily. 06/27/22   Ngetich, Dinah C, NP      Allergies    Contrast media [iodinated contrast media], Penicillins, Sumatriptan, Aspirin, Ciprofloxacin, and Morphine    Review of Systems   Review of Systems  Constitutional:  Negative for chills, fatigue and fever.  HENT:  Negative for congestion.   Respiratory:  Negative for cough, chest tightness and shortness of breath.   Cardiovascular:  Negative for chest pain and palpitations.  Gastrointestinal:  Positive for abdominal pain and constipation. Negative for diarrhea, nausea and vomiting.  Genitourinary:  Positive for dysuria. Negative for flank pain.  Musculoskeletal:  Negative for back pain and neck pain.  Skin:  Negative for rash and wound.  Neurological:  Positive for dizziness. Negative for light-headedness, numbness and headaches.  Psychiatric/Behavioral:  Negative for agitation and confusion.   All other systems reviewed and are negative.   Physical Exam Updated Vital Signs Ht 5\' 2"  (1.575 m)   Wt 85.3 kg   SpO2 99%   BMI 34.39 kg/m  Physical Exam Vitals and nursing note reviewed.  Constitutional:      General: She is not in acute distress.    Appearance: She is well-developed.  HENT:     Head: Normocephalic and atraumatic.     Nose: Nose normal.      Mouth/Throat:     Mouth: Mucous membranes are moist.  Eyes:     Conjunctiva/sclera: Conjunctivae normal.  Cardiovascular:     Rate and Rhythm: Normal rate and regular rhythm.     Heart sounds: No murmur heard. Pulmonary:     Effort: Pulmonary effort is normal. No respiratory distress.     Breath sounds: Normal breath sounds. No wheezing, rhonchi or rales.  Chest:     Chest wall: No tenderness.  Abdominal:     General: Abdomen is flat.     Palpations: Abdomen is soft.     Tenderness: There is abdominal tenderness. There is no guarding or rebound.  Musculoskeletal:        General: No swelling.     Cervical back: Neck supple.  Skin:    General: Skin is warm and dry.     Capillary Refill: Capillary refill takes less than 2 seconds.     Findings: No erythema or rash.  Neurological:     General: No focal deficit present.     Mental Status: She is alert.  Psychiatric:        Mood and Affect: Mood normal.     ED Results / Procedures / Treatments   Labs (all labs ordered are listed, but only abnormal results are displayed) Labs Reviewed  LACTIC ACID, PLASMA - Abnormal; Notable for the following components:      Result Value   Lactic Acid, Venous 2.4 (*)    All other components within normal limits  COMPREHENSIVE METABOLIC PANEL - Abnormal; Notable for the following components:   Glucose, Bld 197 (*)    BUN 22 (*)    Creatinine, Ser 1.06 (*)    All other components within normal limits  URINALYSIS, W/ REFLEX TO CULTURE (INFECTION SUSPECTED) - Abnormal; Notable for the following components:   Nitrite POSITIVE (*)    Leukocytes,Ua MODERATE (*)    Bacteria, UA RARE (*)    All other components within normal limits  CBC WITH DIFFERENTIAL/PLATELET - Abnormal; Notable for the following components:   Hemoglobin 11.8 (*)    All other components within normal limits  RESP PANEL BY RT-PCR (RSV, FLU A&B, COVID)  [id number]  URINE CULTURE  CULTURE, BLOOD (ROUTINE X 2)  CULTURE, BLOOD  (ROUTINE X 2)  PROTIME-INR  APTT  LIPASE, BLOOD  LACTIC ACID, PLASMA  CBC WITH DIFFERENTIAL/PLATELET  BASIC METABOLIC PANEL  CBC    EKG None  Radiology CT ABDOMEN PELVIS WO CONTRAST  Result Date: 07/04/2022 CLINICAL DATA:  Sepsis abdominal pain EXAM: CT ABDOMEN AND PELVIS WITHOUT CONTRAST TECHNIQUE: Multidetector CT imaging of the abdomen and pelvis was performed following the standard protocol without IV contrast. RADIATION DOSE REDUCTION: This exam was performed according to the departmental dose-optimization program which includes automated exposure control, adjustment of the mA and/or kV according to patient size and/or use of iterative reconstruction technique. COMPARISON:  CT 07/10/2021 FINDINGS: Lower chest: Lung bases demonstrate no acute airspace disease. Atelectasis or scarring at the bases. Coronary vascular calcification. Hepatobiliary: No focal liver abnormality is seen. No gallstones, gallbladder wall thickening, or biliary dilatation. Pancreas: Unremarkable. No pancreatic ductal dilatation or surrounding inflammatory changes. Spleen: Normal in size without focal abnormality. Adrenals/Urinary Tract: Status post right adrenalectomy. Left adrenal gland within normal limits. Kidneys show no hydronephrosis. The bladder is unremarkable Stomach/Bowel: The stomach is nonenlarged. No dilated small bowel. No acute bowel wall thickening. Negative appendix. Vascular/Lymphatic: Advanced aortic atherosclerosis. No aneurysm. No suspicious lymph nodes. Reproductive: Hysterectomy. Small gas and hyperdense material in the vagina. No adnexal mass Other: Negative for pelvic effusion or free air. Anterior abdominal wall laxity. Ventral scar. Heterogeneous soft tissue density in the subcutaneous soft tissues measuring 11 mm to the right of midline, series 3, image 74 and along the low anterior pelvis, this measures 3.6 by 2.1 cm. Musculoskeletal: No acute osseous abnormality. IMPRESSION: 1. No CT evidence  for acute intra-abdominal or pelvic abnormality. 2. Status post right adrenalectomy. 3. Heterogeneous soft tissue density in the subcutaneous soft tissues of the anterior abdominal wall and along the low anterior pelvis, grossly stable and potentially due to prominent soft tissue scarring 4. Aortic atherosclerosis. Aortic Atherosclerosis (ICD10-I70.0). Electronically Signed   By: Jasmine Pang M.D.   On: 07/04/2022 21:22   DG Chest [facility]  1 View  Result Date: 07/04/2022 CLINICAL DATA:  Possible sepsis EXAM: PORTABLE CHEST 1 VIEW COMPARISON:  05/22/2022 FINDINGS: Post sternotomy changes. No acute airspace disease or effusion. Stable cardiomediastinal silhouette. No pneumothorax IMPRESSION: No active disease. Electronically Signed   By: Jasmine Pang M.D.   On: 07/04/2022 20:05    Procedures Procedures    Medications Ordered in ED Medications  lactated ringers bolus 1,000 mL (0 mLs Intravenous Stopped 07/04/22 2317)    And  lactated ringers bolus 1,000 mL (0 mLs Intravenous Stopped 07/04/22 2317)    And  lactated ringers bolus 1,000 mL (has no administration in time range)  atorvastatin (LIPITOR) tablet 80 mg (has no administration in time range)  escitalopram (LEXAPRO) tablet 20 mg (has no administration in time range)  LORazepam (ATIVAN) tablet 0.5 mg (has no administration in time range)  methimazole (TAPAZOLE) tablet 5 mg (has no administration in time range)  pantoprazole (PROTONIX) EC tablet 40 mg (has no administration in time range)  tamsulosin (FLOMAX) capsule 0.4 mg (has no administration in time range)  clopidogrel (PLAVIX) tablet 75 mg (has no administration in time range)  baclofen (LIORESAL) tablet 10 mg (has no administration in time range)  cyclobenzaprine (FLEXERIL) tablet 5 mg (has no administration in time range)  gabapentin (NEURONTIN) capsule 100 mg (has no administration in time range)  topiramate (TOPAMAX) tablet 25 mg (has no administration in time range)  albuterol  (VENTOLIN HFA) 108 (90 Base) MCG/ACT inhaler 2 puff (has no administration in time range)  insulin aspart (novoLOG) injection 0-5 Units (has no administration in time range)  insulin glargine-yfgn (SEMGLEE) injection 20 Units (has no administration in time range)  insulin aspart (novoLOG) injection 3 Units (has no administration in time range)  insulin aspart (novoLOG) injection 0-9 Units (has no administration in time range)  enoxaparin (LOVENOX) injection 40 mg (has no administration in time range)  sodium chloride flush (NS) 0.9 % injection 3 mL (has no administration in time range)  acetaminophen (TYLENOL) tablet 650 mg (has no administration in time range)    Or  acetaminophen (TYLENOL) suppository 650 mg (has no administration in time range)  oxyCODONE (Oxy IR/ROXICODONE) immediate release tablet 5 mg (has no administration in time range)  HYDROmorphone (DILAUDID) injection 0.5 mg (has no administration in time range)  polyethylene glycol (MIRALAX / GLYCOLAX) packet 17 g (has no administration in time range)  prochlorperazine (COMPAZINE) injection 5 mg (has no administration in time range)  cefTRIAXone (ROCEPHIN) 2 g in sodium chloride 0.9 % 100 mL IVPB (has no administration in time range)  EPINEPHrine (EPI-PEN) injection 0.3 mg (has no administration in time range)  methylPREDNISolone sodium succinate (SOLU-MEDROL) 125 mg/2 mL injection 125 mg (has no administration in time range)  famotidine (PEPCID) IVPB 20 mg premix (has no administration in time range)  EPINEPHrine (ADRENALIN) 1 MG/ML (has no administration in time range)  methylPREDNISolone sodium succinate (SOLU-MEDROL) 125 mg/2 mL injection (has no administration in time range)  aztreonam (AZACTAM) 2 g in sodium chloride 0.9 % 100 mL IVPB (0 g Intravenous Stopped 07/04/22 2023)  metroNIDAZOLE (FLAGYL) IVPB 500 mg (0 mg Intravenous Stopped 07/04/22 2215)  vancomycin (VANCOREADY) IVPB 1750 mg/350 mL (0 mg Intravenous Stopped 07/04/22  2328)  HYDROmorphone (DILAUDID) injection 0.5 mg (0.5 mg Intravenous Given 07/04/22 2212)  diphenhydrAMINE (BENADRYL) injection 25 mg (25 mg Intravenous Given 07/04/22 2326)    ED Course/ Medical Decision Making/ A&P  Medical Decision Making Amount and/or Complexity of Data Reviewed Labs: ordered. Radiology: ordered. ECG/medicine tests: ordered.  Risk Prescription drug management. Decision regarding hospitalization.    Lori [name] is a 52 y.o. female with a complex past medical history including hypertension, diabetes, previous stroke, pheochromocytoma, previous DKA, migraines, thyroid disease, CAD with CABG, and previous hysterectomy who presents with several days of subjective fevers, chills, nausea, constipation, severe abdominal pain, dysuria, and was found to be tachycardic and hypotensive by EMS.  According to patient, she has had illness for the last few days and is having shaking chills.  She reports that abdominal pain that is up to 10 out of 10 in severity and all over her abdomen.  It is not going to her back.  She reports nausea no vomiting and reports some constipation chronically.  She does not report blood in her stools.  She reports some dysuria and thinks may have a UTI.  She was feeling lightheaded and fatigued and EMS found her blood pressure to be 82 systolic.  On arrival, patient was found to be tachycardic, was tachypneic on my exam, and blood pressure was in the 90s.  Given her chills and concern for infection, we will make her a code sepsis and begin her workup.  On exam, lungs were clear and she was not having significant coughing now.  She denies chest pain or shortness of breath.  Abdomen was diffusely tender.  Bowel sounds were appreciated however.  Flanks nontender.  No rashes seen.  Patient moving extremities.  Patient denies new neurologic complaints from her baseline.  Clinically I am concerned about sepsis.  Will make sure she  is not a UTI but given the abdominal pain and tenderness, will make sure she does not have a diverticulitis, appendicitis, bowel obstruction, cholecystitis, or other intra-abdominal pathology as well.  She has a contrast allergy so we will get a noncontrasted CT.  Will get chest x-ray and labs.  Due to her hypotension and suspected sepsis, anticipate she will need admission when workup is completed.  9:56 PM Workup began to return.  Her urinalysis does show nitrites leukocytes and bacteria, given her urinary symptoms and sepsis I suspect this is the source.  Her lactic acid is elevated, will trend after the fluids.  She was negative for COVID/flu/RSV and CBC returned reassuring.  Metabolic panel did not show critical abnormalities.  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  There is also a chronic appearing soft tissue density but no evidence of abscess seen.  Patient will be admitted for further management.  Blood pressure has improved after some fluids but she is still tachycardic.  Will admit for UTI and sepsis.    11:25 PM Patient has already been admitted however patient appears to be having a allergic reaction to either the pain medicine or possibly antibiotics.  She has some puffiness to her face and some redness.  Unclear if this is [place].  Will give some Benadryl and let the admitting team know.  11:31 PM She is now complaining of more facial swelling and feels like her throat is swollen.  She does have some wheezing now.  Will give epinephrine, steroids, and Pepcid to go along with the Benadryl.         Final Clinical Impression(s) / ED Diagnoses Final diagnoses:  Sepsis, due to unspecified organism, unspecified whether acute organ dysfunction present (HCC)  Urinary tract infection without hematuria, site unspecified  Abdominal pain, unspecified abdominal location   Hypotension, unspecified hypotension type     Clinical Impression: 1. Sepsis, due to unspecified organism, unspecified whether acute organ dysfunction present (HCC)   2. Urinary tract infection without hematuria, site unspecified   3. Abdominal pain, unspecified abdominal location   4. Hypotension, unspecified hypotension type     Disposition: Admit  This note was prepared with assistance of Dragon voice recognition software. Occasional wrong-word or sound-a-like substitutions may have occurred due to the inherent limitations of voice recognition software.     Ashley Montminy, Canary Brim, MD 07/04/22 [id number]

## 2022-07-04 NOTE — Progress Notes (Signed)
Notified bedside nurse of need to draw repeat lactic acid. 

## 2022-07-04 NOTE — H&P (Signed)
History and Physical    Lori [name] ZOX:[id number] DOB: [birth year] DOA: 07/04/2022  PCP: Caesar Bookman, NP   Patient coming from: Home   Chief Complaint: Lightheaded, lower abdominal pain, dysuria   HPI: Lori [name] is a 52 y.o. female with medical history significant for CAD status post CABG, CVA with spastic hemiplegia, hypertension, insulin-dependent diabetes mellitus, hypothyroidism, depression, anxiety, and pheochromocytoma status post adrenalectomy who presents to the emergency department with lightheadedness, abdominal discomfort, and dysuria.  Patient reports approximately 3 days of lower abdominal pain, dysuria, and lightheadedness.  She also reports mild cough recently which has been nonproductive.  EMS was called and patient was found to have systolic blood pressure in the low 80s.  ED Course: Upon arrival to the ED, patient is found to be afebrile and saturating well on room air with elevated heart rate and systolic blood pressure of 95 and greater.  CT of the abdomen and pelvis is negative for acute findings.  No acute abnormality noted on chest x-ray.  Labs are most notable for lactic acid 2.4, normal WBC, and creatinine 1.06.  Blood and urine cultures were collected in the ED and the patient was given 3 L of LR, vancomycin, Azactam, Flagyl, and Dilaudid.  She developed erythema and pruritus involving her upper body during infusion of vancomycin and was treated with IV Benadryl, Pepcid, Solu-Medrol, and IM epinephrine.  Review of Systems:  All other systems reviewed and apart from HPI, are negative.  Past Medical History:  Diagnosis Date   CAD (coronary artery disease)    Depression    Diabetes mellitus without complication (HCC)    History of CT scan    History of mammogram    History of MRI    Hypertension    Pheochromocytoma    Pheochromocytoma    Stroke [facility])    Thyroid disease     Past Surgical History:  Procedure Laterality Date   ABDOMINAL  HYSTERECTOMY     CESAREAN SECTION     3   CORONARY ARTERY BYPASS GRAFT     Right adrenal gland removal for pheochromocytoma Right     Social History:   reports that she has never smoked. She has never used smokeless tobacco. She reports that she does not currently use alcohol. She reports that she does not use drugs.  Allergies  Allergen Reactions   Contrast Media [Iodinated Contrast Media] Anaphylaxis and Swelling   Penicillins Swelling    Mouth swells up and eyes swollen shut   Sumatriptan Anaphylaxis   Aspirin Swelling   Ciprofloxacin     Feel nausea/vomitting   Morphine Swelling    Family History  Problem Relation Age of Onset   Hypertension Mother    Diabetes Mother    Atrial fibrillation Mother    Hypertension Father    Heart attack Father    Dementia Father    Diabetes Brother    Brain cancer Brother    Asthma Daughter    Sickle cell anemia Son    Atrial fibrillation Maternal Uncle      Prior to Admission medications   Medication Sig Start Date End Date Taking? Authorizing Provider  albuterol (PROVENTIL) (2.5 MG/3ML) 0.083% nebulizer solution Take 3 mLs (2.5 mg total) by nebulization every 6 (six) hours as needed for wheezing or shortness of breath. 05/21/22   Ngetich, Dinah C, NP  albuterol (VENTOLIN HFA) 108 (90 Base) MCG/ACT inhaler Inhale 2 puffs into the lungs every 6 (six) hours as needed for  wheezing or shortness of breath. 05/08/22   Ngetich, Dinah C, NP  atorvastatin (LIPITOR) 80 MG tablet Take 1 tablet (80 mg total) by mouth daily. 06/27/22   Ngetich, Dinah C, NP  baclofen (LIORESAL) 10 MG tablet Take 1 tablet (10 mg total) by mouth 3 (three) times daily. X 1 week, then 10 mg TID-for spasticity- can increase  the dose if you call me to discuss 05/22/22   Genice Rouge, MD  blood glucose meter kit and supplies 1 each by Other route as directed. Dispense based on patient and insurance preference. Use up to four times daily as directed. (FOR ICD-10 E10.9, E11.9).  06/24/22   Ngetich, Dinah C, NP  budesonide (PULMICORT) 0.25 MG/2ML nebulizer solution Take 2 mLs (0.25 mg total) by nebulization 2 (two) times daily. 05/21/22   Ngetich, Dinah C, NP  clopidogrel (PLAVIX) 75 MG tablet Take 1 tablet (75 mg total) by mouth daily. 06/27/22   Ngetich, Dinah C, NP  cyclobenzaprine (FLEXERIL) 5 MG tablet Take 1 tablet (5 mg total) by mouth 3 (three) times daily as needed for muscle spasms. 05/22/22   Lovorn, Aundra Millet, MD  diclofenac Sodium (VOLTAREN) 1 % GEL Apply 4 g topically 4 (four) times daily. 06/27/22   Ngetich, Dinah C, NP  escitalopram (LEXAPRO) 20 MG tablet Take 1 tablet (20 mg total) by mouth every morning. 06/27/22   Ngetich, Dinah C, NP  ezetimibe (ZETIA) 10 MG tablet Take 1 tablet (10 mg total) by mouth daily. 06/27/22   Ngetich, Dinah C, NP  gabapentin (NEURONTIN) 100 MG capsule TAKE ONE CAPSULE BY MOUTH THREE TIMES DAILY FOR NEUROPATHY 06/27/22   Ngetich, Dinah C, NP  guaiFENesin (MUCINEX) 600 MG 12 hr tablet Take 1 tablet (600 mg total) by mouth 2 (two) times daily. 05/21/22   Ngetich, Dinah C, NP  HYDROcodone-acetaminophen (NORCO/VICODIN) 5-325 MG tablet Take 1 tablet by mouth every 6 (six) hours as needed for moderate pain. 05/22/22   [name], MD  insulin aspart (NOVOLOG) 100 UNIT/ML FlexPen Inject 15 Units into the skin 3 (three) times daily with meals. 06/14/22   Ngetich, Dinah C, NP  insulin glargine (LANTUS) 100 UNIT/ML Solostar Pen Inject 40 Units into the skin at bedtime. 06/14/22   Ngetich, Dinah C, NP  Insulin Pen Needle 32G X 4 MM MISC Use to inject insulin 4 times daily as directed. 05/21/22   Ngetich, Dinah C, NP  LORazepam (ATIVAN) 0.5 MG tablet Take 1 tablet (0.5 mg total) by mouth at bedtime. 06/27/22   Ngetich, Dinah C, NP  methimazole (TAPAZOLE) 5 MG tablet Take 1 tablet (5 mg total) by mouth every morning. 06/27/22   Ngetich, Dinah C, NP  metoprolol tartrate (LOPRESSOR) 100 MG tablet TAKE 1 TABLET BY MOUTH TWICE DAILY (BREAKFAST, BEDTIME)  06/27/22   Ngetich, Dinah C, NP  nystatin cream (MYCOSTATIN) Apply 1 Application topically 2 (two) times daily. Affected areas on breast fold and groin and sacral areas 06/27/22   Ngetich, Dinah C, NP  pantoprazole (PROTONIX) 40 MG tablet TAKE 1 TABLET BY MOUTH EVERY MORNING 05/06/22   Ngetich, Dinah C, NP  polyethylene glycol powder (GLYCOLAX/MIRALAX) 17 GM/SCOOP powder Take 17 g by mouth daily. Hold for loose stool 06/27/22   Ngetich, Dinah C, NP  tamsulosin (FLOMAX) 0.4 MG CAPS capsule TAKE 1 CAPSULE BY MOUTH AT BEDTIME 05/06/22   Ngetich, Dinah C, NP  topiramate (TOPAMAX) 25 MG tablet TAKE ONE TABLET (25MG ) BY MOUTH TWICE DAILY 06/24/22   Ngetich, Donalee Citrin, NP  triamcinolone ointment (KENALOG) 0.5 % Apply 1 Application topically 2 (two) times daily. 06/17/22   Medina-Vargas, Monina C, NP  zinc oxide (BALMEX) 11.3 % CREA cream Apply 1 Application topically 2 (two) times daily. 06/27/22   Ngetich, Donalee Citrin, NP    Physical Exam: Vitals:   07/04/22 2216 07/04/22 2355 07/05/22 0011 07/05/22 0012  BP:  111/77    Pulse: (!) 110 (!) 111 (!) 110 (!) 109  Resp:      Temp:      TempSrc:      SpO2: 100% 100% 100% 100%  Weight:      Height:        Constitutional: NAD, no pallor or diaphoresis   Eyes: PERTLA, lids and conjunctivae normal ENMT: Mucous membranes are moist. Posterior pharynx clear of any exudate or lesions.   Neck: supple, no masses  Respiratory: no wheezing, no crackles. No accessory muscle use.  Cardiovascular: S1 & S2 heard, regular rate and rhythm. No JVD. Abdomen: No distension, no tenderness, soft. Bowel sounds active.  Musculoskeletal: no clubbing / cyanosis. No joint deformity upper and lower extremities.   Skin: no significant rashes, lesions, ulcers. Warm, dry, well-perfused. Neurologic: Expressive aphasia. Hemiplegia.   Psychiatric: Calm. Cooperative.    Labs and Imaging on Admission: I have personally reviewed following labs and imaging studies  CBC: Recent Labs  Lab  07/04/22 2122  WBC 8.6  NEUTROABS 5.1  HGB 11.8*  HCT 36.1  MCV 80.9  PLT 361   Basic Metabolic Panel: Recent Labs  Lab 07/04/22 1954  NA 137  K 4.0  CL 98  CO2 24  GLUCOSE 197*  BUN 22*  CREATININE 1.06*  CALCIUM 9.4   GFR: Estimated Creatinine Clearance: 65.1 mL/min (A) (by C-G formula based on SCr of 1.06 mg/dL (H)). Liver Function Tests: Recent Labs  Lab 07/04/22 1954  AST 15  ALT 17  ALKPHOS 117  BILITOT 0.6  PROT 8.1  ALBUMIN 3.7   Recent Labs  Lab 07/04/22 1954  LIPASE 23   No results for input(s): "AMMONIA" in the last 168 hours. Coagulation Profile: Recent Labs  Lab 07/04/22 1954  INR 1.0   Cardiac Enzymes: No results for input(s): "CKTOTAL", "CKMB", "CKMBINDEX", "TROPONINI" in the last 168 hours. BNP (last 3 results) No results for input(s): "PROBNP" in the last 8760 hours. HbA1C: No results for input(s): "HGBA1C" in the last 72 hours. CBG: No results for input(s): "GLUCAP" in the last 168 hours. Lipid Profile: No results for input(s): "CHOL", "HDL", "LDLCALC", "TRIG", "CHOLHDL", "LDLDIRECT" in the last 72 hours. Thyroid Function Tests: No results for input(s): "TSH", "T4TOTAL", "FREET4", "T3FREE", "THYROIDAB" in the last 72 hours. Anemia Panel: No results for input(s): "VITAMINB12", "FOLATE", "FERRITIN", "TIBC", "IRON", "RETICCTPCT" in the last 72 hours. Urine analysis:    Component Value Date/Time   COLORURINE YELLOW 07/04/2022 2031   APPEARANCEUR CLEAR 07/04/2022 2031   LABSPEC 1.010 07/04/2022 2031   PHURINE 5.0 07/04/2022 2031   GLUCOSEU NEGATIVE 07/04/2022 2031   HGBUR NEGATIVE 07/04/2022 2031   BILIRUBINUR NEGATIVE 07/04/2022 2031   KETONESUR NEGATIVE 07/04/2022 2031   PROTEINUR NEGATIVE 07/04/2022 2031   NITRITE POSITIVE (A) 07/04/2022 2031   LEUKOCYTESUR MODERATE (A) 07/04/2022 2031   Sepsis Labs: @LABRCNTIP (procalcitonin:4,lacticidven:4) ) Recent Results (from the past 240 hour(s))  Resp panel by RT-PCR (RSV, Flu A&B,  Covid) Anterior Nasal Swab     Status: None   Collection Time: 07/04/22  8:31 PM   Specimen: Anterior Nasal Swab  Result Value Ref  Range Status   SARS Coronavirus 2 by RT PCR NEGATIVE NEGATIVE Final   Influenza A by PCR NEGATIVE NEGATIVE Final   Influenza B by PCR NEGATIVE NEGATIVE Final    Comment: (NOTE) The Xpert Xpress SARS-CoV-2/FLU/RSV plus assay is intended as an aid in the diagnosis of influenza from Nasopharyngeal swab specimens and should not be used as a sole basis for treatment. Nasal washings and aspirates are unacceptable for Xpert Xpress SARS-CoV-2/FLU/RSV testing.  Fact Sheet for Patients: BloggerCourse.com  Fact Sheet for Healthcare Providers: SeriousBroker.it  This test is not yet approved or cleared by the [place] FDA and has been authorized for detection and/or diagnosis of SARS-CoV-2 by FDA under an Emergency Use Authorization (EUA). This EUA will remain in effect (meaning this test can be used) for the duration of the COVID-19 declaration under Section 564(b)(1) of the Act, 21 U.S.C. section 360bbb-3(b)(1), unless the authorization is terminated or revoked.     Resp Syncytial Virus by PCR NEGATIVE NEGATIVE Final    Comment: (NOTE) Fact Sheet for Patients: BloggerCourse.com  Fact Sheet for Healthcare Providers: SeriousBroker.it  This test is not yet approved or cleared by the [place] FDA and has been authorized for detection and/or diagnosis of SARS-CoV-2 by FDA under an Emergency Use Authorization (EUA). This EUA will remain in effect (meaning this test can be used) for the duration of the COVID-19 declaration under Section 564(b)(1) of the Act, 21 U.S.C. section 360bbb-3(b)(1), unless the authorization is terminated or revoked.  Performed at [facility], [address], [place], [place] [address]   Urine Culture     Status: None  (Preliminary result)   Collection Time: 07/04/22  8:31 PM   Specimen: Urine, Clean Catch  Result Value Ref Range Status   Specimen Description URINE, CLEAN CATCH  Final   Special Requests   Final    NONE Reflexed from [id number] Performed at [facility], [address], [place], [place] [address]    Culture PENDING  Incomplete   Report Status PENDING  Incomplete     Radiological Exams on Admission: CT ABDOMEN PELVIS WO CONTRAST  Result Date: 07/04/2022 CLINICAL DATA:  Sepsis abdominal pain EXAM: CT ABDOMEN AND PELVIS WITHOUT CONTRAST TECHNIQUE: Multidetector CT imaging of the abdomen and pelvis was performed following the standard protocol without IV contrast. RADIATION DOSE REDUCTION: This exam was performed according to the departmental dose-optimization program which includes automated exposure control, adjustment of the mA and/or kV according to patient size and/or use of iterative reconstruction technique. COMPARISON:  CT 07/10/2021 FINDINGS: Lower chest: Lung bases demonstrate no acute airspace disease. Atelectasis or scarring at the bases. Coronary vascular calcification. Hepatobiliary: No focal liver abnormality is seen. No gallstones, gallbladder wall thickening, or biliary dilatation. Pancreas: Unremarkable. No pancreatic ductal dilatation or surrounding inflammatory changes. Spleen: Normal in size without focal abnormality. Adrenals/Urinary Tract: Status post right adrenalectomy. Left adrenal gland within normal limits. Kidneys show no hydronephrosis. The bladder is unremarkable Stomach/Bowel: The stomach is nonenlarged. No dilated small bowel. No acute bowel wall thickening. Negative appendix. Vascular/Lymphatic: Advanced aortic atherosclerosis. No aneurysm. No suspicious lymph nodes. Reproductive: Hysterectomy. Small gas and hyperdense material in the vagina. No adnexal mass Other: Negative for pelvic effusion or free air. Anterior abdominal wall laxity. Ventral scar. Heterogeneous  soft tissue density in the subcutaneous soft tissues measuring 11 mm to the right of midline, series 3, image 74 and along the low anterior pelvis, this measures 3.6 by 2.1 cm. Musculoskeletal: No acute  osseous abnormality. IMPRESSION: 1. No CT evidence for acute intra-abdominal or pelvic abnormality. 2. Status post right adrenalectomy. 3. Heterogeneous soft tissue density in the subcutaneous soft tissues of the anterior abdominal wall and along the low anterior pelvis, grossly stable and potentially due to prominent soft tissue scarring 4. Aortic atherosclerosis. Aortic Atherosclerosis (ICD10-I70.0). Electronically Signed   By: Jasmine Pang M.D.   On: 07/04/2022 21:22   DG Chest Port 1 View  Result Date: 07/04/2022 CLINICAL DATA:  Possible sepsis EXAM: PORTABLE CHEST 1 VIEW COMPARISON:  05/22/2022 FINDINGS: Post sternotomy changes. No acute airspace disease or effusion. Stable cardiomediastinal silhouette. No pneumothorax IMPRESSION: No active disease. Electronically Signed   By: Jasmine Pang M.D.   On: 07/04/2022 20:05    Assessment/Plan   1. UTI  - Does not appear to be septic on admission (only 1 SIRS criteria present, qSOFA score of 1)  - Continue empiric antibiotics, follow cultures and clinical course    2. ?Allergic reaction  - Pt developed upper body erythema and pruritus while vancomycin was infusing  - BP remained normal, there was no GI distress, and no appreciable angioedema  - She was treated for anaphylaxis in ED but suspect this was a vancomycin infusion reaction; pharmacy was notified by primary RN    3. Insulin-dependent DM  - A1c was 11.3% in April 2024  - Check CBGs and continue long- and short-acting insulin   4. Hypertension  - SBP was 80s with EMS and 90s initially in ED, will hold antihypertensives on admission and resume as appropriate    5. Hyperthyroidism  - Continue Tapazole   6. CAD  - No anginal complaints  - Continue Lipitor and Plavix   7. Hx of CVA  with spastic hemiplegia  - Continue Lipitor, Plavix, baclofen   8. Anxiety, depression  - Continue Lexapro, Ativan    DVT prophylaxis: Lovenox  Code Status: Full   Level of Care: Level of care: Progressive Family Communication: none present  Disposition Plan:  Patient is from: Home  Anticipated d/c is to: TBD Anticipated d/c date is: 07/07/22  Patient currently: Pending stable BP, clearance of lactate, transition to oral antibiotics Consults called: None  Admission status: Inpatient     Briscoe Deutscher, MD [facility]  07/05/2022, 12:22 AM

## 2022-07-04 NOTE — Progress Notes (Signed)
Per bedside RN Blood Cultures drawn before first abx administered.

## 2022-07-04 NOTE — Progress Notes (Signed)
Pharmacy Antibiotic Note  Leonida Hufnagle is a 52 y.o. female for which pharmacy has been consulted for cefepime and vancomycin dosing for sepsis.  Penicillin allergy noted. Patient tolerated multiple cephalosporin doses. Aztreonam changed to cefepime per pharmacy protocol order.  SCr 1.06 WBC 8.6; LA 2.4; T 98.4; HR 114; RR 18 COVID neg / flu neg  Plan: Aztreonam x 1 given in ED Metronidazole per MD Cefepime 2g q8hr  Vancomycin 1750 mg once then 1000 mg q24hr (eAUC 516.6) unless change in renal function Trend WBC, Fever, Renal function F/u cultures, clinical course, WBC De-escalate when able Levels at steady state  Height: 5\' 2"  (157.5 cm) Weight: 85.3 kg (188 lb) IBW/kg (Calculated) : 50.1  Temp (24hrs), Avg:97.8 F (36.6 C), Min:97.8 F (36.6 C), Max:97.8 F (36.6 C)  No results for input(s): "WBC", "CREATININE", "LATICACIDVEN", "VANCOTROUGH", "VANCOPEAK", "VANCORANDOM", "GENTTROUGH", "GENTPEAK", "GENTRANDOM", "TOBRATROUGH", "TOBRAPEAK", "TOBRARND", "AMIKACINPEAK", "AMIKACINTROU", "AMIKACIN" in the last 168 hours.  CrCl cannot be calculated (Patient's most recent lab result is older than the maximum 21 days allowed.).    Allergies  Allergen Reactions   Contrast Media [Iodinated Contrast Media] Anaphylaxis and Swelling   Penicillins Swelling    Mouth swells up and eyes swollen shut   Sumatriptan Anaphylaxis   Aspirin Swelling   Ciprofloxacin     Feel nausea/vomitting   Morphine Swelling    Microbiology results: Pending  Thank you for allowing pharmacy to be a part of this patient's care.  Delmar Landau, PharmD, BCPS 07/04/2022 7:16 PM ED Clinical Pharmacist -  [phone]

## 2022-07-04 NOTE — ED Notes (Signed)
Delay in sepsis time goals was due to previous difficulty with IV placement, Ultrasound IV had to be done by this current nurse

## 2022-07-04 NOTE — ED Triage Notes (Signed)
Pt coming from home with 3 days of feeling dizzy. Pt also found to be hypotensive with EMS. 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 Hx of UTI's

## 2022-07-04 NOTE — Progress Notes (Signed)
Elink monitoring for the code sepsis protocol.  

## 2022-07-05 DIAGNOSIS — E1169 Type 2 diabetes mellitus with other specified complication: Secondary | ICD-10-CM | POA: Diagnosis not present

## 2022-07-05 DIAGNOSIS — N3000 Acute cystitis without hematuria: Secondary | ICD-10-CM | POA: Diagnosis not present

## 2022-07-05 DIAGNOSIS — J4520 Mild intermittent asthma, uncomplicated: Secondary | ICD-10-CM

## 2022-07-05 DIAGNOSIS — I10 Essential (primary) hypertension: Secondary | ICD-10-CM | POA: Diagnosis not present

## 2022-07-05 DIAGNOSIS — F419 Anxiety disorder, unspecified: Secondary | ICD-10-CM | POA: Diagnosis not present

## 2022-07-05 LAB — BASIC METABOLIC PANEL
Anion gap: 13 (ref 5–15)
BUN: 19 mg/dL (ref 6–20)
CO2: 18 mmol/L — ABNORMAL LOW (ref 22–32)
Calcium: 8.3 mg/dL — ABNORMAL LOW (ref 8.9–10.3)
Chloride: 106 mmol/L (ref 98–111)
Creatinine, Ser: 0.97 mg/dL (ref 0.44–1.00)
GFR, Estimated: >60 mL/min (ref >60)
Glucose, Bld: 197 mg/dL — ABNORMAL HIGH (ref 70–99)
Potassium: 3.5 mmol/L (ref 3.5–5.1)
Sodium: 137 mmol/L (ref 135–145)

## 2022-07-05 LAB — CBG MONITORING, ED
Glucose-Capillary: 160 mg/dL — ABNORMAL HIGH (ref 70–99)
Glucose-Capillary: 281 mg/dL — ABNORMAL HIGH (ref 70–99)

## 2022-07-05 LAB — CBC
HCT: 37 % (ref 36.0–46.0)
Hemoglobin: 11.9 g/dL — ABNORMAL LOW (ref 12.0–15.0)
MCH: 26.4 pg (ref 26.0–34.0)
MCHC: 32.2 g/dL (ref 30.0–36.0)
MCV: 82.2 fL (ref 80.0–100.0)
Platelets: 162 10*3/uL (ref 150–400)
RBC: 4.5 MIL/uL (ref 3.87–5.11)
RDW: 15 % (ref 11.5–15.5)
WBC: 11.1 10*3/uL — ABNORMAL HIGH (ref 4.0–10.5)
nRBC: 0 % (ref 0.0–0.2)

## 2022-07-05 LAB — GLUCOSE, CAPILLARY
Glucose-Capillary: 360 mg/dL — ABNORMAL HIGH (ref 70–99)
Glucose-Capillary: 309 mg/dL — ABNORMAL HIGH (ref 70–99)
Glucose-Capillary: 274 mg/dL — ABNORMAL HIGH (ref 70–99)

## 2022-07-05 LAB — CULTURE, BLOOD (ROUTINE X 2)

## 2022-07-05 LAB — LACTIC ACID, PLASMA
Lactic Acid, Venous: 1.7 mmol/L (ref 0.5–1.9)
Lactic Acid, Venous: 3.6 mmol/L (ref 0.5–1.9)
Lactic Acid, Venous: 4.3 mmol/L (ref 0.5–1.9)

## 2022-07-05 MED ORDER — ACETAMINOPHEN 650 MG RE SUPP: 650.0000 mg | Freq: Four times a day (QID) | RECTAL | Status: DC | PRN

## 2022-07-05 MED ORDER — ACETAMINOPHEN 325 MG PO TABS
650.0000 mg | ORAL_TABLET | Freq: Four times a day (QID) | ORAL | Status: DC | PRN
  Administered 2022-07-09: 650 mg via ORAL
  Filled 2022-07-05: qty 2

## 2022-07-05 MED ORDER — LACTATED RINGERS IV SOLN: INTRAVENOUS | Status: DC

## 2022-07-05 MED ORDER — METOPROLOL TARTRATE 50 MG PO TABS
50.0000 mg | ORAL_TABLET | Freq: Two times a day (BID) | ORAL | Status: DC
  Administered 2022-07-05 – 2022-07-10 (×11): 50 mg via ORAL
  Filled 2022-07-05 (×10): qty 1
  Filled 2022-07-05: qty 2

## 2022-07-05 MED ORDER — INSULIN ASPART 100 UNIT/ML IJ SOLN
6.0000 [IU] | Freq: Three times a day (TID) | INTRAMUSCULAR | Status: DC
  Administered 2022-07-06 (×3): 6 [IU] via SUBCUTANEOUS

## 2022-07-05 MED ORDER — GUAIFENESIN 100 MG/5ML PO LIQD: 5.0000 mL | ORAL | Status: DC | PRN

## 2022-07-05 MED ORDER — DIPHENHYDRAMINE HCL 25 MG PO CAPS
25.0000 mg | ORAL_CAPSULE | Freq: Four times a day (QID) | ORAL | Status: DC | PRN
  Administered 2022-07-05: 25 mg via ORAL
  Filled 2022-07-05: qty 1

## 2022-07-05 MED ORDER — INSULIN GLARGINE-YFGN 100 UNIT/ML ~~LOC~~ SOLN
40.0000 [IU] | Freq: Every day | SUBCUTANEOUS | Status: DC
  Administered 2022-07-05 – 2022-07-07 (×3): 40 [IU] via SUBCUTANEOUS
  Filled 2022-07-05 (×6): qty 0.4

## 2022-07-05 NOTE — Progress Notes (Addendum)
Pt arrived to rm 22 from ED. CHG wipe given. Initiated tele. Oriented pt to the unit. Obtained vs. Pt's HR =130s. MD aware (see the note). Call bell within reach.   Lawson Radar, RN

## 2022-07-05 NOTE — ED Notes (Signed)
Pt asleep in room.

## 2022-07-05 NOTE — Inpatient Diabetes Management (Addendum)
Inpatient Diabetes Program Recommendations  AACE/ADA: New Consensus Statement on Inpatient Glycemic Control (2015)  Target Ranges:  Prepandial:   less than 140 mg/dL      Peak postprandial:   less than 180 mg/dL (1-2 hours)      Critically ill patients:  140 - 180 mg/dL   Lab Results  Component Value Date   GLUCAP 360 (H) 07/05/2022   HGBA1C 11.3 (H) 05/20/2022    Review of Glycemic Control  Latest Reference Range & Units 07/05/22 07:54 07/05/22 12:19  Glucose-Capillary 70 - 99 mg/dL 161 (H) 096 (H)  (H): Data is abnormally high Diabetes history: Type 2 DM Outpatient Diabetes medications: Lantus 40 units QHS (NT), Novolog 15 units TID (NT) Current orders for Inpatient glycemic control: Semglee 40 units QHS, Novolog 3 units TID, Novolog 0-9 units TID& HS Solumedrol 125 mg x 1  Inpatient Diabetes Program Recommendations:   Consider further increasing Novolog to 6 units TID (assuming patient is consuming >50% of meals) and changing diet to carb modified.  Spoke with patient regarding outpatient diabetes management. Patient states, "I haven't been taking my insulin because I cannot afford it." Reviewed patient's current A1c of 11.3%. Explained what a A1c is and what it measures. Also reviewed goal A1c with patient, importance of good glucose control @ home, and blood sugar goals. Reviewed patho of DM, need for improvement to glycemic control, vascular changes, risk for recurrent infections, Novolin 70/30 to aide in affordability and other commorbidities.  Patient was not checking CBGs. Reviewed recommended frequency and encouraged follow up with PCP.  Patient begins to discuss the importance of having someone else "take care of her" Requesting SNF. Will place [facility] consult to further assist.  At discharge, pending disposition may need to consider switching to Novolin 70/30. Patient able to afford.  Thanks, Lujean Rave, MSN, RNC-OB Diabetes Coordinator [phone] (8a-5p)

## 2022-07-05 NOTE — Hospital Course (Addendum)
Lori [name] is a 52 y.o. female with medical history significant for CAD status post CABG, CVA with spastic hemiplegia, hypertension, insulin-dependent diabetes mellitus, hypothyroidism, depression, anxiety, pheochromocytoma status post adrenalectomy presented to hospital with lightheadedness, dysuria, abdominal discomfort with mild cough.  EMS was called in and was noted to be hypotensive with blood pressure in the 80s.  In the ED, patient had systolic blood pressure of 95.  CT scan of the abdomen and pelvis was negative for acute findings.  Labs showed lactic acid elevated at 2.4 with no leukocytosis and creatinine at 1.0.  COVID influenza and RSV was negative.  Urinalysis showed nitrate positive with leukocytes 11-20.  Initial lactate was elevated at 2.4.  Blood and urine cultures were collected from the ED and patient received 2 L of Ringer lactate.  Patient was empirically treated with vancomycin azactam Flagyl and had some erythema while infusing vancomycin which was treated with IM epinephrine Solu-Medrol Pepcid and Benadryl.  Patient was then admitted hospital for further evaluation and treatment.   Assessment/Plan   UTI  Continue azactam.  Follow blood and urine cultures, pending.  Patient still tachycardic.  lactate trended up.  Mild elevation in WBC at 11.1.  Could be steroid induced.  Continue IV Rocephin. ??IV fluids???  Erythema/pruritus while on vancomycin. Possibility of vancomycin due to "red man" syndrome.  Was treated for possible allergy in the ED.   Insulin-dependent DM  Latest hemoglobin A1c was 11.3% in April 2024 continue long-acting and short acting insulin.  Monitor blood glucose levels.   Essential hypertension  - SBP was 80s with EMS and 90s initially in ED.  Patient has received IV fluid boluses with improvement in blood pressure at 110/91 at this time.,   Hyperthyroidism  - Continue Tapazole    CAD  Continue Lipitor and Plavix.   Hx of CVA with spastic  hemiplegia  - Continue Lipitor, Plavix, baclofen continue supportive care.   Anxiety, depression  - Continue Lexapro, Ativan  Obesity.  Body mass index is 34.39 kg/m.  Benefit from weight loss as outpatient.

## 2022-07-05 NOTE — ED Notes (Addendum)
Provided pt with coffee, cranberry juice, and ice water. Pt states that she can't breathe when she sits up, even though she will request to sit up frequently. Reminded pt that she told me that, and that we cannot have her sit up at 90 degrees if that is the case. Set pt at approx 60 degrees to enjoy drinks.

## 2022-07-05 NOTE — Progress Notes (Addendum)
PROGRESS NOTE    Lori [name]  NWG:[id number] DOB: Feb 03, 1971 DOA: 07/04/2022 PCP: Caesar Bookman, NP    Brief Narrative:  Lori [name] is a 52 y.o. female with medical history significant for CAD status post CABG, CVA with spastic hemiplegia, hypertension, insulin-dependent diabetes mellitus, hypothyroidism, depression, anxiety, pheochromocytoma status post adrenalectomy presented to hospital with lightheadedness, dysuria, abdominal discomfort with mild cough.  EMS was called in and was noted to be hypotensive with blood pressure in the 80s.  In the ED, patient had systolic blood pressure of 95.  CT scan of the abdomen and pelvis was negative for acute findings.  Labs showed lactic acid elevated at 2.4 with no leukocytosis and creatinine at 1.0.  COVID influenza and RSV was negative.  Urinalysis showed nitrate positive with leukocytes 11-20.  Initial lactate was elevated at 2.4.  Blood and urine cultures were collected from the ED and patient received 2 L of Ringer lactate.  Patient was empirically treated with vancomycin azactam Flagyl and had some erythema while infusing vancomycin which was treated with IM epinephrine Solu-Medrol Pepcid and Benadryl.  Patient was then admitted hospital for further evaluation and treatment.   Assessment/Plan   UTI  Continue Rocephin IV. follow blood and urine cultures, pending.  Patient still tachycardic.  lactate trended up.  Mild elevation in WBC at 11.1.  Could be steroid induced.  Erythema/pruritus while on vancomycin. Possibility of vancomycin due to "red man" syndrome.  Was treated for possible allergy in the ED. continue Benadryl.   Insulin-dependent DM with hyperglycemia Latest hemoglobin A1c was 11.3% in April 2024 continue long-acting and short acting insulin.  Monitor blood glucose levels. Diabetic coordinator on board. Will follow recommendations.    Essential hypertension  SBP was 80s with EMS and 90s initially in ED.  Patient has  received IV fluid boluses with improvement in blood pressure at 110/91 at this time.,  Will restart metoprolol due to tachycardia.   Hyperthyroidism  - Continue Tapazole    CAD  Continue Lipitor and Plavix.   Hx of CVA with spastic hemiplegia  - Continue Lipitor, Plavix, baclofen continue supportive care.  Will get PT evaluation.   Anxiety, depression  - Continue Lexapro, Ativan  Obesity.  Body mass index is 34.39 kg/m.  Benefit from weight loss as outpatient.    DVT prophylaxis: enoxaparin (LOVENOX) injection 40 mg Start: 07/05/22 1000   Code Status:     Code Status: Full Code  Disposition: Home likely in 1 to 2 days patient lives with her husband at home.  Status is: Inpatient Remains inpatient appropriate because: IV antibiotic, UTI,   Family Communication: I tried to call the patient's spouse and daughter on the phone but was unable to reach them today.  Consultants:  None  Procedures:  None  Antimicrobials:  Rocephin IV  Anti-infectives (From admission, onward)    Start     Dose/Rate Route Frequency Ordered Stop   07/05/22 2300  vancomycin (VANCOCIN) IVPB 1000 mg/200 mL premix  Status:  Discontinued        1,000 mg 200 mL/hr over 60 Minutes Intravenous Every 24 hours 07/04/22 2210 07/04/22 2325   07/05/22 0400  ceFEPIme (MAXIPIME) 2 g in sodium chloride 0.9 % 100 mL IVPB  Status:  Discontinued        2 g 200 mL/hr over 30 Minutes Intravenous Every 8 hours 07/04/22 2205 07/04/22 2325   07/05/22 0000  cefTRIAXone (ROCEPHIN) 2 g in sodium chloride 0.9 % 100 mL  IVPB        2 g 200 mL/hr over 30 Minutes Intravenous Daily at bedtime 07/04/22 2325 07/11/22 2159   07/04/22 1930  vancomycin (VANCOREADY) IVPB 1750 mg/350 mL        1,750 mg 175 mL/hr over 120 Minutes Intravenous  Once 07/04/22 1915 07/04/22 2328   07/04/22 1915  aztreonam (AZACTAM) 2 g in sodium chloride 0.9 % 100 mL IVPB        2 g 200 mL/hr over 30 Minutes Intravenous  Once 07/04/22 1903 07/04/22  2023   07/04/22 1915  metroNIDAZOLE (FLAGYL) IVPB 500 mg        500 mg 100 mL/hr over 60 Minutes Intravenous  Once 07/04/22 1903 07/04/22 2215   07/04/22 1915  vancomycin (VANCOCIN) IVPB 1000 mg/200 mL premix  Status:  Discontinued        1,000 mg 200 mL/hr over 60 Minutes Intravenous  Once 07/04/22 1903 07/04/22 1915       Subjective: Today, patient was seen and examined at bedside.  Complains of mild headache and sore throat.  Has dysuria.  Objective: Vitals:   07/05/22 0915 07/05/22 1006 07/05/22 1007 07/05/22 1108  BP:  (!) 128/96  (!) 128/91  Pulse: (!) 112 (!) 120 (!) 119 (!) 138  Resp: 20 (!) 22 19   Temp:      TempSrc:      SpO2: 100% 100% 100%   Weight:      Height:       No intake or output data in the 24 hours ending 07/05/22 1111 Filed Weights   07/04/22 1849  Weight: 85.3 kg    Physical Examination: Body mass index is 34.39 kg/m.  General: Obese built, not in obvious distress, alert awake and Communicative.  Mild expressive dysarthria. HENT:   No scleral pallor or icterus noted. Oral mucosa is moist.  Oral cavity without obvious findings. Chest:  Clear breath sounds.  No crackles or wheezes.  CVS: S1 &S2 heard. No murmur.  Regular rate and rhythm. Abdomen: Soft, nontender, nondistended.  Bowel sounds are heard.   Extremities: No cyanosis, clubbing or edema.  Peripheral pulses are palpable. Psych: Alert, awake and oriented, mildly anxious. CNS: Dysarthria.  Left upper extremity spastic weakness.  Moves lower extremities. Skin: Warm and dry.  No rashes noted.  Data Reviewed:   CBC: Recent Labs  Lab 07/04/22 2122 07/05/22 0121  WBC 8.6 11.1*  NEUTROABS 5.1  --   HGB 11.8* 11.9*  HCT 36.1 37.0  MCV 80.9 82.2  PLT 361 162    Basic Metabolic Panel: Recent Labs  Lab 07/04/22 1954 07/05/22 0121  NA 137 137  K 4.0 3.5  CL 98 106  CO2 24 18*  GLUCOSE 197* 197*  BUN 22* 19  CREATININE 1.06* 0.97  CALCIUM 9.4 8.3*    Liver Function  Tests: Recent Labs  Lab 07/04/22 1954  AST 15  ALT 17  ALKPHOS 117  BILITOT 0.6  PROT 8.1  ALBUMIN 3.7     Radiology Studies: CT ABDOMEN PELVIS WO CONTRAST  Result Date: 07/04/2022 CLINICAL DATA:  Sepsis abdominal pain EXAM: CT ABDOMEN AND PELVIS WITHOUT CONTRAST TECHNIQUE: Multidetector CT imaging of the abdomen and pelvis was performed following the standard protocol without IV contrast. RADIATION DOSE REDUCTION: This exam was performed according to the departmental dose-optimization program which includes automated exposure control, adjustment of the mA and/or kV according to patient size and/or use of iterative reconstruction technique. COMPARISON:  CT 07/10/2021 FINDINGS: Lower  chest: Lung bases demonstrate no acute airspace disease. Atelectasis or scarring at the bases. Coronary vascular calcification. Hepatobiliary: No focal liver abnormality is seen. No gallstones, gallbladder wall thickening, or biliary dilatation. Pancreas: Unremarkable. No pancreatic ductal dilatation or surrounding inflammatory changes. Spleen: Normal in size without focal abnormality. Adrenals/Urinary Tract: Status post right adrenalectomy. Left adrenal gland within normal limits. Kidneys show no hydronephrosis. The bladder is unremarkable Stomach/Bowel: The stomach is nonenlarged. No dilated small bowel. No acute bowel wall thickening. Negative appendix. Vascular/Lymphatic: Advanced aortic atherosclerosis. No aneurysm. No suspicious lymph nodes. Reproductive: Hysterectomy. Small gas and hyperdense material in the vagina. No adnexal mass Other: Negative for pelvic effusion or free air. Anterior abdominal wall laxity. Ventral scar. Heterogeneous soft tissue density in the subcutaneous soft tissues measuring 11 mm to the right of midline, series 3, image 74 and along the low anterior pelvis, this measures 3.6 by 2.1 cm. Musculoskeletal: No acute osseous abnormality. IMPRESSION: 1. No CT evidence for acute intra-abdominal  or pelvic abnormality. 2. Status post right adrenalectomy. 3. Heterogeneous soft tissue density in the subcutaneous soft tissues of the anterior abdominal wall and along the low anterior pelvis, grossly stable and potentially due to prominent soft tissue scarring 4. Aortic atherosclerosis. Aortic Atherosclerosis (ICD10-I70.0). Electronically Signed   By: Jasmine Pang M.D.   On: 07/04/2022 21:22   DG Chest Port 1 View  Result Date: 07/04/2022 CLINICAL DATA:  Possible sepsis EXAM: PORTABLE CHEST 1 VIEW COMPARISON:  05/22/2022 FINDINGS: Post sternotomy changes. No acute airspace disease or effusion. Stable cardiomediastinal silhouette. No pneumothorax IMPRESSION: No active disease. Electronically Signed   By: Jasmine Pang M.D.   On: 07/04/2022 20:05      LOS: 1 day    Joycelyn Das, MD [facility] Available via Epic secure chat 7am-7pm After these hours, please refer to coverage provider listed on [email or web address] 07/05/2022, 11:11 AM

## 2022-07-05 NOTE — ED Notes (Signed)
ED TO INPATIENT HANDOFF REPORT  ED Nurse Name and Phone #:  Waylon Hershey [phone]  S Name/Age/Gender Lori [name] 52 y.o. female Room/Bed: 006C/006C  Code Status   Code Status: Full Code  Home/SNF/Other Home Patient oriented to: self, place, time, and situation Is this baseline? Yes   Triage Complete: Triage complete  Chief Complaint Sepsis secondary to UTI (HCC) [A41.9, N39.0]  Triage Note Pt coming from home with 3 days of feeling dizzy. Pt also found to be hypotensive with EMS. 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 Hx of UTI's   Allergies Allergies  Allergen Reactions   Contrast Media [Iodinated Contrast Media] Anaphylaxis and Swelling   Penicillins Swelling    Mouth swells up and eyes swollen shut   Sumatriptan Anaphylaxis   Aspirin Swelling   Ciprofloxacin     Feel nausea/vomitting   Morphine Swelling    Level of Care/Admitting Diagnosis ED Disposition     ED Disposition  Admit   Condition  --   Comment  Hospital Area: [facility] [[id number]]  Level of Care: Progressive [102]  Admit to Progressive based on following criteria: MULTISYSTEM THREATS such as stable sepsis, metabolic/electrolyte imbalance with or without encephalopathy that is responding to early treatment.  May admit patient to [facility] or [facility] if equivalent level of care is available:: Yes  Covid Evaluation: Confirmed COVID Negative  Diagnosis: Sepsis secondary to UTI [facility]) [[id number]]  Admitting Physician: Briscoe Deutscher [[id number]]  Attending Physician: Briscoe Deutscher [[id number]]  Certification:: I certify this patient will need inpatient services for at least 2 midnights  Estimated Length of Stay: 3          B Medical/Surgery History Past Medical History:  Diagnosis Date   CAD (coronary artery disease)    Depression    Diabetes mellitus without complication (HCC)    History of CT scan    History of mammogram    History of MRI    Hypertension     Pheochromocytoma    Pheochromocytoma    Stroke (HCC)    Thyroid disease    Past Surgical History:  Procedure Laterality Date   ABDOMINAL HYSTERECTOMY     CESAREAN SECTION     3   CORONARY ARTERY BYPASS GRAFT     Right adrenal gland removal for pheochromocytoma Right      A IV Location/Drains/Wounds Patient Lines/Drains/Airways Status     Active Line/Drains/Airways     Name Placement date Placement time Site Days   Peripheral IV 07/04/22 20 G 1.88" Right Antecubital 07/04/22  1945  Antecubital  1   Wound / Incision (Open or Dehisced) 11/02/21 Irritant Dermatitis (Moisture Associated Skin Damage) Groin Right;Left;Mid Redness, skin irritation, sloughing 11/02/21  1545  Groin  245            Intake/Output Last 24 hours No intake or output data in the 24 hours ending 07/05/22 0947  Labs/Imaging Results for orders placed or performed during the hospital encounter of 07/04/22 (from the past 48 hour(s))  Lactic acid, plasma     Status: Abnormal   Collection Time: 07/04/22  7:54 PM  Result Value Ref Range   Lactic Acid, Venous 2.4 (HH) 0.5 - 1.9 mmol/L    Comment: CRITICAL RESULT CALLED TO, READ BACK BY AND VERIFIED WITH Jolyn Nap, RN. [id number] 07/04/22. LPAIT Performed at [facility], [address], [place], [place] [address]   Comprehensive metabolic panel  Status: Abnormal   Collection Time: 07/04/22  7:54 PM  Result Value Ref Range   Sodium 137 135 - 145 mmol/L   Potassium 4.0 3.5 - 5.1 mmol/L   Chloride 98 98 - 111 mmol/L   CO2 24 22 - 32 mmol/L   Glucose, Bld 197 (H) 70 - 99 mg/dL    Comment: Glucose reference range applies only to samples taken after fasting for at least 8 hours.   BUN 22 (H) 6 - 20 mg/dL   Creatinine, Ser 9.60 (H) 0.44 - 1.00 mg/dL   Calcium 9.4 8.9 - 45.4 mg/dL   Total Protein 8.1 6.5 - 8.1 g/dL   Albumin 3.7 3.5 - 5.0 g/dL   AST 15 15 - 41 U/L   ALT 17 0 - 44 U/L   Alkaline Phosphatase 117 38 - 126 U/L   Total Bilirubin 0.6 0.3 - 1.2  mg/dL   GFR, Estimated >09 >81 mL/min    Comment: (NOTE) Calculated using the CKD-EPI Creatinine Equation (2021)    Anion gap 15 5 - 15    Comment: Performed at [facility], [address] [place], [place] [address]  Protime-INR     Status: None   Collection Time: 07/04/22  7:54 PM  Result Value Ref Range   Prothrombin Time 13.4 11.4 - 15.2 seconds   INR 1.0 0.8 - 1.2    Comment: (NOTE) INR goal varies based on device and disease states. Performed at [facility], [address], [place], [place] [address]   APTT     Status: None   Collection Time: 07/04/22  7:54 PM  Result Value Ref Range   aPTT 27 24 - 36 seconds    Comment: Performed at [facility], [address] [place], [place] [address]  Blood Culture (routine x 2)     Status: None (Preliminary result)   Collection Time: 07/04/22  7:54 PM   Specimen: BLOOD  Result Value Ref Range   Specimen Description BLOOD SITE NOT SPECIFIED    Special Requests      BOTTLES DRAWN AEROBIC AND ANAEROBIC Blood Culture adequate volume   Culture      NO GROWTH < 12 HOURS Performed at [facility], [address] [place], [place] [address]    Report Status PENDING   Lipase, blood     Status: None   Collection Time: 07/04/22  7:54 PM  Result Value Ref Range   Lipase 23 11 - 51 U/L    Comment: Performed at [facility], [address], [place], [place] [address]  Resp panel by RT-PCR (RSV, Flu A&B, Covid) Anterior Nasal Swab     Status: None   Collection Time: 07/04/22  8:31 PM   Specimen: Anterior Nasal Swab  Result Value Ref Range   SARS Coronavirus 2 by RT PCR NEGATIVE NEGATIVE   Influenza A by PCR NEGATIVE NEGATIVE   Influenza B by PCR NEGATIVE NEGATIVE    Comment: (NOTE) The Xpert Xpress SARS-CoV-2/FLU/RSV plus assay is intended as an aid in the diagnosis of influenza from Nasopharyngeal swab specimens and should not be used as a sole basis for treatment. Nasal washings and aspirates are  unacceptable for Xpert Xpress SARS-CoV-2/FLU/RSV testing.  Fact Sheet for Patients: BloggerCourse.com  Fact Sheet for Healthcare Providers: SeriousBroker.it  This test is not yet approved or cleared by the [place] FDA and has been authorized for detection and/or diagnosis of SARS-CoV-2 by FDA under  an Emergency Use Authorization (EUA). This EUA will remain in effect (meaning this test can be used) for the duration of the COVID-19 declaration under Section 564(b)(1) of the Act, 21 U.S.C. section 360bbb-3(b)(1), unless the authorization is terminated or revoked.     Resp Syncytial Virus by PCR NEGATIVE NEGATIVE    Comment: (NOTE) Fact Sheet for Patients: BloggerCourse.com  Fact Sheet for Healthcare Providers: SeriousBroker.it  This test is not yet approved or cleared by the [place] FDA and has been authorized for detection and/or diagnosis of SARS-CoV-2 by FDA under an Emergency Use Authorization (EUA). This EUA will remain in effect (meaning this test can be used) for the duration of the COVID-19 declaration under Section 564(b)(1) of the Act, 21 U.S.C. section 360bbb-3(b)(1), unless the authorization is terminated or revoked.  Performed at [facility], [address], [place], [place] [address]   Urinalysis, w/ Reflex to Culture (Infection Suspected) -Urine, Clean Catch     Status: Abnormal   Collection Time: 07/04/22  8:31 PM  Result Value Ref Range   Specimen Source URINE, CLEAN CATCH    Color, Urine YELLOW YELLOW   APPearance CLEAR CLEAR   Specific Gravity, Urine 1.010 1.005 - 1.030   pH 5.0 5.0 - 8.0   Glucose, UA NEGATIVE NEGATIVE mg/dL   Hgb urine dipstick NEGATIVE NEGATIVE   Bilirubin Urine NEGATIVE NEGATIVE   Ketones, ur NEGATIVE NEGATIVE mg/dL   Protein, ur NEGATIVE NEGATIVE mg/dL   Nitrite POSITIVE (A) NEGATIVE   Leukocytes,Ua MODERATE (A)  NEGATIVE   RBC / HPF 0-5 0 - 5 RBC/hpf   WBC, UA 11-20 0 - 5 WBC/hpf    Comment:        Reflex urine culture not performed if WBC <=10, OR if Squamous epithelial cells >5. If Squamous epithelial cells >5 suggest recollection.    Bacteria, UA RARE (A) NONE SEEN   Squamous Epithelial / HPF 0-5 0 - 5 /HPF    Comment: Performed at [facility], [address] [place], [place] [address]  Urine Culture     Status: None (Preliminary result)   Collection Time: 07/04/22  8:31 PM   Specimen: Urine, Clean Catch  Result Value Ref Range   Specimen Description URINE, CLEAN CATCH    Special Requests      NONE Reflexed from [id number] Performed at [facility], [address] [place], [place] [address]    Culture PENDING    Report Status PENDING   CBC with Differential/Platelet     Status: Abnormal   Collection Time: 07/04/22  9:22 PM  Result Value Ref Range   WBC 8.6 4.0 - 10.5 K/uL   RBC 4.46 3.87 - 5.11 MIL/uL   Hemoglobin 11.8 (L) 12.0 - 15.0 g/dL   HCT 69.6 29.5 - 28.4 %   MCV 80.9 80.0 - 100.0 fL   MCH 26.5 26.0 - 34.0 pg   MCHC 32.7 30.0 - 36.0 g/dL   RDW 13.2 44.0 - 10.2 %   Platelets 361 150 - 400 K/uL   nRBC 0.0 0.0 - 0.2 %   Neutrophils Relative % 58 %   Neutro Abs 5.1 1.7 - 7.7 K/uL   Lymphocytes Relative 34 %   Lymphs Abs 2.9 0.7 - 4.0 K/uL   Monocytes Relative 5 %   Monocytes Absolute 0.4 0.1 - 1.0 K/uL   Eosinophils Relative 2 %   Eosinophils Absolute 0.2 0.0 - 0.5 K/uL   Basophils Relative 1 %  Basophils Absolute 0.1 0.0 - 0.1 K/uL   Immature Granulocytes 0 %   Abs Immature Granulocytes 0.02 0.00 - 0.07 K/uL    Comment: Performed at [facility], [address] [place], [place] [address]  Lactic acid, plasma     Status: None   Collection Time: 07/04/22 11:58 PM  Result Value Ref Range   Lactic Acid, Venous 1.7 0.5 - 1.9 mmol/L    Comment: Performed at [facility], [address], [place], [place] [address]  CBG monitoring, ED     Status:  Abnormal   Collection Time: 07/05/22  1:02 AM  Result Value Ref Range   Glucose-Capillary 160 (H) 70 - 99 mg/dL    Comment: Glucose reference range applies only to samples taken after fasting for at least 8 hours.   Comment 1 Notify RN    Comment 2 Document in Chart   Basic metabolic panel     Status: Abnormal   Collection Time: 07/05/22  1:21 AM  Result Value Ref Range   Sodium 137 135 - 145 mmol/L   Potassium 3.5 3.5 - 5.1 mmol/L   Chloride 106 98 - 111 mmol/L   CO2 18 (L) 22 - 32 mmol/L   Glucose, Bld 197 (H) 70 - 99 mg/dL    Comment: Glucose reference range applies only to samples taken after fasting for at least 8 hours.   BUN 19 6 - 20 mg/dL   Creatinine, Ser 8.29 0.44 - 1.00 mg/dL   Calcium 8.3 (L) 8.9 - 10.3 mg/dL   GFR, Estimated >56 >21 mL/min    Comment: (NOTE) Calculated using the CKD-EPI Creatinine Equation (2021)    Anion gap 13 5 - 15    Comment: Performed at [facility], [address] [place], [place] [address]  CBC     Status: Abnormal   Collection Time: 07/05/22  1:21 AM  Result Value Ref Range   WBC 11.1 (H) 4.0 - 10.5 K/uL   RBC 4.50 3.87 - 5.11 MIL/uL   Hemoglobin 11.9 (L) 12.0 - 15.0 g/dL   HCT 78.4 69.6 - 29.5 %   MCV 82.2 80.0 - 100.0 fL   MCH 26.4 26.0 - 34.0 pg   MCHC 32.2 30.0 - 36.0 g/dL   RDW 28.4 13.2 - 44.0 %   Platelets 162 150 - 400 K/uL   nRBC 0.0 0.0 - 0.2 %    Comment: Performed at [facility], [address], [place], [place] [address]  Lactic acid, plasma     Status: Abnormal   Collection Time: 07/05/22  1:21 AM  Result Value Ref Range   Lactic Acid, Venous 3.6 (HH) 0.5 - 1.9 mmol/L    Comment: CRITICAL RESULT CALLED TO, READ BACK BY AND VERIFIED WITH [place] Sink, RN. 5366 07/05/22. LPAIT Performed at [facility], [address], [place], [place] [address]   Lactic acid, plasma     Status: Abnormal   Collection Time: 07/05/22  5:50 AM  Result Value Ref Range   Lactic Acid, Venous 4.3 (HH) 0.5 - 1.9 mmol/L     Comment: CRITICAL VALUE NOTED. VALUE IS CONSISTENT WITH PREVIOUSLY REPORTED/CALLED VALUE Performed at [facility], [address] [place], [place] [address]   CBG monitoring, ED     Status: Abnormal   Collection Time: 07/05/22  7:54 AM  Result Value Ref Range   Glucose-Capillary 281 (H) 70 - 99 mg/dL    Comment: Glucose  reference range applies only to samples taken after fasting for at least 8 hours.   Comment 1 Notify RN    Comment 2 Document in Chart    CT ABDOMEN PELVIS WO CONTRAST  Result Date: 07/04/2022 CLINICAL DATA:  Sepsis abdominal pain EXAM: CT ABDOMEN AND PELVIS WITHOUT CONTRAST TECHNIQUE: Multidetector CT imaging of the abdomen and pelvis was performed following the standard protocol without IV contrast. RADIATION DOSE REDUCTION: This exam was performed according to the departmental dose-optimization program which includes automated exposure control, adjustment of the mA and/or kV according to patient size and/or use of iterative reconstruction technique. COMPARISON:  CT 07/10/2021 FINDINGS: Lower chest: Lung bases demonstrate no acute airspace disease. Atelectasis or scarring at the bases. Coronary vascular calcification. Hepatobiliary: No focal liver abnormality is seen. No gallstones, gallbladder wall thickening, or biliary dilatation. Pancreas: Unremarkable. No pancreatic ductal dilatation or surrounding inflammatory changes. Spleen: Normal in size without focal abnormality. Adrenals/Urinary Tract: Status post right adrenalectomy. Left adrenal gland within normal limits. Kidneys show no hydronephrosis. The bladder is unremarkable Stomach/Bowel: The stomach is nonenlarged. No dilated small bowel. No acute bowel wall thickening. Negative appendix. Vascular/Lymphatic: Advanced aortic atherosclerosis. No aneurysm. No suspicious lymph nodes. Reproductive: Hysterectomy. Small gas and hyperdense material in the vagina. No adnexal mass Other: Negative for pelvic effusion or free air.  Anterior abdominal wall laxity. Ventral scar. Heterogeneous soft tissue density in the subcutaneous soft tissues measuring 11 mm to the right of midline, series 3, image 74 and along the low anterior pelvis, this measures 3.6 by 2.1 cm. Musculoskeletal: No acute osseous abnormality. IMPRESSION: 1. No CT evidence for acute intra-abdominal or pelvic abnormality. 2. Status post right adrenalectomy. 3. Heterogeneous soft tissue density in the subcutaneous soft tissues of the anterior abdominal wall and along the low anterior pelvis, grossly stable and potentially due to prominent soft tissue scarring 4. Aortic atherosclerosis. Aortic Atherosclerosis (ICD10-I70.0). Electronically Signed   By: Jasmine Pang M.D.   On: 07/04/2022 21:22   DG Chest Port 1 View  Result Date: 07/04/2022 CLINICAL DATA:  Possible sepsis EXAM: PORTABLE CHEST 1 VIEW COMPARISON:  05/22/2022 FINDINGS: Post sternotomy changes. No acute airspace disease or effusion. Stable cardiomediastinal silhouette. No pneumothorax IMPRESSION: No active disease. Electronically Signed   By: Jasmine Pang M.D.   On: 07/04/2022 20:05    Pending Labs Unresulted Labs (From admission, onward)     Start     Ordered   07/11/22 0500  Creatinine, serum  (enoxaparin (LOVENOX)    CrCl >/= 30 ml/min)  Weekly,   R     Comments: while on enoxaparin therapy    07/04/22 2325   07/05/22 0500  Basic metabolic panel  Daily,   R      07/04/22 2325   07/05/22 0500  CBC  Daily,   R      07/04/22 2325   07/04/22 1902  CBC with Differential  (Septic presentation on arrival (screening labs, nursing and treatment orders for obvious sepsis))  ONCE - STAT,   STAT        07/04/22 1903   07/04/22 1902  Blood Culture (routine x 2)  (Septic presentation on arrival (screening labs, nursing and treatment orders for obvious sepsis))  BLOOD CULTURE X 2,   STAT      07/04/22 1903            Vitals/Pain Today's Vitals   07/05/22 0758 07/05/22 0800 07/05/22 0903 07/05/22  0915  BP:  (!) 110/91 (!) 136/96  Pulse:  (!) 117 (!) 123 (!) 112  Resp:  13 15 20   Temp: 97.6 F (36.4 C)     TempSrc: Oral     SpO2:  100% 100% 100%  Weight:      Height:      PainSc:        Isolation Precautions No active isolations  Medications Medications  atorvastatin (LIPITOR) tablet 80 mg (80 mg Oral Given 07/05/22 0850)  escitalopram (LEXAPRO) tablet 20 mg (20 mg Oral Given 07/05/22 0851)  LORazepam (ATIVAN) tablet 0.5 mg (0.5 mg Oral Given 07/05/22 0253)  methimazole (TAPAZOLE) tablet 5 mg (5 mg Oral Given 07/05/22 0849)  pantoprazole (PROTONIX) EC tablet 40 mg (40 mg Oral Given 07/05/22 0847)  tamsulosin (FLOMAX) capsule 0.4 mg (0.4 mg Oral Given 07/05/22 0100)  clopidogrel (PLAVIX) tablet 75 mg (75 mg Oral Given 07/05/22 0850)  baclofen (LIORESAL) tablet 10 mg (10 mg Oral Given 07/05/22 0847)  cyclobenzaprine (FLEXERIL) tablet 5 mg (5 mg Oral Given 07/05/22 0148)  gabapentin (NEURONTIN) capsule 100 mg (100 mg Oral Given 07/05/22 0847)  topiramate (TOPAMAX) tablet 25 mg (25 mg Oral Given 07/05/22 0850)  albuterol (PROVENTIL) (2.5 MG/3ML) 0.083% nebulizer solution 2.5 mg (has no administration in time range)  insulin aspart (novoLOG) injection 0-5 Units ( Subcutaneous Not Given 07/05/22 0110)  insulin glargine-yfgn (SEMGLEE) injection 20 Units (20 Units Subcutaneous Given 07/05/22 0059)  insulin aspart (novoLOG) injection 3 Units (3 Units Subcutaneous Given 07/05/22 0759)  insulin aspart (novoLOG) injection 0-9 Units (5 Units Subcutaneous Given 07/05/22 0759)  enoxaparin (LOVENOX) injection 40 mg (40 mg Subcutaneous Patient Refused/Not Given 07/05/22 0851)  sodium chloride flush (NS) 0.9 % injection 3 mL ( Intravenous Canceled Entry 07/05/22 1000)  acetaminophen (TYLENOL) tablet 650 mg (650 mg Oral Given 07/05/22 0148)    Or  acetaminophen (TYLENOL) suppository 650 mg ( Rectal See Alternative 07/05/22 0148)  oxyCODONE (Oxy IR/ROXICODONE) immediate release tablet 5 mg (5 mg Oral Given  07/05/22 0636)  polyethylene glycol (MIRALAX / GLYCOLAX) packet 17 g (has no administration in time range)  prochlorperazine (COMPAZINE) injection 5 mg (has no administration in time range)  cefTRIAXone (ROCEPHIN) 2 g in sodium chloride 0.9 % 100 mL IVPB (0 g Intravenous Stopped 07/05/22 0136)  EPINEPHrine (ADRENALIN) 1 MG/ML (  Not Given 07/04/22 2339)  guaiFENesin (ROBITUSSIN) 100 MG/5ML liquid 5 mL (has no administration in time range)  lactated ringers bolus 1,000 mL (0 mLs Intravenous Stopped 07/04/22 2317)    And  lactated ringers bolus 1,000 mL (0 mLs Intravenous Stopped 07/04/22 2317)    And  lactated ringers bolus 1,000 mL (0 mLs Intravenous Stopped 07/05/22 0434)  aztreonam (AZACTAM) 2 g in sodium chloride 0.9 % 100 mL IVPB (0 g Intravenous Stopped 07/04/22 2023)  metroNIDAZOLE (FLAGYL) IVPB 500 mg (0 mg Intravenous Stopped 07/04/22 2215)  vancomycin (VANCOREADY) IVPB 1750 mg/350 mL (0 mg Intravenous Stopped 07/04/22 2328)  HYDROmorphone (DILAUDID) injection 0.5 mg (0.5 mg Intravenous Given 07/04/22 2212)  diphenhydrAMINE (BENADRYL) injection 25 mg (25 mg Intravenous Given 07/04/22 2326)  HYDROmorphone (DILAUDID) injection 0.5 mg (0.5 mg Intravenous Given 07/05/22 0358)  EPINEPHrine (EPI-PEN) injection 0.3 mg (0.3 mg Intramuscular Given 07/04/22 2338)  methylPREDNISolone sodium succinate (SOLU-MEDROL) 125 mg/2 mL injection 125 mg (125 mg Intravenous Given 07/04/22 2337)  famotidine (PEPCID) IVPB 20 mg premix (0 mg Intravenous Stopped 07/05/22 0039)    Mobility non-ambulatory     Focused Assessments Neuro Assessment Handoff:  Swallow screen pass? Yes  Cardiac Rhythm: Normal sinus  rhythm       Neuro Assessment: Exceptions to [facility] Neuro Checks:      Has TPA been given? No If patient is a Neuro Trauma and patient is going to OR before floor call report to 4N Charge nurse: [phone] or [phone]   R Recommendations: See Admitting Provider Note  Report given to:   Additional  Notes: Pt is bed bound, left sided deficits . Pt will repeat questions to staff, but will tell family on phone answers later on appropriately

## 2022-07-06 DIAGNOSIS — N3000 Acute cystitis without hematuria: Secondary | ICD-10-CM | POA: Diagnosis not present

## 2022-07-06 DIAGNOSIS — F419 Anxiety disorder, unspecified: Secondary | ICD-10-CM | POA: Diagnosis not present

## 2022-07-06 DIAGNOSIS — I10 Essential (primary) hypertension: Secondary | ICD-10-CM | POA: Diagnosis not present

## 2022-07-06 DIAGNOSIS — E1169 Type 2 diabetes mellitus with other specified complication: Secondary | ICD-10-CM | POA: Diagnosis not present

## 2022-07-06 LAB — CBC
HCT: 33 % — ABNORMAL LOW (ref 36.0–46.0)
Hemoglobin: 10.6 g/dL — ABNORMAL LOW (ref 12.0–15.0)
MCH: 25.9 pg — ABNORMAL LOW (ref 26.0–34.0)
MCHC: 32.1 g/dL (ref 30.0–36.0)
MCV: 80.7 fL (ref 80.0–100.0)
Platelets: 309 10*3/uL (ref 150–400)
RBC: 4.09 MIL/uL (ref 3.87–5.11)
RDW: 14.9 % (ref 11.5–15.5)
WBC: 14.4 10*3/uL — ABNORMAL HIGH (ref 4.0–10.5)
nRBC: 0 % (ref 0.0–0.2)

## 2022-07-06 LAB — GLUCOSE, CAPILLARY
Glucose-Capillary: 227 mg/dL — ABNORMAL HIGH (ref 70–99)
Glucose-Capillary: 207 mg/dL — ABNORMAL HIGH (ref 70–99)
Glucose-Capillary: 150 mg/dL — ABNORMAL HIGH (ref 70–99)
Glucose-Capillary: 107 mg/dL — ABNORMAL HIGH (ref 70–99)
Glucose-Capillary: 129 mg/dL — ABNORMAL HIGH (ref 70–99)

## 2022-07-06 LAB — BASIC METABOLIC PANEL
Anion gap: 9 (ref 5–15)
BUN: 18 mg/dL (ref 6–20)
CO2: 20 mmol/L — ABNORMAL LOW (ref 22–32)
Calcium: 8.2 mg/dL — ABNORMAL LOW (ref 8.9–10.3)
Chloride: 105 mmol/L (ref 98–111)
Creatinine, Ser: 0.94 mg/dL (ref 0.44–1.00)
GFR, Estimated: >60 mL/min (ref >60)
Glucose, Bld: 290 mg/dL — ABNORMAL HIGH (ref 70–99)
Potassium: 4.3 mmol/L (ref 3.5–5.1)
Sodium: 134 mmol/L — ABNORMAL LOW (ref 135–145)

## 2022-07-06 LAB — URINE CULTURE

## 2022-07-06 MED ORDER — SODIUM CHLORIDE 0.9 % IV SOLN: INTRAVENOUS | Status: DC | PRN

## 2022-07-06 NOTE — Progress Notes (Signed)
Physical Therapy Evaluation Patient Details Name: Lori [name] MRN: [id number] DOB: 1970/04/16 Today's Date: 07/06/2022  History of Present Illness  52 y/o female admitted 5/30 for onset of lightheadedness, dysuria, abdominal discomfort with mild cough.  Transported to ED, now found to have UTI, hypotension, leukocytosis, and started on ABT.  PMHx:  DM, obesity, CVA with spastic hemiplegia, hyperthyroidism, HTN, CAD, anxiety and depression, HLD, GERD, aphasia, AKI, polypharmacy, AMS,  Clinical Impression  Pt was seen for mobility on side of bed and to get her to attempt standing unsuccessfully.  Her light headed symptoms began sitting, with stable supine BP and sitting was 133/99, sat 100% and pulse 114.  Pt continued to complain of light headed feelings in bed with sat and HR controlled, down to 95 and still light headed.  Follow along with her to increase her functional level, but will recommend >3 hours of therapy a day if accepted, and if not recommend <3 hours a day to get her back to homecare level.  Pt is reporting 5 hours a day home alone and being able to self transfer in BR.  Follow up with pt and family for progression of progress.       Recommendations for follow up therapy are one component of a multi-disciplinary discharge planning process, led by the attending physician.  Recommendations may be updated based on patient status, additional functional criteria and insurance authorization.  Follow Up Recommendations Can patient physically be transported by private vehicle: No     Assistance Recommended at Discharge Frequent or constant Supervision/Assistance  Patient can return home with the following  Two people to help with walking and/or transfers;A lot of help with bathing/dressing/bathroom;Assistance with cooking/housework;Assist for transportation;Help with stairs or ramp for entrance    Equipment Recommendations None recommended by PT  Recommendations for Other Services   Rehab consult    Functional Status Assessment Patient has had a recent decline in their functional status and demonstrates the ability to make significant improvements in function in a reasonable and predictable amount of time.     Precautions / Restrictions Precautions Precautions: Fall Precaution Comments: watch HR and sats Restrictions Weight Bearing Restrictions: No      Mobility  Bed Mobility Overal bed mobility: Needs Assistance Bed Mobility: Supine to Sit, Sit to Supine     Supine to sit: Mod assist Sit to supine: Mod assist   General bed mobility comments: mod assist to get up and back, but total assist to scoot up in bed, pt giving conflicting information about PLOF being nearly independent vs husband helping her    Transfers Overall transfer level: Needs assistance                 General transfer comment: pt declined to scoot or stand as she became immediately light headed, but BP was 133/99    Ambulation/Gait               General Gait Details: deferred  Stairs            Wheelchair Mobility    Modified Rankin (Stroke Patients Only)       Balance Overall balance assessment: Needs assistance Sitting-balance support: Feet supported Sitting balance-Leahy Scale: Fair         Standing balance comment: deferred over light headed feeling                             Pertinent Vitals/Pain Pain Assessment Pain  Assessment: Faces Faces Pain Scale: Hurts a little bit Pain Location: generalized soreness    Home Living Family/patient expects to be discharged to:: Private residence Living Arrangements: Spouse/significant other Available Help at Discharge: Family;Available PRN/intermittently;Personal care attendant Type of Home: Apartment Home Access: Stairs to enter   Entrance Stairs-Number of Steps: 5   Home Layout: One level Home Equipment: Rollator (4 wheels);Hand held shower head;Hospital bed;Shower  seat;BSC/3in1;Wheelchair - manual      Prior Function                       Hand Dominance        Extremity/Trunk Assessment                Communication      Cognition Arousal/Alertness: Awake/alert Behavior During Therapy: Anxious Overall Cognitive Status: No family/caregiver present to determine baseline cognitive functioning                                 General Comments: pt is having trouble with some of her history information and some conflicting information        General Comments General comments (skin integrity, edema, etc.): Pt had HR of 114 and sat 100% while dizzy, BP was 133/99    Exercises     Assessment/Plan    PT Assessment Patient needs continued PT services  PT Problem List Decreased strength;Decreased range of motion;Decreased activity tolerance;Decreased balance;Decreased mobility;Decreased coordination;Cardiopulmonary status limiting activity;Obesity       PT Treatment Interventions DME instruction;Gait training;Functional mobility training;Therapeutic activities;Therapeutic exercise;Balance training;Neuromuscular re-education;Patient/family education    PT Goals (Current goals can be found in the Care Plan section)  Acute Rehab PT Goals Patient Stated Goal: to get to rehab setting PT Goal Formulation: With patient Time For Goal Achievement: 07/20/22 Potential to Achieve Goals: Good    Frequency Min 3X/week     Co-evaluation               AM-PAC PT "6 Clicks" Mobility  Outcome Measure Help needed turning from your back to your side while in a flat bed without using bedrails?: A Lot Help needed moving from lying on your back to sitting on the side of a flat bed without using bedrails?: A Lot Help needed moving to and from a bed to a chair (including a wheelchair)?: Total Help needed standing up from a chair using your arms (e.g., wheelchair or bedside chair)?: Total Help needed to walk in hospital  room?: Total Help needed climbing 3-5 steps with a railing? : Total 6 Click Score: 8    End of Session   Activity Tolerance: Patient tolerated treatment well;Patient limited by fatigue Patient left: in bed;with call bell/phone within reach;with family/visitor present;with bed alarm set Nurse Communication: Mobility status PT Visit Diagnosis: Muscle weakness (generalized) (M62.81);Difficulty in walking, not elsewhere classified (R26.2);Hemiplegia and hemiparesis Hemiplegia - Right/Left: Left Hemiplegia - dominant/non-dominant: Non-dominant Hemiplegia - caused by: Unspecified    Time: 2956-2130 PT Time Calculation (min) (ACUTE ONLY): 25 min   Charges:   PT Evaluation $PT Eval Moderate Complexity: 1 Mod PT Treatments $Therapeutic Activity: 8-22 mins       Ivar Drape 07/06/2022, 1:35 PM  Samul Dada, PT PhD Acute Rehab Dept. Number: [facility] [id number] and [facility] [phone]

## 2022-07-06 NOTE — Progress Notes (Signed)
PROGRESS NOTE    Lori [name]  ZOX:[id number] DOB: [birth year] DOA: 07/04/2022 PCP: Caesar Bookman, NP    Brief Narrative:   Lori [name] is a 52 y.o. female with medical history significant for CAD status post CABG, CVA with spastic hemiplegia, hypertension, insulin-dependent diabetes mellitus, hypothyroidism, depression, anxiety, pheochromocytoma status post adrenalectomy presented to hospital with lightheadedness, dysuria, abdominal discomfort with mild cough.  EMS was called in and was noted to be hypotensive with blood pressure in the 80s.  In the ED, patient had systolic blood pressure of 95.  CT scan of the abdomen and pelvis was negative for acute findings.  Labs showed lactic acid elevated at 2.4 with no leukocytosis and creatinine at 1.0.  COVID influenza and RSV was negative.  Urinalysis showed nitrate positive with leukocytes 11-20.  Initial lactate was elevated at 2.4.  Blood and urine cultures were collected from the ED and patient received 2 L of Ringer lactate.  Patient was empirically treated with vancomycin, azactam, Flagyl and had some erythema while infusing vancomycin which was treated with IM epinephrine, Solu-Medrol, Pepcid and Benadryl.  Patient was then admitted to the hospital for further evaluation and treatment.   Assessment/Plan   UTI  Continue Rocephin IV.  Urine culture showed Klebsiella pneumonia.  Blood cultures negative so far.    Mild elevation in WBC at 14.4.  Could be steroid induced.  Clinically improving at this time.  Less dysuria.  Erythema/pruritus while on vancomycin. Possibility of vancomycin due to "red man" syndrome.  Patient was treated for possible allergy in the ED.  Continue Benadryl.   Insulin-dependent DM with hyperglycemia Latest hemoglobin A1c was 11.3% in April 2024 continue long-acting and short acting insulin.  Monitor blood glucose levels. Diabetic coordinator on board. Will follow recommendations.    Essential hypertension   SBP was 80s with EMS and 90s initially in ED.  Patient has received IV fluid boluses with improvement in blood pressure at 110/91 at this time.  Metoprolol was restarted   Hyperthyroidism  - Continue Tapazole    CAD  Continue Lipitor and Plavix.   Hx of CVA with spastic hemiplegia  - Continue Lipitor, Plavix, baclofen.  Continue PT evaluation   Anxiety, depression  - Continue Lexapro, Ativan  Obesity.  Body mass index is 34.39 kg/m.  Benefit from weight loss as outpatient.    DVT prophylaxis: enoxaparin (LOVENOX) injection 40 mg Start: 07/05/22 1000   Code Status:     Code Status: Full Code  Disposition: Home likely in 1 to 2 days.  Status is: Inpatient Remains inpatient appropriate because: IV antibiotic, UTI,   Family Communication:  Spoke with the patient's spouse on the phone and updated him about the clinical condition of the patient.  Consultants:  None  Procedures:  None  Antimicrobials:  Rocephin IV  Anti-infectives (From admission, onward)    Start     Dose/Rate Route Frequency Ordered Stop   07/05/22 2300  vancomycin (VANCOCIN) IVPB 1000 mg/200 mL premix  Status:  Discontinued        1,000 mg 200 mL/hr over 60 Minutes Intravenous Every 24 hours 07/04/22 2210 07/04/22 2325   07/05/22 0400  ceFEPIme (MAXIPIME) 2 g in sodium chloride 0.9 % 100 mL IVPB  Status:  Discontinued        2 g 200 mL/hr over 30 Minutes Intravenous Every 8 hours 07/04/22 2205 07/04/22 2325   07/05/22 0000  cefTRIAXone (ROCEPHIN) 2 g in sodium chloride 0.9 % 100 mL  IVPB        2 g 200 mL/hr over 30 Minutes Intravenous Daily at bedtime 07/04/22 2325 07/11/22 2159   07/04/22 1930  vancomycin (VANCOREADY) IVPB 1750 mg/350 mL        1,750 mg 175 mL/hr over 120 Minutes Intravenous  Once 07/04/22 1915 07/04/22 2328   07/04/22 1915  aztreonam (AZACTAM) 2 g in sodium chloride 0.9 % 100 mL IVPB        2 g 200 mL/hr over 30 Minutes Intravenous  Once 07/04/22 1903 07/04/22 2023   07/04/22  1915  metroNIDAZOLE (FLAGYL) IVPB 500 mg        500 mg 100 mL/hr over 60 Minutes Intravenous  Once 07/04/22 1903 07/04/22 2215   07/04/22 1915  vancomycin (VANCOCIN) IVPB 1000 mg/200 mL premix  Status:  Discontinued        1,000 mg 200 mL/hr over 60 Minutes Intravenous  Once 07/04/22 1903 07/04/22 1915       Subjective: Today, patient was seen and examined at bedside.  Patient denies any nausea, vomiting, fever, chills or rigor.  Denies dyspnea but has some cough.  Feels better.  Has less dysuria.    Objective: Vitals:   07/05/22 2008 07/05/22 2342 07/06/22 0307 07/06/22 0855  BP: 100/71 115/78 120/80 (!) 114/91  Pulse: (!) 105 99 90 (!) 109  Resp: 16 13 16 16   Temp: 98.3 F (36.8 C)  98.8 F (37.1 C) 98.3 F (36.8 C)  TempSrc: Oral Oral Oral Oral  SpO2: 99% 97% 99% 96%  Weight:      Height:        Intake/Output Summary (Last 24 hours) at 07/06/2022 1113 Last data filed at 07/06/2022 0832 Gross per 24 hour  Intake 1343.41 ml  Output 1100 ml  Net 243.41 ml   Filed Weights   07/04/22 1849  Weight: 85.3 kg    Physical Examination: Body mass index is 34.39 kg/m.   General: Alert awake and Communicative, mild expressive dysarthria, not in obvious distress, obese build. HENT:   No scleral pallor or icterus noted. Oral mucosa is moist.   Chest:  Clear breath sounds.  No crackles or wheezes.  CVS: S1 &S2 heard. No murmur.  Regular rate and rhythm. Abdomen: Soft, nontender, nondistended.  Bowel sounds are heard.   Extremities: No cyanosis, clubbing or edema.  Peripheral pulses are palpable. Psych: Alert, awake and oriented, mildly anxious. CNS: Dysarthria.  Left upper extremity spastic weakness.  Moves lower extremities. Skin: Warm and dry.  No rashes noted.  Data Reviewed:   CBC: Recent Labs  Lab 07/04/22 2122 07/05/22 0121 07/06/22 0114  WBC 8.6 11.1* 14.4*  NEUTROABS 5.1  --   --   HGB 11.8* 11.9* 10.6*  HCT 36.1 37.0 33.0*  MCV 80.9 82.2 80.7  PLT 361 162  309     Basic Metabolic Panel: Recent Labs  Lab 07/04/22 1954 07/05/22 0121 07/06/22 0114  NA 137 137 134*  K 4.0 3.5 4.3  CL 98 106 105  CO2 24 18* 20*  GLUCOSE 197* 197* 290*  BUN 22* 19 18  CREATININE 1.06* 0.97 0.94  CALCIUM 9.4 8.3* 8.2*     Liver Function Tests: Recent Labs  Lab 07/04/22 1954  AST 15  ALT 17  ALKPHOS 117  BILITOT 0.6  PROT 8.1  ALBUMIN 3.7      Radiology Studies: CT ABDOMEN PELVIS WO CONTRAST  Result Date: 07/04/2022 CLINICAL DATA:  Sepsis abdominal pain EXAM: CT ABDOMEN AND PELVIS  WITHOUT CONTRAST TECHNIQUE: Multidetector CT imaging of the abdomen and pelvis was performed following the standard protocol without IV contrast. RADIATION DOSE REDUCTION: This exam was performed according to the departmental dose-optimization program which includes automated exposure control, adjustment of the mA and/or kV according to patient size and/or use of iterative reconstruction technique. COMPARISON:  CT 07/10/2021 FINDINGS: Lower chest: Lung bases demonstrate no acute airspace disease. Atelectasis or scarring at the bases. Coronary vascular calcification. Hepatobiliary: No focal liver abnormality is seen. No gallstones, gallbladder wall thickening, or biliary dilatation. Pancreas: Unremarkable. No pancreatic ductal dilatation or surrounding inflammatory changes. Spleen: Normal in size without focal abnormality. Adrenals/Urinary Tract: Status post right adrenalectomy. Left adrenal gland within normal limits. Kidneys show no hydronephrosis. The bladder is unremarkable Stomach/Bowel: The stomach is nonenlarged. No dilated small bowel. No acute bowel wall thickening. Negative appendix. Vascular/Lymphatic: Advanced aortic atherosclerosis. No aneurysm. No suspicious lymph nodes. Reproductive: Hysterectomy. Small gas and hyperdense material in the vagina. No adnexal mass Other: Negative for pelvic effusion or free air. Anterior abdominal wall laxity. Ventral scar.  Heterogeneous soft tissue density in the subcutaneous soft tissues measuring 11 mm to the right of midline, series 3, image 74 and along the low anterior pelvis, this measures 3.6 by 2.1 cm. Musculoskeletal: No acute osseous abnormality. IMPRESSION: 1. No CT evidence for acute intra-abdominal or pelvic abnormality. 2. Status post right adrenalectomy. 3. Heterogeneous soft tissue density in the subcutaneous soft tissues of the anterior abdominal wall and along the low anterior pelvis, grossly stable and potentially due to prominent soft tissue scarring 4. Aortic atherosclerosis. Aortic Atherosclerosis (ICD10-I70.0). Electronically Signed   By: Jasmine Pang M.D.   On: 07/04/2022 21:22   DG Chest Port 1 View  Result Date: 07/04/2022 CLINICAL DATA:  Possible sepsis EXAM: PORTABLE CHEST 1 VIEW COMPARISON:  05/22/2022 FINDINGS: Post sternotomy changes. No acute airspace disease or effusion. Stable cardiomediastinal silhouette. No pneumothorax IMPRESSION: No active disease. Electronically Signed   By: Jasmine Pang M.D.   On: 07/04/2022 20:05      LOS: 2 days    Joycelyn Das, MD [facility] Available via Epic secure chat 7am-7pm After these hours, please refer to coverage provider listed on [email or web address] 07/06/2022, 11:13 AM

## 2022-07-06 NOTE — Progress Notes (Signed)
Pt refused lovenox sq after RN educated pt about the benefit of the med. MD notified.   Lawson Radar, RN

## 2022-07-07 DIAGNOSIS — N3000 Acute cystitis without hematuria: Secondary | ICD-10-CM | POA: Diagnosis not present

## 2022-07-07 DIAGNOSIS — I10 Essential (primary) hypertension: Secondary | ICD-10-CM | POA: Diagnosis not present

## 2022-07-07 DIAGNOSIS — F419 Anxiety disorder, unspecified: Secondary | ICD-10-CM | POA: Diagnosis not present

## 2022-07-07 DIAGNOSIS — E1169 Type 2 diabetes mellitus with other specified complication: Secondary | ICD-10-CM | POA: Diagnosis not present

## 2022-07-07 LAB — BASIC METABOLIC PANEL
Anion gap: 6 (ref 5–15)
BUN: 20 mg/dL (ref 6–20)
CO2: 24 mmol/L (ref 22–32)
Calcium: 8.1 mg/dL — ABNORMAL LOW (ref 8.9–10.3)
Chloride: 108 mmol/L (ref 98–111)
Creatinine, Ser: 0.9 mg/dL (ref 0.44–1.00)
GFR, Estimated: >60 mL/min (ref >60)
Glucose, Bld: 135 mg/dL — ABNORMAL HIGH (ref 70–99)
Potassium: 4.2 mmol/L (ref 3.5–5.1)
Sodium: 138 mmol/L (ref 135–145)

## 2022-07-07 LAB — CBC
HCT: 33.4 % — ABNORMAL LOW (ref 36.0–46.0)
Hemoglobin: 10.8 g/dL — ABNORMAL LOW (ref 12.0–15.0)
MCH: 26.3 pg (ref 26.0–34.0)
MCHC: 32.3 g/dL (ref 30.0–36.0)
MCV: 81.3 fL (ref 80.0–100.0)
Platelets: 333 10*3/uL (ref 150–400)
RBC: 4.11 MIL/uL (ref 3.87–5.11)
RDW: 15.1 % (ref 11.5–15.5)
WBC: 7.9 10*3/uL (ref 4.0–10.5)
nRBC: 0 % (ref 0.0–0.2)

## 2022-07-07 LAB — GLUCOSE, CAPILLARY
Glucose-Capillary: 98 mg/dL (ref 70–99)
Glucose-Capillary: 124 mg/dL — ABNORMAL HIGH (ref 70–99)
Glucose-Capillary: 79 mg/dL (ref 70–99)
Glucose-Capillary: 84 mg/dL (ref 70–99)

## 2022-07-07 LAB — URINE CULTURE

## 2022-07-07 NOTE — Progress Notes (Signed)
PROGRESS NOTE    Lori [name]  ZOX:[id number] DOB: 1970-06-24 DOA: 07/04/2022 PCP: Caesar Bookman, NP    Brief Narrative:   Lori [name] is a 51 y.o. female with medical history significant for CAD status post CABG, CVA with spastic hemiplegia, hypertension, insulin-dependent diabetes mellitus, hypothyroidism, depression, anxiety, pheochromocytoma status post adrenalectomy presented to hospital with lightheadedness, dysuria, abdominal discomfort with mild cough.  EMS was called in and was noted to be hypotensive with blood pressure in the 80s.  In the ED, patient had systolic blood pressure of 95.  CT scan of the abdomen and pelvis was negative for acute findings.  Labs showed lactic acid elevated at 2.4 with no leukocytosis and creatinine at 1.0.  COVID influenza and RSV was negative.  Urinalysis showed nitrate positive with leukocytes 11-20.  Initial lactate was elevated at 2.4.  Blood and urine cultures were collected from the ED and patient received 2 L of Ringer lactate.  Patient was empirically treated with vancomycin, azactam, Flagyl and had some erythema while infusing vancomycin which was treated with IM epinephrine, Solu-Medrol, Pepcid and Benadryl.  Patient was then admitted to the hospital for further evaluation and treatment.   Assessment/Plan   UTI  Continue Rocephin IV.  Urine culture showed Klebsiella pneumonia sensitive to cephalosporins but resistant to Macrobid and ampicillin.  Blood cultures negative so far.    Mild elevation in WBC at 14.4 and has trended down to 7.9. Clinically improving at this time.    Erythema/pruritus while on vancomycin.  Continue Benadryl.  Not on vancomycin    Insulin-dependent DM with hyperglycemia Latest hemoglobin A1c was 11.3% in April 2024 continue long-acting and short acting insulin.  Monitor blood glucose levels. Diabetic coordinator on board.    Essential hypertension  SBP was 80s with EMS and 90s initially in ED.  Patient  has received IV fluid boluses with improvement in blood pressure at 110/91 at this time.  Metoprolol was restarted   Hyperthyroidism  - Continue Tapazole    CAD  Continue Lipitor and Plavix.   Hx of CVA with spastic hemiplegia  - Continue Lipitor, Plavix, baclofen.  Physical therapy evaluation has recommended CIR.   Anxiety, depression  - Continue Lexapro, Ativan  Obesity.  Body mass index is 34.39 kg/m.  Benefit from weight loss as outpatient.   Debility, deconditioning.  Physical therapy has seen the patient and recommended CIR.   DVT prophylaxis: enoxaparin (LOVENOX) injection 40 mg Start: 07/05/22 1000   Code Status:     Code Status: Full Code  Disposition: Physical therapy has recommended CIR at this time.  Status is: Inpatient Remains inpatient appropriate because: IV antibiotic, UTI,   Family Communication:  Spoke with the patient's spouse on the phone and updated him about the clinical condition of the patient on 07/06/2022.  Consultants:  None  Procedures:  None  Antimicrobials:  Rocephin IV  Anti-infectives (From admission, onward)    Start     Dose/Rate Route Frequency Ordered Stop   07/05/22 2300  vancomycin (VANCOCIN) IVPB 1000 mg/200 mL premix  Status:  Discontinued        1,000 mg 200 mL/hr over 60 Minutes Intravenous Every 24 hours 07/04/22 2210 07/04/22 2325   07/05/22 0400  ceFEPIme (MAXIPIME) 2 g in sodium chloride 0.9 % 100 mL IVPB  Status:  Discontinued        2 g 200 mL/hr over 30 Minutes Intravenous Every 8 hours 07/04/22 2205 07/04/22 2325   07/05/22 0000  cefTRIAXone (ROCEPHIN) 2 g in sodium chloride 0.9 % 100 mL IVPB        2 g 200 mL/hr over 30 Minutes Intravenous Daily at bedtime 07/04/22 2325 07/11/22 2159   07/04/22 1930  vancomycin (VANCOREADY) IVPB 1750 mg/350 mL        1,750 mg 175 mL/hr over 120 Minutes Intravenous  Once 07/04/22 1915 07/04/22 2328   07/04/22 1915  aztreonam (AZACTAM) 2 g in sodium chloride 0.9 % 100 mL IVPB         2 g 200 mL/hr over 30 Minutes Intravenous  Once 07/04/22 1903 07/04/22 2023   07/04/22 1915  metroNIDAZOLE (FLAGYL) IVPB 500 mg        500 mg 100 mL/hr over 60 Minutes Intravenous  Once 07/04/22 1903 07/04/22 2215   07/04/22 1915  vancomycin (VANCOCIN) IVPB 1000 mg/200 mL premix  Status:  Discontinued        1,000 mg 200 mL/hr over 60 Minutes Intravenous  Once 07/04/22 1903 07/04/22 1915       Subjective: Today, patient was seen and examined at bedside.  Denies nausea vomiting fever chills had some cough and reported wheezing.    Objective: Vitals:   07/06/22 1946 07/06/22 2308 07/07/22 0458 07/07/22 0807  BP: 109/86 112/75 107/79 (!) 135/100  Pulse:   88 88  Resp: 10 12 14 15   Temp: (!) 97.5 F (36.4 C) 97.6 F (36.4 C) 97.7 F (36.5 C) (!) 97.4 F (36.3 C)  TempSrc: Oral Oral Oral Oral  SpO2:  98% 97% 97%  Weight:      Height:        Intake/Output Summary (Last 24 hours) at 07/07/2022 1146 Last data filed at 07/07/2022 0801 Gross per 24 hour  Intake 3333.05 ml  Output 600 ml  Net 2733.05 ml    Filed Weights   07/04/22 1849  Weight: 85.3 kg    Physical Examination: Body mass index is 34.39 kg/m.   General: Alert awake and Communicative, mild expressive dysarthria, not in obvious distress, obese build. HENT:   No scleral pallor or icterus noted. Oral mucosa is moist.   Chest: Coarse breath sounds noted.  Diminished breath sounds bilaterally. CVS: S1 &S2 heard. No murmur.  Regular rate and rhythm. Abdomen: Soft, nontender, nondistended.  Bowel sounds are heard.   Extremities: No cyanosis, clubbing or edema.  Peripheral pulses are palpable. Psych: Alert, awake and oriented, mildly anxious. CNS:  left upper extremity spastic weakness, moves lower extremities.  Dysarthric. Skin: Warm and dry.  No rashes noted.  Data Reviewed:   CBC: Recent Labs  Lab 07/04/22 2122 07/05/22 0121 07/06/22 0114 07/07/22 0136  WBC 8.6 11.1* 14.4* 7.9  NEUTROABS 5.1  --   --    --   HGB 11.8* 11.9* 10.6* 10.8*  HCT 36.1 37.0 33.0* 33.4*  MCV 80.9 82.2 80.7 81.3  PLT 361 162 309 333     Basic Metabolic Panel: Recent Labs  Lab 07/04/22 1954 07/05/22 0121 07/06/22 0114 07/07/22 0136  NA 137 137 134* 138  K 4.0 3.5 4.3 4.2  CL 98 106 105 108  CO2 24 18* 20* 24  GLUCOSE 197* 197* 290* 135*  BUN 22* 19 18 20   CREATININE 1.06* 0.97 0.94 0.90  CALCIUM 9.4 8.3* 8.2* 8.1*     Liver Function Tests: Recent Labs  Lab 07/04/22 1954  AST 15  ALT 17  ALKPHOS 117  BILITOT 0.6  PROT 8.1  ALBUMIN 3.7  Radiology Studies: No results found.    LOS: 3 days    Joycelyn Das, MD [facility] Available via Epic secure chat 7am-7pm After these hours, please refer to coverage provider listed on [email or web address] 07/07/2022, 11:46 AM

## 2022-07-08 ENCOUNTER — Encounter: Payer: 59 | Admitting: Physical Medicine and Rehabilitation

## 2022-07-08 DIAGNOSIS — E1169 Type 2 diabetes mellitus with other specified complication: Secondary | ICD-10-CM | POA: Diagnosis not present

## 2022-07-08 DIAGNOSIS — I10 Essential (primary) hypertension: Secondary | ICD-10-CM | POA: Diagnosis not present

## 2022-07-08 DIAGNOSIS — N3000 Acute cystitis without hematuria: Secondary | ICD-10-CM | POA: Diagnosis not present

## 2022-07-08 DIAGNOSIS — F419 Anxiety disorder, unspecified: Secondary | ICD-10-CM | POA: Diagnosis not present

## 2022-07-08 LAB — GLUCOSE, CAPILLARY
Glucose-Capillary: 74 mg/dL (ref 70–99)
Glucose-Capillary: 176 mg/dL — ABNORMAL HIGH (ref 70–99)
Glucose-Capillary: 161 mg/dL — ABNORMAL HIGH (ref 70–99)
Glucose-Capillary: 253 mg/dL — ABNORMAL HIGH (ref 70–99)

## 2022-07-08 LAB — CBC
HCT: 32 % — ABNORMAL LOW (ref 36.0–46.0)
Hemoglobin: 10.2 g/dL — ABNORMAL LOW (ref 12.0–15.0)
MCH: 25.8 pg — ABNORMAL LOW (ref 26.0–34.0)
MCHC: 31.9 g/dL (ref 30.0–36.0)
MCV: 81 fL (ref 80.0–100.0)
Platelets: 319 10*3/uL (ref 150–400)
RBC: 3.95 MIL/uL (ref 3.87–5.11)
RDW: 15.2 % (ref 11.5–15.5)
WBC: 7.7 10*3/uL (ref 4.0–10.5)
nRBC: 0 % (ref 0.0–0.2)

## 2022-07-08 LAB — CULTURE, BLOOD (ROUTINE X 2)
Culture: NO GROWTH
Culture: NO GROWTH
Special Requests: ADEQUATE
Special Requests: ADEQUATE

## 2022-07-08 LAB — BASIC METABOLIC PANEL
Anion gap: 9 (ref 5–15)
BUN: 18 mg/dL (ref 6–20)
CO2: 22 mmol/L (ref 22–32)
Calcium: 8.1 mg/dL — ABNORMAL LOW (ref 8.9–10.3)
Chloride: 110 mmol/L (ref 98–111)
Creatinine, Ser: 0.98 mg/dL (ref 0.44–1.00)
GFR, Estimated: >60 mL/min (ref >60)
Glucose, Bld: 86 mg/dL (ref 70–99)
Potassium: 4 mmol/L (ref 3.5–5.1)
Sodium: 141 mmol/L (ref 135–145)

## 2022-07-08 MED ORDER — INSULIN GLARGINE-YFGN 100 UNIT/ML ~~LOC~~ SOLN
35.0000 [IU] | Freq: Every day | SUBCUTANEOUS | Status: DC
  Administered 2022-07-08 – 2022-07-09 (×2): 35 [IU] via SUBCUTANEOUS
  Filled 2022-07-08 (×3): qty 0.35

## 2022-07-08 NOTE — Progress Notes (Signed)
Physical Therapy Treatment Patient Details Name: Lori [name] MRN: [id number] DOB: May 19, 1970 Today's Date: 07/08/2022   History of Present Illness 52 y/o female admitted 5/30 for onset of lightheadedness, dysuria, abdominal discomfort with mild cough.  Transported to ED, now found to have UTI, hypotension, leukocytosis, and started on ABT.  PMHx:  DM, obesity, CVA with spastic hemiplegia, hyperthyroidism, HTN, CAD, anxiety and depression, HLD, GERD, aphasia, AKI, polypharmacy, AMS,    PT Comments    The pt was agreeable to session, continues to report lightheadedness with initial transition to sitting EOB. However, the pt's BP remained elevated throughout, and the pt's sx dissipated after sitting EOB for > 10 min.  The pt was then able to complete sit-stand transfer and stand-pivot transfer to recliner with modA of 1. The pt is dependent on BUE support and limited due to fatigue, but able to complete with no progression of dizziness. The pt will continue to benefit from skilled PT acutely to improve functional strength and return to independence with transfers.    Recommendations for follow up therapy are one component of a multi-disciplinary discharge planning process, led by the attending physician.  Recommendations may be updated based on patient status, additional functional criteria and insurance authorization.  Follow Up Recommendations  Can patient physically be transported by private vehicle: No    Assistance Recommended at Discharge Frequent or constant Supervision/Assistance  Patient can return home with the following Two people to help with walking and/or transfers;A lot of help with bathing/dressing/bathroom;Assistance with cooking/housework;Assist for transportation;Help with stairs or ramp for entrance   Equipment Recommendations  None recommended by PT    Recommendations for Other Services       Precautions / Restrictions Precautions Precautions: Fall Precaution  Comments: watch HR and sats Restrictions Weight Bearing Restrictions: No     Mobility  Bed Mobility Overal bed mobility: Needs Assistance Bed Mobility: Rolling, Sidelying to Sit Rolling: Mod assist Sidelying to sit: Mod assist, HOB elevated       General bed mobility comments: modA to complete roll with use of bed rail and modA to elevate trunk to sitting. pt lightheaded upon initial transition, BP remained elevated    Transfers Overall transfer level: Needs assistance Equipment used: 1 person hand held assist Transfers: Sit to/from Stand, Bed to chair/wheelchair/BSC Sit to Stand: Mod assist   Step pivot transfers: Mod assist       General transfer comment: limited to sfront-facing stand and then pivot to recliner. Pt holding PT with BUE and no instances of buckling this session. pt reports fatigue but VSS    Ambulation/Gait               General Gait Details: limited due to pt fatigue     Balance Overall balance assessment: Needs assistance Sitting-balance support: Feet supported Sitting balance-Leahy Scale: Fair Sitting balance - Comments: initially dizzy, improved after sitting >10 min. leaning to R   Standing balance support: Bilateral upper extremity supported Standing balance-Leahy Scale: Poor Standing balance comment: dependent on BUE support from therapist                            Cognition Arousal/Alertness: Awake/alert Behavior During Therapy: WFL for tasks assessed/performed Overall Cognitive Status: No family/caregiver present to determine baseline cognitive functioning  General Comments: pt with flat affect but does respond with increased processing time. able to follow simple cues consistently, difficulty with describing changes in vision now vs after her stroke.        Exercises      General Comments General comments (skin integrity, edema, etc.): BP elevated throughout: 172/104  (123) at rest prior to session, 182/131 (146) sitting EOB (small cuff, RN present and agreeable to continue) 159/111 (126) with appropriately sized BP cuff, 171/109 (128) sitting in recliner      Pertinent Vitals/Pain Pain Assessment Pain Assessment: No/denies pain Faces Pain Scale: No hurt Pain Intervention(s): Monitored during session     PT Goals (current goals can now be found in the care plan section) Acute Rehab PT Goals Patient Stated Goal: to get to rehab setting PT Goal Formulation: With patient Time For Goal Achievement: 07/20/22 Potential to Achieve Goals: Good Progress towards PT goals: Progressing toward goals    Frequency    Min 3X/week      PT Plan Discharge plan needs to be updated       AM-PAC PT "6 Clicks" Mobility   Outcome Measure  Help needed turning from your back to your side while in a flat bed without using bedrails?: A Lot Help needed moving from lying on your back to sitting on the side of a flat bed without using bedrails?: A Lot Help needed moving to and from a bed to a chair (including a wheelchair)?: A Lot Help needed standing up from a chair using your arms (e.g., wheelchair or bedside chair)?: A Lot Help needed to walk in hospital room?: Total Help needed climbing 3-5 steps with a railing? : Total 6 Click Score: 10    End of Session Equipment Utilized During Treatment: Gait belt Activity Tolerance: Patient tolerated treatment well;Patient limited by fatigue Patient left: in chair;with call bell/phone within reach Nurse Communication: Mobility status PT Visit Diagnosis: Muscle weakness (generalized) (M62.81);Difficulty in walking, not elsewhere classified (R26.2);Hemiplegia and hemiparesis Hemiplegia - Right/Left: Left Hemiplegia - dominant/non-dominant: Non-dominant Hemiplegia - caused by: Unspecified     Time: 1610-9604 PT Time Calculation (min) (ACUTE ONLY): 36 min  Charges:  $Gait Training: 8-22 mins $Therapeutic Activity:  8-22 mins                     Vickki Muff, PT, DPT   Acute Rehabilitation Department Office [phone] Secure Chat Communication Preferred   Lori [name] 07/08/2022, 12:47 PM

## 2022-07-08 NOTE — Progress Notes (Signed)
PROGRESS NOTE    Lori [name]  ZOX:[id number] DOB: [birth year] DOA: 07/04/2022 PCP: Caesar Bookman, NP    Brief Narrative:   Lori [name] is a 52 y.o. female with medical history significant for CAD status post CABG, CVA with spastic hemiplegia, hypertension, insulin-dependent diabetes mellitus, hypothyroidism, depression, anxiety, pheochromocytoma status post adrenalectomy presented to hospital with lightheadedness, dysuria, abdominal discomfort with mild cough.  EMS was called in and was noted to be hypotensive with blood pressure in the 80s.  In the ED, patient had systolic blood pressure of 95.  CT scan of the abdomen and pelvis was negative for acute findings.  Labs showed lactic acid elevated at 2.4 with no leukocytosis and creatinine at 1.0.  COVID influenza and RSV was negative.  Urinalysis showed nitrate positive with leukocytes 11-20.  Initial lactate was elevated at 2.4.  Blood and urine cultures were collected from the ED and patient received 2 L of Ringer lactate.  Patient was empirically treated with vancomycin, azactam, Flagyl and had some erythema while infusing vancomycin which was treated with IM epinephrine, Solu-Medrol, Pepcid and Benadryl.  Patient was then admitted to the hospital for further evaluation and treatment.   Assessment/Plan   UTI  Has completed  Rocephin IV 5-day course.Marland Kitchen  Urine culture showed Klebsiella pneumonia sensitive to cephalosporins but resistant to Macrobid and ampicillin.  Blood cultures negative so far.    Mild elevation in WBC at 14.4 on presentation which has trended down to 7.7 this time.. Clinically improving at this time.    Erythema/pruritus while on vancomycin.  Resolved.  As needed Benadryl.   Insulin-dependent DM with hyperglycemia Latest hemoglobin A1c was 11.3% in April 2024. continue long-acting and sliding scale insulin.  Holding prandial insulin since she is not eating much.  Decreased dose of long-acting insulin today.   Diabetic coordinator on board.    Essential hypertension  SBP was 80s with EMS and 90s initially in ED.  Patient has received IV fluid boluses with improvement in blood pressure at 110/91 at this time.  Metoprolol was restarted and as needed hydralazine.   Hyperthyroidism  - Continue Tapazole    CAD  Continue Lipitor and Plavix.   Hx of CVA with spastic hemiplegia  - Continue Lipitor, Plavix, baclofen.  Physical therapy evaluation has recommended CIR.   Anxiety, depression  - Continue Lexapro, Ativan  Obesity.  Body mass index is 34.39 kg/m.  Benefit from weight loss as outpatient.   Debility, deconditioning.  Physical therapy has seen the patient.  At this time plan is to skilled nursing facility.   DVT prophylaxis: enoxaparin (LOVENOX) injection 40 mg Start: 07/05/22 1000   Code Status:     Code Status: Full Code  Disposition: PT recommending skilled nursing facility placement.  Communicated with TOC.  Status is: Inpatient  Remains inpatient appropriate because: IV antibiotic, UTI, awaiting for skilled nursing facility placement.   Family Communication:  Spoke with the patient's spouse on the phone and updated him about the clinical condition of the patient on 07/06/2022.  Consultants:  None  Procedures:  None  Antimicrobials:  Rocephin IV-completed course.  Anti-infectives (From admission, onward)    Start     Dose/Rate Route Frequency Ordered Stop   07/05/22 2300  vancomycin (VANCOCIN) IVPB 1000 mg/200 mL premix  Status:  Discontinued        1,000 mg 200 mL/hr over 60 Minutes Intravenous Every 24 hours 07/04/22 2210 07/04/22 2325   07/05/22 0400  ceFEPIme (MAXIPIME) 2  g in sodium chloride 0.9 % 100 mL IVPB  Status:  Discontinued        2 g 200 mL/hr over 30 Minutes Intravenous Every 8 hours 07/04/22 2205 07/04/22 2325   07/05/22 0000  cefTRIAXone (ROCEPHIN) 2 g in sodium chloride 0.9 % 100 mL IVPB  Status:  Discontinued        2 g 200 mL/hr over 30 Minutes  Intravenous Daily at bedtime 07/04/22 2325 07/08/22 1317   07/04/22 1930  vancomycin (VANCOREADY) IVPB 1750 mg/350 mL        1,750 mg 175 mL/hr over 120 Minutes Intravenous  Once 07/04/22 1915 07/04/22 2328   07/04/22 1915  aztreonam (AZACTAM) 2 g in sodium chloride 0.9 % 100 mL IVPB        2 g 200 mL/hr over 30 Minutes Intravenous  Once 07/04/22 1903 07/04/22 2023   07/04/22 1915  metroNIDAZOLE (FLAGYL) IVPB 500 mg        500 mg 100 mL/hr over 60 Minutes Intravenous  Once 07/04/22 1903 07/04/22 2215   07/04/22 1915  vancomycin (VANCOCIN) IVPB 1000 mg/200 mL premix  Status:  Discontinued        1,000 mg 200 mL/hr over 60 Minutes Intravenous  Once 07/04/22 1903 07/04/22 1915       Subjective: Today, patient was seen and examined at bedside.  Denies any nausea vomiting fever chills or rigor.  Has mild cough.    Objective: Vitals:   07/07/22 1214 07/07/22 1556 07/07/22 1950 07/08/22 0749  BP: 117/86 (!) 123/95 (!) 129/93 (!) 164/103  Pulse: 86 84 79 75  Resp: 15 12 15 10   Temp: 97.8 F (36.6 C) 98.8 F (37.1 C) 98.5 F (36.9 C) 97.7 F (36.5 C)  TempSrc: Oral Oral Oral Oral  SpO2: 97% 97% 99% 98%  Weight:      Height:        Intake/Output Summary (Last 24 hours) at 07/08/2022 1425 Last data filed at 07/08/2022 1333 Gross per 24 hour  Intake 743.2 ml  Output 2950 ml  Net -2206.8 ml    Filed Weights   07/04/22 1849  Weight: 85.3 kg    Physical Examination: Body mass index is 34.39 kg/m.   General: Obese, alert awake and Communicative, expressive dysarthria,  HENT:   No scleral pallor or icterus noted. Oral mucosa is moist.   Chest: Coarse breath sounds noted.  Diminished breath sounds bilaterally. CVS: S1 &S2 heard. No murmur.  Regular rate and rhythm. Abdomen: Soft, nontender, nondistended.  Bowel sounds are heard.   Extremities: No cyanosis, clubbing or edema.  Peripheral pulses are palpable. Psych: Alert, awake and oriented, mildly anxious. CNS: Left upper  extremity spastic weakness, moves lower extremities, dysarthria, Skin: Warm and dry.  No rashes noted.  Data Reviewed:   CBC: Recent Labs  Lab 07/04/22 2122 07/05/22 0121 07/06/22 0114 07/07/22 0136 07/08/22 0214  WBC 8.6 11.1* 14.4* 7.9 7.7  NEUTROABS 5.1  --   --   --   --   HGB 11.8* 11.9* 10.6* 10.8* 10.2*  HCT 36.1 37.0 33.0* 33.4* 32.0*  MCV 80.9 82.2 80.7 81.3 81.0  PLT 361 162 309 333 319     Basic Metabolic Panel: Recent Labs  Lab 07/04/22 1954 07/05/22 0121 07/06/22 0114 07/07/22 0136 07/08/22 0214  NA 137 137 134* 138 141  K 4.0 3.5 4.3 4.2 4.0  CL 98 106 105 108 110  CO2 24 18* 20* 24 22  GLUCOSE 197* 197* 290* 135*  86  BUN 22* 19 18 20 18   CREATININE 1.06* 0.97 0.94 0.90 0.98  CALCIUM 9.4 8.3* 8.2* 8.1* 8.1*     Liver Function Tests: Recent Labs  Lab 07/04/22 1954  AST 15  ALT 17  ALKPHOS 117  BILITOT 0.6  PROT 8.1  ALBUMIN 3.7      Radiology Studies: No results found.    LOS: 4 days    Joycelyn Das, MD [facility] Available via Epic secure chat 7am-7pm After these hours, please refer to coverage provider listed on [email or web address] 07/08/2022, 2:25 PM

## 2022-07-08 NOTE — TOC Initial Note (Addendum)
Transition of Care [facility]) - Initial/Assessment Note    Patient Details  Name: Lori [name] MRN: [id number] Date of Birth: [birth year]  [facility] CM/SW Contact:    Eduard Roux, LCSW Phone Number: 07/08/2022, 3:00 PM  Clinical Narrative:                  CSW and CM met with patient at bedside. CSW introduced self and explained role. Patient was informed, she was screened for CIR but did not meet the medical necessity to support inpatient rehab. Patient states she was agreeable to short term rehab at [facility]. She states she has been to [place] before and was agreeable to return if available. Also interested in [place]. CSW explained the SNF process and possible limited SNF options. Patient states understanding. All questions answered.   TOC will provide bed offers once availble TOC will continue to follow and assist with discharge planning.  Antony Blackbird, MSW, LCSW Clinical Social Worker    Expected Discharge Plan: Skilled Nursing Facility Barriers to Discharge: Insurance Authorization, SNF Pending bed offer   Patient Goals and CMS Choice            Expected Discharge Plan and Services In-house Referral: Clinical Social Work     Living arrangements for the past 2 months: Single Family Home Expected Discharge Date: 07/08/22                                    Prior Living Arrangements/Services Living arrangements for the past 2 months: Single Family Home Lives with:: Self Patient language and need for interpreter reviewed:: No        Need for Family Participation in Patient Care: Yes (Comment) Care giver support system in place?: Yes (comment)   Criminal Activity/Legal Involvement Pertinent to Current Situation/Hospitalization: No - Comment as needed  Activities of Daily Living Home Assistive Devices/Equipment: Wheelchair, CBG Meter ADL Screening (condition at time of admission) Patient's cognitive ability adequate to safely  complete daily activities?: Yes Is the patient deaf or have difficulty hearing?: No Does the patient have difficulty seeing, even when wearing glasses/contacts?: No Does the patient have difficulty concentrating, remembering, or making decisions?: No Patient able to express need for assistance with ADLs?: Yes Does the patient have difficulty dressing or bathing?: Yes Independently performs ADLs?: No Communication: Independent Dressing (OT): Dependent Is this a change from baseline?: Pre-admission baseline Grooming: Dependent Is this a change from baseline?: Pre-admission baseline Feeding: Needs assistance Is this a change from baseline?: Pre-admission baseline Bathing: Dependent Is this a change from baseline?: Pre-admission baseline Toileting: Dependent Is this a change from baseline?: Pre-admission baseline In/Out Bed: Dependent Is this a change from baseline?: Pre-admission baseline Walks in Home: Dependent Is this a change from baseline?: Pre-admission baseline Does the patient have difficulty walking or climbing stairs?: Yes Weakness of Legs: Both (left leg - 1 / right leg - 4) Weakness of Arms/Hands: Both (left arm - contracted / right arm - 4)  Permission Sought/Granted Permission sought to share information with : Family Supports Permission granted to share information with : Yes, Verbal Permission Granted  Share Information with NAME: illiassou Ahanadou  Permission granted to share info w AGENCY: SNFs  Permission granted to share info w Relationship: spouse  Permission granted to share info w Contact Information: [phone]  Emotional Assessment Appearance:: Appears stated age Attitude/Demeanor/Rapport: Engaged Affect (typically observed): Appropriate, Accepting, Pleasant Orientation: :  Oriented to Self, Oriented to Place, Oriented to  Time, Oriented to Situation Alcohol / Substance Use: Not Applicable Psych Involvement: No (comment)  Admission diagnosis:  Acute  cystitis without hematuria [N30.00] Sepsis secondary to UTI (HCC) [A41.9, N39.0] Abdominal pain, unspecified abdominal location [R10.9] Hypotension, unspecified hypotension type [I95.9] Urinary tract infection without hematuria, site unspecified [N39.0] Sepsis, due to unspecified organism, unspecified whether acute organ dysfunction present [facility]) [A41.9] Patient Active Problem List   Diagnosis Date Noted   UTI (urinary tract infection) 07/04/2022   Mild intermittent asthma, uncomplicated 07/04/2022   Lethargy 05/01/2022   Hyponatremia 04/30/2022   Hyperkalemia 04/30/2022   AKI (acute kidney injury) (HCC) 04/30/2022   Wheelchair dependence 04/08/2022   Spasticity as late effect of cerebrovascular accident (CVA) 04/08/2022   Left hemiplegia (HCC) 04/08/2022   Dyspnea 11/02/2021   GERD (gastroesophageal reflux disease) 11/02/2021   Acute bronchitis 08/24/2021   Pseudohyponatremia 07/10/2021   Thyroid disease 07/10/2021   Functional urinary incontinence 05/17/2021   MDD (major depressive disorder), recurrent episode, severe (HCC) 04/09/2021   Diabetic gastroparesis (HCC) 03/16/2021   Migraines 03/16/2021   History of cerebral infarction    Debility 02/26/2021   DKA (diabetic ketoacidosis) (HCC) 02/19/2021   Weakness    Type 2 diabetes mellitus with hyperlipidemia (HCC)    Aphasia    Spell of abnormal behavior    History of CVA (cerebrovascular accident) 01/19/2021   Tachycardia 01/19/2021   CAD (coronary artery disease) 01/19/2021   Spastic hemiparesis of left 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 (HCC) 01/19/2021   Diabetes (HCC)    Hyperthyroidism    Essential hypertension    Pheochromocytoma    Anxiety and depression    HLD (hyperlipidemia)    PCP:  Caesar Bookman, NP Pharmacy:   [facility] - [place] - [address] [place] [address] Phone: [phone] Fax: [phone]     Social Determinants of Health  (SDOH) Social History: SDOH Screenings   Food Insecurity: No Food Insecurity (07/06/2022)  Housing: Low Risk  (07/06/2022)  Transportation Needs: No Transportation Needs (07/06/2022)  Utilities: Not At Risk (07/06/2022)  Depression (PHQ2-9): Low Risk  (05/29/2022)  Tobacco Use: Low Risk  (07/04/2022)   SDOH Interventions:     Readmission Risk Interventions    08/29/2021    2:32 PM 07/18/2021   12:43 PM 07/11/2021    1:22 PM  Readmission Risk Prevention Plan  Transportation Screening Complete Complete Complete  Medication Review Oceanographer) Complete Complete Complete  PCP or Specialist appointment within 3-5 days of discharge Complete Complete   HRI or Home Care Consult Complete Complete Complete  SW Recovery Care/Counseling Consult Complete Complete Complete  Palliative Care Screening Not Applicable Not Applicable Not Applicable  Skilled Nursing Facility Complete Complete Complete

## 2022-07-08 NOTE — Care Management Important Message (Signed)
Important Message  Patient Details  Name: Corky Tanenbaum MRN: [id number] Date of Birth: 1970/12/31   Medicare Important Message Given:  Yes     Renie Ora 07/08/2022, 8:39 AM

## 2022-07-08 NOTE — Progress Notes (Signed)
Inpatient Rehab Admissions Coordinator:   Consult received and chart reviewed.  Reviewed case with Dr. Wynn Banker.  Pt does not have the medical necessity to support an inpatient rehab admit at this time.  Will sign off, recommend TOC f/u for alternative rehab venues.   Estill Dooms, PT, DPT Admissions Coordinator [phone] 07/08/22  11:10 AM

## 2022-07-08 NOTE — Progress Notes (Signed)
Mobility Specialist Progress Note:    07/08/22 1200  Mobility  Activity Transferred from bed to chair  Level of Assistance Minimal assist, patient does 75% or more (+2)  Assistive Device Stedy  Activity Response Tolerated well  Mobility Referral Yes  $Mobility charge 1 Mobility  Mobility Specialist Start Time (ACUTE ONLY) 1150  Mobility Specialist Stop Time (ACUTE ONLY) 1202  Mobility Specialist Time Calculation (min) (ACUTE ONLY) 12 min   Nt requested assistance getting pt back in bed. Pt required +2 minA to transfer via stedy. No c/o throughout. Pt left in room for NT.   Thompson Grayer Mobility Specialist  Please contact vis Secure Chat or  Rehab Office [phone]

## 2022-07-08 NOTE — TOC CM/SW Note (Signed)
Per discussion w/ pt at bedside w/ CSW- pt voiced she has PCS worker that comes 7days/week- 3hrs/day-5pm-8pm. Spouse works but is nearby if needed in an emergency.  Pt states spouse transports her to appointments. Pt voiced she has DME in the home that includes hospital bed, BSC, RW, and wheelchair.  Address, phone # and PCP all confirmed.   CSW following for potential SNF placement- see CSW note.

## 2022-07-08 NOTE — [place] FL2 (Signed)
[place] MEDICAID FL2 LEVEL OF CARE FORM     IDENTIFICATION  Patient Name: Lori [name] Birthdate: 06-Apr-1970 Sex: female Admission Date (Current Location): 07/04/2022  [facility] and [place] Number:  Producer, television/film/video and Address:  [facility] [place], [address] [place], [place] [address]      Provider Number: [id number]  Attending Physician Name and Address:  Joycelyn Das, MD  Relative Name and Phone Number:       Current Level of Care: Hospital Recommended Level of Care: Skilled Nursing Facility Prior Approval Number:    Date Approved/Denied:   PASRR Number: [id number]  Discharge Plan: SNF    Current Diagnoses: Patient Active Problem List   Diagnosis Date Noted   UTI (urinary tract infection) 07/04/2022   Mild intermittent asthma, uncomplicated 07/04/2022   Lethargy 05/01/2022   Hyponatremia 04/30/2022   Hyperkalemia 04/30/2022   AKI (acute kidney injury) (HCC) 04/30/2022   Wheelchair dependence 04/08/2022   Spasticity as late effect of cerebrovascular accident (CVA) 04/08/2022   Left hemiplegia (HCC) 04/08/2022   Dyspnea 11/02/2021   GERD (gastroesophageal reflux disease) 11/02/2021   Acute bronchitis 08/24/2021   Pseudohyponatremia 07/10/2021   Thyroid disease 07/10/2021   Functional urinary incontinence 05/17/2021   MDD (major depressive disorder), recurrent episode, severe (HCC) 04/09/2021   Diabetic gastroparesis (HCC) 03/16/2021   Migraines 03/16/2021   History of cerebral infarction    Debility 02/26/2021   DKA (diabetic ketoacidosis) (HCC) 02/19/2021   Weakness    Type 2 diabetes mellitus with hyperlipidemia (HCC)    Aphasia    Spell of abnormal behavior    History of CVA (cerebrovascular accident) 01/19/2021   Tachycardia 01/19/2021   CAD (coronary artery disease) 01/19/2021   Spastic hemiparesis of left 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 (HCC) 01/19/2021   Diabetes (HCC)    Hyperthyroidism     Essential hypertension    Pheochromocytoma    Anxiety and depression    HLD (hyperlipidemia)     Orientation RESPIRATION BLADDER Height & Weight     Self, Time, Situation, Place  Normal Incontinent Weight: 188 lb (85.3 kg) Height:  5\' 2"  (157.5 cm)  BEHAVIORAL SYMPTOMS/MOOD NEUROLOGICAL BOWEL NUTRITION STATUS      Continent Diet (please see discharge summary)  AMBULATORY STATUS COMMUNICATION OF NEEDS Skin   Limited Assist Verbally Normal                       Personal Care Assistance Level of Assistance  Bathing, Feeding, Dressing Bathing Assistance: Limited assistance Feeding assistance: Independent Dressing Assistance: Limited assistance     Functional Limitations Info  Sight, Hearing, Speech Sight Info: Adequate Hearing Info: Adequate      SPECIAL CARE FACTORS FREQUENCY  PT (By licensed PT), OT (By licensed OT)     PT Frequency: 5x per week OT Frequency: 5x per week            Contractures Contractures Info: Not present    Additional Factors Info  Code Status, Allergies Code Status Info: FULL Allergies Info: Contrast media, penicillins, sumatriptan,sumatripan,vancomycin,aspirin,ciprofloxacin,morphine           Current Medications (07/08/2022):  This is the current hospital active medication list Current Facility-Administered Medications  Medication Dose Route Frequency Provider Last Rate Last Admin   0.9 %  sodium chloride infusion   Intravenous PRN Joycelyn Das, MD   Stopped at 07/07/22 0028   acetaminophen (TYLENOL) tablet 650 mg  650 mg Oral Q6H  PRN Pokhrel, Rebekah Chesterfield, MD       Or   acetaminophen (TYLENOL) suppository 650 mg  650 mg Rectal Q6H PRN Pokhrel, Laxman, MD       albuterol (PROVENTIL) (2.5 MG/3ML) 0.083% nebulizer solution 2.5 mg  2.5 mg Inhalation Q6H PRN Opyd, Lavone Neri, MD       atorvastatin (LIPITOR) tablet 80 mg  80 mg Oral Daily Opyd, Lavone Neri, MD   80 mg at 07/08/22 1059   baclofen (LIORESAL) tablet 10 mg  10 mg Oral TID Briscoe Deutscher, MD   10 mg at 07/08/22 1059   clopidogrel (PLAVIX) tablet 75 mg  75 mg Oral Daily Opyd, Lavone Neri, MD   75 mg at 07/08/22 1100   cyclobenzaprine (FLEXERIL) tablet 5 mg  5 mg Oral TID PRN Briscoe Deutscher, MD   5 mg at 07/06/22 1725   diphenhydrAMINE (BENADRYL) capsule 25 mg  25 mg Oral Q6H PRN Pokhrel, Laxman, MD   25 mg at 07/05/22 2241   enoxaparin (LOVENOX) injection 40 mg  40 mg Subcutaneous Daily Opyd, Lavone Neri, MD       escitalopram (LEXAPRO) tablet 20 mg  20 mg Oral q morning Opyd, Lavone Neri, MD   20 mg at 07/08/22 1059   gabapentin (NEURONTIN) capsule 100 mg  100 mg Oral TID Briscoe Deutscher, MD   100 mg at 07/08/22 1059   guaiFENesin (ROBITUSSIN) 100 MG/5ML liquid 5 mL  5 mL Oral Q4H PRN Opyd, Lavone Neri, MD       insulin aspart (novoLOG) injection 0-5 Units  0-5 Units Subcutaneous QHS Opyd, Lavone Neri, MD   3 Units at 07/05/22 2242   insulin aspart (novoLOG) injection 0-9 Units  0-9 Units Subcutaneous TID WC Opyd, Lavone Neri, MD   2 Units at 07/08/22 1428   insulin glargine-yfgn (SEMGLEE) injection 35 Units  35 Units Subcutaneous QHS Pokhrel, Laxman, MD       lactated ringers infusion   Intravenous Continuous Pokhrel, Laxman, MD 100 mL/hr at 07/07/22 1631 New Bag at 07/07/22 1631   LORazepam (ATIVAN) tablet 0.5 mg  0.5 mg Oral QHS Opyd, Lavone Neri, MD   0.5 mg at 07/07/22 2155   methimazole (TAPAZOLE) tablet 5 mg  5 mg Oral q morning Opyd, Lavone Neri, MD   5 mg at 07/08/22 1059   metoprolol tartrate (LOPRESSOR) tablet 50 mg  50 mg Oral BID Pokhrel, Laxman, MD   50 mg at 07/08/22 1059   oxyCODONE (Oxy IR/ROXICODONE) immediate release tablet 5 mg  5 mg Oral Q4H PRN Opyd, Lavone Neri, MD   5 mg at 07/08/22 1427   pantoprazole (PROTONIX) EC tablet 40 mg  40 mg Oral q morning Opyd, Lavone Neri, MD   40 mg at 07/08/22 1059   polyethylene glycol (MIRALAX / GLYCOLAX) packet 17 g  17 g Oral Daily PRN Opyd, Lavone Neri, MD       prochlorperazine (COMPAZINE) injection 5 mg  5 mg Intravenous Q4H PRN  Opyd, Lavone Neri, MD       sodium chloride flush (NS) 0.9 % injection 3 mL  3 mL Intravenous Q12H Opyd, Lavone Neri, MD   3 mL at 07/07/22 2156   tamsulosin (FLOMAX) capsule 0.4 mg  0.4 mg Oral QHS Opyd, Lavone Neri, MD   0.4 mg at 07/07/22 2155   topiramate (TOPAMAX) tablet 25 mg  25 mg Oral BID Briscoe Deutscher, MD   25 mg at 07/08/22 1059     Discharge  Medications: Please see discharge summary for a list of discharge medications.  Relevant Imaging Results:  Relevant Lab Results:   Additional Information SSN # [id number]  Eduard Roux, LCSW

## 2022-07-09 DIAGNOSIS — N3000 Acute cystitis without hematuria: Secondary | ICD-10-CM | POA: Diagnosis not present

## 2022-07-09 DIAGNOSIS — I10 Essential (primary) hypertension: Secondary | ICD-10-CM | POA: Diagnosis not present

## 2022-07-09 DIAGNOSIS — F419 Anxiety disorder, unspecified: Secondary | ICD-10-CM | POA: Diagnosis not present

## 2022-07-09 DIAGNOSIS — E1169 Type 2 diabetes mellitus with other specified complication: Secondary | ICD-10-CM | POA: Diagnosis not present

## 2022-07-09 LAB — GLUCOSE, CAPILLARY
Glucose-Capillary: 206 mg/dL — ABNORMAL HIGH (ref 70–99)
Glucose-Capillary: 119 mg/dL — ABNORMAL HIGH (ref 70–99)
Glucose-Capillary: 228 mg/dL — ABNORMAL HIGH (ref 70–99)
Glucose-Capillary: 190 mg/dL — ABNORMAL HIGH (ref 70–99)

## 2022-07-09 MED ORDER — RIVAROXABAN 10 MG PO TABS
10.0000 mg | ORAL_TABLET | Freq: Every day | ORAL | Status: DC
  Administered 2022-07-10: 10 mg via ORAL
  Filled 2022-07-09 (×2): qty 1

## 2022-07-09 NOTE — TOC Progression Note (Signed)
Transition of Care [facility]) - Progression Note    Patient Details  Name: Lori [name] MRN: [id number] Date of Birth: [birth year]  Transition of Care [facility]) CM/SW Contact  Eduard Roux, [place] Phone Number: 07/09/2022, 1:43 PM  Clinical Narrative:     Insurance auth for [employer])  pending reference # [id number]  Expected Discharge Plan: Skilled Nursing Facility Barriers to Discharge: Insurance Authorization, SNF Pending bed offer  Expected Discharge Plan and Services In-house Referral: Clinical Social Work     Living arrangements for the past 2 months: Single Family Home Expected Discharge Date: 07/08/22                                     Social Determinants of Health (SDOH) Interventions SDOH Screenings   Food Insecurity: No Food Insecurity (07/06/2022)  Housing: Low Risk  (07/06/2022)  Transportation Needs: No Transportation Needs (07/06/2022)  Utilities: Not At Risk (07/06/2022)  Depression (PHQ2-9): Low Risk  (05/29/2022)  Tobacco Use: Low Risk  (07/04/2022)    Readmission Risk Interventions    08/29/2021    2:32 PM 07/18/2021   12:43 PM 07/11/2021    1:22 PM  Readmission Risk Prevention Plan  Transportation Screening Complete Complete Complete  Medication Review Oceanographer) Complete Complete Complete  PCP or Specialist appointment within 3-5 days of discharge Complete Complete   HRI or Home Care Consult Complete Complete Complete  SW Recovery Care/Counseling Consult Complete Complete Complete  Palliative Care Screening Not Applicable Not Applicable Not Applicable  Skilled Nursing Facility Complete Complete Complete

## 2022-07-09 NOTE — Progress Notes (Addendum)
PROGRESS NOTE    Lori [name]  MVH:[id number] DOB: Jun 17, 1970 DOA: 07/04/2022 PCP: Caesar Bookman, NP    Brief Narrative:   Lori [name] is a 52 y.o. female with medical history significant for CAD status post CABG, CVA with spastic hemiplegia, hypertension, insulin-dependent diabetes mellitus, hypothyroidism, depression, anxiety, pheochromocytoma status post adrenalectomy presented to hospital with lightheadedness, dysuria, abdominal discomfort with mild cough.  EMS was called in and was noted to be hypotensive with blood pressure in the 80s.  In the ED, patient had systolic blood pressure of 95.  CT scan of the abdomen and pelvis was negative for acute findings.  Labs showed lactic acid elevated at 2.4 with no leukocytosis and creatinine at 1.0.  COVID influenza and RSV was negative.  Urinalysis showed nitrate positive with leukocytes 11-20.  Initial lactate was elevated at 2.4.  Blood and urine cultures were collected from the ED and patient received 2 L of Ringer lactate.  Patient was empirically treated with vancomycin, azactam, Flagyl and had some erythema while infusing vancomycin which was treated with IM epinephrine, Solu-Medrol, Pepcid and Benadryl.  Patient was then admitted to the hospital for further evaluation and treatment.   During hospitalization, patient completed 5-day course of Rocephin.  At this time patient has been stabilized.  Physical therapy has recommended skilled nursing facility placement.  Currently medically stable for disposition pending skilled nursing facility placement.  Assessment/Plan   UTI  Has completed  Rocephin IV 5-day course.Marland Kitchen  Urine culture showed Klebsiella pneumonia sensitive to cephalosporins but resistant to Macrobid and ampicillin.  Blood cultures negative so far.    Mild elevation in WBC at 14.4 on presentation which has trended down to 7.7 this time.  Has clinically improved  Erythema/pruritus while on vancomycin.  Resolved.   Initially needed some Benadryl.   Insulin-dependent DM with hyperglycemia Latest hemoglobin A1c was 11.3% in April 2024. continue long-acting and sliding scale insulin.  Holding prandial insulin since she was not eating much yesterday and decreased dose of long-acting insulin yesterday.  Blood glucose level today at 206.  Could readjust if necessary.  Diabetic coordinator on board.    Essential hypertension  SBP was 80s with EMS and 90s initially in ED.  Patient has received IV fluid boluses with improvement in blood pressure at 124/95 at this time.  Metoprolol was restarted and as needed hydralazine.   Hyperthyroidism  - Continue Tapazole    CAD  Continue Lipitor and Plavix.   Hx of CVA with spastic hemiplegia  - Continue Lipitor, Plavix, baclofen.  Physical therapy evaluation has skilled therapy needs.   Anxiety, depression  - Continue Lexapro, Ativan  Obesity.  Body mass index is 34.39 kg/m.  Benefit from weight loss as outpatient.   Debility, deconditioning.  Physical therapy on board, plan is to skilled nursing facility.    DVT prophylaxis: enoxaparin (LOVENOX) injection 40 mg Start: 07/05/22 1000 has been refusing so has been started on Xarelto 10 mg daily   Code Status:     Code Status: Full Code  Disposition: Awaiting skilled nursing facility placement.  Medically stable for disposition.  Status is: Inpatient  Remains inpatient appropriate because: awaiting for skilled nursing facility placement.   Family Communication:  None today.  Spoke with the patient's spouse on the phone and updated him about the clinical condition of the patient on 07/06/2022.  Consultants:  None  Procedures:  None  Antimicrobials:  Rocephin IV-completed course.  Anti-infectives (From admission, onward)  Start     Dose/Rate Route Frequency Ordered Stop   07/05/22 2300  vancomycin (VANCOCIN) IVPB 1000 mg/200 mL premix  Status:  Discontinued        1,000 mg 200 mL/hr over 60  Minutes Intravenous Every 24 hours 07/04/22 2210 07/04/22 2325   07/05/22 0400  ceFEPIme (MAXIPIME) 2 g in sodium chloride 0.9 % 100 mL IVPB  Status:  Discontinued        2 g 200 mL/hr over 30 Minutes Intravenous Every 8 hours 07/04/22 2205 07/04/22 2325   07/05/22 0000  cefTRIAXone (ROCEPHIN) 2 g in sodium chloride 0.9 % 100 mL IVPB  Status:  Discontinued        2 g 200 mL/hr over 30 Minutes Intravenous Daily at bedtime 07/04/22 2325 07/08/22 1317   07/04/22 1930  vancomycin (VANCOREADY) IVPB 1750 mg/350 mL        1,750 mg 175 mL/hr over 120 Minutes Intravenous  Once 07/04/22 1915 07/04/22 2328   07/04/22 1915  aztreonam (AZACTAM) 2 g in sodium chloride 0.9 % 100 mL IVPB        2 g 200 mL/hr over 30 Minutes Intravenous  Once 07/04/22 1903 07/04/22 2023   07/04/22 1915  metroNIDAZOLE (FLAGYL) IVPB 500 mg        500 mg 100 mL/hr over 60 Minutes Intravenous  Once 07/04/22 1903 07/04/22 2215   07/04/22 1915  vancomycin (VANCOCIN) IVPB 1000 mg/200 mL premix  Status:  Discontinued        1,000 mg 200 mL/hr over 60 Minutes Intravenous  Once 07/04/22 1903 07/04/22 1915      Subjective: Today, patient was seen and examined at bedside.  No interval complaints reported.  No fever chills nausea vomiting pain.   Objective: Vitals:   07/08/22 2006 07/08/22 2330 07/09/22 0320 07/09/22 0750  BP: (!) 133/94 117/81 109/82 (!) 124/95  Pulse: 92 91 92 80  Resp: 16 16 18 12   Temp: 97.7 F (36.5 C) 98 F (36.7 C) 97.9 F (36.6 C) 98.1 F (36.7 C)  TempSrc: Oral Oral Oral Oral  SpO2: 95% 97% 98%   Weight:      Height:        Intake/Output Summary (Last 24 hours) at 07/09/2022 1050 Last data filed at 07/08/2022 2330 Gross per 24 hour  Intake --  Output 1400 ml  Net -1400 ml    Filed Weights   07/04/22 1849  Weight: 85.3 kg    Physical Examination: Body mass index is 34.39 kg/m.   General: Alert awake and has expressive dysarthria, obese HENT:   No scleral pallor or icterus noted. Oral  mucosa is moist.   Chest: Diminished breath sounds bilaterally. CVS: S1 &S2 heard. No murmur.  Regular rate and rhythm. Abdomen: Soft, nontender, nondistended.  Bowel sounds are heard.   Extremities: No cyanosis, clubbing or edema.  Peripheral pulses are palpable. Psych: Alert, awake and Communicative, CNS: Left upper extremity spastic weakness, left lower extremity weakness, dysarthria,  skin: Warm and dry.  No rashes noted.  Data Reviewed:   CBC: Recent Labs  Lab 07/04/22 2122 07/05/22 0121 07/06/22 0114 07/07/22 0136 07/08/22 0214  WBC 8.6 11.1* 14.4* 7.9 7.7  NEUTROABS 5.1  --   --   --   --   HGB 11.8* 11.9* 10.6* 10.8* 10.2*  HCT 36.1 37.0 33.0* 33.4* 32.0*  MCV 80.9 82.2 80.7 81.3 81.0  PLT 361 162 309 333 319     Basic Metabolic Panel: Recent Labs  Lab 07/04/22 1954 07/05/22 0121 07/06/22 0114 07/07/22 0136 07/08/22 0214  NA 137 137 134* 138 141  K 4.0 3.5 4.3 4.2 4.0  CL 98 106 105 108 110  CO2 24 18* 20* 24 22  GLUCOSE 197* 197* 290* 135* 86  BUN 22* 19 18 20 18   CREATININE 1.06* 0.97 0.94 0.90 0.98  CALCIUM 9.4 8.3* 8.2* 8.1* 8.1*     Liver Function Tests: Recent Labs  Lab 07/04/22 1954  AST 15  ALT 17  ALKPHOS 117  BILITOT 0.6  PROT 8.1  ALBUMIN 3.7      Radiology Studies: No results found.    LOS: 5 days    Joycelyn Das, MD [facility] Available via Epic secure chat 7am-7pm After these hours, please refer to coverage provider listed on [email or web address] 07/09/2022, 10:50 AM

## 2022-07-09 NOTE — TOC Progression Note (Signed)
Transition of Care [facility]) - Progression Note    Patient Details  Name: Lori [name] MRN: [id number] Date of Birth: December 04, 1970  Transition of Care [facility]) CM/SW Contact  Eduard Roux, [place] Phone Number: 07/09/2022, 11:52 AM  Clinical Narrative:     CSW met with patient at bedside. CSW verbally informed the patient of her SNF rehab offer. CSW explained she only has once offer w/ [facility]. Pateitn accepted the bed offer.   Informed SNF, patient has accepted bed offer and TOC will start insurance auth today.  TOC will continue to follow and assist and discharge planning.  Antony Blackbird, MSW, LCSW Clinical Social Worker      Expected Discharge Plan: Skilled Nursing Facility Barriers to Discharge: Insurance Authorization, SNF Pending bed offer  Expected Discharge Plan and Services In-house Referral: Clinical Social Work     Living arrangements for the past 2 months: Single Family Home Expected Discharge Date: 07/08/22                                     Social Determinants of Health (SDOH) Interventions SDOH Screenings   Food Insecurity: No Food Insecurity (07/06/2022)  Housing: Low Risk  (07/06/2022)  Transportation Needs: No Transportation Needs (07/06/2022)  Utilities: Not At Risk (07/06/2022)  Depression (PHQ2-9): Low Risk  (05/29/2022)  Tobacco Use: Low Risk  (07/04/2022)    Readmission Risk Interventions    08/29/2021    2:32 PM 07/18/2021   12:43 PM 07/11/2021    1:22 PM  Readmission Risk Prevention Plan  Transportation Screening Complete Complete Complete  Medication Review Oceanographer) Complete Complete Complete  PCP or Specialist appointment within 3-5 days of discharge Complete Complete   HRI or Home Care Consult Complete Complete Complete  SW Recovery Care/Counseling Consult Complete Complete Complete  Palliative Care Screening Not Applicable Not Applicable Not Applicable  Skilled Nursing Facility Complete Complete Complete

## 2022-07-10 DIAGNOSIS — N3000 Acute cystitis without hematuria: Secondary | ICD-10-CM | POA: Diagnosis not present

## 2022-07-10 LAB — CBC
HCT: 35 % — ABNORMAL LOW (ref 36.0–46.0)
Hemoglobin: 11.3 g/dL — ABNORMAL LOW (ref 12.0–15.0)
MCH: 26.2 pg (ref 26.0–34.0)
MCHC: 32.3 g/dL (ref 30.0–36.0)
MCV: 81 fL (ref 80.0–100.0)
Platelets: 398 10*3/uL (ref 150–400)
RBC: 4.32 MIL/uL (ref 3.87–5.11)
RDW: 14.9 % (ref 11.5–15.5)
WBC: 8.6 10*3/uL (ref 4.0–10.5)
nRBC: 0 % (ref 0.0–0.2)

## 2022-07-10 LAB — BASIC METABOLIC PANEL
Anion gap: 12 (ref 5–15)
BUN: 22 mg/dL — ABNORMAL HIGH (ref 6–20)
CO2: 20 mmol/L — ABNORMAL LOW (ref 22–32)
Calcium: 8.5 mg/dL — ABNORMAL LOW (ref 8.9–10.3)
Chloride: 104 mmol/L (ref 98–111)
Creatinine, Ser: 1.13 mg/dL — ABNORMAL HIGH (ref 0.44–1.00)
GFR, Estimated: 60 mL/min — ABNORMAL LOW (ref >60)
Glucose, Bld: 183 mg/dL — ABNORMAL HIGH (ref 70–99)
Potassium: 4.3 mmol/L (ref 3.5–5.1)
Sodium: 136 mmol/L (ref 135–145)

## 2022-07-10 LAB — GLUCOSE, CAPILLARY: Glucose-Capillary: 149 mg/dL — ABNORMAL HIGH (ref 70–99)

## 2022-07-10 LAB — CULTURE, BLOOD (ROUTINE X 2)

## 2022-07-10 MED ORDER — HYDROCODONE-ACETAMINOPHEN 5-325 MG PO TABS: 1.0000 | ORAL_TABLET | Freq: Four times a day (QID) | ORAL | 0 refills | Status: DC | PRN

## 2022-07-10 MED ORDER — CYCLOBENZAPRINE HCL 5 MG PO TABS: 5.0000 mg | ORAL_TABLET | Freq: Three times a day (TID) | ORAL | 1 refills | Status: DC | PRN

## 2022-07-10 MED ORDER — BACLOFEN 10 MG PO TABS: 10.0000 mg | ORAL_TABLET | Freq: Three times a day (TID) | ORAL | 1 refills | Status: DC

## 2022-07-10 MED ORDER — GABAPENTIN 100 MG PO CAPS
100.0000 mg | ORAL_CAPSULE | Freq: Three times a day (TID) | ORAL | 0 refills | Status: DC
Start: 2022-07-10 — End: 2022-08-27

## 2022-07-10 MED ORDER — LORAZEPAM 0.5 MG PO TABS
0.5000 mg | ORAL_TABLET | Freq: Every day | ORAL | 0 refills | Status: DC
Start: 2022-07-10 — End: 2022-08-06

## 2022-07-10 MED ORDER — RIVAROXABAN 10 MG PO TABS: 10.0000 mg | ORAL_TABLET | Freq: Every day | ORAL | 0 refills | Status: DC

## 2022-07-10 MED ORDER — TOPIRAMATE 25 MG PO TABS
25.0000 mg | ORAL_TABLET | Freq: Two times a day (BID) | ORAL | 0 refills | Status: DC
Start: 2022-07-10 — End: 2023-06-13

## 2022-07-10 NOTE — Progress Notes (Addendum)
Placed d/c AVS in pt's d/c package. Called report to [employer], Charity fundraiser at  [employer].   Lawson Radar, RN

## 2022-07-10 NOTE — TOC Transition Note (Signed)
Transition of Care [facility]) - CM/SW Discharge Note   Patient Details  Name: Lori [name] MRN: [id number] Date of Birth: 1970-02-20  Transition of Care [facility]) CM/SW Contact:  Eduard Roux, LCSW Phone Number: 07/10/2022, 9:54 AM   Clinical Narrative:     Patient will Discharge to: [facility]  Discharge Date: 07/10/2022 Family Notified: spouse Transport By: Sharin Mons  Per MD patient is ready for discharge. RN, patient, and facility notified of discharge. Discharge Summary sent to facility. RN given number for [phone]. Ambulance transport requested for patient.   Clinical Social Worker signing off.  Antony Blackbird, MSW, LCSW Clinical Social Worker     Final next level of care: Skilled Nursing Facility Barriers to Discharge: Barriers Resolved   Patient Goals and CMS Choice      Discharge Placement                Patient chooses bed at:  ([facility]) Patient to be transferred to facility by: [facility] Name of family member notified: spouse Patient and family notified of of transfer: 07/10/22  Discharge Plan and Services Additional resources added to the After Visit Summary for   In-house Referral: Clinical Social Work                                   Social Determinants of Health (SDOH) Interventions SDOH Screenings   Food Insecurity: No Food Insecurity (07/06/2022)  Housing: Low Risk  (07/06/2022)  Transportation Needs: No Transportation Needs (07/06/2022)  Utilities: Not At Risk (07/06/2022)  Depression (PHQ2-9): Low Risk  (05/29/2022)  Tobacco Use: Low Risk  (07/04/2022)     Readmission Risk Interventions    08/29/2021    2:32 PM 07/18/2021   12:43 PM 07/11/2021    1:22 PM  Readmission Risk Prevention Plan  Transportation Screening Complete Complete Complete  Medication Review Oceanographer) Complete Complete Complete  PCP or Specialist appointment within 3-5 days of discharge Complete Complete   HRI or Home Care Consult  Complete Complete Complete  SW Recovery Care/Counseling Consult Complete Complete Complete  Palliative Care Screening Not Applicable Not Applicable Not Applicable  Skilled Nursing Facility Complete Complete Complete

## 2022-07-10 NOTE — Progress Notes (Signed)
Physical Therapy Treatment Patient Details Name: Lori [name] MRN: [id number] DOB: Aug 06, 1970 Today's Date: 07/10/2022   History of Present Illness 52 y/o female admitted 5/30 for onset of lightheadedness, dysuria, abdominal discomfort with mild cough. Transported to ED, now found to have UTI, hypotension, leukocytosis, and started on ABT. PMHx: DM, obesity, CVA with spastic hemiplegia, hyperthyroidism, HTN, CAD, anxiety and depression, HLD, GERD, aphasia, AKI, polypharmacy, AMS.    PT Comments    Pt received in supine, alert with slow processing and agreeable to therapy session and with good participation and tolerance for bed mobility and supine/seated exercises and seated balance activity. Pt with poor seated balance this date when unsupported and c/o fatigue after sitting EOB ~10 mins, also lacking +2 assist therefore PTA defer standing trials. Pt performed seated lateral scooting toward HOB with +1 maxA (nearly totalA) with bed pad assist. Pt assisted to set up breakfast tray per her request (pt unable to effectively do this on her own due to residual L hemi-body weakness) and bed placed in upright chair posture with HOB fully elevated to reduce risk of aspiration. Pt continues to benefit from PT services to progress toward functional mobility goals.   Recommendations for follow up therapy are one component of a multi-disciplinary discharge planning process, led by the attending physician.  Recommendations may be updated based on patient status, additional functional criteria and insurance authorization.  Follow Up Recommendations  Can patient physically be transported by private vehicle: No    Assistance Recommended at Discharge Frequent or constant Supervision/Assistance  Patient can return home with the following Two people to help with walking and/or transfers;A lot of help with bathing/dressing/bathroom;Assistance with cooking/housework;Assist for transportation;Help with stairs or  ramp for entrance   Equipment Recommendations  None recommended by PT    Recommendations for Other Services       Precautions / Restrictions Precautions Precautions: Fall Precaution Comments: watch HR and sats Restrictions Weight Bearing Restrictions: No     Mobility  Bed Mobility Overal bed mobility: Needs Assistance Bed Mobility: Rolling, Sidelying to Sit, Sit to Sidelying Rolling: Mod assist Sidelying to sit: Mod assist, HOB elevated     Sit to sidelying: Mod assist General bed mobility comments: modA to complete roll with use of bed rail and modA to elevate trunk to sitting. Increased time/effort to perform; BLE assist to return to sidelying then roll to supine.    Transfers Overall transfer level: Needs assistance   Transfers: Bed to chair/wheelchair/BSC            Lateral/Scoot Transfers: Max assist, Total assist, +2 physical assistance General transfer comment: Pt with poor seated balance and only +1 assist available, defer standing trial due to pt fatigue for her safety. Pt able to perform lateral seated scoots x2 reps toward her R side with RUE supported by rail. MaxA to nearly totalA each attempt with pt having difficulty using BLE support to assist; heavy bed pad use to assist her. Pt food then arriving to room so return to bed chair posture so pt can have breakfast prior to impending DC.    Ambulation/Gait               General Gait Details: defer, lack of +2 assist and pt eager to eat prior to DC   Stairs             Wheelchair Mobility    Modified Rankin (Stroke Patients Only)       Balance Overall balance assessment: Needs assistance  Sitting-balance support: Feet supported, Single extremity supported Sitting balance-Leahy Scale: Poor Sitting balance - Comments: R lean, pt improved with cues but needs reminders often; using RUE for support throughout; poor to zero without RUE support Postural control: Right lateral lean      Standing balance comment: defer, lack of +2 assist                            Cognition Arousal/Alertness: Awake/alert Behavior During Therapy: WFL for tasks assessed/performed Overall Cognitive Status: No family/caregiver present to determine baseline cognitive functioning                                 General Comments: Pt with flat affect but does respond with increased processing time. Able to follow simple cues consistently. Decreased insight into health condition and need for low salt diet, fluid monitoring, etc. When tray table arrived, food cut into small pieces by PTA, pt was encouraged to take small bites and sips of water between bites.        Exercises Other Exercises Other Exercises: supine BLE AROM: ankle pumps, quad sets, heel slides x10 reps ea Other Exercises: seated BLE AROM: hip flexion, LAQ x10 reps ea Other Exercises: seated midline posture with deep breaths x5 reps    General Comments General comments (skin integrity, edema, etc.): pt c/o back itching, barrier ointment applied for comfort; pt c/o LUE pain so used smaller cuff on R forearm for BP (below IV site which was in R elbow); BP 147/105 (118) LUE prior to sitting EOB; BP 149/104 (118) R forearm sitting EOB; HR 80's bpm, SpO2 97-100% on RA      Pertinent Vitals/Pain Pain Assessment Pain Assessment: Faces Faces Pain Scale: Hurts little more Pain Location: LUE (upper arm) with A/PROM and BP cuff Pain Descriptors / Indicators: Discomfort, Grimacing, Guarding, Sore, Tender Pain Intervention(s): Limited activity within patient's tolerance, Monitored during session, Repositioned, Premedicated before session           PT Goals (current goals can now be found in the care plan section) Acute Rehab PT Goals Patient Stated Goal: to get to rehab setting PT Goal Formulation: With patient Time For Goal Achievement: 07/20/22 Progress towards PT goals: Progressing toward goals     Frequency    Min 3X/week      PT Plan Current plan remains appropriate       AM-PAC PT "6 Clicks" Mobility   Outcome Measure  Help needed turning from your back to your side while in a flat bed without using bedrails?: A Lot Help needed moving from lying on your back to sitting on the side of a flat bed without using bedrails?: A Lot Help needed moving to and from a bed to a chair (including a wheelchair)?: Total Help needed standing up from a chair using your arms (e.g., wheelchair or bedside chair)?: Total Help needed to walk in hospital room?: Total Help needed climbing 3-5 steps with a railing? : Total 6 Click Score: 8    End of Session   Activity Tolerance: Patient tolerated treatment well;Patient limited by fatigue Patient left: with call bell/phone within reach;in bed;with bed alarm set;Other (comment) (heels floated, bed in chair posture, pt set up to eat breakfast) Nurse Communication: Mobility status PT Visit Diagnosis: Muscle weakness (generalized) (M62.81);Difficulty in walking, not elsewhere classified (R26.2);Hemiplegia and hemiparesis Hemiplegia - Right/Left: Left Hemiplegia - dominant/non-dominant:  Non-dominant Hemiplegia - caused by: Unspecified     Time: 2956-2130 PT Time Calculation (min) (ACUTE ONLY): 41 min  Charges:  $Therapeutic Exercise: 23-37 mins $Therapeutic Activity: 8-22 mins                     Issai Werling P., PTA Acute Rehabilitation Services Secure Chat Preferred 9a-5:30pm Office: [phone]    Dorathy Kinsman [facility] 07/10/2022, 11:43 AM

## 2022-07-10 NOTE — Discharge Summary (Signed)
Physician Discharge Summary  Lori [name] Becton ZOX:[id number] DOB: 01/04/1971 DOA: 07/04/2022  PCP: Caesar Bookman, NP  Admit date: 07/04/2022  Discharge date: 07/10/2022  Admitted From:Home  Disposition:  SNF  Recommendations for Outpatient Follow-up:  Follow up with PCP in 1-2 weeks Continue medications as prescribed below  Home Health: None  Equipment/Devices: None  Discharge Condition:Stable  CODE STATUS: Full  Diet recommendation: Heart Healthy/carb modified  Brief/Interim Summary: Lori [name] is a 52 y.o. female with medical history significant for CAD status post CABG, CVA with spastic hemiplegia, hypertension, insulin-dependent diabetes mellitus, hypothyroidism, depression, anxiety, pheochromocytoma status post adrenalectomy presented to hospital with lightheadedness, dysuria, abdominal discomfort with mild cough.  EMS was called in and was noted to be hypotensive with blood pressure in the 80s.  In the ED, patient had systolic blood pressure of 95.  She was noted to have UTI with Klebsiella pneumonia and has completed a 5-day course of IV Rocephin.  She was also noted to have some erythremia pruritus while on vancomycin and required some Benadryl initially to help with this reaction.  She has now completed her treatment and is in stable condition for discharge to SNF.  No other acute events or concerns noted throughout the course of this admission.  Discharge Diagnoses:  Principal Problem:   UTI (urinary tract infection) Active Problems:   Type 2 diabetes mellitus with hyperlipidemia (HCC)   Essential hypertension   History of CVA (cerebrovascular accident)   Hyperthyroidism   CAD (coronary artery disease)   Anxiety and depression   Mild intermittent asthma, uncomplicated  Principal discharge diagnosis: Klebsiella pneumonia UTI.  Reaction to vancomycin with erythremia pruritus.  Discharge Instructions  Discharge Instructions     Call MD for:  persistant  nausea and vomiting   Complete by: As directed    Call MD for:  severe uncontrolled pain   Complete by: As directed    Call MD for:  temperature >100.4   Complete by: As directed    Diet - low sodium heart healthy   Complete by: As directed    Diet - low sodium heart healthy   Complete by: As directed    Diet Carb Modified   Complete by: As directed    Discharge instructions   Complete by: As directed    Follow-up with your primary care provider in 1 week.  Check blood work at that time.  Seek medical attention for worsening symptoms.   Increase activity slowly   Complete by: As directed    Increase activity slowly   Complete by: As directed    Increase activity slowly   Complete by: As directed       Allergies as of 07/10/2022       Reactions   Contrast Media [iodinated Contrast Media] Anaphylaxis, Swelling   Penicillins Swelling   Mouth swells up and eyes swollen shut   Sumatriptan Anaphylaxis   Vancomycin Other (See Comments)   Red Man's Syndrome   Aspirin Swelling   Ciprofloxacin    Feel nausea/vomitting   Morphine Swelling        Medication List     TAKE these medications    albuterol 108 (90 Base) MCG/ACT inhaler Commonly known as: VENTOLIN HFA Inhale 2 puffs into the lungs every 6 (six) hours as needed for wheezing or shortness of breath.   albuterol (2.5 MG/3ML) 0.083% nebulizer solution Commonly known as: PROVENTIL Take 3 mLs (2.5 mg total) by nebulization every 6 (six) hours as needed for  wheezing or shortness of breath.   amLODipine 5 MG tablet Commonly known as: NORVASC Take 5 mg by mouth daily.   atorvastatin 80 MG tablet Commonly known as: LIPITOR Take 1 tablet (80 mg total) by mouth daily.   baclofen 10 MG tablet Commonly known as: LIORESAL Take 1 tablet (10 mg total) by mouth 3 (three) times daily. X 1 week, then 10 mg TID-for spasticity- can increase  the dose if you call me to discuss   blood glucose meter kit and supplies 1 each by  Other route as directed. Dispense based on patient and insurance preference. Use up to four times daily as directed. (FOR ICD-10 E10.9, E11.9).   budesonide 0.25 MG/2ML nebulizer solution Commonly known as: PULMICORT Take 2 mLs (0.25 mg total) by nebulization 2 (two) times daily.   clopidogrel 75 MG tablet Commonly known as: PLAVIX Take 1 tablet (75 mg total) by mouth daily.   cyclobenzaprine 5 MG tablet Commonly known as: FLEXERIL Take 1 tablet (5 mg total) by mouth 3 (three) times daily as needed for muscle spasms.   diclofenac Sodium 1 % Gel Commonly known as: VOLTAREN Apply 4 g topically 4 (four) times daily.   escitalopram 20 MG tablet Commonly known as: LEXAPRO Take 1 tablet (20 mg total) by mouth every morning.   ezetimibe 10 MG tablet Commonly known as: Zetia Take 1 tablet (10 mg total) by mouth daily.   gabapentin 100 MG capsule Commonly known as: NEURONTIN Take 1 capsule (100 mg total) by mouth 3 (three) times daily.   guaiFENesin 600 MG 12 hr tablet Commonly known as: Mucinex Take 1 tablet (600 mg total) by mouth 2 (two) times daily.   HYDROcodone-acetaminophen 5-325 MG tablet Commonly known as: NORCO/VICODIN Take 1 tablet by mouth every 6 (six) hours as needed for moderate pain.   insulin aspart 100 UNIT/ML FlexPen Commonly known as: NOVOLOG Inject 15 Units into the skin 3 (three) times daily with meals.   insulin glargine 100 UNIT/ML Solostar Pen Commonly known as: LANTUS Inject 40 Units into the skin at bedtime.   Insulin Pen Needle 32G X 4 MM Misc Use to inject insulin 4 times daily as directed.   LORazepam 0.5 MG tablet Commonly known as: ATIVAN Take 1 tablet (0.5 mg total) by mouth at bedtime.   methimazole 5 MG tablet Commonly known as: TAPAZOLE Take 1 tablet (5 mg total) by mouth every morning.   metoprolol tartrate 100 MG tablet Commonly known as: LOPRESSOR TAKE 1 TABLET BY MOUTH TWICE DAILY (BREAKFAST, BEDTIME)   Klayesta  powder Generic drug: nystatin Apply 1 Application topically 2 (two) times daily. Affected areas on breast fold, groin, and sacral areas.   nystatin cream Commonly known as: MYCOSTATIN Apply 1 Application topically 2 (two) times daily. Affected areas on breast fold and groin and sacral areas   olmesartan-hydrochlorothiazide 40-25 MG tablet Commonly known as: BENICAR HCT Take 1 tablet by mouth daily.   pantoprazole 40 MG tablet Commonly known as: PROTONIX TAKE 1 TABLET BY MOUTH EVERY MORNING   polyethylene glycol powder 17 GM/SCOOP powder Commonly known as: GLYCOLAX/MIRALAX Take 17 g by mouth daily. Hold for loose stool   rivaroxaban 10 MG Tabs tablet Commonly known as: XARELTO Take 1 tablet (10 mg total) by mouth daily.   tamsulosin 0.4 MG Caps capsule Commonly known as: FLOMAX TAKE 1 CAPSULE BY MOUTH AT BEDTIME   topiramate 25 MG tablet Commonly known as: TOPAMAX Take 1 tablet (25 mg total) by mouth 2 (two)  times daily. What changed: See the new instructions.   triamcinolone ointment 0.5 % Commonly known as: KENALOG Apply 1 Application topically 2 (two) times daily.   zinc oxide 11.3 % Crea cream Commonly known as: BALMEX Apply 1 Application topically 2 (two) times daily.        Follow-up Information     Ngetich, Dinah C, NP Follow up in 1 week(s).   Specialty: Family Medicine Contact information: [address] [place] [address] [phone]                Allergies  Allergen Reactions   Contrast Media [Iodinated Contrast Media] Anaphylaxis and Swelling   Penicillins Swelling    Mouth swells up and eyes swollen shut   Sumatriptan Anaphylaxis   Vancomycin Other (See Comments)    Red Man's Syndrome   Aspirin Swelling   Ciprofloxacin     Feel nausea/vomitting   Morphine Swelling    Consultations: None   Procedures/Studies: CT ABDOMEN PELVIS WO CONTRAST  Result Date: 07/04/2022 CLINICAL DATA:  Sepsis abdominal pain EXAM: CT ABDOMEN AND  PELVIS WITHOUT CONTRAST TECHNIQUE: Multidetector CT imaging of the abdomen and pelvis was performed following the standard protocol without IV contrast. RADIATION DOSE REDUCTION: This exam was performed according to the departmental dose-optimization program which includes automated exposure control, adjustment of the mA and/or kV according to patient size and/or use of iterative reconstruction technique. COMPARISON:  CT 07/10/2021 FINDINGS: Lower chest: Lung bases demonstrate no acute airspace disease. Atelectasis or scarring at the bases. Coronary vascular calcification. Hepatobiliary: No focal liver abnormality is seen. No gallstones, gallbladder wall thickening, or biliary dilatation. Pancreas: Unremarkable. No pancreatic ductal dilatation or surrounding inflammatory changes. Spleen: Normal in size without focal abnormality. Adrenals/Urinary Tract: Status post right adrenalectomy. Left adrenal gland within normal limits. Kidneys show no hydronephrosis. The bladder is unremarkable Stomach/Bowel: The stomach is nonenlarged. No dilated small bowel. No acute bowel wall thickening. Negative appendix. Vascular/Lymphatic: Advanced aortic atherosclerosis. No aneurysm. No suspicious lymph nodes. Reproductive: Hysterectomy. Small gas and hyperdense material in the vagina. No adnexal mass Other: Negative for pelvic effusion or free air. Anterior abdominal wall laxity. Ventral scar. Heterogeneous soft tissue density in the subcutaneous soft tissues measuring 11 mm to the right of midline, series 3, image 74 and along the low anterior pelvis, this measures 3.6 by 2.1 cm. Musculoskeletal: No acute osseous abnormality. IMPRESSION: 1. No CT evidence for acute intra-abdominal or pelvic abnormality. 2. Status post right adrenalectomy. 3. Heterogeneous soft tissue density in the subcutaneous soft tissues of the anterior abdominal wall and along the low anterior pelvis, grossly stable and potentially due to prominent soft tissue  scarring 4. Aortic atherosclerosis. Aortic Atherosclerosis (ICD10-I70.0). Electronically Signed   By: Jasmine Pang M.D.   On: 07/04/2022 21:22   DG Chest Port 1 View  Result Date: 07/04/2022 CLINICAL DATA:  Possible sepsis EXAM: PORTABLE CHEST 1 VIEW COMPARISON:  05/22/2022 FINDINGS: Post sternotomy changes. No acute airspace disease or effusion. Stable cardiomediastinal silhouette. No pneumothorax IMPRESSION: No active disease. Electronically Signed   By: Jasmine Pang M.D.   On: 07/04/2022 20:05     Discharge Exam: Vitals:   07/10/22 0234 07/10/22 0744  BP: 118/88 115/81  Pulse: 90   Resp: 12 10  Temp: 98 F (36.7 C) 98.2 F (36.8 C)  SpO2: 99%    Vitals:   07/09/22 2105 07/09/22 2332 07/10/22 0234 07/10/22 0744  BP:  109/78 118/88 115/81  Pulse:  96 90   Resp: 13  16 12 10   Temp:  98.4 F (36.9 C) 98 F (36.7 C) 98.2 F (36.8 C)  TempSrc:  Oral Oral Oral  SpO2:  99% 99%   Weight:      Height:        General: Pt is alert, awake, not in acute distress Cardiovascular: RRR, S1/S2 +, no rubs, no gallops Respiratory: CTA bilaterally, no wheezing, no rhonchi Abdominal: Soft, NT, ND, bowel sounds + Extremities: no edema, no cyanosis    The results of significant diagnostics from this hospitalization (including imaging, microbiology, ancillary and laboratory) are listed below for reference.     Microbiology: Recent Results (from the past 240 hour(s))  Blood Culture (routine x 2)     Status: None   Collection Time: 07/04/22  7:54 PM   Specimen: BLOOD  Result Value Ref Range Status   Specimen Description BLOOD SITE NOT SPECIFIED  Final   Special Requests   Final    BOTTLES DRAWN AEROBIC AND ANAEROBIC Blood Culture adequate volume   Culture   Final    NO GROWTH 5 DAYS Performed at [facility], [address] [place], [place] [address]    Report Status 07/09/2022 FINAL  Final  Resp panel by RT-PCR (RSV, Flu A&B, Covid) Anterior Nasal Swab     Status: None    Collection Time: 07/04/22  8:31 PM   Specimen: Anterior Nasal Swab  Result Value Ref Range Status   SARS Coronavirus 2 by RT PCR NEGATIVE NEGATIVE Final   Influenza A by PCR NEGATIVE NEGATIVE Final   Influenza B by PCR NEGATIVE NEGATIVE Final    Comment: (NOTE) The Xpert Xpress SARS-CoV-2/FLU/RSV plus assay is intended as an aid in the diagnosis of influenza from Nasopharyngeal swab specimens and should not be used as a sole basis for treatment. Nasal washings and aspirates are unacceptable for Xpert Xpress SARS-CoV-2/FLU/RSV testing.  Fact Sheet for Patients: BloggerCourse.com  Fact Sheet for Healthcare Providers: SeriousBroker.it  This test is not yet approved or cleared by the [place] FDA and has been authorized for detection and/or diagnosis of SARS-CoV-2 by FDA under an Emergency Use Authorization (EUA). This EUA will remain in effect (meaning this test can be used) for the duration of the COVID-19 declaration under Section 564(b)(1) of the Act, 21 U.S.C. section 360bbb-3(b)(1), unless the authorization is terminated or revoked.     Resp Syncytial Virus by PCR NEGATIVE NEGATIVE Final    Comment: (NOTE) Fact Sheet for Patients: BloggerCourse.com  Fact Sheet for Healthcare Providers: SeriousBroker.it  This test is not yet approved or cleared by the [place] FDA and has been authorized for detection and/or diagnosis of SARS-CoV-2 by FDA under an Emergency Use Authorization (EUA). This EUA will remain in effect (meaning this test can be used) for the duration of the COVID-19 declaration under Section 564(b)(1) of the Act, 21 U.S.C. section 360bbb-3(b)(1), unless the authorization is terminated or revoked.  Performed at [facility], [address] [place], [place] [address]   Urine Culture     Status: Abnormal   Collection Time: 07/04/22  8:31 PM    Specimen: Urine, Clean Catch  Result Value Ref Range Status   Specimen Description URINE, CLEAN CATCH  Final   Special Requests NONE Reflexed from [id number]  Final   Culture (A)  Final    >=100,000 COLONIES/mL KLEBSIELLA PNEUMONIAE 50,000 COLONIES/mL CORYNEBACTERIUM SPECIES Standardized susceptibility testing for this organism is not available. Performed at [facility], [address], [place], [place] [address]    Report Status 07/07/2022 FINAL  Final   Organism ID, Bacteria KLEBSIELLA PNEUMONIAE (A)  Final      Susceptibility   Klebsiella pneumoniae - MIC*    AMPICILLIN RESISTANT Resistant     CEFAZOLIN <=4 SENSITIVE Sensitive     CEFEPIME <=0.12 SENSITIVE Sensitive     CEFTRIAXONE <=0.25 SENSITIVE Sensitive     CIPROFLOXACIN <=0.25 SENSITIVE Sensitive     GENTAMICIN <=1 SENSITIVE Sensitive     IMIPENEM <=0.25 SENSITIVE Sensitive     NITROFURANTOIN 64 INTERMEDIATE Intermediate     TRIMETH/SULFA <=20 SENSITIVE Sensitive     AMPICILLIN/SULBACTAM <=2 SENSITIVE Sensitive     PIP/TAZO <=4 SENSITIVE Sensitive     * >=100,000 COLONIES/mL KLEBSIELLA PNEUMONIAE  Blood Culture (routine x 2)     Status: None (Preliminary result)   Collection Time: 07/05/22  1:19 AM   Specimen: BLOOD RIGHT HAND  Result Value Ref Range Status   Specimen Description BLOOD RIGHT HAND  Final   Special Requests   Final    BOTTLES DRAWN AEROBIC ONLY Blood Culture adequate volume   Culture   Final    NO GROWTH 4 DAYS Performed at [facility], [address], [place], [place] [address]    Report Status PENDING  Incomplete     Labs: BNP (last 3 results) Recent Labs    04/30/22 1245 05/22/22 0229  BNP 26.3 49.6   Basic Metabolic Panel: Recent Labs  Lab 07/05/22 0121 07/06/22 0114 07/07/22 0136 07/08/22 0214 07/10/22 0142  NA 137 134* 138 141 136  K 3.5 4.3 4.2 4.0 4.3  CL 106 105 108 110 104  CO2 18* 20* 24 22 20*  GLUCOSE 197* 290* 135* 86 183*  BUN 19 18 20 18  22*  CREATININE  0.97 0.94 0.90 0.98 1.13*  CALCIUM 8.3* 8.2* 8.1* 8.1* 8.5*   Liver Function Tests: Recent Labs  Lab 07/04/22 1954  AST 15  ALT 17  ALKPHOS 117  BILITOT 0.6  PROT 8.1  ALBUMIN 3.7   Recent Labs  Lab 07/04/22 1954  LIPASE 23   No results for input(s): "AMMONIA" in the last 168 hours. CBC: Recent Labs  Lab 07/04/22 2122 07/05/22 0121 07/06/22 0114 07/07/22 0136 07/08/22 0214 07/10/22 0142  WBC 8.6 11.1* 14.4* 7.9 7.7 8.6  NEUTROABS 5.1  --   --   --   --   --   HGB 11.8* 11.9* 10.6* 10.8* 10.2* 11.3*  HCT 36.1 37.0 33.0* 33.4* 32.0* 35.0*  MCV 80.9 82.2 80.7 81.3 81.0 81.0  PLT 361 162 309 333 319 398   Cardiac Enzymes: No results for input(s): "CKTOTAL", "CKMB", "CKMBINDEX", "TROPONINI" in the last 168 hours. BNP: Invalid input(s): "POCBNP" CBG: Recent Labs  Lab 07/09/22 0609 07/09/22 1117 07/09/22 1627 07/09/22 2113 07/10/22 0553  GLUCAP 206* 119* 228* 190* 149*   D-Dimer No results for input(s): "DDIMER" in the last 72 hours. Hgb A1c No results for input(s): "HGBA1C" in the last 72 hours. Lipid Profile No results for input(s): "CHOL", "HDL", "LDLCALC", "TRIG", "CHOLHDL", "LDLDIRECT" in the last 72 hours. Thyroid function studies No results for input(s): "TSH", "T4TOTAL", "T3FREE", "THYROIDAB" in the last 72 hours.  Invalid input(s): "FREET3" Anemia work up No results for input(s): "VITAMINB12", "FOLATE", "FERRITIN", "TIBC", "IRON", "RETICCTPCT" in the last 72 hours. Urinalysis    Component Value Date/Time   COLORURINE YELLOW 07/04/2022 2031   APPEARANCEUR CLEAR 07/04/2022 2031   LABSPEC 1.010 07/04/2022 2031  PHURINE 5.0 07/04/2022 2031   GLUCOSEU NEGATIVE 07/04/2022 2031   HGBUR NEGATIVE 07/04/2022 2031   BILIRUBINUR NEGATIVE 07/04/2022 2031   KETONESUR NEGATIVE 07/04/2022 2031   PROTEINUR NEGATIVE 07/04/2022 2031   NITRITE POSITIVE (A) 07/04/2022 2031   LEUKOCYTESUR MODERATE (A) 07/04/2022 2031   Sepsis Labs Recent Labs  Lab  07/06/22 0114 07/07/22 0136 07/08/22 0214 07/10/22 0142  WBC 14.4* 7.9 7.7 8.6   Microbiology Recent Results (from the past 240 hour(s))  Blood Culture (routine x 2)     Status: None   Collection Time: 07/04/22  7:54 PM   Specimen: BLOOD  Result Value Ref Range Status   Specimen Description BLOOD SITE NOT SPECIFIED  Final   Special Requests   Final    BOTTLES DRAWN AEROBIC AND ANAEROBIC Blood Culture adequate volume   Culture   Final    NO GROWTH 5 DAYS Performed at [facility], [address] [place], [place] [address]    Report Status 07/09/2022 FINAL  Final  Resp panel by RT-PCR (RSV, Flu A&B, Covid) Anterior Nasal Swab     Status: None   Collection Time: 07/04/22  8:31 PM   Specimen: Anterior Nasal Swab  Result Value Ref Range Status   SARS Coronavirus 2 by RT PCR NEGATIVE NEGATIVE Final   Influenza A by PCR NEGATIVE NEGATIVE Final   Influenza B by PCR NEGATIVE NEGATIVE Final    Comment: (NOTE) The Xpert Xpress SARS-CoV-2/FLU/RSV plus assay is intended as an aid in the diagnosis of influenza from Nasopharyngeal swab specimens and should not be used as a sole basis for treatment. Nasal washings and aspirates are unacceptable for Xpert Xpress SARS-CoV-2/FLU/RSV testing.  Fact Sheet for Patients: BloggerCourse.com  Fact Sheet for Healthcare Providers: SeriousBroker.it  This test is not yet approved or cleared by the [place] FDA and has been authorized for detection and/or diagnosis of SARS-CoV-2 by FDA under an Emergency Use Authorization (EUA). This EUA will remain in effect (meaning this test can be used) for the duration of the COVID-19 declaration under Section 564(b)(1) of the Act, 21 U.S.C. section 360bbb-3(b)(1), unless the authorization is terminated or revoked.     Resp Syncytial Virus by PCR NEGATIVE NEGATIVE Final    Comment: (NOTE) Fact Sheet for  Patients: BloggerCourse.com  Fact Sheet for Healthcare Providers: SeriousBroker.it  This test is not yet approved or cleared by the [place] FDA and has been authorized for detection and/or diagnosis of SARS-CoV-2 by FDA under an Emergency Use Authorization (EUA). This EUA will remain in effect (meaning this test can be used) for the duration of the COVID-19 declaration under Section 564(b)(1) of the Act, 21 U.S.C. section 360bbb-3(b)(1), unless the authorization is terminated or revoked.  Performed at [facility], [address], [place], [place] [address]   Urine Culture     Status: Abnormal   Collection Time: 07/04/22  8:31 PM   Specimen: Urine, Clean Catch  Result Value Ref Range Status   Specimen Description URINE, CLEAN CATCH  Final   Special Requests NONE Reflexed from [id number]  Final   Culture (A)  Final    >=100,000 COLONIES/mL KLEBSIELLA PNEUMONIAE 50,000 COLONIES/mL CORYNEBACTERIUM SPECIES Standardized susceptibility testing for this organism is not available. Performed at [facility], [address], [place], [place] [address]    Report Status 07/07/2022 FINAL  Final   Organism ID, Bacteria KLEBSIELLA PNEUMONIAE (A)  Final      Susceptibility   Klebsiella pneumoniae -  MIC*    AMPICILLIN RESISTANT Resistant     CEFAZOLIN <=4 SENSITIVE Sensitive     CEFEPIME <=0.12 SENSITIVE Sensitive     CEFTRIAXONE <=0.25 SENSITIVE Sensitive     CIPROFLOXACIN <=0.25 SENSITIVE Sensitive     GENTAMICIN <=1 SENSITIVE Sensitive     IMIPENEM <=0.25 SENSITIVE Sensitive     NITROFURANTOIN 64 INTERMEDIATE Intermediate     TRIMETH/SULFA <=20 SENSITIVE Sensitive     AMPICILLIN/SULBACTAM <=2 SENSITIVE Sensitive     PIP/TAZO <=4 SENSITIVE Sensitive     * >=100,000 COLONIES/mL KLEBSIELLA PNEUMONIAE  Blood Culture (routine x 2)     Status: None (Preliminary result)   Collection Time: 07/05/22  1:19 AM   Specimen: BLOOD  RIGHT HAND  Result Value Ref Range Status   Specimen Description BLOOD RIGHT HAND  Final   Special Requests   Final    BOTTLES DRAWN AEROBIC ONLY Blood Culture adequate volume   Culture   Final    NO GROWTH 4 DAYS Performed at [facility], [address], [place], [place] [address]    Report Status PENDING  Incomplete     Time coordinating discharge: 35 minutes  SIGNED:   Erick Blinks, DO [facility] 07/10/2022, 9:09 AM  If 7PM-7AM, please contact night-coverage [email or web address]

## 2022-07-11 ENCOUNTER — Telehealth: Payer: Self-pay

## 2022-07-11 NOTE — Transitions of Care (Post Inpatient/ED Visit) (Signed)
   07/11/2022  Name: Lori [name] MRN: [id number] DOB: [birth year]  Today's TOC FU Call Status:    Attempted to reach the patient regarding the most recent Inpatient/ED visit.  Follow Up Plan: Additional outreach attempts will be made to reach the patient to complete the Transitions of Care (Post Inpatient/ED visit) call.   [facility]

## 2022-07-19 ENCOUNTER — Telehealth: Payer: Self-pay | Admitting: Physical Medicine and Rehabilitation

## 2022-07-19 NOTE — Telephone Encounter (Signed)
Ryan from [facility] called and left a voicemail wanting to check on some of Angela's prescriptions. Call back number is [phone] ext #[phone]

## 2022-07-22 NOTE — Telephone Encounter (Signed)
I have called- they wanted to know if on baclofen AND flexeril- explained baclofen is scheduled and flexeril is prn- thanks- ML

## 2022-07-23 ENCOUNTER — Encounter: Payer: Self-pay | Admitting: Family

## 2022-07-29 ENCOUNTER — Telehealth: Payer: Self-pay

## 2022-07-29 NOTE — Telephone Encounter (Signed)
[facility] called for verbal orders for medication review and education with a home nurse as well as speech therapy

## 2022-07-30 ENCOUNTER — Other Ambulatory Visit: Payer: Self-pay

## 2022-07-30 DIAGNOSIS — E1169 Type 2 diabetes mellitus with other specified complication: Secondary | ICD-10-CM

## 2022-08-01 ENCOUNTER — Telehealth: Payer: Self-pay

## 2022-08-01 DIAGNOSIS — J4520 Mild intermittent asthma, uncomplicated: Secondary | ICD-10-CM

## 2022-08-01 NOTE — Telephone Encounter (Signed)
Rx for nebulizer signed.  If they want a pill pack they will have to go through the pharmacy to see if they will do this.

## 2022-08-01 NOTE — Telephone Encounter (Signed)
Beth w/ [facility] was advised and order was faxed.

## 2022-08-01 NOTE — Telephone Encounter (Signed)
Beth w/ [facility] called to see if patient could have an order placed for nebulizer machine. Beth reports that she saw Dinah Ngetich,NP had ordered the solution but patient doesn't have the machine.She also reports that patient was not taking her Xarelto 10 mg after getting out the [facility] and she advised husband that patient needs to take this medication regularly to prevent another stroke. Beth reports patient was complaining od numbness and she noticed patient leaning to one side and thing that might be the result of the numbness.  She would like to know if patient could get all medications placed in a pill pack with insurance will cover because patient would benefit from it so patient want continue to miss medications that she needs. Beth was advised that her or patient could give the office a call back with the pharmacy name to send medications to. Beth was advised that Richarda Blade, NP is out the office and message will be send to another NP that is covering for Dinah.

## 2022-08-01 NOTE — Addendum Note (Signed)
Addended by: Sharon Seller on: 08/01/2022 01:40 PM   Modules accepted: Orders

## 2022-08-02 NOTE — Telephone Encounter (Addendum)
Spoke with patient. Patient informed of response from Abbey Chatters, NP She stated that she was informed by the insurance company that they will pay for a machine. She asked if we could send it to a different company. I informed patient that if she would like she could contact insurance company to see if there was another company to send the order to. She verbalized her understanding and agreed. She will give our office a call back with this information.

## 2022-08-02 NOTE — Telephone Encounter (Signed)
Bara with [employer] called stating that the order for nebulizer machine was denied because patient just received nebulizer machine in September and insurance is not going to pay for another one since it has been last than 2 years. They will pay for a new machine if old one is broken and will also pay if patient just needs parts for the machine.  Message sent to Abbey Chatters, NP and Richarda Blade, NP

## 2022-08-02 NOTE — Telephone Encounter (Signed)
She can order one online Risk manager) or go to medical supply store

## 2022-08-06 ENCOUNTER — Other Ambulatory Visit: Payer: Self-pay

## 2022-08-06 DIAGNOSIS — F419 Anxiety disorder, unspecified: Secondary | ICD-10-CM

## 2022-08-06 MED ORDER — FREESTYLE LIBRE 14 DAY SENSOR MISC: 1.0000 | 12 refills | Status: DC | PRN

## 2022-08-06 MED ORDER — LORAZEPAM 0.5 MG PO TABS: 0.5000 mg | ORAL_TABLET | Freq: Every day | ORAL | 0 refills | Status: DC

## 2022-08-06 NOTE — Telephone Encounter (Signed)
Beth, Nurse with [facility] called stating that patient needs refill on Lorazepam and freestyle Libre sensor  Lorazepam was last refilled 07/10/22. Freestyle Josephine Igo is not on patient's active medication list.   Prescriptions pended and sent to Abbey Chatters, NP for approval/refusal

## 2022-08-09 ENCOUNTER — Telehealth: Payer: Self-pay

## 2022-08-09 DIAGNOSIS — J4520 Mild intermittent asthma, uncomplicated: Secondary | ICD-10-CM

## 2022-08-09 DIAGNOSIS — R2681 Unsteadiness on feet: Secondary | ICD-10-CM

## 2022-08-09 DIAGNOSIS — Z8673 Personal history of transient ischemic attack (TIA), and cerebral infarction without residual deficits: Secondary | ICD-10-CM

## 2022-08-09 NOTE — Telephone Encounter (Signed)
Patient has upcoming appointment on 08/14/2022. Patient states that her wheelchair was lost while she was in rehabilitation and now she is at home and needs one. Also states that her nebulizer machine is broken. Patients insurance called [employer] and requested a DME for above items. Below is the information for her new equipment company as she no longer wants to use [employer] .her insurance through [employer]/medicare said that she is approved to use this company.  [employer] Phone: [phone] Fax: [phone]  [employer] phone: [phone]

## 2022-08-09 NOTE — Telephone Encounter (Signed)
Wheelchair and Nebulizer ordered as requested.

## 2022-08-14 ENCOUNTER — Encounter: Payer: Self-pay | Admitting: Family

## 2022-08-14 ENCOUNTER — Ambulatory Visit (INDEPENDENT_AMBULATORY_CARE_PROVIDER_SITE_OTHER): Payer: 59 | Admitting: Family

## 2022-08-14 VITALS — BP 100/80 | HR 88 | Temp 97.8°F | Resp 18 | Ht 62.0 in | Wt 195.0 lb

## 2022-08-14 DIAGNOSIS — R2681 Unsteadiness on feet: Secondary | ICD-10-CM

## 2022-08-14 DIAGNOSIS — Z1211 Encounter for screening for malignant neoplasm of colon: Secondary | ICD-10-CM

## 2022-08-14 DIAGNOSIS — J4520 Mild intermittent asthma, uncomplicated: Secondary | ICD-10-CM

## 2022-08-14 DIAGNOSIS — E1169 Type 2 diabetes mellitus with other specified complication: Secondary | ICD-10-CM

## 2022-08-14 DIAGNOSIS — I10 Essential (primary) hypertension: Secondary | ICD-10-CM | POA: Diagnosis not present

## 2022-08-14 DIAGNOSIS — Z8673 Personal history of transient ischemic attack (TIA), and cerebral infarction without residual deficits: Secondary | ICD-10-CM

## 2022-08-14 DIAGNOSIS — E785 Hyperlipidemia, unspecified: Secondary | ICD-10-CM

## 2022-08-14 NOTE — Progress Notes (Signed)
Provider: Richarda Blade FNP-C  Sarp Vernier, Donalee Citrin, NP  Patient Care Team: Mitchel Delduca, Donalee Citrin, NP as PCP - General (Family Medicine) Orbie Pyo, MD as PCP - Cardiology (Cardiology) [facility]  Extended Emergency Contact Information Primary Emergency Contact: [name],[name] Mobile Phone: [phone] Relation: Spouse Preferred language: English Interpreter needed? No Secondary Emergency Contact: [name],[name] Home Phone: [phone] Mobile Phone: [phone] Relation: Daughter Mother: Blenda Peals Phone: [phone]  Code Status:  Full Code  Goals of care: Advanced Directive information    07/06/2022    9:16 PM  Advanced Directives  Would patient like information on creating a medical advance directive? No - Patient declined     Chief Complaint  Patient presents with   Follow-up    Patient is here for follow up after hospital stay/ and rehabilitation     HPI:  Pt is a 52 y.o. female seen today for an acute visit for Hospital from 07/04/2022 - 07/10/2022 for  hypotension.EMS was called in and was noted to be hypotensive B/p in the 80's.Her SBP in the ED was in the 90's. She was treated klebsiella Pneumonia with 5 days of IV Rocephin.Had itching with vancomycin required Benadryl. Condition improved was discharge to SNF. States was discharge from SNF on 07/27/2022. States feeling much better.  She is here with her husband.  She complains of generalized itchy dry skin has been applying triamcinolone cream with much improvement. CBG at home are in the 130's but did not bring her CBG log or glucometer to visit.States had x 1 episode CBG was 49 due to skipping meal states forgot to eat.she denies any signs of hypoglycemia.Has care giver who assist with her ADL's  daily.    Past Medical History:  Diagnosis Date   CAD (coronary artery disease)    Depression    Diabetes mellitus without complication (HCC)    History of CT scan    History of  mammogram    History of MRI    Hypertension    Pheochromocytoma    Pheochromocytoma    Stroke (HCC)    Thyroid disease    Past Surgical History:  Procedure Laterality Date   ABDOMINAL HYSTERECTOMY     CESAREAN SECTION     3   CORONARY ARTERY BYPASS GRAFT     Right adrenal gland removal for pheochromocytoma Right     Allergies  Allergen Reactions   Contrast Media [Iodinated Contrast Media] Anaphylaxis and Swelling   Penicillins Swelling    Mouth swells up and eyes swollen shut   Sumatriptan Anaphylaxis   Vancomycin Other (See Comments)    Red Man's Syndrome   Aspirin Swelling   Ciprofloxacin     Feel nausea/vomitting   Morphine Swelling    Outpatient Encounter Medications as of 08/14/2022  Medication Sig   albuterol (PROVENTIL) (2.5 MG/3ML) 0.083% nebulizer solution Take 3 mLs (2.5 mg total) by nebulization every 6 (six) hours as needed for wheezing or shortness of breath.   albuterol (VENTOLIN HFA) 108 (90 Base) MCG/ACT inhaler Inhale 2 puffs into the lungs every 6 (six) hours as needed for wheezing or shortness of breath.   amLODipine (NORVASC) 5 MG tablet Take 5 mg by mouth daily.   atorvastatin (LIPITOR) 80 MG tablet Take 1 tablet (80 mg total) by mouth daily.   baclofen (LIORESAL) 10 MG tablet Take 1 tablet (10 mg total) by mouth 3 (three) times daily. X 1 week, then 10 mg TID-for spasticity- can increase  the dose if you  call me to discuss   blood glucose meter kit and supplies 1 each by Other route as directed. Dispense based on patient and insurance preference. Use up to four times daily as directed. (FOR ICD-10 E10.9, E11.9).   budesonide (PULMICORT) 0.25 MG/2ML nebulizer solution Take 2 mLs (0.25 mg total) by nebulization 2 (two) times daily.   clopidogrel (PLAVIX) 75 MG tablet Take 1 tablet (75 mg total) by mouth daily.   Continuous Glucose Sensor (FREESTYLE LIBRE 14 DAY SENSOR) MISC 1 each by Does not apply route as needed. DX: E11.69   cyclobenzaprine (FLEXERIL) 5  MG tablet Take 1 tablet (5 mg total) by mouth 3 (three) times daily as needed for muscle spasms.   diclofenac Sodium (VOLTAREN) 1 % GEL Apply 4 g topically 4 (four) times daily.   escitalopram (LEXAPRO) 20 MG tablet Take 1 tablet (20 mg total) by mouth every morning.   ezetimibe (ZETIA) 10 MG tablet Take 1 tablet (10 mg total) by mouth daily.   gabapentin (NEURONTIN) 100 MG capsule Take 1 capsule (100 mg total) by mouth 3 (three) times daily.   guaiFENesin (MUCINEX) 600 MG 12 hr tablet Take 1 tablet (600 mg total) by mouth 2 (two) times daily.   HYDROcodone-acetaminophen (NORCO/VICODIN) 5-325 MG tablet Take 1 tablet by mouth every 6 (six) hours as needed for moderate pain.   insulin aspart (NOVOLOG) 100 UNIT/ML FlexPen Inject 15 Units into the skin 3 (three) times daily with meals.   insulin glargine (LANTUS) 100 UNIT/ML Solostar Pen Inject 40 Units into the skin at bedtime.   Insulin Pen Needle 32G X 4 MM MISC Use to inject insulin 4 times daily as directed.   KLAYESTA powder Apply 1 Application topically 2 (two) times daily. Affected areas on breast fold, groin, and sacral areas.   LORazepam (ATIVAN) 0.5 MG tablet Take 1 tablet (0.5 mg total) by mouth at bedtime.   methimazole (TAPAZOLE) 5 MG tablet Take 1 tablet (5 mg total) by mouth every morning.   metoprolol tartrate (LOPRESSOR) 100 MG tablet TAKE 1 TABLET BY MOUTH TWICE DAILY (BREAKFAST, BEDTIME)   nystatin cream (MYCOSTATIN) Apply 1 Application topically 2 (two) times daily. Affected areas on breast fold and groin and sacral areas   olmesartan-hydrochlorothiazide (BENICAR HCT) 40-25 MG tablet Take 1 tablet by mouth daily.   pantoprazole (PROTONIX) 40 MG tablet TAKE 1 TABLET BY MOUTH EVERY MORNING   polyethylene glycol powder (GLYCOLAX/MIRALAX) 17 GM/SCOOP powder Take 17 g by mouth daily. Hold for loose stool   rivaroxaban (XARELTO) 10 MG TABS tablet Take 1 tablet (10 mg total) by mouth daily.   tamsulosin (FLOMAX) 0.4 MG CAPS capsule TAKE  1 CAPSULE BY MOUTH AT BEDTIME   topiramate (TOPAMAX) 25 MG tablet Take 1 tablet (25 mg total) by mouth 2 (two) times daily.   triamcinolone ointment (KENALOG) 0.5 % Apply 1 Application topically 2 (two) times daily.   zinc oxide (BALMEX) 11.3 % CREA cream Apply 1 Application topically 2 (two) times daily.   No facility-administered encounter medications on file as of 08/14/2022.    Review of Systems  Constitutional:  Negative for appetite change, chills, fatigue, fever and unexpected weight change.  HENT:  Negative for congestion, dental problem, ear discharge, ear pain, facial swelling, hearing loss, nosebleeds, postnasal drip, rhinorrhea, sinus pressure, sinus pain, sneezing, sore throat, tinnitus and trouble swallowing.   Eyes:  Negative for pain, discharge, redness, itching and visual disturbance.  Respiratory:  Negative for cough, chest tightness, shortness of breath and  wheezing.   Cardiovascular:  Negative for chest pain, palpitations and leg swelling.  Gastrointestinal:  Negative for abdominal distention, abdominal pain, blood in stool, constipation, diarrhea, nausea and vomiting.  Endocrine: Negative for cold intolerance, heat intolerance, polydipsia, polyphagia and polyuria.  Genitourinary:  Negative for difficulty urinating, dysuria, flank pain, frequency and urgency.  Musculoskeletal:  Positive for gait problem. Negative for arthralgias, back pain, joint swelling, myalgias, neck pain and neck stiffness.  Skin:  Negative for color change, pallor, rash and wound.  Neurological:  Positive for numbness. Negative for dizziness, syncope, speech difficulty, weakness, light-headedness and headaches.       Left hand contracture   Hematological:  Does not bruise/bleed easily.  Psychiatric/Behavioral:  Negative for agitation, behavioral problems, confusion, hallucinations, self-injury, sleep disturbance and suicidal ideas. The patient is not nervous/anxious.      There is no immunization  history on file for this patient. Pertinent  Health Maintenance Due  Topic Date Due   FOOT EXAM  Never done   OPHTHALMOLOGY EXAM  Never done   Colonoscopy  Never done   INFLUENZA VACCINE  09/05/2022   HEMOGLOBIN A1C  11/19/2022   PAP SMEAR-Modifier  Discontinued      01/23/2022    1:54 PM 03/05/2022    2:19 PM 03/29/2022   12:55 PM 04/08/2022   12:50 PM 05/29/2022   11:04 AM  Fall Risk  Falls in the past year? 0 1 0 1 0  Was there an injury with Fall? 0 1 0 0 0  Fall Risk Category Calculator 0 3 0 2 0  Fall Risk Category (Retired) Low      (RETIRED) Patient Fall Risk Level Moderate fall risk      Patient at Risk for Falls Due to History of fall(s);Impaired balance/gait;Impaired mobility History of fall(s);Impaired balance/gait;Impaired mobility   History of fall(s)  Fall risk Follow up Falls evaluation completed Falls evaluation completed;Education provided;Falls prevention discussed   Falls evaluation completed   Functional Status Survey:    Vitals:   08/14/22 1534  BP: 100/80  Pulse: 88  Resp: 18  Temp: 97.8 F (36.6 C)  SpO2: 98%  Weight: 195 lb (88.5 kg)  Height: 5\' 2"  (1.575 m)   Body mass index is 35.67 kg/m. Physical Exam Vitals reviewed.  Constitutional:      General: She is not in acute distress.    Appearance: Normal appearance. She is normal weight. She is not ill-appearing or diaphoretic.  HENT:     Head: Normocephalic.     Right Ear: Tympanic membrane, ear canal and external ear normal. There is no impacted cerumen.     Left Ear: Tympanic membrane, ear canal and external ear normal. There is no impacted cerumen.     Nose: Nose normal. No congestion or rhinorrhea.     Mouth/Throat:     Mouth: Mucous membranes are moist.     Pharynx: Oropharynx is clear. No oropharyngeal exudate or posterior oropharyngeal erythema.  Eyes:     General: No scleral icterus.       Right eye: No discharge.        Left eye: No discharge.     Extraocular Movements:  Extraocular movements intact.     Conjunctiva/sclera: Conjunctivae normal.     Pupils: Pupils are equal, round, and reactive to light.  Neck:     Vascular: No carotid bruit.  Cardiovascular:     Rate and Rhythm: Normal rate and regular rhythm.     Pulses: Normal pulses.  Heart sounds: Normal heart sounds. No murmur heard.    No friction rub. No gallop.  Pulmonary:     Effort: Pulmonary effort is normal. No respiratory distress.     Breath sounds: Normal breath sounds. No wheezing, rhonchi or rales.  Chest:     Chest wall: No tenderness.  Abdominal:     General: Bowel sounds are normal. There is no distension.     Palpations: Abdomen is soft. There is no mass.     Tenderness: There is no abdominal tenderness. There is no right CVA tenderness, left CVA tenderness, guarding or rebound.  Musculoskeletal:        General: No swelling or tenderness. Normal range of motion.     Cervical back: Normal range of motion. No rigidity or tenderness.     Right lower leg: No edema.     Left lower leg: No edema.     Comments: Left hand contracture  Lymphadenopathy:     Cervical: No cervical adenopathy.  Skin:    General: Skin is warm and dry.     Coloration: Skin is not pale.     Findings: No bruising, erythema, lesion or rash.  Neurological:     Mental Status: She is alert and oriented to person, place, and time.     Cranial Nerves: No cranial nerve deficit.     Sensory: No sensory deficit.     Motor: No weakness.     Coordination: Coordination normal.     Gait: Gait abnormal.  Psychiatric:        Mood and Affect: Mood normal.        Behavior: Behavior normal.        Thought Content: Thought content normal.        Judgment: Judgment normal.     Comments: Chronic speech impairment      Labs reviewed: Recent Labs    11/03/21 0811 11/04/21 0317 11/05/21 0259 11/06/21 0307 07/07/22 0136 07/08/22 0214 07/10/22 0142  NA  --  137 138   < > 138 141 136  K  --  3.9 3.3*   < > 4.2  4.0 4.3  CL  --  112* 112*   < > 108 110 104  CO2  --  18* 20*   < > 24 22 20*  GLUCOSE  --  233* 113*   < > 135* 86 183*  BUN  --  18 20   < > 20 18 22*  CREATININE  --  0.64 0.69   < > 0.90 0.98 1.13*  CALCIUM  --  8.0* 8.3*   < > 8.1* 8.1* 8.5*  MG 1.7 2.2 2.1  --   --   --   --    < > = values in this interval not displayed.   Recent Labs    05/01/22 0200 05/22/22 0229 07/04/22 1954  AST 11* 15 15  ALT 13 15 17   ALKPHOS 89 90 117  BILITOT 0.8 0.4 0.6  PROT 6.6 7.0 8.1  ALBUMIN 3.2* 3.3* 3.7   Recent Labs    05/20/22 1053 05/22/22 0229 07/04/22 2122 07/05/22 0121 07/07/22 0136 07/08/22 0214 07/10/22 0142  WBC 7.1 8.8 8.6   < > 7.9 7.7 8.6  NEUTROABS 4,288 4.2 5.1  --   --   --   --   HGB 13.2 12.8 11.8*   < > 10.8* 10.2* 11.3*  HCT 40.8 38.1 36.1   < > 33.4* 32.0* 35.0*  MCV 80.0  77.6* 80.9   < > 81.3 81.0 81.0  PLT 364 346 361   < > 333 319 398   < > = values in this interval not displayed.   Lab Results  Component Value Date   TSH 1.865 04/30/2022   Lab Results  Component Value Date   HGBA1C 11.3 (H) 05/20/2022   Lab Results  Component Value Date   CHOL 148 03/01/2022   HDL 44 03/01/2022   LDLCALC 79 03/01/2022   TRIG 143 03/01/2022   CHOLHDL 3.4 03/01/2022    Significant Diagnostic Results in last 30 days:  No results found.  Assessment/Plan 1. Type 2 diabetes mellitus with hyperlipidemia (HCC) Lab Results  Component Value Date   HGBA1C 11.3 (H) 05/20/2022  CBG at home in the 130's  -Continue NovoLog and Lantus -Dietary modification advised - Ambulatory referral to Ophthalmology - Ambulatory referral to Podiatry  2. Essential hypertension Status post hospitalization and rehab due to hypotension.  Blood pressure has improved -Continue on amlodipine, Metroprolol, olmesartan-hydrochlorothiazide  3. Mild intermittent asthma without complication Symptoms controlled continue on albuterol and budesonide nebulizer  4. Unsteady gait Fall and  safety precaution advised  5. History of CVA (cerebrovascular accident) Left hand contracture -Continue to control high risk factors -Continue on atorvastatin and Plavix  6. Colon cancer screening Asymptomatic Cologuard versus colonoscopy discussed patient prefers colonoscopy. Will refer to gastroenterology.Made aware specialist office will call him to schedule appointment. - Ambulatory referral to Gastroenterology  Family/ staff Communication: Reviewed plan of care with patient and husband verbalized understanding  Labs/tests ordered: Has upcoming appointment for fasting lab work  Next Appointment: Return if symptoms worsen or fail to improve.   Caesar Bookman, NP

## 2022-08-17 ENCOUNTER — Other Ambulatory Visit: Payer: Self-pay | Admitting: Family

## 2022-08-19 ENCOUNTER — Telehealth: Payer: Self-pay

## 2022-08-19 NOTE — Telephone Encounter (Signed)
Message left on clinical intake voicemail:   Beth with [facility] home health called to request verbal orders for 3 more weeks, to fill patients pill bottles.   Call returned to [facility] and I left a detailed message authorizing orders (per [employer] standing protocol). Message will be sent to patients PCP as a FYI

## 2022-08-19 NOTE — Telephone Encounter (Signed)
 Noted  

## 2022-08-19 NOTE — Telephone Encounter (Signed)
Medication has High Risk Warnings. Medication pend and sent to PCP Ngetich, Donalee Citrin, NP

## 2022-08-20 ENCOUNTER — Telehealth: Payer: 59 | Admitting: *Deleted

## 2022-08-20 ENCOUNTER — Other Ambulatory Visit: Payer: Self-pay | Admitting: Family

## 2022-08-20 DIAGNOSIS — E1169 Type 2 diabetes mellitus with other specified complication: Secondary | ICD-10-CM

## 2022-08-20 MED ORDER — INSULIN ASPART 100 UNIT/ML FLEXPEN
15.0000 [IU] | PEN_INJECTOR | Freq: Three times a day (TID) | SUBCUTANEOUS | 1 refills | Status: DC
Start: 2022-08-20 — End: 2022-09-16

## 2022-08-20 NOTE — Telephone Encounter (Signed)
Lori [name], [facility] Case Manager, called and stated that she spoke with patient this morning and patient stated that she does not want to take her Novolog insulin, 15 units tid, because it is expired.   Requesting a new Rx to be sent to pharmacy.  Pended Rx and sent to [facility] for approval.

## 2022-08-21 ENCOUNTER — Telehealth: Payer: Self-pay

## 2022-08-21 NOTE — Telephone Encounter (Signed)
Patient called yesterday around 4:45 pm and left a message stating her insurance company does not cover novolog 70/30 and she needs the regular novolog called in.  I returned call to patient this morning and informed her that Ngetich, Dinah C, NP sent a rx for what appears to be the regular novolog flex pen. Patient verbalized understanding.

## 2022-08-21 NOTE — Telephone Encounter (Signed)
Care coordinator with [facility] called with patient on 3-way to check the status of wheelchair and nebulizer order. According to phone note dated 08/09/22 orders were faxed to [facility].   Per the care coordinator they called [employer] prior to calling us and they have not received orders. Orders were re-submitted   [employer] Phone: [phone] Fax: [phone]

## 2022-08-22 ENCOUNTER — Other Ambulatory Visit: Payer: Self-pay | Admitting: Family

## 2022-08-22 ENCOUNTER — Telehealth: Payer: Self-pay | Admitting: *Deleted

## 2022-08-22 DIAGNOSIS — J4520 Mild intermittent asthma, uncomplicated: Secondary | ICD-10-CM

## 2022-08-22 DIAGNOSIS — F32A Depression, unspecified: Secondary | ICD-10-CM

## 2022-08-22 DIAGNOSIS — E1169 Type 2 diabetes mellitus with other specified complication: Secondary | ICD-10-CM

## 2022-08-22 DIAGNOSIS — E1159 Type 2 diabetes mellitus with other circulatory complications: Secondary | ICD-10-CM

## 2022-08-22 DIAGNOSIS — E0590 Thyrotoxicosis, unspecified without thyrotoxic crisis or storm: Secondary | ICD-10-CM

## 2022-08-22 DIAGNOSIS — K5901 Slow transit constipation: Secondary | ICD-10-CM

## 2022-08-22 NOTE — Telephone Encounter (Signed)
Patient called and stated that her husband took her blood pressure this morning BEFORE taking any medications and it was 98/46, no other symptoms noted.   Patient wants to know what to do.  Please Advise.

## 2022-08-22 NOTE — Telephone Encounter (Signed)
Hold medication if blood pressure is less than 100/60

## 2022-08-23 ENCOUNTER — Other Ambulatory Visit: Payer: 59

## 2022-08-23 NOTE — Telephone Encounter (Signed)
Patient notified and agreed.  

## 2022-08-25 ENCOUNTER — Other Ambulatory Visit: Payer: Self-pay | Admitting: Family

## 2022-08-26 ENCOUNTER — Other Ambulatory Visit: Payer: 59

## 2022-08-27 ENCOUNTER — Other Ambulatory Visit: Payer: 59

## 2022-08-27 ENCOUNTER — Other Ambulatory Visit: Payer: Self-pay

## 2022-08-27 DIAGNOSIS — R051 Acute cough: Secondary | ICD-10-CM

## 2022-08-27 DIAGNOSIS — K219 Gastro-esophageal reflux disease without esophagitis: Secondary | ICD-10-CM

## 2022-08-27 DIAGNOSIS — F419 Anxiety disorder, unspecified: Secondary | ICD-10-CM

## 2022-08-27 MED ORDER — LORAZEPAM 0.5 MG PO TABS
0.5000 mg | ORAL_TABLET | Freq: Every day | ORAL | 0 refills | Status: DC
Start: 2022-08-27 — End: 2022-12-06

## 2022-08-27 MED ORDER — OMEPRAZOLE 20 MG PO CPDR: 20.0000 mg | DELAYED_RELEASE_CAPSULE | Freq: Every day | ORAL | 1 refills | Status: DC

## 2022-08-27 MED ORDER — GABAPENTIN 100 MG PO CAPS
100.0000 mg | ORAL_CAPSULE | Freq: Three times a day (TID) | ORAL | 1 refills | Status: DC
Start: 2022-08-27 — End: 2022-09-02

## 2022-08-27 MED ORDER — AMLODIPINE BESYLATE 5 MG PO TABS: 5.0000 mg | ORAL_TABLET | Freq: Every day | ORAL | 1 refills | Status: DC

## 2022-08-27 MED ORDER — TAMSULOSIN HCL 0.4 MG PO CAPS: 0.4000 mg | ORAL_CAPSULE | Freq: Every day | ORAL | 1 refills | Status: DC

## 2022-08-27 MED ORDER — RIVAROXABAN 10 MG PO TABS: 10.0000 mg | ORAL_TABLET | Freq: Every day | ORAL | 1 refills | Status: DC

## 2022-08-27 MED ORDER — ESCITALOPRAM OXALATE 20 MG PO TABS
20.0000 mg | ORAL_TABLET | Freq: Every morning | ORAL | 1 refills | Status: DC
Start: 2022-08-27 — End: 2022-09-02

## 2022-08-27 NOTE — Telephone Encounter (Signed)
Medication refilled

## 2022-08-27 NOTE — Telephone Encounter (Signed)
Incoming call received from Brandon with [facility] in an attempt to get patient on track with her medication refills. Below are concerns that populated while attempting to provide refills. Other medications were sent as requested to [facility].   1.) High risk or very high risk warning populated when attempting to refill Xarelto. RX request sent to PCP for review and approval if warranted. RX pended for review and approval  2.) Patient needs a refill on lorazepam. RX last refilled on 08/06/22 # 10 tablets with no refills. No treatment agreement is on file, notation made on pending appointment for October 2024. RX pended for review and approval  3.) Patient needs a refill on Omeprazole 20 mg once daily, which is not on her medication list. Patient is taking this instead of pantoprazole. Medication was changed when patient was at a facility temporarily. Please advise

## 2022-08-28 ENCOUNTER — Telehealth: Payer: Self-pay

## 2022-08-28 LAB — HEMOGLOBIN A1C
Hgb A1c MFr Bld: 9.8 % of total Hgb — ABNORMAL HIGH (ref <5.7)
Mean Plasma Glucose: 235 mg/dL
eAG (mmol/L): 13 mmol/L

## 2022-08-28 NOTE — Telephone Encounter (Signed)
Another message was left on clinical intake voicemail relaying the same information

## 2022-08-28 NOTE — Telephone Encounter (Signed)
Spoke with Deirdre and relayed Ngetich, Dinah C, NP response

## 2022-08-28 NOTE — Telephone Encounter (Signed)
Deirdre with [facility] called as just an FYI. She states that patient reported falling yesterday transferring from vehicle to wheelchair after returning home from lab appointment yesterday. Patient reports no injury or pain.  Message sent to Richarda Blade, NP

## 2022-08-28 NOTE — Telephone Encounter (Signed)
Continue to monitor then notify provider's office for any change in condition or having any new onset of pain.

## 2022-09-02 ENCOUNTER — Other Ambulatory Visit: Payer: Self-pay

## 2022-09-02 DIAGNOSIS — F419 Anxiety disorder, unspecified: Secondary | ICD-10-CM

## 2022-09-02 DIAGNOSIS — E1159 Type 2 diabetes mellitus with other circulatory complications: Secondary | ICD-10-CM

## 2022-09-02 MED ORDER — METOPROLOL TARTRATE 100 MG PO TABS
ORAL_TABLET | ORAL | 1 refills | Status: DC
Start: 2022-09-02 — End: 2022-10-12

## 2022-09-02 MED ORDER — OLMESARTAN MEDOXOMIL-HCTZ 40-25 MG PO TABS: 1.0000 | ORAL_TABLET | Freq: Every day | ORAL | 1 refills | Status: DC

## 2022-09-02 MED ORDER — ESCITALOPRAM OXALATE 20 MG PO TABS
20.0000 mg | ORAL_TABLET | Freq: Every morning | ORAL | 1 refills | Status: DC
Start: 2022-09-02 — End: 2023-01-01

## 2022-09-02 MED ORDER — ATORVASTATIN CALCIUM 80 MG PO TABS: 80.0000 mg | ORAL_TABLET | Freq: Every day | ORAL | 1 refills | Status: DC

## 2022-09-02 MED ORDER — RIVAROXABAN 10 MG PO TABS: 10.0000 mg | ORAL_TABLET | Freq: Every day | ORAL | 1 refills | Status: DC

## 2022-09-02 MED ORDER — GABAPENTIN 100 MG PO CAPS
100.0000 mg | ORAL_CAPSULE | Freq: Three times a day (TID) | ORAL | 1 refills | Status: AC
Start: 2022-09-02 — End: ?

## 2022-09-02 MED ORDER — TAMSULOSIN HCL 0.4 MG PO CAPS: 0.4000 mg | ORAL_CAPSULE | Freq: Every day | ORAL | 1 refills | Status: DC

## 2022-09-02 MED ORDER — EZETIMIBE 10 MG PO TABS: 10.0000 mg | ORAL_TABLET | Freq: Every day | ORAL | 1 refills | Status: DC

## 2022-09-02 MED ORDER — AMLODIPINE BESYLATE 5 MG PO TABS: 5.0000 mg | ORAL_TABLET | Freq: Every day | ORAL | 1 refills | Status: DC

## 2022-09-02 MED ORDER — OMEPRAZOLE 20 MG PO CPDR: 20.0000 mg | DELAYED_RELEASE_CAPSULE | Freq: Every day | ORAL | 1 refills | Status: DC

## 2022-09-02 MED ORDER — CLOPIDOGREL BISULFATE 75 MG PO TABS: 75.0000 mg | ORAL_TABLET | Freq: Every day | ORAL | 1 refills | Status: DC

## 2022-09-02 NOTE — Telephone Encounter (Signed)
Angelique Blonder, nurse with [facility] called stating that patient needed refills on medications. A representative for the patient called last week requesting that Amlodipine,Gabapentin,Omeprazole, xerelto, escitalopram and flomax be sent to [facility] pharmacy (which they were). Prescriptions were previously sent to [facility] pharmacy. Angelique Blonder is calling today stating that patient didn't receive medication  Patient is now requesting that [facility] be [facility]..   High warnings came up when trying to refill medications. Medications pended and sent to Richarda Blade, NP

## 2022-09-03 ENCOUNTER — Ambulatory Visit (INDEPENDENT_AMBULATORY_CARE_PROVIDER_SITE_OTHER): Payer: 59 | Admitting: Podiatry

## 2022-09-03 DIAGNOSIS — Z91199 Patient's noncompliance with other medical treatment and regimen due to unspecified reason: Secondary | ICD-10-CM

## 2022-09-03 NOTE — Progress Notes (Signed)
No show

## 2022-09-05 DIAGNOSIS — Z7901 Long term (current) use of anticoagulants: Secondary | ICD-10-CM | POA: Diagnosis not present

## 2022-09-05 DIAGNOSIS — Z7951 Long term (current) use of inhaled steroids: Secondary | ICD-10-CM | POA: Diagnosis not present

## 2022-09-05 DIAGNOSIS — Z7984 Long term (current) use of oral hypoglycemic drugs: Secondary | ICD-10-CM | POA: Diagnosis not present

## 2022-09-05 DIAGNOSIS — Z9181 History of falling: Secondary | ICD-10-CM | POA: Diagnosis not present

## 2022-09-05 DIAGNOSIS — E1169 Type 2 diabetes mellitus with other specified complication: Secondary | ICD-10-CM | POA: Diagnosis not present

## 2022-09-05 DIAGNOSIS — I2510 Atherosclerotic heart disease of native coronary artery without angina pectoris: Secondary | ICD-10-CM | POA: Diagnosis not present

## 2022-09-05 DIAGNOSIS — J4520 Mild intermittent asthma, uncomplicated: Secondary | ICD-10-CM | POA: Diagnosis not present

## 2022-09-05 DIAGNOSIS — Z951 Presence of aortocoronary bypass graft: Secondary | ICD-10-CM | POA: Diagnosis not present

## 2022-09-05 DIAGNOSIS — I69354 Hemiplegia and hemiparesis following cerebral infarction affecting left non-dominant side: Secondary | ICD-10-CM | POA: Diagnosis not present

## 2022-09-05 DIAGNOSIS — Z794 Long term (current) use of insulin: Secondary | ICD-10-CM | POA: Diagnosis not present

## 2022-09-05 DIAGNOSIS — Z87440 Personal history of urinary (tract) infections: Secondary | ICD-10-CM | POA: Diagnosis not present

## 2022-09-05 DIAGNOSIS — E039 Hypothyroidism, unspecified: Secondary | ICD-10-CM | POA: Diagnosis not present

## 2022-09-05 DIAGNOSIS — I639 Cerebral infarction, unspecified: Secondary | ICD-10-CM | POA: Diagnosis not present

## 2022-09-05 DIAGNOSIS — I10 Essential (primary) hypertension: Secondary | ICD-10-CM | POA: Diagnosis not present

## 2022-09-05 DIAGNOSIS — I700 Atherosclerosis of aorta: Secondary | ICD-10-CM | POA: Diagnosis not present

## 2022-09-05 DIAGNOSIS — E785 Hyperlipidemia, unspecified: Secondary | ICD-10-CM | POA: Diagnosis not present

## 2022-09-06 ENCOUNTER — Other Ambulatory Visit: Payer: Self-pay

## 2022-09-06 ENCOUNTER — Emergency Department (HOSPITAL_COMMUNITY): Payer: 59

## 2022-09-06 ENCOUNTER — Telehealth: Payer: Self-pay

## 2022-09-06 ENCOUNTER — Emergency Department (HOSPITAL_COMMUNITY)
Admission: EM | Admit: 2022-09-06 | Discharge: 2022-09-07 | Disposition: A | Discharge status: Home / Self Care | Payer: 59 | Attending: Emergency Medicine | Admitting: Emergency Medicine

## 2022-09-06 DIAGNOSIS — Z7951 Long term (current) use of inhaled steroids: Secondary | ICD-10-CM | POA: Diagnosis not present

## 2022-09-06 DIAGNOSIS — Z743 Need for continuous supervision: Secondary | ICD-10-CM | POA: Diagnosis not present

## 2022-09-06 DIAGNOSIS — E109 Type 1 diabetes mellitus without complications: Secondary | ICD-10-CM | POA: Insufficient documentation

## 2022-09-06 DIAGNOSIS — J4520 Mild intermittent asthma, uncomplicated: Secondary | ICD-10-CM | POA: Diagnosis not present

## 2022-09-06 DIAGNOSIS — Z20822 Contact with and (suspected) exposure to covid-19: Secondary | ICD-10-CM | POA: Diagnosis not present

## 2022-09-06 DIAGNOSIS — I2510 Atherosclerotic heart disease of native coronary artery without angina pectoris: Secondary | ICD-10-CM | POA: Insufficient documentation

## 2022-09-06 DIAGNOSIS — E039 Hypothyroidism, unspecified: Secondary | ICD-10-CM | POA: Diagnosis not present

## 2022-09-06 DIAGNOSIS — I10 Essential (primary) hypertension: Secondary | ICD-10-CM | POA: Diagnosis not present

## 2022-09-06 DIAGNOSIS — E785 Hyperlipidemia, unspecified: Secondary | ICD-10-CM | POA: Diagnosis not present

## 2022-09-06 DIAGNOSIS — Z951 Presence of aortocoronary bypass graft: Secondary | ICD-10-CM | POA: Diagnosis not present

## 2022-09-06 DIAGNOSIS — R0602 Shortness of breath: Secondary | ICD-10-CM | POA: Diagnosis not present

## 2022-09-06 DIAGNOSIS — I700 Atherosclerosis of aorta: Secondary | ICD-10-CM | POA: Diagnosis not present

## 2022-09-06 DIAGNOSIS — Z794 Long term (current) use of insulin: Secondary | ICD-10-CM | POA: Diagnosis not present

## 2022-09-06 DIAGNOSIS — R062 Wheezing: Secondary | ICD-10-CM | POA: Diagnosis not present

## 2022-09-06 DIAGNOSIS — R739 Hyperglycemia, unspecified: Secondary | ICD-10-CM | POA: Diagnosis not present

## 2022-09-06 DIAGNOSIS — Z7984 Long term (current) use of oral hypoglycemic drugs: Secondary | ICD-10-CM | POA: Diagnosis not present

## 2022-09-06 DIAGNOSIS — R059 Cough, unspecified: Secondary | ICD-10-CM | POA: Diagnosis not present

## 2022-09-06 DIAGNOSIS — Z87440 Personal history of urinary (tract) infections: Secondary | ICD-10-CM | POA: Diagnosis not present

## 2022-09-06 DIAGNOSIS — R918 Other nonspecific abnormal finding of lung field: Secondary | ICD-10-CM | POA: Diagnosis not present

## 2022-09-06 DIAGNOSIS — I69354 Hemiplegia and hemiparesis following cerebral infarction affecting left non-dominant side: Secondary | ICD-10-CM | POA: Diagnosis not present

## 2022-09-06 DIAGNOSIS — J45901 Unspecified asthma with (acute) exacerbation: Secondary | ICD-10-CM | POA: Diagnosis not present

## 2022-09-06 DIAGNOSIS — Z9181 History of falling: Secondary | ICD-10-CM | POA: Diagnosis not present

## 2022-09-06 DIAGNOSIS — E1169 Type 2 diabetes mellitus with other specified complication: Secondary | ICD-10-CM | POA: Diagnosis not present

## 2022-09-06 DIAGNOSIS — I499 Cardiac arrhythmia, unspecified: Secondary | ICD-10-CM | POA: Diagnosis not present

## 2022-09-06 DIAGNOSIS — Z7901 Long term (current) use of anticoagulants: Secondary | ICD-10-CM | POA: Diagnosis not present

## 2022-09-06 LAB — BASIC METABOLIC PANEL
Anion gap: 15 (ref 5–15)
BUN: 28 mg/dL — ABNORMAL HIGH (ref 6–20)
CO2: 18 mmol/L — ABNORMAL LOW (ref 22–32)
Calcium: 8.6 mg/dL — ABNORMAL LOW (ref 8.9–10.3)
Chloride: 101 mmol/L (ref 98–111)
Creatinine, Ser: 1.38 mg/dL — ABNORMAL HIGH (ref 0.44–1.00)
GFR, Estimated: 47 mL/min — ABNORMAL LOW (ref >60)
Glucose, Bld: 366 mg/dL — ABNORMAL HIGH (ref 70–99)
Potassium: 3.5 mmol/L (ref 3.5–5.1)
Sodium: 134 mmol/L — ABNORMAL LOW (ref 135–145)

## 2022-09-06 LAB — TROPONIN I (HIGH SENSITIVITY): Troponin I (High Sensitivity): 4 ng/L (ref <18)

## 2022-09-06 LAB — CBC WITH DIFFERENTIAL/PLATELET
Abs Immature Granulocytes: 0.02 10*3/uL (ref 0.00–0.07)
Basophils Absolute: 0.1 10*3/uL (ref 0.0–0.1)
Basophils Relative: 1 %
Eosinophils Absolute: 0.3 10*3/uL (ref 0.0–0.5)
Eosinophils Relative: 3 %
HCT: 35.4 % — ABNORMAL LOW (ref 36.0–46.0)
Hemoglobin: 11.3 g/dL — ABNORMAL LOW (ref 12.0–15.0)
Immature Granulocytes: 0 %
Lymphocytes Relative: 41 %
Lymphs Abs: 3.8 10*3/uL (ref 0.7–4.0)
MCH: 24.9 pg — ABNORMAL LOW (ref 26.0–34.0)
MCHC: 31.9 g/dL (ref 30.0–36.0)
MCV: 78 fL — ABNORMAL LOW (ref 80.0–100.0)
Monocytes Absolute: 0.7 10*3/uL (ref 0.1–1.0)
Monocytes Relative: 7 %
Neutro Abs: 4.5 10*3/uL (ref 1.7–7.7)
Neutrophils Relative %: 48 %
Platelets: 318 10*3/uL (ref 150–400)
RBC: 4.54 MIL/uL (ref 3.87–5.11)
RDW: 15.4 % (ref 11.5–15.5)
WBC: 9.3 10*3/uL (ref 4.0–10.5)
nRBC: 0 % (ref 0.0–0.2)

## 2022-09-06 LAB — BRAIN NATRIURETIC PEPTIDE: B Natriuretic Peptide: 19.5 pg/mL (ref 0.0–100.0)

## 2022-09-06 LAB — SARS CORONAVIRUS 2 BY RT PCR: SARS Coronavirus 2 by RT PCR: NEGATIVE

## 2022-09-06 LAB — CBG MONITORING, ED: Glucose-Capillary: 381 mg/dL — ABNORMAL HIGH (ref 70–99)

## 2022-09-06 MED ORDER — IPRATROPIUM-ALBUTEROL 0.5-2.5 (3) MG/3ML IN SOLN
3.0000 mL | Freq: Once | RESPIRATORY_TRACT | Status: AC
  Administered 2022-09-06: 3 mL via RESPIRATORY_TRACT
  Filled 2022-09-06: qty 3

## 2022-09-06 NOTE — Telephone Encounter (Signed)
Continue with Gabapentin and baclofen for pain. - Patient to verify with pharmacy which insulin is covered.

## 2022-09-06 NOTE — ED Triage Notes (Signed)
Pt arrives to ED c/o SOB x 12 hours. Pt endorses 7 and half of albuterol through day. Received additional 5mg  with [facility]. PT w/ expiratory wheezes through all fields.Pt received. %mg of albuterol, 0.5 of Atrovent, and 125mg  of Soul Medrol per EMS with little improvement. Hx of stroke, asthma, bronchitis, and pneumonia.

## 2022-09-06 NOTE — ED Provider Notes (Signed)
[facility] Emergency Department Provider Note MRN:  [id number]  Arrival date & time: 09/07/22     Chief Complaint   Shortness of Breath   History of Present Illness   Lori [name] is a 52 y.o. year-old female presents to the ED with hx of CAD status post CABG, CVA with spastic hemiplegia, hypertension, insulin-dependent diabetes mellitus, hypothyroidism, depression, anxiety, pheochromocytoma status post adrenalectomy presenting with CC of SOB.  Patient states that the symptoms started this morning. She denies fevers, but states that she has had chills.    She was admitted to the hospital 2 months ago for UTI.  Patient has received magnesium, solumedrol, and atrovent from EMS without much improvement.  History provided by patient.   Review of Systems  Pertinent positive and negative review of systems noted in HPI.    Physical Exam   Vitals:   09/06/22 2315 09/07/22 0045  BP: 108/69 119/72  Pulse: (!) 107 (!) 109  Resp: (!) 25 (!) 22  Temp:    SpO2: 100% 96%    CONSTITUTIONAL:  chronically ill-appearing, NAD NEURO:  Alert and oriented x 3, CN 3-12 grossly intact EYES:  eyes equal and reactive ENT/NECK:  Supple, no stridor  CARDIO:  tachycardic, regular rhythm, appears well-perfused  PULM:  No respiratory distress, rhonchi present  GI/GU:  non-distended, no tender MSK/SPINE:  No gross deformities, no edema, LUE spastic paralysis  SKIN:  no rash, atraumatic   *Additional and/or pertinent findings included in MDM below  Diagnostic and Interventional Summary    EKG Interpretation Date/Time:  Saturday September 07 2022 00:45:19 EDT Ventricular Rate:  109 PR Interval:  188 QRS Duration:  89 QT Interval:  375 QTC Calculation: 505 R Axis:   69  Text Interpretation: Sinus tachycardia Nonspecific T abnormalities, diffuse leads Borderline prolonged QT interval Confirmed by Alona Bene [phone]) on 09/07/2022 12:55:26 AM       Labs Reviewed   CBC WITH DIFFERENTIAL/PLATELET - Abnormal; Notable for the following components:      Result Value   Hemoglobin 11.3 (*)    HCT 35.4 (*)    MCV 78.0 (*)    MCH 24.9 (*)    All other components within normal limits  BASIC METABOLIC PANEL - Abnormal; Notable for the following components:   Sodium 134 (*)    CO2 18 (*)    Glucose, Bld 366 (*)    BUN 28 (*)    Creatinine, Ser 1.38 (*)    Calcium 8.6 (*)    GFR, Estimated 47 (*)    All other components within normal limits  CBG MONITORING, ED - Abnormal; Notable for the following components:   Glucose-Capillary 381 (*)    All other components within normal limits  CBG MONITORING, ED - Abnormal; Notable for the following components:   Glucose-Capillary 406 (*)    All other components within normal limits  SARS CORONAVIRUS 2 BY RT PCR  BRAIN NATRIURETIC PEPTIDE  TROPONIN I (HIGH SENSITIVITY)  TROPONIN I (HIGH SENSITIVITY)    DG Chest Port 1 View  Final Result      Medications  ipratropium-albuterol (DUONEB) 0.5-2.5 (3) MG/3ML nebulizer solution 3 mL (3 mLs Nebulization Given 09/06/22 2304)  ipratropium-albuterol (DUONEB) 0.5-2.5 (3) MG/3ML nebulizer solution 3 mL (3 mLs Nebulization Given 09/06/22 2359)  insulin aspart (novoLOG) injection 10 Units (10 Units Subcutaneous Given 09/07/22 0014)  lactated ringers bolus 500 mL (500 mLs Intravenous New Bag/Given 09/07/22 0021)     Procedures  /  Critical Care Procedures  ED Course and Medical Decision Making  I have reviewed the triage vital signs, the nursing notes, and pertinent available records from the EMR.  Social Determinants Affecting Complexity of Care: Patient has no clinically significant social determinants affecting this chief complaint..   ED Course:    Medical Decision Making Patient here with SOB for the past 12 hours or so.  She has some wheezing on exam.  She states that she has used her inhaler several times without much improvement.  Was given albuterol and  solumedrol by EMS.  Glucose is elevated.  She states that she is out of her home insulin and is waiting for a refill from her PCP.  Notes in epic states that this is in process.  She is given a dose of 10 units SQ insulin.  Patient slightly tachycardic after nebs, but no longer wheezing.  O2 sat is normal.  EKG and trops are negative.  Doubt ACS.  CXR consistent with asthma or bronchitis.  Given risk factors, will cover with azithro and give prednisone.  Recommend close follow-up with PCP.  Amount and/or Complexity of Data Reviewed Labs: ordered. Radiology: ordered.  Risk Prescription drug management.         Consultants: No consultations were needed in caring for this patient.   Treatment and Plan: I considered admission due to patient's initial presentation, but after considering the examination and diagnostic results, patient will not require admission and can be discharged with outpatient follow-up.    Final Clinical Impressions(s) / ED Diagnoses     ICD-10-CM   1. Exacerbation of asthma, unspecified asthma severity, unspecified whether persistent  J45.901       ED Discharge Orders          Ordered    azithromycin (ZITHROMAX) 250 MG tablet  Daily        09/07/22 0203    predniSONE (DELTASONE) 20 MG tablet  Daily        09/07/22 0203              Discharge Instructions Discussed with and Provided to Patient:   Discharge Instructions   None      Roxy Horseman, PA-C 09/07/22 5366    Rondel Baton, MD 09/07/22 1159

## 2022-09-06 NOTE — Telephone Encounter (Signed)
Nurse with [facility] called stating that patient reported having pain at 8/10. She states that plan made to have patient take baclofen or gabapentin before the end of the day before pain gets too bad. She also states that patient's novolog was not approved by her insurance. She would like to  know if there is something that can be given in the place of novolog.  Message sent to Richarda Blade, NP

## 2022-09-07 ENCOUNTER — Encounter (HOSPITAL_COMMUNITY): Payer: Self-pay

## 2022-09-07 DIAGNOSIS — R531 Weakness: Secondary | ICD-10-CM | POA: Diagnosis not present

## 2022-09-07 DIAGNOSIS — Z7401 Bed confinement status: Secondary | ICD-10-CM | POA: Diagnosis not present

## 2022-09-07 DIAGNOSIS — J45901 Unspecified asthma with (acute) exacerbation: Secondary | ICD-10-CM | POA: Diagnosis not present

## 2022-09-07 LAB — CBG MONITORING, ED: Glucose-Capillary: 406 mg/dL — ABNORMAL HIGH (ref 70–99)

## 2022-09-07 LAB — TROPONIN I (HIGH SENSITIVITY): Troponin I (High Sensitivity): 5 ng/L (ref <18)

## 2022-09-07 MED ORDER — AZITHROMYCIN 250 MG PO TABS: 250.0000 mg | ORAL_TABLET | Freq: Every day | ORAL | 0 refills | Status: DC

## 2022-09-07 MED ORDER — LACTATED RINGERS IV BOLUS
500.0000 mL | Freq: Once | INTRAVENOUS | Status: AC
  Administered 2022-09-07: 500 mL via INTRAVENOUS

## 2022-09-07 MED ORDER — INSULIN ASPART 100 UNIT/ML IJ SOLN
10.0000 [IU] | Freq: Once | INTRAMUSCULAR | Status: AC
  Administered 2022-09-07: 10 [IU] via SUBCUTANEOUS

## 2022-09-07 MED ORDER — PREDNISONE 20 MG PO TABS: 40.0000 mg | ORAL_TABLET | Freq: Every day | ORAL | 0 refills | Status: DC

## 2022-09-07 NOTE — Discharge Instructions (Addendum)
Take medications as directed.  Continue to use your inhaler as needed.    Please monitor your blood sugar closely, the steroids given for your asthma can cause your blood sugar to be elevated.

## 2022-09-07 NOTE — ED Notes (Signed)
Pt awake and anxious, states "the Docie of death came to me and said I'm going to die and I'm scared." BP labile but all other vitals WNL.

## 2022-09-07 NOTE — ED Notes (Signed)
[facility] called for transport  to address on file.

## 2022-09-09 ENCOUNTER — Telehealth: Payer: Self-pay

## 2022-09-09 ENCOUNTER — Other Ambulatory Visit: Payer: Self-pay | Admitting: Family

## 2022-09-09 DIAGNOSIS — E785 Hyperlipidemia, unspecified: Secondary | ICD-10-CM | POA: Diagnosis not present

## 2022-09-09 DIAGNOSIS — J4520 Mild intermittent asthma, uncomplicated: Secondary | ICD-10-CM | POA: Diagnosis not present

## 2022-09-09 DIAGNOSIS — Z7984 Long term (current) use of oral hypoglycemic drugs: Secondary | ICD-10-CM | POA: Diagnosis not present

## 2022-09-09 DIAGNOSIS — E039 Hypothyroidism, unspecified: Secondary | ICD-10-CM | POA: Diagnosis not present

## 2022-09-09 DIAGNOSIS — E1169 Type 2 diabetes mellitus with other specified complication: Secondary | ICD-10-CM | POA: Diagnosis not present

## 2022-09-09 DIAGNOSIS — Z794 Long term (current) use of insulin: Secondary | ICD-10-CM | POA: Diagnosis not present

## 2022-09-09 DIAGNOSIS — I2510 Atherosclerotic heart disease of native coronary artery without angina pectoris: Secondary | ICD-10-CM | POA: Diagnosis not present

## 2022-09-09 DIAGNOSIS — Z7901 Long term (current) use of anticoagulants: Secondary | ICD-10-CM | POA: Diagnosis not present

## 2022-09-09 DIAGNOSIS — Z951 Presence of aortocoronary bypass graft: Secondary | ICD-10-CM | POA: Diagnosis not present

## 2022-09-09 DIAGNOSIS — Z7951 Long term (current) use of inhaled steroids: Secondary | ICD-10-CM | POA: Diagnosis not present

## 2022-09-09 DIAGNOSIS — I10 Essential (primary) hypertension: Secondary | ICD-10-CM | POA: Diagnosis not present

## 2022-09-09 DIAGNOSIS — I69354 Hemiplegia and hemiparesis following cerebral infarction affecting left non-dominant side: Secondary | ICD-10-CM | POA: Diagnosis not present

## 2022-09-09 DIAGNOSIS — Z87440 Personal history of urinary (tract) infections: Secondary | ICD-10-CM | POA: Diagnosis not present

## 2022-09-09 DIAGNOSIS — I700 Atherosclerosis of aorta: Secondary | ICD-10-CM | POA: Diagnosis not present

## 2022-09-09 DIAGNOSIS — Z9181 History of falling: Secondary | ICD-10-CM | POA: Diagnosis not present

## 2022-09-09 NOTE — Telephone Encounter (Signed)
Patient called and is needing a referral to SNF that takes patients at 50 years old Also had medication issues with being out of insulin and her insurance doesn't cover novolog and if there is another one she can be given that her insurance will take.( Short term insulin)

## 2022-09-09 NOTE — Telephone Encounter (Signed)
Deidra, Nurse with [facility] [phone] called requesting to speak with Evie regarding messages left from last week.  Forwarded to [employer].

## 2022-09-09 NOTE — Telephone Encounter (Signed)
I called and spoke with patient on 09/06/22 to continue gabapentin and baclofen. Also to contact pharmacy regarding novolog.

## 2022-09-09 NOTE — Telephone Encounter (Signed)
-   patient or POA will need to filled a facility then notify provider to fill out F [facility] 2 form I going to assisted Living facility.We do not refer to skilled Nursing Home usually that 's determined when a patient is in the hospital.  - Also very with your insurance or pharmacy which insulin is covered.

## 2022-09-10 ENCOUNTER — Encounter: Payer: 59 | Attending: Physical Medicine & Rehabilitation | Admitting: Physical Medicine & Rehabilitation

## 2022-09-10 ENCOUNTER — Telehealth: Payer: Self-pay

## 2022-09-10 ENCOUNTER — Encounter: Payer: Self-pay | Admitting: Physical Medicine & Rehabilitation

## 2022-09-10 VITALS — BP 107/75 | HR 89 | Temp 98.3°F | Resp 16 | Ht 62.0 in

## 2022-09-10 DIAGNOSIS — G8114 Spastic hemiplegia affecting left nondominant side: Secondary | ICD-10-CM | POA: Diagnosis not present

## 2022-09-10 DIAGNOSIS — I639 Cerebral infarction, unspecified: Secondary | ICD-10-CM | POA: Insufficient documentation

## 2022-09-10 MED ORDER — ONABOTULINUMTOXINA 100 UNITS IJ SOLR
400.0000 [IU] | Freq: Once | INTRAMUSCULAR | Status: AC
Start: 2022-09-10 — End: 2022-09-10
  Administered 2022-09-10: 400 [IU] via INTRAMUSCULAR

## 2022-09-10 NOTE — Patient Instructions (Signed)
Please take benadryl if you have itching of the skin

## 2022-09-10 NOTE — Telephone Encounter (Signed)
Please bring all medication to visit.

## 2022-09-10 NOTE — Telephone Encounter (Signed)
1.) Verbal order request for continued nursing 1 time a week for 2 weeks. I authorized verbal order request per [facility] standing protocol.   2.) Level 2 interaction between azithromycin and lexapro  3.) B/P was 130/90  4.) Patient with a lot of confusion surrounding medications which probably lead to her going to the ED

## 2022-09-10 NOTE — Transitions of Care (Post Inpatient/ED Visit) (Signed)
   09/10/2022  Name: Lori [name] MRN: [id number] DOB: [birth year]  Today's TOC FU Call Status: Today's TOC FU Call Status:: Unsuccessful Call (2nd Attempt) Unsuccessful Call (2nd Attempt) Date: 09/10/22  Attempted to reach the patient regarding the most recent Inpatient/ED visit.  Follow Up Plan: Additional outreach attempts will be made to reach the patient to complete the Transitions of Care (Post Inpatient/ED visit) call.   Signature : [facility]

## 2022-09-10 NOTE — Progress Notes (Signed)
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 This includes bleeding, bruising, infection, excessive weakness, or medication side effects. A REMS form is on file and signed. Needle: 27g 1" needle electrode Number of units per muscle Total dose 300U  LEFT Biceps50 Brachilais 75 FCR50 FCU0 FDS50 FDP50 Opponens pollicis 25 Lumbricals 75 PT 25 All injections were done after obtaining appropriate EMG activity and after negative drawback for blood. Pt with iohexol allergy when used IV , had topical betadine today examined skin ~15 min after procedure completed no itching or redness, residual betadine remove. Post procedure instructions given

## 2022-09-11 NOTE — Telephone Encounter (Signed)
Called patient and left a message with the details of this encounter and encouraged patient to return call to schedule a follow-up appointment and to bring all medications.

## 2022-09-11 NOTE — Telephone Encounter (Signed)
I called and spoke with Lori [name] on 09/09/22 she stated that she was with another client and will return my call.

## 2022-09-12 ENCOUNTER — Other Ambulatory Visit: Payer: Self-pay | Admitting: Family

## 2022-09-12 DIAGNOSIS — E1169 Type 2 diabetes mellitus with other specified complication: Secondary | ICD-10-CM

## 2022-09-13 DIAGNOSIS — Z87440 Personal history of urinary (tract) infections: Secondary | ICD-10-CM | POA: Diagnosis not present

## 2022-09-13 DIAGNOSIS — Z9181 History of falling: Secondary | ICD-10-CM | POA: Diagnosis not present

## 2022-09-13 DIAGNOSIS — Z7951 Long term (current) use of inhaled steroids: Secondary | ICD-10-CM | POA: Diagnosis not present

## 2022-09-13 DIAGNOSIS — Z7984 Long term (current) use of oral hypoglycemic drugs: Secondary | ICD-10-CM | POA: Diagnosis not present

## 2022-09-13 DIAGNOSIS — I2510 Atherosclerotic heart disease of native coronary artery without angina pectoris: Secondary | ICD-10-CM | POA: Diagnosis not present

## 2022-09-13 DIAGNOSIS — I10 Essential (primary) hypertension: Secondary | ICD-10-CM | POA: Diagnosis not present

## 2022-09-13 DIAGNOSIS — Z7901 Long term (current) use of anticoagulants: Secondary | ICD-10-CM | POA: Diagnosis not present

## 2022-09-13 DIAGNOSIS — Z794 Long term (current) use of insulin: Secondary | ICD-10-CM | POA: Diagnosis not present

## 2022-09-13 DIAGNOSIS — J4520 Mild intermittent asthma, uncomplicated: Secondary | ICD-10-CM | POA: Diagnosis not present

## 2022-09-13 DIAGNOSIS — E1169 Type 2 diabetes mellitus with other specified complication: Secondary | ICD-10-CM | POA: Diagnosis not present

## 2022-09-13 DIAGNOSIS — E039 Hypothyroidism, unspecified: Secondary | ICD-10-CM | POA: Diagnosis not present

## 2022-09-13 DIAGNOSIS — I700 Atherosclerosis of aorta: Secondary | ICD-10-CM | POA: Diagnosis not present

## 2022-09-13 DIAGNOSIS — I69354 Hemiplegia and hemiparesis following cerebral infarction affecting left non-dominant side: Secondary | ICD-10-CM | POA: Diagnosis not present

## 2022-09-13 DIAGNOSIS — Z951 Presence of aortocoronary bypass graft: Secondary | ICD-10-CM | POA: Diagnosis not present

## 2022-09-13 DIAGNOSIS — E785 Hyperlipidemia, unspecified: Secondary | ICD-10-CM | POA: Diagnosis not present

## 2022-09-16 ENCOUNTER — Telehealth: Payer: 59 | Admitting: *Deleted

## 2022-09-16 ENCOUNTER — Other Ambulatory Visit: Payer: Self-pay

## 2022-09-16 ENCOUNTER — Telehealth: Payer: Self-pay

## 2022-09-16 ENCOUNTER — Other Ambulatory Visit: Payer: Self-pay | Admitting: Family

## 2022-09-16 DIAGNOSIS — E1169 Type 2 diabetes mellitus with other specified complication: Secondary | ICD-10-CM | POA: Diagnosis not present

## 2022-09-16 DIAGNOSIS — E0590 Thyrotoxicosis, unspecified without thyrotoxic crisis or storm: Secondary | ICD-10-CM

## 2022-09-16 DIAGNOSIS — Z951 Presence of aortocoronary bypass graft: Secondary | ICD-10-CM | POA: Diagnosis not present

## 2022-09-16 DIAGNOSIS — I69354 Hemiplegia and hemiparesis following cerebral infarction affecting left non-dominant side: Secondary | ICD-10-CM | POA: Diagnosis not present

## 2022-09-16 DIAGNOSIS — Z7901 Long term (current) use of anticoagulants: Secondary | ICD-10-CM | POA: Diagnosis not present

## 2022-09-16 DIAGNOSIS — Z7984 Long term (current) use of oral hypoglycemic drugs: Secondary | ICD-10-CM | POA: Diagnosis not present

## 2022-09-16 DIAGNOSIS — E039 Hypothyroidism, unspecified: Secondary | ICD-10-CM | POA: Diagnosis not present

## 2022-09-16 DIAGNOSIS — Z7951 Long term (current) use of inhaled steroids: Secondary | ICD-10-CM | POA: Diagnosis not present

## 2022-09-16 DIAGNOSIS — I700 Atherosclerosis of aorta: Secondary | ICD-10-CM | POA: Diagnosis not present

## 2022-09-16 DIAGNOSIS — I10 Essential (primary) hypertension: Secondary | ICD-10-CM | POA: Diagnosis not present

## 2022-09-16 DIAGNOSIS — Z9181 History of falling: Secondary | ICD-10-CM | POA: Diagnosis not present

## 2022-09-16 DIAGNOSIS — Z87440 Personal history of urinary (tract) infections: Secondary | ICD-10-CM | POA: Diagnosis not present

## 2022-09-16 DIAGNOSIS — Z794 Long term (current) use of insulin: Secondary | ICD-10-CM | POA: Diagnosis not present

## 2022-09-16 DIAGNOSIS — J4520 Mild intermittent asthma, uncomplicated: Secondary | ICD-10-CM | POA: Diagnosis not present

## 2022-09-16 DIAGNOSIS — I2510 Atherosclerotic heart disease of native coronary artery without angina pectoris: Secondary | ICD-10-CM | POA: Diagnosis not present

## 2022-09-16 DIAGNOSIS — E785 Hyperlipidemia, unspecified: Secondary | ICD-10-CM | POA: Diagnosis not present

## 2022-09-16 MED ORDER — METHIMAZOLE 5 MG PO TABS
5.0000 mg | ORAL_TABLET | Freq: Every morning | ORAL | 1 refills | Status: DC
Start: 2022-09-16 — End: 2023-01-01

## 2022-09-16 MED ORDER — INSULIN LISPRO (1 UNIT DIAL) 100 UNIT/ML (KWIKPEN)
15.0000 [IU] | PEN_INJECTOR | Freq: Three times a day (TID) | SUBCUTANEOUS | 11 refills | Status: AC
Start: 2022-09-16 — End: ?

## 2022-09-16 NOTE — Progress Notes (Signed)
Novolog discontinued per patient's request not covered by insurance.Start on Humalog 15 units SQ three times daily with meals.

## 2022-09-16 NOTE — Telephone Encounter (Signed)
Lori [name] with [facility] called stating that she spoke with pharmacy about insulin that is covered by patient's insurance. She states that she was told that insurance will cover Humalog and needs prescription sent to [facility] pharmacy. Please send prescription to pharmacy.

## 2022-09-16 NOTE — Telephone Encounter (Signed)
Patient called and stated that her Novolog insulin is not covered by [employer] and she needs an alternative.   I called [facility] [phone] and spoke with Darl Pikes. She stated that Novolog is Covered by patient's insurance and it was just billed and sent out to the patient's home on 09/12/2022.  I called patient back and informed her and she stated that she is going to call [facility]

## 2022-09-17 ENCOUNTER — Other Ambulatory Visit: Payer: Self-pay

## 2022-09-17 DIAGNOSIS — E1169 Type 2 diabetes mellitus with other specified complication: Secondary | ICD-10-CM

## 2022-09-17 MED ORDER — INSULIN LISPRO (1 UNIT DIAL) 100 UNIT/ML (KWIKPEN): 15.0000 [IU] | PEN_INJECTOR | Freq: Three times a day (TID) | SUBCUTANEOUS | 11 refills | Status: DC

## 2022-09-19 ENCOUNTER — Other Ambulatory Visit: Payer: Self-pay | Admitting: Family

## 2022-09-19 DIAGNOSIS — E1169 Type 2 diabetes mellitus with other specified complication: Secondary | ICD-10-CM | POA: Diagnosis not present

## 2022-09-19 DIAGNOSIS — I69354 Hemiplegia and hemiparesis following cerebral infarction affecting left non-dominant side: Secondary | ICD-10-CM | POA: Diagnosis not present

## 2022-09-19 DIAGNOSIS — I10 Essential (primary) hypertension: Secondary | ICD-10-CM | POA: Diagnosis not present

## 2022-09-19 DIAGNOSIS — E1159 Type 2 diabetes mellitus with other circulatory complications: Secondary | ICD-10-CM

## 2022-09-19 DIAGNOSIS — I700 Atherosclerosis of aorta: Secondary | ICD-10-CM | POA: Diagnosis not present

## 2022-09-19 DIAGNOSIS — Z7951 Long term (current) use of inhaled steroids: Secondary | ICD-10-CM | POA: Diagnosis not present

## 2022-09-19 DIAGNOSIS — Z9181 History of falling: Secondary | ICD-10-CM | POA: Diagnosis not present

## 2022-09-19 DIAGNOSIS — I2510 Atherosclerotic heart disease of native coronary artery without angina pectoris: Secondary | ICD-10-CM | POA: Diagnosis not present

## 2022-09-19 DIAGNOSIS — J4520 Mild intermittent asthma, uncomplicated: Secondary | ICD-10-CM | POA: Diagnosis not present

## 2022-09-19 DIAGNOSIS — Z7901 Long term (current) use of anticoagulants: Secondary | ICD-10-CM | POA: Diagnosis not present

## 2022-09-19 DIAGNOSIS — Z87440 Personal history of urinary (tract) infections: Secondary | ICD-10-CM | POA: Diagnosis not present

## 2022-09-19 DIAGNOSIS — E785 Hyperlipidemia, unspecified: Secondary | ICD-10-CM | POA: Diagnosis not present

## 2022-09-19 DIAGNOSIS — Z951 Presence of aortocoronary bypass graft: Secondary | ICD-10-CM | POA: Diagnosis not present

## 2022-09-19 DIAGNOSIS — Z7984 Long term (current) use of oral hypoglycemic drugs: Secondary | ICD-10-CM | POA: Diagnosis not present

## 2022-09-19 DIAGNOSIS — E039 Hypothyroidism, unspecified: Secondary | ICD-10-CM | POA: Diagnosis not present

## 2022-09-19 DIAGNOSIS — Z794 Long term (current) use of insulin: Secondary | ICD-10-CM | POA: Diagnosis not present

## 2022-09-23 DIAGNOSIS — E785 Hyperlipidemia, unspecified: Secondary | ICD-10-CM | POA: Diagnosis not present

## 2022-09-23 DIAGNOSIS — I700 Atherosclerosis of aorta: Secondary | ICD-10-CM | POA: Diagnosis not present

## 2022-09-23 DIAGNOSIS — Z7951 Long term (current) use of inhaled steroids: Secondary | ICD-10-CM | POA: Diagnosis not present

## 2022-09-23 DIAGNOSIS — Z87440 Personal history of urinary (tract) infections: Secondary | ICD-10-CM | POA: Diagnosis not present

## 2022-09-23 DIAGNOSIS — Z7901 Long term (current) use of anticoagulants: Secondary | ICD-10-CM | POA: Diagnosis not present

## 2022-09-23 DIAGNOSIS — Z951 Presence of aortocoronary bypass graft: Secondary | ICD-10-CM | POA: Diagnosis not present

## 2022-09-23 DIAGNOSIS — Z794 Long term (current) use of insulin: Secondary | ICD-10-CM | POA: Diagnosis not present

## 2022-09-23 DIAGNOSIS — I69354 Hemiplegia and hemiparesis following cerebral infarction affecting left non-dominant side: Secondary | ICD-10-CM | POA: Diagnosis not present

## 2022-09-23 DIAGNOSIS — I10 Essential (primary) hypertension: Secondary | ICD-10-CM | POA: Diagnosis not present

## 2022-09-23 DIAGNOSIS — I2510 Atherosclerotic heart disease of native coronary artery without angina pectoris: Secondary | ICD-10-CM | POA: Diagnosis not present

## 2022-09-23 DIAGNOSIS — Z9181 History of falling: Secondary | ICD-10-CM | POA: Diagnosis not present

## 2022-09-23 DIAGNOSIS — J4520 Mild intermittent asthma, uncomplicated: Secondary | ICD-10-CM | POA: Diagnosis not present

## 2022-09-23 DIAGNOSIS — E039 Hypothyroidism, unspecified: Secondary | ICD-10-CM | POA: Diagnosis not present

## 2022-09-23 DIAGNOSIS — E1169 Type 2 diabetes mellitus with other specified complication: Secondary | ICD-10-CM | POA: Diagnosis not present

## 2022-09-23 DIAGNOSIS — Z7984 Long term (current) use of oral hypoglycemic drugs: Secondary | ICD-10-CM | POA: Diagnosis not present

## 2022-09-25 ENCOUNTER — Ambulatory Visit: Payer: Medicaid Other | Admitting: Family

## 2022-09-30 ENCOUNTER — Ambulatory Visit (INDEPENDENT_AMBULATORY_CARE_PROVIDER_SITE_OTHER): Payer: 59 | Admitting: Family

## 2022-09-30 ENCOUNTER — Encounter: Payer: Self-pay | Admitting: Family

## 2022-09-30 VITALS — BP 110/88 | HR 99 | Temp 97.7°F | Resp 18 | Ht 62.0 in | Wt 210.8 lb

## 2022-09-30 DIAGNOSIS — E1169 Type 2 diabetes mellitus with other specified complication: Secondary | ICD-10-CM | POA: Diagnosis not present

## 2022-09-30 DIAGNOSIS — E785 Hyperlipidemia, unspecified: Secondary | ICD-10-CM

## 2022-09-30 DIAGNOSIS — R051 Acute cough: Secondary | ICD-10-CM | POA: Diagnosis not present

## 2022-10-02 ENCOUNTER — Telehealth: Payer: Self-pay

## 2022-10-02 MED ORDER — SEMAGLUTIDE(0.25 OR 0.5MG/DOS) 2 MG/3ML ~~LOC~~ SOPN: 0.5000 mg | PEN_INJECTOR | SUBCUTANEOUS | 0 refills | Status: DC

## 2022-10-02 NOTE — Telephone Encounter (Signed)
Patient called asking if prescription for ozempic had been sent to pharmacy. Please send prescription to [facility] pharmacy.  Message sent to Richarda Blade, NP

## 2022-10-02 NOTE — Progress Notes (Signed)
Provider: Richarda Blade FNP-C  Leslee Haueter, Donalee Citrin, NP  Patient Care Team: Maelin Kurkowski, Donalee Citrin, NP as PCP - General (Family Medicine) Orbie Pyo, MD as PCP - Cardiology (Cardiology) [facility]  Extended Emergency Contact Information Primary Emergency Contact: [name],[name] Mobile Phone: [phone] Relation: Spouse Preferred language: English Interpreter needed? No Secondary Emergency Contact: [name],[name] Home Phone: [phone] Mobile Phone: [phone] Relation: Daughter Mother: Blenda Peals Phone: [phone]  Code Status:  Full Code  Goals of care: Advanced Directive information    09/30/2022    2:17 PM  Advanced Directives  Does Patient Have a Medical Advance Directive? No  Would patient like information on creating a medical advance directive? No - Patient declined     Chief Complaint  Patient presents with   Follow-up     Coughing up green mucous     HPI:  Pt is a 52 y.o. female seen today for an acute visit for evaluation of cough x 3 weeks. She is here with her husband.Describes cough as productive coughing up greenish mucus. Sometimes has shortness of breath at rest.State woke up previous night could not breathe due to cough. Had COVID-19 test 3 days ago at [facility] urgent care but test was negative.She was treated with albuterol.No records for review on EPIC last visit was 09/06/2022 with asthma exacerbation at [facility] ED at [facility] was prescribed Z-pak and Prednisone. She denies any fever,chills,fatigue,body aches,runny nose,chest tightness,chest pain or palpitation.   Past Medical History:  Diagnosis Date   CAD (coronary artery disease)    Depression    Diabetes mellitus without complication (HCC)    History of CT scan    History of mammogram    History of MRI    Hypertension    Pheochromocytoma    Pheochromocytoma    Stroke (HCC)    Thyroid disease    Past Surgical History:  Procedure  Laterality Date   ABDOMINAL HYSTERECTOMY     CESAREAN SECTION     3   CORONARY ARTERY BYPASS GRAFT     Right adrenal gland removal for pheochromocytoma Right     Allergies  Allergen Reactions   Contrast Media [Iodinated Contrast Media] Anaphylaxis and Swelling   Penicillins Swelling    Mouth swells up and eyes swollen shut   Sumatriptan Anaphylaxis   Vancomycin Other (See Comments)    Red Man's Syndrome   Aspirin Swelling   Ciprofloxacin     Feel nausea/vomitting   Morphine Swelling    Outpatient Encounter Medications as of 09/30/2022  Medication Sig   albuterol (PROVENTIL) (2.5 MG/3ML) 0.083% nebulizer solution Take 3 mLs (2.5 mg total) by nebulization every 6 (six) hours as needed for wheezing or shortness of breath.   albuterol (VENTOLIN HFA) 108 (90 Base) MCG/ACT inhaler INHALE 2 PUFFS BY MOUTH EVERY 6 HOURS AS NEEDED FOR WHEEZING AND FOR SHORTNESS OF BREATH   amLODipine (NORVASC) 5 MG tablet Take 1 tablet (5 mg total) by mouth daily.   atorvastatin (LIPITOR) 80 MG tablet Take 1 tablet (80 mg total) by mouth daily.   azithromycin (ZITHROMAX) 250 MG tablet Take 1 tablet (250 mg total) by mouth daily. Take first 2 tablets together, then 1 every day until finished.   baclofen (LIORESAL) 10 MG tablet Take 1 tablet (10 mg total) by mouth 3 (three) times daily. X 1 week, then 10 mg TID-for spasticity- can increase  the dose if you call me to discuss   blood glucose meter kit and  supplies 1 each by Other route as directed. Dispense based on patient and insurance preference. Use up to four times daily as directed. (FOR ICD-10 E10.9, E11.9).   budesonide (PULMICORT) 0.25 MG/2ML nebulizer solution Take 2 mLs (0.25 mg total) by nebulization 2 (two) times daily.   clopidogrel (PLAVIX) 75 MG tablet Take 1 tablet (75 mg total) by mouth daily.   Continuous Glucose Sensor (FREESTYLE LIBRE 14 DAY SENSOR) MISC 1 each by Does not apply route as needed. DX: E11.69   cyclobenzaprine (FLEXERIL) 5 MG  tablet Take 1 tablet (5 mg total) by mouth 3 (three) times daily as needed for muscle spasms.   diclofenac Sodium (VOLTAREN) 1 % GEL Apply 4 g topically 4 (four) times daily.   escitalopram (LEXAPRO) 20 MG tablet Take 1 tablet (20 mg total) by mouth every morning.   ezetimibe (ZETIA) 10 MG tablet Take 1 tablet (10 mg total) by mouth daily.   gabapentin (NEURONTIN) 100 MG capsule Take 1 capsule (100 mg total) by mouth 3 (three) times daily.   guaiFENesin (MUCINEX) 600 MG 12 hr tablet Take 1 tablet (600 mg total) by mouth 2 (two) times daily.   HYDROcodone-acetaminophen (NORCO/VICODIN) 5-325 MG tablet Take 1 tablet by mouth every 6 (six) hours as needed for moderate pain.   insulin lispro (HUMALOG) 100 UNIT/ML KwikPen Inject 15 Units into the skin 3 (three) times daily with meals. For blood sugars > 150   Insulin Pen Needle 32G X 4 MM MISC Use to inject insulin 4 times daily as directed.   KLAYESTA powder Apply 1 Application topically 2 (two) times daily. Affected areas on breast fold, groin, and sacral areas.   LANTUS SOLOSTAR 100 UNIT/ML Solostar Pen INJECT 40 UNITS SUBCUTANEOUSLY AT BEDTIME   LORazepam (ATIVAN) 0.5 MG tablet Take 1 tablet (0.5 mg total) by mouth at bedtime.   methimazole (TAPAZOLE) 5 MG tablet Take 1 tablet (5 mg total) by mouth every morning.   metoprolol tartrate (LOPRESSOR) 100 MG tablet TAKE 1 TABLET BY MOUTH TWICE DAILY (BREAKFAST, BEDTIME)   nystatin cream (MYCOSTATIN) Apply 1 Application topically 2 (two) times daily. Affected areas on breast fold and groin and sacral areas   olmesartan-hydrochlorothiazide (BENICAR HCT) 40-25 MG tablet Take 1 tablet by mouth daily.   omeprazole (PRILOSEC) 20 MG capsule Take 1 capsule (20 mg total) by mouth daily.   polyethylene glycol powder (GLYCOLAX/MIRALAX) 17 GM/SCOOP powder Take 17 g by mouth daily. Hold for loose stool   predniSONE (DELTASONE) 20 MG tablet Take 2 tablets (40 mg total) by mouth daily.   rivaroxaban (XARELTO) 10 MG  TABS tablet Take 1 tablet (10 mg total) by mouth daily.   tamsulosin (FLOMAX) 0.4 MG CAPS capsule Take 1 capsule (0.4 mg total) by mouth at bedtime.   topiramate (TOPAMAX) 25 MG tablet Take 1 tablet (25 mg total) by mouth 2 (two) times daily.   triamcinolone ointment (KENALOG) 0.5 % Apply 1 Application topically 2 (two) times daily.   zinc oxide (BALMEX) 11.3 % CREA cream Apply 1 Application topically 2 (two) times daily.   No facility-administered encounter medications on file as of 09/30/2022.    Review of Systems  Constitutional:  Negative for appetite change, chills, fatigue, fever and unexpected weight change.  HENT:  Negative for congestion, ear discharge, ear pain, hearing loss, nosebleeds, rhinorrhea, sinus pressure, sinus pain, sneezing, sore throat and tinnitus.   Eyes:  Negative for pain, discharge, redness, itching and visual disturbance.  Respiratory:  Positive for cough. Negative for  chest tightness, shortness of breath and wheezing.   Cardiovascular:  Negative for chest pain, palpitations and leg swelling.  Gastrointestinal:  Negative for abdominal distention, abdominal pain, constipation, diarrhea, nausea and vomiting.  Musculoskeletal:  Positive for gait problem. Negative for arthralgias, back pain, joint swelling, myalgias, neck pain and neck stiffness.  Skin:  Negative for color change, pallor and rash.  Neurological:  Negative for dizziness, light-headedness and headaches.     There is no immunization history on file for this patient. Pertinent  Health Maintenance Due  Topic Date Due   FOOT EXAM  Never done   OPHTHALMOLOGY EXAM  Never done   Colonoscopy  Never done   INFLUENZA VACCINE  09/05/2022   HEMOGLOBIN A1C  02/27/2023   PAP SMEAR-Modifier  Discontinued      03/29/2022   12:55 PM 04/08/2022   12:50 PM 05/29/2022   11:04 AM 09/10/2022   12:28 PM 09/30/2022    2:16 PM  Fall Risk  Falls in the past year? 0 1 0 0 0  Was there an injury with Fall? 0 0 0 0 0   Fall Risk Category Calculator 0 2 0 0 0  Patient at Risk for Falls Due to   History of fall(s)  No Fall Risks  Fall risk Follow up   Falls evaluation completed  Falls evaluation completed   Functional Status Survey:    Vitals:   09/30/22 1417  BP: 110/88  Pulse: 99  Resp: 18  Temp: 97.7 F (36.5 C)  SpO2: 95%  Weight: 210 lb 12.8 oz (95.6 kg)  Height: 5\' 2"  (1.575 m)   Body mass index is 38.56 kg/m. Physical Exam Vitals reviewed.  Constitutional:      General: She is not in acute distress.    Appearance: Normal appearance. She is normal weight. She is not ill-appearing or diaphoretic.  HENT:     Head: Normocephalic.     Right Ear: Tympanic membrane, ear canal and external ear normal. There is no impacted cerumen.     Left Ear: Tympanic membrane, ear canal and external ear normal. There is no impacted cerumen.     Nose: Nose normal. No congestion or rhinorrhea.     Mouth/Throat:     Mouth: Mucous membranes are moist.     Pharynx: Oropharynx is clear. No oropharyngeal exudate or posterior oropharyngeal erythema.  Eyes:     General: No scleral icterus.       Right eye: No discharge.        Left eye: No discharge.     Extraocular Movements: Extraocular movements intact.     Conjunctiva/sclera: Conjunctivae normal.     Pupils: Pupils are equal, round, and reactive to light.  Neck:     Vascular: No carotid bruit.  Cardiovascular:     Rate and Rhythm: Normal rate and regular rhythm.     Pulses: Normal pulses.     Heart sounds: Normal heart sounds. No murmur heard.    No friction rub. No gallop.  Pulmonary:     Effort: Pulmonary effort is normal. No respiratory distress.     Breath sounds: Examination of the left-middle field reveals decreased breath sounds. Examination of the left-lower field reveals decreased breath sounds. Decreased breath sounds present. No wheezing, rhonchi or rales.  Chest:     Chest wall: No tenderness.  Abdominal:     General: Bowel sounds are  normal. There is no distension.     Palpations: Abdomen is soft. There is no  mass.     Tenderness: There is no abdominal tenderness. There is no right CVA tenderness, left CVA tenderness, guarding or rebound.  Musculoskeletal:        General: No swelling or tenderness. Normal range of motion.     Cervical back: Normal range of motion. No rigidity or tenderness.     Right lower leg: No edema.     Left lower leg: No edema.  Lymphadenopathy:     Cervical: No cervical adenopathy.  Skin:    General: Skin is warm and dry.     Coloration: Skin is not pale.     Findings: No erythema or rash.  Neurological:     Mental Status: She is alert and oriented to person, place, and time.     Cranial Nerves: No cranial nerve deficit.     Gait: Gait abnormal.     Comments: Left hand contracture   Psychiatric:        Mood and Affect: Mood normal.        Speech: Speech normal.        Behavior: Behavior normal.     Labs reviewed: Recent Labs    11/03/21 0811 11/04/21 0317 11/05/21 0259 11/06/21 0307 07/08/22 0214 07/10/22 0142 09/06/22 2242  NA  --  137 138   < > 141 136 134*  K  --  3.9 3.3*   < > 4.0 4.3 3.5  CL  --  112* 112*   < > 110 104 101  CO2  --  18* 20*   < > 22 20* 18*  GLUCOSE  --  233* 113*   < > 86 183* 366*  BUN  --  18 20   < > 18 22* 28*  CREATININE  --  0.64 0.69   < > 0.98 1.13* 1.38*  CALCIUM  --  8.0* 8.3*   < > 8.1* 8.5* 8.6*  MG 1.7 2.2 2.1  --   --   --   --    < > = values in this interval not displayed.   Recent Labs    05/01/22 0200 05/22/22 0229 07/04/22 1954  AST 11* 15 15  ALT 13 15 17   ALKPHOS 89 90 117  BILITOT 0.8 0.4 0.6  PROT 6.6 7.0 8.1  ALBUMIN 3.2* 3.3* 3.7   Recent Labs    05/22/22 0229 07/04/22 2122 07/05/22 0121 07/08/22 0214 07/10/22 0142 09/06/22 2242  WBC 8.8 8.6   < > 7.7 8.6 9.3  NEUTROABS 4.2 5.1  --   --   --  4.5  HGB 12.8 11.8*   < > 10.2* 11.3* 11.3*  HCT 38.1 36.1   < > 32.0* 35.0* 35.4*  MCV 77.6* 80.9   < > 81.0  81.0 78.0*  PLT 346 361   < > 319 398 318   < > = values in this interval not displayed.   Lab Results  Component Value Date   TSH 1.865 04/30/2022   Lab Results  Component Value Date   HGBA1C 9.8 (H) 08/27/2022   Lab Results  Component Value Date   CHOL 148 03/01/2022   HDL 44 03/01/2022   LDLCALC 79 03/01/2022   TRIG 143 03/01/2022   CHOLHDL 3.4 03/01/2022    Significant Diagnostic Results in last 30 days:  DG Chest Port 1 View  Result Date: 09/06/2022 CLINICAL DATA:  Cough and shortness of breath.  Wheezing. EXAM: PORTABLE CHEST 1 VIEW COMPARISON:  07/04/2022 FINDINGS: Prior median sternotomy  and CABG. Stable heart size and mediastinal contours. Incidental azygous fissure. Mild peribronchial thickening without focal airspace disease. No pleural fluid or pneumothorax. No pulmonary edema. No acute osseous abnormalities. IMPRESSION: Mild peribronchial thickening, can be seen with bronchitis or asthma. Electronically Signed   By: Narda Rutherford M.D.   On: 09/06/2022 23:01    Assessment/Plan  1. Acute cough Afebrile  Left mid -lower lobe lung diminished breath sounds though could be limited patient not taking deep breaths.No wheezes noted. - encouraged deep breathing exercises - continue with mucinex for Cough  - start on doxycycline 100 mg tablet twice daily x 7 days   2. Type 2 diabetes mellitus with hyperlipidemia (HCC) Lab Results  Component Value Date   HGBA1C 9.8 (H) 08/27/2022  CBG still in the 200's at home though unable to scan her continuous monitor device during visit states due to be changed.encourage to change device as as prescribed. - discussed starting on Ozempic to help lower Blood sugars. - Ozempic 0.25 mg injection SQ sample x 1 month given - follow up in one month to evaluate blood sugars  - will continue on lantus and Humalog then reduce on upcoming visit if CBG trending down.  -Advised to notify provider for any blood sugar counts less than 75 or  greater than 200  Family/ staff Communication: Reviewed plan of care with patient and husband verbalized understanding  Labs/tests ordered: None   Next Appointment: Return in about 1 month (around 10/31/2022).   Caesar Bookman, NP

## 2022-10-02 NOTE — Telephone Encounter (Signed)
Ozempic 0.25 mg inject 0.5 mg sol once you have completed the 0.25 mg inject sample given during visit in one month.

## 2022-10-06 ENCOUNTER — Encounter (HOSPITAL_COMMUNITY): Payer: Self-pay

## 2022-10-06 ENCOUNTER — Other Ambulatory Visit: Payer: Self-pay

## 2022-10-06 ENCOUNTER — Emergency Department (HOSPITAL_COMMUNITY): Payer: 59

## 2022-10-06 ENCOUNTER — Inpatient Hospital Stay (HOSPITAL_COMMUNITY)
Admission: EM | Admit: 2022-10-06 | Discharge: 2022-10-09 | DRG: 303 | Disposition: A | Discharge status: Home / Self Care | Payer: 59 | Attending: Internal Medicine | Admitting: Internal Medicine

## 2022-10-06 DIAGNOSIS — Z794 Long term (current) use of insulin: Secondary | ICD-10-CM | POA: Diagnosis not present

## 2022-10-06 DIAGNOSIS — Z881 Allergy status to other antibiotic agents status: Secondary | ICD-10-CM

## 2022-10-06 DIAGNOSIS — E785 Hyperlipidemia, unspecified: Secondary | ICD-10-CM | POA: Diagnosis not present

## 2022-10-06 DIAGNOSIS — K219 Gastro-esophageal reflux disease without esophagitis: Secondary | ICD-10-CM | POA: Diagnosis present

## 2022-10-06 DIAGNOSIS — Z5329 Procedure and treatment not carried out because of patient's decision for other reasons: Secondary | ICD-10-CM | POA: Diagnosis not present

## 2022-10-06 DIAGNOSIS — Z79899 Other long term (current) drug therapy: Secondary | ICD-10-CM

## 2022-10-06 DIAGNOSIS — Z7902 Long term (current) use of antithrombotics/antiplatelets: Secondary | ICD-10-CM

## 2022-10-06 DIAGNOSIS — R519 Headache, unspecified: Secondary | ICD-10-CM | POA: Diagnosis not present

## 2022-10-06 DIAGNOSIS — Z8673 Personal history of transient ischemic attack (TIA), and cerebral infarction without residual deficits: Secondary | ICD-10-CM | POA: Diagnosis not present

## 2022-10-06 DIAGNOSIS — Z832 Family history of diseases of the blood and blood-forming organs and certain disorders involving the immune mechanism: Secondary | ICD-10-CM

## 2022-10-06 DIAGNOSIS — E039 Hypothyroidism, unspecified: Secondary | ICD-10-CM | POA: Diagnosis not present

## 2022-10-06 DIAGNOSIS — Z87891 Personal history of nicotine dependence: Secondary | ICD-10-CM

## 2022-10-06 DIAGNOSIS — T782XXA Anaphylactic shock, unspecified, initial encounter: Secondary | ICD-10-CM | POA: Diagnosis not present

## 2022-10-06 DIAGNOSIS — I959 Hypotension, unspecified: Secondary | ICD-10-CM | POA: Diagnosis not present

## 2022-10-06 DIAGNOSIS — R944 Abnormal results of kidney function studies: Secondary | ICD-10-CM | POA: Diagnosis not present

## 2022-10-06 DIAGNOSIS — Z885 Allergy status to narcotic agent status: Secondary | ICD-10-CM

## 2022-10-06 DIAGNOSIS — R0789 Other chest pain: Secondary | ICD-10-CM | POA: Diagnosis not present

## 2022-10-06 DIAGNOSIS — F32A Depression, unspecified: Secondary | ICD-10-CM | POA: Diagnosis present

## 2022-10-06 DIAGNOSIS — Z833 Family history of diabetes mellitus: Secondary | ICD-10-CM

## 2022-10-06 DIAGNOSIS — Z886 Allergy status to analgesic agent status: Secondary | ICD-10-CM

## 2022-10-06 DIAGNOSIS — I499 Cardiac arrhythmia, unspecified: Secondary | ICD-10-CM | POA: Diagnosis not present

## 2022-10-06 DIAGNOSIS — I10 Essential (primary) hypertension: Secondary | ICD-10-CM | POA: Diagnosis not present

## 2022-10-06 DIAGNOSIS — I5032 Chronic diastolic (congestive) heart failure: Secondary | ICD-10-CM | POA: Diagnosis present

## 2022-10-06 DIAGNOSIS — E669 Obesity, unspecified: Secondary | ICD-10-CM | POA: Diagnosis present

## 2022-10-06 DIAGNOSIS — Z8679 Personal history of other diseases of the circulatory system: Secondary | ICD-10-CM | POA: Diagnosis not present

## 2022-10-06 DIAGNOSIS — Z951 Presence of aortocoronary bypass graft: Secondary | ICD-10-CM

## 2022-10-06 DIAGNOSIS — Z743 Need for continuous supervision: Secondary | ICD-10-CM | POA: Diagnosis not present

## 2022-10-06 DIAGNOSIS — J45909 Unspecified asthma, uncomplicated: Secondary | ICD-10-CM | POA: Insufficient documentation

## 2022-10-06 DIAGNOSIS — L299 Pruritus, unspecified: Secondary | ICD-10-CM | POA: Diagnosis not present

## 2022-10-06 DIAGNOSIS — Z8249 Family history of ischemic heart disease and other diseases of the circulatory system: Secondary | ICD-10-CM

## 2022-10-06 DIAGNOSIS — I25119 Atherosclerotic heart disease of native coronary artery with unspecified angina pectoris: Secondary | ICD-10-CM | POA: Diagnosis not present

## 2022-10-06 DIAGNOSIS — E119 Type 2 diabetes mellitus without complications: Secondary | ICD-10-CM

## 2022-10-06 DIAGNOSIS — R072 Precordial pain: Secondary | ICD-10-CM | POA: Diagnosis not present

## 2022-10-06 DIAGNOSIS — R6889 Other general symptoms and signs: Secondary | ICD-10-CM | POA: Diagnosis not present

## 2022-10-06 DIAGNOSIS — I69354 Hemiplegia and hemiparesis following cerebral infarction affecting left non-dominant side: Secondary | ICD-10-CM

## 2022-10-06 DIAGNOSIS — Z1152 Encounter for screening for COVID-19: Secondary | ICD-10-CM | POA: Diagnosis not present

## 2022-10-06 DIAGNOSIS — Z888 Allergy status to other drugs, medicaments and biological substances status: Secondary | ICD-10-CM

## 2022-10-06 DIAGNOSIS — E1165 Type 2 diabetes mellitus with hyperglycemia: Secondary | ICD-10-CM | POA: Diagnosis not present

## 2022-10-06 DIAGNOSIS — I361 Nonrheumatic tricuspid (valve) insufficiency: Secondary | ICD-10-CM | POA: Diagnosis not present

## 2022-10-06 DIAGNOSIS — R000 Tachycardia, unspecified: Secondary | ICD-10-CM | POA: Diagnosis present

## 2022-10-06 DIAGNOSIS — F411 Generalized anxiety disorder: Secondary | ICD-10-CM | POA: Diagnosis not present

## 2022-10-06 DIAGNOSIS — Z7951 Long term (current) use of inhaled steroids: Secondary | ICD-10-CM

## 2022-10-06 DIAGNOSIS — Z7901 Long term (current) use of anticoagulants: Secondary | ICD-10-CM

## 2022-10-06 DIAGNOSIS — R079 Chest pain, unspecified: Secondary | ICD-10-CM | POA: Insufficient documentation

## 2022-10-06 DIAGNOSIS — I110 Hypertensive heart disease with heart failure: Secondary | ICD-10-CM | POA: Diagnosis not present

## 2022-10-06 DIAGNOSIS — Z825 Family history of asthma and other chronic lower respiratory diseases: Secondary | ICD-10-CM

## 2022-10-06 DIAGNOSIS — Z7985 Long-term (current) use of injectable non-insulin antidiabetic drugs: Secondary | ICD-10-CM

## 2022-10-06 DIAGNOSIS — Z90710 Acquired absence of both cervix and uterus: Secondary | ICD-10-CM

## 2022-10-06 DIAGNOSIS — Z91041 Radiographic dye allergy status: Secondary | ICD-10-CM

## 2022-10-06 DIAGNOSIS — Z808 Family history of malignant neoplasm of other organs or systems: Secondary | ICD-10-CM

## 2022-10-06 DIAGNOSIS — Z880 Allergy status to penicillin: Secondary | ICD-10-CM

## 2022-10-06 DIAGNOSIS — I200 Unstable angina: Secondary | ICD-10-CM | POA: Diagnosis not present

## 2022-10-06 DIAGNOSIS — E1169 Type 2 diabetes mellitus with other specified complication: Secondary | ICD-10-CM

## 2022-10-06 DIAGNOSIS — I639 Cerebral infarction, unspecified: Secondary | ICD-10-CM | POA: Diagnosis not present

## 2022-10-06 DIAGNOSIS — Z6838 Body mass index (BMI) 38.0-38.9, adult: Secondary | ICD-10-CM

## 2022-10-06 HISTORY — DX: Chest pain, unspecified: R07.9

## 2022-10-06 LAB — CBC
HCT: 38.3 % (ref 36.0–46.0)
Hemoglobin: 12.2 g/dL (ref 12.0–15.0)
MCH: 25.1 pg — ABNORMAL LOW (ref 26.0–34.0)
MCHC: 31.9 g/dL (ref 30.0–36.0)
MCV: 78.6 fL — ABNORMAL LOW (ref 80.0–100.0)
Platelets: 373 10*3/uL (ref 150–400)
RBC: 4.87 MIL/uL (ref 3.87–5.11)
RDW: 16.9 % — ABNORMAL HIGH (ref 11.5–15.5)
WBC: 8.7 10*3/uL (ref 4.0–10.5)
nRBC: 0 % (ref 0.0–0.2)

## 2022-10-06 LAB — RESP PANEL BY RT-PCR (RSV, FLU A&B, COVID)  RVPGX2
Influenza A by PCR: NEGATIVE
Influenza B by PCR: NEGATIVE
Resp Syncytial Virus by PCR: NEGATIVE
SARS Coronavirus 2 by RT PCR: NEGATIVE

## 2022-10-06 LAB — CBG MONITORING, ED: Glucose-Capillary: 188 mg/dL — ABNORMAL HIGH (ref 70–99)

## 2022-10-06 LAB — BASIC METABOLIC PANEL
Anion gap: 11 (ref 5–15)
BUN: 21 mg/dL — ABNORMAL HIGH (ref 6–20)
CO2: 24 mmol/L (ref 22–32)
Calcium: 9.1 mg/dL (ref 8.9–10.3)
Chloride: 101 mmol/L (ref 98–111)
Creatinine, Ser: 0.92 mg/dL (ref 0.44–1.00)
GFR, Estimated: >60 mL/min (ref >60)
Glucose, Bld: 256 mg/dL — ABNORMAL HIGH (ref 70–99)
Potassium: 4 mmol/L (ref 3.5–5.1)
Sodium: 136 mmol/L (ref 135–145)

## 2022-10-06 LAB — D-DIMER, QUANTITATIVE: D-Dimer, Quant: <0.27 ug{FEU}/mL (ref 0.00–0.50)

## 2022-10-06 LAB — TROPONIN I (HIGH SENSITIVITY)
Troponin I (High Sensitivity): 5 ng/L (ref <18)
Troponin I (High Sensitivity): 6 ng/L (ref <18)

## 2022-10-06 LAB — TSH: TSH: 0.816 u[IU]/mL (ref 0.350–4.500)

## 2022-10-06 MED ORDER — ONDANSETRON HCL 4 MG/2ML IJ SOLN: 4.0000 mg | Freq: Four times a day (QID) | INTRAMUSCULAR | Status: DC | PRN

## 2022-10-06 MED ORDER — ATORVASTATIN CALCIUM 80 MG PO TABS
80.0000 mg | ORAL_TABLET | Freq: Every day | ORAL | Status: DC
  Administered 2022-10-07 – 2022-10-09 (×4): 80 mg via ORAL
  Filled 2022-10-06 (×3): qty 1
  Filled 2022-10-06: qty 2

## 2022-10-06 MED ORDER — POLYETHYLENE GLYCOL 3350 17 G PO PACK
17.0000 g | PACK | Freq: Every day | ORAL | Status: DC
  Filled 2022-10-06: qty 1

## 2022-10-06 MED ORDER — CYCLOBENZAPRINE HCL 10 MG PO TABS
5.0000 mg | ORAL_TABLET | Freq: Three times a day (TID) | ORAL | Status: DC | PRN
  Administered 2022-10-07 – 2022-10-08 (×2): 5 mg via ORAL
  Filled 2022-10-06 (×3): qty 1

## 2022-10-06 MED ORDER — SODIUM CHLORIDE 0.9% FLUSH
3.0000 mL | Freq: Two times a day (BID) | INTRAVENOUS | Status: DC
  Administered 2022-10-07 – 2022-10-09 (×4): 3 mL via INTRAVENOUS

## 2022-10-06 MED ORDER — METOPROLOL TARTRATE 100 MG PO TABS
100.0000 mg | ORAL_TABLET | Freq: Two times a day (BID) | ORAL | Status: DC
  Administered 2022-10-07 (×2): 100 mg via ORAL
  Filled 2022-10-06 (×2): qty 1

## 2022-10-06 MED ORDER — LACTATED RINGERS IV BOLUS
1000.0000 mL | Freq: Once | INTRAVENOUS | Status: AC
  Administered 2022-10-06: 1000 mL via INTRAVENOUS

## 2022-10-06 MED ORDER — GABAPENTIN 100 MG PO CAPS
100.0000 mg | ORAL_CAPSULE | Freq: Three times a day (TID) | ORAL | Status: DC
  Administered 2022-10-07 – 2022-10-09 (×8): 100 mg via ORAL
  Filled 2022-10-06 (×8): qty 1

## 2022-10-06 MED ORDER — PANTOPRAZOLE SODIUM 40 MG PO TBEC
40.0000 mg | DELAYED_RELEASE_TABLET | Freq: Every day | ORAL | Status: DC
  Administered 2022-10-07 – 2022-10-09 (×3): 40 mg via ORAL
  Filled 2022-10-06 (×3): qty 1

## 2022-10-06 MED ORDER — SODIUM CHLORIDE 0.9 % IV SOLN: 250.0000 mL | INTRAVENOUS | Status: DC | PRN

## 2022-10-06 MED ORDER — RIVAROXABAN 10 MG PO TABS
10.0000 mg | ORAL_TABLET | Freq: Every day | ORAL | Status: DC
  Administered 2022-10-07 – 2022-10-08 (×2): 10 mg via ORAL
  Filled 2022-10-06 (×2): qty 1

## 2022-10-06 MED ORDER — ESCITALOPRAM OXALATE 10 MG PO TABS
20.0000 mg | ORAL_TABLET | Freq: Every morning | ORAL | Status: DC
  Administered 2022-10-07 – 2022-10-09 (×3): 20 mg via ORAL
  Filled 2022-10-06 (×3): qty 2

## 2022-10-06 MED ORDER — ACETAMINOPHEN 500 MG PO TABS
1000.0000 mg | ORAL_TABLET | Freq: Once | ORAL | Status: AC
  Administered 2022-10-06: 1000 mg via ORAL
  Filled 2022-10-06: qty 2

## 2022-10-06 MED ORDER — ALBUTEROL SULFATE (2.5 MG/3ML) 0.083% IN NEBU: 2.5000 mg | INHALATION_SOLUTION | Freq: Four times a day (QID) | RESPIRATORY_TRACT | Status: DC | PRN

## 2022-10-06 MED ORDER — BACLOFEN 10 MG PO TABS
10.0000 mg | ORAL_TABLET | Freq: Three times a day (TID) | ORAL | Status: DC
  Administered 2022-10-07 – 2022-10-09 (×8): 10 mg via ORAL
  Filled 2022-10-06 (×8): qty 1

## 2022-10-06 MED ORDER — ENOXAPARIN SODIUM 40 MG/0.4ML IJ SOSY: 40.0000 mg | PREFILLED_SYRINGE | INTRAMUSCULAR | Status: DC

## 2022-10-06 MED ORDER — TOPIRAMATE 25 MG PO TABS
25.0000 mg | ORAL_TABLET | Freq: Two times a day (BID) | ORAL | Status: DC
  Administered 2022-10-07 – 2022-10-09 (×6): 25 mg via ORAL
  Filled 2022-10-06 (×7): qty 1

## 2022-10-06 MED ORDER — INSULIN GLARGINE-YFGN 100 UNIT/ML ~~LOC~~ SOLN
40.0000 [IU] | Freq: Every day | SUBCUTANEOUS | Status: DC
  Administered 2022-10-07 – 2022-10-08 (×3): 40 [IU] via SUBCUTANEOUS
  Filled 2022-10-06 (×4): qty 0.4

## 2022-10-06 MED ORDER — AMLODIPINE BESYLATE 5 MG PO TABS
5.0000 mg | ORAL_TABLET | Freq: Every day | ORAL | Status: DC
  Administered 2022-10-07: 5 mg via ORAL
  Filled 2022-10-06: qty 1

## 2022-10-06 MED ORDER — METOPROLOL TARTRATE 25 MG PO TABS: 100.0000 mg | ORAL_TABLET | Freq: Two times a day (BID) | ORAL | Status: DC

## 2022-10-06 MED ORDER — NITROGLYCERIN 0.4 MG SL SUBL: 0.4000 mg | SUBLINGUAL_TABLET | SUBLINGUAL | Status: DC | PRN

## 2022-10-06 MED ORDER — ACETAMINOPHEN 325 MG PO TABS
650.0000 mg | ORAL_TABLET | Freq: Four times a day (QID) | ORAL | Status: DC | PRN
  Filled 2022-10-06: qty 2

## 2022-10-06 MED ORDER — ONDANSETRON HCL 4 MG PO TABS: 4.0000 mg | ORAL_TABLET | Freq: Four times a day (QID) | ORAL | Status: DC | PRN

## 2022-10-06 MED ORDER — SODIUM CHLORIDE 0.9% FLUSH: 3.0000 mL | INTRAVENOUS | Status: DC | PRN

## 2022-10-06 MED ORDER — CLOPIDOGREL BISULFATE 75 MG PO TABS
75.0000 mg | ORAL_TABLET | Freq: Every day | ORAL | Status: DC
  Administered 2022-10-07 – 2022-10-09 (×3): 75 mg via ORAL
  Filled 2022-10-06 (×3): qty 1

## 2022-10-06 MED ORDER — LORAZEPAM 0.5 MG PO TABS
0.5000 mg | ORAL_TABLET | Freq: Every day | ORAL | Status: DC
  Administered 2022-10-07 – 2022-10-08 (×3): 0.5 mg via ORAL
  Filled 2022-10-06 (×3): qty 1

## 2022-10-06 MED ORDER — EZETIMIBE 10 MG PO TABS
10.0000 mg | ORAL_TABLET | Freq: Every day | ORAL | Status: DC
  Administered 2022-10-07 – 2022-10-09 (×3): 10 mg via ORAL
  Filled 2022-10-06 (×3): qty 1

## 2022-10-06 MED ORDER — INSULIN ASPART 100 UNIT/ML IJ SOLN
0.0000 [IU] | Freq: Three times a day (TID) | INTRAMUSCULAR | Status: DC
  Administered 2022-10-07: 1 [IU] via SUBCUTANEOUS
  Administered 2022-10-07: 2 [IU] via SUBCUTANEOUS
  Administered 2022-10-09: 4 [IU] via SUBCUTANEOUS

## 2022-10-06 MED ORDER — ACETAMINOPHEN 650 MG RE SUPP: 650.0000 mg | Freq: Four times a day (QID) | RECTAL | Status: DC | PRN

## 2022-10-06 MED ORDER — INSULIN LISPRO (1 UNIT DIAL) 100 UNIT/ML (KWIKPEN): 15.0000 [IU] | PEN_INJECTOR | Freq: Three times a day (TID) | SUBCUTANEOUS | Status: DC

## 2022-10-06 MED ORDER — BUDESONIDE 0.25 MG/2ML IN SUSP
0.2500 mg | Freq: Two times a day (BID) | RESPIRATORY_TRACT | Status: DC
  Administered 2022-10-07 – 2022-10-09 (×5): 0.25 mg via RESPIRATORY_TRACT
  Filled 2022-10-06 (×6): qty 2

## 2022-10-06 NOTE — ED Triage Notes (Signed)
Pt arrived via [facility] from home for c/o left sided chest pain that radiates to left neck started at 1245 today. Pt c/o nausea. PT denies SOB

## 2022-10-06 NOTE — ED Notes (Signed)
Pt is a&ox4 and to her baseline. Pt complains of pain to center of her chest. Pt attached to monitor/vitals. Side rails up x 2, call light with patient. IV team at bedside to establish IV. Techs will get patient cleaned up when IV team finished.

## 2022-10-06 NOTE — H&P (Signed)
History and Physical    Lori [name] ZOX:[id number] DOB: [birth year] DOA: 10/06/2022  PCP: Caesar Bookman, NP   Patient coming from: Home   Chief Complaint:  Chief Complaint  Patient presents with   Chest Pain    HPI:  Lori [name] is a 52 y.o. female with medical history of CAD status post CABG, CVA with spastic hemiplegia, diastolic heart failure with preserved EF 60 to 65%, essential hypertension, insulin-dependent DM type II, hypothyroidism, depression, anxiety and pheochromocytoma status post adrenalectomy significant of presented to emergency department evaluation for chest pain and cough.  Patient reporting substernal chest pain chest and pressure for last 5 hours.  Chest pain radiates to her neck.  Denies any palpitation, nausea and associated diaphoresis.  Denies any chest pain like this episodes before.  Patient follows outpatient cardiology last clinic visit on January 2024.  Per chart review with previous history of CABG patient was continuing managing with aspirin, Plavix, metoprolol and Lipitor.   ED Course:  At initial presentation to ED patient is tachycardic 123, blood pressure 105/70, O2 sat 98% room air.  Temperature within normal range. Troponin 5 and 6 within normal range EKG showing sinus tachycardia heart rate 103, nonspecific T wave abnormality. BMP grossly unremarkable except elevated blood glucose 251, elevated BUN 21. CBC grossly unremarkable. Normal D-dimer level. Chest x-ray no active cardiopulmonary process.  Hello this is Dr. Francene Castle In the ED patient received LR 1 L and Tylenol 1 g.  Hospitalist has been contacted for admission.  Review of Systems:  Review of Systems  Constitutional:  Negative for chills, fever, malaise/fatigue and weight loss.  Respiratory:  Negative for cough, hemoptysis, sputum production, shortness of breath and wheezing.   Cardiovascular:  Positive for chest pain. Negative for palpitations, orthopnea, claudication  and leg swelling.  Gastrointestinal:  Negative for abdominal pain, constipation, diarrhea, heartburn, nausea and vomiting.  Genitourinary:  Negative for dysuria, frequency and urgency.  Musculoskeletal:  Negative for back pain, myalgias and neck pain.  Neurological:  Negative for dizziness and headaches.  Psychiatric/Behavioral:  The patient is not nervous/anxious.     Past Medical History:  Diagnosis Date   CAD (coronary artery disease)    Depression    Diabetes mellitus without complication (HCC)    History of CT scan    History of mammogram    History of MRI    Hypertension    Pheochromocytoma    Pheochromocytoma    Stroke [facility])    Thyroid disease     Past Surgical History:  Procedure Laterality Date   ABDOMINAL HYSTERECTOMY     CESAREAN SECTION     3   CORONARY ARTERY BYPASS GRAFT     Right adrenal gland removal for pheochromocytoma Right      reports that she has never smoked. She has never used smokeless tobacco. She reports that she does not currently use alcohol. She reports that she does not use drugs.  Allergies  Allergen Reactions   Contrast Media [Iodinated Contrast Media] Anaphylaxis and Swelling   Penicillins Swelling    Mouth swells up and eyes swollen shut   Sumatriptan Anaphylaxis   Vancomycin Other (See Comments)    Red Man's Syndrome   Aspirin Swelling   Ciprofloxacin     Feel nausea/vomitting   Morphine Swelling    Family History  Problem Relation Age of Onset   Hypertension Mother    Diabetes Mother    Atrial fibrillation Mother    Hypertension Father  Heart attack Father    Dementia Father    Diabetes Brother    Brain cancer Brother    Asthma Daughter    Sickle cell anemia Son    Atrial fibrillation Maternal Uncle     Prior to Admission medications   Medication Sig Start Date End Date Taking? Authorizing Provider  albuterol (PROVENTIL) (2.5 MG/3ML) 0.083% nebulizer solution Take 3 mLs (2.5 mg total) by nebulization every 6 (six)  hours as needed for wheezing or shortness of breath. 05/21/22   Ngetich, Dinah C, NP  albuterol (VENTOLIN HFA) 108 (90 Base) MCG/ACT inhaler INHALE 2 PUFFS BY MOUTH EVERY 6 HOURS AS NEEDED FOR WHEEZING AND FOR SHORTNESS OF BREATH 08/19/22   Ngetich, Dinah C, NP  amLODipine (NORVASC) 5 MG tablet Take 1 tablet (5 mg total) by mouth daily. 09/02/22   Ngetich, Dinah C, NP  atorvastatin (LIPITOR) 80 MG tablet Take 1 tablet (80 mg total) by mouth daily. 09/02/22   Ngetich, Dinah C, NP  azithromycin (ZITHROMAX) 250 MG tablet Take 1 tablet (250 mg total) by mouth daily. Take first 2 tablets together, then 1 every day until finished. 09/07/22   Roxy Horseman, PA-C  baclofen (LIORESAL) 10 MG tablet Take 1 tablet (10 mg total) by mouth 3 (three) times daily. X 1 week, then 10 mg TID-for spasticity- can increase  the dose if you call me to discuss 07/10/22   Maurilio Lovely D, DO  blood glucose meter kit and supplies 1 each by Other route as directed. Dispense based on patient and insurance preference. Use up to four times daily as directed. (FOR ICD-10 E10.9, E11.9). 06/24/22   Ngetich, Dinah C, NP  budesonide (PULMICORT) 0.25 MG/2ML nebulizer solution Take 2 mLs (0.25 mg total) by nebulization 2 (two) times daily. 05/21/22   Ngetich, Dinah C, NP  clopidogrel (PLAVIX) 75 MG tablet Take 1 tablet (75 mg total) by mouth daily. 09/02/22   Ngetich, Dinah C, NP  Continuous Glucose Sensor (FREESTYLE LIBRE 14 DAY SENSOR) MISC 1 each by Does not apply route as needed. DX: E11.69 08/06/22   Sharon Seller, NP  cyclobenzaprine (FLEXERIL) 5 MG tablet Take 1 tablet (5 mg total) by mouth 3 (three) times daily as needed for muscle spasms. 07/10/22   Sherryll Burger, Pratik D, DO  diclofenac Sodium (VOLTAREN) 1 % GEL Apply 4 g topically 4 (four) times daily. 06/27/22   Ngetich, Dinah C, NP  escitalopram (LEXAPRO) 20 MG tablet Take 1 tablet (20 mg total) by mouth every morning. 09/02/22   Ngetich, Dinah C, NP  ezetimibe (ZETIA) 10 MG tablet Take 1 tablet  (10 mg total) by mouth daily. 09/02/22   Ngetich, Dinah C, NP  gabapentin (NEURONTIN) 100 MG capsule Take 1 capsule (100 mg total) by mouth 3 (three) times daily. 09/02/22   Ngetich, Dinah C, NP  guaiFENesin (MUCINEX) 600 MG 12 hr tablet Take 1 tablet (600 mg total) by mouth 2 (two) times daily. 05/21/22   Ngetich, Dinah C, NP  HYDROcodone-acetaminophen (NORCO/VICODIN) 5-325 MG tablet Take 1 tablet by mouth every 6 (six) hours as needed for moderate pain. 07/10/22   Sherryll Burger, Pratik D, DO  insulin lispro (HUMALOG) 100 UNIT/ML KwikPen Inject 15 Units into the skin 3 (three) times daily with meals. For blood sugars > 150 09/17/22   Ngetich, Dinah C, NP  Insulin Pen Needle 32G X 4 MM MISC Use to inject insulin 4 times daily as directed. 05/21/22   Ngetich, Donalee Citrin, NP  KLAYESTA powder Apply 1  Application topically 2 (two) times daily. Affected areas on breast fold, groin, and sacral areas. 05/16/22   [provider]  LANTUS SOLOSTAR 100 UNIT/ML Solostar Pen INJECT 40 UNITS SUBCUTANEOUSLY AT BEDTIME 08/21/22   Ngetich, Dinah C, NP  LORazepam (ATIVAN) 0.5 MG tablet Take 1 tablet (0.5 mg total) by mouth at bedtime. 08/27/22   Ngetich, Dinah C, NP  methimazole (TAPAZOLE) 5 MG tablet Take 1 tablet (5 mg total) by mouth every morning. 09/16/22   Ngetich, Dinah C, NP  metoprolol tartrate (LOPRESSOR) 100 MG tablet TAKE 1 TABLET BY MOUTH TWICE DAILY (BREAKFAST, BEDTIME) 09/02/22   Ngetich, Dinah C, NP  nystatin cream (MYCOSTATIN) Apply 1 Application topically 2 (two) times daily. Affected areas on breast fold and groin and sacral areas 06/27/22   Ngetich, Dinah C, NP  olmesartan-hydrochlorothiazide (BENICAR HCT) 40-25 MG tablet Take 1 tablet by mouth daily. 09/02/22   Ngetich, Dinah C, NP  omeprazole (PRILOSEC) 20 MG capsule Take 1 capsule (20 mg total) by mouth daily. 09/02/22   Ngetich, Dinah C, NP  polyethylene glycol powder (GLYCOLAX/MIRALAX) 17 GM/SCOOP powder Take 17 g by mouth daily. Hold for loose stool 06/27/22    Ngetich, Dinah C, NP  predniSONE (DELTASONE) 20 MG tablet Take 2 tablets (40 mg total) by mouth daily. 09/07/22   Roxy Horseman, PA-C  rivaroxaban (XARELTO) 10 MG TABS tablet Take 1 tablet (10 mg total) by mouth daily. 09/02/22   Ngetich, Dinah C, NP  Semaglutide,0.25 or 0.5MG /DOS, 2 MG/3ML SOPN Inject 0.5 mg into the skin once a week. 09/30/22   Ngetich, Dinah C, NP  tamsulosin (FLOMAX) 0.4 MG CAPS capsule Take 1 capsule (0.4 mg total) by mouth at bedtime. 09/02/22   Ngetich, Dinah C, NP  topiramate (TOPAMAX) 25 MG tablet Take 1 tablet (25 mg total) by mouth 2 (two) times daily. 07/10/22   Sherryll Burger, Pratik D, DO  triamcinolone ointment (KENALOG) 0.5 % Apply 1 Application topically 2 (two) times daily. 06/17/22   Medina-Vargas, Monina C, NP  zinc oxide (BALMEX) 11.3 % CREA cream Apply 1 Application topically 2 (two) times daily. 06/27/22   Ngetich, Donalee Citrin, NP     Physical Exam: Vitals:   10/06/22 1436 10/06/22 1615 10/06/22 1630 10/06/22 1840  BP: 105/70 100/73 105/78   Pulse: (!) 123 (!) 113 (!) 110   Resp: (!) 22 15 19    Temp: 98.4 F (36.9 C)   98.8 F (37.1 C)  TempSrc: Oral   Oral  SpO2: 99% 99% 98%   Weight:      Height:        Physical Exam Constitutional:      General: She is not in acute distress.    Appearance: She is not ill-appearing.  HENT:     Head: Normocephalic.  Cardiovascular:     Rate and Rhythm: Regular rhythm. Tachycardia present.     Chest Wall: PMI is not displaced.     Heart sounds: Normal heart sounds. No murmur heard. Pulmonary:     Effort: Pulmonary effort is normal.     Breath sounds: Normal breath sounds. No decreased breath sounds, wheezing, rhonchi or rales.  Abdominal:     General: Bowel sounds are normal.     Palpations: Abdomen is soft.  Musculoskeletal:     Right lower leg: No edema.     Left lower leg: No edema.     Comments: Spastic paresis of bilateral lower extremities  Skin:    General: Skin is dry.  Capillary Refill: Capillary refill  takes less than 2 seconds.  Neurological:     Mental Status: She is alert and oriented to person, place, and time.  Psychiatric:        Mood and Affect: Mood normal.      Labs on Admission: I have personally reviewed following labs and imaging studies  CBC: Recent Labs  Lab 10/06/22 1520  WBC 8.7  HGB 12.2  HCT 38.3  MCV 78.6*  PLT 373   Basic Metabolic Panel: Recent Labs  Lab 10/06/22 1520  NA 136  K 4.0  CL 101  CO2 24  GLUCOSE 256*  BUN 21*  CREATININE 0.92  CALCIUM 9.1   GFR: Estimated Creatinine Clearance: 79.8 mL/min (by C-G formula based on SCr of 0.92 mg/dL). Liver Function Tests: No results for input(s): "AST", "ALT", "ALKPHOS", "BILITOT", "PROT", "ALBUMIN" in the last 168 hours. No results for input(s): "LIPASE", "AMYLASE" in the last 168 hours. No results for input(s): "AMMONIA" in the last 168 hours. Coagulation Profile: No results for input(s): "INR", "PROTIME" in the last 168 hours. Cardiac Enzymes: Recent Labs  Lab 10/06/22 1520 10/06/22 1649  TROPONINIHS 5 6   BNP (last 3 results) Recent Labs    04/30/22 1245 05/22/22 0229 09/06/22 2242  BNP 26.3 49.6 19.5   HbA1C: No results for input(s): "HGBA1C" in the last 72 hours. CBG: No results for input(s): "GLUCAP" in the last 168 hours. Lipid Profile: No results for input(s): "CHOL", "HDL", "LDLCALC", "TRIG", "CHOLHDL", "LDLDIRECT" in the last 72 hours. Thyroid Function Tests: No results for input(s): "TSH", "T4TOTAL", "FREET4", "T3FREE", "THYROIDAB" in the last 72 hours. Anemia Panel: No results for input(s): "VITAMINB12", "FOLATE", "FERRITIN", "TIBC", "IRON", "RETICCTPCT" in the last 72 hours. Urine analysis:    Component Value Date/Time   COLORURINE YELLOW 07/04/2022 2031   APPEARANCEUR CLEAR 07/04/2022 2031   LABSPEC 1.010 07/04/2022 2031   PHURINE 5.0 07/04/2022 2031   GLUCOSEU NEGATIVE 07/04/2022 2031   HGBUR NEGATIVE 07/04/2022 2031   BILIRUBINUR NEGATIVE 07/04/2022 2031    KETONESUR NEGATIVE 07/04/2022 2031   PROTEINUR NEGATIVE 07/04/2022 2031   NITRITE POSITIVE (A) 07/04/2022 2031   LEUKOCYTESUR MODERATE (A) 07/04/2022 2031    Radiological Exams on Admission: I have personally reviewed images DG Chest 2 View  Result Date: 10/06/2022 CLINICAL DATA:  Left-sided chest pain radiates to the neck. EXAM: CHEST - 2 VIEW COMPARISON:  09/06/2022 FINDINGS: The lungs are clear without focal pneumonia, edema, pneumothorax or pleural effusion. The cardiopericardial silhouette is within normal limits for size. Status post CABG. IMPRESSION: No active cardiopulmonary disease. Electronically Signed   By: Kennith Center M.D.   On: 10/06/2022 17:51    EKG: My personal interpretation of EKG shows: EKG showing sinus tachycardia heart rate 103, nonspecific T wave abnormality.  Comparing with previous EKG from 09/07/2022 unchanged finding.    Assessment/Plan: Principal Problem:   Chest pain Active Problems:   Insulin dependent type 2 diabetes mellitus (HCC)   Essential hypertension   History of CVA with spastic paresis (cerebrovascular accident)   Hypothyroidism   History of CAD (coronary artery disease)   GAD (generalized anxiety disorder)   Reactive airway disease    Assessment and Plan: Chest pain (unclear cardiac versus noncardiac) -Patient presenting with mid substernal chest pressure/chest pain for last 5 hours which radiates to left-sided neck.  Chest pain constant in nature, 5 out of 10 intensity.  Denies any associated nausea and diaphoresis. - Normal troponin level 5 and 6.  Normal D-dimer  level.   - EKG showed sinus tachycardia with some T wave inversion.Comparing with previous EKG from 09/07/2022 unchanged finding  - Chest x-ray unremarkable. -Chest pain mostly noncardiac origin however given patient has history of CAD with CABG in the past admitting patient for observation overnight. -Continue to monitor for chest pain.  Trend troponin and EKG. -Obtaining  echocardiogram. - Spoke with on-call cardiology Dr. Virgina Jock, recommended hold the formal consult for now and obtain an nuclear medicine stress test. - I have sent epic email to Salley Hews to request for nuclear medicine stress test in the a.m. -Keep patient n.p.o. after midnight - Continue cardiac monitoring.  History of CAD status post CABG -Continue Lipitor 80 mg daily, Plavix 75 mg daily, ezetimibe 10 mg daily and Lopressor 100 mg twice daily.  Diastolic heart failure with preserved EF 60 to 65% Essential hypertension -Per chart review previous echocardiogram from 01/2021 showed EF 60 to 65%, moderate left ventricular hypertrophy, grade 1 diastolic dysfunction. -The blood pressure is well-controlled.  Continue Lopressor 100 mg twice daily and amlodipine 5 mg daily.  Given blood pressure is borderline low holding Benicar for now.   History of CVA with spastic paresis -Patient has history of CVA with associated spastic paresis and mostly bedbound. - Continue Xarelto 10 mg daily, Plavix 75 mg daily, Lipitor and ezetimibe. -Due to spastic paresis continue baclofen 100 mg 3 times daily and gabapentin 100 mg 3 times daily. - Continue fall precaution  Generalized anxiety disorder -Continue Lexapro 20 mg daily and Ativan 0.5 mg at bedtime.  Hypothyroidism History of hypothyroidism currently not on any medication. Checking TSH level.  Insulin-dependent DM type II -A1c 9.8 checked on 08/26/2022 - Continue Lantus 40 unit at bedtime, Humalog 5 unit 3 times daily with meal.  Continue low sliding scale insulin as needed and blood glucose check with 3 times daily with meal and bedtime.  History of reactive airway disease - Resumed Pulmicort nebulizer twice daily and continue Proventil every 6 hour as needed for wheezing or shortness of breath.  Chronic headache - Continue Topamax 25 mg twice daily.   DVT prophylaxis:  SCDs.  On Xarelto. Code Status:  Full Code Diet: Carb modified and  heart healthy diet Family Communication:  none Disposition Plan: Tentative discharge to home next 1 to 2 days.  Waiting for nuclear medicine stress test Consults: Cardiology Admission status:   Inpatient, cardiac-telemetry unit  Severity of Illness: The appropriate patient status for this patient is OBSERVATION. Observation status is judged to be reasonable and necessary in order to provide the required intensity of service to ensure the patient's safety. The patient's presenting symptoms, physical exam findings, and initial radiographic and laboratory data in the context of their medical condition is felt to place them at decreased risk for further clinical deterioration. Furthermore, it is anticipated that the patient will be medically stable for discharge from the hospital within 2 midnights of admission.     Tereasa Coop, MD [facility]  How to contact the [facility] Attending or Consulting provider 7A - 7P or covering provider during after hours 7P -7A, for this patient.  Check the care team in [facility] and look for a) attending/consulting [facility] provider listed and b) the [facility] team listed Log into [email or web address] and use [facility] universal password to access. If you do not have the password, please contact the hospital operator. Locate the [facility] provider you are looking for under [facility] and page to a number that you can be directly  reached. If you still have difficulty reaching the provider, please page the [facility] (Director on Call) for the Hospitalists listed on amion for assistance.  10/06/2022, 10:06 PM

## 2022-10-06 NOTE — ED Notes (Signed)
Pt gone to Xray 

## 2022-10-06 NOTE — ED Provider Notes (Signed)
[place] EMERGENCY DEPARTMENT AT [facility] Provider Note   CSN: [id number] Arrival date & time: 10/06/22  1430     History  Chief Complaint  Patient presents with   Chest Pain    Lori [name] is a 52 y.o. female.  This is a 52 year old female here today for chest pain and cough.  Patient with past history significant for coronary artery disease status post CABG, CVA with residual spastic hemiplegia, diabetes, hypertension, hypothyroidism, few chromocytoma post adrenalectomy.  Patient endorses a few hours of chest pain.  She is also had a cough for 3 days.   Chest Pain      Home Medications Prior to Admission medications   Medication Sig Start Date End Date Taking? Authorizing Provider  albuterol (PROVENTIL) (2.5 MG/3ML) 0.083% nebulizer solution Take 3 mLs (2.5 mg total) by nebulization every 6 (six) hours as needed for wheezing or shortness of breath. 05/21/22   Ngetich, Dinah C, NP  albuterol (VENTOLIN HFA) 108 (90 Base) MCG/ACT inhaler INHALE 2 PUFFS BY MOUTH EVERY 6 HOURS AS NEEDED FOR WHEEZING AND FOR SHORTNESS OF BREATH 08/19/22   Ngetich, Dinah C, NP  amLODipine (NORVASC) 5 MG tablet Take 1 tablet (5 mg total) by mouth daily. 09/02/22   Ngetich, Dinah C, NP  atorvastatin (LIPITOR) 80 MG tablet Take 1 tablet (80 mg total) by mouth daily. 09/02/22   Ngetich, Dinah C, NP  azithromycin (ZITHROMAX) 250 MG tablet Take 1 tablet (250 mg total) by mouth daily. Take first 2 tablets together, then 1 every day until finished. 09/07/22   Roxy Horseman, PA-C  baclofen (LIORESAL) 10 MG tablet Take 1 tablet (10 mg total) by mouth 3 (three) times daily. X 1 week, then 10 mg TID-for spasticity- can increase  the dose if you call me to discuss 07/10/22   Maurilio Lovely D, DO  blood glucose meter kit and supplies 1 each by Other route as directed. Dispense based on patient and insurance preference. Use up to four times daily as directed. (FOR ICD-10 E10.9, E11.9). 06/24/22    Ngetich, Dinah C, NP  budesonide (PULMICORT) 0.25 MG/2ML nebulizer solution Take 2 mLs (0.25 mg total) by nebulization 2 (two) times daily. 05/21/22   Ngetich, Dinah C, NP  clopidogrel (PLAVIX) 75 MG tablet Take 1 tablet (75 mg total) by mouth daily. 09/02/22   Ngetich, Dinah C, NP  Continuous Glucose Sensor (FREESTYLE LIBRE 14 DAY SENSOR) MISC 1 each by Does not apply route as needed. DX: E11.69 08/06/22   Sharon Seller, NP  cyclobenzaprine (FLEXERIL) 5 MG tablet Take 1 tablet (5 mg total) by mouth 3 (three) times daily as needed for muscle spasms. 07/10/22   Sherryll Burger, Pratik D, DO  diclofenac Sodium (VOLTAREN) 1 % GEL Apply 4 g topically 4 (four) times daily. 06/27/22   Ngetich, Dinah C, NP  escitalopram (LEXAPRO) 20 MG tablet Take 1 tablet (20 mg total) by mouth every morning. 09/02/22   Ngetich, Dinah C, NP  ezetimibe (ZETIA) 10 MG tablet Take 1 tablet (10 mg total) by mouth daily. 09/02/22   Ngetich, Dinah C, NP  gabapentin (NEURONTIN) 100 MG capsule Take 1 capsule (100 mg total) by mouth 3 (three) times daily. 09/02/22   Ngetich, Dinah C, NP  guaiFENesin (MUCINEX) 600 MG 12 hr tablet Take 1 tablet (600 mg total) by mouth 2 (two) times daily. 05/21/22   Ngetich, Dinah C, NP  HYDROcodone-acetaminophen (NORCO/VICODIN) 5-325 MG tablet Take 1 tablet by mouth every 6 (six) hours  as needed for moderate pain. 07/10/22   Sherryll Burger, Pratik D, DO  insulin lispro (HUMALOG) 100 UNIT/ML KwikPen Inject 15 Units into the skin 3 (three) times daily with meals. For blood sugars > 150 09/17/22   Ngetich, Dinah C, NP  Insulin Pen Needle 32G X 4 MM MISC Use to inject insulin 4 times daily as directed. 05/21/22   Ngetich, Donalee Citrin, NP  KLAYESTA powder Apply 1 Application topically 2 (two) times daily. Affected areas on breast fold, groin, and sacral areas. 05/16/22   [provider]  LANTUS SOLOSTAR 100 UNIT/ML Solostar Pen INJECT 40 UNITS SUBCUTANEOUSLY AT BEDTIME 08/21/22   Ngetich, Dinah C, NP  LORazepam (ATIVAN) 0.5 MG tablet  Take 1 tablet (0.5 mg total) by mouth at bedtime. 08/27/22   Ngetich, Dinah C, NP  methimazole (TAPAZOLE) 5 MG tablet Take 1 tablet (5 mg total) by mouth every morning. 09/16/22   Ngetich, Dinah C, NP  metoprolol tartrate (LOPRESSOR) 100 MG tablet TAKE 1 TABLET BY MOUTH TWICE DAILY (BREAKFAST, BEDTIME) 09/02/22   Ngetich, Dinah C, NP  nystatin cream (MYCOSTATIN) Apply 1 Application topically 2 (two) times daily. Affected areas on breast fold and groin and sacral areas 06/27/22   Ngetich, Dinah C, NP  olmesartan-hydrochlorothiazide (BENICAR HCT) 40-25 MG tablet Take 1 tablet by mouth daily. 09/02/22   Ngetich, Dinah C, NP  omeprazole (PRILOSEC) 20 MG capsule Take 1 capsule (20 mg total) by mouth daily. 09/02/22   Ngetich, Dinah C, NP  polyethylene glycol powder (GLYCOLAX/MIRALAX) 17 GM/SCOOP powder Take 17 g by mouth daily. Hold for loose stool 06/27/22   Ngetich, Dinah C, NP  predniSONE (DELTASONE) 20 MG tablet Take 2 tablets (40 mg total) by mouth daily. 09/07/22   Roxy Horseman, PA-C  rivaroxaban (XARELTO) 10 MG TABS tablet Take 1 tablet (10 mg total) by mouth daily. 09/02/22   Ngetich, Dinah C, NP  Semaglutide,0.25 or 0.5MG /DOS, 2 MG/3ML SOPN Inject 0.5 mg into the skin once a week. 09/30/22   Ngetich, Dinah C, NP  tamsulosin (FLOMAX) 0.4 MG CAPS capsule Take 1 capsule (0.4 mg total) by mouth at bedtime. 09/02/22   Ngetich, Dinah C, NP  topiramate (TOPAMAX) 25 MG tablet Take 1 tablet (25 mg total) by mouth 2 (two) times daily. 07/10/22   Sherryll Burger, Pratik D, DO  triamcinolone ointment (KENALOG) 0.5 % Apply 1 Application topically 2 (two) times daily. 06/17/22   Medina-Vargas, Monina C, NP  zinc oxide (BALMEX) 11.3 % CREA cream Apply 1 Application topically 2 (two) times daily. 06/27/22   Ngetich, Dinah C, NP      Allergies    Contrast media [iodinated contrast media], Penicillins, Sumatriptan, Vancomycin, Aspirin, Ciprofloxacin, and Morphine    Review of Systems   Review of Systems  Cardiovascular:  Positive  for chest pain.    Physical Exam Updated Vital Signs BP 105/78   Pulse (!) 110   Temp 98.8 F (37.1 C) (Oral)   Resp 19   Ht 5\' 2"  (1.575 m)   Wt 95.6 kg   SpO2 98%   BMI 38.55 kg/m  Physical Exam Vitals reviewed.  Cardiovascular:     Rate and Rhythm: Tachycardia present.     Heart sounds: Normal heart sounds.  Pulmonary:     Effort: Pulmonary effort is normal. No respiratory distress.  Chest:     Chest wall: No mass, deformity or tenderness.  Neurological:     Mental Status: She is alert.     ED Results / Procedures /  Treatments   Labs (all labs ordered are listed, but only abnormal results are displayed) Labs Reviewed  BASIC METABOLIC PANEL - Abnormal; Notable for the following components:      Result Value   Glucose, Bld 256 (*)    BUN 21 (*)    All other components within normal limits  CBC - Abnormal; Notable for the following components:   MCV 78.6 (*)    MCH 25.1 (*)    RDW 16.9 (*)    All other components within normal limits  RESP PANEL BY RT-PCR (RSV, FLU A&B, COVID)  [id number]  D-DIMER, QUANTITATIVE  TROPONIN I (HIGH SENSITIVITY)  TROPONIN I (HIGH SENSITIVITY)    EKG EKG Interpretation Date/Time:  Sunday October 06 2022 18:45:08 EDT Ventricular Rate:  103 PR Interval:  175 QRS Duration:  87 QT Interval:  315 QTC Calculation: 413 R Axis:   89  Text Interpretation: Sinus tachycardia Nonspecific T abnormalities, diffuse leads New T Wave inversions Confirmed by Anders Simmonds [phone]) on 10/06/2022 7:21:55 PM  Radiology DG Chest 2 View  Result Date: 10/06/2022 CLINICAL DATA:  Left-sided chest pain radiates to the neck. EXAM: CHEST - 2 VIEW COMPARISON:  09/06/2022 FINDINGS: The lungs are clear without focal pneumonia, edema, pneumothorax or pleural effusion. The cardiopericardial silhouette is within normal limits for size. Status post CABG. IMPRESSION: No active cardiopulmonary disease. Electronically Signed   By: Kennith Center M.D.   On: 10/06/2022  17:51    Procedures Procedures    Medications Ordered in ED Medications  lactated ringers bolus 1,000 mL (1,000 mLs Intravenous New Bag/Given 10/06/22 1838)  acetaminophen (TYLENOL) tablet 1,000 mg (1,000 mg Oral Given 10/06/22 1834)    ED Course/ Medical Decision Making/ A&P                                 Medical Decision Making 52 year old female here today for chest pain and cough.  Differential diagnoses include pneumonia, ACS, viral syndrome, PE.  Plan-patient's initial EKG does show some T wave inversions in her lateral precordial leads.  May be rate dependent as she is tachycardic to 118.  Does not appear to be in any respiratory distress.  D-dimer ordered.  Troponin ordered.  Chest x-ray ordered.  Reassessment-patient with persistent T wave inversions even after rate is improved with IV fluids.  My independent review of the patient's chest x-ray does not show any pneumonia.  D-dimer negative.  Troponin not elevated.  At the patient's coronary artery history, her chest pain which is since resolved, and new EKG findings, will admit patient to hospitalist for chest pain.  Amount and/or Complexity of Data Reviewed Labs: ordered. Radiology: ordered.  Risk OTC drugs.           Final Clinical Impression(s) / ED Diagnoses Final diagnoses:  Chest pain, unspecified type    Rx / DC Orders ED Discharge Orders     None         Arletha Pili, DO 10/06/22 1929

## 2022-10-07 ENCOUNTER — Encounter (HOSPITAL_COMMUNITY): Payer: Self-pay | Admitting: Internal Medicine

## 2022-10-07 ENCOUNTER — Observation Stay (HOSPITAL_BASED_OUTPATIENT_CLINIC_OR_DEPARTMENT_OTHER): Payer: 59

## 2022-10-07 DIAGNOSIS — I10 Essential (primary) hypertension: Secondary | ICD-10-CM | POA: Diagnosis not present

## 2022-10-07 DIAGNOSIS — R072 Precordial pain: Secondary | ICD-10-CM

## 2022-10-07 DIAGNOSIS — R079 Chest pain, unspecified: Secondary | ICD-10-CM | POA: Diagnosis not present

## 2022-10-07 DIAGNOSIS — I361 Nonrheumatic tricuspid (valve) insufficiency: Secondary | ICD-10-CM

## 2022-10-07 LAB — LIPID PANEL
Cholesterol: 132 mg/dL (ref 0–200)
HDL: 35 mg/dL — ABNORMAL LOW (ref >40)
LDL Cholesterol: 66 mg/dL (ref 0–99)
Total CHOL/HDL Ratio: 3.8 ratio
Triglycerides: 157 mg/dL — ABNORMAL HIGH (ref <150)
VLDL: 31 mg/dL (ref 0–40)

## 2022-10-07 LAB — COMPREHENSIVE METABOLIC PANEL
ALT: 17 U/L (ref 0–44)
AST: 14 U/L — ABNORMAL LOW (ref 15–41)
Albumin: 2.7 g/dL — ABNORMAL LOW (ref 3.5–5.0)
Alkaline Phosphatase: 80 U/L (ref 38–126)
Anion gap: 13 (ref 5–15)
BUN: 21 mg/dL — ABNORMAL HIGH (ref 6–20)
CO2: 21 mmol/L — ABNORMAL LOW (ref 22–32)
Calcium: 8.8 mg/dL — ABNORMAL LOW (ref 8.9–10.3)
Chloride: 102 mmol/L (ref 98–111)
Creatinine, Ser: 0.94 mg/dL (ref 0.44–1.00)
GFR, Estimated: >60 mL/min (ref >60)
Glucose, Bld: 207 mg/dL — ABNORMAL HIGH (ref 70–99)
Potassium: 3.8 mmol/L (ref 3.5–5.1)
Sodium: 136 mmol/L (ref 135–145)
Total Bilirubin: 0.4 mg/dL (ref 0.3–1.2)
Total Protein: 6.1 g/dL — ABNORMAL LOW (ref 6.5–8.1)

## 2022-10-07 LAB — ECHOCARDIOGRAM COMPLETE
AR max vel: 1.51 cm2
AV Area VTI: 1.58 cm2
AV Area mean vel: 1.5 cm2
AV Mean grad: 4 mmHg
AV Peak grad: 8.3 mmHg
Ao pk vel: 1.44 m/s
Area-P 1/2: 3.5 cm2
Height: 62 in
S' Lateral: 2.1 cm
Weight: 3231.06 [oz_av]

## 2022-10-07 LAB — CBC
HCT: 35 % — ABNORMAL LOW (ref 36.0–46.0)
Hemoglobin: 11.3 g/dL — ABNORMAL LOW (ref 12.0–15.0)
MCH: 25.2 pg — ABNORMAL LOW (ref 26.0–34.0)
MCHC: 32.3 g/dL (ref 30.0–36.0)
MCV: 78.1 fL — ABNORMAL LOW (ref 80.0–100.0)
Platelets: 333 10*3/uL (ref 150–400)
RBC: 4.48 MIL/uL (ref 3.87–5.11)
RDW: 16.8 % — ABNORMAL HIGH (ref 11.5–15.5)
WBC: 8.3 10*3/uL (ref 4.0–10.5)
nRBC: 0 % (ref 0.0–0.2)

## 2022-10-07 LAB — GLUCOSE, CAPILLARY: Glucose-Capillary: 179 mg/dL — ABNORMAL HIGH (ref 70–99)

## 2022-10-07 LAB — TROPONIN I (HIGH SENSITIVITY)
Troponin I (High Sensitivity): 5 ng/L (ref <18)
Troponin I (High Sensitivity): 5 ng/L (ref <18)

## 2022-10-07 MED ORDER — DIPHENHYDRAMINE HCL 25 MG PO CAPS
25.0000 mg | ORAL_CAPSULE | Freq: Three times a day (TID) | ORAL | Status: DC | PRN
  Administered 2022-10-07 – 2022-10-08 (×2): 25 mg via ORAL
  Filled 2022-10-07 (×2): qty 1

## 2022-10-07 MED ORDER — PERFLUTREN LIPID MICROSPHERE
1.0000 mL | INTRAVENOUS | Status: AC | PRN
  Administered 2022-10-07: 2 mL via INTRAVENOUS

## 2022-10-07 MED ORDER — INSULIN ASPART 100 UNIT/ML IJ SOLN
5.0000 [IU] | Freq: Three times a day (TID) | INTRAMUSCULAR | Status: DC
  Administered 2022-10-07 – 2022-10-08 (×2): 5 [IU] via SUBCUTANEOUS

## 2022-10-07 NOTE — Progress Notes (Signed)
PROGRESS NOTE    Alouise Levene Becton  WGN:[id number] DOB: 24-Oct-1970 DOA: 10/06/2022 PCP: Caesar Bookman, NP    Brief Narrative:   Lori [name] is a 52 y.o. female with past medical history significant for CAD s/p CABG, CVA with spastic hemiplegia, chronic diastolic congestive heart failure, essential hypertension, DM2, hypothyroidism, depression, anxiety, history of pheochromocytoma s/p adrenalectomy, chronic headache, reactive airway disease who presented to [facility] ED on 9/1 with chest pain.  Patient reports constant chest pressure to her substernal region lasting 5 hours with radiation to her neck.  Denies any palpitations, nausea or associated diaphoresis.  No similar episodes in the past.  Denies sick contacts, no recent changes in her home medications.  In the ED, temperature 98.4 F, HR 123, RR 22, BP 105/70, SpO2 99% on room air.  WBC 8.7, hemoglobin 12.2, platelet count 373.  Sodium 136, potassium 4.0, chloride 101, CO2 24, glucose 256, BUN 21, creatinine 0.92.  Troponin 5 followed by 6.  D-dimer less than 0.27.  TSH 0.816.  Influenza/COVID/RSV PCR negative.  Chest x-ray with no active cardiopulmonary disease process.  EKG with sinus tachycardia, HR 118, nonspecific T wave abnormality.  Patient received 1 L LR bolus and 1 g Tylenol.  TRH consulted for admission for further evaluation management of chest pain.  Assessment & Plan:   Chest pain Patient presenting to the ED with mid substernal chest pressure/pain lasting for 5 hours with radiation to her left neck.  Reports 5 out of 10 intensity without nausea or diaphoresis.  EKG with sinus tachycardia and nonspecific T wave abnormalities which is unchanged from previous EKG on 09/07/2022.  D-dimer within normal limits, troponin x 2 negative.  Chest x-ray with no acute abnormalities.  Patient is afebrile without leukocytosis.  Admitting physician discussed case with on-call cardiology, Dr. Virgina Jock, who recommended nuclear  medicine stress test and no need for formal consultation at this time. -- TTE: Pending -- Add on lipid panel -- Stress test pending for 9/3, n.p.o. after midnight -- Monitor on telemetry  CAD s/p CABG -- Plavix 75 mg p.o. daily -- Atorvastatin 80 mg p.o. daily -- Zetia 10 mg p.o. daily  History of CVA with spastic hemiplegia Follows with PMR medicine outpatient. -- Baclofen 10 mg p.o. 3 times daily -- Flexeril 5 mg p.o. 3 times daily as needed muscle spasms -- Gabapentin 100 mg p.o. 3 times daily -- Resume home health orders with PT/OT/speech therapy/aide/RN on discharge  Chronic diastolic congestive heart failure Essential hypertension Previous TTE 01/2021 with LVEF 60 to 65%, moderate LVH, grade 1 diastolic dysfunction. -- Metoprolol tartrate 100 mg p.o. twice daily -- Amlodipine 5 mg p.o. daily -- Holding home olmesartan-HCTZ given borderline hypotension -- Continue monitor BP closely  Type 2 diabetes mellitus with hyperglycemia Hemoglobin A1c 9.8 on 08/27/2022, poorly controlled. -- Semglee 40 units subcutaneously daily --NovoLog 5 unit 3 times daily AC -- Resume semaglutide on discharge -- SSI for coverage -- CBGs qAC/HS  Hypothyroidism TSH 0.816, within normal limits.  Currently not on thyroid medication.  History of reactive airway disease -- Pulmicort neb twice daily -- Albuterol every 6 hours as needed wheezing/shortness of breath  Chronic headaches -- Topamax 25 mg p.o. twice daily  Anxiety/depression -- Lexapro 20 mg p.o. every morning -- Lorazepam 0.5 mg p.o. nightly  GERD: -- Continue PPI  History of pheochromocytoma s/p  adrenalectomy   DVT prophylaxis: rivaroxaban (XARELTO) tablet 10 mg Start: 10/07/22 1000 SCDs Start: 10/06/22 2005 rivaroxaban (XARELTO)  tablet 10 mg    Code Status: Full Code Family Communication: No family present at bedside this morning  Disposition Plan:  Level of care: Telemetry Cardiac Status is: Observation The  patient remains OBS appropriate and will d/c before 2 midnights.    Consultants:  Case was discussed with cardiology by admitting hospitalist  Procedures:  TTE: Pending Nuclear medicine stress test: Pending  Antimicrobials:  None   Subjective: Patient seen examined bedside, resting comfortably.  Lying in bed.  Complaining of itching around cardiac leads site/stickers.  Requesting Benadryl.  Objective: Vitals:   10/07/22 0123 10/07/22 0215 10/07/22 0240 10/07/22 0700  BP: 96/72 (!) 123/56 106/71 (!) 84/63  Pulse: 92 95 94   Resp: 17 19 17    Temp:   97.7 F (36.5 C) 97.9 F (36.6 C)  TempSrc:   Oral Oral  SpO2: 99% 97% 99%   Weight:   91.6 kg   Height:   5\' 2"  (1.575 m)     Intake/Output Summary (Last 24 hours) at 10/07/2022 0956 Last data filed at 10/07/2022 0143 Gross per 24 hour  Intake 1000 ml  Output --  Net 1000 ml   Filed Weights   10/06/22 1435 10/07/22 0240  Weight: 95.6 kg 91.6 kg    Examination:  Physical Exam: GEN: NAD, alert and oriented x 3, obese, chronically ill appearance, appears older than stated age HEENT: NCAT, PERRL, EOMI, sclera clear, MMM PULM: CTAB w/o wheezes/crackles, normal respiratory effort, on room air CV: RRR w/o M/G/R GI: abd soft, NTND, NABS, no R/G/M MSK: no peripheral edema, spastic paresis bilateral lower extremities PSYCH: normal mood/affect Integumentary: dry/intact, no rashes or wounds    Data Reviewed: I have personally reviewed following labs and imaging studies  CBC: Recent Labs  Lab 10/06/22 1520 10/07/22 0315  WBC 8.7 8.3  HGB 12.2 11.3*  HCT 38.3 35.0*  MCV 78.6* 78.1*  PLT 373 333   Basic Metabolic Panel: Recent Labs  Lab 10/06/22 1520 10/07/22 0315  NA 136 136  K 4.0 3.8  CL 101 102  CO2 24 21*  GLUCOSE 256* 207*  BUN 21* 21*  CREATININE 0.92 0.94  CALCIUM 9.1 8.8*   GFR: Estimated Creatinine Clearance: 76.2 mL/min (by C-G formula based on SCr of 0.94 mg/dL). Liver Function Tests: Recent  Labs  Lab 10/07/22 0315  AST 14*  ALT 17  ALKPHOS 80  BILITOT 0.4  PROT 6.1*  ALBUMIN 2.7*   No results for input(s): "LIPASE", "AMYLASE" in the last 168 hours. No results for input(s): "AMMONIA" in the last 168 hours. Coagulation Profile: No results for input(s): "INR", "PROTIME" in the last 168 hours. Cardiac Enzymes: No results for input(s): "CKTOTAL", "CKMB", "CKMBINDEX", "TROPONINI" in the last 168 hours. BNP (last 3 results) No results for input(s): "PROBNP" in the last 8760 hours. HbA1C: No results for input(s): "HGBA1C" in the last 72 hours. CBG: Recent Labs  Lab 10/06/22 2246 10/07/22 0258  GLUCAP 188* 179*   Lipid Profile: No results for input(s): "CHOL", "HDL", "LDLCALC", "TRIG", "CHOLHDL", "LDLDIRECT" in the last 72 hours. Thyroid Function Tests: Recent Labs    10/06/22 1649  TSH 0.816   Anemia Panel: No results for input(s): "VITAMINB12", "FOLATE", "FERRITIN", "TIBC", "IRON", "RETICCTPCT" in the last 72 hours. Sepsis Labs: No results for input(s): "PROCALCITON", "LATICACIDVEN" in the last 168 hours.  Recent Results (from the past 240 hour(s))  Resp panel by RT-PCR (RSV, Flu A&B, Covid) Anterior Nasal Swab     Status: None   Collection  Time: 10/06/22  4:27 PM   Specimen: Anterior Nasal Swab  Result Value Ref Range Status   SARS Coronavirus 2 by RT PCR NEGATIVE NEGATIVE Final   Influenza A by PCR NEGATIVE NEGATIVE Final   Influenza B by PCR NEGATIVE NEGATIVE Final    Comment: (NOTE) The Xpert Xpress SARS-CoV-2/FLU/RSV plus assay is intended as an aid in the diagnosis of influenza from Nasopharyngeal swab specimens and should not be used as a sole basis for treatment. Nasal washings and aspirates are unacceptable for Xpert Xpress SARS-CoV-2/FLU/RSV testing.  Fact Sheet for Patients: BloggerCourse.com  Fact Sheet for Healthcare Providers: SeriousBroker.it  This test is not yet approved or cleared by  the [place] FDA and has been authorized for detection and/or diagnosis of SARS-CoV-2 by FDA under an Emergency Use Authorization (EUA). This EUA will remain in effect (meaning this test can be used) for the duration of the COVID-19 declaration under Section 564(b)(1) of the Act, 21 U.S.C. section 360bbb-3(b)(1), unless the authorization is terminated or revoked.     Resp Syncytial Virus by PCR NEGATIVE NEGATIVE Final    Comment: (NOTE) Fact Sheet for Patients: BloggerCourse.com  Fact Sheet for Healthcare Providers: SeriousBroker.it  This test is not yet approved or cleared by the [place] FDA and has been authorized for detection and/or diagnosis of SARS-CoV-2 by FDA under an Emergency Use Authorization (EUA). This EUA will remain in effect (meaning this test can be used) for the duration of the COVID-19 declaration under Section 564(b)(1) of the Act, 21 U.S.C. section 360bbb-3(b)(1), unless the authorization is terminated or revoked.  Performed at [facility], [address] [place], [place] [address]          Radiology Studies: DG Chest 2 View  Result Date: 10/06/2022 CLINICAL DATA:  Left-sided chest pain radiates to the neck. EXAM: CHEST - 2 VIEW COMPARISON:  09/06/2022 FINDINGS: The lungs are clear without focal pneumonia, edema, pneumothorax or pleural effusion. The cardiopericardial silhouette is within normal limits for size. Status post CABG. IMPRESSION: No active cardiopulmonary disease. Electronically Signed   By: Kennith Center M.D.   On: 10/06/2022 17:51        Scheduled Meds:  amLODipine  5 mg Oral Daily   atorvastatin  80 mg Oral Daily   baclofen  10 mg Oral TID   budesonide  0.25 mg Nebulization BID   clopidogrel  75 mg Oral Daily   escitalopram  20 mg Oral q morning   ezetimibe  10 mg Oral Daily   gabapentin  100 mg Oral TID   insulin aspart  0-6 Units Subcutaneous TID WC   insulin  glargine-yfgn  40 Units Subcutaneous QHS   LORazepam  0.5 mg Oral QHS   metoprolol tartrate  100 mg Oral BID   pantoprazole  40 mg Oral Daily   polyethylene glycol  17 g Oral Daily   rivaroxaban  10 mg Oral Daily   sodium chloride flush  3 mL Intravenous Q12H   topiramate  25 mg Oral BID   Continuous Infusions:  sodium chloride       LOS: 0 days    Time spent: 54 minutes spent on chart review, discussion with nursing staff, consultants, updating family and interview/physical exam; more than 50% of that time was spent in counseling and/or coordination of care.    Alvira Philips [place], DO [facility] Available via Epic secure chat 7am-7pm After these hours, please refer to coverage provider listed on [email or web address] 10/07/2022, 9:56 AM

## 2022-10-07 NOTE — Consult Note (Signed)
Cardiology Consultation:   Patient ID: Lori [name]; [id number]; 10-04-1970   Admit date: 10/06/2022 Date of Consult: 10/07/2022  Primary Care Provider: Caesar Bookman, NP Primary Cardiologist: Orbie Pyo, MD  Primary Electrophysiologist:  None   Patient Profile:   Lori [name] is a 52 y.o. female with a hx of CAD/CABG who is being seen today for the evaluation  at the request of chest pain.  History of Present Illness:   [name] hs a hx of CAD s/p CABG.  She had an Echo 01/21/2021 which revealed LVEF .  She has had previous pheochromocytoma resected.  She had a CABG in [place] in 2018.  She does not report stress testing.  I do not see any cardiac stress test in the system and I do not have the details of the bypass.  She has been followed in our clinic.  She has had strokes and left hemiplegia.  Sounds like she is bedbound although she can get up with help from her husband, pivot to a lift chair and a bedside commode.  She said yesterday she got back into bed and was finished eating when she developed chest discomfort.  It was 5 out of 10.  His mid chest.  There did not seem to be any radiation.  She did not say there was any nausea vomiting or diaphoresis.  Was not any clear shortness of breath.  She has not had any PND or orthopnea.       In the ED enzymes were negative.  She had sinus tachycardia and nonspecific T wave inversions which are unchanged from previous.   She was given some IV fluid.  She was admitted for observation.  She states she has not had any further chest discomfort.  She does not say she was having this 2 to 3 days ago.  This really happened yesterday.  She has not had any new nausea vomiting.  She has not had any new shortness of breath.  Again she is very limited in her activities.  I do see that her last ejection fraction was normal with moderate LVH.  There were no significant valvular abnormalities.   Past Medical  History:  Diagnosis Date   CAD (coronary artery disease)    Depression    Diabetes mellitus without complication (HCC)    History of CT scan    History of mammogram    History of MRI    Hypertension    Pheochromocytoma    Pheochromocytoma    Stroke [facility])    Thyroid disease     Past Surgical History:  Procedure Laterality Date   ABDOMINAL HYSTERECTOMY     CESAREAN SECTION     3   CORONARY ARTERY BYPASS GRAFT     Right adrenal gland removal for pheochromocytoma Right      Home Medications:  Prior to Admission medications   Medication Sig Start Date End Date Taking? Authorizing Provider  albuterol (PROVENTIL) (2.5 MG/3ML) 0.083% nebulizer solution Take 3 mLs (2.5 mg total) by nebulization every 6 (six) hours as needed for wheezing or shortness of breath. 05/21/22  Yes Ngetich, Dinah C, NP  albuterol (VENTOLIN HFA) 108 (90 Base) MCG/ACT inhaler INHALE 2 PUFFS BY MOUTH EVERY 6 HOURS AS NEEDED FOR WHEEZING AND FOR SHORTNESS OF BREATH 08/19/22  Yes Ngetich, Dinah C, NP  amLODipine (NORVASC) 5 MG tablet Take 1 tablet (5 mg total) by mouth daily. 09/02/22  Yes Ngetich, Donalee Citrin, NP  atorvastatin (LIPITOR) 80 MG tablet Take 1 tablet (80 mg total) by mouth daily. 09/02/22  Yes Ngetich, Dinah C, NP  baclofen (LIORESAL) 10 MG tablet Take 1 tablet (10 mg total) by mouth 3 (three) times daily. X 1 week, then 10 mg TID-for spasticity- can increase  the dose if you call me to discuss 07/10/22  Yes Sherryll Burger, Pratik D, DO  budesonide (PULMICORT) 0.25 MG/2ML nebulizer solution Take 2 mLs (0.25 mg total) by nebulization 2 (two) times daily. 05/21/22  Yes Ngetich, Dinah C, NP  clopidogrel (PLAVIX) 75 MG tablet Take 1 tablet (75 mg total) by mouth daily. 09/02/22  Yes Ngetich, Dinah C, NP  cyclobenzaprine (FLEXERIL) 5 MG tablet Take 1 tablet (5 mg total) by mouth 3 (three) times daily as needed for muscle spasms. 07/10/22  Yes Shah, Pratik D, DO  diclofenac Sodium (VOLTAREN) 1 % GEL Apply 4 g topically 4 (four) times  daily. 06/27/22  Yes Ngetich, Dinah C, NP  doxycycline (VIBRA-TABS) 100 MG tablet Take 100 mg by mouth 2 (two) times daily. 09/30/22  Yes [provider]  escitalopram (LEXAPRO) 20 MG tablet Take 1 tablet (20 mg total) by mouth every morning. 09/02/22  Yes Ngetich, Dinah C, NP  ezetimibe (ZETIA) 10 MG tablet Take 1 tablet (10 mg total) by mouth daily. 09/02/22  Yes Ngetich, Dinah C, NP  gabapentin (NEURONTIN) 100 MG capsule Take 1 capsule (100 mg total) by mouth 3 (three) times daily. 09/02/22  Yes Ngetich, Dinah C, NP  insulin lispro (HUMALOG) 100 UNIT/ML KwikPen Inject 15 Units into the skin 3 (three) times daily with meals. For blood sugars > 150 09/17/22  Yes Ngetich, Dinah C, NP  KLAYESTA powder Apply 1 Application topically 2 (two) times daily. Affected areas on breast fold, groin, and sacral areas. 05/16/22  Yes [provider]  LANTUS SOLOSTAR 100 UNIT/ML Solostar Pen INJECT 40 UNITS SUBCUTANEOUSLY AT BEDTIME 08/21/22  Yes Ngetich, Dinah C, NP  LORazepam (ATIVAN) 0.5 MG tablet Take 1 tablet (0.5 mg total) by mouth at bedtime. 08/27/22  Yes Ngetich, Dinah C, NP  methimazole (TAPAZOLE) 5 MG tablet Take 1 tablet (5 mg total) by mouth every morning. 09/16/22  Yes Ngetich, Dinah C, NP  metoprolol tartrate (LOPRESSOR) 100 MG tablet TAKE 1 TABLET BY MOUTH TWICE DAILY (BREAKFAST, BEDTIME) 09/02/22  Yes Ngetich, Dinah C, NP  olmesartan-hydrochlorothiazide (BENICAR HCT) 40-25 MG tablet Take 1 tablet by mouth daily. 09/02/22  Yes Ngetich, Dinah C, NP  omeprazole (PRILOSEC) 20 MG capsule Take 1 capsule (20 mg total) by mouth daily. 09/02/22  Yes Ngetich, Dinah C, NP  polyethylene glycol powder (GLYCOLAX/MIRALAX) 17 GM/SCOOP powder Take 17 g by mouth daily. Hold for loose stool 06/27/22  Yes Ngetich, Dinah C, NP  rivaroxaban (XARELTO) 10 MG TABS tablet Take 1 tablet (10 mg total) by mouth daily. 09/02/22  Yes Ngetich, Dinah C, NP  Semaglutide,0.25 or 0.5MG /DOS, 2 MG/3ML SOPN Inject 0.5 mg into the skin  once a week. 09/30/22  Yes Ngetich, Dinah C, NP  tamsulosin (FLOMAX) 0.4 MG CAPS capsule Take 1 capsule (0.4 mg total) by mouth at bedtime. 09/02/22  Yes Ngetich, Dinah C, NP  topiramate (TOPAMAX) 25 MG tablet Take 1 tablet (25 mg total) by mouth 2 (two) times daily. 07/10/22  Yes Shah, Pratik D, DO  triamcinolone ointment (KENALOG) 0.5 % Apply 1 Application topically 2 (two) times daily. 06/17/22  Yes Medina-Vargas, Monina C, NP  zinc oxide (BALMEX) 11.3 % CREA cream Apply 1 Application topically 2 (  two) times daily. 06/27/22  Yes Ngetich, Dinah C, NP  blood glucose meter kit and supplies 1 each by Other route as directed. Dispense based on patient and insurance preference. Use up to four times daily as directed. (FOR ICD-10 E10.9, E11.9). 06/24/22   Ngetich, Dinah C, NP  Continuous Glucose Sensor (FREESTYLE LIBRE 14 DAY SENSOR) MISC 1 each by Does not apply route as needed. DX: E11.69 08/06/22   Sharon Seller, NP  Insulin Pen Needle 32G X 4 MM MISC Use to inject insulin 4 times daily as directed. 05/21/22   Ngetich, Donalee Citrin, NP    Inpatient Medications: Scheduled Meds:  amLODipine  5 mg Oral Daily   atorvastatin  80 mg Oral Daily   baclofen  10 mg Oral TID   budesonide  0.25 mg Nebulization BID   clopidogrel  75 mg Oral Daily   escitalopram  20 mg Oral q morning   ezetimibe  10 mg Oral Daily   gabapentin  100 mg Oral TID   insulin aspart  0-6 Units Subcutaneous TID WC   insulin glargine-yfgn  40 Units Subcutaneous QHS   LORazepam  0.5 mg Oral QHS   metoprolol tartrate  100 mg Oral BID   pantoprazole  40 mg Oral Daily   polyethylene glycol  17 g Oral Daily   rivaroxaban  10 mg Oral Daily   sodium chloride flush  3 mL Intravenous Q12H   topiramate  25 mg Oral BID   Continuous Infusions:  sodium chloride     PRN Meds: sodium chloride, acetaminophen **OR** acetaminophen, albuterol, cyclobenzaprine, diphenhydrAMINE, nitroGLYCERIN, ondansetron **OR** ondansetron (ZOFRAN) IV, sodium chloride  flush  Allergies:    Allergies  Allergen Reactions   Contrast Media [Iodinated Contrast Media] Anaphylaxis and Swelling   Penicillins Swelling    Mouth swells up and eyes swollen shut   Sumatriptan Anaphylaxis   Vancomycin Other (See Comments)    Red Man's Syndrome   Aspirin Swelling   Ciprofloxacin     Feel nausea/vomitting   Morphine Swelling    Social History:   Social History   Socioeconomic History   Marital status: Married    Spouse name: Not on file   Number of children: Not on file   Years of education: Not on file   Highest education level: Not on file  Occupational History   Not on file  Tobacco Use   Smoking status: Never   Smokeless tobacco: Never  Vaping Use   Vaping status: Never Used  Substance and Sexual Activity   Alcohol use: Not Currently   Drug use: Never   Sexual activity: Not on file  Other Topics Concern   Not on file  Social History Narrative   Tobacco use, amount per day now: Never   Past tobacco use, amount per day: Never   How many years did you use tobacco: Never   Alcohol use (drinks per week): Not Currently.   Diet: Eat out a lot.    Do you drink/eat things with caffeine: Sweet Tea   Marital status:   Married                               What year were you married? 2000   Do you live in a house, apartment, assisted living, condo, trailer, etc.? House   Is it one or more stories? 1   How many persons live in your home? 2  Do you have pets in your home?( please list) No   Highest Level of education completed? Bachelors Degree.   Current or past profession: Solicitor, [employer]   Do you exercise?   No                               Type and how often?   Do you have a living will? No   Do you have a DNR form?       No                            If not, do you want to discuss one?   Do you have signed POA/HPOA forms?   No                     If so, please bring to you appointment       Do you have any difficulty bathing or dressing yourself? Yes   Do you have any difficulty preparing food or eating? Yes   Do you have any difficulty managing your medications? Yes   Do you have any difficulty managing your finances? No   Do you have any difficulty affording your medications? Yes   Social Determinants of Health   Financial Resource Strain: Not on file  Food Insecurity: No Food Insecurity (10/07/2022)   Hunger Vital Sign    Worried About Running Out of Food in the Last Year: Never true    Ran Out of Food in the Last Year: Never true  Transportation Needs: No Transportation Needs (10/07/2022)   PRAPARE - Administrator, Civil Service (Medical): No    Lack of Transportation (Non-Medical): No  Physical Activity: Not on file  Stress: Not on file  Social Connections: Not on file  Intimate Partner Violence: Not At Risk (10/07/2022)   Humiliation, Afraid, Rape, and Kick questionnaire    Fear of Current or Ex-Partner: No    Emotionally Abused: No    Physically Abused: No    Sexually Abused: No    Family History:    Family History  Problem Relation Age of Onset   Hypertension Mother    Diabetes Mother    Atrial fibrillation Mother    Hypertension Father    Heart attack Father    Dementia Father    Diabetes Brother    Brain cancer Brother    Asthma Daughter    Sickle cell anemia Son    Atrial fibrillation Maternal Uncle      ROS:  Please see the history of present illness.  Positive for chronic constipation All other ROS reviewed and negative.     Physical Exam/Data:   Vitals:   10/07/22 0123 10/07/22 0215 10/07/22 0240 10/07/22 0700  BP: 96/72 (!) 123/56 106/71 (!) 84/63  Pulse: 92 95 94   Resp: 17 19 17    Temp:   97.7 F (36.5 C) 97.9 F (36.6 C)  TempSrc:   Oral Oral  SpO2: 99% 97% 99%   Weight:   91.6 kg   Height:   5\' 2"  (1.575 m)     Intake/Output Summary (Last 24 hours) at 10/07/2022 1018 Last data filed at 10/07/2022 0143 Gross per 24  hour  Intake 1000 ml  Output --  Net 1000 ml   Filed Weights   10/06/22 1435 10/07/22 0240  Weight: 95.6 kg 91.6 kg   Body mass index is 36.94 kg/m.  GENERAL: Chronically ill appearing HEENT:   Pupils equal round and reactive, fundi not visualized, oral mucosa unremarkable NECK:  No  jugular venous distention, waveform within normal limits, carotid upstroke brisk and symmetric, no bruits, no thyromegaly LYMPHATICS:  No cervical, inguinal adenopathy LUNGS:   Clear to auscultation bilaterally BACK:  No CVA tenderness CHEST:   Unremarkable HEART:  PMI not displaced or sustained,S1 and S2 within normal limits, no S3, no S4, no clicks, no rubs, no murmurs ABD:  Flat, positive bowel sounds normal in frequency in pitch, no bruits, no rebound, no guarding, no midline pulsatile mass, no hepatomegaly, no splenomegaly EXT:  2 plus pulses throughout, no  edema, no cyanosis no clubbing SKIN:  No rashes no nodules NEURO: Left-sided upper and lower extremity weakness PSYCH:    Cognitively intact, oriented to person place and time   EKG:  The EKG was personally reviewed and demonstrates:    Sinus tachycardia rate 103.  Non specific diffuse T wave changes not changed from previous.   Telemetry:  Telemetry was personally reviewed and demonstrates: Sinus tachycardia  Relevant CV Studies: ECHO ordered  Laboratory Data:  Chemistry Recent Labs  Lab 10/06/22 1520 10/07/22 0315  NA 136 136  K 4.0 3.8  CL 101 102  CO2 24 21*  GLUCOSE 256* 207*  BUN 21* 21*  CREATININE 0.92 0.94  CALCIUM 9.1 8.8*  GFRNONAA >60 >60  ANIONGAP 11 13    Recent Labs  Lab 10/07/22 0315  PROT 6.1*  ALBUMIN 2.7*  AST 14*  ALT 17  ALKPHOS 80  BILITOT 0.4   Hematology Recent Labs  Lab 10/06/22 1520 10/07/22 0315  WBC 8.7 8.3  RBC 4.87 4.48  HGB 12.2 11.3*  HCT 38.3 35.0*  MCV 78.6* 78.1*  MCH 25.1* 25.2*  MCHC 31.9 32.3  RDW 16.9* 16.8*  PLT 373 333   Cardiac EnzymesNo results for input(s):  "TROPONINI" in the last 168 hours. No results for input(s): "TROPIPOC" in the last 168 hours.  BNPNo results for input(s): "BNP", "PROBNP" in the last 168 hours.  DDimer  Recent Labs  Lab 10/06/22 1649  DDIMER <0.27    Radiology/Studies:  DG Chest 2 View  Result Date: 10/06/2022 CLINICAL DATA:  Left-sided chest pain radiates to the neck. EXAM: CHEST - 2 VIEW COMPARISON:  09/06/2022 FINDINGS: The lungs are clear without focal pneumonia, edema, pneumothorax or pleural effusion. The cardiopericardial silhouette is within normal limits for size. Status post CABG. IMPRESSION: No active cardiopulmonary disease. Electronically Signed   By: Kennith Center M.D.   On: 10/06/2022 17:51    Assessment and Plan:   Chest pain: Her chest pain has nonanginal greater than anginal features.  There is no objective evidence of ischemia and the enzymes are negative and EKG is nonacute.  She is on a good medical regimen for her blood pressure control.  If she remains tachycardic at baseline we could consider Corlanor.  Also is not on nitrates if she has continued pain which could complete her optimal medical therapy.  I would suggest in the absence of active ischemia outpatient stress perfusion if the echocardiogram ordered here shows no new regional wall motion abnormalities  Chronic diastolic HF: She seems to be euvolemic.  In fact she was given some fluids in the hospital.  No change in therapy.  CAD:  As above.    History of strokes:  Felt to be related  to uncontrolled HTN.     Dyslipidemia:  Continue current statin.    DM:  Per primary team.     HTN:    This has been difficult to control and followed in the advanced HF clinic.  Currently actually running somewhat low.  If this persists, peeled back off a little bit on medications.  This would also be more of an indication to use Corlanor for anginal management.  Of note TSH was normal.  For questions or updates, please contact [facility] Please  consult [email or web address] for contact info under Cardiology/STEMI.   Signed, Rollene Rotunda, MD  10/07/2022 10:18 AM

## 2022-10-07 NOTE — ED Notes (Addendum)
ED TO INPATIENT HANDOFF REPORT  ED Nurse Name and Phone #: Seriah Brotzman/ [phone]  S Name/Age/Gender Lori [name] 52 y.o. female Room/Bed: 005C/005C  Code Status   Code Status: Full Code  Home/SNF/Other Home Patient oriented to: self, place, time, and situation Is this baseline? Yes   Triage Complete: Triage complete  Chief Complaint Chest pain [R07.9]  Triage Note Pt arrived via [facility] from home for c/o left sided chest pain that radiates to left neck started at 1245 today. Pt c/o nausea. PT denies SOB   Allergies Allergies  Allergen Reactions   Contrast Media [Iodinated Contrast Media] Anaphylaxis and Swelling   Penicillins Swelling    Mouth swells up and eyes swollen shut   Sumatriptan Anaphylaxis   Vancomycin Other (See Comments)    Red Man's Syndrome   Aspirin Swelling   Ciprofloxacin     Feel nausea/vomitting   Morphine Swelling    Level of Care/Admitting Diagnosis ED Disposition     ED Disposition  Admit   Condition  --   Comment  Hospital Area: [facility] [[id number]]  Level of Care: Telemetry Cardiac [103]  May place patient in observation at [facility] or [facility] if equivalent level of care is available:: No  Covid Evaluation: Asymptomatic - no recent exposure (last 10 days) testing not required  Diagnosis: Chest pain [147829]  Admitting Physician: Tereasa Coop [[id number]]  Attending Physician: Tereasa Coop [[id number]]          B Medical/Surgery History Past Medical History:  Diagnosis Date   CAD (coronary artery disease)    Depression    Diabetes mellitus without complication (HCC)    History of CT scan    History of mammogram    History of MRI    Hypertension    Pheochromocytoma    Pheochromocytoma    Stroke [facility])    Thyroid disease    Past Surgical History:  Procedure Laterality Date   ABDOMINAL HYSTERECTOMY     CESAREAN SECTION     3   CORONARY ARTERY BYPASS GRAFT     Right adrenal gland removal for  pheochromocytoma Right      A IV Location/Drains/Wounds Patient Lines/Drains/Airways Status     Active Line/Drains/Airways     Name Placement date Placement time Site Days   Peripheral IV 10/06/22 22 G 1" Right;Anterior Forearm 10/06/22  2103  Forearm  1            Intake/Output Last 24 hours  Intake/Output Summary (Last 24 hours) at 10/07/2022 0143 Last data filed at 10/07/2022 0143 Gross per 24 hour  Intake 1000 ml  Output --  Net 1000 ml    Labs/Imaging Results for orders placed or performed during the hospital encounter of 10/06/22 (from the past 48 hour(s))  Basic metabolic panel     Status: Abnormal   Collection Time: 10/06/22  3:20 PM  Result Value Ref Range   Sodium 136 135 - 145 mmol/L   Potassium 4.0 3.5 - 5.1 mmol/L   Chloride 101 98 - 111 mmol/L   CO2 24 22 - 32 mmol/L   Glucose, Bld 256 (H) 70 - 99 mg/dL    Comment: Glucose reference range applies only to samples taken after fasting for at least 8 hours.   BUN 21 (H) 6 - 20 mg/dL   Creatinine, Ser 6.29 0.44 - 1.00 mg/dL   Calcium 9.1 8.9 - 52.8 mg/dL   GFR, Estimated >41 >32 mL/min    Comment: (NOTE) Calculated  using the CKD-EPI Creatinine Equation (2021)    Anion gap 11 5 - 15    Comment: Performed at [facility], [address], [place], [place] [address]  CBC     Status: Abnormal   Collection Time: 10/06/22  3:20 PM  Result Value Ref Range   WBC 8.7 4.0 - 10.5 K/uL   RBC 4.87 3.87 - 5.11 MIL/uL   Hemoglobin 12.2 12.0 - 15.0 g/dL   HCT 60.4 54.0 - 98.1 %   MCV 78.6 (L) 80.0 - 100.0 fL   MCH 25.1 (L) 26.0 - 34.0 pg   MCHC 31.9 30.0 - 36.0 g/dL   RDW 19.1 (H) 47.8 - 29.5 %   Platelets 373 150 - 400 K/uL   nRBC 0.0 0.0 - 0.2 %    Comment: Performed at [facility], [address], [place], [place] [address]  Troponin I (High Sensitivity)     Status: None   Collection Time: 10/06/22  3:20 PM  Result Value Ref Range   Troponin I (High Sensitivity) 5 <18 ng/L    Comment:  (NOTE) Elevated high sensitivity troponin I (hsTnI) values and significant  changes across serial measurements may suggest ACS but many other  chronic and acute conditions are known to elevate hsTnI results.  Refer to the "Links" section for chest pain algorithms and additional  guidance. Performed at [facility], [address] [place], [place] [address]   Resp panel by RT-PCR (RSV, Flu A&B, Covid) Anterior Nasal Swab     Status: None   Collection Time: 10/06/22  4:27 PM   Specimen: Anterior Nasal Swab  Result Value Ref Range   SARS Coronavirus 2 by RT PCR NEGATIVE NEGATIVE   Influenza A by PCR NEGATIVE NEGATIVE   Influenza B by PCR NEGATIVE NEGATIVE    Comment: (NOTE) The Xpert Xpress SARS-CoV-2/FLU/RSV plus assay is intended as an aid in the diagnosis of influenza from Nasopharyngeal swab specimens and should not be used as a sole basis for treatment. Nasal washings and aspirates are unacceptable for Xpert Xpress SARS-CoV-2/FLU/RSV testing.  Fact Sheet for Patients: BloggerCourse.com  Fact Sheet for Healthcare Providers: SeriousBroker.it  This test is not yet approved or cleared by the [place] FDA and has been authorized for detection and/or diagnosis of SARS-CoV-2 by FDA under an Emergency Use Authorization (EUA). This EUA will remain in effect (meaning this test can be used) for the duration of the COVID-19 declaration under Section 564(b)(1) of the Act, 21 U.S.C. section 360bbb-3(b)(1), unless the authorization is terminated or revoked.     Resp Syncytial Virus by PCR NEGATIVE NEGATIVE    Comment: (NOTE) Fact Sheet for Patients: BloggerCourse.com  Fact Sheet for Healthcare Providers: SeriousBroker.it  This test is not yet approved or cleared by the [place] FDA and has been authorized for detection and/or diagnosis of SARS-CoV-2 by FDA under an  Emergency Use Authorization (EUA). This EUA will remain in effect (meaning this test can be used) for the duration of the COVID-19 declaration under Section 564(b)(1) of the Act, 21 U.S.C. section 360bbb-3(b)(1), unless the authorization is terminated or revoked.  Performed at [facility], [address], [place], [place] [address]   Troponin I (High Sensitivity)     Status: None   Collection Time: 10/06/22  4:49 PM  Result Value Ref Range   Troponin I (High Sensitivity) 6 <18 ng/L    Comment: (NOTE) Elevated high sensitivity troponin I (hsTnI) values and  significant  changes across serial measurements may suggest ACS but many other  chronic and acute conditions are known to elevate hsTnI results.  Refer to the "Links" section for chest pain algorithms and additional  guidance. Performed at [facility], [address], [place], [place] [address]   D-dimer, quantitative     Status: None   Collection Time: 10/06/22  4:49 PM  Result Value Ref Range   D-Dimer, Quant <0.27 0.00 - 0.50 ug/mL-FEU    Comment: (NOTE) At the manufacturer cut-off value of 0.5 g/mL FEU, this assay has a negative predictive value of 95-100%.This assay is intended for use in conjunction with a clinical pretest probability (PTP) assessment model to exclude pulmonary embolism (PE) and deep venous thrombosis (DVT) in outpatients suspected of PE or DVT. Results should be correlated with clinical presentation. Performed at [facility], [address] [place], [place] [address]   TSH     Status: None   Collection Time: 10/06/22  4:49 PM  Result Value Ref Range   TSH 0.816 0.350 - 4.500 uIU/mL    Comment: Performed by a 3rd Generation assay with a functional sensitivity of <=0.01 uIU/mL. Performed at [facility], [address], [place], [place] [address]   CBG monitoring, ED     Status: Abnormal   Collection Time: 10/06/22 10:46 PM  Result Value Ref Range   Glucose-Capillary  188 (H) 70 - 99 mg/dL    Comment: Glucose reference range applies only to samples taken after fasting for at least 8 hours.   Comment 1 Notify RN    Comment 2 Document in Chart    DG Chest 2 View  Result Date: 10/06/2022 CLINICAL DATA:  Left-sided chest pain radiates to the neck. EXAM: CHEST - 2 VIEW COMPARISON:  09/06/2022 FINDINGS: The lungs are clear without focal pneumonia, edema, pneumothorax or pleural effusion. The cardiopericardial silhouette is within normal limits for size. Status post CABG. IMPRESSION: No active cardiopulmonary disease. Electronically Signed   By: Kennith Center M.D.   On: 10/06/2022 17:51    Pending Labs Unresulted Labs (From admission, onward)     Start     Ordered   10/07/22 0500  Comprehensive metabolic panel  Tomorrow morning,   R       Question:  Specimen collection method  Answer:  Lab=Lab collect   10/06/22 2004   10/07/22 0500  CBC  Tomorrow morning,   R       Question:  Specimen collection method  Answer:  Lab=Lab collect   10/06/22 2004            Vitals/Pain Today's Vitals   10/07/22 0011 10/07/22 0030 10/07/22 0045 10/07/22 0100  BP:  91/71 92/60 (!) 88/50  Pulse:  94 95 90  Resp:  15 15 19   Temp: 98.4 F (36.9 C)     TempSrc: Oral     SpO2:  96% 97% 97%  Weight:      Height:      PainSc:        Isolation Precautions No active isolations  Medications Medications  amLODipine (NORVASC) tablet 5 mg (has no administration in time range)  atorvastatin (LIPITOR) tablet 80 mg (80 mg Oral Given 10/07/22 0012)  ezetimibe (ZETIA) tablet 10 mg (has no administration in time range)  escitalopram (LEXAPRO) tablet 20 mg (has no administration in time range)  LORazepam (ATIVAN) tablet 0.5 mg (0.5 mg Oral Given 10/07/22 0012)  insulin glargine-yfgn (SEMGLEE) injection  40 Units (40 Units Subcutaneous Given 10/07/22 0024)  pantoprazole (PROTONIX) EC tablet 40 mg (has no administration in time range)  polyethylene glycol (MIRALAX / GLYCOLAX) packet  17 g (has no administration in time range)  clopidogrel (PLAVIX) tablet 75 mg (has no administration in time range)  baclofen (LIORESAL) tablet 10 mg (10 mg Oral Given 10/07/22 0012)  cyclobenzaprine (FLEXERIL) tablet 5 mg (has no administration in time range)  gabapentin (NEURONTIN) capsule 100 mg (100 mg Oral Given 10/07/22 0012)  topiramate (TOPAMAX) tablet 25 mg (25 mg Oral Given 10/07/22 0013)  albuterol (PROVENTIL) (2.5 MG/3ML) 0.083% nebulizer solution 2.5 mg (has no administration in time range)  budesonide (PULMICORT) nebulizer solution 0.25 mg (0.25 mg Nebulization Given 10/07/22 0109)  sodium chloride flush (NS) 0.9 % injection 3 mL (3 mLs Intravenous Not Given 10/06/22 2313)  sodium chloride flush (NS) 0.9 % injection 3 mL (has no administration in time range)  0.9 %  sodium chloride infusion (has no administration in time range)  acetaminophen (TYLENOL) tablet 650 mg (has no administration in time range)    Or  acetaminophen (TYLENOL) suppository 650 mg (has no administration in time range)  ondansetron (ZOFRAN) tablet 4 mg (has no administration in time range)    Or  ondansetron (ZOFRAN) injection 4 mg (has no administration in time range)  nitroGLYCERIN (NITROSTAT) SL tablet 0.4 mg (has no administration in time range)  insulin aspart (novoLOG) injection 0-6 Units (has no administration in time range)  rivaroxaban (XARELTO) tablet 10 mg (has no administration in time range)  metoprolol tartrate (LOPRESSOR) tablet 100 mg (has no administration in time range)  lactated ringers bolus 1,000 mL (0 mLs Intravenous Stopped 10/07/22 0143)  acetaminophen (TYLENOL) tablet 1,000 mg (1,000 mg Oral Given 10/06/22 1834)    Mobility non-ambulatory     Focused Assessments     R Recommendations: See Admitting Provider Note  Report given to:   Additional Notes: Pt came in for CP. She has a 22 G in the R FA via ultrasound. Her pressures have been a little soft and she got 1L of LR. She wears a  brief.

## 2022-10-07 NOTE — Progress Notes (Signed)
  Cardiology Yvonna Alanis, [place] informed me that will not able to do the stress test today. -Resuming patient's diet.  Tereasa Coop, MD [facility] 10/07/2022, 6:28 AM

## 2022-10-08 DIAGNOSIS — E039 Hypothyroidism, unspecified: Secondary | ICD-10-CM | POA: Diagnosis present

## 2022-10-08 DIAGNOSIS — R944 Abnormal results of kidney function studies: Secondary | ICD-10-CM | POA: Diagnosis present

## 2022-10-08 DIAGNOSIS — F411 Generalized anxiety disorder: Secondary | ICD-10-CM | POA: Diagnosis present

## 2022-10-08 DIAGNOSIS — I201 Angina pectoris with documented spasm: Secondary | ICD-10-CM | POA: Diagnosis not present

## 2022-10-08 DIAGNOSIS — Z1152 Encounter for screening for COVID-19: Secondary | ICD-10-CM | POA: Diagnosis not present

## 2022-10-08 DIAGNOSIS — Z7401 Bed confinement status: Secondary | ICD-10-CM | POA: Diagnosis not present

## 2022-10-08 DIAGNOSIS — I25119 Atherosclerotic heart disease of native coronary artery with unspecified angina pectoris: Secondary | ICD-10-CM | POA: Diagnosis present

## 2022-10-08 DIAGNOSIS — I200 Unstable angina: Secondary | ICD-10-CM | POA: Diagnosis not present

## 2022-10-08 DIAGNOSIS — R079 Chest pain, unspecified: Secondary | ICD-10-CM | POA: Diagnosis not present

## 2022-10-08 DIAGNOSIS — Z951 Presence of aortocoronary bypass graft: Secondary | ICD-10-CM | POA: Diagnosis not present

## 2022-10-08 DIAGNOSIS — F32A Depression, unspecified: Secondary | ICD-10-CM | POA: Diagnosis present

## 2022-10-08 DIAGNOSIS — G459 Transient cerebral ischemic attack, unspecified: Secondary | ICD-10-CM | POA: Diagnosis not present

## 2022-10-08 DIAGNOSIS — J45909 Unspecified asthma, uncomplicated: Secondary | ICD-10-CM | POA: Diagnosis present

## 2022-10-08 DIAGNOSIS — Z7901 Long term (current) use of anticoagulants: Secondary | ICD-10-CM | POA: Diagnosis not present

## 2022-10-08 DIAGNOSIS — Z5329 Procedure and treatment not carried out because of patient's decision for other reasons: Secondary | ICD-10-CM | POA: Diagnosis not present

## 2022-10-08 DIAGNOSIS — E1165 Type 2 diabetes mellitus with hyperglycemia: Secondary | ICD-10-CM | POA: Diagnosis present

## 2022-10-08 DIAGNOSIS — Z794 Long term (current) use of insulin: Secondary | ICD-10-CM | POA: Diagnosis not present

## 2022-10-08 DIAGNOSIS — R519 Headache, unspecified: Secondary | ICD-10-CM | POA: Diagnosis present

## 2022-10-08 DIAGNOSIS — I69354 Hemiplegia and hemiparesis following cerebral infarction affecting left non-dominant side: Secondary | ICD-10-CM | POA: Diagnosis not present

## 2022-10-08 DIAGNOSIS — L299 Pruritus, unspecified: Secondary | ICD-10-CM | POA: Diagnosis present

## 2022-10-08 DIAGNOSIS — Z6838 Body mass index (BMI) 38.0-38.9, adult: Secondary | ICD-10-CM | POA: Diagnosis not present

## 2022-10-08 DIAGNOSIS — I499 Cardiac arrhythmia, unspecified: Secondary | ICD-10-CM | POA: Diagnosis not present

## 2022-10-08 DIAGNOSIS — E669 Obesity, unspecified: Secondary | ICD-10-CM | POA: Diagnosis present

## 2022-10-08 DIAGNOSIS — I110 Hypertensive heart disease with heart failure: Secondary | ICD-10-CM | POA: Diagnosis present

## 2022-10-08 DIAGNOSIS — K219 Gastro-esophageal reflux disease without esophagitis: Secondary | ICD-10-CM | POA: Diagnosis present

## 2022-10-08 DIAGNOSIS — E785 Hyperlipidemia, unspecified: Secondary | ICD-10-CM | POA: Diagnosis present

## 2022-10-08 DIAGNOSIS — Z79899 Other long term (current) drug therapy: Secondary | ICD-10-CM | POA: Diagnosis not present

## 2022-10-08 DIAGNOSIS — I959 Hypotension, unspecified: Secondary | ICD-10-CM | POA: Diagnosis present

## 2022-10-08 DIAGNOSIS — I5032 Chronic diastolic (congestive) heart failure: Secondary | ICD-10-CM | POA: Diagnosis present

## 2022-10-08 DIAGNOSIS — R000 Tachycardia, unspecified: Secondary | ICD-10-CM | POA: Diagnosis present

## 2022-10-08 LAB — GLUCOSE, CAPILLARY
Glucose-Capillary: 136 mg/dL — ABNORMAL HIGH (ref 70–99)
Glucose-Capillary: 197 mg/dL — ABNORMAL HIGH (ref 70–99)
Glucose-Capillary: 119 mg/dL — ABNORMAL HIGH (ref 70–99)
Glucose-Capillary: 107 mg/dL — ABNORMAL HIGH (ref 70–99)
Glucose-Capillary: 105 mg/dL — ABNORMAL HIGH (ref 70–99)

## 2022-10-08 MED ORDER — ENOXAPARIN SODIUM 40 MG/0.4ML IJ SOSY
40.0000 mg | PREFILLED_SYRINGE | Freq: Every day | INTRAMUSCULAR | Status: DC
  Administered 2022-10-08: 40 mg via SUBCUTANEOUS
  Filled 2022-10-08: qty 0.4

## 2022-10-08 MED ORDER — SODIUM CHLORIDE 0.9 % WEIGHT BASED INFUSION: 1.0000 mL/kg/h | INTRAVENOUS | Status: DC

## 2022-10-08 MED ORDER — SODIUM CHLORIDE 0.9 % IV SOLN: INTRAVENOUS | Status: DC

## 2022-10-08 MED ORDER — METOPROLOL TARTRATE 50 MG PO TABS
50.0000 mg | ORAL_TABLET | Freq: Two times a day (BID) | ORAL | Status: DC
  Administered 2022-10-09: 50 mg via ORAL
  Filled 2022-10-08 (×3): qty 1

## 2022-10-08 MED ORDER — DIPHENHYDRAMINE HCL 50 MG/ML IJ SOLN: 50.0000 mg | Freq: Once | INTRAMUSCULAR | Status: DC

## 2022-10-08 MED ORDER — PREDNISONE 20 MG PO TABS
50.0000 mg | ORAL_TABLET | Freq: Four times a day (QID) | ORAL | Status: AC
  Administered 2022-10-08 – 2022-10-09 (×2): 50 mg via ORAL
  Filled 2022-10-08 (×3): qty 1

## 2022-10-08 MED ORDER — SODIUM CHLORIDE 0.9 % WEIGHT BASED INFUSION
3.0000 mL/kg/h | INTRAVENOUS | Status: AC
  Administered 2022-10-09: 3 mL/kg/h via INTRAVENOUS

## 2022-10-08 MED ORDER — SODIUM CHLORIDE 0.9 % IV SOLN: INTRAVENOUS | Status: AC

## 2022-10-08 MED ORDER — DIPHENHYDRAMINE HCL 25 MG PO CAPS
50.0000 mg | ORAL_CAPSULE | Freq: Once | ORAL | Status: DC
  Filled 2022-10-08: qty 2

## 2022-10-08 NOTE — Progress Notes (Signed)
PROGRESS NOTE    Lori [name]  FAO:[id number] DOB: Jul 08, 1970 DOA: 10/06/2022 PCP: Caesar Bookman, NP    Brief Narrative:   Lori [name] is a 52 y.o. female with past medical history significant for CAD s/p CABG, CVA with spastic hemiplegia, chronic diastolic congestive heart failure, essential hypertension, DM2, hypothyroidism, depression, anxiety, history of pheochromocytoma s/p adrenalectomy, chronic headache, reactive airway disease who presented to [facility] ED on 9/1 with chest pain.  Patient reports constant chest pressure to her substernal region lasting 5 hours with radiation to her neck.  Denies any palpitations, nausea or associated diaphoresis.  No similar episodes in the past.  Denies sick contacts, no recent changes in her home medications.  In the ED, temperature 98.4 F, HR 123, RR 22, BP 105/70, SpO2 99% on room air.  WBC 8.7, hemoglobin 12.2, platelet count 373.  Sodium 136, potassium 4.0, chloride 101, CO2 24, glucose 256, BUN 21, creatinine 0.92.  Troponin 5 followed by 6.  D-dimer less than 0.27.  TSH 0.816.  Influenza/COVID/RSV PCR negative.  Chest x-ray with no active cardiopulmonary disease process.  EKG with sinus tachycardia, HR 118, nonspecific T wave abnormality.  Patient received 1 L LR bolus and 1 g Tylenol.  TRH consulted for admission for further evaluation management of chest pain.  Assessment & Plan:   Chest pain Patient presenting to the ED with mid substernal chest pressure/pain lasting for 5 hours with radiation to her left neck.  Reports 5 out of 10 intensity without nausea or diaphoresis.  EKG with sinus tachycardia and nonspecific T wave abnormalities which is unchanged from previous EKG on 09/07/2022.  D-dimer within normal limits, troponin x 2 negative.  Chest x-ray with no acute abnormalities.  Patient is afebrile without leukocytosis.  TTE with LVEF 60 to 65%, no LV regional wall motion normalities, mild concentric LVH, no aortic  stenosis, trivial MR, IVC normal in size. -- Cardiology following, appreciate assistance --Heart catheterization planned today -- Monitor on telemetry  CAD s/p CABG -- Plavix 75 mg p.o. daily -- Atorvastatin 80 mg p.o. daily -- Zetia 10 mg p.o. daily  History of CVA with spastic hemiplegia Follows with PMR medicine outpatient. -- Baclofen 10 mg p.o. 3 times daily -- Flexeril 5 mg p.o. 3 times daily as needed muscle spasms -- Gabapentin 100 mg p.o. 3 times daily -- Resume home health orders with PT/OT/speech therapy/aide/RN on discharge  Chronic diastolic congestive heart failure Essential hypertension Previous TTE 01/2021 with LVEF 60 to 65%, moderate LVH, grade 1 diastolic dysfunction. -- Metoprolol tartrate decreased to 50 mg p.o. twice daily -- Holding home amlodipine, olmesartan-HCTZ given borderline hypotension -- Continue monitor BP closely  Type 2 diabetes mellitus with hyperglycemia Hemoglobin A1c 9.8 on 08/27/2022, poorly controlled. -- Semglee 40 units subcutaneously daily - -NovoLog 5 unit 3 times daily AC -- Resume semaglutide on discharge -- SSI for coverage -- CBGs qAC/HS  Hypothyroidism TSH 0.816, within normal limits.  Currently not on thyroid medication.  History of reactive airway disease -- Pulmicort neb twice daily -- Albuterol every 6 hours as needed wheezing/shortness of breath  Chronic headaches -- Topamax 25 mg p.o. twice daily  Anxiety/depression -- Lexapro 20 mg p.o. every morning -- Lorazepam 0.5 mg p.o. nightly  GERD: -- Continue PPI  History of pheochromocytoma s/p  adrenalectomy   DVT prophylaxis: rivaroxaban (XARELTO) tablet 10 mg Start: 10/07/22 1000 SCDs Start: 10/06/22 2005 rivaroxaban (XARELTO) tablet 10 mg    Code Status: Full Code  Family Communication: No family present at bedside this morning  Disposition Plan:  Level of care: Telemetry Cardiac Status is: Observation The patient remains OBS appropriate and will d/c  before 2 midnights.    Consultants:  Cardiology  Procedures:  TTE:  Heart catheterization: Pending  Antimicrobials:  None   Subjective: Patient seen examined bedside, resting comfortably.  Lying in bed.  Complaining of mild chest discomfort 4/10 overnight with radiation to her jaw, currently chest pain-free.  Discussed with cardiology PA this morning, plan heart catheterization today.  Patient with no other complaints or concerns at this time.  Denies headache, no dizziness, no chest pain currently, no shortness of breath, no abdominal pain, no fever.  No acute concerns overnight per nursing staff.    Objective: Vitals:   10/08/22 0527 10/08/22 0800 10/08/22 1011 10/08/22 1014  BP: 95/66 90/62 (!) 79/60 101/76  Pulse: 83 78 81 82  Resp: 16 19  14   Temp: (!) 97.3 F (36.3 C) 98.2 F (36.8 C)    TempSrc: Oral Oral    SpO2: 98% 98%  96%  Weight: 93.9 kg     Height:        Intake/Output Summary (Last 24 hours) at 10/08/2022 1203 Last data filed at 10/07/2022 2020 Gross per 24 hour  Intake 500 ml  Output 650 ml  Net -150 ml   Filed Weights   10/06/22 1435 10/07/22 0240 10/08/22 0527  Weight: 95.6 kg 91.6 kg 93.9 kg    Examination:  Physical Exam: GEN: NAD, alert and oriented x 3, obese, chronically ill appearance, appears older than stated age HEENT: NCAT, PERRL, EOMI, sclera clear, MMM PULM: CTAB w/o wheezes/crackles, normal respiratory effort, on room air CV: RRR w/o M/G/R GI: abd soft, NTND, NABS, no R/G/M MSK: no peripheral edema, spastic paresis bilateral lower extremities PSYCH: normal mood/affect Integumentary: dry/intact, no rashes or wounds    Data Reviewed: I have personally reviewed following labs and imaging studies  CBC: Recent Labs  Lab 10/06/22 1520 10/07/22 0315  WBC 8.7 8.3  HGB 12.2 11.3*  HCT 38.3 35.0*  MCV 78.6* 78.1*  PLT 373 333   Basic Metabolic Panel: Recent Labs  Lab 10/06/22 1520 10/07/22 0315  NA 136 136  K 4.0 3.8  CL  101 102  CO2 24 21*  GLUCOSE 256* 207*  BUN 21* 21*  CREATININE 0.92 0.94  CALCIUM 9.1 8.8*   GFR: Estimated Creatinine Clearance: 77.3 mL/min (by C-G formula based on SCr of 0.94 mg/dL). Liver Function Tests: Recent Labs  Lab 10/07/22 0315  AST 14*  ALT 17  ALKPHOS 80  BILITOT 0.4  PROT 6.1*  ALBUMIN 2.7*   No results for input(s): "LIPASE", "AMYLASE" in the last 168 hours. No results for input(s): "AMMONIA" in the last 168 hours. Coagulation Profile: No results for input(s): "INR", "PROTIME" in the last 168 hours. Cardiac Enzymes: No results for input(s): "CKTOTAL", "CKMB", "CKMBINDEX", "TROPONINI" in the last 168 hours. BNP (last 3 results) No results for input(s): "PROBNP" in the last 8760 hours. HbA1C: No results for input(s): "HGBA1C" in the last 72 hours. CBG: Recent Labs  Lab 10/06/22 2246 10/07/22 0023 10/07/22 0258 10/08/22 0758  GLUCAP 188* 197* 179* 136*   Lipid Profile: Recent Labs    10/07/22 0311  CHOL 132  HDL 35*  LDLCALC 66  TRIG 865*  CHOLHDL 3.8   Thyroid Function Tests: Recent Labs    10/06/22 1649  TSH 0.816   Anemia Panel: No results for input(s): "  VITAMINB12", "FOLATE", "FERRITIN", "TIBC", "IRON", "RETICCTPCT" in the last 72 hours. Sepsis Labs: No results for input(s): "PROCALCITON", "LATICACIDVEN" in the last 168 hours.  Recent Results (from the past 240 hour(s))  Resp panel by RT-PCR (RSV, Flu A&B, Covid) Anterior Nasal Swab     Status: None   Collection Time: 10/06/22  4:27 PM   Specimen: Anterior Nasal Swab  Result Value Ref Range Status   SARS Coronavirus 2 by RT PCR NEGATIVE NEGATIVE Final   Influenza A by PCR NEGATIVE NEGATIVE Final   Influenza B by PCR NEGATIVE NEGATIVE Final    Comment: (NOTE) The Xpert Xpress SARS-CoV-2/FLU/RSV plus assay is intended as an aid in the diagnosis of influenza from Nasopharyngeal swab specimens and should not be used as a sole basis for treatment. Nasal washings and aspirates are  unacceptable for Xpert Xpress SARS-CoV-2/FLU/RSV testing.  Fact Sheet for Patients: BloggerCourse.com  Fact Sheet for Healthcare Providers: SeriousBroker.it  This test is not yet approved or cleared by the [place] FDA and has been authorized for detection and/or diagnosis of SARS-CoV-2 by FDA under an Emergency Use Authorization (EUA). This EUA will remain in effect (meaning this test can be used) for the duration of the COVID-19 declaration under Section 564(b)(1) of the Act, 21 U.S.C. section 360bbb-3(b)(1), unless the authorization is terminated or revoked.     Resp Syncytial Virus by PCR NEGATIVE NEGATIVE Final    Comment: (NOTE) Fact Sheet for Patients: BloggerCourse.com  Fact Sheet for Healthcare Providers: SeriousBroker.it  This test is not yet approved or cleared by the [place] FDA and has been authorized for detection and/or diagnosis of SARS-CoV-2 by FDA under an Emergency Use Authorization (EUA). This EUA will remain in effect (meaning this test can be used) for the duration of the COVID-19 declaration under Section 564(b)(1) of the Act, 21 U.S.C. section 360bbb-3(b)(1), unless the authorization is terminated or revoked.  Performed at [facility], [address] [place], [place] [address]          Radiology Studies: ECHOCARDIOGRAM COMPLETE  Result Date: 10/07/2022    ECHOCARDIOGRAM REPORT   Patient Name:   BRIANN LEGER Date of Exam: 10/07/2022 Medical Rec #:  [id number]           Height:       62.0 in Accession #:    [id number]          Weight:       201.9 lb Date of Birth:  05-22-1970            BSA:          1.920 m Patient Age:    51 years            BP:           84/63 mmHg Patient Gender: F                   HR:           86 bpm. Exam Location:  Inpatient Procedure: 2D Echo, Color Doppler, Cardiac Doppler and Intracardiac             Opacification Agent Indications:    Chest Pain  History:        Patient has prior history of Echocardiogram examinations, most                 recent 01/21/2021. CAD, Stroke, Signs/Symptoms:Chest Pain; Risk  Factors:Diabetes, Hypertension and Dyslipidemia.  Sonographer:    Milbert Coulter Referring Phys: [id number] SUBRINA SUNDIL  Sonographer Comments: Technically difficult study due to poor echo windows, suboptimal apical window, suboptimal subcostal window and patient is obese. Image acquisition challenging due to patient body habitus and patient could not tollerate much pressure in the apical windows due to broken skin and rash. IMPRESSIONS  1. Left ventricular ejection fraction, by estimation, is 60 to 65%. The left ventricle has normal function. The left ventricle has no regional wall motion abnormalities. There is mild concentric left ventricular hypertrophy. Left ventricular diastolic parameters were normal.  2. Right ventricular systolic function is normal. The right ventricular size is normal. There is normal pulmonary artery systolic pressure. The estimated right ventricular systolic pressure is 21.0 mmHg.  3. The mitral valve is grossly normal. Trivial mitral valve regurgitation.  4. The aortic valve is tricuspid. There is mild calcification of the aortic valve. Aortic valve regurgitation is not visualized. Aortic valve sclerosis/calcification is present, without any evidence of aortic stenosis. Aortic valve mean gradient measures 4.0 mmHg.  5. The inferior vena cava is normal in size with greater than 50% respiratory variability, suggesting right atrial pressure of 3 mmHg.  6. Cannot exclude a small PFO. Comparison(s): Prior images reviewed side by side. LVEF 60-65%. Normal estimated RVSP. Cannot exclude small PFO. FINDINGS  Left Ventricle: Left ventricular ejection fraction, by estimation, is 60 to 65%. The left ventricle has normal function. The left ventricle has no regional wall motion  abnormalities. Definity contrast agent was given IV to delineate the left ventricular  endocardial borders. The left ventricular internal cavity size was normal in size. There is mild concentric left ventricular hypertrophy. Left ventricular diastolic parameters were normal. Right Ventricle: The right ventricular size is normal. No increase in right ventricular wall thickness. Right ventricular systolic function is normal. There is normal pulmonary artery systolic pressure. The tricuspid regurgitant velocity is 2.12 m/s, and  with an assumed right atrial pressure of 3 mmHg, the estimated right ventricular systolic pressure is 21.0 mmHg. Left Atrium: Left atrial size was normal in size. Right Atrium: Right atrial size was normal in size. Pericardium: Trivial pericardial effusion is present. The pericardial effusion is posterior to the left ventricle. Presence of epicardial fat layer. Mitral Valve: The mitral valve is grossly normal. Trivial mitral valve regurgitation. Tricuspid Valve: The tricuspid valve is grossly normal. Tricuspid valve regurgitation is mild. Aortic Valve: The aortic valve is tricuspid. There is mild calcification of the aortic valve. Aortic valve regurgitation is not visualized. Aortic valve sclerosis/calcification is present, without any evidence of aortic stenosis. Aortic valve mean gradient measures 4.0 mmHg. Aortic valve peak gradient measures 8.3 mmHg. Aortic valve area, by VTI measures 1.58 cm. Pulmonic Valve: The pulmonic valve was grossly normal. Pulmonic valve regurgitation is trivial. Aorta: The aortic root and ascending aorta are structurally normal, with no evidence of dilitation. Venous: The inferior vena cava is normal in size with greater than 50% respiratory variability, suggesting right atrial pressure of 3 mmHg. IAS/Shunts: Cannot exclude a small PFO.  LEFT VENTRICLE PLAX 2D LVIDd:         3.50 cm   Diastology LVIDs:         2.10 cm   LV e' medial:    6.74 cm/s LV PW:          1.20 cm   LV E/e' medial:  8.9 LV IVS:        1.00 cm   LV e'  lateral:   8.27 cm/s LVOT diam:     1.90 cm   LV E/e' lateral: 7.2 LV SV:         40 LV SV Index:   21 LVOT Area:     2.84 cm  LEFT ATRIUM         Index LA diam:    2.90 cm 1.51 cm/m  AORTIC VALVE AV Area (Vmax):    1.51 cm AV Area (Vmean):   1.50 cm AV Area (VTI):     1.58 cm AV Vmax:           144.00 cm/s AV Vmean:          97.000 cm/s AV VTI:            0.253 m AV Peak Grad:      8.3 mmHg AV Mean Grad:      4.0 mmHg LVOT Vmax:         76.50 cm/s LVOT Vmean:        51.400 cm/s LVOT VTI:          0.141 m LVOT/AV VTI ratio: 0.56  AORTA Ao Root diam: 2.90 cm Ao Asc diam:  2.80 cm MITRAL VALVE               TRICUSPID VALVE MV Area (PHT): 3.50 cm    TR Peak grad:   18.0 mmHg MV Decel Time: 217 msec    TR Vmax:        212.00 cm/s MV E velocity: 59.70 cm/s MV A velocity: 58.70 cm/s  SHUNTS MV E/A ratio:  1.02        Systemic VTI:  0.14 m                            Systemic Diam: 1.90 cm Nona Dell MD Electronically signed by Nona Dell MD Signature Date/Time: 10/07/2022/4:47:43 PM    Final    DG Chest 2 View  Result Date: 10/06/2022 CLINICAL DATA:  Left-sided chest pain radiates to the neck. EXAM: CHEST - 2 VIEW COMPARISON:  09/06/2022 FINDINGS: The lungs are clear without focal pneumonia, edema, pneumothorax or pleural effusion. The cardiopericardial silhouette is within normal limits for size. Status post CABG. IMPRESSION: No active cardiopulmonary disease. Electronically Signed   By: Kennith Center M.D.   On: 10/06/2022 17:51        Scheduled Meds:  atorvastatin  80 mg Oral Daily   baclofen  10 mg Oral TID   budesonide  0.25 mg Nebulization BID   clopidogrel  75 mg Oral Daily   escitalopram  20 mg Oral q morning   ezetimibe  10 mg Oral Daily   gabapentin  100 mg Oral TID   insulin aspart  0-6 Units Subcutaneous TID WC   insulin aspart  5 Units Subcutaneous TID WC   insulin glargine-yfgn  40 Units Subcutaneous QHS   LORazepam   0.5 mg Oral QHS   metoprolol tartrate  50 mg Oral BID   pantoprazole  40 mg Oral Daily   polyethylene glycol  17 g Oral Daily   rivaroxaban  10 mg Oral Daily   sodium chloride flush  3 mL Intravenous Q12H   topiramate  25 mg Oral BID   Continuous Infusions:  sodium chloride     sodium chloride     sodium chloride       LOS: 0 days    Time spent: 50 minutes spent  on chart review, discussion with nursing staff, consultants, updating family and interview/physical exam; more than 50% of that time was spent in counseling and/or coordination of care.    Alvira Philips [place], DO [facility] Available via Epic secure chat 7am-7pm After these hours, please refer to coverage provider listed on [email or web address] 10/08/2022, 12:03 PM

## 2022-10-08 NOTE — Progress Notes (Addendum)
Patient Name: Lori [name] Date of Encounter: 10/08/2022 [facility] Cardiologist: Orbie Pyo, MD   Interval Summary  .    Patient admitted on 9/1 with atypical chest pain. Work up to this point has been reassuring with negative HS troponin, no acute ischemic changes on ECG, stable TTE.  Today patient reports some overnight chest tightness, rated 4/10 with radiation to neck. Denies associated dyspnea though did have a cough with her pain. She is currently without any symptoms of chest discomfort this morning.   Vital Signs .    Vitals:   10/07/22 1647 10/07/22 1945 10/07/22 2024 10/08/22 0527  BP: 106/74 113/80  95/66  Pulse:  80  83  Resp:  19  16  Temp: 98.5 F (36.9 C) (!) 97.5 F (36.4 C)  (!) 97.3 F (36.3 C)  TempSrc: Oral Oral  Oral  SpO2:  98% 98% 98%  Weight:    93.9 kg  Height:        Intake/Output Summary (Last 24 hours) at 10/08/2022 0739 Last data filed at 10/07/2022 2020 Gross per 24 hour  Intake 1274 ml  Output 1250 ml  Net 24 ml      10/08/2022    5:27 AM 10/07/2022    2:40 AM 10/06/2022    2:35 PM  Last 3 Weights  Weight (lbs) 207 lb 0.2 oz 201 lb 15.1 oz 210 lb 12.2 oz  Weight (kg) 93.9 kg 91.6 kg 95.6 kg      Telemetry/ECG    Sinus rhythm - Personally Reviewed  Physical Exam .   GEN: No acute distress.   Neck: No JVD Cardiac: RRR, no murmurs, rubs, or gallops.  Respiratory: Clear to auscultation bilaterally. GI: Soft, nontender, non-distended  MS: No edema  Assessment & Plan .     Atypical chest pain Hx CABG  Patient s/p 3V CABG in 2018 at [facility] in [place], [place]. She presented to the hospital after having onset of chest pain after eating/while at rest. She did not have radiation of pain or significant dyspnea. HS troponin negative and ECG without acute changes to indicate active ischemia (just previously noted non-specific TWI).   TTE without RWA or evidence of cardiomyopathy. Although work up this admission does  not point to acute ischemia, given recurrent chest pain overnight and patient's DM status, atypical symptoms do not rule out progressive CAD as cause of symptoms. Stress test may not be diagnostic given high probability of balanced disease. Tentative plans for [facility] today, will review with Dr. Servando Salina. Could consider Ranexa for anti-anginal therapy without BP effect given hypotension this admission. Pre-cath fluids ordered  Informed Consent   Shared Decision Making/Informed Consent The risks [stroke (1 in 1000), death (1 in 1000), kidney failure [usually temporary] (1 in 500), bleeding (1 in 200), allergic reaction [possibly serious] (1 in 200)], benefits (diagnostic support and management of coronary artery disease) and alternatives of a cardiac catheterization were discussed in detail with Lori [name] and she is willing to proceed.      Chronic diastolic CHF  TTE this admission with LVEF 60-65% with no RWA and normal diastolic parameters.   Low BP limits GDMT. Stop Amlodipine 5mg . Decrease Metoprolol Tartrate to 50mg  BID. Continue holding home Olmesartan-HCTZ  Hypertension  Patient with history of difficult to control BP and has been seen by Dr. Duke Salvia in HTN clinic in the past. BP this admission has remained low/low normal.   BP meds as above.  Hyperlipidemia  LDL  66 on max dose statin and Zetia. With her CVA and CABG history, LDL goal <55. May need lipid clinic referral.  Lab Results  Component Value Date   CHOL 132 10/07/2022   HDL 35 (L) 10/07/2022   LDLCALC 66 10/07/2022   TRIG 157 (H) 10/07/2022   CHOLHDL 3.8 10/07/2022   Per primary team: Hx CVA DM type II Hypothyroidism Anxiety/depression   For questions or updates, please contact [facility] Please consult [email or web address] for contact info under   Signed, Perlie Gold, PA-C   Patient seen and examined, note reviewed with the signed Advanced Practice Provider. I personally reviewed laboratory data,  imaging studies and relevant notes. I independently examined the patient and formulated the important aspects of the plan. I have personally discussed the plan with the patient and/or family. Comments or changes to the note/plan are indicated below.  Chest pain he states typical but suspicious given her high risk for progression of coronary artery disease I like to proceed with a left heart catheterization in this patient. She is agreeable to do so.  The patient understands that risks include but are not limited to stroke (1 in 1000), death (1 in 1000), kidney failure [usually temporary] (1 in 500), bleeding (1 in 200), allergic reaction [possibly serious] (1 in 200), and agrees to proceed.  LDL goal less than 55, therefore she is not at goal.  She is on combination therapy.  She would benefit from PCSK9 inhibitor.  With her elevated triglyceride will also consider Vascepa.   Thomasene Ripple DO, MS [facility] Attending Cardiologist [facility]  [address] [place], [place] [address] [phone] Website: [email or web address]

## 2022-10-08 NOTE — TOC Initial Note (Signed)
Transition of Care [facility]) - Initial/Assessment Note    Patient Details  Name: Lori [name] MRN: [id number] Date of Birth: [birth year]  [facility]) CM/SW Contact:    Lawerance Sabal, RN Phone Number: 10/08/2022, 3:23 PM  Clinical Narrative:                  Sherron Monday w patient at bedside.  She is from home w spouse. Patient has PCS aid 7 days a week, [employer], who has been notified of placement in Obs.  Patient has needed DME at home hospital bed, BSC WC.  Per progression report and patient, awaiting cath.  PTAR forms on chart. Anticipate she may go home tomorrow (forms will need updated w correct date)   Expected Discharge Plan: Home w Home Health Services Barriers to Discharge: Continued Medical Work up   Patient Goals and CMS Choice Patient states their goals for this hospitalization and ongoing recovery are:: return home          Expected Discharge Plan and Services   Discharge Planning Services: CM Consult   Living arrangements for the past 2 months: Apartment                 DME Arranged: N/A             Date HH Agency Contacted: 10/08/22 Time HH Agency Contacted: 1523 Representative spoke with at [facility] Agency: Kandee Keen  Prior Living Arrangements/Services Living arrangements for the past 2 months: Apartment Lives with:: Spouse                   Activities of Daily Living Home Assistive Devices/Equipment: Art gallery manager, Wheelchair ADL Screening (condition at time of admission) Patient's cognitive ability adequate to safely complete daily activities?: Yes Is the patient deaf or have difficulty hearing?: No Does the patient have difficulty seeing, even when wearing glasses/contacts?: No Does the patient have difficulty concentrating, remembering, or making decisions?: No Patient able to express need for assistance with ADLs?: Yes Does the patient have difficulty dressing or bathing?: Yes Independently performs ADLs?: No Communication:  Independent Dressing (OT): Needs assistance, Dependent Is this a change from baseline?: Pre-admission baseline Grooming: Needs assistance, Dependent Is this a change from baseline?: Pre-admission baseline Feeding: Independent Bathing: Needs assistance, Dependent Is this a change from baseline?: Pre-admission baseline Toileting: Needs assistance, Dependent Is this a change from baseline?: Pre-admission baseline In/Out Bed: Needs assistance, Dependent Is this a change from baseline?: Pre-admission baseline Walks in Home: Dependent (unable-pt is wheelchair bound) Is this a change from baseline?: Pre-admission baseline Does the patient have difficulty walking or climbing stairs?: Yes Weakness of Legs: Both Weakness of Arms/Hands: Both  Permission Sought/Granted                  Emotional Assessment              Admission diagnosis:  Chest pain [R07.9] Chest pain, unspecified type [R07.9] Patient Active Problem List   Diagnosis Date Noted   Chest pain 10/06/2022   History of CAD (coronary artery disease) 10/06/2022   GAD (generalized anxiety disorder) 10/06/2022   Reactive airway disease 10/06/2022   UTI (urinary tract infection) 07/04/2022   Mild intermittent asthma, uncomplicated 07/04/2022   Lethargy 05/01/2022   Hyponatremia 04/30/2022   Hyperkalemia 04/30/2022   AKI (acute kidney injury) (HCC) 04/30/2022   Wheelchair dependence 04/08/2022   Spasticity as late effect of cerebrovascular accident (CVA) 04/08/2022   Left hemiplegia (HCC) 04/08/2022   Dyspnea 11/02/2021  GERD (gastroesophageal reflux disease) 11/02/2021   Acute bronchitis 08/24/2021   Pseudohyponatremia 07/10/2021   Hypothyroidism 07/10/2021   Functional urinary incontinence 05/17/2021   MDD (major depressive disorder), recurrent episode, severe (HCC) 04/09/2021   Diabetic gastroparesis (HCC) 03/16/2021   Migraines 03/16/2021   History of cerebral infarction    Debility 02/26/2021   DKA  (diabetic ketoacidosis) (HCC) 02/19/2021   Weakness    Insulin dependent type 2 diabetes mellitus (HCC)    Aphasia    Spell of abnormal behavior    History of CVA with spastic paresis (cerebrovascular accident) 01/19/2021   Tachycardia 01/19/2021   CAD (coronary artery disease) 01/19/2021   Spastic hemiparesis of left 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 (HCC) 01/19/2021   Diabetes (HCC)    Hyperthyroidism    Essential hypertension    Pheochromocytoma    Anxiety and depression    HLD (hyperlipidemia)    PCP:  Caesar Bookman, NP Pharmacy:   [facility] [place] - [address] [place] [address] Phone: [phone] Fax: [phone]  [facility] - [place], [place] - [address] [place] [address] Phone: [phone] Fax: [phone]  [id number] - [place], [place] - [address] AT [name] [address] [facility] [address] [place] [address] Phone: [phone] Fax: [phone]     Social Determinants of Health (SDOH) Social History: SDOH Screenings   Food Insecurity: No Food Insecurity (10/07/2022)  Housing: Low Risk  (10/07/2022)  Transportation Needs: No Transportation Needs (10/07/2022)  Utilities: Not At Risk (10/07/2022)  Depression (PHQ2-9): Low Risk  (09/30/2022)  Tobacco Use: Low Risk  (10/06/2022)   SDOH Interventions:     Readmission Risk Interventions    08/29/2021    2:32 PM 07/18/2021   12:43 PM 07/11/2021    1:22 PM  Readmission Risk Prevention Plan  Transportation Screening Complete Complete Complete  Medication Review Oceanographer) Complete Complete Complete  PCP or Specialist appointment within 3-5 days of discharge Complete Complete   HRI or Home Care Consult Complete Complete Complete  SW Recovery Care/Counseling Consult Complete Complete Complete  Palliative Care Screening Not Applicable Not  Applicable Not Applicable  Skilled Nursing Facility Complete Complete Complete

## 2022-10-09 ENCOUNTER — Telehealth: Payer: Self-pay

## 2022-10-09 ENCOUNTER — Other Ambulatory Visit: Payer: Self-pay | Admitting: Cardiology

## 2022-10-09 DIAGNOSIS — R072 Precordial pain: Secondary | ICD-10-CM

## 2022-10-09 DIAGNOSIS — R079 Chest pain, unspecified: Secondary | ICD-10-CM | POA: Diagnosis not present

## 2022-10-09 LAB — CBC
HCT: 35.6 % — ABNORMAL LOW (ref 36.0–46.0)
Hemoglobin: 11.4 g/dL — ABNORMAL LOW (ref 12.0–15.0)
MCH: 25.1 pg — ABNORMAL LOW (ref 26.0–34.0)
MCHC: 32 g/dL (ref 30.0–36.0)
MCV: 78.2 fL — ABNORMAL LOW (ref 80.0–100.0)
Platelets: 321 10*3/uL (ref 150–400)
RBC: 4.55 MIL/uL (ref 3.87–5.11)
RDW: 17.1 % — ABNORMAL HIGH (ref 11.5–15.5)
WBC: 8.6 10*3/uL (ref 4.0–10.5)
nRBC: 0 % (ref 0.0–0.2)

## 2022-10-09 LAB — BASIC METABOLIC PANEL
Anion gap: 12 (ref 5–15)
BUN: 23 mg/dL — ABNORMAL HIGH (ref 6–20)
CO2: 20 mmol/L — ABNORMAL LOW (ref 22–32)
Calcium: 8.9 mg/dL (ref 8.9–10.3)
Chloride: 102 mmol/L (ref 98–111)
Creatinine, Ser: 1.16 mg/dL — ABNORMAL HIGH (ref 0.44–1.00)
GFR, Estimated: 58 mL/min — ABNORMAL LOW (ref >60)
Glucose, Bld: 293 mg/dL — ABNORMAL HIGH (ref 70–99)
Potassium: 4.3 mmol/L (ref 3.5–5.1)
Sodium: 134 mmol/L — ABNORMAL LOW (ref 135–145)

## 2022-10-09 LAB — GLUCOSE, CAPILLARY: Glucose-Capillary: 329 mg/dL — ABNORMAL HIGH (ref 70–99)

## 2022-10-09 SURGERY — LEFT HEART CATH AND CORONARY ANGIOGRAPHY
Anesthesia: LOCAL

## 2022-10-09 MED ORDER — PREDNISONE 20 MG PO TABS: 50.0000 mg | ORAL_TABLET | Freq: Once | ORAL | Status: DC

## 2022-10-09 MED ORDER — RANOLAZINE ER 500 MG PO TB12
500.0000 mg | ORAL_TABLET | Freq: Two times a day (BID) | ORAL | Status: DC
  Administered 2022-10-09: 500 mg via ORAL
  Filled 2022-10-09: qty 1

## 2022-10-09 MED ORDER — RANOLAZINE ER 500 MG PO TB12: 500.0000 mg | ORAL_TABLET | Freq: Two times a day (BID) | ORAL | 0 refills | Status: DC

## 2022-10-09 NOTE — Discharge Summary (Signed)
Physician Discharge Summary  Lori [name] Becton NWG:[id number] DOB: 02/01/1971 DOA: 10/06/2022  PCP: Caesar Bookman, NP  Admit date: 10/06/2022 Discharge date: 10/09/2022  Admitted From: Home Disposition: Home  Recommendations for Outpatient Follow-up:  Follow up with PCP in 1-2 weeks Follow-up with cardiology Started on Ranexa 500 mg p.o. twice daily Discontinued amlodipine and Benicar due to hypotension  Home Health: No Equipment/Devices: None  Discharge Condition: Stable CODE STATUS: Full code Diet recommendation: Heart healthy diet  History of present illness:  Lori [name] is a 52 y.o. female with past medical history significant for CAD s/p CABG, CVA with spastic hemiplegia, chronic diastolic congestive heart failure, essential hypertension, DM2, hypothyroidism, depression, anxiety, history of pheochromocytoma s/p adrenalectomy, chronic headache, reactive airway disease who presented to [facility] ED on 9/1 with chest pain.  Patient reports constant chest pressure to her substernal region lasting 5 hours with radiation to her neck.  Denies any palpitations, nausea or associated diaphoresis.  No similar episodes in the past.  Denies sick contacts, no recent changes in her home medications.   In the ED, temperature 98.4 F, HR 123, RR 22, BP 105/70, SpO2 99% on room air.  WBC 8.7, hemoglobin 12.2, platelet count 373.  Sodium 136, potassium 4.0, chloride 101, CO2 24, glucose 256, BUN 21, creatinine 0.92.  Troponin 5 followed by 6.  D-dimer less than 0.27.  TSH 0.816.  Influenza/COVID/RSV PCR negative.  Chest x-ray with no active cardiopulmonary disease process.  EKG with sinus tachycardia, HR 118, nonspecific T wave abnormality.  Patient received 1 L LR bolus and 1 g Tylenol.  TRH consulted for admission for further evaluation management of chest pain.  Hospital course:  Chest pain Patient presenting to the ED with mid substernal chest pressure/pain lasting for 5 hours  with radiation to her left neck.  Reports 5 out of 10 intensity without nausea or diaphoresis.  EKG with sinus tachycardia and nonspecific T wave abnormalities which is unchanged from previous EKG on 09/07/2022.  D-dimer within normal limits, troponin x 2 negative.  Chest x-ray with no acute abnormalities.  Patient is afebrile without leukocytosis.  TTE with LVEF 60 to 65%, no LV regional wall motion normalities, mild concentric LVH, no aortic stenosis, trivial MR, IVC normal in size.  Cardiology was consulted and followed during hospital course.  Initial plan for heart catheterization now discontinued given patient's declining procedure and patient's primary cardiologist Dr. Lynnette Caffey did not think this was necessary at this juncture with plan outpatient stress test.  Patient was started on Ranexa 500 mg p.o. twice daily.  Outpatient follow-up with cardiology.   CAD s/p CABG Continue Plavix 75 mg p.o. daily, Atorvastatin 80 mg p.o. daily, Zetia 10 mg p.o. daily   History of CVA with spastic hemiplegia Follows with PMR medicine outpatient.  Continue home Baclofen 10 mg p.o. 3 times daily, Flexeril 5 mg p.o. 3 times daily as needed muscle spasms, Gabapentin 100 mg p.o. 3 times daily   Chronic diastolic congestive heart failure Essential hypertension Previous TTE 01/2021 with LVEF 60 to 65%, moderate LVH, grade 1 diastolic dysfunction. Metoprolol tartrate 100 mg p.o. twice daily, started on Ranexa as above.  Discontinued amlodipine and olmesartan-HCTZ given hypotension.  Outpatient follow-up with cardiology/hypertension clinic.   Type 2 diabetes mellitus with hyperglycemia Hemoglobin A1c 9.8 on 08/27/2022, poorly controlled.  Continue home Lantus 40 units subcutaneously daily, Humalog 15 units 3 times daily AC, semaglutide.  Outpatient follow-up with PCP.   Hypothyroidism TSH 0.816, within  normal limits.  Currently not on thyroid medication.   History of reactive airway disease Pulmicort neb twice  daily   Chronic headaches Topamax 25 mg p.o. twice daily   Anxiety/depression Lexapro 20 mg p.o. every morning, Lorazepam 0.5 mg p.o. nightly   GERD: Continue PPI   History of pheochromocytoma s/p  adrenalectomy  Discharge Diagnoses:  Principal Problem:   Chest pain Active Problems:   Insulin dependent type 2 diabetes mellitus (HCC)   Essential hypertension   History of CVA with spastic paresis (cerebrovascular accident)   Hypothyroidism   History of CAD (coronary artery disease)   GAD (generalized anxiety disorder)   Reactive airway disease    Discharge Instructions  Discharge Instructions     Call MD for:  difficulty breathing, headache or visual disturbances   Complete by: As directed    Call MD for:  extreme fatigue   Complete by: As directed    Call MD for:  persistant dizziness or light-headedness   Complete by: As directed    Call MD for:  persistant nausea and vomiting   Complete by: As directed    Call MD for:  severe uncontrolled pain   Complete by: As directed    Call MD for:  temperature >100.4   Complete by: As directed    Diet - low sodium heart healthy   Complete by: As directed    Increase activity slowly   Complete by: As directed       Allergies as of 10/09/2022       Reactions   Contrast Media [iodinated Contrast Media] Anaphylaxis, Swelling   Penicillins Swelling   Mouth swells up and eyes swollen shut   Sumatriptan Anaphylaxis   Vancomycin Other (See Comments)   Red Man's Syndrome   Aspirin Swelling   Ciprofloxacin    Feel nausea/vomitting   Morphine Swelling        Medication List     STOP taking these medications    amLODipine 5 MG tablet Commonly known as: NORVASC   doxycycline 100 MG tablet Commonly known as: VIBRA-TABS   olmesartan-hydrochlorothiazide 40-25 MG tablet Commonly known as: BENICAR HCT       TAKE these medications    albuterol (2.5 MG/3ML) 0.083% nebulizer solution Commonly known as:  PROVENTIL Take 3 mLs (2.5 mg total) by nebulization every 6 (six) hours as needed for wheezing or shortness of breath.   albuterol 108 (90 Base) MCG/ACT inhaler Commonly known as: VENTOLIN HFA INHALE 2 PUFFS BY MOUTH EVERY 6 HOURS AS NEEDED FOR WHEEZING AND FOR SHORTNESS OF BREATH   atorvastatin 80 MG tablet Commonly known as: LIPITOR Take 1 tablet (80 mg total) by mouth daily.   baclofen 10 MG tablet Commonly known as: LIORESAL Take 1 tablet (10 mg total) by mouth 3 (three) times daily. X 1 week, then 10 mg TID-for spasticity- can increase  the dose if you call me to discuss   blood glucose meter kit and supplies 1 each by Other route as directed. Dispense based on patient and insurance preference. Use up to four times daily as directed. (FOR ICD-10 E10.9, E11.9).   budesonide 0.25 MG/2ML nebulizer solution Commonly known as: PULMICORT Take 2 mLs (0.25 mg total) by nebulization 2 (two) times daily.   clopidogrel 75 MG tablet Commonly known as: PLAVIX Take 1 tablet (75 mg total) by mouth daily.   cyclobenzaprine 5 MG tablet Commonly known as: FLEXERIL Take 1 tablet (5 mg total) by mouth 3 (three) times daily as  needed for muscle spasms.   diclofenac Sodium 1 % Gel Commonly known as: VOLTAREN Apply 4 g topically 4 (four) times daily.   escitalopram 20 MG tablet Commonly known as: LEXAPRO Take 1 tablet (20 mg total) by mouth every morning.   ezetimibe 10 MG tablet Commonly known as: Zetia Take 1 tablet (10 mg total) by mouth daily.   FreeStyle Libre 14 Day Sensor Misc 1 each by Does not apply route as needed. DX: E11.69   gabapentin 100 MG capsule Commonly known as: NEURONTIN Take 1 capsule (100 mg total) by mouth 3 (three) times daily.   insulin lispro 100 UNIT/ML KwikPen Commonly known as: HUMALOG Inject 15 Units into the skin 3 (three) times daily with meals. For blood sugars > 150   Insulin Pen Needle 32G X 4 MM Misc Use to inject insulin 4 times daily as  directed.   Klayesta powder Generic drug: nystatin Apply 1 Application topically 2 (two) times daily. Affected areas on breast fold, groin, and sacral areas.   Lantus SoloStar 100 UNIT/ML Solostar Pen Generic drug: insulin glargine INJECT 40 UNITS SUBCUTANEOUSLY AT BEDTIME   LORazepam 0.5 MG tablet Commonly known as: ATIVAN Take 1 tablet (0.5 mg total) by mouth at bedtime.   methimazole 5 MG tablet Commonly known as: TAPAZOLE Take 1 tablet (5 mg total) by mouth every morning.   metoprolol tartrate 100 MG tablet Commonly known as: LOPRESSOR TAKE 1 TABLET BY MOUTH TWICE DAILY (BREAKFAST, BEDTIME)   omeprazole 20 MG capsule Commonly known as: PRILOSEC Take 1 capsule (20 mg total) by mouth daily.   polyethylene glycol powder 17 GM/SCOOP powder Commonly known as: GLYCOLAX/MIRALAX Take 17 g by mouth daily. Hold for loose stool   ranolazine 500 MG 12 hr tablet Commonly known as: RANEXA Take 1 tablet (500 mg total) by mouth 2 (two) times daily.   rivaroxaban 10 MG Tabs tablet Commonly known as: XARELTO Take 1 tablet (10 mg total) by mouth daily.   Semaglutide(0.25 or 0.5MG /DOS) 2 MG/3ML Sopn Inject 0.5 mg into the skin once a week.   tamsulosin 0.4 MG Caps capsule Commonly known as: FLOMAX Take 1 capsule (0.4 mg total) by mouth at bedtime.   topiramate 25 MG tablet Commonly known as: TOPAMAX Take 1 tablet (25 mg total) by mouth 2 (two) times daily.   triamcinolone ointment 0.5 % Commonly known as: KENALOG Apply 1 Application topically 2 (two) times daily.   zinc oxide 11.3 % Crea cream Commonly known as: BALMEX Apply 1 Application topically 2 (two) times daily.        Follow-up Information     Ngetich, Dinah C, NP. Schedule an appointment as soon as possible for a visit in 1 week(s).   Specialty: Family Medicine Contact information: [address] [place] [address] [phone]         Orbie Pyo, MD. Schedule an appointment as soon as possible  for a visit.   Specialty: Cardiology Contact information: [address] [place] [address] [phone]                Allergies  Allergen Reactions   Contrast Media [Iodinated Contrast Media] Anaphylaxis and Swelling   Penicillins Swelling    Mouth swells up and eyes swollen shut   Sumatriptan Anaphylaxis   Vancomycin Other (See Comments)    Red Man's Syndrome   Aspirin Swelling   Ciprofloxacin     Feel nausea/vomitting   Morphine Swelling    Consultations:  Cardiology   Procedures/Studies: ECHOCARDIOGRAM COMPLETE  Result Date: 10/07/2022    ECHOCARDIOGRAM REPORT   Patient Name:   CANNA SWADLEY Date of Exam: 10/07/2022 Medical Rec #:  [id number]           Height:       62.0 in Accession #:    [id number]          Weight:       201.9 lb Date of Birth:  07-13-1970            BSA:          1.920 m Patient Age:    51 years            BP:           84/63 mmHg Patient Gender: F                   HR:           86 bpm. Exam Location:  Inpatient Procedure: 2D Echo, Color Doppler, Cardiac Doppler and Intracardiac            Opacification Agent Indications:    Chest Pain  History:        Patient has prior history of Echocardiogram examinations, most                 recent 01/21/2021. CAD, Stroke, Signs/Symptoms:Chest Pain; Risk                 Factors:Diabetes, Hypertension and Dyslipidemia.  Sonographer:    Milbert Coulter Referring Phys: [id number] SUBRINA SUNDIL  Sonographer Comments: Technically difficult study due to poor echo windows, suboptimal apical window, suboptimal subcostal window and patient is obese. Image acquisition challenging due to patient body habitus and patient could not tollerate much pressure in the apical windows due to broken skin and rash. IMPRESSIONS  1. Left ventricular ejection fraction, by estimation, is 60 to 65%. The left ventricle has normal function. The left ventricle has no regional wall motion abnormalities. There is mild concentric left  ventricular hypertrophy. Left ventricular diastolic parameters were normal.  2. Right ventricular systolic function is normal. The right ventricular size is normal. There is normal pulmonary artery systolic pressure. The estimated right ventricular systolic pressure is 21.0 mmHg.  3. The mitral valve is grossly normal. Trivial mitral valve regurgitation.  4. The aortic valve is tricuspid. There is mild calcification of the aortic valve. Aortic valve regurgitation is not visualized. Aortic valve sclerosis/calcification is present, without any evidence of aortic stenosis. Aortic valve mean gradient measures 4.0 mmHg.  5. The inferior vena cava is normal in size with greater than 50% respiratory variability, suggesting right atrial pressure of 3 mmHg.  6. Cannot exclude a small PFO. Comparison(s): Prior images reviewed side by side. LVEF 60-65%. Normal estimated RVSP. Cannot exclude small PFO. FINDINGS  Left Ventricle: Left ventricular ejection fraction, by estimation, is 60 to 65%. The left ventricle has normal function. The left ventricle has no regional wall motion abnormalities. Definity contrast agent was given IV to delineate the left ventricular  endocardial borders. The left ventricular internal cavity size was normal in size. There is mild concentric left ventricular hypertrophy. Left ventricular diastolic parameters were normal. Right Ventricle: The right ventricular size is normal. No increase in right ventricular wall thickness. Right ventricular systolic function is normal. There is normal pulmonary artery systolic pressure. The tricuspid regurgitant velocity is 2.12 m/s, and  with an assumed right atrial pressure of 3 mmHg,  the estimated right ventricular systolic pressure is 21.0 mmHg. Left Atrium: Left atrial size was normal in size. Right Atrium: Right atrial size was normal in size. Pericardium: Trivial pericardial effusion is present. The pericardial effusion is posterior to the left ventricle.  Presence of epicardial fat layer. Mitral Valve: The mitral valve is grossly normal. Trivial mitral valve regurgitation. Tricuspid Valve: The tricuspid valve is grossly normal. Tricuspid valve regurgitation is mild. Aortic Valve: The aortic valve is tricuspid. There is mild calcification of the aortic valve. Aortic valve regurgitation is not visualized. Aortic valve sclerosis/calcification is present, without any evidence of aortic stenosis. Aortic valve mean gradient measures 4.0 mmHg. Aortic valve peak gradient measures 8.3 mmHg. Aortic valve area, by VTI measures 1.58 cm. Pulmonic Valve: The pulmonic valve was grossly normal. Pulmonic valve regurgitation is trivial. Aorta: The aortic root and ascending aorta are structurally normal, with no evidence of dilitation. Venous: The inferior vena cava is normal in size with greater than 50% respiratory variability, suggesting right atrial pressure of 3 mmHg. IAS/Shunts: Cannot exclude a small PFO.  LEFT VENTRICLE PLAX 2D LVIDd:         3.50 cm   Diastology LVIDs:         2.10 cm   LV e' medial:    6.74 cm/s LV PW:         1.20 cm   LV E/e' medial:  8.9 LV IVS:        1.00 cm   LV e' lateral:   8.27 cm/s LVOT diam:     1.90 cm   LV E/e' lateral: 7.2 LV SV:         40 LV SV Index:   21 LVOT Area:     2.84 cm  LEFT ATRIUM         Index LA diam:    2.90 cm 1.51 cm/m  AORTIC VALVE AV Area (Vmax):    1.51 cm AV Area (Vmean):   1.50 cm AV Area (VTI):     1.58 cm AV Vmax:           144.00 cm/s AV Vmean:          97.000 cm/s AV VTI:            0.253 m AV Peak Grad:      8.3 mmHg AV Mean Grad:      4.0 mmHg LVOT Vmax:         76.50 cm/s LVOT Vmean:        51.400 cm/s LVOT VTI:          0.141 m LVOT/AV VTI ratio: 0.56  AORTA Ao Root diam: 2.90 cm Ao Asc diam:  2.80 cm MITRAL VALVE               TRICUSPID VALVE MV Area (PHT): 3.50 cm    TR Peak grad:   18.0 mmHg MV Decel Time: 217 msec    TR Vmax:        212.00 cm/s MV E velocity: 59.70 cm/s MV A velocity: 58.70 cm/s  SHUNTS  MV E/A ratio:  1.02        Systemic VTI:  0.14 m                            Systemic Diam: 1.90 cm Nona Dell MD Electronically signed by Nona Dell MD Signature Date/Time: 10/07/2022/4:47:43 PM    Final    DG Chest 2  View  Result Date: 10/06/2022 CLINICAL DATA:  Left-sided chest pain radiates to the neck. EXAM: CHEST - 2 VIEW COMPARISON:  09/06/2022 FINDINGS: The lungs are clear without focal pneumonia, edema, pneumothorax or pleural effusion. The cardiopericardial silhouette is within normal limits for size. Status post CABG. IMPRESSION: No active cardiopulmonary disease. Electronically Signed   By: Kennith Center M.D.   On: 10/06/2022 17:51     Subjective: Patient seen examined bedside, resting calmly.  Sleeping but easy arousable.  NT present at bedside.  Patient reports she is upset that I "woke her up" this morning.  States "people are mean to me".  Also signed consent for heart catheterization but currently declining to have procedure done and wants to talk with cardiology.  Discussed with cardiology PA, have deferred invasive testing after discussion with her primary cardiologist, Dr. Lynnette Caffey; and recommend outpatient follow-up with stress testing in the office in which they will set up an appointment.  Recommended initiation of ranolazine and discontinuation of Benicar and amlodipine.  Patient was threatening to leave AMA but now will await discharge orders.  No other specific questions or concerns at this time.  Patient denies headache, no chest pain, no shortness of breath, no abdominal pain, no fever.  No acute events overnight per nursing staff.  Discharge Exam: Vitals:   10/09/22 0801 10/09/22 0834  BP: 133/89   Pulse: 86 92  Resp: 16   Temp: 98 F (36.7 C)   SpO2: 97%    Vitals:   10/09/22 0022 10/09/22 0357 10/09/22 0801 10/09/22 0834  BP: 105/80 114/85 133/89   Pulse: (!) 106 82 86 92  Resp: 18 14 16    Temp: 98 F (36.7 C) 98 F (36.7 C) 98 F (36.7 C)   TempSrc:  Oral Axillary Oral   SpO2: 95% 94% 97%   Weight:  90.7 kg    Height:        Physical Exam: GEN: NAD, alert and oriented x 3, obese, chronically ill appearance, appears older than stated age HEENT: NCAT, PERRL, EOMI, sclera clear, MMM PULM: CTAB w/o wheezes/crackles, normal respiratory effort, on room air CV: RRR w/o M/G/R GI: abd soft, NTND, NABS, no R/G/M MSK: no peripheral edema, spastic paresis bilateral lower extremities PSYCH: normal mood/affect Integumentary: dry/intact, no rashes or wounds    The results of significant diagnostics from this hospitalization (including imaging, microbiology, ancillary and laboratory) are listed below for reference.     Microbiology: Recent Results (from the past 240 hour(s))  Resp panel by RT-PCR (RSV, Flu A&B, Covid) Anterior Nasal Swab     Status: None   Collection Time: 10/06/22  4:27 PM   Specimen: Anterior Nasal Swab  Result Value Ref Range Status   SARS Coronavirus 2 by RT PCR NEGATIVE NEGATIVE Final   Influenza A by PCR NEGATIVE NEGATIVE Final   Influenza B by PCR NEGATIVE NEGATIVE Final    Comment: (NOTE) The Xpert Xpress SARS-CoV-2/FLU/RSV plus assay is intended as an aid in the diagnosis of influenza from Nasopharyngeal swab specimens and should not be used as a sole basis for treatment. Nasal washings and aspirates are unacceptable for Xpert Xpress SARS-CoV-2/FLU/RSV testing.  Fact Sheet for Patients: BloggerCourse.com  Fact Sheet for Healthcare Providers: SeriousBroker.it  This test is not yet approved or cleared by the [place] FDA and has been authorized for detection and/or diagnosis of SARS-CoV-2 by FDA under an Emergency Use Authorization (EUA). This EUA will remain in effect (meaning this test can be used) for  the duration of the COVID-19 declaration under Section 564(b)(1) of the Act, 21 U.S.C. section 360bbb-3(b)(1), unless the authorization is terminated  or revoked.     Resp Syncytial Virus by PCR NEGATIVE NEGATIVE Final    Comment: (NOTE) Fact Sheet for Patients: BloggerCourse.com  Fact Sheet for Healthcare Providers: SeriousBroker.it  This test is not yet approved or cleared by the [place] FDA and has been authorized for detection and/or diagnosis of SARS-CoV-2 by FDA under an Emergency Use Authorization (EUA). This EUA will remain in effect (meaning this test can be used) for the duration of the COVID-19 declaration under Section 564(b)(1) of the Act, 21 U.S.C. section 360bbb-3(b)(1), unless the authorization is terminated or revoked.  Performed at [facility], [address], [place], [place] [address]      Labs: BNP (last 3 results) Recent Labs    04/30/22 1245 05/22/22 0229 09/06/22 2242  BNP 26.3 49.6 19.5   Basic Metabolic Panel: Recent Labs  Lab 10/06/22 1520 10/07/22 0315 10/09/22 0529  NA 136 136 134*  K 4.0 3.8 4.3  CL 101 102 102  CO2 24 21* 20*  GLUCOSE 256* 207* 293*  BUN 21* 21* 23*  CREATININE 0.92 0.94 1.16*  CALCIUM 9.1 8.8* 8.9   Liver Function Tests: Recent Labs  Lab 10/07/22 0315  AST 14*  ALT 17  ALKPHOS 80  BILITOT 0.4  PROT 6.1*  ALBUMIN 2.7*   No results for input(s): "LIPASE", "AMYLASE" in the last 168 hours. No results for input(s): "AMMONIA" in the last 168 hours. CBC: Recent Labs  Lab 10/06/22 1520 10/07/22 0315 10/09/22 0529  WBC 8.7 8.3 8.6  HGB 12.2 11.3* 11.4*  HCT 38.3 35.0* 35.6*  MCV 78.6* 78.1* 78.2*  PLT 373 333 321   Cardiac Enzymes: No results for input(s): "CKTOTAL", "CKMB", "CKMBINDEX", "TROPONINI" in the last 168 hours. BNP: Invalid input(s): "POCBNP" CBG: Recent Labs  Lab 10/08/22 0758 10/08/22 1206 10/08/22 1700 10/08/22 2009 10/09/22 0804  GLUCAP 136* 119* 107* 105* 329*   D-Dimer Recent Labs    10/06/22 1649  DDIMER <0.27   Hgb A1c No results for input(s): "HGBA1C"  in the last 72 hours. Lipid Profile Recent Labs    10/07/22 0311  CHOL 132  HDL 35*  LDLCALC 66  TRIG 846*  CHOLHDL 3.8   Thyroid function studies Recent Labs    10/06/22 1649  TSH 0.816   Anemia work up No results for input(s): "VITAMINB12", "FOLATE", "FERRITIN", "TIBC", "IRON", "RETICCTPCT" in the last 72 hours. Urinalysis    Component Value Date/Time   COLORURINE YELLOW 07/04/2022 2031   APPEARANCEUR CLEAR 07/04/2022 2031   LABSPEC 1.010 07/04/2022 2031   PHURINE 5.0 07/04/2022 2031   GLUCOSEU NEGATIVE 07/04/2022 2031   HGBUR NEGATIVE 07/04/2022 2031   BILIRUBINUR NEGATIVE 07/04/2022 2031   KETONESUR NEGATIVE 07/04/2022 2031   PROTEINUR NEGATIVE 07/04/2022 2031   NITRITE POSITIVE (A) 07/04/2022 2031   LEUKOCYTESUR MODERATE (A) 07/04/2022 2031   Sepsis Labs Recent Labs  Lab 10/06/22 1520 10/07/22 0315 10/09/22 0529  WBC 8.7 8.3 8.6   Microbiology Recent Results (from the past 240 hour(s))  Resp panel by RT-PCR (RSV, Flu A&B, Covid) Anterior Nasal Swab     Status: None   Collection Time: 10/06/22  4:27 PM   Specimen: Anterior Nasal Swab  Result Value Ref Range Status   SARS Coronavirus 2 by RT PCR NEGATIVE NEGATIVE Final   Influenza A by PCR NEGATIVE NEGATIVE Final   Influenza B  by PCR NEGATIVE NEGATIVE Final    Comment: (NOTE) The Xpert Xpress SARS-CoV-2/FLU/RSV plus assay is intended as an aid in the diagnosis of influenza from Nasopharyngeal swab specimens and should not be used as a sole basis for treatment. Nasal washings and aspirates are unacceptable for Xpert Xpress SARS-CoV-2/FLU/RSV testing.  Fact Sheet for Patients: BloggerCourse.com  Fact Sheet for Healthcare Providers: SeriousBroker.it  This test is not yet approved or cleared by the [place] FDA and has been authorized for detection and/or diagnosis of SARS-CoV-2 by FDA under an Emergency Use Authorization (EUA). This EUA will  remain in effect (meaning this test can be used) for the duration of the COVID-19 declaration under Section 564(b)(1) of the Act, 21 U.S.C. section 360bbb-3(b)(1), unless the authorization is terminated or revoked.     Resp Syncytial Virus by PCR NEGATIVE NEGATIVE Final    Comment: (NOTE) Fact Sheet for Patients: BloggerCourse.com  Fact Sheet for Healthcare Providers: SeriousBroker.it  This test is not yet approved or cleared by the [place] FDA and has been authorized for detection and/or diagnosis of SARS-CoV-2 by FDA under an Emergency Use Authorization (EUA). This EUA will remain in effect (meaning this test can be used) for the duration of the COVID-19 declaration under Section 564(b)(1) of the Act, 21 U.S.C. section 360bbb-3(b)(1), unless the authorization is terminated or revoked.  Performed at [facility], [address], [place], [place] [address]      Time coordinating discharge: Over 30 minutes  SIGNED:   Alvira Philips [place], DO  [facility] 10/09/2022, 9:18 AM

## 2022-10-09 NOTE — TOC Transition Note (Signed)
Transition of Care [facility]) - CM/SW Discharge Note   Patient Details  Name: Lori [name] MRN: [id number] Date of Birth: 1970-03-10  Transition of Care [facility]) CM/SW Contact:  Gala Lewandowsky, RN Phone Number: 10/09/2022, 10:37 AM   Clinical Narrative:  Plan for patient to transition home today. Patient reports that she has personal care services via [facility]. Case Manager spoke with the patient and spouse this am regarding discharge. Spouse is aware of transition home today and will be in the home for patients arrival via ambulance. Case Manager called [facility] and they will arrive within the next hour. No further home needs identified.   Final next level of care: Home/Self Care Barriers to Discharge: No Barriers Identified  Discharge Plan and Services Additional resources added to the After Visit Summary for     Discharge Planning Services: CM Consult            DME Arranged: N/A   Date HH Agency Contacted: 10/08/22 Time HH Agency Contacted: 1523 Representative spoke with at [facility] Agency: Kandee Keen  Social Determinants of Health (SDOH) Interventions SDOH Screenings   Food Insecurity: No Food Insecurity (10/07/2022)  Housing: Low Risk  (10/07/2022)  Transportation Needs: No Transportation Needs (10/07/2022)  Utilities: Not At Risk (10/07/2022)  Depression (PHQ2-9): Low Risk  (09/30/2022)  Tobacco Use: Low Risk  (10/06/2022)   Readmission Risk Interventions    08/29/2021    2:32 PM 07/18/2021   12:43 PM 07/11/2021    1:22 PM  Readmission Risk Prevention Plan  Transportation Screening Complete Complete Complete  Medication Review Oceanographer) Complete Complete Complete  PCP or Specialist appointment within 3-5 days of discharge Complete Complete   HRI or Home Care Consult Complete Complete Complete  SW Recovery Care/Counseling Consult Complete Complete Complete  Palliative Care Screening Not Applicable Not Applicable Not Applicable  Skilled Nursing Facility Complete Complete  Complete

## 2022-10-09 NOTE — Transitions of Care (Post Inpatient/ED Visit) (Signed)
   10/09/2022  Name: Lori [name] MRN: [id number] DOB: Nov 14, 1970  Today's TOC FU Call Status: Today's TOC FU Call Status:: Successful TOC FU Call Completed TOC FU Call Complete Date: 10/09/22 Patient's Name and Date of Birth confirmed.  Transition Care Management Follow-up Telephone Call Date of Discharge: 10/06/22 Discharge Facility: [facility]) Type of Discharge: Emergency Department Reason for ED Visit: Cardiac Conditions (Chest Pain) How have you been since you were released from the hospital?: Better Any questions or concerns?: Yes Patient Questions/Concerns:: Patient states she will save questions for appointment.  Items Reviewed: Did you receive and understand the discharge instructions provided?: Yes Medications obtained,verified, and reconciled?: Yes (Medications Reviewed) Any new allergies since your discharge?: No Dietary orders reviewed?: No Do you have support at home?: Yes People in Home: spouse Name of Support/Comfort Primary Source: Lori [name]  Medications Reviewed Today: Medications Reviewed Today   Medications were not reviewed in this encounter     Home Care and Equipment/Supplies: Were Home Health Services Ordered?: No Any new equipment or medical supplies ordered?: No  Functional Questionnaire: Do you need assistance with bathing/showering or dressing?: Yes Do you need assistance with meal preparation?: Yes Do you need assistance with eating?: Yes Do you have difficulty maintaining continence: Yes Do you need assistance with getting out of bed/getting out of a chair/moving?: Yes Do you have difficulty managing or taking your medications?: Yes  Follow up appointments reviewed: PCP Follow-up appointment confirmed?: Yes Date of PCP follow-up appointment?: 10/16/22 Follow-up Provider: Richarda Blade, NP Specialist Hospital Follow-up appointment confirmed?: No Do you need transportation to your follow-up appointment?: No Do you understand care  options if your condition(s) worsen?: Yes-patient verbalized understanding    SIGNATURE: Hondo Nanda.D/RMA

## 2022-10-09 NOTE — Progress Notes (Addendum)
Patient Name: Lori [name] Date of Encounter: 10/09/2022 [facility] Cardiologist: Orbie Pyo, MD   Interval Summary  .    Patient frustrated this morning by NPO status and no longer wishes to proceed with heart catheterization for ischemic evaluation as initially planned for today. She denies any recurrence of chest pain or dyspnea in the last 24 hours and expresses a strong desire to go home.   Vital Signs .    Vitals:   10/09/22 0022 10/09/22 0357 10/09/22 0801 10/09/22 0834  BP: 105/80 114/85 133/89   Pulse: (!) 106 82 86 92  Resp: 18 14 16    Temp: 98 F (36.7 C) 98 F (36.7 C) 98 F (36.7 C)   TempSrc: Oral Axillary Oral   SpO2: 95% 94% 97%   Weight:  90.7 kg    Height:        Intake/Output Summary (Last 24 hours) at 10/09/2022 0842 Last data filed at 10/09/2022 0100 Gross per 24 hour  Intake --  Output 800 ml  Net -800 ml      10/09/2022    3:57 AM 10/08/2022    5:27 AM 10/07/2022    2:40 AM  Last 3 Weights  Weight (lbs) 200 lb 207 lb 0.2 oz 201 lb 15.1 oz  Weight (kg) 90.719 kg 93.9 kg 91.6 kg      Telemetry/ECG    Sinus rhythm, rates 70s-90s - Personally Reviewed  Physical Exam .   GEN: No acute distress.   Neck: No JVD Cardiac: RRR, no murmurs, rubs, or gallops.  Respiratory: Clear to auscultation bilaterally. GI: Soft, nontender, non-distended  MS: No edema  Assessment & Plan .     Atypical chest pain Hx CABG   Patient s/p 3V CABG in 2018 at [facility] in [place], [place]. She presented to [facility] after having onset of chest pain after eating/while at rest. She did not have radiation of pain or significant dyspnea. HS troponin negative and ECG without acute changes to indicate active ischemia (just previously noted non-specific TWI).    TTE without RWA or evidence of cardiomyopathy. Although work up this admission does not point to acute ischemia, given patient's DM status, atypical symptoms do not rule out progressive CAD as cause  of symptoms. On 9/3, due to concern that inpatient stress test would be non-diagnostic, a LHC was recommended for definitive clarification of status of coronary arteries and grafts. Due to use of Xarelto for DVT prophylaxis, LHC had to be delayed until today, 9/4. Dr. Lynnette Caffey who is patient's primary cardiologist also visited her today and patient expressed a desire to avoid LHC. After discussion with patient, Dr. Servando Salina, Dr. Lynnette Caffey, decision made to add anti-anginal therapy and plan for outpatient PET stress testing (order placed). Start Ranexa 500mg  BID. Continue Metoprolol Continue Plavix (ASA allergy)  Informed Consent   Shared Decision Making/Informed Consent The risks [chest pain, shortness of breath, cardiac arrhythmias, dizziness, blood pressure fluctuations, myocardial infarction, stroke/transient ischemic attack, nausea, vomiting, allergic reaction, radiation exposure, metallic taste sensation and life-threatening complications (estimated to be 1 in 10,000)], benefits (risk stratification, diagnosing coronary artery disease, treatment guidance) and alternatives of a nuclear stress test were discussed in detail with Lori [name] and she agrees to proceed.     Chronic diastolic CHF   TTE this admission with LVEF 60-65% with no RWA and normal diastolic parameters.    Low BP has limited GDMT this admission. On 9/3, patient's Amlodipine stopped and Metoprolol decreased to  50mg  BID.  Olmesartan-hydrochlorothiazide also held. BP now back up today.  Recommend returning to PTA dose of Metoprolol Tartrate 100mg  given better BP today as this also provides some anti-anginal effect.  Will arrange outpatient follow up so that medications can be further adjusted if needed.    Hypertension   Patient with history of difficult to control BP and has been seen by Dr. Duke Salvia in HTN clinic in the past. BP this admission low/low normal but on 9/4 has returned to a mildly hypertensive range.     BP  meds as above.   Hyperlipidemia   LDL 66 on max dose statin and Zetia. With her CVA and CABG history, LDL goal <55. Will place lipid clinic referral.    Lab Results  Component Value Date   CHOL 132 10/07/2022   HDL 35 (L) 10/07/2022   LDLCALC 66 10/07/2022   TRIG 157 (H) 10/07/2022   CHOLHDL 3.8 10/07/2022     Per primary team: Hx CVA DM type II Hypothyroidism Anxiety/depression  For questions or updates, please contact [facility] Please consult [email or web address] for contact info under     Signed, Perlie Gold, PA-C   Patient seen and examined, note reviewed with the signed Advanced Practice Provider. I personally reviewed laboratory data, imaging studies and relevant notes. I independently examined the patient and formulated the important aspects of the plan. I have personally discussed the plan with the patient and/or family. Comments or changes to the note/plan are indicated below.  During her hospitalization her medication has been optimized. In terms of her anginal symptoms this has improved and gree with pursuing outpatient diagnostic imaging study in this patient.   Thomasene Ripple DO, MS [facility] Attending Cardiologist [facility]  [address] [place], [place] [address] [phone] Website: [email or web address]

## 2022-10-11 ENCOUNTER — Encounter (HOSPITAL_COMMUNITY): Payer: Self-pay | Admitting: Emergency Medicine

## 2022-10-11 ENCOUNTER — Other Ambulatory Visit: Payer: Self-pay

## 2022-10-11 ENCOUNTER — Inpatient Hospital Stay (HOSPITAL_COMMUNITY)
Admission: EM | Admit: 2022-10-11 | Discharge: 2022-10-17 | DRG: 439 | Disposition: A | Discharge status: Home / Self Care | Payer: 59 | Attending: Family Medicine | Admitting: Family Medicine

## 2022-10-11 ENCOUNTER — Emergency Department (HOSPITAL_COMMUNITY): Payer: 59

## 2022-10-11 DIAGNOSIS — Z7901 Long term (current) use of anticoagulants: Secondary | ICD-10-CM | POA: Diagnosis not present

## 2022-10-11 DIAGNOSIS — F419 Anxiety disorder, unspecified: Secondary | ICD-10-CM | POA: Diagnosis present

## 2022-10-11 DIAGNOSIS — D649 Anemia, unspecified: Secondary | ICD-10-CM | POA: Insufficient documentation

## 2022-10-11 DIAGNOSIS — Z888 Allergy status to other drugs, medicaments and biological substances status: Secondary | ICD-10-CM

## 2022-10-11 DIAGNOSIS — Z7401 Bed confinement status: Secondary | ICD-10-CM | POA: Diagnosis not present

## 2022-10-11 DIAGNOSIS — I10 Essential (primary) hypertension: Secondary | ICD-10-CM | POA: Diagnosis present

## 2022-10-11 DIAGNOSIS — J690 Pneumonitis due to inhalation of food and vomit: Secondary | ICD-10-CM | POA: Insufficient documentation

## 2022-10-11 DIAGNOSIS — Z8673 Personal history of transient ischemic attack (TIA), and cerebral infarction without residual deficits: Secondary | ICD-10-CM

## 2022-10-11 DIAGNOSIS — Z833 Family history of diabetes mellitus: Secondary | ICD-10-CM

## 2022-10-11 DIAGNOSIS — F32A Depression, unspecified: Secondary | ICD-10-CM | POA: Diagnosis present

## 2022-10-11 DIAGNOSIS — E1140 Type 2 diabetes mellitus with diabetic neuropathy, unspecified: Secondary | ICD-10-CM | POA: Diagnosis present

## 2022-10-11 DIAGNOSIS — E1169 Type 2 diabetes mellitus with other specified complication: Secondary | ICD-10-CM

## 2022-10-11 DIAGNOSIS — Z885 Allergy status to narcotic agent status: Secondary | ICD-10-CM

## 2022-10-11 DIAGNOSIS — I110 Hypertensive heart disease with heart failure: Secondary | ICD-10-CM | POA: Diagnosis not present

## 2022-10-11 DIAGNOSIS — Z880 Allergy status to penicillin: Secondary | ICD-10-CM

## 2022-10-11 DIAGNOSIS — I700 Atherosclerosis of aorta: Secondary | ICD-10-CM | POA: Diagnosis not present

## 2022-10-11 DIAGNOSIS — Z832 Family history of diseases of the blood and blood-forming organs and certain disorders involving the immune mechanism: Secondary | ICD-10-CM

## 2022-10-11 DIAGNOSIS — Z951 Presence of aortocoronary bypass graft: Secondary | ICD-10-CM

## 2022-10-11 DIAGNOSIS — Z79899 Other long term (current) drug therapy: Secondary | ICD-10-CM | POA: Diagnosis not present

## 2022-10-11 DIAGNOSIS — Z90710 Acquired absence of both cervix and uterus: Secondary | ICD-10-CM

## 2022-10-11 DIAGNOSIS — Z7951 Long term (current) use of inhaled steroids: Secondary | ICD-10-CM

## 2022-10-11 DIAGNOSIS — E039 Hypothyroidism, unspecified: Secondary | ICD-10-CM | POA: Diagnosis present

## 2022-10-11 DIAGNOSIS — R1111 Vomiting without nausea: Secondary | ICD-10-CM | POA: Diagnosis not present

## 2022-10-11 DIAGNOSIS — E8809 Other disorders of plasma-protein metabolism, not elsewhere classified: Secondary | ICD-10-CM | POA: Diagnosis present

## 2022-10-11 DIAGNOSIS — Z7985 Long-term (current) use of injectable non-insulin antidiabetic drugs: Secondary | ICD-10-CM

## 2022-10-11 DIAGNOSIS — Z794 Long term (current) use of insulin: Secondary | ICD-10-CM | POA: Diagnosis not present

## 2022-10-11 DIAGNOSIS — I69354 Hemiplegia and hemiparesis following cerebral infarction affecting left non-dominant side: Secondary | ICD-10-CM

## 2022-10-11 DIAGNOSIS — K5730 Diverticulosis of large intestine without perforation or abscess without bleeding: Secondary | ICD-10-CM | POA: Diagnosis present

## 2022-10-11 DIAGNOSIS — E785 Hyperlipidemia, unspecified: Secondary | ICD-10-CM | POA: Diagnosis not present

## 2022-10-11 DIAGNOSIS — J45909 Unspecified asthma, uncomplicated: Secondary | ICD-10-CM | POA: Diagnosis not present

## 2022-10-11 DIAGNOSIS — Z743 Need for continuous supervision: Secondary | ICD-10-CM | POA: Diagnosis not present

## 2022-10-11 DIAGNOSIS — K859 Acute pancreatitis without necrosis or infection, unspecified: Principal | ICD-10-CM

## 2022-10-11 DIAGNOSIS — G8114 Spastic hemiplegia affecting left nondominant side: Secondary | ICD-10-CM | POA: Diagnosis present

## 2022-10-11 DIAGNOSIS — I2510 Atherosclerotic heart disease of native coronary artery without angina pectoris: Secondary | ICD-10-CM | POA: Diagnosis present

## 2022-10-11 DIAGNOSIS — Z8249 Family history of ischemic heart disease and other diseases of the circulatory system: Secondary | ICD-10-CM | POA: Diagnosis not present

## 2022-10-11 DIAGNOSIS — R6889 Other general symptoms and signs: Secondary | ICD-10-CM | POA: Diagnosis not present

## 2022-10-11 DIAGNOSIS — E0590 Thyrotoxicosis, unspecified without thyrotoxic crisis or storm: Secondary | ICD-10-CM | POA: Diagnosis not present

## 2022-10-11 DIAGNOSIS — Z7902 Long term (current) use of antithrombotics/antiplatelets: Secondary | ICD-10-CM | POA: Diagnosis not present

## 2022-10-11 DIAGNOSIS — I5032 Chronic diastolic (congestive) heart failure: Secondary | ICD-10-CM | POA: Diagnosis not present

## 2022-10-11 DIAGNOSIS — Z825 Family history of asthma and other chronic lower respiratory diseases: Secondary | ICD-10-CM

## 2022-10-11 DIAGNOSIS — E871 Hypo-osmolality and hyponatremia: Secondary | ICD-10-CM | POA: Diagnosis present

## 2022-10-11 DIAGNOSIS — E119 Type 2 diabetes mellitus without complications: Secondary | ICD-10-CM

## 2022-10-11 DIAGNOSIS — E66811 Obesity, class 1: Secondary | ICD-10-CM | POA: Insufficient documentation

## 2022-10-11 DIAGNOSIS — K8500 Idiopathic acute pancreatitis without necrosis or infection: Secondary | ICD-10-CM | POA: Diagnosis not present

## 2022-10-11 DIAGNOSIS — D509 Iron deficiency anemia, unspecified: Secondary | ICD-10-CM | POA: Diagnosis present

## 2022-10-11 DIAGNOSIS — Z91041 Radiographic dye allergy status: Secondary | ICD-10-CM

## 2022-10-11 DIAGNOSIS — R0789 Other chest pain: Secondary | ICD-10-CM | POA: Diagnosis not present

## 2022-10-11 DIAGNOSIS — Z6837 Body mass index (BMI) 37.0-37.9, adult: Secondary | ICD-10-CM

## 2022-10-11 DIAGNOSIS — K5900 Constipation, unspecified: Secondary | ICD-10-CM | POA: Diagnosis present

## 2022-10-11 DIAGNOSIS — I639 Cerebral infarction, unspecified: Secondary | ICD-10-CM | POA: Diagnosis present

## 2022-10-11 DIAGNOSIS — E7849 Other hyperlipidemia: Secondary | ICD-10-CM | POA: Diagnosis not present

## 2022-10-11 LAB — COMPREHENSIVE METABOLIC PANEL
ALT: 20 U/L (ref 0–44)
AST: 14 U/L — ABNORMAL LOW (ref 15–41)
Albumin: 4 g/dL (ref 3.5–5.0)
Alkaline Phosphatase: 87 U/L (ref 38–126)
Anion gap: 11 (ref 5–15)
BUN: 31 mg/dL — ABNORMAL HIGH (ref 6–20)
CO2: 26 mmol/L (ref 22–32)
Calcium: 9.5 mg/dL (ref 8.9–10.3)
Chloride: 101 mmol/L (ref 98–111)
Creatinine, Ser: 1.09 mg/dL — ABNORMAL HIGH (ref 0.44–1.00)
GFR, Estimated: >60 mL/min (ref >60)
Glucose, Bld: 107 mg/dL — ABNORMAL HIGH (ref 70–99)
Potassium: 3.8 mmol/L (ref 3.5–5.1)
Sodium: 138 mmol/L (ref 135–145)
Total Bilirubin: 0.4 mg/dL (ref 0.3–1.2)
Total Protein: 8.1 g/dL (ref 6.5–8.1)

## 2022-10-11 LAB — URINALYSIS, W/ REFLEX TO CULTURE (INFECTION SUSPECTED)
Bilirubin Urine: NEGATIVE
Glucose, UA: NEGATIVE mg/dL
Hgb urine dipstick: NEGATIVE
Ketones, ur: NEGATIVE mg/dL
Nitrite: NEGATIVE
Protein, ur: NEGATIVE mg/dL
Specific Gravity, Urine: 1.011 (ref 1.005–1.030)
pH: 7 (ref 5.0–8.0)

## 2022-10-11 LAB — CBC WITH DIFFERENTIAL/PLATELET
Abs Immature Granulocytes: 0.07 10*3/uL (ref 0.00–0.07)
Basophils Absolute: 0.1 10*3/uL (ref 0.0–0.1)
Basophils Relative: 0 %
Eosinophils Absolute: 0.2 10*3/uL (ref 0.0–0.5)
Eosinophils Relative: 1 %
HCT: 41.7 % (ref 36.0–46.0)
Hemoglobin: 13.1 g/dL (ref 12.0–15.0)
Immature Granulocytes: 1 %
Lymphocytes Relative: 25 %
Lymphs Abs: 3.5 10*3/uL (ref 0.7–4.0)
MCH: 25.4 pg — ABNORMAL LOW (ref 26.0–34.0)
MCHC: 31.4 g/dL (ref 30.0–36.0)
MCV: 80.8 fL (ref 80.0–100.0)
Monocytes Absolute: 1.1 10*3/uL — ABNORMAL HIGH (ref 0.1–1.0)
Monocytes Relative: 8 %
Neutro Abs: 9.4 10*3/uL — ABNORMAL HIGH (ref 1.7–7.7)
Neutrophils Relative %: 65 %
Platelets: 399 10*3/uL (ref 150–400)
RBC: 5.16 MIL/uL — ABNORMAL HIGH (ref 3.87–5.11)
RDW: 18.1 % — ABNORMAL HIGH (ref 11.5–15.5)
WBC: 14.3 10*3/uL — ABNORMAL HIGH (ref 4.0–10.5)
nRBC: 0 % (ref 0.0–0.2)

## 2022-10-11 LAB — LIPASE, BLOOD: Lipase: 179 U/L — ABNORMAL HIGH (ref 11–51)

## 2022-10-11 LAB — TROPONIN I (HIGH SENSITIVITY)
Troponin I (High Sensitivity): 3 ng/L (ref <18)
Troponin I (High Sensitivity): 3 ng/L (ref <18)

## 2022-10-11 MED ORDER — ONDANSETRON HCL 4 MG/2ML IJ SOLN
4.0000 mg | Freq: Once | INTRAMUSCULAR | Status: AC
  Administered 2022-10-11: 4 mg via INTRAVENOUS
  Filled 2022-10-11: qty 2

## 2022-10-11 MED ORDER — PROCHLORPERAZINE EDISYLATE 10 MG/2ML IJ SOLN
5.0000 mg | Freq: Four times a day (QID) | INTRAMUSCULAR | Status: DC | PRN
  Administered 2022-10-12: 5 mg via INTRAVENOUS
  Filled 2022-10-11: qty 2

## 2022-10-11 MED ORDER — LACTATED RINGERS IV SOLN: INTRAVENOUS | Status: DC

## 2022-10-11 MED ORDER — ENOXAPARIN SODIUM 40 MG/0.4ML IJ SOSY
40.0000 mg | PREFILLED_SYRINGE | Freq: Every day | INTRAMUSCULAR | Status: DC
  Filled 2022-10-11: qty 0.4

## 2022-10-11 MED ORDER — LACTATED RINGERS IV BOLUS
1000.0000 mL | Freq: Once | INTRAVENOUS | Status: AC
  Administered 2022-10-11: 1000 mL via INTRAVENOUS

## 2022-10-11 MED ORDER — POLYETHYLENE GLYCOL 3350 17 G PO PACK: 17.0000 g | PACK | Freq: Every day | ORAL | Status: DC | PRN

## 2022-10-11 MED ORDER — MELATONIN 5 MG PO TABS
5.0000 mg | ORAL_TABLET | Freq: Every evening | ORAL | Status: DC | PRN
  Administered 2022-10-12: 5 mg via ORAL
  Filled 2022-10-11: qty 1

## 2022-10-11 MED ORDER — ACETAMINOPHEN 325 MG PO TABS
650.0000 mg | ORAL_TABLET | Freq: Four times a day (QID) | ORAL | Status: DC | PRN
  Administered 2022-10-15: 650 mg via ORAL
  Filled 2022-10-11: qty 2

## 2022-10-11 MED ORDER — METOCLOPRAMIDE HCL 5 MG/ML IJ SOLN
10.0000 mg | Freq: Once | INTRAMUSCULAR | Status: AC
  Administered 2022-10-11: 10 mg via INTRAVENOUS
  Filled 2022-10-11: qty 2

## 2022-10-11 NOTE — H&P (Incomplete)
History and Physical  Lori [name] ZOX:[id number] DOB: January 29, 1971 DOA: 10/11/2022  Referring physician: Dr. Criss Alvine, EDP  PCP: Caesar Bookman, NP  Outpatient Specialists: Neurology, Cardiology Patient coming from: Home  Chief Complaint: Abdominal pain, nausea and vomiting.  HPI: Lori [name] is a 52 y.o. female with medical history significant for coronary artery disease status post CABG, CVA with spastic hemiplegia, chronic diastolic CHF, essential pretension, type 2 diabetes, hypothyroidism, chronic anxiety/depression, history of pheochromocytoma status post adrenalectomy, chronic headache, reactive airway disease, recently discharged on 10/09/2022 after admission for chest pain rule out ACS was seen by cardiology with plan to follow-up outpatient with her cardiologist.  The patient presents from home with nausea and vomiting, she is bedbound, EMS was activated and upon EMS arrival the patient was covered in emesis with concern for aspiration.  She was brought into [facility] ED for further evaluation.  In the ED, normotensive with tachycardia, O2 saturation of 97% on room air.  Lab studies notable for leukocytosis 14.3, neutrophil count 9.4.  Noncontrast CT abdomen and pelvis revealed findings suggestive of acute interstitial pancreatitis without evidence of hemorrhage or abscess.  IV fluid and IV antiemetics were initiated.  TRH, hospitalist service, was asked to admit.  ED Course: Tmax 98.8.  BP 126/87, pulse 115, respiratory rate 19, O2 saturation 97% on room air.  Review of Systems: Review of systems as noted in the HPI. All other systems reviewed and are negative.   Past Medical History:  Diagnosis Date   CAD (coronary artery disease)    Depression    Diabetes mellitus without complication (HCC)    History of CT scan    History of mammogram    History of MRI    Hypertension    Pheochromocytoma    Stroke [facility])    Thyroid disease    Past Surgical  History:  Procedure Laterality Date   ABDOMINAL HYSTERECTOMY     CESAREAN SECTION     3   CORONARY ARTERY BYPASS GRAFT     Right adrenal gland removal for pheochromocytoma Right     Social History:  reports that she has never smoked. She has never used smokeless tobacco. She reports that she does not currently use alcohol. She reports that she does not use drugs.   Allergies  Allergen Reactions   Contrast Media [Iodinated Contrast Media] Anaphylaxis and Swelling   Penicillins Swelling    Mouth swells up and eyes swollen shut   Sumatriptan Anaphylaxis   Vancomycin Other (See Comments)    Red Man's Syndrome   Aspirin Swelling   Ciprofloxacin     Feel nausea/vomitting   Morphine Swelling    Family History  Problem Relation Age of Onset   Hypertension Mother    Diabetes Mother    Atrial fibrillation Mother    Hypertension Father    Heart attack Father    Dementia Father    Diabetes Brother    Brain cancer Brother    Asthma Daughter    Sickle cell anemia Son    Atrial fibrillation Maternal Uncle       Prior to Admission medications   Medication Sig Start Date End Date Taking? Authorizing Provider  albuterol (PROVENTIL) (2.5 MG/3ML) 0.083% nebulizer solution Take 3 mLs (2.5 mg total) by nebulization every 6 (six) hours as needed for wheezing or shortness of breath. 05/21/22   Ngetich, Dinah C, NP  albuterol (VENTOLIN HFA) 108 (90 Base) MCG/ACT inhaler INHALE 2 PUFFS BY MOUTH EVERY 6  HOURS AS NEEDED FOR WHEEZING AND FOR SHORTNESS OF BREATH 08/19/22   Ngetich, Dinah C, NP  atorvastatin (LIPITOR) 80 MG tablet Take 1 tablet (80 mg total) by mouth daily. 09/02/22   Ngetich, Dinah C, NP  baclofen (LIORESAL) 10 MG tablet Take 1 tablet (10 mg total) by mouth 3 (three) times daily. X 1 week, then 10 mg TID-for spasticity- can increase  the dose if you call me to discuss 07/10/22   Maurilio Lovely D, DO  blood glucose meter kit and supplies 1 each by Other route as directed. Dispense based on  patient and insurance preference. Use up to four times daily as directed. (FOR ICD-10 E10.9, E11.9). 06/24/22   Ngetich, Dinah C, NP  budesonide (PULMICORT) 0.25 MG/2ML nebulizer solution Take 2 mLs (0.25 mg total) by nebulization 2 (two) times daily. 05/21/22   Ngetich, Dinah C, NP  clopidogrel (PLAVIX) 75 MG tablet Take 1 tablet (75 mg total) by mouth daily. 09/02/22   Ngetich, Dinah C, NP  Continuous Glucose Sensor (FREESTYLE LIBRE 14 DAY SENSOR) MISC 1 each by Does not apply route as needed. DX: E11.69 08/06/22   Sharon Seller, NP  cyclobenzaprine (FLEXERIL) 5 MG tablet Take 1 tablet (5 mg total) by mouth 3 (three) times daily as needed for muscle spasms. 07/10/22   Sherryll Burger, Pratik D, DO  diclofenac Sodium (VOLTAREN) 1 % GEL Apply 4 g topically 4 (four) times daily. 06/27/22   Ngetich, Dinah C, NP  escitalopram (LEXAPRO) 20 MG tablet Take 1 tablet (20 mg total) by mouth every morning. 09/02/22   Ngetich, Dinah C, NP  ezetimibe (ZETIA) 10 MG tablet Take 1 tablet (10 mg total) by mouth daily. 09/02/22   Ngetich, Dinah C, NP  gabapentin (NEURONTIN) 100 MG capsule Take 1 capsule (100 mg total) by mouth 3 (three) times daily. 09/02/22   Ngetich, Dinah C, NP  insulin lispro (HUMALOG) 100 UNIT/ML KwikPen Inject 15 Units into the skin 3 (three) times daily with meals. For blood sugars > 150 09/17/22   Ngetich, Dinah C, NP  Insulin Pen Needle 32G X 4 MM MISC Use to inject insulin 4 times daily as directed. 05/21/22   Ngetich, Donalee Citrin, NP  KLAYESTA powder Apply 1 Application topically 2 (two) times daily. Affected areas on breast fold, groin, and sacral areas. 05/16/22   [provider]  LANTUS SOLOSTAR 100 UNIT/ML Solostar Pen INJECT 40 UNITS SUBCUTANEOUSLY AT BEDTIME 08/21/22   Ngetich, Dinah C, NP  LORazepam (ATIVAN) 0.5 MG tablet Take 1 tablet (0.5 mg total) by mouth at bedtime. 08/27/22   Ngetich, Dinah C, NP  methimazole (TAPAZOLE) 5 MG tablet Take 1 tablet (5 mg total) by mouth every morning. 09/16/22    Ngetich, Dinah C, NP  metoprolol tartrate (LOPRESSOR) 100 MG tablet TAKE 1 TABLET BY MOUTH TWICE DAILY (BREAKFAST, BEDTIME) 09/02/22   Ngetich, Dinah C, NP  omeprazole (PRILOSEC) 20 MG capsule Take 1 capsule (20 mg total) by mouth daily. 09/02/22   Ngetich, Dinah C, NP  polyethylene glycol powder (GLYCOLAX/MIRALAX) 17 GM/SCOOP powder Take 17 g by mouth daily. Hold for loose stool 06/27/22   Ngetich, Dinah C, NP  ranolazine (RANEXA) 500 MG 12 hr tablet Take 1 tablet (500 mg total) by mouth 2 (two) times daily. 10/09/22 01/07/23  [place], Alvira Philips, DO  rivaroxaban (XARELTO) 10 MG TABS tablet Take 1 tablet (10 mg total) by mouth daily. 09/02/22   Ngetich, Dinah C, NP  Semaglutide,0.25 or 0.5MG /DOS, 2 MG/3ML SOPN Inject 0.5  mg into the skin once a week. 09/30/22   Ngetich, Dinah C, NP  tamsulosin (FLOMAX) 0.4 MG CAPS capsule Take 1 capsule (0.4 mg total) by mouth at bedtime. 09/02/22   Ngetich, Dinah C, NP  topiramate (TOPAMAX) 25 MG tablet Take 1 tablet (25 mg total) by mouth 2 (two) times daily. 07/10/22   Sherryll Burger, Pratik D, DO  triamcinolone ointment (KENALOG) 0.5 % Apply 1 Application topically 2 (two) times daily. 06/17/22   Medina-Vargas, Monina C, NP  zinc oxide (BALMEX) 11.3 % CREA cream Apply 1 Application topically 2 (two) times daily. 06/27/22   Ngetich, Donalee Citrin, NP    Physical Exam: BP (!) 152/90 (BP Location: Right Arm)   Pulse 96   Temp 98.8 F (37.1 C) (Oral)   Resp 18   Ht 5\' 2"  (1.575 m)   Wt 90.7 kg   SpO2 99%   BMI 36.57 kg/m   General: 52 y.o. year-old female well developed well nourished in no acute distress.  Alert and oriented x3. Cardiovascular: Regular rate and rhythm with no rubs or gallops.  No thyromegaly or JVD noted.  No lower extremity edema. 2/4 pulses in all 4 extremities. Respiratory: Clear to auscultation with no wheezes or rales. Good inspiratory effort. Abdomen: Soft nontender nondistended with normal bowel sounds x4 quadrants. Muskuloskeletal: No cyanosis, clubbing or  edema noted bilaterally Neuro: CN II-XII intact, strength, sensation, reflexes Skin: No ulcerative lesions noted or rashes Psychiatry: Judgement and insight appear normal. Mood is appropriate for condition and setting          Labs on Admission:  Basic Metabolic Panel: Recent Labs  Lab 10/06/22 1520 10/07/22 0315 10/09/22 0529 10/11/22 1903  NA 136 136 134* 138  K 4.0 3.8 4.3 3.8  CL 101 102 102 101  CO2 24 21* 20* 26  GLUCOSE 256* 207* 293* 107*  BUN 21* 21* 23* 31*  CREATININE 0.92 0.94 1.16* 1.09*  CALCIUM 9.1 8.8* 8.9 9.5   Liver Function Tests: Recent Labs  Lab 10/07/22 0315 10/11/22 1903  AST 14* 14*  ALT 17 20  ALKPHOS 80 87  BILITOT 0.4 0.4  PROT 6.1* 8.1  ALBUMIN 2.7* 4.0   Recent Labs  Lab 10/11/22 1903  LIPASE 179*   No results for input(s): "AMMONIA" in the last 168 hours. CBC: Recent Labs  Lab 10/06/22 1520 10/07/22 0315 10/09/22 0529 10/11/22 1903  WBC 8.7 8.3 8.6 14.3*  NEUTROABS  --   --   --  9.4*  HGB 12.2 11.3* 11.4* 13.1  HCT 38.3 35.0* 35.6* 41.7  MCV 78.6* 78.1* 78.2* 80.8  PLT 373 333 321 399   Cardiac Enzymes: No results for input(s): "CKTOTAL", "CKMB", "CKMBINDEX", "TROPONINI" in the last 168 hours.  BNP (last 3 results) Recent Labs    04/30/22 1245 05/22/22 0229 09/06/22 2242  BNP 26.3 49.6 19.5    ProBNP (last 3 results) No results for input(s): "PROBNP" in the last 8760 hours.  CBG: Recent Labs  Lab 10/08/22 0758 10/08/22 1206 10/08/22 1700 10/08/22 2009 10/09/22 0804  GLUCAP 136* 119* 107* 105* 329*    Radiological Exams on Admission: CT ABDOMEN PELVIS WO CONTRAST  Result Date: 10/11/2022 CLINICAL DATA:  Nonbilious vomiting. 52 year old female. Concern for aspiration. EXAM: CT ABDOMEN AND PELVIS WITHOUT CONTRAST TECHNIQUE: Multidetector CT imaging of the abdomen and pelvis was performed following the standard protocol without IV contrast. RADIATION DOSE REDUCTION: This exam was performed according to the  departmental dose-optimization program which includes automated exposure control,  adjustment of the mA and/or kV according to patient size and/or use of iterative reconstruction technique. COMPARISON:  CTs without contrast dated 07/04/2022 and 07/10/2021 FINDINGS: Lower chest: Sternotomy and old CABG changes with heavy native CAD particularly for age. There are calcifications in the aortic valve plane. The cardiac size is normal. There is no pericardial effusion. Small hiatal hernia. Linear scarring or atelectasis again seen in the lower lobes, right middle lobe but no acute lung base process. Hepatobiliary: No focal liver abnormality is seen without contrast. The gallbladder and bile ducts are unremarkable. Pancreas: Interval new finding of fatty stranding around the entire pancreas, development of mild generalized pancreatic edema. Findings consistent with acute interstitial pancreatitis. No pancreatic ductal dilatation is seen. No peripancreatic hemorrhage or abscess is seen. There is trace peripancreatic reactive fluid tracking slightly inferiorly. No pancreatic mass is seen without contrast. Spleen: No abnormality. Adrenals/Urinary Tract: Surgical clips from prior right adrenalectomy. Unremarkable left adrenal gland. There is no contour deforming abnormality of either kidney. Congenital malrotation of the right kidney. There is no evidence of urinary stones or obstruction. Unremarkable bladder. Stomach/Bowel: The stomach is contracted. There is a 2.8 cm diverticulum off the medial aspect of the second portion, containing debris and trace fluid. Rest of the small bowel is unremarkable and normal caliber. The appendix is normal and well seen. Mild-to-moderate fecal stasis ascending and transverse colon, left colonic diverticula without evidence of diverticulitis again noted. Vascular/Lymphatic: Extensive aortic and iliac/branch vessel atherosclerosis particularly for a 52 year old. No AAA. No lymphadenopathy is  seen. Reproductive: Status post hysterectomy. No adnexal masses. Other: Umbilical rectus diastasis and outward protrusion is again noted with several small right of the midline fat hernias and a small midline umbilical fat hernia. There is a small right inguinal fat hernia. Small caliber small bowel segments extend into the area of midline outward protrusion at the umbilicus but there is no incarcerated hernia. There has no pelvic ascites. No free hemorrhage, free air or abscess. Musculoskeletal: Possibly associated with the patient's C-section scar, in the deep subcutaneous plane in the midline abutting the low anterior pelvic wall there is an old irregular masslike density measuring 3.5 x 2 cm on 2:91, unchanged. This could represent a fibroma, dense scar related to old hematoma, or an extra-abdominal endometrioma. In any case it is entirely stable. There is slight lumbar levoscoliosis, mild osteopenia. Mild degenerative change lumbar spine. IMPRESSION: 1. Acute interstitial pancreatitis without evidence of hemorrhage or abscess. Minimal nonlocalizing reactive fluid. 2. Constipation and diverticulosis. 3. Age advanced atherosclerosis.  Prior CABG. 4. Umbilical rectus diastasis and outward protrusion with several small right of the midline fat hernias and a small midline umbilical fat hernia. Small caliber small bowel segments extend into the area of midline outward protrusion at the umbilicus but there is no incarcerated hernia or SBO. 5. Small hiatal hernia. 6. Stable 3.5 x 2 cm irregular masslike density in the deep subcutaneous plane abutting the low anterior pelvic wall. This could represent a fibroma, dense scar such as related to old hematoma, or an extra-abdominal endometrioma. In any case it is entirely unchanged. Aortic Atherosclerosis (ICD10-I70.0). Electronically Signed   By: Almira Bar M.D.   On: 10/11/2022 21:51   DG Chest Portable 1 View  Result Date: 10/11/2022 CLINICAL DATA:  Vomiting with  possible aspiration. EXAM: PORTABLE CHEST 1 VIEW COMPARISON:  October 06, 2022 FINDINGS: Limited study secondary to patient rotation. Multiple sternal wires and vascular clips are seen. The heart size and mediastinal contours are within  normal limits. Low lung volumes are present without evidence of an acute infiltrate, pleural effusion or pneumothorax. The visualized skeletal structures are unremarkable. IMPRESSION: 1. Evidence of prior median sternotomy/CABG. 2. Low lung volumes without acute or active cardiopulmonary disease. Electronically Signed   By: Aram Candela M.D.   On: 10/11/2022 20:57    EKG: I independently viewed the EKG done and my findings are as followed: Sinus rhythm rate of 96.  Nonspecific ST-T changes.  QTc 529.  Assessment/Plan Present on Admission:  Acute pancreatitis  Principal Problem:   Acute pancreatitis  Acute pancreatitis Presented with epigastric pain radiated to her back, nausea and vomiting, lipase 179, CT findings of acute pancreatitis Last triglyceride level 157. Unclear etiology IV fluid hydration Pain control  Presumptive aspiration pneumonia Unasyn Aspiration precautions. Maintain O2 saturation above 92%.  Coronary artery disease status post CABG Chronic HFpEF 60 to 65% Resume home regimen Strict I's and O's and daily weight  QTc prolongation Avoid QTc prolonging agents Optimize magnesium level greater than 2.0 Optimize potassium level greater than 4.0 Monitor on telemetry  History of CVA with spastic hemiparesis Resume home regimen Fall precautions   Time: 75 minutes.   DVT prophylaxis: Subcu Lovenox daily  Code Status: Full code  Family Communication: None at bedside  Disposition Plan: Admitted to telemetry unit  Consults called: None.  Admission status: Inpatient status.   Status is: Inpatient The patient requires at least 2 midnights for further evaluation and treatment of present condition.   Darlin Drop  MD [facility] Pager [phone]  If 7PM-7AM, please contact night-coverage [email or web address] Password [facility]  10/11/2022, 10:29 PM

## 2022-10-11 NOTE — Progress Notes (Signed)
Unable to establish PIV after multiple attempts. RN notified.

## 2022-10-11 NOTE — ED Triage Notes (Signed)
Pt BIBA from home-bedbound c/o vomiting, EMS reports concern for aspiration, Pt was found with her clothes covered in emesis.

## 2022-10-11 NOTE — ED Provider Notes (Signed)
[facility] AT [facility] Provider Note   CSN: [id number] Arrival date & time: 10/11/22  1816     History  Chief Complaint  Patient presents with   Aspiration    Lori [name] is a 52 y.o. female.  HPI 52 year old female presents with vomiting.  She has multiple comorbidities which include CAD, type 2 diabetes, pheochromocytoma, CVA with spastic paresis, and other comorbidities.  She developed vomiting a couple hours ago after her aide left her house.  Previous to this she was feeling fine.  Since then she felt like she choked on her emesis.  She feels like someone punched her in her right back/flank after this.  She has been urinating less since this happened.  No fevers, chest pain, shortness of breath or cough.  She is also still nauseated.  No diarrhea.  Home Medications Prior to Admission medications   Medication Sig Start Date End Date Taking? Authorizing Provider  albuterol (PROVENTIL) (2.5 MG/3ML) 0.083% nebulizer solution Take 3 mLs (2.5 mg total) by nebulization every 6 (six) hours as needed for wheezing or shortness of breath. 05/21/22   Ngetich, Dinah C, NP  albuterol (VENTOLIN HFA) 108 (90 Base) MCG/ACT inhaler INHALE 2 PUFFS BY MOUTH EVERY 6 HOURS AS NEEDED FOR WHEEZING AND FOR SHORTNESS OF BREATH 08/19/22   Ngetich, Dinah C, NP  atorvastatin (LIPITOR) 80 MG tablet Take 1 tablet (80 mg total) by mouth daily. 09/02/22   Ngetich, Dinah C, NP  baclofen (LIORESAL) 10 MG tablet Take 1 tablet (10 mg total) by mouth 3 (three) times daily. X 1 week, then 10 mg TID-for spasticity- can increase  the dose if you call me to discuss 07/10/22   Maurilio Lovely D, DO  blood glucose meter kit and supplies 1 each by Other route as directed. Dispense based on patient and insurance preference. Use up to four times daily as directed. (FOR ICD-10 E10.9, E11.9). 06/24/22   Ngetich, Dinah C, NP  budesonide (PULMICORT) 0.25 MG/2ML nebulizer solution Take 2 mLs (0.25 mg  total) by nebulization 2 (two) times daily. 05/21/22   Ngetich, Dinah C, NP  clopidogrel (PLAVIX) 75 MG tablet Take 1 tablet (75 mg total) by mouth daily. 09/02/22   Ngetich, Dinah C, NP  Continuous Glucose Sensor (FREESTYLE LIBRE 14 DAY SENSOR) MISC 1 each by Does not apply route as needed. DX: E11.69 08/06/22   Sharon Seller, NP  cyclobenzaprine (FLEXERIL) 5 MG tablet Take 1 tablet (5 mg total) by mouth 3 (three) times daily as needed for muscle spasms. 07/10/22   Sherryll Burger, Pratik D, DO  diclofenac Sodium (VOLTAREN) 1 % GEL Apply 4 g topically 4 (four) times daily. 06/27/22   Ngetich, Dinah C, NP  escitalopram (LEXAPRO) 20 MG tablet Take 1 tablet (20 mg total) by mouth every morning. 09/02/22   Ngetich, Dinah C, NP  ezetimibe (ZETIA) 10 MG tablet Take 1 tablet (10 mg total) by mouth daily. 09/02/22   Ngetich, Dinah C, NP  gabapentin (NEURONTIN) 100 MG capsule Take 1 capsule (100 mg total) by mouth 3 (three) times daily. 09/02/22   Ngetich, Dinah C, NP  insulin lispro (HUMALOG) 100 UNIT/ML KwikPen Inject 15 Units into the skin 3 (three) times daily with meals. For blood sugars > 150 09/17/22   Ngetich, Dinah C, NP  Insulin Pen Needle 32G X 4 MM MISC Use to inject insulin 4 times daily as directed. 05/21/22   Ngetich, Donalee Citrin, NP  KLAYESTA powder Apply 1 Application  topically 2 (two) times daily. Affected areas on breast fold, groin, and sacral areas. 05/16/22   [provider]  LANTUS SOLOSTAR 100 UNIT/ML Solostar Pen INJECT 40 UNITS SUBCUTANEOUSLY AT BEDTIME 08/21/22   Ngetich, Dinah C, NP  LORazepam (ATIVAN) 0.5 MG tablet Take 1 tablet (0.5 mg total) by mouth at bedtime. 08/27/22   Ngetich, Dinah C, NP  methimazole (TAPAZOLE) 5 MG tablet Take 1 tablet (5 mg total) by mouth every morning. 09/16/22   Ngetich, Dinah C, NP  metoprolol tartrate (LOPRESSOR) 100 MG tablet TAKE 1 TABLET BY MOUTH TWICE DAILY (BREAKFAST, BEDTIME) 09/02/22   Ngetich, Dinah C, NP  omeprazole (PRILOSEC) 20 MG capsule Take 1 capsule  (20 mg total) by mouth daily. 09/02/22   Ngetich, Dinah C, NP  polyethylene glycol powder (GLYCOLAX/MIRALAX) 17 GM/SCOOP powder Take 17 g by mouth daily. Hold for loose stool 06/27/22   Ngetich, Dinah C, NP  ranolazine (RANEXA) 500 MG 12 hr tablet Take 1 tablet (500 mg total) by mouth 2 (two) times daily. 10/09/22 01/07/23  [place], Alvira Philips, DO  rivaroxaban (XARELTO) 10 MG TABS tablet Take 1 tablet (10 mg total) by mouth daily. 09/02/22   Ngetich, Dinah C, NP  Semaglutide,0.25 or 0.5MG /DOS, 2 MG/3ML SOPN Inject 0.5 mg into the skin once a week. 09/30/22   Ngetich, Dinah C, NP  tamsulosin (FLOMAX) 0.4 MG CAPS capsule Take 1 capsule (0.4 mg total) by mouth at bedtime. 09/02/22   Ngetich, Dinah C, NP  topiramate (TOPAMAX) 25 MG tablet Take 1 tablet (25 mg total) by mouth 2 (two) times daily. 07/10/22   Sherryll Burger, Pratik D, DO  triamcinolone ointment (KENALOG) 0.5 % Apply 1 Application topically 2 (two) times daily. 06/17/22   Medina-Vargas, Monina C, NP  zinc oxide (BALMEX) 11.3 % CREA cream Apply 1 Application topically 2 (two) times daily. 06/27/22   Ngetich, Dinah C, NP      Allergies    Contrast media [iodinated contrast media], Penicillins, Sumatriptan, Vancomycin, Aspirin, Ciprofloxacin, and Morphine    Review of Systems   Review of Systems  Constitutional:  Negative for fever.  Respiratory:  Negative for cough and shortness of breath.   Cardiovascular:  Negative for chest pain.  Gastrointestinal:  Positive for nausea and vomiting.  Genitourinary:  Positive for flank pain. Negative for dysuria.    Physical Exam Updated Vital Signs BP (!) 152/90 (BP Location: Right Arm)   Pulse 96   Temp 98.8 F (37.1 C) (Oral)   Resp 18   Ht 5\' 2"  (1.575 m)   Wt 90.7 kg   SpO2 99%   BMI 36.57 kg/m  Physical Exam Vitals and nursing note reviewed.  Constitutional:      Appearance: She is well-developed. She is obese.  HENT:     Head: Normocephalic and atraumatic.  Cardiovascular:     Rate and Rhythm: Normal  rate and regular rhythm.     Heart sounds: Normal heart sounds.  Pulmonary:     Effort: Pulmonary effort is normal.     Breath sounds: Normal breath sounds.  Abdominal:     General: There is no distension.     Palpations: Abdomen is soft.     Tenderness: There is no abdominal tenderness.  Musculoskeletal:     Comments: No focal tenderness to her thoraco-lumbar back  Skin:    General: Skin is warm and dry.  Neurological:     Mental Status: She is alert.     ED Results / Procedures / Treatments  Labs (all labs ordered are listed, but only abnormal results are displayed) Labs Reviewed  COMPREHENSIVE METABOLIC PANEL - Abnormal; Notable for the following components:      Result Value   Glucose, Bld 107 (*)    BUN 31 (*)    Creatinine, Ser 1.09 (*)    AST 14 (*)    All other components within normal limits  CBC WITH DIFFERENTIAL/PLATELET - Abnormal; Notable for the following components:   WBC 14.3 (*)    RBC 5.16 (*)    MCH 25.4 (*)    RDW 18.1 (*)    Neutro Abs 9.4 (*)    Monocytes Absolute 1.1 (*)    All other components within normal limits  LIPASE, BLOOD - Abnormal; Notable for the following components:   Lipase 179 (*)    All other components within normal limits  URINALYSIS, W/ REFLEX TO CULTURE (INFECTION SUSPECTED)  CBC  COMPREHENSIVE METABOLIC PANEL  MAGNESIUM  PHOSPHORUS  LIPASE, BLOOD  TROPONIN I (HIGH SENSITIVITY)  TROPONIN I (HIGH SENSITIVITY)    EKG EKG Interpretation Date/Time:  Friday October 11 2022 22:05:56 EDT Ventricular Rate:  96 PR Interval:  170 QRS Duration:  70 QT Interval:  418 QTC Calculation: 529 R Axis:   67  Text Interpretation: Sinus rhythm Borderline T abnormalities, anterior leads Prolonged QT interval overall similar to Sept 1 2024 Confirmed by Pricilla Loveless [phone]) on 10/11/2022 11:03:12 PM  Radiology CT ABDOMEN PELVIS WO CONTRAST  Result Date: 10/11/2022 CLINICAL DATA:  Nonbilious vomiting. 52 year old female. Concern  for aspiration. EXAM: CT ABDOMEN AND PELVIS WITHOUT CONTRAST TECHNIQUE: Multidetector CT imaging of the abdomen and pelvis was performed following the standard protocol without IV contrast. RADIATION DOSE REDUCTION: This exam was performed according to the departmental dose-optimization program which includes automated exposure control, adjustment of the mA and/or kV according to patient size and/or use of iterative reconstruction technique. COMPARISON:  CTs without contrast dated 07/04/2022 and 07/10/2021 FINDINGS: Lower chest: Sternotomy and old CABG changes with heavy native CAD particularly for age. There are calcifications in the aortic valve plane. The cardiac size is normal. There is no pericardial effusion. Small hiatal hernia. Linear scarring or atelectasis again seen in the lower lobes, right middle lobe but no acute lung base process. Hepatobiliary: No focal liver abnormality is seen without contrast. The gallbladder and bile ducts are unremarkable. Pancreas: Interval new finding of fatty stranding around the entire pancreas, development of mild generalized pancreatic edema. Findings consistent with acute interstitial pancreatitis. No pancreatic ductal dilatation is seen. No peripancreatic hemorrhage or abscess is seen. There is trace peripancreatic reactive fluid tracking slightly inferiorly. No pancreatic mass is seen without contrast. Spleen: No abnormality. Adrenals/Urinary Tract: Surgical clips from prior right adrenalectomy. Unremarkable left adrenal gland. There is no contour deforming abnormality of either kidney. Congenital malrotation of the right kidney. There is no evidence of urinary stones or obstruction. Unremarkable bladder. Stomach/Bowel: The stomach is contracted. There is a 2.8 cm diverticulum off the medial aspect of the second portion, containing debris and trace fluid. Rest of the small bowel is unremarkable and normal caliber. The appendix is normal and well seen. Mild-to-moderate  fecal stasis ascending and transverse colon, left colonic diverticula without evidence of diverticulitis again noted. Vascular/Lymphatic: Extensive aortic and iliac/branch vessel atherosclerosis particularly for a 52 year old. No AAA. No lymphadenopathy is seen. Reproductive: Status post hysterectomy. No adnexal masses. Other: Umbilical rectus diastasis and outward protrusion is again noted with several small right of the midline fat hernias and  a small midline umbilical fat hernia. There is a small right inguinal fat hernia. Small caliber small bowel segments extend into the area of midline outward protrusion at the umbilicus but there is no incarcerated hernia. There has no pelvic ascites. No free hemorrhage, free air or abscess. Musculoskeletal: Possibly associated with the patient's C-section scar, in the deep subcutaneous plane in the midline abutting the low anterior pelvic wall there is an old irregular masslike density measuring 3.5 x 2 cm on 2:91, unchanged. This could represent a fibroma, dense scar related to old hematoma, or an extra-abdominal endometrioma. In any case it is entirely stable. There is slight lumbar levoscoliosis, mild osteopenia. Mild degenerative change lumbar spine. IMPRESSION: 1. Acute interstitial pancreatitis without evidence of hemorrhage or abscess. Minimal nonlocalizing reactive fluid. 2. Constipation and diverticulosis. 3. Age advanced atherosclerosis.  Prior CABG. 4. Umbilical rectus diastasis and outward protrusion with several small right of the midline fat hernias and a small midline umbilical fat hernia. Small caliber small bowel segments extend into the area of midline outward protrusion at the umbilicus but there is no incarcerated hernia or SBO. 5. Small hiatal hernia. 6. Stable 3.5 x 2 cm irregular masslike density in the deep subcutaneous plane abutting the low anterior pelvic wall. This could represent a fibroma, dense scar such as related to old hematoma, or an  extra-abdominal endometrioma. In any case it is entirely unchanged. Aortic Atherosclerosis (ICD10-I70.0). Electronically Signed   By: Almira Bar M.D.   On: 10/11/2022 21:51   DG Chest Portable 1 View  Result Date: 10/11/2022 CLINICAL DATA:  Vomiting with possible aspiration. EXAM: PORTABLE CHEST 1 VIEW COMPARISON:  October 06, 2022 FINDINGS: Limited study secondary to patient rotation. Multiple sternal wires and vascular clips are seen. The heart size and mediastinal contours are within normal limits. Low lung volumes are present without evidence of an acute infiltrate, pleural effusion or pneumothorax. The visualized skeletal structures are unremarkable. IMPRESSION: 1. Evidence of prior median sternotomy/CABG. 2. Low lung volumes without acute or active cardiopulmonary disease. Electronically Signed   By: Aram Candela M.D.   On: 10/11/2022 20:57    Procedures Ultrasound ED Peripheral IV (Provider)  Date/Time: 10/11/2022 8:37 PM  Performed by: Pricilla Loveless, MD Authorized by: Pricilla Loveless, MD   Procedure details:    Indications: multiple failed IV attempts     Skin Prep: chlorhexidine gluconate     Location:  Left AC   Angiocath:  22 G   Bedside Ultrasound Guided: Yes     Patient tolerated procedure without complications: Yes     Dressing applied: Yes       Medications Ordered in ED Medications  enoxaparin (LOVENOX) injection 40 mg (has no administration in time range)  acetaminophen (TYLENOL) tablet 650 mg (has no administration in time range)  prochlorperazine (COMPAZINE) injection 5 mg (has no administration in time range)  polyethylene glycol (MIRALAX / GLYCOLAX) packet 17 g (has no administration in time range)  melatonin tablet 5 mg (has no administration in time range)  lactated ringers bolus 1,000 mL (0 mLs Intravenous Stopped 10/11/22 2227)  ondansetron (ZOFRAN) injection 4 mg (4 mg Intravenous Given 10/11/22 2033)  metoCLOPramide (REGLAN) injection 10 mg (10 mg  Intravenous Given 10/11/22 2144)    ED Course/ Medical Decision Making/ A&P                                 Medical Decision Making Amount  and/or Complexity of Data Reviewed Labs: ordered.    Details: Elevated lipase consistent with pancreatitis.  Leukocytosis likely reactive to this. Radiology: ordered and independent interpretation performed.    Details: Acute pancreatitis ECG/medicine tests: ordered and independent interpretation performed.    Details: Nonspecific ST/T changes, unchanged  Risk Prescription drug management. Decision regarding hospitalization.   Patient's lab work and CT are consistent with pancreatitis.  This seems to be where her back pain and vomiting are coming from.  She is not currently vomiting but still quite nauseated.  Given multiple doses of antiemetics but still feeling poorly.  She has declined pain meds but given the new onset of pancreatitis and her symptoms I think is reasonable to admit for further symptom control.  Discussed with Dr. Margo Aye for admission.        Final Clinical Impression(s) / ED Diagnoses Final diagnoses:  Acute pancreatitis without infection or necrosis, unspecified pancreatitis type    Rx / DC Orders ED Discharge Orders     None         Pricilla Loveless, MD 10/11/22 2303

## 2022-10-12 DIAGNOSIS — I5032 Chronic diastolic (congestive) heart failure: Secondary | ICD-10-CM

## 2022-10-12 DIAGNOSIS — J690 Pneumonitis due to inhalation of food and vomit: Secondary | ICD-10-CM | POA: Diagnosis not present

## 2022-10-12 DIAGNOSIS — K859 Acute pancreatitis without necrosis or infection, unspecified: Secondary | ICD-10-CM | POA: Diagnosis not present

## 2022-10-12 LAB — COMPREHENSIVE METABOLIC PANEL
ALT: 18 U/L (ref 0–44)
AST: 14 U/L — ABNORMAL LOW (ref 15–41)
Albumin: 3.4 g/dL — ABNORMAL LOW (ref 3.5–5.0)
Alkaline Phosphatase: 77 U/L (ref 38–126)
Anion gap: 13 (ref 5–15)
BUN: 24 mg/dL — ABNORMAL HIGH (ref 6–20)
CO2: 23 mmol/L (ref 22–32)
Calcium: 8.9 mg/dL (ref 8.9–10.3)
Chloride: 99 mmol/L (ref 98–111)
Creatinine, Ser: 0.91 mg/dL (ref 0.44–1.00)
GFR, Estimated: >60 mL/min (ref >60)
Glucose, Bld: 183 mg/dL — ABNORMAL HIGH (ref 70–99)
Potassium: 3.7 mmol/L (ref 3.5–5.1)
Sodium: 135 mmol/L (ref 135–145)
Total Bilirubin: 0.5 mg/dL (ref 0.3–1.2)
Total Protein: 6.9 g/dL (ref 6.5–8.1)

## 2022-10-12 LAB — CBC
HCT: 36.4 % (ref 36.0–46.0)
Hemoglobin: 12.1 g/dL (ref 12.0–15.0)
MCH: 26 pg (ref 26.0–34.0)
MCHC: 33.2 g/dL (ref 30.0–36.0)
MCV: 78.1 fL — ABNORMAL LOW (ref 80.0–100.0)
Platelets: 371 10*3/uL (ref 150–400)
RBC: 4.66 MIL/uL (ref 3.87–5.11)
RDW: 17.2 % — ABNORMAL HIGH (ref 11.5–15.5)
WBC: 15 10*3/uL — ABNORMAL HIGH (ref 4.0–10.5)
nRBC: 0 % (ref 0.0–0.2)

## 2022-10-12 LAB — MAGNESIUM: Magnesium: 1.8 mg/dL (ref 1.7–2.4)

## 2022-10-12 LAB — GLUCOSE, CAPILLARY
Glucose-Capillary: 167 mg/dL — ABNORMAL HIGH (ref 70–99)
Glucose-Capillary: 125 mg/dL — ABNORMAL HIGH (ref 70–99)

## 2022-10-12 LAB — LIPASE, BLOOD: Lipase: 133 U/L — ABNORMAL HIGH (ref 11–51)

## 2022-10-12 LAB — PHOSPHORUS: Phosphorus: 3.2 mg/dL (ref 2.5–4.6)

## 2022-10-12 MED ORDER — INSULIN ASPART 100 UNIT/ML IJ SOLN
0.0000 [IU] | Freq: Every day | INTRAMUSCULAR | Status: DC
  Administered 2022-10-14: 4 [IU] via SUBCUTANEOUS
  Administered 2022-10-15: 2 [IU] via SUBCUTANEOUS
  Administered 2022-10-16: 3 [IU] via SUBCUTANEOUS

## 2022-10-12 MED ORDER — RIVAROXABAN 10 MG PO TABS
10.0000 mg | ORAL_TABLET | Freq: Every day | ORAL | Status: DC
  Administered 2022-10-12 – 2022-10-16 (×5): 10 mg via ORAL
  Filled 2022-10-12 (×5): qty 1

## 2022-10-12 MED ORDER — FENTANYL CITRATE PF 50 MCG/ML IJ SOSY: 12.5000 ug | PREFILLED_SYRINGE | INTRAMUSCULAR | Status: DC | PRN

## 2022-10-12 MED ORDER — ONDANSETRON HCL 4 MG/2ML IJ SOLN
4.0000 mg | Freq: Four times a day (QID) | INTRAMUSCULAR | Status: DC | PRN
  Administered 2022-10-12: 4 mg via INTRAVENOUS
  Filled 2022-10-12: qty 2

## 2022-10-12 MED ORDER — CLOPIDOGREL BISULFATE 75 MG PO TABS
75.0000 mg | ORAL_TABLET | Freq: Every day | ORAL | Status: DC
  Administered 2022-10-12 – 2022-10-16 (×5): 75 mg via ORAL
  Filled 2022-10-12 (×5): qty 1

## 2022-10-12 MED ORDER — GABAPENTIN 100 MG PO CAPS
100.0000 mg | ORAL_CAPSULE | Freq: Three times a day (TID) | ORAL | Status: DC
  Administered 2022-10-12 – 2022-10-16 (×13): 100 mg via ORAL
  Filled 2022-10-12 (×14): qty 1

## 2022-10-12 MED ORDER — ALBUTEROL SULFATE (2.5 MG/3ML) 0.083% IN NEBU: 2.5000 mg | INHALATION_SOLUTION | Freq: Four times a day (QID) | RESPIRATORY_TRACT | Status: DC | PRN

## 2022-10-12 MED ORDER — PROCHLORPERAZINE EDISYLATE 10 MG/2ML IJ SOLN
5.0000 mg | INTRAMUSCULAR | Status: DC | PRN
  Administered 2022-10-12: 5 mg via INTRAVENOUS
  Filled 2022-10-12: qty 2

## 2022-10-12 MED ORDER — ATORVASTATIN CALCIUM 40 MG PO TABS
80.0000 mg | ORAL_TABLET | Freq: Every day | ORAL | Status: DC
  Administered 2022-10-12 – 2022-10-16 (×5): 80 mg via ORAL
  Filled 2022-10-12 (×5): qty 2

## 2022-10-12 MED ORDER — EZETIMIBE 10 MG PO TABS
10.0000 mg | ORAL_TABLET | Freq: Every day | ORAL | Status: DC
  Administered 2022-10-12 – 2022-10-16 (×5): 10 mg via ORAL
  Filled 2022-10-12 (×5): qty 1

## 2022-10-12 MED ORDER — INSULIN GLARGINE-YFGN 100 UNIT/ML ~~LOC~~ SOLN
20.0000 [IU] | Freq: Every day | SUBCUTANEOUS | Status: DC
  Administered 2022-10-12 – 2022-10-16 (×5): 20 [IU] via SUBCUTANEOUS
  Filled 2022-10-12 (×5): qty 0.2

## 2022-10-12 MED ORDER — ALBUTEROL SULFATE HFA 108 (90 BASE) MCG/ACT IN AERS: 1.0000 | INHALATION_SPRAY | Freq: Four times a day (QID) | RESPIRATORY_TRACT | Status: DC | PRN

## 2022-10-12 MED ORDER — BUDESONIDE 0.25 MG/2ML IN SUSP
0.2500 mg | Freq: Two times a day (BID) | RESPIRATORY_TRACT | Status: DC
  Administered 2022-10-12 – 2022-10-16 (×8): 0.25 mg via RESPIRATORY_TRACT
  Filled 2022-10-12 (×10): qty 2

## 2022-10-12 MED ORDER — PANTOPRAZOLE SODIUM 40 MG PO TBEC
40.0000 mg | DELAYED_RELEASE_TABLET | Freq: Every day | ORAL | Status: DC
  Administered 2022-10-12 – 2022-10-16 (×5): 40 mg via ORAL
  Filled 2022-10-12 (×5): qty 1

## 2022-10-12 MED ORDER — TAMSULOSIN HCL 0.4 MG PO CAPS
0.4000 mg | ORAL_CAPSULE | Freq: Every day | ORAL | Status: DC
  Administered 2022-10-12 – 2022-10-16 (×5): 0.4 mg via ORAL
  Filled 2022-10-12 (×5): qty 1

## 2022-10-12 MED ORDER — IPRATROPIUM-ALBUTEROL 0.5-2.5 (3) MG/3ML IN SOLN
3.0000 mL | Freq: Two times a day (BID) | RESPIRATORY_TRACT | Status: DC
  Administered 2022-10-12 – 2022-10-16 (×8): 3 mL via RESPIRATORY_TRACT
  Filled 2022-10-12 (×9): qty 3

## 2022-10-12 MED ORDER — LACTATED RINGERS IV SOLN: INTRAVENOUS | Status: DC

## 2022-10-12 MED ORDER — SODIUM CHLORIDE 0.9 % IV SOLN
2.0000 g | Freq: Every day | INTRAVENOUS | Status: DC
  Administered 2022-10-12 – 2022-10-16 (×5): 2 g via INTRAVENOUS
  Filled 2022-10-12 (×5): qty 20

## 2022-10-12 MED ORDER — MAGNESIUM SULFATE 2 GM/50ML IV SOLN
2.0000 g | Freq: Once | INTRAVENOUS | Status: AC
  Administered 2022-10-12: 2 g via INTRAVENOUS
  Filled 2022-10-12: qty 50

## 2022-10-12 MED ORDER — OXYCODONE HCL 5 MG PO TABS: 5.0000 mg | ORAL_TABLET | Freq: Four times a day (QID) | ORAL | Status: DC | PRN

## 2022-10-12 MED ORDER — METOPROLOL TARTRATE 25 MG PO TABS
25.0000 mg | ORAL_TABLET | Freq: Two times a day (BID) | ORAL | Status: DC
  Administered 2022-10-12 – 2022-10-16 (×10): 25 mg via ORAL
  Filled 2022-10-12 (×10): qty 1

## 2022-10-12 MED ORDER — INSULIN ASPART 100 UNIT/ML IJ SOLN
0.0000 [IU] | Freq: Three times a day (TID) | INTRAMUSCULAR | Status: DC
  Administered 2022-10-12 – 2022-10-13 (×3): 3 [IU] via SUBCUTANEOUS
  Administered 2022-10-14 (×2): 2 [IU] via SUBCUTANEOUS
  Administered 2022-10-14: 3 [IU] via SUBCUTANEOUS
  Administered 2022-10-15 (×3): 5 [IU] via SUBCUTANEOUS
  Administered 2022-10-16: 3 [IU] via SUBCUTANEOUS
  Administered 2022-10-16: 8 [IU] via SUBCUTANEOUS
  Administered 2022-10-16: 5 [IU] via SUBCUTANEOUS

## 2022-10-12 MED ORDER — IPRATROPIUM-ALBUTEROL 0.5-2.5 (3) MG/3ML IN SOLN
3.0000 mL | Freq: Four times a day (QID) | RESPIRATORY_TRACT | Status: DC
  Filled 2022-10-12: qty 3

## 2022-10-12 MED ORDER — RANOLAZINE ER 500 MG PO TB12
500.0000 mg | ORAL_TABLET | Freq: Two times a day (BID) | ORAL | Status: DC
  Administered 2022-10-12 – 2022-10-16 (×10): 500 mg via ORAL
  Filled 2022-10-12 (×11): qty 1

## 2022-10-12 MED ORDER — METHIMAZOLE 5 MG PO TABS
5.0000 mg | ORAL_TABLET | Freq: Every morning | ORAL | Status: DC
  Administered 2022-10-12 – 2022-10-16 (×5): 5 mg via ORAL
  Filled 2022-10-12 (×5): qty 1

## 2022-10-12 MED ORDER — ESCITALOPRAM OXALATE 10 MG PO TABS
20.0000 mg | ORAL_TABLET | Freq: Every morning | ORAL | Status: DC
  Administered 2022-10-12 – 2022-10-16 (×5): 20 mg via ORAL
  Filled 2022-10-12 (×5): qty 2

## 2022-10-12 NOTE — Evaluation (Signed)
Occupational Therapy Evaluation Patient Details Name: Lori [name] MRN: [id number] DOB: [birth year] Today's Date: 10/12/2022   History of Present Illness 52 yr old female admitted with acute pancreatitis. PMHx: DM, obesity, CVA with L weakness, hyperthyroidism, HTN, CAD, anxiety and depression, HLD, GERD, aphasia, AKI, polypharmacy, AMS.   Clinical Impression   Pt is limited by below listed deficits which compromise her ADL performance and overall functional independence (see OT problem list). Pt with history of L sided weakness, which is worse in her L UE than L LE. She requires assist for ADL tasks including dressing, bathing, and toileting. She will benefit from OT services to maximize her ADL performance and to decrease the risk for restricted participation in meaningful activities. She desires to return home with home health OT and PT and resumption of daily home health aide services. Her personal goals are to walk better and to be able to cook again.       If plan is discharge home, recommend the following: A lot of help with bathing/dressing/bathroom;Assistance with cooking/housework;Assist for transportation;A lot of help with walking and/or transfers    Functional Status Assessment  Patient has had a recent decline in their functional status and demonstrates the ability to make significant improvements in function in a reasonable and predictable amount of time.  Equipment Recommendations  None recommended by OT    Recommendations for Other Services       Precautions / Restrictions Precautions Precautions: Fall Precaution Comments:  (baseline L sided weakness, worse in L UE than L LE) Restrictions Weight Bearing Restrictions: No            ADL either performed or assessed with clinical judgement   ADL Overall ADL's : Needs assistance/impaired Eating/Feeding: Set up;Bed level   Grooming: Minimal assistance;Bed level           Upper Body Dressing : Maximal  assistance;Bed level   Lower Body Dressing: Maximal assistance;Bed level       Toileting- Clothing Manipulation and Hygiene: Maximal assistance;Bed level         General ADL Comments: ADL performance compromised by L sided weakness     Vision   Additional Comments: she correctly read the time depicted on the wall clock, while wearing glasses            Pertinent Vitals/Pain Pain Assessment Pain Assessment: 0-10 Pain Score: 5  Pain Location: abdomen Pain Intervention(s): Limited activity within patient's tolerance, Monitored during session     Extremity/Trunk Assessment Upper Extremity Assessment Upper Extremity Assessment: Right hand dominant;RUE deficits/detail;LUE deficits/detail RUE Deficits / Details: AROM and strength WFL LUE Deficits / Details: Chronic AROM and strength deficits. Required AAROM to PROM. Non-functional grip. Increased tone noted. Baseline partial numbness LUE Sensation: decreased light touch   Lower Extremity Assessment Lower Extremity Assessment: RLE deficits/detail;LLE deficits/detail RLE Deficits / Details: AROM and strength WFL LLE Deficits / Details: Knee AROM WFL. Baseline weakness, though not severe. She denied numbness of L LE      Communication Communication Following commands: Follows one step commands consistently   Cognition Arousal: Alert Behavior During Therapy: WFL for tasks assessed/performed Overall Cognitive Status: Within Functional Limits for tasks assessed          General Comments: Baseline expressive difficulties, Oriented x4, cooperative                Home Living Family/patient expects to be discharged to:: Private residence Living Arrangements: Spouse/significant other Available Help at Discharge: Family;Available PRN/intermittently;Personal care attendant Type  of Home: Apartment Home Access: Level entry    Home Layout: One level     Bathroom Shower/Tub: Tub/shower unit        Home Equipment:  Hospital bed;Wheelchair - manual;Tub bench;Other (comment) (Platform walker)      Prior Functioning/Environment Prior Level of Function : Needs assist             Mobility Comments:  (She was receiving home health PT and was able to ambulate in her home with assist using a platform walker up until ~ 2wks ago; she stated her home therapist is out on maternity leave. Otherwise, she uses a manual wheelchair for mobility, requiring min-mod assist for squat vs. stand-pivot transfers into and out of wheelchair. Required assist to propel wheelchair.  ADLs Comments:  (She has home health aides 7 days per week for 3 hrs a day who assist with bathing and dressing. She reported having a "Purewick" for toileting management. She could feed herself.)        OT Problem List: Decreased strength;Decreased range of motion;Impaired balance (sitting and/or standing);Decreased coordination;Impaired sensation;Impaired tone;Impaired UE functional use;Pain      OT Treatment/Interventions: Self-care/ADL training;Therapeutic exercise;Neuromuscular education;Energy conservation;DME and/or AE instruction;Therapeutic activities;Balance training;Patient/family education    OT Goals(Current goals can be found in the care plan section) Acute Rehab OT Goals Patient Stated Goal: to be able to walk better and to be able to cook again OT Goal Formulation: With patient Time For Goal Achievement: 10/26/22 Potential to Achieve Goals: Good ADL Goals Pt Will Perform Upper Body Dressing: with min assist;sitting Pt Will Perform Lower Body Dressing: with min assist;sitting/lateral leans Pt Will Transfer to Toilet: with min assist;bedside commode Additional ADL Goal #1: Pt will perform bed mobility with min assist, in prep for progressive ADL participation.  OT Frequency: Min 1X/week       AM-PAC OT "6 Clicks" Daily Activity     Outcome Measure Help from another person eating meals?: A Little Help from another person  taking care of personal grooming?: A Little Help from another person toileting, which includes using toliet, bedpan, or urinal?: A Lot Help from another person bathing (including washing, rinsing, drying)?: A Lot Help from another person to put on and taking off regular upper body clothing?: A Lot Help from another person to put on and taking off regular lower body clothing?: A Lot 6 Click Score: 14   End of Session Equipment Utilized During Treatment: Other (comment) (N/A) Nurse Communication: Mobility status  Activity Tolerance: Patient tolerated treatment well Patient left: in bed;with call bell/phone within reach;with family/visitor present;with bed alarm set  OT Visit Diagnosis: Muscle weakness (generalized) (M62.81);Hemiplegia and hemiparesis Hemiplegia - Right/Left: Left Hemiplegia - dominant/non-dominant: Non-Dominant                Time: 2440-1027 OT Time Calculation (min): 19 min Charges:  OT General Charges $OT Visit: 1 Visit OT Evaluation $OT Eval Moderate Complexity: 1 Mod    Lemond Griffee L Darrielle Pflieger, OTR/L 10/12/2022, 5:34 PM

## 2022-10-12 NOTE — ED Notes (Signed)
ED TO INPATIENT HANDOFF REPORT  Name/Age/Gender Lori [name] 52 y.o. female  Code Status    Code Status Orders  (From admission, onward)           Start     Ordered   10/11/22 2229  Full code  Continuous       Question:  By:  Answer:  Consent: discussion documented in EHR   10/11/22 2228           Code Status History     Date Active Date Inactive Code Status Order ID Comments User Context   10/06/2022 2004 10/09/2022 1708 Full Code [id number]  Tereasa Coop, MD ED   07/04/2022 2325 07/10/2022 1618 Full Code [id number]  Briscoe Deutscher, MD ED   04/30/2022 1614 05/03/2022 1908 Full Code [id number]  Synetta Fail, MD ED   11/02/2021 1128 11/08/2021 0643 Full Code [id number]  Margie Ege A, DO ED   08/24/2021 2019 08/30/2021 1815 Full Code [id number]  Hillary Bow, DO ED   07/10/2021 1522 07/18/2021 1953 Full Code [id number]  Joseph Art, DO ED   04/08/2021 1845 04/09/2021 2309 Full Code [id number]  Henderly, Britni A, PA-C ED   02/26/2021 1411 03/16/2021 1843 Full Code [id number]  Jacquelynn Cree, PA-C Inpatient   02/19/2021 1516 02/26/2021 1407 Full Code [id number]  Bobette Mo, MD ED   02/07/2021 1544 02/16/2021 0241 Full Code [id number]  Teddy Spike, DO ED   01/19/2021 1218 02/02/2021 1701 Full Code [id number]  Lorretta Harp, MD ED       Home/SNF/Other Home  Chief Complaint Acute pancreatitis [K85.90]  Level of Care/Admitting Diagnosis ED Disposition     ED Disposition  Admit   Condition  --   Comment  Hospital Area: [facility] [[id number]]  Level of Care: Telemetry [5]  Admit to tele based on following criteria: Monitor for Ischemic changes  May admit patient to [facility] or [facility] if equivalent level of care is available:: Yes  Covid Evaluation: Asymptomatic - no recent exposure (last 10 days) testing not required  Diagnosis: Acute pancreatitis [577.0.ICD-9-CM]  Admitting Physician: Darlin Drop [[id number]]  Attending Physician: Darlin Drop [[id number]]  Certification:: I certify this patient will need inpatient services for at least 2 midnights  Expected Medical Readiness: 10/13/2022          Medical History Past Medical History:  Diagnosis Date   CAD (coronary artery disease)    Depression    Diabetes mellitus without complication (HCC)    History of CT scan    History of mammogram    History of MRI    Hypertension    Pheochromocytoma    Stroke [facility])    Thyroid disease     Allergies Allergies  Allergen Reactions   Contrast Media [Iodinated Contrast Media] Anaphylaxis and Swelling   Penicillins Swelling    Mouth swells up and eyes swollen shut   Sumatriptan Anaphylaxis   Vancomycin Other (See Comments)    Red Man's Syndrome   Aspirin Swelling   Ciprofloxacin     Feel nausea/vomitting   Morphine Swelling    IV Location/Drains/Wounds Patient Lines/Drains/Airways Status     Active Line/Drains/Airways     Name Placement date Placement time Site Days   Peripheral IV 10/09/22 22 G 1" Right;Anterior Forearm 10/09/22  0624  Forearm  3            Labs/Imaging Results for orders placed or performed during the hospital encounter  of 10/11/22 (from the past 48 hour(s))  Comprehensive metabolic panel     Status: Abnormal   Collection Time: 10/11/22  7:03 PM  Result Value Ref Range   Sodium 138 135 - 145 mmol/L   Potassium 3.8 3.5 - 5.1 mmol/L   Chloride 101 98 - 111 mmol/L   CO2 26 22 - 32 mmol/L   Glucose, Bld 107 (H) 70 - 99 mg/dL    Comment: Glucose reference range applies only to samples taken after fasting for at least 8 hours.   BUN 31 (H) 6 - 20 mg/dL   Creatinine, Ser 1.61 (H) 0.44 - 1.00 mg/dL   Calcium 9.5 8.9 - 09.6 mg/dL   Total Protein 8.1 6.5 - 8.1 g/dL   Albumin 4.0 3.5 - 5.0 g/dL   AST 14 (L) 15 - 41 U/L   ALT 20 0 - 44 U/L   Alkaline Phosphatase 87 38 - 126 U/L   Total Bilirubin 0.4 0.3 - 1.2 mg/dL   GFR, Estimated >04 >54 mL/min    Comment: (NOTE) Calculated using the  CKD-EPI Creatinine Equation (2021)    Anion gap 11 5 - 15    Comment: Performed at [facility], [address] [place], [place] [address]  CBC with Differential     Status: Abnormal   Collection Time: 10/11/22  7:03 PM  Result Value Ref Range   WBC 14.3 (H) 4.0 - 10.5 K/uL   RBC 5.16 (H) 3.87 - 5.11 MIL/uL   Hemoglobin 13.1 12.0 - 15.0 g/dL   HCT 91.4 78.2 - 95.6 %   MCV 80.8 80.0 - 100.0 fL   MCH 25.4 (L) 26.0 - 34.0 pg   MCHC 31.4 30.0 - 36.0 g/dL   RDW 21.3 (H) 08.6 - 57.8 %   Platelets 399 150 - 400 K/uL   nRBC 0.0 0.0 - 0.2 %   Neutrophils Relative % 65 %   Neutro Abs 9.4 (H) 1.7 - 7.7 K/uL   Lymphocytes Relative 25 %   Lymphs Abs 3.5 0.7 - 4.0 K/uL   Monocytes Relative 8 %   Monocytes Absolute 1.1 (H) 0.1 - 1.0 K/uL   Eosinophils Relative 1 %   Eosinophils Absolute 0.2 0.0 - 0.5 K/uL   Basophils Relative 0 %   Basophils Absolute 0.1 0.0 - 0.1 K/uL   Immature Granulocytes 1 %   Abs Immature Granulocytes 0.07 0.00 - 0.07 K/uL    Comment: Performed at [facility], [address] [place], [place] [address]  Lipase, blood     Status: Abnormal   Collection Time: 10/11/22  7:03 PM  Result Value Ref Range   Lipase 179 (H) 11 - 51 U/L    Comment: Performed at [facility], [address], [place], [place] [address]  Troponin I (High Sensitivity)     Status: None   Collection Time: 10/11/22  7:03 PM  Result Value Ref Range   Troponin I (High Sensitivity) 3 <18 ng/L    Comment: (NOTE) Elevated high sensitivity troponin I (hsTnI) values and significant  changes across serial measurements may suggest ACS but many other  chronic and acute conditions are known to elevate hsTnI results.  Refer to the "Links" section for chest pain algorithms and additional  guidance. Performed at [facility], [address], [place], [place] [address]   Troponin I (High Sensitivity)     Status: None   Collection  Time: 10/11/22 10:10 PM  Result  Value Ref Range   Troponin I (High Sensitivity) 3 <18 ng/L    Comment: (NOTE) Elevated high sensitivity troponin I (hsTnI) values and significant  changes across serial measurements may suggest ACS but many other  chronic and acute conditions are known to elevate hsTnI results.  Refer to the "Links" section for chest pain algorithms and additional  guidance. Performed at [facility], [address], [place], [place] [address]   Urinalysis, w/ Reflex to Culture (Infection Suspected) -Urine, Clean Catch     Status: Abnormal   Collection Time: 10/11/22 10:44 PM  Result Value Ref Range   Specimen Source URINE, CLEAN CATCH    Color, Urine YELLOW YELLOW   APPearance CLEAR CLEAR   Specific Gravity, Urine 1.011 1.005 - 1.030   pH 7.0 5.0 - 8.0   Glucose, UA NEGATIVE NEGATIVE mg/dL   Hgb urine dipstick NEGATIVE NEGATIVE   Bilirubin Urine NEGATIVE NEGATIVE   Ketones, ur NEGATIVE NEGATIVE mg/dL   Protein, ur NEGATIVE NEGATIVE mg/dL   Nitrite NEGATIVE NEGATIVE   Leukocytes,Ua SMALL (A) NEGATIVE   RBC / HPF 0-5 0 - 5 RBC/hpf   WBC, UA 6-10 0 - 5 WBC/hpf    Comment:        Reflex urine culture not performed if WBC <=10, OR if Squamous epithelial cells >5. If Squamous epithelial cells >5 suggest recollection.    Bacteria, UA MANY (A) NONE SEEN   Squamous Epithelial / HPF 0-5 0 - 5 /HPF    Comment: Performed at [facility], [address], [place], [place] [address]   CT ABDOMEN PELVIS WO CONTRAST  Result Date: 10/11/2022 CLINICAL DATA:  Nonbilious vomiting. 52 year old female. Concern for aspiration. EXAM: CT ABDOMEN AND PELVIS WITHOUT CONTRAST TECHNIQUE: Multidetector CT imaging of the abdomen and pelvis was performed following the standard protocol without IV contrast. RADIATION DOSE REDUCTION: This exam was performed according to the departmental dose-optimization program which includes automated exposure control,  adjustment of the mA and/or kV according to patient size and/or use of iterative reconstruction technique. COMPARISON:  CTs without contrast dated 07/04/2022 and 07/10/2021 FINDINGS: Lower chest: Sternotomy and old CABG changes with heavy native CAD particularly for age. There are calcifications in the aortic valve plane. The cardiac size is normal. There is no pericardial effusion. Small hiatal hernia. Linear scarring or atelectasis again seen in the lower lobes, right middle lobe but no acute lung base process. Hepatobiliary: No focal liver abnormality is seen without contrast. The gallbladder and bile ducts are unremarkable. Pancreas: Interval new finding of fatty stranding around the entire pancreas, development of mild generalized pancreatic edema. Findings consistent with acute interstitial pancreatitis. No pancreatic ductal dilatation is seen. No peripancreatic hemorrhage or abscess is seen. There is trace peripancreatic reactive fluid tracking slightly inferiorly. No pancreatic mass is seen without contrast. Spleen: No abnormality. Adrenals/Urinary Tract: Surgical clips from prior right adrenalectomy. Unremarkable left adrenal gland. There is no contour deforming abnormality of either kidney. Congenital malrotation of the right kidney. There is no evidence of urinary stones or obstruction. Unremarkable bladder. Stomach/Bowel: The stomach is contracted. There is a 2.8 cm diverticulum off the medial aspect of the second portion, containing debris and trace fluid. Rest of the small bowel is unremarkable and normal caliber. The appendix is normal and well seen. Mild-to-moderate fecal stasis ascending and transverse colon, left colonic diverticula without evidence of diverticulitis again noted. Vascular/Lymphatic: Extensive aortic and iliac/branch vessel atherosclerosis particularly for a 52 year old. No AAA. No lymphadenopathy is  seen. Reproductive: Status post hysterectomy. No adnexal masses. Other: Umbilical  rectus diastasis and outward protrusion is again noted with several small right of the midline fat hernias and a small midline umbilical fat hernia. There is a small right inguinal fat hernia. Small caliber small bowel segments extend into the area of midline outward protrusion at the umbilicus but there is no incarcerated hernia. There has no pelvic ascites. No free hemorrhage, free air or abscess. Musculoskeletal: Possibly associated with the patient's C-section scar, in the deep subcutaneous plane in the midline abutting the low anterior pelvic wall there is an old irregular masslike density measuring 3.5 x 2 cm on 2:91, unchanged. This could represent a fibroma, dense scar related to old hematoma, or an extra-abdominal endometrioma. In any case it is entirely stable. There is slight lumbar levoscoliosis, mild osteopenia. Mild degenerative change lumbar spine. IMPRESSION: 1. Acute interstitial pancreatitis without evidence of hemorrhage or abscess. Minimal nonlocalizing reactive fluid. 2. Constipation and diverticulosis. 3. Age advanced atherosclerosis.  Prior CABG. 4. Umbilical rectus diastasis and outward protrusion with several small right of the midline fat hernias and a small midline umbilical fat hernia. Small caliber small bowel segments extend into the area of midline outward protrusion at the umbilicus but there is no incarcerated hernia or SBO. 5. Small hiatal hernia. 6. Stable 3.5 x 2 cm irregular masslike density in the deep subcutaneous plane abutting the low anterior pelvic wall. This could represent a fibroma, dense scar such as related to old hematoma, or an extra-abdominal endometrioma. In any case it is entirely unchanged. Aortic Atherosclerosis (ICD10-I70.0). Electronically Signed   By: Almira Bar M.D.   On: 10/11/2022 21:51   DG Chest Portable 1 View  Result Date: 10/11/2022 CLINICAL DATA:  Vomiting with possible aspiration. EXAM: PORTABLE CHEST 1 VIEW COMPARISON:  October 06, 2022  FINDINGS: Limited study secondary to patient rotation. Multiple sternal wires and vascular clips are seen. The heart size and mediastinal contours are within normal limits. Low lung volumes are present without evidence of an acute infiltrate, pleural effusion or pneumothorax. The visualized skeletal structures are unremarkable. IMPRESSION: 1. Evidence of prior median sternotomy/CABG. 2. Low lung volumes without acute or active cardiopulmonary disease. Electronically Signed   By: Aram Candela M.D.   On: 10/11/2022 20:57    Pending Labs Unresulted Labs (From admission, onward)     Start     Ordered   10/12/22 0500  CBC  Tomorrow morning,   R        10/11/22 2229   10/12/22 0500  Comprehensive metabolic panel  Tomorrow morning,   R        10/11/22 2229   10/12/22 0500  Magnesium  Tomorrow morning,   R        10/11/22 2229   10/12/22 0500  Phosphorus  Tomorrow morning,   R        10/11/22 2229   10/12/22 0500  Lipase, blood  Tomorrow morning,   R        10/11/22 2229            Vitals/Pain Today's Vitals   10/11/22 1830 10/11/22 1840 10/11/22 2147 10/12/22 0130  BP:   (!) 152/90 (!) 182/107  Pulse:   96 99  Resp:   18 (!) 23  Temp:   98.8 F (37.1 C) 98.7 F (37.1 C)  TempSrc:   Oral Oral  SpO2:   99% 98%  Weight: 199 lb 15.3 oz (90.7 kg)  Height: 5\' 2"  (1.575 m)     PainSc:  4       Isolation Precautions No active isolations  Medications Medications  acetaminophen (TYLENOL) tablet 650 mg (has no administration in time range)  prochlorperazine (COMPAZINE) injection 5 mg (5 mg Intravenous Given 10/12/22 0036)  polyethylene glycol (MIRALAX / GLYCOLAX) packet 17 g (has no administration in time range)  melatonin tablet 5 mg (has no administration in time range)  lactated ringers infusion ( Intravenous New Bag/Given 10/12/22 0013)  albuterol (PROVENTIL) (2.5 MG/3ML) 0.083% nebulizer solution 2.5 mg (has no administration in time range)  cefTRIAXone (ROCEPHIN) 2 g in  sodium chloride 0.9 % 100 mL IVPB (has no administration in time range)  atorvastatin (LIPITOR) tablet 80 mg (has no administration in time range)  budesonide (PULMICORT) nebulizer solution 0.25 mg (has no administration in time range)  clopidogrel (PLAVIX) tablet 75 mg (has no administration in time range)  escitalopram (LEXAPRO) tablet 20 mg (has no administration in time range)  ezetimibe (ZETIA) tablet 10 mg (has no administration in time range)  methimazole (TAPAZOLE) tablet 5 mg (has no administration in time range)  pantoprazole (PROTONIX) EC tablet 40 mg (has no administration in time range)  rivaroxaban (XARELTO) tablet 10 mg (has no administration in time range)  tamsulosin (FLOMAX) capsule 0.4 mg (has no administration in time range)  ipratropium-albuterol (DUONEB) 0.5-2.5 (3) MG/3ML nebulizer solution 3 mL (has no administration in time range)  metoprolol tartrate (LOPRESSOR) tablet 25 mg (has no administration in time range)  lactated ringers bolus 1,000 mL (0 mLs Intravenous Stopped 10/11/22 2227)  ondansetron (ZOFRAN) injection 4 mg (4 mg Intravenous Given 10/11/22 2033)  metoCLOPramide (REGLAN) injection 10 mg (10 mg Intravenous Given 10/11/22 2144)    Mobility non-ambulatory

## 2022-10-12 NOTE — Hospital Course (Addendum)
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  She presented from home since then with epigastric pain, nausea vomiting and was covered in emesis with concern for aspiration.  She admitted the pain radiated to her back and EMS was called and she was brought to the [facility] hospital for further evaluation.  In the ED she was normotensive with tachycardia and O2 saturation of 97% room air.  Lab studies were evaluated and she was noted to have a leukocytosis and a noncontrast CT scan of the abdomen pelvis revealed findings suggestive of acute interstitial pancreatitis without any hemorrhage or abscess.  Assessment and Plan:  Acute Pancreatitis likely in the setting of Semaglutide  -Presented with epigastric pain radiated to her back, nausea and vomiting, lipase 179,  -CT findings suggestive of acute pancreatitis as it showed "Acute interstitial pancreatitis without evidence of hemorrhage or abscess. Minimal nonlocalizing reactive fluid. Constipation and diverticulosis. Age advanced atherosclerosis. Prior CABG. Umbilical rectus diastasis and outward protrusion with several small right of the midline fat hernias and a small midline umbilical fat hernia. Small caliber small bowel segments extend into the area of midline outward protrusion at the umbilicus but there is no  incarcerated hernia or SBO. Small hiatal hernia. Stable 3.5 x 2 cm irregular masslike density in the deep subcutaneous plane abutting the low anterior pelvic wall. This could represent a fibroma, dense scar such as related to old hematoma, or an extra-abdominal endometrioma. In any case it is entirely unchanged. Aortic Atherosclerosis." -Last triglyceride level 157. -No alcohol use. -Unclear etiology at this time and ? Medication induced vs Biliary origin IV fluid hydration IV antiemetics as needed -Lipase Level Trend: Recent Labs  Lab 10/11/22 1903 10/12/22 0714  LIPASE 179* 133*  -Pain control as needed with acetaminophen 650 g p.o. every 6 as needed for mild pain and fever, oxycodone 5 mg every 6 as needed for moderate pain and IV fentanyl 12.5 mcg - 25 mcg every 2 as needed severe pain -NPO except ice chips, sips and medications was initially initiated but now will go to clear liquid diet -Recommending to discontinue semaglutide at discharge given the package insert recommending cessation if pancreatitis is confirmed   Presumptive aspiration pneumonia -Getting Abx with IV Ceftriaxone  -Aspiration precautions. -CXR done and showed "Evidence of prior median sternotomy/CABG. Low lung volumes without acute or active cardiopulmonary disease." -Continuous pulse oximetry and maintain O2 saturation above 92%. -Continue supplemental oxygen via nasal cannula if necessary and wean O2 as tolerated -Continue with budesonide 0.25 mg nebs twice daily and albuterol 2.5 mg nebs every 6 as needed for wheezing or shortness of breath; continuing DuoNeb scheduled 3 mL twice daily -Check SLP -Check chest x-ray in a.m.   Coronary Artery Disease status post CABG -C/w Clopidogrel 75 mg po Daily, Atorvastatin 80 mg po Daily, and Metoprolol Tartrate 25 mg po BD -Continue with Ranolazine 500 g p.o. twice daily -Continue to Monitor for CP and Continue to Monitor on Telemetry  -  Recently admitted for chest pain  rule out ACS and was seen by cardiology on 10/09/2022 with recommendations to follow-up with her outpatient primary cardiologist  Chronic HFpEF 60 to 65% -C/w Metoprolol Tartrate 25 mg po BID -Strict I's and O's and Daily Weights;  Intake/Output Summary (Last 24 hours) at 10/12/2022 1226 Last data filed at 10/12/2022 0403 Gross per 24 hour  Intake 384.64 ml  Output 1300 ml  Net -915.36 ml  -Continue to Monitor for S/Sx of Volume Overload and repeat CXR in the AM   Insulin-Dependent Diabetes Mellitus Type 2 associated diabetic neuropathy -At home she takes Lantus 40 units subcu nightly, semaglutide 0.5 mg subcu into the skin once a week on Sundays, and Humalog 15 units subcu 3 times daily with meals -Will initiate Semglee 20 units subcu nightly and place on moderate NovoLog sliding scale insulin before meals and at bedtime with CBG checks -Resume home gabapentin 100 mg 3 times daily   QTc prolongation -Avoid QTc prolonging agents -Optimize magnesium level greater than 2.0 -Optimize potassium level greater than 4.0 -Continue to Monitor on telemetry; Avoid qTC prolonging Medications if possible; Have initiated IV Ondansetron now and will monitor closely   History of CVA with spastic hemiparesis -Resume home regimen with Atorvastatin 80 m pog Daily and Clopidogrel 75 mg po Daily as well as rivaroxaban 10 mg p.o. daily -PT OT assessment -Fall precautions   Renal Insufficiency -BUN/Cr Trend: Recent Labs  Lab 10/06/22 1520 10/07/22 0315 10/09/22 0529 10/11/22 1903 10/12/22 0714  BUN 21* 21* 23* 31* 24*  CREATININE 0.92 0.94 1.16* 1.09* 0.91  -C/w IVF with LR at 100 mL/hr -Avoid Nephrotoxic Medications, Contrast Dyes, Hypotension and Dehydration to Ensure Adequate Renal Perfusion and will need to Renally Adjust Meds -Continue to Monitor and Trend Renal Function carefully and repeat CMP in the AM   Hyperthyroidism -C/w Methimazole 5 mg po Daily   HLD -C/w Atorvastatin 80 mg po Daily  and Ezetimibe 10 mg po Daily   GERD/GI Prophylaxis -C/w Pantoprazole 40 mg po Daily   History of pheochromocytoma status post adrenalectomy  Hypoalbuminemia -Patient's Albumin Trend: Recent Labs  Lab 10/07/22 0315 10/11/22 1903 10/12/22 0714  ALBUMIN 2.7* 4.0 3.4*  -Continue to Monitor and Trend and repeat CMP in the AM  Obesity -Complicates overall prognosis and care -Estimated body mass index is 36.57 kg/m as calculated from the following:   Height as of this encounter: 5\' 2"  (1.575 m).   Weight as of this encounter: 90.7 kg.  -Weight Loss and Dietary Counseling given

## 2022-10-12 NOTE — Plan of Care (Signed)
  Problem: Education: Goal: Understanding of CV disease, CV risk reduction, and recovery process will improve Outcome: Progressing Goal: Individualized Educational Video(s) Outcome: Progressing   Problem: Cardiovascular: Goal: Ability to achieve and maintain adequate cardiovascular perfusion will improve Outcome: Progressing Goal: Vascular access site(s) Level 0-1 will be maintained Outcome: Progressing   Problem: Clinical Measurements: Goal: Will remain free from infection Outcome: Progressing Goal: Diagnostic test results will improve Outcome: Progressing

## 2022-10-12 NOTE — Progress Notes (Signed)
   10/12/22 0352  Assess: MEWS Score  Temp 98.5 F (36.9 C)  BP (!) 157/102  MAP (mmHg) 119  Pulse Rate (!) 116  Resp 19  SpO2 98 %  O2 Device Room Air  Assess: MEWS Score  MEWS Temp 0  MEWS Systolic 0  MEWS Pulse 2  MEWS RR 0  MEWS LOC 0  MEWS Score 2  MEWS Score Color Yellow  Assess: if the MEWS score is Yellow or Red  Were vital signs accurate and taken at a resting state? Yes  Does the patient meet 2 or more of the SIRS criteria? No  MEWS guidelines implemented  Yes, yellow  Treat  MEWS Interventions Considered administering scheduled or prn medications/treatments as ordered  Take Vital Signs  Increase Vital Sign Frequency  Yellow: Q2hr x1, continue Q4hrs until patient remains green for 12hrs  Escalate  MEWS: Escalate Yellow: Discuss with charge nurse and consider notifying provider and/or RRT  Notify: Charge Nurse/RN  Name of Charge Nurse/RN Notified Museum/gallery curator  Provider Notification  Provider Name/Title Raphael Gibney  Date Provider Notified 10/12/22  Time Provider Notified 0400  Method of Notification Page  Notification Reason Other (Comment) (HR high)  Provider response No new orders  Date of Provider Response 10/12/22  Time of Provider Response 0400  Assess: SIRS CRITERIA  SIRS Temperature  0  SIRS Pulse 1  SIRS Respirations  0  SIRS WBC 0  SIRS Score Sum  1

## 2022-10-12 NOTE — Evaluation (Signed)
Physical Therapy Evaluation Patient Details Name: Lori [name] MRN: [id number] DOB: [birth year] Today's Date: 10/12/2022  History of Present Illness  52 y/o female admitted with acute pancreatitis. Recent admit 9/1-9/4 for chest pain per pt and chart review. PMHx: DM, obesity, CVA with spastic hemiplegia, hyperthyroidism, HTN, CAD, anxiety and depression, HLD, GERD, aphasia, AKI, polypharmacy, AMS.  Clinical Impression  On eval, pt required Mod A for bed mobility. She sat EOB for ~ 5 minutes with Min A . Pt presents with general weakness, decreased activity tolerance, and impaired gait balance. She has chronic L side hemiparesis from prior CVA. She reports continued abd pain and not feeling well. Discussed d/c plan -she plans to return home. Recommend TOC consult-will need to confirm, with family and home health agency, whether current level of in home care remains sufficient.      If plan is discharge home, recommend the following: Assist for transportation;Assistance with feeding;Assistance with cooking/housework;Help with stairs or ramp for entrance;A lot of help with walking and/or transfers;A lot of help with bathing/dressing/bathroom   Can travel by private vehicle        Equipment Recommendations None recommended by PT  Recommendations for Other Services       Functional Status Assessment Patient has had a recent decline in their functional status and/or demonstrates limited ability to make significant improvements in function in a reasonable and predictable amount of time     Precautions / Restrictions Precautions Precautions: Fall Precaution Comments: L hemiparesis; LE buckling Restrictions Weight Bearing Restrictions: No      Mobility  Bed Mobility Overal bed mobility: Needs Assistance Bed Mobility: Supine to Sit, Sit to Supine     Supine to sit: Mod assist, HOB elevated, Used rails Sit to supine: Mod assist, HOB elevated, Used rails   General bed mobility  comments: Assist for LEs and trunk. Increased time. Utilized bedpad to assist with positioning at EOB. Pt sat EOB for ~5 minutes with Min A for static sitting balance. Pt requested back to bed-assisted and positioned to comfort    Transfers                        Ambulation/Gait                  Stairs            Wheelchair Mobility     Tilt Bed    Modified Rankin (Stroke Patients Only)       Balance                                             Pertinent Vitals/Pain Pain Assessment Pain Assessment: Faces Faces Pain Scale: Hurts even more Pain Location: abdomen Pain Descriptors / Indicators: Discomfort Pain Intervention(s): Limited activity within patient's tolerance, Monitored during session, Repositioned    Home Living Family/patient expects to be discharged to:: Private residence Living Arrangements: Spouse/significant other Available Help at Discharge: Family;Available PRN/intermittently;Personal care attendant Type of Home: Apartment Home Access: Stairs to enter   Entrance Stairs-Number of Steps: 5   Home Layout: One level Home Equipment: Rollator (4 wheels);Hand held shower head;Hospital bed;Shower seat;BSC/3in1;Wheelchair - manual Additional Comments: Aide daily for 3 hours/day. pt's husband works during the day. reports being evaluated for power wheelchair and CAPS program    Prior Function Prior Level of Function : Needs assist  Mobility Comments: per pt, she typically uses wheelchair for mobility, light assist for transfers. reports walking some with therapy ADLs Comments: reports aide assists with "everything" in regards to ADLs. Pt reports aide assists her back to bed after ADL assist where she remains all day while husband at work. has purewick system at home that she uses in bed. reports working with [facility] therapies     Extremity/Trunk Assessment   Upper Extremity Assessment Upper Extremity  Assessment: Defer to OT evaluation    Lower Extremity Assessment Lower Extremity Assessment: Generalized weakness (chronic L hemiparesis)    Cervical / Trunk Assessment Cervical / Trunk Assessment: Kyphotic  Communication      Cognition Arousal: Alert Behavior During Therapy: WFL for tasks assessed/performed Overall Cognitive Status: Within Functional Limits for tasks assessed                                 General Comments: Expressive difficulties        General Comments      Exercises     Assessment/Plan    PT Assessment Patient needs continued PT services  PT Problem List Decreased strength;Decreased range of motion;Decreased activity tolerance;Decreased balance;Decreased mobility;Pain;Decreased knowledge of use of DME;Obesity       PT Treatment Interventions DME instruction;Gait training;Functional mobility training;Therapeutic activities;Therapeutic exercise;Patient/family education;Balance training    PT Goals (Current goals can be found in the Care Plan section)  Acute Rehab PT Goals Patient Stated Goal: return home once medically better PT Goal Formulation: With patient Time For Goal Achievement: 10/26/22 Potential to Achieve Goals: Fair    Frequency Min 1X/week     Co-evaluation               AM-PAC PT "6 Clicks" Mobility  Outcome Measure Help needed turning from your back to your side while in a flat bed without using bedrails?: A Lot Help needed moving from lying on your back to sitting on the side of a flat bed without using bedrails?: A Lot Help needed moving to and from a bed to a chair (including a wheelchair)?: Total Help needed standing up from a chair using your arms (e.g., wheelchair or bedside chair)?: Total Help needed to walk in hospital room?: Total Help needed climbing 3-5 steps with a railing? : Total 6 Click Score: 8    End of Session   Activity Tolerance: Patient tolerated treatment well;Patient limited by  pain;Patient limited by fatigue Patient left: in bed;with call bell/phone within reach;with bed alarm set   PT Visit Diagnosis: Pain;Hemiplegia and hemiparesis;Muscle weakness (generalized) (M62.81);History of falling (Z91.81) Hemiplegia - Right/Left: Left    Time: 1610-9604 PT Time Calculation (min) (ACUTE ONLY): 16 min   Charges:   PT Evaluation $PT Eval Low Complexity: 1 Low   PT General Charges $$ ACUTE PT VISIT: 1 Visit           Faye Ramsay, PT Acute Rehabilitation  Office: [phone]

## 2022-10-12 NOTE — Progress Notes (Signed)
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  She presented from home since then with epigastric pain, nausea vomiting and was covered in emesis with concern for aspiration.  She admitted the pain radiated to her back and EMS was called and she was brought to the [facility] hospital for further evaluation.  In the ED she was normotensive with tachycardia and O2 saturation of 97% room air.  Lab studies were evaluated and she was noted to have a leukocytosis and a noncontrast CT scan of the abdomen pelvis revealed findings suggestive of acute interstitial pancreatitis without any hemorrhage or abscess.  Assessment and Plan:  Acute Pancreatitis likely in the setting of Semaglutide  -Presented with epigastric pain radiated to her back, nausea and vomiting, lipase 179,  -CT findings suggestive of acute pancreatitis as it showed "Acute interstitial pancreatitis without evidence of hemorrhage or abscess. Minimal nonlocalizing reactive fluid. Constipation and diverticulosis. Age advanced atherosclerosis. Prior CABG. Umbilical rectus diastasis and outward protrusion with several small right of the midline fat hernias and a small midline  umbilical fat hernia. Small caliber small bowel segments extend into the area of midline outward protrusion at the umbilicus but there is no incarcerated hernia or SBO. Small hiatal hernia. Stable 3.5 x 2 cm irregular masslike density in the deep subcutaneous plane abutting the low anterior pelvic wall. This could represent a fibroma, dense scar such as related to old hematoma, or an extra-abdominal endometrioma. In any case it is entirely unchanged. Aortic Atherosclerosis." -Last triglyceride level 157. -No alcohol use. -Unclear etiology at this time and ? Medication induced vs Biliary origin IV fluid hydration IV antiemetics as needed -Lipase Level Trend: Recent Labs  Lab 10/11/22 1903 10/12/22 0714  LIPASE 179* 133*  -Pain control as needed with acetaminophen 650 g p.o. every 6 as needed for mild pain and fever, oxycodone 5 mg every 6 as needed for moderate pain and IV fentanyl 12.5 mcg - 25 mcg every 2 as needed severe pain -NPO except ice chips, sips and medications was initially initiated but now will go to clear liquid diet -Recommending to discontinue semaglutide at discharge given the package insert recommending cessation if pancreatitis is confirmed   Presumptive aspiration pneumonia -Getting Abx with IV Ceftriaxone  -Aspiration precautions. -CXR done and showed "Evidence of prior median sternotomy/CABG. Low lung volumes without acute or active cardiopulmonary disease." -Continuous pulse oximetry and maintain O2 saturation above 92%. -Continue supplemental oxygen via nasal cannula if necessary and wean O2 as tolerated -Continue with budesonide 0.25 mg nebs twice daily and albuterol 2.5 mg nebs every 6 as needed for wheezing or shortness of breath; continuing DuoNeb scheduled 3 mL twice daily -Check SLP -Check chest x-ray in a.m.   Coronary Artery Disease status post CABG -C/w Clopidogrel 75 mg po Daily, Atorvastatin 80 mg po Daily, and Metoprolol  Tartrate 25 mg po BD -Continue  with Ranolazine 500 g p.o. twice daily -Continue to Monitor for CP and Continue to Monitor on Telemetry  -Recently admitted for chest pain rule out ACS and was seen by cardiology on 10/09/2022 with recommendations to follow-up with her outpatient primary cardiologist  Chronic HFpEF 60 to 65% -C/w Metoprolol Tartrate 25 mg po BID -Strict I's and O's and Daily Weights;  Intake/Output Summary (Last 24 hours) at 10/12/2022 1226 Last data filed at 10/12/2022 0403 Gross per 24 hour  Intake 384.64 ml  Output 1300 ml  Net -915.36 ml  -Continue to Monitor for S/Sx of Volume Overload and repeat CXR in the AM   Insulin-Dependent Diabetes Mellitus Type 2 associated diabetic neuropathy -At home she takes Lantus 40 units subcu nightly, semaglutide 0.5 mg subcu into the skin once a week on Sundays, and Humalog 15 units subcu 3 times daily with meals -Will initiate Semglee 20 units subcu nightly and place on moderate NovoLog sliding scale insulin before meals and at bedtime with CBG checks -Resume home gabapentin 100 mg 3 times daily   QTc prolongation -Avoid QTc prolonging agents -Optimize magnesium level greater than 2.0 -Optimize potassium level greater than 4.0 -Continue to Monitor on telemetry; Avoid qTC prolonging Medications if possible; Have initiated IV Ondansetron now and will monitor closely   History of CVA with spastic hemiparesis -Resume home regimen with Atorvastatin 80 m pog Daily and Clopidogrel 75 mg po Daily as well as rivaroxaban 10 mg p.o. daily -PT OT assessment -Fall precautions   Renal Insufficiency -BUN/Cr Trend: Recent Labs  Lab 10/06/22 1520 10/07/22 0315 10/09/22 0529 10/11/22 1903 10/12/22 0714  BUN 21* 21* 23* 31* 24*  CREATININE 0.92 0.94 1.16* 1.09* 0.91  -C/w IVF with LR at 100 mL/hr -Avoid Nephrotoxic Medications, Contrast Dyes, Hypotension and Dehydration to Ensure Adequate Renal Perfusion and will need to Renally Adjust Meds -Continue to Monitor and Trend  Renal Function carefully and repeat CMP in the AM   Hyperthyroidism -C/w Methimazole 5 mg po Daily   HLD -C/w Atorvastatin 80 mg po Daily and Ezetimibe 10 mg po Daily   GERD/GI Prophylaxis -C/w Pantoprazole 40 mg po Daily   History of pheochromocytoma status post adrenalectomy  Hypoalbuminemia -Patient's Albumin Trend: Recent Labs  Lab 10/07/22 0315 10/11/22 1903 10/12/22 0714  ALBUMIN 2.7* 4.0 3.4*  -Continue to Monitor and Trend and repeat CMP in the AM  Obesity -Complicates overall prognosis and care -Estimated body mass index is 36.57 kg/m as calculated from the following:   Height as of this encounter: 5\' 2"  (1.575 m).   Weight as of this encounter: 90.7 kg.  -Weight Loss and Dietary Counseling given   DVT prophylaxis: rivaroxaban (XARELTO) tablet 10 mg Start: 10/12/22 1000 rivaroxaban (XARELTO) tablet 10 mg    Code Status: Full Code Family Communication: No family present at bedside  Disposition Plan:  Level of care: Telemetry Status is: Inpatient Remains inpatient appropriate because: Needs further clinical improvement in symptoms and tolerance of Diet   Consultants:  None  Procedures:  As delineated as above  Antimicrobials:  Anti-infectives (From admission, onward)    Start     Dose/Rate Route Frequency Ordered Stop   10/12/22 0145  cefTRIAXone (ROCEPHIN) 2 g in sodium chloride 0.9 % 100 mL IVPB        2 g 200 mL/hr over 30 Minutes Intravenous Daily at bedtime 10/12/22 0046         Subjective: Seen and examined at bedside  and patient was still complaining some abdominal discomfort but was willing to try a clear liquid diet.  She continues to have some nausea.  No lightheadedness or dizziness.  No other concerns or complaints at this time.  Objective: Vitals:   10/12/22 0200 10/12/22 0352 10/12/22 0600 10/12/22 1027  BP: (!) 131/113 (!) 157/102 (!) 155/101 (!) 136/92  Pulse: (!) 105 (!) 116 (!) 125 (!) 129  Resp: 15 19 19  (!) 23  Temp:  98.5  F (36.9 C) 99.3 F (37.4 C) 99.8 F (37.7 C)  TempSrc:    Oral  SpO2: 98% 98% 98% 96%  Weight:      Height:        Intake/Output Summary (Last 24 hours) at 10/12/2022 1251 Last data filed at 10/12/2022 0403 Gross per 24 hour  Intake 384.64 ml  Output 1300 ml  Net -915.36 ml   Filed Weights   10/11/22 1830  Weight: 90.7 kg   Examination: Physical Exam:  Constitutional: WN/WD obese female who appears slightly uncomfortable Respiratory: Diminished to auscultation bilaterally with some coarse breath sounds, no wheezing, rales, rhonchi or crackles. Normal respiratory effort and patient is not tachypenic. No accessory muscle use.  Unlabored breathing Cardiovascular: RRR, no murmurs / rubs / gallops. S1 and S2 auscultated. No extremity edema.  Abdomen: Soft, tender to palpate and distended secondary body habitus. Bowel sounds positive.  GU: Deferred. Musculoskeletal: No clubbing / cyanosis of digits/nails. No joint deformity upper and lower extremities. Skin: No rashes, lesions, ulcers on a limited skin evaluation. No induration; Warm and dry.  Neurologic: CN 2-12 grossly intact with no focal deficits but does have some aphasia and has some left-sided weakness and hemiparesis. Romberg sign and cerebellar reflexes not assessed.  Psychiatric: Normal judgment and insight. Alert and oriented x 3. Normal mood and appropriate affect.   Data Reviewed: I have personally reviewed following labs and imaging studies  CBC: Recent Labs  Lab 10/06/22 1520 10/07/22 0315 10/09/22 0529 10/11/22 1903 10/12/22 0714  WBC 8.7 8.3 8.6 14.3* 15.0*  NEUTROABS  --   --   --  9.4*  --   HGB 12.2 11.3* 11.4* 13.1 12.1  HCT 38.3 35.0* 35.6* 41.7 36.4  MCV 78.6* 78.1* 78.2* 80.8 78.1*  PLT 373 333 321 399 371   Basic Metabolic Panel: Recent Labs  Lab 10/06/22 1520 10/07/22 0315 10/09/22 0529 10/11/22 1903 10/12/22 0714  NA 136 136 134* 138 135  K 4.0 3.8 4.3 3.8 3.7  CL 101 102 102 101 99  CO2  24 21* 20* 26 23  GLUCOSE 256* 207* 293* 107* 183*  BUN 21* 21* 23* 31* 24*  CREATININE 0.92 0.94 1.16* 1.09* 0.91  CALCIUM 9.1 8.8* 8.9 9.5 8.9  MG  --   --   --   --  1.8  PHOS  --   --   --   --  3.2   GFR: Estimated Creatinine Clearance: 78.3 mL/min (by C-G formula based on SCr of 0.91 mg/dL). Liver Function Tests: Recent Labs  Lab 10/07/22 0315 10/11/22 1903 10/12/22 0714  AST 14* 14* 14*  ALT 17 20 18   ALKPHOS 80 87 77  BILITOT 0.4 0.4 0.5  PROT 6.1* 8.1 6.9  ALBUMIN 2.7* 4.0 3.4*   Recent Labs  Lab 10/11/22 1903 10/12/22 0714  LIPASE 179* 133*   No results for input(s): "AMMONIA" in the last 168 hours. Coagulation Profile: No results for input(s): "INR", "PROTIME" in the last 168 hours. Cardiac Enzymes:  No results for input(s): "CKTOTAL", "CKMB", "CKMBINDEX", "TROPONINI" in the last 168 hours. BNP (last 3 results) No results for input(s): "PROBNP" in the last 8760 hours. HbA1C: No results for input(s): "HGBA1C" in the last 72 hours. CBG: Recent Labs  Lab 10/08/22 0758 10/08/22 1206 10/08/22 1700 10/08/22 2009 10/09/22 0804  GLUCAP 136* 119* 107* 105* 329*   Lipid Profile: No results for input(s): "CHOL", "HDL", "LDLCALC", "TRIG", "CHOLHDL", "LDLDIRECT" in the last 72 hours. Thyroid Function Tests: No results for input(s): "TSH", "T4TOTAL", "FREET4", "T3FREE", "THYROIDAB" in the last 72 hours. Anemia Panel: No results for input(s): "VITAMINB12", "FOLATE", "FERRITIN", "TIBC", "IRON", "RETICCTPCT" in the last 72 hours. Sepsis Labs: No results for input(s): "PROCALCITON", "LATICACIDVEN" in the last 168 hours.  Recent Results (from the past 240 hour(s))  Resp panel by RT-PCR (RSV, Flu A&B, Covid) Anterior Nasal Swab     Status: None   Collection Time: 10/06/22  4:27 PM   Specimen: Anterior Nasal Swab  Result Value Ref Range Status   SARS Coronavirus 2 by RT PCR NEGATIVE NEGATIVE Final   Influenza A by PCR NEGATIVE NEGATIVE Final   Influenza B by PCR  NEGATIVE NEGATIVE Final    Comment: (NOTE) The Xpert Xpress SARS-CoV-2/FLU/RSV plus assay is intended as an aid in the diagnosis of influenza from Nasopharyngeal swab specimens and should not be used as a sole basis for treatment. Nasal washings and aspirates are unacceptable for Xpert Xpress SARS-CoV-2/FLU/RSV testing.  Fact Sheet for Patients: BloggerCourse.com  Fact Sheet for Healthcare Providers: SeriousBroker.it  This test is not yet approved or cleared by the [place] FDA and has been authorized for detection and/or diagnosis of SARS-CoV-2 by FDA under an Emergency Use Authorization (EUA). This EUA will remain in effect (meaning this test can be used) for the duration of the COVID-19 declaration under Section 564(b)(1) of the Act, 21 U.S.C. section 360bbb-3(b)(1), unless the authorization is terminated or revoked.     Resp Syncytial Virus by PCR NEGATIVE NEGATIVE Final    Comment: (NOTE) Fact Sheet for Patients: BloggerCourse.com  Fact Sheet for Healthcare Providers: SeriousBroker.it  This test is not yet approved or cleared by the [place] FDA and has been authorized for detection and/or diagnosis of SARS-CoV-2 by FDA under an Emergency Use Authorization (EUA). This EUA will remain in effect (meaning this test can be used) for the duration of the COVID-19 declaration under Section 564(b)(1) of the Act, 21 U.S.C. section 360bbb-3(b)(1), unless the authorization is terminated or revoked.  Performed at [facility], [address] [place], [place] [address]     Radiology Studies: CT ABDOMEN PELVIS WO CONTRAST  Result Date: 10/11/2022 CLINICAL DATA:  Nonbilious vomiting. 52 year old female. Concern for aspiration. EXAM: CT ABDOMEN AND PELVIS WITHOUT CONTRAST TECHNIQUE: Multidetector CT imaging of the abdomen and pelvis was performed following the standard  protocol without IV contrast. RADIATION DOSE REDUCTION: This exam was performed according to the departmental dose-optimization program which includes automated exposure control, adjustment of the mA and/or kV according to patient size and/or use of iterative reconstruction technique. COMPARISON:  CTs without contrast dated 07/04/2022 and 07/10/2021 FINDINGS: Lower chest: Sternotomy and old CABG changes with heavy native CAD particularly for age. There are calcifications in the aortic valve plane. The cardiac size is normal. There is no pericardial effusion. Small hiatal hernia. Linear scarring or atelectasis again seen in the lower lobes, right middle lobe but no acute lung base process. Hepatobiliary: No focal liver abnormality is seen without contrast. The  gallbladder and bile ducts are unremarkable. Pancreas: Interval new finding of fatty stranding around the entire pancreas, development of mild generalized pancreatic edema. Findings consistent with acute interstitial pancreatitis. No pancreatic ductal dilatation is seen. No peripancreatic hemorrhage or abscess is seen. There is trace peripancreatic reactive fluid tracking slightly inferiorly. No pancreatic mass is seen without contrast. Spleen: No abnormality. Adrenals/Urinary Tract: Surgical clips from prior right adrenalectomy. Unremarkable left adrenal gland. There is no contour deforming abnormality of either kidney. Congenital malrotation of the right kidney. There is no evidence of urinary stones or obstruction. Unremarkable bladder. Stomach/Bowel: The stomach is contracted. There is a 2.8 cm diverticulum off the medial aspect of the second portion, containing debris and trace fluid. Rest of the small bowel is unremarkable and normal caliber. The appendix is normal and well seen. Mild-to-moderate fecal stasis ascending and transverse colon, left colonic diverticula without evidence of diverticulitis again noted. Vascular/Lymphatic: Extensive aortic and  iliac/branch vessel atherosclerosis particularly for a 52 year old. No AAA. No lymphadenopathy is seen. Reproductive: Status post hysterectomy. No adnexal masses. Other: Umbilical rectus diastasis and outward protrusion is again noted with several small right of the midline fat hernias and a small midline umbilical fat hernia. There is a small right inguinal fat hernia. Small caliber small bowel segments extend into the area of midline outward protrusion at the umbilicus but there is no incarcerated hernia. There has no pelvic ascites. No free hemorrhage, free air or abscess. Musculoskeletal: Possibly associated with the patient's C-section scar, in the deep subcutaneous plane in the midline abutting the low anterior pelvic wall there is an old irregular masslike density measuring 3.5 x 2 cm on 2:91, unchanged. This could represent a fibroma, dense scar related to old hematoma, or an extra-abdominal endometrioma. In any case it is entirely stable. There is slight lumbar levoscoliosis, mild osteopenia. Mild degenerative change lumbar spine. IMPRESSION: 1. Acute interstitial pancreatitis without evidence of hemorrhage or abscess. Minimal nonlocalizing reactive fluid. 2. Constipation and diverticulosis. 3. Age advanced atherosclerosis.  Prior CABG. 4. Umbilical rectus diastasis and outward protrusion with several small right of the midline fat hernias and a small midline umbilical fat hernia. Small caliber small bowel segments extend into the area of midline outward protrusion at the umbilicus but there is no incarcerated hernia or SBO. 5. Small hiatal hernia. 6. Stable 3.5 x 2 cm irregular masslike density in the deep subcutaneous plane abutting the low anterior pelvic wall. This could represent a fibroma, dense scar such as related to old hematoma, or an extra-abdominal endometrioma. In any case it is entirely unchanged. Aortic Atherosclerosis (ICD10-I70.0). Electronically Signed   By: Almira Bar M.D.   On:  10/11/2022 21:51   DG Chest Portable 1 View  Result Date: 10/11/2022 CLINICAL DATA:  Vomiting with possible aspiration. EXAM: PORTABLE CHEST 1 VIEW COMPARISON:  October 06, 2022 FINDINGS: Limited study secondary to patient rotation. Multiple sternal wires and vascular clips are seen. The heart size and mediastinal contours are within normal limits. Low lung volumes are present without evidence of an acute infiltrate, pleural effusion or pneumothorax. The visualized skeletal structures are unremarkable. IMPRESSION: 1. Evidence of prior median sternotomy/CABG. 2. Low lung volumes without acute or active cardiopulmonary disease. Electronically Signed   By: Aram Candela M.D.   On: 10/11/2022 20:57    Scheduled Meds:  atorvastatin  80 mg Oral Daily   budesonide  0.25 mg Nebulization BID   clopidogrel  75 mg Oral Daily   escitalopram  20 mg Oral q  morning   ezetimibe  10 mg Oral Daily   gabapentin  100 mg Oral TID   insulin aspart  0-15 Units Subcutaneous TID WC   insulin aspart  0-5 Units Subcutaneous QHS   insulin glargine-yfgn  20 Units Subcutaneous QHS   ipratropium-albuterol  3 mL Nebulization BID   methimazole  5 mg Oral q morning   metoprolol tartrate  25 mg Oral BID   pantoprazole  40 mg Oral Daily   ranolazine  500 mg Oral BID   rivaroxaban  10 mg Oral Daily   tamsulosin  0.4 mg Oral QHS   Continuous Infusions:  cefTRIAXone (ROCEPHIN)  IV 2 g (10/12/22 0403)   lactated ringers     magnesium sulfate bolus IVPB 2 g (10/12/22 1246)    LOS: 1 day   Marguerita Merles, DO [facility] Available via Epic secure chat 7am-7pm After these hours, please refer to coverage provider listed on [email or web address] 10/12/2022, 12:51 PM

## 2022-10-13 ENCOUNTER — Inpatient Hospital Stay (HOSPITAL_COMMUNITY): Payer: 59

## 2022-10-13 DIAGNOSIS — Z8673 Personal history of transient ischemic attack (TIA), and cerebral infarction without residual deficits: Secondary | ICD-10-CM

## 2022-10-13 DIAGNOSIS — F32A Depression, unspecified: Secondary | ICD-10-CM

## 2022-10-13 DIAGNOSIS — K859 Acute pancreatitis without necrosis or infection, unspecified: Secondary | ICD-10-CM

## 2022-10-13 DIAGNOSIS — F419 Anxiety disorder, unspecified: Secondary | ICD-10-CM

## 2022-10-13 DIAGNOSIS — E119 Type 2 diabetes mellitus without complications: Secondary | ICD-10-CM | POA: Diagnosis not present

## 2022-10-13 DIAGNOSIS — E7849 Other hyperlipidemia: Secondary | ICD-10-CM

## 2022-10-13 DIAGNOSIS — J690 Pneumonitis due to inhalation of food and vomit: Secondary | ICD-10-CM | POA: Insufficient documentation

## 2022-10-13 DIAGNOSIS — E66811 Obesity, class 1: Secondary | ICD-10-CM | POA: Insufficient documentation

## 2022-10-13 DIAGNOSIS — I5032 Chronic diastolic (congestive) heart failure: Secondary | ICD-10-CM | POA: Insufficient documentation

## 2022-10-13 DIAGNOSIS — I10 Essential (primary) hypertension: Secondary | ICD-10-CM | POA: Diagnosis not present

## 2022-10-13 DIAGNOSIS — D649 Anemia, unspecified: Secondary | ICD-10-CM | POA: Insufficient documentation

## 2022-10-13 DIAGNOSIS — Z794 Long term (current) use of insulin: Secondary | ICD-10-CM

## 2022-10-13 HISTORY — DX: Pneumonitis due to inhalation of food and vomit: J69.0

## 2022-10-13 LAB — COMPREHENSIVE METABOLIC PANEL
ALT: 13 U/L (ref 0–44)
AST: 10 U/L — ABNORMAL LOW (ref 15–41)
Albumin: 2.9 g/dL — ABNORMAL LOW (ref 3.5–5.0)
Alkaline Phosphatase: 60 U/L (ref 38–126)
Anion gap: 9 (ref 5–15)
BUN: 17 mg/dL (ref 6–20)
CO2: 23 mmol/L (ref 22–32)
Calcium: 8.3 mg/dL — ABNORMAL LOW (ref 8.9–10.3)
Chloride: 102 mmol/L (ref 98–111)
Creatinine, Ser: 0.81 mg/dL (ref 0.44–1.00)
GFR, Estimated: >60 mL/min (ref >60)
Glucose, Bld: 102 mg/dL — ABNORMAL HIGH (ref 70–99)
Potassium: 3.6 mmol/L (ref 3.5–5.1)
Sodium: 134 mmol/L — ABNORMAL LOW (ref 135–145)
Total Bilirubin: 0.8 mg/dL (ref 0.3–1.2)
Total Protein: 5.7 g/dL — ABNORMAL LOW (ref 6.5–8.1)

## 2022-10-13 LAB — CBC WITH DIFFERENTIAL/PLATELET
Abs Immature Granulocytes: 0.03 10*3/uL (ref 0.00–0.07)
Basophils Absolute: 0.1 10*3/uL (ref 0.0–0.1)
Basophils Relative: 0 %
Eosinophils Absolute: 0.4 10*3/uL (ref 0.0–0.5)
Eosinophils Relative: 3 %
HCT: 31.9 % — ABNORMAL LOW (ref 36.0–46.0)
Hemoglobin: 10.2 g/dL — ABNORMAL LOW (ref 12.0–15.0)
Immature Granulocytes: 0 %
Lymphocytes Relative: 27 %
Lymphs Abs: 3.2 10*3/uL (ref 0.7–4.0)
MCH: 25.4 pg — ABNORMAL LOW (ref 26.0–34.0)
MCHC: 32 g/dL (ref 30.0–36.0)
MCV: 79.6 fL — ABNORMAL LOW (ref 80.0–100.0)
Monocytes Absolute: 1 10*3/uL (ref 0.1–1.0)
Monocytes Relative: 9 %
Neutro Abs: 7.5 10*3/uL (ref 1.7–7.7)
Neutrophils Relative %: 61 %
Platelets: 300 10*3/uL (ref 150–400)
RBC: 4.01 MIL/uL (ref 3.87–5.11)
RDW: 17.2 % — ABNORMAL HIGH (ref 11.5–15.5)
WBC: 12.2 10*3/uL — ABNORMAL HIGH (ref 4.0–10.5)
nRBC: 0 % (ref 0.0–0.2)

## 2022-10-13 LAB — GLUCOSE, CAPILLARY
Glucose-Capillary: 102 mg/dL — ABNORMAL HIGH (ref 70–99)
Glucose-Capillary: 180 mg/dL — ABNORMAL HIGH (ref 70–99)
Glucose-Capillary: 153 mg/dL — ABNORMAL HIGH (ref 70–99)
Glucose-Capillary: 183 mg/dL — ABNORMAL HIGH (ref 70–99)

## 2022-10-13 LAB — PHOSPHORUS: Phosphorus: 3.9 mg/dL (ref 2.5–4.6)

## 2022-10-13 LAB — MAGNESIUM: Magnesium: 2.1 mg/dL (ref 1.7–2.4)

## 2022-10-13 MED ORDER — TOPIRAMATE 25 MG PO TABS
25.0000 mg | ORAL_TABLET | Freq: Two times a day (BID) | ORAL | Status: DC
  Administered 2022-10-13 – 2022-10-16 (×7): 25 mg via ORAL
  Filled 2022-10-13 (×8): qty 1

## 2022-10-13 MED ORDER — LORAZEPAM 0.5 MG PO TABS
0.5000 mg | ORAL_TABLET | Freq: Every day | ORAL | Status: DC
  Administered 2022-10-13 – 2022-10-16 (×4): 0.5 mg via ORAL
  Filled 2022-10-13 (×4): qty 1

## 2022-10-13 MED ORDER — POLYETHYLENE GLYCOL 3350 17 G PO PACK
17.0000 g | PACK | Freq: Every day | ORAL | Status: DC
  Administered 2022-10-13 – 2022-10-16 (×4): 17 g via ORAL
  Filled 2022-10-13 (×4): qty 1

## 2022-10-13 MED ORDER — DICLOFENAC SODIUM 1 % EX GEL
4.0000 g | Freq: Four times a day (QID) | CUTANEOUS | Status: DC
  Administered 2022-10-14 – 2022-10-16 (×9): 4 g via TOPICAL
  Filled 2022-10-13: qty 100

## 2022-10-13 MED ORDER — CYCLOBENZAPRINE HCL 5 MG PO TABS
5.0000 mg | ORAL_TABLET | Freq: Three times a day (TID) | ORAL | Status: DC | PRN
  Administered 2022-10-14: 5 mg via ORAL
  Filled 2022-10-13 (×2): qty 1

## 2022-10-13 MED ORDER — BACLOFEN 10 MG PO TABS
10.0000 mg | ORAL_TABLET | Freq: Three times a day (TID) | ORAL | Status: DC
  Administered 2022-10-13 – 2022-10-16 (×10): 10 mg via ORAL
  Filled 2022-10-13 (×11): qty 1

## 2022-10-13 NOTE — Assessment & Plan Note (Signed)
LFTs normal, CT imaging shows no evidence of gallstones, CBD dilation, doubt gallstone pancreatitis.  There is some question whether this is related to semaglutide.  Certainly not alcohol.  Seems to be improving, epigastric pain is improved, no nausea with clears - Advance to full liquids - Hold IV fluids

## 2022-10-13 NOTE — Progress Notes (Signed)
  Progress Note   Patient: Lori [name] ZOX:[id number] DOB: 1970-11-20 DOA: 10/11/2022     2 DOS: the patient was seen and examined on 10/13/2022 at 11:20 AM      Brief hospital course: Mrs. Neclos is a 52 y.o. F with hx MO, CVA with residual spastic hemiplegia, hx pheochromocytoma s/p adrenalectomy, DM, HTN, hyperthyroidism on methimazole, CAD s/p CABG 2018 in [place], and depression who presented with chest pain.  Was recently admitted for chest pain, observed and discharged.  Returned, this time with vomiting, found to have lipase 179 U/L and CT showing pancreatitis.               Assessment and Plan: * Acute pancreatitis LFTs normal, CT imaging shows no evidence of gallstones, CBD dilation, doubt gallstone pancreatitis.  There is some question whether this is related to semaglutide.  Certainly not alcohol.  Seems to be improving, epigastric pain is improved, no nausea with clears - Advance to full liquids - Hold IV fluids    Insulin dependent type 2 diabetes mellitus (HCC) Glucoses mostly controlled - Continue gabapentin for neuropathy - Continue Semglee - Continue sliding scale corrections  Essential hypertension Blood pressure adequate - Continue metoprolol, ranolazine  Normocytic anemia Hemoglobin stable, no clinical bleeding  Chronic heart failure with preserved ejection fraction (HFpEF) (HCC) Appears euvolemic to swollen.  Not on diuretics at baseline - Hold IV fluids - Continue metoprolol  Aspiration pneumonia (HCC) - Continue Rocephin, day 2 of 5  Morbid obesity (HCC) BMI 37 in the setting of comorbid hypertension, diabetes, hyperlipidemia, vascular disease.  Hyponatremia Mild, asymptomatic - Hold IV fluids  Spastic hemiparesis of left 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 (HCC) - Hold baclofen, Flexeril for now  HLD (hyperlipidemia) - Continue with Lipitor, Zetia  Anxiety and depression - Continue escitalopram  CAD (coronary  artery disease) - Continue Plavix, Xarelto, atorvastatin, Zetia, metoprolol  Hyperthyroidism - Continue methimazole  History of CVA with spastic paresis (cerebrovascular accident) - Continue atorvastatin, Plavix, Zetia, Xarelto          Subjective: Patient's epigastric pain is better, she is more hungry, she has no fever or confusion     Physical Exam: BP 114/80 (BP Location: Right Arm)   Pulse 100   Temp 98.6 F (37 C)   Resp 16   Ht 5\' 2"  (1.575 m)   Wt 92.7 kg   SpO2 94%   BMI 37.38 kg/m   Elderly adult female, appears debilitated, lying in bed, appears weak and tired Tachycardic, no murmurs, no peripheral edema Respiratory rate normal, lungs clear without rales or wheezes, she has mild epigastric pain, only with deep palpation, no rigidity, no guarding Attention normal, affect appropriate Left-sided hemiparesis with spasticity, speech is very dysarthric, appropriate and responsive to questions, oriented to person, place, and time    Data Reviewed: Patient metabolic panel shows mild hyponatremia, normal renal function and LFTs White blood cell count down to 12, hemoglobin 10.2  Family Communication: None present    Disposition: Status is: Inpatient Patient was admitted with acute pancreatitis and aspiration pneumonia  These are improving, she is advancing her diet  If her heart rate is improved tomorrow and she is able to tolerate soft diet through the day, possibly home tomorrow evening or the next day        Author: Alberteen Sam, MD 10/13/2022 3:02 PM  For on call review [email or web address].

## 2022-10-13 NOTE — Plan of Care (Signed)

## 2022-10-13 NOTE — Assessment & Plan Note (Signed)
Appears euvolemic to swollen.  Not on diuretics at baseline - Hold IV fluids - Continue metoprolol

## 2022-10-13 NOTE — TOC Initial Note (Signed)
Transition of Care [facility]) - Initial/Assessment Note    Patient Details  Name: Lori [name] MRN: [id number] Date of Birth: 04/13/1970  Transition of Care [facility]) CM/SW Contact:    Darleene Cleaver, LCSW Phone Number: 10/13/2022, 8:08 PM  Clinical Narrative:                  Patient is from home with spouse.  Patient is alert and oriented x4.  Patient is receiving personal care services through [place] 7 days a week.  Patient plans to return back home and continue with personal care services.    Expected Discharge Plan: Home w Home Health Services Barriers to Discharge: Continued Medical Work up   Patient Goals and CMS Choice Patient states their goals for this hospitalization and ongoing recovery are:: To return back home.          Expected Discharge Plan and Services     Post Acute Care Choice: Home Health Living arrangements for the past 2 months: Single Family Home                                      Prior Living Arrangements/Services Living arrangements for the past 2 months: Single Family Home Lives with:: Spouse Patient language and need for interpreter reviewed:: Yes Do you feel safe going back to the place where you live?: Yes      Need for Family Participation in Patient Care: No (Comment) Care giver support system in place?: Yes (comment) Current home services: Homehealth aide, Other (comment) (PCS services through [facility]) Criminal Activity/Legal Involvement Pertinent to Current Situation/Hospitalization: No - Comment as needed  Activities of Daily Living Home Assistive Devices/Equipment: Wheelchair ADL Screening (condition at time of admission) Patient's cognitive ability adequate to safely complete daily activities?: Yes Is the patient deaf or have difficulty hearing?: No Does the patient have difficulty seeing, even when wearing glasses/contacts?: No Does the patient have difficulty concentrating, remembering, or making decisions?: No Patient  able to express need for assistance with ADLs?: Yes Does the patient have difficulty dressing or bathing?: Yes Independently performs ADLs?: No Communication: Independent Dressing (OT): Needs assistance Is this a change from baseline?: Pre-admission baseline Grooming: Needs assistance Is this a change from baseline?: Pre-admission baseline Feeding: Independent Bathing: Needs assistance Is this a change from baseline?: Pre-admission baseline Toileting: Needs assistance Is this a change from baseline?: Pre-admission baseline In/Out Bed: Needs assistance Is this a change from baseline?: Pre-admission baseline Walks in Home: Dependent Is this a change from baseline?: Pre-admission baseline Does the patient have difficulty walking or climbing stairs?: Yes Weakness of Legs: Both Weakness of Arms/Hands: Both  Permission Sought/Granted Permission sought to share information with : Case Manager, Family Supports Permission granted to share information with : Yes, Release of Information Signed, Yes, Verbal Permission Granted  Share Information with NAMECristina Gong   [phone]  [name],[name] Daughter [phone]  [phone]  Joette Catching Mother   [phone]  Neclos,Zandria Daughter   [phone]  Permission granted to share info w AGENCY: Home health        Emotional Assessment Appearance:: Appears stated age   Affect (typically observed): Calm, Accepting Orientation: : Oriented to Self, Oriented to Place, Oriented to  Time, Oriented to Situation Alcohol / Substance Use: Not Applicable Psych Involvement: No (comment)  Admission diagnosis:  Acute pancreatitis [K85.90] Acute pancreatitis without infection or necrosis, unspecified pancreatitis type [K85.90] Patient Active Problem List  Diagnosis Date Noted   Morbid obesity (HCC) 10/13/2022   Aspiration pneumonia (HCC) 10/13/2022   Chronic heart failure with preserved ejection fraction (HFpEF) (HCC) 10/13/2022    Normocytic anemia 10/13/2022   Acute pancreatitis 10/11/2022   Chest pain of uncertain etiology 10/06/2022   History of CAD (coronary artery disease) 10/06/2022   GAD (generalized anxiety disorder) 10/06/2022   Reactive airway disease 10/06/2022   UTI (urinary tract infection) 07/04/2022   Mild intermittent asthma, uncomplicated 07/04/2022   Lethargy 05/01/2022   Hyponatremia 04/30/2022   Hyperkalemia 04/30/2022   AKI (acute kidney injury) (HCC) 04/30/2022   Wheelchair dependence 04/08/2022   Spasticity as late effect of cerebrovascular accident (CVA) 04/08/2022   Left hemiplegia (HCC) 04/08/2022   Dyspnea 11/02/2021   GERD (gastroesophageal reflux disease) 11/02/2021   Acute bronchitis 08/24/2021   Pseudohyponatremia 07/10/2021   Hypothyroidism 07/10/2021   Functional urinary incontinence 05/17/2021   MDD (major depressive disorder), recurrent episode, severe (HCC) 04/09/2021   Diabetic gastroparesis (HCC) 03/16/2021   Migraines 03/16/2021   History of cerebral infarction    Debility 02/26/2021   DKA (diabetic ketoacidosis) (HCC) 02/19/2021   Weakness    Insulin dependent type 2 diabetes mellitus (HCC)    Aphasia    Spell of abnormal behavior    History of CVA with spastic paresis (cerebrovascular accident) 01/19/2021   Tachycardia 01/19/2021   CAD (coronary artery disease) 01/19/2021   Spastic hemiparesis of left 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 (HCC) 01/19/2021   Diabetes (HCC)    Hyperthyroidism    Essential hypertension    Pheochromocytoma    Anxiety and depression    HLD (hyperlipidemia)    PCP:  Caesar Bookman, NP Pharmacy:   [facility] - [place] - [address] [place] [address] Phone: [phone] Fax: [phone]  [facility] - [place], [place] - [address] [place] [address] Phone: [phone] Fax: [phone]  [id number] - [place], [place] - [address] AT [facility] [address] [place] [address] Phone: [phone] Fax: [phone]     Social Determinants of Health (SDOH) Social History: SDOH Screenings   Food Insecurity: No Food Insecurity (10/12/2022)  Housing: Patient Declined (10/12/2022)  Transportation Needs: No Transportation Needs (10/12/2022)  Utilities: Not At Risk (10/12/2022)  Depression (PHQ2-9): Low Risk  (09/30/2022)  Tobacco Use: Low Risk  (10/11/2022)   SDOH Interventions:     Readmission Risk Interventions    08/29/2021    2:32 PM 07/18/2021   12:43 PM 07/11/2021    1:22 PM  Readmission Risk Prevention Plan  Transportation Screening Complete Complete Complete  Medication Review Oceanographer) Complete Complete Complete  PCP or Specialist appointment within 3-5 days of discharge Complete Complete   HRI or Home Care Consult Complete Complete Complete  SW Recovery Care/Counseling Consult Complete Complete Complete  Palliative Care Screening Not Applicable Not Applicable Not Applicable  Skilled Nursing Facility Complete Complete Complete

## 2022-10-13 NOTE — Assessment & Plan Note (Signed)
-  Continue methimazole ?

## 2022-10-13 NOTE — Assessment & Plan Note (Signed)
-   Hold baclofen, Flexeril for now

## 2022-10-13 NOTE — Assessment & Plan Note (Signed)
-   Continue Rocephin, day 2 of 5

## 2022-10-13 NOTE — Assessment & Plan Note (Signed)
Glucoses mostly controlled - Continue gabapentin for neuropathy - Continue Semglee - Continue sliding scale corrections

## 2022-10-13 NOTE — Plan of Care (Signed)
  Problem: Activity: Goal: Ability to return to baseline activity level will improve Outcome: Progressing   Problem: Cardiovascular: Goal: Ability to achieve and maintain adequate cardiovascular perfusion will improve Outcome: Progressing   Problem: Health Behavior/Discharge Planning: Goal: Ability to safely manage health-related needs after discharge will improve Outcome: Progressing   Problem: Education: Goal: Knowledge of General Education information will improve Description: Including pain rating scale, medication(s)/side effects and non-pharmacologic comfort measures Outcome: Progressing

## 2022-10-13 NOTE — Assessment & Plan Note (Signed)
-   Continue atorvastatin, Plavix, Zetia, Xarelto

## 2022-10-13 NOTE — Plan of Care (Signed)

## 2022-10-13 NOTE — Assessment & Plan Note (Signed)
Blood pressure adequate - Continue metoprolol, ranolazine

## 2022-10-13 NOTE — Assessment & Plan Note (Signed)
Mild, asymptomatic - Hold IV fluids

## 2022-10-13 NOTE — Assessment & Plan Note (Signed)
-   Continue Plavix, Xarelto, atorvastatin, Zetia, metoprolol

## 2022-10-13 NOTE — Assessment & Plan Note (Signed)
BMI 37 in the setting of comorbid hypertension, diabetes, hyperlipidemia, vascular disease.

## 2022-10-13 NOTE — Assessment & Plan Note (Signed)
-   Continue with Lipitor, Zetia

## 2022-10-13 NOTE — Assessment & Plan Note (Signed)
- 

## 2022-10-13 NOTE — Assessment & Plan Note (Signed)
Hemoglobin stable, no clinical bleeding ?

## 2022-10-14 ENCOUNTER — Encounter: Payer: Medicaid Other | Admitting: Physical Medicine and Rehabilitation

## 2022-10-14 DIAGNOSIS — K8500 Idiopathic acute pancreatitis without necrosis or infection: Secondary | ICD-10-CM

## 2022-10-14 LAB — COMPREHENSIVE METABOLIC PANEL
ALT: 12 U/L (ref 0–44)
AST: 9 U/L — ABNORMAL LOW (ref 15–41)
Albumin: 2.6 g/dL — ABNORMAL LOW (ref 3.5–5.0)
Alkaline Phosphatase: 55 U/L (ref 38–126)
Anion gap: 9 (ref 5–15)
BUN: 15 mg/dL (ref 6–20)
CO2: 21 mmol/L — ABNORMAL LOW (ref 22–32)
Calcium: 8 mg/dL — ABNORMAL LOW (ref 8.9–10.3)
Chloride: 103 mmol/L (ref 98–111)
Creatinine, Ser: 0.78 mg/dL (ref 0.44–1.00)
GFR, Estimated: >60 mL/min (ref >60)
Glucose, Bld: 190 mg/dL — ABNORMAL HIGH (ref 70–99)
Potassium: 3.5 mmol/L (ref 3.5–5.1)
Sodium: 133 mmol/L — ABNORMAL LOW (ref 135–145)
Total Bilirubin: 0.8 mg/dL (ref 0.3–1.2)
Total Protein: 5.7 g/dL — ABNORMAL LOW (ref 6.5–8.1)

## 2022-10-14 LAB — CBC
HCT: 29.7 % — ABNORMAL LOW (ref 36.0–46.0)
Hemoglobin: 9.6 g/dL — ABNORMAL LOW (ref 12.0–15.0)
MCH: 25.4 pg — ABNORMAL LOW (ref 26.0–34.0)
MCHC: 32.3 g/dL (ref 30.0–36.0)
MCV: 78.6 fL — ABNORMAL LOW (ref 80.0–100.0)
Platelets: 298 10*3/uL (ref 150–400)
RBC: 3.78 MIL/uL — ABNORMAL LOW (ref 3.87–5.11)
RDW: 17.1 % — ABNORMAL HIGH (ref 11.5–15.5)
WBC: 11.8 10*3/uL — ABNORMAL HIGH (ref 4.0–10.5)
nRBC: 0 % (ref 0.0–0.2)

## 2022-10-14 LAB — GLUCOSE, CAPILLARY
Glucose-Capillary: 172 mg/dL — ABNORMAL HIGH (ref 70–99)
Glucose-Capillary: 199 mg/dL — ABNORMAL HIGH (ref 70–99)
Glucose-Capillary: 199 mg/dL — ABNORMAL HIGH (ref 70–99)
Glucose-Capillary: 330 mg/dL — ABNORMAL HIGH (ref 70–99)

## 2022-10-14 NOTE — Plan of Care (Signed)
  Problem: Activity: Goal: Ability to return to baseline activity level will improve Outcome: Progressing   Problem: Nutrition: Goal: Adequate nutrition will be maintained Outcome: Progressing   Problem: Safety: Goal: Ability to remain free from injury will improve Outcome: Progressing   Problem: Pain Managment: Goal: General experience of comfort will improve Outcome: Progressing

## 2022-10-14 NOTE — Progress Notes (Signed)
  Progress Note   Patient: Lori [name] KYH:[id number] DOB: [birth year] DOA: 10/11/2022     3 DOS: the patient was seen and examined on 10/14/2022 at 11:20 AM      Brief hospital course: Patient is a 52 year old female with past medical history significant for CVA with left sided hemiplegia, complicated by significant dysarthria; obesity, coronary artery disease, status post CABG, chronic diastolic CHF, hypertension, diabetes mellitus type 2, asthma, hypothyroidism, pheochromocytoma s/p adrenalectomy and chronic anxiety/depression.  Patient was admitted with acute pancreatitis.  There is also documentation of "presumptive aspiration pneumonia".  Repeat chest x-ray is nonrevealing.  Will check procalcitonin.  Tachycardia seems to be improving slowly.  Will advance diet as tolerated.  10/14/2022: No GI symptoms reported today.  Specifically, patient denied nausea, vomiting or abdominal pain.   Assessment and Plan: Acute pancreatitis: LFTs normal, CT imaging shows no evidence of gallstones, CBD dilation, doubt gallstone pancreatitis.  There is some question whether this is related to semaglutide.  Certainly not alcohol.  Seems to be improving, epigastric pain is improved, no nausea with clears - Advance to full liquids - Hold IV fluids 10/14/2022: Improving.  Advance diet as tolerated.  Likely discharge tomorrow if patient continues to improve.    Anemia: -Microcytic.  MCV of 78.6.   -Hemoglobin of 9.6 g/dL. -Check iron levels. -Stool for fecal occult blood.  Chronic heart failure with preserved ejection fraction (HFpEF) (HCC) Appears euvolemic to swollen.  Not on diuretics at baseline - Hold IV fluids - Continue metoprolol  Possible aspiration pneumonia [facility]): - Repeat chest x-ray is nonrevealing.  -Check procalcitonin.  Procalcitonin may still be elevated in the absence of aspiration pneumonia due to acute pancreatitis. -Patient is currently on Rocephin. -Have a low threshold to  discontinue antibiotics.    Morbid obesity (HCC): BMI 37 in the setting of comorbid hypertension, diabetes, hyperlipidemia, vascular disease.  Hyponatremia: -Chronic, Mild, asymptomatic - Monitor closely  Insulin dependent type 2 diabetes mellitus (HCC) -Continue to optimize.   -Glucose ranges from 172 - 199. -Continue gabapentin for neuropathy -Continue Semglee -Continue sliding scale corrections  Spastic hemiparesis of left 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 (HCC) - Hold baclofen, Flexeril for now  HLD (hyperlipidemia) - Continue with Lipitor, Zetia  Anxiety and depression - Continue escitalopram  CAD (coronary artery disease) - Continue Plavix, Xarelto, atorvastatin, Zetia, metoprolol  Essential hypertension Blood pressure adequate - Continue metoprolol, ranolazine  Hyperthyroidism - Continue methimazole  History of CVA with spastic paresis (cerebrovascular accident) - Continue atorvastatin, Plavix, Zetia, Xarelto  Subjective:  No new complaints.   Physical Exam: BP 113/71 (BP Location: Right Arm)   Pulse 99   Temp 98.4 F (36.9 C)   Resp 17   Ht 5\' 2"  (1.575 m)   Wt 93.7 kg   SpO2 97%   BMI 37.78 kg/m   General Condition: Left-sided hemiplegia.  Patient is obese.  Patient is dysarthric.  Not in any distress.  HEENT: Patient is pale.  No jaundice. CVS: S1-S2. Lungs: Clear to auscultation. Neuro: Awake and alert.  Dysarthric.  Left-sided hemiplegia. Abdomen: Obese, soft and nontender. Extremities no leg edema.    Family Communication: None present    Disposition: Status is: Inpatient Patient was admitted with acute pancreatitis and aspiration pneumonia     Author: Barnetta Chapel, MD 10/14/2022 7:54 PM  For on call review [email or web address].

## 2022-10-14 NOTE — Progress Notes (Signed)
Physical Therapy Treatment Patient Details Name: Lori [name] MRN: [id number] DOB: [birth year] Today's Date: 10/14/2022   History of Present Illness 52 y/o female admitted with acute pancreatitis. Recent admit 9/1-9/4 for chest pain per pt and chart review. PMHx: DM, obesity, CVA with spastic hemiplegia, hyperthyroidism, HTN, CAD, anxiety and depression, HLD, GERD, aphasia, AKI, polypharmacy, AMS.    PT Comments  Pt agreeable to working with therapy. She was able to stand x 2 at bedside with RW. She took a few side steps along the bed with RW as well. Pt participated well. She reports feeling better. Plan is for return home with HHPT f/u.     If plan is discharge home, recommend the following: Assist for transportation;Assistance with feeding;Assistance with cooking/housework;Help with stairs or ramp for entrance;A lot of help with walking and/or transfers;A lot of help with bathing/dressing/bathroom   Can travel by private vehicle        Equipment Recommendations  None recommended by PT    Recommendations for Other Services       Precautions / Restrictions Precautions Precautions: Fall Restrictions Weight Bearing Restrictions: No     Mobility  Bed Mobility Overal bed mobility: Needs Assistance Bed Mobility: Rolling, Supine to Sit, Sit to Supine Rolling: Mod assist   Supine to sit: Mod assist, HOB elevated, Used rails Sit to supine: Mod assist, Used rails, HOB elevated   General bed mobility comments: Assist for LEs and trunk. Increased time. Utilized bedpad to assist with positioning at EOB.    Transfers Overall transfer level: Needs assistance Equipment used: Rolling walker (2 wheels) Transfers: Sit to/from Stand Sit to Stand: Mod assist, +2 safety/equipment           General transfer comment: Assist to rise, steady, control descent. Sit to stand x 2. She was able to take side steps but unable to step away from the bed due to LE weakness.     Ambulation/Gait Ambulation/Gait assistance: Min assist, +2 safety/equipment   Assistive device: Rolling walker (2 wheels)         General Gait Details: Pt able to take several side steps along bedside with use of RW.   Stairs             Wheelchair Mobility     Tilt Bed    Modified Rankin (Stroke Patients Only)       Balance                                            Cognition Arousal: Alert Behavior During Therapy: WFL for tasks assessed/performed Overall Cognitive Status: Within Functional Limits for tasks assessed                                 General Comments: Expressive difficulties        Exercises      General Comments        Pertinent Vitals/Pain Pain Assessment Pain Assessment: Faces Faces Pain Scale: Hurts a little bit Pain Location: abdomen Pain Descriptors / Indicators: Discomfort Pain Intervention(s): Monitored during session, Limited activity within patient's tolerance, Repositioned    Home Living                          Prior Function  PT Goals (current goals can now be found in the care plan section) Progress towards PT goals: Progressing toward goals    Frequency    Min 1X/week      PT Plan      Co-evaluation              AM-PAC PT "6 Clicks" Mobility   Outcome Measure  Help needed turning from your back to your side while in a flat bed without using bedrails?: A Lot Help needed moving from lying on your back to sitting on the side of a flat bed without using bedrails?: A Lot Help needed moving to and from a bed to a chair (including a wheelchair)?: Total Help needed standing up from a chair using your arms (e.g., wheelchair or bedside chair)?: A Lot Help needed to walk in hospital room?: Total Help needed climbing 3-5 steps with a railing? : Total 6 Click Score: 9    End of Session Equipment Utilized During Treatment: Gait belt Activity  Tolerance: Patient tolerated treatment well Patient left: in bed;with call bell/phone within reach;with bed alarm set   PT Visit Diagnosis: Pain;Hemiplegia and hemiparesis;Muscle weakness (generalized) (M62.81);History of falling (Z91.81) Hemiplegia - Right/Left: Left     Time: 0865-7846 PT Time Calculation (min) (ACUTE ONLY): 21 min  Charges:    $Therapeutic Activity: 8-22 mins PT General Charges $$ ACUTE PT VISIT: 1 Visit                         Faye Ramsay, PT Acute Rehabilitation  Office: [phone]

## 2022-10-14 NOTE — TOC Progression Note (Signed)
Transition of Care [facility]) - Progression Note   Patient Details  Name: Lori [name] MRN: [id number] Date of Birth: Sep 23, 1970  Transition of [facility]) CM/SW Contact  Ewing Schlein, LCSW Phone Number: 10/14/2022, 1:49 PM  Clinical Narrative: PT/OT evaluations recommended HH. CSW met with patient to discuss recommendations and patient is agreeable to [facility] referral to [facility] as she is already receiving PCS through the agency. CSW made [facility] referral to Cindie with [facility], which was accepted. HHPT/OT orders requested.  Expected Discharge Plan: Home w Home Health Services Barriers to Discharge: Continued Medical Work up  Expected Discharge Plan and Services Post Acute Care Choice: Home Health Living arrangements for the past 2 months: Single Family Home HH Arranged: PT, OT HH Agency: [facility] Date [facility] Agency Contacted: 10/14/22 Time HH Agency Contacted: 1328 Representative spoke with at [facility] Agency: Cindie  Social Determinants of Health (SDOH) Interventions SDOH Screenings   Food Insecurity: No Food Insecurity (10/12/2022)  Housing: Patient Declined (10/12/2022)  Transportation Needs: No Transportation Needs (10/12/2022)  Utilities: Not At Risk (10/12/2022)  Depression (PHQ2-9): Low Risk  (09/30/2022)  Tobacco Use: Low Risk  (10/11/2022)   Readmission Risk Interventions    08/29/2021    2:32 PM 07/18/2021   12:43 PM 07/11/2021    1:22 PM  Readmission Risk Prevention Plan  Transportation Screening Complete Complete Complete  Medication Review Oceanographer) Complete Complete Complete  PCP or Specialist appointment within 3-5 days of discharge Complete Complete   HRI or Home Care Consult Complete Complete Complete  SW Recovery Care/Counseling Consult Complete Complete Complete  Palliative Care Screening Not Applicable Not Applicable Not Applicable  Skilled Nursing Facility Complete Complete Complete

## 2022-10-15 ENCOUNTER — Inpatient Hospital Stay (HOSPITAL_COMMUNITY): Payer: 59

## 2022-10-15 ENCOUNTER — Telehealth: Payer: Self-pay

## 2022-10-15 DIAGNOSIS — K8500 Idiopathic acute pancreatitis without necrosis or infection: Secondary | ICD-10-CM | POA: Diagnosis not present

## 2022-10-15 LAB — GLUCOSE, CAPILLARY
Glucose-Capillary: 232 mg/dL — ABNORMAL HIGH (ref 70–99)
Glucose-Capillary: 246 mg/dL — ABNORMAL HIGH (ref 70–99)
Glucose-Capillary: 217 mg/dL — ABNORMAL HIGH (ref 70–99)
Glucose-Capillary: 209 mg/dL — ABNORMAL HIGH (ref 70–99)

## 2022-10-15 LAB — COMPREHENSIVE METABOLIC PANEL
ALT: 13 U/L (ref 0–44)
AST: 11 U/L — ABNORMAL LOW (ref 15–41)
Albumin: 2.6 g/dL — ABNORMAL LOW (ref 3.5–5.0)
Alkaline Phosphatase: 60 U/L (ref 38–126)
Anion gap: 10 (ref 5–15)
BUN: 13 mg/dL (ref 6–20)
CO2: 19 mmol/L — ABNORMAL LOW (ref 22–32)
Calcium: 8.3 mg/dL — ABNORMAL LOW (ref 8.9–10.3)
Chloride: 104 mmol/L (ref 98–111)
Creatinine, Ser: 0.84 mg/dL (ref 0.44–1.00)
GFR, Estimated: >60 mL/min (ref >60)
Glucose, Bld: 244 mg/dL — ABNORMAL HIGH (ref 70–99)
Potassium: 3.8 mmol/L (ref 3.5–5.1)
Sodium: 133 mmol/L — ABNORMAL LOW (ref 135–145)
Total Bilirubin: 0.4 mg/dL (ref 0.3–1.2)
Total Protein: 6 g/dL — ABNORMAL LOW (ref 6.5–8.1)

## 2022-10-15 LAB — LIPASE, BLOOD: Lipase: 197 U/L — ABNORMAL HIGH (ref 11–51)

## 2022-10-15 LAB — CBC WITH DIFFERENTIAL/PLATELET
Abs Immature Granulocytes: 0.03 10*3/uL (ref 0.00–0.07)
Basophils Absolute: 0 10*3/uL (ref 0.0–0.1)
Basophils Relative: 0 %
Eosinophils Absolute: 0.2 10*3/uL (ref 0.0–0.5)
Eosinophils Relative: 2 %
HCT: 30.4 % — ABNORMAL LOW (ref 36.0–46.0)
Hemoglobin: 10.1 g/dL — ABNORMAL LOW (ref 12.0–15.0)
Immature Granulocytes: 0 %
Lymphocytes Relative: 24 %
Lymphs Abs: 2.3 10*3/uL (ref 0.7–4.0)
MCH: 25.6 pg — ABNORMAL LOW (ref 26.0–34.0)
MCHC: 33.2 g/dL (ref 30.0–36.0)
MCV: 77.2 fL — ABNORMAL LOW (ref 80.0–100.0)
Monocytes Absolute: 0.8 10*3/uL (ref 0.1–1.0)
Monocytes Relative: 8 %
Neutro Abs: 6.2 10*3/uL (ref 1.7–7.7)
Neutrophils Relative %: 66 %
Platelets: 222 10*3/uL (ref 150–400)
RBC: 3.94 MIL/uL (ref 3.87–5.11)
RDW: 16.8 % — ABNORMAL HIGH (ref 11.5–15.5)
WBC: 9.5 10*3/uL (ref 4.0–10.5)
nRBC: 0.3 % — ABNORMAL HIGH (ref 0.0–0.2)

## 2022-10-15 LAB — PROCALCITONIN: Procalcitonin: <0.1 ng/mL

## 2022-10-15 LAB — FERRITIN: Ferritin: 84 ng/mL (ref 11–307)

## 2022-10-15 LAB — IRON AND TIBC
Iron: 25 ug/dL — ABNORMAL LOW (ref 28–170)
Saturation Ratios: 10 % — ABNORMAL LOW (ref 10.4–31.8)
TIBC: 252 ug/dL (ref 250–450)
UIBC: 227 ug/dL

## 2022-10-15 MED ORDER — FERROUS SULFATE 325 (65 FE) MG PO TABS
325.0000 mg | ORAL_TABLET | Freq: Every day | ORAL | Status: DC
  Administered 2022-10-16: 325 mg via ORAL
  Filled 2022-10-15: qty 1

## 2022-10-15 MED ORDER — SENNOSIDES-DOCUSATE SODIUM 8.6-50 MG PO TABS
1.0000 | ORAL_TABLET | Freq: Two times a day (BID) | ORAL | Status: DC
  Administered 2022-10-15 – 2022-10-16 (×3): 1 via ORAL
  Filled 2022-10-15 (×3): qty 1

## 2022-10-15 NOTE — Telephone Encounter (Signed)
Prior authorization request form received, completed , and giving to Ngetich, Dinah C, NP to further process. Once form is signed by Carilyn Goodpasture it will be given to the admin staff to fax to [phone]

## 2022-10-15 NOTE — Progress Notes (Signed)
Progress Note   Patient: Lori [name] GEX:[id number] DOB: [birth year] DOA: 10/11/2022     4 DOS: the patient was seen and examined on 10/15/2022 at 11:20 AM      Brief hospital course: Patient is a 52 year old female with past medical history significant for CVA with left sided hemiplegia, complicated by significant dysarthria; obesity, coronary artery disease, status post CABG, chronic diastolic CHF, hypertension, diabetes mellitus type 2, asthma, hypothyroidism, pheochromocytoma s/p adrenalectomy and chronic anxiety/depression.  Patient was admitted with acute pancreatitis.  There is also documentation of "presumptive aspiration pneumonia".  Repeat chest x-ray is nonrevealing.  Will check procalcitonin.  Tachycardia seems to be improving slowly.  Will advance diet as tolerated.  10/14/2022: No GI symptoms reported today.  Specifically, patient denied nausea, vomiting or abdominal pain. 10/15/2022: Patient seen.  Patient reports not feeling well.  Not keen on being discharged back home today.  Patient reports being significantly constipated.  Apparently, patient has not had bowel movement in almost 1 week.  Patient is currently on MiraLAX.  Will proceed with enema.  KUB result is noted (Small-to-moderate stool burden. No evidence of bowel obstruction).   Assessment and Plan: Acute pancreatitis: LFTs normal, CT imaging shows no evidence of gallstones, CBD dilation, doubt gallstone pancreatitis.  There is some question whether this is related to semaglutide.  Certainly not alcohol.  Seems to be improving, epigastric pain is improved, no nausea with clears - Advance to full liquids - Hold IV fluids 10/14/2022: Improving.  Advance diet as tolerated.   10/15/2022: Not keen on being discharged today.  Reports not feeling too well.  Possible discharge back home tomorrow..  Anemia: -Microcytic.  MCV of 77.2.   -Hemoglobin of 10.1 g/dL. -Iron panel revealed likely iron of chronic  inflammation. -Will start patient on ferrous sulfate. -Ensure adequate laxative use. -Stool for fecal occult blood.  Chronic heart failure with preserved ejection fraction (HFpEF) (HCC) Appears euvolemic to swollen.  Not on diuretics at baseline - Hold IV fluids - Continue metoprolol  Possible aspiration pneumonia [facility]): - Repeat chest x-ray is nonrevealing.  -Check procalcitonin.  Procalcitonin may still be elevated in the absence of aspiration pneumonia due to acute pancreatitis. -Patient is currently on Rocephin. -Have a low threshold to discontinue antibiotics.    Morbid obesity (HCC): BMI 37 in the setting of comorbid hypertension, diabetes, hyperlipidemia, vascular disease.  Hyponatremia: -Chronic, Mild, asymptomatic - Monitor closely  Insulin dependent type 2 diabetes mellitus (HCC) -Continue to optimize.   -Glucose ranges from 172 - 199. -Continue gabapentin for neuropathy -Continue Semglee -Continue sliding scale corrections  Spastic hemiparesis of left 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 (HCC) - Hold baclofen, Flexeril for now  HLD (hyperlipidemia) - Continue with Lipitor, Zetia  Anxiety and depression - Continue escitalopram  CAD (coronary artery disease) - Continue Plavix, Xarelto, atorvastatin, Zetia, metoprolol  Essential hypertension Blood pressure adequate - Continue metoprolol, ranolazine  Hyperthyroidism - Continue methimazole  History of CVA with spastic paresis (cerebrovascular accident) - Continue atorvastatin, Plavix, Zetia, Xarelto  Subjective:  No new complaints.   Physical Exam: BP 113/71 (BP Location: Right Arm)   Pulse 99   Temp 98.4 F (36.9 C)   Resp 17   Ht 5\' 2"  (1.575 m)   Wt 93.7 kg   SpO2 97%   BMI 37.78 kg/m   General Condition: Left-sided hemiplegia.  Patient is obese.  Patient is dysarthric.  Not in any distress.  HEENT: Patient is pale.  No jaundice. CVS: S1-S2.  Lungs: Clear to  auscultation. Neuro: Awake and alert.  Dysarthric.  Left-sided hemiplegia. Abdomen: Obese, soft and nontender. Extremities no leg edema.    Family Communication: None present    Disposition: Status is: Inpatient Patient was admitted with acute pancreatitis and aspiration pneumonia     Author: Barnetta Chapel, MD 10/15/2022 7:23 PM  For on call review [email or web address].

## 2022-10-15 NOTE — Plan of Care (Signed)
  Problem: Education: Goal: Understanding of CV disease, CV risk reduction, and recovery process will improve Outcome: Progressing Goal: Individualized Educational Video(s) Outcome: Progressing   

## 2022-10-15 NOTE — Progress Notes (Signed)
Occupational Therapy Treatment Patient Details Name: Lori [name] MRN: [id number] DOB: Sep 08, 1970 Today's Date: 10/15/2022   History of present illness 52 yr old female admitted with acute pancreatitis.  PMH: DM, obesity, CVA with L weakness, hyperthyroidism, HTN, CAD, anxiety and depression, HLD, GERD, aphasia, AKI, polypharmacy, AMS.   OT comments  Pt was motivated to participate in therapy. She required assist for bed mobility, upper and lower body dressing tasks, and grooming seated EOB. She presented with fair to fair+ sitting balance EOB. She was able to stand with mod assist x2, as well as take a couple lateral steps towards the head of the bed. Continue OT plan of care.       If plan is discharge home, recommend the following:  A lot of help with bathing/dressing/bathroom;Assistance with cooking/housework;Assist for transportation;A lot of help with walking and/or transfers   Equipment Recommendations  None recommended by OT    Recommendations for Other Services      Precautions / Restrictions Precautions Precautions: Fall Restrictions Weight Bearing Restrictions: No Other Position/Activity Restrictions: L sided weakness       Mobility Bed Mobility Overal bed mobility: Needs Assistance Bed Mobility: Supine to Sit, Sit to Supine     Supine to sit: Mod assist, HOB elevated, Used rails (required increased time and effort, as well as cues for trunk positioning and using bed rail) Sit to supine: Mod assist, Used rails, HOB elevated (required assist for BLE management)        Transfers Overall transfer level: Needs assistance Equipment used: Rolling walker (2 wheels) Transfers: Sit to/from Stand Sit to Stand: Mod assist, +2 safety/equipment, From elevated surface           General transfer comment: Pt performed 2 sit to stand transfers from EOB. She placed RUE on RW for support. After the 2nd stand, she was able to take 3 lateral steps towards the head of the  bed, requiring mod assist.         ADL either performed or assessed with clinical judgement   ADL Overall ADL's : Needs assistance/impaired Eating/Feeding: Set up;Bed level Eating/Feeding Details (indicate cue type and reason): assist needed to cut food, open lids, and open packets, due to LUE weakness Grooming: Minimal assistance;Sitting Grooming Details (indicate cue type and reason): She performed face washing seated EOB.         Upper Body Dressing : Sitting;Maximal assistance Upper Body Dressing Details (indicate cue type and reason): She doffed a hospital gown seated EOB, then donned another clean one; hemi-technique implemented, given LUE weakness. Lower Body Dressing: Maximal assistance;Sit to/from stand Lower Body Dressing Details (indicate cue type and reason): Pt was assisted with doffing then donning a pair or briefs. She required assist to donn them over her ankles then up over her hips in standing. Steadying assist needed throughout in standing, as well as pt reliant on RUE placed on RW for support in standing.     Toileting- Clothing Manipulation and Hygiene: Maximal assistance;Bed level Toileting - Clothing Manipulation Details (indicate cue type and reason): based on clinical judgement              Cognition Arousal: Alert Behavior During Therapy: WFL for tasks assessed/performed Overall Cognitive Status: Within Functional Limits for tasks assessed        General Comments: Expressive difficulties                   Pertinent Vitals/ Pain       Pain Assessment  Pain Location: She reported discomfort at her LUE IV site. Pain Intervention(s): Monitored during session         Frequency  Min 1X/week        Progress Toward Goals  OT Goals(current goals can now be found in the care plan section)  Progress towards OT goals: Progressing toward goals  Acute Rehab OT Goals Patient Stated Goal: to be able to walk better and to be able to cook  again OT Goal Formulation: With patient Time For Goal Achievement: 10/26/22 Potential to Achieve Goals: Good  Plan         AM-PAC OT "6 Clicks" Daily Activity     Outcome Measure   Help from another person eating meals?: A Little Help from another person taking care of personal grooming?: A Little Help from another person toileting, which includes using toliet, bedpan, or urinal?: A Lot Help from another person bathing (including washing, rinsing, drying)?: A Lot Help from another person to put on and taking off regular upper body clothing?: A Lot Help from another person to put on and taking off regular lower body clothing?: A Lot 6 Click Score: 14    End of Session Equipment Utilized During Treatment: Gait belt;Rolling walker (2 wheels)  OT Visit Diagnosis: Muscle weakness (generalized) (M62.81);Hemiplegia and hemiparesis;Unsteadiness on feet (R26.81) Hemiplegia - Right/Left: Left Hemiplegia - dominant/non-dominant: Non-Dominant   Activity Tolerance Patient tolerated treatment well   Patient Left in bed;with call bell/phone within reach;with bed alarm set   Nurse Communication Mobility status        Time: 3086-5784 OT Time Calculation (min): 26 min  Charges: OT General Charges $OT Visit: 1 Visit OT Treatments $Self Care/Home Management : 8-22 mins $Therapeutic Activity: 8-22 mins      Reuben Likes, OTR/L 10/15/2022, 11:13 AM

## 2022-10-15 NOTE — TOC Progression Note (Signed)
Transition of Care [facility]) - Progression Note   Patient Details  Name: Lori [name] MRN: [id number] Date of Birth: Jul 10, 1970  [facility] CM/SW Contact  Ewing Schlein, LCSW Phone Number: 10/15/2022, 10:25 AM  Clinical Narrative: CSW notified Cindie with [facility] that HH orders have been placed.    Expected Discharge Plan: Home w Home Health Services Barriers to Discharge: Continued Medical Work up  Expected Discharge Plan and Services Post Acute Care Choice: Home Health Living arrangements for the past 2 months: Single Family Home HH Arranged: PT, OT HH Agency: [facility] Date [facility] Agency Contacted: 10/14/22 Time HH Agency Contacted: [id number] Representative spoke with at [facility] Agency: Cindie  Social Determinants of Health (SDOH) Interventions SDOH Screenings   Food Insecurity: No Food Insecurity (10/12/2022)  Housing: Patient Declined (10/12/2022)  Transportation Needs: No Transportation Needs (10/12/2022)  Utilities: Not At Risk (10/12/2022)  Depression (PHQ2-9): Low Risk  (09/30/2022)  Tobacco Use: Low Risk  (10/11/2022)   Readmission Risk Interventions    10/15/2022   10:25 AM 08/29/2021    2:32 PM 07/18/2021   12:43 PM  Readmission Risk Prevention Plan  Transportation Screening Complete Complete Complete  Medication Review [employer])  Complete Complete  PCP or Specialist appointment within 3-5 days of discharge Complete Complete Complete  HRI or Home Care Consult Complete Complete Complete  SW Recovery Care/Counseling Consult Complete Complete Complete  Palliative Care Screening Not Applicable Not Applicable Not Applicable  Skilled Nursing Facility Not Applicable Complete Complete

## 2022-10-15 NOTE — Plan of Care (Signed)
  Problem: Education: Goal: Knowledge of General Education information will improve Description: Including pain rating scale, medication(s)/side effects and non-pharmacologic comfort measures Outcome: Progressing   Problem: Clinical Measurements: Goal: Ability to maintain clinical measurements within normal limits will improve Outcome: Progressing   Problem: Nutrition: Goal: Adequate nutrition will be maintained Outcome: Progressing   Problem: Pain Managment: Goal: General experience of comfort will improve Outcome: Progressing   Problem: Safety: Goal: Ability to remain free from injury will improve Outcome: Progressing   

## 2022-10-15 NOTE — Inpatient Diabetes Management (Signed)
Inpatient Diabetes Program Recommendations  AACE/ADA: New Consensus Statement on Inpatient Glycemic Control (2015)  Target Ranges:  Prepandial:   less than 140 mg/dL      Peak postprandial:   less than 180 mg/dL (1-2 hours)      Critically ill patients:  140 - 180 mg/dL   Lab Results  Component Value Date   GLUCAP 246 (H) 10/15/2022   HGBA1C 9.8 (H) 08/27/2022    Review of Glycemic Control  Latest Reference Range & Units 10/14/22 11:25 10/14/22 16:20 10/14/22 21:52 10/15/22 07:57 10/15/22 12:19  Glucose-Capillary 70 - 99 mg/dL 284 (H) 132 (H) 440 (H) 232 (H) 246 (H)  (H): Data is abnormally high  Diabetes history: DM2 Outpatient Diabetes medications: Lantus 40 units every day, Humalog 15 units TID and Ozempic 0.5 mg weekly Current orders for Inpatient glycemic control: Semglee 20 units every day, Novolog 0-15 units TID and 0-5 units QHS  Inpatient Diabetes Program Recommendations:    Glucose has been elevated since yesterday.  Please consider:  Semglee 30 units at bedtime Novolog 3 units TID with meals if she consume sat least 50%   Will continue to follow while inpatient.  Thank you, Dulce Sellar, MSN, CDCES Diabetes Coordinator Inpatient Diabetes Program [phone] (team pager from 8a-5p)

## 2022-10-16 ENCOUNTER — Encounter: Payer: Medicaid Other | Admitting: Family

## 2022-10-16 ENCOUNTER — Other Ambulatory Visit: Payer: Self-pay | Admitting: Family

## 2022-10-16 DIAGNOSIS — K859 Acute pancreatitis without necrosis or infection, unspecified: Secondary | ICD-10-CM | POA: Diagnosis not present

## 2022-10-16 DIAGNOSIS — E119 Type 2 diabetes mellitus without complications: Secondary | ICD-10-CM | POA: Diagnosis not present

## 2022-10-16 DIAGNOSIS — J690 Pneumonitis due to inhalation of food and vomit: Secondary | ICD-10-CM | POA: Diagnosis not present

## 2022-10-16 LAB — GLUCOSE, CAPILLARY
Glucose-Capillary: 235 mg/dL — ABNORMAL HIGH (ref 70–99)
Glucose-Capillary: 283 mg/dL — ABNORMAL HIGH (ref 70–99)
Glucose-Capillary: 194 mg/dL — ABNORMAL HIGH (ref 70–99)
Glucose-Capillary: 285 mg/dL — ABNORMAL HIGH (ref 70–99)

## 2022-10-16 NOTE — Progress Notes (Signed)
Patient discharge complete. Patient requested [facility] to transport her home, called and set up. Just awaiting to pick up.

## 2022-10-16 NOTE — Discharge Summary (Signed)
Physician Discharge Summary   Patient: Lori [name] MRN: [id number] DOB: Jun 16, 1970  Admit date:     10/11/2022  Discharge date: 10/16/22  Discharge Physician: Brendia Sacks   PCP: Caesar Bookman, NP   Recommendations at discharge:   Resolution of acute pancreatitis.  Could consider initiating semaglutide as an outpatient as apparently she has not started this medication yet.  Discharge Diagnoses: Principal Problem:   Acute pancreatitis Active Problems:   Insulin dependent type 2 diabetes mellitus (HCC)   Essential hypertension   History of CVA with spastic paresis (cerebrovascular accident)   Hyperthyroidism   CAD (coronary artery disease)   Anxiety and depression   HLD (hyperlipidemia)   Spastic hemiparesis of left 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 (HCC)   Hyponatremia   Morbid obesity (HCC)   Aspiration pneumonia (HCC)   Chronic heart failure with preserved ejection fraction (HFpEF) (HCC)   Normocytic anemia  Resolved Problems:   * No resolved hospital problems. *  Hospital Course: 52 year old woman with complex PMH including CABG, CVA with spastic left hemiparesis, diabetes, recently admitted for chest pain evaluation, who presented with epigastric pain nausea and vomiting.  Admitted for acute pancreatitis.  Treated with standard management with gradual clinical improvement.  Consultants None  Procedures None  Acute pancreatitis in the setting of semaglutide --note the patient reports she had not yet started this medication Presented with epigastric pain radiated to her back, nausea and vomiting, lipase 179, CT findings suggestive of acute pancreatitis without evidence of complicating features.  No evidence of gallstones or CBD dilatation. Last triglyceride level 157. No alcohol use. Hold semaglutide given acute pancreatitis, may still consider initiating this medication as an outpatient.   Possible aspiration pneumonia Treated with empiric  antibiotics.  No signs or symptoms of respiratory distress or hypoxia.  Chest x-ray without evidence of aspiration pneumonia. Stop antibiotics  Anemia, microcytic Stable.  Follow-up as an outpatient.  Ferritin within normal limits.  Diabetes mellitus type 2 Diabetic neuropathy CBG stable Hold semaglutide.  Resume Lantus and Humalog on discharge.  Continue gabapentin.  Coronary artery disease status post CABG Chronic HFpEF 60 to 65%   QTc prolongation Avoid QTc prolonging agents   PMH CVA with spastic hemiparesis  Morbid obesity Body mass index is 38.35 kg/m.  In the setting of comorbid hypertension, diabetes, hyperlipidemia, vascular disease  Hyponatremia Very mild in nature.  Expect spontaneous resolution.  CAD (coronary artery disease) Continue Plavix, Xarelto, atorvastatin, Zetia, metoprolol   Essential hypertension Blood pressure adequate Continue metoprolol, ranolazine  Disposition: Home health Diet recommendation:  Discharge Diet Orders (From admission, onward)     Start     Ordered   10/16/22 0000  Diet - low sodium heart healthy        10/16/22 1545           Carb modified diet DISCHARGE MEDICATION: Allergies as of 10/16/2022       Reactions   Contrast Media [iodinated Contrast Media] Anaphylaxis, Swelling   Penicillins Swelling   Mouth swells up and eyes swollen shut   Sumatriptan Anaphylaxis   Vancomycin Other (See Comments)   Red Man's Syndrome   Aspirin Swelling   Ciprofloxacin    Feel nausea/vomitting   Morphine Swelling        Medication List     STOP taking these medications    Semaglutide(0.25 or 0.5MG /DOS) 2 MG/3ML Sopn       TAKE these medications    albuterol (2.5 MG/3ML) 0.083% nebulizer  solution Commonly known as: PROVENTIL Take 3 mLs (2.5 mg total) by nebulization every 6 (six) hours as needed for wheezing or shortness of breath. What changed: Another medication with the same name was changed. Make sure you  understand how and when to take each.   albuterol 108 (90 Base) MCG/ACT inhaler Commonly known as: VENTOLIN HFA INHALE 2 PUFFS BY MOUTH EVERY 6 HOURS AS NEEDED FOR WHEEZING AND FOR SHORTNESS OF BREATH What changed: See the new instructions.   atorvastatin 80 MG tablet Commonly known as: LIPITOR Take 1 tablet (80 mg total) by mouth daily.   baclofen 10 MG tablet Commonly known as: LIORESAL Take 1 tablet (10 mg total) by mouth 3 (three) times daily. X 1 week, then 10 mg TID-for spasticity- can increase  the dose if you call me to discuss   blood glucose meter kit and supplies 1 each by Other route as directed. Dispense based on patient and insurance preference. Use up to four times daily as directed. (FOR ICD-10 E10.9, E11.9).   budesonide 0.25 MG/2ML nebulizer solution Commonly known as: PULMICORT Take 2 mLs (0.25 mg total) by nebulization 2 (two) times daily.   clopidogrel 75 MG tablet Commonly known as: PLAVIX Take 1 tablet (75 mg total) by mouth daily.   cyclobenzaprine 5 MG tablet Commonly known as: FLEXERIL Take 1 tablet (5 mg total) by mouth 3 (three) times daily as needed for muscle spasms.   diclofenac Sodium 1 % Gel Commonly known as: VOLTAREN Apply 4 g topically 4 (four) times daily.   escitalopram 20 MG tablet Commonly known as: LEXAPRO Take 1 tablet (20 mg total) by mouth every morning.   ezetimibe 10 MG tablet Commonly known as: Zetia Take 1 tablet (10 mg total) by mouth daily.   FreeStyle Libre 14 Day Sensor Misc 1 each by Does not apply route as needed. DX: E11.69   gabapentin 100 MG capsule Commonly known as: NEURONTIN Take 1 capsule (100 mg total) by mouth 3 (three) times daily.   insulin lispro 100 UNIT/ML KwikPen Commonly known as: HUMALOG Inject 15 Units into the skin 3 (three) times daily with meals. For blood sugars > 150   Insulin Pen Needle 32G X 4 MM Misc Use to inject insulin 4 times daily as directed.   Klayesta powder Generic drug:  nystatin Apply 1 Application topically 2 (two) times daily. Affected areas on breast fold, groin, and sacral areas.   Lantus SoloStar 100 UNIT/ML Solostar Pen Generic drug: insulin glargine INJECT 40 UNITS SUBCUTANEOUSLY AT BEDTIME What changed: See the new instructions.   LORazepam 0.5 MG tablet Commonly known as: ATIVAN Take 1 tablet (0.5 mg total) by mouth at bedtime.   methimazole 5 MG tablet Commonly known as: TAPAZOLE Take 1 tablet (5 mg total) by mouth every morning.   omeprazole 20 MG capsule Commonly known as: PRILOSEC Take 1 capsule (20 mg total) by mouth daily.   polyethylene glycol powder 17 GM/SCOOP powder Commonly known as: GLYCOLAX/MIRALAX Take 17 g by mouth daily. Hold for loose stool   ranolazine 500 MG 12 hr tablet Commonly known as: RANEXA Take 1 tablet (500 mg total) by mouth 2 (two) times daily.   rivaroxaban 10 MG Tabs tablet Commonly known as: XARELTO Take 1 tablet (10 mg total) by mouth daily.   tamsulosin 0.4 MG Caps capsule Commonly known as: FLOMAX Take 1 capsule (0.4 mg total) by mouth at bedtime.   topiramate 25 MG tablet Commonly known as: TOPAMAX Take 1 tablet (25  mg total) by mouth 2 (two) times daily.   zinc oxide 11.3 % Crea cream Commonly known as: BALMEX Apply 1 Application topically 2 (two) times daily.        Follow-up Information     [facility] Follow up.   Specialty: Home Health Services Why: [facility] will provide PT and OT in the home after discharge. Contact information: [address] [place] [address] [phone]         Ngetich, Donalee Citrin, NP. Schedule an appointment as soon as possible for a visit in 1 week(s).   Specialty: Family Medicine Contact information: [address] [place] [address] [phone]                Feels better Tolerating diet -- oatmeal for breakfast, pot roast for lunch No n/v No pain  Discharge Exam: Filed Weights   10/14/22 0456 10/15/22  0500 10/16/22 0500  Weight: 93.7 kg 96 kg 95.1 kg   Physical Exam Vitals reviewed.  Constitutional:      General: She is not in acute distress.    Appearance: She is not ill-appearing or toxic-appearing.  Cardiovascular:     Rate and Rhythm: Normal rate and regular rhythm.     Heart sounds: No murmur heard. Pulmonary:     Effort: Pulmonary effort is normal. No respiratory distress.     Breath sounds: No wheezing, rhonchi or rales.  Abdominal:     General: There is no distension.     Palpations: Abdomen is soft.     Tenderness: There is no abdominal tenderness.  Neurological:     Mental Status: She is alert.  Psychiatric:        Mood and Affect: Mood normal.        Behavior: Behavior normal.      Condition at discharge: good  The results of significant diagnostics from this hospitalization (including imaging, microbiology, ancillary and laboratory) are listed below for reference.   Imaging Studies: DG Abd 1 View  Result Date: 10/15/2022 CLINICAL DATA:  Constipation. EXAM: ABDOMEN - 1 VIEW COMPARISON:  CT abdomen and pelvis 10/11/2022 FINDINGS: A small to moderate amount of stool is present throughout the colon and rectum. No dilated loops of bowel are seen to suggest obstruction. Right upper quadrant abdominal surgical clips and sternal wires are noted. No acute osseous abnormality is seen. IMPRESSION: Small-to-moderate stool burden.  No evidence of bowel obstruction. Electronically Signed   By: Sebastian Ache M.D.   On: 10/15/2022 14:34   DG CHEST PORT 1 VIEW  Result Date: 10/13/2022 CLINICAL DATA:  Shortness of breath.  Possible aspiration. EXAM: PORTABLE CHEST 1 VIEW COMPARISON:  10/11/2022 FINDINGS: The heart size and mediastinal contours are within normal limits. Prior median sternotomy. Low lung volumes noted. Both lungs are clear. The visualized skeletal structures are unremarkable. IMPRESSION: Low lung volumes. No active disease. Electronically Signed   By: Danae Orleans M.D.    On: 10/13/2022 10:20   CT ABDOMEN PELVIS WO CONTRAST  Result Date: 10/11/2022 CLINICAL DATA:  Nonbilious vomiting. 52 year old female. Concern for aspiration. EXAM: CT ABDOMEN AND PELVIS WITHOUT CONTRAST TECHNIQUE: Multidetector CT imaging of the abdomen and pelvis was performed following the standard protocol without IV contrast. RADIATION DOSE REDUCTION: This exam was performed according to the departmental dose-optimization program which includes automated exposure control, adjustment of the mA and/or kV according to patient size and/or use of iterative reconstruction technique. COMPARISON:  CTs without contrast dated 07/04/2022 and 07/10/2021  FINDINGS: Lower chest: Sternotomy and old CABG changes with heavy native CAD particularly for age. There are calcifications in the aortic valve plane. The cardiac size is normal. There is no pericardial effusion. Small hiatal hernia. Linear scarring or atelectasis again seen in the lower lobes, right middle lobe but no acute lung base process. Hepatobiliary: No focal liver abnormality is seen without contrast. The gallbladder and bile ducts are unremarkable. Pancreas: Interval new finding of fatty stranding around the entire pancreas, development of mild generalized pancreatic edema. Findings consistent with acute interstitial pancreatitis. No pancreatic ductal dilatation is seen. No peripancreatic hemorrhage or abscess is seen. There is trace peripancreatic reactive fluid tracking slightly inferiorly. No pancreatic mass is seen without contrast. Spleen: No abnormality. Adrenals/Urinary Tract: Surgical clips from prior right adrenalectomy. Unremarkable left adrenal gland. There is no contour deforming abnormality of either kidney. Congenital malrotation of the right kidney. There is no evidence of urinary stones or obstruction. Unremarkable bladder. Stomach/Bowel: The stomach is contracted. There is a 2.8 cm diverticulum off the medial aspect of the second portion,  containing debris and trace fluid. Rest of the small bowel is unremarkable and normal caliber. The appendix is normal and well seen. Mild-to-moderate fecal stasis ascending and transverse colon, left colonic diverticula without evidence of diverticulitis again noted. Vascular/Lymphatic: Extensive aortic and iliac/branch vessel atherosclerosis particularly for a 52 year old. No AAA. No lymphadenopathy is seen. Reproductive: Status post hysterectomy. No adnexal masses. Other: Umbilical rectus diastasis and outward protrusion is again noted with several small right of the midline fat hernias and a small midline umbilical fat hernia. There is a small right inguinal fat hernia. Small caliber small bowel segments extend into the area of midline outward protrusion at the umbilicus but there is no incarcerated hernia. There has no pelvic ascites. No free hemorrhage, free air or abscess. Musculoskeletal: Possibly associated with the patient's C-section scar, in the deep subcutaneous plane in the midline abutting the low anterior pelvic wall there is an old irregular masslike density measuring 3.5 x 2 cm on 2:91, unchanged. This could represent a fibroma, dense scar related to old hematoma, or an extra-abdominal endometrioma. In any case it is entirely stable. There is slight lumbar levoscoliosis, mild osteopenia. Mild degenerative change lumbar spine. IMPRESSION: 1. Acute interstitial pancreatitis without evidence of hemorrhage or abscess. Minimal nonlocalizing reactive fluid. 2. Constipation and diverticulosis. 3. Age advanced atherosclerosis.  Prior CABG. 4. Umbilical rectus diastasis and outward protrusion with several small right of the midline fat hernias and a small midline umbilical fat hernia. Small caliber small bowel segments extend into the area of midline outward protrusion at the umbilicus but there is no incarcerated hernia or SBO. 5. Small hiatal hernia. 6. Stable 3.5 x 2 cm irregular masslike density in the  deep subcutaneous plane abutting the low anterior pelvic wall. This could represent a fibroma, dense scar such as related to old hematoma, or an extra-abdominal endometrioma. In any case it is entirely unchanged. Aortic Atherosclerosis (ICD10-I70.0). Electronically Signed   By: Almira Bar M.D.   On: 10/11/2022 21:51   DG Chest Portable 1 View  Result Date: 10/11/2022 CLINICAL DATA:  Vomiting with possible aspiration. EXAM: PORTABLE CHEST 1 VIEW COMPARISON:  October 06, 2022 FINDINGS: Limited study secondary to patient rotation. Multiple sternal wires and vascular clips are seen. The heart size and mediastinal contours are within normal limits. Low lung volumes are present without evidence of an acute infiltrate, pleural effusion or pneumothorax. The visualized skeletal structures are unremarkable. IMPRESSION: 1.  Evidence of prior median sternotomy/CABG. 2. Low lung volumes without acute or active cardiopulmonary disease. Electronically Signed   By: Aram Candela M.D.   On: 10/11/2022 20:57   ECHOCARDIOGRAM COMPLETE  Result Date: 10/07/2022    ECHOCARDIOGRAM REPORT   Patient Name:   SHEILA SANDROCK Date of Exam: 10/07/2022 Medical Rec #:  [id number]           Height:       62.0 in Accession #:    [id number]          Weight:       201.9 lb Date of Birth:  09-Jan-1971            BSA:          1.920 m Patient Age:    51 years            BP:           84/63 mmHg Patient Gender: F                   HR:           86 bpm. Exam Location:  Inpatient Procedure: 2D Echo, Color Doppler, Cardiac Doppler and Intracardiac            Opacification Agent Indications:    Chest Pain  History:        Patient has prior history of Echocardiogram examinations, most                 recent 01/21/2021. CAD, Stroke, Signs/Symptoms:Chest Pain; Risk                 Factors:Diabetes, Hypertension and Dyslipidemia.  Sonographer:    Milbert Coulter Referring Phys: [id number] SUBRINA SUNDIL  Sonographer Comments: Technically difficult study  due to poor echo windows, suboptimal apical window, suboptimal subcostal window and patient is obese. Image acquisition challenging due to patient body habitus and patient could not tollerate much pressure in the apical windows due to broken skin and rash. IMPRESSIONS  1. Left ventricular ejection fraction, by estimation, is 60 to 65%. The left ventricle has normal function. The left ventricle has no regional wall motion abnormalities. There is mild concentric left ventricular hypertrophy. Left ventricular diastolic parameters were normal.  2. Right ventricular systolic function is normal. The right ventricular size is normal. There is normal pulmonary artery systolic pressure. The estimated right ventricular systolic pressure is 21.0 mmHg.  3. The mitral valve is grossly normal. Trivial mitral valve regurgitation.  4. The aortic valve is tricuspid. There is mild calcification of the aortic valve. Aortic valve regurgitation is not visualized. Aortic valve sclerosis/calcification is present, without any evidence of aortic stenosis. Aortic valve mean gradient measures 4.0 mmHg.  5. The inferior vena cava is normal in size with greater than 50% respiratory variability, suggesting right atrial pressure of 3 mmHg.  6. Cannot exclude a small PFO. Comparison(s): Prior images reviewed side by side. LVEF 60-65%. Normal estimated RVSP. Cannot exclude small PFO. FINDINGS  Left Ventricle: Left ventricular ejection fraction, by estimation, is 60 to 65%. The left ventricle has normal function. The left ventricle has no regional wall motion abnormalities. Definity contrast agent was given IV to delineate the left ventricular  endocardial borders. The left ventricular internal cavity size was normal in size. There is mild concentric left ventricular hypertrophy. Left ventricular diastolic parameters were normal. Right Ventricle: The right ventricular size is normal. No increase in right ventricular wall thickness. Right ventricular  systolic  function is normal. There is normal pulmonary artery systolic pressure. The tricuspid regurgitant velocity is 2.12 m/s, and  with an assumed right atrial pressure of 3 mmHg, the estimated right ventricular systolic pressure is 21.0 mmHg. Left Atrium: Left atrial size was normal in size. Right Atrium: Right atrial size was normal in size. Pericardium: Trivial pericardial effusion is present. The pericardial effusion is posterior to the left ventricle. Presence of epicardial fat layer. Mitral Valve: The mitral valve is grossly normal. Trivial mitral valve regurgitation. Tricuspid Valve: The tricuspid valve is grossly normal. Tricuspid valve regurgitation is mild. Aortic Valve: The aortic valve is tricuspid. There is mild calcification of the aortic valve. Aortic valve regurgitation is not visualized. Aortic valve sclerosis/calcification is present, without any evidence of aortic stenosis. Aortic valve mean gradient measures 4.0 mmHg. Aortic valve peak gradient measures 8.3 mmHg. Aortic valve area, by VTI measures 1.58 cm. Pulmonic Valve: The pulmonic valve was grossly normal. Pulmonic valve regurgitation is trivial. Aorta: The aortic root and ascending aorta are structurally normal, with no evidence of dilitation. Venous: The inferior vena cava is normal in size with greater than 50% respiratory variability, suggesting right atrial pressure of 3 mmHg. IAS/Shunts: Cannot exclude a small PFO.  LEFT VENTRICLE PLAX 2D LVIDd:         3.50 cm   Diastology LVIDs:         2.10 cm   LV e' medial:    6.74 cm/s LV PW:         1.20 cm   LV E/e' medial:  8.9 LV IVS:        1.00 cm   LV e' lateral:   8.27 cm/s LVOT diam:     1.90 cm   LV E/e' lateral: 7.2 LV SV:         40 LV SV Index:   21 LVOT Area:     2.84 cm  LEFT ATRIUM         Index LA diam:    2.90 cm 1.51 cm/m  AORTIC VALVE AV Area (Vmax):    1.51 cm AV Area (Vmean):   1.50 cm AV Area (VTI):     1.58 cm AV Vmax:           144.00 cm/s AV Vmean:           97.000 cm/s AV VTI:            0.253 m AV Peak Grad:      8.3 mmHg AV Mean Grad:      4.0 mmHg LVOT Vmax:         76.50 cm/s LVOT Vmean:        51.400 cm/s LVOT VTI:          0.141 m LVOT/AV VTI ratio: 0.56  AORTA Ao Root diam: 2.90 cm Ao Asc diam:  2.80 cm MITRAL VALVE               TRICUSPID VALVE MV Area (PHT): 3.50 cm    TR Peak grad:   18.0 mmHg MV Decel Time: 217 msec    TR Vmax:        212.00 cm/s MV E velocity: 59.70 cm/s MV A velocity: 58.70 cm/s  SHUNTS MV E/A ratio:  1.02        Systemic VTI:  0.14 m  Systemic Diam: 1.90 cm Nona Dell MD Electronically signed by Nona Dell MD Signature Date/Time: 10/07/2022/4:47:43 PM    Final    DG Chest 2 View  Result Date: 10/06/2022 CLINICAL DATA:  Left-sided chest pain radiates to the neck. EXAM: CHEST - 2 VIEW COMPARISON:  09/06/2022 FINDINGS: The lungs are clear without focal pneumonia, edema, pneumothorax or pleural effusion. The cardiopericardial silhouette is within normal limits for size. Status post CABG. IMPRESSION: No active cardiopulmonary disease. Electronically Signed   By: Kennith Center M.D.   On: 10/06/2022 17:51    Microbiology: Results for orders placed or performed during the hospital encounter of 10/06/22  Resp panel by RT-PCR (RSV, Flu A&B, Covid) Anterior Nasal Swab     Status: None   Collection Time: 10/06/22  4:27 PM   Specimen: Anterior Nasal Swab  Result Value Ref Range Status   SARS Coronavirus 2 by RT PCR NEGATIVE NEGATIVE Final   Influenza A by PCR NEGATIVE NEGATIVE Final   Influenza B by PCR NEGATIVE NEGATIVE Final    Comment: (NOTE) The Xpert Xpress SARS-CoV-2/FLU/RSV plus assay is intended as an aid in the diagnosis of influenza from Nasopharyngeal swab specimens and should not be used as a sole basis for treatment. Nasal washings and aspirates are unacceptable for Xpert Xpress SARS-CoV-2/FLU/RSV testing.  Fact Sheet for Patients: BloggerCourse.com  Fact  Sheet for Healthcare Providers: SeriousBroker.it  This test is not yet approved or cleared by the [place] FDA and has been authorized for detection and/or diagnosis of SARS-CoV-2 by FDA under an Emergency Use Authorization (EUA). This EUA will remain in effect (meaning this test can be used) for the duration of the COVID-19 declaration under Section 564(b)(1) of the Act, 21 U.S.C. section 360bbb-3(b)(1), unless the authorization is terminated or revoked.     Resp Syncytial Virus by PCR NEGATIVE NEGATIVE Final    Comment: (NOTE) Fact Sheet for Patients: BloggerCourse.com  Fact Sheet for Healthcare Providers: SeriousBroker.it  This test is not yet approved or cleared by the [place] FDA and has been authorized for detection and/or diagnosis of SARS-CoV-2 by FDA under an Emergency Use Authorization (EUA). This EUA will remain in effect (meaning this test can be used) for the duration of the COVID-19 declaration under Section 564(b)(1) of the Act, 21 U.S.C. section 360bbb-3(b)(1), unless the authorization is terminated or revoked.  Performed at [facility], [address], [place], [place] [address]     Labs: CBC: Recent Labs  Lab 10/11/22 1903 10/12/22 0714 10/13/22 0325 10/14/22 0333 10/15/22 0330  WBC 14.3* 15.0* 12.2* 11.8* 9.5  NEUTROABS 9.4*  --  7.5  --  6.2  HGB 13.1 12.1 10.2* 9.6* 10.1*  HCT 41.7 36.4 31.9* 29.7* 30.4*  MCV 80.8 78.1* 79.6* 78.6* 77.2*  PLT 399 371 300 298 222   Basic Metabolic Panel: Recent Labs  Lab 10/11/22 1903 10/12/22 0714 10/13/22 0325 10/14/22 0333 10/15/22 0330  NA 138 135 134* 133* 133*  K 3.8 3.7 3.6 3.5 3.8  CL 101 99 102 103 104  CO2 26 23 23  21* 19*  GLUCOSE 107* 183* 102* 190* 244*  BUN 31* 24* 17 15 13   CREATININE 1.09* 0.91 0.81 0.78 0.84  CALCIUM 9.5 8.9 8.3* 8.0* 8.3*  MG  --  1.8 2.1  --   --   PHOS  --  3.2 3.9  --    --    Liver Function Tests: Recent Labs  Lab 10/11/22 1903 10/12/22 0714 10/13/22 0325  10/14/22 0333 10/15/22 0330  AST 14* 14* 10* 9* 11*  ALT 20 18 13 12 13   ALKPHOS 87 77 60 55 60  BILITOT 0.4 0.5 0.8 0.8 0.4  PROT 8.1 6.9 5.7* 5.7* 6.0*  ALBUMIN 4.0 3.4* 2.9* 2.6* 2.6*   CBG: Recent Labs  Lab 10/15/22 1219 10/15/22 1654 10/15/22 2117 10/16/22 0743 10/16/22 1156  GLUCAP 246* 217* 209* 235* 283*    Discharge time spent: greater than 30 minutes.  Signed: Brendia Sacks, MD [facility] 10/16/2022

## 2022-10-16 NOTE — TOC Progression Note (Signed)
Transition of Care [facility]) - Progression Note   Patient Details  Name: Lori [name] MRN: [id number] Date of Birth: 1970-12-25  Transition of Care [facility]) CM/SW Contact  Ewing Schlein, LCSW Phone Number: 10/16/2022, 11:24 AM  Clinical Narrative: Patient requested SNF, but HH is still recommended. CSW updated patient regarding the continued recommendation for [facility].    Expected Discharge Plan: Home w Home Health Services Barriers to Discharge: Continued Medical Work up  Expected Discharge Plan and Services Post Acute Care Choice: Home Health Living arrangements for the past 2 months: Single Family Home HH Arranged: PT, OT HH Agency: [facility] Date [facility] Agency Contacted: 10/14/22 Time HH Agency Contacted: 1328 Representative spoke with at [facility] Agency: Cindie  Social Determinants of Health (SDOH) Interventions SDOH Screenings   Food Insecurity: No Food Insecurity (10/12/2022)  Housing: Patient Declined (10/12/2022)  Transportation Needs: No Transportation Needs (10/12/2022)  Utilities: Not At Risk (10/12/2022)  Depression (PHQ2-9): Low Risk  (09/30/2022)  Tobacco Use: Low Risk  (10/11/2022)   Readmission Risk Interventions    10/15/2022   10:25 AM 08/29/2021    2:32 PM 07/18/2021   12:43 PM  Readmission Risk Prevention Plan  Transportation Screening Complete Complete Complete  Medication Review [facility])  Complete Complete  PCP or Specialist appointment within 3-5 days of discharge Complete Complete Complete  HRI or Home Care Consult Complete Complete Complete  SW Recovery Care/Counseling Consult Complete Complete Complete  Palliative Care Screening Not Applicable Not Applicable Not Applicable  Skilled Nursing Facility Not Applicable Complete Complete

## 2022-10-16 NOTE — Progress Notes (Signed)
Physical Therapy Treatment Patient Details Name: Lori [name] MRN: [id number] DOB: 1970/02/09 Today's Date: 10/16/2022   History of Present Illness 52 yr old female admitted with acute pancreatitis.  PMH: DM, obesity, CVA with L weakness, hyperthyroidism, HTN, CAD, anxiety and depression, HLD, GERD, aphasia, AKI, polypharmacy, AMS.    PT Comments  Pt agreeable to working with therapy with some encouragement. She reports not feeling well on today. She was able to stand x 2 on today with use of RW. She took several side steps along side of bed with RW. Assisted back to bed. Pt reports she is hopeful to d/c home on today.     If plan is discharge home, recommend the following: Assist for transportation;Assistance with feeding;Assistance with cooking/housework;Help with stairs or ramp for entrance;A lot of help with walking and/or transfers;A lot of help with bathing/dressing/bathroom   Can travel by private vehicle        Equipment Recommendations  None recommended by PT    Recommendations for Other Services       Precautions / Restrictions Precautions Precautions: Fall Precaution Comments: chronic L side weakness 2* CVA     Mobility  Bed Mobility Overal bed mobility: Needs Assistance Bed Mobility: Rolling, Supine to Sit, Sit to Supine Rolling: Mod assist   Supine to sit: Mod assist, HOB elevated, Used rails Sit to supine: Max assist, HOB elevated   General bed mobility comments: Assist for LEs and trunk. Increased time.    Transfers Overall transfer level: Needs assistance Equipment used: Rolling walker (2 wheels) Transfers: Sit to/from Stand Sit to Stand: Mod assist, +2 safety/equipment           General transfer comment: Sit to stand x 2. Cues for safety, hand placement. Increased time. Assist to rise, steady, control descent. Legs braced against bed for steadying support.    Ambulation/Gait Ambulation/Gait assistance: Min assist, +2 safety/equipment    Assistive device: Rolling walker (2 wheels)         General Gait Details: Side steps along bedside with use of RW. Pt primarily supported herself with R hand on walker. Assist to stabilize and manage RW.   Stairs             Wheelchair Mobility     Tilt Bed    Modified Rankin (Stroke Patients Only)       Balance Overall balance assessment: Needs assistance         Standing balance support: Reliant on assistive device for balance, During functional activity Standing balance-Leahy Scale: Poor                              Cognition Arousal: Alert Behavior During Therapy: WFL for tasks assessed/performed Overall Cognitive Status: Within Functional Limits for tasks assessed                                          Exercises      General Comments        Pertinent Vitals/Pain Pain Assessment Pain Assessment: Faces Faces Pain Scale: Hurts a little bit Pain Location: L UE Pain Descriptors / Indicators: Discomfort Pain Intervention(s): Monitored during session    Home Living                          Prior Function  PT Goals (current goals can now be found in the care plan section) Progress towards PT goals: Progressing toward goals    Frequency    Min 1X/week      PT Plan      Co-evaluation              AM-PAC PT "6 Clicks" Mobility   Outcome Measure  Help needed turning from your back to your side while in a flat bed without using bedrails?: A Lot   Help needed moving to and from a bed to a chair (including a wheelchair)?: Total Help needed standing up from a chair using your arms (e.g., wheelchair or bedside chair)?: A Lot Help needed to walk in hospital room?: Total Help needed climbing 3-5 steps with a railing? : Total 6 Click Score: 7    End of Session Equipment Utilized During Treatment: Gait belt Activity Tolerance: Patient tolerated treatment well Patient left: in  bed;with call bell/phone within reach;with bed alarm set   PT Visit Diagnosis: Pain;Hemiplegia and hemiparesis;Muscle weakness (generalized) (M62.81);History of falling (Z91.81) Hemiplegia - Right/Left: Left     Time: 1914-7829 PT Time Calculation (min) (ACUTE ONLY): 21 min  Charges:    $Therapeutic Activity: 8-22 mins PT General Charges $$ ACUTE PT VISIT: 1 Visit                         Faye Ramsay, PT Acute Rehabilitation  Office: [phone]

## 2022-10-16 NOTE — Telephone Encounter (Addendum)
[facility] fax received stating Ozempic was approved 10/16/22-10/16/23. Deatiled message left on pharmacy line with [facility] infroming them of approval. Letter can be located under media

## 2022-10-16 NOTE — Telephone Encounter (Signed)
Forms signed and placed in outgoing faxes box to be faxed as requested.

## 2022-10-16 NOTE — Plan of Care (Signed)
  Problem: Activity: Goal: Ability to return to baseline activity level will improve Outcome: Progressing   Problem: Education: Goal: Knowledge of General Education information will improve Description: Including pain rating scale, medication(s)/side effects and non-pharmacologic comfort measures Outcome: Progressing   Problem: Clinical Measurements: Goal: Ability to maintain clinical measurements within normal limits will improve Outcome: Progressing

## 2022-10-17 ENCOUNTER — Telehealth: Payer: Self-pay

## 2022-10-17 NOTE — Progress Notes (Signed)
PTAR on the unit to pick the patient up to transport home. Hygiene/Pericare provided. Patient belongings given to transport and left the unit.

## 2022-10-17 NOTE — Transitions of Care (Post Inpatient/ED Visit) (Signed)
10/17/2022  Name: Lori [name] MRN: [id number] DOB: 1971-01-20  Today's TOC FU Call Status: Today's TOC FU Call Status:: Successful TOC FU Call Completed TOC FU Call Complete Date: 10/17/22 Patient's Name and Date of Birth confirmed.  Transition Care Management Follow-up Telephone Call Date of Discharge: 10/16/22 Discharge Facility: [facility]) Type of Discharge: Inpatient Admission Primary Inpatient Discharge Diagnosis:: pancreatitis How have you been since you were released from the hospital?: Better Any questions or concerns?: No  Items Reviewed: Did you receive and understand the discharge instructions provided?: Yes Medications obtained,verified, and reconciled?: Yes (Medications Reviewed) Any new allergies since your discharge?: No Dietary orders reviewed?: Yes Do you have support at home?: Yes Name of Support/Comfort Primary Source: aid  Medications Reviewed Today: Medications Reviewed Today     Reviewed by Karena Addison, LPN (Licensed Practical Nurse) on 10/17/22 at 1123  Med List Status: <None>   Medication Order Taking? Sig Documenting Provider Last Dose Status Informant  albuterol (PROVENTIL) (2.5 MG/3ML) 0.083% nebulizer solution [id number] No Take 3 mLs (2.5 mg total) by nebulization every 6 (six) hours as needed for wheezing or shortness of breath. Ngetich, Dinah C, NP 10/11/2022 Active Self  albuterol (VENTOLIN HFA) 108 (90 Base) MCG/ACT inhaler [id number]  INHALE 2 PUFFS BY MOUTH EVERY 6 HOURS AS NEEDED FOR WHEEZING AND FOR SHORTNESS OF BREATH Ngetich, Dinah C, NP  Active   atorvastatin (LIPITOR) 80 MG tablet [id number] No Take 1 tablet (80 mg total) by mouth daily. Ngetich, Dinah C, NP 10/11/2022 Active Self  baclofen (LIORESAL) 10 MG tablet [id number] No Take 1 tablet (10 mg total) by mouth 3 (three) times daily. X 1 week, then 10 mg TID-for spasticity- can increase  the dose if you call me to discuss Erick Blinks, DO 10/11/2022 Active Self  blood glucose  meter kit and supplies [id number] No 1 each by Other route as directed. Dispense based on patient and insurance preference. Use up to four times daily as directed. (FOR ICD-10 E10.9, E11.9). Ngetich, Donalee Citrin, NP Taking Active Self  budesonide (PULMICORT) 0.25 MG/2ML nebulizer solution [id number] No Take 2 mLs (0.25 mg total) by nebulization 2 (two) times daily. Ngetich, Dinah C, NP unknown Active Self  clopidogrel (PLAVIX) 75 MG tablet [id number] No Take 1 tablet (75 mg total) by mouth daily. Ngetich, Dinah C, NP 10/11/2022 09:00 am Active Self  Continuous Glucose Sensor (FREESTYLE LIBRE 14 DAY SENSOR) MISC [id number] No 1 each by Does not apply route as needed. DX: E11.69 Sharon Seller, NP Taking Active Self  cyclobenzaprine (FLEXERIL) 5 MG tablet [id number] No Take 1 tablet (5 mg total) by mouth 3 (three) times daily as needed for muscle spasms. Sherryll Burger, Pratik D, DO 10/11/2022 Active Self  diclofenac Sodium (VOLTAREN) 1 % GEL [id number] No Apply 4 g topically 4 (four) times daily. Ngetich, Dinah C, NP Past Week Active Self  escitalopram (LEXAPRO) 20 MG tablet [id number] No Take 1 tablet (20 mg total) by mouth every morning. Ngetich, Dinah C, NP 10/11/2022 Active Self  ezetimibe (ZETIA) 10 MG tablet [id number] No Take 1 tablet (10 mg total) by mouth daily. Ngetich, Dinah C, NP 10/11/2022 Active Self  gabapentin (NEURONTIN) 100 MG capsule [id number] No Take 1 capsule (100 mg total) by mouth 3 (three) times daily. Ngetich, Dinah C, NP 10/11/2022 Active Self  insulin lispro (HUMALOG) 100 UNIT/ML KwikPen [id number] No Inject 15 Units into the skin 3 (three) times daily with meals. For blood sugars > 150 Ngetich, Dinah C, NP 10/11/2022  Active Self  Insulin Pen Needle 32G X 4 MM MISC [id number] No Use to inject insulin 4 times daily as directed. Ngetich, Donalee Citrin, NP Taking Active Self  KLAYESTA powder [id number] No Apply 1 Application topically 2 (two) times daily. Affected areas on breast fold, groin, and sacral areas. [provider] Past Week Active Self  LANTUS SOLOSTAR 100 UNIT/ML Solostar Pen [id number] No INJECT 40 UNITS SUBCUTANEOUSLY AT BEDTIME  Patient taking differently: Inject 40 Units into the skin at bedtime.   Ngetich, Donalee Citrin, NP Past Week Active Self  LORazepam (ATIVAN) 0.5 MG tablet [id number] No Take 1 tablet (0.5 mg total) by mouth at bedtime. Ngetich, Donalee Citrin, NP Past Week Active Self  methimazole (TAPAZOLE) 5 MG tablet [id number] No Take 1 tablet (5 mg total) by mouth every morning. Ngetich, Dinah C, NP 10/12/2022 Active Self  omeprazole (PRILOSEC) 20 MG capsule [id number] No Take 1 capsule (20 mg total) by mouth daily. Ngetich, Dinah C, NP 10/11/2022 Active Self  polyethylene glycol powder (GLYCOLAX/MIRALAX) 17 GM/SCOOP powder [id number] No Take 17 g by mouth daily. Hold for loose stool Ngetich, Dinah C, NP Past Week Active Self  ranolazine (RANEXA) 500 MG 12 hr tablet [id number] No Take 1 tablet (500 mg total) by mouth 2 (two) times daily. [place], Eric J, DO 10/11/2022 Active   rivaroxaban (XARELTO) 10 MG TABS tablet [id number] No Take 1 tablet (10 mg total) by mouth daily. Ngetich, Dinah C, NP 10/11/2022 09:00 am Active Self  tamsulosin (FLOMAX) 0.4 MG CAPS capsule [id number] No Take 1 capsule (0.4 mg total) by mouth at bedtime. Ngetich, Donalee Citrin, NP Past Week Active Self  topiramate (TOPAMAX) 25 MG tablet [id number] No Take 1 tablet (25 mg total) by mouth 2 (two) times daily. Sherryll Burger, Pratik D, DO 10/11/2022 Active Self  zinc oxide (BALMEX) 11.3 % CREA cream [id number] No Apply 1 Application topically 2 (two) times daily. Ngetich, Donalee Citrin, NP 10/11/2022 Active Self  Med List Note Darryl Nestle, CPhT 04/08/21 1524): Marland Kitchen             Home Care and Equipment/Supplies: Were Home Health Services Ordered?: Yes Name of Home Health Agency:: [facility] Has Agency set up a time to come to your home?: No Any new equipment or medical supplies ordered?: NA  Functional Questionnaire: Do you need assistance with  bathing/showering or dressing?: Yes Do you need assistance with meal preparation?: Yes Do you need assistance with eating?: No Do you have difficulty maintaining continence: Yes Do you need assistance with getting out of bed/getting out of a chair/moving?: Yes Do you have difficulty managing or taking your medications?: Yes  Follow up appointments reviewed: Date of PCP follow-up appointment?: 10/28/22 Follow-up Provider: [facility] Follow-up appointment confirmed?: NA Do you need transportation to your follow-up appointment?: No Do you understand care options if your condition(s) worsen?: Yes-patient verbalized understanding    SIGNATURE Karena Addison, LPN [facility] Nurse Health Advisor Direct Dial [phone]

## 2022-10-18 ENCOUNTER — Other Ambulatory Visit: Payer: Self-pay | Admitting: Family

## 2022-10-21 ENCOUNTER — Telehealth: Payer: Self-pay

## 2022-10-21 NOTE — Transitions of Care (Post Inpatient/ED Visit) (Signed)
10/21/2022  Name: Lori [name] MRN: [id number] DOB: [birth year]  Today's TOC FU Call Status: TOC FU Call Complete Date: 10/21/22 Patient's Name and Date of Birth confirmed.  Transition Care Management Follow-up Telephone Call Date of Discharge: 10/17/22 Discharge Facility: [facility]) Type of Discharge: Inpatient Admission Primary Inpatient Discharge Diagnosis:: pancreatitis How have you been since you were released from the hospital?: Better Any questions or concerns?: No  Items Reviewed: Did you receive and understand the discharge instructions provided?: Yes Medications obtained,verified, and reconciled?: Yes (Medications Reviewed) Any new allergies since your discharge?: No Dietary orders reviewed?: Yes Do you have support at home?: Yes People in Home: spouse  Medications Reviewed Today: Medications Reviewed Today     Reviewed by Karena Addison, LPN (Licensed Practical Nurse) on 10/21/22 at 1435  Med List Status: <None>   Medication Order Taking? Sig Documenting Provider Last Dose Status Informant  albuterol (PROVENTIL) (2.5 MG/3ML) 0.083% nebulizer solution [id number] No Take 3 mLs (2.5 mg total) by nebulization every 6 (six) hours as needed for wheezing or shortness of breath. Ngetich, Dinah C, NP 10/11/2022 Active Self  albuterol (VENTOLIN HFA) 108 (90 Base) MCG/ACT inhaler [id number]  INHALE 2 PUFFS BY MOUTH EVERY 6 HOURS AS NEEDED FOR WHEEZING AND FOR SHORTNESS OF BREATH Ngetich, Dinah C, NP  Active   atorvastatin (LIPITOR) 80 MG tablet [id number] No Take 1 tablet (80 mg total) by mouth daily. Ngetich, Dinah C, NP 10/11/2022 Active Self  baclofen (LIORESAL) 10 MG tablet [id number] No Take 1 tablet (10 mg total) by mouth 3 (three) times daily. X 1 week, then 10 mg TID-for spasticity- can increase  the dose if you call me to discuss Erick Blinks, DO 10/11/2022 Active Self  blood glucose meter kit and supplies [id number] No 1 each by Other route as directed. Dispense  based on patient and insurance preference. Use up to four times daily as directed. (FOR ICD-10 E10.9, E11.9). Ngetich, Donalee Citrin, NP Taking Active Self  budesonide (PULMICORT) 0.25 MG/2ML nebulizer solution [id number] No Take 2 mLs (0.25 mg total) by nebulization 2 (two) times daily. Ngetich, Dinah C, NP unknown Active Self  clopidogrel (PLAVIX) 75 MG tablet [id number] No Take 1 tablet (75 mg total) by mouth daily. Ngetich, Dinah C, NP 10/11/2022 09:00 am Active Self  Continuous Glucose Sensor (FREESTYLE LIBRE 14 DAY SENSOR) MISC [id number] No 1 each by Does not apply route as needed. DX: E11.69 Sharon Seller, NP Taking Active Self  cyclobenzaprine (FLEXERIL) 5 MG tablet [id number] No Take 1 tablet (5 mg total) by mouth 3 (three) times daily as needed for muscle spasms. Sherryll Burger, Pratik D, DO 10/11/2022 Active Self  diclofenac Sodium (VOLTAREN) 1 % GEL [id number] No Apply 4 g topically 4 (four) times daily. Ngetich, Dinah C, NP Past Week Active Self  escitalopram (LEXAPRO) 20 MG tablet [id number] No Take 1 tablet (20 mg total) by mouth every morning. Ngetich, Dinah C, NP 10/11/2022 Active Self  ezetimibe (ZETIA) 10 MG tablet [id number] No Take 1 tablet (10 mg total) by mouth daily. Ngetich, Dinah C, NP 10/11/2022 Active Self  gabapentin (NEURONTIN) 100 MG capsule [id number] No Take 1 capsule (100 mg total) by mouth 3 (three) times daily. Ngetich, Dinah C, NP 10/11/2022 Active Self  insulin lispro (HUMALOG) 100 UNIT/ML KwikPen [id number] No Inject 15 Units into the skin 3 (three) times daily with meals. For blood sugars > 150 Ngetich, Dinah C, NP 10/11/2022 Active Self  Insulin Pen Needle 32G X 4 MM MISC [id number]  No Use to inject insulin 4 times daily as directed. Ngetich, Donalee Citrin, NP Taking Active Self  KLAYESTA powder [id number] No Apply 1 Application topically 2 (two) times daily. Affected areas on breast fold, groin, and sacral areas. [provider] Past Week Active Self  LANTUS SOLOSTAR 100 UNIT/ML Solostar Pen  [id number] No INJECT 40 UNITS SUBCUTANEOUSLY AT BEDTIME  Patient taking differently: Inject 40 Units into the skin at bedtime.   Ngetich, Donalee Citrin, NP Past Week Active Self  LORazepam (ATIVAN) 0.5 MG tablet [id number] No Take 1 tablet (0.5 mg total) by mouth at bedtime. Ngetich, Donalee Citrin, NP Past Week Active Self  methimazole (TAPAZOLE) 5 MG tablet [id number] No Take 1 tablet (5 mg total) by mouth every morning. Ngetich, Dinah C, NP 10/12/2022 Active Self  omeprazole (PRILOSEC) 20 MG capsule [id number] No Take 1 capsule (20 mg total) by mouth daily. Ngetich, Dinah C, NP 10/11/2022 Active Self  polyethylene glycol powder (GLYCOLAX/MIRALAX) 17 GM/SCOOP powder [id number] No Take 17 g by mouth daily. Hold for loose stool Ngetich, Dinah C, NP Past Week Active Self  ranolazine (RANEXA) 500 MG 12 hr tablet [id number] No Take 1 tablet (500 mg total) by mouth 2 (two) times daily. [place], Eric J, DO 10/11/2022 Active   rivaroxaban (XARELTO) 10 MG TABS tablet [id number] No Take 1 tablet (10 mg total) by mouth daily. Ngetich, Dinah C, NP 10/11/2022 09:00 am Active Self  tamsulosin (FLOMAX) 0.4 MG CAPS capsule [id number] No Take 1 capsule (0.4 mg total) by mouth at bedtime. Ngetich, Donalee Citrin, NP Past Week Active Self  topiramate (TOPAMAX) 25 MG tablet [id number] No Take 1 tablet (25 mg total) by mouth 2 (two) times daily. Sherryll Burger, Pratik D, DO 10/11/2022 Active Self  zinc oxide (BALMEX) 11.3 % CREA cream [id number] No Apply 1 Application topically 2 (two) times daily. Ngetich, Donalee Citrin, NP 10/11/2022 Active Self  Med List Note Darryl Nestle, CPhT 04/08/21 1524): Marland Kitchen             Home Care and Equipment/Supplies: Were Home Health Services Ordered?: NA Any new equipment or medical supplies ordered?: NA  Functional Questionnaire: Do you need assistance with bathing/showering or dressing?: No Do you need assistance with meal preparation?: No Do you need assistance with eating?: No Do you have difficulty maintaining continence:  No Do you need assistance with getting out of bed/getting out of a chair/moving?: No Do you have difficulty managing or taking your medications?: No  Follow up appointments reviewed: PCP Follow-up appointment confirmed?: Yes Date of PCP follow-up appointment?: 10/28/22 Follow-up Provider: [facility] Follow-up appointment confirmed?: NA Do you need transportation to your follow-up appointment?: No Do you understand care options if your condition(s) worsen?: Yes-patient verbalized understanding    SIGNATURE Karena Addison, LPN [facility] Nurse Health Advisor Direct Dial [phone]

## 2022-10-22 ENCOUNTER — Ambulatory Visit: Payer: Medicaid Other | Admitting: Physician Assistant

## 2022-10-24 ENCOUNTER — Encounter (HOSPITAL_COMMUNITY): Payer: Self-pay

## 2022-10-25 ENCOUNTER — Telehealth: Payer: Self-pay | Admitting: *Deleted

## 2022-10-25 NOTE — Telephone Encounter (Signed)
Call placed to pt regarding message below.  Left pt a message to call back. Have her scheduled for 11/05/22 2:45 with Perlie Gold.

## 2022-10-25 NOTE — Telephone Encounter (Signed)
-----   Message from Lori [name] sent at 10/24/2022  8:27 AM EDT ----- It looks like she has her Myocardial PET 10/29/22. Can you arrange follow up with me or Evan after the test?

## 2022-10-28 ENCOUNTER — Encounter: Payer: Self-pay | Admitting: Adult Health

## 2022-10-28 ENCOUNTER — Encounter: Payer: Medicaid Other | Admitting: Adult Health

## 2022-10-28 ENCOUNTER — Telehealth (HOSPITAL_COMMUNITY): Payer: Self-pay | Admitting: *Deleted

## 2022-10-28 NOTE — Progress Notes (Signed)
I connected with  Lori [name] on 10/28/22 by a video enabled telemedicine application and verified that I am speaking with the correct person using two identifiers.   I discussed the limitations of evaluation and management by telemedicine. The patient expressed understanding and agreed to proceed.         Physical Exam: Vitals:   10/28/22 0922  Height: 5\' 2"  (1.575 m)   Body mass index is 38.71 kg/m. Physical Exam  Labs reviewed: Basic Metabolic Panel: Recent Labs    11/02/21 0246 11/02/21 2329 11/05/21 0259 11/06/21 0307 04/30/22 1128 04/30/22 1450 10/06/22 1649 10/07/22 0315 10/12/22 0714 10/13/22 0325 10/14/22 0333 10/15/22 0330  NA  --    < > 138   < > 128*   < >  --    < > 135 134* 133* 133*  K  --    < > 3.3*   < > 6.9*   < >  --    < > 3.7 3.6 3.5 3.8  CL  --    < > 112*   < > 95*   < >  --    < > 99 102 103 104  CO2  --    < > 20*   < > 19*   < >  --    < > 23 23 21* 19*  GLUCOSE  --    < > 113*   < > 313*   < >  --    < > 183* 102* 190* 244*  BUN  --    < > 20   < > 66*   < >  --    < > 24* 17 15 13   CREATININE  --    < > 0.69   < > 2.71*   < >  --    < > 0.91 0.81 0.78 0.84  CALCIUM  --    < > 8.3*   < > 9.3   < >  --    < > 8.9 8.3* 8.0* 8.3*  MG  --    < > 2.1  --   --   --   --   --  1.8 2.1  --   --   PHOS  --   --   --   --   --   --   --   --  3.2 3.9  --   --   TSH 1.399  --   --   --  1.865  --  0.816  --   --   --   --   --    < > = values in this interval not displayed.   Liver Function Tests: Recent Labs    10/13/22 0325 10/14/22 0333 10/15/22 0330  AST 10* 9* 11*  ALT 13 12 13   ALKPHOS 60 55 60  BILITOT 0.8 0.8 0.4  PROT 5.7* 5.7* 6.0*  ALBUMIN 2.9* 2.6* 2.6*   Recent Labs    10/11/22 1903 10/12/22 0714 10/15/22 0330  LIPASE 179* 133* 197*   No results for input(s): "AMMONIA" in the last 8760 hours. CBC: Recent Labs    10/11/22 1903 10/12/22 0714 10/13/22 0325 10/14/22 0333 10/15/22 0330  WBC 14.3*   < > 12.2*  11.8* 9.5  NEUTROABS 9.4*  --  7.5  --  6.2  HGB 13.1   < > 10.2* 9.6* 10.1*  HCT 41.7   < > 31.9* 29.7* 30.4*  MCV  80.8   < > 79.6* 78.6* 77.2*  PLT 399   < > 300 298 222   < > = values in this interval not displayed.   Lipid Panel: Recent Labs    03/01/22 0828 10/07/22 0311  CHOL 148 132  HDL 44 35*  LDLCALC 79 66  TRIG 143 157*  CHOLHDL 3.4 3.8   Lab Results  Component Value Date   HGBA1C 9.8 (H) 08/27/2022    Procedures since last visit: DG Abd 1 View  Result Date: 10/15/2022 CLINICAL DATA:  Constipation. EXAM: ABDOMEN - 1 VIEW COMPARISON:  CT abdomen and pelvis 10/11/2022 FINDINGS: A small to moderate amount of stool is present throughout the colon and rectum. No dilated loops of bowel are seen to suggest obstruction. Right upper quadrant abdominal surgical clips and sternal wires are noted. No acute osseous abnormality is seen. IMPRESSION: Small-to-moderate stool burden.  No evidence of bowel obstruction. Electronically Signed   By: Sebastian Ache M.D.   On: 10/15/2022 14:34   DG CHEST PORT 1 VIEW  Result Date: 10/13/2022 CLINICAL DATA:  Shortness of breath.  Possible aspiration. EXAM: PORTABLE CHEST 1 VIEW COMPARISON:  10/11/2022 FINDINGS: The heart size and mediastinal contours are within normal limits. Prior median sternotomy. Low lung volumes noted. Both lungs are clear. The visualized skeletal structures are unremarkable. IMPRESSION: Low lung volumes. No active disease. Electronically Signed   By: Danae Orleans M.D.   On: 10/13/2022 10:20   CT ABDOMEN PELVIS WO CONTRAST  Result Date: 10/11/2022 CLINICAL DATA:  Nonbilious vomiting. 52 year old female. Concern for aspiration. EXAM: CT ABDOMEN AND PELVIS WITHOUT CONTRAST TECHNIQUE: Multidetector CT imaging of the abdomen and pelvis was performed following the standard protocol without IV contrast. RADIATION DOSE REDUCTION: This exam was performed according to the departmental dose-optimization program which includes automated  exposure control, adjustment of the mA and/or kV according to patient size and/or use of iterative reconstruction technique. COMPARISON:  CTs without contrast dated 07/04/2022 and 07/10/2021 FINDINGS: Lower chest: Sternotomy and old CABG changes with heavy native CAD particularly for age. There are calcifications in the aortic valve plane. The cardiac size is normal. There is no pericardial effusion. Small hiatal hernia. Linear scarring or atelectasis again seen in the lower lobes, right middle lobe but no acute lung base process. Hepatobiliary: No focal liver abnormality is seen without contrast. The gallbladder and bile ducts are unremarkable. Pancreas: Interval new finding of fatty stranding around the entire pancreas, development of mild generalized pancreatic edema. Findings consistent with acute interstitial pancreatitis. No pancreatic ductal dilatation is seen. No peripancreatic hemorrhage or abscess is seen. There is trace peripancreatic reactive fluid tracking slightly inferiorly. No pancreatic mass is seen without contrast. Spleen: No abnormality. Adrenals/Urinary Tract: Surgical clips from prior right adrenalectomy. Unremarkable left adrenal gland. There is no contour deforming abnormality of either kidney. Congenital malrotation of the right kidney. There is no evidence of urinary stones or obstruction. Unremarkable bladder. Stomach/Bowel: The stomach is contracted. There is a 2.8 cm diverticulum off the medial aspect of the second portion, containing debris and trace fluid. Rest of the small bowel is unremarkable and normal caliber. The appendix is normal and well seen. Mild-to-moderate fecal stasis ascending and transverse colon, left colonic diverticula without evidence of diverticulitis again noted. Vascular/Lymphatic: Extensive aortic and iliac/branch vessel atherosclerosis particularly for a 52 year old. No AAA. No lymphadenopathy is seen. Reproductive: Status post hysterectomy. No adnexal masses.  Other: Umbilical rectus diastasis and outward protrusion is again noted with several small right  of the midline fat hernias and a small midline umbilical fat hernia. There is a small right inguinal fat hernia. Small caliber small bowel segments extend into the area of midline outward protrusion at the umbilicus but there is no incarcerated hernia. There has no pelvic ascites. No free hemorrhage, free air or abscess. Musculoskeletal: Possibly associated with the patient's C-section scar, in the deep subcutaneous plane in the midline abutting the low anterior pelvic wall there is an old irregular masslike density measuring 3.5 x 2 cm on 2:91, unchanged. This could represent a fibroma, dense scar related to old hematoma, or an extra-abdominal endometrioma. In any case it is entirely stable. There is slight lumbar levoscoliosis, mild osteopenia. Mild degenerative change lumbar spine. IMPRESSION: 1. Acute interstitial pancreatitis without evidence of hemorrhage or abscess. Minimal nonlocalizing reactive fluid. 2. Constipation and diverticulosis. 3. Age advanced atherosclerosis.  Prior CABG. 4. Umbilical rectus diastasis and outward protrusion with several small right of the midline fat hernias and a small midline umbilical fat hernia. Small caliber small bowel segments extend into the area of midline outward protrusion at the umbilicus but there is no incarcerated hernia or SBO. 5. Small hiatal hernia. 6. Stable 3.5 x 2 cm irregular masslike density in the deep subcutaneous plane abutting the low anterior pelvic wall. This could represent a fibroma, dense scar such as related to old hematoma, or an extra-abdominal endometrioma. In any case it is entirely unchanged. Aortic Atherosclerosis (ICD10-I70.0). Electronically Signed   By: Almira Bar M.D.   On: 10/11/2022 21:51   DG Chest Portable 1 View  Result Date: 10/11/2022 CLINICAL DATA:  Vomiting with possible aspiration. EXAM: PORTABLE CHEST 1 VIEW COMPARISON:   October 06, 2022 FINDINGS: Limited study secondary to patient rotation. Multiple sternal wires and vascular clips are seen. The heart size and mediastinal contours are within normal limits. Low lung volumes are present without evidence of an acute infiltrate, pleural effusion or pneumothorax. The visualized skeletal structures are unremarkable. IMPRESSION: 1. Evidence of prior median sternotomy/CABG. 2. Low lung volumes without acute or active cardiopulmonary disease. Electronically Signed   By: Aram Candela M.D.   On: 10/11/2022 20:57   ECHOCARDIOGRAM COMPLETE  Result Date: 10/07/2022    ECHOCARDIOGRAM REPORT   Patient Name:   Lori [name] Date of Exam: 10/07/2022 Medical Rec #:  [id number]           Height:       62.0 in Accession #:    [id number]          Weight:       201.9 lb Date of Birth:  1970/11/04            BSA:          1.920 m Patient Age:    51 years            BP:           84/63 mmHg Patient Gender: F                   HR:           86 bpm. Exam Location:  Inpatient Procedure: 2D Echo, Color Doppler, Cardiac Doppler and Intracardiac            Opacification Agent Indications:    Chest Pain  History:        Patient has prior history of Echocardiogram examinations, most  recent 01/21/2021. CAD, Stroke, Signs/Symptoms:Chest Pain; Risk                 Factors:Diabetes, Hypertension and Dyslipidemia.  Sonographer:    Milbert Coulter Referring Phys: [id number] SUBRINA SUNDIL  Sonographer Comments: Technically difficult study due to poor echo windows, suboptimal apical window, suboptimal subcostal window and patient is obese. Image acquisition challenging due to patient body habitus and patient could not tollerate much pressure in the apical windows due to broken skin and rash. IMPRESSIONS  1. Left ventricular ejection fraction, by estimation, is 60 to 65%. The left ventricle has normal function. The left ventricle has no regional wall motion abnormalities. There is mild concentric left  ventricular hypertrophy. Left ventricular diastolic parameters were normal.  2. Right ventricular systolic function is normal. The right ventricular size is normal. There is normal pulmonary artery systolic pressure. The estimated right ventricular systolic pressure is 21.0 mmHg.  3. The mitral valve is grossly normal. Trivial mitral valve regurgitation.  4. The aortic valve is tricuspid. There is mild calcification of the aortic valve. Aortic valve regurgitation is not visualized. Aortic valve sclerosis/calcification is present, without any evidence of aortic stenosis. Aortic valve mean gradient measures 4.0 mmHg.  5. The inferior vena cava is normal in size with greater than 50% respiratory variability, suggesting right atrial pressure of 3 mmHg.  6. Cannot exclude a small PFO. Comparison(s): Prior images reviewed side by side. LVEF 60-65%. Normal estimated RVSP. Cannot exclude small PFO. FINDINGS  Left Ventricle: Left ventricular ejection fraction, by estimation, is 60 to 65%. The left ventricle has normal function. The left ventricle has no regional wall motion abnormalities. Definity contrast agent was given IV to delineate the left ventricular  endocardial borders. The left ventricular internal cavity size was normal in size. There is mild concentric left ventricular hypertrophy. Left ventricular diastolic parameters were normal. Right Ventricle: The right ventricular size is normal. No increase in right ventricular wall thickness. Right ventricular systolic function is normal. There is normal pulmonary artery systolic pressure. The tricuspid regurgitant velocity is 2.12 m/s, and  with an assumed right atrial pressure of 3 mmHg, the estimated right ventricular systolic pressure is 21.0 mmHg. Left Atrium: Left atrial size was normal in size. Right Atrium: Right atrial size was normal in size. Pericardium: Trivial pericardial effusion is present. The pericardial effusion is posterior to the left ventricle.  Presence of epicardial fat layer. Mitral Valve: The mitral valve is grossly normal. Trivial mitral valve regurgitation. Tricuspid Valve: The tricuspid valve is grossly normal. Tricuspid valve regurgitation is mild. Aortic Valve: The aortic valve is tricuspid. There is mild calcification of the aortic valve. Aortic valve regurgitation is not visualized. Aortic valve sclerosis/calcification is present, without any evidence of aortic stenosis. Aortic valve mean gradient measures 4.0 mmHg. Aortic valve peak gradient measures 8.3 mmHg. Aortic valve area, by VTI measures 1.58 cm. Pulmonic Valve: The pulmonic valve was grossly normal. Pulmonic valve regurgitation is trivial. Aorta: The aortic root and ascending aorta are structurally normal, with no evidence of dilitation. Venous: The inferior vena cava is normal in size with greater than 50% respiratory variability, suggesting right atrial pressure of 3 mmHg. IAS/Shunts: Cannot exclude a small PFO.  LEFT VENTRICLE PLAX 2D LVIDd:         3.50 cm   Diastology LVIDs:         2.10 cm   LV e' medial:    6.74 cm/s LV PW:         1.20  cm   LV E/e' medial:  8.9 LV IVS:        1.00 cm   LV e' lateral:   8.27 cm/s LVOT diam:     1.90 cm   LV E/e' lateral: 7.2 LV SV:         40 LV SV Index:   21 LVOT Area:     2.84 cm  LEFT ATRIUM         Index LA diam:    2.90 cm 1.51 cm/m  AORTIC VALVE AV Area (Vmax):    1.51 cm AV Area (Vmean):   1.50 cm AV Area (VTI):     1.58 cm AV Vmax:           144.00 cm/s AV Vmean:          97.000 cm/s AV VTI:            0.253 m AV Peak Grad:      8.3 mmHg AV Mean Grad:      4.0 mmHg LVOT Vmax:         76.50 cm/s LVOT Vmean:        51.400 cm/s LVOT VTI:          0.141 m LVOT/AV VTI ratio: 0.56  AORTA Ao Root diam: 2.90 cm Ao Asc diam:  2.80 cm MITRAL VALVE               TRICUSPID VALVE MV Area (PHT): 3.50 cm    TR Peak grad:   18.0 mmHg MV Decel Time: 217 msec    TR Vmax:        212.00 cm/s MV E velocity: 59.70 cm/s MV A velocity: 58.70 cm/s  SHUNTS  MV E/A ratio:  1.02        Systemic VTI:  0.14 m                            Systemic Diam: 1.90 cm Nona Dell MD Electronically signed by Nona Dell MD Signature Date/Time: 10/07/2022/4:47:43 PM    Final    DG Chest 2 View  Result Date: 10/06/2022 CLINICAL DATA:  Left-sided chest pain radiates to the neck. EXAM: CHEST - 2 VIEW COMPARISON:  09/06/2022 FINDINGS: The lungs are clear without focal pneumonia, edema, pneumothorax or pleural effusion. The cardiopericardial silhouette is within normal limits for size. Status post CABG. IMPRESSION: No active cardiopulmonary disease. Electronically Signed   By: Kennith Center M.D.   On: 10/06/2022 17:51    This encounter was created in error - please disregard.

## 2022-10-28 NOTE — Telephone Encounter (Signed)
Reaching out to patient to offer assistance regarding upcoming cardiac imaging study; pt verbalizes understanding of appt date/time, parking situation and where to check in, pre-test NPO status, and verified current allergies; name and call back number provided for further questions should they arise  Larey Brick RN Navigator Cardiac Imaging [facility] [phone] office [phone] cell  Patient aware to avoid caffeine 12 hours prior to her cardiac PET scan.

## 2022-10-28 NOTE — Progress Notes (Signed)
This service is provided via telemedicine  No vital signs collected/recorded due to the encounter was a telemedicine visit.   Location of patient (ex: home, work):  Home   Patient consents to a telephone visit:  Yes, 10/28/22  Location of the provider (ex: office, home):  [facility]  Name of any referring provider:  Ngetich, Donalee Citrin, NP   Names of all persons participating in the telemedicine service and their role in the encounter: Tauna Macfarlane B/CMA,Monina Grayce Sessions, NP and patient  Time spent on call:  11 minutes

## 2022-10-29 ENCOUNTER — Telehealth: Payer: Self-pay

## 2022-10-29 ENCOUNTER — Ambulatory Visit (HOSPITAL_COMMUNITY): Admission: RE | Admit: 2022-10-29 | Payer: 59 | Source: Ambulatory Visit

## 2022-10-29 NOTE — Telephone Encounter (Signed)
I left a detailed message informing patient she will need an appointment to discuss. Mychart message sent as well

## 2022-10-29 NOTE — Telephone Encounter (Signed)
Patient called stating she needs referral order sent to [facility] for PT and OT

## 2022-10-29 NOTE — Telephone Encounter (Signed)
Need appointment for ED follow up then order PT/OT.Office visit notes will be required.

## 2022-10-31 ENCOUNTER — Other Ambulatory Visit: Payer: Self-pay

## 2022-10-31 ENCOUNTER — Emergency Department (HOSPITAL_COMMUNITY): Payer: 59

## 2022-10-31 ENCOUNTER — Emergency Department (HOSPITAL_COMMUNITY)
Admission: EM | Admit: 2022-10-31 | Discharge: 2022-10-31 | Disposition: A | Discharge status: Home / Self Care | Payer: 59 | Attending: Emergency Medicine | Admitting: Emergency Medicine

## 2022-10-31 ENCOUNTER — Ambulatory Visit: Payer: Medicaid Other | Admitting: Family

## 2022-10-31 ENCOUNTER — Encounter (HOSPITAL_COMMUNITY): Payer: Self-pay

## 2022-10-31 DIAGNOSIS — I2510 Atherosclerotic heart disease of native coronary artery without angina pectoris: Secondary | ICD-10-CM | POA: Diagnosis not present

## 2022-10-31 DIAGNOSIS — R231 Pallor: Secondary | ICD-10-CM | POA: Diagnosis not present

## 2022-10-31 DIAGNOSIS — Z79899 Other long term (current) drug therapy: Secondary | ICD-10-CM | POA: Insufficient documentation

## 2022-10-31 DIAGNOSIS — Z794 Long term (current) use of insulin: Secondary | ICD-10-CM | POA: Insufficient documentation

## 2022-10-31 DIAGNOSIS — E119 Type 2 diabetes mellitus without complications: Secondary | ICD-10-CM | POA: Diagnosis not present

## 2022-10-31 DIAGNOSIS — K449 Diaphragmatic hernia without obstruction or gangrene: Secondary | ICD-10-CM | POA: Diagnosis not present

## 2022-10-31 DIAGNOSIS — R1030 Lower abdominal pain, unspecified: Secondary | ICD-10-CM | POA: Diagnosis not present

## 2022-10-31 DIAGNOSIS — N179 Acute kidney failure, unspecified: Secondary | ICD-10-CM | POA: Diagnosis not present

## 2022-10-31 DIAGNOSIS — G8190 Hemiplegia, unspecified affecting unspecified side: Secondary | ICD-10-CM | POA: Diagnosis not present

## 2022-10-31 DIAGNOSIS — R1110 Vomiting, unspecified: Secondary | ICD-10-CM | POA: Diagnosis not present

## 2022-10-31 DIAGNOSIS — I10 Essential (primary) hypertension: Secondary | ICD-10-CM | POA: Insufficient documentation

## 2022-10-31 DIAGNOSIS — Z7401 Bed confinement status: Secondary | ICD-10-CM | POA: Diagnosis not present

## 2022-10-31 DIAGNOSIS — Z8673 Personal history of transient ischemic attack (TIA), and cerebral infarction without residual deficits: Secondary | ICD-10-CM | POA: Insufficient documentation

## 2022-10-31 DIAGNOSIS — R109 Unspecified abdominal pain: Secondary | ICD-10-CM | POA: Insufficient documentation

## 2022-10-31 DIAGNOSIS — Z7902 Long term (current) use of antithrombotics/antiplatelets: Secondary | ICD-10-CM | POA: Insufficient documentation

## 2022-10-31 DIAGNOSIS — R110 Nausea: Secondary | ICD-10-CM

## 2022-10-31 DIAGNOSIS — R1111 Vomiting without nausea: Secondary | ICD-10-CM | POA: Diagnosis not present

## 2022-10-31 DIAGNOSIS — R000 Tachycardia, unspecified: Secondary | ICD-10-CM | POA: Diagnosis not present

## 2022-10-31 DIAGNOSIS — R112 Nausea with vomiting, unspecified: Secondary | ICD-10-CM | POA: Insufficient documentation

## 2022-10-31 DIAGNOSIS — G4489 Other headache syndrome: Secondary | ICD-10-CM | POA: Diagnosis not present

## 2022-10-31 DIAGNOSIS — Z743 Need for continuous supervision: Secondary | ICD-10-CM | POA: Diagnosis not present

## 2022-10-31 DIAGNOSIS — Z7901 Long term (current) use of anticoagulants: Secondary | ICD-10-CM | POA: Insufficient documentation

## 2022-10-31 LAB — URINALYSIS, ROUTINE W REFLEX MICROSCOPIC
Bilirubin Urine: NEGATIVE
Glucose, UA: NEGATIVE mg/dL
Hgb urine dipstick: NEGATIVE
Ketones, ur: NEGATIVE mg/dL
Leukocytes,Ua: NEGATIVE
Nitrite: NEGATIVE
Protein, ur: NEGATIVE mg/dL
Specific Gravity, Urine: 1.011 (ref 1.005–1.030)
pH: 5 (ref 5.0–8.0)

## 2022-10-31 LAB — CBG MONITORING, ED: Glucose-Capillary: 243 mg/dL — ABNORMAL HIGH (ref 70–99)

## 2022-10-31 LAB — COMPREHENSIVE METABOLIC PANEL
ALT: 21 U/L (ref 0–44)
AST: 16 U/L (ref 15–41)
Albumin: 3.5 g/dL (ref 3.5–5.0)
Alkaline Phosphatase: 90 U/L (ref 38–126)
Anion gap: 13 (ref 5–15)
BUN: 39 mg/dL — ABNORMAL HIGH (ref 6–20)
CO2: 23 mmol/L (ref 22–32)
Calcium: 8.8 mg/dL — ABNORMAL LOW (ref 8.9–10.3)
Chloride: 99 mmol/L (ref 98–111)
Creatinine, Ser: 1.73 mg/dL — ABNORMAL HIGH (ref 0.44–1.00)
GFR, Estimated: 36 mL/min — ABNORMAL LOW (ref >60)
Glucose, Bld: 248 mg/dL — ABNORMAL HIGH (ref 70–99)
Potassium: 4.3 mmol/L (ref 3.5–5.1)
Sodium: 135 mmol/L (ref 135–145)
Total Bilirubin: 0.5 mg/dL (ref 0.3–1.2)
Total Protein: 7.7 g/dL (ref 6.5–8.1)

## 2022-10-31 LAB — CBC WITH DIFFERENTIAL/PLATELET
Abs Immature Granulocytes: 0.02 10*3/uL (ref 0.00–0.07)
Basophils Absolute: 0 10*3/uL (ref 0.0–0.1)
Basophils Relative: 0 %
Eosinophils Absolute: 0.1 10*3/uL (ref 0.0–0.5)
Eosinophils Relative: 1 %
HCT: 41.1 % (ref 36.0–46.0)
Hemoglobin: 13.4 g/dL (ref 12.0–15.0)
Immature Granulocytes: 0 %
Lymphocytes Relative: 21 %
Lymphs Abs: 1.8 10*3/uL (ref 0.7–4.0)
MCH: 25.9 pg — ABNORMAL LOW (ref 26.0–34.0)
MCHC: 32.6 g/dL (ref 30.0–36.0)
MCV: 79.5 fL — ABNORMAL LOW (ref 80.0–100.0)
Monocytes Absolute: 0.5 10*3/uL (ref 0.1–1.0)
Monocytes Relative: 6 %
Neutro Abs: 6.4 10*3/uL (ref 1.7–7.7)
Neutrophils Relative %: 72 %
Platelets: 382 10*3/uL (ref 150–400)
RBC: 5.17 MIL/uL — ABNORMAL HIGH (ref 3.87–5.11)
RDW: 17.5 % — ABNORMAL HIGH (ref 11.5–15.5)
WBC: 8.9 10*3/uL (ref 4.0–10.5)
nRBC: 0 % (ref 0.0–0.2)

## 2022-10-31 LAB — BETA-HYDROXYBUTYRIC ACID: Beta-Hydroxybutyric Acid: 0.29 mmol/L — ABNORMAL HIGH (ref 0.05–0.27)

## 2022-10-31 LAB — BLOOD GAS, VENOUS
Acid-Base Excess: 1.6 mmol/L (ref 0.0–2.0)
Bicarbonate: 27.2 mmol/L (ref 20.0–28.0)
O2 Saturation: 65 %
Patient temperature: 37
pCO2, Ven: 46 mmHg (ref 44–60)
pH, Ven: 7.38 (ref 7.25–7.43)
pO2, Ven: 37 mmHg (ref 32–45)

## 2022-10-31 LAB — LIPASE, BLOOD: Lipase: 21 U/L (ref 11–51)

## 2022-10-31 MED ORDER — DICYCLOMINE HCL 20 MG PO TABS: 20.0000 mg | ORAL_TABLET | Freq: Two times a day (BID) | ORAL | 0 refills | Status: DC

## 2022-10-31 MED ORDER — DICYCLOMINE HCL 10 MG PO CAPS
10.0000 mg | ORAL_CAPSULE | Freq: Once | ORAL | Status: AC
  Administered 2022-10-31: 10 mg via ORAL
  Filled 2022-10-31: qty 1

## 2022-10-31 MED ORDER — ONDANSETRON HCL 4 MG/2ML IJ SOLN
4.0000 mg | Freq: Once | INTRAMUSCULAR | Status: AC
  Administered 2022-10-31: 4 mg via INTRAVENOUS
  Filled 2022-10-31: qty 2

## 2022-10-31 MED ORDER — ACETAMINOPHEN 325 MG PO TABS
650.0000 mg | ORAL_TABLET | Freq: Once | ORAL | Status: AC
  Administered 2022-10-31: 650 mg via ORAL
  Filled 2022-10-31: qty 2

## 2022-10-31 MED ORDER — SODIUM CHLORIDE 0.9 % IV SOLN: INTRAVENOUS | Status: DC

## 2022-10-31 MED ORDER — SENNOSIDES-DOCUSATE SODIUM 8.6-50 MG PO TABS: 1.0000 | ORAL_TABLET | Freq: Every day | ORAL | 0 refills | Status: DC

## 2022-10-31 MED ORDER — SODIUM CHLORIDE 0.9 % IV BOLUS
1000.0000 mL | Freq: Once | INTRAVENOUS | Status: AC
  Administered 2022-10-31: 1000 mL via INTRAVENOUS

## 2022-10-31 MED ORDER — FENTANYL CITRATE PF 50 MCG/ML IJ SOSY
50.0000 ug | PREFILLED_SYRINGE | Freq: Once | INTRAMUSCULAR | Status: AC
  Administered 2022-10-31: 50 ug via INTRAVENOUS
  Filled 2022-10-31: qty 1

## 2022-10-31 MED ORDER — ONDANSETRON HCL 4 MG PO TABS: 4.0000 mg | ORAL_TABLET | Freq: Four times a day (QID) | ORAL | 0 refills | Status: DC

## 2022-10-31 NOTE — ED Notes (Signed)
Report given to [place], Charity fundraiser

## 2022-10-31 NOTE — ED Notes (Signed)
Husband made aware that Pt is now in route to residence and husband is present.

## 2022-10-31 NOTE — Discharge Instructions (Signed)
You were seen in the emergency department for your nausea, vomiting and abdominal pain.  Your workup showed that you are dehydrated and otherwise your labs are normal.  We did give you fluids to rehydrate you in the emergency department.  Your CAT scan showed no signs of infection or abnormality within your abdomen to explain your pain.  You might have a viral infection or may be constipated can attributing to some of the pain.  You can take Tylenol and Bentyl as needed for pain and I have given you a stool softener that you should take.  You can take Zofran as needed for nausea.  You should follow-up with your primary doctor in the next few days to have your symptoms and your kidney function rechecked.  You should return to the emergency department for continued vomiting despite the nausea medicine, significantly worsening pain, increasing dehydration or any other new or concerning symptoms.

## 2022-10-31 NOTE — ED Triage Notes (Signed)
Patient brought from home via [facility], vomiting for 24 hours (4-5 times) with dry heaving. Denies complaints of nausea, not able to eat or drink. Hx DM not taking insulin because she can't eat. CBG 200s per EMS.  Hx multiple strokes,  DM, heart attack.

## 2022-10-31 NOTE — ED Notes (Signed)
Husband called [name],[name] (Spouse) @[phone] (Mobile) and made aware that Pt is next up on the list to be picked up and transported by [facility]. Spouse states that he is at the residence and will be available upon arrival.

## 2022-10-31 NOTE — ED Provider Notes (Signed)
[place] EMERGENCY DEPARTMENT AT [facility] Provider Note   CSN: [id number] Arrival date & time: 10/31/22  1102     History  Chief Complaint  Patient presents with   Emesis    Lori [name] is a 52 y.o. female.   Emesis    Patient has a history of diabetes hypertension, stroke, coronary artery disease, pheochromocytoma who presents to the ED for evaluation of nausea and vomiting.  Patient was admitted to the hospital earlier this month on both September 1 followed by September 6.  Patient was initially admitted on September 1 for chest pain.  She was then admitted to the hospital on September 6 for acute pancreatitis.  Patient was discharged on the 12th.  Patient states that she started having trouble with nausea and vomiting last night.  She continued this morning.  Patient states she vomited about 4-5 times.  She has not been able to eat or drink much.  She called EMS and was brought to the ED.  Patient states she has had  abd pain.  No fever.  No trouble urinating.  No difficulty breathing Patient states at baseline she is wheelchair-bound as of her prior strokes.  She has aides to help with her medical issues Home Medications Prior to Admission medications   Medication Sig Start Date End Date Taking? Authorizing Provider  albuterol (PROVENTIL) (2.5 MG/3ML) 0.083% nebulizer solution Take 3 mLs (2.5 mg total) by nebulization every 6 (six) hours as needed for wheezing or shortness of breath. 05/21/22   Ngetich, Dinah C, NP  albuterol (VENTOLIN HFA) 108 (90 Base) MCG/ACT inhaler INHALE 2 PUFFS BY MOUTH EVERY 6 HOURS AS NEEDED FOR WHEEZING AND FOR SHORTNESS OF BREATH 10/17/22   Ngetich, Dinah C, NP  atorvastatin (LIPITOR) 80 MG tablet Take 1 tablet (80 mg total) by mouth daily. 09/02/22   Ngetich, Dinah C, NP  baclofen (LIORESAL) 10 MG tablet Take 1 tablet (10 mg total) by mouth 3 (three) times daily. X 1 week, then 10 mg TID-for spasticity- can increase  the dose if you  call me to discuss 07/10/22   Maurilio Lovely D, DO  blood glucose meter kit and supplies 1 each by Other route as directed. Dispense based on patient and insurance preference. Use up to four times daily as directed. (FOR ICD-10 E10.9, E11.9). 06/24/22   Ngetich, Dinah C, NP  budesonide (PULMICORT) 0.25 MG/2ML nebulizer solution Take 2 mLs (0.25 mg total) by nebulization 2 (two) times daily. 05/21/22   Ngetich, Dinah C, NP  clopidogrel (PLAVIX) 75 MG tablet Take 1 tablet (75 mg total) by mouth daily. 09/02/22   Ngetich, Dinah C, NP  Continuous Glucose Sensor (FREESTYLE LIBRE 14 DAY SENSOR) MISC 1 each by Does not apply route as needed. DX: E11.69 08/06/22   Sharon Seller, NP  cyclobenzaprine (FLEXERIL) 5 MG tablet Take 1 tablet (5 mg total) by mouth 3 (three) times daily as needed for muscle spasms. 07/10/22   Sherryll Burger, Pratik D, DO  diclofenac Sodium (VOLTAREN) 1 % GEL Apply 4 g topically 4 (four) times daily. 06/27/22   Ngetich, Dinah C, NP  escitalopram (LEXAPRO) 20 MG tablet Take 1 tablet (20 mg total) by mouth every morning. 09/02/22   Ngetich, Dinah C, NP  ezetimibe (ZETIA) 10 MG tablet Take 1 tablet (10 mg total) by mouth daily. 09/02/22   Ngetich, Dinah C, NP  gabapentin (NEURONTIN) 100 MG capsule Take 1 capsule (100 mg total) by mouth 3 (three) times daily. 09/02/22  Ngetich, Dinah C, NP  insulin lispro (HUMALOG) 100 UNIT/ML KwikPen Inject 15 Units into the skin 3 (three) times daily with meals. For blood sugars > 150 09/17/22   Ngetich, Dinah C, NP  Insulin Pen Needle 32G X 4 MM MISC Use to inject insulin 4 times daily as directed. 05/21/22   Ngetich, Donalee Citrin, NP  KLAYESTA powder Apply 1 Application topically 2 (two) times daily. Affected areas on breast fold, groin, and sacral areas. 05/16/22   [provider]  LANTUS SOLOSTAR 100 UNIT/ML Solostar Pen INJECT 40 UNITS SUBCUTANEOUSLY AT BEDTIME Patient taking differently: Inject 40 Units into the skin at bedtime. 08/21/22   Ngetich, Dinah C, NP   LORazepam (ATIVAN) 0.5 MG tablet Take 1 tablet (0.5 mg total) by mouth at bedtime. 08/27/22   Ngetich, Dinah C, NP  methimazole (TAPAZOLE) 5 MG tablet Take 1 tablet (5 mg total) by mouth every morning. 09/16/22   Ngetich, Dinah C, NP  omeprazole (PRILOSEC) 20 MG capsule Take 1 capsule (20 mg total) by mouth daily. 09/02/22   Ngetich, Dinah C, NP  polyethylene glycol powder (GLYCOLAX/MIRALAX) 17 GM/SCOOP powder Take 17 g by mouth daily. Hold for loose stool 06/27/22   Ngetich, Dinah C, NP  ranolazine (RANEXA) 500 MG 12 hr tablet Take 1 tablet (500 mg total) by mouth 2 (two) times daily. 10/09/22 01/07/23  [place], Alvira Philips, DO  rivaroxaban (XARELTO) 10 MG TABS tablet Take 1 tablet (10 mg total) by mouth daily. 09/02/22   Ngetich, Dinah C, NP  tamsulosin (FLOMAX) 0.4 MG CAPS capsule Take 1 capsule (0.4 mg total) by mouth at bedtime. 09/02/22   Ngetich, Dinah C, NP  topiramate (TOPAMAX) 25 MG tablet Take 1 tablet (25 mg total) by mouth 2 (two) times daily. 07/10/22   Sherryll Burger, Pratik D, DO  zinc oxide (BALMEX) 11.3 % CREA cream Apply 1 Application topically 2 (two) times daily. 06/27/22   Ngetich, Dinah C, NP      Allergies    Contrast media [iodinated contrast media], Penicillins, Sumatriptan, Vancomycin, Aspirin, Ciprofloxacin, and Morphine    Review of Systems   Review of Systems  Gastrointestinal:  Positive for vomiting.    Physical Exam Updated Vital Signs BP 104/74   Pulse 98   Temp 98 F (36.7 C) (Oral)   Resp 18   Ht 1.575 m (5\' 2" )   Wt 88.5 kg   SpO2 100%   BMI 35.67 kg/m  Physical Exam Vitals and nursing note reviewed.  Constitutional:      Appearance: She is well-developed. She is not diaphoretic.  HENT:     Head: Normocephalic and atraumatic.     Right Ear: External ear normal.     Left Ear: External ear normal.  Eyes:     General: No scleral icterus.       Right eye: No discharge.        Left eye: No discharge.     Conjunctiva/sclera: Conjunctivae normal.  Neck:     Trachea:  No tracheal deviation.  Cardiovascular:     Rate and Rhythm: Normal rate and regular rhythm.  Pulmonary:     Effort: Pulmonary effort is normal. No respiratory distress.     Breath sounds: Normal breath sounds. No stridor. No wheezing or rales.  Abdominal:     General: Bowel sounds are normal. There is no distension.     Palpations: Abdomen is soft.     Tenderness: There is no abdominal tenderness. There is no guarding or rebound.  Musculoskeletal:        General: No tenderness or deformity.     Cervical back: Neck supple.  Skin:    General: Skin is warm and dry.     Findings: No rash.  Neurological:     Mental Status: She is alert. Mental status is at baseline.     Cranial Nerves: No cranial nerve deficit, dysarthria or facial asymmetry.     Sensory: No sensory deficit.     Motor: No abnormal muscle tone or seizure activity.     Coordination: Coordination normal.     Comments: Left-sided hemiparesis  Psychiatric:        Mood and Affect: Mood normal.     ED Results / Procedures / Treatments   Labs (all labs ordered are listed, but only abnormal results are displayed) Labs Reviewed  COMPREHENSIVE METABOLIC PANEL - Abnormal; Notable for the following components:      Result Value   Glucose, Bld 248 (*)    BUN 39 (*)    Creatinine, Ser 1.73 (*)    Calcium 8.8 (*)    GFR, Estimated 36 (*)    All other components within normal limits  CBC WITH DIFFERENTIAL/PLATELET - Abnormal; Notable for the following components:   RBC 5.17 (*)    MCV 79.5 (*)    MCH 25.9 (*)    RDW 17.5 (*)    All other components within normal limits  BETA-HYDROXYBUTYRIC ACID - Abnormal; Notable for the following components:   Beta-Hydroxybutyric Acid 0.29 (*)    All other components within normal limits  CBG MONITORING, ED - Abnormal; Notable for the following components:   Glucose-Capillary 243 (*)    All other components within normal limits  LIPASE, BLOOD  BLOOD GAS, VENOUS  URINALYSIS, ROUTINE  W REFLEX MICROSCOPIC    EKG EKG Interpretation Date/Time:  Thursday October 31 2022 11:30:42 EDT Ventricular Rate:  102 PR Interval:  167 QRS Duration:  85 QT Interval:  407 QTC Calculation: 531 R Axis:   48  Text Interpretation: Sinus tachycardia Borderline low voltage, extremity leads Prolonged QT interval No significant change since last tracing Confirmed by Linwood Dibbles [phone]) on 10/31/2022 2:06:32 PM  Radiology DG Abd Acute W/Chest  Result Date: 10/31/2022 CLINICAL DATA:  vomiting EXAM: DG ABDOMEN ACUTE WITH 1 VIEW CHEST COMPARISON:  10/13/2022. FINDINGS: The bowel gas pattern is non-obstructive. No evidence of pneumoperitoneum. Bilateral lung fields are clear. Note is made of elevated right hemidiaphragm. Bilateral costophrenic angles are clear. Normal cardio-mediastinal silhouette. There are surgical staples along the heart border and sternotomy wires, status post CABG (coronary artery bypass graft). No acute osseous abnormalities. The soft tissues are within normal limits. Surgical changes, devices, tubes and lines: None. IMPRESSION: *Non-obstructive bowel gas pattern. *No acute cardiopulmonary abnormality. Electronically Signed   By: Jules Schick M.D.   On: 10/31/2022 14:45    Procedures Procedures    Medications Ordered in ED Medications  sodium chloride 0.9 % bolus 1,000 mL (0 mLs Intravenous Stopped 10/31/22 1556)    And  0.9 %  sodium chloride infusion ( Intravenous New Bag/Given 10/31/22 1606)  ondansetron (ZOFRAN) injection 4 mg (4 mg Intravenous Given 10/31/22 1317)  fentaNYL (SUBLIMAZE) injection 50 mcg (50 mcg Intravenous Given 10/31/22 1607)    ED Course/ Medical Decision Making/ A&P Clinical Course as of 10/31/22 1613  Thu Oct 31, 2022  1405 Beta-Hydroxybutyric Acid(!): 0.29 Increased since last [JK]  1405 Blood gas, venous (at [facility] and [facility]) nl [JK]  1405  CBC with Diff(!) nl [JK]  1435 Comprehensive metabolic panel(!) [place] rate increased compared to last  [JK]  1436 Bicarbonate level normal.  Venous blood gas without signs of acidosis doubt DKA [JK]  1524 Patient complaining of abdominal pain.  Pain meds ordered. [JK]    Clinical Course User Index [JK] Linwood Dibbles, MD                                 Medical Decision Making Amount and/or Complexity of Data Reviewed Labs: ordered. Decision-making details documented in ED Course. Radiology: ordered.  Risk Prescription drug management.   Patient presented to ED for evaluation of abdominal pain nausea and vomiting.  Patient's initial laboratory test reassuring.  Normal metabolic panel.  No elevation in her LFTs.  Normal light pace.  Findings not suggestive of pancreatitis or hepatitis.  Consider the possibility of DKA with her diabetes.  No evidence of acidosis.  Slightly elevated beta hydroxy butyric acid but no anion gap but normal pH.  With her abdominal pain and vomiting consider the possibility of bowel obstruction colitis diverticulitis.  CT scan has been ordered.  Care turned over Dr. Theresia Lo at shift change.        Final Clinical Impression(s) / ED Diagnoses Final diagnoses:  None    Rx / DC Orders ED Discharge Orders     None         Linwood Dibbles, MD 10/31/22 [phone]

## 2022-10-31 NOTE — ED Provider Notes (Signed)
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  She was initially evaluated by Dr. Benedict Needy and signed out to me at 1530 pending CT read and reassessment.  Patient was initially tachycardic on arrival and otherwise hemodynamically stable.  Complaining of some lower abdominal tenderness.  The patient had mild hyperglycemia without evidence of DKA.  She has a mild increase of her creatinine from baseline.  She is receiving a total of 1500 cc of fluids so far.  The patient's CT showed no acute abnormality to explain her symptoms.  Patient's abdomen is soft and minimally tender in the lower quadrant.  She reports she has been constipated the last 2 days which may be contributing to her symptoms.  She will be given Bentyl and Tylenol.  The patient will be stable for discharge home after completion of her fluids.  She was recommended close primary care follow-up for creatinine recheck and was given strict return precautions.   Rexford Maus, DO 10/31/22 [phone]

## 2022-11-01 ENCOUNTER — Ambulatory Visit (INDEPENDENT_AMBULATORY_CARE_PROVIDER_SITE_OTHER): Payer: 59 | Admitting: Family

## 2022-11-01 ENCOUNTER — Encounter: Payer: Self-pay | Admitting: Family

## 2022-11-01 VITALS — BP 130/70 | HR 106 | Temp 98.1°F | Resp 16 | Ht 62.0 in | Wt 200.0 lb

## 2022-11-01 DIAGNOSIS — Z8673 Personal history of transient ischemic attack (TIA), and cerebral infarction without residual deficits: Secondary | ICD-10-CM | POA: Diagnosis not present

## 2022-11-01 DIAGNOSIS — R2681 Unsteadiness on feet: Secondary | ICD-10-CM

## 2022-11-01 DIAGNOSIS — I10 Essential (primary) hypertension: Secondary | ICD-10-CM

## 2022-11-01 DIAGNOSIS — E785 Hyperlipidemia, unspecified: Secondary | ICD-10-CM

## 2022-11-01 DIAGNOSIS — E1169 Type 2 diabetes mellitus with other specified complication: Secondary | ICD-10-CM | POA: Diagnosis not present

## 2022-11-01 MED ORDER — OZEMPIC (0.25 OR 0.5 MG/DOSE) 2 MG/1.5ML ~~LOC~~ SOPN: 0.5000 mg | PEN_INJECTOR | SUBCUTANEOUS | 3 refills | Status: DC

## 2022-11-01 MED ORDER — FREESTYLE LIBRE 2 READER DEVI: 1.0000 | Freq: Every day | 11 refills | Status: DC

## 2022-11-01 MED ORDER — INSULIN ASPART 100 UNIT/ML IJ SOLN: 15.0000 [IU] | Freq: Three times a day (TID) | INTRAMUSCULAR | 5 refills | Status: DC

## 2022-11-01 NOTE — Progress Notes (Signed)
Provider: Richarda Blade FNP-C  Lori [name], Lori Citrin, NP  Patient Care Team: Lori [name], Lori Citrin, NP as PCP - General (Family Medicine) Lori Pyo, MD as PCP - Cardiology (Cardiology) [facility]  Extended Emergency Contact Information Primary Emergency Contact: Lori [name] Mobile Phone: [phone] Relation: Spouse Preferred language: English Interpreter needed? No Secondary Emergency Contact: Lori [name] Home Phone: [phone] Mobile Phone: [phone] Relation: Daughter Mother: Lori [name] Phone: [phone] Mobile Phone: [phone]  Code Status:  DNR Goals of care: Advanced Directive information    11/01/2022    3:05 PM  Advanced Directives  Does Patient Have a Medical Advance Directive? No     Chief Complaint  Patient presents with   Follow-up    HPI:  Pt is a 52 y.o. female seen today for ED follow up.she is here with spouse.states went to ED for nausea and vomiting.CT of the abdomen done showed no acute abnormalities.abdomen exam was negative.she was treated with Bentyl and tylenol.Her BUN was 39 and CR 1.73  she was treated with IVF received 1500 cc of Fluids.her condition improved and was discharged home.   She request refill on Ozempic though both states did not know how to dial to the right amount so did not use previous prescribed medication.   Also request orders for [facility] PT/OT states has not been getting up to use her walker.No fall episode reported.    Past Medical History:  Diagnosis Date   CAD (coronary artery disease)    Depression    Diabetes mellitus without complication (HCC)    History of CT scan    History of mammogram    History of MRI    Hypertension    Pheochromocytoma    Stroke (HCC)    Thyroid disease    Past Surgical History:  Procedure Laterality Date   ABDOMINAL HYSTERECTOMY     CESAREAN SECTION     3   CORONARY ARTERY BYPASS GRAFT     Right adrenal gland removal for  pheochromocytoma Right     Allergies  Allergen Reactions   Contrast Media [Iodinated Contrast Media] Anaphylaxis and Swelling   Penicillins Swelling    Mouth swells up and eyes swollen shut   Sumatriptan Anaphylaxis   Vancomycin Other (See Comments)    Red Man's Syndrome   Aspirin Swelling   Ciprofloxacin     Feel nausea/vomitting   Morphine Swelling    Outpatient Encounter Medications as of 11/01/2022  Medication Sig   albuterol (PROVENTIL) (2.5 MG/3ML) 0.083% nebulizer solution Take 3 mLs (2.5 mg total) by nebulization every 6 (six) hours as needed for wheezing or shortness of breath.   albuterol (VENTOLIN HFA) 108 (90 Base) MCG/ACT inhaler INHALE 2 PUFFS BY MOUTH EVERY 6 HOURS AS NEEDED FOR WHEEZING AND FOR SHORTNESS OF BREATH   atorvastatin (LIPITOR) 80 MG tablet Take 1 tablet (80 mg total) by mouth daily.   baclofen (LIORESAL) 10 MG tablet Take 1 tablet (10 mg total) by mouth 3 (three) times daily. X 1 week, then 10 mg TID-for spasticity- can increase  the dose if you call me to discuss   blood glucose meter kit and supplies 1 each by Other route as directed. Dispense based on patient and insurance preference. Use up to four times daily as directed. (FOR ICD-10 E10.9, E11.9).   budesonide (PULMICORT) 0.25 MG/2ML nebulizer solution Take 2 mLs (0.25 mg total) by nebulization 2 (two) times daily.   clopidogrel (PLAVIX) 75 MG tablet Take 1 tablet (75 mg total) by  mouth daily.   Continuous Glucose Sensor (FREESTYLE LIBRE 14 DAY SENSOR) MISC 1 each by Does not apply route as needed. DX: E11.69   cyclobenzaprine (FLEXERIL) 5 MG tablet Take 1 tablet (5 mg total) by mouth 3 (three) times daily as needed for muscle spasms.   diclofenac Sodium (VOLTAREN) 1 % GEL Apply 4 g topically 4 (four) times daily.   dicyclomine (BENTYL) 20 MG tablet Take 1 tablet (20 mg total) by mouth 2 (two) times daily.   escitalopram (LEXAPRO) 20 MG tablet Take 1 tablet (20 mg total) by mouth every morning.    ezetimibe (ZETIA) 10 MG tablet Take 1 tablet (10 mg total) by mouth daily.   gabapentin (NEURONTIN) 100 MG capsule Take 1 capsule (100 mg total) by mouth 3 (three) times daily.   insulin lispro (HUMALOG) 100 UNIT/ML KwikPen Inject 15 Units into the skin 3 (three) times daily with meals. For blood sugars > 150   Insulin Pen Needle 32G X 4 MM MISC Use to inject insulin 4 times daily as directed.   KLAYESTA powder Apply 1 Application topically 2 (two) times daily. Affected areas on breast fold, groin, and sacral areas.   LANTUS SOLOSTAR 100 UNIT/ML Solostar Pen INJECT 40 UNITS SUBCUTANEOUSLY AT BEDTIME (Patient taking differently: Inject 40 Units into the skin at bedtime.)   LORazepam (ATIVAN) 0.5 MG tablet Take 1 tablet (0.5 mg total) by mouth at bedtime.   methimazole (TAPAZOLE) 5 MG tablet Take 1 tablet (5 mg total) by mouth every morning.   omeprazole (PRILOSEC) 20 MG capsule Take 1 capsule (20 mg total) by mouth daily.   ondansetron (ZOFRAN) 4 MG tablet Take 1 tablet (4 mg total) by mouth every 6 (six) hours.   polyethylene glycol powder (GLYCOLAX/MIRALAX) 17 GM/SCOOP powder Take 17 g by mouth daily. Hold for loose stool   ranolazine (RANEXA) 500 MG 12 hr tablet Take 1 tablet (500 mg total) by mouth 2 (two) times daily.   rivaroxaban (XARELTO) 10 MG TABS tablet Take 1 tablet (10 mg total) by mouth daily.   senna-docusate (SENOKOT-S) 8.6-50 MG tablet Take 1 tablet by mouth daily.   tamsulosin (FLOMAX) 0.4 MG CAPS capsule Take 1 capsule (0.4 mg total) by mouth at bedtime.   topiramate (TOPAMAX) 25 MG tablet Take 1 tablet (25 mg total) by mouth 2 (two) times daily.   zinc oxide (BALMEX) 11.3 % CREA cream Apply 1 Application topically 2 (two) times daily.   No facility-administered encounter medications on file as of 11/01/2022.    Review of Systems  Constitutional:  Negative for appetite change, chills, fatigue, fever and unexpected weight change.  HENT:  Negative for congestion, dental problem,  ear discharge, ear pain, facial swelling, hearing loss, nosebleeds, postnasal drip, rhinorrhea, sinus pressure, sinus pain, sneezing, sore throat, tinnitus and trouble swallowing.   Eyes:  Negative for pain, discharge, redness, itching and visual disturbance.  Respiratory:  Negative for cough, chest tightness, shortness of breath and wheezing.   Cardiovascular:  Negative for chest pain, palpitations and leg swelling.  Gastrointestinal:  Negative for abdominal distention, abdominal pain, blood in stool, constipation, diarrhea, nausea and vomiting.  Endocrine: Negative for cold intolerance, heat intolerance, polydipsia, polyphagia and polyuria.  Genitourinary:  Negative for difficulty urinating, dysuria, flank pain, frequency and urgency.  Musculoskeletal:  Positive for gait problem. Negative for arthralgias, back pain, joint swelling, myalgias, neck pain and neck stiffness.  Skin:  Negative for color change, pallor, rash and wound.  Neurological:  Positive for weakness. Negative  for dizziness, syncope, speech difficulty, light-headedness, numbness and headaches.       Left sided weakness   Hematological:  Does not bruise/bleed easily.  Psychiatric/Behavioral:  Negative for agitation, behavioral problems, confusion, hallucinations, self-injury, sleep disturbance and suicidal ideas. The patient is not nervous/anxious.     Immunization History  Administered Date(s) Administered   Influenza-Unspecified 11/04/2016   PPD Test 03/19/2016   Tdap 02/07/2014   Pertinent  Health Maintenance Due  Topic Date Due   FOOT EXAM  Never done   OPHTHALMOLOGY EXAM  Never done   Colonoscopy  Never done   INFLUENZA VACCINE  09/05/2022   MAMMOGRAM  Never done   HEMOGLOBIN A1C  02/27/2023      05/29/2022   11:04 AM 09/10/2022   12:28 PM 09/30/2022    2:16 PM 10/28/2022    1:49 PM 11/01/2022    3:05 PM  Fall Risk  Falls in the past year? 0 0 0 1 0  Was there an injury with Fall? 0 0 0 1   Fall Risk Category  Calculator 0 0 0 3   Patient at Risk for Falls Due to History of fall(s)  No Fall Risks History of fall(s)   Fall risk Follow up Falls evaluation completed  Falls evaluation completed Falls evaluation completed    Functional Status Survey:    Vitals:   11/01/22 1513  BP: 130/70  Pulse: (!) 106  Resp: 16  Temp: 98.1 F (36.7 C)  SpO2: 97%  Weight: 200 lb (90.7 kg)  Height: 5\' 2"  (1.575 m)   Body mass index is 36.58 kg/m. Physical Exam Vitals reviewed.  Constitutional:      General: She is not in acute distress.    Appearance: Normal appearance. She is obese. She is not ill-appearing or diaphoretic.  HENT:     Head: Normocephalic.     Right Ear: Tympanic membrane, ear canal and external ear normal. There is no impacted cerumen.     Left Ear: Tympanic membrane, ear canal and external ear normal. There is no impacted cerumen.     Nose: Nose normal. No congestion or rhinorrhea.     Mouth/Throat:     Mouth: Mucous membranes are moist.     Pharynx: Oropharynx is clear. No oropharyngeal exudate or posterior oropharyngeal erythema.  Eyes:     General: No scleral icterus.       Right eye: No discharge.        Left eye: No discharge.     Extraocular Movements: Extraocular movements intact.     Conjunctiva/sclera: Conjunctivae normal.     Pupils: Pupils are equal, round, and reactive to light.  Neck:     Vascular: No carotid bruit.  Cardiovascular:     Rate and Rhythm: Normal rate and regular rhythm.     Pulses: Normal pulses.     Heart sounds: Normal heart sounds. No murmur heard.    No friction rub. No gallop.  Pulmonary:     Effort: Pulmonary effort is normal. No respiratory distress.     Breath sounds: Normal breath sounds. No wheezing, rhonchi or rales.  Chest:     Chest wall: No tenderness.  Abdominal:     General: Bowel sounds are normal. There is no distension.     Palpations: Abdomen is soft. There is no mass.     Tenderness: There is no abdominal tenderness. There  is no right CVA tenderness, left CVA tenderness, guarding or rebound.  Musculoskeletal:  General: No swelling or tenderness. Normal range of motion.     Cervical back: Normal range of motion. No rigidity or tenderness.     Right lower leg: No edema.     Left lower leg: No edema.  Lymphadenopathy:     Cervical: No cervical adenopathy.  Skin:    General: Skin is warm and dry.     Coloration: Skin is not pale.     Findings: No bruising, erythema, lesion or rash.  Neurological:     Mental Status: She is alert and oriented to person, place, and time.     Cranial Nerves: No cranial nerve deficit.     Sensory: No sensory deficit.     Motor: Weakness present.     Coordination: Coordination normal.     Gait: Gait abnormal.     Comments: Left hand contracture   Psychiatric:        Mood and Affect: Mood normal.        Speech: Speech normal.        Behavior: Behavior normal.        Thought Content: Thought content normal.        Judgment: Judgment normal.     Labs reviewed: Recent Labs    11/05/21 0259 11/06/21 0307 10/12/22 0714 10/13/22 0325 10/14/22 0333 10/15/22 0330 10/31/22 1315  NA 138   < > 135 134* 133* 133* 135  K 3.3*   < > 3.7 3.6 3.5 3.8 4.3  CL 112*   < > 99 102 103 104 99  CO2 20*   < > 23 23 21* 19* 23  GLUCOSE 113*   < > 183* 102* 190* 244* 248*  BUN 20   < > 24* 17 15 13  39*  CREATININE 0.69   < > 0.91 0.81 0.78 0.84 1.73*  CALCIUM 8.3*   < > 8.9 8.3* 8.0* 8.3* 8.8*  MG 2.1  --  1.8 2.1  --   --   --   PHOS  --   --  3.2 3.9  --   --   --    < > = values in this interval not displayed.   Recent Labs    10/14/22 0333 10/15/22 0330 10/31/22 1315  AST 9* 11* 16  ALT 12 13 21   ALKPHOS 55 60 90  BILITOT 0.8 0.4 0.5  PROT 5.7* 6.0* 7.7  ALBUMIN 2.6* 2.6* 3.5   Recent Labs    10/13/22 0325 10/14/22 0333 10/15/22 0330 10/31/22 1315  WBC 12.2* 11.8* 9.5 8.9  NEUTROABS 7.5  --  6.2 6.4  HGB 10.2* 9.6* 10.1* 13.4  HCT 31.9* 29.7* 30.4* 41.1   MCV 79.6* 78.6* 77.2* 79.5*  PLT 300 298 222 382   Lab Results  Component Value Date   TSH 0.816 10/06/2022   Lab Results  Component Value Date   HGBA1C 9.8 (H) 08/27/2022   Lab Results  Component Value Date   CHOL 132 10/07/2022   HDL 35 (L) 10/07/2022   LDLCALC 66 10/07/2022   TRIG 157 (H) 10/07/2022   CHOLHDL 3.8 10/07/2022    Significant Diagnostic Results in last 30 days:  CT ABDOMEN PELVIS WO CONTRAST  Result Date: 10/31/2022 CLINICAL DATA:  Abdominal pain, vomiting, nausea EXAM: CT ABDOMEN AND PELVIS WITHOUT CONTRAST TECHNIQUE: Multidetector CT imaging of the abdomen and pelvis was performed following the standard protocol without IV contrast. RADIATION DOSE REDUCTION: This exam was performed according to the departmental dose-optimization program which includes automated exposure  control, adjustment of the mA and/or kV according to patient size and/or use of iterative reconstruction technique. COMPARISON:  Radiographs 10/30/2022 and CT scan 10/11/2022 FINDINGS: Lower chest: Prior CABG. Coronary and aortic atheromatous vascular calcification. Small type 1 hiatal hernia. There is a band of atelectasis or scarring in the right lower lobe stable from 10/11/2022. Hepatobiliary: Contracted gallbladder.  Otherwise unremarkable. Pancreas: The peripancreatic stranding shown on the prior exam has essentially resolved. No significant peripancreatic fluid collection. Speckled calcifications along the pancreatic head may be from chronic calcific pancreatitis, less likely vascular calcifications. Spleen: Unremarkable Adrenals/Urinary Tract: Prior right adrenalectomy. Left adrenal gland normal. Non rotated right kidney. Urinary bladder unremarkable. Stomach/Bowel: Suspected periampullary duodenal diverticulum. Normal appendix. Otherwise unremarkable. Vascular/Lymphatic: Atherosclerosis is present, including aortoiliac atherosclerotic disease. Atheromatous vascular calcifications noted in the celiac  trunk and proximal SMA, today's noncontrast exam does not assess patency. No pathologic adenopathy. Reproductive: Uterus absent.  Adnexa unremarkable. Other: No supplemental non-categorized findings. Musculoskeletal: As on prior exams there is a 3.9 by 1.7 cm soft tissue density along the anterior margin lower linea alba (image 82, series 2) the could be a fibroma/nodular scar or less likely ectopic focus of endometrial tissue. The irregular margins favor the former. Moderate degenerative chondral thinning in both hips. IMPRESSION: 1. No specific acute findings are identified to explain the patient's abdominal pain. 2. The peripancreatic stranding shown on the prior exam has essentially resolved. Speckled calcifications along the pancreatic head may be from chronic calcific pancreatitis, less likely vascular calcifications. 3. Aortic atherosclerosis. 4. Small type 1 hiatal hernia. 5. Moderate degenerative chondral thinning in both hips. 6. Stable soft tissue density along the anterior margin of the lower linea alba, favoring a fibroma/nodular scar or less likely ectopic focus of endometrial tissue. Chronically stable. Aortic Atherosclerosis (ICD10-I70.0). Electronically Signed   By: Gaylyn Rong M.D.   On: 10/31/2022 17:33   DG Abd Acute W/Chest  Result Date: 10/31/2022 CLINICAL DATA:  vomiting EXAM: DG ABDOMEN ACUTE WITH 1 VIEW CHEST COMPARISON:  10/13/2022. FINDINGS: The bowel gas pattern is non-obstructive. No evidence of pneumoperitoneum. Bilateral lung fields are clear. Note is made of elevated right hemidiaphragm. Bilateral costophrenic angles are clear. Normal cardio-mediastinal silhouette. There are surgical staples along the heart border and sternotomy wires, status post CABG (coronary artery bypass graft). No acute osseous abnormalities. The soft tissues are within normal limits. Surgical changes, devices, tubes and lines: None. IMPRESSION: *Non-obstructive bowel gas pattern. *No acute  cardiopulmonary abnormality. Electronically Signed   By: Jules Schick M.D.   On: 10/31/2022 14:45   DG Abd 1 View  Result Date: 10/15/2022 CLINICAL DATA:  Constipation. EXAM: ABDOMEN - 1 VIEW COMPARISON:  CT abdomen and pelvis 10/11/2022 FINDINGS: A small to moderate amount of stool is present throughout the colon and rectum. No dilated loops of bowel are seen to suggest obstruction. Right upper quadrant abdominal surgical clips and sternal wires are noted. No acute osseous abnormality is seen. IMPRESSION: Small-to-moderate stool burden.  No evidence of bowel obstruction. Electronically Signed   By: Sebastian Ache M.D.   On: 10/15/2022 14:34   DG CHEST PORT 1 VIEW  Result Date: 10/13/2022 CLINICAL DATA:  Shortness of breath.  Possible aspiration. EXAM: PORTABLE CHEST 1 VIEW COMPARISON:  10/11/2022 FINDINGS: The heart size and mediastinal contours are within normal limits. Prior median sternotomy. Low lung volumes noted. Both lungs are clear. The visualized skeletal structures are unremarkable. IMPRESSION: Low lung volumes. No active disease. Electronically Signed   By: Danae Orleans  M.D.   On: 10/13/2022 10:20   CT ABDOMEN PELVIS WO CONTRAST  Result Date: 10/11/2022 CLINICAL DATA:  Nonbilious vomiting. 52 year old female. Concern for aspiration. EXAM: CT ABDOMEN AND PELVIS WITHOUT CONTRAST TECHNIQUE: Multidetector CT imaging of the abdomen and pelvis was performed following the standard protocol without IV contrast. RADIATION DOSE REDUCTION: This exam was performed according to the departmental dose-optimization program which includes automated exposure control, adjustment of the mA and/or kV according to patient size and/or use of iterative reconstruction technique. COMPARISON:  CTs without contrast dated 07/04/2022 and 07/10/2021 FINDINGS: Lower chest: Sternotomy and old CABG changes with heavy native CAD particularly for age. There are calcifications in the aortic valve plane. The cardiac size is normal.  There is no pericardial effusion. Small hiatal hernia. Linear scarring or atelectasis again seen in the lower lobes, right middle lobe but no acute lung base process. Hepatobiliary: No focal liver abnormality is seen without contrast. The gallbladder and bile ducts are unremarkable. Pancreas: Interval new finding of fatty stranding around the entire pancreas, development of mild generalized pancreatic edema. Findings consistent with acute interstitial pancreatitis. No pancreatic ductal dilatation is seen. No peripancreatic hemorrhage or abscess is seen. There is trace peripancreatic reactive fluid tracking slightly inferiorly. No pancreatic mass is seen without contrast. Spleen: No abnormality. Adrenals/Urinary Tract: Surgical clips from prior right adrenalectomy. Unremarkable left adrenal gland. There is no contour deforming abnormality of either kidney. Congenital malrotation of the right kidney. There is no evidence of urinary stones or obstruction. Unremarkable bladder. Stomach/Bowel: The stomach is contracted. There is a 2.8 cm diverticulum off the medial aspect of the second portion, containing debris and trace fluid. Rest of the small bowel is unremarkable and normal caliber. The appendix is normal and well seen. Mild-to-moderate fecal stasis ascending and transverse colon, left colonic diverticula without evidence of diverticulitis again noted. Vascular/Lymphatic: Extensive aortic and iliac/branch vessel atherosclerosis particularly for a 52 year old. No AAA. No lymphadenopathy is seen. Reproductive: Status post hysterectomy. No adnexal masses. Other: Umbilical rectus diastasis and outward protrusion is again noted with several small right of the midline fat hernias and a small midline umbilical fat hernia. There is a small right inguinal fat hernia. Small caliber small bowel segments extend into the area of midline outward protrusion at the umbilicus but there is no incarcerated hernia. There has no pelvic  ascites. No free hemorrhage, free air or abscess. Musculoskeletal: Possibly associated with the patient's C-section scar, in the deep subcutaneous plane in the midline abutting the low anterior pelvic wall there is an old irregular masslike density measuring 3.5 x 2 cm on 2:91, unchanged. This could represent a fibroma, dense scar related to old hematoma, or an extra-abdominal endometrioma. In any case it is entirely stable. There is slight lumbar levoscoliosis, mild osteopenia. Mild degenerative change lumbar spine. IMPRESSION: 1. Acute interstitial pancreatitis without evidence of hemorrhage or abscess. Minimal nonlocalizing reactive fluid. 2. Constipation and diverticulosis. 3. Age advanced atherosclerosis.  Prior CABG. 4. Umbilical rectus diastasis and outward protrusion with several small right of the midline fat hernias and a small midline umbilical fat hernia. Small caliber small bowel segments extend into the area of midline outward protrusion at the umbilicus but there is no incarcerated hernia or SBO. 5. Small hiatal hernia. 6. Stable 3.5 x 2 cm irregular masslike density in the deep subcutaneous plane abutting the low anterior pelvic wall. This could represent a fibroma, dense scar such as related to old hematoma, or an extra-abdominal endometrioma. In any case it is  entirely unchanged. Aortic Atherosclerosis (ICD10-I70.0). Electronically Signed   By: Almira Bar M.D.   On: 10/11/2022 21:51   DG Chest Portable 1 View  Result Date: 10/11/2022 CLINICAL DATA:  Vomiting with possible aspiration. EXAM: PORTABLE CHEST 1 VIEW COMPARISON:  October 06, 2022 FINDINGS: Limited study secondary to patient rotation. Multiple sternal wires and vascular clips are seen. The heart size and mediastinal contours are within normal limits. Low lung volumes are present without evidence of an acute infiltrate, pleural effusion or pneumothorax. The visualized skeletal structures are unremarkable. IMPRESSION: 1. Evidence of  prior median sternotomy/CABG. 2. Low lung volumes without acute or active cardiopulmonary disease. Electronically Signed   By: Aram Candela M.D.   On: 10/11/2022 20:57   ECHOCARDIOGRAM COMPLETE  Result Date: 10/07/2022    ECHOCARDIOGRAM REPORT   Patient Name:   ZAILY WRITT Date of Exam: 10/07/2022 Medical Rec #:  [id number]           Height:       62.0 in Accession #:    [id number]          Weight:       201.9 lb Date of Birth:  [birth year]            BSA:          1.920 m Patient Age:    51 years            BP:           84/63 mmHg Patient Gender: F                   HR:           86 bpm. Exam Location:  Inpatient Procedure: 2D Echo, Color Doppler, Cardiac Doppler and Intracardiac            Opacification Agent Indications:    Chest Pain  History:        Patient has prior history of Echocardiogram examinations, most                 recent 01/21/2021. CAD, Stroke, Signs/Symptoms:Chest Pain; Risk                 Factors:Diabetes, Hypertension and Dyslipidemia.  Sonographer:    Milbert Coulter Referring Phys: [id number] SUBRINA SUNDIL  Sonographer Comments: Technically difficult study due to poor echo windows, suboptimal apical window, suboptimal subcostal window and patient is obese. Image acquisition challenging due to patient body habitus and patient could not tollerate much pressure in the apical windows due to broken skin and rash. IMPRESSIONS  1. Left ventricular ejection fraction, by estimation, is 60 to 65%. The left ventricle has normal function. The left ventricle has no regional wall motion abnormalities. There is mild concentric left ventricular hypertrophy. Left ventricular diastolic parameters were normal.  2. Right ventricular systolic function is normal. The right ventricular size is normal. There is normal pulmonary artery systolic pressure. The estimated right ventricular systolic pressure is 21.0 mmHg.  3. The mitral valve is grossly normal. Trivial mitral valve regurgitation.  4. The aortic  valve is tricuspid. There is mild calcification of the aortic valve. Aortic valve regurgitation is not visualized. Aortic valve sclerosis/calcification is present, without any evidence of aortic stenosis. Aortic valve mean gradient measures 4.0 mmHg.  5. The inferior vena cava is normal in size with greater than 50% respiratory variability, suggesting right atrial pressure of 3 mmHg.  6. Cannot exclude a small PFO. Comparison(s): Prior images reviewed side  by side. LVEF 60-65%. Normal estimated RVSP. Cannot exclude small PFO. FINDINGS  Left Ventricle: Left ventricular ejection fraction, by estimation, is 60 to 65%. The left ventricle has normal function. The left ventricle has no regional wall motion abnormalities. Definity contrast agent was given IV to delineate the left ventricular  endocardial borders. The left ventricular internal cavity size was normal in size. There is mild concentric left ventricular hypertrophy. Left ventricular diastolic parameters were normal. Right Ventricle: The right ventricular size is normal. No increase in right ventricular wall thickness. Right ventricular systolic function is normal. There is normal pulmonary artery systolic pressure. The tricuspid regurgitant velocity is 2.12 m/s, and  with an assumed right atrial pressure of 3 mmHg, the estimated right ventricular systolic pressure is 21.0 mmHg. Left Atrium: Left atrial size was normal in size. Right Atrium: Right atrial size was normal in size. Pericardium: Trivial pericardial effusion is present. The pericardial effusion is posterior to the left ventricle. Presence of epicardial fat layer. Mitral Valve: The mitral valve is grossly normal. Trivial mitral valve regurgitation. Tricuspid Valve: The tricuspid valve is grossly normal. Tricuspid valve regurgitation is mild. Aortic Valve: The aortic valve is tricuspid. There is mild calcification of the aortic valve. Aortic valve regurgitation is not visualized. Aortic valve  sclerosis/calcification is present, without any evidence of aortic stenosis. Aortic valve mean gradient measures 4.0 mmHg. Aortic valve peak gradient measures 8.3 mmHg. Aortic valve area, by VTI measures 1.58 cm. Pulmonic Valve: The pulmonic valve was grossly normal. Pulmonic valve regurgitation is trivial. Aorta: The aortic root and ascending aorta are structurally normal, with no evidence of dilitation. Venous: The inferior vena cava is normal in size with greater than 50% respiratory variability, suggesting right atrial pressure of 3 mmHg. IAS/Shunts: Cannot exclude a small PFO.  LEFT VENTRICLE PLAX 2D LVIDd:         3.50 cm   Diastology LVIDs:         2.10 cm   LV e' medial:    6.74 cm/s LV PW:         1.20 cm   LV E/e' medial:  8.9 LV IVS:        1.00 cm   LV e' lateral:   8.27 cm/s LVOT diam:     1.90 cm   LV E/e' lateral: 7.2 LV SV:         40 LV SV Index:   21 LVOT Area:     2.84 cm  LEFT ATRIUM         Index LA diam:    2.90 cm 1.51 cm/m  AORTIC VALVE AV Area (Vmax):    1.51 cm AV Area (Vmean):   1.50 cm AV Area (VTI):     1.58 cm AV Vmax:           144.00 cm/s AV Vmean:          97.000 cm/s AV VTI:            0.253 m AV Peak Grad:      8.3 mmHg AV Mean Grad:      4.0 mmHg LVOT Vmax:         76.50 cm/s LVOT Vmean:        51.400 cm/s LVOT VTI:          0.141 m LVOT/AV VTI ratio: 0.56  AORTA Ao Root diam: 2.90 cm Ao Asc diam:  2.80 cm MITRAL VALVE  TRICUSPID VALVE MV Area (PHT): 3.50 cm    TR Peak grad:   18.0 mmHg MV Decel Time: 217 msec    TR Vmax:        212.00 cm/s MV E velocity: 59.70 cm/s MV A velocity: 58.70 cm/s  SHUNTS MV E/A ratio:  1.02        Systemic VTI:  0.14 m                            Systemic Diam: 1.90 cm Nona Dell MD Electronically signed by Nona Dell MD Signature Date/Time: 10/07/2022/4:47:43 PM    Final    DG Chest 2 View  Result Date: 10/06/2022 CLINICAL DATA:  Left-sided chest pain radiates to the neck. EXAM: CHEST - 2 VIEW COMPARISON:  09/06/2022  FINDINGS: The lungs are clear without focal pneumonia, edema, pneumothorax or pleural effusion. The cardiopericardial silhouette is within normal limits for size. Status post CABG. IMPRESSION: No active cardiopulmonary disease. Electronically Signed   By: Kennith Center M.D.   On: 10/06/2022 17:51    Assessment/Plan 1. Type 2 diabetes mellitus with hyperlipidemia (HCC) Lab Results  Component Value Date   HGBA1C 9.8 (H) 08/27/2022  - start on Ozempic  - continue on Lantus and Novolog  - Notify provider if CBG < 75 or > 200 - Basic metabolic panel - Continuous Glucose Receiver (FREESTYLE LIBRE 2 READER) DEVI; 1 Device by Does not apply route daily. E11.69  Dispense: 1 each; Refill: 11 - Ambulatory referral to Home Health  2. Essential hypertension B/p well controlled  - Ambulatory referral to Home Health  3. 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 Unsteady gait HH PT for gait stability,exercise and muscle strengthen  - Ambulatory referral to Home Health  Family/ staff Communication: Reviewed plan of care with patient and Husband verbalized understanding   Labs/tests ordered: BMP  Next Appointment: Return if symptoms worsen or fail to improve.   Caesar Bookman, NP

## 2022-11-02 LAB — BASIC METABOLIC PANEL
BUN/Creatinine Ratio: 28 (calc) — ABNORMAL HIGH (ref 6–22)
BUN: 28 mg/dL — ABNORMAL HIGH (ref 7–25)
CO2: 24 mmol/L (ref 20–32)
Calcium: 9.9 mg/dL (ref 8.6–10.4)
Chloride: 101 mmol/L (ref 98–110)
Creat: 1.01 mg/dL (ref 0.50–1.03)
Glucose, Bld: 217 mg/dL — ABNORMAL HIGH (ref 65–99)
Potassium: 4.4 mmol/L (ref 3.5–5.3)
Sodium: 138 mmol/L (ref 135–146)

## 2022-11-03 ENCOUNTER — Emergency Department (HOSPITAL_COMMUNITY): Payer: 59

## 2022-11-03 ENCOUNTER — Inpatient Hospital Stay (HOSPITAL_COMMUNITY)
Admission: EM | Admit: 2022-11-03 | Discharge: 2022-11-07 | DRG: 092 | Disposition: A | Discharge status: Home Health | Payer: 59 | Attending: Internal Medicine | Admitting: Internal Medicine

## 2022-11-03 DIAGNOSIS — Z79899 Other long term (current) drug therapy: Secondary | ICD-10-CM

## 2022-11-03 DIAGNOSIS — E0590 Thyrotoxicosis, unspecified without thyrotoxic crisis or storm: Secondary | ICD-10-CM | POA: Diagnosis not present

## 2022-11-03 DIAGNOSIS — Z881 Allergy status to other antibiotic agents status: Secondary | ICD-10-CM

## 2022-11-03 DIAGNOSIS — Z86718 Personal history of other venous thrombosis and embolism: Secondary | ICD-10-CM

## 2022-11-03 DIAGNOSIS — I69354 Hemiplegia and hemiparesis following cerebral infarction affecting left non-dominant side: Secondary | ICD-10-CM

## 2022-11-03 DIAGNOSIS — I6782 Cerebral ischemia: Secondary | ICD-10-CM | POA: Diagnosis not present

## 2022-11-03 DIAGNOSIS — I10 Essential (primary) hypertension: Secondary | ICD-10-CM | POA: Diagnosis present

## 2022-11-03 DIAGNOSIS — E1165 Type 2 diabetes mellitus with hyperglycemia: Secondary | ICD-10-CM | POA: Diagnosis present

## 2022-11-03 DIAGNOSIS — Z8679 Personal history of other diseases of the circulatory system: Secondary | ICD-10-CM | POA: Diagnosis not present

## 2022-11-03 DIAGNOSIS — I2510 Atherosclerotic heart disease of native coronary artery without angina pectoris: Secondary | ICD-10-CM | POA: Diagnosis present

## 2022-11-03 DIAGNOSIS — Z832 Family history of diseases of the blood and blood-forming organs and certain disorders involving the immune mechanism: Secondary | ICD-10-CM

## 2022-11-03 DIAGNOSIS — Z7901 Long term (current) use of anticoagulants: Secondary | ICD-10-CM

## 2022-11-03 DIAGNOSIS — I517 Cardiomegaly: Secondary | ICD-10-CM | POA: Diagnosis not present

## 2022-11-03 DIAGNOSIS — R112 Nausea with vomiting, unspecified: Secondary | ICD-10-CM | POA: Diagnosis present

## 2022-11-03 DIAGNOSIS — R471 Dysarthria and anarthria: Secondary | ICD-10-CM | POA: Diagnosis not present

## 2022-11-03 DIAGNOSIS — E119 Type 2 diabetes mellitus without complications: Secondary | ICD-10-CM | POA: Diagnosis not present

## 2022-11-03 DIAGNOSIS — Z2239 Carrier of other specified bacterial diseases: Secondary | ICD-10-CM

## 2022-11-03 DIAGNOSIS — I959 Hypotension, unspecified: Secondary | ICD-10-CM | POA: Diagnosis present

## 2022-11-03 DIAGNOSIS — T50915A Adverse effect of multiple unspecified drugs, medicaments and biological substances, initial encounter: Secondary | ICD-10-CM | POA: Diagnosis not present

## 2022-11-03 DIAGNOSIS — N179 Acute kidney failure, unspecified: Principal | ICD-10-CM | POA: Diagnosis present

## 2022-11-03 DIAGNOSIS — Z951 Presence of aortocoronary bypass graft: Secondary | ICD-10-CM

## 2022-11-03 DIAGNOSIS — R29810 Facial weakness: Secondary | ICD-10-CM | POA: Diagnosis not present

## 2022-11-03 DIAGNOSIS — R404 Transient alteration of awareness: Secondary | ICD-10-CM | POA: Diagnosis not present

## 2022-11-03 DIAGNOSIS — Z808 Family history of malignant neoplasm of other organs or systems: Secondary | ICD-10-CM

## 2022-11-03 DIAGNOSIS — R040 Epistaxis: Secondary | ICD-10-CM | POA: Diagnosis not present

## 2022-11-03 DIAGNOSIS — E896 Postprocedural adrenocortical (-medullary) hypofunction: Secondary | ICD-10-CM | POA: Diagnosis present

## 2022-11-03 DIAGNOSIS — Z888 Allergy status to other drugs, medicaments and biological substances status: Secondary | ICD-10-CM

## 2022-11-03 DIAGNOSIS — Z6836 Body mass index (BMI) 36.0-36.9, adult: Secondary | ICD-10-CM

## 2022-11-03 DIAGNOSIS — G8194 Hemiplegia, unspecified affecting left nondominant side: Secondary | ICD-10-CM | POA: Diagnosis present

## 2022-11-03 DIAGNOSIS — D638 Anemia in other chronic diseases classified elsewhere: Secondary | ICD-10-CM | POA: Diagnosis present

## 2022-11-03 DIAGNOSIS — Z8673 Personal history of transient ischemic attack (TIA), and cerebral infarction without residual deficits: Secondary | ICD-10-CM

## 2022-11-03 DIAGNOSIS — D72829 Elevated white blood cell count, unspecified: Secondary | ICD-10-CM | POA: Diagnosis not present

## 2022-11-03 DIAGNOSIS — R479 Unspecified speech disturbances: Secondary | ICD-10-CM | POA: Diagnosis not present

## 2022-11-03 DIAGNOSIS — G9340 Encephalopathy, unspecified: Secondary | ICD-10-CM | POA: Diagnosis present

## 2022-11-03 DIAGNOSIS — G9341 Metabolic encephalopathy: Secondary | ICD-10-CM | POA: Diagnosis present

## 2022-11-03 DIAGNOSIS — Z8249 Family history of ischemic heart disease and other diseases of the circulatory system: Secondary | ICD-10-CM

## 2022-11-03 DIAGNOSIS — R32 Unspecified urinary incontinence: Secondary | ICD-10-CM | POA: Diagnosis present

## 2022-11-03 DIAGNOSIS — I639 Cerebral infarction, unspecified: Secondary | ICD-10-CM | POA: Diagnosis not present

## 2022-11-03 DIAGNOSIS — R9431 Abnormal electrocardiogram [ECG] [EKG]: Secondary | ICD-10-CM | POA: Diagnosis present

## 2022-11-03 DIAGNOSIS — Z743 Need for continuous supervision: Secondary | ICD-10-CM | POA: Diagnosis not present

## 2022-11-03 DIAGNOSIS — L304 Erythema intertrigo: Secondary | ICD-10-CM | POA: Diagnosis present

## 2022-11-03 DIAGNOSIS — Z880 Allergy status to penicillin: Secondary | ICD-10-CM

## 2022-11-03 DIAGNOSIS — G8191 Hemiplegia, unspecified affecting right dominant side: Secondary | ICD-10-CM | POA: Diagnosis not present

## 2022-11-03 DIAGNOSIS — Z833 Family history of diabetes mellitus: Secondary | ICD-10-CM

## 2022-11-03 DIAGNOSIS — Z87891 Personal history of nicotine dependence: Secondary | ICD-10-CM

## 2022-11-03 DIAGNOSIS — R739 Hyperglycemia, unspecified: Secondary | ICD-10-CM | POA: Diagnosis not present

## 2022-11-03 DIAGNOSIS — E039 Hypothyroidism, unspecified: Secondary | ICD-10-CM | POA: Diagnosis not present

## 2022-11-03 DIAGNOSIS — Z794 Long term (current) use of insulin: Secondary | ICD-10-CM

## 2022-11-03 DIAGNOSIS — Z91041 Radiographic dye allergy status: Secondary | ICD-10-CM

## 2022-11-03 DIAGNOSIS — Z7985 Long-term (current) use of injectable non-insulin antidiabetic drugs: Secondary | ICD-10-CM

## 2022-11-03 DIAGNOSIS — Z90710 Acquired absence of both cervix and uterus: Secondary | ICD-10-CM

## 2022-11-03 DIAGNOSIS — G928 Other toxic encephalopathy: Secondary | ICD-10-CM | POA: Diagnosis not present

## 2022-11-03 DIAGNOSIS — Z825 Family history of asthma and other chronic lower respiratory diseases: Secondary | ICD-10-CM

## 2022-11-03 DIAGNOSIS — I110 Hypertensive heart disease with heart failure: Secondary | ICD-10-CM | POA: Diagnosis not present

## 2022-11-03 DIAGNOSIS — R680 Hypothermia, not associated with low environmental temperature: Secondary | ICD-10-CM | POA: Diagnosis present

## 2022-11-03 DIAGNOSIS — Z7951 Long term (current) use of inhaled steroids: Secondary | ICD-10-CM

## 2022-11-03 DIAGNOSIS — Z886 Allergy status to analgesic agent status: Secondary | ICD-10-CM

## 2022-11-03 DIAGNOSIS — G319 Degenerative disease of nervous system, unspecified: Secondary | ICD-10-CM | POA: Diagnosis not present

## 2022-11-03 DIAGNOSIS — F32A Depression, unspecified: Secondary | ICD-10-CM | POA: Diagnosis present

## 2022-11-03 DIAGNOSIS — Z7902 Long term (current) use of antithrombotics/antiplatelets: Secondary | ICD-10-CM

## 2022-11-03 DIAGNOSIS — E66812 Obesity, class 2: Secondary | ICD-10-CM | POA: Diagnosis present

## 2022-11-03 DIAGNOSIS — I5032 Chronic diastolic (congestive) heart failure: Secondary | ICD-10-CM | POA: Diagnosis not present

## 2022-11-03 DIAGNOSIS — R0602 Shortness of breath: Secondary | ICD-10-CM | POA: Diagnosis not present

## 2022-11-03 DIAGNOSIS — I6930 Unspecified sequelae of cerebral infarction: Secondary | ICD-10-CM

## 2022-11-03 DIAGNOSIS — Z885 Allergy status to narcotic agent status: Secondary | ICD-10-CM

## 2022-11-03 DIAGNOSIS — R6889 Other general symptoms and signs: Secondary | ICD-10-CM | POA: Diagnosis not present

## 2022-11-03 DIAGNOSIS — E1169 Type 2 diabetes mellitus with other specified complication: Secondary | ICD-10-CM

## 2022-11-03 LAB — CBG MONITORING, ED: Glucose-Capillary: 197 mg/dL — ABNORMAL HIGH (ref 70–99)

## 2022-11-03 NOTE — ED Provider Notes (Signed)
[place] EMERGENCY DEPARTMENT AT [facility] Provider Note   CSN: [id number] Arrival date & time: 11/03/22  2317  An emergency department physician performed an initial assessment on this suspected stroke patient at 2315.  History  No chief complaint on file.   Lori [name] is a 52 y.o. female.  Patient arrives as a code stroke.  Patient has not been feeling well for several days, reportedly having generalized weakness, nausea, vomiting.  She comes in tonight with new onset speech disturbance and right-sided weakness.       Home Medications Prior to Admission medications   Medication Sig Start Date End Date Taking? Authorizing Provider  albuterol (PROVENTIL) (2.5 MG/3ML) 0.083% nebulizer solution Take 3 mLs (2.5 mg total) by nebulization every 6 (six) hours as needed for wheezing or shortness of breath. 05/21/22   Ngetich, Dinah C, NP  albuterol (VENTOLIN HFA) 108 (90 Base) MCG/ACT inhaler INHALE 2 PUFFS BY MOUTH EVERY 6 HOURS AS NEEDED FOR WHEEZING AND FOR SHORTNESS OF BREATH 10/17/22   Ngetich, Dinah C, NP  atorvastatin (LIPITOR) 80 MG tablet Take 1 tablet (80 mg total) by mouth daily. 09/02/22   Ngetich, Dinah C, NP  baclofen (LIORESAL) 10 MG tablet Take 1 tablet (10 mg total) by mouth 3 (three) times daily. X 1 week, then 10 mg TID-for spasticity- can increase  the dose if you call me to discuss 07/10/22   Maurilio Lovely D, DO  blood glucose meter kit and supplies 1 each by Other route as directed. Dispense based on patient and insurance preference. Use up to four times daily as directed. (FOR ICD-10 E10.9, E11.9). 06/24/22   Ngetich, Dinah C, NP  budesonide (PULMICORT) 0.25 MG/2ML nebulizer solution Take 2 mLs (0.25 mg total) by nebulization 2 (two) times daily. 05/21/22   Ngetich, Dinah C, NP  clopidogrel (PLAVIX) 75 MG tablet Take 1 tablet (75 mg total) by mouth daily. 09/02/22   Ngetich, Donalee Citrin, NP  Continuous Glucose Receiver (FREESTYLE LIBRE 2 READER) DEVI 1 Device  by Does not apply route daily. E11.69 11/01/22   Ngetich, Dinah C, NP  Continuous Glucose Sensor (FREESTYLE LIBRE 14 DAY SENSOR) MISC 1 each by Does not apply route as needed. DX: E11.69 08/06/22   Sharon Seller, NP  cyclobenzaprine (FLEXERIL) 5 MG tablet Take 1 tablet (5 mg total) by mouth 3 (three) times daily as needed for muscle spasms. 07/10/22   Sherryll Burger, Pratik D, DO  diclofenac Sodium (VOLTAREN) 1 % GEL Apply 4 g topically 4 (four) times daily. 06/27/22   Ngetich, Dinah C, NP  dicyclomine (BENTYL) 20 MG tablet Take 1 tablet (20 mg total) by mouth 2 (two) times daily. 10/31/22   Elayne Snare K, DO  escitalopram (LEXAPRO) 20 MG tablet Take 1 tablet (20 mg total) by mouth every morning. 09/02/22   Ngetich, Dinah C, NP  ezetimibe (ZETIA) 10 MG tablet Take 1 tablet (10 mg total) by mouth daily. 09/02/22   Ngetich, Dinah C, NP  gabapentin (NEURONTIN) 100 MG capsule Take 1 capsule (100 mg total) by mouth 3 (three) times daily. 09/02/22   Ngetich, Dinah C, NP  insulin aspart (NOVOLOG) 100 UNIT/ML injection Inject 15 Units into the skin 3 (three) times daily with meals. 11/01/22   Ngetich, Dinah C, NP  Insulin Pen Needle 32G X 4 MM MISC Use to inject insulin 4 times daily as directed. 05/21/22   Ngetich, Donalee Citrin, NP  KLAYESTA powder Apply 1 Application topically 2 (two) times daily. Affected  areas on breast fold, groin, and sacral areas. 05/16/22   [provider]  LANTUS SOLOSTAR 100 UNIT/ML Solostar Pen INJECT 40 UNITS SUBCUTANEOUSLY AT BEDTIME Patient taking differently: Inject 40 Units into the skin at bedtime. 08/21/22   Ngetich, Dinah C, NP  LORazepam (ATIVAN) 0.5 MG tablet Take 1 tablet (0.5 mg total) by mouth at bedtime. 08/27/22   Ngetich, Dinah C, NP  methimazole (TAPAZOLE) 5 MG tablet Take 1 tablet (5 mg total) by mouth every morning. 09/16/22   Ngetich, Dinah C, NP  omeprazole (PRILOSEC) 20 MG capsule Take 1 capsule (20 mg total) by mouth daily. 09/02/22   Ngetich, Dinah C, NP  ondansetron  (ZOFRAN) 4 MG tablet Take 1 tablet (4 mg total) by mouth every 6 (six) hours. 10/31/22   Elayne Snare K, DO  polyethylene glycol powder (GLYCOLAX/MIRALAX) 17 GM/SCOOP powder Take 17 g by mouth daily. Hold for loose stool 06/27/22   Ngetich, Dinah C, NP  ranolazine (RANEXA) 500 MG 12 hr tablet Take 1 tablet (500 mg total) by mouth 2 (two) times daily. 10/09/22 01/07/23  [place], Alvira Philips, DO  rivaroxaban (XARELTO) 10 MG TABS tablet Take 1 tablet (10 mg total) by mouth daily. 09/02/22   Ngetich, Dinah C, NP  Semaglutide,0.25 or 0.5MG /DOS, (OZEMPIC, 0.25 OR 0.5 MG/DOSE,) 2 MG/1.5ML SOPN Inject 0.5 mg into the skin once a week. 11/01/22   Ngetich, Dinah C, NP  senna-docusate (SENOKOT-S) 8.6-50 MG tablet Take 1 tablet by mouth daily. 10/31/22   Elayne Snare K, DO  tamsulosin (FLOMAX) 0.4 MG CAPS capsule Take 1 capsule (0.4 mg total) by mouth at bedtime. 09/02/22   Ngetich, Dinah C, NP  topiramate (TOPAMAX) 25 MG tablet Take 1 tablet (25 mg total) by mouth 2 (two) times daily. 07/10/22   Sherryll Burger, Pratik D, DO  zinc oxide (BALMEX) 11.3 % CREA cream Apply 1 Application topically 2 (two) times daily. 06/27/22   Ngetich, Dinah C, NP      Allergies    Contrast media [iodinated contrast media], Penicillins, Sumatriptan, Vancomycin, Aspirin, Ciprofloxacin, and Morphine    Review of Systems   Review of Systems  Physical Exam Updated Vital Signs BP (!) 86/62   Pulse 94   Resp (!) 7   Ht (P) 5\' 2"  (1.575 m)   SpO2 100%   BMI (P) 36.58 kg/m  Physical Exam Vitals and nursing note reviewed.  Constitutional:      General: She is not in acute distress.    Appearance: She is well-developed.  HENT:     Head: Normocephalic and atraumatic.     Mouth/Throat:     Mouth: Mucous membranes are moist.  Eyes:     General: Vision grossly intact. Gaze aligned appropriately.     Extraocular Movements: Extraocular movements intact.     Conjunctiva/sclera: Conjunctivae normal.  Cardiovascular:     Rate and Rhythm:  Normal rate and regular rhythm.     Pulses: Normal pulses.     Heart sounds: Normal heart sounds, S1 normal and S2 normal. No murmur heard.    No friction rub. No gallop.  Pulmonary:     Effort: Pulmonary effort is normal. No respiratory distress.     Breath sounds: Normal breath sounds.  Abdominal:     General: Bowel sounds are normal.     Palpations: Abdomen is soft.     Tenderness: There is no abdominal tenderness. There is no guarding or rebound.     Hernia: No hernia is present.  Musculoskeletal:  General: No swelling.     Cervical back: Full passive range of motion without pain, normal range of motion and neck supple. No spinous process tenderness or muscular tenderness. Normal range of motion.     Right lower leg: No edema.     Left lower leg: No edema.  Skin:    General: Skin is warm and dry.     Capillary Refill: Capillary refill takes less than 2 seconds.     Findings: No ecchymosis, erythema, rash or wound.  Neurological:     General: No focal deficit present.     Mental Status: She is alert and oriented to person, place, and time.     GCS: GCS eye subscore is 4. GCS verbal subscore is 5. GCS motor subscore is 6.     Cranial Nerves: Cranial nerves 2-12 are intact.     Sensory: Sensation is intact.     Motor: Motor function is intact.     Coordination: Coordination is intact.  Psychiatric:        Attention and Perception: Attention normal.        Mood and Affect: Mood normal.        Speech: Speech is slurred.        Behavior: Behavior normal.     ED Results / Procedures / Treatments   Labs (all labs ordered are listed, but only abnormal results are displayed) Labs Reviewed  CBC - Abnormal; Notable for the following components:      Result Value   WBC 10.7 (*)    Hemoglobin 10.7 (*)    HCT 33.5 (*)    RDW 17.3 (*)    All other components within normal limits  COMPREHENSIVE METABOLIC PANEL - Abnormal; Notable for the following components:   CO2 20 (*)     Glucose, Bld 213 (*)    BUN 30 (*)    Creatinine, Ser 1.83 (*)    Calcium 8.3 (*)    Total Protein 6.1 (*)    Albumin 2.9 (*)    AST 13 (*)    GFR, Estimated 33 (*)    All other components within normal limits  I-STAT CHEM 8, ED - Abnormal; Notable for the following components:   BUN 29 (*)    Creatinine, Ser 2.00 (*)    Glucose, Bld 214 (*)    Calcium, Ion 1.11 (*)    TCO2 21 (*)    Hemoglobin 11.6 (*)    HCT 34.0 (*)    All other components within normal limits  CBG MONITORING, ED - Abnormal; Notable for the following components:   Glucose-Capillary 197 (*)    All other components within normal limits  ETHANOL  PROTIME-INR  APTT  DIFFERENTIAL  RAPID URINE DRUG SCREEN, HOSP PERFORMED  URINALYSIS, ROUTINE W REFLEX MICROSCOPIC  TYPE AND SCREEN    EKG None  Radiology DG Chest Port 1 View  Result Date: 11/04/2022 CLINICAL DATA:  Shortness of breath EXAM: PORTABLE CHEST 1 VIEW COMPARISON:  10/31/2022 FINDINGS: Cardiac shadow is enlarged but stable. Postsurgical changes are again noted. Lungs are hypoinflated but clear. No bony abnormality is noted. IMPRESSION: No active disease. Electronically Signed   By: Alcide Clever M.D.   On: 11/04/2022 03:06   MR BRAIN WO CONTRAST  Result Date: 11/04/2022 CLINICAL DATA:  Initial evaluation for neuro deficit, stroke suspected. EXAM: MRI HEAD WITHOUT CONTRAST TECHNIQUE: Multiplanar, multiecho pulse sequences of the brain and surrounding structures were obtained without intravenous contrast. COMPARISON:  Prior study from  11/03/2022 as well as earlier exams. FINDINGS: Brain: Mildly advanced cerebral atrophy for age. Patchy and confluent T2/FLAIR hyperintensity involving the periventricular and deep white matter both cerebral hemispheres, consistent with chronic small vessel ischemic disease, moderately advanced in nature. Multiple scattered remote lacunar infarcts present about the deep gray nuclei and pons. Remote left PICA territory infarct  involving the inferior left cerebellum. Small remote left frontal infarct involving the left frontal operculum. No evidence for acute or subacute ischemia. Gray-white matter differentiation maintained. No other areas of chronic cortical infarction. No acute intracranial hemorrhage. Multiple scattered chronic micro hemorrhages noted clustered about the brainstem and deep gray nuclei, most characteristic of chronic poorly controlled hypertension. No mass lesion, midline shift or mass effect. No hydrocephalus or extra-axial fluid collection. Pituitary gland suprasellar region within normal limits. Vascular: Absent flow void within the left V4 segment, which could be related to slow flow and/or occlusion, stable. Major intracranial vascular flow voids are otherwise maintained. Skull and upper cervical spine: Craniocervical junction within normal limits. Bone marrow signal intensity normal. No scalp soft tissue abnormality. Sinuses/Orbits: 1 cm well-circumscribed FLAIR hyperintense lesion noted at the superior aspect of the left orbit, along the superior margin of the left lacrimal gland (series 5, image 14), indeterminate, but relatively stable from prior exams. Globes and orbital soft tissues otherwise unremarkable. Paranasal sinuses are clear. No significant mastoid effusion. Other: None. IMPRESSION: 1. No acute intracranial abnormality. 2. Mildly advanced cerebral atrophy with moderately advanced chronic microvascular ischemic disease, with multiple remote lacunar infarcts involving the deep gray nuclei and pons. Remote left PICA territory infarct, with additional small remote left frontal infarct. 3. Multiple scattered chronic micro hemorrhages clustered about the brainstem and deep gray nuclei, most characteristic of chronic poorly controlled hypertension. 4. 1 cm well-circumscribed FLAIR hyperintense lesion at the superior aspect of the left orbit, indeterminate, but relatively stable from prior exams. Nonemergent  outpatient ophthalmology referral suggested for further workup and evaluation. Electronically Signed   By: Rise Mu M.D.   On: 11/04/2022 01:36   CT HEAD CODE STROKE WO CONTRAST  Result Date: 11/03/2022 CLINICAL DATA:  Code stroke. Initial evaluation for neuro deficit, stroke. EXAM: CT HEAD WITHOUT CONTRAST TECHNIQUE: Contiguous axial images were obtained from the base of the skull through the vertex without intravenous contrast. RADIATION DOSE REDUCTION: This exam was performed according to the departmental dose-optimization program which includes automated exposure control, adjustment of the mA and/or kV according to patient size and/or use of iterative reconstruction technique. COMPARISON:  Prior CT from 05/22/2022. FINDINGS: Brain: Generalized age-related cerebral atrophy with advanced chronic microvascular ischemic disease. Scattered remote lacunar infarcts present about the deep gray nuclei. Chronic left frontal infarct. Chronic left cerebellar infarct. No acute intracranial hemorrhage. There is question of parenchymal hypodensity involving the superior right cerebellar hemisphere (series 5, image 23). Finding is not entirely certain, and could be artifactual. No other acute large vessel territory infarct. No mass lesion or midline shift. No hydrocephalus or extra-axial fluid collection. Vascular: No abnormal hyperdense vessel. Calcified atherosclerosis present at skull base. Skull: Scalp soft tissues within normal limits.  Calvarium intact. Sinuses/Orbits: Globes and orbital soft tissues within normal limits. Paranasal sinuses and mastoid air cells are clear. Other: None. ASPECTS [facility] Stroke Program Early CT Score) - Ganglionic level infarction (caudate, lentiform nuclei, internal capsule, insula, M1-M3 cortex): 7 - Supraganglionic infarction (M4-M6 cortex): 3 Total score (0-10 with 10 being normal): 10 IMPRESSION: 1. Question parenchymal hypodensity involving the superior right cerebellar  hemisphere. While this  finding could be artifactual nature, a possible evolving ischemic infarct is difficult to exclude. Correlation with dedicated MRI could be performed for further evaluation as warranted. No intracranial hemorrhage. 2. No other acute intracranial abnormality. 3. Aspects is 10. 4. Underlying atrophy with chronic small vessel ischemic disease with multiple chronic infarcts as above. These results were communicated to Dr. Derry Lory at 11:37 pm on 11/03/2022 by text page via the [facility] messaging system. Electronically Signed   By: Rise Mu M.D.   On: 11/03/2022 23:38    Procedures Procedures    Medications Ordered in ED Medications  naloxone [facility]) injection 0.4 mg (has no administration in time range)  lactated ringers bolus 1,000 mL (0 mLs Intravenous Stopped 11/04/22 0309)    ED Course/ Medical Decision Making/ A&P                                 Medical Decision Making Amount and/or Complexity of Data Reviewed Labs: ordered. Radiology: ordered.  Risk Prescription drug management.   Differential diagnosis considered includes, but not limited to: TIA; Stroke; ICH; Seizure; electrolyte abnormality; hypoglycemia; toxic/pharmacologic causes; CNS infection; psychiatric disorder  Patient brought to the emergency department for evaluation of possible stroke.  She comes to the ER by ambulance from home.  Patient has been ill for several days.  Patient with nausea and vomiting that has been ongoing.  Tonight she started noticing weakness on the right side of her body and slurred speech.  She does have a previous stroke with left-sided deficits.  Right-sided deficits tonight are new.  Patient evaluated in conjunction with Dr. Derry Lory of neurology as a code stroke.  Screening CT did not show acute abnormality.  Patient brought emergently for MRI which does not show acute stroke.  No further treatment recommended by Dr. Derry Lory, pursue medical  workup.  Patient hypotensive with acute kidney injury.  This is consistent with dehydration secondary to nausea and vomiting.  Patient with no abdominal pain, benign abdominal exam.  Blood pressures are improving with IV fluids.  Patient seems sedated.  She does report multiple sedating medications at home.  She denies overdose.  Patient with decreased respiratory rate when sleeping but does arouse and become alert to voice.  Reviewing her records she does have a history of aspiration.  No hypoxia currently, would need to monitor.  No evidence of stroke.  Patient with AKI secondary to nausea and vomiting, will hospitalize for further hydration and monitoring.        Final Clinical Impression(s) / ED Diagnoses Final diagnoses:  AKI (acute kidney injury) [facility])    Rx / DC Orders ED Discharge Orders     None         Brigitte Soderberg, Canary Brim, MD 11/04/22 [phone]

## 2022-11-04 ENCOUNTER — Encounter (HOSPITAL_COMMUNITY): Payer: Self-pay | Admitting: *Deleted

## 2022-11-04 ENCOUNTER — Emergency Department (HOSPITAL_COMMUNITY): Payer: 59

## 2022-11-04 ENCOUNTER — Other Ambulatory Visit: Payer: Self-pay

## 2022-11-04 DIAGNOSIS — G9340 Encephalopathy, unspecified: Secondary | ICD-10-CM | POA: Diagnosis present

## 2022-11-04 DIAGNOSIS — R0602 Shortness of breath: Secondary | ICD-10-CM | POA: Diagnosis not present

## 2022-11-04 DIAGNOSIS — R040 Epistaxis: Secondary | ICD-10-CM | POA: Diagnosis not present

## 2022-11-04 DIAGNOSIS — E1165 Type 2 diabetes mellitus with hyperglycemia: Secondary | ICD-10-CM | POA: Diagnosis present

## 2022-11-04 DIAGNOSIS — Z794 Long term (current) use of insulin: Secondary | ICD-10-CM

## 2022-11-04 DIAGNOSIS — F32A Depression, unspecified: Secondary | ICD-10-CM | POA: Diagnosis present

## 2022-11-04 DIAGNOSIS — R531 Weakness: Secondary | ICD-10-CM | POA: Diagnosis not present

## 2022-11-04 DIAGNOSIS — T50915A Adverse effect of multiple unspecified drugs, medicaments and biological substances, initial encounter: Secondary | ICD-10-CM | POA: Diagnosis present

## 2022-11-04 DIAGNOSIS — G8191 Hemiplegia, unspecified affecting right dominant side: Secondary | ICD-10-CM | POA: Diagnosis present

## 2022-11-04 DIAGNOSIS — G8194 Hemiplegia, unspecified affecting left nondominant side: Secondary | ICD-10-CM

## 2022-11-04 DIAGNOSIS — E861 Hypovolemia: Secondary | ICD-10-CM

## 2022-11-04 DIAGNOSIS — I10 Essential (primary) hypertension: Secondary | ICD-10-CM | POA: Diagnosis not present

## 2022-11-04 DIAGNOSIS — G319 Degenerative disease of nervous system, unspecified: Secondary | ICD-10-CM | POA: Diagnosis not present

## 2022-11-04 DIAGNOSIS — G9341 Metabolic encephalopathy: Secondary | ICD-10-CM | POA: Diagnosis present

## 2022-11-04 DIAGNOSIS — I5032 Chronic diastolic (congestive) heart failure: Secondary | ICD-10-CM | POA: Diagnosis present

## 2022-11-04 DIAGNOSIS — Z7901 Long term (current) use of anticoagulants: Secondary | ICD-10-CM | POA: Diagnosis not present

## 2022-11-04 DIAGNOSIS — I959 Hypotension, unspecified: Secondary | ICD-10-CM | POA: Diagnosis present

## 2022-11-04 DIAGNOSIS — I2510 Atherosclerotic heart disease of native coronary artery without angina pectoris: Secondary | ICD-10-CM | POA: Diagnosis present

## 2022-11-04 DIAGNOSIS — I110 Hypertensive heart disease with heart failure: Secondary | ICD-10-CM | POA: Diagnosis present

## 2022-11-04 DIAGNOSIS — D72829 Elevated white blood cell count, unspecified: Secondary | ICD-10-CM | POA: Diagnosis present

## 2022-11-04 DIAGNOSIS — R112 Nausea with vomiting, unspecified: Secondary | ICD-10-CM

## 2022-11-04 DIAGNOSIS — E039 Hypothyroidism, unspecified: Secondary | ICD-10-CM | POA: Diagnosis present

## 2022-11-04 DIAGNOSIS — Z2239 Carrier of other specified bacterial diseases: Secondary | ICD-10-CM | POA: Diagnosis not present

## 2022-11-04 DIAGNOSIS — G928 Other toxic encephalopathy: Secondary | ICD-10-CM | POA: Diagnosis present

## 2022-11-04 DIAGNOSIS — I517 Cardiomegaly: Secondary | ICD-10-CM | POA: Diagnosis not present

## 2022-11-04 DIAGNOSIS — Z8679 Personal history of other diseases of the circulatory system: Secondary | ICD-10-CM

## 2022-11-04 DIAGNOSIS — Z8673 Personal history of transient ischemic attack (TIA), and cerebral infarction without residual deficits: Secondary | ICD-10-CM | POA: Diagnosis not present

## 2022-11-04 DIAGNOSIS — R471 Dysarthria and anarthria: Secondary | ICD-10-CM | POA: Diagnosis present

## 2022-11-04 DIAGNOSIS — I6782 Cerebral ischemia: Secondary | ICD-10-CM | POA: Diagnosis not present

## 2022-11-04 DIAGNOSIS — E66812 Obesity, class 2: Secondary | ICD-10-CM | POA: Diagnosis present

## 2022-11-04 DIAGNOSIS — N179 Acute kidney failure, unspecified: Secondary | ICD-10-CM

## 2022-11-04 DIAGNOSIS — R479 Unspecified speech disturbances: Secondary | ICD-10-CM | POA: Diagnosis present

## 2022-11-04 DIAGNOSIS — E119 Type 2 diabetes mellitus without complications: Secondary | ICD-10-CM | POA: Diagnosis not present

## 2022-11-04 DIAGNOSIS — E0590 Thyrotoxicosis, unspecified without thyrotoxic crisis or storm: Secondary | ICD-10-CM

## 2022-11-04 DIAGNOSIS — Z7902 Long term (current) use of antithrombotics/antiplatelets: Secondary | ICD-10-CM

## 2022-11-04 DIAGNOSIS — Z6836 Body mass index (BMI) 36.0-36.9, adult: Secondary | ICD-10-CM | POA: Diagnosis not present

## 2022-11-04 DIAGNOSIS — R29810 Facial weakness: Secondary | ICD-10-CM | POA: Diagnosis present

## 2022-11-04 DIAGNOSIS — I69354 Hemiplegia and hemiparesis following cerebral infarction affecting left non-dominant side: Secondary | ICD-10-CM | POA: Diagnosis not present

## 2022-11-04 DIAGNOSIS — E896 Postprocedural adrenocortical (-medullary) hypofunction: Secondary | ICD-10-CM | POA: Diagnosis present

## 2022-11-04 DIAGNOSIS — Z7401 Bed confinement status: Secondary | ICD-10-CM | POA: Diagnosis not present

## 2022-11-04 DIAGNOSIS — D638 Anemia in other chronic diseases classified elsewhere: Secondary | ICD-10-CM | POA: Diagnosis present

## 2022-11-04 HISTORY — DX: Metabolic encephalopathy: G93.41

## 2022-11-04 LAB — CBC
HCT: 33.5 % — ABNORMAL LOW (ref 36.0–46.0)
HCT: 32.7 % — ABNORMAL LOW (ref 36.0–46.0)
Hemoglobin: 10.7 g/dL — ABNORMAL LOW (ref 12.0–15.0)
Hemoglobin: 10.3 g/dL — ABNORMAL LOW (ref 12.0–15.0)
MCH: 26 pg (ref 26.0–34.0)
MCH: 25.7 pg — ABNORMAL LOW (ref 26.0–34.0)
MCHC: 31.9 g/dL (ref 30.0–36.0)
MCHC: 31.5 g/dL (ref 30.0–36.0)
MCV: 81.3 fL (ref 80.0–100.0)
MCV: 81.5 fL (ref 80.0–100.0)
Platelets: 364 10*3/uL (ref 150–400)
Platelets: 353 10*3/uL (ref 150–400)
RBC: 4.12 MIL/uL (ref 3.87–5.11)
RBC: 4.01 MIL/uL (ref 3.87–5.11)
RDW: 17.3 % — ABNORMAL HIGH (ref 11.5–15.5)
RDW: 17.3 % — ABNORMAL HIGH (ref 11.5–15.5)
WBC: 10.7 10*3/uL — ABNORMAL HIGH (ref 4.0–10.5)
WBC: 11.2 10*3/uL — ABNORMAL HIGH (ref 4.0–10.5)
nRBC: 0 % (ref 0.0–0.2)
nRBC: 0 % (ref 0.0–0.2)

## 2022-11-04 LAB — URINALYSIS, ROUTINE W REFLEX MICROSCOPIC
Bilirubin Urine: NEGATIVE
Glucose, UA: NEGATIVE mg/dL
Ketones, ur: NEGATIVE mg/dL
Nitrite: NEGATIVE
Protein, ur: NEGATIVE mg/dL
Specific Gravity, Urine: 1.01 (ref 1.005–1.030)
pH: 5 (ref 5.0–8.0)

## 2022-11-04 LAB — I-STAT CG4 LACTIC ACID, ED: Lactic Acid, Venous: 2.7 mmol/L (ref 0.5–1.9)

## 2022-11-04 LAB — RAPID URINE DRUG SCREEN, HOSP PERFORMED
Amphetamines: NOT DETECTED
Barbiturates: NOT DETECTED
Benzodiazepines: POSITIVE — AB
Cocaine: NOT DETECTED
Opiates: NOT DETECTED
Tetrahydrocannabinol: NOT DETECTED

## 2022-11-04 LAB — I-STAT CHEM 8, ED
BUN: 29 mg/dL — ABNORMAL HIGH (ref 6–20)
Calcium, Ion: 1.11 mmol/L — ABNORMAL LOW (ref 1.15–1.40)
Chloride: 105 mmol/L (ref 98–111)
Creatinine, Ser: 2 mg/dL — ABNORMAL HIGH (ref 0.44–1.00)
Glucose, Bld: 214 mg/dL — ABNORMAL HIGH (ref 70–99)
HCT: 34 % — ABNORMAL LOW (ref 36.0–46.0)
Hemoglobin: 11.6 g/dL — ABNORMAL LOW (ref 12.0–15.0)
Potassium: 4.1 mmol/L (ref 3.5–5.1)
Sodium: 137 mmol/L (ref 135–145)
TCO2: 21 mmol/L — ABNORMAL LOW (ref 22–32)

## 2022-11-04 LAB — COMPREHENSIVE METABOLIC PANEL
ALT: 14 U/L (ref 0–44)
AST: 13 U/L — ABNORMAL LOW (ref 15–41)
Albumin: 2.9 g/dL — ABNORMAL LOW (ref 3.5–5.0)
Alkaline Phosphatase: 71 U/L (ref 38–126)
Anion gap: 13 (ref 5–15)
BUN: 30 mg/dL — ABNORMAL HIGH (ref 6–20)
CO2: 20 mmol/L — ABNORMAL LOW (ref 22–32)
Calcium: 8.3 mg/dL — ABNORMAL LOW (ref 8.9–10.3)
Chloride: 103 mmol/L (ref 98–111)
Creatinine, Ser: 1.83 mg/dL — ABNORMAL HIGH (ref 0.44–1.00)
GFR, Estimated: 33 mL/min — ABNORMAL LOW (ref >60)
Glucose, Bld: 213 mg/dL — ABNORMAL HIGH (ref 70–99)
Potassium: 4 mmol/L (ref 3.5–5.1)
Sodium: 136 mmol/L (ref 135–145)
Total Bilirubin: 0.3 mg/dL (ref 0.3–1.2)
Total Protein: 6.1 g/dL — ABNORMAL LOW (ref 6.5–8.1)

## 2022-11-04 LAB — BASIC METABOLIC PANEL
Anion gap: 13 (ref 5–15)
BUN: 28 mg/dL — ABNORMAL HIGH (ref 6–20)
CO2: 20 mmol/L — ABNORMAL LOW (ref 22–32)
Calcium: 8.4 mg/dL — ABNORMAL LOW (ref 8.9–10.3)
Chloride: 104 mmol/L (ref 98–111)
Creatinine, Ser: 1.68 mg/dL — ABNORMAL HIGH (ref 0.44–1.00)
GFR, Estimated: 37 mL/min — ABNORMAL LOW (ref >60)
Glucose, Bld: 184 mg/dL — ABNORMAL HIGH (ref 70–99)
Potassium: 3.9 mmol/L (ref 3.5–5.1)
Sodium: 137 mmol/L (ref 135–145)

## 2022-11-04 LAB — ETHANOL: Alcohol, Ethyl (B): <10 mg/dL (ref <10)

## 2022-11-04 LAB — GLUCOSE, CAPILLARY
Glucose-Capillary: 275 mg/dL — ABNORMAL HIGH (ref 70–99)
Glucose-Capillary: 239 mg/dL — ABNORMAL HIGH (ref 70–99)

## 2022-11-04 LAB — LIPASE, BLOOD: Lipase: 19 U/L (ref 11–51)

## 2022-11-04 LAB — CBG MONITORING, ED
Glucose-Capillary: 164 mg/dL — ABNORMAL HIGH (ref 70–99)
Glucose-Capillary: 151 mg/dL — ABNORMAL HIGH (ref 70–99)
Glucose-Capillary: 139 mg/dL — ABNORMAL HIGH (ref 70–99)

## 2022-11-04 LAB — DIFFERENTIAL
Abs Immature Granulocytes: 0.05 10*3/uL (ref 0.00–0.07)
Basophils Absolute: 0.1 10*3/uL (ref 0.0–0.1)
Basophils Relative: 1 %
Eosinophils Absolute: 0.3 10*3/uL (ref 0.0–0.5)
Eosinophils Relative: 3 %
Immature Granulocytes: 1 %
Lymphocytes Relative: 31 %
Lymphs Abs: 3.3 10*3/uL (ref 0.7–4.0)
Monocytes Absolute: 0.8 10*3/uL (ref 0.1–1.0)
Monocytes Relative: 7 %
Neutro Abs: 6.2 10*3/uL (ref 1.7–7.7)
Neutrophils Relative %: 57 %

## 2022-11-04 LAB — PROCALCITONIN: Procalcitonin: <0.1 ng/mL

## 2022-11-04 LAB — PROTIME-INR
INR: 1.1 (ref 0.8–1.2)
Prothrombin Time: 14.4 s (ref 11.4–15.2)

## 2022-11-04 LAB — CORTISOL: Cortisol, Plasma: 5.9 ug/dL

## 2022-11-04 LAB — ABO/RH: ABO/RH(D): O POS

## 2022-11-04 LAB — TSH: TSH: 0.547 u[IU]/mL (ref 0.350–4.500)

## 2022-11-04 LAB — APTT: aPTT: 33 s (ref 24–36)

## 2022-11-04 LAB — TYPE AND SCREEN
ABO/RH(D): O POS
Antibody Screen: NEGATIVE

## 2022-11-04 MED ORDER — NALOXONE HCL 0.4 MG/ML IJ SOLN
0.4000 mg | Freq: Once | INTRAMUSCULAR | Status: DC
  Filled 2022-11-04: qty 1

## 2022-11-04 MED ORDER — EZETIMIBE 10 MG PO TABS
10.0000 mg | ORAL_TABLET | Freq: Every day | ORAL | Status: DC
  Filled 2022-11-04: qty 1

## 2022-11-04 MED ORDER — ONDANSETRON HCL 4 MG/2ML IJ SOLN
4.0000 mg | Freq: Four times a day (QID) | INTRAMUSCULAR | Status: DC | PRN
  Administered 2022-11-05 (×2): 4 mg via INTRAVENOUS
  Filled 2022-11-04 (×2): qty 2

## 2022-11-04 MED ORDER — HYDROXYZINE HCL 10 MG PO TABS: 10.0000 mg | ORAL_TABLET | Freq: Three times a day (TID) | ORAL | Status: DC | PRN

## 2022-11-04 MED ORDER — INSULIN GLARGINE-YFGN 100 UNIT/ML ~~LOC~~ SOLN
20.0000 [IU] | Freq: Every day | SUBCUTANEOUS | Status: DC
  Administered 2022-11-04 – 2022-11-06 (×3): 20 [IU] via SUBCUTANEOUS
  Filled 2022-11-04 (×4): qty 0.2

## 2022-11-04 MED ORDER — DIPHENHYDRAMINE-ZINC ACETATE 2-0.1 % EX CREA
TOPICAL_CREAM | Freq: Two times a day (BID) | CUTANEOUS | Status: DC | PRN
  Filled 2022-11-04: qty 28

## 2022-11-04 MED ORDER — ESCITALOPRAM OXALATE 10 MG PO TABS
20.0000 mg | ORAL_TABLET | Freq: Every morning | ORAL | Status: DC
  Administered 2022-11-05 – 2022-11-07 (×3): 20 mg via ORAL
  Filled 2022-11-04 (×3): qty 2

## 2022-11-04 MED ORDER — PANTOPRAZOLE SODIUM 40 MG PO TBEC
40.0000 mg | DELAYED_RELEASE_TABLET | Freq: Every day | ORAL | Status: DC
  Administered 2022-11-04 – 2022-11-07 (×4): 40 mg via ORAL
  Filled 2022-11-04 (×4): qty 1

## 2022-11-04 MED ORDER — TOPIRAMATE 25 MG PO TABS
25.0000 mg | ORAL_TABLET | Freq: Two times a day (BID) | ORAL | Status: DC
  Administered 2022-11-04 – 2022-11-07 (×6): 25 mg via ORAL
  Filled 2022-11-04 (×7): qty 1

## 2022-11-04 MED ORDER — BUDESONIDE 0.25 MG/2ML IN SUSP: 0.2500 mg | Freq: Two times a day (BID) | RESPIRATORY_TRACT | Status: DC

## 2022-11-04 MED ORDER — SODIUM CHLORIDE 0.9 % IV SOLN
2.0000 g | Freq: Two times a day (BID) | INTRAVENOUS | Status: DC
  Administered 2022-11-04 – 2022-11-05 (×3): 2 g via INTRAVENOUS
  Filled 2022-11-04 (×3): qty 12.5

## 2022-11-04 MED ORDER — VANCOMYCIN HCL 750 MG/150ML IV SOLN
750.0000 mg | INTRAVENOUS | Status: DC
  Administered 2022-11-05: 750 mg via INTRAVENOUS
  Filled 2022-11-04: qty 150

## 2022-11-04 MED ORDER — INSULIN ASPART 100 UNIT/ML IJ SOLN
0.0000 [IU] | INTRAMUSCULAR | Status: DC
  Administered 2022-11-04: 1 [IU] via SUBCUTANEOUS
  Administered 2022-11-04: 2 [IU] via SUBCUTANEOUS
  Administered 2022-11-04: 5 [IU] via SUBCUTANEOUS
  Administered 2022-11-04: 3 [IU] via SUBCUTANEOUS
  Administered 2022-11-04: 2 [IU] via SUBCUTANEOUS
  Administered 2022-11-05: 1 [IU] via SUBCUTANEOUS
  Administered 2022-11-05 (×2): 3 [IU] via SUBCUTANEOUS
  Administered 2022-11-05: 2 [IU] via SUBCUTANEOUS
  Administered 2022-11-05: 7 [IU] via SUBCUTANEOUS
  Administered 2022-11-05: 2 [IU] via SUBCUTANEOUS
  Administered 2022-11-06: 1 [IU] via SUBCUTANEOUS
  Administered 2022-11-06 (×4): 2 [IU] via SUBCUTANEOUS
  Administered 2022-11-07 (×2): 1 [IU] via SUBCUTANEOUS

## 2022-11-04 MED ORDER — CLOPIDOGREL BISULFATE 75 MG PO TABS
75.0000 mg | ORAL_TABLET | Freq: Every day | ORAL | Status: DC
  Administered 2022-11-04 – 2022-11-07 (×4): 75 mg via ORAL
  Filled 2022-11-04 (×4): qty 1

## 2022-11-04 MED ORDER — VANCOMYCIN HCL 2000 MG/400ML IV SOLN
2000.0000 mg | Freq: Once | INTRAVENOUS | Status: DC
  Filled 2022-11-04: qty 400

## 2022-11-04 MED ORDER — INSULIN GLARGINE-YFGN 100 UNIT/ML ~~LOC~~ SOLN
20.0000 [IU] | Freq: Every day | SUBCUTANEOUS | Status: DC
Start: 2022-11-04 — End: 2022-11-04

## 2022-11-04 MED ORDER — DICYCLOMINE HCL 20 MG PO TABS
20.0000 mg | ORAL_TABLET | Freq: Two times a day (BID) | ORAL | Status: DC
  Filled 2022-11-04 (×8): qty 1

## 2022-11-04 MED ORDER — ALBUTEROL SULFATE (2.5 MG/3ML) 0.083% IN NEBU: 2.5000 mg | INHALATION_SOLUTION | RESPIRATORY_TRACT | Status: DC | PRN

## 2022-11-04 MED ORDER — SODIUM CHLORIDE 0.9 % IV BOLUS: 1000.0000 mL | Freq: Once | INTRAVENOUS | Status: DC

## 2022-11-04 MED ORDER — ACETAMINOPHEN 650 MG RE SUPP: 650.0000 mg | Freq: Four times a day (QID) | RECTAL | Status: DC | PRN

## 2022-11-04 MED ORDER — METHIMAZOLE 5 MG PO TABS
5.0000 mg | ORAL_TABLET | Freq: Every morning | ORAL | Status: DC
  Administered 2022-11-04 – 2022-11-07 (×4): 5 mg via ORAL
  Filled 2022-11-04 (×4): qty 1

## 2022-11-04 MED ORDER — ACETAMINOPHEN 325 MG PO TABS
650.0000 mg | ORAL_TABLET | Freq: Four times a day (QID) | ORAL | Status: DC | PRN
  Administered 2022-11-06: 650 mg via ORAL
  Filled 2022-11-04: qty 2

## 2022-11-04 MED ORDER — COSYNTROPIN 0.25 MG IJ SOLR
0.2500 mg | Freq: Once | INTRAMUSCULAR | Status: AC
  Administered 2022-11-05: 0.25 mg via INTRAVENOUS
  Filled 2022-11-04: qty 0.25

## 2022-11-04 MED ORDER — BUDESONIDE 0.25 MG/2ML IN SUSP
0.2500 mg | Freq: Two times a day (BID) | RESPIRATORY_TRACT | Status: DC
  Administered 2022-11-05: 0.25 mg via RESPIRATORY_TRACT
  Filled 2022-11-04 (×5): qty 2

## 2022-11-04 MED ORDER — LACTATED RINGERS IV BOLUS
1000.0000 mL | Freq: Once | INTRAVENOUS | Status: AC
  Administered 2022-11-04: 1000 mL via INTRAVENOUS

## 2022-11-04 MED ORDER — RANOLAZINE ER 500 MG PO TB12
500.0000 mg | ORAL_TABLET | Freq: Two times a day (BID) | ORAL | Status: DC
  Administered 2022-11-04 – 2022-11-07 (×6): 500 mg via ORAL
  Filled 2022-11-04 (×9): qty 1

## 2022-11-04 MED ORDER — DEXAMETHASONE SODIUM PHOSPHATE 4 MG/ML IJ SOLN
4.0000 mg | Freq: Two times a day (BID) | INTRAMUSCULAR | Status: AC
  Administered 2022-11-04 – 2022-11-05 (×2): 4 mg via INTRAVENOUS
  Filled 2022-11-04 (×3): qty 1

## 2022-11-04 MED ORDER — ATORVASTATIN CALCIUM 40 MG PO TABS
80.0000 mg | ORAL_TABLET | Freq: Every day | ORAL | Status: DC
  Administered 2022-11-04 – 2022-11-06 (×3): 80 mg via ORAL
  Filled 2022-11-04 (×3): qty 2

## 2022-11-04 MED ORDER — ONDANSETRON HCL 4 MG PO TABS: 4.0000 mg | ORAL_TABLET | Freq: Four times a day (QID) | ORAL | Status: DC | PRN

## 2022-11-04 MED ORDER — LACTATED RINGERS IV SOLN: INTRAVENOUS | Status: DC

## 2022-11-04 MED ORDER — RIVAROXABAN 10 MG PO TABS
10.0000 mg | ORAL_TABLET | Freq: Every day | ORAL | Status: DC
  Administered 2022-11-04: 10 mg via ORAL
  Filled 2022-11-04 (×3): qty 1

## 2022-11-04 NOTE — ED Notes (Signed)
The pt  cleaned up  dressed in a gown  pure wick placed  pt arrouses only a small amount

## 2022-11-04 NOTE — Assessment & Plan Note (Signed)
Cont' plavix ?

## 2022-11-04 NOTE — Progress Notes (Signed)
Pharmacy Antibiotic Note  Lori [name] is a 52 y.o. female admitted on 11/03/2022 with code stroke given reported right sided weakness now with concerns for acute encephalopathy and sepsis. Pharmacy has been consulted for cefepime and vancomycin dosing.  WBC 11.2, sCr 1.68, afebrile, Urine/blood cultures collected. No antibiotics given. Reported penicillin allergy, but has tolerated cephalosporins in the past  Plan: -Cefepime 2g IV every 12 hours -Vancomycin 2000mg  IV x1 -Vancomycin 750mg  IV every 24 hours (AUC 538, Vd 0.5, IBW) -Monitor renal function -Follow up signs of clinical improvement, LOT, de-escalation of antibiotics  Height: (P) 5\' 2"  (157.5 cm) IBW/kg (Calculated) : (P) 50.1  Temp (24hrs), Avg:96.1 F (35.6 C), Min:96.1 F (35.6 C), Max:96.1 F (35.6 C)  Recent Labs  Lab 10/31/22 1315 11/01/22 1609 11/04/22 0054 11/04/22 0101 11/04/22 0456  WBC 8.9  --  10.7*  --  11.2*  CREATININE 1.73* 1.01 1.83* 2.00* 1.68*    Estimated Creatinine Clearance: 41.9 mL/min (A) (by C-G formula based on SCr of 1.68 mg/dL (H)).    Allergies  Allergen Reactions   Contrast Media [Iodinated Contrast Media] Anaphylaxis and Swelling   Penicillins Swelling    Mouth swells up and eyes swollen shut   Sumatriptan Anaphylaxis   Vancomycin Other (See Comments)    Red Man's Syndrome   Aspirin Swelling   Ciprofloxacin     Feel nausea/vomitting   Morphine Swelling    Antimicrobials this admission: Cefepime 9/30 >>  Vancomycin 9/30 >>   Microbiology results: 9/30 BCx:  9/30 UCx:    Thank you for allowing pharmacy to be a part of this patient's care.  Arabella Merles, PharmD. Clinical Pharmacist 11/04/2022 7:10 AM

## 2022-11-04 NOTE — Assessment & Plan Note (Addendum)
Sensitive SSI Q4H for the moment. Will reduce lantus to 20u at bedtime for the moment (looks like she was on 40 at bedtime at baseline based on prior med-rec).

## 2022-11-04 NOTE — ED Notes (Signed)
Pt has called nurse into room multiple times for many reasons. RN explained that there are many pts in the department that are critical that need attention and she should try to ask for things together instead of calling out multiple times.

## 2022-11-04 NOTE — ED Notes (Signed)
ED TO INPATIENT HANDOFF REPORT  ED Nurse Name and Phone #: chris [phone]  S Name/Age/Gender Lori [name] 52 y.o. female Room/Bed: TRACC/TRACC  Code Status   Code Status: Prior  Home/SNF/Other Home Patient oriented to: self Is this baseline? Yes   Triage Complete: Triage complete  Chief Complaint Stroke  Triage Note No notes on file   Allergies Allergies  Allergen Reactions   Contrast Media [Iodinated Contrast Media] Anaphylaxis and Swelling   Penicillins Swelling    Mouth swells up and eyes swollen shut   Sumatriptan Anaphylaxis   Vancomycin Other (See Comments)    Red Man's Syndrome   Aspirin Swelling   Ciprofloxacin     Feel nausea/vomitting   Morphine Swelling    Level of Care/Admitting Diagnosis ED Disposition     None       B Medical/Surgery History Past Medical History:  Diagnosis Date   CAD (coronary artery disease)    Depression    Diabetes mellitus without complication (HCC)    History of CT scan    History of mammogram    History of MRI    Hypertension    Pheochromocytoma    Stroke (HCC)    Thyroid disease    Past Surgical History:  Procedure Laterality Date   ABDOMINAL HYSTERECTOMY     CESAREAN SECTION     3   CORONARY ARTERY BYPASS GRAFT     Right adrenal gland removal for pheochromocytoma Right      A IV Location/Drains/Wounds Patient Lines/Drains/Airways Status     Active Line/Drains/Airways     Name Placement date Placement time Site Days   Peripheral IV 11/04/22 20 G 2.5" Left Antecubital 11/04/22  0052  Antecubital  less than 1   External Urinary Catheter 11/04/22  0130  --  less than 1            Intake/Output Last 24 hours No intake or output data in the 24 hours ending 11/04/22 0335  Labs/Imaging Results for orders placed or performed during the hospital encounter of 11/03/22 (from the past 48 hour(s))  CBG monitoring, ED     Status: Abnormal   Collection Time: 11/03/22 11:17 PM  Result Value Ref  Range   Glucose-Capillary 197 (H) 70 - 99 mg/dL    Comment: Glucose reference range applies only to samples taken after fasting for at least 8 hours.  Ethanol     Status: None   Collection Time: 11/04/22 12:54 AM  Result Value Ref Range   Alcohol, Ethyl (B) <10 <10 mg/dL    Comment: (NOTE) Lowest detectable limit for serum alcohol is 10 mg/dL.  For medical purposes only. Performed at [facility], [address], [place], [place] [address]   Protime-INR     Status: None   Collection Time: 11/04/22 12:54 AM  Result Value Ref Range   Prothrombin Time 14.4 11.4 - 15.2 seconds   INR 1.1 0.8 - 1.2    Comment: (NOTE) INR goal varies based on device and disease states. Performed at [facility], [address], [place], [place] [address]   APTT     Status: None   Collection Time: 11/04/22 12:54 AM  Result Value Ref Range   aPTT 33 24 - 36 seconds    Comment: Performed at [facility], [address] [place], [place] [address]  CBC     Status: Abnormal   Collection Time: 11/04/22 12:54 AM  Result Value Ref Range  WBC 10.7 (H) 4.0 - 10.5 K/uL   RBC 4.12 3.87 - 5.11 MIL/uL   Hemoglobin 10.7 (L) 12.0 - 15.0 g/dL   HCT 16.1 (L) 09.6 - 04.5 %   MCV 81.3 80.0 - 100.0 fL   MCH 26.0 26.0 - 34.0 pg   MCHC 31.9 30.0 - 36.0 g/dL   RDW 40.9 (H) 81.1 - 91.4 %   Platelets 364 150 - 400 K/uL   nRBC 0.0 0.0 - 0.2 %    Comment: Performed at [facility], [address] [place], [place] [address]  Differential     Status: None   Collection Time: 11/04/22 12:54 AM  Result Value Ref Range   Neutrophils Relative % 57 %   Neutro Abs 6.2 1.7 - 7.7 K/uL   Lymphocytes Relative 31 %   Lymphs Abs 3.3 0.7 - 4.0 K/uL   Monocytes Relative 7 %   Monocytes Absolute 0.8 0.1 - 1.0 K/uL   Eosinophils Relative 3 %   Eosinophils Absolute 0.3 0.0 - 0.5 K/uL   Basophils Relative 1 %   Basophils Absolute 0.1 0.0 - 0.1 K/uL   Immature Granulocytes 1 %   Abs Immature Granulocytes  0.05 0.00 - 0.07 K/uL    Comment: Performed at [facility], [address], [place], [place] [address]  Comprehensive metabolic panel     Status: Abnormal   Collection Time: 11/04/22 12:54 AM  Result Value Ref Range   Sodium 136 135 - 145 mmol/L   Potassium 4.0 3.5 - 5.1 mmol/L   Chloride 103 98 - 111 mmol/L   CO2 20 (L) 22 - 32 mmol/L   Glucose, Bld 213 (H) 70 - 99 mg/dL    Comment: Glucose reference range applies only to samples taken after fasting for at least 8 hours.   BUN 30 (H) 6 - 20 mg/dL   Creatinine, Ser 8.65 (H) 0.44 - 1.00 mg/dL   Calcium 8.3 (L) 8.9 - 10.3 mg/dL   Total Protein 6.1 (L) 6.5 - 8.1 g/dL   Albumin 2.9 (L) 3.5 - 5.0 g/dL   AST 13 (L) 15 - 41 U/L   ALT 14 0 - 44 U/L   Alkaline Phosphatase 71 38 - 126 U/L   Total Bilirubin 0.3 0.3 - 1.2 mg/dL   GFR, Estimated 33 (L) >60 mL/min    Comment: (NOTE) Calculated using the CKD-EPI Creatinine Equation (2021)    Anion gap 13 5 - 15    Comment: Performed at [facility], [address] [place], [place] [address]  I-stat chem 8, ED     Status: Abnormal   Collection Time: 11/04/22  1:01 AM  Result Value Ref Range   Sodium 137 135 - 145 mmol/L   Potassium 4.1 3.5 - 5.1 mmol/L   Chloride 105 98 - 111 mmol/L   BUN 29 (H) 6 - 20 mg/dL   Creatinine, Ser 6.29 (H) 0.44 - 1.00 mg/dL   Glucose, Bld 528 (H) 70 - 99 mg/dL    Comment: Glucose reference range applies only to samples taken after fasting for at least 8 hours.   Calcium, Ion 1.11 (L) 1.15 - 1.40 mmol/L   TCO2 21 (L) 22 - 32 mmol/L   Hemoglobin 11.6 (L) 12.0 - 15.0 g/dL   HCT 41.3 (L) 24.4 - 01.0 %   DG Chest Port 1 View  Result Date: 11/04/2022 CLINICAL DATA:  Shortness of breath EXAM: PORTABLE CHEST 1 VIEW COMPARISON:  10/31/2022 FINDINGS:  Cardiac shadow is enlarged but stable. Postsurgical changes are again noted. Lungs are hypoinflated but clear. No bony abnormality is noted. IMPRESSION: No active disease. Electronically Signed   By: Alcide Clever M.D.   On: 11/04/2022 03:06   MR BRAIN WO CONTRAST  Result Date: 11/04/2022 CLINICAL DATA:  Initial evaluation for neuro deficit, stroke suspected. EXAM: MRI HEAD WITHOUT CONTRAST TECHNIQUE: Multiplanar, multiecho pulse sequences of the brain and surrounding structures were obtained without intravenous contrast. COMPARISON:  Prior study from 11/03/2022 as well as earlier exams. FINDINGS: Brain: Mildly advanced cerebral atrophy for age. Patchy and confluent T2/FLAIR hyperintensity involving the periventricular and deep white matter both cerebral hemispheres, consistent with chronic small vessel ischemic disease, moderately advanced in nature. Multiple scattered remote lacunar infarcts present about the deep gray nuclei and pons. Remote left PICA territory infarct involving the inferior left cerebellum. Small remote left frontal infarct involving the left frontal operculum. No evidence for acute or subacute ischemia. Gray-white matter differentiation maintained. No other areas of chronic cortical infarction. No acute intracranial hemorrhage. Multiple scattered chronic micro hemorrhages noted clustered about the brainstem and deep gray nuclei, most characteristic of chronic poorly controlled hypertension. No mass lesion, midline shift or mass effect. No hydrocephalus or extra-axial fluid collection. Pituitary gland suprasellar region within normal limits. Vascular: Absent flow void within the left V4 segment, which could be related to slow flow and/or occlusion, stable. Major intracranial vascular flow voids are otherwise maintained. Skull and upper cervical spine: Craniocervical junction within normal limits. Bone marrow signal intensity normal. No scalp soft tissue abnormality. Sinuses/Orbits: 1 cm well-circumscribed FLAIR hyperintense lesion noted at the superior aspect of the left orbit, along the superior margin of the left lacrimal gland (series 5, image 14), indeterminate, but relatively stable from  prior exams. Globes and orbital soft tissues otherwise unremarkable. Paranasal sinuses are clear. No significant mastoid effusion. Other: None. IMPRESSION: 1. No acute intracranial abnormality. 2. Mildly advanced cerebral atrophy with moderately advanced chronic microvascular ischemic disease, with multiple remote lacunar infarcts involving the deep gray nuclei and pons. Remote left PICA territory infarct, with additional small remote left frontal infarct. 3. Multiple scattered chronic micro hemorrhages clustered about the brainstem and deep gray nuclei, most characteristic of chronic poorly controlled hypertension. 4. 1 cm well-circumscribed FLAIR hyperintense lesion at the superior aspect of the left orbit, indeterminate, but relatively stable from prior exams. Nonemergent outpatient ophthalmology referral suggested for further workup and evaluation. Electronically Signed   By: Rise Mu M.D.   On: 11/04/2022 01:36   CT HEAD CODE STROKE WO CONTRAST  Result Date: 11/03/2022 CLINICAL DATA:  Code stroke. Initial evaluation for neuro deficit, stroke. EXAM: CT HEAD WITHOUT CONTRAST TECHNIQUE: Contiguous axial images were obtained from the base of the skull through the vertex without intravenous contrast. RADIATION DOSE REDUCTION: This exam was performed according to the departmental dose-optimization program which includes automated exposure control, adjustment of the mA and/or kV according to patient size and/or use of iterative reconstruction technique. COMPARISON:  Prior CT from 05/22/2022. FINDINGS: Brain: Generalized age-related cerebral atrophy with advanced chronic microvascular ischemic disease. Scattered remote lacunar infarcts present about the deep gray nuclei. Chronic left frontal infarct. Chronic left cerebellar infarct. No acute intracranial hemorrhage. There is question of parenchymal hypodensity involving the superior right cerebellar hemisphere (series 5, image 23). Finding is not  entirely certain, and could be artifactual. No other acute large vessel territory infarct. No mass lesion or midline shift. No hydrocephalus or extra-axial fluid collection. Vascular: No  abnormal hyperdense vessel. Calcified atherosclerosis present at skull base. Skull: Scalp soft tissues within normal limits.  Calvarium intact. Sinuses/Orbits: Globes and orbital soft tissues within normal limits. Paranasal sinuses and mastoid air cells are clear. Other: None. ASPECTS [facility] Stroke Program Early CT Score) - Ganglionic level infarction (caudate, lentiform nuclei, internal capsule, insula, M1-M3 cortex): 7 - Supraganglionic infarction (M4-M6 cortex): 3 Total score (0-10 with 10 being normal): 10 IMPRESSION: 1. Question parenchymal hypodensity involving the superior right cerebellar hemisphere. While this finding could be artifactual nature, a possible evolving ischemic infarct is difficult to exclude. Correlation with dedicated MRI could be performed for further evaluation as warranted. No intracranial hemorrhage. 2. No other acute intracranial abnormality. 3. Aspects is 10. 4. Underlying atrophy with chronic small vessel ischemic disease with multiple chronic infarcts as above. These results were communicated to Dr. Derry Lory at 11:37 pm on 11/03/2022 by text page via the [facility] messaging system. Electronically Signed   By: Rise Mu M.D.   On: 11/03/2022 23:38    Pending Labs Unresulted Labs (From admission, onward)     Start     Ordered   11/04/22 0335  Type and screen  ONCE - STAT,   STAT        11/04/22 0334   11/03/22 2312  Urine rapid drug screen (hosp performed)  Once,   STAT        11/03/22 2311   11/03/22 2312  Urinalysis, Routine w reflex microscopic -Urine, Clean Catch  Once,   URGENT       Question:  Specimen Source  Answer:  Urine, Clean Catch   11/03/22 2311            Vitals/Pain Today's Vitals   11/04/22 0200 11/04/22 0209 11/04/22 0230 11/04/22 0300  BP: (!) 86/67   (!) 88/67 92/66  Pulse: 95  89 89  Resp: 14  (!) 9 (!) 7  SpO2: 98%  100% 100%  PainSc:  0-No pain      Isolation Precautions No active isolations  Medications Medications  naloxone (NARCAN) injection 0.4 mg (has no administration in time range)  lactated ringers bolus 1,000 mL (0 mLs Intravenous Stopped 11/04/22 0309)    Mobility walks with device     Focused Assessments Cardiac Assessment Handoff:    No results found for: "CKTOTAL", "CKMB", "CKMBINDEX", "TROPONINI" Lab Results  Component Value Date   DDIMER <0.27 10/06/2022   Does the Patient currently have chest pain? No    R Recommendations: See Admitting Provider Note  Report given to:   Additional Notes:

## 2022-11-04 NOTE — Telephone Encounter (Signed)
2nd attempt to reach pt regarding below, left another message for pt to call back.

## 2022-11-04 NOTE — Assessment & Plan Note (Addendum)
MRI neg for acute stroke today. 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

## 2022-11-04 NOTE — ED Notes (Signed)
Pt is now awake and alert at baseline. Able to hold a conversation. Denies pain at this time. Cardiac monitoring in place.

## 2022-11-04 NOTE — ED Notes (Signed)
0010a pt returned to the ed  iv team nurse  rerady to attempt a iv iin another side ems placed a 22 in the pts rt thumb  not running very well rapid response removed a ear plug from her ear  more alert to sound without that.  She appears drowsy unless stimulated

## 2022-11-04 NOTE — Progress Notes (Signed)
PROGRESS NOTE  Lori [name] Becton ZOX:[id number] DOB: 06-19-1970 DOA: 11/03/2022 PCP: Caesar Bookman, NP   LOS: 0 days   Brief Narrative / Interim history: No charge note, admitted overnight, H&P reviewed and agree with the assessment and plan  This is a 52 year old female with DM2, HTN, pheochromocytoma status post unilateral adrenalectomy in 2018, prior stroke with residual left-sided weakness, CAD status post CABG and ongoing pharmacy, comes into the hospital as a code stroke with right-sided weakness.  She had a similar presentation in March 2024 of lethargy in the setting of AKI, felt to be related to polypharmacy and medication buildup in the setting of worsening renal function.  She was evaluated initially as a code stroke, underwent CT and MRI which were negative for acute findings.  She has been persistently hypotensive this morning   Subjective / 24h Interval events: Seen and examined at bedside, reports that she is feeling "sleepy".  On my evaluation systolic is in the 70s.   Assesement and Plan: Principal Problem:   Acute encephalopathy Active Problems:   Nausea & vomiting   AKI (acute kidney injury) (HCC)   History of CVA with spastic paresis (cerebrovascular accident)   Hyperthyroidism   Essential hypertension   Insulin dependent type 2 diabetes mellitus (HCC)   History of cerebral infarction   Left hemiplegia (HCC)   History of CAD (coronary artery disease)   Chronic heart failure with preserved ejection fraction (HFpEF) (HCC)   Chronic anticoagulation   Hypotension   Principal problem Acute metabolic encephalopathy -perhaps secondary to symptomatic hypotension and buildup of multiple sedating medications in the setting of acute kidney injury.  Continue supportive care -Med rec pending this morning, will follow this afternoon  Active problems Hypotension -quite profound on my evaluation with systolic in the 70s.  Etiology is not entirely clear, but given  hypothermia and leukocytosis there may be an infectious component, obtain cultures and start antibiotics -Cortisol borderline low, inappropriate given the degree of hypotension and acute illness.  Obtain stim test.  Will give Decadron for today.  I see that she has had several courses of prednisone, most recent one September 2024  History of unilateral adrenalectomy -noted.   Acute kidney injury -most recent creatinine at her discharge was 1.0.  Creatinine now 2.0 last night, 1.68 this morning after fluids.  Continue to monitor and repeat in the morning after additional fluids  Hyperthyroidism-TSH unremarkable, on Tapazole at baseline.  Prior CVA -MRI negative for acute stroke, has chronic left-sided weakness.  Neurology evaluated patient in the ER  Chronic diastolic CHF-monitor closely while getting fluids  History of CAD-continue Plavix  Chronic anticoagulation-I am not sure why she is on Xarelto, I see no prior A-fib or DVT ever mentioned in prior inpatient and outpatient notes across multiple subspecialists.  No further review it appears that this may have been started during her hospitalization and any plan in June 2024.  H&P does not mention Xarelto but it is mentioned in the discharge summary.  Also, the dose appears prophylactic.  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  Polypharmacy -med rec pending  Scheduled Meds:  clopidogrel  75 mg Oral Daily   [START ON 11/05/2022] cosyntropin  0.25 mg Intravenous Once   dexamethasone (DECADRON) injection  4 mg Intravenous Q12H   insulin aspart  0-9 Units Subcutaneous Q4H   insulin glargine-yfgn  20 Units Subcutaneous QHS   Continuous Infusions:  ceFEPime (MAXIPIME) IV Stopped (11/04/22 8119)  lactated ringers 125 mL/hr at 11/04/22 0851   vancomycin     [START ON 11/05/2022] vancomycin     PRN Meds:.acetaminophen **OR** acetaminophen, ondansetron **OR** ondansetron (ZOFRAN) IV  Current Outpatient  Medications  Medication Instructions   albuterol (PROVENTIL) 2.5 mg, Nebulization, Every 6 hours PRN   albuterol (VENTOLIN HFA) 108 (90 Base) MCG/ACT inhaler INHALE 2 PUFFS BY MOUTH EVERY 6 HOURS AS NEEDED FOR WHEEZING AND FOR SHORTNESS OF BREATH   atorvastatin (LIPITOR) 80 mg, Oral, Daily   baclofen (LIORESAL) 10 mg, Oral, 3 times daily, X 1 week, then 10 mg TID-for spasticity- can increase  the dose if you call me to discuss   blood glucose meter kit and supplies 1 each, Other, As directed, Dispense based on patient and insurance preference. Use up to four times daily as directed. (FOR ICD-10 E10.9, E11.9).   budesonide (PULMICORT) 0.25 mg, Nebulization, 2 times daily   clopidogrel (PLAVIX) 75 mg, Oral, Daily   Continuous Glucose Receiver (FREESTYLE LIBRE 2 READER) DEVI 1 Device, Does not apply, Daily, E11.69   Continuous Glucose Sensor (FREESTYLE LIBRE 14 DAY SENSOR) MISC 1 each, Does not apply, As needed, DX: E11.69   cyclobenzaprine (FLEXERIL) 5 mg, Oral, 3 times daily PRN   diclofenac Sodium (VOLTAREN) 4 g, Topical, 4 times daily   dicyclomine (BENTYL) 20 mg, Oral, 2 times daily   escitalopram (LEXAPRO) 20 mg, Oral, Every morning   ezetimibe (ZETIA) 10 mg, Oral, Daily   gabapentin (NEURONTIN) 100 mg, Oral, 3 times daily   insulin aspart (NOVOLOG) 15 Units, Subcutaneous, 3 times daily with meals   Insulin Pen Needle 32G X 4 MM MISC Use to inject insulin 4 times daily as directed.   KLAYESTA powder 1 Application, Topical, 2 times daily, Affected areas on breast fold, groin, and sacral areas.   LANTUS SOLOSTAR 100 UNIT/ML Solostar Pen INJECT 40 UNITS SUBCUTANEOUSLY AT BEDTIME   LORazepam (ATIVAN) 0.5 mg, Oral, Daily at bedtime   methimazole (TAPAZOLE) 5 mg, Oral, Every morning   omeprazole (PRILOSEC) 20 mg, Oral, Daily   ondansetron (ZOFRAN) 4 mg, Oral, Every 6 hours   Ozempic (0.25 or 0.5 MG/DOSE) 0.5 mg, Subcutaneous, Weekly   polyethylene glycol powder (GLYCOLAX/MIRALAX) 17 g, Oral,  Daily, Hold for loose stool   ranolazine (RANEXA) 500 mg, Oral, 2 times daily   rivaroxaban (XARELTO) 10 mg, Oral, Daily   senna-docusate (SENOKOT-S) 8.6-50 MG tablet 1 tablet, Oral, Daily   tamsulosin (FLOMAX) 0.4 mg, Oral, Daily at bedtime   topiramate (TOPAMAX) 25 mg, Oral, 2 times daily   zinc oxide (BALMEX) 11.3 % CREA cream 1 Application, Topical, 2 times daily    Diet Orders (From admission, onward)     Start     Ordered   11/04/22 0454  Diet clear liquid Room service appropriate? Yes; Fluid consistency: Thin  Diet effective now       Question Answer Comment  Room service appropriate? Yes   Fluid consistency: Thin      11/04/22 0454            DVT prophylaxis:    Lab Results  Component Value Date   PLT 353 11/04/2022      Code Status: Full Code  Family Communication: no family at bedside   Status is: Observation The patient will require care spanning > 2 midnights and should be moved to inpatient because: severity of illness  Level of care: Progressive  Consultants:  Neurology   Objective: Vitals:   11/04/22 0800 11/04/22  0830 11/04/22 0852 11/04/22 0900  BP: (!) 104/59 124/78 123/79 115/73  Pulse: 92 98 89 90  Resp: 17 12 15 10   Temp:      TempSrc:      SpO2: 100% 100% 100% 100%  Height:        Intake/Output Summary (Last 24 hours) at 11/04/2022 0936 Last data filed at 11/04/2022 0850 Gross per 24 hour  Intake 1100 ml  Output --  Net 1100 ml   Wt Readings from Last 3 Encounters:  11/01/22 90.7 kg  10/31/22 88.5 kg  10/17/22 96 kg    Examination:  Constitutional: NAD Eyes: no scleral icterus ENMT: Mucous membranes are moist.  Neck: normal, supple Respiratory: clear to auscultation bilaterally, no wheezing, no crackles. Normal respiratory effort. No accessory muscle use.  Cardiovascular: Regular rate and rhythm, no murmurs / rubs / gallops. No LE edema.  Abdomen: non distended, no tenderness. Bowel sounds positive.  Musculoskeletal:  no clubbing / cyanosis.  Skin: no rashes Neurologic: left sided weakness. Slurred speech present   Data Reviewed: I have independently reviewed following labs and imaging studies   CBC Recent Labs  Lab 10/31/22 1315 11/04/22 0054 11/04/22 0101 11/04/22 0456  WBC 8.9 10.7*  --  11.2*  HGB 13.4 10.7* 11.6* 10.3*  HCT 41.1 33.5* 34.0* 32.7*  PLT 382 364  --  353  MCV 79.5* 81.3  --  81.5  MCH 25.9* 26.0  --  25.7*  MCHC 32.6 31.9  --  31.5  RDW 17.5* 17.3*  --  17.3*  LYMPHSABS 1.8 3.3  --   --   MONOABS 0.5 0.8  --   --   EOSABS 0.1 0.3  --   --   BASOSABS 0.0 0.1  --   --     Recent Labs  Lab 10/31/22 1315 11/01/22 1609 11/04/22 0054 11/04/22 0101 11/04/22 0456 11/04/22 0910  NA 135 138 136 137 137  --   K 4.3 4.4 4.0 4.1 3.9  --   CL 99 101 103 105 104  --   CO2 23 24 20*  --  20*  --   GLUCOSE 248* 217* 213* 214* 184*  --   BUN 39* 28* 30* 29* 28*  --   CREATININE 1.73* 1.01 1.83* 2.00* 1.68*  --   CALCIUM 8.8* 9.9 8.3*  --  8.4*  --   AST 16  --  13*  --   --   --   ALT 21  --  14  --   --   --   ALKPHOS 90  --  71  --   --   --   BILITOT 0.5  --  0.3  --   --   --   ALBUMIN 3.5  --  2.9*  --   --   --   LATICACIDVEN  --   --   --   --   --  2.7*  INR  --   --  1.1  --   --   --   TSH  --   --   --   --  0.547  --     ------------------------------------------------------------------------------------------------------------------ No results for input(s): "CHOL", "HDL", "LDLCALC", "TRIG", "CHOLHDL", "LDLDIRECT" in the last 72 hours.  Lab Results  Component Value Date   HGBA1C 9.8 (H) 08/27/2022   ------------------------------------------------------------------------------------------------------------------ Recent Labs    11/04/22 0456  TSH 0.547    Cardiac Enzymes No results for input(s): "CKMB", "TROPONINI", "MYOGLOBIN"  in the last 168 hours.  Invalid input(s):  "CK" ------------------------------------------------------------------------------------------------------------------    Component Value Date/Time   BNP 19.5 09/06/2022 2242    CBG: Recent Labs  Lab 10/31/22 1153 11/03/22 2317 11/04/22 0523 11/04/22 0734  GLUCAP 243* 197* 164* 151*    No results found for this or any previous visit (from the past 240 hour(s)).   Radiology Studies: DG Chest Port 1 View  Result Date: 11/04/2022 CLINICAL DATA:  Shortness of breath EXAM: PORTABLE CHEST 1 VIEW COMPARISON:  10/31/2022 FINDINGS: Cardiac shadow is enlarged but stable. Postsurgical changes are again noted. Lungs are hypoinflated but clear. No bony abnormality is noted. IMPRESSION: No active disease. Electronically Signed   By: Alcide Clever M.D.   On: 11/04/2022 03:06   MR BRAIN WO CONTRAST  Result Date: 11/04/2022 CLINICAL DATA:  Initial evaluation for neuro deficit, stroke suspected. EXAM: MRI HEAD WITHOUT CONTRAST TECHNIQUE: Multiplanar, multiecho pulse sequences of the brain and surrounding structures were obtained without intravenous contrast. COMPARISON:  Prior study from 11/03/2022 as well as earlier exams. FINDINGS: Brain: Mildly advanced cerebral atrophy for age. Patchy and confluent T2/FLAIR hyperintensity involving the periventricular and deep white matter both cerebral hemispheres, consistent with chronic small vessel ischemic disease, moderately advanced in nature. Multiple scattered remote lacunar infarcts present about the deep gray nuclei and pons. Remote left PICA territory infarct involving the inferior left cerebellum. Small remote left frontal infarct involving the left frontal operculum. No evidence for acute or subacute ischemia. Gray-white matter differentiation maintained. No other areas of chronic cortical infarction. No acute intracranial hemorrhage. Multiple scattered chronic micro hemorrhages noted clustered about the brainstem and deep gray nuclei, most characteristic  of chronic poorly controlled hypertension. No mass lesion, midline shift or mass effect. No hydrocephalus or extra-axial fluid collection. Pituitary gland suprasellar region within normal limits. Vascular: Absent flow void within the left V4 segment, which could be related to slow flow and/or occlusion, stable. Major intracranial vascular flow voids are otherwise maintained. Skull and upper cervical spine: Craniocervical junction within normal limits. Bone marrow signal intensity normal. No scalp soft tissue abnormality. Sinuses/Orbits: 1 cm well-circumscribed FLAIR hyperintense lesion noted at the superior aspect of the left orbit, along the superior margin of the left lacrimal gland (series 5, image 14), indeterminate, but relatively stable from prior exams. Globes and orbital soft tissues otherwise unremarkable. Paranasal sinuses are clear. No significant mastoid effusion. Other: None. IMPRESSION: 1. No acute intracranial abnormality. 2. Mildly advanced cerebral atrophy with moderately advanced chronic microvascular ischemic disease, with multiple remote lacunar infarcts involving the deep gray nuclei and pons. Remote left PICA territory infarct, with additional small remote left frontal infarct. 3. Multiple scattered chronic micro hemorrhages clustered about the brainstem and deep gray nuclei, most characteristic of chronic poorly controlled hypertension. 4. 1 cm well-circumscribed FLAIR hyperintense lesion at the superior aspect of the left orbit, indeterminate, but relatively stable from prior exams. Nonemergent outpatient ophthalmology referral suggested for further workup and evaluation. Electronically Signed   By: Rise Mu M.D.   On: 11/04/2022 01:36   CT HEAD CODE STROKE WO CONTRAST  Result Date: 11/03/2022 CLINICAL DATA:  Code stroke. Initial evaluation for neuro deficit, stroke. EXAM: CT HEAD WITHOUT CONTRAST TECHNIQUE: Contiguous axial images were obtained from the base of the skull  through the vertex without intravenous contrast. RADIATION DOSE REDUCTION: This exam was performed according to the departmental dose-optimization program which includes automated exposure control, adjustment of the mA and/or kV according to patient size and/or use of  iterative reconstruction technique. COMPARISON:  Prior CT from 05/22/2022. FINDINGS: Brain: Generalized age-related cerebral atrophy with advanced chronic microvascular ischemic disease. Scattered remote lacunar infarcts present about the deep gray nuclei. Chronic left frontal infarct. Chronic left cerebellar infarct. No acute intracranial hemorrhage. There is question of parenchymal hypodensity involving the superior right cerebellar hemisphere (series 5, image 23). Finding is not entirely certain, and could be artifactual. No other acute large vessel territory infarct. No mass lesion or midline shift. No hydrocephalus or extra-axial fluid collection. Vascular: No abnormal hyperdense vessel. Calcified atherosclerosis present at skull base. Skull: Scalp soft tissues within normal limits.  Calvarium intact. Sinuses/Orbits: Globes and orbital soft tissues within normal limits. Paranasal sinuses and mastoid air cells are clear. Other: None. ASPECTS [facility] Stroke Program Early CT Score) - Ganglionic level infarction (caudate, lentiform nuclei, internal capsule, insula, M1-M3 cortex): 7 - Supraganglionic infarction (M4-M6 cortex): 3 Total score (0-10 with 10 being normal): 10 IMPRESSION: 1. Question parenchymal hypodensity involving the superior right cerebellar hemisphere. While this finding could be artifactual nature, a possible evolving ischemic infarct is difficult to exclude. Correlation with dedicated MRI could be performed for further evaluation as warranted. No intracranial hemorrhage. 2. No other acute intracranial abnormality. 3. Aspects is 10. 4. Underlying atrophy with chronic small vessel ischemic disease with multiple chronic infarcts as  above. These results were communicated to Dr. Derry Lory at 11:37 pm on 11/03/2022 by text page via the [facility] messaging system. Electronically Signed   By: Rise Mu M.D.   On: 11/03/2022 23:38     Pamella Pert, MD, PhD [facility]  Between 7 am - 7 pm I am available, please contact me via Amion (for emergencies) or Securechat (non urgent messages)  Between 7 pm - 7 am I am not available, please contact night coverage MD/APP via Amion

## 2022-11-04 NOTE — ED Notes (Signed)
Pt called RN and states she does not want to be alone in the room. Pt states she hears wheezing when she breathes. No hx of asthma. Pt O2 is at 100% on RA with RR 16-20 noted on the monitor. Pt states, "I don't feel short of breath. I just have wheezing." Airway intact. Able to finish sentences with no distress. Denies pain at this time. Cardiac monitoring in place. Will continue to monitor.

## 2022-11-04 NOTE — ED Notes (Signed)
Patient will awake to voice . Dr. Blinda Leatherwood at bedside aware of VS. Patient c/o headache.

## 2022-11-04 NOTE — Assessment & Plan Note (Addendum)
Suspect secondary to symptomatic hypotension + build up of multiple sedating CNS meds in setting of AKI. Med rec pending, but Hold CNS sedating meds: baclofen, gabapentin, flexeril, topamax IVF for hypotension and hold BP meds Note, very similar presentation (AMS in setting of AKI on sedating meds) in March of this year.

## 2022-11-04 NOTE — ED Notes (Signed)
Pt starts complaining of shortness of breath. States, "I feel like my throat is closing since I'm allergic to betadine." RN told pt that she had the betadine last night when they in and out cath her. Pt has no swelling to tongue and airway appears intact. 100% on RA. Pt then started calming down and is able to swallow okay. No drooling noted. Will continue to monitor. Reports she has hx of anxiety.

## 2022-11-04 NOTE — ED Notes (Signed)
Pt refused blood cultures. MD is aware.

## 2022-11-04 NOTE — ED Notes (Signed)
pt reports allergy to adhesives. pt states she is having a reaction to the electrodes. pt has one small red spot on her chest where she has been itching . pt also reports ichyness around her hairline. pt is requesting benedryl .

## 2022-11-04 NOTE — Consult Note (Signed)
NEUROLOGY CONSULTATION NOTE   Date of service: November 04, 2022 Patient Name: Lori [name] MRN:  [id number] DOB:  Dec 26, 1970 Reason for consult: "R sided weakness, code stroke" Requesting Provider: [name], * _ _ _   _ __   _ __ _ _  __ __   _ __   __ _  History of Present Illness  [name] is a 52 y.o. female with PMH significant for DM2, HTN, prior strokes with residual left sided weakness, pheochromocytoma, hypothyroidism, CABG who presents with 3 to 4-day history of nausea/vomiting with poor p.o. intake.  In the evening around 1800 on 11/03/2022, more lethargic and tired with some slurring of her speech and concern for a right facial droop, right-sided weakness for which she was brought into the ED.  When EMS got on scene, she was noted to be hypotensive to systolic in 70s.  Upon their initial evaluation, they noted that patient was tired, lethargic.  They had concern that she had a right facial droop and right-sided weakness and her eyes would not cross midline to the right side.  She was a difficult access but they were able to get an IV in her thumb and give her some fluids.  As a gave her fluid, she became more responsive and at that time she was able to move her right side and answer questions and follow commands.  LKW: 1800 on 11/03/2022. mRS: 3 tNKASE: Not offered as she is outside of window and she is on Xarelto.  Overall suspicion for this being stroke is pretty low. Thrombectomy: Not offered due to low suspicion for stroke, no new deficits and her exam is consistent with her chronic known left-sided weakness. NIHSS components Score: Comment  1a Level of Conscious 0[]  1[]  2[]  3[]      1b LOC Questions 0[]  1[]  2[]       1c LOC Commands 0[]  1[]  2[]       2 Best Gaze 0[]  1[]  2[]       3 Visual 0[]  1[]  2[]  3[]      4 Facial Palsy 0[]  1[]  2[]  3[]      5a Motor Arm - left 0[]  1[]  2[]  3[]  4[]  UN[]    5b Motor Arm - Right 0[]  1[]  2[]  3[]  4[]  UN[]    6a Motor Leg -  Left 0[]  1[]  2[]  3[]  4[]  UN[]    6b Motor Leg - Right 0[]  1[]  2[]  3[]  4[]  UN[]    7 Limb Ataxia 0[]  1[]  2[]  3[]  UN[]     8 Sensory 0[]  1[]  2[]  UN[]      9 Best Language 0[]  1[]  2[]  3[]      10 Dysarthria 0[]  1[]  2[]  UN[]      11 Extinct. and Inattention 0[]  1[]  2[]       TOTAL:        ROS   Constitutional Denies weight loss, fever and chills.  HEENT Denies changes in vision and hearing.   Respiratory Denies SOB and cough.   CV Denies palpitations and CP   GI + abdominal pain, nausea, vomiting and diarrhea.   GU Denies dysuria and urinary frequency.   MSK Denies myalgia and joint pain.   Skin Denies rash and pruritus.   Neurological + headache and syncope.   Psychiatric Denies recent changes in mood. Denies anxiety and depression.    Past History   Past Medical History:  Diagnosis Date   CAD (coronary artery disease)    Depression    Diabetes mellitus without complication (HCC)    History  of CT scan    History of mammogram    History of MRI    Hypertension    Pheochromocytoma    Stroke [facility])    Thyroid disease    Past Surgical History:  Procedure Laterality Date   ABDOMINAL HYSTERECTOMY     CESAREAN SECTION     3   CORONARY ARTERY BYPASS GRAFT     Right adrenal gland removal for pheochromocytoma Right    Family History  Problem Relation Age of Onset   Hypertension Mother    Diabetes Mother    Atrial fibrillation Mother    Hypertension Father    Heart attack Father    Dementia Father    Diabetes Brother    Brain cancer Brother    Asthma Daughter    Sickle cell anemia Son    Atrial fibrillation Maternal Uncle    Social History   Socioeconomic History   Marital status: Married    Spouse name: Not on file   Number of children: Not on file   Years of education: Not on file   Highest education level: Not on file  Occupational History   Not on file  Tobacco Use   Smoking status: Never   Smokeless tobacco: Never  Vaping Use   Vaping status: Never Used   Substance and Sexual Activity   Alcohol use: Not Currently   Drug use: Never   Sexual activity: Not on file  Other Topics Concern   Not on file  Social History Narrative   Tobacco use, amount per day now: Never   Past tobacco use, amount per day: Never   How many years did you use tobacco: Never   Alcohol use (drinks per week): Not Currently.   Diet: Eat out a lot.    Do you drink/eat things with caffeine: Sweet Tea   Marital status:   Married                               What year were you married? 2000   Do you live in a house, apartment, assisted living, condo, trailer, etc.? House   Is it one or more stories? 1   How many persons live in your home? 2   Do you have pets in your home?( please list) No   Highest Level of education completed? Bachelors Degree.   Current or past profession: Solicitor, [employer]   Do you exercise?   No                               Type and how often?   Do you have a living will? No   Do you have a DNR form?       No                            If not, do you want to discuss one?   Do you have signed POA/HPOA forms?   No                     If so, please bring to you appointment      Do you have any difficulty bathing or dressing yourself? Yes   Do you have any difficulty preparing food or eating? Yes   Do you have any difficulty  managing your medications? Yes   Do you have any difficulty managing your finances? No   Do you have any difficulty affording your medications? Yes   Social Determinants of Health   Financial Resource Strain: Not on file  Food Insecurity: No Food Insecurity (10/12/2022)   Hunger Vital Sign    Worried About Running Out of Food in the Last Year: Never true    Ran Out of Food in the Last Year: Never true  Transportation Needs: No Transportation Needs (10/12/2022)   PRAPARE - Administrator, Civil Service (Medical): No    Lack of Transportation (Non-Medical): No   Physical Activity: Not on file  Stress: Not on file  Social Connections: Not on file   Allergies  Allergen Reactions   Contrast Media [Iodinated Contrast Media] Anaphylaxis and Swelling   Penicillins Swelling    Mouth swells up and eyes swollen shut   Sumatriptan Anaphylaxis   Vancomycin Other (See Comments)    Red Man's Syndrome   Aspirin Swelling   Ciprofloxacin     Feel nausea/vomitting   Morphine Swelling    Medications  (Not in a hospital admission)    Vitals   There were no vitals filed for this visit.   There is no height or weight on file to calculate BMI.  Physical Exam   General: Laying comfortably in bed; in no acute distress.  HENT: Normal oropharynx and mucosa. Normal external appearance of ears and nose.  Neck: Supple, no pain or tenderness  CV: No JVD. No peripheral edema.  Pulmonary: Symmetric Chest rise. Normal respiratory effort.  Abdomen: Soft to touch, non-tender.  Ext: No cyanosis, edema, or deformity  Skin: No rash. Normal palpation of skin.   Musculoskeletal: Normal digits and nails by inspection. No clubbing.   Neurologic Examination  Mental status/Cognition: drowsy, oriented to self, place, month and year, poor attention. Speech/language: dysarthric speech, fluent, comprehension intact, object naming intact, repetition intact. Cranial nerves:   CN II Pupils equal and reactive to light, no VF deficits    CN III,IV,VI EOM intact, no gaze preference or deviation, no nystagmus    CN V normal sensation in V1, V2, and V3 segments bilaterally    CN VII no asymmetry, no nasolabial fold flattening    CN VIII normal hearing to speech    CN IX & X normal palatal elevation, no uvular deviation    CN XI 5/5 head turn and 5/5 shoulder shrug bilaterally    CN XII midline tongue protrusion    Motor:  Muscle bulk: poor, tone slightly increased on the left Antigravity movements noted in BL upper extremities. Left hand grip is 4/5. LLLE: 2/5, RLE:  4+/5  Sensation:  Light touch Decreased to touch on the left.   Pin prick    Temperature    Vibration   Proprioception    Coordination/Complex Motor:  - Finger to Nose intact BL - Heel to shin unable to do - Rapid alternating movement are slowed throughout - Gait: deferred for patient safety.  Labs   CBC:  Recent Labs  Lab 10/31/22 1315 11/04/22 0054 11/04/22 0101  WBC 8.9 10.7*  --   NEUTROABS 6.4 6.2  --   HGB 13.4 10.7* 11.6*  HCT 41.1 33.5* 34.0*  MCV 79.5* 81.3  --   PLT 382 364  --     Basic Metabolic Panel:  Lab Results  Component Value Date   NA 137 11/04/2022   K 4.1 11/04/2022  CO2 20 (L) 11/04/2022   GLUCOSE 214 (H) 11/04/2022   BUN 29 (H) 11/04/2022   CREATININE 2.00 (H) 11/04/2022   CALCIUM 8.3 (L) 11/04/2022   GFRNONAA 33 (L) 11/04/2022   Lipid Panel:  Lab Results  Component Value Date   LDLCALC 66 10/07/2022   HgbA1c:  Lab Results  Component Value Date   HGBA1C 9.8 (H) 08/27/2022   Urine Drug Screen:     Component Value Date/Time   LABOPIA NONE DETECTED 04/09/2021 0938   COCAINSCRNUR NONE DETECTED 04/09/2021 0938   COCAINSCRNUR NONE DETECTED 01/19/2021 1130   LABBENZ NONE DETECTED 04/09/2021 0938   AMPHETMU NONE DETECTED 04/09/2021 0938   THCU NONE DETECTED 04/09/2021 0938   LABBARB POSITIVE (A) 04/09/2021 0938    Alcohol Level     Component Value Date/Time   ETH <10 11/04/2022 0054   INR  Lab Results  Component Value Date   INR 1.1 11/04/2022   APTT  Lab Results  Component Value Date   APTT 33 11/04/2022     CT Head without contrast(Personally reviewed): CTH was negative for a large hypodensity concerning for a large territory infarct or hyperdensity concerning for an ICH  MRI Brain(Personally reviewed): No acute stroke  Impression   Chelene Sferrazza is a 52 y.o. female with PMH significant for DM2, HTN, prior strokes with residual left sided weakness, pheochromocytoma, hypothyroidism, CABG who presents with  3 to 4-day history of nausea/vomiting with poor p.o. intake.  In the evening around 1800 on 11/03/2022, more lethargic and tired with some slurring of her speech and concern for a right facial droop, right-sided weakness for which she was brought into the ED.  Symptoms resolved with IV fluids and MRI Brain negative for acute stroke. Suspect she had symptomatic hypotension rather than acute stroke/TIA.  Recommendations  - no further inpatient neurologic workup. We will signoff. ______________________________________________________________________   Thank you for the opportunity to take part in the care of this patient. If you have any further questions, please contact the neurology consultation attending.  Signed,  Erick Blinks [facility] _ _ _   _ __   _ __ _ _  __ __   _ __   __ _
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
## 2022-11-04 NOTE — Assessment & Plan Note (Signed)
On tapazole at baseline Checking TSH

## 2022-11-04 NOTE — Assessment & Plan Note (Addendum)
Suspect pre-renal / ATN given presented with severe hypotension (BP 70s with EMS, 80s in ED), in this patient with chronic HYPERtension.  Also h/o N/V/D for past couple of days. IVF Strict intake and output Follow BMPs Hold home BP meds given hypotension Hold any nephro toxic meds.

## 2022-11-04 NOTE — Assessment & Plan Note (Signed)
Hold all home BP meds given presentation to ED with hypotension this evening.

## 2022-11-04 NOTE — Progress Notes (Signed)
Inpatient Rehab Admissions Coordinator:   Per therapy recommendations, patient was screened for CIR candidacy by Clemens Catholic, MS, CCC-SLP. At this time, Pt. Remains observation status and does not appear to demonstrate medical necessity to justify in hospital rehabilitation/CIR. I will not pursue a rehab consult for this Pt.   Recommend other rehab venues to be pursued.  Please contact me with any questions.  Clemens Catholic, [place], [place] Admissions Coordinator  [phone] ([place]) [phone] (office)

## 2022-11-04 NOTE — ED Notes (Signed)
Pt repositioned by this RN at this time. Warm blankets given to pt. Pt continues to report different symptoms every time RN leaves the room. RN called pt husband who states he is coming soon to visit pt. Will continue to monitor.

## 2022-11-04 NOTE — Code Documentation (Addendum)
Responded to Code Stroke called at 2247 for R sided weakness, R facial droop, L gaze, and aphasia, LSN-1800. CBG-197, NIH-15, CT head negative for acute changes. D/t pt's contrast allergy, pt was transported to MRI at 2332. MRI negative for stroke. TNK not given-pt on xarelto, not a stroke. Code Stroke cancelled at 0054.

## 2022-11-04 NOTE — Assessment & Plan Note (Signed)
Watch for development of volume overload with IVF treatment.

## 2022-11-04 NOTE — Assessment & Plan Note (Addendum)
Looks like she got started on xarelto at time of DC from hospital stay 5/30-6/5 of this year.  Unfortunately, its not quite clear why? Probably should clarify if she needs to be on xarelto and why, dont see anything about known A.Fib etc.

## 2022-11-04 NOTE — H&P (Signed)
History and Physical    Patient: Lori [name] OZH:[id number] DOB: Nov 12, 1970 DOA: 11/03/2022 DOS: the patient was seen and examined on 11/04/2022 PCP: Ngetich, Donalee Citrin, NP  Patient coming from: Home  Chief Complaint: AMS  HPI: Lori [name] is a 52 y.o. female with medical history significant of DM2, HTN, pheochromocytoma s/p adrenalectomy, stroke with residual L sided weakness, CAD s/p CABG and ongoing polypharmacy.  Pt on multiple sedating medications.  H/o presentation in L[place] of this year with lethargy in setting of AKI.  Ultimately felt to have build up of sedating meds in system due to AKI.  Today pt presents as code stroke with reported R sided weakness.  Pt has had 3-4 day h/o N/V and poor PO intake.  This evening she was more lethargic and tired with some slurring of her speech and concern for R facial droop and R sided weakness.  Eyes wouldn't cross midline to R side.  EMS called and pt in to ED as code stroke.  EMS noted that initial BP systolic in the 70s.  They gave IVF which improved BP and her neuro symptoms / mental status.   Review of Systems: As mentioned in the history of present illness. All other systems reviewed and are negative. Past Medical History:  Diagnosis Date   CAD (coronary artery disease)    Depression    Diabetes mellitus without complication (HCC)    History of CT scan    History of mammogram    History of MRI    Hypertension    Pheochromocytoma    Stroke [facility])    Thyroid disease    Past Surgical History:  Procedure Laterality Date   ABDOMINAL HYSTERECTOMY     CESAREAN SECTION     3   CORONARY ARTERY BYPASS GRAFT     Right adrenal gland removal for pheochromocytoma Right    Social History:  reports that she has never smoked. She has never used smokeless tobacco. She reports that she does not currently use alcohol. She reports that she does not use drugs.  Allergies  Allergen Reactions   Contrast Media [Iodinated Contrast  Media] Anaphylaxis and Swelling   Penicillins Swelling    Mouth swells up and eyes swollen shut   Sumatriptan Anaphylaxis   Vancomycin Other (See Comments)    Red Man's Syndrome   Aspirin Swelling   Ciprofloxacin     Feel nausea/vomitting   Morphine Swelling    Family History  Problem Relation Age of Onset   Hypertension Mother    Diabetes Mother    Atrial fibrillation Mother    Hypertension Father    Heart attack Father    Dementia Father    Diabetes Brother    Brain cancer Brother    Asthma Daughter    Sickle cell anemia Son    Atrial fibrillation Maternal Uncle     Prior to Admission medications   Medication Sig Start Date End Date Taking? Authorizing Provider  albuterol (PROVENTIL) (2.5 MG/3ML) 0.083% nebulizer solution Take 3 mLs (2.5 mg total) by nebulization every 6 (six) hours as needed for wheezing or shortness of breath. 05/21/22   Ngetich, Dinah C, NP  albuterol (VENTOLIN HFA) 108 (90 Base) MCG/ACT inhaler INHALE 2 PUFFS BY MOUTH EVERY 6 HOURS AS NEEDED FOR WHEEZING AND FOR SHORTNESS OF BREATH 10/17/22   Ngetich, Dinah C, NP  atorvastatin (LIPITOR) 80 MG tablet Take 1 tablet (80 mg total) by mouth daily. 09/02/22   Ngetich, Donalee Citrin, NP  baclofen (LIORESAL) 10 MG tablet Take 1 tablet (10 mg total) by mouth 3 (three) times daily. X 1 week, then 10 mg TID-for spasticity- can increase  the dose if you call me to discuss 07/10/22   Maurilio Lovely D, DO  blood glucose meter kit and supplies 1 each by Other route as directed. Dispense based on patient and insurance preference. Use up to four times daily as directed. (FOR ICD-10 E10.9, E11.9). 06/24/22   Ngetich, Dinah C, NP  budesonide (PULMICORT) 0.25 MG/2ML nebulizer solution Take 2 mLs (0.25 mg total) by nebulization 2 (two) times daily. 05/21/22   Ngetich, Dinah C, NP  clopidogrel (PLAVIX) 75 MG tablet Take 1 tablet (75 mg total) by mouth daily. 09/02/22   Ngetich, Donalee Citrin, NP  Continuous Glucose Receiver (FREESTYLE LIBRE 2 READER)  DEVI 1 Device by Does not apply route daily. E11.69 11/01/22   Ngetich, Dinah C, NP  Continuous Glucose Sensor (FREESTYLE LIBRE 14 DAY SENSOR) MISC 1 each by Does not apply route as needed. DX: E11.69 08/06/22   Sharon Seller, NP  cyclobenzaprine (FLEXERIL) 5 MG tablet Take 1 tablet (5 mg total) by mouth 3 (three) times daily as needed for muscle spasms. 07/10/22   Sherryll Burger, Pratik D, DO  diclofenac Sodium (VOLTAREN) 1 % GEL Apply 4 g topically 4 (four) times daily. 06/27/22   Ngetich, Dinah C, NP  dicyclomine (BENTYL) 20 MG tablet Take 1 tablet (20 mg total) by mouth 2 (two) times daily. 10/31/22   Elayne Snare K, DO  escitalopram (LEXAPRO) 20 MG tablet Take 1 tablet (20 mg total) by mouth every morning. 09/02/22   Ngetich, Dinah C, NP  ezetimibe (ZETIA) 10 MG tablet Take 1 tablet (10 mg total) by mouth daily. 09/02/22   Ngetich, Dinah C, NP  gabapentin (NEURONTIN) 100 MG capsule Take 1 capsule (100 mg total) by mouth 3 (three) times daily. 09/02/22   Ngetich, Dinah C, NP  insulin aspart (NOVOLOG) 100 UNIT/ML injection Inject 15 Units into the skin 3 (three) times daily with meals. 11/01/22   Ngetich, Dinah C, NP  Insulin Pen Needle 32G X 4 MM MISC Use to inject insulin 4 times daily as directed. 05/21/22   Ngetich, Donalee Citrin, NP  KLAYESTA powder Apply 1 Application topically 2 (two) times daily. Affected areas on breast fold, groin, and sacral areas. 05/16/22   [provider]  LANTUS SOLOSTAR 100 UNIT/ML Solostar Pen INJECT 40 UNITS SUBCUTANEOUSLY AT BEDTIME Patient taking differently: Inject 40 Units into the skin at bedtime. 08/21/22   Ngetich, Dinah C, NP  LORazepam (ATIVAN) 0.5 MG tablet Take 1 tablet (0.5 mg total) by mouth at bedtime. 08/27/22   Ngetich, Dinah C, NP  methimazole (TAPAZOLE) 5 MG tablet Take 1 tablet (5 mg total) by mouth every morning. 09/16/22   Ngetich, Dinah C, NP  omeprazole (PRILOSEC) 20 MG capsule Take 1 capsule (20 mg total) by mouth daily. 09/02/22   Ngetich, Dinah C, NP   ondansetron (ZOFRAN) 4 MG tablet Take 1 tablet (4 mg total) by mouth every 6 (six) hours. 10/31/22   Elayne Snare K, DO  polyethylene glycol powder (GLYCOLAX/MIRALAX) 17 GM/SCOOP powder Take 17 g by mouth daily. Hold for loose stool 06/27/22   Ngetich, Dinah C, NP  ranolazine (RANEXA) 500 MG 12 hr tablet Take 1 tablet (500 mg total) by mouth 2 (two) times daily. 10/09/22 01/07/23  [place], Alvira Philips, DO  rivaroxaban (XARELTO) 10 MG TABS tablet Take 1 tablet (10 mg total) by  mouth daily. 09/02/22   Ngetich, Dinah C, NP  Semaglutide,0.25 or 0.5MG /DOS, (OZEMPIC, 0.25 OR 0.5 MG/DOSE,) 2 MG/1.5ML SOPN Inject 0.5 mg into the skin once a week. 11/01/22   Ngetich, Dinah C, NP  senna-docusate (SENOKOT-S) 8.6-50 MG tablet Take 1 tablet by mouth daily. 10/31/22   Elayne Snare K, DO  tamsulosin (FLOMAX) 0.4 MG CAPS capsule Take 1 capsule (0.4 mg total) by mouth at bedtime. 09/02/22   Ngetich, Dinah C, NP  topiramate (TOPAMAX) 25 MG tablet Take 1 tablet (25 mg total) by mouth 2 (two) times daily. 07/10/22   Sherryll Burger, Pratik D, DO  zinc oxide (BALMEX) 11.3 % CREA cream Apply 1 Application topically 2 (two) times daily. 06/27/22   Ngetich, Donalee Citrin, NP    Physical Exam: Vitals:   11/04/22 0230 11/04/22 0300 11/04/22 0330 11/04/22 0433  BP: (!) 88/67 92/66 (!) 86/62 (!) 86/54  Pulse: 89 89 94 94  Resp: (!) 9 (!) 7 (!) 7 18  Temp:    (!) 96.1 F (35.6 C)  TempSrc:    Temporal  SpO2: 100% 100% 100% 100%  Height:       Constitutional: NAD, calm, comfortable, drowsy Respiratory: clear to auscultation bilaterally, no wheezing, no crackles. Normal respiratory effort. No accessory muscle use.  Cardiovascular: Regular rate and rhythm, no murmurs / rubs / gallops. No extremity edema. 2+ pedal pulses. No carotid bruits.  Abdomen: no tenderness, no masses palpated. No hepatosplenomegaly. Bowel sounds positive.  Neurologic: L hand 4/5, LLE 2/5, RLE 4/5.  Speech dysarthric. Psychiatric: Normal judgment and insight. Alert  and oriented x 3. Normal mood.   Data Reviewed:    Labs on Admission: I have personally reviewed following labs and imaging studies  CBC: Recent Labs  Lab 10/31/22 1315 11/04/22 0054 11/04/22 0101 11/04/22 0456  WBC 8.9 10.7*  --  11.2*  NEUTROABS 6.4 6.2  --   --   HGB 13.4 10.7* 11.6* 10.3*  HCT 41.1 33.5* 34.0* 32.7*  MCV 79.5* 81.3  --  81.5  PLT 382 364  --  353   Basic Metabolic Panel: Recent Labs  Lab 10/31/22 1315 11/01/22 1609 11/04/22 0054 11/04/22 0101 11/04/22 0456  NA 135 138 136 137 137  K 4.3 4.4 4.0 4.1 3.9  CL 99 101 103 105 104  CO2 23 24 20*  --  20*  GLUCOSE 248* 217* 213* 214* 184*  BUN 39* 28* 30* 29* 28*  CREATININE 1.73* 1.01 1.83* 2.00* 1.68*  CALCIUM 8.8* 9.9 8.3*  --  8.4*   GFR: Estimated Creatinine Clearance: 41.9 mL/min (A) (by C-G formula based on SCr of 1.68 mg/dL (H)). Liver Function Tests: Recent Labs  Lab 10/31/22 1315 11/04/22 0054  AST 16 13*  ALT 21 14  ALKPHOS 90 71  BILITOT 0.5 0.3  PROT 7.7 6.1*  ALBUMIN 3.5 2.9*   Recent Labs  Lab 10/31/22 1315 11/04/22 0456  LIPASE 21 19   No results for input(s): "AMMONIA" in the last 168 hours. Coagulation Profile: Recent Labs  Lab 11/04/22 0054  INR 1.1   Cardiac Enzymes: No results for input(s): "CKTOTAL", "CKMB", "CKMBINDEX", "TROPONINI" in the last 168 hours. BNP (last 3 results) No results for input(s): "PROBNP" in the last 8760 hours. HbA1C: No results for input(s): "HGBA1C" in the last 72 hours. CBG: Recent Labs  Lab 10/31/22 1153 11/03/22 2317 11/04/22 0523  GLUCAP 243* 197* 164*   Lipid Profile: No results for input(s): "CHOL", "HDL", "LDLCALC", "TRIG", "CHOLHDL", "LDLDIRECT" in the  last 72 hours. Thyroid Function Tests: Recent Labs    11/04/22 0456  TSH 0.547   Anemia Panel: No results for input(s): "VITAMINB12", "FOLATE", "FERRITIN", "TIBC", "IRON", "RETICCTPCT" in the last 72 hours. Urine analysis:    Component Value Date/Time    COLORURINE YELLOW 11/04/2022 0449   APPEARANCEUR HAZY (A) 11/04/2022 0449   LABSPEC 1.010 11/04/2022 0449   PHURINE 5.0 11/04/2022 0449   GLUCOSEU NEGATIVE 11/04/2022 0449   HGBUR SMALL (A) 11/04/2022 0449   BILIRUBINUR NEGATIVE 11/04/2022 0449   KETONESUR NEGATIVE 11/04/2022 0449   PROTEINUR NEGATIVE 11/04/2022 0449   NITRITE NEGATIVE 11/04/2022 0449   LEUKOCYTESUR TRACE (A) 11/04/2022 0449    Radiological Exams on Admission: DG Chest Port 1 View  Result Date: 11/04/2022 CLINICAL DATA:  Shortness of breath EXAM: PORTABLE CHEST 1 VIEW COMPARISON:  10/31/2022 FINDINGS: Cardiac shadow is enlarged but stable. Postsurgical changes are again noted. Lungs are hypoinflated but clear. No bony abnormality is noted. IMPRESSION: No active disease. Electronically Signed   By: Alcide Clever M.D.   On: 11/04/2022 03:06   MR BRAIN WO CONTRAST  Result Date: 11/04/2022 CLINICAL DATA:  Initial evaluation for neuro deficit, stroke suspected. EXAM: MRI HEAD WITHOUT CONTRAST TECHNIQUE: Multiplanar, multiecho pulse sequences of the brain and surrounding structures were obtained without intravenous contrast. COMPARISON:  Prior study from 11/03/2022 as well as earlier exams. FINDINGS: Brain: Mildly advanced cerebral atrophy for age. Patchy and confluent T2/FLAIR hyperintensity involving the periventricular and deep white matter both cerebral hemispheres, consistent with chronic small vessel ischemic disease, moderately advanced in nature. Multiple scattered remote lacunar infarcts present about the deep gray nuclei and pons. Remote left PICA territory infarct involving the inferior left cerebellum. Small remote left frontal infarct involving the left frontal operculum. No evidence for acute or subacute ischemia. Gray-white matter differentiation maintained. No other areas of chronic cortical infarction. No acute intracranial hemorrhage. Multiple scattered chronic micro hemorrhages noted clustered about the brainstem and  deep gray nuclei, most characteristic of chronic poorly controlled hypertension. No mass lesion, midline shift or mass effect. No hydrocephalus or extra-axial fluid collection. Pituitary gland suprasellar region within normal limits. Vascular: Absent flow void within the left V4 segment, which could be related to slow flow and/or occlusion, stable. Major intracranial vascular flow voids are otherwise maintained. Skull and upper cervical spine: Craniocervical junction within normal limits. Bone marrow signal intensity normal. No scalp soft tissue abnormality. Sinuses/Orbits: 1 cm well-circumscribed FLAIR hyperintense lesion noted at the superior aspect of the left orbit, along the superior margin of the left lacrimal gland (series 5, image 14), indeterminate, but relatively stable from prior exams. Globes and orbital soft tissues otherwise unremarkable. Paranasal sinuses are clear. No significant mastoid effusion. Other: None. IMPRESSION: 1. No acute intracranial abnormality. 2. Mildly advanced cerebral atrophy with moderately advanced chronic microvascular ischemic disease, with multiple remote lacunar infarcts involving the deep gray nuclei and pons. Remote left PICA territory infarct, with additional small remote left frontal infarct. 3. Multiple scattered chronic micro hemorrhages clustered about the brainstem and deep gray nuclei, most characteristic of chronic poorly controlled hypertension. 4. 1 cm well-circumscribed FLAIR hyperintense lesion at the superior aspect of the left orbit, indeterminate, but relatively stable from prior exams. Nonemergent outpatient ophthalmology referral suggested for further workup and evaluation. Electronically Signed   By: Rise Mu M.D.   On: 11/04/2022 01:36   CT HEAD CODE STROKE WO CONTRAST  Result Date: 11/03/2022 CLINICAL DATA:  Code stroke. Initial evaluation for neuro deficit, stroke. EXAM:  CT HEAD WITHOUT CONTRAST TECHNIQUE: Contiguous axial images were  obtained from the base of the skull through the vertex without intravenous contrast. RADIATION DOSE REDUCTION: This exam was performed according to the departmental dose-optimization program which includes automated exposure control, adjustment of the mA and/or kV according to patient size and/or use of iterative reconstruction technique. COMPARISON:  Prior CT from 05/22/2022. FINDINGS: Brain: Generalized age-related cerebral atrophy with advanced chronic microvascular ischemic disease. Scattered remote lacunar infarcts present about the deep gray nuclei. Chronic left frontal infarct. Chronic left cerebellar infarct. No acute intracranial hemorrhage. There is question of parenchymal hypodensity involving the superior right cerebellar hemisphere (series 5, image 23). Finding is not entirely certain, and could be artifactual. No other acute large vessel territory infarct. No mass lesion or midline shift. No hydrocephalus or extra-axial fluid collection. Vascular: No abnormal hyperdense vessel. Calcified atherosclerosis present at skull base. Skull: Scalp soft tissues within normal limits.  Calvarium intact. Sinuses/Orbits: Globes and orbital soft tissues within normal limits. Paranasal sinuses and mastoid air cells are clear. Other: None. ASPECTS [facility] Stroke Program Early CT Score) - Ganglionic level infarction (caudate, lentiform nuclei, internal capsule, insula, M1-M3 cortex): 7 - Supraganglionic infarction (M4-M6 cortex): 3 Total score (0-10 with 10 being normal): 10 IMPRESSION: 1. Question parenchymal hypodensity involving the superior right cerebellar hemisphere. While this finding could be artifactual nature, a possible evolving ischemic infarct is difficult to exclude. Correlation with dedicated MRI could be performed for further evaluation as warranted. No intracranial hemorrhage. 2. No other acute intracranial abnormality. 3. Aspects is 10. 4. Underlying atrophy with chronic small vessel ischemic disease  with multiple chronic infarcts as above. These results were communicated to Dr. Derry Lory at 11:37 pm on 11/03/2022 by text page via the [facility] messaging system. Electronically Signed   By: Rise Mu M.D.   On: 11/03/2022 23:38    EKG: Independently reviewed.   Assessment and Plan: * Acute encephalopathy Suspect secondary to symptomatic hypotension + build up of multiple sedating CNS meds in setting of AKI. Med rec pending, but Hold CNS sedating meds: baclofen, gabapentin, flexeril, topamax IVF for hypotension and hold BP meds Note, very similar presentation (AMS in setting of AKI on sedating meds) in [place] of this year.  AKI (acute kidney injury) (HCC) Suspect pre-renal / ATN given presented with severe hypotension (BP 70s with EMS, 80s in ED), in this patient with chronic HYPERtension.  Also h/o N/V/D for past couple of days. IVF Strict intake and output Follow BMPs Hold home BP meds given hypotension Hold any nephro toxic meds.  Nausea & vomiting Unclear cause.  DDx includes pancreatitis, GI side effects of semglutide, diabetic gastroparesis (which shows up on her problem list as well), infectious gastroenteritis among others. Check lipase given h/o acute pancreatitis earlier this year. IVF Clear liquid diet Zofran PRN for the moment (h/o QT prolongation according to DC summary in June, but QT is normal on EKG today).  Chronic anticoagulation Looks like she got started on xarelto at time of DC from hospital stay 5/30-6/5 of this year.  Unfortunately, its not quite clear why? Probably should clarify if she needs to be on xarelto and why, dont see anything about known A.Fib etc.  Chronic heart failure with preserved ejection fraction (HFpEF) (HCC) Watch for development of volume overload with IVF treatment.  History of CAD (coronary artery disease) Cont plavix.  History of cerebral infarction MRI neg for acute stroke today. Looks like she has a history of chronic L  sided  weakness following prior stroke.  Insulin dependent type 2 diabetes mellitus (HCC) Sensitive SSI Q4H for the moment. Will reduce lantus to 20u at bedtime for the moment (looks like she was on 40 at bedtime at baseline based on prior med-rec).  Essential hypertension Hold all home BP meds given presentation to ED with hypotension this evening.  Hyperthyroidism On tapazole at baseline Checking TSH      Advance Care Planning:   Code Status: Full Code   Consults: Neuro  Family Communication: No family in room  Severity of Illness: The appropriate patient status for this patient is OBSERVATION. Observation status is judged to be reasonable and necessary in order to provide the required intensity of service to ensure the patient's safety. The patient's presenting symptoms, physical exam findings, and initial radiographic and laboratory data in the context of their medical condition is felt to place them at decreased risk for further clinical deterioration. Furthermore, it is anticipated that the patient will be medically stable for discharge from the hospital within 2 midnights of admission.   Author: Hillary Bow., DO 11/04/2022 5:47 AM  For on call review [email or web address].
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
## 2022-11-04 NOTE — Assessment & Plan Note (Addendum)
Unclear cause.  DDx includes pancreatitis, GI side effects of semglutide, diabetic gastroparesis (which shows up on her problem list as well), infectious gastroenteritis among others. Check lipase given h/o acute pancreatitis earlier this year. IVF Clear liquid diet Zofran PRN for the moment (h/o QT prolongation according to DC summary in June, but QT is normal on EKG today).

## 2022-11-04 NOTE — Evaluation (Signed)
Physical Therapy Evaluation Patient Details Name: Lori [name] MRN: [id number] DOB: 04/05/1970 Today's Date: 11/04/2022  History of Present Illness  52yo female who presented to [facility] ED 11/04/22 as code stroke with reported R weakness, R facial droop, slurred speech. Per neuro consult, symptoms resolved with IVF and imaging negative for acute stroke, symptoms likely from hypotension rather than acute CVA. PMH CAD, depression, DM, HTN, CVA, thyroid disease, CABG  Clinical Impression  Pt received in bed, sleeping but easily woken. Able to relay some of her PLOF accurately, had to defer to prior charting for the rest, followed commands well and communicated clearly with PT when given extra time due to expressive difficulties. Did need more assist than her baseline for mobility, although still a bit tachycardic and did have some signs of hypotension after sitting up at EOB but unable to check seated BP due to level of assist needed to maintain sitting balance this morning. Do suspect possible orthostatic BP drop with positional change. Left in bed with all needs met this morning. She is interested in possibly going to AIR setting prior to home as she is concerned about current level of mobility, will place referral. See formal PT reccs for POC/DME otherwise.         If plan is discharge home, recommend the following: Assist for transportation;Assistance with feeding;Assistance with cooking/housework;Help with stairs or ramp for entrance;A lot of help with walking and/or transfers;A lot of help with bathing/dressing/bathroom   Can travel by private vehicle        Equipment Recommendations Hospital bed;Hoyer lift  Recommendations for Other Services       Functional Status Assessment Patient has had a recent decline in their functional status and/or demonstrates limited ability to make significant improvements in function in a reasonable and predictable amount of time     Precautions /  Restrictions Precautions Precautions: Fall Precaution Comments: chronic L side weakness 2* CVA Restrictions Weight Bearing Restrictions: No Other Position/Activity Restrictions: L sided weakness      Mobility  Bed Mobility Overal bed mobility: Needs Assistance Bed Mobility: Rolling, Supine to Sit, Sit to Supine Rolling: Max assist   Supine to sit: HOB elevated, Used rails, Max assist Sit to supine: Max assist, HOB elevated   General bed mobility comments: Assist for LEs and trunk. Increased time and use of rails on ED stretcher    Transfers                   General transfer comment: deferred will need +2 especially from ED stretcher    Ambulation/Gait               General Gait Details: deferred, will need +2 especially from ED stretcher  Stairs            Wheelchair Mobility     Tilt Bed    Modified Rankin (Stroke Patients Only)       Balance Overall balance assessment: Needs assistance Sitting-balance support: Feet unsupported, Single extremity supported Sitting balance-Leahy Scale: Poor       Standing balance-Leahy Scale: Poor                               Pertinent Vitals/Pain Pain Assessment Pain Assessment: Faces Faces Pain Scale: Hurts a little bit Pain Location: generalized Pain Descriptors / Indicators: Discomfort Pain Intervention(s): Limited activity within patient's tolerance, Monitored during session    Home Living Family/patient expects to  be discharged to:: Private residence Living Arrangements: Spouse/significant other Available Help at Discharge: Family;Available PRN/intermittently;Personal care attendant Type of Home: Apartment Home Access: Level entry   Entrance Stairs-Number of Steps: 5   Home Layout: One level Home Equipment: Hospital bed;Wheelchair - manual;Tub bench;Other (comment) (platform walker) Additional Comments: Aide daily for 3 hours/day. pt's husband works during the day. reports  being evaluated for power wheelchair and CAPS program    Prior Function Prior Level of Function : Needs assist             Mobility Comments: was getting HHPT earlier this year and able to ambulate minA with platform RW per prior charting ADLs Comments: has home aide 3-4 hours/day 7 days per week, largely bed bound with transfers to [facility], has home purewick system     Extremity/Trunk Assessment   Upper Extremity Assessment Upper Extremity Assessment: Defer to OT evaluation    Lower Extremity Assessment Lower Extremity Assessment: Overall WFL for tasks assessed;LLE deficits/detail RLE Deficits / Details: AROM and strength WFL LLE Deficits / Details: Knee AROM WFL. Baseline weakness, though not severe. She denied numbness of L LE    Cervical / Trunk Assessment Cervical / Trunk Assessment: Kyphotic  Communication   Communication Communication: Difficulty communicating thoughts/reduced clarity of speech Following commands: Follows one step commands consistently;Follows one step commands with increased time Cueing Techniques: Verbal cues;Gestural cues  Cognition Arousal: Alert Behavior During Therapy: WFL for tasks assessed/performed Overall Cognitive Status: Within Functional Limits for tasks assessed                                 General Comments: Expressive difficulties        General Comments General comments (skin integrity, edema, etc.): still dizzy with positional changes, also tachy up to 112bpm just with low level bed mobility    Exercises     Assessment/Plan    PT Assessment Patient needs continued PT services  PT Problem List Decreased strength;Decreased range of motion;Decreased activity tolerance;Decreased balance;Decreased mobility;Pain;Decreased knowledge of use of DME;Obesity       PT Treatment Interventions DME instruction;Gait training;Functional mobility training;Therapeutic activities;Therapeutic exercise;Patient/family  education;Balance training;Wheelchair mobility training;Neuromuscular re-education    PT Goals (Current goals can be found in the Care Plan section)  Acute Rehab PT Goals Patient Stated Goal: go to rehab PT Goal Formulation: With patient Time For Goal Achievement: 11/18/22 Potential to Achieve Goals: Fair    Frequency Min 1X/week     Co-evaluation               AM-PAC PT "6 Clicks" Mobility  Outcome Measure Help needed turning from your back to your side while in a flat bed without using bedrails?: A Lot Help needed moving from lying on your back to sitting on the side of a flat bed without using bedrails?: A Lot Help needed moving to and from a bed to a chair (including a wheelchair)?: Total Help needed standing up from a chair using your arms (e.g., wheelchair or bedside chair)?: Total Help needed to walk in hospital room?: Total Help needed climbing 3-5 steps with a railing? : Total 6 Click Score: 8    End of Session   Activity Tolerance: Patient tolerated treatment well;Patient limited by fatigue Patient left: in bed;with call bell/phone within reach;with bed alarm set Nurse Communication: Mobility status PT Visit Diagnosis: Pain;Hemiplegia and hemiparesis;Muscle weakness (generalized) (M62.81);History of falling (Z91.81);Difficulty in walking, not elsewhere classified (  R26.2);Other abnormalities of gait and mobility (R26.89) Hemiplegia - Right/Left: Left Hemiplegia - dominant/non-dominant: Non-dominant Hemiplegia - caused by: Cerebral infarction    Time: 0921-0940 PT Time Calculation (min) (ACUTE ONLY): 19 min   Charges:   PT Evaluation $PT Eval Moderate Complexity: 1 Mod   PT General Charges $$ ACUTE PT VISIT: 1 Visit        Nedra Hai, PT, DPT 11/04/22 9:55 AM

## 2022-11-05 ENCOUNTER — Ambulatory Visit: Payer: 59 | Admitting: Cardiology

## 2022-11-05 DIAGNOSIS — G9340 Encephalopathy, unspecified: Secondary | ICD-10-CM | POA: Diagnosis not present

## 2022-11-05 LAB — GLUCOSE, CAPILLARY
Glucose-Capillary: 146 mg/dL — ABNORMAL HIGH (ref 70–99)
Glucose-Capillary: 178 mg/dL — ABNORMAL HIGH (ref 70–99)
Glucose-Capillary: 186 mg/dL — ABNORMAL HIGH (ref 70–99)
Glucose-Capillary: 200 mg/dL — ABNORMAL HIGH (ref 70–99)
Glucose-Capillary: 210 mg/dL — ABNORMAL HIGH (ref 70–99)
Glucose-Capillary: 230 mg/dL — ABNORMAL HIGH (ref 70–99)
Glucose-Capillary: 302 mg/dL — ABNORMAL HIGH (ref 70–99)

## 2022-11-05 LAB — COMPREHENSIVE METABOLIC PANEL
ALT: 13 U/L (ref 0–44)
AST: 11 U/L — ABNORMAL LOW (ref 15–41)
Albumin: 2.7 g/dL — ABNORMAL LOW (ref 3.5–5.0)
Alkaline Phosphatase: 68 U/L (ref 38–126)
Anion gap: 8 (ref 5–15)
BUN: 15 mg/dL (ref 6–20)
CO2: 22 mmol/L (ref 22–32)
Calcium: 8.6 mg/dL — ABNORMAL LOW (ref 8.9–10.3)
Chloride: 106 mmol/L (ref 98–111)
Creatinine, Ser: 1.13 mg/dL — ABNORMAL HIGH (ref 0.44–1.00)
GFR, Estimated: 59 mL/min — ABNORMAL LOW (ref >60)
Glucose, Bld: 156 mg/dL — ABNORMAL HIGH (ref 70–99)
Potassium: 3.7 mmol/L (ref 3.5–5.1)
Sodium: 136 mmol/L (ref 135–145)
Total Bilirubin: 0.4 mg/dL (ref 0.3–1.2)
Total Protein: 6.2 g/dL — ABNORMAL LOW (ref 6.5–8.1)

## 2022-11-05 LAB — ACTH STIMULATION, 3 TIME POINTS
Cortisol, 30 Min: 16.5 ug/dL
Cortisol, 60 Min: 20.8 ug/dL
Cortisol, Base: 1 ug/dL

## 2022-11-05 LAB — CBC
HCT: 29.2 % — ABNORMAL LOW (ref 36.0–46.0)
Hemoglobin: 9.7 g/dL — ABNORMAL LOW (ref 12.0–15.0)
MCH: 25.7 pg — ABNORMAL LOW (ref 26.0–34.0)
MCHC: 33.2 g/dL (ref 30.0–36.0)
MCV: 77.5 fL — ABNORMAL LOW (ref 80.0–100.0)
Platelets: 357 10*3/uL (ref 150–400)
RBC: 3.77 MIL/uL — ABNORMAL LOW (ref 3.87–5.11)
RDW: 16.8 % — ABNORMAL HIGH (ref 11.5–15.5)
WBC: 10.1 10*3/uL (ref 4.0–10.5)
nRBC: 0 % (ref 0.0–0.2)

## 2022-11-05 LAB — MAGNESIUM: Magnesium: 1.5 mg/dL — ABNORMAL LOW (ref 1.7–2.4)

## 2022-11-05 MED ORDER — ALUM & MAG HYDROXIDE-SIMETH 200-200-20 MG/5ML PO SUSP
15.0000 mL | Freq: Once | ORAL | Status: AC | PRN
  Administered 2022-11-05: 15 mL via ORAL
  Filled 2022-11-05: qty 30

## 2022-11-05 MED ORDER — VANCOMYCIN HCL IN DEXTROSE 1-5 GM/200ML-% IV SOLN
1000.0000 mg | Freq: Once | INTRAVENOUS | Status: AC
  Administered 2022-11-05: 1000 mg via INTRAVENOUS
  Filled 2022-11-05: qty 200

## 2022-11-05 MED ORDER — LORATADINE 10 MG PO TABS
10.0000 mg | ORAL_TABLET | Freq: Every day | ORAL | Status: DC
  Administered 2022-11-05 – 2022-11-07 (×3): 10 mg via ORAL
  Filled 2022-11-05 (×3): qty 1

## 2022-11-05 MED ORDER — MAGNESIUM SULFATE 2 GM/50ML IV SOLN
2.0000 g | Freq: Once | INTRAVENOUS | Status: AC
  Administered 2022-11-05: 2 g via INTRAVENOUS
  Filled 2022-11-05: qty 50

## 2022-11-05 MED ORDER — SODIUM CHLORIDE 0.9 % IV SOLN
2.0000 g | Freq: Three times a day (TID) | INTRAVENOUS | Status: DC
  Administered 2022-11-05: 2 g via INTRAVENOUS
  Filled 2022-11-05 (×2): qty 12.5

## 2022-11-05 MED ORDER — VANCOMYCIN HCL IN DEXTROSE 1-5 GM/200ML-% IV SOLN
1000.0000 mg | INTRAVENOUS | Status: DC
  Administered 2022-11-06: 1000 mg via INTRAVENOUS
  Filled 2022-11-05: qty 200

## 2022-11-05 NOTE — Progress Notes (Signed)
Physical Therapy Treatment Patient Details Name: Lori [name] MRN: [id number] DOB: 1970/03/04 Today's Date: 11/05/2022   History of Present Illness 52yo female who presented to [facility] ED 11/04/22 as code stroke with reported R weakness, R facial droop, slurred speech. Per neuro consult, symptoms resolved with IVF and imaging negative for acute stroke, symptoms likely from hypotension rather than acute CVA. PMH CAD, depression, DM, HTN, CVA, thyroid disease, CABG    PT Comments  Pt greeted resting in bed and agreeable to session with continued progress towards acute goals. Pt with improved mobility and seated balance this session, demonstrating ability to come to sitting EOB with mod A and maintain sitting balance without external support. Pt able to progress transfers this session, coming to stand with mod A to power up due to L hemi-weakness, and RUE support on RW as pt unable to grasp on L, pt able to progress to take a few side steps to [facility] with mod A to manage RW on L as pt unable to grip with L hand. Pt declining transfer up to chair this session, educated pt on importance of time up OOB with pt verbalizing understanding. Discussed with supervising PT and recommendation updated to address deficits and maximize functional independence. Pt continues to benefit from skilled PT services to progress toward functional mobility goals.      If plan is discharge home, recommend the following: Assist for transportation;Assistance with feeding;Assistance with cooking/housework;Help with stairs or ramp for entrance;A lot of help with walking and/or transfers;A lot of help with bathing/dressing/bathroom   Can travel by private vehicle        Equipment Recommendations  None recommended by PT (pt has hospital bed, wheelchair and RW, declining hoyer lift)    Recommendations for Other Services       Precautions / Restrictions Precautions Precautions: Fall Precaution Comments: chronic L side weakness 2*  CVA Restrictions Weight Bearing Restrictions: No Other Position/Activity Restrictions: L sided weakness     Mobility  Bed Mobility Overal bed mobility: Needs Assistance Bed Mobility: Rolling, Supine to Sit, Sit to Supine Rolling: Contact guard assist (to L)   Supine to sit: HOB elevated, Used rails, Mod assist Sit to supine: HOB elevated, Mod assist   General bed mobility comments: Assist for LEs and trunk. Increased time and use of rails    Transfers Overall transfer level: Needs assistance Equipment used: Rolling walker (2 wheels) Transfers: Sit to/from Stand Sit to Stand: Mod assist           General transfer comment: pt able to come to stand x3 with RUE on RW, on last attempt pt able to take a few side steps up to [facility], pt reporting she stand pivots only at baseline    Ambulation/Gait                   Stairs             Wheelchair Mobility     Tilt Bed    Modified Rankin (Stroke Patients Only)       Balance Overall balance assessment: Needs assistance Sitting-balance support: Feet unsupported, Single extremity supported Sitting balance-Leahy Scale: Poor     Standing balance support: Reliant on assistive device for balance, During functional activity, Single extremity supported Standing balance-Leahy Scale: Poor Standing balance comment: reliant on RUE and external support in standing  Cognition Arousal: Alert Behavior During Therapy: WFL for tasks assessed/performed Overall Cognitive Status: Within Functional Limits for tasks assessed                                 General Comments: Expressive difficulties        Exercises      General Comments General comments (skin integrity, edema, etc.): VSS on RA, BP eleavted but stable, no c/o dizziness with positional changes this session      Pertinent Vitals/Pain Pain Assessment Pain Assessment: Faces Faces Pain Scale: Hurts a  little bit Pain Location: face/ eyes Pain Descriptors / Indicators: Other (Comment) (itching) Pain Intervention(s): Monitored during session, Limited activity within patient's tolerance, Patient requesting pain meds-RN notified    Home Living                          Prior Function            PT Goals (current goals can now be found in the care plan section) Acute Rehab PT Goals Patient Stated Goal: go to home PT Goal Formulation: With patient Time For Goal Achievement: 11/18/22 Progress towards PT goals: Progressing toward goals    Frequency    Min 1X/week      PT Plan      Co-evaluation              AM-PAC PT "6 Clicks" Mobility   Outcome Measure  Help needed turning from your back to your side while in a flat bed without using bedrails?: A Lot Help needed moving from lying on your back to sitting on the side of a flat bed without using bedrails?: A Lot Help needed moving to and from a bed to a chair (including a wheelchair)?: A Lot Help needed standing up from a chair using your arms (e.g., wheelchair or bedside chair)?: A Lot Help needed to walk in hospital room?: Total Help needed climbing 3-5 steps with a railing? : Total 6 Click Score: 10    End of Session   Activity Tolerance: Patient tolerated treatment well;Patient limited by fatigue Patient left: in bed;with call bell/phone within reach;with bed alarm set Nurse Communication: Mobility status PT Visit Diagnosis: Pain;Hemiplegia and hemiparesis;Muscle weakness (generalized) (M62.81);History of falling (Z91.81);Difficulty in walking, not elsewhere classified (R26.2);Other abnormalities of gait and mobility (R26.89) Hemiplegia - Right/Left: Left Hemiplegia - dominant/non-dominant: Non-dominant Hemiplegia - caused by: Cerebral infarction     Time: 4034-7425 PT Time Calculation (min) (ACUTE ONLY): 25 min  Charges:    $Therapeutic Activity: 23-37 mins PT General Charges $$ ACUTE PT  VISIT: 1 Visit                     Lori Romano R. PTA Acute Rehabilitation Services Office: [phone]   Catalina Antigua 11/05/2022, 12:52 PM

## 2022-11-05 NOTE — Progress Notes (Signed)
Transition of Care [facility]) - Inpatient Brief Assessment   Patient Details  Name: Lori [name] MRN: [id number] Date of Birth: [birth year]  Transition of Care [facility]) CM/SW Contact:    Janae Bridgeman, RN Phone Number: 11/05/2022, 1:39 PM   Clinical Narrative: CM met with the patient and husband at the bedside regarding TOC needs.  The patient admitted to the hospital for septicemia and currently has personal care services through [facility] through patient's Medicaid benefits.   The patient states that she hoped that she would qualify for CAPS program but was declined for program during last assessment.  Patient was updated that PT evaluation was completed and patient is recommended to return home with home health services.  Husband is presently at the bedside.  Medicare choice for home health services was offered and patient would like return of home health services through [facility].  I called Kandee Keen, RNCM with [facility] and facility accepted for services.  Home health orders will be placed to be co-signed by the MD.  Frances Furbish accepted for [facility] PT, MSW.  DME at the home includes Hospital bed, WC, RW and no hoyer lift at this time.   Patient states that she is able to transfer to her wheelchair with assistance by aide/husband.  When patient is medically stable - patient plans to discharge home by [facility] transport.   Transition of Care Asessment: Insurance and Status: (P) Insurance coverage has been reviewed Patient has primary care physician: (P) Yes Home environment has been reviewed: (P) Home with spouse Prior level of function:: (P) assistance with PCS Services at the home through [facility] home health Prior/Current Home Services: (P) Current home services Social Determinants of Health Reivew: (P) SDOH reviewed interventions complete Readmission risk has been reviewed: (P) Yes Transition of care needs: (P) transition of care needs identified, TOC will  continue to follow

## 2022-11-05 NOTE — Progress Notes (Addendum)
Pharmacy Antibiotic Note  Lori [name] is a 52 y.o. female admitted on 11/03/2022 with code stroke given reported right sided weakness now with concerns for acute encephalopathy and sepsis. Pharmacy has been consulted for cefepime and vancomycin dosing.  Scr has improved to 1.13. Empirically r/o sepsis for now. Wbc down to 10.1, PCT<0.1. Cultures pending. Vanc load was never given in the ED so we will give a load today.   Plan: -Cefepime 2g IV every 8 hours -Vancomycin 1000mg  IV x1 -Vancomycin 1000mg  IV every 24 hours (AUC 510, scr 1.13) -Follow up signs of clinical improvement, LOT, de-escalation of antibiotics  Height: (P) 5\' 2"  (157.5 cm) IBW/kg (Calculated) : (P) 50.1  Temp (24hrs), Avg:98.6 F (37 C), Min:98.1 F (36.7 C), Max:99.5 F (37.5 C)  Recent Labs  Lab 10/31/22 1315 11/01/22 1609 11/04/22 0054 11/04/22 0101 11/04/22 0456 11/04/22 0910 11/05/22 0639  WBC 8.9  --  10.7*  --  11.2*  --  10.1  CREATININE 1.73* 1.01 1.83* 2.00* 1.68*  --  1.13*  LATICACIDVEN  --   --   --   --   --  2.7*  --     Estimated Creatinine Clearance: 62.3 mL/min (A) (by C-G formula based on SCr of 1.13 mg/dL (H)).    Allergies  Allergen Reactions   Asa [Aspirin] Anaphylaxis and Swelling    Throat closes   Contrast Media [Iodinated Contrast Media] Anaphylaxis and Swelling   Imitrex [Sumatriptan] Anaphylaxis   Penicillins Swelling    Mouth and eye swelling   Firvanq [Vancomycin] Other (See Comments)    Red Man's Syndrome   Cipro [Ciprofloxacin Hcl] Nausea And Vomiting   Ms Contin [Morphine] Swelling   Wound Dressing Adhesive Itching and Rash    Antimicrobials this admission: Cefepime 9/30 >>  Vancomycin 9/30 >>   Microbiology results: 9/30 BCx: ngtd 9/30 UCx: ngtd    Ulyses Southward, PharmD, BCIDP, AAHIVP, CPP Infectious Disease Pharmacist 11/05/2022 10:13 AM

## 2022-11-05 NOTE — Progress Notes (Signed)
PROGRESS NOTE  Lori [name] NWG:[id number] DOB: [birth year] DOA: 11/03/2022 PCP: Caesar Bookman, NP   LOS: 1 day   Brief Narrative / Interim history: No charge note, admitted overnight, H&P reviewed and agree with the assessment and plan  This is a 52 year old female with DM2, HTN, pheochromocytoma status post unilateral adrenalectomy in 2018, prior stroke with residual left-sided weakness, CAD status post CABG and ongoing pharmacy, comes into the hospital as a code stroke with right-sided weakness.  She had a similar presentation in March 2024 of lethargy in the setting of AKI, felt to be related to polypharmacy and medication buildup in the setting of worsening renal function.  She was evaluated initially as a code stroke, underwent CT and MRI which were negative for acute findings.  She has been persistently hypotensive this morning   Subjective / 24h Interval events: Feeling much better, but tells me that she is starting to appreciate that she is having more hand and facial swelling today.  Assesement and Plan: Principal Problem:   Acute encephalopathy Active Problems:   Nausea & vomiting   AKI (acute kidney injury) (HCC)   History of CVA with spastic paresis (cerebrovascular accident)   Hyperthyroidism   Essential hypertension   Insulin dependent type 2 diabetes mellitus (HCC)   History of cerebral infarction   Left hemiplegia (HCC)   History of CAD (coronary artery disease)   Chronic heart failure with preserved ejection fraction (HFpEF) (HCC)   Chronic anticoagulation   Hypotension   Acute metabolic encephalopathy   Principal problem Acute metabolic encephalopathy -perhaps secondary to symptomatic hypotension and buildup of multiple sedating medications in the setting of acute kidney injury.  Continue supportive care, mental status has improved significantly and she appears back to baseline today  Active problems Hypotension -quite profound on my evaluation with  systolic in the 70s while in the emergency room.  Etiology is not entirely clear, but given hypothermia and leukocytosis there may be an infectious component, obtain cultures and start antibiotics -Has a low-grade temp this morning of 99.5.  Monitor cultures, however she refused to get them initially and they appear to have been obtained after she received antibiotics -Cortisol borderline low, inappropriate given the degree of hypotension and acute illness.  Obtain stim test which is pending this morning.  Will give Decadron for today.  I see that she has had several courses of prednisone, most recent one September 2024  History of unilateral adrenalectomy -noted.   Acute kidney injury -most recent creatinine at her discharge was 1.0.  Creatinine 2.0 on admission, has received fluids and now has improved to 1.1.  Discontinue further fluids since she has a degree of fluid overload  Hypomagnesemia-replace magnesium and continue to monitor  Microcytic anemia-no bleeding, of chronic renal disease.  Hyperthyroidism-TSH unremarkable, on Tapazole at baseline.  Prior CVA -MRI negative for acute stroke, has chronic left-sided weakness.  Neurology evaluated patient in the ER -PT recommended CIR, however CIR will not pursue admission for her.  Needs SNF.  TOC consulted  Chronic diastolic CHF-hold fluids as above  History of CAD-continue Plavix  Chronic anticoagulation-initially it will not apparent why she was on Xarelto.  Patient reports a DVT in 2023, diagnosed while she was in [place], and came back to [place] since.  It appears that she is on prophylactic dose currently  Obesity, class II-BMI 36  Polypharmacy -she wants to cut down as many medications as possible.  Will see what her needs are over the next  day or 2 and make that decision upon discharge  Scheduled Meds:  atorvastatin  80 mg Oral QHS   budesonide  0.25 mg Nebulization BID   clopidogrel  75 mg Oral Daily   dexamethasone  (DECADRON) injection  4 mg Intravenous Q12H   dicyclomine  20 mg Oral BID   escitalopram  20 mg Oral q morning   ezetimibe  10 mg Oral QHS   insulin aspart  0-9 Units Subcutaneous Q4H   insulin glargine-yfgn  20 Units Subcutaneous QHS   methimazole  5 mg Oral q morning   pantoprazole  40 mg Oral Daily   ranolazine  500 mg Oral BID   rivaroxaban  10 mg Oral Daily   topiramate  25 mg Oral BID   Continuous Infusions:  ceFEPime (MAXIPIME) IV 2 g (11/05/22 0820)   magnesium sulfate bolus IVPB     vancomycin     [START ON 11/06/2022] vancomycin     PRN Meds:.acetaminophen **OR** acetaminophen, albuterol, diphenhydrAMINE-zinc acetate, hydrOXYzine, ondansetron **OR** ondansetron (ZOFRAN) IV  Current Outpatient Medications  Medication Instructions   albuterol (PROVENTIL) 2.5 mg, Nebulization, Every 6 hours PRN   albuterol (VENTOLIN HFA) 108 (90 Base) MCG/ACT inhaler INHALE 2 PUFFS BY MOUTH EVERY 6 HOURS AS NEEDED FOR WHEEZING AND FOR SHORTNESS OF BREATH   atorvastatin (LIPITOR) 80 mg, Oral, Daily   baclofen (LIORESAL) 10 mg, Oral, 3 times daily, X 1 week, then 10 mg TID-for spasticity- can increase  the dose if you call me to discuss   blood glucose meter kit and supplies 1 each, Other, As directed, Dispense based on patient and insurance preference. Use up to four times daily as directed. (FOR ICD-10 E10.9, E11.9).   budesonide (PULMICORT) 0.25 mg, Nebulization, 2 times daily   clopidogrel (PLAVIX) 75 mg, Oral, Daily   Continuous Glucose Receiver (FREESTYLE LIBRE 2 READER) DEVI 1 Device, Does not apply, Daily, E11.69   Continuous Glucose Sensor (FREESTYLE LIBRE 14 DAY SENSOR) MISC 1 each, Does not apply, As needed, DX: E11.69   cyclobenzaprine (FLEXERIL) 5 mg, Oral, 3 times daily PRN   dicyclomine (BENTYL) 20 mg, Oral, 2 times daily   escitalopram (LEXAPRO) 20 mg, Oral, Every morning   ezetimibe (ZETIA) 10 mg, Oral, Daily   gabapentin (NEURONTIN) 100 mg, Oral, 3 times daily   insulin  aspart (NOVOLOG) 15 Units, Subcutaneous, 3 times daily with meals   Insulin Pen Needle 32G X 4 MM MISC Use to inject insulin 4 times daily as directed.   LANTUS SOLOSTAR 100 UNIT/ML Solostar Pen INJECT 40 UNITS SUBCUTANEOUSLY AT BEDTIME   LORazepam (ATIVAN) 0.5 mg, Oral, Daily at bedtime   methimazole (TAPAZOLE) 5 mg, Oral, Every morning   metoprolol tartrate (LOPRESSOR) 100 mg, Oral, 2 times daily   nystatin (MYCOSTATIN/NYSTOP) powder 1 Application, Topical, 2 times daily PRN   olmesartan-hydrochlorothiazide (BENICAR HCT) 40-25 MG tablet 1 tablet, Oral, Daily   omeprazole (PRILOSEC) 20 mg, Oral, Daily   Ozempic (0.25 or 0.5 MG/DOSE) 0.5 mg, Subcutaneous, Weekly   polyethylene glycol powder (GLYCOLAX/MIRALAX) 17 g, Oral, Daily, Hold for loose stool   ranolazine (RANEXA) 500 mg, Oral, 2 times daily   rivaroxaban (XARELTO) 10 mg, Oral, Daily   senna-docusate (SENOKOT-S) 8.6-50 MG tablet 1 tablet, Oral, Daily   topiramate (TOPAMAX) 25 mg, Oral, 2 times daily    Diet Orders (From admission, onward)     Start     Ordered   11/04/22 0454  Diet clear liquid Room service appropriate? Yes; Fluid consistency:  Thin  Diet effective now       Question Answer Comment  Room service appropriate? Yes   Fluid consistency: Thin      11/04/22 0454            DVT prophylaxis: rivaroxaban (XARELTO) tablet 10 mg Start: 11/04/22 1430 rivaroxaban (XARELTO) tablet 10 mg   Lab Results  Component Value Date   PLT 357 11/05/2022      Code Status: Full Code  Family Communication: no family at bedside   Status is: Inpatient  Level of care: Telemetry Medical  Consultants:  Neurology   Objective: Vitals:   11/05/22 0009 11/05/22 0339 11/05/22 0828 11/05/22 0837  BP: (!) 157/85 (!) 141/93 (!) 136/96   Pulse: 97 90 97   Resp: 17 18 16    Temp: 98.7 F (37.1 C) 98.8 F (37.1 C) 99.5 F (37.5 C)   TempSrc: Oral Oral Oral   SpO2: 96% 98% 99% 96%  Height:        Intake/Output Summary (Last  24 hours) at 11/05/2022 1003 Last data filed at 11/05/2022 0504 Gross per 24 hour  Intake 4093.34 ml  Output 1000 ml  Net 3093.34 ml   Wt Readings from Last 3 Encounters:  11/01/22 90.7 kg  10/31/22 88.5 kg  10/17/22 96 kg    Examination:  Constitutional: NAD Eyes: lids and conjunctivae normal, no scleral icterus ENMT: mmm Neck: normal, supple Respiratory: clear to auscultation bilaterally, no wheezing, no crackles.  Cardiovascular: Regular rate and rhythm, no murmurs / rubs / gallops. No LE edema. Abdomen: soft, no distention, no tenderness. Bowel sounds positive.   Data Reviewed: I have independently reviewed following labs and imaging studies   CBC Recent Labs  Lab 10/31/22 1315 11/04/22 0054 11/04/22 0101 11/04/22 0456 11/05/22 0639  WBC 8.9 10.7*  --  11.2* 10.1  HGB 13.4 10.7* 11.6* 10.3* 9.7*  HCT 41.1 33.5* 34.0* 32.7* 29.2*  PLT 382 364  --  353 357  MCV 79.5* 81.3  --  81.5 77.5*  MCH 25.9* 26.0  --  25.7* 25.7*  MCHC 32.6 31.9  --  31.5 33.2  RDW 17.5* 17.3*  --  17.3* 16.8*  LYMPHSABS 1.8 3.3  --   --   --   MONOABS 0.5 0.8  --   --   --   EOSABS 0.1 0.3  --   --   --   BASOSABS 0.0 0.1  --   --   --     Recent Labs  Lab 10/31/22 1315 11/01/22 1609 11/04/22 0054 11/04/22 0101 11/04/22 0456 11/04/22 0730 11/04/22 0910 11/05/22 0639  NA 135 138 136 137 137  --   --  136  K 4.3 4.4 4.0 4.1 3.9  --   --  3.7  CL 99 101 103 105 104  --   --  106  CO2 23 24 20*  --  20*  --   --  22  GLUCOSE 248* 217* 213* 214* 184*  --   --  156*  BUN 39* 28* 30* 29* 28*  --   --  15  CREATININE 1.73* 1.01 1.83* 2.00* 1.68*  --   --  1.13*  CALCIUM 8.8* 9.9 8.3*  --  8.4*  --   --  8.6*  AST 16  --  13*  --   --   --   --  11*  ALT 21  --  14  --   --   --   --  13  ALKPHOS 90  --  71  --   --   --   --  68  BILITOT 0.5  --  0.3  --   --   --   --  0.4  ALBUMIN 3.5  --  2.9*  --   --   --   --  2.7*  MG  --   --   --   --   --   --   --  1.5*  PROCALCITON   --   --   --   --   --  <0.10  --   --   LATICACIDVEN  --   --   --   --   --   --  2.7*  --   INR  --   --  1.1  --   --   --   --   --   TSH  --   --   --   --  0.547  --   --   --     ------------------------------------------------------------------------------------------------------------------ No results for input(s): "CHOL", "HDL", "LDLCALC", "TRIG", "CHOLHDL", "LDLDIRECT" in the last 72 hours.  Lab Results  Component Value Date   HGBA1C 9.8 (H) 08/27/2022   ------------------------------------------------------------------------------------------------------------------ Recent Labs    11/04/22 0456  TSH 0.547    Cardiac Enzymes No results for input(s): "CKMB", "TROPONINI", "MYOGLOBIN" in the last 168 hours.  Invalid input(s): "CK" ------------------------------------------------------------------------------------------------------------------    Component Value Date/Time   BNP 19.5 09/06/2022 2242    CBG: Recent Labs  Lab 11/04/22 1705 11/04/22 1926 11/05/22 0011 11/05/22 0342 11/05/22 0826  GLUCAP 275* 239* 186* 178* 146*    Recent Results (from the past 240 hour(s))  Urine Culture (for pregnant, neutropenic or urologic patients or patients with an indwelling urinary catheter)     Status: None (Preliminary result)   Collection Time: 11/04/22 11:10 AM   Specimen: Urine, Clean Catch  Result Value Ref Range Status   Specimen Description URINE, CLEAN CATCH  Final   Special Requests NONE  Final   Culture   Final    CULTURE REINCUBATED FOR BETTER GROWTH Performed at [facility], [address], [place], [place] [address]    Report Status PENDING  Incomplete  Culture, blood (Routine X 2) w Reflex to ID Panel     Status: None (Preliminary result)   Collection Time: 11/04/22  9:03 PM   Specimen: BLOOD  Result Value Ref Range Status   Specimen Description BLOOD FOREARM  Final   Special Requests   Final    BOTTLES DRAWN AEROBIC AND ANAEROBIC Blood  Culture adequate volume   Culture   Final    NO GROWTH < 12 HOURS Performed at [facility], [address], [place], [place] [address]    Report Status PENDING  Incomplete  Culture, blood (Routine X 2) w Reflex to ID Panel     Status: None (Preliminary result)   Collection Time: 11/04/22  9:11 PM   Specimen: BLOOD RIGHT ARM  Result Value Ref Range Status   Specimen Description BLOOD RIGHT ARM  Final   Special Requests   Final    BOTTLES DRAWN AEROBIC AND ANAEROBIC Blood Culture adequate volume   Culture   Final    NO GROWTH < 12 HOURS Performed at [facility], [address], [place], [place] [address]    Report Status PENDING  Incomplete     Radiology Studies: No results  found.   Pamella Pert, MD, PhD [facility]  Between 7 am - 7 pm I am available, please contact me via Amion (for emergencies) or Securechat (non urgent messages)  Between 7 pm - 7 am I am not available, please contact night coverage MD/APP via Amion

## 2022-11-05 NOTE — Plan of Care (Signed)
  Problem: Fluid Volume: Goal: Hemodynamic stability will improve Outcome: Progressing   Problem: Clinical Measurements: Goal: Diagnostic test results will improve Outcome: Progressing Goal: Signs and symptoms of infection will decrease Outcome: Progressing   Problem: Respiratory: Goal: Ability to maintain adequate ventilation will improve Outcome: Progressing   Problem: Education: Goal: Understanding of CV disease, CV risk reduction, and recovery process will improve Outcome: Progressing Goal: Individualized Educational Video(s) Outcome: Progressing   Problem: Activity: Goal: Ability to return to baseline activity level will improve Outcome: Progressing   Problem: Cardiovascular: Goal: Ability to achieve and maintain adequate cardiovascular perfusion will improve Outcome: Progressing Goal: Vascular access site(s) Level 0-1 will be maintained Outcome: Progressing   Problem: Health Behavior/Discharge Planning: Goal: Ability to safely manage health-related needs after discharge will improve Outcome: Progressing   Problem: Education: Goal: Ability to describe self-care measures that may prevent or decrease complications (Diabetes Survival Skills Education) will improve Outcome: Progressing Goal: Individualized Educational Video(s) Outcome: Progressing   Problem: Coping: Goal: Ability to adjust to condition or change in health will improve Outcome: Progressing   Problem: Fluid Volume: Goal: Ability to maintain a balanced intake and output will improve Outcome: Progressing   Problem: Health Behavior/Discharge Planning: Goal: Ability to identify and utilize available resources and services will improve Outcome: Progressing Goal: Ability to manage health-related needs will improve Outcome: Progressing   Problem: Metabolic: Goal: Ability to maintain appropriate glucose levels will improve Outcome: Progressing   Problem: Nutritional: Goal: Maintenance of adequate  nutrition will improve Outcome: Progressing Goal: Progress toward achieving an optimal weight will improve Outcome: Progressing   Problem: Skin Integrity: Goal: Risk for impaired skin integrity will decrease Outcome: Progressing   Problem: Tissue Perfusion: Goal: Adequacy of tissue perfusion will improve Outcome: Progressing   Problem: Education: Goal: Knowledge of General Education information will improve Description: Including pain rating scale, medication(s)/side effects and non-pharmacologic comfort measures Outcome: Progressing   Problem: Health Behavior/Discharge Planning: Goal: Ability to manage health-related needs will improve Outcome: Progressing   Problem: Clinical Measurements: Goal: Ability to maintain clinical measurements within normal limits will improve Outcome: Progressing Goal: Will remain free from infection Outcome: Progressing Goal: Diagnostic test results will improve Outcome: Progressing Goal: Respiratory complications will improve Outcome: Progressing Goal: Cardiovascular complication will be avoided Outcome: Progressing   Problem: Activity: Goal: Risk for activity intolerance will decrease Outcome: Progressing   Problem: Nutrition: Goal: Adequate nutrition will be maintained Outcome: Progressing   Problem: Coping: Goal: Level of anxiety will decrease Outcome: Progressing   Problem: Elimination: Goal: Will not experience complications related to bowel motility Outcome: Progressing Goal: Will not experience complications related to urinary retention Outcome: Progressing   Problem: Pain Managment: Goal: General experience of comfort will improve Outcome: Progressing   Problem: Safety: Goal: Ability to remain free from injury will improve Outcome: Progressing   Problem: Skin Integrity: Goal: Risk for impaired skin integrity will decrease Outcome: Progressing

## 2022-11-06 DIAGNOSIS — G9341 Metabolic encephalopathy: Secondary | ICD-10-CM

## 2022-11-06 DIAGNOSIS — G9340 Encephalopathy, unspecified: Secondary | ICD-10-CM | POA: Diagnosis not present

## 2022-11-06 DIAGNOSIS — Z7901 Long term (current) use of anticoagulants: Secondary | ICD-10-CM | POA: Diagnosis not present

## 2022-11-06 DIAGNOSIS — R112 Nausea with vomiting, unspecified: Secondary | ICD-10-CM | POA: Diagnosis not present

## 2022-11-06 DIAGNOSIS — N179 Acute kidney failure, unspecified: Secondary | ICD-10-CM | POA: Diagnosis not present

## 2022-11-06 LAB — CBC
HCT: 30.8 % — ABNORMAL LOW (ref 36.0–46.0)
Hemoglobin: 10.3 g/dL — ABNORMAL LOW (ref 12.0–15.0)
MCH: 26 pg (ref 26.0–34.0)
MCHC: 33.4 g/dL (ref 30.0–36.0)
MCV: 77.8 fL — ABNORMAL LOW (ref 80.0–100.0)
Platelets: 380 10*3/uL (ref 150–400)
RBC: 3.96 MIL/uL (ref 3.87–5.11)
RDW: 16.7 % — ABNORMAL HIGH (ref 11.5–15.5)
WBC: 10.6 10*3/uL — ABNORMAL HIGH (ref 4.0–10.5)
nRBC: 0 % (ref 0.0–0.2)

## 2022-11-06 LAB — COMPREHENSIVE METABOLIC PANEL
ALT: 14 U/L (ref 0–44)
AST: 12 U/L — ABNORMAL LOW (ref 15–41)
Albumin: 3 g/dL — ABNORMAL LOW (ref 3.5–5.0)
Alkaline Phosphatase: 73 U/L (ref 38–126)
Anion gap: 8 (ref 5–15)
BUN: 13 mg/dL (ref 6–20)
CO2: 23 mmol/L (ref 22–32)
Calcium: 8.8 mg/dL — ABNORMAL LOW (ref 8.9–10.3)
Chloride: 102 mmol/L (ref 98–111)
Creatinine, Ser: 0.98 mg/dL (ref 0.44–1.00)
GFR, Estimated: >60 mL/min (ref >60)
Glucose, Bld: 173 mg/dL — ABNORMAL HIGH (ref 70–99)
Potassium: 3.7 mmol/L (ref 3.5–5.1)
Sodium: 133 mmol/L — ABNORMAL LOW (ref 135–145)
Total Bilirubin: 0.4 mg/dL (ref 0.3–1.2)
Total Protein: 6.6 g/dL (ref 6.5–8.1)

## 2022-11-06 LAB — GLUCOSE, CAPILLARY
Glucose-Capillary: 133 mg/dL — ABNORMAL HIGH (ref 70–99)
Glucose-Capillary: 136 mg/dL — ABNORMAL HIGH (ref 70–99)
Glucose-Capillary: 141 mg/dL — ABNORMAL HIGH (ref 70–99)
Glucose-Capillary: 161 mg/dL — ABNORMAL HIGH (ref 70–99)
Glucose-Capillary: 184 mg/dL — ABNORMAL HIGH (ref 70–99)
Glucose-Capillary: 187 mg/dL — ABNORMAL HIGH (ref 70–99)
Glucose-Capillary: 195 mg/dL — ABNORMAL HIGH (ref 70–99)

## 2022-11-06 LAB — ACTH: C206 ACTH: 4 pg/mL — ABNORMAL LOW (ref 7.2–63.3)

## 2022-11-06 LAB — MAGNESIUM: Magnesium: 2.2 mg/dL (ref 1.7–2.4)

## 2022-11-06 MED ORDER — SODIUM CHLORIDE 0.9 % IV SOLN
2.0000 g | Freq: Three times a day (TID) | INTRAVENOUS | Status: DC
  Administered 2022-11-06 – 2022-11-07 (×5): 2 g via INTRAVENOUS
  Filled 2022-11-06 (×5): qty 12.5

## 2022-11-06 MED ORDER — ALUM & MAG HYDROXIDE-SIMETH 200-200-20 MG/5ML PO SUSP
15.0000 mL | Freq: Once | ORAL | Status: AC | PRN
  Administered 2022-11-06: 15 mL via ORAL
  Filled 2022-11-06: qty 30

## 2022-11-06 MED ORDER — LABETALOL HCL 5 MG/ML IV SOLN
5.0000 mg | INTRAVENOUS | Status: DC | PRN
  Administered 2022-11-06 (×2): 5 mg via INTRAVENOUS
  Filled 2022-11-06 (×2): qty 4

## 2022-11-06 MED ORDER — METOPROLOL TARTRATE 50 MG PO TABS
100.0000 mg | ORAL_TABLET | Freq: Two times a day (BID) | ORAL | Status: DC
  Administered 2022-11-06 – 2022-11-07 (×4): 100 mg via ORAL
  Filled 2022-11-06 (×2): qty 2
  Filled 2022-11-06: qty 4
  Filled 2022-11-06: qty 2

## 2022-11-06 NOTE — Progress Notes (Addendum)
PROGRESS NOTE  Lori [name] ZOX:[id number] DOB: 11/17/1970 DOA: 11/03/2022 PCP: Caesar Bookman, NP   LOS: 2 days   Brief Narrative / Interim history: Patient is a 52 year old female with DM2, HTN, pheochromocytoma status post unilateral adrenalectomy in 2018, prior stroke with residual left-sided weakness, CAD status post CABG presented to hospital as code stroke with right-sided weakness.  Similar presentation in March 2024 with lethargy and AKI.  At that time she was noted to have polypharmacy causing worsening mentation and the pattern of AKI.  At this time, patient has undergone CT and MRI of the brain which have been negative for acute findings.   Subjective Today, patient was seen and examined at bedside.  Complains of ulceration over the lower abdomen.  Has slurred speech.  Had mild nausea and vomiting this morning.  Has been having trouble sleeping.  Assesement and Plan:  Acute metabolic encephalopathy - Secondary to polypharmacy with AKI.  Improved at this time.  Blood cultures negative in 2 days.  Will continue to monitor.  Empirically on antibiotic.  Will discontinue antibiotic by tomorrow if cultures remain negative.  Temperature max of 99.3 F..  Urine culture with 80,000 colonies of Enterococcus faecalis.  Epistaxis overnight.  Has improved at this time.  No active bleeding.  Hypotension now elevated blood pressure.-initially blood pressure was extremely low.  Had low-grade fever.  Patient had refused cultures.  Cortisol borderline low.  ACTH stimulation test with cortisol response at 16 and 30 minutes.  Received Decadron.  Had several courses of prednisone in the past.  Has been started on metoprolol.  History of unilateral adrenalectomy for pheochromocytoma.  Acute kidney injury -received IV fluids.  Creatinine has now improved to 0.9.  Hypomagnesemia-improved today after replacement.  Magnesium today 2.2.  Microcytic anemia-, anemia of chronic disease.  No obvious  signs of bleeding.  Latest hemoglobin of 10.3.  Hyperthyroidism-on Tapazole.  TSH within normal range  Prior CVA -MRI negative for acute stroke, has chronic left-sided weakness.  Neurology saw the patient during hospitalization.  At this time plan for skilled nursing facility placement.  Chronic diastolic CHF-continue strict intake and output charting, daily weights.  History of CAD-continue Plavix  Chronic anticoagulation for DVT 2023-currently on prophylactic dose.  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  Would benefit from weight loss as outpatient.  Polypharmacy -she wants to cut down as many medications as possible.  Will continue to wean possible medications.  Diabetes mellitus type 2 with hyperglycemia. Continue with sliding scale insulin, long-acting insulin.  Closely monitor blood glucose levels.  Scheduled Meds:  atorvastatin  80 mg Oral QHS   budesonide  0.25 mg Nebulization BID   clopidogrel  75 mg Oral Daily   dicyclomine  20 mg Oral BID   escitalopram  20 mg Oral q morning   ezetimibe  10 mg Oral QHS   insulin aspart  0-9 Units Subcutaneous Q4H   insulin glargine-yfgn  20 Units Subcutaneous QHS   loratadine  10 mg Oral Daily   methimazole  5 mg Oral q morning   metoprolol tartrate  100 mg Oral BID   pantoprazole  40 mg Oral Daily   ranolazine  500 mg Oral BID   rivaroxaban  10 mg Oral Daily   topiramate  25 mg Oral BID   Continuous Infusions:  ceFEPime (MAXIPIME) IV 2 g (11/06/22 0143)   vancomycin     PRN Meds:.acetaminophen **OR** acetaminophen, albuterol, diphenhydrAMINE-zinc acetate, hydrOXYzine, labetalol, ondansetron **OR** ondansetron (ZOFRAN) IV  Current Outpatient  Medications  Medication Instructions   albuterol (PROVENTIL) 2.5 mg, Nebulization, Every 6 hours PRN   albuterol (VENTOLIN HFA) 108 (90 Base) MCG/ACT inhaler INHALE 2 PUFFS BY MOUTH EVERY 6 HOURS AS NEEDED FOR WHEEZING AND FOR SHORTNESS OF BREATH   atorvastatin (LIPITOR) 80 mg, Oral, Daily   baclofen  (LIORESAL) 10 mg, Oral, 3 times daily, X 1 week, then 10 mg TID-for spasticity- can increase  the dose if you call me to discuss   blood glucose meter kit and supplies 1 each, Other, As directed, Dispense based on patient and insurance preference. Use up to four times daily as directed. (FOR ICD-10 E10.9, E11.9).   budesonide (PULMICORT) 0.25 mg, Nebulization, 2 times daily   clopidogrel (PLAVIX) 75 mg, Oral, Daily   Continuous Glucose Receiver (FREESTYLE LIBRE 2 READER) DEVI 1 Device, Does not apply, Daily, E11.69   Continuous Glucose Sensor (FREESTYLE LIBRE 14 DAY SENSOR) MISC 1 each, Does not apply, As needed, DX: E11.69   cyclobenzaprine (FLEXERIL) 5 mg, Oral, 3 times daily PRN   dicyclomine (BENTYL) 20 mg, Oral, 2 times daily   escitalopram (LEXAPRO) 20 mg, Oral, Every morning   ezetimibe (ZETIA) 10 mg, Oral, Daily   gabapentin (NEURONTIN) 100 mg, Oral, 3 times daily   insulin aspart (NOVOLOG) 15 Units, Subcutaneous, 3 times daily with meals   Insulin Pen Needle 32G X 4 MM MISC Use to inject insulin 4 times daily as directed.   LANTUS SOLOSTAR 100 UNIT/ML Solostar Pen INJECT 40 UNITS SUBCUTANEOUSLY AT BEDTIME   LORazepam (ATIVAN) 0.5 mg, Oral, Daily at bedtime   methimazole (TAPAZOLE) 5 mg, Oral, Every morning   metoprolol tartrate (LOPRESSOR) 100 mg, Oral, 2 times daily   nystatin (MYCOSTATIN/NYSTOP) powder 1 Application, Topical, 2 times daily PRN   olmesartan-hydrochlorothiazide (BENICAR HCT) 40-25 MG tablet 1 tablet, Oral, Daily   omeprazole (PRILOSEC) 20 mg, Oral, Daily   Ozempic (0.25 or 0.5 MG/DOSE) 0.5 mg, Subcutaneous, Weekly   polyethylene glycol powder (GLYCOLAX/MIRALAX) 17 g, Oral, Daily, Hold for loose stool   ranolazine (RANEXA) 500 mg, Oral, 2 times daily   rivaroxaban (XARELTO) 10 mg, Oral, Daily   senna-docusate (SENOKOT-S) 8.6-50 MG tablet 1 tablet, Oral, Daily   topiramate (TOPAMAX) 25 mg, Oral, 2 times daily    DVT prophylaxis: rivaroxaban (XARELTO) tablet 10 mg  Start: 11/04/22 1430 rivaroxaban (XARELTO) tablet 10 mg   Lab Results  Component Value Date   PLT 380 11/06/2022      Code Status: Full Code  Family Communication:   None at bedside   Status is: Inpatient  Level of care: Telemetry Medical  Disposition.  Home with home health as per PT evaluation likely in 1 to 2 days..  Consultants:  Neurology   Objective: Vitals:   11/06/22 0347 11/06/22 0507 11/06/22 0550 11/06/22 0731  BP: (!) 158/103 (!) 174/106 (!) 153/99 (!) 171/102  Pulse:  89 89 90  Resp:  17  16  Temp:  99.3 F (37.4 C)  98.6 F (37 C)  TempSrc:  Oral  Oral  SpO2: 100% 100% 100% 98%  Height:        Intake/Output Summary (Last 24 hours) at 11/06/2022 0741 Last data filed at 11/06/2022 0536 Gross per 24 hour  Intake 196.85 ml  Output 2700 ml  Net -2503.15 ml   Wt Readings from Last 3 Encounters:  11/01/22 90.7 kg  10/31/22 88.5 kg  10/17/22 96 kg    Physical examination: Body mass index is 36.58 kg/m (pended).  General: Obese built, not in obvious distress, slurred speech HENT:   No scleral pallor or icterus noted. Oral mucosa is moist.  Chest:  Clear breath sounds.  Diminished breath sounds bilaterally. No crackles or wheezes.  CVS: S1 &S2 heard. No murmur.  Regular rate and rhythm. Abdomen: Soft, nontender, nondistended.  Bowel sounds are heard.   Extremities: No cyanosis, clubbing or edema.  Peripheral pulses are palpable. Psych: Alert, awake and oriented, normal mood CNS:  No cranial nerve deficits.  Mild bilateral lower extremity weakness, dysarthria,  skin: Warm and dry.  No rashes noted.  Ulceration of the skin fold.  Data Reviewed: I have independently reviewed the following labs and imaging studies.  CBC Recent Labs  Lab 10/31/22 1315 11/04/22 0054 11/04/22 0101 11/04/22 0456 11/05/22 0639 11/06/22 0131  WBC 8.9 10.7*  --  11.2* 10.1 10.6*  HGB 13.4 10.7* 11.6* 10.3* 9.7* 10.3*  HCT 41.1 33.5* 34.0* 32.7* 29.2* 30.8*  PLT 382  364  --  353 357 380  MCV 79.5* 81.3  --  81.5 77.5* 77.8*  MCH 25.9* 26.0  --  25.7* 25.7* 26.0  MCHC 32.6 31.9  --  31.5 33.2 33.4  RDW 17.5* 17.3*  --  17.3* 16.8* 16.7*  LYMPHSABS 1.8 3.3  --   --   --   --   MONOABS 0.5 0.8  --   --   --   --   EOSABS 0.1 0.3  --   --   --   --   BASOSABS 0.0 0.1  --   --   --   --     Recent Labs  Lab 10/31/22 1315 11/01/22 1609 11/04/22 0054 11/04/22 0101 11/04/22 0456 11/04/22 0730 11/04/22 0910 11/05/22 0639 11/06/22 0131  NA 135 138 136 137 137  --   --  136 133*  K 4.3 4.4 4.0 4.1 3.9  --   --  3.7 3.7  CL 99 101 103 105 104  --   --  106 102  CO2 23 24 20*  --  20*  --   --  22 23  GLUCOSE 248* 217* 213* 214* 184*  --   --  156* 173*  BUN 39* 28* 30* 29* 28*  --   --  15 13  CREATININE 1.73* 1.01 1.83* 2.00* 1.68*  --   --  1.13* 0.98  CALCIUM 8.8* 9.9 8.3*  --  8.4*  --   --  8.6* 8.8*  AST 16  --  13*  --   --   --   --  11* 12*  ALT 21  --  14  --   --   --   --  13 14  ALKPHOS 90  --  71  --   --   --   --  68 73  BILITOT 0.5  --  0.3  --   --   --   --  0.4 0.4  ALBUMIN 3.5  --  2.9*  --   --   --   --  2.7* 3.0*  MG  --   --   --   --   --   --   --  1.5* 2.2  PROCALCITON  --   --   --   --   --  <0.10  --   --   --   LATICACIDVEN  --   --   --   --   --   --  2.7*  --   --   INR  --   --  1.1  --   --   --   --   --   --   TSH  --   --   --   --  0.547  --   --   --   --     ------------------------------------------------------------------------------------------------------------------ No results for input(s): "CHOL", "HDL", "LDLCALC", "TRIG", "CHOLHDL", "LDLDIRECT" in the last 72 hours.  Lab Results  Component Value Date   HGBA1C 9.8 (H) 08/27/2022   ------------------------------------------------------------------------------------------------------------------ Recent Labs    11/04/22 0456  TSH 0.547    Cardiac Enzymes No results for input(s): "CKMB", "TROPONINI", "MYOGLOBIN" in the last 168  hours.  Invalid input(s): "CK" ------------------------------------------------------------------------------------------------------------------    Component Value Date/Time   BNP 19.5 09/06/2022 2242    CBG: Recent Labs  Lab 11/05/22 2111 11/05/22 2301 11/06/22 0034 11/06/22 0510 11/06/22 0727  GLUCAP 230* 200* 184* 141* 133*    Recent Results (from the past 240 hour(s))  Urine Culture (for pregnant, neutropenic or urologic patients or patients with an indwelling urinary catheter)     Status: Abnormal (Preliminary result)   Collection Time: 11/04/22 11:10 AM   Specimen: Urine, Clean Catch  Result Value Ref Range Status   Specimen Description URINE, CLEAN CATCH  Final   Special Requests NONE  Final   Culture (A)  Final    80,000 COLONIES/mL ENTEROCOCCUS FAECALIS SUSCEPTIBILITIES TO FOLLOW 30,000 COLONIES/mL KLEBSIELLA PNEUMONIAE CULTURE REINCUBATED FOR BETTER GROWTH Performed at [facility], [address], [place], [place] [address]    Report Status PENDING  Incomplete  Culture, blood (Routine X 2) w Reflex to ID Panel     Status: None (Preliminary result)   Collection Time: 11/04/22  9:03 PM   Specimen: BLOOD  Result Value Ref Range Status   Specimen Description BLOOD FOREARM  Final   Special Requests   Final    BOTTLES DRAWN AEROBIC AND ANAEROBIC Blood Culture adequate volume   Culture   Final    NO GROWTH 2 DAYS Performed at [facility], [address], [place], [place] [address]    Report Status PENDING  Incomplete  Culture, blood (Routine X 2) w Reflex to ID Panel     Status: None (Preliminary result)   Collection Time: 11/04/22  9:11 PM   Specimen: BLOOD RIGHT ARM  Result Value Ref Range Status   Specimen Description BLOOD RIGHT ARM  Final   Special Requests   Final    BOTTLES DRAWN AEROBIC AND ANAEROBIC Blood Culture adequate volume   Culture   Final    NO GROWTH 2 DAYS Performed at [facility], [address], [place], [place]  [address]    Report Status PENDING  Incomplete     Radiology Studies: No results found.   Loistine Chance, MD [facility]

## 2022-11-06 NOTE — Plan of Care (Signed)
  Problem: Respiratory: Goal: Ability to maintain adequate ventilation will improve Outcome: Progressing   Problem: Health Behavior/Discharge Planning: Goal: Ability to safely manage health-related needs after discharge will improve Outcome: Progressing   Problem: Coping: Goal: Ability to adjust to condition or change in health will improve Outcome: Progressing   Problem: Health Behavior/Discharge Planning: Goal: Ability to identify and utilize available resources and services will improve Outcome: Progressing   Problem: Skin Integrity: Goal: Risk for impaired skin integrity will decrease Outcome: Progressing

## 2022-11-06 NOTE — Progress Notes (Signed)
PT Cancellation Note  Patient Details Name: Lori [name] MRN: [id number] DOB: 10-28-1970   Cancelled Treatment:    Reason Eval/Treat Not Completed: (P) Fatigue/lethargy limiting ability to participate, pt declining all mobility state she had a "rough" night and was not able to sleep, pt requesting this PTA to return tomorrow. Will check back as schedule allows to continue with PT POC.  Lenora Boys. PTA Acute Rehabilitation Services Office: [phone]    Catalina Antigua 11/06/2022, 4:04 PM

## 2022-11-06 NOTE — Plan of Care (Signed)
  Problem: Fluid Volume: Goal: Hemodynamic stability will improve Outcome: Progressing   Problem: Clinical Measurements: Goal: Diagnostic test results will improve Outcome: Progressing Goal: Signs and symptoms of infection will decrease Outcome: Progressing   Problem: Respiratory: Goal: Ability to maintain adequate ventilation will improve Outcome: Progressing   Problem: Education: Goal: Understanding of CV disease, CV risk reduction, and recovery process will improve Outcome: Progressing Goal: Individualized Educational Video(s) Outcome: Progressing   Problem: Activity: Goal: Ability to return to baseline activity level will improve Outcome: Progressing   Problem: Cardiovascular: Goal: Ability to achieve and maintain adequate cardiovascular perfusion will improve Outcome: Progressing Goal: Vascular access site(s) Level 0-1 will be maintained Outcome: Progressing   Problem: Health Behavior/Discharge Planning: Goal: Ability to safely manage health-related needs after discharge will improve Outcome: Progressing   Problem: Education: Goal: Ability to describe self-care measures that may prevent or decrease complications (Diabetes Survival Skills Education) will improve Outcome: Progressing Goal: Individualized Educational Video(s) Outcome: Progressing   Problem: Coping: Goal: Ability to adjust to condition or change in health will improve Outcome: Progressing   Problem: Fluid Volume: Goal: Ability to maintain a balanced intake and output will improve Outcome: Progressing   Problem: Health Behavior/Discharge Planning: Goal: Ability to identify and utilize available resources and services will improve Outcome: Progressing Goal: Ability to manage health-related needs will improve Outcome: Progressing   Problem: Metabolic: Goal: Ability to maintain appropriate glucose levels will improve Outcome: Progressing   Problem: Nutritional: Goal: Maintenance of adequate  nutrition will improve Outcome: Progressing Goal: Progress toward achieving an optimal weight will improve Outcome: Progressing   Problem: Skin Integrity: Goal: Risk for impaired skin integrity will decrease Outcome: Progressing   Problem: Tissue Perfusion: Goal: Adequacy of tissue perfusion will improve Outcome: Progressing   Problem: Education: Goal: Knowledge of General Education information will improve Description: Including pain rating scale, medication(s)/side effects and non-pharmacologic comfort measures Outcome: Progressing   Problem: Health Behavior/Discharge Planning: Goal: Ability to manage health-related needs will improve Outcome: Progressing   Problem: Clinical Measurements: Goal: Ability to maintain clinical measurements within normal limits will improve Outcome: Progressing Goal: Will remain free from infection Outcome: Progressing Goal: Diagnostic test results will improve Outcome: Progressing Goal: Respiratory complications will improve Outcome: Progressing Goal: Cardiovascular complication will be avoided Outcome: Progressing   Problem: Activity: Goal: Risk for activity intolerance will decrease Outcome: Progressing   Problem: Nutrition: Goal: Adequate nutrition will be maintained Outcome: Progressing   Problem: Coping: Goal: Level of anxiety will decrease Outcome: Progressing   Problem: Elimination: Goal: Will not experience complications related to bowel motility Outcome: Progressing Goal: Will not experience complications related to urinary retention Outcome: Progressing   Problem: Pain Managment: Goal: General experience of comfort will improve Outcome: Progressing   Problem: Safety: Goal: Ability to remain free from injury will improve Outcome: Progressing   Problem: Skin Integrity: Goal: Risk for impaired skin integrity will decrease Outcome: Progressing

## 2022-11-06 NOTE — Consult Note (Signed)
WOC Nurse Consult Note: Reason for Consult:intertriginous dermatitis to abdominal pannus.  Moisture associated skin damage.  Has Purewick external catheter in place, but skin is moist with urine on my assessment today. This extends up to abdominal pannus.  Abdomen is pendulous  Patient body habitus likely causing Purewick to slip.  Wound type: incontinence associated skin damage to skin fold, abdomen Pressure Injury POA: NA Measurement: 0.3 cm x 1 cm x 0.1 cm linear fissure Wound bed: pale pink moist Drainage (amount, consistency, odor) scant weeping  odor of urine Periwound: skin frequently moist. Patient is alert and oriented and able to call for assistance when soiled.  She mentioned to me " I am wet" and when I asked if she had notified staff she was wet she indicated she had not.  I instructed her to use the call bell to alert staff to the need for incontinence care and she was able to do that.  Dressing procedure/placement/frequency: Cleanse abdominal pannus with soap and water and pat dry. Apply Interdry skin fold management linen"  Order [facility] [place] # [phone] Measure and cut length of InterDry to fit in skin folds that have skin breakdown Tuck InterDry fabric into skin folds in a single layer, allow for 2 inches of overhang from skin edges to allow for wicking to occur May remove to bathe; dry area thoroughly and then tuck into affected areas again Do not apply any creams or ointments when using InterDry DO NOT THROW AWAY FOR 5 DAYS unless soiled with stool DO NOT [facility] product, this will inactivate the silver in the material  New sheet of Interdry should be applied after 5 days of use if patient continues to have skin breakdown   Will not follow at this time.  Please re-consult if needed.  Mike Gip MSN, RN, FNP-BC CWON Wound, Ostomy, Continence Nurse Outpatient [facility] [phone] Pager [phone]

## 2022-11-06 NOTE — Progress Notes (Addendum)
0218: Pt called the nurses station stating " her nose is bleeding". RN went to check on pt, RN applied pressure on bleeding> Pt complaining of headache. BP 171/103 map 122 HR 116. MD paged.  0221: BP 159/113 map 126. Pt still complaining of headache. Nosebleed stopped.  0224: BP 150/103 map 121.  0236: MD called RN, orders for metoprolol 100 mg PO gotten. Metoprolol PO administered. 0310: BP 158/105  map 121. 0347: BP 158/103 map 120. Orders for Labetalol 5mg  IV for SBP > 160 on MAR., SBP 158. MD notified, DBP > 100. MD stated "Continue to monitor blood pressure for now ". Plan of care ongoing.

## 2022-11-07 ENCOUNTER — Ambulatory Visit: Payer: 59

## 2022-11-07 DIAGNOSIS — N179 Acute kidney failure, unspecified: Secondary | ICD-10-CM | POA: Diagnosis not present

## 2022-11-07 DIAGNOSIS — G9340 Encephalopathy, unspecified: Secondary | ICD-10-CM | POA: Diagnosis not present

## 2022-11-07 DIAGNOSIS — Z7901 Long term (current) use of anticoagulants: Secondary | ICD-10-CM | POA: Diagnosis not present

## 2022-11-07 DIAGNOSIS — R112 Nausea with vomiting, unspecified: Secondary | ICD-10-CM | POA: Diagnosis not present

## 2022-11-07 LAB — URINE CULTURE: Culture: 80000 — AB

## 2022-11-07 LAB — GLUCOSE, CAPILLARY
Glucose-Capillary: 96 mg/dL (ref 70–99)
Glucose-Capillary: 122 mg/dL — ABNORMAL HIGH (ref 70–99)
Glucose-Capillary: 145 mg/dL — ABNORMAL HIGH (ref 70–99)

## 2022-11-07 NOTE — Care Management Important Message (Signed)
Important Message  Patient Details  Name: Lori [name] MRN: [id number] Date of Birth: 07-06-1970   Important Message Given:  Yes - Medicare IM Patient has a contact precaution order in place will mail a copy to the patient home address/.     Dorena Bodo 11/07/2022, 12:47 PM

## 2022-11-07 NOTE — TOC Transition Note (Addendum)
Transition of Care [facility]) - CM/SW Discharge Note   Patient Details  Name: Lori [name] MRN: [id number] Date of Birth: 11-Apr-1970  [facility] CM/SW Contact:  Janae Bridgeman, RN Phone Number: 11/07/2022, 11:14 AM   Clinical Narrative:    CM met with the patient at the bedside and patient is medically stable for discharge to home today.  Patient was seen by PT this am and Left arm Platform is needed for the patient's current walker at the home.  Medicare choice for DME was offered and patient does not have a preference.  I called [employer] and they will deliver a platform attachment for the RW that the patient already has at the home.  [facility] was notified that patient was discharging.  PT/ OT and MSW with [facility] will follow up in the home.  MSW was requested to assist for possible placement back on the CAPS list or increased hours for Medicaid.  Patient needs PTAR home per patient request.  PTAR will be arranged once discharge is placed.  11/07/2022 1134 - PTAR arranged for discharge to home.         Patient Goals and CMS Choice      Discharge Placement                         Discharge Plan and Services Additional resources added to the After Visit Summary for                                       Social Determinants of Health (SDOH) Interventions SDOH Screenings   Food Insecurity: No Food Insecurity (11/04/2022)  Housing: Patient Declined (11/04/2022)  Transportation Needs: No Transportation Needs (11/04/2022)  Utilities: Not At Risk (11/04/2022)  Depression (PHQ2-9): Low Risk  (11/01/2022)  Recent Concern: Depression (PHQ2-9) - Medium Risk (10/28/2022)  Tobacco Use: Low Risk  (11/04/2022)     Readmission Risk Interventions    11/05/2022    1:38 PM 10/15/2022   10:25 AM 08/29/2021    2:32 PM  Readmission Risk Prevention Plan  Transportation Screening Complete Complete Complete  Medication Review [employer]) Complete   Complete  PCP or Specialist appointment within 3-5 days of discharge Complete Complete Complete  HRI or Home Care Consult Complete Complete Complete  SW Recovery Care/Counseling Consult Complete Complete Complete  Palliative Care Screening Not Applicable Not Applicable Not Applicable  Skilled Nursing Facility Not Applicable Not Applicable Complete

## 2022-11-07 NOTE — Plan of Care (Signed)
  Problem: Fluid Volume: Goal: Hemodynamic stability will improve Outcome: Progressing   Problem: Clinical Measurements: Goal: Diagnostic test results will improve Outcome: Progressing Goal: Signs and symptoms of infection will decrease Outcome: Progressing   Problem: Respiratory: Goal: Ability to maintain adequate ventilation will improve Outcome: Progressing   Problem: Education: Goal: Understanding of CV disease, CV risk reduction, and recovery process will improve Outcome: Progressing Goal: Individualized Educational Video(s) Outcome: Progressing   Problem: Activity: Goal: Ability to return to baseline activity level will improve Outcome: Progressing   Problem: Cardiovascular: Goal: Ability to achieve and maintain adequate cardiovascular perfusion will improve Outcome: Progressing Goal: Vascular access site(s) Level 0-1 will be maintained Outcome: Progressing   Problem: Health Behavior/Discharge Planning: Goal: Ability to safely manage health-related needs after discharge will improve Outcome: Progressing   Problem: Education: Goal: Ability to describe self-care measures that may prevent or decrease complications (Diabetes Survival Skills Education) will improve Outcome: Progressing Goal: Individualized Educational Video(s) Outcome: Progressing   Problem: Coping: Goal: Ability to adjust to condition or change in health will improve Outcome: Progressing   Problem: Fluid Volume: Goal: Ability to maintain a balanced intake and output will improve Outcome: Progressing   Problem: Health Behavior/Discharge Planning: Goal: Ability to identify and utilize available resources and services will improve Outcome: Progressing Goal: Ability to manage health-related needs will improve Outcome: Progressing   Problem: Metabolic: Goal: Ability to maintain appropriate glucose levels will improve Outcome: Progressing   Problem: Nutritional: Goal: Maintenance of adequate  nutrition will improve Outcome: Progressing Goal: Progress toward achieving an optimal weight will improve Outcome: Progressing   Problem: Skin Integrity: Goal: Risk for impaired skin integrity will decrease Outcome: Progressing   Problem: Tissue Perfusion: Goal: Adequacy of tissue perfusion will improve Outcome: Progressing   Problem: Education: Goal: Knowledge of General Education information will improve Description: Including pain rating scale, medication(s)/side effects and non-pharmacologic comfort measures Outcome: Progressing   Problem: Health Behavior/Discharge Planning: Goal: Ability to manage health-related needs will improve Outcome: Progressing   Problem: Clinical Measurements: Goal: Ability to maintain clinical measurements within normal limits will improve Outcome: Progressing Goal: Will remain free from infection Outcome: Progressing Goal: Diagnostic test results will improve Outcome: Progressing Goal: Respiratory complications will improve Outcome: Progressing Goal: Cardiovascular complication will be avoided Outcome: Progressing   Problem: Activity: Goal: Risk for activity intolerance will decrease Outcome: Progressing   Problem: Nutrition: Goal: Adequate nutrition will be maintained Outcome: Progressing   Problem: Coping: Goal: Level of anxiety will decrease Outcome: Progressing   Problem: Elimination: Goal: Will not experience complications related to bowel motility Outcome: Progressing Goal: Will not experience complications related to urinary retention Outcome: Progressing   Problem: Pain Managment: Goal: General experience of comfort will improve Outcome: Progressing   Problem: Safety: Goal: Ability to remain free from injury will improve Outcome: Progressing   Problem: Skin Integrity: Goal: Risk for impaired skin integrity will decrease Outcome: Progressing

## 2022-11-07 NOTE — Discharge Summary (Signed)
Physician Discharge Summary  Lori [name] [facility] PIR:[id number] DOB: 10/14/1970 DOA: 11/03/2022  PCP: Caesar Bookman, NP  Admit date: 11/03/2022 Discharge date: 11/07/2022  Admitted From: Home  Discharge disposition: Home with home health  Recommendations for Outpatient Follow-Up:   Follow up with your primary care provider in one week.  Check CBC, BMP, magnesium in the next visit  Discharge Diagnosis:   Principal Problem:   Acute encephalopathy Active Problems:   Nausea & vomiting   AKI (acute kidney injury) (HCC)   History of CVA with spastic paresis (cerebrovascular accident)   Hyperthyroidism   Essential hypertension   Insulin dependent type 2 diabetes mellitus (HCC)   History of cerebral infarction   Left hemiplegia (HCC)   History of CAD (coronary artery disease)   Chronic heart failure with preserved ejection fraction (HFpEF) (HCC)   Chronic anticoagulation   Hypotension   Acute metabolic encephalopathy   Discharge Condition: Improved.  Diet recommendation: Low sodium, heart healthy.  Carbohydrate-modified.    Wound care: None.  Code status: Full.   History of Present Illness:   Patient is a 52 year old female with DM2, HTN, pheochromocytoma status post unilateral adrenalectomy in 2018, prior stroke with residual left-sided weakness, CAD status post CABG presented to hospital as code stroke with right-sided weakness.  Similar presentation in March 2024 with lethargy and AKI.  At that time, patient was noted to have polypharmacy causing worsening mentation and the pattern of AKI.  Patient was then admitted hospital for further evaluation and treatment. Hospital Course:   Following conditions were addressed during hospitalization as listed below,  Acute metabolic encephalopathy - Secondary to polypharmacy with AKI.  Improved at this time.  Blood cultures negative in 3 days.   Temperature max of 98.6 F.. Urine culture with 80,000 colonies of Enterococcus  faecalis.  Likely colonization.  Patient received 3-day course of antibiotic during hospitalization  which will be discontinued on discharge.   Epistaxis x 1.  Resolved   Hypotension now elevated blood pressure.-initially blood pressure was extremely low.  Had low-grade fever.  Patient had refused cultures.  Cortisol borderline low.  ACTH stimulation test with cortisol response at 16 and 30 minutes.  Received Decadron.  Had several courses of prednisone in the past.  Blood pressure has improved at this time.  History of unilateral adrenalectomy for pheochromocytoma.   Acute kidney injury -received IV fluids.  Creatinine has now improved to 0.9.   Hypomagnesemia-improved today after replacement.  Magnesium prior to discharge was 2.2.   Microcytic anemia-, anemia of chronic disease.  No obvious signs of bleeding.  Latest hemoglobin of 10.3.   Hyperthyroidism-on Tapazole.  TSH within normal range   Prior CVA -MRI negative for acute stroke, has chronic left-sided weakness.  Neurology saw the patient during hospitalization.  At this time plan for skilled nursing facility placement.   Chronic diastolic CHF-continue strict intake and output charting, daily weights.   History of CAD-continue Plavix   Chronic anticoagulation for DVT 2023-currently on prophylactic dose of anticoagulation with Xarelto..   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  Would benefit from weight loss as outpatient.   Polypharmacy -she wants to cut down as many medications as possible.  Will continue to wean  medications and discontinue Flexeril and Neurontin on discharge.  Has not been receiving this in the hospital.   Diabetes mellitus type 2 with hyperglycemia. Continue with sliding scale insulin, long-acting insulin.  Closely monitor blood glucose levels.  Disposition.  At this time, patient is stable  for disposition home with home health.  Spoke with the patient's spouse prior to disposition.  Medical Consultants:    Neurology  Procedures:    None Subjective:   Today, patient was seen and examined at bedside.  Feels okay.  Communicative.  Denies any nausea, vomiting fever chills or rigor.  Wants regular food.  Wishes to go home.  Discharge Exam:   Vitals:   11/07/22 0412 11/07/22 0811  BP: (!) 147/92 (!) 149/86  Pulse: 79 84  Resp: 17 18  Temp: 98.3 F (36.8 C) 97.8 F (36.6 C)  SpO2: 99% 99%   Vitals:   11/06/22 2102 11/06/22 2356 11/07/22 0412 11/07/22 0811  BP: (!) 141/78 138/86 (!) 147/92 (!) 149/86  Pulse: 92 78 79 84  Resp: 17 16 17 18   Temp: 98.6 F (37 C) 98.4 F (36.9 C) 98.3 F (36.8 C) 97.8 F (36.6 C)  TempSrc: Oral Oral Oral   SpO2: 96% 97% 99% 99%  Height:       Body mass index is 36.58 kg/m (pended).   General: Alert awake, not in obvious distress, obese built, mildly slurred speech HENT: pupils equally reacting to light,  No scleral pallor or icterus noted. Oral mucosa is moist.  Chest:  Diminished breath sounds bilaterally. No crackles or wheezes.  CVS: S1 &S2 heard. No murmur.  Regular rate and rhythm. Abdomen: Soft, nontender, nondistended.  Abdominal pannus noted.  Bowel sounds are heard.   Extremities: No cyanosis, clubbing or edema.  Peripheral pulses are palpable. Psych: Alert, awake and oriented, normal mood CNS:  No cranial nerve deficits.  Bilateral lower extremity weakness, dysarthria, Skin: Warm and dry.  Ulceration of the skin fold on the abdomen.  The results of significant diagnostics from this hospitalization (including imaging, microbiology, ancillary and laboratory) are listed below for reference.     Diagnostic Studies:   DG Chest Port 1 View  Result Date: 11/04/2022 CLINICAL DATA:  Shortness of breath EXAM: PORTABLE CHEST 1 VIEW COMPARISON:  10/31/2022 FINDINGS: Cardiac shadow is enlarged but stable. Postsurgical changes are again noted. Lungs are hypoinflated but clear. No bony abnormality is noted. IMPRESSION: No active disease.  Electronically Signed   By: Alcide Clever M.D.   On: 11/04/2022 03:06   MR BRAIN WO CONTRAST  Result Date: 11/04/2022 CLINICAL DATA:  Initial evaluation for neuro deficit, stroke suspected. EXAM: MRI HEAD WITHOUT CONTRAST TECHNIQUE: Multiplanar, multiecho pulse sequences of the brain and surrounding structures were obtained without intravenous contrast. COMPARISON:  Prior study from 11/03/2022 as well as earlier exams. FINDINGS: Brain: Mildly advanced cerebral atrophy for age. Patchy and confluent T2/FLAIR hyperintensity involving the periventricular and deep white matter both cerebral hemispheres, consistent with chronic small vessel ischemic disease, moderately advanced in nature. Multiple scattered remote lacunar infarcts present about the deep gray nuclei and pons. Remote left PICA territory infarct involving the inferior left cerebellum. Small remote left frontal infarct involving the left frontal operculum. No evidence for acute or subacute ischemia. Gray-white matter differentiation maintained. No other areas of chronic cortical infarction. No acute intracranial hemorrhage. Multiple scattered chronic micro hemorrhages noted clustered about the brainstem and deep gray nuclei, most characteristic of chronic poorly controlled hypertension. No mass lesion, midline shift or mass effect. No hydrocephalus or extra-axial fluid collection. Pituitary gland suprasellar region within normal limits. Vascular: Absent flow void within the left V4 segment, which could be related to slow flow and/or occlusion, stable. Major intracranial vascular flow voids are otherwise maintained. Skull and upper cervical spine:  Craniocervical junction within normal limits. Bone marrow signal intensity normal. No scalp soft tissue abnormality. Sinuses/Orbits: 1 cm well-circumscribed FLAIR hyperintense lesion noted at the superior aspect of the left orbit, along the superior margin of the left lacrimal gland (series 5, image 14),  indeterminate, but relatively stable from prior exams. Globes and orbital soft tissues otherwise unremarkable. Paranasal sinuses are clear. No significant mastoid effusion. Other: None. IMPRESSION: 1. No acute intracranial abnormality. 2. Mildly advanced cerebral atrophy with moderately advanced chronic microvascular ischemic disease, with multiple remote lacunar infarcts involving the deep gray nuclei and pons. Remote left PICA territory infarct, with additional small remote left frontal infarct. 3. Multiple scattered chronic micro hemorrhages clustered about the brainstem and deep gray nuclei, most characteristic of chronic poorly controlled hypertension. 4. 1 cm well-circumscribed FLAIR hyperintense lesion at the superior aspect of the left orbit, indeterminate, but relatively stable from prior exams. Nonemergent outpatient ophthalmology referral suggested for further workup and evaluation. Electronically Signed   By: Rise Mu M.D.   On: 11/04/2022 01:36   CT HEAD CODE STROKE WO CONTRAST  Result Date: 11/03/2022 CLINICAL DATA:  Code stroke. Initial evaluation for neuro deficit, stroke. EXAM: CT HEAD WITHOUT CONTRAST TECHNIQUE: Contiguous axial images were obtained from the base of the skull through the vertex without intravenous contrast. RADIATION DOSE REDUCTION: This exam was performed according to the departmental dose-optimization program which includes automated exposure control, adjustment of the mA and/or kV according to patient size and/or use of iterative reconstruction technique. COMPARISON:  Prior CT from 05/22/2022. FINDINGS: Brain: Generalized age-related cerebral atrophy with advanced chronic microvascular ischemic disease. Scattered remote lacunar infarcts present about the deep gray nuclei. Chronic left frontal infarct. Chronic left cerebellar infarct. No acute intracranial hemorrhage. There is question of parenchymal hypodensity involving the superior right cerebellar hemisphere  (series 5, image 23). Finding is not entirely certain, and could be artifactual. No other acute large vessel territory infarct. No mass lesion or midline shift. No hydrocephalus or extra-axial fluid collection. Vascular: No abnormal hyperdense vessel. Calcified atherosclerosis present at skull base. Skull: Scalp soft tissues within normal limits.  Calvarium intact. Sinuses/Orbits: Globes and orbital soft tissues within normal limits. Paranasal sinuses and mastoid air cells are clear. Other: None. ASPECTS [facility] Stroke Program Early CT Score) - Ganglionic level infarction (caudate, lentiform nuclei, internal capsule, insula, M1-M3 cortex): 7 - Supraganglionic infarction (M4-M6 cortex): 3 Total score (0-10 with 10 being normal): 10 IMPRESSION: 1. Question parenchymal hypodensity involving the superior right cerebellar hemisphere. While this finding could be artifactual nature, a possible evolving ischemic infarct is difficult to exclude. Correlation with dedicated MRI could be performed for further evaluation as warranted. No intracranial hemorrhage. 2. No other acute intracranial abnormality. 3. Aspects is 10. 4. Underlying atrophy with chronic small vessel ischemic disease with multiple chronic infarcts as above. These results were communicated to Dr. Derry Lory at 11:37 pm on 11/03/2022 by text page via the [facility] messaging system. Electronically Signed   By: Rise Mu M.D.   On: 11/03/2022 23:38     Labs:   Basic Metabolic Panel: Recent Labs  Lab 11/01/22 1609 11/04/22 0054 11/04/22 0101 11/04/22 0456 11/05/22 0639 11/06/22 0131  NA 138 136 137 137 136 133*  K 4.4 4.0 4.1 3.9 3.7 3.7  CL 101 103 105 104 106 102  CO2 24 20*  --  20* 22 23  GLUCOSE 217* 213* 214* 184* 156* 173*  BUN 28* 30* 29* 28* 15 13  CREATININE 1.01 1.83* 2.00* 1.68*  1.13* 0.98  CALCIUM 9.9 8.3*  --  8.4* 8.6* 8.8*  MG  --   --   --   --  1.5* 2.2   GFR Estimated Creatinine Clearance: 71.9 mL/min (by C-G  formula based on SCr of 0.98 mg/dL). Liver Function Tests: Recent Labs  Lab 11/04/22 0054 11/05/22 0639 11/06/22 0131  AST 13* 11* 12*  ALT 14 13 14   ALKPHOS 71 68 73  BILITOT 0.3 0.4 0.4  PROT 6.1* 6.2* 6.6  ALBUMIN 2.9* 2.7* 3.0*   Recent Labs  Lab 11/04/22 0456  LIPASE 19   No results for input(s): "AMMONIA" in the last 168 hours. Coagulation profile Recent Labs  Lab 11/04/22 0054  INR 1.1    CBC: Recent Labs  Lab 11/04/22 0054 11/04/22 0101 11/04/22 0456 11/05/22 0639 11/06/22 0131  WBC 10.7*  --  11.2* 10.1 10.6*  NEUTROABS 6.2  --   --   --   --   HGB 10.7* 11.6* 10.3* 9.7* 10.3*  HCT 33.5* 34.0* 32.7* 29.2* 30.8*  MCV 81.3  --  81.5 77.5* 77.8*  PLT 364  --  353 357 380   Cardiac Enzymes: No results for input(s): "CKTOTAL", "CKMB", "CKMBINDEX", "TROPONINI" in the last 168 hours. BNP: Invalid input(s): "POCBNP" CBG: Recent Labs  Lab 11/06/22 2034 11/06/22 2355 11/07/22 0409 11/07/22 0809 11/07/22 1229  GLUCAP 195* 136* 96 122* 145*   D-Dimer No results for input(s): "DDIMER" in the last 72 hours. Hgb A1c No results for input(s): "HGBA1C" in the last 72 hours. Lipid Profile No results for input(s): "CHOL", "HDL", "LDLCALC", "TRIG", "CHOLHDL", "LDLDIRECT" in the last 72 hours. Thyroid function studies No results for input(s): "TSH", "T4TOTAL", "T3FREE", "THYROIDAB" in the last 72 hours.  Invalid input(s): "FREET3" Anemia work up No results for input(s): "VITAMINB12", "FOLATE", "FERRITIN", "TIBC", "IRON", "RETICCTPCT" in the last 72 hours. Microbiology Recent Results (from the past 240 hour(s))  Urine Culture (for pregnant, neutropenic or urologic patients or patients with an indwelling urinary catheter)     Status: Abnormal   Collection Time: 11/04/22 11:10 AM   Specimen: Urine, Clean Catch  Result Value Ref Range Status   Specimen Description URINE, CLEAN CATCH  Final   Special Requests   Final    NONE Performed at [facility], [address] [place], [place] [address]    Culture (A)  Final    80,000 COLONIES/mL ENTEROCOCCUS FAECALIS 30,000 COLONIES/mL KLEBSIELLA PNEUMONIAE Confirmed Extended Spectrum Beta-Lactamase Producer (ESBL).  In bloodstream infections from ESBL organisms, carbapenems are preferred over piperacillin/tazobactam. They are shown to have a lower risk of mortality.    Report Status 11/07/2022 FINAL  Final   Organism ID, Bacteria ENTEROCOCCUS FAECALIS (A)  Final   Organism ID, Bacteria KLEBSIELLA PNEUMONIAE (A)  Final      Susceptibility   Enterococcus faecalis - MIC*    AMPICILLIN <=2 SENSITIVE Sensitive     NITROFURANTOIN <=16 SENSITIVE Sensitive     VANCOMYCIN 1 SENSITIVE Sensitive     * 80,000 COLONIES/mL ENTEROCOCCUS FAECALIS   Klebsiella pneumoniae - MIC*    AMPICILLIN >=32 RESISTANT Resistant     CEFAZOLIN >=64 RESISTANT Resistant     CEFEPIME 2 SENSITIVE Sensitive     CEFTRIAXONE >=64 RESISTANT Resistant     CIPROFLOXACIN <=0.25 SENSITIVE Sensitive     GENTAMICIN <=1 SENSITIVE Sensitive     IMIPENEM <=0.25 SENSITIVE Sensitive     NITROFURANTOIN 32 SENSITIVE Sensitive     TRIMETH/SULFA >=320 RESISTANT Resistant  AMPICILLIN/SULBACTAM >=32 RESISTANT Resistant     PIP/TAZO 32 INTERMEDIATE Intermediate     * 30,000 COLONIES/mL KLEBSIELLA PNEUMONIAE  Culture, blood (Routine X 2) w Reflex to ID Panel     Status: None (Preliminary result)   Collection Time: 11/04/22  9:03 PM   Specimen: BLOOD  Result Value Ref Range Status   Specimen Description BLOOD FOREARM  Final   Special Requests   Final    BOTTLES DRAWN AEROBIC AND ANAEROBIC Blood Culture adequate volume   Culture   Final    NO GROWTH 3 DAYS Performed at [facility], [address] [place], [place] [address]    Report Status PENDING  Incomplete  Culture, blood (Routine X 2) w Reflex to ID Panel     Status: None (Preliminary result)   Collection Time: 11/04/22  9:11 PM   Specimen: BLOOD RIGHT ARM  Result Value  Ref Range Status   Specimen Description BLOOD RIGHT ARM  Final   Special Requests   Final    BOTTLES DRAWN AEROBIC AND ANAEROBIC Blood Culture adequate volume   Culture   Final    NO GROWTH 3 DAYS Performed at [facility], [address] [place], [place] [address]    Report Status PENDING  Incomplete     Discharge Instructions:   Discharge Instructions     Call MD for:  persistant nausea and vomiting   Complete by: As directed    Call MD for:  severe uncontrolled pain   Complete by: As directed    Call MD for:  temperature >100.4   Complete by: As directed    Discharge instructions   Complete by: As directed    Follow-up with your primary care provider in 1 week.  Check blood work at that time.  Seek medical attention for worsening symptoms.   Discharge wound care:   Complete by: As directed    Wound care at home.   Increase activity slowly   Complete by: As directed       Allergies as of 11/07/2022       Reactions   Asa [aspirin] Anaphylaxis, Swelling   Throat closes   Contrast Media [iodinated Contrast Media] Anaphylaxis, Swelling   Imitrex [sumatriptan] Anaphylaxis   Penicillins Swelling   Mouth and eye swelling   Firvanq [vancomycin] Other (See Comments)   Red Man's Syndrome   Cipro [ciprofloxacin Hcl] Nausea And Vomiting   Ms Contin [morphine] Swelling   Wound Dressing Adhesive Itching, Rash        Medication List     STOP taking these medications    cyclobenzaprine 5 MG tablet Commonly known as: FLEXERIL   gabapentin 100 MG capsule Commonly known as: NEURONTIN       TAKE these medications    albuterol (2.5 MG/3ML) 0.083% nebulizer solution Commonly known as: PROVENTIL Take 3 mLs (2.5 mg total) by nebulization every 6 (six) hours as needed for wheezing or shortness of breath.   albuterol 108 (90 Base) MCG/ACT inhaler Commonly known as: VENTOLIN HFA INHALE 2 PUFFS BY MOUTH EVERY 6 HOURS AS NEEDED FOR WHEEZING AND FOR SHORTNESS OF  BREATH   atorvastatin 80 MG tablet Commonly known as: LIPITOR Take 1 tablet (80 mg total) by mouth daily. What changed: when to take this   baclofen 10 MG tablet Commonly known as: LIORESAL Take 1 tablet (10 mg total) by mouth 3 (three) times daily. X 1 week, then 10 mg TID-for spasticity- can increase  the  dose if you call me to discuss What changed: additional instructions   blood glucose meter kit and supplies 1 each by Other route as directed. Dispense based on patient and insurance preference. Use up to four times daily as directed. (FOR ICD-10 E10.9, E11.9).   budesonide 0.25 MG/2ML nebulizer solution Commonly known as: PULMICORT Take 2 mLs (0.25 mg total) by nebulization 2 (two) times daily.   clopidogrel 75 MG tablet Commonly known as: PLAVIX Take 1 tablet (75 mg total) by mouth daily.   dicyclomine 20 MG tablet Commonly known as: BENTYL Take 1 tablet (20 mg total) by mouth 2 (two) times daily.   escitalopram 20 MG tablet Commonly known as: LEXAPRO Take 1 tablet (20 mg total) by mouth every morning.   ezetimibe 10 MG tablet Commonly known as: Zetia Take 1 tablet (10 mg total) by mouth daily. What changed: when to take this   FreeStyle Libre 14 Day Sensor Misc 1 each by Does not apply route as needed. DX: E11.69   FreeStyle Libre 2 Reader Devi 1 Device by Does not apply route daily. E11.69   insulin aspart 100 UNIT/ML injection Commonly known as: novoLOG Inject 15 Units into the skin 3 (three) times daily with meals.   Insulin Pen Needle 32G X 4 MM Misc Use to inject insulin 4 times daily as directed.   Lantus SoloStar 100 UNIT/ML Solostar Pen Generic drug: insulin glargine INJECT 40 UNITS SUBCUTANEOUSLY AT BEDTIME What changed: See the new instructions.   LORazepam 0.5 MG tablet Commonly known as: ATIVAN Take 1 tablet (0.5 mg total) by mouth at bedtime.   methimazole 5 MG tablet Commonly known as: TAPAZOLE Take 1 tablet (5 mg total) by mouth every  morning.   metoprolol tartrate 100 MG tablet Commonly known as: LOPRESSOR Take 100 mg by mouth 2 (two) times daily.   nystatin powder Commonly known as: MYCOSTATIN/NYSTOP Apply 1 Application topically 2 (two) times daily as needed (irritation under breast/skin folds).   olmesartan-hydrochlorothiazide 40-25 MG tablet Commonly known as: BENICAR HCT Take 1 tablet by mouth daily.   omeprazole 20 MG capsule Commonly known as: PRILOSEC Take 1 capsule (20 mg total) by mouth daily.   Ozempic (0.25 or 0.5 MG/DOSE) 2 MG/1.5ML Sopn Generic drug: Semaglutide(0.25 or 0.5MG /DOS) Inject 0.5 mg into the skin once a week. What changed: when to take this   polyethylene glycol powder 17 GM/SCOOP powder Commonly known as: GLYCOLAX/MIRALAX Take 17 g by mouth daily. Hold for loose stool What changed:  when to take this reasons to take this additional instructions   ranolazine 500 MG 12 hr tablet Commonly known as: RANEXA Take 1 tablet (500 mg total) by mouth 2 (two) times daily.   rivaroxaban 10 MG Tabs tablet Commonly known as: XARELTO Take 1 tablet (10 mg total) by mouth daily.   senna-docusate 8.6-50 MG tablet Commonly known as: Senokot-S Take 1 tablet by mouth daily. What changed:  when to take this reasons to take this   topiramate 25 MG tablet Commonly known as: TOPAMAX Take 1 tablet (25 mg total) by mouth 2 (two) times daily.               Durable Medical Equipment  (From admission, onward)           Start     Ordered   11/07/22 1041  For home use only DME Other see comment  Once       Comments: Needs Left Arm Platform attachment for patient's RW that  she currently has at the home - please ship to the home.  Question:  Length of Need  Answer:  Lifetime   11/07/22 1041              Discharge Care Instructions  (From admission, onward)           Start     Ordered   11/07/22 0000  Discharge wound care:       Comments: Wound care at home.    11/07/22 1009            Follow-up Information     Care, [facility] Follow up.   Specialty: Home Health Services Why: [facility] will continue to provide home health services.  The facility will call you in the next 24-48 hours to set up services. Contact information: [address] [place] [address] [phone]         Care, [facility] Follow up.   Why: [employer] will deliver a platform to your home to be attached to the [employer]. Contact information: [address] [place] [address] [phone]         Ngetich, Dinah C, NP Follow up in 1 week(s).   Specialty: Family Medicine Contact information: [address] [place] [address] [phone]                  Time coordinating discharge: 39 minutes  Signed:  Sterling Mondo  [facility] 11/07/2022, 2:11 PM

## 2022-11-07 NOTE — Progress Notes (Signed)
Physical Therapy Treatment Patient Details Name: Lori [name] MRN: [id number] DOB: 23-Jul-1970 Today's Date: 11/07/2022   History of Present Illness 52yo female who presented to [facility] ED 11/04/22 as code stroke with reported R weakness, R facial droop, slurred speech. Per neuro consult, symptoms resolved with IVF and imaging negative for acute stroke, symptoms likely from hypotension rather than acute CVA. PMH CAD, depression, DM, HTN, CVA, thyroid disease, CABG    PT Comments  Pt greeted resting in bed and eager for mobility with continued progress towards acute goals. Pt demonstrating bed mobility with up to mod A to manage trunk and scoot out to EOB as pt with difficulty due to chronic L weakness. Pt able to power up to stand with min A and steadying assist provided at L elbow. With L elbow support and RUE on RW pt able to complete x2 bouts of marching in place with min a to steady and cues for weight shifting. Pt continues to benefit from step-by step cues for all mobility and exercise as pt with slow processing. Current plan remains appropriate to address deficits and maximize functional independence and decrease caregiver burden. Pt continues to benefit from skilled PT services to progress toward functional mobility goals.      If plan is discharge home, recommend the following: Assist for transportation;Assistance with feeding;Assistance with cooking/housework;Help with stairs or ramp for entrance;A lot of help with walking and/or transfers;A lot of help with bathing/dressing/bathroom   Can travel by private vehicle        Equipment Recommendations  Other (comment) (pt has hospital bed, wheelchair and RW, declining hoyer lift, would benefit from L platform attachment for RW)    Recommendations for Other Services       Precautions / Restrictions Precautions Precautions: Fall Precaution Comments: chronic L side weakness 2* CVA Restrictions Weight Bearing Restrictions: No Other  Position/Activity Restrictions: L sided weakness     Mobility  Bed Mobility Overal bed mobility: Needs Assistance Bed Mobility: Supine to Sit, Sit to Supine     Supine to sit: HOB elevated, Used rails, Mod assist Sit to supine: HOB elevated, Min assist   General bed mobility comments: Assist for LEs and trunk. Increased time and use of rails    Transfers Overall transfer level: Needs assistance Equipment used: Rolling walker (2 wheels) Transfers: Sit to/from Stand Sit to Stand: Min assist, From elevated surface           General transfer comment: min A to power up ans steady on rise providing support at L elbow    Ambulation/Gait             Pre-gait activities: standing marching x20 x 2 sets, pt able to take a few side steps toward [facility]     Stairs             Wheelchair Mobility     Tilt Bed    Modified Rankin (Stroke Patients Only)       Balance Overall balance assessment: Needs assistance Sitting-balance support: Feet unsupported, Single extremity supported Sitting balance-Leahy Scale: Poor     Standing balance support: Reliant on assistive device for balance, During functional activity, Single extremity supported Standing balance-Leahy Scale: Poor Standing balance comment: reliant on RUE and external support in standing                            Cognition Arousal: Alert Behavior During Therapy: [facility] for tasks assessed/performed Overall Cognitive  Status: Within Functional Limits for tasks assessed                                 General Comments: Expressive difficulties, pt spouse present and helpful/supportive throughout        Exercises General Exercises - Lower Extremity Long Arc Quad: AROM, Right, Left, 10 reps, Seated Hip Flexion/Marching: AROM, Right, Left, Seated, Standing    General Comments General comments (skin integrity, edema, etc.): VSS on RA, pt spouse present and supportive       Pertinent Vitals/Pain Pain Assessment Pain Assessment: Faces Faces Pain Scale: No hurt Pain Intervention(s): Monitored during session    Home Living                          Prior Function            PT Goals (current goals can now be found in the care plan section) Acute Rehab PT Goals Patient Stated Goal: go to home PT Goal Formulation: With patient Time For Goal Achievement: 11/18/22 Progress towards PT goals: Progressing toward goals    Frequency    Min 1X/week      PT Plan      Co-evaluation              AM-PAC PT "6 Clicks" Mobility   Outcome Measure  Help needed turning from your back to your side while in a flat bed without using bedrails?: A Lot Help needed moving from lying on your back to sitting on the side of a flat bed without using bedrails?: A Lot Help needed moving to and from a bed to a chair (including a wheelchair)?: A Lot Help needed standing up from a chair using your arms (e.g., wheelchair or bedside chair)?: A Lot Help needed to walk in hospital room?: A Lot Help needed climbing 3-5 steps with a railing? : Total 6 Click Score: 11    End of Session   Activity Tolerance: Patient tolerated treatment well;Patient limited by fatigue Patient left: in bed;with call bell/phone within reach;with family/visitor present Nurse Communication: Mobility status PT Visit Diagnosis: Pain;Hemiplegia and hemiparesis;Muscle weakness (generalized) (M62.81);History of falling (Z91.81);Difficulty in walking, not elsewhere classified (R26.2);Other abnormalities of gait and mobility (R26.89) Hemiplegia - Right/Left: Left Hemiplegia - dominant/non-dominant: Non-dominant Hemiplegia - caused by: Cerebral infarction     Time: 1001-1024 PT Time Calculation (min) (ACUTE ONLY): 23 min  Charges:    $Therapeutic Activity: 23-37 mins PT General Charges $$ ACUTE PT VISIT: 1 Visit                     Larina Lieurance R. PTA Acute Rehabilitation  Services Office: [phone]   Catalina Antigua 11/07/2022, 11:19 AM

## 2022-11-09 LAB — CULTURE, BLOOD (ROUTINE X 2)
Culture: NO GROWTH
Culture: NO GROWTH
Special Requests: ADEQUATE
Special Requests: ADEQUATE

## 2022-11-10 DIAGNOSIS — E896 Postprocedural adrenocortical (-medullary) hypofunction: Secondary | ICD-10-CM | POA: Diagnosis not present

## 2022-11-10 DIAGNOSIS — G9341 Metabolic encephalopathy: Secondary | ICD-10-CM | POA: Diagnosis not present

## 2022-11-10 DIAGNOSIS — E119 Type 2 diabetes mellitus without complications: Secondary | ICD-10-CM | POA: Diagnosis not present

## 2022-11-10 DIAGNOSIS — E0590 Thyrotoxicosis, unspecified without thyrotoxic crisis or storm: Secondary | ICD-10-CM | POA: Diagnosis not present

## 2022-11-10 DIAGNOSIS — Z7902 Long term (current) use of antithrombotics/antiplatelets: Secondary | ICD-10-CM | POA: Diagnosis not present

## 2022-11-10 DIAGNOSIS — I69351 Hemiplegia and hemiparesis following cerebral infarction affecting right dominant side: Secondary | ICD-10-CM | POA: Diagnosis not present

## 2022-11-10 DIAGNOSIS — I69354 Hemiplegia and hemiparesis following cerebral infarction affecting left non-dominant side: Secondary | ICD-10-CM | POA: Diagnosis not present

## 2022-11-10 DIAGNOSIS — Z951 Presence of aortocoronary bypass graft: Secondary | ICD-10-CM | POA: Diagnosis not present

## 2022-11-10 DIAGNOSIS — I5032 Chronic diastolic (congestive) heart failure: Secondary | ICD-10-CM | POA: Diagnosis not present

## 2022-11-10 DIAGNOSIS — I110 Hypertensive heart disease with heart failure: Secondary | ICD-10-CM | POA: Diagnosis not present

## 2022-11-10 DIAGNOSIS — I10 Essential (primary) hypertension: Secondary | ICD-10-CM | POA: Diagnosis not present

## 2022-11-10 DIAGNOSIS — N179 Acute kidney failure, unspecified: Secondary | ICD-10-CM | POA: Diagnosis not present

## 2022-11-10 DIAGNOSIS — Z7985 Long-term (current) use of injectable non-insulin antidiabetic drugs: Secondary | ICD-10-CM | POA: Diagnosis not present

## 2022-11-10 DIAGNOSIS — Z7951 Long term (current) use of inhaled steroids: Secondary | ICD-10-CM | POA: Diagnosis not present

## 2022-11-10 DIAGNOSIS — D509 Iron deficiency anemia, unspecified: Secondary | ICD-10-CM | POA: Diagnosis not present

## 2022-11-10 DIAGNOSIS — Z7901 Long term (current) use of anticoagulants: Secondary | ICD-10-CM | POA: Diagnosis not present

## 2022-11-10 DIAGNOSIS — Z794 Long term (current) use of insulin: Secondary | ICD-10-CM | POA: Diagnosis not present

## 2022-11-10 DIAGNOSIS — I2510 Atherosclerotic heart disease of native coronary artery without angina pectoris: Secondary | ICD-10-CM | POA: Diagnosis not present

## 2022-11-10 DIAGNOSIS — Z9181 History of falling: Secondary | ICD-10-CM | POA: Diagnosis not present

## 2022-11-11 ENCOUNTER — Telehealth: Payer: Self-pay

## 2022-11-11 NOTE — Transitions of Care (Post Inpatient/ED Visit) (Signed)
11/11/2022  Name: Lori [name] MRN: [id number] DOB: 1970-04-18  Today's TOC FU Call Status: Today's TOC FU Call Status:: Successful TOC FU Call Completed TOC FU Call Complete Date: 11/11/22 (Call completed with pt's aide per pt request) Patient's Name and Date of Birth confirmed.  Transition Care Management Follow-up Telephone Call Date of Discharge: 11/07/22 Discharge Facility: [facility]) Type of Discharge: Inpatient Admission Primary Inpatient Discharge Diagnosis:: "AKI" How have you been since you were released from the hospital?: Better (Aide currently bathing pt-states she helps pt with ADLs/IADLs-voices that pt not drinking enough water daily-only about 2-3 bottles of water-urine is dark and a little cloudy-she will call [facility] to see about obtaining urine sample) Any questions or concerns?: No  Items Reviewed: Did you receive and understand the discharge instructions provided?: Yes Medications obtained,verified, and reconciled?: No Medications Not Reviewed Reasons:: Other: (aide unable to review meds as she is bathing pt-states spouse manages meds for pt-confrimed they are not adminsitering discontinued meds) Any new allergies since your discharge?: No Dietary orders reviewed?: Yes Type of Diet Ordered:: low salt/heart healthy/carb modified Do you have support at home?: Yes People in Home: spouse Name of Support/Comfort Primary Source: aide in the home for several hrs per day  Medications Reviewed Today: Medications Reviewed Today     Reviewed by Charlyn Minerva, RN (Registered Nurse) on 11/11/22 at 1526  Med List Status: <None>   Medication Order Taking? Sig Documenting Provider Last Dose Status Informant  albuterol (PROVENTIL) (2.5 MG/3ML) 0.083% nebulizer solution [id number] No Take 3 mLs (2.5 mg total) by nebulization every 6 (six) hours as needed for wheezing or shortness of breath. Ngetich, Donalee Citrin, NP 11/03/2022 Active Self, Pharmacy Records   albuterol (VENTOLIN HFA) 108 (90 Base) MCG/ACT inhaler [id number] No INHALE 2 PUFFS BY MOUTH EVERY 6 HOURS AS NEEDED FOR WHEEZING AND FOR SHORTNESS OF BREATH Ngetich, Dinah C, NP 11/04/2022 Active Self, Pharmacy Records  atorvastatin (LIPITOR) 80 MG tablet [id number] No Take 1 tablet (80 mg total) by mouth daily.  Patient taking differently: Take 80 mg by mouth at bedtime.   Ngetich, Donalee Citrin, NP Past Week Active Self, Pharmacy Records  baclofen (LIORESAL) 10 MG tablet [id number] No Take 1 tablet (10 mg total) by mouth 3 (three) times daily. X 1 week, then 10 mg TID-for spasticity- can increase  the dose if you call me to discuss  Patient taking differently: Take 10 mg by mouth 3 (three) times daily.   Maurilio Lovely D, DO 11/03/2022 Active Self, Pharmacy Records  blood glucose meter kit and supplies [id number] No 1 each by Other route as directed. Dispense based on patient and insurance preference. Use up to four times daily as directed. (FOR ICD-10 E10.9, E11.9). Ngetich, Donalee Citrin, NP Taking Active Self, Pharmacy Records  budesonide (PULMICORT) 0.25 MG/2ML nebulizer solution [id number] No Take 2 mLs (0.25 mg total) by nebulization 2 (two) times daily. Ngetich, Dinah C, NP 11/03/2022 Active Self, Pharmacy Records  clopidogrel (PLAVIX) 75 MG tablet [id number] No Take 1 tablet (75 mg total) by mouth daily. Ngetich, Donalee Citrin, NP 11/03/2022 Active Self, Pharmacy Records  Continuous Glucose Receiver (FREESTYLE LIBRE 2 READER) DEVI [id number]  1 Device by Does not apply route daily. E11.69 Ngetich, Donalee Citrin, NP  Active Self, Pharmacy Records  Continuous Glucose Sensor (FREESTYLE LIBRE 14 DAY SENSOR) [place] [id number] No 1 each by Does not apply route as needed. DX: E11.69 Sharon Seller, NP Taking Active Self, Pharmacy Records  dicyclomine (BENTYL) 20  MG tablet [id number] No Take 1 tablet (20 mg total) by mouth 2 (two) times daily. Rexford Maus, DO 11/03/2022 Active Self, Pharmacy Records  escitalopram (LEXAPRO) 20  MG tablet [id number] No Take 1 tablet (20 mg total) by mouth every morning. Ngetich, Dinah Salena Saner, NP 11/03/2022 Active Self, Pharmacy Records  ezetimibe (ZETIA) 10 MG tablet [id number] No Take 1 tablet (10 mg total) by mouth daily.  Patient taking differently: Take 10 mg by mouth at bedtime.   Ngetich, Donalee Citrin, NP Past Week Active Self, Pharmacy Records  insulin aspart (NOVOLOG) 100 UNIT/ML injection [id number] No Inject 15 Units into the skin 3 (three) times daily with meals. Ngetich, Donalee Citrin, NP Past Week Active Self, Pharmacy Records  Insulin Pen Needle 32G X 4 MM MISC [id number] No Use to inject insulin 4 times daily as directed. Ngetich, Donalee Citrin, NP Taking Active Self, Pharmacy Records  LANTUS SOLOSTAR 100 UNIT/ML Solostar Pen [id number] No INJECT 40 UNITS SUBCUTANEOUSLY AT BEDTIME  Patient taking differently: Inject 40 Units into the skin at bedtime.   Ngetich, Donalee Citrin, NP Past Week Active Self, Pharmacy Records  LORazepam (ATIVAN) 0.5 MG tablet [id number] No Take 1 tablet (0.5 mg total) by mouth at bedtime. Ngetich, Donalee Citrin, NP Past Week Active Self, Pharmacy Records  methimazole (TAPAZOLE) 5 MG tablet [id number] No Take 1 tablet (5 mg total) by mouth every morning. Ngetich, Dinah C, NP 11/03/2022 Active Self, Pharmacy Records  metoprolol tartrate (LOPRESSOR) 100 MG tablet [id number] No Take 100 mg by mouth 2 (two) times daily. [provider] 11/03/2022 Active Self, Pharmacy Records  nystatin (MYCOSTATIN/NYSTOP) powder [id number] No Apply 1 Application topically 2 (two) times daily as needed (irritation under breast/skin folds). [provider] 11/03/2022 Active Self, Pharmacy Records  olmesartan-hydrochlorothiazide [facility] HCT) 40-25 MG tablet [id number] No Take 1 tablet by mouth daily. [provider] 11/03/2022 Active Self, Pharmacy Records  omeprazole (PRILOSEC) 20 MG capsule [id number] No Take 1 capsule (20 mg total) by mouth daily. Ngetich, Dinah Salena Saner, NP 11/03/2022 Active Self,  Pharmacy Records  polyethylene glycol powder (GLYCOLAX/MIRALAX) 17 GM/SCOOP powder [id number] No Take 17 g by mouth daily. Hold for loose stool  Patient taking differently: Take 17 g by mouth daily as needed (constipation).   Ngetich, Donalee Citrin, NP Past Week Active Self, Pharmacy Records  ranolazine (RANEXA) 500 MG 12 hr tablet [id number] No Take 1 tablet (500 mg total) by mouth 2 (two) times daily. [place], Eric J, DO 11/03/2022 Active Self, Pharmacy Records  rivaroxaban (XARELTO) 10 MG TABS tablet [id number] No Take 1 tablet (10 mg total) by mouth daily. Ngetich, Dinah C, NP 11/02/2022 AM Active Self, Pharmacy Records           Med Note (COFFELL, SABENA GISMONDI   Mon Nov 04, 2022 11:29 AM) Pt states she took this medication 2 days ago and that it's part of her morning medications. Pt stated she took all of her other morning medications yesterday, but not this one. Pt unable to provide exact time of last dose.  Semaglutide,0.25 or 0.5MG /DOS, (OZEMPIC, 0.25 OR 0.5 MG/DOSE,) 2 MG/1.5ML SOPN [id number] No Inject 0.5 mg into the skin once a week.  Patient taking differently: Inject 0.5 mg into the skin every Sunday.   Ngetich, Donalee Citrin, NP Past Month Active Self, Pharmacy Records  senna-docusate (SENOKOT-S) 8.6-50 MG tablet [id number] No Take 1 tablet by mouth daily.  Patient taking differently: Take 1 tablet by mouth daily as needed (constipation).   Rexford Maus, DO Past  Week Active Self, Pharmacy Records  topiramate (TOPAMAX) 25 MG tablet [id number] No Take 1 tablet (25 mg total) by mouth 2 (two) times daily. Maurilio Lovely D, DO 11/03/2022 Active Self, Pharmacy Records            Home Care and Equipment/Supplies: Were Home Health Services Ordered?: Yes [facility] of [facility] Health Agency:: [facility] Has Agency set up a time to come to your home?: Yes First Home Health Visit Date: 11/10/22 Any new equipment or medical supplies ordered?: Yes Name of Medical supply agency?: [employer] Were you  able to get the equipment/medical supplies?: No Do you have any questions related to the use of the equipment/supplies?:  (Aide does not believe DME was delivered -but will confer with spouse-she will call DME agency to arrange delivery)  Functional Questionnaire: Do you need assistance with bathing/showering or dressing?: Yes (family/aide assists with all ADLs/IADLs) Do you need assistance with meal preparation?: Yes Do you need assistance with eating?: Yes Do you have difficulty maintaining continence: Yes Do you need assistance with getting out of bed/getting out of a chair/moving?: Yes Do you have difficulty managing or taking your medications?: Yes  Follow up appointments reviewed: PCP Follow-up appointment confirmed?: Yes Date of PCP follow-up appointment?: 11/22/22 Follow-up Provider: [facility] Follow-up appointment confirmed?: NA Do you need transportation to your follow-up appointment?:  (aide confirms that spouse takes her to appts) Do you understand care options if your condition(s) worsen?: Yes-patient verbalized understanding  SDOH Interventions Today    Flowsheet Row Most Recent Value  SDOH Interventions   Food Insecurity Interventions Intervention Not Indicated  Transportation Interventions Intervention Not Indicated      TOC Interventions Today    Flowsheet Row Most Recent Value  TOC Interventions   TOC Interventions Discussed/Reviewed TOC Interventions Discussed      Interventions Today    Flowsheet Row Most Recent Value  Chronic Disease   Chronic disease during today's visit Diabetes, Other  [CVA]  General Interventions   General Interventions Discussed/Reviewed General Interventions Discussed, Doctor Visits, Referral to Nurse  [declined]  Doctor Visits Discussed/Reviewed PCP, Doctor Visits Discussed  PCP/Specialist Visits Compliance with follow-up visit  Education Interventions   Education Provided Provided Education   Provided Verbal Education On Nutrition, Medication, When to see the doctor, Other  [sx mgmt]  Pharmacy Interventions   Pharmacy Dicussed/Reviewed Pharmacy Topics Discussed, Medications and their functions  Safety Interventions   Safety Discussed/Reviewed Safety Discussed       Antionette Fairy, RN,BSN,CCM RN Care Manager Transitions of Care  [place]Population [facility]  Direct Phone: [phone] Toll Free: [phone] Fax: [phone]

## 2022-11-11 NOTE — Transitions of Care (Post Inpatient/ED Visit) (Signed)
11/11/2022  Name: Lori [name] MRN: [id number] DOB: 02-22-1970  Today's TOC FU Call Status: Today's TOC FU Call Status:: Unsuccessful Call (1st Attempt) Unsuccessful Call (1st Attempt) Date: 11/11/22  Attempted to reach the patient regarding the most recent Inpatient/ED visit.Pt reported she "feels like UTI is clearing up some" but states she still has some burning at times. RN CM attempted to complete TOC assessment-pt requested call back after 2:30pm when aide is in the home and better able to answer questions.   Follow Up Plan: Additional outreach attempts will be made to reach the patient to complete the Transitions of Care (Post Inpatient/ED visit) call.     Antionette Fairy, RN,BSN,CCM RN Care Manager Transitions of Care  [place]  Direct Phone: [phone] Toll Free: [phone] Fax: [phone]

## 2022-11-12 DIAGNOSIS — Z794 Long term (current) use of insulin: Secondary | ICD-10-CM | POA: Diagnosis not present

## 2022-11-12 DIAGNOSIS — I69354 Hemiplegia and hemiparesis following cerebral infarction affecting left non-dominant side: Secondary | ICD-10-CM | POA: Diagnosis not present

## 2022-11-12 DIAGNOSIS — I2510 Atherosclerotic heart disease of native coronary artery without angina pectoris: Secondary | ICD-10-CM | POA: Diagnosis not present

## 2022-11-12 DIAGNOSIS — E896 Postprocedural adrenocortical (-medullary) hypofunction: Secondary | ICD-10-CM | POA: Diagnosis not present

## 2022-11-12 DIAGNOSIS — Z7902 Long term (current) use of antithrombotics/antiplatelets: Secondary | ICD-10-CM | POA: Diagnosis not present

## 2022-11-12 DIAGNOSIS — Z951 Presence of aortocoronary bypass graft: Secondary | ICD-10-CM | POA: Diagnosis not present

## 2022-11-12 DIAGNOSIS — I5032 Chronic diastolic (congestive) heart failure: Secondary | ICD-10-CM | POA: Diagnosis not present

## 2022-11-12 DIAGNOSIS — E119 Type 2 diabetes mellitus without complications: Secondary | ICD-10-CM | POA: Diagnosis not present

## 2022-11-12 DIAGNOSIS — Z9181 History of falling: Secondary | ICD-10-CM | POA: Diagnosis not present

## 2022-11-12 DIAGNOSIS — Z7901 Long term (current) use of anticoagulants: Secondary | ICD-10-CM | POA: Diagnosis not present

## 2022-11-12 DIAGNOSIS — I69351 Hemiplegia and hemiparesis following cerebral infarction affecting right dominant side: Secondary | ICD-10-CM | POA: Diagnosis not present

## 2022-11-12 DIAGNOSIS — E0590 Thyrotoxicosis, unspecified without thyrotoxic crisis or storm: Secondary | ICD-10-CM | POA: Diagnosis not present

## 2022-11-12 DIAGNOSIS — G9341 Metabolic encephalopathy: Secondary | ICD-10-CM | POA: Diagnosis not present

## 2022-11-12 DIAGNOSIS — N179 Acute kidney failure, unspecified: Secondary | ICD-10-CM | POA: Diagnosis not present

## 2022-11-12 DIAGNOSIS — Z7985 Long-term (current) use of injectable non-insulin antidiabetic drugs: Secondary | ICD-10-CM | POA: Diagnosis not present

## 2022-11-12 DIAGNOSIS — Z7951 Long term (current) use of inhaled steroids: Secondary | ICD-10-CM | POA: Diagnosis not present

## 2022-11-12 DIAGNOSIS — D509 Iron deficiency anemia, unspecified: Secondary | ICD-10-CM | POA: Diagnosis not present

## 2022-11-12 DIAGNOSIS — I110 Hypertensive heart disease with heart failure: Secondary | ICD-10-CM | POA: Diagnosis not present

## 2022-11-12 DIAGNOSIS — I10 Essential (primary) hypertension: Secondary | ICD-10-CM | POA: Diagnosis not present

## 2022-11-14 ENCOUNTER — Other Ambulatory Visit: Payer: Self-pay | Admitting: Physical Medicine and Rehabilitation

## 2022-11-14 DIAGNOSIS — I69354 Hemiplegia and hemiparesis following cerebral infarction affecting left non-dominant side: Secondary | ICD-10-CM | POA: Diagnosis not present

## 2022-11-14 DIAGNOSIS — Z7902 Long term (current) use of antithrombotics/antiplatelets: Secondary | ICD-10-CM | POA: Diagnosis not present

## 2022-11-14 DIAGNOSIS — I5032 Chronic diastolic (congestive) heart failure: Secondary | ICD-10-CM | POA: Diagnosis not present

## 2022-11-14 DIAGNOSIS — Z9181 History of falling: Secondary | ICD-10-CM | POA: Diagnosis not present

## 2022-11-14 DIAGNOSIS — Z7901 Long term (current) use of anticoagulants: Secondary | ICD-10-CM | POA: Diagnosis not present

## 2022-11-14 DIAGNOSIS — G9341 Metabolic encephalopathy: Secondary | ICD-10-CM | POA: Diagnosis not present

## 2022-11-14 DIAGNOSIS — E0590 Thyrotoxicosis, unspecified without thyrotoxic crisis or storm: Secondary | ICD-10-CM | POA: Diagnosis not present

## 2022-11-14 DIAGNOSIS — I110 Hypertensive heart disease with heart failure: Secondary | ICD-10-CM | POA: Diagnosis not present

## 2022-11-14 DIAGNOSIS — N179 Acute kidney failure, unspecified: Secondary | ICD-10-CM | POA: Diagnosis not present

## 2022-11-14 DIAGNOSIS — I10 Essential (primary) hypertension: Secondary | ICD-10-CM | POA: Diagnosis not present

## 2022-11-14 DIAGNOSIS — E119 Type 2 diabetes mellitus without complications: Secondary | ICD-10-CM | POA: Diagnosis not present

## 2022-11-14 DIAGNOSIS — I69351 Hemiplegia and hemiparesis following cerebral infarction affecting right dominant side: Secondary | ICD-10-CM | POA: Diagnosis not present

## 2022-11-14 DIAGNOSIS — Z951 Presence of aortocoronary bypass graft: Secondary | ICD-10-CM | POA: Diagnosis not present

## 2022-11-14 DIAGNOSIS — I2510 Atherosclerotic heart disease of native coronary artery without angina pectoris: Secondary | ICD-10-CM | POA: Diagnosis not present

## 2022-11-14 DIAGNOSIS — Z7951 Long term (current) use of inhaled steroids: Secondary | ICD-10-CM | POA: Diagnosis not present

## 2022-11-14 DIAGNOSIS — Z794 Long term (current) use of insulin: Secondary | ICD-10-CM | POA: Diagnosis not present

## 2022-11-14 DIAGNOSIS — E896 Postprocedural adrenocortical (-medullary) hypofunction: Secondary | ICD-10-CM | POA: Diagnosis not present

## 2022-11-14 DIAGNOSIS — Z7985 Long-term (current) use of injectable non-insulin antidiabetic drugs: Secondary | ICD-10-CM | POA: Diagnosis not present

## 2022-11-14 DIAGNOSIS — D509 Iron deficiency anemia, unspecified: Secondary | ICD-10-CM | POA: Diagnosis not present

## 2022-11-20 DIAGNOSIS — Z7901 Long term (current) use of anticoagulants: Secondary | ICD-10-CM | POA: Diagnosis not present

## 2022-11-20 DIAGNOSIS — D509 Iron deficiency anemia, unspecified: Secondary | ICD-10-CM | POA: Diagnosis not present

## 2022-11-20 DIAGNOSIS — E0590 Thyrotoxicosis, unspecified without thyrotoxic crisis or storm: Secondary | ICD-10-CM | POA: Diagnosis not present

## 2022-11-20 DIAGNOSIS — Z7951 Long term (current) use of inhaled steroids: Secondary | ICD-10-CM | POA: Diagnosis not present

## 2022-11-20 DIAGNOSIS — Z794 Long term (current) use of insulin: Secondary | ICD-10-CM | POA: Diagnosis not present

## 2022-11-20 DIAGNOSIS — I2510 Atherosclerotic heart disease of native coronary artery without angina pectoris: Secondary | ICD-10-CM | POA: Diagnosis not present

## 2022-11-20 DIAGNOSIS — E119 Type 2 diabetes mellitus without complications: Secondary | ICD-10-CM | POA: Diagnosis not present

## 2022-11-20 DIAGNOSIS — I110 Hypertensive heart disease with heart failure: Secondary | ICD-10-CM | POA: Diagnosis not present

## 2022-11-20 DIAGNOSIS — Z951 Presence of aortocoronary bypass graft: Secondary | ICD-10-CM | POA: Diagnosis not present

## 2022-11-20 DIAGNOSIS — G9341 Metabolic encephalopathy: Secondary | ICD-10-CM | POA: Diagnosis not present

## 2022-11-20 DIAGNOSIS — Z7985 Long-term (current) use of injectable non-insulin antidiabetic drugs: Secondary | ICD-10-CM | POA: Diagnosis not present

## 2022-11-20 DIAGNOSIS — N179 Acute kidney failure, unspecified: Secondary | ICD-10-CM | POA: Diagnosis not present

## 2022-11-20 DIAGNOSIS — I69351 Hemiplegia and hemiparesis following cerebral infarction affecting right dominant side: Secondary | ICD-10-CM | POA: Diagnosis not present

## 2022-11-20 DIAGNOSIS — Z7902 Long term (current) use of antithrombotics/antiplatelets: Secondary | ICD-10-CM | POA: Diagnosis not present

## 2022-11-20 DIAGNOSIS — E896 Postprocedural adrenocortical (-medullary) hypofunction: Secondary | ICD-10-CM | POA: Diagnosis not present

## 2022-11-20 DIAGNOSIS — I69354 Hemiplegia and hemiparesis following cerebral infarction affecting left non-dominant side: Secondary | ICD-10-CM | POA: Diagnosis not present

## 2022-11-20 DIAGNOSIS — Z9181 History of falling: Secondary | ICD-10-CM | POA: Diagnosis not present

## 2022-11-20 DIAGNOSIS — I5032 Chronic diastolic (congestive) heart failure: Secondary | ICD-10-CM | POA: Diagnosis not present

## 2022-11-20 DIAGNOSIS — I10 Essential (primary) hypertension: Secondary | ICD-10-CM | POA: Diagnosis not present

## 2022-11-21 ENCOUNTER — Emergency Department (HOSPITAL_COMMUNITY): Payer: 59

## 2022-11-21 ENCOUNTER — Inpatient Hospital Stay (HOSPITAL_COMMUNITY)
Admission: EM | Admit: 2022-11-21 | Discharge: 2022-12-06 | DRG: 070 | Disposition: A | Discharge status: Skilled Nursing Facility | Payer: 59 | Attending: Internal Medicine | Admitting: Internal Medicine

## 2022-11-21 DIAGNOSIS — I69320 Aphasia following cerebral infarction: Secondary | ICD-10-CM

## 2022-11-21 DIAGNOSIS — N179 Acute kidney failure, unspecified: Secondary | ICD-10-CM | POA: Diagnosis not present

## 2022-11-21 DIAGNOSIS — A419 Sepsis, unspecified organism: Secondary | ICD-10-CM | POA: Diagnosis not present

## 2022-11-21 DIAGNOSIS — Z9181 History of falling: Secondary | ICD-10-CM | POA: Diagnosis not present

## 2022-11-21 DIAGNOSIS — R112 Nausea with vomiting, unspecified: Secondary | ICD-10-CM | POA: Diagnosis present

## 2022-11-21 DIAGNOSIS — Z8673 Personal history of transient ischemic attack (TIA), and cerebral infarction without residual deficits: Secondary | ICD-10-CM | POA: Diagnosis not present

## 2022-11-21 DIAGNOSIS — I110 Hypertensive heart disease with heart failure: Secondary | ICD-10-CM | POA: Diagnosis not present

## 2022-11-21 DIAGNOSIS — G8194 Hemiplegia, unspecified affecting left nondominant side: Secondary | ICD-10-CM | POA: Diagnosis not present

## 2022-11-21 DIAGNOSIS — M549 Dorsalgia, unspecified: Secondary | ICD-10-CM | POA: Diagnosis present

## 2022-11-21 DIAGNOSIS — I2510 Atherosclerotic heart disease of native coronary artery without angina pectoris: Secondary | ICD-10-CM | POA: Diagnosis not present

## 2022-11-21 DIAGNOSIS — E1169 Type 2 diabetes mellitus with other specified complication: Secondary | ICD-10-CM

## 2022-11-21 DIAGNOSIS — I69321 Dysphasia following cerebral infarction: Secondary | ICD-10-CM

## 2022-11-21 DIAGNOSIS — D509 Iron deficiency anemia, unspecified: Secondary | ICD-10-CM | POA: Diagnosis not present

## 2022-11-21 DIAGNOSIS — E896 Postprocedural adrenocortical (-medullary) hypofunction: Secondary | ICD-10-CM | POA: Diagnosis not present

## 2022-11-21 DIAGNOSIS — K226 Gastro-esophageal laceration-hemorrhage syndrome: Secondary | ICD-10-CM | POA: Diagnosis present

## 2022-11-21 DIAGNOSIS — Z7902 Long term (current) use of antithrombotics/antiplatelets: Secondary | ICD-10-CM

## 2022-11-21 DIAGNOSIS — E785 Hyperlipidemia, unspecified: Secondary | ICD-10-CM | POA: Diagnosis not present

## 2022-11-21 DIAGNOSIS — Z881 Allergy status to other antibiotic agents status: Secondary | ICD-10-CM

## 2022-11-21 DIAGNOSIS — F32A Depression, unspecified: Secondary | ICD-10-CM | POA: Diagnosis present

## 2022-11-21 DIAGNOSIS — Z79899 Other long term (current) drug therapy: Secondary | ICD-10-CM

## 2022-11-21 DIAGNOSIS — I672 Cerebral atherosclerosis: Secondary | ICD-10-CM | POA: Diagnosis not present

## 2022-11-21 DIAGNOSIS — F419 Anxiety disorder, unspecified: Secondary | ICD-10-CM | POA: Diagnosis present

## 2022-11-21 DIAGNOSIS — I69351 Hemiplegia and hemiparesis following cerebral infarction affecting right dominant side: Secondary | ICD-10-CM | POA: Diagnosis not present

## 2022-11-21 DIAGNOSIS — Z8709 Personal history of other diseases of the respiratory system: Secondary | ICD-10-CM

## 2022-11-21 DIAGNOSIS — E1165 Type 2 diabetes mellitus with hyperglycemia: Secondary | ICD-10-CM | POA: Diagnosis not present

## 2022-11-21 DIAGNOSIS — K3184 Gastroparesis: Secondary | ICD-10-CM | POA: Diagnosis present

## 2022-11-21 DIAGNOSIS — I959 Hypotension, unspecified: Secondary | ICD-10-CM | POA: Diagnosis present

## 2022-11-21 DIAGNOSIS — Z91048 Other nonmedicinal substance allergy status: Secondary | ICD-10-CM

## 2022-11-21 DIAGNOSIS — Z951 Presence of aortocoronary bypass graft: Secondary | ICD-10-CM

## 2022-11-21 DIAGNOSIS — Z8249 Family history of ischemic heart disease and other diseases of the circulatory system: Secondary | ICD-10-CM

## 2022-11-21 DIAGNOSIS — I69354 Hemiplegia and hemiparesis following cerebral infarction affecting left non-dominant side: Secondary | ICD-10-CM | POA: Diagnosis not present

## 2022-11-21 DIAGNOSIS — Z808 Family history of malignant neoplasm of other organs or systems: Secondary | ICD-10-CM

## 2022-11-21 DIAGNOSIS — Z86018 Personal history of other benign neoplasm: Secondary | ICD-10-CM

## 2022-11-21 DIAGNOSIS — E872 Acidosis, unspecified: Secondary | ICD-10-CM | POA: Diagnosis not present

## 2022-11-21 DIAGNOSIS — R918 Other nonspecific abnormal finding of lung field: Secondary | ICD-10-CM | POA: Diagnosis not present

## 2022-11-21 DIAGNOSIS — I69322 Dysarthria following cerebral infarction: Secondary | ICD-10-CM

## 2022-11-21 DIAGNOSIS — E876 Hypokalemia: Secondary | ICD-10-CM | POA: Diagnosis not present

## 2022-11-21 DIAGNOSIS — Z1152 Encounter for screening for COVID-19: Secondary | ICD-10-CM

## 2022-11-21 DIAGNOSIS — I700 Atherosclerosis of aorta: Secondary | ICD-10-CM | POA: Diagnosis not present

## 2022-11-21 DIAGNOSIS — Z86718 Personal history of other venous thrombosis and embolism: Secondary | ICD-10-CM

## 2022-11-21 DIAGNOSIS — K219 Gastro-esophageal reflux disease without esophagitis: Secondary | ICD-10-CM | POA: Diagnosis present

## 2022-11-21 DIAGNOSIS — Z8719 Personal history of other diseases of the digestive system: Secondary | ICD-10-CM

## 2022-11-21 DIAGNOSIS — J4520 Mild intermittent asthma, uncomplicated: Secondary | ICD-10-CM | POA: Diagnosis present

## 2022-11-21 DIAGNOSIS — Z794 Long term (current) use of insulin: Secondary | ICD-10-CM | POA: Diagnosis not present

## 2022-11-21 DIAGNOSIS — E119 Type 2 diabetes mellitus without complications: Secondary | ICD-10-CM

## 2022-11-21 DIAGNOSIS — G9341 Metabolic encephalopathy: Principal | ICD-10-CM | POA: Diagnosis present

## 2022-11-21 DIAGNOSIS — E0590 Thyrotoxicosis, unspecified without thyrotoxic crisis or storm: Secondary | ICD-10-CM | POA: Diagnosis present

## 2022-11-21 DIAGNOSIS — R5381 Other malaise: Secondary | ICD-10-CM | POA: Diagnosis not present

## 2022-11-21 DIAGNOSIS — Z91041 Radiographic dye allergy status: Secondary | ICD-10-CM

## 2022-11-21 DIAGNOSIS — K449 Diaphragmatic hernia without obstruction or gangrene: Secondary | ICD-10-CM | POA: Diagnosis not present

## 2022-11-21 DIAGNOSIS — E11649 Type 2 diabetes mellitus with hypoglycemia without coma: Secondary | ICD-10-CM | POA: Diagnosis not present

## 2022-11-21 DIAGNOSIS — G43909 Migraine, unspecified, not intractable, without status migrainosus: Secondary | ICD-10-CM | POA: Diagnosis present

## 2022-11-21 DIAGNOSIS — R000 Tachycardia, unspecified: Secondary | ICD-10-CM | POA: Diagnosis present

## 2022-11-21 DIAGNOSIS — Z90710 Acquired absence of both cervix and uterus: Secondary | ICD-10-CM

## 2022-11-21 DIAGNOSIS — I5032 Chronic diastolic (congestive) heart failure: Secondary | ICD-10-CM | POA: Diagnosis present

## 2022-11-21 DIAGNOSIS — Z888 Allergy status to other drugs, medicaments and biological substances status: Secondary | ICD-10-CM

## 2022-11-21 DIAGNOSIS — Z8679 Personal history of other diseases of the circulatory system: Secondary | ICD-10-CM | POA: Diagnosis not present

## 2022-11-21 DIAGNOSIS — Z7901 Long term (current) use of anticoagulants: Secondary | ICD-10-CM

## 2022-11-21 DIAGNOSIS — Z880 Allergy status to penicillin: Secondary | ICD-10-CM

## 2022-11-21 DIAGNOSIS — R4701 Aphasia: Secondary | ICD-10-CM | POA: Diagnosis present

## 2022-11-21 DIAGNOSIS — E669 Obesity, unspecified: Secondary | ICD-10-CM | POA: Diagnosis present

## 2022-11-21 DIAGNOSIS — R1310 Dysphagia, unspecified: Secondary | ICD-10-CM | POA: Diagnosis not present

## 2022-11-21 DIAGNOSIS — E039 Hypothyroidism, unspecified: Secondary | ICD-10-CM | POA: Diagnosis present

## 2022-11-21 DIAGNOSIS — E860 Dehydration: Secondary | ICD-10-CM

## 2022-11-21 DIAGNOSIS — Z832 Family history of diseases of the blood and blood-forming organs and certain disorders involving the immune mechanism: Secondary | ICD-10-CM

## 2022-11-21 DIAGNOSIS — R739 Hyperglycemia, unspecified: Secondary | ICD-10-CM | POA: Diagnosis not present

## 2022-11-21 DIAGNOSIS — R531 Weakness: Secondary | ICD-10-CM | POA: Diagnosis not present

## 2022-11-21 DIAGNOSIS — E861 Hypovolemia: Secondary | ICD-10-CM | POA: Diagnosis present

## 2022-11-21 DIAGNOSIS — Z825 Family history of asthma and other chronic lower respiratory diseases: Secondary | ICD-10-CM

## 2022-11-21 DIAGNOSIS — Z7985 Long-term (current) use of injectable non-insulin antidiabetic drugs: Secondary | ICD-10-CM | POA: Diagnosis not present

## 2022-11-21 DIAGNOSIS — Z7951 Long term (current) use of inhaled steroids: Secondary | ICD-10-CM

## 2022-11-21 DIAGNOSIS — Z833 Family history of diabetes mellitus: Secondary | ICD-10-CM

## 2022-11-21 DIAGNOSIS — E871 Hypo-osmolality and hyponatremia: Secondary | ICD-10-CM | POA: Diagnosis not present

## 2022-11-21 DIAGNOSIS — R410 Disorientation, unspecified: Secondary | ICD-10-CM | POA: Insufficient documentation

## 2022-11-21 DIAGNOSIS — Z98891 History of uterine scar from previous surgery: Secondary | ICD-10-CM

## 2022-11-21 DIAGNOSIS — I10 Essential (primary) hypertension: Secondary | ICD-10-CM | POA: Diagnosis present

## 2022-11-21 DIAGNOSIS — E1143 Type 2 diabetes mellitus with diabetic autonomic (poly)neuropathy: Secondary | ICD-10-CM | POA: Diagnosis not present

## 2022-11-21 DIAGNOSIS — Z4682 Encounter for fitting and adjustment of non-vascular catheter: Secondary | ICD-10-CM | POA: Diagnosis not present

## 2022-11-21 DIAGNOSIS — I9589 Other hypotension: Secondary | ICD-10-CM | POA: Diagnosis present

## 2022-11-21 DIAGNOSIS — R1111 Vomiting without nausea: Secondary | ICD-10-CM | POA: Diagnosis not present

## 2022-11-21 DIAGNOSIS — R4182 Altered mental status, unspecified: Secondary | ICD-10-CM

## 2022-11-21 DIAGNOSIS — Z885 Allergy status to narcotic agent status: Secondary | ICD-10-CM

## 2022-11-21 DIAGNOSIS — Z886 Allergy status to analgesic agent status: Secondary | ICD-10-CM

## 2022-11-21 DIAGNOSIS — Z818 Family history of other mental and behavioral disorders: Secondary | ICD-10-CM

## 2022-11-21 DIAGNOSIS — Z6836 Body mass index (BMI) 36.0-36.9, adult: Secondary | ICD-10-CM

## 2022-11-21 DIAGNOSIS — F411 Generalized anxiety disorder: Secondary | ICD-10-CM | POA: Diagnosis present

## 2022-11-21 DIAGNOSIS — Z993 Dependence on wheelchair: Secondary | ICD-10-CM

## 2022-11-21 DIAGNOSIS — Z87440 Personal history of urinary (tract) infections: Secondary | ICD-10-CM

## 2022-11-21 DIAGNOSIS — Z8701 Personal history of pneumonia (recurrent): Secondary | ICD-10-CM

## 2022-11-21 DIAGNOSIS — Z9889 Other specified postprocedural states: Secondary | ICD-10-CM

## 2022-11-21 LAB — CBG MONITORING, ED: Glucose-Capillary: 345 mg/dL — ABNORMAL HIGH (ref 70–99)

## 2022-11-21 NOTE — ED Triage Notes (Signed)
Pt bib [facility] coming from home. Called by husband/.caretaker. Called initially because he thought blood sugar was low. Since 5pm pt has not been speaking. EMS reports FD states that they have been called out multiple times (three times) and not speaking is baseline. Pt vomited multiple times on way to hospital. Responds to painful stimuli. EMS states she has not spoken at all. Pt is bed bound at baseline due to hx of previous stroke. EMS states they had to suction a few times during vomiting episode as patient cannot clear airway sometimes.  EMS vital signs: 22 left wrist  4 Zofran  Cbg 364 170/100 90 HR 98% RA  22 RR

## 2022-11-21 NOTE — ED Provider Notes (Signed)
[place] EMERGENCY DEPARTMENT AT [facility] Provider Note   CSN: [id number] Arrival date & time: 11/21/22  2300     History  No chief complaint on file.   Lori [name] is a 52 y.o. female.  Patient presents from home by EMS.  Husband told EMS that she has been confused and not acting like her normal self since 5 PM.  Patient has had a previous stroke but EMS was told that the patient can normally hold a conversation.  At arrival she can state her name but does not say anything else.       Home Medications Prior to Admission medications   Medication Sig Start Date End Date Taking? Authorizing Provider  albuterol (PROVENTIL) (2.5 MG/3ML) 0.083% nebulizer solution Take 3 mLs (2.5 mg total) by nebulization every 6 (six) hours as needed for wheezing or shortness of breath. 05/21/22   Ngetich, Dinah C, NP  albuterol (VENTOLIN HFA) 108 (90 Base) MCG/ACT inhaler INHALE 2 PUFFS BY MOUTH EVERY 6 HOURS AS NEEDED FOR WHEEZING AND FOR SHORTNESS OF BREATH 10/17/22   Ngetich, Dinah C, NP  atorvastatin (LIPITOR) 80 MG tablet Take 1 tablet (80 mg total) by mouth daily. Patient taking differently: Take 80 mg by mouth at bedtime. 09/02/22   Ngetich, Dinah C, NP  baclofen (LIORESAL) 10 MG tablet TAKE 1 TABLET BY MOUTH 3 TIMES DAILY AT 9AM, 1PM AND 5PM FOR SPASTICITY 11/18/22   Lovorn, Megan, MD  blood glucose meter kit and supplies 1 each by Other route as directed. Dispense based on patient and insurance preference. Use up to four times daily as directed. (FOR ICD-10 E10.9, E11.9). 06/24/22   Ngetich, Dinah C, NP  budesonide (PULMICORT) 0.25 MG/2ML nebulizer solution Take 2 mLs (0.25 mg total) by nebulization 2 (two) times daily. 05/21/22   Ngetich, Dinah C, NP  clopidogrel (PLAVIX) 75 MG tablet Take 1 tablet (75 mg total) by mouth daily. 09/02/22   Ngetich, Donalee Citrin, NP  Continuous Glucose Receiver (FREESTYLE LIBRE 2 READER) DEVI 1 Device by Does not apply route daily. E11.69 11/01/22    Ngetich, Dinah C, NP  Continuous Glucose Sensor (FREESTYLE LIBRE 14 DAY SENSOR) MISC 1 each by Does not apply route as needed. DX: E11.69 08/06/22   Sharon Seller, NP  cyclobenzaprine (FLEXERIL) 5 MG tablet TAKE 1 TABLET BY MOUTH 3 TIMES DAILY AS NEEDED FOR MUSCLE SPASMS 11/18/22   Lovorn, Aundra Millet, MD  dicyclomine (BENTYL) 20 MG tablet Take 1 tablet (20 mg total) by mouth 2 (two) times daily. 10/31/22   Elayne Snare K, DO  escitalopram (LEXAPRO) 20 MG tablet Take 1 tablet (20 mg total) by mouth every morning. 09/02/22   Ngetich, Dinah C, NP  ezetimibe (ZETIA) 10 MG tablet Take 1 tablet (10 mg total) by mouth daily. Patient taking differently: Take 10 mg by mouth at bedtime. 09/02/22   Ngetich, Dinah C, NP  insulin aspart (NOVOLOG) 100 UNIT/ML injection Inject 15 Units into the skin 3 (three) times daily with meals. 11/01/22   Ngetich, Dinah C, NP  Insulin Pen Needle 32G X 4 MM MISC Use to inject insulin 4 times daily as directed. 05/21/22   Ngetich, Dinah C, NP  LANTUS SOLOSTAR 100 UNIT/ML Solostar Pen INJECT 40 UNITS SUBCUTANEOUSLY AT BEDTIME Patient taking differently: Inject 40 Units into the skin at bedtime. 08/21/22   Ngetich, Dinah C, NP  LORazepam (ATIVAN) 0.5 MG tablet Take 1 tablet (0.5 mg total) by mouth at bedtime. 08/27/22   Ngetich,  Dinah C, NP  methimazole (TAPAZOLE) 5 MG tablet Take 1 tablet (5 mg total) by mouth every morning. 09/16/22   Ngetich, Dinah C, NP  metoprolol tartrate (LOPRESSOR) 100 MG tablet Take 100 mg by mouth 2 (two) times daily.    [provider]  nystatin (MYCOSTATIN/NYSTOP) powder Apply 1 Application topically 2 (two) times daily as needed (irritation under breast/skin folds).    [provider]  olmesartan-hydrochlorothiazide (BENICAR HCT) 40-25 MG tablet Take 1 tablet by mouth daily.    [provider]  omeprazole (PRILOSEC) 20 MG capsule Take 1 capsule (20 mg total) by mouth daily. 09/02/22   Ngetich, Dinah C, NP  polyethylene glycol  powder (GLYCOLAX/MIRALAX) 17 GM/SCOOP powder Take 17 g by mouth daily. Hold for loose stool Patient taking differently: Take 17 g by mouth daily as needed (constipation). 06/27/22   Ngetich, Dinah C, NP  ranolazine (RANEXA) 500 MG 12 hr tablet Take 1 tablet (500 mg total) by mouth 2 (two) times daily. 10/09/22 01/07/23  [place], Alvira Philips, DO  rivaroxaban (XARELTO) 10 MG TABS tablet Take 1 tablet (10 mg total) by mouth daily. 09/02/22   Ngetich, Dinah C, NP  Semaglutide,0.25 or 0.5MG /DOS, (OZEMPIC, 0.25 OR 0.5 MG/DOSE,) 2 MG/1.5ML SOPN Inject 0.5 mg into the skin once a week. Patient taking differently: Inject 0.5 mg into the skin every Sunday. 11/01/22   Ngetich, Dinah C, NP  senna-docusate (SENOKOT-S) 8.6-50 MG tablet Take 1 tablet by mouth daily. Patient taking differently: Take 1 tablet by mouth daily as needed (constipation). 10/31/22   Elayne Snare K, DO  topiramate (TOPAMAX) 25 MG tablet Take 1 tablet (25 mg total) by mouth 2 (two) times daily. 07/10/22   Sherryll Burger, Pratik D, DO      Allergies    Asa [aspirin], Contrast media [iodinated contrast media], Imitrex [sumatriptan], Penicillins, Firvanq [vancomycin], Cipro [ciprofloxacin hcl], Ms contin [morphine], and Wound dressing adhesive    Review of Systems   Review of Systems  Physical Exam Updated Vital Signs BP 102/77   Pulse (!) 126   Temp 97.8 F (36.6 C) (Oral)   Resp 18   SpO2 99%  Physical Exam Vitals and nursing note reviewed.  Constitutional:      Appearance: She is obese.  HENT:     Head: Atraumatic.  Eyes:     Pupils: Pupils are equal, round, and reactive to light.  Cardiovascular:     Rate and Rhythm: Regular rhythm. Tachycardia present.  Pulmonary:     Effort: Pulmonary effort is normal.  Abdominal:     Palpations: Abdomen is soft.  Musculoskeletal:     Cervical back: Neck supple.  Skin:    General: Skin is cool.  Neurological:     GCS: GCS eye subscore is 4. GCS verbal subscore is 4. GCS motor subscore is 6.      Comments: Left hemiparesis     ED Results / Procedures / Treatments   Labs (all labs ordered are listed, but only abnormal results are displayed) Labs Reviewed  COMPREHENSIVE METABOLIC PANEL - Abnormal; Notable for the following components:      Result Value   Sodium 131 (*)    CO2 16 (*)    Glucose, Bld 366 (*)    BUN 27 (*)    Creatinine, Ser 1.96 (*)    Calcium 8.8 (*)    GFR, Estimated 31 (*)    Anion gap 17 (*)    All other components within normal limits  CBC WITH DIFFERENTIAL/PLATELET -  Abnormal; Notable for the following components:   RDW 16.4 (*)    All other components within normal limits  APTT - Abnormal; Notable for the following components:   aPTT 21 (*)    All other components within normal limits  URINALYSIS, W/ REFLEX TO CULTURE (INFECTION SUSPECTED) - Abnormal; Notable for the following components:   APPearance CLOUDY (*)    Glucose, UA >=500 (*)    Bacteria, UA RARE (*)    All other components within normal limits  OCCULT BLOOD GASTRIC / DUODENUM (SPECIMEN CUP) - Abnormal; Notable for the following components:   Occult Blood, Gastric POSITIVE (*)    All other components within normal limits  I-STAT CG4 LACTIC ACID, ED - Abnormal; Notable for the following components:   Lactic Acid, Venous 2.5 (*)    All other components within normal limits  CBG MONITORING, ED - Abnormal; Notable for the following components:   Glucose-Capillary 345 (*)    All other components within normal limits  I-STAT ARTERIAL BLOOD GAS, ED - Abnormal; Notable for the following components:   Acid-base deficit 5.0 (*)    Sodium 133 (*)    All other components within normal limits  I-STAT CG4 LACTIC ACID, ED - Abnormal; Notable for the following components:   Lactic Acid, Venous 3.5 (*)    All other components within normal limits  RESP PANEL BY RT-PCR (RSV, FLU A&B, COVID)  [id number]  CULTURE, BLOOD (ROUTINE X 2)  CULTURE, BLOOD (ROUTINE X 2)  PROTIME-INR  LIPASE, BLOOD  RAPID  URINE DRUG SCREEN, HOSP PERFORMED  MAGNESIUM  TROPONIN I (HIGH SENSITIVITY)  TROPONIN I (HIGH SENSITIVITY)    EKG EKG Interpretation Date/Time:  Thursday November 21 2022 23:11:14 EDT Ventricular Rate:  94 PR Interval:  177 QRS Duration:  93 QT Interval:  352 QTC Calculation: 441 R Axis:   101  Text Interpretation: Sinus rhythm Right axis deviation Borderline T abnormalities, diffuse leads Confirmed by [name] [phone]) on 11/22/2022 1:28:16 AM  Radiology CT CHEST ABDOMEN PELVIS WO CONTRAST  Result Date: 11/22/2022 CLINICAL DATA:  Sepsis and mental status change EXAM: CT CHEST, ABDOMEN AND PELVIS WITHOUT CONTRAST TECHNIQUE: Multidetector CT imaging of the chest, abdomen and pelvis was performed following the standard protocol without IV contrast. RADIATION DOSE REDUCTION: This exam was performed according to the departmental dose-optimization program which includes automated exposure control, adjustment of the mA and/or kV according to patient size and/or use of iterative reconstruction technique. COMPARISON:  Abdominal CT 10/31/2022 FINDINGS: CT CHEST FINDINGS Cardiovascular: No acute vascular findings. Normal heart size. No pericardial effusion. Extensive atheromatous calcification of the aorta and coronaries. Prior CABG. Aberrant right subclavian artery. Mediastinum/Nodes: No mass or adenopathy. Enteric tube which traverses the esophagus. Small sliding hiatal hernia. Lungs/Pleura: Band of opacity in the right lower lobe is stable from prior and attributed to scar. Low volume lungs, mild hazy densities likely atelectasis with motion. There is no edema, consolidation, effusion, or pneumothorax. Musculoskeletal: No acute finding CT ABDOMEN PELVIS FINDINGS Lower chest:  No contributory findings. Hepatobiliary: No focal liver abnormality.No evidence of biliary obstruction or stone. Pancreas: Calcifications are likely vascular.  No acute finding. Spleen: Unremarkable. Adrenals/Urinary  Tract: Negative adrenals. No hydronephrosis or stone. Unremarkable bladder. Stomach/Bowel: No obstruction. No appendicitis. Duodenal diverticulum, uncomplicated. Vascular/Lymphatic: No acute vascular abnormality. Extensive atheromatous calcification of the aorta and iliacs. No mass or adenopathy. Reproductive:No pathologic findings. Other: No ascites or pneumoperitoneum. Spiculated density in the lower midline abdominal wall remain stable consistent with  scarring. Musculoskeletal: No acute abnormalities. IMPRESSION: 1. No acute finding or specific cause for history of sepsis. 2. Advanced atherosclerosis. Electronically Signed   By: Tiburcio Pea M.D.   On: 11/22/2022 04:03   CT HEAD WO CONTRAST ( )  Result Date: 11/22/2022 CLINICAL DATA:  Mental status change, sepsis EXAM: CT HEAD WITHOUT CONTRAST TECHNIQUE: Contiguous axial images were obtained from the base of the skull through the vertex without intravenous contrast. RADIATION DOSE REDUCTION: This exam was performed according to the departmental dose-optimization program which includes automated exposure control, adjustment of the mA and/or kV according to patient size and/or use of iterative reconstruction technique. COMPARISON:  11/03/2022 FINDINGS: Brain: No evidence of acute infarction, hemorrhage, mass, mass effect, or midline shift. No hydrocephalus or extra-axial fluid collection. 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 Additional remote lacunar infarcts in the bilateral basal ganglia and thalami and in the right cerebellum. Periventricular white matter changes, likely the sequela of chronic small vessel ischemic disease. Vascular: No hyperdense vessel. Atherosclerotic calcifications in the intracranial carotid and vertebral arteries. Skull: Negative for fracture or focal lesion. Sinuses/Orbits: No acute finding. Other: The mastoid air cells are well aerated.  Nasogastric tube. IMPRESSION: No acute intracranial process.  Electronically Signed   By: Wiliam Ke M.D.   On: 11/22/2022 03:53   DG Abdomen 1 View  Result Date: 11/22/2022 CLINICAL DATA:  NG tube placement EXAM: ABDOMEN - 1 VIEW COMPARISON:  10/31/2022 FINDINGS: Limited field of view for tube placement verification. An enteric tube has been placed with tip projecting over the upper abdomen at the midline consistent with location in the body of the stomach. Visualized bowel gas pattern is normal without gaseous distention of bowel demonstrated. Lung bases are clear. Postoperative changes in the mediastinum. IMPRESSION: Enteric tube tip projects over the upper abdomen consistent with location in the body of the stomach. Electronically Signed   By: Burman Nieves M.D.   On: 11/22/2022 03:31   DG Chest Port 1 View  Result Date: 11/21/2022 CLINICAL DATA:  Question of sepsis to evaluate for abnormality. Low blood sugar. EXAM: PORTABLE CHEST 1 VIEW COMPARISON:  11/04/2022 FINDINGS: Postoperative changes in the mediastinum. Shallow inspiration. Heart size and pulmonary vascularity are normal for technique. Developing linear atelectasis or infiltration in the mid lungs. No pleural effusions. No pneumothorax. 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 Calcification of the aorta. IMPRESSION: Shallow inspiration with developing linear atelectasis or infiltration in the mid lungs. Electronically Signed   By: Burman Nieves M.D.   On: 11/21/2022 23:25    Procedures Procedures    Medications Ordered in ED Medications  pantoprazole (PROTONIX) injection 40 mg (has no administration in time range)    Followed by  pantoprazole (PROTONIX) injection 40 mg (has no administration in time range)  acetaminophen (TYLENOL) tablet 650 mg (has no administration in time range)    Or  acetaminophen (TYLENOL) suppository 650 mg (has no administration in time range)  ondansetron (ZOFRAN) injection 4 mg (has no administration in time range)  LORazepam (ATIVAN) injection 0.5 mg (has no  administration in time range)  lactated ringers infusion ( Intravenous New Bag/Given 11/22/22 0628)  cefTRIAXone (ROCEPHIN) 1 g in sodium chloride 0.9 % 100 mL IVPB (0 g Intravenous Stopped 11/22/22 0319)  azithromycin (ZITHROMAX) 500 mg in sodium chloride 0.9 % 250 mL IVPB (0 mg Intravenous Stopped 11/22/22 0505)  metoCLOPramide (REGLAN) injection 10 mg (10 mg Intravenous Given 11/22/22 0243)  sodium chloride 0.9 % bolus 1,000 mL (1,000 mLs Intravenous  Bolus 11/22/22 0255)  pantoprazole (PROTONIX) injection 40 mg (40 mg Intravenous Given 11/22/22 1308)    ED Course/ Medical Decision Making/ A&P                                 Medical Decision Making Amount and/or Complexity of Data Reviewed Labs: ordered. Radiology: ordered. ECG/medicine tests: ordered.  Risk Prescription drug management. Decision regarding hospitalization.   Differential diagnosis considered includes, but not limited to: TIA; Stroke; ICH; Seizure; electrolyte abnormality; hypoglycemia; toxic/pharmacologic causes; CNS infection; psychiatric disorder  Patient somewhat lethargic at arrival.  She is somnolent, awakens to voice.  Baseline mental status is unclear.  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  She is somewhat difficult to understand but does express herself verbally and follow commands.  She does fall asleep when not stimulated.  She does not have a particularly focal examination here.  Patient with recent hospitalization with similar presentation.  She had a thorough evaluation including neurology at that time, MRI with no findings of acute stroke.  It was felt that she had dehydration and acute kidney injury secondary to vomiting, likely causing her to have increased sedation secondary to her medications.  This is likely the presentation once again.  Patient with nausea and vomiting prehospital.  She continued to have vomiting here in the ED.  Chest x-ray with  possible infiltrate.  Based on her tachycardia and presentation, treatment was initiated with Rocephin and Zithromax.  Vomiting refractory to Zofran and Reglan.  An NG tube was placed because of concern for aspiration with continued vomiting.  She was placed to low intermittent suction.  CT head without acute stroke.  CT chest abdomen and pelvis without acute findings.  Area seen on x-ray is consistent with chronic scarring.  Patient given IV fluids for acute kidney injury.  She is becoming more alert.  She will be monitored on medicine service.        Final Clinical Impression(s) / ED Diagnoses Final diagnoses:  AKI (acute kidney injury) (HCC)  Altered mental status, unspecified altered mental status type    Rx / DC Orders ED Discharge Orders     None         Margrete Delude, Canary Brim, MD 11/22/22 [phone]

## 2022-11-22 ENCOUNTER — Emergency Department (HOSPITAL_COMMUNITY): Payer: 59

## 2022-11-22 ENCOUNTER — Encounter (INDEPENDENT_AMBULATORY_CARE_PROVIDER_SITE_OTHER): Payer: 59 | Admitting: Family

## 2022-11-22 DIAGNOSIS — R2689 Other abnormalities of gait and mobility: Secondary | ICD-10-CM | POA: Diagnosis not present

## 2022-11-22 DIAGNOSIS — N179 Acute kidney failure, unspecified: Secondary | ICD-10-CM | POA: Diagnosis present

## 2022-11-22 DIAGNOSIS — R5381 Other malaise: Secondary | ICD-10-CM | POA: Diagnosis not present

## 2022-11-22 DIAGNOSIS — F32A Depression, unspecified: Secondary | ICD-10-CM | POA: Diagnosis present

## 2022-11-22 DIAGNOSIS — G8194 Hemiplegia, unspecified affecting left nondominant side: Secondary | ICD-10-CM | POA: Diagnosis not present

## 2022-11-22 DIAGNOSIS — Z8679 Personal history of other diseases of the circulatory system: Secondary | ICD-10-CM | POA: Diagnosis not present

## 2022-11-22 DIAGNOSIS — I959 Hypotension, unspecified: Secondary | ICD-10-CM

## 2022-11-22 DIAGNOSIS — M6281 Muscle weakness (generalized): Secondary | ICD-10-CM | POA: Diagnosis not present

## 2022-11-22 DIAGNOSIS — E860 Dehydration: Secondary | ICD-10-CM

## 2022-11-22 DIAGNOSIS — F419 Anxiety disorder, unspecified: Secondary | ICD-10-CM

## 2022-11-22 DIAGNOSIS — E871 Hypo-osmolality and hyponatremia: Secondary | ICD-10-CM | POA: Diagnosis present

## 2022-11-22 DIAGNOSIS — I110 Hypertensive heart disease with heart failure: Secondary | ICD-10-CM | POA: Diagnosis present

## 2022-11-22 DIAGNOSIS — E0590 Thyrotoxicosis, unspecified without thyrotoxic crisis or storm: Secondary | ICD-10-CM

## 2022-11-22 DIAGNOSIS — Z7401 Bed confinement status: Secondary | ICD-10-CM | POA: Diagnosis not present

## 2022-11-22 DIAGNOSIS — E11649 Type 2 diabetes mellitus with hypoglycemia without coma: Secondary | ICD-10-CM | POA: Diagnosis not present

## 2022-11-22 DIAGNOSIS — Z794 Long term (current) use of insulin: Secondary | ICD-10-CM | POA: Diagnosis not present

## 2022-11-22 DIAGNOSIS — Z4682 Encounter for fitting and adjustment of non-vascular catheter: Secondary | ICD-10-CM | POA: Diagnosis not present

## 2022-11-22 DIAGNOSIS — A419 Sepsis, unspecified organism: Secondary | ICD-10-CM | POA: Diagnosis not present

## 2022-11-22 DIAGNOSIS — E872 Acidosis, unspecified: Secondary | ICD-10-CM | POA: Diagnosis present

## 2022-11-22 DIAGNOSIS — E896 Postprocedural adrenocortical (-medullary) hypofunction: Secondary | ICD-10-CM | POA: Diagnosis present

## 2022-11-22 DIAGNOSIS — R1312 Dysphagia, oropharyngeal phase: Secondary | ICD-10-CM | POA: Diagnosis not present

## 2022-11-22 DIAGNOSIS — Z6836 Body mass index (BMI) 36.0-36.9, adult: Secondary | ICD-10-CM | POA: Diagnosis not present

## 2022-11-22 DIAGNOSIS — R112 Nausea with vomiting, unspecified: Secondary | ICD-10-CM | POA: Diagnosis not present

## 2022-11-22 DIAGNOSIS — K449 Diaphragmatic hernia without obstruction or gangrene: Secondary | ICD-10-CM | POA: Diagnosis not present

## 2022-11-22 DIAGNOSIS — E119 Type 2 diabetes mellitus without complications: Secondary | ICD-10-CM | POA: Diagnosis not present

## 2022-11-22 DIAGNOSIS — R000 Tachycardia, unspecified: Secondary | ICD-10-CM | POA: Diagnosis not present

## 2022-11-22 DIAGNOSIS — I5032 Chronic diastolic (congestive) heart failure: Secondary | ICD-10-CM

## 2022-11-22 DIAGNOSIS — R410 Disorientation, unspecified: Secondary | ICD-10-CM | POA: Insufficient documentation

## 2022-11-22 DIAGNOSIS — Z7901 Long term (current) use of anticoagulants: Secondary | ICD-10-CM

## 2022-11-22 DIAGNOSIS — E876 Hypokalemia: Secondary | ICD-10-CM | POA: Diagnosis not present

## 2022-11-22 DIAGNOSIS — E1143 Type 2 diabetes mellitus with diabetic autonomic (poly)neuropathy: Secondary | ICD-10-CM | POA: Diagnosis present

## 2022-11-22 DIAGNOSIS — R4701 Aphasia: Secondary | ICD-10-CM | POA: Diagnosis not present

## 2022-11-22 DIAGNOSIS — I10 Essential (primary) hypertension: Secondary | ICD-10-CM

## 2022-11-22 DIAGNOSIS — R1310 Dysphagia, unspecified: Secondary | ICD-10-CM | POA: Diagnosis not present

## 2022-11-22 DIAGNOSIS — K219 Gastro-esophageal reflux disease without esophagitis: Secondary | ICD-10-CM | POA: Diagnosis not present

## 2022-11-22 DIAGNOSIS — R4182 Altered mental status, unspecified: Secondary | ICD-10-CM | POA: Diagnosis not present

## 2022-11-22 DIAGNOSIS — Z1152 Encounter for screening for COVID-19: Secondary | ICD-10-CM | POA: Diagnosis not present

## 2022-11-22 DIAGNOSIS — E039 Hypothyroidism, unspecified: Secondary | ICD-10-CM | POA: Diagnosis present

## 2022-11-22 DIAGNOSIS — Z8673 Personal history of transient ischemic attack (TIA), and cerebral infarction without residual deficits: Secondary | ICD-10-CM

## 2022-11-22 DIAGNOSIS — J4520 Mild intermittent asthma, uncomplicated: Secondary | ICD-10-CM | POA: Diagnosis present

## 2022-11-22 DIAGNOSIS — I69354 Hemiplegia and hemiparesis following cerebral infarction affecting left non-dominant side: Secondary | ICD-10-CM | POA: Diagnosis not present

## 2022-11-22 DIAGNOSIS — Z743 Need for continuous supervision: Secondary | ICD-10-CM | POA: Diagnosis not present

## 2022-11-22 DIAGNOSIS — R531 Weakness: Secondary | ICD-10-CM | POA: Diagnosis not present

## 2022-11-22 DIAGNOSIS — E785 Hyperlipidemia, unspecified: Secondary | ICD-10-CM | POA: Diagnosis present

## 2022-11-22 DIAGNOSIS — I672 Cerebral atherosclerosis: Secondary | ICD-10-CM | POA: Diagnosis not present

## 2022-11-22 DIAGNOSIS — K226 Gastro-esophageal laceration-hemorrhage syndrome: Secondary | ICD-10-CM | POA: Diagnosis present

## 2022-11-22 DIAGNOSIS — R262 Difficulty in walking, not elsewhere classified: Secondary | ICD-10-CM | POA: Diagnosis not present

## 2022-11-22 DIAGNOSIS — J449 Chronic obstructive pulmonary disease, unspecified: Secondary | ICD-10-CM | POA: Diagnosis not present

## 2022-11-22 DIAGNOSIS — G9341 Metabolic encephalopathy: Secondary | ICD-10-CM

## 2022-11-22 DIAGNOSIS — E1165 Type 2 diabetes mellitus with hyperglycemia: Secondary | ICD-10-CM | POA: Diagnosis present

## 2022-11-22 LAB — CBC WITH DIFFERENTIAL/PLATELET
Abs Immature Granulocytes: 0.03 10*3/uL (ref 0.00–0.07)
Basophils Absolute: 0 10*3/uL (ref 0.0–0.1)
Basophils Relative: 1 %
Eosinophils Absolute: 0.1 10*3/uL (ref 0.0–0.5)
Eosinophils Relative: 1 %
HCT: 38.6 % (ref 36.0–46.0)
Hemoglobin: 12.6 g/dL (ref 12.0–15.0)
Immature Granulocytes: 0 %
Lymphocytes Relative: 19 %
Lymphs Abs: 1.4 10*3/uL (ref 0.7–4.0)
MCH: 26.6 pg (ref 26.0–34.0)
MCHC: 32.6 g/dL (ref 30.0–36.0)
MCV: 81.6 fL (ref 80.0–100.0)
Monocytes Absolute: 0.4 10*3/uL (ref 0.1–1.0)
Monocytes Relative: 6 %
Neutro Abs: 5.4 10*3/uL (ref 1.7–7.7)
Neutrophils Relative %: 73 %
Platelets: 386 10*3/uL (ref 150–400)
RBC: 4.73 MIL/uL (ref 3.87–5.11)
RDW: 16.4 % — ABNORMAL HIGH (ref 11.5–15.5)
WBC: 7.3 10*3/uL (ref 4.0–10.5)
nRBC: 0 % (ref 0.0–0.2)

## 2022-11-22 LAB — RENAL FUNCTION PANEL
Albumin: 3.3 g/dL — ABNORMAL LOW (ref 3.5–5.0)
Anion gap: 15 (ref 5–15)
BUN: 23 mg/dL — ABNORMAL HIGH (ref 6–20)
CO2: 20 mmol/L — ABNORMAL LOW (ref 22–32)
Calcium: 8.7 mg/dL — ABNORMAL LOW (ref 8.9–10.3)
Chloride: 102 mmol/L (ref 98–111)
Creatinine, Ser: 1.53 mg/dL — ABNORMAL HIGH (ref 0.44–1.00)
GFR, Estimated: 41 mL/min — ABNORMAL LOW (ref >60)
Glucose, Bld: 379 mg/dL — ABNORMAL HIGH (ref 70–99)
Phosphorus: 3.1 mg/dL (ref 2.5–4.6)
Potassium: 4 mmol/L (ref 3.5–5.1)
Sodium: 137 mmol/L (ref 135–145)

## 2022-11-22 LAB — URINALYSIS, W/ REFLEX TO CULTURE (INFECTION SUSPECTED)
Bilirubin Urine: NEGATIVE
Glucose, UA: >=500 mg/dL — AB
Hgb urine dipstick: NEGATIVE
Ketones, ur: NEGATIVE mg/dL
Leukocytes,Ua: NEGATIVE
Nitrite: NEGATIVE
Protein, ur: NEGATIVE mg/dL
Specific Gravity, Urine: 1.009 (ref 1.005–1.030)
pH: 5 (ref 5.0–8.0)

## 2022-11-22 LAB — RESP PANEL BY RT-PCR (RSV, FLU A&B, COVID)  RVPGX2
Influenza A by PCR: NEGATIVE
Influenza B by PCR: NEGATIVE
Resp Syncytial Virus by PCR: NEGATIVE
SARS Coronavirus 2 by RT PCR: NEGATIVE

## 2022-11-22 LAB — APTT: aPTT: 21 s — ABNORMAL LOW (ref 24–36)

## 2022-11-22 LAB — COMPREHENSIVE METABOLIC PANEL
ALT: 16 U/L (ref 0–44)
AST: 28 U/L (ref 15–41)
Albumin: 3.7 g/dL (ref 3.5–5.0)
Alkaline Phosphatase: 78 U/L (ref 38–126)
Anion gap: 17 — ABNORMAL HIGH (ref 5–15)
BUN: 27 mg/dL — ABNORMAL HIGH (ref 6–20)
CO2: 16 mmol/L — ABNORMAL LOW (ref 22–32)
Calcium: 8.8 mg/dL — ABNORMAL LOW (ref 8.9–10.3)
Chloride: 98 mmol/L (ref 98–111)
Creatinine, Ser: 1.96 mg/dL — ABNORMAL HIGH (ref 0.44–1.00)
GFR, Estimated: 31 mL/min — ABNORMAL LOW (ref >60)
Glucose, Bld: 366 mg/dL — ABNORMAL HIGH (ref 70–99)
Potassium: 5.1 mmol/L (ref 3.5–5.1)
Sodium: 131 mmol/L — ABNORMAL LOW (ref 135–145)
Total Bilirubin: 1.2 mg/dL (ref 0.3–1.2)
Total Protein: 7.3 g/dL (ref 6.5–8.1)

## 2022-11-22 LAB — RAPID URINE DRUG SCREEN, HOSP PERFORMED
Amphetamines: NOT DETECTED
Barbiturates: NOT DETECTED
Benzodiazepines: NOT DETECTED
Cocaine: NOT DETECTED
Opiates: NOT DETECTED
Tetrahydrocannabinol: NOT DETECTED

## 2022-11-22 LAB — PROTIME-INR
INR: 1.1 (ref 0.8–1.2)
Prothrombin Time: 13.9 s (ref 11.4–15.2)

## 2022-11-22 LAB — I-STAT CG4 LACTIC ACID, ED
Lactic Acid, Venous: 2.5 mmol/L (ref 0.5–1.9)
Lactic Acid, Venous: 3.5 mmol/L (ref 0.5–1.9)

## 2022-11-22 LAB — I-STAT ARTERIAL BLOOD GAS, ED
Acid-base deficit: 5 mmol/L — ABNORMAL HIGH (ref 0.0–2.0)
Bicarbonate: 20.7 mmol/L (ref 20.0–28.0)
Calcium, Ion: 1.17 mmol/L (ref 1.15–1.40)
HCT: 37 % (ref 36.0–46.0)
Hemoglobin: 12.6 g/dL (ref 12.0–15.0)
O2 Saturation: 96 %
Patient temperature: 96.8
Potassium: 4 mmol/L (ref 3.5–5.1)
Sodium: 133 mmol/L — ABNORMAL LOW (ref 135–145)
TCO2: 22 mmol/L (ref 22–32)
pCO2 arterial: 36.2 mm[Hg] (ref 32–48)
pH, Arterial: 7.359 (ref 7.35–7.45)
pO2, Arterial: 84 mm[Hg] (ref 83–108)

## 2022-11-22 LAB — CBC
HCT: 32.7 % — ABNORMAL LOW (ref 36.0–46.0)
Hemoglobin: 11.1 g/dL — ABNORMAL LOW (ref 12.0–15.0)
MCH: 26.6 pg (ref 26.0–34.0)
MCHC: 33.9 g/dL (ref 30.0–36.0)
MCV: 78.4 fL — ABNORMAL LOW (ref 80.0–100.0)
Platelets: 425 10*3/uL — ABNORMAL HIGH (ref 150–400)
RBC: 4.17 MIL/uL (ref 3.87–5.11)
RDW: 16.2 % — ABNORMAL HIGH (ref 11.5–15.5)
WBC: 12.6 10*3/uL — ABNORMAL HIGH (ref 4.0–10.5)
nRBC: 0 % (ref 0.0–0.2)

## 2022-11-22 LAB — CBG MONITORING, ED
Glucose-Capillary: 357 mg/dL — ABNORMAL HIGH (ref 70–99)
Glucose-Capillary: 276 mg/dL — ABNORMAL HIGH (ref 70–99)
Glucose-Capillary: 198 mg/dL — ABNORMAL HIGH (ref 70–99)
Glucose-Capillary: 143 mg/dL — ABNORMAL HIGH (ref 70–99)

## 2022-11-22 LAB — HEMOGLOBIN AND HEMATOCRIT, BLOOD
HCT: 32.6 % — ABNORMAL LOW (ref 36.0–46.0)
Hemoglobin: 9.8 g/dL — ABNORMAL LOW (ref 12.0–15.0)

## 2022-11-22 LAB — TROPONIN I (HIGH SENSITIVITY)
Troponin I (High Sensitivity): 7 ng/L (ref <18)
Troponin I (High Sensitivity): 4 ng/L (ref <18)

## 2022-11-22 LAB — LACTIC ACID, PLASMA: Lactic Acid, Venous: 2.4 mmol/L (ref 0.5–1.9)

## 2022-11-22 LAB — OCCULT BLOOD GASTRIC / DUODENUM (SPECIMEN CUP): Occult Blood, Gastric: POSITIVE — AB

## 2022-11-22 LAB — LIPASE, BLOOD: Lipase: 25 U/L (ref 11–51)

## 2022-11-22 LAB — AMMONIA: Ammonia: 45 umol/L — ABNORMAL HIGH (ref 9–35)

## 2022-11-22 LAB — MAGNESIUM: Magnesium: 1.7 mg/dL (ref 1.7–2.4)

## 2022-11-22 MED ORDER — LINEZOLID 600 MG/300ML IV SOLN
600.0000 mg | Freq: Two times a day (BID) | INTRAVENOUS | Status: DC
  Administered 2022-11-22 – 2022-11-23 (×2): 600 mg via INTRAVENOUS
  Filled 2022-11-22 (×3): qty 300

## 2022-11-22 MED ORDER — ACETAMINOPHEN 325 MG PO TABS
650.0000 mg | ORAL_TABLET | Freq: Four times a day (QID) | ORAL | Status: DC | PRN
  Administered 2022-11-29 – 2022-12-04 (×5): 650 mg via ORAL
  Filled 2022-11-22 (×5): qty 2

## 2022-11-22 MED ORDER — INSULIN ASPART 100 UNIT/ML IJ SOLN
0.0000 [IU] | INTRAMUSCULAR | Status: DC
  Administered 2022-11-22: 1 [IU] via SUBCUTANEOUS
  Administered 2022-11-22: 5 [IU] via SUBCUTANEOUS
  Administered 2022-11-22: 2 [IU] via SUBCUTANEOUS
  Administered 2022-11-22: 9 [IU] via SUBCUTANEOUS
  Administered 2022-11-23: 3 [IU] via SUBCUTANEOUS
  Administered 2022-11-23 (×2): 2 [IU] via SUBCUTANEOUS
  Administered 2022-11-23: 5 [IU] via SUBCUTANEOUS
  Administered 2022-11-23 (×2): 2 [IU] via SUBCUTANEOUS
  Administered 2022-11-24 (×2): 5 [IU] via SUBCUTANEOUS

## 2022-11-22 MED ORDER — SODIUM CHLORIDE 0.9 % IV BOLUS
1000.0000 mL | Freq: Once | INTRAVENOUS | Status: AC
  Administered 2022-11-22: 1000 mL via INTRAVENOUS

## 2022-11-22 MED ORDER — SODIUM CHLORIDE 0.9 % IV SOLN
1.0000 g | Freq: Once | INTRAVENOUS | Status: AC
  Administered 2022-11-22: 1 g via INTRAVENOUS
  Filled 2022-11-22: qty 10

## 2022-11-22 MED ORDER — SODIUM CHLORIDE 0.9 % IV SOLN: 2.0000 g | Freq: Once | INTRAVENOUS | Status: DC

## 2022-11-22 MED ORDER — LINEZOLID 600 MG/300ML IV SOLN
600.0000 mg | Freq: Two times a day (BID) | INTRAVENOUS | Status: DC
  Filled 2022-11-22 (×2): qty 300

## 2022-11-22 MED ORDER — PANTOPRAZOLE 80MG IVPB - SIMPLE MED: 80.0000 mg | Freq: Once | INTRAVENOUS | Status: DC

## 2022-11-22 MED ORDER — LACTATED RINGERS IV SOLN: INTRAVENOUS | Status: DC

## 2022-11-22 MED ORDER — SODIUM CHLORIDE 0.9 % IV SOLN: INTRAVENOUS | Status: DC

## 2022-11-22 MED ORDER — SODIUM CHLORIDE 0.9 % IV SOLN
2.0000 g | Freq: Two times a day (BID) | INTRAVENOUS | Status: DC
  Administered 2022-11-22 – 2022-11-23 (×2): 2 g via INTRAVENOUS
  Filled 2022-11-22 (×2): qty 12.5

## 2022-11-22 MED ORDER — ONDANSETRON HCL 4 MG/2ML IJ SOLN
4.0000 mg | Freq: Four times a day (QID) | INTRAMUSCULAR | Status: DC | PRN
  Administered 2022-11-22 – 2022-12-03 (×2): 4 mg via INTRAVENOUS
  Filled 2022-11-22 (×2): qty 2

## 2022-11-22 MED ORDER — METOCLOPRAMIDE HCL 5 MG/ML IJ SOLN
10.0000 mg | Freq: Once | INTRAMUSCULAR | Status: AC
  Administered 2022-11-22: 10 mg via INTRAVENOUS
  Filled 2022-11-22: qty 2

## 2022-11-22 MED ORDER — LORAZEPAM 2 MG/ML IJ SOLN
0.5000 mg | Freq: Four times a day (QID) | INTRAMUSCULAR | Status: DC | PRN
  Administered 2022-11-22: 0.5 mg via INTRAVENOUS
  Filled 2022-11-22: qty 1

## 2022-11-22 MED ORDER — PANTOPRAZOLE SODIUM 40 MG IV SOLR
40.0000 mg | Freq: Two times a day (BID) | INTRAVENOUS | Status: DC
  Filled 2022-11-22: qty 10

## 2022-11-22 MED ORDER — SODIUM CHLORIDE 0.9 % IV SOLN
500.0000 mg | Freq: Once | INTRAVENOUS | Status: AC
  Administered 2022-11-22: 500 mg via INTRAVENOUS
  Filled 2022-11-22: qty 5

## 2022-11-22 MED ORDER — ACETAMINOPHEN 650 MG RE SUPP: 650.0000 mg | Freq: Four times a day (QID) | RECTAL | Status: DC | PRN

## 2022-11-22 MED ORDER — PANTOPRAZOLE SODIUM 40 MG IV SOLR
40.0000 mg | INTRAVENOUS | Status: AC
  Administered 2022-11-22 (×2): 40 mg via INTRAVENOUS
  Filled 2022-11-22 (×2): qty 10

## 2022-11-22 MED ORDER — PANTOPRAZOLE SODIUM 40 MG IV SOLR: 40.0000 mg | Freq: Two times a day (BID) | INTRAVENOUS | Status: DC

## 2022-11-22 MED ORDER — PANTOPRAZOLE SODIUM 40 MG IV SOLR
40.0000 mg | Freq: Three times a day (TID) | INTRAVENOUS | Status: DC
  Administered 2022-11-22 – 2022-11-25 (×7): 40 mg via INTRAVENOUS
  Filled 2022-11-22 (×7): qty 10

## 2022-11-22 MED ORDER — PANTOPRAZOLE INFUSION (NEW) - SIMPLE MED: 8.0000 mg/h | INTRAVENOUS | Status: DC

## 2022-11-22 NOTE — ED Notes (Signed)
Provider at bedside no new orders for agitation. Family updated from Hospitalist.

## 2022-11-22 NOTE — Progress Notes (Addendum)
Patient is complaining of chest pain. EKG was obtained and no acute ischemic features noted. Plan to check HS troponin q2h x2.

## 2022-11-22 NOTE — H&P (Signed)
History and Physical    Patient: Lori [name] ION:[id number] DOB: 12-24-1970 DOA: 11/21/2022 DOS: the patient was seen and examined on 11/22/2022 PCP: Ngetich, Donalee Citrin, NP  Patient coming from: Home.  Lives with husband.  Uses wheelchair at baseline.  Chief Complaint:  Chief Complaint  Patient presents with   Altered Mental Status   HPI: Lori [name] is a 52 y.o. female with PMH of CVA with left hemiparesis/spasticity/aphasia, IDDM-2, CAD, diastolic CHF, HTN, hyperthyroidism, anxiety and depression presenting with behavioral change, increased somnolence and nausea and vomiting.  Initially, patient was not able to provide history other than saying "my name is Lori [name]" repeatedly.  She was apprehensive and crying.  She was restless.  Heart rate was to 140s and 150s when she is restless.  However, she falls back asleep and heart rate improved to 110s.  Later, patient's husband arrived.  She was calmer although she gets agitated at times.  She feels thirsty and asking for water.  She also likes to go home.  Per husband, patient was crying, laughing and fussing the whole day yesterday.   In the evening when she returned home, she was so somnolent and difficult to arouse.  She also had 3 episodes of emesis.  They deny fever, chills, runny nose, sore throat, chest pain, cough, shortness of breath, abdominal pain or UTI symptoms.  Denies new medications.  She is currently awake but somewhat agitated.  She is oriented x 4.  Her speech is difficult to understand due to underlying aphasia/dysarthria.   Denies smoking cigarette, drinking alcohol recreational drug use.  Likes to have cardiopulmonary cessation in an event of sudden cardiopulmonary arrest.  In ED, HR ranges from 100s to 150s.  Soft blood pressure.  100% on RA.  ABG basically normal. Na 131.  BMP glucose 366.  Bicarb 16.  Anion gap 17. Cr 1.96 (0.98 on 10/2) at.  BUN 27.  LFT normal.  CBC and UA without significant finding.  UDS  negative.  Gastric Hemoccult positive.  Troponin and coag labs negative.  CXR with shallow inspiration with possible developing linear atelectasis or infiltrate in mid lungs.  CT chest, abdomen and pelvis without significant finding.  EKG sinus rhythm with right axis deviation and borderline T wave changes.  Blood cultures obtained.  Received Reglan, ceftriaxone, Zithromax, IV Protonix and NS bolus 1 L.  Admission accepted by overnight admitter for acute metabolic encephalopathy, AKI and dehydration.    Review of Systems: As mentioned in the history of present illness. All other systems reviewed and are negative. Past Medical History:  Diagnosis Date   CAD (coronary artery disease)    Depression    Diabetes mellitus without complication (HCC)    History of CT scan    History of mammogram    History of MRI    Hypertension    Pheochromocytoma    Stroke [facility])    Thyroid disease    Past Surgical History:  Procedure Laterality Date   ABDOMINAL HYSTERECTOMY     CESAREAN SECTION     3   CORONARY ARTERY BYPASS GRAFT     Right adrenal gland removal for pheochromocytoma Right    Social History:  reports that she has never smoked. She has never used smokeless tobacco. She reports that she does not currently use alcohol. She reports that she does not use drugs.  Allergies  Allergen Reactions   Asa [Aspirin] Anaphylaxis and Swelling    Throat closes   Contrast Media [Iodinated  Contrast Media] Anaphylaxis and Swelling   Imitrex [Sumatriptan] Anaphylaxis   Penicillins Swelling    Mouth and eye swelling   Firvanq [Vancomycin] Other (See Comments)    Red Man's Syndrome   Cipro [Ciprofloxacin Hcl] Nausea And Vomiting   Ms Contin [Morphine] Swelling   Wound Dressing Adhesive Itching and Rash    Family History  Problem Relation Age of Onset   Hypertension Mother    Diabetes Mother    Atrial fibrillation Mother    Hypertension Father    Heart attack Father    Dementia Father    Diabetes  Brother    Brain cancer Brother    Asthma Daughter    Sickle cell anemia Son    Atrial fibrillation Maternal Uncle     Prior to Admission medications   Medication Sig Start Date End Date Taking? Authorizing Provider  albuterol (PROVENTIL) (2.5 MG/3ML) 0.083% nebulizer solution Take 3 mLs (2.5 mg total) by nebulization every 6 (six) hours as needed for wheezing or shortness of breath. 05/21/22   Ngetich, Dinah C, NP  albuterol (VENTOLIN HFA) 108 (90 Base) MCG/ACT inhaler INHALE 2 PUFFS BY MOUTH EVERY 6 HOURS AS NEEDED FOR WHEEZING AND FOR SHORTNESS OF BREATH 10/17/22   Ngetich, Dinah C, NP  atorvastatin (LIPITOR) 80 MG tablet Take 1 tablet (80 mg total) by mouth daily. Patient taking differently: Take 80 mg by mouth at bedtime. 09/02/22   Ngetich, Dinah C, NP  baclofen (LIORESAL) 10 MG tablet TAKE 1 TABLET BY MOUTH 3 TIMES DAILY AT 9AM, 1PM AND 5PM FOR SPASTICITY 11/18/22   Lovorn, Megan, MD  blood glucose meter kit and supplies 1 each by Other route as directed. Dispense based on patient and insurance preference. Use up to four times daily as directed. (FOR ICD-10 E10.9, E11.9). 06/24/22   Ngetich, Dinah C, NP  budesonide (PULMICORT) 0.25 MG/2ML nebulizer solution Take 2 mLs (0.25 mg total) by nebulization 2 (two) times daily. 05/21/22   Ngetich, Dinah C, NP  clopidogrel (PLAVIX) 75 MG tablet Take 1 tablet (75 mg total) by mouth daily. 09/02/22   Ngetich, Donalee Citrin, NP  Continuous Glucose Receiver (FREESTYLE LIBRE 2 READER) DEVI 1 Device by Does not apply route daily. E11.69 11/01/22   Ngetich, Dinah C, NP  Continuous Glucose Sensor (FREESTYLE LIBRE 14 DAY SENSOR) MISC 1 each by Does not apply route as needed. DX: E11.69 08/06/22   Sharon Seller, NP  cyclobenzaprine (FLEXERIL) 5 MG tablet TAKE 1 TABLET BY MOUTH 3 TIMES DAILY AS NEEDED FOR MUSCLE SPASMS 11/18/22   Lovorn, Aundra Millet, MD  dicyclomine (BENTYL) 20 MG tablet Take 1 tablet (20 mg total) by mouth 2 (two) times daily. 10/31/22   Elayne Snare  K, DO  escitalopram (LEXAPRO) 20 MG tablet Take 1 tablet (20 mg total) by mouth every morning. 09/02/22   Ngetich, Dinah C, NP  ezetimibe (ZETIA) 10 MG tablet Take 1 tablet (10 mg total) by mouth daily. Patient taking differently: Take 10 mg by mouth at bedtime. 09/02/22   Ngetich, Dinah C, NP  insulin aspart (NOVOLOG) 100 UNIT/ML injection Inject 15 Units into the skin 3 (three) times daily with meals. 11/01/22   Ngetich, Dinah C, NP  Insulin Pen Needle 32G X 4 MM MISC Use to inject insulin 4 times daily as directed. 05/21/22   Ngetich, Dinah C, NP  LANTUS SOLOSTAR 100 UNIT/ML Solostar Pen INJECT 40 UNITS SUBCUTANEOUSLY AT BEDTIME Patient taking differently: Inject 40 Units into the skin at bedtime. 08/21/22  Ngetich, Dinah C, NP  LORazepam (ATIVAN) 0.5 MG tablet Take 1 tablet (0.5 mg total) by mouth at bedtime. 08/27/22   Ngetich, Dinah C, NP  methimazole (TAPAZOLE) 5 MG tablet Take 1 tablet (5 mg total) by mouth every morning. 09/16/22   Ngetich, Dinah C, NP  metoprolol tartrate (LOPRESSOR) 100 MG tablet Take 100 mg by mouth 2 (two) times daily.    [provider]  nystatin (MYCOSTATIN/NYSTOP) powder Apply 1 Application topically 2 (two) times daily as needed (irritation under breast/skin folds).    [provider]  olmesartan-hydrochlorothiazide (BENICAR HCT) 40-25 MG tablet Take 1 tablet by mouth daily.    [provider]  omeprazole (PRILOSEC) 20 MG capsule Take 1 capsule (20 mg total) by mouth daily. 09/02/22   Ngetich, Dinah C, NP  polyethylene glycol powder (GLYCOLAX/MIRALAX) 17 GM/SCOOP powder Take 17 g by mouth daily. Hold for loose stool Patient taking differently: Take 17 g by mouth daily as needed (constipation). 06/27/22   Ngetich, Dinah C, NP  ranolazine (RANEXA) 500 MG 12 hr tablet Take 1 tablet (500 mg total) by mouth 2 (two) times daily. 10/09/22 01/07/23  [place], Alvira Philips, DO  rivaroxaban (XARELTO) 10 MG TABS tablet Take 1 tablet (10 mg total) by mouth daily.  09/02/22   Ngetich, Dinah C, NP  Semaglutide,0.25 or 0.5MG /DOS, (OZEMPIC, 0.25 OR 0.5 MG/DOSE,) 2 MG/1.5ML SOPN Inject 0.5 mg into the skin once a week. Patient taking differently: Inject 0.5 mg into the skin every Sunday. 11/01/22   Ngetich, Dinah C, NP  senna-docusate (SENOKOT-S) 8.6-50 MG tablet Take 1 tablet by mouth daily. Patient taking differently: Take 1 tablet by mouth daily as needed (constipation). 10/31/22   Elayne Snare K, DO  topiramate (TOPAMAX) 25 MG tablet Take 1 tablet (25 mg total) by mouth 2 (two) times daily. 07/10/22   Maurilio Lovely D, DO    Physical Exam: Vitals:   11/22/22 0905 11/22/22 0930 11/22/22 0945 11/22/22 1030  BP: 134/77 104/77  107/74  Pulse: (!) 122 (!) 130 (!) 143 (!) 131  Resp: 10 14 18  (!) 29  Temp:      TempSrc:      SpO2: 100% 100% 99% 100%   GENERAL: No apparent distress.  Nontoxic. HEENT: MMM.  Vision and hearing grossly intact.  NECK: Supple.  No apparent JVD.  RESP:  No IWOB.  Fair aeration bilaterally. CVS: HR ranges from 110s to 140s.  Heart sounds normal.  ABD/GI/GU: BS+. Abd soft, NTND.  MSK/EXT:   No apparent deformity. Moves extremities. No edema.  SKIN: no apparent skin lesion or wound NEURO: Awake and alert.  Oriented x 4.  Dysarthria.  Moves RUE.  Spasticity in LUE and LLE.  Minimal movement in RLE. PSYCH: Restless.  Somewhat agitated at times but quickly falls back to sleep Data Reviewed: See HPI  Assessment and Plan: Acute metabolic encephalopathy/agitation: Likely due to dehydration.  She is also at risk for polypharmacy.  It seems like she is on Flexeril, baclofen, Bentyl and Ativan but medication need to be verified.  She has lactic acidosis likely from dehydration.  No clear source of infection.  Seems like she has fluctuating mental status.  Initially, she was unable to provide any history.  Later on, she was awake and oriented x 4 though she tends to fall asleep.  However, she was agitated and restless and wanted to go  home.  CT head and ABG without significant finding. -Minimize sedating medications -Check ammonia -Reorientation and delirium precaution -  Treat treatable causes -N.p.o. pending SLP eval. Per RN, she can pass swallow eval.   Dehydration due to nausea and vomiting: Nausea and vomiting seems to have subsided.  Abdominal exam benign.  CT chest, abdomen and pelvis without significant finding.  Has hyperglycemia with anion gap metabolic acidosis suggesting DKA but normal pH on ABG.  Acidosis could be due to lactic acid.  Remains NPO pending SLP eval. -Change LR to NS  AKI: Likely due to the above.  Baseline Cr ~0.9-1.0.  Likely due to the above. Recent Labs    10/15/22 0330 10/31/22 1315 11/01/22 1609 11/04/22 0054 11/04/22 0101 11/04/22 0456 11/05/22 0639 11/06/22 0131 11/22/22 0019 11/22/22 0920  BUN 13 39* 28* 30* 29* 28* 15 13 27* 23*  CREATININE 0.84 1.73* 1.01 1.83* 2.00* 1.68* 1.13* 0.98 1.96* 1.53*  -Continue IV fluid -Avoid nephrotoxic meds -Continue monitoring   Positive gastric Hemoccult: Likely from Mallory-Weiss tear but cannot rule out gastritis or PUD.  H&H relatively stable. -Continue IV PPI -Consult GI if H&H drops -Continue holding home Xarelto  History of CVA with residual left hemiplegia/spasticity and dysarthria: CT head without acute finding. -Will resume home meds once verified and she is able to take p.o. safely  History of CAD: Serial troponin negative.  EKG without acute ischemic finding.  Hypotension/history of essential hypertension: Soft blood pressures -Continue IV fluid as above -Hold antihypertensive meds  Sinus tachycardia: Due to dehydration and agitation. -IV fluid for hydration -May try p.o. Seroquel once she is able to take p.o.  Uncontrolled IDDM-2 with hyperglycemia: She has hyperglycemia with anion gap metabolic acidosis but normal pH on ABG.  Lactic acid is elevated which might contribute. Recent Labs  Lab 11/21/22 2358  11/22/22 0901 11/22/22 1207  GLUCAP 345* 357* 276*  -SSI every 4 hours while NPO -IV fluid as above -Check BHA  History of diastolic CHF: Appears euvolemic overall.  No cardiopulmonary symptoms. -Continue monitoring  Anxiety and depression: Somewhat anxious, restless and agitated. -Will resume home meds as appropriate  Lactic acidosis: Likely from dehydration.  Low suspicion for infection.  UA, CT chest abdomen and pelvis unrevealing.  Lactic acidosis improved after changing LR to NS. -Continue cycling -Follow blood culture   Advance Care Planning:   Code Status: Full Code   Consults: None  Family Communication: Updated patient's husband at bedside  Severity of Illness: The appropriate patient status for this patient is INPATIENT. Inpatient status is judged to be reasonable and necessary in order to provide the required intensity of service to ensure the patient's safety. The patient's presenting symptoms, physical exam findings, and initial radiographic and laboratory data in the context of their chronic comorbidities is felt to place them at high risk for further clinical deterioration. Furthermore, it is not anticipated that the patient will be medically stable for discharge from the hospital within 2 midnights of admission.   * I certify that at the point of admission it is my clinical judgment that the patient will require inpatient hospital care spanning beyond 2 midnights from the point of admission due to high intensity of service, high risk for further deterioration and high frequency of surveillance required.*  Author: Almon Hercules, MD 11/22/2022 10:34 AM  For on call review [email or web address].

## 2022-11-22 NOTE — ED Notes (Signed)
Pt had an episode of emesis, linen and gown changed.

## 2022-11-22 NOTE — ED Notes (Signed)
Patient diaphoretic and pale at this time. Pt responding in broken sentences. MD notified.

## 2022-11-22 NOTE — Plan of Care (Signed)
CHL Tonsillectomy/Adenoidectomy, Postoperative PEDS care plan entered in error.

## 2022-11-22 NOTE — Progress Notes (Signed)
Hypotensive with systolic in 70s.  Ordered IV bolus but patient's IV line infiltrated.  Patient also having mild temp to 99.5 now.  PCCM consulted and suggested ED provider to place ultrasound-guided access until they evaluate patient.  Blood culture pending.  Lactic acidosis improved.  Will start broad-spectrum antibiotics with vancomycin and cefepime.  Pharmacy to dose.

## 2022-11-22 NOTE — ED Notes (Signed)
Pt. Had coffee brown emesis while RN and MD Pollina at bedside. New orders placed will continue to monitor

## 2022-11-22 NOTE — Evaluation (Addendum)
Clinical/Bedside Swallow Evaluation Patient Details  Name: Lori [name] MRN: [id number] Date of Birth: 02-17-1970  Today's Date: 11/22/2022 Time: SLP Start Time (ACUTE ONLY): 1552 SLP Stop Time (ACUTE ONLY): 1605 SLP Time Calculation (min) (ACUTE ONLY): 13 min  Past Medical History:  Past Medical History:  Diagnosis Date   CAD (coronary artery disease)    Depression    Diabetes mellitus without complication (HCC)    History of CT scan    History of mammogram    History of MRI    Hypertension    Pheochromocytoma    Stroke (HCC)    Thyroid disease    Past Surgical History:  Past Surgical History:  Procedure Laterality Date   ABDOMINAL HYSTERECTOMY     CESAREAN SECTION     3   CORONARY ARTERY BYPASS GRAFT     Right adrenal gland removal for pheochromocytoma Right    HPI:  Lori [name] is a 52 yo female presenting to ED 10/18 with increased somnolence and n/v. MBS 01/23/21 with moderate oropharyngeal dysphagia and silent aspiration. She was recommended a diet of Dys 1 textures with nectar thick liquids. Seen extensively throughout 2023 with significant oral dysphagia, although was ultimately recommended a Dys 3 diet with thin liquids. PMH includes prior CVA with residual L hemiparesis/spasticity and aphasia, IDDM2, CAD, diastolic CHF, HTN, hyperthyroidism, anxiety, depression    Assessment / Plan / Recommendation  Clinical Impression  Pt reports history of dysphagia that has remained stable following extensive treatment throughout 2023. She states that she now eats a diet of regular texture solids and thin liquids without difficulty. Pt had emesis this date and gagged prior to PO trials, stating that she felt nauseous. Oral motor exam WFL. Trials of thin liquids and solids with multiple swallows, although no s/s of aspiration. Due to emesis, recommend initiating full liquid diet. Pt in agreement, stating she only wants to eat grits. Will f/u to assess ability to upgrade  diet as clinically indicated. SLP Visit Diagnosis: Dysphagia, unspecified (R13.10)    Aspiration Risk  Mild aspiration risk    Diet Recommendation Thin liquid    Liquid Administration via: Cup;Straw;Spoon Medication Administration: Whole meds with liquid Supervision: Patient able to self feed;Full supervision/cueing for compensatory strategies Compensations: Minimize environmental distractions;Slow rate;Small sips/bites Postural Changes: Seated upright at 90 degrees    Other  Recommendations Oral Care Recommendations: Oral care BID    Recommendations for follow up therapy are one component of a multi-disciplinary discharge planning process, led by the attending physician.  Recommendations may be updated based on patient status, additional functional criteria and insurance authorization.  Follow up Recommendations No SLP follow up      Assistance Recommended at Discharge    Functional Status Assessment Patient has had a recent decline in their functional status and demonstrates the ability to make significant improvements in function in a reasonable and predictable amount of time.  Frequency and Duration min 2x/week  2 weeks       Prognosis Prognosis for improved oropharyngeal function: Good Barriers to Reach Goals: Cognitive deficits;Language deficits;Time post onset      Swallow Study   General HPI: Lori [name] is a 52 yo female presenting to ED 10/18 with increased somnolence and n/v. MBS 01/23/21 with moderate oropharyngeal dysphagia and silent aspiration. She was recommended a diet of Dys 1 textures with nectar thick liquids. Seen extensively throughout 2023 with significant oral dysphagia, although was ultimately recommended a Dys 3 diet with thin liquids. PMH  includes prior CVA with residual L hemiparesis/spasticity and aphasia, IDDM2, CAD, diastolic CHF, HTN, hyperthyroidism, anxiety, depression Type of Study: Bedside Swallow Evaluation Previous Swallow Assessment:  see HPI Diet Prior to this Study: NPO Temperature Spikes Noted: No Respiratory Status: Room air History of Recent Intubation: No Behavior/Cognition: Alert;Cooperative Oral Cavity Assessment: Within Functional Limits Oral Care Completed by SLP: No Oral Cavity - Dentition: Edentulous Vision: Functional for self-feeding Self-Feeding Abilities: Needs assist Patient Positioning: Upright in bed Baseline Vocal Quality: Normal Volitional Cough: Strong Volitional Swallow: Able to elicit    Oral/Motor/Sensory Function Overall Oral Motor/Sensory Function: Within functional limits   Ice Chips Ice chips: Not tested   Thin Liquid Thin Liquid: Within functional limits Presentation: Straw;Self Fed    Nectar Thick Nectar Thick Liquid: Not tested   Honey Thick Honey Thick Liquid: Not tested   Puree Puree: Within functional limits Presentation: Spoon   Solid     Solid: Not tested      Gwynneth Aliment, M.A., CF-SLP Speech Language Pathology, Acute Rehabilitation Services  Secure Chat preferred [phone]  11/22/2022,5:01 PM

## 2022-11-22 NOTE — ED Notes (Signed)
Pt lying in bed turned to the left side with emesis all over the floor. Pt cleaned and repositioned.

## 2022-11-22 NOTE — ED Notes (Signed)
IV access lost. MD made aware. MD consulted ICU.

## 2022-11-22 NOTE — Progress Notes (Signed)
  Carryover admission to the Day Admitter.  I discussed this case with the EDP, Dr. Blinda Leatherwood.  Per these discussions:   This is a 52 year old female with history of ischemic CVA complicated by residual spastic left hemiparesis, who is being admitted with suspected acute metabolic encephalopathy in the setting of AKI, dehydration, after presenting with increased somnolence, confusion over the course the last day.  She also has been experiencing recurrent nausea/vomiting, refractory to as needed IV antiemetics administered in the ED.  She reportedly had a very similar presentation a few weeks ago, which was felt to be on the basis of acute metabolic encephalopathy from AKI, dehydration, with mental status improving to baseline with interval IV fluids, rehydration, and associated ensuing improvement in renal function.  Today, serum creatinine noted to be around 2, with baseline of approximately 1.  CT head showed no evidence of acute process.  Chest x-ray was suggestive of atelectasis, although infiltrate cannot be ruled out.  This initially prompted receipt of doses of Rocephin and azithromycin.  Ensuing CT chest, abdomen, pelvis showed no evidence of acute cardiopulmonary process, including no evidence of infiltrate.  I have placed an order for inpatient admission for further evaluation management of the above.  I have placed some additional preliminary admit orders via the adult multi-morbid admission order set. I have also ordered n.p.o. for now in the setting of recurrent nausea/vomiting.  Have added on serum magnesium level and ordered prn IV Zofran as well as as needed IV Ativan for nausea/vomiting refractory to Zofran.  I have ordered lactated Ringer's at 125 cc/h x 8 hours.    Newton Pigg, DO Hospitalist

## 2022-11-22 NOTE — ED Notes (Signed)
Hospital bed ordered.

## 2022-11-22 NOTE — ED Notes (Signed)
Patient changed into clean gown and linen changed. Medium about of emesis around patient's neck and shoulders.

## 2022-11-22 NOTE — ED Notes (Signed)
Patient agitated and not following commands with consistent coughing, unable to perform swallow exam at this time.

## 2022-11-22 NOTE — ED Notes (Addendum)
MD aware of pt. Pulling out NG tube. No new orders at this time

## 2022-11-22 NOTE — Progress Notes (Addendum)
Pharmacy Antibiotic Note  Lori [name] is a 52 y.o. female for which pharmacy has been consulted for cefepime dosing for sepsis.  Patient with a history of CVA with left hemiparesis/spasticity/aphasia, T2DM, CAD, HF, HTN, hyperthyroidism, anxiety and depression . Patient presenting with AMS.  SCr 1.96>1.53 - AKI WBC 7.3>12.6; LA 3.5>2.4; T 99.5; HR 134; RR 30 COVID neg / flu neg  Plan: Linezolid per MD Cefepime 2g q12hr  Monitor WBC, fever, renal function, cultures De-escalate when able     Temp (24hrs), Avg:98 F (36.7 C), Min:96.8 F (36 C), Max:99.5 F (37.5 C)  Recent Labs  Lab 11/22/22 0019 11/22/22 0053 11/22/22 0352 11/22/22 0920  WBC 7.3  --   --  12.6*  CREATININE 1.96*  --   --  1.53*  LATICACIDVEN  --  2.5* 3.5* 2.4*    CrCl cannot be calculated (Unknown ideal weight.).    Allergies  Allergen Reactions   Asa [Aspirin] Anaphylaxis and Swelling    Throat closes   Contrast Media [Iodinated Contrast Media] Anaphylaxis and Swelling   Imitrex [Sumatriptan] Anaphylaxis   Penicillins Swelling    Mouth and eye swelling   Firvanq [Vancomycin] Other (See Comments)    Red Man's Syndrome   Cipro [Ciprofloxacin Hcl] Nausea And Vomiting   Ms Contin [Morphine] Swelling   Wound Dressing Adhesive Itching and Rash   Antimicrobials this admission: Ceftriaxone/azith x1 in ED 10/18 Linezolid 10/18 >>  Cefepime 10/18 >>   Microbiology results: Pending  Thank you for allowing pharmacy to be a part of this patient's care.  Delmar Landau, PharmD, BCPS 11/22/2022 1:36 PM ED Clinical Pharmacist -  [phone]

## 2022-11-22 NOTE — ED Notes (Signed)
Pt had an episode of emesis, clean and changed. See MAR.

## 2022-11-22 NOTE — ED Notes (Signed)
Messaged hospitalist in regards to patient behavior, no new orders at this time. Pt is screaming and is not redirectable.

## 2022-11-22 NOTE — ED Notes (Signed)
Patient linen, gown, and covers changed. Pt had a large episode of emesis.

## 2022-11-22 NOTE — ED Notes (Signed)
Provider notified patient still having coffee ground emesis, no new orders.

## 2022-11-22 NOTE — ED Notes (Addendum)
Following handoff, this RN went to bedside to collect lab work ordered and obtain IV access. Pt. Appears difficult to arouse and altered. MD Pollina called to bedside along with RT. Lab work collected, straight cath preformed, and IV obtained. No new orders at this time. Airway intact.

## 2022-11-22 NOTE — ED Notes (Signed)
Patient yelling in room her last name. Pt is altered and heart rate and breathing are elevated when she is awake. Pt then falls immediately back asleep after redirection is attempted.

## 2022-11-22 NOTE — Consult Note (Signed)
NAME:  Lori [name], MRN:  [id number], DOB:  1970/11/01, LOS: 0 ADMISSION DATE:  11/21/2022, CONSULTATION DATE:  11/22/22 REFERRING MD:  Alanda Slim CHIEF COMPLAINT:  Hypotension   History of Present Illness:  Lori [name] is a 52 y.o. female who has a PMH as below including but not limited to CVA with residual L hemiparesis/spasticity/dysphasia, DM2, CAD, dCHF, HTN, anxiety, depression. She presented to [facility] ED 10/17 with AMS, N/V. Workup in ED suggestive of dehydration, AKI and she had positive gastric hemoccult (though Hgb 11).  PM 10/18 after admission, she had some vomiting episodes followed by sudden drop in BP from 140's down to 60's. IVF bolus was ordered by IV infiltrated. She had US guided IV placed by ED RN and started on fluids. After 400cc fluid, SBP improved back to 120-130s. She complains of thirst and back pain.   Prior to her response to IVF, PCCM was consulted given drop in BP.  Pertinent  Medical History:  has History of CVA with spastic paresis (cerebrovascular accident); Tachycardia; Diabetes (HCC); Hyperthyroidism; Essential hypertension; CAD (coronary artery disease); Pheochromocytoma; Anxiety and depression; HLD (hyperlipidemia); Spastic hemiparesis of left 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 [facility]); Aphasia; Spell of abnormal behavior; Weakness; Insulin dependent type 2 diabetes mellitus (HCC); DKA (diabetic ketoacidosis) (HCC); Debility; History of cerebral infarction; Diabetic gastroparesis (HCC); Migraines; MDD (major depressive disorder), recurrent episode, severe (HCC); Functional urinary incontinence; Hypothyroidism; Acute bronchitis; Nausea & vomiting; Dyspnea; GERD (gastroesophageal reflux disease); Wheelchair dependence; Spasticity as late effect of cerebrovascular accident (CVA); Left hemiplegia (HCC); Hyponatremia; Hyperkalemia; AKI (acute kidney injury) (HCC); Lethargy; UTI (urinary tract infection); Mild intermittent asthma, uncomplicated; Chest  pain of uncertain etiology; History of CAD (coronary artery disease); GAD (generalized anxiety disorder); Reactive airway disease; Acute pancreatitis; Morbid obesity (HCC); Aspiration pneumonia (HCC); Chronic heart failure with preserved ejection fraction (HFpEF) (HCC); Normocytic anemia; Acute encephalopathy; Chronic anticoagulation; Hypotension; Acute metabolic encephalopathy; and Delirium on their problem list.  Significant Hospital Events: Including procedures, antibiotic start and stop dates in addition to other pertinent events   10/18 admit  Interim History / Subjective:  Asking for water and complains of back pain. When asked what else is wrong, she says "nothing, I'm fine".  Objective:  Blood pressure (!) 96/56, pulse (!) 134, temperature 99.5 F (37.5 C), temperature source Oral, resp. rate (!) 30, SpO2 100%.        Intake/Output Summary (Last 24 hours) at 11/22/2022 1338 Last data filed at 11/22/2022 0746 Gross per 24 hour  Intake 260.39 ml  Output --  Net 260.39 ml   There were no vitals filed for this visit.  Examination: General: Adult female, resting in bed, in NAD. Neuro: Awake, underlying residual L hemiparesis and aphasia from prior CVA. HEENT: [place]/AT. Sclerae anicteric. MM dry. Cardiovascular: Tachy, regular, no M/R/G.  Lungs: Respirations even and unlabored.  CTA bilaterally, No W/R/R. Abdomen: Obese. BS x 4, soft, NT/ND.  Musculoskeletal: No gross deformities, 1+ edema.  Skin: Intact, warm, no rashes.  Labs/imaging personally reviewed:  CT head 10/18 > neg. CT C/A/P 10/18 > neg.  Assessment & Plan:   Dehydration with AKI - responding to IVF's gradually. Still quite volume deplete. - Push IVF and oral hydration, discussed with primary team.  N/V. - Antiemetics per primary team.  Gastric hemmocult positive - presumed from hx N/V. Perhaps small mallory weis? Hgb is reassuring. - Supportive care.  Sinus tach - 2/2 above. - Supportive care as  above.  Rest per primary team.  Discussed with Dr. Alanda Slim. Nothing else to add. Please call back if we can be of further assistance.  Best practice (evaluated daily):  Per primary.  Labs   CBC: Recent Labs  Lab 11/22/22 0015 11/22/22 0019 11/22/22 0920  WBC  --  7.3 12.6*  NEUTROABS  --  5.4  --   HGB 12.6 12.6 11.1*  HCT 37.0 38.6 32.7*  MCV  --  81.6 78.4*  PLT  --  386 425*    Basic Metabolic Panel: Recent Labs  Lab 11/22/22 0015 11/22/22 0019 11/22/22 0920  NA 133* 131* 137  K 4.0 5.1 4.0  CL  --  98 102  CO2  --  16* 20*  GLUCOSE  --  366* 379*  BUN  --  27* 23*  CREATININE  --  1.96* 1.53*  CALCIUM  --  8.8* 8.7*  MG  --   --  1.7  PHOS  --   --  3.1   GFR: CrCl cannot be calculated (Unknown ideal weight.). Recent Labs  Lab 11/22/22 0019 11/22/22 0053 11/22/22 0352 11/22/22 0920  WBC 7.3  --   --  12.6*  LATICACIDVEN  --  2.5* 3.5* 2.4*    Liver Function Tests: Recent Labs  Lab 11/22/22 0019 11/22/22 0920  AST 28  --   ALT 16  --   ALKPHOS 78  --   BILITOT 1.2  --   PROT 7.3  --   ALBUMIN 3.7 3.3*   Recent Labs  Lab 11/22/22 0019  LIPASE 25   Recent Labs  Lab 11/22/22 0920  AMMONIA 45*    ABG    Component Value Date/Time   PHART 7.359 11/22/2022 0015   PCO2ART 36.2 11/22/2022 0015   PO2ART 84 11/22/2022 0015   HCO3 20.7 11/22/2022 0015   TCO2 22 11/22/2022 0015   ACIDBASEDEF 5.0 (H) 11/22/2022 0015   O2SAT 96 11/22/2022 0015     Coagulation Profile: Recent Labs  Lab 11/22/22 0019  INR 1.1    Cardiac Enzymes: No results for input(s): "CKTOTAL", "CKMB", "CKMBINDEX", "TROPONINI" in the last 168 hours.  HbA1C: Hgb A1c MFr Bld  Date/Time Value Ref Range Status  08/27/2022 09:20 AM 9.8 (H) <5.7 % of total Hgb Final    Comment:    For someone without known diabetes, a hemoglobin A1c value of 6.5% or greater indicates that they may have  diabetes and this should be confirmed with a follow-up  test. . For someone  with known diabetes, a value <7% indicates  that their diabetes is well controlled and a value  greater than or equal to 7% indicates suboptimal  control. A1c targets should be individualized based on  duration of diabetes, age, comorbid conditions, and  other considerations. . Currently, no consensus exists regarding use of hemoglobin A1c for diagnosis of diabetes for children. Marland Kitchen   05/20/2022 10:53 AM 11.3 (H) <5.7 % of total Hgb Final    Comment:    For someone without known diabetes, a hemoglobin A1c value of 6.5% or greater indicates that they may have  diabetes and this should be confirmed with a follow-up  test. . For someone with known diabetes, a value <7% indicates  that their diabetes is well controlled and a value  greater than or equal to 7% indicates suboptimal  control. A1c targets should be individualized based on  duration of diabetes, age, comorbid conditions, and  other considerations. . Currently, no consensus exists regarding use of hemoglobin A1c  for diagnosis of diabetes for children. . . This test was performed on the Roche cobas c503 platform. Effective 11/12/21, a change in test platforms from the [employer] Architect to the Roche cobas c503 may have shifted HbA1c results compared to historical results. Based on laboratory validation testing conducted at [facility], the Roche platform relative to the Abbott platform had an average increase in HbA1c value of < or = 0.3%. This difference is within accepted  variability established by the [facility]. Note that not all individuals will have had a shift in their results and direct comparisons between historical and current results for testing conducted on different platforms is not recommended.     CBG: Recent Labs  Lab 11/21/22 2358 11/22/22 0901 11/22/22 1207  GLUCAP 345* 357* 276*    Review of Systems:   All negative; except for those that are bolded, which  indicate positives.  Constitutional: weight loss, weight gain, night sweats, fevers, chills, fatigue, weakness, thirsty. HEENT: headaches, sore throat, sneezing, nasal congestion, post nasal drip, difficulty swallowing, tooth/dental problems, visual complaints, visual changes, ear aches. Neuro: difficulty with speech, weakness, numbness, ataxia. CV:  chest pain, orthopnea, PND, swelling in lower extremities, dizziness, palpitations, syncope.  Resp: cough, hemoptysis, dyspnea, wheezing. GI: heartburn, indigestion, abdominal pain, nausea, vomiting, diarrhea, constipation, change in bowel habits, loss of appetite, hematemesis, melena, hematochezia.  GU: dysuria, change in color of urine, urgency or frequency, flank pain, hematuria. MSK: joint pain or swelling, decreased range of motion, back pain. Psych: change in mood or affect, depression, anxiety, suicidal ideations, homicidal ideations. Skin: rash, itching, bruising.   Past Medical History:  She,  has a past medical history of CAD (coronary artery disease), Depression, Diabetes mellitus without complication (HCC), History of CT scan, History of mammogram, History of MRI, Hypertension, Pheochromocytoma, Stroke (HCC), and Thyroid disease.   Surgical History:   Past Surgical History:  Procedure Laterality Date   ABDOMINAL HYSTERECTOMY     CESAREAN SECTION     3   CORONARY ARTERY BYPASS GRAFT     Right adrenal gland removal for pheochromocytoma Right      Social History:   reports that she has never smoked. She has never used smokeless tobacco. She reports that she does not currently use alcohol. She reports that she does not use drugs.   Family History:  Her family history includes Asthma in her daughter; Atrial fibrillation in her maternal uncle and mother; Brain cancer in her brother; Dementia in her father; Diabetes in her brother and mother; Heart attack in her father; Hypertension in her father and mother; Sickle cell anemia in her  son.   Allergies Allergies  Allergen Reactions   Asa [Aspirin] Anaphylaxis and Swelling    Throat closes   Contrast Media [Iodinated Contrast Media] Anaphylaxis and Swelling   Imitrex [Sumatriptan] Anaphylaxis   Penicillins Swelling    Mouth and eye swelling   Firvanq [Vancomycin] Other (See Comments)    Red Man's Syndrome   Cipro [Ciprofloxacin Hcl] Nausea And Vomiting   Ms Contin [Morphine] Swelling   Wound Dressing Adhesive Itching and Rash     Home Medications  Prior to Admission medications   Medication Sig Start Date End Date Taking? Authorizing Provider  amLODipine (NORVASC) 5 MG tablet Take 5 mg by mouth daily. 11/04/22  Yes [provider]  gabapentin (NEURONTIN) 100 MG capsule Take 100 mg by mouth 3 (three) times daily. 11/14/22  Yes [provider]  OZEMPIC, 0.25 OR 0.5 MG/DOSE, 2  MG/3ML SOPN Inject 0.5 mg into the skin once a week. 11/18/22  Yes [provider]  tamsulosin (FLOMAX) 0.4 MG CAPS capsule Take 0.4 mg by mouth at bedtime. 11/04/22  Yes [provider]  albuterol (PROVENTIL) (2.5 MG/3ML) 0.083% nebulizer solution Take 3 mLs (2.5 mg total) by nebulization every 6 (six) hours as needed for wheezing or shortness of breath. 05/21/22   Ngetich, Dinah C, NP  albuterol (VENTOLIN HFA) 108 (90 Base) MCG/ACT inhaler INHALE 2 PUFFS BY MOUTH EVERY 6 HOURS AS NEEDED FOR WHEEZING AND FOR SHORTNESS OF BREATH 10/17/22   Ngetich, Dinah C, NP  atorvastatin (LIPITOR) 80 MG tablet Take 1 tablet (80 mg total) by mouth daily. Patient taking differently: Take 80 mg by mouth at bedtime. 09/02/22   Ngetich, Dinah C, NP  baclofen (LIORESAL) 10 MG tablet TAKE 1 TABLET BY MOUTH 3 TIMES DAILY AT 9AM, 1PM AND 5PM FOR SPASTICITY 11/18/22   Lovorn, Megan, MD  blood glucose meter kit and supplies 1 each by Other route as directed. Dispense based on patient and insurance preference. Use up to four times daily as directed. (FOR ICD-10 E10.9, E11.9). 06/24/22   Ngetich,  Dinah C, NP  budesonide (PULMICORT) 0.25 MG/2ML nebulizer solution Take 2 mLs (0.25 mg total) by nebulization 2 (two) times daily. 05/21/22   Ngetich, Dinah C, NP  clopidogrel (PLAVIX) 75 MG tablet Take 1 tablet (75 mg total) by mouth daily. 09/02/22   Ngetich, Donalee Citrin, NP  Continuous Glucose Receiver (FREESTYLE LIBRE 2 READER) DEVI 1 Device by Does not apply route daily. E11.69 11/01/22   Ngetich, Dinah C, NP  Continuous Glucose Sensor (FREESTYLE LIBRE 14 DAY SENSOR) MISC 1 each by Does not apply route as needed. DX: E11.69 08/06/22   Sharon Seller, NP  cyclobenzaprine (FLEXERIL) 5 MG tablet TAKE 1 TABLET BY MOUTH 3 TIMES DAILY AS NEEDED FOR MUSCLE SPASMS 11/18/22   Lovorn, Aundra Millet, MD  dicyclomine (BENTYL) 20 MG tablet Take 1 tablet (20 mg total) by mouth 2 (two) times daily. 10/31/22   Elayne Snare K, DO  escitalopram (LEXAPRO) 20 MG tablet Take 1 tablet (20 mg total) by mouth every morning. 09/02/22   Ngetich, Dinah C, NP  ezetimibe (ZETIA) 10 MG tablet Take 1 tablet (10 mg total) by mouth daily. Patient taking differently: Take 10 mg by mouth at bedtime. 09/02/22   Ngetich, Dinah C, NP  insulin aspart (NOVOLOG) 100 UNIT/ML injection Inject 15 Units into the skin 3 (three) times daily with meals. 11/01/22   Ngetich, Dinah C, NP  Insulin Pen Needle 32G X 4 MM MISC Use to inject insulin 4 times daily as directed. 05/21/22   Ngetich, Dinah C, NP  LANTUS SOLOSTAR 100 UNIT/ML Solostar Pen INJECT 40 UNITS SUBCUTANEOUSLY AT BEDTIME Patient taking differently: Inject 40 Units into the skin at bedtime. 08/21/22   Ngetich, Dinah C, NP  LORazepam (ATIVAN) 0.5 MG tablet Take 1 tablet (0.5 mg total) by mouth at bedtime. 08/27/22   Ngetich, Dinah C, NP  methimazole (TAPAZOLE) 5 MG tablet Take 1 tablet (5 mg total) by mouth every morning. 09/16/22   Ngetich, Dinah C, NP  metoprolol tartrate (LOPRESSOR) 100 MG tablet Take 100 mg by mouth 2 (two) times daily.    [provider]  nystatin (MYCOSTATIN/NYSTOP)  powder Apply 1 Application topically 2 (two) times daily as needed (irritation under breast/skin folds).    [provider]  olmesartan-hydrochlorothiazide (BENICAR HCT) 40-25 MG tablet Take 1 tablet by mouth daily.  [provider]  omeprazole (PRILOSEC) 20 MG capsule Take 1 capsule (20 mg total) by mouth daily. 09/02/22   Ngetich, Dinah C, NP  polyethylene glycol powder (GLYCOLAX/MIRALAX) 17 GM/SCOOP powder Take 17 g by mouth daily. Hold for loose stool Patient taking differently: Take 17 g by mouth daily as needed (constipation). 06/27/22   Ngetich, Dinah C, NP  ranolazine (RANEXA) 500 MG 12 hr tablet Take 1 tablet (500 mg total) by mouth 2 (two) times daily. 10/09/22 01/07/23  [place], Alvira Philips, DO  rivaroxaban (XARELTO) 10 MG TABS tablet Take 1 tablet (10 mg total) by mouth daily. 09/02/22   Ngetich, Dinah C, NP  Semaglutide,0.25 or 0.5MG /DOS, (OZEMPIC, 0.25 OR 0.5 MG/DOSE,) 2 MG/1.5ML SOPN Inject 0.5 mg into the skin once a week. Patient taking differently: Inject 0.5 mg into the skin every Sunday. 11/01/22   Ngetich, Dinah C, NP  senna-docusate (SENOKOT-S) 8.6-50 MG tablet Take 1 tablet by mouth daily. Patient taking differently: Take 1 tablet by mouth daily as needed (constipation). 10/31/22   Elayne Snare K, DO  topiramate (TOPAMAX) 25 MG tablet Take 1 tablet (25 mg total) by mouth 2 (two) times daily. 07/10/22   Maurilio Lovely D, DO     Critical care time: N/A   Rutherford Guys, PA - [facility] For pager details, please see AMION or use Epic chat  After 1900, please call [facility] for cross coverage needs 11/22/2022, 1:38 PM

## 2022-11-22 NOTE — ED Notes (Signed)
Family at bedside. 

## 2022-11-22 NOTE — Progress Notes (Signed)
  This encounter was created in error - please disregard. No show 

## 2022-11-22 NOTE — Inpatient Diabetes Management (Signed)
Inpatient Diabetes Program Recommendations  AACE/ADA: New Consensus Statement on Inpatient Glycemic Control (2015)  Target Ranges:  Prepandial:   less than 140 mg/dL      Peak postprandial:   less than 180 mg/dL (1-2 hours)      Critically ill patients:  140 - 180 mg/dL   Lab Results  Component Value Date   GLUCAP 357 (H) 11/22/2022   HGBA1C 9.8 (H) 08/27/2022    Review of Glycemic Control  Latest Reference Range & Units 11/21/22 23:58 11/22/22 09:01  Glucose-Capillary 70 - 99 mg/dL 161 (H) 096 (H)  (H): Data is abnormally high Diabetes history: Type 2 DM Outpatient Diabetes medications: Novolog 15 units TID, Lantus 40 units at bedtime, Ozempic 0.5 mg qwk Current orders for Inpatient glycemic control: Novolog 0-9 units Q4H   Inpatient Diabetes Program Recommendations:    Consider adding Semglee 20 units at bedtime.   Thanks, Lujean Rave, MSN, RNC-OB Diabetes Coordinator [phone] (8a-5p)

## 2022-11-23 ENCOUNTER — Other Ambulatory Visit: Payer: Self-pay

## 2022-11-23 DIAGNOSIS — G9341 Metabolic encephalopathy: Secondary | ICD-10-CM | POA: Diagnosis not present

## 2022-11-23 DIAGNOSIS — I5032 Chronic diastolic (congestive) heart failure: Secondary | ICD-10-CM | POA: Diagnosis not present

## 2022-11-23 DIAGNOSIS — N179 Acute kidney failure, unspecified: Secondary | ICD-10-CM | POA: Diagnosis not present

## 2022-11-23 DIAGNOSIS — R112 Nausea with vomiting, unspecified: Secondary | ICD-10-CM | POA: Diagnosis not present

## 2022-11-23 LAB — GLUCOSE, CAPILLARY
Glucose-Capillary: 189 mg/dL — ABNORMAL HIGH (ref 70–99)
Glucose-Capillary: 238 mg/dL — ABNORMAL HIGH (ref 70–99)
Glucose-Capillary: 274 mg/dL — ABNORMAL HIGH (ref 70–99)

## 2022-11-23 LAB — COMPREHENSIVE METABOLIC PANEL
ALT: 14 U/L (ref 0–44)
AST: 14 U/L — ABNORMAL LOW (ref 15–41)
Albumin: 3.4 g/dL — ABNORMAL LOW (ref 3.5–5.0)
Alkaline Phosphatase: 58 U/L (ref 38–126)
Anion gap: 11 (ref 5–15)
BUN: 14 mg/dL (ref 6–20)
CO2: 20 mmol/L — ABNORMAL LOW (ref 22–32)
Calcium: 8.8 mg/dL — ABNORMAL LOW (ref 8.9–10.3)
Chloride: 108 mmol/L (ref 98–111)
Creatinine, Ser: 0.89 mg/dL (ref 0.44–1.00)
GFR, Estimated: >60 mL/min (ref >60)
Glucose, Bld: 175 mg/dL — ABNORMAL HIGH (ref 70–99)
Potassium: 3.4 mmol/L — ABNORMAL LOW (ref 3.5–5.1)
Sodium: 139 mmol/L (ref 135–145)
Total Bilirubin: 0.5 mg/dL (ref 0.3–1.2)
Total Protein: 6.8 g/dL (ref 6.5–8.1)

## 2022-11-23 LAB — CBG MONITORING, ED
Glucose-Capillary: 169 mg/dL — ABNORMAL HIGH (ref 70–99)
Glucose-Capillary: 172 mg/dL — ABNORMAL HIGH (ref 70–99)
Glucose-Capillary: 177 mg/dL — ABNORMAL HIGH (ref 70–99)
Glucose-Capillary: 178 mg/dL — ABNORMAL HIGH (ref 70–99)
Glucose-Capillary: 181 mg/dL — ABNORMAL HIGH (ref 70–99)
Glucose-Capillary: 194 mg/dL — ABNORMAL HIGH (ref 70–99)

## 2022-11-23 LAB — CBC
HCT: 31.6 % — ABNORMAL LOW (ref 36.0–46.0)
Hemoglobin: 10.2 g/dL — ABNORMAL LOW (ref 12.0–15.0)
MCH: 26.1 pg (ref 26.0–34.0)
MCHC: 32.3 g/dL (ref 30.0–36.0)
MCV: 80.8 fL (ref 80.0–100.0)
Platelets: 365 10*3/uL (ref 150–400)
RBC: 3.91 MIL/uL (ref 3.87–5.11)
RDW: 16.5 % — ABNORMAL HIGH (ref 11.5–15.5)
WBC: 8.3 10*3/uL (ref 4.0–10.5)
nRBC: 0 % (ref 0.0–0.2)

## 2022-11-23 LAB — HEMOGLOBIN AND HEMATOCRIT, BLOOD
HCT: 30.8 % — ABNORMAL LOW (ref 36.0–46.0)
HCT: 31.5 % — ABNORMAL LOW (ref 36.0–46.0)
Hemoglobin: 10.2 g/dL — ABNORMAL LOW (ref 12.0–15.0)
Hemoglobin: 10.5 g/dL — ABNORMAL LOW (ref 12.0–15.0)

## 2022-11-23 LAB — TSH: TSH: 0.88 u[IU]/mL (ref 0.350–4.500)

## 2022-11-23 LAB — LACTIC ACID, PLASMA: Lactic Acid, Venous: 1.1 mmol/L (ref 0.5–1.9)

## 2022-11-23 LAB — TROPONIN I (HIGH SENSITIVITY)
Troponin I (High Sensitivity): 6 ng/L (ref <18)
Troponin I (High Sensitivity): 6 ng/L (ref <18)

## 2022-11-23 LAB — PHOSPHORUS: Phosphorus: 1.7 mg/dL — ABNORMAL LOW (ref 2.5–4.6)

## 2022-11-23 LAB — BETA-HYDROXYBUTYRIC ACID: Beta-Hydroxybutyric Acid: 0.77 mmol/L — ABNORMAL HIGH (ref 0.05–0.27)

## 2022-11-23 LAB — MAGNESIUM: Magnesium: 1.6 mg/dL — ABNORMAL LOW (ref 1.7–2.4)

## 2022-11-23 MED ORDER — EZETIMIBE 10 MG PO TABS
10.0000 mg | ORAL_TABLET | Freq: Every day | ORAL | Status: DC
  Administered 2022-11-23 – 2022-12-06 (×14): 10 mg via ORAL
  Filled 2022-11-23 (×14): qty 1

## 2022-11-23 MED ORDER — RANOLAZINE ER 500 MG PO TB12
500.0000 mg | ORAL_TABLET | Freq: Two times a day (BID) | ORAL | Status: DC
  Administered 2022-11-23 – 2022-12-06 (×25): 500 mg via ORAL
  Filled 2022-11-23 (×28): qty 1

## 2022-11-23 MED ORDER — ALBUTEROL SULFATE (2.5 MG/3ML) 0.083% IN NEBU: 2.5000 mg | INHALATION_SOLUTION | Freq: Four times a day (QID) | RESPIRATORY_TRACT | Status: DC | PRN

## 2022-11-23 MED ORDER — BUDESONIDE 0.25 MG/2ML IN SUSP
0.2500 mg | Freq: Two times a day (BID) | RESPIRATORY_TRACT | Status: DC
  Administered 2022-11-24 – 2022-12-06 (×19): 0.25 mg via RESPIRATORY_TRACT
  Filled 2022-11-23 (×26): qty 2

## 2022-11-23 MED ORDER — METOPROLOL TARTRATE 50 MG PO TABS
50.0000 mg | ORAL_TABLET | Freq: Two times a day (BID) | ORAL | Status: DC
  Administered 2022-11-23 – 2022-12-06 (×23): 50 mg via ORAL
  Filled 2022-11-23 (×25): qty 1

## 2022-11-23 MED ORDER — BACLOFEN 10 MG PO TABS
10.0000 mg | ORAL_TABLET | Freq: Three times a day (TID) | ORAL | Status: DC
  Administered 2022-11-23 – 2022-12-06 (×39): 10 mg via ORAL
  Filled 2022-11-23 (×40): qty 1

## 2022-11-23 MED ORDER — LABETALOL HCL 5 MG/ML IV SOLN
10.0000 mg | INTRAVENOUS | Status: DC | PRN
  Administered 2022-11-24: 10 mg via INTRAVENOUS
  Filled 2022-11-23 (×2): qty 4

## 2022-11-23 MED ORDER — POTASSIUM PHOSPHATES 15 MMOLE/5ML IV SOLN
30.0000 mmol | Freq: Once | INTRAVENOUS | Status: AC
  Administered 2022-11-23: 30 mmol via INTRAVENOUS
  Filled 2022-11-23: qty 10

## 2022-11-23 MED ORDER — METHIMAZOLE 5 MG PO TABS
5.0000 mg | ORAL_TABLET | Freq: Every morning | ORAL | Status: DC
  Administered 2022-11-23 – 2022-11-25 (×3): 5 mg via ORAL
  Filled 2022-11-23 (×3): qty 1

## 2022-11-23 MED ORDER — ATORVASTATIN CALCIUM 80 MG PO TABS
80.0000 mg | ORAL_TABLET | Freq: Every day | ORAL | Status: DC
  Administered 2022-11-23 – 2022-12-06 (×14): 80 mg via ORAL
  Filled 2022-11-23 (×8): qty 1
  Filled 2022-11-23: qty 2
  Filled 2022-11-23 (×5): qty 1

## 2022-11-23 MED ORDER — MAGNESIUM SULFATE 2 GM/50ML IV SOLN
2.0000 g | Freq: Once | INTRAVENOUS | Status: AC
  Administered 2022-11-23: 2 g via INTRAVENOUS
  Filled 2022-11-23: qty 50

## 2022-11-23 MED ORDER — METOPROLOL TARTRATE 25 MG PO TABS
25.0000 mg | ORAL_TABLET | Freq: Two times a day (BID) | ORAL | Status: DC
  Administered 2022-11-23: 25 mg via ORAL
  Filled 2022-11-23: qty 1

## 2022-11-23 MED ORDER — CHLORHEXIDINE GLUCONATE CLOTH 2 % EX PADS
6.0000 | MEDICATED_PAD | Freq: Every day | CUTANEOUS | Status: DC
  Administered 2022-11-23 – 2022-12-06 (×15): 6 via TOPICAL

## 2022-11-23 MED ORDER — TAMSULOSIN HCL 0.4 MG PO CAPS
0.4000 mg | ORAL_CAPSULE | Freq: Every day | ORAL | Status: DC
  Administered 2022-11-23 – 2022-12-05 (×12): 0.4 mg via ORAL
  Filled 2022-11-23 (×13): qty 1

## 2022-11-23 MED ORDER — ESCITALOPRAM OXALATE 10 MG PO TABS
20.0000 mg | ORAL_TABLET | Freq: Every morning | ORAL | Status: DC
  Administered 2022-11-23 – 2022-12-06 (×14): 20 mg via ORAL
  Filled 2022-11-23 (×14): qty 2

## 2022-11-23 MED ORDER — GABAPENTIN 100 MG PO CAPS
100.0000 mg | ORAL_CAPSULE | Freq: Three times a day (TID) | ORAL | Status: DC
  Administered 2022-11-23 – 2022-12-06 (×39): 100 mg via ORAL
  Filled 2022-11-23 (×40): qty 1

## 2022-11-23 MED ORDER — SODIUM CHLORIDE 0.9% FLUSH
10.0000 mL | Freq: Two times a day (BID) | INTRAVENOUS | Status: DC
  Administered 2022-11-24: 20 mL
  Administered 2022-11-24: 10 mL
  Administered 2022-11-25: 20 mL
  Administered 2022-11-25 – 2022-11-27 (×5): 10 mL
  Administered 2022-11-28: 20 mL
  Administered 2022-11-28 – 2022-12-02 (×8): 10 mL
  Administered 2022-12-02: 20 mL
  Administered 2022-12-03 – 2022-12-06 (×7): 10 mL

## 2022-11-23 MED ORDER — SODIUM CHLORIDE 0.9% FLUSH: 10.0000 mL | INTRAVENOUS | Status: DC | PRN

## 2022-11-23 MED ORDER — TOPIRAMATE 25 MG PO TABS
25.0000 mg | ORAL_TABLET | Freq: Two times a day (BID) | ORAL | Status: DC
  Administered 2022-11-23 – 2022-12-06 (×26): 25 mg via ORAL
  Filled 2022-11-23 (×27): qty 1

## 2022-11-23 NOTE — Progress Notes (Signed)
Pt unable to answer admission questions at this time.

## 2022-11-23 NOTE — Plan of Care (Signed)
  Problem: Health Behavior/Discharge Planning: Goal: Ability to identify and utilize available resources and services will improve Outcome: Progressing   Problem: Metabolic: Goal: Ability to maintain appropriate glucose levels will improve Outcome: Progressing   Problem: Skin Integrity: Goal: Risk for impaired skin integrity will decrease Outcome: Progressing   Problem: Tissue Perfusion: Goal: Adequacy of tissue perfusion will improve Outcome: Progressing   Problem: Elimination: Goal: Will not experience complications related to urinary retention Outcome: Progressing   Problem: Skin Integrity: Goal: Risk for impaired skin integrity will decrease Outcome: Progressing

## 2022-11-23 NOTE — Progress Notes (Signed)
Peripherally Inserted Central Catheter Placement  The IV Nurse has discussed with the patient and/or persons authorized to consent for the patient, the purpose of this procedure and the potential benefits and risks involved with this procedure.  The benefits include less needle sticks, lab draws from the catheter, and the patient may be discharged home with the catheter. Risks include, but not limited to, infection, bleeding, blood clot (thrombus formation), and puncture of an artery; nerve damage and irregular heartbeat and possibility to perform a PICC exchange if needed/ordered by physician.  Alternatives to this procedure were also discussed.  Bard Power PICC patient education guide, fact sheet on infection prevention and patient information card has been provided to patient /or left at bedside.  Consent obtained by ED RN from husband at bedside.  PICC Placement Documentation  PICC Double Lumen 11/23/22 Right Basilic 37 cm 1 cm (Active)  Indication for Insertion or Continuance of Line Limited venous access - need for IV therapy >5 days (PICC only) 11/23/22 1752  Exposed Catheter (cm) 1 cm 11/23/22 1752  Site Assessment Clean, Dry, Intact 11/23/22 1752  Lumen #1 Status Flushed;Saline locked;Blood return noted 11/23/22 1752  Lumen #2 Status Flushed;Saline locked;Blood return noted 11/23/22 1752  Dressing Type Transparent;Securing device 11/23/22 1752  Dressing Status Antimicrobial disc in place;Clean, Dry, Intact 11/23/22 1752  Line Care Connections checked and tightened 11/23/22 1752  Line Adjustment (NICU/IV Team Only) No 11/23/22 1752  Dressing Intervention New dressing 11/23/22 1752  Dressing Change Due 11/30/22 11/23/22 1752       Elliot Dally 11/23/2022, 5:52 PM
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
## 2022-11-23 NOTE — ED Notes (Signed)
Attempting to call husband.  Continues to say mailbox is full.  Notified MD and IV team.

## 2022-11-23 NOTE — Progress Notes (Signed)
PROGRESS NOTE  Juanisha Steuer Becton ZOX:[id number] DOB: Jun 12, 1970   PCP: Caesar Bookman, NP  Patient is from: Home.  Lives with husband.  Uses wheelchair at baseline.  DOA: 11/21/2022 LOS: 1  Chief complaints Chief Complaint  Patient presents with   Altered Mental Status     Brief Narrative / Interim history: 52 y.o. female with PMH of CVA with left hemiparesis/spasticity/aphasia, IDDM-2, CAD, diastolic CHF, HTN, hyperthyroidism, anxiety and depression presenting with behavioral change, increased somnolence and nausea and vomiting, and admitted with acute metabolic encephalopathy, AKI, intractable nausea and vomiting, hematemesis and tachycardia.   In ED, HR ranges from 100s to 150s.  Soft BP.  100% on RA.  ABG basically normal. Na 131.  BMP glucose 366.  Bicarb 16.  Anion gap 17. Cr 1.96 (0.98 on 10/2) at.  BUN 27.  LFT normal.  Limited RVP CBC, UA, UDS, serial troponin, coag labs, CT chest, abdomen and pelvis without significant finding.  Gastric Hemoccult positive.  Lactic acid was elevated.  Patient was started on IV fluid.  Blood cultures obtained.  Started on IV fluid, broad-spectrum antibiotics, IV Protonix and antiemetics and admitted.   Blood cultures NGTD.  Lactic acid resolved.  Still with tachycardia but improved to 110s.  Hypotension resolved.  BP elevated.   Subjective: Seen and examined earlier this morning.  No major events overnight of this morning.  She had some episodes of nausea and vomiting earlier in the night.  Has no complaint this morning.  Objective: Vitals:   11/23/22 0900 11/23/22 0930 11/23/22 0945 11/23/22 1300  BP: (!) 179/100 (!) 180/96 (!) 177/106 (!) 164/118  Pulse: (!) 118 (!) 113 (!) 114 (!) 115  Resp: (!) 25 (!) 21 16 (!) 27  Temp: 98 F (36.7 C)     TempSrc:      SpO2: 100% 100% 100% 99%  Weight:      Height:        Examination:  GENERAL: No apparent distress.  Nontoxic. HEENT: MMM.  Vision and hearing grossly intact.  NECK: Supple.   No apparent JVD.  RESP:  No IWOB.  Fair aeration bilaterally. CVS: HR in 110s.Marland Kitchen Heart sounds normal.  ABD/GI/GU: BS+. Abd soft, NTND.  MSK/EXT:   Left hemiparesis and contracture. SKIN: no apparent skin lesion or wound NEURO: Awake and alert. Oriented 4.  Some dysarthria.  Follows commands.  Left hemiparesis and contracture. PSYCH: Calm. Normal affect.   Procedures:  None  Microbiology summarized: COVID-19, influenza and RSV PCR nonreactive Blood cultures NGTD  Assessment and plan: Acute metabolic encephalopathy/agitation: Likely due to dehydration and polypharmacy.  No clear source of infection.  CT head and ABG without acute finding.  Encephalopathy seems to have resolved.  Might have some behavioral issues. -Minimize sedating medications -Reorientation and delirium precaution -Treat treatable causes -On full liquid diet per SLP.     Dehydration, nausea and vomiting: Seems to have subsided.  CT chest, abdomen and pelvis without acute finding.  Abdominal exam benign.  She has hyperglycemia with anion gap metabolic acidosis suggesting DKA but normal pH on ABG.  Acidosis could be due to lactic acid. -Discontinue IV fluid -Continue full liquid diet -Antiemetics as needed   AKI: Likely due to the above.  Baseline Cr ~0.9-1.0.  AKI resolved. Recent Labs    10/31/22 1315 11/01/22 1609 11/04/22 0054 11/04/22 0101 11/04/22 0456 11/05/22 8119 11/06/22 0131 11/22/22 0019 11/22/22 0920 11/23/22 0739  BUN 39* 28* 30* 29* 28* 15 13 27* 23* 14  CREATININE 1.73* 1.01 1.83* 2.00* 1.68* 1.13* 0.98 1.96* 1.53* 0.89  -Discontinue IV fluid  Positive gastric Hemoccult: Likely from Mallory-Weiss tear but cannot rule out gastritis or PUD.  Slight drop in Hgb likely dilutional from IV fluid.  Overall, H&H seems to be at baseline.  Nausea and vomiting seems to have subsided. Recent Labs    11/04/22 0101 11/04/22 0456 11/05/22 2130 11/06/22 0131 11/22/22 0015 11/22/22 0019 11/22/22 0920  11/22/22 1536 11/22/22 2344 11/23/22 0739  HGB 11.6* 10.3* 9.7* 10.3* 12.6 12.6 11.1* 9.8* 10.2* 10.2*  -Continue IV Protonix for now -Continue holding Xarelto and Plavix.  -Monitor H&H -Continue full liquid diet   History of CVA with residual left hemiplegia/spasticity and dysarthria: CT head without acute finding. -Hold home Plavix due to positive gastric Hemoccults.   History of CAD: Serial troponin negative.  EKG without acute ischemic finding. -Hold home Plavix due to positive gastric Hemoccult -Resume home Ranexa and statin.   Hypotension/history of essential hypertension: Hypotension resolved.  Blood pressure elevated. -Discontinue IV fluid -Started low-dose metoprolol 25 mg twice daily -IV labetalol as needed -Discontinue antibiotics.  Infectious workup negative. -Appreciate help by pulmonology.   Sinus tachycardia: Due to dehydration and agitation.  Improved. -P.o. metoprolol as above -Optimize electrolytes.   Uncontrolled IDDM-2 with hyperglycemia: A1c 9.8% on 08/27/2022 Recent Labs  Lab 11/23/22 0002 11/23/22 0340 11/23/22 0429 11/23/22 0734 11/23/22 1307  GLUCAP 194* 172* 181* 169* 178*  -Recheck hemoglobin A1c -Continue current insulin regimen  History of diastolic CHF: Appears euvolemic overall.  No cardiopulmonary symptoms. -Continue monitoring -Discontinue IV fluid   Anxiety and depression: Stable -Resume home meds.   Lactic acidosis: Likely due to dehydration.  Infectious workup unrevealing.  Resolved. -Discontinue antibiotics.   Hypokalemia/hypomagnesemia/hypophosphatemia -Monitor replenish as appropriate  Morbid obesity: Elevated BMI with comorbidity as above. Body mass index is 35.48 kg/m.           DVT prophylaxis:  SCDs Start: 11/22/22 0523  Code Status: Full code Family Communication: None at bedside Level of care: Progressive Status is: Inpatient Remains inpatient appropriate because: Sinus tachycardia, uncontrolled blood  pressure and electrolyte derangements   Final disposition: TBD Consultants:  Pulmonology  55 minutes with more than 50% spent in reviewing records, counseling patient/family and coordinating care.   Sch Meds:  Scheduled Meds:  atorvastatin  80 mg Oral Daily   baclofen  10 mg Oral TID   budesonide  0.25 mg Nebulization BID   escitalopram  20 mg Oral q morning   ezetimibe  10 mg Oral Daily   gabapentin  100 mg Oral TID   insulin aspart  0-9 Units Subcutaneous Q4H   methimazole  5 mg Oral q morning   metoprolol tartrate  25 mg Oral BID   pantoprazole (PROTONIX) IV  40 mg Intravenous Q8H   Followed by   Melene Muller ON 11/25/2022] pantoprazole (PROTONIX) IV  40 mg Intravenous Q12H   ranolazine  500 mg Oral BID   tamsulosin  0.4 mg Oral QHS   topiramate  25 mg Oral BID   Continuous Infusions:  magnesium sulfate bolus IVPB     potassium PHOSPHATE IVPB (in mmol)     PRN Meds:.acetaminophen **OR** acetaminophen, albuterol, labetalol, LORazepam, ondansetron (ZOFRAN) IV  Antimicrobials: Anti-infectives (From admission, onward)    Start     Dose/Rate Route Frequency Ordered Stop   11/22/22 1400  linezolid (ZYVOX) IVPB 600 mg  Status:  Discontinued        600 mg 300 mL/hr over  60 Minutes Intravenous Every 12 hours 11/22/22 1338 11/23/22 0844   11/22/22 1400  ceFEPIme (MAXIPIME) 2 g in sodium chloride 0.9 % 100 mL IVPB  Status:  Discontinued        2 g 200 mL/hr over 30 Minutes Intravenous Every 12 hours 11/22/22 1343 11/23/22 0844   11/22/22 1345  ceFEPIme (MAXIPIME) 2 g in sodium chloride 0.9 % 100 mL IVPB  Status:  Discontinued        2 g 200 mL/hr over 30 Minutes Intravenous  Once 11/22/22 1335 11/22/22 1343   11/22/22 1345  linezolid (ZYVOX) IVPB 600 mg  Status:  Discontinued        600 mg 300 mL/hr over 60 Minutes Intravenous Every 12 hours 11/22/22 1338 11/22/22 1338   11/22/22 0215  cefTRIAXone (ROCEPHIN) 1 g in sodium chloride 0.9 % 100 mL IVPB        1 g 200 mL/hr over 30  Minutes Intravenous  Once 11/22/22 0208 11/22/22 0319   11/22/22 0215  azithromycin (ZITHROMAX) 500 mg in sodium chloride 0.9 % 250 mL IVPB        500 mg 250 mL/hr over 60 Minutes Intravenous  Once 11/22/22 0208 11/22/22 0505        I have personally reviewed the following labs and images: CBC: Recent Labs  Lab 11/22/22 0019 11/22/22 0920 11/22/22 1536 11/22/22 2344 11/23/22 0739  WBC 7.3 12.6*  --   --  8.3  NEUTROABS 5.4  --   --   --   --   HGB 12.6 11.1* 9.8* 10.2* 10.2*  HCT 38.6 32.7* 32.6* 30.8* 31.6*  MCV 81.6 78.4*  --   --  80.8  PLT 386 425*  --   --  365   BMP &GFR Recent Labs  Lab 11/22/22 0015 11/22/22 0019 11/22/22 0920 11/23/22 0739  NA 133* 131* 137 139  K 4.0 5.1 4.0 3.4*  CL  --  98 102 108  CO2  --  16* 20* 20*  GLUCOSE  --  366* 379* 175*  BUN  --  27* 23* 14  CREATININE  --  1.96* 1.53* 0.89  CALCIUM  --  8.8* 8.7* 8.8*  MG  --   --  1.7 1.6*  PHOS  --   --  3.1 1.7*   Estimated Creatinine Clearance: 78 mL/min (by C-G formula based on SCr of 0.89 mg/dL). Liver & Pancreas: Recent Labs  Lab 11/22/22 0019 11/22/22 0920 11/23/22 0739  AST 28  --  14*  ALT 16  --  14  ALKPHOS 78  --  58  BILITOT 1.2  --  0.5  PROT 7.3  --  6.8  ALBUMIN 3.7 3.3* 3.4*   Recent Labs  Lab 11/22/22 0019  LIPASE 25   Recent Labs  Lab 11/22/22 0920  AMMONIA 45*   Diabetic: No results for input(s): "HGBA1C" in the last 72 hours. Recent Labs  Lab 11/23/22 0002 11/23/22 0340 11/23/22 0429 11/23/22 0734 11/23/22 1307  GLUCAP 194* 172* 181* 169* 178*   Cardiac Enzymes: No results for input(s): "CKTOTAL", "CKMB", "CKMBINDEX", "TROPONINI" in the last 168 hours. No results for input(s): "PROBNP" in the last 8760 hours. Coagulation Profile: Recent Labs  Lab 11/22/22 0019  INR 1.1   Thyroid Function Tests: Recent Labs    11/22/22 2344  TSH 0.880   Lipid Profile: No results for input(s): "CHOL", "HDL", "LDLCALC", "TRIG", "CHOLHDL",  "LDLDIRECT" in the last 72 hours. Anemia Panel: No results for  input(s): "VITAMINB12", "FOLATE", "FERRITIN", "TIBC", "IRON", "RETICCTPCT" in the last 72 hours. Urine analysis:    Component Value Date/Time   COLORURINE YELLOW 11/22/2022 0019   APPEARANCEUR CLOUDY (A) 11/22/2022 0019   LABSPEC 1.009 11/22/2022 0019   PHURINE 5.0 11/22/2022 0019   GLUCOSEU >=500 (A) 11/22/2022 0019   HGBUR NEGATIVE 11/22/2022 0019   BILIRUBINUR NEGATIVE 11/22/2022 0019   KETONESUR NEGATIVE 11/22/2022 0019   PROTEINUR NEGATIVE 11/22/2022 0019   NITRITE NEGATIVE 11/22/2022 0019   LEUKOCYTESUR NEGATIVE 11/22/2022 0019   Sepsis Labs: Invalid input(s): "PROCALCITONIN", "LACTICIDVEN"  Microbiology: Recent Results (from the past 240 hour(s))  Blood Culture (routine x 2)     Status: None (Preliminary result)   Collection Time: 11/22/22 12:19 AM   Specimen: BLOOD RIGHT ARM  Result Value Ref Range Status   Specimen Description BLOOD RIGHT ARM  Final   Special Requests   Final    BOTTLES DRAWN AEROBIC AND ANAEROBIC Blood Culture adequate volume   Culture   Final    NO GROWTH 1 DAY Performed at [facility], [address], [place], [place] [address]    Report Status PENDING  Incomplete  Resp panel by RT-PCR (RSV, Flu A&B, Covid) Anterior Nasal Swab     Status: None   Collection Time: 11/22/22 12:19 AM   Specimen: Anterior Nasal Swab  Result Value Ref Range Status   SARS Coronavirus 2 by RT PCR NEGATIVE NEGATIVE Final   Influenza A by PCR NEGATIVE NEGATIVE Final   Influenza B by PCR NEGATIVE NEGATIVE Final    Comment: (NOTE) The Xpert Xpress SARS-CoV-2/FLU/RSV plus assay is intended as an aid in the diagnosis of influenza from Nasopharyngeal swab specimens and should not be used as a sole basis for treatment. Nasal washings and aspirates are unacceptable for Xpert Xpress SARS-CoV-2/FLU/RSV testing.  Fact Sheet for Patients: BloggerCourse.com  Fact Sheet for  Healthcare Providers: SeriousBroker.it  This test is not yet approved or cleared by the [place] FDA and has been authorized for detection and/or diagnosis of SARS-CoV-2 by FDA under an Emergency Use Authorization (EUA). This EUA will remain in effect (meaning this test can be used) for the duration of the COVID-19 declaration under Section 564(b)(1) of the Act, 21 U.S.C. section 360bbb-3(b)(1), unless the authorization is terminated or revoked.     Resp Syncytial Virus by PCR NEGATIVE NEGATIVE Final    Comment: (NOTE) Fact Sheet for Patients: BloggerCourse.com  Fact Sheet for Healthcare Providers: SeriousBroker.it  This test is not yet approved or cleared by the [place] FDA and has been authorized for detection and/or diagnosis of SARS-CoV-2 by FDA under an Emergency Use Authorization (EUA). This EUA will remain in effect (meaning this test can be used) for the duration of the COVID-19 declaration under Section 564(b)(1) of the Act, 21 U.S.C. section 360bbb-3(b)(1), unless the authorization is terminated or revoked.  Performed at [facility] Lab, [address], [place], [place] [address]   Blood Culture (routine x 2)     Status: None (Preliminary result)   Collection Time: 11/22/22  1:41 AM   Specimen: BLOOD RIGHT HAND  Result Value Ref Range Status   Specimen Description BLOOD RIGHT HAND  Final   Special Requests   Final    BOTTLES DRAWN AEROBIC ONLY Blood Culture adequate volume   Culture   Final    NO GROWTH 1 DAY Performed at [facility] Lab, [address], [place], [place] [address]    Report Status PENDING  Incomplete    Radiology Studies: [place] EKG SITE RITE  Result Date: 11/23/2022 If Site Rite image not attached, placement could not be confirmed due to current cardiac rhythm.     Kweku Stankey T. Ramiro Pangilinan [facility]  If 7PM-7AM, please contact  night-coverage [email or web address] 11/23/2022, 1:11 PM

## 2022-11-23 NOTE — Progress Notes (Signed)
Patient received from ED, alert and oriented X2, husband at bed side, tele box connected, HR>125, MD informed, no new orders, plan to inc. Metoprolol if remains elevated as per MD, awaiting IV team for PICC line, will continue to monitor

## 2022-11-23 NOTE — ED Notes (Addendum)
Patient vomited on herself. Patient was cleaned and all linens and gown replaced . Patient provided with and educated on use of emesis bag.

## 2022-11-23 NOTE — ED Notes (Signed)
PT at bedside.

## 2022-11-23 NOTE — ED Notes (Signed)
ED TO INPATIENT HANDOFF REPORT  ED Nurse Name and Phone #: Steward Drone [phone]  S Name/Age/Gender Bonnielee Haff Becton 52 y.o. female Room/Bed: 3W24C/3W24C-01  Code Status   Code Status: Full Code  Home/SNF/Other Home Patient oriented to: self and place Is this baseline? No   Triage Complete: Triage complete  Chief Complaint AKI (acute kidney injury) (HCC) [N17.9] Altered mental status, unspecified altered mental status type [R41.82] Acute metabolic encephalopathy [G93.41]  Triage Note Pt bib [facility] coming from home. Called by husband/.caretaker. Called initially because he thought blood sugar was low. Since 5pm pt has not been speaking. EMS reports FD states that they have been called out multiple times (three times) and not speaking is baseline. Pt vomited multiple times on way to hospital. Responds to painful stimuli. EMS states she has not spoken at all. Pt is bed bound at baseline due to hx of previous stroke. EMS states they had to suction a few times during vomiting episode as patient cannot clear airway sometimes.  EMS vital signs: 22 left wrist  4 Zofran  Cbg 364 170/100 90 HR 98% RA  22 RR    Allergies Allergies  Allergen Reactions   Asa [Aspirin] Anaphylaxis and Swelling    Throat closes   Contrast Media [Iodinated Contrast Media] Anaphylaxis and Swelling   Imitrex [Sumatriptan] Anaphylaxis   Penicillins Swelling    Mouth and eye swelling   Firvanq [Vancomycin] Other (See Comments)    Red Man's Syndrome   Cipro [Ciprofloxacin Hcl] Nausea And Vomiting   Ms Contin [Morphine] Swelling   Wound Dressing Adhesive Itching and Rash    Level of Care/Admitting Diagnosis ED Disposition     ED Disposition  Admit   Condition  --   Comment  Hospital Area: [facility] [[id number]]  Level of Care: Progressive [102]  Admit to Progressive based on following criteria: MULTISYSTEM THREATS such as stable sepsis, metabolic/electrolyte imbalance with or without  encephalopathy that is responding to early treatment.  May admit patient to [facility] or [facility] if equivalent level of care is available:: No  Covid Evaluation: Asymptomatic - no recent exposure (last 10 days) testing not required  Diagnosis: Acute metabolic encephalopathy [[id number]]  Admitting Physician: Angie Fava [[id number]]  Attending Physician: Angie Fava [[id number]]  Certification:: I certify this patient will need inpatient services for at least 2 midnights  Expected Medical Readiness: 11/24/2022          B Medical/Surgery History Past Medical History:  Diagnosis Date   CAD (coronary artery disease)    Depression    Diabetes mellitus without complication (HCC)    History of CT scan    History of mammogram    History of MRI    Hypertension    Pheochromocytoma    Stroke [facility])    Thyroid disease    Past Surgical History:  Procedure Laterality Date   ABDOMINAL HYSTERECTOMY     CESAREAN SECTION     3   CORONARY ARTERY BYPASS GRAFT     Right adrenal gland removal for pheochromocytoma Right      A IV Location/Drains/Wounds Patient Lines/Drains/Airways Status     Active Line/Drains/Airways     Name Placement date Placement time Site Days   Wound / Incision (Open or Dehisced) 11/06/22 Irritant Dermatitis (Moisture Associated Skin Damage) Groin Anterior;Right;Left  11/06/22  1017  Groin  17            Intake/Output Last 24 hours  Intake/Output Summary (Last 24  hours) at 11/23/2022 1603 Last data filed at 11/23/2022 0250 Gross per 24 hour  Intake 120 ml  Output --  Net 120 ml    Labs/Imaging Results for orders placed or performed during the hospital encounter of 11/21/22 (from the past 48 hour(s))  CBG monitoring, ED     Status: Abnormal   Collection Time: 11/21/22 11:58 PM  Result Value Ref Range   Glucose-Capillary 345 (H) 70 - 99 mg/dL    Comment: Glucose reference range applies only to samples taken after fasting for at least 8  hours.  I-Stat arterial blood gas, ED ([facility], [facility], [facility])     Status: Abnormal   Collection Time: 11/22/22 12:15 AM  Result Value Ref Range   pH, Arterial 7.359 7.35 - 7.45   pCO2 arterial 36.2 32 - 48 mmHg   pO2, Arterial 84 83 - 108 mmHg   Bicarbonate 20.7 20.0 - 28.0 mmol/L   TCO2 22 22 - 32 mmol/L   O2 Saturation 96 %   Acid-base deficit 5.0 (H) 0.0 - 2.0 mmol/L   Sodium 133 (L) 135 - 145 mmol/L   Potassium 4.0 3.5 - 5.1 mmol/L   Calcium, Ion 1.17 1.15 - 1.40 mmol/L   HCT 37.0 36.0 - 46.0 %   Hemoglobin 12.6 12.0 - 15.0 g/dL   Patient temperature 86.5 F    Collection site RADIAL, ALLEN'S TEST ACCEPTABLE    Drawn by RT    Sample type ARTERIAL   Comprehensive metabolic panel     Status: Abnormal   Collection Time: 11/22/22 12:19 AM  Result Value Ref Range   Sodium 131 (L) 135 - 145 mmol/L   Potassium 5.1 3.5 - 5.1 mmol/L    Comment: HEMOLYSIS AT THIS LEVEL MAY AFFECT RESULT   Chloride 98 98 - 111 mmol/L   CO2 16 (L) 22 - 32 mmol/L   Glucose, Bld 366 (H) 70 - 99 mg/dL    Comment: Glucose reference range applies only to samples taken after fasting for at least 8 hours.   BUN 27 (H) 6 - 20 mg/dL   Creatinine, Ser 7.84 (H) 0.44 - 1.00 mg/dL   Calcium 8.8 (L) 8.9 - 10.3 mg/dL   Total Protein 7.3 6.5 - 8.1 g/dL   Albumin 3.7 3.5 - 5.0 g/dL   AST 28 15 - 41 U/L    Comment: HEMOLYSIS AT THIS LEVEL MAY AFFECT RESULT   ALT 16 0 - 44 U/L    Comment: HEMOLYSIS AT THIS LEVEL MAY AFFECT RESULT   Alkaline Phosphatase 78 38 - 126 U/L   Total Bilirubin 1.2 0.3 - 1.2 mg/dL    Comment: HEMOLYSIS AT THIS LEVEL MAY AFFECT RESULT   GFR, Estimated 31 (L) >60 mL/min    Comment: (NOTE) Calculated using the CKD-EPI Creatinine Equation (2021)    Anion gap 17 (H) 5 - 15    Comment: Performed at [facility], [address] [place], [place] [address]  CBC with Differential     Status: Abnormal   Collection Time: 11/22/22 12:19 AM  Result Value Ref Range   WBC 7.3 4.0 - 10.5 K/uL   RBC  4.73 3.87 - 5.11 MIL/uL   Hemoglobin 12.6 12.0 - 15.0 g/dL   HCT 52.8 41.3 - 24.4 %   MCV 81.6 80.0 - 100.0 fL   MCH 26.6 26.0 - 34.0 pg   MCHC 32.6 30.0 - 36.0 g/dL   RDW 01.0 (H) 27.2 - 53.6 %   Platelets 386  150 - 400 K/uL    Comment: REPEATED TO VERIFY   nRBC 0.0 0.0 - 0.2 %   Neutrophils Relative % 73 %   Neutro Abs 5.4 1.7 - 7.7 K/uL   Lymphocytes Relative 19 %   Lymphs Abs 1.4 0.7 - 4.0 K/uL   Monocytes Relative 6 %   Monocytes Absolute 0.4 0.1 - 1.0 K/uL   Eosinophils Relative 1 %   Eosinophils Absolute 0.1 0.0 - 0.5 K/uL   Basophils Relative 1 %   Basophils Absolute 0.0 0.0 - 0.1 K/uL   Immature Granulocytes 0 %   Abs Immature Granulocytes 0.03 0.00 - 0.07 K/uL    Comment: Performed at [facility], [address], [place], [place] [address]  Protime-INR     Status: None   Collection Time: 11/22/22 12:19 AM  Result Value Ref Range   Prothrombin Time 13.9 11.4 - 15.2 seconds   INR 1.1 0.8 - 1.2    Comment: (NOTE) INR goal varies based on device and disease states. Performed at [facility], [address] [place], [place] [address]   APTT     Status: Abnormal   Collection Time: 11/22/22 12:19 AM  Result Value Ref Range   aPTT 21 (L) 24 - 36 seconds    Comment: Performed at [facility], [address], [place], [place] [address]  Blood Culture (routine x 2)     Status: None (Preliminary result)   Collection Time: 11/22/22 12:19 AM   Specimen: BLOOD RIGHT ARM  Result Value Ref Range   Specimen Description BLOOD RIGHT ARM    Special Requests      BOTTLES DRAWN AEROBIC AND ANAEROBIC Blood Culture adequate volume   Culture      NO GROWTH 1 DAY Performed at [facility], [address], [place], [place] [address]    Report Status PENDING   Urinalysis, w/ Reflex to Culture (Infection Suspected) -Urine, Catheterized     Status: Abnormal   Collection Time: 11/22/22 12:19 AM  Result Value Ref Range   Specimen Source URINE, CATHETERIZED     Color, Urine YELLOW YELLOW   APPearance CLOUDY (A) CLEAR   Specific Gravity, Urine 1.009 1.005 - 1.030   pH 5.0 5.0 - 8.0   Glucose, UA >=500 (A) NEGATIVE mg/dL   Hgb urine dipstick NEGATIVE NEGATIVE   Bilirubin Urine NEGATIVE NEGATIVE   Ketones, ur NEGATIVE NEGATIVE mg/dL   Protein, ur NEGATIVE NEGATIVE mg/dL   Nitrite NEGATIVE NEGATIVE   Leukocytes,Ua NEGATIVE NEGATIVE   RBC / HPF 0-5 0 - 5 RBC/hpf   WBC, UA 0-5 0 - 5 WBC/hpf    Comment:        Reflex urine culture not performed if WBC <=10, OR if Squamous epithelial cells >5. If Squamous epithelial cells >5 suggest recollection.    Bacteria, UA RARE (A) NONE SEEN   Squamous Epithelial / HPF 21-50 0 - 5 /HPF   Mucus PRESENT    Hyaline Casts, UA PRESENT     Comment: Performed at [facility], [address], [place], [place] [address]  Lipase, blood     Status: None   Collection Time: 11/22/22 12:19 AM  Result Value Ref Range   Lipase 25 11 - 51 U/L    Comment: Performed at [facility], [address] [place], [place] [address]  Troponin I (High Sensitivity)     Status: None   Collection Time: 11/22/22 12:19  AM  Result Value Ref Range   Troponin I (High Sensitivity) 7 <18 ng/L    Comment: (NOTE) Elevated high sensitivity troponin I (hsTnI) values and significant  changes across serial measurements may suggest ACS but many other  chronic and acute conditions are known to elevate hsTnI results.  Refer to the "Links" section for chest pain algorithms and additional  guidance. Performed at [facility], [address], [place], [place] [address]   Resp panel by RT-PCR (RSV, Flu A&B, Covid) Anterior Nasal Swab     Status: None   Collection Time: 11/22/22 12:19 AM   Specimen: Anterior Nasal Swab  Result Value Ref Range   SARS Coronavirus 2 by RT PCR NEGATIVE NEGATIVE   Influenza A by PCR NEGATIVE NEGATIVE   Influenza B by PCR NEGATIVE NEGATIVE    Comment: (NOTE) The Xpert Xpress SARS-CoV-2/FLU/RSV plus  assay is intended as an aid in the diagnosis of influenza from Nasopharyngeal swab specimens and should not be used as a sole basis for treatment. Nasal washings and aspirates are unacceptable for Xpert Xpress SARS-CoV-2/FLU/RSV testing.  Fact Sheet for Patients: BloggerCourse.com  Fact Sheet for Healthcare Providers: SeriousBroker.it  This test is not yet approved or cleared by the [place] FDA and has been authorized for detection and/or diagnosis of SARS-CoV-2 by FDA under an Emergency Use Authorization (EUA). This EUA will remain in effect (meaning this test can be used) for the duration of the COVID-19 declaration under Section 564(b)(1) of the Act, 21 U.S.C. section 360bbb-3(b)(1), unless the authorization is terminated or revoked.     Resp Syncytial Virus by PCR NEGATIVE NEGATIVE    Comment: (NOTE) Fact Sheet for Patients: BloggerCourse.com  Fact Sheet for Healthcare Providers: SeriousBroker.it  This test is not yet approved or cleared by the [place] FDA and has been authorized for detection and/or diagnosis of SARS-CoV-2 by FDA under an Emergency Use Authorization (EUA). This EUA will remain in effect (meaning this test can be used) for the duration of the COVID-19 declaration under Section 564(b)(1) of the Act, 21 U.S.C. section 360bbb-3(b)(1), unless the authorization is terminated or revoked.  Performed at [facility], [address], [place], [place] [address]   Rapid urine drug screen (hospital performed)     Status: None   Collection Time: 11/22/22 12:19 AM  Result Value Ref Range   Opiates NONE DETECTED NONE DETECTED   Cocaine NONE DETECTED NONE DETECTED   Benzodiazepines NONE DETECTED NONE DETECTED   Amphetamines NONE DETECTED NONE DETECTED   Tetrahydrocannabinol NONE DETECTED NONE DETECTED   Barbiturates NONE DETECTED NONE DETECTED     Comment: (NOTE) DRUG SCREEN FOR MEDICAL PURPOSES ONLY.  IF CONFIRMATION IS NEEDED FOR ANY PURPOSE, NOTIFY LAB WITHIN 5 DAYS.  LOWEST DETECTABLE LIMITS FOR URINE DRUG SCREEN Drug Class                     Cutoff (ng/mL) Amphetamine and metabolites    1000 Barbiturate and metabolites    200 Benzodiazepine                 200 Opiates and metabolites        300 Cocaine and metabolites        300 THC                            50 Performed at [facility], [address], [place], [place]  [address]   I-Stat Lactic Acid, ED     Status: Abnormal   Collection Time: 11/22/22 12:53 AM  Result Value Ref Range   Lactic Acid, Venous 2.5 (HH) 0.5 - 1.9 mmol/L   Comment NOTIFIED PHYSICIAN   Blood Culture (routine x 2)     Status: None (Preliminary result)   Collection Time: 11/22/22  1:41 AM   Specimen: BLOOD RIGHT HAND  Result Value Ref Range   Specimen Description BLOOD RIGHT HAND    Special Requests      BOTTLES DRAWN AEROBIC ONLY Blood Culture adequate volume   Culture      NO GROWTH 1 DAY Performed at [facility], [address], [place], [place] [address]    Report Status PENDING   Troponin I (High Sensitivity)     Status: None   Collection Time: 11/22/22  3:46 AM  Result Value Ref Range   Troponin I (High Sensitivity) 4 <18 ng/L    Comment: (NOTE) Elevated high sensitivity troponin I (hsTnI) values and significant  changes across serial measurements may suggest ACS but many other  chronic and acute conditions are known to elevate hsTnI results.  Refer to the "Links" section for chest pain algorithms and additional  guidance. Performed at [facility], [address] [place], [place] [address]   I-Stat Lactic Acid, ED     Status: Abnormal   Collection Time: 11/22/22  3:52 AM  Result Value Ref Range   Lactic Acid, Venous 3.5 (HH) 0.5 - 1.9 mmol/L   Comment NOTIFIED PHYSICIAN   Occult bld gastric/duodenum (cup to lab)     Status: Abnormal   Collection  Time: 11/22/22  4:15 AM  Result Value Ref Range   pH, Gastric NOT DONE    Occult Blood, Gastric POSITIVE (A) NEGATIVE    Comment: Performed at [facility], [address] [place], [place] [address]  CBG monitoring, ED     Status: Abnormal   Collection Time: 11/22/22  9:01 AM  Result Value Ref Range   Glucose-Capillary 357 (H) 70 - 99 mg/dL    Comment: Glucose reference range applies only to samples taken after fasting for at least 8 hours.  Renal function panel     Status: Abnormal   Collection Time: 11/22/22  9:20 AM  Result Value Ref Range   Sodium 137 135 - 145 mmol/L   Potassium 4.0 3.5 - 5.1 mmol/L   Chloride 102 98 - 111 mmol/L   CO2 20 (L) 22 - 32 mmol/L   Glucose, Bld 379 (H) 70 - 99 mg/dL    Comment: Glucose reference range applies only to samples taken after fasting for at least 8 hours.   BUN 23 (H) 6 - 20 mg/dL   Creatinine, Ser 4.03 (H) 0.44 - 1.00 mg/dL   Calcium 8.7 (L) 8.9 - 10.3 mg/dL   Phosphorus 3.1 2.5 - 4.6 mg/dL   Albumin 3.3 (L) 3.5 - 5.0 g/dL   GFR, Estimated 41 (L) >60 mL/min    Comment: (NOTE) Calculated using the CKD-EPI Creatinine Equation (2021)    Anion gap 15 5 - 15    Comment: Performed at [facility], [address] [place], [place] [address]  CBC     Status: Abnormal   Collection Time: 11/22/22  9:20 AM  Result Value Ref Range   WBC 12.6 (H) 4.0 - 10.5 K/uL   RBC 4.17 3.87 - 5.11 MIL/uL   Hemoglobin 11.1 (L) 12.0 -  15.0 g/dL   HCT 16.1 (L) 09.6 - 04.5 %   MCV 78.4 (L) 80.0 - 100.0 fL   MCH 26.6 26.0 - 34.0 pg   MCHC 33.9 30.0 - 36.0 g/dL   RDW 40.9 (H) 81.1 - 91.4 %   Platelets 425 (H) 150 - 400 K/uL   nRBC 0.0 0.0 - 0.2 %    Comment: Performed at [facility], [address] [place], [place] [address]  Lactic acid, plasma     Status: Abnormal   Collection Time: 11/22/22  9:20 AM  Result Value Ref Range   Lactic Acid, Venous 2.4 (HH) 0.5 - 1.9 mmol/L    Comment: CRITICAL RESULT CALLED TO, READ BACK BY AND  VERIFIED WITH Franklyn Lor, RN 1102 11/22/2022 SANDOVAL K Performed at [facility], [address], [place], [place] [address]   Ammonia     Status: Abnormal   Collection Time: 11/22/22  9:20 AM  Result Value Ref Range   Ammonia 45 (H) 9 - 35 umol/L    Comment: Performed at [facility], [address] [place], [place] [address]  Magnesium     Status: None   Collection Time: 11/22/22  9:20 AM  Result Value Ref Range   Magnesium 1.7 1.7 - 2.4 mg/dL    Comment: Performed at [facility], [address], [place], [place] [address]  CBG monitoring, ED     Status: Abnormal   Collection Time: 11/22/22 12:07 PM  Result Value Ref Range   Glucose-Capillary 276 (H) 70 - 99 mg/dL    Comment: Glucose reference range applies only to samples taken after fasting for at least 8 hours.  Hemoglobin and hematocrit, blood     Status: Abnormal   Collection Time: 11/22/22  3:36 PM  Result Value Ref Range   Hemoglobin 9.8 (L) 12.0 - 15.0 g/dL   HCT 95.2 (L) 84.1 - 32.4 %    Comment: Performed at [facility], [address] [place], [place] [address]  CBG monitoring, ED     Status: Abnormal   Collection Time: 11/22/22  3:45 PM  Result Value Ref Range   Glucose-Capillary 198 (H) 70 - 99 mg/dL    Comment: Glucose reference range applies only to samples taken after fasting for at least 8 hours.  CBG monitoring, ED     Status: Abnormal   Collection Time: 11/22/22  7:53 PM  Result Value Ref Range   Glucose-Capillary 143 (H) 70 - 99 mg/dL    Comment: Glucose reference range applies only to samples taken after fasting for at least 8 hours.  Beta-hydroxybutyric acid     Status: Abnormal   Collection Time: 11/22/22 11:44 PM  Result Value Ref Range   Beta-Hydroxybutyric Acid 0.77 (H) 0.05 - 0.27 mmol/L    Comment: Performed at [facility], [address] [place], [place] [address]  TSH     Status: None   Collection Time: 11/22/22 11:44 PM  Result Value Ref Range   TSH 0.880  0.350 - 4.500 uIU/mL    Comment: Performed by a 3rd Generation assay with a functional sensitivity of <=0.01 uIU/mL. Performed at [facility], [address] [place], [place] [address]   Hemoglobin and hematocrit, blood     Status: Abnormal   Collection Time: 11/22/22 11:44 PM  Result Value Ref Range   Hemoglobin 10.2 (L) 12.0 - 15.0 g/dL   HCT 47.4 (L) 25.9 -  46.0 %    Comment: Performed at [facility], [address], [place], [place] [address]  Troponin I (High Sensitivity)     Status: None   Collection Time: 11/22/22 11:44 PM  Result Value Ref Range   Troponin I (High Sensitivity) 6 <18 ng/L    Comment: (NOTE) Elevated high sensitivity troponin I (hsTnI) values and significant  changes across serial measurements may suggest ACS but many other  chronic and acute conditions are known to elevate hsTnI results.  Refer to the "Links" section for chest pain algorithms and additional  guidance. Performed at [facility], [address] [place], [place] [address]   CBG monitoring, ED     Status: Abnormal   Collection Time: 11/23/22 12:02 AM  Result Value Ref Range   Glucose-Capillary 194 (H) 70 - 99 mg/dL    Comment: Glucose reference range applies only to samples taken after fasting for at least 8 hours.  Lactic acid, plasma     Status: None   Collection Time: 11/23/22  1:47 AM  Result Value Ref Range   Lactic Acid, Venous 1.1 0.5 - 1.9 mmol/L    Comment: Performed at [facility], [address], [place], [place] [address]  Troponin I (High Sensitivity)     Status: None   Collection Time: 11/23/22  1:47 AM  Result Value Ref Range   Troponin I (High Sensitivity) 6 <18 ng/L    Comment: (NOTE) Elevated high sensitivity troponin I (hsTnI) values and significant  changes across serial measurements may suggest ACS but many other  chronic and acute conditions are known to elevate hsTnI results.  Refer to the "Links" section for chest pain algorithms and  additional  guidance. Performed at [facility], [address], [place], [place] [address]   CBG monitoring, ED     Status: Abnormal   Collection Time: 11/23/22  3:40 AM  Result Value Ref Range   Glucose-Capillary 172 (H) 70 - 99 mg/dL    Comment: Glucose reference range applies only to samples taken after fasting for at least 8 hours.  CBG monitoring, ED     Status: Abnormal   Collection Time: 11/23/22  4:29 AM  Result Value Ref Range   Glucose-Capillary 181 (H) 70 - 99 mg/dL    Comment: Glucose reference range applies only to samples taken after fasting for at least 8 hours.  CBG monitoring, ED     Status: Abnormal   Collection Time: 11/23/22  7:34 AM  Result Value Ref Range   Glucose-Capillary 169 (H) 70 - 99 mg/dL    Comment: Glucose reference range applies only to samples taken after fasting for at least 8 hours.  CBC     Status: Abnormal   Collection Time: 11/23/22  7:39 AM  Result Value Ref Range   WBC 8.3 4.0 - 10.5 K/uL   RBC 3.91 3.87 - 5.11 MIL/uL   Hemoglobin 10.2 (L) 12.0 - 15.0 g/dL   HCT 29.5 (L) 62.1 - 30.8 %   MCV 80.8 80.0 - 100.0 fL   MCH 26.1 26.0 - 34.0 pg   MCHC 32.3 30.0 - 36.0 g/dL   RDW 65.7 (H) 84.6 - 96.2 %   Platelets 365 150 - 400 K/uL   nRBC 0.0 0.0 - 0.2 %    Comment: Performed at [facility] Lab, [address], [place], [place] [address]  Comprehensive metabolic panel     Status: Abnormal   Collection  Time: 11/23/22  7:39 AM  Result Value Ref Range   Sodium 139 135 - 145 mmol/L   Potassium 3.4 (L) 3.5 - 5.1 mmol/L   Chloride 108 98 - 111 mmol/L   CO2 20 (L) 22 - 32 mmol/L   Glucose, Bld 175 (H) 70 - 99 mg/dL    Comment: Glucose reference range applies only to samples taken after fasting for at least 8 hours.   BUN 14 6 - 20 mg/dL   Creatinine, Ser 1.61 0.44 - 1.00 mg/dL   Calcium 8.8 (L) 8.9 - 10.3 mg/dL   Total Protein 6.8 6.5 - 8.1 g/dL   Albumin 3.4 (L) 3.5 - 5.0 g/dL   AST 14 (L) 15 - 41 U/L   ALT 14 0 - 44 U/L    Alkaline Phosphatase 58 38 - 126 U/L   Total Bilirubin 0.5 0.3 - 1.2 mg/dL   GFR, Estimated >09 >60 mL/min    Comment: (NOTE) Calculated using the CKD-EPI Creatinine Equation (2021)    Anion gap 11 5 - 15    Comment: Performed at [facility], [address] [place], [place] [address]  Magnesium     Status: Abnormal   Collection Time: 11/23/22  7:39 AM  Result Value Ref Range   Magnesium 1.6 (L) 1.7 - 2.4 mg/dL    Comment: Performed at [facility], [address] [place], [place] [address]  Phosphorus     Status: Abnormal   Collection Time: 11/23/22  7:39 AM  Result Value Ref Range   Phosphorus 1.7 (L) 2.5 - 4.6 mg/dL    Comment: Performed at [facility], [address], [place], [place] [address]  CBG monitoring, ED     Status: Abnormal   Collection Time: 11/23/22  1:07 PM  Result Value Ref Range   Glucose-Capillary 178 (H) 70 - 99 mg/dL    Comment: Glucose reference range applies only to samples taken after fasting for at least 8 hours.  CBG monitoring, ED     Status: Abnormal   Collection Time: 11/23/22  3:31 PM  Result Value Ref Range   Glucose-Capillary 177 (H) 70 - 99 mg/dL    Comment: Glucose reference range applies only to samples taken after fasting for at least 8 hours.   [place] EKG SITE RITE  Result Date: 11/23/2022 If Site Rite image not attached, placement could not be confirmed due to current cardiac rhythm.  CT CHEST ABDOMEN PELVIS WO CONTRAST  Result Date: 11/22/2022 CLINICAL DATA:  Sepsis and mental status change EXAM: CT CHEST, ABDOMEN AND PELVIS WITHOUT CONTRAST TECHNIQUE: Multidetector CT imaging of the chest, abdomen and pelvis was performed following the standard protocol without IV contrast. RADIATION DOSE REDUCTION: This exam was performed according to the departmental dose-optimization program which includes automated exposure control, adjustment of the mA and/or kV according to patient size and/or use of iterative reconstruction  technique. COMPARISON:  Abdominal CT 10/31/2022 FINDINGS: CT CHEST FINDINGS Cardiovascular: No acute vascular findings. Normal heart size. No pericardial effusion. Extensive atheromatous calcification of the aorta and coronaries. Prior CABG. Aberrant right subclavian artery. Mediastinum/Nodes: No mass or adenopathy. Enteric tube which traverses the esophagus. Small sliding hiatal hernia. Lungs/Pleura: Band of opacity in the right lower lobe is stable from prior and attributed to scar. Low volume lungs, mild hazy densities likely atelectasis with motion. There is no edema, consolidation, effusion, or pneumothorax. Musculoskeletal: No acute finding CT ABDOMEN PELVIS FINDINGS Lower chest:  No contributory findings. Hepatobiliary:  No focal liver abnormality.No evidence of biliary obstruction or stone. Pancreas: Calcifications are likely vascular.  No acute finding. Spleen: Unremarkable. Adrenals/Urinary Tract: Negative adrenals. No hydronephrosis or stone. Unremarkable bladder. Stomach/Bowel: No obstruction. No appendicitis. Duodenal diverticulum, uncomplicated. Vascular/Lymphatic: No acute vascular abnormality. Extensive atheromatous calcification of the aorta and iliacs. No mass or adenopathy. Reproductive:No pathologic findings. Other: No ascites or pneumoperitoneum. Spiculated density in the lower midline abdominal wall remain stable consistent with scarring. Musculoskeletal: No acute abnormalities. IMPRESSION: 1. No acute finding or specific cause for history of sepsis. 2. Advanced atherosclerosis. Electronically Signed   By: Tiburcio Pea M.D.   On: 11/22/2022 04:03   CT HEAD WO CONTRAST ( )  Result Date: 11/22/2022 CLINICAL DATA:  Mental status change, sepsis EXAM: CT HEAD WITHOUT CONTRAST TECHNIQUE: Contiguous axial images were obtained from the base of the skull through the vertex without intravenous contrast. RADIATION DOSE REDUCTION: This exam was performed according to the departmental  dose-optimization program which includes automated exposure control, adjustment of the mA and/or kV according to patient size and/or use of iterative reconstruction technique. COMPARISON:  11/03/2022 FINDINGS: Brain: No evidence of acute infarction, hemorrhage, mass, mass effect, or midline shift. No hydrocephalus or extra-axial fluid collection. 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 Additional remote lacunar infarcts in the bilateral basal ganglia and thalami and in the right cerebellum. Periventricular white matter changes, likely the sequela of chronic small vessel ischemic disease. Vascular: No hyperdense vessel. Atherosclerotic calcifications in the intracranial carotid and vertebral arteries. Skull: Negative for fracture or focal lesion. Sinuses/Orbits: No acute finding. Other: The mastoid air cells are well aerated.  Nasogastric tube. IMPRESSION: No acute intracranial process. Electronically Signed   By: Wiliam Ke M.D.   On: 11/22/2022 03:53   DG Abdomen 1 View  Result Date: 11/22/2022 CLINICAL DATA:  NG tube placement EXAM: ABDOMEN - 1 VIEW COMPARISON:  10/31/2022 FINDINGS: Limited field of view for tube placement verification. An enteric tube has been placed with tip projecting over the upper abdomen at the midline consistent with location in the body of the stomach. Visualized bowel gas pattern is normal without gaseous distention of bowel demonstrated. Lung bases are clear. Postoperative changes in the mediastinum. IMPRESSION: Enteric tube tip projects over the upper abdomen consistent with location in the body of the stomach. Electronically Signed   By: Burman Nieves M.D.   On: 11/22/2022 03:31   DG Chest Port 1 View  Result Date: 11/21/2022 CLINICAL DATA:  Question of sepsis to evaluate for abnormality. Low blood sugar. EXAM: PORTABLE CHEST 1 VIEW COMPARISON:  11/04/2022 FINDINGS: Postoperative changes in the mediastinum. Shallow inspiration. Heart size and  pulmonary vascularity are normal for technique. Developing linear atelectasis or infiltration in the mid lungs. No pleural effusions. No pneumothorax. 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 Calcification of the aorta. IMPRESSION: Shallow inspiration with developing linear atelectasis or infiltration in the mid lungs. Electronically Signed   By: Burman Nieves M.D.   On: 11/21/2022 23:25    Pending Labs Unresulted Labs (From admission, onward)     Start     Ordered   11/22/22 1526  Hemoglobin and hematocrit, blood  Now then every 8 hours,   R      11/22/22 1525            Vitals/Pain Today's Vitals   11/23/22 1245 11/23/22 1300 11/23/22 1330 11/23/22 1400  BP:  (!) 164/118  (!) 168/104  Pulse: (!) 106 (!) 115 (!) 109   Resp: Marland Kitchen)  9 (!) 27 20 17   Temp:  98 F (36.7 C)    TempSrc:      SpO2: 100% 99% 100%   Weight:      Height:        Isolation Precautions No active isolations  Medications Medications  pantoprazole (PROTONIX) injection 40 mg (40 mg Intravenous Given 11/22/22 2206)    Followed by  pantoprazole (PROTONIX) injection 40 mg (has no administration in time range)  acetaminophen (TYLENOL) tablet 650 mg (has no administration in time range)    Or  acetaminophen (TYLENOL) suppository 650 mg (has no administration in time range)  ondansetron (ZOFRAN) injection 4 mg (4 mg Intravenous Given 11/22/22 1300)  LORazepam (ATIVAN) injection 0.5 mg (0.5 mg Intravenous Given 11/22/22 1516)  insulin aspart (novoLOG) injection 0-9 Units ( Subcutaneous Not Given 11/23/22 1538)  magnesium sulfate IVPB 2 g 50 mL (has no administration in time range)  potassium PHOSPHATE 30 mmol in dextrose 5 % 500 mL infusion (has no administration in time range)  atorvastatin (LIPITOR) tablet 80 mg (80 mg Oral Given 11/23/22 1546)  ezetimibe (ZETIA) tablet 10 mg (has no administration in time range)  ranolazine (RANEXA) 12 hr tablet 500 mg (has no administration in time range)  escitalopram (LEXAPRO) tablet  20 mg (20 mg Oral Given 11/23/22 1545)  methimazole (TAPAZOLE) tablet 5 mg (5 mg Oral Given 11/23/22 1547)  tamsulosin (FLOMAX) capsule 0.4 mg (has no administration in time range)  baclofen (LIORESAL) tablet 10 mg (10 mg Oral Not Given 11/23/22 1554)  gabapentin (NEURONTIN) capsule 100 mg (100 mg Oral Not Given 11/23/22 1554)  topiramate (TOPAMAX) tablet 25 mg (25 mg Oral Given 11/23/22 1546)  albuterol (PROVENTIL) (2.5 MG/3ML) 0.083% nebulizer solution 2.5 mg (has no administration in time range)  budesonide (PULMICORT) nebulizer solution 0.25 mg (has no administration in time range)  metoprolol tartrate (LOPRESSOR) tablet 25 mg (25 mg Oral Given 11/23/22 1546)  labetalol (NORMODYNE) injection 10 mg (has no administration in time range)  cefTRIAXone (ROCEPHIN) 1 g in sodium chloride 0.9 % 100 mL IVPB (0 g Intravenous Stopped 11/22/22 0319)  azithromycin (ZITHROMAX) 500 mg in sodium chloride 0.9 % 250 mL IVPB (0 mg Intravenous Stopped 11/22/22 0505)  metoCLOPramide (REGLAN) injection 10 mg (10 mg Intravenous Given 11/22/22 0243)  sodium chloride 0.9 % bolus 1,000 mL (1,000 mLs Intravenous Bolus 11/22/22 0255)  pantoprazole (PROTONIX) injection 40 mg (40 mg Intravenous Given 11/22/22 0624)  sodium chloride 0.9 % bolus 1,000 mL (0 mLs Intravenous Stopped 11/22/22 1515)    Mobility manual wheelchair     Focused Assessments Neuro Assessment Handoff:  Swallow screen pass?    Cardiac Rhythm: Sinus tachycardia       Neuro Assessment: Exceptions to [facility] Neuro Checks:      Has TPA been given? No If patient is a Neuro Trauma and patient is going to OR before floor call report to 4N Charge nurse: [phone] or [phone]   R Recommendations: See Admitting Provider Note  Report given to:   Additional Notes: [name] at bedside.  Consent signed for PICC to be placed.  MD aware that IV meds and fluids have not been given d/t loss of line.  Multiple attempts to reach husband  throughout day delayed picc placement.    B

## 2022-11-23 NOTE — Progress Notes (Addendum)
Unable to contact spouse for PICC consent.  Notified RN via secure chat. States no family in room at this time.  Will attempt again later.

## 2022-11-23 NOTE — ED Notes (Signed)
ED TO INPATIENT HANDOFF REPORT  ED Nurse Name and Phone #: Steward Drone [phone]  S Name/Age/Gender Lori [name] 52 y.o. female Room/Bed: 3W24C/3W24C-01  Code Status   Code Status: Full Code  Home/SNF/Other Home Patient oriented to: self and place Is this baseline? No   Triage Complete: Triage complete  Chief Complaint AKI (acute kidney injury) (HCC) [N17.9] Altered mental status, unspecified altered mental status type [R41.82] Acute metabolic encephalopathy [G93.41]  Triage Note Pt bib [facility] coming from home. Called by husband/.caretaker. Called initially because he thought blood sugar was low. Since 5pm pt has not been speaking. EMS reports FD states that they have been called out multiple times (three times) and not speaking is baseline. Pt vomited multiple times on way to hospital. Responds to painful stimuli. EMS states she has not spoken at all. Pt is bed bound at baseline due to hx of previous stroke. EMS states they had to suction a few times during vomiting episode as patient cannot clear airway sometimes.  EMS vital signs: 22 left wrist  4 Zofran  Cbg 364 170/100 90 HR 98% RA  22 RR    Allergies Allergies  Allergen Reactions   Asa [Aspirin] Anaphylaxis and Swelling    Throat closes   Contrast Media [Iodinated Contrast Media] Anaphylaxis and Swelling   Imitrex [Sumatriptan] Anaphylaxis   Penicillins Swelling    Mouth and eye swelling   Firvanq [Vancomycin] Other (See Comments)    Red Man's Syndrome   Cipro [Ciprofloxacin Hcl] Nausea And Vomiting   Ms Contin [Morphine] Swelling   Wound Dressing Adhesive Itching and Rash    Level of Care/Admitting Diagnosis ED Disposition     ED Disposition  Admit   Condition  --   Comment  Hospital Area: [facility] [[id number]]  Level of Care: Progressive [102]  Admit to Progressive based on following criteria: MULTISYSTEM THREATS such as stable sepsis, metabolic/electrolyte imbalance with or without  encephalopathy that is responding to early treatment.  May admit patient to [facility] or [facility] if equivalent level of care is available:: No  Covid Evaluation: Asymptomatic - no recent exposure (last 10 days) testing not required  Diagnosis: Acute metabolic encephalopathy [[id number]]  Admitting Physician: Angie Fava [[id number]]  Attending Physician: Angie Fava [[id number]]  Certification:: I certify this patient will need inpatient services for at least 2 midnights  Expected Medical Readiness: 11/24/2022          B Medical/Surgery History Past Medical History:  Diagnosis Date   CAD (coronary artery disease)    Depression    Diabetes mellitus without complication (HCC)    History of CT scan    History of mammogram    History of MRI    Hypertension    Pheochromocytoma    Stroke [facility])    Thyroid disease    Past Surgical History:  Procedure Laterality Date   ABDOMINAL HYSTERECTOMY     CESAREAN SECTION     3   CORONARY ARTERY BYPASS GRAFT     Right adrenal gland removal for pheochromocytoma Right      A IV Location/Drains/Wounds Patient Lines/Drains/Airways Status     Active Line/Drains/Airways     Name Placement date Placement time Site Days   Wound / Incision (Open or Dehisced) 11/06/22 Irritant Dermatitis (Moisture Associated Skin Damage) Groin Anterior;Right;Left  11/06/22  1017  Groin  17            Intake/Output Last 24 hours  Intake/Output Summary (Last 24  hours) at 11/23/2022 1605 Last data filed at 11/23/2022 0250 Gross per 24 hour  Intake 120 ml  Output --  Net 120 ml    Labs/Imaging Results for orders placed or performed during the hospital encounter of 11/21/22 (from the past 48 hour(s))  CBG monitoring, ED     Status: Abnormal   Collection Time: 11/21/22 11:58 PM  Result Value Ref Range   Glucose-Capillary 345 (H) 70 - 99 mg/dL    Comment: Glucose reference range applies only to samples taken after fasting for at least 8  hours.  I-Stat arterial blood gas, ED ([facility], [facility], [facility])     Status: Abnormal   Collection Time: 11/22/22 12:15 AM  Result Value Ref Range   pH, Arterial 7.359 7.35 - 7.45   pCO2 arterial 36.2 32 - 48 mmHg   pO2, Arterial 84 83 - 108 mmHg   Bicarbonate 20.7 20.0 - 28.0 mmol/L   TCO2 22 22 - 32 mmol/L   O2 Saturation 96 %   Acid-base deficit 5.0 (H) 0.0 - 2.0 mmol/L   Sodium 133 (L) 135 - 145 mmol/L   Potassium 4.0 3.5 - 5.1 mmol/L   Calcium, Ion 1.17 1.15 - 1.40 mmol/L   HCT 37.0 36.0 - 46.0 %   Hemoglobin 12.6 12.0 - 15.0 g/dL   Patient temperature 29.5 F    Collection site RADIAL, ALLEN'S TEST ACCEPTABLE    Drawn by RT    Sample type ARTERIAL   Comprehensive metabolic panel     Status: Abnormal   Collection Time: 11/22/22 12:19 AM  Result Value Ref Range   Sodium 131 (L) 135 - 145 mmol/L   Potassium 5.1 3.5 - 5.1 mmol/L    Comment: HEMOLYSIS AT THIS LEVEL MAY AFFECT RESULT   Chloride 98 98 - 111 mmol/L   CO2 16 (L) 22 - 32 mmol/L   Glucose, Bld 366 (H) 70 - 99 mg/dL    Comment: Glucose reference range applies only to samples taken after fasting for at least 8 hours.   BUN 27 (H) 6 - 20 mg/dL   Creatinine, Ser 6.21 (H) 0.44 - 1.00 mg/dL   Calcium 8.8 (L) 8.9 - 10.3 mg/dL   Total Protein 7.3 6.5 - 8.1 g/dL   Albumin 3.7 3.5 - 5.0 g/dL   AST 28 15 - 41 U/L    Comment: HEMOLYSIS AT THIS LEVEL MAY AFFECT RESULT   ALT 16 0 - 44 U/L    Comment: HEMOLYSIS AT THIS LEVEL MAY AFFECT RESULT   Alkaline Phosphatase 78 38 - 126 U/L   Total Bilirubin 1.2 0.3 - 1.2 mg/dL    Comment: HEMOLYSIS AT THIS LEVEL MAY AFFECT RESULT   GFR, Estimated 31 (L) >60 mL/min    Comment: (NOTE) Calculated using the CKD-EPI Creatinine Equation (2021)    Anion gap 17 (H) 5 - 15    Comment: Performed at [facility], [address] [place], [place] [address]  CBC with Differential     Status: Abnormal   Collection Time: 11/22/22 12:19 AM  Result Value Ref Range   WBC 7.3 4.0 - 10.5 K/uL   RBC  4.73 3.87 - 5.11 MIL/uL   Hemoglobin 12.6 12.0 - 15.0 g/dL   HCT 78.4 69.6 - 29.5 %   MCV 81.6 80.0 - 100.0 fL   MCH 26.6 26.0 - 34.0 pg   MCHC 32.6 30.0 - 36.0 g/dL   RDW 28.4 (H) 13.2 - 44.0 %   Platelets 386  150 - 400 K/uL    Comment: REPEATED TO VERIFY   nRBC 0.0 0.0 - 0.2 %   Neutrophils Relative % 73 %   Neutro Abs 5.4 1.7 - 7.7 K/uL   Lymphocytes Relative 19 %   Lymphs Abs 1.4 0.7 - 4.0 K/uL   Monocytes Relative 6 %   Monocytes Absolute 0.4 0.1 - 1.0 K/uL   Eosinophils Relative 1 %   Eosinophils Absolute 0.1 0.0 - 0.5 K/uL   Basophils Relative 1 %   Basophils Absolute 0.0 0.0 - 0.1 K/uL   Immature Granulocytes 0 %   Abs Immature Granulocytes 0.03 0.00 - 0.07 K/uL    Comment: Performed at [facility], [address] [place], [place] [address]  Protime-INR     Status: None   Collection Time: 11/22/22 12:19 AM  Result Value Ref Range   Prothrombin Time 13.9 11.4 - 15.2 seconds   INR 1.1 0.8 - 1.2    Comment: (NOTE) INR goal varies based on device and disease states. Performed at [facility], [address] [place], [place] [address]   APTT     Status: Abnormal   Collection Time: 11/22/22 12:19 AM  Result Value Ref Range   aPTT 21 (L) 24 - 36 seconds    Comment: Performed at [facility], [address], [place], [place] [address]  Blood Culture (routine x 2)     Status: None (Preliminary result)   Collection Time: 11/22/22 12:19 AM   Specimen: BLOOD RIGHT ARM  Result Value Ref Range   Specimen Description BLOOD RIGHT ARM    Special Requests      BOTTLES DRAWN AEROBIC AND ANAEROBIC Blood Culture adequate volume   Culture      NO GROWTH 1 DAY Performed at [facility], [address] [place], [place] [address]    Report Status PENDING   Urinalysis, w/ Reflex to Culture (Infection Suspected) -Urine, Catheterized     Status: Abnormal   Collection Time: 11/22/22 12:19 AM  Result Value Ref Range   Specimen Source URINE, CATHETERIZED     Color, Urine YELLOW YELLOW   APPearance CLOUDY (A) CLEAR   Specific Gravity, Urine 1.009 1.005 - 1.030   pH 5.0 5.0 - 8.0   Glucose, UA >=500 (A) NEGATIVE mg/dL   Hgb urine dipstick NEGATIVE NEGATIVE   Bilirubin Urine NEGATIVE NEGATIVE   Ketones, ur NEGATIVE NEGATIVE mg/dL   Protein, ur NEGATIVE NEGATIVE mg/dL   Nitrite NEGATIVE NEGATIVE   Leukocytes,Ua NEGATIVE NEGATIVE   RBC / HPF 0-5 0 - 5 RBC/hpf   WBC, UA 0-5 0 - 5 WBC/hpf    Comment:        Reflex urine culture not performed if WBC <=10, OR if Squamous epithelial cells >5. If Squamous epithelial cells >5 suggest recollection.    Bacteria, UA RARE (A) NONE SEEN   Squamous Epithelial / HPF 21-50 0 - 5 /HPF   Mucus PRESENT    Hyaline Casts, UA PRESENT     Comment: Performed at [facility] Lab, [address], [place], [place] [address]  Lipase, blood     Status: None   Collection Time: 11/22/22 12:19 AM  Result Value Ref Range   Lipase 25 11 - 51 U/L    Comment: Performed at [facility], [address] [place], [place] [address]  Troponin I (High Sensitivity)     Status: None   Collection Time: 11/22/22 12:19  AM  Result Value Ref Range   Troponin I (High Sensitivity) 7 <18 ng/L    Comment: (NOTE) Elevated high sensitivity troponin I (hsTnI) values and significant  changes across serial measurements may suggest ACS but many other  chronic and acute conditions are known to elevate hsTnI results.  Refer to the "Links" section for chest pain algorithms and additional  guidance. Performed at [facility], [address], [place], [place] [address]   Resp panel by RT-PCR (RSV, Flu A&B, Covid) Anterior Nasal Swab     Status: None   Collection Time: 11/22/22 12:19 AM   Specimen: Anterior Nasal Swab  Result Value Ref Range   SARS Coronavirus 2 by RT PCR NEGATIVE NEGATIVE   Influenza A by PCR NEGATIVE NEGATIVE   Influenza B by PCR NEGATIVE NEGATIVE    Comment: (NOTE) The Xpert Xpress SARS-CoV-2/FLU/RSV plus  assay is intended as an aid in the diagnosis of influenza from Nasopharyngeal swab specimens and should not be used as a sole basis for treatment. Nasal washings and aspirates are unacceptable for Xpert Xpress SARS-CoV-2/FLU/RSV testing.  Fact Sheet for Patients: BloggerCourse.com  Fact Sheet for Healthcare Providers: SeriousBroker.it  This test is not yet approved or cleared by the [place] FDA and has been authorized for detection and/or diagnosis of SARS-CoV-2 by FDA under an Emergency Use Authorization (EUA). This EUA will remain in effect (meaning this test can be used) for the duration of the COVID-19 declaration under Section 564(b)(1) of the Act, 21 U.S.C. section 360bbb-3(b)(1), unless the authorization is terminated or revoked.     Resp Syncytial Virus by PCR NEGATIVE NEGATIVE    Comment: (NOTE) Fact Sheet for Patients: BloggerCourse.com  Fact Sheet for Healthcare Providers: SeriousBroker.it  This test is not yet approved or cleared by the [place] FDA and has been authorized for detection and/or diagnosis of SARS-CoV-2 by FDA under an Emergency Use Authorization (EUA). This EUA will remain in effect (meaning this test can be used) for the duration of the COVID-19 declaration under Section 564(b)(1) of the Act, 21 U.S.C. section 360bbb-3(b)(1), unless the authorization is terminated or revoked.  Performed at [facility], [address], [place], [place] [address]   Rapid urine drug screen (hospital performed)     Status: None   Collection Time: 11/22/22 12:19 AM  Result Value Ref Range   Opiates NONE DETECTED NONE DETECTED   Cocaine NONE DETECTED NONE DETECTED   Benzodiazepines NONE DETECTED NONE DETECTED   Amphetamines NONE DETECTED NONE DETECTED   Tetrahydrocannabinol NONE DETECTED NONE DETECTED   Barbiturates NONE DETECTED NONE DETECTED     Comment: (NOTE) DRUG SCREEN FOR MEDICAL PURPOSES ONLY.  IF CONFIRMATION IS NEEDED FOR ANY PURPOSE, NOTIFY LAB WITHIN 5 DAYS.  LOWEST DETECTABLE LIMITS FOR URINE DRUG SCREEN Drug Class                     Cutoff (ng/mL) Amphetamine and metabolites    1000 Barbiturate and metabolites    200 Benzodiazepine                 200 Opiates and metabolites        300 Cocaine and metabolites        300 THC                            50 Performed at [facility], [address], [place], [place]  [address]   I-Stat Lactic Acid, ED     Status: Abnormal   Collection Time: 11/22/22 12:53 AM  Result Value Ref Range   Lactic Acid, Venous 2.5 (HH) 0.5 - 1.9 mmol/L   Comment NOTIFIED PHYSICIAN   Blood Culture (routine x 2)     Status: None (Preliminary result)   Collection Time: 11/22/22  1:41 AM   Specimen: BLOOD RIGHT HAND  Result Value Ref Range   Specimen Description BLOOD RIGHT HAND    Special Requests      BOTTLES DRAWN AEROBIC ONLY Blood Culture adequate volume   Culture      NO GROWTH 1 DAY Performed at [facility], [address], [place], [place] [address]    Report Status PENDING   Troponin I (High Sensitivity)     Status: None   Collection Time: 11/22/22  3:46 AM  Result Value Ref Range   Troponin I (High Sensitivity) 4 <18 ng/L    Comment: (NOTE) Elevated high sensitivity troponin I (hsTnI) values and significant  changes across serial measurements may suggest ACS but many other  chronic and acute conditions are known to elevate hsTnI results.  Refer to the "Links" section for chest pain algorithms and additional  guidance. Performed at [facility], [address], [place], [place] [address]   I-Stat Lactic Acid, ED     Status: Abnormal   Collection Time: 11/22/22  3:52 AM  Result Value Ref Range   Lactic Acid, Venous 3.5 (HH) 0.5 - 1.9 mmol/L   Comment NOTIFIED PHYSICIAN   Occult bld gastric/duodenum (cup to lab)     Status: Abnormal   Collection  Time: 11/22/22  4:15 AM  Result Value Ref Range   pH, Gastric NOT DONE    Occult Blood, Gastric POSITIVE (A) NEGATIVE    Comment: Performed at [facility], [address], [place], [place] [address]  CBG monitoring, ED     Status: Abnormal   Collection Time: 11/22/22  9:01 AM  Result Value Ref Range   Glucose-Capillary 357 (H) 70 - 99 mg/dL    Comment: Glucose reference range applies only to samples taken after fasting for at least 8 hours.  Renal function panel     Status: Abnormal   Collection Time: 11/22/22  9:20 AM  Result Value Ref Range   Sodium 137 135 - 145 mmol/L   Potassium 4.0 3.5 - 5.1 mmol/L   Chloride 102 98 - 111 mmol/L   CO2 20 (L) 22 - 32 mmol/L   Glucose, Bld 379 (H) 70 - 99 mg/dL    Comment: Glucose reference range applies only to samples taken after fasting for at least 8 hours.   BUN 23 (H) 6 - 20 mg/dL   Creatinine, Ser 2.95 (H) 0.44 - 1.00 mg/dL   Calcium 8.7 (L) 8.9 - 10.3 mg/dL   Phosphorus 3.1 2.5 - 4.6 mg/dL   Albumin 3.3 (L) 3.5 - 5.0 g/dL   GFR, Estimated 41 (L) >60 mL/min    Comment: (NOTE) Calculated using the CKD-EPI Creatinine Equation (2021)    Anion gap 15 5 - 15    Comment: Performed at [facility], [address], [place], [place] [address]  CBC     Status: Abnormal   Collection Time: 11/22/22  9:20 AM  Result Value Ref Range   WBC 12.6 (H) 4.0 - 10.5 K/uL   RBC 4.17 3.87 - 5.11 MIL/uL   Hemoglobin 11.1 (L) 12.0 -  15.0 g/dL   HCT 01.0 (L) 93.2 - 35.5 %   MCV 78.4 (L) 80.0 - 100.0 fL   MCH 26.6 26.0 - 34.0 pg   MCHC 33.9 30.0 - 36.0 g/dL   RDW 73.2 (H) 20.2 - 54.2 %   Platelets 425 (H) 150 - 400 K/uL   nRBC 0.0 0.0 - 0.2 %    Comment: Performed at [facility], [address] [place], [place] [address]  Lactic acid, plasma     Status: Abnormal   Collection Time: 11/22/22  9:20 AM  Result Value Ref Range   Lactic Acid, Venous 2.4 (HH) 0.5 - 1.9 mmol/L    Comment: CRITICAL RESULT CALLED TO, READ BACK BY AND  VERIFIED WITH Franklyn Lor, RN 1102 11/22/2022 SANDOVAL K Performed at [facility] Lab, [address] [place], [place] [address]   Ammonia     Status: Abnormal   Collection Time: 11/22/22  9:20 AM  Result Value Ref Range   Ammonia 45 (H) 9 - 35 umol/L    Comment: Performed at [facility], [address] [place], [place] [address]  Magnesium     Status: None   Collection Time: 11/22/22  9:20 AM  Result Value Ref Range   Magnesium 1.7 1.7 - 2.4 mg/dL    Comment: Performed at [facility], [address] [place], [place] [address]  CBG monitoring, ED     Status: Abnormal   Collection Time: 11/22/22 12:07 PM  Result Value Ref Range   Glucose-Capillary 276 (H) 70 - 99 mg/dL    Comment: Glucose reference range applies only to samples taken after fasting for at least 8 hours.  Hemoglobin and hematocrit, blood     Status: Abnormal   Collection Time: 11/22/22  3:36 PM  Result Value Ref Range   Hemoglobin 9.8 (L) 12.0 - 15.0 g/dL   HCT 71.0 (L) 62.6 - 94.8 %    Comment: Performed at [facility], [address] [place], [place] [address]  CBG monitoring, ED     Status: Abnormal   Collection Time: 11/22/22  3:45 PM  Result Value Ref Range   Glucose-Capillary 198 (H) 70 - 99 mg/dL    Comment: Glucose reference range applies only to samples taken after fasting for at least 8 hours.  CBG monitoring, ED     Status: Abnormal   Collection Time: 11/22/22  7:53 PM  Result Value Ref Range   Glucose-Capillary 143 (H) 70 - 99 mg/dL    Comment: Glucose reference range applies only to samples taken after fasting for at least 8 hours.  Beta-hydroxybutyric acid     Status: Abnormal   Collection Time: 11/22/22 11:44 PM  Result Value Ref Range   Beta-Hydroxybutyric Acid 0.77 (H) 0.05 - 0.27 mmol/L    Comment: Performed at [facility], [address] [place], [place] [address]  TSH     Status: None   Collection Time: 11/22/22 11:44 PM  Result Value Ref Range   TSH 0.880  0.350 - 4.500 uIU/mL    Comment: Performed by a 3rd Generation assay with a functional sensitivity of <=0.01 uIU/mL. Performed at [facility], [address], [place], [place] [address]   Hemoglobin and hematocrit, blood     Status: Abnormal   Collection Time: 11/22/22 11:44 PM  Result Value Ref Range   Hemoglobin 10.2 (L) 12.0 - 15.0 g/dL   HCT 29.9 (L) 37.1 -  46.0 %    Comment: Performed at [facility], [address], [place], [place] [address]  Troponin I (High Sensitivity)     Status: None   Collection Time: 11/22/22 11:44 PM  Result Value Ref Range   Troponin I (High Sensitivity) 6 <18 ng/L    Comment: (NOTE) Elevated high sensitivity troponin I (hsTnI) values and significant  changes across serial measurements may suggest ACS but many other  chronic and acute conditions are known to elevate hsTnI results.  Refer to the "Links" section for chest pain algorithms and additional  guidance. Performed at [facility], [address], [place], [place] [address]   CBG monitoring, ED     Status: Abnormal   Collection Time: 11/23/22 12:02 AM  Result Value Ref Range   Glucose-Capillary 194 (H) 70 - 99 mg/dL    Comment: Glucose reference range applies only to samples taken after fasting for at least 8 hours.  Lactic acid, plasma     Status: None   Collection Time: 11/23/22  1:47 AM  Result Value Ref Range   Lactic Acid, Venous 1.1 0.5 - 1.9 mmol/L    Comment: Performed at [facility], [address] [place], [place] [address]  Troponin I (High Sensitivity)     Status: None   Collection Time: 11/23/22  1:47 AM  Result Value Ref Range   Troponin I (High Sensitivity) 6 <18 ng/L    Comment: (NOTE) Elevated high sensitivity troponin I (hsTnI) values and significant  changes across serial measurements may suggest ACS but many other  chronic and acute conditions are known to elevate hsTnI results.  Refer to the "Links" section for chest pain algorithms and  additional  guidance. Performed at [facility], [address], [place], [place] [address]   CBG monitoring, ED     Status: Abnormal   Collection Time: 11/23/22  3:40 AM  Result Value Ref Range   Glucose-Capillary 172 (H) 70 - 99 mg/dL    Comment: Glucose reference range applies only to samples taken after fasting for at least 8 hours.  CBG monitoring, ED     Status: Abnormal   Collection Time: 11/23/22  4:29 AM  Result Value Ref Range   Glucose-Capillary 181 (H) 70 - 99 mg/dL    Comment: Glucose reference range applies only to samples taken after fasting for at least 8 hours.  CBG monitoring, ED     Status: Abnormal   Collection Time: 11/23/22  7:34 AM  Result Value Ref Range   Glucose-Capillary 169 (H) 70 - 99 mg/dL    Comment: Glucose reference range applies only to samples taken after fasting for at least 8 hours.  CBC     Status: Abnormal   Collection Time: 11/23/22  7:39 AM  Result Value Ref Range   WBC 8.3 4.0 - 10.5 K/uL   RBC 3.91 3.87 - 5.11 MIL/uL   Hemoglobin 10.2 (L) 12.0 - 15.0 g/dL   HCT 29.5 (L) 62.1 - 30.8 %   MCV 80.8 80.0 - 100.0 fL   MCH 26.1 26.0 - 34.0 pg   MCHC 32.3 30.0 - 36.0 g/dL   RDW 65.7 (H) 84.6 - 96.2 %   Platelets 365 150 - 400 K/uL   nRBC 0.0 0.0 - 0.2 %    Comment: Performed at [facility], [address], [place], [place] [address]  Comprehensive metabolic panel     Status: Abnormal   Collection  Time: 11/23/22  7:39 AM  Result Value Ref Range   Sodium 139 135 - 145 mmol/L   Potassium 3.4 (L) 3.5 - 5.1 mmol/L   Chloride 108 98 - 111 mmol/L   CO2 20 (L) 22 - 32 mmol/L   Glucose, Bld 175 (H) 70 - 99 mg/dL    Comment: Glucose reference range applies only to samples taken after fasting for at least 8 hours.   BUN 14 6 - 20 mg/dL   Creatinine, Ser 3.08 0.44 - 1.00 mg/dL   Calcium 8.8 (L) 8.9 - 10.3 mg/dL   Total Protein 6.8 6.5 - 8.1 g/dL   Albumin 3.4 (L) 3.5 - 5.0 g/dL   AST 14 (L) 15 - 41 U/L   ALT 14 0 - 44 U/L    Alkaline Phosphatase 58 38 - 126 U/L   Total Bilirubin 0.5 0.3 - 1.2 mg/dL   GFR, Estimated >65 >78 mL/min    Comment: (NOTE) Calculated using the CKD-EPI Creatinine Equation (2021)    Anion gap 11 5 - 15    Comment: Performed at [facility] Lab, [address], [place], [place] [address]  Magnesium     Status: Abnormal   Collection Time: 11/23/22  7:39 AM  Result Value Ref Range   Magnesium 1.6 (L) 1.7 - 2.4 mg/dL    Comment: Performed at [facility], [address], [place], [place] [address]  Phosphorus     Status: Abnormal   Collection Time: 11/23/22  7:39 AM  Result Value Ref Range   Phosphorus 1.7 (L) 2.5 - 4.6 mg/dL    Comment: Performed at [facility], [address] [place], [place] [address]  CBG monitoring, ED     Status: Abnormal   Collection Time: 11/23/22  1:07 PM  Result Value Ref Range   Glucose-Capillary 178 (H) 70 - 99 mg/dL    Comment: Glucose reference range applies only to samples taken after fasting for at least 8 hours.  CBG monitoring, ED     Status: Abnormal   Collection Time: 11/23/22  3:31 PM  Result Value Ref Range   Glucose-Capillary 177 (H) 70 - 99 mg/dL    Comment: Glucose reference range applies only to samples taken after fasting for at least 8 hours.   [place] EKG SITE RITE  Result Date: 11/23/2022 If Site Rite image not attached, placement could not be confirmed due to current cardiac rhythm.  CT CHEST ABDOMEN PELVIS WO CONTRAST  Result Date: 11/22/2022 CLINICAL DATA:  Sepsis and mental status change EXAM: CT CHEST, ABDOMEN AND PELVIS WITHOUT CONTRAST TECHNIQUE: Multidetector CT imaging of the chest, abdomen and pelvis was performed following the standard protocol without IV contrast. RADIATION DOSE REDUCTION: This exam was performed according to the departmental dose-optimization program which includes automated exposure control, adjustment of the mA and/or kV according to patient size and/or use of iterative reconstruction  technique. COMPARISON:  Abdominal CT 10/31/2022 FINDINGS: CT CHEST FINDINGS Cardiovascular: No acute vascular findings. Normal heart size. No pericardial effusion. Extensive atheromatous calcification of the aorta and coronaries. Prior CABG. Aberrant right subclavian artery. Mediastinum/Nodes: No mass or adenopathy. Enteric tube which traverses the esophagus. Small sliding hiatal hernia. Lungs/Pleura: Band of opacity in the right lower lobe is stable from prior and attributed to scar. Low volume lungs, mild hazy densities likely atelectasis with motion. There is no edema, consolidation, effusion, or pneumothorax. Musculoskeletal: No acute finding CT ABDOMEN PELVIS FINDINGS Lower chest:  No contributory findings. Hepatobiliary:  No focal liver abnormality.No evidence of biliary obstruction or stone. Pancreas: Calcifications are likely vascular.  No acute finding. Spleen: Unremarkable. Adrenals/Urinary Tract: Negative adrenals. No hydronephrosis or stone. Unremarkable bladder. Stomach/Bowel: No obstruction. No appendicitis. Duodenal diverticulum, uncomplicated. Vascular/Lymphatic: No acute vascular abnormality. Extensive atheromatous calcification of the aorta and iliacs. No mass or adenopathy. Reproductive:No pathologic findings. Other: No ascites or pneumoperitoneum. Spiculated density in the lower midline abdominal wall remain stable consistent with scarring. Musculoskeletal: No acute abnormalities. IMPRESSION: 1. No acute finding or specific cause for history of sepsis. 2. Advanced atherosclerosis. Electronically Signed   By: Tiburcio Pea M.D.   On: 11/22/2022 04:03   CT HEAD WO CONTRAST ( )  Result Date: 11/22/2022 CLINICAL DATA:  Mental status change, sepsis EXAM: CT HEAD WITHOUT CONTRAST TECHNIQUE: Contiguous axial images were obtained from the base of the skull through the vertex without intravenous contrast. RADIATION DOSE REDUCTION: This exam was performed according to the departmental  dose-optimization program which includes automated exposure control, adjustment of the mA and/or kV according to patient size and/or use of iterative reconstruction technique. COMPARISON:  11/03/2022 FINDINGS: Brain: No evidence of acute infarction, hemorrhage, mass, mass effect, or midline shift. No hydrocephalus or extra-axial fluid collection. 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 Additional remote lacunar infarcts in the bilateral basal ganglia and thalami and in the right cerebellum. Periventricular white matter changes, likely the sequela of chronic small vessel ischemic disease. Vascular: No hyperdense vessel. Atherosclerotic calcifications in the intracranial carotid and vertebral arteries. Skull: Negative for fracture or focal lesion. Sinuses/Orbits: No acute finding. Other: The mastoid air cells are well aerated.  Nasogastric tube. IMPRESSION: No acute intracranial process. Electronically Signed   By: Wiliam Ke M.D.   On: 11/22/2022 03:53   DG Abdomen 1 View  Result Date: 11/22/2022 CLINICAL DATA:  NG tube placement EXAM: ABDOMEN - 1 VIEW COMPARISON:  10/31/2022 FINDINGS: Limited field of view for tube placement verification. An enteric tube has been placed with tip projecting over the upper abdomen at the midline consistent with location in the body of the stomach. Visualized bowel gas pattern is normal without gaseous distention of bowel demonstrated. Lung bases are clear. Postoperative changes in the mediastinum. IMPRESSION: Enteric tube tip projects over the upper abdomen consistent with location in the body of the stomach. Electronically Signed   By: Burman Nieves M.D.   On: 11/22/2022 03:31   DG Chest Port 1 View  Result Date: 11/21/2022 CLINICAL DATA:  Question of sepsis to evaluate for abnormality. Low blood sugar. EXAM: PORTABLE CHEST 1 VIEW COMPARISON:  11/04/2022 FINDINGS: Postoperative changes in the mediastinum. Shallow inspiration. Heart size and  pulmonary vascularity are normal for technique. Developing linear atelectasis or infiltration in the mid lungs. No pleural effusions. No pneumothorax. 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 Calcification of the aorta. IMPRESSION: Shallow inspiration with developing linear atelectasis or infiltration in the mid lungs. Electronically Signed   By: Burman Nieves M.D.   On: 11/21/2022 23:25    Pending Labs Unresulted Labs (From admission, onward)     Start     Ordered   11/22/22 1526  Hemoglobin and hematocrit, blood  Now then every 8 hours,   R      11/22/22 1525            Vitals/Pain Today's Vitals   11/23/22 1245 11/23/22 1300 11/23/22 1330 11/23/22 1400  BP:  (!) 164/118  (!) 168/104  Pulse: (!) 106 (!) 115 (!) 109   Resp: Marland Kitchen)  9 (!) 27 20 17   Temp:  98 F (36.7 C)    TempSrc:      SpO2: 100% 99% 100%   Weight:      Height:        Isolation Precautions No active isolations  Medications Medications  pantoprazole (PROTONIX) injection 40 mg (40 mg Intravenous Given 11/22/22 2206)    Followed by  pantoprazole (PROTONIX) injection 40 mg (has no administration in time range)  acetaminophen (TYLENOL) tablet 650 mg (has no administration in time range)    Or  acetaminophen (TYLENOL) suppository 650 mg (has no administration in time range)  ondansetron (ZOFRAN) injection 4 mg (4 mg Intravenous Given 11/22/22 1300)  LORazepam (ATIVAN) injection 0.5 mg (0.5 mg Intravenous Given 11/22/22 1516)  insulin aspart (novoLOG) injection 0-9 Units ( Subcutaneous Not Given 11/23/22 1538)  magnesium sulfate IVPB 2 g 50 mL (has no administration in time range)  potassium PHOSPHATE 30 mmol in dextrose 5 % 500 mL infusion (has no administration in time range)  atorvastatin (LIPITOR) tablet 80 mg (80 mg Oral Given 11/23/22 1546)  ezetimibe (ZETIA) tablet 10 mg (has no administration in time range)  ranolazine (RANEXA) 12 hr tablet 500 mg (has no administration in time range)  escitalopram (LEXAPRO) tablet  20 mg (20 mg Oral Given 11/23/22 1545)  methimazole (TAPAZOLE) tablet 5 mg (5 mg Oral Given 11/23/22 1547)  tamsulosin (FLOMAX) capsule 0.4 mg (has no administration in time range)  baclofen (LIORESAL) tablet 10 mg (10 mg Oral Not Given 11/23/22 1554)  gabapentin (NEURONTIN) capsule 100 mg (100 mg Oral Not Given 11/23/22 1554)  topiramate (TOPAMAX) tablet 25 mg (25 mg Oral Given 11/23/22 1546)  albuterol (PROVENTIL) (2.5 MG/3ML) 0.083% nebulizer solution 2.5 mg (has no administration in time range)  budesonide (PULMICORT) nebulizer solution 0.25 mg (has no administration in time range)  metoprolol tartrate (LOPRESSOR) tablet 25 mg (25 mg Oral Given 11/23/22 1546)  labetalol (NORMODYNE) injection 10 mg (has no administration in time range)  cefTRIAXone (ROCEPHIN) 1 g in sodium chloride 0.9 % 100 mL IVPB (0 g Intravenous Stopped 11/22/22 0319)  azithromycin (ZITHROMAX) 500 mg in sodium chloride 0.9 % 250 mL IVPB (0 mg Intravenous Stopped 11/22/22 0505)  metoCLOPramide (REGLAN) injection 10 mg (10 mg Intravenous Given 11/22/22 0243)  sodium chloride 0.9 % bolus 1,000 mL (1,000 mLs Intravenous Bolus 11/22/22 0255)  pantoprazole (PROTONIX) injection 40 mg (40 mg Intravenous Given 11/22/22 0624)  sodium chloride 0.9 % bolus 1,000 mL (0 mLs Intravenous Stopped 11/22/22 1515)    Mobility manual wheelchair     Focused Assessments Neuro Assessment Handoff:  Swallow screen pass? Yes  Cardiac Rhythm: Sinus tachycardia       Neuro Assessment: Exceptions to [facility] Neuro Checks:      Has TPA been given? No If patient is a Neuro Trauma and patient is going to OR before floor call report to 4N Charge nurse: [phone] or [phone]   R Recommendations: See Admitting Provider Note  Report given to:   Additional Notes: Pt husband at bedside.  PICC to be placed when IV team arrives on the unit.  IV meds delayed d/t inability to contact husband, despite multiple attempts.  MD aware. Pt alert  and able to follow commands, though husband states she is acting confused.  '

## 2022-11-23 NOTE — ED Provider Notes (Signed)
  Physical Exam  BP (!) 150/89   Pulse (!) 105   Temp 97.9 F (36.6 C) (Oral)   Resp (!) 24   Ht 5\' 2"  (1.575 m)   Wt 88 kg   SpO2 100%   BMI 35.48 kg/m   Physical Exam  Procedures  Ultrasound ED Peripheral IV (Provider)  Date/Time: 11/23/2022 4:20 AM  Performed by: Darrick Grinder, PA-C Authorized by: Darrick Grinder, PA-C   Procedure details:    Indications: multiple failed IV attempts and poor IV access     Skin Prep: chlorhexidine gluconate     Location:  Right AC   Angiocath:  20 G   Bedside Ultrasound Guided: Yes     Images: archived     Patient tolerated procedure without complications: Yes   Comments:     Access failed   ED Course / MDM    Medical Decision Making Amount and/or Complexity of Data Reviewed Labs: ordered. Radiology: ordered. ECG/medicine tests: ordered.  Risk Prescription drug management. Decision regarding hospitalization.         Darrick Grinder, PA-C 11/23/22 1610    Maia Plan, MD 11/27/22 1004

## 2022-11-23 NOTE — ED Notes (Signed)
Husband at bedside.  States pt is still not back to her normal self.  States she is asking for him to go over to the next room and get her aid.

## 2022-11-23 NOTE — ED Notes (Signed)
Patient pulled her IV access out. Patient restarted the IV pump that was paused and allowed iv fluids to run on floor. Patient instructed not to push any buttons on any medical equipment. Pump moved out of patient's reach.

## 2022-11-23 NOTE — Progress Notes (Signed)
Restricted extremity bracelet noted on RUE rather than LUE.  LUE with contractures present.  Pt stated the RUE was restricted due to blisters.  Blisters noted on R anterior forearm in square pattern resembling prior dressing placement.  No blisters present on upper arm region at time of PICC placement.  No other known reasons for restricted extremity armband to be on that arm.

## 2022-11-24 DIAGNOSIS — I5032 Chronic diastolic (congestive) heart failure: Secondary | ICD-10-CM | POA: Diagnosis not present

## 2022-11-24 DIAGNOSIS — R112 Nausea with vomiting, unspecified: Secondary | ICD-10-CM | POA: Diagnosis not present

## 2022-11-24 DIAGNOSIS — G9341 Metabolic encephalopathy: Secondary | ICD-10-CM | POA: Diagnosis not present

## 2022-11-24 DIAGNOSIS — N179 Acute kidney failure, unspecified: Secondary | ICD-10-CM | POA: Diagnosis not present

## 2022-11-24 LAB — HEMOGLOBIN AND HEMATOCRIT, BLOOD
HCT: 33.6 % — ABNORMAL LOW (ref 36.0–46.0)
HCT: 34.3 % — ABNORMAL LOW (ref 36.0–46.0)
Hemoglobin: 11 g/dL — ABNORMAL LOW (ref 12.0–15.0)
Hemoglobin: 11.4 g/dL — ABNORMAL LOW (ref 12.0–15.0)

## 2022-11-24 LAB — COMPREHENSIVE METABOLIC PANEL
ALT: 14 U/L (ref 0–44)
AST: 14 U/L — ABNORMAL LOW (ref 15–41)
Albumin: 3.4 g/dL — ABNORMAL LOW (ref 3.5–5.0)
Alkaline Phosphatase: 74 U/L (ref 38–126)
Anion gap: 9 (ref 5–15)
BUN: 8 mg/dL (ref 6–20)
CO2: 22 mmol/L (ref 22–32)
Calcium: 8.9 mg/dL (ref 8.9–10.3)
Chloride: 104 mmol/L (ref 98–111)
Creatinine, Ser: 0.77 mg/dL (ref 0.44–1.00)
GFR, Estimated: >60 mL/min (ref >60)
Glucose, Bld: 240 mg/dL — ABNORMAL HIGH (ref 70–99)
Potassium: 3.6 mmol/L (ref 3.5–5.1)
Sodium: 135 mmol/L (ref 135–145)
Total Bilirubin: 0.7 mg/dL (ref 0.3–1.2)
Total Protein: 7.3 g/dL (ref 6.5–8.1)

## 2022-11-24 LAB — CBC
HCT: 34 % — ABNORMAL LOW (ref 36.0–46.0)
Hemoglobin: 11.5 g/dL — ABNORMAL LOW (ref 12.0–15.0)
MCH: 26.4 pg (ref 26.0–34.0)
MCHC: 33.8 g/dL (ref 30.0–36.0)
MCV: 78 fL — ABNORMAL LOW (ref 80.0–100.0)
Platelets: 385 10*3/uL (ref 150–400)
RBC: 4.36 MIL/uL (ref 3.87–5.11)
RDW: 15.6 % — ABNORMAL HIGH (ref 11.5–15.5)
WBC: 9.4 10*3/uL (ref 4.0–10.5)
nRBC: 0 % (ref 0.0–0.2)

## 2022-11-24 LAB — GLUCOSE, CAPILLARY
Glucose-Capillary: 275 mg/dL — ABNORMAL HIGH (ref 70–99)
Glucose-Capillary: 248 mg/dL — ABNORMAL HIGH (ref 70–99)
Glucose-Capillary: 214 mg/dL — ABNORMAL HIGH (ref 70–99)
Glucose-Capillary: 242 mg/dL — ABNORMAL HIGH (ref 70–99)
Glucose-Capillary: 208 mg/dL — ABNORMAL HIGH (ref 70–99)
Glucose-Capillary: 183 mg/dL — ABNORMAL HIGH (ref 70–99)

## 2022-11-24 LAB — HEMOGLOBIN A1C
Hgb A1c MFr Bld: 8.5 % — ABNORMAL HIGH (ref 4.8–5.6)
Mean Plasma Glucose: 197.25 mg/dL

## 2022-11-24 LAB — MAGNESIUM: Magnesium: 2 mg/dL (ref 1.7–2.4)

## 2022-11-24 LAB — PHOSPHORUS: Phosphorus: 3.3 mg/dL (ref 2.5–4.6)

## 2022-11-24 MED ORDER — INSULIN ASPART 100 UNIT/ML IJ SOLN
0.0000 [IU] | Freq: Every day | INTRAMUSCULAR | Status: DC
  Administered 2022-11-29: 4 [IU] via SUBCUTANEOUS
  Administered 2022-11-30: 2 [IU] via SUBCUTANEOUS
  Administered 2022-12-01: 4 [IU] via SUBCUTANEOUS
  Administered 2022-12-02 – 2022-12-04 (×3): 3 [IU] via SUBCUTANEOUS
  Administered 2022-12-05: 4 [IU] via SUBCUTANEOUS

## 2022-11-24 MED ORDER — INSULIN ASPART 100 UNIT/ML IJ SOLN
3.0000 [IU] | Freq: Three times a day (TID) | INTRAMUSCULAR | Status: DC
  Administered 2022-11-24 – 2022-11-25 (×3): 3 [IU] via SUBCUTANEOUS

## 2022-11-24 MED ORDER — CLOPIDOGREL BISULFATE 75 MG PO TABS
75.0000 mg | ORAL_TABLET | Freq: Every day | ORAL | Status: DC
Start: 2022-11-24 — End: 2022-12-06
  Administered 2022-11-24 – 2022-12-06 (×13): 75 mg via ORAL
  Filled 2022-11-24 (×13): qty 1

## 2022-11-24 MED ORDER — INSULIN ASPART 100 UNIT/ML IJ SOLN
0.0000 [IU] | Freq: Three times a day (TID) | INTRAMUSCULAR | Status: DC
  Administered 2022-11-24: 5 [IU] via SUBCUTANEOUS
  Administered 2022-11-25: 3 [IU] via SUBCUTANEOUS
  Administered 2022-11-25: 5 [IU] via SUBCUTANEOUS
  Administered 2022-11-26 (×3): 3 [IU] via SUBCUTANEOUS
  Administered 2022-11-27: 2 [IU] via SUBCUTANEOUS
  Administered 2022-11-27: 3 [IU] via SUBCUTANEOUS
  Administered 2022-11-27 – 2022-11-28 (×2): 5 [IU] via SUBCUTANEOUS
  Administered 2022-11-28: 2 [IU] via SUBCUTANEOUS
  Administered 2022-11-28: 11 [IU] via SUBCUTANEOUS
  Administered 2022-11-29: 3 [IU] via SUBCUTANEOUS
  Administered 2022-11-29: 8 [IU] via SUBCUTANEOUS
  Administered 2022-11-29: 2 [IU] via SUBCUTANEOUS
  Administered 2022-11-30: 8 [IU] via SUBCUTANEOUS
  Administered 2022-11-30: 3 [IU] via SUBCUTANEOUS
  Administered 2022-11-30: 8 [IU] via SUBCUTANEOUS
  Administered 2022-12-01 (×2): 5 [IU] via SUBCUTANEOUS
  Administered 2022-12-01 – 2022-12-02 (×2): 11 [IU] via SUBCUTANEOUS
  Administered 2022-12-02 (×2): 3 [IU] via SUBCUTANEOUS
  Administered 2022-12-03 (×2): 8 [IU] via SUBCUTANEOUS
  Administered 2022-12-04: 5 [IU] via SUBCUTANEOUS
  Administered 2022-12-04: 11 [IU] via SUBCUTANEOUS
  Administered 2022-12-04: 5 [IU] via SUBCUTANEOUS
  Administered 2022-12-05: 3 [IU] via SUBCUTANEOUS
  Administered 2022-12-05: 8 [IU] via SUBCUTANEOUS
  Administered 2022-12-05: 5 [IU] via SUBCUTANEOUS
  Administered 2022-12-06 (×2): 8 [IU] via SUBCUTANEOUS
  Administered 2022-12-06: 5 [IU] via SUBCUTANEOUS

## 2022-11-24 MED ORDER — INSULIN GLARGINE-YFGN 100 UNIT/ML ~~LOC~~ SOLN
15.0000 [IU] | Freq: Every day | SUBCUTANEOUS | Status: DC
  Administered 2022-11-24 – 2022-11-29 (×6): 15 [IU] via SUBCUTANEOUS
  Filled 2022-11-24 (×7): qty 0.15

## 2022-11-24 MED ORDER — POTASSIUM CHLORIDE CRYS ER 20 MEQ PO TBCR
40.0000 meq | EXTENDED_RELEASE_TABLET | Freq: Once | ORAL | Status: AC
  Administered 2022-11-24: 40 meq via ORAL
  Filled 2022-11-24: qty 2

## 2022-11-24 MED ORDER — ENOXAPARIN SODIUM 40 MG/0.4ML IJ SOSY
40.0000 mg | PREFILLED_SYRINGE | INTRAMUSCULAR | Status: DC
  Administered 2022-11-24: 40 mg via SUBCUTANEOUS
  Filled 2022-11-24 (×2): qty 0.4

## 2022-11-24 NOTE — Plan of Care (Signed)

## 2022-11-24 NOTE — Progress Notes (Signed)
Speech Language Pathology Treatment: Dysphagia  Patient Details Name: Lori [name] MRN: [id number] DOB: Dec 27, 1970 Today's Date: 11/24/2022 Time: 1315-1330 SLP Time Calculation (min) (ACUTE ONLY): 15 min  Assessment / Plan / Recommendation Clinical Impression  Patient seen by SLP for skilled treatment focused on dysphagia goals. Patient awake/alert and spouse present when SLP arrived in room. Her RN was finishing up giving her meds. She required cues to respond and to make choices of PO's when presented in front of her. She continues with confusion, ?hallucinations as she told her husband that there were bugs on the wall and also asked him to "step out" "so we don't get stuck in here". She was receptive to advanced PO trials of diced, canned peaches. Mastication was slow but complete and no PO residuals in oral cavity s/o swallows. No overt s/s aspiration and swallow initiation appeared adequate. SLP recommending to advance to Dys 2 (minced) solids and continue with thin liquids. SLP will follow for diet toleration and ability to advance further.    HPI HPI: Lori [name] is a 52 yo female presenting to ED 10/18 with increased somnolence and n/v. MBS 01/23/21 with moderate oropharyngeal dysphagia and silent aspiration. She was recommended a diet of Dys 1 textures with nectar thick liquids. Seen extensively throughout 2023 with significant oral dysphagia, although was ultimately recommended a Dys 3 diet with thin liquids. 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  Recommendations may be updated based on patient status, additional functional criteria and insurance authorization.     Recommendations  Diet recommendations: Dysphagia 2 (fine chop);Thin liquid Liquids provided via: Cup;Straw Medication Administration: Whole meds with puree Supervision: Full supervision/cueing for compensatory strategies;Staff to assist with self feeding Compensations: Minimize environmental distractions;Slow rate;Small sips/bites Postural Changes and/or Swallow Maneuvers: Seated upright 90 degrees                  Oral care BID   Frequent or constant Supervision/Assistance Dysphagia, unspecified (R13.10)     Continue with current plan of care    Angela Nevin, MA, CCC-SLP Speech Therapy

## 2022-11-24 NOTE — Progress Notes (Addendum)
Patient is alert and oriented x2. Flat affect. Has bizarre periods of yelling and constantly asking for husband. Reluctant to take meds but will after some convincing (individually). Refused some q2h turns last night. Day team reported patient's heart rate and bp are elevated and that the provider was aware. Saw meds with parameters already in. Had been a yellow MEWS on day shift and scored a yellow MEWS for night shift x1 as well due to blood pressure and heart rate which is not a new finding. Treated with prn labetalol and both decreased. She removed NGT on day shift and day team stated that MD was aware and that no order for a reinsertion had been made. Patient denies additional needs.

## 2022-11-24 NOTE — Progress Notes (Signed)
PROGRESS NOTE  Kensy Huitt Becton LKG:[id number] DOB: 1970/08/16   PCP: Caesar Bookman, NP  Patient is from: Home.  Lives with husband.  Uses wheelchair at baseline.  DOA: 11/21/2022 LOS: 2  Chief complaints Chief Complaint  Patient presents with   Altered Mental Status     Brief Narrative / Interim history: 52 y.o. female with PMH of CVA with left hemiparesis/spasticity/aphasia, IDDM-2, CAD, diastolic CHF, HTN, hyperthyroidism, anxiety and depression presenting with behavioral change, increased somnolence and nausea and vomiting, and admitted with acute metabolic encephalopathy, AKI, intractable nausea and vomiting, hematemesis and tachycardia.   In ED, HR ranges from 100s to 150s.  Soft BP.  100% on RA.  ABG basically normal. Na 131.  BMP glucose 366.  Bicarb 16.  Anion gap 17. Cr 1.96 (0.98 on 10/2) at.  BUN 27.  LFT normal.  Limited RVP CBC, UA, UDS, serial troponin, coag labs, CT chest, abdomen and pelvis without significant finding.  Gastric Hemoccult positive.  Lactic acid was elevated.  Patient was started on IV fluid.  Blood cultures obtained.  Started on IV fluid, broad-spectrum antibiotics, IV Protonix and antiemetics and admitted.   Blood cultures NGTD.  Lactic acidosis, hypotension and AKI resolved.  Still with tachycardia but improved to 110s.  BP elevated.  Nausea and vomiting seems to have resolved.   Subjective: Seen and examined earlier this morning.  No major events overnight of this morning.  Still with some tachycardia.  She has no complaints but not interacting much.  She is awake and alert.  She denies pain.  No report of nausea or vomiting overnight.  Objective: Vitals:   11/24/22 0500 11/24/22 0534 11/24/22 0733 11/24/22 0852  BP:  (!) 134/95 (!) 134/108   Pulse:  (!) 114 (!) 123   Resp:   17   Temp:   98.5 F (36.9 C)   TempSrc:   Oral   SpO2:   96% 96%  Weight: 90 kg     Height:        Examination:  GENERAL: No apparent distress.   Nontoxic. HEENT: MMM.  Vision and hearing grossly intact.  NECK: Supple.  No apparent JVD.  RESP:  No IWOB.  Fair aeration bilaterally. CVS: HR in 110s.Marland Kitchen Heart sounds normal.  ABD/GI/GU: BS+. Abd soft, NTND.  MSK/EXT:   Left hemiparesis and contracture. SKIN: no apparent skin lesion or wound NEURO: Awake and alert.  Oriented x 4 except date.  Less interactive today.  Follows commands.  Left hemiparesis and contracture. PSYCH: Calm.  Less interactive today.  Procedures:  None  Microbiology summarized: COVID-19, influenza and RSV PCR nonreactive Blood cultures NGTD  Assessment and plan: Acute metabolic encephalopathy/agitation: Likely due to dehydration and polypharmacy.  No clear source of infection.  CT head and ABG without acute finding.  Encephalopathy seems to have resolved.  Might have some behavioral issues.  She is awake and alert and oriented x 4 except the date.  Less interactive today. -Minimize sedating medications -Reorientation and delirium precaution -Treat treatable causes -Currently on full liquid diet.  Anticipate SLP reevaluation today     Dehydration, nausea and vomiting: Seems to have resolved.  CT chest, abdomen and pelvis without acute finding.  Abdominal exam benign.  -Continue monitoring of IV fluid -Continue full liquid diet pending SLP reevaluation -Antiemetics as needed   AKI: Likely due to the above.  Baseline Cr ~0.9-1.0.  AKI resolved. Recent Labs    11/01/22 1609 11/04/22 0054 11/04/22 0101 11/04/22 6644  11/05/22 3875 11/06/22 0131 11/22/22 0019 11/22/22 0920 11/23/22 0739 11/24/22 0820  BUN 28* 30* 29* 28* 15 13 27* 23* 14 8  CREATININE 1.01 1.83* 2.00* 1.68* 1.13* 0.98 1.96* 1.53* 0.89 0.77  -Continue monitoring of IV fluid. -Encourage oral intake  Positive gastric Hemoccult: Likely from Mallory-Weiss tear but cannot rule out gastritis or PUD.  Slight drop in Hgb likely dilutional from IV fluid.  Overall, H&H seems to be at baseline.   Nausea and vomiting seems to have resolved. Recent Labs    11/06/22 0131 11/22/22 0015 11/22/22 0019 11/22/22 0920 11/22/22 1536 11/22/22 2344 11/23/22 0739 11/23/22 1626 11/23/22 2333 11/24/22 0820  HGB 10.3* 12.6 12.6 11.1* 9.8* 10.2* 10.2* 10.5* 11.4* 11.5*  -Continue IV Protonix for now -Resume DVT prophylaxis with subcu Lovenox -Resume home Plavix. -Monitor H&H -Continue full liquid diet pending reevaluation by SLP   History of CVA with residual left hemiplegia/spasticity and dysarthria: CT head without acute finding. -Resume home Plavix. -Continue home statin   History of CAD: Serial troponin negative.  EKG without acute ischemic finding. -Resume home Plavix -Continue home Ranexa and statin.   Hypotension/history of essential hypertension: Hypotension resolved.  Blood pressure elevated. -Discontinue IV fluid -Continue metoprolol 50 mg twice daily -IV labetalol as needed   Sinus tachycardia: Due to dehydration and agitation?  TSH normal.  Low suspicion for infection.  Improved. -P.o. metoprolol as above -Optimize electrolytes.   Uncontrolled IDDM-2 with hyperglycemia: A1c 9.8% on 08/27/2022 Recent Labs  Lab 11/23/22 1531 11/23/22 1637 11/23/22 1950 11/23/22 2308 11/24/22 0350  GLUCAP 177* 189* 238* 274* 275*  -Change SSI to moderate -NovoLog 3 units 3 times daily with meals -Semglee 15 units daily -Further adjustment as appropriate. -Follow hemoglobin A1c  History of diastolic CHF: Appears euvolemic overall.  No cardiopulmonary symptoms. -Continue monitoring   Anxiety and depression: Stable -Continue home meds.   Lactic acidosis: Likely due to dehydration.  Infectious workup unrevealing.  Resolved.  Antibiotics discontinued  Hypokalemia/hypomagnesemia/hypophosphatemia -Monitor replenish as appropriate  Morbid obesity: Elevated BMI with comorbidity as above. Body mass index is 36.29 kg/m.           DVT prophylaxis:  enoxaparin (LOVENOX)  injection 40 mg Start: 11/24/22 1130 SCDs Start: 11/22/22 0523  Code Status: Full code Family Communication: None at bedside Level of care: Progressive Status is: Inpatient Remains inpatient appropriate because: Sinus tachycardia, uncontrolled blood pressure and electrolyte derangements   Final disposition: Likely home in the next 24 to 48 hours Consultants:  Pulmonology-signed off  55 minutes with more than 50% spent in reviewing records, counseling patient/family and coordinating care.   Sch Meds:  Scheduled Meds:  atorvastatin  80 mg Oral Daily   baclofen  10 mg Oral TID   budesonide  0.25 mg Nebulization BID   Chlorhexidine Gluconate Cloth  6 each Topical Daily   enoxaparin (LOVENOX) injection  40 mg Subcutaneous Q24H   escitalopram  20 mg Oral q morning   ezetimibe  10 mg Oral Daily   gabapentin  100 mg Oral TID   insulin aspart  0-9 Units Subcutaneous Q4H   methimazole  5 mg Oral q morning   metoprolol tartrate  50 mg Oral BID   pantoprazole (PROTONIX) IV  40 mg Intravenous Q8H   Followed by   Melene Muller ON 11/25/2022] pantoprazole (PROTONIX) IV  40 mg Intravenous Q12H   potassium chloride  40 mEq Oral Once   ranolazine  500 mg Oral BID   sodium chloride flush  10-40  mL Intracatheter Q12H   tamsulosin  0.4 mg Oral QHS   topiramate  25 mg Oral BID   Continuous Infusions:   PRN Meds:.acetaminophen **OR** acetaminophen, albuterol, labetalol, LORazepam, ondansetron (ZOFRAN) IV, sodium chloride flush  Antimicrobials: Anti-infectives (From admission, onward)    Start     Dose/Rate Route Frequency Ordered Stop   11/22/22 1400  linezolid (ZYVOX) IVPB 600 mg  Status:  Discontinued        600 mg 300 mL/hr over 60 Minutes Intravenous Every 12 hours 11/22/22 1338 11/23/22 0844   11/22/22 1400  ceFEPIme (MAXIPIME) 2 g in sodium chloride 0.9 % 100 mL IVPB  Status:  Discontinued        2 g 200 mL/hr over 30 Minutes Intravenous Every 12 hours 11/22/22 1343 11/23/22 0844    11/22/22 1345  ceFEPIme (MAXIPIME) 2 g in sodium chloride 0.9 % 100 mL IVPB  Status:  Discontinued        2 g 200 mL/hr over 30 Minutes Intravenous  Once 11/22/22 1335 11/22/22 1343   11/22/22 1345  linezolid (ZYVOX) IVPB 600 mg  Status:  Discontinued        600 mg 300 mL/hr over 60 Minutes Intravenous Every 12 hours 11/22/22 1338 11/22/22 1338   11/22/22 0215  cefTRIAXone (ROCEPHIN) 1 g in sodium chloride 0.9 % 100 mL IVPB        1 g 200 mL/hr over 30 Minutes Intravenous  Once 11/22/22 0208 11/22/22 0319   11/22/22 0215  azithromycin (ZITHROMAX) 500 mg in sodium chloride 0.9 % 250 mL IVPB        500 mg 250 mL/hr over 60 Minutes Intravenous  Once 11/22/22 0208 11/22/22 0505        I have personally reviewed the following labs and images: CBC: Recent Labs  Lab 11/22/22 0019 11/22/22 0920 11/22/22 1536 11/22/22 2344 11/23/22 0739 11/23/22 1626 11/23/22 2333 11/24/22 0820  WBC 7.3 12.6*  --   --  8.3  --   --  9.4  NEUTROABS 5.4  --   --   --   --   --   --   --   HGB 12.6 11.1*   < > 10.2* 10.2* 10.5* 11.4* 11.5*  HCT 38.6 32.7*   < > 30.8* 31.6* 31.5* 34.3* 34.0*  MCV 81.6 78.4*  --   --  80.8  --   --  78.0*  PLT 386 425*  --   --  365  --   --  385   < > = values in this interval not displayed.   BMP &GFR Recent Labs  Lab 11/22/22 0015 11/22/22 0019 11/22/22 0920 11/23/22 0739 11/24/22 0820  NA 133* 131* 137 139 135  K 4.0 5.1 4.0 3.4* 3.6  CL  --  98 102 108 104  CO2  --  16* 20* 20* 22  GLUCOSE  --  366* 379* 175* 240*  BUN  --  27* 23* 14 8  CREATININE  --  1.96* 1.53* 0.89 0.77  CALCIUM  --  8.8* 8.7* 8.8* 8.9  MG  --   --  1.7 1.6* 2.0  PHOS  --   --  3.1 1.7* 3.3   Estimated Creatinine Clearance: 87.8 mL/min (by C-G formula based on SCr of 0.77 mg/dL). Liver & Pancreas: Recent Labs  Lab 11/22/22 0019 11/22/22 0920 11/23/22 0739 11/24/22 0820  AST 28  --  14* 14*  ALT 16  --  14 14  ALKPHOS  78  --  58 74  BILITOT 1.2  --  0.5 0.7  PROT 7.3  --   6.8 7.3  ALBUMIN 3.7 3.3* 3.4* 3.4*   Recent Labs  Lab 11/22/22 0019  LIPASE 25   Recent Labs  Lab 11/22/22 0920  AMMONIA 45*   Diabetic: No results for input(s): "HGBA1C" in the last 72 hours. Recent Labs  Lab 11/23/22 1531 11/23/22 1637 11/23/22 1950 11/23/22 2308 11/24/22 0350  GLUCAP 177* 189* 238* 274* 275*   Cardiac Enzymes: No results for input(s): "CKTOTAL", "CKMB", "CKMBINDEX", "TROPONINI" in the last 168 hours. No results for input(s): "PROBNP" in the last 8760 hours. Coagulation Profile: Recent Labs  Lab 11/22/22 0019  INR 1.1   Thyroid Function Tests: Recent Labs    11/22/22 2344  TSH 0.880   Lipid Profile: No results for input(s): "CHOL", "HDL", "LDLCALC", "TRIG", "CHOLHDL", "LDLDIRECT" in the last 72 hours. Anemia Panel: No results for input(s): "VITAMINB12", "FOLATE", "FERRITIN", "TIBC", "IRON", "RETICCTPCT" in the last 72 hours. Urine analysis:    Component Value Date/Time   COLORURINE YELLOW 11/22/2022 0019   APPEARANCEUR CLOUDY (A) 11/22/2022 0019   LABSPEC 1.009 11/22/2022 0019   PHURINE 5.0 11/22/2022 0019   GLUCOSEU >=500 (A) 11/22/2022 0019   HGBUR NEGATIVE 11/22/2022 0019   BILIRUBINUR NEGATIVE 11/22/2022 0019   KETONESUR NEGATIVE 11/22/2022 0019   PROTEINUR NEGATIVE 11/22/2022 0019   NITRITE NEGATIVE 11/22/2022 0019   LEUKOCYTESUR NEGATIVE 11/22/2022 0019   Sepsis Labs: Invalid input(s): "PROCALCITONIN", "LACTICIDVEN"  Microbiology: Recent Results (from the past 240 hour(s))  Blood Culture (routine x 2)     Status: None (Preliminary result)   Collection Time: 11/22/22 12:19 AM   Specimen: BLOOD RIGHT ARM  Result Value Ref Range Status   Specimen Description BLOOD RIGHT ARM  Final   Special Requests   Final    BOTTLES DRAWN AEROBIC AND ANAEROBIC Blood Culture adequate volume   Culture   Final    NO GROWTH 2 DAYS Performed at [facility], [address] [place], [place] [address]    Report Status PENDING   Incomplete  Resp panel by RT-PCR (RSV, Flu A&B, Covid) Anterior Nasal Swab     Status: None   Collection Time: 11/22/22 12:19 AM   Specimen: Anterior Nasal Swab  Result Value Ref Range Status   SARS Coronavirus 2 by RT PCR NEGATIVE NEGATIVE Final   Influenza A by PCR NEGATIVE NEGATIVE Final   Influenza B by PCR NEGATIVE NEGATIVE Final    Comment: (NOTE) The Xpert Xpress SARS-CoV-2/FLU/RSV plus assay is intended as an aid in the diagnosis of influenza from Nasopharyngeal swab specimens and should not be used as a sole basis for treatment. Nasal washings and aspirates are unacceptable for Xpert Xpress SARS-CoV-2/FLU/RSV testing.  Fact Sheet for Patients: BloggerCourse.com  Fact Sheet for Healthcare Providers: SeriousBroker.it  This test is not yet approved or cleared by the [place] FDA and has been authorized for detection and/or diagnosis of SARS-CoV-2 by FDA under an Emergency Use Authorization (EUA). This EUA will remain in effect (meaning this test can be used) for the duration of the COVID-19 declaration under Section 564(b)(1) of the Act, 21 U.S.C. section 360bbb-3(b)(1), unless the authorization is terminated or revoked.     Resp Syncytial Virus by PCR NEGATIVE NEGATIVE Final    Comment: (NOTE) Fact Sheet for Patients: BloggerCourse.com  Fact Sheet for Healthcare Providers: SeriousBroker.it  This test is not yet approved or cleared by the [place] FDA  and has been authorized for detection and/or diagnosis of SARS-CoV-2 by FDA under an Emergency Use Authorization (EUA). This EUA will remain in effect (meaning this test can be used) for the duration of the COVID-19 declaration under Section 564(b)(1) of the Act, 21 U.S.C. section 360bbb-3(b)(1), unless the authorization is terminated or revoked.  Performed at [facility], [address]  [place], [place] [address]   Blood Culture (routine x 2)     Status: None (Preliminary result)   Collection Time: 11/22/22  1:41 AM   Specimen: BLOOD RIGHT HAND  Result Value Ref Range Status   Specimen Description BLOOD RIGHT HAND  Final   Special Requests   Final    BOTTLES DRAWN AEROBIC ONLY Blood Culture adequate volume   Culture   Final    NO GROWTH 2 DAYS Performed at [facility], [address] [place], [place] [address]    Report Status PENDING  Incomplete    Radiology Studies: No results found.    Akashdeep Chuba T. Vi Biddinger [facility]  If 7PM-7AM, please contact night-coverage [email or web address] 11/24/2022, 11:26 AM

## 2022-11-24 NOTE — Evaluation (Signed)
Occupational Therapy Evaluation Patient Details Name: Lori [name] MRN: [id number] DOB: 04-Sep-1970 Today's Date: 11/24/2022   History of Present Illness 52 y.o. female presented to [facility] ED with behavioral change, increased somnolence and nausea and vomiting. Dx Dehydration with AKI. PMH of CVA with left hemiparesis/spasticity/aphasia, IDDM-2, CAD, diastolic CHF, HTN, hyperthyroidism, anxiety and depression presenting with behavioral change, increased somnolence and nausea and vomiting.   Clinical Impression   No family in room to determine baseline - although RN stated that he had been in and out of the room, very supportive. PLOF gleaned from previous PT eval as Pt largely non-verbal throughout session, very pleasant with good eye contact. Pt participated in self-feeding (magic cup), PROM for LUE, EOB transfer with max A>return supine. RN stated that she has been communicating more with her than OT saw today during session. OT will continue to follow acutely. At this time recommending post-acute rehab of <3 hours daily to maximize safety and independence in ADL and functional transfers unless husband feels confident taking her home and PCA still present. Next session plan on OOB transfer with +2 assist as well as attempting mid-morning to maximize alertness.        If plan is discharge home, recommend the following: Two people to help with walking and/or transfers;A lot of help with bathing/dressing/bathroom;Assist for transportation;Help with stairs or ramp for entrance;Supervision due to cognitive status    Functional Status Assessment  Patient has had a recent decline in their functional status and demonstrates the ability to make significant improvements in function in a reasonable and predictable amount of time.  Equipment Recommendations  None recommended by OT (pt has appropriate DME at home?)    Recommendations for Other Services PT consult;Speech consult     Precautions /  Restrictions Precautions Precautions: Fall Precaution Comments: chronic L side weakness 2* CVA Restrictions Weight Bearing Restrictions: No Other Position/Activity Restrictions: L sided weakness      Mobility Bed Mobility Overal bed mobility: Needs Assistance Bed Mobility: Rolling, Sidelying to Sit, Sit to Sidelying Rolling: Max assist Sidelying to sit: Max assist, HOB elevated     Sit to sidelying: Max assist General bed mobility comments: max A with multimodal cues with rolling Lt and Rt, cues for rail use when rolling Lt able to reach, grasp, and pull. Assist for trunk and LEs with transitions.    Transfers                   General transfer comment: not attempted today due to decreased arousal      Balance Overall balance assessment: Needs assistance Sitting-balance support: Feet unsupported, Single extremity supported Sitting balance-Leahy Scale: Poor Sitting balance - Comments: at least mod for seated balance today                                   ADL either performed or assessed with clinical judgement   ADL Overall ADL's : Needs assistance/impaired Eating/Feeding: Maximal assistance;Bed level (HOB elevated) Eating/Feeding Details (indicate cue type and reason): hand over hand for feeding with spoon, Pt able to hold spoon without problem, decreased initiation, after first bite, took a few more with OT prioviding pre-loaded spoon Grooming: Wash/dry face;Minimal assistance;Bed level (HOB elevated)   Upper Body Bathing: Maximal assistance   Lower Body Bathing: Maximal assistance   Upper Body Dressing : Maximal assistance   Lower Body Dressing: Maximal assistance   Toilet Transfer: +2  for physical assistance   Toileting- Clothing Manipulation and Hygiene: Total assistance       Functional mobility during ADLs: Maximal assistance;+2 for physical assistance General ADL Comments: decreased arousal this session, decreased communication from  Pt     Vision Patient Visual Report: Other (comment) (did not verbalize one way or the other.) Additional Comments: Pt makes eye contact appropriately, can look in all directions, not formally assessed     Perception         Praxis         Pertinent Vitals/Pain Pain Assessment Pain Assessment: Faces Faces Pain Scale: No hurt Pain Intervention(s): Monitored during session, Repositioned     Extremity/Trunk Assessment Upper Extremity Assessment Upper Extremity Assessment: Right hand dominant;LUE deficits/detail LUE Deficits / Details: chronic weakness from previous CVA. contracture at MCP, tone at elbow, decreased ROM at shoulder - all require PROM - potentially a resting hand splint for at night? LUE Coordination: decreased fine motor;decreased gross motor (chronic)   Lower Extremity Assessment Lower Extremity Assessment: Defer to PT evaluation   Cervical / Trunk Assessment Cervical / Trunk Assessment: Kyphotic   Communication Communication Communication: Difficulty communicating thoughts/reduced clarity of speech;Difficulty following commands/understanding Following commands: Follows one step commands inconsistently;Follows one step commands with increased time Cueing Techniques: Verbal cues;Tactile cues;Visual cues;Gestural cues   Cognition Arousal: Lethargic Behavior During Therapy: Flat affect Overall Cognitive Status: No family/caregiver present to determine baseline cognitive functioning                                 General Comments: Pt did not verbalize except to say "please" "no thank you" and "don't say the s word" RN states that Pt has been talking to her with limited expressive abilities. expressive eyes - but no clear communication today to idicate cognition     General Comments       Exercises Exercises: Other exercises Other Exercises Other Exercises: PROM of the digits, wrist, elbow, and shoulder of LUE   Shoulder Instructions       Home Living Family/patient expects to be discharged to:: Private residence Living Arrangements: Spouse/significant other Available Help at Discharge: Family;Available PRN/intermittently;Personal care attendant Type of Home: Apartment Home Access: Level entry Entrance Stairs-Number of Steps: 5   Home Layout: One level     Bathroom Shower/Tub: Chief Strategy Officer: Standard     Home Equipment: Hospital bed;Wheelchair - manual;Tub bench   Additional Comments: Aide daily for 3 hours/day. pt's husband works during the day. reports being evaluated for power wheelchair and CAPS program      Prior Functioning/Environment Prior Level of Function : Needs assist (gleaned from previous PT eval, Pt did not talk during OT eval)             Mobility Comments: Getting HHPT. Unable to state what they were working on but assume transfer training. States she has not tried walking yet at home. Was min A with platform RW earlier this yer per records. States platform RW is now broken. ADLs Comments: has home aide 3-4 hours/day 7 days per week, largely bed bound with transfers to [facility], has home purewick system        OT Problem List: Decreased strength;Decreased activity tolerance;Impaired balance (sitting and/or standing);Decreased cognition;Decreased safety awareness;Obesity      OT Treatment/Interventions: Self-care/ADL training    OT Goals(Current goals can be found in the care plan section) Acute Rehab OT Goals Patient  Stated Goal: none stated Time For Goal Achievement: 12/08/22 Potential to Achieve Goals: Fair ADL Goals Pt Will Perform Grooming: with set-up;sitting Pt Will Transfer to Toilet: with min assist;ambulating Pt Will Perform Toileting - Clothing Manipulation and hygiene: with min assist;sit to/from stand Pt/caregiver will Perform Home Exercise Program: Left upper extremity;Independently  OT Frequency: Min 1X/week    Co-evaluation              AM-PAC  OT "6 Clicks" Daily Activity     Outcome Measure Help from another person eating meals?: A Lot Help from another person taking care of personal grooming?: A Little Help from another person toileting, which includes using toliet, bedpan, or urinal?: A Lot Help from another person bathing (including washing, rinsing, drying)?: A Lot Help from another person to put on and taking off regular upper body clothing?: A Lot Help from another person to put on and taking off regular lower body clothing?: A Lot 6 Click Score: 13   End of Session Nurse Communication: Mobility status  Activity Tolerance: Patient limited by fatigue;Patient limited by lethargy Patient left: in bed;with call bell/phone within reach;with bed alarm set;with SCD's reapplied  OT Visit Diagnosis: Other abnormalities of gait and mobility (R26.89);Muscle weakness (generalized) (M62.81);Other symptoms and signs involving the nervous system (R29.898)                Time: 1027-2536 OT Time Calculation (min): 25 min Charges:  OT General Charges $OT Visit: 1 Visit OT Evaluation $OT Eval Moderate Complexity: 1 Mod OT Treatments $Self Care/Home Management : 8-22 mins  Nyoka Cowden OTR/L Acute Rehabilitation Services Office: [phone]  [facility] 11/24/2022, 5:21 PM

## 2022-11-25 DIAGNOSIS — R1310 Dysphagia, unspecified: Secondary | ICD-10-CM | POA: Diagnosis not present

## 2022-11-25 DIAGNOSIS — R000 Tachycardia, unspecified: Secondary | ICD-10-CM | POA: Diagnosis not present

## 2022-11-25 DIAGNOSIS — G9341 Metabolic encephalopathy: Secondary | ICD-10-CM | POA: Diagnosis not present

## 2022-11-25 DIAGNOSIS — E0590 Thyrotoxicosis, unspecified without thyrotoxic crisis or storm: Secondary | ICD-10-CM | POA: Diagnosis not present

## 2022-11-25 LAB — GLUCOSE, CAPILLARY
Glucose-Capillary: 179 mg/dL — ABNORMAL HIGH (ref 70–99)
Glucose-Capillary: 188 mg/dL — ABNORMAL HIGH (ref 70–99)
Glucose-Capillary: 191 mg/dL — ABNORMAL HIGH (ref 70–99)
Glucose-Capillary: 240 mg/dL — ABNORMAL HIGH (ref 70–99)
Glucose-Capillary: 55 mg/dL — ABNORMAL LOW (ref 70–99)
Glucose-Capillary: 58 mg/dL — ABNORMAL LOW (ref 70–99)
Glucose-Capillary: 92 mg/dL (ref 70–99)
Glucose-Capillary: 94 mg/dL (ref 70–99)
Glucose-Capillary: 95 mg/dL (ref 70–99)

## 2022-11-25 LAB — CBC
HCT: 33.5 % — ABNORMAL LOW (ref 36.0–46.0)
Hemoglobin: 11 g/dL — ABNORMAL LOW (ref 12.0–15.0)
MCH: 25.9 pg — ABNORMAL LOW (ref 26.0–34.0)
MCHC: 32.8 g/dL (ref 30.0–36.0)
MCV: 79 fL — ABNORMAL LOW (ref 80.0–100.0)
Platelets: 392 10*3/uL (ref 150–400)
RBC: 4.24 MIL/uL (ref 3.87–5.11)
RDW: 15.9 % — ABNORMAL HIGH (ref 11.5–15.5)
WBC: 9.2 10*3/uL (ref 4.0–10.5)
nRBC: 0 % (ref 0.0–0.2)

## 2022-11-25 LAB — RENAL FUNCTION PANEL
Albumin: 3.4 g/dL — ABNORMAL LOW (ref 3.5–5.0)
Anion gap: 11 (ref 5–15)
BUN: 14 mg/dL (ref 6–20)
CO2: 22 mmol/L (ref 22–32)
Calcium: 9.2 mg/dL (ref 8.9–10.3)
Chloride: 106 mmol/L (ref 98–111)
Creatinine, Ser: 1.16 mg/dL — ABNORMAL HIGH (ref 0.44–1.00)
GFR, Estimated: 57 mL/min — ABNORMAL LOW (ref >60)
Glucose, Bld: 185 mg/dL — ABNORMAL HIGH (ref 70–99)
Phosphorus: 3.4 mg/dL (ref 2.5–4.6)
Potassium: 4.3 mmol/L (ref 3.5–5.1)
Sodium: 139 mmol/L (ref 135–145)

## 2022-11-25 LAB — TSH: TSH: 1.306 u[IU]/mL (ref 0.350–4.500)

## 2022-11-25 LAB — MAGNESIUM: Magnesium: 2.1 mg/dL (ref 1.7–2.4)

## 2022-11-25 LAB — T4, FREE: Free T4: 1.48 ng/dL — ABNORMAL HIGH (ref 0.61–1.12)

## 2022-11-25 MED ORDER — RIVAROXABAN 10 MG PO TABS: 10.0000 mg | ORAL_TABLET | Freq: Every day | ORAL | Status: DC

## 2022-11-25 MED ORDER — RIVAROXABAN 10 MG PO TABS
10.0000 mg | ORAL_TABLET | Freq: Every day | ORAL | Status: DC
  Administered 2022-11-26 – 2022-12-06 (×11): 10 mg via ORAL
  Filled 2022-11-25 (×11): qty 1

## 2022-11-25 MED ORDER — METHIMAZOLE 10 MG PO TABS
10.0000 mg | ORAL_TABLET | Freq: Two times a day (BID) | ORAL | Status: DC
  Administered 2022-11-25 – 2022-11-26 (×2): 10 mg via ORAL
  Filled 2022-11-25 (×3): qty 1

## 2022-11-25 MED ORDER — PANTOPRAZOLE SODIUM 40 MG PO TBEC
40.0000 mg | DELAYED_RELEASE_TABLET | Freq: Two times a day (BID) | ORAL | Status: DC
  Administered 2022-11-25 – 2022-12-06 (×23): 40 mg via ORAL
  Filled 2022-11-25 (×23): qty 1

## 2022-11-25 NOTE — Care Management Important Message (Signed)
Important Message  Patient Details  Name: Lori [name] MRN: [id number] Date of Birth: 12-27-1970   Important Message Given:  Yes - Medicare IM     Dorena Bodo 11/25/2022, 3:23 PM

## 2022-11-25 NOTE — Plan of Care (Signed)

## 2022-11-25 NOTE — [place] FL2 (Signed)
[place] MEDICAID FL2 LEVEL OF CARE FORM     IDENTIFICATION  Patient Name: Lori [name] Birthdate: April 15, 1970 Sex: female Admission Date (Current Location): 11/21/2022  [facility] and [place] Number:  Producer, television/film/video and Address:  [facility] [place], [address], [place], [place] [address]      Provider Number: [id number]  Attending Physician Name and Address:  Elease Etienne, MD  Relative Name and Phone Number:       Current Level of Care: Hospital Recommended Level of Care: Skilled Nursing Facility Prior Approval Number:    Date Approved/Denied:   PASRR Number: [id number]  Discharge Plan: SNF    Current Diagnoses: Patient Active Problem List   Diagnosis Date Noted   Delirium 11/22/2022   Dehydration 11/22/2022   Acute encephalopathy 11/04/2022   Chronic anticoagulation 11/04/2022   Hypotension 11/04/2022   Acute metabolic encephalopathy 11/04/2022   Morbid obesity (HCC) 10/13/2022   Aspiration pneumonia (HCC) 10/13/2022   Chronic heart failure with preserved ejection fraction (HFpEF) (HCC) 10/13/2022   Normocytic anemia 10/13/2022   Acute pancreatitis 10/11/2022   Chest pain of uncertain etiology 10/06/2022   History of CAD (coronary artery disease) 10/06/2022   GAD (generalized anxiety disorder) 10/06/2022   Reactive airway disease 10/06/2022   UTI (urinary tract infection) 07/04/2022   Mild intermittent asthma, uncomplicated 07/04/2022   Lethargy 05/01/2022   Hyponatremia 04/30/2022   Hyperkalemia 04/30/2022   AKI (acute kidney injury) (HCC) 04/30/2022   Wheelchair dependence 04/08/2022   Spasticity as late effect of cerebrovascular accident (CVA) 04/08/2022   Left hemiplegia (HCC) 04/08/2022   Dyspnea 11/02/2021   GERD (gastroesophageal reflux disease) 11/02/2021   Nausea & vomiting 08/25/2021   Acute bronchitis 08/24/2021   Hypothyroidism 07/10/2021   Functional urinary incontinence 05/17/2021   MDD (major  depressive disorder), recurrent episode, severe (HCC) 04/09/2021   Diabetic gastroparesis (HCC) 03/16/2021   Migraines 03/16/2021   History of cerebral infarction    Debility 02/26/2021   DKA (diabetic ketoacidosis) (HCC) 02/19/2021   Weakness    Insulin dependent type 2 diabetes mellitus (HCC)    Aphasia    Spell of abnormal behavior    History of CVA with spastic paresis (cerebrovascular accident) 01/19/2021   Tachycardia 01/19/2021   CAD (coronary artery disease) 01/19/2021   Spastic hemiparesis of left 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 (HCC) 01/19/2021   Diabetes (HCC)    Hyperthyroidism    Essential hypertension    Pheochromocytoma    Anxiety and depression    HLD (hyperlipidemia)     Orientation RESPIRATION BLADDER Height & Weight     Self, Place  Normal Incontinent Weight: 191 lb 12.8 oz (87 kg) Height:  5\' 2"  (157.5 cm)  BEHAVIORAL SYMPTOMS/MOOD NEUROLOGICAL BOWEL NUTRITION STATUS      Incontinent Diet (see DC summary)  AMBULATORY STATUS COMMUNICATION OF NEEDS Skin   Extensive Assist Verbally Normal                       Personal Care Assistance Level of Assistance  Bathing, Feeding, Dressing Bathing Assistance: Maximum assistance Feeding assistance: Maximum assistance Dressing Assistance: Maximum assistance     Functional Limitations Info  Sight, Speech Sight Info: Impaired   Speech Info: Impaired (dysarthria, difficult speaking)    SPECIAL CARE FACTORS FREQUENCY  PT (By licensed PT), OT (By licensed OT)     PT Frequency: 5x/wk OT Frequency: 5x/wk  Contractures Contractures Info: Not present    Additional Factors Info  Code Status, Allergies, Psychotropic, Insulin Sliding Scale Code Status Info: Full Allergies Info: Asa (Aspirin), Contrast Media (Iodinated Contrast Media), Imitrex (Sumatriptan), Penicillins, Firvanq (Vancomycin), Cipro (Ciprofloxacin Hcl), Ms Contin (Morphine), Wound Dressing Adhesive Psychotropic  Info: Lexapro 20mg  daily in the morning Insulin Sliding Scale Info: see DC summary       Current Medications (11/25/2022):  This is the current hospital active medication list Current Facility-Administered Medications  Medication Dose Route Frequency Provider Last Rate Last Admin   acetaminophen (TYLENOL) tablet 650 mg  650 mg Oral Q6H PRN Howerter, Justin B, DO       Or   acetaminophen (TYLENOL) suppository 650 mg  650 mg Rectal Q6H PRN Howerter, Justin B, DO       albuterol (PROVENTIL) (2.5 MG/3ML) 0.083% nebulizer solution 2.5 mg  2.5 mg Nebulization Q6H PRN Gonfa, Taye T, MD       atorvastatin (LIPITOR) tablet 80 mg  80 mg Oral Daily Gonfa, Taye T, MD   80 mg at 11/25/22 0940   baclofen (LIORESAL) tablet 10 mg  10 mg Oral TID Candelaria Stagers T, MD   10 mg at 11/25/22 0939   budesonide (PULMICORT) nebulizer solution 0.25 mg  0.25 mg Nebulization BID Candelaria Stagers T, MD   0.25 mg at 11/25/22 0847   Chlorhexidine Gluconate Cloth 2 % PADS 6 each  6 each Topical Daily Almon Hercules, MD   6 each at 11/25/22 1003   clopidogrel (PLAVIX) tablet 75 mg  75 mg Oral Daily Candelaria Stagers T, MD   75 mg at 11/25/22 0940   escitalopram (LEXAPRO) tablet 20 mg  20 mg Oral q morning Candelaria Stagers T, MD   20 mg at 11/25/22 0940   ezetimibe (ZETIA) tablet 10 mg  10 mg Oral Daily Candelaria Stagers T, MD   10 mg at 11/25/22 0940   gabapentin (NEURONTIN) capsule 100 mg  100 mg Oral TID Candelaria Stagers T, MD   100 mg at 11/25/22 0940   insulin aspart (novoLOG) injection 0-15 Units  0-15 Units Subcutaneous TID WC Candelaria Stagers T, MD   5 Units at 11/25/22 1333   insulin aspart (novoLOG) injection 0-5 Units  0-5 Units Subcutaneous QHS Gonfa, Taye T, MD       insulin aspart (novoLOG) injection 3 Units  3 Units Subcutaneous TID WC Candelaria Stagers T, MD   3 Units at 11/25/22 1333   insulin glargine-yfgn (SEMGLEE) injection 15 Units  15 Units Subcutaneous Daily Candelaria Stagers T, MD   15 Units at 11/25/22 0940   labetalol (NORMODYNE) injection 10 mg   10 mg Intravenous Q2H PRN Candelaria Stagers T, MD   10 mg at 11/24/22 0459   LORazepam (ATIVAN) injection 0.5 mg  0.5 mg Intravenous Q6H PRN Howerter, Justin B, DO   0.5 mg at 11/22/22 1516   methimazole (TAPAZOLE) tablet 5 mg  5 mg Oral q morning Candelaria Stagers T, MD   5 mg at 11/25/22 0939   metoprolol tartrate (LOPRESSOR) tablet 50 mg  50 mg Oral BID Candelaria Stagers T, MD   50 mg at 11/25/22 0940   ondansetron (ZOFRAN) injection 4 mg  4 mg Intravenous Q6H PRN Howerter, Justin B, DO   4 mg at 11/22/22 1300   pantoprazole (PROTONIX) EC tablet 40 mg  40 mg Oral BID AC Hongalgi, Anand D, MD       ranolazine (RANEXA) 12 hr tablet 500 mg  500 mg Oral BID Candelaria Stagers T, MD   500 mg at 11/25/22 0939   [START ON 11/26/2022] rivaroxaban (XARELTO) tablet 10 mg  10 mg Oral Daily Hongalgi, Anand D, MD       sodium chloride flush (NS) 0.9 % injection 10-40 mL  10-40 mL Intracatheter Q12H Alanda Slim, Taye T, MD   10 mL at 11/24/22 2239   sodium chloride flush (NS) 0.9 % injection 10-40 mL  10-40 mL Intracatheter PRN Candelaria Stagers T, MD       tamsulosin (FLOMAX) capsule 0.4 mg  0.4 mg Oral QHS Candelaria Stagers T, MD   0.4 mg at 11/23/22 2109   topiramate (TOPAMAX) tablet 25 mg  25 mg Oral BID Almon Hercules, MD   25 mg at 11/25/22 1610     Discharge Medications: Please see discharge summary for a list of discharge medications.  Relevant Imaging Results:  Relevant Lab Results:   Additional Information SS#: [id number]  Baldemar Lenis, LCSW

## 2022-11-25 NOTE — TOC Initial Note (Signed)
Transition of Care [facility]) - Initial/Assessment Note    Patient Details  Name: Lori [name] MRN: [id number] Date of Birth: Mar 01, 1970  [facility] CM/SW Contact:    Baldemar Lenis, LCSW Phone Number: 11/25/2022, 3:36 PM  Clinical Narrative:       CSW met with patient's spouse, [name], at bedside to discuss recommendation for SNF. Spouse in agreement, said that the patient was walking with home health prior to admission and was managing her own bills. Spouse said that the patient was working on getting her Medicare extended because she received something in the mail that it would expire at the end of the month and he didn't know what to do about it. CSW encouraged spouse to bring in documentation if he can find the information that the patient received so that CSW can attempt to assist.   Patient attempting to communicate with spouse during conversation, but wasn't always able to be understood. Patient said to her husband clearly "I'm dying" multiple times, spouse became tearful and tried to tell the patient that she wasn't dying. CSW relayed information to RN.   Patient was active with [facility] for home health. CSW contacted [facility] to clarify baseline function: from PT note on 10/18, patient was able to complete stand pivot transfer with min assist and ambulated 75 feet twice with a rolling walker. CSW completed referral for SNF and faxed out, will follow with bed offers.   Expected Discharge Plan: Skilled Nursing Facility Barriers to Discharge: Continued Medical Work up, English as a second language teacher   Patient Goals and CMS Choice Patient states their goals for this hospitalization and ongoing recovery are:: patient unable to participate in goal setting, not fully oriented CMS Medicare.gov Compare Post Acute Care list provided to:: Patient Represenative (must comment) Choice offered to / list presented to : Spouse [place] ownership interest in [facility].provided to:: Spouse    Expected Discharge Plan and Services     Post Acute Care Choice: Skilled Nursing Facility Living arrangements for the past 2 months: Single Family Home                                      Prior Living Arrangements/Services Living arrangements for the past 2 months: Single Family Home Lives with:: Spouse Patient language and need for interpreter reviewed:: No Do you feel safe going back to the place where you live?: Yes      Need for Family Participation in Patient Care: Yes (Comment) Care giver support system in place?: No (comment) Current home services: Home OT, Home PT, Homehealth aide Criminal Activity/Legal Involvement Pertinent to Current Situation/Hospitalization: No - Comment as needed  Activities of Daily Living      Permission Sought/Granted Permission sought to share information with : Facility Medical sales representative, Family Supports Permission granted to share information with : Yes, Verbal Permission Granted  Share Information with NAME: [name]  Permission granted to share info w AGENCY: SNF  Permission granted to share info w Relationship: Spouse     Emotional Assessment Appearance:: Appears stated age Attitude/Demeanor/Rapport: Unable to Assess Affect (typically observed): Unable to Assess Orientation: : Oriented to Self, Oriented to Place Alcohol / Substance Use: Not Applicable Psych Involvement: No (comment)  Admission diagnosis:  AKI (acute kidney injury) (HCC) [N17.9] Altered mental status, unspecified altered mental status type [R41.82] Acute metabolic encephalopathy [G93.41] Patient Active Problem List   Diagnosis Date Noted  Delirium 11/22/2022   Dehydration 11/22/2022   Acute encephalopathy 11/04/2022   Chronic anticoagulation 11/04/2022   Hypotension 11/04/2022   Acute metabolic encephalopathy 11/04/2022   Morbid obesity (HCC) 10/13/2022   Aspiration pneumonia (HCC) 10/13/2022   Chronic heart  failure with preserved ejection fraction (HFpEF) (HCC) 10/13/2022   Normocytic anemia 10/13/2022   Acute pancreatitis 10/11/2022   Chest pain of uncertain etiology 10/06/2022   History of CAD (coronary artery disease) 10/06/2022   GAD (generalized anxiety disorder) 10/06/2022   Reactive airway disease 10/06/2022   UTI (urinary tract infection) 07/04/2022   Mild intermittent asthma, uncomplicated 07/04/2022   Lethargy 05/01/2022   Hyponatremia 04/30/2022   Hyperkalemia 04/30/2022   AKI (acute kidney injury) (HCC) 04/30/2022   Wheelchair dependence 04/08/2022   Spasticity as late effect of cerebrovascular accident (CVA) 04/08/2022   Left hemiplegia (HCC) 04/08/2022   Dyspnea 11/02/2021   GERD (gastroesophageal reflux disease) 11/02/2021   Nausea & vomiting 08/25/2021   Acute bronchitis 08/24/2021   Hypothyroidism 07/10/2021   Functional urinary incontinence 05/17/2021   MDD (major depressive disorder), recurrent episode, severe (HCC) 04/09/2021   Diabetic gastroparesis (HCC) 03/16/2021   Migraines 03/16/2021   History of cerebral infarction    Debility 02/26/2021   DKA (diabetic ketoacidosis) (HCC) 02/19/2021   Weakness    Insulin dependent type 2 diabetes mellitus (HCC)    Aphasia    Spell of abnormal behavior    History of CVA with spastic paresis (cerebrovascular accident) 01/19/2021   Tachycardia 01/19/2021   CAD (coronary artery disease) 01/19/2021   Spastic hemiparesis of left 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 (HCC) 01/19/2021   Diabetes (HCC)    Hyperthyroidism    Essential hypertension    Pheochromocytoma    Anxiety and depression    HLD (hyperlipidemia)    PCP:  Caesar Bookman, NP Pharmacy:   [facility] - [place], [place] - [address] [place] [address] Phone: [phone] Fax: [phone]  [facility] - [place], [place] - [address] [place]  [address] Phone: [phone] Fax: [phone]  [id number] - [place], [place] - [address] AT [facility] [address] [facility] [address] [place] [address] Phone: [phone] Fax: [phone]     Social Determinants of Health (SDOH) Social History: SDOH Screenings   Food Insecurity: No Food Insecurity (11/11/2022)  Housing: Patient Declined (11/04/2022)  Transportation Needs: No Transportation Needs (11/11/2022)  Utilities: Not At Risk (11/04/2022)  Depression (PHQ2-9): Low Risk  (11/01/2022)  Recent Concern: Depression (PHQ2-9) - Medium Risk (10/28/2022)  Tobacco Use: Low Risk  (11/04/2022)   SDOH Interventions:     Readmission Risk Interventions    11/05/2022    1:38 PM 10/15/2022   10:25 AM 08/29/2021    2:32 PM  Readmission Risk Prevention Plan  Transportation Screening Complete Complete Complete  Medication Review Oceanographer) Complete  Complete  PCP or Specialist appointment within 3-5 days of discharge Complete Complete Complete  HRI or Home Care Consult Complete Complete Complete  SW Recovery Care/Counseling Consult Complete Complete Complete  Palliative Care Screening Not Applicable Not Applicable Not Applicable  Skilled Nursing Facility Not Applicable Not Applicable Complete

## 2022-11-25 NOTE — Progress Notes (Addendum)
PROGRESS NOTE   Lori [name]  UXL:[id number]    DOB: 1970/10/01    DOA: 11/21/2022  PCP: Caesar Bookman, NP   I have briefly reviewed patients previous medical records in [facility].  Chief Complaint  Patient presents with   Altered Mental Status    Brief Hospital Course:  52 year old female, medical history significant for CVA with residual left hemiparesis/spasticity/dysphagia, DM2/IDDM, CAD, chronic diastolic CHF, HTN, anxiety/depression, pheochromocytoma s/p unilateral adrenalectomy 2018, hospitalization 9/29 - 10/3 for acute metabolic encephalopathy, presented to [facility] ED on 10/17 with altered mental status, nausea and vomiting.  She was admitted for acute metabolic encephalopathy, AKI, intractable nausea and vomiting, hematemesis and tachycardia.  Postadmission on 10/18, became hypotensive with SBP in the 70s that recovered after IV fluid boluses.  Nausea, vomiting, dehydration and AKI resolved.  Mental status has improved but not clear what is her baseline.  Ongoing poor oral intake.  Sinus tachycardia.   Assessment & Plan:  Principal Problem:   Acute metabolic encephalopathy Active Problems:   Nausea & vomiting   AKI (acute kidney injury) (HCC)   History of CVA with spastic paresis (cerebrovascular accident)   Hyperthyroidism   Essential hypertension   Insulin dependent type 2 diabetes mellitus (HCC)   Left hemiplegia (HCC)   History of CAD (coronary artery disease)   Chronic heart failure with preserved ejection fraction (HFpEF) (HCC)   Chronic anticoagulation   Tachycardia   Anxiety and depression   Aphasia   Debility   Hyponatremia   Hypotension   Delirium   Dehydration   Acute metabolic encephalopathy/agitation:  Likely due to dehydration and polypharmacy.   No clear source of infection.   CT head and ABG without acute finding.   Encephalopathy seems to have resolved.  Might have some behavioral issues.  Baseline mental status is not clear and may  need to elicit this from family. Minimize opioids and sedative meds.  Delirium precautions.  Ongoing reorientation.   Dehydration, nausea and vomiting:  Seems to have resolved.   CT chest, abdomen and pelvis without acute finding.  Abdominal exam benign.  Diet has been advanced to dysphagia 2 diet but per nursing, not really eating much.  This will be increased risk for recurrent dehydration.   AKI:  Likely due to the above.   Baseline Cr ~0.9-1.0.   AKI had resolved but creatinine has gone up from 0.77 on 10/20 to 1.16 on 10/21. Push fluids p.o.  Positive gastric Hemoccult:  Likely from Mallory-Weiss tear related to intractable nausea and vomiting, but cannot rule out gastritis or PUD.   Slight drop in Hgb likely dilutional from IV fluid.   Overall, H&H seems to be at baseline.  Nausea and vomiting seems to have resolved. Change Protonix from IV to p.o. Home Plavix has been resumed.  Also on chemical DVT prophylaxis with Lovenox. Monitor  Dysphagia:  Speech therapy has advance diet to dysphagia 2 diet and thin liquids Patient however needs to be fed and taking in very little per nursing at bedside.  Encourage p.o. intake.   History of CVA with residual left hemiplegia/spasticity and dysarthria:  CT head without acute finding. Continue home Plavix and statin. PT and OT eval evaluated and recommended SNF.   History of CAD:  Serial troponin negative.  EKG without acute ischemic finding. Continue home Plavix, Ranexa and statin.   Hypotension/history of essential hypertension:  Hypotension resolved.  Blood pressure elevated. Currently on metoprolol 50 Mg twice daily with good blood  pressure control but has sinus tachycardia.   Sinus tachycardia/hyperthyroidism:  Clinically euvolemic.  Has sustained sinus tachycardia in the 120s-130s.?  Related to underlying hypothyroidism. Check repeat TSH, free T3 and free T4. Continue current metoprolol 50 Mg twice daily and may need to  consider switching over to propranolol. May need to increase methimazole dose from 5 Mg daily to 5 Mg 3 times daily or 10 Mg twice daily.  Due to dehydration and agitation?  TSH normal.  Low suspicion for infection.  Improved.   Uncontrolled IDDM-2 with hyperglycemia:  A1c 8.5 on 10/20 Currently on Semglee 15 units daily, mealtime NovoLog 3 units 3 times daily and moderate SSI.  These are much lower dose insulins than her home dose, will uptitrate. CBGs mostly in the high 100s and occasionally in the 200s. Monitor closely and adjust as needed.   History of diastolic CHF:  Appears euvolemic overall.   Anxiety and depression:  - Stable -Continue home meds.   Lactic acidosis:  Likely due to dehydration.  Infectious workup unrevealing.  Resolved.  Antibiotics discontinued   Hypokalemia/hypomagnesemia/hypophosphatemia -Monitor replenish as appropriate  Pheochromocytoma, s/p unilateral adrenalectomy:  Chronic anticoagulation for DVT 2023 PTA was on prophylactic dose anticoagulation Xarelto 10 Mg daily, confirmed with pharmacy and resume.  Monitor closely for bleeding.  Duration of anticoagulation not clear,?  Lifelong.  Also on Plavix for CVA.  Obviously increases bleeding risk.  This needs to be closely evaluated as outpatient by her PCP.   Body mass index is 35.08 kg/m./Obesity   DVT prophylaxis: enoxaparin (LOVENOX) injection 40 mg Start: 11/24/22 1130 SCDs Start: 11/22/22 0523     Code Status: Full Code:  Family Communication: None at bedside Disposition:  Status is: Inpatient Remains inpatient appropriate because: Poor oral intake, needs to eat better, sustained sinus tachycardia, ruling out uncontrolled hyperthyroidism and may need medication adjustment.     Consultants:   PCCM  Procedures:     Antimicrobials:      Subjective:  Seen this morning.  Nursing at bedside feeding patient.  Per nursing, poor oral intake, consumed <25% of breakfast.  Has to be fed.   However drinking well by herself.  Patient is alert and oriented x 2.  Does state that she has hyperthyroidism.  Appears slightly confused at times.  Mumbles incomprehensibly.  Objective:   Vitals:   11/25/22 0500 11/25/22 0739 11/25/22 0847 11/25/22 1208  BP:  (!) 107/91  (!) 115/92  Pulse:  (!) 135  (!) 101  Resp:  17  18  Temp:  98.7 F (37.1 C)  98.6 F (37 C)  TempSrc:      SpO2:  95% 99% 97%  Weight: 87 kg     Height:        General exam: Middle-age female, moderately built and obese sitting up in bed being fed by nursing.  She herself is drinking out of a cup with straw without difficulty. Respiratory system: Clear to auscultation. Respiratory effort normal. Cardiovascular system: S1 & S2 heard, regular tachycardia cardia. No JVD, murmurs, rubs, gallops or clicks. No pedal edema.  Telemetry personally reviewed: Sustained sinus tachycardia in the 120s-130s. Gastrointestinal system: Abdomen is nondistended, soft and nontender. No organomegaly or masses felt. Normal bowel sounds heard. Central nervous system: Alert and oriented x 2. No focal neurological deficits.  No dysarthria or facial asymmetry appreciated. Extremities: Left upper extremity with contractures and grade 2 x 5 power.  Both lower extremities at least grade 3 x 5 power.  Right  upper extremity grade 5 x 5 power. Skin: No rashes, lesions or ulcers Psychiatry: Judgement and insight impaired. Mood & affect flat.     Data Reviewed:   I have personally reviewed following labs and imaging studies   CBC: Recent Labs  Lab 11/22/22 0019 11/22/22 0920 11/23/22 0739 11/23/22 1626 11/24/22 0820 11/24/22 1810 11/25/22 0520  WBC 7.3   < > 8.3  --  9.4  --  9.2  NEUTROABS 5.4  --   --   --   --   --   --   HGB 12.6   < > 10.2*   < > 11.5* 11.0* 11.0*  HCT 38.6   < > 31.6*   < > 34.0* 33.6* 33.5*  MCV 81.6   < > 80.8  --  78.0*  --  79.0*  PLT 386   < > 365  --  385  --  392   < > = values in this interval not  displayed.    Basic Metabolic Panel: Recent Labs  Lab 11/22/22 0019 11/22/22 0920 11/23/22 0739 11/24/22 0820 11/25/22 0520  NA 131* 137 139 135 139  K 5.1 4.0 3.4* 3.6 4.3  CL 98 102 108 104 106  CO2 16* 20* 20* 22 22  GLUCOSE 366* 379* 175* 240* 185*  BUN 27* 23* 14 8 14   CREATININE 1.96* 1.53* 0.89 0.77 1.16*  CALCIUM 8.8* 8.7* 8.8* 8.9 9.2  MG  --  1.7 1.6* 2.0 2.1  PHOS  --  3.1 1.7* 3.3 3.4    Liver Function Tests: Recent Labs  Lab 11/22/22 0019 11/22/22 0920 11/23/22 0739 11/24/22 0820 11/25/22 0520  AST 28  --  14* 14*  --   ALT 16  --  14 14  --   ALKPHOS 78  --  58 74  --   BILITOT 1.2  --  0.5 0.7  --   PROT 7.3  --  6.8 7.3  --   ALBUMIN 3.7 3.3* 3.4* 3.4* 3.4*    CBG: Recent Labs  Lab 11/25/22 0413 11/25/22 0758 11/25/22 1210  GLUCAP 188* 191* 240*    Microbiology Studies:   Recent Results (from the past 240 hour(s))  Blood Culture (routine x 2)     Status: None (Preliminary result)   Collection Time: 11/22/22 12:19 AM   Specimen: BLOOD RIGHT ARM  Result Value Ref Range Status   Specimen Description BLOOD RIGHT ARM  Final   Special Requests   Final    BOTTLES DRAWN AEROBIC AND ANAEROBIC Blood Culture adequate volume   Culture   Final    NO GROWTH 3 DAYS Performed at [facility], [address], [place], [place] [address]    Report Status PENDING  Incomplete  Resp panel by RT-PCR (RSV, Flu A&B, Covid) Anterior Nasal Swab     Status: None   Collection Time: 11/22/22 12:19 AM   Specimen: Anterior Nasal Swab  Result Value Ref Range Status   SARS Coronavirus 2 by RT PCR NEGATIVE NEGATIVE Final   Influenza A by PCR NEGATIVE NEGATIVE Final   Influenza B by PCR NEGATIVE NEGATIVE Final    Comment: (NOTE) The Xpert Xpress SARS-CoV-2/FLU/RSV plus assay is intended as an aid in the diagnosis of influenza from Nasopharyngeal swab specimens and should not be used as a sole basis for treatment. Nasal washings and aspirates are  unacceptable for Xpert Xpress SARS-CoV-2/FLU/RSV testing.  Fact Sheet for Patients: BloggerCourse.com  Fact Sheet for Healthcare  Providers: SeriousBroker.it  This test is not yet approved or cleared by the [place] and has been authorized for detection and/or diagnosis of SARS-CoV-2 by FDA under an Emergency Use Authorization (EUA). This EUA will remain in effect (meaning this test can be used) for the duration of the COVID-19 declaration under Section 564(b)(1) of the Act, 21 U.S.C. section 360bbb-3(b)(1), unless the authorization is terminated or revoked.     Resp Syncytial Virus by PCR NEGATIVE NEGATIVE Final    Comment: (NOTE) Fact Sheet for Patients: BloggerCourse.com  Fact Sheet for Healthcare Providers: SeriousBroker.it  This test is not yet approved or cleared by the [place] FDA and has been authorized for detection and/or diagnosis of SARS-CoV-2 by FDA under an Emergency Use Authorization (EUA). This EUA will remain in effect (meaning this test can be used) for the duration of the COVID-19 declaration under Section 564(b)(1) of the Act, 21 U.S.C. section 360bbb-3(b)(1), unless the authorization is terminated or revoked.  Performed at [facility], [address] [place], [place] [address]   Blood Culture (routine x 2)     Status: None (Preliminary result)   Collection Time: 11/22/22  1:41 AM   Specimen: BLOOD RIGHT HAND  Result Value Ref Range Status   Specimen Description BLOOD RIGHT HAND  Final   Special Requests   Final    BOTTLES DRAWN AEROBIC ONLY Blood Culture adequate volume   Culture   Final    NO GROWTH 3 DAYS Performed at [facility], [address] [place], [place] [address]    Report Status PENDING  Incomplete    Radiology Studies:  No results found.  Scheduled Meds:    atorvastatin  80 mg Oral Daily   baclofen   10 mg Oral TID   budesonide  0.25 mg Nebulization BID   Chlorhexidine Gluconate Cloth  6 each Topical Daily   clopidogrel  75 mg Oral Daily   enoxaparin (LOVENOX) injection  40 mg Subcutaneous Q24H   escitalopram  20 mg Oral q morning   ezetimibe  10 mg Oral Daily   gabapentin  100 mg Oral TID   insulin aspart  0-15 Units Subcutaneous TID WC   insulin aspart  0-5 Units Subcutaneous QHS   insulin aspart  3 Units Subcutaneous TID WC   insulin glargine-yfgn  15 Units Subcutaneous Daily   methimazole  5 mg Oral q morning   metoprolol tartrate  50 mg Oral BID   pantoprazole (PROTONIX) IV  40 mg Intravenous Q8H   Followed by   pantoprazole (PROTONIX) IV  40 mg Intravenous Q12H   ranolazine  500 mg Oral BID   sodium chloride flush  10-40 mL Intracatheter Q12H   tamsulosin  0.4 mg Oral QHS   topiramate  25 mg Oral BID    Continuous Infusions:     LOS: 3 days     Marcellus Scott, MD,  FACP, [facility], [facility], [facility]   [facility] [place]      To contact the attending provider between 7A-7P or the covering provider during after hours 7P-7A, please log into the web site [email or web address] and access using universal [place] password for that web site. If you do not have the password, please call the hospital operator.  11/25/2022, 12:57 PM

## 2022-11-26 DIAGNOSIS — E0590 Thyrotoxicosis, unspecified without thyrotoxic crisis or storm: Secondary | ICD-10-CM | POA: Diagnosis not present

## 2022-11-26 DIAGNOSIS — G9341 Metabolic encephalopathy: Secondary | ICD-10-CM | POA: Diagnosis not present

## 2022-11-26 DIAGNOSIS — R000 Tachycardia, unspecified: Secondary | ICD-10-CM | POA: Diagnosis not present

## 2022-11-26 DIAGNOSIS — R1310 Dysphagia, unspecified: Secondary | ICD-10-CM | POA: Diagnosis not present

## 2022-11-26 LAB — GLUCOSE, CAPILLARY
Glucose-Capillary: 156 mg/dL — ABNORMAL HIGH (ref 70–99)
Glucose-Capillary: 182 mg/dL — ABNORMAL HIGH (ref 70–99)
Glucose-Capillary: 200 mg/dL — ABNORMAL HIGH (ref 70–99)
Glucose-Capillary: 159 mg/dL — ABNORMAL HIGH (ref 70–99)

## 2022-11-26 LAB — CBC
HCT: 31 % — ABNORMAL LOW (ref 36.0–46.0)
Hemoglobin: 10.2 g/dL — ABNORMAL LOW (ref 12.0–15.0)
MCH: 26.7 pg (ref 26.0–34.0)
MCHC: 32.9 g/dL (ref 30.0–36.0)
MCV: 81.2 fL (ref 80.0–100.0)
Platelets: 367 10*3/uL (ref 150–400)
RBC: 3.82 MIL/uL — ABNORMAL LOW (ref 3.87–5.11)
RDW: 16 % — ABNORMAL HIGH (ref 11.5–15.5)
WBC: 8.2 10*3/uL (ref 4.0–10.5)
nRBC: 0 % (ref 0.0–0.2)

## 2022-11-26 LAB — T3, FREE: T3, Free: 2.4 pg/mL (ref 2.0–4.4)

## 2022-11-26 LAB — BASIC METABOLIC PANEL
Anion gap: 9 (ref 5–15)
BUN: 16 mg/dL (ref 6–20)
CO2: 22 mmol/L (ref 22–32)
Calcium: 8.8 mg/dL — ABNORMAL LOW (ref 8.9–10.3)
Chloride: 106 mmol/L (ref 98–111)
Creatinine, Ser: 1.03 mg/dL — ABNORMAL HIGH (ref 0.44–1.00)
GFR, Estimated: >60 mL/min (ref >60)
Glucose, Bld: 146 mg/dL — ABNORMAL HIGH (ref 70–99)
Potassium: 4.1 mmol/L (ref 3.5–5.1)
Sodium: 137 mmol/L (ref 135–145)

## 2022-11-26 MED ORDER — METHIMAZOLE 5 MG PO TABS
5.0000 mg | ORAL_TABLET | Freq: Two times a day (BID) | ORAL | Status: DC
  Administered 2022-11-26 – 2022-12-06 (×20): 5 mg via ORAL
  Filled 2022-11-26 (×21): qty 1

## 2022-11-26 MED ORDER — POLYETHYLENE GLYCOL 3350 17 G PO PACK
17.0000 g | PACK | Freq: Every day | ORAL | Status: DC
  Administered 2022-11-26 – 2022-12-06 (×8): 17 g via ORAL
  Filled 2022-11-26 (×10): qty 1

## 2022-11-26 MED ORDER — BISACODYL 5 MG PO TBEC: 5.0000 mg | DELAYED_RELEASE_TABLET | Freq: Every day | ORAL | Status: DC | PRN

## 2022-11-26 MED ORDER — SENNOSIDES-DOCUSATE SODIUM 8.6-50 MG PO TABS
2.0000 | ORAL_TABLET | Freq: Every day | ORAL | Status: DC
  Administered 2022-11-26 – 2022-12-05 (×8): 2 via ORAL
  Filled 2022-11-26 (×9): qty 2

## 2022-11-26 NOTE — Plan of Care (Signed)
  Problem: Coping: Goal: Ability to adjust to condition or change in health will improve Outcome: Progressing   

## 2022-11-26 NOTE — Progress Notes (Addendum)
PROGRESS NOTE   Lori [name]  NFA:[id number]    DOB: [birth year]    DOA: 11/21/2022  PCP: Caesar Bookman, NP   I have briefly reviewed patients previous medical records in [facility].  Chief Complaint  Patient presents with   Altered Mental Status    Brief Hospital Course:  52 year old female, medical history significant for CVA with residual left hemiparesis/spasticity/dysphagia, DM2/IDDM, CAD, chronic diastolic CHF, HTN, anxiety/depression, pheochromocytoma s/p unilateral adrenalectomy 2018, hospitalization 9/29 - 10/3 for acute metabolic encephalopathy, presented to [facility] ED on 10/17 with altered mental status, nausea and vomiting.  She was admitted for acute metabolic encephalopathy, AKI, intractable nausea and vomiting, hematemesis and tachycardia.  Postadmission on 10/18, became hypotensive with SBP in the 70s that recovered after IV fluid boluses.  Nausea, vomiting, dehydration and AKI resolved.  Mental status has improved but not clear what is her baseline.  Ongoing poor oral intake.  Sinus tachycardia suspected due to hyperthyroid.  Ongoing poor oral intake.  Also awaiting SNF.   Assessment & Plan:  Principal Problem:   Acute metabolic encephalopathy Active Problems:   Nausea & vomiting   AKI (acute kidney injury) (HCC)   History of CVA with spastic paresis (cerebrovascular accident)   Hyperthyroidism   Essential hypertension   Insulin dependent type 2 diabetes mellitus (HCC)   Left hemiplegia (HCC)   History of CAD (coronary artery disease)   Chronic heart failure with preserved ejection fraction (HFpEF) (HCC)   Chronic anticoagulation   Tachycardia   Anxiety and depression   Aphasia   Debility   Hyponatremia   Hypotension   Delirium   Dehydration   Acute metabolic encephalopathy/agitation:  Likely due to dehydration and polypharmacy.   No clear source of infection.   CT head and ABG without acute finding.   Encephalopathy seems to have resolved.   Might have some behavioral issues.  Baseline mental status is not clear and may need to elicit this from family.  Minimize opioids and sedative meds.  Delirium precautions.  Ongoing reorientation. Improving   Dehydration, nausea and vomiting:  Seems to have resolved.   CT chest, abdomen and pelvis without acute finding.  Abdominal exam benign.  Diet has been advanced to dysphagia 2 diet but per nursing, not really eating much.  This will be increased risk for recurrent dehydration. Only 10% intake at each meal has been documented.  Registered dietitian consulted for nutritional supplements.  If oral intake does not improve, then we will have to discuss with patient/spouse regarding alternate feeding options i.e. tube feeding.  Encouraged p.o. intake.   AKI:  Likely due to the above.   Baseline Cr ~0.9-1.0.   AKI resolved but remains at risk for recurrent AKI due to poor oral intake Push fluids p.o..  Patient appears to be drinking well.  Nutritional shakes may help.  Positive gastric Hemoccult:  Likely from Mallory-Weiss tear related to intractable nausea and vomiting, but cannot rule out gastritis or PUD.   Slight drop in Hgb likely dilutional from IV fluid.   Overall, H&H seems to be at baseline.  Nausea and vomiting seems to have resolved. Changed Protonix from IV to p.o. Home Plavix and Xarelto have been resumed.  Monitor closely.  Dysphagia:  Speech therapy has advance diet to dysphagia 2 diet and thin liquids Patient however needs to be fed and taking in very little per nursing at bedside.  Encourage p.o. intake. See above.   History of CVA with residual left hemiplegia/spasticity  and dysarthria:  CT head without acute finding. Continue home Plavix and statin. PT and OT eval evaluated and recommended SNF.  TOC on board.   History of CAD:  Serial troponin negative.  EKG without acute ischemic finding. Continue home Plavix, Ranexa and statin.   Hypotension/history of  essential hypertension:  Hypotension resolved.  Blood pressure elevated. Currently on metoprolol 50 Mg twice daily with good blood pressure control but has sinus tachycardia which has improved.   Sinus tachycardia/hyperthyroidism:  Clinically euvolemic.  Had sustained sinus tachycardia in the 120s-130s on 10/21.  Suspect related to underlying hyperthyroidism TFTs 10/21: TSH 1.306, free T32.4, free T41.48. Continue current metoprolol 50 Mg twice daily and may need to consider switching over to propranolol. Low suspicion for infection.  Increased methimazole from 5 mg daily to 10 mg twice daily.  Sinus tachycardia has resolved.  Sinus rhythm mostly in the 80s-90s.  Will reduce to 5 Mg twice daily to see if she does equally well with that. Outpatient follow-up with repeat TFTs in 3 to 4 weeks.  Also need to closely follow LFTs and CBCs as outpatient. Recommend outpatient endocrinology consultation or follow-up.  Do not think or know if she actually sees one currently.  Had seen an endocrinologist in 2019 but that was for her DM and pheochromocytoma follow-up.   Uncontrolled IDDM-2 with hyperglycemia:  A1c 8.5 on 10/20 Currently on Semglee 15 units daily, mealtime NovoLog 3 units 3 times daily and moderate SSI.  These are much lower dose insulins than her home dose, will uptitrate. 2 hypoglycemic episodes last evening in the mid 50s.  Suspect due to poor oral intake.  Discontinue mealtime NovoLog.   History of diastolic CHF:  Appears euvolemic overall.   Anxiety and depression:  - Stable -Continue home meds.   Lactic acidosis:  Likely due to dehydration.  Infectious workup unrevealing.  Resolved.  Antibiotics discontinued   Hypokalemia/hypomagnesemia/hypophosphatemia -Monitor replenish as appropriate  Pheochromocytoma, s/p right-sided unilateral adrenalectomy:  Chronic anticoagulation for DVT 2023 PTA was on prophylactic dose anticoagulation Xarelto 10 Mg daily, confirmed by pharmacy on  home med rec, resumed.  Monitor closely for bleeding.  Duration of anticoagulation not clear,?  Lifelong.  Also on Plavix for CVA.  Obviously increases bleeding risk.  This needs to be closely evaluated as outpatient by her PCP.   Body mass index is 35.6 kg/m./Obesity   DVT prophylaxis: rivaroxaban (XARELTO) tablet 10 mg Start: 11/26/22 1000 SCDs Start: 11/22/22 0523     Code Status: Full Code:  Family Communication: Discussed extensively with patient's spouse via phone on 10/21.  He visited patient last night and stated that her mental status was much better than when she came in but not yet at baseline.  When she had come in, she could not recognize him, had hallucinations but last night was able to recognize him and speak with him.  Advised him regarding the very poor oral intake and that he encourage or feed her when she comes in later today.  He confirmed that she does not follow-up with an endocrinologist and was advised to do that upon discharge. Disposition:  Remains inpatient due to ongoing poor oral intake which needs to improve for DC to next level of care.  If oral intake remains poor, may have to coordinate with patient/family regarding alternate means of nutrition.  May need goals of care discussions by palliative care team.     Consultants:   PCCM  Procedures:     Antimicrobials:  Subjective:  Patient sitting up at edge of bed with PT support and working with PT.  Discussed with patient regarding ongoing poor oral intake and encouraged increased food intake.  She states that she has been drinking fluids.  Denied any other complaints.  Objective:   Vitals:   11/26/22 0348 11/26/22 0500 11/26/22 0816 11/26/22 1052  BP: 102/77  (!) 137/94 93/60  Pulse: 98  (!) 102 85  Resp: 18  16 17   Temp: 97.8 F (36.6 C)  98.4 F (36.9 C) 98.9 F (37.2 C)  TempSrc: Oral  Oral Oral  SpO2: 100%  100% 99%  Weight:  88.3 kg    Height:        General exam: Middle-age female,  moderately built and obese sitting up at edge of bed with PT help.  Did not appear in any distress. Respiratory system: Clear to auscultation. Respiratory effort normal. Cardiovascular system: S1 & S2 heard, RRR or. No JVD, murmurs, rubs, gallops or clicks. No pedal edema.  Telemetry personally reviewed: Sinus rhythm in the 90s-80s.  Improved compared to day prior Gastrointestinal system: Abdomen is nondistended, soft and nontender. No organomegaly or masses felt. Normal bowel sounds heard. Central nervous system: Alert and oriented x 2.  Now that she is sitting up, did appreciate some facial asymmetry?  Chronic, also some dysarthria which may be chronic. Extremities: Left upper extremity with contractures and grade 2 x 5 power.  Both lower extremities at least grade 3 x 5 power.  Right upper extremity grade 5 x 5 power. Skin: No rashes, lesions or ulcers Psychiatry: Judgement and insight impaired. Mood & affect flat.     Data Reviewed:   I have personally reviewed following labs and imaging studies   CBC: Recent Labs  Lab 11/22/22 0019 11/22/22 0920 11/24/22 0820 11/24/22 1810 11/25/22 0520 11/26/22 0508  WBC 7.3   < > 9.4  --  9.2 8.2  NEUTROABS 5.4  --   --   --   --   --   HGB 12.6   < > 11.5* 11.0* 11.0* 10.2*  HCT 38.6   < > 34.0* 33.6* 33.5* 31.0*  MCV 81.6   < > 78.0*  --  79.0* 81.2  PLT 386   < > 385  --  392 367   < > = values in this interval not displayed.    Basic Metabolic Panel: Recent Labs  Lab 11/22/22 0920 11/23/22 0739 11/24/22 0820 11/25/22 0520 11/26/22 0508  NA 137 139 135 139 137  K 4.0 3.4* 3.6 4.3 4.1  CL 102 108 104 106 106  CO2 20* 20* 22 22 22   GLUCOSE 379* 175* 240* 185* 146*  BUN 23* 14 8 14 16   CREATININE 1.53* 0.89 0.77 1.16* 1.03*  CALCIUM 8.7* 8.8* 8.9 9.2 8.8*  MG 1.7 1.6* 2.0 2.1  --   PHOS 3.1 1.7* 3.3 3.4  --     Liver Function Tests: Recent Labs  Lab 11/22/22 0019 11/22/22 0920 11/23/22 0739 11/24/22 0820  11/25/22 0520  AST 28  --  14* 14*  --   ALT 16  --  14 14  --   ALKPHOS 78  --  58 74  --   BILITOT 1.2  --  0.5 0.7  --   PROT 7.3  --  6.8 7.3  --   ALBUMIN 3.7 3.3* 3.4* 3.4* 3.4*    CBG: Recent Labs  Lab 11/25/22 2131 11/26/22 0610 11/26/22 1052  GLUCAP  94 156* 182*    Microbiology Studies:   Recent Results (from the past 240 hour(s))  Blood Culture (routine x 2)     Status: None (Preliminary result)   Collection Time: 11/22/22 12:19 AM   Specimen: BLOOD RIGHT ARM  Result Value Ref Range Status   Specimen Description BLOOD RIGHT ARM  Final   Special Requests   Final    BOTTLES DRAWN AEROBIC AND ANAEROBIC Blood Culture adequate volume   Culture   Final    NO GROWTH 4 DAYS Performed at [facility], [address], [place], [place] [address]    Report Status PENDING  Incomplete  Resp panel by RT-PCR (RSV, Flu A&B, Covid) Anterior Nasal Swab     Status: None   Collection Time: 11/22/22 12:19 AM   Specimen: Anterior Nasal Swab  Result Value Ref Range Status   SARS Coronavirus 2 by RT PCR NEGATIVE NEGATIVE Final   Influenza A by PCR NEGATIVE NEGATIVE Final   Influenza B by PCR NEGATIVE NEGATIVE Final    Comment: (NOTE) The Xpert Xpress SARS-CoV-2/FLU/RSV plus assay is intended as an aid in the diagnosis of influenza from Nasopharyngeal swab specimens and should not be used as a sole basis for treatment. Nasal washings and aspirates are unacceptable for Xpert Xpress SARS-CoV-2/FLU/RSV testing.  Fact Sheet for Patients: BloggerCourse.com  Fact Sheet for Healthcare Providers: SeriousBroker.it  This test is not yet approved or cleared by the [place] FDA and has been authorized for detection and/or diagnosis of SARS-CoV-2 by FDA under an Emergency Use Authorization (EUA). This EUA will remain in effect (meaning this test can be used) for the duration of the COVID-19 declaration under Section 564(b)(1) of  the Act, 21 U.S.C. section 360bbb-3(b)(1), unless the authorization is terminated or revoked.     Resp Syncytial Virus by PCR NEGATIVE NEGATIVE Final    Comment: (NOTE) Fact Sheet for Patients: BloggerCourse.com  Fact Sheet for Healthcare Providers: SeriousBroker.it  This test is not yet approved or cleared by the [place] FDA and has been authorized for detection and/or diagnosis of SARS-CoV-2 by FDA under an Emergency Use Authorization (EUA). This EUA will remain in effect (meaning this test can be used) for the duration of the COVID-19 declaration under Section 564(b)(1) of the Act, 21 U.S.C. section 360bbb-3(b)(1), unless the authorization is terminated or revoked.  Performed at [facility], [address], [place], [place] [address]   Blood Culture (routine x 2)     Status: None (Preliminary result)   Collection Time: 11/22/22  1:41 AM   Specimen: BLOOD RIGHT HAND  Result Value Ref Range Status   Specimen Description BLOOD RIGHT HAND  Final   Special Requests   Final    BOTTLES DRAWN AEROBIC ONLY Blood Culture adequate volume   Culture   Final    NO GROWTH 4 DAYS Performed at [facility], [address], [place], [place] [address]    Report Status PENDING  Incomplete    Radiology Studies:  No results found.  Scheduled Meds:    atorvastatin  80 mg Oral Daily   baclofen  10 mg Oral TID   budesonide  0.25 mg Nebulization BID   Chlorhexidine Gluconate Cloth  6 each Topical Daily   clopidogrel  75 mg Oral Daily   escitalopram  20 mg Oral q morning   ezetimibe  10 mg Oral Daily   gabapentin  100 mg Oral TID   insulin aspart  0-15  Units Subcutaneous TID WC   insulin aspart  0-5 Units Subcutaneous QHS   insulin glargine-yfgn  15 Units Subcutaneous Daily   methimazole  10 mg Oral BID   metoprolol tartrate  50 mg Oral BID   pantoprazole  40 mg Oral BID AC   ranolazine  500 mg Oral BID    rivaroxaban  10 mg Oral Daily   sodium chloride flush  10-40 mL Intracatheter Q12H   tamsulosin  0.4 mg Oral QHS   topiramate  25 mg Oral BID    Continuous Infusions:     LOS: 4 days     Marcellus Scott, MD,  FACP, [facility], [facility], [facility]   [facility] [place]      To contact the attending provider between 7A-7P or the covering provider during after hours 7P-7A, please log into the web site [email or web address] and access using universal  password for that web site. If you do not have the password, please call the hospital operator.  11/26/2022, 2:50 PM

## 2022-11-26 NOTE — TOC Progression Note (Signed)
Transition of Care [facility]) - Progression Note    Patient Details  Name: Lori [name] MRN: [id number] Date of Birth: 1970/11/24  Transition of Care [facility]) CM/SW Contact  Baldemar [place], [place] Phone Number: 11/26/2022, 3:56 PM  Clinical Narrative:   CSW attempted to speak with spouse today about bed offers, didn't see spouse at bedside today. CSW called and left a voicemail requesting call back. CSW to follow.    Expected Discharge Plan: Skilled Nursing Facility Barriers to Discharge: Continued Medical Work up, English as a second language teacher  Expected Discharge Plan and Services     Post Acute Care Choice: Skilled Nursing Facility Living arrangements for the past 2 months: Single Family Home                                       Social Determinants of Health (SDOH) Interventions SDOH Screenings   Food Insecurity: No Food Insecurity (11/11/2022)  Housing: Patient Declined (11/04/2022)  Transportation Needs: No Transportation Needs (11/11/2022)  Utilities: Not At Risk (11/04/2022)  Depression (PHQ2-9): Low Risk  (11/01/2022)  Recent Concern: Depression (PHQ2-9) - Medium Risk (10/28/2022)  Tobacco Use: Low Risk  (11/04/2022)    Readmission Risk Interventions    11/05/2022    1:38 PM 10/15/2022   10:25 AM 08/29/2021    2:32 PM  Readmission Risk Prevention Plan  Transportation Screening Complete Complete Complete  Medication Review Oceanographer) Complete  Complete  PCP or Specialist appointment within 3-5 days of discharge Complete Complete Complete  HRI or Home Care Consult Complete Complete Complete  SW Recovery Care/Counseling Consult Complete Complete Complete  Palliative Care Screening Not Applicable Not Applicable Not Applicable  Skilled Nursing Facility Not Applicable Not Applicable Complete

## 2022-11-26 NOTE — Plan of Care (Signed)
  Problem: Fluid Volume: Goal: Hemodynamic stability will improve Outcome: Progressing   Problem: Respiratory: Goal: Ability to maintain adequate ventilation will improve Outcome: Progressing   Problem: Skin Integrity: Goal: Risk for impaired skin integrity will decrease Outcome: Progressing   Problem: Activity: Goal: Risk for activity intolerance will decrease Outcome: Progressing   Problem: Safety: Goal: Ability to remain free from injury will improve Outcome: Progressing

## 2022-11-26 NOTE — Progress Notes (Signed)
Physical Therapy Treatment Patient Details Name: Lori [name] MRN: [id number] DOB: 1970-09-23 Today's Date: 11/26/2022   History of Present Illness 52 y.o. female presented to [facility] ED with behavioral change, increased somnolence and nausea and vomiting. Dx Dehydration with AKI. PMH of CVA with left hemiparesis/spasticity/aphasia, IDDM-2, CAD, diastolic CHF, HTN, hyperthyroidism, anxiety and depression presenting with behavioral change, increased somnolence and nausea and vomiting.    PT Comments  Pt is received in bed, she is tired but agreeable to PT session. Pt performs bed mobility modA x2-maxA; supine>sit modA-maxA and sit>supine modA x2. Unable to assess further mobility at this time secondary to reports of back discomfort and generalized weakness. Pt able to maintain seated balance and perform seated exercises to promote RUE strength and ROM, and BLE strength. Additionally, Pt able to perform semi-reclined core exercises to focus on core strength and activation to promote improved seated posture. Pt endorses mild back pain during seated activity and requested to lay back down. Overall, Pt demonstrates steady improvements towards PT goals and would benefit from cont skilled PT to address above deficits and promote optimal return to PLOF.   If plan is discharge home, recommend the following: Assist for transportation;Assistance with feeding;Assistance with cooking/housework;Help with stairs or ramp for entrance;A lot of help with bathing/dressing/bathroom;Two people to help with walking and/or transfers;Supervision due to cognitive status   Can travel by private vehicle     No  Equipment Recommendations  None recommended by PT    Recommendations for Other Services       Precautions / Restrictions Precautions Precautions: Fall Precaution Comments: chronic L side weakness 2* CVA Restrictions Weight Bearing Restrictions: No Other Position/Activity Restrictions: L sided weakness      Mobility  Bed Mobility Overal bed mobility: Needs Assistance Bed Mobility: Supine to Sit, Sit to Supine     Supine to sit: Mod assist, Max assist, HOB elevated, Used rails Sit to supine: Mod assist, +2 for safety/equipment   General bed mobility comments: Pt able to perform supine>sit mod-maxA for LLE management and trunk control but able to use RLE to help facilitate LLE movement; sit>supine modA x2 due to increase back discomfort    Transfers                   General transfer comment: Deferred at this time due to reports of back pain    Ambulation/Gait               General Gait Details: Deferred at this time due to reports of back pain   Stairs             Wheelchair Mobility     Tilt Bed    Modified Rankin (Stroke Patients Only)       Balance Overall balance assessment: Needs assistance Sitting-balance support: Single extremity supported Sitting balance-Leahy Scale: Poor Sitting balance - Comments: heavy R side lean, slump sitting balance; able to maintain seated EOB balance with 1 UE support during functional activities Postural control: Posterior lean, Right lateral lean   Standing balance-Leahy Scale: Zero Standing balance comment: Unable to assess in this session                            Cognition Arousal: Alert Behavior During Therapy: Flat affect Overall Cognitive Status: No family/caregiver present to determine baseline cognitive functioning  General Comments: Pleasant and cooperative with PT session; answers better to dichotomous questions with slight confusion at beginning of session but improved throughout        Exercises Other Exercises Other Exercises: x10ea seated LAQ, x10 seated dynamic reach RUE, x10 semi-reclined R/L cross body reach, x10 semi-reclined BUE forward reach    General Comments        Pertinent Vitals/Pain Pain Assessment Pain Assessment:  Faces Faces Pain Scale: Hurts a little bit Pain Location: back Pain Descriptors / Indicators: Guarding, Discomfort Pain Intervention(s): Limited activity within patient's tolerance, Repositioned    Home Living                          Prior Function            PT Goals (current goals can now be found in the care plan section) Acute Rehab PT Goals Patient Stated Goal: Get stronger before going home. PT Goal Formulation: Patient unable to participate in goal setting Time For Goal Achievement: 12/06/22 Potential to Achieve Goals: Fair Progress towards PT goals: Progressing toward goals    Frequency    Min 1X/week      PT Plan      Co-evaluation              AM-PAC PT "6 Clicks" Mobility   Outcome Measure  Help needed turning from your back to your side while in a flat bed without using bedrails?: A Lot Help needed moving from lying on your back to sitting on the side of a flat bed without using bedrails?: A Lot Help needed moving to and from a bed to a chair (including a wheelchair)?: A Lot Help needed standing up from a chair using your arms (e.g., wheelchair or bedside chair)?: A Lot Help needed to walk in hospital room?: Total Help needed climbing 3-5 steps with a railing? : Total 6 Click Score: 10    End of Session   Activity Tolerance: Other (comment) (weakness) Patient left: in bed;with call bell/phone within reach;with bed alarm set Nurse Communication: Mobility status PT Visit Diagnosis: Hemiplegia and hemiparesis;Muscle weakness (generalized) (M62.81);History of falling (Z91.81);Difficulty in walking, not elsewhere classified (R26.2);Other symptoms and signs involving the nervous system (R29.898) Hemiplegia - Right/Left: Left Hemiplegia - dominant/non-dominant: Non-dominant Hemiplegia - caused by:  (Prior CVAs)     Time: 6045-4098 PT Time Calculation (min) (ACUTE ONLY): 23 min  Charges:    $Therapeutic Exercise: 23-37 mins PT General  Charges $$ ACUTE PT VISIT: 1 Visit                     Revonda Menter, PT, GCS 11/26/22,11:58 AM

## 2022-11-27 ENCOUNTER — Encounter (HOSPITAL_COMMUNITY): Payer: Self-pay | Admitting: Internal Medicine

## 2022-11-27 ENCOUNTER — Other Ambulatory Visit: Payer: Self-pay

## 2022-11-27 DIAGNOSIS — G9341 Metabolic encephalopathy: Secondary | ICD-10-CM | POA: Diagnosis not present

## 2022-11-27 LAB — BASIC METABOLIC PANEL
Anion gap: 9 (ref 5–15)
BUN: 18 mg/dL (ref 6–20)
CO2: 23 mmol/L (ref 22–32)
Calcium: 8.9 mg/dL (ref 8.9–10.3)
Chloride: 105 mmol/L (ref 98–111)
Creatinine, Ser: 1.05 mg/dL — ABNORMAL HIGH (ref 0.44–1.00)
GFR, Estimated: >60 mL/min (ref >60)
Glucose, Bld: 134 mg/dL — ABNORMAL HIGH (ref 70–99)
Potassium: 3.9 mmol/L (ref 3.5–5.1)
Sodium: 137 mmol/L (ref 135–145)

## 2022-11-27 LAB — GLUCOSE, CAPILLARY
Glucose-Capillary: 126 mg/dL — ABNORMAL HIGH (ref 70–99)
Glucose-Capillary: 158 mg/dL — ABNORMAL HIGH (ref 70–99)
Glucose-Capillary: 239 mg/dL — ABNORMAL HIGH (ref 70–99)
Glucose-Capillary: 167 mg/dL — ABNORMAL HIGH (ref 70–99)

## 2022-11-27 LAB — CULTURE, BLOOD (ROUTINE X 2)
Culture: NO GROWTH
Culture: NO GROWTH
Special Requests: ADEQUATE
Special Requests: ADEQUATE

## 2022-11-27 LAB — CBC
HCT: 29.7 % — ABNORMAL LOW (ref 36.0–46.0)
Hemoglobin: 9.7 g/dL — ABNORMAL LOW (ref 12.0–15.0)
MCH: 26.3 pg (ref 26.0–34.0)
MCHC: 32.7 g/dL (ref 30.0–36.0)
MCV: 80.5 fL (ref 80.0–100.0)
Platelets: 377 10*3/uL (ref 150–400)
RBC: 3.69 MIL/uL — ABNORMAL LOW (ref 3.87–5.11)
RDW: 15.9 % — ABNORMAL HIGH (ref 11.5–15.5)
WBC: 8.4 10*3/uL (ref 4.0–10.5)
nRBC: 0 % (ref 0.0–0.2)

## 2022-11-27 MED ORDER — ENSURE ENLIVE PO LIQD: 237.0000 mL | Freq: Two times a day (BID) | ORAL | Status: DC

## 2022-11-27 MED ORDER — ENSURE ENLIVE PO LIQD
237.0000 mL | Freq: Two times a day (BID) | ORAL | Status: DC
  Administered 2022-11-28 – 2022-12-04 (×13): 237 mL via ORAL

## 2022-11-27 NOTE — Progress Notes (Signed)
Initial Nutrition Assessment  DOCUMENTATION CODES:   Obesity unspecified  INTERVENTION:  -Dysphagia II diet per SLP recommendation. Add extra gravy all meals.  -Supplement inadequate meal intakes with Ensure Enlive po BID, each supplement provides 350 kcal and 20 grams of protein.  -May require more aggressive bowel regimen, no recorded BM past seven days.   NUTRITION DIAGNOSIS:   Inadequate oral intake related to dysphagia, other (see comment) (dyslike of food texture) as evidenced by meal completion < 50%.    GOAL:   Patient will meet greater than or equal to 90% of their needs    MONITOR:   PO intake, Labs, Supplement acceptance, Weight trends, Skin  REASON FOR ASSESSMENT:   Consult Assessment of nutrition requirement/status  ASSESSMENT: 52 y/o female presented with behavior change, increased somnolence, nausea and vomiting.  PMH: CVA with left hemiparesis/spasticity/aphasia, IDDM-2, CAD, diastolic CHF, HTN, hyperthyroidism, anxiety, depression, pheochromocytoma s/p unilateral adrenalectomy 2018.   Nausea, vomiting resolved.  Poor PO intakes. Per review of EMR weight hx, weight fluctuations noted between 83.9-97.2 kg over the past 12 months which may represent fluid in patient with hx of CHF.  Edema 1+ generalized, non-pitting RUE and LUE, RLE and LLE.  Intakes recorded 10-25% x 4 meals.  Nutrition hx per CG at bedside.  Patient usually consumes two meals daily, B includes eggs, cereal, banana; D meal two vegetables-usually greens, spinach, baked chicken. Denies added salt use, otherwise no other dietary restrictions.  Patient was noted to be eating well prior to admission. She dislikes the food textures-refers to it as "crumbly". Complaint of meals being "too dry", would like to have extra gravy. She is receiving meals from outside, reinforced with CG importance of following altered textures for safety. Patient is open to having Ensure in vanilla flavor only.    Most  recent BM was last Thursday, patient denies nausea, although notes constipation.  Medications reviewed and include novolog SS, glargine-yfgn 15 units daily, PPI, miralax, senna-docusate.  Labs: CBG 126-200, hgb A1C 8.5%    NUTRITION - FOCUSED PHYSICAL EXAM:  Flowsheet Row Most Recent Value  Orbital Region No depletion  Upper Arm Region No depletion  Thoracic and Lumbar Region No depletion  Buccal Region No depletion  Temple Region No depletion  Clavicle Bone Region No depletion  Clavicle and Acromion Bone Region No depletion  Scapular Bone Region No depletion  Dorsal Hand No depletion  Patellar Region No depletion  Anterior Thigh Region No depletion  Posterior Calf Region No depletion  Edema (RD Assessment) Mild  Hair Reviewed  Eyes Reviewed  Mouth Reviewed  Skin Reviewed  Nails Reviewed       Diet Order:   Diet Order             DIET DYS 2 Room service appropriate? Yes with Assist; Fluid consistency: Thin  Diet effective now                   EDUCATION NEEDS:   No education needs have been identified at this time  Skin:  Skin Assessment: Skin Integrity Issues: Skin Integrity Issues:: Other (Comment) Other: moisture related skin damage left and right groin  Last BM:  no recorded BM this admit, constipation  Height:   Ht Readings from Last 1 Encounters:  11/22/22 5\' 2"  (1.575 m)    Weight:   Wt Readings from Last 1 Encounters:  11/26/22 88.3 kg    Ideal Body Weight:  52.3 kg  BMI:  Body mass index is 35.6  kg/m.  Estimated Nutritional Needs:   Kcal:  1350-1550  Protein:  63-78  Fluid:  1550    Alvino Chapel, RDLD Clinical Dietitian See AMION for contact information

## 2022-11-27 NOTE — Progress Notes (Addendum)
Occupational Therapy Treatment Patient Details Name: Lori [name] MRN: [id number] DOB: 1970/03/01 Today's Date: 11/27/2022   History of present illness 52 y.o. female presented to [facility] ED with behavioral change, increased somnolence and nausea and vomiting. Dx Dehydration with AKI. PMH of CVA with left hemiparesis/spasticity/aphasia, IDDM-2, CAD, diastolic CHF, HTN, hyperthyroidism, anxiety and depression presenting with behavioral change, increased somnolence and nausea and vomiting.   OT comments  Pt progressing towards goals, more alert and communicative this session. Pt needing max A +2 for transfers and mod A for bed mobility this session. Tolerates limited AROM/AAROM to LUE once seated up in chair. Pt's caregiver present throughout session and states HH was supposed to bring a splint for LUE but it has "been 2 months and they haven't brought one", discussed with MD via securechat and resting hand splint ordered 10/23, plan to establish wear schedule and adjust/fit splint in next session. Pt presenting with impairments listed below, will follow acutely. Patient will benefit from continued inpatient follow up therapy, <3 hours/day to maximize safety/ind with ADLs/functional mobility.       If plan is discharge home, recommend the following:  Two people to help with walking and/or transfers;A lot of help with bathing/dressing/bathroom;Assist for transportation;Help with stairs or ramp for entrance;Supervision due to cognitive status   Equipment Recommendations  Other (comment) (TBD)    Recommendations for Other Services PT consult;Speech consult    Precautions / Restrictions Precautions Precautions: Fall Precaution Comments: chronic L side weakness 2* CVA Restrictions Weight Bearing Restrictions: No Other Position/Activity Restrictions: L sided weakness       Mobility Bed Mobility Overal bed mobility: Needs Assistance Bed Mobility: Supine to Sit     Supine to sit: Mod  assist     General bed mobility comments: pt initiating moving legs to EOB, assist to bring legs fully to EOB and to elevate trunk    Transfers Overall transfer level: Needs assistance Equipment used: 2 person hand held assist Transfers: Bed to chair/wheelchair/BSC, Sit to/from Stand Sit to Stand: Mod assist, +2 physical assistance Stand pivot transfers: Max assist, +2 physical assistance               Balance Overall balance assessment: Needs assistance Sitting-balance support: Bilateral upper extremity supported Sitting balance-Leahy Scale: Fair Sitting balance - Comments: static sitting EOB   Standing balance support: During functional activity Standing balance-Leahy Scale: Zero Standing balance comment: Unable to assess in this session                           ADL either performed or assessed with clinical judgement   ADL                           Toilet Transfer: Maximal assistance;+2 for physical assistance Toilet Transfer Details (indicate cue type and reason): simulated to chair         Functional mobility during ADLs: Maximal assistance;+2 for physical assistance      Extremity/Trunk Assessment Upper Extremity Assessment Upper Extremity Assessment: Right hand dominant LUE Deficits / Details: minimal movement for digit flex/ext in digits 1-3, MCPs in flexed position, ~10* movment wrist flex/ext, can extend elbow actively with incr time LUE Coordination: decreased fine motor;decreased gross motor   Lower Extremity Assessment Lower Extremity Assessment: Defer to PT evaluation   Cervical / Trunk Assessment Cervical / Trunk Assessment: Kyphotic    Vision   Additional Comments: will further  assess   Perception Perception Perception: Not tested   Praxis Praxis Praxis: Not tested    Cognition Arousal: Alert Behavior During Therapy: Flat affect Overall Cognitive Status: No family/caregiver present to determine baseline  cognitive functioning                                 General Comments: follows single step commands with incr time        Exercises Exercises: General Upper Extremity General Exercises - Upper Extremity Elbow Flexion: AAROM, Left, 5 reps, Seated Elbow Extension: AAROM, Left, 5 reps, Seated Wrist Flexion: AAROM, Left, 5 reps, Seated Wrist Extension: AAROM, Left, 5 reps, Seated Digit Composite Flexion: AROM, Left, 5 reps, Seated Composite Extension: AAROM, Left, 5 reps, Seated    Shoulder Instructions       General Comments VSS    Pertinent Vitals/ Pain       Pain Assessment Pain Assessment: Faces Pain Score: 2  Faces Pain Scale: Hurts a little bit Pain Location: back Pain Descriptors / Indicators: Guarding, Discomfort Pain Intervention(s): Limited activity within patient's tolerance  Home Living Family/patient expects to be discharged to:: Skilled nursing facility                                        Prior Functioning/Environment              Frequency  Min 1X/week        Progress Toward Goals  OT Goals(current goals can now be found in the care plan section)     Acute Rehab OT Goals Patient Stated Goal: none stated OT Goal Formulation: With patient Time For Goal Achievement: 12/08/22 Potential to Achieve Goals: Fair  Plan      Co-evaluation                 AM-PAC OT "6 Clicks" Daily Activity     Outcome Measure   Help from another person eating meals?: A Lot Help from another person taking care of personal grooming?: A Lot Help from another person toileting, which includes using toliet, bedpan, or urinal?: A Lot Help from another person bathing (including washing, rinsing, drying)?: A Lot Help from another person to put on and taking off regular upper body clothing?: A Lot Help from another person to put on and taking off regular lower body clothing?: A Lot 6 Click Score: 12    End of Session  Equipment Utilized During Treatment: Gait belt  OT Visit Diagnosis: Other abnormalities of gait and mobility (R26.89);Muscle weakness (generalized) (M62.81);Other symptoms and signs involving the nervous system (R29.898)   Activity Tolerance Patient tolerated treatment well   Patient Left in chair;with call bell/phone within reach;with chair alarm set   Nurse Communication Mobility status        Time: 1430-1456 OT Time Calculation (min): 26 min  Charges: OT General Charges $OT Visit: 1 Visit OT Treatments $Therapeutic Activity: 23-37 mins  Carver Fila, OTD, OTR/L SecureChat Preferred Acute Rehab [phone]   Estelita Iten K Koonce 11/27/2022, 3:06 PM

## 2022-11-27 NOTE — Plan of Care (Signed)

## 2022-11-27 NOTE — Progress Notes (Signed)
PROGRESS NOTE    Lori [name]  WUJ:[id number] DOB: 1970-04-01 DOA: 11/21/2022 PCP: Caesar Bookman, NP   Brief Narrative: This 52 year old female, medical history significant for CVA with residual left hemiparesis/spasticity/dysphagia, DM2/IDDM, CAD, chronic diastolic CHF, HTN, anxiety/depression, pheochromocytoma s/p unilateral adrenalectomy in 2018, hospitalization 9/29 - 10/3 for acute metabolic encephalopathy, presented to [facility] ED on 10/17 with altered mental status, nausea and vomiting. She was admitted for acute metabolic encephalopathy, AKI, intractable nausea and vomiting, hematemesis and tachycardia.  Postadmission on 10/18, became hypotensive with SBP in the 70s that recovered after IV fluid boluses.  Nausea, vomiting, dehydration and AKI resolved.  Mental status has improved but not clear what is her baseline.  Ongoing poor oral intake.  Sinus tachycardia suspected due to hyperthyroid.  Ongoing poor oral intake.  Also awaiting SNF.   Assessment & Plan:   Principal Problem:   Acute metabolic encephalopathy Active Problems:   Nausea & vomiting   AKI (acute kidney injury) (HCC)   History of CVA with spastic paresis (cerebrovascular accident)   Hyperthyroidism   Essential hypertension   Insulin dependent type 2 diabetes mellitus (HCC)   Left hemiplegia (HCC)   History of CAD (coronary artery disease)   Chronic heart failure with preserved ejection fraction (HFpEF) (HCC)   Chronic anticoagulation   Tachycardia   Anxiety and depression   Aphasia   Debility   Hyponatremia   Hypotension   Delirium   Dehydration   Acute metabolic encephalopathy/agitation:  Likely due to dehydration and polypharmacy.   No clear source of infection.   CT head and ABG without acute finding.   Encephalopathy seems to have resolved.  Might have some behavioral issues.   Baseline mental status is not clear and may need to elicit this from family.  Minimize opioids and sedative meds.   Delirium precautions.  Ongoing reorientation. Mental status Improving   Dehydration, nausea and vomiting:  Now resolved. CT chest, abdomen and pelvis without acute findings.  Abdominal exam benign.  Diet has been advanced to dysphagia 2 diet but per nursing, not really eating much.   This will be increased risk for recurrent dehydration.Only 10% intake at each meal has been documented.   Registered dietitian consulted for nutritional supplements.   If oral intake does not improve, then we will have to discuss with patient/spouse regarding alternate feeding options i.e. tube feeding.   Encouraged p.o. intake.   Acute Kidney Injury:  Likely due to the above.   Baseline Cr ~0.9-1.0.   AKI resolved but remains at risk for recurrent AKI due to poor oral intake. Push fluids orally.  Patient appears to be drinking well.  Nutritional shakes may help.   Positive gastric Hemoccult: Likely from Mallory-Weiss tear related to intractable nausea and vomiting, but cannot rule out gastritis or PUD.   Slight drop in Hgb likely dilutional from IV fluids.   Overall, H&H seems to be at baseline.  Nausea and vomiting seems to have resolved. Changed Protonix from IV to Oral Home Plavix and Xarelto have been resumed.  Monitor closely.   Dysphagia:  Speech therapy has advance diet to dysphagia 2 diet and thin liquids Patient however needs to be fed and taking in very little per nursing at bedside.  Encourage p.o. intake.   History of CVA with residual left hemiplegia/spasticity and dysarthria:  CT head without acute findings. Continue home Plavix and statin. PT and OT eval evaluated and recommended SNF.  TOC on board.   History of CAD:  Serial troponin negative.  EKG without acute ischemic finding. Continue home Plavix, Ranexa and statin.   Hypotension/history of essential hypertension:  Hypotension resolved.  Blood pressure elevated. Currently on metoprolol 50 Mg twice daily with good blood pressure  control but has sinus tachycardia which has improved.   Sinus tachycardia/ Hyperthyroidism:  Clinically euvolemic.  Had sustained sinus tachycardia in the 120s-130s on 10/21.  Suspect related to underlying hyperthyroidism TFTs 10/21: TSH 1.306, free T32.4, free T41.48. Continue current metoprolol 50 Mg twice daily and may need to consider switching over to propranolol. Low suspicion for infection.  Increased methimazole from 5 mg daily to 10 mg twice daily.  Sinus tachycardia has resolved.   Sinus rhythm mostly in the 80s-90s.  Will reduce to 5 Mg twice daily to see if she does equally well with that. Outpatient follow-up with repeat TFTs in 3 to 4 weeks.  Also need to closely follow LFTs and CBCs as outpatient. Recommend outpatient endocrinology consultation or follow-up.  Do not think or know if she actually sees one currently.   Had seen an endocrinologist in 2019 but that was for her DM and pheochromocytoma follow-up.   Uncontrolled IDDM-2 with hyperglycemia:  HbA1c 8.5 on 10/20 Currently on Semglee 15 units daily, mealtime NovoLog 3 units 3 times daily and moderate SSI.   These are much lower dose insulins than her home dose, will uptitrate. 2 hypoglycemic episodes last evening in the mid 50s.  Suspect due to poor oral intake.  Discontinue mealtime NovoLog.   History of diastolic CHF:  Appears euvolemic overall.   Anxiety and depression:  - Stable -Continue home meds.   Lactic acidosis:  Likely due to dehydration.  Infectious workup unrevealing.  Resolved.  Antibiotics discontinued   Hypokalemia/hypomagnesemia/hypophosphatemia -Monitor replenish as appropriate   Pheochromocytoma, s/p right-sided unilateral adrenalectomy:   Chronic anticoagulation for DVT 2023 PTA was on prophylactic dose anticoagulation Xarelto 10 Mg daily, confirmed by pharmacy on home med rec, resumed.   Monitor closely for bleeding.  Duration of anticoagulation not clear,?  Lifelong.  Also on Plavix for  CVA.  Obviously increases bleeding risk.   This needs to be closely evaluated as outpatient by her PCP.   Body mass index is 35.6 kg/m./Obesity   DVT prophylaxis: Xarelto Code Status: Full code Family Communication: No family at bed side. Disposition Plan:     Status is: Inpatient Remains inpatient appropriate because:   Remains inpatient due to ongoing poor oral intake which needs to improve for DC to next level of care.  If oral intake remains poor, may have to coordinate with patient/family regarding alternate means of nutrition.  May need goals of care discussions by palliative care team.    Consultants:  PCCM  Procedures:  Antimicrobials:  Anti-infectives (From admission, onward)    Start     Dose/Rate Route Frequency Ordered Stop   11/22/22 1400  linezolid (ZYVOX) IVPB 600 mg  Status:  Discontinued        600 mg 300 mL/hr over 60 Minutes Intravenous Every 12 hours 11/22/22 1338 11/23/22 0844   11/22/22 1400  ceFEPIme (MAXIPIME) 2 g in sodium chloride 0.9 % 100 mL IVPB  Status:  Discontinued        2 g 200 mL/hr over 30 Minutes Intravenous Every 12 hours 11/22/22 1343 11/23/22 0844   11/22/22 1345  ceFEPIme (MAXIPIME) 2 g in sodium chloride 0.9 % 100 mL IVPB  Status:  Discontinued        2 g 200  mL/hr over 30 Minutes Intravenous  Once 11/22/22 1335 11/22/22 1343   11/22/22 1345  linezolid (ZYVOX) IVPB 600 mg  Status:  Discontinued        600 mg 300 mL/hr over 60 Minutes Intravenous Every 12 hours 11/22/22 1338 11/22/22 1338   11/22/22 0215  cefTRIAXone (ROCEPHIN) 1 g in sodium chloride 0.9 % 100 mL IVPB        1 g 200 mL/hr over 30 Minutes Intravenous  Once 11/22/22 0208 11/22/22 0319   11/22/22 0215  azithromycin (ZITHROMAX) 500 mg in sodium chloride 0.9 % 250 mL IVPB        500 mg 250 mL/hr over 60 Minutes Intravenous  Once 11/22/22 0208 11/22/22 0505      Subjective: Patient was seen and examined at bedside.  Overnight events noted.   She does have left upper  extremity weakness,  able to lift left leg.  She has decreased oral intake.   Her food is at the bedside but has not eaten much.  Objective: Vitals:   11/27/22 0421 11/27/22 0743 11/27/22 0745 11/27/22 1147  BP: 96/72 (!) 99/37 107/89 (!) 143/75  Pulse: 82 75  67  Resp: 16 18  18   Temp: 97.6 F (36.4 C) 98.3 F (36.8 C)  98.6 F (37 C)  TempSrc: Oral Oral  Oral  SpO2: 99% 100%  99%  Weight:      Height:        Intake/Output Summary (Last 24 hours) at 11/27/2022 1419 Last data filed at 11/27/2022 0820 Gross per 24 hour  Intake 210 ml  Output --  Net 210 ml   Filed Weights   11/24/22 0500 11/25/22 0500 11/26/22 0500  Weight: 90 kg 87 kg 88.3 kg    Examination:  General exam: Appears calm and comfortable, deconditioned, not in any acute distress. Respiratory system: Clear to auscultation. Respiratory effort normal.  RR 15 Cardiovascular system: S1 & S2 heard, RRR. No JVD, murmurs, rubs, gallops or clicks. No pedal edema. Gastrointestinal system: Abdomen is non distended, soft and non tender.. Normal bowel sounds heard. Central nervous system: Alert and oriented X 3. Left UE weakness Extremities: No edema, no cyanosis, no clubbing. Skin: No rashes, lesions or ulcers Psychiatry: Judgement and insight appear normal. Mood & affect appropriate.     Data Reviewed: I have personally reviewed following labs and imaging studies  CBC: Recent Labs  Lab 11/22/22 0019 11/22/22 0920 11/23/22 0739 11/23/22 1626 11/24/22 0820 11/24/22 1810 11/25/22 0520 11/26/22 0508 11/27/22 0707  WBC 7.3   < > 8.3  --  9.4  --  9.2 8.2 8.4  NEUTROABS 5.4  --   --   --   --   --   --   --   --   HGB 12.6   < > 10.2*   < > 11.5* 11.0* 11.0* 10.2* 9.7*  HCT 38.6   < > 31.6*   < > 34.0* 33.6* 33.5* 31.0* 29.7*  MCV 81.6   < > 80.8  --  78.0*  --  79.0* 81.2 80.5  PLT 386   < > 365  --  385  --  392 367 377   < > = values in this interval not displayed.   Basic Metabolic Panel: Recent  Labs  Lab 11/22/22 0920 11/23/22 0739 11/24/22 0820 11/25/22 0520 11/26/22 0508 11/27/22 0707  NA 137 139 135 139 137 137  K 4.0 3.4* 3.6 4.3 4.1 3.9  CL 102 108 104  106 106 105  CO2 20* 20* 22 22 22 23   GLUCOSE 379* 175* 240* 185* 146* 134*  BUN 23* 14 8 14 16 18   CREATININE 1.53* 0.89 0.77 1.16* 1.03* 1.05*  CALCIUM 8.7* 8.8* 8.9 9.2 8.8* 8.9  MG 1.7 1.6* 2.0 2.1  --   --   PHOS 3.1 1.7* 3.3 3.4  --   --    GFR: Estimated Creatinine Clearance: 66.2 mL/min (A) (by C-G formula based on SCr of 1.05 mg/dL (H)). Liver Function Tests: Recent Labs  Lab 11/22/22 0019 11/22/22 0920 11/23/22 0739 11/24/22 0820 11/25/22 0520  AST 28  --  14* 14*  --   ALT 16  --  14 14  --   ALKPHOS 78  --  58 74  --   BILITOT 1.2  --  0.5 0.7  --   PROT 7.3  --  6.8 7.3  --   ALBUMIN 3.7 3.3* 3.4* 3.4* 3.4*   Recent Labs  Lab 11/22/22 0019  LIPASE 25   Recent Labs  Lab 11/22/22 0920  AMMONIA 45*   Coagulation Profile: Recent Labs  Lab 11/22/22 0019  INR 1.1   Cardiac Enzymes: No results for input(s): "CKTOTAL", "CKMB", "CKMBINDEX", "TROPONINI" in the last 168 hours. BNP (last 3 results) No results for input(s): "PROBNP" in the last 8760 hours. HbA1C: Recent Labs    11/24/22 1810  HGBA1C 8.5*   CBG: Recent Labs  Lab 11/26/22 1052 11/26/22 1615 11/26/22 2109 11/27/22 0608 11/27/22 1145  GLUCAP 182* 200* 159* 126* 158*   Lipid Profile: No results for input(s): "CHOL", "HDL", "LDLCALC", "TRIG", "CHOLHDL", "LDLDIRECT" in the last 72 hours. Thyroid Function Tests: Recent Labs    11/25/22 1427  TSH 1.306  FREET4 1.48*  T3FREE 2.4   Anemia Panel: No results for input(s): "VITAMINB12", "FOLATE", "FERRITIN", "TIBC", "IRON", "RETICCTPCT" in the last 72 hours. Sepsis Labs: Recent Labs  Lab 11/22/22 0053 11/22/22 0352 11/22/22 0920 11/23/22 0147  LATICACIDVEN 2.5* 3.5* 2.4* 1.1    Recent Results (from the past 240 hour(s))  Blood Culture (routine x 2)      Status: None   Collection Time: 11/22/22 12:19 AM   Specimen: BLOOD RIGHT ARM  Result Value Ref Range Status   Specimen Description BLOOD RIGHT ARM  Final   Special Requests   Final    BOTTLES DRAWN AEROBIC AND ANAEROBIC Blood Culture adequate volume   Culture   Final    NO GROWTH 5 DAYS Performed at [facility], [address], [place], [place] [address]    Report Status 11/27/2022 FINAL  Final  Resp panel by RT-PCR (RSV, Flu A&B, Covid) Anterior Nasal Swab     Status: None   Collection Time: 11/22/22 12:19 AM   Specimen: Anterior Nasal Swab  Result Value Ref Range Status   SARS Coronavirus 2 by RT PCR NEGATIVE NEGATIVE Final   Influenza A by PCR NEGATIVE NEGATIVE Final   Influenza B by PCR NEGATIVE NEGATIVE Final    Comment: (NOTE) The Xpert Xpress SARS-CoV-2/FLU/RSV plus assay is intended as an aid in the diagnosis of influenza from Nasopharyngeal swab specimens and should not be used as a sole basis for treatment. Nasal washings and aspirates are unacceptable for Xpert Xpress SARS-CoV-2/FLU/RSV testing.  Fact Sheet for Patients: BloggerCourse.com  Fact Sheet for Healthcare Providers: SeriousBroker.it  This test is not yet approved or cleared by the [place] FDA and has been authorized for detection and/or diagnosis of SARS-CoV-2 by FDA  under an Emergency Use Authorization (EUA). This EUA will remain in effect (meaning this test can be used) for the duration of the COVID-19 declaration under Section 564(b)(1) of the Act, 21 U.S.C. section 360bbb-3(b)(1), unless the authorization is terminated or revoked.     Resp Syncytial Virus by PCR NEGATIVE NEGATIVE Final    Comment: (NOTE) Fact Sheet for Patients: BloggerCourse.com  Fact Sheet for Healthcare Providers: SeriousBroker.it  This test is not yet approved or cleared by the [place] FDA and has been  authorized for detection and/or diagnosis of SARS-CoV-2 by FDA under an Emergency Use Authorization (EUA). This EUA will remain in effect (meaning this test can be used) for the duration of the COVID-19 declaration under Section 564(b)(1) of the Act, 21 U.S.C. section 360bbb-3(b)(1), unless the authorization is terminated or revoked.  Performed at [facility], [address], [place], [place] [address]   Blood Culture (routine x 2)     Status: None   Collection Time: 11/22/22  1:41 AM   Specimen: BLOOD RIGHT HAND  Result Value Ref Range Status   Specimen Description BLOOD RIGHT HAND  Final   Special Requests   Final    BOTTLES DRAWN AEROBIC ONLY Blood Culture adequate volume   Culture   Final    NO GROWTH 5 DAYS Performed at [facility], [address], [place], [place] [address]    Report Status 11/27/2022 FINAL  Final    Radiology Studies: No results found.  Scheduled Meds:  atorvastatin  80 mg Oral Daily   baclofen  10 mg Oral TID   budesonide  0.25 mg Nebulization BID   Chlorhexidine Gluconate Cloth  6 each Topical Daily   clopidogrel  75 mg Oral Daily   escitalopram  20 mg Oral q morning   ezetimibe  10 mg Oral Daily   gabapentin  100 mg Oral TID   insulin aspart  0-15 Units Subcutaneous TID WC   insulin aspart  0-5 Units Subcutaneous QHS   insulin glargine-yfgn  15 Units Subcutaneous Daily   methimazole  5 mg Oral BID   metoprolol tartrate  50 mg Oral BID   pantoprazole  40 mg Oral BID AC   polyethylene glycol  17 g Oral Daily   ranolazine  500 mg Oral BID   rivaroxaban  10 mg Oral Daily   senna-docusate  2 tablet Oral QHS   sodium chloride flush  10-40 mL Intracatheter Q12H   tamsulosin  0.4 mg Oral QHS   topiramate  25 mg Oral BID   Continuous Infusions:   LOS: 5 days    Time spent: 50 MINS    Willeen Niece, MD [facility]   If 7PM-7AM, please contact night-coverage

## 2022-11-27 NOTE — Progress Notes (Signed)
Orthopedic Tech Progress Note Patient Details:  Lori [name] 1970/09/25 [id number]  Patient ID: [name], female   DOB: 10/31/1970, 52 y.o.   MRN: [id number] Order received for resting hand splint.  Attempted to donn prefabricated splint from [employer] and it does not fit appropriately based on hand position and pre-existing tone.  Communicated with OT that custom splint will need to be fabricated.    Keian Odriscoll OTR/L 11/27/2022, 3:42 PM

## 2022-11-27 NOTE — Progress Notes (Signed)
Speech Language Pathology Treatment: Dysphagia  Patient Details Name: Lori [name] MRN: [id number] DOB: 01-07-1971 Today's Date: 11/27/2022 Time: 4098-1191 SLP Time Calculation (min) (ACUTE ONLY): 15 min  Assessment / Plan / Recommendation Clinical Impression  Pt reports decreased appetite due to texture of current diet. Observed pt with trials of Dys 3 solids and thin liquids with no overt s/s of dysphagia or aspiration. Pt independently uses a liquid wash to assist with clearing mild residue. Recommend upgrading to Dys 3 diet with thin liquids. Note pt states she has difficulty ordering food due to unintelligibility. SLP provided speech intelligibility strategies and recommendations that she give her food order to nursing staff who can assist with ordering. Recommend pt continue to f/u with SLP upon d/c to target goals related to speech and language, with which pt verbalizes agreement. No further acute needs identified for swallowing goals. SLP to s/o at this time.    HPI HPI: Lori [name] is a 52 yo female presenting to ED 10/18 with increased somnolence and n/v. MBS 01/23/21 with moderate oropharyngeal dysphagia and silent aspiration. She was recommended a diet of Dys 1 textures with nectar thick liquids. Seen extensively throughout 2023 with significant oral dysphagia, although was ultimately recommended a Dys 3 diet with thin liquids. PMH includes prior CVA with residual L hemiparesis/spasticity and aphasia, IDDM2, CAD, diastolic CHF, HTN, hyperthyroidism, anxiety, depression      SLP Plan  All goals met      Recommendations for follow up therapy are one component of a multi-disciplinary discharge planning process, led by the attending physician.  Recommendations may be updated based on patient status, additional functional criteria and insurance authorization.    Recommendations  Diet recommendations: Dysphagia 3 (mechanical soft);Thin liquid Liquids provided via:  Straw;Cup Medication Administration: Whole meds with puree Supervision: Full supervision/cueing for compensatory strategies;Staff to assist with self feeding Compensations: Minimize environmental distractions;Slow rate;Small sips/bites Postural Changes and/or Swallow Maneuvers: Seated upright 90 degrees                  Oral care BID   Frequent or constant Supervision/Assistance Dysphagia, unspecified (R13.10)     All goals met     Gwynneth Aliment, M.A., CF-SLP Speech Language Pathology, Acute Rehabilitation Services  Secure Chat preferred [phone]   11/27/2022, 5:21 PM

## 2022-11-27 NOTE — Consult Note (Signed)
[facility]    11/27/2022  Elma [facility] 01-04-1971 [id number]  Insurance:  [employer] Dual Complete  Primary Care Provider:  Caesar Bookman, NP with [facility]   Patient was reviewed for less than 30 days readmission with extreme high risk score with a 5 day length of stay to assess for post hospital barriers to care.  Patient was screened for hospitalization and on behalf of [facility]  Care Coordination to assess for post hospital community care needs.  Patient is being considered for a skilled nursing facility level of care for post hospital transition. 12:45 pm Came to bedside and no family was currently present. Attempted to introduce this Clinical research associate and patient just starred.   If the patient goes to a [facility] affiliated facility then, patient can be followed by [facility] RN with traditional Medicare and approved Medicare Advantage plans.    Plan:   If patient goes to an affiliated facility, will then notify the [facility] RN can follow for any known or needs for transitional care needs for returning to post facility care coordination needs to return to community.  For questions or referrals, please contact:   Charlesetta Shanks, RN, BSN, CCM [place]  [facility], [facility] Liaison Direct Dial: [phone] or secure chat Website: [email or web address]

## 2022-11-28 DIAGNOSIS — G9341 Metabolic encephalopathy: Secondary | ICD-10-CM | POA: Diagnosis not present

## 2022-11-28 LAB — GLUCOSE, CAPILLARY
Glucose-Capillary: 146 mg/dL — ABNORMAL HIGH (ref 70–99)
Glucose-Capillary: 308 mg/dL — ABNORMAL HIGH (ref 70–99)
Glucose-Capillary: 171 mg/dL — ABNORMAL HIGH (ref 70–99)

## 2022-11-28 NOTE — Plan of Care (Signed)

## 2022-11-28 NOTE — Progress Notes (Signed)
PROGRESS NOTE    Lori [name]  ZOX:[id number] DOB: [birth year] DOA: 11/21/2022 PCP: Caesar Bookman, NP   Brief Narrative: This 52 year old female, medical history significant for CVA with residual left hemiparesis/spasticity/dysphagia, DM2/IDDM, CAD, chronic diastolic CHF, HTN, anxiety/depression, pheochromocytoma s/p unilateral adrenalectomy in 2018, hospitalization 9/29 - 10/3 for acute metabolic encephalopathy, presented to [facility] ED on 10/17 with altered mental status, nausea and vomiting. She was admitted for acute metabolic encephalopathy, AKI, intractable nausea and vomiting, hematemesis and tachycardia.  Postadmission on 10/18, became hypotensive with SBP in the 70s that recovered after IV fluid boluses.  Nausea, vomiting, dehydration and AKI resolved.  Mental status has improved but not clear what is her baseline.  Ongoing poor oral intake.  Sinus tachycardia suspected due to hyperthyroid.  Ongoing poor oral intake.  Also awaiting SNF.   Assessment & Plan:   Principal Problem:   Acute metabolic encephalopathy Active Problems:   Nausea & vomiting   AKI (acute kidney injury) (HCC)   History of CVA with spastic paresis (cerebrovascular accident)   Hyperthyroidism   Essential hypertension   Insulin dependent type 2 diabetes mellitus (HCC)   Left hemiplegia (HCC)   History of CAD (coronary artery disease)   Chronic heart failure with preserved ejection fraction (HFpEF) (HCC)   Chronic anticoagulation   Tachycardia   Anxiety and depression   Aphasia   Debility   Hyponatremia   Hypotension   Delirium   Dehydration   Acute metabolic encephalopathy/agitation:  Likely due to dehydration and polypharmacy.   No clear source of infection.   CT head and ABG without acute finding.   Encephalopathy seems to have resolved.  Might have some behavioral issues.   Baseline mental status is not clear and may need to elicit this from family.  Minimize opioids and sedative meds.   Delirium precautions.  Ongoing reorientation. Mental status Improving   Dehydration, nausea and vomiting:  Now resolved. CT chest, abdomen and pelvis without acute findings.  Abdominal exam benign.  Diet has been advanced to dysphagia 2 diet but per nursing, not really eating much.   This will be increased risk for recurrent dehydration.Only 10% intake at each meal has been documented.   Registered dietitian consulted for nutritional supplements.   If oral intake does not improve, then we will have to discuss with patient/spouse regarding alternate feeding options i.e. tube feeding.   Encouraged p.o. intake.   Acute Kidney Injury:  Likely due to the above.   Baseline Cr ~0.9-1.0.   AKI resolved but remains at risk for recurrent AKI due to poor oral intake. Push fluids orally.  Patient appears to be drinking well.  Nutritional shakes may help.   Positive gastric Hemoccult: Likely from Mallory-Weiss tear related to intractable nausea and vomiting, but cannot rule out gastritis or PUD.   Slight drop in Hgb likely dilutional from IV fluids.   Overall, H&H seems to be at baseline.  Nausea and vomiting seems to have resolved. Changed Protonix from IV to Oral Home Plavix and Xarelto have been resumed.  Monitor closely.   Dysphagia:  Speech therapy has advance diet to dysphagia 2 diet and thin liquids Patient however needs to be fed and taking in very little per nursing at bedside.  Encourage p.o. intake.   History of CVA with residual left hemiplegia/spasticity and dysarthria:  CT head without acute findings. Continue home Plavix and statin. PT and OT eval evaluated and recommended SNF.  TOC on board.   History of CAD:  Serial troponin negative.  EKG without acute ischemic finding. Continue home Plavix, Ranexa and statin.   Hypotension/history of essential hypertension:  Hypotension resolved.  Blood pressure elevated. Currently on metoprolol 50 Mg twice daily with good blood pressure  control but has sinus tachycardia which has improved.   Sinus tachycardia/ Hyperthyroidism:  Clinically euvolemic.  Had sustained sinus tachycardia in the 120s-130s on 10/21.  Suspect related to underlying hyperthyroidism TFTs 10/21: TSH 1.306, free T32.4, free T41.48. Continue current metoprolol 50 Mg twice daily and may need to consider switching over to propranolol. Low suspicion for infection.  Increased methimazole from 5 mg daily to 10 mg twice daily.  Sinus tachycardia has resolved.   Sinus rhythm mostly in the 80s-90s.  Will reduce to 5 Mg twice daily to see if she does equally well with that. Outpatient follow-up with repeat TFTs in 3 to 4 weeks.  Also need to closely follow LFTs and CBCs as outpatient. Recommend outpatient endocrinology consultation or follow-up.  Do not think or know if she actually sees one currently.   Had seen an endocrinologist in 2019 but that was for her DM and pheochromocytoma follow-up.   Uncontrolled IDDM-2 with hyperglycemia:  HbA1c 8.5 on 10/20 Currently on Semglee 15 units daily, mealtime NovoLog 3 units 3 times daily and moderate SSI.   These are much lower dose insulins than her home dose, will uptitrate. 2 hypoglycemic episodes last evening in the mid 50s.  Suspect due to poor oral intake.  Discontinue mealtime NovoLog.   History of diastolic CHF:  Appears euvolemic overall.   Anxiety and depression:  - Stable -Continue home meds.   Lactic acidosis:  Likely due to dehydration.  Infectious workup unrevealing.  Resolved.  Antibiotics discontinued   Hypokalemia/hypomagnesemia/hypophosphatemia -Monitor replenish as appropriate   Pheochromocytoma, s/p right-sided unilateral adrenalectomy:   Chronic anticoagulation for DVT 2023 PTA was on prophylactic dose anticoagulation Xarelto 10 Mg daily, confirmed by pharmacy on home med rec, resumed.   Monitor closely for bleeding.  Duration of anticoagulation not clear,?  Lifelong.  Also on Plavix for  CVA.  Obviously increases bleeding risk.   This needs to be closely evaluated as outpatient by her PCP.   Body mass index is 35.6 kg/m./Obesity   DVT prophylaxis: Xarelto Code Status: Full code Family Communication: No family at bed side. Disposition Plan:     Status is: Inpatient Remains inpatient appropriate because:   Remains inpatient due to ongoing poor oral intake which needs to improve for DC to next level of care.  If oral intake remains poor, may have to coordinate with patient/family regarding alternate means of nutrition.  May need goals of care discussions by palliative care team.    Consultants:  PCCM  Procedures:  Antimicrobials:  Anti-infectives (From admission, onward)    Start     Dose/Rate Route Frequency Ordered Stop   11/22/22 1400  linezolid (ZYVOX) IVPB 600 mg  Status:  Discontinued        600 mg 300 mL/hr over 60 Minutes Intravenous Every 12 hours 11/22/22 1338 11/23/22 0844   11/22/22 1400  ceFEPIme (MAXIPIME) 2 g in sodium chloride 0.9 % 100 mL IVPB  Status:  Discontinued        2 g 200 mL/hr over 30 Minutes Intravenous Every 12 hours 11/22/22 1343 11/23/22 0844   11/22/22 1345  ceFEPIme (MAXIPIME) 2 g in sodium chloride 0.9 % 100 mL IVPB  Status:  Discontinued        2 g 200  mL/hr over 30 Minutes Intravenous  Once 11/22/22 1335 11/22/22 1343   11/22/22 1345  linezolid (ZYVOX) IVPB 600 mg  Status:  Discontinued        600 mg 300 mL/hr over 60 Minutes Intravenous Every 12 hours 11/22/22 1338 11/22/22 1338   11/22/22 0215  cefTRIAXone (ROCEPHIN) 1 g in sodium chloride 0.9 % 100 mL IVPB        1 g 200 mL/hr over 30 Minutes Intravenous  Once 11/22/22 0208 11/22/22 0319   11/22/22 0215  azithromycin (ZITHROMAX) 500 mg in sodium chloride 0.9 % 250 mL IVPB        500 mg 250 mL/hr over 60 Minutes Intravenous  Once 11/22/22 0208 11/22/22 0505      Subjective: Patient was seen and examined at bedside.  Overnight events noted.   She does have Left upper  extremity weakness, able to lift left leg. Her food is at the bedside but has not eaten much.  Objective: Vitals:   11/28/22 0759 11/28/22 0813 11/28/22 0838 11/28/22 1256  BP: 105/77   107/69  Pulse: 81   64  Resp: 16   18  Temp:  98 F (36.7 C)  98.7 F (37.1 C)  TempSrc:  Oral  Oral  SpO2: 100%  98% 100%  Weight:      Height:        Intake/Output Summary (Last 24 hours) at 11/28/2022 1405 Last data filed at 11/28/2022 0817 Gross per 24 hour  Intake 130 ml  Output 600 ml  Net -470 ml   Filed Weights   11/25/22 0500 11/26/22 0500 11/28/22 0506  Weight: 87 kg 88.3 kg 89.1 kg    Examination:  General exam: Appears comfortable, deconditioned, not in any acute distress. Respiratory system: CTA bilaterally. Respiratory effort normal.  RR 13 Cardiovascular system: S1 & S2 heard, RRR. No murmer. No pedal edema. Gastrointestinal system: Abdomen is non distended, soft and non tender.. Normal bowel sounds heard. Central nervous system: Alert and oriented X 3. Left UE weakness Extremities: No edema, no cyanosis, no clubbing. Skin: No rashes, lesions or ulcers Psychiatry: Judgement and insight appear normal. Mood & affect appropriate.     Data Reviewed: I have personally reviewed following labs and imaging studies  CBC: Recent Labs  Lab 11/22/22 0019 11/22/22 0920 11/23/22 0739 11/23/22 1626 11/24/22 0820 11/24/22 1810 11/25/22 0520 11/26/22 0508 11/27/22 0707  WBC 7.3   < > 8.3  --  9.4  --  9.2 8.2 8.4  NEUTROABS 5.4  --   --   --   --   --   --   --   --   HGB 12.6   < > 10.2*   < > 11.5* 11.0* 11.0* 10.2* 9.7*  HCT 38.6   < > 31.6*   < > 34.0* 33.6* 33.5* 31.0* 29.7*  MCV 81.6   < > 80.8  --  78.0*  --  79.0* 81.2 80.5  PLT 386   < > 365  --  385  --  392 367 377   < > = values in this interval not displayed.   Basic Metabolic Panel: Recent Labs  Lab 11/22/22 0920 11/23/22 0739 11/24/22 0820 11/25/22 0520 11/26/22 0508 11/27/22 0707  NA 137 139 135  139 137 137  K 4.0 3.4* 3.6 4.3 4.1 3.9  CL 102 108 104 106 106 105  CO2 20* 20* 22 22 22 23   GLUCOSE 379* 175* 240* 185* 146* 134*  BUN  23* 14 8 14 16 18   CREATININE 1.53* 0.89 0.77 1.16* 1.03* 1.05*  CALCIUM 8.7* 8.8* 8.9 9.2 8.8* 8.9  MG 1.7 1.6* 2.0 2.1  --   --   PHOS 3.1 1.7* 3.3 3.4  --   --    GFR: Estimated Creatinine Clearance: 66.5 mL/min (A) (by C-G formula based on SCr of 1.05 mg/dL (H)). Liver Function Tests: Recent Labs  Lab 11/22/22 0019 11/22/22 0920 11/23/22 0739 11/24/22 0820 11/25/22 0520  AST 28  --  14* 14*  --   ALT 16  --  14 14  --   ALKPHOS 78  --  58 74  --   BILITOT 1.2  --  0.5 0.7  --   PROT 7.3  --  6.8 7.3  --   ALBUMIN 3.7 3.3* 3.4* 3.4* 3.4*   Recent Labs  Lab 11/22/22 0019  LIPASE 25   Recent Labs  Lab 11/22/22 0920  AMMONIA 45*   Coagulation Profile: Recent Labs  Lab 11/22/22 0019  INR 1.1   Cardiac Enzymes: No results for input(s): "CKTOTAL", "CKMB", "CKMBINDEX", "TROPONINI" in the last 168 hours. BNP (last 3 results) No results for input(s): "PROBNP" in the last 8760 hours. HbA1C: No results for input(s): "HGBA1C" in the last 72 hours.  CBG: Recent Labs  Lab 11/27/22 0608 11/27/22 1145 11/27/22 1627 11/27/22 2115 11/28/22 0623  GLUCAP 126* 158* 239* 167* 146*   Lipid Profile: No results for input(s): "CHOL", "HDL", "LDLCALC", "TRIG", "CHOLHDL", "LDLDIRECT" in the last 72 hours. Thyroid Function Tests: Recent Labs    11/25/22 1427  TSH 1.306  FREET4 1.48*  T3FREE 2.4   Anemia Panel: No results for input(s): "VITAMINB12", "FOLATE", "FERRITIN", "TIBC", "IRON", "RETICCTPCT" in the last 72 hours. Sepsis Labs: Recent Labs  Lab 11/22/22 0053 11/22/22 0352 11/22/22 0920 11/23/22 0147  LATICACIDVEN 2.5* 3.5* 2.4* 1.1    Recent Results (from the past 240 hour(s))  Blood Culture (routine x 2)     Status: None   Collection Time: 11/22/22 12:19 AM   Specimen: BLOOD RIGHT ARM  Result Value Ref Range Status    Specimen Description BLOOD RIGHT ARM  Final   Special Requests   Final    BOTTLES DRAWN AEROBIC AND ANAEROBIC Blood Culture adequate volume   Culture   Final    NO GROWTH 5 DAYS Performed at [facility], [address] [place], [place] [address]    Report Status 11/27/2022 FINAL  Final  Resp panel by RT-PCR (RSV, Flu A&B, Covid) Anterior Nasal Swab     Status: None   Collection Time: 11/22/22 12:19 AM   Specimen: Anterior Nasal Swab  Result Value Ref Range Status   SARS Coronavirus 2 by RT PCR NEGATIVE NEGATIVE Final   Influenza A by PCR NEGATIVE NEGATIVE Final   Influenza B by PCR NEGATIVE NEGATIVE Final    Comment: (NOTE) The Xpert Xpress SARS-CoV-2/FLU/RSV plus assay is intended as an aid in the diagnosis of influenza from Nasopharyngeal swab specimens and should not be used as a sole basis for treatment. Nasal washings and aspirates are unacceptable for Xpert Xpress SARS-CoV-2/FLU/RSV testing.  Fact Sheet for Patients: BloggerCourse.com  Fact Sheet for Healthcare Providers: SeriousBroker.it  This test is not yet approved or cleared by the [place] FDA and has been authorized for detection and/or diagnosis of SARS-CoV-2 by FDA under an Emergency Use Authorization (EUA). This EUA will remain in effect (meaning this test can be used) for the duration of  the COVID-19 declaration under Section 564(b)(1) of the Act, 21 U.S.C. section 360bbb-3(b)(1), unless the authorization is terminated or revoked.     Resp Syncytial Virus by PCR NEGATIVE NEGATIVE Final    Comment: (NOTE) Fact Sheet for Patients: BloggerCourse.com  Fact Sheet for Healthcare Providers: SeriousBroker.it  This test is not yet approved or cleared by the [place] FDA and has been authorized for detection and/or diagnosis of SARS-CoV-2 by FDA under an Emergency Use Authorization (EUA). This  EUA will remain in effect (meaning this test can be used) for the duration of the COVID-19 declaration under Section 564(b)(1) of the Act, 21 U.S.C. section 360bbb-3(b)(1), unless the authorization is terminated or revoked.  Performed at [facility], [address] [place], [place] [address]   Blood Culture (routine x 2)     Status: None   Collection Time: 11/22/22  1:41 AM   Specimen: BLOOD RIGHT HAND  Result Value Ref Range Status   Specimen Description BLOOD RIGHT HAND  Final   Special Requests   Final    BOTTLES DRAWN AEROBIC ONLY Blood Culture adequate volume   Culture   Final    NO GROWTH 5 DAYS Performed at [facility], [address] [place], [place] [address]    Report Status 11/27/2022 FINAL  Final    Radiology Studies: No results found.  Scheduled Meds:  atorvastatin  80 mg Oral Daily   baclofen  10 mg Oral TID   budesonide  0.25 mg Nebulization BID   Chlorhexidine Gluconate Cloth  6 each Topical Daily   clopidogrel  75 mg Oral Daily   escitalopram  20 mg Oral q morning   ezetimibe  10 mg Oral Daily   feeding supplement  237 mL Oral BID BM   gabapentin  100 mg Oral TID   insulin aspart  0-15 Units Subcutaneous TID WC   insulin aspart  0-5 Units Subcutaneous QHS   insulin glargine-yfgn  15 Units Subcutaneous Daily   methimazole  5 mg Oral BID   metoprolol tartrate  50 mg Oral BID   pantoprazole  40 mg Oral BID AC   polyethylene glycol  17 g Oral Daily   ranolazine  500 mg Oral BID   rivaroxaban  10 mg Oral Daily   senna-docusate  2 tablet Oral QHS   sodium chloride flush  10-40 mL Intracatheter Q12H   tamsulosin  0.4 mg Oral QHS   topiramate  25 mg Oral BID   Continuous Infusions:   LOS: 6 days    Time spent: 35 MINS    Willeen Niece, MD [facility]   If 7PM-7AM, please contact night-coverage

## 2022-11-28 NOTE — TOC Progression Note (Addendum)
Transition of Care [facility]) - Progression Note    Patient Details  Name: Lori [name] MRN: [id number] Date of Birth: Jun 27, 1970  Transition of Care [facility] CM/SW Contact  [name], [place] Phone Number: 11/28/2022, 2:02 PM  Clinical Narrative:     Update 1432 CSW reached out to pt's spouse, [name], to update him on bed offer at [facility]. There was no answer. CSW unable to leave VM. CSW went to pt's bedside, spouse not in the room, pt's not oriented to situation, assigned CSW unit social worker to follow up at another more opportune time.   CSW reached out to [name], [phone] regarding bed offer. CSW spoke with [name] in admissions. She stated that as of now, they could not offer a bed to pt due to poor intake currently and "agitation." She stated that if those aspects improved, possible bed offer could be extended.   TOC will continue to follow.   Expected Discharge Plan: Skilled Nursing Facility Barriers to Discharge: Continued Medical Work up, English as a second language teacher  Expected Discharge Plan and Services     Post Acute Care Choice: Skilled Nursing Facility Living arrangements for the past 2 months: Single Family Home                                       Social Determinants of Health (SDOH) Interventions SDOH Screenings   Food Insecurity: No Food Insecurity (11/27/2022)  Housing: Patient Declined (11/27/2022)  Transportation Needs: No Transportation Needs (11/27/2022)  Utilities: Not At Risk (11/27/2022)  Depression (PHQ2-9): Low Risk  (11/01/2022)  Recent Concern: Depression (PHQ2-9) - Medium Risk (10/28/2022)  Tobacco Use: Low Risk  (11/27/2022)    Readmission Risk Interventions    11/05/2022    1:38 PM 10/15/2022   10:25 AM 08/29/2021    2:32 PM  Readmission Risk Prevention Plan  Transportation Screening Complete Complete Complete  Medication Review Oceanographer) Complete  Complete  PCP or Specialist appointment within 3-5 days of  discharge Complete Complete Complete  HRI or Home Care Consult Complete Complete Complete  SW Recovery Care/Counseling Consult Complete Complete Complete  Palliative Care Screening Not Applicable Not Applicable Not Applicable  Skilled Nursing Facility Not Applicable Not Applicable Complete

## 2022-11-28 NOTE — TOC Progression Note (Signed)
Transition of Care [facility]) - Progression Note    Patient Details  Name: Lori [name] MRN: [id number] Date of Birth: 04/12/1970  Transition of Care [facility]) CM/SW Contact  Baldemar [place], [place] Phone Number: 11/28/2022, 7:54 PM  Clinical Narrative:   CSW received call back from spouse that he would come to the hospital this morning to meet with CSW. CSW met with patient and spouse to discuss SNF offers. Patient asking about [facility] as she has a friend that works there. CSW contacted [facility] to send referral, awaiting review. CSW also spoke with patient's relative, Sharmaine, per request about the patient's Medicaid possibly being canceled at the end of the month. CSW discussed with them about reaching out to [facility] caseworker, but they asked for assistance. CSW looked up number for Medicaid recertifications, left a voicemail for Abbi. Awaiting call back.     Expected Discharge Plan: Skilled Nursing Facility Barriers to Discharge: Continued Medical Work up, English as a second language teacher  Expected Discharge Plan and Services     Post Acute Care Choice: Skilled Nursing Facility Living arrangements for the past 2 months: Single Family Home                                       Social Determinants of Health (SDOH) Interventions SDOH Screenings   Food Insecurity: No Food Insecurity (11/27/2022)  Housing: Patient Declined (11/27/2022)  Transportation Needs: No Transportation Needs (11/27/2022)  Utilities: Not At Risk (11/27/2022)  Depression (PHQ2-9): Low Risk  (11/01/2022)  Recent Concern: Depression (PHQ2-9) - Medium Risk (10/28/2022)  Tobacco Use: Low Risk  (11/27/2022)    Readmission Risk Interventions    11/05/2022    1:38 PM 10/15/2022   10:25 AM 08/29/2021    2:32 PM  Readmission Risk Prevention Plan  Transportation Screening Complete Complete Complete  Medication Review Oceanographer) Complete  Complete  PCP or Specialist appointment within 3-5 days of  discharge Complete Complete Complete  HRI or Home Care Consult Complete Complete Complete  SW Recovery Care/Counseling Consult Complete Complete Complete  Palliative Care Screening Not Applicable Not Applicable Not Applicable  Skilled Nursing Facility Not Applicable Not Applicable Complete

## 2022-11-28 NOTE — Progress Notes (Signed)
Occupational Therapy Treatment Patient Details Name: Lori [name] MRN: [id number] DOB: 09-12-1970 Today's Date: 11/28/2022   History of present illness 52 y.o. female presented to [facility] ED with behavioral change, increased somnolence and nausea and vomiting. Dx Dehydration with AKI. PMH of CVA with left hemiparesis/spasticity/aphasia, IDDM-2, CAD, diastolic CHF, HTN, hyperthyroidism, anxiety and depression presenting with behavioral change, increased somnolence and nausea and vomiting.   OT comments  Pt is making fair progress towards their acute OT goals. Pt seen to assess LUE splinting needs, pt has hard end feel at 2-5 MP joints at about 30* from full extension with complaint of pain. IP PROM is WFL, wrist rests in extension but is able to be comfortably stretched into a neutral position. Pt will need a fabricated splint due to MP positioning. Pt with trace activation of distal hand but does not attempt to use functionally. Pt also noted to have an incontinent BM, total A needed for hygiene at bed level and max A +2 for rolling L&R with very little initiation or problem solving noted by pt. OT to continue to follow acutely to facilitate progress towards established goals. Pt will continue to benefit from skilled inpatient follow up therapy, <3 hours/day.        If plan is discharge home, recommend the following:  Two people to help with walking and/or transfers;A lot of help with bathing/dressing/bathroom;Assist for transportation;Help with stairs or ramp for entrance;Supervision due to cognitive status   Equipment Recommendations  Other (comment)       Precautions / Restrictions Precautions Precautions: Fall Precaution Comments: chronic L side weakness 2* CVA Restrictions Weight Bearing Restrictions: No Other Position/Activity Restrictions: L sided weakness       Mobility Bed Mobility Overal bed mobility: Needs Assistance Bed Mobility: Rolling Rolling: Max assist, +2 for  physical assistance, +2 for safety/equipment, Used rails         General bed mobility comments: several rolls for hygiene, pt with little initiation to task    Transfers               Balance Overall balance assessment: Needs assistance           ADL either performed or assessed with clinical judgement   ADL Overall ADL's : Needs assistance/impaired Eating/Feeding: Set up;Sitting Eating/Feeding Details (indicate cue type and reason): sitting upright in bed     Upper Body Bathing: Maximal assistance;Bed level               Toilet Transfer: Total assistance Toilet Transfer Details (indicate cue type and reason): incontinent BM at bed level Toileting- Clothing Manipulation and Hygiene: Total assistance;+2 for safety/equipment;+2 for physical assistance;Bed level       Functional mobility during ADLs: Maximal assistance;+2 for physical assistance;+2 for safety/equipment General ADL Comments: bed level due to incontinent BM and bath needed    Extremity/Trunk Assessment Upper Extremity Assessment Upper Extremity Assessment: LUE deficits/detail LUE Deficits / Details: MPs limited to ~20* from full extension, hard end feel. Pt reports significant pain in this position. Wrist  rests in full extension, able to move into flexion with PROM/stretching. IPs PROM are WFL LUE Sensation: decreased light touch;decreased proprioception LUE Coordination: decreased fine motor;decreased gross motor   Lower Extremity Assessment Lower Extremity Assessment: Defer to PT evaluation           Perception Perception Perception: Not tested   Praxis Praxis Praxis: Not tested    Cognition Arousal: Alert Behavior During Therapy: Flat affect Overall Cognitive Status: Impaired/Different  from baseline Area of Impairment: Following commands, Safety/judgement                       Following Commands: Follows one step commands consistently Safety/Judgement: Decreased  awareness of deficits     General Comments: follows simple cues with increased time. Complains of pain but had a difficult time detailing location. Needs significant cues for sequencing and problem solving        Exercises Other Exercises Other Exercises: PROM of LUE to hard end feel or pain       General Comments VSS. pt had DM. NT present and assisting    Pertinent Vitals/ Pain       Pain Assessment Pain Assessment: Faces Faces Pain Scale: Hurts little more Pain Location: butt and L hand with PROM Pain Descriptors / Indicators: Discomfort, Grimacing Pain Intervention(s): Limited activity within patient's tolerance, Monitored during session   Frequency  Min 1X/week        Progress Toward Goals  OT Goals(current goals can now be found in the care plan section)  Progress towards OT goals: Progressing toward goals  Acute Rehab OT Goals Patient Stated Goal: to eat OT Goal Formulation: With patient Time For Goal Achievement: 12/08/22 Potential to Achieve Goals: Fair ADL Goals Pt Will Perform Grooming: with set-up;sitting Pt Will Transfer to Toilet: with min assist;ambulating Pt Will Perform Toileting - Clothing Manipulation and hygiene: with min assist;sit to/from stand Pt/caregiver will Perform Home Exercise Program: Left upper extremity;Independently   AM-PAC OT "6 Clicks" Daily Activity     Outcome Measure   Help from another person eating meals?: A Lot Help from another person taking care of personal grooming?: A Lot Help from another person toileting, which includes using toliet, bedpan, or urinal?: Total Help from another person bathing (including washing, rinsing, drying)?: A Lot Help from another person to put on and taking off regular upper body clothing?: A Lot Help from another person to put on and taking off regular lower body clothing?: A Lot 6 Click Score: 11    End of Session    OT Visit Diagnosis: Other abnormalities of gait and mobility  (R26.89);Muscle weakness (generalized) (M62.81);Other symptoms and signs involving the nervous system (R29.898)   Activity Tolerance Patient tolerated treatment well   Patient Left in bed;with bed alarm set;with call bell/phone within reach;with family/visitor present   Nurse Communication Mobility status        Time: 1610-9604 OT Time Calculation (min): 40 min  Charges: OT General Charges $OT Visit: 1 Visit OT Treatments $Self Care/Home Management : 38-52 mins  Derenda Mis, OTR/L Acute Rehabilitation Services Office [phone] Secure Chat Communication Preferred   Donia Pounds 11/28/2022, 1:16 PM

## 2022-11-28 NOTE — Plan of Care (Signed)
  Problem: Pain Managment: Goal: General experience of comfort will improve Outcome: Progressing   Problem: Safety: Goal: Ability to remain free from injury will improve Outcome: Progressing   Problem: Skin Integrity: Goal: Risk for impaired skin integrity will decrease Outcome: Progressing   

## 2022-11-28 NOTE — Progress Notes (Signed)
Physical Therapy Treatment Patient Details Name: Lori [name] MRN: [id number] DOB: 20-Jan-1971 Today's Date: 11/28/2022   History of Present Illness 52 y.o. female presented to [facility] ED 10/17 with behavioral change, increased somnolence and nausea and vomiting. Dx Dehydration with AKI. PMH of CVA with left hemiparesis/spasticity/aphasia, IDDM-2, CAD, diastolic CHF, HTN, hyperthyroidism, anxiety and depression presenting with behavioral change, increased somnolence and nausea and vomiting.    PT Comments  Tolerated treatment well, improving functional mobility tolerance, rising to EOB with mod assist, and transitioning to standing with Min A +2 from bed and recliner (use to Olla and RW respectively.) Progressed with short distance gait 5 ft forward and backwards today, min A +2 for safety and balance. Will plan to try hemi-walker next visit. Patient will continue to benefit from skilled physical therapy services to further improve independence with functional mobility. Patient will benefit from continued inpatient follow up therapy, <3 hours/day     If plan is discharge home, recommend the following: Assist for transportation;Assistance with feeding;Assistance with cooking/housework;Help with stairs or ramp for entrance;A lot of help with bathing/dressing/bathroom;Two people to help with walking and/or transfers;Supervision due to cognitive status   Can travel by private vehicle     No (Likely soon)  Equipment Recommendations  None recommended by PT    Recommendations for Other Services OT consult     Precautions / Restrictions Precautions Precautions: Fall Precaution Comments: chronic L side weakness 2* CVA Restrictions Weight Bearing Restrictions: No Other Position/Activity Restrictions: L sided weakness     Mobility  Bed Mobility Overal bed mobility: Needs Assistance Bed Mobility: Rolling, Sidelying to Sit Rolling: Used rails, Min assist Sidelying to sit: Mod assist        General bed mobility comments: Rolls towards Rt side, reaching for rail and pulling, assisted with LEs at min A level. Mod A to rise to EOB, practiced x2 with cues for technique. Felt dizzy first attempt but resolved on second round.    Transfers Overall transfer level: Needs assistance Equipment used: Rolling walker (2 wheels), Ambulation equipment used Transfers: Sit to/from Stand, Bed to chair/wheelchair/BSC Sit to Stand: Min assist, +2 physical assistance, Via lift equipment           General transfer comment: Practiced sit to stand transition with Min A +2 from elevated bed surface and pulling with RUE through [place]. Cues for technique. Rose from [employer] A +1 and descended with good control while holding device with RUE to lower. Min A +2 for sit to stand transfer training from recliner x2 with RW for support upon standing. Transfer via Lift Equipment: Stedy  Ambulation/Gait Ambulation/Gait assistance: Min assist, +2 safety/equipment Gait Distance (Feet): 5 Feet (+5) Assistive device: Rolling walker (2 wheels) Gait Pattern/deviations: Step-through pattern, Decreased stride length, Decreased stance time - left, Knees buckling Gait velocity: dec   Pre-gait activities: weight shift, static march, RW for support. General Gait Details: Educated on safe AD use with RW for support. Able to walk forward with min assist +2 for walker control, weight shift and balance. Lt knee buckles occasionally but remains standing with RW for support. May benefit from hemi walker rather than RW next visit. Took steps backwards with more difficulty, attempts to sit early. Cues for sequencing, and safety.   Stairs             Wheelchair Mobility     Tilt Bed    Modified Rankin (Stroke Patients Only)       Balance Overall balance  assessment: Needs assistance Sitting-balance support: Single extremity supported, Feet supported Sitting balance-Leahy Scale: Poor   Postural  control: Right lateral lean Standing balance support: During functional activity, Single extremity supported Standing balance-Leahy Scale: Poor                              Cognition Arousal: Alert Behavior During Therapy: Flat affect Overall Cognitive Status: No family/caregiver present to determine baseline cognitive functioning Area of Impairment: Following commands, Safety/judgement                       Following Commands: Follows one step commands consistently Safety/Judgement: Decreased awareness of deficits              Exercises      General Comments General comments (skin integrity, edema, etc.): VSS. pt had DM. NT present and assisting      Pertinent Vitals/Pain Pain Assessment Pain Assessment: No/denies pain Pain Intervention(s): Monitored during session    Home Living                          Prior Function            PT Goals (current goals can now be found in the care plan section) Acute Rehab PT Goals Patient Stated Goal: Go to rehab PT Goal Formulation: With patient Time For Goal Achievement: 12/06/22 Potential to Achieve Goals: Good Progress towards PT goals: Progressing toward goals    Frequency    Min 1X/week      PT Plan      Co-evaluation              AM-PAC PT "6 Clicks" Mobility   Outcome Measure  Help needed turning from your back to your side while in a flat bed without using bedrails?: A Little Help needed moving from lying on your back to sitting on the side of a flat bed without using bedrails?: A Lot Help needed moving to and from a bed to a chair (including a wheelchair)?: A Lot Help needed standing up from a chair using your arms (e.g., wheelchair or bedside chair)?: A Lot Help needed to walk in hospital room?: A Lot Help needed climbing 3-5 steps with a railing? : Total 6 Click Score: 12    End of Session Equipment Utilized During Treatment: Gait belt Activity Tolerance:  Patient tolerated treatment well Patient left: in chair;with call bell/phone within reach;with chair alarm set;with SCD's reapplied Nurse Communication: Mobility status PT Visit Diagnosis: Hemiplegia and hemiparesis;Muscle weakness (generalized) (M62.81);History of falling (Z91.81);Difficulty in walking, not elsewhere classified (R26.2);Other symptoms and signs involving the nervous system (R29.898) Hemiplegia - Right/Left: Left Hemiplegia - dominant/non-dominant: Non-dominant Hemiplegia - caused by: Cerebral infarction     Time: 1610-9604 PT Time Calculation (min) (ACUTE ONLY): 17 min  Charges:    $Therapeutic Activity: 8-22 mins PT General Charges $$ ACUTE PT VISIT: 1 Visit                     Kathlyn Sacramento, PT, DPT [facility]  Rehabilitation Services Physical Therapist Office: [phone] Website: [email or web address]    Berton Mount 11/28/2022, 3:15 PM

## 2022-11-29 DIAGNOSIS — G9341 Metabolic encephalopathy: Secondary | ICD-10-CM | POA: Diagnosis not present

## 2022-11-29 LAB — GLUCOSE, CAPILLARY
Glucose-Capillary: 172 mg/dL — ABNORMAL HIGH (ref 70–99)
Glucose-Capillary: 145 mg/dL — ABNORMAL HIGH (ref 70–99)
Glucose-Capillary: 278 mg/dL — ABNORMAL HIGH (ref 70–99)
Glucose-Capillary: 340 mg/dL — ABNORMAL HIGH (ref 70–99)

## 2022-11-29 MED ORDER — INSULIN GLARGINE-YFGN 100 UNIT/ML ~~LOC~~ SOLN
18.0000 [IU] | Freq: Every day | SUBCUTANEOUS | Status: DC
  Administered 2022-11-30 – 2022-12-01 (×2): 18 [IU] via SUBCUTANEOUS
  Filled 2022-11-29 (×2): qty 0.18

## 2022-11-29 MED ORDER — DIPHENHYDRAMINE HCL 25 MG PO CAPS
25.0000 mg | ORAL_CAPSULE | Freq: Four times a day (QID) | ORAL | Status: DC | PRN
  Administered 2022-11-29 – 2022-12-05 (×7): 25 mg via ORAL
  Filled 2022-11-29 (×8): qty 1

## 2022-11-29 NOTE — TOC Progression Note (Signed)
Transition of Care [facility]) - Progression Note    Patient Details  Name: Skilah Hermiz MRN: [id number] Date of Birth: 14-Nov-1970  Transition of Care [facility] CM/SW Contact  Mearl Latin, LCSW Phone Number: 11/29/2022, 9:30 AM  Clinical Narrative:    CSW contacted patient' spouse; no answer and voicemail full. Will attempt to meet with patient regarding alternative SNF bed offers.    Expected Discharge Plan: Skilled Nursing Facility Barriers to Discharge: Continued Medical Work up, English as a second language teacher  Expected Discharge Plan and Services     Post Acute Care Choice: Skilled Nursing Facility Living arrangements for the past 2 months: Single Family Home                                       Social Determinants of Health (SDOH) Interventions SDOH Screenings   Food Insecurity: No Food Insecurity (11/27/2022)  Housing: Patient Declined (11/27/2022)  Transportation Needs: No Transportation Needs (11/27/2022)  Utilities: Not At Risk (11/27/2022)  Depression (PHQ2-9): Low Risk  (11/01/2022)  Recent Concern: Depression (PHQ2-9) - Medium Risk (10/28/2022)  Tobacco Use: Low Risk  (11/27/2022)    Readmission Risk Interventions    11/05/2022    1:38 PM 10/15/2022   10:25 AM 08/29/2021    2:32 PM  Readmission Risk Prevention Plan  Transportation Screening Complete Complete Complete  Medication Review Oceanographer) Complete  Complete  PCP or Specialist appointment within 3-5 days of discharge Complete Complete Complete  HRI or Home Care Consult Complete Complete Complete  SW Recovery Care/Counseling Consult Complete Complete Complete  Palliative Care Screening Not Applicable Not Applicable Not Applicable  Skilled Nursing Facility Not Applicable Not Applicable Complete

## 2022-11-29 NOTE — Progress Notes (Signed)
PROGRESS NOTE    Lori [name]  ZOX:[id number] DOB: Oct 05, 1970 DOA: 11/21/2022 PCP: Caesar Bookman, NP   Brief Narrative: This 52 year old female, medical history significant for CVA with residual left hemiparesis/spasticity/dysphagia, DM2/IDDM, CAD, chronic diastolic CHF, HTN, anxiety/depression, pheochromocytoma s/p unilateral adrenalectomy in 2018, hospitalization 9/29 - 10/3 for acute metabolic encephalopathy, presented to [facility] ED on 10/17 with altered mental status, nausea and vomiting. She was admitted for acute metabolic encephalopathy, AKI, intractable nausea and vomiting, hematemesis and tachycardia.  Postadmission on 10/18, became hypotensive with SBP in the 70s that recovered after IV fluid boluses.  Nausea, vomiting, dehydration and AKI resolved.  Mental status has improved but not clear what is her baseline.  Ongoing poor oral intake.  Sinus tachycardia suspected due to hyperthyroid.  Ongoing poor oral intake.  Also awaiting SNF.   Assessment & Plan:   Principal Problem:   Acute metabolic encephalopathy Active Problems:   Nausea & vomiting   AKI (acute kidney injury) (HCC)   History of CVA with spastic paresis (cerebrovascular accident)   Hyperthyroidism   Essential hypertension   Insulin dependent type 2 diabetes mellitus (HCC)   Left hemiplegia (HCC)   History of CAD (coronary artery disease)   Chronic heart failure with preserved ejection fraction (HFpEF) (HCC)   Chronic anticoagulation   Tachycardia   Anxiety and depression   Aphasia   Debility   Hyponatremia   Hypotension   Delirium   Dehydration  Acute metabolic encephalopathy/agitation:  Likely due to dehydration and polypharmacy.   No clear source of infection.   CT head and ABG without acute finding.   Encephalopathy seems to have resolved.  Might have some behavioral issues.   Baseline mental status is not clear and may need to elicit this from family.  Minimize opioids and sedative meds.  Delirium  precautions.  Ongoing reorientation. Mental status Improving   Dehydration, nausea and vomiting:  Now resolved. CT chest, abdomen and pelvis without acute findings.  Abdominal exam benign.  Diet has been advanced to dysphagia 2 diet but per nursing, not really eating much.   This will be increased risk for recurrent dehydration. Only 10% intake at each meal has been documented.   Registered dietitian consulted for nutritional supplements.   If oral intake does not improve, then we will have to discuss with patient/spouse regarding alternate feeding options i.e. tube feeding.   Encouraged p.o. intake.   Acute Kidney Injury:  Likely due to the above.   Baseline Cr ~0.9-1.0.   AKI resolved but remains at risk for recurrent AKI due to poor oral intake. Push fluids orally.  Patient appears to be drinking well.  Nutritional shakes may help.   Positive gastric Hemoccult: Likely from Mallory-Weiss tear related to intractable nausea and vomiting, but cannot rule out gastritis or PUD.   Slight drop in Hgb likely dilutional from IV fluids.   Overall, H&H seems to be at baseline.  Nausea and vomiting seems to have resolved. Changed Protonix from IV to Oral Home Plavix and Xarelto have been resumed.  Monitor closely.   Dysphagia:  Speech therapy has advance diet to dysphagia 2 diet and thin liquids. Patient however needs to be fed and taking in very little per nursing at bedside.   Encourage p.o. intake.   History of CVA with residual left hemiplegia/spasticity and dysarthria:  CT head without acute findings. Continue home Plavix and statin. PT and OT eval evaluated and recommended SNF.  TOC on board.   History of  CAD:  Serial troponin negative.  EKG without acute ischemic finding. Continue home Plavix, Ranexa and statin.   Hypotension/history of essential hypertension:  Hypotension resolved.  Blood pressure elevated. Currently on metoprolol 50 Mg twice daily with good blood pressure  control but sinus tachycardia which has improved.   Sinus tachycardia/ Hyperthyroidism:  Clinically euvolemic.  Had sustained sinus tachycardia in the 120s-130s on 10/21.  Suspect related to underlying hyperthyroidism TFTs 10/21: TSH 1.306, free T32.4, free T41.48. Continue current metoprolol 50 Mg twice daily and may need to consider switching over to propranolol. Low suspicion for infection.  Increased methimazole from 5 mg daily to 10 mg twice daily.  Sinus tachycardia has resolved.   Sinus rhythm mostly in the 80s-90s.  Will reduce to 5 Mg twice daily to see if she does equally well with that. Outpatient follow-up with repeat TFTs in 3 to 4 weeks.  Also need to closely follow LFTs and CBCs as outpatient. Recommend outpatient endocrinology consultation or follow-up.  Do not think or know if she actually sees one currently.   Had seen an endocrinologist in 2019 but that was for her DM and pheochromocytoma follow-up.   Uncontrolled IDDM-2 with hyperglycemia:  HbA1c 8.5 on 10/20 Currently on Semglee 15 units daily, mealtime NovoLog 3 units 3 times daily and moderate SSI.   These are much lower dose insulins than her home dose, will uptitrate. 2 hypoglycemic episodes last evening in the mid 50s.  Suspect due to poor oral intake.  Discontinue mealtime NovoLog.   History of diastolic CHF:  Appears euvolemic overall.   Anxiety and depression:  - Stable -Continue home meds.   Lactic acidosis:  Likely due to dehydration.  Infectious workup unrevealing.  Resolved.  Antibiotics discontinued   Hypokalemia/hypomagnesemia/hypophosphatemia -Monitor replenish as appropriate   Pheochromocytoma, s/p right-sided unilateral adrenalectomy:   Chronic anticoagulation for DVT 2023 PTA was on prophylactic dose anticoagulation Xarelto 10 Mg daily, confirmed by pharmacy on home med rec, resumed.   Monitor closely for bleeding.  Duration of anticoagulation not clear,?  Lifelong.  Also on Plavix for CVA.   Obviously increases bleeding risk.   This needs to be closely evaluated as outpatient by her PCP.   Body mass index is 35.6 kg/m./Obesity   DVT prophylaxis: Xarelto Code Status: Full code Family Communication: No family at bed side. Disposition Plan:     Status is: Inpatient Remains inpatient appropriate because:  Remains inpatient due to ongoing poor oral intake which needs to improve for DC to next level of care.  If oral intake remains poor, may have to coordinate with patient/family regarding alternate means of nutrition.  May need goals of care discussions by palliative care team.    Consultants:  PCCM  Procedures:  Antimicrobials:  Anti-infectives (From admission, onward)    Start     Dose/Rate Route Frequency Ordered Stop   11/22/22 1400  linezolid (ZYVOX) IVPB 600 mg  Status:  Discontinued        600 mg 300 mL/hr over 60 Minutes Intravenous Every 12 hours 11/22/22 1338 11/23/22 0844   11/22/22 1400  ceFEPIme (MAXIPIME) 2 g in sodium chloride 0.9 % 100 mL IVPB  Status:  Discontinued        2 g 200 mL/hr over 30 Minutes Intravenous Every 12 hours 11/22/22 1343 11/23/22 0844   11/22/22 1345  ceFEPIme (MAXIPIME) 2 g in sodium chloride 0.9 % 100 mL IVPB  Status:  Discontinued        2 g 200  mL/hr over 30 Minutes Intravenous  Once 11/22/22 1335 11/22/22 1343   11/22/22 1345  linezolid (ZYVOX) IVPB 600 mg  Status:  Discontinued        600 mg 300 mL/hr over 60 Minutes Intravenous Every 12 hours 11/22/22 1338 11/22/22 1338   11/22/22 0215  cefTRIAXone (ROCEPHIN) 1 g in sodium chloride 0.9 % 100 mL IVPB        1 g 200 mL/hr over 30 Minutes Intravenous  Once 11/22/22 0208 11/22/22 0319   11/22/22 0215  azithromycin (ZITHROMAX) 500 mg in sodium chloride 0.9 % 250 mL IVPB        500 mg 250 mL/hr over 60 Minutes Intravenous  Once 11/22/22 0208 11/22/22 0505      Subjective: Patient was seen and examined at bedside.  Overnight events noted.   She has left upper extremity  weakness, but able to lift left leg. Her food is at the bedside but has not eaten much.  She appears deconditioned.  Objective: Vitals:   11/29/22 0500 11/29/22 0746 11/29/22 0942 11/29/22 1106  BP:  117/76  (!) 143/79  Pulse:  85  88  Resp:  16  20  Temp:  98.7 F (37.1 C)  98 F (36.7 C)  TempSrc:  Oral  Oral  SpO2:  100% 99% 100%  Weight: 90.7 kg     Height:        Intake/Output Summary (Last 24 hours) at 11/29/2022 1319 Last data filed at 11/29/2022 0600 Gross per 24 hour  Intake 240 ml  Output 1100 ml  Net -860 ml   Filed Weights   11/26/22 0500 11/28/22 0506 11/29/22 0500  Weight: 88.3 kg 89.1 kg 90.7 kg    Examination:  General exam: Appears comfortable, deconditioned, not in any acute distress. Respiratory system: CTA bilaterally. Respiratory effort normal.  RR 14 Cardiovascular system: S1 & S2 heard, RRR. No murmer. No pedal edema. Gastrointestinal system: Abdomen is non distended, soft and non tender.. Normal bowel sounds heard. Central nervous system: Alert and oriented X 3. Left UE weakness Extremities: No edema, no cyanosis, no clubbing. Skin: No rashes, lesions or ulcers Psychiatry: Judgement and insight appear normal. Mood & affect appropriate.     Data Reviewed: I have personally reviewed following labs and imaging studies  CBC: Recent Labs  Lab 11/23/22 0739 11/23/22 1626 11/24/22 0820 11/24/22 1810 11/25/22 0520 11/26/22 0508 11/27/22 0707  WBC 8.3  --  9.4  --  9.2 8.2 8.4  HGB 10.2*   < > 11.5* 11.0* 11.0* 10.2* 9.7*  HCT 31.6*   < > 34.0* 33.6* 33.5* 31.0* 29.7*  MCV 80.8  --  78.0*  --  79.0* 81.2 80.5  PLT 365  --  385  --  392 367 377   < > = values in this interval not displayed.   Basic Metabolic Panel: Recent Labs  Lab 11/23/22 0739 11/24/22 0820 11/25/22 0520 11/26/22 0508 11/27/22 0707  NA 139 135 139 137 137  K 3.4* 3.6 4.3 4.1 3.9  CL 108 104 106 106 105  CO2 20* 22 22 22 23   GLUCOSE 175* 240* 185* 146* 134*   BUN 14 8 14 16 18   CREATININE 0.89 0.77 1.16* 1.03* 1.05*  CALCIUM 8.8* 8.9 9.2 8.8* 8.9  MG 1.6* 2.0 2.1  --   --   PHOS 1.7* 3.3 3.4  --   --    GFR: Estimated Creatinine Clearance: 67.1 mL/min (A) (by C-G formula based on SCr of  1.05 mg/dL (H)). Liver Function Tests: Recent Labs  Lab 11/23/22 0739 11/24/22 0820 11/25/22 0520  AST 14* 14*  --   ALT 14 14  --   ALKPHOS 58 74  --   BILITOT 0.5 0.7  --   PROT 6.8 7.3  --   ALBUMIN 3.4* 3.4* 3.4*   No results for input(s): "LIPASE", "AMYLASE" in the last 168 hours.  No results for input(s): "AMMONIA" in the last 168 hours.  Coagulation Profile: No results for input(s): "INR", "PROTIME" in the last 168 hours.  Cardiac Enzymes: No results for input(s): "CKTOTAL", "CKMB", "CKMBINDEX", "TROPONINI" in the last 168 hours. BNP (last 3 results) No results for input(s): "PROBNP" in the last 8760 hours. HbA1C: No results for input(s): "HGBA1C" in the last 72 hours.  CBG: Recent Labs  Lab 11/28/22 0623 11/28/22 1636 11/28/22 2120 11/29/22 0633 11/29/22 1103  GLUCAP 146* 308* 171* 172* 145*   Lipid Profile: No results for input(s): "CHOL", "HDL", "LDLCALC", "TRIG", "CHOLHDL", "LDLDIRECT" in the last 72 hours. Thyroid Function Tests: No results for input(s): "TSH", "T4TOTAL", "FREET4", "T3FREE", "THYROIDAB" in the last 72 hours.  Anemia Panel: No results for input(s): "VITAMINB12", "FOLATE", "FERRITIN", "TIBC", "IRON", "RETICCTPCT" in the last 72 hours. Sepsis Labs: Recent Labs  Lab 11/23/22 0147  LATICACIDVEN 1.1    Recent Results (from the past 240 hour(s))  Blood Culture (routine x 2)     Status: None   Collection Time: 11/22/22 12:19 AM   Specimen: BLOOD RIGHT ARM  Result Value Ref Range Status   Specimen Description BLOOD RIGHT ARM  Final   Special Requests   Final    BOTTLES DRAWN AEROBIC AND ANAEROBIC Blood Culture adequate volume   Culture   Final    NO GROWTH 5 DAYS Performed at [facility]  Lab, [address], [place], [place] [address]    Report Status 11/27/2022 FINAL  Final  Resp panel by RT-PCR (RSV, Flu A&B, Covid) Anterior Nasal Swab     Status: None   Collection Time: 11/22/22 12:19 AM   Specimen: Anterior Nasal Swab  Result Value Ref Range Status   SARS Coronavirus 2 by RT PCR NEGATIVE NEGATIVE Final   Influenza A by PCR NEGATIVE NEGATIVE Final   Influenza B by PCR NEGATIVE NEGATIVE Final    Comment: (NOTE) The Xpert Xpress SARS-CoV-2/FLU/RSV plus assay is intended as an aid in the diagnosis of influenza from Nasopharyngeal swab specimens and should not be used as a sole basis for treatment. Nasal washings and aspirates are unacceptable for Xpert Xpress SARS-CoV-2/FLU/RSV testing.  Fact Sheet for Patients: BloggerCourse.com  Fact Sheet for Healthcare Providers: SeriousBroker.it  This test is not yet approved or cleared by the [place] FDA and has been authorized for detection and/or diagnosis of SARS-CoV-2 by FDA under an Emergency Use Authorization (EUA). This EUA will remain in effect (meaning this test can be used) for the duration of the COVID-19 declaration under Section 564(b)(1) of the Act, 21 U.S.C. section 360bbb-3(b)(1), unless the authorization is terminated or revoked.     Resp Syncytial Virus by PCR NEGATIVE NEGATIVE Final    Comment: (NOTE) Fact Sheet for Patients: BloggerCourse.com  Fact Sheet for Healthcare Providers: SeriousBroker.it  This test is not yet approved or cleared by the [place] FDA and has been authorized for detection and/or diagnosis of SARS-CoV-2 by FDA under an Emergency Use Authorization (EUA). This EUA will remain in effect (meaning this test can be used) for the duration of the  COVID-19 declaration under Section 564(b)(1) of the Act, 21 U.S.C. section 360bbb-3(b)(1), unless the authorization is terminated  or revoked.  Performed at [facility], [address], [place], [place] [address]   Blood Culture (routine x 2)     Status: None   Collection Time: 11/22/22  1:41 AM   Specimen: BLOOD RIGHT HAND  Result Value Ref Range Status   Specimen Description BLOOD RIGHT HAND  Final   Special Requests   Final    BOTTLES DRAWN AEROBIC ONLY Blood Culture adequate volume   Culture   Final    NO GROWTH 5 DAYS Performed at [facility] Lab, [address] [place], [place] [address]    Report Status 11/27/2022 FINAL  Final    Radiology Studies: No results found.  Scheduled Meds:  atorvastatin  80 mg Oral Daily   baclofen  10 mg Oral TID   budesonide  0.25 mg Nebulization BID   Chlorhexidine Gluconate Cloth  6 each Topical Daily   clopidogrel  75 mg Oral Daily   escitalopram  20 mg Oral q morning   ezetimibe  10 mg Oral Daily   feeding supplement  237 mL Oral BID BM   gabapentin  100 mg Oral TID   insulin aspart  0-15 Units Subcutaneous TID WC   insulin aspart  0-5 Units Subcutaneous QHS   insulin glargine-yfgn  15 Units Subcutaneous Daily   methimazole  5 mg Oral BID   metoprolol tartrate  50 mg Oral BID   pantoprazole  40 mg Oral BID AC   polyethylene glycol  17 g Oral Daily   ranolazine  500 mg Oral BID   rivaroxaban  10 mg Oral Daily   senna-docusate  2 tablet Oral QHS   sodium chloride flush  10-40 mL Intracatheter Q12H   tamsulosin  0.4 mg Oral QHS   topiramate  25 mg Oral BID   Continuous Infusions:   LOS: 7 days    Time spent: 35 Mins    Willeen Niece, MD [facility]   If 7PM-7AM, please contact night-coverage

## 2022-11-29 NOTE — Progress Notes (Signed)
OT Splint Check:   Splint doffed upon arrival, mild redness noted on the dorsum of the wrist from middle strap and pt with some complaint of mild pain in he pinky. Splint re-molded/adjusted, and straps re-applied avoiding the wrist area. New splint position protects pink from sliding off the edge; pt reports more comfort. OT to continue to follow with plans to re-assess splint after wearing during sleep tonight, please call dpt for concerns [phone]. POC remains appropriate.    11/29/22 1400  OT Visit Information  Last OT Received On 11/29/22  Assistance Needed +2  History of Present Illness 52 y.o. female presented to [facility] ED 10/17 with behavioral change, increased somnolence and nausea and vomiting. Dx Dehydration with AKI. PMH of CVA with left hemiparesis/spasticity/aphasia, IDDM-2, CAD, diastolic CHF, HTN, hyperthyroidism, anxiety and depression presenting with behavioral change, increased somnolence and nausea and vomiting.  Precautions  Precautions Fall  Precaution Comments chronic L side weakness 2* CVA  Restrictions  Weight Bearing Restrictions No  Other Position/Activity Restrictions L sided weakness  Pain Assessment  Pain Assessment Faces  Faces Pain Scale 2  Pain Location L pinky finger  Pain Descriptors / Indicators Discomfort;Grimacing  Pain Intervention(s) Limited activity within patient's tolerance;Monitored during session  Cognition  Arousal Alert  Behavior During Therapy Flat affect  Overall Cognitive Status No family/caregiver present to determine baseline cognitive functioning  General Comments unable to recall reason/education for splint provided at session prior. limited insight.  Upper Extremity Assessment  Upper Extremity Assessment LUE deficits/detail  LUE Deficits / Details MPs limited to ~20* from full extension, hard end feel. Pt reports significant pain in this position. Wrist  rests in full extension, able to move into flexion with PROM/stretching. IPs PROM  are [facility] - pt seen for resting hand splint, splint donned with fair positioning. Pt reports mild "stretching pain" at her pinky  General Comments  General comments (skin integrity, edema, etc.) VSS. splint doffed, skin and joints assessed. Pt with report of pain at her pinky finger, mild redness noted at middle stap going across wrist.  OT - End of Session  Patient left in bed;with bed alarm set;with call bell/phone within reach;with family/visitor present  Nurse Communication Mobility status  OT Assessment/Plan  OT Visit Diagnosis Other abnormalities of gait and mobility (R26.89);Muscle weakness (generalized) (M62.81);Other symptoms and signs involving the nervous system (R29.898)  OT Frequency (ACUTE ONLY) Min 1X/week  Follow Up Recommendations Skilled nursing-short term rehab (<3 hours/day)  Patient can return home with the following Two people to help with walking and/or transfers;A lot of help with bathing/dressing/bathroom;Assist for transportation;Help with stairs or ramp for entrance;Supervision due to cognitive status  OT Equipment Other (comment)  AM-PAC OT "6 Clicks" Daily Activity Outcome Measure (Version 2)  Help from another person eating meals? 2  Help from another person taking care of personal grooming? 2  Help from another person toileting, which includes using toliet, bedpan, or urinal? 1  Help from another person bathing (including washing, rinsing, drying)? 2  Help from another person to put on and taking off regular upper body clothing? 2  Help from another person to put on and taking off regular lower body clothing? 2  6 Click Score 11  Progressive Mobility  What is the highest level of mobility based on the progressive mobility assessment? Level 2 (Chairfast) - Balance while sitting on edge of bed and cannot stand  OT Goal Progression  Progress towards OT goals Progressing toward goals  Acute Rehab OT Goals  Patient Stated Goal to see family  OT Goal Formulation With  patient  Time For Goal Achievement 12/08/22  Potential to Achieve Goals Fair  ADL Goals  Pt Will Perform Grooming with set-up;sitting  Pt Will Transfer to Toilet with min assist;ambulating  Pt Will Perform Toileting - Clothing Manipulation and hygiene with min assist;sit to/from stand  Pt/caregiver will Perform Home Exercise Program Left upper extremity;Independently  Additional ADL Goal #1 pt will indep recall splint wear schedule to promote joint integrity  OT Time Calculation  OT Start Time (ACUTE ONLY) 1300  OT Stop Time (ACUTE ONLY) 1318  OT Time Calculation (min) 18 min  OT General Charges  $OT Visit 1 Visit  OT Treatments  $Orthotics/Prosthetics Check 8-22 mins   Derenda Mis, OTR/L Acute Rehabilitation Services Office [phone] Secure Chat Communication Preferred

## 2022-11-29 NOTE — Inpatient Diabetes Management (Signed)
Inpatient Diabetes Program Recommendations  AACE/ADA: New Consensus Statement on Inpatient Glycemic Control (2015)  Target Ranges:  Prepandial:   less than 140 mg/dL      Peak postprandial:   less than 180 mg/dL (1-2 hours)      Critically ill patients:  140 - 180 mg/dL   Lab Results  Component Value Date   GLUCAP 172 (H) 11/29/2022   HGBA1C 8.5 (H) 11/24/2022    Review of Glycemic Control  Latest Reference Range & Units 11/28/22 06:23 11/28/22 16:36 11/28/22 21:20 11/29/22 06:33  Glucose-Capillary 70 - 99 mg/dL 478 (H) 295 (H) 621 (H) 172 (H)  (H): Data is abnormally high Diabetes history: Type 2 DM Outpatient Diabetes medications: Novolog 15 units TID, Lantus 40 units at bedtime, Ozempic 0.5 mg qwk Current orders for Inpatient glycemic control: Novolog 0-15 units TID, Novolog 0-5 units at bedtime, Semglee 15 units QD   Inpatient Diabetes Program Recommendations:     Consider increasing Semglee 18 units QD  Thanks, Lujean Rave, MSN, RNC-OB Diabetes Coordinator [phone] (8a-5p)

## 2022-11-29 NOTE — Progress Notes (Signed)
Occupational Therapy Splint  Patient Details Name: Lori [name] MRN: [id number] DOB: [birth year] Today's Date: 11/29/2022   History of present illness 52 y.o. female presented to [facility] ED 10/17 with behavioral change, increased somnolence and nausea and vomiting. Dx Dehydration with AKI. PMH of CVA with left hemiparesis/spasticity/aphasia, IDDM-2, CAD, diastolic CHF, HTN, hyperthyroidism, anxiety and depression presenting with behavioral change, increased somnolence and nausea and vomiting.   OT comments  Pt seen to address L distal UE splinting needs. LUE PROM completed as a precursor to splinting. Pre-fab splint adjusted to meet pt's ROM needs and straps adjusted as needed. Pt with report of mild "stretching" pain at her pinky in the splint. Educated pt to remove splint if it becomes uncomfortable. OT to return within 2 hours for splint check and to set splinting schedule. Anticipate maintain splint at night, and don/doff every 4 hours during day time hours. Goal added, see care plan.       If plan is discharge home, recommend the following:  Two people to help with walking and/or transfers;A lot of help with bathing/dressing/bathroom;Assist for transportation;Help with stairs or ramp for entrance;Supervision due to cognitive status   Equipment Recommendations  Other (comment)       Precautions / Restrictions Precautions Precautions: Fall Precaution Comments: chronic L side weakness 2* CVA Restrictions Weight Bearing Restrictions: No Other Position/Activity Restrictions: L sided weakness              ADL either performed or assessed with clinical judgement    Extremity/Trunk Assessment Upper Extremity Assessment Upper Extremity Assessment: LUE deficits/detail LUE Deficits / Details: MPs limited to ~20* from full extension, hard end feel. Pt reports significant pain in this position. Wrist  rests in full extension, able to move into flexion with PROM/stretching. IPs PROM are  [facility] - pt seen for resting hand splint, splint donned with fair positioning. Pt reports mild "stretching pain" at her pinky LUE Sensation: decreased light touch;decreased proprioception LUE Coordination: decreased fine motor;decreased gross motor   Lower Extremity Assessment Lower Extremity Assessment: Defer to PT evaluation         Cognition Arousal: Alert Behavior During Therapy: Flat affect Overall Cognitive Status: No family/caregiver present to determine baseline cognitive functioning           General Comments: follows simple commands but needs increased time for all processing        Exercises Other Exercises Other Exercises: PROM of LUE to hard end feel or pain       General Comments VSS. Splint donned, OT to return within 2 hours for splint check. Splitn schedule posted in her room.    Pertinent Vitals/ Pain       Pain Assessment Pain Assessment: Faces Faces Pain Scale: Hurts a little bit Pain Location: L hand with PROM Pain Descriptors / Indicators: Discomfort, Grimacing Pain Intervention(s): Limited activity within patient's tolerance, Monitored during session   Frequency  Min 1X/week        Progress Toward Goals  OT Goals(current goals can now be found in the care plan section)  Progress towards OT goals: Progressing toward goals  Acute Rehab OT Goals Patient Stated Goal: to get rest OT Goal Formulation: With patient Time For Goal Achievement: 12/08/22 Potential to Achieve Goals: Fair ADL Goals Pt Will Perform Grooming: with set-up;sitting Pt Will Transfer to Toilet: with min assist;ambulating Pt Will Perform Toileting - Clothing Manipulation and hygiene: with min assist;sit to/from stand Pt/caregiver will Perform Home Exercise Program: Left upper extremity;Independently Additional  ADL Goal #1: pt will indep recall splint wear schedule to promote joint integrity   AM-PAC OT "6 Clicks" Daily Activity     Outcome Measure   Help from another  person eating meals?: A Lot Help from another person taking care of personal grooming?: A Lot Help from another person toileting, which includes using toliet, bedpan, or urinal?: Total Help from another person bathing (including washing, rinsing, drying)?: A Lot Help from another person to put on and taking off regular upper body clothing?: A Lot Help from another person to put on and taking off regular lower body clothing?: A Lot 6 Click Score: 11    End of Session    OT Visit Diagnosis: Other abnormalities of gait and mobility (R26.89);Muscle weakness (generalized) (M62.81);Other symptoms and signs involving the nervous system (R29.898)   Activity Tolerance Patient tolerated treatment well   Patient Left in bed;with bed alarm set;with call bell/phone within reach;with family/visitor present   Nurse Communication Mobility status        Time: 1050-1120 OT Time Calculation (min): 30 min  Charges: OT General Charges $OT Visit: 1 Visit OT Treatments $Therapeutic Activity: 8-22 mins $Orthotics Fit/Training: 8-22 mins $ OT Supplies: 1 Supply  Derenda Mis, OTR/L Acute Rehabilitation Services Office [phone] Secure Chat Communication Preferred   Donia Pounds 11/29/2022, 11:30 AM

## 2022-11-29 NOTE — Plan of Care (Signed)
  Problem: Skin Integrity: Goal: Risk for impaired skin integrity will decrease Outcome: Progressing   Problem: Education: Goal: Knowledge of General Education information will improve Description: Including pain rating scale, medication(s)/side effects and non-pharmacologic comfort measures Outcome: Progressing   Problem: Clinical Measurements: Goal: Respiratory complications will improve Outcome: Progressing Goal: Cardiovascular complication will be avoided Outcome: Progressing   Problem: Activity: Goal: Risk for activity intolerance will decrease Outcome: Progressing   Problem: Elimination: Goal: Will not experience complications related to bowel motility Outcome: Progressing Goal: Will not experience complications related to urinary retention Outcome: Progressing

## 2022-11-30 DIAGNOSIS — G9341 Metabolic encephalopathy: Secondary | ICD-10-CM | POA: Diagnosis not present

## 2022-11-30 LAB — GLUCOSE, CAPILLARY
Glucose-Capillary: 196 mg/dL — ABNORMAL HIGH (ref 70–99)
Glucose-Capillary: 289 mg/dL — ABNORMAL HIGH (ref 70–99)
Glucose-Capillary: 266 mg/dL — ABNORMAL HIGH (ref 70–99)
Glucose-Capillary: 214 mg/dL — ABNORMAL HIGH (ref 70–99)

## 2022-11-30 NOTE — Plan of Care (Signed)

## 2022-11-30 NOTE — Progress Notes (Signed)
PROGRESS NOTE    Lori [name]  HYQ:[id number] DOB: 1970-07-20 DOA: 11/21/2022 PCP: Caesar Bookman, NP   Brief Narrative: This 52 year old female, medical history significant for CVA with residual left hemiparesis/spasticity/dysphagia, DM2/IDDM, CAD, chronic diastolic CHF, HTN, anxiety/depression, pheochromocytoma s/p unilateral adrenalectomy in 2018, hospitalization 9/29 - 10/3 for acute metabolic encephalopathy, presented to [facility] ED on 10/17 with altered mental status, nausea and vomiting. She was admitted for acute metabolic encephalopathy, AKI, intractable nausea and vomiting, hematemesis and tachycardia.  Postadmission on 10/18, became hypotensive with SBP in the 70s that recovered after IV fluid boluses.  Nausea, vomiting, dehydration and AKI resolved.  Mental status has improved but not clear what is her baseline.  Ongoing poor oral intake.  Sinus tachycardia suspected due to hyperthyroid.  Ongoing poor oral intake.  Also awaiting SNF.   Assessment & Plan:   Principal Problem:   Acute metabolic encephalopathy Active Problems:   Nausea & vomiting   AKI (acute kidney injury) (HCC)   History of CVA with spastic paresis (cerebrovascular accident)   Hyperthyroidism   Essential hypertension   Insulin dependent type 2 diabetes mellitus (HCC)   Left hemiplegia (HCC)   History of CAD (coronary artery disease)   Chronic heart failure with preserved ejection fraction (HFpEF) (HCC)   Chronic anticoagulation   Tachycardia   Anxiety and depression   Aphasia   Debility   Hyponatremia   Hypotension   Delirium   Dehydration  Acute metabolic encephalopathy/agitation:  Likely due to dehydration and polypharmacy.   No clear source of infection.   CT head and ABG without acute finding.   Encephalopathy seems to have resolved.  Might have some behavioral issues.   Baseline mental status is not clear and may need to elicit this from family.  Minimize opioids and sedative meds.  Delirium  precautions.  Ongoing reorientation. Mental status Improving   Dehydration, nausea and vomiting:  Now resolved. CT chest, abdomen and pelvis without acute findings.  Abdominal exam benign.  Diet has been advanced to dysphagia 2 diet but per nursing, not really eating much.   This will be increased risk for recurrent dehydration. Only 10% intake at each meal has been documented.   Registered dietitian consulted for nutritional supplements.   If oral intake does not improve, then we will have to discuss with patient/spouse regarding alternate feeding options i.e. tube feeding.   Encouraged p.o. intake.   Acute Kidney Injury:  Likely due to the above.   Baseline Cr ~0.9-1.0.   AKI resolved but remains at risk for recurrent AKI due to poor oral intake. Push fluids orally.  Patient appears to be drinking well.  Nutritional shakes may help.   Positive gastric Hemoccult: Likely from Mallory-Weiss tear related to intractable nausea and vomiting, but cannot rule out gastritis or PUD.   Slight drop in Hgb likely dilutional from IV fluids.   Overall, H&H seems to be at baseline.  Nausea and vomiting seems to have resolved. Changed Protonix from IV to Oral Home Plavix and Xarelto have been resumed.  Monitor closely.   Dysphagia:  Speech therapy has advance diet to dysphagia 2 diet and thin liquids. Patient however needs to be fed and taking in very little per nursing at bedside.   Encourage p.o. intake.   History of CVA with residual left hemiplegia/spasticity and dysarthria:  CT head without acute findings. Continue home Plavix and statin. PT and OT eval evaluated and recommended SNF.  TOC on board.   History of  CAD:  Serial troponin negative. EKG without acute ischemic finding. Continue home Plavix, Ranexa and statin.   Hypotension/history of essential hypertension:  Hypotension resolved.  Blood pressure elevated. Currently on metoprolol 50 Mg twice daily with good blood pressure  control but sinus tachycardia which has improved.   Sinus tachycardia/ Hyperthyroidism:  Clinically euvolemic.  Had sustained sinus tachycardia in the 120s-130s on 10/21.  Suspect related to underlying hyperthyroidism TFTs 10/21: TSH 1.306, free T32.4, free T41.48. Continue current metoprolol 50 Mg twice daily and may need to consider switching over to propranolol. Low suspicion for infection.  Increased methimazole from 5 mg daily to 10 mg twice daily.  Sinus tachycardia has resolved.   Sinus rhythm mostly in the 80s-90s.  Will reduce to 5 Mg twice daily to see if she does equally well with that. Outpatient follow-up with repeat TFTs in 3 to 4 weeks.  Also need to closely follow LFTs and CBCs as outpatient. Recommend outpatient endocrinology consultation or follow-up.  Do not think or know if she actually sees one currently.   Had seen an endocrinologist in 2019 but that was for her DM and pheochromocytoma follow-up.   Uncontrolled IDDM-2 with hyperglycemia:  HbA1c 8.5 on 10/20 Currently on Semglee 15 units daily, mealtime NovoLog 3 units 3 times daily and moderate SSI.   These are much lower dose insulins than her home dose, will uptitrate. 2 hypoglycemic episodes last evening in the mid 50s.  Suspect due to poor oral intake.  Discontinue mealtime NovoLog.   History of diastolic CHF:  Appears euvolemic overall.   Anxiety and depression:  - Stable -Continue home meds.   Lactic acidosis:  Likely due to dehydration.  Infectious workup unrevealing.  Resolved.  Antibiotics discontinued   Hypokalemia/hypomagnesemia/hypophosphatemia -Monitor replenish as appropriate   Pheochromocytoma, s/p right-sided unilateral adrenalectomy:   Chronic anticoagulation for DVT 2023 PTA was on prophylactic dose anticoagulation Xarelto 10 Mg daily, confirmed by pharmacy on home med rec, resumed.   Monitor closely for bleeding.  Duration of anticoagulation not clear,?  Lifelong.  Also on Plavix for CVA.   Obviously increases bleeding risk.   This needs to be closely evaluated as outpatient by her PCP.   Body mass index is 35.6 kg/m./Obesity   DVT prophylaxis: Xarelto Code Status: Full code Family Communication: No family at bed side. Disposition Plan:     Status is: Inpatient Remains inpatient appropriate because:  Remains inpatient due to ongoing poor oral intake which needs to improve for DC to next level of care.  If oral intake remains poor, may have to coordinate with patient/family regarding alternate means of nutrition.  May need goals of care discussions by palliative care team.    Consultants:  PCCM  Procedures:  Antimicrobials:  Anti-infectives (From admission, onward)    Start     Dose/Rate Route Frequency Ordered Stop   11/22/22 1400  linezolid (ZYVOX) IVPB 600 mg  Status:  Discontinued        600 mg 300 mL/hr over 60 Minutes Intravenous Every 12 hours 11/22/22 1338 11/23/22 0844   11/22/22 1400  ceFEPIme (MAXIPIME) 2 g in sodium chloride 0.9 % 100 mL IVPB  Status:  Discontinued        2 g 200 mL/hr over 30 Minutes Intravenous Every 12 hours 11/22/22 1343 11/23/22 0844   11/22/22 1345  ceFEPIme (MAXIPIME) 2 g in sodium chloride 0.9 % 100 mL IVPB  Status:  Discontinued        2 g 200 mL/hr  over 30 Minutes Intravenous  Once 11/22/22 1335 11/22/22 1343   11/22/22 1345  linezolid (ZYVOX) IVPB 600 mg  Status:  Discontinued        600 mg 300 mL/hr over 60 Minutes Intravenous Every 12 hours 11/22/22 1338 11/22/22 1338   11/22/22 0215  cefTRIAXone (ROCEPHIN) 1 g in sodium chloride 0.9 % 100 mL IVPB        1 g 200 mL/hr over 30 Minutes Intravenous  Once 11/22/22 0208 11/22/22 0319   11/22/22 0215  azithromycin (ZITHROMAX) 500 mg in sodium chloride 0.9 % 250 mL IVPB        500 mg 250 mL/hr over 60 Minutes Intravenous  Once 11/22/22 0208 11/22/22 0505      Subjective: Patient was seen and examined at bedside.  Overnight events noted.   She has left upper extremity  weakness but able to lift left leg. She appears deconditioned. Her oral intake is very minimal.  Objective: Vitals:   11/30/22 0508 11/30/22 0743 11/30/22 0756 11/30/22 1116  BP:  98/67  (!) 101/58  Pulse:  79 78 77  Resp:  18 18 17   Temp:  97.6 F (36.4 C)  97.9 F (36.6 C)  TempSrc:  Axillary  Oral  SpO2:  100% 100% 100%  Weight: 92.8 kg     Height:        Intake/Output Summary (Last 24 hours) at 11/30/2022 1228 Last data filed at 11/30/2022 1000 Gross per 24 hour  Intake 540 ml  Output 750 ml  Net -210 ml   Filed Weights   11/28/22 0506 11/29/22 0500 11/30/22 0508  Weight: 89.1 kg 90.7 kg 92.8 kg    Examination:  General exam: Appears comfortable, deconditioned, not in any acute distress. Respiratory system: CTA bilaterally. Respiratory effort normal.  RR 13 Cardiovascular system: S1 & S2 heard, RRR. No murmer. No pedal edema. Gastrointestinal system: Abdomen is non distended, soft and non tender. Normal bowel sounds heard. Central nervous system: Alert and oriented X 3. Left UE weakness/ Swelling Extremities: No edema, no cyanosis, no clubbing. Skin: No rashes, lesions or ulcers Psychiatry: Judgement and insight appear normal. Mood & affect appropriate.     Data Reviewed: I have personally reviewed following labs and imaging studies  CBC: Recent Labs  Lab 11/24/22 0820 11/24/22 1810 11/25/22 0520 11/26/22 0508 11/27/22 0707  WBC 9.4  --  9.2 8.2 8.4  HGB 11.5* 11.0* 11.0* 10.2* 9.7*  HCT 34.0* 33.6* 33.5* 31.0* 29.7*  MCV 78.0*  --  79.0* 81.2 80.5  PLT 385  --  392 367 377   Basic Metabolic Panel: Recent Labs  Lab 11/24/22 0820 11/25/22 0520 11/26/22 0508 11/27/22 0707  NA 135 139 137 137  K 3.6 4.3 4.1 3.9  CL 104 106 106 105  CO2 22 22 22 23   GLUCOSE 240* 185* 146* 134*  BUN 8 14 16 18   CREATININE 0.77 1.16* 1.03* 1.05*  CALCIUM 8.9 9.2 8.8* 8.9  MG 2.0 2.1  --   --   PHOS 3.3 3.4  --   --    GFR: Estimated Creatinine Clearance: 68  mL/min (A) (by C-G formula based on SCr of 1.05 mg/dL (H)). Liver Function Tests: Recent Labs  Lab 11/24/22 0820 11/25/22 0520  AST 14*  --   ALT 14  --   ALKPHOS 74  --   BILITOT 0.7  --   PROT 7.3  --   ALBUMIN 3.4* 3.4*   No results for input(s): "LIPASE", "  AMYLASE" in the last 168 hours.  No results for input(s): "AMMONIA" in the last 168 hours.  Coagulation Profile: No results for input(s): "INR", "PROTIME" in the last 168 hours.  Cardiac Enzymes: No results for input(s): "CKTOTAL", "CKMB", "CKMBINDEX", "TROPONINI" in the last 168 hours. BNP (last 3 results) No results for input(s): "PROBNP" in the last 8760 hours. HbA1C: No results for input(s): "HGBA1C" in the last 72 hours.  CBG: Recent Labs  Lab 11/29/22 1103 11/29/22 1619 11/29/22 2110 11/30/22 0620 11/30/22 1216  GLUCAP 145* 278* 340* 196* 289*   Lipid Profile: No results for input(s): "CHOL", "HDL", "LDLCALC", "TRIG", "CHOLHDL", "LDLDIRECT" in the last 72 hours. Thyroid Function Tests: No results for input(s): "TSH", "T4TOTAL", "FREET4", "T3FREE", "THYROIDAB" in the last 72 hours.  Anemia Panel: No results for input(s): "VITAMINB12", "FOLATE", "FERRITIN", "TIBC", "IRON", "RETICCTPCT" in the last 72 hours. Sepsis Labs: No results for input(s): "PROCALCITON", "LATICACIDVEN" in the last 168 hours.   Recent Results (from the past 240 hour(s))  Blood Culture (routine x 2)     Status: None   Collection Time: 11/22/22 12:19 AM   Specimen: BLOOD RIGHT ARM  Result Value Ref Range Status   Specimen Description BLOOD RIGHT ARM  Final   Special Requests   Final    BOTTLES DRAWN AEROBIC AND ANAEROBIC Blood Culture adequate volume   Culture   Final    NO GROWTH 5 DAYS Performed at [facility], [address] [place], [place] [address]    Report Status 11/27/2022 FINAL  Final  Resp panel by RT-PCR (RSV, Flu A&B, Covid) Anterior Nasal Swab     Status: None   Collection Time: 11/22/22 12:19 AM    Specimen: Anterior Nasal Swab  Result Value Ref Range Status   SARS Coronavirus 2 by RT PCR NEGATIVE NEGATIVE Final   Influenza A by PCR NEGATIVE NEGATIVE Final   Influenza B by PCR NEGATIVE NEGATIVE Final    Comment: (NOTE) The Xpert Xpress SARS-CoV-2/FLU/RSV plus assay is intended as an aid in the diagnosis of influenza from Nasopharyngeal swab specimens and should not be used as a sole basis for treatment. Nasal washings and aspirates are unacceptable for Xpert Xpress SARS-CoV-2/FLU/RSV testing.  Fact Sheet for Patients: BloggerCourse.com  Fact Sheet for Healthcare Providers: SeriousBroker.it  This test is not yet approved or cleared by the [place] FDA and has been authorized for detection and/or diagnosis of SARS-CoV-2 by FDA under an Emergency Use Authorization (EUA). This EUA will remain in effect (meaning this test can be used) for the duration of the COVID-19 declaration under Section 564(b)(1) of the Act, 21 U.S.C. section 360bbb-3(b)(1), unless the authorization is terminated or revoked.     Resp Syncytial Virus by PCR NEGATIVE NEGATIVE Final    Comment: (NOTE) Fact Sheet for Patients: BloggerCourse.com  Fact Sheet for Healthcare Providers: SeriousBroker.it  This test is not yet approved or cleared by the [place] FDA and has been authorized for detection and/or diagnosis of SARS-CoV-2 by FDA under an Emergency Use Authorization (EUA). This EUA will remain in effect (meaning this test can be used) for the duration of the COVID-19 declaration under Section 564(b)(1) of the Act, 21 U.S.C. section 360bbb-3(b)(1), unless the authorization is terminated or revoked.  Performed at [facility], [address], [place], [place] [address]   Blood Culture (routine x 2)     Status: None   Collection Time: 11/22/22  1:41 AM   Specimen: BLOOD RIGHT HAND  Result Value Ref Range Status   Specimen Description BLOOD RIGHT HAND  Final   Special Requests   Final    BOTTLES DRAWN AEROBIC ONLY Blood Culture adequate volume   Culture   Final    NO GROWTH 5 DAYS Performed at [facility], [address] [place], [place] [address]    Report Status 11/27/2022 FINAL  Final    Radiology Studies: No results found.  Scheduled Meds:  atorvastatin  80 mg Oral Daily   baclofen  10 mg Oral TID   budesonide  0.25 mg Nebulization BID   Chlorhexidine Gluconate Cloth  6 each Topical Daily   clopidogrel  75 mg Oral Daily   escitalopram  20 mg Oral q morning   ezetimibe  10 mg Oral Daily   feeding supplement  237 mL Oral BID BM   gabapentin  100 mg Oral TID   insulin aspart  0-15 Units Subcutaneous TID WC   insulin aspart  0-5 Units Subcutaneous QHS   insulin glargine-yfgn  18 Units Subcutaneous Daily   methimazole  5 mg Oral BID   metoprolol tartrate  50 mg Oral BID   pantoprazole  40 mg Oral BID AC   polyethylene glycol  17 g Oral Daily   ranolazine  500 mg Oral BID   rivaroxaban  10 mg Oral Daily   senna-docusate  2 tablet Oral QHS   sodium chloride flush  10-40 mL Intracatheter Q12H   tamsulosin  0.4 mg Oral QHS   topiramate  25 mg Oral BID   Continuous Infusions:   LOS: 8 days    Time spent: 35 Mins    Willeen Niece, MD [facility]   If 7PM-7AM, please contact night-coverage

## 2022-11-30 NOTE — Progress Notes (Signed)
Occupational Therapy Treatment Patient Details Name: Lori [name] MRN: [id number] DOB: Apr 27, 1970 Today's Date: 11/30/2022   History of present illness 52 y.o. female presented to [facility] ED 10/17 with behavioral change, increased somnolence and nausea and vomiting. Dx Dehydration with AKI. PMH of CVA with left hemiparesis/spasticity/aphasia, IDDM-2, CAD, diastolic CHF, HTN, hyperthyroidism, anxiety and depression presenting with behavioral change, increased somnolence and nausea and vomiting.   OT comments  Pt. Seen for skilled OT treatment session.  Intermittent assistance for organization and positioning of breakfast tray for ease with self feeding.  Gentle ROM LUE/hand prior to donning splint.  Splint check approx. 2.5 hrs. Later. Splint was no longer in original positioning sliding up her forearm secondary to increased tone of all digits appearing to have pulled them out of the finger dividers.  Removed and skin checked performed with no issues noted.  Rn aware of previously established wear schedule and will place back on later today.        If plan is discharge home, recommend the following:  Two people to help with walking and/or transfers;A lot of help with bathing/dressing/bathroom;Assist for transportation;Help with stairs or ramp for entrance;Supervision due to cognitive status   Equipment Recommendations  Other (comment)    Recommendations for Other Services PT consult;Speech consult    Precautions / Restrictions Precautions Precautions: Fall Precaution Comments: chronic L side weakness 2* CVA Restrictions Other Position/Activity Restrictions: L sided weakness       Mobility Bed Mobility                    Transfers                         Balance                                           ADL either performed or assessed with clinical judgement   ADL Overall ADL's : Needs assistance/impaired Eating/Feeding: Set  up;Sitting Eating/Feeding Details (indicate cue type and reason): sitting upright in bed-does not eat pork secondary to religious beliefs. had pork sausage on plate and it touched her spoon and she could no longer use the spoon. rn notified so necessary changes could be made to her dietary orders                                        Extremity/Trunk Assessment              Vision       Perception     Praxis      Cognition Arousal: Lethargic Behavior During Therapy: Flat affect Overall Cognitive Status: No family/caregiver present to determine baseline cognitive functioning                         Following Commands: Follows one step commands consistently       General Comments: unable to recall reason/education for splint provided at session prior. limited insight.        Exercises General Exercises - Upper Extremity Wrist Flexion: AAROM, Left, 5 reps, Seated Wrist Extension: AAROM, Left, 5 reps, Seated Digit Composite Flexion: AROM, Left, 5 reps, Seated Composite Extension: AAROM, Left, 5 reps, Seated Other Exercises Other Exercises: PROM to LUE  prior to donning splint, pt. reports she did not wear the prior night stating staff did not know how to don, reviewed with pt. that the wearing sch./inst./ and photo were above her head on wall for future reference.  splint donned and returned 2.5 hours later and it was slid off going up the forearm. no skin issues noted. removed splint and will encourage it to be put back on following indicated wear sch. previously est.    Shoulder Instructions       General Comments      Pertinent Vitals/ Pain       Pain Assessment Pain Assessment: No/denies pain  Home Living                                          Prior Functioning/Environment              Frequency  Min 1X/week        Progress Toward Goals  OT Goals(current goals can now be found in the care plan  section)  Progress towards OT goals: Progressing toward goals     Plan      Co-evaluation                 AM-PAC OT "6 Clicks" Daily Activity     Outcome Measure   Help from another person eating meals?: A Lot Help from another person taking care of personal grooming?: A Lot Help from another person toileting, which includes using toliet, bedpan, or urinal?: Total Help from another person bathing (including washing, rinsing, drying)?: A Lot Help from another person to put on and taking off regular upper body clothing?: A Lot Help from another person to put on and taking off regular lower body clothing?: A Lot 6 Click Score: 11    End of Session    OT Visit Diagnosis: Other abnormalities of gait and mobility (R26.89);Muscle weakness (generalized) (M62.81);Other symptoms and signs involving the nervous system (R29.898)   Activity Tolerance Patient tolerated treatment well   Patient Left in bed;with bed alarm set;with call bell/phone within reach   Nurse Communication Other (comment) (alerted rn of pts need to have no pork on any of her meal trays. secure chat also reminding of splint wearing sch.)        Time: 1610-9604 OT Time Calculation (min): 16 min  Charges: OT General Charges $OT Visit: 1 Visit OT Treatments $Therapeutic Activity: 8-22 mins  .  Alessandra Bevels Lorraine-COTA/L 11/30/2022, 12:07 PM

## 2022-11-30 NOTE — Plan of Care (Signed)
  Problem: Skin Integrity: Goal: Risk for impaired skin integrity will decrease Outcome: Progressing   Problem: Education: Goal: Knowledge of General Education information will improve Description: Including pain rating scale, medication(s)/side effects and non-pharmacologic comfort measures Outcome: Progressing   Problem: Clinical Measurements: Goal: Respiratory complications will improve Outcome: Progressing Goal: Cardiovascular complication will be avoided Outcome: Progressing   Problem: Coping: Goal: Level of anxiety will decrease Outcome: Progressing   Problem: Elimination: Goal: Will not experience complications related to bowel motility Outcome: Progressing Goal: Will not experience complications related to urinary retention Outcome: Progressing

## 2022-12-01 DIAGNOSIS — G9341 Metabolic encephalopathy: Secondary | ICD-10-CM | POA: Diagnosis not present

## 2022-12-01 LAB — GLUCOSE, CAPILLARY
Glucose-Capillary: 211 mg/dL — ABNORMAL HIGH (ref 70–99)
Glucose-Capillary: 304 mg/dL — ABNORMAL HIGH (ref 70–99)
Glucose-Capillary: 207 mg/dL — ABNORMAL HIGH (ref 70–99)
Glucose-Capillary: 307 mg/dL — ABNORMAL HIGH (ref 70–99)

## 2022-12-01 MED ORDER — INSULIN GLARGINE-YFGN 100 UNIT/ML ~~LOC~~ SOLN
22.0000 [IU] | Freq: Every day | SUBCUTANEOUS | Status: DC
  Administered 2022-12-02 – 2022-12-06 (×5): 22 [IU] via SUBCUTANEOUS
  Filled 2022-12-01 (×5): qty 0.22

## 2022-12-01 NOTE — Plan of Care (Signed)
  Problem: Skin Integrity: Goal: Risk for impaired skin integrity will decrease Outcome: Progressing   Problem: Clinical Measurements: Goal: Respiratory complications will improve Outcome: Progressing Goal: Cardiovascular complication will be avoided Outcome: Progressing   Problem: Nutrition: Goal: Adequate nutrition will be maintained Outcome: Progressing   Problem: Coping: Goal: Level of anxiety will decrease Outcome: Progressing   Problem: Elimination: Goal: Will not experience complications related to bowel motility Outcome: Progressing Goal: Will not experience complications related to urinary retention Outcome: Progressing   Problem: Skin Integrity: Goal: Risk for impaired skin integrity will decrease Outcome: Progressing

## 2022-12-01 NOTE — Progress Notes (Signed)
PROGRESS NOTE    Lori [name]  JXB:[id number] DOB: 14-Dec-1970 DOA: 11/21/2022 PCP: Caesar Bookman, NP   Brief Narrative: This 52 year old female, medical history significant for CVA with residual left hemiparesis/spasticity/dysphagia, DM2/IDDM, CAD, chronic diastolic CHF, HTN, anxiety/depression, pheochromocytoma s/p unilateral adrenalectomy in 2018, hospitalization 9/29 - 10/3 for acute metabolic encephalopathy, presented to [facility] ED on 10/17 with altered mental status, nausea and vomiting. She was admitted for acute metabolic encephalopathy, AKI, intractable nausea and vomiting, hematemesis and tachycardia.  Postadmission on 10/18, became hypotensive with SBP in the 70s that recovered after IV fluid boluses.  Nausea, vomiting, dehydration and AKI resolved. Mental status has improved but not clear what is her baseline.  Ongoing poor oral intake.  Sinus tachycardia suspected due to hyperthyroid.  Ongoing poor oral intake.  Also awaiting SNF.   Assessment & Plan:   Principal Problem:   Acute metabolic encephalopathy Active Problems:   Nausea & vomiting   AKI (acute kidney injury) (HCC)   History of CVA with spastic paresis (cerebrovascular accident)   Hyperthyroidism   Essential hypertension   Insulin dependent type 2 diabetes mellitus (HCC)   Left hemiplegia (HCC)   History of CAD (coronary artery disease)   Chronic heart failure with preserved ejection fraction (HFpEF) (HCC)   Chronic anticoagulation   Tachycardia   Anxiety and depression   Aphasia   Debility   Hyponatremia   Hypotension   Delirium   Dehydration  Acute metabolic encephalopathy/agitation:  Likely due to dehydration and polypharmacy.   No clear source of infection.   CT head and ABG without acute finding.   Encephalopathy seems to have resolved.  Might have some behavioral issues.   Baseline mental status is not clear and may need to elicit this from family.  Minimize opioids and sedative meds.  Delirium  precautions.  Ongoing reorientation. Mental status Improving   Dehydration, nausea and vomiting:  Now resolved. CT chest, abdomen and pelvis without acute findings.  Abdominal exam benign.  Diet has been advanced to dysphagia 2 diet but per nursing, not really eating much.   This will be increased risk for recurrent dehydration. Only 10% intake at each meal has been documented.   Registered dietitian consulted for nutritional supplements.   If oral intake does not improve, then we will have to discuss with patient/spouse regarding alternate feeding options i.e. tube feeding.   Encouraged p.o. intake.   Acute Kidney Injury:  Likely due to the above.   Baseline Cr ~0.9-1.0.   AKI resolved but remains at risk for recurrent AKI due to poor oral intake. Push fluids orally.  Patient appears to be drinking well.  Nutritional shakes may help.   Positive gastric Hemoccult: Likely from Mallory-Weiss tear related to intractable nausea and vomiting, but cannot rule out gastritis or PUD.   Slight drop in Hgb likely dilutional from IV fluids.   Overall, H&H seems to be at baseline. Nausea and vomiting seems to have resolved. Changed Protonix from IV to Oral Protonix. Home Plavix and Xarelto have been resumed.  Monitor closely.   Dysphagia:  Speech therapy has advance diet to dysphagia 2 diet and thin liquids. Patient however needs to be fed and taking in very little per nursing at bedside.   Encourage p.o. intake.   History of CVA with residual left hemiplegia/spasticity and dysarthria:  CT head without acute findings. Continue home Plavix and statin. PT and OT eval evaluated and recommended SNF.  TOC on board.   History of CAD:  Serial troponin negative. EKG without acute ischemic finding. Continue home Plavix, Ranexa and statin.   Hypotension/history of essential hypertension:  Hypotension resolved.  Blood pressure elevated. Currently on metoprolol 50 Mg twice daily with good blood  pressure control but sinus tachycardia which has improved.   Sinus tachycardia/ Hyperthyroidism:  Clinically euvolemic.  Had sustained sinus tachycardia in the 120s-130s on 10/21.  Suspect related to underlying hyperthyroidism TFTs 10/21: TSH 1.306, free T32.4, free T41.48. Continue current metoprolol 50 Mg twice daily and may need to consider switching over to propranolol. Low suspicion for infection.  Increased methimazole from 5 mg daily to 10 mg twice daily.  Sinus tachycardia has resolved.   Sinus rhythm mostly in the 80s-90s.  Will reduce to 5 Mg twice daily to see if she does equally well with that. Outpatient follow-up with repeat TFTs in 3 to 4 weeks.  Also need to closely follow LFTs and CBCs as outpatient. Recommend outpatient endocrinology consultation or follow-up.  Do not think or know if she actually sees one currently.   Had seen an endocrinologist in 2019 but that was for her DM and pheochromocytoma follow-up.   Uncontrolled IDDM-2 with hyperglycemia:  HbA1c 8.5 on 10/20 Currently on Semglee 15 units daily, mealtime NovoLog 3 units 3 times daily and moderate SSI.   These are much lower dose insulins than her home dose, will uptitrate. 2 hypoglycemic episodes last evening in the mid 50s.  Suspect due to poor oral intake.  Discontinue mealtime NovoLog.   History of diastolic CHF:  Appears euvolemic overall.   Anxiety and depression:  - Stable -Continue home meds.   Lactic acidosis:  Likely due to dehydration.  Infectious workup unrevealing.  Resolved.  Antibiotics discontinued   Hypokalemia/hypomagnesemia/hypophosphatemia -Monitor replenish as appropriate   Pheochromocytoma, s/p right-sided unilateral adrenalectomy:   Chronic anticoagulation for DVT 2023 PTA was on prophylactic dose anticoagulation Xarelto 10 Mg daily, confirmed by pharmacy on home med rec, resumed.   Monitor closely for bleeding.  Duration of anticoagulation not clear,?  Lifelong.  Also on Plavix  for CVA.  Obviously increases bleeding risk.   This needs to be closely evaluated as outpatient by her PCP.   Body mass index is 35.6 kg/m./Obesity   DVT prophylaxis: Xarelto Code Status: Full code Family Communication: No family at bed side. Disposition Plan:     Status is: Inpatient Remains inpatient appropriate because:  Remains inpatient due to ongoing poor oral intake which needs to improve for DC to next level of care.  If oral intake remains poor, may have to coordinate with patient/family regarding alternate means of nutrition.  May need goals of care discussions by palliative care team.    Consultants:  PCCM  Procedures:  Antimicrobials:  Anti-infectives (From admission, onward)    Start     Dose/Rate Route Frequency Ordered Stop   11/22/22 1400  linezolid (ZYVOX) IVPB 600 mg  Status:  Discontinued        600 mg 300 mL/hr over 60 Minutes Intravenous Every 12 hours 11/22/22 1338 11/23/22 0844   11/22/22 1400  ceFEPIme (MAXIPIME) 2 g in sodium chloride 0.9 % 100 mL IVPB  Status:  Discontinued        2 g 200 mL/hr over 30 Minutes Intravenous Every 12 hours 11/22/22 1343 11/23/22 0844   11/22/22 1345  ceFEPIme (MAXIPIME) 2 g in sodium chloride 0.9 % 100 mL IVPB  Status:  Discontinued        2 g 200 mL/hr over 30  Minutes Intravenous  Once 11/22/22 1335 11/22/22 1343   11/22/22 1345  linezolid (ZYVOX) IVPB 600 mg  Status:  Discontinued        600 mg 300 mL/hr over 60 Minutes Intravenous Every 12 hours 11/22/22 1338 11/22/22 1338   11/22/22 0215  cefTRIAXone (ROCEPHIN) 1 g in sodium chloride 0.9 % 100 mL IVPB        1 g 200 mL/hr over 30 Minutes Intravenous  Once 11/22/22 0208 11/22/22 0319   11/22/22 0215  azithromycin (ZITHROMAX) 500 mg in sodium chloride 0.9 % 250 mL IVPB        500 mg 250 mL/hr over 60 Minutes Intravenous  Once 11/22/22 0208 11/22/22 0505      Subjective: Patient was seen and examined at bedside.  Overnight events noted.   She has left upper  extremity weakness but able to lift left leg. She appears deconditioned. Her oral intake is very minimal. She is asking when she could be discharged.  Objective: Vitals:   11/30/22 2253 12/01/22 0006 12/01/22 0328 12/01/22 0837  BP: 128/84 115/77 129/81 (!) 120/99  Pulse: 88 79 87 97  Resp: 18 16 18 18   Temp:  98 F (36.7 C) 98 F (36.7 C) 98 F (36.7 C)  TempSrc:  Oral Oral Oral  SpO2: 100% 100% 100% 100%  Weight:      Height:        Intake/Output Summary (Last 24 hours) at 12/01/2022 1159 Last data filed at 12/01/2022 1030 Gross per 24 hour  Intake 480 ml  Output --  Net 480 ml   Filed Weights   11/28/22 0506 11/29/22 0500 11/30/22 0508  Weight: 89.1 kg 90.7 kg 92.8 kg    Examination:  General exam: Appears comfortable, deconditioned, not in any acute distress. Respiratory system: CTA bilaterally. Respiratory effort normal.  RR 15 Cardiovascular system: S1 & S2 heard, RRR. No murmer. No pedal edema. Gastrointestinal system: Abdomen is non distended, soft and non tender. Normal bowel sounds heard. Central nervous system: Alert and oriented X 3. Left UE weakness/ Swelling Extremities: No edema, no cyanosis, no clubbing. Skin: No rashes, lesions or ulcers Psychiatry: Judgement and insight appear normal. Mood & affect appropriate.     Data Reviewed: I have personally reviewed following labs and imaging studies  CBC: Recent Labs  Lab 11/24/22 1810 11/25/22 0520 11/26/22 0508 11/27/22 0707  WBC  --  9.2 8.2 8.4  HGB 11.0* 11.0* 10.2* 9.7*  HCT 33.6* 33.5* 31.0* 29.7*  MCV  --  79.0* 81.2 80.5  PLT  --  392 367 377   Basic Metabolic Panel: Recent Labs  Lab 11/25/22 0520 11/26/22 0508 11/27/22 0707  NA 139 137 137  K 4.3 4.1 3.9  CL 106 106 105  CO2 22 22 23   GLUCOSE 185* 146* 134*  BUN 14 16 18   CREATININE 1.16* 1.03* 1.05*  CALCIUM 9.2 8.8* 8.9  MG 2.1  --   --   PHOS 3.4  --   --    GFR: Estimated Creatinine Clearance: 68 mL/min (A) (by C-G  formula based on SCr of 1.05 mg/dL (H)). Liver Function Tests: Recent Labs  Lab 11/25/22 0520  ALBUMIN 3.4*   No results for input(s): "LIPASE", "AMYLASE" in the last 168 hours.  No results for input(s): "AMMONIA" in the last 168 hours.  Coagulation Profile: No results for input(s): "INR", "PROTIME" in the last 168 hours.  Cardiac Enzymes: No results for input(s): "CKTOTAL", "CKMB", "CKMBINDEX", "TROPONINI" in the last  168 hours. BNP (last 3 results) No results for input(s): "PROBNP" in the last 8760 hours. HbA1C: No results for input(s): "HGBA1C" in the last 72 hours.  CBG: Recent Labs  Lab 11/30/22 0620 11/30/22 1216 11/30/22 1609 11/30/22 2115 12/01/22 0630  GLUCAP 196* 289* 266* 214* 211*   Lipid Profile: No results for input(s): "CHOL", "HDL", "LDLCALC", "TRIG", "CHOLHDL", "LDLDIRECT" in the last 72 hours. Thyroid Function Tests: No results for input(s): "TSH", "T4TOTAL", "FREET4", "T3FREE", "THYROIDAB" in the last 72 hours.  Anemia Panel: No results for input(s): "VITAMINB12", "FOLATE", "FERRITIN", "TIBC", "IRON", "RETICCTPCT" in the last 72 hours. Sepsis Labs: No results for input(s): "PROCALCITON", "LATICACIDVEN" in the last 168 hours.   Recent Results (from the past 240 hour(s))  Blood Culture (routine x 2)     Status: None   Collection Time: 11/22/22 12:19 AM   Specimen: BLOOD RIGHT ARM  Result Value Ref Range Status   Specimen Description BLOOD RIGHT ARM  Final   Special Requests   Final    BOTTLES DRAWN AEROBIC AND ANAEROBIC Blood Culture adequate volume   Culture   Final    NO GROWTH 5 DAYS Performed at [facility], [address], [place], [place] [address]    Report Status 11/27/2022 FINAL  Final  Resp panel by RT-PCR (RSV, Flu A&B, Covid) Anterior Nasal Swab     Status: None   Collection Time: 11/22/22 12:19 AM   Specimen: Anterior Nasal Swab  Result Value Ref Range Status   SARS Coronavirus 2 by RT PCR NEGATIVE NEGATIVE Final    Influenza A by PCR NEGATIVE NEGATIVE Final   Influenza B by PCR NEGATIVE NEGATIVE Final    Comment: (NOTE) The Xpert Xpress SARS-CoV-2/FLU/RSV plus assay is intended as an aid in the diagnosis of influenza from Nasopharyngeal swab specimens and should not be used as a sole basis for treatment. Nasal washings and aspirates are unacceptable for Xpert Xpress SARS-CoV-2/FLU/RSV testing.  Fact Sheet for Patients: BloggerCourse.com  Fact Sheet for Healthcare Providers: SeriousBroker.it  This test is not yet approved or cleared by the [place] FDA and has been authorized for detection and/or diagnosis of SARS-CoV-2 by FDA under an Emergency Use Authorization (EUA). This EUA will remain in effect (meaning this test can be used) for the duration of the COVID-19 declaration under Section 564(b)(1) of the Act, 21 U.S.C. section 360bbb-3(b)(1), unless the authorization is terminated or revoked.     Resp Syncytial Virus by PCR NEGATIVE NEGATIVE Final    Comment: (NOTE) Fact Sheet for Patients: BloggerCourse.com  Fact Sheet for Healthcare Providers: SeriousBroker.it  This test is not yet approved or cleared by the [place] FDA and has been authorized for detection and/or diagnosis of SARS-CoV-2 by FDA under an Emergency Use Authorization (EUA). This EUA will remain in effect (meaning this test can be used) for the duration of the COVID-19 declaration under Section 564(b)(1) of the Act, 21 U.S.C. section 360bbb-3(b)(1), unless the authorization is terminated or revoked.  Performed at [facility], [address] [place], [place] [address]   Blood Culture (routine x 2)     Status: None   Collection Time: 11/22/22  1:41 AM   Specimen: BLOOD RIGHT HAND  Result Value Ref Range Status   Specimen Description BLOOD RIGHT HAND  Final   Special Requests   Final    BOTTLES DRAWN  AEROBIC ONLY Blood Culture adequate volume   Culture   Final    NO GROWTH 5 DAYS  Performed at [facility], [address], [place], [place] [address]    Report Status 11/27/2022 FINAL  Final    Radiology Studies: No results found.  Scheduled Meds:  atorvastatin  80 mg Oral Daily   baclofen  10 mg Oral TID   budesonide  0.25 mg Nebulization BID   Chlorhexidine Gluconate Cloth  6 each Topical Daily   clopidogrel  75 mg Oral Daily   escitalopram  20 mg Oral q morning   ezetimibe  10 mg Oral Daily   feeding supplement  237 mL Oral BID BM   gabapentin  100 mg Oral TID   insulin aspart  0-15 Units Subcutaneous TID WC   insulin aspart  0-5 Units Subcutaneous QHS   insulin glargine-yfgn  18 Units Subcutaneous Daily   methimazole  5 mg Oral BID   metoprolol tartrate  50 mg Oral BID   pantoprazole  40 mg Oral BID AC   polyethylene glycol  17 g Oral Daily   ranolazine  500 mg Oral BID   rivaroxaban  10 mg Oral Daily   senna-docusate  2 tablet Oral QHS   sodium chloride flush  10-40 mL Intracatheter Q12H   tamsulosin  0.4 mg Oral QHS   topiramate  25 mg Oral BID   Continuous Infusions:   LOS: 9 days    Time spent: 35 Mins    Willeen Niece, MD [facility]   If 7PM-7AM, please contact night-coverage

## 2022-12-02 DIAGNOSIS — G9341 Metabolic encephalopathy: Secondary | ICD-10-CM | POA: Diagnosis not present

## 2022-12-02 LAB — GLUCOSE, CAPILLARY
Glucose-Capillary: 236 mg/dL — ABNORMAL HIGH (ref 70–99)
Glucose-Capillary: 172 mg/dL — ABNORMAL HIGH (ref 70–99)
Glucose-Capillary: 218 mg/dL — ABNORMAL HIGH (ref 70–99)
Glucose-Capillary: 308 mg/dL — ABNORMAL HIGH (ref 70–99)
Glucose-Capillary: 266 mg/dL — ABNORMAL HIGH (ref 70–99)

## 2022-12-02 LAB — COMPREHENSIVE METABOLIC PANEL
ALT: 13 U/L (ref 0–44)
AST: 11 U/L — ABNORMAL LOW (ref 15–41)
Albumin: 3 g/dL — ABNORMAL LOW (ref 3.5–5.0)
Alkaline Phosphatase: 55 U/L (ref 38–126)
Anion gap: 8 (ref 5–15)
BUN: 20 mg/dL (ref 6–20)
CO2: 23 mmol/L (ref 22–32)
Calcium: 8.9 mg/dL (ref 8.9–10.3)
Chloride: 105 mmol/L (ref 98–111)
Creatinine, Ser: 0.94 mg/dL (ref 0.44–1.00)
GFR, Estimated: >60 mL/min (ref >60)
Glucose, Bld: 196 mg/dL — ABNORMAL HIGH (ref 70–99)
Potassium: 4 mmol/L (ref 3.5–5.1)
Sodium: 136 mmol/L (ref 135–145)
Total Bilirubin: 0.3 mg/dL (ref 0.3–1.2)
Total Protein: 6.3 g/dL — ABNORMAL LOW (ref 6.5–8.1)

## 2022-12-02 LAB — CBC
HCT: 30.1 % — ABNORMAL LOW (ref 36.0–46.0)
Hemoglobin: 9.7 g/dL — ABNORMAL LOW (ref 12.0–15.0)
MCH: 26 pg (ref 26.0–34.0)
MCHC: 32.2 g/dL (ref 30.0–36.0)
MCV: 80.7 fL (ref 80.0–100.0)
Platelets: 424 10*3/uL — ABNORMAL HIGH (ref 150–400)
RBC: 3.73 MIL/uL — ABNORMAL LOW (ref 3.87–5.11)
RDW: 15.9 % — ABNORMAL HIGH (ref 11.5–15.5)
WBC: 8.5 10*3/uL (ref 4.0–10.5)
nRBC: 0 % (ref 0.0–0.2)

## 2022-12-02 LAB — MAGNESIUM: Magnesium: 1.7 mg/dL (ref 1.7–2.4)

## 2022-12-02 LAB — PHOSPHORUS: Phosphorus: 4.1 mg/dL (ref 2.5–4.6)

## 2022-12-02 MED ORDER — ORAL CARE MOUTH RINSE
15.0000 mL | OROMUCOSAL | Status: DC
  Administered 2022-12-02 – 2022-12-06 (×18): 15 mL via OROMUCOSAL

## 2022-12-02 MED ORDER — ORAL CARE MOUTH RINSE: 15.0000 mL | OROMUCOSAL | Status: DC | PRN

## 2022-12-02 NOTE — Progress Notes (Signed)
PROGRESS NOTE    Lori [name]  NWG:[id number] DOB: Nov 01, 1970 DOA: 11/21/2022 PCP: Caesar Bookman, NP   Brief Narrative: This 52 year old female, medical history significant for CVA with residual left hemiparesis/spasticity/dysphagia, DM2/IDDM, CAD, chronic diastolic CHF, HTN, anxiety/depression, pheochromocytoma s/p unilateral adrenalectomy in 2018, hospitalization 9/29 - 10/3 for acute metabolic encephalopathy, presented to [facility] ED on 10/17 with altered mental status, nausea and vomiting. She was admitted for acute metabolic encephalopathy, AKI, intractable nausea and vomiting, hematemesis and tachycardia.  Postadmission on 10/18, became hypotensive with SBP in the 70s that recovered after IV fluid boluses.  Nausea, vomiting, dehydration and AKI resolved. Mental status has improved but not clear what is her baseline.  Ongoing poor oral intake.  Sinus tachycardia suspected due to hyperthyroid.  Ongoing poor oral intake.  Also awaiting SNF.   Assessment & Plan:   Principal Problem:   Acute metabolic encephalopathy Active Problems:   Nausea & vomiting   AKI (acute kidney injury) (HCC)   History of CVA with spastic paresis (cerebrovascular accident)   Hyperthyroidism   Essential hypertension   Insulin dependent type 2 diabetes mellitus (HCC)   Left hemiplegia (HCC)   History of CAD (coronary artery disease)   Chronic heart failure with preserved ejection fraction (HFpEF) (HCC)   Chronic anticoagulation   Tachycardia   Anxiety and depression   Aphasia   Debility   Hyponatremia   Hypotension   Delirium   Dehydration  Acute metabolic encephalopathy/agitation:  Likely due to dehydration and polypharmacy.   No clear source of infection.   CT head and ABG without acute finding.   Encephalopathy seems to have resolved.  Might have some behavioral issues.   Baseline mental status is not clear and seems at baseline. Minimize opioids and sedative meds.  Delirium precautions.   Ongoing reorientation. Mental status Improving   Dehydration, nausea and vomiting:  Now resolved. CT chest, abdomen and pelvis without acute findings.  Abdominal exam benign.  Diet has been advanced to dysphagia 2 diet but per nursing, not really eating much.   This will be increased risk for recurrent dehydration. Only 10% intake at each meal has been documented.   Registered dietitian consulted for nutritional supplements.   If oral intake does not improve, then we will have to discuss with patient/spouse regarding alternate feeding options i.e. tube feeding.   Encouraged p.o. intake.   Acute Kidney Injury:  Likely due to the above.   Baseline Cr ~0.9-1.0.   AKI resolved but remains at risk for recurrent AKI due to poor oral intake. Push fluids orally.  Patient appears to be drinking well.  Nutritional shakes may help.   Positive gastric Hemoccult: Likely from Mallory-Weiss tear related to intractable nausea and vomiting, but cannot rule out gastritis or PUD.   Slight drop in Hgb likely dilutional from IV fluids.   Overall, H&H seems to be at baseline. Nausea and vomiting seems to have resolved. Changed Protonix from IV to Oral Protonix. Home Plavix and Xarelto have been resumed.  Monitor closely.   Dysphagia:  Speech therapy has advance diet to dysphagia 2 diet and thin liquids. Patient however needs to be fed and taking in very little per nursing at bedside.   Encourage p.o. intake.   History of CVA with residual left hemiplegia/spasticity and dysarthria:  CT head without acute findings. Continue home Plavix and statin. PT and OT eval evaluated and recommended SNF. TOC on board.   History of CAD:  Serial troponin negative. EKG without  acute ischemic findings. Continue home Plavix, Ranexa and statin.   Hypotension/history of essential hypertension:  Hypotension resolved.  Blood pressure elevated. Currently on metoprolol 50 Mg twice daily with good blood pressure control  but sinus tachycardia which has improved.   Sinus tachycardia/ Hyperthyroidism:  Clinically euvolemic.  Had sustained sinus tachycardia in the 120s-130s on 10/21.  Suspect related to underlying hyperthyroidism TFTs 10/21: TSH 1.306, free T32.4, free T41.48. Continue current metoprolol 50 Mg twice daily and may need to consider switching over to propranolol. Low suspicion for infection.  Increased methimazole from 5 mg daily to 10 mg twice daily.  Sinus tachycardia has resolved.   Sinus rhythm mostly in the 80s-90s.  Will reduce to 5 Mg twice daily to see if she does equally well with that. Outpatient follow-up with repeat TFTs in 3 to 4 weeks.  Also need to closely follow LFTs and CBCs as outpatient. Recommend outpatient endocrinology consultation or follow-up.  Do not think or know if she actually sees one currently.   Had seen an endocrinologist in 2019 but that was for her DM and pheochromocytoma follow-up.   Uncontrolled IDDM-2 with hyperglycemia:  HbA1c 8.5 on 10/20 Currently on Semglee 15 units daily, mealtime NovoLog 3 units 3 times daily and moderate SSI.   These are much lower dose insulins than her home dose, will uptitrate. 2 hypoglycemic episodes last evening in the mid 50s.  Suspect due to poor oral intake.  Discontinue mealtime NovoLog.   History of diastolic CHF:  Appears euvolemic overall.   Anxiety and depression:  - Stable -Continue home meds.   Lactic acidosis:  Likely due to dehydration.  Infectious workup unrevealing.  Resolved.  Antibiotics discontinued.   Hypokalemia/hypomagnesemia/hypophosphatemia -Monitor replenish as appropriate   Pheochromocytoma, s/p right-sided unilateral adrenalectomy:   Chronic anticoagulation for DVT 2023 PTA was on prophylactic dose anticoagulation Xarelto 10 Mg daily, confirmed by pharmacy on home med rec, resumed.   Monitor closely for bleeding.  Duration of anticoagulation not clear,?  Lifelong.  Also on Plavix for CVA.   Obviously increases bleeding risk.   This needs to be closely evaluated as outpatient by her PCP.   Body mass index is 35.6 kg/m./Obesity   DVT prophylaxis: Xarelto Code Status: Full code Family Communication: No family at bed side. Disposition Plan:     Status is: Inpatient Remains inpatient appropriate because:  Remains inpatient due to ongoing poor oral intake which needs to improve for DC to next level of care.  If oral intake remains poor, may have to coordinate with patient/family regarding alternate means of nutrition.  May need goals of care discussions by palliative care team.    Consultants:  PCCM  Procedures:  Antimicrobials:  Anti-infectives (From admission, onward)    Start     Dose/Rate Route Frequency Ordered Stop   11/22/22 1400  linezolid (ZYVOX) IVPB 600 mg  Status:  Discontinued        600 mg 300 mL/hr over 60 Minutes Intravenous Every 12 hours 11/22/22 1338 11/23/22 0844   11/22/22 1400  ceFEPIme (MAXIPIME) 2 g in sodium chloride 0.9 % 100 mL IVPB  Status:  Discontinued        2 g 200 mL/hr over 30 Minutes Intravenous Every 12 hours 11/22/22 1343 11/23/22 0844   11/22/22 1345  ceFEPIme (MAXIPIME) 2 g in sodium chloride 0.9 % 100 mL IVPB  Status:  Discontinued        2 g 200 mL/hr over 30 Minutes Intravenous  Once 11/22/22  1335 11/22/22 1343   11/22/22 1345  linezolid (ZYVOX) IVPB 600 mg  Status:  Discontinued        600 mg 300 mL/hr over 60 Minutes Intravenous Every 12 hours 11/22/22 1338 11/22/22 1338   11/22/22 0215  cefTRIAXone (ROCEPHIN) 1 g in sodium chloride 0.9 % 100 mL IVPB        1 g 200 mL/hr over 30 Minutes Intravenous  Once 11/22/22 0208 11/22/22 0319   11/22/22 0215  azithromycin (ZITHROMAX) 500 mg in sodium chloride 0.9 % 250 mL IVPB        500 mg 250 mL/hr over 60 Minutes Intravenous  Once 11/22/22 0208 11/22/22 0505      Subjective: Patient was seen and examined at bedside.  Overnight events noted.   She has left upper extremity  weakness and swelling. She appears deconditioned. Her oral intake is slowly improving She is asking when she could be discharged.  Objective: Vitals:   12/02/22 0344 12/02/22 0741 12/02/22 0838 12/02/22 1142  BP: 119/84 93/74  95/76  Pulse: 81 77 78 75  Resp: 16 16 16 17   Temp: 98.2 F (36.8 C) 97.9 F (36.6 C)  98.2 F (36.8 C)  TempSrc: Oral Oral  Oral  SpO2: 100% 100%  100%  Weight:      Height:        Intake/Output Summary (Last 24 hours) at 12/02/2022 1255 Last data filed at 12/02/2022 0900 Gross per 24 hour  Intake 360 ml  Output 1000 ml  Net -640 ml   Filed Weights   11/28/22 0506 11/29/22 0500 11/30/22 0508  Weight: 89.1 kg 90.7 kg 92.8 kg    Examination:  General exam: Appears comfortable, deconditioned, not in any acute distress. Respiratory system: CTA bilaterally. Respiratory effort normal.  RR 14 Cardiovascular system: S1 & S2 heard, RRR. No murmer. No pedal edema. Gastrointestinal system: Abdomen is non distended, soft and non tender. Normal bowel sounds heard. Central nervous system: Alert and oriented X 3. Left UE weakness/ Swelling Extremities: No edema, no cyanosis, no clubbing. Skin: No rashes, lesions or ulcers Psychiatry: Judgement and insight appear normal. Mood & affect appropriate.     Data Reviewed: I have personally reviewed following labs and imaging studies  CBC: Recent Labs  Lab 11/26/22 0508 11/27/22 0707 12/02/22 0530  WBC 8.2 8.4 8.5  HGB 10.2* 9.7* 9.7*  HCT 31.0* 29.7* 30.1*  MCV 81.2 80.5 80.7  PLT 367 377 424*   Basic Metabolic Panel: Recent Labs  Lab 11/26/22 0508 11/27/22 0707 12/02/22 0530  NA 137 137 136  K 4.1 3.9 4.0  CL 106 105 105  CO2 22 23 23   GLUCOSE 146* 134* 196*  BUN 16 18 20   CREATININE 1.03* 1.05* 0.94  CALCIUM 8.8* 8.9 8.9  MG  --   --  1.7  PHOS  --   --  4.1   GFR: Estimated Creatinine Clearance: 76 mL/min (by C-G formula based on SCr of 0.94 mg/dL). Liver Function Tests: Recent Labs   Lab 12/02/22 0530  AST 11*  ALT 13  ALKPHOS 55  BILITOT 0.3  PROT 6.3*  ALBUMIN 3.0*   No results for input(s): "LIPASE", "AMYLASE" in the last 168 hours.  No results for input(s): "AMMONIA" in the last 168 hours.  Coagulation Profile: No results for input(s): "INR", "PROTIME" in the last 168 hours.  Cardiac Enzymes: No results for input(s): "CKTOTAL", "CKMB", "CKMBINDEX", "TROPONINI" in the last 168 hours. BNP (last 3 results) No results for  input(s): "PROBNP" in the last 8760 hours. HbA1C: No results for input(s): "HGBA1C" in the last 72 hours.  CBG: Recent Labs  Lab 12/01/22 1203 12/01/22 1612 12/01/22 2129 12/02/22 0620 12/02/22 1144  GLUCAP 304* 207* 307* 172* 218*   Lipid Profile: No results for input(s): "CHOL", "HDL", "LDLCALC", "TRIG", "CHOLHDL", "LDLDIRECT" in the last 72 hours. Thyroid Function Tests: No results for input(s): "TSH", "T4TOTAL", "FREET4", "T3FREE", "THYROIDAB" in the last 72 hours.  Anemia Panel: No results for input(s): "VITAMINB12", "FOLATE", "FERRITIN", "TIBC", "IRON", "RETICCTPCT" in the last 72 hours. Sepsis Labs: No results for input(s): "PROCALCITON", "LATICACIDVEN" in the last 168 hours.   No results found for this or any previous visit (from the past 240 hour(s)).   Radiology Studies: No results found.  Scheduled Meds:  atorvastatin  80 mg Oral Daily   baclofen  10 mg Oral TID   budesonide  0.25 mg Nebulization BID   Chlorhexidine Gluconate Cloth  6 each Topical Daily   clopidogrel  75 mg Oral Daily   escitalopram  20 mg Oral q morning   ezetimibe  10 mg Oral Daily   feeding supplement  237 mL Oral BID BM   gabapentin  100 mg Oral TID   insulin aspart  0-15 Units Subcutaneous TID WC   insulin aspart  0-5 Units Subcutaneous QHS   insulin glargine-yfgn  22 Units Subcutaneous Daily   methimazole  5 mg Oral BID   metoprolol tartrate  50 mg Oral BID   mouth rinse  15 mL Mouth Rinse 4 times per day   pantoprazole  40 mg  Oral BID AC   polyethylene glycol  17 g Oral Daily   ranolazine  500 mg Oral BID   rivaroxaban  10 mg Oral Daily   senna-docusate  2 tablet Oral QHS   sodium chloride flush  10-40 mL Intracatheter Q12H   tamsulosin  0.4 mg Oral QHS   topiramate  25 mg Oral BID   Continuous Infusions:   LOS: 10 days    Time spent: 35 Mins    Willeen Niece, MD [facility]   If 7PM-7AM, please contact night-coverage

## 2022-12-02 NOTE — Progress Notes (Signed)
Physical Therapy Treatment Patient Details Name: Lori [name] MRN: [id number] DOB: 1970/08/25 Today's Date: 12/02/2022   History of Present Illness 52 y.o. female presented to [facility] ED 10/17 with behavioral change, increased somnolence and nausea and vomiting. Dx Dehydration with AKI. PMH of CVA with left hemiparesis/spasticity/aphasia, IDDM-2, CAD, diastolic CHF, HTN, hyperthyroidism, anxiety and depression presenting with behavioral change, increased somnolence and nausea and vomiting.    PT Comments  Pt mentioned last visit using a hemi-walker at home. This was utilized today but with significant difficulty requiring mod assist +2 for transfer out of bed. After discussing with pt, she felt that she may actually use a "Platform walker" for LUE support. We can try this at next visit and I believe she will feel more stable and capable of ambulating short distances with this device. Reviewed LE exercises and bed mobility training. Patient will continue to benefit from skilled physical therapy services to further improve independence with functional mobility.     If plan is discharge home, recommend the following: Assist for transportation;Assistance with feeding;Assistance with cooking/housework;Help with stairs or ramp for entrance;A lot of help with bathing/dressing/bathroom;Two people to help with walking and/or transfers;Supervision due to cognitive status   Can travel by private vehicle     No (Likely soon)  Equipment Recommendations  None recommended by PT    Recommendations for Other Services       Precautions / Restrictions Precautions Precautions: Fall Precaution Comments: chronic L side weakness 2* CVA Restrictions Weight Bearing Restrictions: No Other Position/Activity Restrictions: L sided weakness     Mobility  Bed Mobility Overal bed mobility: Needs Assistance Bed Mobility: Rolling, Sidelying to Sit Rolling: Used rails, Min assist Sidelying to sit: Min assist, HOB  elevated, Used rails       General bed mobility comments: Rolls towards Rt side, reaching for rail and pulling, assisted with LEs at min A level. Min assist to rise from elevated HOB position, cues to push through RUE. Effortful for pt.    Transfers Overall transfer level: Needs assistance Equipment used: Hemi-walker Transfers: Sit to/from Stand, Bed to chair/wheelchair/BSC Sit to Stand: Min assist, +2 safety/equipment Stand pivot transfers: +2 physical assistance, Mod assist, +2 safety/equipment         General transfer comment: Min assist +2 for boost and safety to rise from bed x2 with hemi-walker on Rt. Progressed with small step pivot transfer towards Rt side. Pt attempts to sit prematurely, requires assist and max VC to remain standing until aligned with recliner. Did not demonstrate adequate strength with LLE to safely use hemi-walker without significant help.    Ambulation/Gait               General Gait Details: Deferred with hemi-walker - will try Lt platform RW next visit.   Stairs             Wheelchair Mobility     Tilt Bed    Modified Rankin (Stroke Patients Only)       Balance Overall balance assessment: Needs assistance Sitting-balance support: Single extremity supported, Feet supported Sitting balance-Leahy Scale: Poor     Standing balance support: During functional activity, Single extremity supported Standing balance-Leahy Scale: Poor                              Cognition Arousal: Alert Behavior During Therapy: Flat affect Overall Cognitive Status: No family/caregiver present to determine baseline cognitive functioning  Exercises General Exercises - Lower Extremity Ankle Circles/Pumps: AROM, Both, 10 reps, Supine Quad Sets: Strengthening, Both, 10 reps, Seated Gluteal Sets: Strengthening, Both, 10 reps, Seated Long Arc Quad: AROM, Right, Left, 10 reps,  Seated Hip Flexion/Marching: AROM, Seated, Standing, Both, 10 reps    General Comments        Pertinent Vitals/Pain Pain Assessment Pain Intervention(s): Monitored during session    Home Living                          Prior Function            PT Goals (current goals can now be found in the care plan section) Acute Rehab PT Goals Patient Stated Goal: Go to rehab PT Goal Formulation: With patient Time For Goal Achievement: 12/06/22 Potential to Achieve Goals: Good Progress towards PT goals: Progressing toward goals    Frequency    Min 1X/week      PT Plan      Co-evaluation              AM-PAC PT "6 Clicks" Mobility   Outcome Measure  Help needed turning from your back to your side while in a flat bed without using bedrails?: A Little Help needed moving from lying on your back to sitting on the side of a flat bed without using bedrails?: A Lot Help needed moving to and from a bed to a chair (including a wheelchair)?: A Lot Help needed standing up from a chair using your arms (e.g., wheelchair or bedside chair)?: A Lot Help needed to walk in hospital room?: A Lot Help needed climbing 3-5 steps with a railing? : Total 6 Click Score: 12    End of Session Equipment Utilized During Treatment: Gait belt Activity Tolerance: Patient tolerated treatment well Patient left: in chair;with call bell/phone within reach;with chair alarm set;with SCD's reapplied;with family/visitor present Nurse Communication: Mobility status PT Visit Diagnosis: Hemiplegia and hemiparesis;Muscle weakness (generalized) (M62.81);History of falling (Z91.81);Difficulty in walking, not elsewhere classified (R26.2);Other symptoms and signs involving the nervous system (R29.898) Hemiplegia - Right/Left: Left Hemiplegia - dominant/non-dominant: Non-dominant Hemiplegia - caused by: Cerebral infarction     Time: 1212-1229 PT Time Calculation (min) (ACUTE ONLY): 17 min  Charges:     $Therapeutic Activity: 8-22 mins PT General Charges $$ ACUTE PT VISIT: 1 Visit                     Kathlyn Sacramento, PT, DPT [facility]  Rehabilitation Services Physical Therapist Office: [phone] Website: [email or web address]    Berton Mount 12/02/2022, 1:23 PM

## 2022-12-02 NOTE — TOC Transition Note (Signed)
Transition of Care [facility]) - CM/SW Discharge Note   Patient Details  Name: Lori [name] MRN: [id number] Date of Birth: [birth year]  [facility] CM/SW Contact:  Erin Sons, LCSW Phone Number: 12/02/2022, 3:47 PM   Clinical Narrative:     Bedside RN reported improvements in pt's agitation and mental status. CSW left message on confidential voicemail with Hillery Aldo explaining current improvements. CSW requested return call; awaiting response.     Barriers to Discharge: Continued Medical Work up, English as a second language teacher   Patient Goals and CMS Choice CMS Medicare.gov Compare Post Acute Care list provided to:: Patient Represenative (must comment) Choice offered to / list presented to : Spouse  Discharge Placement                         Discharge Plan and Services Additional resources added to the After Visit Summary for       Post Acute Care Choice: Skilled Nursing Facility                               Social Determinants of Health (SDOH) Interventions SDOH Screenings   Food Insecurity: No Food Insecurity (11/27/2022)  Housing: Patient Declined (11/27/2022)  Transportation Needs: No Transportation Needs (11/27/2022)  Utilities: Not At Risk (11/27/2022)  Depression (PHQ2-9): Low Risk  (11/01/2022)  Recent Concern: Depression (PHQ2-9) - Medium Risk (10/28/2022)  Tobacco Use: Low Risk  (11/27/2022)     Readmission Risk Interventions    11/05/2022    1:38 PM 10/15/2022   10:25 AM 08/29/2021    2:32 PM  Readmission Risk Prevention Plan  Transportation Screening Complete Complete Complete  Medication Review Oceanographer) Complete  Complete  PCP or Specialist appointment within 3-5 days of discharge Complete Complete Complete  HRI or Home Care Consult Complete Complete Complete  SW Recovery Care/Counseling Consult Complete Complete Complete  Palliative Care Screening Not Applicable Not Applicable Not Applicable  Skilled Nursing  Facility Not Applicable Not Applicable Complete

## 2022-12-03 DIAGNOSIS — G9341 Metabolic encephalopathy: Secondary | ICD-10-CM | POA: Diagnosis not present

## 2022-12-03 LAB — GLUCOSE, CAPILLARY
Glucose-Capillary: 116 mg/dL — ABNORMAL HIGH (ref 70–99)
Glucose-Capillary: 263 mg/dL — ABNORMAL HIGH (ref 70–99)
Glucose-Capillary: 272 mg/dL — ABNORMAL HIGH (ref 70–99)
Glucose-Capillary: 292 mg/dL — ABNORMAL HIGH (ref 70–99)

## 2022-12-03 NOTE — Inpatient Diabetes Management (Signed)
Inpatient Diabetes Program Recommendations  AACE/ADA: New Consensus Statement on Inpatient Glycemic Control (2015)  Target Ranges:  Prepandial:   less than 140 mg/dL      Peak postprandial:   less than 180 mg/dL (1-2 hours)      Critically ill patients:  140 - 180 mg/dL   Lab Results  Component Value Date   GLUCAP 116 (H) 12/03/2022   HGBA1C 8.5 (H) 11/24/2022    Review of Glycemic Control  Latest Reference Range & Units 12/02/22 06:20 12/02/22 11:44 12/02/22 16:30 12/02/22 21:14 12/03/22 06:09  Glucose-Capillary 70 - 99 mg/dL 756 (H) 433 (H) 295 (H) 266 (H) 116 (H)  (H): Data is abnormally high  Diabetes history: DM Outpatient Diabetes medications: Novolog 15 units TID, Lantus 40 units every day, Ozempic 0.5 weeKly Current orders for Inpatient glycemic control: Semglee 22 units every day, Novolog 0-15 units TID and 0-5 units QHS  Inpatient Diabetes Program Recommendations:    Please consider:  Novolog 4 units TID with meals if she considers at least 50%  Will continue to follow while inpatient.  Thank you, Dulce Sellar, MSN, CDCES Diabetes Coordinator Inpatient Diabetes Program [phone] (team pager from 8a-5p)

## 2022-12-03 NOTE — Progress Notes (Signed)
PROGRESS NOTE    Lori [name]  VHQ:[id number] DOB: July 05, 1970 DOA: 11/21/2022 PCP: Caesar Bookman, NP   Brief Narrative: This 52 year old female, with medical history significant for CVA with residual left hemiparesis/spasticity/dysphagia, DM2/IDDM, CAD, chronic diastolic CHF, HTN, anxiety/depression, pheochromocytoma s/p unilateral adrenalectomy in 2018, hospitalization from 9/29 - 10/3 for acute metabolic encephalopathy, presented to [facility] ED on 10/17 with altered mental status, nausea and vomiting. She was admitted for acute metabolic encephalopathy, AKI, intractable nausea and vomiting, hematemesis and tachycardia.  Postadmission on 10/18, became hypotensive with SBP in the 70s that recovered after IV fluid boluses.  Nausea, vomiting, dehydration and AKI resolved. Mental status has improved but not clear what is her baseline.Ongoing poor oral intake.  Sinus tachycardia suspected due to hyperthyroidism.  Ongoing poor oral intake.  Also awaiting SNF.   Assessment & Plan:   Principal Problem:   Acute metabolic encephalopathy Active Problems:   Nausea & vomiting   AKI (acute kidney injury) (HCC)   History of CVA with spastic paresis (cerebrovascular accident)   Hyperthyroidism   Essential hypertension   Insulin dependent type 2 diabetes mellitus (HCC)   Left hemiplegia (HCC)   History of CAD (coronary artery disease)   Chronic heart failure with preserved ejection fraction (HFpEF) (HCC)   Chronic anticoagulation   Tachycardia   Anxiety and depression   Aphasia   Debility   Hyponatremia   Hypotension   Delirium   Dehydration  Acute metabolic encephalopathy/agitation:  Likely due to dehydration and polypharmacy.   No clear source of infection.   CT head and ABG without acute finding.   Encephalopathy seems to have resolved. Might have some behavioral issues.   Baseline mental status is not clear. Minimize opioids and sedative meds.  Delirium precautions.  Ongoing  reorientation. Mental status Improving   Dehydration, nausea and vomiting:  Now resolved. CT chest, abdomen and pelvis without acute findings.  Abdominal exam benign.  Diet has been advanced to dysphagia 2 diet but per nursing, not really eating much.   This will be increased risk for recurrent dehydration. Only 10% intake at each meal has been documented.   Registered dietitian consulted for nutritional supplements.   If oral intake does not improve, then we will have to discuss with patient/spouse regarding alternate feeding options i.e. tube feeding.   Encouraged p.o. intake.   Acute Kidney Injury:  Likely due to the above.   Baseline Cr ~0.9-1.0.   AKI resolved but remains at risk for recurrent AKI due to poor oral intake. Push fluids orally.  Patient appears to be drinking well.  Nutritional shakes may help.   Positive gastric Hemoccult: Likely from Mallory-Weiss tear related to intractable nausea and vomiting, but cannot rule out gastritis or PUD.   Slight drop in Hgb likely dilutional from IV fluids.   Overall, H&H seems to be at baseline. Nausea and vomiting seems to have resolved. Changed Protonix from IV to Oral Protonix. Home Plavix and Xarelto have been resumed.  Monitor closely.   Dysphagia:  Speech therapy has advanced diet to dysphagia 2 diet and thin liquids. Patient however needs to be fed and taking in very little per nursing at bedside.   Encourage p.o. intake.   History of CVA with residual left hemiplegia/spasticity and dysarthria:  CT head without acute findings. Continue home Plavix and statin. PT and OT eval evaluated and recommended SNF. TOC on board.   History of CAD:  Serial troponin negative. EKG without acute ischemic findings. Continue home  Plavix, Ranexa and statin.   Hypotension/history of essential hypertension:  Hypotension resolved.  Blood pressure elevated. Currently on metoprolol 50 Mg twice daily.   Sinus tachycardia/ Hyperthyroidism:   Clinically euvolemic.  Had sustained sinus tachycardia in the 120s-130s on 10/21.   Suspect related to underlying hyperthyroidism TFTs 10/21: TSH 1.306, free T32.4, free T41.48. Continue current metoprolol 50 Mg twice daily and may need to consider switching over to propranolol. Low suspicion for infection.  Increased methimazole from 5 mg daily to 10 mg twice daily.  Sinus tachycardia has resolved.   Outpatient follow-up with repeat TFTs in 3 to 4 weeks.  Also need to closely follow LFTs and CBCs as outpatient. Recommend outpatient endocrinology consultation or follow-up.  Do not think or know if she actually sees one currently.   Had seen an endocrinologist in 2019 but that was for her DM and pheochromocytoma follow-up.   Uncontrolled IDDM-2 with hyperglycemia:  HbA1c 8.5 on 10/20 Currently on Semglee 22 units daily, mealtime NovoLog 0-14 units 3 times daily and moderate SSI.   2 hypoglycemic episodes in the mid 50s.  Suspect due to poor oral intake.   History of diastolic CHF:  Appears euvolemic overall.   Anxiety and depression:  - Stable -Continue home meds.   Lactic acidosis:  Likely due to dehydration.  Infectious workup unrevealing.   Resolved.  Antibiotics discontinued.   Hypokalemia/hypomagnesemia/hypophosphatemia -Monitor replenish as appropriate   Pheochromocytoma, s/p right-sided unilateral adrenalectomy:   Chronic anticoagulation for DVT 2023 PTA was on prophylactic dose anticoagulation Xarelto 10 Mg daily, confirmed by pharmacy on home med rec, resumed.   Monitor closely for bleeding.  Duration of anticoagulation not clear,?  Lifelong.   Also on Plavix for CVA.  Obviously increases bleeding risk.   This needs to be closely evaluated as outpatient by her PCP.   Body mass index is 35.6 kg/m./Obesity   DVT prophylaxis: Xarelto Code Status: Full code Family Communication: No family at bed side. Disposition Plan:     Status is: Inpatient Remains inpatient  appropriate because:  Remains inpatient due to ongoing poor oral intake which needs to improve for DC to next level of care.  If oral intake remains poor, may have to coordinate with patient/family regarding alternate means of nutrition.  May need goals of care discussions by palliative care team.   Consultants:  PCCM  Procedures:  Antimicrobials:  Anti-infectives (From admission, onward)    Start     Dose/Rate Route Frequency Ordered Stop   11/22/22 1400  linezolid (ZYVOX) IVPB 600 mg  Status:  Discontinued        600 mg 300 mL/hr over 60 Minutes Intravenous Every 12 hours 11/22/22 1338 11/23/22 0844   11/22/22 1400  ceFEPIme (MAXIPIME) 2 g in sodium chloride 0.9 % 100 mL IVPB  Status:  Discontinued        2 g 200 mL/hr over 30 Minutes Intravenous Every 12 hours 11/22/22 1343 11/23/22 0844   11/22/22 1345  ceFEPIme (MAXIPIME) 2 g in sodium chloride 0.9 % 100 mL IVPB  Status:  Discontinued        2 g 200 mL/hr over 30 Minutes Intravenous  Once 11/22/22 1335 11/22/22 1343   11/22/22 1345  linezolid (ZYVOX) IVPB 600 mg  Status:  Discontinued        600 mg 300 mL/hr over 60 Minutes Intravenous Every 12 hours 11/22/22 1338 11/22/22 1338   11/22/22 0215  cefTRIAXone (ROCEPHIN) 1 g in sodium chloride 0.9 % 100 mL  IVPB        1 g 200 mL/hr over 30 Minutes Intravenous  Once 11/22/22 0208 11/22/22 0319   11/22/22 0215  azithromycin (ZITHROMAX) 500 mg in sodium chloride 0.9 % 250 mL IVPB        500 mg 250 mL/hr over 60 Minutes Intravenous  Once 11/22/22 4401 11/22/22 0505      Subjective: Patient was seen and examined at bedside.  Overnight events noted.   Patient has left upper extremity weakness and swelling She appears deconditioned. Her oral intake is slowly improving She is asking when she could be discharged.  Objective: Vitals:   12/03/22 0500 12/03/22 0720 12/03/22 0824 12/03/22 1211  BP:  (!) 110/90  137/82  Pulse:  92 92 86  Resp:  17 18 18   Temp:  98.2 F (36.8 C)  98 F  (36.7 C)  TempSrc:  Oral  Oral  SpO2:  99%  98%  Weight: 91.5 kg     Height:        Intake/Output Summary (Last 24 hours) at 12/03/2022 1214 Last data filed at 12/03/2022 0900 Gross per 24 hour  Intake 615 ml  Output 200 ml  Net 415 ml   Filed Weights   11/29/22 0500 11/30/22 0508 12/03/22 0500  Weight: 90.7 kg 92.8 kg 91.5 kg    Examination:  General exam: Appears deconditioned, but comfortable, not in any acute distress. Respiratory system: CTA bilaterally. Respiratory effort normal.  RR 15 Cardiovascular system: S1 & S2 heard, RRR. No murmer. No pedal edema. Gastrointestinal system: Abdomen is non distended, soft and non tender. Normal bowel sounds heard. Central nervous system: Alert and oriented X 3. Left UE weakness/ Swelling Extremities: No edema, no cyanosis, no clubbing. Skin: No rashes, lesions or ulcers Psychiatry: Judgement and insight appear normal. Mood & affect appropriate.     Data Reviewed: I have personally reviewed following labs and imaging studies  CBC: Recent Labs  Lab 11/27/22 0707 12/02/22 0530  WBC 8.4 8.5  HGB 9.7* 9.7*  HCT 29.7* 30.1*  MCV 80.5 80.7  PLT 377 424*   Basic Metabolic Panel: Recent Labs  Lab 11/27/22 0707 12/02/22 0530  NA 137 136  K 3.9 4.0  CL 105 105  CO2 23 23  GLUCOSE 134* 196*  BUN 18 20  CREATININE 1.05* 0.94  CALCIUM 8.9 8.9  MG  --  1.7  PHOS  --  4.1   GFR: Estimated Creatinine Clearance: 75.4 mL/min (by C-G formula based on SCr of 0.94 mg/dL). Liver Function Tests: Recent Labs  Lab 12/02/22 0530  AST 11*  ALT 13  ALKPHOS 55  BILITOT 0.3  PROT 6.3*  ALBUMIN 3.0*   No results for input(s): "LIPASE", "AMYLASE" in the last 168 hours.  No results for input(s): "AMMONIA" in the last 168 hours.  Coagulation Profile: No results for input(s): "INR", "PROTIME" in the last 168 hours.  Cardiac Enzymes: No results for input(s): "CKTOTAL", "CKMB", "CKMBINDEX", "TROPONINI" in the last 168  hours. BNP (last 3 results) No results for input(s): "PROBNP" in the last 8760 hours. HbA1C: No results for input(s): "HGBA1C" in the last 72 hours.  CBG: Recent Labs  Lab 12/02/22 1144 12/02/22 1630 12/02/22 2114 12/03/22 0609 12/03/22 1155  GLUCAP 218* 308* 266* 116* 263*   Lipid Profile: No results for input(s): "CHOL", "HDL", "LDLCALC", "TRIG", "CHOLHDL", "LDLDIRECT" in the last 72 hours. Thyroid Function Tests: No results for input(s): "TSH", "T4TOTAL", "FREET4", "T3FREE", "THYROIDAB" in the last 72 hours.  Anemia Panel: No results for input(s): "VITAMINB12", "FOLATE", "FERRITIN", "TIBC", "IRON", "RETICCTPCT" in the last 72 hours. Sepsis Labs: No results for input(s): "PROCALCITON", "LATICACIDVEN" in the last 168 hours.   No results found for this or any previous visit (from the past 240 hour(s)).   Radiology Studies: No results found.  Scheduled Meds:  atorvastatin  80 mg Oral Daily   baclofen  10 mg Oral TID   budesonide  0.25 mg Nebulization BID   Chlorhexidine Gluconate Cloth  6 each Topical Daily   clopidogrel  75 mg Oral Daily   escitalopram  20 mg Oral q morning   ezetimibe  10 mg Oral Daily   feeding supplement  237 mL Oral BID BM   gabapentin  100 mg Oral TID   insulin aspart  0-15 Units Subcutaneous TID WC   insulin aspart  0-5 Units Subcutaneous QHS   insulin glargine-yfgn  22 Units Subcutaneous Daily   methimazole  5 mg Oral BID   metoprolol tartrate  50 mg Oral BID   mouth rinse  15 mL Mouth Rinse 4 times per day   pantoprazole  40 mg Oral BID AC   polyethylene glycol  17 g Oral Daily   ranolazine  500 mg Oral BID   rivaroxaban  10 mg Oral Daily   senna-docusate  2 tablet Oral QHS   sodium chloride flush  10-40 mL Intracatheter Q12H   tamsulosin  0.4 mg Oral QHS   topiramate  25 mg Oral BID   Continuous Infusions:   LOS: 11 days    Time spent: 35 Mins    Willeen Niece, MD [facility]   If 7PM-7AM, please contact  night-coverage

## 2022-12-03 NOTE — Plan of Care (Signed)
  Problem: Safety: Goal: Ability to remain free from injury will improve Outcome: Progressing   

## 2022-12-03 NOTE — TOC Progression Note (Signed)
Transition of Care [facility]) - Progression Note    Patient Details  Name: Joyann Tivis MRN: [id number] Date of Birth: 1970/08/30  Transition of [facility]) CM/SW Contact  Alexys Gassett [place], [place] Phone Number: 12/03/2022, 1:19 PM  Clinical Narrative:     Mickel Crow twice today and left message with secretary; still no response. CSW met with pt and explained need for another SNF choice. Provided list with their medicare star ratings. CSW explained he would update pt when a response Is received from [place].   Expected Discharge Plan: Skilled Nursing Facility Barriers to Discharge: Continued Medical Work up, English as a second language teacher  Expected Discharge Plan and Services     Post Acute Care Choice: Skilled Nursing Facility Living arrangements for the past 2 months: Single Family Home                                       Social Determinants of Health (SDOH) Interventions SDOH Screenings   Food Insecurity: No Food Insecurity (11/27/2022)  Housing: Patient Declined (11/27/2022)  Transportation Needs: No Transportation Needs (11/27/2022)  Utilities: Not At Risk (11/27/2022)  Depression (PHQ2-9): Low Risk  (11/01/2022)  Recent Concern: Depression (PHQ2-9) - Medium Risk (10/28/2022)  Tobacco Use: Low Risk  (11/27/2022)    Readmission Risk Interventions    11/05/2022    1:38 PM 10/15/2022   10:25 AM 08/29/2021    2:32 PM  Readmission Risk Prevention Plan  Transportation Screening Complete Complete Complete  Medication Review Oceanographer) Complete  Complete  PCP or Specialist appointment within 3-5 days of discharge Complete Complete Complete  HRI or Home Care Consult Complete Complete Complete  SW Recovery Care/Counseling Consult Complete Complete Complete  Palliative Care Screening Not Applicable Not Applicable Not Applicable  Skilled Nursing Facility Not Applicable Not Applicable Complete

## 2022-12-03 NOTE — Evaluation (Signed)
Clinical/Bedside Swallow Evaluation Patient Details  Name: Lori [name] MRN: [id number] Date of Birth: 1970-10-25  Today's Date: 12/03/2022 Time: SLP Start Time (ACUTE ONLY): 1549 SLP Stop Time (ACUTE ONLY): 1612 SLP Time Calculation (min) (ACUTE ONLY): 23 min  Past Medical History:  Past Medical History:  Diagnosis Date   CAD (coronary artery disease)    Depression    Diabetes mellitus without complication (HCC)    History of CT scan    History of mammogram    History of MRI    Hypertension    Pheochromocytoma    Stroke (HCC)    Thyroid disease    Past Surgical History:  Past Surgical History:  Procedure Laterality Date   ABDOMINAL HYSTERECTOMY     CESAREAN SECTION     3   CORONARY ARTERY BYPASS GRAFT     Right adrenal gland removal for pheochromocytoma Right    HPI:  Lori [name] is a 52 yo female presenting to ED 10/18 with increased somnolence and n/v. MBS 01/23/21 with moderate oropharyngeal dysphagia and silent aspiration. She was recommended a diet of Dys 1 textures with nectar thick liquids. Seen extensively throughout 2023 with significant oral dysphagia, although was ultimately recommended a Dys 3 diet with thin liquids. PMH includes prior CVA with residual L hemiparesis/spasticity and aphasia, IDDM2, CAD, diastolic CHF, HTN, hyperthyroidism, anxiety, depression    Assessment / Plan / Recommendation  Clinical Impression  Pt reports general distaste for the food to be the most significant inhibitor of adequate oral intake. Oral motor exam WFL. Trials of thin liquids and small, bite-sized solids overall without s/s of dysphagia or aspiration. Pt is edentulous and does not wear dentures while eating which impacts the textures the she is able to effectively masticate. Discussed options being limited by Dys 3 diet, although pt is hesistant to upgrade to regular solids. Circled choices on the menu that pt prefers since she states she cannot chew the chicken. SLP  placed pt's dinner order and suspect pt's intake will improve if given increased assistance with ordering preferred meals rather than receiving house trays. Recommend continuing current diet of Dys 3 texture solids with thin liquids, with which pt verbalizes agreement. No further f/u is necessary. SLP will s/o at this time. SLP Visit Diagnosis: Dysphagia, unspecified (R13.10)    Aspiration Risk  Mild aspiration risk    Diet Recommendation Dysphagia 3 (Mech soft);Thin liquid    Liquid Administration via: Cup;Straw Medication Administration: Whole meds with puree Supervision: Patient able to self feed;Full supervision/cueing for compensatory strategies Compensations: Minimize environmental distractions;Slow rate;Small sips/bites Postural Changes: Seated upright at 90 degrees    Other  Recommendations Oral Care Recommendations: Oral care BID    Recommendations for follow up therapy are one component of a multi-disciplinary discharge planning process, led by the attending physician.  Recommendations may be updated based on patient status, additional functional criteria and insurance authorization.  Follow up Recommendations Skilled nursing-short term rehab (<3 hours/day)      Assistance Recommended at Discharge    Functional Status Assessment Patient has had a recent decline in their functional status and demonstrates the ability to make significant improvements in function in a reasonable and predictable amount of time.  Frequency and Duration            Prognosis Prognosis for improved oropharyngeal function: Good      Swallow Study   General HPI: Lori [name] is a 52 yo female presenting to ED 10/18 with increased somnolence  and n/v. MBS 01/23/21 with moderate oropharyngeal dysphagia and silent aspiration. She was recommended a diet of Dys 1 textures with nectar thick liquids. Seen extensively throughout 2023 with significant oral dysphagia, although was ultimately recommended  a Dys 3 diet with thin liquids. PMH includes prior CVA with residual L hemiparesis/spasticity and aphasia, IDDM2, CAD, diastolic CHF, HTN, hyperthyroidism, anxiety, depression Type of Study: Bedside Swallow Evaluation Previous Swallow Assessment: see HPI Diet Prior to this Study: Dysphagia 3 (mechanical soft);Thin liquids (Level 0) Temperature Spikes Noted: No Respiratory Status: Room air History of Recent Intubation: No Behavior/Cognition: Alert;Cooperative Oral Cavity Assessment: Within Functional Limits Oral Care Completed by SLP: No Oral Cavity - Dentition: Edentulous Vision: Functional for self-feeding Self-Feeding Abilities: Able to feed self Patient Positioning: Upright in bed Baseline Vocal Quality: Normal Volitional Cough: Strong Volitional Swallow: Able to elicit    Oral/Motor/Sensory Function Overall Oral Motor/Sensory Function: Within functional limits   Ice Chips Ice chips: Not tested   Thin Liquid Thin Liquid: Within functional limits Presentation: Straw;Self Fed    Nectar Thick Nectar Thick Liquid: Not tested   Honey Thick Honey Thick Liquid: Not tested   Puree Puree: Not tested   Solid     Solid: Within functional limits Presentation: Self Fed      Lori [name], M.A., CF-SLP Speech Language Pathology, Acute Rehabilitation Services  Secure Chat preferred [phone]  12/03/2022,4:23 PM

## 2022-12-03 NOTE — Plan of Care (Signed)
°  Problem: Fluid Volume: °Goal: Hemodynamic stability will improve °Outcome: Progressing °  °Problem: Clinical Measurements: °Goal: Diagnostic test results will improve °Outcome: Progressing °Goal: Signs and symptoms of infection will decrease °Outcome: Progressing °  °

## 2022-12-04 ENCOUNTER — Telehealth: Payer: Self-pay

## 2022-12-04 DIAGNOSIS — N179 Acute kidney failure, unspecified: Secondary | ICD-10-CM | POA: Diagnosis not present

## 2022-12-04 DIAGNOSIS — G9341 Metabolic encephalopathy: Secondary | ICD-10-CM | POA: Diagnosis not present

## 2022-12-04 DIAGNOSIS — R4182 Altered mental status, unspecified: Secondary | ICD-10-CM

## 2022-12-04 LAB — GLUCOSE, CAPILLARY
Glucose-Capillary: 240 mg/dL — ABNORMAL HIGH (ref 70–99)
Glucose-Capillary: 229 mg/dL — ABNORMAL HIGH (ref 70–99)
Glucose-Capillary: 318 mg/dL — ABNORMAL HIGH (ref 70–99)
Glucose-Capillary: 277 mg/dL — ABNORMAL HIGH (ref 70–99)

## 2022-12-04 MED ORDER — ADULT MULTIVITAMIN W/MINERALS CH
1.0000 | ORAL_TABLET | Freq: Every day | ORAL | Status: DC
  Administered 2022-12-04 – 2022-12-06 (×3): 1
  Filled 2022-12-04 (×3): qty 1

## 2022-12-04 MED ORDER — ENSURE ENLIVE PO LIQD
237.0000 mL | Freq: Two times a day (BID) | ORAL | Status: DC
  Administered 2022-12-04 – 2022-12-06 (×5): 237 mL via ORAL

## 2022-12-04 NOTE — Progress Notes (Addendum)
Nutrition Follow-up  DOCUMENTATION CODES:  Obesity unspecified  INTERVENTION:  Continue current diet as ordered Encourage good PO intake Ensure Enlive po BID, each supplement provides 350 kcal and 20 grams of protein. - will change timing to be with breakfast and dinner to aid in glucose management MVI with minerals daily  NUTRITION DIAGNOSIS:  Inadequate oral intake related to dysphagia, other (see comment) (dyslike of food texture and possibly due to constipation) as evidenced by meal completion < 50%. - remains applicable  GOAL:  Patient will meet greater than or equal to 90% of their needs - progressing  MONITOR:  PO intake, Labs, Supplement acceptance, Weight trends, Skin  REASON FOR ASSESSMENT:  Consult Assessment of nutrition requirement/status  ASSESSMENT:  52 y/o female presented with behavior change, increased somnolence, nausea and vomiting.  PMH: CVA with left hemiparesis/spasticity/aphasia, IDDM-2, CAD, diastolic CHF, HTN, hyperthyroidism, anxiety, depression, pheochromocytoma s/p unilateral adrenalectomy 2018.  Pt working with PT at the time of assessment. Reviewed intake and pt still appears to be having minimal PO intake (10-50% of meals over the last week). Pt is drinking ensure consistently though.     Admit weight: 88 kg Current weight: 91.5 kg   Average Meal Intake: 10/25-10/29: 26% intake x 8 recorded meals  Nutritionally Relevant Medications: Scheduled Meds:  atorvastatin  80 mg Oral Daily   baclofen  10 mg Oral TID   ezetimibe  10 mg Oral Daily   feeding supplement  237 mL Oral BID BM   insulin aspart  0-15 Units Subcutaneous TID WC   insulin aspart  0-5 Units Subcutaneous QHS   insulin glargine-yfgn  22 Units Subcutaneous Daily   pantoprazole  40 mg Oral BID AC   polyethylene glycol  17 g Oral Daily   senna-docusate  2 tablet Oral QHS   PRN Meds: bisacodyl, diphenhydrAMINE, ondansetron  Labs Reviewed: CBG ranges from 116-292 mg/dL over the  last 24 hours HgbA1c 8.5% 10/20  NUTRITION - FOCUSED PHYSICAL EXAM: Flowsheet Row Most Recent Value  Orbital Region No depletion  Upper Arm Region No depletion  Thoracic and Lumbar Region No depletion  Buccal Region No depletion  Temple Region No depletion  Clavicle Bone Region No depletion  Clavicle and Acromion Bone Region No depletion  Scapular Bone Region No depletion  Dorsal Hand No depletion  Patellar Region No depletion  Anterior Thigh Region No depletion  Posterior Calf Region No depletion  Edema (RD Assessment) Mild  Hair Reviewed  Eyes Reviewed  Mouth Reviewed  Skin Reviewed  Nails Reviewed   Diet Order:   Diet Order             DIET DYS 3 Room service appropriate? Yes with Assist; Fluid consistency: Thin  Diet effective now                   EDUCATION NEEDS:  No education needs have been identified at this time  Skin:  Skin Integrity Issues: MASD: - groin (right and left)  Last BM:  10/28 - type 3  Height:  Ht Readings from Last 1 Encounters:  11/22/22 5\' 2"  (1.575 m)   Weight:  Wt Readings from Last 1 Encounters:  12/03/22 91.5 kg   Ideal Body Weight:  50 kg  BMI:  Body mass index is 36.9 kg/m.  Estimated Nutritional Needs:  Kcal:  1500-1700 kcal/d Protein:  70-90g/d Fluid:  >/=1.5L/d    Greig Castilla, RD, LDN Clinical Dietitian RD pager # available in [facility]  After hours/weekend pager #  available in [facility]

## 2022-12-04 NOTE — Plan of Care (Signed)
  Problem: Fluid Volume: Goal: Hemodynamic stability will improve Outcome: Progressing   Problem: Fluid Volume: Goal: Ability to maintain a balanced intake and output will improve Outcome: Progressing   Problem: Nutritional: Goal: Maintenance of adequate nutrition will improve Outcome: Progressing   Problem: Skin Integrity: Goal: Risk for impaired skin integrity will decrease Outcome: Progressing

## 2022-12-04 NOTE — Progress Notes (Signed)
Physical Therapy Treatment Patient Details Name: Lori [name] MRN: [id number] DOB: 1970-09-11 Today's Date: 12/04/2022   History of Present Illness 52 y.o. female presented to [facility] ED 10/17 with behavioral change, increased somnolence and nausea and vomiting. Dx Dehydration with AKI. PMH of CVA with left hemiparesis/spasticity/aphasia, IDDM-2, CAD, diastolic CHF, HTN, hyperthyroidism, anxiety and depression presenting with behavioral change, increased somnolence and nausea and vomiting.    PT Comments  Eager to get out of bed and sit up to eat. Assisted at min A level for bed mobility to roll and rise to EOB, showing better initiation, needs some assist with sequencing and technique with cues throughout. Min assist for boost to stand with use of Lt platform RW today from bed and recliner. Mod assist for step pivot, initially with good Lt quad activation however with fatigue required Lt knee block and assist to maneuver RW. Attempts to sit prematurely, reviewed safety and techniques, awareness of alignment prior to sitting with reaching back with RUE for arm rest. Pre-gait activity much better with platform RW, demonstrating ability to clear feet and march in place with cues for safe techniques (Push through RUE and extend/lock Lt knee when lifting RLE.) Stepping forward and backwards with platform RW pt still has Lt knee buckling, suggest +2 for chair follow next visit to progress gait. Patient will continue to benefit from skilled physical therapy services to further improve independence with functional mobility.    If plan is discharge home, recommend the following: Assist for transportation;Assistance with feeding;Assistance with cooking/housework;Help with stairs or ramp for entrance;A lot of help with bathing/dressing/bathroom;Two people to help with walking and/or transfers;Supervision due to cognitive status   Can travel by private vehicle     No (Likely soon)  Equipment Recommendations   None recommended by PT    Recommendations for Other Services       Precautions / Restrictions Precautions Precautions: Fall Precaution Comments: chronic L side weakness 2* CVA Restrictions Weight Bearing Restrictions: No Other Position/Activity Restrictions: L sided weakness     Mobility  Bed Mobility Overal bed mobility: Needs Assistance Bed Mobility: Rolling, Sidelying to Sit Rolling: Used rails, Min assist Sidelying to sit: Min assist, HOB elevated, Used rails       General bed mobility comments: Rolls towards Rt side, reaching for rail and pulling through RUE, assisted with final few inches of LEs over EOB at min A level. Min assist to rise from elevated HOB position, cues to push through RUE. Better push today.    Transfers Overall transfer level: Needs assistance Equipment used: Left platform walker Transfers: Sit to/from Stand, Bed to chair/wheelchair/BSC Sit to Stand: Min assist, From elevated surface   Step pivot transfers: Mod assist, From elevated surface       General transfer comment: Min assist for boost to stand from elevated bed, and from recliner today, using platform RW to support LUE. VC for Rt hand placement to boost from surface and to reach back with RUE prior to sitting, assisted with LUE placement onto platform. Mod for step pivot transfer towards right side. Good effort with Lt knee extension but fatigues rapidly. Platform does help initially with steps towards recliner but Lt knee buckles as she fatigues. Max cues for alignment and awareness prior to sitting as she attempts to sit prematurely each time.    Ambulation/Gait Ambulation/Gait assistance: Mod assist Gait Distance (Feet): 2 Feet Assistive device: Left platform walker Gait Pattern/deviations: Step-through pattern, Decreased stride length, Decreased stance time - left, Knees  buckling Gait velocity: dec   Pre-gait activities: Static march with Lt platform RW. Pt requires cues to stand  upright, push through RUE for support and is able to clear each foot from ground to step in place today. General Gait Details: Better support noted with Lt platform RW today. Suggest +2 for chair follow as she attempts to sit prematurely and Lt knee buckles with fatigue. Steping forward and backwards close to chair today with platform RW for support, mod assist for balance and Lt knee block however shows ability to set Lt quad when cued.   Stairs             Wheelchair Mobility     Tilt Bed    Modified Rankin (Stroke Patients Only)       Balance Overall balance assessment: Needs assistance Sitting-balance support: Feet supported, No upper extremity supported Sitting balance-Leahy Scale: Fair Sitting balance - Comments: static sitting EOB CGA->sup level Postural control: Right lateral lean Standing balance support: Single extremity supported, Reliant on assistive device for balance Standing balance-Leahy Scale: Poor Standing balance comment: Requires UE support for static standing                            Cognition Arousal: Alert Behavior During Therapy: Flat affect Overall Cognitive Status: No family/caregiver present to determine baseline cognitive functioning Area of Impairment: Following commands, Safety/judgement, Problem solving                       Following Commands: Follows multi-step commands inconsistently Safety/Judgement: Decreased awareness of deficits, Decreased awareness of safety   Problem Solving: Slow processing, Decreased initiation, Difficulty sequencing, Requires verbal cues, Requires tactile cues          Exercises General Exercises - Lower Extremity Ankle Circles/Pumps: AROM, Both, 10 reps, Supine Quad Sets: Strengthening, Both, 10 reps, Seated Gluteal Sets: Strengthening, Both, 10 reps, Seated Hip Flexion/Marching: AROM, Seated, Standing, Both, 10 reps Other Exercises Other Exercises: Seated balance, progressed to  no UE support EOB, supervision level    General Comments        Pertinent Vitals/Pain Pain Assessment Pain Assessment: No/denies pain Pain Intervention(s): Monitored during session    Home Living                          Prior Function            PT Goals (current goals can now be found in the care plan section) Acute Rehab PT Goals Patient Stated Goal: Go to rehab PT Goal Formulation: With patient Time For Goal Achievement: 12/06/22 Potential to Achieve Goals: Good Progress towards PT goals: Progressing toward goals    Frequency    Min 1X/week      PT Plan      Co-evaluation              AM-PAC PT "6 Clicks" Mobility   Outcome Measure  Help needed turning from your back to your side while in a flat bed without using bedrails?: A Little Help needed moving from lying on your back to sitting on the side of a flat bed without using bedrails?: A Lot Help needed moving to and from a bed to a chair (including a wheelchair)?: A Lot Help needed standing up from a chair using your arms (e.g., wheelchair or bedside chair)?: A Lot Help needed to walk in hospital room?: A Lot Help  needed climbing 3-5 steps with a railing? : Total 6 Click Score: 12    End of Session Equipment Utilized During Treatment: Gait belt Activity Tolerance: Patient tolerated treatment well Patient left: in chair;with call bell/phone within reach;with chair alarm set;with SCD's reapplied;with nursing/sitter in room Nurse Communication: Mobility status PT Visit Diagnosis: Hemiplegia and hemiparesis;Muscle weakness (generalized) (M62.81);History of falling (Z91.81);Difficulty in walking, not elsewhere classified (R26.2);Other symptoms and signs involving the nervous system (R29.898) Hemiplegia - Right/Left: Left Hemiplegia - dominant/non-dominant: Non-dominant     Time: 6578-4696 PT Time Calculation (min) (ACUTE ONLY): 21 min  Charges:    $Therapeutic Activity: 8-22 mins PT  General Charges $$ ACUTE PT VISIT: 1 Visit                     Lori [name], PT, DPT [facility]  Rehabilitation Services Physical Therapist Office: [phone] Website: [email or web address]    Berton Mount 12/04/2022, 11:12 AM

## 2022-12-04 NOTE — Progress Notes (Signed)
Progress Note   Patient: Lori [name] ZOX:[id number] DOB: 03/03/1970 DOA: 11/21/2022     12 DOS: the patient was seen and examined on 12/04/2022   Brief hospital course: 52 year old female, with medical history significant for CVA with residual left hemiparesis/spasticity/dysphagia, DM2/IDDM, CAD, chronic diastolic CHF, HTN, anxiety/depression, pheochromocytoma s/p unilateral adrenalectomy in 2018, hospitalization from 9/29 - 10/3 for acute metabolic encephalopathy, presented to [facility] ED on 10/17 with altered mental status, nausea and vomiting. She was admitted for acute metabolic encephalopathy, AKI, intractable nausea and vomiting, hematemesis and tachycardia.  Postadmission on 10/18, became hypotensive with SBP in the 70s that recovered after IV fluid boluses.  Nausea, vomiting, dehydration and AKI resolved. Mental status has improved but not clear what is her baseline.Ongoing poor oral intake.  Sinus tachycardia suspected due to hyperthyroidism.  Ongoing poor oral intake.  Also awaiting SNF.   Assessment and Plan: Acute metabolic encephalopathy/agitation:  Likely due to dehydration and polypharmacy.   No clear source of infection.   CT head and ABG without acute finding.   Encephalopathy seems to have resolved.  Minimize opioids and sedative meds.  Delirium precautions.  Ongoing reorientation. Mental status had improved   Dehydration, nausea and vomiting:  Now resolved. CT chest, abdomen and pelvis without acute findings.  Abdominal exam benign.  Diet has been advanced to dysphagia 2 diet but per nursing, not really eating much.   This will be increased risk for recurrent dehydration. Only 10% intake at each meal has been documented.   Registered dietitian consulted for nutritional supplements.   If oral intake does not improve, then we will have to discuss with patient/spouse regarding alternate feeding options i.e. tube feeding.   Encouraged p.o. intake.   Acute Kidney Injury:   Likely due to the above.   Baseline Cr ~0.9-1.0.   AKI resolved but remains at risk for recurrent AKI due to poor oral intake. Push fluids orally.  Continue to encourage PO.   Positive gastric Hemoccult: Likely from Mallory-Weiss tear related to intractable nausea and vomiting, but cannot rule out gastritis or PUD.   Slight drop in Hgb likely dilutional from IV fluids.   Overall, H&H seems to be at baseline. Nausea and vomiting seems to have resolved. Changed Protonix from IV to Oral Protonix. Home Plavix and Xarelto have been resumed.  Monitor closely.   Dysphagia:  Speech therapy has advanced diet to dysphagia 2 diet and thin liquids. Patient however needs to be fed and taking in very little per nursing at bedside.   Encourage p.o. intake.   History of CVA with residual left hemiplegia/spasticity and dysarthria:  CT head without acute findings. Continue home Plavix and statin. PT and OT eval evaluated and recommended SNF. TOC on board.   History of CAD:  Serial troponin negative. EKG without acute ischemic findings. Continue home Plavix, Ranexa and statin.   Hypotension/history of essential hypertension:  Hypotension resolved.  Blood pressure elevated. Currently on metoprolol 50 Mg twice daily.   Sinus tachycardia/ Hyperthyroidism:  Clinically euvolemic.  Had sustained sinus tachycardia in the 120s-130s on 10/21.   Suspect related to underlying hyperthyroidism TFTs 10/21: TSH 1.306, free T32.4, free T41.48. Continue current metoprolol 50 Mg twice daily and may need to consider switching over to propranolol. Low suspicion for infection.  Increased methimazole from 5 mg daily to 10 mg twice daily.  Sinus tachycardia has resolved.   Outpatient follow-up with repeat TFTs in 3 to 4 weeks.  Also need to closely follow LFTs and  CBCs as outpatient. Recommend outpatient endocrinology consultation or follow-up.  Do not think or know if she actually sees one currently.   Had seen an  endocrinologist in 2019 but that was for her DM and pheochromocytoma follow-up.   Uncontrolled IDDM-2 with hyperglycemia:  HbA1c 8.5 on 10/20 Currently on Semglee 22 units daily, mealtime NovoLog 0-14 units 3 times daily and moderate SSI.   2 hypoglycemic episodes in the mid 50s.  Suspect due to poor oral intake.   History of diastolic CHF:  Appears euvolemic overall.   Anxiety and depression:  - Stable -Continue home meds.   Lactic acidosis:  Likely due to dehydration.  Infectious workup unrevealing.   Resolved.  Antibiotics discontinued.   Hypokalemia/hypomagnesemia/hypophosphatemia -Monitor replenish as appropriate   Pheochromocytoma, s/p right-sided unilateral adrenalectomy:   Chronic anticoagulation for DVT 2023 PTA was on prophylactic dose anticoagulation Xarelto 10 Mg daily, confirmed by pharmacy on home med rec, resumed.   Monitor closely for bleeding.  Duration of anticoagulation not clear,?  Lifelong.   Also on Plavix for CVA.  Obviously increases bleeding risk.   This needs to be closely evaluated as outpatient by her PCP.       Subjective: Without complaints this AM  Physical Exam: Vitals:   12/03/22 2001 12/04/22 0600 12/04/22 0816 12/04/22 1221  BP: 103/78 (!) 118/46  133/84  Pulse: 86 79 80 80  Resp: 18 18 18 18   Temp: 98.7 F (37.1 C) 98 F (36.7 C)  98.4 F (36.9 C)  TempSrc: Oral Oral  Oral  SpO2: 96% 100% 100% 100%  Weight:      Height:       General exam: Awake, sitting in chair, in nad Respiratory system: Normal respiratory effort, no wheezing Cardiovascular system: regular rate, s1, s2 Gastrointestinal system: Soft, nondistended, positive BS Central nervous system: CN2-12 grossly intact, no tremors Extremities: Perfused, no clubbing Skin: Normal skin turgor, no notable skin lesions seen Psychiatry: Mood normal // no visual hallucinations   Data Reviewed:  There are no new results to review at this time.  Family Communication: Pt in  room, family not at bedside  Disposition: Status is: Inpatient Remains inpatient appropriate because: severity of illness  Planned Discharge Destination: Skilled nursing facility    Author: Rickey Barbara, MD 12/04/2022 3:12 PM  For on call review [email or web address].

## 2022-12-04 NOTE — Hospital Course (Addendum)
52 year old female, with medical history significant for CVA with residual left hemiparesis/spasticity/dysphagia, DM2/IDDM, CAD, chronic diastolic CHF, HTN, anxiety/depression, pheochromocytoma s/p unilateral adrenalectomy in 2018, hospitalization from 9/29 - 10/3 for acute metabolic encephalopathy, presented to [facility] ED on 10/17 with altered mental status, nausea and vomiting. She was admitted for acute metabolic encephalopathy, AKI, intractable nausea and vomiting, hematemesis and tachycardia.  Postadmission on 10/18, became hypotensive with SBP in the 70s that recovered after IV fluid boluses.  Nausea, vomiting, dehydration and AKI resolved. Mental status has improved but not clear what is her baseline.Ongoing poor oral intake.  Sinus tachycardia suspected due to hyperthyroidism.  Ongoing poor oral intake. Marland Kitchen

## 2022-12-04 NOTE — Telephone Encounter (Signed)
Incoming form received from [facility] Department of [facility] and [employer]. Form filled in and placed in Ngetich, Dinah C, NP review and sign folder along with problem list and medication list

## 2022-12-04 NOTE — Inpatient Diabetes Management (Signed)
Inpatient Diabetes Program Recommendations  AACE/ADA: New Consensus Statement on Inpatient Glycemic Control (2015)  Target Ranges:  Prepandial:   less than 140 mg/dL      Peak postprandial:   less than 180 mg/dL (1-2 hours)      Critically ill patients:  140 - 180 mg/dL   Lab Results  Component Value Date   GLUCAP 240 (H) 12/04/2022   HGBA1C 8.5 (H) 11/24/2022    Review of Glycemic Control  Latest Reference Range & Units 12/03/22 11:55 12/03/22 16:43 12/03/22 20:54 12/04/22 06:47  Glucose-Capillary 70 - 99 mg/dL 956 (H) 213 (H) 086 (H) 240 (H)  (H): Data is abnormally high Diabetes history: DM Outpatient Diabetes medications: Novolog 15 units TID, Lantus 40 units every day, Ozempic 0.5 weeKly Current orders for Inpatient glycemic control: Semglee 22 units every day, Novolog 0-15 units TID and 0-5 units QHS   Inpatient Diabetes Program Recommendations:     Consider:  Novolog 4 units BID with nutritional supplement as patient not consuming meals at >50%.   Will continue to follow while inpatient.   Thanks, Lujean Rave, MSN, RNC-OB Diabetes Coordinator [phone] (8a-5p)

## 2022-12-04 NOTE — TOC Progression Note (Signed)
Transition of Care [facility]) - Progression Note    Patient Details  Name: Lori [name] MRN: [id number] Date of Birth: [birth year]  Transition of [facility]) CM/SW Contact  Baldemar Lenis, [place] Phone Number: 12/04/2022, 11:22 AM  Clinical Narrative:   CSW coordinated with PT on patient's progress, sent updated notes to [facility] and left a voicemail to ask them to review on a bed offer. Awaiting response. CSW to follow.    Expected Discharge Plan: Skilled Nursing Facility Barriers to Discharge: Continued Medical Work up, English as a second language teacher  Expected Discharge Plan and Services     Post Acute Care Choice: Skilled Nursing Facility Living arrangements for the past 2 months: Single Family Home                                       Social Determinants of Health (SDOH) Interventions SDOH Screenings   Food Insecurity: No Food Insecurity (11/27/2022)  Housing: Patient Declined (11/27/2022)  Transportation Needs: No Transportation Needs (11/27/2022)  Utilities: Not At Risk (11/27/2022)  Depression (PHQ2-9): Low Risk  (11/01/2022)  Recent Concern: Depression (PHQ2-9) - Medium Risk (10/28/2022)  Tobacco Use: Low Risk  (11/27/2022)    Readmission Risk Interventions    11/05/2022    1:38 PM 10/15/2022   10:25 AM 08/29/2021    2:32 PM  Readmission Risk Prevention Plan  Transportation Screening Complete Complete Complete  Medication Review Oceanographer) Complete  Complete  PCP or Specialist appointment within 3-5 days of discharge Complete Complete Complete  HRI or Home Care Consult Complete Complete Complete  SW Recovery Care/Counseling Consult Complete Complete Complete  Palliative Care Screening Not Applicable Not Applicable Not Applicable  Skilled Nursing Facility Not Applicable Not Applicable Complete

## 2022-12-05 DIAGNOSIS — G9341 Metabolic encephalopathy: Secondary | ICD-10-CM | POA: Diagnosis not present

## 2022-12-05 DIAGNOSIS — N179 Acute kidney failure, unspecified: Secondary | ICD-10-CM | POA: Diagnosis not present

## 2022-12-05 DIAGNOSIS — R4182 Altered mental status, unspecified: Secondary | ICD-10-CM | POA: Diagnosis not present

## 2022-12-05 LAB — CBC
HCT: 29.4 % — ABNORMAL LOW (ref 36.0–46.0)
Hemoglobin: 9.5 g/dL — ABNORMAL LOW (ref 12.0–15.0)
MCH: 25.9 pg — ABNORMAL LOW (ref 26.0–34.0)
MCHC: 32.3 g/dL (ref 30.0–36.0)
MCV: 80.1 fL (ref 80.0–100.0)
Platelets: 426 10*3/uL — ABNORMAL HIGH (ref 150–400)
RBC: 3.67 MIL/uL — ABNORMAL LOW (ref 3.87–5.11)
RDW: 15.4 % (ref 11.5–15.5)
WBC: 8.6 10*3/uL (ref 4.0–10.5)
nRBC: 0 % (ref 0.0–0.2)

## 2022-12-05 LAB — COMPREHENSIVE METABOLIC PANEL
ALT: 18 U/L (ref 0–44)
AST: 14 U/L — ABNORMAL LOW (ref 15–41)
Albumin: 2.8 g/dL — ABNORMAL LOW (ref 3.5–5.0)
Alkaline Phosphatase: 55 U/L (ref 38–126)
Anion gap: 9 (ref 5–15)
BUN: 18 mg/dL (ref 6–20)
CO2: 21 mmol/L — ABNORMAL LOW (ref 22–32)
Calcium: 8.7 mg/dL — ABNORMAL LOW (ref 8.9–10.3)
Chloride: 105 mmol/L (ref 98–111)
Creatinine, Ser: 0.93 mg/dL (ref 0.44–1.00)
GFR, Estimated: >60 mL/min (ref >60)
Glucose, Bld: 189 mg/dL — ABNORMAL HIGH (ref 70–99)
Potassium: 3.8 mmol/L (ref 3.5–5.1)
Sodium: 135 mmol/L (ref 135–145)
Total Bilirubin: 0.3 mg/dL (ref 0.3–1.2)
Total Protein: 6 g/dL — ABNORMAL LOW (ref 6.5–8.1)

## 2022-12-05 LAB — GLUCOSE, CAPILLARY
Glucose-Capillary: 177 mg/dL — ABNORMAL HIGH (ref 70–99)
Glucose-Capillary: 237 mg/dL — ABNORMAL HIGH (ref 70–99)
Glucose-Capillary: 293 mg/dL — ABNORMAL HIGH (ref 70–99)
Glucose-Capillary: 321 mg/dL — ABNORMAL HIGH (ref 70–99)

## 2022-12-05 NOTE — Progress Notes (Signed)
Progress Note   Patient: Lori [name] FAO:[id number] DOB: 11/25/1970 DOA: 11/21/2022     13 DOS: the patient was seen and examined on 12/05/2022   Brief hospital course: 52 year old female, with medical history significant for CVA with residual left hemiparesis/spasticity/dysphagia, DM2/IDDM, CAD, chronic diastolic CHF, HTN, anxiety/depression, pheochromocytoma s/p unilateral adrenalectomy in 2018, hospitalization from 9/29 - 10/3 for acute metabolic encephalopathy, presented to [facility] ED on 10/17 with altered mental status, nausea and vomiting. She was admitted for acute metabolic encephalopathy, AKI, intractable nausea and vomiting, hematemesis and tachycardia.  Postadmission on 10/18, became hypotensive with SBP in the 70s that recovered after IV fluid boluses.  Nausea, vomiting, dehydration and AKI resolved. Mental status has improved but not clear what is her baseline.Ongoing poor oral intake.  Sinus tachycardia suspected due to hyperthyroidism.  Ongoing poor oral intake.  Also awaiting SNF.   Assessment and Plan: Acute metabolic encephalopathy/agitation:  Likely due to dehydration and polypharmacy.   No clear source of infection.   CT head and ABG without acute finding.   Encephalopathy seems to have resolved.  Minimize opioids and sedative meds.  Delirium precautions.  Ongoing reorientation. Mental status had improved   Dehydration, nausea and vomiting:  Now resolved. CT chest, abdomen and pelvis without acute findings.  Abdominal exam benign.  Diet has been advanced to dysphagia 2 diet but per nursing, not really eating much.   This will be increased risk for recurrent dehydration. Only 10% intake at each meal has been documented.   Registered dietitian consulted for nutritional supplements.   If oral intake does not improve, then we will have to discuss with patient/spouse regarding alternate feeding options i.e. tube feeding.  Continue to encourage p.o. intake.   Acute Kidney  Injury:  Likely due to the above.   Baseline Cr ~0.9-1.0.   AKI resolved but remains at risk for recurrent AKI due to poor oral intake. Push fluids orally.  Continue to encourage PO.   Positive gastric Hemoccult: Likely from Mallory-Weiss tear related to intractable nausea and vomiting, but cannot rule out gastritis or PUD.   Slight drop in Hgb likely dilutional from IV fluids.   Overall, H&H seems to be at baseline. Nausea and vomiting seems to have resolved. Changed Protonix from IV to Oral Protonix. Home Plavix and Xarelto have been resumed.  Monitor closely.   Dysphagia:  Speech therapy has advanced diet to dysphagia 2 diet and thin liquids. Patient however needs to be fed and taking in very little per nursing at bedside.   Encourage p.o. intake.   History of CVA with residual left hemiplegia/spasticity and dysarthria:  CT head without acute findings. Continue home Plavix and statin. PT and OT eval evaluated and recommended SNF. TOC on board.   History of CAD:  Serial troponin negative. EKG without acute ischemic findings. Continue home Plavix, Ranexa and statin.   Hypotension/history of essential hypertension:  Hypotension resolved.  Blood pressure elevated. Currently on metoprolol 50 Mg twice daily.   Sinus tachycardia/ Hyperthyroidism:  Clinically euvolemic.  Had sustained sinus tachycardia in the 120s-130s on 10/21.   Suspect related to underlying hyperthyroidism TFTs 10/21: TSH 1.306, free T32.4, free T41.48. Continue current metoprolol 50 Mg twice daily and may need to consider switching over to propranolol. Low suspicion for infection.  Increased methimazole from 5 mg daily to 10 mg twice daily.  Sinus tachycardia has resolved.   Outpatient follow-up with repeat TFTs in 3 to 4 weeks.  Also need to closely follow LFTs  and CBCs as outpatient. Recommend outpatient endocrinology consultation or follow-up.  Do not think or know if she actually sees one currently.   Had  seen an endocrinologist in 2019 but that was for her DM and pheochromocytoma follow-up.   Uncontrolled IDDM-2 with hyperglycemia:  HbA1c 8.5 on 10/20 Currently on Semglee 22 units daily, mealtime NovoLog 0-14 units 3 times daily and moderate SSI.   2 hypoglycemic episodes in the mid 50s.  Suspect due to poor oral intake.   History of diastolic CHF:  Appears euvolemic overall.   Anxiety and depression:  - Stable -Continue home meds.   Lactic acidosis:  Likely due to dehydration.  Infectious workup unrevealing.   Resolved.  Antibiotics discontinued.   Hypokalemia/hypomagnesemia/hypophosphatemia -Monitor replenish as appropriate   Pheochromocytoma, s/p right-sided unilateral adrenalectomy:   Chronic anticoagulation for DVT 2023 PTA was on prophylactic dose anticoagulation Xarelto 10 Mg daily, confirmed by pharmacy on home med rec, resumed.   Monitor closely for bleeding.  Duration of anticoagulation not clear,?  Lifelong.   Also on Plavix for CVA.  Obviously increases bleeding risk.   This needs to be closely evaluated as outpatient by her PCP.       Subjective: No complaints today. Eager to be discharged soon  Physical Exam: Vitals:   12/05/22 0454 12/05/22 0806 12/05/22 1233 12/05/22 1536  BP: 105/70 (!) 112/90 127/88 111/77  Pulse: 84 93 90 90  Resp: 18 18 16 17   Temp: 97.8 F (36.6 C) 98.3 F (36.8 C) 97.8 F (36.6 C) 98.5 F (36.9 C)  TempSrc: Oral Oral Oral Oral  SpO2: 100% 100% 99% 98%  Weight:      Height:       General exam: Conversant, in no acute distress Respiratory system: normal chest rise, clear, no audible wheezing Cardiovascular system: regular rhythm, s1-s2 Gastrointestinal system: Nondistended, nontender, pos BS Central nervous system: No seizures, no tremors Extremities: No cyanosis, no joint deformities Skin: No rashes, no pallor Psychiatry: Affect normal // no auditory hallucinations   Data Reviewed:  Labs reviewed: Na 135, K 3.8, Cr  0.93, WBC 8.6, Hgb 9.5, 426  Family Communication: Pt in room, family not at bedside  Disposition: Status is: Inpatient Remains inpatient appropriate because: severity of illness  Planned Discharge Destination: Skilled nursing facility    Author: Rickey Barbara, MD 12/05/2022 4:30 PM  For on call review [email or web address].

## 2022-12-05 NOTE — Plan of Care (Signed)
  Problem: Fluid Volume: Goal: Ability to maintain a balanced intake and output will improve Outcome: Progressing   Problem: Health Behavior/Discharge Planning: Goal: Ability to identify and utilize available resources and services will improve Outcome: Progressing   Problem: Skin Integrity: Goal: Risk for impaired skin integrity will decrease Outcome: Progressing   Problem: Nutrition: Goal: Adequate nutrition will be maintained Outcome: Progressing   Problem: Coping: Goal: Level of anxiety will decrease Outcome: Progressing   Problem: Pain Managment: Goal: General experience of comfort will improve Outcome: Progressing   Problem: Skin Integrity: Goal: Risk for impaired skin integrity will decrease Outcome: Progressing

## 2022-12-05 NOTE — Progress Notes (Signed)
Occupational Therapy Treatment Patient Details Name: Lori [name] MRN: [id number] DOB: 1970/10/05 Today's Date: 12/05/2022   History of present illness 52 y.o. female presented to [facility] ED 10/17 with behavioral change, increased somnolence and nausea and vomiting. Dx Dehydration with AKI. PMH of CVA with left hemiparesis/spasticity/aphasia, IDDM-2, CAD, diastolic CHF, HTN, hyperthyroidism, anxiety and depression presenting with behavioral change, increased somnolence and nausea and vomiting.   OT comments  Pt is making fair progress towards their acute OT goals. Pt declined a transfer away from the bed this session but agreeable to standing practice as a precursor to [facility] transfers. Overall she needed min A to get the EOB with cues for sequencing. Once sitting, pt reported "room spinning" dizziness that did not resolve with rest, BP stable. She was able to stand with L platform walker and min A 4x, and tolerated standing ~30 seconds each time. Pt needed more assist to get back into bed at the end of the session. OT to continue to follow acutely to facilitate progress towards established goals. Pt will continue to benefit from skilled inpatient follow up therapy, <3 hours/day.        If plan is discharge home, recommend the following:  Two people to help with walking and/or transfers;A lot of help with bathing/dressing/bathroom;Assist for transportation;Help with stairs or ramp for entrance;Supervision due to cognitive status   Equipment Recommendations  Other (comment)       Precautions / Restrictions Precautions Precautions: Fall Precaution Comments: chronic L side weakness 2* CVA Required Braces or Orthoses: Splint/Cast Splint/Cast: L resting hand splint Restrictions Weight Bearing Restrictions: No Other Position/Activity Restrictions: L sided weakness       Mobility Bed Mobility Overal bed mobility: Needs Assistance Bed Mobility: Rolling, Sidelying to Sit, Sit to  Supine Rolling: Min assist Sidelying to sit: Min assist     Sit to sidelying: Max assist General bed mobility comments: news cues for step by step sequencing and initiation    Transfers Overall transfer level: Needs assistance Equipment used: Left platform walker Transfers: Sit to/from Stand Sit to Stand: Min assist           General transfer comment: attempted [facility] transfer practice, pt declined. Completed 4x STS with L patform walker, min A each trial and tolerated standing ~30 seconds each time     Balance Overall balance assessment: Needs assistance Sitting-balance support: Feet supported, No upper extremity supported Sitting balance-Leahy Scale: Fair     Standing balance support: Single extremity supported, Reliant on assistive device for balance Standing balance-Leahy Scale: Poor                             ADL either performed or assessed with clinical judgement   ADL Overall ADL's : Needs assistance/impaired                         Toilet Transfer: Moderate assistance (L platform) Toilet Transfer Details (indicate cue type and reason): simulated, pt declined actual attempt.         Functional mobility during ADLs: Moderate assistance General ADL Comments: 4x sit<>stands with L patform walker completed    Extremity/Trunk Assessment Upper Extremity Assessment Upper Extremity Assessment: LUE deficits/detail LUE Deficits / Details: MPs limited to ~20* from full extension, hard end feel. Pt reports significant pain in this position. Wrist  rests in full extension, able to move into flexion with PROM/stretching. IPs PROM are [facility] -  pt seen for resting hand splint, splint donned with fair positioning. Pt reports mild "stretching pain" at her pinky LUE Sensation: decreased light touch;decreased proprioception LUE Coordination: decreased fine motor;decreased gross motor   Lower Extremity Assessment Lower Extremity Assessment: Defer to PT  evaluation        Vision   Additional Comments: seemingly [facility]   Perception Perception Perception: Not tested   Praxis Praxis Praxis: Not tested    Cognition Arousal: Alert Behavior During Therapy: Flat affect Overall Cognitive Status: No family/caregiver present to determine baseline cognitive functioning                                 General Comments: likely baseline but conitnues to have a hard time recalling purpose/schedule of splint. follows 1 step commands with increased time. difficulty fully expressing wants/needs              General Comments VSS.    Pertinent Vitals/ Pain       Pain Assessment Pain Assessment: Faces Faces Pain Scale: No hurt Pain Intervention(s): Limited activity within patient's tolerance   Frequency  Min 1X/week        Progress Toward Goals  OT Goals(current goals can now be found in the care plan section)  Progress towards OT goals: Progressing toward goals  Acute Rehab OT Goals Patient Stated Goal: to go home OT Goal Formulation: With patient Time For Goal Achievement: 12/08/22 Potential to Achieve Goals: Fair ADL Goals Pt Will Perform Grooming: with set-up;sitting Pt Will Transfer to Toilet: with min assist;ambulating Pt Will Perform Toileting - Clothing Manipulation and hygiene: with min assist;sit to/from stand Pt/caregiver will Perform Home Exercise Program: Left upper extremity;Independently Additional ADL Goal #1: pt will indep recall splint wear schedule to promote joint integrity   AM-PAC OT "6 Clicks" Daily Activity     Outcome Measure   Help from another person eating meals?: A Lot Help from another person taking care of personal grooming?: A Lot Help from another person toileting, which includes using toliet, bedpan, or urinal?: Total Help from another person bathing (including washing, rinsing, drying)?: A Lot Help from another person to put on and taking off regular upper body clothing?: A  Lot Help from another person to put on and taking off regular lower body clothing?: A Lot 6 Click Score: 11    End of Session Equipment Utilized During Treatment: Gait belt;Rolling walker (2 wheels)  OT Visit Diagnosis: Other abnormalities of gait and mobility (R26.89);Muscle weakness (generalized) (M62.81);Other symptoms and signs involving the nervous system (R29.898)   Activity Tolerance Patient tolerated treatment well   Patient Left in bed;with bed alarm set;with call bell/phone within reach   Nurse Communication Other (comment)        Time: 4034-7425 OT Time Calculation (min): 20 min  Charges: OT General Charges $OT Visit: 1 Visit OT Treatments $Self Care/Home Management : 8-22 mins  Derenda Mis, OTR/L Acute Rehabilitation Services Office [phone] Secure Chat Communication Preferred   Lori [name] 12/05/2022, 3:10 PM

## 2022-12-05 NOTE — Telephone Encounter (Signed)
Form completed and placed on outgoing fax box

## 2022-12-06 DIAGNOSIS — Z7401 Bed confinement status: Secondary | ICD-10-CM | POA: Diagnosis not present

## 2022-12-06 DIAGNOSIS — J449 Chronic obstructive pulmonary disease, unspecified: Secondary | ICD-10-CM | POA: Diagnosis not present

## 2022-12-06 DIAGNOSIS — N179 Acute kidney failure, unspecified: Secondary | ICD-10-CM | POA: Diagnosis not present

## 2022-12-06 DIAGNOSIS — R2689 Other abnormalities of gait and mobility: Secondary | ICD-10-CM | POA: Diagnosis not present

## 2022-12-06 DIAGNOSIS — R531 Weakness: Secondary | ICD-10-CM | POA: Diagnosis not present

## 2022-12-06 DIAGNOSIS — M6281 Muscle weakness (generalized): Secondary | ICD-10-CM | POA: Diagnosis not present

## 2022-12-06 DIAGNOSIS — E785 Hyperlipidemia, unspecified: Secondary | ICD-10-CM | POA: Diagnosis not present

## 2022-12-06 DIAGNOSIS — I5032 Chronic diastolic (congestive) heart failure: Secondary | ICD-10-CM | POA: Diagnosis not present

## 2022-12-06 DIAGNOSIS — E1169 Type 2 diabetes mellitus with other specified complication: Secondary | ICD-10-CM | POA: Diagnosis not present

## 2022-12-06 DIAGNOSIS — K219 Gastro-esophageal reflux disease without esophagitis: Secondary | ICD-10-CM | POA: Diagnosis not present

## 2022-12-06 DIAGNOSIS — R4182 Altered mental status, unspecified: Secondary | ICD-10-CM | POA: Diagnosis not present

## 2022-12-06 DIAGNOSIS — G9341 Metabolic encephalopathy: Secondary | ICD-10-CM | POA: Diagnosis not present

## 2022-12-06 DIAGNOSIS — R262 Difficulty in walking, not elsewhere classified: Secondary | ICD-10-CM | POA: Diagnosis not present

## 2022-12-06 DIAGNOSIS — E119 Type 2 diabetes mellitus without complications: Secondary | ICD-10-CM | POA: Diagnosis not present

## 2022-12-06 DIAGNOSIS — R1312 Dysphagia, oropharyngeal phase: Secondary | ICD-10-CM | POA: Diagnosis not present

## 2022-12-06 DIAGNOSIS — Z743 Need for continuous supervision: Secondary | ICD-10-CM | POA: Diagnosis not present

## 2022-12-06 DIAGNOSIS — I69852 Hemiplegia and hemiparesis following other cerebrovascular disease affecting left dominant side: Secondary | ICD-10-CM | POA: Diagnosis not present

## 2022-12-06 DIAGNOSIS — F419 Anxiety disorder, unspecified: Secondary | ICD-10-CM | POA: Diagnosis not present

## 2022-12-06 LAB — GLUCOSE, CAPILLARY
Glucose-Capillary: 237 mg/dL — ABNORMAL HIGH (ref 70–99)
Glucose-Capillary: 294 mg/dL — ABNORMAL HIGH (ref 70–99)
Glucose-Capillary: 265 mg/dL — ABNORMAL HIGH (ref 70–99)

## 2022-12-06 MED ORDER — INSULIN LISPRO 100 UNIT/ML IJ SOLN: INTRAMUSCULAR | Status: DC

## 2022-12-06 MED ORDER — BISACODYL 5 MG PO TBEC: 5.0000 mg | DELAYED_RELEASE_TABLET | Freq: Every day | ORAL | Status: DC | PRN

## 2022-12-06 MED ORDER — INSULIN LISPRO 100 UNIT/ML IJ SOLN: 4.0000 [IU] | Freq: Three times a day (TID) | INTRAMUSCULAR | Status: DC

## 2022-12-06 MED ORDER — LORAZEPAM 0.5 MG PO TABS: 0.5000 mg | ORAL_TABLET | Freq: Every day | ORAL | 0 refills | Status: DC

## 2022-12-06 MED ORDER — LANTUS SOLOSTAR 100 UNIT/ML ~~LOC~~ SOPN: 22.0000 [IU] | PEN_INJECTOR | Freq: Every day | SUBCUTANEOUS | Status: DC

## 2022-12-06 NOTE — Progress Notes (Signed)
Called report to [facility],

## 2022-12-06 NOTE — Plan of Care (Signed)
  Problem: Fluid Volume: Goal: Hemodynamic stability will improve Outcome: Adequate for Discharge   Problem: Clinical Measurements: Goal: Diagnostic test results will improve Outcome: Adequate for Discharge Goal: Signs and symptoms of infection will decrease Outcome: Adequate for Discharge   Problem: Respiratory: Goal: Ability to maintain adequate ventilation will improve Outcome: Adequate for Discharge   Problem: Education: Goal: Ability to describe self-care measures that may prevent or decrease complications (Diabetes Survival Skills Education) will improve Outcome: Adequate for Discharge Goal: Individualized Educational Video(s) Outcome: Adequate for Discharge   Problem: Coping: Goal: Ability to adjust to condition or change in health will improve Outcome: Adequate for Discharge   Problem: Fluid Volume: Goal: Ability to maintain a balanced intake and output will improve Outcome: Adequate for Discharge   Problem: Health Behavior/Discharge Planning: Goal: Ability to identify and utilize available resources and services will improve Outcome: Adequate for Discharge Goal: Ability to manage health-related needs will improve Outcome: Adequate for Discharge   Problem: Metabolic: Goal: Ability to maintain appropriate glucose levels will improve Outcome: Adequate for Discharge   Problem: Nutritional: Goal: Maintenance of adequate nutrition will improve Outcome: Adequate for Discharge Goal: Progress toward achieving an optimal weight will improve Outcome: Adequate for Discharge   Problem: Skin Integrity: Goal: Risk for impaired skin integrity will decrease Outcome: Adequate for Discharge   Problem: Tissue Perfusion: Goal: Adequacy of tissue perfusion will improve Outcome: Adequate for Discharge   Problem: Education: Goal: Knowledge of General Education information will improve Description: Including pain rating scale, medication(s)/side effects and non-pharmacologic  comfort measures Outcome: Adequate for Discharge   Problem: Health Behavior/Discharge Planning: Goal: Ability to manage health-related needs will improve Outcome: Adequate for Discharge   Problem: Clinical Measurements: Goal: Ability to maintain clinical measurements within normal limits will improve Outcome: Adequate for Discharge Goal: Will remain free from infection Outcome: Adequate for Discharge Goal: Diagnostic test results will improve Outcome: Adequate for Discharge Goal: Respiratory complications will improve Outcome: Adequate for Discharge Goal: Cardiovascular complication will be avoided Outcome: Adequate for Discharge   Problem: Activity: Goal: Risk for activity intolerance will decrease Outcome: Adequate for Discharge   Problem: Nutrition: Goal: Adequate nutrition will be maintained Outcome: Adequate for Discharge   Problem: Coping: Goal: Level of anxiety will decrease Outcome: Adequate for Discharge   Problem: Elimination: Goal: Will not experience complications related to bowel motility Outcome: Adequate for Discharge Goal: Will not experience complications related to urinary retention Outcome: Adequate for Discharge   Problem: Pain Managment: Goal: General experience of comfort will improve Outcome: Adequate for Discharge   Problem: Safety: Goal: Ability to remain free from injury will improve Outcome: Adequate for Discharge   Problem: Skin Integrity: Goal: Risk for impaired skin integrity will decrease Outcome: Adequate for Discharge   

## 2022-12-06 NOTE — Progress Notes (Signed)
Per order, PICC removed. Vaseline gauze applied to site and pressure dressing in place. Patient instructed to leave dressing in place for 24-48 hours. Primary RN notified.   Lori [name] R Ladelle Teodoro

## 2022-12-06 NOTE — Inpatient Diabetes Management (Signed)
Inpatient Diabetes Program Recommendations  AACE/ADA: New Consensus Statement on Inpatient Glycemic Control (2015)  Target Ranges:  Prepandial:   less than 140 mg/dL      Peak postprandial:   less than 180 mg/dL (1-2 hours)      Critically ill patients:  140 - 180 mg/dL   Lab Results  Component Value Date   GLUCAP 237 (H) 12/06/2022   HGBA1C 8.5 (H) 11/24/2022    Review of Glycemic Control  Latest Reference Range & Units 12/05/22 06:05 12/05/22 12:38 12/05/22 17:24 12/05/22 21:17 12/06/22 06:17  Glucose-Capillary 70 - 99 mg/dL 161 (H) 096 (H) 045 (H) 321 (H) 237 (H)    Diabetes history: DM Outpatient Diabetes medications: Novolog 15 units TID, Lantus 40 units every day, Ozempic 0.5 weeKly Current orders for Inpatient glycemic control:  Semglee 22 units every day Novolog 0-15 units TID + HS  Ensure enlive bid between meals   Inpatient Diabetes Program Recommendations:    -   consider Novolog 4 units BID with nutritional supplement as patient not consuming meals at >50%.   Will continue to follow while inpatient.   Thanks, Christena Deem RN, MSN, BC-ADM Inpatient Diabetes Coordinator Team Pager [phone] (8a-5p)

## 2022-12-06 NOTE — Discharge Summary (Signed)
Physician Discharge Summary   Patient: Lori [name] MRN: [id number] DOB: Jul 01, 1970  Admit date:     11/21/2022  Discharge date: 12/06/22  Discharge Physician: Rickey Barbara   PCP: Caesar Bookman, NP   Recommendations at discharge:    Follow up with PCP In 1-2 weeks Recommend outpatient endocrinology referral for thyroid and diabetes  Discharge Diagnoses: Principal Problem:   Acute metabolic encephalopathy Active Problems:   Nausea & vomiting   AKI (acute kidney injury) (HCC)   History of CVA with spastic paresis (cerebrovascular accident)   Hyperthyroidism   Essential hypertension   Insulin dependent type 2 diabetes mellitus (HCC)   Left hemiplegia (HCC)   History of CAD (coronary artery disease)   Chronic heart failure with preserved ejection fraction (HFpEF) (HCC)   Chronic anticoagulation   Tachycardia   Anxiety and depression   Aphasia   Debility   Hyponatremia   Hypotension   Delirium   Dehydration  Resolved Problems:   * No resolved hospital problems. *  Hospital Course: 52 year old female, with medical history significant for CVA with residual left hemiparesis/spasticity/dysphagia, DM2/IDDM, CAD, chronic diastolic CHF, HTN, anxiety/depression, pheochromocytoma s/p unilateral adrenalectomy in 2018, hospitalization from 9/29 - 10/3 for acute metabolic encephalopathy, presented to [facility] ED on 10/17 with altered mental status, nausea and vomiting. She was admitted for acute metabolic encephalopathy, AKI, intractable nausea and vomiting, hematemesis and tachycardia.  Postadmission on 10/18, became hypotensive with SBP in the 70s that recovered after IV fluid boluses.  Nausea, vomiting, dehydration and AKI resolved. Mental status has improved but not clear what is her baseline.Ongoing poor oral intake.  Sinus tachycardia suspected due to hyperthyroidism.  Ongoing poor oral intake.  Also awaiting SNF.   Assessment and Plan: Acute metabolic encephalopathy/agitation:   Likely due to dehydration and polypharmacy.   No clear source of infection.   CT head and ABG without acute finding.   Encephalopathy seems to have resolved.  Minimize opioids and sedative meds.  Delirium precautions.  Ongoing reorientation. Mental status had improved   Dehydration, nausea and vomiting:  Now resolved. CT chest, abdomen and pelvis without acute findings.  Abdominal exam benign.  Registered dietitian consulted for nutritional supplements.   Patient now on dysphagia 3 diet Continue to encourage p.o. intake.   Acute Kidney Injury:  Likely due to the above.   Baseline Cr ~0.9-1.0.   AKI resolved Push fluids orally.  Continue to encourage PO.   Positive gastric Hemoccult: Likely from Mallory-Weiss tear related to intractable nausea and vomiting, but cannot rule out gastritis or PUD.   Slight drop in Hgb likely dilutional from IV fluids.   Overall, H&H seems to be at baseline. Nausea and vomiting seems to have resolved. Changed Protonix from IV to Oral PPI Home Plavix and Xarelto have been resumed.  Monitor closely.   Dysphagia:  Speech therapy has advanced diet to dysphagia 3 diet and thin liquids. Encourage p.o. intake.   History of CVA with residual left hemiplegia/spasticity and dysarthria:  CT head without acute findings. Continue home Plavix and statin. PT and OT eval evaluated and recommended SNF. TOC on board.   History of CAD:  Serial troponin negative. EKG without acute ischemic findings. Continue home Plavix, Ranexa and statin.   Hypotension/history of essential hypertension:  Hypotension resolved.  Blood pressure elevated. Currently on metoprolol 50 Mg twice daily.   Sinus tachycardia/ Hyperthyroidism:  Clinically euvolemic.  Had sustained sinus tachycardia in the 120s-130s on 10/21.   Suspect related to underlying  hyperthyroidism TFTs 10/21: TSH 1.306, free T32.4, free T41.48. Continue current metoprolol 50 Mg twice daily and may need to  consider switching over to propranolol. Low suspicion for infection.  Increased methimazole from 5 mg daily to 10 mg twice daily.  Sinus tachycardia has resolved.   Outpatient follow-up with repeat TFTs in 3 to 4 weeks.  Also need to closely follow LFTs and CBCs as outpatient. Recommend outpatient endocrinology consultation or follow-up.  Do not think or know if she actually sees one currently.   Had seen an endocrinologist in 2019 but that was for her DM and pheochromocytoma follow-up.   Uncontrolled IDDM-2 with hyperglycemia:  HbA1c 8.5 on 10/20 Currently on Semglee 22 units daily, mealtime NovoLog 0-14 units 3 times daily and moderate SSI.   2 hypoglycemic episodes in the mid 50s.  Suspect due to poor oral intake.   History of diastolic CHF:  Appears euvolemic overall.   Anxiety and depression:  - Stable -Continue home meds.   Lactic acidosis:  Likely due to dehydration.  Infectious workup unrevealing.   Resolved.  Antibiotics discontinued.   Hypokalemia/hypomagnesemia/hypophosphatemia -Monitor replenish as appropriate   Pheochromocytoma, s/p right-sided unilateral adrenalectomy:   Chronic anticoagulation for DVT 2023 PTA was on prophylactic dose anticoagulation Xarelto 10 Mg daily, confirmed by pharmacy on home med rec, resumed.   Monitor closely for bleeding.  Duration of anticoagulation not clear,?  Lifelong.   Also on Plavix for CVA.  Obviously increases bleeding risk.   This needs to be closely evaluated as outpatient by her PCP.       Consultants: Neurology, Critical Care Procedures performed:   Disposition: Skilled nursing facility Diet recommendation:  Dysphagia type 3 Thin Liquid DISCHARGE MEDICATION: Allergies as of 12/06/2022       Reactions   Asa [aspirin] Anaphylaxis, Swelling   Throat closes   Contrast Media [iodinated Contrast Media] Anaphylaxis, Swelling   Imitrex [sumatriptan] Anaphylaxis   Penicillins Swelling   Mouth and eye swelling   Firvanq  [vancomycin] Other (See Comments)   Red Man's Syndrome   Cipro [ciprofloxacin Hcl] Nausea And Vomiting   Ms Contin [morphine] Swelling   Pork-derived Products Other (See Comments)   No known allergy but don't want to take it because of religious values   Wound Dressing Adhesive Itching, Rash        Medication List     STOP taking these medications    dicyclomine 20 MG tablet Commonly known as: BENTYL   insulin aspart 100 UNIT/ML injection Commonly known as: novoLOG       TAKE these medications    albuterol (2.5 MG/3ML) 0.083% nebulizer solution Commonly known as: PROVENTIL Take 3 mLs (2.5 mg total) by nebulization every 6 (six) hours as needed for wheezing or shortness of breath.   albuterol 108 (90 Base) MCG/ACT inhaler Commonly known as: VENTOLIN HFA INHALE 2 PUFFS BY MOUTH EVERY 6 HOURS AS NEEDED FOR WHEEZING AND FOR SHORTNESS OF BREATH   atorvastatin 80 MG tablet Commonly known as: LIPITOR Take 1 tablet (80 mg total) by mouth daily. What changed: when to take this   baclofen 10 MG tablet Commonly known as: LIORESAL TAKE 1 TABLET BY MOUTH 3 TIMES DAILY AT 9AM, 1PM AND 5PM FOR SPASTICITY   bisacodyl 5 MG EC tablet Commonly known as: DULCOLAX Take 1 tablet (5 mg total) by mouth daily as needed for moderate constipation.   blood glucose meter kit and supplies 1 each by Other route as directed. Dispense based on patient  and insurance preference. Use up to four times daily as directed. (FOR ICD-10 E10.9, E11.9).   budesonide 0.25 MG/2ML nebulizer solution Commonly known as: PULMICORT Take 2 mLs (0.25 mg total) by nebulization 2 (two) times daily.   clopidogrel 75 MG tablet Commonly known as: PLAVIX Take 1 tablet (75 mg total) by mouth daily.   cyclobenzaprine 5 MG tablet Commonly known as: FLEXERIL TAKE 1 TABLET BY MOUTH 3 TIMES DAILY AS NEEDED FOR MUSCLE SPASMS   diclofenac Sodium 1 % Gel Commonly known as: VOLTAREN Apply 2 g topically 4 (four) times  daily.   escitalopram 20 MG tablet Commonly known as: LEXAPRO Take 1 tablet (20 mg total) by mouth every morning.   ezetimibe 10 MG tablet Commonly known as: Zetia Take 1 tablet (10 mg total) by mouth daily. What changed: when to take this   FreeStyle Libre 14 Day Sensor Misc 1 each by Does not apply route as needed. DX: E11.69   FreeStyle Libre 2 Reader Devi 1 Device by Does not apply route daily. E11.69   gabapentin 100 MG capsule Commonly known as: NEURONTIN Take 100 mg by mouth 3 (three) times daily.   insulin lispro 100 UNIT/ML injection Commonly known as: HUMALOG Inject 0.04 mLs (4 Units total) into the skin 3 (three) times daily with meals. What changed: how much to take   insulin lispro 100 UNIT/ML injection Commonly known as: HUMALOG Sliding scale AC, HS CBG 121 - 150: 2 units  CBG 151 - 200: 3 units  CBG 201 - 250: 5 units  CBG 251 - 300: 8 units  CBG 301 - 350: 11 units  CBG 351 - 400: 15 units  CBG > 400: call MD What changed: You were already taking a medication with the same name, and this prescription was added. Make sure you understand how and when to take each.   Insulin Pen Needle 32G X 4 MM Misc Use to inject insulin 4 times daily as directed.   Lantus SoloStar 100 UNIT/ML Solostar Pen Generic drug: insulin glargine Inject 22 Units into the skin daily. What changed: See the new instructions.   LORazepam 0.5 MG tablet Commonly known as: ATIVAN Take 1 tablet (0.5 mg total) by mouth at bedtime.   methimazole 5 MG tablet Commonly known as: TAPAZOLE Take 1 tablet (5 mg total) by mouth every morning.   nystatin powder Commonly known as: MYCOSTATIN/NYSTOP Apply 1 Application topically 2 (two) times daily as needed (irritation under breast/skin folds).   omeprazole 20 MG capsule Commonly known as: PRILOSEC Take 1 capsule (20 mg total) by mouth daily.   polyethylene glycol powder 17 GM/SCOOP powder Commonly known as: GLYCOLAX/MIRALAX Take 17 g  by mouth daily. Hold for loose stool What changed:  when to take this reasons to take this additional instructions   ranolazine 500 MG 12 hr tablet Commonly known as: RANEXA Take 1 tablet (500 mg total) by mouth 2 (two) times daily.   rivaroxaban 10 MG Tabs tablet Commonly known as: XARELTO Take 1 tablet (10 mg total) by mouth daily.   senna-docusate 8.6-50 MG tablet Commonly known as: Senokot-S Take 1 tablet by mouth daily. What changed:  when to take this reasons to take this   tamsulosin 0.4 MG Caps capsule Commonly known as: FLOMAX Take 0.4 mg by mouth at bedtime.   topiramate 25 MG tablet Commonly known as: TOPAMAX Take 1 tablet (25 mg total) by mouth 2 (two) times daily.   zinc oxide 11.3 % Crea cream  Commonly known as: BALMEX Apply 1 Application topically 2 (two) times daily.        Contact information for after-discharge care     Destination     [facility] Preferred SNF .   Service: Skilled Nursing Contact information: [address] [place] [address] [phone]                    Discharge Exam: Ceasar Mons Weights   11/29/22 0500 11/30/22 0508 12/03/22 0500  Weight: 90.7 kg 92.8 kg 91.5 kg   General exam: Awake, laying in bed, in nad Respiratory system: Normal respiratory effort, no wheezing Cardiovascular system: regular rate, s1, s2 Gastrointestinal system: Soft, nondistended, positive BS Central nervous system: CN2-12 grossly intact, strength intact Extremities: Perfused, no clubbing Skin: Normal skin turgor, no notable skin lesions seen Psychiatry: Mood normal // no visual hallucinations   Condition at discharge: fair  The results of significant diagnostics from this hospitalization (including imaging, microbiology, ancillary and laboratory) are listed below for reference.   Imaging Studies: [place] EKG SITE RITE  Result Date: 11/23/2022 If Site Rite image not attached, placement  could not be confirmed due to current cardiac rhythm.  CT CHEST ABDOMEN PELVIS WO CONTRAST  Result Date: 11/22/2022 CLINICAL DATA:  Sepsis and mental status change EXAM: CT CHEST, ABDOMEN AND PELVIS WITHOUT CONTRAST TECHNIQUE: Multidetector CT imaging of the chest, abdomen and pelvis was performed following the standard protocol without IV contrast. RADIATION DOSE REDUCTION: This exam was performed according to the departmental dose-optimization program which includes automated exposure control, adjustment of the mA and/or kV according to patient size and/or use of iterative reconstruction technique. COMPARISON:  Abdominal CT 10/31/2022 FINDINGS: CT CHEST FINDINGS Cardiovascular: No acute vascular findings. Normal heart size. No pericardial effusion. Extensive atheromatous calcification of the aorta and coronaries. Prior CABG. Aberrant right subclavian artery. Mediastinum/Nodes: No mass or adenopathy. Enteric tube which traverses the esophagus. Small sliding hiatal hernia. Lungs/Pleura: Band of opacity in the right lower lobe is stable from prior and attributed to scar. Low volume lungs, mild hazy densities likely atelectasis with motion. There is no edema, consolidation, effusion, or pneumothorax. Musculoskeletal: No acute finding CT ABDOMEN PELVIS FINDINGS Lower chest:  No contributory findings. Hepatobiliary: No focal liver abnormality.No evidence of biliary obstruction or stone. Pancreas: Calcifications are likely vascular.  No acute finding. Spleen: Unremarkable. Adrenals/Urinary Tract: Negative adrenals. No hydronephrosis or stone. Unremarkable bladder. Stomach/Bowel: No obstruction. No appendicitis. Duodenal diverticulum, uncomplicated. Vascular/Lymphatic: No acute vascular abnormality. Extensive atheromatous calcification of the aorta and iliacs. No mass or adenopathy. Reproductive:No pathologic findings. Other: No ascites or pneumoperitoneum. Spiculated density in the lower midline abdominal wall  remain stable consistent with scarring. Musculoskeletal: No acute abnormalities. IMPRESSION: 1. No acute finding or specific cause for history of sepsis. 2. Advanced atherosclerosis. Electronically Signed   By: Tiburcio Pea M.D.   On: 11/22/2022 04:03   CT HEAD WO CONTRAST ( )  Result Date: 11/22/2022 CLINICAL DATA:  Mental status change, sepsis EXAM: CT HEAD WITHOUT CONTRAST TECHNIQUE: Contiguous axial images were obtained from the base of the skull through the vertex without intravenous contrast. RADIATION DOSE REDUCTION: This exam was performed according to the departmental dose-optimization program which includes automated exposure control, adjustment of the mA and/or kV according to patient size and/or use of iterative reconstruction technique. COMPARISON:  11/03/2022 FINDINGS: Brain: No evidence of acute infarction, hemorrhage, mass, mass effect, or midline shift. No hydrocephalus or extra-axial fluid collection. Redemonstrated chronic left frontal and left cerebellar  infarcts. Additional remote lacunar infarcts in the bilateral basal ganglia and thalami and in the right cerebellum. Periventricular white matter changes, likely the sequela of chronic small vessel ischemic disease. Vascular: No hyperdense vessel. Atherosclerotic calcifications in the intracranial carotid and vertebral arteries. Skull: Negative for fracture or focal lesion. Sinuses/Orbits: No acute finding. Other: The mastoid air cells are well aerated.  Nasogastric tube. IMPRESSION: No acute intracranial process. Electronically Signed   By: Wiliam Ke M.D.   On: 11/22/2022 03:53   DG Abdomen 1 View  Result Date: 11/22/2022 CLINICAL DATA:  NG tube placement EXAM: ABDOMEN - 1 VIEW COMPARISON:  10/31/2022 FINDINGS: Limited field of view for tube placement verification. An enteric tube has been placed with tip projecting over the upper abdomen at the midline consistent with location in the body of the stomach. Visualized bowel gas  pattern is normal without gaseous distention of bowel demonstrated. Lung bases are clear. Postoperative changes in the mediastinum. IMPRESSION: Enteric tube tip projects over the upper abdomen consistent with location in the body of the stomach. Electronically Signed   By: Burman Nieves M.D.   On: 11/22/2022 03:31   DG Chest Port 1 View  Result Date: 11/21/2022 CLINICAL DATA:  Question of sepsis to evaluate for abnormality. Low blood sugar. EXAM: PORTABLE CHEST 1 VIEW COMPARISON:  11/04/2022 FINDINGS: Postoperative changes in the mediastinum. Shallow inspiration. Heart size and pulmonary vascularity are normal for technique. Developing linear atelectasis or infiltration in the mid lungs. No pleural effusions. No pneumothorax. 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 Calcification of the aorta. IMPRESSION: Shallow inspiration with developing linear atelectasis or infiltration in the mid lungs. Electronically Signed   By: Burman Nieves M.D.   On: 11/21/2022 23:25    Microbiology: Results for orders placed or performed during the hospital encounter of 11/21/22  Blood Culture (routine x 2)     Status: None   Collection Time: 11/22/22 12:19 AM   Specimen: BLOOD RIGHT ARM  Result Value Ref Range Status   Specimen Description BLOOD RIGHT ARM  Final   Special Requests   Final    BOTTLES DRAWN AEROBIC AND ANAEROBIC Blood Culture adequate volume   Culture   Final    NO GROWTH 5 DAYS Performed at [facility], [address] [place], [place] [address]    Report Status 11/27/2022 FINAL  Final  Resp panel by RT-PCR (RSV, Flu A&B, Covid) Anterior Nasal Swab     Status: None   Collection Time: 11/22/22 12:19 AM   Specimen: Anterior Nasal Swab  Result Value Ref Range Status   SARS Coronavirus 2 by RT PCR NEGATIVE NEGATIVE Final   Influenza A by PCR NEGATIVE NEGATIVE Final   Influenza B by PCR NEGATIVE NEGATIVE Final    Comment: (NOTE) The Xpert Xpress SARS-CoV-2/FLU/RSV plus assay is intended as an  aid in the diagnosis of influenza from Nasopharyngeal swab specimens and should not be used as a sole basis for treatment. Nasal washings and aspirates are unacceptable for Xpert Xpress SARS-CoV-2/FLU/RSV testing.  Fact Sheet for Patients: BloggerCourse.com  Fact Sheet for Healthcare Providers: SeriousBroker.it  This test is not yet approved or cleared by the [place] FDA and has been authorized for detection and/or diagnosis of SARS-CoV-2 by FDA under an Emergency Use Authorization (EUA). This EUA will remain in effect (meaning this test can be used) for the duration of the COVID-19 declaration under Section 564(b)(1) of the Act, 21 U.S.C. section 360bbb-3(b)(1), unless the authorization is terminated or revoked.  Resp Syncytial Virus by PCR NEGATIVE NEGATIVE Final    Comment: (NOTE) Fact Sheet for Patients: BloggerCourse.com  Fact Sheet for Healthcare Providers: SeriousBroker.it  This test is not yet approved or cleared by the [place] FDA and has been authorized for detection and/or diagnosis of SARS-CoV-2 by FDA under an Emergency Use Authorization (EUA). This EUA will remain in effect (meaning this test can be used) for the duration of the COVID-19 declaration under Section 564(b)(1) of the Act, 21 U.S.C. section 360bbb-3(b)(1), unless the authorization is terminated or revoked.  Performed at [facility], [address], [place], [place] [address]   Blood Culture (routine x 2)     Status: None   Collection Time: 11/22/22  1:41 AM   Specimen: BLOOD RIGHT HAND  Result Value Ref Range Status   Specimen Description BLOOD RIGHT HAND  Final   Special Requests   Final    BOTTLES DRAWN AEROBIC ONLY Blood Culture adequate volume   Culture   Final    NO GROWTH 5 DAYS Performed at [facility], [address], [place], [place] [address]    Report  Status 11/27/2022 FINAL  Final    Labs: CBC: Recent Labs  Lab 12/02/22 0530 12/05/22 0500  WBC 8.5 8.6  HGB 9.7* 9.5*  HCT 30.1* 29.4*  MCV 80.7 80.1  PLT 424* 426*   Basic Metabolic Panel: Recent Labs  Lab 12/02/22 0530 12/05/22 0500  NA 136 135  K 4.0 3.8  CL 105 105  CO2 23 21*  GLUCOSE 196* 189*  BUN 20 18  CREATININE 0.94 0.93  CALCIUM 8.9 8.7*  MG 1.7  --   PHOS 4.1  --    Liver Function Tests: Recent Labs  Lab 12/02/22 0530 12/05/22 0500  AST 11* 14*  ALT 13 18  ALKPHOS 55 55  BILITOT 0.3 0.3  PROT 6.3* 6.0*  ALBUMIN 3.0* 2.8*   CBG: Recent Labs  Lab 12/05/22 1238 12/05/22 1724 12/05/22 2117 12/06/22 0617 12/06/22 1149  GLUCAP 237* 293* 321* 237* 294*    Discharge time spent: less than 30 minutes.  Signed: Rickey Barbara, MD [facility] 12/06/2022

## 2022-12-06 NOTE — Consult Note (Signed)
[facility] Liaison Note  12/06/2022  Lori [name] 11-Aug-1970 [id number]  Covering Charlesetta Shanks, RN [facility] Liaison at [facility])  Location: [facility] Liaison screened the patient remotely at [facility].  Insurance: [employer]   [name], [name] is a 52 y.o. female who is a Optician, dispensing Care Patient of [place], Donalee Citrin, NP- [facility]. The patient was screened for 30 day readmission hospitalization with noted extreme risk score for unplanned readmission risk with  5 IP/ 2 ED in 6 months.  The patient was assessed for potential Care Management service needs for post hospital transition for care coordination. Review of patient's electronic medical record reveals patient was admitted for acute metabolic encephalopathy. Pt will discharged to SNF level of care. Liaison will collaborate with the PAC-RN to follow at the networking facility.  Plan: [facility] Liaison will continue to follow progress and disposition to asess for post hospital community care coordination/management needs.  Referral request for community care coordination: Will refer to PAC-RN with [facility] to follow for care management services   [facility] Care Management/Population Health does not replace or interfere with any arrangements made by the Inpatient Transition of Care team.   For questions contact:   Elliot Cousin, RN, [facility] Liaison [place]   [facility], Population Health Office Hours MTWF  8:00 am-6:00 pm Direct Dial: [phone] mobile [phone] [Office toll free line] Office Hours are M-F 8:30 - 5 pm [email or web address]

## 2022-12-06 NOTE — TOC Progression Note (Signed)
Transition of Care [facility]) - Progression Note    Patient Details  Name: Lori [name] MRN: [id number] Date of Birth: 05-12-1970  Transition of Care [facility]) CM/SW Contact  Baldemar Lenis, [place] Phone Number: 12/06/2022, 11:33 AM  Clinical Narrative:   CSW attempted to reach [facility] one more time, left a voicemail requesting call back. No response received. CSW met with patient to discuss unable to reach [facility], and patient said she would choose [facility] as a second option. CSW confirmed with [facility] that bed is available, and requested CMA to initiate insurance authorization. CSW to follow.    Expected Discharge Plan: Skilled Nursing Facility Barriers to Discharge: Continued Medical Work up, English as a second language teacher  Expected Discharge Plan and Services     Post Acute Care Choice: Skilled Nursing Facility Living arrangements for the past 2 months: Single Family Home                                       Social Determinants of Health (SDOH) Interventions SDOH Screenings   Food Insecurity: No Food Insecurity (11/27/2022)  Housing: Patient Declined (11/27/2022)  Transportation Needs: No Transportation Needs (11/27/2022)  Utilities: Not At Risk (11/27/2022)  Depression (PHQ2-9): Low Risk  (11/01/2022)  Recent Concern: Depression (PHQ2-9) - Medium Risk (10/28/2022)  Tobacco Use: Low Risk  (11/27/2022)    Readmission Risk Interventions    11/05/2022    1:38 PM 10/15/2022   10:25 AM 08/29/2021    2:32 PM  Readmission Risk Prevention Plan  Transportation Screening Complete Complete Complete  Medication Review Oceanographer) Complete  Complete  PCP or Specialist appointment within 3-5 days of discharge Complete Complete Complete  HRI or Home Care Consult Complete Complete Complete  SW Recovery Care/Counseling Consult Complete Complete Complete  Palliative Care Screening Not Applicable Not Applicable Not Applicable  Skilled Nursing Facility Not  Applicable Not Applicable Complete

## 2022-12-06 NOTE — Plan of Care (Signed)
  Problem: Coping: Goal: Ability to adjust to condition or change in health will improve Outcome: Progressing   

## 2022-12-06 NOTE — TOC Transition Note (Signed)
Transition of Care [facility]) - CM/SW Discharge Note   Patient Details  Name: Lori [name] MRN: [id number] Date of Birth: 1971-01-03  Transition of Care [facility]) CM/SW Contact:  Arrin Ishler Felipa Emory, Student-Social Work Phone Number: 12/06/2022, 1:58 PM   Clinical Narrative:   MSW Student received insurance approval for patient to admit to SNF. Msw Student confirmed with MD taht patient is stable for discharge. MSW Student notified patient and they are in agreement with discharge. MSW Student confirmed bed Is available at [facility]. Transport arranged with [facility] for next available.  Number to call report : [phone]    Final next level of care: Skilled Nursing Facility Barriers to Discharge: Barriers Resolved   Patient Goals and CMS Choice CMS Medicare.gov Compare Post Acute Care list provided to:: Patient Represenative (must comment) Choice offered to / list presented to : Spouse  Discharge Placement                Patient chooses bed at: [facility] Patient to be transferred to facility by: [facility] Name of family member notified: Self Patient and family notified of of transfer: 12/06/22  Discharge Plan and Services Additional resources added to the After Visit Summary for       Post Acute Care Choice: Skilled Nursing Facility                               Social Determinants of Health (SDOH) Interventions SDOH Screenings   Food Insecurity: No Food Insecurity (11/27/2022)  Housing: Patient Declined (11/27/2022)  Transportation Needs: No Transportation Needs (11/27/2022)  Utilities: Not At Risk (11/27/2022)  Depression (PHQ2-9): Low Risk  (11/01/2022)  Recent Concern: Depression (PHQ2-9) - Medium Risk (10/28/2022)  Tobacco Use: Low Risk  (11/27/2022)     Readmission Risk Interventions    11/05/2022    1:38 PM 10/15/2022   10:25 AM 08/29/2021    2:32 PM  Readmission Risk Prevention Plan  Transportation Screening Complete Complete  Complete  Medication Review Oceanographer) Complete  Complete  PCP or Specialist appointment within 3-5 days of discharge Complete Complete Complete  HRI or Home Care Consult Complete Complete Complete  SW Recovery Care/Counseling Consult Complete Complete Complete  Palliative Care Screening Not Applicable Not Applicable Not Applicable  Skilled Nursing Facility Not Applicable Not Applicable Complete

## 2022-12-08 DIAGNOSIS — E119 Type 2 diabetes mellitus without complications: Secondary | ICD-10-CM | POA: Diagnosis not present

## 2022-12-08 DIAGNOSIS — K219 Gastro-esophageal reflux disease without esophagitis: Secondary | ICD-10-CM | POA: Diagnosis not present

## 2022-12-08 DIAGNOSIS — J449 Chronic obstructive pulmonary disease, unspecified: Secondary | ICD-10-CM | POA: Diagnosis not present

## 2022-12-09 DIAGNOSIS — J449 Chronic obstructive pulmonary disease, unspecified: Secondary | ICD-10-CM | POA: Diagnosis not present

## 2022-12-09 DIAGNOSIS — K219 Gastro-esophageal reflux disease without esophagitis: Secondary | ICD-10-CM | POA: Diagnosis not present

## 2022-12-09 DIAGNOSIS — E119 Type 2 diabetes mellitus without complications: Secondary | ICD-10-CM | POA: Diagnosis not present

## 2022-12-10 ENCOUNTER — Telehealth: Payer: Self-pay | Admitting: Pharmacist

## 2022-12-10 ENCOUNTER — Encounter: Payer: Self-pay | Admitting: Student

## 2022-12-10 ENCOUNTER — Ambulatory Visit: Payer: 59 | Attending: Internal Medicine | Admitting: Student

## 2022-12-10 VITALS — BP 145/85

## 2022-12-10 DIAGNOSIS — E119 Type 2 diabetes mellitus without complications: Secondary | ICD-10-CM | POA: Diagnosis not present

## 2022-12-10 DIAGNOSIS — I5032 Chronic diastolic (congestive) heart failure: Secondary | ICD-10-CM | POA: Diagnosis not present

## 2022-12-10 DIAGNOSIS — E1169 Type 2 diabetes mellitus with other specified complication: Secondary | ICD-10-CM | POA: Diagnosis not present

## 2022-12-10 DIAGNOSIS — K219 Gastro-esophageal reflux disease without esophagitis: Secondary | ICD-10-CM | POA: Diagnosis not present

## 2022-12-10 DIAGNOSIS — I69852 Hemiplegia and hemiparesis following other cerebrovascular disease affecting left dominant side: Secondary | ICD-10-CM | POA: Diagnosis not present

## 2022-12-10 DIAGNOSIS — E785 Hyperlipidemia, unspecified: Secondary | ICD-10-CM

## 2022-12-10 DIAGNOSIS — J449 Chronic obstructive pulmonary disease, unspecified: Secondary | ICD-10-CM | POA: Diagnosis not present

## 2022-12-10 NOTE — Telephone Encounter (Signed)
PCSK9i PA Requested

## 2022-12-10 NOTE — Patient Instructions (Signed)
Your Results:             Your most recent labs Goal  Total Cholesterol 132  < 200  Triglycerides 132 < 150  HDL (happy/good cholesterol) 35 > 40  LDL (lousy/bad cholesterol 66 < 55   Medication changes: We will start the process to get PCSK9i (Repatha or Praluent)  covered by your insurance.  Once the prior authorization is complete, we will call you to let you know and confirm pharmacy information.    Praluent is a cholesterol medication that improved your body's ability to get rid of "bad cholesterol" known as LDL. It can lower your LDL up to 60%. It is an injection that is given under the skin every 2 weeks. The most common side effects of Praluent include runny nose, symptoms of the common cold, rarely flu or flu-like symptoms, back/muscle pain in about 3-4% of the patients, and redness, pain, or bruising at the injection site.    Repatha is a cholesterol medication that improved your body's ability to get rid of "bad cholesterol" known as LDL. It can lower your LDL up to 60%! It is an injection that is given under the skin every 2 weeks. The medication often requires a prior authorization from your insurance company. The most common side effects of Repatha include runny nose, symptoms of the common cold, rarely flu or flu-like symptoms, back/muscle pain in about 3-4% of the patients, and redness, pain, or bruising at the injection site.   Lab orders: We want to repeat labs after 2-3 months.  We will send you a lab order to remind you once we get closer to that time.

## 2022-12-10 NOTE — Assessment & Plan Note (Signed)
Assessment:  LDL goal: < 55 mg/dl last LDLc  66  mg/dl (86/5784) Tolerates Zetia and high intensity statins well without any side effects  Discussed next potential options (PCSK-9 inhibitors, bempedoic acid and inclisiran); cost, dosing efficacy, side effects  Plan: Continue taking current medications (atorvastatin 80, ezetimibe 10 mg daily) Will apply for PA for PCSK9i; will inform patient upon approval  Lipid lab and Lp(a)due in 2-3 months after starting [facility]

## 2022-12-10 NOTE — Progress Notes (Unsigned)
Patient ID: Lori [name]                 DOB: [birth year]                    MRN: [id number]      HPI: Lori [name] is a 52 y.o. female patient referred to lipid clinic by Perlie Gold PA-C. PMH is significant for CAD, HFpEF, HTN, Diabetes, diabetes gastroparesis, hx of CVA, MDD, HLD    Patient presented with her husband today. Reports she is in rehab post hospitalization ( AKI and Stroke) her BP is running high as they took her off of all her BP medications. The nurses/doctor at rehab keeping an eye on her BP. Patient nose was bleeding from past 1 hour encouraged her to put some pressure. Advised her to talk to nurses at rehab and if bleeding dose not stop she might need to go to urgent care. She is wheel chair bound so unable to exercise much but she eats healthy. Reports she takes Lipitor and Zetia regularly and tolerates them well. We discussed risk factors and LDLc goal. Reviewed options for lowering LDL cholesterol, including , PCSK-9 inhibitors, bempedoic acid and inclisiran.  Discussed mechanisms of action, dosing, side effects and potential decreases in LDL cholesterol.  Also reviewed cost information and potential options for patient assistance.  Current Medications: atorvastatin 80, ezetimibe 10 mg daily Intolerances: none  Risk Factors: Diabetes, premature CAD, hx of CVA, HLD, elevated Lp(a) LDL goal: <55 mg/dl  Last Lab : LDLc 66, TC 132, TG 157, HDL 35    Family History:  Relation Problem Comments  Mother Metallurgist) Atrial fibrillation   Diabetes   Hypertension,stroke      Father (Alive) Dementia   Heart attack   Hypertension, stroke      Brother Brain cancer   Diabetes, stroke at age of 74      Maternal Uncle Atrial fibrillation     Daughter Asthma     Son Sickle cell anemia      Social History:  Alcohol: none  Smoking: none   Labs: Lipid Panel     Component Value Date/Time   CHOL 132 10/07/2022 0311   CHOL 148 03/01/2022 0828   TRIG 157 (H)  10/07/2022 0311   HDL 35 (L) 10/07/2022 0311   HDL 44 03/01/2022 0828   CHOLHDL 3.8 10/07/2022 0311   VLDL 31 10/07/2022 0311   LDLCALC 66 10/07/2022 0311   LDLCALC 79 03/01/2022 0828   LDLCALC 70 10/23/2021 1137   LABVLDL 25 03/01/2022 0828    Past Medical History:  Diagnosis Date   CAD (coronary artery disease)    Depression    Diabetes mellitus without complication (HCC)    History of CT scan    History of mammogram    History of MRI    Hypertension    Pheochromocytoma    Stroke [facility])    Thyroid disease     Current Outpatient Medications on File Prior to Visit  Medication Sig Dispense Refill   albuterol (PROVENTIL) (2.5 MG/3ML) 0.083% nebulizer solution Take 3 mLs (2.5 mg total) by nebulization every 6 (six) hours as needed for wheezing or shortness of breath. 75 mL 12   albuterol (VENTOLIN HFA) 108 (90 Base) MCG/ACT inhaler INHALE 2 PUFFS BY MOUTH EVERY 6 HOURS AS NEEDED FOR WHEEZING AND FOR SHORTNESS OF BREATH 6.7 g 10   atorvastatin (LIPITOR) 80 MG tablet Take 1 tablet (80 mg total) by mouth  daily. (Patient taking differently: Take 80 mg by mouth at bedtime.) 90 tablet 1   baclofen (LIORESAL) 10 MG tablet TAKE 1 TABLET BY MOUTH 3 TIMES DAILY AT 9AM, 1PM AND 5PM FOR SPASTICITY 90 tablet 10   bisacodyl (DULCOLAX) 5 MG EC tablet Take 1 tablet (5 mg total) by mouth daily as needed for moderate constipation.     blood glucose meter kit and supplies 1 each by Other route as directed. Dispense based on patient and insurance preference. Use up to four times daily as directed. (FOR ICD-10 E10.9, E11.9). 1 each 0   budesonide (PULMICORT) 0.25 MG/2ML nebulizer solution Take 2 mLs (0.25 mg total) by nebulization 2 (two) times daily. 60 mL 12   clopidogrel (PLAVIX) 75 MG tablet Take 1 tablet (75 mg total) by mouth daily. 90 tablet 1   Continuous Glucose Receiver (FREESTYLE LIBRE 2 READER) DEVI 1 Device by Does not apply route daily. E11.69 1 each 11   Continuous Glucose Sensor (FREESTYLE  LIBRE 14 DAY SENSOR) MISC 1 each by Does not apply route as needed. DX: E11.69 2 each 12   cyclobenzaprine (FLEXERIL) 5 MG tablet TAKE 1 TABLET BY MOUTH 3 TIMES DAILY AS NEEDED FOR MUSCLE SPASMS 90 tablet 10   diclofenac Sodium (VOLTAREN) 1 % GEL Apply 2 g topically 4 (four) times daily.     escitalopram (LEXAPRO) 20 MG tablet Take 1 tablet (20 mg total) by mouth every morning. 90 tablet 1   ezetimibe (ZETIA) 10 MG tablet Take 1 tablet (10 mg total) by mouth daily. (Patient taking differently: Take 10 mg by mouth at bedtime.) 90 tablet 1   gabapentin (NEURONTIN) 100 MG capsule Take 100 mg by mouth 3 (three) times daily.     insulin glargine (LANTUS SOLOSTAR) 100 UNIT/ML Solostar Pen Inject 22 Units into the skin daily.     insulin lispro (HUMALOG) 100 UNIT/ML injection Inject 0.04 mLs (4 Units total) into the skin 3 (three) times daily with meals.     insulin lispro (HUMALOG) 100 UNIT/ML injection Sliding scale AC, HS CBG 121 - 150: 2 units  CBG 151 - 200: 3 units  CBG 201 - 250: 5 units  CBG 251 - 300: 8 units  CBG 301 - 350: 11 units  CBG 351 - 400: 15 units  CBG > 400: call MD     Insulin Pen Needle 32G X 4 MM MISC Use to inject insulin 4 times daily as directed. 200 each 0   LORazepam (ATIVAN) 0.5 MG tablet Take 1 tablet (0.5 mg total) by mouth at bedtime. 5 tablet 0   methimazole (TAPAZOLE) 5 MG tablet Take 1 tablet (5 mg total) by mouth every morning. 90 tablet 1   nystatin (MYCOSTATIN/NYSTOP) powder Apply 1 Application topically 2 (two) times daily as needed (irritation under breast/skin folds).     omeprazole (PRILOSEC) 20 MG capsule Take 1 capsule (20 mg total) by mouth daily. 90 capsule 1   polyethylene glycol powder (GLYCOLAX/MIRALAX) 17 GM/SCOOP powder Take 17 g by mouth daily. Hold for loose stool (Patient taking differently: Take 17 g by mouth daily as needed (constipation).) 3350 g 1   ranolazine (RANEXA) 500 MG 12 hr tablet Take 1 tablet (500 mg total) by mouth 2 (two) times  daily. 180 tablet 0   rivaroxaban (XARELTO) 10 MG TABS tablet Take 1 tablet (10 mg total) by mouth daily. 90 tablet 1   senna-docusate (SENOKOT-S) 8.6-50 MG tablet Take 1 tablet by mouth daily. (Patient taking differently:  Take 1 tablet by mouth daily as needed (constipation).) 30 tablet 0   tamsulosin (FLOMAX) 0.4 MG CAPS capsule Take 0.4 mg by mouth at bedtime.     topiramate (TOPAMAX) 25 MG tablet Take 1 tablet (25 mg total) by mouth 2 (two) times daily. 10 tablet 0   zinc oxide (BALMEX) 11.3 % CREA cream Apply 1 Application topically 2 (two) times daily.     No current facility-administered medications on file prior to visit.    Allergies  Allergen Reactions   Asa [Aspirin] Anaphylaxis and Swelling    Throat closes   Contrast Media [Iodinated Contrast Media] Anaphylaxis and Swelling   Imitrex [Sumatriptan] Anaphylaxis   Penicillins Swelling    Mouth and eye swelling   Firvanq [Vancomycin] Other (See Comments)    Red Man's Syndrome   Cipro [Ciprofloxacin Hcl] Nausea And Vomiting   Ms Contin [Morphine] Swelling   Pork-Derived Products Other (See Comments)    No known allergy but don't want to take it because of religious values   Wound Dressing Adhesive Itching and Rash    Assessment/Plan:  1. Hyperlipidemia -  Problem  Hld (Hyperlipidemia)   Current Medications: atorvastatin 80, ezetimibe 10 mg daily Intolerances: none  Risk Factors: Diabetes, premature CAD, hx of CVA, HLD, elevated Lp(a) LDL goal: <55 mg/dl  Last Lab : LDLc 66, TC 132, TG 157, HDL 35     HLD (hyperlipidemia) Assessment:  LDL goal: < 55 mg/dl last LDLc  66  mg/dl (40/9811) Tolerates Zetia and high intensity statins well without any side effects  Discussed next potential options (PCSK-9 inhibitors, bempedoic acid and inclisiran); cost, dosing efficacy, side effects  Plan: Continue taking current medications (atorvastatin 80, ezetimibe 10 mg daily) Will apply for PA for PCSK9i; will inform patient upon  approval  Lipid lab and Lp(a)due in 2-3 months after starting PCSK9i    Thank you,  Carmela Hurt, Pharm.D [facility] A Division of [facility] [address] [place], [place] [address]  Phone: [phone]; Fax: [phone]

## 2022-12-11 ENCOUNTER — Other Ambulatory Visit (HOSPITAL_COMMUNITY): Payer: Self-pay

## 2022-12-11 ENCOUNTER — Telehealth: Payer: Self-pay | Admitting: Pharmacy Technician

## 2022-12-11 NOTE — Telephone Encounter (Signed)
Pharmacy Patient Advocate Encounter  Received notification from [employer] that Prior Authorization for repatha has been APPROVED from 12/11/22 to 03/11/23   PA #/Case ID/Reference #: [id number]

## 2022-12-11 NOTE — Telephone Encounter (Addendum)
Pharmacy Patient Advocate Encounter   Received notification from Pt Calls Messages that prior authorization for repatha is required/requested.   Insurance verification completed.   The patient is insured through [employer].   Per test claim: PA required; PA submitted to above mentioned insurance via CoverMyMeds Key/confirmation #/EOC [id number] Status is pending

## 2022-12-12 DIAGNOSIS — J449 Chronic obstructive pulmonary disease, unspecified: Secondary | ICD-10-CM | POA: Diagnosis not present

## 2022-12-12 DIAGNOSIS — E119 Type 2 diabetes mellitus without complications: Secondary | ICD-10-CM | POA: Diagnosis not present

## 2022-12-12 DIAGNOSIS — K219 Gastro-esophageal reflux disease without esophagitis: Secondary | ICD-10-CM | POA: Diagnosis not present

## 2022-12-13 DIAGNOSIS — G9341 Metabolic encephalopathy: Secondary | ICD-10-CM | POA: Diagnosis not present

## 2022-12-13 DIAGNOSIS — K219 Gastro-esophageal reflux disease without esophagitis: Secondary | ICD-10-CM | POA: Diagnosis not present

## 2022-12-13 DIAGNOSIS — E119 Type 2 diabetes mellitus without complications: Secondary | ICD-10-CM | POA: Diagnosis not present

## 2022-12-13 DIAGNOSIS — J449 Chronic obstructive pulmonary disease, unspecified: Secondary | ICD-10-CM | POA: Diagnosis not present

## 2022-12-16 NOTE — Telephone Encounter (Signed)
This patient needs hospital follow up appointment , routed to scheduling pool

## 2022-12-17 DIAGNOSIS — J449 Chronic obstructive pulmonary disease, unspecified: Secondary | ICD-10-CM | POA: Diagnosis not present

## 2022-12-17 DIAGNOSIS — K219 Gastro-esophageal reflux disease without esophagitis: Secondary | ICD-10-CM | POA: Diagnosis not present

## 2022-12-17 DIAGNOSIS — E119 Type 2 diabetes mellitus without complications: Secondary | ICD-10-CM | POA: Diagnosis not present

## 2022-12-18 DIAGNOSIS — J449 Chronic obstructive pulmonary disease, unspecified: Secondary | ICD-10-CM | POA: Diagnosis not present

## 2022-12-18 DIAGNOSIS — K219 Gastro-esophageal reflux disease without esophagitis: Secondary | ICD-10-CM | POA: Diagnosis not present

## 2022-12-18 DIAGNOSIS — E119 Type 2 diabetes mellitus without complications: Secondary | ICD-10-CM | POA: Diagnosis not present

## 2022-12-18 DIAGNOSIS — G9341 Metabolic encephalopathy: Secondary | ICD-10-CM | POA: Diagnosis not present

## 2022-12-19 DIAGNOSIS — E119 Type 2 diabetes mellitus without complications: Secondary | ICD-10-CM | POA: Diagnosis not present

## 2022-12-19 DIAGNOSIS — K219 Gastro-esophageal reflux disease without esophagitis: Secondary | ICD-10-CM | POA: Diagnosis not present

## 2022-12-19 DIAGNOSIS — J449 Chronic obstructive pulmonary disease, unspecified: Secondary | ICD-10-CM | POA: Diagnosis not present

## 2022-12-20 DIAGNOSIS — E119 Type 2 diabetes mellitus without complications: Secondary | ICD-10-CM | POA: Diagnosis not present

## 2022-12-20 DIAGNOSIS — K219 Gastro-esophageal reflux disease without esophagitis: Secondary | ICD-10-CM | POA: Diagnosis not present

## 2022-12-20 DIAGNOSIS — J449 Chronic obstructive pulmonary disease, unspecified: Secondary | ICD-10-CM | POA: Diagnosis not present

## 2022-12-22 DIAGNOSIS — E119 Type 2 diabetes mellitus without complications: Secondary | ICD-10-CM | POA: Diagnosis not present

## 2022-12-22 DIAGNOSIS — J449 Chronic obstructive pulmonary disease, unspecified: Secondary | ICD-10-CM | POA: Diagnosis not present

## 2022-12-22 DIAGNOSIS — K219 Gastro-esophageal reflux disease without esophagitis: Secondary | ICD-10-CM | POA: Diagnosis not present

## 2022-12-24 ENCOUNTER — Encounter: Payer: Self-pay | Admitting: Family

## 2022-12-24 ENCOUNTER — Ambulatory Visit (INDEPENDENT_AMBULATORY_CARE_PROVIDER_SITE_OTHER): Payer: 59 | Admitting: Family

## 2022-12-24 VITALS — BP 138/84 | HR 100 | Temp 96.6°F | Resp 20

## 2022-12-24 DIAGNOSIS — I10 Essential (primary) hypertension: Secondary | ICD-10-CM

## 2022-12-24 DIAGNOSIS — Z8673 Personal history of transient ischemic attack (TIA), and cerebral infarction without residual deficits: Secondary | ICD-10-CM | POA: Diagnosis not present

## 2022-12-24 DIAGNOSIS — Z90710 Acquired absence of both cervix and uterus: Secondary | ICD-10-CM | POA: Insufficient documentation

## 2022-12-24 DIAGNOSIS — Z1231 Encounter for screening mammogram for malignant neoplasm of breast: Secondary | ICD-10-CM | POA: Diagnosis not present

## 2022-12-24 DIAGNOSIS — E0590 Thyrotoxicosis, unspecified without thyrotoxic crisis or storm: Secondary | ICD-10-CM | POA: Diagnosis not present

## 2022-12-24 DIAGNOSIS — R2681 Unsteadiness on feet: Secondary | ICD-10-CM

## 2022-12-24 DIAGNOSIS — K219 Gastro-esophageal reflux disease without esophagitis: Secondary | ICD-10-CM

## 2022-12-24 DIAGNOSIS — M24542 Contracture, left hand: Secondary | ICD-10-CM

## 2022-12-24 DIAGNOSIS — E785 Hyperlipidemia, unspecified: Secondary | ICD-10-CM

## 2022-12-24 DIAGNOSIS — F332 Major depressive disorder, recurrent severe without psychotic features: Secondary | ICD-10-CM

## 2022-12-24 DIAGNOSIS — E1169 Type 2 diabetes mellitus with other specified complication: Secondary | ICD-10-CM

## 2022-12-24 MED ORDER — FREESTYLE LIBRE 14 DAY SENSOR MISC: 1.0000 | 12 refills | Status: DC | PRN

## 2022-12-24 MED ORDER — INSULIN LISPRO 100 UNIT/ML IJ SOLN: 4.0000 [IU] | Freq: Three times a day (TID) | INTRAMUSCULAR | 1 refills | Status: DC

## 2022-12-24 NOTE — Progress Notes (Unsigned)
Provider: Richarda Blade FNP-C  Lori [name], Lori Citrin, NP  Patient Care Team: Lori [name], Lori Citrin, NP as PCP - General (Family Medicine) Lori Pyo, MD as PCP - Cardiology (Cardiology) [facility]  Extended Emergency Contact Information Primary Emergency Contact: Lori [name],Lori [name] Mobile Phone: [phone] Relation: Spouse Preferred language: English Interpreter needed? No Secondary Emergency Contact: Lori [name],Lori [name] Home Phone: [phone] Mobile Phone: [phone] Relation: Daughter Mother: Lori [name] Phone: [phone] Mobile Phone: [phone]  Code Status:  Full Code  Goals of care: Advanced Directive information    12/24/2022   10:28 AM  Advanced Directives  Does Patient Have a Medical Advance Directive? No  Would patient like information on creating a medical advance directive? No - Patient declined     Chief Complaint  Patient presents with  . Follow-up    Follow up from rehab and discuss medicare annual wellness visit ,cervical cancer screening,colonoscopy,and discuss flu,covid and shingles.    HPI:  Pt is a 52 y.o. female seen today for an acute visit for follow up post discharge from SNF rehab on 12/22/2022 after hospitalization. No records from the Skilled Nursing Rehab.States was prescribed mouth wash for yeast infection on the tongue.   Type 2 DM - states CBG runs in the 120's -130's had x 1 episode 74 due skipping meals.  Due for colon cancer - states received specimen bottles in the mail yesterday.       Past Medical History:  Diagnosis Date  . CAD (coronary artery disease)   . Depression   . Diabetes mellitus without complication (HCC)   . History of CT scan   . History of mammogram   . History of MRI   . Hypertension   . Pheochromocytoma   . Stroke (HCC)   . Thyroid disease    Past Surgical History:  Procedure Laterality Date  . ABDOMINAL HYSTERECTOMY    . CESAREAN SECTION     3  . CORONARY ARTERY  BYPASS GRAFT    . Right adrenal gland removal for pheochromocytoma Right     Allergies  Allergen Reactions  . Asa [Aspirin] Anaphylaxis and Swelling    Throat closes  . Contrast Media [Iodinated Contrast Media] Anaphylaxis and Swelling  . Imitrex [Sumatriptan] Anaphylaxis  . Penicillins Swelling    Mouth and eye swelling  . Firvanq [Vancomycin] Other (See Comments)    Red Man's Syndrome  . Cipro [Ciprofloxacin Hcl] Nausea And Vomiting  . Ms Contin [Morphine] Swelling  . Pork-Derived Products Other (See Comments)    No known allergy but don't want to take it because of religious values  . Wound Dressing Adhesive Itching and Rash    Outpatient Encounter Medications as of 12/24/2022  Medication Sig  . albuterol (PROVENTIL) (2.5 MG/3ML) 0.083% nebulizer solution Take 3 mLs (2.5 mg total) by nebulization every 6 (six) hours as needed for wheezing or shortness of breath.  Marland Kitchen albuterol (VENTOLIN HFA) 108 (90 Base) MCG/ACT inhaler INHALE 2 PUFFS BY MOUTH EVERY 6 HOURS AS NEEDED FOR WHEEZING AND FOR SHORTNESS OF BREATH  . atorvastatin (LIPITOR) 80 MG tablet Take 1 tablet (80 mg total) by mouth daily. (Patient taking differently: Take 80 mg by mouth at bedtime.)  . baclofen (LIORESAL) 10 MG tablet TAKE 1 TABLET BY MOUTH 3 TIMES DAILY AT 9AM, 1PM AND 5PM FOR SPASTICITY  . bisacodyl (DULCOLAX) 5 MG EC tablet Take 1 tablet (5 mg total) by mouth daily as needed for moderate constipation.  . blood glucose meter kit and supplies 1  each by Other route as directed. Dispense based on patient and insurance preference. Use up to four times daily as directed. (FOR ICD-10 E10.9, E11.9).  Marland Kitchen clopidogrel (PLAVIX) 75 MG tablet Take 1 tablet (75 mg total) by mouth daily.  . Continuous Glucose Receiver (FREESTYLE LIBRE 2 READER) DEVI 1 Device by Does not apply route daily. E11.69  . Continuous Glucose Sensor (FREESTYLE LIBRE 14 DAY SENSOR) MISC 1 each by Does not apply route as needed. DX: E11.69  .  cyclobenzaprine (FLEXERIL) 5 MG tablet TAKE 1 TABLET BY MOUTH 3 TIMES DAILY AS NEEDED FOR MUSCLE SPASMS  . diclofenac Sodium (VOLTAREN) 1 % GEL Apply 2 g topically 4 (four) times daily.  Marland Kitchen escitalopram (LEXAPRO) 20 MG tablet Take 1 tablet (20 mg total) by mouth every morning.  . ezetimibe (ZETIA) 10 MG tablet Take 1 tablet (10 mg total) by mouth daily. (Patient taking differently: Take 10 mg by mouth at bedtime.)  . gabapentin (NEURONTIN) 100 MG capsule Take 100 mg by mouth 3 (three) times daily.  . insulin glargine (LANTUS SOLOSTAR) 100 UNIT/ML Solostar Pen Inject 22 Units into the skin daily.  . insulin lispro (HUMALOG) 100 UNIT/ML injection Inject 0.04 mLs (4 Units total) into the skin 3 (three) times daily with meals.  . insulin lispro (HUMALOG) 100 UNIT/ML injection Sliding scale AC, HS CBG 121 - 150: 2 units  CBG 151 - 200: 3 units  CBG 201 - 250: 5 units  CBG 251 - 300: 8 units  CBG 301 - 350: 11 units  CBG 351 - 400: 15 units  CBG > 400: call MD  . Insulin Pen Needle 32G X 4 MM MISC Use to inject insulin 4 times daily as directed.  Marland Kitchen LORazepam (ATIVAN) 0.5 MG tablet Take 1 tablet (0.5 mg total) by mouth at bedtime.  . methimazole (TAPAZOLE) 5 MG tablet Take 1 tablet (5 mg total) by mouth every morning.  . nystatin (MYCOSTATIN/NYSTOP) powder Apply 1 Application topically 2 (two) times daily as needed (irritation under breast/skin folds).  Marland Kitchen omeprazole (PRILOSEC) 20 MG capsule Take 1 capsule (20 mg total) by mouth daily.  . polyethylene glycol powder (GLYCOLAX/MIRALAX) 17 GM/SCOOP powder Take 17 g by mouth daily. Hold for loose stool (Patient taking differently: Take 17 g by mouth daily as needed (constipation).)  . ranolazine (RANEXA) 500 MG 12 hr tablet Take 1 tablet (500 mg total) by mouth 2 (two) times daily.  . rivaroxaban (XARELTO) 10 MG TABS tablet Take 1 tablet (10 mg total) by mouth daily.  Marland Kitchen senna-docusate (SENOKOT-S) 8.6-50 MG tablet Take 1 tablet by mouth daily. (Patient  taking differently: Take 1 tablet by mouth daily as needed (constipation).)  . tamsulosin (FLOMAX) 0.4 MG CAPS capsule Take 0.4 mg by mouth at bedtime.  . topiramate (TOPAMAX) 25 MG tablet Take 1 tablet (25 mg total) by mouth 2 (two) times daily.  Marland Kitchen zinc oxide (BALMEX) 11.3 % CREA cream Apply 1 Application topically 2 (two) times daily.  . budesonide (PULMICORT) 0.25 MG/2ML nebulizer solution Take 2 mLs (0.25 mg total) by nebulization 2 (two) times daily. (Patient not taking: Reported on 12/24/2022)   No facility-administered encounter medications on file as of 12/24/2022.    Review of Systems  Immunization History  Administered Date(s) Administered  . Influenza-Unspecified 11/04/2016  . PPD Test 03/19/2016  . Tdap 02/07/2014   Pertinent  Health Maintenance Due  Topic Date Due  . FOOT EXAM  Never done  . OPHTHALMOLOGY EXAM  Never done  .  Colonoscopy  Never done  . INFLUENZA VACCINE  09/05/2022  . MAMMOGRAM  Never done  . HEMOGLOBIN A1C  05/25/2023      09/10/2022   12:28 PM 09/30/2022    2:16 PM 10/28/2022    1:49 PM 11/01/2022    3:05 PM 12/24/2022   10:28 AM  Fall Risk  Falls in the past year? 0 0 1 0 0  Was there an injury with Fall? 0 0 1  0  Fall Risk Category Calculator 0 0 3  0  Patient at Risk for Falls Due to  No Fall Risks History of fall(s)    Fall risk Follow up  Falls evaluation completed Falls evaluation completed     Functional Status Survey:    Vitals:   12/24/22 1038  BP: 138/84  Pulse: 100  Resp: 20  Temp: (!) 96.6 F (35.9 C)  SpO2: 99%   There is no height or weight on file to calculate BMI. Physical Exam  Labs reviewed: Recent Labs    11/24/22 0820 11/25/22 0520 11/26/22 0508 11/27/22 0707 12/02/22 0530 12/05/22 0500  NA 135 139   < > 137 136 135  K 3.6 4.3   < > 3.9 4.0 3.8  CL 104 106   < > 105 105 105  CO2 22 22   < > 23 23 21*  GLUCOSE 240* 185*   < > 134* 196* 189*  BUN 8 14   < > 18 20 18   CREATININE 0.77 1.16*   < > 1.05*  0.94 0.93  CALCIUM 8.9 9.2   < > 8.9 8.9 8.7*  MG 2.0 2.1  --   --  1.7  --   PHOS 3.3 3.4  --   --  4.1  --    < > = values in this interval not displayed.   Recent Labs    11/24/22 0820 11/25/22 0520 12/02/22 0530 12/05/22 0500  AST 14*  --  11* 14*  ALT 14  --  13 18  ALKPHOS 74  --  55 55  BILITOT 0.7  --  0.3 0.3  PROT 7.3  --  6.3* 6.0*  ALBUMIN 3.4* 3.4* 3.0* 2.8*   Recent Labs    10/31/22 1315 11/04/22 0054 11/04/22 0101 11/22/22 0019 11/22/22 0920 11/27/22 0707 12/02/22 0530 12/05/22 0500  WBC 8.9 10.7*   < > 7.3   < > 8.4 8.5 8.6  NEUTROABS 6.4 6.2  --  5.4  --   --   --   --   HGB 13.4 10.7*   < > 12.6   < > 9.7* 9.7* 9.5*  HCT 41.1 33.5*   < > 38.6   < > 29.7* 30.1* 29.4*  MCV 79.5* 81.3   < > 81.6   < > 80.5 80.7 80.1  PLT 382 364   < > 386   < > 377 424* 426*   < > = values in this interval not displayed.   Lab Results  Component Value Date   TSH 1.306 11/25/2022   Lab Results  Component Value Date   HGBA1C 8.5 (H) 11/24/2022   Lab Results  Component Value Date   CHOL 132 10/07/2022   HDL 35 (L) 10/07/2022   LDLCALC 66 10/07/2022   TRIG 157 (H) 10/07/2022   CHOLHDL 3.8 10/07/2022    Significant Diagnostic Results in last 30 days:  No results found.  Assessment/Plan There are no diagnoses linked to this encounter.  Family/ staff Communication: Reviewed plan of care with patient  Labs/tests ordered: None   Next Appointment:   Caesar Bookman, NP

## 2022-12-25 ENCOUNTER — Other Ambulatory Visit: Payer: Self-pay

## 2022-12-25 ENCOUNTER — Inpatient Hospital Stay (HOSPITAL_COMMUNITY)
Admission: EM | Admit: 2022-12-25 | Discharge: 2022-12-28 | DRG: 641 | Disposition: A | Discharge status: Home Health | Payer: 59 | Attending: Family Medicine | Admitting: Family Medicine

## 2022-12-25 ENCOUNTER — Emergency Department (HOSPITAL_COMMUNITY): Payer: 59

## 2022-12-25 ENCOUNTER — Encounter (HOSPITAL_COMMUNITY): Payer: Self-pay

## 2022-12-25 DIAGNOSIS — Z825 Family history of asthma and other chronic lower respiratory diseases: Secondary | ICD-10-CM

## 2022-12-25 DIAGNOSIS — I959 Hypotension, unspecified: Secondary | ICD-10-CM | POA: Diagnosis not present

## 2022-12-25 DIAGNOSIS — K219 Gastro-esophageal reflux disease without esophagitis: Secondary | ICD-10-CM | POA: Diagnosis not present

## 2022-12-25 DIAGNOSIS — E669 Obesity, unspecified: Secondary | ICD-10-CM | POA: Diagnosis present

## 2022-12-25 DIAGNOSIS — I6930 Unspecified sequelae of cerebral infarction: Secondary | ICD-10-CM | POA: Diagnosis not present

## 2022-12-25 DIAGNOSIS — E860 Dehydration: Principal | ICD-10-CM | POA: Diagnosis present

## 2022-12-25 DIAGNOSIS — I2510 Atherosclerotic heart disease of native coronary artery without angina pectoris: Secondary | ICD-10-CM | POA: Diagnosis present

## 2022-12-25 DIAGNOSIS — Z91014 Allergy to mammalian meats: Secondary | ICD-10-CM

## 2022-12-25 DIAGNOSIS — Z886 Allergy status to analgesic agent status: Secondary | ICD-10-CM

## 2022-12-25 DIAGNOSIS — N179 Acute kidney failure, unspecified: Principal | ICD-10-CM | POA: Diagnosis present

## 2022-12-25 DIAGNOSIS — E109 Type 1 diabetes mellitus without complications: Secondary | ICD-10-CM | POA: Diagnosis present

## 2022-12-25 DIAGNOSIS — D509 Iron deficiency anemia, unspecified: Secondary | ICD-10-CM | POA: Diagnosis not present

## 2022-12-25 DIAGNOSIS — R1110 Vomiting, unspecified: Secondary | ICD-10-CM | POA: Diagnosis not present

## 2022-12-25 DIAGNOSIS — Z7401 Bed confinement status: Secondary | ICD-10-CM | POA: Diagnosis not present

## 2022-12-25 DIAGNOSIS — Z743 Need for continuous supervision: Secondary | ICD-10-CM | POA: Diagnosis not present

## 2022-12-25 DIAGNOSIS — Z794 Long term (current) use of insulin: Secondary | ICD-10-CM | POA: Diagnosis not present

## 2022-12-25 DIAGNOSIS — E1169 Type 2 diabetes mellitus with other specified complication: Secondary | ICD-10-CM

## 2022-12-25 DIAGNOSIS — E119 Type 2 diabetes mellitus without complications: Secondary | ICD-10-CM

## 2022-12-25 DIAGNOSIS — Z8679 Personal history of other diseases of the circulatory system: Secondary | ICD-10-CM | POA: Diagnosis not present

## 2022-12-25 DIAGNOSIS — E0590 Thyrotoxicosis, unspecified without thyrotoxic crisis or storm: Secondary | ICD-10-CM | POA: Diagnosis not present

## 2022-12-25 DIAGNOSIS — I69354 Hemiplegia and hemiparesis following cerebral infarction affecting left non-dominant side: Secondary | ICD-10-CM

## 2022-12-25 DIAGNOSIS — Z7902 Long term (current) use of antithrombotics/antiplatelets: Secondary | ICD-10-CM

## 2022-12-25 DIAGNOSIS — R079 Chest pain, unspecified: Secondary | ICD-10-CM | POA: Diagnosis not present

## 2022-12-25 DIAGNOSIS — Z86718 Personal history of other venous thrombosis and embolism: Secondary | ICD-10-CM | POA: Diagnosis not present

## 2022-12-25 DIAGNOSIS — Z951 Presence of aortocoronary bypass graft: Secondary | ICD-10-CM | POA: Diagnosis not present

## 2022-12-25 DIAGNOSIS — Z8249 Family history of ischemic heart disease and other diseases of the circulatory system: Secondary | ICD-10-CM | POA: Diagnosis not present

## 2022-12-25 DIAGNOSIS — R1310 Dysphagia, unspecified: Secondary | ICD-10-CM

## 2022-12-25 DIAGNOSIS — R1111 Vomiting without nausea: Secondary | ICD-10-CM | POA: Diagnosis not present

## 2022-12-25 DIAGNOSIS — Z79899 Other long term (current) drug therapy: Secondary | ICD-10-CM

## 2022-12-25 DIAGNOSIS — Z833 Family history of diabetes mellitus: Secondary | ICD-10-CM

## 2022-12-25 DIAGNOSIS — E1143 Type 2 diabetes mellitus with diabetic autonomic (poly)neuropathy: Secondary | ICD-10-CM | POA: Diagnosis not present

## 2022-12-25 DIAGNOSIS — N3000 Acute cystitis without hematuria: Secondary | ICD-10-CM | POA: Diagnosis not present

## 2022-12-25 DIAGNOSIS — Z90710 Acquired absence of both cervix and uterus: Secondary | ICD-10-CM

## 2022-12-25 DIAGNOSIS — R5383 Other fatigue: Secondary | ICD-10-CM | POA: Diagnosis not present

## 2022-12-25 DIAGNOSIS — I10 Essential (primary) hypertension: Secondary | ICD-10-CM | POA: Diagnosis present

## 2022-12-25 DIAGNOSIS — F411 Generalized anxiety disorder: Secondary | ICD-10-CM | POA: Diagnosis present

## 2022-12-25 DIAGNOSIS — Z832 Family history of diseases of the blood and blood-forming organs and certain disorders involving the immune mechanism: Secondary | ICD-10-CM

## 2022-12-25 DIAGNOSIS — Z6836 Body mass index (BMI) 36.0-36.9, adult: Secondary | ICD-10-CM

## 2022-12-25 DIAGNOSIS — Z7901 Long term (current) use of anticoagulants: Secondary | ICD-10-CM

## 2022-12-25 DIAGNOSIS — K3184 Gastroparesis: Secondary | ICD-10-CM | POA: Diagnosis not present

## 2022-12-25 DIAGNOSIS — I110 Hypertensive heart disease with heart failure: Secondary | ICD-10-CM | POA: Diagnosis not present

## 2022-12-25 DIAGNOSIS — I5032 Chronic diastolic (congestive) heart failure: Secondary | ICD-10-CM | POA: Diagnosis not present

## 2022-12-25 DIAGNOSIS — K30 Functional dyspepsia: Secondary | ICD-10-CM | POA: Diagnosis not present

## 2022-12-25 DIAGNOSIS — R112 Nausea with vomiting, unspecified: Secondary | ICD-10-CM | POA: Diagnosis present

## 2022-12-25 DIAGNOSIS — I69328 Other speech and language deficits following cerebral infarction: Secondary | ICD-10-CM | POA: Diagnosis not present

## 2022-12-25 DIAGNOSIS — F32A Depression, unspecified: Secondary | ICD-10-CM | POA: Diagnosis present

## 2022-12-25 DIAGNOSIS — D649 Anemia, unspecified: Secondary | ICD-10-CM | POA: Diagnosis present

## 2022-12-25 DIAGNOSIS — Z880 Allergy status to penicillin: Secondary | ICD-10-CM

## 2022-12-25 DIAGNOSIS — Z808 Family history of malignant neoplasm of other organs or systems: Secondary | ICD-10-CM

## 2022-12-25 DIAGNOSIS — Z91041 Radiographic dye allergy status: Secondary | ICD-10-CM

## 2022-12-25 DIAGNOSIS — R531 Weakness: Secondary | ICD-10-CM | POA: Diagnosis not present

## 2022-12-25 DIAGNOSIS — Z8719 Personal history of other diseases of the digestive system: Secondary | ICD-10-CM

## 2022-12-25 LAB — CBC WITH DIFFERENTIAL/PLATELET
Abs Immature Granulocytes: 0.03 10*3/uL (ref 0.00–0.07)
Absolute Lymphocytes: 2322 {cells}/uL (ref 850–3900)
Absolute Monocytes: 612 {cells}/uL (ref 200–950)
Basophils Absolute: 63 {cells}/uL (ref 0–200)
Basophils Absolute: 0.1 10*3/uL (ref 0.0–0.1)
Basophils Relative: 0.7 %
Basophils Relative: 1 %
Eosinophils Absolute: 108 {cells}/uL (ref 15–500)
Eosinophils Absolute: 0.1 10*3/uL (ref 0.0–0.5)
Eosinophils Relative: 1.2 %
Eosinophils Relative: 1 %
HCT: 38.9 % (ref 35.0–45.0)
HCT: 35.9 % — ABNORMAL LOW (ref 36.0–46.0)
Hemoglobin: 12.1 g/dL (ref 11.7–15.5)
Hemoglobin: 11.5 g/dL — ABNORMAL LOW (ref 12.0–15.0)
Immature Granulocytes: 0 %
Lymphocytes Relative: 24 %
Lymphs Abs: 2.9 10*3/uL (ref 0.7–4.0)
MCH: 25.6 pg — ABNORMAL LOW (ref 27.0–33.0)
MCH: 25.5 pg — ABNORMAL LOW (ref 26.0–34.0)
MCHC: 31.1 g/dL — ABNORMAL LOW (ref 32.0–36.0)
MCHC: 32 g/dL (ref 30.0–36.0)
MCV: 82.2 fL (ref 80.0–100.0)
MCV: 79.6 fL — ABNORMAL LOW (ref 80.0–100.0)
MPV: 9.3 fL (ref 7.5–12.5)
Monocytes Absolute: 1 10*3/uL (ref 0.1–1.0)
Monocytes Relative: 6.8 %
Monocytes Relative: 8 %
Neutro Abs: 5895 {cells}/uL (ref 1500–7800)
Neutro Abs: 8.2 10*3/uL — ABNORMAL HIGH (ref 1.7–7.7)
Neutrophils Relative %: 65.5 %
Neutrophils Relative %: 66 %
Platelets: 500 10*3/uL — ABNORMAL HIGH (ref 140–400)
Platelets: 478 10*3/uL — ABNORMAL HIGH (ref 150–400)
RBC: 4.73 10*6/uL (ref 3.80–5.10)
RBC: 4.51 MIL/uL (ref 3.87–5.11)
RDW: 15.1 % — ABNORMAL HIGH (ref 11.0–15.0)
RDW: 15.6 % — ABNORMAL HIGH (ref 11.5–15.5)
Total Lymphocyte: 25.8 %
WBC: 9 10*3/uL (ref 3.8–10.8)
WBC: 12.2 10*3/uL — ABNORMAL HIGH (ref 4.0–10.5)
nRBC: 0 % (ref 0.0–0.2)

## 2022-12-25 LAB — BASIC METABOLIC PANEL
BUN: 13 mg/dL (ref 7–25)
CO2: 22 mmol/L (ref 20–32)
Calcium: 9.3 mg/dL (ref 8.6–10.4)
Chloride: 102 mmol/L (ref 98–110)
Creat: 0.87 mg/dL (ref 0.50–1.03)
Glucose, Bld: 229 mg/dL — ABNORMAL HIGH (ref 65–139)
Potassium: 4 mmol/L (ref 3.5–5.3)
Sodium: 136 mmol/L (ref 135–146)

## 2022-12-25 LAB — COMPREHENSIVE METABOLIC PANEL
ALT: 13 U/L (ref 0–44)
AST: 18 U/L (ref 15–41)
Albumin: 3.7 g/dL (ref 3.5–5.0)
Alkaline Phosphatase: 63 U/L (ref 38–126)
Anion gap: 11 (ref 5–15)
BUN: 23 mg/dL — ABNORMAL HIGH (ref 6–20)
CO2: 22 mmol/L (ref 22–32)
Calcium: 9.2 mg/dL (ref 8.9–10.3)
Chloride: 104 mmol/L (ref 98–111)
Creatinine, Ser: 2.23 mg/dL — ABNORMAL HIGH (ref 0.44–1.00)
GFR, Estimated: 26 mL/min — ABNORMAL LOW (ref >60)
Glucose, Bld: 184 mg/dL — ABNORMAL HIGH (ref 70–99)
Potassium: 4.7 mmol/L (ref 3.5–5.1)
Sodium: 137 mmol/L (ref 135–145)
Total Bilirubin: 0.8 mg/dL (ref <1.2)
Total Protein: 7.7 g/dL (ref 6.5–8.1)

## 2022-12-25 LAB — ETHANOL: Alcohol, Ethyl (B): <10 mg/dL (ref <10)

## 2022-12-25 MED ORDER — ATORVASTATIN CALCIUM 40 MG PO TABS
80.0000 mg | ORAL_TABLET | Freq: Every day | ORAL | Status: DC
  Administered 2022-12-26 – 2022-12-28 (×2): 80 mg via ORAL
  Filled 2022-12-25 (×2): qty 2

## 2022-12-25 MED ORDER — ALBUTEROL SULFATE (2.5 MG/3ML) 0.083% IN NEBU: 2.5000 mg | INHALATION_SOLUTION | Freq: Four times a day (QID) | RESPIRATORY_TRACT | Status: DC | PRN

## 2022-12-25 MED ORDER — SODIUM CHLORIDE 0.9 % IV SOLN: INTRAVENOUS | Status: AC

## 2022-12-25 MED ORDER — ACETAMINOPHEN 650 MG RE SUPP: 650.0000 mg | Freq: Four times a day (QID) | RECTAL | Status: DC | PRN

## 2022-12-25 MED ORDER — RIVAROXABAN 10 MG PO TABS
10.0000 mg | ORAL_TABLET | Freq: Every day | ORAL | Status: DC
  Administered 2022-12-26 – 2022-12-28 (×3): 10 mg via ORAL
  Filled 2022-12-25 (×3): qty 1

## 2022-12-25 MED ORDER — LORAZEPAM 0.5 MG PO TABS
0.5000 mg | ORAL_TABLET | Freq: Every day | ORAL | Status: DC
  Administered 2022-12-26 – 2022-12-27 (×3): 0.5 mg via ORAL
  Filled 2022-12-25 (×3): qty 1

## 2022-12-25 MED ORDER — SODIUM CHLORIDE 0.9% FLUSH
3.0000 mL | Freq: Two times a day (BID) | INTRAVENOUS | Status: DC
  Administered 2022-12-26 (×2): 3 mL via INTRAVENOUS

## 2022-12-25 MED ORDER — SENNOSIDES-DOCUSATE SODIUM 8.6-50 MG PO TABS
1.0000 | ORAL_TABLET | Freq: Every day | ORAL | Status: DC
  Filled 2022-12-25 (×2): qty 1

## 2022-12-25 MED ORDER — INSULIN ASPART 100 UNIT/ML IJ SOLN
4.0000 [IU] | Freq: Three times a day (TID) | INTRAMUSCULAR | Status: DC
  Administered 2022-12-26 – 2022-12-28 (×5): 4 [IU] via SUBCUTANEOUS
  Filled 2022-12-25: qty 0.04

## 2022-12-25 MED ORDER — INSULIN GLARGINE-YFGN 100 UNIT/ML ~~LOC~~ SOLN
22.0000 [IU] | Freq: Every day | SUBCUTANEOUS | Status: DC
  Administered 2022-12-26 – 2022-12-28 (×3): 22 [IU] via SUBCUTANEOUS
  Filled 2022-12-25 (×3): qty 0.22

## 2022-12-25 MED ORDER — BISACODYL 5 MG PO TBEC: 5.0000 mg | DELAYED_RELEASE_TABLET | Freq: Every day | ORAL | Status: DC | PRN

## 2022-12-25 MED ORDER — SODIUM CHLORIDE 0.9 % IV BOLUS
1000.0000 mL | Freq: Once | INTRAVENOUS | Status: AC
  Administered 2022-12-25: 1000 mL via INTRAVENOUS

## 2022-12-25 MED ORDER — RANOLAZINE ER 500 MG PO TB12
500.0000 mg | ORAL_TABLET | Freq: Two times a day (BID) | ORAL | Status: DC
  Administered 2022-12-26 – 2022-12-28 (×5): 500 mg via ORAL
  Filled 2022-12-25 (×6): qty 1

## 2022-12-25 MED ORDER — SODIUM CHLORIDE 0.9 % IV SOLN
250.0000 mL | INTRAVENOUS | Status: AC | PRN
Start: 2022-12-25 — End: 2022-12-26

## 2022-12-25 MED ORDER — ESCITALOPRAM OXALATE 20 MG PO TABS
20.0000 mg | ORAL_TABLET | Freq: Every morning | ORAL | Status: DC
  Administered 2022-12-26 – 2022-12-28 (×3): 20 mg via ORAL
  Filled 2022-12-25 (×2): qty 1
  Filled 2022-12-25: qty 2

## 2022-12-25 MED ORDER — PANTOPRAZOLE SODIUM 40 MG PO TBEC
40.0000 mg | DELAYED_RELEASE_TABLET | Freq: Every day | ORAL | Status: DC
  Administered 2022-12-26 – 2022-12-28 (×3): 40 mg via ORAL
  Filled 2022-12-25 (×3): qty 1

## 2022-12-25 MED ORDER — SODIUM CHLORIDE 0.9% FLUSH
3.0000 mL | Freq: Two times a day (BID) | INTRAVENOUS | Status: DC
  Administered 2022-12-26: 3 mL via INTRAVENOUS

## 2022-12-25 MED ORDER — ONDANSETRON HCL 4 MG/2ML IJ SOLN
4.0000 mg | Freq: Once | INTRAMUSCULAR | Status: AC
  Administered 2022-12-25: 4 mg via INTRAVENOUS
  Filled 2022-12-25: qty 2

## 2022-12-25 MED ORDER — CLOPIDOGREL BISULFATE 75 MG PO TABS
75.0000 mg | ORAL_TABLET | Freq: Every day | ORAL | Status: DC
  Administered 2022-12-26 – 2022-12-28 (×3): 75 mg via ORAL
  Filled 2022-12-25 (×3): qty 1

## 2022-12-25 MED ORDER — POLYETHYLENE GLYCOL 3350 17 GM/SCOOP PO POWD
17.0000 g | Freq: Every day | ORAL | Status: DC
  Filled 2022-12-25: qty 255

## 2022-12-25 MED ORDER — ONDANSETRON HCL 4 MG/2ML IJ SOLN
4.0000 mg | Freq: Four times a day (QID) | INTRAMUSCULAR | Status: DC | PRN
  Administered 2022-12-27 (×2): 4 mg via INTRAVENOUS
  Filled 2022-12-25 (×2): qty 2

## 2022-12-25 MED ORDER — ONDANSETRON HCL 4 MG PO TABS: 4.0000 mg | ORAL_TABLET | Freq: Four times a day (QID) | ORAL | Status: DC | PRN

## 2022-12-25 MED ORDER — POLYETHYLENE GLYCOL 3350 17 G PO PACK
17.0000 g | PACK | Freq: Every day | ORAL | Status: DC
  Administered 2022-12-27 – 2022-12-28 (×2): 17 g via ORAL
  Filled 2022-12-25 (×3): qty 1

## 2022-12-25 MED ORDER — ACETAMINOPHEN 325 MG PO TABS
650.0000 mg | ORAL_TABLET | Freq: Four times a day (QID) | ORAL | Status: DC | PRN
  Administered 2022-12-26: 650 mg via ORAL
  Filled 2022-12-25: qty 2

## 2022-12-25 MED ORDER — BACLOFEN 10 MG PO TABS: 10.0000 mg | ORAL_TABLET | Freq: Four times a day (QID) | ORAL | Status: DC

## 2022-12-25 MED ORDER — SODIUM CHLORIDE 0.9% FLUSH: 3.0000 mL | INTRAVENOUS | Status: DC | PRN

## 2022-12-25 MED ORDER — METHIMAZOLE 10 MG PO TABS: 10.0000 mg | ORAL_TABLET | Freq: Every day | ORAL | Status: DC

## 2022-12-25 MED ORDER — EZETIMIBE 10 MG PO TABS
10.0000 mg | ORAL_TABLET | Freq: Every day | ORAL | Status: DC
  Administered 2022-12-26 – 2022-12-28 (×2): 10 mg via ORAL
  Filled 2022-12-25 (×2): qty 1

## 2022-12-25 NOTE — ED Provider Notes (Signed)
[facility] AT [facility] Provider Note  CSN: [id number] Arrival date & time: 12/25/22 1706  Chief Complaint(s) Emesis  HPI Lori [name] is a 52 y.o. female history of coronary artery disease, diabetes, CHF, prior stroke with left hemiparesis presenting to the emergency department with vomiting and weakness.  Patient's husband reports that she has been sleeping a lot, not wanting to get up during the day, not eating or drinking, and is also developed some vomiting today.  Patient denies any pain anywhere, reports she "feels fine" wants to go home.  Patient denies any chest pain, headaches, abdominal pain, dysuria.  No fevers or chills.   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 12/25/2022   History of DVT (deep vein thrombosis) 12/25/2022   S/P total hysterectomy 12/24/2022   Delirium 11/22/2022   Dehydration 11/22/2022   Acute encephalopathy 11/04/2022   Chronic anticoagulation 11/04/2022   Hypotension 11/04/2022   Acute metabolic encephalopathy 11/04/2022   Morbid obesity (HCC) 10/13/2022   Aspiration pneumonia (HCC) 10/13/2022   Chronic diastolic CHF (congestive heart failure) (HCC) 10/13/2022   Chronic anemia 10/13/2022   Acute pancreatitis 10/11/2022   Chest pain of uncertain etiology 10/06/2022   History of CAD (coronary artery disease) 10/06/2022   Generalized anxiety disorder 10/06/2022   Reactive airway disease 10/06/2022   UTI (urinary tract infection) 07/04/2022   Mild intermittent asthma, uncomplicated 07/04/2022   Lethargic 05/01/2022   Hyponatremia 04/30/2022   Hyperkalemia 04/30/2022   AKI  (acute kidney injury) (HCC) 04/30/2022   Wheelchair dependence 04/08/2022   Spasticity as late effect of cerebrovascular accident (CVA) 04/08/2022   Left hemiplegia (HCC) 04/08/2022   Dyspnea 11/02/2021   GERD (gastroesophageal reflux disease) 11/02/2021   Nausea and vomiting 08/25/2021   Acute bronchitis 08/24/2021   Hypothyroidism 07/10/2021   Functional urinary incontinence 05/17/2021   MDD (major depressive disorder), recurrent episode, severe (HCC) 04/09/2021   Diabetic gastroparesis (HCC) 03/16/2021   Migraines 03/16/2021   History of CVA with residual deficit    Debility 02/26/2021   DKA (diabetic ketoacidosis) (HCC) 02/19/2021   Weakness    Insulin dependent type 2 diabetes mellitus (HCC)    Aphasia    Spell of abnormal behavior    History of CVA with spastic paresis (cerebrovascular accident) 01/19/2021   Tachycardia 01/19/2021   CAD (coronary artery disease) 01/19/2021   Spastic hemiparesis of left 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 (HCC) 01/19/2021   Insulin dependent type 1 diabetes mellitus (HCC)    Hyperthyroidism    Essential hypertension    Pheochromocytoma    Depression    HLD (hyperlipidemia)    Home Medication(s) Prior to Admission medications   Medication Sig Start Date End Date Taking? Authorizing Provider  albuterol (PROVENTIL) (2.5 MG/3ML) 0.083% nebulizer solution Take 3 mLs (2.5 mg total) by nebulization every 6 (six) hours as needed for wheezing or shortness of breath. 05/21/22   Ngetich, Dinah C, NP  albuterol (VENTOLIN HFA) 108 (90 Base) MCG/ACT inhaler INHALE 2 PUFFS BY MOUTH EVERY 6 HOURS AS NEEDED FOR  WHEEZING AND FOR SHORTNESS OF BREATH 10/17/22   Ngetich, Dinah C, NP  atorvastatin (LIPITOR) 80 MG tablet Take 1 tablet (80 mg total) by mouth daily. Patient taking differently: Take 80 mg by mouth at bedtime. 09/02/22   Ngetich, Dinah C, NP  baclofen (LIORESAL) 10 MG tablet TAKE 1 TABLET BY MOUTH 3 TIMES DAILY AT 9AM, 1PM AND 5PM FOR  SPASTICITY 11/18/22   Lovorn, Aundra Millet, MD  bisacodyl (DULCOLAX) 5 MG EC tablet Take 1 tablet (5 mg total) by mouth daily as needed for moderate constipation. 12/06/22   Jerald Kief, MD  blood glucose meter kit and supplies 1 each by Other route as directed. Dispense based on patient and insurance preference. Use up to four times daily as directed. (FOR ICD-10 E10.9, E11.9). 06/24/22   Ngetich, Dinah C, NP  budesonide (PULMICORT) 0.25 MG/2ML nebulizer solution Take 2 mLs (0.25 mg total) by nebulization 2 (two) times daily. Patient not taking: Reported on 12/24/2022 05/21/22   Ngetich, Dinah C, NP  clopidogrel (PLAVIX) 75 MG tablet Take 1 tablet (75 mg total) by mouth daily. 09/02/22   Ngetich, Donalee Citrin, NP  Continuous Glucose Receiver (FREESTYLE LIBRE 2 READER) DEVI 1 Device by Does not apply route daily. E11.69 11/01/22   Ngetich, Dinah C, NP  Continuous Glucose Sensor (FREESTYLE LIBRE 14 DAY SENSOR) MISC 1 each by Does not apply route as needed. DX: E11.69 12/24/22   Ngetich, Dinah C, NP  cyclobenzaprine (FLEXERIL) 5 MG tablet TAKE 1 TABLET BY MOUTH 3 TIMES DAILY AS NEEDED FOR MUSCLE SPASMS 11/18/22   Lovorn, Aundra Millet, MD  diclofenac Sodium (VOLTAREN) 1 % GEL Apply 2 g topically 4 (four) times daily.    [provider]  escitalopram (LEXAPRO) 20 MG tablet Take 1 tablet (20 mg total) by mouth every morning. 09/02/22   Ngetich, Dinah C, NP  ezetimibe (ZETIA) 10 MG tablet Take 1 tablet (10 mg total) by mouth daily. Patient taking differently: Take 10 mg by mouth at bedtime. 09/02/22   Ngetich, Dinah C, NP  gabapentin (NEURONTIN) 100 MG capsule Take 100 mg by mouth 3 (three) times daily. 11/14/22   [provider]  insulin glargine (LANTUS SOLOSTAR) 100 UNIT/ML Solostar Pen Inject 22 Units into the skin daily. 12/06/22   Jerald Kief, MD  insulin lispro (HUMALOG) 100 UNIT/ML injection Sliding scale AC, HS CBG 121 - 150: 2 units  CBG 151 - 200: 3 units  CBG 201 - 250: 5 units  CBG 251 -  300: 8 units  CBG 301 - 350: 11 units  CBG 351 - 400: 15 units  CBG > 400: call MD 12/06/22   Jerald Kief, MD  insulin lispro (HUMALOG) 100 UNIT/ML injection Inject 0.04 mLs (4 Units total) into the skin 3 (three) times daily with meals. 12/24/22   Ngetich, Dinah C, NP  Insulin Pen Needle 32G X 4 MM MISC Use to inject insulin 4 times daily as directed. 05/21/22   Ngetich, Dinah C, NP  LORazepam (ATIVAN) 0.5 MG tablet Take 1 tablet (0.5 mg total) by mouth at bedtime. 12/06/22   Jerald Kief, MD  methimazole (TAPAZOLE) 5 MG tablet Take 1 tablet (5 mg total) by mouth every morning. 09/16/22   Ngetich, Dinah C, NP  nystatin (MYCOSTATIN/NYSTOP) powder Apply 1 Application topically 2 (two) times daily as needed (irritation under breast/skin folds).    [provider]  omeprazole (PRILOSEC) 20 MG capsule Take 1 capsule (20 mg total) by mouth daily. 09/02/22  Ngetich, Dinah C, NP  polyethylene glycol powder (GLYCOLAX/MIRALAX) 17 GM/SCOOP powder Take 17 g by mouth daily. Hold for loose stool Patient taking differently: Take 17 g by mouth daily as needed (constipation). 06/27/22   Ngetich, Dinah C, NP  ranolazine (RANEXA) 500 MG 12 hr tablet Take 1 tablet (500 mg total) by mouth 2 (two) times daily. 10/09/22 01/07/23  [place], Alvira Philips, DO  rivaroxaban (XARELTO) 10 MG TABS tablet Take 1 tablet (10 mg total) by mouth daily. 09/02/22   Ngetich, Dinah C, NP  senna-docusate (SENOKOT-S) 8.6-50 MG tablet Take 1 tablet by mouth daily. Patient taking differently: Take 1 tablet by mouth daily as needed (constipation). 10/31/22   Elayne Snare K, DO  tamsulosin (FLOMAX) 0.4 MG CAPS capsule Take 0.4 mg by mouth at bedtime. 11/04/22   [provider]  topiramate (TOPAMAX) 25 MG tablet Take 1 tablet (25 mg total) by mouth 2 (two) times daily. 07/10/22   Sherryll Burger, Pratik D, DO  zinc oxide (BALMEX) 11.3 % CREA cream Apply 1 Application topically 2 (two) times daily.    [provider]                                                                                                                                     Past Surgical History Past Surgical History:  Procedure Laterality Date   ABDOMINAL HYSTERECTOMY     CESAREAN SECTION     3   CORONARY ARTERY BYPASS GRAFT     Right adrenal gland removal for pheochromocytoma Right    Family History Family History  Problem Relation Age of Onset   Hypertension Mother    Diabetes Mother    Atrial fibrillation Mother    Hypertension Father    Heart attack Father    Dementia Father    Diabetes Brother    Brain cancer Brother    Asthma Daughter    Sickle cell anemia Son    Atrial fibrillation Maternal Uncle     Social History Social History   Tobacco Use   Smoking status: Never   Smokeless tobacco: Never  Vaping Use   Vaping status: Never Used  Substance Use Topics   Alcohol use: Not Currently   Drug use: Never   Allergies Asa [aspirin], Contrast media [iodinated contrast media], Imitrex [sumatriptan], Penicillins, Firvanq [vancomycin], Cipro [ciprofloxacin hcl], Ms contin [morphine], Pork-derived products, and Wound dressing adhesive  Review of Systems Review of Systems  All other systems reviewed and are negative.   Physical Exam Vital Signs  I have reviewed the triage vital signs BP 118/82   Pulse 82   Temp 97.9 F (36.6 C)   Resp 18   Wt 91 kg   SpO2 99%   BMI 36.69 kg/m  Physical Exam Vitals and nursing note reviewed.  Constitutional:      General: She is not in acute distress.    Appearance: She is well-developed.  HENT:     Head: Normocephalic and atraumatic.     Mouth/Throat:     Mouth: Mucous membranes are dry.  Eyes:     Pupils: Pupils are equal, round, and reactive to light.  Cardiovascular:     Rate and Rhythm: Normal rate and regular rhythm.     Heart sounds: No murmur heard. Pulmonary:     Effort: Pulmonary effort is normal. No respiratory distress.     Breath sounds: Normal breath sounds.   Abdominal:     General: Abdomen is flat.     Palpations: Abdomen is soft.     Tenderness: There is no abdominal tenderness.  Musculoskeletal:        General: No tenderness.     Right lower leg: No edema.     Left lower leg: No edema.  Skin:    General: Skin is warm and dry.  Neurological:     Mental Status: She is alert.     Comments: At baseline, oriented x 4, 2-3 out of 5 strength in the left upper and lower extremity with chronic left-sided facial droop, cranial nerves otherwise intact,  Psychiatric:        Mood and Affect: Mood normal.        Behavior: Behavior normal.     ED Results and Treatments Labs (all labs ordered are listed, but only abnormal results are displayed) Labs Reviewed  CBC WITH DIFFERENTIAL/PLATELET - Abnormal; Notable for the following components:      Result Value   WBC 12.2 (*)    Hemoglobin 11.5 (*)    HCT 35.9 (*)    MCV 79.6 (*)    MCH 25.5 (*)    RDW 15.6 (*)    Platelets 478 (*)    Neutro Abs 8.2 (*)    All other components within normal limits  COMPREHENSIVE METABOLIC PANEL - Abnormal; Notable for the following components:   Glucose, Bld 184 (*)    BUN 23 (*)    Creatinine, Ser 2.23 (*)    GFR, Estimated 26 (*)    All other components within normal limits  ETHANOL  URINALYSIS, W/ REFLEX TO CULTURE (INFECTION SUSPECTED)  RAPID URINE DRUG SCREEN, HOSP PERFORMED                                                                                                                          Radiology DG Chest Port 1 View  Result Date: 12/25/2022 CLINICAL DATA:  Chest pain, vomiting, lethargic EXAM: PORTABLE CHEST 1 VIEW COMPARISON:  11/21/2022 FINDINGS: Single frontal view of the chest demonstrates postsurgical changes from median sternotomy. The cardiac silhouette is stable. No acute airspace disease, effusion, or pneumothorax. No acute bony abnormalities. IMPRESSION: 1. No acute intrathoracic process. Electronically Signed   By: Sharlet Salina  M.D.   On: 12/25/2022 21:34    Pertinent labs & imaging results that were available during my care of the patient were reviewed by me and considered in my  medical decision making (see MDM for details).  Medications Ordered in ED Medications  sodium chloride 0.9 % bolus 1,000 mL (0 mLs Intravenous Stopped 12/25/22 2241)  ondansetron (ZOFRAN) injection 4 mg (4 mg Intravenous Given 12/25/22 2013)                                                                                                                                     Procedures Procedures  (including critical care time)  Medical Decision Making / ED Course   MDM:  52 year old female presenting to the emergency department with weakness.  Patient is overall chronically ill-appearing but in no acute distress.  Vital signs are stable.  Patient appears very dehydrated.  Laboratory test with creatinine of 2.23 from baseline of normal.  Patient will need to be admitted for hydration.  Unclear cause of her weakness and dehydration, patient was previously admitted last month with a very similar episode, had extensive workup including pan CT scans which did not reveal any acute process and she was ultimately thought to have polypharmacy.  Today's presentation does not seem very different.  She has no focal findings on exam other than her chronic neurologic deficits and signs of dehydration so doubt further imaging would be beneficial.  Will check urinalysis, ask for catheterization urine from nursing although patient does deny any dysuria.  Discussed with hospitalist Dr. Janalyn Shy who will admit the patient.      Additional history obtained: -Additional history obtained from family -External records from outside source obtained and reviewed including: Chart review including previous notes, labs, imaging, consultation notes including prior admission for very similar symptoms    Lab Tests: -I ordered, reviewed, and interpreted labs.   The  pertinent results include:   Labs Reviewed  CBC WITH DIFFERENTIAL/PLATELET - Abnormal; Notable for the following components:      Result Value   WBC 12.2 (*)    Hemoglobin 11.5 (*)    HCT 35.9 (*)    MCV 79.6 (*)    MCH 25.5 (*)    RDW 15.6 (*)    Platelets 478 (*)    Neutro Abs 8.2 (*)    All other components within normal limits  COMPREHENSIVE METABOLIC PANEL - Abnormal; Notable for the following components:   Glucose, Bld 184 (*)    BUN 23 (*)    Creatinine, Ser 2.23 (*)    GFR, Estimated 26 (*)    All other components within normal limits  ETHANOL  URINALYSIS, W/ REFLEX TO CULTURE (INFECTION SUSPECTED)  RAPID URINE DRUG SCREEN, HOSP PERFORMED    Notable for dehydration      Imaging Studies ordered: I ordered imaging studies including CXR On my interpretation imaging demonstrates no acute process I independently visualized and interpreted imaging. I agree with the radiologist interpretation   Medicines ordered and prescription drug management: Meds ordered this encounter  Medications   sodium chloride 0.9 % bolus 1,000  mL   ondansetron (ZOFRAN) injection 4 mg    -I have reviewed the patients home medicines and have made adjustments as needed   Consultations Obtained: I requested consultation with the hospitalist,  and discussed lab and imaging findings as well as pertinent plan - they recommend: admission   Cardiac Monitoring: The patient was maintained on a cardiac monitor.  I personally viewed and interpreted the cardiac monitored which showed an underlying rhythm of: NSR  Social Determinants of Health:  Diagnosis or treatment significantly limited by social determinants of health: obesity   Reevaluation: After the interventions noted above, I reevaluated the patient and found that their symptoms have improved  Co morbidities that complicate the patient evaluation  Past Medical History:  Diagnosis Date   CAD (coronary artery disease)    Depression     Diabetes mellitus without complication (HCC)    History of CT scan    History of mammogram    History of MRI    Hypertension    Pheochromocytoma    Stroke [facility])    Thyroid disease       Dispostion: Disposition decision including need for hospitalization was considered, and patient admitted to the hospital.    Final Clinical Impression(s) / ED Diagnoses Final diagnoses:  Acute kidney injury (HCC)     This chart was dictated using voice recognition software.  Despite best efforts to proofread,  errors can occur which can change the documentation meaning.    Lonell Grandchild, MD 12/25/22 [phone]

## 2022-12-25 NOTE — H&P (Incomplete)
History and Physical    [name] ZOX:[id number] DOB: 1970/06/11 DOA: 12/25/2022  PCP: Caesar Bookman, NP   Patient coming from: Home   Chief Complaint:  Chief Complaint  Patient presents with   Emesis   ED TRIAGE note:BIBA from home c/o vomiting and lethargic x2 days.  Hx stroke and speech delay and left sided deficit from stroke at baseline.  Caregiver reports patient is at baseline neuro.  Cbg-[id number] with ems  HPI:  Lori [name] is a 52 y.o. 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 During my evaluation at the bedside patient reported that she has nausea but denies any vomiting.  She also generalized weakness.  Patient denies any chest pain, shortness of breath, palpitation or abdominal pain, and diarrhea.  Patient is poor historian and mostly answered with shaking head left and right or up and down.  Patient has recent hospitalization 9/29 to 9/3 for acute metabolic encephalopathy.  Another admission from 10/18 to 11/1 for acute metabolic encephalopathy and agitation due to dehydration in the setting of nausea and vomiting.  During discharge dose of methimazole has been increased to 10 mg daily and patient's tachycardia has been resolved.  She was set up to see outpatient endocrinology for follow-up.   ED Course:  At presentation to ED patient is hemodynamically stable except soft blood pressure 98/78.  UA cloudy appearance, moderate leukocyte but stable and few bacteria. UDS positive with benzodiazepine. CBC showing leukocytosis 12.2, stable H&H  11.5 and 35, low MCV 79, elevated platelet 478 which is around baseline. CMP unremarkable except elevated BUN 23, elevated creatinine 2.23 and GFR 26.  Otherwise unremarkable.  Blood alcohol level less than 10. Chest x-ray no acute intrathoracic process.  In the ED patient has been treated and 1 L of NS bolus and Zofran 4 mg.  Physician reported that patient seems very dehydrated and having generalized weakness/lethargy.  However patient is alert oriented.  Denies any abdominal pain.  Physical exam no sign of acute abdomen.  Hospitalist has been consulted for further evaluation management of dehydration and AKI.  Review of Systems:  Review of Systems  Constitutional:  Negative for chills, fever and malaise/fatigue.  Respiratory:  Negative for sputum production.   Cardiovascular:  Negative for chest pain.  Gastrointestinal:  Negative for abdominal pain, diarrhea, heartburn, nausea and vomiting.  Genitourinary:  Negative for dysuria, flank pain, frequency and urgency.  Musculoskeletal:  Negative for back pain and myalgias.  Neurological:  Negative for headaches.    Past Medical History:  Diagnosis Date   CAD (coronary artery disease)    Depression    Diabetes mellitus without complication (HCC)    History of CT scan    History of mammogram    History of MRI    Hypertension    Pheochromocytoma    Stroke [facility])    Thyroid disease     Past Surgical History:  Procedure Laterality Date   ABDOMINAL HYSTERECTOMY     CESAREAN SECTION     3   CORONARY ARTERY BYPASS GRAFT     Right adrenal gland removal for pheochromocytoma Right      reports that she has never smoked. She has never  used smokeless tobacco. She reports that she does not currently use alcohol. She reports that she does not use drugs.  Allergies  Allergen Reactions   Asa [Aspirin] Anaphylaxis and Swelling    Throat closes   Contrast Media [Iodinated Contrast Media] Anaphylaxis and Swelling   Imitrex [Sumatriptan]  Anaphylaxis   Penicillins Swelling    Mouth and eye swelling   Firvanq [Vancomycin] Other (See Comments)    Red Man's Syndrome   Cipro [Ciprofloxacin Hcl] Nausea And Vomiting   Ms Contin [Morphine] Swelling   Pork-Derived Products Other (See Comments)    No known allergy but don't want to take it because of religious values   Wound Dressing Adhesive Itching and Rash    Family History  Problem Relation Age of Onset   Hypertension Mother    Diabetes Mother    Atrial fibrillation Mother    Hypertension Father    Heart attack Father    Dementia Father    Diabetes Brother    Brain cancer Brother    Asthma Daughter    Sickle cell anemia Son    Atrial fibrillation Maternal Uncle     Prior to Admission medications   Medication Sig Start Date End Date Taking? Authorizing Provider  albuterol (PROVENTIL) (2.5 MG/3ML) 0.083% nebulizer solution Take 3 mLs (2.5 mg total) by nebulization every 6 (six) hours as needed for wheezing or shortness of breath. 05/21/22   Ngetich, Dinah C, NP  albuterol (VENTOLIN HFA) 108 (90 Base) MCG/ACT inhaler INHALE 2 PUFFS BY MOUTH EVERY 6 HOURS AS NEEDED FOR WHEEZING AND FOR SHORTNESS OF BREATH 10/17/22   Ngetich, Dinah C, NP  atorvastatin (LIPITOR) 80 MG tablet Take 1 tablet (80 mg total) by mouth daily. Patient taking differently: Take 80 mg by mouth at bedtime. 09/02/22   Ngetich, Dinah C, NP  baclofen (LIORESAL) 10 MG tablet TAKE 1 TABLET BY MOUTH 3 TIMES DAILY AT 9AM, 1PM AND 5PM FOR SPASTICITY 11/18/22   Lovorn, Aundra Millet, MD  bisacodyl (DULCOLAX) 5 MG EC tablet Take 1 tablet (5 mg total) by mouth daily as needed for moderate constipation. 12/06/22   Jerald Kief, MD  blood glucose meter kit and supplies 1 each by Other route as directed. Dispense based on patient and insurance preference. Use up to four times daily as directed. (FOR ICD-10 E10.9, E11.9). 06/24/22   Ngetich, Dinah C, NP  budesonide (PULMICORT) 0.25 MG/2ML nebulizer solution Take 2 mLs (0.25 mg  total) by nebulization 2 (two) times daily. Patient not taking: Reported on 12/24/2022 05/21/22   Ngetich, Dinah C, NP  clopidogrel (PLAVIX) 75 MG tablet Take 1 tablet (75 mg total) by mouth daily. 09/02/22   Ngetich, Donalee Citrin, NP  Continuous Glucose Receiver (FREESTYLE LIBRE 2 READER) DEVI 1 Device by Does not apply route daily. E11.69 11/01/22   Ngetich, Dinah C, NP  Continuous Glucose Sensor (FREESTYLE LIBRE 14 DAY SENSOR) MISC 1 each by Does not apply route as needed. DX: E11.69 12/24/22   Ngetich, Dinah C, NP  cyclobenzaprine (FLEXERIL) 5 MG tablet TAKE 1 TABLET BY MOUTH 3 TIMES DAILY AS NEEDED FOR MUSCLE SPASMS 11/18/22   Lovorn, Aundra Millet, MD  diclofenac Sodium (VOLTAREN) 1 % GEL Apply 2 g topically 4 (four) times daily.    [provider]  escitalopram (LEXAPRO) 20 MG tablet Take 1 tablet (20 mg total) by mouth every morning. 09/02/22   Ngetich, Dinah C, NP  ezetimibe (ZETIA) 10 MG tablet Take 1 tablet (10 mg total) by mouth daily.  Patient taking differently: Take 10 mg by mouth at bedtime. 09/02/22   Ngetich, Dinah C, NP  gabapentin (NEURONTIN) 100 MG capsule Take 100 mg by mouth 3 (three) times daily. 11/14/22   [provider]  insulin glargine (LANTUS SOLOSTAR) 100 UNIT/ML Solostar Pen Inject 22 Units into the skin daily. 12/06/22   Jerald Kief, MD  insulin lispro (HUMALOG) 100 UNIT/ML injection Sliding scale AC, HS CBG 121 - 150: 2 units  CBG 151 - 200: 3 units  CBG 201 - 250: 5 units  CBG 251 - 300: 8 units  CBG 301 - 350: 11 units  CBG 351 - 400: 15 units  CBG > 400: call MD 12/06/22   Jerald Kief, MD  insulin lispro (HUMALOG) 100 UNIT/ML injection Inject 0.04 mLs (4 Units total) into the skin 3 (three) times daily with meals. 12/24/22   Ngetich, Dinah C, NP  Insulin Pen Needle 32G X 4 MM MISC Use to inject insulin 4 times daily as directed. 05/21/22   Ngetich, Dinah C, NP  LORazepam (ATIVAN) 0.5 MG tablet Take 1 tablet (0.5 mg total) by mouth at bedtime. 12/06/22    Jerald Kief, MD  methimazole (TAPAZOLE) 5 MG tablet Take 1 tablet (5 mg total) by mouth every morning. 09/16/22   Ngetich, Dinah C, NP  nystatin (MYCOSTATIN/NYSTOP) powder Apply 1 Application topically 2 (two) times daily as needed (irritation under breast/skin folds).    [provider]  omeprazole (PRILOSEC) 20 MG capsule Take 1 capsule (20 mg total) by mouth daily. 09/02/22   Ngetich, Dinah C, NP  polyethylene glycol powder (GLYCOLAX/MIRALAX) 17 GM/SCOOP powder Take 17 g by mouth daily. Hold for loose stool Patient taking differently: Take 17 g by mouth daily as needed (constipation). 06/27/22   Ngetich, Dinah C, NP  ranolazine (RANEXA) 500 MG 12 hr tablet Take 1 tablet (500 mg total) by mouth 2 (two) times daily. 10/09/22 01/07/23  [place], Alvira Philips, DO  rivaroxaban (XARELTO) 10 MG TABS tablet Take 1 tablet (10 mg total) by mouth daily. 09/02/22   Ngetich, Dinah C, NP  senna-docusate (SENOKOT-S) 8.6-50 MG tablet Take 1 tablet by mouth daily. Patient taking differently: Take 1 tablet by mouth daily as needed (constipation). 10/31/22   Elayne Snare K, DO  tamsulosin (FLOMAX) 0.4 MG CAPS capsule Take 0.4 mg by mouth at bedtime. 11/04/22   [provider]  topiramate (TOPAMAX) 25 MG tablet Take 1 tablet (25 mg total) by mouth 2 (two) times daily. 07/10/22   Sherryll Burger, Pratik D, DO  zinc oxide (BALMEX) 11.3 % CREA cream Apply 1 Application topically 2 (two) times daily.    [provider]     Physical Exam: Vitals:   12/26/22 0334 12/26/22 0400 12/26/22 0415 12/26/22 0430  BP: 104/77 112/77    Pulse: 86 80 78 79  Resp: 18     Temp: 98 F (36.7 C)     TempSrc: Oral     SpO2: 100% 96% 100% 100%  Weight:        Physical Exam HENT:     Mouth/Throat:     Mouth: Mucous membranes are dry.  Eyes:     Conjunctiva/sclera: Conjunctivae normal.  Cardiovascular:     Rate and Rhythm: Normal rate and regular rhythm.     Pulses: Normal pulses.     Heart sounds: Normal heart  sounds.  Pulmonary:     Effort: Pulmonary effort is normal.     Breath sounds: Normal breath sounds.  Abdominal:     General: Bowel sounds are normal.     Tenderness: There is no abdominal tenderness.  Musculoskeletal:     Cervical back: Neck supple.     Right lower leg: No edema.  Skin:    General: Skin is dry.     Capillary Refill: Capillary refill takes less than 2 seconds.  Neurological:     Mental Status: She is alert.     Comments: Patient is alert but not completely oriented.  Psychiatric:     Comments: Unable to assess.      Labs on Admission: I have personally reviewed following labs and imaging studies  CBC: Recent Labs  Lab 12/24/22 1139 12/25/22 1942  WBC 9.0 12.2*  NEUTROABS 5,895 8.2*  HGB 12.1 11.5*  HCT 38.9 35.9*  MCV 82.2 79.6*  PLT 500* 478*   Basic Metabolic Panel: Recent Labs  Lab 12/24/22 1139 12/25/22 1942  NA 136 137  K 4.0 4.7  CL 102 104  CO2 22 22  GLUCOSE 229* 184*  BUN 13 23*  CREATININE 0.87 2.23*  CALCIUM 9.3 9.2   GFR: Estimated Creatinine Clearance: 31.7 mL/min (A) (by C-G formula based on SCr of 2.23 mg/dL (H)). Liver Function Tests: Recent Labs  Lab 12/25/22 1942  AST 18  ALT 13  ALKPHOS 63  BILITOT 0.8  PROT 7.7  ALBUMIN 3.7   No results for input(s): "LIPASE", "AMYLASE" in the last 168 hours. No results for input(s): "AMMONIA" in the last 168 hours. Coagulation Profile: No results for input(s): "INR", "PROTIME" in the last 168 hours. Cardiac Enzymes: No results for input(s): "CKTOTAL", "CKMB", "CKMBINDEX", "TROPONINI", "TROPONINIHS" in the last 168 hours. BNP (last 3 results) Recent Labs    04/30/22 1245 05/22/22 0229 09/06/22 2242  BNP 26.3 49.6 19.5   HbA1C: No results for input(s): "HGBA1C" in the last 72 hours. CBG: No results for input(s): "GLUCAP" in the last 168 hours. Lipid Profile: No results for input(s): "CHOL", "HDL", "LDLCALC", "TRIG", "CHOLHDL", "LDLDIRECT" in the last 72  hours. Thyroid Function Tests: No results for input(s): "TSH", "T4TOTAL", "FREET4", "T3FREE", "THYROIDAB" in the last 72 hours. Anemia Panel: No results for input(s): "VITAMINB12", "FOLATE", "FERRITIN", "TIBC", "IRON", "RETICCTPCT" in the last 72 hours. Urine analysis:    Component Value Date/Time   COLORURINE AMBER (A) 12/26/2022 0052   APPEARANCEUR CLOUDY (A) 12/26/2022 0052   LABSPEC 1.015 12/26/2022 0052   PHURINE 6.0 12/26/2022 0052   GLUCOSEU NEGATIVE 12/26/2022 0052   HGBUR NEGATIVE 12/26/2022 0052   BILIRUBINUR NEGATIVE 12/26/2022 0052   KETONESUR NEGATIVE 12/26/2022 0052   PROTEINUR NEGATIVE 12/26/2022 0052   NITRITE NEGATIVE 12/26/2022 0052   LEUKOCYTESUR MODERATE (A) 12/26/2022 0052    Radiological Exams on Admission: I have personally reviewed images DG Chest Port 1 View  Result Date: 12/25/2022 CLINICAL DATA:  Chest pain, vomiting, lethargic EXAM: PORTABLE CHEST 1 VIEW COMPARISON:  11/21/2022 FINDINGS: Single frontal view of the chest demonstrates postsurgical changes from median sternotomy. The cardiac silhouette is stable. No acute airspace disease, effusion, or pneumothorax. No acute bony abnormalities. IMPRESSION: 1. No acute intrathoracic process. Electronically Signed   By: Sharlet Salina M.D.   On: 12/25/2022 21:34    EKG: My personal interpretation of EKG shows: Sinus rhythm heart rate 89.  Left atrial enlargement.    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 - At presentation to ED patient is hemodynamically stable except blood pressure is borderline soft. - CMP showing creatinine 2.23 and GFR 27.  Baseline normal renal function.  Per chart review patient had an episode of AKI while admitted 4 weeks ago. -UA amber-colored, cloudy appearance, leukocyte esterase positive and few bacteria.  Also hyaline cast present which collecting dehydration. - CBC showing mild leukocytosis 12.2.  Patient is afebrile.  Chest x-ray normal. - Concern for prerenal acute kidney injury in the setting of nausea vomiting which causing poor oral intake as well as low blood pressure. -Checking urine creatinine and urine sodium -In the ED patient received 1 L of NS bolus.  Plan to continue NS 100 cc/h for 1 day.   Acute cystitis -UA showed evidence of UTI.  Patient also has mild leukocytosis 12.2.  Even though patient cannot explain if she has UTI symptoms as given patient has AKI and leukocytosis starting treating empirically with IV ceftriaxone and will follow-up with urine culture result.  Nausea and vomiting -At bedside patient reported nausea denies any vomiting.  Patient denies any abdominal pain.  Physical exam unremarkable for any acute abdomen. - CMP showed normal hepatic panel. -Unclear etiology of nausea vomiting at this moment. -Per chart review patient was admitted 2 weeks ago regarding similar complaint of intractable nausea and vomiting.  CT chest abdomen pelvis were unremarkable at that time.  However patient was evaluated during that time for dysphagia with speech therapist and started on dysphagia diet.  Given patient does not have any acute abdominal sign deferring CT abdomen pelvis at this moment. -Continue Zofran as needed.  Hyperthyroidism -Continue methimazole 10 mg daily.  Essential hypertension Chronic diastolic heart  failure preserved EF 60 to 65% - Per chart review not on any blood pressure regimen currently at home.  Blood pressure is borderline soft.  Continue to monitor blood pressure.   CVA with residual left-sided spastic paresis - Continue Plavix 75 mg daily, Lipitor, Zetia -Comfort the management spastic paresis baclofen 3 times daily.  Holding gabapentin in the setting of AKI and hypotension.   History of CAD - Continue Plavix, Ranexa and statin.  Chronic anemia Microcytic anemia -Stable H&H 11.5 and 35.  Low MCV 79. - Per chart review and patient was admitted 11/1 found to have positive gastric, occult secondary from Mallory-Weiss tear related to nausea and vomiting.  Was treated with IV Protonix.  Patient's H&H remained stable and on discharge patient being started on Plavix and Xarelto. -Given patient hemoglobin is stable more than above baseline plan to continue Plavix and Xarelto.  Will continue to monitor CBC.  History of DVT 2023 on Xarelto -Continue Xarelto 10 mg daily.  DM type 2 insulin-dependent -A1c 8.5 in 11/24/2022.  Continue dysphagia 3 diabetic diet. - Continue Semglee 22 units, continue to check POC blood glucose 3 times daily with meal and low sliding scale insulin as needed.  History of dysphagia - Patient being evaluated by speech during last admission recommended dysphagia  3 diet and thin liquid.  Continue dysphagia level 3 diet.  Generalized anxiety disorder -Continue Lexapro 20 mg daily.  GERD - Continue Protonix  DVT prophylaxis:  Xarelto and SCD. Code Status:  Full Code Diet: Diabetic-dysphagia 3 diet Family Communication:   There is no family member at bedside now. Disposition Plan: Pending improvement of renal function and nausea. Consults: None at this time. Admission status:   Inpatient, Med-Surg  Severity of Illness: The appropriate patient status for this patient is INPATIENT. Inpatient status is judged to be reasonable and necessary in order to  provide the required intensity of service to ensure the patient's safety. The patient's presenting symptoms, physical exam findings, and initial radiographic and laboratory data in the context of their chronic comorbidities is felt to place them at high risk for further clinical deterioration. Furthermore, it is not anticipated that the patient will be medically stable for discharge from the hospital within 2 midnights of admission.   * I certify that at the point of admission it is my clinical judgment that the patient will require inpatient hospital care spanning beyond 2 midnights from the point of admission due to high intensity of service, high risk for further deterioration and high frequency of surveillance required.Marland Kitchen    Tereasa Coop, MD [facility]  How to contact the [facility] Attending or Consulting provider 7A - 7P or covering provider during after hours 7P -7A, for this patient.  Check the care team in [facility] and look for a) attending/consulting [facility] provider listed and b) the [facility] team listed Log into [email or web address] and use [facility] universal password to access. If you do not have the password, please contact the hospital operator. Locate the [facility] provider you are looking for under [facility] and page to a number that you can be directly reached. If you still have difficulty reaching the provider, please page the [facility] (Director on Call) for the Hospitalists listed on amion for assistance.  12/26/2022, 6:47 AM

## 2022-12-25 NOTE — ED Triage Notes (Addendum)
BIBA from home c/o vomiting and lethargic x2 days.  Hx stroke and speech delay and left sided deficit from stroke at baseline.  Caregiver reports patient is at baseline neuro.  Cbg-215 with ems

## 2022-12-26 ENCOUNTER — Other Ambulatory Visit: Payer: Self-pay

## 2022-12-26 DIAGNOSIS — N3000 Acute cystitis without hematuria: Secondary | ICD-10-CM

## 2022-12-26 DIAGNOSIS — N179 Acute kidney failure, unspecified: Secondary | ICD-10-CM | POA: Diagnosis not present

## 2022-12-26 HISTORY — DX: Acute cystitis without hematuria: N30.00

## 2022-12-26 LAB — URINALYSIS, W/ REFLEX TO CULTURE (INFECTION SUSPECTED)
Bilirubin Urine: NEGATIVE
Glucose, UA: NEGATIVE mg/dL
Ketones, ur: NEGATIVE mg/dL
Nitrite: NEGATIVE
Protein, ur: NEGATIVE mg/dL
Specific Gravity, Urine: 1.016 (ref 1.005–1.030)
pH: 7 (ref 5.0–8.0)

## 2022-12-26 LAB — GLUCOSE, CAPILLARY
Glucose-Capillary: 81 mg/dL (ref 70–99)
Glucose-Capillary: 83 mg/dL (ref 70–99)

## 2022-12-26 LAB — URINALYSIS, ROUTINE W REFLEX MICROSCOPIC
Bilirubin Urine: NEGATIVE
Glucose, UA: NEGATIVE mg/dL
Hgb urine dipstick: NEGATIVE
Ketones, ur: NEGATIVE mg/dL
Nitrite: NEGATIVE
Protein, ur: NEGATIVE mg/dL
Specific Gravity, Urine: 1.015 (ref 1.005–1.030)
pH: 6 (ref 5.0–8.0)

## 2022-12-26 LAB — RAPID URINE DRUG SCREEN, HOSP PERFORMED
Amphetamines: NOT DETECTED
Barbiturates: NOT DETECTED
Benzodiazepines: POSITIVE — AB
Cocaine: NOT DETECTED
Opiates: NOT DETECTED
Tetrahydrocannabinol: NOT DETECTED

## 2022-12-26 LAB — CBG MONITORING, ED
Glucose-Capillary: 126 mg/dL — ABNORMAL HIGH (ref 70–99)
Glucose-Capillary: 183 mg/dL — ABNORMAL HIGH (ref 70–99)

## 2022-12-26 LAB — SODIUM, URINE, RANDOM: Sodium, Ur: 41 mmol/L

## 2022-12-26 MED ORDER — INSULIN ASPART 100 UNIT/ML IJ SOLN
0.0000 [IU] | Freq: Three times a day (TID) | INTRAMUSCULAR | Status: DC
  Administered 2022-12-26: 1 [IU] via SUBCUTANEOUS
  Administered 2022-12-28: 2 [IU] via SUBCUTANEOUS
  Filled 2022-12-26: qty 0.06

## 2022-12-26 MED ORDER — BACLOFEN 10 MG PO TABS
10.0000 mg | ORAL_TABLET | Freq: Three times a day (TID) | ORAL | Status: DC
  Administered 2022-12-26 (×2): 10 mg via ORAL
  Filled 2022-12-26 (×2): qty 1

## 2022-12-26 MED ORDER — CEFTRIAXONE SODIUM 1 G IJ SOLR
1.0000 g | INTRAMUSCULAR | Status: DC
Start: 2022-12-26 — End: 2022-12-27
  Administered 2022-12-26 – 2022-12-27 (×2): 1 g via INTRAMUSCULAR
  Filled 2022-12-26 (×2): qty 10

## 2022-12-26 MED ORDER — METHIMAZOLE 5 MG PO TABS
5.0000 mg | ORAL_TABLET | Freq: Every day | ORAL | Status: DC
  Administered 2022-12-27 – 2022-12-28 (×2): 5 mg via ORAL
  Filled 2022-12-26 (×2): qty 1

## 2022-12-26 MED ORDER — CHLORHEXIDINE GLUCONATE CLOTH 2 % EX PADS
6.0000 | MEDICATED_PAD | Freq: Every day | CUTANEOUS | Status: DC
  Administered 2022-12-27 – 2022-12-28 (×2): 6 via TOPICAL

## 2022-12-26 MED ORDER — NYSTATIN 100000 UNIT/ML MT SUSP
5.0000 mL | Freq: Four times a day (QID) | OROMUCOSAL | Status: DC
  Administered 2022-12-26 – 2022-12-28 (×6): 500000 [IU] via ORAL
  Filled 2022-12-26 (×6): qty 5

## 2022-12-26 MED ORDER — SODIUM CHLORIDE 0.9% FLUSH
10.0000 mL | Freq: Two times a day (BID) | INTRAVENOUS | Status: DC
  Administered 2022-12-27: 10 mL

## 2022-12-26 MED ORDER — SODIUM CHLORIDE 0.9% FLUSH: 10.0000 mL | INTRAVENOUS | Status: DC | PRN

## 2022-12-26 NOTE — Progress Notes (Signed)
Peripherally Inserted Central Catheter Placement  The IV Nurse has discussed with the patient and/or persons authorized to consent for the patient, the purpose of this procedure and the potential benefits and risks involved with this procedure.  The benefits include less needle sticks, lab draws from the catheter, and the patient may be discharged home with the catheter. Risks include, but not limited to, infection, bleeding, blood clot (thrombus formation), and puncture of an artery; nerve damage and irregular heartbeat and possibility to perform a PICC exchange if needed/ordered by physician.  Alternatives to this procedure were also discussed.  Bard Power PICC patient education guide, fact sheet on infection prevention and patient information card has been provided to patient /or left at bedside.    PICC Placement Documentation  PICC Single Lumen 12/26/22 Right Brachial 36 cm 1 cm (Active)  Indication for Insertion or Continuance of Line Limited venous access - need for IV therapy >5 days (PICC only) 12/26/22 2257  Exposed Catheter (cm) 1 cm 12/26/22 2257  Site Assessment Clean, Dry, Intact 12/26/22 2257  Line Status Flushed;Saline locked;Blood return noted 12/26/22 2257  Dressing Type Transparent;Securing device 12/26/22 2257  Dressing Status Antimicrobial disc in place 12/26/22 2257  Line Care Connections checked and tightened 12/26/22 2257  Line Adjustment (NICU/IV Team Only) No 12/26/22 2257  Dressing Intervention New dressing;Adhesive placed at insertion site (IV team only) 12/26/22 2257  Dressing Change Due 01/02/23 12/26/22 2257       Lori [name]  Genevia Bouldin 12/26/2022, 10:58 PM

## 2022-12-26 NOTE — ED Notes (Signed)
Pt has called out to complain about her IV multiple times.  No swelling or pain noted to the site.  Site is not warm to touch. Pt was reassured that the IV was patent.

## 2022-12-26 NOTE — Progress Notes (Signed)
PROGRESS NOTE    Lori [name]  WUJ:[id number] DOB: 04-18-1970 DOA: 12/25/2022 PCP: Caesar Bookman, NP   Brief Narrative:  HPI:  Lori [name] is a 52 y.o. 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 During my evaluation at the bedside patient reported that she has nausea but denies any vomiting.  She also generalized weakness.  Patient denies any chest pain, shortness of breath, palpitation or abdominal pain, and diarrhea.  Patient is poor historian and mostly answered with shaking head left and right or up and down.   Patient has recent hospitalization 9/29 to 9/3 for acute metabolic encephalopathy.  Another admission from 10/18 to 11/1 for acute metabolic encephalopathy and agitation due to dehydration in the setting of nausea and vomiting.  During discharge dose of methimazole has been increased to 10 mg daily and patient's tachycardia has been resolved.  She was set up to see outpatient endocrinology for follow-up.     ED Course:  At presentation to ED patient is hemodynamically stable except soft blood pressure 98/78.  UA cloudy appearance, moderate leukocyte but stable and few bacteria. UDS positive with benzodiazepine. CBC showing leukocytosis 12.2, stable H&H 11.5 and 35, low MCV 79, elevated platelet 478 which is around baseline. CMP unremarkable except elevated BUN 23, elevated creatinine 2.23 and GFR 26.  Otherwise unremarkable.  Blood alcohol level less than 10. Chest x-ray no acute intrathoracic process.   In the ED patient has been treated and 1 L  of NS bolus and Zofran 4 mg.   Physician reported that patient seems very dehydrated and having generalized weakness/lethargy.  However patient is alert oriented.  Denies any abdominal pain.  Physical exam no sign of acute abdomen.  Hospitalist has been consulted for further evaluation management of dehydration and AKI.  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  Baseline normal renal function.  Continue IV fluids.  BMP pending for this morning.  Recurrent hospitalization with nausea and vomiting.  Has diabetes but has never been tested for gastroparesis.  Will continue symptomatic treatment for nausea vomiting and check for gastric emptying study in the morning.   Acute cystitis/UTI UA raising concern for UTI.  Continue Rocephin and follow culture.   Hyperthyroidism -Continue methimazole 10 mg daily.   Essential hypertension Chronic diastolic heart failure preserved EF 60 to 65% - Per chart review not on any blood pressure regimen currently at home.  Blood pressure soft in the ED but improving.  Monitor.    CVA with residual left-sided spastic paresis - Continue Plavix 75 mg daily, Lipitor, Zetia  Continue muscle relaxant.  Holding gabapentin in the setting of AKI and hypotension.   History of CAD - Continue Plavix, Ranexa and statin.   Chronic Microcytic anemia/ History of  DVT 2023 on Xarelto -Stable H&H 11.5 and 35.  Low MCV 79. - Per chart review and patient was admitted 11/1 found to have positive gastric, occult secondary from Mallory-Weiss tear related to nausea and vomiting.  Was treated with IV Protonix.  Patient's H&H remained stable and on discharge patient being started on Plavix and Xarelto. -Given patient hemoglobin is stable more than above baseline plan to continue Plavix and Xarelto.  Will continue to monitor CBC.   DM type 2 insulin-dependent -A1c 8.5 in 11/24/2022.  Continue dysphagia 3 diabetic diet. - Continue Semglee 22 units, continue to check POC blood glucose 3 times daily with meal and low sliding scale insulin as needed.   History of dysphagia - Patient being evaluated by speech during last admission recommended dysphagia 3 diet and thin liquid.  Continue dysphagia level 3 diet.   Generalized anxiety disorder -Continue Lexapro 20 mg daily.   GERD - Continue Protonix  DVT prophylaxis: rivaroxaban (XARELTO) tablet 10 mg Start: 12/26/22 1200 SCDs Start: 12/25/22 2324   Code Status: Full Code  Family Communication:  None present at bedside.  Plan of care discussed with patient in length and he/she verbalized understanding and agreed with it.  Status is: Inpatient Remains inpatient appropriate because: Gastric emptying study in the morning.   Estimated body mass index is 36.69 kg/m as calculated from the following:   Height as of 11/22/22: 5\' 2"  (1.575 m).   Weight as of this encounter: 91 kg.    Nutritional Assessment: Body mass index is 36.69 kg/m.Marland Kitchen Seen by dietician.  I agree with the assessment and plan as outlined below: Nutrition Status:        . Skin Assessment: I have examined the patient's skin and I agree with the wound assessment as performed by the wound care RN as outlined below:    Consultants:  None  Procedures:  None  Antimicrobials:  Anti-infectives (From admission, onward)    Start      Dose/Rate Route Frequency Ordered Stop   12/26/22 0515  cefTRIAXone (ROCEPHIN) injection 1 g        1 g Intramuscular Every 24 hours 12/26/22 0512 01/02/23 0514         Subjective: Patient seen and examined.  She states that her symptoms have improved, she was actually eating her breakfast, regular diet and was feeling well.  Objective: Vitals:   12/26/22 0415 12/26/22 0430 12/26/22 0700 12/26/22 1000  BP:   116/78 132/85  Pulse: 78 79 88 88  Resp:   16 16  Temp:   98.2 F (36.8 C) 98.2 F (36.8 C)  TempSrc:    Oral  SpO2: 100% 100% 100% 99%  Weight:        Intake/Output Summary (Last 24 hours) at 12/26/2022 1257 Last data filed at 12/25/2022 2241 Gross per 24 hour  Intake 1000 ml  Output --  Net 1000 ml   Filed Weights   12/25/22 1716  Weight: 91 kg    Examination:  General exam: Appears calm and comfortable  Respiratory system: Clear to auscultation. Respiratory effort normal. Cardiovascular system: S1 & S2 heard, RRR. No JVD, murmurs, rubs, gallops or clicks. No pedal edema. Gastrointestinal system: Abdomen is nondistended, soft and nontender. No organomegaly or masses felt. Normal bowel sounds heard. Central nervous system: Alert  and oriented.  4/5 power in left upper and lower extremity due to previous stroke, left facial droop and dysarthria as well. Extremities: Symmetric 5 x 5 power. Skin: No rashes, lesions or ulcers  Data Reviewed: I have personally reviewed following labs and imaging studies  CBC: Recent Labs  Lab 12/24/22 1139 12/25/22 1942  WBC 9.0 12.2*  NEUTROABS 5,895 8.2*  HGB 12.1 11.5*  HCT 38.9 35.9*  MCV 82.2 79.6*  PLT 500* 478*   Basic Metabolic Panel: Recent Labs  Lab 12/24/22 1139 12/25/22 1942  NA 136 137  K 4.0 4.7  CL 102 104  CO2 22 22  GLUCOSE 229* 184*  BUN 13 23*  CREATININE 0.87 2.23*  CALCIUM 9.3 9.2   GFR: Estimated Creatinine Clearance: 31.7 mL/min (A) (by C-G formula based on SCr of 2.23 mg/dL  (H)). Liver Function Tests: Recent Labs  Lab 12/25/22 1942  AST 18  ALT 13  ALKPHOS 63  BILITOT 0.8  PROT 7.7  ALBUMIN 3.7   No results for input(s): "LIPASE", "AMYLASE" in the last 168 hours. No results for input(s): "AMMONIA" in the last 168 hours. Coagulation Profile: No results for input(s): "INR", "PROTIME" in the last 168 hours. Cardiac Enzymes: No results for input(s): "CKTOTAL", "CKMB", "CKMBINDEX", "TROPONINI" in the last 168 hours. BNP (last 3 results) No results for input(s): "PROBNP" in the last 8760 hours. HbA1C: No results for input(s): "HGBA1C" in the last 72 hours. CBG: Recent Labs  Lab 12/26/22 0819 12/26/22 1155  GLUCAP 126* 183*   Lipid Profile: No results for input(s): "CHOL", "HDL", "LDLCALC", "TRIG", "CHOLHDL", "LDLDIRECT" in the last 72 hours. Thyroid Function Tests: No results for input(s): "TSH", "T4TOTAL", "FREET4", "T3FREE", "THYROIDAB" in the last 72 hours. Anemia Panel: No results for input(s): "VITAMINB12", "FOLATE", "FERRITIN", "TIBC", "IRON", "RETICCTPCT" in the last 72 hours. Sepsis Labs: No results for input(s): "PROCALCITON", "LATICACIDVEN" in the last 168 hours.  No results found for this or any previous visit (from the past 240 hour(s)).   Radiology Studies: DG Chest Port 1 View  Result Date: 12/25/2022 CLINICAL DATA:  Chest pain, vomiting, lethargic EXAM: PORTABLE CHEST 1 VIEW COMPARISON:  11/21/2022 FINDINGS: Single frontal view of the chest demonstrates postsurgical changes from median sternotomy. The cardiac silhouette is stable. No acute airspace disease, effusion, or pneumothorax. No acute bony abnormalities. IMPRESSION: 1. No acute intrathoracic process. Electronically Signed   By: Sharlet Salina M.D.   On: 12/25/2022 21:34    Scheduled Meds:  atorvastatin  80 mg Oral Daily   baclofen  10 mg Oral QID   cefTRIAXone (ROCEPHIN) IM  1 g Intramuscular Q24H   clopidogrel  75 mg Oral Daily   escitalopram  20 mg Oral q morning    ezetimibe  10 mg Oral Daily   insulin aspart  0-6 Units Subcutaneous TID WC   insulin aspart  4 Units Subcutaneous TID WC   insulin glargine-yfgn  22 Units Subcutaneous Daily   LORazepam  0.5 mg Oral QHS   methimazole  10 mg Oral Daily   pantoprazole  40 mg Oral Daily   polyethylene glycol  17 g Oral Daily   ranolazine  500 mg Oral BID   rivaroxaban  10 mg Oral Daily   senna-docusate  1 tablet Oral Daily   sodium chloride flush  3 mL Intravenous Q12H   sodium chloride flush  3 mL Intravenous Q12H   Continuous Infusions:  sodium chloride     sodium chloride 100 mL/hr at 12/26/22 0440  LOS: 1 day   Hughie Closs, MD [facility]  12/26/2022, 12:57 PM   *Please note that this is a verbal dictation therefore any spelling or grammatical errors are due to the "[employer]" system interpretation.  Please page via Amion and do not message via secure chat for urgent patient care matters. Secure chat can be used for non urgent patient care matters.  How to contact the [facility] Attending or Consulting provider 7A - 7P or covering provider during after hours 7P -7A, for this patient?  Check the care team in [facility] and look for a) attending/consulting [facility] provider listed and b) the [facility] team listed. Page or secure chat 7A-7P. Log into [email or web address] and use [place]'s universal password to access. If you do not have the password, please contact the hospital operator. Locate the [facility] provider you are looking for under [facility] and page to a number that you can be directly reached. If you still have difficulty reaching the provider, please page the [facility] (Director on Call) for the Hospitalists listed on amion for assistance.

## 2022-12-26 NOTE — ED Notes (Signed)
ED TO INPATIENT HANDOFF REPORT  Name/Age/Gender Lori [name] 52 y.o. female  Code Status    Code Status Orders  (From admission, onward)           Start     Ordered   12/25/22 2324  Full code  Continuous       Question:  By:  Answer:  Consent: discussion documented in EHR   12/25/22 2324           Code Status History     Date Active Date Inactive Code Status Order ID Comments User Context   11/22/2022 0522 12/06/2022 2333 Full Code [id number]  Angie Fava, DO ED   11/04/2022 0450 11/07/2022 1759 Full Code [id number]  Hillary Bow, DO ED   10/11/2022 2228 10/17/2022 0751 Full Code [id number]  Darlin Drop, DO ED   10/06/2022 2004 10/09/2022 1708 Full Code [id number]  Tereasa Coop, MD ED   07/04/2022 2325 07/10/2022 1618 Full Code [id number]  Briscoe Deutscher, MD ED   04/30/2022 1614 05/03/2022 1908 Full Code [id number]  Synetta Fail, MD ED   11/02/2021 1128 11/08/2021 0643 Full Code [id number]  Teddy Spike, DO ED   08/24/2021 2019 08/30/2021 1815 Full Code [id number]  Hillary Bow, DO ED   07/10/2021 1522 07/18/2021 1953 Full Code [id number]  Joseph Art, DO ED   04/08/2021 1845 04/09/2021 2309 Full Code [id number]  Henderly, Britni A, PA-C ED   02/26/2021 1411 03/16/2021 1843 Full Code [id number]  Jerene Pitch Inpatient   02/19/2021 1516 02/26/2021 1407 Full Code [id number]  Bobette Mo, MD ED   02/07/2021 1544 02/16/2021 0241 Full Code [id number]  Teddy Spike, DO ED   01/19/2021 1218 02/02/2021 1701 Full Code [id number]  Lorretta Harp, MD ED       Home/SNF/Other Home  Chief Complaint AKI (acute kidney injury) (HCC) [N17.9]  Level of Care/Admitting Diagnosis ED Disposition     ED Disposition  Admit   Condition  --   Comment  Hospital Area: [facility] [[id number]]  Level of Care: Telemetry [5]  Admit to tele based on following criteria: Other see comments  Comments: Monitor for tachycardia, bradycardia or arrhythmia.  May admit  patient to [facility] or [facility] if equivalent level of care is available:: No  Covid Evaluation: Asymptomatic - no recent exposure (last 10 days) testing not required  Diagnosis: AKI (acute kidney injury) [facility]) [[id number]]  Admitting Physician: Tereasa Coop [[id number]]  Attending Physician: Tereasa Coop [[id number]]  Certification:: I certify this patient will need inpatient services for at least 2 midnights          Medical History Past Medical History:  Diagnosis Date   CAD (coronary artery disease)    Depression    Diabetes mellitus without complication (HCC)    History of CT scan    History of mammogram    History of MRI    Hypertension    Pheochromocytoma    Stroke [facility])    Thyroid disease     Allergies Allergies  Allergen Reactions   Asa [Aspirin] Anaphylaxis and Swelling    Throat closes   Contrast Media [Iodinated Contrast Media] Anaphylaxis and Swelling   Imitrex [Sumatriptan] Anaphylaxis   Penicillins Swelling    Mouth and eye swelling   Firvanq [Vancomycin] Other (See Comments)    Red Man's Syndrome   Cipro [Ciprofloxacin Hcl] Nausea And Vomiting   Ms Contin [Morphine] Swelling   Pork-Derived Products Other (  See Comments)    No known allergy but don't want to take it because of religious values   Wound Dressing Adhesive Itching and Rash    IV Location/Drains/Wounds Patient Lines/Drains/Airways Status     Active Line/Drains/Airways     Name Placement date Placement time Site Days   Peripheral IV 12/25/22 22 G Right Wrist 12/25/22  1946  Wrist  1   Peripheral IV 12/26/22 20 G Anterior;Proximal;Right Forearm 12/26/22  1400  Forearm  less than 1   Wound / Incision (Open or Dehisced) 11/06/22 Irritant Dermatitis (Moisture Associated Skin Damage) Groin Anterior;Right;Left  11/06/22  1017  Groin  50            Labs/Imaging Results for orders placed or performed during the hospital encounter of 12/25/22 (from the past 48 hour(s))  Urinalysis, w/  Reflex to Culture (Infection Suspected) -Urine, Clean Catch     Status: Abnormal   Collection Time: 12/25/22  5:13 PM  Result Value Ref Range   Specimen Source URINE, CLEAN CATCH    Color, Urine AMBER (A) YELLOW    Comment: BIOCHEMICALS MAY BE AFFECTED BY COLOR   APPearance CLOUDY (A) CLEAR   Specific Gravity, Urine 1.016 1.005 - 1.030   pH 7.0 5.0 - 8.0   Glucose, UA NEGATIVE NEGATIVE mg/dL   Hgb urine dipstick SMALL (A) NEGATIVE   Bilirubin Urine NEGATIVE NEGATIVE   Ketones, ur NEGATIVE NEGATIVE mg/dL   Protein, ur NEGATIVE NEGATIVE mg/dL   Nitrite NEGATIVE NEGATIVE   Leukocytes,Ua SMALL (A) NEGATIVE   RBC / HPF 0-5 0 - 5 RBC/hpf   WBC, UA 11-20 0 - 5 WBC/hpf    Comment:        Reflex urine culture not performed if WBC <=10, OR if Squamous epithelial cells >5. If Squamous epithelial cells >5 suggest recollection.    Bacteria, UA RARE (A) NONE SEEN   Squamous Epithelial / HPF 21-50 0 - 5 /HPF   Mucus PRESENT    Hyaline Casts, UA PRESENT     Comment: Performed at [facility], [address] [place], [place] [address]  CBC with Differential     Status: Abnormal   Collection Time: 12/25/22  7:42 PM  Result Value Ref Range   WBC 12.2 (H) 4.0 - 10.5 K/uL   RBC 4.51 3.87 - 5.11 MIL/uL   Hemoglobin 11.5 (L) 12.0 - 15.0 g/dL   HCT 91.4 (L) 78.2 - 95.6 %   MCV 79.6 (L) 80.0 - 100.0 fL   MCH 25.5 (L) 26.0 - 34.0 pg   MCHC 32.0 30.0 - 36.0 g/dL   RDW 21.3 (H) 08.6 - 57.8 %   Platelets 478 (H) 150 - 400 K/uL   nRBC 0.0 0.0 - 0.2 %   Neutrophils Relative % 66 %   Neutro Abs 8.2 (H) 1.7 - 7.7 K/uL   Lymphocytes Relative 24 %   Lymphs Abs 2.9 0.7 - 4.0 K/uL   Monocytes Relative 8 %   Monocytes Absolute 1.0 0.1 - 1.0 K/uL   Eosinophils Relative 1 %   Eosinophils Absolute 0.1 0.0 - 0.5 K/uL   Basophils Relative 1 %   Basophils Absolute 0.1 0.0 - 0.1 K/uL   Immature Granulocytes 0 %   Abs Immature Granulocytes 0.03 0.00 - 0.07 K/uL    Comment: Performed at  [facility], [address] [place], [place] [address]  Comprehensive metabolic panel     Status: Abnormal   Collection Time: 12/25/22  7:42 PM  Result Value Ref Range   Sodium 137 135 - 145 mmol/L   Potassium 4.7 3.5 - 5.1 mmol/L   Chloride 104 98 - 111 mmol/L   CO2 22 22 - 32 mmol/L   Glucose, Bld 184 (H) 70 - 99 mg/dL    Comment: Glucose reference range applies only to samples taken after fasting for at least 8 hours.   BUN 23 (H) 6 - 20 mg/dL   Creatinine, Ser 2.95 (H) 0.44 - 1.00 mg/dL   Calcium 9.2 8.9 - 62.1 mg/dL   Total Protein 7.7 6.5 - 8.1 g/dL   Albumin 3.7 3.5 - 5.0 g/dL   AST 18 15 - 41 U/L   ALT 13 0 - 44 U/L   Alkaline Phosphatase 63 38 - 126 U/L   Total Bilirubin 0.8 <1.2 mg/dL   GFR, Estimated 26 (L) >60 mL/min    Comment: (NOTE) Calculated using the CKD-EPI Creatinine Equation (2021)    Anion gap 11 5 - 15    Comment: Performed at [facility], [address] [place], [place] [address]  Ethanol     Status: None   Collection Time: 12/25/22  7:42 PM  Result Value Ref Range   Alcohol, Ethyl (B) <10 <10 mg/dL    Comment: (NOTE) Lowest detectable limit for serum alcohol is 10 mg/dL.  For medical purposes only. Performed at [facility], [address] [place], [place] [address]   Urine rapid drug screen (hosp performed)     Status: Abnormal   Collection Time: 12/26/22 12:52 AM  Result Value Ref Range   Opiates NONE DETECTED NONE DETECTED   Cocaine NONE DETECTED NONE DETECTED   Benzodiazepines POSITIVE (A) NONE DETECTED   Amphetamines NONE DETECTED NONE DETECTED   Tetrahydrocannabinol NONE DETECTED NONE DETECTED   Barbiturates NONE DETECTED NONE DETECTED    Comment: (NOTE) DRUG SCREEN FOR MEDICAL PURPOSES ONLY.  IF CONFIRMATION IS NEEDED FOR ANY PURPOSE, NOTIFY LAB WITHIN 5 DAYS.  LOWEST DETECTABLE LIMITS FOR URINE DRUG SCREEN Drug Class                     Cutoff (ng/mL) Amphetamine and  metabolites    1000 Barbiturate and metabolites    200 Benzodiazepine                 200 Opiates and metabolites        300 Cocaine and metabolites        300 THC                            50 Performed at [facility], [address], [place], [place] [address]   Urinalysis, Routine w reflex microscopic -Urine, Clean Catch     Status: Abnormal   Collection Time: 12/26/22 12:52 AM  Result Value Ref Range   Color, Urine AMBER (A) YELLOW    Comment: BIOCHEMICALS MAY BE AFFECTED BY COLOR   APPearance CLOUDY (A) CLEAR   Specific Gravity, Urine 1.015 1.005 - 1.030   pH 6.0 5.0 - 8.0   Glucose, UA NEGATIVE NEGATIVE mg/dL   Hgb urine dipstick NEGATIVE NEGATIVE   Bilirubin Urine NEGATIVE NEGATIVE   Ketones, ur NEGATIVE NEGATIVE mg/dL   Protein, ur NEGATIVE NEGATIVE mg/dL   Nitrite NEGATIVE NEGATIVE   Leukocytes,Ua MODERATE (A) NEGATIVE   RBC / HPF 0-5 0 - 5 RBC/hpf   WBC, UA 11-20 0 -  5 WBC/hpf   Bacteria, UA FEW (A) NONE SEEN   Squamous Epithelial / HPF 21-50 0 - 5 /HPF   Mucus PRESENT    Hyaline Casts, UA PRESENT     Comment: Performed at [facility], [address], [place], [place] [address]  Sodium, urine, random     Status: None   Collection Time: 12/26/22 12:52 AM  Result Value Ref Range   Sodium, Ur 41 mmol/L    Comment: Performed at [facility], [address] [place], [place] [address]  CBG monitoring, ED     Status: Abnormal   Collection Time: 12/26/22  8:19 AM  Result Value Ref Range   Glucose-Capillary 126 (H) 70 - 99 mg/dL    Comment: Glucose reference range applies only to samples taken after fasting for at least 8 hours.  CBG monitoring, ED     Status: Abnormal   Collection Time: 12/26/22 11:55 AM  Result Value Ref Range   Glucose-Capillary 183 (H) 70 - 99 mg/dL    Comment: Glucose reference range applies only to samples taken after fasting for at least 8 hours.   DG Chest Port 1 View  Result Date:  12/25/2022 CLINICAL DATA:  Chest pain, vomiting, lethargic EXAM: PORTABLE CHEST 1 VIEW COMPARISON:  11/21/2022 FINDINGS: Single frontal view of the chest demonstrates postsurgical changes from median sternotomy. The cardiac silhouette is stable. No acute airspace disease, effusion, or pneumothorax. No acute bony abnormalities. IMPRESSION: 1. No acute intrathoracic process. Electronically Signed   By: Sharlet Salina M.D.   On: 12/25/2022 21:34    Pending Labs Unresulted Labs (From admission, onward)     Start     Ordered   12/27/22 0500  CBC  Daily,   R      12/26/22 0510   12/27/22 0500  Comprehensive metabolic panel  Daily,   R      12/26/22 0510            Vitals/Pain Today's Vitals   12/26/22 1000 12/26/22 1300 12/26/22 1338 12/26/22 1400  BP: 132/85 (!) 152/128  (!) 144/73  Pulse: 88 95 96 92  Resp: 16 18    Temp: 98.2 F (36.8 C) 98.4 F (36.9 C)    TempSrc: Oral Oral    SpO2: 99% 100% 100% 100%  Weight:      PainSc:        Isolation Precautions No active isolations  Medications Medications  atorvastatin (LIPITOR) tablet 80 mg (80 mg Oral Given 12/26/22 1004)  ezetimibe (ZETIA) tablet 10 mg (10 mg Oral Given 12/26/22 1006)  ranolazine (RANEXA) 12 hr tablet 500 mg (500 mg Oral Given 12/26/22 1006)  escitalopram (LEXAPRO) tablet 20 mg (20 mg Oral Given 12/26/22 1004)  LORazepam (ATIVAN) tablet 0.5 mg (0.5 mg Oral Given 12/26/22 0017)  insulin glargine-yfgn (SEMGLEE) injection 22 Units (22 Units Subcutaneous Given 12/26/22 1006)  insulin aspart (novoLOG) injection 4 Units (4 Units Subcutaneous Given 12/26/22 1243)  methimazole (TAPAZOLE) tablet 10 mg (has no administration in time range)  bisacodyl (DULCOLAX) EC tablet 5 mg (has no administration in time range)  pantoprazole (PROTONIX) EC tablet 40 mg (40 mg Oral Given 12/26/22 1004)  senna-docusate (Senokot-S) tablet 1 tablet (1 tablet Oral Patient Refused/Not Given 12/26/22 1010)  clopidogrel (PLAVIX) tablet 75 mg  (75 mg Oral Given 12/26/22 1006)  rivaroxaban (XARELTO) tablet 10 mg (10 mg Oral Given 12/26/22 1246)  baclofen (LIORESAL) tablet 10 mg (has no administration in time range)  albuterol (  PROVENTIL) (2.5 MG/3ML) 0.083% nebulizer solution 2.5 mg (has no administration in time range)  sodium chloride flush (NS) 0.9 % injection 3 mL (3 mLs Intravenous Given 12/26/22 1009)  sodium chloride flush (NS) 0.9 % injection 3 mL (3 mLs Intravenous Given 12/26/22 1009)  sodium chloride flush (NS) 0.9 % injection 3 mL (has no administration in time range)  0.9 %  sodium chloride infusion (has no administration in time range)  0.9 %  sodium chloride infusion ( Intravenous New Bag/Given 12/26/22 0440)  acetaminophen (TYLENOL) tablet 650 mg (has no administration in time range)    Or  acetaminophen (TYLENOL) suppository 650 mg (has no administration in time range)  ondansetron (ZOFRAN) tablet 4 mg (has no administration in time range)    Or  ondansetron (ZOFRAN) injection 4 mg (has no administration in time range)  polyethylene glycol (MIRALAX / GLYCOLAX) packet 17 g (17 g Oral Patient Refused/Not Given 12/26/22 1010)  cefTRIAXone (ROCEPHIN) injection 1 g (1 g Intramuscular Given 12/26/22 0853)  insulin aspart (novoLOG) injection 0-6 Units (1 Units Subcutaneous Given 12/26/22 1244)  sodium chloride 0.9 % bolus 1,000 mL (0 mLs Intravenous Stopped 12/25/22 2241)  ondansetron (ZOFRAN) injection 4 mg (4 mg Intravenous Given 12/25/22 2013)    Mobility non-ambulatory

## 2022-12-26 NOTE — Plan of Care (Signed)

## 2022-12-26 NOTE — Progress Notes (Signed)
Rt and Lt upper extremities assessed with [place] for PIV access. Attempt made at [facility] without success (not able to advance cath completely). Attempt made in [facility] without success as well. On assessment pt has extremely poor vasculature and no other suitable veins observed with [place]. Pt is not a candidate for ML or PICC due to GFR. It is the recommendation of this IV team RN that this pt have central access placed. Unit RN notified and will notify MD.

## 2022-12-26 NOTE — ED Notes (Signed)
Pt bed changed. Pt cleaned up and dried. Pt breakfast tray sat in front of her. Pt has no other needs at this time. Call light within reach, bed locked and in lowest position. Purewick in place.

## 2022-12-27 ENCOUNTER — Inpatient Hospital Stay (HOSPITAL_COMMUNITY): Payer: 59

## 2022-12-27 DIAGNOSIS — N179 Acute kidney failure, unspecified: Secondary | ICD-10-CM | POA: Diagnosis not present

## 2022-12-27 LAB — CBC
HCT: 29.9 % — ABNORMAL LOW (ref 36.0–46.0)
Hemoglobin: 9.8 g/dL — ABNORMAL LOW (ref 12.0–15.0)
MCH: 25.7 pg — ABNORMAL LOW (ref 26.0–34.0)
MCHC: 32.8 g/dL (ref 30.0–36.0)
MCV: 78.5 fL — ABNORMAL LOW (ref 80.0–100.0)
Platelets: 379 10*3/uL (ref 150–400)
RBC: 3.81 MIL/uL — ABNORMAL LOW (ref 3.87–5.11)
RDW: 15.9 % — ABNORMAL HIGH (ref 11.5–15.5)
WBC: 7.1 10*3/uL (ref 4.0–10.5)
nRBC: 0 % (ref 0.0–0.2)

## 2022-12-27 LAB — DIFFERENTIAL
Abs Immature Granulocytes: 0.03 10*3/uL (ref 0.00–0.07)
Basophils Absolute: 0 10*3/uL (ref 0.0–0.1)
Basophils Relative: 1 %
Eosinophils Absolute: 0.2 10*3/uL (ref 0.0–0.5)
Eosinophils Relative: 3 %
Immature Granulocytes: 0 %
Lymphocytes Relative: 22 %
Lymphs Abs: 1.5 10*3/uL (ref 0.7–4.0)
Monocytes Absolute: 0.6 10*3/uL (ref 0.1–1.0)
Monocytes Relative: 9 %
Neutro Abs: 4.7 10*3/uL (ref 1.7–7.7)
Neutrophils Relative %: 65 %

## 2022-12-27 LAB — COMPREHENSIVE METABOLIC PANEL
ALT: 13 U/L (ref 0–44)
AST: 14 U/L — ABNORMAL LOW (ref 15–41)
Albumin: 3.2 g/dL — ABNORMAL LOW (ref 3.5–5.0)
Alkaline Phosphatase: 59 U/L (ref 38–126)
Anion gap: 9 (ref 5–15)
BUN: 17 mg/dL (ref 6–20)
CO2: 23 mmol/L (ref 22–32)
Calcium: 8.6 mg/dL — ABNORMAL LOW (ref 8.9–10.3)
Chloride: 105 mmol/L (ref 98–111)
Creatinine, Ser: 0.93 mg/dL (ref 0.44–1.00)
GFR, Estimated: >60 mL/min (ref >60)
Glucose, Bld: 108 mg/dL — ABNORMAL HIGH (ref 70–99)
Potassium: 3.6 mmol/L (ref 3.5–5.1)
Sodium: 137 mmol/L (ref 135–145)
Total Bilirubin: 0.7 mg/dL (ref <1.2)
Total Protein: 7 g/dL (ref 6.5–8.1)

## 2022-12-27 LAB — GLUCOSE, CAPILLARY
Glucose-Capillary: 106 mg/dL — ABNORMAL HIGH (ref 70–99)
Glucose-Capillary: 110 mg/dL — ABNORMAL HIGH (ref 70–99)
Glucose-Capillary: 148 mg/dL — ABNORMAL HIGH (ref 70–99)

## 2022-12-27 MED ORDER — METOCLOPRAMIDE HCL 10 MG PO TABS
5.0000 mg | ORAL_TABLET | Freq: Three times a day (TID) | ORAL | Status: DC
Start: 2022-12-27 — End: 2022-12-28
  Administered 2022-12-27 – 2022-12-28 (×3): 5 mg via ORAL
  Filled 2022-12-27 (×3): qty 1

## 2022-12-27 MED ORDER — TAMSULOSIN HCL 0.4 MG PO CAPS
0.4000 mg | ORAL_CAPSULE | Freq: Every day | ORAL | Status: DC
  Administered 2022-12-27: 0.4 mg via ORAL
  Filled 2022-12-27: qty 1

## 2022-12-27 MED ORDER — AMLODIPINE BESYLATE 5 MG PO TABS
5.0000 mg | ORAL_TABLET | Freq: Every morning | ORAL | Status: DC
  Administered 2022-12-27 – 2022-12-28 (×2): 5 mg via ORAL
  Filled 2022-12-27 (×2): qty 1

## 2022-12-27 MED ORDER — GERHARDT'S BUTT CREAM
TOPICAL_CREAM | Freq: Two times a day (BID) | CUTANEOUS | Status: DC
  Filled 2022-12-27: qty 1

## 2022-12-27 MED ORDER — TECHNETIUM TC 99M SULFUR COLLOID
1.9200 | Freq: Once | INTRAVENOUS | Status: AC
  Administered 2022-12-27: 1.92 via INTRAVENOUS

## 2022-12-27 MED ORDER — METOPROLOL TARTRATE 50 MG PO TABS
100.0000 mg | ORAL_TABLET | Freq: Two times a day (BID) | ORAL | Status: DC
  Administered 2022-12-27 – 2022-12-28 (×3): 100 mg via ORAL
  Filled 2022-12-27 (×3): qty 2

## 2022-12-27 MED ORDER — SODIUM CHLORIDE 0.9 % IV SOLN
1.0000 g | INTRAVENOUS | Status: DC
  Administered 2022-12-28: 1 g via INTRAVENOUS
  Filled 2022-12-27: qty 10

## 2022-12-27 MED ORDER — BACLOFEN 10 MG PO TABS
10.0000 mg | ORAL_TABLET | Freq: Four times a day (QID) | ORAL | Status: DC
  Administered 2022-12-27 – 2022-12-28 (×4): 10 mg via ORAL
  Filled 2022-12-27 (×4): qty 1

## 2022-12-27 MED ORDER — ORAL CARE MOUTH RINSE: 15.0000 mL | OROMUCOSAL | Status: DC | PRN

## 2022-12-27 MED ORDER — STERILE WATER FOR INJECTION IJ SOLN
INTRAMUSCULAR | Status: AC
  Administered 2022-12-27: 10 mL
  Filled 2022-12-27: qty 10

## 2022-12-27 NOTE — Plan of Care (Signed)
  Problem: Health Behavior/Discharge Planning: Goal: Ability to identify and utilize available resources and services will improve Outcome: Progressing   Problem: Metabolic: Goal: Ability to maintain appropriate glucose levels will improve Outcome: Progressing   Problem: Nutritional: Goal: Maintenance of adequate nutrition will improve Outcome: Progressing   Problem: Skin Integrity: Goal: Risk for impaired skin integrity will decrease Outcome: Progressing   Problem: Clinical Measurements: Goal: Ability to maintain clinical measurements within normal limits will improve Outcome: Progressing   Problem: Pain Management: Goal: General experience of comfort will improve Outcome: Progressing   Problem: Safety: Goal: Ability to remain free from injury will improve Outcome: Progressing

## 2022-12-27 NOTE — Progress Notes (Addendum)
PROGRESS NOTE    Lori [name]  ZOX:[id number] DOB: September 24, 1970 DOA: 12/25/2022 PCP: Caesar Bookman, NP   Brief Narrative:  HPI:  Lori [name] is a 52 y.o. 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 During my evaluation at the bedside patient reported that she has nausea but denies any vomiting.  She also generalized weakness.  Patient denies any chest pain, shortness of breath, palpitation or abdominal pain, and diarrhea.  Patient is poor historian and mostly answered with shaking head left and right or up and down.   Patient has recent hospitalization 9/29 to 9/3 for acute metabolic encephalopathy.  Another admission from 10/18 to 11/1 for acute metabolic encephalopathy and agitation due to dehydration in the setting of nausea and vomiting.  During discharge dose of methimazole has been increased to 10 mg daily and patient's tachycardia has been resolved.  She was set up to see outpatient endocrinology for follow-up.     ED Course:  At presentation to ED patient is hemodynamically stable except soft blood pressure 98/78.  UA cloudy appearance, moderate leukocyte but stable and few bacteria. UDS positive with benzodiazepine. CBC showing leukocytosis 12.2, stable H&H 11.5 and 35, low MCV 79, elevated platelet 478 which is around baseline. CMP unremarkable except elevated BUN 23, elevated creatinine 2.23 and GFR 26.  Otherwise unremarkable.  Blood alcohol level less than 10. Chest x-ray no acute intrathoracic process.   In the ED patient has been treated and 1 L  of NS bolus and Zofran 4 mg.   Physician reported that patient seems very dehydrated and having generalized weakness/lethargy.  However patient is alert oriented.  Denies any abdominal pain.  Physical exam no sign of acute abdomen.  Hospitalist has been consulted for further evaluation management of dehydration and AKI.  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  Baseline normal renal function.  Received IV fluids and renal function now normal.  Was tolerating soft diet yesterday.  Has been n.p.o. from midnight due to gastric emptying study scheduled for this morning.  After the study, she says that she would like to see how she tolerates regular diet and would like to go back home tomorrow if she is okay.  Continue symptomatic treatment in the meantime.  No more IV fluids needed.  Will restart on dysphagia 3 diet as recommended by SLP previously.  Gastric emptying study is done but results are pending.   Acute cystitis/UTI UA raising concern for UTI.  Continue Rocephin and follow culture.   Hyperthyroidism -Continue methimazole 10 mg daily.   Essential hypertension  Chronic diastolic heart failure preserved EF 60 to 65% Patient is Durick is now completed.  She was hypotensive in the ED but now hypertensive.  She appears to be on amlodipine, metoprolol and combination of  olmesartan/hydrochlorothiazide.  Will resume metoprolol and amlodipine but hold olmesartan and hydrochlorothiazide since she just recovered from AKI.    CVA with residual left-sided spastic paresis - Continue Plavix 75 mg daily, Lipitor, Zetia Continue muscle relaxant.  Holding gabapentin in the setting of AKI and hypotension.   History of CAD - Continue Plavix, Ranexa and statin.   Chronic Microcytic anemia/ History of DVT 2023 on Xarelto -Stable H&H 11.5 and 35.  Low MCV 79. - Per chart review and patient was admitted 11/1 found to have positive gastric, occult secondary from Mallory-Weiss tear related to nausea and vomiting.  Was treated with IV Protonix.  Patient's H&H remained stable and on discharge patient being started on Plavix and Xarelto. -Given patient hemoglobin is stable more than above baseline plan to continue Plavix and Xarelto.  Will continue to monitor CBC.   DM type 2 insulin-dependent -A1c 8.5 in 11/24/2022.  Continue dysphagia 3 diabetic diet. - Continue Semglee 22 units, continue to check POC blood glucose 3 times daily with meal and low sliding scale insulin as needed.   History of dysphagia - Patient being evaluated by speech during last admission recommended dysphagia 3 diet and thin liquid.  Continue dysphagia level 3 diet.   Generalized anxiety disorder -Continue Lexapro 20 mg daily.   GERD - Continue Protonix  DVT prophylaxis: rivaroxaban (XARELTO) tablet 10 mg Start: 12/26/22 1200 SCDs Start: 12/25/22 2324   Code Status: Full Code  Family Communication:  None present at bedside.  Plan of care discussed with patient in length and he/she verbalized understanding and agreed with it.  Status is: Inpatient Remains inpatient appropriate because: Monitoring overnight.   Estimated body mass index is 35 kg/m as calculated from the following:   Height as of 11/22/22: 5\' 2"  (1.575 m).   Weight as of this encounter: 86.8 kg.    Nutritional Assessment: Body  mass index is 35 kg/m.Marland Kitchen Seen by dietician.  I agree with the assessment and plan as outlined below: Nutrition Status:        . Skin Assessment: I have examined the patient's skin and I agree with the wound assessment as performed by the wound care RN as outlined below:    Consultants:  None  Procedures:  None  Antimicrobials:  Anti-infectives (From admission, onward)    Start     Dose/Rate Route Frequency Ordered Stop   12/26/22 0515  cefTRIAXone (ROCEPHIN) injection 1 g        1 g Intramuscular Every 24 hours 12/26/22 0512 01/02/23 0514         Subjective: Seen and examined this afternoon.  No new complaints.  She wants safe discharge and wants to be observed overnight.  Objective: Vitals:   12/26/22 2300 12/27/22 0500 12/27/22 0501 12/27/22 1322  BP: 134/82  (!) 133/91 (!) 189/93  Pulse: 96  86 (!) 102  Resp: 18  16 18   Temp: 97.9 F (36.6 C)  98.1 F (36.7 C) 98.8 F (37.1 C)  TempSrc: Oral  Oral Oral  SpO2: 99%  100% 99%  Weight:  86.8 kg     No intake or output data in the 24 hours ending 12/27/22 1401  Filed Weights   12/25/22 1716 12/27/22 0500  Weight: 91 kg 86.8 kg  Examination:  General exam: Appears calm and comfortable  Respiratory system: Clear to auscultation. Respiratory effort normal. Cardiovascular system: S1 & S2 heard, RRR. No JVD, murmurs, rubs, gallops or clicks. No pedal edema. Gastrointestinal system: Abdomen is nondistended, soft and nontender. No organomegaly or masses felt. Normal bowel sounds heard. Central nervous system: Alert and oriented.  Due to previous stroke, she has chronic dysarthria, left facial droop and left hemiparesis. Extremities: Symmetric 5 x 5 power. Skin: No rashes, lesions or ulcers.   Data Reviewed: I have personally reviewed following labs and imaging studies  CBC: Recent Labs  Lab 12/24/22 1139 12/25/22 1942 12/27/22 0846  WBC 9.0 12.2* 7.1  NEUTROABS 5,895 8.2* 4.7  HGB 12.1 11.5* 9.8*   HCT 38.9 35.9* 29.9*  MCV 82.2 79.6* 78.5*  PLT 500* 478* 379   Basic Metabolic Panel: Recent Labs  Lab 12/24/22 1139 12/25/22 1942 12/27/22 0846  NA 136 137 137  K 4.0 4.7 3.6  CL 102 104 105  CO2 22 22 23   GLUCOSE 229* 184* 108*  BUN 13 23* 17  CREATININE 0.87 2.23* 0.93  CALCIUM 9.3 9.2 8.6*   GFR: Estimated Creatinine Clearance: 74 mL/min (by C-G formula based on SCr of 0.93 mg/dL). Liver Function Tests: Recent Labs  Lab 12/25/22 1942 12/27/22 0846  AST 18 14*  ALT 13 13  ALKPHOS 63 59  BILITOT 0.8 0.7  PROT 7.7 7.0  ALBUMIN 3.7 3.2*   No results for input(s): "LIPASE", "AMYLASE" in the last 168 hours. No results for input(s): "AMMONIA" in the last 168 hours. Coagulation Profile: No results for input(s): "INR", "PROTIME" in the last 168 hours. Cardiac Enzymes: No results for input(s): "CKTOTAL", "CKMB", "CKMBINDEX", "TROPONINI" in the last 168 hours. BNP (last 3 results) No results for input(s): "PROBNP" in the last 8760 hours. HbA1C: No results for input(s): "HGBA1C" in the last 72 hours. CBG: Recent Labs  Lab 12/26/22 0819 12/26/22 1155 12/26/22 1611 12/26/22 2256 12/27/22 1318  GLUCAP 126* 183* 81 83 106*   Lipid Profile: No results for input(s): "CHOL", "HDL", "LDLCALC", "TRIG", "CHOLHDL", "LDLDIRECT" in the last 72 hours. Thyroid Function Tests: No results for input(s): "TSH", "T4TOTAL", "FREET4", "T3FREE", "THYROIDAB" in the last 72 hours. Anemia Panel: No results for input(s): "VITAMINB12", "FOLATE", "FERRITIN", "TIBC", "IRON", "RETICCTPCT" in the last 72 hours. Sepsis Labs: No results for input(s): "PROCALCITON", "LATICACIDVEN" in the last 168 hours.  No results found for this or any previous visit (from the past 240 hour(s)).   Radiology Studies: [place] EKG SITE RITE  Result Date: 12/26/2022 If Site Rite image not attached, placement could not be confirmed due to current cardiac rhythm.  DG Chest Port 1 View  Result Date:  12/25/2022 CLINICAL DATA:  Chest pain, vomiting, lethargic EXAM: PORTABLE CHEST 1 VIEW COMPARISON:  11/21/2022 FINDINGS: Single frontal view of the chest demonstrates postsurgical changes from median sternotomy. The cardiac silhouette is stable. No acute airspace disease, effusion, or pneumothorax. No acute bony abnormalities. IMPRESSION: 1. No acute intrathoracic process. Electronically Signed   By: Sharlet Salina M.D.   On: 12/25/2022 21:34    Scheduled Meds:  atorvastatin  80 mg Oral Daily   baclofen  10 mg Oral TID   cefTRIAXone (ROCEPHIN) IM  1 g Intramuscular Q24H   Chlorhexidine Gluconate Cloth  6 each Topical Daily   clopidogrel  75 mg Oral Daily   escitalopram  20 mg Oral q morning   ezetimibe  10 mg Oral Daily   Gerhardt's butt cream  Topical BID   insulin aspart  0-6 Units Subcutaneous TID WC   insulin aspart  4 Units Subcutaneous TID WC   insulin glargine-yfgn  22 Units Subcutaneous Daily   LORazepam  0.5 mg Oral QHS   methIMAzole  5 mg Oral Daily   nystatin  5 mL Oral QID   pantoprazole  40 mg Oral Daily   polyethylene glycol  17 g Oral Daily   ranolazine  500 mg Oral BID   rivaroxaban  10 mg Oral Daily   senna-docusate  1 tablet Oral Daily   sodium chloride flush  10-40 mL Intracatheter Q12H   sodium chloride flush  3 mL Intravenous Q12H   sodium chloride flush  3 mL Intravenous Q12H   Continuous Infusions:     LOS: 2 days   Hughie Closs, MD [facility]  12/27/2022, 2:01 PM   *Please note that this is a verbal dictation therefore any spelling or grammatical errors are due to the "Dragon Medical One" system interpretation.  Please page via Amion and do not message via secure chat for urgent patient care matters. Secure chat can be used for non urgent patient care matters.  How to contact the [facility] Attending or Consulting provider 7A - 7P or covering provider during after hours 7P -7A, for this patient?  Check the care team in [facility] and look for a)  attending/consulting TRH provider listed and b) the [facility] team listed. Page or secure chat 7A-7P. Log into [email or web address] and use [place]'s universal password to access. If you do not have the password, please contact the hospital operator. Locate the [facility] provider you are looking for under [facility] and page to a number that you can be directly reached. If you still have difficulty reaching the provider, please page the [facility] (Director on Call) for the Hospitalists listed on amion for assistance.

## 2022-12-28 DIAGNOSIS — N179 Acute kidney failure, unspecified: Secondary | ICD-10-CM | POA: Diagnosis not present

## 2022-12-28 LAB — CBC
HCT: 30.6 % — ABNORMAL LOW (ref 36.0–46.0)
Hemoglobin: 10.1 g/dL — ABNORMAL LOW (ref 12.0–15.0)
MCH: 25.8 pg — ABNORMAL LOW (ref 26.0–34.0)
MCHC: 33 g/dL (ref 30.0–36.0)
MCV: 78.3 fL — ABNORMAL LOW (ref 80.0–100.0)
Platelets: 374 10*3/uL (ref 150–400)
RBC: 3.91 MIL/uL (ref 3.87–5.11)
RDW: 15.8 % — ABNORMAL HIGH (ref 11.5–15.5)
WBC: 8.4 10*3/uL (ref 4.0–10.5)
nRBC: 0 % (ref 0.0–0.2)

## 2022-12-28 LAB — GLUCOSE, CAPILLARY
Glucose-Capillary: 179 mg/dL — ABNORMAL HIGH (ref 70–99)
Glucose-Capillary: 225 mg/dL — ABNORMAL HIGH (ref 70–99)

## 2022-12-28 LAB — COMPREHENSIVE METABOLIC PANEL
ALT: 16 U/L (ref 0–44)
AST: 15 U/L (ref 15–41)
Albumin: 3.2 g/dL — ABNORMAL LOW (ref 3.5–5.0)
Alkaline Phosphatase: 63 U/L (ref 38–126)
Anion gap: 10 (ref 5–15)
BUN: 14 mg/dL (ref 6–20)
CO2: 22 mmol/L (ref 22–32)
Calcium: 8.9 mg/dL (ref 8.9–10.3)
Chloride: 104 mmol/L (ref 98–111)
Creatinine, Ser: 0.88 mg/dL (ref 0.44–1.00)
GFR, Estimated: >60 mL/min (ref >60)
Glucose, Bld: 175 mg/dL — ABNORMAL HIGH (ref 70–99)
Potassium: 3.7 mmol/L (ref 3.5–5.1)
Sodium: 136 mmol/L (ref 135–145)
Total Bilirubin: 0.4 mg/dL (ref <1.2)
Total Protein: 7 g/dL (ref 6.5–8.1)

## 2022-12-28 MED ORDER — METOCLOPRAMIDE HCL 5 MG PO TABS: 5.0000 mg | ORAL_TABLET | Freq: Three times a day (TID) | ORAL | 0 refills | Status: DC

## 2022-12-28 MED ORDER — NITROFURANTOIN MONOHYD MACRO 100 MG PO CAPS: 100.0000 mg | ORAL_CAPSULE | Freq: Two times a day (BID) | ORAL | 0 refills | Status: DC

## 2022-12-28 NOTE — Plan of Care (Signed)
  Problem: Education: Goal: Ability to describe self-care measures that may prevent or decrease complications (Diabetes Survival Skills Education) will improve Outcome: Progressing   Problem: Coping: Goal: Ability to adjust to condition or change in health will improve Outcome: Progressing   Problem: Health Behavior/Discharge Planning: Goal: Ability to identify and utilize available resources and services will improve Outcome: Progressing   Problem: Metabolic: Goal: Ability to maintain appropriate glucose levels will improve Outcome: Progressing   Problem: Nutritional: Goal: Maintenance of adequate nutrition will improve Outcome: Progressing   Problem: Clinical Measurements: Goal: Ability to maintain clinical measurements within normal limits will improve Outcome: Progressing   Problem: Coping: Goal: Level of anxiety will decrease Outcome: Progressing   Problem: Pain Management: Goal: General experience of comfort will improve Outcome: Progressing   Problem: Safety: Goal: Ability to remain free from injury will improve Outcome: Progressing   Problem: Skin Integrity: Goal: Risk for impaired skin integrity will decrease Outcome: Progressing

## 2022-12-28 NOTE — TOC Initial Note (Addendum)
Transition of Care [facility]) - Initial/Assessment Note    Patient Details  Name: Lori [name] MRN: [id number] Date of Birth: 06/29/1970  Transition of Care [facility]) CM/SW Contact:    Adrian Prows, RN Phone Number: 12/28/2022, 11:33 AM  Clinical Narrative:                 TOC for d/c planning; orders received for HHPT/OT; spoke w/ pt in room; pt says she is from home; she plans to return at d/c; she needs transport home by [facility]; pt says she has wheel chair, BSC, and shower chair; there are grab bars in shower; she denies SDOH risks; pt gave d/c address [address] [place] [address]; pt requested [facility] services; spoke w/ Kandee Keen at agency; he says pt is active w/ agency; pt notified, and agency contact info placed in follow up provider section of d/c instructions; contacted pt's husband Kurtis Bushman, and he will be at home to receive pt; [facility] called at [phone]; spoke w/ operator # [phone]; no TOC needs.  Expected Discharge Plan: Home w Home Health Services Barriers to Discharge: No Barriers Identified   Patient Goals and CMS Choice Patient states their goals for this hospitalization and ongoing recovery are:: home CMS Medicare.gov Compare Post Acute Care list provided to:: Patient        Expected Discharge Plan and Services   Discharge Planning Services: CM Consult Post Acute Care Choice: Home Health Living arrangements for the past 2 months: Apartment Expected Discharge Date: 12/28/22               DME Arranged: N/A DME Agency: NA       HH Arranged: PT, OT HH Agency: [facility] Care Date [facility] Agency Contacted: 12/28/22 Time HH Agency Contacted: 1131 Representative spoke with at [facility] Agency: Kandee Keen  Prior Living Arrangements/Services Living arrangements for the past 2 months: Apartment Lives with:: Spouse Patient language and need for interpreter reviewed:: Yes Do you feel safe going back to the place where you live?: Yes      Need for Family  Participation in Patient Care: Yes (Comment) Care giver support system in place?: Yes (comment) Current home services: DME, Home OT, Home PT (wheel chair, BSC, shower chair; HHPT/OT w/ [facility]) Criminal Activity/Legal Involvement Pertinent to Current Situation/Hospitalization: No - Comment as needed  Activities of Daily Living      Permission Sought/Granted Permission sought to share information with : Case Manager Permission granted to share information with : Yes, Verbal Permission Granted  Share Information with NAME: Case Manager     Permission granted to share info w Relationship: Kurtis Bushman (spouse) [phone]     Emotional Assessment Appearance:: Appears stated age Attitude/Demeanor/Rapport: Gracious Affect (typically observed): Accepting Orientation: : Oriented to Self, Oriented to Place, Oriented to  Time, Oriented to Situation Alcohol / Substance Use: Not Applicable Psych Involvement: No (comment)  Admission diagnosis:  AKI (acute kidney injury) (HCC) [N17.9] Acute kidney injury [facility]) [N17.9] Patient Active Problem List   Diagnosis Date Noted   Acute cystitis 12/26/2022   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 12/25/2022   History of DVT (deep vein thrombosis) 12/25/2022   S/P total hysterectomy 12/24/2022   Delirium 11/22/2022   Dehydration 11/22/2022   Acute encephalopathy 11/04/2022   Chronic anticoagulation 11/04/2022   Hypotension 11/04/2022   Acute metabolic encephalopathy 11/04/2022   Morbid obesity (HCC) 10/13/2022   Aspiration pneumonia (HCC) 10/13/2022   Chronic diastolic CHF (congestive heart failure) (HCC) 10/13/2022  Chronic anemia 10/13/2022   Acute pancreatitis 10/11/2022   Chest pain of uncertain etiology 10/06/2022   History of CAD (coronary artery disease) 10/06/2022   Generalized anxiety disorder 10/06/2022   Reactive airway disease 10/06/2022   UTI (urinary tract infection) 07/04/2022   Mild intermittent asthma, uncomplicated 07/04/2022    Lethargic 05/01/2022   Hyponatremia 04/30/2022   Hyperkalemia 04/30/2022   AKI (acute kidney injury) (HCC) 04/30/2022   Wheelchair dependence 04/08/2022   Spasticity as late effect of cerebrovascular accident (CVA) 04/08/2022   Left hemiplegia (HCC) 04/08/2022   Dyspnea 11/02/2021   GERD (gastroesophageal reflux disease) 11/02/2021   Nausea and vomiting 08/25/2021   Acute bronchitis 08/24/2021   Hypothyroidism 07/10/2021   Functional urinary incontinence 05/17/2021   MDD (major depressive disorder), recurrent episode, severe (HCC) 04/09/2021   Diabetic gastroparesis (HCC) 03/16/2021   Migraines 03/16/2021   History of CVA with residual deficit    Debility 02/26/2021   DKA (diabetic ketoacidosis) (HCC) 02/19/2021   Weakness    Insulin dependent type 2 diabetes mellitus (HCC)    Aphasia    Spell of abnormal behavior    History of CVA with spastic paresis (cerebrovascular accident) 01/19/2021   Tachycardia 01/19/2021   CAD (coronary artery disease) 01/19/2021   Spastic hemiparesis of left 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 (HCC) 01/19/2021   Hyperthyroidism    Essential hypertension    Pheochromocytoma    Depression    HLD (hyperlipidemia)    PCP:  Caesar Bookman, NP Pharmacy:   [facility] [phone] - [place], [place] - [address] AT [facility] [address] [place] [address] Phone: [phone] Fax: [phone]     Social Determinants of Health (SDOH) Social History: SDOH Screenings   Food Insecurity: No Food Insecurity (12/28/2022)  Housing: Patient Declined (12/28/2022)  Transportation Needs: No Transportation Needs (12/28/2022)  Utilities: Not At Risk (12/28/2022)  Depression (PHQ2-9): Low Risk  (12/24/2022)  Recent Concern: Depression (PHQ2-9) - Medium Risk (10/28/2022)  Tobacco Use: Low Risk  (12/25/2022)   SDOH Interventions: Food Insecurity Interventions: Intervention Not Indicated, Inpatient  TOC Housing Interventions: Intervention Not Indicated, Inpatient TOC Transportation Interventions: Intervention Not Indicated, Inpatient TOC Utilities Interventions: Intervention Not Indicated, Inpatient TOC   Readmission Risk Interventions    12/28/2022   11:30 AM 11/05/2022    1:38 PM 10/15/2022   10:25 AM  Readmission Risk Prevention Plan  Transportation Screening Complete Complete Complete  Medication Review Oceanographer) Complete Complete   PCP or Specialist appointment within 3-5 days of discharge Complete Complete Complete  HRI or Home Care Consult Complete Complete Complete  SW Recovery Care/Counseling Consult Complete Complete Complete  Palliative Care Screening Not Applicable Not Applicable Not Applicable  Skilled Nursing Facility Not Applicable Not Applicable Not Applicable

## 2022-12-28 NOTE — Progress Notes (Signed)
Removed the PICC as ordered without complications. Educated patient that checking dressing of previous PICC line area daily. Patient is staying in the bed for 30 min. Informed patient's RN regarding this and reinforce for dressing care at home. HS [employer]

## 2022-12-28 NOTE — Discharge Summary (Signed)
Physician Discharge Summary  Lori [name] XLK:[id number] DOB: 14-Aug-1970 DOA: 12/25/2022  PCP: Caesar Bookman, NP  Admit date: 12/25/2022 Discharge date: 12/28/2022 30 Day Unplanned Readmission Risk Score    Flowsheet Row ED to Hosp-Admission (Current) from 12/25/2022 in [facility]  30 Day Unplanned Readmission Risk Score (%) 40.03 Filed at 12/28/2022 0800       This score is the patient's risk of an unplanned readmission within 30 days of being discharged (0 -100%). The score is based on dignosis, age, lab data, medications, orders, and past utilization.   Low:  0-14.9   Medium: 15-21.9   High: 22-29.9   Extreme: 30 and above          Admitted From: Home Disposition: Home  Recommendations for Outpatient Follow-up:  Follow up with PCP in 1-2 weeks Please obtain BMP/CBC in one week Please follow up with your PCP on the following pending results: Unresulted Labs (From admission, onward)     Start     Ordered   12/27/22 0752  CBC with Differential/Platelet  ONCE - STAT,   STAT        12/27/22 0751   12/27/22 0752  Basic metabolic panel  ONCE - STAT,   STAT        12/27/22 0751   12/27/22 0500  CBC  Daily,   R      12/26/22 0510   12/27/22 0500  Comprehensive metabolic panel  Daily,   R      12/26/22 0510              Home Health: Yes Equipment/Devices: None  Discharge Condition: Stable CODE STATUS: Full code Diet recommendation: Cardiac  Subjective: Seen and examined.  No complaints.  Agreeable with discharging home today.  She is requesting transportation/PTAR and I have notified TOC.  Brief/Interim Summary: Lori [name] is a 52 y.o. 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  Patient has recent hospitalization 9/29 to 9/3 for acute metabolic encephalopathy.  Another admission from 10/18 to 11/1 for acute metabolic encephalopathy and agitation due to dehydration in the setting of nausea and vomiting.  During discharge dose of methimazole has been increased to 10 mg daily and patient's tachycardia has been resolved.  She was set up to see outpatient endocrinology for follow-up.   At presentation to ED patient is hemodynamically stable except soft blood pressure 98/78. UA cloudy appearance, moderate leukocyte but stable and few bacteria. UDS positive with benzodiazepine.CMP unremarkable except elevated BUN 23, elevated creatinine 2.23 and GFR 26.  Admitted under hospital service.  Details below.   Intractable nausea/vomiting/diabetic gastroparesis/prerenal acute kidney injury secondary to dehydration: Pressure soft upon arrival, CMP showing creatinine 2.23 and GFR 27.  Baseline normal renal function.  Received IV fluids and renal function improved and back to normal.  Recurrent hospitalization with similar symptoms of nausea vomiting and history of immobility with diabetes raised concern for possible gastroparesis so we underwent gastric emptying study which confirmed the diagnosis.  Patient was started on Reglan, she improved.  Tolerating regular diet.  No complaints.  She is being discharged home today in stable  condition with instructions to take Reglan 5 mg p.o. 3 times daily before meals.   Acute cystitis/UTI UA raising concern for UTI.  For some reason, urine was never sent for culture.  She is stable and asymptomatic.  Based on the previous urine culture which grew Klebsiella pneumonia, I am discharging her on 7 days of Macrobid as she has allergy to  fluoroquinolones.   Hyperthyroidism -Continue methimazole    Essential hypertension Chronic diastolic heart failure preserved EF 60 to 65% Patient is Durick is now completed.  She was hypotensive in the ED but blood pressure improved, PTA medications resumed.    CVA with residual left-sided spastic paresis - Continue Plavix 75 mg daily, Lipitor, Zetia Continue muscle relaxant.  Holding gabapentin in the setting of AKI and hypotension.   History of CAD - Continue Plavix, Ranexa and statin.   Chronic Microcytic anemia/ History of DVT 2023 on Xarelto -Stable H&H 11.5 and 35.  Low MCV 79. - Per chart review and patient was admitted 11/1 found to have positive gastric, occult secondary from Mallory-Weiss tear related to nausea and vomiting.  Was treated with IV Protonix.  Patient's H&H remained stable and on discharge patient being started on Plavix and Xarelto. -Given patient hemoglobin is stable more than above baseline plan to continue Plavix and Xarelto.     DM type 2 insulin-dependent -A1c 8.5 in 11/24/2022.  Continue dysphagia 3 diabetic diet. - Resume PTA medications.   History of dysphagia - Patient being evaluated by speech during last admission recommended dysphagia 3 diet and thin liquid.  Continue dysphagia level 3 diet.   Generalized anxiety disorder -Continue Lexapro 20 mg daily.   GERD - Continue Protonix  Discharge plan was discussed with patient and/or family member and they verbalized understanding and agreed with it.  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   History of DVT (deep vein thrombosis)    Discharge Instructions   Allergies as of 12/28/2022       Reactions   Asa [aspirin] Anaphylaxis, Swelling, Other (See Comments)   Throat closes   Contrast Media [iodinated Contrast Media] Anaphylaxis, Swelling   Imitrex [sumatriptan] Anaphylaxis   Ms Contin [morphine] Anaphylaxis, Swelling   Penicillins Swelling, Other (See Comments)   Mouth and eyes swell   Firvanq [vancomycin] Other (See Comments)   Red Man's Syndrome   Cipro [ciprofloxacin Hcl] Nausea And Vomiting   Pork-derived Products Other (See Comments)   No known allergy but does not want to eat it because of religious values   Wound Dressing Adhesive Itching, Rash        Medication List     STOP taking these medications    budesonide 0.25 MG/2ML nebulizer solution Commonly known as: PULMICORT       TAKE these medications    albuterol (2.5 MG/3ML) 0.083% nebulizer solution Commonly known as: PROVENTIL Take 3 mLs (2.5 mg total) by nebulization every 6 (six) hours as needed for wheezing or shortness of breath. What changed: Another medication with the same name was changed.  Make sure you understand how and when to take each.   albuterol 108 (90 Base) MCG/ACT inhaler Commonly known as: VENTOLIN HFA INHALE 2 PUFFS BY MOUTH EVERY 6 HOURS AS NEEDED FOR WHEEZING AND FOR SHORTNESS OF BREATH What changed: See the new instructions.   amLODipine 5 MG tablet Commonly known as: NORVASC Take 10 mg by mouth in the morning.   atorvastatin 80 MG tablet Commonly known as: LIPITOR Take 1 tablet (80 mg total) by mouth daily. What changed: when to take this   baclofen 10 MG tablet Commonly known as: LIORESAL TAKE 1 TABLET BY MOUTH 3 TIMES DAILY AT 9AM, 1PM AND 5PM FOR SPASTICITY   bisacodyl 5 MG EC tablet Commonly known as: DULCOLAX Take 1 tablet (5 mg total) by mouth daily as needed for moderate constipation.   clopidogrel 75 MG tablet Commonly  known as: PLAVIX Take 1 tablet (75 mg total) by mouth daily. What changed: when to take this   cyclobenzaprine 5 MG tablet Commonly known as: FLEXERIL TAKE 1 TABLET BY MOUTH 3 TIMES DAILY AS NEEDED FOR MUSCLE SPASMS   escitalopram 20 MG tablet Commonly known as: LEXAPRO Take 1 tablet (20 mg total) by mouth every morning.   ezetimibe 10 MG tablet Commonly known as: Zetia Take 1 tablet (10 mg total) by mouth daily. What changed: when to take this   FreeStyle Libre 14 Day Sensor Misc 1 each by Does not apply route as needed. DX: E11.69   FreeStyle Libre 2 Reader Devi 1 Device by Does not apply route daily. E11.69   gabapentin 100 MG capsule Commonly known as: NEURONTIN Take 100 mg by mouth 3 (three) times daily.   insulin lispro 100 UNIT/ML injection Commonly known as: HUMALOG Sliding scale AC, HS CBG 121 - 150: 2 units  CBG 151 - 200: 3 units  CBG 201 - 250: 5 units  CBG 251 - 300: 8 units  CBG 301 - 350: 11 units  CBG 351 - 400: 15 units  CBG > 400: call MD   insulin lispro 100 UNIT/ML injection Commonly known as: HUMALOG Inject 0.04 mLs (4 Units total) into the skin 3 (three) times daily with meals.   Lantus SoloStar 100 UNIT/ML Solostar Pen Generic drug: insulin glargine Inject 22 Units into the skin daily. What changed:  how much to take when to take this   LORazepam 0.5 MG tablet Commonly known as: ATIVAN Take 1 tablet (0.5 mg total) by mouth at bedtime.   methimazole 5 MG tablet Commonly known as: TAPAZOLE Take 1 tablet (5 mg total) by mouth every morning.   metoCLOPramide 5 MG tablet Commonly known as: REGLAN Take 1 tablet (5 mg total) by mouth 3 (three) times daily before meals.   metoprolol tartrate 100 MG tablet Commonly known as: LOPRESSOR Take 100 mg by mouth 2 (two) times daily.   nitrofurantoin (macrocrystal-monohydrate) 100 MG capsule Commonly known as: Macrobid Take 1 capsule (100 mg total) by mouth 2 (two) times daily for 7 days.    NovoLOG FlexPen 100 UNIT/ML FlexPen Generic drug: insulin aspart Inject 18 Units into the skin 3 (three) times daily with meals.   nystatin powder Commonly known as: MYCOSTATIN/NYSTOP Apply 1 Application topically 2 (two) times daily as needed (irritation under breast/skin folds).   olmesartan-hydrochlorothiazide 40-25 MG tablet Commonly known as: BENICAR HCT Take 1 tablet by mouth in the morning.   omeprazole 20 MG capsule Commonly known as: PRILOSEC Take 1 capsule (20 mg total) by mouth daily.  polyethylene glycol powder 17 GM/SCOOP powder Commonly known as: GLYCOLAX/MIRALAX Take 17 g by mouth daily. Hold for loose stool   ranolazine 500 MG 12 hr tablet Commonly known as: RANEXA Take 1 tablet (500 mg total) by mouth 2 (two) times daily.   rivaroxaban 10 MG Tabs tablet Commonly known as: XARELTO Take 1 tablet (10 mg total) by mouth daily.   senna-docusate 8.6-50 MG tablet Commonly known as: Senokot-S Take 1 tablet by mouth daily.   tamsulosin 0.4 MG Caps capsule Commonly known as: FLOMAX Take 0.4 mg by mouth at bedtime.   topiramate 25 MG tablet Commonly known as: TOPAMAX Take 1 tablet (25 mg total) by mouth 2 (two) times daily.   Voltaren 1 % Gel Generic drug: diclofenac Sodium Apply 2 g topically 4 (four) times daily.   zinc oxide 11.3 % Crea cream Commonly known as: BALMEX Apply 1 Application topically 2 (two) times daily.        Follow-up Information     Ngetich, Dinah C, NP Follow up in 1 week(s).   Specialty: Family Medicine Contact information: [address] [place] [address] [phone]         [facility], [facility] Follow up.   Specialty: Home Health Services Contact information: [address] [place] [address] [phone]                Allergies  Allergen Reactions   Asa [Aspirin] Anaphylaxis, Swelling and Other (See Comments)    Throat closes   Contrast Media [Iodinated Contrast Media]  Anaphylaxis and Swelling   Imitrex [Sumatriptan] Anaphylaxis   Ms Contin [Morphine] Anaphylaxis and Swelling   Penicillins Swelling and Other (See Comments)    Mouth and eyes swell   Firvanq [Vancomycin] Other (See Comments)    Red Man's Syndrome   Cipro [Ciprofloxacin Hcl] Nausea And Vomiting   Pork-Derived Products Other (See Comments)    No known allergy but does not want to eat it because of religious values   Wound Dressing Adhesive Itching and Rash    Consultations: None   Procedures/Studies: NM GASTRIC EMPTYING  Result Date: 12/27/2022 CLINICAL DATA:  Gastroparesis EXAM: NUCLEAR MEDICINE GASTRIC EMPTYING SCAN TECHNIQUE: After oral ingestion of radiolabeled meal, sequential abdominal images were obtained for 240 minutes. Residual percentage of activity remaining within the stomach was calculated at 60, 120 and 240 minutes. RADIOPHARMACEUTICALS:  1.92 mCi Tc-69m cephalocaudal Lloyd in egg beaters water COMPARISON:  None Available. FINDINGS: Expected location of the stomach in the left upper quadrant. Ingested meal empties the stomach gradually over the course of the study with 67% retention at 60 min and 52% retention at 120 min and 24% retention at 240 minutes (normal retention less than 30% at a 120 min). IMPRESSION: Delayed gastric emptying. Electronically Signed   By: Karen Kays M.D.   On: 12/27/2022 14:08   [place] EKG SITE RITE  Result Date: 12/26/2022 If Site Rite image not attached, placement could not be confirmed due to current cardiac rhythm.  DG Chest Port 1 View  Result Date: 12/25/2022 CLINICAL DATA:  Chest pain, vomiting, lethargic EXAM: PORTABLE CHEST 1 VIEW COMPARISON:  11/21/2022 FINDINGS: Single frontal view of the chest demonstrates postsurgical changes from median sternotomy. The cardiac silhouette is stable. No acute airspace disease, effusion, or pneumothorax. No acute bony abnormalities. IMPRESSION: 1. No acute intrathoracic process. Electronically Signed   By:  Sharlet Salina M.D.   On: 12/25/2022 21:34     Discharge Exam: Vitals:  12/28/22 0605 12/28/22 0819  BP: (!) 144/93 (!) 136/90  Pulse:  99  Resp:    Temp:  98.6 F (37 C)  SpO2:  100%   Vitals:   12/28/22 0509 12/28/22 0510 12/28/22 0605 12/28/22 0819  BP: (!) 144/93  (!) 144/93 (!) 136/90  Pulse: 84   99  Resp: 16     Temp: 98.9 F (37.2 C)   98.6 F (37 C)  TempSrc: Oral   Oral  SpO2: 98%   100%  Weight:  90.2 kg      General: Pt is alert, awake, not in acute distress Cardiovascular: RRR, S1/S2 +, no rubs, no gallops Respiratory: CTA bilaterally, no wheezing, no rhonchi Abdominal: Soft, NT, ND, bowel sounds + Extremities: Left hemiparesis    The results of significant diagnostics from this hospitalization (including imaging, microbiology, ancillary and laboratory) are listed below for reference.     Microbiology: No results found for this or any previous visit (from the past 240 hour(s)).   Labs: BNP (last 3 results) Recent Labs    04/30/22 1245 05/22/22 0229 09/06/22 2242  BNP 26.3 49.6 19.5   Basic Metabolic Panel: Recent Labs  Lab 12/24/22 1139 12/25/22 1942 12/27/22 0846 12/28/22 0323  NA 136 137 137 136  K 4.0 4.7 3.6 3.7  CL 102 104 105 104  CO2 22 22 23 22   GLUCOSE 229* 184* 108* 175*  BUN 13 23* 17 14  CREATININE 0.87 2.23* 0.93 0.88  CALCIUM 9.3 9.2 8.6* 8.9   Liver Function Tests: Recent Labs  Lab 12/25/22 1942 12/27/22 0846 12/28/22 0323  AST 18 14* 15  ALT 13 13 16   ALKPHOS 63 59 63  BILITOT 0.8 0.7 0.4  PROT 7.7 7.0 7.0  ALBUMIN 3.7 3.2* 3.2*   No results for input(s): "LIPASE", "AMYLASE" in the last 168 hours. No results for input(s): "AMMONIA" in the last 168 hours. CBC: Recent Labs  Lab 12/24/22 1139 12/25/22 1942 12/27/22 0846 12/28/22 0323  WBC 9.0 12.2* 7.1 8.4  NEUTROABS 5,895 8.2* 4.7  --   HGB 12.1 11.5* 9.8* 10.1*  HCT 38.9 35.9* 29.9* 30.6*  MCV 82.2 79.6* 78.5* 78.3*  PLT 500* 478* 379 374    Cardiac Enzymes: No results for input(s): "CKTOTAL", "CKMB", "CKMBINDEX", "TROPONINI" in the last 168 hours. BNP: Invalid input(s): "POCBNP" CBG: Recent Labs  Lab 12/26/22 2256 12/27/22 1318 12/27/22 1707 12/27/22 2035 12/28/22 0749  GLUCAP 83 106* 110* 148* 179*   D-Dimer No results for input(s): "DDIMER" in the last 72 hours. Hgb A1c No results for input(s): "HGBA1C" in the last 72 hours. Lipid Profile No results for input(s): "CHOL", "HDL", "LDLCALC", "TRIG", "CHOLHDL", "LDLDIRECT" in the last 72 hours. Thyroid function studies No results for input(s): "TSH", "T4TOTAL", "T3FREE", "THYROIDAB" in the last 72 hours.  Invalid input(s): "FREET3" Anemia work up No results for input(s): "VITAMINB12", "FOLATE", "FERRITIN", "TIBC", "IRON", "RETICCTPCT" in the last 72 hours. Urinalysis    Component Value Date/Time   COLORURINE AMBER (A) 12/26/2022 0052   APPEARANCEUR CLOUDY (A) 12/26/2022 0052   LABSPEC 1.015 12/26/2022 0052   PHURINE 6.0 12/26/2022 0052   GLUCOSEU NEGATIVE 12/26/2022 0052   HGBUR NEGATIVE 12/26/2022 0052   BILIRUBINUR NEGATIVE 12/26/2022 0052   KETONESUR NEGATIVE 12/26/2022 0052   PROTEINUR NEGATIVE 12/26/2022 0052   NITRITE NEGATIVE 12/26/2022 0052   LEUKOCYTESUR MODERATE (A) 12/26/2022 0052   Sepsis Labs Recent Labs  Lab 12/24/22 1139 12/25/22 1942 12/27/22 0846 12/28/22 0323  WBC 9.0 12.2* 7.1  8.4   Microbiology No results found for this or any previous visit (from the past 240 hour(s)).  FURTHER DISCHARGE INSTRUCTIONS:   Get Medicines reviewed and adjusted: Please take all your medications with you for your next visit with your Primary MD   Laboratory/radiological data: Please request your Primary MD to go over all hospital tests and procedure/radiological results at the follow up, please ask your Primary MD to get all Hospital records sent to his/her office.   In some cases, they will be blood work, cultures and biopsy results pending  at the time of your discharge. Please request that your primary care M.D. goes through all the records of your hospital data and follows up on these results.   Also Note the following: If you experience worsening of your admission symptoms, develop shortness of breath, life threatening emergency, suicidal or homicidal thoughts you must seek medical attention immediately by calling 911 or calling your MD immediately  if symptoms less severe.   You must read complete instructions/literature along with all the possible adverse reactions/side effects for all the Medicines you take and that have been prescribed to you. Take any new Medicines after you have completely understood and accpet all the possible adverse reactions/side effects.    Do not drive when taking Pain medications or sleeping medications (Benzodaizepines)   Do not take more than prescribed Pain, Sleep and Anxiety Medications. It is not advisable to combine anxiety,sleep and pain medications without talking with your primary care practitioner   Special Instructions: If you have smoked or chewed Tobacco  in the last 2 yrs please stop smoking, stop any regular Alcohol  and or any Recreational drug use.   Wear Seat belts while driving.   Please note: You were cared for by a hospitalist during your hospital stay. Once you are discharged, your primary care physician will handle any further medical issues. Please note that NO REFILLS for any discharge medications will be authorized once you are discharged, as it is imperative that you return to your primary care physician (or establish a relationship with a primary care physician if you do not have one) for your post hospital discharge needs so that they can reassess your need for medications and monitor your lab values  Time coordinating discharge: Over 30 minutes  SIGNED:   Hughie Closs, MD  [facility] 12/28/2022, 11:44 AM *Please note that this is a verbal dictation  therefore any spelling or grammatical errors are due to the "Dragon Medical One" system interpretation. If 7PM-7AM, please contact night-coverage [email or web address]

## 2022-12-30 ENCOUNTER — Telehealth: Payer: Self-pay | Admitting: *Deleted

## 2022-12-30 NOTE — Transitions of Care (Post Inpatient/ED Visit) (Signed)
   12/30/2022  Name: Lori [name] MRN: [id number] DOB: 06/18/1970  Today's TOC FU Call Status: Today's TOC FU Call Status:: Successful TOC FU Call Completed TOC FU Call Complete Date: 12/30/22 Patient's Name and Date of Birth confirmed.  Transition Care Management Follow-up Telephone Call Date of Discharge: 12/28/22 Discharge Facility: [facility]) Type of Discharge: Inpatient Admission Primary Inpatient Discharge Diagnosis:: Acute Kidney Failure How have you been since you were released from the hospital?: Same Any questions or concerns?: Yes Patient Questions/Concerns:: Patient is having some acid reflux. Rn instructed patient on the medications that will hellp the gastirc reflux. Per husband she is not eating. RN talked about making ensure shakes. Patient Questions/Concerns Addressed: Other:  Items Reviewed: Did you receive and understand the discharge instructions provided?: Yes Medications obtained,verified, and reconciled?: Yes (Medications Reviewed) Any new allergies since your discharge?: Yes (Nitrofuratin) Dietary orders reviewed?: No Do you have support at home?: Yes People in Home: spouse, child(ren), adult Name of Support/Comfort Primary Source: [name] [name]  Medications Reviewed Today: Medications Reviewed Today   Medications were not reviewed in this encounter     Home Care and Equipment/Supplies: Were Home Health Services Ordered?: Yes Name of Home Health Agency:: [facility] Has Agency set up a time to come to your home?: Yes First Home Health Visit Date: 12/30/22 Any new equipment or medical supplies ordered?: NA  Functional Questionnaire: Do you need assistance with bathing/showering or dressing?: Yes Do you need assistance with meal preparation?: Yes Do you need assistance with eating?: No Do you have difficulty maintaining continence: No Do you need assistance with getting out of bed/getting out of a chair/moving?: Yes Do  you have difficulty managing or taking your medications?: Yes  Follow up appointments reviewed: PCP Follow-up appointment confirmed?: Yes Date of PCP follow-up appointment?: 12/31/22 Follow-up Provider: [facility] Follow-up appointment confirmed?: NA Do you need transportation to your follow-up appointment?: No Do you understand care options if your condition(s) worsen?: Yes-patient verbalized understanding  SDOH Interventions Today    Flowsheet Row Most Recent Value  SDOH Interventions   Food Insecurity Interventions Intervention Not Indicated  Housing Interventions Intervention Not Indicated  Transportation Interventions Intervention Not Indicated, Patient Resources (Friends/Family)  [Husband]  Utilities Interventions Intervention Not Indicated     Care guide assisted patient to get f/u within 7 days of discharge.  Per patient her nurse from [place] will help her to set her free style libre sensor on.  Gean Maidens BSN RN Population Health- Transition of Care Team.  [facility] [phone]

## 2022-12-31 ENCOUNTER — Encounter: Payer: Self-pay | Admitting: Family

## 2022-12-31 ENCOUNTER — Ambulatory Visit (INDEPENDENT_AMBULATORY_CARE_PROVIDER_SITE_OTHER): Payer: 59 | Admitting: Family

## 2022-12-31 ENCOUNTER — Telehealth: Payer: Self-pay

## 2022-12-31 VITALS — BP 136/86 | HR 100 | Temp 98.2°F | Resp 20 | Ht 62.0 in

## 2022-12-31 DIAGNOSIS — I10 Essential (primary) hypertension: Secondary | ICD-10-CM | POA: Diagnosis not present

## 2022-12-31 DIAGNOSIS — E785 Hyperlipidemia, unspecified: Secondary | ICD-10-CM | POA: Diagnosis not present

## 2022-12-31 DIAGNOSIS — B370 Candidal stomatitis: Secondary | ICD-10-CM

## 2022-12-31 DIAGNOSIS — E1169 Type 2 diabetes mellitus with other specified complication: Secondary | ICD-10-CM

## 2022-12-31 MED ORDER — FLUCONAZOLE 200 MG PO TABS: ORAL_TABLET | ORAL | 0 refills | Status: DC

## 2022-12-31 MED ORDER — FLUCONAZOLE 150 MG PO TABS: 150.0000 mg | ORAL_TABLET | Freq: Once | ORAL | 0 refills | Status: DC

## 2022-12-31 NOTE — Telephone Encounter (Signed)
[facility] pharmacy called about the patient receiving the Diflucan prescription today and state that it interfere with the atorvastatin. Ngetich, Dinah C, NP states that patient should hold Atorvastatin for 7 days .

## 2022-12-31 NOTE — Progress Notes (Unsigned)
Provider: Richarda Blade FNP-C  Casidee Jann, Donalee Citrin, NP  Patient Care Team: Thamas Appleyard, Donalee Citrin, NP as PCP - General (Family Medicine) Orbie Pyo, MD as PCP - Cardiology (Cardiology) [facility]  Extended Emergency Contact Information Primary Emergency Contact: [name],[name] Mobile Phone: [phone] Relation: Spouse Preferred language: English Interpreter needed? No Secondary Emergency Contact: [name],[name] Home Phone: [phone] Mobile Phone: [phone] Relation: Daughter Mother: [facility] Phone: [phone] Mobile Phone: [phone]  Code Status:  Full Code  Goals of care: Advanced Directive information    12/25/2022    5:16 PM  Advanced Directives  Does Patient Have a Medical Advance Directive? No  Would patient like information on creating a medical advance directive? No - Patient declined     Chief Complaint  Patient presents with  . Hospitalization Follow-up    Patient presents today for a hospital follow-up. She was admitted into [facility] on 12/25/22-12/28/22 for Acute Kidney injury.    HPI:  Pt is a 52 y.o. female seen today for an acute visit for hospitalization follow up 12/25/2022 - 12/28/2022    Past Medical History:  Diagnosis Date  . CAD (coronary artery disease)   . Depression   . Diabetes mellitus without complication (HCC)   . History of CT scan   . History of mammogram   . History of MRI   . Hypertension   . Pheochromocytoma   . Stroke (HCC)   . Thyroid disease    Past Surgical History:  Procedure Laterality Date  . ABDOMINAL HYSTERECTOMY    . CESAREAN SECTION     3  . CORONARY ARTERY BYPASS GRAFT    . Right adrenal gland removal for pheochromocytoma Right     Allergies  Allergen Reactions  . Asa [Aspirin] Anaphylaxis, Swelling and Other (See Comments)    Throat closes  . Contrast Media [Iodinated Contrast Media] Anaphylaxis and Swelling  . Imitrex [Sumatriptan] Anaphylaxis  . Ms  Contin [Morphine] Anaphylaxis and Swelling  . Penicillins Swelling and Other (See Comments)    Mouth and eyes swell  . Firvanq [Vancomycin] Other (See Comments)    Red Man's Syndrome  . Cipro [Ciprofloxacin Hcl] Nausea And Vomiting  . Pork-Derived Products Other (See Comments)    No known allergy but does not want to eat it because of religious values  . Nitrofuran Derivatives Anxiety  . Wound Dressing Adhesive Itching and Rash    Outpatient Encounter Medications as of 12/31/2022  Medication Sig  . albuterol (PROVENTIL) (2.5 MG/3ML) 0.083% nebulizer solution Take 3 mLs (2.5 mg total) by nebulization every 6 (six) hours as needed for wheezing or shortness of breath.  Marland Kitchen albuterol (VENTOLIN HFA) 108 (90 Base) MCG/ACT inhaler INHALE 2 PUFFS BY MOUTH EVERY 6 HOURS AS NEEDED FOR WHEEZING AND FOR SHORTNESS OF BREATH (Patient taking differently: Inhale 2 puffs into the lungs every 6 (six) hours as needed for wheezing or shortness of breath.)  . amLODipine (NORVASC) 5 MG tablet Take 10 mg by mouth in the morning.  Marland Kitchen atorvastatin (LIPITOR) 80 MG tablet Take 1 tablet (80 mg total) by mouth daily. (Patient taking differently: Take 80 mg by mouth in the morning.)  . baclofen (LIORESAL) 10 MG tablet TAKE 1 TABLET BY MOUTH 3 TIMES DAILY AT 9AM, 1PM AND 5PM FOR SPASTICITY  . clopidogrel (PLAVIX) 75 MG tablet Take 1 tablet (75 mg total) by mouth daily. (Patient taking differently: Take 75 mg by mouth in the morning.)  . Continuous Glucose Receiver (FREESTYLE LIBRE 2  READER) DEVI 1 Device by Does not apply route daily. E11.69  . Continuous Glucose Sensor (FREESTYLE LIBRE 14 DAY SENSOR) MISC 1 each by Does not apply route as needed. DX: E11.69  . cyclobenzaprine (FLEXERIL) 5 MG tablet TAKE 1 TABLET BY MOUTH 3 TIMES DAILY AS NEEDED FOR MUSCLE SPASMS  . diclofenac Sodium (VOLTAREN) 1 % GEL Apply 2 g topically 4 (four) times daily.  Marland Kitchen escitalopram (LEXAPRO) 20 MG tablet Take 1 tablet (20 mg total) by mouth every  morning.  . ezetimibe (ZETIA) 10 MG tablet Take 1 tablet (10 mg total) by mouth daily. (Patient taking differently: Take 10 mg by mouth in the morning.)  . gabapentin (NEURONTIN) 100 MG capsule Take 100 mg by mouth 3 (three) times daily.  . insulin aspart (NOVOLOG FLEXPEN) 100 UNIT/ML FlexPen Inject 18 Units into the skin 3 (three) times daily with meals.  . insulin glargine (LANTUS SOLOSTAR) 100 UNIT/ML Solostar Pen Inject 22 Units into the skin daily. (Patient taking differently: Inject 20 Units into the skin at bedtime.)  . insulin lispro (HUMALOG) 100 UNIT/ML injection Sliding scale AC, HS CBG 121 - 150: 2 units  CBG 151 - 200: 3 units  CBG 201 - 250: 5 units  CBG 251 - 300: 8 units  CBG 301 - 350: 11 units  CBG 351 - 400: 15 units  CBG > 400: call MD  . insulin lispro (HUMALOG) 100 UNIT/ML injection Inject 0.04 mLs (4 Units total) into the skin 3 (three) times daily with meals.  Marland Kitchen LORazepam (ATIVAN) 0.5 MG tablet Take 1 tablet (0.5 mg total) by mouth at bedtime.  . methimazole (TAPAZOLE) 5 MG tablet Take 1 tablet (5 mg total) by mouth every morning.  . metoCLOPramide (REGLAN) 5 MG tablet Take 1 tablet (5 mg total) by mouth 3 (three) times daily before meals.  . metoprolol tartrate (LOPRESSOR) 100 MG tablet Take 100 mg by mouth 2 (two) times daily.  Marland Kitchen olmesartan-hydrochlorothiazide (BENICAR HCT) 40-25 MG tablet Take 1 tablet by mouth in the morning.  Marland Kitchen omeprazole (PRILOSEC) 20 MG capsule Take 1 capsule (20 mg total) by mouth daily.  . polyethylene glycol powder (GLYCOLAX/MIRALAX) 17 GM/SCOOP powder Take 17 g by mouth daily. Hold for loose stool  . ranolazine (RANEXA) 500 MG 12 hr tablet Take 1 tablet (500 mg total) by mouth 2 (two) times daily.  . rivaroxaban (XARELTO) 10 MG TABS tablet Take 1 tablet (10 mg total) by mouth daily.  . tamsulosin (FLOMAX) 0.4 MG CAPS capsule Take 0.4 mg by mouth at bedtime.  . topiramate (TOPAMAX) 25 MG tablet Take 1 tablet (25 mg total) by mouth 2 (two)  times daily.  Marland Kitchen zinc oxide (BALMEX) 11.3 % CREA cream Apply 1 Application topically 2 (two) times daily.  . bisacodyl (DULCOLAX) 5 MG EC tablet Take 1 tablet (5 mg total) by mouth daily as needed for moderate constipation. (Patient not taking: Reported on 12/27/2022)  . nitrofurantoin, macrocrystal-monohydrate, (MACROBID) 100 MG capsule Take 1 capsule (100 mg total) by mouth 2 (two) times daily for 7 days. (Patient not taking: Reported on 12/30/2022)  . nystatin (MYCOSTATIN/NYSTOP) powder Apply 1 Application topically 2 (two) times daily as needed (irritation under breast/skin folds). (Patient not taking: Reported on 12/27/2022)  . senna-docusate (SENOKOT-S) 8.6-50 MG tablet Take 1 tablet by mouth daily. (Patient not taking: Reported on 12/27/2022)   No facility-administered encounter medications on file as of 12/31/2022.    Review of Systems  Immunization History  Administered Date(s) Administered  .  Influenza-Unspecified 11/04/2016  . PPD Test 03/19/2016  . Tdap 02/07/2014   Pertinent  Health Maintenance Due  Topic Date Due  . FOOT EXAM  Never done  . OPHTHALMOLOGY EXAM  Never done  . Colonoscopy  Never done  . MAMMOGRAM  Never done  . INFLUENZA VACCINE  05/05/2023 (Originally 09/05/2022)  . HEMOGLOBIN A1C  05/25/2023      09/30/2022    2:16 PM 10/28/2022    1:49 PM 11/01/2022    3:05 PM 12/24/2022   10:28 AM 12/31/2022    8:49 AM  Fall Risk  Falls in the past year? 0 1 0 0 1  Was there an injury with Fall? 0 1  0 0  Fall Risk Category Calculator 0 3  0 1  Patient at Risk for Falls Due to No Fall Risks History of fall(s)   History of fall(s)  Fall risk Follow up Falls evaluation completed Falls evaluation completed   Falls evaluation completed   Functional Status Survey:    Vitals:   12/31/22 0844  BP: 136/86  Pulse: 100  Resp: 20  Temp: 98.2 F (36.8 C)  SpO2: 98%  Height: 5\' 2"  (1.575 m)   Body mass index is 36.37 kg/m. Physical Exam  Labs reviewed: Recent  Labs    11/24/22 0820 11/25/22 0520 11/26/22 0508 12/02/22 0530 12/05/22 0500 12/25/22 1942 12/27/22 0846 12/28/22 0323  NA 135 139   < > 136   < > 137 137 136  K 3.6 4.3   < > 4.0   < > 4.7 3.6 3.7  CL 104 106   < > 105   < > 104 105 104  CO2 22 22   < > 23   < > 22 23 22   GLUCOSE 240* 185*   < > 196*   < > 184* 108* 175*  BUN 8 14   < > 20   < > 23* 17 14  CREATININE 0.77 1.16*   < > 0.94   < > 2.23* 0.93 0.88  CALCIUM 8.9 9.2   < > 8.9   < > 9.2 8.6* 8.9  MG 2.0 2.1  --  1.7  --   --   --   --   PHOS 3.3 3.4  --  4.1  --   --   --   --    < > = values in this interval not displayed.   Recent Labs    12/25/22 1942 12/27/22 0846 12/28/22 0323  AST 18 14* 15  ALT 13 13 16   ALKPHOS 63 59 63  BILITOT 0.8 0.7 0.4  PROT 7.7 7.0 7.0  ALBUMIN 3.7 3.2* 3.2*   Recent Labs    12/24/22 1139 12/25/22 1942 12/27/22 0846 12/28/22 0323  WBC 9.0 12.2* 7.1 8.4  NEUTROABS 5,895 8.2* 4.7  --   HGB 12.1 11.5* 9.8* 10.1*  HCT 38.9 35.9* 29.9* 30.6*  MCV 82.2 79.6* 78.5* 78.3*  PLT 500* 478* 379 374   Lab Results  Component Value Date   TSH 1.306 11/25/2022   Lab Results  Component Value Date   HGBA1C 8.5 (H) 11/24/2022   Lab Results  Component Value Date   CHOL 132 10/07/2022   HDL 35 (L) 10/07/2022   LDLCALC 66 10/07/2022   TRIG 157 (H) 10/07/2022   CHOLHDL 3.8 10/07/2022    Significant Diagnostic Results in last 30 days:  NM GASTRIC EMPTYING  Result Date: 12/27/2022 CLINICAL DATA:  Gastroparesis EXAM: NUCLEAR  MEDICINE GASTRIC EMPTYING SCAN TECHNIQUE: After oral ingestion of radiolabeled meal, sequential abdominal images were obtained for 240 minutes. Residual percentage of activity remaining within the stomach was calculated at 60, 120 and 240 minutes. RADIOPHARMACEUTICALS:  1.92 mCi Tc-51m cephalocaudal Lloyd in egg beaters water COMPARISON:  None Available. FINDINGS: Expected location of the stomach in the left upper quadrant. Ingested meal empties the stomach  gradually over the course of the study with 67% retention at 60 min and 52% retention at 120 min and 24% retention at 240 minutes (normal retention less than 30% at a 120 min). IMPRESSION: Delayed gastric emptying. Electronically Signed   By: Karen Kays M.D.   On: 12/27/2022 14:08   [place] EKG SITE RITE  Result Date: 12/26/2022 If Site Rite image not attached, placement could not be confirmed due to current cardiac rhythm.  DG Chest Port 1 View  Result Date: 12/25/2022 CLINICAL DATA:  Chest pain, vomiting, lethargic EXAM: PORTABLE CHEST 1 VIEW COMPARISON:  11/21/2022 FINDINGS: Single frontal view of the chest demonstrates postsurgical changes from median sternotomy. The cardiac silhouette is stable. No acute airspace disease, effusion, or pneumothorax. No acute bony abnormalities. IMPRESSION: 1. No acute intrathoracic process. Electronically Signed   By: Sharlet Salina M.D.   On: 12/25/2022 21:34    Assessment/Plan There are no diagnoses linked to this encounter.   Family/ staff Communication: Reviewed plan of care with patient  Labs/tests ordered: None   Next Appointment:   Caesar Bookman, NP

## 2023-01-01 ENCOUNTER — Other Ambulatory Visit: Payer: Self-pay

## 2023-01-01 DIAGNOSIS — J4520 Mild intermittent asthma, uncomplicated: Secondary | ICD-10-CM

## 2023-01-01 DIAGNOSIS — K5901 Slow transit constipation: Secondary | ICD-10-CM

## 2023-01-01 DIAGNOSIS — E1169 Type 2 diabetes mellitus with other specified complication: Secondary | ICD-10-CM

## 2023-01-01 DIAGNOSIS — F419 Anxiety disorder, unspecified: Secondary | ICD-10-CM

## 2023-01-01 DIAGNOSIS — E0590 Thyrotoxicosis, unspecified without thyrotoxic crisis or storm: Secondary | ICD-10-CM

## 2023-01-01 LAB — CBC WITH DIFFERENTIAL/PLATELET
Absolute Lymphocytes: 1739 {cells}/uL (ref 850–3900)
Absolute Monocytes: 670 {cells}/uL (ref 200–950)
Basophils Absolute: 74 {cells}/uL (ref 0–200)
Basophils Relative: 0.8 %
Eosinophils Absolute: 112 {cells}/uL (ref 15–500)
Eosinophils Relative: 1.2 %
HCT: 36.2 % (ref 35.0–45.0)
Hemoglobin: 11.3 g/dL — ABNORMAL LOW (ref 11.7–15.5)
MCH: 25.2 pg — ABNORMAL LOW (ref 27.0–33.0)
MCHC: 31.2 g/dL — ABNORMAL LOW (ref 32.0–36.0)
MCV: 80.6 fL (ref 80.0–100.0)
MPV: 10.3 fL (ref 7.5–12.5)
Monocytes Relative: 7.2 %
Neutro Abs: 6705 {cells}/uL (ref 1500–7800)
Neutrophils Relative %: 72.1 %
Platelets: 468 10*3/uL — ABNORMAL HIGH (ref 140–400)
RBC: 4.49 10*6/uL (ref 3.80–5.10)
RDW: 15.3 % — ABNORMAL HIGH (ref 11.0–15.0)
Total Lymphocyte: 18.7 %
WBC: 9.3 10*3/uL (ref 3.8–10.8)

## 2023-01-01 LAB — COMPLETE METABOLIC PANEL WITH GFR
AG Ratio: 1.2 (calc) (ref 1.0–2.5)
ALT: 13 U/L (ref 6–29)
AST: 12 U/L (ref 10–35)
Albumin: 4.1 g/dL (ref 3.6–5.1)
Alkaline phosphatase (APISO): 79 U/L (ref 37–153)
BUN: 9 mg/dL (ref 7–25)
CO2: 22 mmol/L (ref 20–32)
Calcium: 9.7 mg/dL (ref 8.6–10.4)
Chloride: 102 mmol/L (ref 98–110)
Creat: 0.85 mg/dL (ref 0.50–1.03)
Globulin: 3.4 g/dL (ref 1.9–3.7)
Glucose, Bld: 267 mg/dL — ABNORMAL HIGH (ref 65–99)
Potassium: 4.1 mmol/L (ref 3.5–5.3)
Sodium: 140 mmol/L (ref 135–146)
Total Bilirubin: 0.4 mg/dL (ref 0.2–1.2)
Total Protein: 7.5 g/dL (ref 6.1–8.1)
eGFR: 83 mL/min/{1.73_m2} (ref >=60)

## 2023-01-01 MED ORDER — CLOPIDOGREL BISULFATE 75 MG PO TABS: 75.0000 mg | ORAL_TABLET | Freq: Every day | ORAL | 1 refills | Status: DC

## 2023-01-01 MED ORDER — EZETIMIBE 10 MG PO TABS: 10.0000 mg | ORAL_TABLET | Freq: Every day | ORAL | 1 refills | Status: DC

## 2023-01-01 MED ORDER — POLYETHYLENE GLYCOL 3350 17 GM/SCOOP PO POWD: 17.0000 g | Freq: Every day | ORAL | 3 refills | Status: DC

## 2023-01-01 MED ORDER — ATORVASTATIN CALCIUM 80 MG PO TABS: 80.0000 mg | ORAL_TABLET | Freq: Every day | ORAL | 1 refills | Status: DC

## 2023-01-01 MED ORDER — INSULIN LISPRO 100 UNIT/ML IJ SOLN: 4.0000 [IU] | Freq: Three times a day (TID) | INTRAMUSCULAR | 3 refills | Status: DC

## 2023-01-01 MED ORDER — ALBUTEROL SULFATE (2.5 MG/3ML) 0.083% IN NEBU: 2.5000 mg | INHALATION_SOLUTION | Freq: Four times a day (QID) | RESPIRATORY_TRACT | 5 refills | Status: DC | PRN

## 2023-01-01 MED ORDER — METHIMAZOLE 5 MG PO TABS: 5.0000 mg | ORAL_TABLET | Freq: Every morning | ORAL | 1 refills | Status: DC

## 2023-01-01 MED ORDER — RIVAROXABAN 10 MG PO TABS: 10.0000 mg | ORAL_TABLET | Freq: Every day | ORAL | 1 refills | Status: DC

## 2023-01-01 MED ORDER — ALBUTEROL SULFATE HFA 108 (90 BASE) MCG/ACT IN AERS: INHALATION_SPRAY | RESPIRATORY_TRACT | 5 refills | Status: DC

## 2023-01-01 MED ORDER — ESCITALOPRAM OXALATE 20 MG PO TABS: 20.0000 mg | ORAL_TABLET | Freq: Every morning | ORAL | 1 refills | Status: DC

## 2023-01-01 NOTE — Patient Instructions (Signed)
-   start on Fluconazole as below hold atorvastatin for 7 days then restart upon completing fluconazole

## 2023-01-01 NOTE — Telephone Encounter (Signed)
Patient call to advise of pharmacy change  due to insurance and would like for the following pending medications to be send to [facility]	[place].

## 2023-01-02 ENCOUNTER — Other Ambulatory Visit: Payer: Self-pay

## 2023-01-02 ENCOUNTER — Observation Stay (HOSPITAL_COMMUNITY)
Admission: EM | Admit: 2023-01-02 | Discharge: 2023-01-04 | Disposition: A | Discharge status: Home Health | Payer: 59 | Attending: Family Medicine | Admitting: Family Medicine

## 2023-01-02 ENCOUNTER — Encounter (HOSPITAL_COMMUNITY): Payer: Self-pay

## 2023-01-02 DIAGNOSIS — E785 Hyperlipidemia, unspecified: Secondary | ICD-10-CM | POA: Diagnosis not present

## 2023-01-02 DIAGNOSIS — K3184 Gastroparesis: Secondary | ICD-10-CM | POA: Diagnosis present

## 2023-01-02 DIAGNOSIS — Z86718 Personal history of other venous thrombosis and embolism: Secondary | ICD-10-CM

## 2023-01-02 DIAGNOSIS — D75839 Thrombocytosis, unspecified: Secondary | ICD-10-CM | POA: Diagnosis present

## 2023-01-02 DIAGNOSIS — I679 Cerebrovascular disease, unspecified: Secondary | ICD-10-CM | POA: Insufficient documentation

## 2023-01-02 DIAGNOSIS — E871 Hypo-osmolality and hyponatremia: Secondary | ICD-10-CM | POA: Diagnosis present

## 2023-01-02 DIAGNOSIS — E66811 Obesity, class 1: Secondary | ICD-10-CM | POA: Diagnosis present

## 2023-01-02 DIAGNOSIS — E119 Type 2 diabetes mellitus without complications: Secondary | ICD-10-CM

## 2023-01-02 DIAGNOSIS — F32A Depression, unspecified: Secondary | ICD-10-CM | POA: Diagnosis not present

## 2023-01-02 DIAGNOSIS — F411 Generalized anxiety disorder: Secondary | ICD-10-CM | POA: Diagnosis present

## 2023-01-02 DIAGNOSIS — I25118 Atherosclerotic heart disease of native coronary artery with other forms of angina pectoris: Secondary | ICD-10-CM | POA: Insufficient documentation

## 2023-01-02 DIAGNOSIS — E0580 Other thyrotoxicosis without thyrotoxic crisis or storm: Secondary | ICD-10-CM | POA: Insufficient documentation

## 2023-01-02 DIAGNOSIS — Z743 Need for continuous supervision: Secondary | ICD-10-CM | POA: Diagnosis not present

## 2023-01-02 DIAGNOSIS — Z8673 Personal history of transient ischemic attack (TIA), and cerebral infarction without residual deficits: Secondary | ICD-10-CM

## 2023-01-02 DIAGNOSIS — R112 Nausea with vomiting, unspecified: Principal | ICD-10-CM | POA: Diagnosis present

## 2023-01-02 DIAGNOSIS — R000 Tachycardia, unspecified: Secondary | ICD-10-CM | POA: Diagnosis not present

## 2023-01-02 DIAGNOSIS — Z7982 Long term (current) use of aspirin: Secondary | ICD-10-CM | POA: Insufficient documentation

## 2023-01-02 DIAGNOSIS — I5032 Chronic diastolic (congestive) heart failure: Secondary | ICD-10-CM | POA: Diagnosis present

## 2023-01-02 DIAGNOSIS — E860 Dehydration: Secondary | ICD-10-CM | POA: Diagnosis not present

## 2023-01-02 DIAGNOSIS — G4733 Obstructive sleep apnea (adult) (pediatric): Secondary | ICD-10-CM | POA: Insufficient documentation

## 2023-01-02 DIAGNOSIS — E1143 Type 2 diabetes mellitus with diabetic autonomic (poly)neuropathy: Secondary | ICD-10-CM | POA: Diagnosis not present

## 2023-01-02 DIAGNOSIS — R739 Hyperglycemia, unspecified: Secondary | ICD-10-CM | POA: Diagnosis not present

## 2023-01-02 DIAGNOSIS — G43909 Migraine, unspecified, not intractable, without status migrainosus: Secondary | ICD-10-CM | POA: Diagnosis present

## 2023-01-02 DIAGNOSIS — Z7902 Long term (current) use of antithrombotics/antiplatelets: Secondary | ICD-10-CM | POA: Diagnosis not present

## 2023-01-02 DIAGNOSIS — Z794 Long term (current) use of insulin: Secondary | ICD-10-CM | POA: Diagnosis not present

## 2023-01-02 DIAGNOSIS — D530 Protein deficiency anemia: Secondary | ICD-10-CM | POA: Diagnosis not present

## 2023-01-02 DIAGNOSIS — G8194 Hemiplegia, unspecified affecting left nondominant side: Secondary | ICD-10-CM | POA: Diagnosis not present

## 2023-01-02 DIAGNOSIS — Z9889 Other specified postprocedural states: Secondary | ICD-10-CM | POA: Diagnosis not present

## 2023-01-02 DIAGNOSIS — E876 Hypokalemia: Secondary | ICD-10-CM | POA: Diagnosis present

## 2023-01-02 DIAGNOSIS — Z951 Presence of aortocoronary bypass graft: Secondary | ICD-10-CM | POA: Insufficient documentation

## 2023-01-02 DIAGNOSIS — I10 Essential (primary) hypertension: Secondary | ICD-10-CM | POA: Diagnosis present

## 2023-01-02 DIAGNOSIS — Z8679 Personal history of other diseases of the circulatory system: Secondary | ICD-10-CM

## 2023-01-02 DIAGNOSIS — D509 Iron deficiency anemia, unspecified: Secondary | ICD-10-CM | POA: Diagnosis present

## 2023-01-02 DIAGNOSIS — R6889 Other general symptoms and signs: Secondary | ICD-10-CM | POA: Diagnosis not present

## 2023-01-02 DIAGNOSIS — K219 Gastro-esophageal reflux disease without esophagitis: Secondary | ICD-10-CM | POA: Diagnosis present

## 2023-01-02 DIAGNOSIS — I499 Cardiac arrhythmia, unspecified: Secondary | ICD-10-CM | POA: Diagnosis not present

## 2023-01-02 DIAGNOSIS — E1169 Type 2 diabetes mellitus with other specified complication: Secondary | ICD-10-CM

## 2023-01-02 DIAGNOSIS — E0590 Thyrotoxicosis, unspecified without thyrotoxic crisis or storm: Secondary | ICD-10-CM | POA: Diagnosis present

## 2023-01-02 LAB — COMPREHENSIVE METABOLIC PANEL
ALT: 16 U/L (ref 0–44)
AST: 13 U/L — ABNORMAL LOW (ref 15–41)
Albumin: 3.7 g/dL (ref 3.5–5.0)
Alkaline Phosphatase: 72 U/L (ref 38–126)
Anion gap: 13 (ref 5–15)
BUN: 8 mg/dL (ref 6–20)
CO2: 20 mmol/L — ABNORMAL LOW (ref 22–32)
Calcium: 8.5 mg/dL — ABNORMAL LOW (ref 8.9–10.3)
Chloride: 97 mmol/L — ABNORMAL LOW (ref 98–111)
Creatinine, Ser: 0.77 mg/dL (ref 0.44–1.00)
GFR, Estimated: >60 mL/min (ref >60)
Glucose, Bld: 235 mg/dL — ABNORMAL HIGH (ref 70–99)
Potassium: 3.3 mmol/L — ABNORMAL LOW (ref 3.5–5.1)
Sodium: 130 mmol/L — ABNORMAL LOW (ref 135–145)
Total Bilirubin: 0.8 mg/dL (ref <1.2)
Total Protein: 7.4 g/dL (ref 6.5–8.1)

## 2023-01-02 LAB — CBC WITH DIFFERENTIAL/PLATELET
Abs Immature Granulocytes: 0.04 10*3/uL (ref 0.00–0.07)
Basophils Absolute: 0.1 10*3/uL (ref 0.0–0.1)
Basophils Relative: 1 %
Eosinophils Absolute: 0.1 10*3/uL (ref 0.0–0.5)
Eosinophils Relative: 1 %
HCT: 33.7 % — ABNORMAL LOW (ref 36.0–46.0)
Hemoglobin: 10.9 g/dL — ABNORMAL LOW (ref 12.0–15.0)
Immature Granulocytes: 0 %
Lymphocytes Relative: 19 %
Lymphs Abs: 2.4 10*3/uL (ref 0.7–4.0)
MCH: 25.3 pg — ABNORMAL LOW (ref 26.0–34.0)
MCHC: 32.3 g/dL (ref 30.0–36.0)
MCV: 78.2 fL — ABNORMAL LOW (ref 80.0–100.0)
Monocytes Absolute: 0.8 10*3/uL (ref 0.1–1.0)
Monocytes Relative: 7 %
Neutro Abs: 8.9 10*3/uL — ABNORMAL HIGH (ref 1.7–7.7)
Neutrophils Relative %: 72 %
Platelets: 440 10*3/uL — ABNORMAL HIGH (ref 150–400)
RBC: 4.31 MIL/uL (ref 3.87–5.11)
RDW: 15.6 % — ABNORMAL HIGH (ref 11.5–15.5)
WBC: 12.3 10*3/uL — ABNORMAL HIGH (ref 4.0–10.5)
nRBC: 0 % (ref 0.0–0.2)

## 2023-01-02 LAB — PROTIME-INR
INR: 1.1 (ref 0.8–1.2)
Prothrombin Time: 14.6 s (ref 11.4–15.2)

## 2023-01-02 LAB — URINALYSIS, ROUTINE W REFLEX MICROSCOPIC
Bilirubin Urine: NEGATIVE
Glucose, UA: 150 mg/dL — AB
Hgb urine dipstick: NEGATIVE
Ketones, ur: 20 mg/dL — AB
Leukocytes,Ua: NEGATIVE
Nitrite: NEGATIVE
Protein, ur: NEGATIVE mg/dL
Specific Gravity, Urine: 1.006 (ref 1.005–1.030)
pH: 6 (ref 5.0–8.0)

## 2023-01-02 LAB — CBG MONITORING, ED: Glucose-Capillary: 205 mg/dL — ABNORMAL HIGH (ref 70–99)

## 2023-01-02 LAB — PHOSPHORUS: Phosphorus: 3.3 mg/dL (ref 2.5–4.6)

## 2023-01-02 LAB — GLUCOSE, CAPILLARY
Glucose-Capillary: 185 mg/dL — ABNORMAL HIGH (ref 70–99)
Glucose-Capillary: 193 mg/dL — ABNORMAL HIGH (ref 70–99)
Glucose-Capillary: 181 mg/dL — ABNORMAL HIGH (ref 70–99)

## 2023-01-02 LAB — LIPASE, BLOOD: Lipase: 46 U/L (ref 11–51)

## 2023-01-02 LAB — LACTIC ACID, PLASMA
Lactic Acid, Venous: 1.4 mmol/L (ref 0.5–1.9)
Lactic Acid, Venous: 0.8 mmol/L (ref 0.5–1.9)

## 2023-01-02 LAB — MAGNESIUM: Magnesium: 1.7 mg/dL (ref 1.7–2.4)

## 2023-01-02 MED ORDER — PANTOPRAZOLE SODIUM 40 MG IV SOLR
40.0000 mg | INTRAVENOUS | Status: DC
  Administered 2023-01-02 – 2023-01-03 (×2): 40 mg via INTRAVENOUS
  Filled 2023-01-02 (×2): qty 10

## 2023-01-02 MED ORDER — METOCLOPRAMIDE HCL 5 MG/ML IJ SOLN
10.0000 mg | Freq: Once | INTRAMUSCULAR | Status: AC
  Administered 2023-01-02: 10 mg via INTRAVENOUS
  Filled 2023-01-02: qty 2

## 2023-01-02 MED ORDER — METOPROLOL TARTRATE 5 MG/5ML IV SOLN
5.0000 mg | Freq: Four times a day (QID) | INTRAVENOUS | Status: DC
  Administered 2023-01-02 – 2023-01-03 (×5): 5 mg via INTRAVENOUS
  Filled 2023-01-02 (×5): qty 5

## 2023-01-02 MED ORDER — LACTATED RINGERS IV BOLUS
1000.0000 mL | Freq: Once | INTRAVENOUS | Status: AC
  Administered 2023-01-02: 1000 mL via INTRAVENOUS

## 2023-01-02 MED ORDER — POTASSIUM CHLORIDE 10 MEQ/100ML IV SOLN
10.0000 meq | INTRAVENOUS | Status: AC
  Administered 2023-01-02 (×4): 10 meq via INTRAVENOUS
  Filled 2023-01-02 (×4): qty 100

## 2023-01-02 MED ORDER — METOPROLOL TARTRATE 5 MG/5ML IV SOLN
5.0000 mg | Freq: Once | INTRAVENOUS | Status: AC
  Administered 2023-01-02: 5 mg via INTRAVENOUS
  Filled 2023-01-02: qty 5

## 2023-01-02 MED ORDER — ORAL CARE MOUTH RINSE: 15.0000 mL | OROMUCOSAL | Status: DC | PRN

## 2023-01-02 MED ORDER — MAGNESIUM SULFATE 2 GM/50ML IV SOLN: 2.0000 g | Freq: Once | INTRAVENOUS | Status: DC

## 2023-01-02 MED ORDER — POTASSIUM CHLORIDE IN NACL 20-0.9 MEQ/L-% IV SOLN
INTRAVENOUS | Status: DC
  Filled 2023-01-02 (×4): qty 1000

## 2023-01-02 MED ORDER — SODIUM CHLORIDE 0.9 % IV SOLN
12.5000 mg | Freq: Four times a day (QID) | INTRAVENOUS | Status: DC | PRN
  Administered 2023-01-02: 12.5 mg via INTRAVENOUS
  Filled 2023-01-02: qty 12.5

## 2023-01-02 MED ORDER — METOCLOPRAMIDE HCL 5 MG/ML IJ SOLN
10.0000 mg | Freq: Four times a day (QID) | INTRAMUSCULAR | Status: DC
  Administered 2023-01-02 – 2023-01-03 (×4): 10 mg via INTRAVENOUS
  Filled 2023-01-02 (×4): qty 2

## 2023-01-02 NOTE — ED Notes (Signed)
Pt placed on purewick, consulted with P.A. before placing.

## 2023-01-02 NOTE — ED Notes (Signed)
ED TO INPATIENT HANDOFF REPORT  ED Nurse Name and Phone #:  Mellody Dance  [phone]  S Name/Age/Gender Lori [name] 52 y.o. female Room/Bed: WA04/WA04  Code Status   Code Status: Full Code  Home/SNF/Other Home Patient oriented to: self, place, time, and situation Is this baseline? Yes   Triage Complete: Triage complete  Chief Complaint Intractable nausea and vomiting [R11.2]  Triage Note Pt arrives via [facility] from home for c/o vomiting, pt reported to EMS initially that she was sent home with zofran for nausea/vomiting but that she hadn't taken it because "it doesn't work for me." Pt then later reported that approx. 2200 last night she did take x1 tablet of zofran, then began vomiting approx. 2300. Pt states she has had x5 episodes of vomiting since 2300.   Pt had f/u yesterday with her PCP and was diagnosed with "urinary infection and given diflucan but they told me I couldn't take my cholesterol medicine with it." Pt denies any pain or other c/o at this time excluding nausea/vomiting.    Allergies Allergies  Allergen Reactions   Asa [Aspirin] Anaphylaxis, Swelling and Other (See Comments)    Throat closes   Contrast Media [Iodinated Contrast Media] Anaphylaxis and Swelling   Imitrex [Sumatriptan] Anaphylaxis   Ms Contin [Morphine] Anaphylaxis and Swelling   Penicillins Swelling and Other (See Comments)    Mouth and eyes swell   Firvanq [Vancomycin] Other (See Comments)    Red Man's Syndrome   Cipro [Ciprofloxacin Hcl] Nausea And Vomiting   Pork-Derived Products Other (See Comments)    No known allergy but does not want to eat it because of religious values   Nitrofuran Derivatives Anxiety   Wound Dressing Adhesive Itching and Rash    Level of Care/Admitting Diagnosis ED Disposition     ED Disposition  Admit   Condition  --   Comment  Hospital Area: [facility]  HOSPITAL [[id number]]  Level of Care: Progressive [102]  Admit to  Progressive based on following criteria: GI, ENDOCRINE disease patients with GI bleeding, acute liver failure or pancreatitis, stable with diabetic ketoacidosis or thyrotoxicosis (hypothyroid) state.  May place patient in observation at [facility] or [facility] if equivalent level of care is available:: Yes  Covid Evaluation: Asymptomatic - no recent exposure (last 10 days) testing not required  Diagnosis: Intractable nausea and vomiting [720114]  Admitting Physician: Briscoe Deutscher [[id number]]  Attending Physician: Briscoe Deutscher [[id number]]          B Medical/Surgery History Past Medical History:  Diagnosis Date   CAD (coronary artery disease)    Depression    Diabetes mellitus without complication (HCC)    History of CT scan    History of mammogram    History of MRI    Hypertension    Pheochromocytoma    Stroke (HCC)    Thyroid disease    Past Surgical History:  Procedure Laterality Date   ABDOMINAL HYSTERECTOMY     CESAREAN SECTION     3   CORONARY ARTERY BYPASS GRAFT     Right adrenal gland removal for pheochromocytoma Right      A IV Location/Drains/Wounds Patient Lines/Drains/Airways Status     Active Line/Drains/Airways     Name Placement date Placement time Site Days   Peripheral IV 01/02/23 20 G 1.88" Anterior;Proximal;Right Forearm 01/02/23  0445  Forearm  less than 1   External Urinary Catheter 01/02/23  0500  --  less than 1  Wound / Incision (Open or Dehisced) 11/06/22 Irritant Dermatitis (Moisture Associated Skin Damage) Groin Anterior;Right;Left  11/06/22  1017  Groin  57            Intake/Output Last 24 hours  Intake/Output Summary (Last 24 hours) at 01/02/2023 1220 Last data filed at 01/02/2023 0703 Gross per 24 hour  Intake 2115.6 ml  Output 230 ml  Net 1885.6 ml    Labs/Imaging Results for orders placed or performed during the hospital encounter of 01/02/23 (from the past 48 hour(s))  Comprehensive metabolic panel     Status:  Abnormal   Collection Time: 01/02/23  4:40 AM  Result Value Ref Range   Sodium 130 (L) 135 - 145 mmol/L   Potassium 3.3 (L) 3.5 - 5.1 mmol/L   Chloride 97 (L) 98 - 111 mmol/L   CO2 20 (L) 22 - 32 mmol/L   Glucose, Bld 235 (H) 70 - 99 mg/dL    Comment: Glucose reference range applies only to samples taken after fasting for at least 8 hours.   BUN 8 6 - 20 mg/dL   Creatinine, Ser 5.28 0.44 - 1.00 mg/dL   Calcium 8.5 (L) 8.9 - 10.3 mg/dL   Total Protein 7.4 6.5 - 8.1 g/dL   Albumin 3.7 3.5 - 5.0 g/dL   AST 13 (L) 15 - 41 U/L   ALT 16 0 - 44 U/L   Alkaline Phosphatase 72 38 - 126 U/L   Total Bilirubin 0.8 <1.2 mg/dL   GFR, Estimated >41 >32 mL/min    Comment: (NOTE) Calculated using the CKD-EPI Creatinine Equation (2021)    Anion gap 13 5 - 15    Comment: Performed at [facility], [address], [place], [place] [address]  Lipase, blood     Status: None   Collection Time: 01/02/23  4:40 AM  Result Value Ref Range   Lipase 46 11 - 51 U/L    Comment: Performed at [facility], [address], [place], [place] [address]  CBC with Diff     Status: Abnormal   Collection Time: 01/02/23  4:40 AM  Result Value Ref Range   WBC 12.3 (H) 4.0 - 10.5 K/uL   RBC 4.31 3.87 - 5.11 MIL/uL   Hemoglobin 10.9 (L) 12.0 - 15.0 g/dL   HCT 66.4 (L) 40.3 - 47.4 %   MCV 78.2 (L) 80.0 - 100.0 fL   MCH 25.3 (L) 26.0 - 34.0 pg   MCHC 32.3 30.0 - 36.0 g/dL   RDW 25.9 (H) 56.3 - 87.5 %   Platelets 440 (H) 150 - 400 K/uL   nRBC 0.0 0.0 - 0.2 %   Neutrophils Relative % 72 %   Neutro Abs 8.9 (H) 1.7 - 7.7 K/uL   Lymphocytes Relative 19 %   Lymphs Abs 2.4 0.7 - 4.0 K/uL   Monocytes Relative 7 %   Monocytes Absolute 0.8 0.1 - 1.0 K/uL   Eosinophils Relative 1 %   Eosinophils Absolute 0.1 0.0 - 0.5 K/uL   Basophils Relative 1 %   Basophils Absolute 0.1 0.0 - 0.1 K/uL   Immature Granulocytes 0 %   Abs Immature Granulocytes 0.04 0.00 - 0.07 K/uL    Comment:  Performed at [facility], [address], [place], [place] [address]  Magnesium     Status: None   Collection Time: 01/02/23  4:40 AM  Result Value Ref Range   Magnesium 1.7 1.7 - 2.4 mg/dL  Comment: Performed at [facility], [address] [place], [place] [address]  Phosphorus     Status: None   Collection Time: 01/02/23  4:40 AM  Result Value Ref Range   Phosphorus 3.3 2.5 - 4.6 mg/dL    Comment: Performed at [facility], [address], [place], [place] [address]  Urinalysis, Routine w reflex microscopic -Urine, Clean Catch     Status: Abnormal   Collection Time: 01/02/23  5:58 AM  Result Value Ref Range   Color, Urine YELLOW YELLOW   APPearance CLEAR CLEAR   Specific Gravity, Urine 1.006 1.005 - 1.030   pH 6.0 5.0 - 8.0   Glucose, UA 150 (A) NEGATIVE mg/dL   Hgb urine dipstick NEGATIVE NEGATIVE   Bilirubin Urine NEGATIVE NEGATIVE   Ketones, ur 20 (A) NEGATIVE mg/dL   Protein, ur NEGATIVE NEGATIVE mg/dL   Nitrite NEGATIVE NEGATIVE   Leukocytes,Ua NEGATIVE NEGATIVE   RBC / HPF 0-5 0 - 5 RBC/hpf   WBC, UA 0-5 0 - 5 WBC/hpf   Bacteria, UA RARE (A) NONE SEEN   Squamous Epithelial / HPF 6-10 0 - 5 /HPF    Comment: Performed at [facility], [address], [place], [place] [address]  Lactic acid, plasma     Status: None   Collection Time: 01/02/23  5:59 AM  Result Value Ref Range   Lactic Acid, Venous 1.4 0.5 - 1.9 mmol/L    Comment: Performed at [facility], [address] [place], [place] [address]  Lactic acid, plasma     Status: None   Collection Time: 01/02/23  8:30 AM  Result Value Ref Range   Lactic Acid, Venous 0.8 0.5 - 1.9 mmol/L    Comment: Performed at [facility], [address], [place], [place] [address]   No results found.  Pending Labs Unresulted Labs (From admission, onward)     Start     Ordered   01/02/23 0945  Protime-INR  Once,    R        01/02/23 0945            Vitals/Pain Today's Vitals   01/02/23 0900 01/02/23 0930 01/02/23 1000 01/02/23 1213  BP: (!) 168/108 (!) 155/109 (!) 144/107   Pulse: (!) 116 (!) 107 100   Resp: (!) 23 18 (!) 25   Temp:    98.7 F (37.1 C)  TempSrc:    Oral  SpO2: 96% 96% 94%   Weight:      Height:      PainSc:        Isolation Precautions No active isolations  Medications Medications  promethazine (PHENERGAN) 12.5 mg in sodium chloride 0.9 % 50 mL IVPB (0 mg Intravenous Stopped 01/02/23 0703)  0.9 % NaCl with KCl 20 mEq/ L  infusion ( Intravenous New Bag/Given 01/02/23 0930)  potassium chloride 10 mEq in 100 mL IVPB (10 mEq Intravenous New Bag/Given 01/02/23 1148)  metoCLOPramide (REGLAN) injection 10 mg (10 mg Intravenous Given 01/02/23 1044)  pantoprazole (PROTONIX) injection 40 mg (40 mg Intravenous Given 01/02/23 0927)  lactated ringers bolus 1,000 mL (0 mLs Intravenous Stopped 01/02/23 0555)  metoCLOPramide (REGLAN) injection 10 mg (10 mg Intravenous Given 01/02/23 0513)  lactated ringers bolus 1,000 mL (0 mLs Intravenous Stopped 01/02/23 0703)  metoprolol tartrate (LOPRESSOR) injection 5 mg (5 mg Intravenous Given 01/02/23 0927)    Mobility power wheelchair     Focused Assessments    R  Recommendations: See Admitting Provider Note  Report given to:   Additional Notes:

## 2023-01-02 NOTE — ED Provider Notes (Signed)
[facility]ENC[facility] DEPT [facility] Emergency Department Provider Note MRN:  [id number]  Arrival date & time: 01/02/23     Chief Complaint   Emesis   History of Present Illness   Lori [name] is a 52 y.o. year-old female  with PMH of sinus tachycardia, hyperthyroidism, CVA with residual left-sided hemiparesis/spasticity and chronic dysphagia, insulin-dependent DM type II, chronic diastolic heart failure with preserved EF, essential hypertension, generalized anxiety disorder, history of CAD, depression, pheochromocytoma status post unilateral adenectomy in 2018.  Patient presents to the ED with chief complaint of vomiting.  Multiple recent admissions for AKI, gastroparesis, and UTI.  States that she had been trying to use zofran, but reports that the zofran doesn't normally work for her.  She denies having pain, but states that she can't keep anything down.  She denies any fevers or chills.  Recent note from PCP states that she had been prescribed macrobid, but patient discontinued this due to it making her feel bad.  No additional antibiotic started by PCP due to patient not having any UTI symptoms.   History provided by patient.   Review of Systems  Pertinent positive and negative review of systems noted in HPI.    Physical Exam   Vitals:   01/02/23 0506 01/02/23 0523  BP: (!) 157/105   Pulse: (!) 134 (!) 145  Resp: (!) 27 (!) 33  Temp:    SpO2: 96% 97%    CONSTITUTIONAL:  chronically ill-appearing, NAD NEURO:  Alert and oriented x 3, CN 3-12 grossly intact, chronically slurred speech from prior stroke EYES:  eyes equal and reactive ENT/NECK:  Supple, no stridor  CARDIO:  tachycardic, regular rhythm, appears well-perfused  PULM:  No respiratory distress, CTAB GI/GU:  non-distended, no focal tenderness MSK/SPINE:  decreased strength in left upper and lower extremities, right hand contracture SKIN:  no rash, atraumatic   *Additional and/or  pertinent findings included in MDM below  Diagnostic and Interventional Summary    EKG Interpretation Date/Time:  Thursday January 02 2023 04:33:43 EST Ventricular Rate:  134 PR Interval:  108 QRS Duration:  88 QT Interval:  340 QTC Calculation: 508 R Axis:   109  Text Interpretation: Sinus tachycardia Right axis deviation Abnormal T, consider ischemia, diffuse leads When compared with ECG of 12/25/2022, HEART RATE has increased Right axis deviation is now present Nonspecific T wave abnormality is now present - possibly rate-related Confirmed by Dione Booze ([id number]) on 01/02/2023 4:46:56 AM       Labs Reviewed  COMPREHENSIVE METABOLIC PANEL - Abnormal; Notable for the following components:      Result Value   Sodium 130 (*)    Potassium 3.3 (*)    Chloride 97 (*)    CO2 20 (*)    Glucose, Bld 235 (*)    Calcium 8.5 (*)    AST 13 (*)    All other components within normal limits  CBC WITH DIFFERENTIAL/PLATELET - Abnormal; Notable for the following components:   WBC 12.3 (*)    Hemoglobin 10.9 (*)    HCT 33.7 (*)    MCV 78.2 (*)    MCH 25.3 (*)    RDW 15.6 (*)    Platelets 440 (*)    Neutro Abs 8.9 (*)    All other components within normal limits  LIPASE, BLOOD  URINALYSIS, ROUTINE W REFLEX MICROSCOPIC  LACTIC ACID, PLASMA  LACTIC ACID, PLASMA    No orders to display    Medications  promethazine (PHENERGAN) 12.5 mg  in sodium chloride 0.9 % 50 mL IVPB (12.5 mg Intravenous New Bag/Given 01/02/23 0604)  lactated ringers bolus 1,000 mL (0 mLs Intravenous Stopped 01/02/23 0555)  metoCLOPramide (REGLAN) injection 10 mg (10 mg Intravenous Given 01/02/23 0513)  lactated ringers bolus 1,000 mL (1,000 mLs Intravenous New Bag/Given 01/02/23 0559)     Procedures  /  Critical Care Procedures  ED Course and Medical Decision Making  I have reviewed the triage vital signs, the nursing notes, and pertinent available records from the EMR.  Social Determinants Affecting  Complexity of Care: Patient has no clinically significant social determinants affecting this chief complaint..   ED Course: Clinical Course as of 01/02/23 0620  Thu Jan 02, 2023  0619 Comprehensive metabolic panel(!) No AKI today, but hyponatremic, hypokalemic, and low chloride. [RB]    Clinical Course User Index [RB] Roxy Horseman, PA-C    Medical Decision Making Patient here with recurrent nausea and vomiting.  Recent admission for the same.    Still vomiting despite fluids, reglan, and phenergan.     Amount and/or Complexity of Data Reviewed Labs: ordered.  Risk Prescription drug management. Decision regarding hospitalization.         Consultants: I consulted with Hospitalist, Dr. Antionette Char, who is appreciated for admitting.   Treatment and Plan: Patient's exam and diagnostic results are concerning for intractable nausea and vomiting.  Feel that patient will need admission to the hospital for further treatment and evaluation.    Final Clinical Impressions(s) / ED Diagnoses     ICD-10-CM   1. Intractable nausea and vomiting  R11.2     2. Tachycardia  R00.0       ED Discharge Orders     None         Discharge Instructions Discussed with and Provided to Patient:   Discharge Instructions   None      Roxy Horseman, PA-C 01/02/23 2956    Dione Booze, MD 01/10/23 2245

## 2023-01-02 NOTE — ED Triage Notes (Signed)
Pt arrives via [facility] from home for c/o vomiting, pt reported to EMS initially that she was sent home with zofran for nausea/vomiting but that she hadn't taken it because "it doesn't work for me." Pt then later reported that approx. 2200 last night she did take x1 tablet of zofran, then began vomiting approx. 2300. Pt states she has had x5 episodes of vomiting since 2300.   Pt had f/u yesterday with her PCP and was diagnosed with "urinary infection and given diflucan but they told me I couldn't take my cholesterol medicine with it." Pt denies any pain or other c/o at this time excluding nausea/vomiting.

## 2023-01-02 NOTE — ED Notes (Signed)
Rob, PA-C notified that pt continuing to vomit and dry heave at this time. Brown emesis observed on pt right upper gown and BP cuff on pt RUE.

## 2023-01-02 NOTE — H&P (Signed)
History and Physical    Patient: Lori [name]:[id number] DOB: 24-Jun-1970 DOA: 01/02/2023 DOS: the patient was seen and examined on 01/02/2023 PCP: Caesar Bookman, NP  Patient coming from: Home  Chief Complaint:  Chief Complaint  Patient presents with   Emesis   HPI: Lori [name] is a 52 y.o. female with medical history significant of CAD/CABG, depression, anxiety, type 2 diabetes, hyperlipidemia, diabetic gastroparesis, history of DVT, hypertension, pheochromocytoma, hypothyroidism, post ablation hypothyroidism, class II obesity, history of CVA with left-sided spastic hemiparesis who presented to the emergency department with complaints of persistent nausea and emesis.  She was recently admitted from 12/25/2022 until 12/28/2022 with similar symptoms. He denied fever, chills, rhinorrhea, sore throat, wheezing or hemoptysis.  No chest pain, palpitations, diaphoresis, PND, orthopnea or pitting edema of the lower extremities.  No diarrhea, constipation, melena or hematochezia.  No flank pain, dysuria, frequency or hematuria.  No polyuria, polydipsia, polyphagia or blurred vision.   Lab work: CBC Stro[place] glucose of 150 and ketones of 20 mg/deciliter with rare bacteria.  CBC showed a white count of 12.3 with 72% neutrophils, hemoglobin 10.9 g/dL platelets 846.  Normal lipase and lactic acid.  CMP showed a sodium 130, potassium 3.3, chloride 97 and CO2 of 20 mg deciliter with a normal anion gap.  Normal renal function, normal LFTs aside for an AST of 13 units/L and corrected calcium was 8.8 mg/dL.  Imaging: Recent gastric emptying study showed delayed emptying with 67% retention at 60 min and 52% retention at 120 min and 24% retention at 240 minutes (normal retention less than 30% at a 120 min).   ED course: Initial vital signs were temperature 98.3 F, pulse 132, respiration 21, BP 164/98 mmHg O2 sat 98% on room air.  The patient received 2000 mL of LR  bolus and 10 mg of metoclopramide IVP.  Review of Systems: As mentioned in the history of present illness. All other systems reviewed and are negative. Past Medical History:  Diagnosis Date   CAD (coronary artery disease)    Depression    Diabetes mellitus without complication (HCC)    History of CT scan    History of mammogram    History of MRI    Hypertension    Pheochromocytoma    Stroke [facility])    Thyroid disease    Past Surgical History:  Procedure Laterality Date   ABDOMINAL HYSTERECTOMY     CESAREAN SECTION     3   CORONARY ARTERY BYPASS GRAFT     Right adrenal gland removal for pheochromocytoma Right    Social History:  reports that she has never smoked. She has never used smokeless tobacco. She reports that she does not currently use alcohol. She reports that she does not use drugs.  Allergies  Allergen Reactions   Asa [Aspirin] Anaphylaxis, Swelling and Other (See Comments)    Throat closes   Contrast Media [Iodinated Contrast Media] Anaphylaxis and Swelling   Imitrex [Sumatriptan] Anaphylaxis   Ms Contin [Morphine] Anaphylaxis and Swelling   Penicillins Swelling and Other (See Comments)    Mouth and eyes swell   Firvanq [Vancomycin] Other (See Comments)    Red Man's Syndrome   Cipro [Ciprofloxacin Hcl] Nausea And Vomiting   Pork-Derived Products Other (See Comments)    No known allergy but does not want to eat it because of religious values   Nitrofuran Derivatives Anxiety   Wound Dressing Adhesive Itching and Rash    Family History  Problem Relation Age of Onset   Hypertension Mother    Diabetes Mother    Atrial fibrillation Mother    Hypertension Father    Heart attack Father    Dementia Father    Diabetes Brother    Brain cancer Brother    Asthma Daughter    Sickle cell anemia Son    Atrial fibrillation Maternal Uncle     Prior to Admission medications   Medication Sig Start Date End Date Taking? Authorizing Provider  albuterol (PROVENTIL) (2.5  MG/3ML) 0.083% nebulizer solution Take 3 mLs (2.5 mg total) by nebulization every 6 (six) hours as needed for wheezing or shortness of breath. 01/01/23   Ngetich, Dinah C, NP  albuterol (VENTOLIN HFA) 108 (90 Base) MCG/ACT inhaler INHALE 2 PUFFS BY MOUTH EVERY 6 HOURS AS NEEDED FOR WHEEZING AND FOR SHORTNESS OF BREATH Strength: 108 (90 Base) MCG/ACT 01/01/23   Ngetich, Dinah C, NP  amLODipine (NORVASC) 5 MG tablet Take 10 mg by mouth in the morning.    [provider]  atorvastatin (LIPITOR) 80 MG tablet Take 1 tablet (80 mg total) by mouth daily. 01/01/23   Ngetich, Dinah C, NP  baclofen (LIORESAL) 10 MG tablet TAKE 1 TABLET BY MOUTH 3 TIMES DAILY AT 9AM, 1PM AND 5PM FOR SPASTICITY 11/18/22   Lovorn, Aundra Millet, MD  bisacodyl (DULCOLAX) 5 MG EC tablet Take 1 tablet (5 mg total) by mouth daily as needed for moderate constipation. Patient not taking: Reported on 12/27/2022 12/06/22   Jerald Kief, MD  clopidogrel (PLAVIX) 75 MG tablet Take 1 tablet (75 mg total) by mouth daily. 01/01/23   Ngetich, Donalee Citrin, NP  Continuous Glucose Receiver (FREESTYLE LIBRE 2 READER) DEVI 1 Device by Does not apply route daily. E11.69 11/01/22   Ngetich, Dinah C, NP  Continuous Glucose Sensor (FREESTYLE LIBRE 14 DAY SENSOR) MISC 1 each by Does not apply route as needed. DX: E11.69 12/24/22   Ngetich, Dinah C, NP  cyclobenzaprine (FLEXERIL) 5 MG tablet TAKE 1 TABLET BY MOUTH 3 TIMES DAILY AS NEEDED FOR MUSCLE SPASMS 11/18/22   Lovorn, Aundra Millet, MD  diclofenac Sodium (VOLTAREN) 1 % GEL Apply 2 g topically 4 (four) times daily.    [provider]  escitalopram (LEXAPRO) 20 MG tablet Take 1 tablet (20 mg total) by mouth every morning. 01/01/23   Ngetich, Dinah C, NP  ezetimibe (ZETIA) 10 MG tablet Take 1 tablet (10 mg total) by mouth daily. 01/01/23   Ngetich, Dinah C, NP  fluconazole (DIFLUCAN) 200 MG tablet Take one tablet by mouth daily. 12/31/22   Ngetich, Dinah C, NP  gabapentin (NEURONTIN) 100 MG capsule  Take 100 mg by mouth 3 (three) times daily. 11/14/22   [provider]  insulin aspart (NOVOLOG FLEXPEN) 100 UNIT/ML FlexPen Inject 18 Units into the skin 3 (three) times daily with meals.    [provider]  insulin glargine (LANTUS SOLOSTAR) 100 UNIT/ML Solostar Pen Inject 22 Units into the skin daily. Patient taking differently: Inject 20 Units into the skin at bedtime. 12/06/22   Jerald Kief, MD  insulin lispro (HUMALOG) 100 UNIT/ML injection Sliding scale AC, HS CBG 121 - 150: 2 units  CBG 151 - 200: 3 units  CBG 201 - 250: 5 units  CBG 251 - 300: 8 units  CBG 301 - 350: 11 units  CBG 351 - 400: 15 units  CBG > 400: call MD 12/06/22   Jerald Kief, MD  insulin lispro (HUMALOG) 100 UNIT/ML  injection Inject 0.04 mLs (4 Units total) into the skin 3 (three) times daily with meals. 01/01/23   Ngetich, Dinah C, NP  LORazepam (ATIVAN) 0.5 MG tablet Take 1 tablet (0.5 mg total) by mouth at bedtime. 12/06/22   Jerald Kief, MD  methimazole (TAPAZOLE) 5 MG tablet Take 1 tablet (5 mg total) by mouth every morning. 01/01/23   Ngetich, Dinah C, NP  metoCLOPramide (REGLAN) 5 MG tablet Take 1 tablet (5 mg total) by mouth 3 (three) times daily before meals. 12/28/22 01/27/23  Hughie Closs, MD  metoprolol tartrate (LOPRESSOR) 100 MG tablet Take 100 mg by mouth 2 (two) times daily.    [provider]  nystatin (MYCOSTATIN/NYSTOP) powder Apply 1 Application topically 2 (two) times daily as needed (irritation under breast/skin folds). Patient not taking: Reported on 12/27/2022    [provider]  olmesartan-hydrochlorothiazide (BENICAR HCT) 40-25 MG tablet Take 1 tablet by mouth in the morning.    [provider]  omeprazole (PRILOSEC) 20 MG capsule Take 1 capsule (20 mg total) by mouth daily. 09/02/22   Ngetich, Dinah C, NP  polyethylene glycol powder (GLYCOLAX/MIRALAX) 17 GM/SCOOP powder Take 17 g by mouth daily. Hold for loose stool 01/01/23   Ngetich,  Dinah C, NP  ranolazine (RANEXA) 500 MG 12 hr tablet Take 1 tablet (500 mg total) by mouth 2 (two) times daily. 10/09/22 01/07/23  [place], Alvira Philips, DO  rivaroxaban (XARELTO) 10 MG TABS tablet Take 1 tablet (10 mg total) by mouth daily. 01/01/23   Ngetich, Dinah C, NP  tamsulosin (FLOMAX) 0.4 MG CAPS capsule Take 0.4 mg by mouth at bedtime. 11/04/22   [provider]  topiramate (TOPAMAX) 25 MG tablet Take 1 tablet (25 mg total) by mouth 2 (two) times daily. 07/10/22   Sherryll Burger, Pratik D, DO  zinc oxide (BALMEX) 11.3 % CREA cream Apply 1 Application topically 2 (two) times daily.    [provider]    Physical Exam: Vitals:   01/02/23 0600 01/02/23 0630 01/02/23 0700 01/02/23 0824  BP: (!) 150/59 (!) 167/98 121/84   Pulse: (!) 136 (!) 121 (!) 126   Resp: (!) 21 (!) 24 (!) 25   Temp:    99.3 F (37.4 C)  TempSrc:    Axillary  SpO2: 97% 97% 95%   Weight:      Height:       Physical Exam Vitals and nursing note reviewed.  Constitutional:      General: She is awake. She is not in acute distress.    Appearance: Normal appearance. She is obese. She is ill-appearing.  HENT:     Head: Normocephalic.     Nose: No rhinorrhea.     Mouth/Throat:     Mouth: Mucous membranes are dry.  Eyes:     General: No scleral icterus.    Pupils: Pupils are equal, round, and reactive to light.  Neck:     Vascular: No JVD.  Cardiovascular:     Rate and Rhythm: Regular rhythm. Tachycardia present.     Heart sounds: S1 normal and S2 normal.  Pulmonary:     Effort: Pulmonary effort is normal.     Breath sounds: Normal breath sounds. No wheezing, rhonchi or rales.  Abdominal:     General: Bowel sounds are normal. There is no distension.     Palpations: Abdomen is soft.     Tenderness: There is abdominal tenderness. There is no right CVA tenderness, left CVA tenderness, guarding or rebound.  Musculoskeletal:     Cervical back: Neck supple.     Right lower leg: No edema.     Left lower leg: No  edema.  Skin:    General: Skin is warm and dry.  Neurological:     General: No focal deficit present.     Mental Status: She is alert and oriented to person, place, and time.  Psychiatric:        Mood and Affect: Mood normal.        Behavior: Behavior normal. Behavior is cooperative.     Data Reviewed:  Results are pending, will review when available. EKG: Vent. rate 134 BPM PR interval 108 ms QRS duration 88 ms QT/QTcB 340/508 ms P-R-T axes 64 109 -51 Sinus tachycardia Right axis deviation Abnormal T, consider ischemia, diffuse leads  Assessment and Plan: Principal Problem:   Intractable nausea and vomiting In the setting of:   Diabetic gastroparesis (HCC) Superimposed on:   GERD (gastroesophageal reflux disease) Observation/telemetry. Continue IV fluids. Keep n.p.o. for now. Advance diet as tolerated. Metoclopramide 10 mg IVP every 6 hours. Analgesics as needed. Antiemetics as needed. Keep electrolytes optimized. Pantoprazole 40 mg IVP daily. Follow CBC, CMP in AM. Has not seen GI in the past. -Would benefit from establishing. -Will consult as an inpatient if no improvement.  Active Problems:   Hyponatremia Secondary to GI losses. Continue IV fluids. Follow-up sodium level.    Hypokalemia Replacing. Magnesium was supplemented.    Thrombocytosis In the setting of anemia. Monitor platelet count.    Microcytic anemia Monitor hematocrit and hemoglobin.    History of CVA with spastic paresis (cerebrovascular accident) Supportive care. Continue baclofen 10 mg 3 times daily as needed.    Essential hypertension Holding oral antihypertensives while NPO. Metoprolol 5 mg IVP every 6 hours. Will resume once tolerating oral intake. -Although may hold telmisartan/HCTZ for 24 hours.    History of CAD (coronary artery disease) On metoprolol, clopidogrel, Ranexa, DOAC, atorvastatin and ezetimibe. Will resume once able to tolerate diet.    Chronic  diastolic CHF (congestive heart failure) (HCC) Seems to be volume depleted. Will hold ARB and diuretic for now.    HLD (hyperlipidemia) On atorvastatin 80 mg p.o. daily On ezetimibe 10 mg p.o. daily.    Hyperthyroidism On methimazole 5 mg every morning.    History of DVT (deep vein thrombosis) Resume DOAC once tolerating oral intake.    Depression   Generalized anxiety disorder Once cleared for oral intake: Continue escitalopram 20 mg p.o. daily. Continue lorazepam 0.5 mg p.o. bedtime.    Advance Care Planning:   Code Status: Full Code   Consults:   Family Communication:   Severity of Illness: The appropriate patient status for this patient is OBSERVATION. Observation status is judged to be reasonable and necessary in order to provide the required intensity of service to ensure the patient's safety. The patient's presenting symptoms, physical exam findings, and initial radiographic and laboratory data in the context of their medical condition is felt to place them at decreased risk for further clinical deterioration. Furthermore, it is anticipated that the patient will be medically stable for discharge from the hospital within 2 midnights of admission.   Author: Bobette Mo, MD 01/02/2023 8:37 AM  For on call review [email or web address].   This document was prepared using Dragon voice recognition software and may contain some unintended transcription errors.

## 2023-01-03 ENCOUNTER — Observation Stay (HOSPITAL_COMMUNITY): Payer: 59

## 2023-01-03 DIAGNOSIS — K5900 Constipation, unspecified: Secondary | ICD-10-CM | POA: Diagnosis not present

## 2023-01-03 DIAGNOSIS — E1143 Type 2 diabetes mellitus with diabetic autonomic (poly)neuropathy: Secondary | ICD-10-CM | POA: Diagnosis not present

## 2023-01-03 DIAGNOSIS — K3184 Gastroparesis: Secondary | ICD-10-CM

## 2023-01-03 DIAGNOSIS — I679 Cerebrovascular disease, unspecified: Secondary | ICD-10-CM | POA: Insufficient documentation

## 2023-01-03 LAB — COMPREHENSIVE METABOLIC PANEL
ALT: 16 U/L (ref 0–44)
AST: 13 U/L — ABNORMAL LOW (ref 15–41)
Albumin: 3.8 g/dL (ref 3.5–5.0)
Alkaline Phosphatase: 65 U/L (ref 38–126)
Anion gap: 9 (ref 5–15)
BUN: <5 mg/dL — ABNORMAL LOW (ref 6–20)
CO2: 18 mmol/L — ABNORMAL LOW (ref 22–32)
Calcium: 8.8 mg/dL — ABNORMAL LOW (ref 8.9–10.3)
Chloride: 107 mmol/L (ref 98–111)
Creatinine, Ser: 0.66 mg/dL (ref 0.44–1.00)
GFR, Estimated: >60 mL/min (ref >60)
Glucose, Bld: 282 mg/dL — ABNORMAL HIGH (ref 70–99)
Potassium: 4.6 mmol/L (ref 3.5–5.1)
Sodium: 134 mmol/L — ABNORMAL LOW (ref 135–145)
Total Bilirubin: 0.8 mg/dL (ref <1.2)
Total Protein: 7.7 g/dL (ref 6.5–8.1)

## 2023-01-03 LAB — CBC
HCT: 35.3 % — ABNORMAL LOW (ref 36.0–46.0)
Hemoglobin: 11.6 g/dL — ABNORMAL LOW (ref 12.0–15.0)
MCH: 25.4 pg — ABNORMAL LOW (ref 26.0–34.0)
MCHC: 32.9 g/dL (ref 30.0–36.0)
MCV: 77.2 fL — ABNORMAL LOW (ref 80.0–100.0)
Platelets: 462 10*3/uL — ABNORMAL HIGH (ref 150–400)
RBC: 4.57 MIL/uL (ref 3.87–5.11)
RDW: 15.8 % — ABNORMAL HIGH (ref 11.5–15.5)
WBC: 9.1 10*3/uL (ref 4.0–10.5)
nRBC: 0 % (ref 0.0–0.2)

## 2023-01-03 LAB — GLUCOSE, CAPILLARY
Glucose-Capillary: 191 mg/dL — ABNORMAL HIGH (ref 70–99)
Glucose-Capillary: 279 mg/dL — ABNORMAL HIGH (ref 70–99)
Glucose-Capillary: 204 mg/dL — ABNORMAL HIGH (ref 70–99)
Glucose-Capillary: 189 mg/dL — ABNORMAL HIGH (ref 70–99)

## 2023-01-03 MED ORDER — ATORVASTATIN CALCIUM 40 MG PO TABS
80.0000 mg | ORAL_TABLET | Freq: Every day | ORAL | Status: DC
  Administered 2023-01-04: 80 mg via ORAL
  Filled 2023-01-03: qty 2

## 2023-01-03 MED ORDER — TAMSULOSIN HCL 0.4 MG PO CAPS
0.4000 mg | ORAL_CAPSULE | Freq: Every day | ORAL | Status: DC
  Administered 2023-01-03: 0.4 mg via ORAL
  Filled 2023-01-03: qty 1

## 2023-01-03 MED ORDER — BACLOFEN 10 MG PO TABS
10.0000 mg | ORAL_TABLET | Freq: Three times a day (TID) | ORAL | Status: DC
  Administered 2023-01-03 – 2023-01-04 (×3): 10 mg via ORAL
  Filled 2023-01-03 (×3): qty 1

## 2023-01-03 MED ORDER — POLYETHYLENE GLYCOL 3350 17 G PO PACK
17.0000 g | PACK | Freq: Two times a day (BID) | ORAL | Status: DC | PRN
  Administered 2023-01-03 – 2023-01-04 (×2): 17 g via ORAL
  Filled 2023-01-03 (×2): qty 1

## 2023-01-03 MED ORDER — METOCLOPRAMIDE HCL 10 MG PO TABS
10.0000 mg | ORAL_TABLET | Freq: Three times a day (TID) | ORAL | Status: DC
  Administered 2023-01-03 – 2023-01-04 (×4): 10 mg via ORAL
  Filled 2023-01-03 (×4): qty 1

## 2023-01-03 MED ORDER — METHIMAZOLE 5 MG PO TABS
5.0000 mg | ORAL_TABLET | Freq: Every morning | ORAL | Status: DC
  Administered 2023-01-03 – 2023-01-04 (×2): 5 mg via ORAL
  Filled 2023-01-03 (×3): qty 1

## 2023-01-03 MED ORDER — LORAZEPAM 0.5 MG PO TABS
0.5000 mg | ORAL_TABLET | Freq: Every day | ORAL | Status: DC
  Administered 2023-01-03: 0.5 mg via ORAL
  Filled 2023-01-03: qty 1

## 2023-01-03 MED ORDER — RANOLAZINE ER 500 MG PO TB12
500.0000 mg | ORAL_TABLET | Freq: Two times a day (BID) | ORAL | Status: DC
  Administered 2023-01-03 – 2023-01-04 (×3): 500 mg via ORAL
  Filled 2023-01-03 (×4): qty 1

## 2023-01-03 MED ORDER — AMLODIPINE BESYLATE 10 MG PO TABS
10.0000 mg | ORAL_TABLET | Freq: Every day | ORAL | Status: DC
  Administered 2023-01-03 – 2023-01-04 (×2): 10 mg via ORAL
  Filled 2023-01-03 (×2): qty 1

## 2023-01-03 MED ORDER — INSULIN ASPART 100 UNIT/ML IJ SOLN: 0.0000 [IU] | Freq: Every day | INTRAMUSCULAR | Status: DC

## 2023-01-03 MED ORDER — ESCITALOPRAM OXALATE 10 MG PO TABS
10.0000 mg | ORAL_TABLET | Freq: Every morning | ORAL | Status: DC
  Administered 2023-01-03 – 2023-01-04 (×2): 10 mg via ORAL
  Filled 2023-01-03 (×2): qty 1

## 2023-01-03 MED ORDER — IRBESARTAN 150 MG PO TABS
300.0000 mg | ORAL_TABLET | Freq: Every day | ORAL | Status: DC
  Administered 2023-01-04: 300 mg via ORAL
  Filled 2023-01-03: qty 2

## 2023-01-03 MED ORDER — RIVAROXABAN 10 MG PO TABS
10.0000 mg | ORAL_TABLET | Freq: Every day | ORAL | Status: DC
  Administered 2023-01-03 – 2023-01-04 (×2): 10 mg via ORAL
  Filled 2023-01-03 (×2): qty 1

## 2023-01-03 MED ORDER — OLMESARTAN MEDOXOMIL-HCTZ 40-25 MG PO TABS: 1.0000 | ORAL_TABLET | Freq: Every morning | ORAL | Status: DC

## 2023-01-03 MED ORDER — METOPROLOL TARTRATE 50 MG PO TABS
100.0000 mg | ORAL_TABLET | Freq: Two times a day (BID) | ORAL | Status: DC
  Administered 2023-01-03 – 2023-01-04 (×2): 100 mg via ORAL
  Filled 2023-01-03 (×2): qty 2

## 2023-01-03 MED ORDER — CLOPIDOGREL BISULFATE 75 MG PO TABS
75.0000 mg | ORAL_TABLET | Freq: Every day | ORAL | Status: DC
  Administered 2023-01-04: 75 mg via ORAL
  Filled 2023-01-03: qty 1

## 2023-01-03 MED ORDER — CYCLOBENZAPRINE HCL 5 MG PO TABS: 5.0000 mg | ORAL_TABLET | Freq: Three times a day (TID) | ORAL | Status: DC | PRN

## 2023-01-03 MED ORDER — INSULIN GLARGINE-YFGN 100 UNIT/ML ~~LOC~~ SOLN
20.0000 [IU] | Freq: Every day | SUBCUTANEOUS | Status: DC
  Administered 2023-01-03: 20 [IU] via SUBCUTANEOUS
  Filled 2023-01-03 (×2): qty 0.2

## 2023-01-03 MED ORDER — TOPIRAMATE 25 MG PO TABS
25.0000 mg | ORAL_TABLET | Freq: Two times a day (BID) | ORAL | Status: DC
  Administered 2023-01-03 – 2023-01-04 (×3): 25 mg via ORAL
  Filled 2023-01-03 (×3): qty 1

## 2023-01-03 MED ORDER — INSULIN ASPART 100 UNIT/ML IJ SOLN
0.0000 [IU] | Freq: Three times a day (TID) | INTRAMUSCULAR | Status: DC
Start: 2023-01-03 — End: 2023-01-04
  Administered 2023-01-03: 8 [IU] via SUBCUTANEOUS
  Administered 2023-01-03 – 2023-01-04 (×3): 5 [IU] via SUBCUTANEOUS

## 2023-01-03 MED ORDER — GABAPENTIN 100 MG PO CAPS
100.0000 mg | ORAL_CAPSULE | Freq: Three times a day (TID) | ORAL | Status: DC
  Administered 2023-01-03 – 2023-01-04 (×3): 100 mg via ORAL
  Filled 2023-01-03 (×3): qty 1

## 2023-01-03 MED ORDER — HYDROCHLOROTHIAZIDE 25 MG PO TABS
25.0000 mg | ORAL_TABLET | Freq: Every day | ORAL | Status: DC
  Administered 2023-01-04: 25 mg via ORAL
  Filled 2023-01-03: qty 1

## 2023-01-03 NOTE — Assessment & Plan Note (Signed)
Mild, asymptomatic - Stop fluids

## 2023-01-03 NOTE — Assessment & Plan Note (Signed)
Continue baclofen 

## 2023-01-03 NOTE — Assessment & Plan Note (Signed)
Resolved with supplementation and starting spironolactone. 

## 2023-01-03 NOTE — Assessment & Plan Note (Signed)
BP elevated - Resume amlodipine, hydrochlorothiazide, ARB

## 2023-01-03 NOTE — Assessment & Plan Note (Addendum)
-   Resume Plavix, Ranolazine, Xarelto, BB, Irbesartan, Lipitor

## 2023-01-03 NOTE — Assessment & Plan Note (Signed)
Hyperglycemia this afternoon - Continue glargine - Continue SS corrections

## 2023-01-03 NOTE — Care Management Obs Status (Signed)
MEDICARE OBSERVATION STATUS NOTIFICATION   Patient Details  Name: Lori [name] MRN: [id number] Date of Birth: 1970-07-12   Medicare Observation Status Notification Given:  Yes    Otelia Santee, LCSW 01/03/2023, 10:47 AM

## 2023-01-03 NOTE — TOC Initial Note (Addendum)
Transition of Care [facility]) - Initial/Assessment Note    Patient Details  Name: Lori [name] MRN: [id number] Date of Birth: [birth year]  Transition of Care [facility]) CM/SW Contact:    Otelia Santee, LCSW Phone Number: 01/03/2023, 11:46 AM  Clinical Narrative:                 Pt from home w/ spouse. Pt currently active with [facility] for HHPT/OT. ROC orders will need to be placed prior to discharge. Pt has wheel chair, BSC, RW, shower chair at home and has no further DME needs. Pt will require PTAR for transportation home.   ADDENDUM: Pt states she does not want to return home but, knows she will have to at discharge. Pt is interested in seeking LTC placement. Pt is agreeable to having home health social work arranged to assist with this. Pt will need HH orders for HHPT/OT/SW at discharge.    Expected Discharge Plan: Home w Home Health Services Barriers to Discharge: No Barriers Identified   Patient Goals and CMS Choice Patient states their goals for this hospitalization and ongoing recovery are:: To return home CMS Medicare.gov Compare Post Acute Care list provided to:: Patient Choice offered to / list presented to : Patient [place] ownership interest in [facility].provided to::  (NA)    Expected Discharge Plan and Services In-house Referral: Clinical Social Work Discharge Planning Services: NA Post Acute Care Choice: Resumption of Svcs/PTA Provider, Home Health Living arrangements for the past 2 months: Apartment                 DME Arranged: N/A DME Agency: NA                  Prior Living Arrangements/Services Living arrangements for the past 2 months: Apartment Lives with:: Spouse Patient language and need for interpreter reviewed:: Yes Do you feel safe going back to the place where you live?: Yes      Need for Family Participation in Patient Care: Yes (Comment) Care giver support system in place?: Yes (comment) Current home  services: DME, Home OT, Home PT (wheel chair, BSC, shower chair; HHPT/OT w/ [facility]) Criminal Activity/Legal Involvement Pertinent to Current Situation/Hospitalization: No - Comment as needed  Activities of Daily Living   ADL Screening (condition at time of admission) Independently performs ADLs?: No Does the patient have a NEW difficulty with bathing/dressing/toileting/self-feeding that is expected to last >3 days?: No Does the patient have a NEW difficulty with getting in/out of bed, walking, or climbing stairs that is expected to last >3 days?: No Does the patient have a NEW difficulty with communication that is expected to last >3 days?: No Is the patient deaf or have difficulty hearing?: No Does the patient have difficulty seeing, even when wearing glasses/contacts?: No Does the patient have difficulty concentrating, remembering, or making decisions?: No  Permission Sought/Granted Permission sought to share information with : Facility Industrial/product designer granted to share information with : Yes, Verbal Permission Granted     Permission granted to share info w AGENCY: [facility]        Emotional Assessment Appearance:: Appears stated age Attitude/Demeanor/Rapport: Engaged Affect (typically observed): Accepting, Pleasant Orientation: : Oriented to Self, Oriented to Place, Oriented to  Time, Oriented to Situation Alcohol / Substance Use: Not Applicable Psych Involvement: No (comment)  Admission diagnosis:  Tachycardia [R00.0] Intractable nausea and vomiting [R11.2] Patient Active Problem List   Diagnosis Date Noted   Intractable nausea and vomiting 01/02/2023  Hypokalemia 01/02/2023   Thrombocytosis 01/02/2023   Microcytic anemia 01/02/2023   Obstructive sleep apnea 01/02/2023   Acute cystitis 12/26/2022   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 12/25/2022   History of DVT (deep vein thrombosis) 12/25/2022   S/P total hysterectomy 12/24/2022   Delirium 11/22/2022   Dehydration  11/22/2022   Acute encephalopathy 11/04/2022   Chronic anticoagulation 11/04/2022   Hypotension 11/04/2022   Acute metabolic encephalopathy 11/04/2022   Morbid obesity (HCC) 10/13/2022   Aspiration pneumonia (HCC) 10/13/2022   Chronic diastolic CHF (congestive heart failure) (HCC) 10/13/2022   Chronic anemia 10/13/2022   Acute pancreatitis 10/11/2022   Chest pain of uncertain etiology 10/06/2022   History of CAD (coronary artery disease) 10/06/2022   Generalized anxiety disorder 10/06/2022   Reactive airway disease 10/06/2022   UTI (urinary tract infection) 07/04/2022   Mild intermittent asthma, uncomplicated 07/04/2022   Lethargic 05/01/2022   Hyponatremia 04/30/2022   Hyperkalemia 04/30/2022   AKI (acute kidney injury) (HCC) 04/30/2022   Wheelchair dependence 04/08/2022   Spasticity as late effect of cerebrovascular accident (CVA) 04/08/2022   Left hemiplegia (HCC) 04/08/2022   Dyspnea 11/02/2021   GERD (gastroesophageal reflux disease) 11/02/2021   Nausea and vomiting 08/25/2021   Acute bronchitis 08/24/2021   Hypothyroidism 07/10/2021   Functional urinary incontinence 05/17/2021   MDD (major depressive disorder), recurrent episode, severe (HCC) 04/09/2021   Diabetic gastroparesis (HCC) 03/16/2021   Migraines 03/16/2021   History of CVA with residual deficit    Debility 02/26/2021   DKA (diabetic ketoacidosis) (HCC) 02/19/2021   Weakness    Insulin dependent type 2 diabetes mellitus (HCC)    Aphasia    Spell of abnormal behavior    History of CVA with spastic paresis (cerebrovascular accident) 01/19/2021   Tachycardia 01/19/2021   CAD (coronary artery disease) 01/19/2021   Spastic hemiparesis of left 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 (HCC) 01/19/2021   Hyperthyroidism    Essential hypertension    Pheochromocytoma    Depression    HLD (hyperlipidemia)    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 [place] [address] Phone: [phone] Fax: [phone]     Social Determinants of Health (SDOH) Social History: SDOH Screenings   Food Insecurity: No Food Insecurity (01/02/2023)  Housing: Patient Declined (01/02/2023)  Transportation Needs: No Transportation Needs (01/02/2023)  Utilities: Not At Risk (01/02/2023)  Depression (PHQ2-9): Low Risk  (12/24/2022)  Recent Concern: Depression (PHQ2-9) - Medium Risk (10/28/2022)  Tobacco Use: Low Risk  (01/02/2023)   SDOH Interventions:     Readmission Risk Interventions    12/28/2022   11:30 AM 11/05/2022    1:38 PM 10/15/2022   10:25 AM  Readmission Risk Prevention Plan  Transportation Screening Complete Complete Complete  Medication Review Oceanographer) Complete Complete   PCP or Specialist appointment within 3-5 days of discharge Complete Complete Complete  HRI or Home Care Consult Complete Complete Complete  SW Recovery Care/Counseling Consult Complete Complete Complete  Palliative Care Screening Not Applicable Not Applicable Not Applicable  Skilled Nursing Facility Not Applicable Not Applicable Not  Applicable

## 2023-01-03 NOTE — Assessment & Plan Note (Signed)
-   Repeat TSH, fT4, T3 - Cotinue methimazole

## 2023-01-03 NOTE — Assessment & Plan Note (Signed)
BMI 35.6 in setting of comorbid DM, HLD

## 2023-01-03 NOTE — Assessment & Plan Note (Signed)
Appears euovlemic.  Not on diuretic.

## 2023-01-03 NOTE — Assessment & Plan Note (Signed)
Hgb stable, no clinical bleeding noted

## 2023-01-03 NOTE — Plan of Care (Signed)
  Problem: Education: Goal: Knowledge of General Education information will improve Description: Including pain rating scale, medication(s)/side effects and non-pharmacologic comfort measures Outcome: Progressing   Problem: Clinical Measurements: Goal: Ability to maintain clinical measurements within normal limits will improve Outcome: Progressing Goal: Will remain free from infection Outcome: Progressing Goal: Diagnostic test results will improve Outcome: Progressing Goal: Respiratory complications will improve Outcome: Progressing   Problem: Nutrition: Goal: Adequate nutrition will be maintained Outcome: Progressing   Problem: Pain Management: Goal: General experience of comfort will improve Outcome: Progressing   Problem: Safety: Goal: Ability to remain free from injury will improve Outcome: Progressing   Problem: Education: Goal: Ability to describe self-care measures that may prevent or decrease complications (Diabetes Survival Skills Education) will improve Outcome: Progressing   Problem: Fluid Volume: Goal: Ability to maintain a balanced intake and output will improve Outcome: Progressing

## 2023-01-03 NOTE — Assessment & Plan Note (Signed)
 Continue Topamax.

## 2023-01-03 NOTE — Progress Notes (Signed)
  Progress Note   Patient: Lori [name] ZOX:[id number] DOB: 10/13/1970 DOA: 01/02/2023     0 DOS: the patient was seen and examined on 01/03/2023 at 10:41AM      Brief hospital course: [name] is a 52 y.o. 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  Evidently unable to obtain the Reglan from her pharmacy after discharge on the 23rd.    Then developed recurrent vomiting and was admitted again.     Assessment and Plan: * Diabetic gastroparesis (HCC) With flare Improved overnight with IV Reglan.  Tolerating clears this morning, feeling better. - Stop IV Reglan - Resume PO - ADAT - Start oral Reglan - Small frequent meals, low fat    Chronic diastolic CHF (congestive heart failure) (HCC) Appears euovlemic.  Not on diuretic.  Coronary artery disease with chronic angina - Resume Plavix, Ranolazine, Xarelto, BB, Irbesartan, Lipitor  Left hemiplegia (HCC) - Continue baclofen  Insulin dependent type 2 diabetes mellitus (HCC) Hyperglycemia this afternoon - Continue glargine - Continue SS corrections   Essential hypertension BP elevated - Resume amlodipine, hydrochlorothiazide, ARB  Hyperthyroidism - Repeat TSH, fT4, T3 - Cotinue methimazole  History of CVA with spastic paresis (cerebrovascular accident)    Cerebrovascular disease    Microcytic anemia Hgb stable, no clinical bleeding noted  Hypokalemia - Supplement K  History of DVT (deep vein thrombosis) - Continue Xarelto  Morbid obesity (HCC) BMI 35.6 in setting of comorbid DM, HLD  Generalized anxiety disorder - Continue Lexapro, Ativan  Hyponatremia Mild, asymptomatic - Stop fluids  Migraines - Continue Topamax  HLD  (hyperlipidemia) - Resume Lipitor  Depression - Continue Lexapro, Ativan          Subjective: Patient has not had any vomiting since 2 AM, last night.  No abdominal pain.  No fever, confusion.     Physical Exam: BP (!) 153/106 (BP Location: Right Leg)   Pulse 100   Temp 98.4 F (36.9 C) (Oral)   Resp 16   Ht 5\' 2"  (1.575 m)   Wt 88.5 kg   SpO2 100%   BMI 35.67 kg/m   Adult female, lying in bed, appears weak and tired Tachycardic, regular, no murmurs, no peripheral edema Respiratory rate normal, lungs clear without rales or wheezes Abdomen soft without tenderness palpation, she has voluntary guarding throughout Attention normal, affect anxious, judgment and insight appear at baseline, oriented to person, place, time, situation She has left-sided hemiparesis with spasticity on the left, she has severe dysarthria, these are both at baseline    Data Reviewed: CMP shows persistent hyponatremia, renal function stable, mag and Phos normal Abdomen x-ray shows no bowel obstruction or ileus, minimal retained stool CBC unremarkable Glucose is elevated  Family Communication: None present    Disposition: Status is: Observation The patient is improving but given her frequent readmissions for the same, I feel that it would be too high risk for readmission to discharge today without monitoring her 24 hours on oral Reglan        Author: Alberteen Sam, MD 01/03/2023 3:45 PM  For on call review [email or web address].

## 2023-01-03 NOTE — Assessment & Plan Note (Signed)
 Continue Lexapro, Ativan

## 2023-01-03 NOTE — Assessment & Plan Note (Signed)
With flare Improved overnight with IV Reglan.  Tolerating clears this morning, feeling better. - Stop IV Reglan - Resume PO - ADAT - Start oral Reglan - Small frequent meals, low fat

## 2023-01-03 NOTE — Assessment & Plan Note (Signed)
 Resume Lipitor

## 2023-01-03 NOTE — Inpatient Diabetes Management (Signed)
Inpatient Diabetes Program Recommendations  AACE/ADA: New Consensus Statement on Inpatient Glycemic Control   Target Ranges:  Prepandial:   less than 140 mg/dL      Peak postprandial:   less than 180 mg/dL (1-2 hours)      Critically ill patients:  140 - 180 mg/dL    Latest Reference Range & Units 01/02/23 13:00 01/02/23 14:19 01/02/23 16:54 01/02/23 23:12 01/03/23 05:30  Glucose-Capillary 70 - 99 mg/dL 161 (H) 096 (H) 045 (H) 181 (H) 191 (H)    Review of Glycemic Control  Diabetes history: DM2 Outpatient Diabetes medications: Lantus 20 units at bedtime, Novolog 18 units TID with meals Current orders for Inpatient glycemic control: None  Inpatient Diabetes Program Recommendations:    Insulin: Please consider ordering CBGs AC&HS, Novolog 0-9 units TID with meals, Novolog 0-5 units at bedtime, and Semglee 8 units Q24H.  Thanks, Orlando Penner, RN, MSN, CDCES Diabetes Coordinator Inpatient Diabetes Program [phone] (Team Pager from 8am to 5pm)

## 2023-01-03 NOTE — Hospital Course (Signed)
Mrs. Lori [name] is a 52 y.o. 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  Evidently unable to obtain the Reglan from her pharmacy after discharge on the 23rd.    Then developed recurrent vomiting and was admitted again.

## 2023-01-03 NOTE — Assessment & Plan Note (Signed)
Continue Xarelto 

## 2023-01-04 ENCOUNTER — Other Ambulatory Visit (HOSPITAL_COMMUNITY): Payer: Self-pay

## 2023-01-04 DIAGNOSIS — Z7401 Bed confinement status: Secondary | ICD-10-CM | POA: Diagnosis not present

## 2023-01-04 DIAGNOSIS — E1143 Type 2 diabetes mellitus with diabetic autonomic (poly)neuropathy: Secondary | ICD-10-CM | POA: Diagnosis not present

## 2023-01-04 DIAGNOSIS — K3184 Gastroparesis: Secondary | ICD-10-CM | POA: Diagnosis not present

## 2023-01-04 DIAGNOSIS — Z743 Need for continuous supervision: Secondary | ICD-10-CM | POA: Diagnosis not present

## 2023-01-04 LAB — COMPREHENSIVE METABOLIC PANEL
ALT: 12 U/L (ref 0–44)
AST: 13 U/L — ABNORMAL LOW (ref 15–41)
Albumin: 3.3 g/dL — ABNORMAL LOW (ref 3.5–5.0)
Alkaline Phosphatase: 59 U/L (ref 38–126)
Anion gap: 10 (ref 5–15)
BUN: <5 mg/dL — ABNORMAL LOW (ref 6–20)
CO2: 20 mmol/L — ABNORMAL LOW (ref 22–32)
Calcium: 8.6 mg/dL — ABNORMAL LOW (ref 8.9–10.3)
Chloride: 104 mmol/L (ref 98–111)
Creatinine, Ser: 0.75 mg/dL (ref 0.44–1.00)
GFR, Estimated: >60 mL/min (ref >60)
Glucose, Bld: 208 mg/dL — ABNORMAL HIGH (ref 70–99)
Potassium: 4 mmol/L (ref 3.5–5.1)
Sodium: 134 mmol/L — ABNORMAL LOW (ref 135–145)
Total Bilirubin: 0.7 mg/dL (ref <1.2)
Total Protein: 6.8 g/dL (ref 6.5–8.1)

## 2023-01-04 LAB — CBC
HCT: 31.6 % — ABNORMAL LOW (ref 36.0–46.0)
Hemoglobin: 9.9 g/dL — ABNORMAL LOW (ref 12.0–15.0)
MCH: 24.9 pg — ABNORMAL LOW (ref 26.0–34.0)
MCHC: 31.3 g/dL (ref 30.0–36.0)
MCV: 79.4 fL — ABNORMAL LOW (ref 80.0–100.0)
Platelets: 342 10*3/uL (ref 150–400)
RBC: 3.98 MIL/uL (ref 3.87–5.11)
RDW: 15.7 % — ABNORMAL HIGH (ref 11.5–15.5)
WBC: 8.2 10*3/uL (ref 4.0–10.5)
nRBC: 0 % (ref 0.0–0.2)

## 2023-01-04 LAB — TSH: TSH: 0.958 u[IU]/mL (ref 0.350–4.500)

## 2023-01-04 LAB — GLUCOSE, CAPILLARY
Glucose-Capillary: 205 mg/dL — ABNORMAL HIGH (ref 70–99)
Glucose-Capillary: 237 mg/dL — ABNORMAL HIGH (ref 70–99)

## 2023-01-04 LAB — T4, FREE: Free T4: 1.16 ng/dL — ABNORMAL HIGH (ref 0.61–1.12)

## 2023-01-04 MED ORDER — METOCLOPRAMIDE HCL 5 MG PO TABS
5.0000 mg | ORAL_TABLET | Freq: Three times a day (TID) | ORAL | 2 refills | Status: DC
  Filled 2023-01-04: qty 90, 30d supply, fill #0

## 2023-01-04 MED ORDER — BISACODYL 10 MG RE SUPP
10.0000 mg | Freq: Once | RECTAL | Status: AC
  Administered 2023-01-04: 10 mg via RECTAL
  Filled 2023-01-04: qty 1

## 2023-01-04 NOTE — Plan of Care (Signed)

## 2023-01-04 NOTE — Discharge Summary (Signed)
Physician Discharge Summary   Patient: Lori [name] MRN: [id number] DOB: 11/25/1970  Admit date:     01/02/2023  Discharge date: 01/04/23  Discharge Physician: Alberteen Sam   PCP: Caesar Bookman, NP     Recommendations at discharge:  Follow up with PCP Dinah Ngetich in 1 week for gastroparesis flare Dinah Ngetich:  Please refer to GI for gastroparesis     Discharge Diagnoses: Principal Problem:   Diabetic gastroparesis (HCC) Active Problems:   History of CVA with spastic paresis (cerebrovascular accident)   Hyperthyroidism   Essential hypertension   Insulin dependent type 2 diabetes mellitus (HCC)   Left hemiplegia (HCC)   Coronary artery disease with chronic angina   Chronic diastolic CHF (congestive heart failure) (HCC)   Depression   HLD (hyperlipidemia)   Migraines   Hyponatremia   Generalized anxiety disorder   Morbid obesity (HCC)   History of DVT (deep vein thrombosis)   Hypokalemia   Thrombocytosis   Microcytic anemia   Cerebrovascular disease      Hospital Course: [name] is a 52 y.o. 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  Evidently unable to obtain the Reglan from her pharmacy after discharge on the 23rd.    Then developed recurrent vomiting and was admitted again.      * Diabetic gastroparesis with flare Evidently unable to fill her medicine at last discharge.  Improved probably with IV Reglan.  Switch to oral Reglan, observe 24 hours and did well, oral intake was good.  Recommend scheduled Reglan, small frequent meals  Recommend GI referral, titrate off Reglan as able.    Chronic diastolic CHF (congestive heart failure) (HCC) Appears euovlemic.  Not on  diuretic.  Coronary artery disease with chronic angina Cerebrovascular disease Asymptomatic on Plavix, Ranolazine, Xarelto, BB, Irbesartan, Lipitor  Left hemiplegia (HCC) Stable on baclofen  Insulin dependent type 2 diabetes mellitus (HCC) Glucoses mostly controlled here on glargine and sliding scale corrections  Essential hypertension Controlled on amlodipine, hydrochlorothiazide, ARB, metoprolol  Hyperthyroidism TSH normal, free T4 slightly elevated.  Stable on methimazole.  Asymptomatic.              [facility] Controlled Substances Registry was reviewed for this patient prior to discharge.  Consultants: None Procedures performed: None  Disposition: Home health   DISCHARGE MEDICATION: Allergies as of 01/04/2023       Reactions   Asa [aspirin] Anaphylaxis, Swelling, Other (See Comments)   Throat closes   Contrast Media [iodinated Contrast Media] Anaphylaxis, Swelling   Imitrex [sumatriptan] Anaphylaxis   Ms Contin [morphine] Anaphylaxis, Swelling   Penicillins Swelling, Other (See Comments)   Mouth and eyes swell   Firvanq [vancomycin] Other (See Comments)   Red Man's Syndrome   Cipro [ciprofloxacin Hcl] Nausea And Vomiting   Pork-derived Products Other (See Comments)   No known allergy but does not want to eat it because of religious values   Nitrofuran Derivatives Anxiety   Wound Dressing Adhesive Itching, Rash        Medication List     STOP taking these medications    fluconazole 200 MG tablet Commonly known as: DIFLUCAN   NovoLOG FlexPen 100 UNIT/ML FlexPen Generic drug: insulin aspart   nystatin powder Commonly known as: MYCOSTATIN/NYSTOP       TAKE  these medications    albuterol 108 (90 Base) MCG/ACT inhaler Commonly known as: VENTOLIN HFA INHALE 2 PUFFS BY MOUTH EVERY 6 HOURS AS NEEDED FOR WHEEZING AND FOR SHORTNESS OF BREATH Strength: 108 (90 Base) MCG/ACT What changed:  how much to take how to take this when to  take this reasons to take this additional instructions   albuterol (2.5 MG/3ML) 0.083% nebulizer solution Commonly known as: PROVENTIL Take 3 mLs (2.5 mg total) by nebulization every 6 (six) hours as needed for wheezing or shortness of breath. What changed: Another medication with the same name was changed. Make sure you understand how and when to take each.   amLODipine 5 MG tablet Commonly known as: NORVASC Take 10 mg by mouth in the morning.   atorvastatin 80 MG tablet Commonly known as: LIPITOR Take 1 tablet (80 mg total) by mouth daily.   baclofen 10 MG tablet Commonly known as: LIORESAL TAKE 1 TABLET BY MOUTH 3 TIMES DAILY AT 9AM, 1PM AND 5PM FOR SPASTICITY What changed: See the new instructions.   bisacodyl 5 MG EC tablet Commonly known as: DULCOLAX Take 1 tablet (5 mg total) by mouth daily as needed for moderate constipation.   clopidogrel 75 MG tablet Commonly known as: PLAVIX Take 1 tablet (75 mg total) by mouth daily.   cyclobenzaprine 5 MG tablet Commonly known as: FLEXERIL TAKE 1 TABLET BY MOUTH 3 TIMES DAILY AS NEEDED FOR MUSCLE SPASMS   escitalopram 20 MG tablet Commonly known as: LEXAPRO Take 1 tablet (20 mg total) by mouth every morning.   ezetimibe 10 MG tablet Commonly known as: Zetia Take 1 tablet (10 mg total) by mouth daily.   FreeStyle Libre 14 Day Sensor Misc 1 each by Does not apply route as needed. DX: E11.69   FreeStyle Libre 2 Reader Devi 1 Device by Does not apply route daily. E11.69   gabapentin 100 MG capsule Commonly known as: NEURONTIN Take 100 mg by mouth 3 (three) times daily.   insulin lispro 100 UNIT/ML injection Commonly known as: HUMALOG Sliding scale AC, HS CBG 121 - 150: 2 units  CBG 151 - 200: 3 units  CBG 201 - 250: 5 units  CBG 251 - 300: 8 units  CBG 301 - 350: 11 units  CBG 351 - 400: 15 units  CBG > 400: call MD   insulin lispro 100 UNIT/ML injection Commonly known as: HUMALOG Inject 0.04 mLs (4 Units  total) into the skin 3 (three) times daily with meals.   Lantus SoloStar 100 UNIT/ML Solostar Pen Generic drug: insulin glargine Inject 22 Units into the skin daily. What changed:  how much to take when to take this   LORazepam 0.5 MG tablet Commonly known as: ATIVAN Take 1 tablet (0.5 mg total) by mouth at bedtime.   methimazole 5 MG tablet Commonly known as: TAPAZOLE Take 1 tablet (5 mg total) by mouth every morning.   metoCLOPramide 5 MG tablet Commonly known as: REGLAN Take 1 tablet (5 mg total) by mouth 3 (three) times daily before meals.   metoprolol tartrate 100 MG tablet Commonly known as: LOPRESSOR Take 100 mg by mouth 2 (two) times daily.   olmesartan-hydrochlorothiazide 40-25 MG tablet Commonly known as: BENICAR HCT Take 1 tablet by mouth in the morning.   omeprazole 20 MG capsule Commonly known as: PRILOSEC Take 1 capsule (20 mg total) by mouth daily.   polyethylene glycol powder 17 GM/SCOOP powder Commonly known as: GLYCOLAX/MIRALAX Take 17 g by mouth daily. Hold  for loose stool   ranolazine 500 MG 12 hr tablet Commonly known as: RANEXA Take 1 tablet (500 mg total) by mouth 2 (two) times daily.   rivaroxaban 10 MG Tabs tablet Commonly known as: XARELTO Take 1 tablet (10 mg total) by mouth daily.   tamsulosin 0.4 MG Caps capsule Commonly known as: FLOMAX Take 0.4 mg by mouth at bedtime.   topiramate 25 MG tablet Commonly known as: TOPAMAX Take 1 tablet (25 mg total) by mouth 2 (two) times daily.   Voltaren 1 % Gel Generic drug: diclofenac Sodium Apply 2 g topically 4 (four) times daily.   zinc oxide 11.3 % Crea cream Commonly known as: BALMEX Apply 1 Application topically 2 (two) times daily.        Follow-up Information     Ngetich, Dinah C, NP. Schedule an appointment as soon as possible for a visit in 1 week(s).   Specialty: Family Medicine Contact information: [address] [place] [address] [phone]                  Discharge Instructions     Discharge instructions   Complete by: As directed    **IMPORTANT DISCHARGE INSTRUCTIONS**   From Dr. Maryfrances Bunnell: You were admitted for gastroparesis  "Gastroparesis" is a condition that happens when the small nerves that control the natural movement of the stomach fail because of diabetes.  The stomach, as you know, is a very active organ, that has to MOVE periodically to push food through the digestive system  The nerves that control this get damaged by high blood sugar, and when they get damaged, people will have episodes where the nerves stop, and then they get very nauseated and a lot of pain  The treatment is nausea medicine, especially ones like metoclopramide/Reglan that INCREASE movement of the stomach and GI tract  Take Reglan 5 mg three times daily  If you still have some mild nausea, INCREASE to 10 mg (two tabs) three times dialy for a few days and call your primary care doctor  IMPORTANT: Eat multiple small meals per day, rather than few LARGE meals Avoid fatty or greasy foods when your stomach is sensitive  Go see your primary doctor in 1 week  Resume all of your other home medicines   Increase activity slowly   Complete by: As directed        Discharge Exam: Filed Weights   01/02/23 0429  Weight: 88.5 kg    General: Pt is alert, awake, not in acute distress, lying in bed Cardiovascular: RRR, nl S1-S2, no murmurs appreciated.   No LE edema.   Respiratory: Normal respiratory rate and rhythm.  CTAB without rales or wheezes. Abdominal: Abdomen soft and non-tender.  No distension or HSM.   Neuro/Psych: left hemiparesis.  Stable dysarthria.  Judgment and insight appear normal.   Condition at discharge: fair  The results of significant diagnostics from this hospitalization (including imaging, microbiology, ancillary and laboratory) are listed below for reference.   Imaging Studies: DG Abd 2 Views  Result Date:  01/03/2023 CLINICAL DATA:  Constipation EXAM: ABDOMEN - 2 VIEW COMPARISON:  11/22/2022 FINDINGS: Two supine frontal views of the abdomen and pelvis are obtained. No bowel obstruction or ileus. Minimal stool throughout the colon. No masses or abnormal calcifications. Lung bases are clear. IMPRESSION: 1. No bowel obstruction or ileus. Minimal retained stool within the colon. Electronically Signed   By: Sharlet Salina M.D.   On: 01/03/2023 12:35   NM  GASTRIC EMPTYING  Result Date: 12/27/2022 CLINICAL DATA:  Gastroparesis EXAM: NUCLEAR MEDICINE GASTRIC EMPTYING SCAN TECHNIQUE: After oral ingestion of radiolabeled meal, sequential abdominal images were obtained for 240 minutes. Residual percentage of activity remaining within the stomach was calculated at 60, 120 and 240 minutes. RADIOPHARMACEUTICALS:  1.92 mCi Tc-47m cephalocaudal Lloyd in egg beaters water COMPARISON:  None Available. FINDINGS: Expected location of the stomach in the left upper quadrant. Ingested meal empties the stomach gradually over the course of the study with 67% retention at 60 min and 52% retention at 120 min and 24% retention at 240 minutes (normal retention less than 30% at a 120 min). IMPRESSION: Delayed gastric emptying. Electronically Signed   By: Karen Kays M.D.   On: 12/27/2022 14:08   [place] EKG SITE RITE  Result Date: 12/26/2022 If Site Rite image not attached, placement could not be confirmed due to current cardiac rhythm.  DG Chest Port 1 View  Result Date: 12/25/2022 CLINICAL DATA:  Chest pain, vomiting, lethargic EXAM: PORTABLE CHEST 1 VIEW COMPARISON:  11/21/2022 FINDINGS: Single frontal view of the chest demonstrates postsurgical changes from median sternotomy. The cardiac silhouette is stable. No acute airspace disease, effusion, or pneumothorax. No acute bony abnormalities. IMPRESSION: 1. No acute intrathoracic process. Electronically Signed   By: Sharlet Salina M.D.   On: 12/25/2022 21:34     Microbiology: Results for orders placed or performed during the hospital encounter of 11/21/22  Blood Culture (routine x 2)     Status: None   Collection Time: 11/22/22 12:19 AM   Specimen: BLOOD RIGHT ARM  Result Value Ref Range Status   Specimen Description BLOOD RIGHT ARM  Final   Special Requests   Final    BOTTLES DRAWN AEROBIC AND ANAEROBIC Blood Culture adequate volume   Culture   Final    NO GROWTH 5 DAYS Performed at [facility], [address], [place], [place] [address]    Report Status 11/27/2022 FINAL  Final  Resp panel by RT-PCR (RSV, Flu A&B, Covid) Anterior Nasal Swab     Status: None   Collection Time: 11/22/22 12:19 AM   Specimen: Anterior Nasal Swab  Result Value Ref Range Status   SARS Coronavirus 2 by RT PCR NEGATIVE NEGATIVE Final   Influenza A by PCR NEGATIVE NEGATIVE Final   Influenza B by PCR NEGATIVE NEGATIVE Final    Comment: (NOTE) The Xpert Xpress SARS-CoV-2/FLU/RSV plus assay is intended as an aid in the diagnosis of influenza from Nasopharyngeal swab specimens and should not be used as a sole basis for treatment. Nasal washings and aspirates are unacceptable for Xpert Xpress SARS-CoV-2/FLU/RSV testing.  Fact Sheet for Patients: BloggerCourse.com  Fact Sheet for Healthcare Providers: SeriousBroker.it  This test is not yet approved or cleared by the [place] FDA and has been authorized for detection and/or diagnosis of SARS-CoV-2 by FDA under an Emergency Use Authorization (EUA). This EUA will remain in effect (meaning this test can be used) for the duration of the COVID-19 declaration under Section 564(b)(1) of the Act, 21 U.S.C. section 360bbb-3(b)(1), unless the authorization is terminated or revoked.     Resp Syncytial Virus by PCR NEGATIVE NEGATIVE Final    Comment: (NOTE) Fact Sheet for Patients: BloggerCourse.com  Fact Sheet for Healthcare  Providers: SeriousBroker.it  This test is not yet approved or cleared by the [place] FDA and has been authorized for detection and/or diagnosis of SARS-CoV-2 by FDA under an Emergency Use Authorization (EUA). This EUA will  remain in effect (meaning this test can be used) for the duration of the COVID-19 declaration under Section 564(b)(1) of the Act, 21 U.S.C. section 360bbb-3(b)(1), unless the authorization is terminated or revoked.  Performed at [facility], [address], [place], [place] [address]   Blood Culture (routine x 2)     Status: None   Collection Time: 11/22/22  1:41 AM   Specimen: BLOOD RIGHT HAND  Result Value Ref Range Status   Specimen Description BLOOD RIGHT HAND  Final   Special Requests   Final    BOTTLES DRAWN AEROBIC ONLY Blood Culture adequate volume   Culture   Final    NO GROWTH 5 DAYS Performed at [facility], [address] [place], [place] [address]    Report Status 11/27/2022 FINAL  Final    Labs: CBC: Recent Labs  Lab 12/31/22 0927 01/02/23 0440 01/03/23 1104 01/04/23 0552  WBC 9.3 12.3* 9.1 8.2  NEUTROABS 6,705 8.9*  --   --   HGB 11.3* 10.9* 11.6* 9.9*  HCT 36.2 33.7* 35.3* 31.6*  MCV 80.6 78.2* 77.2* 79.4*  PLT 468* 440* 462* 342   Basic Metabolic Panel: Recent Labs  Lab 12/31/22 0927 01/02/23 0440 01/03/23 1104 01/04/23 0552  NA 140 130* 134* 134*  K 4.1 3.3* 4.6 4.0  CL 102 97* 107 104  CO2 22 20* 18* 20*  GLUCOSE 267* 235* 282* 208*  BUN 9 8 <5* <5*  CREATININE 0.85 0.77 0.66 0.75  CALCIUM 9.7 8.5* 8.8* 8.6*  MG  --  1.7  --   --   PHOS  --  3.3  --   --    Liver Function Tests: Recent Labs  Lab 12/31/22 0927 01/02/23 0440 01/03/23 1104 01/04/23 0552  AST 12 13* 13* 13*  ALT 13 16 16 12   ALKPHOS  --  72 65 59  BILITOT 0.4 0.8 0.8 0.7  PROT 7.5 7.4 7.7 6.8  ALBUMIN  --  3.7 3.8 3.3*   CBG: Recent Labs  Lab 01/03/23 1205 01/03/23 1810 01/03/23 2058  01/04/23 0733 01/04/23 1125  GLUCAP 279* 204* 189* 205* 237*    Discharge time spent: approximately 35 minutes spent on discharge counseling, evaluation of patient on day of discharge, and coordination of discharge planning with nursing, social work, pharmacy and case management  Signed: Alberteen Sam, MD [facility] 01/04/2023

## 2023-01-04 NOTE — TOC Transition Note (Signed)
Transition of Care [facility]) - CM/SW Discharge Note   Patient Details  Name: Lori [name] MRN: [id number] Date of Birth: 02-07-1970  [facility]) CM/SW Contact:  Adrian Prows, RN Phone Number: 01/04/2023, 11:42 AM   Clinical Narrative:    D/C orders received; pt transport by Sharin Mons; spoke w/ pt in room; she verified d/c address [address], [place] [address] [place] [phone]; called pt's husband Campbell Stall [phone]); he says he will be at home to receive pt; [facility] called at 1137; spoke w/ operator # [phone]; no TOC needs.   Final next level of care: Home w Home Health Services Barriers to Discharge: No Barriers Identified   Patient Goals and CMS Choice CMS Medicare.gov Compare Post Acute Care list provided to:: Patient Choice offered to / list presented to : Patient  Discharge Placement                         Discharge Plan and Services Additional resources added to the After Visit Summary for   In-house Referral: Clinical Social Work Discharge Planning Services: NA Post Acute Care Choice: Resumption of Svcs/PTA Provider, Home Health          DME Arranged: N/A DME Agency: NA                  Social Determinants of Health (SDOH) Interventions SDOH Screenings   Food Insecurity: No Food Insecurity (01/02/2023)  Housing: Patient Declined (01/02/2023)  Transportation Needs: No Transportation Needs (01/02/2023)  Utilities: Not At Risk (01/02/2023)  Depression (PHQ2-9): Low Risk  (12/24/2022)  Recent Concern: Depression (PHQ2-9) - Medium Risk (10/28/2022)  Tobacco Use: Low Risk  (01/02/2023)     Readmission Risk Interventions    12/28/2022   11:30 AM 11/05/2022    1:38 PM 10/15/2022   10:25 AM  Readmission Risk Prevention Plan  Transportation Screening Complete Complete Complete  Medication Review [employer]) Complete Complete   PCP or Specialist appointment within 3-5 days of discharge Complete Complete Complete   HRI or Home Care Consult Complete Complete Complete  SW Recovery Care/Counseling Consult Complete Complete Complete  Palliative Care Screening Not Applicable Not Applicable Not Applicable  Skilled Nursing Facility Not Applicable Not Applicable Not Applicable

## 2023-01-05 LAB — T3: T3, Total: 114 ng/dL (ref 71–180)

## 2023-01-06 DIAGNOSIS — I69354 Hemiplegia and hemiparesis following cerebral infarction affecting left non-dominant side: Secondary | ICD-10-CM | POA: Diagnosis not present

## 2023-01-06 DIAGNOSIS — E119 Type 2 diabetes mellitus without complications: Secondary | ICD-10-CM | POA: Diagnosis not present

## 2023-01-06 DIAGNOSIS — D509 Iron deficiency anemia, unspecified: Secondary | ICD-10-CM | POA: Diagnosis not present

## 2023-01-06 DIAGNOSIS — E896 Postprocedural adrenocortical (-medullary) hypofunction: Secondary | ICD-10-CM | POA: Diagnosis not present

## 2023-01-06 DIAGNOSIS — I69351 Hemiplegia and hemiparesis following cerebral infarction affecting right dominant side: Secondary | ICD-10-CM | POA: Diagnosis not present

## 2023-01-06 DIAGNOSIS — Z9181 History of falling: Secondary | ICD-10-CM | POA: Diagnosis not present

## 2023-01-06 DIAGNOSIS — I110 Hypertensive heart disease with heart failure: Secondary | ICD-10-CM | POA: Diagnosis not present

## 2023-01-06 DIAGNOSIS — I2510 Atherosclerotic heart disease of native coronary artery without angina pectoris: Secondary | ICD-10-CM | POA: Diagnosis not present

## 2023-01-06 DIAGNOSIS — Z7985 Long-term (current) use of injectable non-insulin antidiabetic drugs: Secondary | ICD-10-CM | POA: Diagnosis not present

## 2023-01-06 DIAGNOSIS — Z7951 Long term (current) use of inhaled steroids: Secondary | ICD-10-CM | POA: Diagnosis not present

## 2023-01-06 DIAGNOSIS — N179 Acute kidney failure, unspecified: Secondary | ICD-10-CM | POA: Diagnosis not present

## 2023-01-06 DIAGNOSIS — Z794 Long term (current) use of insulin: Secondary | ICD-10-CM | POA: Diagnosis not present

## 2023-01-06 DIAGNOSIS — G9341 Metabolic encephalopathy: Secondary | ICD-10-CM | POA: Diagnosis not present

## 2023-01-06 DIAGNOSIS — Z951 Presence of aortocoronary bypass graft: Secondary | ICD-10-CM | POA: Diagnosis not present

## 2023-01-06 DIAGNOSIS — I5032 Chronic diastolic (congestive) heart failure: Secondary | ICD-10-CM | POA: Diagnosis not present

## 2023-01-06 DIAGNOSIS — Z7901 Long term (current) use of anticoagulants: Secondary | ICD-10-CM | POA: Diagnosis not present

## 2023-01-06 DIAGNOSIS — E0590 Thyrotoxicosis, unspecified without thyrotoxic crisis or storm: Secondary | ICD-10-CM | POA: Diagnosis not present

## 2023-01-06 DIAGNOSIS — I10 Essential (primary) hypertension: Secondary | ICD-10-CM | POA: Diagnosis not present

## 2023-01-06 DIAGNOSIS — Z7902 Long term (current) use of antithrombotics/antiplatelets: Secondary | ICD-10-CM | POA: Diagnosis not present

## 2023-01-07 ENCOUNTER — Telehealth: Payer: 59

## 2023-01-07 DIAGNOSIS — N179 Acute kidney failure, unspecified: Secondary | ICD-10-CM | POA: Diagnosis not present

## 2023-01-07 DIAGNOSIS — E119 Type 2 diabetes mellitus without complications: Secondary | ICD-10-CM | POA: Diagnosis not present

## 2023-01-07 DIAGNOSIS — E896 Postprocedural adrenocortical (-medullary) hypofunction: Secondary | ICD-10-CM | POA: Diagnosis not present

## 2023-01-07 DIAGNOSIS — Z7985 Long-term (current) use of injectable non-insulin antidiabetic drugs: Secondary | ICD-10-CM | POA: Diagnosis not present

## 2023-01-07 DIAGNOSIS — I69354 Hemiplegia and hemiparesis following cerebral infarction affecting left non-dominant side: Secondary | ICD-10-CM | POA: Diagnosis not present

## 2023-01-07 DIAGNOSIS — E0590 Thyrotoxicosis, unspecified without thyrotoxic crisis or storm: Secondary | ICD-10-CM | POA: Diagnosis not present

## 2023-01-07 DIAGNOSIS — Z7951 Long term (current) use of inhaled steroids: Secondary | ICD-10-CM | POA: Diagnosis not present

## 2023-01-07 DIAGNOSIS — Z7902 Long term (current) use of antithrombotics/antiplatelets: Secondary | ICD-10-CM | POA: Diagnosis not present

## 2023-01-07 DIAGNOSIS — I69351 Hemiplegia and hemiparesis following cerebral infarction affecting right dominant side: Secondary | ICD-10-CM | POA: Diagnosis not present

## 2023-01-07 DIAGNOSIS — I2510 Atherosclerotic heart disease of native coronary artery without angina pectoris: Secondary | ICD-10-CM | POA: Diagnosis not present

## 2023-01-07 DIAGNOSIS — Z9181 History of falling: Secondary | ICD-10-CM | POA: Diagnosis not present

## 2023-01-07 DIAGNOSIS — Z951 Presence of aortocoronary bypass graft: Secondary | ICD-10-CM | POA: Diagnosis not present

## 2023-01-07 DIAGNOSIS — Z794 Long term (current) use of insulin: Secondary | ICD-10-CM | POA: Diagnosis not present

## 2023-01-07 DIAGNOSIS — Z7901 Long term (current) use of anticoagulants: Secondary | ICD-10-CM | POA: Diagnosis not present

## 2023-01-07 DIAGNOSIS — I5032 Chronic diastolic (congestive) heart failure: Secondary | ICD-10-CM | POA: Diagnosis not present

## 2023-01-07 DIAGNOSIS — G9341 Metabolic encephalopathy: Secondary | ICD-10-CM | POA: Diagnosis not present

## 2023-01-07 DIAGNOSIS — D509 Iron deficiency anemia, unspecified: Secondary | ICD-10-CM | POA: Diagnosis not present

## 2023-01-07 DIAGNOSIS — I10 Essential (primary) hypertension: Secondary | ICD-10-CM | POA: Diagnosis not present

## 2023-01-07 DIAGNOSIS — I110 Hypertensive heart disease with heart failure: Secondary | ICD-10-CM | POA: Diagnosis not present

## 2023-01-07 NOTE — Telephone Encounter (Signed)
Don w/ [facility] called with concerns about pt's heart rate, bp and medications. He reports that pt was missing a few medications and he was advised that rx was send to [facility] home delivery pharmacy per the pt's request on 01/01/23. He reports that pt's HR was 115-120 and BP was 144/91. Roe Coombs was advised that patient does not take medication daily and pt stated that she will not take Gabapentin. Roe Coombs states that he is going to send a request for a nurse to come out and help patient with medications. Roe Coombs states if Carilyn Goodpasture needs to speak with him she can call [phone].

## 2023-01-07 NOTE — Telephone Encounter (Signed)
Please verify if patient has taken her Metoprolol medication.

## 2023-01-08 ENCOUNTER — Other Ambulatory Visit: Payer: Self-pay

## 2023-01-08 DIAGNOSIS — G9341 Metabolic encephalopathy: Secondary | ICD-10-CM | POA: Diagnosis not present

## 2023-01-08 DIAGNOSIS — E1169 Type 2 diabetes mellitus with other specified complication: Secondary | ICD-10-CM

## 2023-01-08 DIAGNOSIS — I69351 Hemiplegia and hemiparesis following cerebral infarction affecting right dominant side: Secondary | ICD-10-CM | POA: Diagnosis not present

## 2023-01-08 DIAGNOSIS — D509 Iron deficiency anemia, unspecified: Secondary | ICD-10-CM | POA: Diagnosis not present

## 2023-01-08 DIAGNOSIS — Z951 Presence of aortocoronary bypass graft: Secondary | ICD-10-CM | POA: Diagnosis not present

## 2023-01-08 DIAGNOSIS — Z7985 Long-term (current) use of injectable non-insulin antidiabetic drugs: Secondary | ICD-10-CM | POA: Diagnosis not present

## 2023-01-08 DIAGNOSIS — Z7901 Long term (current) use of anticoagulants: Secondary | ICD-10-CM | POA: Diagnosis not present

## 2023-01-08 DIAGNOSIS — Z7951 Long term (current) use of inhaled steroids: Secondary | ICD-10-CM | POA: Diagnosis not present

## 2023-01-08 DIAGNOSIS — Z7902 Long term (current) use of antithrombotics/antiplatelets: Secondary | ICD-10-CM | POA: Diagnosis not present

## 2023-01-08 DIAGNOSIS — I10 Essential (primary) hypertension: Secondary | ICD-10-CM | POA: Diagnosis not present

## 2023-01-08 DIAGNOSIS — E0590 Thyrotoxicosis, unspecified without thyrotoxic crisis or storm: Secondary | ICD-10-CM | POA: Diagnosis not present

## 2023-01-08 DIAGNOSIS — E119 Type 2 diabetes mellitus without complications: Secondary | ICD-10-CM | POA: Diagnosis not present

## 2023-01-08 DIAGNOSIS — I69354 Hemiplegia and hemiparesis following cerebral infarction affecting left non-dominant side: Secondary | ICD-10-CM | POA: Diagnosis not present

## 2023-01-08 DIAGNOSIS — I110 Hypertensive heart disease with heart failure: Secondary | ICD-10-CM | POA: Diagnosis not present

## 2023-01-08 DIAGNOSIS — Z794 Long term (current) use of insulin: Secondary | ICD-10-CM | POA: Diagnosis not present

## 2023-01-08 DIAGNOSIS — E896 Postprocedural adrenocortical (-medullary) hypofunction: Secondary | ICD-10-CM | POA: Diagnosis not present

## 2023-01-08 DIAGNOSIS — I5032 Chronic diastolic (congestive) heart failure: Secondary | ICD-10-CM | POA: Diagnosis not present

## 2023-01-08 DIAGNOSIS — N179 Acute kidney failure, unspecified: Secondary | ICD-10-CM | POA: Diagnosis not present

## 2023-01-08 DIAGNOSIS — I2510 Atherosclerotic heart disease of native coronary artery without angina pectoris: Secondary | ICD-10-CM | POA: Diagnosis not present

## 2023-01-08 DIAGNOSIS — Z9181 History of falling: Secondary | ICD-10-CM | POA: Diagnosis not present

## 2023-01-08 MED ORDER — METOPROLOL TARTRATE 100 MG PO TABS: 100.0000 mg | ORAL_TABLET | Freq: Two times a day (BID) | ORAL | 1 refills | Status: DC

## 2023-01-08 MED ORDER — BACLOFEN 10 MG PO TABS: 10.0000 mg | ORAL_TABLET | Freq: Three times a day (TID) | ORAL | 1 refills | Status: DC

## 2023-01-08 NOTE — Telephone Encounter (Signed)
Patient reported taking 20 units instead of prescribed 22 units.

## 2023-01-08 NOTE — Telephone Encounter (Signed)
1.) Patient needs a refill on Baclofen sent to [facility]  2.) Home Health Nurse needs clarification on Lantus rx, should patient be on 22 units or 20   Please advise

## 2023-01-09 ENCOUNTER — Telehealth: Payer: 59

## 2023-01-09 DIAGNOSIS — Z8673 Personal history of transient ischemic attack (TIA), and cerebral infarction without residual deficits: Secondary | ICD-10-CM

## 2023-01-09 DIAGNOSIS — M24542 Contracture, left hand: Secondary | ICD-10-CM

## 2023-01-09 MED ORDER — LANTUS SOLOSTAR 100 UNIT/ML ~~LOC~~ SOPN: 20.0000 [IU] | PEN_INJECTOR | Freq: Every day | SUBCUTANEOUS | Status: DC

## 2023-01-09 NOTE — Telephone Encounter (Signed)
Patient called and states she need a new hospital bed because her old one is broken.

## 2023-01-09 NOTE — Telephone Encounter (Signed)
Hospital bed ordered.

## 2023-01-09 NOTE — Telephone Encounter (Signed)
Spoke with Angelique Blonder and she is aware of providers response and actions taken

## 2023-01-10 ENCOUNTER — Telehealth: Payer: Self-pay | Admitting: *Deleted

## 2023-01-10 ENCOUNTER — Telehealth: Payer: Self-pay

## 2023-01-10 NOTE — Telephone Encounter (Signed)
Called and spoke with patient regarding where to send Hospital Bed Order.  She stated that she uses [facility] [phone]  [facility] and spoke with [facility] and she stated to fax the order to Fax: [phone]  Order Faxed.

## 2023-01-10 NOTE — Telephone Encounter (Signed)
Phones were down and attempted to call patient back about the clinical notes. Patient would have to get some recent notes for new hospital bed when she come on 01/14/2023 at 2:40 per Ngetich, Donalee Citrin, NP

## 2023-01-10 NOTE — Telephone Encounter (Signed)
Patient called in regards the the hospital bed. I spoke with patient to let her know that me and Synetta Fail both received successful fax notices to [employer]. I called [employer] twice and was told they did not receive anything at the moment. I will reach back out on Monday.

## 2023-01-10 NOTE — Telephone Encounter (Signed)
Don with [facility] called requesting verbal orders to Re Evaluate patient for OT.   Verbal orders given.

## 2023-01-13 DIAGNOSIS — I110 Hypertensive heart disease with heart failure: Secondary | ICD-10-CM | POA: Diagnosis not present

## 2023-01-13 DIAGNOSIS — Z7901 Long term (current) use of anticoagulants: Secondary | ICD-10-CM | POA: Diagnosis not present

## 2023-01-13 DIAGNOSIS — Z7985 Long-term (current) use of injectable non-insulin antidiabetic drugs: Secondary | ICD-10-CM | POA: Diagnosis not present

## 2023-01-13 DIAGNOSIS — Z7951 Long term (current) use of inhaled steroids: Secondary | ICD-10-CM | POA: Diagnosis not present

## 2023-01-13 DIAGNOSIS — I25119 Atherosclerotic heart disease of native coronary artery with unspecified angina pectoris: Secondary | ICD-10-CM | POA: Diagnosis not present

## 2023-01-13 DIAGNOSIS — I5032 Chronic diastolic (congestive) heart failure: Secondary | ICD-10-CM | POA: Diagnosis not present

## 2023-01-13 DIAGNOSIS — D509 Iron deficiency anemia, unspecified: Secondary | ICD-10-CM | POA: Diagnosis not present

## 2023-01-13 DIAGNOSIS — I69354 Hemiplegia and hemiparesis following cerebral infarction affecting left non-dominant side: Secondary | ICD-10-CM | POA: Diagnosis not present

## 2023-01-13 DIAGNOSIS — K3184 Gastroparesis: Secondary | ICD-10-CM | POA: Diagnosis not present

## 2023-01-13 DIAGNOSIS — Z794 Long term (current) use of insulin: Secondary | ICD-10-CM | POA: Diagnosis not present

## 2023-01-13 DIAGNOSIS — I69351 Hemiplegia and hemiparesis following cerebral infarction affecting right dominant side: Secondary | ICD-10-CM | POA: Diagnosis not present

## 2023-01-13 DIAGNOSIS — Z951 Presence of aortocoronary bypass graft: Secondary | ICD-10-CM | POA: Diagnosis not present

## 2023-01-13 DIAGNOSIS — G43909 Migraine, unspecified, not intractable, without status migrainosus: Secondary | ICD-10-CM | POA: Diagnosis not present

## 2023-01-13 DIAGNOSIS — Z7902 Long term (current) use of antithrombotics/antiplatelets: Secondary | ICD-10-CM | POA: Diagnosis not present

## 2023-01-13 DIAGNOSIS — I69322 Dysarthria following cerebral infarction: Secondary | ICD-10-CM | POA: Diagnosis not present

## 2023-01-13 DIAGNOSIS — E0590 Thyrotoxicosis, unspecified without thyrotoxic crisis or storm: Secondary | ICD-10-CM | POA: Diagnosis not present

## 2023-01-13 DIAGNOSIS — E896 Postprocedural adrenocortical (-medullary) hypofunction: Secondary | ICD-10-CM | POA: Diagnosis not present

## 2023-01-13 DIAGNOSIS — I69391 Dysphagia following cerebral infarction: Secondary | ICD-10-CM | POA: Diagnosis not present

## 2023-01-13 DIAGNOSIS — E785 Hyperlipidemia, unspecified: Secondary | ICD-10-CM | POA: Diagnosis not present

## 2023-01-13 DIAGNOSIS — E1143 Type 2 diabetes mellitus with diabetic autonomic (poly)neuropathy: Secondary | ICD-10-CM | POA: Diagnosis not present

## 2023-01-14 ENCOUNTER — Encounter (INDEPENDENT_AMBULATORY_CARE_PROVIDER_SITE_OTHER): Payer: 59 | Admitting: Family

## 2023-01-14 DIAGNOSIS — G43909 Migraine, unspecified, not intractable, without status migrainosus: Secondary | ICD-10-CM | POA: Diagnosis not present

## 2023-01-14 DIAGNOSIS — R300 Dysuria: Secondary | ICD-10-CM

## 2023-01-14 DIAGNOSIS — E1143 Type 2 diabetes mellitus with diabetic autonomic (poly)neuropathy: Secondary | ICD-10-CM | POA: Diagnosis not present

## 2023-01-14 DIAGNOSIS — I110 Hypertensive heart disease with heart failure: Secondary | ICD-10-CM | POA: Diagnosis not present

## 2023-01-14 DIAGNOSIS — Z7901 Long term (current) use of anticoagulants: Secondary | ICD-10-CM | POA: Diagnosis not present

## 2023-01-14 DIAGNOSIS — I5032 Chronic diastolic (congestive) heart failure: Secondary | ICD-10-CM | POA: Diagnosis not present

## 2023-01-14 DIAGNOSIS — I69391 Dysphagia following cerebral infarction: Secondary | ICD-10-CM | POA: Diagnosis not present

## 2023-01-14 DIAGNOSIS — I69351 Hemiplegia and hemiparesis following cerebral infarction affecting right dominant side: Secondary | ICD-10-CM | POA: Diagnosis not present

## 2023-01-14 DIAGNOSIS — Z794 Long term (current) use of insulin: Secondary | ICD-10-CM | POA: Diagnosis not present

## 2023-01-14 DIAGNOSIS — Z951 Presence of aortocoronary bypass graft: Secondary | ICD-10-CM | POA: Diagnosis not present

## 2023-01-14 DIAGNOSIS — Z7985 Long-term (current) use of injectable non-insulin antidiabetic drugs: Secondary | ICD-10-CM | POA: Diagnosis not present

## 2023-01-14 DIAGNOSIS — E785 Hyperlipidemia, unspecified: Secondary | ICD-10-CM | POA: Diagnosis not present

## 2023-01-14 DIAGNOSIS — K3184 Gastroparesis: Secondary | ICD-10-CM | POA: Diagnosis not present

## 2023-01-14 DIAGNOSIS — E896 Postprocedural adrenocortical (-medullary) hypofunction: Secondary | ICD-10-CM | POA: Diagnosis not present

## 2023-01-14 DIAGNOSIS — Z7951 Long term (current) use of inhaled steroids: Secondary | ICD-10-CM | POA: Diagnosis not present

## 2023-01-14 DIAGNOSIS — D509 Iron deficiency anemia, unspecified: Secondary | ICD-10-CM | POA: Diagnosis not present

## 2023-01-14 DIAGNOSIS — E0590 Thyrotoxicosis, unspecified without thyrotoxic crisis or storm: Secondary | ICD-10-CM | POA: Diagnosis not present

## 2023-01-14 DIAGNOSIS — I69322 Dysarthria following cerebral infarction: Secondary | ICD-10-CM | POA: Diagnosis not present

## 2023-01-14 DIAGNOSIS — I69354 Hemiplegia and hemiparesis following cerebral infarction affecting left non-dominant side: Secondary | ICD-10-CM | POA: Diagnosis not present

## 2023-01-14 DIAGNOSIS — I25119 Atherosclerotic heart disease of native coronary artery with unspecified angina pectoris: Secondary | ICD-10-CM | POA: Diagnosis not present

## 2023-01-14 DIAGNOSIS — Z7902 Long term (current) use of antithrombotics/antiplatelets: Secondary | ICD-10-CM | POA: Diagnosis not present

## 2023-01-15 ENCOUNTER — Encounter (INDEPENDENT_AMBULATORY_CARE_PROVIDER_SITE_OTHER): Payer: 59 | Admitting: Sports Medicine

## 2023-01-15 DIAGNOSIS — Z7901 Long term (current) use of anticoagulants: Secondary | ICD-10-CM | POA: Diagnosis not present

## 2023-01-15 DIAGNOSIS — Z794 Long term (current) use of insulin: Secondary | ICD-10-CM | POA: Diagnosis not present

## 2023-01-15 DIAGNOSIS — G43909 Migraine, unspecified, not intractable, without status migrainosus: Secondary | ICD-10-CM | POA: Diagnosis not present

## 2023-01-15 DIAGNOSIS — E785 Hyperlipidemia, unspecified: Secondary | ICD-10-CM | POA: Diagnosis not present

## 2023-01-15 DIAGNOSIS — I25119 Atherosclerotic heart disease of native coronary artery with unspecified angina pectoris: Secondary | ICD-10-CM | POA: Diagnosis not present

## 2023-01-15 DIAGNOSIS — I69354 Hemiplegia and hemiparesis following cerebral infarction affecting left non-dominant side: Secondary | ICD-10-CM | POA: Diagnosis not present

## 2023-01-15 DIAGNOSIS — Z7951 Long term (current) use of inhaled steroids: Secondary | ICD-10-CM | POA: Diagnosis not present

## 2023-01-15 DIAGNOSIS — Z7985 Long-term (current) use of injectable non-insulin antidiabetic drugs: Secondary | ICD-10-CM | POA: Diagnosis not present

## 2023-01-15 DIAGNOSIS — K3184 Gastroparesis: Secondary | ICD-10-CM | POA: Diagnosis not present

## 2023-01-15 DIAGNOSIS — I69351 Hemiplegia and hemiparesis following cerebral infarction affecting right dominant side: Secondary | ICD-10-CM | POA: Diagnosis not present

## 2023-01-15 DIAGNOSIS — I5032 Chronic diastolic (congestive) heart failure: Secondary | ICD-10-CM | POA: Diagnosis not present

## 2023-01-15 DIAGNOSIS — Z951 Presence of aortocoronary bypass graft: Secondary | ICD-10-CM | POA: Diagnosis not present

## 2023-01-15 DIAGNOSIS — Z7902 Long term (current) use of antithrombotics/antiplatelets: Secondary | ICD-10-CM | POA: Diagnosis not present

## 2023-01-15 DIAGNOSIS — E0590 Thyrotoxicosis, unspecified without thyrotoxic crisis or storm: Secondary | ICD-10-CM | POA: Diagnosis not present

## 2023-01-15 DIAGNOSIS — E896 Postprocedural adrenocortical (-medullary) hypofunction: Secondary | ICD-10-CM | POA: Diagnosis not present

## 2023-01-15 DIAGNOSIS — I69322 Dysarthria following cerebral infarction: Secondary | ICD-10-CM | POA: Diagnosis not present

## 2023-01-15 DIAGNOSIS — I110 Hypertensive heart disease with heart failure: Secondary | ICD-10-CM | POA: Diagnosis not present

## 2023-01-15 DIAGNOSIS — I69391 Dysphagia following cerebral infarction: Secondary | ICD-10-CM | POA: Diagnosis not present

## 2023-01-15 DIAGNOSIS — E1143 Type 2 diabetes mellitus with diabetic autonomic (poly)neuropathy: Secondary | ICD-10-CM | POA: Diagnosis not present

## 2023-01-15 DIAGNOSIS — D509 Iron deficiency anemia, unspecified: Secondary | ICD-10-CM | POA: Diagnosis not present

## 2023-01-16 DIAGNOSIS — Z7985 Long-term (current) use of injectable non-insulin antidiabetic drugs: Secondary | ICD-10-CM | POA: Diagnosis not present

## 2023-01-16 DIAGNOSIS — E896 Postprocedural adrenocortical (-medullary) hypofunction: Secondary | ICD-10-CM | POA: Diagnosis not present

## 2023-01-16 DIAGNOSIS — I69351 Hemiplegia and hemiparesis following cerebral infarction affecting right dominant side: Secondary | ICD-10-CM | POA: Diagnosis not present

## 2023-01-16 DIAGNOSIS — G43909 Migraine, unspecified, not intractable, without status migrainosus: Secondary | ICD-10-CM | POA: Diagnosis not present

## 2023-01-16 DIAGNOSIS — Z951 Presence of aortocoronary bypass graft: Secondary | ICD-10-CM | POA: Diagnosis not present

## 2023-01-16 DIAGNOSIS — Z794 Long term (current) use of insulin: Secondary | ICD-10-CM | POA: Diagnosis not present

## 2023-01-16 DIAGNOSIS — Z7901 Long term (current) use of anticoagulants: Secondary | ICD-10-CM | POA: Diagnosis not present

## 2023-01-16 DIAGNOSIS — Z7951 Long term (current) use of inhaled steroids: Secondary | ICD-10-CM | POA: Diagnosis not present

## 2023-01-16 DIAGNOSIS — E785 Hyperlipidemia, unspecified: Secondary | ICD-10-CM | POA: Diagnosis not present

## 2023-01-16 DIAGNOSIS — I69354 Hemiplegia and hemiparesis following cerebral infarction affecting left non-dominant side: Secondary | ICD-10-CM | POA: Diagnosis not present

## 2023-01-16 DIAGNOSIS — I69391 Dysphagia following cerebral infarction: Secondary | ICD-10-CM | POA: Diagnosis not present

## 2023-01-16 DIAGNOSIS — I5032 Chronic diastolic (congestive) heart failure: Secondary | ICD-10-CM | POA: Diagnosis not present

## 2023-01-16 DIAGNOSIS — Z7902 Long term (current) use of antithrombotics/antiplatelets: Secondary | ICD-10-CM | POA: Diagnosis not present

## 2023-01-16 DIAGNOSIS — E1143 Type 2 diabetes mellitus with diabetic autonomic (poly)neuropathy: Secondary | ICD-10-CM | POA: Diagnosis not present

## 2023-01-16 DIAGNOSIS — D509 Iron deficiency anemia, unspecified: Secondary | ICD-10-CM | POA: Diagnosis not present

## 2023-01-16 DIAGNOSIS — E0590 Thyrotoxicosis, unspecified without thyrotoxic crisis or storm: Secondary | ICD-10-CM | POA: Diagnosis not present

## 2023-01-16 DIAGNOSIS — I69322 Dysarthria following cerebral infarction: Secondary | ICD-10-CM | POA: Diagnosis not present

## 2023-01-16 DIAGNOSIS — I25119 Atherosclerotic heart disease of native coronary artery with unspecified angina pectoris: Secondary | ICD-10-CM | POA: Diagnosis not present

## 2023-01-16 DIAGNOSIS — I110 Hypertensive heart disease with heart failure: Secondary | ICD-10-CM | POA: Diagnosis not present

## 2023-01-16 DIAGNOSIS — K3184 Gastroparesis: Secondary | ICD-10-CM | POA: Diagnosis not present

## 2023-01-17 NOTE — Progress Notes (Signed)
This encounter was created in error - please disregard.

## 2023-01-18 ENCOUNTER — Other Ambulatory Visit: Payer: Self-pay | Admitting: Family

## 2023-01-18 DIAGNOSIS — K219 Gastro-esophageal reflux disease without esophagitis: Secondary | ICD-10-CM

## 2023-01-19 ENCOUNTER — Emergency Department (HOSPITAL_COMMUNITY): Payer: 59

## 2023-01-19 ENCOUNTER — Inpatient Hospital Stay (HOSPITAL_COMMUNITY)
Admission: EM | Admit: 2023-01-19 | Discharge: 2023-01-21 | DRG: 637 | Disposition: A | Discharge status: Home / Self Care | Payer: 59 | Attending: Family Medicine | Admitting: Family Medicine

## 2023-01-19 ENCOUNTER — Encounter (HOSPITAL_COMMUNITY): Payer: Self-pay

## 2023-01-19 ENCOUNTER — Other Ambulatory Visit: Payer: Self-pay

## 2023-01-19 DIAGNOSIS — I110 Hypertensive heart disease with heart failure: Secondary | ICD-10-CM | POA: Diagnosis not present

## 2023-01-19 DIAGNOSIS — Z91041 Radiographic dye allergy status: Secondary | ICD-10-CM

## 2023-01-19 DIAGNOSIS — E890 Postprocedural hypothyroidism: Secondary | ICD-10-CM | POA: Diagnosis not present

## 2023-01-19 DIAGNOSIS — E860 Dehydration: Secondary | ICD-10-CM | POA: Diagnosis present

## 2023-01-19 DIAGNOSIS — E871 Hypo-osmolality and hyponatremia: Secondary | ICD-10-CM | POA: Diagnosis not present

## 2023-01-19 DIAGNOSIS — R0689 Other abnormalities of breathing: Secondary | ICD-10-CM | POA: Diagnosis not present

## 2023-01-19 DIAGNOSIS — E1143 Type 2 diabetes mellitus with diabetic autonomic (poly)neuropathy: Secondary | ICD-10-CM | POA: Diagnosis not present

## 2023-01-19 DIAGNOSIS — Z881 Allergy status to other antibiotic agents status: Secondary | ICD-10-CM

## 2023-01-19 DIAGNOSIS — Z8249 Family history of ischemic heart disease and other diseases of the circulatory system: Secondary | ICD-10-CM | POA: Diagnosis not present

## 2023-01-19 DIAGNOSIS — Z951 Presence of aortocoronary bypass graft: Secondary | ICD-10-CM

## 2023-01-19 DIAGNOSIS — G9340 Encephalopathy, unspecified: Secondary | ICD-10-CM

## 2023-01-19 DIAGNOSIS — Z7902 Long term (current) use of antithrombotics/antiplatelets: Secondary | ICD-10-CM

## 2023-01-19 DIAGNOSIS — Z7901 Long term (current) use of anticoagulants: Secondary | ICD-10-CM | POA: Diagnosis not present

## 2023-01-19 DIAGNOSIS — R4701 Aphasia: Secondary | ICD-10-CM | POA: Diagnosis not present

## 2023-01-19 DIAGNOSIS — R81 Glycosuria: Secondary | ICD-10-CM | POA: Diagnosis present

## 2023-01-19 DIAGNOSIS — N179 Acute kidney failure, unspecified: Secondary | ICD-10-CM | POA: Diagnosis present

## 2023-01-19 DIAGNOSIS — Z888 Allergy status to other drugs, medicaments and biological substances status: Secondary | ICD-10-CM

## 2023-01-19 DIAGNOSIS — I5032 Chronic diastolic (congestive) heart failure: Secondary | ICD-10-CM | POA: Diagnosis not present

## 2023-01-19 DIAGNOSIS — G9341 Metabolic encephalopathy: Secondary | ICD-10-CM | POA: Diagnosis not present

## 2023-01-19 DIAGNOSIS — E896 Postprocedural adrenocortical (-medullary) hypofunction: Secondary | ICD-10-CM | POA: Diagnosis not present

## 2023-01-19 DIAGNOSIS — R0602 Shortness of breath: Secondary | ICD-10-CM | POA: Diagnosis not present

## 2023-01-19 DIAGNOSIS — I69354 Hemiplegia and hemiparesis following cerebral infarction affecting left non-dominant side: Secondary | ICD-10-CM | POA: Diagnosis not present

## 2023-01-19 DIAGNOSIS — Z6835 Body mass index (BMI) 35.0-35.9, adult: Secondary | ICD-10-CM

## 2023-01-19 DIAGNOSIS — E1165 Type 2 diabetes mellitus with hyperglycemia: Secondary | ICD-10-CM | POA: Diagnosis not present

## 2023-01-19 DIAGNOSIS — Z880 Allergy status to penicillin: Secondary | ICD-10-CM

## 2023-01-19 DIAGNOSIS — F32A Depression, unspecified: Secondary | ICD-10-CM | POA: Diagnosis present

## 2023-01-19 DIAGNOSIS — Z20822 Contact with and (suspected) exposure to covid-19: Secondary | ICD-10-CM | POA: Diagnosis present

## 2023-01-19 DIAGNOSIS — R000 Tachycardia, unspecified: Secondary | ICD-10-CM | POA: Diagnosis not present

## 2023-01-19 DIAGNOSIS — Z794 Long term (current) use of insulin: Secondary | ICD-10-CM | POA: Diagnosis not present

## 2023-01-19 DIAGNOSIS — R0989 Other specified symptoms and signs involving the circulatory and respiratory systems: Secondary | ICD-10-CM | POA: Diagnosis not present

## 2023-01-19 DIAGNOSIS — Z86718 Personal history of other venous thrombosis and embolism: Secondary | ICD-10-CM | POA: Diagnosis not present

## 2023-01-19 DIAGNOSIS — K219 Gastro-esophageal reflux disease without esophagitis: Secondary | ICD-10-CM | POA: Diagnosis not present

## 2023-01-19 DIAGNOSIS — D649 Anemia, unspecified: Secondary | ICD-10-CM | POA: Diagnosis present

## 2023-01-19 DIAGNOSIS — E66812 Obesity, class 2: Secondary | ICD-10-CM | POA: Diagnosis present

## 2023-01-19 DIAGNOSIS — I7389 Other specified peripheral vascular diseases: Secondary | ICD-10-CM | POA: Diagnosis present

## 2023-01-19 DIAGNOSIS — Z885 Allergy status to narcotic agent status: Secondary | ICD-10-CM

## 2023-01-19 DIAGNOSIS — I6782 Cerebral ischemia: Secondary | ICD-10-CM | POA: Diagnosis not present

## 2023-01-19 DIAGNOSIS — Z8679 Personal history of other diseases of the circulatory system: Secondary | ICD-10-CM

## 2023-01-19 DIAGNOSIS — G8194 Hemiplegia, unspecified affecting left nondominant side: Secondary | ICD-10-CM | POA: Diagnosis present

## 2023-01-19 DIAGNOSIS — I25118 Atherosclerotic heart disease of native coronary artery with other forms of angina pectoris: Secondary | ICD-10-CM | POA: Diagnosis present

## 2023-01-19 DIAGNOSIS — Z923 Personal history of irradiation: Secondary | ICD-10-CM | POA: Diagnosis not present

## 2023-01-19 DIAGNOSIS — Z808 Family history of malignant neoplasm of other organs or systems: Secondary | ICD-10-CM

## 2023-01-19 DIAGNOSIS — G4733 Obstructive sleep apnea (adult) (pediatric): Secondary | ICD-10-CM | POA: Diagnosis not present

## 2023-01-19 DIAGNOSIS — E162 Hypoglycemia, unspecified: Secondary | ICD-10-CM | POA: Diagnosis not present

## 2023-01-19 DIAGNOSIS — Z886 Allergy status to analgesic agent status: Secondary | ICD-10-CM

## 2023-01-19 DIAGNOSIS — Z8673 Personal history of transient ischemic attack (TIA), and cerebral infarction without residual deficits: Secondary | ICD-10-CM | POA: Diagnosis not present

## 2023-01-19 DIAGNOSIS — F419 Anxiety disorder, unspecified: Secondary | ICD-10-CM | POA: Diagnosis present

## 2023-01-19 DIAGNOSIS — E1100 Type 2 diabetes mellitus with hyperosmolarity without nonketotic hyperglycemic-hyperosmolar coma (NKHHC): Secondary | ICD-10-CM | POA: Diagnosis not present

## 2023-01-19 DIAGNOSIS — E785 Hyperlipidemia, unspecified: Secondary | ICD-10-CM | POA: Diagnosis present

## 2023-01-19 DIAGNOSIS — R739 Hyperglycemia, unspecified: Principal | ICD-10-CM

## 2023-01-19 DIAGNOSIS — D75839 Thrombocytosis, unspecified: Secondary | ICD-10-CM | POA: Diagnosis present

## 2023-01-19 DIAGNOSIS — K3184 Gastroparesis: Secondary | ICD-10-CM | POA: Diagnosis present

## 2023-01-19 DIAGNOSIS — I2510 Atherosclerotic heart disease of native coronary artery without angina pectoris: Secondary | ICD-10-CM | POA: Diagnosis present

## 2023-01-19 DIAGNOSIS — I10 Essential (primary) hypertension: Secondary | ICD-10-CM | POA: Diagnosis present

## 2023-01-19 DIAGNOSIS — R1111 Vomiting without nausea: Secondary | ICD-10-CM | POA: Diagnosis not present

## 2023-01-19 DIAGNOSIS — Z833 Family history of diabetes mellitus: Secondary | ICD-10-CM

## 2023-01-19 DIAGNOSIS — Z825 Family history of asthma and other chronic lower respiratory diseases: Secondary | ICD-10-CM

## 2023-01-19 DIAGNOSIS — Z79899 Other long term (current) drug therapy: Secondary | ICD-10-CM

## 2023-01-19 DIAGNOSIS — Z832 Family history of diseases of the blood and blood-forming organs and certain disorders involving the immune mechanism: Secondary | ICD-10-CM

## 2023-01-19 HISTORY — DX: Type 2 diabetes mellitus with hyperosmolarity without nonketotic hyperglycemic-hyperosmolar coma (NKHHC): E11.00

## 2023-01-19 LAB — BLOOD GAS, VENOUS
Acid-Base Excess: 6.3 mmol/L — ABNORMAL HIGH (ref 0.0–2.0)
Bicarbonate: 33.1 mmol/L — ABNORMAL HIGH (ref 20.0–28.0)
O2 Saturation: 44.3 %
Patient temperature: 37
pCO2, Ven: 56 mm[Hg] (ref 44–60)
pH, Ven: 7.38 (ref 7.25–7.43)
pO2, Ven: <31 mm[Hg] — CL (ref 32–45)

## 2023-01-19 LAB — BASIC METABOLIC PANEL
Anion gap: 13 (ref 5–15)
Anion gap: 7 (ref 5–15)
BUN: 36 mg/dL — ABNORMAL HIGH (ref 6–20)
BUN: 30 mg/dL — ABNORMAL HIGH (ref 6–20)
CO2: 19 mmol/L — ABNORMAL LOW (ref 22–32)
CO2: 25 mmol/L (ref 22–32)
Calcium: 9.1 mg/dL (ref 8.9–10.3)
Calcium: 9.2 mg/dL (ref 8.9–10.3)
Chloride: 98 mmol/L (ref 98–111)
Chloride: 102 mmol/L (ref 98–111)
Creatinine, Ser: 1.5 mg/dL — ABNORMAL HIGH (ref 0.44–1.00)
Creatinine, Ser: 1.16 mg/dL — ABNORMAL HIGH (ref 0.44–1.00)
GFR, Estimated: 42 mL/min — ABNORMAL LOW (ref >60)
GFR, Estimated: 57 mL/min — ABNORMAL LOW (ref >60)
Glucose, Bld: 243 mg/dL — ABNORMAL HIGH (ref 70–99)
Glucose, Bld: 178 mg/dL — ABNORMAL HIGH (ref 70–99)
Potassium: 4.8 mmol/L (ref 3.5–5.1)
Potassium: 4.2 mmol/L (ref 3.5–5.1)
Sodium: 130 mmol/L — ABNORMAL LOW (ref 135–145)
Sodium: 134 mmol/L — ABNORMAL LOW (ref 135–145)

## 2023-01-19 LAB — BETA-HYDROXYBUTYRIC ACID: Beta-Hydroxybutyric Acid: 0.28 mmol/L — ABNORMAL HIGH (ref 0.05–0.27)

## 2023-01-19 LAB — URINALYSIS, ROUTINE W REFLEX MICROSCOPIC
Bilirubin Urine: NEGATIVE
Glucose, UA: >=500 mg/dL — AB
Hgb urine dipstick: NEGATIVE
Ketones, ur: NEGATIVE mg/dL
Leukocytes,Ua: NEGATIVE
Nitrite: NEGATIVE
Protein, ur: NEGATIVE mg/dL
Specific Gravity, Urine: 1.007 (ref 1.005–1.030)
pH: 6 (ref 5.0–8.0)

## 2023-01-19 LAB — RESP PANEL BY RT-PCR (RSV, FLU A&B, COVID)  RVPGX2
Influenza A by PCR: NEGATIVE
Influenza B by PCR: NEGATIVE
Resp Syncytial Virus by PCR: NEGATIVE
SARS Coronavirus 2 by RT PCR: NEGATIVE

## 2023-01-19 LAB — CBG MONITORING, ED
Glucose-Capillary: 482 mg/dL — ABNORMAL HIGH (ref 70–99)
Glucose-Capillary: 348 mg/dL — ABNORMAL HIGH (ref 70–99)
Glucose-Capillary: 262 mg/dL — ABNORMAL HIGH (ref 70–99)

## 2023-01-19 LAB — CBC
HCT: 33.3 % — ABNORMAL LOW (ref 36.0–46.0)
Hemoglobin: 10.8 g/dL — ABNORMAL LOW (ref 12.0–15.0)
MCH: 24.6 pg — ABNORMAL LOW (ref 26.0–34.0)
MCHC: 32.4 g/dL (ref 30.0–36.0)
MCV: 75.9 fL — ABNORMAL LOW (ref 80.0–100.0)
Platelets: 455 10*3/uL — ABNORMAL HIGH (ref 150–400)
RBC: 4.39 MIL/uL (ref 3.87–5.11)
RDW: 15.9 % — ABNORMAL HIGH (ref 11.5–15.5)
WBC: 8.6 10*3/uL (ref 4.0–10.5)
nRBC: 0 % (ref 0.0–0.2)

## 2023-01-19 LAB — COMPREHENSIVE METABOLIC PANEL
ALT: 13 U/L (ref 0–44)
AST: 11 U/L — ABNORMAL LOW (ref 15–41)
Albumin: 3.8 g/dL (ref 3.5–5.0)
Alkaline Phosphatase: 78 U/L (ref 38–126)
Anion gap: 12 (ref 5–15)
BUN: 47 mg/dL — ABNORMAL HIGH (ref 6–20)
CO2: 26 mmol/L (ref 22–32)
Calcium: 9.3 mg/dL (ref 8.9–10.3)
Chloride: 85 mmol/L — ABNORMAL LOW (ref 98–111)
Creatinine, Ser: 2.15 mg/dL — ABNORMAL HIGH (ref 0.44–1.00)
GFR, Estimated: 27 mL/min — ABNORMAL LOW (ref >60)
Glucose, Bld: 503 mg/dL (ref 70–99)
Potassium: 5.1 mmol/L (ref 3.5–5.1)
Sodium: 123 mmol/L — ABNORMAL LOW (ref 135–145)
Total Bilirubin: 0.5 mg/dL (ref <1.2)
Total Protein: 7.6 g/dL (ref 6.5–8.1)

## 2023-01-19 LAB — GLUCOSE, CAPILLARY
Glucose-Capillary: 191 mg/dL — ABNORMAL HIGH (ref 70–99)
Glucose-Capillary: 234 mg/dL — ABNORMAL HIGH (ref 70–99)
Glucose-Capillary: 192 mg/dL — ABNORMAL HIGH (ref 70–99)
Glucose-Capillary: 210 mg/dL — ABNORMAL HIGH (ref 70–99)
Glucose-Capillary: 189 mg/dL — ABNORMAL HIGH (ref 70–99)
Glucose-Capillary: 195 mg/dL — ABNORMAL HIGH (ref 70–99)

## 2023-01-19 LAB — OSMOLALITY: Osmolality: 308 mosm/kg — ABNORMAL HIGH (ref 275–295)

## 2023-01-19 LAB — MRSA NEXT GEN BY PCR, NASAL: MRSA by PCR Next Gen: NOT DETECTED

## 2023-01-19 MED ORDER — ESCITALOPRAM OXALATE 20 MG PO TABS
20.0000 mg | ORAL_TABLET | Freq: Every morning | ORAL | Status: DC
  Administered 2023-01-20 – 2023-01-21 (×2): 20 mg via ORAL
  Filled 2023-01-19 (×2): qty 1

## 2023-01-19 MED ORDER — INSULIN REGULAR(HUMAN) IN NACL 100-0.9 UT/100ML-% IV SOLN
INTRAVENOUS | Status: AC
  Administered 2023-01-19: 11 [IU]/h via INTRAVENOUS
  Filled 2023-01-19: qty 100

## 2023-01-19 MED ORDER — ACETAMINOPHEN 325 MG PO TABS
650.0000 mg | ORAL_TABLET | Freq: Four times a day (QID) | ORAL | Status: DC | PRN
  Administered 2023-01-20 (×2): 650 mg via ORAL
  Filled 2023-01-19 (×3): qty 2

## 2023-01-19 MED ORDER — INSULIN ASPART 100 UNIT/ML IJ SOLN
8.0000 [IU] | Freq: Once | INTRAMUSCULAR | Status: AC
Start: 2023-01-19 — End: 2023-01-19
  Administered 2023-01-19: 8 [IU] via INTRAVENOUS
  Filled 2023-01-19: qty 0.08

## 2023-01-19 MED ORDER — ORAL CARE MOUTH RINSE: 15.0000 mL | OROMUCOSAL | Status: DC | PRN

## 2023-01-19 MED ORDER — LACTATED RINGERS IV SOLN: INTRAVENOUS | Status: DC

## 2023-01-19 MED ORDER — METHIMAZOLE 5 MG PO TABS
5.0000 mg | ORAL_TABLET | Freq: Every morning | ORAL | Status: DC
  Administered 2023-01-20 – 2023-01-21 (×2): 5 mg via ORAL
  Filled 2023-01-19 (×2): qty 1

## 2023-01-19 MED ORDER — TOPIRAMATE 25 MG PO TABS
25.0000 mg | ORAL_TABLET | Freq: Two times a day (BID) | ORAL | Status: DC
  Administered 2023-01-20 – 2023-01-21 (×3): 25 mg via ORAL
  Filled 2023-01-19 (×3): qty 1

## 2023-01-19 MED ORDER — ACETAMINOPHEN 650 MG RE SUPP
650.0000 mg | Freq: Four times a day (QID) | RECTAL | Status: DC | PRN
Start: 2023-01-19 — End: 2023-01-21

## 2023-01-19 MED ORDER — AMLODIPINE BESYLATE 10 MG PO TABS
10.0000 mg | ORAL_TABLET | Freq: Every morning | ORAL | Status: DC
  Administered 2023-01-20 – 2023-01-21 (×2): 10 mg via ORAL
  Filled 2023-01-19 (×2): qty 1

## 2023-01-19 MED ORDER — ONDANSETRON HCL 4 MG PO TABS: 4.0000 mg | ORAL_TABLET | Freq: Four times a day (QID) | ORAL | Status: DC | PRN

## 2023-01-19 MED ORDER — BISACODYL 5 MG PO TBEC: 5.0000 mg | DELAYED_RELEASE_TABLET | Freq: Every day | ORAL | Status: DC | PRN

## 2023-01-19 MED ORDER — PANTOPRAZOLE SODIUM 40 MG PO TBEC
40.0000 mg | DELAYED_RELEASE_TABLET | Freq: Every day | ORAL | Status: DC
  Administered 2023-01-20 – 2023-01-21 (×2): 40 mg via ORAL
  Filled 2023-01-19 (×2): qty 1

## 2023-01-19 MED ORDER — BACLOFEN 10 MG PO TABS
10.0000 mg | ORAL_TABLET | Freq: Three times a day (TID) | ORAL | Status: DC
  Administered 2023-01-20 – 2023-01-21 (×4): 10 mg via ORAL
  Filled 2023-01-19 (×4): qty 1

## 2023-01-19 MED ORDER — LACTATED RINGERS IV BOLUS
20.0000 mL/kg | Freq: Once | INTRAVENOUS | Status: AC
  Administered 2023-01-19: 1768 mL via INTRAVENOUS

## 2023-01-19 MED ORDER — ONDANSETRON HCL 4 MG/2ML IJ SOLN
4.0000 mg | Freq: Once | INTRAMUSCULAR | Status: AC
Start: 2023-01-19 — End: 2023-01-19
  Administered 2023-01-19: 4 mg via INTRAVENOUS
  Filled 2023-01-19: qty 2

## 2023-01-19 MED ORDER — RIVAROXABAN 10 MG PO TABS
10.0000 mg | ORAL_TABLET | Freq: Every day | ORAL | Status: DC
  Administered 2023-01-20 – 2023-01-21 (×2): 10 mg via ORAL
  Filled 2023-01-19 (×2): qty 1

## 2023-01-19 MED ORDER — SODIUM CHLORIDE 0.9 % IV BOLUS
1000.0000 mL | Freq: Once | INTRAVENOUS | Status: AC
  Administered 2023-01-19: 1000 mL via INTRAVENOUS

## 2023-01-19 MED ORDER — DEXTROSE IN LACTATED RINGERS 5 % IV SOLN: INTRAVENOUS | Status: AC

## 2023-01-19 MED ORDER — CHLORHEXIDINE GLUCONATE CLOTH 2 % EX PADS
6.0000 | MEDICATED_PAD | Freq: Every day | CUTANEOUS | Status: DC
  Administered 2023-01-19 – 2023-01-21 (×2): 6 via TOPICAL

## 2023-01-19 MED ORDER — SODIUM CHLORIDE 0.9% FLUSH
10.0000 mL | Freq: Two times a day (BID) | INTRAVENOUS | Status: DC
  Administered 2023-01-19 – 2023-01-21 (×4): 10 mL via INTRAVENOUS

## 2023-01-19 MED ORDER — CLOPIDOGREL BISULFATE 75 MG PO TABS
75.0000 mg | ORAL_TABLET | Freq: Every day | ORAL | Status: DC
  Administered 2023-01-20 – 2023-01-21 (×2): 75 mg via ORAL
  Filled 2023-01-19 (×2): qty 1

## 2023-01-19 MED ORDER — METOCLOPRAMIDE HCL 5 MG PO TABS
5.0000 mg | ORAL_TABLET | Freq: Three times a day (TID) | ORAL | Status: DC
  Administered 2023-01-20 – 2023-01-21 (×5): 5 mg via ORAL
  Filled 2023-01-19 (×5): qty 1

## 2023-01-19 MED ORDER — EZETIMIBE 10 MG PO TABS
10.0000 mg | ORAL_TABLET | Freq: Every day | ORAL | Status: DC
  Administered 2023-01-20 – 2023-01-21 (×2): 10 mg via ORAL
  Filled 2023-01-19 (×2): qty 1

## 2023-01-19 MED ORDER — TAMSULOSIN HCL 0.4 MG PO CAPS
0.4000 mg | ORAL_CAPSULE | Freq: Every day | ORAL | Status: DC
  Administered 2023-01-20: 0.4 mg via ORAL
  Filled 2023-01-19: qty 1

## 2023-01-19 MED ORDER — METOPROLOL TARTRATE 25 MG PO TABS
100.0000 mg | ORAL_TABLET | Freq: Two times a day (BID) | ORAL | Status: DC
Start: 2023-01-20 — End: 2023-01-21
  Administered 2023-01-20 – 2023-01-21 (×3): 100 mg via ORAL
  Filled 2023-01-19 (×3): qty 4

## 2023-01-19 MED ORDER — DEXTROSE 50 % IV SOLN: 0.0000 mL | INTRAVENOUS | Status: DC | PRN

## 2023-01-19 MED ORDER — RANOLAZINE ER 500 MG PO TB12
500.0000 mg | ORAL_TABLET | Freq: Two times a day (BID) | ORAL | Status: DC
  Administered 2023-01-20 – 2023-01-21 (×3): 500 mg via ORAL
  Filled 2023-01-19 (×4): qty 1

## 2023-01-19 MED ORDER — SODIUM CHLORIDE 0.9% FLUSH
10.0000 mL | Freq: Two times a day (BID) | INTRAVENOUS | Status: DC
  Administered 2023-01-19 – 2023-01-20 (×3): 10 mL via INTRAVENOUS

## 2023-01-19 MED ORDER — ONDANSETRON HCL 4 MG/2ML IJ SOLN: 4.0000 mg | Freq: Four times a day (QID) | INTRAMUSCULAR | Status: DC | PRN

## 2023-01-19 NOTE — Plan of Care (Signed)
  Problem: Elimination: Goal: Will not experience complications related to urinary retention Outcome: Progressing   Problem: Cardiac: Goal: Ability to maintain an adequate cardiac output will improve Outcome: Progressing   Problem: Urinary Elimination: Goal: Ability to achieve and maintain adequate renal perfusion and functioning will improve Outcome: Progressing

## 2023-01-19 NOTE — ED Notes (Signed)
ED TO INPATIENT HANDOFF REPORT  ED Nurse Name and Phone #: Thamas Jaegers Name/Age/Gender Lori [name] 52 y.o. female Room/Bed: WA04/WA04  Code Status   Code Status: Prior  Home/SNF/Other Home Patient oriented to: self, place, time, and situation Is this baseline? Yes   Triage Complete: Triage complete  Chief Complaint Diabetic hyperosmolar non-ketotic state (HCC) [E11.00]  Triage Note BIB EMS from home for BS in the 500s. Husband initially called out for hypoglycemia. Pt takes subcut insulin, but does not check BS at home. Pt is lethargic. Had prior stroke and has left sided deficit. Pt has also been vomiting per husband   Allergies Allergies  Allergen Reactions   Asa [Aspirin] Anaphylaxis, Swelling and Other (See Comments)    Throat closes   Contrast Media [Iodinated Contrast Media] Anaphylaxis and Swelling   Imitrex [Sumatriptan] Anaphylaxis   Ms Contin [Morphine] Anaphylaxis and Swelling   Penicillins Swelling and Other (See Comments)    Mouth and eyes swell   Firvanq [Vancomycin] Other (See Comments)    Red Man's Syndrome   Cipro [Ciprofloxacin Hcl] Nausea And Vomiting   Pork-Derived Products Other (See Comments)    No known allergy but does not want to eat it because of religious values   Nitrofuran Derivatives Anxiety   Wound Dressing Adhesive Itching and Rash    Level of Care/Admitting Diagnosis ED Disposition     ED Disposition  Admit   Condition  --   Comment  Hospital Area: [facility] [[id number]]  Level of Care: Stepdown [14]  Admit to SDU based on following criteria: Severe physiological/psychological symptoms:  Any diagnosis requiring assessment & intervention at least every 4 hours on an ongoing basis to obtain desired patient outcomes including stability and rehabilitation  May place patient in observation at [facility] or [facility] if equivalent level of care is available:: No  Covid Evaluation: Asymptomatic  - no recent exposure (last 10 days) testing not required  Diagnosis: Diabetic hyperosmolar non-ketotic state [facility]) [[id number]]  Admitting Physician: Bobette Mo [[id number]]  Attending Physician: Bobette Mo [[id number]]          B Medical/Surgery History Past Medical History:  Diagnosis Date   CAD (coronary artery disease)    Depression    Diabetes mellitus without complication (HCC)    History of CT scan    History of mammogram    History of MRI    Hypertension    Pheochromocytoma    Stroke (HCC)    Thyroid disease    Past Surgical History:  Procedure Laterality Date   ABDOMINAL HYSTERECTOMY     CESAREAN SECTION     3   CORONARY ARTERY BYPASS GRAFT     Right adrenal gland removal for pheochromocytoma Right      A IV Location/Drains/Wounds Patient Lines/Drains/Airways Status     Active Line/Drains/Airways     Name Placement date Placement time Site Days   Peripheral IV 01/19/23 20 G 2.5" Distal;Right;Upper;Medial Arm 01/19/23  1226  Arm  less than 1   Wound / Incision (Open or Dehisced) 11/06/22 Irritant Dermatitis (Moisture Associated Skin Damage) Groin Anterior;Right;Left  11/06/22  1017  Groin  74            Intake/Output Last 24 hours  Intake/Output Summary (Last 24 hours) at 01/19/2023 1507 Last data filed at 01/19/2023 1321 Gross per 24 hour  Intake 1000 ml  Output --  Net 1000 ml    Labs/Imaging Results for orders placed  or performed during the hospital encounter of 01/19/23 (from the past 48 hours)  CBG monitoring, ED     Status: Abnormal   Collection Time: 01/19/23 11:21 AM  Result Value Ref Range   Glucose-Capillary 482 (H) 70 - 99 mg/dL    Comment: Glucose reference range applies only to samples taken after fasting for at least 8 hours.  Beta-hydroxybutyric acid     Status: Abnormal   Collection Time: 01/19/23 12:28 PM  Result Value Ref Range   Beta-Hydroxybutyric Acid 0.28 (H) 0.05 - 0.27 mmol/L    Comment: Performed at [facility], [address] [place], [place] [address]  CBC     Status: Abnormal   Collection Time: 01/19/23 12:30 PM  Result Value Ref Range   WBC 8.6 4.0 - 10.5 K/uL   RBC 4.39 3.87 - 5.11 MIL/uL   Hemoglobin 10.8 (L) 12.0 - 15.0 g/dL   HCT 24.4 (L) 01.0 - 27.2 %   MCV 75.9 (L) 80.0 - 100.0 fL   MCH 24.6 (L) 26.0 - 34.0 pg   MCHC 32.4 30.0 - 36.0 g/dL   RDW 53.6 (H) 64.4 - 03.4 %   Platelets 455 (H) 150 - 400 K/uL   nRBC 0.0 0.0 - 0.2 %    Comment: Performed at [facility], [address], [place], [place] [address]  Comprehensive metabolic panel     Status: Abnormal   Collection Time: 01/19/23 12:30 PM  Result Value Ref Range   Sodium 123 (L) 135 - 145 mmol/L   Potassium 5.1 3.5 - 5.1 mmol/L   Chloride 85 (L) 98 - 111 mmol/L   CO2 26 22 - 32 mmol/L   Glucose, Bld 503 (HH) 70 - 99 mg/dL    Comment: CRITICAL RESULT CALLED TO, READ BACK BY AND VERIFIED WITH SHEPARD, H RN AT 1342 ON 01/19/2023 BY XIONG, K Glucose reference range applies only to samples taken after fasting for at least 8 hours.    BUN 47 (H) 6 - 20 mg/dL   Creatinine, Ser 5.63 (H) 0.44 - 1.00 mg/dL   Calcium 9.3 8.9 - 87.5 mg/dL   Total Protein 7.6 6.5 - 8.1 g/dL   Albumin 3.8 3.5 - 5.0 g/dL   AST 11 (L) 15 - 41 U/L   ALT 13 0 - 44 U/L   Alkaline Phosphatase 78 38 - 126 U/L   Total Bilirubin 0.5 <1.2 mg/dL   GFR, Estimated 27 (L) >60 mL/min    Comment: (NOTE) Calculated using the CKD-EPI Creatinine Equation (2021)    Anion gap 12 5 - 15    Comment: Performed at [facility], [address] [place], [place] [address]  Blood gas, venous (at [facility] and [facility])     Status: Abnormal   Collection Time: 01/19/23 12:30 PM  Result Value Ref Range   pH, Ven 7.38 7.25 - 7.43   pCO2, Ven 56 44 - 60 mmHg   pO2, Ven <31 (LL) 32 - 45 mmHg    Comment: CRITICAL RESULT CALLED TO, READ BACK BY AND VERIFIED WITH: TEETER, Z RN AT 1302 ON 01/19/2023 BY Deedra Ehrich, K    Bicarbonate  33.1 (H) 20.0 - 28.0 mmol/L   Acid-Base Excess 6.3 (H) 0.0 - 2.0 mmol/L   O2 Saturation 44.3 %   Patient temperature 37.0     Comment: Performed at [facility], [address] [place], [place] [address]  CBG monitoring, ED     Status:  Abnormal   Collection Time: 01/19/23  2:23 PM  Result Value Ref Range   Glucose-Capillary 348 (H) 70 - 99 mg/dL    Comment: Glucose reference range applies only to samples taken after fasting for at least 8 hours.   CT Head Wo Contrast Result Date: 01/19/2023 CLINICAL DATA:  52 year old female with history of altered mental status. EXAM: CT HEAD WITHOUT CONTRAST TECHNIQUE: Contiguous axial images were obtained from the base of the skull through the vertex without intravenous contrast. RADIATION DOSE REDUCTION: This exam was performed according to the departmental dose-optimization program which includes automated exposure control, adjustment of the mA and/or kV according to patient size and/or use of iterative reconstruction technique. COMPARISON:  Head CT 11/22/2022. FINDINGS: Brain: Mild cerebral and cerebellar atrophy. Patchy and confluent areas of decreased attenuation are noted throughout the deep and periventricular white matter of the cerebral hemispheres bilaterally, compatible with chronic microvascular ischemic disease. Multiple more well-defined areas of low attenuation in the basal ganglia bilaterally (right greater than left), similar to prior studies, compatible with old lacunar infarcts. There is also well-defined low-attenuation in the left frontal lobe and the left cerebellar hemisphere, compatible with areas of encephalomalacia from remote infarcts, similar to the prior study. No evidence of acute infarction, hemorrhage, hydrocephalus, extra-axial collection or mass lesion/mass effect. Vascular: No hyperdense vessel or unexpected calcification. Skull: Normal. Negative for fracture or focal lesion. Sinuses/Orbits: No acute finding.  Other: None. IMPRESSION: 1. No acute intracranial abnormalities. 2. Mild cerebral and cerebellar atrophy with extensive chronic microvascular ischemic changes in the cerebral white matter, old basal ganglia lacunar infarcts bilaterally, and old left frontal and left cerebellar hemisphere infarcts, similar to prior examination. Electronically Signed   By: Trudie Reed M.D.   On: 01/19/2023 12:13   DG Chest Portable 1 View Result Date: 01/19/2023 CLINICAL DATA:  52 year old female with shortness of breath, hypoglycemia, lethargy. EXAM: PORTABLE CHEST 1 VIEW COMPARISON:  Portable chest 12/25/2022 and earlier. FINDINGS: Portable AP semi upright view at 1138 hours. Low lung volumes, similar to prior. Chronic sternotomy. Mediastinal contours remain within normal limits. Visualized tracheal air column is within normal limits. Allowing for portable technique the lungs are clear. Evidence of right lung azygous vein and fissure (normal variant). Negative visible bowel gas. IMPRESSION: Low lung volumes, otherwise no acute cardiopulmonary abnormality. Electronically Signed   By: Odessa Fleming M.D.   On: 01/19/2023 12:09    Pending Labs Unresulted Labs (From admission, onward)     Start     Ordered   01/19/23 1401  Urinalysis, Routine w reflex microscopic -Urine, Clean Catch  Once,   URGENT       Question:  Specimen Source  Answer:  Urine, Clean Catch   01/19/23 1400   01/19/23 1401  Resp panel by RT-PCR (RSV, Flu A&B, Covid) Anterior Nasal Swab  Once,   URGENT        01/19/23 1400   01/19/23 1401  Basic metabolic panel  (Hyperglycemic Hyperosmolar State (HHS))  STAT Now then every 4 hours ,   STAT      01/19/23 1401   01/19/23 1401  Osmolality  (Hyperglycemic Hyperosmolar State (HHS))  Once,   URGENT        01/19/23 1401            Vitals/Pain Today's Vitals   01/19/23 1130 01/19/23 1305 01/19/23 1330 01/19/23 1430  BP: 108/70 124/86 (!) 112/98 126/81  Pulse: (!) 106 96 (!) 108 (!) 102  Resp: (!)  26  16 14 15   Temp:      TempSrc:      SpO2: 99% 99% 92% 98%  Weight:      Height:      PainSc:        Isolation Precautions No active isolations  Medications Medications  insulin regular, human (MYXREDLIN) 100 units/ 100 mL infusion (11 Units/hr Intravenous New Bag/Given 01/19/23 1431)  lactated ringers infusion ( Intravenous New Bag/Given 01/19/23 1507)  dextrose 50 % solution 0-50 mL (has no administration in time range)  sodium chloride flush (NS) 0.9 % injection 10 mL (10 mLs Intravenous Given 01/19/23 1409)  sodium chloride 0.9 % bolus 1,000 mL (0 mLs Intravenous Stopped 01/19/23 1321)  insulin aspart (novoLOG) injection 8 Units (8 Units Intravenous Given 01/19/23 1321)  lactated ringers bolus 1,768 mL (1,768 mLs Intravenous New Bag/Given 01/19/23 1420)  ondansetron (ZOFRAN) injection 4 mg (4 mg Intravenous Given 01/19/23 1420)    Mobility non-ambulatory     Focused Assessments     R Recommendations: See Admitting Provider Note  Report given to:   Additional Notes:

## 2023-01-19 NOTE — H&P (Signed)
History and Physical    Patient: Lori [name] HYQ:[id number] DOB: 1970-11-16 DOA: 01/19/2023 DOS: the patient was seen and examined on 01/19/2023 PCP: Caesar Bookman, NP  Patient coming from: Home  Chief Complaint:  Chief Complaint  Patient presents with   Hyperglycemia   HPI: Lori [name] is a 52 y.o. female with medical history significant of CAD/CABG, depression, anxiety, type 2 diabetes, hyperlipidemia, diabetic gastroparesis, history of DVT, hypertension, pheochromocytoma, hypothyroidism, post ablation hypothyroidism, class II obesity, history of CVA with left-sided spastic hemiparesis who presented to the emergency department with complaints of persistent nausea and emesis. She was recently admitted from 12/25/2022 until 12/28/2022 with diabetic gastroparesis.  She has also been mated with similar symptoms like today in the setting of hyperglycemic state with varying degrees of encephalopathy requiring insulin infusion.  She is unable to provide further information at this time..   Lab work: Urine analysis show glucosuria with rare bacteria.  Negative coronavirus, influenza and RSV PCR.  CBC 8.6, hemoglobin 10.8 g/dL platelets 952.  Venous blood gas shows PaO2 less than 31 mmHg, HCO3 33.1 and acid-base acid 6.3 mmol/L.  pH, pCO2 and O2 saturation were normal.  CMP showed glucose of 503, BUN of 47 and creatinine of 2.15 mg/deciliter.  Corrected sodium 133 mmol/L.  Normal potassium and CO2 with a normal anion gap.  Calcium is normal.  LFTs were unremarkable.  Beta hydroxybutyric acid 0.28.  Imaging: Low lung volumes, otherwise no acute pulmonary abnormality.  CT head without contrast with no acute intracranial normality.  There is mild cerebral and cerebellar atrophy with extensive chronic microvascular ischemic changes in the white matter.  Old infarcts.   ED course: Initial vital signs were temperature 97.7 F, pulse 110, respirations 16, BP  103/75 mmHg O2 sat 100% on room air.  The patient received insulin 8 units SQ x 1, LR 1768 mL bolus, ondansetron 4 mg IVP and 1000 mL normal saline bolus.  Review of Systems: As mentioned in the history of present illness. All other systems reviewed and are negative. Past Medical History:  Diagnosis Date   CAD (coronary artery disease)    Depression    Diabetes mellitus without complication (HCC)    History of CT scan    History of mammogram    History of MRI    Hypertension    Pheochromocytoma    Stroke [facility])    Thyroid disease    Past Surgical History:  Procedure Laterality Date   ABDOMINAL HYSTERECTOMY     CESAREAN SECTION     3   CORONARY ARTERY BYPASS GRAFT     Right adrenal gland removal for pheochromocytoma Right    Social History:  reports that she has never smoked. She has never used smokeless tobacco. She reports that she does not currently use alcohol. She reports that she does not use drugs.  Allergies  Allergen Reactions   Asa [Aspirin] Anaphylaxis, Swelling and Other (See Comments)    Throat closes   Contrast Media [Iodinated Contrast Media] Anaphylaxis and Swelling   Imitrex [Sumatriptan] Anaphylaxis   Ms Contin [Morphine] Anaphylaxis and Swelling   Penicillins Swelling and Other (See Comments)    Mouth and eyes swell   Firvanq [Vancomycin] Other (See Comments)    Red Man's Syndrome   Cipro [Ciprofloxacin Hcl] Nausea And Vomiting   Pork-Derived Products Other (See Comments)    No known allergy but does not want to eat it because of religious values   Nitrofuran Derivatives  Anxiety   Wound Dressing Adhesive Itching and Rash    Family History  Problem Relation Age of Onset   Hypertension Mother    Diabetes Mother    Atrial fibrillation Mother    Hypertension Father    Heart attack Father    Dementia Father    Diabetes Brother    Brain cancer Brother    Asthma Daughter    Sickle cell anemia Son    Atrial fibrillation Maternal Uncle     Prior to  Admission medications   Medication Sig Start Date End Date Taking? Authorizing Provider  albuterol (PROVENTIL) (2.5 MG/3ML) 0.083% nebulizer solution Take 3 mLs (2.5 mg total) by nebulization every 6 (six) hours as needed for wheezing or shortness of breath. 01/01/23  Yes Ngetich, Dinah C, NP  albuterol (VENTOLIN HFA) 108 (90 Base) MCG/ACT inhaler INHALE 2 PUFFS BY MOUTH EVERY 6 HOURS AS NEEDED FOR WHEEZING AND FOR SHORTNESS OF BREATH Strength: 108 (90 Base) MCG/ACT Patient taking differently: Inhale 2 puffs into the lungs every 6 (six) hours as needed for shortness of breath. 01/01/23  Yes Ngetich, Dinah C, NP  amLODipine (NORVASC) 5 MG tablet Take 10 mg by mouth in the morning.   Yes [provider]  atorvastatin (LIPITOR) 80 MG tablet Take 1 tablet (80 mg total) by mouth daily. Patient not taking: Reported on 01/19/2023 01/01/23   Ngetich, Dinah C, NP  baclofen (LIORESAL) 10 MG tablet Take 1 tablet (10 mg total) by mouth 3 (three) times daily. 01/08/23  Yes Ngetich, Dinah C, NP  bisacodyl (DULCOLAX) 5 MG EC tablet Take 1 tablet (5 mg total) by mouth daily as needed for moderate constipation. 12/06/22  Yes Jerald Kief, MD  clopidogrel (PLAVIX) 75 MG tablet Take 1 tablet (75 mg total) by mouth daily. 01/01/23  Yes Ngetich, Dinah C, NP  Continuous Glucose Receiver (FREESTYLE LIBRE 2 READER) DEVI 1 Device by Does not apply route daily. E11.69 11/01/22   Ngetich, Dinah C, NP  Continuous Glucose Sensor (FREESTYLE LIBRE 14 DAY SENSOR) MISC 1 each by Does not apply route as needed. DX: E11.69 12/24/22   Ngetich, Dinah C, NP  cyclobenzaprine (FLEXERIL) 5 MG tablet TAKE 1 TABLET BY MOUTH 3 TIMES DAILY AS NEEDED FOR MUSCLE SPASMS 11/18/22  Yes Lovorn, Aundra Millet, MD  diclofenac Sodium (VOLTAREN) 1 % GEL Apply 2 g topically 4 (four) times daily.   Yes [provider]  escitalopram (LEXAPRO) 20 MG tablet Take 1 tablet (20 mg total) by mouth every morning. 01/01/23  Yes Ngetich, Dinah C, NP   ezetimibe (ZETIA) 10 MG tablet Take 1 tablet (10 mg total) by mouth daily. 01/01/23  Yes Ngetich, Dinah C, NP  gabapentin (NEURONTIN) 100 MG capsule Take 100 mg by mouth 3 (three) times daily. 11/14/22  Yes [provider]  insulin glargine (LANTUS SOLOSTAR) 100 UNIT/ML Solostar Pen Inject 20 Units into the skin at bedtime. Patient taking differently: Inject 22 Units into the skin at bedtime. 01/09/23  Yes Ngetich, Dinah C, NP  insulin lispro (HUMALOG) 100 UNIT/ML injection Sliding scale AC, HS CBG 121 - 150: 2 units  CBG 151 - 200: 3 units  CBG 201 - 250: 5 units  CBG 251 - 300: 8 units  CBG 301 - 350: 11 units  CBG 351 - 400: 15 units  CBG > 400: call MD 12/06/22  Yes Jerald Kief, MD  insulin lispro (HUMALOG) 100 UNIT/ML injection Inject 0.04 mLs (4 Units total) into the skin 3 (three)  times daily with meals. 01/01/23   Ngetich, Dinah C, NP  LORazepam (ATIVAN) 0.5 MG tablet Take 1 tablet (0.5 mg total) by mouth at bedtime. 12/06/22  Yes Jerald Kief, MD  methimazole (TAPAZOLE) 5 MG tablet Take 1 tablet (5 mg total) by mouth every morning. 01/01/23  Yes Ngetich, Dinah C, NP  metoCLOPramide (REGLAN) 5 MG tablet Take 1 tablet (5 mg total) by mouth 3 (three) times daily before meals. 01/04/23  Yes Danford, Earl Lites, MD  metoprolol tartrate (LOPRESSOR) 100 MG tablet Take 1 tablet (100 mg total) by mouth 2 (two) times daily. 01/08/23  Yes Ngetich, Dinah C, NP  olmesartan-hydrochlorothiazide (BENICAR HCT) 40-25 MG tablet Take 1 tablet by mouth in the morning.   Yes [provider]  omeprazole (PRILOSEC) 20 MG capsule Take 1 capsule (20 mg total) by mouth daily. 09/02/22  Yes Ngetich, Dinah C, NP  polyethylene glycol powder (GLYCOLAX/MIRALAX) 17 GM/SCOOP powder Take 17 g by mouth daily. Hold for loose stool 01/01/23  Yes Ngetich, Dinah C, NP  ranolazine (RANEXA) 500 MG 12 hr tablet Take 1 tablet (500 mg total) by mouth 2 (two) times daily. 10/09/22 01/07/23  [place], Alvira Philips, DO   rivaroxaban (XARELTO) 10 MG TABS tablet Take 1 tablet (10 mg total) by mouth daily. 01/01/23   Ngetich, Dinah C, NP  tamsulosin (FLOMAX) 0.4 MG CAPS capsule Take 0.4 mg by mouth at bedtime. 11/04/22   [provider]  topiramate (TOPAMAX) 25 MG tablet Take 1 tablet (25 mg total) by mouth 2 (two) times daily. 07/10/22   Sherryll Burger, Pratik D, DO  zinc oxide (BALMEX) 11.3 % CREA cream Apply 1 Application topically 2 (two) times daily.    [provider]    Physical Exam: Vitals:   01/19/23 1305 01/19/23 1330 01/19/23 1430 01/19/23 1500  BP: 124/86 (!) 112/98 126/81 137/89  Pulse: 96 (!) 108 (!) 102 (!) 108  Resp: 16 14 15 20   Temp:      TempSrc:      SpO2: 99% 92% 98% 98%  Weight:      Height:       Physical Exam Vitals reviewed.  Constitutional:      General: She is awake.     Appearance: She is obese. She is ill-appearing.  HENT:     Head: Normocephalic.     Nose: No rhinorrhea.     Mouth/Throat:     Mouth: Mucous membranes are dry.  Eyes:     General: No scleral icterus.    Pupils: Pupils are equal, round, and reactive to light.  Neck:     Vascular: No JVD.  Cardiovascular:     Rate and Rhythm: Regular rhythm. Tachycardia present.     Heart sounds: S1 normal and S2 normal.  Pulmonary:     Effort: Pulmonary effort is normal.     Breath sounds: Normal breath sounds. No wheezing, rhonchi or rales.  Abdominal:     General: Bowel sounds are normal. There is no distension.     Palpations: Abdomen is soft.     Tenderness: There is no abdominal tenderness.  Musculoskeletal:     Cervical back: Neck supple.     Right lower leg: No edema.     Left lower leg: No edema.  Skin:    General: Skin is warm and dry.  Neurological:     Mental Status: She is alert. She is disoriented.  Psychiatric:        Behavior: Behavior is cooperative.  Data Reviewed:  Results are pending, will review when available. 10/07/2022 echocardiogram report IMPRESSIONS:   1. Left  ventricular ejection fraction, by estimation, is 60 to 65%. The  left ventricle has normal function. The left ventricle has no regional  wall motion abnormalities. There is mild concentric left ventricular  hypertrophy. Left ventricular diastolic  parameters were normal.   2. Right ventricular systolic function is normal. The right ventricular  size is normal. There is normal pulmonary artery systolic pressure. The  estimated right ventricular systolic pressure is 21.0 mmHg.   3. The mitral valve is grossly normal. Trivial mitral valve  regurgitation.   4. The aortic valve is tricuspid. There is mild calcification of the  aortic valve. Aortic valve regurgitation is not visualized. Aortic valve  sclerosis/calcification is present, without any evidence of aortic  stenosis. Aortic valve mean gradient  measures 4.0 mmHg.   5. The inferior vena cava is normal in size with greater than 50%  respiratory variability, suggesting right atrial pressure of 3 mmHg.   6. Cannot exclude a small PFO. .  EKG: Vent. rate 105 BPM PR interval 169 ms QRS duration 93 ms QT/QTcB 340/450 ms P-R-T axes 49 70 32 Sinus tachycardia  Assessment and Plan: Principal Problem:   Acute metabolic encephalopathy In the setting of:   Diabetic hyperosmolar non-ketotic state (HCC) Observation/stepdown. Keep NPO. Continue IV fluids. Continue insulin infusion. Monitor CBG closely. BMP every 4 hours. Replace electrolytes as needed. Consult diabetes coordinator. Transition to SQ insulin per Endo tool.  Active Problems:   AKI (acute kidney injury) (HCC) Continue IV fluids. Hold ARB/ACE. Hold diuretic. Avoid hypotension. Avoid nephrotoxins. Monitor intake and output. Monitor renal function electrolytes.    Left hemiplegia (HCC) History of CVA with spastic paresis Continue baclofen as needed.    Chronic diastolic CHF (congestive heart failure) (HCC) No signs of decompensation.    Coronary artery disease  with chronic angina On Ranexa, Xarelto, Lopressor, Plavix and Zetia.    HLD (hyperlipidemia) On ezetimibe 10 mg p.o. daily..    Chronic anemia Monitor hematocrit hemoglobin.    History of DVT (deep vein thrombosis) On Xarelto.    Thrombocytosis In the setting of anemia/chronic anticoagulation.    Obstructive sleep apnea CPAP at bedtime.     Advance Care Planning:   Code Status: Full Code   Consults:   Family Communication:   Severity of Illness: The appropriate patient status for this patient is OBSERVATION. Observation status is judged to be reasonable and necessary in order to provide the required intensity of service to ensure the patient's safety. The patient's presenting symptoms, physical exam findings, and initial radiographic and laboratory data in the context of their medical condition is felt to place them at decreased risk for further clinical deterioration. Furthermore, it is anticipated that the patient will be medically stable for discharge from the hospital within 2 midnights of admission.   Author: Bobette Mo, MD 01/19/2023 4:05 PM  For on call review [email or web address].   This document was prepared using Dragon voice recognition software and may contain some unintended transcription errors.

## 2023-01-19 NOTE — ED Triage Notes (Signed)
BIB EMS from home for BS in the 500s. Husband initially called out for hypoglycemia. Pt takes subcut insulin, but does not check BS at home. Pt is lethargic. Had prior stroke and has left sided deficit. Pt has also been vomiting per husband

## 2023-01-19 NOTE — ED Notes (Signed)
X-ray at bedside

## 2023-01-19 NOTE — ED Notes (Signed)
Pt to ct via stretcher

## 2023-01-19 NOTE — ED Provider Notes (Signed)
[place] EMERGENCY DEPARTMENT AT [facility] Provider Note   CSN: [id number] Arrival date & time: 01/19/23  1112     History  Chief Complaint  Patient presents with   Hyperglycemia    Lori [name] is a 52 y.o. female with past medical history seen for hypothyroidism, hypertension, left-sided hemiplegia, history of CVA with spastic paresis, CAD, pheochromocytoma, hyperlipidemia, aphasia, diabetes, diabetic gastroparesis who presents concern for elevated blood sugar, blood sugar in 500s at home, husband initially called out for her blood sugar, she does not check it at home, patient is lethargic, husband also endorses some vomiting.   Hyperglycemia Associated symptoms: weakness        Home Medications Prior to Admission medications   Medication Sig Start Date End Date Taking? Authorizing Provider  albuterol (PROVENTIL) (2.5 MG/3ML) 0.083% nebulizer solution Take 3 mLs (2.5 mg total) by nebulization every 6 (six) hours as needed for wheezing or shortness of breath. 01/01/23   Ngetich, Dinah C, NP  albuterol (VENTOLIN HFA) 108 (90 Base) MCG/ACT inhaler INHALE 2 PUFFS BY MOUTH EVERY 6 HOURS AS NEEDED FOR WHEEZING AND FOR SHORTNESS OF BREATH Strength: 108 (90 Base) MCG/ACT Patient taking differently: Inhale 2 puffs into the lungs every 6 (six) hours as needed for shortness of breath. 01/01/23   Ngetich, Dinah C, NP  amLODipine (NORVASC) 5 MG tablet Take 10 mg by mouth in the morning.    [provider]  atorvastatin (LIPITOR) 80 MG tablet Take 1 tablet (80 mg total) by mouth daily. 01/01/23   Ngetich, Dinah C, NP  baclofen (LIORESAL) 10 MG tablet Take 1 tablet (10 mg total) by mouth 3 (three) times daily. 01/08/23   Ngetich, Dinah C, NP  bisacodyl (DULCOLAX) 5 MG EC tablet Take 1 tablet (5 mg total) by mouth daily as needed for moderate constipation. Patient not taking: Reported on 12/27/2022 12/06/22   Jerald Kief, MD  clopidogrel  (PLAVIX) 75 MG tablet Take 1 tablet (75 mg total) by mouth daily. 01/01/23   Ngetich, Donalee Citrin, NP  Continuous Glucose Receiver (FREESTYLE LIBRE 2 READER) DEVI 1 Device by Does not apply route daily. E11.69 11/01/22   Ngetich, Dinah C, NP  Continuous Glucose Sensor (FREESTYLE LIBRE 14 DAY SENSOR) MISC 1 each by Does not apply route as needed. DX: E11.69 12/24/22   Ngetich, Dinah C, NP  cyclobenzaprine (FLEXERIL) 5 MG tablet TAKE 1 TABLET BY MOUTH 3 TIMES DAILY AS NEEDED FOR MUSCLE SPASMS 11/18/22   Lovorn, Aundra Millet, MD  diclofenac Sodium (VOLTAREN) 1 % GEL Apply 2 g topically 4 (four) times daily.    [provider]  escitalopram (LEXAPRO) 20 MG tablet Take 1 tablet (20 mg total) by mouth every morning. 01/01/23   Ngetich, Dinah C, NP  ezetimibe (ZETIA) 10 MG tablet Take 1 tablet (10 mg total) by mouth daily. 01/01/23   Ngetich, Dinah C, NP  gabapentin (NEURONTIN) 100 MG capsule Take 100 mg by mouth 3 (three) times daily. 11/14/22   [provider]  insulin glargine (LANTUS SOLOSTAR) 100 UNIT/ML Solostar Pen Inject 20 Units into the skin at bedtime. 01/09/23   Ngetich, Dinah C, NP  insulin lispro (HUMALOG) 100 UNIT/ML injection Sliding scale AC, HS CBG 121 - 150: 2 units  CBG 151 - 200: 3 units  CBG 201 - 250: 5 units  CBG 251 - 300: 8 units  CBG 301 - 350: 11 units  CBG 351 - 400: 15 units  CBG > 400: call MD  12/06/22   Jerald Kief, MD  insulin lispro (HUMALOG) 100 UNIT/ML injection Inject 0.04 mLs (4 Units total) into the skin 3 (three) times daily with meals. 01/01/23   Ngetich, Dinah C, NP  LORazepam (ATIVAN) 0.5 MG tablet Take 1 tablet (0.5 mg total) by mouth at bedtime. 12/06/22   Jerald Kief, MD  methimazole (TAPAZOLE) 5 MG tablet Take 1 tablet (5 mg total) by mouth every morning. 01/01/23   Ngetich, Dinah C, NP  metoCLOPramide (REGLAN) 5 MG tablet Take 1 tablet (5 mg total) by mouth 3 (three) times daily before meals. 01/04/23   Danford, Earl Lites, MD  metoprolol  tartrate (LOPRESSOR) 100 MG tablet Take 1 tablet (100 mg total) by mouth 2 (two) times daily. 01/08/23   Ngetich, Dinah C, NP  olmesartan-hydrochlorothiazide (BENICAR HCT) 40-25 MG tablet Take 1 tablet by mouth in the morning.    [provider]  omeprazole (PRILOSEC) 20 MG capsule Take 1 capsule (20 mg total) by mouth daily. 09/02/22   Ngetich, Dinah C, NP  polyethylene glycol powder (GLYCOLAX/MIRALAX) 17 GM/SCOOP powder Take 17 g by mouth daily. Hold for loose stool 01/01/23   Ngetich, Dinah C, NP  ranolazine (RANEXA) 500 MG 12 hr tablet Take 1 tablet (500 mg total) by mouth 2 (two) times daily. 10/09/22 01/07/23  [place], Alvira Philips, DO  rivaroxaban (XARELTO) 10 MG TABS tablet Take 1 tablet (10 mg total) by mouth daily. 01/01/23   Ngetich, Dinah C, NP  tamsulosin (FLOMAX) 0.4 MG CAPS capsule Take 0.4 mg by mouth at bedtime. 11/04/22   [provider]  topiramate (TOPAMAX) 25 MG tablet Take 1 tablet (25 mg total) by mouth 2 (two) times daily. 07/10/22   Sherryll Burger, Pratik D, DO  zinc oxide (BALMEX) 11.3 % CREA cream Apply 1 Application topically 2 (two) times daily.    [provider]      Allergies    Asa [aspirin], Contrast media [iodinated contrast media], Imitrex [sumatriptan], Ms contin [morphine], Penicillins, Firvanq [vancomycin], Cipro [ciprofloxacin hcl], Pork-derived products, Nitrofuran derivatives, and Wound dressing adhesive    Review of Systems   Review of Systems  Neurological:  Positive for weakness.  All other systems reviewed and are negative.   Physical Exam Updated Vital Signs BP 137/89   Pulse (!) 108   Temp 97.7 F (36.5 C) (Oral)   Resp 20   Ht 5\' 2"  (1.575 m)   Wt 88.4 kg   SpO2 98%   BMI 35.65 kg/m  Physical Exam Vitals and nursing note reviewed.  Constitutional:      General: She is not in acute distress.    Appearance: Normal appearance. She is ill-appearing.  HENT:     Head: Normocephalic and atraumatic.  Eyes:     General:        Right  eye: No discharge.        Left eye: No discharge.  Cardiovascular:     Rate and Rhythm: Regular rhythm. Tachycardia present.     Heart sounds: No murmur heard.    No friction rub. No gallop.  Pulmonary:     Effort: Pulmonary effort is normal.     Breath sounds: Normal breath sounds.  Abdominal:     General: Bowel sounds are normal.     Palpations: Abdomen is soft.     Comments: Obese but no tenderness to palpation of the abdomen  Skin:    General: Skin is warm and dry.     Capillary Refill: Capillary  refill takes less than 2 seconds.  Neurological:     Mental Status: She is alert. She is disoriented.  Psychiatric:        Mood and Affect: Mood normal.        Behavior: Behavior normal.     ED Results / Procedures / Treatments   Labs (all labs ordered are listed, but only abnormal results are displayed) Labs Reviewed  CBC - Abnormal; Notable for the following components:      Result Value   Hemoglobin 10.8 (*)    HCT 33.3 (*)    MCV 75.9 (*)    MCH 24.6 (*)    RDW 15.9 (*)    Platelets 455 (*)    All other components within normal limits  COMPREHENSIVE METABOLIC PANEL - Abnormal; Notable for the following components:   Sodium 123 (*)    Chloride 85 (*)    Glucose, Bld 503 (*)    BUN 47 (*)    Creatinine, Ser 2.15 (*)    AST 11 (*)    GFR, Estimated 27 (*)    All other components within normal limits  BLOOD GAS, VENOUS - Abnormal; Notable for the following components:   pO2, Ven <31 (*)    Bicarbonate 33.1 (*)    Acid-Base Excess 6.3 (*)    All other components within normal limits  BETA-HYDROXYBUTYRIC ACID - Abnormal; Notable for the following components:   Beta-Hydroxybutyric Acid 0.28 (*)    All other components within normal limits  CBG MONITORING, ED - Abnormal; Notable for the following components:   Glucose-Capillary 482 (*)    All other components within normal limits  CBG MONITORING, ED - Abnormal; Notable for the following components:    Glucose-Capillary 348 (*)    All other components within normal limits  RESP PANEL BY RT-PCR (RSV, FLU A&B, COVID)  [id number]  URINALYSIS, ROUTINE W REFLEX MICROSCOPIC  BASIC METABOLIC PANEL  BASIC METABOLIC PANEL  BASIC METABOLIC PANEL  OSMOLALITY    EKG EKG Interpretation Date/Time:  Sunday January 19 2023 11:24:59 EST Ventricular Rate:  105 PR Interval:  169 QRS Duration:  93 QT Interval:  340 QTC Calculation: 450 R Axis:   70  Text Interpretation: Sinus tachycardia Since last tracing rate slower Confirmed by Jacalyn Lefevre [id number]) on 01/19/2023 11:46:37 AM  Radiology CT Head Wo Contrast Result Date: 01/19/2023 CLINICAL DATA:  51 year old female with history of altered mental status. EXAM: CT HEAD WITHOUT CONTRAST TECHNIQUE: Contiguous axial images were obtained from the base of the skull through the vertex without intravenous contrast. RADIATION DOSE REDUCTION: This exam was performed according to the departmental dose-optimization program which includes automated exposure control, adjustment of the mA and/or kV according to patient size and/or use of iterative reconstruction technique. COMPARISON:  Head CT 11/22/2022. FINDINGS: Brain: Mild cerebral and cerebellar atrophy. Patchy and confluent areas of decreased attenuation are noted throughout the deep and periventricular white matter of the cerebral hemispheres bilaterally, compatible with chronic microvascular ischemic disease. Multiple more well-defined areas of low attenuation in the basal ganglia bilaterally (right greater than left), similar to prior studies, compatible with old lacunar infarcts. There is also well-defined low-attenuation in the left frontal lobe and the left cerebellar hemisphere, compatible with areas of encephalomalacia from remote infarcts, similar to the prior study. No evidence of acute infarction, hemorrhage, hydrocephalus, extra-axial collection or mass lesion/mass effect. Vascular: No hyperdense vessel or  unexpected calcification. Skull: Normal. Negative for fracture or focal lesion. Sinuses/Orbits: No acute finding. Other:  None. IMPRESSION: 1. No acute intracranial abnormalities. 2. Mild cerebral and cerebellar atrophy with extensive chronic microvascular ischemic changes in the cerebral white matter, old basal ganglia lacunar infarcts bilaterally, and old left frontal and left cerebellar hemisphere infarcts, similar to prior examination. Electronically Signed   By: Trudie Reed M.D.   On: 01/19/2023 12:13   DG Chest Portable 1 View Result Date: 01/19/2023 CLINICAL DATA:  52 year old female with shortness of breath, hypoglycemia, lethargy. EXAM: PORTABLE CHEST 1 VIEW COMPARISON:  Portable chest 12/25/2022 and earlier. FINDINGS: Portable AP semi upright view at 1138 hours. Low lung volumes, similar to prior. Chronic sternotomy. Mediastinal contours remain within normal limits. Visualized tracheal air column is within normal limits. Allowing for portable technique the lungs are clear. Evidence of right lung azygous vein and fissure (normal variant). Negative visible bowel gas. IMPRESSION: Low lung volumes, otherwise no acute cardiopulmonary abnormality. Electronically Signed   By: Odessa Fleming M.D.   On: 01/19/2023 12:09    Procedures Procedures    Medications Ordered in ED Medications  insulin regular, human (MYXREDLIN) 100 units/ 100 mL infusion (11 Units/hr Intravenous New Bag/Given 01/19/23 1431)  lactated ringers infusion ( Intravenous New Bag/Given 01/19/23 1507)  dextrose 50 % solution 0-50 mL (has no administration in time range)  sodium chloride flush (NS) 0.9 % injection 10 mL (10 mLs Intravenous Given 01/19/23 1409)  sodium chloride 0.9 % bolus 1,000 mL (0 mLs Intravenous Stopped 01/19/23 1321)  insulin aspart (novoLOG) injection 8 Units (8 Units Intravenous Given 01/19/23 1321)  lactated ringers bolus 1,768 mL (1,768 mLs Intravenous New Bag/Given 01/19/23 1420)  ondansetron (ZOFRAN)  injection 4 mg (4 mg Intravenous Given 01/19/23 1420)    ED Course/ Medical Decision Making/ A&P Clinical Course as of 01/19/23 1508  Sun Jan 19, 2023  1352 Hyponatremia even after correction, sodium of 123 [CP]    Clinical Course User Index [CP] Olene Floss, PA-C                                 Medical Decision Making  This patient is a 52 y.o. female who presents to the ED for concern of weakness, confusion, hypergylcemia, this involves an extensive number of treatment options, and is a complaint that carries with it a high risk of complications and morbidity. The emergent differential diagnosis prior to evaluation includes, but is not limited to,  CVA, spinal cord injury, ACS, arrhythmia, syncope, orthostatic hypotension, sepsis, hypoglycemia, hypoxia, electrolyte disturbance, endocrine disorder, anemia, environmental exposure, polypharmacy . This is not an exhaustive differential.   Past Medical History / Co-morbidities / Social History: hypothyroidism, hypertension, left-sided hemiplegia, history of CVA with spastic paresis, CAD, pheochromocytoma, hyperlipidemia, aphasia, diabetes, diabetic gastroparesis  Additional history: Chart reviewed. Pertinent results include: Reviewed lab work, imaging from recent previous hospitalizations, admission, notably she was discharged on 11/30 after admission for gastroparesis flare, additionally reviewed recent cardiac echo, 60 to 65% ejection fraction at most recent echo in September of this year  Physical Exam: Physical exam performed. The pertinent findings include: Patient with hemiplegia, dysarthria which are reportedly her baseline, on initial evaluation she is mildly tachycardic, appears dry with dry mucous membranes,  Lab Tests: I ordered, and personally interpreted labs.  The pertinent results include: CMP notable for hyponatremia, sodium 123, even after correction for hyperglycemia sodium around 129, glucose 503, she is  hypochloremic, chloride 85, she has no anion gap, but significant elevation of BUN,  creatinine and BUN 47, creatinine 2.15 with AKI from baseline of recent 0.75 2 weeks ago.  CBC with anemia not significantly worse than baseline, hemoglobin 10.8, platelets 455, no leukocytosis.  VBG without acidosis, beta hydroxybutyrate is mildly elevated at 0.28.   Imaging Studies: I ordered imaging studies including plain film chest x-ray, CT head without contrast. I independently visualized and interpreted imaging which showed low lung volumes, and significant chronic changes noted related to her previous CVA, with atrophy throughout but no evidence of new stroke, new bleed, or new mass in the cranium. I agree with the radiologist interpretation.   Cardiac Monitoring:  The patient was maintained on a cardiac monitor.  My attending physician Dr. Particia Nearing viewed and interpreted the cardiac monitored which showed an underlying rhythm of: sinus tachycardia. I agree with this interpretation.   Medications: I ordered medication including insulin, fluid bolus for dehydration, tachycardia, hyperglycemia.  On reevaluation tachycardia has worsened, and patient seems to be more encephalopathic than on arrival, will begin HHS protocol for insulin drip, patient will need admission for AKI, hyponatremia, encephalopathy  Consultations Obtained: I requested consultation with the hospitalist, spoke with Dr. Robb Matar,  and discussed lab and imaging findings as well as pertinent plan - they recommend: hospital admission for encephalopathy, AKI, hyperglycemia   Disposition: After consideration of the diagnostic results and the patients response to treatment, I feel that patient would benefit from admission as above .   I discussed this case with my attending physician Dr. Particia Nearing who cosigned this note including patient's presenting symptoms, physical exam, and planned diagnostics and interventions. Attending physician stated  agreement with plan or made changes to plan which were implemented.    Final Clinical Impression(s) / ED Diagnoses Final diagnoses:  Hyperglycemia  Encephalopathy, unspecified type  AKI (acute kidney injury) [facility])    Rx / DC Orders ED Discharge Orders     None         [place] 01/19/23 1508    Jacalyn Lefevre, MD 01/23/23 [phone]

## 2023-01-20 ENCOUNTER — Observation Stay (HOSPITAL_COMMUNITY): Payer: 59

## 2023-01-20 DIAGNOSIS — I69354 Hemiplegia and hemiparesis following cerebral infarction affecting left non-dominant side: Secondary | ICD-10-CM | POA: Diagnosis not present

## 2023-01-20 DIAGNOSIS — G3189 Other specified degenerative diseases of nervous system: Secondary | ICD-10-CM | POA: Diagnosis not present

## 2023-01-20 DIAGNOSIS — R739 Hyperglycemia, unspecified: Secondary | ICD-10-CM | POA: Diagnosis present

## 2023-01-20 DIAGNOSIS — I6782 Cerebral ischemia: Secondary | ICD-10-CM | POA: Diagnosis not present

## 2023-01-20 DIAGNOSIS — R531 Weakness: Secondary | ICD-10-CM | POA: Diagnosis not present

## 2023-01-20 DIAGNOSIS — Z8673 Personal history of transient ischemic attack (TIA), and cerebral infarction without residual deficits: Secondary | ICD-10-CM | POA: Diagnosis not present

## 2023-01-20 DIAGNOSIS — G9341 Metabolic encephalopathy: Secondary | ICD-10-CM | POA: Diagnosis present

## 2023-01-20 DIAGNOSIS — D649 Anemia, unspecified: Secondary | ICD-10-CM | POA: Diagnosis present

## 2023-01-20 DIAGNOSIS — I7389 Other specified peripheral vascular diseases: Secondary | ICD-10-CM | POA: Diagnosis present

## 2023-01-20 DIAGNOSIS — E860 Dehydration: Secondary | ICD-10-CM | POA: Diagnosis present

## 2023-01-20 DIAGNOSIS — R29818 Other symptoms and signs involving the nervous system: Secondary | ICD-10-CM | POA: Diagnosis not present

## 2023-01-20 DIAGNOSIS — G4733 Obstructive sleep apnea (adult) (pediatric): Secondary | ICD-10-CM | POA: Diagnosis present

## 2023-01-20 DIAGNOSIS — Z794 Long term (current) use of insulin: Secondary | ICD-10-CM | POA: Diagnosis not present

## 2023-01-20 DIAGNOSIS — N179 Acute kidney failure, unspecified: Secondary | ICD-10-CM | POA: Diagnosis present

## 2023-01-20 DIAGNOSIS — R4701 Aphasia: Secondary | ICD-10-CM | POA: Diagnosis not present

## 2023-01-20 DIAGNOSIS — Z7401 Bed confinement status: Secondary | ICD-10-CM | POA: Diagnosis not present

## 2023-01-20 DIAGNOSIS — K219 Gastro-esophageal reflux disease without esophagitis: Secondary | ICD-10-CM | POA: Diagnosis present

## 2023-01-20 DIAGNOSIS — G319 Degenerative disease of nervous system, unspecified: Secondary | ICD-10-CM | POA: Diagnosis not present

## 2023-01-20 DIAGNOSIS — E785 Hyperlipidemia, unspecified: Secondary | ICD-10-CM | POA: Diagnosis present

## 2023-01-20 DIAGNOSIS — Z923 Personal history of irradiation: Secondary | ICD-10-CM | POA: Diagnosis not present

## 2023-01-20 DIAGNOSIS — Z8249 Family history of ischemic heart disease and other diseases of the circulatory system: Secondary | ICD-10-CM | POA: Diagnosis not present

## 2023-01-20 DIAGNOSIS — Z7901 Long term (current) use of anticoagulants: Secondary | ICD-10-CM | POA: Diagnosis not present

## 2023-01-20 DIAGNOSIS — F32A Depression, unspecified: Secondary | ICD-10-CM | POA: Diagnosis present

## 2023-01-20 DIAGNOSIS — R81 Glycosuria: Secondary | ICD-10-CM | POA: Diagnosis present

## 2023-01-20 DIAGNOSIS — Z743 Need for continuous supervision: Secondary | ICD-10-CM | POA: Diagnosis not present

## 2023-01-20 DIAGNOSIS — E896 Postprocedural adrenocortical (-medullary) hypofunction: Secondary | ICD-10-CM | POA: Diagnosis present

## 2023-01-20 DIAGNOSIS — E1100 Type 2 diabetes mellitus with hyperosmolarity without nonketotic hyperglycemic-hyperosmolar coma (NKHHC): Secondary | ICD-10-CM | POA: Diagnosis not present

## 2023-01-20 DIAGNOSIS — E66812 Obesity, class 2: Secondary | ICD-10-CM | POA: Diagnosis present

## 2023-01-20 DIAGNOSIS — Z86718 Personal history of other venous thrombosis and embolism: Secondary | ICD-10-CM | POA: Diagnosis not present

## 2023-01-20 DIAGNOSIS — E871 Hypo-osmolality and hyponatremia: Secondary | ICD-10-CM | POA: Diagnosis present

## 2023-01-20 DIAGNOSIS — E890 Postprocedural hypothyroidism: Secondary | ICD-10-CM | POA: Diagnosis present

## 2023-01-20 DIAGNOSIS — I5032 Chronic diastolic (congestive) heart failure: Secondary | ICD-10-CM | POA: Diagnosis present

## 2023-01-20 DIAGNOSIS — Z20822 Contact with and (suspected) exposure to covid-19: Secondary | ICD-10-CM | POA: Diagnosis present

## 2023-01-20 DIAGNOSIS — E1143 Type 2 diabetes mellitus with diabetic autonomic (poly)neuropathy: Secondary | ICD-10-CM | POA: Diagnosis present

## 2023-01-20 DIAGNOSIS — I110 Hypertensive heart disease with heart failure: Secondary | ICD-10-CM | POA: Diagnosis present

## 2023-01-20 LAB — GLUCOSE, CAPILLARY
Glucose-Capillary: 123 mg/dL — ABNORMAL HIGH (ref 70–99)
Glucose-Capillary: 130 mg/dL — ABNORMAL HIGH (ref 70–99)
Glucose-Capillary: 131 mg/dL — ABNORMAL HIGH (ref 70–99)
Glucose-Capillary: 137 mg/dL — ABNORMAL HIGH (ref 70–99)
Glucose-Capillary: 139 mg/dL — ABNORMAL HIGH (ref 70–99)
Glucose-Capillary: 144 mg/dL — ABNORMAL HIGH (ref 70–99)
Glucose-Capillary: 153 mg/dL — ABNORMAL HIGH (ref 70–99)
Glucose-Capillary: 156 mg/dL — ABNORMAL HIGH (ref 70–99)
Glucose-Capillary: 172 mg/dL — ABNORMAL HIGH (ref 70–99)
Glucose-Capillary: 224 mg/dL — ABNORMAL HIGH (ref 70–99)
Glucose-Capillary: 293 mg/dL — ABNORMAL HIGH (ref 70–99)
Glucose-Capillary: 99 mg/dL (ref 70–99)

## 2023-01-20 LAB — CBC
HCT: 30.5 % — ABNORMAL LOW (ref 36.0–46.0)
Hemoglobin: 10.1 g/dL — ABNORMAL LOW (ref 12.0–15.0)
MCH: 24.6 pg — ABNORMAL LOW (ref 26.0–34.0)
MCHC: 33.1 g/dL (ref 30.0–36.0)
MCV: 74.2 fL — ABNORMAL LOW (ref 80.0–100.0)
Platelets: 440 10*3/uL — ABNORMAL HIGH (ref 150–400)
RBC: 4.11 MIL/uL (ref 3.87–5.11)
RDW: 15.5 % (ref 11.5–15.5)
WBC: 10.2 10*3/uL (ref 4.0–10.5)
nRBC: 0 % (ref 0.0–0.2)

## 2023-01-20 LAB — COMPREHENSIVE METABOLIC PANEL
ALT: 11 U/L (ref 0–44)
AST: 16 U/L (ref 15–41)
Albumin: 3.2 g/dL — ABNORMAL LOW (ref 3.5–5.0)
Alkaline Phosphatase: 61 U/L (ref 38–126)
Anion gap: 7 (ref 5–15)
BUN: 25 mg/dL — ABNORMAL HIGH (ref 6–20)
CO2: 25 mmol/L (ref 22–32)
Calcium: 9 mg/dL (ref 8.9–10.3)
Chloride: 101 mmol/L (ref 98–111)
Creatinine, Ser: 0.97 mg/dL (ref 0.44–1.00)
GFR, Estimated: >60 mL/min (ref >60)
Glucose, Bld: 137 mg/dL — ABNORMAL HIGH (ref 70–99)
Potassium: 4.2 mmol/L (ref 3.5–5.1)
Sodium: 133 mmol/L — ABNORMAL LOW (ref 135–145)
Total Bilirubin: 0.3 mg/dL (ref <1.2)
Total Protein: 6.7 g/dL (ref 6.5–8.1)

## 2023-01-20 MED ORDER — INSULIN GLARGINE-YFGN 100 UNIT/ML ~~LOC~~ SOLN
5.0000 [IU] | Freq: Once | SUBCUTANEOUS | Status: AC
  Administered 2023-01-20: 5 [IU] via SUBCUTANEOUS
  Filled 2023-01-20 (×2): qty 0.05

## 2023-01-20 MED ORDER — INSULIN ASPART 100 UNIT/ML IJ SOLN
0.0000 [IU] | Freq: Three times a day (TID) | INTRAMUSCULAR | Status: DC
  Administered 2023-01-20: 3 [IU] via SUBCUTANEOUS
  Administered 2023-01-20: 8 [IU] via SUBCUTANEOUS
  Administered 2023-01-21: 2 [IU] via SUBCUTANEOUS
  Administered 2023-01-21: 3 [IU] via SUBCUTANEOUS

## 2023-01-20 MED ORDER — SODIUM CHLORIDE 0.9% FLUSH: 3.0000 mL | INTRAVENOUS | Status: DC | PRN

## 2023-01-20 MED ORDER — LORAZEPAM 2 MG/ML IJ SOLN
1.0000 mg | Freq: Once | INTRAMUSCULAR | Status: DC
  Filled 2023-01-20 (×2): qty 1

## 2023-01-20 MED ORDER — INSULIN GLARGINE-YFGN 100 UNIT/ML ~~LOC~~ SOLN
22.0000 [IU] | Freq: Every day | SUBCUTANEOUS | Status: DC
  Administered 2023-01-20: 22 [IU] via SUBCUTANEOUS
  Filled 2023-01-20 (×2): qty 0.22

## 2023-01-20 MED ORDER — SODIUM CHLORIDE 0.9 % IV SOLN: 250.0000 mL | INTRAVENOUS | Status: AC | PRN

## 2023-01-20 MED ORDER — INSULIN ASPART 100 UNIT/ML IJ SOLN
0.0000 [IU] | Freq: Every day | INTRAMUSCULAR | Status: DC
  Administered 2023-01-20: 2 [IU] via SUBCUTANEOUS

## 2023-01-20 MED ORDER — DIPHENHYDRAMINE HCL 50 MG/ML IJ SOLN
INTRAMUSCULAR | Status: AC
  Filled 2023-01-20: qty 1

## 2023-01-20 MED ORDER — DIPHENHYDRAMINE HCL 50 MG/ML IJ SOLN
50.0000 mg | Freq: Once | INTRAMUSCULAR | Status: AC
  Administered 2023-01-20: 50 mg via INTRAVENOUS

## 2023-01-20 MED ORDER — SODIUM CHLORIDE 0.9% FLUSH
3.0000 mL | Freq: Two times a day (BID) | INTRAVENOUS | Status: DC
  Administered 2023-01-20 (×2): 3 mL via INTRAVENOUS

## 2023-01-20 NOTE — Progress Notes (Signed)
Stroke Alert cart activation at [phone]. Provider, Dr Hughie Closs, at bedside at [facility] per staff. Recognition of symptoms 0745. New onset right upper extremity weakness and right hand numbness and unspecified vision changes. MRS 4. [facility]ist paged at [phone]. Patient to CT department at 0815. [facility]rologist called at [phone] to validate page received. [id number] Dr Brooke Dare, joins the telemedicine cart. Patient expedited to MRI at 0840 direct from CT department. No TNK and TSRN dismissed by Neurologist at [phone]. Telestroke Charity fundraiser, Wilhelmenia Blase

## 2023-01-20 NOTE — Progress Notes (Signed)
  This encounter was created in error - please disregard. No show 

## 2023-01-20 NOTE — Progress Notes (Signed)
This encounter was created in error - please disregard.

## 2023-01-20 NOTE — Plan of Care (Signed)
  Problem: Clinical Measurements: Goal: Respiratory complications will improve Outcome: Progressing   Problem: Nutrition: Goal: Adequate nutrition will be maintained Outcome: Progressing   Problem: Safety: Goal: Ability to remain free from injury will improve Outcome: Progressing   Problem: Tissue Perfusion: Goal: Adequacy of tissue perfusion will improve Outcome: Progressing   Problem: Respiratory: Goal: Will regain and/or maintain adequate ventilation Outcome: Progressing   Problem: Urinary Elimination: Goal: Ability to achieve and maintain adequate renal perfusion and functioning will improve Outcome: Progressing

## 2023-01-20 NOTE — Consult Note (Addendum)
[facility] Telemedicine Consult   Requesting Provider: Hughie Closs Consult Participants: Myself, bedside nurse Britt Boozer, atrium nurse Elease Hashimoto, patient, daughter Catie by phone Location of the provider: [facility] Location of the patient: [facility]  This consult was provided via telemedicine with 2-way video and audio communication. The patient/family was informed that care would be provided in this way and agreed to receive care in this manner.   Chief Complaint: Right sided facial droop and arm weakness/tingling  HPI: This is a 52 year old woman with a past medical history significant for diabetes, hypertension, prior strokes with residual left-sided weakness as well as aphasia/dysarthria, pheochromocytoma, hypothyroidism, CABG  She was admitted on 12/15, presenting with hyperglycemia and lethargy as well as emesis.  Per medication reconciliation her last Xarelto dose was on 12/14 in the morning, but unclear exact timing of this dose, and I am unable to verify with patient or reach family to can confirm this dosing.  Potentially she may have missed her Sunday morning dose due to emesis  Patient self reports she was well at 7 AM but then began to have a headache at 7:20 AM.  Subsequently right sided weakness and numbness were recognized at 7:45 AM at which time code stroke was activated.  She reports to me she has not had symptoms on the right side before, however she was last seen by my colleague on 9/30 with right sided symptoms felt to be secondary to symptomatic hypotension.  She was also seen by my colleague in 01/2021 with fluctuating aphasia and mutism.  MRI and EEG were negative and etiology was felt to be recrudescence of prior strokes in the setting of GI illness  08/2017 hospitalization summary mentions MRI BRAIN: 1. Subtle area of abnormal enhancement along the right lateral brainstem could be vascular in etiology or relate to left meningeal spread of malignancy as there  is a history of intracranial malignancy. 2. Mass in the left orbital cavity could represent a metastatic focus or perhaps a primarily neoplasm. Would consider dedicated imaging of the orbits or correlation with prior orbital imaging. MRI without and with contrast could be performed. 3. Periventricular white matter disease could relate to therapy related changes.   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  Additionally unable to fully confirm last Xarelto dose IR Thrombectomy? No, exam not consistent with new LVO Modified Rankin Scale: 4-Needs assistance to walk and tend to bodily needs Time of teleneurologist evaluation: 8:25 AM  Exam: Vitals:   01/20/23 0600 01/20/23 0700  BP: (!) 148/89 (!) 141/84  Pulse: 95 (!) 101  Resp: 16 18  Temp:  98 F (36.7 C)  SpO2: 96% 99%    General: No acute distress Pulmonary: breathing comfortably  NIH Stroke scale 1A: Level of Consciousness - 0 1B: Ask Month and Age - 0 1C: 'Blink Eyes' & 'Squeeze Hands' - 0 2: Test Horizontal Extraocular Movements - 0 3: Test Visual Fields - 0 4: Test Facial Palsy - 0 5A: Test Left Arm Motor Drift - 0 contracted at the hand, but able to maintain the arm antigravity without drift with encouragement 5B: Test Right Arm  Motor Drift - 1 possible slight drift versus poor attention/concentration 6A: Test Left Leg Motor Drift - 0  6B: Test Right Leg Motor Drift - 0 7: Test Limb Ataxia - 0  8: Test Sensation - 1 tingling on the right side 9: Test Language/Aphasia- 1 10: Test Dysarthria - 1 11: Test Extinction/Inattention -  0 NIHSS score: 4   Imaging Reviewed:   Head CT personally reviewed, agree with radiology no acute intracranial process  MRI personally reviewed, no signs of stroke large enough to be clinically significant on my review, slightly motion exam Only DWI images were obtained due to patient inability to tolerate full scan Benadryl administered by primary team prior to scan per patient request for this medication for scanning  Labs reviewed in epic and pertinent values follow:  Basic Metabolic Panel: Recent Labs  Lab 01/19/23 1230 01/19/23 1827 01/19/23 2205 01/20/23 0351  NA 123* 130* 134* 133*  K 5.1 4.8 4.2 4.2  CL 85* 98 102 101  CO2 26 19* 25 25  GLUCOSE 503* 243* 178* 137*  BUN 47* 36* 30* 25*  CREATININE 2.15* 1.50* 1.16* 0.97  CALCIUM 9.3 9.1 9.2 9.0    CBC: Recent Labs  Lab 01/19/23 1230 01/20/23 0351  WBC 8.6 10.2  HGB 10.8* 10.1*  HCT 33.3* 30.5*  MCV 75.9* 74.2*  PLT 455* 440*    Coagulation Studies: No results for input(s): "LABPROT", "INR" in the last 72 hours.    Assessment: Right sided tingling/weakness.  Favor this to be related to her headache given negative MRI (although radiology report is pending at the time of this note)  Emergent recommendations: -CTA head and neck not obtained due to allergy of anaphylaxis to iodinated dye -Emergent MRI obtained to assess for any significant new stroke  Recommendations:  -Supportive care for her headache per primary team -Neurology will follow-up MRI report and if this is confirmed to be a negative study, we will sign off -Discussed with Dr. Jacqulyn Bath via phone  This patient is receiving care for possible acute neurological changes. There was 45 minutes of care by this provider at the time of service, including time for direct evaluation via telemedicine, review of medical records, imaging studies and discussion of findings with providers, the patient and/or family.  Brooke Dare MD-PhD [facility] [phone]   If 8pm-8am, please page neurology on call as listed in AMION.  CRITICAL CARE Performed by: Gordy Councilman   Total critical care time: 45 minutes  Critical care time was exclusive of separately billable procedures and treating other patients.  Critical care was necessary to treat or prevent imminent or life-threatening deterioration - emergent evaluation for consideration of thrombolytic or thrombectomy  Critical care was time spent personally by me on the following activities: development of treatment plan with patient and/or surrogate as well as nursing, discussions with consultants, evaluation of patient's response to treatment, examination of patient, obtaining history from patient or surrogate, ordering and performing treatments and interventions, ordering and review of laboratory studies, ordering and review of radiographic studies, pulse oximetry and re-evaluation of patient's condition.

## 2023-01-20 NOTE — Progress Notes (Signed)
PROGRESS NOTE    Monie Strozewski [name]  QMV:[id number] DOB: April 08, 1970 DOA: 01/19/2023 PCP: Caesar Bookman, NP   Brief Narrative:  HPI: Debroah Loop [name] is a 52 y.o. female with medical history significant of CAD/CABG, depression, anxiety, type 2 diabetes, hyperlipidemia, diabetic gastroparesis, history of DVT, hypertension, pheochromocytoma, hypothyroidism, post ablation hypothyroidism, class II obesity, history of CVA with left-sided spastic hemiparesis who presented to the emergency department with complaints of persistent nausea and emesis. She was recently admitted from 12/25/2022 until 12/28/2022 with diabetic gastroparesis.  She has also been mated with similar symptoms like today in the setting of hyperglycemic state with varying degrees of encephalopathy requiring insulin infusion.  She is unable to provide further information at this time..    Lab work: Urine analysis show glucosuria with rare bacteria.  Negative coronavirus, influenza and RSV PCR.  CBC 8.6, hemoglobin 10.8 g/dL platelets 284.  Venous blood gas shows PaO2 less than 31 mmHg, HCO3 33.1 and acid-base acid 6.3 mmol/L.  pH, pCO2 and O2 saturation were normal.  CMP showed glucose of 503, BUN of 47 and creatinine of 2.15 mg/deciliter.  Corrected sodium 133 mmol/L.  Normal potassium and CO2 with a normal anion gap.  Calcium is normal.  LFTs were unremarkable.  Beta hydroxybutyric acid 0.28.   Imaging: Low lung volumes, otherwise no acute pulmonary abnormality.  CT head without contrast with no acute intracranial normality.  There is mild cerebral and cerebellar atrophy with extensive chronic microvascular ischemic changes in the white matter.  Old infarcts.   ED course: Initial vital signs were temperature 97.7 F, pulse 110, respirations 16, BP 103/75 mmHg O2 sat 100% on room air.  The patient received insulin 8 units SQ x 1, LR 1768 mL bolus, ondansetron 4 mg IVP and 1000 mL normal saline  bolus.  Assessment & Plan:   Principal Problem:   Diabetic hyperosmolar non-ketotic state (HCC) Active Problems:   AKI (acute kidney injury) (HCC)   History of CVA with spastic paresis (cerebrovascular accident)   Essential hypertension   Left hemiplegia (HCC)   Coronary artery disease with chronic angina   Chronic diastolic CHF (congestive heart failure) (HCC)   Diabetic gastroparesis (HCC)   CAD (coronary artery disease)   HLD (hyperlipidemia)   GERD (gastroesophageal reflux disease)   Chronic anemia   Acute metabolic encephalopathy   History of DVT (deep vein thrombosis)   Thrombocytosis   Obstructive sleep apnea  Acute metabolic encephalopathy: Likely in the setting of diabetic HHS, fully alert and oriented currently.    Diabetic hyperosmolar non-ketotic state (HCC) Resolved after starting on insulin infusion.  She has been weaned off of insulin and currently only on SSI.  She takes 22 units of Lantus at home, was given only 5 units, blood sugar is currently controlled so I will resume 22 units starting tonight and watch closely.    AKI (acute kidney injury) (HCC)/dehydration Secondary to HHS.  Resolved.  Renal function back to normal now.     Left hemiplegia (HCC) History of CVA with spastic paresis/complains of new right-sided weakness and tingling I was paged at 7:58 AM this morning that patient was having new stroke symptoms.  I saw patient at the bedside within 5 minutes.  Patient was fully alert and oriented.  She was complaining of some weakness in the right upper extremity and some tingling in the right hand going on for about an hour.  Unsure about the intensity of the symptoms.  This patient is  very well-known to me during her recent hospitalization and she had similar kind of complaints during that hospitalization as well.  Stroke workup was negative.  Code stroke was called due to new symptoms reported by the patient.  Patient was seen by neurology, underwent CT code  stroke followed by MRI which were unremarkable as expected.  Some concern of possible seizure but more concerned that patient has tendency of attention seeking.  We will monitor her closely, if indicated, pursue EEG per neurology.     Chronic diastolic CHF (congestive heart failure) (HCC) No signs of decompensation.     Coronary artery disease with chronic angina On Ranexa, Xarelto, Lopressor, Plavix and Zetia.     HLD (hyperlipidemia) On ezetimibe 10 mg p.o. daily..     Chronic anemia Monitor hematocrit hemoglobin.     History of DVT (deep vein thrombosis) On Xarelto.     Thrombocytosis In the setting of anemia/chronic anticoagulation.     Obstructive sleep apnea CPAP at bedtime.  DVT prophylaxis: rivaroxaban (XARELTO) tablet 10 mg Start: 01/20/23 1000   Code Status: Full Code  Family Communication:  None present at bedside.  Plan of care discussed with patient in length and he/she verbalized understanding and agreed with it.  Status is: Observation The patient will require care spanning > 2 midnights and should be moved to inpatient because: Needs observation overnight due to new neurological symptoms.   Estimated body mass index is 35.65 kg/m as calculated from the following:   Height as of this encounter: 5\' 2"  (1.575 m).   Weight as of this encounter: 88.4 kg.    Nutritional Assessment: Body mass index is 35.65 kg/m.Marland Kitchen Seen by dietician.  I agree with the assessment and plan as outlined below: Nutrition Status:        . Skin Assessment: I have examined the patient's skin and I agree with the wound assessment as performed by the wound care RN as outlined below:    Consultants:  Neurology  Procedures:  None  Antimicrobials:  Anti-infectives (From admission, onward)    None         Subjective: Patient seen and examined, she was complaining of right upper extremity weakness and right hand tingling in the morning as mentioned above.  She was otherwise  fully alert and oriented.  Objective: Vitals:   01/20/23 0400 01/20/23 0500 01/20/23 0600 01/20/23 0700  BP: 122/85 (!) 125/90 (!) 148/89 (!) 141/84  Pulse: 97 (!) 123 95 (!) 101  Resp: 16 (!) 21 16 18   Temp:      TempSrc:      SpO2: 98% 99% 96% 99%  Weight:      Height:        Intake/Output Summary (Last 24 hours) at 01/20/2023 0842 Last data filed at 01/20/2023 0800 Gross per 24 hour  Intake 3908.51 ml  Output 1275 ml  Net 2633.51 ml   Filed Weights   01/19/23 1127  Weight: 88.4 kg    Examination:  General exam: Appears calm and comfortable  Respiratory system: Clear to auscultation. Respiratory effort normal. Cardiovascular system: S1 & S2 heard, RRR. No JVD, murmurs, rubs, gallops or clicks. No pedal edema. Gastrointestinal system: Abdomen is nondistended, soft and nontender. No organomegaly or masses felt. Normal bowel sounds heard. Central nervous system: Alert and oriented.  Left-sided spastic hemiparesis. Skin: No rashes, lesions or ulcers  Data Reviewed: I have personally reviewed following labs and imaging studies  CBC: Recent Labs  Lab 01/19/23 1230 01/20/23 0351  WBC 8.6 10.2  HGB 10.8* 10.1*  HCT 33.3* 30.5*  MCV 75.9* 74.2*  PLT 455* 440*   Basic Metabolic Panel: Recent Labs  Lab 01/19/23 1230 01/19/23 1827 01/19/23 2205 01/20/23 0351  NA 123* 130* 134* 133*  K 5.1 4.8 4.2 4.2  CL 85* 98 102 101  CO2 26 19* 25 25  GLUCOSE 503* 243* 178* 137*  BUN 47* 36* 30* 25*  CREATININE 2.15* 1.50* 1.16* 0.97  CALCIUM 9.3 9.1 9.2 9.0   GFR: Estimated Creatinine Clearance: 71.6 mL/min (by C-G formula based on SCr of 0.97 mg/dL). Liver Function Tests: Recent Labs  Lab 01/19/23 1230 01/20/23 0351  AST 11* 16  ALT 13 11  ALKPHOS 78 61  BILITOT 0.5 0.3  PROT 7.6 6.7  ALBUMIN 3.8 3.2*   No results for input(s): "LIPASE", "AMYLASE" in the last 168 hours. No results for input(s): "AMMONIA" in the last 168 hours. Coagulation Profile: No  results for input(s): "INR", "PROTIME" in the last 168 hours. Cardiac Enzymes: No results for input(s): "CKTOTAL", "CKMB", "CKMBINDEX", "TROPONINI" in the last 168 hours. BNP (last 3 results) No results for input(s): "PROBNP" in the last 8760 hours. HbA1C: No results for input(s): "HGBA1C" in the last 72 hours. CBG: Recent Labs  Lab 01/20/23 0255 01/20/23 0347 01/20/23 0504 01/20/23 0628 01/20/23 0732  GLUCAP 123* 144* 153* 137* 99   Lipid Profile: No results for input(s): "CHOL", "HDL", "LDLCALC", "TRIG", "CHOLHDL", "LDLDIRECT" in the last 72 hours. Thyroid Function Tests: No results for input(s): "TSH", "T4TOTAL", "FREET4", "T3FREE", "THYROIDAB" in the last 72 hours. Anemia Panel: No results for input(s): "VITAMINB12", "FOLATE", "FERRITIN", "TIBC", "IRON", "RETICCTPCT" in the last 72 hours. Sepsis Labs: No results for input(s): "PROCALCITON", "LATICACIDVEN" in the last 168 hours.  Recent Results (from the past 240 hours)  Resp panel by RT-PCR (RSV, Flu A&B, Covid) Anterior Nasal Swab     Status: None   Collection Time: 01/19/23  2:20 PM   Specimen: Anterior Nasal Swab  Result Value Ref Range Status   SARS Coronavirus 2 by RT PCR NEGATIVE NEGATIVE Final    Comment: (NOTE) SARS-CoV-2 target nucleic acids are NOT DETECTED.  The SARS-CoV-2 RNA is generally detectable in upper respiratory specimens during the acute phase of infection. The lowest concentration of SARS-CoV-2 viral copies this assay can detect is 138 copies/mL. A negative result does not preclude SARS-Cov-2 infection and should not be used as the sole basis for treatment or other patient management decisions. A negative result may occur with  improper specimen collection/handling, submission of specimen other than nasopharyngeal swab, presence of viral mutation(s) within the areas targeted by this assay, and inadequate number of viral copies(<138 copies/mL). A negative result must be combined with clinical  observations, patient history, and epidemiological information. The expected result is Negative.  Fact Sheet for Patients:  BloggerCourse.com  Fact Sheet for Healthcare Providers:  SeriousBroker.it  This test is no t yet approved or cleared by the [place] FDA and  has been authorized for detection and/or diagnosis of SARS-CoV-2 by FDA under an Emergency Use Authorization (EUA). This EUA will remain  in effect (meaning this test can be used) for the duration of the COVID-19 declaration under Section 564(b)(1) of the Act, 21 U.S.C.section 360bbb-3(b)(1), unless the authorization is terminated  or revoked sooner.       Influenza A by PCR NEGATIVE NEGATIVE Final   Influenza B by PCR NEGATIVE NEGATIVE Final    Comment: (NOTE) The Xpert Xpress SARS-CoV-2/FLU/RSV plus assay  is intended as an aid in the diagnosis of influenza from Nasopharyngeal swab specimens and should not be used as a sole basis for treatment. Nasal washings and aspirates are unacceptable for Xpert Xpress SARS-CoV-2/FLU/RSV testing.  Fact Sheet for Patients: BloggerCourse.com  Fact Sheet for Healthcare Providers: SeriousBroker.it  This test is not yet approved or cleared by the [place] FDA and has been authorized for detection and/or diagnosis of SARS-CoV-2 by FDA under an Emergency Use Authorization (EUA). This EUA will remain in effect (meaning this test can be used) for the duration of the COVID-19 declaration under Section 564(b)(1) of the Act, 21 U.S.C. section 360bbb-3(b)(1), unless the authorization is terminated or revoked.     Resp Syncytial Virus by PCR NEGATIVE NEGATIVE Final    Comment: (NOTE) Fact Sheet for Patients: BloggerCourse.com  Fact Sheet for Healthcare Providers: SeriousBroker.it  This test is not yet approved or cleared by  the [place] FDA and has been authorized for detection and/or diagnosis of SARS-CoV-2 by FDA under an Emergency Use Authorization (EUA). This EUA will remain in effect (meaning this test can be used) for the duration of the COVID-19 declaration under Section 564(b)(1) of the Act, 21 U.S.C. section 360bbb-3(b)(1), unless the authorization is terminated or revoked.  Performed at [facility], [address], [place], [place] [address]   MRSA Next Gen by PCR, Nasal     Status: None   Collection Time: 01/19/23  4:17 PM   Specimen: Nasal Mucosa; Nasal Swab  Result Value Ref Range Status   MRSA by PCR Next Gen NOT DETECTED NOT DETECTED Final    Comment: (NOTE) The GeneXpert MRSA Assay (FDA approved for NASAL specimens only), is one component of a comprehensive MRSA colonization surveillance program. It is not intended to diagnose MRSA infection nor to guide or monitor treatment for MRSA infections. Test performance is not FDA approved in patients less than 68 years old. Performed at [facility], [address] [place], [place] [address]      Radiology Studies: CT HEAD CODE STROKE WO CONTRAST Result Date: 01/20/2023 CLINICAL DATA:  Code stroke. 52 year old female right side numbness, abnormal speech. Last seen well 0720 hours. EXAM: CT HEAD WITHOUT CONTRAST TECHNIQUE: Contiguous axial images were obtained from the base of the skull through the vertex without intravenous contrast. RADIATION DOSE REDUCTION: This exam was performed according to the departmental dose-optimization program which includes automated exposure control, adjustment of the mA and/or kV according to patient size and/or use of iterative reconstruction technique. COMPARISON:  Brain MRI 11/03/2022.  Head CT yesterday. FINDINGS: Brain: Advanced chronic ischemic disease. Chronic encephalomalacia in the left cerebellum PICA territory, bilateral deep gray nuclei, bilateral corona  radiata with some involvement of the body of the corpus callosum, left MCA anterior division. Stable cerebral volume. No midline shift, mass effect, or evidence of intracranial mass lesion. No ventriculomegaly. No acute intracranial hemorrhage identified. No cortically based acute infarct identified. Vascular: Extensive Calcified atherosclerosis at the skull base. No suspicious intracranial vascular hyperdensity. Skull: Stable and intact. Sinuses/Orbits: Visualized paranasal sinuses and mastoids are clear. Other: No gaze deviation, acute orbit or scalp soft tissue finding. ASPECTS [facility] Stroke Program Early CT Score) Total score (0-10 with 10 being normal): 10, chronic encephalomalacia. IMPRESSION: Stable non contrast CT appearance of advanced chronic ischemic disease. No acute cortically based infarct or acute intracranial hemorrhage identified. ASPECTS 10. Electronically Signed   By: Odessa Fleming M.D.   On: 01/20/2023 08:35   CT Head  Wo Contrast Result Date: 01/19/2023 CLINICAL DATA:  53 year old female with history of altered mental status. EXAM: CT HEAD WITHOUT CONTRAST TECHNIQUE: Contiguous axial images were obtained from the base of the skull through the vertex without intravenous contrast. RADIATION DOSE REDUCTION: This exam was performed according to the departmental dose-optimization program which includes automated exposure control, adjustment of the mA and/or kV according to patient size and/or use of iterative reconstruction technique. COMPARISON:  Head CT 11/22/2022. FINDINGS: Brain: Mild cerebral and cerebellar atrophy. Patchy and confluent areas of decreased attenuation are noted throughout the deep and periventricular white matter of the cerebral hemispheres bilaterally, compatible with chronic microvascular ischemic disease. Multiple more well-defined areas of low attenuation in the basal ganglia bilaterally (right greater than left), similar to prior studies, compatible with old lacunar infarcts.  There is also well-defined low-attenuation in the left frontal lobe and the left cerebellar hemisphere, compatible with areas of encephalomalacia from remote infarcts, similar to the prior study. No evidence of acute infarction, hemorrhage, hydrocephalus, extra-axial collection or mass lesion/mass effect. Vascular: No hyperdense vessel or unexpected calcification. Skull: Normal. Negative for fracture or focal lesion. Sinuses/Orbits: No acute finding. Other: None. IMPRESSION: 1. No acute intracranial abnormalities. 2. Mild cerebral and cerebellar atrophy with extensive chronic microvascular ischemic changes in the cerebral white matter, old basal ganglia lacunar infarcts bilaterally, and old left frontal and left cerebellar hemisphere infarcts, similar to prior examination. Electronically Signed   By: Trudie Reed M.D.   On: 01/19/2023 12:13   DG Chest Portable 1 View Result Date: 01/19/2023 CLINICAL DATA:  52 year old female with shortness of breath, hypoglycemia, lethargy. EXAM: PORTABLE CHEST 1 VIEW COMPARISON:  Portable chest 12/25/2022 and earlier. FINDINGS: Portable AP semi upright view at 1138 hours. Low lung volumes, similar to prior. Chronic sternotomy. Mediastinal contours remain within normal limits. Visualized tracheal air column is within normal limits. Allowing for portable technique the lungs are clear. Evidence of right lung azygous vein and fissure (normal variant). Negative visible bowel gas. IMPRESSION: Low lung volumes, otherwise no acute cardiopulmonary abnormality. Electronically Signed   By: Odessa Fleming M.D.   On: 01/19/2023 12:09    Scheduled Meds:  amLODipine  10 mg Oral q AM   baclofen  10 mg Oral TID   Chlorhexidine Gluconate Cloth  6 each Topical Daily   clopidogrel  75 mg Oral Daily   escitalopram  20 mg Oral q morning   ezetimibe  10 mg Oral Daily   insulin aspart  0-15 Units Subcutaneous TID WC   insulin aspart  0-5 Units Subcutaneous QHS   methimazole  5 mg Oral q  morning   metoCLOPramide  5 mg Oral TID AC   metoprolol tartrate  100 mg Oral BID   pantoprazole  40 mg Oral Daily   ranolazine  500 mg Oral BID   rivaroxaban  10 mg Oral Daily   sodium chloride flush  10 mL Intravenous Q12H   sodium chloride flush  10 mL Intravenous Q12H   sodium chloride flush  3 mL Intravenous Q12H   tamsulosin  0.4 mg Oral QHS   topiramate  25 mg Oral BID   Continuous Infusions:  sodium chloride       LOS: 0 days   Hughie Closs, MD [facility]  01/20/2023, 8:42 AM   *Please note that this is a verbal dictation therefore any spelling or grammatical errors are due to the "Dragon Medical One" system interpretation.  Please page via Amion and do not message via  secure chat for urgent patient care matters. Secure chat can be used for non urgent patient care matters.  How to contact the [facility] Attending or Consulting provider 7A - 7P or covering provider during after hours 7P -7A, for this patient?  Check the care team in [facility] and look for a) attending/consulting TRH provider listed and b) the [facility] team listed. Page or secure chat 7A-7P. Log into [email or web address] and use [place]'s universal password to access. If you do not have the password, please contact the hospital operator. Locate the [facility] provider you are looking for under [facility] and page to a number that you can be directly reached. If you still have difficulty reaching the provider, please page the [facility] (Director on Call) for the Hospitalists listed on amion for assistance.

## 2023-01-21 DIAGNOSIS — E1100 Type 2 diabetes mellitus with hyperosmolarity without nonketotic hyperglycemic-hyperosmolar coma (NKHHC): Secondary | ICD-10-CM | POA: Diagnosis not present

## 2023-01-21 LAB — BASIC METABOLIC PANEL
Anion gap: 9 (ref 5–15)
BUN: 19 mg/dL (ref 6–20)
CO2: 22 mmol/L (ref 22–32)
Calcium: 9 mg/dL (ref 8.9–10.3)
Chloride: 103 mmol/L (ref 98–111)
Creatinine, Ser: 0.97 mg/dL (ref 0.44–1.00)
GFR, Estimated: >60 mL/min (ref >60)
Glucose, Bld: 113 mg/dL — ABNORMAL HIGH (ref 70–99)
Potassium: 4.6 mmol/L (ref 3.5–5.1)
Sodium: 134 mmol/L — ABNORMAL LOW (ref 135–145)

## 2023-01-21 LAB — GLUCOSE, CAPILLARY
Glucose-Capillary: 127 mg/dL — ABNORMAL HIGH (ref 70–99)
Glucose-Capillary: 198 mg/dL — ABNORMAL HIGH (ref 70–99)

## 2023-01-21 MED ORDER — DIPHENHYDRAMINE HCL 25 MG PO CAPS
25.0000 mg | ORAL_CAPSULE | Freq: Once | ORAL | Status: AC | PRN
  Administered 2023-01-21: 25 mg via ORAL
  Filled 2023-01-21: qty 1

## 2023-01-21 MED ORDER — DICLOFENAC SODIUM 1 % EX GEL
2.0000 g | Freq: Four times a day (QID) | CUTANEOUS | Status: DC
  Administered 2023-01-21 (×2): 2 g via TOPICAL
  Filled 2023-01-21: qty 100

## 2023-01-21 NOTE — TOC Progression Note (Signed)
Transition of Care [facility]) - Progression Note    Patient Details  Name: Lori [name] MRN: [id number] Date of Birth: March 13, 1970  [facility]) CM/SW Contact  Geni Bers, RN Phone Number: 01/21/2023, 11:20 AM  Clinical Narrative:    Sharin Mons was called to transport pt home. RN is aware.         Expected Discharge Plan and Services         Expected Discharge Date: 01/21/23                                     Social Determinants of Health (SDOH) Interventions SDOH Screenings   Food Insecurity: No Food Insecurity (01/19/2023)  Housing: Low Risk  (01/19/2023)  Transportation Needs: No Transportation Needs (01/19/2023)  Utilities: Not At Risk (01/19/2023)  Depression (PHQ2-9): Low Risk  (12/24/2022)  Recent Concern: Depression (PHQ2-9) - Medium Risk (10/28/2022)  Tobacco Use: Low Risk  (01/19/2023)    Readmission Risk Interventions    12/28/2022   11:30 AM 11/05/2022    1:38 PM 10/15/2022   10:25 AM  Readmission Risk Prevention Plan  Transportation Screening Complete Complete Complete  Medication Review [employer]) Complete Complete   PCP or Specialist appointment within 3-5 days of discharge Complete Complete Complete  HRI or Home Care Consult Complete Complete Complete  SW Recovery Care/Counseling Consult Complete Complete Complete  Palliative Care Screening Not Applicable Not Applicable Not Applicable  Skilled Nursing Facility Not Applicable Not Applicable Not Applicable

## 2023-01-21 NOTE — Plan of Care (Signed)
Problem: Education: Goal: Knowledge of General Education information will improve Description: Including pain rating scale, medication(s)/side effects and non-pharmacologic comfort measures Outcome: Adequate for Discharge   Problem: Health Behavior/Discharge Planning: Goal: Ability to manage health-related needs will improve Outcome: Adequate for Discharge   Problem: Clinical Measurements: Goal: Ability to maintain clinical measurements within normal limits will improve Outcome: Adequate for Discharge Goal: Will remain free from infection Outcome: Adequate for Discharge Goal: Diagnostic test results will improve Outcome: Adequate for Discharge Goal: Respiratory complications will improve Outcome: Adequate for Discharge Goal: Cardiovascular complication will be avoided Outcome: Adequate for Discharge   Problem: Activity: Goal: Risk for activity intolerance will decrease Outcome: Adequate for Discharge   Problem: Nutrition: Goal: Adequate nutrition will be maintained Outcome: Adequate for Discharge   Problem: Coping: Goal: Level of anxiety will decrease Outcome: Adequate for Discharge   Problem: Elimination: Goal: Will not experience complications related to bowel motility Outcome: Adequate for Discharge Goal: Will not experience complications related to urinary retention Outcome: Adequate for Discharge   Problem: Pain Management: Goal: General experience of comfort will improve Outcome: Adequate for Discharge   Problem: Safety: Goal: Ability to remain free from injury will improve Outcome: Adequate for Discharge   Problem: Skin Integrity: Goal: Risk for impaired skin integrity will decrease Outcome: Adequate for Discharge   Problem: Education: Goal: Ability to describe self-care measures that may prevent or decrease complications (Diabetes Survival Skills Education) will improve Outcome: Adequate for Discharge Goal: Individualized Educational Video(s) Outcome:  Adequate for Discharge   Problem: Coping: Goal: Ability to adjust to condition or change in health will improve Outcome: Adequate for Discharge   Problem: Fluid Volume: Goal: Ability to maintain a balanced intake and output will improve Outcome: Adequate for Discharge   Problem: Health Behavior/Discharge Planning: Goal: Ability to identify and utilize available resources and services will improve Outcome: Adequate for Discharge Goal: Ability to manage health-related needs will improve Outcome: Adequate for Discharge   Problem: Metabolic: Goal: Ability to maintain appropriate glucose levels will improve Outcome: Adequate for Discharge   Problem: Nutritional: Goal: Maintenance of adequate nutrition will improve Outcome: Adequate for Discharge Goal: Progress toward achieving an optimal weight will improve Outcome: Adequate for Discharge   Problem: Skin Integrity: Goal: Risk for impaired skin integrity will decrease Outcome: Adequate for Discharge   Problem: Tissue Perfusion: Goal: Adequacy of tissue perfusion will improve Outcome: Adequate for Discharge   Problem: Education: Goal: Ability to describe self-care measures that may prevent or decrease complications (Diabetes Survival Skills Education) will improve Outcome: Adequate for Discharge Goal: Individualized Educational Video(s) Outcome: Adequate for Discharge   Problem: Cardiac: Goal: Ability to maintain an adequate cardiac output will improve Outcome: Adequate for Discharge   Problem: Health Behavior/Discharge Planning: Goal: Ability to identify and utilize available resources and services will improve Outcome: Adequate for Discharge Goal: Ability to manage health-related needs will improve Outcome: Adequate for Discharge   Problem: Fluid Volume: Goal: Ability to achieve a balanced intake and output will improve Outcome: Adequate for Discharge   Problem: Metabolic: Goal: Ability to maintain appropriate  glucose levels will improve Outcome: Adequate for Discharge   Problem: Nutritional: Goal: Maintenance of adequate nutrition will improve Outcome: Adequate for Discharge Goal: Maintenance of adequate weight for body size and type will improve Outcome: Adequate for Discharge   Problem: Respiratory: Goal: Will regain and/or maintain adequate ventilation Outcome: Adequate for Discharge   Problem: Urinary Elimination: Goal: Ability to achieve and maintain adequate renal perfusion and functioning will improve  Outcome: Adequate for Discharge

## 2023-01-21 NOTE — Discharge Summary (Signed)
Physician Discharge Summary  Lori [name] JYN:[id number] DOB: [birth year] DOA: 01/19/2023  PCP: Lori Bookman, NP  Admit date: 01/19/2023 Discharge date: 01/21/2023 30 Day Unplanned Readmission Risk Score    Flowsheet Row ED to Hosp-Admission (Current) from 01/19/2023 in [facility]-ICU/STEPDOWN  30 Day Unplanned Readmission Risk Score (%) 50.08 Filed at 01/21/2023 0801       This score is the patient's risk of an unplanned readmission within 30 days of being discharged (0 -100%). The score is based on dignosis, age, lab data, medications, orders, and past utilization.   Low:  0-14.9   Medium: 15-21.9   High: 22-29.9   Extreme: 30 and above          Admitted From: Home Disposition: Home  Recommendations for Outpatient Follow-up:  Follow up with PCP in 1-2 weeks Please obtain BMP/CBC in one week Please follow up with your PCP on the following pending results: Unresulted Labs (From admission, onward)     Start     Ordered   01/20/23 0500  Basic metabolic panel  Daily,   R     Question:  Specimen collection method  Answer:  Lab=Lab collect   01/20/23 0202              Home Health: None Equipment/Devices: None  Discharge Condition: Stable CODE STATUS: Full code Diet recommendation: Diabetic and cardiac  Following HPI, lab work, imaging and ED course is copied from admitting hospitalist H&P. HPI: Lori [name] is a 52 y.o. female with medical history significant of CAD/CABG, depression, anxiety, type 2 diabetes, hyperlipidemia, diabetic gastroparesis, history of DVT, hypertension, pheochromocytoma, hypothyroidism, post ablation hypothyroidism, class II obesity, history of CVA with left-sided spastic hemiparesis who presented to the emergency department with complaints of persistent nausea and emesis. She was recently admitted from 12/25/2022 until 12/28/2022 with diabetic gastroparesis.  She has also been  mated with similar symptoms like today in the setting of hyperglycemic state with varying degrees of encephalopathy requiring insulin infusion.  She is unable to provide further information at this time..    Lab work: Urine analysis show glucosuria with rare bacteria.  Negative coronavirus, influenza and RSV PCR.  CBC 8.6, hemoglobin 10.8 g/dL platelets 865.  Venous blood gas shows PaO2 less than 31 mmHg, HCO3 33.1 and acid-base acid 6.3 mmol/L.  pH, pCO2 and O2 saturation were normal.  CMP showed glucose of 503, BUN of 47 and creatinine of 2.15 mg/deciliter.  Corrected sodium 133 mmol/L.  Normal potassium and CO2 with a normal anion gap.  Calcium is normal.  LFTs were unremarkable.  Beta hydroxybutyric acid 0.28.   Imaging: Low lung volumes, otherwise no acute pulmonary abnormality.  CT head without contrast with no acute intracranial normality.  There is mild cerebral and cerebellar atrophy with extensive chronic microvascular ischemic changes in the white matter.  Old infarcts.   ED course: Initial vital signs were temperature 97.7 F, pulse 110, respirations 16, BP 103/75 mmHg O2 sat 100% on room air.  The patient received insulin 8 units SQ x 1, LR 1768 mL bolus, ondansetron 4 mg IVP and 1000 mL normal saline bolus.  Subjective: Seen and examined, eating breakfast, no nausea or vomiting, no new symptoms.  Feels back to baseline and she is happy about going home today.  Brief/Interim Summary: Patient was admitted under hospital service, please refer to following for details.  Acute metabolic encephalopathy: Likely in the setting of diabetic HHS, fully alert and oriented currently.  Diabetic hyperosmolar non-ketotic state (HCC) Resolved after starting on insulin infusion.  She has been weaned off of insulin and currently only on SSI.  She takes 22 units of Lantus at home, will resume home regimen.     AKI (acute kidney injury) (HCC)/dehydration Secondary to HHS.  Resolved.  Renal function  back to normal now.     Left hemiplegia (HCC) History of CVA with spastic paresis/complains of new right-sided weakness and tingling I was paged at 7:58 AM on morning of 01/20/2023 that patient was having new stroke symptoms.  I saw patient at the bedside within 5 minutes.  Patient was fully alert and oriented.  She was complaining of some weakness in the right upper extremity and some tingling in the right hand going on for about an hour.  Unsure about the intensity of the symptoms.  This patient is very well-known to me during her recent hospitalization and she had similar kind of complaints during that hospitalization as well.  Stroke workup was negative at that time.  Code stroke was called this time due to new symptoms reported by the patient.  Patient was seen by neurology, underwent CT code stroke followed by MRI which were unremarkable as expected.  No return of symptoms.  Patient back to baseline with left hemiparesis.     Chronic diastolic CHF (congestive heart failure) (HCC) No signs of decompensation.     Coronary artery disease with chronic angina On Ranexa, Xarelto, Lopressor, Plavix and Zetia.     HLD (hyperlipidemia) On ezetimibe 10 mg p.o. daily..     Chronic anemia Monitor hematocrit hemoglobin.     History of DVT (deep vein thrombosis) On Xarelto.     Thrombocytosis In the setting of anemia/chronic anticoagulation.     Obstructive sleep apnea CPAP at bedtime.  Discharge plan was discussed with patient and/or family member and they verbalized understanding and agreed with it.  Discharge Diagnoses:  Principal Problem:   Diabetic hyperosmolar non-ketotic state (HCC) Active Problems:   AKI (acute kidney injury) (HCC)   History of CVA with spastic paresis (cerebrovascular accident)   Essential hypertension   Left hemiplegia (HCC)   Coronary artery disease with chronic angina   Chronic diastolic CHF (congestive heart failure) (HCC)   Diabetic gastroparesis (HCC)    CAD (coronary artery disease)   HLD (hyperlipidemia)   GERD (gastroesophageal reflux disease)   Chronic anemia   Acute metabolic encephalopathy   History of DVT (deep vein thrombosis)   Thrombocytosis   Obstructive sleep apnea   HHS (hypothenar hammer syndrome) (HCC)    Discharge Instructions   Allergies as of 01/21/2023       Reactions   Asa [aspirin] Anaphylaxis, Swelling, Other (See Comments)   Throat closes   Contrast Media [iodinated Contrast Media] Anaphylaxis, Swelling   Imitrex [sumatriptan] Anaphylaxis   Ms Contin [morphine] Anaphylaxis, Swelling   Penicillins Swelling, Other (See Comments)   Mouth and eyes swell   Firvanq [vancomycin] Other (See Comments)   Red Man's Syndrome   Cipro [ciprofloxacin Hcl] Nausea And Vomiting   Nitrofuran Derivatives Anxiety   Wound Dressing Adhesive Itching, Rash        Medication List     STOP taking these medications    atorvastatin 80 MG tablet Commonly known as: LIPITOR       TAKE these medications    albuterol 108 (90 Base) MCG/ACT inhaler Commonly known as: VENTOLIN HFA INHALE 2 PUFFS BY MOUTH EVERY 6 HOURS AS NEEDED FOR WHEEZING AND  FOR SHORTNESS OF BREATH Strength: 108 (90 Base) MCG/ACT What changed:  how much to take how to take this when to take this reasons to take this additional instructions   albuterol (2.5 MG/3ML) 0.083% nebulizer solution Commonly known as: PROVENTIL Take 3 mLs (2.5 mg total) by nebulization every 6 (six) hours as needed for wheezing or shortness of breath. What changed: Another medication with the same name was changed. Make sure you understand how and when to take each.   amLODipine 5 MG tablet Commonly known as: NORVASC Take 10 mg by mouth in the morning.   baclofen 10 MG tablet Commonly known as: LIORESAL Take 1 tablet (10 mg total) by mouth 3 (three) times daily.   bisacodyl 5 MG EC tablet Commonly known as: DULCOLAX Take 1 tablet (5 mg total) by mouth daily as  needed for moderate constipation.   clopidogrel 75 MG tablet Commonly known as: PLAVIX Take 1 tablet (75 mg total) by mouth daily.   cyclobenzaprine 5 MG tablet Commonly known as: FLEXERIL TAKE 1 TABLET BY MOUTH 3 TIMES DAILY AS NEEDED FOR MUSCLE SPASMS   escitalopram 20 MG tablet Commonly known as: LEXAPRO Take 1 tablet (20 mg total) by mouth every morning.   ezetimibe 10 MG tablet Commonly known as: Zetia Take 1 tablet (10 mg total) by mouth daily.   FreeStyle Libre 14 Day Sensor Misc 1 each by Does not apply route as needed. DX: E11.69   FreeStyle Libre 2 Reader Devi 1 Device by Does not apply route daily. E11.69   gabapentin 100 MG capsule Commonly known as: NEURONTIN Take 100 mg by mouth 3 (three) times daily.   insulin lispro 100 UNIT/ML injection Commonly known as: HUMALOG Sliding scale AC, HS CBG 121 - 150: 2 units  CBG 151 - 200: 3 units  CBG 201 - 250: 5 units  CBG 251 - 300: 8 units  CBG 301 - 350: 11 units  CBG 351 - 400: 15 units  CBG > 400: call MD   insulin lispro 100 UNIT/ML injection Commonly known as: HUMALOG Inject 0.04 mLs (4 Units total) into the skin 3 (three) times daily with meals.   Lantus SoloStar 100 UNIT/ML Solostar Pen Generic drug: insulin glargine Inject 20 Units into the skin at bedtime. What changed: how much to take   LORazepam 0.5 MG tablet Commonly known as: ATIVAN Take 1 tablet (0.5 mg total) by mouth at bedtime.   methimazole 5 MG tablet Commonly known as: TAPAZOLE Take 1 tablet (5 mg total) by mouth every morning.   metoCLOPramide 5 MG tablet Commonly known as: REGLAN Take 1 tablet (5 mg total) by mouth 3 (three) times daily before meals.   metoprolol tartrate 100 MG tablet Commonly known as: LOPRESSOR Take 1 tablet (100 mg total) by mouth 2 (two) times daily.   olmesartan-hydrochlorothiazide 40-25 MG tablet Commonly known as: BENICAR HCT Take 1 tablet by mouth in the morning.   omeprazole 20 MG  capsule Commonly known as: PRILOSEC TAKE 1 CAPSULE(20 MG) BY MOUTH DAILY What changed: See the new instructions.   polyethylene glycol powder 17 GM/SCOOP powder Commonly known as: GLYCOLAX/MIRALAX Take 17 g by mouth daily. Hold for loose stool   ranolazine 500 MG 12 hr tablet Commonly known as: RANEXA Take 1 tablet (500 mg total) by mouth 2 (two) times daily.   rivaroxaban 10 MG Tabs tablet Commonly known as: XARELTO Take 1 tablet (10 mg total) by mouth daily.   tamsulosin 0.4 MG Caps capsule  Commonly known as: FLOMAX Take 0.4 mg by mouth at bedtime.   topiramate 25 MG tablet Commonly known as: TOPAMAX Take 1 tablet (25 mg total) by mouth 2 (two) times daily.   Voltaren 1 % Gel Generic drug: diclofenac Sodium Apply 2 g topically 4 (four) times daily.   zinc oxide 11.3 % Crea cream Commonly known as: BALMEX Apply 1 Application topically 2 (two) times daily.        Follow-up Information     Ngetich, Dinah C, NP Follow up in 1 week(s).   Specialty: Family Medicine Contact information: [address] [place] [address] [phone]                Allergies  Allergen Reactions   Asa [Aspirin] Anaphylaxis, Swelling and Other (See Comments)    Throat closes   Contrast Media [Iodinated Contrast Media] Anaphylaxis and Swelling   Imitrex [Sumatriptan] Anaphylaxis   Ms Contin [Morphine] Anaphylaxis and Swelling   Penicillins Swelling and Other (See Comments)    Mouth and eyes swell   Firvanq [Vancomycin] Other (See Comments)    Red Man's Syndrome   Cipro [Ciprofloxacin Hcl] Nausea And Vomiting   Nitrofuran Derivatives Anxiety   Wound Dressing Adhesive Itching and Rash    Consultations: Neurology   Procedures/Studies: MR BRAIN WO CONTRAST Result Date: 01/20/2023 CLINICAL DATA:  Neuro deficit, acute, stroke suspected. Right-sided numbness and slurred speech. EXAM: MRI HEAD WITHOUT CONTRAST TECHNIQUE: Multiplanar, multiecho pulse sequences of the brain  and surrounding structures were obtained without intravenous contrast. COMPARISON:  Head CT 01/20/2023 and MRI 11/04/2022 FINDINGS: A complete MRI could not be obtained due to the patient's condition. Axial and coronal diffusion, sagittal T1, axial and coronal T2, and axial FLAIR sequences were obtained and are motion degraded, including severe motion on the coronal T2 sequence. Brain: Diffusion-weighted imaging is of good quality, and there is no evidence of an acute infarct. Advanced chronic small-vessel ischemic disease is again noted in the cerebral white matter with chronic infarcts in the deep white matter and deep gray nuclei bilaterally. A small chronic left frontal cortical infarct and moderate-sized chronic left cerebellar infarcts are also again noted. There is wallerian degeneration involving the brainstem on the right. There is mild cerebral atrophy. Lack of susceptibility weighted or gradient echo imaging limits assessment for intracranial hemorrhage. There is no midline shift or extra-axial fluid collection. Vascular: Absent flow void in the distal left vertebral artery, unchanged from the prior MRI and which may reflect chronic occlusion or slow flow. Skull and upper cervical spine: Unremarkable bone marrow signal. Sinuses/Orbits: Grossly unchanged 1 cm FLAIR hyperintense nodule at the anterosuperior aspect of the left orbit as described on the prior MRI. Clear sinuses. Other: None. IMPRESSION: 1. Motion degraded, incomplete examination. 2. No acute infarct. 3. Advanced chronic ischemia with multiple old infarcts. Electronically Signed   By: Sebastian Ache M.D.   On: 01/20/2023 09:33   CT HEAD CODE STROKE WO CONTRAST Addendum Date: 01/20/2023 ADDENDUM REPORT: 01/20/2023 08:47 ADDENDUM: Study briefly discussed by telephone with Dr. Hughie Closs on 01/20/2023 at 0840 hours. And these results were communicated to Dr. Iver Nestle at 8:47 am on 01/20/2023 by text page via the [facility] messaging system.  Electronically Signed   By: Odessa Fleming M.D.   On: 01/20/2023 08:47   Result Date: 01/20/2023 CLINICAL DATA:  Code stroke. 52 year old female right side numbness, abnormal speech. Last seen well 0720 hours. EXAM: CT HEAD WITHOUT CONTRAST TECHNIQUE: Contiguous axial images were obtained from  the base of the skull through the vertex without intravenous contrast. RADIATION DOSE REDUCTION: This exam was performed according to the departmental dose-optimization program which includes automated exposure control, adjustment of the mA and/or kV according to patient size and/or use of iterative reconstruction technique. COMPARISON:  Brain MRI 11/03/2022.  Head CT yesterday. FINDINGS: Brain: Advanced chronic ischemic disease. Chronic encephalomalacia in the left cerebellum PICA territory, bilateral deep gray nuclei, bilateral corona radiata with some involvement of the body of the corpus callosum, left MCA anterior division. Stable cerebral volume. No midline shift, mass effect, or evidence of intracranial mass lesion. No ventriculomegaly. No acute intracranial hemorrhage identified. No cortically based acute infarct identified. Vascular: Extensive Calcified atherosclerosis at the skull base. No suspicious intracranial vascular hyperdensity. Skull: Stable and intact. Sinuses/Orbits: Visualized paranasal sinuses and mastoids are clear. Other: No gaze deviation, acute orbit or scalp soft tissue finding. ASPECTS [facility] Stroke Program Early CT Score) Total score (0-10 with 10 being normal): 10, chronic encephalomalacia. IMPRESSION: Stable non contrast CT appearance of advanced chronic ischemic disease. No acute cortically based infarct or acute intracranial hemorrhage identified. ASPECTS 10. Electronically Signed: By: Odessa Fleming M.D. On: 01/20/2023 08:35   CT Head Wo Contrast Result Date: 01/19/2023 CLINICAL DATA:  52 year old female with history of altered mental status. EXAM: CT HEAD WITHOUT CONTRAST TECHNIQUE: Contiguous  axial images were obtained from the base of the skull through the vertex without intravenous contrast. RADIATION DOSE REDUCTION: This exam was performed according to the departmental dose-optimization program which includes automated exposure control, adjustment of the mA and/or kV according to patient size and/or use of iterative reconstruction technique. COMPARISON:  Head CT 11/22/2022. FINDINGS: Brain: Mild cerebral and cerebellar atrophy. Patchy and confluent areas of decreased attenuation are noted throughout the deep and periventricular white matter of the cerebral hemispheres bilaterally, compatible with chronic microvascular ischemic disease. Multiple more well-defined areas of low attenuation in the basal ganglia bilaterally (right greater than left), similar to prior studies, compatible with old lacunar infarcts. There is also well-defined low-attenuation in the left frontal lobe and the left cerebellar hemisphere, compatible with areas of encephalomalacia from remote infarcts, similar to the prior study. No evidence of acute infarction, hemorrhage, hydrocephalus, extra-axial collection or mass lesion/mass effect. Vascular: No hyperdense vessel or unexpected calcification. Skull: Normal. Negative for fracture or focal lesion. Sinuses/Orbits: No acute finding. Other: None. IMPRESSION: 1. No acute intracranial abnormalities. 2. Mild cerebral and cerebellar atrophy with extensive chronic microvascular ischemic changes in the cerebral white matter, old basal ganglia lacunar infarcts bilaterally, and old left frontal and left cerebellar hemisphere infarcts, similar to prior examination. Electronically Signed   By: Trudie Reed M.D.   On: 01/19/2023 12:13   DG Chest Portable 1 View Result Date: 01/19/2023 CLINICAL DATA:  52 year old female with shortness of breath, hypoglycemia, lethargy. EXAM: PORTABLE CHEST 1 VIEW COMPARISON:  Portable chest 12/25/2022 and earlier. FINDINGS: Portable AP semi upright  view at 1138 hours. Low lung volumes, similar to prior. Chronic sternotomy. Mediastinal contours remain within normal limits. Visualized tracheal air column is within normal limits. Allowing for portable technique the lungs are clear. Evidence of right lung azygous vein and fissure (normal variant). Negative visible bowel gas. IMPRESSION: Low lung volumes, otherwise no acute cardiopulmonary abnormality. Electronically Signed   By: Odessa Fleming M.D.   On: 01/19/2023 12:09   DG Abd 2 Views Result Date: 01/03/2023 CLINICAL DATA:  Constipation EXAM: ABDOMEN - 2 VIEW COMPARISON:  11/22/2022 FINDINGS: Two supine frontal views of the  abdomen and pelvis are obtained. No bowel obstruction or ileus. Minimal stool throughout the colon. No masses or abnormal calcifications. Lung bases are clear. IMPRESSION: 1. No bowel obstruction or ileus. Minimal retained stool within the colon. Electronically Signed   By: Sharlet Salina M.D.   On: 01/03/2023 12:35   NM GASTRIC EMPTYING Result Date: 12/27/2022 CLINICAL DATA:  Gastroparesis EXAM: NUCLEAR MEDICINE GASTRIC EMPTYING SCAN TECHNIQUE: After oral ingestion of radiolabeled meal, sequential abdominal images were obtained for 240 minutes. Residual percentage of activity remaining within the stomach was calculated at 60, 120 and 240 minutes. RADIOPHARMACEUTICALS:  1.92 mCi Tc-39m cephalocaudal Lloyd in egg beaters water COMPARISON:  None Available. FINDINGS: Expected location of the stomach in the left upper quadrant. Ingested meal empties the stomach gradually over the course of the study with 67% retention at 60 min and 52% retention at 120 min and 24% retention at 240 minutes (normal retention less than 30% at a 120 min). IMPRESSION: Delayed gastric emptying. Electronically Signed   By: Karen Kays M.D.   On: 12/27/2022 14:08   [place] EKG SITE RITE Result Date: 12/26/2022 If Site Rite image not attached, placement could not be confirmed due to current cardiac rhythm.  DG Chest  Port 1 View Result Date: 12/25/2022 CLINICAL DATA:  Chest pain, vomiting, lethargic EXAM: PORTABLE CHEST 1 VIEW COMPARISON:  11/21/2022 FINDINGS: Single frontal view of the chest demonstrates postsurgical changes from median sternotomy. The cardiac silhouette is stable. No acute airspace disease, effusion, or pneumothorax. No acute bony abnormalities. IMPRESSION: 1. No acute intrathoracic process. Electronically Signed   By: Sharlet Salina M.D.   On: 12/25/2022 21:34     Discharge Exam: Vitals:   01/21/23 0600 01/21/23 0807  BP: (!) 99/59 125/81  Pulse: 74   Resp: 13   Temp:  97.6 F (36.4 C)  SpO2: 97%    Vitals:   01/21/23 0400 01/21/23 0500 01/21/23 0600 01/21/23 0807  BP: (!) 95/57 119/64 (!) 99/59 125/81  Pulse: 77 81 74   Resp: 18 18 13    Temp:    97.6 F (36.4 C)  TempSrc:    Oral  SpO2: 97% 96% 97%   Weight:      Height:        General: Pt is alert, awake, not in acute distress Cardiovascular: RRR, S1/S2 +, no rubs, no gallops Respiratory: CTA bilaterally, no wheezing, no rhonchi Abdominal: Soft, NT, ND, bowel sounds + Extremities: no edema, no cyanosis, left hemiparesis    The results of significant diagnostics from this hospitalization (including imaging, microbiology, ancillary and laboratory) are listed below for reference.     Microbiology: Recent Results (from the past 240 hours)  Resp panel by RT-PCR (RSV, Flu A&B, Covid) Anterior Nasal Swab     Status: None   Collection Time: 01/19/23  2:20 PM   Specimen: Anterior Nasal Swab  Result Value Ref Range Status   SARS Coronavirus 2 by RT PCR NEGATIVE NEGATIVE Final    Comment: (NOTE) SARS-CoV-2 target nucleic acids are NOT DETECTED.  The SARS-CoV-2 RNA is generally detectable in upper respiratory specimens during the acute phase of infection. The lowest concentration of SARS-CoV-2 viral copies this assay can detect is 138 copies/mL. A negative result does not preclude SARS-Cov-2 infection and should not  be used as the sole basis for treatment or other patient management decisions. A negative result may occur with  improper specimen collection/handling, submission of specimen other than nasopharyngeal swab, presence of viral mutation(s) within the  areas targeted by this assay, and inadequate number of viral copies(<138 copies/mL). A negative result must be combined with clinical observations, patient history, and epidemiological information. The expected result is Negative.  Fact Sheet for Patients:  BloggerCourse.com  Fact Sheet for Healthcare Providers:  SeriousBroker.it  This test is no t yet approved or cleared by the [place] FDA and  has been authorized for detection and/or diagnosis of SARS-CoV-2 by FDA under an Emergency Use Authorization (EUA). This EUA will remain  in effect (meaning this test can be used) for the duration of the COVID-19 declaration under Section 564(b)(1) of the Act, 21 U.S.C.section 360bbb-3(b)(1), unless the authorization is terminated  or revoked sooner.       Influenza A by PCR NEGATIVE NEGATIVE Final   Influenza B by PCR NEGATIVE NEGATIVE Final    Comment: (NOTE) The Xpert Xpress SARS-CoV-2/FLU/RSV plus assay is intended as an aid in the diagnosis of influenza from Nasopharyngeal swab specimens and should not be used as a sole basis for treatment. Nasal washings and aspirates are unacceptable for Xpert Xpress SARS-CoV-2/FLU/RSV testing.  Fact Sheet for Patients: BloggerCourse.com  Fact Sheet for Healthcare Providers: SeriousBroker.it  This test is not yet approved or cleared by the [place] FDA and has been authorized for detection and/or diagnosis of SARS-CoV-2 by FDA under an Emergency Use Authorization (EUA). This EUA will remain in effect (meaning this test can be used) for the duration of the COVID-19 declaration under Section  564(b)(1) of the Act, 21 U.S.C. section 360bbb-3(b)(1), unless the authorization is terminated or revoked.     Resp Syncytial Virus by PCR NEGATIVE NEGATIVE Final    Comment: (NOTE) Fact Sheet for Patients: BloggerCourse.com  Fact Sheet for Healthcare Providers: SeriousBroker.it  This test is not yet approved or cleared by the [place] FDA and has been authorized for detection and/or diagnosis of SARS-CoV-2 by FDA under an Emergency Use Authorization (EUA). This EUA will remain in effect (meaning this test can be used) for the duration of the COVID-19 declaration under Section 564(b)(1) of the Act, 21 U.S.C. section 360bbb-3(b)(1), unless the authorization is terminated or revoked.  Performed at [facility], [address] [place], [place] [address]   MRSA Next Gen by PCR, Nasal     Status: None   Collection Time: 01/19/23  4:17 PM   Specimen: Nasal Mucosa; Nasal Swab  Result Value Ref Range Status   MRSA by PCR Next Gen NOT DETECTED NOT DETECTED Final    Comment: (NOTE) The GeneXpert MRSA Assay (FDA approved for NASAL specimens only), is one component of a comprehensive MRSA colonization surveillance program. It is not intended to diagnose MRSA infection nor to guide or monitor treatment for MRSA infections. Test performance is not FDA approved in patients less than 24 years old. Performed at [facility], [address], [place], [place] [address]      Labs: BNP (last 3 results) Recent Labs    04/30/22 1245 05/22/22 0229 09/06/22 2242  BNP 26.3 49.6 19.5   Basic Metabolic Panel: Recent Labs  Lab 01/19/23 1230 01/19/23 1827 01/19/23 2205 01/20/23 0351 01/21/23 0534  NA 123* 130* 134* 133* 134*  K 5.1 4.8 4.2 4.2 4.6  CL 85* 98 102 101 103  CO2 26 19* 25 25 22   GLUCOSE 503* 243* 178* 137* 113*  BUN 47* 36* 30* 25* 19  CREATININE 2.15* 1.50* 1.16* 0.97 0.97   CALCIUM 9.3 9.1 9.2 9.0 9.0   Liver  Function Tests: Recent Labs  Lab 01/19/23 1230 01/20/23 0351  AST 11* 16  ALT 13 11  ALKPHOS 78 61  BILITOT 0.5 0.3  PROT 7.6 6.7  ALBUMIN 3.8 3.2*   No results for input(s): "LIPASE", "AMYLASE" in the last 168 hours. No results for input(s): "AMMONIA" in the last 168 hours. CBC: Recent Labs  Lab 01/19/23 1230 01/20/23 0351  WBC 8.6 10.2  HGB 10.8* 10.1*  HCT 33.3* 30.5*  MCV 75.9* 74.2*  PLT 455* 440*   Cardiac Enzymes: No results for input(s): "CKTOTAL", "CKMB", "CKMBINDEX", "TROPONINI" in the last 168 hours. BNP: Invalid input(s): "POCBNP" CBG: Recent Labs  Lab 01/20/23 0732 01/20/23 1119 01/20/23 1621 01/20/23 2122 01/21/23 0722  GLUCAP 99 172* 293* 224* 127*   D-Dimer No results for input(s): "DDIMER" in the last 72 hours. Hgb A1c No results for input(s): "HGBA1C" in the last 72 hours. Lipid Profile No results for input(s): "CHOL", "HDL", "LDLCALC", "TRIG", "CHOLHDL", "LDLDIRECT" in the last 72 hours. Thyroid function studies No results for input(s): "TSH", "T4TOTAL", "T3FREE", "THYROIDAB" in the last 72 hours.  Invalid input(s): "FREET3" Anemia work up No results for input(s): "VITAMINB12", "FOLATE", "FERRITIN", "TIBC", "IRON", "RETICCTPCT" in the last 72 hours. Urinalysis    Component Value Date/Time   COLORURINE STRAW (A) 01/19/2023 1520   APPEARANCEUR CLEAR 01/19/2023 1520   LABSPEC 1.007 01/19/2023 1520   PHURINE 6.0 01/19/2023 1520   GLUCOSEU >=500 (A) 01/19/2023 1520   HGBUR NEGATIVE 01/19/2023 1520   BILIRUBINUR NEGATIVE 01/19/2023 1520   KETONESUR NEGATIVE 01/19/2023 1520   PROTEINUR NEGATIVE 01/19/2023 1520   NITRITE NEGATIVE 01/19/2023 1520   LEUKOCYTESUR NEGATIVE 01/19/2023 1520   Sepsis Labs Recent Labs  Lab 01/19/23 1230 01/20/23 0351  WBC 8.6 10.2   Microbiology Recent Results (from the past 240 hours)  Resp panel by RT-PCR (RSV, Flu A&B, Covid) Anterior Nasal Swab     Status: None    Collection Time: 01/19/23  2:20 PM   Specimen: Anterior Nasal Swab  Result Value Ref Range Status   SARS Coronavirus 2 by RT PCR NEGATIVE NEGATIVE Final    Comment: (NOTE) SARS-CoV-2 target nucleic acids are NOT DETECTED.  The SARS-CoV-2 RNA is generally detectable in upper respiratory specimens during the acute phase of infection. The lowest concentration of SARS-CoV-2 viral copies this assay can detect is 138 copies/mL. A negative result does not preclude SARS-Cov-2 infection and should not be used as the sole basis for treatment or other patient management decisions. A negative result may occur with  improper specimen collection/handling, submission of specimen other than nasopharyngeal swab, presence of viral mutation(s) within the areas targeted by this assay, and inadequate number of viral copies(<138 copies/mL). A negative result must be combined with clinical observations, patient history, and epidemiological information. The expected result is Negative.  Fact Sheet for Patients:  BloggerCourse.com  Fact Sheet for Healthcare Providers:  SeriousBroker.it  This test is no t yet approved or cleared by the [place] FDA and  has been authorized for detection and/or diagnosis of SARS-CoV-2 by FDA under an Emergency Use Authorization (EUA). This EUA will remain  in effect (meaning this test can be used) for the duration of the COVID-19 declaration under Section 564(b)(1) of the Act, 21 U.S.C.section 360bbb-3(b)(1), unless the authorization is terminated  or revoked sooner.       Influenza A by PCR NEGATIVE NEGATIVE Final   Influenza B by PCR NEGATIVE NEGATIVE Final    Comment: (NOTE) The Xpert Xpress SARS-CoV-2/FLU/RSV plus  assay is intended as an aid in the diagnosis of influenza from Nasopharyngeal swab specimens and should not be used as a sole basis for treatment. Nasal washings and aspirates are unacceptable for  Xpert Xpress SARS-CoV-2/FLU/RSV testing.  Fact Sheet for Patients: BloggerCourse.com  Fact Sheet for Healthcare Providers: SeriousBroker.it  This test is not yet approved or cleared by the [place] FDA and has been authorized for detection and/or diagnosis of SARS-CoV-2 by FDA under an Emergency Use Authorization (EUA). This EUA will remain in effect (meaning this test can be used) for the duration of the COVID-19 declaration under Section 564(b)(1) of the Act, 21 U.S.C. section 360bbb-3(b)(1), unless the authorization is terminated or revoked.     Resp Syncytial Virus by PCR NEGATIVE NEGATIVE Final    Comment: (NOTE) Fact Sheet for Patients: BloggerCourse.com  Fact Sheet for Healthcare Providers: SeriousBroker.it  This test is not yet approved or cleared by the [place] FDA and has been authorized for detection and/or diagnosis of SARS-CoV-2 by FDA under an Emergency Use Authorization (EUA). This EUA will remain in effect (meaning this test can be used) for the duration of the COVID-19 declaration under Section 564(b)(1) of the Act, 21 U.S.C. section 360bbb-3(b)(1), unless the authorization is terminated or revoked.  Performed at [facility], [address] [place], [place] [address]   MRSA Next Gen by PCR, Nasal     Status: None   Collection Time: 01/19/23  4:17 PM   Specimen: Nasal Mucosa; Nasal Swab  Result Value Ref Range Status   MRSA by PCR Next Gen NOT DETECTED NOT DETECTED Final    Comment: (NOTE) The GeneXpert MRSA Assay (FDA approved for NASAL specimens only), is one component of a comprehensive MRSA colonization surveillance program. It is not intended to diagnose MRSA infection nor to guide or monitor treatment for MRSA infections. Test performance is not FDA approved in patients less than 37 years old. Performed at [facility], [address] [place], [place] [address]     FURTHER DISCHARGE INSTRUCTIONS:   Get Medicines reviewed and adjusted: Please take all your medications with you for your next visit with your Primary MD   Laboratory/radiological data: Please request your Primary MD to go over all hospital tests and procedure/radiological results at the follow up, please ask your Primary MD to get all Hospital records sent to his/her office.   In some cases, they will be blood work, cultures and biopsy results pending at the time of your discharge. Please request that your primary care M.D. goes through all the records of your hospital data and follows up on these results.   Also Note the following: If you experience worsening of your admission symptoms, develop shortness of breath, life threatening emergency, suicidal or homicidal thoughts you must seek medical attention immediately by calling 911 or calling your MD immediately  if symptoms less severe.   You must read complete instructions/literature along with all the possible adverse reactions/side effects for all the Medicines you take and that have been prescribed to you. Take any new Medicines after you have completely understood and accpet all the possible adverse reactions/side effects.    Do not drive when taking Pain medications or sleeping medications (Benzodaizepines)   Do not take more than prescribed Pain, Sleep and Anxiety Medications. It is not advisable to combine anxiety,sleep and pain medications without talking with your primary care practitioner   Special Instructions: If you have smoked or chewed Tobacco  in  the last 2 yrs please stop smoking, stop any regular Alcohol  and or any Recreational drug use.   Wear Seat belts while driving.   Please note: You were cared for by a hospitalist during your hospital stay. Once you are discharged, your primary care physician will handle any further medical issues. Please note  that NO REFILLS for any discharge medications will be authorized once you are discharged, as it is imperative that you return to your primary care physician (or establish a relationship with a primary care physician if you do not have one) for your post hospital discharge needs so that they can reassess your need for medications and monitor your lab values  Time coordinating discharge: Over 30 minutes  SIGNED:   Hughie Closs, MD  [facility] 01/21/2023, 9:00 AM *Please note that this is a verbal dictation therefore any spelling or grammatical errors are due to the "Dragon Medical One" system interpretation. If 7PM-7AM, please contact night-coverage [email or web address]

## 2023-01-21 NOTE — Progress Notes (Signed)
Pt discharged to home via [facility]. Pt satisfied all belongs returned, discharge information and education sent with pt, husband informed education is with pt.  Elmer Bales, RN 01/21/23 12:34 PM

## 2023-01-22 ENCOUNTER — Emergency Department (HOSPITAL_COMMUNITY): Payer: 59

## 2023-01-22 ENCOUNTER — Telehealth: Payer: Self-pay | Admitting: *Deleted

## 2023-01-22 ENCOUNTER — Other Ambulatory Visit: Payer: Self-pay

## 2023-01-22 ENCOUNTER — Encounter (HOSPITAL_COMMUNITY): Payer: Self-pay

## 2023-01-22 ENCOUNTER — Telehealth: Payer: Self-pay

## 2023-01-22 ENCOUNTER — Observation Stay (HOSPITAL_COMMUNITY)
Admission: EM | Admit: 2023-01-22 | Discharge: 2023-01-23 | Disposition: A | Discharge status: Home / Self Care | Payer: 59 | Attending: Family Medicine | Admitting: Family Medicine

## 2023-01-22 DIAGNOSIS — I639 Cerebral infarction, unspecified: Secondary | ICD-10-CM | POA: Diagnosis not present

## 2023-01-22 DIAGNOSIS — R4182 Altered mental status, unspecified: Secondary | ICD-10-CM | POA: Insufficient documentation

## 2023-01-22 DIAGNOSIS — Z79899 Other long term (current) drug therapy: Secondary | ICD-10-CM | POA: Diagnosis not present

## 2023-01-22 DIAGNOSIS — Z8679 Personal history of other diseases of the circulatory system: Secondary | ICD-10-CM | POA: Diagnosis not present

## 2023-01-22 DIAGNOSIS — E039 Hypothyroidism, unspecified: Secondary | ICD-10-CM | POA: Diagnosis not present

## 2023-01-22 DIAGNOSIS — J4520 Mild intermittent asthma, uncomplicated: Secondary | ICD-10-CM

## 2023-01-22 DIAGNOSIS — I110 Hypertensive heart disease with heart failure: Secondary | ICD-10-CM | POA: Insufficient documentation

## 2023-01-22 DIAGNOSIS — R739 Hyperglycemia, unspecified: Secondary | ICD-10-CM | POA: Diagnosis not present

## 2023-01-22 DIAGNOSIS — I25118 Atherosclerotic heart disease of native coronary artery with other forms of angina pectoris: Secondary | ICD-10-CM | POA: Insufficient documentation

## 2023-01-22 DIAGNOSIS — E7849 Other hyperlipidemia: Secondary | ICD-10-CM | POA: Diagnosis not present

## 2023-01-22 DIAGNOSIS — F320 Major depressive disorder, single episode, mild: Secondary | ICD-10-CM

## 2023-01-22 DIAGNOSIS — R000 Tachycardia, unspecified: Secondary | ICD-10-CM | POA: Diagnosis not present

## 2023-01-22 DIAGNOSIS — E1143 Type 2 diabetes mellitus with diabetic autonomic (poly)neuropathy: Secondary | ICD-10-CM | POA: Diagnosis not present

## 2023-01-22 DIAGNOSIS — J45909 Unspecified asthma, uncomplicated: Secondary | ICD-10-CM | POA: Diagnosis not present

## 2023-01-22 DIAGNOSIS — Z794 Long term (current) use of insulin: Secondary | ICD-10-CM | POA: Insufficient documentation

## 2023-01-22 DIAGNOSIS — Z86718 Personal history of other venous thrombosis and embolism: Secondary | ICD-10-CM | POA: Diagnosis not present

## 2023-01-22 DIAGNOSIS — I5032 Chronic diastolic (congestive) heart failure: Secondary | ICD-10-CM | POA: Diagnosis not present

## 2023-01-22 DIAGNOSIS — G9341 Metabolic encephalopathy: Principal | ICD-10-CM | POA: Diagnosis present

## 2023-01-22 DIAGNOSIS — Z7902 Long term (current) use of antithrombotics/antiplatelets: Secondary | ICD-10-CM | POA: Insufficient documentation

## 2023-01-22 DIAGNOSIS — K3184 Gastroparesis: Secondary | ICD-10-CM

## 2023-01-22 DIAGNOSIS — F32A Depression, unspecified: Secondary | ICD-10-CM | POA: Diagnosis present

## 2023-01-22 DIAGNOSIS — G4733 Obstructive sleep apnea (adult) (pediatric): Secondary | ICD-10-CM | POA: Diagnosis not present

## 2023-01-22 DIAGNOSIS — G9340 Encephalopathy, unspecified: Secondary | ICD-10-CM | POA: Diagnosis not present

## 2023-01-22 DIAGNOSIS — Z951 Presence of aortocoronary bypass graft: Secondary | ICD-10-CM | POA: Insufficient documentation

## 2023-01-22 DIAGNOSIS — R410 Disorientation, unspecified: Secondary | ICD-10-CM | POA: Diagnosis present

## 2023-01-22 DIAGNOSIS — I771 Stricture of artery: Secondary | ICD-10-CM | POA: Diagnosis not present

## 2023-01-22 DIAGNOSIS — E119 Type 2 diabetes mellitus without complications: Secondary | ICD-10-CM

## 2023-01-22 DIAGNOSIS — R32 Unspecified urinary incontinence: Secondary | ICD-10-CM | POA: Diagnosis present

## 2023-01-22 DIAGNOSIS — Z7901 Long term (current) use of anticoagulants: Secondary | ICD-10-CM | POA: Insufficient documentation

## 2023-01-22 DIAGNOSIS — E1169 Type 2 diabetes mellitus with other specified complication: Secondary | ICD-10-CM

## 2023-01-22 DIAGNOSIS — I2510 Atherosclerotic heart disease of native coronary artery without angina pectoris: Secondary | ICD-10-CM | POA: Insufficient documentation

## 2023-01-22 DIAGNOSIS — N39490 Overflow incontinence: Secondary | ICD-10-CM

## 2023-01-22 DIAGNOSIS — K5900 Constipation, unspecified: Secondary | ICD-10-CM | POA: Diagnosis not present

## 2023-01-22 DIAGNOSIS — F23 Brief psychotic disorder: Secondary | ICD-10-CM | POA: Diagnosis present

## 2023-01-22 DIAGNOSIS — I6782 Cerebral ischemia: Secondary | ICD-10-CM | POA: Diagnosis not present

## 2023-01-22 DIAGNOSIS — Z8673 Personal history of transient ischemic attack (TIA), and cerebral infarction without residual deficits: Secondary | ICD-10-CM

## 2023-01-22 DIAGNOSIS — E785 Hyperlipidemia, unspecified: Secondary | ICD-10-CM | POA: Diagnosis present

## 2023-01-22 DIAGNOSIS — F411 Generalized anxiety disorder: Secondary | ICD-10-CM | POA: Diagnosis present

## 2023-01-22 DIAGNOSIS — I10 Essential (primary) hypertension: Secondary | ICD-10-CM | POA: Diagnosis not present

## 2023-01-22 LAB — SALICYLATE LEVEL: Salicylate Lvl: <7 mg/dL — ABNORMAL LOW (ref 7.0–30.0)

## 2023-01-22 LAB — I-STAT VENOUS BLOOD GAS, ED
Acid-base deficit: 3 mmol/L — ABNORMAL HIGH (ref 0.0–2.0)
Bicarbonate: 20.5 mmol/L (ref 20.0–28.0)
Calcium, Ion: 1.12 mmol/L — ABNORMAL LOW (ref 1.15–1.40)
HCT: 33 % — ABNORMAL LOW (ref 36.0–46.0)
Hemoglobin: 11.2 g/dL — ABNORMAL LOW (ref 12.0–15.0)
O2 Saturation: 76 %
Potassium: 4.4 mmol/L (ref 3.5–5.1)
Sodium: 134 mmol/L — ABNORMAL LOW (ref 135–145)
TCO2: 21 mmol/L — ABNORMAL LOW (ref 22–32)
pCO2, Ven: 29.2 mm[Hg] — ABNORMAL LOW (ref 44–60)
pH, Ven: 7.453 — ABNORMAL HIGH (ref 7.25–7.43)
pO2, Ven: 38 mm[Hg] (ref 32–45)

## 2023-01-22 LAB — CBG MONITORING, ED
Glucose-Capillary: 267 mg/dL — ABNORMAL HIGH (ref 70–99)
Glucose-Capillary: 204 mg/dL — ABNORMAL HIGH (ref 70–99)

## 2023-01-22 LAB — CBC WITH DIFFERENTIAL/PLATELET
Abs Immature Granulocytes: 0.03 10*3/uL (ref 0.00–0.07)
Basophils Absolute: 0.1 10*3/uL (ref 0.0–0.1)
Basophils Relative: 1 %
Eosinophils Absolute: 0.1 10*3/uL (ref 0.0–0.5)
Eosinophils Relative: 1 %
HCT: 32.5 % — ABNORMAL LOW (ref 36.0–46.0)
Hemoglobin: 10.5 g/dL — ABNORMAL LOW (ref 12.0–15.0)
Immature Granulocytes: 0 %
Lymphocytes Relative: 20 %
Lymphs Abs: 2.3 10*3/uL (ref 0.7–4.0)
MCH: 24.4 pg — ABNORMAL LOW (ref 26.0–34.0)
MCHC: 32.3 g/dL (ref 30.0–36.0)
MCV: 75.6 fL — ABNORMAL LOW (ref 80.0–100.0)
Monocytes Absolute: 0.6 10*3/uL (ref 0.1–1.0)
Monocytes Relative: 5 %
Neutro Abs: 8.2 10*3/uL — ABNORMAL HIGH (ref 1.7–7.7)
Neutrophils Relative %: 73 %
Platelets: 413 10*3/uL — ABNORMAL HIGH (ref 150–400)
RBC: 4.3 MIL/uL (ref 3.87–5.11)
RDW: 16.3 % — ABNORMAL HIGH (ref 11.5–15.5)
WBC: 11.2 10*3/uL — ABNORMAL HIGH (ref 4.0–10.5)
nRBC: 0 % (ref 0.0–0.2)

## 2023-01-22 LAB — COMPREHENSIVE METABOLIC PANEL
ALT: 13 U/L (ref 0–44)
AST: 15 U/L (ref 15–41)
Albumin: 3.4 g/dL — ABNORMAL LOW (ref 3.5–5.0)
Alkaline Phosphatase: 69 U/L (ref 38–126)
Anion gap: 11 (ref 5–15)
BUN: 18 mg/dL (ref 6–20)
CO2: 20 mmol/L — ABNORMAL LOW (ref 22–32)
Calcium: 9.3 mg/dL (ref 8.9–10.3)
Chloride: 103 mmol/L (ref 98–111)
Creatinine, Ser: 0.98 mg/dL (ref 0.44–1.00)
GFR, Estimated: >60 mL/min (ref >60)
Glucose, Bld: 265 mg/dL — ABNORMAL HIGH (ref 70–99)
Potassium: 4.6 mmol/L (ref 3.5–5.1)
Sodium: 134 mmol/L — ABNORMAL LOW (ref 135–145)
Total Bilirubin: 0.6 mg/dL (ref <1.2)
Total Protein: 7 g/dL (ref 6.5–8.1)

## 2023-01-22 LAB — ETHANOL: Alcohol, Ethyl (B): <10 mg/dL (ref <10)

## 2023-01-22 LAB — HCG, QUANTITATIVE, PREGNANCY: hCG, Beta Chain, Quant, S: 6 m[IU]/mL — ABNORMAL HIGH (ref <5)

## 2023-01-22 LAB — ACETAMINOPHEN LEVEL: Acetaminophen (Tylenol), Serum: <10 ug/mL — ABNORMAL LOW (ref 10–30)

## 2023-01-22 LAB — TSH: TSH: 1.441 u[IU]/mL (ref 0.350–4.500)

## 2023-01-22 LAB — T4, FREE: Free T4: 1.06 ng/dL (ref 0.61–1.12)

## 2023-01-22 MED ORDER — TAMSULOSIN HCL 0.4 MG PO CAPS
0.4000 mg | ORAL_CAPSULE | Freq: Every day | ORAL | Status: DC
  Administered 2023-01-22: 0.4 mg via ORAL
  Filled 2023-01-22: qty 1

## 2023-01-22 MED ORDER — INSULIN LISPRO 100 UNIT/ML IJ SOLN: 4.0000 [IU] | Freq: Three times a day (TID) | INTRAMUSCULAR | Status: DC

## 2023-01-22 MED ORDER — INSULIN ASPART 100 UNIT/ML IJ SOLN
4.0000 [IU] | Freq: Three times a day (TID) | INTRAMUSCULAR | Status: DC
  Administered 2023-01-23: 4 [IU] via SUBCUTANEOUS

## 2023-01-22 MED ORDER — SODIUM CHLORIDE 0.9% FLUSH
3.0000 mL | Freq: Two times a day (BID) | INTRAVENOUS | Status: DC
  Administered 2023-01-22 – 2023-01-23 (×2): 3 mL via INTRAVENOUS

## 2023-01-22 MED ORDER — METOCLOPRAMIDE HCL 10 MG PO TABS
5.0000 mg | ORAL_TABLET | Freq: Three times a day (TID) | ORAL | Status: DC
  Administered 2023-01-23: 5 mg via ORAL
  Filled 2023-01-22: qty 1

## 2023-01-22 MED ORDER — ESCITALOPRAM OXALATE 10 MG PO TABS
20.0000 mg | ORAL_TABLET | Freq: Every morning | ORAL | Status: DC
  Administered 2023-01-23: 20 mg via ORAL
  Filled 2023-01-22: qty 2

## 2023-01-22 MED ORDER — ACETAMINOPHEN 325 MG PO TABS: 650.0000 mg | ORAL_TABLET | Freq: Four times a day (QID) | ORAL | Status: DC | PRN

## 2023-01-22 MED ORDER — BACLOFEN 10 MG PO TABS
10.0000 mg | ORAL_TABLET | Freq: Three times a day (TID) | ORAL | Status: DC
Start: 2023-01-22 — End: 2023-01-23
  Administered 2023-01-22 – 2023-01-23 (×2): 10 mg via ORAL
  Filled 2023-01-22 (×2): qty 1

## 2023-01-22 MED ORDER — GABAPENTIN 100 MG PO CAPS
100.0000 mg | ORAL_CAPSULE | Freq: Three times a day (TID) | ORAL | Status: DC
  Administered 2023-01-22 – 2023-01-23 (×2): 100 mg via ORAL
  Filled 2023-01-22 (×2): qty 1

## 2023-01-22 MED ORDER — POLYETHYLENE GLYCOL 3350 17 G PO PACK
17.0000 g | PACK | Freq: Every day | ORAL | Status: DC
  Administered 2023-01-23: 17 g via ORAL
  Filled 2023-01-22: qty 1

## 2023-01-22 MED ORDER — PANTOPRAZOLE SODIUM 40 MG PO TBEC
40.0000 mg | DELAYED_RELEASE_TABLET | Freq: Every day | ORAL | Status: DC
  Administered 2023-01-23: 40 mg via ORAL
  Filled 2023-01-22: qty 1

## 2023-01-22 MED ORDER — METOPROLOL TARTRATE 25 MG PO TABS: 100.0000 mg | ORAL_TABLET | Freq: Two times a day (BID) | ORAL | Status: DC

## 2023-01-22 MED ORDER — TOPIRAMATE 25 MG PO TABS
25.0000 mg | ORAL_TABLET | Freq: Two times a day (BID) | ORAL | Status: DC
  Administered 2023-01-22 – 2023-01-23 (×2): 25 mg via ORAL
  Filled 2023-01-22 (×2): qty 1

## 2023-01-22 MED ORDER — CLOPIDOGREL BISULFATE 75 MG PO TABS
75.0000 mg | ORAL_TABLET | Freq: Every day | ORAL | Status: DC
  Administered 2023-01-23: 75 mg via ORAL
  Filled 2023-01-22: qty 1

## 2023-01-22 MED ORDER — SODIUM CHLORIDE 0.9% FLUSH
3.0000 mL | Freq: Two times a day (BID) | INTRAVENOUS | Status: DC
Start: 2023-01-22 — End: 2023-01-23
  Administered 2023-01-22 – 2023-01-23 (×2): 3 mL via INTRAVENOUS

## 2023-01-22 MED ORDER — SODIUM CHLORIDE 0.9 % IV SOLN: 250.0000 mL | INTRAVENOUS | Status: DC | PRN

## 2023-01-22 MED ORDER — EZETIMIBE 10 MG PO TABS
10.0000 mg | ORAL_TABLET | Freq: Every day | ORAL | Status: DC
  Administered 2023-01-23: 10 mg via ORAL
  Filled 2023-01-22: qty 1

## 2023-01-22 MED ORDER — CYCLOBENZAPRINE HCL 10 MG PO TABS: 5.0000 mg | ORAL_TABLET | Freq: Three times a day (TID) | ORAL | Status: DC | PRN

## 2023-01-22 MED ORDER — ONDANSETRON HCL 4 MG/2ML IJ SOLN: 4.0000 mg | Freq: Four times a day (QID) | INTRAMUSCULAR | Status: DC | PRN

## 2023-01-22 MED ORDER — LORAZEPAM 1 MG PO TABS: 0.5000 mg | ORAL_TABLET | Freq: Every evening | ORAL | Status: DC | PRN

## 2023-01-22 MED ORDER — METHIMAZOLE 5 MG PO TABS
5.0000 mg | ORAL_TABLET | Freq: Every morning | ORAL | Status: DC
  Administered 2023-01-23: 5 mg via ORAL
  Filled 2023-01-22: qty 1

## 2023-01-22 MED ORDER — ACETAMINOPHEN 650 MG RE SUPP: 650.0000 mg | Freq: Four times a day (QID) | RECTAL | Status: DC | PRN

## 2023-01-22 MED ORDER — BISACODYL 5 MG PO TBEC: 5.0000 mg | DELAYED_RELEASE_TABLET | Freq: Every day | ORAL | Status: DC | PRN

## 2023-01-22 MED ORDER — INSULIN ASPART 100 UNIT/ML IJ SOLN
0.0000 [IU] | Freq: Three times a day (TID) | INTRAMUSCULAR | Status: DC
  Administered 2023-01-23: 5 [IU] via SUBCUTANEOUS

## 2023-01-22 MED ORDER — INSULIN GLARGINE-YFGN 100 UNIT/ML ~~LOC~~ SOLN: 20.0000 [IU] | Freq: Every day | SUBCUTANEOUS | Status: DC

## 2023-01-22 MED ORDER — ONDANSETRON HCL 4 MG PO TABS: 4.0000 mg | ORAL_TABLET | Freq: Four times a day (QID) | ORAL | Status: DC | PRN

## 2023-01-22 MED ORDER — SODIUM CHLORIDE 0.9% FLUSH
3.0000 mL | INTRAVENOUS | Status: DC | PRN
Start: 2023-01-22 — End: 2023-01-23

## 2023-01-22 MED ORDER — ALBUTEROL SULFATE (2.5 MG/3ML) 0.083% IN NEBU: 2.5000 mg | INHALATION_SOLUTION | Freq: Four times a day (QID) | RESPIRATORY_TRACT | Status: DC | PRN

## 2023-01-22 MED ORDER — RIVAROXABAN 10 MG PO TABS: 10.0000 mg | ORAL_TABLET | Freq: Every day | ORAL | Status: DC

## 2023-01-22 MED ORDER — INSULIN ASPART 100 UNIT/ML IJ SOLN
0.0000 [IU] | Freq: Every day | INTRAMUSCULAR | Status: DC
  Administered 2023-01-22: 2 [IU] via SUBCUTANEOUS

## 2023-01-22 MED ORDER — RANOLAZINE ER 500 MG PO TB12
500.0000 mg | ORAL_TABLET | Freq: Two times a day (BID) | ORAL | Status: DC
  Administered 2023-01-22 – 2023-01-23 (×2): 500 mg via ORAL
  Filled 2023-01-22 (×2): qty 1
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
## 2023-01-22 NOTE — Transitions of Care (Post Inpatient/ED Visit) (Signed)
   01/22/2023  Name: Lori [name] MRN: [id number] DOB: 1970-12-12  Today's TOC FU Call Status: Today's TOC FU Call Status:: Unsuccessful Call (1st Attempt) Unsuccessful Call (1st Attempt) Date: 01/22/23  Attempted to reach the patient regarding the most recent Inpatient/ED visit.  Follow Up Plan: No further outreach attempts will be made at this time. We have been unable to contact the patient.  Gean Maidens BSN RN Population Health- Transition of Care Team.  [facility] [phone]
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
## 2023-01-22 NOTE — ED Notes (Signed)
Pt is repeatedly asking for fluids through her "IV because I am dehydrated" she continues to also ask for a room.
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
## 2023-01-22 NOTE — ED Notes (Signed)
Pt incontinent of stool; pt cleaned, linens changed

## 2023-01-22 NOTE — ED Notes (Signed)
Pt incontinent of urine, changed pt and clean brief and chuck placed under pt.

## 2023-01-22 NOTE — Transitions of Care (Post Inpatient/ED Visit) (Signed)
   01/22/2023  Name: Lori [name] MRN: [id number] DOB: 06-11-1970  Today's TOC FU Call Status: Today's TOC FU Call Status:: Unsuccessful Call (2nd Attempt) Unsuccessful Call (2nd Attempt) Date: 01/22/23  Attempted to reach the patient regarding the most recent Inpatient/ED visit. Patient noted to be roomed in ED at this time.   Follow Up Plan: Additional outreach attempts will be made to reach the patient to complete the Transitions of Care (Post Inpatient/ED visit) call.    Gabriel Cirri MSN, RN [place]  [facility] Management Coordinator [email or web address] Direct Dial: [phone] Fax: [phone]

## 2023-01-22 NOTE — ED Notes (Signed)
Pt incontinent of bowel, cleaned pt and new brief placed

## 2023-01-23 ENCOUNTER — Encounter (HOSPITAL_COMMUNITY): Payer: Self-pay | Admitting: Internal Medicine

## 2023-01-23 DIAGNOSIS — F411 Generalized anxiety disorder: Secondary | ICD-10-CM | POA: Diagnosis not present

## 2023-01-23 DIAGNOSIS — F23 Brief psychotic disorder: Secondary | ICD-10-CM | POA: Diagnosis not present

## 2023-01-23 DIAGNOSIS — Z7401 Bed confinement status: Secondary | ICD-10-CM | POA: Diagnosis not present

## 2023-01-23 DIAGNOSIS — R531 Weakness: Secondary | ICD-10-CM | POA: Diagnosis not present

## 2023-01-23 DIAGNOSIS — G9341 Metabolic encephalopathy: Secondary | ICD-10-CM | POA: Diagnosis not present

## 2023-01-23 DIAGNOSIS — Z743 Need for continuous supervision: Secondary | ICD-10-CM | POA: Diagnosis not present

## 2023-01-23 DIAGNOSIS — F32A Depression, unspecified: Secondary | ICD-10-CM | POA: Diagnosis not present

## 2023-01-23 LAB — CBG MONITORING, ED: Glucose-Capillary: 210 mg/dL — ABNORMAL HIGH (ref 70–99)

## 2023-01-23 LAB — CBC
HCT: 31.2 % — ABNORMAL LOW (ref 36.0–46.0)
Hemoglobin: 10.1 g/dL — ABNORMAL LOW (ref 12.0–15.0)
MCH: 24.4 pg — ABNORMAL LOW (ref 26.0–34.0)
MCHC: 32.4 g/dL (ref 30.0–36.0)
MCV: 75.4 fL — ABNORMAL LOW (ref 80.0–100.0)
Platelets: 386 10*3/uL (ref 150–400)
RBC: 4.14 MIL/uL (ref 3.87–5.11)
RDW: 16.3 % — ABNORMAL HIGH (ref 11.5–15.5)
WBC: 9.2 10*3/uL (ref 4.0–10.5)
nRBC: 0 % (ref 0.0–0.2)

## 2023-01-23 LAB — COMPREHENSIVE METABOLIC PANEL
ALT: 12 U/L (ref 0–44)
AST: 13 U/L — ABNORMAL LOW (ref 15–41)
Albumin: 3.1 g/dL — ABNORMAL LOW (ref 3.5–5.0)
Alkaline Phosphatase: 60 U/L (ref 38–126)
Anion gap: 9 (ref 5–15)
BUN: 16 mg/dL (ref 6–20)
CO2: 21 mmol/L — ABNORMAL LOW (ref 22–32)
Calcium: 8.8 mg/dL — ABNORMAL LOW (ref 8.9–10.3)
Chloride: 105 mmol/L (ref 98–111)
Creatinine, Ser: 1.04 mg/dL — ABNORMAL HIGH (ref 0.44–1.00)
GFR, Estimated: >60 mL/min (ref >60)
Glucose, Bld: 205 mg/dL — ABNORMAL HIGH (ref 70–99)
Potassium: 3.9 mmol/L (ref 3.5–5.1)
Sodium: 135 mmol/L (ref 135–145)
Total Bilirubin: 0.4 mg/dL (ref <1.2)
Total Protein: 6.5 g/dL (ref 6.5–8.1)

## 2023-01-23 MED ORDER — METOPROLOL TARTRATE 25 MG PO TABS: 100.0000 mg | ORAL_TABLET | Freq: Two times a day (BID) | ORAL | Status: DC

## 2023-01-23 MED ORDER — INSULIN GLARGINE-YFGN 100 UNIT/ML ~~LOC~~ SOLN
20.0000 [IU] | Freq: Every day | SUBCUTANEOUS | Status: DC
  Administered 2023-01-23: 20 [IU] via SUBCUTANEOUS
  Filled 2023-01-23: qty 0.2

## 2023-01-23 MED ORDER — RIVAROXABAN 10 MG PO TABS
10.0000 mg | ORAL_TABLET | Freq: Every day | ORAL | Status: DC
  Administered 2023-01-23: 10 mg via ORAL
  Filled 2023-01-23: qty 1

## 2023-01-23 MED ORDER — METOPROLOL TARTRATE 25 MG PO TABS
100.0000 mg | ORAL_TABLET | Freq: Two times a day (BID) | ORAL | Status: DC
  Administered 2023-01-23: 100 mg via ORAL
  Filled 2023-01-23: qty 4

## 2023-01-23 NOTE — Hospital Course (Signed)
52 year old female Prior CVA?  01/2021 with CIR stay with left hemiplegia dysarthria mostly wheelchair-bound She has some chronic dysphagia and has been admitted several times for projectile nausea vomiting in the setting of gastroparesis/hyperosmolar state Pheochromocytoma status post adrenalectomy 2018 CAD status post CABG in [place] 2018 DM TY 2 complication gastroparesis nephropathy Previous evaluation by psychiatry 04/2021 for suicidal ideation-did not have any hallucinations at the time just was depressed and felt like she was a burden on her family and was on Lexapro  Prior admission diabetic gastroparesis 11/20-11/23 Recently admitted 12/15 through 01/21/2023 metabolic encephalopathy probably secondary to diabetic state-at that admission she had spastic paresis and?  New onset strokelike symptoms She presented with confusion, delusions, has been trying to kill her and called 911  sodium 134 potassium 4.6 BUN/creatinine 19/0.9 WBC 11.2 hemoglobin 10.5 platelet 413 Hemoglobin 9.9 platelet 342 white count 8.2 CT head showed no intracranial abnormality chronic microvascular disease with remote infarcts
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
## 2023-01-23 NOTE — ED Notes (Signed)
Phlebotomy unable to collect blood.

## 2023-01-23 NOTE — Discharge Instructions (Signed)
Please perform close follow-up with primary care services.   Please perform close follow-up with outpatient psychiatric services for therapy and medication management.  Please strictly adhere to safety plan listed below.  Safety Plan Lori [name] will reach out to Lori [name],Lori [name] (Spouse), call 911 or call mobile crisis, or go to nearest emergency room if condition worsens or if suicidal thoughts become active Patients' will follow up with PCP and/or [facility] for outpatient psychiatric services (therapy/medication management).  The suicide prevention education provided includes the following: Suicide risk factors Suicide prevention and interventions National Suicide Hotline telephone number [facility] assessment telephone number [facility] Emergency Assistance 911 [facility] and/or Residential Mobile Crisis Unit telephone number Request made of family/significant other to:  Lori [name],Lori [name] (Spouse)  Remove weapons (e.g., guns, rifles, knives), all items previously/currently identified as safety concern.   Remove drugs/medications (over the counter, prescriptions, illicit drugs), all items previously/currently identified as a safety concern.

## 2023-01-23 NOTE — Care Management Obs Status (Signed)
MEDICARE OBSERVATION STATUS NOTIFICATION   Patient Details  Name: Lori [name] MRN: [id number] Date of Birth: 03-30-1970   Medicare Observation Status Notification Given:  Yes    Oletta Cohn, RN 01/23/2023, 11:09 AM

## 2023-01-23 NOTE — Care Management CC44 (Signed)
Condition Code 44 Documentation Completed  Patient Details  Name: Lori [name] MRN: [id number] Date of Birth: 11/09/1970   Condition Code 44 given:  Yes Patient signature on Condition Code 44 notice:  Yes Documentation of 2 MD's agreement:  Yes Code 44 added to claim:  Yes    Oletta Cohn, RN 01/23/2023, 11:09 AM

## 2023-01-23 NOTE — Discharge Summary (Signed)
Physician Discharge Summary  Angely Ponds [name] VHQ:[id number] DOB: 1970-10-08 DOA: 01/22/2023  PCP: Caesar Bookman, NP  Admit date: 01/22/2023 Discharge date: 01/23/2023  Time spent: 24 minutes  Recommendations for Outpatient Follow-up:  Needs out-patient good glycemic control Check Chem-12 CBC in about a week No med changes were made  Discharge Diagnoses:  MAIN problem for hospitalization   Acute metabolic encephalopathy probably because of lack of sleep in addition to hyperglycemia  Please see below for itemized issues addressed in HOpsital- refer to other progress notes for clarity if needed  Discharge Condition: Improved  Diet recommendation: Diabetic  Filed Weights   01/23/23 0138  Weight: 88.4 kg    History of present illness:  52 year old female Prior CVA?  01/2021 with CIR stay with left hemiplegia dysarthria mostly wheelchair-bound She has some chronic dysphagia and has been admitted several times for projectile nausea vomiting in the setting of gastroparesis/hyperosmolar state Pheochromocytoma status post adrenalectomy 2018 CAD status post CABG in [place] 2018 DM TY 2 complication gastroparesis nephropathy Previous evaluation by psychiatry 04/2021 for suicidal ideation-did not have any hallucinations at the time just was depressed and felt like she was a burden on her family and was on Lexapro  Prior admission diabetic gastroparesis 11/20-11/23 Recently admitted 12/15 through 01/21/2023 metabolic encephalopathy probably secondary to diabetic state-at that admission she had spastic paresis and?  New onset strokelike symptoms She presented with confusion, delusions, has been trying to kill her and called 911  sodium 134 potassium 4.6 BUN/creatinine 19/0.9 WBC 11.2 hemoglobin 10.5 platelet 413 Hemoglobin 9.9 platelet 342 white count 8.2 CT head showed no intracranial abnormality chronic microvascular disease with remote  infarcts  Patient was observed in the emergency room and came back to baseline and was able to tell me date time year as well as name the president and realized that she was talking nonsensically-this was confirmed with her husband on the phone who tells me that this happens occasionally when her sugars are really high Given that she has had negative metabolic workup, had a recent extensive workup for stroke with no evidence of stroke this admission either Her x-ray is negative and she has no concerns for overt infection, I feel that patient can discharge home with follow-up with her primary physician-she was eating in the room this morning and was completely coherent and I have let her husband know our findings and her status She is stable to discharge home   Discharge Exam: Vitals:   01/23/23 0858 01/23/23 0900  BP: 120/79 114/79  Pulse: (!) 123 (!) 124  Resp: 20   Temp: 98.6 F (37 C)   SpO2: 99%     Subj on day of d/c   Awake coherent eating breakfast talking making sense no fever chills nausea vomiting  General Exam on discharge  EOMI NCAT no focal deficit no icterus no pallor no rales no rhonchi Chest is clearNo wheeze rales rhonchi Abdomen is soft no rebound no guarding Power is 5/5 although she has contracture of her left upper extremity No lower extremity edema   Discharge Instructions   Discharge Instructions     Diet - low sodium heart healthy   Complete by: As directed    Discharge instructions   Complete by: As directed    Please take your medications as indicated do not change them and please keep a close control of your blood sugar I would recommend that you follow-up with your regular physician in the outpatient setting make sure  you do not let your sugars go too high It would be a good idea for you to get labs in about a week We have not changed any of your meds   Increase activity slowly   Complete by: As directed       Allergies as of 01/23/2023        Reactions   Asa [aspirin] Anaphylaxis, Swelling, Other (See Comments)   Throat closes   Contrast Media [iodinated Contrast Media] Anaphylaxis, Swelling   Imitrex [sumatriptan] Anaphylaxis   Ms Contin [morphine] Anaphylaxis, Swelling   Penicillins Swelling, Other (See Comments)   Mouth and eyes swell   Firvanq [vancomycin] Other (See Comments)   Red Man's Syndrome   Cipro [ciprofloxacin Hcl] Nausea And Vomiting   Nitrofuran Derivatives Anxiety   Wound Dressing Adhesive Itching, Rash        Medication List     STOP taking these medications    olmesartan-hydrochlorothiazide 40-25 MG tablet Commonly known as: BENICAR HCT       TAKE these medications    albuterol 108 (90 Base) MCG/ACT inhaler Commonly known as: VENTOLIN HFA INHALE 2 PUFFS BY MOUTH EVERY 6 HOURS AS NEEDED FOR WHEEZING AND FOR SHORTNESS OF BREATH Strength: 108 (90 Base) MCG/ACT What changed:  how much to take how to take this when to take this reasons to take this additional instructions   albuterol (2.5 MG/3ML) 0.083% nebulizer solution Commonly known as: PROVENTIL Take 3 mLs (2.5 mg total) by nebulization every 6 (six) hours as needed for wheezing or shortness of breath. What changed: Another medication with the same name was changed. Make sure you understand how and when to take each.   amLODipine 5 MG tablet Commonly known as: NORVASC Take 10 mg by mouth in the morning.   baclofen 10 MG tablet Commonly known as: LIORESAL Take 1 tablet (10 mg total) by mouth 3 (three) times daily.   bisacodyl 5 MG EC tablet Commonly known as: DULCOLAX Take 1 tablet (5 mg total) by mouth daily as needed for moderate constipation.   clopidogrel 75 MG tablet Commonly known as: PLAVIX Take 1 tablet (75 mg total) by mouth daily.   cyclobenzaprine 5 MG tablet Commonly known as: FLEXERIL TAKE 1 TABLET BY MOUTH 3 TIMES DAILY AS NEEDED FOR MUSCLE SPASMS   escitalopram 20 MG tablet Commonly known as:  LEXAPRO Take 1 tablet (20 mg total) by mouth every morning.   ezetimibe 10 MG tablet Commonly known as: Zetia Take 1 tablet (10 mg total) by mouth daily.   FreeStyle Libre 14 Day Sensor Misc 1 each by Does not apply route as needed. DX: E11.69   FreeStyle Libre 2 Reader Devi 1 Device by Does not apply route daily. E11.69   gabapentin 100 MG capsule Commonly known as: NEURONTIN Take 100 mg by mouth 3 (three) times daily.   insulin lispro 100 UNIT/ML injection Commonly known as: HUMALOG Sliding scale AC, HS CBG 121 - 150: 2 units  CBG 151 - 200: 3 units  CBG 201 - 250: 5 units  CBG 251 - 300: 8 units  CBG 301 - 350: 11 units  CBG 351 - 400: 15 units  CBG > 400: call MD   insulin lispro 100 UNIT/ML injection Commonly known as: HUMALOG Inject 0.04 mLs (4 Units total) into the skin 3 (three) times daily with meals.   Lantus SoloStar 100 UNIT/ML Solostar Pen Generic drug: insulin glargine Inject 20 Units into the skin at bedtime. What changed: how much  to take   LORazepam 0.5 MG tablet Commonly known as: ATIVAN Take 1 tablet (0.5 mg total) by mouth at bedtime.   methimazole 5 MG tablet Commonly known as: TAPAZOLE Take 1 tablet (5 mg total) by mouth every morning.   metoCLOPramide 5 MG tablet Commonly known as: REGLAN Take 1 tablet (5 mg total) by mouth 3 (three) times daily before meals.   metoprolol tartrate 100 MG tablet Commonly known as: LOPRESSOR Take 1 tablet (100 mg total) by mouth 2 (two) times daily.   omeprazole 20 MG capsule Commonly known as: PRILOSEC TAKE 1 CAPSULE(20 MG) BY MOUTH DAILY   polyethylene glycol powder 17 GM/SCOOP powder Commonly known as: GLYCOLAX/MIRALAX Take 17 g by mouth daily. Hold for loose stool   ranolazine 500 MG 12 hr tablet Commonly known as: RANEXA Take 1 tablet (500 mg total) by mouth 2 (two) times daily.   rivaroxaban 10 MG Tabs tablet Commonly known as: XARELTO Take 1 tablet (10 mg total) by mouth daily.    tamsulosin 0.4 MG Caps capsule Commonly known as: FLOMAX Take 0.4 mg by mouth at bedtime.   topiramate 25 MG tablet Commonly known as: TOPAMAX Take 1 tablet (25 mg total) by mouth 2 (two) times daily.   Voltaren 1 % Gel Generic drug: diclofenac Sodium Apply 2 g topically 4 (four) times daily.   zinc oxide 11.3 % Crea cream Commonly known as: BALMEX Apply 1 Application topically 2 (two) times daily.       Allergies  Allergen Reactions   Asa [Aspirin] Anaphylaxis, Swelling and Other (See Comments)    Throat closes   Contrast Media [Iodinated Contrast Media] Anaphylaxis and Swelling   Imitrex [Sumatriptan] Anaphylaxis   Ms Contin [Morphine] Anaphylaxis and Swelling   Penicillins Swelling and Other (See Comments)    Mouth and eyes swell   Firvanq [Vancomycin] Other (See Comments)    Red Man's Syndrome   Cipro [Ciprofloxacin Hcl] Nausea And Vomiting   Nitrofuran Derivatives Anxiety   Wound Dressing Adhesive Itching and Rash      The results of significant diagnostics from this hospitalization (including imaging, microbiology, ancillary and laboratory) are listed below for reference.    Significant Diagnostic Studies: DG Chest 2 View Result Date: 01/22/2023 CLINICAL DATA:  Altered mental status. Restricted left arm movement. EXAM: CHEST - 2 VIEW COMPARISON:  01/19/2023 FINDINGS: The lateral view is limited by overlapping of the patient's arm. Normal sized heart. Clear lungs with normal vascularity. Median sternotomy wires. Mildly tortuous and partially calcified thoracic aorta. Unremarkable bones. IMPRESSION: No acute abnormality. Electronically Signed   By: Beckie Salts M.D.   On: 01/22/2023 17:25   CT Head Wo Contrast Result Date: 01/22/2023 CLINICAL DATA:  Mental status change, unknown cause EXAM: CT HEAD WITHOUT CONTRAST TECHNIQUE: Contiguous axial images were obtained from the base of the skull through the vertex without intravenous contrast. RADIATION DOSE REDUCTION:  This exam was performed according to the departmental dose-optimization program which includes automated exposure control, adjustment of the mA and/or kV according to patient size and/or use of iterative reconstruction technique. COMPARISON:  CT head 01/20/2023. FINDINGS: Brain: No evidence of acute infarction, hemorrhage, hydrocephalus, extra-axial collection or mass lesion/mass effect. Remote left cerebellar infarct. Remote basal ganglia lacunar infarcts. Remote left frontal cortical infarct. Patchy white matter hypodensities are nonspecific but compatible with chronic microvascular disease. Vascular: No hyperdense vessel. Skull: No acute fracture. Sinuses/Orbits: Clear sinuses.  No acute orbital findings. Other: No mastoid effusions. IMPRESSION: 1. No evidence of acute  intracranial abnormality. 2. 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 Electronically Signed   By: Feliberto Harts M.D.   On: 01/22/2023 16:44   MR BRAIN WO CONTRAST Result Date: 01/20/2023 CLINICAL DATA:  Neuro deficit, acute, stroke suspected. Right-sided numbness and slurred speech. EXAM: MRI HEAD WITHOUT CONTRAST TECHNIQUE: Multiplanar, multiecho pulse sequences of the brain and surrounding structures were obtained without intravenous contrast. COMPARISON:  Head CT 01/20/2023 and MRI 11/04/2022 FINDINGS: A complete MRI could not be obtained due to the patient's condition. Axial and coronal diffusion, sagittal T1, axial and coronal T2, and axial FLAIR sequences were obtained and are motion degraded, including severe motion on the coronal T2 sequence. Brain: Diffusion-weighted imaging is of good quality, and there is no evidence of an acute infarct. Advanced chronic small-vessel ischemic disease is again noted in the cerebral white matter with chronic infarcts in the deep white matter and deep gray nuclei bilaterally. A small chronic left frontal cortical infarct and moderate-sized chronic left cerebellar infarcts are  also again noted. There is wallerian degeneration involving the brainstem on the right. There is mild cerebral atrophy. Lack of susceptibility weighted or gradient echo imaging limits assessment for intracranial hemorrhage. There is no midline shift or extra-axial fluid collection. Vascular: Absent flow void in the distal left vertebral artery, unchanged from the prior MRI and which may reflect chronic occlusion or slow flow. Skull and upper cervical spine: Unremarkable bone marrow signal. Sinuses/Orbits: Grossly unchanged 1 cm FLAIR hyperintense nodule at the anterosuperior aspect of the left orbit as described on the prior MRI. Clear sinuses. Other: None. IMPRESSION: 1. Motion degraded, incomplete examination. 2. No acute infarct. 3. Advanced chronic ischemia with multiple old infarcts. Electronically Signed   By: Sebastian Ache M.D.   On: 01/20/2023 09:33   CT HEAD CODE STROKE WO CONTRAST Addendum Date: 01/20/2023 ADDENDUM REPORT: 01/20/2023 08:47 ADDENDUM: Study briefly discussed by telephone with Dr. Hughie Closs on 01/20/2023 at 0840 hours. And these results were communicated to Dr. Iver Nestle at 8:47 am on 01/20/2023 by text page via the [facility] messaging system. Electronically Signed   By: Odessa Fleming M.D.   On: 01/20/2023 08:47   Result Date: 01/20/2023 CLINICAL DATA:  Code stroke. 52 year old female right side numbness, abnormal speech. Last seen well 0720 hours. EXAM: CT HEAD WITHOUT CONTRAST TECHNIQUE: Contiguous axial images were obtained from the base of the skull through the vertex without intravenous contrast. RADIATION DOSE REDUCTION: This exam was performed according to the departmental dose-optimization program which includes automated exposure control, adjustment of the mA and/or kV according to patient size and/or use of iterative reconstruction technique. COMPARISON:  Brain MRI 11/03/2022.  Head CT yesterday. FINDINGS: Brain: Advanced chronic ischemic disease. Chronic encephalomalacia in the left  cerebellum PICA territory, bilateral deep gray nuclei, bilateral corona radiata with some involvement of the body of the corpus callosum, left MCA anterior division. Stable cerebral volume. No midline shift, mass effect, or evidence of intracranial mass lesion. No ventriculomegaly. No acute intracranial hemorrhage identified. No cortically based acute infarct identified. Vascular: Extensive Calcified atherosclerosis at the skull base. No suspicious intracranial vascular hyperdensity. Skull: Stable and intact. Sinuses/Orbits: Visualized paranasal sinuses and mastoids are clear. Other: No gaze deviation, acute orbit or scalp soft tissue finding. ASPECTS [facility] Stroke Program Early CT Score) Total score (0-10 with 10 being normal): 10, chronic encephalomalacia. IMPRESSION: Stable non contrast CT appearance of advanced chronic ischemic disease. No acute cortically based infarct or acute intracranial hemorrhage identified. ASPECTS 10. Electronically Signed: By: Rexene Edison  Margo Aye M.D. On: 01/20/2023 08:35   CT Head Wo Contrast Result Date: 01/19/2023 CLINICAL DATA:  52 year old female with history of altered mental status. EXAM: CT HEAD WITHOUT CONTRAST TECHNIQUE: Contiguous axial images were obtained from the base of the skull through the vertex without intravenous contrast. RADIATION DOSE REDUCTION: This exam was performed according to the departmental dose-optimization program which includes automated exposure control, adjustment of the mA and/or kV according to patient size and/or use of iterative reconstruction technique. COMPARISON:  Head CT 11/22/2022. FINDINGS: Brain: Mild cerebral and cerebellar atrophy. Patchy and confluent areas of decreased attenuation are noted throughout the deep and periventricular white matter of the cerebral hemispheres bilaterally, compatible with chronic microvascular ischemic disease. Multiple more well-defined areas of low attenuation in the basal ganglia bilaterally (right greater than  left), similar to prior studies, compatible with old lacunar infarcts. There is also well-defined low-attenuation in the left frontal lobe and the left cerebellar hemisphere, compatible with areas of encephalomalacia from remote infarcts, similar to the prior study. No evidence of acute infarction, hemorrhage, hydrocephalus, extra-axial collection or mass lesion/mass effect. Vascular: No hyperdense vessel or unexpected calcification. Skull: Normal. Negative for fracture or focal lesion. Sinuses/Orbits: No acute finding. Other: None. IMPRESSION: 1. No acute intracranial abnormalities. 2. Mild cerebral and cerebellar atrophy with extensive chronic microvascular ischemic changes in the cerebral white matter, old basal ganglia lacunar infarcts bilaterally, and old left frontal and left cerebellar hemisphere infarcts, similar to prior examination. Electronically Signed   By: Trudie Reed M.D.   On: 01/19/2023 12:13   DG Chest Portable 1 View Result Date: 01/19/2023 CLINICAL DATA:  52 year old female with shortness of breath, hypoglycemia, lethargy. EXAM: PORTABLE CHEST 1 VIEW COMPARISON:  Portable chest 12/25/2022 and earlier. FINDINGS: Portable AP semi upright view at 1138 hours. Low lung volumes, similar to prior. Chronic sternotomy. Mediastinal contours remain within normal limits. Visualized tracheal air column is within normal limits. Allowing for portable technique the lungs are clear. Evidence of right lung azygous vein and fissure (normal variant). Negative visible bowel gas. IMPRESSION: Low lung volumes, otherwise no acute cardiopulmonary abnormality. Electronically Signed   By: Odessa Fleming M.D.   On: 01/19/2023 12:09   DG Abd 2 Views Result Date: 01/03/2023 CLINICAL DATA:  Constipation EXAM: ABDOMEN - 2 VIEW COMPARISON:  11/22/2022 FINDINGS: Two supine frontal views of the abdomen and pelvis are obtained. No bowel obstruction or ileus. Minimal stool throughout the colon. No masses or abnormal  calcifications. Lung bases are clear. IMPRESSION: 1. No bowel obstruction or ileus. Minimal retained stool within the colon. Electronically Signed   By: Sharlet Salina M.D.   On: 01/03/2023 12:35   NM GASTRIC EMPTYING Result Date: 12/27/2022 CLINICAL DATA:  Gastroparesis EXAM: NUCLEAR MEDICINE GASTRIC EMPTYING SCAN TECHNIQUE: After oral ingestion of radiolabeled meal, sequential abdominal images were obtained for 240 minutes. Residual percentage of activity remaining within the stomach was calculated at 60, 120 and 240 minutes. RADIOPHARMACEUTICALS:  1.92 mCi Tc-37m cephalocaudal Lloyd in egg beaters water COMPARISON:  None Available. FINDINGS: Expected location of the stomach in the left upper quadrant. Ingested meal empties the stomach gradually over the course of the study with 67% retention at 60 min and 52% retention at 120 min and 24% retention at 240 minutes (normal retention less than 30% at a 120 min). IMPRESSION: Delayed gastric emptying. Electronically Signed   By: Karen Kays M.D.   On: 12/27/2022 14:08   [place] EKG SITE RITE Result Date: 12/26/2022 If Site Rite image not  attached, placement could not be confirmed due to current cardiac rhythm.  DG Chest Port 1 View Result Date: 12/25/2022 CLINICAL DATA:  Chest pain, vomiting, lethargic EXAM: PORTABLE CHEST 1 VIEW COMPARISON:  11/21/2022 FINDINGS: Single frontal view of the chest demonstrates postsurgical changes from median sternotomy. The cardiac silhouette is stable. No acute airspace disease, effusion, or pneumothorax. No acute bony abnormalities. IMPRESSION: 1. No acute intrathoracic process. Electronically Signed   By: Sharlet Salina M.D.   On: 12/25/2022 21:34    Microbiology: Recent Results (from the past 240 hours)  Resp panel by RT-PCR (RSV, Flu A&B, Covid) Anterior Nasal Swab     Status: None   Collection Time: 01/19/23  2:20 PM   Specimen: Anterior Nasal Swab  Result Value Ref Range Status   SARS Coronavirus 2 by RT PCR  NEGATIVE NEGATIVE Final    Comment: (NOTE) SARS-CoV-2 target nucleic acids are NOT DETECTED.  The SARS-CoV-2 RNA is generally detectable in upper respiratory specimens during the acute phase of infection. The lowest concentration of SARS-CoV-2 viral copies this assay can detect is 138 copies/mL. A negative result does not preclude SARS-Cov-2 infection and should not be used as the sole basis for treatment or other patient management decisions. A negative result may occur with  improper specimen collection/handling, submission of specimen other than nasopharyngeal swab, presence of viral mutation(s) within the areas targeted by this assay, and inadequate number of viral copies(<138 copies/mL). A negative result must be combined with clinical observations, patient history, and epidemiological information. The expected result is Negative.  Fact Sheet for Patients:  BloggerCourse.com  Fact Sheet for Healthcare Providers:  SeriousBroker.it  This test is no t yet approved or cleared by the [place] FDA and  has been authorized for detection and/or diagnosis of SARS-CoV-2 by FDA under an Emergency Use Authorization (EUA). This EUA will remain  in effect (meaning this test can be used) for the duration of the COVID-19 declaration under Section 564(b)(1) of the Act, 21 U.S.C.section 360bbb-3(b)(1), unless the authorization is terminated  or revoked sooner.       Influenza A by PCR NEGATIVE NEGATIVE Final   Influenza B by PCR NEGATIVE NEGATIVE Final    Comment: (NOTE) The Xpert Xpress SARS-CoV-2/FLU/RSV plus assay is intended as an aid in the diagnosis of influenza from Nasopharyngeal swab specimens and should not be used as a sole basis for treatment. Nasal washings and aspirates are unacceptable for Xpert Xpress SARS-CoV-2/FLU/RSV testing.  Fact Sheet for Patients: BloggerCourse.com  Fact Sheet for  Healthcare Providers: SeriousBroker.it  This test is not yet approved or cleared by the [place] FDA and has been authorized for detection and/or diagnosis of SARS-CoV-2 by FDA under an Emergency Use Authorization (EUA). This EUA will remain in effect (meaning this test can be used) for the duration of the COVID-19 declaration under Section 564(b)(1) of the Act, 21 U.S.C. section 360bbb-3(b)(1), unless the authorization is terminated or revoked.     Resp Syncytial Virus by PCR NEGATIVE NEGATIVE Final    Comment: (NOTE) Fact Sheet for Patients: BloggerCourse.com  Fact Sheet for Healthcare Providers: SeriousBroker.it  This test is not yet approved or cleared by the [place] FDA and has been authorized for detection and/or diagnosis of SARS-CoV-2 by FDA under an Emergency Use Authorization (EUA). This EUA will remain in effect (meaning this test can be used) for the duration of the COVID-19 declaration under Section 564(b)(1) of the Act, 21 U.S.C. section 360bbb-3(b)(1), unless the authorization is terminated or  revoked.  Performed at [facility], [address] [place], [place] [address]   MRSA Next Gen by PCR, Nasal     Status: None   Collection Time: 01/19/23  4:17 PM   Specimen: Nasal Mucosa; Nasal Swab  Result Value Ref Range Status   MRSA by PCR Next Gen NOT DETECTED NOT DETECTED Final    Comment: (NOTE) The GeneXpert MRSA Assay (FDA approved for NASAL specimens only), is one component of a comprehensive MRSA colonization surveillance program. It is not intended to diagnose MRSA infection nor to guide or monitor treatment for MRSA infections. Test performance is not FDA approved in patients less than 35 years old. Performed at [facility], [address], [place], [place] [address]      Labs: Basic Metabolic Panel: Recent Labs  Lab  01/19/23 2205 01/20/23 0351 01/21/23 0534 01/22/23 1824 01/22/23 1831 01/23/23 0634  NA 134* 133* 134* 134* 134* 135  K 4.2 4.2 4.6 4.6 4.4 3.9  CL 102 101 103 103  --  105  CO2 25 25 22  20*  --  21*  GLUCOSE 178* 137* 113* 265*  --  205*  BUN 30* 25* 19 18  --  16  CREATININE 1.16* 0.97 0.97 0.98  --  1.04*  CALCIUM 9.2 9.0 9.0 9.3  --  8.8*   Liver Function Tests: Recent Labs  Lab 01/19/23 1230 01/20/23 0351 01/22/23 1824 01/23/23 0634  AST 11* 16 15 13*  ALT 13 11 13 12   ALKPHOS 78 61 69 60  BILITOT 0.5 0.3 0.6 0.4  PROT 7.6 6.7 7.0 6.5  ALBUMIN 3.8 3.2* 3.4* 3.1*   No results for input(s): "LIPASE", "AMYLASE" in the last 168 hours. No results for input(s): "AMMONIA" in the last 168 hours. CBC: Recent Labs  Lab 01/19/23 1230 01/20/23 0351 01/22/23 1824 01/22/23 1831 01/23/23 0634  WBC 8.6 10.2 11.2*  --  9.2  NEUTROABS  --   --  8.2*  --   --   HGB 10.8* 10.1* 10.5* 11.2* 10.1*  HCT 33.3* 30.5* 32.5* 33.0* 31.2*  MCV 75.9* 74.2* 75.6*  --  75.4*  PLT 455* 440* 413*  --  386   Cardiac Enzymes: No results for input(s): "CKTOTAL", "CKMB", "CKMBINDEX", "TROPONINI" in the last 168 hours. BNP: BNP (last 3 results) Recent Labs    04/30/22 1245 05/22/22 0229 09/06/22 2242  BNP 26.3 49.6 19.5    ProBNP (last 3 results) No results for input(s): "PROBNP" in the last 8760 hours.  CBG: Recent Labs  Lab 01/21/23 0722 01/21/23 1130 01/22/23 1547 01/22/23 2213 01/23/23 0821  GLUCAP 127* 198* 267* 204* 210*       Signed:  Rhetta Mura MD   [facility] 01/23/2023, 10:45 AM

## 2023-01-30 ENCOUNTER — Emergency Department (HOSPITAL_COMMUNITY): Payer: 59

## 2023-01-30 ENCOUNTER — Other Ambulatory Visit: Payer: Self-pay

## 2023-01-30 ENCOUNTER — Encounter (HOSPITAL_COMMUNITY): Payer: Self-pay

## 2023-01-30 ENCOUNTER — Other Ambulatory Visit: Payer: Self-pay | Admitting: Family

## 2023-01-30 ENCOUNTER — Observation Stay (HOSPITAL_COMMUNITY)
Admission: EM | Admit: 2023-01-30 | Discharge: 2023-01-31 | Disposition: A | Discharge status: Home / Self Care | Payer: 59 | Attending: Student | Admitting: Student

## 2023-01-30 DIAGNOSIS — Z79899 Other long term (current) drug therapy: Secondary | ICD-10-CM | POA: Diagnosis not present

## 2023-01-30 DIAGNOSIS — R739 Hyperglycemia, unspecified: Secondary | ICD-10-CM | POA: Diagnosis not present

## 2023-01-30 DIAGNOSIS — E119 Type 2 diabetes mellitus without complications: Principal | ICD-10-CM

## 2023-01-30 DIAGNOSIS — R000 Tachycardia, unspecified: Secondary | ICD-10-CM | POA: Diagnosis not present

## 2023-01-30 DIAGNOSIS — Z7901 Long term (current) use of anticoagulants: Secondary | ICD-10-CM | POA: Diagnosis not present

## 2023-01-30 DIAGNOSIS — I110 Hypertensive heart disease with heart failure: Secondary | ICD-10-CM | POA: Diagnosis not present

## 2023-01-30 DIAGNOSIS — Z8679 Personal history of other diseases of the circulatory system: Secondary | ICD-10-CM

## 2023-01-30 DIAGNOSIS — E1169 Type 2 diabetes mellitus with other specified complication: Secondary | ICD-10-CM

## 2023-01-30 DIAGNOSIS — G9340 Encephalopathy, unspecified: Principal | ICD-10-CM | POA: Diagnosis present

## 2023-01-30 DIAGNOSIS — F411 Generalized anxiety disorder: Secondary | ICD-10-CM | POA: Diagnosis present

## 2023-01-30 DIAGNOSIS — G8194 Hemiplegia, unspecified affecting left nondominant side: Secondary | ICD-10-CM | POA: Diagnosis not present

## 2023-01-30 DIAGNOSIS — Z794 Long term (current) use of insulin: Secondary | ICD-10-CM | POA: Diagnosis not present

## 2023-01-30 DIAGNOSIS — Z7902 Long term (current) use of antithrombotics/antiplatelets: Secondary | ICD-10-CM | POA: Diagnosis not present

## 2023-01-30 DIAGNOSIS — R4182 Altered mental status, unspecified: Secondary | ICD-10-CM | POA: Diagnosis present

## 2023-01-30 DIAGNOSIS — I6523 Occlusion and stenosis of bilateral carotid arteries: Secondary | ICD-10-CM | POA: Insufficient documentation

## 2023-01-30 DIAGNOSIS — I5032 Chronic diastolic (congestive) heart failure: Secondary | ICD-10-CM | POA: Diagnosis present

## 2023-01-30 DIAGNOSIS — I25118 Atherosclerotic heart disease of native coronary artery with other forms of angina pectoris: Secondary | ICD-10-CM | POA: Insufficient documentation

## 2023-01-30 DIAGNOSIS — Z951 Presence of aortocoronary bypass graft: Secondary | ICD-10-CM | POA: Insufficient documentation

## 2023-01-30 DIAGNOSIS — G4733 Obstructive sleep apnea (adult) (pediatric): Secondary | ICD-10-CM

## 2023-01-30 DIAGNOSIS — I10 Essential (primary) hypertension: Secondary | ICD-10-CM | POA: Diagnosis present

## 2023-01-30 DIAGNOSIS — I6782 Cerebral ischemia: Secondary | ICD-10-CM | POA: Diagnosis not present

## 2023-01-30 DIAGNOSIS — E1165 Type 2 diabetes mellitus with hyperglycemia: Secondary | ICD-10-CM | POA: Diagnosis not present

## 2023-01-30 DIAGNOSIS — R29818 Other symptoms and signs involving the nervous system: Secondary | ICD-10-CM | POA: Diagnosis not present

## 2023-01-30 DIAGNOSIS — R29810 Facial weakness: Secondary | ICD-10-CM | POA: Insufficient documentation

## 2023-01-30 DIAGNOSIS — E0590 Thyrotoxicosis, unspecified without thyrotoxic crisis or storm: Secondary | ICD-10-CM | POA: Diagnosis present

## 2023-01-30 DIAGNOSIS — G9389 Other specified disorders of brain: Secondary | ICD-10-CM | POA: Diagnosis not present

## 2023-01-30 HISTORY — DX: Reversible cerebrovascular vasoconstriction syndrome: I67.841

## 2023-01-30 LAB — COMPREHENSIVE METABOLIC PANEL
ALT: 15 U/L (ref 0–44)
AST: 13 U/L — ABNORMAL LOW (ref 15–41)
Albumin: 3.3 g/dL — ABNORMAL LOW (ref 3.5–5.0)
Alkaline Phosphatase: 72 U/L (ref 38–126)
Anion gap: 12 (ref 5–15)
BUN: 29 mg/dL — ABNORMAL HIGH (ref 6–20)
CO2: 19 mmol/L — ABNORMAL LOW (ref 22–32)
Calcium: 8.7 mg/dL — ABNORMAL LOW (ref 8.9–10.3)
Chloride: 101 mmol/L (ref 98–111)
Creatinine, Ser: 1.31 mg/dL — ABNORMAL HIGH (ref 0.44–1.00)
GFR, Estimated: 50 mL/min — ABNORMAL LOW (ref >60)
Glucose, Bld: 283 mg/dL — ABNORMAL HIGH (ref 70–99)
Potassium: 4 mmol/L (ref 3.5–5.1)
Sodium: 132 mmol/L — ABNORMAL LOW (ref 135–145)
Total Bilirubin: 0.4 mg/dL (ref <1.2)
Total Protein: 6.8 g/dL (ref 6.5–8.1)

## 2023-01-30 LAB — CBC
HCT: 32.3 % — ABNORMAL LOW (ref 36.0–46.0)
Hemoglobin: 10.8 g/dL — ABNORMAL LOW (ref 12.0–15.0)
MCH: 25.1 pg — ABNORMAL LOW (ref 26.0–34.0)
MCHC: 33.4 g/dL (ref 30.0–36.0)
MCV: 74.9 fL — ABNORMAL LOW (ref 80.0–100.0)
Platelets: 442 10*3/uL — ABNORMAL HIGH (ref 150–400)
RBC: 4.31 MIL/uL (ref 3.87–5.11)
RDW: 16.8 % — ABNORMAL HIGH (ref 11.5–15.5)
WBC: 9 10*3/uL (ref 4.0–10.5)
nRBC: 0 % (ref 0.0–0.2)

## 2023-01-30 LAB — BLOOD GAS, VENOUS
Acid-base deficit: 1.8 mmol/L (ref 0.0–2.0)
Bicarbonate: 23.1 mmol/L (ref 20.0–28.0)
O2 Saturation: 84.2 %
Patient temperature: 37
pCO2, Ven: 39 mm[Hg] — ABNORMAL LOW (ref 44–60)
pH, Ven: 7.38 (ref 7.25–7.43)
pO2, Ven: 53 mm[Hg] — ABNORMAL HIGH (ref 32–45)

## 2023-01-30 LAB — TROPONIN I (HIGH SENSITIVITY)
Troponin I (High Sensitivity): 3 ng/L (ref <18)
Troponin I (High Sensitivity): 3 ng/L (ref <18)

## 2023-01-30 LAB — LIPASE, BLOOD: Lipase: 97 U/L — ABNORMAL HIGH (ref 11–51)

## 2023-01-30 LAB — CBG MONITORING, ED: Glucose-Capillary: 266 mg/dL — ABNORMAL HIGH (ref 70–99)

## 2023-01-30 LAB — URINALYSIS, ROUTINE W REFLEX MICROSCOPIC
Bilirubin Urine: NEGATIVE
Glucose, UA: 50 mg/dL — AB
Hgb urine dipstick: NEGATIVE
Ketones, ur: NEGATIVE mg/dL
Leukocytes,Ua: NEGATIVE
Nitrite: NEGATIVE
Protein, ur: NEGATIVE mg/dL
Specific Gravity, Urine: 1.013 (ref 1.005–1.030)
pH: 5 (ref 5.0–8.0)

## 2023-01-30 LAB — MAGNESIUM: Magnesium: 2.1 mg/dL (ref 1.7–2.4)

## 2023-01-30 LAB — HCG, QUANTITATIVE, PREGNANCY: hCG, Beta Chain, Quant, S: 5 m[IU]/mL — ABNORMAL HIGH (ref <5)

## 2023-01-30 MED ORDER — ONDANSETRON HCL 4 MG/2ML IJ SOLN
4.0000 mg | Freq: Once | INTRAMUSCULAR | Status: AC
  Administered 2023-01-30: 4 mg via INTRAVENOUS
  Filled 2023-01-30: qty 2

## 2023-01-30 MED ORDER — ALBUTEROL SULFATE HFA 108 (90 BASE) MCG/ACT IN AERS
2.0000 | INHALATION_SPRAY | Freq: Once | RESPIRATORY_TRACT | Status: AC
  Administered 2023-01-30: 2 via RESPIRATORY_TRACT
  Filled 2023-01-30: qty 6.7

## 2023-01-30 MED ORDER — SODIUM CHLORIDE 0.9 % IV BOLUS
500.0000 mL | Freq: Once | INTRAVENOUS | Status: AC
  Administered 2023-01-30: 500 mL via INTRAVENOUS

## 2023-01-30 MED ORDER — LORAZEPAM 2 MG/ML IJ SOLN
0.5000 mg | Freq: Once | INTRAMUSCULAR | Status: AC
  Administered 2023-01-30: 0.5 mg via INTRAVENOUS
  Filled 2023-01-30: qty 1

## 2023-01-30 MED ORDER — SODIUM CHLORIDE 0.9 % IV BOLUS
1000.0000 mL | Freq: Once | INTRAVENOUS | Status: AC
  Administered 2023-01-30: 1000 mL via INTRAVENOUS

## 2023-01-30 NOTE — ED Notes (Signed)
Patient HR 140-150's EDP informed. No new orders.

## 2023-01-30 NOTE — ED Notes (Signed)
Patient transported to MRI 

## 2023-01-30 NOTE — ED Provider Notes (Signed)
[place] EMERGENCY DEPARTMENT AT [facility] Provider Note   CSN: [id number] Arrival date & time: 01/30/23  1114     History  Chief Complaint  Patient presents with   Altered Mental Status    Lori [name] is a 52 y.o. female.  HPI   52 year old female with previous history of CVA and residual left-sided hemiplegia, right-sided paraplegia, speech change presents emergency department with reported acute change in mental status.  Patient is accompanied by spouse.  He states that she is wheelchair-bound, was in her baseline health up until midnight last night.  He describes her baseline health as interactive, conversational.  Now he states that she is essentially nonverbal.  Patient has garbled speech but at times is able to tell me that she has a headache and that she has had previous strokes.  Spouse does not believe that she had any visual deficits at baseline from previous history.  Otherwise no mention of acute fever, vomiting, diarrhea.  Home Medications Prior to Admission medications   Medication Sig Start Date End Date Taking? Authorizing Provider  albuterol (PROVENTIL) (2.5 MG/3ML) 0.083% nebulizer solution Take 3 mLs (2.5 mg total) by nebulization every 6 (six) hours as needed for wheezing or shortness of breath. 01/01/23   Ngetich, Dinah C, NP  albuterol (VENTOLIN HFA) 108 (90 Base) MCG/ACT inhaler INHALE 2 PUFFS BY MOUTH EVERY 6 HOURS AS NEEDED FOR WHEEZING AND FOR SHORTNESS OF BREATH Strength: 108 (90 Base) MCG/ACT Patient taking differently: Inhale 2 puffs into the lungs every 6 (six) hours as needed for shortness of breath. 01/01/23   Ngetich, Dinah C, NP  amLODipine (NORVASC) 5 MG tablet Take 10 mg by mouth in the morning.    [provider]  baclofen (LIORESAL) 10 MG tablet Take 1 tablet (10 mg total) by mouth 3 (three) times daily. 01/08/23   Ngetich, Dinah C, NP  bisacodyl (DULCOLAX) 5 MG EC tablet Take 1 tablet (5 mg total) by  mouth daily as needed for moderate constipation. 12/06/22   Jerald Kief, MD  clopidogrel (PLAVIX) 75 MG tablet Take 1 tablet (75 mg total) by mouth daily. 01/01/23   Ngetich, Donalee Citrin, NP  Continuous Glucose Receiver (FREESTYLE LIBRE 2 READER) DEVI 1 Device by Does not apply route daily. E11.69 11/01/22   Ngetich, Dinah C, NP  Continuous Glucose Sensor (FREESTYLE LIBRE 14 DAY SENSOR) MISC 1 each by Does not apply route as needed. DX: E11.69 12/24/22   Ngetich, Dinah C, NP  cyclobenzaprine (FLEXERIL) 5 MG tablet TAKE 1 TABLET BY MOUTH 3 TIMES DAILY AS NEEDED FOR MUSCLE SPASMS 11/18/22   Lovorn, Aundra Millet, MD  diclofenac Sodium (VOLTAREN) 1 % GEL Apply 2 g topically 4 (four) times daily.    [provider]  escitalopram (LEXAPRO) 20 MG tablet Take 1 tablet (20 mg total) by mouth every morning. 01/01/23   Ngetich, Dinah C, NP  ezetimibe (ZETIA) 10 MG tablet Take 1 tablet (10 mg total) by mouth daily. 01/01/23   Ngetich, Dinah C, NP  gabapentin (NEURONTIN) 100 MG capsule Take 100 mg by mouth 3 (three) times daily. 11/14/22   [provider]  insulin glargine (LANTUS SOLOSTAR) 100 UNIT/ML Solostar Pen Inject 20 Units into the skin at bedtime. Patient taking differently: Inject 22 Units into the skin at bedtime. 01/09/23   Ngetich, Dinah C, NP  insulin lispro (HUMALOG) 100 UNIT/ML injection Sliding scale AC, HS CBG 121 - 150: 2 units  CBG 151 - 200: 3 units  CBG 201 - 250: 5 units  CBG 251 - 300: 8 units  CBG 301 - 350: 11 units  CBG 351 - 400: 15 units  CBG > 400: call MD 12/06/22   Jerald Kief, MD  insulin lispro (HUMALOG) 100 UNIT/ML injection Inject 0.04 mLs (4 Units total) into the skin 3 (three) times daily with meals. Patient not taking: Reported on 01/19/2023 01/01/23   Ngetich, Dinah C, NP  LORazepam (ATIVAN) 0.5 MG tablet Take 1 tablet (0.5 mg total) by mouth at bedtime. 12/06/22   Jerald Kief, MD  methimazole (TAPAZOLE) 5 MG tablet Take 1 tablet (5 mg total) by mouth  every morning. 01/01/23   Ngetich, Dinah C, NP  metoCLOPramide (REGLAN) 5 MG tablet Take 1 tablet (5 mg total) by mouth 3 (three) times daily before meals. 01/04/23   Danford, Earl Lites, MD  metoprolol tartrate (LOPRESSOR) 100 MG tablet Take 1 tablet (100 mg total) by mouth 2 (two) times daily. 01/08/23   Ngetich, Dinah C, NP  omeprazole (PRILOSEC) 20 MG capsule TAKE 1 CAPSULE(20 MG) BY MOUTH DAILY 01/20/23   Ngetich, Dinah C, NP  polyethylene glycol powder (GLYCOLAX/MIRALAX) 17 GM/SCOOP powder Take 17 g by mouth daily. Hold for loose stool 01/01/23   Ngetich, Dinah C, NP  ranolazine (RANEXA) 500 MG 12 hr tablet Take 1 tablet (500 mg total) by mouth 2 (two) times daily. 10/09/22 01/23/24  [place], Alvira Philips, DO  rivaroxaban (XARELTO) 10 MG TABS tablet Take 1 tablet (10 mg total) by mouth daily. 01/01/23   Ngetich, Dinah C, NP  tamsulosin (FLOMAX) 0.4 MG CAPS capsule Take 0.4 mg by mouth at bedtime. 11/04/22   [provider]  topiramate (TOPAMAX) 25 MG tablet Take 1 tablet (25 mg total) by mouth 2 (two) times daily. 07/10/22   Sherryll Burger, Pratik D, DO  zinc oxide (BALMEX) 11.3 % CREA cream Apply 1 Application topically 2 (two) times daily.    [provider]      Allergies    Asa [aspirin], Contrast media [iodinated contrast media], Imitrex [sumatriptan], Ms contin [morphine], Penicillins, Firvanq [vancomycin], Cipro [ciprofloxacin hcl], Nitrofuran derivatives, and Wound dressing adhesive    Review of Systems   Review of Systems  Unable to perform ROS: Mental status change    Physical Exam Updated Vital Signs BP (!) 102/90   Pulse (!) 113   Temp 98 F (36.7 C) (Oral)   Resp 10   SpO2 98%  Physical Exam Vitals and nursing note reviewed.  HENT:     Mouth/Throat:     Mouth: Mucous membranes are dry.  Eyes:     Pupils: Pupils are equal, round, and reactive to light.     Comments: She is able to look left but does not appear to be able to cross her eyes to the right from midline,  is mainly responding to visual threat  Cardiovascular:     Rate and Rhythm: Tachycardia present.  Pulmonary:     Effort: Pulmonary effort is normal. No respiratory distress.  Abdominal:     Palpations: Abdomen is soft.     Tenderness: There is no abdominal tenderness.  Skin:    General: Skin is warm.  Neurological:     Comments: Oriented to her name and place. Appears to have a facial droop which spouse states is baseline but may be more pronounced.  She mostly has garbled speech but at times does have intelligible answers.  She is hemiplegic on the left with some movement  of her right upper extremity.  Unclear if there is weakness from baseline in the arm.  Psychiatric:        Mood and Affect: Mood normal.     ED Results / Procedures / Treatments   Labs (all labs ordered are listed, but only abnormal results are displayed) Labs Reviewed  CBG MONITORING, ED - Abnormal; Notable for the following components:      Result Value   Glucose-Capillary 266 (*)    All other components within normal limits  COMPREHENSIVE METABOLIC PANEL  CBC  URINALYSIS, ROUTINE W REFLEX MICROSCOPIC  BLOOD GAS, VENOUS  HCG, QUANTITATIVE, PREGNANCY    EKG None  Radiology No results found.  Procedures Procedures    Medications Ordered in ED Medications - No data to display  ED Course/ Medical Decision Making/ A&P                                 Medical Decision Making Amount and/or Complexity of Data Reviewed Labs: ordered. Radiology: ordered.  Risk Prescription drug management.   52 year old female with previous CVA and residual deficits presents to the emergency department accompanied by spouse for concern of altered mental status.  Last known normal was around midnight.  He describes her normal function is wheelchair-bound, conversational and interactive.  No other acute illness reported.  Patient is tachycardic on arrival, complaining of a headache, oriented to self.  She displays  the hemiaplasia and residual deficits that are noted from previous strokes.  However face does seem slightly more drooped and her eyes have not crossed midline over to the right even with encouragement.  Concern for LVO findings within a 24-hour period.  Consulted with on-call neurologist, Dr. Otelia Limes.  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  She has had multiple admissions for encephalopathy, sometimes related to hyperglycemia.  This is a very unclear picture however she does not seem to be a candidate for any acute treatment.  Will plan for emergent head CT to rule out obvious bleed and then we will pursue MRI/MRA as she has an anaphylaxis iodine allergy.  Will also evaluate from a metabolic standpoint.  CT of the head shows no acute finding.  Patient is a difficult IV stick, failed attempt with ultrasound by myself.  IV team here for further evaluation.  Also of note the patient called out complaining of abdominal pain, when the head of the stretcher was sat up she had an episode of emesis.  She became tacky into the 130s, repeat EKG continued to show sinus tachycardia.  After the emesis patient feels improved.  Her mentation is actually improved now as well.  Eyes cross midline, she continues to have difficult understand speech but she is fluent, oriented.  Will plan to evaluate with lab, MRI/MRA.  Pending these results she will most likely benefit from an admission regardless with another episode of change in mental status.        Final Clinical Impression(s) / ED Diagnoses Final diagnoses:  None    Rx / DC Orders ED Discharge Orders     None         Rozelle Logan, DO 01/30/23 1531

## 2023-01-30 NOTE — ED Provider Notes (Signed)
3:23 PM Assumed care of patient from off-going team. For more details, please see note from same day.  In brief, this is a 52 y.o. female with recurrent AMS brought in by spouse. CTH showed no bleed. D/w neurology who recommended MRA and admission to [facility]. Sinus tachycardia. Had an episode of CP and vomiting here as well.   Plan/Dispo at time of sign-out & ED Course since sign-out: [ ]  labs, MRA  BP 127/87   Pulse (!) 136   Temp 98 F (36.7 C) (Oral)   Resp 14   SpO2 100%    ED Course:   Clinical Course as of 01/31/23 0036  Thu Jan 30, 2023  1915 D/w Dr. Wilford Corner with neurology and it appears her MRI had not been ordered. Will order MRI now. If MRI positive, neurology will consult. MRA has already been completed, awaiting read. [HN]  2247 MR ANGIO HEAD WO CONTRAST 1. No acute intracranial process. No evidence of acute or subacute infarct. 2. Evaluation of the vasculature is somewhat limited by motion. 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 This could be further evaluated with a CTA of the head if clinically indicated. 3. Mild stenosis in the right M1. 4. Poor signal in the vertebral arteries, which may be artifactual or reflect stenosis or occlusion. Intermittent irregular signal in the basilar artery, likely multifocal stenosis. 5. Irregularity of the anterior and posterior circulation, which may be due to atherosclerosis and/or motion.   [HN]  2330 Patient significantly tachycardic into 140s. Ordered 1L IVF.  Consulted to hospitalist for admission who is coming to see the patient. [HN]    Clinical Course User Index [HN] Loetta Rough, MD    Dispo:  ------------------------------- Vivi Barrack, MD Emergency Medicine  This note was created using dictation software, which may contain spelling or grammatical errors.

## 2023-01-30 NOTE — ED Notes (Signed)
Pt heard crying out- holding her chest and saying she has chest pain. HR 130s. When pt was sat up, she vomited a moderate amount of brown emesis. Repeat EKG done, Dr. Wilkie Aye at bedside.

## 2023-01-30 NOTE — ED Notes (Signed)
Unsuccessful IV attempt x2.  

## 2023-01-30 NOTE — ED Triage Notes (Signed)
BIBA from home for AMS since this AM. Per spouse, pt is usually verbal, A&O. Hx of CVA with left side deficit. 97% 112 HR 302 cbg

## 2023-01-31 ENCOUNTER — Emergency Department (HOSPITAL_COMMUNITY): Payer: 59

## 2023-01-31 ENCOUNTER — Observation Stay (HOSPITAL_COMMUNITY)
Admit: 2023-01-31 | Discharge: 2023-01-31 | Disposition: A | Discharge status: Home / Self Care | Payer: 59 | Attending: Internal Medicine

## 2023-01-31 ENCOUNTER — Observation Stay (HOSPITAL_BASED_OUTPATIENT_CLINIC_OR_DEPARTMENT_OTHER): Payer: 59

## 2023-01-31 ENCOUNTER — Encounter (HOSPITAL_COMMUNITY): Payer: Self-pay | Admitting: Internal Medicine

## 2023-01-31 DIAGNOSIS — I10 Essential (primary) hypertension: Secondary | ICD-10-CM | POA: Diagnosis not present

## 2023-01-31 DIAGNOSIS — G9340 Encephalopathy, unspecified: Secondary | ICD-10-CM | POA: Diagnosis not present

## 2023-01-31 DIAGNOSIS — G8194 Hemiplegia, unspecified affecting left nondominant side: Secondary | ICD-10-CM

## 2023-01-31 DIAGNOSIS — R569 Unspecified convulsions: Secondary | ICD-10-CM | POA: Diagnosis not present

## 2023-01-31 DIAGNOSIS — F411 Generalized anxiety disorder: Secondary | ICD-10-CM

## 2023-01-31 DIAGNOSIS — G4733 Obstructive sleep apnea (adult) (pediatric): Secondary | ICD-10-CM

## 2023-01-31 DIAGNOSIS — Z7401 Bed confinement status: Secondary | ICD-10-CM | POA: Diagnosis not present

## 2023-01-31 DIAGNOSIS — I5032 Chronic diastolic (congestive) heart failure: Secondary | ICD-10-CM

## 2023-01-31 DIAGNOSIS — G459 Transient cerebral ischemic attack, unspecified: Secondary | ICD-10-CM | POA: Diagnosis not present

## 2023-01-31 DIAGNOSIS — R079 Chest pain, unspecified: Secondary | ICD-10-CM | POA: Diagnosis not present

## 2023-01-31 DIAGNOSIS — E0590 Thyrotoxicosis, unspecified without thyrotoxic crisis or storm: Secondary | ICD-10-CM

## 2023-01-31 DIAGNOSIS — Z743 Need for continuous supervision: Secondary | ICD-10-CM | POA: Diagnosis not present

## 2023-01-31 DIAGNOSIS — E119 Type 2 diabetes mellitus without complications: Secondary | ICD-10-CM

## 2023-01-31 DIAGNOSIS — I639 Cerebral infarction, unspecified: Secondary | ICD-10-CM

## 2023-01-31 DIAGNOSIS — R000 Tachycardia, unspecified: Secondary | ICD-10-CM | POA: Diagnosis not present

## 2023-01-31 DIAGNOSIS — R4182 Altered mental status, unspecified: Secondary | ICD-10-CM

## 2023-01-31 DIAGNOSIS — Z8679 Personal history of other diseases of the circulatory system: Secondary | ICD-10-CM | POA: Diagnosis not present

## 2023-01-31 DIAGNOSIS — Z7901 Long term (current) use of anticoagulants: Secondary | ICD-10-CM | POA: Diagnosis not present

## 2023-01-31 DIAGNOSIS — Z794 Long term (current) use of insulin: Secondary | ICD-10-CM

## 2023-01-31 DIAGNOSIS — R531 Weakness: Secondary | ICD-10-CM | POA: Diagnosis not present

## 2023-01-31 LAB — CBC
HCT: 29.4 % — ABNORMAL LOW (ref 36.0–46.0)
Hemoglobin: 9.5 g/dL — ABNORMAL LOW (ref 12.0–15.0)
MCH: 24.5 pg — ABNORMAL LOW (ref 26.0–34.0)
MCHC: 32.3 g/dL (ref 30.0–36.0)
MCV: 75.8 fL — ABNORMAL LOW (ref 80.0–100.0)
Platelets: 420 10*3/uL — ABNORMAL HIGH (ref 150–400)
RBC: 3.88 MIL/uL (ref 3.87–5.11)
RDW: 16.9 % — ABNORMAL HIGH (ref 11.5–15.5)
WBC: 11.1 10*3/uL — ABNORMAL HIGH (ref 4.0–10.5)
nRBC: 0 % (ref 0.0–0.2)

## 2023-01-31 LAB — AMMONIA: Ammonia: 13 umol/L (ref 9–35)

## 2023-01-31 LAB — BASIC METABOLIC PANEL
Anion gap: 9 (ref 5–15)
BUN: 21 mg/dL — ABNORMAL HIGH (ref 6–20)
CO2: 22 mmol/L (ref 22–32)
Calcium: 8.6 mg/dL — ABNORMAL LOW (ref 8.9–10.3)
Chloride: 101 mmol/L (ref 98–111)
Creatinine, Ser: 1.05 mg/dL — ABNORMAL HIGH (ref 0.44–1.00)
GFR, Estimated: >60 mL/min (ref >60)
Glucose, Bld: 187 mg/dL — ABNORMAL HIGH (ref 70–99)
Potassium: 3.8 mmol/L (ref 3.5–5.1)
Sodium: 132 mmol/L — ABNORMAL LOW (ref 135–145)

## 2023-01-31 LAB — CBG MONITORING, ED
Glucose-Capillary: 130 mg/dL — ABNORMAL HIGH (ref 70–99)
Glucose-Capillary: 135 mg/dL — ABNORMAL HIGH (ref 70–99)
Glucose-Capillary: 210 mg/dL — ABNORMAL HIGH (ref 70–99)
Glucose-Capillary: 270 mg/dL — ABNORMAL HIGH (ref 70–99)

## 2023-01-31 MED ORDER — RANOLAZINE ER 500 MG PO TB12
500.0000 mg | ORAL_TABLET | Freq: Two times a day (BID) | ORAL | Status: DC
  Administered 2023-01-31 (×2): 500 mg via ORAL
  Filled 2023-01-31 (×2): qty 1

## 2023-01-31 MED ORDER — ACETAMINOPHEN 650 MG RE SUPP: 650.0000 mg | Freq: Four times a day (QID) | RECTAL | Status: DC | PRN

## 2023-01-31 MED ORDER — METOCLOPRAMIDE HCL 10 MG PO TABS
5.0000 mg | ORAL_TABLET | Freq: Three times a day (TID) | ORAL | Status: DC
  Administered 2023-01-31 (×2): 5 mg via ORAL
  Filled 2023-01-31 (×2): qty 1

## 2023-01-31 MED ORDER — GABAPENTIN 100 MG PO CAPS
100.0000 mg | ORAL_CAPSULE | Freq: Three times a day (TID) | ORAL | Status: DC
Start: 2023-01-31 — End: 2023-01-31
  Administered 2023-01-31: 100 mg via ORAL
  Filled 2023-01-31: qty 1

## 2023-01-31 MED ORDER — TAMSULOSIN HCL 0.4 MG PO CAPS
0.4000 mg | ORAL_CAPSULE | Freq: Every day | ORAL | Status: DC
  Administered 2023-01-31: 0.4 mg via ORAL
  Filled 2023-01-31: qty 1

## 2023-01-31 MED ORDER — POLYETHYLENE GLYCOL 3350 17 G PO PACK
17.0000 g | PACK | Freq: Every day | ORAL | Status: DC
  Administered 2023-01-31: 17 g via ORAL
  Filled 2023-01-31: qty 1

## 2023-01-31 MED ORDER — TOPIRAMATE 25 MG PO TABS
25.0000 mg | ORAL_TABLET | Freq: Two times a day (BID) | ORAL | Status: DC
  Administered 2023-01-31 (×2): 25 mg via ORAL
  Filled 2023-01-31 (×2): qty 1

## 2023-01-31 MED ORDER — BACLOFEN 10 MG PO TABS
10.0000 mg | ORAL_TABLET | Freq: Three times a day (TID) | ORAL | Status: DC
  Administered 2023-01-31: 10 mg via ORAL
  Filled 2023-01-31: qty 1

## 2023-01-31 MED ORDER — PANTOPRAZOLE SODIUM 40 MG PO TBEC: 40.0000 mg | DELAYED_RELEASE_TABLET | Freq: Every day | ORAL | 3 refills | Status: DC

## 2023-01-31 MED ORDER — ACETAMINOPHEN 325 MG PO TABS
650.0000 mg | ORAL_TABLET | Freq: Four times a day (QID) | ORAL | Status: DC | PRN
  Administered 2023-01-31: 650 mg via ORAL
  Filled 2023-01-31: qty 2

## 2023-01-31 MED ORDER — METHIMAZOLE 5 MG PO TABS
5.0000 mg | ORAL_TABLET | Freq: Every morning | ORAL | Status: DC
Start: 2023-01-31 — End: 2023-01-31
  Administered 2023-01-31: 5 mg via ORAL
  Filled 2023-01-31 (×2): qty 1

## 2023-01-31 MED ORDER — DICLOFENAC SODIUM 1 % EX GEL
2.0000 g | Freq: Four times a day (QID) | CUTANEOUS | Status: DC
  Filled 2023-01-31: qty 100

## 2023-01-31 MED ORDER — CLOPIDOGREL BISULFATE 75 MG PO TABS
75.0000 mg | ORAL_TABLET | Freq: Every day | ORAL | Status: DC
  Administered 2023-01-31: 75 mg via ORAL
  Filled 2023-01-31: qty 1

## 2023-01-31 MED ORDER — PANTOPRAZOLE SODIUM 40 MG PO TBEC
40.0000 mg | DELAYED_RELEASE_TABLET | Freq: Every day | ORAL | Status: DC
  Administered 2023-01-31: 40 mg via ORAL
  Filled 2023-01-31: qty 1

## 2023-01-31 MED ORDER — EZETIMIBE 10 MG PO TABS
10.0000 mg | ORAL_TABLET | Freq: Every day | ORAL | Status: DC
  Administered 2023-01-31: 10 mg via ORAL
  Filled 2023-01-31: qty 1

## 2023-01-31 MED ORDER — BISACODYL 5 MG PO TBEC: 5.0000 mg | DELAYED_RELEASE_TABLET | Freq: Every day | ORAL | Status: DC | PRN

## 2023-01-31 MED ORDER — ONDANSETRON HCL 4 MG/2ML IJ SOLN: 4.0000 mg | Freq: Four times a day (QID) | INTRAMUSCULAR | Status: DC | PRN

## 2023-01-31 MED ORDER — INSULIN GLARGINE-YFGN 100 UNIT/ML ~~LOC~~ SOLN
10.0000 [IU] | Freq: Every day | SUBCUTANEOUS | Status: DC
  Administered 2023-01-31: 10 [IU] via SUBCUTANEOUS
  Filled 2023-01-31 (×3): qty 0.1

## 2023-01-31 MED ORDER — ESCITALOPRAM OXALATE 10 MG PO TABS
20.0000 mg | ORAL_TABLET | Freq: Every morning | ORAL | Status: DC
Start: 2023-01-31 — End: 2023-01-31
  Administered 2023-01-31: 20 mg via ORAL
  Filled 2023-01-31: qty 2

## 2023-01-31 MED ORDER — ATORVASTATIN CALCIUM 40 MG PO TABS
80.0000 mg | ORAL_TABLET | Freq: Every day | ORAL | Status: DC
  Administered 2023-01-31: 80 mg via ORAL
  Filled 2023-01-31: qty 2

## 2023-01-31 MED ORDER — LORAZEPAM 0.5 MG PO TABS
0.5000 mg | ORAL_TABLET | Freq: Every day | ORAL | Status: DC
  Administered 2023-01-31: 0.5 mg via ORAL
  Filled 2023-01-31: qty 1

## 2023-01-31 MED ORDER — ONDANSETRON HCL 4 MG PO TABS: 4.0000 mg | ORAL_TABLET | Freq: Four times a day (QID) | ORAL | Status: DC | PRN

## 2023-01-31 MED ORDER — FENTANYL CITRATE PF 50 MCG/ML IJ SOSY
25.0000 ug | PREFILLED_SYRINGE | Freq: Once | INTRAMUSCULAR | Status: AC
  Administered 2023-01-31: 25 ug via INTRAVENOUS
  Filled 2023-01-31: qty 1

## 2023-01-31 MED ORDER — CYCLOBENZAPRINE HCL 10 MG PO TABS: 5.0000 mg | ORAL_TABLET | Freq: Three times a day (TID) | ORAL | Status: DC | PRN

## 2023-01-31 MED ORDER — RIVAROXABAN 10 MG PO TABS
10.0000 mg | ORAL_TABLET | Freq: Every day | ORAL | Status: DC
  Administered 2023-01-31: 10 mg via ORAL
  Filled 2023-01-31: qty 1

## 2023-01-31 MED ORDER — LACTATED RINGERS IV SOLN: INTRAVENOUS | Status: DC

## 2023-01-31 MED ORDER — INSULIN ASPART 100 UNIT/ML IJ SOLN
0.0000 [IU] | INTRAMUSCULAR | Status: DC
  Administered 2023-01-31: 1 [IU] via SUBCUTANEOUS
  Administered 2023-01-31: 5 [IU] via SUBCUTANEOUS
  Administered 2023-01-31: 3 [IU] via SUBCUTANEOUS
  Administered 2023-01-31: 1 [IU] via SUBCUTANEOUS
  Filled 2023-01-31: qty 0.09

## 2023-01-31 MED ORDER — ALBUTEROL SULFATE (2.5 MG/3ML) 0.083% IN NEBU: 3.0000 mL | INHALATION_SOLUTION | Freq: Four times a day (QID) | RESPIRATORY_TRACT | Status: DC | PRN

## 2023-01-31 MED ORDER — METOPROLOL TARTRATE 25 MG PO TABS
100.0000 mg | ORAL_TABLET | Freq: Two times a day (BID) | ORAL | Status: DC
  Administered 2023-01-31 (×2): 100 mg via ORAL
  Filled 2023-01-31 (×2): qty 4

## 2023-01-31 NOTE — Assessment & Plan Note (Signed)
Cont metoprolol Norvasc - pending med rec

## 2023-01-31 NOTE — Assessment & Plan Note (Signed)
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 Dont see pathology report anywhere.

## 2023-01-31 NOTE — Progress Notes (Signed)
EEG complete - results pending 

## 2023-01-31 NOTE — Assessment & Plan Note (Addendum)
Cont home psych meds once med rec completed.

## 2023-01-31 NOTE — Assessment & Plan Note (Addendum)
Cont Xarelto On for h/o DVT? Dont see any h/o A.Fib. Apparently per patient: blood clot in leg, traveled up, through heart, and caused stroke. ? History of PFO? Actually per echo in Sept: "Cannot exclude a small PFO."

## 2023-01-31 NOTE — Procedures (Signed)
Patient Name: Imani Kissell [place]  MRN: [id number]  Epilepsy Attending: Charlsie Quest  Referring Physician/Provider: Hillary Bow, DO  Date: 01/31/2023 Duration: 24.25 mins  Patient history: 52yo F with ams getting eeg to evaluate for seizure  Level of alertness: Awake  AEDs during EEG study: None  Technical aspects: This EEG study was done with scalp electrodes positioned according to the 10-20 International system of electrode placement. Electrical activity was reviewed with band pass filter of 1-70Hz , sensitivity of 7 uV/mm, display speed of 19mm/sec with a 60Hz  notched filter applied as appropriate. EEG data were recorded continuously and digitally stored.  Video monitoring was available and reviewed as appropriate.  Description: The posterior dominant rhythm consists of 7.5 Hz activity of moderate voltage (25-35 uV) seen predominantly in posterior head regions, symmetric and reactive to eye opening and eye closing. EEG showed continuous generalized 5-7hz  theta slowing. There is also intermittent 2-3Hz  delta slowing in left frontal region. Hyperventilation and photic stimulation were not performed.     ABNORMALITY - Intermittent slow, right frontal region - Continuous slow, generalized  IMPRESSION: This study is suggestive of cortical dysfunction arising from right frontal region likely secondary to underlying structural abnormality. Additionally there is mild to moderate diffuse encephalopathy. No seizures or epileptiform discharges were seen throughout the recording.  Adalind Weitz Annabelle Harman

## 2023-01-31 NOTE — Assessment & Plan Note (Addendum)
Unclear cause. MRI brain neg for acute findings today. TSH and T4 nl x9 days ago (not c/w thyroid storm as cause). UA neg Slightly hyperglycemic with BGL 280 but not clear that this would be enough to cause AMS unlike earlier this month when she presented with BGLs in the 500s. Mental status seems back to baseline at time of my eval Possibly recrudescence of prior stroke symptoms in setting of acute illness that we havent yet diagnosed? Or possibly medication induced (ie looks like she's on baclofen TID scheduled + flexeril PRN) or psychogenic? 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 for EEG to make sure not some sort of atypical seizure presentation following h/o strokes. Spoke with Dr. Wilford Corner, he isnt sure he's got much to add at this point. Check ammonia (slightly elevated at 45 x9 days ago). IVF: 1.5L bolus in ED At pt request will get repeat carotid US (she's about due for this anyhow since was supposed to be repeated in Jan 2025), pt states h/o bilateral 75% stenosis, but [place] earlier in year only showed 1-39% on both sides. Allergy to IVC dye + mild creat elevation precludes CTA. Will resume / continue home meds including gabapentin and baclofen If she's gonna get altered due to meds, would rather that this happen during hospital stay so we can see it.

## 2023-01-31 NOTE — Assessment & Plan Note (Addendum)
Home med rec pending. Cont ranexa. Giving one time dose of fentanyl for CP. Trop neg x2 today. S.Tach on EKG but chronically runs s.tach 110-120 during prior admits.

## 2023-01-31 NOTE — H&P (Signed)
History and Physical    Patient: Lori [name] DOB: [birth year] DOA: 01/30/2023 DOS: the patient was seen and examined on 01/31/2023 PCP: NgetichDonalee Citrin, NP  Patient coming from: Home  Chief Complaint:  Chief Complaint  Patient presents with   Altered Mental Status   HPI: Lori [name] is a 52 y.o. female with medical history significant of prior stroke(s) with residual L sided hemiparesis, hyperthyroidism, DM, HTN, pheochromocytoma s/p R adrenalectomy.  Pt in to ED for 3rd time in 2 weeks with reported acute mental status change.  Reportedly pt non-verbal earlier with neuro findings including more pronounced than usual L facial droop maybe, also eyes reportedly not crossing midline earlier.  MRI neg for acute stroke findings.  By the time I'm asked to admit patient she seems to be back to baseline mental status with no obvious deficits, clear speech, AAOx3 and providing history.  Pt with episode of CP in ED.  Trops Neg x2.  Pt is worried that she's at risk for further strokes, says she needs to have her carotid arteries checked and they didn't check these during admit earlier this month (actually looks like she's right, I dont see where they got checked during admit earlier this month).  States h/o stroke due to "blood clot in leg that went through heart and to brain" and that's why she's on xarelto.  Again looks like she could be correct (2d echo in sept couldn't rule out a small PFO), but not sure this tells the whole story of her stroke and risk factors: (pt also has h/o RCVS diagnosed at [facility] back in 2016).   Review of Systems: As mentioned in the history of present illness. All other systems reviewed and are negative. Past Medical History:  Diagnosis Date   CAD (coronary artery disease)    Depression    Diabetes mellitus without complication (HCC)    History of CT scan    History of mammogram    History of MRI     Hypertension    Pheochromocytoma    Stroke [facility])    Thyroid disease    Past Surgical History:  Procedure Laterality Date   ABDOMINAL HYSTERECTOMY     CESAREAN SECTION     3   CORONARY ARTERY BYPASS GRAFT     Right adrenal gland removal for pheochromocytoma Right    Social History:  reports that she has never smoked. She has never used smokeless tobacco. She reports that she does not currently use alcohol. She reports that she does not use drugs.  Allergies  Allergen Reactions   Asa [Aspirin] Anaphylaxis, Swelling and Other (See Comments)    Throat closes   Contrast Media [Iodinated Contrast Media] Anaphylaxis and Swelling   Imitrex [Sumatriptan] Anaphylaxis   Ms Contin [Morphine] Anaphylaxis and Swelling   Penicillins Swelling and Other (See Comments)    Mouth and eyes swell   Firvanq [Vancomycin] Other (See Comments)    Red Man's Syndrome   Cipro [Ciprofloxacin Hcl] Nausea And Vomiting   Nitrofuran Derivatives Anxiety   Wound Dressing Adhesive Itching and Rash    Family History  Problem Relation Age of Onset   Hypertension Mother    Diabetes Mother    Atrial fibrillation Mother    Hypertension Father    Heart attack Father    Dementia Father    Diabetes Brother    Brain cancer Brother    Asthma Daughter    Sickle cell anemia Son  Atrial fibrillation Maternal Uncle     Prior to Admission medications   Medication Sig Start Date End Date Taking? Authorizing Provider  albuterol (PROVENTIL) (2.5 MG/3ML) 0.083% nebulizer solution Take 3 mLs (2.5 mg total) by nebulization every 6 (six) hours as needed for wheezing or shortness of breath. 01/01/23   Ngetich, Dinah C, NP  albuterol (VENTOLIN HFA) 108 (90 Base) MCG/ACT inhaler INHALE 2 PUFFS BY MOUTH EVERY 6 HOURS AS NEEDED FOR WHEEZING AND FOR SHORTNESS OF BREATH Strength: 108 (90 Base) MCG/ACT Patient taking differently: Inhale 2 puffs into the lungs every 6 (six) hours as needed for shortness of breath. 01/01/23    Ngetich, Dinah C, NP  amLODipine (NORVASC) 5 MG tablet Take 10 mg by mouth in the morning.    [provider]  baclofen (LIORESAL) 10 MG tablet Take 1 tablet (10 mg total) by mouth 3 (three) times daily. 01/08/23   Ngetich, Dinah C, NP  bisacodyl (DULCOLAX) 5 MG EC tablet Take 1 tablet (5 mg total) by mouth daily as needed for moderate constipation. 12/06/22   Jerald Kief, MD  clopidogrel (PLAVIX) 75 MG tablet Take 1 tablet (75 mg total) by mouth daily. 01/01/23   Ngetich, Donalee Citrin, NP  Continuous Glucose Receiver (FREESTYLE LIBRE 2 READER) DEVI 1 Device by Does not apply route daily. E11.69 11/01/22   Ngetich, Dinah C, NP  Continuous Glucose Sensor (FREESTYLE LIBRE 14 DAY SENSOR) MISC 1 each by Does not apply route as needed. DX: E11.69 12/24/22   Ngetich, Dinah C, NP  cyclobenzaprine (FLEXERIL) 5 MG tablet TAKE 1 TABLET BY MOUTH 3 TIMES DAILY AS NEEDED FOR MUSCLE SPASMS 11/18/22   Lovorn, Aundra Millet, MD  diclofenac Sodium (VOLTAREN) 1 % GEL Apply 2 g topically 4 (four) times daily.    [provider]  escitalopram (LEXAPRO) 20 MG tablet Take 1 tablet (20 mg total) by mouth every morning. 01/01/23   Ngetich, Dinah C, NP  ezetimibe (ZETIA) 10 MG tablet Take 1 tablet (10 mg total) by mouth daily. 01/01/23   Ngetich, Dinah C, NP  gabapentin (NEURONTIN) 100 MG capsule Take 100 mg by mouth 3 (three) times daily. 11/14/22   [provider]  insulin glargine (LANTUS SOLOSTAR) 100 UNIT/ML Solostar Pen Inject 20 Units into the skin at bedtime. Patient taking differently: Inject 22 Units into the skin at bedtime. 01/09/23   Ngetich, Dinah C, NP  insulin lispro (HUMALOG) 100 UNIT/ML injection Sliding scale AC, HS CBG 121 - 150: 2 units  CBG 151 - 200: 3 units  CBG 201 - 250: 5 units  CBG 251 - 300: 8 units  CBG 301 - 350: 11 units  CBG 351 - 400: 15 units  CBG > 400: call MD 12/06/22   Jerald Kief, MD  insulin lispro (HUMALOG) 100 UNIT/ML injection Inject 0.04 mLs (4 Units total)  into the skin 3 (three) times daily with meals. Patient not taking: Reported on 01/19/2023 01/01/23   Ngetich, Dinah C, NP  LORazepam (ATIVAN) 0.5 MG tablet Take 1 tablet (0.5 mg total) by mouth at bedtime. 12/06/22   Jerald Kief, MD  methimazole (TAPAZOLE) 5 MG tablet Take 1 tablet (5 mg total) by mouth every morning. 01/01/23   Ngetich, Dinah C, NP  metoCLOPramide (REGLAN) 5 MG tablet Take 1 tablet (5 mg total) by mouth 3 (three) times daily before meals. 01/04/23   Danford, Earl Lites, MD  metoprolol tartrate (LOPRESSOR) 100 MG tablet Take 1 tablet (100 mg total) by mouth  2 (two) times daily. 01/08/23   Ngetich, Dinah C, NP  omeprazole (PRILOSEC) 20 MG capsule TAKE 1 CAPSULE(20 MG) BY MOUTH DAILY 01/20/23   Ngetich, Dinah C, NP  polyethylene glycol powder (GLYCOLAX/MIRALAX) 17 GM/SCOOP powder Take 17 g by mouth daily. Hold for loose stool 01/01/23   Ngetich, Dinah C, NP  ranolazine (RANEXA) 500 MG 12 hr tablet Take 1 tablet (500 mg total) by mouth 2 (two) times daily. 10/09/22 01/23/24  [place], Alvira Philips, DO  rivaroxaban (XARELTO) 10 MG TABS tablet Take 1 tablet (10 mg total) by mouth daily. 01/01/23   Ngetich, Dinah C, NP  tamsulosin (FLOMAX) 0.4 MG CAPS capsule Take 0.4 mg by mouth at bedtime. 11/04/22   [provider]  topiramate (TOPAMAX) 25 MG tablet Take 1 tablet (25 mg total) by mouth 2 (two) times daily. 07/10/22   Sherryll Burger, Pratik D, DO  zinc oxide (BALMEX) 11.3 % CREA cream Apply 1 Application topically 2 (two) times daily.    [provider]    Physical Exam: Vitals:   01/30/23 2138 01/30/23 2200 01/30/23 2330 01/31/23 0030  BP: 119/86 (!) 115/92 138/83 (!) 137/93  Pulse: (!) 119 (!) 132 (!) 147 (!) 139  Resp: 18 17 16  (!) 22  Temp: 98.2 F (36.8 C)     TempSrc: Oral     SpO2: 100% 97% 97% 97%   Constitutional: NAD, calm, comfortable Respiratory: clear to auscultation bilaterally, no wheezing, no crackles. Normal respiratory effort. No accessory muscle use.   Cardiovascular: Regular rate and rhythm, no murmurs / rubs / gallops. No extremity edema. 2+ pedal pulses. No carotid bruits.  Abdomen: no tenderness, no masses palpated. No hepatosplenomegaly. Bowel sounds positive.  Neurologic: L sided hemiparesis and facial droop present Psychiatric: Normal judgment and insight. Alert and oriented x 3. Anxious mood.   Data Reviewed:    Labs on Admission: I have personally reviewed following labs and imaging studies  CBC: Recent Labs  Lab 01/30/23 1509  WBC 9.0  HGB 10.8*  HCT 32.3*  MCV 74.9*  PLT 442*   Basic Metabolic Panel: Recent Labs  Lab 01/30/23 1509  NA 132*  K 4.0  CL 101  CO2 19*  GLUCOSE 283*  BUN 29*  CREATININE 1.31*  CALCIUM 8.7*  MG 2.1   GFR: Estimated Creatinine Clearance: 53 mL/min (A) (by C-G formula based on SCr of 1.31 mg/dL (H)). Liver Function Tests: Recent Labs  Lab 01/30/23 1509  AST 13*  ALT 15  ALKPHOS 72  BILITOT 0.4  PROT 6.8  ALBUMIN 3.3*   Recent Labs  Lab 01/30/23 1509  LIPASE 97*   Recent Labs  Lab 01/31/23 0107  AMMONIA 13   Coagulation Profile: No results for input(s): "INR", "PROTIME" in the last 168 hours. Cardiac Enzymes: No results for input(s): "CKTOTAL", "CKMB", "CKMBINDEX", "TROPONINI" in the last 168 hours. BNP (last 3 results) No results for input(s): "PROBNP" in the last 8760 hours. HbA1C: No results for input(s): "HGBA1C" in the last 72 hours. CBG: Recent Labs  Lab 01/30/23 1149 01/31/23 0057  GLUCAP 266* 270*   Lipid Profile: No results for input(s): "CHOL", "HDL", "LDLCALC", "TRIG", "CHOLHDL", "LDLDIRECT" in the last 72 hours. Thyroid Function Tests: No results for input(s): "TSH", "T4TOTAL", "FREET4", "T3FREE", "THYROIDAB" in the last 72 hours. Anemia Panel: No results for input(s): "VITAMINB12", "FOLATE", "FERRITIN", "TIBC", "IRON", "RETICCTPCT" in the last 72 hours. Urine analysis:    Component Value Date/Time   COLORURINE YELLOW 01/30/2023 1755    APPEARANCEUR HAZY (  A) 01/30/2023 1755   LABSPEC 1.013 01/30/2023 1755   PHURINE 5.0 01/30/2023 1755   GLUCOSEU 50 (A) 01/30/2023 1755   HGBUR NEGATIVE 01/30/2023 1755   BILIRUBINUR NEGATIVE 01/30/2023 1755   KETONESUR NEGATIVE 01/30/2023 1755   PROTEINUR NEGATIVE 01/30/2023 1755   NITRITE NEGATIVE 01/30/2023 1755   LEUKOCYTESUR NEGATIVE 01/30/2023 1755    Radiological Exams on Admission: DG CHEST PORT 1 VIEW Result Date: 01/31/2023 CLINICAL DATA:  Acute encephalopathy, altered mental status, chest pain EXAM: PORTABLE CHEST 1 VIEW COMPARISON:  01/22/2023 FINDINGS: Prior CABG. Heart and mediastinal contours are within normal limits. No focal opacities or effusions. No acute bony abnormality. IMPRESSION: No active disease. Electronically Signed   By: Charlett Nose M.D.   On: 01/31/2023 00:53   MR ANGIO HEAD WO CONTRAST Result Date: 01/30/2023 CLINICAL DATA:  Stroke suspected EXAM: MRI HEAD WITHOUT CONTRAST MRA HEAD WITHOUT CONTRAST TECHNIQUE: Multiplanar, multi-echo pulse sequences of the brain and surrounding structures were acquired without intravenous contrast. Angiographic images of the Circle of Willis were acquired using MRA technique without intravenous contrast. COMPARISON:  01/20/2023 MRI head, no prior MRA available FINDINGS: MRI HEAD FINDINGS Brain: No restricted diffusion to suggest acute or subacute infarct. No acute hemorrhage, mass, mass effect, or midline shift. No hydrocephalus or extra-axial collection. Pituitary and craniocervical junction within normal limits. No hemosiderin deposition to suggest remote hemorrhage. Encephalomalacia left cerebellum and left frontal lobe. Remote lacunar infarcts in the basal ganglia, thalami, and pons. Confluent T2 hyperintense signal in the periventricular white matter, likely the sequela of moderate to severe chronic small vessel ischemic disease. Vascular: Normal arterial flow voids. Skull and upper cervical spine: Normal marrow signal.  Sinuses/Orbits: Clear paranasal sinuses. No acute finding in the orbits. Other: The mastoid air cells are well aerated. MRA HEAD FINDINGS Evaluation is somewhat limited by motion. Anterior circulation: Both internal carotid arteries are patent to the termini, with moderate to severe stenosis in the bilateral cavernous segments (series 9, image 106) and likely moderate stenosis in the bilateral supraclinoid segment (series 9, image 121). Patent left A1. Aplastic or stenosed right A1. Normal anterior communicating artery. Anterior cerebral arteries are irregular but grossly patent to their distal aspects without significant stenosis. Mild stenosis in the right M1 (series 9, image 130). No significant stenosis in the left M1. Distal MCA branches are irregular but perfused to their distal aspects without significant stenosis. Posterior circulation: Poor signal in the vertebral arteries, which may be artifactual or reflect stenosis or occlusion. Intermittent irregular signal in the basilar artery, likely multifocal stenosis. The vertebral arteries patent to the vertebrobasilar junction without stenosis. Posterior inferior cerebral arteries patent bilaterally. Basilar patent to its distal aspect. Superior cerebellar arteries patent proximally. Aplastic or hypoplastic P1 segments, with fetal origin of the right and near fetal origin of the left PCA from the posterior communicating arteries, which appear irregular with at least mild multifocal stenosis. PCAs are irregular with at least mild multifocal stenosis, but appear perfused to their distal aspects. Anatomic variants: Fetal or near fetal origin of the PCA. IMPRESSION: 1. No acute intracranial process. No evidence of acute or subacute infarct. 2. Evaluation of the vasculature is somewhat limited by motion. Within this limitation, there is moderate to severe stenosis in the bilateral cavernous segments of the internal carotid arteries and likely moderate stenosis in the  bilateral supraclinoid segments. This could be further evaluated with a CTA of the head if clinically indicated. 3. Mild stenosis in the right M1. 4. Poor signal in  the vertebral arteries, which may be artifactual or reflect stenosis or occlusion. Intermittent irregular signal in the basilar artery, likely multifocal stenosis. 5. Irregularity of the anterior and posterior circulation, which may be due to atherosclerosis and/or motion. Electronically Signed   By: Wiliam Ke M.D.   On: 01/30/2023 22:36   MR BRAIN WO CONTRAST Result Date: 01/30/2023 CLINICAL DATA:  Stroke suspected EXAM: MRI HEAD WITHOUT CONTRAST MRA HEAD WITHOUT CONTRAST TECHNIQUE: Multiplanar, multi-echo pulse sequences of the brain and surrounding structures were acquired without intravenous contrast. Angiographic images of the Circle of Willis were acquired using MRA technique without intravenous contrast. COMPARISON:  01/20/2023 MRI head, no prior MRA available FINDINGS: MRI HEAD FINDINGS Brain: No restricted diffusion to suggest acute or subacute infarct. No acute hemorrhage, mass, mass effect, or midline shift. No hydrocephalus or extra-axial collection. Pituitary and craniocervical junction within normal limits. No hemosiderin deposition to suggest remote hemorrhage. Encephalomalacia left cerebellum and left frontal lobe. Remote lacunar infarcts in the basal ganglia, thalami, and pons. Confluent T2 hyperintense signal in the periventricular white matter, likely the sequela of moderate to severe chronic small vessel ischemic disease. Vascular: Normal arterial flow voids. Skull and upper cervical spine: Normal marrow signal. Sinuses/Orbits: Clear paranasal sinuses. No acute finding in the orbits. Other: The mastoid air cells are well aerated. MRA HEAD FINDINGS Evaluation is somewhat limited by motion. Anterior circulation: Both internal carotid arteries are patent to the termini, with moderate to severe stenosis in the bilateral cavernous  segments (series 9, image 106) and likely moderate stenosis in the bilateral supraclinoid segment (series 9, image 121). Patent left A1. Aplastic or stenosed right A1. Normal anterior communicating artery. Anterior cerebral arteries are irregular but grossly patent to their distal aspects without significant stenosis. Mild stenosis in the right M1 (series 9, image 130). No significant stenosis in the left M1. Distal MCA branches are irregular but perfused to their distal aspects without significant stenosis. Posterior circulation: Poor signal in the vertebral arteries, which may be artifactual or reflect stenosis or occlusion. Intermittent irregular signal in the basilar artery, likely multifocal stenosis. The vertebral arteries patent to the vertebrobasilar junction without stenosis. Posterior inferior cerebral arteries patent bilaterally. Basilar patent to its distal aspect. Superior cerebellar arteries patent proximally. Aplastic or hypoplastic P1 segments, with fetal origin of the right and near fetal origin of the left PCA from the posterior communicating arteries, which appear irregular with at least mild multifocal stenosis. PCAs are irregular with at least mild multifocal stenosis, but appear perfused to their distal aspects. Anatomic variants: Fetal or near fetal origin of the PCA. IMPRESSION: 1. No acute intracranial process. No evidence of acute or subacute infarct. 2. Evaluation of the vasculature is somewhat limited by motion. 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 This could be further evaluated with a CTA of the head if clinically indicated. 3. Mild stenosis in the right M1. 4. Poor signal in the vertebral arteries, which may be artifactual or reflect stenosis or occlusion. Intermittent irregular signal in the basilar artery, likely multifocal  stenosis. 5. Irregularity of the anterior and posterior circulation, which may be due to atherosclerosis and/or motion. Electronically Signed   By: Wiliam Ke M.D.   On: 01/30/2023 22:36   CT Head Wo Contrast Result Date: 01/30/2023 CLINICAL DATA:  Neuro deficit, acute, stroke suspected EXAM: CT HEAD WITHOUT CONTRAST TECHNIQUE: Contiguous axial images  were obtained from the base of the skull through the vertex without intravenous contrast. RADIATION DOSE REDUCTION: This exam was performed according to the departmental dose-optimization program which includes automated exposure control, adjustment of the mA and/or kV according to patient size and/or use of iterative reconstruction technique. COMPARISON:  Head CT 01/22/2023 FINDINGS: Brain: Chronic infarcts in the left cerebellum, left frontal lobe, bilateral basal ganglia. There is a background of mild chronic microvascular ischemic change. No hemorrhage. No hydrocephalus. No CT evidence of an acute cortical infarct. No mass effect. No lesion. Vascular: No hyperdense vessel or unexpected calcification. Vascular stents in the carotid siphons bilaterally. Skull: Normal. Negative for fracture or focal lesion. Sinuses/Orbits: No middle ear or mastoid effusion. Paranasal sinuses are clear. Orbits are unremarkable. Other: None. IMPRESSION: 1. No hemorrhage or CT evidence of an acute cortical infarct. 2. 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 Electronically Signed   By: Lorenza Cambridge M.D.   On: 01/30/2023 14:03    EKG: Independently reviewed.   Assessment and Plan: * Acute encephalopathy Unclear cause. MRI brain neg for acute findings today. TSH and T4 nl x9 days ago (not c/w thyroid storm as cause). UA neg Slightly hyperglycemic with BGL 280 but not clear that this would be enough to cause AMS unlike earlier this month when she presented with BGLs in the 500s. Mental status seems back to baseline at time  of my eval Possibly recrudescence of prior stroke symptoms in setting of acute illness that we havent yet diagnosed? Or possibly medication induced (ie looks like she's on baclofen TID scheduled + flexeril PRN) or psychogenic? Challenging situation however as pt clearly does have severe dz (severe stroke(s) at early age with resultant major debility, multiple stroke risk factors including RCVS, possible PFO, uncontrolled DM)  Check CXR Admitting over to [facility] for EEG to make sure not some sort of atypical seizure presentation following h/o strokes. Spoke with Dr. Wilford Corner, he isnt sure he's got much to add at this point. Check ammonia (slightly elevated at 45 x9 days ago). IVF: 1.5L bolus in ED At pt request will get repeat carotid US (she's about due for this anyhow since was supposed to be repeated in Jan 2025), pt states h/o bilateral 75% stenosis, but [place] earlier in year only showed 1-39% on both sides. Allergy to IVC dye + mild creat elevation precludes CTA. Will resume / continue home meds including gabapentin and baclofen If she's gonna get altered due to meds, would rather that this happen during hospital stay so we can see it.  Generalized anxiety disorder Cont home psych meds once med rec completed.  Left hemiplegia (HCC) From prior stroke. Apparently pt with known Reversible Cerebral Vasoconstriction Syndrome diagnosis at age 79 (also contributing to stroke) - per [facility] work up Pt makes it sound more like embolic from DVT via PFO when I spoke to her though. Med rec pending. Plan to continue home meds (Statin, Xarelto, plavix)  Tachycardia S.tach with h/o same, usually 110-120s it looks like, going 139 in ED at this time. Reviewed current and earlier EKGs, still think S.Tach at this point (as opposed to Flutter 2:1) given variability in rate since first EKG at 11am. Tele monitor Resume home metoprolol, just got dose in ED Add IV metoprolol if HR doesn't come down shortly. Seems to be  asymptomatic from HR standpoint. Trop neg x2.  Chronic anticoagulation Cont Xarelto On for h/o DVT? Dont see any h/o A.Fib. Apparently  per patient: blood clot in leg, traveled up, through heart, and caused stroke. ? History of PFO? Actually per echo in Sept: "Cannot exclude a small PFO."  Chronic diastolic CHF (congestive heart failure) (HCC) Med rec pending No resp complaints.  Coronary artery disease with chronic angina Home med rec pending. Cont ranexa. Giving one time dose of fentanyl for CP. Trop neg x2 today. S.Tach on EKG but chronically runs s.tach 110-120 during prior admits.  Insulin dependent type 2 diabetes mellitus (HCC) Ordering half home lantus dose for the moment. SSI sensitive scale Q4H.  Essential hypertension Cont metoprolol Norvasc - pending med rec  Hyperthyroidism Cont tapazole and lopressor Euthyroid as of 9 days ago on labs.  Pheochromocytoma S/p adrenalectomy for this in past. Dont see pathology report anywhere.      Advance Care Planning:   Code Status: Full Code  Consults: EDP and I discussed with neuro as above  Family Communication: No family in room  Severity of Illness: The appropriate patient status for this patient is INPATIENT. Inpatient status is judged to be reasonable and necessary in order to provide the required intensity of service to ensure the patient's safety. The patient's presenting symptoms, physical exam findings, and initial radiographic and laboratory data in the context of their chronic comorbidities is felt to place them at high risk for further clinical deterioration. Furthermore, it is not anticipated that the patient will be medically stable for discharge from the hospital within 2 midnights of admission.   * I certify that at the point of admission it is my clinical judgment that the patient will require inpatient hospital care spanning beyond 2 midnights from the point of admission due to high intensity of  service, high risk for further deterioration and high frequency of surveillance required.*  Author: Hillary Bow., DO 01/31/2023 1:05 AM  For on call review [email or web address].

## 2023-01-31 NOTE — Assessment & Plan Note (Addendum)
From prior stroke. Apparently pt with known Reversible Cerebral Vasoconstriction Syndrome diagnosis at age 52 (also contributing to stroke) - per [facility] work up Pt makes it sound more like embolic from DVT via PFO when I spoke to her though. Med rec pending. Plan to continue home meds (Statin, Xarelto, plavix)

## 2023-01-31 NOTE — Assessment & Plan Note (Signed)
Med rec pending No resp complaints.

## 2023-01-31 NOTE — Assessment & Plan Note (Signed)
Ordering half home lantus dose for the moment. SSI sensitive scale Q4H.

## 2023-01-31 NOTE — Progress Notes (Signed)
Carotid artery duplex has been completed. Preliminary results can be found in CV Proc through chart review.   01/31/23 9:08 AM Olen Cordial RVT

## 2023-01-31 NOTE — Assessment & Plan Note (Addendum)
Cont tapazole and lopressor Euthyroid as of 9 days ago on labs.

## 2023-01-31 NOTE — Assessment & Plan Note (Addendum)
S.tach with h/o same, usually 110-120s it looks like, going 139 in ED at this time. Reviewed current and earlier EKGs, still think S.Tach at this point (as opposed to Flutter 2:1) given variability in rate since first EKG at 11am. Tele monitor Resume home metoprolol, just got dose in ED Add IV metoprolol if HR doesn't come down shortly. Seems to be asymptomatic from HR standpoint. Trop neg x2.

## 2023-01-31 NOTE — ED Notes (Signed)
Ptar set up

## 2023-01-31 NOTE — Discharge Summary (Signed)
Physician Discharge Summary   Patient: Lori [name] MRN: [id number] DOB: 11-26-1970  Admit date:     01/30/2023  Discharge date: 01/31/23  Discharge Physician: Almon Hercules   PCP: Caesar Bookman, NP   Recommendations at discharge:   Outpatient follow-up with PCP in 1 to 2 weeks Outpatient follow-up with neurology in 3 to 4 weeks Outpatient follow-up with ophthalmology Reassess blood pressure, CBC and CMP at follow-up Avoid hypotension  Discharge Diagnoses: Principal Problem:   Acute encephalopathy Active Problems:   Tachycardia   Left hemiplegia (HCC)   Generalized anxiety disorder   Hyperthyroidism   Essential hypertension   Insulin dependent type 2 diabetes mellitus (HCC)   Coronary artery disease with chronic angina   Chronic diastolic CHF (congestive heart failure) (HCC)   Chronic anticoagulation   OSA on CPAP    Hospital Course: 52 year old F with PMH of 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  Reportedly nonverbal with some left facial droop.  However, patient was awake alert and oriented x 3 providing history on admission.  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  Patient was slightly tachycardic but normotensive. Cr 1.31.  BUN 29.  Glucose 283.  Lipase 97.  LFT within normal.  Serial troponin negative.  Hgb 10.8.  UA without significant finding.  Carotid ultrasound ordered.  Admitting provider discussed with on-call neurologist, Dr. Laurence Slate who didn't feel there is much to add.   The next day, patient's presenting symptoms resolved.  Carotid ultrasound without significant finding.  She was awake,  alert and oriented x 4. She states returns of sensory functions in LUE.  She has chronic LUE paresis and contracture.  Discussed patient with on-call neurology, Dr. Wadie Lessen who recommended discontinuing amlodipine to avoid hypotension given intracranial stenosis.  He also recommended outpatient follow-up with neurology and ophthalmology.  Assessment and Plan: Transient acute encephalopathy/aphasia/left facial droop: Could be due to hypotension with underlying intracranial arthrosclerosis/stenosis.  She also have history of anxiety with behavioral change at times.  She is already Xarelto, Plavix and high intensity statin.  Presenting symptoms resolved.  Workup reassuring. -Discussed with on-call neurologist again.  No further inpatient workup.   -Neurology recommended discontinuing amlodipine to avoid hypotension given intracranial stenosis. -Continue home Xarelto, Plavix, Lipitor and Zetia.  Sinus tachycardia: Resolved with IV fluid hydration -Continue home metoprolol  AKI: Cr 1.31 (baseline 0.9-1.0).  Likely due to dehydration.  AKI with IV fluid hydration. Recent Labs    01/04/23 0552 01/19/23 1230 01/19/23 1827 01/19/23 2205 01/20/23 0351 01/21/23 0534 01/22/23 1824 01/23/23 0634 01/30/23 1509 01/31/23 0456  BUN <5* 47* 36* 30* 25* 19 18 16  29* 21*  CREATININE 0.75 2.15* 1.50* 1.16* 0.97 0.97 0.98 1.04* 1.31* 1.05*  -Discontinued amlodipine to avoid hypotension -Check renal function in 1 to 2 weeks  History of CAD with chronic angina: Serial troponin negative.  EKG with sinus tachycardia and QTc. -Continue home Ranexa, metoprolol, Lipitor, Plavix and Zetia.  History of CVA with left hemiparesis/LUE contracture: CVA workup as above. -Continue home Xarelto, Plavix, Lipitor and Zetia.  IDDM-2 with hyperglycemia: A1c 8.5% on 10/20. Recent Labs  Lab 01/30/23 1149 01/31/23 0057 01/31/23 0427 01/31/23 0827 01/31/23 1227  GLUCAP 266* 270*  210* 130* 135*  -Continue home  meds  Essential hypertension: Normotensive for most part. -Discontinued amlodipine as above -Continue home metoprolol  Hyperthyroidism?  Recent TSH within normal.  Free T4 on higher side of normal. -Continue home meds  Pheochromocytoma s/p right adrenalectomy   Consultants: Neurology over the phone Procedures performed: None Disposition: Home Diet recommendation:  Discharge Diet Orders (From admission, onward)     Start     Ordered   01/31/23 0000  Diet - low sodium heart healthy        01/31/23 1023   01/31/23 0000  Diet Carb Modified        01/31/23 1023           Cardiac and Carb modified diet DISCHARGE MEDICATION: Allergies as of 01/31/2023       Reactions   Asa [aspirin] Anaphylaxis, Swelling, Other (See Comments)   Throat closes   Contrast Media [iodinated Contrast Media] Anaphylaxis, Swelling   Imitrex [sumatriptan] Anaphylaxis   Ms Contin [morphine] Anaphylaxis, Swelling   Penicillins Swelling, Other (See Comments)   Mouth and eyes swell   Firvanq [vancomycin] Other (See Comments)   Red Man's Syndrome   Cipro [ciprofloxacin Hcl] Nausea And Vomiting   Nitrofuran Derivatives Anxiety   Wound Dressing Adhesive Itching, Rash        Medication List     STOP taking these medications    amLODipine 5 MG tablet Commonly known as: NORVASC   omeprazole 20 MG capsule Commonly known as: PRILOSEC Replaced by: pantoprazole 40 MG tablet       TAKE these medications    albuterol 108 (90 Base) MCG/ACT inhaler Commonly known as: VENTOLIN HFA INHALE 2 PUFFS BY MOUTH EVERY 6 HOURS AS NEEDED FOR WHEEZING AND FOR SHORTNESS OF BREATH Strength: 108 (90 Base) MCG/ACT What changed:  how much to take how to take this when to take this reasons to take this additional instructions   albuterol (2.5 MG/3ML) 0.083% nebulizer solution Commonly known as: PROVENTIL Take 3 mLs (2.5 mg total) by nebulization every 6 (six) hours as needed for wheezing or shortness of  breath. What changed: Another medication with the same name was changed. Make sure you understand how and when to take each.   atorvastatin 80 MG tablet Commonly known as: LIPITOR Take 80 mg by mouth daily.   baclofen 10 MG tablet Commonly known as: LIORESAL Take 1 tablet (10 mg total) by mouth 3 (three) times daily.   bisacodyl 5 MG EC tablet Commonly known as: DULCOLAX Take 1 tablet (5 mg total) by mouth daily as needed for moderate constipation.   clopidogrel 75 MG tablet Commonly known as: PLAVIX TAKE 1 TABLET (75 MG TOTAL) BY MOUTH DAILY.   cyclobenzaprine 5 MG tablet Commonly known as: FLEXERIL TAKE 1 TABLET BY MOUTH 3 TIMES DAILY AS NEEDED FOR MUSCLE SPASMS What changed: when to take this   escitalopram 20 MG tablet Commonly known as: LEXAPRO Take 1 tablet (20 mg total) by mouth every morning.   ezetimibe 10 MG tablet Commonly known as: ZETIA TAKE 1 TABLET (10 MG TOTAL) BY MOUTH DAILY.   FreeStyle Libre 14 Day Sensor Misc 1 each by Does not apply route as needed. DX: E11.69   FreeStyle Libre 2 Reader Devi 1 Device by Does not apply route daily. E11.69   gabapentin 100 MG capsule Commonly known as: NEURONTIN Take 100 mg by mouth 3 (three) times daily.   insulin lispro 100 UNIT/ML injection Commonly known as: HUMALOG Sliding scale  AC, HS CBG 121 - 150: 2 units  CBG 151 - 200: 3 units  CBG 201 - 250: 5 units  CBG 251 - 300: 8 units  CBG 301 - 350: 11 units  CBG 351 - 400: 15 units  CBG > 400: call MD   Lantus SoloStar 100 UNIT/ML Solostar Pen Generic drug: insulin glargine Inject 20 Units into the skin at bedtime. What changed: how much to take   LORazepam 0.5 MG tablet Commonly known as: ATIVAN Take 1 tablet (0.5 mg total) by mouth at bedtime.   methimazole 5 MG tablet Commonly known as: TAPAZOLE Take 1 tablet (5 mg total) by mouth every morning.   metoCLOPramide 5 MG tablet Commonly known as: REGLAN Take 1 tablet (5 mg total) by mouth 3  (three) times daily before meals.   metoprolol tartrate 100 MG tablet Commonly known as: LOPRESSOR Take 1 tablet (100 mg total) by mouth 2 (two) times daily.   pantoprazole 40 MG tablet Commonly known as: PROTONIX Take 1 tablet (40 mg total) by mouth daily. Start taking on: February 01, 2023 Replaces: omeprazole 20 MG capsule   polyethylene glycol powder 17 GM/SCOOP powder Commonly known as: GLYCOLAX/MIRALAX Take 17 g by mouth daily. Hold for loose stool   ranolazine 500 MG 12 hr tablet Commonly known as: RANEXA Take 1 tablet (500 mg total) by mouth 2 (two) times daily.   rivaroxaban 10 MG Tabs tablet Commonly known as: XARELTO Take 1 tablet (10 mg total) by mouth daily.   tamsulosin 0.4 MG Caps capsule Commonly known as: FLOMAX Take 0.4 mg by mouth at bedtime.   topiramate 25 MG tablet Commonly known as: TOPAMAX Take 1 tablet (25 mg total) by mouth 2 (two) times daily.   Voltaren 1 % Gel Generic drug: diclofenac Sodium Apply 2 g topically 4 (four) times daily.   zinc oxide 11.3 % Crea cream Commonly known as: BALMEX Apply 1 Application topically 2 (two) times daily.        Follow-up Information     [facility]. Schedule an appointment as soon as possible for a visit in 3 week(s).   Specialty: Neurology Contact information: [address] [place] [address] [phone]        Ngetich, Donalee Citrin, NP. Schedule an appointment as soon as possible for a visit in 1 week(s).   Specialty: Family Medicine Contact information: [address] [place] [address] [phone]                Discharge Exam: GENERAL: No apparent distress.  Nontoxic. HEENT: MMM.  Vision and hearing grossly intact.  NECK: Supple.  No apparent JVD.  RESP:  No IWOB.  Fair aeration bilaterally. CVS:  RRR. Heart sounds normal.  ABD/GI/GU: BS+. Abd soft, NTND.  MSK/EXT:   No apparent deformity. Moves extremities except LUE.   LUE contractures. SKIN: no apparent skin lesion or wound NEURO: Awake and alert. Oriented x 4.  Septal left facial droop (chronic).  Moves extremities.  LUE contracture and deformity. PSYCH: Calm. Normal affect.   Condition at discharge: fair  The results of significant diagnostics from this hospitalization (including imaging, microbiology, ancillary and laboratory) are listed below for reference.   Imaging Studies: VAS US CAROTID Result Date: 01/31/2023 Carotid Arterial Duplex Study Patient Name:  MICHE BRIERE [facility]  Date of Exam:   01/31/2023 Medical Rec #: [id number]  Accession #:    [id number] Date of Birth: [birth year]                             Patient Gender: F Patient Age:   73 years Exam Location:  [facility] Procedure:      VAS US CAROTID Referring Phys: Lyda Perone --------------------------------------------------------------------------------  Indications:       CVA. Risk Factors:      Hypertension, Diabetes. Limitations        Today's exam was limited due to the patient's respiratory                    variation and patient movement. Comparison Study:  02/15/2022 - Right Carotid: Velocities in the right ICA are                    consistent with a 1-39%                    stenosis.                    Non-hemodynamically significant plaque <50% noted in the                    CCA.                     Left Carotid: Velocities in the left ICA are consistent with                    a 1-39%                    stenosis.                    Non-hemodynamically significant plaque <50% noted in the                    CCA.                     Vertebrals: Bilateral vertebral arteries demonstrate                    antegrade flow.                    Subclavians: Normal flow hemodynamics were seen in bilateral                    subclavian                    arteries. Performing Technologist: Chanda Busing RVT  Examination Guidelines: A complete  evaluation includes B-mode imaging, spectral Doppler, color Doppler, and power Doppler as needed of all accessible portions of each vessel. Bilateral testing is considered an integral part of a complete examination. Limited examinations for reoccurring indications may be performed as noted.  Right Carotid Findings: +----------+--------+--------+--------+-----------------------+--------+           PSV cm/sEDV cm/sStenosisPlaque Description     Comments +----------+--------+--------+--------+-----------------------+--------+ CCA Prox  64      22              smooth and heterogenous         +----------+--------+--------+--------+-----------------------+--------+ CCA Distal73      25              smooth and heterogenous         +----------+--------+--------+--------+-----------------------+--------+  ICA Prox  88      22              smooth and heterogenous         +----------+--------+--------+--------+-----------------------+--------+ ICA Mid   42      20                                              +----------+--------+--------+--------+-----------------------+--------+ ICA Distal50      24                                              +----------+--------+--------+--------+-----------------------+--------+ ECA       81      15                                              +----------+--------+--------+--------+-----------------------+--------+ +----------+--------+-------+--------+-------------------+           PSV cm/sEDV cmsDescribeArm Pressure (mmHG) +----------+--------+-------+--------+-------------------+ [id number]                                        +----------+--------+-------+--------+-------------------+ +---------+--------+--+--------+-+---------+ VertebralPSV cm/s23EDV cm/s8Antegrade +---------+--------+--+--------+-+---------+  Left Carotid Findings: +----------+--------+--------+--------+-----------------------+--------+            PSV cm/sEDV cm/sStenosisPlaque Description     Comments +----------+--------+--------+--------+-----------------------+--------+ CCA Prox  87      33              smooth and heterogenous         +----------+--------+--------+--------+-----------------------+--------+ CCA Distal101     40              smooth and heterogenous         +----------+--------+--------+--------+-----------------------+--------+ ICA Prox  56      20              smooth and heterogenous         +----------+--------+--------+--------+-----------------------+--------+ ICA Mid   54      24                                              +----------+--------+--------+--------+-----------------------+--------+ ICA Distal56      29                                              +----------+--------+--------+--------+-----------------------+--------+ ECA       195     41                                              +----------+--------+--------+--------+-----------------------+--------+ +----------+--------+--------+--------+-------------------+           PSV cm/sEDV cm/sDescribeArm Pressure (mmHG) +----------+--------+--------+--------+-------------------+ [id number]                                         +----------+--------+--------+--------+-------------------+ +---------+--------+--+--------+-+--------------+  VertebralPSV cm/s49EDV cm/s6High resistant +---------+--------+--+--------+-+--------------+   Summary: Right Carotid: Velocities in the right ICA are consistent with a 1-39% stenosis. Left Carotid: Velocities in the left ICA are consistent with a 1-39% stenosis. Vertebrals: Right vertebral artery demonstrates antegrade flow. Left vertebral             artery demonstrates high resistant flow. *See table(s) above for measurements and observations.  Electronically signed by Coral Else MD on 01/31/2023 at 11:20:45 AM.    Final    DG CHEST PORT 1 VIEW Result Date:  01/31/2023 CLINICAL DATA:  Acute encephalopathy, altered mental status, chest pain EXAM: PORTABLE CHEST 1 VIEW COMPARISON:  01/22/2023 FINDINGS: Prior CABG. Heart and mediastinal contours are within normal limits. No focal opacities or effusions. No acute bony abnormality. IMPRESSION: No active disease. Electronically Signed   By: Charlett Nose M.D.   On: 01/31/2023 00:53   MR ANGIO HEAD WO CONTRAST Result Date: 01/30/2023 CLINICAL DATA:  Stroke suspected EXAM: MRI HEAD WITHOUT CONTRAST MRA HEAD WITHOUT CONTRAST TECHNIQUE: Multiplanar, multi-echo pulse sequences of the brain and surrounding structures were acquired without intravenous contrast. Angiographic images of the Circle of Willis were acquired using MRA technique without intravenous contrast. COMPARISON:  01/20/2023 MRI head, no prior MRA available FINDINGS: MRI HEAD FINDINGS Brain: No restricted diffusion to suggest acute or subacute infarct. No acute hemorrhage, mass, mass effect, or midline shift. No hydrocephalus or extra-axial collection. Pituitary and craniocervical junction within normal limits. No hemosiderin deposition to suggest remote hemorrhage. Encephalomalacia left cerebellum and left frontal lobe. Remote lacunar infarcts in the basal ganglia, thalami, and pons. Confluent T2 hyperintense signal in the periventricular white matter, likely the sequela of moderate to severe chronic small vessel ischemic disease. Vascular: Normal arterial flow voids. Skull and upper cervical spine: Normal marrow signal. Sinuses/Orbits: Clear paranasal sinuses. No acute finding in the orbits. Other: The mastoid air cells are well aerated. MRA HEAD FINDINGS Evaluation is somewhat limited by motion. Anterior circulation: Both internal carotid arteries are patent to the termini, with moderate to severe stenosis in the bilateral cavernous segments (series 9, image 106) and likely moderate stenosis in the bilateral supraclinoid segment (series 9, image 121). Patent  left A1. Aplastic or stenosed right A1. Normal anterior communicating artery. Anterior cerebral arteries are irregular but grossly patent to their distal aspects without significant stenosis. Mild stenosis in the right M1 (series 9, image 130). No significant stenosis in the left M1. Distal MCA branches are irregular but perfused to their distal aspects without significant stenosis. Posterior circulation: Poor signal in the vertebral arteries, which may be artifactual or reflect stenosis or occlusion. Intermittent irregular signal in the basilar artery, likely multifocal stenosis. The vertebral arteries patent to the vertebrobasilar junction without stenosis. Posterior inferior cerebral arteries patent bilaterally. Basilar patent to its distal aspect. Superior cerebellar arteries patent proximally. Aplastic or hypoplastic P1 segments, with fetal origin of the right and near fetal origin of the left PCA from the posterior communicating arteries, which appear irregular with at least mild multifocal stenosis. PCAs are irregular with at least mild multifocal stenosis, but appear perfused to their distal aspects. Anatomic variants: Fetal or near fetal origin of the PCA. IMPRESSION: 1. No acute intracranial process. No evidence of acute or subacute infarct. 2. Evaluation of the vasculature is somewhat limited by motion. 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 This could be further  evaluated with a CTA of the head if clinically indicated. 3. Mild stenosis in the right M1. 4. Poor signal in the vertebral arteries, which may be artifactual or reflect stenosis or occlusion. Intermittent irregular signal in the basilar artery, likely multifocal stenosis. 5. Irregularity of the anterior and posterior circulation, which may be due to atherosclerosis and/or motion.  Electronically Signed   By: Wiliam Ke M.D.   On: 01/30/2023 22:36   MR BRAIN WO CONTRAST Result Date: 01/30/2023 CLINICAL DATA:  Stroke suspected EXAM: MRI HEAD WITHOUT CONTRAST MRA HEAD WITHOUT CONTRAST TECHNIQUE: Multiplanar, multi-echo pulse sequences of the brain and surrounding structures were acquired without intravenous contrast. Angiographic images of the Circle of Willis were acquired using MRA technique without intravenous contrast. COMPARISON:  01/20/2023 MRI head, no prior MRA available FINDINGS: MRI HEAD FINDINGS Brain: No restricted diffusion to suggest acute or subacute infarct. No acute hemorrhage, mass, mass effect, or midline shift. No hydrocephalus or extra-axial collection. Pituitary and craniocervical junction within normal limits. No hemosiderin deposition to suggest remote hemorrhage. Encephalomalacia left cerebellum and left frontal lobe. Remote lacunar infarcts in the basal ganglia, thalami, and pons. Confluent T2 hyperintense signal in the periventricular white matter, likely the sequela of moderate to severe chronic small vessel ischemic disease. Vascular: Normal arterial flow voids. Skull and upper cervical spine: Normal marrow signal. Sinuses/Orbits: Clear paranasal sinuses. No acute finding in the orbits. Other: The mastoid air cells are well aerated. MRA HEAD FINDINGS Evaluation is somewhat limited by motion. Anterior circulation: Both internal carotid arteries are patent to the termini, with moderate to severe stenosis in the bilateral cavernous segments (series 9, image 106) and likely moderate stenosis in the bilateral supraclinoid segment (series 9, image 121). Patent left A1. Aplastic or stenosed right A1. Normal anterior communicating artery. Anterior cerebral arteries are irregular but grossly patent to their distal aspects without significant stenosis. Mild stenosis in the right M1 (series 9, image 130). No significant stenosis in the left M1. Distal MCA branches are  irregular but perfused to their distal aspects without significant stenosis. Posterior circulation: Poor signal in the vertebral arteries, which may be artifactual or reflect stenosis or occlusion. Intermittent irregular signal in the basilar artery, likely multifocal stenosis. The vertebral arteries patent to the vertebrobasilar junction without stenosis. Posterior inferior cerebral arteries patent bilaterally. Basilar patent to its distal aspect. Superior cerebellar arteries patent proximally. Aplastic or hypoplastic P1 segments, with fetal origin of the right and near fetal origin of the left PCA from the posterior communicating arteries, which appear irregular with at least mild multifocal stenosis. PCAs are irregular with at least mild multifocal stenosis, but appear perfused to their distal aspects. Anatomic variants: Fetal or near fetal origin of the PCA. IMPRESSION: 1. No acute intracranial process. No evidence of acute or subacute infarct. 2. Evaluation of the vasculature is somewhat limited by motion. 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 This could be further evaluated with a CTA of the head if clinically indicated. 3. Mild stenosis in the right M1. 4. Poor signal in the vertebral arteries, which may be artifactual or reflect stenosis or occlusion. Intermittent irregular signal in the basilar artery, likely multifocal stenosis. 5. Irregularity of the anterior and posterior circulation, which may be due to atherosclerosis and/or motion. Electronically Signed   By: Wiliam Ke M.D.   On: 01/30/2023 22:36   CT Head Wo  Contrast Result Date: 01/30/2023 CLINICAL DATA:  Neuro deficit, acute, stroke suspected EXAM: CT HEAD WITHOUT CONTRAST TECHNIQUE: Contiguous axial images were obtained from the base of the skull through the vertex without  intravenous contrast. RADIATION DOSE REDUCTION: This exam was performed according to the departmental dose-optimization program which includes automated exposure control, adjustment of the mA and/or kV according to patient size and/or use of iterative reconstruction technique. COMPARISON:  Head CT 01/22/2023 FINDINGS: Brain: Chronic infarcts in the left cerebellum, left frontal lobe, bilateral basal ganglia. There is a background of mild chronic microvascular ischemic change. No hemorrhage. No hydrocephalus. No CT evidence of an acute cortical infarct. No mass effect. No lesion. Vascular: No hyperdense vessel or unexpected calcification. Vascular stents in the carotid siphons bilaterally. Skull: Normal. Negative for fracture or focal lesion. Sinuses/Orbits: No middle ear or mastoid effusion. Paranasal sinuses are clear. Orbits are unremarkable. Other: None. IMPRESSION: 1. No hemorrhage or CT evidence of an acute cortical infarct. 2. 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 Electronically Signed   By: Lorenza Cambridge M.D.   On: 01/30/2023 14:03   DG Chest 2 View Result Date: 01/22/2023 CLINICAL DATA:  Altered mental status. Restricted left arm movement. EXAM: CHEST - 2 VIEW COMPARISON:  01/19/2023 FINDINGS: The lateral view is limited by overlapping of the patient's arm. Normal sized heart. Clear lungs with normal vascularity. Median sternotomy wires. Mildly tortuous and partially calcified thoracic aorta. Unremarkable bones. IMPRESSION: No acute abnormality. Electronically Signed   By: Beckie Salts M.D.   On: 01/22/2023 17:25   CT Head Wo Contrast Result Date: 01/22/2023 CLINICAL DATA:  Mental status change, unknown cause EXAM: CT HEAD WITHOUT CONTRAST TECHNIQUE: Contiguous axial images were obtained from the base of the skull through the vertex without intravenous contrast. RADIATION DOSE REDUCTION: This exam was performed according to the departmental  dose-optimization program which includes automated exposure control, adjustment of the mA and/or kV according to patient size and/or use of iterative reconstruction technique. COMPARISON:  CT head 01/20/2023. FINDINGS: Brain: No evidence of acute infarction, hemorrhage, hydrocephalus, extra-axial collection or mass lesion/mass effect. Remote left cerebellar infarct. Remote basal ganglia lacunar infarcts. Remote left frontal cortical infarct. Patchy white matter hypodensities are nonspecific but compatible with chronic microvascular disease. Vascular: No hyperdense vessel. Skull: No acute fracture. Sinuses/Orbits: Clear sinuses.  No acute orbital findings. Other: No mastoid effusions. IMPRESSION: 1. No evidence of acute intracranial abnormality. 2. 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 Electronically Signed   By: Feliberto Harts M.D.   On: 01/22/2023 16:44   MR BRAIN WO CONTRAST Result Date: 01/20/2023 CLINICAL DATA:  Neuro deficit, acute, stroke suspected. Right-sided numbness and slurred speech. EXAM: MRI HEAD WITHOUT CONTRAST TECHNIQUE: Multiplanar, multiecho pulse sequences of the brain and surrounding structures were obtained without intravenous contrast. COMPARISON:  Head CT 01/20/2023 and MRI 11/04/2022 FINDINGS: A complete MRI could not be obtained due to the patient's condition. Axial and coronal diffusion, sagittal T1, axial and coronal T2, and axial FLAIR sequences were obtained and are motion degraded, including severe motion on the coronal T2 sequence. Brain: Diffusion-weighted imaging is of good quality, and there is no evidence of an acute infarct. Advanced chronic small-vessel ischemic disease is again noted in the cerebral white matter with chronic infarcts in the deep white matter and deep gray nuclei bilaterally. A small chronic left frontal cortical infarct and moderate-sized chronic left cerebellar infarcts are also again noted. There is wallerian degeneration  involving the  brainstem on the right. There is mild cerebral atrophy. Lack of susceptibility weighted or gradient echo imaging limits assessment for intracranial hemorrhage. There is no midline shift or extra-axial fluid collection. Vascular: Absent flow void in the distal left vertebral artery, unchanged from the prior MRI and which may reflect chronic occlusion or slow flow. Skull and upper cervical spine: Unremarkable bone marrow signal. Sinuses/Orbits: Grossly unchanged 1 cm FLAIR hyperintense nodule at the anterosuperior aspect of the left orbit as described on the prior MRI. Clear sinuses. Other: None. IMPRESSION: 1. Motion degraded, incomplete examination. 2. No acute infarct. 3. Advanced chronic ischemia with multiple old infarcts. Electronically Signed   By: Sebastian Ache M.D.   On: 01/20/2023 09:33   CT HEAD CODE STROKE WO CONTRAST Addendum Date: 01/20/2023 ADDENDUM REPORT: 01/20/2023 08:47 ADDENDUM: Study briefly discussed by telephone with Dr. Hughie Closs on 01/20/2023 at 0840 hours. And these results were communicated to Dr. Iver Nestle at 8:47 am on 01/20/2023 by text page via the [facility] messaging system. Electronically Signed   By: Odessa Fleming M.D.   On: 01/20/2023 08:47   Result Date: 01/20/2023 CLINICAL DATA:  Code stroke. 52 year old female right side numbness, abnormal speech. Last seen well 0720 hours. EXAM: CT HEAD WITHOUT CONTRAST TECHNIQUE: Contiguous axial images were obtained from the base of the skull through the vertex without intravenous contrast. RADIATION DOSE REDUCTION: This exam was performed according to the departmental dose-optimization program which includes automated exposure control, adjustment of the mA and/or kV according to patient size and/or use of iterative reconstruction technique. COMPARISON:  Brain MRI 11/03/2022.  Head CT yesterday. FINDINGS: Brain: Advanced chronic ischemic disease. Chronic encephalomalacia in the left cerebellum PICA territory, bilateral deep gray  nuclei, bilateral corona radiata with some involvement of the body of the corpus callosum, left MCA anterior division. Stable cerebral volume. No midline shift, mass effect, or evidence of intracranial mass lesion. No ventriculomegaly. No acute intracranial hemorrhage identified. No cortically based acute infarct identified. Vascular: Extensive Calcified atherosclerosis at the skull base. No suspicious intracranial vascular hyperdensity. Skull: Stable and intact. Sinuses/Orbits: Visualized paranasal sinuses and mastoids are clear. Other: No gaze deviation, acute orbit or scalp soft tissue finding. ASPECTS [facility] Stroke Program Early CT Score) Total score (0-10 with 10 being normal): 10, chronic encephalomalacia. IMPRESSION: Stable non contrast CT appearance of advanced chronic ischemic disease. No acute cortically based infarct or acute intracranial hemorrhage identified. ASPECTS 10. Electronically Signed: By: Odessa Fleming M.D. On: 01/20/2023 08:35   CT Head Wo Contrast Result Date: 01/19/2023 CLINICAL DATA:  52 year old female with history of altered mental status. EXAM: CT HEAD WITHOUT CONTRAST TECHNIQUE: Contiguous axial images were obtained from the base of the skull through the vertex without intravenous contrast. RADIATION DOSE REDUCTION: This exam was performed according to the departmental dose-optimization program which includes automated exposure control, adjustment of the mA and/or kV according to patient size and/or use of iterative reconstruction technique. COMPARISON:  Head CT 11/22/2022. FINDINGS: Brain: Mild cerebral and cerebellar atrophy. Patchy and confluent areas of decreased attenuation are noted throughout the deep and periventricular white matter of the cerebral hemispheres bilaterally, compatible with chronic microvascular ischemic disease. Multiple more well-defined areas of low attenuation in the basal ganglia bilaterally (right greater than left), similar to prior studies, compatible with  old lacunar infarcts. There is also well-defined low-attenuation in the left frontal lobe and the left cerebellar hemisphere, compatible with areas of encephalomalacia from remote infarcts, similar to the prior study. No evidence of acute infarction, hemorrhage,  hydrocephalus, extra-axial collection or mass lesion/mass effect. Vascular: No hyperdense vessel or unexpected calcification. Skull: Normal. Negative for fracture or focal lesion. Sinuses/Orbits: No acute finding. Other: None. IMPRESSION: 1. No acute intracranial abnormalities. 2. Mild cerebral and cerebellar atrophy with extensive chronic microvascular ischemic changes in the cerebral white matter, old basal ganglia lacunar infarcts bilaterally, and old left frontal and left cerebellar hemisphere infarcts, similar to prior examination. Electronically Signed   By: Trudie Reed M.D.   On: 01/19/2023 12:13   DG Chest Portable 1 View Result Date: 01/19/2023 CLINICAL DATA:  52 year old female with shortness of breath, hypoglycemia, lethargy. EXAM: PORTABLE CHEST 1 VIEW COMPARISON:  Portable chest 12/25/2022 and earlier. FINDINGS: Portable AP semi upright view at 1138 hours. Low lung volumes, similar to prior. Chronic sternotomy. Mediastinal contours remain within normal limits. Visualized tracheal air column is within normal limits. Allowing for portable technique the lungs are clear. Evidence of right lung azygous vein and fissure (normal variant). Negative visible bowel gas. IMPRESSION: Low lung volumes, otherwise no acute cardiopulmonary abnormality. Electronically Signed   By: Odessa Fleming M.D.   On: 01/19/2023 12:09   DG Abd 2 Views Result Date: 01/03/2023 CLINICAL DATA:  Constipation EXAM: ABDOMEN - 2 VIEW COMPARISON:  11/22/2022 FINDINGS: Two supine frontal views of the abdomen and pelvis are obtained. No bowel obstruction or ileus. Minimal stool throughout the colon. No masses or abnormal calcifications. Lung bases are clear. IMPRESSION: 1. No  bowel obstruction or ileus. Minimal retained stool within the colon. Electronically Signed   By: Sharlet Salina M.D.   On: 01/03/2023 12:35    Microbiology: Results for orders placed or performed during the hospital encounter of 01/19/23  Resp panel by RT-PCR (RSV, Flu A&B, Covid) Anterior Nasal Swab     Status: None   Collection Time: 01/19/23  2:20 PM   Specimen: Anterior Nasal Swab  Result Value Ref Range Status   SARS Coronavirus 2 by RT PCR NEGATIVE NEGATIVE Final    Comment: (NOTE) SARS-CoV-2 target nucleic acids are NOT DETECTED.  The SARS-CoV-2 RNA is generally detectable in upper respiratory specimens during the acute phase of infection. The lowest concentration of SARS-CoV-2 viral copies this assay can detect is 138 copies/mL. A negative result does not preclude SARS-Cov-2 infection and should not be used as the sole basis for treatment or other patient management decisions. A negative result may occur with  improper specimen collection/handling, submission of specimen other than nasopharyngeal swab, presence of viral mutation(s) within the areas targeted by this assay, and inadequate number of viral copies(<138 copies/mL). A negative result must be combined with clinical observations, patient history, and epidemiological information. The expected result is Negative.  Fact Sheet for Patients:  BloggerCourse.com  Fact Sheet for Healthcare Providers:  SeriousBroker.it  This test is no t yet approved or cleared by the [place] FDA and  has been authorized for detection and/or diagnosis of SARS-CoV-2 by FDA under an Emergency Use Authorization (EUA). This EUA will remain  in effect (meaning this test can be used) for the duration of the COVID-19 declaration under Section 564(b)(1) of the Act, 21 U.S.C.section 360bbb-3(b)(1), unless the authorization is terminated  or revoked sooner.       Influenza A by PCR NEGATIVE  NEGATIVE Final   Influenza B by PCR NEGATIVE NEGATIVE Final    Comment: (NOTE) The Xpert Xpress SARS-CoV-2/FLU/RSV plus assay is intended as an aid in the diagnosis of influenza from Nasopharyngeal swab specimens and should not be used as  a sole basis for treatment. Nasal washings and aspirates are unacceptable for Xpert Xpress SARS-CoV-2/FLU/RSV testing.  Fact Sheet for Patients: BloggerCourse.com  Fact Sheet for Healthcare Providers: SeriousBroker.it  This test is not yet approved or cleared by the [place] FDA and has been authorized for detection and/or diagnosis of SARS-CoV-2 by FDA under an Emergency Use Authorization (EUA). This EUA will remain in effect (meaning this test can be used) for the duration of the COVID-19 declaration under Section 564(b)(1) of the Act, 21 U.S.C. section 360bbb-3(b)(1), unless the authorization is terminated or revoked.     Resp Syncytial Virus by PCR NEGATIVE NEGATIVE Final    Comment: (NOTE) Fact Sheet for Patients: BloggerCourse.com  Fact Sheet for Healthcare Providers: SeriousBroker.it  This test is not yet approved or cleared by the [place] FDA and has been authorized for detection and/or diagnosis of SARS-CoV-2 by FDA under an Emergency Use Authorization (EUA). This EUA will remain in effect (meaning this test can be used) for the duration of the COVID-19 declaration under Section 564(b)(1) of the Act, 21 U.S.C. section 360bbb-3(b)(1), unless the authorization is terminated or revoked.  Performed at [facility], [address], [place], [place] [address]   MRSA Next Gen by PCR, Nasal     Status: None   Collection Time: 01/19/23  4:17 PM   Specimen: Nasal Mucosa; Nasal Swab  Result Value Ref Range Status   MRSA by PCR Next Gen NOT DETECTED NOT DETECTED Final    Comment: (NOTE) The GeneXpert MRSA Assay  (FDA approved for NASAL specimens only), is one component of a comprehensive MRSA colonization surveillance program. It is not intended to diagnose MRSA infection nor to guide or monitor treatment for MRSA infections. Test performance is not FDA approved in patients less than 7 years old. Performed at [facility], [address], [place], [place] [address]     Labs: CBC: Recent Labs  Lab 01/30/23 1509 01/31/23 0456  WBC 9.0 11.1*  HGB 10.8* 9.5*  HCT 32.3* 29.4*  MCV 74.9* 75.8*  PLT 442* 420*   Basic Metabolic Panel: Recent Labs  Lab 01/30/23 1509 01/31/23 0456  NA 132* 132*  K 4.0 3.8  CL 101 101  CO2 19* 22  GLUCOSE 283* 187*  BUN 29* 21*  CREATININE 1.31* 1.05*  CALCIUM 8.7* 8.6*  MG 2.1  --    Liver Function Tests: Recent Labs  Lab 01/30/23 1509  AST 13*  ALT 15  ALKPHOS 72  BILITOT 0.4  PROT 6.8  ALBUMIN 3.3*   CBG: Recent Labs  Lab 01/30/23 1149 01/31/23 0057 01/31/23 0427 01/31/23 0827 01/31/23 1227  GLUCAP 266* 270* 210* 130* 135*    Discharge time spent: greater than 30 minutes.  Signed: Almon Hercules, MD [facility] 01/31/2023

## 2023-01-31 NOTE — ED Notes (Signed)
[phone] 

## 2023-02-04 ENCOUNTER — Emergency Department (HOSPITAL_COMMUNITY): Payer: 59

## 2023-02-04 ENCOUNTER — Inpatient Hospital Stay (HOSPITAL_COMMUNITY)
Admission: EM | Admit: 2023-02-04 | Discharge: 2023-02-13 | DRG: 377 | Disposition: A | Discharge status: Skilled Nursing Facility | Payer: 59 | Attending: Internal Medicine | Admitting: Internal Medicine

## 2023-02-04 ENCOUNTER — Encounter (HOSPITAL_COMMUNITY): Payer: Self-pay

## 2023-02-04 ENCOUNTER — Other Ambulatory Visit: Payer: Self-pay

## 2023-02-04 DIAGNOSIS — Z881 Allergy status to other antibiotic agents status: Secondary | ICD-10-CM | POA: Diagnosis not present

## 2023-02-04 DIAGNOSIS — K2980 Duodenitis without bleeding: Secondary | ICD-10-CM | POA: Diagnosis not present

## 2023-02-04 DIAGNOSIS — K8689 Other specified diseases of pancreas: Secondary | ICD-10-CM | POA: Diagnosis not present

## 2023-02-04 DIAGNOSIS — R1310 Dysphagia, unspecified: Secondary | ICD-10-CM | POA: Diagnosis not present

## 2023-02-04 DIAGNOSIS — Z825 Family history of asthma and other chronic lower respiratory diseases: Secondary | ICD-10-CM

## 2023-02-04 DIAGNOSIS — K922 Gastrointestinal hemorrhage, unspecified: Secondary | ICD-10-CM | POA: Diagnosis not present

## 2023-02-04 DIAGNOSIS — E7849 Other hyperlipidemia: Secondary | ICD-10-CM | POA: Diagnosis not present

## 2023-02-04 DIAGNOSIS — K317 Polyp of stomach and duodenum: Secondary | ICD-10-CM

## 2023-02-04 DIAGNOSIS — E1143 Type 2 diabetes mellitus with diabetic autonomic (poly)neuropathy: Secondary | ICD-10-CM | POA: Diagnosis present

## 2023-02-04 DIAGNOSIS — Z87891 Personal history of nicotine dependence: Secondary | ICD-10-CM

## 2023-02-04 DIAGNOSIS — R059 Cough, unspecified: Secondary | ICD-10-CM | POA: Diagnosis not present

## 2023-02-04 DIAGNOSIS — I2510 Atherosclerotic heart disease of native coronary artery without angina pectoris: Secondary | ICD-10-CM | POA: Diagnosis present

## 2023-02-04 DIAGNOSIS — K2971 Gastritis, unspecified, with bleeding: Secondary | ICD-10-CM | POA: Diagnosis not present

## 2023-02-04 DIAGNOSIS — F320 Major depressive disorder, single episode, mild: Secondary | ICD-10-CM | POA: Diagnosis not present

## 2023-02-04 DIAGNOSIS — Z8673 Personal history of transient ischemic attack (TIA), and cerebral infarction without residual deficits: Secondary | ICD-10-CM | POA: Diagnosis not present

## 2023-02-04 DIAGNOSIS — F332 Major depressive disorder, recurrent severe without psychotic features: Secondary | ICD-10-CM | POA: Diagnosis not present

## 2023-02-04 DIAGNOSIS — Z91041 Radiographic dye allergy status: Secondary | ICD-10-CM

## 2023-02-04 DIAGNOSIS — I6930 Unspecified sequelae of cerebral infarction: Secondary | ICD-10-CM

## 2023-02-04 DIAGNOSIS — R1084 Generalized abdominal pain: Secondary | ICD-10-CM

## 2023-02-04 DIAGNOSIS — E872 Acidosis, unspecified: Secondary | ICD-10-CM | POA: Diagnosis not present

## 2023-02-04 DIAGNOSIS — D132 Benign neoplasm of duodenum: Secondary | ICD-10-CM

## 2023-02-04 DIAGNOSIS — Z7901 Long term (current) use of anticoagulants: Secondary | ICD-10-CM | POA: Diagnosis not present

## 2023-02-04 DIAGNOSIS — I5032 Chronic diastolic (congestive) heart failure: Secondary | ICD-10-CM | POA: Diagnosis not present

## 2023-02-04 DIAGNOSIS — I110 Hypertensive heart disease with heart failure: Secondary | ICD-10-CM | POA: Diagnosis present

## 2023-02-04 DIAGNOSIS — Z7401 Bed confinement status: Secondary | ICD-10-CM

## 2023-02-04 DIAGNOSIS — R110 Nausea: Secondary | ICD-10-CM | POA: Diagnosis not present

## 2023-02-04 DIAGNOSIS — Z86718 Personal history of other venous thrombosis and embolism: Secondary | ICD-10-CM

## 2023-02-04 DIAGNOSIS — Z6832 Body mass index (BMI) 32.0-32.9, adult: Secondary | ICD-10-CM

## 2023-02-04 DIAGNOSIS — E1169 Type 2 diabetes mellitus with other specified complication: Secondary | ICD-10-CM

## 2023-02-04 DIAGNOSIS — R040 Epistaxis: Secondary | ICD-10-CM

## 2023-02-04 DIAGNOSIS — E860 Dehydration: Secondary | ICD-10-CM | POA: Diagnosis present

## 2023-02-04 DIAGNOSIS — Z951 Presence of aortocoronary bypass graft: Secondary | ICD-10-CM | POA: Diagnosis not present

## 2023-02-04 DIAGNOSIS — K429 Umbilical hernia without obstruction or gangrene: Secondary | ICD-10-CM | POA: Diagnosis not present

## 2023-02-04 DIAGNOSIS — R471 Dysarthria and anarthria: Secondary | ICD-10-CM | POA: Diagnosis not present

## 2023-02-04 DIAGNOSIS — K5711 Diverticulosis of small intestine without perforation or abscess with bleeding: Secondary | ICD-10-CM | POA: Diagnosis not present

## 2023-02-04 DIAGNOSIS — Z794 Long term (current) use of insulin: Secondary | ICD-10-CM | POA: Diagnosis not present

## 2023-02-04 DIAGNOSIS — K209 Esophagitis, unspecified without bleeding: Secondary | ICD-10-CM

## 2023-02-04 DIAGNOSIS — Z886 Allergy status to analgesic agent status: Secondary | ICD-10-CM

## 2023-02-04 DIAGNOSIS — Z79899 Other long term (current) drug therapy: Secondary | ICD-10-CM

## 2023-02-04 DIAGNOSIS — I10 Essential (primary) hypertension: Secondary | ICD-10-CM | POA: Diagnosis present

## 2023-02-04 DIAGNOSIS — G8194 Hemiplegia, unspecified affecting left nondominant side: Secondary | ICD-10-CM | POA: Diagnosis not present

## 2023-02-04 DIAGNOSIS — Z833 Family history of diabetes mellitus: Secondary | ICD-10-CM

## 2023-02-04 DIAGNOSIS — E785 Hyperlipidemia, unspecified: Secondary | ICD-10-CM | POA: Diagnosis not present

## 2023-02-04 DIAGNOSIS — R112 Nausea with vomiting, unspecified: Secondary | ICD-10-CM

## 2023-02-04 DIAGNOSIS — K3184 Gastroparesis: Secondary | ICD-10-CM | POA: Diagnosis present

## 2023-02-04 DIAGNOSIS — R1110 Vomiting, unspecified: Secondary | ICD-10-CM | POA: Diagnosis not present

## 2023-02-04 DIAGNOSIS — E119 Type 2 diabetes mellitus without complications: Secondary | ICD-10-CM

## 2023-02-04 DIAGNOSIS — K219 Gastro-esophageal reflux disease without esophagitis: Secondary | ICD-10-CM | POA: Diagnosis not present

## 2023-02-04 DIAGNOSIS — I69354 Hemiplegia and hemiparesis following cerebral infarction affecting left non-dominant side: Secondary | ICD-10-CM | POA: Diagnosis not present

## 2023-02-04 DIAGNOSIS — R000 Tachycardia, unspecified: Secondary | ICD-10-CM | POA: Diagnosis present

## 2023-02-04 DIAGNOSIS — U071 COVID-19: Secondary | ICD-10-CM

## 2023-02-04 DIAGNOSIS — K449 Diaphragmatic hernia without obstruction or gangrene: Secondary | ICD-10-CM | POA: Diagnosis not present

## 2023-02-04 DIAGNOSIS — K2211 Ulcer of esophagus with bleeding: Secondary | ICD-10-CM | POA: Diagnosis not present

## 2023-02-04 DIAGNOSIS — K5710 Diverticulosis of small intestine without perforation or abscess without bleeding: Secondary | ICD-10-CM | POA: Diagnosis not present

## 2023-02-04 DIAGNOSIS — E0590 Thyrotoxicosis, unspecified without thyrotoxic crisis or storm: Secondary | ICD-10-CM | POA: Diagnosis present

## 2023-02-04 DIAGNOSIS — K5750 Diverticulosis of both small and large intestine without perforation or abscess without bleeding: Secondary | ICD-10-CM | POA: Diagnosis not present

## 2023-02-04 DIAGNOSIS — G4733 Obstructive sleep apnea (adult) (pediatric): Secondary | ICD-10-CM

## 2023-02-04 DIAGNOSIS — I700 Atherosclerosis of aorta: Secondary | ICD-10-CM | POA: Diagnosis not present

## 2023-02-04 DIAGNOSIS — K920 Hematemesis: Secondary | ICD-10-CM | POA: Diagnosis present

## 2023-02-04 DIAGNOSIS — Z832 Family history of diseases of the blood and blood-forming organs and certain disorders involving the immune mechanism: Secondary | ICD-10-CM

## 2023-02-04 DIAGNOSIS — F419 Anxiety disorder, unspecified: Secondary | ICD-10-CM

## 2023-02-04 DIAGNOSIS — Z880 Allergy status to penicillin: Secondary | ICD-10-CM

## 2023-02-04 DIAGNOSIS — D509 Iron deficiency anemia, unspecified: Secondary | ICD-10-CM | POA: Diagnosis not present

## 2023-02-04 DIAGNOSIS — K5909 Other constipation: Secondary | ICD-10-CM | POA: Diagnosis present

## 2023-02-04 DIAGNOSIS — E039 Hypothyroidism, unspecified: Secondary | ICD-10-CM | POA: Diagnosis present

## 2023-02-04 DIAGNOSIS — Z885 Allergy status to narcotic agent status: Secondary | ICD-10-CM

## 2023-02-04 DIAGNOSIS — Z8249 Family history of ischemic heart disease and other diseases of the circulatory system: Secondary | ICD-10-CM

## 2023-02-04 DIAGNOSIS — E66811 Obesity, class 1: Secondary | ICD-10-CM | POA: Diagnosis present

## 2023-02-04 DIAGNOSIS — Z993 Dependence on wheelchair: Secondary | ICD-10-CM

## 2023-02-04 DIAGNOSIS — K3189 Other diseases of stomach and duodenum: Secondary | ICD-10-CM | POA: Diagnosis not present

## 2023-02-04 DIAGNOSIS — G4700 Insomnia, unspecified: Secondary | ICD-10-CM | POA: Diagnosis present

## 2023-02-04 DIAGNOSIS — F32A Depression, unspecified: Secondary | ICD-10-CM | POA: Diagnosis present

## 2023-02-04 DIAGNOSIS — Z7902 Long term (current) use of antithrombotics/antiplatelets: Secondary | ICD-10-CM

## 2023-02-04 DIAGNOSIS — R5381 Other malaise: Secondary | ICD-10-CM | POA: Diagnosis present

## 2023-02-04 LAB — COMPREHENSIVE METABOLIC PANEL
ALT: 16 U/L (ref 0–44)
AST: 17 U/L (ref 15–41)
Albumin: 3.2 g/dL — ABNORMAL LOW (ref 3.5–5.0)
Alkaline Phosphatase: 63 U/L (ref 38–126)
Anion gap: 13 (ref 5–15)
BUN: 11 mg/dL (ref 6–20)
CO2: 21 mmol/L — ABNORMAL LOW (ref 22–32)
Calcium: 8.9 mg/dL (ref 8.9–10.3)
Chloride: 100 mmol/L (ref 98–111)
Creatinine, Ser: 0.96 mg/dL (ref 0.44–1.00)
GFR, Estimated: >60 mL/min (ref >60)
Glucose, Bld: 281 mg/dL — ABNORMAL HIGH (ref 70–99)
Potassium: 3.8 mmol/L (ref 3.5–5.1)
Sodium: 134 mmol/L — ABNORMAL LOW (ref 135–145)
Total Bilirubin: 0.6 mg/dL (ref 0.0–1.2)
Total Protein: 7.3 g/dL (ref 6.5–8.1)

## 2023-02-04 LAB — I-STAT CG4 LACTIC ACID, ED: Lactic Acid, Venous: 0.9 mmol/L (ref 0.5–1.9)

## 2023-02-04 LAB — I-STAT CHEM 8, ED
BUN: 11 mg/dL (ref 6–20)
Calcium, Ion: 1.04 mmol/L — ABNORMAL LOW (ref 1.15–1.40)
Chloride: 101 mmol/L (ref 98–111)
Creatinine, Ser: 0.8 mg/dL (ref 0.44–1.00)
Glucose, Bld: 309 mg/dL — ABNORMAL HIGH (ref 70–99)
HCT: 32 % — ABNORMAL LOW (ref 36.0–46.0)
Hemoglobin: 10.9 g/dL — ABNORMAL LOW (ref 12.0–15.0)
Potassium: 3.5 mmol/L (ref 3.5–5.1)
Sodium: 134 mmol/L — ABNORMAL LOW (ref 135–145)
TCO2: 20 mmol/L — ABNORMAL LOW (ref 22–32)

## 2023-02-04 LAB — CBC WITH DIFFERENTIAL/PLATELET
Abs Immature Granulocytes: 0.03 10*3/uL (ref 0.00–0.07)
Basophils Absolute: 0.1 10*3/uL (ref 0.0–0.1)
Basophils Relative: 1 %
Eosinophils Absolute: 0 10*3/uL (ref 0.0–0.5)
Eosinophils Relative: 0 %
HCT: 32.1 % — ABNORMAL LOW (ref 36.0–46.0)
Hemoglobin: 10.5 g/dL — ABNORMAL LOW (ref 12.0–15.0)
Immature Granulocytes: 0 %
Lymphocytes Relative: 17 %
Lymphs Abs: 1.6 10*3/uL (ref 0.7–4.0)
MCH: 24.6 pg — ABNORMAL LOW (ref 26.0–34.0)
MCHC: 32.7 g/dL (ref 30.0–36.0)
MCV: 75.2 fL — ABNORMAL LOW (ref 80.0–100.0)
Monocytes Absolute: 1 10*3/uL (ref 0.1–1.0)
Monocytes Relative: 10 %
Neutro Abs: 6.6 10*3/uL (ref 1.7–7.7)
Neutrophils Relative %: 72 %
Platelets: 361 10*3/uL (ref 150–400)
RBC: 4.27 MIL/uL (ref 3.87–5.11)
RDW: 17.1 % — ABNORMAL HIGH (ref 11.5–15.5)
WBC: 9.2 10*3/uL (ref 4.0–10.5)
nRBC: 0 % (ref 0.0–0.2)

## 2023-02-04 LAB — I-STAT VENOUS BLOOD GAS, ED
Acid-base deficit: 2 mmol/L (ref 0.0–2.0)
Bicarbonate: 20.1 mmol/L (ref 20.0–28.0)
Calcium, Ion: 1.03 mmol/L — ABNORMAL LOW (ref 1.15–1.40)
HCT: 32 % — ABNORMAL LOW (ref 36.0–46.0)
Hemoglobin: 10.9 g/dL — ABNORMAL LOW (ref 12.0–15.0)
O2 Saturation: 99 %
Potassium: 3.5 mmol/L (ref 3.5–5.1)
Sodium: 134 mmol/L — ABNORMAL LOW (ref 135–145)
TCO2: 21 mmol/L — ABNORMAL LOW (ref 22–32)
pCO2, Ven: 24.3 mm[Hg] — ABNORMAL LOW (ref 44–60)
pH, Ven: 7.525 — ABNORMAL HIGH (ref 7.25–7.43)
pO2, Ven: 126 mm[Hg] — ABNORMAL HIGH (ref 32–45)

## 2023-02-04 LAB — URINALYSIS, ROUTINE W REFLEX MICROSCOPIC
Bilirubin Urine: NEGATIVE
Glucose, UA: 150 mg/dL — AB
Hgb urine dipstick: NEGATIVE
Ketones, ur: 5 mg/dL — AB
Leukocytes,Ua: NEGATIVE
Nitrite: NEGATIVE
Protein, ur: NEGATIVE mg/dL
Specific Gravity, Urine: 1.012 (ref 1.005–1.030)
pH: 5 (ref 5.0–8.0)

## 2023-02-04 LAB — LIPASE, BLOOD: Lipase: 113 U/L — ABNORMAL HIGH (ref 11–51)

## 2023-02-04 LAB — CBG MONITORING, ED
Glucose-Capillary: 234 mg/dL — ABNORMAL HIGH (ref 70–99)
Glucose-Capillary: 195 mg/dL — ABNORMAL HIGH (ref 70–99)
Glucose-Capillary: 122 mg/dL — ABNORMAL HIGH (ref 70–99)

## 2023-02-04 LAB — BETA-HYDROXYBUTYRIC ACID: Beta-Hydroxybutyric Acid: 0.76 mmol/L — ABNORMAL HIGH (ref 0.05–0.27)

## 2023-02-04 MED ORDER — BACLOFEN 10 MG PO TABS
10.0000 mg | ORAL_TABLET | Freq: Three times a day (TID) | ORAL | Status: DC
Start: 2023-02-04 — End: 2023-02-13
  Administered 2023-02-04 – 2023-02-13 (×26): 10 mg via ORAL
  Filled 2023-02-04 (×26): qty 1

## 2023-02-04 MED ORDER — METOPROLOL TARTRATE 50 MG PO TABS
100.0000 mg | ORAL_TABLET | Freq: Two times a day (BID) | ORAL | Status: DC
  Administered 2023-02-04 – 2023-02-13 (×11): 100 mg via ORAL
  Filled 2023-02-04 (×2): qty 2
  Filled 2023-02-04: qty 4
  Filled 2023-02-04 (×7): qty 2
  Filled 2023-02-04: qty 4
  Filled 2023-02-04 (×5): qty 2

## 2023-02-04 MED ORDER — PANTOPRAZOLE SODIUM 40 MG IV SOLR
40.0000 mg | Freq: Two times a day (BID) | INTRAVENOUS | Status: DC
Start: 2023-02-04 — End: 2023-02-08
  Administered 2023-02-04 – 2023-02-08 (×7): 40 mg via INTRAVENOUS
  Filled 2023-02-04 (×7): qty 10

## 2023-02-04 MED ORDER — ALUM & MAG HYDROXIDE-SIMETH 200-200-20 MG/5ML PO SUSP
30.0000 mL | Freq: Once | ORAL | Status: AC
  Administered 2023-02-04: 30 mL via ORAL
  Filled 2023-02-04: qty 30

## 2023-02-04 MED ORDER — INSULIN ASPART 100 UNIT/ML IJ SOLN
0.0000 [IU] | Freq: Three times a day (TID) | INTRAMUSCULAR | Status: DC
  Administered 2023-02-04: 3 [IU] via SUBCUTANEOUS
  Administered 2023-02-06 – 2023-02-08 (×4): 2 [IU] via SUBCUTANEOUS
  Administered 2023-02-10: 8 [IU] via SUBCUTANEOUS

## 2023-02-04 MED ORDER — INSULIN GLARGINE-YFGN 100 UNIT/ML ~~LOC~~ SOLN
10.0000 [IU] | Freq: Every day | SUBCUTANEOUS | Status: DC
  Administered 2023-02-04 – 2023-02-12 (×9): 10 [IU] via SUBCUTANEOUS
  Filled 2023-02-04 (×11): qty 0.1

## 2023-02-04 MED ORDER — RANOLAZINE ER 500 MG PO TB12
500.0000 mg | ORAL_TABLET | Freq: Two times a day (BID) | ORAL | Status: DC
  Administered 2023-02-04 – 2023-02-05 (×2): 500 mg via ORAL
  Filled 2023-02-04 (×2): qty 1

## 2023-02-04 MED ORDER — LORAZEPAM 0.5 MG PO TABS
0.5000 mg | ORAL_TABLET | Freq: Every day | ORAL | Status: DC
  Administered 2023-02-04 – 2023-02-12 (×9): 0.5 mg via ORAL
  Filled 2023-02-04 (×9): qty 1

## 2023-02-04 MED ORDER — GABAPENTIN 100 MG PO CAPS
100.0000 mg | ORAL_CAPSULE | Freq: Three times a day (TID) | ORAL | Status: DC
Start: 2023-02-04 — End: 2023-02-13
  Administered 2023-02-04 – 2023-02-13 (×26): 100 mg via ORAL
  Filled 2023-02-04 (×26): qty 1

## 2023-02-04 MED ORDER — SODIUM CHLORIDE 0.9 % IV SOLN: INTRAVENOUS | Status: DC

## 2023-02-04 MED ORDER — TAMSULOSIN HCL 0.4 MG PO CAPS
0.4000 mg | ORAL_CAPSULE | Freq: Every day | ORAL | Status: DC
  Administered 2023-02-04 – 2023-02-12 (×9): 0.4 mg via ORAL
  Filled 2023-02-04 (×9): qty 1

## 2023-02-04 MED ORDER — TOPIRAMATE 25 MG PO TABS
25.0000 mg | ORAL_TABLET | Freq: Two times a day (BID) | ORAL | Status: DC
  Administered 2023-02-04 – 2023-02-13 (×17): 25 mg via ORAL
  Filled 2023-02-04 (×17): qty 1

## 2023-02-04 MED ORDER — PANTOPRAZOLE SODIUM 40 MG IV SOLR
80.0000 mg | Freq: Once | INTRAVENOUS | Status: AC
  Administered 2023-02-04: 80 mg via INTRAVENOUS
  Filled 2023-02-04: qty 20

## 2023-02-04 MED ORDER — METOCLOPRAMIDE HCL 5 MG/ML IJ SOLN
5.0000 mg | Freq: Three times a day (TID) | INTRAMUSCULAR | Status: DC
  Administered 2023-02-04 – 2023-02-10 (×18): 5 mg via INTRAVENOUS
  Filled 2023-02-04 (×18): qty 2

## 2023-02-04 MED ORDER — EZETIMIBE 10 MG PO TABS
10.0000 mg | ORAL_TABLET | Freq: Every day | ORAL | Status: DC
  Administered 2023-02-04 – 2023-02-13 (×9): 10 mg via ORAL
  Filled 2023-02-04 (×9): qty 1

## 2023-02-04 MED ORDER — METHIMAZOLE 5 MG PO TABS
5.0000 mg | ORAL_TABLET | Freq: Every morning | ORAL | Status: DC
  Administered 2023-02-05 – 2023-02-13 (×8): 5 mg via ORAL
  Filled 2023-02-04 (×9): qty 1

## 2023-02-04 MED ORDER — ONDANSETRON 4 MG PO TBDP
4.0000 mg | ORAL_TABLET | Freq: Once | ORAL | Status: AC
  Administered 2023-02-04: 4 mg via ORAL
  Filled 2023-02-04: qty 1

## 2023-02-04 MED ORDER — SODIUM CHLORIDE 0.9 % IV BOLUS
1000.0000 mL | Freq: Once | INTRAVENOUS | Status: AC
  Administered 2023-02-04: 1000 mL via INTRAVENOUS

## 2023-02-04 MED ORDER — METOPROLOL TARTRATE 25 MG PO TABS: 100.0000 mg | ORAL_TABLET | Freq: Two times a day (BID) | ORAL | Status: DC

## 2023-02-04 MED ORDER — ATORVASTATIN CALCIUM 40 MG PO TABS
80.0000 mg | ORAL_TABLET | Freq: Every day | ORAL | Status: DC
  Administered 2023-02-04 – 2023-02-05 (×2): 80 mg via ORAL
  Filled 2023-02-04 (×2): qty 2

## 2023-02-04 MED ORDER — ESCITALOPRAM OXALATE 20 MG PO TABS
20.0000 mg | ORAL_TABLET | Freq: Every morning | ORAL | Status: DC
  Administered 2023-02-05 – 2023-02-13 (×8): 20 mg via ORAL
  Filled 2023-02-04: qty 1
  Filled 2023-02-04: qty 2
  Filled 2023-02-04 (×6): qty 1

## 2023-02-04 MED ORDER — CYCLOBENZAPRINE HCL 5 MG PO TABS: 5.0000 mg | ORAL_TABLET | Freq: Three times a day (TID) | ORAL | Status: DC | PRN

## 2023-02-04 MED ORDER — ONDANSETRON HCL 4 MG/2ML IJ SOLN
4.0000 mg | Freq: Once | INTRAMUSCULAR | Status: DC
  Filled 2023-02-04: qty 2

## 2023-02-04 MED ORDER — ALBUTEROL SULFATE (2.5 MG/3ML) 0.083% IN NEBU
2.5000 mg | INHALATION_SOLUTION | Freq: Four times a day (QID) | RESPIRATORY_TRACT | Status: DC | PRN
  Filled 2023-02-04: qty 3

## 2023-02-04 MED ORDER — LIDOCAINE VISCOUS HCL 2 % MT SOLN
15.0000 mL | Freq: Once | OROMUCOSAL | Status: AC
  Administered 2023-02-04: 15 mL via ORAL
  Filled 2023-02-04: qty 15

## 2023-02-04 MED ORDER — LACTATED RINGERS IV BOLUS
1000.0000 mL | Freq: Once | INTRAVENOUS | Status: AC
  Administered 2023-02-04: 1000 mL via INTRAVENOUS

## 2023-02-04 NOTE — ED Provider Triage Note (Signed)
 Emergency Medicine Provider Triage Evaluation Note  Lori [name] , a 52 y.o. female  was evaluated in triage.  Pt complains of vomiting coffee-ground emesis, has been vomiting all night long, has had 3 days of upper respiratory illness, grandchildren have been sick around her, presents tachycardic.  Review of Systems  Positive: Vomiting of coffee grounds, coughing, sneezing Negative: Diarrhea, chest pain, fever  Physical Exam  BP 95/61 (BP Location: Left Wrist)   Pulse 92   Temp 99.6 F (37.6 C) (Oral)   Resp 16   Ht 1.575 m (5' 2)   Wt 81.6 kg   SpO2 96%   BMI 32.92 kg/m  Gen:   Awake, no distress tachycardic Resp:  Normal effort no distress with breathing MSK:   Moves extremities without difficulty generally weak but moving all 4 extremities Other:  No edema  Medical Decision Making  Medically screening exam initiated at 7:52 AM.  Appropriate orders placed.  Lori [name] was informed that the remainder of the evaluation will be completed by another provider, this initial triage assessment does not replace that evaluation, and the importance of remaining in the ED until their evaluation is complete.  Patient is tachycardic, vomiting all night, slight hypotension, needs volume and evaluation for causation as well as downstream effects and organ dysfunction   Cleotilde Rogue, MD 02/04/23 [phone]

## 2023-02-04 NOTE — H&P (Signed)
 History and Physical    Lori [name] FMW:[id number] DOB: February 23, 1970 DOA: 02/04/2023  PCP: Leonarda [name], NP  Patient coming from: Home  I have personally briefly reviewed patient's old medical records in [facility]  Chief Complaint: Nausea and vomiting  HPI: Lori [name] is a 52 y.o. [name] with medical history significant of type 2 diabetes mellitus with gastroparesis, CAD status post CABG in [place] , chronic diastolic heart failure, OSA on CPAP, history of CVA with residual left-sided weakness and currently bedbound, history of DVT on Xarelto , pheochromocytoma status post right adrenalectomy, hypothyroidism with multiple recent hospitalizations (admitted 12/26 -12/27/ 2024 for transient acute encephalopathy/left facial droop/aphasia with unremarkable workup with neurology recommendations of discontinuing amlodipine  to avoid hypotension given intracranial stenosis, admission 01/22/2023 -01/23/2023 for acute metabolic encephalopathy felt likely secondary to hyperglycemia and insomnia, acute metabolic encephalopathy, 11/28 -01/04/2023 for gastroparesis )presenting to the ED with 1 day history of nausea and vomiting/coffee-ground emesis with decreased oral intake.  Patient also does endorse 1 months of black stools per her aide, occasional dizziness and lightheadedness, nausea, ongoing heartburn.  Patient denies any fevers, no chills, no chest pain, no shortness of breath, no hematochezia.  Patient does endorse occasional spinning sensation.  Patient denies any prior history of peptic ulcer disease or NSAID use.  ED Course: Patient seen in the ED noted to have another bout of coffee-ground emesis/hematemesis per ED physician.  Patient noted with a sinus tachycardia.  Hemoglobin noted at 10.5 with repeat at 10.9.  Comprehensive metabolic profile with a sodium of 134, bicarb of 21, albumin  of 3.2 otherwise within normal limits.  Lipase level at  113.  Patient given a fluid bolus, PPI 80 mg IV x 1 push, GI consulted, hospitalist called for admission for further evaluation and management.  Review of Systems: As per HPI otherwise all other systems reviewed and are negative.  Past Medical History:  Diagnosis Date   CAD (coronary artery disease)    Depression    Diabetes mellitus without complication (HCC)    History of CT scan    History of mammogram    History of MRI    Hypertension    Pheochromocytoma    Reversible cerebrovascular vasoconstriction syndrome    Stroke (HCC)    Thyroid  disease     Past Surgical History:  Procedure Laterality Date   ABDOMINAL HYSTERECTOMY     CESAREAN SECTION     3   CORONARY ARTERY BYPASS GRAFT     Right adrenal gland removal for pheochromocytoma Right     Social History  reports that she has never smoked. She has never used smokeless tobacco. She reports that she does not currently use alcohol. She reports that she does not use drugs.  Allergies  Allergen Reactions   Dorethia Shim ] Anaphylaxis, Swelling and Other (See Comments)    Throat closes   Contrast Media [Iodinated Contrast Media] Anaphylaxis and Swelling   Imitrex [Sumatriptan] Anaphylaxis   Ms Contin  [Morphine ] Anaphylaxis and Swelling   Penicillins Swelling and Other (See Comments)    Mouth and eyes swell   Firvanq  [Vancomycin ] Other (See Comments)    Red Man's Syndrome   Cipro  [Ciprofloxacin  Hcl] Nausea And Vomiting   Nitrofuran Derivatives Anxiety   Wound Dressing Adhesive Itching and Rash    Family History  Problem Relation Age of Onset   Hypertension Mother    Diabetes Mother    Atrial fibrillation Mother    Hypertension Father    Heart attack  Father    Dementia Father    Diabetes Brother    Brain cancer Brother    Asthma Daughter    Sickle cell anemia Son    Atrial fibrillation Maternal Uncle    Mother alive age 7 with history of diabetes and obesity.  Father alive age 35 with history of  dementia.  Prior to Admission medications   Medication Sig Start Date End Date Taking? Authorizing Provider  albuterol  (PROVENTIL ) (2.5 MG/3ML) 0.083% nebulizer solution Take 3 mLs (2.5 mg total) by nebulization every 6 (six) hours as needed for wheezing or shortness of breath. 01/01/23  Yes Ngetich, Dinah C, NP  albuterol  (VENTOLIN  HFA) 108 (90 Base) MCG/ACT inhaler INHALE 2 PUFFS BY MOUTH EVERY 6 HOURS AS NEEDED FOR WHEEZING AND FOR SHORTNESS OF BREATH Strength: 108 (90 Base) MCG/ACT Patient taking differently: Inhale 2 puffs into the lungs every 6 (six) hours as needed for shortness of breath. 01/01/23  Yes Ngetich, Dinah C, NP  atorvastatin  (LIPITOR ) 80 MG tablet Take 80 mg by mouth daily. 01/28/23  Yes [provider]  baclofen  (LIORESAL ) 10 MG tablet Take 1 tablet (10 mg total) by mouth 3 (three) times daily. 01/08/23  Yes Ngetich, Dinah C, NP  clopidogrel  (PLAVIX ) 75 MG tablet TAKE 1 TABLET (75 MG TOTAL) BY MOUTH DAILY. 01/31/23  Yes Ngetich, Dinah C, NP  cyclobenzaprine  (FLEXERIL ) 5 MG tablet TAKE 1 TABLET BY MOUTH 3 TIMES DAILY AS NEEDED FOR MUSCLE SPASMS Patient taking differently: Take 5 mg by mouth at bedtime. 11/18/22  Yes Lovorn, Megan, MD  diclofenac  Sodium (VOLTAREN ) 1 % GEL Apply 2 g topically 4 (four) times daily.   Yes [provider]  escitalopram  (LEXAPRO ) 20 MG tablet Take 1 tablet (20 mg total) by mouth every morning. 01/01/23  Yes Ngetich, Dinah C, NP  ezetimibe  (ZETIA ) 10 MG tablet TAKE 1 TABLET (10 MG TOTAL) BY MOUTH DAILY. 01/31/23  Yes Ngetich, Dinah C, NP  gabapentin  (NEURONTIN ) 100 MG capsule Take 100 mg by mouth 3 (three) times daily. 11/14/22  Yes [provider]  insulin  glargine (LANTUS  SOLOSTAR) 100 UNIT/ML Solostar Pen Inject 20 Units into the skin at bedtime. Patient taking differently: Inject 22 Units into the skin at bedtime. 01/09/23  Yes Ngetich, Dinah C, NP  insulin  lispro (HUMALOG ) 100 UNIT/ML injection Sliding scale AC, HS CBG  121 - 150: 2 units  CBG 151 - 200: 3 units  CBG 201 - 250: 5 units  CBG 251 - 300: 8 units  CBG 301 - 350: 11 units  CBG 351 - 400: 15 units  CBG > 400: call MD 12/06/22  Yes Cindy Garnette POUR, MD  LORazepam  (ATIVAN ) 0.5 MG tablet Take 1 tablet (0.5 mg total) by mouth at bedtime. 12/06/22  Yes Cindy Garnette POUR, MD  methimazole  (TAPAZOLE ) 5 MG tablet Take 1 tablet (5 mg total) by mouth every morning. 01/01/23  Yes Ngetich, Dinah C, NP  metoCLOPramide  (REGLAN ) 5 MG tablet Take 1 tablet (5 mg total) by mouth 3 (three) times daily before meals. 01/04/23  Yes Danford, Lonni SQUIBB, MD  metoprolol  tartrate (LOPRESSOR ) 100 MG tablet Take 1 tablet (100 mg total) by mouth 2 (two) times daily. 01/08/23  Yes Ngetich, Dinah C, NP  pantoprazole  (PROTONIX ) 40 MG tablet Take 1 tablet (40 mg total) by mouth daily. 02/01/23  Yes Gonfa, Taye T, MD  psyllium (METAMUCIL) 58.6 % powder Take 1 packet by mouth daily.   Yes [provider]  ranolazine  (RANEXA ) 500 MG 12  hr tablet Take 1 tablet (500 mg total) by mouth 2 (two) times daily. 10/09/22 01/23/24 Yes Austria, Eric J, DO  rivaroxaban  (XARELTO ) 10 MG TABS tablet Take 1 tablet (10 mg total) by mouth daily. 01/01/23  Yes Ngetich, Dinah C, NP  tamsulosin  (FLOMAX ) 0.4 MG CAPS capsule Take 0.4 mg by mouth at bedtime. 11/04/22  Yes [provider]  topiramate  (TOPAMAX ) 25 MG tablet Take 1 tablet (25 mg total) by mouth 2 (two) times daily. 07/10/22  Yes Shah, Pratik D, DO  zinc  oxide (BALMEX) 11.3 % CREA cream Apply 1 Application topically 2 (two) times daily.   Yes [provider]  Continuous Glucose Receiver (FREESTYLE LIBRE 2 READER) DEVI 1 Device by Does not apply route daily. E11.69 11/01/22   Ngetich, Dinah C, NP  Continuous Glucose Sensor (FREESTYLE LIBRE 14 DAY SENSOR) MISC 1 each by Does not apply route as needed. DX: E11.69 12/24/22   Ngetich, [name], NP    Physical Exam: Vitals:   02/04/23 1450 02/04/23 1552 02/04/23 1629 02/04/23 1630   BP:  124/80  127/80  Pulse: 94 (!) 123  (!) 123  Resp:  18 (!) 24 (!) 36  Temp:  99.6 F (37.6 C)    TempSrc:  Oral    SpO2: 100% 99%  98%  Weight:      Height:        Constitutional: NAD, calm, comfortable Vitals:   02/04/23 1450 02/04/23 1552 02/04/23 1629 02/04/23 1630  BP:  124/80  127/80  Pulse: 94 (!) 123  (!) 123  Resp:  18 (!) 24 (!) 36  Temp:  99.6 F (37.6 C)    TempSrc:  Oral    SpO2: 100% 99%  98%  Weight:      Height:       Eyes: PERRL, lids and conjunctivae normal ENMT: Mucous membranes are dry. Posterior pharynx clear of any exudate or lesions.Normal dentition.  Brownish coffee-ground discoloration noted on tongue. Neck: normal, supple, no masses, no thyromegaly Respiratory: clear to auscultation bilaterally, no wheezing, no crackles. Normal respiratory effort. No accessory muscle use.  Cardiovascular: Tachycardic.  No pitting lower extremity edema.  2+ pedal pulses.   Abdomen: no tenderness, no masses palpated. No hepatosplenomegaly. Bowel sounds positive.  Musculoskeletal: no clubbing / cyanosis. No joint deformity upper and lower extremities. Good ROM, left upper extremity contracture.  Increased muscle tone in left upper extremity.   Skin: no rashes, lesions, ulcers. No induration Neurologic: CN 2-12 grossly intact. Sensation intact, DTR normal. Strength 5/5 right upper and right lower extremity strength.  3-4/5 left lower extremity strength.  3/5 left upper extremity strength.  Some dysarthric speech. Psychiatric: Normal judgment and insight. Alert and oriented x 3. Normal mood.   Labs on Admission: I have personally reviewed following labs and imaging studies  CBC: Recent Labs  Lab 01/30/23 1509 01/31/23 0456 02/04/23 0758 02/04/23 1112 02/04/23 1113  WBC 9.0 11.1* 9.2  --   --   NEUTROABS  --   --  6.6  --   --   HGB 10.8* 9.5* 10.5* 10.9* 10.9*  HCT 32.3* 29.4* 32.1* 32.0* 32.0*  MCV 74.9* 75.8* 75.2*  --   --   PLT 442* 420* 361  --   --      Basic Metabolic Panel: Recent Labs  Lab 01/30/23 1509 01/31/23 0456 02/04/23 0758 02/04/23 1112 02/04/23 1113  NA 132* 132* 134* 134* 134*  K 4.0 3.8 3.8 3.5 3.5  CL 101 101  100 101  --   CO2 19* 22 21*  --   --   GLUCOSE 283* 187* 281* 309*  --   BUN 29* 21* 11 11  --   CREATININE 1.31* 1.05* 0.96 0.80  --   CALCIUM  8.7* 8.6* 8.9  --   --   MG 2.1  --   --   --   --     GFR: Estimated Creatinine Clearance: 83.3 mL/min (by C-G formula based on SCr of 0.8 mg/dL).  Liver Function Tests: Recent Labs  Lab 01/30/23 1509 02/04/23 0758  AST 13* 17  ALT 15 16  ALKPHOS 72 63  BILITOT 0.4 0.6  PROT 6.8 7.3  ALBUMIN  3.3* 3.2*    Urine analysis:    Component Value Date/Time   COLORURINE YELLOW 01/30/2023 1755   APPEARANCEUR HAZY (A) 01/30/2023 1755   LABSPEC 1.013 01/30/2023 1755   PHURINE 5.0 01/30/2023 1755   GLUCOSEU 50 (A) 01/30/2023 1755   HGBUR NEGATIVE 01/30/2023 1755   BILIRUBINUR NEGATIVE 01/30/2023 1755   KETONESUR NEGATIVE 01/30/2023 1755   PROTEINUR NEGATIVE 01/30/2023 1755   NITRITE NEGATIVE 01/30/2023 1755   LEUKOCYTESUR NEGATIVE 01/30/2023 1755    Radiological Exams on Admission: CT ABDOMEN PELVIS WO CONTRAST Result Date: 02/04/2023 CLINICAL DATA:  Abdominal pain, acute (Ped 0-17y) pancreatitis?/complications//SBO?. EXAM: CT ABDOMEN AND PELVIS WITHOUT CONTRAST TECHNIQUE: Multidetector CT imaging of the abdomen and pelvis was performed following the standard protocol without IV contrast. RADIATION DOSE REDUCTION: This exam was performed according to the departmental dose-optimization program which includes automated exposure control, adjustment of the mA and/or kV according to patient size and/or use of iterative reconstruction technique. COMPARISON:  CT scan abdomen and pelvis from 11/22/2022. FINDINGS: Lower chest: There is small right pleural effusion which is new since the prior study. No left pleural effusion. The visualized lungs are otherwise  clear. The heart is normal in size. No pericardial effusion. There are coronary artery atherosclerotic calcifications, in keeping with coronary artery disease. Hepatobiliary: The liver is normal in size. Non-cirrhotic configuration. No suspicious mass. No intrahepatic or extrahepatic bile duct dilation. Gallbladder is surgically absent. Pancreas: Mild atrophy of the pancreas. There are several dystrophic calcifications throughout the pancreas, nonspecific but commonly seen as a sequela of chronic pancreatitis. No suspicious pancreatic mass seen within the limitations of this unenhanced exam. No peripancreatic fat stranding. Spleen: Within normal limits. No focal lesion. Adrenals/Urinary Tract: Unremarkable left adrenal gland. Right adrenal gland is not well visualized and may have been surgically removed. Correlate with surgical history. No suspicious renal mass within the limitations of this unenhanced exam. No nephroureterolithiasis or obstructive uropathy. Unremarkable urinary bladder. Stomach/Bowel: Redemonstration of a diverticulum arising from the third part of duodenum. No disproportionate dilation of the small or large bowel loops. No evidence of abnormal bowel wall thickening or inflammatory changes. The appendix was not visualized; however there is no acute inflammatory process in the right lower quadrant. There are multiple diverticula throughout the colon, without imaging signs of diverticulitis. Vascular/Lymphatic: No ascites or pneumoperitoneum. No abdominal or pelvic lymphadenopathy, by size criteria. No aneurysmal dilation of the major abdominal arteries. There are marked peripheral atherosclerotic vascular calcifications of the aorta and its major branches. Reproductive: The uterus is surgically absent. No large adnexal mass. Other: There is periumbilical surgical scar in small fat containing hernia. There is also irregular hyperattenuating soft tissue along the lower midline anterior abdominal  wall, similar to the prior study, likely from prior surgery related fibrosis/scarring. No abnormal collection  seen. Musculoskeletal: No suspicious osseous lesions. There are mild multilevel degenerative changes in the visualized spine. IMPRESSION: 1. No acute inflammatory process identified within the abdomen or pelvis. No imaging evidence of acute pancreatitis. No bowel obstruction. 2. New small right pleural effusion. 3. Multiple other nonacute observations, as described above. Aortic Atherosclerosis (ICD10-I70.0). Electronically Signed   By: Ree Molt M.D.   On: 02/04/2023 14:46   DG Chest 1 View Result Date: 02/04/2023 CLINICAL DATA:  Cough and emesis EXAM: CHEST  1 VIEW COMPARISON:  01/31/2023 FINDINGS: Stable cardiomediastinal silhouette. Aortic atherosclerotic calcification. Sternotomy and CABG. No focal consolidation, pleural effusion, or pneumothorax. No displaced rib fractures. IMPRESSION: No active disease. Electronically Signed   By: Norman Gatlin M.D.   On: 02/04/2023 08:56    EKG: Not done  Assessment/Plan Principal Problem:   Hematemesis Active Problems:   Tachycardia   Left hemiplegia (HCC)   Hyperthyroidism   Essential hypertension   Insulin  dependent type 2 diabetes mellitus (HCC)   Chronic diastolic CHF (congestive heart failure) (HCC)   Chronic anticoagulation   OSA on CPAP   Diabetic gastroparesis (HCC)   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   CAD (coronary artery disease)   Depression   Hyperlipidemia   Debility   History of CVA with residual deficit   MDD (major depressive disorder), recurrent episode, severe (HCC)   GERD (gastroesophageal reflux disease)   Wheelchair dependence   Morbid obesity (HCC)   Dehydration   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  Melanotic stools -Patient presented with a 1 day history  of coffee-ground emesis/hematemesis noted to have a bout of hematemesis in the ED per ED physician. -Patient noted to be chronically on Xarelto  and Plavix . -Patient also does state has had a month of black stools per her aide. -Patient denies any prior history of peptic ulcer disease, no history of NSAID use. -Hemoglobin noted at 10.5 with repeat at 10.9. -Patient received a 1 L bolus in the ED will give another liter bolus as patient tachycardic in place on normal saline at 100 cc an hour. -Clear liquid diet. -Patient received Protonix  80 mg IV push x 1 will place on IV PPI 40 mg every 12 hours. -Hold Xarelto  and Plavix  and will defer resumption to GI. -GI consulted and patient for probable upper endoscopy tomorrow patient to be n.p.o. after midnight. -Follow H&H, -Transfusion threshold hemoglobin < 8.  2.  Dehydration -IV fluids.  3.  Hyperthyroidism -Resume home regimen Tapazole  and metoprolol .  4.  Sinus tachycardia -Likely secondary to history of hyperthyroidism patient likely has not received her beta-blocker since presentation. -Resume home regimen Lopressor . -1 L normal saline bolus and placed on IV fluids at 75 cc an hour.  5.  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 -Hold Plavix  and Xarelto . -Resume home regimen statin. -PT/OT/SLP  6.  Diabetes mellitus type 2 with gastroparesis -Hemoglobin A1c 8.5 (11/24/2022) -Placed on decrease/half home dose of long-acting insulin  of Semglee  10 units nightly and placed on sliding scale insulin  as patient to be n.p.o. after midnight. -Place on Reglan  5 mg IV every 8 hours. -Follow.  7.  GERD -PPI.  8.  MDD -Currently stable. -Resume home regimen Lexapro .  9.  CAD/status post CABG -Stable. -Resume home cardiac regimen.  10.  Hyperlipidemia -Continue statin.   DVT prophylaxis: SCDs Code  Status:   Full Family Communication:  Updated patient.  No family at bedside. Disposition  Plan:   Patient is from:  Home  Anticipated DC to:  Likely home  Anticipated DC date:  TBD  Anticipated DC barriers: Clinical improvement  Consults called:  [place] [facility] Admission status:  Place in observation.  Severity of Illness: The appropriate patient status for this patient is OBSERVATION. Observation status is judged to be reasonable and necessary in order to provide the required intensity of service to ensure the patient's safety. The patient's presenting symptoms, physical exam findings, and initial radiographic and laboratory data in the context of their medical condition is felt to place them at decreased risk for further clinical deterioration. Furthermore, it is anticipated that the patient will be medically stable for discharge from the hospital within 2 midnights of admission.     Toribio Hummer MD [facility]  How to contact the [facility] Attending or Consulting provider 7A - 7P or covering provider during after hours 7P -7A, for this patient?   Check the care team in [facility] and look for a) attending/consulting [facility] provider listed and b) the [facility] team listed Log into [email or web address] and use [place]'s universal password to access. If you do not have the password, please contact the hospital operator. Locate the [facility],The provider you are looking for under [facility] and page to a number that you can be directly reached. If you still have difficulty reaching the provider, please page the [facility] (Director on Call) for the Hospitalists listed on amion for assistance.  02/04/2023, 5:00 PM

## 2023-02-04 NOTE — ED Notes (Signed)
 Cardiac monitor admitted to [facility]

## 2023-02-04 NOTE — Consult Note (Signed)
 Consultation Note   Referring Provider:  Emergency Services PCP: Leonarda [name], NP Primary Gastroenterologist: Sampson        Reason for Consultation: Hematemesis  DOA: 02/04/2023         Hospital Day: 1   ASSESSMENT    Brief Narrative:  52 y.o. year old female with multiple medical problems and has had multiple hospitalizations. PMH not limited to DM 2, gastroparesis , CAD s/p CABG on plavix , chronic diastolic heart failure, OSA on CPAP, CVA with residual left-sided weakness, history of DVT on Xarelto  , pheochromocytoma status post right adrenalectomy, bedbound status.   Coffee ground emesis on Plavix  and Xarelto  . Stable.  Hgb stable at 10.9. BUN normal. She is tachycardic in 120's , initial SBP 95 but BP improved to 124/80. Rule out PUD, erosive esophagitis.   Chronic microcytic anemia. Stable Iron  studies in Sept 2024 no really c/w iron  deficiency.   Gastroparesis ( documented), likely 2/2 to diabetes Takes Reglan  at home  GERD, asymptomatic on daily PPI She tells me she takes Omeprazole  but pantoprazole  on home med list   Possible chronic pancreatitis with mild pancreatic atrophy and calcifications throughout pancreas on non-con CT scan.  Chronic intermittent elevation of lipase < 3 x ULN. Today lipase is 113.   Chronic constipation with associated occasional rectal bleeding.  Only partial relief with daily fiber and MiraLAX  as needed.  Previously tried Ex-Lax. Bleeding probably hemorrhoidal  Hyperthyroidism, on Tapazole   Active Problems:   * No active hospital problems. *     PLAN:   --Schedule for EGD to be done tomorrow despite plavix  and xarelto . with Dr. Rollin.  The risks and benefits of EGD with possible biopsies were discussed with the patient who agrees to proceed.  --BID IV PPI --Continue home Reglan  --Monitor H/H --Clear liquids as tolerated, NPO after MN --She has never had a screening colonoscopy.  Can consider this at later date.   HPI   Lori [name] was just discharged from the hospital a few days ago after being admitted for transient acute encephalopathy.  Her workup was unrevealing.  She was found to have intracranial stenosis and neurology discontinued her amlodipine .  Patient is bedbound, she lives with her husband  Lori [name] returns today to ED after onset of coffee ground emesis last night. Emesis described as looking like motor oil. Also, she endorses dark stools over the last month in the absence of bismuth  or iron .  She has intermittent generalized lower abdominal discomfort at times.  This has been more of a chronic problem of a year or so.  No upper abdominal pain.  She denies NSAID use.  She gives a history of GERD, says she takes daily omeprazole  which controls her symptoms.  She denies dysphagia, eats a regular diet.  She gives a history of gastroparesis.  She has never had any sort of colon cancer screening.  She has chronic constipation for which she takes daily fiber and MiraLAX  as needed.  This regimen is not always effective.  She has also tried Ex-Lax.  Sometimes she sees some red blood with bowel movements   Previous GI Evaluations   Nov 2024 Gastric emptying FINDINGS: Expected location of the stomach in the left upper quadrant. Ingested meal  empties the stomach gradually over the course of the study with 67% retention at 60 min and 52% retention at 120 min and 24% retention at 240 minutes (normal retention less than 30% at a 120 min).   IMPRESSION: Delayed gastric emptying.  Labs and Imaging: Recent Labs    02/04/23 0758 02/04/23 1112 02/04/23 1113  WBC 9.2  --   --   HGB 10.5* 10.9* 10.9*  HCT 32.1* 32.0* 32.0*  PLT 361  --   --    Recent Labs    02/04/23 0758 02/04/23 1112 02/04/23 1113  NA 134* 134* 134*  K 3.8 3.5 3.5  CL 100 101  --   CO2 21*  --   --   GLUCOSE 281* 309*  --   BUN 11 11  --   CREATININE 0.96 0.80  --   CALCIUM  8.9  --   --     Recent Labs    02/04/23 0758  PROT 7.3  ALBUMIN  3.2*  AST 17  ALT 16  ALKPHOS 63  BILITOT 0.6   No results for input(s): HEPBSAG, HCVAB, HEPAIGM, HEPBIGM in the last 72 hours. No results for input(s): LABPROT, INR in the last 72 hours.    Past Medical History:  Diagnosis Date   CAD (coronary artery disease)    Depression    Diabetes mellitus without complication (HCC)    History of CT scan    History of mammogram    History of MRI    Hypertension    Pheochromocytoma    Reversible cerebrovascular vasoconstriction syndrome    Stroke (HCC)    Thyroid  disease     Past Surgical History:  Procedure Laterality Date   ABDOMINAL HYSTERECTOMY     CESAREAN SECTION     3   CORONARY ARTERY BYPASS GRAFT     Right adrenal gland removal for pheochromocytoma Right     Family History  Problem Relation Age of Onset   Hypertension Mother    Diabetes Mother    Atrial fibrillation Mother    Hypertension Father    Heart attack Father    Dementia Father    Diabetes Brother    Brain cancer Brother    Asthma Daughter    Sickle cell anemia Son    Atrial fibrillation Maternal Uncle     Prior to Admission medications   Medication Sig Start Date End Date Taking? Authorizing Provider  albuterol  (PROVENTIL ) (2.5 MG/3ML) 0.083% nebulizer solution Take 3 mLs (2.5 mg total) by nebulization every 6 (six) hours as needed for wheezing or shortness of breath. 01/01/23   Ngetich, Dinah C, NP  albuterol  (VENTOLIN  HFA) 108 (90 Base) MCG/ACT inhaler INHALE 2 PUFFS BY MOUTH EVERY 6 HOURS AS NEEDED FOR WHEEZING AND FOR SHORTNESS OF BREATH Strength: 108 (90 Base) MCG/ACT Patient taking differently: Inhale 2 puffs into the lungs every 6 (six) hours as needed for shortness of breath. 01/01/23   Ngetich, Dinah C, NP  atorvastatin  (LIPITOR ) 80 MG tablet Take 80 mg by mouth daily. 01/28/23   [provider]  baclofen  (LIORESAL ) 10 MG tablet Take 1 tablet (10 mg total) by mouth 3  (three) times daily. 01/08/23   Ngetich, Dinah C, NP  bisacodyl  (DULCOLAX) 5 MG EC tablet Take 1 tablet (5 mg total) by mouth daily as needed for moderate constipation. Patient not taking: Reported on 01/31/2023 12/06/22   Cindy Garnette POUR, MD  clopidogrel  (PLAVIX ) 75 MG tablet TAKE 1 TABLET (75 MG TOTAL) BY MOUTH DAILY.  01/31/23   Ngetich, [name], NP  Continuous Glucose Receiver (FREESTYLE LIBRE 2 READER) DEVI 1 Device by Does not apply route daily. E11.69 11/01/22   Ngetich, Dinah C, NP  Continuous Glucose Sensor (FREESTYLE LIBRE 14 DAY SENSOR) MISC 1 each by Does not apply route as needed. DX: E11.69 12/24/22   Ngetich, Dinah C, NP  cyclobenzaprine  (FLEXERIL ) 5 MG tablet TAKE 1 TABLET BY MOUTH 3 TIMES DAILY AS NEEDED FOR MUSCLE SPASMS Patient taking differently: Take 5 mg by mouth at bedtime. 11/18/22   Lovorn, Megan, MD  diclofenac  Sodium (VOLTAREN ) 1 % GEL Apply 2 g topically 4 (four) times daily.    [provider]  escitalopram  (LEXAPRO ) 20 MG tablet Take 1 tablet (20 mg total) by mouth every morning. 01/01/23   Ngetich, Dinah C, NP  ezetimibe  (ZETIA ) 10 MG tablet TAKE 1 TABLET (10 MG TOTAL) BY MOUTH DAILY. 01/31/23   Ngetich, Dinah C, NP  gabapentin  (NEURONTIN ) 100 MG capsule Take 100 mg by mouth 3 (three) times daily. 11/14/22   [provider]  insulin  glargine (LANTUS  SOLOSTAR) 100 UNIT/ML Solostar Pen Inject 20 Units into the skin at bedtime. Patient taking differently: Inject 22 Units into the skin at bedtime. 01/09/23   Ngetich, Dinah C, NP  insulin  lispro (HUMALOG ) 100 UNIT/ML injection Sliding scale AC, HS CBG 121 - 150: 2 units  CBG 151 - 200: 3 units  CBG 201 - 250: 5 units  CBG 251 - 300: 8 units  CBG 301 - 350: 11 units  CBG 351 - 400: 15 units  CBG > 400: call MD 12/06/22   Cindy Garnette POUR, MD  LORazepam  (ATIVAN ) 0.5 MG tablet Take 1 tablet (0.5 mg total) by mouth at bedtime. 12/06/22   Cindy Garnette POUR, MD  methimazole  (TAPAZOLE ) 5 MG tablet Take 1 tablet (5 mg  total) by mouth every morning. 01/01/23   Ngetich, Dinah C, NP  metoCLOPramide  (REGLAN ) 5 MG tablet Take 1 tablet (5 mg total) by mouth 3 (three) times daily before meals. 01/04/23   Danford, Lonni SQUIBB, MD  metoprolol  tartrate (LOPRESSOR ) 100 MG tablet Take 1 tablet (100 mg total) by mouth 2 (two) times daily. 01/08/23   Ngetich, Dinah C, NP  pantoprazole  (PROTONIX ) 40 MG tablet Take 1 tablet (40 mg total) by mouth daily. 02/01/23   Gonfa, Taye T, MD  polyethylene glycol powder (GLYCOLAX /MIRALAX ) 17 GM/SCOOP powder Take 17 g by mouth daily. Hold for loose stool 01/01/23   Ngetich, Dinah C, NP  ranolazine  (RANEXA ) 500 MG 12 hr tablet Take 1 tablet (500 mg total) by mouth 2 (two) times daily. 10/09/22 01/23/24  [place], Eric J, DO  rivaroxaban  (XARELTO ) 10 MG TABS tablet Take 1 tablet (10 mg total) by mouth daily. 01/01/23   Ngetich, Dinah C, NP  tamsulosin  (FLOMAX ) 0.4 MG CAPS capsule Take 0.4 mg by mouth at bedtime. 11/04/22   [provider]  topiramate  (TOPAMAX ) 25 MG tablet Take 1 tablet (25 mg total) by mouth 2 (two) times daily. 07/10/22   Maree, Pratik D, DO  zinc  oxide (BALMEX) 11.3 % CREA cream Apply 1 Application topically 2 (two) times daily.    [provider]    Current Facility-Administered Medications  Medication Dose Route Frequency Provider Last Rate Last Admin   0.9 %  sodium chloride  infusion   Intravenous Continuous Cleotilde Rogue, MD       ondansetron  (ZOFRAN ) injection 4 mg  4 mg Intravenous Once Cleotilde Rogue, MD  Current Outpatient Medications  Medication Sig Dispense Refill   albuterol  (PROVENTIL ) (2.5 MG/3ML) 0.083% nebulizer solution Take 3 mLs (2.5 mg total) by nebulization every 6 (six) hours as needed for wheezing or shortness of breath. 75 mL 5   albuterol  (VENTOLIN  HFA) 108 (90 Base) MCG/ACT inhaler INHALE 2 PUFFS BY MOUTH EVERY 6 HOURS AS NEEDED FOR WHEEZING AND FOR SHORTNESS OF BREATH Strength: 108 (90 Base) MCG/ACT (Patient taking differently:  Inhale 2 puffs into the lungs every 6 (six) hours as needed for shortness of breath.) 6.7 g 5   atorvastatin  (LIPITOR ) 80 MG tablet Take 80 mg by mouth daily.     baclofen  (LIORESAL ) 10 MG tablet Take 1 tablet (10 mg total) by mouth 3 (three) times daily. 90 tablet 1   bisacodyl  (DULCOLAX) 5 MG EC tablet Take 1 tablet (5 mg total) by mouth daily as needed for moderate constipation. (Patient not taking: Reported on 01/31/2023)     clopidogrel  (PLAVIX ) 75 MG tablet TAKE 1 TABLET (75 MG TOTAL) BY MOUTH DAILY. 90 tablet 3   Continuous Glucose Receiver (FREESTYLE LIBRE 2 READER) DEVI 1 Device by Does not apply route daily. E11.69 1 each 11   Continuous Glucose Sensor (FREESTYLE LIBRE 14 DAY SENSOR) MISC 1 each by Does not apply route as needed. DX: E11.69 2 each 12   cyclobenzaprine  (FLEXERIL ) 5 MG tablet TAKE 1 TABLET BY MOUTH 3 TIMES DAILY AS NEEDED FOR MUSCLE SPASMS (Patient taking differently: Take 5 mg by mouth at bedtime.) 90 tablet 10   diclofenac  Sodium (VOLTAREN ) 1 % GEL Apply 2 g topically 4 (four) times daily.     escitalopram  (LEXAPRO ) 20 MG tablet Take 1 tablet (20 mg total) by mouth every morning. 90 tablet 1   ezetimibe  (ZETIA ) 10 MG tablet TAKE 1 TABLET (10 MG TOTAL) BY MOUTH DAILY. 90 tablet 3   gabapentin  (NEURONTIN ) 100 MG capsule Take 100 mg by mouth 3 (three) times daily.     insulin  glargine (LANTUS  SOLOSTAR) 100 UNIT/ML Solostar Pen Inject 20 Units into the skin at bedtime. (Patient taking differently: Inject 22 Units into the skin at bedtime.)     insulin  lispro (HUMALOG ) 100 UNIT/ML injection Sliding scale AC, HS CBG 121 - 150: 2 units  CBG 151 - 200: 3 units  CBG 201 - 250: 5 units  CBG 251 - 300: 8 units  CBG 301 - 350: 11 units  CBG 351 - 400: 15 units  CBG > 400: call MD     LORazepam  (ATIVAN ) 0.5 MG tablet Take 1 tablet (0.5 mg total) by mouth at bedtime. 5 tablet 0   methimazole  (TAPAZOLE ) 5 MG tablet Take 1 tablet (5 mg total) by mouth every morning. 90 tablet 1    metoCLOPramide  (REGLAN ) 5 MG tablet Take 1 tablet (5 mg total) by mouth 3 (three) times daily before meals. 90 tablet 2   metoprolol  tartrate (LOPRESSOR ) 100 MG tablet Take 1 tablet (100 mg total) by mouth 2 (two) times daily. 90 tablet 1   pantoprazole  (PROTONIX ) 40 MG tablet Take 1 tablet (40 mg total) by mouth daily. 30 tablet 3   polyethylene glycol powder (GLYCOLAX /MIRALAX ) 17 GM/SCOOP powder Take 17 g by mouth daily. Hold for loose stool 765 g 3   ranolazine  (RANEXA ) 500 MG 12 hr tablet Take 1 tablet (500 mg total) by mouth 2 (two) times daily. 180 tablet 0   rivaroxaban  (XARELTO ) 10 MG TABS tablet Take 1 tablet (10 mg total) by mouth daily. 90 tablet  1   tamsulosin  (FLOMAX ) 0.4 MG CAPS capsule Take 0.4 mg by mouth at bedtime.     topiramate  (TOPAMAX ) 25 MG tablet Take 1 tablet (25 mg total) by mouth 2 (two) times daily. 10 tablet 0   zinc  oxide (BALMEX) 11.3 % CREA cream Apply 1 Application topically 2 (two) times daily.      Allergies as of 02/04/2023 - Reviewed 02/04/2023  Allergen Reaction Noted   Asa [aspirin ] Anaphylaxis, Swelling, and Other (See Comments) 01/23/2022   Contrast media [iodinated contrast media] Anaphylaxis and Swelling 01/20/2021   Imitrex [sumatriptan] Anaphylaxis 01/19/2021   Ms contin  [morphine ] Anaphylaxis and Swelling 11/02/2021   Penicillins Swelling and Other (See Comments) 01/19/2021   Firvanq  [vancomycin ] Other (See Comments) 07/04/2022   Cipro  [ciprofloxacin  hcl] Nausea And Vomiting    Nitrofuran derivatives Anxiety 12/30/2022   Wound dressing adhesive Itching and Rash 11/04/2022    Social History   Socioeconomic History   Marital status: Married    Spouse name: Not on file   Number of children: Not on file   Years of education: Not on file   Highest education level: Not on file  Occupational History   Not on file  Tobacco Use   Smoking status: Never   Smokeless tobacco: Never  Vaping Use   Vaping status: Never Used  Substance and Sexual  Activity   Alcohol use: Not Currently   Drug use: Never   Sexual activity: Not on file  Other Topics Concern   Not on file  Social History Narrative   Tobacco use, amount per day now: Never   Past tobacco use, amount per day: Never   How many years did you use tobacco: Never   Alcohol use (drinks per week): Not Currently.   Diet: Eat out a lot.    Do you drink/eat things with caffeine : Sweet Tea   Marital status:   Married                               What year were you married? 2000   Do you live in a house, apartment, assisted living, condo, trailer, etc.? House   Is it one or more stories? 1   How many persons live in your home? 2   Do you have pets in your home?( please list) No   Highest Level of education completed? Bachelors Degree.   Current or past profession: Solicitor, [employer]   Do you exercise?   No                               Type and how often?   Do you have a living will? No   Do you have a DNR form?       No                            If not, do you want to discuss one?   Do you have signed POA/HPOA forms?   No                     If so, please bring to you appointment      Do you have any difficulty bathing or dressing yourself? Yes   Do you have any difficulty preparing food or eating? Yes   Do  you have any difficulty managing your medications? Yes   Do you have any difficulty managing your finances? No   Do you have any difficulty affording your medications? Yes   Social Drivers of Corporate Investment Banker Strain: Not on file  Food Insecurity: No Food Insecurity (01/19/2023)   Hunger Vital Sign    Worried About Running Out of Food in the Last Year: Never true    Ran Out of Food in the Last Year: Never true  Transportation Needs: No Transportation Needs (01/19/2023)   PRAPARE - Administrator, Civil Service (Medical): No    Lack of Transportation (Non-Medical): No  Physical Activity: Not on  file  Stress: Not on file  Social Connections: Not on file  Intimate Partner Violence: Not At Risk (01/19/2023)   Humiliation, Afraid, Rape, and Kick questionnaire    Fear of Current or Ex-Partner: No    Emotionally Abused: No    Physically Abused: No    Sexually Abused: No     Code Status   Code Status: Prior  Review of Systems: All systems reviewed and negative except where noted in HPI.  Physical Exam: Vital signs in last 24 hours: Temp:  [99.6 F (37.6 C)] 99.6 F (37.6 C) (12/31 0739) Pulse Rate:  [92-94] 94 (12/31 1450) Resp:  [16] 16 (12/31 0739) BP: (95)/(61) 95/61 (12/31 0739) SpO2:  [96 %-100 %] 100 % (12/31 1450) Weight:  [81.6 kg] 81.6 kg (12/31 0734)    General:  Pleasant female in NAD Psych:  Cooperative. Normal mood and affect Eyes: Pupils equal Ears:  Normal auditory acuity Nose: No deformity, discharge or lesions Neck:  Supple, no masses felt Lungs:  Clear to auscultation.  Heart:  Regular rate, regular rhythm.  Abdomen:  Soft, nondistended, nontender, active bowel sounds, no masses felt Rectal :  Deferred Msk: Symmetrical without gross deformities.  Neurologic:  Alert, oriented, grossly normal neurologically Skin:  Intact without significant lesions.    Intake/Output from previous day: No intake/output data recorded. Intake/Output this shift:  No intake/output data recorded.   Vina Dasen, NP-C   02/04/2023, 3:41 PM

## 2023-02-04 NOTE — ED Notes (Signed)
Patient with one episode of coffee ground emesis

## 2023-02-04 NOTE — ED Provider Notes (Signed)
 [facility] Provider Note   CSN: [id number] Arrival date & time: 02/04/23  9286     History  Chief Complaint  Patient presents with   Emesis    Vomitting brown emesis since 10 pm, constipated for 3 days.    Lori [name] is a 52 y.o. female.  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  Reports 9 episodes of emesis since late last night.  Emesis became brown in color. Has been constipated.  Abdominal pain is diffuse, worse in epigastrium, more of a sharp pain than usual.  No fever.  No hx of GI bleed. Does not have gastroenterogist. Home Medications Prior to Admission medications   Medication Sig Start Date End Date Taking? Authorizing Provider  albuterol  (PROVENTIL ) (2.5 MG/3ML) 0.083% nebulizer solution Take 3 mLs (2.5 mg total) by nebulization every 6 (six) hours as needed for wheezing or shortness of breath. 01/01/23  Yes Ngetich, Dinah C, NP  albuterol  (VENTOLIN  HFA) 108 (90 Base) MCG/ACT inhaler INHALE 2 PUFFS BY MOUTH EVERY 6 HOURS AS NEEDED FOR WHEEZING AND FOR SHORTNESS OF BREATH Strength: 108 (90 Base) MCG/ACT Patient taking differently: Inhale 2 puffs into the lungs every 6 (six) hours as needed for shortness of breath. 01/01/23  Yes Ngetich, Dinah C, NP  atorvastatin  (LIPITOR ) 80 MG  tablet Take 80 mg by mouth daily. 01/28/23  Yes [provider]  baclofen  (LIORESAL ) 10 MG tablet Take 1 tablet (10 mg total) by mouth 3 (three) times daily. 01/08/23  Yes Ngetich, Dinah C, NP  clopidogrel  (PLAVIX ) 75 MG tablet TAKE 1 TABLET (75 MG TOTAL) BY MOUTH DAILY. 01/31/23  Yes Ngetich, Dinah C, NP  cyclobenzaprine  (FLEXERIL ) 5 MG tablet TAKE 1 TABLET BY MOUTH 3 TIMES DAILY AS NEEDED FOR MUSCLE SPASMS Patient taking differently: Take 5 mg by mouth at bedtime. 11/18/22  Yes Lovorn, Megan, MD  diclofenac  Sodium (VOLTAREN ) 1 % GEL Apply 2 g topically 4 (four) times daily.   Yes [provider]  escitalopram  (LEXAPRO ) 20 MG tablet Take 1 tablet (20 mg total) by mouth every morning. 01/01/23  Yes Ngetich, Dinah C, NP  ezetimibe  (ZETIA ) 10 MG tablet TAKE 1 TABLET (10 MG TOTAL) BY MOUTH DAILY. 01/31/23  Yes Ngetich, Dinah C, NP  gabapentin  (NEURONTIN ) 100 MG capsule Take 100 mg by mouth 3 (three) times daily. 11/14/22  Yes [provider]  insulin  glargine (LANTUS  SOLOSTAR) 100 UNIT/ML Solostar Pen Inject 20 Units into the skin at bedtime. Patient taking differently: Inject 22 Units into the skin at bedtime. 01/09/23  Yes Ngetich, Dinah C, NP  insulin  lispro (HUMALOG ) 100 UNIT/ML injection Sliding scale AC, HS CBG 121 - 150: 2 units  CBG 151 - 200: 3 units  CBG 201 - 250: 5 units  CBG 251 - 300: 8 units  CBG 301 - 350: 11 units  CBG 351 - 400: 15 units  CBG > 400: call MD 12/06/22  Yes Cindy Garnette POUR, MD  LORazepam  (ATIVAN )  0.5 MG tablet Take 1 tablet (0.5 mg total) by mouth at bedtime. 12/06/22  Yes Cindy Garnette POUR, MD  methimazole  (TAPAZOLE ) 5 MG tablet Take 1 tablet (5 mg total) by mouth every morning. 01/01/23  Yes Ngetich, Dinah C, NP  metoCLOPramide  (REGLAN ) 5 MG tablet Take 1 tablet (5 mg total) by mouth 3 (three) times daily before meals. 01/04/23  Yes Danford, Lonni SQUIBB, MD  metoprolol  tartrate (LOPRESSOR ) 100 MG tablet Take 1 tablet (100 mg total) by mouth  2 (two) times daily. 01/08/23  Yes Ngetich, Dinah C, NP  pantoprazole  (PROTONIX ) 40 MG tablet Take 1 tablet (40 mg total) by mouth daily. 02/01/23  Yes Gonfa, Taye T, MD  psyllium (METAMUCIL) 58.6 % powder Take 1 packet by mouth daily.   Yes [provider]  ranolazine  (RANEXA ) 500 MG 12 hr tablet Take 1 tablet (500 mg total) by mouth 2 (two) times daily. 10/09/22 01/23/24 Yes Austria, Camellia PARAS, DO  rivaroxaban  (XARELTO ) 10 MG TABS tablet Take 1 tablet (10 mg total) by mouth daily. 01/01/23  Yes Ngetich, Dinah C, NP  tamsulosin  (FLOMAX ) 0.4 MG CAPS capsule Take 0.4 mg by mouth at bedtime. 11/04/22  Yes [provider]  topiramate  (TOPAMAX ) 25 MG tablet Take 1 tablet (25 mg total) by mouth 2 (two) times daily. 07/10/22  Yes Shah, Pratik D, DO  zinc  oxide (BALMEX) 11.3 % CREA cream Apply 1 Application topically 2 (two) times daily.   Yes [provider]  Continuous Glucose Receiver (FREESTYLE LIBRE 2 READER) DEVI 1 Device by Does not apply route daily. E11.69 11/01/22   Ngetich, Dinah C, NP  Continuous Glucose Sensor (FREESTYLE LIBRE 14 DAY SENSOR) MISC 1 each by Does not apply route as needed. DX: E11.69 12/24/22   Ngetich, [name], NP      Allergies    Dorethia Bene ], Contrast media [iodinated contrast media], Imitrex [sumatriptan], Ms contin  [morphine ], Penicillins, Firvanq  [vancomycin ], Cipro  [ciprofloxacin  hcl], Nitrofuran derivatives, and Wound dressing adhesive    Review of Systems   Review of Systems  Physical Exam Updated Vital Signs BP (!) 129/91 (BP Location: Right Arm)   Pulse 98   Temp 99.6 F (37.6 C) (Oral)   Resp (!) 33   Ht 5' 2 (1.575 m)   Wt 81.6 kg   SpO2 97%   BMI 32.92 kg/m  Physical Exam Vitals and nursing note reviewed.  Constitutional:      General: She is not in acute distress.    Appearance: She is well-developed. She is not diaphoretic.  HENT:     Head: Normocephalic and atraumatic.  Eyes:     Conjunctiva/sclera: Conjunctivae normal.   Cardiovascular:     Rate and Rhythm: Normal rate and regular rhythm.     Heart sounds: Normal heart sounds. No murmur heard.    No friction rub. No gallop.  Pulmonary:     Effort: Pulmonary effort is normal. No respiratory distress.     Breath sounds: Normal breath sounds. No wheezing or rales.  Abdominal:     General: There is no distension.     Palpations: Abdomen is soft.     Tenderness: There is abdominal tenderness. There is no guarding.  Musculoskeletal:        General: No tenderness.     Cervical back: Normal range of motion.  Skin:    General: Skin is warm and dry.     Findings: No erythema or rash.  Neurological:     Mental Status: She is  alert and oriented to person, place, and time.     ED Results / Procedures / Treatments   Labs (all labs ordered are listed, but only abnormal results are displayed) Labs Reviewed  CBC WITH DIFFERENTIAL/PLATELET - Abnormal; Notable for the following components:      Result Value   Hemoglobin 10.5 (*)    HCT 32.1 (*)    MCV 75.2 (*)    MCH 24.6 (*)    RDW 17.1 (*)    All other components within normal limits  COMPREHENSIVE METABOLIC PANEL - Abnormal; Notable for the following components:   Sodium 134 (*)    CO2 21 (*)    Glucose, Bld 281 (*)    Albumin  3.2 (*)    All other components within normal limits  LIPASE, BLOOD - Abnormal; Notable for the following components:   Lipase 113 (*)    All other components within normal limits  URINALYSIS, ROUTINE W REFLEX MICROSCOPIC - Abnormal; Notable for the following components:   APPearance HAZY (*)    Glucose, UA 150 (*)    Ketones, ur 5 (*)    All other components within normal limits  BETA-HYDROXYBUTYRIC ACID - Abnormal; Notable for the following components:   Beta-Hydroxybutyric Acid 0.76 (*)    All other components within normal limits  I-STAT VENOUS BLOOD GAS, ED - Abnormal; Notable for the following components:   pH, Ven 7.525 (*)    pCO2, Ven 24.3 (*)    pO2, Ven 126  (*)    TCO2 21 (*)    Sodium 134 (*)    Calcium , Ion 1.03 (*)    HCT 32.0 (*)    Hemoglobin 10.9 (*)    All other components within normal limits  I-STAT CHEM 8, ED - Abnormal; Notable for the following components:   Sodium 134 (*)    Glucose, Bld 309 (*)    Calcium , Ion 1.04 (*)    TCO2 20 (*)    Hemoglobin 10.9 (*)    HCT 32.0 (*)    All other components within normal limits  CBG MONITORING, ED - Abnormal; Notable for the following components:   Glucose-Capillary 234 (*)    All other components within normal limits  CBG MONITORING, ED - Abnormal; Notable for the following components:   Glucose-Capillary 195 (*)    All other components within normal limits  RESP PANEL BY RT-PCR (RSV, FLU A&B, COVID)  [id number]  CBC  I-STAT CG4 LACTIC ACID, ED  POC OCCULT BLOOD, ED  TYPE AND SCREEN    EKG None  Radiology CT ABDOMEN PELVIS WO CONTRAST Result Date: 02/04/2023 CLINICAL DATA:  Abdominal pain, acute (Ped 0-17y) pancreatitis?/complications//SBO?. EXAM: CT ABDOMEN AND PELVIS WITHOUT CONTRAST TECHNIQUE: Multidetector CT imaging of the abdomen and pelvis was performed following the standard protocol without IV contrast. RADIATION DOSE REDUCTION: This exam was performed according to the departmental dose-optimization program which includes automated exposure control, adjustment of the mA and/or kV according to patient size and/or use of iterative reconstruction technique. COMPARISON:  CT scan abdomen and pelvis from 11/22/2022. FINDINGS: Lower chest: There is small right pleural effusion which is new since the prior study. No left pleural effusion. The visualized lungs are otherwise clear. The heart is normal in size. No pericardial effusion. There are coronary artery atherosclerotic calcifications, in keeping with coronary artery disease. Hepatobiliary: The liver is normal in size. Non-cirrhotic configuration. No suspicious mass. No intrahepatic or extrahepatic bile duct dilation. Gallbladder  is surgically absent. Pancreas: Mild atrophy  of the pancreas. There are several dystrophic calcifications throughout the pancreas, nonspecific but commonly seen as a sequela of chronic pancreatitis. No suspicious pancreatic mass seen within the limitations of this unenhanced exam. No peripancreatic fat stranding. Spleen: Within normal limits. No focal lesion. Adrenals/Urinary Tract: Unremarkable left adrenal gland. Right adrenal gland is not well visualized and may have been surgically removed. Correlate with surgical history. No suspicious renal mass within the limitations of this unenhanced exam. No nephroureterolithiasis or obstructive uropathy. Unremarkable urinary bladder. Stomach/Bowel: Redemonstration of a diverticulum arising from the third part of duodenum. No disproportionate dilation of the small or large bowel loops. No evidence of abnormal bowel wall thickening or inflammatory changes. The appendix was not visualized; however there is no acute inflammatory process in the right lower quadrant. There are multiple diverticula throughout the colon, without imaging signs of diverticulitis. Vascular/Lymphatic: No ascites or pneumoperitoneum. No abdominal or pelvic lymphadenopathy, by size criteria. No aneurysmal dilation of the major abdominal arteries. There are marked peripheral atherosclerotic vascular calcifications of the aorta and its major branches. Reproductive: The uterus is surgically absent. No large adnexal mass. Other: There is periumbilical surgical scar in small fat containing hernia. There is also irregular hyperattenuating soft tissue along the lower midline anterior abdominal wall, similar to the prior study, likely from prior surgery related fibrosis/scarring. No abnormal collection seen. Musculoskeletal: No suspicious osseous lesions. There are mild multilevel degenerative changes in the visualized spine. IMPRESSION: 1. No acute inflammatory process identified within the abdomen or pelvis.  No imaging evidence of acute pancreatitis. No bowel obstruction. 2. New small right pleural effusion. 3. Multiple other nonacute observations, as described above. Aortic Atherosclerosis (ICD10-I70.0). Electronically Signed   By: Ree Molt M.D.   On: 02/04/2023 14:46   DG Chest 1 View Result Date: 02/04/2023 CLINICAL DATA:  Cough and emesis EXAM: CHEST  1 VIEW COMPARISON:  01/31/2023 FINDINGS: Stable cardiomediastinal silhouette. Aortic atherosclerotic calcification. Sternotomy and CABG. No focal consolidation, pleural effusion, or pneumothorax. No displaced rib fractures. IMPRESSION: No active disease. Electronically Signed   By: Norman Gatlin M.D.   On: 02/04/2023 08:56    Procedures Procedures    Medications Ordered in ED Medications  ondansetron  (ZOFRAN ) injection 4 mg (4 mg Intravenous Not Given 02/04/23 1135)  0.9 %  sodium chloride  infusion ( Intravenous New Bag/Given 02/04/23 1752)  pantoprazole  (PROTONIX ) injection 40 mg (has no administration in time range)  albuterol  (PROVENTIL ) (2.5 MG/3ML) 0.083% nebulizer solution 2.5 mg (has no administration in time range)  atorvastatin  (LIPITOR ) tablet 80 mg (80 mg Oral Given 02/04/23 1758)  baclofen  (LIORESAL ) tablet 10 mg (10 mg Oral Given 02/04/23 1758)  cyclobenzaprine  (FLEXERIL ) tablet 5 mg (has no administration in time range)  escitalopram  (LEXAPRO ) tablet 20 mg (has no administration in time range)  ezetimibe  (ZETIA ) tablet 10 mg (10 mg Oral Given 02/04/23 1757)  gabapentin  (NEURONTIN ) capsule 100 mg (100 mg Oral Given 02/04/23 1758)  LORazepam  (ATIVAN ) tablet 0.5 mg (has no administration in time range)  methimazole  (TAPAZOLE ) tablet 5 mg (has no administration in time range)  ranolazine  (RANEXA ) 12 hr tablet 500 mg (has no administration in time range)  tamsulosin  (FLOMAX ) capsule 0.4 mg (has no administration in time range)  topiramate  (TOPAMAX ) tablet 25 mg (has no administration in time range)  metoprolol  tartrate  (LOPRESSOR ) tablet 100 mg (100 mg Oral Given 02/04/23 1757)  insulin  glargine-yfgn (SEMGLEE ) injection 10 Units (has no administration in time range)  insulin  aspart (novoLOG ) injection 0-15 Units (3 Units Subcutaneous  Given 02/04/23 1754)  metoCLOPramide  (REGLAN ) injection 5 mg (5 mg Intravenous Given 02/04/23 1755)  ondansetron  (ZOFRAN -ODT) disintegrating tablet 4 mg (4 mg Oral Given 02/04/23 1137)  pantoprazole  (PROTONIX ) injection 80 mg (80 mg Intravenous Given 02/04/23 1505)  lactated ringers  bolus 1,000 mL (0 mLs Intravenous Stopped 02/04/23 1657)  sodium chloride  0.9 % bolus 1,000 mL (0 mLs Intravenous Stopped 02/04/23 1831)  alum & mag hydroxide-simeth (MAALOX/MYLANTA) 200-200-20 MG/5ML suspension 30 mL (30 mLs Oral Given 02/04/23 1759)    And  lidocaine  (XYLOCAINE ) 2 % viscous mouth solution 15 mL (15 mLs Oral Given 02/04/23 1758)    ED Course/ Medical Decision Making/ A&P                52 year old female with a history of CVA, 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  DDx includes appendicitis, pancreatitis, cholecystitis, pyelonephritis, nephrolithiasis, diverticulitis, SBO, DKA, PUD, gastroparesis, gastritis.   Labs completed and personally evaluated by me show mild lipase elevation, mild bicarb decrease, stable anemia, no leuokcytosis, normal beta hydroxybutyric acid, normal lactic acid, mild respiratory  alkalosis.    CT completed given abdominal pain, lipase elevation shows no evidence of SBO, nor pancreatitis nor acute abnormalities.  During observation had episode of coffee ground emesis in the ED.  Blood pressures stable. HR increased on recheck. Given zofran , 80mg  protonix  bolus.  Discussed with Dr. Leigh of [facility].  Is on xarelto  and plavix .  Will admit for continued care with concern for upper GI bleed.         Final Clinical Impression(s) / ED Diagnoses Final diagnoses:  Gastrointestinal hemorrhage, unspecified gastrointestinal hemorrhage type  Generalized abdominal pain  Hematemesis with nausea    Rx / DC Orders ED Discharge Orders     None         Dreama Longs, MD 02/04/23 2150

## 2023-02-04 NOTE — ED Triage Notes (Signed)
 Nausea vomiting since 10 pm, dark brown, no texture previous gi bleed.  Constipated for 3x days. Ate/drank but couldn't keep down.  Refused I'm medicaiont ie zofran .  Poor iv access according to ems.  Stable vitals except tacky to 120s, oriented x4.  Recent hospitalization.

## 2023-02-04 NOTE — ED Notes (Signed)
Incontinence care provided

## 2023-02-05 DIAGNOSIS — E1169 Type 2 diabetes mellitus with other specified complication: Secondary | ICD-10-CM | POA: Diagnosis not present

## 2023-02-05 DIAGNOSIS — K2980 Duodenitis without bleeding: Secondary | ICD-10-CM | POA: Diagnosis not present

## 2023-02-05 DIAGNOSIS — R1312 Dysphagia, oropharyngeal phase: Secondary | ICD-10-CM | POA: Diagnosis not present

## 2023-02-05 DIAGNOSIS — Z7902 Long term (current) use of antithrombotics/antiplatelets: Secondary | ICD-10-CM | POA: Diagnosis not present

## 2023-02-05 DIAGNOSIS — Z91041 Radiographic dye allergy status: Secondary | ICD-10-CM | POA: Diagnosis not present

## 2023-02-05 DIAGNOSIS — I5032 Chronic diastolic (congestive) heart failure: Secondary | ICD-10-CM

## 2023-02-05 DIAGNOSIS — J449 Chronic obstructive pulmonary disease, unspecified: Secondary | ICD-10-CM | POA: Diagnosis not present

## 2023-02-05 DIAGNOSIS — I7389 Other specified peripheral vascular diseases: Secondary | ICD-10-CM | POA: Diagnosis not present

## 2023-02-05 DIAGNOSIS — E872 Acidosis, unspecified: Secondary | ICD-10-CM | POA: Diagnosis not present

## 2023-02-05 DIAGNOSIS — E119 Type 2 diabetes mellitus without complications: Secondary | ICD-10-CM

## 2023-02-05 DIAGNOSIS — K920 Hematemesis: Secondary | ICD-10-CM | POA: Diagnosis not present

## 2023-02-05 DIAGNOSIS — Z993 Dependence on wheelchair: Secondary | ICD-10-CM

## 2023-02-05 DIAGNOSIS — D649 Anemia, unspecified: Secondary | ICD-10-CM | POA: Diagnosis not present

## 2023-02-05 DIAGNOSIS — K219 Gastro-esophageal reflux disease without esophagitis: Secondary | ICD-10-CM | POA: Diagnosis not present

## 2023-02-05 DIAGNOSIS — I110 Hypertensive heart disease with heart failure: Secondary | ICD-10-CM | POA: Diagnosis present

## 2023-02-05 DIAGNOSIS — K922 Gastrointestinal hemorrhage, unspecified: Secondary | ICD-10-CM | POA: Diagnosis not present

## 2023-02-05 DIAGNOSIS — E612 Magnesium deficiency: Secondary | ICD-10-CM | POA: Diagnosis not present

## 2023-02-05 DIAGNOSIS — R1084 Generalized abdominal pain: Secondary | ICD-10-CM | POA: Diagnosis not present

## 2023-02-05 DIAGNOSIS — K317 Polyp of stomach and duodenum: Secondary | ICD-10-CM | POA: Diagnosis not present

## 2023-02-05 DIAGNOSIS — K449 Diaphragmatic hernia without obstruction or gangrene: Secondary | ICD-10-CM | POA: Diagnosis not present

## 2023-02-05 DIAGNOSIS — Z7901 Long term (current) use of anticoagulants: Secondary | ICD-10-CM | POA: Diagnosis not present

## 2023-02-05 DIAGNOSIS — I69398 Other sequelae of cerebral infarction: Secondary | ICD-10-CM | POA: Diagnosis not present

## 2023-02-05 DIAGNOSIS — R1310 Dysphagia, unspecified: Secondary | ICD-10-CM

## 2023-02-05 DIAGNOSIS — Z886 Allergy status to analgesic agent status: Secondary | ICD-10-CM | POA: Diagnosis not present

## 2023-02-05 DIAGNOSIS — K2091 Esophagitis, unspecified with bleeding: Secondary | ICD-10-CM | POA: Diagnosis not present

## 2023-02-05 DIAGNOSIS — E785 Hyperlipidemia, unspecified: Secondary | ICD-10-CM | POA: Diagnosis present

## 2023-02-05 DIAGNOSIS — Z6832 Body mass index (BMI) 32.0-32.9, adult: Secondary | ICD-10-CM | POA: Diagnosis not present

## 2023-02-05 DIAGNOSIS — Z794 Long term (current) use of insulin: Secondary | ICD-10-CM

## 2023-02-05 DIAGNOSIS — Z881 Allergy status to other antibiotic agents status: Secondary | ICD-10-CM | POA: Diagnosis not present

## 2023-02-05 DIAGNOSIS — D509 Iron deficiency anemia, unspecified: Secondary | ICD-10-CM | POA: Diagnosis not present

## 2023-02-05 DIAGNOSIS — Z8673 Personal history of transient ischemic attack (TIA), and cerebral infarction without residual deficits: Secondary | ICD-10-CM | POA: Diagnosis not present

## 2023-02-05 DIAGNOSIS — I69354 Hemiplegia and hemiparesis following cerebral infarction affecting left non-dominant side: Secondary | ICD-10-CM | POA: Diagnosis not present

## 2023-02-05 DIAGNOSIS — K3184 Gastroparesis: Secondary | ICD-10-CM

## 2023-02-05 DIAGNOSIS — G4733 Obstructive sleep apnea (adult) (pediatric): Secondary | ICD-10-CM

## 2023-02-05 DIAGNOSIS — F320 Major depressive disorder, single episode, mild: Secondary | ICD-10-CM

## 2023-02-05 DIAGNOSIS — G8194 Hemiplegia, unspecified affecting left nondominant side: Secondary | ICD-10-CM | POA: Diagnosis not present

## 2023-02-05 DIAGNOSIS — R933 Abnormal findings on diagnostic imaging of other parts of digestive tract: Secondary | ICD-10-CM | POA: Diagnosis not present

## 2023-02-05 DIAGNOSIS — E7849 Other hyperlipidemia: Secondary | ICD-10-CM | POA: Diagnosis not present

## 2023-02-05 DIAGNOSIS — K209 Esophagitis, unspecified without bleeding: Secondary | ICD-10-CM | POA: Diagnosis not present

## 2023-02-05 DIAGNOSIS — Z743 Need for continuous supervision: Secondary | ICD-10-CM | POA: Diagnosis not present

## 2023-02-05 DIAGNOSIS — E0590 Thyrotoxicosis, unspecified without thyrotoxic crisis or storm: Secondary | ICD-10-CM | POA: Diagnosis not present

## 2023-02-05 DIAGNOSIS — K3189 Other diseases of stomach and duodenum: Secondary | ICD-10-CM | POA: Diagnosis not present

## 2023-02-05 DIAGNOSIS — I10 Essential (primary) hypertension: Secondary | ICD-10-CM | POA: Diagnosis not present

## 2023-02-05 DIAGNOSIS — J342 Deviated nasal septum: Secondary | ICD-10-CM | POA: Diagnosis not present

## 2023-02-05 DIAGNOSIS — K2211 Ulcer of esophagus with bleeding: Secondary | ICD-10-CM | POA: Diagnosis present

## 2023-02-05 DIAGNOSIS — R531 Weakness: Secondary | ICD-10-CM | POA: Diagnosis not present

## 2023-02-05 DIAGNOSIS — R000 Tachycardia, unspecified: Secondary | ICD-10-CM | POA: Diagnosis not present

## 2023-02-05 DIAGNOSIS — G9341 Metabolic encephalopathy: Secondary | ICD-10-CM | POA: Diagnosis not present

## 2023-02-05 DIAGNOSIS — U071 COVID-19: Secondary | ICD-10-CM | POA: Diagnosis not present

## 2023-02-05 DIAGNOSIS — E1100 Type 2 diabetes mellitus with hyperosmolarity without nonketotic hyperglycemic-hyperosmolar coma (NKHHC): Secondary | ICD-10-CM | POA: Diagnosis not present

## 2023-02-05 DIAGNOSIS — Z87891 Personal history of nicotine dependence: Secondary | ICD-10-CM | POA: Diagnosis not present

## 2023-02-05 DIAGNOSIS — Z03822 Encounter for observation for suspected aspirated (inhaled) foreign body ruled out: Secondary | ICD-10-CM | POA: Diagnosis not present

## 2023-02-05 DIAGNOSIS — R262 Difficulty in walking, not elsewhere classified: Secondary | ICD-10-CM | POA: Diagnosis not present

## 2023-02-05 DIAGNOSIS — Z951 Presence of aortocoronary bypass graft: Secondary | ICD-10-CM

## 2023-02-05 DIAGNOSIS — K2971 Gastritis, unspecified, with bleeding: Secondary | ICD-10-CM | POA: Diagnosis present

## 2023-02-05 DIAGNOSIS — I2510 Atherosclerotic heart disease of native coronary artery without angina pectoris: Secondary | ICD-10-CM | POA: Diagnosis not present

## 2023-02-05 DIAGNOSIS — K5904 Chronic idiopathic constipation: Secondary | ICD-10-CM | POA: Diagnosis not present

## 2023-02-05 DIAGNOSIS — Z7401 Bed confinement status: Secondary | ICD-10-CM | POA: Diagnosis not present

## 2023-02-05 DIAGNOSIS — K5710 Diverticulosis of small intestine without perforation or abscess without bleeding: Secondary | ICD-10-CM | POA: Diagnosis not present

## 2023-02-05 DIAGNOSIS — I6930 Unspecified sequelae of cerebral infarction: Secondary | ICD-10-CM | POA: Diagnosis not present

## 2023-02-05 DIAGNOSIS — G8191 Hemiplegia, unspecified affecting right dominant side: Secondary | ICD-10-CM | POA: Diagnosis not present

## 2023-02-05 DIAGNOSIS — J45909 Unspecified asthma, uncomplicated: Secondary | ICD-10-CM | POA: Diagnosis not present

## 2023-02-05 DIAGNOSIS — E860 Dehydration: Secondary | ICD-10-CM | POA: Diagnosis not present

## 2023-02-05 DIAGNOSIS — M6281 Muscle weakness (generalized): Secondary | ICD-10-CM | POA: Diagnosis not present

## 2023-02-05 DIAGNOSIS — E1143 Type 2 diabetes mellitus with diabetic autonomic (poly)neuropathy: Secondary | ICD-10-CM

## 2023-02-05 DIAGNOSIS — F332 Major depressive disorder, recurrent severe without psychotic features: Secondary | ICD-10-CM | POA: Diagnosis present

## 2023-02-05 DIAGNOSIS — R4701 Aphasia: Secondary | ICD-10-CM | POA: Diagnosis not present

## 2023-02-05 DIAGNOSIS — K5711 Diverticulosis of small intestine without perforation or abscess with bleeding: Secondary | ICD-10-CM | POA: Diagnosis present

## 2023-02-05 DIAGNOSIS — E039 Hypothyroidism, unspecified: Secondary | ICD-10-CM | POA: Diagnosis present

## 2023-02-05 LAB — CBC WITH DIFFERENTIAL/PLATELET
Abs Immature Granulocytes: 0.02 10*3/uL (ref 0.00–0.07)
Basophils Absolute: 0 10*3/uL (ref 0.0–0.1)
Basophils Relative: 1 %
Eosinophils Absolute: 0.1 10*3/uL (ref 0.0–0.5)
Eosinophils Relative: 2 %
HCT: 28.7 % — ABNORMAL LOW (ref 36.0–46.0)
Hemoglobin: 9 g/dL — ABNORMAL LOW (ref 12.0–15.0)
Immature Granulocytes: 0 %
Lymphocytes Relative: 33 %
Lymphs Abs: 2.2 10*3/uL (ref 0.7–4.0)
MCH: 24.1 pg — ABNORMAL LOW (ref 26.0–34.0)
MCHC: 31.4 g/dL (ref 30.0–36.0)
MCV: 76.7 fL — ABNORMAL LOW (ref 80.0–100.0)
Monocytes Absolute: 0.7 10*3/uL (ref 0.1–1.0)
Monocytes Relative: 11 %
Neutro Abs: 3.4 10*3/uL (ref 1.7–7.7)
Neutrophils Relative %: 53 %
Platelets: 278 10*3/uL (ref 150–400)
RBC: 3.74 MIL/uL — ABNORMAL LOW (ref 3.87–5.11)
RDW: 17.4 % — ABNORMAL HIGH (ref 11.5–15.5)
WBC: 6.5 10*3/uL (ref 4.0–10.5)
nRBC: 0 % (ref 0.0–0.2)

## 2023-02-05 LAB — BASIC METABOLIC PANEL
Anion gap: 9 (ref 5–15)
BUN: 9 mg/dL (ref 6–20)
CO2: 19 mmol/L — ABNORMAL LOW (ref 22–32)
Calcium: 7.9 mg/dL — ABNORMAL LOW (ref 8.9–10.3)
Chloride: 108 mmol/L (ref 98–111)
Creatinine, Ser: 0.72 mg/dL (ref 0.44–1.00)
GFR, Estimated: >60 mL/min (ref >60)
Glucose, Bld: 133 mg/dL — ABNORMAL HIGH (ref 70–99)
Potassium: 3.6 mmol/L (ref 3.5–5.1)
Sodium: 136 mmol/L (ref 135–145)

## 2023-02-05 LAB — CBG MONITORING, ED
Glucose-Capillary: 118 mg/dL — ABNORMAL HIGH (ref 70–99)
Glucose-Capillary: 113 mg/dL — ABNORMAL HIGH (ref 70–99)

## 2023-02-05 LAB — GLUCOSE, CAPILLARY
Glucose-Capillary: 105 mg/dL — ABNORMAL HIGH (ref 70–99)
Glucose-Capillary: 96 mg/dL (ref 70–99)

## 2023-02-05 LAB — RESP PANEL BY RT-PCR (RSV, FLU A&B, COVID)  RVPGX2
Influenza A by PCR: NEGATIVE
Influenza B by PCR: NEGATIVE
Resp Syncytial Virus by PCR: NEGATIVE
SARS Coronavirus 2 by RT PCR: POSITIVE — AB

## 2023-02-05 LAB — CBC
HCT: 27.2 % — ABNORMAL LOW (ref 36.0–46.0)
Hemoglobin: 8.9 g/dL — ABNORMAL LOW (ref 12.0–15.0)
MCH: 24.3 pg — ABNORMAL LOW (ref 26.0–34.0)
MCHC: 32.7 g/dL (ref 30.0–36.0)
MCV: 74.1 fL — ABNORMAL LOW (ref 80.0–100.0)
Platelets: 315 10*3/uL (ref 150–400)
RBC: 3.67 MIL/uL — ABNORMAL LOW (ref 3.87–5.11)
RDW: 17.2 % — ABNORMAL HIGH (ref 11.5–15.5)
WBC: 7.5 10*3/uL (ref 4.0–10.5)
nRBC: 0 % (ref 0.0–0.2)

## 2023-02-05 LAB — C-REACTIVE PROTEIN: CRP: 10.3 mg/dL — ABNORMAL HIGH (ref <1.0)

## 2023-02-05 MED ORDER — ADULT MULTIVITAMIN W/MINERALS CH
1.0000 | ORAL_TABLET | Freq: Every day | ORAL | Status: DC
  Administered 2023-02-05 – 2023-02-13 (×8): 1 via ORAL
  Filled 2023-02-05 (×8): qty 1

## 2023-02-05 MED ORDER — NIRMATRELVIR/RITONAVIR (PAXLOVID)TABLET
3.0000 | ORAL_TABLET | Freq: Two times a day (BID) | ORAL | Status: AC
  Administered 2023-02-05 – 2023-02-08 (×6): 3 via ORAL
  Filled 2023-02-05 (×3): qty 30

## 2023-02-05 MED ORDER — LORATADINE 10 MG PO TABS
10.0000 mg | ORAL_TABLET | Freq: Every day | ORAL | Status: DC
  Administered 2023-02-05 – 2023-02-13 (×8): 10 mg via ORAL
  Filled 2023-02-05 (×8): qty 1

## 2023-02-05 MED ORDER — IPRATROPIUM-ALBUTEROL 0.5-2.5 (3) MG/3ML IN SOLN
3.0000 mL | Freq: Four times a day (QID) | RESPIRATORY_TRACT | Status: DC
  Administered 2023-02-05: 3 mL via RESPIRATORY_TRACT
  Filled 2023-02-05 (×6): qty 3

## 2023-02-05 MED ORDER — FOLIC ACID 1 MG PO TABS
1.0000 mg | ORAL_TABLET | Freq: Every day | ORAL | Status: DC
  Administered 2023-02-05 – 2023-02-13 (×8): 1 mg via ORAL
  Filled 2023-02-05 (×8): qty 1

## 2023-02-05 MED ORDER — FLUTICASONE PROPIONATE 50 MCG/ACT NA SUSP
2.0000 | Freq: Every day | NASAL | Status: DC
  Administered 2023-02-07 – 2023-02-12 (×5): 2 via NASAL
  Filled 2023-02-05 (×2): qty 16

## 2023-02-05 NOTE — Progress Notes (Signed)
 PROGRESS NOTE    Lori [name]  FMW:[id number] DOB: 1970/10/02 DOA: 02/04/2023 PCP: Leonarda [name], NP    Chief Complaint  Patient presents with   Emesis    Vomitting brown emesis since 10 pm, constipated for 3 days.    Brief Narrative:  Patient 53 year old unfortunate female history of diabetes type 2 with gastroparesis, CAD status post CABG, chronic diastolic heart failure, OSA on CPAP, history of CVA residual left-sided weakness currently bedbound, history of DVT on Xarelto , hypothyroidism, multiple recent hospitalizations last one 01/30/2023 for transient acute encephalopathy left facial droop, aphasia with unremarkable workup with discontinuation of amlodipine  to avoid hypotension presented to the ED with coffee-ground emesis.  Patient seen in consultation for GI and patient for upper endoscopy.  Patient also noted to have incidental COVID and started on Paxlovid .   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  Melanotic stools -Patient presented with a 1 day history of coffee-ground emesis/hematemesis noted  to have a bout of hematemesis in the ED per ED physician. -Patient noted to be chronically on Xarelto  and Plavix . -Patient also does state has had a month of black stools per her aide. -Patient denies any prior history of peptic ulcer disease, no history of NSAID use. -Hemoglobin noted at 10.5 with repeat at 10.9. -Patient IV fluid boluses in the ED and placed on IV fluids due to dehydration and tachycardia. -Patient received Protonix  80 mg IV push x 1 on presentation to the ED, currently on IV PPI twice daily which we will continue for now.   -Continue hold Xarelto  and Plavix  and will defer resumption to GI. -Clear liquid diet. -Patient seen by GI on presentation and possibility for upper endoscopy today however has been postponed until tomorrow. -Make n.p.o. after midnight. -Serial CBC. -Follow H&H, -Transfusion threshold hemoglobin < 8.   2.  Dehydration -IV fluids.   3.  Hyperthyroidism -Continue home regimen Tapazole  and metoprolol .   4.  Sinus tachycardia -Likely secondary to history of hyperthyroidism patient likely has not received her beta-blocker on day of admission. -Heart rate improved with resumption of home regimen Lopressor .   -IV fluids, supportive care.  5.  Incidental COVID-19 -Patient noted to have a positive SARS coronavirus COVID-19 PCR on admission. -Patient with complaints of congestion cough x 4 days. -Check a CRP. -Place on Paxlovid  due to patient's multiple comorbidities. -Placed on scheduled Combivent, Flonase , Claritin . -Symptomatic treatment.    6.  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 -Continue to hold Plavix  and Xarelto . -Hold statin due to possible interactions with Xarelto . -PT/OT/SLP   7.  Diabetes mellitus type 2 with gastroparesis -Hemoglobin A1c 8.5 (11/24/2022) -CBG noted at 118 this morning. -Continue decreased/half home dose of long-acting insulin  of Semglee  10 units nightly, SSI.   -Continue IV Reglan .  -Clear liquid diet n.p.o. after midnight for probable EGD.   -Follow.   8.  GERD -Continue PPI.   9.  MDD -Stable.   -Continue home regimen Lexapro .    10.  CAD/status post CABG -Stable. -Continue home cardiac regimen.   11.  Hyperlipidemia -Hold statin secondary to interaction with Paxlovid . -Resume statin after completion of Paxlovid .  12.  OSA on CPAP -Due to presentation with nausea and coffee-ground emesis we will hold CPAP for now.  13.  Morbid obesity -BMI 32.92 kg/m -Lifestyle modification -Outpatient follow-up with PCP.     DVT prophylaxis: SCDs Code Status: Full Family Communication: Updated patient and husband at bedside. Disposition: Likely back home when clinically improved and cleared by GI.  Status is: Observation The patient will require care spanning > 2 midnights and should be moved to inpatient because: Severity of illness   Consultants:  GI: Dr. Leigh 02/04/2023  Procedures:  CT abdomen pelvis 02/04/2023 Chest x-ray 02/04/2023   Antimicrobials:  Anti-infectives (From admission, onward)    Start     Dose/Rate Route Frequency Ordered Stop   02/05/23 1500  nirmatrelvir /ritonavir  (PAXLOVID ) 3 tablet        3 tablet Oral 2 times daily 02/05/23 0743 02/10/23 0959         Subjective: Patient laying in bed on gurney in ED.  Denies any further coffee-ground emesis overnight and this morning.  No chest pain.  States has had some nasal congestion for 4 days.  Husband at bedside.  Denies any shortness of breath.  No chest pain.  Objective: Vitals:   02/05/23 1215 02/05/23 1230 02/05/23 1300 02/05/23 1400  BP:   110/80 115/75  Pulse:    87  Resp:  (!) 25 18 (!) 22  Temp:      TempSrc:      SpO2: 99% 99% 99% 96%  Weight:      Height:        Intake/Output Summary (Last 24 hours) at 02/05/2023 1554 Last data filed at 02/04/2023 1831 Gross per 24 hour  Intake 2000 ml  Output --  Net 2000 ml   Filed Weights    02/04/23 0734  Weight: 81.6 kg    Examination:  General exam: Appears calm and comfortable  Respiratory system: Clear to auscultation. Respiratory effort normal. Cardiovascular system: S1 & S2 heard, RRR. No JVD, murmurs, rubs, gallops or clicks. No pedal edema. Gastrointestinal system: Abdomen is nondistended, soft and nontender. No organomegaly or masses felt. Normal bowel sounds heard. Central nervous system: Alert and oriented.  Left upper extremity and contractures.  Some dysarthric speech.  5/5 right upper extremity and right lower extremity strength.  3-4/5 left lower extremity strength.  2-3/5 left upper extremity strength.  Extremities: Symmetric 5 x 5 power. Skin: No rashes, lesions or ulcers Psychiatry: Judgement and insight appear normal. Mood & affect appropriate.     Data Reviewed: I have personally reviewed following labs and imaging studies  CBC: Recent Labs  Lab 01/30/23 1509 01/31/23 0456 02/04/23 0758 02/04/23 1112 02/04/23 1113 02/04/23 2340 02/05/23 0744  WBC 9.0 11.1* 9.2  --   --  7.5 6.5  NEUTROABS  --   --  6.6  --   --   --  3.4  HGB 10.8* 9.5* 10.5* 10.9* 10.9*  8.9* 9.0*  HCT 32.3* 29.4* 32.1* 32.0* 32.0* 27.2* 28.7*  MCV 74.9* 75.8* 75.2*  --   --  74.1* 76.7*  PLT 442* 420* 361  --   --  315 278    Basic Metabolic Panel: Recent Labs  Lab 01/30/23 1509 01/31/23 0456 02/04/23 0758 02/04/23 1112 02/04/23 1113 02/05/23 0744  NA 132* 132* 134* 134* 134* 136  K 4.0 3.8 3.8 3.5 3.5 3.6  CL 101 101 100 101  --  108  CO2 19* 22 21*  --   --  19*  GLUCOSE 283* 187* 281* 309*  --  133*  BUN 29* 21* 11 11  --  9  CREATININE 1.31* 1.05* 0.96 0.80  --  0.72  CALCIUM  8.7* 8.6* 8.9  --   --  7.9*  MG 2.1  --   --   --   --   --     GFR: Estimated Creatinine Clearance: 83.3 mL/min (by C-G formula based on SCr of 0.72 mg/dL).  Liver Function Tests: Recent Labs  Lab 01/30/23 1509 02/04/23 0758  AST 13* 17  ALT 15 16  ALKPHOS 72 63   BILITOT 0.4 0.6  PROT 6.8 7.3  ALBUMIN  3.3* 3.2*    CBG: Recent Labs  Lab 02/04/23 1555 02/04/23 1718 02/04/23 2251 02/05/23 0746 02/05/23 1151  GLUCAP 234* 195* 122* 118* 113*     Recent Results (from the past 240 hours)  Resp panel by RT-PCR (RSV, Flu A&B, Covid) Anterior Nasal Swab     Status: Abnormal   Collection Time: 02/04/23 10:55 PM   Specimen: Anterior Nasal Swab  Result Value Ref Range Status   SARS Coronavirus 2 by RT PCR POSITIVE (A) NEGATIVE Final   Influenza A by PCR NEGATIVE NEGATIVE Final   Influenza B by PCR NEGATIVE NEGATIVE Final    Comment: (NOTE) The Xpert Xpress SARS-CoV-2/FLU/RSV plus assay is intended as an aid in the diagnosis of influenza from Nasopharyngeal swab specimens and should not be used as a sole basis for treatment. Nasal washings and aspirates are unacceptable for Xpert Xpress SARS-CoV-2/FLU/RSV testing.  Fact Sheet for Patients: bloggercourse.com  Fact Sheet for Healthcare Providers: seriousbroker.it  This test is not yet approved or cleared by the [place]  FDA and has been authorized for detection and/or diagnosis of SARS-CoV-2 by FDA under an Emergency Use Authorization (EUA). This EUA will remain in effect (meaning this test can be used) for the duration of the COVID-19 declaration under Section 564(b)(1) of the Act, 21 U.S.C. section 360bbb-3(b)(1), unless the authorization is terminated or revoked.     Resp Syncytial Virus by PCR NEGATIVE NEGATIVE Final    Comment: (NOTE) Fact Sheet for Patients: bloggercourse.com  Fact Sheet for Healthcare Providers: seriousbroker.it  This test is not yet approved or cleared by the [place]  FDA and has been authorized for detection and/or diagnosis of SARS-CoV-2 by FDA under an Emergency Use Authorization (EUA). This EUA will remain in effect (meaning this test can be used)  for the duration of the COVID-19 declaration under Section 564(b)(1) of the Act, 21 U.S.C. section 360bbb-3(b)(1), unless the authorization is terminated or revoked.  Performed at [facility], [address], [place], [place] [address]          Radiology Studies: CT ABDOMEN PELVIS WO CONTRAST Result Date: 02/04/2023 CLINICAL DATA:  Abdominal pain, acute (Ped 0-17y) pancreatitis?/complications//SBO?. EXAM: CT ABDOMEN AND PELVIS WITHOUT CONTRAST TECHNIQUE: Multidetector CT imaging of the abdomen and pelvis was  performed following the standard protocol without IV contrast. RADIATION DOSE REDUCTION: This exam was performed according to the departmental dose-optimization program which includes automated exposure control, adjustment of the mA and/or kV according to patient size and/or use of iterative reconstruction technique. COMPARISON:  CT scan abdomen and pelvis from 11/22/2022. FINDINGS: Lower chest: There is small right pleural effusion which is new since the prior study. No left pleural effusion. The visualized lungs are otherwise clear. The heart is normal in size. No pericardial effusion. There are coronary artery atherosclerotic calcifications, in keeping with coronary artery disease. Hepatobiliary: The liver is normal in size. Non-cirrhotic configuration. No suspicious mass. No intrahepatic or extrahepatic bile duct dilation. Gallbladder is surgically absent. Pancreas: Mild atrophy of the pancreas. There are several dystrophic calcifications throughout the pancreas, nonspecific but commonly seen as a sequela of chronic pancreatitis. No suspicious pancreatic mass seen within the limitations of this unenhanced exam. No peripancreatic fat stranding. Spleen: Within normal limits. No focal lesion. Adrenals/Urinary Tract: Unremarkable left adrenal gland. Right adrenal gland is not well visualized and may have been surgically removed. Correlate with surgical history. No suspicious renal mass  within the limitations of this unenhanced exam. No nephroureterolithiasis or obstructive uropathy. Unremarkable urinary bladder. Stomach/Bowel: Redemonstration of a diverticulum arising from the third part of duodenum. No disproportionate dilation of the small or large bowel loops. No evidence of abnormal bowel wall thickening or inflammatory changes. The appendix was not visualized; however there is no acute inflammatory process in the right lower quadrant. There are multiple diverticula throughout the colon, without imaging signs of diverticulitis. Vascular/Lymphatic: No ascites or pneumoperitoneum. No abdominal or pelvic lymphadenopathy, by size criteria. No aneurysmal dilation of the major abdominal arteries. There are marked peripheral atherosclerotic vascular calcifications of the aorta and its major branches. Reproductive: The uterus is surgically absent. No large adnexal mass. Other: There is periumbilical surgical scar in small fat containing hernia. There is also irregular hyperattenuating soft tissue along the lower midline anterior abdominal wall, similar to the prior study, likely from prior surgery related fibrosis/scarring. No abnormal collection seen. Musculoskeletal: No suspicious osseous lesions. There are mild multilevel degenerative changes in the visualized spine. IMPRESSION: 1. No acute inflammatory process identified within the abdomen or pelvis. No imaging evidence of acute pancreatitis. No bowel obstruction. 2. New small right pleural effusion. 3. Multiple other nonacute observations, as described above. Aortic Atherosclerosis (ICD10-I70.0). Electronically Signed   By: Ree Molt M.D.   On: 02/04/2023 14:46   DG Chest 1 View Result Date: 02/04/2023 CLINICAL DATA:  Cough and emesis EXAM: CHEST  1 VIEW COMPARISON:  01/31/2023 FINDINGS: Stable cardiomediastinal silhouette. Aortic atherosclerotic calcification. Sternotomy and CABG. No focal consolidation, pleural effusion, or  pneumothorax. No displaced rib fractures. IMPRESSION: No active disease. Electronically Signed   By: Norman Gatlin M.D.   On: 02/04/2023 08:56        Scheduled Meds:  baclofen   10 mg Oral TID   escitalopram   20 mg Oral q morning   ezetimibe   10 mg Oral Daily   fluticasone   2 spray Each Nare Daily   folic acid   1 mg Oral Daily   gabapentin   100 mg Oral TID   insulin  aspart  0-15 Units Subcutaneous TID WC   insulin  glargine-yfgn  10 Units Subcutaneous QHS   ipratropium-albuterol   3 mL Inhalation Q6H   loratadine   10 mg Oral Daily   LORazepam   0.5 mg Oral QHS   methimazole   5 mg Oral q morning  metoCLOPramide  (REGLAN ) injection  5 mg Intravenous Q8H   metoprolol  tartrate  100 mg Oral BID   multivitamin with minerals  1 tablet Oral Daily   nirmatrelvir /ritonavir   3 tablet Oral BID   pantoprazole  (PROTONIX ) IV  40 mg Intravenous Q12H   tamsulosin   0.4 mg Oral QHS   topiramate   25 mg Oral BID   Continuous Infusions:  sodium chloride  125 mL/hr at 02/05/23 0747     LOS: 0 days    Time spent: 40 minutes    Toribio Hummer, MD [facility]   To contact the attending provider between 7A-7P or the covering provider during after hours 7P-7A, please log into the web site [email or web address] and access using universal [place] password for that web site. If you do not have the password, please call the hospital operator.  02/05/2023, 3:54 PM

## 2023-02-05 NOTE — ED Notes (Signed)
Pt was incontinent of urine. Cleaned pt and applied a clean brief.

## 2023-02-05 NOTE — Progress Notes (Signed)
 [facility] Subjective: Patient is a 53 year old female with multiple medical problems including gastroparesis CAD status post CABG on Plavix  along with history of chronic diastolic heart failure sleep apnea and a history of DVT on Xarelto , admitted with coffee-ground emesis.  Patient has had not had any problems with CGE through the night as per the nursing staff.  Patient seems to understand my questions but did not respond verbally to any other questions I asked her.  She gives a history of abdominal pain in the periumbilical area her hemoglobin is stable at 9 g/dL today. Objective: Vital signs in last 24 hours: Temp:  [98.1 F (36.7 C)-99.6 F (37.6 C)] 98.6 F (37 C) (01/01 0744) Pulse Rate:  [41-123] 89 (01/01 0800) Resp:  [9-36] 9 (01/01 0800) BP: (91-129)/(58-91) 97/70 (01/01 0800) SpO2:  [96 %-100 %] 97 % (01/01 0800)    Intake/Output from previous day: 12/31 0701 - 01/01 0700 In: 2000 [IV Piggyback:2000] Out: -  Intake/Output this shift: No intake/output data recorded.  General appearance: appears stated age, fatigued, no distress, and slowed mentation Resp: clear to auscultation bilaterally Cardio: regular rate and rhythm, S1, S2 normal, no murmur, click, rub or gallop GI: soft, non-tender; bowel sounds normal; no masses,  no organomegaly  Lab Results: Recent Labs    02/04/23 0758 02/04/23 1112 02/04/23 1113 02/04/23 2340 02/05/23 0744  WBC 9.2  --   --  7.5 6.5  HGB 10.5*   < > 10.9* 8.9* 9.0*  HCT 32.1*   < > 32.0* 27.2* 28.7*  PLT 361  --   --  315 278   < > = values in this interval not displayed.   BMET Recent Labs    02/04/23 0758 02/04/23 1112 02/04/23 1113 02/05/23 0744  NA 134* 134* 134* 136  K 3.8 3.5 3.5 3.6  CL 100 101  --  108  CO2 21*  --   --  19*  GLUCOSE 281* 309*  --  133*  BUN 11 11  --  9  CREATININE 0.96 0.80  --  0.72  CALCIUM  8.9  --   --  7.9*   LFT Recent Labs    02/04/23 0758  PROT 7.3  ALBUMIN  3.2*  AST 17   ALT 16  ALKPHOS 63  BILITOT 0.6   PT/INR No results for input(s): LABPROT, INR in the last 72 hours. Hepatitis Panel No results for input(s): HEPBSAG, HCVAB, HEPAIGM, HEPBIGM in the last 72 hours. C-Diff No results for input(s): CDIFFTOX in the last 72 hours. No results for input(s): CDIFFPCR in the last 72 hours. Fecal Lactopherrin No results for input(s): FECLLACTOFRN in the last 72 hours.  Studies/Results: CT ABDOMEN PELVIS WO CONTRAST Result Date: 02/04/2023 CLINICAL DATA:  Abdominal pain, acute (Ped 0-17y) pancreatitis?/complications//SBO?. EXAM: CT ABDOMEN AND PELVIS WITHOUT CONTRAST TECHNIQUE: Multidetector CT imaging of the abdomen and pelvis was performed following the standard protocol without IV contrast. RADIATION DOSE REDUCTION: This exam was performed according to the departmental dose-optimization program which includes automated exposure control, adjustment of the mA and/or kV according to patient size and/or use of iterative reconstruction technique. COMPARISON:  CT scan abdomen and pelvis from 11/22/2022. FINDINGS: Lower chest: There is small right pleural effusion which is new since the prior study. No left pleural effusion. The visualized lungs are otherwise clear. The heart is normal in size. No pericardial effusion. There are coronary artery atherosclerotic calcifications, in keeping with coronary artery disease. Hepatobiliary: The liver is normal in size. Non-cirrhotic  configuration. No suspicious mass. No intrahepatic or extrahepatic bile duct dilation. Gallbladder is surgically absent. Pancreas: Mild atrophy of the pancreas. There are several dystrophic calcifications throughout the pancreas, nonspecific but commonly seen as a sequela of chronic pancreatitis. No suspicious pancreatic mass seen within the limitations of this unenhanced exam. No peripancreatic fat stranding. Spleen: Within normal limits. No focal lesion. Adrenals/Urinary Tract: Unremarkable  left adrenal gland. Right adrenal gland is not well visualized and may have been surgically removed. Correlate with surgical history. No suspicious renal mass within the limitations of this unenhanced exam. No nephroureterolithiasis or obstructive uropathy. Unremarkable urinary bladder. Stomach/Bowel: Redemonstration of a diverticulum arising from the third part of duodenum. No disproportionate dilation of the small or large bowel loops. No evidence of abnormal bowel wall thickening or inflammatory changes. The appendix was not visualized; however there is no acute inflammatory process in the right lower quadrant. There are multiple diverticula throughout the colon, without imaging signs of diverticulitis. Vascular/Lymphatic: No ascites or pneumoperitoneum. No abdominal or pelvic lymphadenopathy, by size criteria. No aneurysmal dilation of the major abdominal arteries. There are marked peripheral atherosclerotic vascular calcifications of the aorta and its major branches. Reproductive: The uterus is surgically absent. No large adnexal mass. Other: There is periumbilical surgical scar in small fat containing hernia. There is also irregular hyperattenuating soft tissue along the lower midline anterior abdominal wall, similar to the prior study, likely from prior surgery related fibrosis/scarring. No abnormal collection seen. Musculoskeletal: No suspicious osseous lesions. There are mild multilevel degenerative changes in the visualized spine. IMPRESSION: 1. No acute inflammatory process identified within the abdomen or pelvis. No imaging evidence of acute pancreatitis. No bowel obstruction. 2. New small right pleural effusion. 3. Multiple other nonacute observations, as described above. Aortic Atherosclerosis (ICD10-I70.0). Electronically Signed   By: Ree Molt M.D.   On: 02/04/2023 14:46   DG Chest 1 View Result Date: 02/04/2023 CLINICAL DATA:  Cough and emesis EXAM: CHEST  1 VIEW COMPARISON:  01/31/2023  FINDINGS: Stable cardiomediastinal silhouette. Aortic atherosclerotic calcification. Sternotomy and CABG. No focal consolidation, pleural effusion, or pneumothorax. No displaced rib fractures. IMPRESSION: No active disease. Electronically Signed   By: Norman Gatlin M.D.   On: 02/04/2023 08:56    Medications: I have reviewed the patient's current medications. Prior to Admission: (Not in a hospital admission)  Scheduled:  atorvastatin   80 mg Oral Daily   baclofen   10 mg Oral TID   escitalopram   20 mg Oral q morning   ezetimibe   10 mg Oral Daily   folic acid   1 mg Oral Daily   gabapentin   100 mg Oral TID   insulin  aspart  0-15 Units Subcutaneous TID WC   insulin  glargine-yfgn  10 Units Subcutaneous QHS   ipratropium-albuterol   3 mL Inhalation Q6H   LORazepam   0.5 mg Oral QHS   methimazole   5 mg Oral q morning   metoCLOPramide  (REGLAN ) injection  5 mg Intravenous Q8H   metoprolol  tartrate  100 mg Oral BID   multivitamin with minerals  1 tablet Oral Daily   nirmatrelvir /ritonavir   3 tablet Oral BID   ondansetron  (ZOFRAN ) IV  4 mg Intravenous Once   pantoprazole  (PROTONIX ) IV  40 mg Intravenous Q12H   ranolazine   500 mg Oral BID   tamsulosin   0.4 mg Oral QHS   topiramate   25 mg Oral BID   Continuous:  sodium chloride  125 mL/hr at 02/05/23 0747   PRN:albuterol , cyclobenzaprine   Assessment/Plan: 1) Coffee-ground emesis with chronic microcytic anemia  on Plavix  and Xarelto -normal BUN/ GERD-as her hemoglobin is stable and she seems stable from a GI standpoint her EGD will be postponed till tomorrow to be done by Dr. Leigh. 2) Gastroparesis-on Reglan  at home. 3) Chronic constipation with occasional rectal bleeding. 4) Abnormal CT scan-mild pancreatic atrophy with calcifications of the pancreas-?  chronic pancreatitis. 5) Hyperthyroidism on Tapazole . 6) Chronic diastolic heart failure. 7) Obstructive sleep apnea 8) History of a pheochromocytoma status post right adrenalectomy. 5)    LOS: 0 days   Renaye Sous 02/05/2023, 9:46 AM

## 2023-02-05 NOTE — ED Notes (Signed)
 Pt states she takes her pills whole in applesauce

## 2023-02-05 NOTE — ED Notes (Signed)
 ED TO INPATIENT HANDOFF REPORT  ED Nurse Name and Phone #: Paulina [phone]  S Name/Age/Gender Lori [name] [name] 53 y.o. female Room/Bed: 009C/009C  Code Status   Code Status: Full Code  Home/SNF/Other Home Patient oriented to: self, place, time, and situation Is this baseline? Yes   Triage Complete: Triage complete  Chief Complaint Hematemesis [K92.0]  Triage Note Nausea vomiting since 10 pm, dark brown, no texture previous gi bleed.  Constipated for 3x days. Ate/drank but couldn't keep down.  Refused I'm medicaiont ie zofran .  Poor iv access according to ems.  Stable vitals except tacky to 120s, oriented x4.  Recent hospitalization.   Allergies Allergies  Allergen Reactions   Asa [Aspirin ] Anaphylaxis, Swelling and Other (See Comments)    Throat closes   Contrast Media [Iodinated Contrast Media] Anaphylaxis and Swelling   Imitrex [Sumatriptan] Anaphylaxis   Ms Contin  [Morphine ] Anaphylaxis and Swelling   Penicillins Swelling and Other (See Comments)    Mouth and eyes swell   Firvanq  [Vancomycin ] Other (See Comments)    Red Man's Syndrome   Cipro  [Ciprofloxacin  Hcl] Nausea And Vomiting   Nitrofuran Derivatives Anxiety   Wound Dressing Adhesive Itching and Rash    Level of Care/Admitting Diagnosis ED Disposition     ED Disposition  Admit   Condition  --   Comment  Hospital Area: [facility] [[id number]]  Level of Care: Telemetry Medical [104]  May place patient in observation at [facility] or [facility] if equivalent level of care is available:: No  Covid Evaluation: Asymptomatic - no recent exposure (last 10 days) testing not required  Diagnosis: Hematemesis [578.0.ICD-9-CM]  Admitting Physician: SEBASTIAN TORIBIO GAILS [[id number]]  Attending Physician: SEBASTIAN TORIBIO GAILS [[id number]]          B Medical/Surgery History Past Medical History:  Diagnosis Date   CAD (coronary artery disease)    Depression    Diabetes mellitus  without complication (HCC)    History of CT scan    History of mammogram    History of MRI    Hypertension    Pheochromocytoma    Reversible cerebrovascular vasoconstriction syndrome    Stroke (HCC)    Thyroid  disease    Past Surgical History:  Procedure Laterality Date   ABDOMINAL HYSTERECTOMY     CESAREAN SECTION     3   CORONARY ARTERY BYPASS GRAFT     Right adrenal gland removal for pheochromocytoma Right      A IV Location/Drains/Wounds Patient Lines/Drains/Airways Status     Active Line/Drains/Airways     Name Placement date Placement time Site Days   Peripheral IV 01/30/23 22 G 2.5 Anterior;Right Forearm 01/30/23  1506  Forearm  6   Peripheral IV 02/04/23 20 G 2.5 Right Antecubital 02/04/23  1420  Antecubital  1   Wound / Incision (Open or Dehisced) 11/06/22 Irritant Dermatitis (Moisture Associated Skin Damage) Groin Anterior;Right;Left  11/06/22  1017  Groin  91   Wound / Incision (Open or Dehisced) 01/19/23 Irritant Dermatitis (Moisture Associated Skin Damage) Thigh Anterior;Left;Right MASD 01/19/23  1615  Thigh  17            Intake/Output Last 24 hours  Intake/Output Summary (Last 24 hours) at 02/05/2023 1242 Last data filed at 02/04/2023 1831 Gross per 24 hour  Intake 2000 ml  Output --  Net 2000 ml    Labs/Imaging Results for orders placed or performed during the hospital encounter of 02/04/23 (from the past 48 hours)  CBC  with Differential     Status: Abnormal   Collection Time: 02/04/23  7:58 AM  Result Value Ref Range   WBC 9.2 4.0 - 10.5 K/uL   RBC 4.27 3.87 - 5.11 MIL/uL   Hemoglobin 10.5 (L) 12.0 - 15.0 g/dL   HCT 67.8 (L) 63.9 - 53.9 %   MCV 75.2 (L) 80.0 - 100.0 fL   MCH 24.6 (L) 26.0 - 34.0 pg   MCHC 32.7 30.0 - 36.0 g/dL   RDW 82.8 (H) 88.4 - 84.4 %   Platelets 361 150 - 400 K/uL   nRBC 0.0 0.0 - 0.2 %   Neutrophils Relative % 72 %   Neutro Abs 6.6 1.7 - 7.7 K/uL   Lymphocytes Relative 17 %   Lymphs Abs 1.6 0.7 - 4.0 K/uL    Monocytes Relative 10 %   Monocytes Absolute 1.0 0.1 - 1.0 K/uL   Eosinophils Relative 0 %   Eosinophils Absolute 0.0 0.0 - 0.5 K/uL   Basophils Relative 1 %   Basophils Absolute 0.1 0.0 - 0.1 K/uL   Immature Granulocytes 0 %   Abs Immature Granulocytes 0.03 0.00 - 0.07 K/uL    Comment: Performed at [facility], [address] [place], [place] [address]  Comprehensive metabolic panel     Status: Abnormal   Collection Time: 02/04/23  7:58 AM  Result Value Ref Range   Sodium 134 (L) 135 - 145 mmol/L   Potassium 3.8 3.5 - 5.1 mmol/L   Chloride 100 98 - 111 mmol/L   CO2 21 (L) 22 - 32 mmol/L   Glucose, Bld 281 (H) 70 - 99 mg/dL    Comment: Glucose reference range applies only to samples taken after fasting for at least 8 hours.   BUN 11 6 - 20 mg/dL   Creatinine, Ser 9.03 0.44 - 1.00 mg/dL   Calcium  8.9 8.9 - 10.3 mg/dL   Total Protein 7.3 6.5 - 8.1 g/dL   Albumin  3.2 (L) 3.5 - 5.0 g/dL   AST 17 15 - 41 U/L   ALT 16 0 - 44 U/L   Alkaline Phosphatase 63 38 - 126 U/L   Total Bilirubin 0.6 0.0 - 1.2 mg/dL   GFR, Estimated >39 >39 mL/min    Comment: (NOTE) Calculated using the CKD-EPI Creatinine Equation (2021)    Anion gap 13 5 - 15    Comment: Performed at [facility], [address], [place], [place] [address]  Lipase, blood     Status: Abnormal   Collection Time: 02/04/23  7:58 AM  Result Value Ref Range   Lipase 113 (H) 11 - 51 U/L    Comment: Performed at [facility], [address] [place], [place] [address]  Beta-hydroxybutyric acid     Status: Abnormal   Collection Time: 02/04/23  8:39 AM  Result Value Ref Range   Beta-Hydroxybutyric Acid 0.76 (H) 0.05 - 0.27 mmol/L    Comment: Performed at [facility], [address] [place], [place] [address]  Type and screen [facility]     Status: None   Collection Time: 02/04/23 11:01 AM  Result Value Ref Range   ABO/RH(D) O POS    Antibody Screen NEG    Sample Expiration       02/07/2023,2359 Performed at [facility], [address] [place], [place] [address]   I-stat chem 8, ED (not at [facility], [facility] or [facility])     Status:  Abnormal   Collection Time: 02/04/23 11:12 AM  Result Value Ref Range   Sodium 134 (L) 135 - 145 mmol/L   Potassium 3.5 3.5 - 5.1 mmol/L   Chloride 101 98 - 111 mmol/L   BUN 11 6 - 20 mg/dL   Creatinine, Ser 9.19 0.44 - 1.00 mg/dL   Glucose, Bld 690 (H) 70 - 99 mg/dL    Comment: Glucose reference range applies only to samples taken after fasting for at least 8 hours.   Calcium , Ion 1.04 (L) 1.15 - 1.40 mmol/L   TCO2 20 (L) 22 - 32 mmol/L   Hemoglobin 10.9 (L) 12.0 - 15.0 g/dL   HCT 67.9 (L) 63.9 - 53.9 %  I-Stat CG4 Lactic Acid, ED     Status: None   Collection Time: 02/04/23 11:12 AM  Result Value Ref Range   Lactic Acid, Venous 0.9 0.5 - 1.9 mmol/L  I-Stat venous blood gas, ([facility] ED, [facility], [facility])     Status: Abnormal   Collection Time: 02/04/23 11:13 AM  Result Value Ref Range   pH, Ven 7.525 (H) 7.25 - 7.43   pCO2, Ven 24.3 (L) 44 - 60 mmHg   pO2, Ven 126 (H) 32 - 45 mmHg   Bicarbonate 20.1 20.0 - 28.0 mmol/L   TCO2 21 (L) 22 - 32 mmol/L   O2 Saturation 99 %   Acid-base deficit 2.0 0.0 - 2.0 mmol/L   Sodium 134 (L) 135 - 145 mmol/L   Potassium 3.5 3.5 - 5.1 mmol/L   Calcium , Ion 1.03 (L) 1.15 - 1.40 mmol/L   HCT 32.0 (L) 36.0 - 46.0 %   Hemoglobin 10.9 (L) 12.0 - 15.0 g/dL   Sample type VENOUS   CBG monitoring, ED     Status: Abnormal   Collection Time: 02/04/23  3:55 PM  Result Value Ref Range   Glucose-Capillary 234 (H) 70 - 99 mg/dL    Comment: Glucose reference range applies only to samples taken after fasting for at least 8 hours.  Urinalysis, Routine w reflex microscopic -Urine, Clean Catch     Status: Abnormal   Collection Time: 02/04/23  4:54 PM  Result Value Ref Range   Color, Urine YELLOW YELLOW   APPearance HAZY (A) CLEAR   Specific Gravity, Urine 1.012 1.005 - 1.030   pH 5.0 5.0 - 8.0   Glucose, UA 150 (A)  NEGATIVE mg/dL   Hgb urine dipstick NEGATIVE NEGATIVE   Bilirubin Urine NEGATIVE NEGATIVE   Ketones, ur 5 (A) NEGATIVE mg/dL   Protein, ur NEGATIVE NEGATIVE mg/dL   Nitrite NEGATIVE NEGATIVE   Leukocytes,Ua NEGATIVE NEGATIVE    Comment: Performed at [facility], [address] [place], [place] [address]  CBG monitoring, ED     Status: Abnormal   Collection Time: 02/04/23  5:18 PM  Result Value Ref Range   Glucose-Capillary 195 (H) 70 - 99 mg/dL    Comment: Glucose reference range applies only to samples taken after fasting for at least 8 hours.  CBG monitoring, ED     Status: Abnormal   Collection Time: 02/04/23 10:51 PM  Result Value Ref Range   Glucose-Capillary 122 (H) 70 - 99 mg/dL    Comment: Glucose reference range applies only to samples taken after fasting for at least 8 hours.  Resp panel by RT-PCR (RSV, Flu A&B, Covid) Anterior Nasal Swab     Status: Abnormal   Collection Time: 02/04/23 10:55 PM   Specimen: Anterior Nasal Swab  Result Value  Ref Range   SARS Coronavirus 2 by RT PCR POSITIVE (A) NEGATIVE   Influenza A by PCR NEGATIVE NEGATIVE   Influenza B by PCR NEGATIVE NEGATIVE    Comment: (NOTE) The Xpert Xpress SARS-CoV-2/FLU/RSV plus assay is intended as an aid in the diagnosis of influenza from Nasopharyngeal swab specimens and should not be used as a sole basis for treatment. Nasal washings and aspirates are unacceptable for Xpert Xpress SARS-CoV-2/FLU/RSV testing.  Fact Sheet for Patients: bloggercourse.com  Fact Sheet for Healthcare Providers: seriousbroker.it  This test is not yet approved or cleared by the [place]  FDA and has been authorized for detection and/or diagnosis of SARS-CoV-2 by FDA under an Emergency Use Authorization (EUA). This EUA will remain in effect (meaning this test can be used) for the duration of the COVID-19 declaration under Section 564(b)(1) of the Act, 21  U.S.C. section 360bbb-3(b)(1), unless the authorization is terminated or revoked.     Resp Syncytial Virus by PCR NEGATIVE NEGATIVE    Comment: (NOTE) Fact Sheet for Patients: bloggercourse.com  Fact Sheet for Healthcare Providers: seriousbroker.it  This test is not yet approved or cleared by the [place]  FDA and has been authorized for detection and/or diagnosis of SARS-CoV-2 by FDA under an Emergency Use Authorization (EUA). This EUA will remain in effect (meaning this test can be used) for the duration of the COVID-19 declaration under Section 564(b)(1) of the Act, 21 U.S.C. section 360bbb-3(b)(1), unless the authorization is terminated or revoked.  Performed at [facility], [address] [place], [place] [address]   CBC     Status: Abnormal   Collection Time: 02/04/23 11:40 PM  Result Value Ref Range   WBC 7.5 4.0 - 10.5 K/uL   RBC 3.67 (L) 3.87 - 5.11 MIL/uL   Hemoglobin 8.9 (L) 12.0 - 15.0 g/dL    Comment: Reticulocyte Hemoglobin testing may be clinically indicated, consider ordering this additional test [id number]    HCT 27.2 (L) 36.0 - 46.0 %   MCV 74.1 (L) 80.0 - 100.0 fL   MCH 24.3 (L) 26.0 - 34.0 pg   MCHC 32.7 30.0 - 36.0 g/dL   RDW 82.7 (H) 88.4 - 84.4 %   Platelets 315 150 - 400 K/uL   nRBC 0.0 0.0 - 0.2 %    Comment: Performed at [facility], [address] [place], [place] [address]  CBC with Differential/Platelet     Status: Abnormal   Collection Time: 02/05/23  7:44 AM  Result Value Ref Range   WBC 6.5 4.0 - 10.5 K/uL   RBC 3.74 (L) 3.87 - 5.11 MIL/uL   Hemoglobin 9.0 (L) 12.0 - 15.0 g/dL   HCT 71.2 (L) 63.9 - 53.9 %   MCV 76.7 (L) 80.0 - 100.0 fL   MCH 24.1 (L) 26.0 - 34.0 pg   MCHC 31.4 30.0 - 36.0 g/dL   RDW 82.5 (H) 88.4 - 84.4 %   Platelets 278 150 - 400 K/uL   nRBC 0.0 0.0 - 0.2 %   Neutrophils Relative % 53 %   Neutro Abs 3.4 1.7 - 7.7 K/uL   Lymphocytes Relative 33  %   Lymphs Abs 2.2 0.7 - 4.0 K/uL   Monocytes Relative 11 %   Monocytes Absolute 0.7 0.1 - 1.0 K/uL   Eosinophils Relative 2 %   Eosinophils Absolute 0.1 0.0 - 0.5 K/uL   Basophils Relative 1 %   Basophils Absolute 0.0 0.0 - 0.1 K/uL   Immature Granulocytes  0 %   Abs Immature Granulocytes 0.02 0.00 - 0.07 K/uL    Comment: Performed at [facility], [address] [place], [place] [address]  Basic metabolic panel     Status: Abnormal   Collection Time: 02/05/23  7:44 AM  Result Value Ref Range   Sodium 136 135 - 145 mmol/L   Potassium 3.6 3.5 - 5.1 mmol/L   Chloride 108 98 - 111 mmol/L   CO2 19 (L) 22 - 32 mmol/L   Glucose, Bld 133 (H) 70 - 99 mg/dL    Comment: Glucose reference range applies only to samples taken after fasting for at least 8 hours.   BUN 9 6 - 20 mg/dL   Creatinine, Ser 9.27 0.44 - 1.00 mg/dL   Calcium  7.9 (L) 8.9 - 10.3 mg/dL   GFR, Estimated >39 >39 mL/min    Comment: (NOTE) Calculated using the CKD-EPI Creatinine Equation (2021)    Anion gap 9 5 - 15    Comment: Performed at [facility], [address] [place], [place] [address]  C-reactive protein     Status: Abnormal   Collection Time: 02/05/23  7:44 AM  Result Value Ref Range   CRP 10.3 (H) <1.0 mg/dL    Comment: Performed at [facility], [address] [place], [place] [address]  CBG monitoring, ED     Status: Abnormal   Collection Time: 02/05/23  7:46 AM  Result Value Ref Range   Glucose-Capillary 118 (H) 70 - 99 mg/dL    Comment: Glucose reference range applies only to samples taken after fasting for at least 8 hours.  CBG monitoring, ED     Status: Abnormal   Collection Time: 02/05/23 11:51 AM  Result Value Ref Range   Glucose-Capillary 113 (H) 70 - 99 mg/dL    Comment: Glucose reference range applies only to samples taken after fasting for at least 8 hours.   CT ABDOMEN PELVIS WO CONTRAST Result Date: 02/04/2023 CLINICAL DATA:  Abdominal pain, acute (Ped 0-17y)  pancreatitis?/complications//SBO?. EXAM: CT ABDOMEN AND PELVIS WITHOUT CONTRAST TECHNIQUE: Multidetector CT imaging of the abdomen and pelvis was performed following the standard protocol without IV contrast. RADIATION DOSE REDUCTION: This exam was performed according to the departmental dose-optimization program which includes automated exposure control, adjustment of the mA and/or kV according to patient size and/or use of iterative reconstruction technique. COMPARISON:  CT scan abdomen and pelvis from 11/22/2022. FINDINGS: Lower chest: There is small right pleural effusion which is new since the prior study. No left pleural effusion. The visualized lungs are otherwise clear. The heart is normal in size. No pericardial effusion. There are coronary artery atherosclerotic calcifications, in keeping with coronary artery disease. Hepatobiliary: The liver is normal in size. Non-cirrhotic configuration. No suspicious mass. No intrahepatic or extrahepatic bile duct dilation. Gallbladder is surgically absent. Pancreas: Mild atrophy of the pancreas. There are several dystrophic calcifications throughout the pancreas, nonspecific but commonly seen as a sequela of chronic pancreatitis. No suspicious pancreatic mass seen within the limitations of this unenhanced exam. No peripancreatic fat stranding. Spleen: Within normal limits. No focal lesion. Adrenals/Urinary Tract: Unremarkable left adrenal gland. Right adrenal gland is not well visualized and may have been surgically removed. Correlate with surgical history. No suspicious renal mass within the limitations of this unenhanced exam. No nephroureterolithiasis or obstructive uropathy. Unremarkable urinary bladder. Stomach/Bowel: Redemonstration of a diverticulum arising from the third part of duodenum. No disproportionate dilation of the small or large bowel  loops. No evidence of abnormal bowel wall thickening or inflammatory changes. The appendix was not visualized; however  there is no acute inflammatory process in the right lower quadrant. There are multiple diverticula throughout the colon, without imaging signs of diverticulitis. Vascular/Lymphatic: No ascites or pneumoperitoneum. No abdominal or pelvic lymphadenopathy, by size criteria. No aneurysmal dilation of the major abdominal arteries. There are marked peripheral atherosclerotic vascular calcifications of the aorta and its major branches. Reproductive: The uterus is surgically absent. No large adnexal mass. Other: There is periumbilical surgical scar in small fat containing hernia. There is also irregular hyperattenuating soft tissue along the lower midline anterior abdominal wall, similar to the prior study, likely from prior surgery related fibrosis/scarring. No abnormal collection seen. Musculoskeletal: No suspicious osseous lesions. There are mild multilevel degenerative changes in the visualized spine. IMPRESSION: 1. No acute inflammatory process identified within the abdomen or pelvis. No imaging evidence of acute pancreatitis. No bowel obstruction. 2. New small right pleural effusion. 3. Multiple other nonacute observations, as described above. Aortic Atherosclerosis (ICD10-I70.0). Electronically Signed   By: Ree Molt M.D.   On: 02/04/2023 14:46   DG Chest 1 View Result Date: 02/04/2023 CLINICAL DATA:  Cough and emesis EXAM: CHEST  1 VIEW COMPARISON:  01/31/2023 FINDINGS: Stable cardiomediastinal silhouette. Aortic atherosclerotic calcification. Sternotomy and CABG. No focal consolidation, pleural effusion, or pneumothorax. No displaced rib fractures. IMPRESSION: No active disease. Electronically Signed   By: Norman Gatlin M.D.   On: 02/04/2023 08:56    Pending Labs [employer] (From admission, onward)     Start     Ordered   Signed and Held  Magnesium   Tomorrow morning,   R        Signed and Held   Signed and Held  Comprehensive metabolic panel  Tomorrow morning,   R        Signed and Held    Signed and Held  Comprehensive metabolic panel  Tomorrow morning,   R        Signed and Held   Signed and Held  CBC  Tomorrow morning,   R        Signed and Held            Vitals/Pain Today's Vitals   02/05/23 1115 02/05/23 1200 02/05/23 1215 02/05/23 1230  BP:  111/80    Pulse:  90    Resp:  17  (!) 25  Temp: 98.6 F (37 C)     TempSrc: Oral     SpO2:  100% 99% 99%  Weight:      Height:      PainSc:        Isolation Precautions Airborne and Contact precautions  Medications Medications  ondansetron  (ZOFRAN ) injection 4 mg (4 mg Intravenous Not Given 02/04/23 1135)  0.9 %  sodium chloride  infusion ( Intravenous Rate/Dose Change 02/05/23 0747)  pantoprazole  (PROTONIX ) injection 40 mg (40 mg Intravenous Given 02/05/23 1117)  albuterol  (PROVENTIL ) (2.5 MG/3ML) 0.083% nebulizer solution 2.5 mg (has no administration in time range)  atorvastatin  (LIPITOR ) tablet 80 mg (80 mg Oral Given 02/05/23 1122)  baclofen  (LIORESAL ) tablet 10 mg (10 mg Oral Given 02/05/23 1123)  cyclobenzaprine  (FLEXERIL ) tablet 5 mg (has no administration in time range)  escitalopram  (LEXAPRO ) tablet 20 mg (20 mg Oral Given 02/05/23 1119)  ezetimibe  (ZETIA ) tablet 10 mg (10 mg Oral Given 02/05/23 1124)  gabapentin  (NEURONTIN ) capsule 100 mg (100 mg Oral Given 02/05/23 1125)  LORazepam  (ATIVAN ) tablet 0.5 mg (0.5 mg  Oral Given 02/04/23 2216)  methimazole  (TAPAZOLE ) tablet 5 mg (5 mg Oral Given 02/05/23 1120)  ranolazine  (RANEXA ) 12 hr tablet 500 mg (500 mg Oral Given 02/05/23 1124)  tamsulosin  (FLOMAX ) capsule 0.4 mg (0.4 mg Oral Given 02/04/23 2216)  topiramate  (TOPAMAX ) tablet 25 mg (25 mg Oral Given 02/05/23 1120)  metoprolol  tartrate (LOPRESSOR ) tablet 100 mg (100 mg Oral Not Given 02/05/23 1122)  insulin  glargine-yfgn (SEMGLEE ) injection 10 Units (10 Units Subcutaneous Given 02/04/23 2318)  insulin  aspart (novoLOG ) injection 0-15 Units ( Subcutaneous Not Given 02/05/23 1151)  metoCLOPramide  (REGLAN ) injection 5 mg (5 mg  Intravenous Given 02/05/23 0549)  nirmatrelvir /ritonavir  (PAXLOVID ) 3 tablet (has no administration in time range)  folic acid  (FOLVITE ) tablet 1 mg (1 mg Oral Given 02/05/23 1118)  multivitamin with minerals tablet 1 tablet (1 tablet Oral Given 02/05/23 1125)  ipratropium-albuterol  (DUONEB) 0.5-2.5 (3) MG/3ML nebulizer solution 3 mL (3 mLs Inhalation Given 02/05/23 0841)  fluticasone  (FLONASE ) 50 MCG/ACT nasal spray 2 spray (2 sprays Each Nare Not Given 02/05/23 1241)  loratadine  (CLARITIN ) tablet 10 mg (10 mg Oral Given 02/05/23 1149)  ondansetron  (ZOFRAN -ODT) disintegrating tablet 4 mg (4 mg Oral Given 02/04/23 1137)  pantoprazole  (PROTONIX ) injection 80 mg (80 mg Intravenous Given 02/04/23 1505)  lactated ringers  bolus 1,000 mL (0 mLs Intravenous Stopped 02/04/23 1657)  sodium chloride  0.9 % bolus 1,000 mL (0 mLs Intravenous Stopped 02/04/23 1831)  alum & mag hydroxide-simeth (MAALOX/MYLANTA) 200-200-20 MG/5ML suspension 30 mL (30 mLs Oral Given 02/04/23 1759)    And  lidocaine  (XYLOCAINE ) 2 % viscous mouth solution 15 mL (15 mLs Oral Given 02/04/23 1758)    Mobility power wheelchair     Focused Assessments GI   R Recommendations: See Admitting Provider Note  Report given to:   Additional Notes:

## 2023-02-05 NOTE — Plan of Care (Signed)
 Patient is alert and  oriented x 4. Continue on room air. Denies chest pain, chest pressure or sob. No acute distress.  Afebrile.  Patient oriented to room, and call light, verbalized understanding.  Continue on isolation for  positive covid, and  ESBL. Continue on iv infusion as order. Bed lock in lowest position.  Bed alarm initiated. Problem: Education: Goal: Ability to describe self-care measures that may prevent or decrease complications (Diabetes Survival Skills Education) will improve Outcome: Not Progressing Goal: Individualized Educational Video(s) Outcome: Not Progressing   Problem: Coping: Goal: Ability to adjust to condition or change in health will improve Outcome: Not Progressing   Problem: Fluid Volume: Goal: Ability to maintain a balanced intake and output will improve Outcome: Not Progressing   Problem: Health Behavior/Discharge Planning: Goal: Ability to identify and utilize available resources and services will improve Outcome: Not Progressing Goal: Ability to manage health-related needs will improve Outcome: Not Progressing   Problem: Metabolic: Goal: Ability to maintain appropriate glucose levels will improve Outcome: Not Progressing   Problem: Nutritional: Goal: Maintenance of adequate nutrition will improve Outcome: Not Progressing Goal: Progress toward achieving an optimal weight will improve Outcome: Not Progressing   Problem: Skin Integrity: Goal: Risk for impaired skin integrity will decrease Outcome: Not Progressing   Problem: Tissue Perfusion: Goal: Adequacy of tissue perfusion will improve Outcome: Not Progressing   Problem: Education: Goal: Knowledge of risk factors and measures for prevention of condition will improve Outcome: Not Progressing   Problem: Coping: Goal: Psychosocial and spiritual needs will be supported Outcome: Not Progressing   Problem: Respiratory: Goal: Will maintain a patent airway Outcome: Not Progressing Goal:  Complications related to the disease process, condition or treatment will be avoided or minimized Outcome: Not Progressing   Problem: Education: Goal: Knowledge of General Education information will improve Description: Including pain rating scale, medication(s)/side effects and non-pharmacologic comfort measures Outcome: Not Progressing   Problem: Health Behavior/Discharge Planning: Goal: Ability to manage health-related needs will improve Outcome: Not Progressing   Problem: Clinical Measurements: Goal: Ability to maintain clinical measurements within normal limits will improve Outcome: Not Progressing Goal: Will remain free from infection Outcome: Not Progressing Goal: Diagnostic test results will improve Outcome: Not Progressing Goal: Respiratory complications will improve Outcome: Not Progressing Goal: Cardiovascular complication will be avoided Outcome: Not Progressing   Problem: Activity: Goal: Risk for activity intolerance will decrease Outcome: Not Progressing   Problem: Nutrition: Goal: Adequate nutrition will be maintained Outcome: Not Progressing   Problem: Coping: Goal: Level of anxiety will decrease Outcome: Not Progressing   Problem: Elimination: Goal: Will not experience complications related to bowel motility Outcome: Not Progressing Goal: Will not experience complications related to urinary retention Outcome: Not Progressing   Problem: Pain Management: Goal: General experience of comfort will improve Outcome: Not Progressing   Problem: Safety: Goal: Ability to remain free from injury will improve Outcome: Not Progressing   Problem: Skin Integrity: Goal: Risk for impaired skin integrity will decrease Outcome: Not Progressing

## 2023-02-05 NOTE — H&P (View-Only) (Signed)
 [facility] Subjective: Patient is a 53 year old female with multiple medical problems including gastroparesis CAD status post CABG on Plavix  along with history of chronic diastolic heart failure sleep apnea and a history of DVT on Xarelto , admitted with coffee-ground emesis.  Patient has had not had any problems with CGE through the night as per the nursing staff.  Patient seems to understand my questions but did not respond verbally to any other questions I asked her.  She gives a history of abdominal pain in the periumbilical area her hemoglobin is stable at 9 g/dL today. Objective: Vital signs in last 24 hours: Temp:  [98.1 F (36.7 C)-99.6 F (37.6 C)] 98.6 F (37 C) (01/01 0744) Pulse Rate:  [41-123] 89 (01/01 0800) Resp:  [9-36] 9 (01/01 0800) BP: (91-129)/(58-91) 97/70 (01/01 0800) SpO2:  [96 %-100 %] 97 % (01/01 0800)    Intake/Output from previous day: 12/31 0701 - 01/01 0700 In: 2000 [IV Piggyback:2000] Out: -  Intake/Output this shift: No intake/output data recorded.  General appearance: appears stated age, fatigued, no distress, and slowed mentation Resp: clear to auscultation bilaterally Cardio: regular rate and rhythm, S1, S2 normal, no murmur, click, rub or gallop GI: soft, non-tender; bowel sounds normal; no masses,  no organomegaly  Lab Results: Recent Labs    02/04/23 0758 02/04/23 1112 02/04/23 1113 02/04/23 2340 02/05/23 0744  WBC 9.2  --   --  7.5 6.5  HGB 10.5*   < > 10.9* 8.9* 9.0*  HCT 32.1*   < > 32.0* 27.2* 28.7*  PLT 361  --   --  315 278   < > = values in this interval not displayed.   BMET Recent Labs    02/04/23 0758 02/04/23 1112 02/04/23 1113 02/05/23 0744  NA 134* 134* 134* 136  K 3.8 3.5 3.5 3.6  CL 100 101  --  108  CO2 21*  --   --  19*  GLUCOSE 281* 309*  --  133*  BUN 11 11  --  9  CREATININE 0.96 0.80  --  0.72  CALCIUM  8.9  --   --  7.9*   LFT Recent Labs    02/04/23 0758  PROT 7.3  ALBUMIN  3.2*  AST 17   ALT 16  ALKPHOS 63  BILITOT 0.6   PT/INR No results for input(s): LABPROT, INR in the last 72 hours. Hepatitis Panel No results for input(s): HEPBSAG, HCVAB, HEPAIGM, HEPBIGM in the last 72 hours. C-Diff No results for input(s): CDIFFTOX in the last 72 hours. No results for input(s): CDIFFPCR in the last 72 hours. Fecal Lactopherrin No results for input(s): FECLLACTOFRN in the last 72 hours.  Studies/Results: CT ABDOMEN PELVIS WO CONTRAST Result Date: 02/04/2023 CLINICAL DATA:  Abdominal pain, acute (Ped 0-17y) pancreatitis?/complications//SBO?. EXAM: CT ABDOMEN AND PELVIS WITHOUT CONTRAST TECHNIQUE: Multidetector CT imaging of the abdomen and pelvis was performed following the standard protocol without IV contrast. RADIATION DOSE REDUCTION: This exam was performed according to the departmental dose-optimization program which includes automated exposure control, adjustment of the mA and/or kV according to patient size and/or use of iterative reconstruction technique. COMPARISON:  CT scan abdomen and pelvis from 11/22/2022. FINDINGS: Lower chest: There is small right pleural effusion which is new since the prior study. No left pleural effusion. The visualized lungs are otherwise clear. The heart is normal in size. No pericardial effusion. There are coronary artery atherosclerotic calcifications, in keeping with coronary artery disease. Hepatobiliary: The liver is normal in size. Non-cirrhotic  configuration. No suspicious mass. No intrahepatic or extrahepatic bile duct dilation. Gallbladder is surgically absent. Pancreas: Mild atrophy of the pancreas. There are several dystrophic calcifications throughout the pancreas, nonspecific but commonly seen as a sequela of chronic pancreatitis. No suspicious pancreatic mass seen within the limitations of this unenhanced exam. No peripancreatic fat stranding. Spleen: Within normal limits. No focal lesion. Adrenals/Urinary Tract: Unremarkable  left adrenal gland. Right adrenal gland is not well visualized and may have been surgically removed. Correlate with surgical history. No suspicious renal mass within the limitations of this unenhanced exam. No nephroureterolithiasis or obstructive uropathy. Unremarkable urinary bladder. Stomach/Bowel: Redemonstration of a diverticulum arising from the third part of duodenum. No disproportionate dilation of the small or large bowel loops. No evidence of abnormal bowel wall thickening or inflammatory changes. The appendix was not visualized; however there is no acute inflammatory process in the right lower quadrant. There are multiple diverticula throughout the colon, without imaging signs of diverticulitis. Vascular/Lymphatic: No ascites or pneumoperitoneum. No abdominal or pelvic lymphadenopathy, by size criteria. No aneurysmal dilation of the major abdominal arteries. There are marked peripheral atherosclerotic vascular calcifications of the aorta and its major branches. Reproductive: The uterus is surgically absent. No large adnexal mass. Other: There is periumbilical surgical scar in small fat containing hernia. There is also irregular hyperattenuating soft tissue along the lower midline anterior abdominal wall, similar to the prior study, likely from prior surgery related fibrosis/scarring. No abnormal collection seen. Musculoskeletal: No suspicious osseous lesions. There are mild multilevel degenerative changes in the visualized spine. IMPRESSION: 1. No acute inflammatory process identified within the abdomen or pelvis. No imaging evidence of acute pancreatitis. No bowel obstruction. 2. New small right pleural effusion. 3. Multiple other nonacute observations, as described above. Aortic Atherosclerosis (ICD10-I70.0). Electronically Signed   By: Ree Molt M.D.   On: 02/04/2023 14:46   DG Chest 1 View Result Date: 02/04/2023 CLINICAL DATA:  Cough and emesis EXAM: CHEST  1 VIEW COMPARISON:  01/31/2023  FINDINGS: Stable cardiomediastinal silhouette. Aortic atherosclerotic calcification. Sternotomy and CABG. No focal consolidation, pleural effusion, or pneumothorax. No displaced rib fractures. IMPRESSION: No active disease. Electronically Signed   By: Norman Gatlin M.D.   On: 02/04/2023 08:56    Medications: I have reviewed the patient's current medications. Prior to Admission: (Not in a hospital admission)  Scheduled:  atorvastatin   80 mg Oral Daily   baclofen   10 mg Oral TID   escitalopram   20 mg Oral q morning   ezetimibe   10 mg Oral Daily   folic acid   1 mg Oral Daily   gabapentin   100 mg Oral TID   insulin  aspart  0-15 Units Subcutaneous TID WC   insulin  glargine-yfgn  10 Units Subcutaneous QHS   ipratropium-albuterol   3 mL Inhalation Q6H   LORazepam   0.5 mg Oral QHS   methimazole   5 mg Oral q morning   metoCLOPramide  (REGLAN ) injection  5 mg Intravenous Q8H   metoprolol  tartrate  100 mg Oral BID   multivitamin with minerals  1 tablet Oral Daily   nirmatrelvir /ritonavir   3 tablet Oral BID   ondansetron  (ZOFRAN ) IV  4 mg Intravenous Once   pantoprazole  (PROTONIX ) IV  40 mg Intravenous Q12H   ranolazine   500 mg Oral BID   tamsulosin   0.4 mg Oral QHS   topiramate   25 mg Oral BID   Continuous:  sodium chloride  125 mL/hr at 02/05/23 0747   PRN:albuterol , cyclobenzaprine   Assessment/Plan: 1) Coffee-ground emesis with chronic microcytic anemia  on Plavix  and Xarelto -normal BUN/ GERD-as her hemoglobin is stable and she seems stable from a GI standpoint her EGD will be postponed till tomorrow to be done by Dr. Leigh. 2) Gastroparesis-on Reglan  at home. 3) Chronic constipation with occasional rectal bleeding. 4) Abnormal CT scan-mild pancreatic atrophy with calcifications of the pancreas-?  chronic pancreatitis. 5) Hyperthyroidism on Tapazole . 6) Chronic diastolic heart failure. 7) Obstructive sleep apnea 8) History of a pheochromocytoma status post right adrenalectomy. 5)    LOS: 0 days   Renaye Sous 02/05/2023, 9:46 AM

## 2023-02-05 NOTE — Progress Notes (Signed)
 RT called for PRN albuterol treatment. Patient is refusing treatment. States she is just congested. Lungs were clear/diminished with sats of 98% on room air.

## 2023-02-06 ENCOUNTER — Inpatient Hospital Stay (HOSPITAL_COMMUNITY): Payer: 59 | Admitting: Anesthesiology

## 2023-02-06 ENCOUNTER — Encounter (HOSPITAL_COMMUNITY)
Admission: EM | Disposition: A | Discharge status: Skilled Nursing Facility | Payer: Self-pay | Source: Home / Self Care | Attending: Internal Medicine

## 2023-02-06 ENCOUNTER — Encounter (HOSPITAL_COMMUNITY): Payer: Self-pay | Admitting: Internal Medicine

## 2023-02-06 ENCOUNTER — Ambulatory Visit: Payer: 59 | Admitting: Family

## 2023-02-06 DIAGNOSIS — K2980 Duodenitis without bleeding: Secondary | ICD-10-CM

## 2023-02-06 DIAGNOSIS — K5710 Diverticulosis of small intestine without perforation or abscess without bleeding: Secondary | ICD-10-CM | POA: Diagnosis not present

## 2023-02-06 DIAGNOSIS — K2091 Esophagitis, unspecified with bleeding: Secondary | ICD-10-CM | POA: Diagnosis not present

## 2023-02-06 DIAGNOSIS — K317 Polyp of stomach and duodenum: Secondary | ICD-10-CM | POA: Diagnosis not present

## 2023-02-06 DIAGNOSIS — Z7901 Long term (current) use of anticoagulants: Secondary | ICD-10-CM | POA: Diagnosis not present

## 2023-02-06 DIAGNOSIS — K920 Hematemesis: Secondary | ICD-10-CM | POA: Diagnosis not present

## 2023-02-06 DIAGNOSIS — I2510 Atherosclerotic heart disease of native coronary artery without angina pectoris: Secondary | ICD-10-CM

## 2023-02-06 DIAGNOSIS — K209 Esophagitis, unspecified without bleeding: Secondary | ICD-10-CM

## 2023-02-06 DIAGNOSIS — I10 Essential (primary) hypertension: Secondary | ICD-10-CM

## 2023-02-06 DIAGNOSIS — E872 Acidosis, unspecified: Secondary | ICD-10-CM | POA: Insufficient documentation

## 2023-02-06 DIAGNOSIS — K449 Diaphragmatic hernia without obstruction or gangrene: Secondary | ICD-10-CM

## 2023-02-06 DIAGNOSIS — R1310 Dysphagia, unspecified: Secondary | ICD-10-CM | POA: Diagnosis not present

## 2023-02-06 DIAGNOSIS — D132 Benign neoplasm of duodenum: Secondary | ICD-10-CM

## 2023-02-06 DIAGNOSIS — K3189 Other diseases of stomach and duodenum: Secondary | ICD-10-CM

## 2023-02-06 HISTORY — PX: ESOPHAGOGASTRODUODENOSCOPY (EGD) WITH PROPOFOL: SHX5813

## 2023-02-06 HISTORY — PX: BIOPSY: SHX5522

## 2023-02-06 LAB — C-REACTIVE PROTEIN: CRP: 8.1 mg/dL — ABNORMAL HIGH (ref <1.0)

## 2023-02-06 LAB — MAGNESIUM: Magnesium: 1.6 mg/dL — ABNORMAL LOW (ref 1.7–2.4)

## 2023-02-06 LAB — CBC WITH DIFFERENTIAL/PLATELET
Abs Immature Granulocytes: 0.01 10*3/uL (ref 0.00–0.07)
Basophils Absolute: 0 10*3/uL (ref 0.0–0.1)
Basophils Relative: 1 %
Eosinophils Absolute: 0.2 10*3/uL (ref 0.0–0.5)
Eosinophils Relative: 3 %
HCT: 26.9 % — ABNORMAL LOW (ref 36.0–46.0)
Hemoglobin: 8.3 g/dL — ABNORMAL LOW (ref 12.0–15.0)
Immature Granulocytes: 0 %
Lymphocytes Relative: 35 %
Lymphs Abs: 1.9 10*3/uL (ref 0.7–4.0)
MCH: 23.9 pg — ABNORMAL LOW (ref 26.0–34.0)
MCHC: 30.9 g/dL (ref 30.0–36.0)
MCV: 77.5 fL — ABNORMAL LOW (ref 80.0–100.0)
Monocytes Absolute: 0.5 10*3/uL (ref 0.1–1.0)
Monocytes Relative: 9 %
Neutro Abs: 2.8 10*3/uL (ref 1.7–7.7)
Neutrophils Relative %: 52 %
Platelets: 294 10*3/uL (ref 150–400)
RBC: 3.47 MIL/uL — ABNORMAL LOW (ref 3.87–5.11)
RDW: 17.4 % — ABNORMAL HIGH (ref 11.5–15.5)
WBC: 5.4 10*3/uL (ref 4.0–10.5)
nRBC: 0 % (ref 0.0–0.2)

## 2023-02-06 LAB — BASIC METABOLIC PANEL
Anion gap: 9 (ref 5–15)
BUN: 7 mg/dL (ref 6–20)
CO2: 15 mmol/L — ABNORMAL LOW (ref 22–32)
Calcium: 7.8 mg/dL — ABNORMAL LOW (ref 8.9–10.3)
Chloride: 116 mmol/L — ABNORMAL HIGH (ref 98–111)
Creatinine, Ser: 0.77 mg/dL (ref 0.44–1.00)
GFR, Estimated: >60 mL/min (ref >60)
Glucose, Bld: 86 mg/dL (ref 70–99)
Potassium: 3.8 mmol/L (ref 3.5–5.1)
Sodium: 140 mmol/L (ref 135–145)

## 2023-02-06 LAB — GLUCOSE, CAPILLARY
Glucose-Capillary: 77 mg/dL (ref 70–99)
Glucose-Capillary: 83 mg/dL (ref 70–99)
Glucose-Capillary: 121 mg/dL — ABNORMAL HIGH (ref 70–99)
Glucose-Capillary: 124 mg/dL — ABNORMAL HIGH (ref 70–99)
Glucose-Capillary: 129 mg/dL — ABNORMAL HIGH (ref 70–99)

## 2023-02-06 LAB — PREPARE RBC (CROSSMATCH)

## 2023-02-06 SURGERY — ESOPHAGOGASTRODUODENOSCOPY (EGD) WITH PROPOFOL
Anesthesia: General

## 2023-02-06 MED ORDER — POLYETHYLENE GLYCOL 3350 17 G PO PACK
17.0000 g | PACK | Freq: Every day | ORAL | Status: DC
  Administered 2023-02-06 – 2023-02-11 (×6): 17 g via ORAL
  Filled 2023-02-06 (×7): qty 1

## 2023-02-06 MED ORDER — OXYCODONE HCL 5 MG PO TABS
5.0000 mg | ORAL_TABLET | ORAL | Status: DC | PRN
  Administered 2023-02-07 – 2023-02-10 (×5): 5 mg via ORAL
  Filled 2023-02-06 (×5): qty 1

## 2023-02-06 MED ORDER — EPHEDRINE SULFATE (PRESSORS) 50 MG/ML IJ SOLN
INTRAMUSCULAR | Status: DC | PRN
Start: 2023-02-06 — End: 2023-02-06
  Administered 2023-02-06 (×2): 5 mg via INTRAVENOUS

## 2023-02-06 MED ORDER — LIDOCAINE 2% (20 MG/ML) 5 ML SYRINGE
INTRAMUSCULAR | Status: DC | PRN
  Administered 2023-02-06: 100 mg via INTRAVENOUS

## 2023-02-06 MED ORDER — ONDANSETRON HCL 4 MG/2ML IJ SOLN
INTRAMUSCULAR | Status: DC | PRN
  Administered 2023-02-06: 4 mg via INTRAVENOUS

## 2023-02-06 MED ORDER — ONDANSETRON HCL 4 MG PO TABS: 4.0000 mg | ORAL_TABLET | Freq: Four times a day (QID) | ORAL | Status: DC | PRN

## 2023-02-06 MED ORDER — ALBUMIN HUMAN 5 % IV SOLN: INTRAVENOUS | Status: DC | PRN

## 2023-02-06 MED ORDER — PROPOFOL 10 MG/ML IV BOLUS
INTRAVENOUS | Status: DC | PRN
  Administered 2023-02-06: 130 mg via INTRAVENOUS

## 2023-02-06 MED ORDER — PHENYLEPHRINE 80 MCG/ML (10ML) SYRINGE FOR IV PUSH (FOR BLOOD PRESSURE SUPPORT)
PREFILLED_SYRINGE | INTRAVENOUS | Status: DC | PRN
  Administered 2023-02-06: 160 ug via INTRAVENOUS
  Administered 2023-02-06: 80 ug via INTRAVENOUS
  Administered 2023-02-06 (×2): 160 ug via INTRAVENOUS

## 2023-02-06 MED ORDER — ONDANSETRON HCL 4 MG/2ML IJ SOLN: 4.0000 mg | Freq: Four times a day (QID) | INTRAMUSCULAR | Status: DC | PRN

## 2023-02-06 MED ORDER — SORBITOL 70 % SOLN: 30.0000 mL | Freq: Every day | Status: DC | PRN

## 2023-02-06 MED ORDER — PHENYLEPHRINE HCL-NACL 20-0.9 MG/250ML-% IV SOLN
INTRAVENOUS | Status: DC | PRN
  Administered 2023-02-06: 15 ug/min via INTRAVENOUS

## 2023-02-06 MED ORDER — MAGNESIUM SULFATE 4 GM/100ML IV SOLN
4.0000 g | Freq: Once | INTRAVENOUS | Status: DC
  Filled 2023-02-06: qty 100

## 2023-02-06 MED ORDER — FENTANYL CITRATE (PF) 100 MCG/2ML IJ SOLN
INTRAMUSCULAR | Status: DC | PRN
Start: 2023-02-06 — End: 2023-02-06
  Administered 2023-02-06: 50 ug via INTRAVENOUS

## 2023-02-06 MED ORDER — ACETAMINOPHEN 650 MG RE SUPP: 650.0000 mg | Freq: Four times a day (QID) | RECTAL | Status: DC | PRN

## 2023-02-06 MED ORDER — FENTANYL CITRATE (PF) 100 MCG/2ML IJ SOLN
INTRAMUSCULAR | Status: AC
  Filled 2023-02-06: qty 2

## 2023-02-06 MED ORDER — SUCCINYLCHOLINE CHLORIDE 200 MG/10ML IV SOSY
PREFILLED_SYRINGE | INTRAVENOUS | Status: DC | PRN
  Administered 2023-02-06: 100 mg via INTRAVENOUS

## 2023-02-06 MED ORDER — ACETAMINOPHEN 325 MG PO TABS
650.0000 mg | ORAL_TABLET | Freq: Four times a day (QID) | ORAL | Status: DC | PRN
  Administered 2023-02-11: 650 mg via ORAL
  Filled 2023-02-06: qty 2

## 2023-02-06 MED ORDER — HYDRALAZINE HCL 20 MG/ML IJ SOLN: 5.0000 mg | Freq: Four times a day (QID) | INTRAMUSCULAR | Status: DC | PRN

## 2023-02-06 MED ORDER — SODIUM CHLORIDE 0.9% FLUSH
3.0000 mL | Freq: Two times a day (BID) | INTRAVENOUS | Status: DC
  Administered 2023-02-06 – 2023-02-13 (×15): 3 mL via INTRAVENOUS

## 2023-02-06 MED ORDER — SODIUM CHLORIDE 0.9% IV SOLUTION: Freq: Once | INTRAVENOUS | Status: AC

## 2023-02-06 MED ORDER — SODIUM BICARBONATE 8.4 % IV SOLN
INTRAVENOUS | Status: DC
  Filled 2023-02-06 (×2): qty 1000

## 2023-02-06 MED ORDER — STERILE WATER FOR INJECTION IV SOLN: INTRAVENOUS | Status: DC

## 2023-02-06 SURGICAL SUPPLY — 14 items

## 2023-02-06 NOTE — Evaluation (Addendum)
 Clinical/Bedside Swallow Evaluation Patient Details  Name: Lori [name] MRN: [id number] Date of Birth: 1970/03/02  Today's Date: 02/06/2023 Time: SLP Start Time (ACUTE ONLY): 1527 SLP Stop Time (ACUTE ONLY): 1540 SLP Time Calculation (min) (ACUTE ONLY): 13 min  Past Medical History:  Past Medical History:  Diagnosis Date   CAD (coronary artery disease)    Depression    Diabetes mellitus without complication (HCC)    History of CT scan    History of mammogram    History of MRI    Hypertension    Pheochromocytoma    Reversible cerebrovascular vasoconstriction syndrome    Stroke (HCC)    Thyroid  disease    Past Surgical History:  Past Surgical History:  Procedure Laterality Date   ABDOMINAL HYSTERECTOMY     CESAREAN SECTION     3   CORONARY ARTERY BYPASS GRAFT     Right adrenal gland removal for pheochromocytoma Right    HPI:  Lori [name] is a 53 yo female presenting to ED 12/31 with one day history of nausea and coffee-ground emesis with decreased oral intake. Underwent EGD 1/2 with notable R nare epistaxis. Most recently seen by SLP 12/03/22 with recommendations for a Dys 3 texture diet with thin liquids and increased assistance to order. PMH includes T2DM, gastroparesis, CAD s/p CABG, CHF, OSA on CPAP, history of CVA with residual L sided weakness and currently bedbound, history of DVT on Xarelto , pheochromocytoma s/p R adrenalectomy, hypothyroidism    Assessment / Plan / Recommendation  Clinical Impression  Pt known to this SLP from previous admission. She reports no acute difficulties with swallowing and states she wants to return to solid food. Oral motor exam significant for edentulism. All trials were [facility] with adequate mastication and oral clearance with clear liquid diet. No s/s of aspiration noted throughout assessment. Discussed options for diet modification with pt once she is cleared for a solid diet and she endorses preference for small,  bite-sized pieces of solids. This aligns with most recent SLP recommendations. Recommend upgrading diet to Dys 3 solids with thin liquids once appropriate for diet advancement per MD.  Addendum 1610: Per MD, okay to advance diet. Recommend upgrading to Dys 3 solid diet with thin liquids. Pt will benefit from assistance with ordering softer solids from the menu and set-up assistance. No further SLP f/u is needed at this time. Will s/o.   SLP Visit Diagnosis: Dysphagia, unspecified (R13.10)    Aspiration Risk  Mild aspiration risk    Diet Recommendation Thin liquid    Liquid Administration via: Cup;Straw;Spoon Medication Administration: Whole meds with liquid Supervision: Patient able to self feed;Intermittent supervision to cue for compensatory strategies Compensations: Minimize environmental distractions;Slow rate;Small sips/bites;Monitor for anterior loss Postural Changes: Seated upright at 90 degrees    Other  Recommendations Oral Care Recommendations: Oral care BID    Recommendations for follow up therapy are one component of a multi-disciplinary discharge planning process, led by the attending physician.  Recommendations may be updated based on patient status, additional functional criteria and insurance authorization.  Follow up Recommendations No SLP follow up      Assistance Recommended at Discharge    Functional Status Assessment Patient has not had a recent decline in their functional status  Frequency and Duration            Prognosis Prognosis for improved oropharyngeal function: Good Barriers to Reach Goals: Time post onset      Swallow Study   General HPI: Lori [name] is a 53 yo female presenting to ED 12/31 with one day history of nausea and coffee-ground emesis with decreased oral intake. Underwent EGD 1/2 with notable R nare epistaxis. Most recently seen by SLP 12/03/22 with recommendations for a Dys 3 texture diet with thin liquids and increased  assistance to order. PMH includes T2DM, gastroparesis, CAD s/p CABG, CHF, OSA on CPAP, history of CVA with residual L sided weakness and currently bedbound, history of DVT on Xarelto , pheochromocytoma s/p R adrenalectomy, hypothyroidism Type of Study: Bedside Swallow Evaluation Previous Swallow Assessment: see HPI Diet Prior to this Study: Clear liquid diet Temperature Spikes Noted: No Respiratory Status: Room air History of Recent Intubation: No Behavior/Cognition: Alert;Cooperative;Pleasant mood Oral Cavity Assessment: Within Functional Limits Oral Care Completed by SLP: No Oral Cavity - Dentition: Edentulous Vision: Functional for self-feeding Self-Feeding Abilities: Needs assist;Able to feed self Patient Positioning: Upright in bed Baseline Vocal Quality: Normal Volitional Cough: Strong Volitional Swallow: Able to elicit    Oral/Motor/Sensory Function Overall Oral Motor/Sensory Function: Within functional limits   Ice Chips Ice chips: Not tested   Thin Liquid Thin Liquid: Within functional limits Presentation: Straw;Spoon;Self Fed    Nectar Thick Nectar Thick Liquid: Not tested   Honey Thick Honey Thick Liquid: Not tested   Puree Puree: Within functional limits Presentation: Spoon;Self Fed   Solid     Solid: Not tested      Damien Blumenthal, M.A., CF-SLP Speech Language Pathology, Acute Rehabilitation Services  Secure Chat preferred [phone]  02/06/2023,4:04 PM

## 2023-02-06 NOTE — Anesthesia Procedure Notes (Signed)
 Procedure Name: Intubation Date/Time: 02/06/2023 9:56 AM  Performed by: Lamar Lucie DASEN, CRNAPre-anesthesia Checklist: Patient identified, Emergency Drugs available, Suction available and Patient being monitored Patient Re-evaluated:Patient Re-evaluated prior to induction Oxygen Delivery Method: Circle system utilized Preoxygenation: Pre-oxygenation with 100% oxygen Induction Type: IV induction Ventilation: Mask ventilation without difficulty Laryngoscope Size: Glidescope and 3 Grade View: Grade I Tube type: Oral Tube size: 7.0 mm Number of attempts: 1 Airway Equipment and Method: Rigid stylet and Video-laryngoscopy Placement Confirmation: ETT inserted through vocal cords under direct vision, positive ETCO2 and breath sounds checked- equal and bilateral Secured at: 21 cm Tube secured with: Tape Dental Injury: Teeth and Oropharynx as per pre-operative assessment

## 2023-02-06 NOTE — Op Note (Addendum)
 [facility] Patient Name: Lori [name] Procedure Date : 02/06/2023 MRN: [id number] Attending MD: Elspeth SQUIBB. Leigh , MD, [id number] Date of Birth: 1970/02/12 CSN: [id number] Age: 53 Admit Type: Inpatient Procedure:                Upper GI endoscopy Indications:              Hematemesis, Nausea with vomiting. On Xarelto  AND                            Plavix  - both held for past 2 days. Reported                            history of gastroparesis and iron  deficiency but no                            prior endoscopy. On Reglan . Patient electively                            intubated for this case per anesthesia given COVID                            dx Providers:                Elspeth P. Leigh, MD, Particia Fischer, RN, Burnard Fire RN, RN, Felice Sar, Technician, Fairy Marina, Technician Referring MD:              Medicines:                Monitored Anesthesia Care Complications:            No immediate complications. Estimated blood loss:                            Minimal. Estimated Blood Loss:     Estimated blood loss was minimal. Procedure:                Pre-Anesthesia Assessment:                           - Prior to the procedure, a History and Physical                            was performed, and patient medications and                            allergies were reviewed. The patient's tolerance of                            previous anesthesia was also reviewed. The risks  and benefits of the procedure and the sedation                            options and risks were discussed with the patient.                            All questions were answered, and informed consent                            was obtained. Prior Anticoagulants: The patient has                            taken Plavix  (clopidogrel ), last dose was 2 days                            prior to procedure.  ASA Grade Assessment: IV - A                            patient with severe systemic disease that is a                            constant threat to life. After reviewing the risks                            and benefits, the patient was deemed in                            satisfactory condition to undergo the procedure.                           After obtaining informed consent, the endoscope was                            passed under direct vision. Throughout the                            procedure, the patient's blood pressure, pulse, and                            oxygen saturations were monitored continuously. The                            [id number]-H190 ([id number] Olympus endoscope was introduced                            through the mouth, and advanced to the second part                            of duodenum. The upper GI endoscopy was                            accomplished without difficulty. The patient  tolerated the procedure well. Scope In: Scope Out: Findings:      Esophagogastric landmarks were identified: the Z-line was found at 34       cm, the gastroesophageal junction was found at 34 cm and the upper       extent of the gastric folds was found at 35 cm from the incisors.      A 1 cm hiatal hernia was present.      LA Grade C esophagitis with no bleeding was found 28 to 34 cm from the       incisors.      The exam of the esophagus was otherwise normal.      A single 5 mm sessile polyp was found in the gastric body. Not removed       given PLavix  taken 2 days ago.      There was some benign mottled appearance of the proximal stomach. The       exam of the stomach was otherwise normal.      Biopsies were taken with a cold forceps for Helicobacter pylori testing.      A diverticulum was found in the second portion of the duodenum.      A single 4 mm sessile polyp was found in the second portion of the       duodenum, not far away from normal  appearing ampulla. Biopsies were       taken with a cold forceps for histology to rule out adenomatous change.       Positioning was quite challenging, only able to take a small biopsy /       superficial. If removal is needed at some point, would be best to do       with side viewing endoscope.      The exam of the duodenum was otherwise normal. Impression:               - Esophagogastric landmarks identified.                           - 1 cm hiatal hernia.                           - LA Grade C esophagitis                           - Normal esophagus otherwise.                           - A single gastric polyp.                           - Benign mottled appearance of proximal stomach                           - Normal stomach otherwise                           - Duodenal diverticulum.                           - A single duodenal polyp. Biopsied.                           -  Normal small bowel otherwise.                           No high risk pathology to cause bleeding, which is                            likely due to esophagitis in the setting of nausea                            / vomiting. There is NO evidence of pathology or                            gastric outlet obstruction to cause her nausea /                            vomiting. I do think dx of gastroparesis is correct                            in the setting of DM. She should be on protonix                             40mg  BID for 6 weeks and then once daily                            thereafter, if gastroparesis symptoms persist.                            Continue Reglan  if that has helped. Consider                            transition to [place]  as outpatient if covered by                            insurance and still having symptoms despite Reglan . Recommendation:           - Return patient to hospital ward for ongoing care.                           - Advance diet as tolerated.                           - Continue  present medications.                           - Protonix  40mg  twice daily for 6 weeks and then                            once daily thereafter                           - Continue Reglan                            - Await pathology results.                           -  Repeat EGD in a few months as outpatient, assess                            for mucosal healing, rule out Barrett's, and                            removal of gastric / duodenal polyps if needed                           - Resume Plavix  / Xarelto  tomorrow                           - We will sign off at this time. Can coordinate                            outpatient follow up with our office and relay                            pathology results to her after discharge from the                            hospital                           ADDENDUM: While recovering in unit during                            extubation, patient had hypotension in the setting                            of nose bleed. No intervention was performed by me                            or anesthesia in her nares, this appears to be                            spontaneous bleeding from her nose. Nare packed                            with gauze per anesthesia, patient extubated, will                            contact primary service / ENT to further evaluation Procedure Code(s):        --- Professional ---                           [phone], Esophagogastroduodenoscopy, flexible,                            transoral; with biopsy, single or multiple Diagnosis Code(s):        --- Professional ---                           K44.9, Diaphragmatic hernia  without obstruction or                            gangrene                           K20.90, Esophagitis, unspecified without bleeding                           K31.7, Polyp of stomach and duodenum                           K92.0, Hematemesis                           R11.2, Nausea with vomiting, unspecified                            K57.10, Diverticulosis of small intestine without                            perforation or abscess without bleeding CPT copyright 2022 American Medical Association. All rights reserved. The codes documented in this report are preliminary and upon coder review may  be revised to meet current compliance requirements. Elspeth P. Leigh, MD 02/06/2023 10:26:41 AM This report has been signed electronically. Number of Addenda: 0

## 2023-02-06 NOTE — Progress Notes (Signed)
 Patient has taken for the procedure around 0930 AM

## 2023-02-06 NOTE — Interval H&P Note (Signed)
 History and Physical Interval Note: Patient has not had any further bleeding. She is stable at this time. Off Xarelto  at least 2 days and Plavix  as well. EGD to clarify causes for her bleeding symptoms and nausea / vomiting / gastroparesis dx. She has tested positive for COVID. Respiratory precautions taken in endo unit for this exam. Following discussion of risks /benefits she wishes to proceed. Further recommendations pending results.   02/06/2023 9:50 AM  Lori [name]  has presented today for surgery, with the diagnosis of upper gi bleed.  The various methods of treatment have been discussed with the patient and family. After consideration of risks, benefits and other options for treatment, the patient has consented to  Procedure(s): ESOPHAGOGASTRODUODENOSCOPY (EGD) WITH PROPOFOL  (N/A) as a surgical intervention.  The patient's history has been reviewed, patient examined, no change in status, stable for surgery.  I have reviewed the patient's chart and labs.  Questions were answered to the patient's satisfaction.     Elspeth P Neelam Tiggs

## 2023-02-06 NOTE — Consult Note (Signed)
 Reason for Consult:epistaxis Referring Physician: Dr Sebastian Clayborne Hasty Lori [name] is an 53 y.o. female.  HPI: hx of emesis and bleeding. She has had nose bleed issues from the right intermittantly for months. She has nose bleed while undergoing GI procedure. She is currently still in GI and not bleeding  Past Medical History:  Diagnosis Date   CAD (coronary artery disease)    Depression    Diabetes mellitus without complication (HCC)    History of CT scan    History of mammogram    History of MRI    Hypertension    Pheochromocytoma    Reversible cerebrovascular vasoconstriction syndrome    Stroke (HCC)    Thyroid  disease     Past Surgical History:  Procedure Laterality Date   ABDOMINAL HYSTERECTOMY     CESAREAN SECTION     3   CORONARY ARTERY BYPASS GRAFT     Right adrenal gland removal for pheochromocytoma Right     Family History  Problem Relation Age of Onset   Hypertension Mother    Diabetes Mother    Atrial fibrillation Mother    Hypertension Father    Heart attack Father    Dementia Father    Diabetes Brother    Brain cancer Brother    Asthma Daughter    Sickle cell anemia Son    Atrial fibrillation Maternal Uncle     Social History:  reports that she has never smoked. She has never used smokeless tobacco. She reports that she does not currently use alcohol. She reports that she does not use drugs.  Allergies:  Allergies  Allergen Reactions   Asa [Aspirin ] Anaphylaxis, Swelling and Other (See Comments)    Throat closes   Contrast Media [Iodinated Contrast Media] Anaphylaxis and Swelling   Imitrex [Sumatriptan] Anaphylaxis   Ms Contin  [Morphine ] Anaphylaxis and Swelling   Penicillins Swelling and Other (See Comments)    Mouth and eyes swell   Firvanq  [Vancomycin ] Other (See Comments)    Red Man's Syndrome   Cipro  [Ciprofloxacin  Hcl] Nausea And Vomiting   Nitrofuran Derivatives Anxiety   Wound Dressing Adhesive Itching and Rash     Medications: I have reviewed the patient's current medications.  Results for orders placed or performed during the hospital encounter of 02/04/23 (from the past 48 hours)  CBG monitoring, ED     Status: Abnormal   Collection Time: 02/04/23  3:55 PM  Result Value Ref Range   Glucose-Capillary 234 (H) 70 - 99 mg/dL    Comment: Glucose reference range applies only to samples taken after fasting for at least 8 hours.  Urinalysis, Routine w reflex microscopic -Urine, Clean Catch     Status: Abnormal   Collection Time: 02/04/23  4:54 PM  Result Value Ref Range   Color, Urine YELLOW YELLOW   APPearance HAZY (A) CLEAR   Specific Gravity, Urine 1.012 1.005 - 1.030   pH 5.0 5.0 - 8.0   Glucose, UA 150 (A) NEGATIVE mg/dL   Hgb urine dipstick NEGATIVE NEGATIVE   Bilirubin Urine NEGATIVE NEGATIVE   Ketones, ur 5 (A) NEGATIVE mg/dL   Protein, ur NEGATIVE NEGATIVE mg/dL   Nitrite NEGATIVE NEGATIVE   Leukocytes,Ua NEGATIVE NEGATIVE    Comment: Performed at [facility], [address] [place], [place] [address]  CBG monitoring, ED     Status: Abnormal   Collection Time: 02/04/23  5:18 PM  Result Value Ref Range   Glucose-Capillary 195 (H) 70 - 99 mg/dL  Comment: Glucose reference range applies only to samples taken after fasting for at least 8 hours.  CBG monitoring, ED     Status: Abnormal   Collection Time: 02/04/23 10:51 PM  Result Value Ref Range   Glucose-Capillary 122 (H) 70 - 99 mg/dL    Comment: Glucose reference range applies only to samples taken after fasting for at least 8 hours.  Resp panel by RT-PCR (RSV, Flu A&B, Covid) Anterior Nasal Swab     Status: Abnormal   Collection Time: 02/04/23 10:55 PM   Specimen: Anterior Nasal Swab  Result Value Ref Range   SARS Coronavirus 2 by RT PCR POSITIVE (A) NEGATIVE   Influenza A by PCR NEGATIVE NEGATIVE   Influenza B by PCR NEGATIVE NEGATIVE    Comment: (NOTE) The Xpert Xpress SARS-CoV-2/FLU/RSV plus assay is intended as an  aid in the diagnosis of influenza from Nasopharyngeal swab specimens and should not be used as a sole basis for treatment. Nasal washings and aspirates are unacceptable for Xpert Xpress SARS-CoV-2/FLU/RSV testing.  Fact Sheet for Patients: bloggercourse.com  Fact Sheet for Healthcare Providers: seriousbroker.it  This test is not yet approved or cleared by the [place]  FDA and has been authorized for detection and/or diagnosis of SARS-CoV-2 by FDA under an Emergency Use Authorization (EUA). This EUA will remain in effect (meaning this test can be used) for the duration of the COVID-19 declaration under Section 564(b)(1) of the Act, 21 U.S.C. section 360bbb-3(b)(1), unless the authorization is terminated or revoked.     Resp Syncytial Virus by PCR NEGATIVE NEGATIVE    Comment: (NOTE) Fact Sheet for Patients: bloggercourse.com  Fact Sheet for Healthcare Providers: seriousbroker.it  This test is not yet approved or cleared by the [place]  FDA and has been authorized for detection and/or diagnosis of SARS-CoV-2 by FDA under an Emergency Use Authorization (EUA). This EUA will remain in effect (meaning this test can be used) for the duration of the COVID-19 declaration under Section 564(b)(1) of the Act, 21 U.S.C. section 360bbb-3(b)(1), unless the authorization is terminated or revoked.  Performed at [facility], [address] [place], [place] [address]   CBC     Status: Abnormal   Collection Time: 02/04/23 11:40 PM  Result Value Ref Range   WBC 7.5 4.0 - 10.5 K/uL   RBC 3.67 (L) 3.87 - 5.11 MIL/uL   Hemoglobin 8.9 (L) 12.0 - 15.0 g/dL    Comment: Reticulocyte Hemoglobin testing may be clinically indicated, consider ordering this additional test [id number]    HCT 27.2 (L) 36.0 - 46.0 %   MCV 74.1 (L) 80.0 - 100.0 fL   MCH 24.3 (L) 26.0 - 34.0 pg   MCHC  32.7 30.0 - 36.0 g/dL   RDW 82.7 (H) 88.4 - 84.4 %   Platelets 315 150 - 400 K/uL   nRBC 0.0 0.0 - 0.2 %    Comment: Performed at [facility], [address], [place], [place] [address]  CBC with Differential/Platelet     Status: Abnormal   Collection Time: 02/05/23  7:44 AM  Result Value Ref Range   WBC 6.5 4.0 - 10.5 K/uL   RBC 3.74 (L) 3.87 - 5.11 MIL/uL   Hemoglobin 9.0 (L) 12.0 - 15.0 g/dL   HCT 71.2 (L) 63.9 - 53.9 %   MCV 76.7 (L) 80.0 - 100.0 fL   MCH 24.1 (L) 26.0 - 34.0 pg   MCHC 31.4 30.0 - 36.0 g/dL   RDW 82.5 (H)  11.5 - 15.5 %   Platelets 278 150 - 400 K/uL   nRBC 0.0 0.0 - 0.2 %   Neutrophils Relative % 53 %   Neutro Abs 3.4 1.7 - 7.7 K/uL   Lymphocytes Relative 33 %   Lymphs Abs 2.2 0.7 - 4.0 K/uL   Monocytes Relative 11 %   Monocytes Absolute 0.7 0.1 - 1.0 K/uL   Eosinophils Relative 2 %   Eosinophils Absolute 0.1 0.0 - 0.5 K/uL   Basophils Relative 1 %   Basophils Absolute 0.0 0.0 - 0.1 K/uL   Immature Granulocytes 0 %   Abs Immature Granulocytes 0.02 0.00 - 0.07 K/uL    Comment: Performed at [facility], [address] [place], [place] [address]  Basic metabolic panel     Status: Abnormal   Collection Time: 02/05/23  7:44 AM  Result Value Ref Range   Sodium 136 135 - 145 mmol/L   Potassium 3.6 3.5 - 5.1 mmol/L   Chloride 108 98 - 111 mmol/L   CO2 19 (L) 22 - 32 mmol/L   Glucose, Bld 133 (H) 70 - 99 mg/dL    Comment: Glucose reference range applies only to samples taken after fasting for at least 8 hours.   BUN 9 6 - 20 mg/dL   Creatinine, Ser 9.27 0.44 - 1.00 mg/dL   Calcium  7.9 (L) 8.9 - 10.3 mg/dL   GFR, Estimated >39 >39 mL/min    Comment: (NOTE) Calculated using the CKD-EPI Creatinine Equation (2021)    Anion gap 9 5 - 15    Comment: Performed at [facility], [address], [place], [place] [address]  C-reactive protein     Status: Abnormal   Collection Time: 02/05/23  7:44 AM  Result Value Ref Range   CRP 10.3 (H) <1.0  mg/dL    Comment: Performed at [facility], [address], [place], [place] [address]  CBG monitoring, ED     Status: Abnormal   Collection Time: 02/05/23  7:46 AM  Result Value Ref Range   Glucose-Capillary 118 (H) 70 - 99 mg/dL    Comment: Glucose reference range applies only to samples taken after fasting for at least 8 hours.  CBG monitoring, ED     Status: Abnormal   Collection Time: 02/05/23 11:51 AM  Result Value Ref Range   Glucose-Capillary 113 (H) 70 - 99 mg/dL    Comment: Glucose reference range applies only to samples taken after fasting for at least 8 hours.  Glucose, capillary     Status: Abnormal   Collection Time: 02/05/23  4:59 PM  Result Value Ref Range   Glucose-Capillary 105 (H) 70 - 99 mg/dL    Comment: Glucose reference range applies only to samples taken after fasting for at least 8 hours.  Glucose, capillary     Status: None   Collection Time: 02/05/23  9:02 PM  Result Value Ref Range   Glucose-Capillary 96 70 - 99 mg/dL    Comment: Glucose reference range applies only to samples taken after fasting for at least 8 hours.  C-reactive protein     Status: Abnormal   Collection Time: 02/06/23  7:57 AM  Result Value Ref Range   CRP 8.1 (H) <1.0 mg/dL    Comment: Performed at [facility], [address], [place], [place] [address]  Magnesium      Status: Abnormal   Collection Time: 02/06/23  7:57 AM  Result Value Ref Range  Magnesium  1.6 (L) 1.7 - 2.4 mg/dL    Comment: Performed at [facility], [address] [place], [place] [address]  Basic metabolic panel     Status: Abnormal   Collection Time: 02/06/23  7:57 AM  Result Value Ref Range   Sodium 140 135 - 145 mmol/L   Potassium 3.8 3.5 - 5.1 mmol/L   Chloride 116 (H) 98 - 111 mmol/L   CO2 15 (L) 22 - 32 mmol/L   Glucose, Bld 86 70 - 99 mg/dL    Comment: Glucose reference range applies only to samples taken after fasting for at least 8 hours.   BUN 7 6 - 20 mg/dL   Creatinine, Ser  9.22 0.44 - 1.00 mg/dL   Calcium  7.8 (L) 8.9 - 10.3 mg/dL   GFR, Estimated >39 >39 mL/min    Comment: (NOTE) Calculated using the CKD-EPI Creatinine Equation (2021)    Anion gap 9 5 - 15    Comment: Performed at [facility], [address], [place], [place] [address]  CBC with Differential/Platelet     Status: Abnormal   Collection Time: 02/06/23  7:57 AM  Result Value Ref Range   WBC 5.4 4.0 - 10.5 K/uL   RBC 3.47 (L) 3.87 - 5.11 MIL/uL   Hemoglobin 8.3 (L) 12.0 - 15.0 g/dL    Comment: Reticulocyte Hemoglobin testing may be clinically indicated, consider ordering this additional test [id number]    HCT 26.9 (L) 36.0 - 46.0 %   MCV 77.5 (L) 80.0 - 100.0 fL   MCH 23.9 (L) 26.0 - 34.0 pg   MCHC 30.9 30.0 - 36.0 g/dL   RDW 82.5 (H) 88.4 - 84.4 %   Platelets 294 150 - 400 K/uL   nRBC 0.0 0.0 - 0.2 %   Neutrophils Relative % 52 %   Neutro Abs 2.8 1.7 - 7.7 K/uL   Lymphocytes Relative 35 %   Lymphs Abs 1.9 0.7 - 4.0 K/uL   Monocytes Relative 9 %   Monocytes Absolute 0.5 0.1 - 1.0 K/uL   Eosinophils Relative 3 %   Eosinophils Absolute 0.2 0.0 - 0.5 K/uL   Basophils Relative 1 %   Basophils Absolute 0.0 0.0 - 0.1 K/uL   Immature Granulocytes 0 %   Abs Immature Granulocytes 0.01 0.00 - 0.07 K/uL    Comment: Performed at [facility], [address], [place], [place] [address]  Glucose, capillary     Status: None   Collection Time: 02/06/23  8:24 AM  Result Value Ref Range   Glucose-Capillary 77 70 - 99 mg/dL    Comment: Glucose reference range applies only to samples taken after fasting for at least 8 hours.  Prepare RBC (crossmatch) INTRAOP ONLY     Status: None   Collection Time: 02/06/23 10:37 AM  Result Value Ref Range   Order Confirmation      ORDER PROCESSED BY BLOOD BANK Performed at [facility], [address] [place], [place] [address]   Glucose, capillary     Status: None   Collection Time: 02/06/23  1:05 PM  Result Value Ref Range    Glucose-Capillary 83 70 - 99 mg/dL    Comment: Glucose reference range applies only to samples taken after fasting for at least 8 hours.    No results found.  ROS Blood pressure 117/83, pulse 90, temperature (!) 97 F (36.1 C), temperature source Temporal, resp. rate 18, height 5' 2 (1.575 m), weight 81.6  kg, SpO2 96%. Physical Exam Constitutional:      Comments: She is awakening from anesthesia. No bleeding  HENT:     Nose:     Comments: No bleeding      Assessment/Plan: Epistaxis- will assess her once out of GI lab and awake. She is currently not bleeding. Call if further bleeding   Norleen Notice 02/06/2023, 1:37 PM

## 2023-02-06 NOTE — Evaluation (Signed)
 Speech Language Pathology Evaluation Patient Details Name: Lori [name] [facility] MRN: [id number] DOB: 1970-07-17 Today's Date: 02/06/2023 Time: 1540-1555 SLP Time Calculation (min) (ACUTE ONLY): 15 min  Problem List:  Patient Active Problem List   Diagnosis Date Noted   Hypomagnesemia 02/06/2023   Metabolic acidosis 02/06/2023   Acute esophagitis 02/06/2023   Adenomatous duodenal polyp 02/06/2023   Gastric polyp 02/06/2023   COVID-19 virus infection 02/05/2023   Hematemesis 02/04/2023   Anticoagulated 02/04/2023   Brief psychotic disorder (HCC) 01/23/2023   Urinary incontinence 01/22/2023   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   Diabetic hyperosmolar non-ketotic state (HCC) 01/19/2023   Cerebrovascular disease 01/03/2023   Hypokalemia 01/02/2023   Thrombocytosis 01/02/2023   Microcytic anemia 01/02/2023   OSA on CPAP 01/02/2023   Acute cystitis 12/26/2022   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 12/25/2022   History of DVT (deep vein thrombosis) 12/25/2022   S/P total hysterectomy 12/24/2022   Delirium 11/22/2022   Dehydration 11/22/2022   Acute encephalopathy 11/04/2022   Antiplatelet or antithrombotic long-term use 11/04/2022   Hypotension 11/04/2022   Acute metabolic encephalopathy 11/04/2022   Morbid obesity (HCC) 10/13/2022   Aspiration pneumonia (HCC) 10/13/2022   Chronic diastolic CHF (congestive heart failure) (HCC) 10/13/2022   Chronic anemia 10/13/2022   Acute pancreatitis 10/11/2022   Chest pain of uncertain etiology 10/06/2022   Coronary artery disease with chronic angina 10/06/2022   Generalized anxiety disorder 10/06/2022   Reactive airway disease 10/06/2022   UTI (urinary tract infection) 07/04/2022   Mild intermittent asthma, uncomplicated 07/04/2022   Lethargic 05/01/2022   Hyponatremia 04/30/2022   Hyperkalemia 04/30/2022   AKI (acute kidney injury) (HCC) 04/30/2022   Wheelchair dependence 04/08/2022   Spasticity as late effect of  cerebrovascular accident (CVA) 04/08/2022   Left hemiplegia (HCC) 04/08/2022   Dyspnea 11/02/2021   GERD (gastroesophageal reflux disease) 11/02/2021   Nausea and vomiting 08/25/2021   Acute bronchitis 08/24/2021   Hypothyroidism 07/10/2021   Functional urinary incontinence 05/17/2021   MDD (major depressive disorder), recurrent episode, severe (HCC) 04/09/2021   Diabetic gastroparesis (HCC) 03/16/2021   Migraines 03/16/2021   History of CVA with residual deficit    Debility 02/26/2021   DKA (diabetic ketoacidosis) (HCC) 02/19/2021   Weakness    Insulin  dependent type 2 diabetes mellitus (HCC)    Aphasia    Spell of abnormal behavior    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 01/19/2021   Tachycardia 01/19/2021   CAD (coronary artery disease) 01/19/2021   Spastic hemiparesis of left 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 (HCC) 01/19/2021   Hyperthyroidism    Essential hypertension    Pheochromocytoma    Depression    Hyperlipidemia    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   Noncompliance 10/19/2016   History of pheochromocytoma 10/25/2015   Carotid stenosis, bilateral 10/24/2015   Hemiplegia, unspecified affecting right dominant side (HCC) 07/18/2015   Geographic tongue 07/07/2015   Past Medical History:  Past Medical History:  Diagnosis Date   CAD (coronary artery disease)    Depression    Diabetes mellitus without complication (HCC)    History of CT scan    History of mammogram    History of MRI    Hypertension    Pheochromocytoma    Reversible cerebrovascular vasoconstriction syndrome    Stroke (HCC)    Thyroid  disease    Past Surgical History:  Past Surgical History:  Procedure Laterality Date   ABDOMINAL HYSTERECTOMY  CESAREAN SECTION     3   CORONARY ARTERY BYPASS GRAFT     Right adrenal gland removal for pheochromocytoma Right    HPI:  Lori [name] is a 53 yo female presenting to ED  12/31 with one day history of nausea and coffee-ground emesis with decreased oral intake. Underwent EGD 1/2 with notable R nare epistaxis. Most recently seen by SLP 12/03/22 with recommendations for a Dys 3 texture diet with thin liquids and increased assistance to order. PMH includes T2DM, gastroparesis, CAD s/p CABG, CHF, OSA on CPAP, history of CVA with residual L sided weakness and currently bedbound, history of DVT on Xarelto , pheochromocytoma s/p R adrenalectomy, hypothyroidism   Assessment / Plan / Recommendation Clinical Impression  Pt reports history of dysarthria and motor speech impairments secondary to prior CVA. She previously scored WFL on MMSE. Pt reports no acute concerns with cognitive linguistic function. Her responses are slow and she frequently gropes for words. Today, she scored WFL on all portions administered from the SL[facility], which is consistent with previous evaluation. She does endorse wanting to establish [facility] SLP to target speech intelligibility. No further SLP f/u is needed acutely but recommend HH upon d/c.    SLP Assessment  SLP Recommendation/Assessment: All further Speech Lanaguage Pathology  needs can be addressed in the next venue of care SLP Visit Diagnosis: Dysarthria and anarthria (R47.1);Cognitive communication deficit (R41.841)    Recommendations for follow up therapy are one component of a multi-disciplinary discharge planning process, led by the attending physician.  Recommendations may be updated based on patient status, additional functional criteria and insurance authorization.    Follow Up Recommendations  Home health SLP    Assistance Recommended at Discharge  Frequent or constant Supervision/Assistance  Functional Status Assessment Patient has had a recent decline in their functional status and demonstrates the ability to make significant improvements in function in a reasonable and predictable amount of time.  Frequency and Duration           SLP  Evaluation Cognition  Overall Cognitive Status: Within Functional Limits for tasks assessed Orientation Level: Oriented X4       Comprehension  Auditory Comprehension Overall Auditory Comprehension: Appears within functional limits for tasks assessed    Expression Expression Primary Mode of Expression: Verbal Verbal Expression Overall Verbal Expression: Appears within functional limits for tasks assessed Written Expression Dominant Hand: Right   Oral / Motor  Oral Motor/Sensory Function Overall Oral Motor/Sensory Function: Within functional limits Motor Speech Overall Motor Speech: Impaired Respiration: Within functional limits Phonation: Normal Resonance: Hyponasality Articulation: Impaired Level of Impairment: Conversation Intelligibility: Intelligibility reduced Conversation: 50-74% accurate Motor Planning: Impaired Level of Impairment: Press Photographer Errors: Groping for words            Damien Blumenthal, M.A., CF-SLP Speech Language Pathology, Acute Rehabilitation Services  Secure Chat preferred [phone]  02/06/2023, 4:09 PM

## 2023-02-06 NOTE — Care Management (Signed)
 Transition of Care [facility]) - Inpatient Brief Assessment   Patient Details  Name: Lori [name] MRN: [id number] Date of Birth: 1970/07/03  Transition of Care [facility]) CM/SW Contact:    Corean JAYSON Canary, RN Phone Number: 02/06/2023, 3:26 PM   Clinical Narrative: High Readmission risk: 61% of pertinent, may need increased services  from [facility]  53 yo with multiple history CVD, heart failure CVA, with hematemesis. She is normally wheelchair bound at home and is active with [facility] for RN. Plan for EGD Will need HH resumption orders prior to DC. She is cared for by her husband and family at home  Transition of Care Asessment: Insurance and Status: Insurance coverage has been reviewed   Home environment has been reviewed: Lives with husband Prior level of function:: Bed bound, wheelchair bound Prior/Current Home Services: Current home services   Readmission risk has been reviewed: Yes (High readmission risk)

## 2023-02-06 NOTE — Progress Notes (Signed)
 PT Cancellation Note  Patient Details Name: Lori [name] MRN: [id number] DOB: [birth year]   Cancelled Treatment:    Reason Eval/Treat Not Completed: Patient at procedure or test/unavailable  Off unit for EGD. Will follow up this afternoon as schedule permits.    Lori [name], PT, DPT [facility]  Rehabilitation Services Physical Therapist Office: [phone] Website: [email or web address]  Lori [name] 02/06/2023, 9:35 AM

## 2023-02-06 NOTE — Progress Notes (Addendum)
 EGD done, erosive esophagitis noted as cause of her bleeding. No active bleeding, no high risk stigmata. Some benign polyps noted in the stomach / duodenum, biopsies taken. NO blood noted anywhere in her GI tract on this exam.   Upon extubation / awakening in the endoscopy unit she developed a nose bleed in the R nare. No instrumentation was done there by me or anesthesia to cause this. She endorses history of nose bleeds from R nare at home. Required packing with gauze per anesthesia to help control this. She was hypotensive when this happened, placed on phenylephrine  with systolics in low 100s. Nose bleed appears controlled / slowing. Plavix  is still in her system, last taken 48 hours ago. Anesthesia giving 1 unit PRBC in light of Hgb in low 8s with her hypotension and recent nose bleed.   Will ask ENT to see for this in light of her anticoagulated status. Reached out to primary service to determine level of care right now. Pressors have just been weaned off and her BP seems stable but needs closer monitoring at least right now, will be transitioned to step down unit for closer monitoring today.   Marcey Naval, MD [facility] Gastroenterology

## 2023-02-06 NOTE — Anesthesia Preprocedure Evaluation (Addendum)
 Anesthesia Evaluation  Patient identified by MRN, date of birth, ID band Patient awake    Reviewed: Allergy & Precautions, NPO status , Patient's Chart, lab work & pertinent test results  Airway Mallampati: II  TM Distance: >3 FB Neck ROM: Full    Dental   Pulmonary asthma , sleep apnea and Continuous Positive Airway Pressure Ventilation  COVID +, congestion/cough   Pulmonary exam normal breath sounds clear to auscultation       Cardiovascular hypertension (107/73 preop), Pt. on medications + CAD and + CABG  Normal cardiovascular exam Rhythm:Regular Rate:Normal  Echo 10/2022  1. Left ventricular ejection fraction, by estimation, is 60 to 65%. The left ventricle has normal function. The left ventricle has no regional wall motion abnormalities. There is mild concentric left ventricular hypertrophy. Left ventricular diastolic parameters were normal.   2. Right ventricular systolic function is normal. The right ventricular size is normal. There is normal pulmonary artery systolic pressure. The estimated right ventricular systolic pressure is 21.0 mmHg.   3. The mitral valve is grossly normal. Trivial mitral valve regurgitation.   4. The aortic valve is tricuspid. There is mild calcification of the aortic valve. Aortic valve regurgitation is not visualized. Aortic valve sclerosis/calcification is present, without any evidence of aortic stenosis. Aortic valve mean gradient measures 4.0 mmHg.   5. The inferior vena cava is normal in size with greater than 50% respiratory variability, suggesting right atrial pressure of 3 mmHg.   6. Cannot exclude a small PFO.     Neuro/Psych  Headaches PSYCHIATRIC DISORDERS Anxiety Depression    CVA, Residual Symptoms    GI/Hepatic Neg liver ROS,GERD  Controlled,,Coffee ground emesis   Endo/Other  diabetes, Well Controlled, Type 2, Insulin  DependentHypothyroidism  BMI 33 A1c 8.5  Renal/GU negative Renal  ROS  negative genitourinary   Musculoskeletal negative musculoskeletal ROS (+)    Abdominal  (+) + obese  Peds  Hematology  (+) Blood dyscrasia, anemia Hb 9, plt 278   Anesthesia Other Findings   Reproductive/Obstetrics negative OB ROS                             Anesthesia Physical Anesthesia Plan  ASA: 4  Anesthesia Plan: General   Post-op Pain Management:    Induction: Intravenous and Rapid sequence  PONV Risk Score and Plan: 3 and Ondansetron , Dexamethasone , Midazolam and Treatment may vary due to age or medical condition  Airway Management Planned: Oral ETT  Additional Equipment: None  Intra-op Plan:   Post-operative Plan: Extubation in OR  Informed Consent: I have reviewed the patients History and Physical, chart, labs and discussed the procedure including the risks, benefits and alternatives for the proposed anesthesia with the patient or authorized representative who has indicated his/her understanding and acceptance.     Dental advisory given  Plan Discussed with: CRNA  Anesthesia Plan Comments:        Anesthesia Quick Evaluation

## 2023-02-06 NOTE — Progress Notes (Signed)
 OT Cancellation Note  Patient Details Name: Lori [name] MRN: [id number] DOB: 1971-01-26   Cancelled Treatment:    Reason Eval/Treat Not Completed: Patient at procedure or test/ unavailable. Pt in endoscopy. Will follow.   Kennth Mliss Helling 02/06/2023, 10:39 AM Mliss HERO, OTR/L Acute Rehabilitation Services Office: [phone]

## 2023-02-06 NOTE — Progress Notes (Signed)
 After receiving blood and albumin , pt's BPs 110-120s MAPs 80s-90s, pt alert and oriented.  No further nosebleed and gauze removed from nose.  Notified Dr Sebastian that pt was back at baseline and asked if patient could return to a med tele bed.  Dr. Sebastian came to procedure room to assess pt and changed transfer order back to med tele.  Pt transported to 6N.  Clotilda Schmitz, RN

## 2023-02-06 NOTE — Care Management (Signed)
 Transition of Care [facility]) - Inpatient Brief Assessment   Patient Details  Name: Lori [name] MRN: [id number] Date of Birth: December 05, 1970  [facility] Care [facility]) CM/SW Contact:    Corean JAYSON Canary, RN Phone Number: 02/06/2023, 3:30 PM   Clinical Narrative: High Readmission risk: 61% of pertinent, may need increased services  from [facility]  53 yo with multiple history CVD, heart failure CVA, with hematemesis. She is normally wheelchair bound at home and is active with [facility] for RN. Plan for EGD Will need HH resumption orders prior to DC. She is cared for by her husband and family at home   [facility] will follow  Transition of Care Asessment: Insurance and Status: Insurance coverage has been reviewed   Home environment has been reviewed: Lives with husband Prior level of function:: Bed bound, wheelchair bound Prior/Current Home Services: Current home services   Readmission risk has been reviewed: Yes (High readmission risk)

## 2023-02-06 NOTE — Anesthesia Postprocedure Evaluation (Signed)
 Anesthesia Post Note  Patient: Lori [name]  Procedure(s) Performed: ESOPHAGOGASTRODUODENOSCOPY (EGD) WITH PROPOFOL  BIOPSY     Patient location during evaluation: Endoscopy Anesthesia Type: General Level of consciousness: sedated and patient cooperative Pain management: pain level controlled Vital Signs Assessment: post-procedure vital signs reviewed and stable Respiratory status: spontaneous breathing Cardiovascular status: stable Anesthetic complications: no   No notable events documented.  Last Vitals:  Vitals:   02/06/23 1140 02/06/23 1150  BP: 116/81 117/82  Pulse: 90 91  Resp: 20   Temp:    SpO2: 95% 95%    Last Pain:  Vitals:   02/06/23 1150  TempSrc:   PainSc: 0-No pain                 Norleen Pope

## 2023-02-06 NOTE — Progress Notes (Signed)
 PROGRESS NOTE    Lori [name]  FMW:[id number] DOB: Apr 11, 1970 DOA: 02/04/2023 PCP: Leonarda [name], NP    Chief Complaint  Patient presents with   Emesis    Vomitting brown emesis since 10 pm, constipated for 3 days.    Brief Narrative:  Patient 53 year old unfortunate female history of diabetes type 2 with gastroparesis, CAD status post CABG, chronic diastolic heart failure, OSA on CPAP, history of CVA residual left-sided weakness currently bedbound, history of DVT on Xarelto , hypothyroidism, multiple recent hospitalizations last one 01/30/2023 for transient acute encephalopathy left facial droop, aphasia with unremarkable workup with discontinuation of amlodipine  to avoid hypotension presented to the ED with coffee-ground emesis.  Patient seen in consultation for GI and patient for upper endoscopy.  Patient also noted to have incidental COVID and started on Paxlovid .   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  Melanotic stools -Patient presented with a 1 day history  of coffee-ground emesis/hematemesis noted to have a bout of hematemesis in the ED per ED physician. -Patient noted to be chronically on Xarelto  and Plavix . -Patient also does state has had a month of black stools per her aide. -Patient denies any prior history of peptic ulcer disease, no history of NSAID use. -Hemoglobin noted at 10.5 with repeat at 10.9 on admission. -Patient IV fluid boluses in the ED and placed on IV fluids due to dehydration and tachycardia. -Patient received Protonix  80 mg IV push x 1 on presentation to the ED, currently on IV PPI twice daily which we will continue for now.   -Continue hold Xarelto  and Plavix  and will defer resumption to GI. -Hemoglobin trending down may be partly dilutional versus concern for upper GI bleed with hemoglobin currently at 8.3 from 10.5 on admission. -Patient seen by GI on presentation and patient for upper endoscopy today and currently NPO.  -Serial CBC. -Follow H&H, -Transfusion threshold hemoglobin < 8.   2.  Dehydration -IV fluids.   3.  Hyperthyroidism -Continue home regimen Tapazole  and metoprolol .   4.  Sinus tachycardia -Likely secondary to history of hyperthyroidism patient likely has not received her beta-blocker on day of admission. -Heart rate improved with resumption of home regimen Lopressor .   -IV fluids, supportive care.  5.  Incidental COVID-19 -Patient noted to have a positive SARS coronavirus COVID-19 PCR on admission. -Patient with complaints of congestion cough x 4 days. -CRP elevated but trending down currently at 8.1 from 10.3.  -Patient currently not hypoxic with sats of 100% on room air. -Continue Paxlovid  due to patient's multiple comorbidities. -Continue scheduled  Combivent, Flonase , Claritin . -Symptomatic treatment.    6.  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 -Continue to hold Plavix  and Xarelto . -Hold statin due to possible interactions with  Xarelto . -PT/OT/SLP   7.  Diabetes mellitus type 2 with gastroparesis -Hemoglobin A1c 8.5 (11/24/2022) -CBG noted at 77 this morning. -Patient currently n.p.o. in anticipation of EGD this morning. -Continue decreased/half home dose of long-acting insulin  of Semglee  10 units nightly, SSI.  -Continue IV Reglan . -Follow.   8.  GERD -PPI.   9.  MDD -Stable.   -Lexapro .    10.  CAD/status post CABG -Stable. -Continue home cardiac regimen.   11.  Hyperlipidemia -Continue to hold statin secondary to interaction with Paxlovid . -Resume statin after completion of Paxlovid .  12.  OSA on CPAP -Due to presentation with nausea and coffee-ground emesis we will hold CPAP for now. -If no further nausea or emesis he may resume CPAP this evening.  13.  Hypomagnesemia -Magnesium  at 1.6. -Magnesium  sulfate 4 g IV x 1.  14.  Metabolic acidosis -Patient with bicarb trending down currently at 15 from 19 from 21. -Patient currently n.p.o. placed on bicarb drip for the next 24 hours and reassess.  15.  Morbid obesity -BMI 32.92 kg/m -Lifestyle modification -Outpatient follow-up with PCP.     DVT prophylaxis: SCDs Code Status: Full Family Communication: Updated patient and husband at bedside. Disposition: Likely back home when clinically improved and cleared by GI.  Status is: Observation The patient will require care spanning > 2 midnights and should be moved to inpatient because: Severity of illness   Consultants:  GI: Dr. Leigh 02/04/2023  Procedures:  CT abdomen pelvis 02/04/2023 Chest x-ray 02/04/2023   Antimicrobials:  Anti-infectives (From admission, onward)    Start     Dose/Rate Route Frequency Ordered Stop   02/05/23 1500  nirmatrelvir /ritonavir  (PAXLOVID ) 3 tablet        3 tablet Oral 2 times daily 02/05/23 0743 02/10/23 0959         Subjective: Patient asleep however easily arousable.  Denies any chest pain or worsening shortness of breath.  Still with  complaints of nasal congestion.  Denies any further coffee-ground emesis or melanotic stools.    Objective: Vitals:   02/06/23 0115 02/06/23 0139 02/06/23 0502 02/06/23 0821  BP: (!) 84/70 100/63 116/78 107/73  Pulse: 83 86 87 86  Resp: 18  19 18   Temp: 98.7 F (37.1 C)  98.6 F (37 C) 98 F (36.7 C)  TempSrc:      SpO2: 95%  97% 100%  Weight:      Height:        Intake/Output Summary (Last 24 hours) at 02/06/2023 0930 Last data filed at 02/06/2023 0400 Gross per 24 hour  Intake 3742.25 ml  Output 450 ml  Net 3292.25 ml   Filed Weights   02/04/23 0734  Weight: 81.6 kg    Examination:  General exam: NAD. Respiratory system: Clear to auscultation.  No wheezes, no crackles, no rhonchi.  Fair air movement.  Speaking in full sentences.  Respiratory effort normal. Cardiovascular system: RRR no murmurs rubs or gallops.  No JVD.  No lower extremity edema.  Gastrointestinal system: Abdomen is soft, nontender, nondistended, positive bowel sounds.  No rebound.  No guarding. Central nervous system: Alert and oriented.  Left upper extremity and contractures.  Some dysarthric speech.  5/5 right upper extremity and right lower extremity strength.  3-4/5 left lower extremity strength.  2-3/5 left  upper extremity strength.  Extremities: Symmetric 5 x 5 power. Skin: No rashes, lesions or ulcers Psychiatry: Judgement and insight appear normal. Mood & affect appropriate.     Data Reviewed: I have personally reviewed following labs and imaging studies  CBC: Recent Labs  Lab 01/31/23 0456 02/04/23 0758 02/04/23 1112 02/04/23 1113 02/04/23 2340 02/05/23 0744 02/06/23 0757  WBC 11.1* 9.2  --   --  7.5 6.5 5.4  NEUTROABS  --  6.6  --   --   --  3.4 2.8  HGB 9.5* 10.5* 10.9* 10.9* 8.9* 9.0* 8.3*  HCT 29.4* 32.1* 32.0* 32.0* 27.2* 28.7* 26.9*  MCV 75.8* 75.2*  --   --  74.1* 76.7* 77.5*  PLT 420* 361  --   --  315 278 294    Basic Metabolic Panel: Recent Labs  Lab 01/30/23 1509  01/31/23 0456 02/04/23 0758 02/04/23 1112 02/04/23 1113 02/05/23 0744 02/06/23 0757  NA 132* 132* 134* 134* 134* 136 140  K 4.0 3.8 3.8 3.5 3.5 3.6 3.8  CL 101 101 100 101  --  108 116*  CO2 19* 22 21*  --   --  19* 15*  GLUCOSE 283* 187* 281* 309*  --  133* 86  BUN 29* 21* 11 11  --  9 7  CREATININE 1.31* 1.05* 0.96 0.80  --  0.72 0.77  CALCIUM  8.7* 8.6* 8.9  --   --  7.9* 7.8*  MG 2.1  --   --   --   --   --  1.6*    GFR: Estimated Creatinine Clearance: 83.3 mL/min (by C-G formula based on SCr of 0.77 mg/dL).  Liver Function Tests: Recent Labs  Lab 01/30/23 1509 02/04/23 0758  AST 13* 17  ALT 15 16  ALKPHOS 72 63  BILITOT 0.4 0.6  PROT 6.8 7.3  ALBUMIN  3.3* 3.2*    CBG: Recent Labs  Lab 02/05/23 0746 02/05/23 1151 02/05/23 1659 02/05/23 2102 02/06/23 0824  GLUCAP 118* 113* 105* 96 77     Recent Results (from the past 240 hours)  Resp panel by RT-PCR (RSV, Flu A&B, Covid) Anterior Nasal Swab     Status: Abnormal   Collection Time: 02/04/23 10:55 PM   Specimen: Anterior Nasal Swab  Result Value Ref Range Status   SARS Coronavirus 2 by RT PCR POSITIVE (A) NEGATIVE Final   Influenza A by PCR NEGATIVE NEGATIVE Final   Influenza B by PCR NEGATIVE NEGATIVE Final    Comment: (NOTE) The Xpert Xpress SARS-CoV-2/FLU/RSV plus assay is intended as an aid in the diagnosis of influenza from Nasopharyngeal swab specimens and should not be used as a sole basis for treatment. Nasal washings and aspirates are unacceptable for Xpert Xpress SARS-CoV-2/FLU/RSV testing.  Fact Sheet for Patients: bloggercourse.com  Fact Sheet for Healthcare Providers: seriousbroker.it  This test is not yet approved or cleared by the [place]  FDA and has been authorized for detection and/or diagnosis of SARS-CoV-2 by FDA under an Emergency Use Authorization (EUA). This EUA will remain in effect (meaning this test can be used) for the  duration of the COVID-19 declaration under Section 564(b)(1) of the Act, 21 U.S.C. section 360bbb-3(b)(1), unless the authorization is terminated or revoked.     Resp Syncytial Virus by PCR NEGATIVE NEGATIVE Final    Comment: (NOTE) Fact Sheet for Patients: bloggercourse.com  Fact Sheet for Healthcare Providers: seriousbroker.it  This test is not yet approved or cleared by the [place]  FDA and has been authorized  for detection and/or diagnosis of SARS-CoV-2 by FDA under an Emergency Use Authorization (EUA). This EUA will remain in effect (meaning this test can be used) for the duration of the COVID-19 declaration under Section 564(b)(1) of the Act, 21 U.S.C. section 360bbb-3(b)(1), unless the authorization is terminated or revoked.  Performed at [facility], [address] [place], [place] [address]          Radiology Studies: CT ABDOMEN PELVIS WO CONTRAST Result Date: 02/04/2023 CLINICAL DATA:  Abdominal pain, acute (Ped 0-17y) pancreatitis?/complications//SBO?. EXAM: CT ABDOMEN AND PELVIS WITHOUT CONTRAST TECHNIQUE: Multidetector CT imaging of the abdomen and pelvis was performed following the standard protocol without IV contrast. RADIATION DOSE REDUCTION: This exam was performed according to the departmental dose-optimization program which includes automated exposure control, adjustment of the mA and/or kV according to patient size and/or use of iterative reconstruction technique. COMPARISON:  CT scan abdomen and pelvis from 11/22/2022. FINDINGS: Lower chest: There is small right pleural effusion which is new since the prior study. No left pleural effusion. The visualized lungs are otherwise clear. The heart is normal in size. No pericardial effusion. There are coronary artery atherosclerotic calcifications, in keeping with coronary artery disease. Hepatobiliary: The liver is normal in size. Non-cirrhotic  configuration. No suspicious mass. No intrahepatic or extrahepatic bile duct dilation. Gallbladder is surgically absent. Pancreas: Mild atrophy of the pancreas. There are several dystrophic calcifications throughout the pancreas, nonspecific but commonly seen as a sequela of chronic pancreatitis. No suspicious pancreatic mass seen within the limitations of this unenhanced exam. No peripancreatic fat stranding. Spleen: Within normal limits. No focal lesion. Adrenals/Urinary Tract: Unremarkable left adrenal gland. Right adrenal gland is not well visualized and may have been surgically removed. Correlate with surgical history. No suspicious renal mass within the limitations of this unenhanced exam. No nephroureterolithiasis or obstructive uropathy. Unremarkable urinary bladder. Stomach/Bowel: Redemonstration of a diverticulum arising from the third part of duodenum. No disproportionate dilation of the small or large bowel loops. No evidence of abnormal bowel wall thickening or inflammatory changes. The appendix was not visualized; however there is no acute inflammatory process in the right lower quadrant. There are multiple diverticula throughout the colon, without imaging signs of diverticulitis. Vascular/Lymphatic: No ascites or pneumoperitoneum. No abdominal or pelvic lymphadenopathy, by size criteria. No aneurysmal dilation of the major abdominal arteries. There are marked peripheral atherosclerotic vascular calcifications of the aorta and its major branches. Reproductive: The uterus is surgically absent. No large adnexal mass. Other: There is periumbilical surgical scar in small fat containing hernia. There is also irregular hyperattenuating soft tissue along the lower midline anterior abdominal wall, similar to the prior study, likely from prior surgery related fibrosis/scarring. No abnormal collection seen. Musculoskeletal: No suspicious osseous lesions. There are mild multilevel degenerative changes in the  visualized spine. IMPRESSION: 1. No acute inflammatory process identified within the abdomen or pelvis. No imaging evidence of acute pancreatitis. No bowel obstruction. 2. New small right pleural effusion. 3. Multiple other nonacute observations, as described above. Aortic Atherosclerosis (ICD10-I70.0). Electronically Signed   By: Ree Molt M.D.   On: 02/04/2023 14:46        Scheduled Meds:  baclofen   10 mg Oral TID   escitalopram   20 mg Oral q morning   ezetimibe   10 mg Oral Daily   fluticasone   2 spray Each Nare Daily   folic acid   1 mg Oral Daily   gabapentin   100 mg Oral TID   insulin  aspart  0-15 Units  Subcutaneous TID WC   insulin  glargine-yfgn  10 Units Subcutaneous QHS   ipratropium-albuterol   3 mL Inhalation Q6H   loratadine   10 mg Oral Daily   LORazepam   0.5 mg Oral QHS   methimazole   5 mg Oral q morning   metoCLOPramide  (REGLAN ) injection  5 mg Intravenous Q8H   metoprolol  tartrate  100 mg Oral BID   multivitamin with minerals  1 tablet Oral Daily   nirmatrelvir /ritonavir   3 tablet Oral BID   pantoprazole  (PROTONIX ) IV  40 mg Intravenous Q12H   tamsulosin   0.4 mg Oral QHS   topiramate   25 mg Oral BID   Continuous Infusions:  sodium chloride  125 mL/hr at 02/06/23 0902   magnesium  sulfate bolus IVPB       LOS: 1 day    Time spent: 40 minutes    Toribio Hummer, MD [facility]   To contact the attending provider between 7A-7P or the covering provider during after hours 7P-7A, please log into the web site [email or web address] and access using universal Hall password for that web site. If you do not have the password, please call the hospital operator.  02/06/2023, 9:30 AM

## 2023-02-06 NOTE — Transfer of Care (Signed)
 Immediate Anesthesia Transfer of Care Note  Patient: Lori [name]  Procedure(s) Performed: ESOPHAGOGASTRODUODENOSCOPY (EGD) WITH PROPOFOL  BIOPSY  Patient Location: Endoscopy Unit  Anesthesia Type:General  Level of Consciousness: drowsy and patient cooperative  Airway & Oxygen Therapy: Patient Spontanous Breathing and Patient connected to face mask oxygen  Post-op Assessment: Report given to RN and Post -op Vital signs reviewed and stable  Post vital signs: Reviewed and stable  Last Vitals:  Vitals Value Taken Time  BP 106/75   Temp    Pulse 93   Resp 14   SpO2 100     Last Pain:  Vitals:   02/06/23 0936  TempSrc: Oral  PainSc: 0-No pain         Complications: No notable events documented.

## 2023-02-07 ENCOUNTER — Encounter (HOSPITAL_COMMUNITY): Payer: Self-pay | Admitting: Gastroenterology

## 2023-02-07 ENCOUNTER — Inpatient Hospital Stay (HOSPITAL_COMMUNITY): Payer: 59

## 2023-02-07 DIAGNOSIS — K920 Hematemesis: Secondary | ICD-10-CM | POA: Diagnosis not present

## 2023-02-07 DIAGNOSIS — K922 Gastrointestinal hemorrhage, unspecified: Secondary | ICD-10-CM

## 2023-02-07 DIAGNOSIS — R1084 Generalized abdominal pain: Secondary | ICD-10-CM

## 2023-02-07 LAB — GLUCOSE, CAPILLARY
Glucose-Capillary: 97 mg/dL (ref 70–99)
Glucose-Capillary: 123 mg/dL — ABNORMAL HIGH (ref 70–99)
Glucose-Capillary: 123 mg/dL — ABNORMAL HIGH (ref 70–99)
Glucose-Capillary: 84 mg/dL (ref 70–99)

## 2023-02-07 LAB — SURGICAL PATHOLOGY

## 2023-02-07 NOTE — Progress Notes (Signed)
 Pt transported off unit by transportation staff to get scan

## 2023-02-07 NOTE — [place] FL2 (Signed)
 [place]  MEDICAID FL2 LEVEL OF CARE FORM     IDENTIFICATION  Patient Name: Lori [name] Birthdate: 05-12-1970 Sex: female Admission Date (Current Location): 02/04/2023  [facility] and [place] Number:  Producer, Television/film/video and Address:  [facility], [address] [place], [place] [address]      Provider Number: [id number]  Attending Physician Name and Address:  Cindy Garnette POUR, MD  Relative Name and Phone Number:       Current Level of Care: Hospital Recommended Level of Care: Skilled Nursing Facility Prior Approval Number:    Date Approved/Denied:   PASRR Number: [id number]  Discharge Plan: SNF    Current Diagnoses: Patient Active Problem List   Diagnosis Date Noted   Hypomagnesemia 02/06/2023   Metabolic acidosis 02/06/2023   Acute esophagitis 02/06/2023   Adenomatous duodenal polyp 02/06/2023   Gastric polyp 02/06/2023   COVID-19 virus infection 02/05/2023   Hematemesis 02/04/2023   Anticoagulated 02/04/2023   Brief psychotic disorder (HCC) 01/23/2023   Urinary incontinence 01/22/2023   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   Diabetic hyperosmolar non-ketotic state (HCC) 01/19/2023   Cerebrovascular disease 01/03/2023   Hypokalemia 01/02/2023   Thrombocytosis 01/02/2023   Microcytic anemia 01/02/2023   OSA on CPAP 01/02/2023   Acute cystitis 12/26/2022   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 12/25/2022   History of DVT (deep vein thrombosis) 12/25/2022   S/P total hysterectomy 12/24/2022   Delirium 11/22/2022   Dehydration 11/22/2022   Acute encephalopathy 11/04/2022   Antiplatelet or antithrombotic long-term use 11/04/2022   Hypotension 11/04/2022   Acute metabolic encephalopathy 11/04/2022   Morbid obesity (HCC) 10/13/2022   Aspiration pneumonia (HCC) 10/13/2022   Chronic diastolic CHF (congestive heart failure) (HCC) 10/13/2022   Chronic anemia 10/13/2022   Acute pancreatitis 10/11/2022   Chest pain of  uncertain etiology 10/06/2022   Coronary artery disease with chronic angina 10/06/2022   Generalized anxiety disorder 10/06/2022   Reactive airway disease 10/06/2022   UTI (urinary tract infection) 07/04/2022   Mild intermittent asthma, uncomplicated 07/04/2022   Lethargic 05/01/2022   Hyponatremia 04/30/2022   Hyperkalemia 04/30/2022   AKI (acute kidney injury) (HCC) 04/30/2022   Wheelchair dependence 04/08/2022   Spasticity as late effect of cerebrovascular accident (CVA) 04/08/2022   Left hemiplegia (HCC) 04/08/2022   Dyspnea 11/02/2021   GERD (gastroesophageal reflux disease) 11/02/2021   Nausea and vomiting 08/25/2021   Acute bronchitis 08/24/2021   Hypothyroidism 07/10/2021   Functional urinary incontinence 05/17/2021   MDD (major depressive disorder), recurrent episode, severe (HCC) 04/09/2021   Diabetic gastroparesis (HCC) 03/16/2021   Migraines 03/16/2021   History of CVA with residual deficit    Debility 02/26/2021   DKA (diabetic ketoacidosis) (HCC) 02/19/2021   Weakness    Insulin  dependent type 2 diabetes mellitus (HCC)    Aphasia    Spell of abnormal behavior    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 01/19/2021   Tachycardia 01/19/2021   CAD (coronary artery disease) 01/19/2021   Spastic hemiparesis of left 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 (HCC) 01/19/2021   Hyperthyroidism    Essential hypertension    Pheochromocytoma    Depression    Hyperlipidemia    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   Noncompliance 10/19/2016   History of pheochromocytoma 10/25/2015   Carotid stenosis, bilateral 10/24/2015   Hemiplegia, unspecified affecting right dominant side (HCC) 07/18/2015   Geographic tongue 07/07/2015    Orientation RESPIRATION BLADDER  Height & Weight     Self, Time, Situation, Place  Normal Incontinent Weight: 179 lb 7.3 oz (81.4 kg) Height:  5' 2 (157.5 cm)  BEHAVIORAL SYMPTOMS/MOOD  NEUROLOGICAL BOWEL NUTRITION STATUS      Incontinent Diet (See DC summary)  AMBULATORY STATUS COMMUNICATION OF NEEDS Skin   Extensive Assist Verbally Skin abrasions (MASD Bilateral Interior Thighs)                       Personal Care Assistance Level of Assistance  Bathing, Feeding, Dressing Bathing Assistance: Maximum assistance Feeding assistance: Limited assistance Dressing Assistance: Maximum assistance     Functional Limitations Info  Sight, Hearing, Speech Sight Info: Impaired Hearing Info: Adequate Speech Info: Impaired    SPECIAL CARE FACTORS FREQUENCY                       Contractures Contractures Info: Not present    Additional Factors Info  Insulin  Sliding Scale, Psychotropic, Allergies, Code Status Code Status Info: Full Allergies Info: Asa (Aspirin )  Contrast Media (Iodinated Contrast Media)  Imitrex (Sumatriptan)  Ms Contin  (Morphine )  Penicillins  Firvanq  (Vancomycin )  Cipro  (Ciprofloxacin  Hcl)  Nitrofuran Derivatives  Wound Dressing Adhesive Psychotropic Info: Escitalopram , Lorazepam  Insulin  Sliding Scale Info: See DC summary       Current Medications (02/07/2023):  This is the current hospital active medication list Current Facility-Administered Medications  Medication Dose Route Frequency Provider Last Rate Last Admin   acetaminophen  (TYLENOL ) tablet 650 mg  650 mg Oral Q6H PRN Sebastian Toribio GAILS, MD       Or   acetaminophen  (TYLENOL ) suppository 650 mg  650 mg Rectal Q6H PRN Sebastian Toribio GAILS, MD       albuterol  (PROVENTIL ) (2.5 MG/3ML) 0.083% nebulizer solution 2.5 mg  2.5 mg Nebulization Q6H PRN Sebastian Toribio GAILS, MD       baclofen  (LIORESAL ) tablet 10 mg  10 mg Oral TID Sebastian Toribio GAILS, MD   10 mg at 02/07/23 1518   cyclobenzaprine  (FLEXERIL ) tablet 5 mg  5 mg Oral TID PRN Sebastian Toribio GAILS, MD       escitalopram  (LEXAPRO ) tablet 20 mg  20 mg Oral q morning Sebastian Toribio GAILS, MD   20 mg at 02/07/23 9162   ezetimibe  (ZETIA ) tablet 10  mg  10 mg Oral Daily Sebastian Toribio GAILS, MD   10 mg at 02/07/23 9163   fluticasone  (FLONASE ) 50 MCG/ACT nasal spray 2 spray  2 spray Each Nare Daily Sebastian Toribio GAILS, MD   2 spray at 02/07/23 1117   folic acid  (FOLVITE ) tablet 1 mg  1 mg Oral Daily Sebastian Toribio GAILS, MD   1 mg at 02/07/23 9162   gabapentin  (NEURONTIN ) capsule 100 mg  100 mg Oral TID Sebastian Toribio GAILS, MD   100 mg at 02/07/23 1518   hydrALAZINE  (APRESOLINE ) injection 5 mg  5 mg Intravenous Q6H PRN Sebastian Toribio GAILS, MD       insulin  aspart (novoLOG ) injection 0-15 Units  0-15 Units Subcutaneous TID WC Sebastian Toribio GAILS, MD   2 Units at 02/07/23 1212   insulin  glargine-yfgn (SEMGLEE ) injection 10 Units  10 Units Subcutaneous QHS Sebastian Toribio GAILS, MD   10 Units at 02/06/23 2209   loratadine  (CLARITIN ) tablet 10 mg  10 mg Oral Daily Sebastian Toribio GAILS, MD   10 mg at 02/07/23 9162   LORazepam  (ATIVAN ) tablet 0.5 mg  0.5 mg Oral QHS Sebastian Toribio GAILS, MD  0.5 mg at 02/06/23 2209   magnesium  sulfate IVPB 4 g 100 mL  4 g Intravenous Once Sebastian Toribio GAILS, MD       methimazole  (TAPAZOLE ) tablet 5 mg  5 mg Oral q morning Sebastian Toribio GAILS, MD   5 mg at 02/07/23 9163   metoCLOPramide  (REGLAN ) injection 5 mg  5 mg Intravenous Q8H Sebastian Toribio GAILS, MD   5 mg at 02/07/23 1518   metoprolol  tartrate (LOPRESSOR ) tablet 100 mg  100 mg Oral BID Sebastian Toribio GAILS, MD   100 mg at 02/07/23 9163   multivitamin with minerals tablet 1 tablet  1 tablet Oral Daily Sebastian Toribio GAILS, MD   1 tablet at 02/07/23 9163   nirmatrelvir /ritonavir  (PAXLOVID ) 3 tablet  3 tablet Oral BID Sebastian Toribio GAILS, MD   3 tablet at 02/07/23 1117   ondansetron  (ZOFRAN ) tablet 4 mg  4 mg Oral Q6H PRN Sebastian Toribio GAILS, MD       Or   ondansetron  (ZOFRAN ) injection 4 mg  4 mg Intravenous Q6H PRN Sebastian Toribio GAILS, MD       oxyCODONE  (Oxy IR/ROXICODONE ) immediate release tablet 5 mg  5 mg Oral Q4H PRN Sebastian Toribio GAILS, MD   5 mg at 02/07/23 1518   pantoprazole   (PROTONIX ) injection 40 mg  40 mg Intravenous Q12H Sebastian Toribio GAILS, MD   40 mg at 02/07/23 0836   polyethylene glycol (MIRALAX  / GLYCOLAX ) packet 17 g  17 g Oral Daily Sebastian Toribio GAILS, MD   17 g at 02/07/23 0835   sodium bicarbonate  150 mEq in dextrose  5 % 1,150 mL infusion   Intravenous Continuous Sebastian Toribio GAILS, MD 100 mL/hr at 02/06/23 1534 New Bag at 02/06/23 1534   sodium chloride  flush (NS) 0.9 % injection 3 mL  3 mL Intravenous Q12H Sebastian Toribio GAILS, MD   3 mL at 02/07/23 0837   sorbitol  70 % solution 30 mL  30 mL Oral Daily PRN Sebastian Toribio GAILS, MD       tamsulosin  (FLOMAX ) capsule 0.4 mg  0.4 mg Oral QHS Sebastian Toribio GAILS, MD   0.4 mg at 02/06/23 2208   topiramate  (TOPAMAX ) tablet 25 mg  25 mg Oral BID Sebastian Toribio GAILS, MD   25 mg at 02/07/23 9162     Discharge Medications: Please see discharge summary for a list of discharge medications.  Relevant Imaging Results:  Relevant Lab Results:   Additional Information SS#: [id number]  Harlene Sharps, LCSW

## 2023-02-07 NOTE — Progress Notes (Signed)
   02/07/23 0700  Vitals  Temp 98.2 F (36.8 C)  Temp Source Oral  BP 107/87  MAP (mmHg) 96  BP Location Left Arm  BP Method Automatic  Patient Position (if appropriate) Lying  Pulse Rate 83  Pulse Rate Source Dinamap  Resp 17  Level of Consciousness  Level of Consciousness Alert  Oxygen Therapy  SpO2 97 %  O2 Device Room Air

## 2023-02-07 NOTE — Progress Notes (Signed)
 Breakfast tray ordered for pt. Pt set on automatic trays

## 2023-02-07 NOTE — Evaluation (Signed)
 Physical Therapy Evaluation Patient Details Name: Lori [name] MRN: [id number] DOB: 06-05-1970 Today's Date: 02/07/2023  History of Present Illness  53 y.o. female presents to [facility] 02/04/23 with n/v with coffee ground emesis. S/p EGD on 1/2 w/ erosive esophagitis noted as cause of bleeding with 1 cm hiatal hernia. Awaiting pathology for biopsied polyp. Pt given 1 unit PRBC due to hypotension, low Hgb, and R nose bleed after EGD. Pt also with + Covid diagnosis. Multiple prior admits for acute encephalopathy and gastroparesis. PMHx:  DM, obesity, CVA with spastic hemiplegia and L sided weakness, hyperthyroidism, HTN, CAD, anxiety and depression, HLD, GERD, aphasia, AKI, polypharmacy, CAD s/p CABG, DVT on xarelto    Clinical Impression  Pt in bed upon arrival and agreeable to PT eval. Prior to admit, pt required physical assist for mobility and ADLs. Pt was working on ambulating short distances with L platform RW and physical assist. In today's session, pt required MaxA for bed mobility and ModAx2 to stand with 2UE hold. Pt was able to take a few steps towards the recliner with ModAx2 and 2 UE hold. Pt presents to therapy session below prior level of mobility with decreased balance, L sided weakness, and mobility. Pt does not currently have 24/7 physical assist available at home. Pt would benefit from acute skilled PT to address functional impairments. Recommending post-acute rehab <3hrs to return to previous level of independence. Acute PT to follow.          If plan is discharge home, recommend the following: A lot of help with walking and/or transfers;A lot of help with bathing/dressing/bathroom;Assistance with cooking/housework;Direct supervision/assist for medications management;Direct supervision/assist for financial management;Assist for transportation;Help with stairs or ramp for entrance   Can travel by private vehicle   No    Equipment Recommendations Other (comment)  (TBD at next venue)     Functional Status Assessment Patient has had a recent decline in their functional status and demonstrates the ability to make significant improvements in function in a reasonable and predictable amount of time.     Precautions / Restrictions Precautions Precautions: Fall Precaution Comments: L sided weakness from prior CVA, Covid+ Restrictions Weight Bearing Restrictions Per Provider Order: No      Mobility  Bed Mobility Overal bed mobility: Needs Assistance Bed Mobility: Supine to Sit    Supine to sit: Max assist, HOB elevated    General bed mobility comments: Pt able to move R LE towards EOB, required assist for trunk elevation and to move B LE's off EOB. Pt able to push to sit with R UE. Needed MaxA with use of bed pad to shift hips forward    Transfers Overall transfer level: Needs assistance Equipment used: 2 person hand held assist Transfers: Sit to/from Stand Sit to Stand: Mod assist, +2 physical assistance, Min assist      General transfer comment: x2 stands with ModAx2 and x1 stand with MinAx2. Assist for boost up and steadying with increased time. Pt able to hold onto posterior elbow with R UE, L UE support by therapist.    Ambulation/Gait Ambulation/Gait assistance: Mod assist, +2 physical assistance Gait Distance (Feet): 4 Feet Assistive device: 2 person hand held assist Gait Pattern/deviations: Step-to pattern, Decreased step length - left, Decreased dorsiflexion - left, Decreased stance time - left, Shuffle Gait velocity: dec     General Gait Details: Pt with limited L ankle DF and shuffles L LE forwards. Able to take steps with ModAx2 and B UE support  Balance Overall balance assessment: Needs assistance, Mild deficits observed, not formally tested Sitting-balance support: Single extremity supported, Feet supported Sitting balance-Leahy Scale: Fair Sitting balance - Comments: Initially needs MinA for posterior and right lateral  lean. Occasionally needs CGA, however, will fatigue and need support Postural control: Posterior lean, Right lateral lean Standing balance support: Bilateral upper extremity supported, During functional activity, Reliant on assistive device for balance Standing balance-Leahy Scale: Poor Standing balance comment: reliant on UE support and external support         Pertinent Vitals/Pain Pain Assessment Pain Assessment: No/denies pain    Home Living Family/patient expects to be discharged to:: Private residence Living Arrangements: Spouse/significant other Available Help at Discharge: Family;Available PRN/intermittently;Personal care attendant Type of Home: Apartment Home Access: Level entry       Home Layout: One level Home Equipment: Hospital bed;Wheelchair - manual;Tub bench Additional Comments: Aide daily for 3 hours/day. pt's husband works during the day.    Prior Function Prior Level of Function : Needs assist       Physical Assist : Mobility (physical);ADLs (physical) Mobility (physical): Bed mobility;Transfers;Gait ADLs (physical): Grooming;Bathing;Dressing;Toileting;IADLs Mobility Comments: getting HHPT, reports walking with a L platfrom walker household distances with support from PT; reports staying in bed if alone. always has assist for transfers ADLs Comments: has home aide 3-4 hours/day 7 days per week, largely bed bound with transfers to [facility] with assist, has home purewick system.  Patient reports able to complete mouth care and eating without assist but aide assists with all other ADLs.     Extremity/Trunk Assessment   Upper Extremity Assessment Upper Extremity Assessment: Defer to OT evaluation    Lower Extremity Assessment Lower Extremity Assessment: LLE deficits/detail;RLE deficits/detail RLE Deficits / Details: generalized weakness, alert to light touch LLE Deficits / Details: limited hip flexion against gravity, WFL knee ROM, at least 3/5, limited ankle  DF/PF ROM. Alert to light touch    Cervical / Trunk Assessment Cervical / Trunk Assessment: Normal  Communication   Communication Communication: Difficulty communicating thoughts/reduced clarity of speech Cueing Techniques: Verbal cues  Cognition Arousal: Alert Behavior During Therapy: WFL for tasks assessed/performed Overall Cognitive Status: No family/caregiver present to determine baseline cognitive functioning     General Comments General comments (skin integrity, edema, etc.): VSS on RA, in recliner with L UE propped and feet propped on pillow     PT Assessment Patient needs continued PT services  PT Problem List Decreased strength;Decreased range of motion;Decreased activity tolerance;Decreased balance;Decreased mobility;Decreased safety awareness;Obesity       PT Treatment Interventions DME instruction;Gait training;Functional mobility training;Therapeutic activities;Therapeutic exercise;Balance training;Neuromuscular re-education;Patient/family education;Wheelchair mobility training    PT Goals (Current goals can be found in the Care Plan section)  Acute Rehab PT Goals Patient Stated Goal: to get better PT Goal Formulation: With patient Time For Goal Achievement: 02/21/23 Potential to Achieve Goals: Fair    Frequency Min 1X/week     Co-evaluation   Reason for Co-Treatment: Complexity of the patient's impairments (multi-system involvement);For patient/therapist safety;To address functional/ADL transfers PT goals addressed during session: Mobility/safety with mobility;Balance         AM-PAC PT 6 Clicks Mobility  Outcome Measure Help needed turning from your back to your side while in a flat bed without using bedrails?: A Lot Help needed moving from lying on your back to sitting on the side of a flat bed without using bedrails?: A Lot Help needed moving to and from a bed to a chair (including a wheelchair)?: Total  Help needed standing up from a chair using your  arms (e.g., wheelchair or bedside chair)?: Total Help needed to walk in hospital room?: Total Help needed climbing 3-5 steps with a railing? : Total 6 Click Score: 8    End of Session Equipment Utilized During Treatment: Gait belt Activity Tolerance: Patient tolerated treatment well Patient left: in chair;with call bell/phone within reach Nurse Communication: Mobility status PT Visit Diagnosis: Unsteadiness on feet (R26.81);Muscle weakness (generalized) (M62.81)    Time: 9142-9068 PT Time Calculation (min) (ACUTE ONLY): 34 min   Charges:   PT Evaluation $PT Eval Moderate Complexity: 1 Mod   PT General Charges $$ ACUTE PT VISIT: 1 Visit        Kate ORN, PT, DPT Secure Chat Preferred  Rehab Office [phone]  Kate BRAVO Wendolyn 02/07/2023, 11:48 AM

## 2023-02-07 NOTE — Progress Notes (Signed)
 Progress Note   Patient: Lori [name] FMW:[id number] DOB: 10/04/1970 DOA: 02/04/2023     2 DOS: the patient was seen and examined on 02/07/2023   Brief hospital course: 54 year old unfortunate female history of diabetes type 2 with gastroparesis, CAD status post CABG, chronic diastolic heart failure, OSA on CPAP, history of CVA residual left-sided weakness currently bedbound, history of DVT on Xarelto , hypothyroidism, multiple recent hospitalizations last one 01/30/2023 for transient acute encephalopathy left facial droop, aphasia with unremarkable workup with discontinuation of amlodipine  to avoid hypotension presented to the ED with coffee-ground emesis.  Patient seen in consultation for GI and patient for upper endoscopy.  Patient also noted to have incidental COVID and started on Paxlovid .    Assessment and Plan: #1 hematemesis/coffee-ground emesis/?  Melanotic stools -Patient presented with a 1 day history of coffee-ground emesis/hematemesis noted to have a bout of hematemesis in the ED per ED physician. -Patient noted to be chronically on Xarelto  and Plavix . -Patient also does state has had a month of black stools per her aide. -Patient denies any prior history of peptic ulcer disease, no history of NSAID use. -Hemoglobin noted at 10.5 with repeat at 10.9 on admission. -Patient IV fluid boluses in the ED and placed on IV fluids due to dehydration and tachycardia. -Patient received Protonix  80 mg IV push x 1 on presentation to the ED, currently on IV PPI twice daily which we will continue for now.   -Continue hold Xarelto  and Plavix  at this time, resume when OK with GI -Hemoglobin trending down may be partly dilutional versus concern for upper GI bleed with hemoglobin currently at 8.3 from 10.5 on admission. -pt now s/p EGD with findings of erosive esophagitis, likely source of bleed -recheck cbc in AM   2.  Dehydration -IV fluids.   3.  Hyperthyroidism -Continue  home regimen Tapazole  and metoprolol .   4.  Sinus tachycardia -Likely secondary to history of hyperthyroidism patient likely has not received her beta-blocker on day of admission. -Heart rate improved with resumption of home regimen Lopressor .   -IV fluids, supportive care.   5.  Incidental COVID-19 -Patient noted to have a positive SARS coronavirus COVID-19 PCR on admission. -Patient with complaints of congestion cough x 4 days. -CRP elevated but trending down currently at 8.1 from 10.3.  -Patient currently not hypoxic with sats of 100% on room air. -Continue Paxlovid  due to patient's multiple comorbidities. -Continue scheduled Combivent, Flonase , Claritin . -Cont supportive care     6.  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 -Continue to hold Plavix  and Xarelto . -Hold statin due to possible interactions with Xarelto . -PT/OT/SLP   7.  Diabetes mellitus type 2 with gastroparesis -Hemoglobin A1c 8.5 (11/24/2022) -CBG noted at 77 this morning. -Patient currently n.p.o. in anticipation of EGD this morning. -Continue decreased/half home dose of long-acting insulin  of Semglee  10 units nightly, SSI.  -Continue IV Reglan . -Follow.   8.  GERD -PPI.   9.  MDD -Stable.   -Lexapro .    10.  CAD/status post CABG -Stable. -Continue home cardiac regimen.   11.  Hyperlipidemia -Continue to hold statin secondary to interaction with Paxlovid . -Resume statin after completion of Paxlovid .   12.  OSA on CPAP -Due to presentation with nausea and coffee-ground emesis we will hold CPAP for now. -If no further nausea or emesis he may resume CPAP this evening.   13.  Hypomagnesemia -Magnesium  at 1.6. -Magnesium  sulfate 4 g IV x 1.  14.  Metabolic acidosis -Patient with bicarb trending down currently at 15 from 19 from 21. -Pt was given course of bicarb. Recheck bmet in AM   15.  Morbid obesity -BMI 32.92 kg/m -Lifestyle  modification -Outpatient follow-up with PCP.  16. Nose bleed -Seen by ENT -No masses seen on MRI as well as CT maxillofacial as was originally recommended by ENT, reviewed -ENT signed off. Bleeding has stopped   Subjective: Unable to assess given deficits  Physical Exam: Vitals:   02/07/23 0429 02/07/23 0700 02/07/23 0836 02/07/23 1705  BP:  107/87 119/86 98/71  Pulse:  83 86 73  Resp:  17  17  Temp:  98.2 F (36.8 C)  97.8 F (36.6 C)  TempSrc:  Oral  Oral  SpO2:  97%  97%  Weight: 81.4 kg     Height:       General exam: Awake, laying in bed, in nad Respiratory system: Normal respiratory effort, no wheezing Cardiovascular system: regular rate, s1, s2 Gastrointestinal system: Soft, nondistended, positive BS Central nervous system: CN2-12 grossly intact, strength intact Extremities: Perfused, no clubbing Skin: Normal skin turgor, no notable skin lesions seen Psychiatry: difficult to assess given deficits  Data Reviewed:  There are no new results to review at this time.  Family Communication: Pt in room, family not at bedside  Disposition: Status is: Inpatient Remains inpatient appropriate because: severity of illness  Planned Discharge Destination: Skilled nursing facility    Author: Garnette Pelt, MD 02/07/2023 5:09 PM  For on call review [email or web address].

## 2023-02-07 NOTE — Progress Notes (Signed)
   02/07/23 1705  Vitals  Temp 97.8 F (36.6 C)  Temp Source Oral  BP 98/71  MAP (mmHg) 81  BP Location Left Arm  BP Method Automatic  Patient Position (if appropriate) Lying  Pulse Rate 73  Pulse Rate Source Dinamap  Resp 17  Level of Consciousness  Level of Consciousness Alert  MEWS COLOR  MEWS Score Color Green  Oxygen Therapy  SpO2 97 %  O2 Device Room Air  MEWS Score  MEWS Temp 0  MEWS Systolic 1  MEWS Pulse 0  MEWS RR 0  MEWS LOC 0  MEWS Score 1   Chiu,MD made aware

## 2023-02-07 NOTE — TOC CM/SW Note (Addendum)
 PT/OT recommend SNF. NCM went to room, spoke with patient. Husband not in room. Patient asked NCM to call husband Lori [name] [phone] . NCM called same, no answer and voicemail box full. NCM sent a HIPAA compliant text to number .   Will leave hand off for weekend TOC to follow up   Lori [name] [phone] called NCM back. Discussed recommendation for SNF. He does prefer [facility] to go to SNF for short term rehab prior to going home. He understands will need insurance auth.   Patient has been to [facility] in past and he prefers her to go there. Explained TOC Team will fax referral out to surrounding SNF's and provide bed offers once received

## 2023-02-07 NOTE — Progress Notes (Addendum)
 Patient ID: Lori [name], female   DOB: 1970-12-04, 53 y.o.   MRN: [id number]  She is very poor historian and says she has had epistaxis all her life. She has not had any bleeding.   No lesions visible in nose. No bleeding.   Call if she has any further bleeding. The MRI does not show any sinus or nose lesions. SABRA

## 2023-02-07 NOTE — Progress Notes (Signed)
 Pt arrived back to 6 north room 3

## 2023-02-07 NOTE — Progress Notes (Signed)
 Physical Therapy Treatment Patient Details Name: Lori [name] MRN: [id number] DOB: March 19, 1970 Today's Date: 02/07/2023   History of Present Illness 53 y.o. female presents to [facility] 02/04/23 with n/v with coffee ground emesis. S/p EGD on 1/2 w/ erosive esophagitis noted as cause of bleeding with 1 cm hiatal hernia. Awaiting pathology for biopsied polyp. Pt given 1 unit PRBC due to hypotension, low Hgb, and R nose bleed after EGD. Pt also with + Covid diagnosis. Multiple prior admits for acute encephalopathy and gastroparesis. 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 Pt needed increased physical assistance, MaxAx2, to stand and take a few steps in the room with [facility]. Pt likely fatigued after sitting in recliner for 1 hour. Pt had decreased L hip flexion and L ankle DF when taking steps and needed cues for sequencing. Pt required MaxAx2 to return to sidelying on right side for comfort. Pt is progressing towards goals. Acute PT to follow.      If plan is discharge home, recommend the following: A lot of help with walking and/or transfers;A lot of help with bathing/dressing/bathroom;Assistance with cooking/housework;Direct supervision/assist for medications management;Direct supervision/assist for financial management;Assist for transportation;Help with stairs or ramp for entrance   Can travel by private vehicle     No  Equipment Recommendations  Other (comment) (TBD at next venue)       Precautions / Restrictions Precautions Precautions: Fall Precaution Comments: L sided weakness from prior CVA, Covid+ Restrictions Weight Bearing Restrictions Per Provider Order: No     Mobility  Bed Mobility Overal bed mobility: Needs Assistance Bed Mobility: Sit to  Supine     Supine to sit: Max assist, HOB elevated Sit to supine: Max assist, +2 for physical assistance   General bed mobility comments: MaxAx2 for sit/supine transfer for LE management and trunk control    Transfers Overall transfer level: Needs assistance Equipment used: 2 person hand held assist Transfers: Sit to/from Stand Sit to Stand: +2 physical assistance, Max assist      General transfer comment: MaxAx2 required to stand with [facility]. Increased assistance needed for boost up, rise, and steadying    Ambulation/Gait Ambulation/Gait assistance: +2 physical assistance, Max assist Gait Distance (Feet): 4 Feet Assistive device: 2 person hand held assist Gait Pattern/deviations: Step-to pattern, Decreased step length - left, Decreased dorsiflexion - left, Decreased stance time - left, Shuffle Gait velocity: dec     General Gait Details: Increased physical assistance needed after sitting in recliner for ~1 hr. MaxAx2 for steadying and UE support to take steps. Decreased ability to slide LLE forwards w/ limited hip flexion & ankle DF. Cues for technique       Balance Overall balance assessment: Needs assistance, Mild deficits observed, not formally tested Sitting-balance support: Single extremity supported, Feet supported Sitting balance-Leahy Scale: Fair Sitting balance - Comments: Initially needs MinA for posterior and right lateral lean. Occasionally needs CGA, however, will fatigue and need support Postural control: Posterior lean, Right lateral lean Standing balance support: Bilateral upper extremity supported, During functional activity, Reliant on assistive device for balance Standing balance-Leahy Scale: Zero Standing balance comment: reliant on UE support and external support       Cognition Arousal: Alert Behavior During Therapy: WFL for tasks assessed/performed Overall Cognitive Status: No family/caregiver  present to determine baseline cognitive functioning         General Comments General comments (skin integrity, edema, etc.): VSS on RA, preferred to sleep on R side with pillow wedged posteriorly and support for L shoulder      Pertinent Vitals/Pain Pain Assessment Pain Assessment: No/denies pain    Home Living Family/patient expects to be discharged to:: Private residence Living Arrangements: Spouse/significant other Available Help at Discharge: Family;Available PRN/intermittently;Personal care attendant Type of Home: Apartment Home Access: Level entry       Home Layout: One level Home Equipment: Hospital bed;Wheelchair - manual;Tub bench Additional Comments: Aide daily for 3 hours/day. pt's husband works during the day.    Prior Function            PT Goals (current goals can now be found in the care plan section) Acute Rehab PT Goals Patient Stated Goal: to get better PT Goal Formulation: With patient Time For Goal Achievement: 02/21/23 Potential to Achieve Goals: Fair    Frequency    Min 1X/week       AM-PAC PT 6 Clicks Mobility   Outcome Measure  Help needed turning from your back to your side while in a flat bed without using bedrails?: A Lot Help needed moving from lying on your back to sitting on the side of a flat bed without using bedrails?: A Lot Help needed moving to and from a bed to a chair (including a wheelchair)?: Total Help needed standing up from a chair using your arms (e.g., wheelchair or bedside chair)?: Total Help needed to walk in hospital room?: Total Help needed climbing 3-5 steps with a railing? : Total 6 Click Score: 8    End of Session Equipment Utilized During Treatment: Gait belt Activity Tolerance: Patient limited by fatigue Patient left: with call bell/phone within reach;in bed;with bed alarm set Nurse Communication: Mobility status PT Visit Diagnosis: Unsteadiness on feet (R26.81);Muscle weakness (generalized) (M62.81)     Time: 1036-1050 PT Time Calculation (min) (ACUTE  ONLY): 14 min  Charges:    $Gait Training: 8-22 mins PT General Charges $$ ACUTE PT VISIT: 1 Visit           Kate ORN, PT, DPT Secure Chat Preferred  Rehab Office [phone]    Kate BRAVO Wendolyn 02/07/2023, 11:58 AM

## 2023-02-07 NOTE — Hospital Course (Signed)
 53 year old unfortunate female history of diabetes type 2 with gastroparesis, CAD status post CABG, chronic diastolic heart failure, OSA on CPAP, history of CVA residual left-sided weakness currently bedbound, history of DVT on Xarelto , hypothyroidism, multiple recent hospitalizations last one 01/30/2023 for transient acute encephalopathy left facial droop, aphasia with unremarkable workup with discontinuation of amlodipine  to avoid hypotension presented to the ED with coffee-ground emesis.  Patient seen in consultation for GI and patient for upper endoscopy.  Patient also noted to have incidental COVID and started on Paxlovid .

## 2023-02-07 NOTE — Evaluation (Signed)
 Occupational Therapy Evaluation Patient Details Name: Lori [name] MRN: [id number] DOB: 1970/10/27 Today's Date: 02/07/2023   History of Present Illness 53 y.o. female presents to [facility] 02/04/23 with n/v with coffee ground emesis. S/p EGD on 1/2 w/ erosive esophagitis noted as cause of bleeding with 1 cm hiatal hernia. Awaiting pathology for biopsied polyp. Pt given 1 unit PRBC due to hypotension, low Hgb, and R nose bleed after EGD. Pt also with + Covid diagnosis. Multiple prior admits for acute encephalopathy and gastroparesis. 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  Admitted for above and presents with problem list below.  She has hx of chronic L sided hemiparesis from CVA.  She is able to follow commands with increased time but some decreased attention.  Requires mod assist +2 for transfers, min to total assist +2 for ADLs.  Based on performance today, believe patient will best benefit from continued OT services acutely and after dc at inpatient setting with <3hrs/day to optimize independence, safety with ADLs and mobility.        If plan is discharge home, recommend the following: Two people to help with walking and/or transfers;A lot of help with bathing/dressing/bathroom;Assistance with cooking/housework;Assist for transportation;Help with stairs or ramp for entrance;Direct supervision/assist for medications management;Direct supervision/assist for financial management    Functional Status Assessment  Patient has had a recent decline in their functional status and demonstrates the ability to make significant improvements in function in a  reasonable and predictable amount of time.  Equipment Recommendations  Other (comment) (defer)    Recommendations for Other Services       Precautions / Restrictions Precautions Precautions: Fall Precaution Comments: L sided weakness from prior CVA, Covid+ Restrictions Weight Bearing Restrictions Per Provider Order: No      Mobility Bed Mobility Overal bed mobility: Needs Assistance Bed Mobility: Supine to Sit     Supine to sit: Max assist, HOB elevated     General bed mobility comments: Pt able to move R LE towards EOB, required assist for trunk elevation and to move B LE's off EOB. Pt able to push to sit with R UE. Needed MaxA with use of bed pad to shift hips forward    Transfers Overall transfer level: Needs assistance Equipment used: 2 person hand held assist Transfers: Sit to/from Stand Sit to Stand: Mod assist, +2 physical assistance, Min assist           General transfer comment: x2 stands with ModAx2 and x1 stand with MinAx2. Assist for boost up and steadying with increased time. Pt able to hold onto posterior elbow with R UE, L UE support by therapist.      Balance Overall balance assessment: Needs assistance, Mild deficits observed, not formally tested Sitting-balance support: Single extremity supported, Feet supported Sitting balance-Leahy Scale: Fair Sitting balance - Comments: Initially needs MinA for posterior and right lateral lean. Occasionally needs CGA, however, will fatigue and need support Postural control: Posterior lean, Right lateral lean Standing balance support: Bilateral upper extremity supported, During functional activity, Reliant on assistive device for balance Standing balance-Leahy Scale: Poor Standing balance comment: reliant on UE support and external support  ADL either performed or assessed with clinical judgement   ADL Overall ADL's : Needs assistance/impaired     Grooming: Set  up;Sitting           Upper Body Dressing : Minimal assistance;Sitting   Lower Body Dressing: +2 for physical assistance;Sit to/from stand;Total assistance   Toilet Transfer: Moderate assistance;+2 for physical assistance Toilet Transfer Details (indicate cue type and reason): stand step to recliner         Functional mobility during ADLs: Moderate assistance;+2 for physical assistance       Vision   Vision Assessment?: No apparent visual deficits     Perception         Praxis         Pertinent Vitals/Pain Pain Assessment Pain Assessment: No/denies pain     Extremity/Trunk Assessment Upper Extremity Assessment Upper Extremity Assessment: Generalized weakness;LUE deficits/detail LUE Deficits / Details: hx of CVA with spastic hemiparesis, able to actively flex/extend elbow and raise shoulder minimally but hand remains in flexed position.  Pt reports no use of splint. LUE Sensation: decreased light touch;decreased proprioception LUE Coordination: decreased fine motor;decreased gross motor   Lower Extremity Assessment Lower Extremity Assessment: Defer to PT evaluation RLE Deficits / Details: generalized weakness, alert to light touch LLE Deficits / Details: limited hip flexion against gravity, WFL knee ROM, at least 3/5, limited ankle DF/PF ROM. Alert to light touch   Cervical / Trunk Assessment Cervical / Trunk Assessment: Normal   Communication Communication Communication: Difficulty communicating thoughts/reduced clarity of speech Cueing Techniques: Verbal cues   Cognition Arousal: Alert Behavior During Therapy: WFL for tasks assessed/performed Overall Cognitive Status: No family/caregiver present to determine baseline cognitive functioning                                 General Comments: patient follows simple commands but at times requires redirection to task.     General Comments  VSS on RA, placed supportive blanketse under L UE     Exercises     Shoulder Instructions      Home Living Family/patient expects to be discharged to:: Private residence Living Arrangements: Spouse/significant other Available Help at Discharge: Family;Available PRN/intermittently;Personal care attendant Type of Home: Apartment Home Access: Level entry     Home Layout: One level     Bathroom Shower/Tub: Chief Strategy Officer: Standard     Home Equipment: Hospital bed;Wheelchair - manual;Tub bench   Additional Comments: Aide daily for 3 hours/day. pt's husband works during the day.      Prior Functioning/Environment Prior Level of Function : Needs assist       Physical Assist : Mobility (physical);ADLs (physical) Mobility (physical): Bed mobility;Transfers;Gait ADLs (physical): Grooming;Bathing;Dressing;Toileting;IADLs Mobility Comments: getting HHPT, reports walking with a L platfrom walker household distances with support from PT; reports staying in bed if alone. always has assist for transfers ADLs Comments: has home aide 3-4 hours/day 7 days per week, largely bed bound with transfers to [facility] with assist, has home purewick system.  Patient reports able to complete mouth care and eating without assist but aide assists with all other ADLs.        OT Problem List: Decreased strength;Decreased range of motion;Decreased activity tolerance;Impaired balance (sitting and/or standing);Decreased coordination;Decreased cognition;Decreased safety awareness;Decreased knowledge of use of DME or AE;Decreased knowledge of precautions;Obesity;Impaired UE functional use      OT Treatment/Interventions: Therapeutic exercise;Self-care/ADL training;DME and/or AE instruction;Therapeutic activities;Patient/family education;Balance  training    OT Goals(Current goals can be found in the care plan section) Acute Rehab OT Goals Patient Stated Goal: get better OT Goal Formulation: With patient Time For Goal Achievement:  02/21/23 Potential to Achieve Goals: Good  OT Frequency: Min 1X/week    Co-evaluation PT/OT/SLP Co-Evaluation/Treatment: Yes Reason for Co-Treatment: Complexity of the patient's impairments (multi-system involvement);For patient/therapist safety;To address functional/ADL transfers PT goals addressed during session: Mobility/safety with mobility;Balance OT goals addressed during session: ADL's and self-care      AM-PAC OT 6 Clicks Daily Activity     Outcome Measure Help from another person eating meals?: A Little Help from another person taking care of personal grooming?: A Little Help from another person toileting, which includes using toliet, bedpan, or urinal?: Total Help from another person bathing (including washing, rinsing, drying)?: A Lot Help from another person to put on and taking off regular upper body clothing?: A Lot Help from another person to put on and taking off regular lower body clothing?: Total 6 Click Score: 12   End of Session Equipment Utilized During Treatment: Gait belt Nurse Communication: Mobility status  Activity Tolerance: Patient tolerated treatment well Patient left: in chair;with call bell/phone within reach;with chair alarm set  OT Visit Diagnosis: Other abnormalities of gait and mobility (R26.89);Muscle weakness (generalized) (M62.81);Other symptoms and signs involving the nervous system (R29.898)                Time: 9141-9065 OT Time Calculation (min): 36 min Charges:  OT General Charges $OT Visit: 1 Visit OT Evaluation $OT Eval Moderate Complexity: 1 Mod  Etta NOVAK, OT Acute Rehabilitation Services Office [phone]   Etta GORMAN Hope 02/07/2023, 12:22 PM

## 2023-02-07 NOTE — Progress Notes (Signed)
 Tele battery changed

## 2023-02-08 DIAGNOSIS — R1084 Generalized abdominal pain: Secondary | ICD-10-CM | POA: Diagnosis not present

## 2023-02-08 DIAGNOSIS — K922 Gastrointestinal hemorrhage, unspecified: Secondary | ICD-10-CM | POA: Diagnosis not present

## 2023-02-08 LAB — BPAM RBC
Blood Product Expiration Date: 202501302359
Blood Product Expiration Date: 202501302359
ISSUE DATE / TIME: 202501021040
ISSUE DATE / TIME: 202501021040
Unit Type and Rh: 5100
Unit Type and Rh: 5100

## 2023-02-08 LAB — TYPE AND SCREEN
ABO/RH(D): O POS
Antibody Screen: NEGATIVE
Unit division: 0
Unit division: 0

## 2023-02-08 LAB — CBC
HCT: 32.7 % — ABNORMAL LOW (ref 36.0–46.0)
Hemoglobin: 10.7 g/dL — ABNORMAL LOW (ref 12.0–15.0)
MCH: 25.4 pg — ABNORMAL LOW (ref 26.0–34.0)
MCHC: 32.7 g/dL (ref 30.0–36.0)
MCV: 77.5 fL — ABNORMAL LOW (ref 80.0–100.0)
Platelets: 306 10*3/uL (ref 150–400)
RBC: 4.22 MIL/uL (ref 3.87–5.11)
RDW: 18.3 % — ABNORMAL HIGH (ref 11.5–15.5)
WBC: 8.6 10*3/uL (ref 4.0–10.5)
nRBC: 0 % (ref 0.0–0.2)

## 2023-02-08 LAB — COMPREHENSIVE METABOLIC PANEL
ALT: 14 U/L (ref 0–44)
AST: 20 U/L (ref 15–41)
Albumin: 3 g/dL — ABNORMAL LOW (ref 3.5–5.0)
Alkaline Phosphatase: 51 U/L (ref 38–126)
Anion gap: 8 (ref 5–15)
BUN: 9 mg/dL (ref 6–20)
CO2: 20 mmol/L — ABNORMAL LOW (ref 22–32)
Calcium: 8.6 mg/dL — ABNORMAL LOW (ref 8.9–10.3)
Chloride: 112 mmol/L — ABNORMAL HIGH (ref 98–111)
Creatinine, Ser: 1.06 mg/dL — ABNORMAL HIGH (ref 0.44–1.00)
GFR, Estimated: >60 mL/min (ref >60)
Glucose, Bld: 119 mg/dL — ABNORMAL HIGH (ref 70–99)
Potassium: 4.1 mmol/L (ref 3.5–5.1)
Sodium: 140 mmol/L (ref 135–145)
Total Bilirubin: 0.8 mg/dL (ref 0.0–1.2)
Total Protein: 6.4 g/dL — ABNORMAL LOW (ref 6.5–8.1)

## 2023-02-08 LAB — GLUCOSE, CAPILLARY
Glucose-Capillary: 92 mg/dL (ref 70–99)
Glucose-Capillary: 144 mg/dL — ABNORMAL HIGH (ref 70–99)
Glucose-Capillary: 123 mg/dL — ABNORMAL HIGH (ref 70–99)
Glucose-Capillary: 214 mg/dL — ABNORMAL HIGH (ref 70–99)

## 2023-02-08 MED ORDER — PANTOPRAZOLE SODIUM 40 MG PO TBEC
40.0000 mg | DELAYED_RELEASE_TABLET | Freq: Two times a day (BID) | ORAL | Status: DC
  Administered 2023-02-08 – 2023-02-13 (×10): 40 mg via ORAL
  Filled 2023-02-08 (×10): qty 1

## 2023-02-08 MED ORDER — ENOXAPARIN SODIUM 40 MG/0.4ML IJ SOSY
40.0000 mg | PREFILLED_SYRINGE | Freq: Every day | INTRAMUSCULAR | Status: AC
  Filled 2023-02-08 (×2): qty 0.4

## 2023-02-08 MED ORDER — RIVAROXABAN 10 MG PO TABS
10.0000 mg | ORAL_TABLET | Freq: Every day | ORAL | Status: DC
  Administered 2023-02-10 – 2023-02-13 (×4): 10 mg via ORAL
  Filled 2023-02-08 (×4): qty 1

## 2023-02-08 MED ORDER — FUROSEMIDE 10 MG/ML IJ SOLN
40.0000 mg | Freq: Once | INTRAMUSCULAR | Status: AC
  Administered 2023-02-08: 40 mg via INTRAVENOUS
  Filled 2023-02-08: qty 4

## 2023-02-08 MED ORDER — CLOPIDOGREL BISULFATE 75 MG PO TABS
75.0000 mg | ORAL_TABLET | Freq: Every day | ORAL | Status: DC
Start: 2023-02-08 — End: 2023-02-13
  Administered 2023-02-08 – 2023-02-13 (×6): 75 mg via ORAL
  Filled 2023-02-08 (×6): qty 1

## 2023-02-08 MED ORDER — ENOXAPARIN SODIUM 80 MG/0.8ML IJ SOSY: 1.0000 mg/kg | PREFILLED_SYRINGE | Freq: Two times a day (BID) | INTRAMUSCULAR | Status: DC

## 2023-02-08 NOTE — Progress Notes (Signed)
 Progress Note   Patient: Lori [name] FMW:[id number] DOB: 03/29/1970 DOA: 02/04/2023     3 DOS: the patient was seen and examined on 02/08/2023   Brief hospital course: 53 year old unfortunate female history of diabetes type 2 with gastroparesis, CAD status post CABG, chronic diastolic heart failure, OSA on CPAP, history of CVA residual left-sided weakness currently bedbound, history of DVT on Xarelto , hypothyroidism, multiple recent hospitalizations last one 01/30/2023 for transient acute encephalopathy left facial droop, aphasia with unremarkable workup with discontinuation of amlodipine  to avoid hypotension presented to the ED with coffee-ground emesis.  Patient seen in consultation for GI and patient for upper endoscopy.  Patient also noted to have incidental COVID and started on Paxlovid .    Assessment and Plan: #1 hematemesis/coffee-ground emesis/?  Melanotic stools -Patient presented with a 1 day history of coffee-ground emesis/hematemesis noted to have a bout of hematemesis in the ED per ED physician. -Patient noted to be chronically on Xarelto  and Plavix . -pt now s/p EGD with findings of erosive esophagitis, likely source of bleed -Would restart xarelto  with plavix  on 1/6 after finishing paxlovid  -Hgb stable at 10.7   2.  Dehydration -Pt was given IV fluids. -Now appears overloaded. Give one dose of lasix    3.  Hyperthyroidism -Continue home regimen Tapazole  and metoprolol .   4.  Sinus tachycardia -Likely secondary to history of hyperthyroidism patient likely has not received her beta-blocker on day of admission. -Heart rate improved with resumption of home regimen Lopressor .   -IV fluids, supportive care.   5.  Incidental COVID-19 -Patient noted to have a positive SARS coronavirus COVID-19 PCR on admission. -Patient with complained of congestion cough x 4 days. -CRP elevated but trending down -Patient currently not hypoxic with sats of 100% on room  air. -Currently not symptomatic. Would complete abbreviated course of paxlovid  to complete tonight -Continue scheduled Combivent, Flonase , Claritin . -Cont supportive care    6.  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 -Continue to hold Plavix  and Xarelto  once off paxlovid  x 24-48hrs, plan on 1/6 -Hold statin due to possible interactions with Xarelto . -PT/OT/SLP   7.  Diabetes mellitus type 2 with gastroparesis -Hemoglobin A1c 8.5 (11/24/2022) -CBG noted at 77 this morning. -Patient currently n.p.o. in anticipation of EGD this morning. -Continue decreased/half home dose of long-acting insulin  of Semglee  10 units nightly, SSI.  -Continue Reglan . -Follow.   8.  GERD -PPI.   9.  MDD -Stable.   -Lexapro .    10.  CAD/status post CABG -Stable. -Continue home cardiac regimen.   11.  Hyperlipidemia -Continue to hold statin secondary to interaction with Paxlovid . -Resume statin after completion of Paxlovid .   12.  OSA on CPAP -will continue cpap   13.  Hypomagnesemia -given replacement -Cont to follow levels   14.  Metabolic acidosis -Patient with bicarb trending down currently at 15 from 19 from 21. -Pt was given course of bicarb. Recheck bmet in AM   15.  Morbid obesity -BMI 32.92 kg/m -Lifestyle modification -Outpatient follow-up with PCP.  16. Nose bleed -Seen by ENT -No masses seen on MRI as well as CT maxillofacial as was originally recommended by ENT, reviewed -ENT signed off. Bleeding has stopped   Subjective: Difficult to assess given deficits. Does report being finished with her breakfast this AM  Physical Exam: Vitals:   02/07/23 2014 02/08/23 0453 02/08/23 0455 02/08/23 0818  BP: 98/75 94/65  116/72  Pulse: 74 84  74  Resp: 18 18  18  Temp: (!) 97.4 F (36.3 C) 97.7 F (36.5 C)  97.9 F (36.6 C)  TempSrc: Oral Oral  Axillary  SpO2: 100% 97%  98%  Weight:   81 kg   Height:       General exam: Somewhat  conversant, in no acute distress Respiratory system: normal chest rise, clear, no audible wheezing Cardiovascular system: regular rhythm, s1-s2 Gastrointestinal system: Nondistended, nontender Central nervous system: No seizures, no tremors Extremities: No cyanosis, no joint deformities Skin: No rashes, no pallor Psychiatry: Difficult to assess given deficits  Data Reviewed:  Labs reviewed: Na 140, K 4.1, Cr 1.06, WBC 8.6, Hgb 10.7, K 306  Family Communication: Pt in room, family not at bedside  Disposition: Status is: Inpatient Remains inpatient appropriate because: severity of illness  Planned Discharge Destination: Skilled nursing facility    Author: Garnette Pelt, MD 02/08/2023 4:20 PM  For on call review [email or web address].

## 2023-02-08 NOTE — Plan of Care (Signed)
   Problem: Coping: Goal: Ability to adjust to condition or change in health will improve Outcome: Progressing   Problem: Metabolic: Goal: Ability to maintain appropriate glucose levels will improve Outcome: Progressing   Problem: Nutritional: Goal: Maintenance of adequate nutrition will improve Outcome: Progressing

## 2023-02-09 DIAGNOSIS — K922 Gastrointestinal hemorrhage, unspecified: Secondary | ICD-10-CM | POA: Diagnosis not present

## 2023-02-09 DIAGNOSIS — R1084 Generalized abdominal pain: Secondary | ICD-10-CM | POA: Diagnosis not present

## 2023-02-09 LAB — COMPREHENSIVE METABOLIC PANEL
ALT: 15 U/L (ref 0–44)
AST: 17 U/L (ref 15–41)
Albumin: 2.8 g/dL — ABNORMAL LOW (ref 3.5–5.0)
Alkaline Phosphatase: 49 U/L (ref 38–126)
Anion gap: 11 (ref 5–15)
BUN: 9 mg/dL (ref 6–20)
CO2: 20 mmol/L — ABNORMAL LOW (ref 22–32)
Calcium: 8.3 mg/dL — ABNORMAL LOW (ref 8.9–10.3)
Chloride: 107 mmol/L (ref 98–111)
Creatinine, Ser: 0.85 mg/dL (ref 0.44–1.00)
GFR, Estimated: >60 mL/min (ref >60)
Glucose, Bld: 147 mg/dL — ABNORMAL HIGH (ref 70–99)
Potassium: 4 mmol/L (ref 3.5–5.1)
Sodium: 138 mmol/L (ref 135–145)
Total Bilirubin: 0.4 mg/dL (ref 0.0–1.2)
Total Protein: 6.6 g/dL (ref 6.5–8.1)

## 2023-02-09 LAB — GLUCOSE, CAPILLARY
Glucose-Capillary: 102 mg/dL — ABNORMAL HIGH (ref 70–99)
Glucose-Capillary: 120 mg/dL — ABNORMAL HIGH (ref 70–99)
Glucose-Capillary: 112 mg/dL — ABNORMAL HIGH (ref 70–99)
Glucose-Capillary: 152 mg/dL — ABNORMAL HIGH (ref 70–99)
Glucose-Capillary: 154 mg/dL — ABNORMAL HIGH (ref 70–99)

## 2023-02-09 LAB — CBC
HCT: 32.1 % — ABNORMAL LOW (ref 36.0–46.0)
Hemoglobin: 10.7 g/dL — ABNORMAL LOW (ref 12.0–15.0)
MCH: 25.3 pg — ABNORMAL LOW (ref 26.0–34.0)
MCHC: 33.3 g/dL (ref 30.0–36.0)
MCV: 75.9 fL — ABNORMAL LOW (ref 80.0–100.0)
Platelets: 368 10*3/uL (ref 150–400)
RBC: 4.23 MIL/uL (ref 3.87–5.11)
RDW: 18.1 % — ABNORMAL HIGH (ref 11.5–15.5)
WBC: 7.1 10*3/uL (ref 4.0–10.5)
nRBC: 0 % (ref 0.0–0.2)

## 2023-02-09 NOTE — Plan of Care (Signed)
  Problem: Education: Goal: Ability to describe self-care measures that may prevent or decrease complications (Diabetes Survival Skills Education) will improve Outcome: Progressing Goal: Individualized Educational Video(s) Outcome: Progressing   Problem: Coping: Goal: Ability to adjust to condition or change in health will improve Outcome: Progressing   Problem: Fluid Volume: Goal: Ability to maintain a balanced intake and output will improve Outcome: Progressing   Problem: Health Behavior/Discharge Planning: Goal: Ability to identify and utilize available resources and services will improve Outcome: Progressing Goal: Ability to manage health-related needs will improve Outcome: Progressing   Problem: Metabolic: Goal: Ability to maintain appropriate glucose levels will improve Outcome: Progressing   Problem: Nutritional: Goal: Maintenance of adequate nutrition will improve Outcome: Progressing Goal: Progress toward achieving an optimal weight will improve Outcome: Progressing   Problem: Skin Integrity: Goal: Risk for impaired skin integrity will decrease Outcome: Progressing   Problem: Tissue Perfusion: Goal: Adequacy of tissue perfusion will improve Outcome: Progressing   Problem: Education: Goal: Knowledge of risk factors and measures for prevention of condition will improve Outcome: Progressing   Problem: Coping: Goal: Psychosocial and spiritual needs will be supported Outcome: Progressing   Problem: Respiratory: Goal: Will maintain a patent airway Outcome: Progressing Goal: Complications related to the disease process, condition or treatment will be avoided or minimized Outcome: Progressing   Problem: Education: Goal: Knowledge of General Education information will improve Description: Including pain rating scale, medication(s)/side effects and non-pharmacologic comfort measures Outcome: Progressing   Problem: Health Behavior/Discharge Planning: Goal:  Ability to manage health-related needs will improve Outcome: Progressing   Problem: Clinical Measurements: Goal: Ability to maintain clinical measurements within normal limits will improve Outcome: Progressing Goal: Will remain free from infection Outcome: Progressing Goal: Diagnostic test results will improve Outcome: Progressing Goal: Respiratory complications will improve Outcome: Progressing Goal: Cardiovascular complication will be avoided Outcome: Progressing   Problem: Activity: Goal: Risk for activity intolerance will decrease Outcome: Progressing   Problem: Nutrition: Goal: Adequate nutrition will be maintained Outcome: Progressing   Problem: Coping: Goal: Level of anxiety will decrease Outcome: Progressing   Problem: Elimination: Goal: Will not experience complications related to bowel motility Outcome: Progressing Goal: Will not experience complications related to urinary retention Outcome: Progressing   Problem: Pain Management: Goal: General experience of comfort will improve Outcome: Progressing   Problem: Safety: Goal: Ability to remain free from injury will improve Outcome: Progressing   Problem: Skin Integrity: Goal: Risk for impaired skin integrity will decrease Outcome: Progressing

## 2023-02-09 NOTE — Progress Notes (Signed)
 Pt educated on the need for Lovenox and pt still refuses to take the medication.  Dr Rhona Leavens made aware and will also talk to the pt about the importance of the medication.

## 2023-02-09 NOTE — Progress Notes (Signed)
 Progress Note   Patient: Lori [name] FMW:[id number] DOB: 1970/06/24 DOA: 02/04/2023     4 DOS: the patient was seen and examined on 02/09/2023   Brief hospital course: 53 year old unfortunate female history of diabetes type 2 with gastroparesis, CAD status post CABG, chronic diastolic heart failure, OSA on CPAP, history of CVA residual left-sided weakness currently bedbound, history of DVT on Xarelto , hypothyroidism, multiple recent hospitalizations last one 01/30/2023 for transient acute encephalopathy left facial droop, aphasia with unremarkable workup with discontinuation of amlodipine  to avoid hypotension presented to the ED with coffee-ground emesis.  Patient seen in consultation for GI and patient for upper endoscopy.  Patient also noted to have incidental COVID and started on Paxlovid .    Assessment and Plan: #1 hematemesis/coffee-ground emesis/?  Melanotic stools -Patient presented with a 1 day history of coffee-ground emesis/hematemesis noted to have a bout of hematemesis in the ED per ED physician. -Patient noted to be chronically on Xarelto  and Plavix . -pt now s/p EGD with findings of erosive esophagitis, likely source of bleed -Anticipate restarting xarelto  with plavix  on 1/6 after finishing paxlovid  dose 1/4 -Hgb stable at 10.7   2.  Dehydration -Pt was earlier given IV fluids. -Later appeared overloaded. Give one dose of lasix  on 1/4 -Renal function remains stable   3.  Hyperthyroidism -Continue home regimen Tapazole  and metoprolol .   4.  Sinus tachycardia -Likely secondary to history of hyperthyroidism patient likely has not received her beta-blocker on day of admission. -Heart rate improved with resumption of home regimen Lopressor .   -IV fluids, supportive care.   5.  Incidental COVID-19 -Patient noted to have a positive SARS coronavirus COVID-19 PCR on admission. -Patient with complained of congestion cough x 4 days. -CRP elevated but trending  down -Patient currently not hypoxic with sats of 100% on room air. -Currently not symptomatic. Completed paxlovid  on 1/4 -Continue scheduled Combivent, Flonase , Claritin . -Cont supportive care    6.  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 -Continue to hold Plavix  and Xarelto  once off paxlovid  x 24-48hrs, plan to resume on 1/6 -Hold statin due to possible interactions with Xarelto . -PT/OT/SLP   7.  Diabetes mellitus type 2 with gastroparesis -Hemoglobin A1c 8.5 (11/24/2022) -Continue decreased/half home dose of long-acting insulin  of Semglee  10 units nightly, SSI.  -Pt is tolerating PO -Continue Reglan . -Glycemic trends stable   8.  GERD -PPI.   9.  MDD -Stable.   -Lexapro .    10.  CAD/status post CABG -Stable. -Continue home cardiac regimen.   11.  Hyperlipidemia -Continue to hold statin secondary to interaction with Paxlovid . -Resume statin after completion of Paxlovid .   12.  OSA on CPAP -will continue cpap   13.  Hypomagnesemia -given replacement -Cont to follow levels   14.  Metabolic acidosis -Pt was given course of bicarb.  -serum bicarb improved to 20   15.  Morbid obesity -BMI 32.92 kg/m -Lifestyle modification -Outpatient follow-up with PCP.  16. Nose bleed -Seen by ENT -No masses seen on MRI as well as CT maxillofacial as was originally recommended by ENT, reviewed -ENT signed off. Bleeding has stopped   Subjective: Difficult to assess given deficits, however pt did convey that she did not wish to eat anymore breakfast this AM when seen  Physical Exam: Vitals:   02/08/23 2109 02/09/23 0435 02/09/23 0437 02/09/23 0842  BP: 104/74 92/74  91/69  Pulse: 80 74  78  Resp:  18  17  Temp:  98.1  F (36.7 C)  98.3 F (36.8 C)  TempSrc:  Oral  Oral  SpO2:  98%  99%  Weight:   85.5 kg   Height:       General exam: Awake, laying in bed, in nad Respiratory system: Normal respiratory effort, no  wheezing Cardiovascular system: regular rate, s1, s2 Gastrointestinal system: Soft, nondistended, positive BS Central nervous system: No tremors, dysarthric Extremities: Perfused, no clubbing Skin: Normal skin turgor, no notable skin lesions seen Psychiatry: Mood normal // no visual hallucinations   Data Reviewed:  Labs reviewed: Na 138, K 4.0, Cr 0.85, WBC 7.1, Hgb 10.7  Family Communication: Pt in room, family not at bedside  Disposition: Status is: Inpatient Remains inpatient appropriate because: severity of illness  Planned Discharge Destination: Skilled nursing facility    Author: Garnette Pelt, MD 02/09/2023 4:38 PM  For on call review [email or web address].

## 2023-02-10 DIAGNOSIS — U071 COVID-19: Secondary | ICD-10-CM

## 2023-02-10 DIAGNOSIS — K920 Hematemesis: Secondary | ICD-10-CM | POA: Diagnosis not present

## 2023-02-10 DIAGNOSIS — R040 Epistaxis: Secondary | ICD-10-CM

## 2023-02-10 DIAGNOSIS — Z86718 Personal history of other venous thrombosis and embolism: Secondary | ICD-10-CM

## 2023-02-10 DIAGNOSIS — E1143 Type 2 diabetes mellitus with diabetic autonomic (poly)neuropathy: Secondary | ICD-10-CM | POA: Diagnosis not present

## 2023-02-10 DIAGNOSIS — I5032 Chronic diastolic (congestive) heart failure: Secondary | ICD-10-CM | POA: Diagnosis not present

## 2023-02-10 LAB — GLUCOSE, CAPILLARY
Glucose-Capillary: 118 mg/dL — ABNORMAL HIGH (ref 70–99)
Glucose-Capillary: 251 mg/dL — ABNORMAL HIGH (ref 70–99)
Glucose-Capillary: 60 mg/dL — ABNORMAL LOW (ref 70–99)
Glucose-Capillary: 80 mg/dL (ref 70–99)
Glucose-Capillary: 138 mg/dL — ABNORMAL HIGH (ref 70–99)

## 2023-02-10 LAB — COMPREHENSIVE METABOLIC PANEL
ALT: 16 U/L (ref 0–44)
AST: 15 U/L (ref 15–41)
Albumin: 2.6 g/dL — ABNORMAL LOW (ref 3.5–5.0)
Alkaline Phosphatase: 55 U/L (ref 38–126)
Anion gap: 11 (ref 5–15)
BUN: 8 mg/dL (ref 6–20)
CO2: 19 mmol/L — ABNORMAL LOW (ref 22–32)
Calcium: 8.8 mg/dL — ABNORMAL LOW (ref 8.9–10.3)
Chloride: 110 mmol/L (ref 98–111)
Creatinine, Ser: 0.72 mg/dL (ref 0.44–1.00)
GFR, Estimated: >60 mL/min (ref >60)
Glucose, Bld: 97 mg/dL (ref 70–99)
Potassium: 3.8 mmol/L (ref 3.5–5.1)
Sodium: 140 mmol/L (ref 135–145)
Total Bilirubin: 0.5 mg/dL (ref 0.0–1.2)
Total Protein: 6.5 g/dL (ref 6.5–8.1)

## 2023-02-10 LAB — CBC
HCT: 33.1 % — ABNORMAL LOW (ref 36.0–46.0)
Hemoglobin: 10.9 g/dL — ABNORMAL LOW (ref 12.0–15.0)
MCH: 25.5 pg — ABNORMAL LOW (ref 26.0–34.0)
MCHC: 32.9 g/dL (ref 30.0–36.0)
MCV: 77.3 fL — ABNORMAL LOW (ref 80.0–100.0)
Platelets: 366 10*3/uL (ref 150–400)
RBC: 4.28 MIL/uL (ref 3.87–5.11)
RDW: 18.6 % — ABNORMAL HIGH (ref 11.5–15.5)
WBC: 7.1 10*3/uL (ref 4.0–10.5)
nRBC: 0 % (ref 0.0–0.2)

## 2023-02-10 LAB — MAGNESIUM: Magnesium: 2 mg/dL (ref 1.7–2.4)

## 2023-02-10 MED ORDER — METOCLOPRAMIDE HCL 5 MG PO TABS
5.0000 mg | ORAL_TABLET | Freq: Three times a day (TID) | ORAL | Status: DC
  Administered 2023-02-10 – 2023-02-13 (×9): 5 mg via ORAL
  Filled 2023-02-10 (×9): qty 1

## 2023-02-10 NOTE — Progress Notes (Addendum)
 PROGRESS NOTE    Lori [name]  FMW:[id number] DOB: 05-08-1970 DOA: 02/04/2023 PCP: Leonarda [name], NP    Chief Complaint  Patient presents with   Emesis    Vomitting brown emesis since 10 pm, constipated for 3 days.    Brief Narrative:  Patient 53 year old unfortunate female history of diabetes type 2 with gastroparesis, CAD status post CABG, chronic diastolic heart failure, OSA on CPAP, history of CVA residual left-sided weakness currently bedbound, history of DVT on Xarelto , hypothyroidism, multiple recent hospitalizations last one 01/30/2023 for transient acute encephalopathy left facial droop, aphasia with unremarkable workup with discontinuation of amlodipine  to avoid hypotension presented to the ED with coffee-ground emesis.  Patient seen in consultation for GI and patient for upper endoscopy.  Patient also noted to have incidental COVID and started on Paxlovid .   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  Melanotic stools/acute erosive esophagitis -Patient presented with a 1 day history of coffee-ground emesis/hematemesis noted to have a bout of hematemesis in the ED per ED physician. -Patient noted to be chronically on Xarelto  and Plavix . -Patient also does state has had a month of black stools per her aide. -Patient denies any prior history of peptic ulcer disease, no history of NSAID use. -Hemoglobin noted to drop as low as 8.3 from 10.9 on admission on 02/06/2023, during EGD patient noted to have significant epistaxis and transfused a unit of PRBCs with hemoglobin currently stable at 10.9.SABRA -Patient IV fluid boluses in the ED and placed on IV fluids due to dehydration and tachycardia. -Patient received Protonix  80 mg IV push x 1 on presentation to the ED, was on IV PPI twice daily and subsequently transition to oral PPI twice daily.  -Patient seen in consultation by GI underwent upper endoscopy with findings of erosive esophagitis likely source of bleed. -GI recommending PPI twice daily x 6 weeks and then daily thereafter in addition to continuation of Reglan . -Xarelto  and Plavix  held and resumed today 02/10/2023 after finishing Paxlovid . -Will follow H/H -Transfusion threshold hemoglobin < 8. -Will need outpatient follow-up with GI for repeat EGD.   2.  Dehydration -IV fluids.   3.  Hyperthyroidism -Continue home regimen Tapazole  and metoprolol .   4.  Sinus tachycardia -Likely secondary to history of hyperthyroidism patient likely has not received her beta-blocker on day of admission. -Heart rate improved with resumption of home regimen Lopressor .   -IV fluids, supportive care.  5.  Incidental COVID-19 -Patient noted  to have a positive SARS coronavirus COVID-19 PCR on admission. -Patient with complaints of congestion cough x 4 days prior to admission. -CRP elevated but trended down during the hospitalization.  -Patient currently not hypoxic with sats  of 100% on room air. -s/p completion of 5-day course of Paxlovid .  -Continue scheduled Combivent, Flonase , Claritin . -Symptomatic treatment.    6.  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 -Plavix  and Xarelto  were held during the hospitalization due to concern for interactions with Paxlovid .   -Patient is status post completion of Paxlovid  02/08/2023.   -Plavix  and Xarelto  to be resumed today after washout period.   -Continue to hold statin and resume on discharge.   -PT/OT/SLP.    7.  Diabetes mellitus type 2 with gastroparesis -Hemoglobin A1c 8.5 (11/24/2022) -CBG noted at 118 this morning. -Continue decreased home dose of long-acting Semglee  10 units daily, SSI.  -Change IV Reglan  to oral Reglan .  Patient -Follow.   8.  GERD -Continue PPI.    9.  MDD -Lexapro    10.  CAD/status post CABG -Stable. -Continue home cardiac regimen.   11.  Hyperlipidemia -Patient's statin was held while on Paxlovid  and has completed course of Paxlovid .   -Resume statin on discharge.   12.  OSA on CPAP -Due to presentation with nausea and coffee-ground emesis we will hold CPAP for now. -CPAP nightly.  13.  Hypomagnesemia -Status post IV magnesium .   -Repeat magnesium  levels.   14.  Metabolic acidosis -Patient with bicarb trending down currently at 19 from 20 from 20 from 15 from 19 from 21. -Patient currently on bicarb drip which we will continue for another 24 hours then discontinue.  15.  Morbid obesity -BMI 32.92 kg/m -Lifestyle modification -Outpatient follow-up with PCP.  16.  Nosebleed -Seen by ENT. -No masses seen on MRI as well as CT maxillofacial as was originally recommended by ENT and reviewed. -No further bleeding, ENT noted to have signed off. -Place on Afrin as needed.     DVT prophylaxis: SCDs Code Status: Full Family Communication: Updated patient.  No family at bedside. Disposition: Likely back home when clinically  improved and cleared by GI versus SNF and once anticoagulation has been resumed.  No further bleeding.  Status is: Observation The patient will require care spanning > 2 midnights and should be moved to inpatient because: Severity of illness   Consultants:  GI: Dr. Leigh 02/04/2023 ENT: Dr. Roark 02/05/2022  Procedures:  CT abdomen pelvis 02/04/2023 Chest x-ray 02/04/2023 CT maxillofacial 02/07/2023 Transfusion 1 unit PRBCs on 02/06/2023 Upper endoscopy per Dr. Leigh 02/06/2023  Antimicrobials:  Anti-infectives (From admission, onward)    Start     Dose/Rate Route Frequency Ordered Stop   02/05/23 1500  nirmatrelvir /ritonavir  (PAXLOVID ) 3 tablet        3 tablet Oral 2 times daily 02/05/23 0743 02/08/23 2359         Subjective: Patient sitting up in bed.  States she is sleepy.  Stated had amount of nasal bleeding last night which has since resolved.  Denies any chest pain no shortness of breath.  No abdominal pain.  No further coffee-ground emesis or melanotic stools.  Would prefer to go home than go to SNF.   Objective: Vitals:   02/09/23 0842 02/09/23 2130 02/10/23 0445 02/10/23 0845  BP: 91/69 122/88 100/74 95/69  Pulse: 78 84 77 78  Resp: 17 18 20 20   Temp: 98.3 F (36.8 C) 98.2 F (36.8 C) 98.2 F (  36.8 C) 98.3 F (36.8 C)  TempSrc: Oral Oral Oral Oral  SpO2: 99% 99% 100% 100%  Weight:   83.3 kg   Height:       No intake or output data in the 24 hours ending 02/10/23 1041  Filed Weights   02/08/23 0455 02/09/23 0437 02/10/23 0445  Weight: 81 kg 85.5 kg 83.3 kg    Examination:  General exam: NAD Respiratory system: Lungs clear to auscultation bilaterally.  No wheezes, no crackles, no rhonchi.  Fair air movement.  Speaking in full sentences.  Normal respiratory effort.  Cardiovascular system: Regular rate rhythm no murmurs rubs or gallops.  No JVD.  No lower extremity edema. Gastrointestinal system: Abdomen soft, nontender, nondistended, positive bowel  sounds.  No rebound.  No guarding. Central nervous system: Alert and oriented.  Left upper extremity and contractures.  Some dysarthric speech.  5/5 right upper extremity and right lower extremity strength.  3-4/5 left lower extremity strength.  2-3/5 left upper extremity strength.  Extremities: Symmetric 5 x 5 power. Skin: No rashes, lesions or ulcers Psychiatry: Judgement and insight appear normal. Mood & affect appropriate.     Data Reviewed: I have personally reviewed following labs and imaging studies  CBC: Recent Labs  Lab 02/04/23 0758 02/04/23 1112 02/05/23 0744 02/06/23 0757 02/08/23 0435 02/09/23 0608 02/10/23 0529  WBC 9.2   < > 6.5 5.4 8.6 7.1 7.1  NEUTROABS 6.6  --  3.4 2.8  --   --   --   HGB 10.5*   < > 9.0* 8.3* 10.7* 10.7* 10.9*  HCT 32.1*   < > 28.7* 26.9* 32.7* 32.1* 33.1*  MCV 75.2*   < > 76.7* 77.5* 77.5* 75.9* 77.3*  PLT 361   < > 278 294 306 368 366   < > = values in this interval not displayed.    Basic Metabolic Panel: Recent Labs  Lab 02/05/23 0744 02/06/23 0757 02/08/23 0435 02/09/23 1211 02/10/23 0529  NA 136 140 140 138 140  K 3.6 3.8 4.1 4.0 3.8  CL 108 116* 112* 107 110  CO2 19* 15* 20* 20* 19*  GLUCOSE 133* 86 119* 147* 97  BUN 9 7 9 9 8   CREATININE 0.72 0.77 1.06* 0.85 0.72  CALCIUM  7.9* 7.8* 8.6* 8.3* 8.8*  MG  --  1.6*  --   --   --     GFR: Estimated Creatinine Clearance: 84.2 mL/min (by C-G formula based on SCr of 0.72 mg/dL).  Liver Function Tests: Recent Labs  Lab 02/04/23 0758 02/08/23 0435 02/09/23 1211 02/10/23 0529  AST 17 20 17 15   ALT 16 14 15 16   ALKPHOS 63 51 49 55  BILITOT 0.6 0.8 0.4 0.5  PROT 7.3 6.4* 6.6 6.5  ALBUMIN  3.2* 3.0* 2.8* 2.6*    CBG: Recent Labs  Lab 02/09/23 1132 02/09/23 1607 02/09/23 1708 02/09/23 2120 02/10/23 0843  GLUCAP 120* 112* 152* 154* 118*     Recent Results (from the past 240 hours)  Resp panel by RT-PCR (RSV, Flu A&B, Covid) Anterior Nasal Swab     Status:  Abnormal   Collection Time: 02/04/23 10:55 PM   Specimen: Anterior Nasal Swab  Result Value Ref Range Status   SARS Coronavirus 2 by RT PCR POSITIVE (A) NEGATIVE Final   Influenza A by PCR NEGATIVE NEGATIVE Final   Influenza B by PCR NEGATIVE NEGATIVE Final    Comment: (NOTE) The Xpert Xpress SARS-CoV-2/FLU/RSV plus assay is intended as an aid in  the diagnosis of influenza from Nasopharyngeal swab specimens and should not be used as a sole basis for treatment. Nasal washings and aspirates are unacceptable for Xpert Xpress SARS-CoV-2/FLU/RSV testing.  Fact Sheet for Patients: bloggercourse.com  Fact Sheet for Healthcare Providers: seriousbroker.it  This test is not yet approved or cleared by the [place]  FDA and has been authorized for detection and/or diagnosis of SARS-CoV-2 by FDA under an Emergency Use Authorization (EUA). This EUA will remain in effect (meaning this test can be used) for the duration of the COVID-19 declaration under Section 564(b)(1) of the Act, 21 U.S.C. section 360bbb-3(b)(1), unless the authorization is terminated or revoked.     Resp Syncytial Virus by PCR NEGATIVE NEGATIVE Final    Comment: (NOTE) Fact Sheet for Patients: bloggercourse.com  Fact Sheet for Healthcare Providers: seriousbroker.it  This test is not yet approved or cleared by the [place]  FDA and has been authorized for detection and/or diagnosis of SARS-CoV-2 by FDA under an Emergency Use Authorization (EUA). This EUA will remain in effect (meaning this test can be used) for the duration of the COVID-19 declaration under Section 564(b)(1) of the Act, 21 U.S.C. section 360bbb-3(b)(1), unless the authorization is terminated or revoked.  Performed at [facility], [address] [place], [place] [address]          Radiology Studies: No results  found.       Scheduled Meds:  baclofen   10 mg Oral TID   clopidogrel   75 mg Oral Daily   escitalopram   20 mg Oral q morning   ezetimibe   10 mg Oral Daily   fluticasone   2 spray Each Nare Daily   folic acid   1 mg Oral Daily   gabapentin   100 mg Oral TID   insulin  aspart  0-15 Units Subcutaneous TID WC   insulin  glargine-yfgn  10 Units Subcutaneous QHS   loratadine   10 mg Oral Daily   LORazepam   0.5 mg Oral QHS   methimazole   5 mg Oral q morning   metoCLOPramide   5 mg Oral TID AC   metoprolol  tartrate  100 mg Oral BID   multivitamin with minerals  1 tablet Oral Daily   pantoprazole   40 mg Oral BID   polyethylene glycol  17 g Oral Daily   rivaroxaban   10 mg Oral Daily   sodium chloride  flush  3 mL Intravenous Q12H   tamsulosin   0.4 mg Oral QHS   topiramate   25 mg Oral BID   Continuous Infusions:  magnesium  sulfate bolus IVPB     sodium bicarbonate  150 mEq in dextrose  5 % 1,150 mL infusion 100 mL/hr at 02/06/23 1534     LOS: 5 days    Time spent: 40 minutes    Toribio Hummer, MD [facility]   To contact the attending provider between 7A-7P or the covering provider during after hours 7P-7A, please log into the web site [email or web address] and access using universal [place] password for that web site. If you do not have the password, please call the hospital operator.  02/10/2023, 10:41 AM

## 2023-02-10 NOTE — Progress Notes (Signed)
 Physical Therapy Treatment Patient Details Name: Lori [name] MRN: [id number] DOB: 05/28/1970 Today's Date: 02/10/2023   History of Present Illness 53 y.o. female presents to [facility] 02/04/23 with n/v with coffee ground emesis. S/p EGD on 1/2 w/ erosive esophagitis noted as cause of bleeding with 1 cm hiatal hernia. Awaiting pathology for biopsied polyp. Pt given 1 unit PRBC due to hypotension, low Hgb, and R nose bleed after EGD. Pt also with + Covid diagnosis. Multiple prior admits for acute encephalopathy and gastroparesis. 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 Agreeable to participate in physical therapy session with encouragement and declines sitting up in chair. Pt requiring two person maximal assist for bed mobility and transitions to standing. Unable to functionally use left PFRW, which is the assistive device she uses at baseline. Pt reports dizziness; BP 95/69, SpO2 100% on RA, HR 78. Repositioned with bed in chair position to eat breakfast. Patient will benefit from continued inpatient follow up therapy, <3 hours/day.   If plan is discharge home, recommend the following: A lot of help with walking and/or transfers;A lot of help with bathing/dressing/bathroom;Assistance with cooking/housework;Direct supervision/assist for medications management;Direct supervision/assist for financial management;Assist for transportation;Help with stairs or ramp for entrance   Can travel by private vehicle     No  Equipment Recommendations  Other (comment) (defer to next venue)    Recommendations for Other Services       Precautions / Restrictions Precautions Precautions: Fall Precaution Comments: L sided weakness from prior CVA, Covid+ Restrictions Weight  Bearing Restrictions Per Provider Order: No     Mobility  Bed Mobility Overal bed mobility: Needs Assistance Bed Mobility: Supine to Sit, Sit to Supine     Supine to sit: Max assist, +2 for physical assistance Sit to supine: Max assist, +2 for physical assistance   General bed mobility comments: Decreased initiation by pt, cues for moving RLE towards edge of bed, assist with LLE and use of bed pad to pivot hips. Assist for BLE elevation back into bed. +2 to scoot up with bed in Trendelenberg    Transfers Overall transfer level: Needs assistance Equipment used: Left platform walker Transfers: Sit to/from Stand Sit to Stand: Max assist, +2 physical assistance           General transfer comment: MaxA + 2 to stand from edge of bed to left PFRW; pt unable to functionally use.    Ambulation/Gait                   Stairs             Wheelchair Mobility     Tilt Bed    Modified Rankin (Stroke Patients Only)       Balance Overall balance assessment: Needs assistance, Mild deficits observed, not formally tested Sitting-balance support: Feet supported Sitting balance-Leahy Scale: Poor Sitting balance - Comments: With fatigue, pt with posterior lean, requiring min-modA to correct   Standing balance support: Bilateral upper extremity supported Standing balance-Leahy Scale: Zero                              Cognition Arousal: Alert Behavior During Therapy: Flat affect Overall Cognitive Status: No family/caregiver present to determine baseline cognitive functioning  General Comments: Pt follows simple commands with increased time        Exercises      General Comments        Pertinent Vitals/Pain Pain Assessment Pain Assessment: Faces Faces Pain Scale: Hurts little more Pain Location: LUE Pain Descriptors / Indicators: Guarding, Tender Pain Intervention(s): Limited activity within  patient's tolerance, Monitored during session    Home Living                          Prior Function            PT Goals (current goals can now be found in the care plan section) Acute Rehab PT Goals Patient Stated Goal: to get better Potential to Achieve Goals: Fair    Frequency    Min 1X/week      PT Plan      Co-evaluation              AM-PAC PT 6 Clicks Mobility   Outcome Measure  Help needed turning from your back to your side while in a flat bed without using bedrails?: A Lot Help needed moving from lying on your back to sitting on the side of a flat bed without using bedrails?: A Lot Help needed moving to and from a bed to a chair (including a wheelchair)?: Total Help needed standing up from a chair using your arms (e.g., wheelchair or bedside chair)?: Total Help needed to walk in hospital room?: Total Help needed climbing 3-5 steps with a railing? : Total 6 Click Score: 8    End of Session Equipment Utilized During Treatment: Gait belt Activity Tolerance: Patient limited by fatigue Patient left: with call bell/phone within reach;in bed;with bed alarm set Nurse Communication: Mobility status PT Visit Diagnosis: Unsteadiness on feet (R26.81);Muscle weakness (generalized) (M62.81)     Time: 0828-0900 PT Time Calculation (min) (ACUTE ONLY): 32 min  Charges:    $Therapeutic Activity: 23-37 mins PT General Charges $$ ACUTE PT VISIT: 1 Visit                     Aleck [name], PT, DPT Acute Rehabilitation Services Office [phone]    Lori [name] 02/10/2023, 11:15 AM

## 2023-02-10 NOTE — Consult Note (Signed)
 [facility] Liaison Consult Note    02/10/2023  Lori [name] Oct 28, 1970 [id number]  [facility] Consult for post hospital SNF   Primary Care Provider:  Ngetich, [name], NP with [facility].  Insurance: [employer] Dual Complete  Patient was reviewed for less than 7 days readmission extreme high risk score with a 6 day length of stay for post hospital barriers to care at [facility].  Patient was screened for hospitalization and on behalf of [facility]  Care Coordination to assess for post hospital community care needs.  Patient is being considered for a skilled nursing facility level of care for post hospital transition for ST-rehab.  If the patient goes to a [facility] affiliated facility then, patient can be followed by [facility] RN with traditional Medicare and approved Medicare Advantage plans.    Plan:  If transitions to affiliated facility, then will notify the Community [facility] RN can follow for any known or needs for transitional care needs for returning to post facility care coordination needs to return to community.  For questions or referrals, please contact:  Richerd Fish, RN, BSN, CCM [facility] Liaison Direct Dial : [phone] or secure chat Email: [email or web address]

## 2023-02-10 NOTE — TOC Progression Note (Signed)
 Transition of Care [facility]) - Progression Note    Patient Details  Name: Lori [name] MRN: [id number] Date of Birth: [birth year]  [facility]) CM/SW Contact  Harlene Sharps, LCSW Phone Number: 02/10/2023, 2:26 PM  Clinical Narrative:     CSW spoke with pt's spouse who is at bedside with pt. They are agreeable to [facility] at DC. Per chart review, pt is not medically ready for DC at this time. Facility agreeable to accept pt at DC. TOC will continue to follow.       Expected Discharge Plan and Services                                               Social Determinants of Health (SDOH) Interventions SDOH Screenings   Food Insecurity: No Food Insecurity (02/04/2023)  Housing: Low Risk  (02/04/2023)  Transportation Needs: No Transportation Needs (02/04/2023)  Utilities: Not At Risk (02/04/2023)  Depression (PHQ2-9): Low Risk  (12/24/2022)  Recent Concern: Depression (PHQ2-9) - Medium Risk (10/28/2022)  Tobacco Use: Low Risk  (02/06/2023)    Readmission Risk Interventions    12/28/2022   11:30 AM 11/05/2022    1:38 PM 10/15/2022   10:25 AM  Readmission Risk Prevention Plan  Transportation Screening Complete Complete Complete  Medication Review Oceanographer) Complete Complete   PCP or Specialist appointment within 3-5 days of discharge Complete Complete Complete  HRI or Home Care Consult Complete Complete Complete  SW Recovery Care/Counseling Consult Complete Complete Complete  Palliative Care Screening Not Applicable Not Applicable Not Applicable  Skilled Nursing Facility Not Applicable Not Applicable Not Applicable

## 2023-02-10 NOTE — Plan of Care (Signed)
  Problem: Education: Goal: Ability to describe self-care measures that may prevent or decrease complications (Diabetes Survival Skills Education) will improve Outcome: Progressing Goal: Individualized Educational Video(s) Outcome: Progressing   Problem: Coping: Goal: Ability to adjust to condition or change in health will improve Outcome: Progressing   Problem: Fluid Volume: Goal: Ability to maintain a balanced intake and output will improve Outcome: Progressing   Problem: Health Behavior/Discharge Planning: Goal: Ability to identify and utilize available resources and services will improve Outcome: Progressing Goal: Ability to manage health-related needs will improve Outcome: Progressing   Problem: Metabolic: Goal: Ability to maintain appropriate glucose levels will improve Outcome: Progressing   Problem: Nutritional: Goal: Maintenance of adequate nutrition will improve Outcome: Progressing Goal: Progress toward achieving an optimal weight will improve Outcome: Progressing   Problem: Skin Integrity: Goal: Risk for impaired skin integrity will decrease Outcome: Progressing   Problem: Tissue Perfusion: Goal: Adequacy of tissue perfusion will improve Outcome: Progressing   Problem: Education: Goal: Knowledge of risk factors and measures for prevention of condition will improve Outcome: Progressing   Problem: Coping: Goal: Psychosocial and spiritual needs will be supported Outcome: Progressing   Problem: Respiratory: Goal: Will maintain a patent airway Outcome: Progressing Goal: Complications related to the disease process, condition or treatment will be avoided or minimized Outcome: Progressing   Problem: Education: Goal: Knowledge of General Education information will improve Description: Including pain rating scale, medication(s)/side effects and non-pharmacologic comfort measures Outcome: Progressing   Problem: Health Behavior/Discharge Planning: Goal:  Ability to manage health-related needs will improve Outcome: Progressing   Problem: Clinical Measurements: Goal: Ability to maintain clinical measurements within normal limits will improve Outcome: Progressing Goal: Will remain free from infection Outcome: Progressing Goal: Diagnostic test results will improve Outcome: Progressing Goal: Respiratory complications will improve Outcome: Progressing Goal: Cardiovascular complication will be avoided Outcome: Progressing   Problem: Activity: Goal: Risk for activity intolerance will decrease Outcome: Progressing   Problem: Nutrition: Goal: Adequate nutrition will be maintained Outcome: Progressing   Problem: Coping: Goal: Level of anxiety will decrease Outcome: Progressing   Problem: Elimination: Goal: Will not experience complications related to bowel motility Outcome: Progressing Goal: Will not experience complications related to urinary retention Outcome: Progressing   Problem: Pain Management: Goal: General experience of comfort will improve Outcome: Progressing   Problem: Safety: Goal: Ability to remain free from injury will improve Outcome: Progressing   Problem: Skin Integrity: Goal: Risk for impaired skin integrity will decrease Outcome: Progressing

## 2023-02-10 NOTE — Progress Notes (Signed)
   02/10/23 2046  BiPAP/CPAP/SIPAP  Reason BIPAP/CPAP not in use Non-compliant (When asked patient shook head no)  BiPAP/CPAP /SiPAP Vitals  Pulse Rate 77  Resp 19  SpO2 98 %  Bilateral Breath Sounds Clear;Diminished  MEWS Score/Color  MEWS Score 1  MEWS Score Color Green

## 2023-02-11 DIAGNOSIS — F332 Major depressive disorder, recurrent severe without psychotic features: Secondary | ICD-10-CM

## 2023-02-11 DIAGNOSIS — I5032 Chronic diastolic (congestive) heart failure: Secondary | ICD-10-CM | POA: Diagnosis not present

## 2023-02-11 DIAGNOSIS — E1143 Type 2 diabetes mellitus with diabetic autonomic (poly)neuropathy: Secondary | ICD-10-CM | POA: Diagnosis not present

## 2023-02-11 DIAGNOSIS — K920 Hematemesis: Secondary | ICD-10-CM | POA: Diagnosis not present

## 2023-02-11 DIAGNOSIS — U071 COVID-19: Secondary | ICD-10-CM | POA: Diagnosis not present

## 2023-02-11 LAB — CBC
HCT: 32.4 % — ABNORMAL LOW (ref 36.0–46.0)
Hemoglobin: 10.6 g/dL — ABNORMAL LOW (ref 12.0–15.0)
MCH: 25.2 pg — ABNORMAL LOW (ref 26.0–34.0)
MCHC: 32.7 g/dL (ref 30.0–36.0)
MCV: 77.1 fL — ABNORMAL LOW (ref 80.0–100.0)
Platelets: 384 10*3/uL (ref 150–400)
RBC: 4.2 MIL/uL (ref 3.87–5.11)
RDW: 18.6 % — ABNORMAL HIGH (ref 11.5–15.5)
WBC: 6.8 10*3/uL (ref 4.0–10.5)
nRBC: 0 % (ref 0.0–0.2)

## 2023-02-11 LAB — BASIC METABOLIC PANEL
Anion gap: 10 (ref 5–15)
BUN: 9 mg/dL (ref 6–20)
CO2: 22 mmol/L (ref 22–32)
Calcium: 8.9 mg/dL (ref 8.9–10.3)
Chloride: 106 mmol/L (ref 98–111)
Creatinine, Ser: 0.8 mg/dL (ref 0.44–1.00)
GFR, Estimated: >60 mL/min (ref >60)
Glucose, Bld: 104 mg/dL — ABNORMAL HIGH (ref 70–99)
Potassium: 4.1 mmol/L (ref 3.5–5.1)
Sodium: 138 mmol/L (ref 135–145)

## 2023-02-11 LAB — GLUCOSE, CAPILLARY
Glucose-Capillary: 107 mg/dL — ABNORMAL HIGH (ref 70–99)
Glucose-Capillary: 137 mg/dL — ABNORMAL HIGH (ref 70–99)
Glucose-Capillary: 172 mg/dL — ABNORMAL HIGH (ref 70–99)
Glucose-Capillary: 120 mg/dL — ABNORMAL HIGH (ref 70–99)

## 2023-02-11 LAB — MAGNESIUM: Magnesium: 2 mg/dL (ref 1.7–2.4)

## 2023-02-11 MED ORDER — ALBUTEROL SULFATE (2.5 MG/3ML) 0.083% IN NEBU: 2.5000 mg | INHALATION_SOLUTION | RESPIRATORY_TRACT | Status: DC | PRN

## 2023-02-11 MED ORDER — ALBUMIN HUMAN 25 % IV SOLN
25.0000 g | Freq: Two times a day (BID) | INTRAVENOUS | Status: AC
  Administered 2023-02-11 – 2023-02-12 (×2): 25 g via INTRAVENOUS
  Filled 2023-02-11 (×2): qty 100

## 2023-02-11 MED ORDER — HYDROXYZINE HCL 25 MG PO TABS
25.0000 mg | ORAL_TABLET | Freq: Three times a day (TID) | ORAL | Status: DC | PRN
  Administered 2023-02-11: 25 mg via ORAL
  Filled 2023-02-11: qty 1

## 2023-02-11 MED ORDER — OXYMETAZOLINE HCL 0.05 % NA SOLN: 1.0000 | Freq: Two times a day (BID) | NASAL | Status: DC | PRN

## 2023-02-11 MED ORDER — INSULIN ASPART 100 UNIT/ML IJ SOLN
0.0000 [IU] | Freq: Three times a day (TID) | INTRAMUSCULAR | Status: DC
  Administered 2023-02-11 – 2023-02-12 (×2): 1 [IU] via SUBCUTANEOUS
  Administered 2023-02-12 – 2023-02-13 (×2): 2 [IU] via SUBCUTANEOUS

## 2023-02-11 NOTE — TOC Progression Note (Addendum)
 Transition of Care [facility]) - Progression Note    Patient Details  Name: Lori [name] MRN: [id number] Date of Birth: Mar 08, 1970  Transition of Care [facility]) CM/SW Contact  Harlene Sharps, LCSW Phone Number: 02/11/2023, 10:39 AM  Clinical Narrative:    CSW advised that pt was progressing towards being medically ready. CSW spoke with [facility] and advised pt tested positive for covid 7 days ago. CSW requesting their policy on admission after positive test prior to starting authorization. TOC will continue to follow.   11:00 [facility] notes they can take pt on day 8, which will be tomorrow. Auth started and pending. TOC will follow.       Expected Discharge Plan and Services                                               Social Determinants of Health (SDOH) Interventions SDOH Screenings   Food Insecurity: No Food Insecurity (02/04/2023)  Housing: Low Risk  (02/04/2023)  Transportation Needs: No Transportation Needs (02/04/2023)  Utilities: Not At Risk (02/04/2023)  Depression (PHQ2-9): Low Risk  (12/24/2022)  Recent Concern: Depression (PHQ2-9) - Medium Risk (10/28/2022)  Tobacco Use: Low Risk  (02/06/2023)    Readmission Risk Interventions    12/28/2022   11:30 AM 11/05/2022    1:38 PM 10/15/2022   10:25 AM  Readmission Risk Prevention Plan  Transportation Screening Complete Complete Complete  Medication Review Oceanographer) Complete Complete   PCP or Specialist appointment within 3-5 days of discharge Complete Complete Complete  HRI or Home Care Consult Complete Complete Complete  SW Recovery Care/Counseling Consult Complete Complete Complete  Palliative Care Screening Not Applicable Not Applicable Not Applicable  Skilled Nursing Facility Not Applicable Not Applicable Not Applicable

## 2023-02-11 NOTE — Inpatient Diabetes Management (Signed)
 Inpatient Diabetes Program Recommendations  AACE/ADA: New Consensus Statement on Inpatient Glycemic Control (2015)  Target Ranges:  Prepandial:   less than 140 mg/dL      Peak postprandial:   less than 180 mg/dL (1-2 hours)      Critically ill patients:  140 - 180 mg/dL   Lab Results  Component Value Date   GLUCAP 107 (H) 02/11/2023   HGBA1C 8.5 (H) 11/24/2022    Latest Reference Range & Units 02/10/23 08:43 02/10/23 12:12 02/10/23 17:00 02/10/23 17:16 02/10/23 19:46 02/11/23 07:44  Glucose-Capillary 70 - 99 mg/dL 881 (H) 748 (H) 60 (L) 80 138 (H) 107 (H)  (H): Data is abnormally high (L): Data is abnormally low  Diabetes history: DM2 Outpatient Diabetes medications: Lantus  22 units,Novolog  correction scale Current orders for Inpatient glycemic control: Semglee  10 units, Novolog  0-15 units tid  Inpatient Diabetes Program Recommendations:   Noted hypoglycemia post correction Please consider: -Decrease Novolog  correction to 0-6 units tid, 0-5 units hs  Thank you, Lori E. Lyriq Jarchow, RN, MSN, CDCES  Diabetes Coordinator Inpatient Glycemic Control Team Team Pager [phone] (8am-5pm) 02/11/2023 10:49 AM

## 2023-02-11 NOTE — Progress Notes (Signed)
 PROGRESS NOTE    Lori [name]  FMW:[id number] DOB: [birth year] DOA: 02/04/2023 PCP: Leonarda [name], NP    Chief Complaint  Patient presents with   Emesis    Vomitting brown emesis since 10 pm, constipated for 3 days.    Brief Narrative:  Patient 53 year old unfortunate female history of diabetes type 2 with gastroparesis, CAD status post CABG, chronic diastolic heart failure, OSA on CPAP, history of CVA residual left-sided weakness currently bedbound, history of DVT on Xarelto , hypothyroidism, multiple recent hospitalizations last one 01/30/2023 for transient acute encephalopathy left facial droop, aphasia with unremarkable workup with discontinuation of amlodipine  to avoid hypotension presented to the ED with coffee-ground emesis.  Patient seen in consultation for GI and patient for upper endoscopy.  Patient also noted to have incidental COVID and started on Paxlovid .   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  Melanotic stools/acute erosive esophagitis -Patient presented with a 1 day history of coffee-ground emesis/hematemesis noted to have a bout of hematemesis in the ED per ED physician. -Patient noted to be chronically on Xarelto  and Plavix . -Patient also does state has had a month of black stools per her aide. -Patient denies any prior history of peptic ulcer disease, no history of NSAID use. -Hemoglobin noted to drop as low as 8.3 from 10.9 on admission on 02/06/2023, during EGD patient noted to have significant epistaxis and transfused a unit of PRBCs with hemoglobin currently stable at 10.6.SABRA -Patient received IV fluid boluses in the ED and placed on IV fluids due to dehydration and tachycardia. -Patient received Protonix  80 mg IV push x 1 on presentation to the ED, was on IV PPI twice daily and subsequently transitioned to oral PPI twice daily.  -Patient seen in consultation by GI underwent upper endoscopy with findings of erosive esophagitis likely source of bleed. -GI recommending PPI twice daily x 6 weeks and then daily thereafter in addition to continuation of Reglan . -Xarelto  and Plavix  held and resumed on 02/10/2023 after finishing Paxlovid . -Will follow H/H -Transfusion threshold hemoglobin < 8. -Will need outpatient follow-up with GI for repeat EGD.   2.  Dehydration -Status post IV fluids.   3.  Hyperthyroidism -Continue home regimen Tapazole  and metoprolol .   4.  Sinus tachycardia -Likely secondary to history of hyperthyroidism patient likely has not received her beta-blocker on day of admission. -Heart rate improved with resumption of home regimen Lopressor .   -supportive care.  5.  Incidental COVID-19 -Patient  noted to have a positive SARS coronavirus COVID-19 PCR on admission. -Patient with complaints of congestion cough x 4 days prior to admission. -CRP elevated but trended down during the hospitalization.  -Patient currently not hypoxic  with sats of 100% on room air. -s/p completion of 5-day course of Paxlovid .  -Continue scheduled Flonase , Claritin , albuterol  as needed. -Symptomatic treatment.    6.  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 -Plavix  and Xarelto  were held earlier on during the hospitalization due to concern for interactions with Paxlovid .   -Patient is status post completion of Paxlovid  02/08/2023.   -Plavix  and Xarelto  resumed after washout and patient with no noted further coffee-ground emesis.   -Continue to hold statin and resume on discharge.   -PT/OT/SLP.    7.  Diabetes mellitus type 2 with gastroparesis -Hemoglobin A1c 8.5 (11/24/2022) -CBG noted at 107 this morning. -Post correction patient noted to have a blood glucose of 60 at 5 PM last night. -Continue decreased home dose of long-acting Semglee  10 units daily,  -Change SSI to sensitive scale.   -IV Reglan  has been changed to oral Reglan .  -Follow.   8.  GERD -PPI.   9.  MDD -Continue Lexapro    10.  CAD/status post CABG -Stable. -BP soft this morning, hold Ranexa . -Place holding parameters on beta-blocker.   11.  Hyperlipidemia -Patient's statin was held while on Paxlovid  and has completed course of Paxlovid .   -Resume statin on discharge.   12.  OSA on CPAP -Due to presentation with nausea and coffee-ground emesis CPAP initially held but subsequently resumed nightly.    13.  Hypomagnesemia -Status post IV magnesium .   -Magnesium  at 2.0.  14.  Metabolic acidosis -Patient with bicarb trending back up currently at 22 from 19 from 20 from 20 from 15 from 19 from 21. -Patient was on bicarb drip which was discontinued last night.   -Follow.  15.  Morbid obesity -BMI 32.92 kg/m -Lifestyle modification -Outpatient follow-up with PCP.  16.  Nosebleed -Seen by ENT. -No masses seen on MRI as well as CT maxillofacial as was originally recommended by ENT and reviewed. -Patient with some  episodic nasal bleeding overnight which has since resolved.  -ENT has signed off.  -Place on Afrin as needed.     DVT prophylaxis: SCDs Code Status: Full Family Communication: Updated patient.  No family at bedside. Disposition: SNF when medically stable hopefully in the next 24 hours.  Status is: Observation The patient will require care spanning > 2 midnights and should be moved to inpatient because: Severity of illness   Consultants:  GI: Dr. Leigh 02/04/2023 ENT: Dr. Roark 02/05/2022  Procedures:  CT abdomen pelvis 02/04/2023 Chest x-ray 02/04/2023 CT maxillofacial 02/07/2023 Transfusion 1 unit PRBCs on 02/06/2023 Upper endoscopy per Dr. Leigh 02/06/2023  Antimicrobials:  Anti-infectives (From admission, onward)    Start     Dose/Rate Route Frequency Ordered Stop   02/05/23 1500  nirmatrelvir /ritonavir  (PAXLOVID ) 3 tablet        3 tablet Oral 2 times daily 02/05/23 0743 02/08/23 2359         Subjective: Patient sitting up in bed overall states she feels well.  Did endorse some wheezing earlier on this morning which she states has since improved.  Had some nosebleeds last night which have resolved.  Denies any further coffee-ground emesis.  Blood pressure noted to be soft and per RN patient refused metoprolol  this morning.  Patient in agreement to go to SNF  at [facility].    Objective: Vitals:   02/10/23 2046 02/11/23 0308 02/11/23 0309 02/11/23 0756  BP:  104/71  98/62  Pulse: 77 75  95  Resp: 19 19  16   Temp:  97.9 F (36.6 C)  98.3 F (36.8 C)  TempSrc:    Tympanic  SpO2: 98% 98%  98%  Weight:   85.4 kg   Height:        Intake/Output Summary (Last 24 hours) at 02/11/2023 1109 Last data filed at 02/11/2023 0400 Gross per 24 hour  Intake 0 ml  Output --  Net 0 ml    Filed Weights   02/09/23 0437 02/10/23 0445 02/11/23 0309  Weight: 85.5 kg 83.3 kg 85.4 kg    Examination:  General exam: NAD Respiratory system: Some decreased breath sounds in  the bases otherwise clear.  No wheezing, no crackles, no rhonchi.  Fair air movement.  Speaking in full sentences.  Cardiovascular system: RRR no murmurs rubs or gallops.  No JVD.  No pitting lower extremity edema.  Gastrointestinal system: Abdomen is soft, nontender, nondistended, positive bowel sounds.  No rebound.  No guarding.  Central nervous system: Alert and oriented.  Left upper extremity and contractures.  Some dysarthric speech.  5/5 right upper extremity and right lower extremity strength.  3-4/5 left lower extremity strength.  2-3/5 left upper extremity strength.  Extremities: Symmetric 5 x 5 power. Skin: No rashes, lesions or ulcers Psychiatry: Judgement and insight appear normal. Mood & affect appropriate.     Data Reviewed: I have personally reviewed following labs and imaging studies  CBC: Recent Labs  Lab 02/05/23 0744 02/06/23 0757 02/08/23 0435 02/09/23 0608 02/10/23 0529 02/11/23 0654  WBC 6.5 5.4 8.6 7.1 7.1 6.8  NEUTROABS 3.4 2.8  --   --   --   --   HGB 9.0* 8.3* 10.7* 10.7* 10.9* 10.6*  HCT 28.7* 26.9* 32.7* 32.1* 33.1* 32.4*  MCV 76.7* 77.5* 77.5* 75.9* 77.3* 77.1*  PLT 278 294 306 368 366 384    Basic Metabolic Panel: Recent Labs  Lab 02/06/23 0757 02/08/23 0435 02/09/23 1211 02/10/23 0528 02/10/23 0529 02/11/23 0654  NA 140 140 138  --  140 138  K 3.8 4.1 4.0  --  3.8 4.1  CL 116* 112* 107  --  110 106  CO2 15* 20* 20*  --  19* 22  GLUCOSE 86 119* 147*  --  97 104*  BUN 7 9 9   --  8 9  CREATININE 0.77 1.06* 0.85  --  0.72 0.80  CALCIUM  7.8* 8.6* 8.3*  --  8.8* 8.9  MG 1.6*  --   --  2.0  --  2.0    GFR: Estimated Creatinine Clearance: 85.3 mL/min (by C-G formula based on SCr of 0.8 mg/dL).  Liver Function Tests: Recent Labs  Lab 02/08/23 0435 02/09/23 1211 02/10/23 0529  AST 20 17 15   ALT 14 15 16   ALKPHOS 51 49 55  BILITOT 0.8 0.4 0.5  PROT 6.4* 6.6 6.5  ALBUMIN  3.0* 2.8* 2.6*    CBG: Recent Labs  Lab 02/10/23 1212  02/10/23 1700 02/10/23 1716 02/10/23 1946 02/11/23 0744  GLUCAP 251* 60* 80 138* 107*     Recent Results (from the past 240 hours)  Resp panel by RT-PCR (RSV, Flu A&B, Covid) Anterior Nasal Swab     Status: Abnormal   Collection Time: 02/04/23 10:55 PM   Specimen: Anterior Nasal Swab  Result Value Ref Range Status  SARS Coronavirus 2 by RT PCR POSITIVE (A) NEGATIVE Final   Influenza A by PCR NEGATIVE NEGATIVE Final   Influenza B by PCR NEGATIVE NEGATIVE Final    Comment: (NOTE) The Xpert Xpress SARS-CoV-2/FLU/RSV plus assay is intended as an aid in the diagnosis of influenza from Nasopharyngeal swab specimens and should not be used as a sole basis for treatment. Nasal washings and aspirates are unacceptable for Xpert Xpress SARS-CoV-2/FLU/RSV testing.  Fact Sheet for Patients: bloggercourse.com  Fact Sheet for Healthcare Providers: seriousbroker.it  This test is not yet approved or cleared by the [place]  FDA and has been authorized for detection and/or diagnosis of SARS-CoV-2 by FDA under an Emergency Use Authorization (EUA). This EUA will remain in effect (meaning this test can be used) for the duration of the COVID-19 declaration under Section 564(b)(1) of the Act, 21 U.S.C. section 360bbb-3(b)(1), unless the authorization is terminated or revoked.     Resp Syncytial Virus by PCR NEGATIVE NEGATIVE Final    Comment: (NOTE) Fact Sheet for Patients: bloggercourse.com  Fact Sheet for Healthcare Providers: seriousbroker.it  This test is not yet approved or cleared by the [place]  FDA and has been authorized for detection and/or diagnosis of SARS-CoV-2 by FDA under an Emergency Use Authorization (EUA). This EUA will remain in effect (meaning this test can be used) for the duration of the COVID-19 declaration under Section 564(b)(1) of the Act, 21 U.S.C. section  360bbb-3(b)(1), unless the authorization is terminated or revoked.  Performed at [facility], [address] [place], [place] [address]          Radiology Studies: No results found.       Scheduled Meds:  baclofen   10 mg Oral TID   clopidogrel   75 mg Oral Daily   escitalopram   20 mg Oral q morning   ezetimibe   10 mg Oral Daily   fluticasone   2 spray Each Nare Daily   folic acid   1 mg Oral Daily   gabapentin   100 mg Oral TID   insulin  aspart  0-6 Units Subcutaneous TID WC   insulin  glargine-yfgn  10 Units Subcutaneous QHS   loratadine   10 mg Oral Daily   LORazepam   0.5 mg Oral QHS   methimazole   5 mg Oral q morning   metoCLOPramide   5 mg Oral TID AC   metoprolol  tartrate  100 mg Oral BID   multivitamin with minerals  1 tablet Oral Daily   pantoprazole   40 mg Oral BID   polyethylene glycol  17 g Oral Daily   rivaroxaban   10 mg Oral Daily   sodium chloride  flush  3 mL Intravenous Q12H   tamsulosin   0.4 mg Oral QHS   topiramate   25 mg Oral BID   Continuous Infusions:  magnesium  sulfate bolus IVPB       LOS: 6 days    Time spent: 40 minutes    Toribio Hummer, MD [facility]   To contact the attending provider between 7A-7P or the covering provider during after hours 7P-7A, please log into the web site [email or web address] and access using universal [place] password for that web site. If you do not have the password, please call the hospital operator.  02/11/2023, 11:09 AM

## 2023-02-12 DIAGNOSIS — E1143 Type 2 diabetes mellitus with diabetic autonomic (poly)neuropathy: Secondary | ICD-10-CM | POA: Diagnosis not present

## 2023-02-12 DIAGNOSIS — I2510 Atherosclerotic heart disease of native coronary artery without angina pectoris: Secondary | ICD-10-CM | POA: Diagnosis not present

## 2023-02-12 DIAGNOSIS — K920 Hematemesis: Secondary | ICD-10-CM | POA: Diagnosis not present

## 2023-02-12 DIAGNOSIS — I5032 Chronic diastolic (congestive) heart failure: Secondary | ICD-10-CM | POA: Diagnosis not present

## 2023-02-12 DIAGNOSIS — G8194 Hemiplegia, unspecified affecting left nondominant side: Secondary | ICD-10-CM

## 2023-02-12 LAB — BASIC METABOLIC PANEL
Anion gap: 11 (ref 5–15)
BUN: 12 mg/dL (ref 6–20)
CO2: 22 mmol/L (ref 22–32)
Calcium: 9.3 mg/dL (ref 8.9–10.3)
Chloride: 104 mmol/L (ref 98–111)
Creatinine, Ser: 0.88 mg/dL (ref 0.44–1.00)
GFR, Estimated: >60 mL/min (ref >60)
Glucose, Bld: 220 mg/dL — ABNORMAL HIGH (ref 70–99)
Potassium: 3.8 mmol/L (ref 3.5–5.1)
Sodium: 137 mmol/L (ref 135–145)

## 2023-02-12 LAB — GLUCOSE, CAPILLARY
Glucose-Capillary: 206 mg/dL — ABNORMAL HIGH (ref 70–99)
Glucose-Capillary: 171 mg/dL — ABNORMAL HIGH (ref 70–99)
Glucose-Capillary: 135 mg/dL — ABNORMAL HIGH (ref 70–99)
Glucose-Capillary: 273 mg/dL — ABNORMAL HIGH (ref 70–99)

## 2023-02-12 LAB — CBC
HCT: 31.9 % — ABNORMAL LOW (ref 36.0–46.0)
Hemoglobin: 10.5 g/dL — ABNORMAL LOW (ref 12.0–15.0)
MCH: 25.4 pg — ABNORMAL LOW (ref 26.0–34.0)
MCHC: 32.9 g/dL (ref 30.0–36.0)
MCV: 77.2 fL — ABNORMAL LOW (ref 80.0–100.0)
Platelets: 419 10*3/uL — ABNORMAL HIGH (ref 150–400)
RBC: 4.13 MIL/uL (ref 3.87–5.11)
RDW: 18.7 % — ABNORMAL HIGH (ref 11.5–15.5)
WBC: 6 10*3/uL (ref 4.0–10.5)
nRBC: 0 % (ref 0.0–0.2)

## 2023-02-12 MED ORDER — LANTUS SOLOSTAR 100 UNIT/ML ~~LOC~~ SOPN: 10.0000 [IU] | PEN_INJECTOR | Freq: Every day | SUBCUTANEOUS | Status: DC

## 2023-02-12 MED ORDER — PANTOPRAZOLE SODIUM 40 MG PO TBEC: DELAYED_RELEASE_TABLET | ORAL | Status: DC

## 2023-02-12 MED ORDER — SENNOSIDES-DOCUSATE SODIUM 8.6-50 MG PO TABS
1.0000 | ORAL_TABLET | Freq: Two times a day (BID) | ORAL | Status: DC
  Administered 2023-02-12 – 2023-02-13 (×3): 1 via ORAL
  Filled 2023-02-12 (×3): qty 1

## 2023-02-12 MED ORDER — METOPROLOL TARTRATE 100 MG PO TABS: 100.0000 mg | ORAL_TABLET | Freq: Two times a day (BID) | ORAL | Status: DC

## 2023-02-12 MED ORDER — SORBITOL 70 % SOLN: 30.0000 mL | Freq: Every day | Status: DC | PRN

## 2023-02-12 MED ORDER — GUAIFENESIN 100 MG/5ML PO LIQD
5.0000 mL | ORAL | Status: DC | PRN
  Administered 2023-02-12: 5 mL via ORAL
  Filled 2023-02-12: qty 5

## 2023-02-12 MED ORDER — ACETAMINOPHEN 325 MG PO TABS: 650.0000 mg | ORAL_TABLET | Freq: Four times a day (QID) | ORAL | Status: AC | PRN

## 2023-02-12 MED ORDER — SORBITOL 70 % SOLN
30.0000 mL | Status: AC
  Administered 2023-02-12: 30 mL via ORAL
  Filled 2023-02-12 (×2): qty 30

## 2023-02-12 MED ORDER — FLUTICASONE PROPIONATE 50 MCG/ACT NA SUSP: 2.0000 | Freq: Every day | NASAL | Status: DC

## 2023-02-12 MED ORDER — OXYMETAZOLINE HCL 0.05 % NA SOLN: 1.0000 | Freq: Two times a day (BID) | NASAL | 0 refills | Status: DC | PRN

## 2023-02-12 MED ORDER — LORATADINE 10 MG PO TABS: 10.0000 mg | ORAL_TABLET | Freq: Every day | ORAL | 0 refills | Status: DC

## 2023-02-12 MED ORDER — SENNOSIDES-DOCUSATE SODIUM 8.6-50 MG PO TABS: 1.0000 | ORAL_TABLET | Freq: Two times a day (BID) | ORAL | Status: DC

## 2023-02-12 MED ORDER — LORAZEPAM 0.5 MG PO TABS: 0.5000 mg | ORAL_TABLET | Freq: Every day | ORAL | 0 refills | Status: DC

## 2023-02-12 NOTE — Discharge Summary (Addendum)
 Physician Discharge Summary  Lori [name] [name] FMW:[id number] DOB: [birth year] DOA: 02/04/2023  PCP: Leonarda [name], NP  Admit date: 02/04/2023 Discharge date: 02/12/2023  Time spent: 60 minutes  Recommendations for Outpatient Follow-up:  Follow-up with MD at skilled nursing facility.  Patient will need a basic metabolic profile and magnesium  level done in 1 week to follow-up on electrolytes and renal function.  Patient will need a CBC done to follow-up on counts. Follow-up with Dr. Leigh, gastroenterology in 6 to 8 weeks. Ambulatory referral to pulmonary made for evaluation of OSA.   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  Diet recommendation: Dysphagia 3 diet with thin liquids.  Filed Weights   02/10/23 0445 02/11/23 0309 02/12/23 0500  Weight: 83.3 kg 85.4 kg 84.9 kg    History of present illness:  Lori [name] is a 54 y.o. female with medical  history significant of type 2 diabetes mellitus with gastroparesis, CAD status post CABG in [place] , chronic diastolic heart failure, OSA on CPAP, history of CVA with residual left-sided weakness and currently bedbound, history of DVT on Xarelto , pheochromocytoma status post right adrenalectomy, hypothyroidism with multiple recent hospitalizations (admitted 12/26 -12/27/ 2024 for transient acute encephalopathy/left facial droop/aphasia with unremarkable workup with neurology recommendations of discontinuing amlodipine  to avoid hypotension given intracranial stenosis, admission 01/22/2023 -01/23/2023 for acute metabolic encephalopathy felt likely secondary to hyperglycemia and insomnia, acute metabolic encephalopathy, 11/28 -01/04/2023 for gastroparesis )presenting to the ED with 1 day history of nausea and vomiting/coffee-ground emesis with decreased oral intake.  Patient also does endorse 1 months of black stools per her aide, occasional dizziness and lightheadedness, nausea, ongoing heartburn.  Patient denies any fevers, no chills, no chest pain, no shortness of breath, no hematochezia.  Patient does endorse occasional spinning sensation.  Patient denies any prior history of peptic ulcer disease or NSAID use.   ED Course: Patient seen in the ED noted to have another bout of coffee-ground emesis/hematemesis per ED physician.  Patient noted with a sinus tachycardia.  Hemoglobin noted at 10.5 with repeat at 10.9.  Comprehensive metabolic profile with a sodium of 134, bicarb of 21, albumin  of 3.2 otherwise within normal limits.  Lipase level at 113.  Patient given a fluid bolus, PPI 80 mg IV x 1 push, GI consulted, hospitalist called for admission for further evaluation and management.  Hospital Course:  #1 hematemesis/coffee-ground emesis/?  Melanotic stools/acute erosive LA grade C esophagitis -Patient presented with a 1  day history of coffee-ground emesis/hematemesis noted to have a bout of hematemesis  in the ED per ED physician. -Patient noted to be chronically on Xarelto  and Plavix . -Patient also does state has had a month of black stools per her aide. -Patient denies any prior history of peptic ulcer disease, no history of NSAID use. -Hemoglobin noted to drop as low as 8.3 from 10.9 on admission on 02/06/2023, during EGD patient noted to have significant epistaxis and transfused a unit of PRBCs with hemoglobin stabilized at 10.5 by day of discharge.  -Patient received IV fluid boluses in the ED and placed on IV fluids due to dehydration and tachycardia. -Patient received Protonix  80 mg IV push x 1 on presentation to the ED, was on IV PPI twice daily and subsequently transitioned to oral PPI twice daily.  -Patient seen in consultation by GI underwent upper endoscopy with findings of erosive esophagitis/LA grade C esophagitis, single gastric polyp noted as well as single duodenal polyp noted and it was felt gastritis was likely source of bleed. -GI recommending PPI twice daily x 6 weeks and then daily thereafter in addition to continuation of Reglan . -Xarelto  and Plavix  held and resumed on 02/10/2023 after finishing Paxlovid . -Hemoglobin remained stable throughout the rest of the hospitalization and patient be discharged in stable and improved condition. -Will need outpatient follow-up with GI for repeat EGD.   2.  Dehydration -Status post IV fluids.   3.  Hyperthyroidism -Patient maintained on home regimen Tapazole  and metoprolol .   4.  Sinus tachycardia -Likely secondary to history of hyperthyroidism patient likely had not received her beta-blocker on day of admission. -Heart rate improved with resumption of home regimen Lopressor .   -Outpatient follow-up.   5.  Incidental COVID-19 -Patient noted to have a positive SARS coronavirus COVID-19 PCR on admission. -Patient with complaints of congestion cough x 4 days prior to admission. -CRP elevated but trended down during the hospitalization.   -Patient currently not hypoxic with sats of 100% on room air. -s/p completion of 5-day course of Paxlovid .  -Patient also maintained on scheduled Flonase , Claritin , albuterol  as needed. -Symptomatic treatment. -Patient improved clinically and be discharged in stable and improved condition.     6.  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 -Plavix  and Xarelto  were held earlier on during the hospitalization due to concern for interactions with Paxlovid .   -Patient status post completion of Paxlovid  02/08/2023.   -Plavix  and Xarelto  resumed after washout and patient with no noted further coffee-ground emesis or bleeding noted.   -Statin was held during the hospitalization will be resumed on discharge.  -Patient seen by PT/OT/SLP and SNF recommended.   -Patient will be discharged to SNF.   7.  Diabetes mellitus type 2 with gastroparesis -Hemoglobin A1c 8.5 (11/24/2022) -CBG noted at 107 this morning. -Post correction patient noted to have a blood glucose of 60 at 5 PM the evening of 02/10/2023.   -Patient maintained on a decreased dose of long-acting Semglee  at 10 units daily as well as sliding scale insulin .   -Patient initially placed on IV Reglan  and subsequently transition to home dose oral Reglan .   -Patient was discharged on decreased dose of long-acting insulin  at 10 units daily as well as SSI.   8.  GERD -PPI.   9.  MDD -Patient maintained on home regimen Lexapro .    10.  CAD/status post CABG -Stable. -BP soft during the hospitalization as such Ranexa  held and holding parameters placed on  beta-blocker.     11.  Hyperlipidemia -Patient's statin was held while on Paxlovid  and has completed course of Paxlovid .   -Statin will be resumed on discharge.   12.  OSA on CPAP -Due to presentation with nausea and coffee-ground emesis CPAP initially held but subsequently resumed nightly. -Patient noted not to have a CPAP at home per [facility], and  ambulatory referral made to pulmonary for outpatient evaluation of OSA and further recommendations.   13.  Hypomagnesemia -Repleted during the hospitalization.    14.  Metabolic acidosis -Resolved with bicarb drip.    15.  Morbid obesity -BMI 32.92 kg/m -Lifestyle modification -Outpatient follow-up with PCP.   16.  Nosebleed -Seen by ENT. -No masses seen on MRI as well as CT maxillofacial as was originally recommended by ENT and reviewed. -Patient with some episodic nasal bleeding which has since resolved.   -ENT has signed off.  -Placed on Afrin nasal spray as needed.  17.  Constipation -On day of discharge patient with complaints of constipation stating MiraLAX  made her stomach hurt. -Patient placed on sorbitol  30 cc every 3 hours x 2 doses as well as Senokot-S twice daily. -Patient will be discharged on Senokot-S twice daily as well as sorbitol  as needed.      Procedures: CT abdomen pelvis 02/04/2023 Chest x-ray 02/04/2023 CT maxillofacial 02/07/2023 Transfusion 1 unit PRBCs on 02/06/2023 Upper endoscopy per Dr. Leigh 02/06/2023  Consultations: GI: Dr. Leigh 02/04/2023 ENT: Dr. Roark 02/05/2022  Discharge Exam: Vitals:   02/11/23 2204 02/12/23 0624  BP: (!) 101/59 99/70  Pulse: 95 80  Resp: 18 18  Temp: 98.8 F (37.1 C) 98.1 F (36.7 C)  SpO2: 100% 98%    General: NAD Cardiovascular: RRR no murmurs rubs or gallops.  No JVD.  No lower extremity edema. Respiratory: Clear to auscultation bilaterally anterior lung fields.  No wheezes, no crackles, no rhonchi.  Fair air movement.  Discharge Instructions   Discharge Instructions     Ambulatory referral to Pulmonology   Complete by: As directed    OSA. Needs sleep study done and further eval.   Reason for referral: Other   Diet - low sodium heart healthy   Complete by: As directed    Dysphagia 3 diet with thin liquids   Increase activity slowly   Complete by: As directed       Allergies as of  02/12/2023       Reactions   Dorethia Beady ] Anaphylaxis, Swelling, Other (See Comments)   Throat closes   Contrast Media [iodinated Contrast Media] Anaphylaxis, Swelling   Imitrex [sumatriptan] Anaphylaxis   Ms Contin  [morphine ] Anaphylaxis, Swelling   Penicillins Swelling, Other (See Comments)   Mouth and eyes swell   Firvanq  [vancomycin ] Other (See Comments)   Red Man's Syndrome   Cipro  [ciprofloxacin  Hcl] Nausea And Vomiting   Nitrofuran Derivatives Anxiety   Wound Dressing Adhesive Itching, Rash        Medication List     TAKE these medications    acetaminophen  325 MG tablet Commonly known as: TYLENOL  Take 2 tablets (650 mg total) by mouth every 6 (six) hours as needed for mild pain (pain score 1-3) (or Fever >/= 101).   albuterol  108 (90 Base) MCG/ACT inhaler Commonly known as: VENTOLIN  HFA INHALE 2 PUFFS BY MOUTH EVERY 6 HOURS AS NEEDED FOR WHEEZING AND FOR SHORTNESS OF BREATH Strength: 108 (90 Base) MCG/ACT What changed:  how much to take how to take this when to take this reasons to  take this additional instructions   albuterol  (2.5 MG/3ML) 0.083% nebulizer solution Commonly known as: PROVENTIL  Take 3 mLs (2.5 mg total) by nebulization every 6 (six) hours as needed for wheezing or shortness of breath. What changed: Another medication with the same name was changed. Make sure you understand how and when to take each.   atorvastatin  80 MG tablet Commonly known as: LIPITOR  Take 80 mg by mouth daily.   baclofen  10 MG tablet Commonly known as: LIORESAL  Take 1 tablet (10 mg total) by mouth 3 (three) times daily.   clopidogrel  75 MG tablet Commonly known as: PLAVIX  TAKE 1 TABLET (75 MG TOTAL) BY MOUTH DAILY.   cyclobenzaprine  5 MG tablet Commonly known as: FLEXERIL  TAKE 1 TABLET BY MOUTH 3 TIMES DAILY AS NEEDED FOR MUSCLE SPASMS What changed: when to take this   escitalopram  20 MG tablet Commonly known as: LEXAPRO  Take 1 tablet (20 mg total) by mouth every  morning.   ezetimibe  10 MG tablet Commonly known as: ZETIA  TAKE 1 TABLET (10 MG TOTAL) BY MOUTH DAILY.   fluticasone  50 MCG/ACT nasal spray Commonly known as: FLONASE  Place 2 sprays into both nostrils daily for 7 days.   FreeStyle Libre 14 Day Sensor Misc 1 each by Does not apply route as needed. DX: E11.69   FreeStyle Libre 2 Reader Devi 1 Device by Does not apply route daily. E11.69   gabapentin  100 MG capsule Commonly known as: NEURONTIN  Take 100 mg by mouth 3 (three) times daily.   insulin  lispro 100 UNIT/ML injection Commonly known as: HUMALOG  Sliding scale AC, HS CBG 121 - 150: 2 units  CBG 151 - 200: 3 units  CBG 201 - 250: 5 units  CBG 251 - 300: 8 units  CBG 301 - 350: 11 units  CBG 351 - 400: 15 units  CBG > 400: call MD   Lantus  SoloStar 100 UNIT/ML Solostar Pen Generic drug: insulin  glargine Inject 10 Units into the skin at bedtime. What changed: how much to take   loratadine  10 MG tablet Commonly known as: CLARITIN  Take 1 tablet (10 mg total) by mouth daily.   LORazepam  0.5 MG tablet Commonly known as: ATIVAN  Take 1 tablet (0.5 mg total) by mouth at bedtime.   methimazole  5 MG tablet Commonly known as: TAPAZOLE  Take 1 tablet (5 mg total) by mouth every morning.   metoCLOPramide  5 MG tablet Commonly known as: REGLAN  Take 1 tablet (5 mg total) by mouth 3 (three) times daily before meals.   metoprolol  tartrate 100 MG tablet Commonly known as: LOPRESSOR  Take 1 tablet (100 mg total) by mouth 2 (two) times daily. Hold for SBP < 110 What changed: additional instructions   oxymetazoline  0.05 % nasal spray Commonly known as: AFRIN Place 1 spray into both nostrils 2 (two) times daily as needed for congestion (nasal bleeding).   pantoprazole  40 MG tablet Commonly known as: PROTONIX  Take 1 tablet (40 mg total) by mouth 2 (two) times daily for 42 days, THEN 1 tablet (40 mg total) daily. Start taking on: February 12, 2023 What changed: See the new  instructions.   psyllium 58.6 % powder Commonly known as: METAMUCIL Take 1 packet by mouth daily.   ranolazine  500 MG 12 hr tablet Commonly known as: RANEXA  Take 1 tablet (500 mg total) by mouth 2 (two) times daily.   rivaroxaban  10 MG Tabs tablet Commonly known as: XARELTO  Take 1 tablet (10 mg total) by mouth daily.   senna-docusate 8.6-50 MG tablet Commonly known  as: Senokot-S Take 1 tablet by mouth 2 (two) times daily.   sorbitol  70 % Soln Take 30 mLs by mouth daily as needed for moderate constipation.   tamsulosin  0.4 MG Caps capsule Commonly known as: FLOMAX  Take 0.4 mg by mouth at bedtime.   topiramate  25 MG tablet Commonly known as: TOPAMAX  Take 1 tablet (25 mg total) by mouth 2 (two) times daily.   Voltaren  1 % Gel Generic drug: diclofenac  Sodium Apply 2 g topically 4 (four) times daily.   zinc  oxide 11.3 % Crea cream Commonly known as: BALMEX Apply 1 Application topically 2 (two) times daily.       Allergies  Allergen Reactions   Asa [Aspirin ] Anaphylaxis, Swelling and Other (See Comments)    Throat closes   Contrast Media [Iodinated Contrast Media] Anaphylaxis and Swelling   Imitrex [Sumatriptan] Anaphylaxis   Ms Contin  [Morphine ] Anaphylaxis and Swelling   Penicillins Swelling and Other (See Comments)    Mouth and eyes swell   Firvanq  [Vancomycin ] Other (See Comments)    Red Man's Syndrome   Cipro  [Ciprofloxacin  Hcl] Nausea And Vomiting   Nitrofuran Derivatives Anxiety   Wound Dressing Adhesive Itching and Rash    Contact information for follow-up providers     MD at SNF Follow up.          Armbruster, Elspeth SQUIBB, MD. Schedule an appointment as soon as possible for a visit in 8 week(s).   Specialty: Gastroenterology Why: Follow-up in 6 to 8 weeks. Contact information: [address] [place] [address] [phone]              Contact information for after-discharge care     Destination     [facility] Preferred SNF .   Service: Skilled Nursing Contact information: [address] [place]  [phone]                      The results of significant diagnostics from this hospitalization (including imaging, microbiology, ancillary and laboratory) are listed below for reference.    Significant Diagnostic Studies: CT MAXILLOFACIAL WO CONTRAST Result Date: 02/07/2023 CLINICAL DATA:  Question nasal foreign body. EXAM: CT MAXILLOFACIAL WITHOUT CONTRAST TECHNIQUE: Multidetector CT imaging of the maxillofacial structures was performed. Multiplanar CT image reconstructions were also generated. RADIATION DOSE REDUCTION: This exam was performed according to the departmental dose-optimization program which includes automated exposure control, adjustment of the mA and/or kV according to patient size and/or use of iterative reconstruction technique. COMPARISON:  CT of the head 01/30/2023. FINDINGS: Osseous: No fracture or mandibular dislocation. No destructive process. Mild nasal septal deviation the left. Orbits: Negative. No traumatic or inflammatory finding. Sinuses: There are small air-fluid levels in the sphenoid sinuses and right maxillary sinus. Soft tissues: No focal abnormality. Specifically, no radiopaque foreign body identified. Limited intracranial: No significant or unexpected finding. IMPRESSION: 1. No radiopaque foreign body identified. 2. Small air-fluid levels in the sphenoid sinuses and right maxillary sinus. Correlate for acute sinusitis. Electronically Signed   By: Greig Pique M.D.   On: 02/07/2023 16:39   CT ABDOMEN PELVIS WO CONTRAST Result Date: 02/04/2023 CLINICAL DATA:  Abdominal pain, acute (Ped 0-17y) pancreatitis?/complications//SBO?. EXAM: CT ABDOMEN AND PELVIS WITHOUT CONTRAST TECHNIQUE: Multidetector CT imaging of the abdomen and pelvis was performed following the standard protocol without IV contrast. RADIATION  DOSE REDUCTION: This exam was performed according to the departmental dose-optimization program which includes automated exposure control, adjustment of the mA  and/or kV according to patient size and/or use of iterative reconstruction technique. COMPARISON:  CT scan abdomen and pelvis from 11/22/2022. FINDINGS: Lower chest: There is small right pleural effusion which is new since the prior study. No left pleural effusion. The visualized lungs are otherwise clear. The heart is normal in size. No pericardial effusion. There are coronary artery atherosclerotic calcifications, in keeping with coronary artery disease. Hepatobiliary: The liver is normal in size. Non-cirrhotic configuration. No suspicious mass. No intrahepatic or extrahepatic bile duct dilation. Gallbladder is surgically absent. Pancreas: Mild atrophy of the pancreas. There are several dystrophic calcifications throughout the pancreas, nonspecific but commonly seen as a sequela of chronic pancreatitis. No suspicious pancreatic mass seen within the limitations of this unenhanced exam. No peripancreatic fat stranding. Spleen: Within normal limits. No focal lesion. Adrenals/Urinary Tract: Unremarkable left adrenal gland. Right adrenal gland is not well visualized and may have been surgically removed. Correlate with surgical history. No suspicious renal mass within the limitations of this unenhanced exam. No nephroureterolithiasis or obstructive uropathy. Unremarkable urinary bladder. Stomach/Bowel: Redemonstration of a diverticulum arising from the third part of duodenum. No disproportionate dilation of the small or large bowel loops. No evidence of abnormal bowel wall thickening or inflammatory changes. The appendix was not visualized; however there is no acute inflammatory process in the right lower quadrant. There are multiple diverticula throughout the colon, without imaging signs of diverticulitis. Vascular/Lymphatic: No ascites or pneumoperitoneum. No  abdominal or pelvic lymphadenopathy, by size criteria. No aneurysmal dilation of the major abdominal arteries. There are marked peripheral atherosclerotic vascular calcifications of the aorta and its major branches. Reproductive: The uterus is surgically absent. No large adnexal mass. Other: There is periumbilical surgical scar in small fat containing hernia. There is also irregular hyperattenuating soft tissue along the lower midline anterior abdominal wall, similar to the prior study, likely from prior surgery related fibrosis/scarring. No abnormal collection seen. Musculoskeletal: No suspicious osseous lesions. There are mild multilevel degenerative changes in the visualized spine. IMPRESSION: 1. No acute inflammatory process identified within the abdomen or pelvis. No imaging evidence of acute pancreatitis. No bowel obstruction. 2. New small right pleural effusion. 3. Multiple other nonacute observations, as described above. Aortic Atherosclerosis (ICD10-I70.0). Electronically Signed   By: Ree Molt M.D.   On: 02/04/2023 14:46   DG Chest 1 View Result Date: 02/04/2023 CLINICAL DATA:  Cough and emesis EXAM: CHEST  1 VIEW COMPARISON:  01/31/2023 FINDINGS: Stable cardiomediastinal silhouette. Aortic atherosclerotic calcification. Sternotomy and CABG. No focal consolidation, pleural effusion, or pneumothorax. No displaced rib fractures. IMPRESSION: No active disease. Electronically Signed   By: Norman Gatlin M.D.   On: 02/04/2023 08:56   EEG adult Result Date: 01/31/2023 Shelton Arlin KIDD, MD     01/31/2023  3:56 PM Patient Name: Fina [facility]izer [facility] [name] MRN: [id number] Epilepsy Attending: Arlin KIDD Shelton Referring Physician/Provider: Lonzell Emeline HERO, DO Date: 01/31/2023 Duration: 24.25 mins Patient history: 53yo F with ams getting eeg to evaluate for seizure Level of alertness: Awake AEDs during EEG study: None Technical aspects: This EEG study was done with scalp electrodes positioned  according to the 10-20 International system of electrode placement. Electrical activity was reviewed with band pass filter of 1-70Hz , sensitivity of 7 uV/mm, display speed of 62mm/sec with a 60Hz  notched filter applied as appropriate. EEG data were recorded continuously and digitally stored.  Video monitoring was available and reviewed as appropriate. Description: The posterior dominant rhythm consists of 7.5 Hz activity of moderate voltage (25-35 uV) seen  predominantly in posterior head regions, symmetric and reactive to eye opening and eye closing. EEG showed continuous generalized 5-7hz  theta slowing. There is also intermittent 2-3Hz  delta slowing in left frontal region. Hyperventilation and photic stimulation were not performed.   ABNORMALITY - Intermittent slow, right frontal region - Continuous slow, generalized IMPRESSION: This study is suggestive of cortical dysfunction arising from right frontal region likely secondary to underlying structural abnormality. Additionally there is mild to moderate diffuse encephalopathy. No seizures or epileptiform discharges were seen throughout the recording. Priyanka O Yadav   VAS US  CAROTID Result Date: 01/31/2023 Carotid Arterial Duplex Study Patient Name:  KINYA MEINE [facility]  Date of Exam:   01/31/2023 Medical Rec #: [id number]                            Accession #:    [id number] Date of Birth: 10/24/1970                             Patient Gender: F Patient Age:   23 years Exam Location:  [facility] Procedure:      VAS US  CAROTID Referring Phys: EMELINE IDOL --------------------------------------------------------------------------------  Indications:       CVA. Risk Factors:      Hypertension, Diabetes. Limitations        Today's exam was limited due to the patient's respiratory                    variation and patient movement. Comparison Study:  02/15/2022 - Right Carotid: Velocities in the right ICA are                    consistent with  a 1-39%                    stenosis.                    Non-hemodynamically significant plaque <50% noted in the                    CCA.                     Left Carotid: Velocities in the left ICA are consistent with                    a 1-39%                    stenosis.                    Non-hemodynamically significant plaque <50% noted in the                    CCA.                     Vertebrals: Bilateral vertebral arteries demonstrate                    antegrade flow.                    Subclavians: Normal flow hemodynamics were seen in bilateral                    subclavian  arteries. Performing Technologist: Cordella Collet RVT  Examination Guidelines: A complete evaluation includes B-mode imaging, spectral Doppler, color Doppler, and power Doppler as needed of all accessible portions of each vessel. Bilateral testing is considered an integral part of a complete examination. Limited examinations for reoccurring indications may be performed as noted.  Right Carotid Findings: +----------+--------+--------+--------+-----------------------+--------+           PSV cm/sEDV cm/sStenosisPlaque Description     Comments +----------+--------+--------+--------+-----------------------+--------+ CCA Prox  64      22              smooth and heterogenous         +----------+--------+--------+--------+-----------------------+--------+ CCA Distal73      25              smooth and heterogenous         +----------+--------+--------+--------+-----------------------+--------+ ICA Prox  88      22              smooth and heterogenous         +----------+--------+--------+--------+-----------------------+--------+ ICA Mid   42      20                                              +----------+--------+--------+--------+-----------------------+--------+ ICA Distal50      24                                               +----------+--------+--------+--------+-----------------------+--------+ ECA       81      15                                              +----------+--------+--------+--------+-----------------------+--------+ +----------+--------+-------+--------+-------------------+           PSV cm/sEDV cmsDescribeArm Pressure (mmHG) +----------+--------+-------+--------+-------------------+ [id number]                                        +----------+--------+-------+--------+-------------------+ +---------+--------+--+--------+-+---------+ VertebralPSV cm/s23EDV cm/s8Antegrade +---------+--------+--+--------+-+---------+  Left Carotid Findings: +----------+--------+--------+--------+-----------------------+--------+           PSV cm/sEDV cm/sStenosisPlaque Description     Comments +----------+--------+--------+--------+-----------------------+--------+ CCA Prox  87      33              smooth and heterogenous         +----------+--------+--------+--------+-----------------------+--------+ CCA Distal101     40              smooth and heterogenous         +----------+--------+--------+--------+-----------------------+--------+ ICA Prox  56      20              smooth and heterogenous         +----------+--------+--------+--------+-----------------------+--------+ ICA Mid   54      24                                              +----------+--------+--------+--------+-----------------------+--------+ ICA  Distal56      29                                              +----------+--------+--------+--------+-----------------------+--------+ ECA       195     41                                              +----------+--------+--------+--------+-----------------------+--------+ +----------+--------+--------+--------+-------------------+           PSV cm/sEDV cm/sDescribeArm Pressure (mmHG) +----------+--------+--------+--------+-------------------+  [id number]                                         +----------+--------+--------+--------+-------------------+ +---------+--------+--+--------+-+--------------+ VertebralPSV cm/s49EDV cm/s6High resistant +---------+--------+--+--------+-+--------------+   Summary: Right Carotid: Velocities in the right ICA are consistent with a 1-39% stenosis. Left Carotid: Velocities in the left ICA are consistent with a 1-39% stenosis. Vertebrals: Right vertebral artery demonstrates antegrade flow. Left vertebral             artery demonstrates high resistant flow. *See table(s) above for measurements and observations.  Electronically signed by Gaile New MD on 01/31/2023 at 11:20:45 AM.    Final    DG CHEST PORT 1 VIEW Result Date: 01/31/2023 CLINICAL DATA:  Acute encephalopathy, altered mental status, chest pain EXAM: PORTABLE CHEST 1 VIEW COMPARISON:  01/22/2023 FINDINGS: Prior CABG. Heart and mediastinal contours are within normal limits. No focal opacities or effusions. No acute bony abnormality. IMPRESSION: No active disease. Electronically Signed   By: Franky Crease M.D.   On: 01/31/2023 00:53   MR ANGIO HEAD WO CONTRAST Result Date: 01/30/2023 CLINICAL DATA:  Stroke suspected EXAM: MRI HEAD WITHOUT CONTRAST MRA HEAD WITHOUT CONTRAST TECHNIQUE: Multiplanar, multi-echo pulse sequences of the brain and surrounding structures were acquired without intravenous contrast. Angiographic images of the Circle of Willis were acquired using MRA technique without intravenous contrast. COMPARISON:  01/20/2023 MRI head, no prior MRA available FINDINGS: MRI HEAD FINDINGS Brain: No restricted diffusion to suggest acute or subacute infarct. No acute hemorrhage, mass, mass effect, or midline shift. No hydrocephalus or extra-axial collection. Pituitary and craniocervical junction within normal limits. No hemosiderin deposition to suggest remote hemorrhage. Encephalomalacia left cerebellum and left frontal lobe. Remote  lacunar infarcts in the basal ganglia, thalami, and pons. Confluent T2 hyperintense signal in the periventricular white matter, likely the sequela of moderate to severe chronic small vessel ischemic disease. Vascular: Normal arterial flow voids. Skull and upper cervical spine: Normal marrow signal. Sinuses/Orbits: Clear paranasal sinuses. No acute finding in the orbits. Other: The mastoid air cells are well aerated. MRA HEAD FINDINGS Evaluation is somewhat limited by motion. Anterior circulation: Both internal carotid arteries are patent to the termini, with moderate to severe stenosis in the bilateral cavernous segments (series 9, image 106) and likely moderate stenosis in the bilateral supraclinoid segment (series 9, image 121). Patent left A1. Aplastic or stenosed right A1. Normal anterior communicating artery. Anterior cerebral arteries are irregular but grossly patent to their distal aspects without significant stenosis. Mild stenosis in the right M1 (series 9, image 130). No significant stenosis in the left M1. Distal MCA branches are irregular but perfused to their distal  aspects without significant stenosis. Posterior circulation: Poor signal in the vertebral arteries, which may be artifactual or reflect stenosis or occlusion. Intermittent irregular signal in the basilar artery, likely multifocal stenosis. The vertebral arteries patent to the vertebrobasilar junction without stenosis. Posterior inferior cerebral arteries patent bilaterally. Basilar patent to its distal aspect. Superior cerebellar arteries patent proximally. Aplastic or hypoplastic P1 segments, with fetal origin of the right and near fetal origin of the left PCA from the posterior communicating arteries, which appear irregular with at least mild multifocal stenosis. PCAs are irregular with at least mild multifocal stenosis, but appear perfused to their distal aspects. Anatomic variants: Fetal or near fetal origin of the PCA. IMPRESSION: 1. No  acute intracranial process. No evidence of acute or subacute infarct. 2. Evaluation of the vasculature is somewhat limited by motion. 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 This could be further evaluated with a CTA of the head if clinically indicated. 3. Mild stenosis in the right M1. 4. Poor signal in the vertebral arteries, which may be artifactual or reflect stenosis or occlusion. Intermittent irregular signal in the basilar artery, likely multifocal stenosis. 5. Irregularity of the anterior and posterior circulation, which may be due to atherosclerosis and/or motion. Electronically Signed   By: Donald Campion M.D.   On: 01/30/2023 22:36   MR BRAIN WO CONTRAST Result Date: 01/30/2023 CLINICAL DATA:  Stroke suspected EXAM: MRI HEAD WITHOUT CONTRAST MRA HEAD WITHOUT CONTRAST TECHNIQUE: Multiplanar, multi-echo pulse sequences of the brain and surrounding structures were acquired without intravenous contrast. Angiographic images of the Circle of Willis were acquired using MRA technique without intravenous contrast. COMPARISON:  01/20/2023 MRI head, no prior MRA available FINDINGS: MRI HEAD FINDINGS Brain: No restricted diffusion to suggest acute or subacute infarct. No acute hemorrhage, mass, mass effect, or midline shift. No hydrocephalus or extra-axial collection. Pituitary and craniocervical junction within normal limits. No hemosiderin deposition to suggest remote hemorrhage. Encephalomalacia left cerebellum and left frontal lobe. Remote lacunar infarcts in the basal ganglia, thalami, and pons. Confluent T2 hyperintense signal in the periventricular white matter, likely the sequela of moderate to severe chronic small vessel ischemic disease. Vascular: Normal arterial flow voids. Skull and upper cervical spine: Normal marrow signal. Sinuses/Orbits:  Clear paranasal sinuses. No acute finding in the orbits. Other: The mastoid air cells are well aerated. MRA HEAD FINDINGS Evaluation is somewhat limited by motion. Anterior circulation: Both internal carotid arteries are patent to the termini, with moderate to severe stenosis in the bilateral cavernous segments (series 9, image 106) and likely moderate stenosis in the bilateral supraclinoid segment (series 9, image 121). Patent left A1. Aplastic or stenosed right A1. Normal anterior communicating artery. Anterior cerebral arteries are irregular but grossly patent to their distal aspects without significant stenosis. Mild stenosis in the right M1 (series 9, image 130). No significant stenosis in the left M1. Distal MCA branches are irregular but perfused to their distal aspects without significant stenosis. Posterior circulation: Poor signal in the vertebral arteries, which may be artifactual or reflect stenosis or occlusion. Intermittent irregular signal in the basilar artery, likely multifocal stenosis. The vertebral arteries patent to the vertebrobasilar junction without stenosis. Posterior inferior cerebral arteries patent bilaterally. Basilar patent to its distal aspect. Superior cerebellar arteries patent proximally. Aplastic or hypoplastic P1 segments, with fetal origin of the right and near fetal origin of the left PCA from  the posterior communicating arteries, which appear irregular with at least mild multifocal stenosis. PCAs are irregular with at least mild multifocal stenosis, but appear perfused to their distal aspects. Anatomic variants: Fetal or near fetal origin of the PCA. IMPRESSION: 1. No acute intracranial process. No evidence of acute or subacute infarct. 2. Evaluation of the vasculature is somewhat limited by motion. 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 This could be further evaluated with a CTA of the head if clinically indicated. 3. Mild stenosis in the right M1. 4. Poor signal in the vertebral arteries, which may be artifactual or reflect stenosis or occlusion. Intermittent irregular signal in the basilar artery, likely multifocal stenosis. 5. Irregularity of the anterior and posterior circulation, which may be due to atherosclerosis and/or motion. Electronically Signed   By: Donald Campion M.D.   On: 01/30/2023 22:36   CT Head Wo Contrast Result Date: 01/30/2023 CLINICAL DATA:  Neuro deficit, acute, stroke suspected EXAM: CT HEAD WITHOUT CONTRAST TECHNIQUE: Contiguous axial images were obtained from the base of the skull through the vertex without intravenous contrast. RADIATION DOSE REDUCTION: This exam was performed according to the departmental dose-optimization program which includes automated exposure control, adjustment of the mA and/or kV according to patient size and/or use of iterative reconstruction technique. COMPARISON:  Head CT 01/22/2023 FINDINGS: Brain: Chronic infarcts in the left cerebellum, left frontal lobe, bilateral basal ganglia. There is a background of mild chronic microvascular ischemic change. No hemorrhage. No hydrocephalus. No CT evidence of an acute cortical infarct. No mass effect. No lesion. Vascular: No hyperdense vessel or unexpected calcification. Vascular stents in the carotid siphons bilaterally. Skull: Normal. Negative for fracture or focal lesion. Sinuses/Orbits: No middle ear or mastoid effusion. Paranasal sinuses are clear. Orbits are unremarkable. Other: None. IMPRESSION: 1. No hemorrhage or CT evidence of an acute cortical infarct. 2. 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 Electronically Signed   By: Lyndall Gore M.D.   On: 01/30/2023 14:03   DG Chest 2 View Result Date: 01/22/2023 CLINICAL DATA:  Altered mental status. Restricted left arm  movement. EXAM: CHEST - 2 VIEW COMPARISON:  01/19/2023 FINDINGS: The lateral view is limited by overlapping of the patient's arm. Normal sized heart. Clear lungs with normal vascularity. Median sternotomy wires. Mildly tortuous and partially calcified thoracic aorta. Unremarkable bones. IMPRESSION: No acute abnormality. Electronically Signed   By: Elspeth Bathe M.D.   On: 01/22/2023 17:25   CT Head Wo Contrast Result Date: 01/22/2023 CLINICAL DATA:  Mental status change, unknown cause EXAM: CT HEAD WITHOUT CONTRAST TECHNIQUE: Contiguous axial images were obtained from the base of the skull through the vertex without intravenous contrast. RADIATION DOSE REDUCTION: This exam was performed according to the departmental dose-optimization program which includes automated exposure control, adjustment of the mA and/or kV according to patient size and/or use of iterative reconstruction technique. COMPARISON:  CT head 01/20/2023. FINDINGS: Brain: No evidence of acute infarction, hemorrhage, hydrocephalus, extra-axial collection or mass lesion/mass effect. Remote left cerebellar infarct. Remote basal ganglia lacunar infarcts. Remote left frontal cortical infarct. Patchy white matter hypodensities are nonspecific but compatible with chronic microvascular disease. Vascular: No hyperdense vessel. Skull: No acute fracture. Sinuses/Orbits: Clear sinuses.  No acute orbital findings. Other: No mastoid effusions. IMPRESSION: 1. No evidence of acute intracranial abnormality. 2. 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  Electronically Signed   By: Gilmore GORMAN Molt M.D.   On: 01/22/2023 16:44   MR BRAIN WO CONTRAST Result Date: 01/20/2023 CLINICAL DATA:  Neuro deficit, acute, stroke suspected. Right-sided numbness and slurred speech. EXAM: MRI HEAD WITHOUT CONTRAST TECHNIQUE: Multiplanar, multiecho pulse sequences of the brain and surrounding structures were obtained without intravenous contrast. COMPARISON:   Head CT 01/20/2023 and MRI 11/04/2022 FINDINGS: A complete MRI could not be obtained due to the patient's condition. Axial and coronal diffusion, sagittal T1, axial and coronal T2, and axial FLAIR sequences were obtained and are motion degraded, including severe motion on the coronal T2 sequence. Brain: Diffusion-weighted imaging is of good quality, and there is no evidence of an acute infarct. Advanced chronic small-vessel ischemic disease is again noted in the cerebral white matter with chronic infarcts in the deep white matter and deep gray nuclei bilaterally. A small chronic left frontal cortical infarct and moderate-sized chronic left cerebellar infarcts are also again noted. There is wallerian degeneration involving the brainstem on the right. There is mild cerebral atrophy. Lack of susceptibility weighted or gradient echo imaging limits assessment for intracranial hemorrhage. There is no midline shift or extra-axial fluid collection. Vascular: Absent flow void in the distal left vertebral artery, unchanged from the prior MRI and which may reflect chronic occlusion or slow flow. Skull and upper cervical spine: Unremarkable bone marrow signal. Sinuses/Orbits: Grossly unchanged 1 cm FLAIR hyperintense nodule at the anterosuperior aspect of the left orbit as described on the prior MRI. Clear sinuses. Other: None. IMPRESSION: 1. Motion degraded, incomplete examination. 2. No acute infarct. 3. Advanced chronic ischemia with multiple old infarcts. Electronically Signed   By: Dasie Hamburg M.D.   On: 01/20/2023 09:33   CT HEAD CODE STROKE WO CONTRAST Addendum Date: 01/20/2023 ADDENDUM REPORT: 01/20/2023 08:47 ADDENDUM: Study briefly discussed by telephone with Dr. FREDIA SKEETER on 01/20/2023 at 0840 hours. And these results were communicated to Dr. Jerrie at 8:47 am on 01/20/2023 by text page via the [facility] messaging system. Electronically Signed   By: VEAR Hurst M.D.   On: 01/20/2023 08:47   Result Date:  01/20/2023 CLINICAL DATA:  Code stroke. 53 year old female right side numbness, abnormal speech. Last seen well 0720 hours. EXAM: CT HEAD WITHOUT CONTRAST TECHNIQUE: Contiguous axial images were obtained from the base of the skull through the vertex without intravenous contrast. RADIATION DOSE REDUCTION: This exam was performed according to the departmental dose-optimization program which includes automated exposure control, adjustment of the mA and/or kV according to patient size and/or use of iterative reconstruction technique. COMPARISON:  Brain MRI 11/03/2022.  Head CT yesterday. FINDINGS: Brain: Advanced chronic ischemic disease. Chronic encephalomalacia in the left cerebellum PICA territory, bilateral deep gray nuclei, bilateral corona radiata with some involvement of the body of the corpus callosum, left MCA anterior division. Stable cerebral volume. No midline shift, mass effect, or evidence of intracranial mass lesion. No ventriculomegaly. No acute intracranial hemorrhage identified. No cortically based acute infarct identified. Vascular: Extensive Calcified atherosclerosis at the skull base. No suspicious intracranial vascular hyperdensity. Skull: Stable and intact. Sinuses/Orbits: Visualized paranasal sinuses and mastoids are clear. Other: No gaze deviation, acute orbit or scalp soft tissue finding. ASPECTS [facility] Stroke Program Early CT Score) Total score (0-10 with 10 being normal): 10, chronic encephalomalacia. IMPRESSION: Stable non contrast CT appearance of advanced chronic ischemic disease. No acute cortically based infarct or acute intracranial hemorrhage identified. ASPECTS 10. Electronically Signed: By: VEAR Hurst M.D. On: 01/20/2023 08:35   CT Head  Wo Contrast Result Date: 01/19/2023 CLINICAL DATA:  53 year old female with history of altered mental status. EXAM: CT HEAD WITHOUT CONTRAST TECHNIQUE: Contiguous axial images were obtained from the base of the skull through the vertex without  intravenous contrast. RADIATION DOSE REDUCTION: This exam was performed according to the departmental dose-optimization program which includes automated exposure control, adjustment of the mA and/or kV according to patient size and/or use of iterative reconstruction technique. COMPARISON:  Head CT 11/22/2022. FINDINGS: Brain: Mild cerebral and cerebellar atrophy. Patchy and confluent areas of decreased attenuation are noted throughout the deep and periventricular white matter of the cerebral hemispheres bilaterally, compatible with chronic microvascular ischemic disease. Multiple more well-defined areas of low attenuation in the basal ganglia bilaterally (right greater than left), similar to prior studies, compatible with old lacunar infarcts. There is also well-defined low-attenuation in the left frontal lobe and the left cerebellar hemisphere, compatible with areas of encephalomalacia from remote infarcts, similar to the prior study. No evidence of acute infarction, hemorrhage, hydrocephalus, extra-axial collection or mass lesion/mass effect. Vascular: No hyperdense vessel or unexpected calcification. Skull: Normal. Negative for fracture or focal lesion. Sinuses/Orbits: No acute finding. Other: None. IMPRESSION: 1. No acute intracranial abnormalities. 2. Mild cerebral and cerebellar atrophy with extensive chronic microvascular ischemic changes in the cerebral white matter, old basal ganglia lacunar infarcts bilaterally, and old left frontal and left cerebellar hemisphere infarcts, similar to prior examination. Electronically Signed   By: Toribio Aye M.D.   On: 01/19/2023 12:13   DG Chest Portable 1 View Result Date: 01/19/2023 CLINICAL DATA:  53 year old female with shortness of breath, hypoglycemia, lethargy. EXAM: PORTABLE CHEST 1 VIEW COMPARISON:  Portable chest 12/25/2022 and earlier. FINDINGS: Portable AP semi upright view at 1138 hours. Low lung volumes, similar to prior. Chronic sternotomy.  Mediastinal contours remain within normal limits. Visualized tracheal air column is within normal limits. Allowing for portable technique the lungs are clear. Evidence of right lung azygous vein and fissure (normal variant). Negative visible bowel gas. IMPRESSION: Low lung volumes, otherwise no acute cardiopulmonary abnormality. Electronically Signed   By: VEAR Hurst M.D.   On: 01/19/2023 12:09    Microbiology: Recent Results (from the past 240 hours)  Resp panel by RT-PCR (RSV, Flu A&B, Covid) Anterior Nasal Swab     Status: Abnormal   Collection Time: 02/04/23 10:55 PM   Specimen: Anterior Nasal Swab  Result Value Ref Range Status   SARS Coronavirus 2 by RT PCR POSITIVE (A) NEGATIVE Final   Influenza A by PCR NEGATIVE NEGATIVE Final   Influenza B by PCR NEGATIVE NEGATIVE Final    Comment: (NOTE) The Xpert Xpress SARS-CoV-2/FLU/RSV plus assay is intended as an aid in the diagnosis of influenza from Nasopharyngeal swab specimens and should not be used as a sole basis for treatment. Nasal washings and aspirates are unacceptable for Xpert Xpress SARS-CoV-2/FLU/RSV testing.  Fact Sheet for Patients: bloggercourse.com  Fact Sheet for Healthcare Providers: seriousbroker.it  This test is not yet approved or cleared by the [place]  FDA and has been authorized for detection and/or diagnosis of SARS-CoV-2 by FDA under an Emergency Use Authorization (EUA). This EUA will remain in effect (meaning this test can be used) for the duration of the COVID-19 declaration under Section 564(b)(1) of the Act, 21 U.S.C. section 360bbb-3(b)(1), unless the authorization is terminated or revoked.     Resp Syncytial Virus by PCR NEGATIVE NEGATIVE Final    Comment: (NOTE) Fact Sheet for Patients: bloggercourse.com  Fact Sheet for Healthcare Providers:  seriousbroker.it  This test is not yet approved or  cleared by the [place]  FDA and has been authorized for detection and/or diagnosis of SARS-CoV-2 by FDA under an Emergency Use Authorization (EUA). This EUA will remain in effect (meaning this test can be used) for the duration of the COVID-19 declaration under Section 564(b)(1) of the Act, 21 U.S.C. section 360bbb-3(b)(1), unless the authorization is terminated or revoked.  Performed at [facility], [address] [place], [place] [address]      Labs: Basic Metabolic Panel: Recent Labs  Lab 02/06/23 [id number] 02/08/23 0435 02/09/23 1211 02/10/23 0528 02/10/23 0529 02/11/23 0654 02/12/23 0703  NA 140 140 138  --  140 138 137  K 3.8 4.1 4.0  --  3.8 4.1 3.8  CL 116* 112* 107  --  110 106 104  CO2 15* 20* 20*  --  19* 22 22  GLUCOSE 86 119* 147*  --  97 104* 220*  BUN 7 9 9   --  8 9 12   CREATININE 0.77 1.06* 0.85  --  0.72 0.80 0.88  CALCIUM  7.8* 8.6* 8.3*  --  8.8* 8.9 9.3  MG 1.6*  --   --  2.0  --  2.0  --    Liver Function Tests: Recent Labs  Lab 02/08/23 0435 02/09/23 1211 02/10/23 0529  AST 20 17 15   ALT 14 15 16   ALKPHOS 51 49 55  BILITOT 0.8 0.4 0.5  PROT 6.4* 6.6 6.5  ALBUMIN  3.0* 2.8* 2.6*   No results for input(s): LIPASE, AMYLASE in the last 168 hours. No results for input(s): AMMONIA in the last 168 hours. CBC: Recent Labs  Lab 02/06/23 0757 02/08/23 0435 02/09/23 0608 02/10/23 0529 02/11/23 0654 02/12/23 0703  WBC 5.4 8.6 7.1 7.1 6.8 6.0  NEUTROABS 2.8  --   --   --   --   --   HGB 8.3* 10.7* 10.7* 10.9* 10.6* 10.5*  HCT 26.9* 32.7* 32.1* 33.1* 32.4* 31.9*  MCV 77.5* 77.5* 75.9* 77.3* 77.1* 77.2*  PLT 294 306 368 366 384 419*   Cardiac Enzymes: No results for input(s): CKTOTAL, CKMB, CKMBINDEX, TROPONINI in the last 168 hours. BNP: BNP (last 3 results) Recent Labs    04/30/22 1245 05/22/22 0229 09/06/22 2242  BNP 26.3 49.6 19.5    ProBNP (last 3 results) No results for input(s): PROBNP in the last 8760  hours.  CBG: Recent Labs  Lab 02/11/23 0744 02/11/23 1129 02/11/23 1738 02/11/23 2202 02/12/23 0854  GLUCAP 107* 137* 172* 120* 206*       Signed:  Toribio Hummer MD.  [facility] 02/12/2023, 11:36 AM

## 2023-02-12 NOTE — Progress Notes (Signed)
 AVS printed and placed in packet for [facility]

## 2023-02-12 NOTE — Progress Notes (Signed)
   02/12/23 2203  BiPAP/CPAP/SIPAP  $ Non-Invasive Ventilator  Non-Invasive Vent Initial  $ Face Mask Small Yes  BiPAP/CPAP/SIPAP Pt Type Adult  BiPAP/CPAP/SIPAP Resmed  Reason BIPAP/CPAP not in use Non-compliant (Placed pt on CPAP, pt ended up not tolerating CPAP. CPAP taken off pt.)  BiPAP/CPAP /SiPAP Vitals  SpO2 99 %  Bilateral Breath Sounds Clear;Diminished

## 2023-02-12 NOTE — Progress Notes (Signed)
 Occupational Therapy Treatment Patient Details Name: Lori [name] MRN: [id number] DOB: 05-Jul-1970 Today's Date: 02/12/2023   History of present illness 53 y.o. female presents to [facility] 02/04/23 with n/v with coffee ground emesis. S/p EGD on 1/2 w/ erosive esophagitis noted as cause of bleeding with 1 cm hiatal hernia. Awaiting pathology for biopsied polyp. Pt given 1 unit PRBC due to hypotension, low Hgb, and R nose bleed after EGD. Pt also with + Covid diagnosis. Multiple prior admits for acute encephalopathy and gastroparesis. 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  Mobility specialist assisted today. She requires max assist +2 for bed mobility, sitting EOB worked on grooming and dynamic balance as pt tends to lean to R and posteriorly with any task (even washing face) as easily distracted today.  Stood with max assist +2 at EOB and stepped towards HOB.  Continue to recommend <3hrs/day inpatient setting at dc.  Will follow acutely.       If plan is discharge home, recommend the following:  Two people to help with walking and/or transfers;A lot of help with bathing/dressing/bathroom;Assistance with cooking/housework;Assist for transportation;Help with stairs or ramp for entrance;Direct supervision/assist for medications management;Direct supervision/assist for financial management   Equipment Recommendations  Other (comment) (defer)    Recommendations for Other Services      Precautions / Restrictions Precautions Precautions: Fall Precaution Comments: L sided weakness from prior CVA, Covid+ Restrictions Weight Bearing Restrictions Per Provider Order: No       Mobility Bed Mobility Overal bed mobility: Needs Assistance Bed Mobility: Supine to Sit, Sit to  Supine     Supine to sit: Max assist, +2 for physical assistance Sit to supine: Max assist, +2 for physical assistance   General bed mobility comments: Decreased initiation by pt, cues for moving RLE towards edge of bed, assist with LLE and use of bed pad to pivot hips. Assist for BLE elevation back into bed. +2 to scoot up with bed in Trendelenberg    Transfers Overall transfer level: Needs assistance Equipment used: 2 person hand held assist Transfers: Sit to/from Stand Sit to Stand: Max assist, +2 physical assistance           General transfer comment: stood at EOB with hand held assist with +2 max assist     Balance Overall balance assessment: Needs assistance, Mild deficits observed, not formally tested Sitting-balance support: Feet supported Sitting balance-Leahy Scale: Poor Sitting balance - Comments: with ADL or distraction pt with LOB to R and posteriorly.  when focused able to reach outside base of support with min guard, tends to loose balance towards R but self corrects with hand on bed. Postural control: Posterior lean, Right lateral lean Standing balance support: Bilateral upper extremity supported Standing balance-Leahy Scale: Zero Standing balance comment: reliant on UE support and external support                           ADL either performed or assessed with clinical judgement   ADL Overall ADL's : Needs assistance/impaired     Grooming: Sitting;Minimal assistance           Upper Body Dressing : Moderate assistance;Sitting   Lower Body Dressing: Total assistance;+2 for physical assistance;Sit to/from stand   Toilet Transfer:  Maximal assistance;+2 for physical assistance Toilet Transfer Details (indicate cue type and reason): side stepping towards HOB         Functional mobility during ADLs: Maximal assistance;+2 for physical assistance      Extremity/Trunk Assessment              Vision       Perception     Praxis       Cognition Arousal: Alert Behavior During Therapy: Flat affect Overall Cognitive Status: No family/caregiver present to determine baseline cognitive functioning                                 General Comments: follows simple commands, decreased attention and dual tasking.        Exercises      Shoulder Instructions       General Comments      Pertinent Vitals/ Pain       Pain Assessment Pain Assessment: Faces Faces Pain Scale: Hurts little more Pain Location: LUE Pain Descriptors / Indicators: Guarding, Tender Pain Intervention(s): Limited activity within patient's tolerance, Monitored during session, Repositioned  Home Living                                          Prior Functioning/Environment              Frequency  Min 1X/week        Progress Toward Goals  OT Goals(current goals can now be found in the care plan section)  Progress towards OT goals: Progressing toward goals  Acute Rehab OT Goals Patient Stated Goal: get better OT Goal Formulation: With patient Time For Goal Achievement: 02/21/23 Potential to Achieve Goals: Good  Plan      Co-evaluation                 AM-PAC OT 6 Clicks Daily Activity     Outcome Measure   Help from another person eating meals?: A Little Help from another person taking care of personal grooming?: A Little Help from another person toileting, which includes using toliet, bedpan, or urinal?: Total Help from another person bathing (including washing, rinsing, drying)?: A Lot Help from another person to put on and taking off regular upper body clothing?: A Lot Help from another person to put on and taking off regular lower body clothing?: Total 6 Click Score: 12    End of Session Equipment Utilized During Treatment: Gait belt  OT Visit Diagnosis: Other abnormalities of gait and mobility (R26.89);Muscle weakness (generalized) (M62.81);Other symptoms and signs involving  the nervous system (R29.898)   Activity Tolerance Patient tolerated treatment well   Patient Left in bed;with call bell/phone within reach;with bed alarm set   Nurse Communication Mobility status;Other (comment) (pt asking for muscle relaxor and apple juice)        Time: 8890-8862 OT Time Calculation (min): 28 min  Charges: OT General Charges $OT Visit: 1 Visit OT Treatments $Self Care/Home Management : 23-37 mins  Etta NOVAK, OT Acute Rehabilitation Services Office [phone]   Etta GORMAN [place] 02/12/2023, 1:32 PM

## 2023-02-12 NOTE — TOC Progression Note (Signed)
 Transition of Care [facility]) - Progression Note    Patient Details  Name: Lori [name] MRN: [id number] Date of Birth: [birth year]  [facility] CM/SW Contact  Harlene Sharps, LCSW Phone Number: 02/12/2023, 2:30 PM  Clinical Narrative:     CSW was advised by facility that pt told them she has a CPAP at home and needs one at the facility. Per documentation, pt has refused CPAP in hospital and has not had a sleep study in over a year. Per respiratory they do not have settings and were going to start pt at 10 and titrate to comfort. Per Facility they cannot order CPAP without specific settings, they are agreeable to take pt without CPAP if MD signs off and pt referred for sleep study in the community.  This was agreed upon and all criteria met by medical team. Facility noted at 2 PM they now do not have a bed for pt today, and pt will need to admit tomorrow. Medical team notified to delay. TOC will continue to follow.        Expected Discharge Plan and Services         Expected Discharge Date: 02/12/23                                     Social Determinants of Health (SDOH) Interventions SDOH Screenings   Food Insecurity: No Food Insecurity (02/04/2023)  Housing: Low Risk  (02/04/2023)  Transportation Needs: No Transportation Needs (02/04/2023)  Utilities: Not At Risk (02/04/2023)  Depression (PHQ2-9): Low Risk  (12/24/2022)  Recent Concern: Depression (PHQ2-9) - Medium Risk (10/28/2022)  Tobacco Use: Low Risk  (02/06/2023)    Readmission Risk Interventions    12/28/2022   11:30 AM 11/05/2022    1:38 PM 10/15/2022   10:25 AM  Readmission Risk Prevention Plan  Transportation Screening Complete Complete Complete  Medication Review Oceanographer) Complete Complete   PCP or Specialist appointment within 3-5 days of discharge Complete Complete Complete  HRI or Home Care Consult Complete Complete Complete  SW Recovery Care/Counseling Consult  Complete Complete Complete  Palliative Care Screening Not Applicable Not Applicable Not Applicable  Skilled Nursing Facility Not Applicable Not Applicable Not Applicable

## 2023-02-12 NOTE — Plan of Care (Signed)

## 2023-02-13 DIAGNOSIS — K2091 Esophagitis, unspecified with bleeding: Secondary | ICD-10-CM | POA: Diagnosis not present

## 2023-02-13 DIAGNOSIS — F32A Depression, unspecified: Secondary | ICD-10-CM

## 2023-02-13 DIAGNOSIS — Z743 Need for continuous supervision: Secondary | ICD-10-CM | POA: Diagnosis not present

## 2023-02-13 DIAGNOSIS — I69398 Other sequelae of cerebral infarction: Secondary | ICD-10-CM | POA: Diagnosis not present

## 2023-02-13 DIAGNOSIS — I6930 Unspecified sequelae of cerebral infarction: Secondary | ICD-10-CM | POA: Diagnosis not present

## 2023-02-13 DIAGNOSIS — G8191 Hemiplegia, unspecified affecting right dominant side: Secondary | ICD-10-CM | POA: Diagnosis not present

## 2023-02-13 DIAGNOSIS — R4701 Aphasia: Secondary | ICD-10-CM | POA: Diagnosis not present

## 2023-02-13 DIAGNOSIS — J449 Chronic obstructive pulmonary disease, unspecified: Secondary | ICD-10-CM | POA: Diagnosis not present

## 2023-02-13 DIAGNOSIS — K219 Gastro-esophageal reflux disease without esophagitis: Secondary | ICD-10-CM | POA: Diagnosis not present

## 2023-02-13 DIAGNOSIS — J45909 Unspecified asthma, uncomplicated: Secondary | ICD-10-CM | POA: Diagnosis not present

## 2023-02-13 DIAGNOSIS — I10 Essential (primary) hypertension: Secondary | ICD-10-CM | POA: Diagnosis not present

## 2023-02-13 DIAGNOSIS — U071 COVID-19: Secondary | ICD-10-CM | POA: Diagnosis not present

## 2023-02-13 DIAGNOSIS — G9341 Metabolic encephalopathy: Secondary | ICD-10-CM | POA: Diagnosis not present

## 2023-02-13 DIAGNOSIS — I7389 Other specified peripheral vascular diseases: Secondary | ICD-10-CM | POA: Diagnosis not present

## 2023-02-13 DIAGNOSIS — E1169 Type 2 diabetes mellitus with other specified complication: Secondary | ICD-10-CM

## 2023-02-13 DIAGNOSIS — E612 Magnesium deficiency: Secondary | ICD-10-CM | POA: Diagnosis not present

## 2023-02-13 DIAGNOSIS — Z951 Presence of aortocoronary bypass graft: Secondary | ICD-10-CM | POA: Diagnosis not present

## 2023-02-13 DIAGNOSIS — K920 Hematemesis: Secondary | ICD-10-CM | POA: Diagnosis not present

## 2023-02-13 DIAGNOSIS — M6281 Muscle weakness (generalized): Secondary | ICD-10-CM | POA: Diagnosis not present

## 2023-02-13 DIAGNOSIS — I2510 Atherosclerotic heart disease of native coronary artery without angina pectoris: Secondary | ICD-10-CM | POA: Diagnosis not present

## 2023-02-13 DIAGNOSIS — E1100 Type 2 diabetes mellitus with hyperosmolarity without nonketotic hyperglycemic-hyperosmolar coma (NKHHC): Secondary | ICD-10-CM | POA: Diagnosis not present

## 2023-02-13 DIAGNOSIS — D649 Anemia, unspecified: Secondary | ICD-10-CM | POA: Diagnosis not present

## 2023-02-13 DIAGNOSIS — E7849 Other hyperlipidemia: Secondary | ICD-10-CM | POA: Diagnosis not present

## 2023-02-13 DIAGNOSIS — E119 Type 2 diabetes mellitus without complications: Secondary | ICD-10-CM | POA: Diagnosis not present

## 2023-02-13 DIAGNOSIS — E785 Hyperlipidemia, unspecified: Secondary | ICD-10-CM | POA: Diagnosis not present

## 2023-02-13 DIAGNOSIS — F419 Anxiety disorder, unspecified: Secondary | ICD-10-CM

## 2023-02-13 DIAGNOSIS — Z7401 Bed confinement status: Secondary | ICD-10-CM | POA: Diagnosis not present

## 2023-02-13 DIAGNOSIS — R1312 Dysphagia, oropharyngeal phase: Secondary | ICD-10-CM | POA: Diagnosis not present

## 2023-02-13 DIAGNOSIS — I5032 Chronic diastolic (congestive) heart failure: Secondary | ICD-10-CM | POA: Diagnosis not present

## 2023-02-13 DIAGNOSIS — E0590 Thyrotoxicosis, unspecified without thyrotoxic crisis or storm: Secondary | ICD-10-CM | POA: Diagnosis not present

## 2023-02-13 DIAGNOSIS — K922 Gastrointestinal hemorrhage, unspecified: Secondary | ICD-10-CM

## 2023-02-13 DIAGNOSIS — G4733 Obstructive sleep apnea (adult) (pediatric): Secondary | ICD-10-CM | POA: Diagnosis not present

## 2023-02-13 DIAGNOSIS — Z7901 Long term (current) use of anticoagulants: Secondary | ICD-10-CM | POA: Diagnosis not present

## 2023-02-13 DIAGNOSIS — R262 Difficulty in walking, not elsewhere classified: Secondary | ICD-10-CM | POA: Diagnosis not present

## 2023-02-13 DIAGNOSIS — R531 Weakness: Secondary | ICD-10-CM | POA: Diagnosis not present

## 2023-02-13 DIAGNOSIS — E1143 Type 2 diabetes mellitus with diabetic autonomic (poly)neuropathy: Secondary | ICD-10-CM | POA: Diagnosis not present

## 2023-02-13 DIAGNOSIS — K209 Esophagitis, unspecified without bleeding: Secondary | ICD-10-CM | POA: Diagnosis not present

## 2023-02-13 LAB — GLUCOSE, CAPILLARY: Glucose-Capillary: 220 mg/dL — ABNORMAL HIGH (ref 70–99)

## 2023-02-13 MED ORDER — INSULIN GLARGINE-YFGN 100 UNIT/ML ~~LOC~~ SOLN
14.0000 [IU] | Freq: Every day | SUBCUTANEOUS | Status: DC
  Filled 2023-02-13: qty 0.14

## 2023-02-13 MED ORDER — GUAIFENESIN 100 MG/5ML PO LIQD: 5.0000 mL | ORAL | 0 refills | Status: DC | PRN

## 2023-02-13 MED ORDER — LANTUS SOLOSTAR 100 UNIT/ML ~~LOC~~ SOPN: 14.0000 [IU] | PEN_INJECTOR | Freq: Every day | SUBCUTANEOUS | Status: DC

## 2023-02-13 NOTE — Care Management Important Message (Signed)
 Important Message  Patient Details  Name: Lori [name] MRN: [id number] Date of Birth: 20-Apr-1970   Important Message Given:  Yes - Medicare IM     Renie Ora 02/13/2023, 10:31 AM

## 2023-02-13 NOTE — Plan of Care (Signed)
  Problem: Coping: Goal: Ability to adjust to condition or change in health will improve Outcome: Progressing   Problem: Metabolic: Goal: Ability to maintain appropriate glucose levels will improve Outcome: Progressing   Problem: Nutritional: Goal: Maintenance of adequate nutrition will improve Outcome: Progressing   Problem: Skin Integrity: Goal: Risk for impaired skin integrity will decrease Outcome: Progressing   Problem: Respiratory: Goal: Will maintain a patent airway Outcome: Progressing   Problem: Clinical Measurements: Goal: Will remain free from infection Outcome: Progressing   Problem: Clinical Measurements: Goal: Respiratory complications will improve Outcome: Progressing

## 2023-02-13 NOTE — TOC Transition Note (Signed)
 Transition of Care [facility]) - Discharge Note   Patient Details  Name: Lori [name] MRN: [id number] Date of Birth: [birth year]  Transition of Care [facility]) CM/SW Contact:  Harlene Sharps, LCSW Phone Number: 02/13/2023, 10:28 AM   Clinical Narrative:    Pt to be transported to [facility] via [place]. Nurse to call report to [phone] Rm# [phone]   Final next level of care: Skilled Nursing Facility Barriers to Discharge: Barriers Resolved   Patient Goals and CMS Choice            Discharge Placement              Patient chooses bed at: [facility] Patient to be transferred to facility by: [facility] Name of family member notified: Attempted to contact spouse, unable to leave VM, will try again. Patient and family notified of of transfer: 02/13/23  Discharge Plan and Services Additional resources added to the After Visit Summary for                                       Social Drivers of Health (SDOH) Interventions SDOH Screenings   Food Insecurity: No Food Insecurity (02/04/2023)  Housing: Low Risk  (02/04/2023)  Transportation Needs: No Transportation Needs (02/04/2023)  Utilities: Not At Risk (02/04/2023)  Depression (PHQ2-9): Low Risk  (12/24/2022)  Recent Concern: Depression (PHQ2-9) - Medium Risk (10/28/2022)  Tobacco Use: Low Risk  (02/06/2023)     Readmission Risk Interventions    12/28/2022   11:30 AM 11/05/2022    1:38 PM 10/15/2022   10:25 AM  Readmission Risk Prevention Plan  Transportation Screening Complete Complete Complete  Medication Review Oceanographer) Complete Complete   PCP or Specialist appointment within 3-5 days of discharge Complete Complete Complete  HRI or Home Care Consult Complete Complete Complete  SW Recovery Care/Counseling Consult Complete Complete Complete  Palliative Care Screening Not Applicable Not Applicable Not Applicable  Skilled Nursing Facility Not Applicable Not Applicable  Not Applicable

## 2023-02-13 NOTE — Progress Notes (Signed)
 PROGRESS NOTE    Lori [name] [name]  FMW:[id number] DOB: [birth year] DOA: 02/04/2023 PCP: Leonarda [name], NP    Chief Complaint  Patient presents with   Emesis    Vomitting brown emesis since 10 pm, constipated for 3 days.    Brief Narrative:  Patient 53 year old unfortunate female history of diabetes type 2 with gastroparesis, CAD status post CABG, chronic diastolic heart failure, OSA on CPAP, history of CVA residual left-sided weakness currently bedbound, history of DVT on Xarelto , hypothyroidism, multiple recent hospitalizations last one 01/30/2023 for transient acute encephalopathy left facial droop, aphasia with unremarkable workup with discontinuation of amlodipine  to avoid hypotension presented to the ED with coffee-ground emesis.  Patient seen in consultation for GI and patient for upper endoscopy.  Patient also noted to have incidental COVID and started on Paxlovid .   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  Melanotic stools/acute erosive esophagitis -Patient presented with a 1 day history of coffee-ground emesis/hematemesis noted to have a bout of hematemesis in the ED per ED physician. -Patient noted to be chronically on Xarelto  and Plavix . -Patient also does state has had a month of black stools per her aide. -Patient denies any prior history of peptic ulcer disease, no history of NSAID use. -Hemoglobin noted to drop as low as 8.3 from 10.9 on admission on 02/06/2023, during EGD patient noted to have significant epistaxis and transfused a unit of PRBCs with hemoglobin currently stable at 10.6.SABRA -Patient received IV fluid boluses in the ED and placed on IV fluids due to dehydration and tachycardia. -Patient received Protonix  80 mg IV push x 1 on presentation to the ED, was on IV PPI twice daily and subsequently transitioned to oral PPI twice daily.  -Patient seen in consultation by GI underwent upper endoscopy with findings of erosive esophagitis likely source of bleed. -GI recommending PPI twice daily x 6 weeks and then daily thereafter in addition to continuation of Reglan . -Xarelto  and Plavix  held and resumed on 02/10/2023 after finishing Paxlovid . -H&H has remained stable. -Transfusion threshold hemoglobin < 8. -Will need outpatient follow-up with GI for repeat EGD.   2.  Dehydration -Status post IV fluids.   3.  Hyperthyroidism -Continue Tapazole , metoprolol .    4.  Sinus tachycardia -Likely secondary to history of hyperthyroidism patient likely has not received her beta-blocker on day of admission. -Heart rate improved with resumption of home regimen Lopressor .   -supportive care.  5.  Incidental COVID-19 -Patient noted  to have a positive SARS coronavirus COVID-19 PCR on admission. -Patient with complaints of congestion cough x 4 days prior to admission. -CRP elevated but trended down during the hospitalization.  -Patient currently not hypoxic with  sats of 97 % on room air. -s/p completion of 5-day course of Paxlovid .  -Continue scheduled Flonase , Claritin , albuterol  as needed. -Symptomatic treatment.    6.  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 -Plavix  and Xarelto  were held earlier on during the hospitalization due to concern for interactions with Paxlovid .   -Patient is status post completion of Paxlovid  02/08/2023.   -Plavix  and Xarelto  resumed after washout and patient with no noted further coffee-ground emesis.   -Continue to hold statin and resume on discharge.   -PT/OT/SLP.    7.  Diabetes mellitus type 2 with gastroparesis -Hemoglobin A1c 8.5 (11/24/2022) -CBG noted at 220 this morning. -Increase Semglee  to 14 units daily, continue SSI. -Continue oral Reglan . -Patient noted to have been on Lantus  22 units prior to admission. -Continue to follow CBGs at SNF and could further uptitrate long-acting insulin  if needed for better blood glucose control.   8.  GERD -Continue PPI.   9.  MDD -Lexapro    10.  CAD/status post CABG -Stable. -BP soft but improving and as such Ranexa  was held and will be resumed on discharge. -Continue beta-blocker with holding parameters.   11.  Hyperlipidemia -Patient's statin was held while on Paxlovid  and has completed course of Paxlovid .   -Resume statin on discharge.   12.  OSA on CPAP -Due to presentation with nausea and coffee-ground emesis CPAP initially held but subsequently resumed nightly.   -If patient unable to tolerate CPAP overnight as she felt mask was tight. -Ambulatory referral to pulmonary medicine for reevaluation for OSA.  13.  Hypomagnesemia -Status post IV magnesium .   -Magnesium  at 2.0. -Outpatient follow-up.  14.  Metabolic acidosis -Patient with bicarb trending back up currently at 22 from 19 from 20 from 20 from 15 from 19 from 21. -Patient was on bicarb drip which has been discontinued.  -Follow.  15.  Morbid  obesity -BMI 32.92 kg/m -Lifestyle modification -Outpatient follow-up with PCP.  16.  Nosebleed -Seen by ENT. -No masses seen on MRI as well as CT maxillofacial as was originally recommended by ENT and reviewed. -Patient with no further nasal bleeding overnight.  -ENT has signed off.  -Afrin as needed.      DVT prophylaxis: SCDs Code Status: Full Family Communication: Updated patient.  No family at bedside. Disposition: Medically stable as of 02/12/2023.  Was to be discharged to SNF yesterday however SNF unable to accommodate patient.  Discharge to SNF when bed available.   Status is: Observation The patient will require care spanning > 2 midnights and should be moved to inpatient because: Severity of illness   Consultants:  GI: Dr. Leigh 02/04/2023 ENT: Dr. Roark 02/05/2022  Procedures:  CT abdomen pelvis 02/04/2023 Chest x-ray 02/04/2023 CT maxillofacial 02/07/2023 Transfusion 1 unit PRBCs on 02/06/2023 Upper endoscopy per Dr. Leigh 02/06/2023  Antimicrobials:  Anti-infectives (From admission, onward)    Start     Dose/Rate Route Frequency Ordered Stop   02/05/23 1500  nirmatrelvir /ritonavir  (PAXLOVID ) 3 tablet        3 tablet Oral 2 times daily 02/05/23 0743 02/08/23 2359         Subjective: Patient lying in bed overall feels well.  Stated try CPAP machine overnight however mask was very tight and unable to  fully tolerate.  Denies any chest pain or shortness of breath.  Denies any coffee-ground emesis.  Denies any further nosebleeds overnight.  Asking whether she is going to be able to be discharged to [facility] today.   Objective: Vitals:   02/12/23 1522 02/12/23 2030 02/12/23 2203 02/13/23 0652  BP: 105/71 123/75  103/75  Pulse: 82 90  84  Resp:      Temp: 98.3 F (36.8 C) 98.5 F (36.9 C)  98.2 F (36.8 C)  TempSrc: Oral Oral  Oral  SpO2: 98% 99% 99% 97%  Weight:      Height:        Intake/Output Summary (Last 24 hours) at 02/13/2023 0855 Last  data filed at 02/13/2023 0600 Gross per 24 hour  Intake 717 ml  Output 750 ml  Net -33 ml    Filed Weights   02/10/23 0445 02/11/23 0309 02/12/23 0500  Weight: 83.3 kg 85.4 kg 84.9 kg    Examination:  General exam: NAD Respiratory system: Lungs clear to auscultation bilaterally anterior lung fields.  No wheezes, no crackles, no rhonchi.  Fair air movement.  Speaking in full sentences. Cardiovascular system: Regular rate rhythm no murmurs rubs or gallops.  No JVD.  No pitting lower extremity edema.  Gastrointestinal system: Abdomen is soft, nontender, nondistended, positive bowel sounds.  No rebound.  No guarding.   Central nervous system: Alert and oriented.  Left upper extremity in contractures.  Some dysarthric speech.  5/5 right upper extremity and right lower extremity strength.  3-4/5 left lower extremity strength.  2-3/5 left upper extremity strength.  Extremities: Symmetric 5 x 5 power. Skin: No rashes, lesions or ulcers Psychiatry: Judgement and insight appear normal. Mood & affect appropriate.     Data Reviewed: I have personally reviewed following labs and imaging studies  CBC: Recent Labs  Lab 02/08/23 0435 02/09/23 0608 02/10/23 0529 02/11/23 0654 02/12/23 0703  WBC 8.6 7.1 7.1 6.8 6.0  HGB 10.7* 10.7* 10.9* 10.6* 10.5*  HCT 32.7* 32.1* 33.1* 32.4* 31.9*  MCV 77.5* 75.9* 77.3* 77.1* 77.2*  PLT 306 368 366 384 419*    Basic Metabolic Panel: Recent Labs  Lab 02/08/23 0435 02/09/23 1211 02/10/23 0528 02/10/23 0529 02/11/23 0654 02/12/23 0703  NA 140 138  --  140 138 137  K 4.1 4.0  --  3.8 4.1 3.8  CL 112* 107  --  110 106 104  CO2 20* 20*  --  19* 22 22  GLUCOSE 119* 147*  --  97 104* 220*  BUN 9 9  --  8 9 12   CREATININE 1.06* 0.85  --  0.72 0.80 0.88  CALCIUM  8.6* 8.3*  --  8.8* 8.9 9.3  MG  --   --  2.0  --  2.0  --     GFR: Estimated Creatinine Clearance: 77.3 mL/min (by C-G formula based on SCr of 0.88 mg/dL).  Liver Function  Tests: Recent Labs  Lab 02/08/23 0435 02/09/23 1211 02/10/23 0529  AST 20 17 15   ALT 14 15 16   ALKPHOS 51 49 55  BILITOT 0.8 0.4 0.5  PROT 6.4* 6.6 6.5  ALBUMIN  3.0* 2.8* 2.6*    CBG: Recent Labs  Lab 02/12/23 0854 02/12/23 1158 02/12/23 1649 02/12/23 2256 02/13/23 0803  GLUCAP 206* 171* 135* 273* 220*     Recent Results (from the past 240 hours)  Resp panel by RT-PCR (RSV, Flu A&B, Covid) Anterior Nasal Swab     Status: Abnormal   Collection  Time: 02/04/23 10:55 PM   Specimen: Anterior Nasal Swab  Result Value Ref Range Status   SARS Coronavirus 2 by RT PCR POSITIVE (A) NEGATIVE Final   Influenza A by PCR NEGATIVE NEGATIVE Final   Influenza B by PCR NEGATIVE NEGATIVE Final    Comment: (NOTE) The Xpert Xpress SARS-CoV-2/FLU/RSV plus assay is intended as an aid in the diagnosis of influenza from Nasopharyngeal swab specimens and should not be used as a sole basis for treatment. Nasal washings and aspirates are unacceptable for Xpert Xpress SARS-CoV-2/FLU/RSV testing.  Fact Sheet for Patients: bloggercourse.com  Fact Sheet for Healthcare Providers: seriousbroker.it  This test is not yet approved or cleared by the [place]  FDA and has been authorized for detection and/or diagnosis of SARS-CoV-2 by FDA under an Emergency Use Authorization (EUA). This EUA will remain in effect (meaning this test can be used) for the duration of the COVID-19 declaration under Section 564(b)(1) of the Act, 21 U.S.C. section 360bbb-3(b)(1), unless the authorization is terminated or revoked.     Resp Syncytial Virus by PCR NEGATIVE NEGATIVE Final    Comment: (NOTE) Fact Sheet for Patients: bloggercourse.com  Fact Sheet for Healthcare Providers: seriousbroker.it  This test is not yet approved or cleared by the [place]  FDA and has been authorized for detection and/or  diagnosis of SARS-CoV-2 by FDA under an Emergency Use Authorization (EUA). This EUA will remain in effect (meaning this test can be used) for the duration of the COVID-19 declaration under Section 564(b)(1) of the Act, 21 U.S.C. section 360bbb-3(b)(1), unless the authorization is terminated or revoked.  Performed at [facility], [address], [place], [place] [address]          Radiology Studies: No results found.       Scheduled Meds:  baclofen   10 mg Oral TID   clopidogrel   75 mg Oral Daily   escitalopram   20 mg Oral q morning   ezetimibe   10 mg Oral Daily   fluticasone   2 spray Each Nare Daily   folic acid   1 mg Oral Daily   gabapentin   100 mg Oral TID   insulin  aspart  0-6 Units Subcutaneous TID WC   insulin  glargine-yfgn  14 Units Subcutaneous QHS   loratadine   10 mg Oral Daily   LORazepam   0.5 mg Oral QHS   methimazole   5 mg Oral q morning   metoCLOPramide   5 mg Oral TID AC   metoprolol  tartrate  100 mg Oral BID   multivitamin with minerals  1 tablet Oral Daily   pantoprazole   40 mg Oral BID   rivaroxaban   10 mg Oral Daily   senna-docusate  1 tablet Oral BID   sodium chloride  flush  3 mL Intravenous Q12H   tamsulosin   0.4 mg Oral QHS   topiramate   25 mg Oral BID   Continuous Infusions:  magnesium  sulfate bolus IVPB       LOS: 8 days    Time spent: 35 minutes    Toribio Hummer, MD [facility]   To contact the attending provider between 7A-7P or the covering provider during after hours 7P-7A, please log into the web site [email or web address] and access using universal [place] password for that web site. If you do not have the password, please call the hospital operator.  02/13/2023, 8:55 AM

## 2023-02-13 NOTE — Discharge Summary (Signed)
 Physician Discharge Summary  Lori [name] [name] FMW:[id number] DOB: 1970-10-27 DOA: 02/04/2023  PCP: Leonarda [name], NP  Admit date: 02/04/2023 Discharge date: 02/13/2023  Time spent: 60 minutes  Recommendations for Outpatient Follow-up:  Follow-up with MD at skilled nursing facility.  Patient will need a basic metabolic profile and magnesium  level done in 1 week to follow-up on electrolytes and renal function.  Patient will need a CBC done to follow-up on counts. Follow-up with Dr. Leigh, gastroenterology in 6 to 8 weeks. Ambulatory referral to pulmonary made for evaluation of OSA.   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   CAD (coronary artery disease)   Depression   Hyperlipidemia   Debility   History of CVA with residual deficit   MDD (major depressive disorder), recurrent episode, severe (HCC)   GERD (gastroesophageal reflux disease)   Wheelchair dependence   Morbid obesity (HCC)   Dehydration   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  Diet recommendation: Dysphagia 3 diet with thin liquids.  Filed Weights   02/10/23 0445 02/11/23 0309 02/12/23 0500  Weight: 83.3 kg 85.4 kg 84.9 kg     History of present illness:  Lori [name] is a 53 y.o. female with medical history significant of type 2 diabetes mellitus with gastroparesis, CAD status post CABG in [place] , chronic diastolic heart failure, OSA on CPAP, history of CVA with residual left-sided weakness and currently bedbound, history of DVT on Xarelto , pheochromocytoma status post right adrenalectomy, hypothyroidism with multiple recent hospitalizations (admitted 12/26 -12/27/ 2024 for transient acute encephalopathy/left facial droop/aphasia with unremarkable workup with neurology recommendations of discontinuing amlodipine  to avoid hypotension given intracranial stenosis, admission 01/22/2023 -01/23/2023 for acute metabolic encephalopathy felt likely secondary to hyperglycemia and insomnia, acute metabolic encephalopathy, 11/28 -01/04/2023 for gastroparesis )presenting to the ED with 1 day history of nausea and vomiting/coffee-ground emesis with decreased oral intake.  Patient also does endorse 1 months of black stools per her aide, occasional dizziness and lightheadedness, nausea, ongoing heartburn.  Patient denies any fevers, no chills, no chest pain, no shortness of breath, no hematochezia.  Patient does endorse occasional spinning sensation.  Patient denies any prior history of peptic ulcer disease or NSAID use.   ED Course: Patient seen in the ED noted to have another bout of coffee-ground emesis/hematemesis per ED physician.  Patient noted with a sinus tachycardia.  Hemoglobin noted at 10.5 with repeat at 10.9.  Comprehensive metabolic profile with a sodium of 134, bicarb of 21, albumin  of 3.2 otherwise within normal limits.  Lipase level at 113.  Patient given a fluid bolus, PPI 80 mg IV x 1 push, GI consulted, hospitalist called for admission for further evaluation and management.  Hospital  Course:  #1 hematemesis/coffee-ground emesis/?  Melanotic stools/acute erosive LA grade C esophagitis -Patient  presented with a 1 day history of coffee-ground emesis/hematemesis noted to have a bout of hematemesis in the ED per ED physician. -Patient noted to be chronically on Xarelto  and Plavix . -Patient also does state has had a month of black stools per her aide. -Patient denies any prior history of peptic ulcer disease, no history of NSAID use. -Hemoglobin noted to drop as low as 8.3 from 10.9 on admission on 02/06/2023, during EGD patient noted to have significant epistaxis and transfused a unit of PRBCs with hemoglobin stabilized at 10.5 by day of discharge.  -Patient received IV fluid boluses in the ED and placed on IV fluids due to dehydration and tachycardia. -Patient received Protonix  80 mg IV push x 1 on presentation to the ED, was on IV PPI twice daily and subsequently transitioned to oral PPI twice daily.  -Patient seen in consultation by GI underwent upper endoscopy with findings of erosive esophagitis/LA grade C esophagitis, single gastric polyp noted as well as single duodenal polyp noted and it was felt gastritis was likely source of bleed. -GI recommended PPI twice daily x 6 weeks and then daily thereafter in addition to continuation of Reglan . -Xarelto  and Plavix  held and resumed on 02/10/2023 after finishing Paxlovid . -Hemoglobin remained stable throughout the rest of the hospitalization and patient will be discharged in stable and improved condition. -Will need outpatient follow-up with GI for repeat EGD.   2.  Dehydration -Status post IV fluids.   3.  Hyperthyroidism -Patient maintained on home regimen Tapazole  and metoprolol .   4.  Sinus tachycardia -Likely secondary to history of hyperthyroidism patient likely had not received her beta-blocker on day of admission. -Heart rate improved with resumption of home regimen Lopressor .   -Outpatient follow-up.   5.  Incidental COVID-19 -Patient noted to have a positive SARS coronavirus COVID-19 PCR on admission. -Patient with complaints  of congestion cough x 4 days prior to admission. -CRP elevated but trended down during the hospitalization.  -Patient currently not hypoxic with sats of 97% on room air. -s/p completion of 5-day course of Paxlovid .  -Patient also maintained on scheduled Flonase , Claritin , albuterol  as needed. -Symptomatic treatment. -Patient improved clinically and will be discharged in stable and improved condition.     6.  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 -Plavix  and Xarelto  were held earlier on during the hospitalization due to concern for interactions with Paxlovid .   -Patient status post completion of Paxlovid  02/08/2023.   -Plavix  and Xarelto  resumed after washout and patient with no noted further coffee-ground emesis or bleeding noted.   -Statin was held during the hospitalization will be resumed on discharge.  -Patient seen by PT/OT/SLP and SNF recommended.   -Patient will be discharged to SNF.   7.  Diabetes mellitus type 2 with gastroparesis -Hemoglobin A1c 8.5 (11/24/2022) -CBG noted at 107 this morning. -Post correction patient noted to have a blood glucose of 60 at 5 PM the evening of 02/10/2023.   -Patient maintained on a decreased dose of long-acting Semglee  at 10 units daily as well as sliding scale insulin .   -As patient's oral intake improved CBG levels started to trend up and as such long-acting insulin /Lantus  increased to 14 units daily on day of discharge. -Patient noted prior to admission to be on 22 units of Lantus . -Lantus  doses may be adjusted at facility/uptitrated for better blood glucose control. -Patient initially placed on  IV Reglan  and subsequently transitioned to home dose oral Reglan .   -Patient was discharged on decreased dose of long-acting insulin  at 14 units daily as well as SSI.   8.  GERD -Patient maintained on PPI.   9.  MDD -Patient maintained on home regimen Lexapro .    10.  CAD/status post CABG -Stable. -BP soft  during the hospitalization as such Ranexa  held and holding parameters placed on beta-blocker.   -Ranexa  will be resumed on discharge as well as beta-blocker resumed with holding parameters.   11.  Hyperlipidemia -Patient's statin was held while on Paxlovid  and has completed course of Paxlovid .   -Statin will be resumed on discharge.   12.  OSA on CPAP -Due to presentation with nausea and coffee-ground emesis CPAP initially held but subsequently resumed nightly. -Patient noted not to have a CPAP at home per [facility], and ambulatory referral made to pulmonary for outpatient evaluation of OSA and further recommendations. -Okay for patient to be discharged to SNF, without CPAP until outpatient follow-up with pulmonary for evaluation of OSA.   13.  Hypomagnesemia -Repleted during the hospitalization.    14.  Metabolic acidosis -Resolved with bicarb drip.    15.  Morbid obesity -BMI 32.92 kg/m -Lifestyle modification -Outpatient follow-up with PCP.   16.  Nosebleed -Seen by ENT. -No masses seen on MRI as well as CT maxillofacial as was originally recommended by ENT and reviewed. -Patient with some episodic nasal bleeding which has since resolved.   -ENT has signed off.  -Patient will be placed on Afrin nasal spray as needed for nasal bleeds.   17.  Constipation -Patient complains of constipation 1 day prior to discharge, noted to have been on MiraLAX  however stated it hurt her stomach  and as such unable to tolerate it.  -Patient given 2 doses of sorbitol  with good results.   -Patient was discharged on Senokot-S twice daily as well as sorbitol  as needed.       Procedures: CT abdomen pelvis 02/04/2023 Chest x-ray 02/04/2023 CT maxillofacial 02/07/2023 Transfusion 1 unit PRBCs on 02/06/2023 Upper endoscopy per Dr. Leigh 02/06/2023  Consultations: GI: Dr. Leigh 02/04/2023 ENT: Dr. Roark 02/05/2022  Discharge Exam: Vitals:   02/12/23 2203 02/13/23 0652  BP:  103/75  Pulse:   84  Resp:    Temp:  98.2 F (36.8 C)  SpO2: 99% 97%    General: NAD Cardiovascular: RRR no murmurs rubs or gallops.  No JVD.  No lower extremity edema. Respiratory: Clear to auscultation bilaterally anterior lung fields.  No wheezes, no crackles, no rhonchi.  Fair air movement.  Discharge Instructions   Discharge Instructions     Ambulatory referral to Pulmonology   Complete by: As directed    OSA. Needs sleep study done and further eval.   Reason for referral: Other   Diet - low sodium heart healthy   Complete by: As directed    Dysphagia 3 diet with thin liquids   Increase activity slowly   Complete by: As directed    Increase activity slowly   Complete by: As directed       Allergies as of 02/13/2023       Reactions   Asa [aspirin ] Anaphylaxis, Swelling, Other (See Comments)   Throat closes   Contrast Media [iodinated Contrast Media] Anaphylaxis, Swelling   Imitrex [sumatriptan] Anaphylaxis   Ms Contin  [morphine ] Anaphylaxis, Swelling   Penicillins Swelling, Other (See Comments)   Mouth and eyes swell   Firvanq  [vancomycin ] Other (See Comments)  Red Man's Syndrome   Cipro  [ciprofloxacin  Hcl] Nausea And Vomiting   Nitrofuran Derivatives Anxiety   Wound Dressing Adhesive Itching, Rash        Medication List     TAKE these medications    acetaminophen  325 MG tablet Commonly known as: TYLENOL  Take 2 tablets (650 mg total) by mouth every 6 (six) hours as needed for mild pain (pain score 1-3) (or Fever >/= 101).   albuterol  108 (90 Base) MCG/ACT inhaler Commonly known as: VENTOLIN  HFA INHALE 2 PUFFS BY MOUTH EVERY 6 HOURS AS NEEDED FOR WHEEZING AND FOR SHORTNESS OF BREATH Strength: 108 (90 Base) MCG/ACT What changed:  how much to take how to take this when to take this reasons to take this additional instructions   albuterol  (2.5 MG/3ML) 0.083% nebulizer solution Commonly known as: PROVENTIL  Take 3 mLs (2.5 mg total) by nebulization every 6 (six)  hours as needed for wheezing or shortness of breath. What changed: Another medication with the same name was changed. Make sure you understand how and when to take each.   atorvastatin  80 MG tablet Commonly known as: LIPITOR  Take 80 mg by mouth daily.   baclofen  10 MG tablet Commonly known as: LIORESAL  Take 1 tablet (10 mg total) by mouth 3 (three) times daily.   clopidogrel  75 MG tablet Commonly known as: PLAVIX  TAKE 1 TABLET (75 MG TOTAL) BY MOUTH DAILY.   cyclobenzaprine  5 MG tablet Commonly known as: FLEXERIL  TAKE 1 TABLET BY MOUTH 3 TIMES DAILY AS NEEDED FOR MUSCLE SPASMS What changed: when to take this   escitalopram  20 MG tablet Commonly known as: LEXAPRO  Take 1 tablet (20 mg total) by mouth every morning.   ezetimibe  10 MG tablet Commonly known as: ZETIA  TAKE 1 TABLET (10 MG TOTAL) BY MOUTH DAILY.   fluticasone  50 MCG/ACT nasal spray Commonly known as: FLONASE  Place 2 sprays into both nostrils daily for 7 days.   FreeStyle Libre 14 Day Sensor Misc 1 each by Does not apply route as needed. DX: E11.69   FreeStyle Libre 2 Reader Devi 1 Device by Does not apply route daily. E11.69   gabapentin  100 MG capsule Commonly known as: NEURONTIN  Take 100 mg by mouth 3 (three) times daily.   guaiFENesin  100 MG/5ML liquid Commonly known as: ROBITUSSIN Take 5 mLs by mouth every 4 (four) hours as needed for cough or to loosen phlegm.   insulin  lispro 100 UNIT/ML injection Commonly known as: HUMALOG  Sliding scale AC, HS CBG 121 - 150: 2 units  CBG 151 - 200: 3 units  CBG 201 - 250: 5 units  CBG 251 - 300: 8 units  CBG 301 - 350: 11 units  CBG 351 - 400: 15 units  CBG > 400: call MD   Lantus  SoloStar 100 UNIT/ML Solostar Pen Generic drug: insulin  glargine Inject 14 Units into the skin at bedtime. What changed: how much to take   loratadine  10 MG tablet Commonly known as: CLARITIN  Take 1 tablet (10 mg total) by mouth daily.   LORazepam  0.5 MG tablet Commonly  known as: ATIVAN  Take 1 tablet (0.5 mg total) by mouth at bedtime.   methimazole  5 MG tablet Commonly known as: TAPAZOLE  Take 1 tablet (5 mg total) by mouth every morning.   metoCLOPramide  5 MG tablet Commonly known as: REGLAN  Take 1 tablet (5 mg total) by mouth 3 (three) times daily before meals.   metoprolol  tartrate 100 MG tablet Commonly known as: LOPRESSOR  Take 1 tablet (100 mg total) by  mouth 2 (two) times daily. Hold for SBP < 110 What changed: additional instructions   oxymetazoline  0.05 % nasal spray Commonly known as: AFRIN Place 1 spray into both nostrils 2 (two) times daily as needed for congestion (nasal bleeding).   pantoprazole  40 MG tablet Commonly known as: PROTONIX  Take 1 tablet (40 mg total) by mouth 2 (two) times daily for 42 days, THEN 1 tablet (40 mg total) daily. Start taking on: February 12, 2023 What changed: See the new instructions.   psyllium 58.6 % powder Commonly known as: METAMUCIL Take 1 packet by mouth daily.   ranolazine  500 MG 12 hr tablet Commonly known as: RANEXA  Take 1 tablet (500 mg total) by mouth 2 (two) times daily.   rivaroxaban  10 MG Tabs tablet Commonly known as: XARELTO  Take 1 tablet (10 mg total) by mouth daily.   senna-docusate 8.6-50 MG tablet Commonly known as: Senokot-S Take 1 tablet by mouth 2 (two) times daily.   sorbitol  70 % Soln Take 30 mLs by mouth daily as needed for moderate constipation.   tamsulosin  0.4 MG Caps capsule Commonly known as: FLOMAX  Take 0.4 mg by mouth at bedtime.   topiramate  25 MG tablet Commonly known as: TOPAMAX  Take 1 tablet (25 mg total) by mouth 2 (two) times daily.   Voltaren  1 % Gel Generic drug: diclofenac  Sodium Apply 2 g topically 4 (four) times daily.   zinc  oxide 11.3 % Crea cream Commonly known as: BALMEX Apply 1 Application topically 2 (two) times daily.       Allergies  Allergen Reactions   Asa [Aspirin ] Anaphylaxis, Swelling and Other (See Comments)    Throat  closes   Contrast Media [Iodinated Contrast Media] Anaphylaxis and Swelling   Imitrex [Sumatriptan] Anaphylaxis   Ms Contin  [Morphine ] Anaphylaxis and Swelling   Penicillins Swelling and Other (See Comments)    Mouth and eyes swell   Firvanq  [Vancomycin ] Other (See Comments)    Red Man's Syndrome   Cipro  [Ciprofloxacin  Hcl] Nausea And Vomiting   Nitrofuran Derivatives Anxiety   Wound Dressing Adhesive Itching and Rash    Contact information for follow-up providers     MD at SNF Follow up.          Armbruster, Elspeth SQUIBB, MD. Schedule an appointment as soon as possible for a visit in 8 week(s).   Specialty: Gastroenterology Why: Follow-up in 6 to 8 weeks. Contact information: [address] [place] [address] [phone]              Contact information for after-discharge care     Destination     [facility] Preferred SNF .   Service: Skilled Nursing Contact information: [address] [place] [facility]  [phone]                      The results of significant diagnostics from this hospitalization (including imaging, microbiology, ancillary and laboratory) are listed below for reference.    Significant Diagnostic Studies: CT MAXILLOFACIAL WO CONTRAST Result Date: 02/07/2023 CLINICAL DATA:  Question nasal foreign body. EXAM: CT MAXILLOFACIAL WITHOUT CONTRAST TECHNIQUE: Multidetector CT imaging of the maxillofacial structures was performed. Multiplanar CT image reconstructions were also generated. RADIATION DOSE REDUCTION: This exam was performed according to the departmental dose-optimization program which includes automated exposure control, adjustment of the mA and/or kV according to patient size and/or use of iterative reconstruction technique. COMPARISON:  CT of the head 01/30/2023. FINDINGS: Osseous: No fracture or mandibular  dislocation. No destructive process. Mild nasal septal deviation  the left. Orbits: Negative. No traumatic or inflammatory finding. Sinuses: There are small air-fluid levels in the sphenoid sinuses and right maxillary sinus. Soft tissues: No focal abnormality. Specifically, no radiopaque foreign body identified. Limited intracranial: No significant or unexpected finding. IMPRESSION: 1. No radiopaque foreign body identified. 2. Small air-fluid levels in the sphenoid sinuses and right maxillary sinus. Correlate for acute sinusitis. Electronically Signed   By: Greig Pique M.D.   On: 02/07/2023 16:39   CT ABDOMEN PELVIS WO CONTRAST Result Date: 02/04/2023 CLINICAL DATA:  Abdominal pain, acute (Ped 0-17y) pancreatitis?/complications//SBO?. EXAM: CT ABDOMEN AND PELVIS WITHOUT CONTRAST TECHNIQUE: Multidetector CT imaging of the abdomen and pelvis was performed following the standard protocol without IV contrast. RADIATION DOSE REDUCTION: This exam was performed according to the departmental dose-optimization program which includes automated exposure control, adjustment of the mA and/or kV according to patient size and/or use of iterative reconstruction technique. COMPARISON:  CT scan abdomen and pelvis from 11/22/2022. FINDINGS: Lower chest: There is small right pleural effusion which is new since the prior study. No left pleural effusion. The visualized lungs are otherwise clear. The heart is normal in size. No pericardial effusion. There are coronary artery atherosclerotic calcifications, in keeping with coronary artery disease. Hepatobiliary: The liver is normal in size. Non-cirrhotic configuration. No suspicious mass. No intrahepatic or extrahepatic bile duct dilation. Gallbladder is surgically absent. Pancreas: Mild atrophy of the pancreas. There are several dystrophic calcifications throughout the pancreas, nonspecific but commonly seen as a sequela of chronic pancreatitis. No suspicious pancreatic mass seen within the limitations of this unenhanced exam. No peripancreatic  fat stranding. Spleen: Within normal limits. No focal lesion. Adrenals/Urinary Tract: Unremarkable left adrenal gland. Right adrenal gland is not well visualized and may have been surgically removed. Correlate with surgical history. No suspicious renal mass within the limitations of this unenhanced exam. No nephroureterolithiasis or obstructive uropathy. Unremarkable urinary bladder. Stomach/Bowel: Redemonstration of a diverticulum arising from the third part of duodenum. No disproportionate dilation of the small or large bowel loops. No evidence of abnormal bowel wall thickening or inflammatory changes. The appendix was not visualized; however there is no acute inflammatory process in the right lower quadrant. There are multiple diverticula throughout the colon, without imaging signs of diverticulitis. Vascular/Lymphatic: No ascites or pneumoperitoneum. No abdominal or pelvic lymphadenopathy, by size criteria. No aneurysmal dilation of the major abdominal arteries. There are marked peripheral atherosclerotic vascular calcifications of the aorta and its major branches. Reproductive: The uterus is surgically absent. No large adnexal mass. Other: There is periumbilical surgical scar in small fat containing hernia. There is also irregular hyperattenuating soft tissue along the lower midline anterior abdominal wall, similar to the prior study, likely from prior surgery related fibrosis/scarring. No abnormal collection seen. Musculoskeletal: No suspicious osseous lesions. There are mild multilevel degenerative changes in the visualized spine. IMPRESSION: 1. No acute inflammatory process identified within the abdomen or pelvis. No imaging evidence of acute pancreatitis. No bowel obstruction. 2. New small right pleural effusion. 3. Multiple other nonacute observations, as described above. Aortic Atherosclerosis (ICD10-I70.0). Electronically Signed   By: Ree Molt M.D.   On: 02/04/2023 14:46   DG Chest 1 View Result  Date: 02/04/2023 CLINICAL DATA:  Cough and emesis EXAM: CHEST  1 VIEW COMPARISON:  01/31/2023 FINDINGS: Stable cardiomediastinal silhouette. Aortic atherosclerotic calcification. Sternotomy and CABG. No focal consolidation, pleural effusion, or pneumothorax. No displaced rib fractures. IMPRESSION: No active disease. Electronically Signed  By: Norman Gatlin M.D.   On: 02/04/2023 08:56   EEG adult Result Date: 01/31/2023 Shelton Arlin KIDD, MD     01/31/2023  3:56 PM Patient Name: Lori [name] [facility] [name] MRN: [id number] Epilepsy Attending: Arlin KIDD Shelton Referring Physician/Provider: Lonzell Emeline HERO, DO Date: 01/31/2023 Duration: 24.25 mins Patient history: 53yo F with ams getting eeg to evaluate for seizure Level of alertness: Awake AEDs during EEG study: None Technical aspects: This EEG study was done with scalp electrodes positioned according to the 10-20 International system of electrode placement. Electrical activity was reviewed with band pass filter of 1-70Hz , sensitivity of 7 uV/mm, display speed of 15mm/sec with a 60Hz  notched filter applied as appropriate. EEG data were recorded continuously and digitally stored.  Video monitoring was available and reviewed as appropriate. Description: The posterior dominant rhythm consists of 7.5 Hz activity of moderate voltage (25-35 uV) seen predominantly in posterior head regions, symmetric and reactive to eye opening and eye closing. EEG showed continuous generalized 5-7hz  theta slowing. There is also intermittent 2-3Hz  delta slowing in left frontal region. Hyperventilation and photic stimulation were not performed.   ABNORMALITY - Intermittent slow, right frontal region - Continuous slow, generalized IMPRESSION: This study is suggestive of cortical dysfunction arising from right frontal region likely secondary to underlying structural abnormality. Additionally there is mild to moderate diffuse encephalopathy. No seizures or epileptiform discharges  were seen throughout the recording. Priyanka O Yadav   VAS US  CAROTID Result Date: 01/31/2023 Carotid Arterial Duplex Study Patient Name:  Lori [name] [facility]  Date of Exam:   01/31/2023 Medical Rec #: [id number]                            Accession #:    [id number] Date of Birth: May 24, 1970                             Patient Gender: F Patient Age:   45 years Exam Location:  [facility] Procedure:      VAS US  CAROTID Referring Phys: EMELINE LONZELL --------------------------------------------------------------------------------  Indications:       CVA. Risk Factors:      Hypertension, Diabetes. Limitations        Today's exam was limited due to the patient's respiratory                    variation and patient movement. Comparison Study:  02/15/2022 - Right Carotid: Velocities in the right ICA are                    consistent with a 1-39%                    stenosis.                    Non-hemodynamically significant plaque <50% noted in the                    CCA.                     Left Carotid: Velocities in the left ICA are consistent with                    a 1-39%  stenosis.                    Non-hemodynamically significant plaque <50% noted in the                    CCA.                     Vertebrals: Bilateral vertebral arteries demonstrate                    antegrade flow.                    Subclavians: Normal flow hemodynamics were seen in bilateral                    subclavian                    arteries. Performing Technologist: Cordella Collet RVT  Examination Guidelines: A complete evaluation includes B-mode imaging, spectral Doppler, color Doppler, and power Doppler as needed of all accessible portions of each vessel. Bilateral testing is considered an integral part of a complete examination. Limited examinations for reoccurring indications may be performed as noted.  Right Carotid Findings:  +----------+--------+--------+--------+-----------------------+--------+           PSV cm/sEDV cm/sStenosisPlaque Description     Comments +----------+--------+--------+--------+-----------------------+--------+ CCA Prox  64      22              smooth and heterogenous         +----------+--------+--------+--------+-----------------------+--------+ CCA Distal73      25              smooth and heterogenous         +----------+--------+--------+--------+-----------------------+--------+ ICA Prox  88      22              smooth and heterogenous         +----------+--------+--------+--------+-----------------------+--------+ ICA Mid   42      20                                              +----------+--------+--------+--------+-----------------------+--------+ ICA Distal50      24                                              +----------+--------+--------+--------+-----------------------+--------+ ECA       81      15                                              +----------+--------+--------+--------+-----------------------+--------+ +----------+--------+-------+--------+-------------------+           PSV cm/sEDV cmsDescribeArm Pressure (mmHG) +----------+--------+-------+--------+-------------------+ [id number]                                        +----------+--------+-------+--------+-------------------+ +---------+--------+--+--------+-+---------+ VertebralPSV cm/s23EDV cm/s8Antegrade +---------+--------+--+--------+-+---------+  Left Carotid Findings: +----------+--------+--------+--------+-----------------------+--------+           PSV cm/sEDV cm/sStenosisPlaque Description     Comments +----------+--------+--------+--------+-----------------------+--------+ CCA  Prox  87      33              smooth and heterogenous         +----------+--------+--------+--------+-----------------------+--------+ CCA Distal101     40               smooth and heterogenous         +----------+--------+--------+--------+-----------------------+--------+ ICA Prox  56      20              smooth and heterogenous         +----------+--------+--------+--------+-----------------------+--------+ ICA Mid   54      24                                              +----------+--------+--------+--------+-----------------------+--------+ ICA Distal56      29                                              +----------+--------+--------+--------+-----------------------+--------+ ECA       195     41                                              +----------+--------+--------+--------+-----------------------+--------+ +----------+--------+--------+--------+-------------------+           PSV cm/sEDV cm/sDescribeArm Pressure (mmHG) +----------+--------+--------+--------+-------------------+ [id number]                                         +----------+--------+--------+--------+-------------------+ +---------+--------+--+--------+-+--------------+ VertebralPSV cm/s49EDV cm/s6High resistant +---------+--------+--+--------+-+--------------+   Summary: Right Carotid: Velocities in the right ICA are consistent with a 1-39% stenosis. Left Carotid: Velocities in the left ICA are consistent with a 1-39% stenosis. Vertebrals: Right vertebral artery demonstrates antegrade flow. Left vertebral             artery demonstrates high resistant flow. *See table(s) above for measurements and observations.  Electronically signed by Gaile New MD on 01/31/2023 at 11:20:45 AM.    Final    DG CHEST PORT 1 VIEW Result Date: 01/31/2023 CLINICAL DATA:  Acute encephalopathy, altered mental status, chest pain EXAM: PORTABLE CHEST 1 VIEW COMPARISON:  01/22/2023 FINDINGS: Prior CABG. Heart and mediastinal contours are within normal limits. No focal opacities or effusions. No acute bony abnormality. IMPRESSION: No active disease. Electronically  Signed   By: Franky Crease M.D.   On: 01/31/2023 00:53   MR ANGIO HEAD WO CONTRAST Result Date: 01/30/2023 CLINICAL DATA:  Stroke suspected EXAM: MRI HEAD WITHOUT CONTRAST MRA HEAD WITHOUT CONTRAST TECHNIQUE: Multiplanar, multi-echo pulse sequences of the brain and surrounding structures were acquired without intravenous contrast. Angiographic images of the Circle of Willis were acquired using MRA technique without intravenous contrast. COMPARISON:  01/20/2023 MRI head, no prior MRA available FINDINGS: MRI HEAD FINDINGS Brain: No restricted diffusion to suggest acute or subacute infarct. No acute hemorrhage, mass, mass effect, or midline shift. No hydrocephalus or extra-axial collection. Pituitary and craniocervical junction within normal limits. No hemosiderin deposition to suggest remote hemorrhage. Encephalomalacia left cerebellum and left frontal lobe. Remote lacunar infarcts in  the basal ganglia, thalami, and pons. Confluent T2 hyperintense signal in the periventricular white matter, likely the sequela of moderate to severe chronic small vessel ischemic disease. Vascular: Normal arterial flow voids. Skull and upper cervical spine: Normal marrow signal. Sinuses/Orbits: Clear paranasal sinuses. No acute finding in the orbits. Other: The mastoid air cells are well aerated. MRA HEAD FINDINGS Evaluation is somewhat limited by motion. Anterior circulation: Both internal carotid arteries are patent to the termini, with moderate to severe stenosis in the bilateral cavernous segments (series 9, image 106) and likely moderate stenosis in the bilateral supraclinoid segment (series 9, image 121). Patent left A1. Aplastic or stenosed right A1. Normal anterior communicating artery. Anterior cerebral arteries are irregular but grossly patent to their distal aspects without significant stenosis. Mild stenosis in the right M1 (series 9, image 130). No significant stenosis in the left M1. Distal MCA branches are irregular but  perfused to their distal aspects without significant stenosis. Posterior circulation: Poor signal in the vertebral arteries, which may be artifactual or reflect stenosis or occlusion. Intermittent irregular signal in the basilar artery, likely multifocal stenosis. The vertebral arteries patent to the vertebrobasilar junction without stenosis. Posterior inferior cerebral arteries patent bilaterally. Basilar patent to its distal aspect. Superior cerebellar arteries patent proximally. Aplastic or hypoplastic P1 segments, with fetal origin of the right and near fetal origin of the left PCA from the posterior communicating arteries, which appear irregular with at least mild multifocal stenosis. PCAs are irregular with at least mild multifocal stenosis, but appear perfused to their distal aspects. Anatomic variants: Fetal or near fetal origin of the PCA. IMPRESSION: 1. No acute intracranial process. No evidence of acute or subacute infarct. 2. Evaluation of the vasculature is somewhat limited by motion. 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 This could be further evaluated with a CTA of the head if clinically indicated. 3. Mild stenosis in the right M1. 4. Poor signal in the vertebral arteries, which may be artifactual or reflect stenosis or occlusion. Intermittent irregular signal in the basilar artery, likely multifocal stenosis. 5. Irregularity of the anterior and posterior circulation, which may be due to atherosclerosis and/or motion. Electronically Signed   By: Donald Campion M.D.   On: 01/30/2023 22:36   MR BRAIN WO CONTRAST Result Date: 01/30/2023 CLINICAL DATA:  Stroke suspected EXAM: MRI HEAD WITHOUT CONTRAST MRA HEAD WITHOUT CONTRAST TECHNIQUE: Multiplanar, multi-echo pulse sequences of the brain and surrounding structures were acquired without  intravenous contrast. Angiographic images of the Circle of Willis were acquired using MRA technique without intravenous contrast. COMPARISON:  01/20/2023 MRI head, no prior MRA available FINDINGS: MRI HEAD FINDINGS Brain: No restricted diffusion to suggest acute or subacute infarct. No acute hemorrhage, mass, mass effect, or midline shift. No hydrocephalus or extra-axial collection. Pituitary and craniocervical junction within normal limits. No hemosiderin deposition to suggest remote hemorrhage. Encephalomalacia left cerebellum and left frontal lobe. Remote lacunar infarcts in the basal ganglia, thalami, and pons. Confluent T2 hyperintense signal in the periventricular white matter, likely the sequela of moderate to severe chronic small vessel ischemic disease. Vascular: Normal arterial flow voids. Skull and upper cervical spine: Normal marrow signal. Sinuses/Orbits: Clear paranasal sinuses. No acute finding in the orbits. Other: The mastoid air cells are well aerated. MRA HEAD FINDINGS Evaluation is somewhat limited by motion. Anterior circulation: Both internal carotid arteries are patent to the termini,  with moderate to severe stenosis in the bilateral cavernous segments (series 9, image 106) and likely moderate stenosis in the bilateral supraclinoid segment (series 9, image 121). Patent left A1. Aplastic or stenosed right A1. Normal anterior communicating artery. Anterior cerebral arteries are irregular but grossly patent to their distal aspects without significant stenosis. Mild stenosis in the right M1 (series 9, image 130). No significant stenosis in the left M1. Distal MCA branches are irregular but perfused to their distal aspects without significant stenosis. Posterior circulation: Poor signal in the vertebral arteries, which may be artifactual or reflect stenosis or occlusion. Intermittent irregular signal in the basilar artery, likely multifocal stenosis. The vertebral arteries patent to the  vertebrobasilar junction without stenosis. Posterior inferior cerebral arteries patent bilaterally. Basilar patent to its distal aspect. Superior cerebellar arteries patent proximally. Aplastic or hypoplastic P1 segments, with fetal origin of the right and near fetal origin of the left PCA from the posterior communicating arteries, which appear irregular with at least mild multifocal stenosis. PCAs are irregular with at least mild multifocal stenosis, but appear perfused to their distal aspects. Anatomic variants: Fetal or near fetal origin of the PCA. IMPRESSION: 1. No acute intracranial process. No evidence of acute or subacute infarct. 2. Evaluation of the vasculature is somewhat limited by motion. 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 This could be further evaluated with a CTA of the head if clinically indicated. 3. Mild stenosis in the right M1. 4. Poor signal in the vertebral arteries, which may be artifactual or reflect stenosis or occlusion. Intermittent irregular signal in the basilar artery, likely multifocal stenosis. 5. Irregularity of the anterior and posterior circulation, which may be due to atherosclerosis and/or motion. Electronically Signed   By: Donald Campion M.D.   On: 01/30/2023 22:36   CT Head Wo Contrast Result Date: 01/30/2023 CLINICAL DATA:  Neuro deficit, acute, stroke suspected EXAM: CT HEAD WITHOUT CONTRAST TECHNIQUE: Contiguous axial images were obtained from the base of the skull through the vertex without intravenous contrast. RADIATION DOSE REDUCTION: This exam was performed according to the departmental dose-optimization program which includes automated exposure control, adjustment of the mA and/or kV according to patient size and/or use of iterative reconstruction technique. COMPARISON:  Head CT 01/22/2023 FINDINGS:  Brain: Chronic infarcts in the left cerebellum, left frontal lobe, bilateral basal ganglia. There is a background of mild chronic microvascular ischemic change. No hemorrhage. No hydrocephalus. No CT evidence of an acute cortical infarct. No mass effect. No lesion. Vascular: No hyperdense vessel or unexpected calcification. Vascular stents in the carotid siphons bilaterally. Skull: Normal. Negative for fracture or focal lesion. Sinuses/Orbits: No middle ear or mastoid effusion. Paranasal sinuses are clear. Orbits are unremarkable. Other: None. IMPRESSION: 1. No hemorrhage or CT evidence of an acute cortical infarct. 2. 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 Electronically Signed   By: Lyndall Gore M.D.   On: 01/30/2023 14:03   DG Chest 2 View Result Date: 01/22/2023 CLINICAL DATA:  Altered mental status. Restricted left arm movement. EXAM: CHEST - 2 VIEW COMPARISON:  01/19/2023 FINDINGS: The lateral view is limited by overlapping of the patient's arm. Normal sized heart. Clear lungs with normal vascularity. Median sternotomy wires. Mildly tortuous and partially calcified thoracic aorta. Unremarkable bones. IMPRESSION: No acute abnormality. Electronically Signed   By: Elspeth Robynn HERO.D.  On: 01/22/2023 17:25   CT Head Wo Contrast Result Date: 01/22/2023 CLINICAL DATA:  Mental status change, unknown cause EXAM: CT HEAD WITHOUT CONTRAST TECHNIQUE: Contiguous axial images were obtained from the base of the skull through the vertex without intravenous contrast. RADIATION DOSE REDUCTION: This exam was performed according to the departmental dose-optimization program which includes automated exposure control, adjustment of the mA and/or kV according to patient size and/or use of iterative reconstruction technique. COMPARISON:  CT head 01/20/2023. FINDINGS: Brain: No evidence of acute infarction, hemorrhage, hydrocephalus, extra-axial collection or mass lesion/mass  effect. Remote left cerebellar infarct. Remote basal ganglia lacunar infarcts. Remote left frontal cortical infarct. Patchy white matter hypodensities are nonspecific but compatible with chronic microvascular disease. Vascular: No hyperdense vessel. Skull: No acute fracture. Sinuses/Orbits: Clear sinuses.  No acute orbital findings. Other: No mastoid effusions. IMPRESSION: 1. No evidence of acute intracranial abnormality. 2. 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 Electronically Signed   By: Gilmore GORMAN Molt M.D.   On: 01/22/2023 16:44   MR BRAIN WO CONTRAST Result Date: 01/20/2023 CLINICAL DATA:  Neuro deficit, acute, stroke suspected. Right-sided numbness and slurred speech. EXAM: MRI HEAD WITHOUT CONTRAST TECHNIQUE: Multiplanar, multiecho pulse sequences of the brain and surrounding structures were obtained without intravenous contrast. COMPARISON:  Head CT 01/20/2023 and MRI 11/04/2022 FINDINGS: A complete MRI could not be obtained due to the patient's condition. Axial and coronal diffusion, sagittal T1, axial and coronal T2, and axial FLAIR sequences were obtained and are motion degraded, including severe motion on the coronal T2 sequence. Brain: Diffusion-weighted imaging is of good quality, and there is no evidence of an acute infarct. Advanced chronic small-vessel ischemic disease is again noted in the cerebral white matter with chronic infarcts in the deep white matter and deep gray nuclei bilaterally. A small chronic left frontal cortical infarct and moderate-sized chronic left cerebellar infarcts are also again noted. There is wallerian degeneration involving the brainstem on the right. There is mild cerebral atrophy. Lack of susceptibility weighted or gradient echo imaging limits assessment for intracranial hemorrhage. There is no midline shift or extra-axial fluid collection. Vascular: Absent flow void in the distal left vertebral artery, unchanged from the prior MRI and which  may reflect chronic occlusion or slow flow. Skull and upper cervical spine: Unremarkable bone marrow signal. Sinuses/Orbits: Grossly unchanged 1 cm FLAIR hyperintense nodule at the anterosuperior aspect of the left orbit as described on the prior MRI. Clear sinuses. Other: None. IMPRESSION: 1. Motion degraded, incomplete examination. 2. No acute infarct. 3. Advanced chronic ischemia with multiple old infarcts. Electronically Signed   By: Dasie Hamburg M.D.   On: 01/20/2023 09:33   CT HEAD CODE STROKE WO CONTRAST Addendum Date: 01/20/2023 ADDENDUM REPORT: 01/20/2023 08:47 ADDENDUM: Study briefly discussed by telephone with Dr. FREDIA SKEETER on 01/20/2023 at 0840 hours. And these results were communicated to Dr. Jerrie at 8:47 am on 01/20/2023 by text page via the [facility] messaging system. Electronically Signed   By: VEAR Hurst M.D.   On: 01/20/2023 08:47   Result Date: 01/20/2023 CLINICAL DATA:  Code stroke. 53 year old female right side numbness, abnormal speech. Last seen well 0720 hours. EXAM: CT HEAD WITHOUT CONTRAST TECHNIQUE: Contiguous axial images were obtained from the base of the skull through the vertex without intravenous contrast. RADIATION DOSE REDUCTION: This exam was performed according to the departmental dose-optimization program which includes automated exposure control, adjustment of the mA and/or kV according to patient size and/or use of iterative reconstruction technique. COMPARISON:  Brain MRI 11/03/2022.  Head CT yesterday. FINDINGS: Brain: Advanced chronic ischemic disease. Chronic encephalomalacia in the left cerebellum PICA territory, bilateral deep gray nuclei, bilateral corona radiata with some involvement of the body of the corpus callosum, left MCA anterior division. Stable cerebral volume. No midline shift, mass effect, or evidence of intracranial mass lesion. No ventriculomegaly. No acute intracranial hemorrhage identified. No cortically based acute infarct identified. Vascular:  Extensive Calcified atherosclerosis at the skull base. No suspicious intracranial vascular hyperdensity. Skull: Stable and intact. Sinuses/Orbits: Visualized paranasal sinuses and mastoids are clear. Other: No gaze deviation, acute orbit or scalp soft tissue finding. ASPECTS [facility] Stroke Program Early CT Score) Total score (0-10 with 10 being normal): 10, chronic encephalomalacia. IMPRESSION: Stable non contrast CT appearance of advanced chronic ischemic disease. No acute cortically based infarct or acute intracranial hemorrhage identified. ASPECTS 10. Electronically Signed: By: VEAR Hurst M.D. On: 01/20/2023 08:35   CT Head Wo Contrast Result Date: 01/19/2023 CLINICAL DATA:  53 year old female with history of altered mental status. EXAM: CT HEAD WITHOUT CONTRAST TECHNIQUE: Contiguous axial images were obtained from the base of the skull through the vertex without intravenous contrast. RADIATION DOSE REDUCTION: This exam was performed according to the departmental dose-optimization program which includes automated exposure control, adjustment of the mA and/or kV according to patient size and/or use of iterative reconstruction technique. COMPARISON:  Head CT 11/22/2022. FINDINGS: Brain: Mild cerebral and cerebellar atrophy. Patchy and confluent areas of decreased attenuation are noted throughout the deep and periventricular white matter of the cerebral hemispheres bilaterally, compatible with chronic microvascular ischemic disease. Multiple more well-defined areas of low attenuation in the basal ganglia bilaterally (right greater than left), similar to prior studies, compatible with old lacunar infarcts. There is also well-defined low-attenuation in the left frontal lobe and the left cerebellar hemisphere, compatible with areas of encephalomalacia from remote infarcts, similar to the prior study. No evidence of acute infarction, hemorrhage, hydrocephalus, extra-axial collection or mass lesion/mass effect.  Vascular: No hyperdense vessel or unexpected calcification. Skull: Normal. Negative for fracture or focal lesion. Sinuses/Orbits: No acute finding. Other: None. IMPRESSION: 1. No acute intracranial abnormalities. 2. Mild cerebral and cerebellar atrophy with extensive chronic microvascular ischemic changes in the cerebral white matter, old basal ganglia lacunar infarcts bilaterally, and old left frontal and left cerebellar hemisphere infarcts, similar to prior examination. Electronically Signed   By: Toribio Aye M.D.   On: 01/19/2023 12:13   DG Chest Portable 1 View Result Date: 01/19/2023 CLINICAL DATA:  53 year old female with shortness of breath, hypoglycemia, lethargy. EXAM: PORTABLE CHEST 1 VIEW COMPARISON:  Portable chest 12/25/2022 and earlier. FINDINGS: Portable AP semi upright view at 1138 hours. Low lung volumes, similar to prior. Chronic sternotomy. Mediastinal contours remain within normal limits. Visualized tracheal air column is within normal limits. Allowing for portable technique the lungs are clear. Evidence of right lung azygous vein and fissure (normal variant). Negative visible bowel gas. IMPRESSION: Low lung volumes, otherwise no acute cardiopulmonary abnormality. Electronically Signed   By: VEAR Hurst M.D.   On: 01/19/2023 12:09    Microbiology: Recent Results (from the past 240 hours)  Resp panel by RT-PCR (RSV, Flu A&B, Covid) Anterior Nasal Swab     Status: Abnormal   Collection Time: 02/04/23 10:55 PM   Specimen: Anterior Nasal Swab  Result Value Ref Range Status   SARS Coronavirus 2 by RT PCR POSITIVE (A) NEGATIVE Final   Influenza A by PCR NEGATIVE NEGATIVE Final   Influenza B by PCR  NEGATIVE NEGATIVE Final    Comment: (NOTE) The Xpert Xpress SARS-CoV-2/FLU/RSV plus assay is intended as an aid in the diagnosis of influenza from Nasopharyngeal swab specimens and should not be used as a sole basis for treatment. Nasal washings and aspirates are unacceptable for Xpert  Xpress SARS-CoV-2/FLU/RSV testing.  Fact Sheet for Patients: bloggercourse.com  Fact Sheet for Healthcare Providers: seriousbroker.it  This test is not yet approved or cleared by the [place]  FDA and has been authorized for detection and/or diagnosis of SARS-CoV-2 by FDA under an Emergency Use Authorization (EUA). This EUA will remain in effect (meaning this test can be used) for the duration of the COVID-19 declaration under Section 564(b)(1) of the Act, 21 U.S.C. section 360bbb-3(b)(1), unless the authorization is terminated or revoked.     Resp Syncytial Virus by PCR NEGATIVE NEGATIVE Final    Comment: (NOTE) Fact Sheet for Patients: bloggercourse.com  Fact Sheet for Healthcare Providers: seriousbroker.it  This test is not yet approved or cleared by the [place]  FDA and has been authorized for detection and/or diagnosis of SARS-CoV-2 by FDA under an Emergency Use Authorization (EUA). This EUA will remain in effect (meaning this test can be used) for the duration of the COVID-19 declaration under Section 564(b)(1) of the Act, 21 U.S.C. section 360bbb-3(b)(1), unless the authorization is terminated or revoked.  Performed at [facility], [address], [place], [place] [address]      Labs: Basic Metabolic Panel: Recent Labs  Lab 02/08/23 0435 02/09/23 1211 02/10/23 0528 02/10/23 0529 02/11/23 0654 02/12/23 0703  NA 140 138  --  140 138 137  K 4.1 4.0  --  3.8 4.1 3.8  CL 112* 107  --  110 106 104  CO2 20* 20*  --  19* 22 22  GLUCOSE 119* 147*  --  97 104* 220*  BUN 9 9  --  8 9 12   CREATININE 1.06* 0.85  --  0.72 0.80 0.88  CALCIUM  8.6* 8.3*  --  8.8* 8.9 9.3  MG  --   --  2.0  --  2.0  --    Liver Function Tests: Recent Labs  Lab 02/08/23 0435 02/09/23 1211 02/10/23 0529  AST 20 17 15   ALT 14 15 16   ALKPHOS 51 49 55  BILITOT 0.8 0.4  0.5  PROT 6.4* 6.6 6.5  ALBUMIN  3.0* 2.8* 2.6*   No results for input(s): LIPASE, AMYLASE in the last 168 hours. No results for input(s): AMMONIA in the last 168 hours. CBC: Recent Labs  Lab 02/08/23 0435 02/09/23 0608 02/10/23 0529 02/11/23 0654 02/12/23 0703  WBC 8.6 7.1 7.1 6.8 6.0  HGB 10.7* 10.7* 10.9* 10.6* 10.5*  HCT 32.7* 32.1* 33.1* 32.4* 31.9*  MCV 77.5* 75.9* 77.3* 77.1* 77.2*  PLT 306 368 366 384 419*   Cardiac Enzymes: No results for input(s): CKTOTAL, CKMB, CKMBINDEX, TROPONINI in the last 168 hours. BNP: BNP (last 3 results) Recent Labs    04/30/22 1245 05/22/22 0229 09/06/22 2242  BNP 26.3 49.6 19.5    ProBNP (last 3 results) No results for input(s): PROBNP in the last 8760 hours.  CBG: Recent Labs  Lab 02/12/23 0854 02/12/23 1158 02/12/23 1649 02/12/23 2256 02/13/23 0803  GLUCAP 206* 171* 135* 273* 220*       Signed:  Toribio Hummer MD.  [facility] 02/13/2023, 9:15 AM

## 2023-02-14 ENCOUNTER — Other Ambulatory Visit: Payer: Self-pay | Admitting: Family

## 2023-02-14 DIAGNOSIS — K219 Gastro-esophageal reflux disease without esophagitis: Secondary | ICD-10-CM | POA: Diagnosis not present

## 2023-02-14 DIAGNOSIS — E785 Hyperlipidemia, unspecified: Secondary | ICD-10-CM | POA: Diagnosis not present

## 2023-02-14 DIAGNOSIS — J449 Chronic obstructive pulmonary disease, unspecified: Secondary | ICD-10-CM | POA: Diagnosis not present

## 2023-02-14 DIAGNOSIS — E119 Type 2 diabetes mellitus without complications: Secondary | ICD-10-CM | POA: Diagnosis not present

## 2023-02-17 DIAGNOSIS — G9341 Metabolic encephalopathy: Secondary | ICD-10-CM | POA: Diagnosis not present

## 2023-02-17 DIAGNOSIS — J449 Chronic obstructive pulmonary disease, unspecified: Secondary | ICD-10-CM | POA: Diagnosis not present

## 2023-02-17 DIAGNOSIS — K219 Gastro-esophageal reflux disease without esophagitis: Secondary | ICD-10-CM | POA: Diagnosis not present

## 2023-02-17 DIAGNOSIS — E119 Type 2 diabetes mellitus without complications: Secondary | ICD-10-CM | POA: Diagnosis not present

## 2023-02-17 NOTE — Telephone Encounter (Signed)
 Patient has request refill on medication that's not on medication list. Medication pend and sent to PCP Ngetich, Dinah C, NP.

## 2023-02-17 NOTE — Telephone Encounter (Signed)
 Medication stopped due to Gastroparesis.

## 2023-02-18 DIAGNOSIS — K219 Gastro-esophageal reflux disease without esophagitis: Secondary | ICD-10-CM | POA: Diagnosis not present

## 2023-02-18 DIAGNOSIS — J449 Chronic obstructive pulmonary disease, unspecified: Secondary | ICD-10-CM | POA: Diagnosis not present

## 2023-02-18 DIAGNOSIS — E119 Type 2 diabetes mellitus without complications: Secondary | ICD-10-CM | POA: Diagnosis not present

## 2023-02-18 DIAGNOSIS — G9341 Metabolic encephalopathy: Secondary | ICD-10-CM | POA: Diagnosis not present

## 2023-02-19 DIAGNOSIS — K219 Gastro-esophageal reflux disease without esophagitis: Secondary | ICD-10-CM | POA: Diagnosis not present

## 2023-02-19 DIAGNOSIS — J449 Chronic obstructive pulmonary disease, unspecified: Secondary | ICD-10-CM | POA: Diagnosis not present

## 2023-02-19 DIAGNOSIS — E119 Type 2 diabetes mellitus without complications: Secondary | ICD-10-CM | POA: Diagnosis not present

## 2023-02-20 DIAGNOSIS — K219 Gastro-esophageal reflux disease without esophagitis: Secondary | ICD-10-CM | POA: Diagnosis not present

## 2023-02-20 DIAGNOSIS — J449 Chronic obstructive pulmonary disease, unspecified: Secondary | ICD-10-CM | POA: Diagnosis not present

## 2023-02-20 DIAGNOSIS — E119 Type 2 diabetes mellitus without complications: Secondary | ICD-10-CM | POA: Diagnosis not present

## 2023-02-24 ENCOUNTER — Other Ambulatory Visit: Payer: Self-pay | Admitting: Family

## 2023-02-24 DIAGNOSIS — E119 Type 2 diabetes mellitus without complications: Secondary | ICD-10-CM | POA: Diagnosis not present

## 2023-02-24 DIAGNOSIS — K219 Gastro-esophageal reflux disease without esophagitis: Secondary | ICD-10-CM | POA: Diagnosis not present

## 2023-02-24 DIAGNOSIS — J449 Chronic obstructive pulmonary disease, unspecified: Secondary | ICD-10-CM | POA: Diagnosis not present

## 2023-02-25 DIAGNOSIS — E119 Type 2 diabetes mellitus without complications: Secondary | ICD-10-CM | POA: Diagnosis not present

## 2023-02-25 DIAGNOSIS — J449 Chronic obstructive pulmonary disease, unspecified: Secondary | ICD-10-CM | POA: Diagnosis not present

## 2023-02-25 DIAGNOSIS — K219 Gastro-esophageal reflux disease without esophagitis: Secondary | ICD-10-CM | POA: Diagnosis not present

## 2023-02-25 NOTE — Telephone Encounter (Signed)
Ngetich, Donalee Citrin, NP please advise on request. Patients medication list reflects a sliding scale, however this request is showing a scheduled dosing of three times daily.

## 2023-02-25 NOTE — Telephone Encounter (Signed)
Please verify with patient current administration has been in the hospital make sure no new changes.

## 2023-02-26 DIAGNOSIS — E119 Type 2 diabetes mellitus without complications: Secondary | ICD-10-CM | POA: Diagnosis not present

## 2023-02-26 DIAGNOSIS — J449 Chronic obstructive pulmonary disease, unspecified: Secondary | ICD-10-CM | POA: Diagnosis not present

## 2023-02-26 DIAGNOSIS — K219 Gastro-esophageal reflux disease without esophagitis: Secondary | ICD-10-CM | POA: Diagnosis not present

## 2023-02-26 NOTE — Telephone Encounter (Signed)
Mychart message sent to patient and rx denial indicated patient needs to call the office

## 2023-02-28 DIAGNOSIS — E119 Type 2 diabetes mellitus without complications: Secondary | ICD-10-CM | POA: Diagnosis not present

## 2023-02-28 DIAGNOSIS — K219 Gastro-esophageal reflux disease without esophagitis: Secondary | ICD-10-CM | POA: Diagnosis not present

## 2023-02-28 DIAGNOSIS — J449 Chronic obstructive pulmonary disease, unspecified: Secondary | ICD-10-CM | POA: Diagnosis not present

## 2023-03-03 DIAGNOSIS — K219 Gastro-esophageal reflux disease without esophagitis: Secondary | ICD-10-CM | POA: Diagnosis not present

## 2023-03-03 DIAGNOSIS — J449 Chronic obstructive pulmonary disease, unspecified: Secondary | ICD-10-CM | POA: Diagnosis not present

## 2023-03-03 DIAGNOSIS — E119 Type 2 diabetes mellitus without complications: Secondary | ICD-10-CM | POA: Diagnosis not present

## 2023-03-04 DIAGNOSIS — E119 Type 2 diabetes mellitus without complications: Secondary | ICD-10-CM | POA: Diagnosis not present

## 2023-03-04 DIAGNOSIS — J449 Chronic obstructive pulmonary disease, unspecified: Secondary | ICD-10-CM | POA: Diagnosis not present

## 2023-03-04 DIAGNOSIS — K219 Gastro-esophageal reflux disease without esophagitis: Secondary | ICD-10-CM | POA: Diagnosis not present

## 2023-03-05 DIAGNOSIS — K219 Gastro-esophageal reflux disease without esophagitis: Secondary | ICD-10-CM | POA: Diagnosis not present

## 2023-03-05 DIAGNOSIS — J449 Chronic obstructive pulmonary disease, unspecified: Secondary | ICD-10-CM | POA: Diagnosis not present

## 2023-03-05 DIAGNOSIS — E119 Type 2 diabetes mellitus without complications: Secondary | ICD-10-CM | POA: Diagnosis not present

## 2023-03-06 DIAGNOSIS — K219 Gastro-esophageal reflux disease without esophagitis: Secondary | ICD-10-CM | POA: Diagnosis not present

## 2023-03-06 DIAGNOSIS — J449 Chronic obstructive pulmonary disease, unspecified: Secondary | ICD-10-CM | POA: Diagnosis not present

## 2023-03-06 DIAGNOSIS — E119 Type 2 diabetes mellitus without complications: Secondary | ICD-10-CM | POA: Diagnosis not present

## 2023-03-07 DIAGNOSIS — G9341 Metabolic encephalopathy: Secondary | ICD-10-CM | POA: Diagnosis not present

## 2023-03-07 DIAGNOSIS — E1100 Type 2 diabetes mellitus with hyperosmolarity without nonketotic hyperglycemic-hyperosmolar coma (NKHHC): Secondary | ICD-10-CM | POA: Diagnosis not present

## 2023-03-07 DIAGNOSIS — K920 Hematemesis: Secondary | ICD-10-CM | POA: Diagnosis not present

## 2023-03-07 DIAGNOSIS — J45909 Unspecified asthma, uncomplicated: Secondary | ICD-10-CM | POA: Diagnosis not present

## 2023-03-07 DIAGNOSIS — I5032 Chronic diastolic (congestive) heart failure: Secondary | ICD-10-CM | POA: Diagnosis not present

## 2023-03-07 DIAGNOSIS — E1143 Type 2 diabetes mellitus with diabetic autonomic (poly)neuropathy: Secondary | ICD-10-CM | POA: Diagnosis not present

## 2023-03-07 DIAGNOSIS — I69398 Other sequelae of cerebral infarction: Secondary | ICD-10-CM | POA: Diagnosis not present

## 2023-03-07 DIAGNOSIS — K2091 Esophagitis, unspecified with bleeding: Secondary | ICD-10-CM | POA: Diagnosis not present

## 2023-03-07 DIAGNOSIS — D649 Anemia, unspecified: Secondary | ICD-10-CM | POA: Diagnosis not present

## 2023-03-07 DIAGNOSIS — I7389 Other specified peripheral vascular diseases: Secondary | ICD-10-CM | POA: Diagnosis not present

## 2023-03-08 DIAGNOSIS — I7389 Other specified peripheral vascular diseases: Secondary | ICD-10-CM | POA: Diagnosis not present

## 2023-03-08 DIAGNOSIS — G9341 Metabolic encephalopathy: Secondary | ICD-10-CM | POA: Diagnosis not present

## 2023-03-08 DIAGNOSIS — D649 Anemia, unspecified: Secondary | ICD-10-CM | POA: Diagnosis not present

## 2023-03-08 DIAGNOSIS — J45909 Unspecified asthma, uncomplicated: Secondary | ICD-10-CM | POA: Diagnosis not present

## 2023-03-08 DIAGNOSIS — K2091 Esophagitis, unspecified with bleeding: Secondary | ICD-10-CM | POA: Diagnosis not present

## 2023-03-08 DIAGNOSIS — I69398 Other sequelae of cerebral infarction: Secondary | ICD-10-CM | POA: Diagnosis not present

## 2023-03-08 DIAGNOSIS — E1143 Type 2 diabetes mellitus with diabetic autonomic (poly)neuropathy: Secondary | ICD-10-CM | POA: Diagnosis not present

## 2023-03-08 DIAGNOSIS — K920 Hematemesis: Secondary | ICD-10-CM | POA: Diagnosis not present

## 2023-03-08 DIAGNOSIS — I5032 Chronic diastolic (congestive) heart failure: Secondary | ICD-10-CM | POA: Diagnosis not present

## 2023-03-08 DIAGNOSIS — E1100 Type 2 diabetes mellitus with hyperosmolarity without nonketotic hyperglycemic-hyperosmolar coma (NKHHC): Secondary | ICD-10-CM | POA: Diagnosis not present

## 2023-03-10 DIAGNOSIS — D649 Anemia, unspecified: Secondary | ICD-10-CM | POA: Diagnosis not present

## 2023-03-10 DIAGNOSIS — E1100 Type 2 diabetes mellitus with hyperosmolarity without nonketotic hyperglycemic-hyperosmolar coma (NKHHC): Secondary | ICD-10-CM | POA: Diagnosis not present

## 2023-03-10 DIAGNOSIS — G9341 Metabolic encephalopathy: Secondary | ICD-10-CM | POA: Diagnosis not present

## 2023-03-10 DIAGNOSIS — I7389 Other specified peripheral vascular diseases: Secondary | ICD-10-CM | POA: Diagnosis not present

## 2023-03-10 DIAGNOSIS — J45909 Unspecified asthma, uncomplicated: Secondary | ICD-10-CM | POA: Diagnosis not present

## 2023-03-10 DIAGNOSIS — K2091 Esophagitis, unspecified with bleeding: Secondary | ICD-10-CM | POA: Diagnosis not present

## 2023-03-10 DIAGNOSIS — I69398 Other sequelae of cerebral infarction: Secondary | ICD-10-CM | POA: Diagnosis not present

## 2023-03-10 DIAGNOSIS — E1143 Type 2 diabetes mellitus with diabetic autonomic (poly)neuropathy: Secondary | ICD-10-CM | POA: Diagnosis not present

## 2023-03-10 DIAGNOSIS — K920 Hematemesis: Secondary | ICD-10-CM | POA: Diagnosis not present

## 2023-03-10 DIAGNOSIS — I5032 Chronic diastolic (congestive) heart failure: Secondary | ICD-10-CM | POA: Diagnosis not present

## 2023-03-12 DIAGNOSIS — E1143 Type 2 diabetes mellitus with diabetic autonomic (poly)neuropathy: Secondary | ICD-10-CM | POA: Diagnosis not present

## 2023-03-12 DIAGNOSIS — E1100 Type 2 diabetes mellitus with hyperosmolarity without nonketotic hyperglycemic-hyperosmolar coma (NKHHC): Secondary | ICD-10-CM | POA: Diagnosis not present

## 2023-03-12 DIAGNOSIS — I7389 Other specified peripheral vascular diseases: Secondary | ICD-10-CM | POA: Diagnosis not present

## 2023-03-12 DIAGNOSIS — I69398 Other sequelae of cerebral infarction: Secondary | ICD-10-CM | POA: Diagnosis not present

## 2023-03-12 DIAGNOSIS — I5032 Chronic diastolic (congestive) heart failure: Secondary | ICD-10-CM | POA: Diagnosis not present

## 2023-03-12 DIAGNOSIS — K2091 Esophagitis, unspecified with bleeding: Secondary | ICD-10-CM | POA: Diagnosis not present

## 2023-03-12 DIAGNOSIS — J45909 Unspecified asthma, uncomplicated: Secondary | ICD-10-CM | POA: Diagnosis not present

## 2023-03-12 DIAGNOSIS — K920 Hematemesis: Secondary | ICD-10-CM | POA: Diagnosis not present

## 2023-03-12 DIAGNOSIS — G9341 Metabolic encephalopathy: Secondary | ICD-10-CM | POA: Diagnosis not present

## 2023-03-12 DIAGNOSIS — D649 Anemia, unspecified: Secondary | ICD-10-CM | POA: Diagnosis not present

## 2023-03-14 DIAGNOSIS — I69398 Other sequelae of cerebral infarction: Secondary | ICD-10-CM | POA: Diagnosis not present

## 2023-03-14 DIAGNOSIS — G9341 Metabolic encephalopathy: Secondary | ICD-10-CM | POA: Diagnosis not present

## 2023-03-14 DIAGNOSIS — K2091 Esophagitis, unspecified with bleeding: Secondary | ICD-10-CM | POA: Diagnosis not present

## 2023-03-14 DIAGNOSIS — I5032 Chronic diastolic (congestive) heart failure: Secondary | ICD-10-CM | POA: Diagnosis not present

## 2023-03-14 DIAGNOSIS — E1143 Type 2 diabetes mellitus with diabetic autonomic (poly)neuropathy: Secondary | ICD-10-CM | POA: Diagnosis not present

## 2023-03-14 DIAGNOSIS — J45909 Unspecified asthma, uncomplicated: Secondary | ICD-10-CM | POA: Diagnosis not present

## 2023-03-14 DIAGNOSIS — E1100 Type 2 diabetes mellitus with hyperosmolarity without nonketotic hyperglycemic-hyperosmolar coma (NKHHC): Secondary | ICD-10-CM | POA: Diagnosis not present

## 2023-03-14 DIAGNOSIS — K920 Hematemesis: Secondary | ICD-10-CM | POA: Diagnosis not present

## 2023-03-14 DIAGNOSIS — D649 Anemia, unspecified: Secondary | ICD-10-CM | POA: Diagnosis not present

## 2023-03-14 DIAGNOSIS — I7389 Other specified peripheral vascular diseases: Secondary | ICD-10-CM | POA: Diagnosis not present

## 2023-03-16 DIAGNOSIS — K920 Hematemesis: Secondary | ICD-10-CM | POA: Diagnosis not present

## 2023-03-16 DIAGNOSIS — J45909 Unspecified asthma, uncomplicated: Secondary | ICD-10-CM | POA: Diagnosis not present

## 2023-03-16 DIAGNOSIS — E1143 Type 2 diabetes mellitus with diabetic autonomic (poly)neuropathy: Secondary | ICD-10-CM | POA: Diagnosis not present

## 2023-03-16 DIAGNOSIS — I5032 Chronic diastolic (congestive) heart failure: Secondary | ICD-10-CM | POA: Diagnosis not present

## 2023-03-16 DIAGNOSIS — K2091 Esophagitis, unspecified with bleeding: Secondary | ICD-10-CM | POA: Diagnosis not present

## 2023-03-16 DIAGNOSIS — I69398 Other sequelae of cerebral infarction: Secondary | ICD-10-CM | POA: Diagnosis not present

## 2023-03-16 DIAGNOSIS — D649 Anemia, unspecified: Secondary | ICD-10-CM | POA: Diagnosis not present

## 2023-03-16 DIAGNOSIS — G9341 Metabolic encephalopathy: Secondary | ICD-10-CM | POA: Diagnosis not present

## 2023-03-16 DIAGNOSIS — I7389 Other specified peripheral vascular diseases: Secondary | ICD-10-CM | POA: Diagnosis not present

## 2023-03-16 DIAGNOSIS — E1100 Type 2 diabetes mellitus with hyperosmolarity without nonketotic hyperglycemic-hyperosmolar coma (NKHHC): Secondary | ICD-10-CM | POA: Diagnosis not present

## 2023-03-17 ENCOUNTER — Other Ambulatory Visit: Payer: Self-pay | Admitting: Family

## 2023-03-17 DIAGNOSIS — G9341 Metabolic encephalopathy: Secondary | ICD-10-CM | POA: Diagnosis not present

## 2023-03-17 DIAGNOSIS — E1143 Type 2 diabetes mellitus with diabetic autonomic (poly)neuropathy: Secondary | ICD-10-CM | POA: Diagnosis not present

## 2023-03-17 DIAGNOSIS — I7389 Other specified peripheral vascular diseases: Secondary | ICD-10-CM | POA: Diagnosis not present

## 2023-03-17 DIAGNOSIS — I5032 Chronic diastolic (congestive) heart failure: Secondary | ICD-10-CM | POA: Diagnosis not present

## 2023-03-17 DIAGNOSIS — E1100 Type 2 diabetes mellitus with hyperosmolarity without nonketotic hyperglycemic-hyperosmolar coma (NKHHC): Secondary | ICD-10-CM | POA: Diagnosis not present

## 2023-03-17 DIAGNOSIS — K2091 Esophagitis, unspecified with bleeding: Secondary | ICD-10-CM | POA: Diagnosis not present

## 2023-03-17 DIAGNOSIS — D649 Anemia, unspecified: Secondary | ICD-10-CM | POA: Diagnosis not present

## 2023-03-17 DIAGNOSIS — J45909 Unspecified asthma, uncomplicated: Secondary | ICD-10-CM | POA: Diagnosis not present

## 2023-03-17 DIAGNOSIS — K920 Hematemesis: Secondary | ICD-10-CM | POA: Diagnosis not present

## 2023-03-17 DIAGNOSIS — I69398 Other sequelae of cerebral infarction: Secondary | ICD-10-CM | POA: Diagnosis not present

## 2023-03-19 DIAGNOSIS — I69398 Other sequelae of cerebral infarction: Secondary | ICD-10-CM | POA: Diagnosis not present

## 2023-03-19 DIAGNOSIS — J45909 Unspecified asthma, uncomplicated: Secondary | ICD-10-CM | POA: Diagnosis not present

## 2023-03-19 DIAGNOSIS — E1143 Type 2 diabetes mellitus with diabetic autonomic (poly)neuropathy: Secondary | ICD-10-CM | POA: Diagnosis not present

## 2023-03-19 DIAGNOSIS — E1100 Type 2 diabetes mellitus with hyperosmolarity without nonketotic hyperglycemic-hyperosmolar coma (NKHHC): Secondary | ICD-10-CM | POA: Diagnosis not present

## 2023-03-19 DIAGNOSIS — D649 Anemia, unspecified: Secondary | ICD-10-CM | POA: Diagnosis not present

## 2023-03-19 DIAGNOSIS — K920 Hematemesis: Secondary | ICD-10-CM | POA: Diagnosis not present

## 2023-03-19 DIAGNOSIS — I5032 Chronic diastolic (congestive) heart failure: Secondary | ICD-10-CM | POA: Diagnosis not present

## 2023-03-19 DIAGNOSIS — I7389 Other specified peripheral vascular diseases: Secondary | ICD-10-CM | POA: Diagnosis not present

## 2023-03-19 DIAGNOSIS — G9341 Metabolic encephalopathy: Secondary | ICD-10-CM | POA: Diagnosis not present

## 2023-03-19 DIAGNOSIS — K2091 Esophagitis, unspecified with bleeding: Secondary | ICD-10-CM | POA: Diagnosis not present

## 2023-03-21 DIAGNOSIS — J45909 Unspecified asthma, uncomplicated: Secondary | ICD-10-CM | POA: Diagnosis not present

## 2023-03-21 DIAGNOSIS — K920 Hematemesis: Secondary | ICD-10-CM | POA: Diagnosis not present

## 2023-03-21 DIAGNOSIS — I5032 Chronic diastolic (congestive) heart failure: Secondary | ICD-10-CM | POA: Diagnosis not present

## 2023-03-21 DIAGNOSIS — I7389 Other specified peripheral vascular diseases: Secondary | ICD-10-CM | POA: Diagnosis not present

## 2023-03-21 DIAGNOSIS — E1100 Type 2 diabetes mellitus with hyperosmolarity without nonketotic hyperglycemic-hyperosmolar coma (NKHHC): Secondary | ICD-10-CM | POA: Diagnosis not present

## 2023-03-21 DIAGNOSIS — E1143 Type 2 diabetes mellitus with diabetic autonomic (poly)neuropathy: Secondary | ICD-10-CM | POA: Diagnosis not present

## 2023-03-21 DIAGNOSIS — D649 Anemia, unspecified: Secondary | ICD-10-CM | POA: Diagnosis not present

## 2023-03-21 DIAGNOSIS — G9341 Metabolic encephalopathy: Secondary | ICD-10-CM | POA: Diagnosis not present

## 2023-03-21 DIAGNOSIS — I69398 Other sequelae of cerebral infarction: Secondary | ICD-10-CM | POA: Diagnosis not present

## 2023-03-21 DIAGNOSIS — K2091 Esophagitis, unspecified with bleeding: Secondary | ICD-10-CM | POA: Diagnosis not present

## 2023-03-22 DIAGNOSIS — I5032 Chronic diastolic (congestive) heart failure: Secondary | ICD-10-CM | POA: Diagnosis not present

## 2023-03-22 DIAGNOSIS — I69398 Other sequelae of cerebral infarction: Secondary | ICD-10-CM | POA: Diagnosis not present

## 2023-03-22 DIAGNOSIS — D649 Anemia, unspecified: Secondary | ICD-10-CM | POA: Diagnosis not present

## 2023-03-22 DIAGNOSIS — K2091 Esophagitis, unspecified with bleeding: Secondary | ICD-10-CM | POA: Diagnosis not present

## 2023-03-22 DIAGNOSIS — G9341 Metabolic encephalopathy: Secondary | ICD-10-CM | POA: Diagnosis not present

## 2023-03-22 DIAGNOSIS — K920 Hematemesis: Secondary | ICD-10-CM | POA: Diagnosis not present

## 2023-03-22 DIAGNOSIS — I7389 Other specified peripheral vascular diseases: Secondary | ICD-10-CM | POA: Diagnosis not present

## 2023-03-22 DIAGNOSIS — E1100 Type 2 diabetes mellitus with hyperosmolarity without nonketotic hyperglycemic-hyperosmolar coma (NKHHC): Secondary | ICD-10-CM | POA: Diagnosis not present

## 2023-03-22 DIAGNOSIS — E1143 Type 2 diabetes mellitus with diabetic autonomic (poly)neuropathy: Secondary | ICD-10-CM | POA: Diagnosis not present

## 2023-03-22 DIAGNOSIS — J45909 Unspecified asthma, uncomplicated: Secondary | ICD-10-CM | POA: Diagnosis not present

## 2023-03-24 DIAGNOSIS — G9341 Metabolic encephalopathy: Secondary | ICD-10-CM | POA: Diagnosis not present

## 2023-03-24 DIAGNOSIS — I69398 Other sequelae of cerebral infarction: Secondary | ICD-10-CM | POA: Diagnosis not present

## 2023-03-24 DIAGNOSIS — K920 Hematemesis: Secondary | ICD-10-CM | POA: Diagnosis not present

## 2023-03-24 DIAGNOSIS — E1100 Type 2 diabetes mellitus with hyperosmolarity without nonketotic hyperglycemic-hyperosmolar coma (NKHHC): Secondary | ICD-10-CM | POA: Diagnosis not present

## 2023-03-24 DIAGNOSIS — D649 Anemia, unspecified: Secondary | ICD-10-CM | POA: Diagnosis not present

## 2023-03-24 DIAGNOSIS — I7389 Other specified peripheral vascular diseases: Secondary | ICD-10-CM | POA: Diagnosis not present

## 2023-03-24 DIAGNOSIS — K2091 Esophagitis, unspecified with bleeding: Secondary | ICD-10-CM | POA: Diagnosis not present

## 2023-03-24 DIAGNOSIS — E1143 Type 2 diabetes mellitus with diabetic autonomic (poly)neuropathy: Secondary | ICD-10-CM | POA: Diagnosis not present

## 2023-03-24 DIAGNOSIS — I5032 Chronic diastolic (congestive) heart failure: Secondary | ICD-10-CM | POA: Diagnosis not present

## 2023-03-24 DIAGNOSIS — J45909 Unspecified asthma, uncomplicated: Secondary | ICD-10-CM | POA: Diagnosis not present

## 2023-03-26 DIAGNOSIS — I5032 Chronic diastolic (congestive) heart failure: Secondary | ICD-10-CM | POA: Diagnosis not present

## 2023-03-26 DIAGNOSIS — D649 Anemia, unspecified: Secondary | ICD-10-CM | POA: Diagnosis not present

## 2023-03-26 DIAGNOSIS — I7389 Other specified peripheral vascular diseases: Secondary | ICD-10-CM | POA: Diagnosis not present

## 2023-03-26 DIAGNOSIS — G9341 Metabolic encephalopathy: Secondary | ICD-10-CM | POA: Diagnosis not present

## 2023-03-26 DIAGNOSIS — E1100 Type 2 diabetes mellitus with hyperosmolarity without nonketotic hyperglycemic-hyperosmolar coma (NKHHC): Secondary | ICD-10-CM | POA: Diagnosis not present

## 2023-03-26 DIAGNOSIS — I69398 Other sequelae of cerebral infarction: Secondary | ICD-10-CM | POA: Diagnosis not present

## 2023-03-26 DIAGNOSIS — E1143 Type 2 diabetes mellitus with diabetic autonomic (poly)neuropathy: Secondary | ICD-10-CM | POA: Diagnosis not present

## 2023-03-26 DIAGNOSIS — K920 Hematemesis: Secondary | ICD-10-CM | POA: Diagnosis not present

## 2023-03-26 DIAGNOSIS — J45909 Unspecified asthma, uncomplicated: Secondary | ICD-10-CM | POA: Diagnosis not present

## 2023-03-26 DIAGNOSIS — K2091 Esophagitis, unspecified with bleeding: Secondary | ICD-10-CM | POA: Diagnosis not present

## 2023-03-29 DIAGNOSIS — D649 Anemia, unspecified: Secondary | ICD-10-CM | POA: Diagnosis not present

## 2023-03-29 DIAGNOSIS — I5032 Chronic diastolic (congestive) heart failure: Secondary | ICD-10-CM | POA: Diagnosis not present

## 2023-03-29 DIAGNOSIS — J45909 Unspecified asthma, uncomplicated: Secondary | ICD-10-CM | POA: Diagnosis not present

## 2023-03-29 DIAGNOSIS — I69398 Other sequelae of cerebral infarction: Secondary | ICD-10-CM | POA: Diagnosis not present

## 2023-03-29 DIAGNOSIS — K2091 Esophagitis, unspecified with bleeding: Secondary | ICD-10-CM | POA: Diagnosis not present

## 2023-03-29 DIAGNOSIS — K920 Hematemesis: Secondary | ICD-10-CM | POA: Diagnosis not present

## 2023-03-29 DIAGNOSIS — E1100 Type 2 diabetes mellitus with hyperosmolarity without nonketotic hyperglycemic-hyperosmolar coma (NKHHC): Secondary | ICD-10-CM | POA: Diagnosis not present

## 2023-03-29 DIAGNOSIS — E1143 Type 2 diabetes mellitus with diabetic autonomic (poly)neuropathy: Secondary | ICD-10-CM | POA: Diagnosis not present

## 2023-03-29 DIAGNOSIS — I7389 Other specified peripheral vascular diseases: Secondary | ICD-10-CM | POA: Diagnosis not present

## 2023-03-29 DIAGNOSIS — G9341 Metabolic encephalopathy: Secondary | ICD-10-CM | POA: Diagnosis not present

## 2023-03-31 ENCOUNTER — Other Ambulatory Visit: Payer: Self-pay | Admitting: Family

## 2023-03-31 NOTE — Telephone Encounter (Signed)
 Medication hasn't been refilled by PCP Ngetich, Dinah C, NP yet. Medication pend and sent to PCP for approval.

## 2023-04-03 ENCOUNTER — Encounter: Payer: Self-pay | Admitting: Family

## 2023-04-03 ENCOUNTER — Ambulatory Visit: Payer: 59 | Admitting: Family

## 2023-04-03 VITALS — BP 114/80 | HR 128 | Temp 97.9°F | Resp 21 | Ht 62.0 in | Wt 184.0 lb

## 2023-04-03 DIAGNOSIS — E0590 Thyrotoxicosis, unspecified without thyrotoxic crisis or storm: Secondary | ICD-10-CM | POA: Diagnosis not present

## 2023-04-03 DIAGNOSIS — R2681 Unsteadiness on feet: Secondary | ICD-10-CM | POA: Diagnosis not present

## 2023-04-03 DIAGNOSIS — E785 Hyperlipidemia, unspecified: Secondary | ICD-10-CM

## 2023-04-03 DIAGNOSIS — K219 Gastro-esophageal reflux disease without esophagitis: Secondary | ICD-10-CM

## 2023-04-03 DIAGNOSIS — R040 Epistaxis: Secondary | ICD-10-CM

## 2023-04-03 DIAGNOSIS — J4520 Mild intermittent asthma, uncomplicated: Secondary | ICD-10-CM | POA: Diagnosis not present

## 2023-04-03 DIAGNOSIS — Z8673 Personal history of transient ischemic attack (TIA), and cerebral infarction without residual deficits: Secondary | ICD-10-CM | POA: Diagnosis not present

## 2023-04-03 DIAGNOSIS — K5901 Slow transit constipation: Secondary | ICD-10-CM

## 2023-04-03 DIAGNOSIS — F332 Major depressive disorder, recurrent severe without psychotic features: Secondary | ICD-10-CM

## 2023-04-03 DIAGNOSIS — E1169 Type 2 diabetes mellitus with other specified complication: Secondary | ICD-10-CM | POA: Diagnosis not present

## 2023-04-03 DIAGNOSIS — I10 Essential (primary) hypertension: Secondary | ICD-10-CM

## 2023-04-04 LAB — CBC WITH DIFFERENTIAL/PLATELET
Absolute Lymphocytes: 2630 {cells}/uL (ref 850–3900)
Absolute Monocytes: 524 {cells}/uL (ref 200–950)
Basophils Absolute: 53 {cells}/uL (ref 0–200)
Basophils Relative: 0.7 %
Eosinophils Absolute: 152 {cells}/uL (ref 15–500)
Eosinophils Relative: 2 %
HCT: 40.8 % (ref 35.0–45.0)
Hemoglobin: 12.8 g/dL (ref 11.7–15.5)
MCH: 25.9 pg — ABNORMAL LOW (ref 27.0–33.0)
MCHC: 31.4 g/dL — ABNORMAL LOW (ref 32.0–36.0)
MCV: 82.4 fL (ref 80.0–100.0)
MPV: 10 fL (ref 7.5–12.5)
Monocytes Relative: 6.9 %
Neutro Abs: 4241 {cells}/uL (ref 1500–7800)
Neutrophils Relative %: 55.8 %
Platelets: 366 10*3/uL (ref 140–400)
RBC: 4.95 10*6/uL (ref 3.80–5.10)
RDW: 18.7 % — ABNORMAL HIGH (ref 11.0–15.0)
Total Lymphocyte: 34.6 %
WBC: 7.6 10*3/uL (ref 3.8–10.8)

## 2023-04-04 LAB — BASIC METABOLIC PANEL
BUN: 20 mg/dL (ref 7–25)
CO2: 23 mmol/L (ref 20–32)
Calcium: 9.5 mg/dL (ref 8.6–10.4)
Chloride: 99 mmol/L (ref 98–110)
Creat: 0.78 mg/dL (ref 0.50–1.03)
Glucose, Bld: 409 mg/dL — ABNORMAL HIGH (ref 65–139)
Potassium: 4.8 mmol/L (ref 3.5–5.3)
Sodium: 135 mmol/L (ref 135–146)

## 2023-04-04 LAB — MAGNESIUM: Magnesium: 1.6 mg/dL (ref 1.5–2.5)

## 2023-04-06 NOTE — Progress Notes (Signed)
 Provider: Richarda Blade FNP-C  Brendan Gruwell, Donalee Citrin, NP  Patient Care Team: Monaca Wadas, Donalee Citrin, NP as PCP - General (Family Medicine) Orbie Pyo, MD as PCP - Cardiology (Cardiology) [facility]  Extended Emergency Contact Information Primary Emergency Contact: [name],[name] Mobile Phone: [phone] Relation: Spouse Preferred language: English Interpreter needed? No Secondary Emergency Contact: [name],[name] Home Phone: [phone] Mobile Phone: [phone] Relation: Daughter Mother: [facility] Phone: [phone] Mobile Phone: [phone]  Code Status: Full Code  Goals of care: Advanced Directive information    04/03/2023    2:20 PM  Advanced Directives  Does Patient Have a Medical Advance Directive? No  Would patient like information on creating a medical advance directive? No - Patient declined     Chief Complaint  Patient presents with   Medical Management of Chronic Issues    Recently released from rehab facility. Discuss the need AWV, pneumococcal vaccine, foot exam, eye exam, diabetic kidney evaluation, colonoscopy, covid, mammogram and shingles. Patients wants to get a referral to see a foot doctor.    Discussed the use of AI scribe software for clinical note transcription with the patient, who gave verbal consent to proceed.  History of Present Illness   Lori [name] is a 53 year old female with gastrointestinal bleeding and hyperthyroidism who presents for follow-up after recent hospitalization from 02/04/2023 - 02/13/2023 then discharged to SNF. She was recently discharged from SNF. She is here with the Husband who provides additional HPI information.   She was hospitalized from February 04, 2023, to February 05, 2023, due to gastrointestinal bleeding, presenting with coffee ground emesis and hematemesis. Her hemoglobin dropped from 10.5 to 8.3, necessitating a transfusion. An EGD revealed LA grade C  esophagitis, a gastric polyp, and a duodenal polyp, with gastritis suspected as the bleeding source. She was treated with IV fluids, Protonix, and a blood transfusion, stabilizing her hemoglobin to 10.5. She was discharged with a recommendation to continue PPI twice daily for six weeks and Reglan. Since discharge, no further episodes of vomiting or gastrointestinal bleeding have occurred, and she continues to take Protonix.  During her hospitalization, her medications Xarelto and Plavix were held due to bleeding concerns and were resumed on February 10, 2023, after completing a course of Paxlovid for incidental COVID-19. She was also treated for hyperthyroidism with Tapazole and metoprolol, which were continued. No recent chest pain or palpitations have been reported.  She has a history of type 2 diabetes, with a hemoglobin A1c of 8.5% noted on November 24, 2022. During her hospitalization, her blood glucose was 107, and her insulin regimen was adjusted to 10 units daily. She uses Humalog on a sliding scale and Lantus 14 units daily.  She has a history of coronary artery disease and hyperlipidemia, with Ranexa and atorvastatin resumed on discharge. Her CPAP for obstructive sleep apnea was also resumed after being held during her hospitalization.  She was evaluated by physical therapy, occupational therapy, and speech therapy during her hospitalization and was discharged to a skilled nursing facility. She uses a walker and has been able to walk to her living room and kitchen. She is scheduled for home physical therapy and speech therapy. Some itching is present, and she uses Aveeno anti-itch lotion.         Past Medical History:  Diagnosis Date   CAD (coronary artery disease)    Depression    Diabetes mellitus without complication (HCC)    History of CT scan    History of mammogram  History of MRI    Hypertension    Pheochromocytoma    Reversible cerebrovascular vasoconstriction syndrome     Stroke [facility])    Thyroid disease    Past Surgical History:  Procedure Laterality Date   ABDOMINAL HYSTERECTOMY     BIOPSY  02/06/2023   Procedure: BIOPSY;  Surgeon: Benancio Deeds, MD;  Location: [facility] ENDOSCOPY;  Service: Gastroenterology;;   CESAREAN SECTION     3   CORONARY ARTERY BYPASS GRAFT     ESOPHAGOGASTRODUODENOSCOPY (EGD) WITH PROPOFOL N/A 02/06/2023   Procedure: ESOPHAGOGASTRODUODENOSCOPY (EGD) WITH PROPOFOL;  Surgeon: Benancio Deeds, MD;  Location: [facility] ENDOSCOPY;  Service: Gastroenterology;  Laterality: N/A;   Right adrenal gland removal for pheochromocytoma Right     Allergies  Allergen Reactions   Asa [Aspirin] Anaphylaxis, Swelling and Other (See Comments)    Throat closes   Contrast Media [Iodinated Contrast Media] Anaphylaxis and Swelling   Imitrex [Sumatriptan] Anaphylaxis   Ms Contin [Morphine] Anaphylaxis and Swelling   Penicillins Swelling and Other (See Comments)    Mouth and eyes swell   Firvanq [Vancomycin] Other (See Comments)    Red Man's Syndrome   Cipro [Ciprofloxacin Hcl] Nausea And Vomiting   Nitrofuran Derivatives Anxiety   Wound Dressing Adhesive Itching and Rash    Outpatient Encounter Medications as of 04/03/2023  Medication Sig   acetaminophen (TYLENOL) 325 MG tablet Take 2 tablets (650 mg total) by mouth every 6 (six) hours as needed for mild pain (pain score 1-3) (or Fever >/= 101).   albuterol (PROVENTIL) (2.5 MG/3ML) 0.083% nebulizer solution Take 3 mLs (2.5 mg total) by nebulization every 6 (six) hours as needed for wheezing or shortness of breath.   albuterol (VENTOLIN HFA) 108 (90 Base) MCG/ACT inhaler INHALE 2 PUFFS BY MOUTH EVERY 6 HOURS AS NEEDED FOR WHEEZING AND FOR SHORTNESS OF BREATH Strength: 108 (90 Base) MCG/ACT (Patient taking differently: Inhale 2 puffs into the lungs every 6 (six) hours as needed for shortness of breath.)   atorvastatin (LIPITOR) 80 MG tablet TAKE 1 TABLET (80 MG TOTAL) BY MOUTH DAILY.   baclofen  (LIORESAL) 10 MG tablet Take 1 tablet (10 mg total) by mouth 3 (three) times daily.   clopidogrel (PLAVIX) 75 MG tablet TAKE 1 TABLET (75 MG TOTAL) BY MOUTH DAILY.   Continuous Glucose Receiver (FREESTYLE LIBRE 2 READER) DEVI 1 Device by Does not apply route daily. E11.69   Continuous Glucose Sensor (FREESTYLE LIBRE 14 DAY SENSOR) MISC 1 each by Does not apply route as needed. DX: E11.69   cyclobenzaprine (FLEXERIL) 5 MG tablet TAKE 1 TABLET BY MOUTH 3 TIMES DAILY AS NEEDED FOR MUSCLE SPASMS (Patient taking differently: Take 5 mg by mouth at bedtime.)   diclofenac Sodium (VOLTAREN) 1 % GEL Apply 2 g topically 4 (four) times daily.   escitalopram (LEXAPRO) 20 MG tablet Take 1 tablet (20 mg total) by mouth every morning.   ezetimibe (ZETIA) 10 MG tablet TAKE 1 TABLET (10 MG TOTAL) BY MOUTH DAILY.   gabapentin (NEURONTIN) 100 MG capsule Take 100 mg by mouth 3 (three) times daily.   guaiFENesin (ROBITUSSIN) 100 MG/5ML liquid Take 5 mLs by mouth every 4 (four) hours as needed for cough or to loosen phlegm.   insulin glargine (LANTUS SOLOSTAR) 100 UNIT/ML Solostar Pen Inject 14 Units into the skin at bedtime.   insulin lispro (HUMALOG) 100 UNIT/ML injection Sliding scale AC, HS CBG 121 - 150: 2 units  CBG 151 - 200: 3 units  CBG 201 -  250: 5 units  CBG 251 - 300: 8 units  CBG 301 - 350: 11 units  CBG 351 - 400: 15 units  CBG > 400: call MD   loratadine (CLARITIN) 10 MG tablet Take 1 tablet (10 mg total) by mouth daily.   LORazepam (ATIVAN) 0.5 MG tablet Take 1 tablet (0.5 mg total) by mouth at bedtime.   methimazole (TAPAZOLE) 5 MG tablet Take 1 tablet (5 mg total) by mouth every morning.   metoCLOPramide (REGLAN) 5 MG tablet Take 1 tablet (5 mg total) by mouth 3 (three) times daily before meals.   metoprolol tartrate (LOPRESSOR) 100 MG tablet Take 1 tablet (100 mg total) by mouth 2 (two) times daily. Hold for SBP < 110   oxymetazoline (AFRIN) 0.05 % nasal spray Place 1 spray into both nostrils 2  (two) times daily as needed for congestion (nasal bleeding).   pantoprazole (PROTONIX) 40 MG tablet Take 1 tablet (40 mg total) by mouth 2 (two) times daily for 42 days, THEN 1 tablet (40 mg total) daily.   psyllium (METAMUCIL) 58.6 % powder Take 1 packet by mouth daily.   ranolazine (RANEXA) 500 MG 12 hr tablet Take 1 tablet (500 mg total) by mouth 2 (two) times daily.   rivaroxaban (XARELTO) 10 MG TABS tablet Take 1 tablet (10 mg total) by mouth daily.   senna-docusate (SENOKOT-S) 8.6-50 MG tablet Take 1 tablet by mouth 2 (two) times daily.   sorbitol 70 % SOLN Take 30 mLs by mouth daily as needed for moderate constipation.   tamsulosin (FLOMAX) 0.4 MG CAPS capsule Take 0.4 mg by mouth at bedtime.   topiramate (TOPAMAX) 25 MG tablet Take 1 tablet (25 mg total) by mouth 2 (two) times daily.   zinc oxide (BALMEX) 11.3 % CREA cream Apply 1 Application topically 2 (two) times daily.   fluticasone (FLONASE) 50 MCG/ACT nasal spray Place 2 sprays into both nostrils daily for 7 days.   No facility-administered encounter medications on file as of 04/03/2023.    Review of Systems  Constitutional:  Negative for appetite change, chills, fatigue, fever and unexpected weight change.  HENT:  Negative for congestion, ear discharge, ear pain, facial swelling, hearing loss, nosebleeds, postnasal drip, rhinorrhea, sinus pressure, sinus pain, sneezing, sore throat, tinnitus and trouble swallowing.   Eyes:  Negative for pain, discharge, redness, itching and visual disturbance.  Respiratory:  Negative for cough, chest tightness, shortness of breath and wheezing.   Cardiovascular:  Negative for chest pain, palpitations and leg swelling.  Gastrointestinal:  Negative for abdominal distention, abdominal pain, blood in stool, constipation, diarrhea, nausea and vomiting.  Endocrine: Negative for cold intolerance, heat intolerance, polydipsia, polyphagia and polyuria.  Genitourinary:  Negative for difficulty urinating,  dysuria, flank pain, frequency and urgency.       Bladder Incontinent  Musculoskeletal:  Positive for gait problem. Negative for arthralgias, back pain, joint swelling, myalgias, neck pain and neck stiffness.  Skin:  Negative for color change, pallor, rash and wound.  Neurological:  Positive for weakness. Negative for dizziness, syncope, speech difficulty, light-headedness, numbness and headaches.       Left hand contracture   Hematological:  Does not bruise/bleed easily.  Psychiatric/Behavioral:  Negative for agitation, behavioral problems, confusion, hallucinations, self-injury, sleep disturbance and suicidal ideas. The patient is not nervous/anxious.     Immunization History  Administered Date(s) Administered   Influenza-Unspecified 11/04/2016   PPD Test 03/19/2016   Tdap 02/07/2014   Pertinent  Health Maintenance Due  Topic Date  Due   FOOT EXAM  Never done   OPHTHALMOLOGY EXAM  Never done   Colonoscopy  Never done   MAMMOGRAM  Never done   INFLUENZA VACCINE  05/05/2023 (Originally 09/05/2022)   HEMOGLOBIN A1C  05/25/2023      10/28/2022    1:49 PM 11/01/2022    3:05 PM 12/24/2022   10:28 AM 12/31/2022    8:49 AM 04/03/2023    2:19 PM  Fall Risk  Falls in the past year? 1 0 0 1 0  Was there an injury with Fall? 1  0 0 0  Fall Risk Category Calculator 3  0 1 0  Patient at Risk for Falls Due to History of fall(s)   History of fall(s) Impaired balance/gait;Impaired mobility  Fall risk Follow up Falls evaluation completed   Falls evaluation completed Falls evaluation completed   Functional Status Survey:    Vitals:   04/03/23 1051  BP: 114/80  Pulse: (!) 128  Resp: (!) 21  Temp: 97.9 F (36.6 C)  SpO2: 100%  Weight: 184 lb (83.5 kg)  Height: 5\' 2"  (1.575 m)   Body mass index is 33.65 kg/m. Physical Exam Vitals reviewed.  Constitutional:      General: She is not in acute distress.    Appearance: Normal appearance. She is obese. She is not ill-appearing or  diaphoretic.  HENT:     Head: Normocephalic.     Right Ear: Tympanic membrane, ear canal and external ear normal. There is no impacted cerumen.     Left Ear: Tympanic membrane, ear canal and external ear normal. There is no impacted cerumen.     Nose: Nose normal. No congestion or rhinorrhea.     Mouth/Throat:     Mouth: Mucous membranes are moist.     Pharynx: Oropharynx is clear. No oropharyngeal exudate or posterior oropharyngeal erythema.  Eyes:     General: No scleral icterus.       Right eye: No discharge.        Left eye: No discharge.     Extraocular Movements: Extraocular movements intact.     Conjunctiva/sclera: Conjunctivae normal.     Pupils: Pupils are equal, round, and reactive to light.  Neck:     Vascular: No carotid bruit.  Cardiovascular:     Rate and Rhythm: Normal rate and regular rhythm.     Pulses: Normal pulses.     Heart sounds: Normal heart sounds. No murmur heard.    No friction rub. No gallop.  Pulmonary:     Effort: Pulmonary effort is normal. No respiratory distress.     Breath sounds: Normal breath sounds. No wheezing, rhonchi or rales.  Chest:     Chest wall: No tenderness.  Abdominal:     General: Bowel sounds are normal. There is no distension.     Palpations: Abdomen is soft. There is no mass.     Tenderness: There is no abdominal tenderness. There is no right CVA tenderness, left CVA tenderness, guarding or rebound.  Musculoskeletal:        General: No swelling or tenderness. Normal range of motion.     Cervical back: Normal range of motion. No rigidity or tenderness.     Right lower leg: No edema.     Left lower leg: No edema.  Lymphadenopathy:     Cervical: No cervical adenopathy.  Skin:    General: Skin is warm and dry.     Coloration: Skin is not pale.  Findings: No bruising, erythema, lesion or rash.  Neurological:     Mental Status: She is alert and oriented to person, place, and time.     Cranial Nerves: No cranial nerve  deficit.     Sensory: No sensory deficit.     Motor: Weakness present.     Coordination: Coordination normal.     Gait: Gait abnormal.     Comments: Left hand contracture  Psychiatric:        Mood and Affect: Mood normal.        Speech: Speech normal.        Behavior: Behavior normal.        Thought Content: Thought content normal.        Judgment: Judgment normal.     Labs reviewed: Recent Labs    11/25/22 0520 11/26/22 0508 12/02/22 0530 12/05/22 0500 01/02/23 0440 01/03/23 1104 02/10/23 0528 02/10/23 0529 02/11/23 0654 02/12/23 0703 04/03/23 1505  NA 139   < > 136   < > 130*   < >  --    < > 138 137 135  K 4.3   < > 4.0   < > 3.3*   < >  --    < > 4.1 3.8 4.8  CL 106   < > 105   < > 97*   < >  --    < > 106 104 99  CO2 22   < > 23   < > 20*   < >  --    < > 22 22 23   GLUCOSE 185*   < > 196*   < > 235*   < >  --    < > 104* 220* 409*  BUN 14   < > 20   < > 8   < >  --    < > 9 12 20   CREATININE 1.16*   < > 0.94   < > 0.77   < >  --    < > 0.80 0.88 0.78  CALCIUM 9.2   < > 8.9   < > 8.5*   < >  --    < > 8.9 9.3 9.5  MG 2.1  --  1.7  --  1.7   < > 2.0  --  2.0  --  1.6  PHOS 3.4  --  4.1  --  3.3  --   --   --   --   --   --    < > = values in this interval not displayed.   Recent Labs    02/08/23 0435 02/09/23 1211 02/10/23 0529  AST 20 17 15   ALT 14 15 16   ALKPHOS 51 49 55  BILITOT 0.8 0.4 0.5  PROT 6.4* 6.6 6.5  ALBUMIN 3.0* 2.8* 2.6*   Recent Labs    02/05/23 0744 02/06/23 0757 02/08/23 0435 02/11/23 0654 02/12/23 0703 04/03/23 1505  WBC 6.5 5.4   < > 6.8 6.0 7.6  NEUTROABS 3.4 2.8  --   --   --  4,241  HGB 9.0* 8.3*   < > 10.6* 10.5* 12.8  HCT 28.7* 26.9*   < > 32.4* 31.9* 40.8  MCV 76.7* 77.5*   < > 77.1* 77.2* 82.4  PLT 278 294   < > 384 419* 366   < > = values in this interval not displayed.   Lab Results  Component Value Date   TSH 1.441 01/22/2023   Lab  Results  Component Value Date   HGBA1C 8.5 (H) 11/24/2022   Lab Results   Component Value Date   CHOL 132 10/07/2022   HDL 35 (L) 10/07/2022   LDLCALC 66 10/07/2022   TRIG 157 (H) 10/07/2022   CHOLHDL 3.8 10/07/2022    Significant Diagnostic Results in last 30 days:  No results found.  Assessment/Plan  Gastrointestinal Bleeding Status post hospitalization for gastrointestinal bleeding from February 04, 2023, to February 05, 2023. Presented with coffee ground emesis and hematemesis. Hemoglobin dropped to 8.3 and stabilized to 10.5 after transfusion. EGD showed LA grade C esophagitis, single gastric polyp, and single duodenal polyp. Likely source of bleed was gastritis. Currently no further episodes of vomiting. Risks of recurrent bleeding and need for ongoing monitoring discussed. Benefits of PPI therapy and potential need for future endoscopic evaluation explained. - Continue Protonix 80 mg IV push - Continue PPI twice daily for six weeks - Continue Reglan - Recheck CBC for anemia  Hyperthyroidism Hyperthyroidism with associated sinus tachycardia. Currently on Tapazole and metoprolol. Heart rate elevated to 120-128 bpm, likely due to missed doses. Discussed importance of medication adherence to manage heart rate and thyroid function. - Ensure daily intake of metoprolol and methimazole - Monitor heart rate - Check thyroid function tests  Type 2 Diabetes Mellitus Type 2 diabetes with recent hemoglobin A1c of 8.5. Blood glucose levels managed with Lantus and Humalog on a sliding scale. Current Lantus dose is 14 units. Discussed importance of blood glucose monitoring and adherence to insulin regimen. Provided sliding scale for home use. - Continue Lantus 14 units daily - Continue Humalog sliding scale - Monitor blood glucose levels  Coronary Artery Disease (CAD) CAD, status post coverage. Ranexa was held during hospitalization due to soft blood pressure and resumed on discharge. Discussed importance of medication adherence and monitoring for any chest pain or  symptoms of angina. - Continue Ranexa 500 mg twice daily  Hyperlipidemia Hyperlipidemia, statin therapy resumed on discharge. Discussed importance of medication adherence and monitoring lipid levels. - Continue atorvastatin 80 mg daily  Obstructive Sleep Apnea Obstructive sleep apnea, CPAP therapy resumed on discharge. Discussed importance of CPAP adherence to manage symptoms and improve sleep quality. - Continue CPAP therapy  Asthma  - symptoms controlled  - continue on Albuterol   Major Depressive Disorder Major depressive disorder, currently managed with Lexapro 20 mg daily. Discussed importance of medication adherence and monitoring for any changes in mood or behavior. - Continue Lexapro 20 mg daily  COVID-19 Infection  Incidentally diagnosed with COVID-19 during hospitalization. Completed a five-day course of Paxlovid. No current symptoms. Discussed monitoring for any new symptoms and potential long-term effects. - Resolved  - Monitor for any new symptoms of COVID-19  Epistaxis Episodic nasal bleeding, evaluated by ENT. No active bleeding noted. Discussed monitoring for any new episodes and potential need for further ENT evaluation. - Monitor for any new episodes of nasal bleeding  Constipation Complained of constipation prior to discharge, managed with Miralax and Senokot. Discussed importance of hydration and dietary fiber  to manage symptoms. - Continue Senokot twice daily as needed  General Health Maintenance General health maintenance discussed including the importance of physical therapy and hydration. Discussed use of Aveeno anti-itch lotion for itching. - Continue physical therapy - Ensure adequate hydration - Use Aveeno anti-itch lotion for itching  Follow-up - Follow-up appointment on April 23, 2023, at 10:40 AM - Order recheck of magnesium level, kidney function, and electrolytes - Order CBC to monitor anemia -  Arrange for a nurse to assist with medication  management - Confirm physical therapy and nursing service details.   Family/ staff Communication: Reviewed plan of care with patient verbalized understanding   Labs/tests ordered: None   Next Appointment: Return if symptoms worsen or fail to improve.   Total time: 38 minutes. Greater than 50% of total time spent doing patient education regarding chronic medical conditions post SNF/Hospital discharge,health maintenance including symptom/medication management.   Caesar Bookman, NP

## 2023-04-07 ENCOUNTER — Other Ambulatory Visit: Payer: Self-pay | Admitting: Family

## 2023-04-07 DIAGNOSIS — E0590 Thyrotoxicosis, unspecified without thyrotoxic crisis or storm: Secondary | ICD-10-CM

## 2023-04-07 NOTE — Telephone Encounter (Signed)
 Left message on voicemail for patient to return call when available, reason for call:   1.) Plavix last filled at [facility] in December 2024, # 90/3 refills (need to clarify which pharmacy patient is using as current request is from [place])  2.) Xarelto last filled with [facility], not due for additional refills until May 2025  3.) Exetimibe last filled with [facility] pharmacy in December 2024, #90/3 refills  4.) Atorvastatin last filled with [facility] on 03/31/23, #30/10 refills  5.) Methimazole last filled November 2024, #90/1 with [facility], not due until May 2025

## 2023-04-09 ENCOUNTER — Other Ambulatory Visit: Payer: Self-pay

## 2023-04-09 ENCOUNTER — Emergency Department (HOSPITAL_COMMUNITY)
Admission: EM | Admit: 2023-04-09 | Discharge: 2023-04-09 | Disposition: A | Discharge status: Home / Self Care | Attending: Emergency Medicine | Admitting: Emergency Medicine

## 2023-04-09 ENCOUNTER — Emergency Department (HOSPITAL_COMMUNITY)

## 2023-04-09 ENCOUNTER — Encounter (HOSPITAL_COMMUNITY): Payer: Self-pay

## 2023-04-09 DIAGNOSIS — I69354 Hemiplegia and hemiparesis following cerebral infarction affecting left non-dominant side: Secondary | ICD-10-CM | POA: Diagnosis not present

## 2023-04-09 DIAGNOSIS — I509 Heart failure, unspecified: Secondary | ICD-10-CM | POA: Insufficient documentation

## 2023-04-09 DIAGNOSIS — R404 Transient alteration of awareness: Secondary | ICD-10-CM | POA: Diagnosis not present

## 2023-04-09 DIAGNOSIS — E875 Hyperkalemia: Secondary | ICD-10-CM | POA: Insufficient documentation

## 2023-04-09 DIAGNOSIS — E860 Dehydration: Secondary | ICD-10-CM | POA: Diagnosis not present

## 2023-04-09 DIAGNOSIS — Z7401 Bed confinement status: Secondary | ICD-10-CM | POA: Diagnosis not present

## 2023-04-09 DIAGNOSIS — Z794 Long term (current) use of insulin: Secondary | ICD-10-CM | POA: Diagnosis not present

## 2023-04-09 DIAGNOSIS — R739 Hyperglycemia, unspecified: Secondary | ICD-10-CM | POA: Diagnosis not present

## 2023-04-09 DIAGNOSIS — I2510 Atherosclerotic heart disease of native coronary artery without angina pectoris: Secondary | ICD-10-CM | POA: Insufficient documentation

## 2023-04-09 DIAGNOSIS — N179 Acute kidney failure, unspecified: Secondary | ICD-10-CM

## 2023-04-09 DIAGNOSIS — R531 Weakness: Secondary | ICD-10-CM | POA: Diagnosis not present

## 2023-04-09 DIAGNOSIS — E1165 Type 2 diabetes mellitus with hyperglycemia: Secondary | ICD-10-CM | POA: Diagnosis not present

## 2023-04-09 DIAGNOSIS — J449 Chronic obstructive pulmonary disease, unspecified: Secondary | ICD-10-CM | POA: Diagnosis not present

## 2023-04-09 DIAGNOSIS — E162 Hypoglycemia, unspecified: Secondary | ICD-10-CM | POA: Diagnosis present

## 2023-04-09 DIAGNOSIS — E108 Type 1 diabetes mellitus with unspecified complications: Secondary | ICD-10-CM | POA: Diagnosis not present

## 2023-04-09 DIAGNOSIS — Z7951 Long term (current) use of inhaled steroids: Secondary | ICD-10-CM | POA: Insufficient documentation

## 2023-04-09 DIAGNOSIS — Z743 Need for continuous supervision: Secondary | ICD-10-CM | POA: Diagnosis not present

## 2023-04-09 DIAGNOSIS — Z7902 Long term (current) use of antithrombotics/antiplatelets: Secondary | ICD-10-CM | POA: Insufficient documentation

## 2023-04-09 DIAGNOSIS — I517 Cardiomegaly: Secondary | ICD-10-CM | POA: Diagnosis not present

## 2023-04-09 DIAGNOSIS — E11649 Type 2 diabetes mellitus with hypoglycemia without coma: Secondary | ICD-10-CM | POA: Insufficient documentation

## 2023-04-09 DIAGNOSIS — Z7901 Long term (current) use of anticoagulants: Secondary | ICD-10-CM | POA: Insufficient documentation

## 2023-04-09 DIAGNOSIS — I10 Essential (primary) hypertension: Secondary | ICD-10-CM | POA: Diagnosis not present

## 2023-04-09 LAB — URINALYSIS, ROUTINE W REFLEX MICROSCOPIC
Bilirubin Urine: NEGATIVE
Glucose, UA: NEGATIVE mg/dL
Hgb urine dipstick: NEGATIVE
Ketones, ur: NEGATIVE mg/dL
Leukocytes,Ua: NEGATIVE
Nitrite: NEGATIVE
Protein, ur: NEGATIVE mg/dL
Specific Gravity, Urine: 1.009 (ref 1.005–1.030)
pH: 5 (ref 5.0–8.0)

## 2023-04-09 LAB — CBG MONITORING, ED
Glucose-Capillary: 279 mg/dL — ABNORMAL HIGH (ref 70–99)
Glucose-Capillary: 245 mg/dL — ABNORMAL HIGH (ref 70–99)

## 2023-04-09 LAB — BASIC METABOLIC PANEL
Anion gap: 14 (ref 5–15)
BUN: 36 mg/dL — ABNORMAL HIGH (ref 6–20)
CO2: 20 mmol/L — ABNORMAL LOW (ref 22–32)
Calcium: 9.4 mg/dL (ref 8.9–10.3)
Chloride: 98 mmol/L (ref 98–111)
Creatinine, Ser: 1.72 mg/dL — ABNORMAL HIGH (ref 0.44–1.00)
GFR, Estimated: 36 mL/min — ABNORMAL LOW (ref >60)
Glucose, Bld: 301 mg/dL — ABNORMAL HIGH (ref 70–99)
Potassium: 5.2 mmol/L — ABNORMAL HIGH (ref 3.5–5.1)
Sodium: 132 mmol/L — ABNORMAL LOW (ref 135–145)

## 2023-04-09 LAB — CBC
HCT: 40.6 % (ref 36.0–46.0)
Hemoglobin: 13 g/dL (ref 12.0–15.0)
MCH: 25.9 pg — ABNORMAL LOW (ref 26.0–34.0)
MCHC: 32 g/dL (ref 30.0–36.0)
MCV: 80.9 fL (ref 80.0–100.0)
Platelets: 364 10*3/uL (ref 150–400)
RBC: 5.02 MIL/uL (ref 3.87–5.11)
RDW: 19.1 % — ABNORMAL HIGH (ref 11.5–15.5)
WBC: 11.4 10*3/uL — ABNORMAL HIGH (ref 4.0–10.5)
nRBC: 0 % (ref 0.0–0.2)

## 2023-04-09 LAB — RESP PANEL BY RT-PCR (RSV, FLU A&B, COVID)  RVPGX2
Influenza A by PCR: NEGATIVE
Influenza B by PCR: NEGATIVE
Resp Syncytial Virus by PCR: NEGATIVE
SARS Coronavirus 2 by RT PCR: NEGATIVE

## 2023-04-09 MED ORDER — LACTATED RINGERS IV BOLUS
1000.0000 mL | Freq: Once | INTRAVENOUS | Status: AC
  Administered 2023-04-09: 1000 mL via INTRAVENOUS

## 2023-04-09 MED ORDER — SODIUM CHLORIDE 0.9 % IV BOLUS
1000.0000 mL | Freq: Once | INTRAVENOUS | Status: AC
  Administered 2023-04-09: 1000 mL via INTRAVENOUS

## 2023-04-09 NOTE — Discharge Instructions (Signed)
 Thank you for letting us evaluate you today.  Your kidney function was mildly elevated and likely due to dehydration.  Your sugar was mildly elevated here as well.  We fix both with fluids.  Please make sure to check sugars at home and take prescription medication as prescribed.

## 2023-04-09 NOTE — ED Triage Notes (Signed)
 Pt BIB EMS from Home due to Hyperglycemia. Pt family reports hyperglycemia for the last 2 days. Baseline Hx of Stroke, bed bound, speech difficulties.  In Route CBG 321 BP 96/64 SpO2 99%RA HR 100

## 2023-04-09 NOTE — ED Provider Notes (Signed)
 Received patient at signout from previous provider pending completion of ED workup.  See his note.  In short, patient presents to emergency department for evaluation of hyperglycemia for past two days.  She is wheelchair dependent, mostly nonverbal but can occasionally speak a couple words at a time, and lives at home with her husband.  He manages her medicine.  ED workup for AKI (creatinine 1.72 with baseline WNL), mild hyperkalemia 5.2, hyperglycemia without acidosis nor anion gap. Treated with IVF x 2.  Physical exam is limited and difficult due to to past medical history of stroke with multiple deficits.  Patient occasionally can follow commands and answer questions.  No bed sores noted on patient.  Pupils PERRLA  Patient's husband was attempted to be contacted however he will not answer his phone.  However, he did come into ED and visit patient.  He reports that patient is at her baseline and looks significantly better since ED visit.  He endorses that she typically acts this way when her sugar is high.  Initially, patient was mostly nonverbal with EDP, following husband arriving, she is able to speak more words at a time.  She states "I am fine" and repeats yes multiple times when asked if she is ready to go home.  He endorses that she is at her baseline.  I had extensive discussion with patient's husband regarding ED workup, disposition and shared decision making is had that he is agreeable to bringing patient home.  He does not feel that any further workup is required as she is significantly improved.  At this time, I do not see any emergent cause to keep patient here or for admission.  I discussed return to emergency department cautions with patient's husband who expresses understanding present plan.  All questions answered to his satisfaction.  He is agreeable discharge.   Judithann Sheen, PA 04/09/23 1610    Gwyneth Sprout, MD 04/09/23 2236

## 2023-04-09 NOTE — ED Provider Notes (Signed)
 [place] EMERGENCY DEPARTMENT AT [facility] Provider Note   CSN: [id number] Arrival date & time: 04/09/23  1310     History  No chief complaint on file.   Lori [name] is a 53 y.o. female.  The history is provided by the EMS personnel and medical records. No language interpreter was used.     53 year old female with multiple comorbidities which includes prior stroke with left hemiplegia, CHF, COPD, insulin-dependent diabetes, depression, generalized weakness, recurrent DKA, wheelchair dependence, mostly nonverbal brought from home via EMS for concerns of hypoglycemia.  EMS report patient was noted to be hypoglycemic despite taking her usual home education.  Initial CBG was 300.  No other complaint was reported.  Patient is bedbound and mostly nonverbal.  No report of any sickness.  States she is compliant with her medication.  Additional history obtained through husband.  Home Medications Prior to Admission medications   Medication Sig Start Date End Date Taking? Authorizing Provider  acetaminophen (TYLENOL) 325 MG tablet Take 2 tablets (650 mg total) by mouth every 6 (six) hours as needed for mild pain (pain score 1-3) (or Fever >/= 101). 02/12/23   Rodolph Bong, MD  albuterol (PROVENTIL) (2.5 MG/3ML) 0.083% nebulizer solution Take 3 mLs (2.5 mg total) by nebulization every 6 (six) hours as needed for wheezing or shortness of breath. 01/01/23   Ngetich, Dinah C, NP  albuterol (VENTOLIN HFA) 108 (90 Base) MCG/ACT inhaler INHALE 2 PUFFS BY MOUTH EVERY 6 HOURS AS NEEDED FOR WHEEZING AND FOR SHORTNESS OF BREATH Strength: 108 (90 Base) MCG/ACT Patient taking differently: Inhale 2 puffs into the lungs every 6 (six) hours as needed for shortness of breath. 01/01/23   Ngetich, Dinah C, NP  atorvastatin (LIPITOR) 80 MG tablet TAKE 1 TABLET (80 MG TOTAL) BY MOUTH DAILY. 03/31/23   Ngetich, Dinah C, NP  baclofen (LIORESAL) 10 MG tablet Take 1 tablet (10 mg  total) by mouth 3 (three) times daily. 01/08/23   Ngetich, Dinah C, NP  clopidogrel (PLAVIX) 75 MG tablet TAKE 1 TABLET BY MOUTH DAILY 04/09/23   Ngetich, Donalee Citrin, NP  Continuous Glucose Receiver (FREESTYLE LIBRE 2 READER) DEVI 1 Device by Does not apply route daily. E11.69 11/01/22   Ngetich, Dinah C, NP  Continuous Glucose Sensor (FREESTYLE LIBRE 14 DAY SENSOR) MISC 1 each by Does not apply route as needed. DX: E11.69 12/24/22   Ngetich, Dinah C, NP  cyclobenzaprine (FLEXERIL) 5 MG tablet TAKE 1 TABLET BY MOUTH 3 TIMES DAILY AS NEEDED FOR MUSCLE SPASMS Patient taking differently: Take 5 mg by mouth at bedtime. 11/18/22   Lovorn, Aundra Millet, MD  diclofenac Sodium (VOLTAREN) 1 % GEL Apply 2 g topically 4 (four) times daily.    [provider]  escitalopram (LEXAPRO) 20 MG tablet Take 1 tablet (20 mg total) by mouth every morning. 01/01/23   Ngetich, Dinah C, NP  ezetimibe (ZETIA) 10 MG tablet TAKE 1 TABLET BY MOUTH DAILY 04/09/23   Ngetich, Dinah C, NP  fluticasone (FLONASE) 50 MCG/ACT nasal spray Place 2 sprays into both nostrils daily for 7 days. 02/12/23 02/19/23  Rodolph Bong, MD  gabapentin (NEURONTIN) 100 MG capsule Take 100 mg by mouth 3 (three) times daily. 11/14/22   [provider]  guaiFENesin (ROBITUSSIN) 100 MG/5ML liquid Take 5 mLs by mouth every 4 (four) hours as needed for cough or to loosen phlegm. 02/13/23   Rodolph Bong, MD  insulin glargine (LANTUS SOLOSTAR) 100 UNIT/ML Solostar Pen  Inject 14 Units into the skin at bedtime. 02/13/23   Rodolph Bong, MD  insulin lispro (HUMALOG) 100 UNIT/ML injection Sliding scale AC, HS CBG 121 - 150: 2 units  CBG 151 - 200: 3 units  CBG 201 - 250: 5 units  CBG 251 - 300: 8 units  CBG 301 - 350: 11 units  CBG 351 - 400: 15 units  CBG > 400: call MD 12/06/22   Jerald Kief, MD  loratadine (CLARITIN) 10 MG tablet Take 1 tablet (10 mg total) by mouth daily. 02/12/23   Rodolph Bong, MD  LORazepam (ATIVAN) 0.5 MG tablet  Take 1 tablet (0.5 mg total) by mouth at bedtime. 02/12/23   Rodolph Bong, MD  methimazole (TAPAZOLE) 5 MG tablet TAKE 1 TABLET BY MOUTH EVERY  MORNING 04/09/23   Ngetich, Dinah C, NP  metoCLOPramide (REGLAN) 5 MG tablet Take 1 tablet (5 mg total) by mouth 3 (three) times daily before meals. 01/04/23   Danford, Earl Lites, MD  metoprolol tartrate (LOPRESSOR) 100 MG tablet Take 1 tablet (100 mg total) by mouth 2 (two) times daily. Hold for SBP < 110 02/12/23   Rodolph Bong, MD  oxymetazoline (AFRIN) 0.05 % nasal spray Place 1 spray into both nostrils 2 (two) times daily as needed for congestion (nasal bleeding). 02/12/23   Rodolph Bong, MD  pantoprazole (PROTONIX) 40 MG tablet Take 1 tablet (40 mg total) by mouth 2 (two) times daily for 42 days, THEN 1 tablet (40 mg total) daily. 02/12/23 03/25/24  Rodolph Bong, MD  psyllium (METAMUCIL) 58.6 % powder Take 1 packet by mouth daily.    [provider]  ranolazine (RANEXA) 500 MG 12 hr tablet Take 1 tablet (500 mg total) by mouth 2 (two) times daily. 10/09/22 01/23/24  [place], Alvira Philips, DO  rivaroxaban (XARELTO) 10 MG TABS tablet TAKE 1 TABLET BY MOUTH DAILY 04/09/23   Ngetich, Dinah C, NP  senna-docusate (SENOKOT-S) 8.6-50 MG tablet Take 1 tablet by mouth 2 (two) times daily. 02/12/23   Rodolph Bong, MD  sorbitol 70 % SOLN Take 30 mLs by mouth daily as needed for moderate constipation. 02/12/23   Rodolph Bong, MD  tamsulosin (FLOMAX) 0.4 MG CAPS capsule Take 0.4 mg by mouth at bedtime. 11/04/22   [provider]  topiramate (TOPAMAX) 25 MG tablet Take 1 tablet (25 mg total) by mouth 2 (two) times daily. 07/10/22   Sherryll Burger, Pratik D, DO  zinc oxide (BALMEX) 11.3 % CREA cream Apply 1 Application topically 2 (two) times daily.    [provider]      Allergies    Asa [aspirin], Contrast media [iodinated contrast media], Imitrex [sumatriptan], Ms contin [morphine], Penicillins, Firvanq [vancomycin], Cipro  [ciprofloxacin hcl], Nitrofuran derivatives, and Wound dressing adhesive    Review of Systems   Review of Systems  Unable to perform ROS: Patient nonverbal    Physical Exam Updated Vital Signs BP 107/69 (BP Location: Right Arm)   Pulse 85   Temp 98.5 F (36.9 C) (Oral)   Resp 18   SpO2 99%  Physical Exam Vitals and nursing note reviewed.  Constitutional:      General: She is not in acute distress.    Appearance: She is well-developed.     Comments: Chronically ill and obese female laying in bed appears to be in no acute discomfort.  HENT:     Head: Atraumatic.  Eyes:     Conjunctiva/sclera: Conjunctivae normal.  Cardiovascular:     Rate and Rhythm: Normal rate and regular rhythm.     Pulses: Normal pulses.     Heart sounds: Normal heart sounds.  Pulmonary:     Effort: Pulmonary effort is normal.  Abdominal:     Palpations: Abdomen is soft.     Tenderness: There is no abdominal tenderness.  Genitourinary:    Comments: No pressure ulcer Musculoskeletal:     Cervical back: Normal range of motion and neck supple.  Skin:    Findings: No rash.  Neurological:     Mental Status: She is alert. Mental status is at baseline.     Comments: Global weakness, left hemiparesis  Psychiatric:        Mood and Affect: Mood normal.     ED Results / Procedures / Treatments   Labs (all labs ordered are listed, but only abnormal results are displayed) Labs Reviewed  BASIC METABOLIC PANEL - Abnormal; Notable for the following components:      Result Value   Sodium 132 (*)    Potassium 5.2 (*)    CO2 20 (*)    Glucose, Bld 301 (*)    BUN 36 (*)    Creatinine, Ser 1.72 (*)    GFR, Estimated 36 (*)    All other components within normal limits  CBC - Abnormal; Notable for the following components:   WBC 11.4 (*)    MCH 25.9 (*)    RDW 19.1 (*)    All other components within normal limits  CBG MONITORING, ED - Abnormal; Notable for the following components:   Glucose-Capillary  279 (*)    All other components within normal limits  RESP PANEL BY RT-PCR (RSV, FLU A&B, COVID)  [id number]  URINALYSIS, ROUTINE W REFLEX MICROSCOPIC    EKG EKG Interpretation Date/Time:  Wednesday April 09 2023 15:10:22 EST Ventricular Rate:  84 PR Interval:  176 QRS Duration:  87 QT Interval:  403 QTC Calculation: 477 R Axis:   60  Text Interpretation: Sinus rhythm Confirmed by Alvester Chou [phone]) on 04/09/2023 3:12:58 PM  Radiology No results found.  Procedures Procedures    Medications Ordered in ED Medications - No data to display  ED Course/ Medical Decision Making/ A&P                                 Medical Decision Making  BP 107/69 (BP Location: Right Arm)   Pulse 85   Temp 98.5 F (36.9 C) (Oral)   Resp 18   SpO2 99%   56:66 PM   53 year old female with multiple comorbidities which includes prior stroke with left hemiplegia, CHF, COPD, insulin-dependent diabetes, depression, generalized weakness, recurrent DKA, wheelchair dependence, mostly nonverbal brought from home via EMS for concerns of hypoglycemia.  EMS report patient was noted to be hypoglycemic despite taking her usual home education.  Initial CBG was 300.  No other complaint was reported.  Patient is bedbound and mostly nonverbal.  No report of any sickness.  States she is compliant with her medication.  Additional history obtained through husband.  Exam is limited as patient unable to answer much questions.  She is alert to self and to place.  She is without any complaint that I can appreciate.  Pt sign out to oncoming provider who will f/u on labs and reassessment.    -Labs ordered, independently viewed and interpreted by me.  Labs remarkable for CBG  279, K+ 5.2, Cr 1.72, WBC 11.4 -The patient was maintained on a cardiac monitor.  I personally viewed and interpreted the cardiac monitored which showed an underlying rhythm of: NSR -Imaging including CXR ordered -This patient presents to the ED  for concern of weak, this involves an extensive number of treatment options, and is a complaint that carries with it a high risk of complications and morbidity.  The differential diagnosis includes hyperglycemia, infection, electrolytes imbalance -Co morbidities that complicate the patient evaluation includes stroke, HTN, DKA, CAD -Treatment includes NS -Reevaluation of the patient after these medicines showed that the patient stayed the same -PCP office notes or outside notes reviewed -Discussion with oncoming team -Escalation to admission/observation considered: dispo pending         Final Clinical Impression(s) / ED Diagnoses Final diagnoses:  None    Rx / DC Orders ED Discharge Orders     None         Fayrene Helper, PA-C 04/09/23 1532    Terald Sleeper, MD 04/09/23 1540

## 2023-04-11 ENCOUNTER — Encounter: Payer: Self-pay | Admitting: Gastroenterology

## 2023-04-11 ENCOUNTER — Ambulatory Visit: Payer: 59 | Admitting: Gastroenterology

## 2023-04-11 VITALS — BP 132/86 | HR 87 | Ht 62.0 in | Wt 184.0 lb

## 2023-04-11 DIAGNOSIS — K5909 Other constipation: Secondary | ICD-10-CM | POA: Diagnosis not present

## 2023-04-11 DIAGNOSIS — Z7901 Long term (current) use of anticoagulants: Secondary | ICD-10-CM

## 2023-04-11 DIAGNOSIS — D509 Iron deficiency anemia, unspecified: Secondary | ICD-10-CM

## 2023-04-11 DIAGNOSIS — E1143 Type 2 diabetes mellitus with diabetic autonomic (poly)neuropathy: Secondary | ICD-10-CM | POA: Diagnosis not present

## 2023-04-11 DIAGNOSIS — K3184 Gastroparesis: Secondary | ICD-10-CM

## 2023-04-11 DIAGNOSIS — Z794 Long term (current) use of insulin: Secondary | ICD-10-CM | POA: Diagnosis not present

## 2023-04-11 DIAGNOSIS — K2101 Gastro-esophageal reflux disease with esophagitis, with bleeding: Secondary | ICD-10-CM

## 2023-04-11 MED ORDER — PANTOPRAZOLE SODIUM 40 MG PO TBEC: 40.0000 mg | DELAYED_RELEASE_TABLET | Freq: Two times a day (BID) | ORAL | 5 refills | Status: DC

## 2023-04-11 MED ORDER — SUCRALFATE 1 GM/10ML PO SUSP: 1.0000 g | Freq: Four times a day (QID) | ORAL | 1 refills | Status: DC

## 2023-04-11 MED ORDER — GIMOTI 15 MG/ACT NA SOLN: 1.0000 | Freq: Three times a day (TID) | NASAL | 1 refills | Status: DC

## 2023-04-11 MED ORDER — SUCRALFATE 1 GM/10ML PO SUSP
1.0000 g | Freq: Every day | ORAL | 1 refills | Status: DC
Start: 2023-04-11 — End: 2023-05-13

## 2023-04-11 MED ORDER — PRUCALOPRIDE SUCCINATE 2 MG PO TABS: 2.0000 mg | ORAL_TABLET | Freq: Every day | ORAL | 0 refills | Status: DC

## 2023-04-11 NOTE — Progress Notes (Signed)
 HPI :  53 y/o female here for follow up after hospitalization.  Patient with numerous comorbidities, diabetes, gastroparesis, GERD history of CAD, history of stroke, on Plavix and Xarelto.  She is bedbound, lives at home with husband.  Recall she was admitted to the hospital in December with transient encephalopathy, was found to have intracranial stenosis, neurology discontinued her amlodipine.  Return to the ED in early January with coffee-ground emesis, history of iron deficiency anemia.  She has had problems with GERD and symptoms of gastroparesis.  She was admitted to the hospital EGD performed on January 2.  She had LA grade C esophagitis.  Pyloric outlet was patent.  She had some gastric and duodenal polyps, not removed but duodenal polyp was sampled and benign.  Gastric polyps were not removed, she had recent anticoagulation.  Biopsies negative for H. pylori.  We increased her pantoprazole to 40 mg twice daily.  Placed her on Reglan for gastroparesis, which we she was not on previously.  She is here for follow-up today.  She states overall her upper tract symptoms are improved but they do persist to some extent.  Her Protonix was reduced to 40 mg once daily.  Her Reglan is being taken at 5 mg 3 times daily.  She states these measures have certainly helped and her symptoms are not as bad, but she continues to have some nocturnal pyrosis that bothers her.  She does have early satiety but she is not vomiting.  No further hematemesis.  She does have constipation that has been bothering her.  Taking MiraLAX daily and not really helping much.  She is moving her bowels every few days.  She remains on Plavix and Xarelto.  We had discussed in the hospital potentially repeating an endoscopy in a few months to confirm mucosal healing and rule out Barrett's, potentially remove gastric polyps and consideration for colonoscopy given her IDA.  She has never had a colonoscopy before.  Her hemoglobin has  been trending up since being placed on higher dose PPI, she is not on any iron supplementation.  Her hemoglobin has normalized.  Unfortunately she was seen in the ED for roughly 2 days just a few days ago, for elevated glucose levels and AKI.  We discussed if she could tolerate endoscopic procedures or not   Previous GI Evaluations    Nov 2024 Gastric emptying FINDINGS: Expected location of the stomach in the left upper quadrant. Ingested meal empties the stomach gradually over the course of the study with 67% retention at 60 min and 52% retention at 120 min and 24% retention at 240 minutes (normal retention less than 30% at a 120 min).   IMPRESSION: Delayed gastric emptying.  EGD 02/06/23: Esophagogastric landmarks were identified: the Z-line was found at 34 cm, the gastroesophageal junction was found at 34 cm and the upper extent of the gastric folds was found at 35 cm from the incisors. Findings: A 1 cm hiatal hernia was present. LA Grade C esophagitis with no bleeding was found 28 to 34 cm from the incisors. The exam of the esophagus was otherwise normal. A single 5 mm sessile polyp was found in the gastric body. Not removed given PLavix taken 2 days ago. There was some benign mottled appearance of the proximal stomach. The exam of the stomach was otherwise normal. Biopsies were taken with a cold forceps for Helicobacter pylori testing. A diverticulum was found in the second portion of the duodenum. A single 4 mm sessile polyp  was found in the second portion of the duodenum, not far away from normal appearing ampulla. Biopsies were taken with a cold forceps for histology to rule out adenomatous change. Positioning was quite challenging, only able to take a small biopsy / superficial. If removal is needed at some point, would be best to do with side viewing endoscope. The exam of the duodenum was otherwise normal.  No high risk pathology to cause bleeding, which is likely due to esophagitis in  the setting of nausea / vomiting. There is NO evidence of pathology or gastric outlet obstruction to cause her nausea / vomiting. I do think dx of gastroparesis is correct in the setting of DM. She should be on protonix 40mg  BID for 6 weeks and then once daily thereafter, if gastroparesis symptoms persist. Continue Reglan if that has helped. Consider transition to Gimoti as outpatient if covered by insurance and still having symptoms despite Reglan.  - Return patient to hospital ward for ongoing care. - Advance diet as tolerated. - Continue present medications. - Protonix 40mg  twice daily for 6 weeks and then once daily thereafter - Continue Reglan - Await pathology results. - Repeat EGD in a few months as outpatient, assess for mucosal healing, rule out Barrett's, and removal of gastric / duodenal polyps if needed - Resume Plavix / Xarelto tomorrow  FINAL MICROSCOPIC DIAGNOSIS:   A. 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 STOMACH, ANTRUM AND BODY, BIOPSY:  - Gastric antral and oxyntic mucosa with no specific histopathologic  changes  - Helicobacter pylori-like organisms are not identified on routine HE  stain     Past Medical History:  Diagnosis Date   CAD (coronary artery disease)    Depression    Diabetes mellitus without complication (HCC)    History of CT scan    History of mammogram    History of MRI    Hypertension    Pheochromocytoma    Reversible cerebrovascular vasoconstriction syndrome    Stroke [facility])    Thyroid disease      Past Surgical History:  Procedure Laterality Date   ABDOMINAL HYSTERECTOMY     BIOPSY  02/06/2023   Procedure: BIOPSY;  Surgeon: Benancio Deeds, MD;  Location: [facility] ENDOSCOPY;  Service: Gastroenterology;;   CESAREAN SECTION     3   CORONARY ARTERY BYPASS GRAFT     ESOPHAGOGASTRODUODENOSCOPY (EGD) WITH PROPOFOL N/A 02/06/2023   Procedure: ESOPHAGOGASTRODUODENOSCOPY  (EGD) WITH PROPOFOL;  Surgeon: Benancio Deeds, MD;  Location: [facility] ENDOSCOPY;  Service: Gastroenterology;  Laterality: N/A;   Right adrenal gland removal for pheochromocytoma Right    Family History  Problem Relation Age of Onset   Hypertension Mother    Diabetes Mother    Atrial fibrillation Mother    Hypertension Father    Heart attack Father    Dementia Father    Diabetes Brother    Brain cancer Brother    Asthma Daughter    Sickle cell anemia Son    Atrial fibrillation Maternal Uncle    Social History   Tobacco Use   Smoking status: Never   Smokeless tobacco: Never  Vaping Use   Vaping status: Never Used  Substance Use Topics   Alcohol use: Not Currently   Drug use: Never   Current Outpatient Medications  Medication Sig Dispense Refill   acetaminophen (TYLENOL) 325 MG tablet Take 2 tablets (650 mg total) by mouth every 6 (six) hours  as needed for mild pain (pain score 1-3) (or Fever >/= 101).     albuterol (PROVENTIL) (2.5 MG/3ML) 0.083% nebulizer solution Take 3 mLs (2.5 mg total) by nebulization every 6 (six) hours as needed for wheezing or shortness of breath. 75 mL 5   albuterol (VENTOLIN HFA) 108 (90 Base) MCG/ACT inhaler INHALE 2 PUFFS BY MOUTH EVERY 6 HOURS AS NEEDED FOR WHEEZING AND FOR SHORTNESS OF BREATH Strength: 108 (90 Base) MCG/ACT (Patient taking differently: Inhale 2 puffs into the lungs every 6 (six) hours as needed for shortness of breath.) 6.7 g 5   atorvastatin (LIPITOR) 80 MG tablet TAKE 1 TABLET (80 MG TOTAL) BY MOUTH DAILY. 30 tablet 10   baclofen (LIORESAL) 10 MG tablet Take 1 tablet (10 mg total) by mouth 3 (three) times daily. 90 tablet 1   clopidogrel (PLAVIX) 75 MG tablet TAKE 1 TABLET BY MOUTH DAILY 90 tablet 3   Continuous Glucose Receiver (FREESTYLE LIBRE 2 READER) DEVI 1 Device by Does not apply route daily. E11.69 1 each 11   Continuous Glucose Sensor (FREESTYLE LIBRE 14 DAY SENSOR) MISC 1 each by Does not apply route as needed. DX:  E11.69 2 each 12   cyclobenzaprine (FLEXERIL) 5 MG tablet TAKE 1 TABLET BY MOUTH 3 TIMES DAILY AS NEEDED FOR MUSCLE SPASMS (Patient taking differently: Take 5 mg by mouth at bedtime.) 90 tablet 10   diclofenac Sodium (VOLTAREN) 1 % GEL Apply 2 g topically 4 (four) times daily.     escitalopram (LEXAPRO) 20 MG tablet Take 1 tablet (20 mg total) by mouth every morning. 90 tablet 1   ezetimibe (ZETIA) 10 MG tablet TAKE 1 TABLET BY MOUTH DAILY 90 tablet 3   gabapentin (NEURONTIN) 100 MG capsule Take 100 mg by mouth 3 (three) times daily.     guaiFENesin (ROBITUSSIN) 100 MG/5ML liquid Take 5 mLs by mouth every 4 (four) hours as needed for cough or to loosen phlegm. 120 mL 0   insulin glargine (LANTUS SOLOSTAR) 100 UNIT/ML Solostar Pen Inject 14 Units into the skin at bedtime.     insulin lispro (HUMALOG) 100 UNIT/ML injection Sliding scale AC, HS CBG 121 - 150: 2 units  CBG 151 - 200: 3 units  CBG 201 - 250: 5 units  CBG 251 - 300: 8 units  CBG 301 - 350: 11 units  CBG 351 - 400: 15 units  CBG > 400: call MD     loratadine (CLARITIN) 10 MG tablet Take 1 tablet (10 mg total) by mouth daily. 30 tablet 0   LORazepam (ATIVAN) 0.5 MG tablet Take 1 tablet (0.5 mg total) by mouth at bedtime. 5 tablet 0   methimazole (TAPAZOLE) 5 MG tablet TAKE 1 TABLET BY MOUTH EVERY  MORNING 90 tablet 1   Metoclopramide HCl (GIMOTI) 15 MG/ACT SOLN Place 1 spray into the nose 3 (three) times daily. 9.8 mL 1   metoprolol tartrate (LOPRESSOR) 100 MG tablet Take 1 tablet (100 mg total) by mouth 2 (two) times daily. Hold for SBP < 110     oxymetazoline (AFRIN) 0.05 % nasal spray Place 1 spray into both nostrils 2 (two) times daily as needed for congestion (nasal bleeding). 30 mL 0   pantoprazole (PROTONIX) 40 MG tablet Take 1 tablet (40 mg total) by mouth 2 (two) times daily for 42 days, THEN 1 tablet (40 mg total) daily.     psyllium (METAMUCIL) 58.6 % powder Take 1 packet by mouth daily.     ranolazine (RANEXA)  500 MG 12  hr tablet Take 1 tablet (500 mg total) by mouth 2 (two) times daily. 180 tablet 0   rivaroxaban (XARELTO) 10 MG TABS tablet TAKE 1 TABLET BY MOUTH DAILY 90 tablet 1   senna-docusate (SENOKOT-S) 8.6-50 MG tablet Take 1 tablet by mouth 2 (two) times daily.     sorbitol 70 % SOLN Take 30 mLs by mouth daily as needed for moderate constipation.     sucralfate (CARAFATE) 1 GM/10ML suspension Take 10 mLs (1 g total) by mouth 4 (four) times daily. 420 mL 1   tamsulosin (FLOMAX) 0.4 MG CAPS capsule Take 0.4 mg by mouth at bedtime.     topiramate (TOPAMAX) 25 MG tablet Take 1 tablet (25 mg total) by mouth 2 (two) times daily. 10 tablet 0   zinc oxide (BALMEX) 11.3 % CREA cream Apply 1 Application topically 2 (two) times daily.     fluticasone (FLONASE) 50 MCG/ACT nasal spray Place 2 sprays into both nostrils daily for 7 days.     No current facility-administered medications for this visit.   Allergies  Allergen Reactions   Asa [Aspirin] Anaphylaxis, Swelling and Other (See Comments)    Throat closes   Contrast Media [Iodinated Contrast Media] Anaphylaxis and Swelling   Imitrex [Sumatriptan] Anaphylaxis   Ms Contin [Morphine] Anaphylaxis and Swelling   Penicillins Swelling and Other (See Comments)    Mouth and eyes swell   Firvanq [Vancomycin] Other (See Comments)    Red Man's Syndrome   Cipro [Ciprofloxacin Hcl] Nausea And Vomiting   Nitrofuran Derivatives Anxiety   Wound Dressing Adhesive Itching and Rash     Review of Systems: All systems reviewed and negative except where noted in HPI.   Lab Results  Component Value Date   WBC 11.4 (H) 04/09/2023   HGB 13.0 04/09/2023   HCT 40.6 04/09/2023   MCV 80.9 04/09/2023   PLT 364 04/09/2023       Latest Ref Rng & Units 04/09/2023    2:35 PM 04/03/2023    3:05 PM 02/12/2023    7:03 AM  CBC  WBC 4.0 - 10.5 K/uL 11.4  7.6  6.0   Hemoglobin 12.0 - 15.0 g/dL 81.1  91.4  78.2   Hematocrit 36.0 - 46.0 % 40.6  40.8  31.9   Platelets 150 - 400  K/uL 364  366  419     Lab Results  Component Value Date   IRON 25 (L) 10/15/2022   TIBC 252 10/15/2022   FERRITIN 84 10/15/2022    Lab Results  Component Value Date   NA 132 (L) 04/09/2023   CL 98 04/09/2023   K 5.2 (H) 04/09/2023   CO2 20 (L) 04/09/2023   BUN 36 (H) 04/09/2023   CREATININE 1.72 (H) 04/09/2023   GFRNONAA 36 (L) 04/09/2023   CALCIUM 9.4 04/09/2023   PHOS 3.3 01/02/2023   ALBUMIN 2.6 (L) 02/10/2023   GLUCOSE 301 (H) 04/09/2023    Lab Results  Component Value Date   ALT 16 02/10/2023   AST 15 02/10/2023   ALKPHOS 55 02/10/2023   BILITOT 0.5 02/10/2023     Physical Exam: BP 132/86   Pulse 87   Ht 5\' 2"  (1.575 m)   Wt 184 lb (83.5 kg)   BMI 33.65 kg/m  Constitutional: Pleasant,well-developed, female in no acute distress. Neurological: Alert and oriented to person place and time. Psychiatric: Normal mood and affect. Behavior is normal.   ASSESSMENT: 53 y.o. female here for assessment of  the following  1. Gastroesophageal reflux disease with esophagitis and hemorrhage   2. Iron deficiency anemia, unspecified iron deficiency anemia type   3. Diabetic gastroparesis (HCC)   4. Anticoagulated   5. Chronic constipation    She has gastroparesis causing significant reflux.  This culminated with erosive esophagitis causing hematemesis in the setting of anticoagulation back in January.  I think this is likely the cause of her IDA although she has never had a prior colonoscopy.  We escalated her PPI therapy and started her on low-dose Reglan.  Symptomatically she is definitely improved in regards to her reflux and early satiety although not resolved.  We discussed options.  She continues to have significant nocturnal pyrosis.  I would recommend we increase her Protonix back to 40 mg twice daily, and add liquid Carafate nightly to see if this will help reduce her nighttime symptoms.  She is agreeable with this.  She does appear to be improving on Reglan for  her gastroparesis.  We discussed risks of Reglan use, want to use lowest dose needed to control symptoms.  If she could get Gimoti covered, intranasal Reglan, this is often better tolerated with less risk and better efficacy.  Will see if her insurance will cover this and switch her from Reglan to Gimoti if possible.  We otherwise discussed her iron deficiency and erosive esophagitis and if she could tolerate repeat EGD to assess for mucosal healing, potentially remove stomach/duodenal polyps, and colonoscopy for screening and to evaluate IDA.  Her clinical course has been tenuous recently.  She just had a recent ED visit for AKI and hyperglycemia.  She does not think she is in a place where she could tolerate colonoscopy prep or colonoscopy right now.  I think this would be extremely difficult for her to do given her immobility and bedbound status.  We will repeat her labs in 6 weeks on the regimen to see if her iron deficiency/anemia remains controlled/resolved.  For now she wants to hold off on repeat EGD and colonoscopy.  I would like to see her back in 4 months or so for reassessment to discuss her course and if this is something she could proceed with.  We discussed if we pursue a colonoscopy she would need approval to hold her Plavix and Xarelto.  Will treat symptomatically for now.  For her constipation, MiraLAX not working well for her.  We had free samples of Motegrity in the office, we will give her 2 mg daily for 2 weeks and see if that will help her symptoms, if so we will see if insurance would approve prescription.  This may also help her gastroparesis   PLAN: - increase protonix to BID dosing - see if we can switch Reglan to Gimoti 15mg  TID (if covered by insurance) - add liquid carafate 10cc qHS - repeat CBC and TIBC / ferritin in 6 weeks - she does not think she can tolerate EGD and colonoscopy right now (just had recurrent ED visit with hyperglycemia and AKI) - follow up in a the  office in a few months to discuss this again - trial of motegrity if free samples - 2mg  / da for 2 weeks. Will give script if she finds it useful  Harlin Rain, MD [facility]

## 2023-04-11 NOTE — Patient Instructions (Addendum)
 We have sent the following medications to your pharmacy for you to pick up at your convenience: Gimoti Nasal spray:  Use 1 spray in one nostril three times daily 30 minutes before meals (this is in place of Reglan).  This medication will require a Prior Authorization from your insurance company. We will work on the PA and let you know the decision from your insurance company.  Please increase your Protonix to Twice a day  We have sent the following medications to your pharmacy for you to pick up at your convenience: Carafate suspension: Take 10 ml daily at bedtime  We have given you samples of the following medication to take: Motegrity: Take once a day.   Let us know if this is helpful and we can send a prescription.  We would like for you to go for lab work in 6 weeks (the week of April 21st). We will remind you when it is time to go.  Please follow up in 4 months.  Thank you for entrusting me with your care and for choosing [facility], Dr. Ileene Patrick     If your blood pressure at your visit was 140/90 or greater, please contact your primary care physician to follow up on this. ______________________________________________________  If you are age 51 or older, your body mass index should be between 23-30. Your Body mass index is 33.65 kg/m. If this is out of the aforementioned range listed, please consider follow up with your Primary Care Provider.  If you are age 18 or younger, your body mass index should be between 19-25. Your Body mass index is 33.65 kg/m. If this is out of the aformentioned range listed, please consider follow up with your Primary Care Provider.  ________________________________________________________  The  GI providers would like to encourage you to use [facility] to communicate with providers for non-urgent requests or questions.  Due to long hold times on the telephone, sending your provider a message by [facility] may be a faster and more  efficient way to get a response.  Please allow 48 business hours for a response.  Please remember that this is for non-urgent requests.  _______________________________________________________  Due to recent changes in healthcare laws, you may see the results of your imaging and laboratory studies on MyChart before your provider has had a chance to review them.  We understand that in some cases there may be results that are confusing or concerning to you. Not all laboratory results come back in the same time frame and the provider may be waiting for multiple results in order to interpret others.  Please give [place] 48 hours in order for your provider to thoroughly review all the results before contacting the office for clarification of your results.

## 2023-04-14 ENCOUNTER — Other Ambulatory Visit: Payer: Self-pay | Admitting: Family

## 2023-04-14 DIAGNOSIS — E0590 Thyrotoxicosis, unspecified without thyrotoxic crisis or storm: Secondary | ICD-10-CM

## 2023-04-18 ENCOUNTER — Other Ambulatory Visit: Payer: Self-pay

## 2023-04-18 MED ORDER — GIMOTI 15 MG/ACT NA SOLN: 1.0000 | Freq: Three times a day (TID) | NASAL | 1 refills | Status: DC

## 2023-04-19 DIAGNOSIS — Z794 Long term (current) use of insulin: Secondary | ICD-10-CM | POA: Diagnosis not present

## 2023-04-19 DIAGNOSIS — I2510 Atherosclerotic heart disease of native coronary artery without angina pectoris: Secondary | ICD-10-CM | POA: Diagnosis not present

## 2023-04-19 DIAGNOSIS — Z556 Problems related to health literacy: Secondary | ICD-10-CM | POA: Diagnosis not present

## 2023-04-19 DIAGNOSIS — Z7902 Long term (current) use of antithrombotics/antiplatelets: Secondary | ICD-10-CM | POA: Diagnosis not present

## 2023-04-19 DIAGNOSIS — E1169 Type 2 diabetes mellitus with other specified complication: Secondary | ICD-10-CM | POA: Diagnosis not present

## 2023-04-19 DIAGNOSIS — J4520 Mild intermittent asthma, uncomplicated: Secondary | ICD-10-CM | POA: Diagnosis not present

## 2023-04-19 DIAGNOSIS — N179 Acute kidney failure, unspecified: Secondary | ICD-10-CM | POA: Diagnosis not present

## 2023-04-19 DIAGNOSIS — E0590 Thyrotoxicosis, unspecified without thyrotoxic crisis or storm: Secondary | ICD-10-CM | POA: Diagnosis not present

## 2023-04-19 DIAGNOSIS — J4489 Other specified chronic obstructive pulmonary disease: Secondary | ICD-10-CM | POA: Diagnosis not present

## 2023-04-19 DIAGNOSIS — K5901 Slow transit constipation: Secondary | ICD-10-CM | POA: Diagnosis not present

## 2023-04-19 DIAGNOSIS — I509 Heart failure, unspecified: Secondary | ICD-10-CM | POA: Diagnosis not present

## 2023-04-19 DIAGNOSIS — K922 Gastrointestinal hemorrhage, unspecified: Secondary | ICD-10-CM | POA: Diagnosis not present

## 2023-04-19 DIAGNOSIS — E785 Hyperlipidemia, unspecified: Secondary | ICD-10-CM | POA: Diagnosis not present

## 2023-04-19 DIAGNOSIS — K219 Gastro-esophageal reflux disease without esophagitis: Secondary | ICD-10-CM | POA: Diagnosis not present

## 2023-04-19 DIAGNOSIS — G4733 Obstructive sleep apnea (adult) (pediatric): Secondary | ICD-10-CM | POA: Diagnosis not present

## 2023-04-19 DIAGNOSIS — I110 Hypertensive heart disease with heart failure: Secondary | ICD-10-CM | POA: Diagnosis not present

## 2023-04-19 DIAGNOSIS — Z7901 Long term (current) use of anticoagulants: Secondary | ICD-10-CM | POA: Diagnosis not present

## 2023-04-19 DIAGNOSIS — R040 Epistaxis: Secondary | ICD-10-CM | POA: Diagnosis not present

## 2023-04-19 DIAGNOSIS — R000 Tachycardia, unspecified: Secondary | ICD-10-CM | POA: Diagnosis not present

## 2023-04-19 DIAGNOSIS — Z8616 Personal history of COVID-19: Secondary | ICD-10-CM | POA: Diagnosis not present

## 2023-04-21 ENCOUNTER — Telehealth: Payer: Self-pay

## 2023-04-21 DIAGNOSIS — R3981 Functional urinary incontinence: Secondary | ICD-10-CM

## 2023-04-21 NOTE — Telephone Encounter (Signed)
 Noted.

## 2023-04-21 NOTE — Telephone Encounter (Signed)
 Large diapers ordered.Demetria,CMA to order on parachute.

## 2023-04-21 NOTE — Telephone Encounter (Signed)
 Copied from CRM [id number]. Topic: Clinical - Medical Advice >> Apr 21, 2023  2:38 PM Dennison Nancy wrote: Reason for CRM: Patient requesting to order size Large Diapers, patient use to wear size 2 x needing the size large now

## 2023-04-21 NOTE — Telephone Encounter (Signed)
Message routed to PCP Ngetich, Dinah C, NP  

## 2023-04-22 DIAGNOSIS — G4733 Obstructive sleep apnea (adult) (pediatric): Secondary | ICD-10-CM | POA: Diagnosis not present

## 2023-04-22 DIAGNOSIS — I2510 Atherosclerotic heart disease of native coronary artery without angina pectoris: Secondary | ICD-10-CM | POA: Diagnosis not present

## 2023-04-22 DIAGNOSIS — Z8616 Personal history of COVID-19: Secondary | ICD-10-CM | POA: Diagnosis not present

## 2023-04-22 DIAGNOSIS — Z556 Problems related to health literacy: Secondary | ICD-10-CM | POA: Diagnosis not present

## 2023-04-22 DIAGNOSIS — E0590 Thyrotoxicosis, unspecified without thyrotoxic crisis or storm: Secondary | ICD-10-CM | POA: Diagnosis not present

## 2023-04-22 DIAGNOSIS — I509 Heart failure, unspecified: Secondary | ICD-10-CM | POA: Diagnosis not present

## 2023-04-22 DIAGNOSIS — K922 Gastrointestinal hemorrhage, unspecified: Secondary | ICD-10-CM | POA: Diagnosis not present

## 2023-04-22 DIAGNOSIS — Z7902 Long term (current) use of antithrombotics/antiplatelets: Secondary | ICD-10-CM | POA: Diagnosis not present

## 2023-04-22 DIAGNOSIS — Z794 Long term (current) use of insulin: Secondary | ICD-10-CM | POA: Diagnosis not present

## 2023-04-22 DIAGNOSIS — R040 Epistaxis: Secondary | ICD-10-CM | POA: Diagnosis not present

## 2023-04-22 DIAGNOSIS — I110 Hypertensive heart disease with heart failure: Secondary | ICD-10-CM | POA: Diagnosis not present

## 2023-04-22 DIAGNOSIS — R000 Tachycardia, unspecified: Secondary | ICD-10-CM | POA: Diagnosis not present

## 2023-04-22 DIAGNOSIS — Z7901 Long term (current) use of anticoagulants: Secondary | ICD-10-CM | POA: Diagnosis not present

## 2023-04-22 DIAGNOSIS — N179 Acute kidney failure, unspecified: Secondary | ICD-10-CM | POA: Diagnosis not present

## 2023-04-22 DIAGNOSIS — J4520 Mild intermittent asthma, uncomplicated: Secondary | ICD-10-CM | POA: Diagnosis not present

## 2023-04-22 DIAGNOSIS — J4489 Other specified chronic obstructive pulmonary disease: Secondary | ICD-10-CM | POA: Diagnosis not present

## 2023-04-22 DIAGNOSIS — E1169 Type 2 diabetes mellitus with other specified complication: Secondary | ICD-10-CM | POA: Diagnosis not present

## 2023-04-22 DIAGNOSIS — K219 Gastro-esophageal reflux disease without esophagitis: Secondary | ICD-10-CM | POA: Diagnosis not present

## 2023-04-22 DIAGNOSIS — E785 Hyperlipidemia, unspecified: Secondary | ICD-10-CM | POA: Diagnosis not present

## 2023-04-22 DIAGNOSIS — K5901 Slow transit constipation: Secondary | ICD-10-CM | POA: Diagnosis not present

## 2023-04-23 ENCOUNTER — Ambulatory Visit: Payer: 59 | Admitting: Family

## 2023-04-23 NOTE — Progress Notes (Signed)
   This encounter was created in error - please disregard. No show

## 2023-04-23 NOTE — Progress Notes (Signed)
 Script was faxed to [facility] at [phone] on 3-14.  3-19 called [phone] to check on status of script. They had a discrepancy with the correct spelling of her name.  Confirmed correct spelling.  It was then processed and found to have a $0 copay. They will contact patient to schedule shipping her the medication.

## 2023-04-24 ENCOUNTER — Ambulatory Visit: Payer: 59 | Admitting: Endocrinology

## 2023-04-25 ENCOUNTER — Ambulatory Visit (INDEPENDENT_AMBULATORY_CARE_PROVIDER_SITE_OTHER): Admitting: Family

## 2023-04-25 ENCOUNTER — Encounter: Payer: Self-pay | Admitting: Family

## 2023-04-25 VITALS — BP 130/80 | HR 96 | Temp 97.6°F | Resp 19

## 2023-04-25 DIAGNOSIS — Z8673 Personal history of transient ischemic attack (TIA), and cerebral infarction without residual deficits: Secondary | ICD-10-CM

## 2023-04-25 DIAGNOSIS — R4701 Aphasia: Secondary | ICD-10-CM

## 2023-04-25 DIAGNOSIS — E1169 Type 2 diabetes mellitus with other specified complication: Secondary | ICD-10-CM | POA: Diagnosis not present

## 2023-04-25 DIAGNOSIS — J4520 Mild intermittent asthma, uncomplicated: Secondary | ICD-10-CM | POA: Diagnosis not present

## 2023-04-25 DIAGNOSIS — R2681 Unsteadiness on feet: Secondary | ICD-10-CM

## 2023-04-25 DIAGNOSIS — I10 Essential (primary) hypertension: Secondary | ICD-10-CM | POA: Diagnosis not present

## 2023-04-25 DIAGNOSIS — E785 Hyperlipidemia, unspecified: Secondary | ICD-10-CM | POA: Diagnosis not present

## 2023-04-25 DIAGNOSIS — E0590 Thyrotoxicosis, unspecified without thyrotoxic crisis or storm: Secondary | ICD-10-CM | POA: Diagnosis not present

## 2023-04-25 DIAGNOSIS — R569 Unspecified convulsions: Secondary | ICD-10-CM | POA: Insufficient documentation

## 2023-04-25 DIAGNOSIS — K219 Gastro-esophageal reflux disease without esophagitis: Secondary | ICD-10-CM

## 2023-04-25 DIAGNOSIS — F332 Major depressive disorder, recurrent severe without psychotic features: Secondary | ICD-10-CM

## 2023-04-25 NOTE — Patient Instructions (Signed)
 This is Psychologist, educational in [place]. Please call our office at [phone] to schedule an appointment.

## 2023-04-25 NOTE — Progress Notes (Signed)
 Provider: Richarda Blade FNP-C   Pattijo Juste, Donalee Citrin, NP  Patient Care Team: Diedre Maclellan, Donalee Citrin, NP as PCP - General (Family Medicine) Orbie Pyo, MD as PCP - Cardiology (Cardiology) [facility]  Extended Emergency Contact Information Primary Emergency Contact: [name],[name] Mobile Phone: [phone] Relation: Spouse Preferred language: English Interpreter needed? No Secondary Emergency Contact: [name],[name] Home Phone: [phone] Mobile Phone: [phone] Relation: Daughter Mother: [facility] Phone: [phone] Mobile Phone: [phone]  Code Status:  Full Code  Goals of care: Advanced Directive information    04/25/2023    1:22 PM  Advanced Directives  Does Patient Have a Medical Advance Directive? No  Would patient like information on creating a medical advance directive? No - Patient declined     Chief Complaint  Patient presents with   Medical Management of Chronic Issues    4 month follow up.      Discussed the use of AI scribe software for clinical note transcription with the patient, who gave verbal consent to proceed.  History of Present Illness   Lori [name] is a 53 year old female with diabetes and asthma who presents for a four-month follow-up visit.  She has been experiencing consistently high blood sugar levels, around 200 mg/dL, despite previous levels being in the 200s to 300s. She has not seen an endocrinologist for her diabetes management. No recent seizures have been reported. She has not used Ozempic due to previous nausea and pain episodes.  Her asthma symptoms, including wheezing and coughing, have improved. She denies any recent issues with heartburn or indigestion and reports no recent falls. She continues to use a nebulizer and albuterol solution for asthma management.  She mentions ongoing depression, expressing feelings of not healing. She continues to use a walker for mobility  and reports that physical therapy was not ordered, although a nurse from [facility] visits once a week. She has not seen a pulmonary doctor despite a previous referral for obstructive sleep apnea.on chart ,review specialist office called but patient did not answer phone and had no voicemail to leave message.Telephone given to return call.   She reports constipation and has used Colace with minimal relief. She has not taken any other medications for this issue recently. No urinary symptoms such as burning or itching are present. She mentions that her diapers are too large due to weight loss, and she now requires a smaller size.  She has a spider bite on her neck that is healing and is not itchy. She reports dry skin and itching all over her body, which she attributes to dry skin.Discussed lotioning assistance from her aide. No numbness or tingling in her legs and no swelling are reported.  She continues to take multiple medications, including Topamax for migraines, Ranexa, Flomax, gabapentin, Flexeril, Humalog, Voltaren gel, albuterol, Lexapro, baclofen, Metamucil, Flonase, Claritin, lorazepam, metoprolol, Afrin, Senokot, Lantus, atorvastatin, Protonix, Zetia, methimazole, Xarelto, and Reglan.    Past Medical History:  Diagnosis Date   CAD (coronary artery disease)    Depression    Diabetes mellitus without complication (HCC)    History of CT scan    History of mammogram    History of MRI    Hypertension    Pheochromocytoma    Reversible cerebrovascular vasoconstriction syndrome    Stroke [facility])    Thyroid disease    Past Surgical History:  Procedure Laterality Date   ABDOMINAL HYSTERECTOMY     BIOPSY  02/06/2023   Procedure: BIOPSY;  Surgeon: Ileene Patrick  P, MD;  Location: [facility] ENDOSCOPY;  Service: Gastroenterology;;   CESAREAN SECTION     3   CORONARY ARTERY BYPASS GRAFT     ESOPHAGOGASTRODUODENOSCOPY (EGD) WITH PROPOFOL N/A 02/06/2023   Procedure: ESOPHAGOGASTRODUODENOSCOPY (EGD) WITH  PROPOFOL;  Surgeon: Benancio Deeds, MD;  Location: [facility] ENDOSCOPY;  Service: Gastroenterology;  Laterality: N/A;   Right adrenal gland removal for pheochromocytoma Right     Allergies  Allergen Reactions   Asa [Aspirin] Anaphylaxis, Swelling and Other (See Comments)    Throat closes   Contrast Media [Iodinated Contrast Media] Anaphylaxis and Swelling   Imitrex [Sumatriptan] Anaphylaxis   Ms Contin [Morphine] Anaphylaxis and Swelling   Penicillins Swelling and Other (See Comments)    Mouth and eyes swell   Firvanq [Vancomycin] Other (See Comments)    Red Man's Syndrome   Cipro [Ciprofloxacin Hcl] Nausea And Vomiting   Nitrofuran Derivatives Anxiety   Wound Dressing Adhesive Itching and Rash    Allergies as of 04/25/2023       Reactions   Asa [aspirin] Anaphylaxis, Swelling, Other (See Comments)   Throat closes   Contrast Media [iodinated Contrast Media] Anaphylaxis, Swelling   Imitrex [sumatriptan] Anaphylaxis   Ms Contin [morphine] Anaphylaxis, Swelling   Penicillins Swelling, Other (See Comments)   Mouth and eyes swell   Firvanq [vancomycin] Other (See Comments)   Red Man's Syndrome   Cipro [ciprofloxacin Hcl] Nausea And Vomiting   Nitrofuran Derivatives Anxiety   Wound Dressing Adhesive Itching, Rash        Medication List        Accurate as of April 25, 2023  2:29 PM. If you have any questions, ask your nurse or doctor.          acetaminophen 325 MG tablet Commonly known as: TYLENOL Take 2 tablets (650 mg total) by mouth every 6 (six) hours as needed for mild pain (pain score 1-3) (or Fever >/= 101).   albuterol 108 (90 Base) MCG/ACT inhaler Commonly known as: VENTOLIN HFA INHALE 2 PUFFS BY MOUTH EVERY 6 HOURS AS NEEDED FOR WHEEZING AND FOR SHORTNESS OF BREATH Strength: 108 (90 Base) MCG/ACT What changed:  how much to take how to take this when to take this reasons to take this additional instructions   albuterol (2.5 MG/3ML) 0.083% nebulizer  solution Commonly known as: PROVENTIL Take 3 mLs (2.5 mg total) by nebulization every 6 (six) hours as needed for wheezing or shortness of breath. What changed: Another medication with the same name was changed. Make sure you understand how and when to take each.   atorvastatin 80 MG tablet Commonly known as: LIPITOR TAKE 1 TABLET (80 MG TOTAL) BY MOUTH DAILY.   baclofen 10 MG tablet Commonly known as: LIORESAL Take 1 tablet (10 mg total) by mouth 3 (three) times daily.   clopidogrel 75 MG tablet Commonly known as: PLAVIX TAKE 1 TABLET BY MOUTH DAILY   cyclobenzaprine 5 MG tablet Commonly known as: FLEXERIL TAKE 1 TABLET BY MOUTH 3 TIMES DAILY AS NEEDED FOR MUSCLE SPASMS What changed: when to take this   escitalopram 20 MG tablet Commonly known as: LEXAPRO Take 1 tablet (20 mg total) by mouth every morning.   ezetimibe 10 MG tablet Commonly known as: ZETIA TAKE 1 TABLET BY MOUTH DAILY   fluticasone 50 MCG/ACT nasal spray Commonly known as: FLONASE Place 2 sprays into both nostrils daily for 7 days.   FreeStyle Libre 14 Day Sensor Misc 1 each by Does not apply route as needed.  DX: E11.69   FreeStyle Libre 2 Reader Devi 1 Device by Does not apply route daily. E11.69   gabapentin 100 MG capsule Commonly known as: NEURONTIN Take 100 mg by mouth 3 (three) times daily.   Gimoti 15 MG/ACT Soln Generic drug: Metoclopramide HCl Place 1 spray into the nose 3 (three) times daily before meals. 30 minutes before meals   guaiFENesin 100 MG/5ML liquid Commonly known as: ROBITUSSIN Take 5 mLs by mouth every 4 (four) hours as needed for cough or to loosen phlegm.   insulin lispro 100 UNIT/ML injection Commonly known as: HUMALOG Sliding scale AC, HS CBG 121 - 150: 2 units  CBG 151 - 200: 3 units  CBG 201 - 250: 5 units  CBG 251 - 300: 8 units  CBG 301 - 350: 11 units  CBG 351 - 400: 15 units  CBG > 400: call MD   Lantus SoloStar 100 UNIT/ML Solostar Pen Generic drug:  insulin glargine Inject 14 Units into the skin at bedtime.   loratadine 10 MG tablet Commonly known as: CLARITIN Take 1 tablet (10 mg total) by mouth daily.   LORazepam 0.5 MG tablet Commonly known as: ATIVAN Take 1 tablet (0.5 mg total) by mouth at bedtime.   methimazole 5 MG tablet Commonly known as: TAPAZOLE TAKE 1 TABLET BY MOUTH EVERY  MORNING   metoprolol tartrate 100 MG tablet Commonly known as: LOPRESSOR Take 1 tablet (100 mg total) by mouth 2 (two) times daily. Hold for SBP < 110   oxymetazoline 0.05 % nasal spray Commonly known as: AFRIN Place 1 spray into both nostrils 2 (two) times daily as needed for congestion (nasal bleeding).   pantoprazole 40 MG tablet Commonly known as: PROTONIX Take 1 tablet (40 mg total) by mouth 2 (two) times daily.   Prucalopride Succinate 2 MG Tabs Commonly known as: Motegrity Take 1 tablet (2 mg total) by mouth daily. LOT: [id number], EXP: 11-2023   psyllium 58.6 % powder Commonly known as: METAMUCIL Take 1 packet by mouth daily.   ranolazine 500 MG 12 hr tablet Commonly known as: RANEXA Take 1 tablet (500 mg total) by mouth 2 (two) times daily.   senna-docusate 8.6-50 MG tablet Commonly known as: Senokot-S Take 1 tablet by mouth 2 (two) times daily.   sorbitol 70 % Soln Take 30 mLs by mouth daily as needed for moderate constipation.   sucralfate 1 GM/10ML suspension Commonly known as: CARAFATE Take 10 mLs (1 g total) by mouth at bedtime.   tamsulosin 0.4 MG Caps capsule Commonly known as: FLOMAX Take 0.4 mg by mouth at bedtime.   topiramate 25 MG tablet Commonly known as: TOPAMAX Take 1 tablet (25 mg total) by mouth 2 (two) times daily.   Voltaren 1 % Gel Generic drug: diclofenac Sodium Apply 2 g topically 4 (four) times daily.   Xarelto 10 MG Tabs tablet Generic drug: rivaroxaban TAKE 1 TABLET BY MOUTH DAILY   zinc oxide 11.3 % Crea cream Commonly known as: BALMEX Apply 1 Application topically 2 (two) times  daily.        Review of Systems  Immunization History  Administered Date(s) Administered   Influenza-Unspecified 11/04/2016   PPD Test 03/19/2016   Pfizer(Comirnaty)Fall Seasonal Vaccine 12 years and older 09/29/2019, 03/31/2020   Tdap 02/07/2014   Pertinent  Health Maintenance Due  Topic Date Due   FOOT EXAM  Never done   Colonoscopy  Never done   MAMMOGRAM  Never done   INFLUENZA VACCINE  05/05/2023 (Originally  09/05/2022)   HEMOGLOBIN A1C  05/25/2023   OPHTHALMOLOGY EXAM  02/19/2024      10/28/2022    1:49 PM 11/01/2022    3:05 PM 12/24/2022   10:28 AM 12/31/2022    8:49 AM 04/03/2023    2:19 PM  Fall Risk  Falls in the past year? 1 0 0 1 0  Was there an injury with Fall? 1  0 0 0  Fall Risk Category Calculator 3  0 1 0  Patient at Risk for Falls Due to History of fall(s)   History of fall(s) Impaired balance/gait;Impaired mobility  Fall risk Follow up Falls evaluation completed   Falls evaluation completed Falls evaluation completed   Functional Status Survey:    Vitals:   04/25/23 1333  BP: 130/80  Pulse: 96  Resp: 19  Temp: 97.6 F (36.4 C)  SpO2: 97%   There is no height or weight on file to calculate BMI. Physical Exam VITALS: T- 97.6, P- 96, BP- 130/80, SaO2- 97% GENERAL: Alert, cooperative, well developed, no acute distress. HEENT: Normocephalic, normal oropharynx, moist mucous membranes, ears normal without cerumen impaction, eyes normal, nose normal. CHEST: Clear to auscultation bilaterally, no wheezes, rhonchi, or crackles. CARDIOVASCULAR: Normal heart rate and rhythm, S1 and S2 normal without murmurs. ABDOMEN: Soft, non-tender, non-distended, without organomegaly, normal bowel sounds. EXTREMITIES: No cyanosis or edema. NEUROLOGICAL: Cranial nerves grossly intact, moves all extremities without gross motor or sensory deficit, sensation intact in feet. SKIN: Red area on neck, healing, no signs of infection, skin very dry.  PSYCHIATRY/BEHAVIORAL: mood  stable ,Forgetful   Labs reviewed: Recent Labs    11/25/22 0520 11/26/22 0508 12/02/22 0530 12/05/22 0500 01/02/23 0440 01/03/23 1104 02/10/23 0528 02/10/23 0529 02/11/23 0654 02/12/23 0703 04/03/23 1505 04/09/23 1435  NA 139   < > 136   < > 130*   < >  --    < > 138 137 135 132*  K 4.3   < > 4.0   < > 3.3*   < >  --    < > 4.1 3.8 4.8 5.2*  CL 106   < > 105   < > 97*   < >  --    < > 106 104 99 98  CO2 22   < > 23   < > 20*   < >  --    < > 22 22 23  20*  GLUCOSE 185*   < > 196*   < > 235*   < >  --    < > 104* 220* 409* 301*  BUN 14   < > 20   < > 8   < >  --    < > 9 12 20  36*  CREATININE 1.16*   < > 0.94   < > 0.77   < >  --    < > 0.80 0.88 0.78 1.72*  CALCIUM 9.2   < > 8.9   < > 8.5*   < >  --    < > 8.9 9.3 9.5 9.4  MG 2.1  --  1.7  --  1.7   < > 2.0  --  2.0  --  1.6  --   PHOS 3.4  --  4.1  --  3.3  --   --   --   --   --   --   --    < > = values in this interval not displayed.   Recent  Labs    02/08/23 0435 02/09/23 1211 02/10/23 0529  AST 20 17 15   ALT 14 15 16   ALKPHOS 51 49 55  BILITOT 0.8 0.4 0.5  PROT 6.4* 6.6 6.5  ALBUMIN 3.0* 2.8* 2.6*   Recent Labs    02/05/23 0744 02/06/23 0757 02/08/23 0435 02/12/23 0703 04/03/23 1505 04/09/23 1435  WBC 6.5 5.4   < > 6.0 7.6 11.4*  NEUTROABS 3.4 2.8  --   --  4,241  --   HGB 9.0* 8.3*   < > 10.5* 12.8 13.0  HCT 28.7* 26.9*   < > 31.9* 40.8 40.6  MCV 76.7* 77.5*   < > 77.2* 82.4 80.9  PLT 278 294   < > 419* 366 364   < > = values in this interval not displayed.   Lab Results  Component Value Date   TSH 1.441 01/22/2023   Lab Results  Component Value Date   HGBA1C 8.5 (H) 11/24/2022   Lab Results  Component Value Date   CHOL 132 10/07/2022   HDL 35 (L) 10/07/2022   LDLCALC 66 10/07/2022   TRIG 157 (H) 10/07/2022   CHOLHDL 3.8 10/07/2022    Significant Diagnostic Results in last 30 days:  DG Chest Portable 1 View Result Date: 04/09/2023 CLINICAL DATA:  Weakness EXAM: PORTABLE CHEST 1 VIEW  COMPARISON:  02/04/2023 FINDINGS: Post sternotomy changes. No acute airspace disease, pleural effusion or pneumothorax. Borderline to mild cardiomegaly. No pneumothorax IMPRESSION: No active disease.  Borderline cardiomegaly Electronically Signed   By: Jasmine Pang M.D.   On: 04/09/2023 18:45    Assessment/Plan  Diabetes Mellitus Blood glucose levels remain elevated at approximately 200 mg/dL, previously ranging from 200s to 300s. No recent endocrinologist consultation. Ozempic was considered but not initiated due to prior episodes of nausea and pain. Emphasized the need for endocrinologist consultation for further management. - Order referral to endocrinologist - Recheck blood glucose levels - Discuss potential use of Ozempic with endocrinologist  Depression Experiencing depressive symptoms related to lack of healing. Continues to use a walker for mobility. Currently on Lexapro 20 mg daily. Absence of physical therapy has impacted mobility and mental health. - Continue Lexapro 20 mg daily - Order physical therapy referral from [facility] - Consider speech therapy for memory support  Hyperthyroidism Previously diagnosed with hyperthyroidism. Last thyroid function test conducted in December. Reassessment of thyroid function is necessary. - Recheck thyroid function tests - Continue methimazole 5 mg daily  Asthma No recent wheezing or coughing. Symptoms are well-controlled with current use of albuterol nebulizer solution as needed.bilateral lungs clear  - Continue albuterol nebulizer solution as needed  Hypertension Blood pressure is well-controlled at 130/80 mmHg with current antihypertensive regimen. - Continue current antihypertensive regimen  GERD - symptoms well controlled   Constipation Reports constipation without current medication use. Previously used Colace with limited effect. Discussed over-the-counter options such as Senokot and Miralax. 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 Advised daily Claritin for itching and regular skin moisturization. - Encourage daily Claritin for itching - Ensure adequate  hydration and skin moisturization  Follow-up Requires follow-up for various conditions and referrals. Coordination with specialists and lab tests is necessary for comprehensive care. - Order referral to podiatrist in [place] - Provide contact information for pulmonary specialist for follow-up - Order lab tests including lipid panel, thyroid function, chemistry panel, and anemia screening - Provide urine specimen cup for home collection - Schedule next appointment for six-month follow-up   Family/ staff Communication: Reviewed plan of care with patient verbalized understanding   Labs/tests ordered:  - CBC with Differential/Platelet - CMP with eGFR([facility]) - TSH - Hgb A1C - Lipid panel - Urine Microalbumin -creatinine ratio   Next Appointment : Return in about 6 months (around 10/26/2023) for medical mangement of chronic issues.Marland Kitchen   Spent 32 minutes of Face to face and non-face to face with patient  >50% time spent counseling; reviewing medical record; tests; labs; documentation and developing future plan of care.   Caesar Bookman, NP

## 2023-04-26 LAB — CBC WITH DIFFERENTIAL/PLATELET
Absolute Lymphocytes: 2216 {cells}/uL (ref 850–3900)
Absolute Monocytes: 481 {cells}/uL (ref 200–950)
Basophils Absolute: 50 {cells}/uL (ref 0–200)
Basophils Relative: 0.6 %
Eosinophils Absolute: 116 {cells}/uL (ref 15–500)
Eosinophils Relative: 1.4 %
HCT: 46.3 % — ABNORMAL HIGH (ref 35.0–45.0)
Hemoglobin: 14.4 g/dL (ref 11.7–15.5)
MCH: 26.2 pg — ABNORMAL LOW (ref 27.0–33.0)
MCHC: 31.1 g/dL — ABNORMAL LOW (ref 32.0–36.0)
MCV: 84.3 fL (ref 80.0–100.0)
MPV: 11.5 fL (ref 7.5–12.5)
Monocytes Relative: 5.8 %
Neutro Abs: 5437 {cells}/uL (ref 1500–7800)
Neutrophils Relative %: 65.5 %
Platelets: 281 10*3/uL (ref 140–400)
RBC: 5.49 10*6/uL — ABNORMAL HIGH (ref 3.80–5.10)
RDW: 17.1 % — ABNORMAL HIGH (ref 11.0–15.0)
Total Lymphocyte: 26.7 %
WBC: 8.3 10*3/uL (ref 3.8–10.8)

## 2023-04-26 LAB — LIPID PANEL
Cholesterol: 273 mg/dL — ABNORMAL HIGH (ref <200)
HDL: 55 mg/dL (ref >=50)
LDL Cholesterol (Calc): 187 mg/dL — ABNORMAL HIGH
Non-HDL Cholesterol (Calc): 218 mg/dL — ABNORMAL HIGH (ref <130)
Total CHOL/HDL Ratio: 5 (calc) — ABNORMAL HIGH (ref <5.0)
Triglycerides: 165 mg/dL — ABNORMAL HIGH (ref <150)

## 2023-04-26 LAB — COMPLETE METABOLIC PANEL WITH GFR
AG Ratio: 1.2 (calc) (ref 1.0–2.5)
ALT: 18 U/L (ref 6–29)
AST: 13 U/L (ref 10–35)
Albumin: 4.4 g/dL (ref 3.6–5.1)
Alkaline phosphatase (APISO): 103 U/L (ref 37–153)
BUN: 24 mg/dL (ref 7–25)
CO2: 22 mmol/L (ref 20–32)
Calcium: 10.2 mg/dL (ref 8.6–10.4)
Chloride: 100 mmol/L (ref 98–110)
Creat: 1.01 mg/dL (ref 0.50–1.03)
Globulin: 3.6 g/dL (ref 1.9–3.7)
Glucose, Bld: 369 mg/dL — ABNORMAL HIGH (ref 65–139)
Potassium: 5 mmol/L (ref 3.5–5.3)
Sodium: 135 mmol/L (ref 135–146)
Total Bilirubin: 0.4 mg/dL (ref 0.2–1.2)
Total Protein: 8 g/dL (ref 6.1–8.1)

## 2023-04-26 LAB — HEMOGLOBIN A1C
Hgb A1c MFr Bld: 10 %{Hb} — ABNORMAL HIGH (ref <5.7)
Mean Plasma Glucose: 240 mg/dL
eAG (mmol/L): 13.3 mmol/L

## 2023-04-26 LAB — TSH: TSH: 0.66 m[IU]/L

## 2023-04-27 ENCOUNTER — Inpatient Hospital Stay (HOSPITAL_COMMUNITY)
Admission: EM | Admit: 2023-04-27 | Discharge: 2023-05-01 | DRG: 682 | Disposition: A | Discharge status: Home / Self Care | Attending: Internal Medicine | Admitting: Internal Medicine

## 2023-04-27 ENCOUNTER — Emergency Department (HOSPITAL_COMMUNITY)

## 2023-04-27 ENCOUNTER — Inpatient Hospital Stay (HOSPITAL_COMMUNITY)

## 2023-04-27 ENCOUNTER — Other Ambulatory Visit: Payer: Self-pay

## 2023-04-27 ENCOUNTER — Encounter (HOSPITAL_COMMUNITY): Payer: Self-pay | Admitting: Emergency Medicine

## 2023-04-27 DIAGNOSIS — K219 Gastro-esophageal reflux disease without esophagitis: Secondary | ICD-10-CM | POA: Diagnosis present

## 2023-04-27 DIAGNOSIS — I69351 Hemiplegia and hemiparesis following cerebral infarction affecting right dominant side: Secondary | ICD-10-CM | POA: Diagnosis not present

## 2023-04-27 DIAGNOSIS — N179 Acute kidney failure, unspecified: Principal | ICD-10-CM | POA: Diagnosis present

## 2023-04-27 DIAGNOSIS — Z7401 Bed confinement status: Secondary | ICD-10-CM | POA: Diagnosis not present

## 2023-04-27 DIAGNOSIS — G4733 Obstructive sleep apnea (adult) (pediatric): Secondary | ICD-10-CM | POA: Diagnosis present

## 2023-04-27 DIAGNOSIS — R578 Other shock: Secondary | ICD-10-CM | POA: Diagnosis present

## 2023-04-27 DIAGNOSIS — G9341 Metabolic encephalopathy: Secondary | ICD-10-CM | POA: Diagnosis present

## 2023-04-27 DIAGNOSIS — I5032 Chronic diastolic (congestive) heart failure: Secondary | ICD-10-CM | POA: Diagnosis present

## 2023-04-27 DIAGNOSIS — E1143 Type 2 diabetes mellitus with diabetic autonomic (poly)neuropathy: Secondary | ICD-10-CM | POA: Diagnosis present

## 2023-04-27 DIAGNOSIS — I6523 Occlusion and stenosis of bilateral carotid arteries: Secondary | ICD-10-CM | POA: Diagnosis present

## 2023-04-27 DIAGNOSIS — R5383 Other fatigue: Secondary | ICD-10-CM | POA: Diagnosis not present

## 2023-04-27 DIAGNOSIS — E860 Dehydration: Secondary | ICD-10-CM | POA: Diagnosis present

## 2023-04-27 DIAGNOSIS — I69354 Hemiplegia and hemiparesis following cerebral infarction affecting left non-dominant side: Secondary | ICD-10-CM

## 2023-04-27 DIAGNOSIS — E785 Hyperlipidemia, unspecified: Secondary | ICD-10-CM | POA: Diagnosis present

## 2023-04-27 DIAGNOSIS — I2510 Atherosclerotic heart disease of native coronary artery without angina pectoris: Secondary | ICD-10-CM | POA: Diagnosis present

## 2023-04-27 DIAGNOSIS — M24542 Contracture, left hand: Secondary | ICD-10-CM | POA: Diagnosis present

## 2023-04-27 DIAGNOSIS — E039 Hypothyroidism, unspecified: Secondary | ICD-10-CM | POA: Diagnosis present

## 2023-04-27 DIAGNOSIS — Z881 Allergy status to other antibiotic agents status: Secondary | ICD-10-CM

## 2023-04-27 DIAGNOSIS — Z794 Long term (current) use of insulin: Secondary | ICD-10-CM | POA: Diagnosis not present

## 2023-04-27 DIAGNOSIS — E1110 Type 2 diabetes mellitus with ketoacidosis without coma: Principal | ICD-10-CM | POA: Diagnosis present

## 2023-04-27 DIAGNOSIS — Z880 Allergy status to penicillin: Secondary | ICD-10-CM

## 2023-04-27 DIAGNOSIS — K449 Diaphragmatic hernia without obstruction or gangrene: Secondary | ICD-10-CM | POA: Diagnosis not present

## 2023-04-27 DIAGNOSIS — F419 Anxiety disorder, unspecified: Secondary | ICD-10-CM | POA: Diagnosis present

## 2023-04-27 DIAGNOSIS — R4701 Aphasia: Secondary | ICD-10-CM | POA: Diagnosis present

## 2023-04-27 DIAGNOSIS — I499 Cardiac arrhythmia, unspecified: Secondary | ICD-10-CM | POA: Diagnosis not present

## 2023-04-27 DIAGNOSIS — I110 Hypertensive heart disease with heart failure: Secondary | ICD-10-CM | POA: Diagnosis present

## 2023-04-27 DIAGNOSIS — R000 Tachycardia, unspecified: Secondary | ICD-10-CM | POA: Diagnosis not present

## 2023-04-27 DIAGNOSIS — F329 Major depressive disorder, single episode, unspecified: Secondary | ICD-10-CM | POA: Diagnosis present

## 2023-04-27 DIAGNOSIS — E0590 Thyrotoxicosis, unspecified without thyrotoxic crisis or storm: Secondary | ICD-10-CM | POA: Diagnosis present

## 2023-04-27 DIAGNOSIS — Z951 Presence of aortocoronary bypass graft: Secondary | ICD-10-CM | POA: Diagnosis not present

## 2023-04-27 DIAGNOSIS — Z86718 Personal history of other venous thrombosis and embolism: Secondary | ICD-10-CM

## 2023-04-27 DIAGNOSIS — D72829 Elevated white blood cell count, unspecified: Secondary | ICD-10-CM | POA: Diagnosis not present

## 2023-04-27 DIAGNOSIS — I700 Atherosclerosis of aorta: Secondary | ICD-10-CM | POA: Diagnosis not present

## 2023-04-27 DIAGNOSIS — I10 Essential (primary) hypertension: Secondary | ICD-10-CM | POA: Diagnosis present

## 2023-04-27 DIAGNOSIS — R6889 Other general symptoms and signs: Secondary | ICD-10-CM | POA: Diagnosis not present

## 2023-04-27 DIAGNOSIS — N170 Acute kidney failure with tubular necrosis: Secondary | ICD-10-CM | POA: Diagnosis not present

## 2023-04-27 DIAGNOSIS — E872 Acidosis, unspecified: Secondary | ICD-10-CM | POA: Diagnosis not present

## 2023-04-27 DIAGNOSIS — Z993 Dependence on wheelchair: Secondary | ICD-10-CM

## 2023-04-27 DIAGNOSIS — I679 Cerebrovascular disease, unspecified: Secondary | ICD-10-CM | POA: Diagnosis present

## 2023-04-27 DIAGNOSIS — K3184 Gastroparesis: Secondary | ICD-10-CM | POA: Diagnosis present

## 2023-04-27 DIAGNOSIS — K8689 Other specified diseases of pancreas: Secondary | ICD-10-CM | POA: Diagnosis not present

## 2023-04-27 DIAGNOSIS — Z7901 Long term (current) use of anticoagulants: Secondary | ICD-10-CM

## 2023-04-27 DIAGNOSIS — R4781 Slurred speech: Secondary | ICD-10-CM | POA: Diagnosis not present

## 2023-04-27 DIAGNOSIS — Z79899 Other long term (current) drug therapy: Secondary | ICD-10-CM

## 2023-04-27 DIAGNOSIS — Z87891 Personal history of nicotine dependence: Secondary | ICD-10-CM

## 2023-04-27 DIAGNOSIS — Z91041 Radiographic dye allergy status: Secondary | ICD-10-CM

## 2023-04-27 DIAGNOSIS — Z886 Allergy status to analgesic agent status: Secondary | ICD-10-CM

## 2023-04-27 DIAGNOSIS — Z743 Need for continuous supervision: Secondary | ICD-10-CM | POA: Diagnosis not present

## 2023-04-27 DIAGNOSIS — R5381 Other malaise: Secondary | ICD-10-CM | POA: Diagnosis present

## 2023-04-27 DIAGNOSIS — M25532 Pain in left wrist: Secondary | ICD-10-CM | POA: Diagnosis present

## 2023-04-27 DIAGNOSIS — E034 Atrophy of thyroid (acquired): Secondary | ICD-10-CM | POA: Diagnosis not present

## 2023-04-27 DIAGNOSIS — R739 Hyperglycemia, unspecified: Secondary | ICD-10-CM | POA: Diagnosis not present

## 2023-04-27 DIAGNOSIS — E1169 Type 2 diabetes mellitus with other specified complication: Secondary | ICD-10-CM

## 2023-04-27 DIAGNOSIS — D3500 Benign neoplasm of unspecified adrenal gland: Secondary | ICD-10-CM | POA: Diagnosis present

## 2023-04-27 DIAGNOSIS — R109 Unspecified abdominal pain: Secondary | ICD-10-CM | POA: Diagnosis not present

## 2023-04-27 DIAGNOSIS — M85842 Other specified disorders of bone density and structure, left hand: Secondary | ICD-10-CM | POA: Diagnosis not present

## 2023-04-27 HISTORY — DX: Acute kidney failure, unspecified: N17.9

## 2023-04-27 LAB — URINALYSIS, MICROSCOPIC (REFLEX)

## 2023-04-27 LAB — CBC
HCT: 41 % (ref 36.0–46.0)
Hemoglobin: 13.4 g/dL (ref 12.0–15.0)
MCH: 26.2 pg (ref 26.0–34.0)
MCHC: 32.7 g/dL (ref 30.0–36.0)
MCV: 80.1 fL (ref 80.0–100.0)
Platelets: 343 10*3/uL (ref 150–400)
RBC: 5.12 MIL/uL — ABNORMAL HIGH (ref 3.87–5.11)
RDW: 17.4 % — ABNORMAL HIGH (ref 11.5–15.5)
WBC: 10.7 10*3/uL — ABNORMAL HIGH (ref 4.0–10.5)
nRBC: 0 % (ref 0.0–0.2)

## 2023-04-27 LAB — URINALYSIS, ROUTINE W REFLEX MICROSCOPIC
Glucose, UA: NEGATIVE mg/dL
Glucose, UA: 100 mg/dL — AB
Hgb urine dipstick: NEGATIVE
Hgb urine dipstick: NEGATIVE
Ketones, ur: NEGATIVE mg/dL
Ketones, ur: NEGATIVE mg/dL
Nitrite: NEGATIVE
Nitrite: NEGATIVE
Protein, ur: NEGATIVE mg/dL
Protein, ur: NEGATIVE mg/dL
Specific Gravity, Urine: >1.03 — ABNORMAL HIGH (ref 1.005–1.030)
Specific Gravity, Urine: >1.03 — ABNORMAL HIGH (ref 1.005–1.030)
pH: 5.5 (ref 5.0–8.0)
pH: 5.5 (ref 5.0–8.0)

## 2023-04-27 LAB — COMPREHENSIVE METABOLIC PANEL
ALT: 19 U/L (ref 0–44)
AST: 22 U/L (ref 15–41)
Albumin: 4.1 g/dL (ref 3.5–5.0)
Alkaline Phosphatase: 83 U/L (ref 38–126)
Anion gap: 19 — ABNORMAL HIGH (ref 5–15)
BUN: 37 mg/dL — ABNORMAL HIGH (ref 6–20)
CO2: 19 mmol/L — ABNORMAL LOW (ref 22–32)
Calcium: 10.1 mg/dL (ref 8.9–10.3)
Chloride: 99 mmol/L (ref 98–111)
Creatinine, Ser: 3.94 mg/dL — ABNORMAL HIGH (ref 0.44–1.00)
GFR, Estimated: 13 mL/min — ABNORMAL LOW (ref >60)
Glucose, Bld: 331 mg/dL — ABNORMAL HIGH (ref 70–99)
Potassium: 4.8 mmol/L (ref 3.5–5.1)
Sodium: 137 mmol/L (ref 135–145)
Total Bilirubin: 0.7 mg/dL (ref 0.0–1.2)
Total Protein: 8.3 g/dL — ABNORMAL HIGH (ref 6.5–8.1)

## 2023-04-27 LAB — BASIC METABOLIC PANEL
Anion gap: 17 — ABNORMAL HIGH (ref 5–15)
BUN: 40 mg/dL — ABNORMAL HIGH (ref 6–20)
CO2: 16 mmol/L — ABNORMAL LOW (ref 22–32)
Calcium: 9.3 mg/dL (ref 8.9–10.3)
Chloride: 106 mmol/L (ref 98–111)
Creatinine, Ser: 3.52 mg/dL — ABNORMAL HIGH (ref 0.44–1.00)
GFR, Estimated: 15 mL/min — ABNORMAL LOW (ref >60)
Glucose, Bld: 226 mg/dL — ABNORMAL HIGH (ref 70–99)
Potassium: 3.7 mmol/L (ref 3.5–5.1)
Sodium: 139 mmol/L (ref 135–145)

## 2023-04-27 LAB — RAPID URINE DRUG SCREEN, HOSP PERFORMED
Amphetamines: NOT DETECTED
Barbiturates: NOT DETECTED
Benzodiazepines: NOT DETECTED
Cocaine: NOT DETECTED
Opiates: NOT DETECTED
Tetrahydrocannabinol: NOT DETECTED

## 2023-04-27 LAB — BETA-HYDROXYBUTYRIC ACID
Beta-Hydroxybutyric Acid: 1.96 mmol/L — ABNORMAL HIGH (ref 0.05–0.27)
Beta-Hydroxybutyric Acid: 1.14 mmol/L — ABNORMAL HIGH (ref 0.05–0.27)

## 2023-04-27 LAB — GLUCOSE, CAPILLARY
Glucose-Capillary: 279 mg/dL — ABNORMAL HIGH (ref 70–99)
Glucose-Capillary: 194 mg/dL — ABNORMAL HIGH (ref 70–99)

## 2023-04-27 LAB — CBG MONITORING, ED: Glucose-Capillary: 325 mg/dL — ABNORMAL HIGH (ref 70–99)

## 2023-04-27 LAB — LIPASE, BLOOD: Lipase: 27 U/L (ref 11–51)

## 2023-04-27 LAB — LACTIC ACID, PLASMA: Lactic Acid, Venous: 5 mmol/L (ref 0.5–1.9)

## 2023-04-27 LAB — I-STAT CG4 LACTIC ACID, ED: Lactic Acid, Venous: 4.3 mmol/L (ref 0.5–1.9)

## 2023-04-27 MED ORDER — ALBUTEROL SULFATE (2.5 MG/3ML) 0.083% IN NEBU: 2.5000 mg | INHALATION_SOLUTION | RESPIRATORY_TRACT | Status: DC | PRN

## 2023-04-27 MED ORDER — LORAZEPAM 2 MG/ML IJ SOLN
0.5000 mg | Freq: Once | INTRAMUSCULAR | Status: AC | PRN
  Administered 2023-04-28: 0.5 mg via INTRAMUSCULAR
  Filled 2023-04-27: qty 1

## 2023-04-27 MED ORDER — SODIUM CHLORIDE 0.9 % IV SOLN: INTRAVENOUS | Status: DC

## 2023-04-27 MED ORDER — HYDROMORPHONE HCL 1 MG/ML IJ SOLN
0.5000 mg | INTRAMUSCULAR | Status: DC | PRN
  Administered 2023-04-27 – 2023-04-30 (×8): 0.5 mg via INTRAVENOUS
  Filled 2023-04-27 (×8): qty 0.5

## 2023-04-27 MED ORDER — POLYETHYLENE GLYCOL 3350 17 G PO PACK: 17.0000 g | PACK | Freq: Every day | ORAL | Status: DC | PRN

## 2023-04-27 MED ORDER — DEXTROSE IN LACTATED RINGERS 5 % IV SOLN: INTRAVENOUS | Status: DC

## 2023-04-27 MED ORDER — LINEZOLID 600 MG/300ML IV SOLN
600.0000 mg | Freq: Two times a day (BID) | INTRAVENOUS | Status: DC
  Filled 2023-04-27: qty 300

## 2023-04-27 MED ORDER — SODIUM CHLORIDE 0.9 % IV BOLUS
1000.0000 mL | Freq: Once | INTRAVENOUS | Status: AC
  Administered 2023-04-27: 1000 mL via INTRAVENOUS

## 2023-04-27 MED ORDER — ONDANSETRON HCL 4 MG/2ML IJ SOLN
4.0000 mg | Freq: Once | INTRAMUSCULAR | Status: AC
  Administered 2023-04-27: 4 mg via INTRAVENOUS
  Filled 2023-04-27: qty 2

## 2023-04-27 MED ORDER — METOPROLOL TARTRATE 5 MG/5ML IV SOLN: 5.0000 mg | Freq: Four times a day (QID) | INTRAVENOUS | Status: DC | PRN

## 2023-04-27 MED ORDER — PANTOPRAZOLE SODIUM 40 MG IV SOLR
40.0000 mg | INTRAVENOUS | Status: DC
  Administered 2023-04-27: 40 mg via INTRAVENOUS
  Filled 2023-04-27: qty 10

## 2023-04-27 MED ORDER — SODIUM CHLORIDE 0.9 % IV SOLN
2.0000 g | Freq: Once | INTRAVENOUS | Status: AC
  Administered 2023-04-27: 2 g via INTRAVENOUS
  Filled 2023-04-27: qty 12.5

## 2023-04-27 MED ORDER — LORAZEPAM 0.5 MG PO TABS: 0.5000 mg | ORAL_TABLET | Freq: Four times a day (QID) | ORAL | Status: AC | PRN

## 2023-04-27 MED ORDER — METOPROLOL TARTRATE 5 MG/5ML IV SOLN
2.5000 mg | Freq: Once | INTRAVENOUS | Status: AC
  Administered 2023-04-27: 2.5 mg via INTRAVENOUS
  Filled 2023-04-27: qty 5

## 2023-04-27 MED ORDER — LINEZOLID 600 MG/300ML IV SOLN
600.0000 mg | Freq: Once | INTRAVENOUS | Status: AC
  Administered 2023-04-27: 600 mg via INTRAVENOUS
  Filled 2023-04-27 (×2): qty 300

## 2023-04-27 MED ORDER — ACETAMINOPHEN 650 MG RE SUPP: 650.0000 mg | Freq: Four times a day (QID) | RECTAL | Status: DC | PRN

## 2023-04-27 MED ORDER — DEXTROSE 50 % IV SOLN: 0.0000 mL | INTRAVENOUS | Status: DC | PRN

## 2023-04-27 MED ORDER — HEPARIN SODIUM (PORCINE) 5000 UNIT/ML IJ SOLN
5000.0000 [IU] | Freq: Three times a day (TID) | INTRAMUSCULAR | Status: DC
  Administered 2023-04-27 – 2023-04-29 (×5): 5000 [IU] via SUBCUTANEOUS
  Filled 2023-04-27 (×5): qty 1

## 2023-04-27 MED ORDER — LACTATED RINGERS IV BOLUS
20.0000 mL/kg | Freq: Once | INTRAVENOUS | Status: AC
  Administered 2023-04-27: 1000 mL via INTRAVENOUS

## 2023-04-27 MED ORDER — LACTATED RINGERS IV SOLN: INTRAVENOUS | Status: DC

## 2023-04-27 MED ORDER — ONDANSETRON HCL 4 MG/2ML IJ SOLN
4.0000 mg | Freq: Four times a day (QID) | INTRAMUSCULAR | Status: DC | PRN
  Administered 2023-04-27: 4 mg via INTRAVENOUS
  Filled 2023-04-27: qty 2

## 2023-04-27 MED ORDER — ONDANSETRON HCL 4 MG PO TABS: 4.0000 mg | ORAL_TABLET | Freq: Four times a day (QID) | ORAL | Status: DC | PRN

## 2023-04-27 MED ORDER — BISACODYL 10 MG RE SUPP: 10.0000 mg | Freq: Every day | RECTAL | Status: DC | PRN

## 2023-04-27 MED ORDER — ACETAMINOPHEN 325 MG PO TABS: 650.0000 mg | ORAL_TABLET | Freq: Four times a day (QID) | ORAL | Status: DC | PRN

## 2023-04-27 MED ORDER — INSULIN REGULAR(HUMAN) IN NACL 100-0.9 UT/100ML-% IV SOLN
INTRAVENOUS | Status: DC
  Administered 2023-04-27: 10.5 [IU]/h via INTRAVENOUS
  Filled 2023-04-27: qty 100

## 2023-04-27 MED ORDER — POTASSIUM CHLORIDE 10 MEQ/100ML IV SOLN: 10.0000 meq | INTRAVENOUS | Status: DC

## 2023-04-27 NOTE — Significant Event (Signed)
 Rapid Response Event Note   Reason for Call :  Hypotension-60s and loss of IV access  Initial Focused Assessment:  Pt lying in bed with eyes open, in no visible distress.  She will not speak or follow commands. She will look at nurse when her name is called. She does recognize her husband at bedside and, with lots of stimulation, wil tell us who he is. R A/C PIV is beeping occluded-this line has a LR bolus, insulin gtt, and antibiotics infusing. This is her only IV access. Dressing taken down and found that catheter kinked. Catheter fixed and PIV flowing. With bolus going, pt's BP increased and she is more awake.   HR-100s, BP-68/46 without bolus going, 118/44 with bolus going, RR-16, SpO2-98% on RA.    Interventions:  No RRT interventions needed at this time.  Plan of Care:  BP better with IV bolus going and pt is more awake. Per IV team at bedside, they have exhausted all efforts possible tonight to gain additional PIV access. Pt would need PICC/CVC if additional access needed or if PIV fails. All IV medications ordered at this time are compatible with insulin gtt. Please call RRT if further assistance needed.   Event Summary:   MD Notified: Valli Glance NP notified by bedside RN Call [phone] Arrival [phone] End Time:0000  Terrilyn Saver, RN

## 2023-04-27 NOTE — Progress Notes (Signed)
 Pt arrived on the unit from ED. Pt alert, but confused, and unable to follow simple directions. Pt on Insuline drip at 10.5mg /hr. Endo Tool continued. Blood sugar obtained, and was 279. IV consult initiated as pt needing more than 3 IV lines d/t limited venous access. Pt very much irritated/agitated.

## 2023-04-27 NOTE — Progress Notes (Signed)
 Orthopedic Tech Progress Note Patient Details:  Lori [name] [birth year] [id number]  Ortho Devices Type of Ortho Device: Thumb velcro splint Ortho Device/Splint Location: lue Ortho Device/Splint Interventions: Ordered, Application, Adjustment  I got permission from the dr to apply a velcro thumb spica. When applying the brace I was able to immobilize the thumb, but due to the contraction of the other fingers I was unable to get the other fingers outside of the brace. Post Interventions Patient Tolerated: Well Instructions Provided: Care of device, Adjustment of device  Trinna Post 04/27/2023, 8:03 PM

## 2023-04-27 NOTE — ED Notes (Signed)
 Foley cath placed using sterile technique and two staff members.

## 2023-04-27 NOTE — H&P (Signed)
 [facility] H&P   Patient Demographics:    Lori [name], is a 53 y.o. female  MRN: [id number]   DOB - 1970-02-27  Admit Date - 04/27/2023  Outpatient Primary MD for the patient is Lori [name], Lori Citrin, NP  Referring MD/NP/PA: Dr Jacqulyn Bath  Patient coming from: home  Chief Complaint  Patient presents with   Emesis   Wrist Pain      HPI:    Lori [name]  is a 53 y.o. female, with medical history significant of type 2 diabetes mellitus with gastroparesis, CAD status post CABG in [place], chronic diastolic heart failure, OSA on CPAP, history of CVA with residual left-sided weakness and currently bedbound, history of DVT on Xarelto, pheochromocytoma status post right adrenalectomy, hypothyroidism , will admission in the past, 10 admissions over the last few month, most recently last January for GI bleed secondary to esophagitis.   -Patient presents to ED secondary to emesis and wrist pain, she is very poor historian, currently confused, cannot provide any history, so it was obtained from husband by phone, and ED staff and previous medical records. -Apparently patient was started on nasal spray last Friday, husband reports she has been having poor appetite over few days, but yesterday evening he developed severe nausea, vomiting, 7-8 episodes of large vomiting since, but no coffee-ground emesis, no diarrhea, no melena, as well she had left wrist pain, unsure if she had fall or not. 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  ED physician discussed with Dr. Eulah Pont who recommended splint and he will evaluate in ED, [facility] hospitalist consulted to admit.     Review of systems:    Cannot provide any reliable complaints given her encephalopathy   With Past History of the following :    Past Medical History:  Diagnosis Date   CAD (coronary artery disease)    Depression    Diabetes mellitus without complication (HCC)    History of CT scan    History of mammogram    History of MRI    Hypertension    Pheochromocytoma    Reversible cerebrovascular vasoconstriction syndrome    Stroke [facility])    Thyroid disease       Past Surgical History:  Procedure Laterality Date   ABDOMINAL HYSTERECTOMY     BIOPSY  02/06/2023   Procedure: BIOPSY;  Surgeon: Benancio Deeds, MD;  Location: [facility] ENDOSCOPY;  Service: Gastroenterology;;   CESAREAN  SECTION     3   CORONARY ARTERY BYPASS GRAFT     ESOPHAGOGASTRODUODENOSCOPY (EGD) WITH PROPOFOL N/A 02/06/2023   Procedure: ESOPHAGOGASTRODUODENOSCOPY (EGD) WITH PROPOFOL;  Surgeon: Benancio Deeds, MD;  Location: [facility] ENDOSCOPY;  Service: Gastroenterology;  Laterality: N/A;   Right adrenal gland removal for pheochromocytoma Right       Social History:     Social History   Tobacco Use   Smoking status: Never   Smokeless tobacco: Never  Substance Use Topics   Alcohol use: Not Currently       Family History :     Family History  Problem Relation Age of Onset   Hypertension Mother    Diabetes Mother    Atrial fibrillation Mother    Hypertension Father    Heart attack Father    Dementia Father    Diabetes Brother    Brain cancer Brother    Asthma Daughter    Sickle cell anemia Son    Atrial fibrillation Maternal Uncle      Home Medications:   Prior to Admission medications   Medication Sig Start Date End  Date Taking? Authorizing Provider  acetaminophen (TYLENOL) 325 MG tablet Take 2 tablets (650 mg total) by mouth every 6 (six) hours as needed for mild pain (pain score 1-3) (or Fever >/= 101). 02/12/23   Rodolph Bong, MD  albuterol (PROVENTIL) (2.5 MG/3ML) 0.083% nebulizer solution Take 3 mLs (2.5 mg total) by nebulization every 6 (six) hours as needed for wheezing or shortness of breath. 01/01/23   Lori [name], Dinah C, NP  albuterol (VENTOLIN HFA) 108 (90 Base) MCG/ACT inhaler INHALE 2 PUFFS BY MOUTH EVERY 6 HOURS AS NEEDED FOR WHEEZING AND FOR SHORTNESS OF BREATH Strength: 108 (90 Base) MCG/ACT Patient taking differently: Inhale 2 puffs into the lungs every 6 (six) hours as needed for shortness of breath. 01/01/23   Lori [name], Dinah C, NP  atorvastatin (LIPITOR) 80 MG tablet TAKE 1 TABLET (80 MG TOTAL) BY MOUTH DAILY. 03/31/23   Lori [name], Dinah C, NP  baclofen (LIORESAL) 10 MG tablet Take 1 tablet (10 mg total) by mouth 3 (three) times daily. 01/08/23   Lori [name], Dinah C, NP  clopidogrel (PLAVIX) 75 MG tablet TAKE 1 TABLET BY MOUTH DAILY 04/09/23   Lori [name], Lori Citrin, NP  Continuous Glucose Receiver (FREESTYLE LIBRE 2 READER) DEVI 1 Device by Does not apply route daily. E11.69 11/01/22   Lori [name], Dinah C, NP  Continuous Glucose Sensor (FREESTYLE LIBRE 14 DAY SENSOR) MISC 1 each by Does not apply route as needed. DX: E11.69 12/24/22   Lori [name], Dinah C, NP  cyclobenzaprine (FLEXERIL) 5 MG tablet TAKE 1 TABLET BY MOUTH 3 TIMES DAILY AS NEEDED FOR MUSCLE SPASMS Patient taking differently: Take 5 mg by mouth at bedtime. 11/18/22   Lovorn, Aundra Millet, MD  diclofenac Sodium (VOLTAREN) 1 % GEL Apply 2 g topically 4 (four) times daily.    [provider]  escitalopram (LEXAPRO) 20 MG tablet Take 1 tablet (20 mg total) by mouth every morning. 01/01/23   Lori [name], Dinah C, NP  ezetimibe (ZETIA) 10 MG tablet TAKE 1 TABLET BY MOUTH DAILY 04/09/23   Lori [name], Dinah C, NP  fluticasone (FLONASE) 50 MCG/ACT nasal spray Place  2 sprays into both nostrils daily for 7 days. 02/12/23 04/25/23  Rodolph Bong, MD  gabapentin (NEURONTIN) 100 MG capsule Take 100 mg by mouth 3 (three) times daily. 11/14/22   [provider]  insulin glargine (LANTUS SOLOSTAR) 100 UNIT/ML Solostar Pen  Inject 14 Units into the skin at bedtime. 02/13/23   Rodolph Bong, MD  insulin lispro (HUMALOG) 100 UNIT/ML injection Sliding scale AC, HS CBG 121 - 150: 2 units  CBG 151 - 200: 3 units  CBG 201 - 250: 5 units  CBG 251 - 300: 8 units  CBG 301 - 350: 11 units  CBG 351 - 400: 15 units  CBG > 400: call MD 12/06/22   Jerald Kief, MD  loratadine (CLARITIN) 10 MG tablet Take 1 tablet (10 mg total) by mouth daily. 02/12/23   Rodolph Bong, MD  LORazepam (ATIVAN) 0.5 MG tablet Take 1 tablet (0.5 mg total) by mouth at bedtime. 02/12/23   Rodolph Bong, MD  methimazole (TAPAZOLE) 5 MG tablet TAKE 1 TABLET BY MOUTH EVERY  MORNING 04/09/23   Lori [name], Dinah C, NP  Metoclopramide HCl (GIMOTI) 15 MG/ACT SOLN Place 1 spray into the nose 3 (three) times daily before meals. 30 minutes before meals 04/18/23   Armbruster, Willaim Rayas, MD  metoprolol tartrate (LOPRESSOR) 100 MG tablet Take 1 tablet (100 mg total) by mouth 2 (two) times daily. Hold for SBP < 110 02/12/23   Rodolph Bong, MD  oxymetazoline (AFRIN) 0.05 % nasal spray Place 1 spray into both nostrils 2 (two) times daily as needed for congestion (nasal bleeding). 02/12/23   Rodolph Bong, MD  pantoprazole (PROTONIX) 40 MG tablet Take 1 tablet (40 mg total) by mouth 2 (two) times daily. 04/11/23   Armbruster, Willaim Rayas, MD  Prucalopride Succinate (MOTEGRITY) 2 MG TABS Take 1 tablet (2 mg total) by mouth daily. LOT: [id number], EXP: 11-2023 04/11/23   Benancio Deeds, MD  psyllium (METAMUCIL) 58.6 % powder Take 1 packet by mouth daily. Patient not taking: Reported on 04/25/2023    [provider]  ranolazine (RANEXA) 500 MG 12 hr tablet Take 1 tablet (500 mg total) by mouth 2  (two) times daily. 10/09/22 01/23/24  [place], Alvira Philips, DO  rivaroxaban (XARELTO) 10 MG TABS tablet TAKE 1 TABLET BY MOUTH DAILY 04/09/23   Lori [name], Dinah C, NP  senna-docusate (SENOKOT-S) 8.6-50 MG tablet Take 1 tablet by mouth 2 (two) times daily. 02/12/23   Rodolph Bong, MD  sorbitol 70 % SOLN Take 30 mLs by mouth daily as needed for moderate constipation. 02/12/23   Rodolph Bong, MD  sucralfate (CARAFATE) 1 GM/10ML suspension Take 10 mLs (1 g total) by mouth at bedtime. 04/11/23   Armbruster, Willaim Rayas, MD  tamsulosin (FLOMAX) 0.4 MG CAPS capsule Take 0.4 mg by mouth at bedtime. 11/04/22   [provider]  topiramate (TOPAMAX) 25 MG tablet Take 1 tablet (25 mg total) by mouth 2 (two) times daily. 07/10/22   Sherryll Burger, Pratik D, DO  zinc oxide (BALMEX) 11.3 % CREA cream Apply 1 Application topically 2 (two) times daily.    [provider]     Allergies:     Allergies  Allergen Reactions   Asa [Aspirin] Anaphylaxis, Swelling and Other (See Comments)    Throat closes   Contrast Media [Iodinated Contrast Media] Anaphylaxis and Swelling   Imitrex [Sumatriptan] Anaphylaxis   Ms Contin [Morphine] Anaphylaxis and Swelling   Penicillins Swelling and Other (See Comments)    Mouth and eyes swell   Firvanq [Vancomycin] Other (See Comments)    Red Man's Syndrome   Cipro [Ciprofloxacin Hcl] Nausea And Vomiting   Nitrofuran Derivatives Anxiety   Wound Dressing Adhesive Itching and Rash  Physical Exam:   Vitals  Blood pressure 122/78, pulse (!) 138, temperature 98.1 F (36.7 C), temperature source Oral, resp. rate 18, height 5\' 2"  (1.575 m), weight 84 kg, SpO2 100%.   1. General Ill-appearing female, uncomfortable in bed  2.  Is moaning, unable to answer any questions or follow any commands  3.  Left upper extremity contracted, chronic left-sided weakness   4. Ears and Eyes appear Normal, Conjunctivae clear, PERRLA.  Frequently dry oral mucosa  5. Supple Neck, No JVD, No  cervical lymphadenopathy appriciated, No Carotid Bruits.  6. Symmetrical Chest wall movement, Good air movement bilaterally, CTAB.  7.  Tachycardic, No Gallops, Rubs or Murmurs, No Parasternal Heave.  8. Positive Bowel Sounds, Abdomen Soft, as tenderness to palpation in lower abdomen.   9.  No Cyanosis, slow skin Turgor, No Skin Rash or Bruise.     Data Review:    CBC Recent Labs  Lab 04/25/23 1430 04/27/23 1542  WBC 8.3 10.7*  HGB 14.4 13.4  HCT 46.3* 41.0  PLT 281 343  MCV 84.3 80.1  MCH 26.2* 26.2  MCHC 31.1* 32.7  RDW 17.1* 17.4*  EOSABS 116  --   BASOSABS 50  --    ------------------------------------------------------------------------------------------------------------------  Chemistries  Recent Labs  Lab 04/25/23 1430 04/27/23 1542  NA 135 137  K 5.0 4.8  CL 100 99  CO2 22 19*  GLUCOSE 369* 331*  BUN 24 37*  CREATININE 1.01 3.94*  CALCIUM 10.2 10.1  AST 13 22  ALT 18 19  ALKPHOS  --  83  BILITOT 0.4 0.7   ------------------------------------------------------------------------------------------------------------------ estimated creatinine clearance is 17.2 mL/min (A) (by C-G formula based on SCr of 3.94 mg/dL (H)). ------------------------------------------------------------------------------------------------------------------ Recent Labs    04/25/23 1430  TSH 0.66    Coagulation profile No results for input(s): "INR", "PROTIME" in the last 168 hours. ------------------------------------------------------------------------------------------------------------------- No results for input(s): "DDIMER" in the last 72 hours. -------------------------------------------------------------------------------------------------------------------  Cardiac Enzymes No results for input(s): "CKMB", "TROPONINI", "MYOGLOBIN" in the last 168 hours.  Invalid input(s):  "CK" ------------------------------------------------------------------------------------------------------------------    Component Value Date/Time   BNP 19.5 09/06/2022 2242     ---------------------------------------------------------------------------------------------------------------  Urinalysis    Component Value Date/Time   COLORURINE YELLOW 04/09/2023 1919   APPEARANCEUR HAZY (A) 04/09/2023 1919   LABSPEC 1.009 04/09/2023 1919   PHURINE 5.0 04/09/2023 1919   GLUCOSEU NEGATIVE 04/09/2023 1919   HGBUR NEGATIVE 04/09/2023 1919   BILIRUBINUR NEGATIVE 04/09/2023 1919   KETONESUR NEGATIVE 04/09/2023 1919   PROTEINUR NEGATIVE 04/09/2023 1919   NITRITE NEGATIVE 04/09/2023 1919   LEUKOCYTESUR NEGATIVE 04/09/2023 1919    ----------------------------------------------------------------------------------------------------------------   Imaging Results:    CT ABDOMEN PELVIS WO CONTRAST Result Date: 04/27/2023 CLINICAL DATA:  Abdominal pain EXAM: CT ABDOMEN AND PELVIS WITHOUT CONTRAST TECHNIQUE: Multidetector CT imaging of the abdomen and pelvis was performed following the standard protocol without IV contrast. RADIATION DOSE REDUCTION: This exam was performed according to the departmental dose-optimization program which includes automated exposure control, adjustment of the mA and/or kV according to patient size and/or use of iterative reconstruction technique. COMPARISON:  CT 02/04/2023 FINDINGS: Lower chest: Lung bases are clear. Coronary vascular calcifications. Small hiatal hernia Hepatobiliary: No focal liver abnormality is seen. No gallstones, gallbladder wall thickening, or biliary dilatation. Pancreas: Coarse pancreatic calcifications suggestive of chronic pancreatitis. No acute inflammation Spleen: Normal in size without focal abnormality. Adrenals/Urinary Tract: History of right adrenalectomy. Left adrenal gland demonstrates mild diffuse thickening but no mass. No  hydronephrosis. The bladder is  unremarkable. Stomach/Bowel: Stomach is within normal limits. Appendix appears normal. No evidence of bowel wall thickening, distention, or inflammatory changes. Vascular/Lymphatic: Aortic atherosclerosis. No enlarged abdominal or pelvic lymph nodes. Reproductive: Hysterectomy.  No adnexal mass Other: Negative for pelvic effusion or free air. Lobulated soft tissue density at the low midline anterior pelvis is chronic and may relate to chronic scarring. Musculoskeletal: No acute osseous abnormality IMPRESSION: 1. No CT evidence for acute intra-abdominal or pelvic abnormality. 2. Small hiatal hernia. 3. Findings suggestive of chronic pancreatitis. No acute inflammatory changes about the pancreas 4. Aortic atherosclerosis. Electronically Signed   By: Jasmine Pang M.D.   On: 04/27/2023 17:17   DG Wrist Complete Left Result Date: 04/27/2023 CLINICAL DATA:  Wrist pain EXAM: LEFT WRIST - COMPLETE 3+ VIEW COMPARISON:  12/20/2021 FINDINGS: Bony demineralization. 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 The orientation of the scaphoid contributes to bony superimposition and makes it difficult to fully assess. No definite fracture identified. IMPRESSION: 1. 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 2. Bony demineralization. Electronically Signed   By: Gaylyn Rong M.D.   On: 04/27/2023 16:59    EKG:  Vent. rate 133 BPM PR interval 113 ms QRS duration 85 ms QT/QTcB 334/497 ms P-R-T axes 66 86 -70 Sinus tachycardia Ventricular premature complex Abnormal T, consider ischemia, diffuse leads  Assessment & Plan:    Principal Problem:   Acute renal failure (ARF) (HCC) Active Problems:   Essential hypertension   Chronic diastolic CHF (congestive heart failure) (HCC)   CAD (coronary artery disease)   Debility   Hypothyroidism   GERD (gastroesophageal reflux  disease)   History of DVT (deep vein thrombosis)   Carotid stenosis, bilateral   Cerebrovascular disease   Anticoagulated  AKI -Likely due to volume depletion and dehydration, nausea and vomiting, poor oral intake, and DKA . -No evidence of retention, Foley catheter inserted in ED with minimal output  -urine analysis and urine sodium. -Continue with IV fluids -Avoid nephrotoxic medications  SIRS -Meets SIRS criteria, severe lactic acidosis, tachypneic, tachycardic with encephalopathy and AKI. -Unclear if her presentation is related to infection versus acute etiology like DKA. -Obtain urine cultures, will obtain blood cultures -Give 1 dose of cefepime and Zyvox empirically for now.(Not continue as low clinical suspicion for infection) -Check procalcitonin. -Check checks x-ray. -Check UA -Significantly uncomfortable, tachycardic, will insert Foley catheter to rule out retention  Diabetic ketoacidosis Lactic acidosis. -CT abdomen and pelvis with no acute findings to explain her lactic acidosis, but this is most likely in the setting of her DKA. -Admitted under DKA protocol, started on insulin drip, BHA, BMP routinely -Continue with IV fluids -A1c 10.5 04/25/2023, no need to repeat -Will keep n.p.o.  Acute metabolic encephalopathy -Currently altered, with known CVA at baseline, but she is confused cannot provide any reliable complaints -CT head if no improvement after correcting her electrolyte derangement and acidosis -Keep n.p.o. for now given DKA and likely she will be able to swallow safely, will consult SLP  Dehydration -continue with IV fluids   Hypothyroidism  -Resume Tapazole when able to take oral   Sinus tachycardia -due to abd pain, discomfort and possible DKA, will start on as needed  metoprolol able to take her home metoprolol.   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 -Per husband she is bedbound, wheelchair  dependent. -On Plavix and Xarelto when she is able to take oral. -Tomorrow if  cannot take oral and kidney function has improved then will start on heparin gtt. -Consult PT, OT  Nausea, vomiting, abdominal pain -No significant finding on imaging, concern of chronic pancreatitis, on imaging, but lipase within normal limit -Related to gastroparesis versus DKA -Continue with as needed Zofran, IV fluids -Does not look like bowel ischemia, but low threshold to repeat CT abdomen and pelvis if she remains with persistent lactic acidosis despite appropriate hydration.   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 -And surgery Dr. Eulah Pont consulted by ED physician, surgery to see during hospital stay,, meanwhile initial recommendation for wrist splint   CAD/status post CABG -resume Plavix, Ranexa, statin and metoprolol when able to take oral  Hyperlipidemia -resume Statin when able to take oral   OSA on CPAP -Her presentation with vomiting will hold on CPAP for now and this can be resumed when this resolves  Pheochromocytoma s/p right adrenalectomy   DVT Prophylaxis resume xarelto when stable  AM Labs Ordered, also please review Full Orders  Family Communication: Admission, patients condition and plan of care including tests being ordered have been discussed with the patient and spouse by phone who indicate understanding and agree with the plan and Code Status.  Code Status full code  Likely DC to pending further workup  Consults called: None  Admission status: Inpatient  Time spent in minutes : 75 minutes   Huey Bienenstock M.D on 04/27/2023 at 5:51 PM   [facility] - Office   [phone]

## 2023-04-27 NOTE — Progress Notes (Addendum)
 Pharmacy Antibiotic Note  Lori [name] [name] is a 53 y.o. female admitted on 04/27/2023 with sepsis.  Pharmacy has been consulted for zosyn and linezolid dosing. Noted hx of ESBL Kleb pneumonia UTI in 03/2022. No noted urinary symptoms, non-specific complaints including abdominal pain. Hx of PCN allergy, discussed with MD will change to cefepime. MD requesting 1x dose this evening and will f//u in AM to continue.   Plan: Cefepime 2g x1.  Linezolid x1 per MD.  Follow culture data for de-escalation.  Monitor renal function for dose adjustments as indicated.   Height: 5\' 2"  (157.5 cm) Weight: 84 kg (185 lb 3 oz) IBW/kg (Calculated) : 50.1  Temp (24hrs), Avg:98.1 F (36.7 C), Min:98.1 F (36.7 C), Max:98.1 F (36.7 C)  Recent Labs  Lab 04/25/23 1430 04/27/23 1542 04/27/23 1604  WBC 8.3 10.7*  --   CREATININE 1.01 3.94*  --   LATICACIDVEN  --   --  4.3*    Estimated Creatinine Clearance: 17.2 mL/min (A) (by C-G formula based on SCr of 3.94 mg/dL (H)).    Allergies  Allergen Reactions   Asa [Aspirin] Anaphylaxis, Swelling and Other (See Comments)    Throat closes   Contrast Media [Iodinated Contrast Media] Anaphylaxis and Swelling   Imitrex [Sumatriptan] Anaphylaxis   Ms Contin [Morphine] Anaphylaxis and Swelling   Penicillins Swelling and Other (See Comments)    Mouth and eyes swell   Firvanq [Vancomycin] Other (See Comments)    Red Man's Syndrome   Cipro [Ciprofloxacin Hcl] Nausea And Vomiting   Nitrofuran Derivatives Anxiety   Wound Dressing Adhesive Itching and Rash    Antimicrobials this admission: Cefepime x1 3/23 >>  Linezolid x1 3/23   Microbiology results: 3/23 BCx:   Thank you for allowing pharmacy to be a part of this patient's care.  Estill Batten, PharmD, BCCCP  04/27/2023 7:11 PM

## 2023-04-27 NOTE — ED Notes (Signed)
 Pt.lactic acid flagged reults to jennifer w.rn by at

## 2023-04-27 NOTE — ED Notes (Signed)
 Unable to obtain blood work or additional IV access at this time. IV team consulted

## 2023-04-27 NOTE — ED Provider Notes (Signed)
 Emergency Department Provider Note   I have reviewed the triage vital signs and the nursing notes.   HISTORY  Chief Complaint Emesis and Wrist Pain   HPI Lori [name] is a 53 y.o. female with PMH of DM, HTN, and prior CVA presents to the ED with vomiting and abdominal pain. Symptoms have been ongoing for the last several days.  There is also mention of some left wrist pain.  Family unsure if patient may have had a fall.  In terms of the vomiting they noticed that there is a nasal spray that they have at home and to stimulate appetite, exact type of spray unknown.  They find that when she uses them as she develops vomiting.  No known blood in the vomit.  Level 5 caveat: CVA/aphasia  Past Medical History:  Diagnosis Date   CAD (coronary artery disease)    Depression    Diabetes mellitus without complication (HCC)    History of CT scan    History of mammogram    History of MRI    Hypertension    Pheochromocytoma    Reversible cerebrovascular vasoconstriction syndrome    Stroke [facility])    Thyroid disease     Review of Systems  Level 5 caveat: CVA/aphasia  ____________________________________________   PHYSICAL EXAM:  VITAL SIGNS: ED Triage Vitals  Encounter Vitals Group     BP 04/27/23 1527 99/61     Pulse Rate 04/27/23 1527 (!) 120     Resp 04/27/23 1527 18     Temp 04/27/23 1527 98.1 F (36.7 C)     Temp Source 04/27/23 1527 Oral     SpO2 04/27/23 1527 98 %     Weight 04/27/23 1528 185 lb 3 oz (84 kg)     Height 04/27/23 1528 5\' 2"  (1.575 m)   Constitutional: Alert with baseline aphasia. Able to nod yes/no to answer questions and participate with exam.  Eyes: Conjunctivae are normal.  Head: Atraumatic. Nose: No congestion/rhinnorhea. Mouth/Throat: Mucous membranes are moist.  Neck: No stridor.  Cardiovascular: Normal rate, regular rhythm. Good peripheral circulation. Grossly normal heart sounds.   Respiratory: Normal respiratory  effort.  No retractions. Lungs CTAB. Gastrointestinal: Soft with mild lower abdominal tenderness. No distention.  Musculoskeletal: Tenderness to palpation of the left wrist with mild swelling. No erythema or warmth to the joint.  Neurologic:  Baseline aphasia. Left hand contracture.  Skin:  Skin is warm, dry and intact. No rash noted.   ____________________________________________   LABS (all labs ordered are listed, but only abnormal results are displayed)  Labs Reviewed  COMPREHENSIVE METABOLIC PANEL - Abnormal; Notable for the following components:      Result Value   CO2 19 (*)    Glucose, Bld 331 (*)    BUN 37 (*)    Creatinine, Ser 3.94 (*)    Total Protein 8.3 (*)    GFR, Estimated 13 (*)    Anion gap 19 (*)    All other components within normal limits  CBC - Abnormal; Notable for the following components:   WBC 10.7 (*)    RBC 5.12 (*)    RDW 17.4 (*)    All other components within normal limits  I-STAT CG4 LACTIC ACID, ED - Abnormal; Notable for the following components:   Lactic Acid, Venous 4.3 (*)    All other components within normal limits  CULTURE, BLOOD (ROUTINE X 2)  CULTURE, BLOOD (ROUTINE X 2)  LIPASE, BLOOD  URINALYSIS, ROUTINE  W REFLEX MICROSCOPIC  RAPID URINE DRUG SCREEN, HOSP PERFORMED  URINALYSIS, ROUTINE W REFLEX MICROSCOPIC  BETA-HYDROXYBUTYRIC ACID  I-STAT CG4 LACTIC ACID, ED   ____________________________________________  EKG  Sinus tachycardia. No STEMI.   ____________________________________________  RADIOLOGY  CT ABDOMEN PELVIS WO CONTRAST Result Date: 04/27/2023 CLINICAL DATA:  Abdominal pain EXAM: CT ABDOMEN AND PELVIS WITHOUT CONTRAST TECHNIQUE: Multidetector CT imaging of the abdomen and pelvis was performed following the standard protocol without IV contrast. RADIATION DOSE REDUCTION: This exam was performed according to the departmental dose-optimization program which includes automated exposure control, adjustment of the mA  and/or kV according to patient size and/or use of iterative reconstruction technique. COMPARISON:  CT 02/04/2023 FINDINGS: Lower chest: Lung bases are clear. Coronary vascular calcifications. Small hiatal hernia Hepatobiliary: No focal liver abnormality is seen. No gallstones, gallbladder wall thickening, or biliary dilatation. Pancreas: Coarse pancreatic calcifications suggestive of chronic pancreatitis. No acute inflammation Spleen: Normal in size without focal abnormality. Adrenals/Urinary Tract: History of right adrenalectomy. Left adrenal gland demonstrates mild diffuse thickening but no mass. No hydronephrosis. The bladder is unremarkable. Stomach/Bowel: Stomach is within normal limits. Appendix appears normal. No evidence of bowel wall thickening, distention, or inflammatory changes. Vascular/Lymphatic: Aortic atherosclerosis. No enlarged abdominal or pelvic lymph nodes. Reproductive: Hysterectomy.  No adnexal mass Other: Negative for pelvic effusion or free air. Lobulated soft tissue density at the low midline anterior pelvis is chronic and may relate to chronic scarring. Musculoskeletal: No acute osseous abnormality IMPRESSION: 1. No CT evidence for acute intra-abdominal or pelvic abnormality. 2. Small hiatal hernia. 3. Findings suggestive of chronic pancreatitis. No acute inflammatory changes about the pancreas 4. Aortic atherosclerosis. Electronically Signed   By: Jasmine Pang M.D.   On: 04/27/2023 17:17   DG Wrist Complete Left Result Date: 04/27/2023 CLINICAL DATA:  Wrist pain EXAM: LEFT WRIST - COMPLETE 3+ VIEW COMPARISON:  12/20/2021 FINDINGS: Bony demineralization. 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 The orientation of the scaphoid contributes to bony superimposition and makes it difficult to fully assess. No definite fracture identified. IMPRESSION: 1. Scapholunate angle about 7 degrees, suspicious for volar intercalated segmental  instability (VISI). 2. Bony demineralization. Electronically Signed   By: Gaylyn Rong M.D.   On: 04/27/2023 16:59    ____________________________________________   PROCEDURES  Procedure(s) performed:   Procedures  CRITICAL CARE Performed by: Maia Plan Total critical care time: 35 minutes Critical care time was exclusive of separately billable procedures and treating other patients. Critical care was necessary to treat or prevent imminent or life-threatening deterioration. Critical care was time spent personally by me on the following activities: development of treatment plan with patient and/or surrogate as well as nursing, discussions with consultants, evaluation of patient's response to treatment, examination of patient, obtaining history from patient or surrogate, ordering and performing treatments and interventions, ordering and review of laboratory studies, ordering and review of radiographic studies, pulse oximetry and re-evaluation of patient's condition.  Alona Bene, MD Emergency Medicine  ____________________________________________   INITIAL IMPRESSION / ASSESSMENT AND PLAN / ED COURSE  Pertinent labs & imaging results that were available during my care of the patient were reviewed by me and considered in my medical decision making (see chart for details).   This patient is Presenting for Evaluation of abdominal pain, which does require a range of treatment options, and is a complaint that involves a high risk of morbidity and mortality.  The Differential Diagnoses includes but is not exclusive to acute cholecystitis,  intrathoracic causes for epigastric abdominal pain, gastritis, duodenitis, pancreatitis, small bowel or large bowel obstruction, abdominal aortic aneurysm, hernia, gastritis, etc.   Critical Interventions-    Medications  0.9 %  sodium chloride infusion (has no administration in time range)  sodium chloride 0.9 % bolus 1,000 mL (1,000 mLs  Intravenous New Bag/Given 04/27/23 1556)  ondansetron (ZOFRAN) injection 4 mg (4 mg Intravenous Given 04/27/23 1557)  sodium chloride 0.9 % bolus 1,000 mL (1,000 mLs Intravenous New Bag/Given 04/27/23 1643)    Reassessment after intervention: No vomiting here in the ED.    I did obtain Additional Historical Information from EMS.   I decided to review pertinent External Data, and in summary patient appears to be at mental status baseline from review of prior notes.    Clinical Laboratory Tests Ordered, included CBC with minimal leukocytosis. Lactic acidosis noted to 4.3 likely in the setting of acute kidney injury with creatinine to 3.94.  Doubt sepsis at this situation. No infection source, fever, or other symptoms.   The patient is noted to have a lactate>4. With the current information available to me, I don't think the patient is in septic shock. The lactate>4, is related to OTHER SHOCK AKI .  Radiologic Tests Ordered, included wrist XR and CT abdomen/pelvis. I independently interpreted the images and agree with radiology interpretation.   Cardiac Monitor Tracing which shows sinus tachycardia.    Social Determinants of Health Risk patient is a non-smoker.   Consult complete with [facility]. Plan for admit.   Hand surgery, Renaye Rakers. Will place in thumb spica splint. If admitted, they can consult.   Medical Decision Making: Summary:  Patient presents to the emergency department with apparent abdominal pain as well as wrist discomfort, likely from injury.  X-ray shows abnormal scapholunate angle.  Splint applied and hand surgery to follow. Doubt sepsis clinically. Patient with AKI in the setting of vomiting and GI losses. Suspect lactic acidosis in the setting.   Reevaluation with update and discussion with patient. Plan for admit. Ct reassuring.   Patient's presentation is most consistent with acute presentation with potential threat to life or bodily function.   Disposition:  admit  ____________________________________________  FINAL CLINICAL IMPRESSION(S) / ED DIAGNOSES  Final diagnoses:  AKI (acute kidney injury) (HCC)  Left wrist pain    Note:  This document was prepared using Dragon voice recognition software and may include unintentional dictation errors.  Alona Bene, MD, [facility] Emergency Medicine    Famous Eisenhardt, Arlyss Repress, MD 04/27/23 [phone]

## 2023-04-27 NOTE — ED Triage Notes (Signed)
 Pt to ER via EMS from home with c/o vomiting.  Reports by family that pt started a new nasal spray to help with "digestion" and every time she takes the spray she vomits.  Pt also c/o left wrist pain, possible recent fall.  Pt has hx of multiple CVA with speech and left sided deficits. CBG 238.

## 2023-04-27 NOTE — ED Notes (Signed)
 Report given to [facility], informed of next CBG per endotool and reported units of insulin gtt. Pt transferred monitored, with RN, without issues.

## 2023-04-28 DIAGNOSIS — E1110 Type 2 diabetes mellitus with ketoacidosis without coma: Principal | ICD-10-CM

## 2023-04-28 DIAGNOSIS — N179 Acute kidney failure, unspecified: Secondary | ICD-10-CM | POA: Diagnosis not present

## 2023-04-28 LAB — BASIC METABOLIC PANEL
Anion gap: 14 (ref 5–15)
Anion gap: 14 (ref 5–15)
BUN: 36 mg/dL — ABNORMAL HIGH (ref 6–20)
BUN: 33 mg/dL — ABNORMAL HIGH (ref 6–20)
CO2: 17 mmol/L — ABNORMAL LOW (ref 22–32)
CO2: 20 mmol/L — ABNORMAL LOW (ref 22–32)
Calcium: 9.2 mg/dL (ref 8.9–10.3)
Calcium: 8.8 mg/dL — ABNORMAL LOW (ref 8.9–10.3)
Chloride: 109 mmol/L (ref 98–111)
Chloride: 107 mmol/L (ref 98–111)
Creatinine, Ser: 3.03 mg/dL — ABNORMAL HIGH (ref 0.44–1.00)
Creatinine, Ser: 2.93 mg/dL — ABNORMAL HIGH (ref 0.44–1.00)
GFR, Estimated: 18 mL/min — ABNORMAL LOW (ref >60)
GFR, Estimated: 19 mL/min — ABNORMAL LOW (ref >60)
Glucose, Bld: 127 mg/dL — ABNORMAL HIGH (ref 70–99)
Glucose, Bld: 134 mg/dL — ABNORMAL HIGH (ref 70–99)
Potassium: 4 mmol/L (ref 3.5–5.1)
Potassium: 3.7 mmol/L (ref 3.5–5.1)
Sodium: 140 mmol/L (ref 135–145)
Sodium: 141 mmol/L (ref 135–145)

## 2023-04-28 LAB — BETA-HYDROXYBUTYRIC ACID
Beta-Hydroxybutyric Acid: 0.25 mmol/L (ref 0.05–0.27)
Beta-Hydroxybutyric Acid: 0.33 mmol/L — ABNORMAL HIGH (ref 0.05–0.27)

## 2023-04-28 LAB — GLUCOSE, CAPILLARY
Glucose-Capillary: 134 mg/dL — ABNORMAL HIGH (ref 70–99)
Glucose-Capillary: 156 mg/dL — ABNORMAL HIGH (ref 70–99)
Glucose-Capillary: 145 mg/dL — ABNORMAL HIGH (ref 70–99)
Glucose-Capillary: 152 mg/dL — ABNORMAL HIGH (ref 70–99)
Glucose-Capillary: 146 mg/dL — ABNORMAL HIGH (ref 70–99)
Glucose-Capillary: 150 mg/dL — ABNORMAL HIGH (ref 70–99)
Glucose-Capillary: 144 mg/dL — ABNORMAL HIGH (ref 70–99)
Glucose-Capillary: 161 mg/dL — ABNORMAL HIGH (ref 70–99)
Glucose-Capillary: 127 mg/dL — ABNORMAL HIGH (ref 70–99)
Glucose-Capillary: 118 mg/dL — ABNORMAL HIGH (ref 70–99)
Glucose-Capillary: 116 mg/dL — ABNORMAL HIGH (ref 70–99)
Glucose-Capillary: 119 mg/dL — ABNORMAL HIGH (ref 70–99)

## 2023-04-28 LAB — LACTIC ACID, PLASMA: Lactic Acid, Venous: 5.6 mmol/L (ref 0.5–1.9)

## 2023-04-28 MED ORDER — INSULIN ASPART 100 UNIT/ML IJ SOLN
0.0000 [IU] | INTRAMUSCULAR | Status: DC
  Administered 2023-04-28: 1 [IU] via SUBCUTANEOUS
  Administered 2023-04-28: 2 [IU] via SUBCUTANEOUS

## 2023-04-28 MED ORDER — INSULIN GLARGINE-YFGN 100 UNIT/ML ~~LOC~~ SOLN
10.0000 [IU] | Freq: Every day | SUBCUTANEOUS | Status: DC
  Administered 2023-04-29 – 2023-05-01 (×3): 10 [IU] via SUBCUTANEOUS
  Filled 2023-04-28 (×3): qty 0.1

## 2023-04-28 MED ORDER — LACTATED RINGERS IV SOLN: INTRAVENOUS | Status: DC

## 2023-04-28 MED ORDER — CHLORHEXIDINE GLUCONATE CLOTH 2 % EX PADS
6.0000 | MEDICATED_PAD | Freq: Every day | CUTANEOUS | Status: DC
  Administered 2023-04-28 – 2023-04-30 (×3): 6 via TOPICAL

## 2023-04-28 MED ORDER — PANTOPRAZOLE SODIUM 40 MG IV SOLR
40.0000 mg | Freq: Two times a day (BID) | INTRAVENOUS | Status: DC
  Administered 2023-04-28 – 2023-04-29 (×2): 40 mg via INTRAVENOUS
  Filled 2023-04-28 (×2): qty 10

## 2023-04-28 MED ORDER — LORAZEPAM 2 MG/ML IJ SOLN
0.5000 mg | Freq: Two times a day (BID) | INTRAMUSCULAR | Status: DC | PRN
  Administered 2023-04-29: 0.5 mg via INTRAVENOUS
  Filled 2023-04-28: qty 1

## 2023-04-28 MED ORDER — METOPROLOL TARTRATE 5 MG/5ML IV SOLN: 5.0000 mg | Freq: Four times a day (QID) | INTRAVENOUS | Status: DC | PRN

## 2023-04-28 MED ORDER — INSULIN GLARGINE 100 UNIT/ML ~~LOC~~ SOLN
10.0000 [IU] | Freq: Every day | SUBCUTANEOUS | Status: DC
  Administered 2023-04-28: 10 [IU] via SUBCUTANEOUS
  Filled 2023-04-28: qty 0.1

## 2023-04-28 NOTE — Progress Notes (Signed)
 Pt remains agitated. MD notified. Ativan 0.5 ml PRN Q6hrs agitation order received, Ativan administered as ordered.

## 2023-04-28 NOTE — Evaluation (Addendum)
 Physical Therapy Evaluation Patient Details Name: Galena Logie Neclos-Becton MRN: [id number] DOB: 12/11/1970 Today's Date: 04/28/2023  History of Present Illness  53 y/o female presents to [facility] on 03/23  secondary to emesis and wrist pain, she is very poor historian, currently confused. 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 PMH includes:  type 2 diabetes mellitus with gastroparesis, CAD status post CABG in [place], chronic diastolic heart failure, OSA on CPAP, history of CVA with residual left-sided weakness and currently bedbound, history of DVT on Xarelto, pheochromocytoma status post right adrenalectomy, hypothyroidism.  Clinical Impression  Prior to recent admission to [facility], patient lives at home with her husbands and required assistance for ADLs/IADLs due to being bedbound. Patient currently presents with decreased strength, decreased ROM, decreased tolerance with functional activities, and impaired speech. Deferred performing transfers or additional functional tasks due to patient being tachycardiac when alerted and aroused with passive movements (140-150bpm). Therapy rolled patient for pressure relief and to assess for skin breakdown. Therapy provided PROM interventions for bilateral LEs and R hand to address contractures. Patient will benefit from continued inpatient follow up therapy, <3 hours/day to maximize her functional independence. Acute PT will continue to follow-up.       If plan is discharge home, recommend the following: Two people to help with walking and/or transfers;A lot of help with bathing/dressing/bathroom   Can travel by private vehicle   No    Equipment Recommendations Hoyer lift  Recommendations for Other Services       Functional Status Assessment Patient has had a recent decline in their  functional status and/or demonstrates limited ability to make significant improvements in function in a reasonable and predictable amount of time     Precautions / Restrictions Precautions Precautions: Fall Recall of Precautions/Restrictions: Impaired Required Braces or Orthoses: Splint/Cast Splint/Cast: L thumb/wrist Restrictions Weight Bearing Restrictions Per Provider Order: No      Mobility  Bed Mobility Overal bed mobility: Needs Assistance Bed Mobility: Rolling Rolling: Total assist         General bed mobility comments: Pt visually reports pain in her back when rolling, no pressure wounds observed on pressure points    Transfers                        Ambulation/Gait                  Stairs            Wheelchair Mobility     Tilt Bed    Modified Rankin (Stroke Patients Only)       Balance                                             Pertinent Vitals/Pain Pain Assessment Pain Assessment: Faces Faces Pain Scale: Hurts whole lot Pain Descriptors / Indicators: Grimacing, Crying Pain Intervention(s): Limited activity within patient's tolerance    Home Living Family/patient expects to be discharged to:: Unsure                   Additional Comments: Patient unable to recall, no family members present    Prior Function Prior Level of Function : Patient poor historian/Family not available  Extremity/Trunk Assessment   Upper Extremity Assessment Upper Extremity Assessment: Defer to OT evaluation    Lower Extremity Assessment Lower Extremity Assessment: RLE deficits/detail;LLE deficits/detail RLE Deficits / Details: Extensor tone vs guarding, able to passively flex knee with significant time/effort LLE Deficits / Details: Hx residual deficits from stroke, increased L foot inversion       Communication   Communication Communication: Impaired Factors Affecting  Communication: Reduced clarity of speech    Cognition Arousal: Lethargic Behavior During Therapy: Agitated   PT - Cognitive impairments: No family/caregiver present to determine baseline, Difficult to assess Difficult to assess due to: Impaired communication, Level of arousal                     PT - Cognition Comments: Patient able to respond with "yes or no", however some inconsistencies with verbalizations due to level of arousal and impaired communication Following commands: Impaired Following commands impaired: Follows one step commands inconsistently     Cueing Cueing Techniques: Verbal cues, Tactile cues     General Comments General comments (skin integrity, edema, etc.): Tachycardiac when aroused/alerted: 140-150bpm, BP 132/93 (106    Exercises Other Exercises Other Exercises: PROM for Bilateral LEs and R Hand   Assessment/Plan    PT Assessment Patient needs continued PT services  PT Problem List Decreased strength;Decreased range of motion;Decreased activity tolerance;Decreased balance;Decreased mobility;Decreased coordination;Decreased cognition;Impaired tone;Pain       PT Treatment Interventions DME instruction;Gait training;Stair training;Functional mobility training;Therapeutic activities;Therapeutic exercise;Balance training;Neuromuscular re-education;Cognitive remediation    PT Goals (Current goals can be found in the Care Plan section)  Acute Rehab PT Goals Patient Stated Goal: Unable to gather PT Goal Formulation: Patient unable to participate in goal setting Time For Goal Achievement: 05/12/23 Potential to Achieve Goals: Fair    Frequency Min 1X/week     Co-evaluation               AM-PAC PT "6 Clicks" Mobility  Outcome Measure Help needed turning from your back to your side while in a flat bed without using bedrails?: Total Help needed moving from lying on your back to sitting on the side of a flat bed without using bedrails?:  Total Help needed moving to and from a bed to a chair (including a wheelchair)?: Total Help needed standing up from a chair using your arms (e.g., wheelchair or bedside chair)?: Total Help needed to walk in hospital room?: Total Help needed climbing 3-5 steps with a railing? : Total 6 Click Score: 6    End of Session Equipment Utilized During Treatment: Gait belt Activity Tolerance: Patient limited by pain;Patient limited by lethargy Patient left: in bed;with bed alarm set;with call bell/phone within reach Nurse Communication: Mobility status PT Visit Diagnosis: Other abnormalities of gait and mobility (R26.89);Muscle weakness (generalized) (M62.81);History of falling (Z91.81);Difficulty in walking, not elsewhere classified (R26.2);Other symptoms and signs involving the nervous system (R29.898);Hemiplegia and hemiparesis;Pain Hemiplegia - Right/Left: Left Hemiplegia - caused by: Unspecified Pain - Right/Left:  (Bilateral) Pain - part of body: Hand;Leg    Time: 1610-9604 PT Time Calculation (min) (ACUTE ONLY): 27 min   Charges:   PT Evaluation $PT Eval Moderate Complexity: 1 Mod PT Treatments $Therapeutic Activity: 8-22 mins PT General Charges $$ ACUTE PT VISIT: 1 Visit       Doreen Beam, SPT   Loveah Like 04/28/2023, 11:14 AM

## 2023-04-28 NOTE — Progress Notes (Signed)
 Pt remains agitated, with loss of IV access. MD notified of critical Lactic acid lab value, and also agitation, and loss of access. IV Team inserted two PIV lines, but were all lost. MD notified.MD also notified of SBP of 66/44, MAP 56. RAPID Team notified. IV access reinserted, maintenance  fluid restored.

## 2023-04-28 NOTE — Progress Notes (Signed)
 Pt previous IVs started by IV Team infiltrated. Unable to use right upper arm at this time due to infiltration. No restrictions on left forearm/upper arm per primary RN. 2 unsuccessful attempts to obtain IV by this RN. Pt veins too deep for midline placement in left upper arm. Primary RN aware and states MD will order PICC.

## 2023-04-28 NOTE — Plan of Care (Deleted)
  Problem: Acute Rehab PT Goals(only PT should resolve) Goal: Pt Will Go Supine/Side To Sit Flowsheets (Taken 04/28/2023 1110) Pt will go Supine/Side to Sit: with minimal assist Goal: Patient Will Perform Sitting Balance Flowsheets (Taken 04/28/2023 1110) Patient will perform sitting balance:  with minimal assist  1-2 min  with bilateral UE support Goal: Patient Will Transfer Sit To/From Stand Flowsheets (Taken 04/28/2023 1110) Patient will transfer sit to/from stand: with minimal assist Goal: Pt Will Transfer Bed To Chair/Chair To Bed Flowsheets (Taken 04/28/2023 1110) Pt will Transfer Bed to Chair/Chair to Bed: with min assist Goal: Pt Will Ambulate Flowsheets (Taken 04/28/2023 1110) Pt will Ambulate:  with minimal assist  with least restrictive assistive device  15 feet Goal: Pt Will Verbalize and Adhere to Precautions While Description: PT Will Verbalize and Adhere to Precautions While Performing Mobility Flowsheets (Taken 04/28/2023 1110) Pt will verbalize and adhere to precautions while performing mobility: (Fall) --

## 2023-04-28 NOTE — Plan of Care (Signed)
  Problem: Acute Rehab PT Goals(only PT should resolve) Goal: Pt Will Go Supine/Side To Sit Flowsheets (Taken 04/28/2023 1110) Pt will go Supine/Side to Sit: with minimal assist   Problem: Acute Rehab PT Goals(only PT should resolve) Goal: Patient Will Perform Sitting Balance Flowsheets (Taken 04/28/2023 1110) Patient will perform sitting balance:  with minimal assist  1-2 min  with bilateral UE support   Problem: Acute Rehab PT Goals(only PT should resolve) Goal: Patient Will Transfer Sit To/From Stand 04/28/2023 1115 by Newman Pies, Student-PT Flowsheets (Taken 04/28/2023 1115) Patient will transfer sit to/from stand:  with maximum assist  with +2 04/28/2023 1110 by Newman Pies, Student-PT Flowsheets (Taken 04/28/2023 1110) Patient will transfer sit to/from stand: with minimal assist   Problem: Acute Rehab PT Goals(only PT should resolve) Goal: Pt Will Transfer Bed To Chair/Chair To Bed 04/28/2023 1115 by Newman Pies, Student-PT Flowsheets (Taken 04/28/2023 1115) Pt will Transfer Bed to Chair/Chair to Bed:  with max assist  with +2 04/28/2023 1110 by Newman Pies, Student-PT Flowsheets (Taken 04/28/2023 1110) Pt will Transfer Bed to Chair/Chair to Bed: with min assist   Problem: Acute Rehab PT Goals(only PT should resolve) Goal: Pt Will Verbalize and Adhere to Precautions While Description: PT Will Verbalize and Adhere to Precautions While Performing Mobility Flowsheets (Taken 04/28/2023 1110) Pt will verbalize and adhere to precautions while performing mobility: (Fall) --

## 2023-04-28 NOTE — Evaluation (Signed)
 Occupational Therapy Evaluation Patient Details Name: Lori [name] MRN: [id number] DOB: [birth year] Today's Date: 04/28/2023   History of Present Illness   53 y/o female presents to [facility] on 03/23 secondary to emesis and wrist pain; poor historian, currently confused. 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 PMH includes: DMII with gastroparesis, CAD status post CABG in [place], chronic diastolic heart failure, OSA on CPAP, history of CVA with residual left-sided weakness and currently bedbound, history of DVT on Xarelto, pheochromocytoma status post right adrenalectomy, hypothyroidism.     Clinical Impressions Pt evaluated s/p the admission list above. At baseline, pt lives at home with spouse, is bed bound, and requires assistance for ADLs/IADLs. Pt reports pivoting to wheelchair with assistance but unable to determine accuracy due to cognitive impairments. Upon evaluation, pt was limited by pain, impaired cognition, L side residual deficits from previous stroke, and tachycardia. Pt required MAX A+2 for safety when completing bed mobility tasks with good initiation observed in movement of BLE towards EOB. Pt unable to attempt standing on this date due to HR increasing to 140s with activity. Pt required Hand over hand to MAX A to complete grooming tasks at bed level requiring verbal cues to attend and engage. Based on evaluation, pt will require up to TOTAL A for ADLs. OT to continue following pt acutely to address functional needs with discharge recommendations of inpatient therapy services <3hrs/day to maximize functional independence.     If plan is discharge home, recommend the following:   Two people to help with walking and/or transfers;A lot of help with bathing/dressing/bathroom;Assistance with  cooking/housework;Assistance with feeding;Direct supervision/assist for medications management;Direct supervision/assist for financial management;Assist for transportation;Help with stairs or ramp for entrance;Supervision due to cognitive status     Functional Status Assessment   Patient has had a recent decline in their functional status and demonstrates the ability to make significant improvements in function in a reasonable and predictable amount of time.     Equipment Recommendations   Other (comment) (defer)     Recommendations for Other Services         Precautions/Restrictions   Precautions Precautions: Fall Recall of Precautions/Restrictions: Impaired Precaution/Restrictions Comments: Tachy when alerted and aroused Required Braces or Orthoses: Splint/Cast Restrictions Weight Bearing Restrictions Per Provider Order: No     Mobility Bed Mobility Overal bed mobility: Needs Assistance Bed Mobility: Supine to Sit, Sit to Supine           General bed mobility comments: Pt with good initiation moving BLE towards EOB but expressed increased pain in abdomen when attempting to elevate trunk to upright position.    Transfers Overall transfer level: Needs assistance                 General transfer comment: DNT for safety concerns; Pt HR increasing to 140s while seated EOB      Balance Overall balance assessment: Needs assistance Sitting-balance support: Single extremity supported, Feet supported Sitting balance-Leahy Scale: Fair Sitting balance - Comments: static sitting EOB                                   ADL either performed or assessed with clinical judgement   ADL Overall ADL's : Needs assistance/impaired Eating/Feeding: NPO   Grooming: Maximal assistance;Sitting;Cueing for sequencing   Upper Body Bathing: Moderate assistance;Bed  level   Lower Body Bathing: Maximal assistance;Bed level   Upper Body Dressing : Maximal  assistance;Bed level   Lower Body Dressing: Total assistance;Bed level   Toilet Transfer: Total assistance;Stand-pivot;BSC/3in1   Toileting- Clothing Manipulation and Hygiene: Total assistance;Sitting/lateral lean       Functional mobility during ADLs: Maximal assistance;+2 for physical assistance General ADL Comments: Pt inconsistently responds to one step verbal commands. Pt with periods of agitation during session increase HR to 140s. Pt with L side residual deficits in LUE impairing functional use     Vision Baseline Vision/History: 0 No visual deficits Ability to See in Adequate Light: 0 Adequate Patient Visual Report: No change from baseline Vision Assessment?: No apparent visual deficits     Perception Perception: Not tested       Praxis Praxis: Not tested       Pertinent Vitals/Pain Pain Assessment Pain Assessment: Faces Faces Pain Scale: Hurts whole lot Pain Location: L hand/wrist and abdomen Pain Descriptors / Indicators: Grimacing, Guarding Pain Intervention(s): Limited activity within patient's tolerance, Monitored during session     Extremity/Trunk Assessment Upper Extremity Assessment Upper Extremity Assessment: LUE deficits/detail LUE Deficits / Details: residual weakness and L hand contracture from previous stroke. Pt keeps LUE adducted and reports being unable to extend elbow. Pt expressed severe pain with PROM to L hand LUE: Unable to fully assess due to pain LUE Coordination: decreased fine motor;decreased gross motor   Lower Extremity Assessment Lower Extremity Assessment: Defer to PT evaluation       Communication Communication Communication: Impaired Factors Affecting Communication: Difficulty expressing self;Reduced clarity of speech   Cognition Arousal: Lethargic Behavior During Therapy: Agitated Cognition: No family/caregiver present to determine baseline, Cognition impaired, Difficult to assess Difficult to assess due to: Impaired  communication Orientation impairments: Situation Awareness: Intellectual awareness impaired, Online awareness impaired   Attention impairment (select first level of impairment): Focused attention Executive functioning impairment (select all impairments): Initiation, Organization, Sequencing, Reasoning, Problem solving OT - Cognition Comments: Pt able to state name correctly. Pt echoed name briefly by repeating name ~6xs when asked birthdate. Pt able to state birthdate and place. Pt with increased agitation during session, followed verbal commands inconsistently and required verbal cues to attend and engage                 Following commands: Impaired Following commands impaired: Follows one step commands inconsistently     Cueing  General Comments   Cueing Techniques: Gestural cues;Verbal cues;Tactile cues  Tachycardiac-HR increasing to 140s   Exercises     Shoulder Instructions      Home Living Family/patient expects to be discharged to:: Unsure Living Arrangements: Spouse/significant other Available Help at Discharge: Family;Available PRN/intermittently;Personal care attendant Type of Home: Apartment Home Access: Level entry     Home Layout: One level     Bathroom Shower/Tub: Chief Strategy Officer: Standard     Home Equipment: Hospital bed;Wheelchair - manual;Tub bench   Additional Comments: Patient unable to recall, no family members present. Information gathered from previous admission      Prior Functioning/Environment Prior Level of Function : Needs assist;Patient poor historian/Family not available  Cognitive Assist : Mobility (cognitive);ADLs (cognitive)     Physical Assist : ADLs (physical);Mobility (physical) Mobility (physical): Bed mobility;Transfers ADLs (physical): Grooming;Bathing;Dressing;Toileting;IADLs Mobility Comments: Pt reports standing and pivoting to and from wheelchair with assist ADLs Comments: Pt unable to accurately  state PLOF but reports state pt was mainly bedbound    OT Problem List:  Decreased strength;Decreased range of motion;Decreased activity tolerance;Impaired balance (sitting and/or standing);Decreased coordination;Decreased cognition;Decreased safety awareness;Impaired UE functional use;Pain   OT Treatment/Interventions: Self-care/ADL training;Therapeutic exercise;Energy conservation;DME and/or AE instruction;Splinting;Therapeutic activities;Cognitive remediation/compensation;Patient/family education;Balance training      OT Goals(Current goals can be found in the care plan section)   Acute Rehab OT Goals Patient Stated Goal: none stated OT Goal Formulation: Patient unable to participate in goal setting Time For Goal Achievement: 05/12/23 Potential to Achieve Goals: Fair ADL Goals Pt Will Perform Grooming: with set-up;sitting Pt Will Perform Upper Body Dressing: with mod assist;sitting Pt Will Perform Lower Body Dressing: with max assist Pt Will Transfer to Toilet: with mod assist;stand pivot transfer;bedside commode Pt/caregiver will Perform Home Exercise Program: Increased ROM;Left upper extremity;With minimal assist;With written HEP provided Additional ADL Goal #1: Pt will follow one-step verbal commands consistently to improve independence in functional tasks   OT Frequency:  Min 2X/week    Co-evaluation              AM-PAC OT "6 Clicks" Daily Activity     Outcome Measure Help from another person eating meals?: Total Help from another person taking care of personal grooming?: A Lot Help from another person toileting, which includes using toliet, bedpan, or urinal?: Total Help from another person bathing (including washing, rinsing, drying)?: A Lot Help from another person to put on and taking off regular upper body clothing?: A Lot Help from another person to put on and taking off regular lower body clothing?: Total 6 Click Score: 9   End of Session Nurse Communication:  Mobility status  Activity Tolerance: Treatment limited secondary to medical complications (Comment) (tachycardiac) Patient left: in bed;with call bell/phone within reach;with bed alarm set  OT Visit Diagnosis: Muscle weakness (generalized) (M62.81);Unsteadiness on feet (R26.81)                Time: 0865-7846 OT Time Calculation (min): 27 min Charges:  OT General Charges $OT Visit: 1 Visit OT Evaluation $OT Eval Moderate Complexity: 1 Mod OT Treatments $Self Care/Home Management : 8-22 mins  [address], [place]  Lori [name] 04/28/2023, 5:59 PM

## 2023-04-28 NOTE — Progress Notes (Addendum)
 PROGRESS NOTE    Lori [name]  NFA:[id number] DOB: 1970/09/16 DOA: 04/27/2023 PCP: Caesar Bookman, NP    Brief Narrative:   Lori [name] is a 53 y.o. female with past medical history significant for DM2 gastroparesis, CAD s/p CABG, chronic diastolic congestive heart failure, history of CVA with residual left-sided weakness/bedbound status, DVT on Xarelto, pheochromocytoma s/p right adrenalectomy, hypothyroidism, OSA on CPAP who presented to [facility] ED on from home via EMS with nausea and vomiting, left wrist pain and recent fall.  Patient confused, poor historian and majority of HPI obtained from husband via telephone, previous medical records and ED staff.  Apparently patient was started on nasal spray last Friday, husband reports been having poor appetite over the last few days with development of severe nausea, vomiting with 7-8 episodes over the last 1-2 days.  Denies any coffee-ground emesis, no diarrhea, no melena.  In the ED, temperature 98.1 F, HR 120, RR 18, BP 99/61, SpO2 98% on room air.  WBC 10.7, hemoglobin 13.4, platelet count 343.  Sodium 137, potassium 4.8, chloride 99, CO2 19, glucose 331, BUN 37, creatinine 3.94.  AST 22, ALT 19, total bilirubin 0.7.  Lipase 27.  Lactic acid 4.3.  Beta hydroxybutyrate acid 1.96.  Urinalysis with trace leukocytes, negative nitrite, many bacteria, 0-5 WBCs.  UDS negative.  Chest x-ray with no active cardiopulmonary disease process.  Left wrist x-ray with scapholunate angle about 7 degrees, suspicious for volar intercalated segmental instability, bony demineralization.  CT abdomen/pelvis without contrast with no acute intra-abdominal/pelvic abnormality, small hiatal hernia, findings suggestive of chronic pancreatitis with no acute inflammatory changes about the pancreas, aortic atherosclerosis.  EDP discussed with orthopedics, Dr. Eulah Pont who recommended splint and will evaluate.  TRH consulted for  admission.  Assessment & Plan:   Diabetic ketoacidosis Type 2 diabetes mellitus with gastroparesis Lactic acidosis Patient presenting to ED with nausea and vomiting over the last 1-2 days.  Patient with elevated glucose of 343, anion gap 19 and beta hydroxybutyrate acid elevated at 1.96 with lactic acid 4.3.  No findings of infectious process elucidated with negative chest x-ray and no acute findings on CT abdomen/pelvis.  UDS negative.  Initially placed on insulin drip which was transition back to subcutaneous insulin once anion gap closed with normalization of glucose and beta hydroxybutyrate acid. -- Lantus 10 units [place] daily (Home on Lantus 14u [place] daily with Novolog SSI) -- Sensitive SSI for coverage -- CBGs q4h while NPO -- NPO pending SLP evaluation and improvement of mental status -- LR at 75 mL/h -- Zofran 4 mg IV every 6 hours as needed nausea/vomiting -- BMP, repeat lactic acid in a.m.  Acute renal failure Patient presenting with elevated creatinine of 3.94.  Likely secondary to prerenal azotemia/dehydration in the setting of DKA/nausea and vomiting as above.  Baseline creatinine 0.8-1.0. 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 twice daily at baseline. -- Holding oral metoprolol given n.p.o. status -- Metoprolol tartrate 5 mg IV every 6 hours as needed sustained heart rate greater than 120 Thomson 2 minutes -- Monitor on telemetry  Acute metabolic encephalopathy Patient presenting with confusion in the setting  of significant nausea and vomiting from DKA as above with lactic acidosis.  History of CVA with residual  right-sided weakness Patient is apparently bedbound/wheelchair dependent at baseline.  From home with husband. -- PT/OT evaluation  Hyperthyroidism -- Check TSH, free T4 -- holding home methimazole due to n.p.o. status  HLD -- Hold home atorvastatin and Zetia total demonstrates ability to adequately swallow  History of DVT on anticoagulation -- Holding home Xarelto until able to tolerate oral intake  Major depressive disorder Anxiety --Continue to hold home Lexapro and Ativan until tolerates oral intake  GERD On Protonix 40 mg p.o. twice daily and Carafate at baseline. -- Holding oral Protonix/Carafate for now -- Protonix 40mg  IV q12h  OSA -- Will hold CPAP until mental status, nausea and vomiting improves -- VBG in the a.m. to check CO2 level  CAD s/p CABG Continue to hold home Ranexa,Metoprolol, Plavix until demonstrates ability to adequately intake oral intake.  Hx pheochromocytoma s/p right adrenalectomy   DVT prophylaxis: heparin injection 5,000 Units Start: 04/27/23 2200    Code Status: Full Code Family Communication: No family present at bedside this morning  Disposition Plan:  Level of care: Progressive Status is: Inpatient Remains inpatient appropriate because: IV fluid hydration, transitioning insulin drip to subcutaneous insulin today    Consultants:  Orthopedics  Procedures:  None  Antimicrobials:  Linezolid 3/23 - 3/23 Cefepime 3/23 - 3/23   Subjective: Patient seen examined bedside, lying in bed.  Remains confused.  Poorly interactive.  No family present.  RN present at bedside.  Keeps repeating "my husband is coming".  Seen by SLP this morning, deferring assessment until mental status improves.  Creatinine slowly improving.  Remains on IV fluids.  Transitioning off of insulin drip this morning.  Unable to obtain any further ROS due to her current mental status.  No other acute concerns this morning per nursing staff.  Objective: Vitals:    04/27/23 2326 04/27/23 2332 04/28/23 0325 04/28/23 0820  BP: (!) 68/46 (!) 118/44  132/78  Pulse: (!) 104 (!) 113  94  Resp: 10 (!) 23  16  Temp:   97.6 F (36.4 C)   TempSrc:   Axillary   SpO2: 98% 99%  99%  Weight:      Height:        Intake/Output Summary (Last 24 hours) at 04/28/2023 1026 Last data filed at 04/28/2023 0400 Gross per 24 hour  Intake 4353.93 ml  Output 350 ml  Net 4003.93 ml   Filed Weights   04/27/23 1528  Weight: 84 kg    Examination:  Physical Exam: GEN: NAD, poorly interactive, confused HEENT: NCAT, PERRL, EOMI, sclera clear, dry mucous membranes PULM: CTAB w/o wheezes/crackles, normal respiratory effort without accessory muscle use, on room air with SpO2 98% at rest CV: Tachycardic, regular rhythm w/o M/G/R GI: abd soft, NTND, + BS MSK: no peripheral edema, left wrist with splint in place Integumentary: No concerning rashes/lesions/wounds nonexposed skin surfaces    Data Reviewed: I have personally reviewed following labs and imaging studies  CBC: Recent Labs  Lab 04/25/23 1430 04/27/23 1542  WBC 8.3 10.7*  NEUTROABS 5,437  --   HGB 14.4 13.4  HCT 46.3* 41.0  MCV 84.3 80.1  PLT 281 343   Basic Metabolic Panel: Recent Labs  Lab 04/25/23 1430 04/27/23 1542 04/27/23 2200 04/28/23 0102 04/28/23 0223  NA 135 137 139 140 141  K 5.0 4.8 3.7 4.0 3.7  CL 100 99 106 109 107  CO2  22 19* 16* 17* 20*  GLUCOSE 369* 331* 226* 127* 134*  BUN 24 37* 40* 36* 33*  CREATININE 1.01 3.94* 3.52* 3.03* 2.93*  CALCIUM 10.2 10.1 9.3 9.2 8.8*   GFR: Estimated Creatinine Clearance: 23.1 mL/min (A) (by C-G formula based on SCr of 2.93 mg/dL (H)). Liver Function Tests: Recent Labs  Lab 04/25/23 1430 04/27/23 1542  AST 13 22  ALT 18 19  ALKPHOS  --  83  BILITOT 0.4 0.7  PROT 8.0 8.3*  ALBUMIN  --  4.1   Recent Labs  Lab 04/27/23 1542  LIPASE 27   No results for input(s): "AMMONIA" in the last 168 hours. Coagulation Profile: No results  for input(s): "INR", "PROTIME" in the last 168 hours. Cardiac Enzymes: No results for input(s): "CKTOTAL", "CKMB", "CKMBINDEX", "TROPONINI" in the last 168 hours. BNP (last 3 results) No results for input(s): "PROBNP" in the last 8760 hours. HbA1C: Recent Labs    04/25/23 1430  HGBA1C 10.0*   CBG: Recent Labs  Lab 04/28/23 0350 04/28/23 0456 04/28/23 0604 04/28/23 0715 04/28/23 0801  GLUCAP 152* 146* 150* 144* 161*   Lipid Profile: Recent Labs    04/25/23 1430  CHOL 273*  HDL 55  LDLCALC 187*  TRIG 165*  CHOLHDL 5.0*   Thyroid Function Tests: Recent Labs    04/25/23 1430  TSH 0.66   Anemia Panel: No results for input(s): "VITAMINB12", "FOLATE", "FERRITIN", "TIBC", "IRON", "RETICCTPCT" in the last 72 hours. Sepsis Labs: Recent Labs  Lab 04/27/23 1604 04/27/23 2200 04/28/23 0102  LATICACIDVEN 4.3* 5.0* 5.6*    Recent Results (from the past 240 hours)  Culture, blood (Routine X 2) w Reflex to ID Panel     Status: None (Preliminary result)   Collection Time: 04/27/23  8:04 PM   Specimen: BLOOD  Result Value Ref Range Status   Specimen Description BLOOD SITE NOT SPECIFIED  Final   Special Requests   Final    BOTTLES DRAWN AEROBIC AND ANAEROBIC Blood Culture results may not be optimal due to an inadequate volume of blood received in culture bottles   Culture   Final    NO GROWTH < 12 HOURS Performed at [facility], [address], [place], [place] [address]    Report Status PENDING  Incomplete  Culture, blood (Routine X 2) w Reflex to ID Panel     Status: None (Preliminary result)   Collection Time: 04/27/23 11:04 PM   Specimen: BLOOD  Result Value Ref Range Status   Specimen Description BLOOD BLOOD RIGHT HAND  Final   Special Requests   Final    BOTTLES DRAWN AEROBIC ONLY Blood Culture results may not be optimal due to an inadequate volume of blood received in culture bottles   Culture   Final    NO GROWTH < 12 HOURS Performed at [facility], [address], [place], [place] [address]    Report Status PENDING  Incomplete         Radiology Studies: DG Chest Port 1 View Result Date: 04/27/2023 CLINICAL DATA:  Lactic acidosis EXAM: PORTABLE CHEST 1 VIEW COMPARISON:  04/09/2023 FINDINGS: Post sternotomy changes. No acute airspace disease or effusion. Stable cardiomediastinal silhouette with aortic atherosclerosis. No pneumothorax IMPRESSION: No active disease. Electronically Signed   By: Jasmine Pang M.D.   On: 04/27/2023 18:22   CT ABDOMEN PELVIS WO CONTRAST Result Date: 04/27/2023 CLINICAL DATA:  Abdominal pain EXAM: CT ABDOMEN AND PELVIS WITHOUT CONTRAST TECHNIQUE: Multidetector CT  imaging of the abdomen and pelvis was performed following the standard protocol without IV contrast. RADIATION DOSE REDUCTION: This exam was performed according to the departmental dose-optimization program which includes automated exposure control, adjustment of the mA and/or kV according to patient size and/or use of iterative reconstruction technique. COMPARISON:  CT 02/04/2023 FINDINGS: Lower chest: Lung bases are clear. Coronary vascular calcifications. Small hiatal hernia Hepatobiliary: No focal liver abnormality is seen. No gallstones, gallbladder wall thickening, or biliary dilatation. Pancreas: Coarse pancreatic calcifications suggestive of chronic pancreatitis. No acute inflammation Spleen: Normal in size without focal abnormality. Adrenals/Urinary Tract: History of right adrenalectomy. Left adrenal gland demonstrates mild diffuse thickening but no mass. No hydronephrosis. The bladder is unremarkable. Stomach/Bowel: Stomach is within normal limits. Appendix appears normal. No evidence of bowel wall thickening, distention, or inflammatory changes. Vascular/Lymphatic: Aortic atherosclerosis. No enlarged abdominal or pelvic lymph nodes. Reproductive: Hysterectomy.  No adnexal mass Other: Negative for pelvic effusion or free air. Lobulated soft  tissue density at the low midline anterior pelvis is chronic and may relate to chronic scarring. Musculoskeletal: No acute osseous abnormality IMPRESSION: 1. No CT evidence for acute intra-abdominal or pelvic abnormality. 2. Small hiatal hernia. 3. Findings suggestive of chronic pancreatitis. No acute inflammatory changes about the pancreas 4. Aortic atherosclerosis. Electronically Signed   By: Jasmine Pang M.D.   On: 04/27/2023 17:17   DG Wrist Complete Left Result Date: 04/27/2023 CLINICAL DATA:  Wrist pain EXAM: LEFT WRIST - COMPLETE 3+ VIEW COMPARISON:  12/20/2021 FINDINGS: Bony demineralization. 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 The orientation of the scaphoid contributes to bony superimposition and makes it difficult to fully assess. No definite fracture identified. IMPRESSION: 1. 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 2. Bony demineralization. Electronically Signed   By: Gaylyn Rong M.D.   On: 04/27/2023 16:59        Scheduled Meds:  heparin injection (subcutaneous)  5,000 Units Subcutaneous Q8H   insulin aspart  0-9 Units Subcutaneous Q4H   insulin glargine  10 Units Subcutaneous Daily   pantoprazole (PROTONIX) IV  40 mg Intravenous Q24H   Continuous Infusions:  lactated ringers 75 mL/hr at 04/28/23 0955     LOS: 1 day    Time spent: 53 minutes spent on 04/28/2023 caring for this patient face-to-face including chart review, ordering labs/tests, documenting, discussion with nursing staff, consultants, updating family and interview/physical exam    Alvira Philips [place], DO [facility] Available via Epic secure chat 7am-7pm After these hours, please refer to coverage provider listed on [email or web address] 04/28/2023, 10:26 AM

## 2023-04-28 NOTE — Progress Notes (Signed)
 SLP Cancellation Note  Patient Details Name: Lori [name] MRN: [id number] DOB: [birth year]   Cancelled treatment:       Reason Eval/Treat Not Completed: Fatigue/lethargy limiting ability to participate. SLP at bedside. Pt did not wake to verbal/tactile stimulation at this time. SLP will re-attempt clinical swallow evaluation once pt is alert and able to safely participate in assessment.    [name] 04/28/2023, 8:35 AM

## 2023-04-29 DIAGNOSIS — N179 Acute kidney failure, unspecified: Secondary | ICD-10-CM | POA: Diagnosis not present

## 2023-04-29 DIAGNOSIS — E1110 Type 2 diabetes mellitus with ketoacidosis without coma: Secondary | ICD-10-CM | POA: Diagnosis not present

## 2023-04-29 LAB — BLOOD GAS, VENOUS
Acid-Base Excess: 0.5 mmol/L (ref 0.0–2.0)
Bicarbonate: 27 mmol/L (ref 20.0–28.0)
O2 Saturation: 90.8 %
Patient temperature: 37
pCO2, Ven: 50 mmHg (ref 44–60)
pH, Ven: 7.34 (ref 7.25–7.43)
pO2, Ven: 59 mmHg — ABNORMAL HIGH (ref 32–45)

## 2023-04-29 LAB — COMPREHENSIVE METABOLIC PANEL
ALT: 13 U/L (ref 0–44)
AST: 14 U/L — ABNORMAL LOW (ref 15–41)
Albumin: 3 g/dL — ABNORMAL LOW (ref 3.5–5.0)
Alkaline Phosphatase: 58 U/L (ref 38–126)
Anion gap: 9 (ref 5–15)
BUN: 25 mg/dL — ABNORMAL HIGH (ref 6–20)
CO2: 25 mmol/L (ref 22–32)
Calcium: 9.1 mg/dL (ref 8.9–10.3)
Chloride: 110 mmol/L (ref 98–111)
Creatinine, Ser: 1.46 mg/dL — ABNORMAL HIGH (ref 0.44–1.00)
GFR, Estimated: 44 mL/min — ABNORMAL LOW (ref >60)
Glucose, Bld: 103 mg/dL — ABNORMAL HIGH (ref 70–99)
Potassium: 3.8 mmol/L (ref 3.5–5.1)
Sodium: 144 mmol/L (ref 135–145)
Total Bilirubin: 0.6 mg/dL (ref 0.0–1.2)
Total Protein: 6.3 g/dL — ABNORMAL LOW (ref 6.5–8.1)

## 2023-04-29 LAB — GLUCOSE, CAPILLARY
Glucose-Capillary: 99 mg/dL (ref 70–99)
Glucose-Capillary: 95 mg/dL (ref 70–99)
Glucose-Capillary: 107 mg/dL — ABNORMAL HIGH (ref 70–99)
Glucose-Capillary: 132 mg/dL — ABNORMAL HIGH (ref 70–99)
Glucose-Capillary: 143 mg/dL — ABNORMAL HIGH (ref 70–99)

## 2023-04-29 LAB — CBC
HCT: 33 % — ABNORMAL LOW (ref 36.0–46.0)
Hemoglobin: 10.8 g/dL — ABNORMAL LOW (ref 12.0–15.0)
MCH: 26.5 pg (ref 26.0–34.0)
MCHC: 32.7 g/dL (ref 30.0–36.0)
MCV: 80.9 fL (ref 80.0–100.0)
Platelets: 232 10*3/uL (ref 150–400)
RBC: 4.08 MIL/uL (ref 3.87–5.11)
RDW: 17.2 % — ABNORMAL HIGH (ref 11.5–15.5)
WBC: 12.6 10*3/uL — ABNORMAL HIGH (ref 4.0–10.5)
nRBC: 0 % (ref 0.0–0.2)

## 2023-04-29 LAB — PHOSPHORUS: Phosphorus: 3.5 mg/dL (ref 2.5–4.6)

## 2023-04-29 LAB — LACTIC ACID, PLASMA: Lactic Acid, Venous: 1.5 mmol/L (ref 0.5–1.9)

## 2023-04-29 LAB — TSH: TSH: 0.475 u[IU]/mL (ref 0.350–4.500)

## 2023-04-29 LAB — T4, FREE: Free T4: 1.06 ng/dL (ref 0.61–1.12)

## 2023-04-29 LAB — MAGNESIUM: Magnesium: 1.7 mg/dL (ref 1.7–2.4)

## 2023-04-29 MED ORDER — PANTOPRAZOLE SODIUM 40 MG PO TBEC
40.0000 mg | DELAYED_RELEASE_TABLET | Freq: Two times a day (BID) | ORAL | Status: DC
  Administered 2023-04-29 – 2023-05-01 (×4): 40 mg via ORAL
  Filled 2023-04-29 (×5): qty 1

## 2023-04-29 MED ORDER — EZETIMIBE 10 MG PO TABS
10.0000 mg | ORAL_TABLET | Freq: Every day | ORAL | Status: DC
  Administered 2023-04-29 – 2023-05-01 (×3): 10 mg via ORAL
  Filled 2023-04-29 (×3): qty 1

## 2023-04-29 MED ORDER — LACTATED RINGERS IV SOLN: INTRAVENOUS | Status: AC

## 2023-04-29 MED ORDER — ATORVASTATIN CALCIUM 80 MG PO TABS
80.0000 mg | ORAL_TABLET | Freq: Every day | ORAL | Status: DC
  Administered 2023-04-29 – 2023-05-01 (×3): 80 mg via ORAL
  Filled 2023-04-29 (×3): qty 1

## 2023-04-29 MED ORDER — RIVAROXABAN 10 MG PO TABS
10.0000 mg | ORAL_TABLET | Freq: Every day | ORAL | Status: DC
  Administered 2023-04-29 – 2023-05-01 (×3): 10 mg via ORAL
  Filled 2023-04-29 (×3): qty 1

## 2023-04-29 MED ORDER — ESCITALOPRAM OXALATE 10 MG PO TABS
20.0000 mg | ORAL_TABLET | Freq: Every morning | ORAL | Status: DC
  Administered 2023-04-29 – 2023-05-01 (×3): 20 mg via ORAL
  Filled 2023-04-29 (×3): qty 2

## 2023-04-29 MED ORDER — METHIMAZOLE 5 MG PO TABS
5.0000 mg | ORAL_TABLET | Freq: Every morning | ORAL | Status: DC
  Administered 2023-04-29 – 2023-05-01 (×3): 5 mg via ORAL
  Filled 2023-04-29 (×3): qty 1

## 2023-04-29 MED ORDER — CLOPIDOGREL BISULFATE 75 MG PO TABS
75.0000 mg | ORAL_TABLET | Freq: Every day | ORAL | Status: DC
  Administered 2023-04-29 – 2023-05-01 (×3): 75 mg via ORAL
  Filled 2023-04-29 (×3): qty 1

## 2023-04-29 MED ORDER — TOPIRAMATE 25 MG PO TABS
25.0000 mg | ORAL_TABLET | Freq: Two times a day (BID) | ORAL | Status: DC
  Administered 2023-04-29 – 2023-05-01 (×5): 25 mg via ORAL
  Filled 2023-04-29 (×5): qty 1

## 2023-04-29 MED ORDER — INSULIN ASPART 100 UNIT/ML IJ SOLN
0.0000 [IU] | Freq: Three times a day (TID) | INTRAMUSCULAR | Status: DC
  Administered 2023-04-29 – 2023-05-01 (×3): 1 [IU] via SUBCUTANEOUS
  Administered 2023-05-01: 2 [IU] via SUBCUTANEOUS

## 2023-04-29 MED ORDER — GABAPENTIN 100 MG PO CAPS
100.0000 mg | ORAL_CAPSULE | Freq: Three times a day (TID) | ORAL | Status: DC
  Administered 2023-04-29 – 2023-05-01 (×7): 100 mg via ORAL
  Filled 2023-04-29 (×7): qty 1

## 2023-04-29 MED ORDER — TAMSULOSIN HCL 0.4 MG PO CAPS
0.4000 mg | ORAL_CAPSULE | Freq: Every day | ORAL | Status: DC
  Administered 2023-04-29 – 2023-04-30 (×2): 0.4 mg via ORAL
  Filled 2023-04-29 (×2): qty 1

## 2023-04-29 NOTE — Care Management Important Message (Signed)
 Important Message  Patient Details  Name: Lori [name] MRN: [id number] Date of Birth: 1970-02-23   Important Message Given:  Yes - Medicare IM     Chania Kochanski 04/29/2023, 11:41 AM

## 2023-04-29 NOTE — TOC Initial Note (Signed)
 Transition of Care [facility]) - Initial/Assessment Note    Patient Details  Name: Lori [name] MRN: [id number] Date of Birth: [birth year]  Transition of Care [facility]) CM/SW Contact:    Eduard Roux, LCSW Phone Number: 04/29/2023, 11:36 AM  Clinical Narrative:                 Per chart review patient is alert to self only. CSW contacted her spouse, Lori [name] - CSW introduced self and explained role. CSW discussed recommendation for short term rehab at [facility]. CSW was given permission to send out SNF referrals. Preferred SNF is [facility]. All questions answered.   TOC will provide bed offers once available  TOC will continue to follow and assist with discharge planning.  Antony Blackbird, MSW, LCSW Clinical Social Worker    Expected Discharge Plan: Skilled Nursing Facility Barriers to Discharge: English as a second language teacher, Continued Medical Work up   Patient Goals and CMS Choice            Expected Discharge Plan and Services In-house Referral: Clinical Social Work     Living arrangements for the past 2 months: Single Family Home                                      Prior Living Arrangements/Services Living arrangements for the past 2 months: Single Family Home Lives with:: Self, Spouse Patient language and need for interpreter reviewed:: No        Need for Family Participation in Patient Care: Yes (Comment) Care giver support system in place?: Yes (comment)   Criminal Activity/Legal Involvement Pertinent to Current Situation/Hospitalization: No - Comment as needed  Activities of Daily Living   ADL Screening (condition at time of admission) Independently performs ADLs?: No Does the patient have a NEW difficulty with bathing/dressing/toileting/self-feeding that is expected to last >3 days?: No Does the patient have a NEW difficulty with getting in/out of bed, walking, or climbing stairs that is expected to last >3 days?: No Does the patient  have a NEW difficulty with communication that is expected to last >3 days?: No Is the patient deaf or have difficulty hearing?: No Does the patient have difficulty seeing, even when wearing glasses/contacts?: No Does the patient have difficulty concentrating, remembering, or making decisions?: Yes  Permission Sought/Granted   Permission granted to share information with :  (patient only alert to self- spoke with spouse)              Emotional Assessment       Orientation: : Oriented to Self Alcohol / Substance Use: Not Applicable Psych Involvement: No (comment)  Admission diagnosis:  Left wrist pain [M25.532] Acute renal failure (ARF) (HCC) [N17.9] AKI (acute kidney injury) (HCC) [N17.9] Patient Active Problem List   Diagnosis Date Noted   Acute renal failure (ARF) (HCC) 04/27/2023   Gastrointestinal hemorrhage 02/13/2023   Anxiety and depression 02/13/2023   Insulin dependent type 2 diabetes mellitus (HCC) 02/13/2023   Hypomagnesemia 02/06/2023   Metabolic acidosis 02/06/2023   Acute esophagitis 02/06/2023   Adenomatous duodenal polyp 02/06/2023   Gastric polyp 02/06/2023   COVID-19 virus infection 02/05/2023   Hematemesis 02/04/2023   Anticoagulated 02/04/2023   Urinary incontinence 01/22/2023   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   Diabetic hyperosmolar non-ketotic state (HCC) 01/19/2023   Cerebrovascular disease 01/03/2023   Hypokalemia 01/02/2023   Thrombocytosis 01/02/2023   Microcytic anemia 01/02/2023   OSA  on CPAP 01/02/2023   Acute cystitis 12/26/2022   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 12/25/2022   History of DVT (deep vein thrombosis) 12/25/2022   S/P total hysterectomy 12/24/2022   Delirium 11/22/2022   Acute encephalopathy 11/04/2022   Antiplatelet or antithrombotic long-term use 11/04/2022   Hypotension 11/04/2022   Acute metabolic encephalopathy 11/04/2022   Morbid obesity (HCC) 10/13/2022   Aspiration pneumonia (HCC) 10/13/2022   Chronic  diastolic CHF (congestive heart failure) (HCC) 10/13/2022   Chronic anemia 10/13/2022   Acute pancreatitis 10/11/2022   Chest pain of uncertain etiology 10/06/2022   Coronary artery disease with chronic angina 10/06/2022   Generalized anxiety disorder 10/06/2022   Reactive airway disease 10/06/2022   UTI (urinary tract infection) 07/04/2022   Mild intermittent asthma, uncomplicated 07/04/2022   Lethargic 05/01/2022   Hyponatremia 04/30/2022   Hyperkalemia 04/30/2022   AKI (acute kidney injury) (HCC) 04/30/2022   Wheelchair dependence 04/08/2022   Spasticity as late effect of cerebrovascular accident (CVA) 04/08/2022   Left hemiplegia (HCC) 04/08/2022   Dyspnea 11/02/2021   GERD (gastroesophageal reflux disease) 11/02/2021   Nausea and vomiting 08/25/2021   Acute bronchitis 08/24/2021   Hypothyroidism 07/10/2021   Functional urinary incontinence 05/17/2021   MDD (major depressive disorder), recurrent episode, severe (HCC) 04/09/2021   Diabetic gastroparesis (HCC) 03/16/2021   Migraines 03/16/2021   History of CVA with residual deficit    Debility 02/26/2021   DKA (diabetic ketoacidosis) (HCC) 02/19/2021   Weakness    Type 2 diabetes mellitus with hyperlipidemia (HCC)    Aphasia    Spell of abnormal behavior    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 01/19/2021   Tachycardia 01/19/2021   CAD (coronary artery disease) 01/19/2021   Spastic hemiparesis of left 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 (HCC) 01/19/2021   Hyperthyroidism    Essential hypertension    Pheochromocytoma    Depression    Hyperlipidemia    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   Noncompliance 10/19/2016   History of pheochromocytoma 10/25/2015   Carotid stenosis, bilateral 10/24/2015   Hemiplegia, unspecified affecting right dominant side (HCC) 07/18/2015   Geographic tongue 07/07/2015   PCP:  Caesar Bookman, NP Pharmacy:   [facility]  [phone] - [place], [place] - [address] AT [facility] [address] [place] [address] Phone: [phone] Fax: [phone]  [facility] - [place], [place] - [address] [place] [address] Phone: [phone] Fax: [phone]  [facility] - [address] [place], [place] - [address] [place] [address] Phone: [phone] Fax: [phone]  [facility] - [place], [place] - [address] [place] [address] Phone: [phone] Fax: [phone]     Social Drivers of Health (SDOH) Social History: SDOH Screenings   Food Insecurity: Patient Unable To Answer (04/28/2023)  Housing: Patient Unable To Answer (04/28/2023)  Transportation Needs: Patient Unable To Answer (04/28/2023)  Utilities: Patient Unable To Answer (04/28/2023)  Depression (PHQ2-9): Low Risk  (04/03/2023)  Tobacco Use: Low Risk  (04/27/2023)   SDOH Interventions:     Readmission Risk Interventions    12/28/2022   11:30 AM 11/05/2022    1:38 PM 10/15/2022   10:25 AM  Readmission Risk Prevention Plan  Transportation Screening Complete Complete Complete  Medication Review Oceanographer) Complete Complete  PCP or Specialist appointment within 3-5 days of discharge Complete Complete Complete  HRI or Home Care Consult Complete Complete Complete  SW Recovery Care/Counseling Consult Complete Complete Complete  Palliative Care Screening Not Applicable Not Applicable Not Applicable  Skilled Nursing Facility Not Applicable Not Applicable Not Applicable

## 2023-04-29 NOTE — [place] FL2 (Signed)
 [place] MEDICAID [place]2 LEVEL OF CARE FORM     IDENTIFICATION  Patient Name: Lori [name] Birthdate: Jul 31, 1970 Sex: female Admission Date (Current Location): 04/27/2023  [facility] and [place] Number:  Producer, television/film/video and Address:  The . [facility], [address] [place], [place] [address]      Provider Number: [id number]  Attending Physician Name and Address:  U[place], Eric J, DO  Relative Name and Phone Number:       Current Level of Care: Hospital Recommended Level of Care: Skilled Nursing Facility Prior Approval Number:    Date Approved/Denied:   PASRR Number: [id number]  Discharge Plan: SNF    Current Diagnoses: Patient Active Problem List   Diagnosis Date Noted   Acute renal failure (ARF) (HCC) 04/27/2023   Gastrointestinal hemorrhage 02/13/2023   Anxiety and depression 02/13/2023   Insulin dependent type 2 diabetes mellitus (HCC) 02/13/2023   Hypomagnesemia 02/06/2023   Metabolic acidosis 02/06/2023   Acute esophagitis 02/06/2023   Adenomatous duodenal polyp 02/06/2023   Gastric polyp 02/06/2023   COVID-19 virus infection 02/05/2023   Hematemesis 02/04/2023   Anticoagulated 02/04/2023   Urinary incontinence 01/22/2023   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   Diabetic hyperosmolar non-ketotic state (HCC) 01/19/2023   Cerebrovascular disease 01/03/2023   Hypokalemia 01/02/2023   Thrombocytosis 01/02/2023   Microcytic anemia 01/02/2023   OSA on CPAP 01/02/2023   Acute cystitis 12/26/2022   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 12/25/2022   History of DVT (deep vein thrombosis) 12/25/2022   S/P total hysterectomy 12/24/2022   Delirium 11/22/2022   Acute encephalopathy 11/04/2022   Antiplatelet or antithrombotic long-term use 11/04/2022   Hypotension 11/04/2022   Acute metabolic encephalopathy 11/04/2022   Morbid obesity (HCC) 10/13/2022   Aspiration pneumonia (HCC) 10/13/2022   Chronic diastolic CHF  (congestive heart failure) (HCC) 10/13/2022   Chronic anemia 10/13/2022   Acute pancreatitis 10/11/2022   Chest pain of uncertain etiology 10/06/2022   Coronary artery disease with chronic angina 10/06/2022   Generalized anxiety disorder 10/06/2022   Reactive airway disease 10/06/2022   UTI (urinary tract infection) 07/04/2022   Mild intermittent asthma, uncomplicated 07/04/2022   Lethargic 05/01/2022   Hyponatremia 04/30/2022   Hyperkalemia 04/30/2022   AKI (acute kidney injury) (HCC) 04/30/2022   Wheelchair dependence 04/08/2022   Spasticity as late effect of cerebrovascular accident (CVA) 04/08/2022   Left hemiplegia (HCC) 04/08/2022   Dyspnea 11/02/2021   GERD (gastroesophageal reflux disease) 11/02/2021   Nausea and vomiting 08/25/2021   Acute bronchitis 08/24/2021   Hypothyroidism 07/10/2021   Functional urinary incontinence 05/17/2021   MDD (major depressive disorder), recurrent episode, severe (HCC) 04/09/2021   Diabetic gastroparesis (HCC) 03/16/2021   Migraines 03/16/2021   History of CVA with residual deficit    Debility 02/26/2021   DKA (diabetic ketoacidosis) (HCC) 02/19/2021   Weakness    Type 2 diabetes mellitus with hyperlipidemia (HCC)    Aphasia    Spell of abnormal behavior    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 01/19/2021   Tachycardia 01/19/2021   CAD (coronary artery disease) 01/19/2021   Spastic hemiparesis of left 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 (HCC) 01/19/2021   Hyperthyroidism    Essential hypertension    Pheochromocytoma    Depression    Hyperlipidemia    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   Noncompliance 10/19/2016   History of pheochromocytoma 10/25/2015   Carotid stenosis, bilateral 10/24/2015   Hemiplegia,  unspecified affecting right dominant side (HCC) 07/18/2015   Geographic tongue 07/07/2015    Orientation RESPIRATION BLADDER Height & Weight     Self  Normal  Incontinent, External catheter Weight: 185 lb 3 oz (84 kg) Height:  5\' 2"  (157.5 cm)  BEHAVIORAL SYMPTOMS/MOOD NEUROLOGICAL BOWEL NUTRITION STATUS      Continent Diet  AMBULATORY STATUS COMMUNICATION OF NEEDS Skin   Extensive Assist Verbally Normal                       Personal Care Assistance Level of Assistance  Bathing, Feeding, Dressing Bathing Assistance: Maximum assistance Feeding assistance: Limited assistance Dressing Assistance: Maximum assistance     Functional Limitations Info  Sight, Hearing, Speech Sight Info: Adequate Hearing Info: Adequate Speech Info: Adequate    SPECIAL CARE FACTORS FREQUENCY  PT (By licensed PT), OT (By licensed OT)     PT Frequency: 5x per week OT Frequency: 5x per week            Contractures Contractures Info: Not present    Additional Factors Info  Code Status, Allergies Code Status Info: FULL Allergies Info: Allergies Info: Asa (Aspirin), Contrast Media (Iodinated Contrast Media), Imitrex (Sumatriptan), Penicillins, Firvanq (Vancomycin), Cipro (Ciprofloxacin Hcl), Ms Contin (Morphine), Wound Dressing Adhesive           Current Medications (04/29/2023):  This is the current hospital active medication list Current Facility-Administered Medications  Medication Dose Route Frequency Provider Last Rate Last Admin   acetaminophen (TYLENOL) tablet 650 mg  650 mg Oral Q6H PRN Elgergawy, Leana Roe, MD       Or   acetaminophen (TYLENOL) suppository 650 mg  650 mg Rectal Q6H PRN Elgergawy, Leana Roe, MD       albuterol (PROVENTIL) (2.5 MG/3ML) 0.083% nebulizer solution 2.5 mg  2.5 mg Nebulization Q2H PRN Elgergawy, Leana Roe, MD       atorvastatin (LIPITOR) tablet 80 mg  80 mg Oral Daily [place], Alvira Philips, DO   80 mg at 04/29/23 1143   bisacodyl (DULCOLAX) suppository 10 mg  10 mg Rectal Daily PRN Elgergawy, Leana Roe, MD       Chlorhexidine Gluconate Cloth 2 % PADS 6 each  6 each Topical Q0600 [place], Eric J, DO   6 each at 04/29/23  0419   clopidogrel (PLAVIX) tablet 75 mg  75 mg Oral Daily [place], Eric J, DO   75 mg at 04/29/23 1143   dextrose 50 % solution 0-50 mL  0-50 mL Intravenous PRN Elgergawy, Leana Roe, MD       escitalopram (LEXAPRO) tablet 20 mg  20 mg Oral q morning [place], Alvira Philips, DO   20 mg at 04/29/23 1143   ezetimibe (ZETIA) tablet 10 mg  10 mg Oral Daily [place], Alvira Philips, DO   10 mg at 04/29/23 1143   gabapentin (NEURONTIN) capsule 100 mg  100 mg Oral TID [place], Eric J, DO   100 mg at 04/29/23 1143   HYDROmorphone (DILAUDID) injection 0.5 mg  0.5 mg Intravenous Q4H PRN Elgergawy, Leana Roe, MD   0.5 mg at 04/29/23 0523   insulin aspart (novoLOG) injection 0-9 Units  0-9 Units Subcutaneous TID WC [place], Eric J, DO       insulin glargine-yfgn [facility]) injection 10 Units  10 Units Subcutaneous Daily [facility] D[facility], [facility]   10 Units at 04/29/23 5366   lactated ringers infusion   Intravenous Continuous [place], Eric J, DO 75 mL/hr at 04/29/23 4403 Restarted  at 04/29/23 0716   LORazepam (ATIVAN) injection 0.5 mg  0.5 mg Intravenous Q12H PRN [place], Eric J, DO   0.5 mg at 04/29/23 1610   methimazole (TAPAZOLE) tablet 5 mg  5 mg Oral q morning [place], Eric J, DO   5 mg at 04/29/23 1143   metoprolol tartrate (LOPRESSOR) injection 5 mg  5 mg Intravenous Q6H PRN [place], Eric J, DO       ondansetron [facility]) tablet 4 mg  4 mg Oral Q6H PRN Elgergawy, Leana Roe, MD       Or   ondansetron (ZOFRAN) injection 4 mg  4 mg Intravenous Q6H PRN Elgergawy, Leana Roe, MD   4 mg at 04/27/23 2206   pantoprazole (PROTONIX) EC tablet 40 mg  40 mg Oral BID [place], Eric J, DO       polyethylene glycol (MIRALAX / GLYCOLAX) packet 17 g  17 g Oral Daily PRN Elgergawy, Leana Roe, MD       rivaroxaban (XARELTO) tablet 10 mg  10 mg Oral Daily [place], Alvira Philips, DO   10 mg at 04/29/23 1143   tamsulosin (FLOMAX) capsule 0.4 mg  0.4 mg Oral QHS [place], Eric J, DO       topiramate (TOPAMAX) tablet 25 mg  25 mg Oral BID [place], Eric J, DO    25 mg at 04/29/23 1143     Discharge Medications: Please see discharge summary for a list of discharge medications.  Relevant Imaging Results:  Relevant Lab Results:   Additional Information SSN [id number]  Eduard Roux, LCSW

## 2023-04-29 NOTE — Progress Notes (Signed)
 Pt's Social Worker called. Per Child psychotherapist, said pt's husband called regarding the arrange of pt going to [facility] SNF. She is concerned because pt has HH services. Social worker is requesting TOC CM/SW Antony Blackbird to give her a call. Her contact information : Lonia Mad, [phone].

## 2023-04-29 NOTE — Progress Notes (Signed)
 PROGRESS NOTE    Lori [name] [name]  ZOX:[id number] DOB: 1970-12-09 DOA: 04/27/2023 PCP: Lori Bookman, NP    Brief Narrative:   Lori [name] is a 53 y.o. female with past medical history significant for DM2 gastroparesis, CAD s/p CABG, chronic diastolic congestive heart failure, history of CVA with residual left-sided weakness/bedbound status, DVT on Xarelto, pheochromocytoma s/p right adrenalectomy, hypothyroidism, OSA on CPAP who presented to [facility] ED on from home via EMS with nausea and vomiting, left wrist pain and recent fall.  Patient confused, poor historian and majority of HPI obtained from husband via telephone, previous medical records and ED staff.  Apparently patient was started on nasal spray last Friday, husband reports been having poor appetite over the last few days with development of severe nausea, vomiting with 7-8 episodes over the last 1-2 days.  Denies any coffee-ground emesis, no diarrhea, no melena.  In the ED, temperature 98.1 F, HR 120, RR 18, BP 99/61, SpO2 98% on room air.  WBC 10.7, hemoglobin 13.4, platelet count 343.  Sodium 137, potassium 4.8, chloride 99, CO2 19, glucose 331, BUN 37, creatinine 3.94.  AST 22, ALT 19, total bilirubin 0.7.  Lipase 27.  Lactic acid 4.3.  Beta hydroxybutyrate acid 1.96.  Urinalysis with trace leukocytes, negative nitrite, many bacteria, 0-5 WBCs.  UDS negative.  Chest x-ray with no active cardiopulmonary disease process.  Left wrist x-ray with scapholunate angle about 7 degrees, suspicious for volar intercalated segmental instability, bony demineralization.  CT abdomen/pelvis without contrast with no acute intra-abdominal/pelvic abnormality, small hiatal hernia, findings suggestive of chronic pancreatitis with no acute inflammatory changes about the pancreas, aortic atherosclerosis.  EDP discussed with orthopedics, Dr. Eulah [name] who recommended splint and will evaluate.  TRH consulted for  admission.  Assessment & Plan:   Diabetic ketoacidosis Type 2 diabetes mellitus with gastroparesis Lactic acidosis: Resolved Patient presenting to ED with nausea and vomiting over the last 1-2 days.  Patient with elevated glucose of 343, anion gap 19 and beta hydroxybutyrate acid elevated at 1.96 with lactic acid 4.3.  No findings of infectious process elucidated with negative chest x-ray and no acute findings on CT abdomen/pelvis.  UDS negative.  Initially placed on insulin drip which was transition back to subcutaneous insulin once anion gap closed with normalization of glucose and beta hydroxybutyrate acid. -- Lactic acid 4.3>>5.6>1.5 -- Lantus 10 units [place] daily (Home on Lantus 14u [place] daily with Novolog SSI) -- Sensitive SSI for coverage -- CBGs 3 times daily AC/at bedtime -- Start clear liquid diet, pending SLP evaluation  -- LR at 75 mL/h -- Zofran 4 mg IV every 6 hours as needed nausea/vomiting -- BMP in the a.m.  Acute renal failure Patient presenting with elevated creatinine of 3.94.  Likely secondary to prerenal azotemia/dehydration in the setting of DKA/nausea and vomiting as above.  Baseline creatinine 0.8-1.0. -- Cr 3.94>3.03>2.93>1.46 -- LR at 75 mL/h -- Strict I's and O's, monitor urine output (Foley catheter placed in the ED on 3/23); consider voiding trial 3/26 -- Avoid nephrotoxins, renal dose all medications -- BMP daily  Scapholunate angle about 7 degrees suspicious for volar intercalated segmental instability (VISI) -- Orthopedic surgery consulted -- Continue wrist splint  Sinus tachycardia On metoprolol tartrate 100 mg p.o. twice daily at baseline. -- Holding oral metoprolol given borderline hypotension -- Metoprolol tartrate 5 mg IV every 6 hours as needed sustained heart rate greater than 120 Lori [name] 2 minutes -- Monitor on telemetry  Acute metabolic  encephalopathy: Improving Patient presenting with confusion in the setting of significant nausea and vomiting  from DKA as above with lactic acidosis.  History of CVA with residual right-sided weakness Patient is apparently bedbound/wheelchair dependent at baseline.  From home with husband. -- PT/OT evaluation recommend SNF placement -- TOC consulted for placement  Hyperthyroidism -- TSH 0.475, free T41.06. -- Restart methimazole 5 mg p.o. daily  HLD -- Atorvastatin 80 mg p.o. daily, Zetia 10 mg p.o. daily  History of DVT on anticoagulation -- Xarelto 10 mg p.o. daily  Major depressive disorder Anxiety -- Lexapro 20 mg p.o. daily -- Lorazepam 0.5 mg IV every 12 hours as needed anxiety  GERD On Protonix 40 mg p.o. twice daily and Carafate at baseline. -- Protonix 40 mg p.o. twice daily  OSA -- Nocturnal CPAP  CAD s/p CABG --Continue Plavix 75 mg p.o. daily -- Continue to hold home Ranexa, Metoprolol given borderline low blood pressures.  Hx pheochromocytoma s/p right adrenalectomy   DVT prophylaxis: heparin injection 5,000 Units Start: 04/27/23 2200    Code Status: Full Code Family Communication: No family present at bedside this morning  Disposition Plan:  Level of care: Progressive Status is: Inpatient Remains inpatient appropriate because: IV fluid hydration, start clear liquid diet today, awaiting SLP evaluation, will need SNF placement [facility] consulted    Consultants:  Orthopedics  Procedures:  None  Antimicrobials:  Linezolid 3/23 - 3/23 Cefepime 3/23 - 3/23   Subjective: Patient seen examined bedside, lying in bed.  Much more alert this morning, asking for "water".  Assisted patient with water this morning and no concerns for aspiration.  Discussed with RN.  Awaiting SLP evaluation.  Will need SNF placement.  Denies chest pain, no shortness of breath, no abdominal pain.  No other acute concerns this morning per nursing staff.  Objective: Vitals:   04/28/23 2000 04/28/23 2332 04/29/23 0330 04/29/23 0800  BP: 97/71 94/68 99/74  102/72  Pulse: 90 (!) 101 91 90   Resp: 14 13 15 16   Temp: 98.5 F (36.9 C) 98.6 F (37 C) 97.7 F (36.5 C) 98 F (36.7 C)  TempSrc: Oral Oral Oral Oral  SpO2: 98% 96% 99% 100%  Weight:      Height:        Intake/Output Summary (Last 24 hours) at 04/29/2023 1050 Last data filed at 04/29/2023 0414 Gross per 24 hour  Intake 1293.95 ml  Output 600 ml  Net 693.95 ml   Filed Weights   04/27/23 1528  Weight: 84 kg    Examination:  Physical Exam: GEN: NAD, alert and oriented to person/time/place, chronically ill in appearance, appears older than stated age HEENT: NCAT, PERRL, EOMI, sclera clear, dry mucous membranes PULM: CTAB w/o wheezes/crackles, normal respiratory effort without accessory muscle use, on room air with SpO2 98% at rest CV: Tachycardic, regular rhythm w/o M/G/R GI: abd soft, NTND, + BS MSK: no peripheral edema, left wrist with splint in place Integumentary: No concerning rashes/lesions/wounds nonexposed skin surfaces    Data Reviewed: I have personally reviewed following labs and imaging studies  CBC: Recent Labs  Lab 04/25/23 1430 04/27/23 1542 04/29/23 0519  WBC 8.3 10.7* 12.6*  NEUTROABS 5,437  --   --   HGB 14.4 13.4 10.8*  HCT 46.3* 41.0 33.0*  MCV 84.3 80.1 80.9  PLT 281 343 232   Basic Metabolic Panel: Recent Labs  Lab 04/27/23 1542 04/27/23 2200 04/28/23 0102 04/28/23 0223 04/29/23 0519  NA 137 139 140 141 144  K  4.8 3.7 4.0 3.7 3.8  CL 99 106 109 107 110  CO2 19* 16* 17* 20* 25  GLUCOSE 331* 226* 127* 134* 103*  BUN 37* 40* 36* 33* 25*  CREATININE 3.94* 3.52* 3.03* 2.93* 1.46*  CALCIUM 10.1 9.3 9.2 8.8* 9.1  MG  --   --   --   --  1.7  PHOS  --   --   --   --  3.5   GFR: Estimated Creatinine Clearance: 46.4 mL/min (A) (by C-G formula based on SCr of 1.46 mg/dL (H)). Liver Function Tests: Recent Labs  Lab 04/25/23 1430 04/27/23 1542 04/29/23 0519  AST 13 22 14*  ALT 18 19 13   ALKPHOS  --  83 58  BILITOT 0.4 0.7 0.6  PROT 8.0 8.3* 6.3*  ALBUMIN  --   4.1 3.0*   Recent Labs  Lab 04/27/23 1542  LIPASE 27   No results for input(s): "AMMONIA" in the last 168 hours. Coagulation Profile: No results for input(s): "INR", "PROTIME" in the last 168 hours. Cardiac Enzymes: No results for input(s): "CKTOTAL", "CKMB", "CKMBINDEX", "TROPONINI" in the last 168 hours. BNP (last 3 results) No results for input(s): "PROBNP" in the last 8760 hours. HbA1C: No results for input(s): "HGBA1C" in the last 72 hours.  CBG: Recent Labs  Lab 04/28/23 1627 04/28/23 2008 04/28/23 2324 04/29/23 0329 04/29/23 0832  GLUCAP 118* 116* 119* 99 95   Lipid Profile: No results for input(s): "CHOL", "HDL", "LDLCALC", "TRIG", "CHOLHDL", "LDLDIRECT" in the last 72 hours.  Thyroid Function Tests: Recent Labs    04/29/23 0519  TSH 0.475  FREET4 1.06   Anemia Panel: No results for input(s): "VITAMINB12", "FOLATE", "FERRITIN", "TIBC", "IRON", "RETICCTPCT" in the last 72 hours. Sepsis Labs: Recent Labs  Lab 04/27/23 1604 04/27/23 2200 04/28/23 0102 04/29/23 0519  LATICACIDVEN 4.3* 5.0* 5.6* 1.5    Recent Results (from the past 240 hours)  Culture, blood (Routine X 2) w Reflex to ID Panel     Status: None (Preliminary result)   Collection Time: 04/27/23  8:04 PM   Specimen: BLOOD  Result Value Ref Range Status   Specimen Description BLOOD SITE NOT SPECIFIED  Final   Special Requests   Final    BOTTLES DRAWN AEROBIC AND ANAEROBIC Blood Culture results may not be optimal due to an inadequate volume of blood received in culture bottles   Culture   Final    NO GROWTH 2 DAYS Performed at [facility], [address], [place], [place] [address]    Report Status PENDING  Incomplete  Culture, blood (Routine X 2) w Reflex to ID Panel     Status: None (Preliminary result)   Collection Time: 04/27/23 11:04 PM   Specimen: BLOOD  Result Value Ref Range Status   Specimen Description BLOOD BLOOD RIGHT HAND  Final   Special Requests   Final    BOTTLES  DRAWN AEROBIC ONLY Blood Culture results may not be optimal due to an inadequate volume of blood received in culture bottles   Culture   Final    NO GROWTH 2 DAYS Performed at [facility], [address], [place], [place] [address]    Report Status PENDING  Incomplete         Radiology Studies: DG Chest Port 1 View Result Date: 04/27/2023 CLINICAL DATA:  Lactic acidosis EXAM: PORTABLE CHEST 1 VIEW COMPARISON:  04/09/2023 FINDINGS: Post sternotomy changes. No acute airspace disease or effusion. Stable cardiomediastinal silhouette with  aortic atherosclerosis. No pneumothorax IMPRESSION: No active disease. Electronically Signed   By: Jasmine Pang M.D.   On: 04/27/2023 18:22   CT ABDOMEN PELVIS WO CONTRAST Result Date: 04/27/2023 CLINICAL DATA:  Abdominal pain EXAM: CT ABDOMEN AND PELVIS WITHOUT CONTRAST TECHNIQUE: Multidetector CT imaging of the abdomen and pelvis was performed following the standard protocol without IV contrast. RADIATION DOSE REDUCTION: This exam was performed according to the departmental dose-optimization program which includes automated exposure control, adjustment of the mA and/or kV according to patient size and/or use of iterative reconstruction technique. COMPARISON:  CT 02/04/2023 FINDINGS: Lower chest: Lung bases are clear. Coronary vascular calcifications. Small hiatal hernia Hepatobiliary: No focal liver abnormality is seen. No gallstones, gallbladder wall thickening, or biliary dilatation. Pancreas: Coarse pancreatic calcifications suggestive of chronic pancreatitis. No acute inflammation Spleen: Normal in size without focal abnormality. Adrenals/Urinary Tract: History of right adrenalectomy. Left adrenal gland demonstrates mild diffuse thickening but no mass. No hydronephrosis. The bladder is unremarkable. Stomach/Bowel: Stomach is within normal limits. Appendix appears normal. No evidence of bowel wall thickening, distention, or inflammatory changes.  Vascular/Lymphatic: Aortic atherosclerosis. No enlarged abdominal or pelvic lymph nodes. Reproductive: Hysterectomy.  No adnexal mass Other: Negative for pelvic effusion or free air. Lobulated soft tissue density at the low midline anterior pelvis is chronic and may relate to chronic scarring. Musculoskeletal: No acute osseous abnormality IMPRESSION: 1. No CT evidence for acute intra-abdominal or pelvic abnormality. 2. Small hiatal hernia. 3. Findings suggestive of chronic pancreatitis. No acute inflammatory changes about the pancreas 4. Aortic atherosclerosis. Electronically Signed   By: Jasmine Pang M.D.   On: 04/27/2023 17:17   DG Wrist Complete Left Result Date: 04/27/2023 CLINICAL DATA:  Wrist pain EXAM: LEFT WRIST - COMPLETE 3+ VIEW COMPARISON:  12/20/2021 FINDINGS: Bony demineralization. 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 The orientation of the scaphoid contributes to bony superimposition and makes it difficult to fully assess. No definite fracture identified. IMPRESSION: 1. 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 2. Bony demineralization. Electronically Signed   By: Gaylyn Rong M.D.   On: 04/27/2023 16:59        Scheduled Meds:  Chlorhexidine Gluconate Cloth  6 each Topical Q0600   heparin injection (subcutaneous)  5,000 Units Subcutaneous Q8H   insulin aspart  0-9 Units Subcutaneous Q4H   insulin glargine-yfgn  10 Units Subcutaneous Daily   pantoprazole (PROTONIX) IV  40 mg Intravenous Q12H   Continuous Infusions:  lactated ringers 75 mL/hr at 04/29/23 0716     LOS: 2 days    Time spent: 48 minutes spent on 04/29/2023 caring for this patient face-to-face including chart review, ordering labs/tests, documenting, discussion with nursing staff, consultants, updating family and interview/physical exam    Alvira Philips [place], DO [facility] Available  via Epic secure chat 7am-7pm After these hours, please refer to coverage provider listed on [email or web address] 04/29/2023, 10:50 AM

## 2023-04-29 NOTE — Consult Note (Signed)
 ORTHOPAEDIC CONSULTATION  REQUESTING PHYSICIAN: [place], Eric J, DO  Chief Complaint: left wrist pain  HPI: Lori [name] is a 53 y.o. female that is not the best historian and is somewhat difficult to understand who complains of pain in the left wrist after a fall. Unable to determine when the fall happened.   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 The orientation of the scaphoid contributes to bony superimposition and makes it difficult to fully assess. No definite fracture identified. Bony demineralization.  Orthopedics was consulted for evaluation.    + history of CVA and DVT.  Previously non-ambulatory at baseline.  The patient is living at home with her husband.    Past Medical History:  Diagnosis Date   CAD (coronary artery disease)    Depression    Diabetes mellitus without complication (HCC)    History of CT scan    History of mammogram    History of MRI    Hypertension    Pheochromocytoma    Reversible cerebrovascular vasoconstriction syndrome    Stroke [facility])    Thyroid disease    Past Surgical History:  Procedure Laterality Date   ABDOMINAL HYSTERECTOMY     BIOPSY  02/06/2023   Procedure: BIOPSY;  Surgeon: Benancio Deeds, MD;  Location: [facility];  Service: Gastroenterology;;   CESAREAN SECTION     3   CORONARY ARTERY BYPASS GRAFT     ESOPHAGOGASTRODUODENOSCOPY (EGD) WITH PROPOFOL N/A 02/06/2023   Procedure: ESOPHAGOGASTRODUODENOSCOPY (EGD) WITH PROPOFOL;  Surgeon: Benancio Deeds, MD;  Location: [facility];  Service: Gastroenterology;  Laterality: N/A;   Right adrenal gland removal for pheochromocytoma Right    Social History   Socioeconomic History   Marital status: Married    Spouse name: Not on file   Number of children: Not on file   Years of education: Not on file   Highest education level: Not on file  Occupational History   Not on  file  Tobacco Use   Smoking status: Never   Smokeless tobacco: Never  Vaping Use   Vaping status: Never Used  Substance and Sexual Activity   Alcohol use: Not Currently   Drug use: Never   Sexual activity: Not on file  Other Topics Concern   Not on file  Social History Narrative   Tobacco use, amount per day now: Never   Past tobacco use, amount per day: Never   How many years did you use tobacco: Never   Alcohol use (drinks per week): Not Currently.   Diet: Eat out a lot.    Do you drink/eat things with caffeine: Sweet Tea   Marital status:   Married                               What year were you married? 2000   Do you live in a house, apartment, assisted living, condo, trailer, etc.? House   Is it one or more stories? 1   How many persons live in your home? 2   Do you have pets in your home?( please list) No   Highest Level of education completed? Bachelors Degree.   Current or past profession: Solicitor, [employer]   Do you exercise?   No  Type and how often?   Do you have a living will? No   Do you have a DNR form?       No                            If not, do you want to discuss one?   Do you have signed POA/HPOA forms?   No                     If so, please bring to you appointment      Do you have any difficulty bathing or dressing yourself? Yes   Do you have any difficulty preparing food or eating? Yes   Do you have any difficulty managing your medications? Yes   Do you have any difficulty managing your finances? No   Do you have any difficulty affording your medications? Yes   Social Drivers of Corporate investment banker Strain: Not on file  Food Insecurity: Patient Unable To Answer (04/28/2023)   Hunger Vital Sign    Worried About Running Out of Food in the Last Year: Patient unable to answer    Ran Out of Food in the Last Year: Patient unable to answer  Transportation Needs: Patient  Unable To Answer (04/28/2023)   PRAPARE - Administrator, Civil Service (Medical): Patient unable to answer    Lack of Transportation (Non-Medical): Patient unable to answer  Physical Activity: Not on file  Stress: Not on file  Social Connections: Not on file   Family History  Problem Relation Age of Onset   Hypertension Mother    Diabetes Mother    Atrial fibrillation Mother    Hypertension Father    Heart attack Father    Dementia Father    Diabetes Brother    Brain cancer Brother    Asthma Daughter    Sickle cell anemia Son    Atrial fibrillation Maternal Uncle    Allergies  Allergen Reactions   Asa [Aspirin] Anaphylaxis, Swelling and Other (See Comments)    Throat closes   Contrast Media [Iodinated Contrast Media] Anaphylaxis and Swelling   Imitrex [Sumatriptan] Anaphylaxis   Ms Contin [Morphine] Anaphylaxis and Swelling   Penicillins Swelling and Other (See Comments)    Mouth and eyes swell   Firvanq [Vancomycin] Other (See Comments)    Red Man's Syndrome   Cipro [Ciprofloxacin Hcl] Nausea And Vomiting   Nitrofuran Derivatives Anxiety   Wound Dressing Adhesive Itching and Rash   Prior to Admission medications   Medication Sig Start Date End Date Taking? Authorizing Provider  acetaminophen (TYLENOL) 325 MG tablet Take 2 tablets (650 mg total) by mouth every 6 (six) hours as needed for mild pain (pain score 1-3) (or Fever >/= 101). 02/12/23   Rodolph Bong, MD  albuterol (PROVENTIL) (2.5 MG/3ML) 0.083% nebulizer solution Take 3 mLs (2.5 mg total) by nebulization every 6 (six) hours as needed for wheezing or shortness of breath. 01/01/23   Ngetich, Dinah C, NP  albuterol (VENTOLIN HFA) 108 (90 Base) MCG/ACT inhaler INHALE 2 PUFFS BY MOUTH EVERY 6 HOURS AS NEEDED FOR WHEEZING AND FOR SHORTNESS OF BREATH Strength: 108 (90 Base) MCG/ACT 01/01/23   Ngetich, Dinah C, NP  atorvastatin (LIPITOR) 80 MG tablet TAKE 1 TABLET (80 MG TOTAL) BY MOUTH DAILY. 03/31/23    Ngetich, Dinah C, NP  baclofen (LIORESAL) 10 MG tablet Take 1 tablet (10 mg total)  by mouth 3 (three) times daily. 01/08/23   Ngetich, Dinah C, NP  clopidogrel (PLAVIX) 75 MG tablet TAKE 1 TABLET BY MOUTH DAILY 04/09/23   Ngetich, Donalee Citrin, NP  Continuous Glucose Receiver (FREESTYLE LIBRE 2 READER) DEVI 1 Device by Does not apply route daily. E11.69 11/01/22   Ngetich, Dinah C, NP  Continuous Glucose Sensor (FREESTYLE LIBRE 14 DAY SENSOR) MISC 1 each by Does not apply route as needed. DX: E11.69 12/24/22   Ngetich, Dinah C, NP  cyclobenzaprine (FLEXERIL) 5 MG tablet TAKE 1 TABLET BY MOUTH 3 TIMES DAILY AS NEEDED FOR MUSCLE SPASMS Patient taking differently: Take 5 mg by mouth at bedtime. 11/18/22   Lovorn, Aundra Millet, MD  diclofenac Sodium (VOLTAREN) 1 % GEL Apply 2 g topically 4 (four) times daily.    [provider]  escitalopram (LEXAPRO) 20 MG tablet Take 1 tablet (20 mg total) by mouth every morning. 01/01/23   Ngetich, Dinah C, NP  ezetimibe (ZETIA) 10 MG tablet TAKE 1 TABLET BY MOUTH DAILY 04/09/23   Ngetich, Dinah C, NP  fluticasone (FLONASE) 50 MCG/ACT nasal spray Place 2 sprays into both nostrils daily for 7 days. 02/12/23 04/25/23  Rodolph Bong, MD  gabapentin (NEURONTIN) 100 MG capsule Take 100 mg by mouth 3 (three) times daily. 11/14/22   [provider]  insulin glargine (LANTUS SOLOSTAR) 100 UNIT/ML Solostar Pen Inject 14 Units into the skin at bedtime. 02/13/23   Rodolph Bong, MD  insulin lispro (HUMALOG) 100 UNIT/ML injection Sliding scale AC, HS CBG 121 - 150: 2 units  CBG 151 - 200: 3 units  CBG 201 - 250: 5 units  CBG 251 - 300: 8 units  CBG 301 - 350: 11 units  CBG 351 - 400: 15 units  CBG > 400: call MD 12/06/22   Jerald Kief, MD  loratadine (CLARITIN) 10 MG tablet Take 1 tablet (10 mg total) by mouth daily. 02/12/23   Rodolph Bong, MD  LORazepam (ATIVAN) 0.5 MG tablet Take 1 tablet (0.5 mg total) by mouth at bedtime. 02/12/23   Rodolph Bong, MD   methimazole (TAPAZOLE) 5 MG tablet TAKE 1 TABLET BY MOUTH EVERY  MORNING 04/09/23   Ngetich, Dinah C, NP  Metoclopramide HCl (GIMOTI) 15 MG/ACT SOLN Place 1 spray into the nose 3 (three) times daily before meals. 30 minutes before meals 04/18/23   Armbruster, Willaim Rayas, MD  metoprolol tartrate (LOPRESSOR) 100 MG tablet Take 1 tablet (100 mg total) by mouth 2 (two) times daily. Hold for SBP < 110 02/12/23   Rodolph Bong, MD  oxymetazoline (AFRIN) 0.05 % nasal spray Place 1 spray into both nostrils 2 (two) times daily as needed for congestion (nasal bleeding). 02/12/23   Rodolph Bong, MD  pantoprazole (PROTONIX) 40 MG tablet Take 1 tablet (40 mg total) by mouth 2 (two) times daily. 04/11/23   Armbruster, Willaim Rayas, MD  Prucalopride Succinate (MOTEGRITY) 2 MG TABS Take 1 tablet (2 mg total) by mouth daily. LOT: [id number], EXP: 11-2023 04/11/23   Benancio Deeds, MD  psyllium (METAMUCIL) 58.6 % powder Take 1 packet by mouth daily. Patient not taking: Reported on 04/25/2023    [provider]  ranolazine (RANEXA) 500 MG 12 hr tablet Take 1 tablet (500 mg total) by mouth 2 (two) times daily. 10/09/22 01/23/24  [place], Alvira Philips, DO  rivaroxaban (XARELTO) 10 MG TABS tablet TAKE 1 TABLET BY MOUTH DAILY 04/09/23   Ngetich, Donalee Citrin, NP  senna-docusate (  SENOKOT-S) 8.6-50 MG tablet Take 1 tablet by mouth 2 (two) times daily. 02/12/23   Rodolph Bong, MD  sorbitol 70 % SOLN Take 30 mLs by mouth daily as needed for moderate constipation. 02/12/23   Rodolph Bong, MD  sucralfate (CARAFATE) 1 GM/10ML suspension Take 10 mLs (1 g total) by mouth at bedtime. 04/11/23   Armbruster, Willaim Rayas, MD  tamsulosin (FLOMAX) 0.4 MG CAPS capsule Take 0.4 mg by mouth at bedtime. 11/04/22   [provider]  topiramate (TOPAMAX) 25 MG tablet Take 1 tablet (25 mg total) by mouth 2 (two) times daily. 07/10/22   Sherryll Burger, Pratik D, DO  zinc oxide (BALMEX) 11.3 % CREA cream Apply 1 Application topically 2 (two) times daily.     [provider]   DG Chest Port 1 View Result Date: 04/27/2023 CLINICAL DATA:  Lactic acidosis EXAM: PORTABLE CHEST 1 VIEW COMPARISON:  04/09/2023 FINDINGS: Post sternotomy changes. No acute airspace disease or effusion. Stable cardiomediastinal silhouette with aortic atherosclerosis. No pneumothorax IMPRESSION: No active disease. Electronically Signed   By: Jasmine Pang M.D.   On: 04/27/2023 18:22   CT ABDOMEN PELVIS WO CONTRAST Result Date: 04/27/2023 CLINICAL DATA:  Abdominal pain EXAM: CT ABDOMEN AND PELVIS WITHOUT CONTRAST TECHNIQUE: Multidetector CT imaging of the abdomen and pelvis was performed following the standard protocol without IV contrast. RADIATION DOSE REDUCTION: This exam was performed according to the departmental dose-optimization program which includes automated exposure control, adjustment of the mA and/or kV according to patient size and/or use of iterative reconstruction technique. COMPARISON:  CT 02/04/2023 FINDINGS: Lower chest: Lung bases are clear. Coronary vascular calcifications. Small hiatal hernia Hepatobiliary: No focal liver abnormality is seen. No gallstones, gallbladder wall thickening, or biliary dilatation. Pancreas: Coarse pancreatic calcifications suggestive of chronic pancreatitis. No acute inflammation Spleen: Normal in size without focal abnormality. Adrenals/Urinary Tract: History of right adrenalectomy. Left adrenal gland demonstrates mild diffuse thickening but no mass. No hydronephrosis. The bladder is unremarkable. Stomach/Bowel: Stomach is within normal limits. Appendix appears normal. No evidence of bowel wall thickening, distention, or inflammatory changes. Vascular/Lymphatic: Aortic atherosclerosis. No enlarged abdominal or pelvic lymph nodes. Reproductive: Hysterectomy.  No adnexal mass Other: Negative for pelvic effusion or free air. Lobulated soft tissue density at the low midline anterior pelvis is chronic and may relate to chronic scarring.  Musculoskeletal: No acute osseous abnormality IMPRESSION: 1. No CT evidence for acute intra-abdominal or pelvic abnormality. 2. Small hiatal hernia. 3. Findings suggestive of chronic pancreatitis. No acute inflammatory changes about the pancreas 4. Aortic atherosclerosis. Electronically Signed   By: Jasmine Pang M.D.   On: 04/27/2023 17:17   DG Wrist Complete Left Result Date: 04/27/2023 CLINICAL DATA:  Wrist pain EXAM: LEFT WRIST - COMPLETE 3+ VIEW COMPARISON:  12/20/2021 FINDINGS: Bony demineralization. 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 The orientation of the scaphoid contributes to bony superimposition and makes it difficult to fully assess. No definite fracture identified. IMPRESSION: 1. 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 2. Bony demineralization. Electronically Signed   By: Gaylyn Rong M.D.   On: 04/27/2023 16:59    Positive ROS: All other systems have been reviewed and were otherwise negative with the exception of those mentioned in the HPI and as above.  Objective: Labs cbc Recent Labs    04/27/23 1542 04/29/23 0519  WBC 10.7* 12.6*  HGB 13.4 10.8*  HCT 41.0 33.0*  PLT 343 232  Labs inflam No results for input(s): "CRP" in the last 72 hours.  Invalid input(s): "ESR"  Labs coag No results for input(s): "INR", "PTT" in the last 72 hours.  Invalid input(s): "PT"  Recent Labs    04/28/23 0223 04/29/23 0519  NA 141 144  K 3.7 3.8  CL 107 110  CO2 20* 25  GLUCOSE 134* 103*  BUN 33* 25*  CREATININE 2.93* 1.46*  CALCIUM 8.8* 9.1    Physical Exam: Vitals:   04/29/23 0330 04/29/23 0800  BP: 99/74 102/72  Pulse: 91 90  Resp: 15 16  Temp: 97.7 F (36.5 C) 98 F (36.7 C)  SpO2: 99% 100%   General: Alert, no acute distress.  Laying in bed, calm Mental status: Alert and Oriented x3  Neurologic: no gross focal findings   Respiratory: No cyanosis, no use of accessory musculature Cardiovascular: No pedal edema GI: Abdomen is soft and non-tender, non-distended. Skin: Warm and dry.  No lesions in the area of chief complaint. Extremities: Warm and well perfused  Psychiatric: Patient is competent for consent with normal mood and affect  MUSCULOSKELETAL:  Left wrist mildly TTP if I press hard, non-TTP with light to medium pressure, pain reproduced with PROM of fingers, fingers kept in a flexed position at baseline, limited wrist ROM, no erythema or ecchymosis, body habitus makes it difficult to ascertain if edema present. She wanted me to remove the wrist brace because it was hurting her.   Other extremities are atraumatic with painless ROM and NVI.  Assessment / Plan: Principal Problem:   Acute renal failure (ARF) (HCC) Active Problems:   Essential hypertension   CAD (coronary artery disease)   Debility   Hypothyroidism   GERD (gastroesophageal reflux disease)   Chronic diastolic CHF (congestive heart failure) (HCC)   History of DVT (deep vein thrombosis)   Carotid stenosis, bilateral   Cerebrovascular disease   Anticoagulated   Not completely convinced this is truly a scapholunate disruption. Likely poor orientation on xray due to patient's limited ROM at baseline. Evidence of osteoarthritis which could be contributing to some wrist pain. She adamantly wanted me to remove the wrist brace because it was hurting her.  Apply ice to wrist PRN Elevate wrist on pillows to help with pain and swelling  Weightbearing: WBAT LUE Orthopedic device(s):  wrist brace PRN VTE prophylaxis:  Xarelto 10mg  daily and heparin   Pain control: per primary Follow - up plan: PRN; if issues continue then would recommend outpatient f/u with a hand specialist Contact information:  Margarita Rana MD, Levester Fresh PA-C  Will sign off.   Jenne Pane PA-C Office [phone] 04/29/2023 1:36 PM

## 2023-04-29 NOTE — TOC Progression Note (Signed)
 Transition of Care [facility]) - Progression Note    Patient Details  Name: Lori [name] MRN: [id number] Date of Birth: 15-Nov-1970  Transition of Care [facility]) CM/SW Contact  Eduard Roux, [place] Phone Number: 04/29/2023, 4:44 PM  Clinical Narrative:     Patient will d/c to [facility] once stable for discharge.  TOC will confirm bed availability once she is close to being medically stable.  TOC will continue to follow and assist with discharge planning.  Antony Blackbird, MSW, LCSW Clinical Social Worker     Expected Discharge Plan: Skilled Nursing Facility Barriers to Discharge: English as a second language teacher, Continued Medical Work up  Expected Discharge Plan and Services In-house Referral: Clinical Social Work     Living arrangements for the past 2 months: Single Family Home                                       Social Determinants of Health (SDOH) Interventions SDOH Screenings   Food Insecurity: Patient Unable To Answer (04/28/2023)  Housing: Patient Unable To Answer (04/28/2023)  Transportation Needs: Patient Unable To Answer (04/28/2023)  Utilities: Patient Unable To Answer (04/28/2023)  Depression (PHQ2-9): Low Risk  (04/03/2023)  Tobacco Use: Low Risk  (04/27/2023)    Readmission Risk Interventions    12/28/2022   11:30 AM 11/05/2022    1:38 PM 10/15/2022   10:25 AM  Readmission Risk Prevention Plan  Transportation Screening Complete Complete Complete  Medication Review Oceanographer) Complete Complete   PCP or Specialist appointment within 3-5 days of discharge Complete Complete Complete  HRI or Home Care Consult Complete Complete Complete  SW Recovery Care/Counseling Consult Complete Complete Complete  Palliative Care Screening Not Applicable Not Applicable Not Applicable  Skilled Nursing Facility Not Applicable Not Applicable Not Applicable

## 2023-04-29 NOTE — Evaluation (Addendum)
 Clinical/Bedside Swallow Evaluation Patient Details  Name: Lori [name] MRN: [id number] Date of Birth: April 11, 1970  Today's Date: 04/29/2023 Time: SLP Start Time (ACUTE ONLY): 1021 SLP Stop Time (ACUTE ONLY): 1040 SLP Time Calculation (min) (ACUTE ONLY): 19 min  Past Medical History:  Past Medical History:  Diagnosis Date   CAD (coronary artery disease)    Depression    Diabetes mellitus without complication (HCC)    History of CT scan    History of mammogram    History of MRI    Hypertension    Pheochromocytoma    Reversible cerebrovascular vasoconstriction syndrome    Stroke [facility])    Thyroid disease    Past Surgical History:  Past Surgical History:  Procedure Laterality Date   ABDOMINAL HYSTERECTOMY     BIOPSY  02/06/2023   Procedure: BIOPSY;  Surgeon: Benancio Deeds, MD;  Location: [facility];  Service: Gastroenterology;;   CESAREAN SECTION     3   CORONARY ARTERY BYPASS GRAFT     ESOPHAGOGASTRODUODENOSCOPY (EGD) WITH PROPOFOL N/A 02/06/2023   Procedure: ESOPHAGOGASTRODUODENOSCOPY (EGD) WITH PROPOFOL;  Surgeon: Benancio Deeds, MD;  Location: [facility];  Service: Gastroenterology;  Laterality: N/A;   Right adrenal gland removal for pheochromocytoma Right    HPI:  Lori [name] is a 53 yo female presenting to ED 3/23 with emesis and wrist pain after a recent fall. CXR and CT Abdomen/Pelvis negative for acute finding. Seen by SLP most recently 02/2023 with recommendations to continue baseline diet of Dys 3 solids with thin liquids secondary to orofacial weakness and edentulism s/p CVA 2023. PMH includes T2DM, gastroparesis, CAD s/p CABG, chronic diastolic congestive heart failure, history of CVA with residual L sided weakness/primarily bedbound status, DVT on Xarelto, pheochromocytoma s/p R adrenalectomy, hypothyroidism, OSA on CPAP    Assessment / Plan / Recommendation  Clinical Impression  Pt presents with seemingly improved mentation  this morning with MD initiating diet of clear liquids pending SLP evaluation. There was one instance of delayed coughing after trials of thin liquids, which pt attributed to the cold temperature of the water. Coughing did not recur when given sequential sips of room temperature water. She demonstrates slightly prolonged oral transit with intermittent oral groping when given trials of purees and solids, although ultimately achieves oral clearance and independently uses a liquid wash to clear mild oral residue. Recommend upgrading diet to include Dys 3 solids and continuing thin liquids. Pt is known to this SLP from prior admissions, during which pt has benefited from increased assistance from nursing staff with ordering desired meals and with set up for feeding at mealtimes. SLP will f/u at least briefly. SLP Visit Diagnosis: Dysphagia, unspecified (R13.10)    Aspiration Risk  Mild aspiration risk    Diet Recommendation Dysphagia 3 (Mech soft);Thin liquid    Liquid Administration via: Cup;Straw Medication Administration: Whole meds with puree Supervision: Patient able to self feed;Full supervision/cueing for compensatory strategies Compensations: Minimize environmental distractions;Slow rate;Small sips/bites Postural Changes: Seated upright at 90 degrees    Other  Recommendations Oral Care Recommendations: Oral care BID    Recommendations for follow up therapy are one component of a multi-disciplinary discharge planning process, led by the attending physician.  Recommendations may be updated based on patient status, additional functional criteria and insurance authorization.  Follow up Recommendations Skilled nursing-short term rehab (<3 hours/day)      Assistance Recommended at Discharge    Functional Status Assessment Patient has had a recent decline in their  functional status and demonstrates the ability to make significant improvements in function in a reasonable and predictable amount of  time.  Frequency and Duration min 2x/week  1 week       Prognosis Prognosis for improved oropharyngeal function: Good Barriers to Reach Goals: Time post onset      Swallow Study   General HPI: Lori [name] is a 53 yo female presenting to ED 3/23 with emesis and wrist pain after a recent fall. CXR and CT Abdomen/Pelvis negative for acute finding. Seen by SLP most recently 02/2023 with recommendations to continue baseline diet of Dys 3 solids with thin liquids secondary to orofacial weakness and edentulism s/p CVA 2023. PMH includes T2DM, gastroparesis, CAD s/p CABG, chronic diastolic congestive heart failure, history of CVA with residual L sided weakness/primarily bedbound status, DVT on Xarelto, pheochromocytoma s/p R adrenalectomy, hypothyroidism, OSA on CPAP Type of Study: Bedside Swallow Evaluation Previous Swallow Assessment: see HPI Diet Prior to this Study: Clear liquid diet Temperature Spikes Noted: No Respiratory Status: Room air History of Recent Intubation: No Behavior/Cognition: Alert;Cooperative;Pleasant mood Oral Cavity Assessment: Within Functional Limits Oral Care Completed by SLP: No Oral Cavity - Dentition: Edentulous Vision: Functional for self-feeding Self-Feeding Abilities: Needs assist Patient Positioning: Upright in bed Baseline Vocal Quality: Normal Volitional Cough: Strong Volitional Swallow: Able to elicit    Oral/Motor/Sensory Function Overall Oral Motor/Sensory Function: Generalized oral weakness   Ice Chips Ice chips: Not tested   Thin Liquid Thin Liquid: Impaired Presentation: Straw;Self Fed Pharyngeal  Phase Impairments: Cough - Delayed    Nectar Thick Nectar Thick Liquid: Not tested   Honey Thick Honey Thick Liquid: Not tested   Puree Puree: Within functional limits Presentation: Spoon   Solid     Solid: Impaired Presentation: Self Fed Oral Phase Functional Implications: Prolonged oral transit;Impaired mastication      Gwynneth Aliment, M.A., CF-SLP Speech Language Pathology, Acute Rehabilitation Services  Secure Chat preferred [phone]  04/29/2023,11:28 AM

## 2023-04-30 DIAGNOSIS — N170 Acute kidney failure with tubular necrosis: Secondary | ICD-10-CM | POA: Diagnosis not present

## 2023-04-30 LAB — BASIC METABOLIC PANEL
Anion gap: 8 (ref 5–15)
BUN: 11 mg/dL (ref 6–20)
CO2: 25 mmol/L (ref 22–32)
Calcium: 8.5 mg/dL — ABNORMAL LOW (ref 8.9–10.3)
Chloride: 105 mmol/L (ref 98–111)
Creatinine, Ser: 0.84 mg/dL (ref 0.44–1.00)
GFR, Estimated: >60 mL/min (ref >60)
Glucose, Bld: 100 mg/dL — ABNORMAL HIGH (ref 70–99)
Potassium: 3.4 mmol/L — ABNORMAL LOW (ref 3.5–5.1)
Sodium: 138 mmol/L (ref 135–145)

## 2023-04-30 LAB — CBC
HCT: 28.4 % — ABNORMAL LOW (ref 36.0–46.0)
Hemoglobin: 9.5 g/dL — ABNORMAL LOW (ref 12.0–15.0)
MCH: 26.4 pg (ref 26.0–34.0)
MCHC: 33.5 g/dL (ref 30.0–36.0)
MCV: 78.9 fL — ABNORMAL LOW (ref 80.0–100.0)
Platelets: 217 10*3/uL (ref 150–400)
RBC: 3.6 MIL/uL — ABNORMAL LOW (ref 3.87–5.11)
RDW: 17.2 % — ABNORMAL HIGH (ref 11.5–15.5)
WBC: 6.7 10*3/uL (ref 4.0–10.5)
nRBC: 0 % (ref 0.0–0.2)

## 2023-04-30 LAB — GLUCOSE, CAPILLARY
Glucose-Capillary: 101 mg/dL — ABNORMAL HIGH (ref 70–99)
Glucose-Capillary: 125 mg/dL — ABNORMAL HIGH (ref 70–99)
Glucose-Capillary: 130 mg/dL — ABNORMAL HIGH (ref 70–99)
Glucose-Capillary: 167 mg/dL — ABNORMAL HIGH (ref 70–99)

## 2023-04-30 MED ORDER — BACLOFEN 10 MG PO TABS
10.0000 mg | ORAL_TABLET | Freq: Three times a day (TID) | ORAL | Status: DC | PRN
  Administered 2023-04-30: 10 mg via ORAL
  Filled 2023-04-30: qty 1

## 2023-04-30 MED ORDER — ENSURE ENLIVE PO LIQD
237.0000 mL | Freq: Two times a day (BID) | ORAL | Status: DC
  Administered 2023-04-30 – 2023-05-01 (×3): 237 mL via ORAL

## 2023-04-30 MED ORDER — LACTATED RINGERS IV SOLN: INTRAVENOUS | Status: DC

## 2023-04-30 MED ORDER — LORAZEPAM 0.5 MG PO TABS: 0.5000 mg | ORAL_TABLET | Freq: Every day | ORAL | Status: DC

## 2023-04-30 MED ORDER — SALINE SPRAY 0.65 % NA SOLN
1.0000 | NASAL | Status: DC | PRN
  Filled 2023-04-30: qty 44

## 2023-04-30 MED ORDER — METOPROLOL TARTRATE 50 MG PO TABS
100.0000 mg | ORAL_TABLET | Freq: Two times a day (BID) | ORAL | Status: DC
  Administered 2023-04-30 – 2023-05-01 (×3): 100 mg via ORAL
  Filled 2023-04-30 (×3): qty 2

## 2023-04-30 MED ORDER — POTASSIUM CHLORIDE CRYS ER 20 MEQ PO TBCR
20.0000 meq | EXTENDED_RELEASE_TABLET | Freq: Two times a day (BID) | ORAL | Status: DC
  Administered 2023-04-30 – 2023-05-01 (×3): 20 meq via ORAL
  Filled 2023-04-30 (×3): qty 1

## 2023-04-30 MED ORDER — LORAZEPAM 0.5 MG PO TABS
0.5000 mg | ORAL_TABLET | Freq: Every evening | ORAL | Status: DC | PRN
  Administered 2023-04-30: 0.5 mg via ORAL
  Filled 2023-04-30: qty 1

## 2023-04-30 MED ORDER — RANOLAZINE ER 500 MG PO TB12
500.0000 mg | ORAL_TABLET | Freq: Two times a day (BID) | ORAL | Status: DC
  Administered 2023-04-30 – 2023-05-01 (×3): 500 mg via ORAL
  Filled 2023-04-30 (×5): qty 1

## 2023-04-30 NOTE — Plan of Care (Signed)
 Problem: Education: Goal: Ability to describe self-care measures that may prevent or decrease complications (Diabetes Survival Skills Education) will improve 04/30/2023 2311 by Carlene Coria, RN Outcome: Progressing 04/30/2023 2306 by Carlene Coria, RN Outcome: Progressing Goal: Individualized Educational Video(s) 04/30/2023 2311 by Carlene Coria, RN Outcome: Progressing 04/30/2023 2306 by Carlene Coria, RN Outcome: Progressing   Problem: Coping: Goal: Ability to adjust to condition or change in health will improve 04/30/2023 2311 by Carlene Coria, RN Outcome: Progressing 04/30/2023 2306 by Carlene Coria, RN Outcome: Progressing   Problem: Fluid Volume: Goal: Ability to maintain a balanced intake and output will improve 04/30/2023 2311 by Carlene Coria, RN Outcome: Progressing 04/30/2023 2306 by Carlene Coria, RN Outcome: Progressing   Problem: Health Behavior/Discharge Planning: Goal: Ability to identify and utilize available resources and services will improve 04/30/2023 2311 by Carlene Coria, RN Outcome: Progressing 04/30/2023 2306 by Carlene Coria, RN Outcome: Progressing Goal: Ability to manage health-related needs will improve 04/30/2023 2311 by Carlene Coria, RN Outcome: Progressing 04/30/2023 2306 by Carlene Coria, RN Outcome: Progressing   Problem: Metabolic: Goal: Ability to maintain appropriate glucose levels will improve 04/30/2023 2311 by Carlene Coria, RN Outcome: Progressing 04/30/2023 2306 by Carlene Coria, RN Outcome: Progressing   Problem: Nutritional: Goal: Maintenance of adequate nutrition will improve 04/30/2023 2311 by Carlene Coria, RN Outcome: Progressing 04/30/2023 2306 by Carlene Coria, RN Outcome: Progressing Goal: Progress toward achieving an optimal weight will improve 04/30/2023 2311 by Carlene Coria, RN Outcome: Progressing 04/30/2023 2306 by Carlene Coria,  RN Outcome: Progressing   Problem: Skin Integrity: Goal: Risk for impaired skin integrity will decrease 04/30/2023 2311 by Carlene Coria, RN Outcome: Progressing 04/30/2023 2306 by Carlene Coria, RN Outcome: Progressing   Problem: Tissue Perfusion: Goal: Adequacy of tissue perfusion will improve 04/30/2023 2311 by Carlene Coria, RN Outcome: Progressing 04/30/2023 2306 by Carlene Coria, RN Outcome: Progressing   Problem: Education: Goal: Ability to describe self-care measures that may prevent or decrease complications (Diabetes Survival Skills Education) will improve 04/30/2023 2311 by Carlene Coria, RN Outcome: Progressing 04/30/2023 2306 by Carlene Coria, RN Outcome: Progressing Goal: Individualized Educational Video(s) 04/30/2023 2311 by Carlene Coria, RN Outcome: Progressing 04/30/2023 2306 by Carlene Coria, RN Outcome: Progressing   Problem: Cardiac: Goal: Ability to maintain an adequate cardiac output will improve 04/30/2023 2311 by Carlene Coria, RN Outcome: Progressing 04/30/2023 2306 by Carlene Coria, RN Outcome: Progressing   Problem: Health Behavior/Discharge Planning: Goal: Ability to identify and utilize available resources and services will improve 04/30/2023 2311 by Carlene Coria, RN Outcome: Progressing 04/30/2023 2306 by Carlene Coria, RN Outcome: Progressing Goal: Ability to manage health-related needs will improve 04/30/2023 2311 by Carlene Coria, RN Outcome: Progressing 04/30/2023 2306 by Carlene Coria, RN Outcome: Progressing   Problem: Fluid Volume: Goal: Ability to achieve a balanced intake and output will improve 04/30/2023 2311 by Carlene Coria, RN Outcome: Progressing 04/30/2023 2306 by Carlene Coria, RN Outcome: Progressing   Problem: Metabolic: Goal: Ability to maintain appropriate glucose levels will improve 04/30/2023 2311 by Carlene Coria, RN Outcome:  Progressing 04/30/2023 2306 by Carlene Coria, RN Outcome: Progressing   Problem: Nutritional: Goal: Maintenance of adequate nutrition will improve 04/30/2023 2311 by Carlene Coria, RN Outcome: Progressing 04/30/2023 2306 by Carlene Coria, RN Outcome: Progressing Goal: Maintenance of adequate weight for body size and type  will improve 04/30/2023 2311 by Carlene Coria, RN Outcome: Progressing 04/30/2023 2306 by Carlene Coria, RN Outcome: Progressing   Problem: Respiratory: Goal: Will regain and/or maintain adequate ventilation 04/30/2023 2311 by Carlene Coria, RN Outcome: Progressing 04/30/2023 2306 by Carlene Coria, RN Outcome: Progressing   Problem: Urinary Elimination: Goal: Ability to achieve and maintain adequate renal perfusion and functioning will improve 04/30/2023 2311 by Carlene Coria, RN Outcome: Progressing 04/30/2023 2306 by Carlene Coria, RN Outcome: Progressing   Problem: Education: Goal: Knowledge of General Education information will improve Description: Including pain rating scale, medication(s)/side effects and non-pharmacologic comfort measures 04/30/2023 2311 by Carlene Coria, RN Outcome: Progressing 04/30/2023 2306 by Carlene Coria, RN Outcome: Progressing   Problem: Health Behavior/Discharge Planning: Goal: Ability to manage health-related needs will improve 04/30/2023 2311 by Carlene Coria, RN Outcome: Progressing 04/30/2023 2306 by Carlene Coria, RN Outcome: Progressing   Problem: Clinical Measurements: Goal: Ability to maintain clinical measurements within normal limits will improve 04/30/2023 2311 by Carlene Coria, RN Outcome: Progressing 04/30/2023 2306 by Carlene Coria, RN Outcome: Progressing Goal: Will remain free from infection 04/30/2023 2311 by Carlene Coria, RN Outcome: Progressing 04/30/2023 2306 by Carlene Coria, RN Outcome: Progressing Goal: Diagnostic test  results will improve 04/30/2023 2311 by Carlene Coria, RN Outcome: Progressing 04/30/2023 2306 by Carlene Coria, RN Outcome: Progressing Goal: Respiratory complications will improve 04/30/2023 2311 by Carlene Coria, RN Outcome: Progressing 04/30/2023 2306 by Carlene Coria, RN Outcome: Progressing Goal: Cardiovascular complication will be avoided 04/30/2023 2311 by Carlene Coria, RN Outcome: Progressing 04/30/2023 2306 by Carlene Coria, RN Outcome: Progressing   Problem: Activity: Goal: Risk for activity intolerance will decrease 04/30/2023 2311 by Carlene Coria, RN Outcome: Progressing 04/30/2023 2306 by Carlene Coria, RN Outcome: Progressing   Problem: Nutrition: Goal: Adequate nutrition will be maintained 04/30/2023 2311 by Carlene Coria, RN Outcome: Progressing 04/30/2023 2306 by Carlene Coria, RN Outcome: Progressing   Problem: Coping: Goal: Level of anxiety will decrease 04/30/2023 2311 by Carlene Coria, RN Outcome: Progressing 04/30/2023 2306 by Carlene Coria, RN Outcome: Progressing   Problem: Elimination: Goal: Will not experience complications related to bowel motility 04/30/2023 2311 by Carlene Coria, RN Outcome: Progressing 04/30/2023 2306 by Carlene Coria, RN Outcome: Progressing Goal: Will not experience complications related to urinary retention 04/30/2023 2311 by Carlene Coria, RN Outcome: Progressing 04/30/2023 2306 by Carlene Coria, RN Outcome: Progressing   Problem: Pain Managment: Goal: General experience of comfort will improve and/or be controlled 04/30/2023 2311 by Carlene Coria, RN Outcome: Progressing 04/30/2023 2306 by Carlene Coria, RN Outcome: Progressing   Problem: Safety: Goal: Ability to remain free from injury will improve 04/30/2023 2311 by Carlene Coria, RN Outcome: Progressing 04/30/2023 2306 by Carlene Coria, RN Outcome: Progressing    Problem: Skin Integrity: Goal: Risk for impaired skin integrity will decrease 04/30/2023 2311 by Carlene Coria, RN Outcome: Progressing 04/30/2023 2306 by Carlene Coria, RN Outcome: Progressing

## 2023-04-30 NOTE — Plan of Care (Signed)
 Problem: Education: Goal: Ability to describe self-care measures that may prevent or decrease complications (Diabetes Survival Skills Education) will improve 04/30/2023 0204 by Carlene Coria, RN Outcome: Progressing 04/29/2023 2017 by Carlene Coria, RN Outcome: Progressing Goal: Individualized Educational Video(s) 04/30/2023 0204 by Carlene Coria, RN Outcome: Progressing 04/29/2023 2017 by Carlene Coria, RN Outcome: Progressing   Problem: Coping: Goal: Ability to adjust to condition or change in health will improve 04/30/2023 0204 by Carlene Coria, RN Outcome: Progressing 04/29/2023 2017 by Carlene Coria, RN Outcome: Progressing   Problem: Fluid Volume: Goal: Ability to maintain a balanced intake and output will improve 04/30/2023 0204 by Carlene Coria, RN Outcome: Progressing 04/29/2023 2017 by Carlene Coria, RN Outcome: Progressing   Problem: Health Behavior/Discharge Planning: Goal: Ability to identify and utilize available resources and services will improve 04/30/2023 0204 by Carlene Coria, RN Outcome: Progressing 04/29/2023 2017 by Carlene Coria, RN Outcome: Progressing Goal: Ability to manage health-related needs will improve 04/30/2023 0204 by Carlene Coria, RN Outcome: Progressing 04/29/2023 2017 by Carlene Coria, RN Outcome: Progressing   Problem: Metabolic: Goal: Ability to maintain appropriate glucose levels will improve 04/30/2023 0204 by Carlene Coria, RN Outcome: Progressing 04/29/2023 2017 by Carlene Coria, RN Outcome: Progressing   Problem: Nutritional: Goal: Maintenance of adequate nutrition will improve 04/30/2023 0204 by Carlene Coria, RN Outcome: Progressing 04/29/2023 2017 by Carlene Coria, RN Outcome: Progressing Goal: Progress toward achieving an optimal weight will improve 04/30/2023 0204 by Carlene Coria, RN Outcome: Progressing 04/29/2023 2017 by Carlene Coria,  RN Outcome: Progressing   Problem: Skin Integrity: Goal: Risk for impaired skin integrity will decrease 04/30/2023 0204 by Carlene Coria, RN Outcome: Progressing 04/29/2023 2017 by Carlene Coria, RN Outcome: Progressing   Problem: Tissue Perfusion: Goal: Adequacy of tissue perfusion will improve 04/30/2023 0204 by Carlene Coria, RN Outcome: Progressing 04/29/2023 2017 by Carlene Coria, RN Outcome: Progressing   Problem: Education: Goal: Ability to describe self-care measures that may prevent or decrease complications (Diabetes Survival Skills Education) will improve 04/30/2023 0204 by Carlene Coria, RN Outcome: Progressing 04/29/2023 2017 by Carlene Coria, RN Outcome: Progressing Goal: Individualized Educational Video(s) 04/30/2023 0204 by Carlene Coria, RN Outcome: Progressing 04/29/2023 2017 by Carlene Coria, RN Outcome: Progressing   Problem: Cardiac: Goal: Ability to maintain an adequate cardiac output will improve 04/30/2023 0204 by Carlene Coria, RN Outcome: Progressing 04/29/2023 2017 by Carlene Coria, RN Outcome: Progressing   Problem: Health Behavior/Discharge Planning: Goal: Ability to identify and utilize available resources and services will improve 04/30/2023 0204 by Carlene Coria, RN Outcome: Progressing 04/29/2023 2017 by Carlene Coria, RN Outcome: Progressing Goal: Ability to manage health-related needs will improve 04/30/2023 0204 by Carlene Coria, RN Outcome: Progressing 04/29/2023 2017 by Carlene Coria, RN Outcome: Progressing   Problem: Fluid Volume: Goal: Ability to achieve a balanced intake and output will improve 04/30/2023 0204 by Carlene Coria, RN Outcome: Progressing 04/29/2023 2017 by Carlene Coria, RN Outcome: Progressing   Problem: Metabolic: Goal: Ability to maintain appropriate glucose levels will improve 04/30/2023 0204 by Carlene Coria, RN Outcome:  Progressing 04/29/2023 2017 by Carlene Coria, RN Outcome: Progressing   Problem: Nutritional: Goal: Maintenance of adequate nutrition will improve 04/30/2023 0204 by Carlene Coria, RN Outcome: Progressing 04/29/2023 2017 by Carlene Coria, RN Outcome: Progressing Goal: Maintenance of adequate weight for body size and type  will improve 04/30/2023 0204 by Carlene Coria, RN Outcome: Progressing 04/29/2023 2017 by Carlene Coria, RN Outcome: Progressing   Problem: Respiratory: Goal: Will regain and/or maintain adequate ventilation 04/30/2023 0204 by Carlene Coria, RN Outcome: Progressing 04/29/2023 2017 by Carlene Coria, RN Outcome: Progressing   Problem: Urinary Elimination: Goal: Ability to achieve and maintain adequate renal perfusion and functioning will improve 04/30/2023 0204 by Carlene Coria, RN Outcome: Progressing 04/29/2023 2017 by Carlene Coria, RN Outcome: Progressing   Problem: Education: Goal: Knowledge of General Education information will improve Description: Including pain rating scale, medication(s)/side effects and non-pharmacologic comfort measures 04/30/2023 0204 by Carlene Coria, RN Outcome: Progressing 04/29/2023 2017 by Carlene Coria, RN Outcome: Progressing   Problem: Health Behavior/Discharge Planning: Goal: Ability to manage health-related needs will improve 04/30/2023 0204 by Carlene Coria, RN Outcome: Progressing 04/29/2023 2017 by Carlene Coria, RN Outcome: Progressing   Problem: Clinical Measurements: Goal: Ability to maintain clinical measurements within normal limits will improve 04/30/2023 0204 by Carlene Coria, RN Outcome: Progressing 04/29/2023 2017 by Carlene Coria, RN Outcome: Progressing Goal: Will remain free from infection 04/30/2023 0204 by Carlene Coria, RN Outcome: Progressing 04/29/2023 2017 by Carlene Coria, RN Outcome: Progressing Goal: Diagnostic test  results will improve 04/30/2023 0204 by Carlene Coria, RN Outcome: Progressing 04/29/2023 2017 by Carlene Coria, RN Outcome: Progressing Goal: Respiratory complications will improve 04/30/2023 0204 by Carlene Coria, RN Outcome: Progressing 04/29/2023 2017 by Carlene Coria, RN Outcome: Progressing Goal: Cardiovascular complication will be avoided 04/30/2023 0204 by Carlene Coria, RN Outcome: Progressing 04/29/2023 2017 by Carlene Coria, RN Outcome: Progressing   Problem: Activity: Goal: Risk for activity intolerance will decrease 04/30/2023 0204 by Carlene Coria, RN Outcome: Progressing 04/29/2023 2017 by Carlene Coria, RN Outcome: Progressing   Problem: Nutrition: Goal: Adequate nutrition will be maintained 04/30/2023 0204 by Carlene Coria, RN Outcome: Progressing 04/29/2023 2017 by Carlene Coria, RN Outcome: Progressing   Problem: Coping: Goal: Level of anxiety will decrease 04/30/2023 0204 by Carlene Coria, RN Outcome: Progressing 04/29/2023 2017 by Carlene Coria, RN Outcome: Progressing   Problem: Elimination: Goal: Will not experience complications related to bowel motility 04/30/2023 0204 by Carlene Coria, RN Outcome: Progressing 04/29/2023 2017 by Carlene Coria, RN Outcome: Progressing Goal: Will not experience complications related to urinary retention 04/30/2023 0204 by Carlene Coria, RN Outcome: Progressing 04/29/2023 2017 by Carlene Coria, RN Outcome: Progressing   Problem: Pain Managment: Goal: General experience of comfort will improve and/or be controlled 04/30/2023 0204 by Carlene Coria, RN Outcome: Progressing 04/29/2023 2017 by Carlene Coria, RN Outcome: Progressing   Problem: Safety: Goal: Ability to remain free from injury will improve 04/30/2023 0204 by Carlene Coria, RN Outcome: Progressing 04/29/2023 2017 by Carlene Coria, RN Outcome: Progressing    Problem: Skin Integrity: Goal: Risk for impaired skin integrity will decrease 04/30/2023 0204 by Carlene Coria, RN Outcome: Progressing 04/29/2023 2017 by Carlene Coria, RN Outcome: Progressing

## 2023-04-30 NOTE — TOC Progression Note (Signed)
 Transition of Care [facility]) - Progression Note    Patient Details  Name: Lori [name] MRN: [id number] Date of Birth: May 06, 1970  [facility] CM/SW Contact  Eduard Roux, [place] Phone Number: 04/30/2023, 1:09 PM  Clinical Narrative:     CSW met with patient at bedside. CSW introduced self and explained role. CSW discussed with patient recommendation for short term rehab at [facility]. CSW explained her husband was agreeable for her to d/c to SNF. Patient states she wants to go home. CSW inquired if she has any home concerns,she states no. Patient states she will need PTAR once stable.   Antony Blackbird, MSW, LCSW Clinical Social Worker    Expected Discharge Plan: Home w Home Health Services Barriers to Discharge: Continued Medical Work up  Expected Discharge Plan and Services In-house Referral: Clinical Social Work     Living arrangements for the past 2 months: Single Family Home                                       Social Determinants of Health (SDOH) Interventions SDOH Screenings   Food Insecurity: Patient Unable To Answer (04/28/2023)  Housing: Patient Unable To Answer (04/28/2023)  Transportation Needs: Patient Unable To Answer (04/28/2023)  Utilities: Patient Unable To Answer (04/28/2023)  Depression (PHQ2-9): Low Risk  (04/03/2023)  Tobacco Use: Low Risk  (04/27/2023)    Readmission Risk Interventions    12/28/2022   11:30 AM 11/05/2022    1:38 PM 10/15/2022   10:25 AM  Readmission Risk Prevention Plan  Transportation Screening Complete Complete Complete  Medication Review Oceanographer) Complete Complete   PCP or Specialist appointment within 3-5 days of discharge Complete Complete Complete  HRI or Home Care Consult Complete Complete Complete  SW Recovery Care/Counseling Consult Complete Complete Complete  Palliative Care Screening Not Applicable Not Applicable Not Applicable  Skilled Nursing Facility Not Applicable Not  Applicable Not Applicable

## 2023-04-30 NOTE — Plan of Care (Signed)
  Problem: SLP Dysphagia Goals Goal: Patient will utilize recommended strategies Description: Patient will utilize recommended strategies during swallow to increase swallowing safety with Outcome: Completed/Met

## 2023-04-30 NOTE — Progress Notes (Signed)
 Part of work up

## 2023-04-30 NOTE — Progress Notes (Signed)
 PROGRESS NOTE    Lori [name]  ZOX:[id number] DOB: December 04, 1970 DOA: 04/27/2023 PCP: Caesar Bookman, NP    Brief Narrative:  Patient is 53 year old with history of type 2 diabetes and gastroparesis, CAD status post CABG, chronic diastolic heart failure, history of stroke with residual left-sided weakness, left upper extremity contracture, DVT on Xarelto, pheochromocytoma status post right adrenalectomy, hypothyroidism, sleep apnea on CPAP brought from home to the emergency room with nausea, vomiting, left wrist pain after recent fall.  Patient was found confused and poor historian.  Family reported poor appetite, severe nausea and vomiting.  In the emergency room afebrile.  Heart rate 120.  On room air.  Glucose 331, creatinine 3.94.  Lactic acid 4.3.  Beta hydroxybutyrate 1.96.  Urinalysis without any abnormalities.  UDS negative.  Chest x-ray normal.  Left wrist x-ray with suspicious volar intercalated segmental instability.  CT scan abdomen pelvis with no acute findings.  Patient was admitted to the hospital with clinical dehydration, AKI.  Subjective: Patient seen and examined.  She tells me she has some abdominal discomfort but no other problems.  She is eating regular diet as per her.  Patient is asking me to tell her husband not to send her to nursing home but to take her home.  Assessment & Plan:  Diabetic ketoacidosis in a patient with type 2 diabetes, gastroparesis.  Severe dehydration and lactic acidosis. Presented with nausea vomiting for 2 days.  Blood glucose 343, anion gap 19 and beta-hydroxybutyrate 1.96, lactic acid 4.3.  Infection workup was negative.  Treated with IV fluids and insulin infusion with improvement.  Now back on subcu insulin.  Blood sugars are acceptable. Able to advance diet today.  Will continue to monitor blood sugars.  Discontinue IV fluids. Monitor electrolytes and renal functions. Presented with creatinine of 3.94 likely secondary to  prerenal azotemia and dehydration in the setting of DKA.  Treated with fluid.  Renal functions normalized.  Remove Foley catheter today.  Encourage oral intake.  Left wrist injury: Suspected volar intercalated segmental instability: Seen by hand surgery.  Wrist splint.  Weightbearing as tolerated.  Pain medications.  Acute metabolic encephalopathy due to above.  Clinically improving.  History of stroke with residual left hemiaplasia, left upper extremity contractures: Neurologically stable.  Mostly bedbound and wheelchair dependent at home. PT OT consulted.  Recommended SNF.  Patient wants to go home.  Hypothyroidism: TSH 0.47.  Back on methimazole.  Hyperlipidemia: On atorvastatin and Zetia.  History of DVT on anticoagulation: On maintenance Xarelto.  Continue.  Major depressive disorder and anxiety: On Lexapro.  Go back on home dose of Ativan.  Sleep apnea: On CPAP at night.  Coronary artery disease status post CABG: On Plavix.  Resume metoprolol and Ranexa.   DVT prophylaxis: rivaroxaban (XARELTO) tablet 10 mg Start: 04/29/23 1145 rivaroxaban (XARELTO) tablet 10 mg   Code Status: Full code Family Communication: Husband called and updated on the phone. Disposition Plan: Status is: Inpatient Remains inpatient appropriate because: On IV fluids, awaiting clinical improvement.     Consultants:  Hand surgery  Procedures:  None  Antimicrobials:  None     Objective: Vitals:   04/29/23 2315 04/30/23 0339 04/30/23 0805 04/30/23 1112  BP: 112/62 120/79 129/82 123/75  Pulse: (!) 110  (!) 106 (!) 123  Resp: 15  13 19   Temp: 98.9 F (37.2 C) 99 F (37.2 C) 98.6 F (37 C) 99.2 F (37.3 C)  TempSrc: Oral Oral Oral Oral  SpO2: 92%  94% 94%  Weight:      Height:        Intake/Output Summary (Last 24 hours) at 04/30/2023 1200 Last data filed at 04/30/2023 0342 Gross per 24 hour  Intake 1517.42 ml  Output 2400 ml  Net -882.58 ml   Filed Weights   04/27/23 1528   Weight: 84 kg    Examination:  General exam: chronically sick looking , debilitated.  Respiratory system: Clear to auscultation. Respiratory effort normal. Cardiovascular system: S1 & S2 heard, RRR.   Gastrointestinal system: soft , NT , BS present.  Foley catheter with clear urine. Central nervous system: Alert and oriented.  Contracture of left upper extremity, weakness on the left lower extremity. Psychiatry: Judgement and insight appear normal. Mood & affect appropriate.     Data Reviewed: I have personally reviewed following labs and imaging studies  CBC: Recent Labs  Lab 04/25/23 1430 04/27/23 1542 04/29/23 0519 04/30/23 0520  WBC 8.3 10.7* 12.6* 6.7  NEUTROABS 5,437  --   --   --   HGB 14.4 13.4 10.8* 9.5*  HCT 46.3* 41.0 33.0* 28.4*  MCV 84.3 80.1 80.9 78.9*  PLT 281 343 232 217   Basic Metabolic Panel: Recent Labs  Lab 04/27/23 2200 04/28/23 0102 04/28/23 0223 04/29/23 0519 04/30/23 0520  NA 139 140 141 144 138  K 3.7 4.0 3.7 3.8 3.4*  CL 106 109 107 110 105  CO2 16* 17* 20* 25 25  GLUCOSE 226* 127* 134* 103* 100*  BUN 40* 36* 33* 25* 11  CREATININE 3.52* 3.03* 2.93* 1.46* 0.84  CALCIUM 9.3 9.2 8.8* 9.1 8.5*  MG  --   --   --  1.7  --   PHOS  --   --   --  3.5  --    GFR: Estimated Creatinine Clearance: 80.6 mL/min (by C-G formula based on SCr of 0.84 mg/dL). Liver Function Tests: Recent Labs  Lab 04/25/23 1430 04/27/23 1542 04/29/23 0519  AST 13 22 14*  ALT 18 19 13   ALKPHOS  --  83 58  BILITOT 0.4 0.7 0.6  PROT 8.0 8.3* 6.3*  ALBUMIN  --  4.1 3.0*   Recent Labs  Lab 04/27/23 1542  LIPASE 27   No results for input(s): "AMMONIA" in the last 168 hours. Coagulation Profile: No results for input(s): "INR", "PROTIME" in the last 168 hours. Cardiac Enzymes: No results for input(s): "CKTOTAL", "CKMB", "CKMBINDEX", "TROPONINI" in the last 168 hours. BNP (last 3 results) No results for input(s): "PROBNP" in the last 8760  hours. HbA1C: No results for input(s): "HGBA1C" in the last 72 hours. CBG: Recent Labs  Lab 04/29/23 1150 04/29/23 1624 04/29/23 2112 04/30/23 0822 04/30/23 1111  GLUCAP 107* 132* 143* 101* 125*   Lipid Profile: No results for input(s): "CHOL", "HDL", "LDLCALC", "TRIG", "CHOLHDL", "LDLDIRECT" in the last 72 hours. Thyroid Function Tests: Recent Labs    04/29/23 0519  TSH 0.475  FREET4 1.06   Anemia Panel: No results for input(s): "VITAMINB12", "FOLATE", "FERRITIN", "TIBC", "IRON", "RETICCTPCT" in the last 72 hours. Sepsis Labs: Recent Labs  Lab 04/27/23 1604 04/27/23 2200 04/28/23 0102 04/29/23 0519  LATICACIDVEN 4.3* 5.0* 5.6* 1.5    Recent Results (from the past 240 hours)  Culture, blood (Routine X 2) w Reflex to ID Panel     Status: None (Preliminary result)   Collection Time: 04/27/23  8:04 PM   Specimen: BLOOD  Result Value Ref Range Status   Specimen Description BLOOD SITE NOT SPECIFIED  Final   Special Requests   Final    BOTTLES DRAWN AEROBIC AND ANAEROBIC Blood Culture results may not be optimal due to an inadequate volume of blood received in culture bottles   Culture   Final    NO GROWTH 3 DAYS Performed at [facility], [address] [place], [place] [address]    Report Status PENDING  Incomplete  Culture, blood (Routine X 2) w Reflex to ID Panel     Status: None (Preliminary result)   Collection Time: 04/27/23 11:04 PM   Specimen: BLOOD  Result Value Ref Range Status   Specimen Description BLOOD BLOOD RIGHT HAND  Final   Special Requests   Final    BOTTLES DRAWN AEROBIC ONLY Blood Culture results may not be optimal due to an inadequate volume of blood received in culture bottles   Culture   Final    NO GROWTH 3 DAYS Performed at [facility], [address], [place], [place] [address]    Report Status PENDING  Incomplete         Radiology Studies: No results found.      Scheduled Meds:  atorvastatin  80 mg Oral Daily    Chlorhexidine Gluconate Cloth  6 each Topical Q0600   clopidogrel  75 mg Oral Daily   escitalopram  20 mg Oral q morning   ezetimibe  10 mg Oral Daily   feeding supplement  237 mL Oral BID BM   gabapentin  100 mg Oral TID   insulin aspart  0-9 Units Subcutaneous TID WC   insulin glargine-yfgn  10 Units Subcutaneous Daily   methimazole  5 mg Oral q morning   metoprolol tartrate  100 mg Oral BID   pantoprazole  40 mg Oral BID   potassium chloride  20 mEq Oral BID   ranolazine  500 mg Oral BID   rivaroxaban  10 mg Oral Daily   tamsulosin  0.4 mg Oral QHS   topiramate  25 mg Oral BID   Continuous Infusions:   LOS: 3 days    Time spent: 52 minutes    Dorcas Carrow, MD [facility]

## 2023-04-30 NOTE — Progress Notes (Signed)
 Speech Language Pathology Treatment: Dysphagia  Patient Details Name: Lori [name] MRN: [id number] DOB: 03-16-1970 Today's Date: 04/30/2023 Time: 5638-7564 SLP Time Calculation (min) (ACUTE ONLY): 10 min  Assessment / Plan / Recommendation Clinical Impression  Per discussion with RN and NT, no overt concerns for aspiration or poor diet tolerance with current D3 diet and thin liquids. Pt continues to need some feeding assistance. SLP observed pt consume diced fruit x4-5 bites and take sips of thin liquids by straw (2-3 oz) with no overt or subtle s/s of aspiration. Oral prep and transit remains mildly prolonged with trace lingual residue observed after initial swallow. Residue cleared with prompted liquid wash. Pharyngeal swallow initiation appears prompt with laryngeal elevation noted. Pt remains on room air and remains afebrile. WBC is WNL.  Last CXR on 04/27/23 demonstrated "no active disease".   Anticipate Dysphagia 3- mechanical soft diet is pt's least restrictive diet at this time. Pt demonstrating good diet tolerance of PO intake without overt concerns for pulmonary compromise. SLP will sign off. Please re-consult if pt exhibits concerns for aspiration with PO intake.   HPI HPI: Lori [name] is a 53 yo female presenting to ED 3/23 with emesis and wrist pain after a recent fall. CXR and CT Abdomen/Pelvis negative for acute finding. Seen by SLP most recently 02/2023 with recommendations to continue baseline diet of Dys 3 solids with thin liquids secondary to orofacial weakness and edentulism s/p CVA 2023. PMH includes T2DM, gastroparesis, CAD s/p CABG, chronic diastolic congestive heart failure, history of CVA with residual L sided weakness/primarily bedbound status, DVT on Xarelto, pheochromocytoma s/p R adrenalectomy, hypothyroidism, OSA on CPAP      SLP Plan  All goals met      Recommendations for follow up therapy are one component of a multi-disciplinary  discharge planning process, led by the attending physician.  Recommendations may be updated based on patient status, additional functional criteria and insurance authorization.    Recommendations  Diet recommendations: Dysphagia 3 (mechanical soft);Thin liquid Medication Administration: Whole meds with puree Compensations: Minimize environmental distractions;Slow rate;Small sips/bites Postural Changes and/or Swallow Maneuvers: Seated upright 90 degrees                  Oral care BID     Dysphagia, oral phase (R13.11)     All goals met     [name]  04/30/2023, 9:06 AM

## 2023-04-30 NOTE — TOC Progression Note (Signed)
 Transition of Care [facility]) - Progression Note    Patient Details  Name: Lori [name] MRN: [id number] Date of Birth: [birth year]  [facility]) CM/SW Contact  Eduard Roux, [place] Phone Number: 04/30/2023, 12:34 PM  Clinical Narrative:     Received call from Olam Idler owner of [facility] @ [phone]. She advised the patient receives home care 7 days per week for 2:30p - 5:30p.  However, the patient is being evaluated by Counsellor for increased personal care hours to 6-7 hrs per day and will be evaluated once she returns home.  [place] also shared they are assisting patient with securing income base housing w/ [facility]. [facility] states they have been caring for the patient for over 3 yrs and the husband does not actively participate in the patient's care. She states the patient expressed  to her she does not want to go to SNF, she wants to discharge home.   Antony Blackbird, MSW, LCSW Clinical Social Worker      Expected Discharge Plan: Home w Home Health Services Barriers to Discharge: Continued Medical Work up  Expected Discharge Plan and Services In-house Referral: Clinical Social Work     Living arrangements for the past 2 months: Single Family Home                                       Social Determinants of Health (SDOH) Interventions SDOH Screenings   Food Insecurity: Patient Unable To Answer (04/28/2023)  Housing: Patient Unable To Answer (04/28/2023)  Transportation Needs: Patient Unable To Answer (04/28/2023)  Utilities: Patient Unable To Answer (04/28/2023)  Depression (PHQ2-9): Low Risk  (04/03/2023)  Tobacco Use: Low Risk  (04/27/2023)    Readmission Risk Interventions    12/28/2022   11:30 AM 11/05/2022    1:38 PM 10/15/2022   10:25 AM  Readmission Risk Prevention Plan  Transportation Screening Complete Complete Complete  Medication Review Oceanographer)  Complete Complete   PCP or Specialist appointment within 3-5 days of discharge Complete Complete Complete  HRI or Home Care Consult Complete Complete Complete  SW Recovery Care/Counseling Consult Complete Complete Complete  Palliative Care Screening Not Applicable Not Applicable Not Applicable  Skilled Nursing Facility Not Applicable Not Applicable Not Applicable

## 2023-05-01 DIAGNOSIS — N170 Acute kidney failure with tubular necrosis: Secondary | ICD-10-CM | POA: Diagnosis not present

## 2023-05-01 DIAGNOSIS — E034 Atrophy of thyroid (acquired): Secondary | ICD-10-CM

## 2023-05-01 LAB — GLUCOSE, CAPILLARY
Glucose-Capillary: 139 mg/dL — ABNORMAL HIGH (ref 70–99)
Glucose-Capillary: 170 mg/dL — ABNORMAL HIGH (ref 70–99)

## 2023-05-01 MED ORDER — POTASSIUM CHLORIDE CRYS ER 20 MEQ PO TBCR: 20.0000 meq | EXTENDED_RELEASE_TABLET | Freq: Two times a day (BID) | ORAL | 0 refills | Status: DC

## 2023-05-01 MED ORDER — LANTUS SOLOSTAR 100 UNIT/ML ~~LOC~~ SOPN: 10.0000 [IU] | PEN_INJECTOR | Freq: Every day | SUBCUTANEOUS | Status: DC

## 2023-05-01 NOTE — TOC Transition Note (Signed)
 Transition of Care (TOC) - Discharge Note Donn Pierini RN,BSN Transitions of Care Unit 4NP (Non Trauma)- RN Case Manager See Treatment Team for direct Phone #   Patient Details  Name: Lori [name] MRN: [id number] Date of Birth: November 10, 1970  [facility]) CM/SW Contact:  Darrold Span, RN Phone Number: 05/01/2023, 3:01 PM   Clinical Narrative:    Pt stable for transition home today, Pt active with PCS/CAPS aides and will be working to see if she can increase hours for aid services. Returning home w/ spouse.  Pt has had HH services in the past not currently active with [facility] per [facility]. Has needed DME at home.   [facility] called for transport- per dispatch ETA for transport is 2hrs or more. Paperwork placed on chart.  Bedside RN updated.   No further TOC needs noted at this time.    Final next level of care: Home w Home Health Services Barriers to Discharge: Barriers Resolved   Patient Goals and CMS Choice Patient states their goals for this hospitalization and ongoing recovery are:: return home   Choice offered to / list presented to : NA      Discharge Placement               Home        Discharge Plan and Services Additional resources added to the After Visit Summary for   In-house Referral: Clinical Social Work Discharge Planning Services: CM Consult Post Acute Care Choice: NA          DME Arranged: N/A DME Agency: NA       HH Arranged: NA HH Agency: NA        Social Drivers of Health (SDOH) Interventions SDOH Screenings   Food Insecurity: Patient Unable To Answer (04/28/2023)  Housing: Patient Unable To Answer (04/28/2023)  Transportation Needs: Patient Unable To Answer (04/28/2023)  Utilities: Patient Unable To Answer (04/28/2023)  Depression (PHQ2-9): Low Risk  (04/03/2023)  Tobacco Use: Low Risk  (04/27/2023)     Readmission Risk Interventions    05/01/2023    3:01 PM 12/28/2022   11:30 AM 11/05/2022     1:38 PM  Readmission Risk Prevention Plan  Transportation Screening Complete Complete Complete  Medication Review Oceanographer) Complete Complete Complete  PCP or Specialist appointment within 3-5 days of discharge Complete Complete Complete  HRI or Home Care Consult Complete Complete Complete  SW Recovery Care/Counseling Consult Complete Complete Complete  Palliative Care Screening Not Applicable Not Applicable Not Applicable  Skilled Nursing Facility Patient Refused Not Applicable Not Applicable

## 2023-05-01 NOTE — Plan of Care (Signed)
 Problem: Education: Goal: Ability to describe self-care measures that may prevent or decrease complications (Diabetes Survival Skills Education) will improve 05/01/2023 0447 by Carlene Coria, RN Outcome: Progressing 04/30/2023 2311 by Carlene Coria, RN Outcome: Progressing 04/30/2023 2306 by Carlene Coria, RN Outcome: Progressing Goal: Individualized Educational Video(s) 05/01/2023 0447 by Carlene Coria, RN Outcome: Progressing 04/30/2023 2311 by Carlene Coria, RN Outcome: Progressing 04/30/2023 2306 by Carlene Coria, RN Outcome: Progressing   Problem: Coping: Goal: Ability to adjust to condition or change in health will improve 05/01/2023 0447 by Carlene Coria, RN Outcome: Progressing 04/30/2023 2311 by Carlene Coria, RN Outcome: Progressing 04/30/2023 2306 by Carlene Coria, RN Outcome: Progressing   Problem: Fluid Volume: Goal: Ability to maintain a balanced intake and output will improve 05/01/2023 0447 by Carlene Coria, RN Outcome: Progressing 04/30/2023 2311 by Carlene Coria, RN Outcome: Progressing 04/30/2023 2306 by Carlene Coria, RN Outcome: Progressing   Problem: Health Behavior/Discharge Planning: Goal: Ability to identify and utilize available resources and services will improve 05/01/2023 0447 by Carlene Coria, RN Outcome: Progressing 04/30/2023 2311 by Carlene Coria, RN Outcome: Progressing 04/30/2023 2306 by Carlene Coria, RN Outcome: Progressing Goal: Ability to manage health-related needs will improve 05/01/2023 0447 by Carlene Coria, RN Outcome: Progressing 04/30/2023 2311 by Carlene Coria, RN Outcome: Progressing 04/30/2023 2306 by Carlene Coria, RN Outcome: Progressing   Problem: Metabolic: Goal: Ability to maintain appropriate glucose levels will improve 05/01/2023 0447 by Carlene Coria, RN Outcome: Progressing 04/30/2023 2311 by Carlene Coria, RN Outcome:  Progressing 04/30/2023 2306 by Carlene Coria, RN Outcome: Progressing   Problem: Nutritional: Goal: Maintenance of adequate nutrition will improve 05/01/2023 0447 by Carlene Coria, RN Outcome: Progressing 04/30/2023 2311 by Carlene Coria, RN Outcome: Progressing 04/30/2023 2306 by Carlene Coria, RN Outcome: Progressing Goal: Progress toward achieving an optimal weight will improve 05/01/2023 0447 by Carlene Coria, RN Outcome: Progressing 04/30/2023 2311 by Carlene Coria, RN Outcome: Progressing 04/30/2023 2306 by Carlene Coria, RN Outcome: Progressing   Problem: Skin Integrity: Goal: Risk for impaired skin integrity will decrease 05/01/2023 0447 by Carlene Coria, RN Outcome: Progressing 04/30/2023 2311 by Carlene Coria, RN Outcome: Progressing 04/30/2023 2306 by Carlene Coria, RN Outcome: Progressing   Problem: Tissue Perfusion: Goal: Adequacy of tissue perfusion will improve 05/01/2023 0447 by Carlene Coria, RN Outcome: Progressing 04/30/2023 2311 by Carlene Coria, RN Outcome: Progressing 04/30/2023 2306 by Carlene Coria, RN Outcome: Progressing   Problem: Education: Goal: Ability to describe self-care measures that may prevent or decrease complications (Diabetes Survival Skills Education) will improve 05/01/2023 0447 by Carlene Coria, RN Outcome: Progressing 04/30/2023 2311 by Carlene Coria, RN Outcome: Progressing 04/30/2023 2306 by Carlene Coria, RN Outcome: Progressing Goal: Individualized Educational Video(s) 05/01/2023 0447 by Carlene Coria, RN Outcome: Progressing 04/30/2023 2311 by Carlene Coria, RN Outcome: Progressing 04/30/2023 2306 by Carlene Coria, RN Outcome: Progressing   Problem: Cardiac: Goal: Ability to maintain an adequate cardiac output will improve 05/01/2023 0447 by Carlene Coria, RN Outcome: Progressing 04/30/2023 2311 by Carlene Coria, RN Outcome:  Progressing 04/30/2023 2306 by Carlene Coria, RN Outcome: Progressing   Problem: Health Behavior/Discharge Planning: Goal: Ability to identify and utilize available resources and services will improve 05/01/2023 0447 by Carlene Coria, RN Outcome: Progressing 04/30/2023 2311 by Carlene Coria, RN Outcome: Progressing 04/30/2023 2306 by Donna Bernard  Ruben Im, RN Outcome: Progressing Goal: Ability to manage health-related needs will improve 05/01/2023 0447 by Carlene Coria, RN Outcome: Progressing 04/30/2023 2311 by Carlene Coria, RN Outcome: Progressing 04/30/2023 2306 by Carlene Coria, RN Outcome: Progressing   Problem: Fluid Volume: Goal: Ability to achieve a balanced intake and output will improve 05/01/2023 0447 by Carlene Coria, RN Outcome: Progressing 04/30/2023 2311 by Carlene Coria, RN Outcome: Progressing 04/30/2023 2306 by Carlene Coria, RN Outcome: Progressing   Problem: Metabolic: Goal: Ability to maintain appropriate glucose levels will improve 05/01/2023 0447 by Carlene Coria, RN Outcome: Progressing 04/30/2023 2311 by Carlene Coria, RN Outcome: Progressing 04/30/2023 2306 by Carlene Coria, RN Outcome: Progressing   Problem: Nutritional: Goal: Maintenance of adequate nutrition will improve 05/01/2023 0447 by Carlene Coria, RN Outcome: Progressing 04/30/2023 2311 by Carlene Coria, RN Outcome: Progressing 04/30/2023 2306 by Carlene Coria, RN Outcome: Progressing Goal: Maintenance of adequate weight for body size and type will improve 05/01/2023 0447 by Carlene Coria, RN Outcome: Progressing 04/30/2023 2311 by Carlene Coria, RN Outcome: Progressing 04/30/2023 2306 by Carlene Coria, RN Outcome: Progressing   Problem: Respiratory: Goal: Will regain and/or maintain adequate ventilation 05/01/2023 0447 by Carlene Coria, RN Outcome: Progressing 04/30/2023 2311 by Carlene Coria,  RN Outcome: Progressing 04/30/2023 2306 by Carlene Coria, RN Outcome: Progressing   Problem: Urinary Elimination: Goal: Ability to achieve and maintain adequate renal perfusion and functioning will improve 05/01/2023 0447 by Carlene Coria, RN Outcome: Progressing 04/30/2023 2311 by Carlene Coria, RN Outcome: Progressing 04/30/2023 2306 by Carlene Coria, RN Outcome: Progressing   Problem: Education: Goal: Knowledge of General Education information will improve Description: Including pain rating scale, medication(s)/side effects and non-pharmacologic comfort measures 05/01/2023 0447 by Carlene Coria, RN Outcome: Progressing 04/30/2023 2311 by Carlene Coria, RN Outcome: Progressing 04/30/2023 2306 by Carlene Coria, RN Outcome: Progressing   Problem: Health Behavior/Discharge Planning: Goal: Ability to manage health-related needs will improve 05/01/2023 0447 by Carlene Coria, RN Outcome: Progressing 04/30/2023 2311 by Carlene Coria, RN Outcome: Progressing 04/30/2023 2306 by Carlene Coria, RN Outcome: Progressing   Problem: Clinical Measurements: Goal: Ability to maintain clinical measurements within normal limits will improve 05/01/2023 0447 by Carlene Coria, RN Outcome: Progressing 04/30/2023 2311 by Carlene Coria, RN Outcome: Progressing 04/30/2023 2306 by Carlene Coria, RN Outcome: Progressing Goal: Will remain free from infection 05/01/2023 0447 by Carlene Coria, RN Outcome: Progressing 04/30/2023 2311 by Carlene Coria, RN Outcome: Progressing 04/30/2023 2306 by Carlene Coria, RN Outcome: Progressing Goal: Diagnostic test results will improve 05/01/2023 0447 by Carlene Coria, RN Outcome: Progressing 04/30/2023 2311 by Carlene Coria, RN Outcome: Progressing 04/30/2023 2306 by Carlene Coria, RN Outcome: Progressing Goal: Respiratory complications will improve 05/01/2023 0447 by Carlene Coria, RN Outcome: Progressing 04/30/2023 2311 by Carlene Coria, RN Outcome: Progressing 04/30/2023 2306 by Carlene Coria, RN Outcome: Progressing Goal: Cardiovascular complication will be avoided 05/01/2023 0447 by Carlene Coria, RN Outcome: Progressing 04/30/2023 2311 by Carlene Coria, RN Outcome: Progressing 04/30/2023 2306 by Carlene Coria, RN Outcome: Progressing   Problem: Activity: Goal: Risk for activity intolerance will decrease 05/01/2023 0447 by Carlene Coria, RN Outcome: Progressing 04/30/2023 2311 by Carlene Coria, RN Outcome: Progressing 04/30/2023 2306 by Carlene Coria, RN Outcome: Progressing   Problem: Nutrition: Goal: Adequate nutrition will be maintained 05/01/2023 0447  by Carlene Coria, RN Outcome: Progressing 04/30/2023 2311 by Carlene Coria, RN Outcome: Progressing 04/30/2023 2306 by Carlene Coria, RN Outcome: Progressing   Problem: Coping: Goal: Level of anxiety will decrease 05/01/2023 0447 by Carlene Coria, RN Outcome: Progressing 04/30/2023 2311 by Carlene Coria, RN Outcome: Progressing 04/30/2023 2306 by Carlene Coria, RN Outcome: Progressing   Problem: Elimination: Goal: Will not experience complications related to bowel motility 05/01/2023 0447 by Carlene Coria, RN Outcome: Progressing 04/30/2023 2311 by Carlene Coria, RN Outcome: Progressing 04/30/2023 2306 by Carlene Coria, RN Outcome: Progressing Goal: Will not experience complications related to urinary retention 05/01/2023 0447 by Carlene Coria, RN Outcome: Progressing 04/30/2023 2311 by Carlene Coria, RN Outcome: Progressing 04/30/2023 2306 by Carlene Coria, RN Outcome: Progressing   Problem: Pain Managment: Goal: General experience of comfort will improve and/or be controlled 05/01/2023 0447 by Carlene Coria, RN Outcome: Progressing 04/30/2023 2311 by Carlene Coria, RN Outcome:  Progressing 04/30/2023 2306 by Carlene Coria, RN Outcome: Progressing   Problem: Safety: Goal: Ability to remain free from injury will improve 05/01/2023 0447 by Carlene Coria, RN Outcome: Progressing 04/30/2023 2311 by Carlene Coria, RN Outcome: Progressing 04/30/2023 2306 by Carlene Coria, RN Outcome: Progressing   Problem: Skin Integrity: Goal: Risk for impaired skin integrity will decrease 05/01/2023 0447 by Carlene Coria, RN Outcome: Progressing 04/30/2023 2311 by Carlene Coria, RN Outcome: Progressing 04/30/2023 2306 by Carlene Coria, RN Outcome: Progressing

## 2023-05-01 NOTE — Progress Notes (Signed)
 Physical Therapy Treatment Patient Details Name: Lori [name] MRN: [id number] DOB: 1970/05/15 Today's Date: 05/01/2023   History of Present Illness 53 y/o female presents to [facility] on 03/23 secondary to emesis and wrist pain; poor historian, currently confused. 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 PMH includes: DMII with gastroparesis, CAD status post CABG in [place], chronic diastolic heart failure, OSA on CPAP, history of CVA with residual left-sided weakness and currently bedbound, history of DVT on Xarelto, pheochromocytoma status post right adrenalectomy, hypothyroidism.    PT Comments  Patient agreeable to participate with therapy today. Patient demonstrates improved alertness and is oriented to self, location, and time. Patient with improved verbalizations throughout the entirety of today's session. The focus of today's session consisted on re-assessing bed mobility and transfers. Patient requires ModA+2 for bed mobility and transfers. Patient demonstrates good initiation needed for sit > stand and stand-pivot transfer from bed to chair with HHA+2. If PCA can provide assistance with ADLs/IADLs, the patient would be appropriate to be discharged home. At this time, Acute PT will continue to follow up and progress the patient, as recommendations for continued inpatient follow up therapy, <3 hours/day would suffice to improve her functional independence.      If plan is discharge home, recommend the following: Two people to help with walking and/or transfers;A lot of help with bathing/dressing/bathroom;Assistance with cooking/housework;Assist for transportation;Help with stairs or ramp for entrance   Can travel by private vehicle     No  Equipment Recommendations  Hoyer lift    Recommendations for Other  Services       Precautions / Restrictions Precautions Precautions: Fall Recall of Precautions/Restrictions: Intact Precaution/Restrictions Comments: L Wrist Splint Required Braces or Orthoses: Splint/Cast Splint/Cast: forearm based thumb spica pre-fab splint Restrictions Weight Bearing Restrictions Per Provider Order: No     Mobility  Bed Mobility Overal bed mobility: Needs Assistance Bed Mobility: Supine to Sit     Supine to sit: Mod assist, +2 for physical assistance     General bed mobility comments: Pt with good initiation moving BLE towards EOB, ModA for trunk elevation and patient able to move legs EOB    Transfers Overall transfer level: Needs assistance Equipment used: 2 person hand held assist Transfers: Sit to/from Stand, Bed to chair/wheelchair/BSC Sit to Stand: Mod assist, +2 safety/equipment Stand pivot transfers: Mod assist, +2 safety/equipment         General transfer comment: Patient able to power herself using LE support with ModA, good initiation, HR increased to (110-114bpm)    Ambulation/Gait                   Stairs             Wheelchair Mobility     Tilt Bed    Modified Rankin (Stroke Patients Only)       Balance Overall balance assessment: Needs assistance Sitting-balance support: Bilateral upper extremity supported, Feet supported Sitting balance-Leahy Scale: Fair Sitting balance - Comments: static sitting EOB Postural control: Right lateral lean Standing balance support: Bilateral upper extremity supported Standing balance-Leahy Scale: Poor                              Communication Communication Communication: Impaired Factors Affecting Communication: Reduced clarity of speech  Cognition Arousal: Alert Behavior During Therapy: WFL for tasks assessed/performed  PT - Cognition Comments: Patient more A&O during today's session, able to recall where she is,  date/time, and self. Improved verbalizations, intermittently unclear Following commands: Impaired Following commands impaired: Follows one step commands with increased time    Cueing Cueing Techniques: Verbal cues, Gestural cues, Tactile cues  Exercises      General Comments General comments (skin integrity, edema, etc.): VSS - HR elevated to 110-114bpm with activity      Pertinent Vitals/Pain Pain Assessment Pain Assessment: Faces Faces Pain Scale: Hurts little more Pain Location: L hand/wrist and abdomen Pain Descriptors / Indicators: Grimacing, Guarding Pain Intervention(s): Limited activity within patient's tolerance, Monitored during session, Relaxation, Repositioned    Home Living                          Prior Function            PT Goals (current goals can now be found in the care plan section) Acute Rehab PT Goals Patient Stated Goal: Wants to return home and Walk PT Goal Formulation: Patient unable to participate in goal setting Time For Goal Achievement: 05/12/23 Potential to Achieve Goals: Fair Progress towards PT goals: Progressing toward goals    Frequency    Min 1X/week      PT Plan      Co-evaluation              AM-PAC PT "6 Clicks" Mobility   Outcome Measure  Help needed turning from your back to your side while in a flat bed without using bedrails?: A Lot Help needed moving from lying on your back to sitting on the side of a flat bed without using bedrails?: A Lot Help needed moving to and from a bed to a chair (including a wheelchair)?: A Lot Help needed standing up from a chair using your arms (e.g., wheelchair or bedside chair)?: A Lot Help needed to walk in hospital room?: Total Help needed climbing 3-5 steps with a railing? : Total 6 Click Score: 10    End of Session Equipment Utilized During Treatment: Gait belt Activity Tolerance: Patient tolerated treatment well Patient left: in chair;with call bell/phone within  reach;with chair alarm set Nurse Communication: Mobility status;Need for lift equipment PT Visit Diagnosis: Other abnormalities of gait and mobility (R26.89);Muscle weakness (generalized) (M62.81);History of falling (Z91.81);Difficulty in walking, not elsewhere classified (R26.2);Other symptoms and signs involving the nervous system (R29.898);Hemiplegia and hemiparesis;Pain Hemiplegia - Right/Left: Left Hemiplegia - caused by: Unspecified Pain - Right/Left: Left Pain - part of body: Hand (Left Hand and Abdomen)     Time: 9562-1308 PT Time Calculation (min) (ACUTE ONLY): 33 min  Charges:    $Therapeutic Activity: 23-37 mins PT General Charges $$ ACUTE PT VISIT: 1 Visit                    Doreen Beam, SPT   Isami Mehra 05/01/2023, 10:45 AM

## 2023-05-01 NOTE — Progress Notes (Signed)
 Occupational Therapy Treatment Patient Details Name: Lori [name] MRN: [id number] DOB: [birth year] Today's Date: 05/01/2023   History of present illness 53 y/o female presents [facility] on 03/23 secondary to emesis and wrist pain; poor historian, currently confused. 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 PMH includes: DMII with gastroparesis, CAD status post CABG in [place], chronic diastolic heart failure, OSA on CPAP, history of CVA with residual left-sided weakness and currently bedbound, history of DVT on Xarelto, pheochromocytoma status post right adrenalectomy, hypothyroidism.   OT comments  Patient received up in recliner and agreeable to OT treatment patient able to participate in grooming tasks while seated up in recliner with mod assist and HoH assist to initiate face washing. Patient tolerated PROM to LUE shoulder, elbow, and hand with complaints of pain with finger and wrist flexion/extension. Retrograde message performed to LUE hand to address edema. Patient asking to return to bed at end of session and required max assist for transfers and bed mobility. Patient positioned in bed to address LUE hand edema. Patient will benefit from continued inpatient follow up therapy, <3 hours/day. Acute OT to continue to follow to address established goals to facilitate DC to next venue of care.        If plan is discharge home, recommend the following:  Two people to help with walking and/or transfers;A lot of help with bathing/dressing/bathroom;Assistance with cooking/housework;Assistance with feeding;Direct supervision/assist for medications management;Direct supervision/assist for financial management;Assist for transportation;Help with stairs or ramp for entrance;Supervision due to cognitive status   Equipment  Recommendations  Other (comment) (defer)    Recommendations for Other Services      Precautions / Restrictions Precautions Precautions: Fall Recall of Precautions/Restrictions: Intact Precaution/Restrictions Comments: L Wrist Splint Required Braces or Orthoses: Splint/Cast Splint/Cast: forearm based thumb spica pre-fab splint Restrictions Weight Bearing Restrictions Per Provider Order: No       Mobility Bed Mobility Overal bed mobility: Needs Assistance Bed Mobility: Sit to Supine       Sit to supine: Max assist, +2 for physical assistance   General bed mobility comments: patient fatigued from sitting in recliner and required max assist +2 to go to supine    Transfers Overall transfer level: Needs assistance Equipment used: None Transfers: Sit to/from Stand, Bed to chair/wheelchair/BSC Sit to Stand: Max assist Stand pivot transfers: Max assist         General transfer comment: face to face technique performed due to fatigue     Balance Overall balance assessment: Needs assistance Sitting-balance support: Bilateral upper extremity supported, Feet supported Sitting balance-Leahy Scale: Fair Sitting balance - Comments: static sitting EOB                                   ADL either performed or assessed with clinical judgement   ADL Overall ADL's : Needs assistance/impaired     Grooming: Sitting;Cueing for sequencing;Moderate assistance Grooming Details (indicate cue type and reason): HoH to peform                                    Extremity/Trunk Assessment Upper Extremity Assessment Upper Extremity Assessment: LUE deficits/detail LUE Deficits / Details: residual weakness and L hand contracture from previous stroke. Pt keeps LUE adducted and reports  being unable to extend elbow. Pt expressed severe pain with PROM to L hand LUE Coordination: decreased fine motor;decreased gross motor            Vision   Vision  Assessment?: No apparent visual deficits   Perception     Praxis     Communication Communication Communication: Impaired Factors Affecting Communication: Reduced clarity of speech   Cognition Arousal: Alert Behavior During Therapy: WFL for tasks assessed/performed Cognition: No family/caregiver present to determine baseline, Cognition impaired, Difficult to assess Difficult to assess due to: Impaired communication Orientation impairments: Situation Awareness: Intellectual awareness impaired, Online awareness impaired   Attention impairment (select first level of impairment): Focused attention Executive functioning impairment (select all impairments): Initiation, Organization, Sequencing, Reasoning, Problem solving OT - Cognition Comments: patient difficult to understand at times                 Following commands: Impaired Following commands impaired: Follows one step commands with increased time      Cueing   Cueing Techniques: Verbal cues, Gestural cues, Tactile cues  Exercises Exercises: General Upper Extremity General Exercises - Upper Extremity Shoulder Flexion: PROM, Left, 10 reps, Seated Shoulder ABduction: PROM, Left, 10 reps, Seated Shoulder Horizontal ABduction: PROM, Left, 10 reps, Seated Elbow Flexion: PROM, Left, 10 reps, Seated Elbow Extension: PROM, Left, 10 reps, Seated Wrist Flexion: PROM, Left, 10 reps, Seated Wrist Extension: PROM, Left, 10 reps, Seated    Shoulder Instructions       General Comments VSS on RA    Pertinent Vitals/ Pain       Pain Assessment Pain Assessment: Faces Faces Pain Scale: Hurts little more Pain Location: L hand/wrist with ROM Pain Descriptors / Indicators: Grimacing, Guarding Pain Intervention(s): Limited activity within patient's tolerance, Monitored during session, Repositioned  Home Living                                          Prior Functioning/Environment              Frequency   Min 2X/week        Progress Toward Goals  OT Goals(current goals can now be found in the care plan section)  Progress towards OT goals: Progressing toward goals  Acute Rehab OT Goals Patient Stated Goal: none stated OT Goal Formulation: Patient unable to participate in goal setting Time For Goal Achievement: 05/12/23 Potential to Achieve Goals: Fair ADL Goals Pt Will Perform Grooming: with set-up;sitting Pt Will Perform Upper Body Dressing: with mod assist;sitting Pt Will Perform Lower Body Dressing: with max assist Pt Will Transfer to Toilet: with mod assist;stand pivot transfer;bedside commode Pt/caregiver will Perform Home Exercise Program: Increased ROM;Left upper extremity;With minimal assist;With written HEP provided Additional ADL Goal #1: Pt will follow one-step verbal commands consistently to improve independence in functional tasks  Plan      Co-evaluation                 AM-PAC OT "6 Clicks" Daily Activity     Outcome Measure   Help from another person eating meals?: Total Help from another person taking care of personal grooming?: A Lot Help from another person toileting, which includes using toliet, bedpan, or urinal?: Total Help from another person bathing (including washing, rinsing, drying)?: A Lot Help from another person to put on and taking off regular upper body clothing?: A Lot Help from another  person to put on and taking off regular lower body clothing?: Total 6 Click Score: 9    End of Session Equipment Utilized During Treatment: Gait belt  OT Visit Diagnosis: Muscle weakness (generalized) (M62.81);Unsteadiness on feet (R26.81)   Activity Tolerance Patient tolerated treatment well   Patient Left in bed;with call bell/phone within reach;with bed alarm set   Nurse Communication Mobility status        Time: 0454-0981 OT Time Calculation (min): 39 min  Charges: OT General Charges $OT Visit: 1 Visit OT Treatments $Self Care/Home  Management : 8-22 mins $Therapeutic Activity: 8-22 mins $Therapeutic Exercise: 8-22 mins  Alfonse Flavors, OTA Acute Rehabilitation Services  Office [phone]   Dewain Penning 05/01/2023, 2:02 PM

## 2023-05-01 NOTE — Discharge Summary (Signed)
 Physician Discharge Summary  Lori [name] WJX:[id number] DOB: Dec 02, 1970 DOA: 04/27/2023  PCP: Caesar Bookman, NP  Admit date: 04/27/2023 Discharge date: 05/01/2023  Admitted From: Home Disposition: Home  Recommendations for Outpatient Follow-up:  Follow up with PCP in 1-2 weeks Please obtain BMP/CBC in one week   Home Health: N/A Equipment/Devices: Present at home  Discharge Condition: Fair CODE STATUS: Full code Diet recommendation: Regular diet  Discharge summary: Patient is 53 year old with history of type 2 diabetes and gastroparesis, CAD status post CABG, chronic diastolic heart failure, history of stroke with residual left-sided weakness, left upper extremity contracture, DVT on Xarelto, pheochromocytoma status post right adrenalectomy, hypothyroidism, sleep apnea on CPAP brought from home to the emergency room with nausea, vomiting, left wrist pain after recent fall. Patient was found confused and poor historian. Family reported poor appetite, severe nausea and vomiting. In the emergency room afebrile. Heart rate 120. On room air. Glucose 331, creatinine 3.94. Lactic acid 4.3. Beta hydroxybutyrate 1.96. Urinalysis without any abnormalities. UDS negative. Chest x-ray normal. Left wrist x-ray with suspicious volar intercalated segmental instability. CT scan abdomen pelvis with no acute findings. Patient was admitted to the hospital with clinical dehydration, AKI.   # Diabetic ketoacidosis in a patient with type 2 diabetes, gastroparesis.  Severe dehydration and lactic acidosis. Presented with nausea vomiting for 2 days.  Blood glucose 343, anion gap 19 and beta-hydroxybutyrate 1.96, lactic acid 4.3.  Infection workup was negative.  Treated with IV fluids and insulin infusion with improvement.  Now back on subcu insulin.  Blood sugars are acceptable. Blood sugars has improved.  Her appetite is still poor.  Will go home on sliding scale, long-acting insulin 10 units  daily instead of 14 units daily. Presented with creatinine of 3.94 likely secondary to prerenal azotemia and dehydration in the setting of DKA.  Treated with fluid.  Renal functions normalized.  Encourage oral intake.   # Left wrist injury: Suspected volar intercalated segmental instability: Seen by hand surgery.  Wrist splint.  Weightbearing as tolerated.  Pain medications. Patient without functional left hand.   # Acute metabolic encephalopathy due to above.  Clinically improved and normalized.   History of stroke with residual left hemiaplasia, left upper extremity contractures: Neurologically stable.  Mostly bedbound and wheelchair dependent at home. PT OT consulted.  Recommended SNF.  Patient wants to go home.   Hypothyroidism: TSH 0.47.  Back on methimazole.   Hyperlipidemia: On atorvastatin and Zetia.   History of DVT on anticoagulation: On maintenance Xarelto.  Continue.   Major depressive disorder and anxiety: On Lexapro.  Go back on home dose of Ativan.   Sleep apnea: On CPAP at night.   Coronary artery disease status post CABG: On Plavix.  Resume metoprolol and Ranexa. Holding on olmesartan until outpatient follow-up with high risk of dehydration and suffered AKI.  Multiple chronic medical issues but currently stable.  Discharge Diagnoses:  Principal Problem:   Acute renal failure (ARF) (HCC) Active Problems:   Essential hypertension   Chronic diastolic CHF (congestive heart failure) (HCC)   CAD (coronary artery disease)   Debility   Hypothyroidism   GERD (gastroesophageal reflux disease)   History of DVT (deep vein thrombosis)   Carotid stenosis, bilateral   Cerebrovascular disease   Anticoagulated    Discharge Instructions  Discharge Instructions     Diet - low sodium heart healthy   Complete by: As directed    Increase activity slowly   Complete by: As directed  Allergies as of 05/01/2023       Reactions   Asa [aspirin] Anaphylaxis,  Swelling, Other (See Comments)   Throat closes   Contrast Media [iodinated Contrast Media] Anaphylaxis, Swelling   Imitrex [sumatriptan] Anaphylaxis   Ms Contin [morphine] Anaphylaxis, Swelling   Penicillins Swelling, Other (See Comments)   Mouth and eyes swell   Firvanq [vancomycin] Other (See Comments)   Red Man's Syndrome   Cipro [ciprofloxacin Hcl] Nausea And Vomiting   Nitrofuran Derivatives Anxiety   Wound Dressing Adhesive Itching, Rash        Medication List     PAUSE taking these medications    olmesartan-hydrochlorothiazide 40-25 MG tablet Wait to take this until: May 15, 2023 Commonly known as: BENICAR HCT Take 1 tablet by mouth daily.       STOP taking these medications    Gimoti 15 MG/ACT Soln Generic drug: Metoclopramide HCl   Ozempic (2 MG/DOSE) 8 MG/3ML Sopn Generic drug: Semaglutide (2 MG/DOSE)       TAKE these medications    acetaminophen 325 MG tablet Commonly known as: TYLENOL Take 2 tablets (650 mg total) by mouth every 6 (six) hours as needed for mild pain (pain score 1-3) (or Fever >/= 101).   albuterol 108 (90 Base) MCG/ACT inhaler Commonly known as: VENTOLIN HFA INHALE 2 PUFFS BY MOUTH EVERY 6 HOURS AS NEEDED FOR WHEEZING AND FOR SHORTNESS OF BREATH Strength: 108 (90 Base) MCG/ACT   albuterol (2.5 MG/3ML) 0.083% nebulizer solution Commonly known as: PROVENTIL Take 3 mLs (2.5 mg total) by nebulization every 6 (six) hours as needed for wheezing or shortness of breath.   atorvastatin 80 MG tablet Commonly known as: LIPITOR TAKE 1 TABLET (80 MG TOTAL) BY MOUTH DAILY.   baclofen 10 MG tablet Commonly known as: LIORESAL Take 1 tablet (10 mg total) by mouth 3 (three) times daily.   clopidogrel 75 MG tablet Commonly known as: PLAVIX TAKE 1 TABLET BY MOUTH DAILY   cyclobenzaprine 5 MG tablet Commonly known as: FLEXERIL TAKE 1 TABLET BY MOUTH 3 TIMES DAILY AS NEEDED FOR MUSCLE SPASMS What changed: when to take this    escitalopram 20 MG tablet Commonly known as: LEXAPRO Take 1 tablet (20 mg total) by mouth every morning.   ezetimibe 10 MG tablet Commonly known as: ZETIA TAKE 1 TABLET BY MOUTH DAILY   fluticasone 50 MCG/ACT nasal spray Commonly known as: FLONASE Place 2 sprays into both nostrils daily for 7 days.   FreeStyle Libre 14 Day Sensor Misc 1 each by Does not apply route as needed. DX: E11.69   FreeStyle Libre 2 Reader Devi 1 Device by Does not apply route daily. E11.69   gabapentin 100 MG capsule Commonly known as: NEURONTIN Take 100 mg by mouth 3 (three) times daily.   insulin lispro 100 UNIT/ML injection Commonly known as: HUMALOG Sliding scale AC, HS CBG 121 - 150: 2 units  CBG 151 - 200: 3 units  CBG 201 - 250: 5 units  CBG 251 - 300: 8 units  CBG 301 - 350: 11 units  CBG 351 - 400: 15 units  CBG > 400: call MD What changed:  how much to take how to take this when to take this additional instructions   Lantus SoloStar 100 UNIT/ML Solostar Pen Generic drug: insulin glargine Inject 10 Units into the skin at bedtime. What changed: how much to take   LORazepam 0.5 MG tablet Commonly known as: ATIVAN Take 1 tablet (0.5 mg total) by mouth  at bedtime.   methimazole 5 MG tablet Commonly known as: TAPAZOLE TAKE 1 TABLET BY MOUTH EVERY  MORNING   metoprolol tartrate 100 MG tablet Commonly known as: LOPRESSOR Take 1 tablet (100 mg total) by mouth 2 (two) times daily. Hold for SBP < 110   pantoprazole 40 MG tablet Commonly known as: PROTONIX Take 1 tablet (40 mg total) by mouth 2 (two) times daily.   potassium chloride SA 20 MEQ tablet Commonly known as: KLOR-CON M Take 1 tablet (20 mEq total) by mouth 2 (two) times daily for 2 days.   Prucalopride Succinate 2 MG Tabs Commonly known as: Motegrity Take 1 tablet (2 mg total) by mouth daily. LOT: [id number], EXP: 11-2023   ranolazine 500 MG 12 hr tablet Commonly known as: RANEXA Take 1 tablet (500 mg total) by  mouth 2 (two) times daily.   senna-docusate 8.6-50 MG tablet Commonly known as: Senokot-S Take 1 tablet by mouth 2 (two) times daily.   sorbitol 70 % Soln Take 30 mLs by mouth daily as needed for moderate constipation.   sucralfate 1 GM/10ML suspension Commonly known as: CARAFATE Take 10 mLs (1 g total) by mouth at bedtime.   tamsulosin 0.4 MG Caps capsule Commonly known as: FLOMAX Take 0.4 mg by mouth at bedtime.   topiramate 25 MG tablet Commonly known as: TOPAMAX Take 1 tablet (25 mg total) by mouth 2 (two) times daily.   Voltaren 1 % Gel Generic drug: diclofenac Sodium Apply 1 Application topically 4 (four) times daily as needed (pain).   Xarelto 10 MG Tabs tablet Generic drug: rivaroxaban TAKE 1 TABLET BY MOUTH DAILY        Allergies  Allergen Reactions   Asa [Aspirin] Anaphylaxis, Swelling and Other (See Comments)    Throat closes   Contrast Media [Iodinated Contrast Media] Anaphylaxis and Swelling   Imitrex [Sumatriptan] Anaphylaxis   Ms Contin [Morphine] Anaphylaxis and Swelling   Penicillins Swelling and Other (See Comments)    Mouth and eyes swell   Firvanq [Vancomycin] Other (See Comments)    Red Man's Syndrome   Cipro [Ciprofloxacin Hcl] Nausea And Vomiting   Nitrofuran Derivatives Anxiety   Wound Dressing Adhesive Itching and Rash    Consultations: None   Procedures/Studies: DG Chest Port 1 View Result Date: 04/27/2023 CLINICAL DATA:  Lactic acidosis EXAM: PORTABLE CHEST 1 VIEW COMPARISON:  04/09/2023 FINDINGS: Post sternotomy changes. No acute airspace disease or effusion. Stable cardiomediastinal silhouette with aortic atherosclerosis. No pneumothorax IMPRESSION: No active disease. Electronically Signed   By: Jasmine Pang M.D.   On: 04/27/2023 18:22   CT ABDOMEN PELVIS WO CONTRAST Result Date: 04/27/2023 CLINICAL DATA:  Abdominal pain EXAM: CT ABDOMEN AND PELVIS WITHOUT CONTRAST TECHNIQUE: Multidetector CT imaging of the abdomen and pelvis was  performed following the standard protocol without IV contrast. RADIATION DOSE REDUCTION: This exam was performed according to the departmental dose-optimization program which includes automated exposure control, adjustment of the mA and/or kV according to patient size and/or use of iterative reconstruction technique. COMPARISON:  CT 02/04/2023 FINDINGS: Lower chest: Lung bases are clear. Coronary vascular calcifications. Small hiatal hernia Hepatobiliary: No focal liver abnormality is seen. No gallstones, gallbladder wall thickening, or biliary dilatation. Pancreas: Coarse pancreatic calcifications suggestive of chronic pancreatitis. No acute inflammation Spleen: Normal in size without focal abnormality. Adrenals/Urinary Tract: History of right adrenalectomy. Left adrenal gland demonstrates mild diffuse thickening but no mass. No hydronephrosis. The bladder is unremarkable. Stomach/Bowel: Stomach is within normal limits. Appendix appears normal. No  evidence of bowel wall thickening, distention, or inflammatory changes. Vascular/Lymphatic: Aortic atherosclerosis. No enlarged abdominal or pelvic lymph nodes. Reproductive: Hysterectomy.  No adnexal mass Other: Negative for pelvic effusion or free air. Lobulated soft tissue density at the low midline anterior pelvis is chronic and may relate to chronic scarring. Musculoskeletal: No acute osseous abnormality IMPRESSION: 1. No CT evidence for acute intra-abdominal or pelvic abnormality. 2. Small hiatal hernia. 3. Findings suggestive of chronic pancreatitis. No acute inflammatory changes about the pancreas 4. Aortic atherosclerosis. Electronically Signed   By: Jasmine Pang M.D.   On: 04/27/2023 17:17   DG Wrist Complete Left Result Date: 04/27/2023 CLINICAL DATA:  Wrist pain EXAM: LEFT WRIST - COMPLETE 3+ VIEW COMPARISON:  12/20/2021 FINDINGS: Bony demineralization. 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 The  orientation of the scaphoid contributes to bony superimposition and makes it difficult to fully assess. No definite fracture identified. IMPRESSION: 1. 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 2. Bony demineralization. Electronically Signed   By: Gaylyn Rong M.D.   On: 04/27/2023 16:59   DG Chest Portable 1 View Result Date: 04/09/2023 CLINICAL DATA:  Weakness EXAM: PORTABLE CHEST 1 VIEW COMPARISON:  02/04/2023 FINDINGS: Post sternotomy changes. No acute airspace disease, pleural effusion or pneumothorax. Borderline to mild cardiomegaly. No pneumothorax IMPRESSION: No active disease.  Borderline cardiomegaly Electronically Signed   By: Jasmine Pang M.D.   On: 04/09/2023 18:45   (Echo, Carotid, EGD, Colonoscopy, ERCP)    Subjective: Patient seen and examined.  Mild epigastric pain persist.  She tells me this is her chronic pain.  She is eager to go home.  She tells me she had been drinking enough water and keeping up some food.   Discharge Exam: Vitals:   05/01/23 0240 05/01/23 0830  BP: (!) 151/95 (!) 146/98  Pulse: 88 93  Resp: 20 (!) 22  Temp: 98.7 F (37.1 C) 98.9 F (37.2 C)  SpO2: 96% 97%   Vitals:   04/30/23 2216 04/30/23 2245 05/01/23 0240 05/01/23 0830  BP: (!) 160/96 (!) 149/85 (!) 151/95 (!) 146/98  Pulse: 97 96 88 93  Resp:  20 20 (!) 22  Temp:  99.8 F (37.7 C) 98.7 F (37.1 C) 98.9 F (37.2 C)  TempSrc:  Oral Oral Oral  SpO2:  95% 96% 97%  Weight:      Height:        General: Pt is alert, awake, not in acute distress Chronically sick looking.  Frail and debilitated.  Older than his stated age.  On room air. Cardiovascular: RRR, S1/S2 +, no rubs, no gallops Respiratory: CTA bilaterally, no wheezing, no rhonchi Abdominal: Soft, NT, ND, bowel sounds +, obese and pendulous. Extremities:  Left hemiaplasia.  Contracted left upper arm with some deformity at the wrist.  Nontender.    The results of  significant diagnostics from this hospitalization (including imaging, microbiology, ancillary and laboratory) are listed below for reference.     Microbiology: Recent Results (from the past 240 hours)  Culture, blood (Routine X 2) w Reflex to ID Panel     Status: None (Preliminary result)   Collection Time: 04/27/23  8:04 PM   Specimen: BLOOD  Result Value Ref Range Status   Specimen Description BLOOD SITE NOT SPECIFIED  Final   Special Requests   Final    BOTTLES DRAWN AEROBIC AND ANAEROBIC Blood Culture results may not be optimal due to an inadequate volume of  blood received in culture bottles   Culture   Final    NO GROWTH 3 DAYS Performed at [facility], [address] [place], [place] [address]    Report Status PENDING  Incomplete  Culture, blood (Routine X 2) w Reflex to ID Panel     Status: None (Preliminary result)   Collection Time: 04/27/23 11:04 PM   Specimen: BLOOD  Result Value Ref Range Status   Specimen Description BLOOD BLOOD RIGHT HAND  Final   Special Requests   Final    BOTTLES DRAWN AEROBIC ONLY Blood Culture results may not be optimal due to an inadequate volume of blood received in culture bottles   Culture   Final    NO GROWTH 3 DAYS Performed at [facility], [address] [place], [place] [address]    Report Status PENDING  Incomplete     Labs: BNP (last 3 results) Recent Labs    05/22/22 0229 09/06/22 2242  BNP 49.6 19.5   Basic Metabolic Panel: Recent Labs  Lab 04/27/23 2200 04/28/23 0102 04/28/23 0223 04/29/23 0519 04/30/23 0520  NA 139 140 141 144 138  K 3.7 4.0 3.7 3.8 3.4*  CL 106 109 107 110 105  CO2 16* 17* 20* 25 25  GLUCOSE 226* 127* 134* 103* 100*  BUN 40* 36* 33* 25* 11  CREATININE 3.52* 3.03* 2.93* 1.46* 0.84  CALCIUM 9.3 9.2 8.8* 9.1 8.5*  MG  --   --   --  1.7  --   PHOS  --   --   --  3.5  --    Liver Function Tests: Recent Labs  Lab 04/25/23 1430 04/27/23 1542 04/29/23 0519  AST 13 22 14*  ALT  18 19 13   ALKPHOS  --  83 58  BILITOT 0.4 0.7 0.6  PROT 8.0 8.3* 6.3*  ALBUMIN  --  4.1 3.0*   Recent Labs  Lab 04/27/23 1542  LIPASE 27   No results for input(s): "AMMONIA" in the last 168 hours. CBC: Recent Labs  Lab 04/25/23 1430 04/27/23 1542 04/29/23 0519 04/30/23 0520  WBC 8.3 10.7* 12.6* 6.7  NEUTROABS 5,437  --   --   --   HGB 14.4 13.4 10.8* 9.5*  HCT 46.3* 41.0 33.0* 28.4*  MCV 84.3 80.1 80.9 78.9*  PLT 281 343 232 217   Cardiac Enzymes: No results for input(s): "CKTOTAL", "CKMB", "CKMBINDEX", "TROPONINI" in the last 168 hours. BNP: Invalid input(s): "POCBNP" CBG: Recent Labs  Lab 04/30/23 0822 04/30/23 1111 04/30/23 1615 04/30/23 2110 05/01/23 0824  GLUCAP 101* 125* 130* 167* 139*   D-Dimer No results for input(s): "DDIMER" in the last 72 hours. Hgb A1c No results for input(s): "HGBA1C" in the last 72 hours. Lipid Profile No results for input(s): "CHOL", "HDL", "LDLCALC", "TRIG", "CHOLHDL", "LDLDIRECT" in the last 72 hours. Thyroid function studies Recent Labs    04/29/23 0519  TSH 0.475   Anemia work up No results for input(s): "VITAMINB12", "FOLATE", "FERRITIN", "TIBC", "IRON", "RETICCTPCT" in the last 72 hours. Urinalysis    Component Value Date/Time   COLORURINE YELLOW 04/27/2023 2005   APPEARANCEUR CLOUDY (A) 04/27/2023 2005   LABSPEC >1.030 (H) 04/27/2023 2005   PHURINE 5.5 04/27/2023 2005   GLUCOSEU 100 (A) 04/27/2023 2005   HGBUR NEGATIVE 04/27/2023 2005   BILIRUBINUR SMALL (A) 04/27/2023 2005   KETONESUR NEGATIVE 04/27/2023 2005   PROTEINUR NEGATIVE 04/27/2023 2005   NITRITE NEGATIVE 04/27/2023 2005   LEUKOCYTESUR TRACE (A)  04/27/2023 2005   Sepsis Labs Recent Labs  Lab 04/25/23 1430 04/27/23 1542 04/29/23 0519 04/30/23 0520  WBC 8.3 10.7* 12.6* 6.7   Microbiology Recent Results (from the past 240 hours)  Culture, blood (Routine X 2) w Reflex to ID Panel     Status: None (Preliminary result)   Collection Time:  04/27/23  8:04 PM   Specimen: BLOOD  Result Value Ref Range Status   Specimen Description BLOOD SITE NOT SPECIFIED  Final   Special Requests   Final    BOTTLES DRAWN AEROBIC AND ANAEROBIC Blood Culture results may not be optimal due to an inadequate volume of blood received in culture bottles   Culture   Final    NO GROWTH 3 DAYS Performed at [facility], [address] [place], [place] [address]    Report Status PENDING  Incomplete  Culture, blood (Routine X 2) w Reflex to ID Panel     Status: None (Preliminary result)   Collection Time: 04/27/23 11:04 PM   Specimen: BLOOD  Result Value Ref Range Status   Specimen Description BLOOD BLOOD RIGHT HAND  Final   Special Requests   Final    BOTTLES DRAWN AEROBIC ONLY Blood Culture results may not be optimal due to an inadequate volume of blood received in culture bottles   Culture   Final    NO GROWTH 3 DAYS Performed at [facility], [address] [place], [place] [address]    Report Status PENDING  Incomplete     Time coordinating discharge: 35 minutes  SIGNED:   Dorcas Carrow, MD  [facility] 05/01/2023, 10:02 AM

## 2023-05-02 ENCOUNTER — Telehealth: Payer: Self-pay

## 2023-05-02 LAB — CULTURE, BLOOD (ROUTINE X 2)
Culture: NO GROWTH
Culture: NO GROWTH

## 2023-05-02 NOTE — Transitions of Care (Post Inpatient/ED Visit) (Signed)
   05/02/2023  Name: Lori [name] MRN: [id number] DOB: [birth year]  Today's TOC FU Call Status: Today's TOC FU Call Status:: Successful TOC FU Call Completed (Spoke with patient who states husband is primary caregiver and helps with medications but is not currently at home. Unable to complete the initial assessement in full. Patient scheduled for call 05/05/23 at 11am when husband is home) [facility] FU Call Complete Date: 05/02/23 Patient's Name and Date of Birth confirmed.  Transition Care Management Follow-up Telephone Call Date of Discharge: 05/01/23 Discharge Facility: [facility]) Type of Discharge: Inpatient Admission Primary Inpatient Discharge Diagnosis:: Acute Renal Failure How have you been since you were released from the hospital?: Better Any questions or concerns?: No (Patient states, "I just don't want to go on dialysis")  Items Reviewed: Any new allergies since your discharge?: No Dietary orders reviewed?: Yes Type of Diet Ordered:: low sodium heart healthy Do you have support at home?: Yes People in Home: spouse Name of Support/Comfort Primary Source: husband - aide comes 7 days a week 3 hours a day - has applied for CAPS and more hours   Home Care and Equipment/Supplies: Were Home Health Services Ordered?: Yes Name of Home Health Agency:: [facility]  Functional Questionnaire: Do you need assistance with bathing/showering or dressing?: Yes (patient has caregiver 7 days a week) Do you need assistance with meal preparation?: Yes (patient states husband and paid caregiver prepare meals) Do you need assistance with eating?: Yes (patient states needs someone to cut bite size portions) Do you have difficulty maintaining continence: Yes Do you need assistance with getting out of bed/getting out of a chair/moving?: Yes (husband and caregiver helps) Do you have difficulty managing or taking your medications?: Yes (patient states husband manages medications and  is not currently at home)  Follow up appointments reviewed: Do you need transportation to your follow-up appointment?: No (husband drives to appoointments) Do you understand care options if your condition(s) worsen?: Yes-patient verbalized understanding   Hilbert Odor RN, CCM [facility]  VBCI-Population Health RN Care Manager [phone]

## 2023-05-05 ENCOUNTER — Telehealth: Payer: Self-pay

## 2023-05-05 NOTE — Transitions of Care (Post Inpatient/ED Visit) (Signed)
   05/05/2023  Name: Lori [name] MRN: [id number] DOB: 1970/08/26  Today's TOC FU Call Status: Today's TOC FU Call Status:: Successful TOC FU Call Completed (Husband could not find discharge papers and husband states he does not speak [place] well - Conference call with [facility] and patient - visit scheduled tomorrow, 4/1 with request to thoroughly review meds - patient declined further calls) Patient's Name and Date of Birth confirmed.   Items Reviewed:PCP follow up    Medications Reviewed Today: Unable to review medications - Home health will see patient tomorrow and review  Interventions Today    Flowsheet Row Most Recent Value  Chronic Disease   Chronic disease during today's visit Diabetes, Congestive Heart Failure (CHF), Other  [Debility]  General Interventions   General Interventions Discussed/Reviewed General Interventions Discussed, Doctor Visits  Doctor Visits Discussed/Reviewed Doctor Visits Discussed       TOC Interventions Today    Flowsheet Row Most Recent Value  TOC Interventions   TOC Interventions Discussed/Reviewed Contacted Home Health RN/OT/PT, TOC Interventions Discussed         TOC placed call as scheduled to speak with husband per patient request to review medications. Husband was unable to find discharge papers and states he does not speak [place] well. Conference call completed with [facility] and patient. TOC RN requested when nurse visits home 05/06/23, to review medications to ensure patient taking correctly - Patient then denied need for further calls since when will have nurse in the home  Hilbert Odor RN, CCM [facility]  VBCI-Population Health RN Care Manager [phone]

## 2023-05-06 ENCOUNTER — Other Ambulatory Visit: Payer: Self-pay | Admitting: Family

## 2023-05-06 ENCOUNTER — Telehealth: Payer: Self-pay

## 2023-05-06 DIAGNOSIS — Z794 Long term (current) use of insulin: Secondary | ICD-10-CM | POA: Diagnosis not present

## 2023-05-06 DIAGNOSIS — G4733 Obstructive sleep apnea (adult) (pediatric): Secondary | ICD-10-CM | POA: Diagnosis not present

## 2023-05-06 DIAGNOSIS — K922 Gastrointestinal hemorrhage, unspecified: Secondary | ICD-10-CM | POA: Diagnosis not present

## 2023-05-06 DIAGNOSIS — Z7901 Long term (current) use of anticoagulants: Secondary | ICD-10-CM | POA: Diagnosis not present

## 2023-05-06 DIAGNOSIS — I110 Hypertensive heart disease with heart failure: Secondary | ICD-10-CM | POA: Diagnosis not present

## 2023-05-06 DIAGNOSIS — Z8616 Personal history of COVID-19: Secondary | ICD-10-CM | POA: Diagnosis not present

## 2023-05-06 DIAGNOSIS — R000 Tachycardia, unspecified: Secondary | ICD-10-CM | POA: Diagnosis not present

## 2023-05-06 DIAGNOSIS — I509 Heart failure, unspecified: Secondary | ICD-10-CM | POA: Diagnosis not present

## 2023-05-06 DIAGNOSIS — Z556 Problems related to health literacy: Secondary | ICD-10-CM | POA: Diagnosis not present

## 2023-05-06 DIAGNOSIS — E785 Hyperlipidemia, unspecified: Secondary | ICD-10-CM

## 2023-05-06 DIAGNOSIS — E1169 Type 2 diabetes mellitus with other specified complication: Secondary | ICD-10-CM | POA: Diagnosis not present

## 2023-05-06 DIAGNOSIS — N179 Acute kidney failure, unspecified: Secondary | ICD-10-CM | POA: Diagnosis not present

## 2023-05-06 DIAGNOSIS — Z7902 Long term (current) use of antithrombotics/antiplatelets: Secondary | ICD-10-CM | POA: Diagnosis not present

## 2023-05-06 DIAGNOSIS — E0590 Thyrotoxicosis, unspecified without thyrotoxic crisis or storm: Secondary | ICD-10-CM | POA: Diagnosis not present

## 2023-05-06 DIAGNOSIS — R040 Epistaxis: Secondary | ICD-10-CM | POA: Diagnosis not present

## 2023-05-06 DIAGNOSIS — J4520 Mild intermittent asthma, uncomplicated: Secondary | ICD-10-CM | POA: Diagnosis not present

## 2023-05-06 DIAGNOSIS — K219 Gastro-esophageal reflux disease without esophagitis: Secondary | ICD-10-CM | POA: Diagnosis not present

## 2023-05-06 DIAGNOSIS — J4489 Other specified chronic obstructive pulmonary disease: Secondary | ICD-10-CM | POA: Diagnosis not present

## 2023-05-06 DIAGNOSIS — I2510 Atherosclerotic heart disease of native coronary artery without angina pectoris: Secondary | ICD-10-CM | POA: Diagnosis not present

## 2023-05-06 DIAGNOSIS — K5901 Slow transit constipation: Secondary | ICD-10-CM | POA: Diagnosis not present

## 2023-05-06 NOTE — Telephone Encounter (Unsigned)
 Copied from CRM [id number]. Topic: Clinical - Medication Refill >> May 06, 2023  1:40 PM Maree Krabbe H wrote: Most Recent Primary Care Visit:  Provider: Richarda Blade C  Department: [facility]  Visit Type: OFFICE VISIT  Date: 04/25/2023  Medication: tamsulosin (FLOMAX) 0.4 MG CAPS capsule, insulin glargine (LANTUS SOLOSTAR) 100 UNIT/ML Solostar Pen, insulin lispro (HUMALOG) 100 UNIT/ML injection, cyclobenzaprine (FLEXERIL) 5 MG tablet  Has the patient contacted their pharmacy? Yes (Agent: If no, request that the patient contact the pharmacy for the refill. If patient does not wish to contact the pharmacy document the reason why and proceed with request.) (Agent: If yes, when and what did the pharmacy advise?)  Is this the correct pharmacy for this prescription? Yes If no, delete pharmacy and type the correct one.  This is the patient's preferred pharmacy:   [facility] - [place], [place] - [address] [place] [address] Phone: [phone] Fax: [phone]  [facility] - [place], [place] - [address] [place] [address] Phone: [phone] Fax: [phone]   Has the prescription been filled recently? Yes  Is the patient out of the medication? Yes  Has the patient been seen for an appointment in the last year OR does the patient have an upcoming appointment? Yes  Can we respond through MyChart? Yes  Agent: Please be advised that Rx refills may take up to 3 business days. We ask that you follow-up with your pharmacy.

## 2023-05-06 NOTE — Telephone Encounter (Signed)
 We approved rx for [facility] 2 months ago and now we are receiving a request from [facility].   Left message on voicemail for patient to return call when available to verify which pharmacy she is using to fill atorvastatin. Awaiting return call

## 2023-05-06 NOTE — Telephone Encounter (Signed)
 Copied from CRM [id number]. Topic: Clinical - Home Health Verbal Orders >> May 06, 2023 12:43 PM Brittney F wrote: Caller/Agency: Lori [name]  [facility]   Callback Number: [phone]  Service Requested:   Skilled Nursing  Frequency: Nursing 1 time a week for 5 weeks After that there will be a reassessment to see the needs of the patient  Any new concerns about the patient? Yes  Patient seems to be very confused about what prescriptions she is on. Skilled nurse is unaware if the patient is taking all of the required medications. Please give her a call to inform her of what medications the patient is actively taking.  Verbal orders were given to Mrs. Lori [name] Odom from [facility]. Ngetich, Donalee Citrin, NP has been notify

## 2023-05-06 NOTE — Telephone Encounter (Signed)
 Patient requesting Refills.  We DO NOT have in patient's current Medication list that she is taking the Humalog Pen 4 units with meals, we only have sliding scale but patient is stating that she takes it with meals.  E2C2 Pended Rx's for approval. Forwarded to [employer].

## 2023-05-06 NOTE — Telephone Encounter (Signed)
 Copied from CRM [id number]. Topic: Clinical - Home Health Verbal Orders >> May 06, 2023 12:49 PM Brittney F wrote: Caller/Agency: [facility]  Callback Number: [phone]  Service Requested: Physical Therapy  Frequency: For physical therapy evaluation  Any new concerns about the patient? No  Verbal orders were given to Mrs. Lori [name] from [facility]. Ngetich, Donalee Citrin, NP has been notify

## 2023-05-06 NOTE — Telephone Encounter (Signed)
 Last Fill: Tamsulosin: Unknown       Flexeril: 11/18/22 90 tabs/10 RF       Humalog: 12/06/22       Lantus: 05/01/23         Last OV: 04/25/23 Next OV: 10/27/23  Routing to provider for review/authorization.     Copied from CRM [id number]. Topic: Clinical - Medication Refill >> May 06, 2023  1:40 PM Maree Krabbe H wrote: Most Recent Primary Care Visit:  Provider: Richarda Blade C  Department: [facility]  Visit Type: OFFICE VISIT  Date: 04/25/2023  Medication: tamsulosin (FLOMAX) 0.4 MG CAPS capsule, insulin glargine (LANTUS SOLOSTAR) 100 UNIT/ML Solostar Pen, insulin lispro (HUMALOG) 100 UNIT/ML injection, cyclobenzaprine (FLEXERIL) 5 MG tablet  Has the patient contacted their pharmacy? Yes (Agent: If no, request that the patient contact the pharmacy for the refill. If patient does not wish to contact the pharmacy document the reason why and proceed with request.) (Agent: If yes, when and what did the pharmacy advise?)  Is this the correct pharmacy for this prescription? Yes If no, delete pharmacy and type the correct one.  This is the patient's preferred pharmacy:   [facility] - [place], [place] - [address] [place] [address] Phone: [phone] Fax: [phone]  [facility] - [place], [place] - [address] [place] [address] Phone: [phone] Fax: [phone]   Has the prescription been filled recently? Yes  Is the patient out of the medication? Yes  Has the patient been seen for an appointment in the last year OR does the patient have an upcoming appointment? Yes  Can we respond through MyChart? Yes  Agent: Please be advised that Rx refills may take up to 3 business days. We ask that you follow-up with your pharmacy.

## 2023-05-06 NOTE — Telephone Encounter (Signed)
 Copied from CRM [id number]. Topic: Clinical - Home Health Verbal Orders >> May 06, 2023 12:50 PM Brittney F wrote: Caller/Agency: [facility]  Callback Number: [phone]  Service Requested: Occupational Therapy  Frequency: For a OT assessment   Any new concerns about the patient? No  Verbal orders were given to Lori [name] from [facility]. Ngetich, Donalee Citrin, NP has been notify

## 2023-05-07 MED ORDER — CYCLOBENZAPRINE HCL 5 MG PO TABS: 5.0000 mg | ORAL_TABLET | Freq: Three times a day (TID) | ORAL | 5 refills | Status: DC | PRN

## 2023-05-07 MED ORDER — LANTUS SOLOSTAR 100 UNIT/ML ~~LOC~~ SOPN: 10.0000 [IU] | PEN_INJECTOR | Freq: Every day | SUBCUTANEOUS | 5 refills | Status: DC

## 2023-05-07 MED ORDER — INSULIN LISPRO 100 UNIT/ML IJ SOLN: INTRAMUSCULAR | 5 refills | Status: DC

## 2023-05-07 MED ORDER — TAMSULOSIN HCL 0.4 MG PO CAPS: 0.4000 mg | ORAL_CAPSULE | Freq: Every day | ORAL | 5 refills | Status: DC

## 2023-05-07 NOTE — Telephone Encounter (Signed)
 Noted.

## 2023-05-08 ENCOUNTER — Emergency Department (HOSPITAL_COMMUNITY)
Admission: EM | Admit: 2023-05-08 | Discharge: 2023-05-08 | Disposition: A | Discharge status: Home / Self Care | Source: Home / Self Care | Attending: Emergency Medicine | Admitting: Emergency Medicine

## 2023-05-08 ENCOUNTER — Other Ambulatory Visit: Payer: Self-pay

## 2023-05-08 DIAGNOSIS — R1013 Epigastric pain: Secondary | ICD-10-CM | POA: Diagnosis not present

## 2023-05-08 DIAGNOSIS — E860 Dehydration: Secondary | ICD-10-CM | POA: Diagnosis not present

## 2023-05-08 DIAGNOSIS — R519 Headache, unspecified: Secondary | ICD-10-CM | POA: Insufficient documentation

## 2023-05-08 DIAGNOSIS — E039 Hypothyroidism, unspecified: Secondary | ICD-10-CM | POA: Diagnosis not present

## 2023-05-08 DIAGNOSIS — I509 Heart failure, unspecified: Secondary | ICD-10-CM | POA: Insufficient documentation

## 2023-05-08 DIAGNOSIS — R39198 Other difficulties with micturition: Secondary | ICD-10-CM | POA: Insufficient documentation

## 2023-05-08 DIAGNOSIS — Z7401 Bed confinement status: Secondary | ICD-10-CM | POA: Diagnosis not present

## 2023-05-08 DIAGNOSIS — R110 Nausea: Secondary | ICD-10-CM | POA: Diagnosis not present

## 2023-05-08 DIAGNOSIS — G4489 Other headache syndrome: Secondary | ICD-10-CM | POA: Diagnosis not present

## 2023-05-08 DIAGNOSIS — K5900 Constipation, unspecified: Secondary | ICD-10-CM | POA: Diagnosis not present

## 2023-05-08 DIAGNOSIS — I2510 Atherosclerotic heart disease of native coronary artery without angina pectoris: Secondary | ICD-10-CM | POA: Insufficient documentation

## 2023-05-08 DIAGNOSIS — Z794 Long term (current) use of insulin: Secondary | ICD-10-CM | POA: Diagnosis not present

## 2023-05-08 DIAGNOSIS — Z8249 Family history of ischemic heart disease and other diseases of the circulatory system: Secondary | ICD-10-CM | POA: Diagnosis not present

## 2023-05-08 DIAGNOSIS — R109 Unspecified abdominal pain: Secondary | ICD-10-CM | POA: Diagnosis not present

## 2023-05-08 DIAGNOSIS — E876 Hypokalemia: Secondary | ICD-10-CM | POA: Diagnosis not present

## 2023-05-08 DIAGNOSIS — R1111 Vomiting without nausea: Secondary | ICD-10-CM | POA: Diagnosis not present

## 2023-05-08 DIAGNOSIS — Z7901 Long term (current) use of anticoagulants: Secondary | ICD-10-CM | POA: Diagnosis not present

## 2023-05-08 DIAGNOSIS — Z743 Need for continuous supervision: Secondary | ICD-10-CM | POA: Diagnosis not present

## 2023-05-08 DIAGNOSIS — K3184 Gastroparesis: Secondary | ICD-10-CM | POA: Diagnosis not present

## 2023-05-08 DIAGNOSIS — E871 Hypo-osmolality and hyponatremia: Secondary | ICD-10-CM | POA: Diagnosis not present

## 2023-05-08 DIAGNOSIS — E1169 Type 2 diabetes mellitus with other specified complication: Secondary | ICD-10-CM | POA: Diagnosis not present

## 2023-05-08 DIAGNOSIS — Z86718 Personal history of other venous thrombosis and embolism: Secondary | ICD-10-CM | POA: Diagnosis not present

## 2023-05-08 DIAGNOSIS — R112 Nausea with vomiting, unspecified: Secondary | ICD-10-CM | POA: Diagnosis not present

## 2023-05-08 DIAGNOSIS — E0590 Thyrotoxicosis, unspecified without thyrotoxic crisis or storm: Secondary | ICD-10-CM | POA: Diagnosis not present

## 2023-05-08 DIAGNOSIS — D509 Iron deficiency anemia, unspecified: Secondary | ICD-10-CM | POA: Diagnosis not present

## 2023-05-08 DIAGNOSIS — I5032 Chronic diastolic (congestive) heart failure: Secondary | ICD-10-CM | POA: Diagnosis not present

## 2023-05-08 DIAGNOSIS — Z955 Presence of coronary angioplasty implant and graft: Secondary | ICD-10-CM | POA: Insufficient documentation

## 2023-05-08 DIAGNOSIS — R739 Hyperglycemia, unspecified: Secondary | ICD-10-CM | POA: Diagnosis not present

## 2023-05-08 DIAGNOSIS — G4733 Obstructive sleep apnea (adult) (pediatric): Secondary | ICD-10-CM | POA: Diagnosis not present

## 2023-05-08 DIAGNOSIS — E1110 Type 2 diabetes mellitus with ketoacidosis without coma: Secondary | ICD-10-CM | POA: Diagnosis not present

## 2023-05-08 DIAGNOSIS — E785 Hyperlipidemia, unspecified: Secondary | ICD-10-CM | POA: Diagnosis not present

## 2023-05-08 DIAGNOSIS — K219 Gastro-esophageal reflux disease without esophagitis: Secondary | ICD-10-CM | POA: Diagnosis not present

## 2023-05-08 DIAGNOSIS — K861 Other chronic pancreatitis: Secondary | ICD-10-CM | POA: Diagnosis not present

## 2023-05-08 DIAGNOSIS — N179 Acute kidney failure, unspecified: Secondary | ICD-10-CM | POA: Diagnosis not present

## 2023-05-08 DIAGNOSIS — I69320 Aphasia following cerebral infarction: Secondary | ICD-10-CM | POA: Diagnosis not present

## 2023-05-08 DIAGNOSIS — E1143 Type 2 diabetes mellitus with diabetic autonomic (poly)neuropathy: Secondary | ICD-10-CM | POA: Diagnosis not present

## 2023-05-08 DIAGNOSIS — I110 Hypertensive heart disease with heart failure: Secondary | ICD-10-CM | POA: Diagnosis not present

## 2023-05-08 DIAGNOSIS — R000 Tachycardia, unspecified: Secondary | ICD-10-CM | POA: Diagnosis not present

## 2023-05-08 DIAGNOSIS — I69354 Hemiplegia and hemiparesis following cerebral infarction affecting left non-dominant side: Secondary | ICD-10-CM | POA: Diagnosis not present

## 2023-05-08 LAB — CBC
HCT: 39.6 % (ref 36.0–46.0)
Hemoglobin: 13.2 g/dL (ref 12.0–15.0)
MCH: 26.2 pg (ref 26.0–34.0)
MCHC: 33.3 g/dL (ref 30.0–36.0)
MCV: 78.6 fL — ABNORMAL LOW (ref 80.0–100.0)
Platelets: 473 10*3/uL — ABNORMAL HIGH (ref 150–400)
RBC: 5.04 MIL/uL (ref 3.87–5.11)
RDW: 16.1 % — ABNORMAL HIGH (ref 11.5–15.5)
WBC: 14.9 10*3/uL — ABNORMAL HIGH (ref 4.0–10.5)
nRBC: 0 % (ref 0.0–0.2)

## 2023-05-08 LAB — COMPREHENSIVE METABOLIC PANEL WITH GFR
ALT: 18 U/L (ref 0–44)
AST: 16 U/L (ref 15–41)
Albumin: 3.7 g/dL (ref 3.5–5.0)
Alkaline Phosphatase: 76 U/L (ref 38–126)
Anion gap: 13 (ref 5–15)
BUN: 17 mg/dL (ref 6–20)
CO2: 24 mmol/L (ref 22–32)
Calcium: 9.7 mg/dL (ref 8.9–10.3)
Chloride: 98 mmol/L (ref 98–111)
Creatinine, Ser: 0.81 mg/dL (ref 0.44–1.00)
GFR, Estimated: >60 mL/min (ref >60)
Glucose, Bld: 319 mg/dL — ABNORMAL HIGH (ref 70–99)
Potassium: 3.7 mmol/L (ref 3.5–5.1)
Sodium: 135 mmol/L (ref 135–145)
Total Bilirubin: 0.5 mg/dL (ref 0.0–1.2)
Total Protein: 7.9 g/dL (ref 6.5–8.1)

## 2023-05-08 LAB — CBG MONITORING, ED: Glucose-Capillary: 310 mg/dL — ABNORMAL HIGH (ref 70–99)

## 2023-05-08 LAB — LIPASE, BLOOD: Lipase: 28 U/L (ref 11–51)

## 2023-05-08 MED ORDER — SODIUM CHLORIDE 0.9 % IV BOLUS
500.0000 mL | Freq: Once | INTRAVENOUS | Status: AC
  Administered 2023-05-08: 500 mL via INTRAVENOUS

## 2023-05-08 MED ORDER — DIPHENHYDRAMINE HCL 50 MG/ML IJ SOLN
12.5000 mg | Freq: Once | INTRAMUSCULAR | Status: AC
  Administered 2023-05-08: 12.5 mg via INTRAVENOUS
  Filled 2023-05-08: qty 1

## 2023-05-08 MED ORDER — ALUM & MAG HYDROXIDE-SIMETH 200-200-20 MG/5ML PO SUSP
30.0000 mL | Freq: Once | ORAL | Status: AC
  Administered 2023-05-08: 30 mL via ORAL
  Filled 2023-05-08: qty 30

## 2023-05-08 MED ORDER — FAMOTIDINE IN NACL 20-0.9 MG/50ML-% IV SOLN
20.0000 mg | Freq: Once | INTRAVENOUS | Status: AC
  Administered 2023-05-08: 20 mg via INTRAVENOUS
  Filled 2023-05-08: qty 50

## 2023-05-08 MED ORDER — INSULIN ASPART 100 UNIT/ML IJ SOLN
5.0000 [IU] | Freq: Once | INTRAMUSCULAR | Status: AC
  Administered 2023-05-08: 5 [IU] via SUBCUTANEOUS
  Filled 2023-05-08: qty 0.05

## 2023-05-08 MED ORDER — METOCLOPRAMIDE HCL 5 MG/ML IJ SOLN
10.0000 mg | Freq: Once | INTRAMUSCULAR | Status: AC
  Administered 2023-05-08: 10 mg via INTRAVENOUS
  Filled 2023-05-08: qty 2

## 2023-05-08 MED ORDER — ONDANSETRON HCL 4 MG/2ML IJ SOLN
4.0000 mg | Freq: Once | INTRAMUSCULAR | Status: AC | PRN
  Administered 2023-05-08: 4 mg via INTRAVENOUS
  Filled 2023-05-08: qty 2

## 2023-05-08 NOTE — ED Triage Notes (Addendum)
 Pt presents to the ED from home with complaints of vomiting and abdominal pain since last night. Pt also states that she has been unable to urinate since yesterday. Aox4. Left sided deficits from previous stroke.   EMS vitals: BP 114/70, HR 124, BGL 277

## 2023-05-08 NOTE — ED Notes (Addendum)
 [facility] called to have patient transported home and called patient husband to assure he's home at her arrival.

## 2023-05-08 NOTE — Discharge Instructions (Addendum)
 You were seen today for nausea and vomiting.  This is likely due to gastroparesis however with you leading, recommend that you continue to monitor symptoms at home.  Recommend that you follow-up with your GI doctor as well as PCP urgently within the next couple days and monitor for any new or worsening symptoms including fever, shortness of breath, chest pain, abdominal pain, persistent nausea and vomiting despite Reglan and Zofran,  lower leg swelling.   Continue with nausea medication prescribed over a including Reglan as per GI.

## 2023-05-08 NOTE — Telephone Encounter (Signed)
 Another outgoing call was placed to patient, no answer, and voicemail left asking that patient call if she would like atorvastatin filled at another pharmacy

## 2023-05-08 NOTE — ED Provider Notes (Signed)
  EMERGENCY DEPARTMENT AT [facility] Provider Note   CSN: [id number] Arrival date & time: 05/08/23  1231     History  Chief Complaint  Patient presents with   Emesis   Abdominal Pain    Debroah Loop [name] is a 53 y.o. female.   Emesis Associated symptoms: abdominal pain   Abdominal Pain Associated symptoms: vomiting   Patient is a 53 year old female who comes ED today with complaints of nausea, vomiting and epigastric pain x 1 day.  Reports last vomiting episode being 2 hours ago.  Also stating that she has had difficulty urinating.  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  Noted to have increased to discharge, and had seen GI on 04/11/2023 where she was noted to have been put on Protonix and Reglan, taking Reglan before every meal for the gastroparesis.  Also reports having heartburn.  Endorses mild headache Denies fever, cough, congestion, shortness of breath, diarrhea, melena, hematochezia, lower leg swelling.     Home Medications Prior to Admission medications   Medication Sig Start Date End Date Taking? Authorizing Provider  acetaminophen (TYLENOL) 325 MG tablet Take 2 tablets (650 mg total) by mouth every 6 (six) hours as needed for mild pain (pain score 1-3) (or Fever >/= 101). 02/12/23   Rodolph Bong, MD  albuterol (PROVENTIL) (2.5 MG/3ML) 0.083% nebulizer solution Take 3 mLs (2.5 mg total) by nebulization every 6 (six) hours as needed for wheezing or shortness of breath. 01/01/23   Ngetich, Dinah C, NP  albuterol (VENTOLIN HFA) 108 (90 Base) MCG/ACT inhaler INHALE 2 PUFFS BY MOUTH EVERY 6 HOURS AS NEEDED FOR WHEEZING AND FOR SHORTNESS OF BREATH Strength: 108 (90 Base) MCG/ACT 01/01/23   Ngetich, Dinah C, NP  atorvastatin (LIPITOR) 80 MG tablet TAKE 1 TABLET (80 MG TOTAL) BY MOUTH DAILY. 03/31/23   Ngetich, Dinah C, NP  baclofen (LIORESAL) 10 MG  tablet Take 1 tablet (10 mg total) by mouth 3 (three) times daily. 01/08/23   Ngetich, Dinah C, NP  clopidogrel (PLAVIX) 75 MG tablet TAKE 1 TABLET BY MOUTH DAILY 04/09/23   Ngetich, Donalee Citrin, NP  Continuous Glucose Receiver (FREESTYLE LIBRE 2 READER) DEVI 1 Device by Does not apply route daily. E11.69 11/01/22   Ngetich, Dinah C, NP  Continuous Glucose Sensor (FREESTYLE LIBRE 14 DAY SENSOR) MISC 1 each by Does not apply route as needed. DX: E11.69 12/24/22   Ngetich, Dinah C, NP  cyclobenzaprine (FLEXERIL) 5 MG tablet Take 1 tablet (5 mg total) by mouth 3 (three) times daily as needed for muscle spasms. 05/07/23   Ngetich, Dinah C, NP  diclofenac Sodium (VOLTAREN) 1 % GEL Apply 1 Application topically 4 (four) times daily as needed (pain).    [provider]  escitalopram (LEXAPRO) 20 MG tablet Take 1 tablet (20 mg total) by mouth every morning. 01/01/23   Ngetich, Dinah C, NP  ezetimibe (ZETIA) 10 MG tablet TAKE 1 TABLET BY MOUTH DAILY 04/09/23   Ngetich, Dinah C, NP  fluticasone (FLONASE) 50 MCG/ACT nasal spray Place 2 sprays into both nostrils daily for 7 days. Patient not taking: Reported on 04/30/2023 02/12/23 04/30/23  Rodolph Bong, MD  gabapentin (NEURONTIN) 100 MG capsule Take 100 mg by mouth 3 (three) times daily. 11/14/22   [provider]  HUMALOG KWIKPEN 100 UNIT/ML KwikPen INJECT 4 UNITS SUBCUTANEOUSLY THREE TIMES A DAY WITH MEALS 05/07/23   Ngetich, [employer]  C, NP  insulin glargine (LANTUS SOLOSTAR) 100 UNIT/ML Solostar Pen Inject 10 Units into the skin at bedtime. 05/07/23   Ngetich, Dinah C, NP  insulin lispro (HUMALOG) 100 UNIT/ML injection Sliding scale AC, HS CBG 121 - 150: 2 units CBG 151 - 200: 3 units CBG 201 - 250: 5 units CBG 251 - 300: 8 units CBG 301 - 350: 11 units CBG 351 - 400: 15 units CBG > 400: call MD 05/07/23   Ngetich, Dinah C, NP  LORazepam (ATIVAN) 0.5 MG tablet Take 1 tablet (0.5 mg total) by mouth at bedtime. Patient not taking: Reported on 04/30/2023 02/12/23    Rodolph Bong, MD  methimazole (TAPAZOLE) 5 MG tablet TAKE 1 TABLET BY MOUTH EVERY  MORNING 04/09/23   Ngetich, Dinah C, NP  metoprolol tartrate (LOPRESSOR) 100 MG tablet Take 1 tablet (100 mg total) by mouth 2 (two) times daily. Hold for SBP < 110 02/12/23   Rodolph Bong, MD  olmesartan-hydrochlorothiazide (BENICAR HCT) 40-25 MG tablet Take 1 tablet by mouth daily. Patient not taking: Reported on 04/30/2023    [provider]  pantoprazole (PROTONIX) 40 MG tablet Take 1 tablet (40 mg total) by mouth 2 (two) times daily. 04/11/23   Armbruster, Willaim Rayas, MD  potassium chloride SA (KLOR-CON M) 20 MEQ tablet Take 1 tablet (20 mEq total) by mouth 2 (two) times daily for 2 days. 05/01/23 05/03/23  Dorcas Carrow, MD  Prucalopride Succinate (MOTEGRITY) 2 MG TABS Take 1 tablet (2 mg total) by mouth daily. LOT: [id number], EXP: 11-2023 Patient not taking: Reported on 04/30/2023 04/11/23   Armbruster, Willaim Rayas, MD  ranolazine (RANEXA) 500 MG 12 hr tablet Take 1 tablet (500 mg total) by mouth 2 (two) times daily. 10/09/22 01/23/24  [place], Alvira Philips, DO  rivaroxaban (XARELTO) 10 MG TABS tablet TAKE 1 TABLET BY MOUTH DAILY 04/09/23   Ngetich, Dinah C, NP  senna-docusate (SENOKOT-S) 8.6-50 MG tablet Take 1 tablet by mouth 2 (two) times daily. 02/12/23   Rodolph Bong, MD  sorbitol 70 % SOLN Take 30 mLs by mouth daily as needed for moderate constipation. 02/12/23   Rodolph Bong, MD  sucralfate (CARAFATE) 1 GM/10ML suspension Take 10 mLs (1 g total) by mouth at bedtime. Patient not taking: Reported on 04/30/2023 04/11/23   Benancio Deeds, MD  tamsulosin (FLOMAX) 0.4 MG CAPS capsule Take 1 capsule (0.4 mg total) by mouth at bedtime. 05/07/23   Ngetich, Dinah C, NP  topiramate (TOPAMAX) 25 MG tablet Take 1 tablet (25 mg total) by mouth 2 (two) times daily. 07/10/22   Sherryll Burger, Pratik D, DO      Allergies    Asa [aspirin], Contrast media [iodinated contrast media], Imitrex [sumatriptan], Ms contin [morphine],  Penicillins, Firvanq [vancomycin], Cipro [ciprofloxacin hcl], Nitrofuran derivatives, and Wound dressing adhesive    Review of Systems   Review of Systems  Gastrointestinal:  Positive for abdominal pain and vomiting.  All other systems reviewed and are negative.   Physical Exam Updated Vital Signs BP (!) 147/114   Pulse (!) 103   Temp 99.8 F (37.7 C) (Oral)   Resp (!) 24   SpO2 98%  Physical Exam Vitals and nursing note reviewed.  Constitutional:      General: She is not in acute distress.    Appearance: Normal appearance. She is not ill-appearing.  HENT:     Head: Normocephalic and atraumatic.  Eyes:     Extraocular Movements: Extraocular movements intact.  Conjunctiva/sclera: Conjunctivae normal.  Cardiovascular:     Rate and Rhythm: Regular rhythm. Tachycardia present.     Pulses: Normal pulses.     Heart sounds: Normal heart sounds. No murmur heard.    No friction rub. No gallop.  Pulmonary:     Effort: Pulmonary effort is normal. No respiratory distress.     Breath sounds: Normal breath sounds. No stridor. No wheezing, rhonchi or rales.  Abdominal:     General: Abdomen is flat.     Palpations: Abdomen is soft.     Tenderness: There is abdominal tenderness (Mild epigastric abdominal tenderness noted to palpation) in the epigastric area. There is no right CVA tenderness, left CVA tenderness, guarding or rebound. Negative signs include Murphy's sign, Rovsing's sign, McBurney's sign and obturator sign.  Skin:    General: Skin is warm and dry.     Coloration: Skin is not jaundiced, mottled or pale.     Findings: No erythema or rash.  Neurological:     Mental Status: She is alert. Mental status is at baseline.     Comments: At baseline  Psychiatric:        Mood and Affect: Mood normal.     ED Results / Procedures / Treatments   Labs (all labs ordered are listed, but only abnormal results are displayed) Labs Reviewed  COMPREHENSIVE METABOLIC PANEL WITH GFR -  Abnormal; Notable for the following components:      Result Value   Glucose, Bld 319 (*)    All other components within normal limits  CBC - Abnormal; Notable for the following components:   WBC 14.9 (*)    MCV 78.6 (*)    RDW 16.1 (*)    Platelets 473 (*)    All other components within normal limits  CBG MONITORING, ED - Abnormal; Notable for the following components:   Glucose-Capillary 310 (*)    All other components within normal limits  LIPASE, BLOOD  URINALYSIS, ROUTINE W REFLEX MICROSCOPIC    EKG None  Radiology No results found.  Procedures Procedures    Medications Ordered in ED Medications  ondansetron (ZOFRAN) injection 4 mg (4 mg Intravenous Given 05/08/23 1425)  sodium chloride 0.9 % bolus 500 mL (0 mLs Intravenous Stopped 05/08/23 1648)  insulin aspart (novoLOG) injection 5 Units (5 Units Subcutaneous Given 05/08/23 1547)  famotidine (PEPCID) IVPB 20 mg premix (0 mg Intravenous Stopped 05/08/23 1616)  alum & mag hydroxide-simeth (MAALOX/MYLANTA) 200-200-20 MG/5ML suspension 30 mL (30 mLs Oral Given 05/08/23 1542)  metoCLOPramide (REGLAN) injection 10 mg (10 mg Intravenous Given 05/08/23 1547)  diphenhydrAMINE (BENADRYL) injection 12.5 mg (12.5 mg Intravenous Given 05/08/23 1628)    ED Course/ Medical Decision Making/ A&P                                Medical Decision Making  Patient is a 53 year old female who comes ED today with complaints of nausea, vomiting and epigastric pain x 1 day.  Reports last vomiting episode being 2 hours ago.  Also stating that she has had difficulty urinating. Also reports having heartburn.  On physical exam, patient is noted to be mildly tachycardic with BPM of low 100s, nontachypneic, no acute distress, afebrile, speaking in full sentences.  Noted to have previous deficits from CVA including aphasia.  Noted to have mild epigastric tenderness to palpation.  LCTAB, no lower leg edema noted.  Unremarkable exam otherwise.  POC glucose was  noted to be 310, and sliding scale insulin was noted to be 7 units.  Provided 5 units of NovoLog as well as some fluids for likely dehydration from the nausea and vomiting.  On reexamination, patient was noted to have BPM of mid to high 90s.  Resting comfortably, nontachypneic.  Upon speaking with nurse, nurse stated that she had called few times because patient said that she wished to go home at this time and is feeling much better.  I was not consulted for discharge before EMS arrived.  However on reevaluation and discussion with patient the risks of , Patient states that she is feeling "much better and ready go home."  Dr. Denton Lank also saw patient patient and thought she could  go home with strict return to ED precautions provided.  I believe the patient is safe for discharge at this time as her symptoms have greatly improved with medications and patient is actively ready to leave.  Discussed strict return to ED precautions.  Patient expressed agreement and understanding of plan.  All questions answered.    Differential diagnoses prior to evaluation: The emergent differential diagnosis includes, but is not limited to, gastroparesis, PUD, GUD, pancreatitis, UTI, nephrolithiasis, ACS, PE, heart failure exacerbation. This is not an exhaustive differential.   Past Medical History / Co-morbidities / Social History: Hypothyroidism, HTN, pheochromocytoma, left sided deficits post CVA, CHF, type 2 diabetes on insulin, HLD, functional urinary incontinence, DVT on Xarelto, gastroparesis, CAD status post CABG on Plavix  Additional history: Chart reviewed. Pertinent results include:  Discharged on 05/01/2023 for severe nausea and vomiting due to DKA and AKI.  Noted to have been put on a sliding scale long-acting insulin 10 units daily down from 14 units.  Renal functions improved with fluids. Noted to be mostly bedbound and wheelchair-bound at home with PT and OT already consulted and recommended to go to a  SNF.  Noted to be put back on methimazole for hypothyroidism  Last echo done on 10/07/2022 noted to have a EF of 60 to 65% with mild concentric LVH  Last CT abdomen and pelvis was done on 04/27/2023 noted to have no acute intra-abdominal or pelvic abnormalities.  Possible suggestive of chronic pancreatitis, small hiatal hernia.  Lab Tests/Imaging studies: I personally interpreted labs/imaging and the pertinent results include:   CBC notes an elevated white count of 14.9 CMP shows a close of 219 Lipase unremarkable  Included CT imaging however CT imaging done on 3/23 and showed no acute abnormalities and patient is no longer having abdominal tenderness to palpation after Maalox provided.  Cardiac monitoring: EKG obtained and interpreted by myself and attending physician which shows: Sinus tachycardia   Medications: I ordered medication including Maalox, insulin, Benadryl, Reglan.  I have reviewed the patients home medicines and have made adjustments as needed.  Disposition: After consideration of the diagnostic results and the patients response to treatment, I feel that the patient would benefit from discharge and treatment as above.   emergency department workup does not suggest an emergent condition requiring admission or immediate intervention beyond what has been performed at this time. The plan is: Close symptom monitoring, follow-up with PCP, follow-up with GI, return for any new or worsening symptoms, continue with current nausea medications prescribed by GI. The patient is safe for discharge and has been instructed to return immediately for worsening symptoms, change in symptoms or any other concerns.  Final Clinical Impression(s) / ED Diagnoses Final diagnoses:  Nausea and vomiting, unspecified vomiting type  Rx / DC Orders ED Discharge Orders     None         Lavonia Drafts 05/08/23 1742    Cathren Laine, MD 05/09/23 2011

## 2023-05-09 ENCOUNTER — Ambulatory Visit: Payer: Self-pay | Admitting: Family

## 2023-05-09 ENCOUNTER — Other Ambulatory Visit: Payer: Self-pay

## 2023-05-09 ENCOUNTER — Inpatient Hospital Stay (HOSPITAL_COMMUNITY)
Admission: EM | Admit: 2023-05-09 | Discharge: 2023-05-13 | DRG: 074 | Disposition: A | Discharge status: Home Health | Attending: Internal Medicine | Admitting: Internal Medicine

## 2023-05-09 ENCOUNTER — Encounter (HOSPITAL_COMMUNITY): Payer: Self-pay | Admitting: Emergency Medicine

## 2023-05-09 DIAGNOSIS — Z832 Family history of diseases of the blood and blood-forming organs and certain disorders involving the immune mechanism: Secondary | ICD-10-CM

## 2023-05-09 DIAGNOSIS — D509 Iron deficiency anemia, unspecified: Secondary | ICD-10-CM | POA: Diagnosis present

## 2023-05-09 DIAGNOSIS — Z825 Family history of asthma and other chronic lower respiratory diseases: Secondary | ICD-10-CM

## 2023-05-09 DIAGNOSIS — E785 Hyperlipidemia, unspecified: Secondary | ICD-10-CM | POA: Diagnosis present

## 2023-05-09 DIAGNOSIS — E871 Hypo-osmolality and hyponatremia: Secondary | ICD-10-CM | POA: Diagnosis present

## 2023-05-09 DIAGNOSIS — E1169 Type 2 diabetes mellitus with other specified complication: Secondary | ICD-10-CM | POA: Diagnosis present

## 2023-05-09 DIAGNOSIS — Z808 Family history of malignant neoplasm of other organs or systems: Secondary | ICD-10-CM

## 2023-05-09 DIAGNOSIS — Z993 Dependence on wheelchair: Secondary | ICD-10-CM

## 2023-05-09 DIAGNOSIS — Z8249 Family history of ischemic heart disease and other diseases of the circulatory system: Secondary | ICD-10-CM

## 2023-05-09 DIAGNOSIS — Z79899 Other long term (current) drug therapy: Secondary | ICD-10-CM

## 2023-05-09 DIAGNOSIS — R000 Tachycardia, unspecified: Secondary | ICD-10-CM | POA: Diagnosis present

## 2023-05-09 DIAGNOSIS — I110 Hypertensive heart disease with heart failure: Secondary | ICD-10-CM | POA: Diagnosis present

## 2023-05-09 DIAGNOSIS — Z7401 Bed confinement status: Secondary | ICD-10-CM

## 2023-05-09 DIAGNOSIS — I69320 Aphasia following cerebral infarction: Secondary | ICD-10-CM

## 2023-05-09 DIAGNOSIS — I69354 Hemiplegia and hemiparesis following cerebral infarction affecting left non-dominant side: Secondary | ICD-10-CM

## 2023-05-09 DIAGNOSIS — K861 Other chronic pancreatitis: Secondary | ICD-10-CM | POA: Diagnosis present

## 2023-05-09 DIAGNOSIS — F32A Depression, unspecified: Secondary | ICD-10-CM | POA: Diagnosis present

## 2023-05-09 DIAGNOSIS — E876 Hypokalemia: Secondary | ICD-10-CM | POA: Diagnosis present

## 2023-05-09 DIAGNOSIS — K3184 Gastroparesis: Secondary | ICD-10-CM | POA: Diagnosis present

## 2023-05-09 DIAGNOSIS — I10 Essential (primary) hypertension: Secondary | ICD-10-CM | POA: Diagnosis present

## 2023-05-09 DIAGNOSIS — D72829 Elevated white blood cell count, unspecified: Secondary | ICD-10-CM | POA: Diagnosis present

## 2023-05-09 DIAGNOSIS — Z86718 Personal history of other venous thrombosis and embolism: Secondary | ICD-10-CM

## 2023-05-09 DIAGNOSIS — D75839 Thrombocytosis, unspecified: Secondary | ICD-10-CM | POA: Diagnosis present

## 2023-05-09 DIAGNOSIS — Z951 Presence of aortocoronary bypass graft: Secondary | ICD-10-CM

## 2023-05-09 DIAGNOSIS — Z833 Family history of diabetes mellitus: Secondary | ICD-10-CM

## 2023-05-09 DIAGNOSIS — Z8679 Personal history of other diseases of the circulatory system: Secondary | ICD-10-CM

## 2023-05-09 DIAGNOSIS — E1143 Type 2 diabetes mellitus with diabetic autonomic (poly)neuropathy: Secondary | ICD-10-CM | POA: Diagnosis not present

## 2023-05-09 DIAGNOSIS — G4733 Obstructive sleep apnea (adult) (pediatric): Secondary | ICD-10-CM

## 2023-05-09 DIAGNOSIS — E860 Dehydration: Secondary | ICD-10-CM | POA: Diagnosis present

## 2023-05-09 DIAGNOSIS — K5900 Constipation, unspecified: Secondary | ICD-10-CM | POA: Diagnosis present

## 2023-05-09 DIAGNOSIS — Z7901 Long term (current) use of anticoagulants: Secondary | ICD-10-CM

## 2023-05-09 DIAGNOSIS — R110 Nausea: Principal | ICD-10-CM

## 2023-05-09 DIAGNOSIS — E1110 Type 2 diabetes mellitus with ketoacidosis without coma: Secondary | ICD-10-CM | POA: Diagnosis present

## 2023-05-09 DIAGNOSIS — R471 Dysarthria and anarthria: Secondary | ICD-10-CM | POA: Diagnosis present

## 2023-05-09 DIAGNOSIS — K219 Gastro-esophageal reflux disease without esophagitis: Secondary | ICD-10-CM | POA: Diagnosis present

## 2023-05-09 DIAGNOSIS — I2510 Atherosclerotic heart disease of native coronary artery without angina pectoris: Secondary | ICD-10-CM | POA: Diagnosis present

## 2023-05-09 DIAGNOSIS — Z7902 Long term (current) use of antithrombotics/antiplatelets: Secondary | ICD-10-CM

## 2023-05-09 DIAGNOSIS — Z794 Long term (current) use of insulin: Secondary | ICD-10-CM

## 2023-05-09 DIAGNOSIS — Z90710 Acquired absence of both cervix and uterus: Secondary | ICD-10-CM

## 2023-05-09 DIAGNOSIS — Z86711 Personal history of pulmonary embolism: Secondary | ICD-10-CM

## 2023-05-09 DIAGNOSIS — E039 Hypothyroidism, unspecified: Secondary | ICD-10-CM | POA: Diagnosis present

## 2023-05-09 DIAGNOSIS — R5381 Other malaise: Secondary | ICD-10-CM | POA: Diagnosis present

## 2023-05-09 DIAGNOSIS — R1310 Dysphagia, unspecified: Secondary | ICD-10-CM | POA: Diagnosis present

## 2023-05-09 DIAGNOSIS — K30 Functional dyspepsia: Secondary | ICD-10-CM | POA: Diagnosis present

## 2023-05-09 DIAGNOSIS — I5032 Chronic diastolic (congestive) heart failure: Secondary | ICD-10-CM | POA: Diagnosis present

## 2023-05-09 DIAGNOSIS — R112 Nausea with vomiting, unspecified: Secondary | ICD-10-CM

## 2023-05-09 DIAGNOSIS — N179 Acute kidney failure, unspecified: Secondary | ICD-10-CM | POA: Diagnosis present

## 2023-05-09 DIAGNOSIS — F411 Generalized anxiety disorder: Secondary | ICD-10-CM | POA: Diagnosis present

## 2023-05-09 DIAGNOSIS — Z8673 Personal history of transient ischemic attack (TIA), and cerebral infarction without residual deficits: Secondary | ICD-10-CM

## 2023-05-09 DIAGNOSIS — E0590 Thyrotoxicosis, unspecified without thyrotoxic crisis or storm: Secondary | ICD-10-CM | POA: Diagnosis present

## 2023-05-09 LAB — CBC WITH DIFFERENTIAL/PLATELET
Abs Immature Granulocytes: 0.09 10*3/uL — ABNORMAL HIGH (ref 0.00–0.07)
Basophils Absolute: 0.1 10*3/uL (ref 0.0–0.1)
Basophils Relative: 0 %
Eosinophils Absolute: 0.1 10*3/uL (ref 0.0–0.5)
Eosinophils Relative: 1 %
HCT: 41.5 % (ref 36.0–46.0)
Hemoglobin: 13.4 g/dL (ref 12.0–15.0)
Immature Granulocytes: 0 %
Lymphocytes Relative: 15 %
Lymphs Abs: 3 10*3/uL (ref 0.7–4.0)
MCH: 26.2 pg (ref 26.0–34.0)
MCHC: 32.3 g/dL (ref 30.0–36.0)
MCV: 81.2 fL (ref 80.0–100.0)
Monocytes Absolute: 1 10*3/uL (ref 0.1–1.0)
Monocytes Relative: 5 %
Neutro Abs: 15.8 10*3/uL — ABNORMAL HIGH (ref 1.7–7.7)
Neutrophils Relative %: 79 %
Platelets: 494 10*3/uL — ABNORMAL HIGH (ref 150–400)
RBC: 5.11 MIL/uL (ref 3.87–5.11)
RDW: 16.2 % — ABNORMAL HIGH (ref 11.5–15.5)
WBC: 20.1 10*3/uL — ABNORMAL HIGH (ref 4.0–10.5)
nRBC: 0 % (ref 0.0–0.2)

## 2023-05-09 LAB — LIPASE, BLOOD: Lipase: 23 U/L (ref 11–51)

## 2023-05-09 LAB — COMPREHENSIVE METABOLIC PANEL WITH GFR
ALT: 20 U/L (ref 0–44)
AST: 27 U/L (ref 15–41)
Albumin: 3.8 g/dL (ref 3.5–5.0)
Alkaline Phosphatase: 83 U/L (ref 38–126)
Anion gap: 13 (ref 5–15)
BUN: 19 mg/dL (ref 6–20)
CO2: 23 mmol/L (ref 22–32)
Calcium: 9.5 mg/dL (ref 8.9–10.3)
Chloride: 97 mmol/L — ABNORMAL LOW (ref 98–111)
Creatinine, Ser: 1.04 mg/dL — ABNORMAL HIGH (ref 0.44–1.00)
GFR, Estimated: >60 mL/min (ref >60)
Glucose, Bld: 335 mg/dL — ABNORMAL HIGH (ref 70–99)
Potassium: 4.8 mmol/L (ref 3.5–5.1)
Sodium: 133 mmol/L — ABNORMAL LOW (ref 135–145)
Total Bilirubin: 1.2 mg/dL (ref 0.0–1.2)
Total Protein: 8.2 g/dL — ABNORMAL HIGH (ref 6.5–8.1)

## 2023-05-09 LAB — GLUCOSE, CAPILLARY: Glucose-Capillary: 388 mg/dL — ABNORMAL HIGH (ref 70–99)

## 2023-05-09 MED ORDER — METOCLOPRAMIDE HCL 5 MG/ML IJ SOLN
5.0000 mg | Freq: Four times a day (QID) | INTRAMUSCULAR | Status: DC
  Administered 2023-05-10 – 2023-05-11 (×6): 5 mg via INTRAVENOUS
  Filled 2023-05-09 (×8): qty 2

## 2023-05-09 MED ORDER — ENOXAPARIN SODIUM 40 MG/0.4ML IJ SOSY
40.0000 mg | PREFILLED_SYRINGE | INTRAMUSCULAR | Status: DC
  Filled 2023-05-09: qty 0.4

## 2023-05-09 MED ORDER — LORAZEPAM 2 MG/ML IJ SOLN
1.0000 mg | Freq: Once | INTRAMUSCULAR | Status: AC
  Administered 2023-05-10: 1 mg via INTRAVENOUS
  Filled 2023-05-09: qty 1

## 2023-05-09 MED ORDER — INSULIN ASPART 100 UNIT/ML IJ SOLN
0.0000 [IU] | Freq: Every day | INTRAMUSCULAR | Status: DC
  Administered 2023-05-09: 5 [IU] via SUBCUTANEOUS

## 2023-05-09 MED ORDER — METOCLOPRAMIDE HCL 5 MG/ML IJ SOLN
10.0000 mg | Freq: Once | INTRAMUSCULAR | Status: AC
  Administered 2023-05-09: 10 mg via INTRAVENOUS
  Filled 2023-05-09: qty 2

## 2023-05-09 MED ORDER — DIPHENHYDRAMINE HCL 50 MG/ML IJ SOLN
50.0000 mg | Freq: Once | INTRAMUSCULAR | Status: AC
  Administered 2023-05-09: 50 mg via INTRAVENOUS
  Filled 2023-05-09: qty 1

## 2023-05-09 MED ORDER — ONDANSETRON HCL 4 MG/2ML IJ SOLN
4.0000 mg | Freq: Once | INTRAMUSCULAR | Status: AC
  Administered 2023-05-09: 4 mg via INTRAVENOUS
  Filled 2023-05-09: qty 2

## 2023-05-09 MED ORDER — INSULIN ASPART 100 UNIT/ML IJ SOLN: 0.0000 [IU] | Freq: Three times a day (TID) | INTRAMUSCULAR | Status: DC

## 2023-05-09 MED ORDER — SODIUM CHLORIDE 0.9 % IV SOLN
Freq: Once | INTRAVENOUS | Status: AC
  Administered 2023-05-09: 125 mL/h via INTRAVENOUS

## 2023-05-09 MED ORDER — ONDANSETRON HCL 4 MG PO TABS
4.0000 mg | ORAL_TABLET | Freq: Four times a day (QID) | ORAL | Status: DC | PRN
Start: 2023-05-09 — End: 2023-05-13

## 2023-05-09 MED ORDER — LACTATED RINGERS IV SOLN
INTRAVENOUS | Status: DC
Start: 2023-05-09 — End: 2023-05-10

## 2023-05-09 MED ORDER — ONDANSETRON HCL 4 MG/2ML IJ SOLN
4.0000 mg | Freq: Four times a day (QID) | INTRAMUSCULAR | Status: DC | PRN
  Administered 2023-05-10 – 2023-05-11 (×5): 4 mg via INTRAVENOUS
  Filled 2023-05-09 (×5): qty 2

## 2023-05-09 NOTE — Telephone Encounter (Signed)
 Chief Complaint: Vomiting x2 days  Symptoms: Nausea, not urinating every 12 hours Pertinent Negatives: Patient denies abdomen pain, diarrhea  Disposition: [x] ED   Additional Notes: Pt was in ED yesterday for nausea and vomiting. This RN called pt an ambulance to take pt back to ED as pt states she has severe vomiting, can't keep any food or liquid down, and has not urinated in past 12 hours. Pt is agreeable to plan.    Copied from CRM [id number]. Topic: Clinical - Red Word Triage >> May 09, 2023  5:29 PM Adrianna P wrote: Red Word that prompted transfer to Nurse Triage: patient has been having a painful headache on and off for 2 days and has been having nausea for 2 days as well. Patient hasn't even been able to keep down water. She got out of hospital yesterday. Reason for Disposition  [1] Drinking very little AND [2] dehydration suspected (e.g., no urine > 12 hours, very dry mouth, very lightheaded)  Answer Assessment - Initial Assessment Questions 1. VOMITING SEVERITY: "How many times have you vomited in the past 24 hours?"     - MILD:  1 - 2 times/day    - MODERATE: 3 - 5 times/day, decreased oral intake without significant weight loss or symptoms of dehydration    - SEVERE: 6 or more times/day, vomits everything or nearly everything, with significant weight loss, symptoms of dehydration      Severe 2. ONSET: "When did the vomiting begin?"      2 days 3. FLUIDS: "What fluids or food have you vomited up today?" "Have you been able to keep any fluids down?"     Throwing up after drinking 4. ABDOMEN PAIN: "Are your having any abdomen pain?" If Yes : "How bad is it and what does it feel like?" (e.g., crampy, dull, intermittent, constant)      Denies 5. DIARRHEA: "Is there any diarrhea?" If Yes, ask: "How many times today?"      Denies 7. CAUSE: "What do you think is causing your vomiting?"     Not sure  Protocols used: Vomiting-A-AH

## 2023-05-09 NOTE — ED Notes (Signed)
 Pt. Requested pure-wick so she can urinate per RN.

## 2023-05-09 NOTE — ED Provider Notes (Signed)
 [place] [facility]ERGENC[facility] DEPARTMENT AT [facility] Provider Note   CSN: [id number] Arrival date & time: 05/09/23  1840     History  Chief Complaint  Patient presents with   Emesis   Nausea    Lori [name] is a 53 y.o. female.  53 year old with history of type 2 diabetes and gastroparesis, CAD status post CABG, chronic diastolic heart failure, history of stroke with residual left-sided weakness, left upper extremity contracture, DVT on Xarelto, pheochromocytoma status post right adrenalectomy, hypothyroidism, sleep apnea on CPAP presents with continued nausea and vomiting.  Patient was seen for same yesterday.  She reports that her symptoms improved briefly while in the ED.  Subsequently, she reports that at home over the last 24 hours she has been unable to take p.o. secondary to persistent nausea and vomiting.  The history is provided by the patient.       Home Medications Prior to Admission medications   Medication Sig Start Date End Date Taking? Authorizing Provider  acetaminophen (TYLENOL) 325 MG tablet Take 2 tablets (650 mg total) by mouth every 6 (six) hours as needed for mild pain (pain score 1-3) (or Fever >/= 101). 02/12/23   Rodolph Bong, MD  albuterol (PROVENTIL) (2.5 MG/3ML) 0.083% nebulizer solution Take 3 mLs (2.5 mg total) by nebulization every 6 (six) hours as needed for wheezing or shortness of breath. 01/01/23   Ngetich, Dinah C, NP  albuterol (VENTOLIN HFA) 108 (90 Base) MCG/ACT inhaler INHALE 2 PUFFS BY MOUTH EVERY 6 HOURS AS NEEDED FOR WHEEZING AND FOR SHORTNESS OF BREATH Strength: 108 (90 Base) MCG/ACT 01/01/23   Ngetich, Dinah C, NP  atorvastatin (LIPITOR) 80 MG tablet TAKE 1 TABLET (80 MG TOTAL) BY MOUTH DAILY. 03/31/23   Ngetich, Dinah C, NP  baclofen (LIORESAL) 10 MG tablet Take 1 tablet (10 mg total) by mouth 3 (three) times daily. 01/08/23   Ngetich, Dinah C, NP  clopidogrel (PLAVIX) 75 MG tablet TAKE 1 TABLET BY MOUTH  DAILY 04/09/23   Ngetich, Donalee Citrin, NP  Continuous Glucose Receiver (FREESTYLE LIBRE 2 READER) DEVI 1 Device by Does not apply route daily. E11.69 11/01/22   Ngetich, Dinah C, NP  Continuous Glucose Sensor (FREESTYLE LIBRE 14 DAY SENSOR) MISC 1 each by Does not apply route as needed. DX: E11.69 12/24/22   Ngetich, Dinah C, NP  cyclobenzaprine (FLEXERIL) 5 MG tablet Take 1 tablet (5 mg total) by mouth 3 (three) times daily as needed for muscle spasms. 05/07/23   Ngetich, Dinah C, NP  diclofenac Sodium (VOLTAREN) 1 % GEL Apply 1 Application topically 4 (four) times daily as needed (pain).    [provider]  escitalopram (LEXAPRO) 20 MG tablet Take 1 tablet (20 mg total) by mouth every morning. 01/01/23   Ngetich, Dinah C, NP  ezetimibe (ZETIA) 10 MG tablet TAKE 1 TABLET BY MOUTH DAILY 04/09/23   Ngetich, Dinah C, NP  fluticasone (FLONASE) 50 MCG/ACT nasal spray Place 2 sprays into both nostrils daily for 7 days. Patient not taking: Reported on 04/30/2023 02/12/23 04/30/23  Rodolph Bong, MD  gabapentin (NEURONTIN) 100 MG capsule Take 100 mg by mouth 3 (three) times daily. 11/14/22   [provider]  HUMALOG KWIKPEN 100 UNIT/ML KwikPen INJECT 4 UNITS SUBCUTANEOUSLY THREE TIMES A DAY WITH MEALS 05/07/23   Ngetich, Dinah C, NP  insulin glargine (LANTUS SOLOSTAR) 100 UNIT/ML Solostar Pen Inject 10 Units into the skin at bedtime. 05/07/23   Ngetich, Donalee Citrin, NP  insulin  lispro (HUMALOG) 100 UNIT/ML injection Sliding scale AC, HS CBG 121 - 150: 2 units CBG 151 - 200: 3 units CBG 201 - 250: 5 units CBG 251 - 300: 8 units CBG 301 - 350: 11 units CBG 351 - 400: 15 units CBG > 400: call MD 05/07/23   Ngetich, Dinah C, NP  LORazepam (ATIVAN) 0.5 MG tablet Take 1 tablet (0.5 mg total) by mouth at bedtime. Patient not taking: Reported on 04/30/2023 02/12/23   Rodolph Bong, MD  methimazole (TAPAZOLE) 5 MG tablet TAKE 1 TABLET BY MOUTH EVERY  MORNING 04/09/23   Ngetich, Dinah C, NP  metoprolol tartrate  (LOPRESSOR) 100 MG tablet Take 1 tablet (100 mg total) by mouth 2 (two) times daily. Hold for SBP < 110 02/12/23   Rodolph Bong, MD  olmesartan-hydrochlorothiazide (BENICAR HCT) 40-25 MG tablet Take 1 tablet by mouth daily. Patient not taking: Reported on 04/30/2023    [provider]  pantoprazole (PROTONIX) 40 MG tablet Take 1 tablet (40 mg total) by mouth 2 (two) times daily. 04/11/23   Armbruster, Willaim Rayas, MD  potassium chloride SA (KLOR-CON M) 20 MEQ tablet Take 1 tablet (20 mEq total) by mouth 2 (two) times daily for 2 days. 05/01/23 05/03/23  Dorcas Carrow, MD  Prucalopride Succinate (MOTEGRITY) 2 MG TABS Take 1 tablet (2 mg total) by mouth daily. LOT: [id number], EXP: 11-2023 Patient not taking: Reported on 04/30/2023 04/11/23   Armbruster, Willaim Rayas, MD  ranolazine (RANEXA) 500 MG 12 hr tablet Take 1 tablet (500 mg total) by mouth 2 (two) times daily. 10/09/22 01/23/24  [place], Alvira Philips, DO  rivaroxaban (XARELTO) 10 MG TABS tablet TAKE 1 TABLET BY MOUTH DAILY 04/09/23   Ngetich, Dinah C, NP  senna-docusate (SENOKOT-S) 8.6-50 MG tablet Take 1 tablet by mouth 2 (two) times daily. 02/12/23   Rodolph Bong, MD  sorbitol 70 % SOLN Take 30 mLs by mouth daily as needed for moderate constipation. 02/12/23   Rodolph Bong, MD  sucralfate (CARAFATE) 1 GM/10ML suspension Take 10 mLs (1 g total) by mouth at bedtime. Patient not taking: Reported on 04/30/2023 04/11/23   Benancio Deeds, MD  tamsulosin (FLOMAX) 0.4 MG CAPS capsule Take 1 capsule (0.4 mg total) by mouth at bedtime. 05/07/23   Ngetich, Dinah C, NP  topiramate (TOPAMAX) 25 MG tablet Take 1 tablet (25 mg total) by mouth 2 (two) times daily. 07/10/22   Sherryll Burger, Pratik D, DO      Allergies    Asa [aspirin], Contrast media [iodinated contrast media], Imitrex [sumatriptan], Ms contin [morphine], Penicillins, Firvanq [vancomycin], Cipro [ciprofloxacin hcl], Nitrofuran derivatives, and Wound dressing adhesive    Review of Systems   Review of  Systems  All other systems reviewed and are negative.   Physical Exam Updated Vital Signs BP (!) 141/91   Pulse 77   Temp 98.9 F (37.2 C) (Oral)   Resp 17   Ht 5\' 2"  (1.575 m)   Wt 83.9 kg   SpO2 99%   BMI 33.84 kg/m  Physical Exam Vitals and nursing note reviewed.  Constitutional:      General: She is not in acute distress.    Appearance: Normal appearance. She is well-developed.  HENT:     Head: Normocephalic and atraumatic.  Eyes:     Conjunctiva/sclera: Conjunctivae normal.     Pupils: Pupils are equal, round, and reactive to light.  Cardiovascular:     Rate and Rhythm: Normal rate and regular rhythm.  Heart sounds: Normal heart sounds.  Pulmonary:     Effort: Pulmonary effort is normal. No respiratory distress.     Breath sounds: Normal breath sounds.  Abdominal:     General: There is no distension.     Palpations: Abdomen is soft.     Tenderness: There is no abdominal tenderness.  Musculoskeletal:        General: No deformity. Normal range of motion.     Cervical back: Normal range of motion and neck supple.  Skin:    General: Skin is warm and dry.  Neurological:     General: No focal deficit present.     Mental Status: She is alert and oriented to person, place, and time.     ED Results / Procedures / Treatments   Labs (all labs ordered are listed, but only abnormal results are displayed) Labs Reviewed  COMPREHENSIVE METABOLIC PANEL WITH GFR - Abnormal; Notable for the following components:      Result Value   Sodium 133 (*)    Chloride 97 (*)    Glucose, Bld 335 (*)    Creatinine, Ser 1.04 (*)    Total Protein 8.2 (*)    All other components within normal limits  CBC WITH DIFFERENTIAL/PLATELET - Abnormal; Notable for the following components:   WBC 20.1 (*)    RDW 16.2 (*)    Platelets 494 (*)    Neutro Abs 15.8 (*)    Abs Immature Granulocytes 0.09 (*)    All other components within normal limits  LIPASE, BLOOD     EKG None  Radiology No results found.  Procedures Procedures    Medications Ordered in ED Medications  ondansetron (ZOFRAN) injection 4 mg (4 mg Intravenous Given 05/09/23 1946)  metoCLOPramide (REGLAN) injection 10 mg (10 mg Intravenous Given 05/09/23 1946)  diphenhydrAMINE (BENADRYL) injection 50 mg (50 mg Intravenous Given 05/09/23 2037)    ED Course/ Medical Decision Making/ A&P                                 Medical Decision Making Amount and/or Complexity of Data Reviewed Labs: ordered.  Risk Prescription drug management. Decision regarding hospitalization.    Medical Screen Complete  This patient presented to the ED with complaint of nausea and vomiting.  This complaint involves an extensive number of treatment options. The initial differential diagnosis includes, but is not limited to, exacerbation of gastroparesis, metabolic abnormality, AKI, etc.  This presentation is: Acute, Chronic, Self-Limited, Previously Undiagnosed, Uncertain Prognosis, Complicated, Systemic Symptoms, and Threat to Life/Bodily Function  Patient with multiple comorbidities presents with persistent nausea and vomiting.  Presentation is perhaps most consistent with exacerbation of gastroparesis.  Patient with presentation yesterday for same complaint.  Her symptoms have not been controlled at home.  She returns asking for evaluation and admission.  Hospitalist service made aware of case will evaluate for admission.  Co morbidities that complicated the patient's evaluation  See HPI   Additional history obtained:  External records from outside sources obtained and reviewed including prior ED visits and prior Inpatient records.  Problem List / ED Course:  Nausea and vomiting  Disposition:  After consideration of the diagnostic results and the patients response to treatment, I feel that the patent would benefit from admission.          Final Clinical Impression(s) / ED  Diagnoses Final diagnoses:  Nausea  Nausea and vomiting, unspecified vomiting type  Rx / DC Orders ED Discharge Orders     None         Wynetta Fines, MD 05/09/23 2222

## 2023-05-09 NOTE — ED Triage Notes (Signed)
 Pt presents to the ED from home with complaints of vomiting and nausea x3 days. Pt was seen in ED yesterday and states she did not receive her prescription for nausea medication. Left sided deficits from previous stroke. At baseline mentation per EMS.   EMS vitals: BP: 146/90 HR 107 R 19 O2 98 BGL 334

## 2023-05-09 NOTE — ED Notes (Signed)
 ED TO INPATIENT HANDOFF REPORT  ED Nurse Name and Phone #: Majel Homer, RN   S Name/Age/Gender Lori [name] 53 y.o. female Room/Bed: WA17/WA17  Code Status   Code Status: Full Code  Home/SNF/Other Home Patient oriented to: self, place, time, and situation Is this baseline? Yes   Triage Complete: Triage complete  Chief Complaint Diabetic gastroparesis (HCC) [E11.43, K31.84]  Triage Note Pt presents to the ED from home with complaints of vomiting and nausea x3 days. Pt was seen in ED yesterday and states she did not receive her prescription for nausea medication. Left sided deficits from previous stroke. At baseline mentation per EMS.   EMS vitals: BP: 146/90 HR 107 R 19 O2 98 BGL 334   Allergies Allergies  Allergen Reactions   Asa [Aspirin] Anaphylaxis, Swelling and Other (See Comments)    Throat closes   Contrast Media [Iodinated Contrast Media] Anaphylaxis and Swelling   Imitrex [Sumatriptan] Anaphylaxis   Ms Contin [Morphine] Anaphylaxis and Swelling   Penicillins Swelling and Other (See Comments)    Mouth and eyes swell   Firvanq [Vancomycin] Other (See Comments)    Red Man's Syndrome   Cipro [Ciprofloxacin Hcl] Nausea And Vomiting   Nitrofuran Derivatives Anxiety   Wound Dressing Adhesive Itching and Rash    Level of Care/Admitting Diagnosis ED Disposition     ED Disposition  Admit   Condition  --   Comment  Hospital Area: [facility] [[id number]]  Level of Care: Med-Surg [16]  May place patient in observation at [facility] or [place] if equivalent level of care is available:: No  Covid Evaluation: Asymptomatic - no recent exposure (last 10 days) testing not required  Diagnosis: Diabetic gastroparesis (HCC) [409811]  Admitting Physician: Rometta Emery [[id number]]  Attending Physician: Rometta Emery [[id number]]          B Medical/Surgery History Past Medical History:  Diagnosis Date   CAD (coronary artery  disease)    Depression    Diabetes mellitus without complication (HCC)    History of CT scan    History of mammogram    History of MRI    Hypertension    Pheochromocytoma    Reversible cerebrovascular vasoconstriction syndrome    Stroke [facility])    Thyroid disease    Past Surgical History:  Procedure Laterality Date   ABDOMINAL HYSTERECTOMY     BIOPSY  02/06/2023   Procedure: BIOPSY;  Surgeon: Benancio Deeds, MD;  Location: [facility] ENDOSCOPY;  Service: Gastroenterology;;   CESAREAN SECTION     3   CORONARY ARTERY BYPASS GRAFT     ESOPHAGOGASTRODUODENOSCOPY (EGD) WITH PROPOFOL N/A 02/06/2023   Procedure: ESOPHAGOGASTRODUODENOSCOPY (EGD) WITH PROPOFOL;  Surgeon: Benancio Deeds, MD;  Location: [facility] ENDOSCOPY;  Service: Gastroenterology;  Laterality: N/A;   Right adrenal gland removal for pheochromocytoma Right      A IV Location/Drains/Wounds Patient Lines/Drains/Airways Status     Active Line/Drains/Airways     Name Placement date Placement time Site Days   Peripheral IV 05/09/23 22 G 1" Right Antecubital 05/09/23  1945  Antecubital  less than 1            Intake/Output Last 24 hours No intake or output data in the 24 hours ending 05/09/23 2229  Labs/Imaging Results for orders placed or performed during the hospital encounter of 05/09/23 (from the past 48 hours)  Comprehensive metabolic panel     Status: Abnormal   Collection Time: 05/09/23  7:48 PM  Result Value Ref Range   Sodium 133 (L) 135 - 145 mmol/L   Potassium 4.8 3.5 - 5.1 mmol/L    Comment: HEMOLYSIS AT THIS LEVEL MAY AFFECT RESULT   Chloride 97 (L) 98 - 111 mmol/L   CO2 23 22 - 32 mmol/L   Glucose, Bld 335 (H) 70 - 99 mg/dL    Comment: Glucose reference range applies only to samples taken after fasting for at least 8 hours.   BUN 19 6 - 20 mg/dL   Creatinine, Ser 9.52 (H) 0.44 - 1.00 mg/dL   Calcium 9.5 8.9 - 84.1 mg/dL   Total Protein 8.2 (H) 6.5 - 8.1 g/dL   Albumin 3.8 3.5 - 5.0 g/dL   AST 27 15 -  41 U/L    Comment: HEMOLYSIS AT THIS LEVEL MAY AFFECT RESULT   ALT 20 0 - 44 U/L    Comment: HEMOLYSIS AT THIS LEVEL MAY AFFECT RESULT   Alkaline Phosphatase 83 38 - 126 U/L   Total Bilirubin 1.2 0.0 - 1.2 mg/dL    Comment: HEMOLYSIS AT THIS LEVEL MAY AFFECT RESULT   GFR, Estimated >60 >60 mL/min    Comment: (NOTE) Calculated using the CKD-EPI Creatinine Equation (2021)    Anion gap 13 5 - 15    Comment: Performed at [facility], [address], [place], [place] [address]  CBC with Differential     Status: Abnormal   Collection Time: 05/09/23  7:48 PM  Result Value Ref Range   WBC 20.1 (H) 4.0 - 10.5 K/uL   RBC 5.11 3.87 - 5.11 MIL/uL   Hemoglobin 13.4 12.0 - 15.0 g/dL   HCT 10.2 72.5 - 36.6 %   MCV 81.2 80.0 - 100.0 fL   MCH 26.2 26.0 - 34.0 pg   MCHC 32.3 30.0 - 36.0 g/dL   RDW 44.0 (H) 34.7 - 42.5 %   Platelets 494 (H) 150 - 400 K/uL   nRBC 0.0 0.0 - 0.2 %   Neutrophils Relative % 79 %   Neutro Abs 15.8 (H) 1.7 - 7.7 K/uL   Lymphocytes Relative 15 %   Lymphs Abs 3.0 0.7 - 4.0 K/uL   Monocytes Relative 5 %   Monocytes Absolute 1.0 0.1 - 1.0 K/uL   Eosinophils Relative 1 %   Eosinophils Absolute 0.1 0.0 - 0.5 K/uL   Basophils Relative 0 %   Basophils Absolute 0.1 0.0 - 0.1 K/uL   Immature Granulocytes 0 %   Abs Immature Granulocytes 0.09 (H) 0.00 - 0.07 K/uL    Comment: Performed at [facility], [address], [place], [place] [address]  Lipase, blood     Status: None   Collection Time: 05/09/23  7:48 PM  Result Value Ref Range   Lipase 23 11 - 51 U/L    Comment: Performed at [facility], [address], [place], [place] [address]   No results found.  Pending Labs Unresulted Labs (From admission, onward)     Start     Ordered   05/09/23 2154  Urinalysis, w/ Reflex to Culture (Infection Suspected) -Urine, Clean Catch  Once,   URGENT       Question:  Specimen Source  Answer:  Urine, Clean Catch   05/09/23  2153   Signed and Held  HIV Antibody (routine testing w rflx)  (HIV Antibody (Routine testing w reflex) panel)  Once,   R        Signed and Held  Signed and Held  CBC  (enoxaparin (LOVENOX)    CrCl >/= 30 ml/min)  Once,   R       Comments: Baseline for enoxaparin therapy IF NOT ALREADY DRAWN.  Notify MD if PLT < 100 K.    Signed and Held   Signed and Held  Creatinine, serum  (enoxaparin (LOVENOX)    CrCl >/= 30 ml/min)  Once,   R       Comments: Baseline for enoxaparin therapy IF NOT ALREADY DRAWN.    Signed and Held   Signed and Held  Creatinine, serum  (enoxaparin (LOVENOX)    CrCl >/= 30 ml/min)  Weekly,   R     Comments: while on enoxaparin therapy    Signed and Held   Signed and Held  Comprehensive metabolic panel  Tomorrow morning,   R        Signed and Held   Signed and Held  CBC  Tomorrow morning,   R        Signed and Held            Vitals/Pain Today's Vitals   05/09/23 1932 05/09/23 1933 05/09/23 2030 05/09/23 2209  BP:      Pulse:  77    Resp:      Temp:      TempSrc:      SpO2:  99%    Weight:      Height:      PainSc: 0-No pain  4  6     Isolation Precautions No active isolations  Medications Medications  0.9 %  sodium chloride infusion (has no administration in time range)  ondansetron (ZOFRAN) injection 4 mg (4 mg Intravenous Given 05/09/23 1946)  metoCLOPramide (REGLAN) injection 10 mg (10 mg Intravenous Given 05/09/23 1946)  diphenhydrAMINE (BENADRYL) injection 50 mg (50 mg Intravenous Given 05/09/23 2037)    Mobility non-ambulatory     Focused Assessments See Chart   R Recommendations: See Admitting Provider Note  Report given to:   Additional Notes: See Chart

## 2023-05-09 NOTE — H&P (Signed)
 History and Physical    Patient: Lori [name] ZOX:[id number] DOB: 09-15-1970 DOA: 05/09/2023 DOS: the patient was seen and examined on 05/09/2023 PCP: Ngetich, Donalee Citrin, NP  Patient coming from: Home  Chief Complaint:  Chief Complaint  Patient presents with   Emesis   Nausea   HPI: Lori [name] is a 53 y.o. female with medical history significant of type 2 diabetes with gastroparesis, coronary artery disease status post CABG, chronic diastolic heart failure, obstructive sleep apnea on CPAP, history of CVA with residual left-sided weakness, bed and wheelchair bound, history of DVT on Xarelto, history of PE chromocytoma status post right adrenalectomy, history of hypothyroidism, frequent hospitalizations due to gastroparesis who presents to the ER with intractable nausea vomiting.  She was seen in the ER also yesterday with similar symptoms.  During that.  She was treated symptomatically and discharged home.  Patient returned today with persistent nausea vomiting.  Electrolytes appear to be stable.  Due to persistent symptoms however she has been admitted to the hospital for symptom control.  She appears to be dehydrated.  Patient also appears to have mild AKA.  Glucose is 335.  Not in DKA.  Has some leukocytosis and thrombocytosis.  Review of Systems: As mentioned in the history of present illness. All other systems reviewed and are negative. Past Medical History:  Diagnosis Date   CAD (coronary artery disease)    Depression    Diabetes mellitus without complication (HCC)    History of CT scan    History of mammogram    History of MRI    Hypertension    Pheochromocytoma    Reversible cerebrovascular vasoconstriction syndrome    Stroke [facility])    Thyroid disease    Past Surgical History:  Procedure Laterality Date   ABDOMINAL HYSTERECTOMY     BIOPSY  02/06/2023   Procedure: BIOPSY;  Surgeon: Benancio Deeds, MD;  Location: [facility] ENDOSCOPY;   Service: Gastroenterology;;   CESAREAN SECTION     3   CORONARY ARTERY BYPASS GRAFT     ESOPHAGOGASTRODUODENOSCOPY (EGD) WITH PROPOFOL N/A 02/06/2023   Procedure: ESOPHAGOGASTRODUODENOSCOPY (EGD) WITH PROPOFOL;  Surgeon: Benancio Deeds, MD;  Location: [facility] ENDOSCOPY;  Service: Gastroenterology;  Laterality: N/A;   Right adrenal gland removal for pheochromocytoma Right    Social History:  reports that she has never smoked. She has never used smokeless tobacco. She reports that she does not currently use alcohol. She reports that she does not use drugs.  Allergies  Allergen Reactions   Asa [Aspirin] Anaphylaxis, Swelling and Other (See Comments)    Throat closes   Contrast Media [Iodinated Contrast Media] Anaphylaxis and Swelling   Imitrex [Sumatriptan] Anaphylaxis   Ms Contin [Morphine] Anaphylaxis and Swelling   Penicillins Swelling and Other (See Comments)    Mouth and eyes swell   Firvanq [Vancomycin] Other (See Comments)    Red Man's Syndrome   Cipro [Ciprofloxacin Hcl] Nausea And Vomiting   Nitrofuran Derivatives Anxiety   Wound Dressing Adhesive Itching and Rash    Family History  Problem Relation Age of Onset   Hypertension Mother    Diabetes Mother    Atrial fibrillation Mother    Hypertension Father    Heart attack Father    Dementia Father    Diabetes Brother    Brain cancer Brother    Asthma Daughter    Sickle cell anemia Son    Atrial fibrillation Maternal Uncle     Prior to Admission medications  Medication Sig Start Date End Date Taking? Authorizing Provider  acetaminophen (TYLENOL) 325 MG tablet Take 2 tablets (650 mg total) by mouth every 6 (six) hours as needed for mild pain (pain score 1-3) (or Fever >/= 101). 02/12/23   Rodolph Bong, MD  albuterol (PROVENTIL) (2.5 MG/3ML) 0.083% nebulizer solution Take 3 mLs (2.5 mg total) by nebulization every 6 (six) hours as needed for wheezing or shortness of breath. 01/01/23   Ngetich, Dinah C, NP  albuterol  (VENTOLIN HFA) 108 (90 Base) MCG/ACT inhaler INHALE 2 PUFFS BY MOUTH EVERY 6 HOURS AS NEEDED FOR WHEEZING AND FOR SHORTNESS OF BREATH Strength: 108 (90 Base) MCG/ACT 01/01/23   Ngetich, Dinah C, NP  atorvastatin (LIPITOR) 80 MG tablet TAKE 1 TABLET (80 MG TOTAL) BY MOUTH DAILY. 03/31/23   Ngetich, Dinah C, NP  baclofen (LIORESAL) 10 MG tablet Take 1 tablet (10 mg total) by mouth 3 (three) times daily. 01/08/23   Ngetich, Dinah C, NP  clopidogrel (PLAVIX) 75 MG tablet TAKE 1 TABLET BY MOUTH DAILY 04/09/23   Ngetich, Donalee Citrin, NP  Continuous Glucose Receiver (FREESTYLE LIBRE 2 READER) DEVI 1 Device by Does not apply route daily. E11.69 11/01/22   Ngetich, Dinah C, NP  Continuous Glucose Sensor (FREESTYLE LIBRE 14 DAY SENSOR) MISC 1 each by Does not apply route as needed. DX: E11.69 12/24/22   Ngetich, Dinah C, NP  cyclobenzaprine (FLEXERIL) 5 MG tablet Take 1 tablet (5 mg total) by mouth 3 (three) times daily as needed for muscle spasms. 05/07/23   Ngetich, Dinah C, NP  diclofenac Sodium (VOLTAREN) 1 % GEL Apply 1 Application topically 4 (four) times daily as needed (pain).    [provider]  escitalopram (LEXAPRO) 20 MG tablet Take 1 tablet (20 mg total) by mouth every morning. 01/01/23   Ngetich, Dinah C, NP  ezetimibe (ZETIA) 10 MG tablet TAKE 1 TABLET BY MOUTH DAILY 04/09/23   Ngetich, Dinah C, NP  fluticasone (FLONASE) 50 MCG/ACT nasal spray Place 2 sprays into both nostrils daily for 7 days. Patient not taking: Reported on 04/30/2023 02/12/23 04/30/23  Rodolph Bong, MD  gabapentin (NEURONTIN) 100 MG capsule Take 100 mg by mouth 3 (three) times daily. 11/14/22   [provider]  HUMALOG KWIKPEN 100 UNIT/ML KwikPen INJECT 4 UNITS SUBCUTANEOUSLY THREE TIMES A DAY WITH MEALS 05/07/23   Ngetich, Dinah C, NP  insulin glargine (LANTUS SOLOSTAR) 100 UNIT/ML Solostar Pen Inject 10 Units into the skin at bedtime. 05/07/23   Ngetich, Dinah C, NP  insulin lispro (HUMALOG) 100 UNIT/ML injection Sliding  scale AC, HS CBG 121 - 150: 2 units CBG 151 - 200: 3 units CBG 201 - 250: 5 units CBG 251 - 300: 8 units CBG 301 - 350: 11 units CBG 351 - 400: 15 units CBG > 400: call MD 05/07/23   Ngetich, Dinah C, NP  LORazepam (ATIVAN) 0.5 MG tablet Take 1 tablet (0.5 mg total) by mouth at bedtime. Patient not taking: Reported on 04/30/2023 02/12/23   Rodolph Bong, MD  methimazole (TAPAZOLE) 5 MG tablet TAKE 1 TABLET BY MOUTH EVERY  MORNING 04/09/23   Ngetich, Dinah C, NP  metoprolol tartrate (LOPRESSOR) 100 MG tablet Take 1 tablet (100 mg total) by mouth 2 (two) times daily. Hold for SBP < 110 02/12/23   Rodolph Bong, MD  olmesartan-hydrochlorothiazide (BENICAR HCT) 40-25 MG tablet Take 1 tablet by mouth daily. Patient not taking: Reported on 04/30/2023    [provider]  pantoprazole (PROTONIX) 40 MG tablet Take 1 tablet (40 mg total) by mouth 2 (two) times daily. 04/11/23   Armbruster, Willaim Rayas, MD  potassium chloride SA (KLOR-CON M) 20 MEQ tablet Take 1 tablet (20 mEq total) by mouth 2 (two) times daily for 2 days. 05/01/23 05/03/23  Dorcas Carrow, MD  Prucalopride Succinate (MOTEGRITY) 2 MG TABS Take 1 tablet (2 mg total) by mouth daily. LOT: [id number], EXP: 11-2023 Patient not taking: Reported on 04/30/2023 04/11/23   Armbruster, Willaim Rayas, MD  ranolazine (RANEXA) 500 MG 12 hr tablet Take 1 tablet (500 mg total) by mouth 2 (two) times daily. 10/09/22 01/23/24  [place], Alvira Philips, DO  rivaroxaban (XARELTO) 10 MG TABS tablet TAKE 1 TABLET BY MOUTH DAILY 04/09/23   Ngetich, Dinah C, NP  senna-docusate (SENOKOT-S) 8.6-50 MG tablet Take 1 tablet by mouth 2 (two) times daily. 02/12/23   Rodolph Bong, MD  sorbitol 70 % SOLN Take 30 mLs by mouth daily as needed for moderate constipation. 02/12/23   Rodolph Bong, MD  sucralfate (CARAFATE) 1 GM/10ML suspension Take 10 mLs (1 g total) by mouth at bedtime. Patient not taking: Reported on 04/30/2023 04/11/23   Benancio Deeds, MD  tamsulosin (FLOMAX) 0.4 MG  CAPS capsule Take 1 capsule (0.4 mg total) by mouth at bedtime. 05/07/23   Ngetich, Dinah C, NP  topiramate (TOPAMAX) 25 MG tablet Take 1 tablet (25 mg total) by mouth 2 (two) times daily. 07/10/22   Maurilio Lovely D, DO    Physical Exam: Vitals:   05/09/23 1846 05/09/23 1900 05/09/23 1915 05/09/23 1933  BP: (!) 156/107 (!) 154/113 (!) 141/91   Pulse: (!) 113 (!) 107 (!) 107 77  Resp: 16 17    Temp: 98.9 F (37.2 C)     TempSrc: Oral     SpO2: 98% 98% 99% 99%  Weight: 83.9 kg     Height: 5\' 2"  (1.575 m)      Constitutional: Acutely ill looking, NAD, calm, comfortable Eyes: PERRL, lids and conjunctivae normal ENMT: Mucous membranes are dry. Posterior pharynx clear of any exudate or lesions.Normal dentition.  Neck: normal, supple, no masses, no thyromegaly Respiratory: clear to auscultation bilaterally, no wheezing, no crackles. Normal respiratory effort. No accessory muscle use.  Cardiovascular: Sinus tachycardia, no murmurs / rubs / gallops. No extremity edema. 2+ pedal pulses. No carotid bruits.  Abdomen: no tenderness, no masses palpated. No hepatosplenomegaly. Bowel sounds positive.  Musculoskeletal: Good range of motion, no joint swelling or tenderness, Skin: no rashes, lesions, ulcers. No induration Neurologic: CN 2-12 grossly intact. Sensation intact, DTR normal. Strength 5/5 in all 4.  Psychiatric: Normal judgment and insight. Alert and oriented x 3.  Depressed mood  Data Reviewed:  Temperature 98.9, blood pressure 156/107, pulse 113 respiratory of 17 and sats 98% on room air.  White count is 20.1 platelets 494 total protein 8.2 creatinine 1.04 sodium 133 chloride 97 glucose 335.  Assessment and Plan:  #1 intractable nausea with vomiting: Appears to be due to diabetic gastroparesis.  Patient has had multiple admissions for the same thing.  Will admit the patient.  Start patient on Reglan as well as Zofran.  IV fluids.  Other supportive care.  Once patient able to take p.o.'s, will  discharge home.  #2 dehydration: Aggressively hydrate.  Continue to monitor  #3 AKI: Secondary to dehydration.  Continue to monitor renal function  #4 GERD: On PPIs.  Will likely use IV Protonix.  #5 essential hypertension: Blood pressure  is uncontrolled.  Will use IV medications if needed  #6 coronary artery disease: Stable.  Status post CABG.  Continue to monitor  #7 uncontrolled diabetes: Sliding scale insulin.  Continue to monitor  #8 hyperlipidemia: Continue with statin  #9 history of CVA: Will resume home regimen when ready.  Patient has no residual effects.    Advance Care Planning:   Code Status: Full Code   Consults: None  Family Communication: Husband at bedside  Severity of Illness: The appropriate patient status for this patient is OBSERVATION. Observation status is judged to be reasonable and necessary in order to provide the required intensity of service to ensure the patient's safety. The patient's presenting symptoms, physical exam findings, and initial radiographic and laboratory data in the context of their medical condition is felt to place them at decreased risk for further clinical deterioration. Furthermore, it is anticipated that the patient will be medically stable for discharge from the hospital within 2 midnights of admission.   AuthorLonia Blood, MD 05/09/2023 10:11 PM  For on call review [email or web address].

## 2023-05-10 ENCOUNTER — Other Ambulatory Visit: Payer: Self-pay

## 2023-05-10 ENCOUNTER — Observation Stay (HOSPITAL_COMMUNITY)

## 2023-05-10 ENCOUNTER — Inpatient Hospital Stay (HOSPITAL_COMMUNITY)

## 2023-05-10 DIAGNOSIS — K861 Other chronic pancreatitis: Secondary | ICD-10-CM | POA: Diagnosis not present

## 2023-05-10 DIAGNOSIS — E860 Dehydration: Secondary | ICD-10-CM | POA: Diagnosis present

## 2023-05-10 DIAGNOSIS — D509 Iron deficiency anemia, unspecified: Secondary | ICD-10-CM | POA: Diagnosis present

## 2023-05-10 DIAGNOSIS — N179 Acute kidney failure, unspecified: Secondary | ICD-10-CM | POA: Diagnosis present

## 2023-05-10 DIAGNOSIS — Z794 Long term (current) use of insulin: Secondary | ICD-10-CM | POA: Diagnosis not present

## 2023-05-10 DIAGNOSIS — K219 Gastro-esophageal reflux disease without esophagitis: Secondary | ICD-10-CM | POA: Diagnosis present

## 2023-05-10 DIAGNOSIS — E039 Hypothyroidism, unspecified: Secondary | ICD-10-CM | POA: Diagnosis present

## 2023-05-10 DIAGNOSIS — K3184 Gastroparesis: Secondary | ICD-10-CM | POA: Diagnosis not present

## 2023-05-10 DIAGNOSIS — E1110 Type 2 diabetes mellitus with ketoacidosis without coma: Secondary | ICD-10-CM | POA: Diagnosis present

## 2023-05-10 DIAGNOSIS — R110 Nausea: Secondary | ICD-10-CM | POA: Diagnosis present

## 2023-05-10 DIAGNOSIS — I69354 Hemiplegia and hemiparesis following cerebral infarction affecting left non-dominant side: Secondary | ICD-10-CM | POA: Diagnosis not present

## 2023-05-10 DIAGNOSIS — Z743 Need for continuous supervision: Secondary | ICD-10-CM | POA: Diagnosis not present

## 2023-05-10 DIAGNOSIS — Z7401 Bed confinement status: Secondary | ICD-10-CM | POA: Diagnosis not present

## 2023-05-10 DIAGNOSIS — E1143 Type 2 diabetes mellitus with diabetic autonomic (poly)neuropathy: Secondary | ICD-10-CM | POA: Diagnosis not present

## 2023-05-10 DIAGNOSIS — G4733 Obstructive sleep apnea (adult) (pediatric): Secondary | ICD-10-CM | POA: Diagnosis present

## 2023-05-10 DIAGNOSIS — I69320 Aphasia following cerebral infarction: Secondary | ICD-10-CM | POA: Diagnosis not present

## 2023-05-10 DIAGNOSIS — Z452 Encounter for adjustment and management of vascular access device: Secondary | ICD-10-CM | POA: Diagnosis not present

## 2023-05-10 DIAGNOSIS — E785 Hyperlipidemia, unspecified: Secondary | ICD-10-CM | POA: Diagnosis present

## 2023-05-10 DIAGNOSIS — Z7901 Long term (current) use of anticoagulants: Secondary | ICD-10-CM | POA: Diagnosis not present

## 2023-05-10 DIAGNOSIS — E876 Hypokalemia: Secondary | ICD-10-CM | POA: Diagnosis present

## 2023-05-10 DIAGNOSIS — E871 Hypo-osmolality and hyponatremia: Secondary | ICD-10-CM | POA: Diagnosis present

## 2023-05-10 DIAGNOSIS — F411 Generalized anxiety disorder: Secondary | ICD-10-CM | POA: Diagnosis present

## 2023-05-10 DIAGNOSIS — F32A Depression, unspecified: Secondary | ICD-10-CM | POA: Diagnosis present

## 2023-05-10 DIAGNOSIS — Z86718 Personal history of other venous thrombosis and embolism: Secondary | ICD-10-CM | POA: Diagnosis not present

## 2023-05-10 DIAGNOSIS — E0590 Thyrotoxicosis, unspecified without thyrotoxic crisis or storm: Secondary | ICD-10-CM | POA: Diagnosis present

## 2023-05-10 DIAGNOSIS — I5032 Chronic diastolic (congestive) heart failure: Secondary | ICD-10-CM | POA: Diagnosis present

## 2023-05-10 DIAGNOSIS — G8190 Hemiplegia, unspecified affecting unspecified side: Secondary | ICD-10-CM | POA: Diagnosis not present

## 2023-05-10 DIAGNOSIS — E1169 Type 2 diabetes mellitus with other specified complication: Secondary | ICD-10-CM | POA: Diagnosis present

## 2023-05-10 DIAGNOSIS — I110 Hypertensive heart disease with heart failure: Secondary | ICD-10-CM | POA: Diagnosis present

## 2023-05-10 DIAGNOSIS — Z8249 Family history of ischemic heart disease and other diseases of the circulatory system: Secondary | ICD-10-CM | POA: Diagnosis not present

## 2023-05-10 DIAGNOSIS — R112 Nausea with vomiting, unspecified: Secondary | ICD-10-CM | POA: Diagnosis not present

## 2023-05-10 DIAGNOSIS — K5900 Constipation, unspecified: Secondary | ICD-10-CM | POA: Diagnosis not present

## 2023-05-10 LAB — CBC
HCT: 39.6 % (ref 36.0–46.0)
Hemoglobin: 13 g/dL (ref 12.0–15.0)
MCH: 26.4 pg (ref 26.0–34.0)
MCHC: 32.8 g/dL (ref 30.0–36.0)
MCV: 80.5 fL (ref 80.0–100.0)
Platelets: 490 10*3/uL — ABNORMAL HIGH (ref 150–400)
RBC: 4.92 MIL/uL (ref 3.87–5.11)
RDW: 16 % — ABNORMAL HIGH (ref 11.5–15.5)
WBC: 19.8 10*3/uL — ABNORMAL HIGH (ref 4.0–10.5)
nRBC: 0 % (ref 0.0–0.2)

## 2023-05-10 LAB — BASIC METABOLIC PANEL WITH GFR
Anion gap: 13 (ref 5–15)
BUN: 17 mg/dL (ref 6–20)
CO2: 21 mmol/L — ABNORMAL LOW (ref 22–32)
Calcium: 8.9 mg/dL (ref 8.9–10.3)
Chloride: 106 mmol/L (ref 98–111)
Creatinine, Ser: 0.82 mg/dL (ref 0.44–1.00)
GFR, Estimated: >60 mL/min (ref >60)
Glucose, Bld: 173 mg/dL — ABNORMAL HIGH (ref 70–99)
Potassium: 4 mmol/L (ref 3.5–5.1)
Sodium: 140 mmol/L (ref 135–145)

## 2023-05-10 LAB — GLUCOSE, CAPILLARY
Glucose-Capillary: 456 mg/dL — ABNORMAL HIGH (ref 70–99)
Glucose-Capillary: 483 mg/dL — ABNORMAL HIGH (ref 70–99)
Glucose-Capillary: 570 mg/dL (ref 70–99)
Glucose-Capillary: 395 mg/dL — ABNORMAL HIGH (ref 70–99)
Glucose-Capillary: 345 mg/dL — ABNORMAL HIGH (ref 70–99)
Glucose-Capillary: 257 mg/dL — ABNORMAL HIGH (ref 70–99)
Glucose-Capillary: 222 mg/dL — ABNORMAL HIGH (ref 70–99)
Glucose-Capillary: 218 mg/dL — ABNORMAL HIGH (ref 70–99)
Glucose-Capillary: 216 mg/dL — ABNORMAL HIGH (ref 70–99)
Glucose-Capillary: 163 mg/dL — ABNORMAL HIGH (ref 70–99)
Glucose-Capillary: 153 mg/dL — ABNORMAL HIGH (ref 70–99)
Glucose-Capillary: 171 mg/dL — ABNORMAL HIGH (ref 70–99)
Glucose-Capillary: 214 mg/dL — ABNORMAL HIGH (ref 70–99)
Glucose-Capillary: 223 mg/dL — ABNORMAL HIGH (ref 70–99)

## 2023-05-10 LAB — URINALYSIS, W/ REFLEX TO CULTURE (INFECTION SUSPECTED)
Bilirubin Urine: NEGATIVE
Glucose, UA: >=500 mg/dL — AB
Ketones, ur: 20 mg/dL — AB
Leukocytes,Ua: NEGATIVE
Nitrite: NEGATIVE
Protein, ur: NEGATIVE mg/dL
Specific Gravity, Urine: 1.013 (ref 1.005–1.030)
pH: 5 (ref 5.0–8.0)

## 2023-05-10 LAB — COMPREHENSIVE METABOLIC PANEL WITH GFR
ALT: 22 U/L (ref 0–44)
AST: 16 U/L (ref 15–41)
Albumin: 3.6 g/dL (ref 3.5–5.0)
Alkaline Phosphatase: 81 U/L (ref 38–126)
Anion gap: 17 — ABNORMAL HIGH (ref 5–15)
BUN: 20 mg/dL (ref 6–20)
CO2: 20 mmol/L — ABNORMAL LOW (ref 22–32)
Calcium: 9.5 mg/dL (ref 8.9–10.3)
Chloride: 97 mmol/L — ABNORMAL LOW (ref 98–111)
Creatinine, Ser: 1.04 mg/dL — ABNORMAL HIGH (ref 0.44–1.00)
GFR, Estimated: >60 mL/min (ref >60)
Glucose, Bld: 439 mg/dL — ABNORMAL HIGH (ref 70–99)
Potassium: 3.8 mmol/L (ref 3.5–5.1)
Sodium: 134 mmol/L — ABNORMAL LOW (ref 135–145)
Total Bilirubin: 0.8 mg/dL (ref 0.0–1.2)
Total Protein: 8.2 g/dL — ABNORMAL HIGH (ref 6.5–8.1)

## 2023-05-10 LAB — HIV ANTIBODY (ROUTINE TESTING W REFLEX): HIV Screen 4th Generation wRfx: NONREACTIVE

## 2023-05-10 LAB — BETA-HYDROXYBUTYRIC ACID
Beta-Hydroxybutyric Acid: 2.36 mmol/L — ABNORMAL HIGH (ref 0.05–0.27)
Beta-Hydroxybutyric Acid: 0.99 mmol/L — ABNORMAL HIGH (ref 0.05–0.27)

## 2023-05-10 MED ORDER — ORAL CARE MOUTH RINSE: 15.0000 mL | OROMUCOSAL | Status: DC | PRN

## 2023-05-10 MED ORDER — PANTOPRAZOLE SODIUM 40 MG IV SOLR
40.0000 mg | INTRAVENOUS | Status: DC
  Administered 2023-05-10 – 2023-05-13 (×4): 40 mg via INTRAVENOUS
  Filled 2023-05-10 (×4): qty 10

## 2023-05-10 MED ORDER — DEXTROSE 50 % IV SOLN: 0.0000 mL | INTRAVENOUS | Status: DC | PRN

## 2023-05-10 MED ORDER — ATORVASTATIN CALCIUM 40 MG PO TABS
80.0000 mg | ORAL_TABLET | Freq: Every day | ORAL | Status: DC
  Administered 2023-05-12 – 2023-05-13 (×2): 80 mg via ORAL
  Filled 2023-05-10 (×4): qty 2

## 2023-05-10 MED ORDER — GABAPENTIN 100 MG PO CAPS
100.0000 mg | ORAL_CAPSULE | Freq: Three times a day (TID) | ORAL | Status: DC
  Administered 2023-05-10 – 2023-05-13 (×8): 100 mg via ORAL
  Filled 2023-05-10 (×9): qty 1

## 2023-05-10 MED ORDER — METHIMAZOLE 5 MG PO TABS
5.0000 mg | ORAL_TABLET | Freq: Every morning | ORAL | Status: DC
  Administered 2023-05-11 – 2023-05-13 (×3): 5 mg via ORAL
  Filled 2023-05-10 (×4): qty 1

## 2023-05-10 MED ORDER — POTASSIUM CHLORIDE 10 MEQ/100ML IV SOLN
10.0000 meq | INTRAVENOUS | Status: AC
  Administered 2023-05-10 (×2): 10 meq via INTRAVENOUS
  Filled 2023-05-10 (×2): qty 100

## 2023-05-10 MED ORDER — RIVAROXABAN 10 MG PO TABS
10.0000 mg | ORAL_TABLET | Freq: Every day | ORAL | Status: DC
  Administered 2023-05-11 – 2023-05-13 (×3): 10 mg via ORAL
  Filled 2023-05-10 (×4): qty 1

## 2023-05-10 MED ORDER — LACTATED RINGERS IV SOLN: INTRAVENOUS | Status: DC

## 2023-05-10 MED ORDER — INSULIN REGULAR(HUMAN) IN NACL 100-0.9 UT/100ML-% IV SOLN
INTRAVENOUS | Status: DC
  Administered 2023-05-10: 8.5 [IU]/h via INTRAVENOUS
  Administered 2023-05-10: 13 [IU]/h via INTRAVENOUS
  Filled 2023-05-10 (×3): qty 100

## 2023-05-10 MED ORDER — CHLORHEXIDINE GLUCONATE CLOTH 2 % EX PADS
6.0000 | MEDICATED_PAD | Freq: Every day | CUTANEOUS | Status: DC
  Administered 2023-05-10 – 2023-05-11 (×2): 6 via TOPICAL

## 2023-05-10 MED ORDER — HYDRALAZINE HCL 20 MG/ML IJ SOLN: 10.0000 mg | INTRAMUSCULAR | Status: DC | PRN

## 2023-05-10 MED ORDER — BACLOFEN 10 MG PO TABS
10.0000 mg | ORAL_TABLET | Freq: Three times a day (TID) | ORAL | Status: DC
  Administered 2023-05-10 – 2023-05-13 (×8): 10 mg via ORAL
  Filled 2023-05-10 (×9): qty 1

## 2023-05-10 MED ORDER — METOPROLOL TARTRATE 25 MG PO TABS
100.0000 mg | ORAL_TABLET | Freq: Two times a day (BID) | ORAL | Status: DC
  Administered 2023-05-10 – 2023-05-11 (×3): 100 mg via ORAL
  Filled 2023-05-10 (×6): qty 4

## 2023-05-10 MED ORDER — RANOLAZINE ER 500 MG PO TB12
500.0000 mg | ORAL_TABLET | Freq: Two times a day (BID) | ORAL | Status: DC
Start: 2023-05-10 — End: 2023-05-13
  Administered 2023-05-11 – 2023-05-13 (×5): 500 mg via ORAL
  Filled 2023-05-10 (×8): qty 1

## 2023-05-10 MED ORDER — EZETIMIBE 10 MG PO TABS
10.0000 mg | ORAL_TABLET | Freq: Every day | ORAL | Status: DC
  Administered 2023-05-12 – 2023-05-13 (×2): 10 mg via ORAL
  Filled 2023-05-10 (×4): qty 1

## 2023-05-10 MED ORDER — LABETALOL HCL 5 MG/ML IV SOLN
10.0000 mg | INTRAVENOUS | Status: DC | PRN
  Administered 2023-05-10 – 2023-05-11 (×2): 10 mg via INTRAVENOUS
  Filled 2023-05-10 (×3): qty 4

## 2023-05-10 MED ORDER — DEXTROSE IN LACTATED RINGERS 5 % IV SOLN: INTRAVENOUS | Status: DC

## 2023-05-10 MED ORDER — CYCLOBENZAPRINE HCL 5 MG PO TABS
5.0000 mg | ORAL_TABLET | Freq: Three times a day (TID) | ORAL | Status: DC | PRN
  Administered 2023-05-10: 5 mg via ORAL
  Filled 2023-05-10: qty 1

## 2023-05-10 MED ORDER — CLOPIDOGREL BISULFATE 75 MG PO TABS
75.0000 mg | ORAL_TABLET | Freq: Every day | ORAL | Status: DC
Start: 2023-05-10 — End: 2023-05-13
  Administered 2023-05-11 – 2023-05-13 (×3): 75 mg via ORAL
  Filled 2023-05-10 (×4): qty 1

## 2023-05-10 MED ORDER — LACTATED RINGERS IV BOLUS
20.0000 mL/kg | Freq: Once | INTRAVENOUS | Status: AC
  Administered 2023-05-10: 1678 mL via INTRAVENOUS

## 2023-05-10 MED ORDER — PROCHLORPERAZINE EDISYLATE 10 MG/2ML IJ SOLN
10.0000 mg | Freq: Four times a day (QID) | INTRAMUSCULAR | Status: DC | PRN
  Administered 2023-05-10 – 2023-05-11 (×4): 10 mg via INTRAVENOUS
  Filled 2023-05-10 (×4): qty 2

## 2023-05-10 NOTE — Plan of Care (Signed)
   Problem: Health Behavior/Discharge Planning: Goal: Ability to manage health-related needs will improve Outcome: Progressing   Problem: Clinical Measurements: Goal: Ability to maintain clinical measurements within normal limits will improve Outcome: Progressing Goal: Will remain free from infection Outcome: Progressing

## 2023-05-10 NOTE — Progress Notes (Signed)
 Peripherally Inserted Central Catheter Placement EXCHANGE  The IV Nurse has discussed with the patient and/or persons authorized to consent for the patient, the purpose of this procedure and the potential benefits and risks involved with this procedure.  The benefits include less needle sticks, lab draws from the catheter, and the patient may be discharged home with the catheter. Risks include, but not limited to, infection, bleeding, blood clot (thrombus formation), and puncture of an artery; nerve damage and irregular heartbeat and possibility to perform a PICC exchange if needed/ordered by physician.  Alternatives to this procedure were also discussed.  Bard Power PICC patient education guide, fact sheet on infection prevention and patient information card has been provided to patient /or left at bedside.    PICC Placement Documentation  PICC Double Lumen 05/10/23 Right Basilic 37 cm 1 cm (Active)  Indication for Insertion or Continuance of Line Vasoactive infusions 05/10/23 2126  Exposed Catheter (cm) 1 cm 05/10/23 2126  Site Assessment Clean, Dry, Intact 05/10/23 2126  Lumen #1 Status Flushed;Saline locked;Blood return noted 05/10/23 2126  Lumen #2 Status Flushed;Saline locked;Blood return noted 05/10/23 2126  Dressing Type Transparent;Securing device 05/10/23 2126  Dressing Status Antimicrobial disc/dressing in place 05/10/23 2126  Line Care Connections checked and tightened 05/10/23 2126  Line Adjustment (NICU/IV Team Only) No 05/10/23 2126  Dressing Intervention New dressing;Adhesive placed at insertion site (IV team only) 05/10/23 2126  Dressing Change Due 05/17/23 05/10/23 2126       Elenore Paddy 05/10/2023, 9:27 PM

## 2023-05-10 NOTE — Progress Notes (Signed)
 Peripherally Inserted Central Catheter Placement  The IV Nurse has discussed with the patient and/or persons authorized to consent for the patient, the purpose of this procedure and the potential benefits and risks involved with this procedure.  The benefits include less needle sticks, lab draws from the catheter, and the patient may be discharged home with the catheter. Risks include, but not limited to, infection, bleeding, blood clot (thrombus formation), and puncture of an artery; nerve damage and irregular heartbeat and possibility to perform a PICC exchange if needed/ordered by physician.  Alternatives to this procedure were also discussed.  Bard Power PICC patient education guide, fact sheet on infection prevention and patient information card has been provided to patient /or left at bedside.    PICC Placement Documentation  PICC Single Lumen 05/10/23 Right Brachial 37 cm 0 cm (Active)  Indication for Insertion or Continuance of Line Vasoactive infusions 05/10/23 1709  Exposed Catheter (cm) 0 cm 05/10/23 1709  Site Assessment Clean, Dry, Intact 05/10/23 1709  Line Status Flushed;Saline locked;Blood return noted 05/10/23 1709  Dressing Type Transparent;Securing device 05/10/23 1709  Dressing Status Antimicrobial disc/dressing in place 05/10/23 1709  Line Care Connections checked and tightened 05/10/23 1709  Line Adjustment (NICU/IV Team Only) Yes 05/10/23 1709  Dressing Intervention New dressing;Adhesive placed at insertion site (IV team only) 05/10/23 1709  Dressing Change Due 05/17/23 05/10/23 1709       Vernona Rieger  [place] 05/10/2023, 5:11 PM

## 2023-05-10 NOTE — Inpatient Diabetes Management (Signed)
 Inpatient Diabetes Program Recommendations  AACE/ADA: New Consensus Statement on Inpatient Glycemic Control (2015)  Target Ranges:  Prepandial:   less than 140 mg/dL      Peak postprandial:   less than 180 mg/dL (1-2 hours)      Critically ill patients:  140 - 180 mg/dL   Lab Results  Component Value Date   GLUCAP 345 (H) 05/10/2023   HGBA1C 10.0 (H) 04/25/2023    Diabetes history: DM2 Outpatient Diabetes medications: Lantus 14 units daily,Humalog 4 units tid meal coverage Current orders for Inpatient glycemic control: IV insulin  Inpatient Diabetes Program Recommendations:   Attempted to speak with patient (via phone-DM coordinator on weekend system wide call) but patient is sleeping. Spoke with aide and will follow up during hospitalization. Office visit 04/03/23 with NP Dinah Ngetich listed insulin adjusted to Lantus 14 units daily @ this visit.  Thank you, Lori [name]. Lori Janowicz, RN, MSN, CDCES  Diabetes Coordinator Inpatient Glycemic Control Team Team Pager [phone] (8am-5pm) 05/10/2023 11:16 AM

## 2023-05-10 NOTE — Plan of Care (Signed)

## 2023-05-10 NOTE — Progress Notes (Addendum)
 At bedside for PICC assessment and retraction of line due to malposition. GBR in both lumens. Removed current dressing and retracted 1 cm, however, the line spontaneously came out 5 cm. Cleaned site per protocol and redressed. Stat CXR ordered to reassess placement. Once results are obtained, the line may be exchanged or removed and other IV access will be obtained. RN aware,

## 2023-05-10 NOTE — Progress Notes (Addendum)
 PROGRESS NOTE  Lori [name]  WGN:[id number] DOB: 1970/06/30 DOA: 05/09/2023 PCP: Caesar Bookman, NP   Brief Narrative: Patient is a 53 year old female with history of diabetes type 2 with gastroparesis, coronary disease status post CABG, chronic diastolic CHF, OSA on CPAP, CVA with residual left-sided weakness, wheelchair-bound, history of DVT on Xarelto, history of PE, hypothyroidism, frequent hospitalization for gastroparesis who presented with symptoms of intractable nausea, vomiting.  Patient was recently admitted and was discharged at the end of the March when she presented with diabetic ketoacidosis.  Overnight, her anion gap widened, she became hyperglycemic, tachycardic.  Found to have DKA.  Currently on insulin drip.  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  Anion gap widened this morning with hyperglycemia.  Starting on insulin drip.  Monitor BMP every 4 hours.  Continue DKA protocol.  History of diabetes gastroparesis: Chronic problem.  Continue Reglan for now, IV fluids  History of stroke with residual left hemiplegia, left upper extremity contractures: Patient is bedbound, wheelchair dependent at home.  On her last admission, she was recommended SNF on discharge.  She wanted to go home  Leukocytosis/sinus tachycardia.HTN: This is most likely secondary to dehydration, volume depletion from DKA.  Continue IV fluid.  Low suspicion for acute  infectious process.  Urinalysis doesnot suggest UTI. Home medications metoprolol restarted.  History of hyperthyroidism: On methimazole  History of hyperlipidemia: On Lipitor, Zetia  History of DVT: On Xarelto  History of depression/anxiety: On Lexapro  History of sleep apnea: On CPAP  History of coronary artery disease: Status post CABG.  No chest pain.  On Plavix.  On metoprolol, Ranexa        DVT prophylaxis:enoxaparin (LOVENOX) injection 40 mg Start: 05/10/23 1000     Code Status: Full Code  Family Communication: called husband Mr Kern Alberta on phone for update on 4/5,call not received  Patient status:Inpatient  Patient is from :Home  Anticipated discharge HQ:[facility]  Estimated DC date:1-2 days   Consultants: None  Procedures:None  Antimicrobials:  Anti-infectives (From admission, onward)    None       Subjective: Patient seen and examined at the bedside today.  She has been transferred to stepdown.  EKG monitor showed sinus tachycardia.  Continues to have some nausea.  Abdomen is benign on examination.  Appears very deconditioned  Objective: Vitals:   05/09/23 2244 05/10/23 0053 05/10/23 0248 05/10/23 0445  BP: (!) 164/109 (!) 169/119 (!) 154/99 (!) 153/97  Pulse: (!) 117 (!) 124 (!) 122 (!) 137  Resp: 16 18  16   Temp: 98.4 F (36.9 C) 98.2 F (36.8 C)  99.1 F (37.3 C)  TempSrc: Oral Oral  Oral  SpO2: 98% 100% 97% 98%  Weight:      Height:        Intake/Output Summary (Last 24 hours) at 05/10/2023 0828 Last data filed at 05/10/2023 0653 Gross per 24 hour  Intake 734.92 ml  Output 200 ml  Net 534.92 ml   [employer]  05/09/23 1846  Weight: 83.9 kg    Examination:  General exam: Not in apparent distress, chronically deconditioned HEENT: PERRL Respiratory system:  no wheezes or crackles  Cardiovascular system: Sinus tachycardia Gastrointestinal system: Abdomen is nondistended, soft and nontender. Central nervous system: Alert and  oriented, dysarthria, left hemiplegia, contracture of left upper extremity Extremities: No edema, no clubbing ,no cyanosis Skin: No rashes, no ulcers,no icterus     Data Reviewed: I have personally reviewed following labs and imaging studies  CBC: Recent Labs  Lab 05/08/23 1434 05/09/23 1948 05/10/23 0603  WBC 14.9* 20.1* 19.8*  NEUTROABS  --  15.8*  --   HGB 13.2 13.4 13.0  HCT 39.6 41.5 39.6  MCV 78.6* 81.2 80.5  PLT 473* 494* 490*   Basic Metabolic Panel: Recent Labs  Lab 05/08/23 1434 05/09/23 1948 05/10/23 0603  NA 135 133* 134*  K 3.7 4.8 3.8  CL 98 97* 97*  CO2 24 23 20*  GLUCOSE 319* 335* 439*  BUN 17 19 20   CREATININE 0.81 1.04* 1.04*  CALCIUM 9.7 9.5 9.5     No results found for this or any previous visit (from the past 240 hours).   Radiology Studies: No results found.  Scheduled Meds:  enoxaparin (LOVENOX) injection  40 mg Subcutaneous Q24H   metoCLOPramide (REGLAN) injection  5 mg Intravenous Q6H   pantoprazole (PROTONIX) IV  40 mg Intravenous Q24H   Continuous Infusions:  dextrose 5% lactated ringers     insulin     lactated ringers     lactated ringers     potassium chloride       LOS: 0 days   Burnadette Pop, MD [facility] P4/06/2023, 8:28 AM

## 2023-05-11 DIAGNOSIS — E1143 Type 2 diabetes mellitus with diabetic autonomic (poly)neuropathy: Secondary | ICD-10-CM | POA: Diagnosis not present

## 2023-05-11 DIAGNOSIS — R112 Nausea with vomiting, unspecified: Secondary | ICD-10-CM | POA: Diagnosis not present

## 2023-05-11 DIAGNOSIS — K5900 Constipation, unspecified: Secondary | ICD-10-CM

## 2023-05-11 DIAGNOSIS — K3184 Gastroparesis: Secondary | ICD-10-CM | POA: Diagnosis not present

## 2023-05-11 DIAGNOSIS — R110 Nausea: Principal | ICD-10-CM

## 2023-05-11 LAB — BASIC METABOLIC PANEL WITH GFR
Anion gap: 11 (ref 5–15)
BUN: 10 mg/dL (ref 6–20)
CO2: 22 mmol/L (ref 22–32)
Calcium: 8.4 mg/dL — ABNORMAL LOW (ref 8.9–10.3)
Chloride: 102 mmol/L (ref 98–111)
Creatinine, Ser: 0.72 mg/dL (ref 0.44–1.00)
GFR, Estimated: >60 mL/min (ref >60)
Glucose, Bld: 204 mg/dL — ABNORMAL HIGH (ref 70–99)
Potassium: 2.9 mmol/L — ABNORMAL LOW (ref 3.5–5.1)
Sodium: 135 mmol/L (ref 135–145)

## 2023-05-11 LAB — CBC
HCT: 27.5 % — ABNORMAL LOW (ref 36.0–46.0)
HCT: 30.6 % — ABNORMAL LOW (ref 36.0–46.0)
Hemoglobin: 9 g/dL — ABNORMAL LOW (ref 12.0–15.0)
Hemoglobin: 10.2 g/dL — ABNORMAL LOW (ref 12.0–15.0)
MCH: 26.6 pg (ref 26.0–34.0)
MCH: 26.4 pg (ref 26.0–34.0)
MCHC: 32.7 g/dL (ref 30.0–36.0)
MCHC: 33.3 g/dL (ref 30.0–36.0)
MCV: 81.4 fL (ref 80.0–100.0)
MCV: 79.3 fL — ABNORMAL LOW (ref 80.0–100.0)
Platelets: 367 10*3/uL (ref 150–400)
Platelets: 390 10*3/uL (ref 150–400)
RBC: 3.38 MIL/uL — ABNORMAL LOW (ref 3.87–5.11)
RBC: 3.86 MIL/uL — ABNORMAL LOW (ref 3.87–5.11)
RDW: 16.3 % — ABNORMAL HIGH (ref 11.5–15.5)
RDW: 16.2 % — ABNORMAL HIGH (ref 11.5–15.5)
WBC: 13 10*3/uL — ABNORMAL HIGH (ref 4.0–10.5)
WBC: 12.9 10*3/uL — ABNORMAL HIGH (ref 4.0–10.5)
nRBC: 0 % (ref 0.0–0.2)
nRBC: 0 % (ref 0.0–0.2)

## 2023-05-11 LAB — GLUCOSE, CAPILLARY
Glucose-Capillary: 143 mg/dL — ABNORMAL HIGH (ref 70–99)
Glucose-Capillary: 125 mg/dL — ABNORMAL HIGH (ref 70–99)
Glucose-Capillary: 164 mg/dL — ABNORMAL HIGH (ref 70–99)
Glucose-Capillary: 204 mg/dL — ABNORMAL HIGH (ref 70–99)
Glucose-Capillary: 159 mg/dL — ABNORMAL HIGH (ref 70–99)
Glucose-Capillary: 162 mg/dL — ABNORMAL HIGH (ref 70–99)
Glucose-Capillary: 124 mg/dL — ABNORMAL HIGH (ref 70–99)
Glucose-Capillary: 171 mg/dL — ABNORMAL HIGH (ref 70–99)
Glucose-Capillary: 184 mg/dL — ABNORMAL HIGH (ref 70–99)
Glucose-Capillary: 149 mg/dL — ABNORMAL HIGH (ref 70–99)
Glucose-Capillary: 210 mg/dL — ABNORMAL HIGH (ref 70–99)
Glucose-Capillary: 180 mg/dL — ABNORMAL HIGH (ref 70–99)
Glucose-Capillary: 170 mg/dL — ABNORMAL HIGH (ref 70–99)

## 2023-05-11 LAB — MAGNESIUM: Magnesium: 1.4 mg/dL — ABNORMAL LOW (ref 1.7–2.4)

## 2023-05-11 LAB — TSH: TSH: 0.437 u[IU]/mL (ref 0.350–4.500)

## 2023-05-11 MED ORDER — BISACODYL 10 MG RE SUPP: 10.0000 mg | Freq: Once | RECTAL | Status: DC

## 2023-05-11 MED ORDER — POTASSIUM CHLORIDE 20 MEQ PO PACK
40.0000 meq | PACK | Freq: Once | ORAL | Status: AC
  Administered 2023-05-11: 40 meq via ORAL
  Filled 2023-05-11: qty 2

## 2023-05-11 MED ORDER — METOCLOPRAMIDE HCL 5 MG/ML IJ SOLN
10.0000 mg | Freq: Four times a day (QID) | INTRAMUSCULAR | Status: DC
  Administered 2023-05-11 – 2023-05-13 (×8): 10 mg via INTRAVENOUS
  Filled 2023-05-11 (×8): qty 2

## 2023-05-11 MED ORDER — IRBESARTAN 300 MG PO TABS
300.0000 mg | ORAL_TABLET | Freq: Every day | ORAL | Status: DC
  Administered 2023-05-12: 300 mg via ORAL
  Filled 2023-05-11 (×2): qty 1

## 2023-05-11 MED ORDER — LORAZEPAM 0.5 MG PO TABS
0.5000 mg | ORAL_TABLET | Freq: Every evening | ORAL | Status: DC | PRN
  Administered 2023-05-11 – 2023-05-12 (×3): 0.5 mg via ORAL
  Filled 2023-05-11 (×3): qty 1

## 2023-05-11 MED ORDER — POTASSIUM CHLORIDE 10 MEQ/100ML IV SOLN
10.0000 meq | INTRAVENOUS | Status: AC
  Administered 2023-05-11 (×6): 10 meq via INTRAVENOUS
  Filled 2023-05-11 (×6): qty 100

## 2023-05-11 MED ORDER — SODIUM CHLORIDE 0.9% FLUSH
10.0000 mL | Freq: Two times a day (BID) | INTRAVENOUS | Status: DC
  Administered 2023-05-11 – 2023-05-13 (×5): 10 mL

## 2023-05-11 MED ORDER — HYDROCHLOROTHIAZIDE 25 MG PO TABS
25.0000 mg | ORAL_TABLET | Freq: Every day | ORAL | Status: DC
  Administered 2023-05-11 – 2023-05-12 (×2): 25 mg via ORAL
  Filled 2023-05-11 (×2): qty 1

## 2023-05-11 MED ORDER — MAGNESIUM SULFATE 2 GM/50ML IV SOLN
2.0000 g | Freq: Once | INTRAVENOUS | Status: AC
  Administered 2023-05-11: 2 g via INTRAVENOUS
  Filled 2023-05-11: qty 50

## 2023-05-11 MED ORDER — LACTATED RINGERS IV SOLN: INTRAVENOUS | Status: AC

## 2023-05-11 MED ORDER — SODIUM CHLORIDE 0.9% FLUSH: 10.0000 mL | INTRAVENOUS | Status: DC | PRN

## 2023-05-11 MED ORDER — MENTHOL 3 MG MT LOZG
1.0000 | LOZENGE | OROMUCOSAL | Status: DC | PRN
  Filled 2023-05-11: qty 9

## 2023-05-11 MED ORDER — SUCRALFATE 1 GM/10ML PO SUSP
1.0000 g | Freq: Three times a day (TID) | ORAL | Status: DC
  Administered 2023-05-11 – 2023-05-13 (×7): 1 g via ORAL
  Filled 2023-05-11 (×8): qty 10

## 2023-05-11 MED ORDER — OLMESARTAN MEDOXOMIL-HCTZ 40-25 MG PO TABS: 1.0000 | ORAL_TABLET | Freq: Every day | ORAL | Status: DC

## 2023-05-11 MED ORDER — INSULIN ASPART 100 UNIT/ML IJ SOLN
0.0000 [IU] | Freq: Three times a day (TID) | INTRAMUSCULAR | Status: DC
  Administered 2023-05-11: 3 [IU] via SUBCUTANEOUS
  Administered 2023-05-11 – 2023-05-13 (×5): 2 [IU] via SUBCUTANEOUS

## 2023-05-11 MED ORDER — INSULIN GLARGINE-YFGN 100 UNIT/ML ~~LOC~~ SOLN
14.0000 [IU] | Freq: Every day | SUBCUTANEOUS | Status: DC
  Administered 2023-05-11 – 2023-05-13 (×3): 14 [IU] via SUBCUTANEOUS
  Filled 2023-05-11 (×3): qty 0.14

## 2023-05-11 NOTE — Consult Note (Signed)
 Inpatient Consultation   Referring Provider:     Dr. Burnadette Pop Primary Care Physician:  Caesar Bookman, NP Primary Gastroenterologist:       Dr. Ileene Patrick Reason for Consultation:     GERD, dysphagia, gastroparesis         HPI  Lori [name] is a 53 y.o. female with history of diabetes type 2 with gastroparesis, coronary disease status post CABG, chronic diastolic CHF, OSA on CPAP, CVA with residual left-sided weakness, wheelchair-bound, history of DVT on Xarelto, history of PE, hypothyroidism, frequent hospitalization for gastroparesis admitted with symptoms of intractable nausea, vomiting for which GI is consulted.  In speaking with Lori [name] and reviewing her prior medical records, she was recently admitted to the hospital in March 2025 with DKA treated with IV insulin.  She endorsed abdominal pain during that admission  -CTAP showed findings suggestive of chronic pancreatitis but no acute abnormality.    She was readmitted for 05/07/23 with symptoms of nausea and vomiting without evidence of DKA.  She describes not having a bowel movement for 4 days with associated abdominal fullness.  Describes her emesis as nonbloody and nonbilious in nature.  Endorses some generalized abdominal discomfort.  No fevers or chills.  No cross-sectional imaging performed this admission.  Normal lipase.  She tells me that she thinks her symptoms are improving.  Indicates that vomiting is resolving and nausea is better giving a "thumbs up sign".  Reports some symptoms of throat discomfort and odynophagia.  Denies fevers, chills, dysuria, flank pain or new focal neurologic deficits  She is followed in the gastroenterology office by Dr. Adela Lank and has had an extensive previous evaluation: -- Solid-phase GES 12/27/2022: Delayed gastric emptying -- MRI brain 01/30/2024: No acute intracranial process -- CTAP 02/04/2023: Chronic pancreatitis, no acute changes -- EGD  02/06/2023: LA grade C esophagitis, gastric polyp, duodenal diverticulum, duodenal polyp -- CTAP 04/27/2023: Chronic pancreatitis, no acute changes, small hiatal hernia -- Previous laboratory tests have included normal thyroid studies and cortisol  At the time of her last visit with Dr. Adela Lank 04/11/2023, recommendations included: --  Increase protonix to BID dosing --  Consider switching Reglan to Gimoti 15mg  TID (if covered by insurance) --  Trial of Carafate 10 cc nightly --  Trial of Motegrity   Past Medical History:  Diagnosis Date   CAD (coronary artery disease)    Depression    Diabetes mellitus without complication (HCC)    History of CT scan    History of mammogram    History of MRI    Hypertension    Pheochromocytoma    Reversible cerebrovascular vasoconstriction syndrome    Stroke [facility])    Thyroid disease     Past Surgical History:  Procedure Laterality Date   ABDOMINAL HYSTERECTOMY     BIOPSY  02/06/2023   Procedure: BIOPSY;  Surgeon: Benancio Deeds, MD;  Location: [facility];  Service: Gastroenterology;;   CESAREAN SECTION     3   CORONARY ARTERY BYPASS GRAFT     ESOPHAGOGASTRODUODENOSCOPY (EGD) WITH PROPOFOL N/A 02/06/2023   Procedure: ESOPHAGOGASTRODUODENOSCOPY (EGD) WITH PROPOFOL;  Surgeon: Benancio Deeds, MD;  Location: [facility];  Service: Gastroenterology;  Laterality: N/A;   Right adrenal gland removal for pheochromocytoma Right     Family History  Problem Relation Age of Onset   Hypertension Mother    Diabetes Mother    Atrial fibrillation Mother    Hypertension Father    Heart attack  Father    Dementia Father    Diabetes Brother    Brain cancer Brother    Asthma Daughter    Sickle cell anemia Son    Atrial fibrillation Maternal Uncle     Social History   Tobacco Use   Smoking status: Never   Smokeless tobacco: Never  Vaping Use   Vaping status: Never Used  Substance Use Topics   Alcohol use: Not Currently   Drug use:  Never    Prior to Admission medications   Medication Sig Start Date End Date Taking? Authorizing Provider  acetaminophen (TYLENOL) 325 MG tablet Take 2 tablets (650 mg total) by mouth every 6 (six) hours as needed for mild pain (pain score 1-3) (or Fever >/= 101). 02/12/23   Rodolph Bong, MD  albuterol (PROVENTIL) (2.5 MG/3ML) 0.083% nebulizer solution Take 3 mLs (2.5 mg total) by nebulization every 6 (six) hours as needed for wheezing or shortness of breath. 01/01/23   Ngetich, Dinah C, NP  albuterol (VENTOLIN HFA) 108 (90 Base) MCG/ACT inhaler INHALE 2 PUFFS BY MOUTH EVERY 6 HOURS AS NEEDED FOR WHEEZING AND FOR SHORTNESS OF BREATH Strength: 108 (90 Base) MCG/ACT 01/01/23   Ngetich, Dinah C, NP  atorvastatin (LIPITOR) 80 MG tablet TAKE 1 TABLET (80 MG TOTAL) BY MOUTH DAILY. 03/31/23   Ngetich, Dinah C, NP  baclofen (LIORESAL) 10 MG tablet Take 1 tablet (10 mg total) by mouth 3 (three) times daily. 01/08/23   Ngetich, Dinah C, NP  clopidogrel (PLAVIX) 75 MG tablet TAKE 1 TABLET BY MOUTH DAILY 04/09/23   Ngetich, Donalee Citrin, NP  Continuous Glucose Receiver (FREESTYLE LIBRE 2 READER) DEVI 1 Device by Does not apply route daily. E11.69 11/01/22   Ngetich, Dinah C, NP  Continuous Glucose Sensor (FREESTYLE LIBRE 14 DAY SENSOR) MISC 1 each by Does not apply route as needed. DX: E11.69 12/24/22   Ngetich, Dinah C, NP  cyclobenzaprine (FLEXERIL) 5 MG tablet Take 1 tablet (5 mg total) by mouth 3 (three) times daily as needed for muscle spasms. 05/07/23   Ngetich, Dinah C, NP  diclofenac Sodium (VOLTAREN) 1 % GEL Apply 1 Application topically 4 (four) times daily as needed (pain).    [provider]  escitalopram (LEXAPRO) 20 MG tablet Take 1 tablet (20 mg total) by mouth every morning. 01/01/23   Ngetich, Dinah C, NP  ezetimibe (ZETIA) 10 MG tablet TAKE 1 TABLET BY MOUTH DAILY 04/09/23   Ngetich, Dinah C, NP  fluticasone (FLONASE) 50 MCG/ACT nasal spray Place 2 sprays into both nostrils daily for 7 days.  02/12/23 05/10/23  Rodolph Bong, MD  gabapentin (NEURONTIN) 100 MG capsule Take 100 mg by mouth 3 (three) times daily. 11/14/22   [provider]  HUMALOG KWIKPEN 100 UNIT/ML KwikPen INJECT 4 UNITS SUBCUTANEOUSLY THREE TIMES A DAY WITH MEALS Patient taking differently: Inject 4 Units into the skin with breakfast, with lunch, and with evening meal. 05/07/23   Ngetich, Dinah C, NP  insulin glargine (LANTUS SOLOSTAR) 100 UNIT/ML Solostar Pen Inject 10 Units into the skin at bedtime. 05/07/23   Ngetich, Dinah C, NP  insulin lispro (HUMALOG) 100 UNIT/ML injection Sliding scale AC, HS CBG 121 - 150: 2 units CBG 151 - 200: 3 units CBG 201 - 250: 5 units CBG 251 - 300: 8 units CBG 301 - 350: 11 units CBG 351 - 400: 15 units CBG > 400: call MD 05/07/23   Ngetich, Dinah C, NP  LORazepam (ATIVAN) 0.5 MG tablet Take  1 tablet (0.5 mg total) by mouth at bedtime. Patient not taking: Reported on 04/30/2023 02/12/23   Rodolph Bong, MD  methimazole (TAPAZOLE) 5 MG tablet TAKE 1 TABLET BY MOUTH EVERY  MORNING 04/09/23   Ngetich, Dinah C, NP  metoprolol tartrate (LOPRESSOR) 100 MG tablet Take 1 tablet (100 mg total) by mouth 2 (two) times daily. Hold for SBP < 110 02/12/23   Rodolph Bong, MD  olmesartan-hydrochlorothiazide (BENICAR HCT) 40-25 MG tablet Take 1 tablet by mouth daily. Patient not taking: Reported on 04/30/2023    [provider]  pantoprazole (PROTONIX) 40 MG tablet Take 1 tablet (40 mg total) by mouth 2 (two) times daily. 04/11/23   Armbruster, Willaim Rayas, MD  potassium chloride SA (KLOR-CON M) 20 MEQ tablet Take 1 tablet (20 mEq total) by mouth 2 (two) times daily for 2 days. 05/01/23 05/10/23  Dorcas Carrow, MD  Prucalopride Succinate (MOTEGRITY) 2 MG TABS Take 1 tablet (2 mg total) by mouth daily. LOT: [id number], EXP: 11-2023 Patient not taking: Reported on 04/30/2023 04/11/23   Armbruster, Willaim Rayas, MD  ranolazine (RANEXA) 500 MG 12 hr tablet Take 1 tablet (500 mg total) by mouth 2 (two) times  daily. 10/09/22 01/23/24  [place], Alvira Philips, DO  rivaroxaban (XARELTO) 10 MG TABS tablet TAKE 1 TABLET BY MOUTH DAILY 04/09/23   Ngetich, Dinah C, NP  senna-docusate (SENOKOT-S) 8.6-50 MG tablet Take 1 tablet by mouth 2 (two) times daily. 02/12/23   Rodolph Bong, MD  sorbitol 70 % SOLN Take 30 mLs by mouth daily as needed for moderate constipation. 02/12/23   Rodolph Bong, MD  sucralfate (CARAFATE) 1 GM/10ML suspension Take 10 mLs (1 g total) by mouth at bedtime. Patient not taking: Reported on 04/30/2023 04/11/23   Benancio Deeds, MD  tamsulosin (FLOMAX) 0.4 MG CAPS capsule Take 1 capsule (0.4 mg total) by mouth at bedtime. 05/07/23   Ngetich, Dinah C, NP  topiramate (TOPAMAX) 25 MG tablet Take 1 tablet (25 mg total) by mouth 2 (two) times daily. 07/10/22   Maurilio Lovely D, DO    Current Facility-Administered Medications  Medication Dose Route Frequency Provider Last Rate Last Admin   atorvastatin (LIPITOR) tablet 80 mg  80 mg Oral Daily Adhikari, Amrit, MD       baclofen (LIORESAL) tablet 10 mg  10 mg Oral TID Burnadette Pop, MD   10 mg at 05/11/23 1111   bisacodyl (DULCOLAX) suppository 10 mg  10 mg Rectal Once Burnadette Pop, MD       Chlorhexidine Gluconate Cloth 2 % PADS 6 each  6 each Topical Daily Adhikari, Amrit, MD   6 each at 05/11/23 1121   clopidogrel (PLAVIX) tablet 75 mg  75 mg Oral Daily Burnadette Pop, MD   75 mg at 05/11/23 1111   cyclobenzaprine (FLEXERIL) tablet 5 mg  5 mg Oral TID PRN Burnadette Pop, MD   5 mg at 05/10/23 2256   dextrose 50 % solution 0-50 mL  0-50 mL Intravenous PRN Burnadette Pop, MD       ezetimibe (ZETIA) tablet 10 mg  10 mg Oral Daily Adhikari, Amrit, MD       gabapentin (NEURONTIN) capsule 100 mg  100 mg Oral TID Burnadette Pop, MD   100 mg at 05/11/23 1111   hydrALAZINE (APRESOLINE) injection 10 mg  10 mg Intravenous Q4H PRN Lewie Chamber, MD       irbesartan (AVAPRO) tablet 300 mg  300 mg Oral Daily Herby Abraham,  RPH       And    hydrochlorothiazide (HYDRODIURIL) tablet 25 mg  25 mg Oral Daily Len Childs T, RPH   25 mg at 05/11/23 1111   insulin aspart (novoLOG) injection 0-9 Units  0-9 Units Subcutaneous TID WC Burnadette Pop, MD   3 Units at 05/11/23 1151   insulin glargine-yfgn (SEMGLEE) injection 14 Units  14 Units Subcutaneous Daily Burnadette Pop, MD   14 Units at 05/11/23 2130   labetalol (NORMODYNE) injection 10 mg  10 mg Intravenous Q4H PRN Lewie Chamber, MD   10 mg at 05/11/23 0211   lactated ringers infusion   Intravenous Continuous Burnadette Pop, MD 75 mL/hr at 05/11/23 1052 Infusion Verify at 05/11/23 1052   LORazepam (ATIVAN) tablet 0.5 mg  0.5 mg Oral QHS PRN Lewie Chamber, MD   0.5 mg at 05/11/23 0211   menthol-cetylpyridinium (CEPACOL) lozenge 3 mg  1 lozenge Oral PRN Lewie Chamber, MD       methimazole (TAPAZOLE) tablet 5 mg  5 mg Oral q morning Burnadette Pop, MD   5 mg at 05/11/23 1119   metoCLOPramide (REGLAN) injection 10 mg  10 mg Intravenous Q6H Adhikari, Amrit, MD       metoprolol tartrate (LOPRESSOR) tablet 100 mg  100 mg Oral BID Burnadette Pop, MD   100 mg at 05/11/23 0828   ondansetron (ZOFRAN) tablet 4 mg  4 mg Oral Q6H PRN Rometta Emery, MD       Or   ondansetron (ZOFRAN) injection 4 mg  4 mg Intravenous Q6H PRN Rometta Emery, MD   4 mg at 05/10/23 2209   Oral care mouth rinse  15 mL Mouth Rinse PRN Burnadette Pop, MD       pantoprazole (PROTONIX) injection 40 mg  40 mg Intravenous Q24H Lewie Chamber, MD   40 mg at 05/11/23 0500   potassium chloride 10 mEq in 100 mL IVPB  10 mEq Intravenous Q1 Hr x 6 Adhikari, Amrit, MD 100 mL/hr at 05/11/23 1519 10 mEq at 05/11/23 1519   prochlorperazine (COMPAZINE) injection 10 mg  10 mg Intravenous Q6H PRN Lewie Chamber, MD   10 mg at 05/11/23 0530   ranolazine (RANEXA) 12 hr tablet 500 mg  500 mg Oral BID Burnadette Pop, MD   500 mg at 05/11/23 1121   rivaroxaban (XARELTO) tablet 10 mg  10 mg Oral Daily Burnadette Pop, MD   10 mg  at 05/11/23 1118   sodium chloride flush (NS) 0.9 % injection 10-40 mL  10-40 mL Intracatheter Q12H Adhikari, Amrit, MD   10 mL at 05/11/23 1120   sodium chloride flush (NS) 0.9 % injection 10-40 mL  10-40 mL Intracatheter PRN Burnadette Pop, MD       sucralfate (CARAFATE) 1 GM/10ML suspension 1 g  1 g Oral TID WC & HS Burnadette Pop, MD        Allergies as of 05/09/2023 - Reviewed 05/09/2023  Allergen Reaction Noted   Asa [aspirin] Anaphylaxis, Swelling, and Other (See Comments) 01/23/2022   Contrast media [iodinated contrast media] Anaphylaxis and Swelling 01/20/2021   Imitrex [sumatriptan] Anaphylaxis 01/19/2021   Ms contin [morphine] Anaphylaxis and Swelling 11/02/2021   Penicillins Swelling and Other (See Comments) 01/19/2021   Firvanq [vancomycin] Other (See Comments) 07/04/2022   Cipro [ciprofloxacin hcl] Nausea And Vomiting    Nitrofuran derivatives Anxiety 12/30/2022   Wound dressing adhesive Itching and Rash 11/04/2022    GI Review of Symptoms Significant for constipation, nausea, vomiting. Otherwise negative.  General Review of Systems  Review of systems is significant for the pertinent positives and negatives as listed per the HPI.  Full ROS is otherwise negative.    Physical Exam  Vital signs in last 24 hours: Temp:  [98.3 F (36.8 C)-98.8 F (37.1 C)] 98.3 F (36.8 C) (04/06 1142) Pulse Rate:  [93-135] 93 (04/06 1500) Resp:  [8-32] 27 (04/06 1500) BP: (105-171)/(50-112) 119/90 (04/06 1500) SpO2:  [91 %-97 %] 95 % (04/06 1500) Last BM Date : 05/06/23 General: Chronically ill debilitated woman resting quietly in bed no distress, speech noteworthy for dysarthria Head:  Normocephalic and atraumatic. Eyes:   PEERL, EOMI. No icterus. Conjunctiva pink. Lungs: Respirations even and unlabored. Lungs clear to auscultation bilaterally.   No wheezes, crackles, or rhonchi.  Heart: Normal S1, S2. No MRG. Regular rate and rhythm. No peripheral edema, cyanosis or pallor.   Abdomen:  Soft, nondistended, nontender. No rebound or guarding. Normal bowel sounds. No appreciable masses or hepatomegaly. Rectal:  Deferred  Msk:  Symmetrical without gross deformities. Peripheral pulses intact.    Lab Results Recent Labs    05/10/23 0603 05/11/23 0136 05/11/23 0851  WBC 19.8* 13.0* 12.9*  HGB 13.0 9.0* 10.2*  HCT 39.6 27.5* 30.6*  PLT 490* 367 390   BMET Recent Labs    05/10/23 0603 05/10/23 1840 05/11/23 0851  NA 134* 140 135  K 3.8 4.0 2.9*  CL 97* 106 102  CO2 20* 21* 22  GLUCOSE 439* 173* 204*  BUN 20 17 10   CREATININE 1.04* 0.82 0.72  CALCIUM 9.5 8.9 8.4*   LFT Recent Labs    05/10/23 0603  PROT 8.2*  ALBUMIN 3.6  AST 16  ALT 22  ALKPHOS 81  BILITOT 0.8   PT/INR No results for input(s): "LABPROT", "INR" in the last 72 hours.  -- Previous laboratory tests have included normal thyroid studies and cortisol  Radiographic Studies  -- Solid-phase GES 12/27/2022: Delayed gastric emptying -- MRI brain 01/30/2024: No acute intracranial process -- CTAP 02/04/2023: Chronic pancreatitis, no acute changes -- CTAP 04/27/2023: Chronic pancreatitis, no acute changes, small hiatal hernia   Endoscopic Studies     EGD 02/06/2023  Path:   Clinical Impression   It is my clinical impression that Lori [name] is a 53 year old womanwith history of diabetes type 2 with gastroparesis, coronary disease status post CABG, chronic diastolic CHF, OSA on CPAP, CVA with residual left-sided weakness, wheelchair-bound, history of DVT on Xarelto, history of PE, hypothyroidism, frequent hospitalization for gastroparesis admitted with symptoms of intractable nausea, vomiting for which GI is consulted.  She has had an extensive previous evaluation for her symptoms including CTAP x 2, gastric emptying scan, EGD, brain MRI and laboratory tests.  The only pertinent findings on these have included radiographic evidence of chronic pancreatitis as well as delayed  gastric emptying.  No other anatomic abnormalities, intracranial abnormalities, thyroid dysfunction or adrenal insufficiency.  At this juncture, I surmise that her current symptoms are an exacerbation of her underlying gastroparesis and do not feel that additional diagnostics are necessarily warranted at this time.  With regard to her current management, I would recommend optimizing antacid therapy as well as promotility agents in the form of Reglan.  She is also endorsing symptoms of recent constipation without a bowel movement for 4 days which may contribute to worsening foregut dysmotility.  Instituting a bowel regimen would be indicated.  If she is unable to tolerate an oral bowel regimen stimulating bowel motility from below with  suppositories and/or enemas may be beneficial.   Plan  Increase Reglan from 5 mg IV every 6 hours to 10 mg IV every 6 hours Continue pantoprazole 40 mg orally daily If symptoms are not improving can consider the addition of a scopolamine patch 1.5 mg transdermally every 72 hours Consider trial of Carafate slurry 1 g p.o. 4 times daily for symptoms of throat discomfort/odynophagia Recommend instituting bowel regimen with MiraLAX, senna or Dulcolax.  If her nausea and vomiting preclude administering laxative agents from above can consider starting with suppository or enema. For history of anemia consider updating labs -iron, ferritin, TIBC  Thank you for your kind consultation, we will continue to follow.  Dr. Leone Payor will assume rounding responsibilities 05/12/2023  Lori [name]  05/11/2023, 3:40 PM  Maren Beach, MD [facility]

## 2023-05-11 NOTE — Progress Notes (Signed)
 PROGRESS NOTE  Lori [name]  RUE:[id number] DOB: Sep 27, 1970 DOA: 05/09/2023 PCP: Caesar Bookman, NP   Brief Narrative: Patient is a 53 year old female with history of diabetes type 2 with gastroparesis, coronary disease status post CABG, chronic diastolic CHF, OSA on CPAP, CVA with residual left-sided weakness, wheelchair-bound, history of DVT on Xarelto, history of PE, hypothyroidism, frequent hospitalization for gastroparesis who presented with symptoms of intractable nausea, vomiting.  Patient was recently admitted and was discharged at the end of the March when she presented with diabetic ketoacidosis.  Found to have DKA.  Treated with IV insulin.  Continues to have severe gastroparesis, GERD symptoms.  GI consulted today.  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  Was started on insulin drip, now on long-acting and sliding scale  History of diabetes gastroparesis/severe GERD: Chronic problem.  Continue Reglan for now, IV fluid, Protonix.  She was following with [facility].  We consulted [facility].She  complaints of some dysphagia symptoms as well  History of stroke with residual left hemiplegia, left upper extremity contractures: Patient is bedbound, wheelchair dependent at home.  On her last admission, she was recommended SNF on discharge.  She wanted to go home.  Will do repeat physical therapy evaluation when she is clinically  ready  Leukocytosis/sinus tachycardia.HTN: This was most likely secondary to dehydration, volume depletion from DKA.  Low suspicion for acute infectious process.  Urinalysis doesnot suggest UTI.  Continue gentle IV fluids. Home medications metoprolol restarted.  History of hyperthyroidism: On methimazole, checking TSH  History of hyperlipidemia: On Lipitor, Zetia  History of DVT: On Xarelto  History of depression/anxiety: On Lexapro  History of sleep apnea: On CPAP  History of coronary artery disease: Status post CABG.  No chest pain.  On Plavix.  On metoprolol, Ranexa  Denies any chest pain       DVT prophylaxis:rivaroxaban (XARELTO) tablet 10 mg Start: 05/10/23 1115 rivaroxaban (XARELTO) tablet 10 mg     Code Status: Full Code  Family Communication: called husband Mr Kern Alberta on phone for update on 4/6  Patient status:Inpatient  Patient is from :Home  Anticipated discharge JY:NWGN  Estimated DC date:1-2 days   Consultants: GI  Procedures:None  Antimicrobials:  Anti-infectives (From admission, onward)    None       Subjective: Patient seen and examined at bedside today.  Hemodynamically stable.  She looks better today.  She feels better.  Still having some discomfort on the throat and gagged while taking pills.  Denies any abdominal pain today.  Mentation is better.  Alert and oriented but has severe dysarthria.  Objective: Vitals:   05/11/23 0738 05/11/23 0800 05/11/23 0828 05/11/23 0900  BP:  (!) 156/94 (!) 152/99 (!) 128/50  Pulse:  (!) 124 (!) 134 (!) 122  Resp:  14  18  Temp: 98.5 F (36.9 C)     TempSrc: Oral     SpO2:  93%  94%  Weight:      Height:  Intake/Output Summary (Last 24 hours) at 05/11/2023 1024 Last data filed at 05/11/2023 0800 Gross per 24 hour  Intake 3213.36 ml  Output 1350 ml  Net 1863.36 ml   Filed Weights   05/09/23 1846 05/10/23 0849  Weight: 83.9 kg 77.5 kg    Examination:   General exam: Overall  comfortable, not in distress, chronically deconditioned HEENT: PERRL Respiratory system:  no wheezes or crackles  Cardiovascular system: Sinus tachycardia Gastrointestinal system: Abdomen is nondistended, soft and nontender. Central nervous system: Alert and oriented, dysarthria, left hemiplegia, contracture of left upper extremity Extremities: No edema, no clubbing ,no cyanosis Skin: No rashes, no ulcers,no icterus     Data Reviewed: I have personally reviewed following labs and imaging studies  CBC: Recent Labs  Lab 05/08/23 1434 05/09/23 1948 05/10/23 0603 05/11/23 0136 05/11/23 0851  WBC 14.9* 20.1* 19.8* 13.0* 12.9*  NEUTROABS  --  15.8*  --   --   --   HGB 13.2 13.4 13.0 9.0* 10.2*  HCT 39.6 41.5 39.6 27.5* 30.6*  MCV 78.6* 81.2 80.5 81.4 79.3*  PLT 473* 494* 490* 367 390   Basic Metabolic Panel: Recent Labs  Lab 05/08/23 1434 05/09/23 1948 05/10/23 0603 05/10/23 1840 05/11/23 0851  NA 135 133* 134* 140 135  K 3.7 4.8 3.8 4.0 2.9*  CL 98 97* 97* 106 102  CO2 24 23 20* 21* 22  GLUCOSE 319* 335* 439* 173* 204*  BUN 17 19 20 17 10   CREATININE 0.81 1.04* 1.04* 0.82 0.72  CALCIUM 9.7 9.5 9.5 8.9 8.4*  MG  --   --   --   --  1.4*     No results found for this or any previous visit (from the past 240 hours).   Radiology Studies: DG CHEST PORT 1 VIEW Result Date: 05/10/2023 CLINICAL DATA:  Status post PICC placement. EXAM: PORTABLE CHEST 1 VIEW COMPARISON:  Earlier radiograph dated 05/10/2023. FINDINGS: Right-sided PICC with tip close to the cavoatrial junction. No focal consolidation, pleural effusion or pneumothorax. The cardiac silhouette is within limits. Median sternotomy wires and CABG vascular clips. No acute osseous pathology. IMPRESSION: Right-sided PICC with tip close to the cavoatrial junction. Electronically Signed   By: Elgie Collard M.D.   On: 05/10/2023 20:09   [place] EKG Site Rite Result Date: 05/10/2023 If Site Rite image not attached, placement could  not be confirmed due to current cardiac rhythm.  DG CHEST PORT 1 VIEW Result Date: 05/10/2023 CLINICAL DATA:  PICC line placement EXAM: PORTABLE CHEST 1 VIEW COMPARISON:  04/27/2023 FINDINGS: Sternotomy. No acute airspace disease, pleural effusion, or pneumothorax. A right azygous lobe/fissure is noted. Right upper extremity central venous catheter tip with abnormal orientation, curved and directed superiorly in the region of the right azygos fissure. IMPRESSION: Right upper extremity central venous catheter tip with abnormal orientation, curved and directed superiorly in the region of the right azygos fissure. Recommend repositioning. These results will be called to the ordering clinician or representative by the Radiologist Assistant, and communication documented in the PACS or [employer]. Electronically Signed   By: Jasmine Pang M.D.   On: 05/10/2023 18:02   [place] EKG SITE RITE Result Date: 05/10/2023 If Site Rite image not attached, placement could not be confirmed due to current cardiac rhythm.   Scheduled Meds:  atorvastatin  80 mg Oral Daily   baclofen  10 mg Oral TID   Chlorhexidine Gluconate Cloth  6 each Topical Daily   clopidogrel  75 mg Oral Daily  ezetimibe  10 mg Oral Daily   gabapentin  100 mg Oral TID   irbesartan  300 mg Oral Daily   And   hydrochlorothiazide  25 mg Oral Daily   insulin aspart  0-9 Units Subcutaneous TID WC   insulin glargine-yfgn  14 Units Subcutaneous Daily   methimazole  5 mg Oral q morning   metoCLOPramide (REGLAN) injection  5 mg Intravenous Q6H   metoprolol tartrate  100 mg Oral BID   pantoprazole (PROTONIX) IV  40 mg Intravenous Q24H   potassium chloride  40 mEq Oral Once   ranolazine  500 mg Oral BID   rivaroxaban  10 mg Oral Daily   sodium chloride flush  10-40 mL Intracatheter Q12H   Continuous Infusions:  potassium chloride 10 mEq (05/11/23 0948)     LOS: 1 day   Burnadette Pop, MD [facility] P4/07/2023, 10:24 AM

## 2023-05-11 NOTE — Progress Notes (Signed)
   05/11/23 1923  Provider Notification  Provider Name/Title Dr. Renford Dills  Date Provider Notified 05/11/23  Time Provider Notified 1924  Method of Notification Page  Notification Reason Red med refusal  Provider response See new orders  Date of Provider Response 05/11/23  Time of Provider Response 1923   Refused multiple meds during shift including gabapentin, baclofen and Avapro.

## 2023-05-11 NOTE — Plan of Care (Signed)
  Problem: Metabolic: Goal: Ability to maintain appropriate glucose levels will improve Outcome: Progressing   Problem: Nutritional: Goal: Maintenance of adequate nutrition will improve Outcome: Progressing   Problem: Tissue Perfusion: Goal: Adequacy of tissue perfusion will improve Outcome: Progressing   Problem: Cardiac: Goal: Ability to maintain an adequate cardiac output will improve Outcome: Progressing   Problem: Fluid Volume: Goal: Ability to achieve a balanced intake and output will improve Outcome: Progressing   Problem: Metabolic: Goal: Ability to maintain appropriate glucose levels will improve Outcome: Progressing   Problem: Nutritional: Goal: Maintenance of adequate nutrition will improve Outcome: Progressing

## 2023-05-11 NOTE — Progress Notes (Signed)
 Notified Dr. Renford Dills that patient's potassium is 2.9, MD acknowledged and placed orders for potassium replacement.

## 2023-05-11 NOTE — Plan of Care (Signed)
 Patient understands and is able to teach back plan of care

## 2023-05-11 NOTE — Plan of Care (Signed)
 Will discuss when patient wakes up medications, pain management and plan of care for the evening unknown teach back level at this time.  Dicussed with MD and will try to give nausea medications before pain and muscle relaxants tonight.  Holding off on any medications and treatments at this time to promote rest.  Problem: Education: Goal: Knowledge of General Education information will improve Description: Including pain rating scale, medication(s)/side effects and non-pharmacologic comfort measures Outcome: Not Progressing   Problem: Health Behavior/Discharge Planning: Goal: Ability to manage health-related needs will improve Outcome: Not Progressing

## 2023-05-12 DIAGNOSIS — E1143 Type 2 diabetes mellitus with diabetic autonomic (poly)neuropathy: Secondary | ICD-10-CM | POA: Diagnosis not present

## 2023-05-12 DIAGNOSIS — K3184 Gastroparesis: Secondary | ICD-10-CM | POA: Diagnosis not present

## 2023-05-12 DIAGNOSIS — K861 Other chronic pancreatitis: Secondary | ICD-10-CM

## 2023-05-12 LAB — BASIC METABOLIC PANEL WITH GFR
Anion gap: 7 (ref 5–15)
BUN: 7 mg/dL (ref 6–20)
CO2: 22 mmol/L (ref 22–32)
Calcium: 8 mg/dL — ABNORMAL LOW (ref 8.9–10.3)
Chloride: 105 mmol/L (ref 98–111)
Creatinine, Ser: 0.69 mg/dL (ref 0.44–1.00)
GFR, Estimated: >60 mL/min (ref >60)
Glucose, Bld: 144 mg/dL — ABNORMAL HIGH (ref 70–99)
Potassium: 3.7 mmol/L (ref 3.5–5.1)
Sodium: 134 mmol/L — ABNORMAL LOW (ref 135–145)

## 2023-05-12 LAB — IRON AND TIBC
Iron: 14 ug/dL — ABNORMAL LOW (ref 28–170)
Saturation Ratios: 6 % — ABNORMAL LOW (ref 10.4–31.8)
TIBC: 241 ug/dL — ABNORMAL LOW (ref 250–450)
UIBC: 227 ug/dL

## 2023-05-12 LAB — CBC
HCT: 25.8 % — ABNORMAL LOW (ref 36.0–46.0)
Hemoglobin: 8.7 g/dL — ABNORMAL LOW (ref 12.0–15.0)
MCH: 27.4 pg (ref 26.0–34.0)
MCHC: 33.7 g/dL (ref 30.0–36.0)
MCV: 81.1 fL (ref 80.0–100.0)
Platelets: 301 10*3/uL (ref 150–400)
RBC: 3.18 MIL/uL — ABNORMAL LOW (ref 3.87–5.11)
RDW: 15.9 % — ABNORMAL HIGH (ref 11.5–15.5)
WBC: 9.3 10*3/uL (ref 4.0–10.5)
nRBC: 0 % (ref 0.0–0.2)

## 2023-05-12 LAB — FERRITIN: Ferritin: 31 ng/mL (ref 11–307)

## 2023-05-12 LAB — GLUCOSE, CAPILLARY
Glucose-Capillary: 158 mg/dL — ABNORMAL HIGH (ref 70–99)
Glucose-Capillary: 178 mg/dL — ABNORMAL HIGH (ref 70–99)
Glucose-Capillary: 164 mg/dL — ABNORMAL HIGH (ref 70–99)
Glucose-Capillary: 174 mg/dL — ABNORMAL HIGH (ref 70–99)

## 2023-05-12 MED ORDER — ACETAMINOPHEN 325 MG PO TABS
650.0000 mg | ORAL_TABLET | Freq: Four times a day (QID) | ORAL | Status: DC | PRN
  Administered 2023-05-12: 650 mg via ORAL
  Filled 2023-05-12: qty 2

## 2023-05-12 MED ORDER — ENSURE ENLIVE PO LIQD
237.0000 mL | Freq: Two times a day (BID) | ORAL | Status: DC
  Administered 2023-05-13: 237 mL via ORAL

## 2023-05-12 MED ORDER — SENNOSIDES-DOCUSATE SODIUM 8.6-50 MG PO TABS
2.0000 | ORAL_TABLET | Freq: Once | ORAL | Status: AC
Start: 2023-05-12 — End: 2023-05-12
  Administered 2023-05-12: 2 via ORAL
  Filled 2023-05-12: qty 2

## 2023-05-12 MED ORDER — DIPHENHYDRAMINE HCL 50 MG/ML IJ SOLN
25.0000 mg | Freq: Four times a day (QID) | INTRAMUSCULAR | Status: DC | PRN
  Administered 2023-05-12: 25 mg via INTRAVENOUS
  Filled 2023-05-12: qty 1

## 2023-05-12 MED ORDER — POLYETHYLENE GLYCOL 3350 17 G PO PACK
17.0000 g | PACK | Freq: Every day | ORAL | Status: DC
  Filled 2023-05-12: qty 1

## 2023-05-12 MED ORDER — POLYETHYLENE GLYCOL 3350 17 G PO PACK: 17.0000 g | PACK | Freq: Every day | ORAL | Status: DC

## 2023-05-12 MED ORDER — METOPROLOL TARTRATE 25 MG PO TABS
50.0000 mg | ORAL_TABLET | Freq: Two times a day (BID) | ORAL | Status: DC
  Administered 2023-05-13: 50 mg via ORAL
  Filled 2023-05-12: qty 2

## 2023-05-12 NOTE — Inpatient Diabetes Management (Signed)
 Inpatient Diabetes Program Recommendations  AACE/ADA: New Consensus Statement on Inpatient Glycemic Control (2015)  Target Ranges:  Prepandial:   less than 140 mg/dL      Peak postprandial:   less than 180 mg/dL (1-2 hours)      Critically ill patients:  140 - 180 mg/dL   Lab Results  Component Value Date   GLUCAP 178 (H) 05/12/2023   HGBA1C 10.0 (H) 04/25/2023    Review of Glycemic Control  Latest Reference Range & Units 05/11/23 08:34 05/11/23 09:35 05/11/23 10:39 05/11/23 11:44 05/11/23 16:23 05/11/23 21:54 05/12/23 08:14 05/12/23 11:54  Glucose-Capillary 70 - 99 mg/dL 161 (H) 096 (H) 045 (H) 210 (H) 180 (H) 170 (H) 158 (H) 178 (H)   Diabetes history: DM 2 Outpatient Diabetes medications: Humalog 4 units tid meal coverage + SSI 0-15 units tid, Lantus 10 units qhs, PCP prescribed Ozempic pt got it but didn't start it Current orders for Inpatient glycemic control:  Novolog 0-9 units tid Semglee 14 units Daily  A1c 10% on 3/21  Pt reports living at home with her husband and he helps with her medication. She takes her medication everyday without missing doses and have noticed an increase in her glucose trends even though she was not eating and feeling sick to her stomach. I discussed current A1c level of 10%. Reviewed glucose and A1c goals. Encouraged pt contact PCP regarding higher glucose trends for adjustments at home.  Inpatient Diabetes Program Recommendations:    -   at time of d/c please indicate that Ozempic would not be recommended due to pt having history of gastroparesis.  Thanks,  Christena Deem RN, MSN, BC-ADM Inpatient Diabetes Coordinator Team Pager [phone] (8a-5p)

## 2023-05-12 NOTE — Telephone Encounter (Signed)
 Nolene Bernheim, RN  [facility]3 days ago    Lori [name]- patient going to ED now     Forwarded to [employer] for [employer]

## 2023-05-12 NOTE — Evaluation (Signed)
 Physical Therapy Evaluation Patient Details Name: Novalie Leamy Neclos-Becton MRN: [id number] DOB: 1971-01-25 Today's Date: 05/12/2023  History of Present Illness  53 year old female who presented with symptoms of intractable nausea, vomiting. with history of diabetes type 2 with gastroparesis, coronary disease status post CABG, chronic diastolic CHF, OSA on CPAP, CVA with residual left-sided weakness, wheelchair-bound, history of DVT on Xarelto, history of PE, hypothyroidism, frequent hospitalization for gastroparesis.  Clinical Impression  Pt admitted with above diagnosis. +2 total assist for bed mobility, min to mod assist for static sitting balance at edge of bed 2* R lateral lean. Pt sat edge of bed for ~8 minutes. Performed rolling L and R several times for pericare and change of bed pad as bed was saturated in urine.  Pt currently with functional limitations due to the deficits listed below (see PT Problem List). Pt will benefit from acute skilled PT to increase their independence and safety with mobility to allow discharge.           If plan is discharge home, recommend the following: Two people to help with walking and/or transfers;Two people to help with bathing/dressing/bathroom;Assistance with cooking/housework;Assist for transportation;Help with stairs or ramp for entrance;Direct supervision/assist for medications management   Can travel by private vehicle   No    Equipment Recommendations Hoyer lift  Recommendations for Other Services       Functional Status Assessment Patient has had a recent decline in their functional status and demonstrates the ability to make significant improvements in function in a reasonable and predictable amount of time.     Precautions / Restrictions Precautions Precautions: Fall Recall of Precautions/Restrictions: Intact Restrictions Weight Bearing Restrictions Per Provider Order: No      Mobility  Bed Mobility Overal bed mobility:  Needs Assistance Bed Mobility: Rolling, Sidelying to Sit Rolling: Total assist Sidelying to sit: Total assist, +2 for physical assistance       General bed mobility comments: pt sat edge of bed for ~8 minutes with min/mod assist for R lateral lean, tolerance limited by fatigue; pt rolled L and R for pericare as bed was saturated in urine, changed gown and placed dry pad, nurses aware of need for linen change    Transfers                        Ambulation/Gait                  Stairs            Wheelchair Mobility     Tilt Bed    Modified Rankin (Stroke Patients Only)       Balance Overall balance assessment: Needs assistance Sitting-balance support: Feet supported, Bilateral upper extremity supported Sitting balance-Leahy Scale: Poor Sitting balance - Comments: min/mod assist for static balance Postural control: Right lateral lean                                   Pertinent Vitals/Pain Pain Assessment Pain Assessment: No/denies pain Faces Pain Scale: No hurt    Home Living Family/patient expects to be discharged to:: Unsure Living Arrangements: Spouse/significant other Available Help at Discharge: Family;Available PRN/intermittently;Personal care attendant Type of Home: Apartment Home Access: Level entry       Home Layout: One level Home Equipment: Hospital bed;Wheelchair - manual;Tub bench Additional Comments: has home health aide 3 hours/day    Prior Function Prior Level  of Function : Needs assist;Patient poor historian/Family not available  Cognitive Assist : Mobility (cognitive);ADLs (cognitive)     Physical Assist : ADLs (physical);Mobility (physical) Mobility (physical): Bed mobility;Transfers ADLs (physical): Grooming;Bathing;Dressing;Toileting;IADLs Mobility Comments: Pt reports standing and pivoting to and from wheelchair with assist ADLs Comments: assist needed     Extremity/Trunk Assessment   Upper  Extremity Assessment Upper Extremity Assessment: Defer to OT evaluation LUE Deficits / Details: residual weakness and L hand contracture from previous stroke. Pt keeps LUE adducted and reports she has no functional use of LUE.    Lower Extremity Assessment Lower Extremity Assessment: Generalized weakness RLE Deficits / Details: limited assessment 2* pt fatigue LLE Deficits / Details: Hx residual deficits from stroke, increased L foot inversion, limited assessment 2* pt fatigue    Cervical / Trunk Assessment Cervical / Trunk Assessment: Kyphotic  Communication   Communication Communication: Impaired Factors Affecting Communication: Reduced clarity of speech    Cognition Arousal: Alert Behavior During Therapy: WFL for tasks assessed/performed   PT - Cognitive impairments: No apparent impairments                         Following commands: Intact Following commands impaired: Only follows one step commands consistently     Cueing Cueing Techniques: Verbal cues, Gestural cues, Tactile cues     General Comments      Exercises     Assessment/Plan    PT Assessment Patient needs continued PT services  PT Problem List Decreased strength;Decreased range of motion;Decreased activity tolerance;Decreased balance;Decreased mobility;Decreased coordination;Decreased cognition;Impaired tone;Obesity       PT Treatment Interventions DME instruction;Gait training;Stair training;Functional mobility training;Therapeutic activities;Therapeutic exercise;Balance training;Neuromuscular re-education;Cognitive remediation    PT Goals (Current goals can be found in the Care Plan section)  Acute Rehab PT Goals Patient Stated Goal: Wants to return home and Walk PT Goal Formulation: Patient unable to participate in goal setting Time For Goal Achievement: 05/26/23 Potential to Achieve Goals: Fair    Frequency Min 2X/week     Co-evaluation               AM-PAC PT "6 Clicks"  Mobility  Outcome Measure Help needed turning from your back to your side while in a flat bed without using bedrails?: Total Help needed moving from lying on your back to sitting on the side of a flat bed without using bedrails?: Total Help needed moving to and from a bed to a chair (including a wheelchair)?: Total Help needed standing up from a chair using your arms (e.g., wheelchair or bedside chair)?: Total Help needed to walk in hospital room?: Total Help needed climbing 3-5 steps with a railing? : Total 6 Click Score: 6    End of Session   Activity Tolerance: Patient limited by fatigue Patient left: in bed;with bed alarm set;with call bell/phone within reach Nurse Communication: Mobility status;Need for lift equipment PT Visit Diagnosis: Other abnormalities of gait and mobility (R26.89);Muscle weakness (generalized) (M62.81);History of falling (Z91.81);Difficulty in walking, not elsewhere classified (R26.2);Other symptoms and signs involving the nervous system (R29.898);Hemiplegia and hemiparesis;Pain Hemiplegia - Right/Left: Left Hemiplegia - caused by: Unspecified    Time: 1610-9604 PT Time Calculation (min) (ACUTE ONLY): 24 min   Charges:   PT Evaluation $PT Eval Moderate Complexity: 1 Mod PT Treatments $Therapeutic Activity: 8-22 mins PT General Charges $$ ACUTE PT VISIT: 1 Visit         Ralene Bathe Kistler PT 05/12/2023  Acute Rehabilitation Services  Office [phone]

## 2023-05-12 NOTE — Progress Notes (Signed)
 Patient rested until about 2215 upon awakening she wanted to be cleaned up.  She did like the approach of nausea medication before her night time medications.  She was able to swallow them whole in applesauce and her potato soup.   She was agreeable to turn and change her pure wick with her bath and CHG.   Will have to watch for interaction with CHG due to her redness in her chest which she scratching later.  She was able to let us perform task if we took it slow.  Later asked for anxiety medication due restlessness, sleep and mild anxiety.  Blood pressure dropped but MAP>65 made on call MD aware.

## 2023-05-12 NOTE — TOC Initial Note (Signed)
 Transition of Care [facility]) - Initial/Assessment Note    Patient Details  Name: Lori [name] MRN: [id number] Date of Birth: 1970-03-19  Transition of Care [facility]) CM/SW Contact:    Larrie Kass, LCSW Phone Number: 05/12/2023, 3:37 PM  Clinical Narrative:                 CSW met with the pt, who is alert and oriented, to discuss recommendations for SNF placement. Pt has agreed with no preference for a facility. CSW explained the process, noting that the pt will need insurance authorization. Pt did not want CSW to contact her spouse to provide an update. CSW will fax the pt's information for SNF placement. TOC to follow.  Expected Discharge Plan: Skilled Nursing Facility Barriers to Discharge: Continued Medical Work up   Patient Goals and CMS Choice Patient states their goals for this hospitalization and ongoing recovery are:: SNF          Expected Discharge Plan and Services       Living arrangements for the past 2 months: Apartment                                      Prior Living Arrangements/Services Living arrangements for the past 2 months: Apartment Lives with:: Self, Spouse Patient language and need for interpreter reviewed:: Yes Do you feel safe going back to the place where you live?: Yes      Need for Family Participation in Patient Care: No (Comment) Care giver support system in place?: No (comment) Current home services: DME, Homehealth aide Criminal Activity/Legal Involvement Pertinent to Current Situation/Hospitalization: No - Comment as needed  Activities of Daily Living   ADL Screening (condition at time of admission) Independently performs ADLs?: No Does the patient have a NEW difficulty with bathing/dressing/toileting/self-feeding that is expected to last >3 days?: No Does the patient have a NEW difficulty with getting in/out of bed, walking, or climbing stairs that is expected to last >3 days?: No Does the patient  have a NEW difficulty with communication that is expected to last >3 days?: No Is the patient deaf or have difficulty hearing?: No Does the patient have difficulty seeing, even when wearing glasses/contacts?: No Does the patient have difficulty concentrating, remembering, or making decisions?: Yes  Permission Sought/Granted                  Emotional Assessment Appearance:: Appears stated age Attitude/Demeanor/Rapport: Gracious Affect (typically observed): Accepting Orientation: : Oriented to Self, Oriented to Situation, Oriented to Place      Admission diagnosis:  Nausea [R11.0] Diabetic gastroparesis (HCC) [E11.43, K31.84] Nausea and vomiting, unspecified vomiting type [R11.2] Patient Active Problem List   Diagnosis Date Noted   Nausea 05/11/2023   Acute renal failure (ARF) (HCC) 04/27/2023   Gastrointestinal hemorrhage 02/13/2023   Anxiety and depression 02/13/2023   Insulin dependent type 2 diabetes mellitus (HCC) 02/13/2023   Hypomagnesemia 02/06/2023   Metabolic acidosis 02/06/2023   Acute esophagitis 02/06/2023   Adenomatous duodenal polyp 02/06/2023   Gastric polyp 02/06/2023   COVID-19 virus infection 02/05/2023   Hematemesis 02/04/2023   Anticoagulated 02/04/2023   Urinary incontinence 01/22/2023   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   Diabetic hyperosmolar non-ketotic state (HCC) 01/19/2023   Cerebrovascular disease 01/03/2023   Hypokalemia 01/02/2023   Thrombocytosis 01/02/2023   Microcytic anemia 01/02/2023   OSA on CPAP 01/02/2023   Acute  cystitis 12/26/2022   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 12/25/2022   History of DVT (deep vein thrombosis) 12/25/2022   S/P total hysterectomy 12/24/2022   Delirium 11/22/2022   Acute encephalopathy 11/04/2022   Antiplatelet or antithrombotic long-term use 11/04/2022   Hypotension 11/04/2022   Acute metabolic encephalopathy 11/04/2022   Morbid obesity (HCC) 10/13/2022   Aspiration pneumonia (HCC) 10/13/2022    Chronic diastolic CHF (congestive heart failure) (HCC) 10/13/2022   Chronic anemia 10/13/2022   Acute pancreatitis 10/11/2022   Chest pain of uncertain etiology 10/06/2022   Coronary artery disease with chronic angina 10/06/2022   Generalized anxiety disorder 10/06/2022   Reactive airway disease 10/06/2022   UTI (urinary tract infection) 07/04/2022   Mild intermittent asthma, uncomplicated 07/04/2022   Lethargic 05/01/2022   Hyponatremia 04/30/2022   Hyperkalemia 04/30/2022   AKI (acute kidney injury) (HCC) 04/30/2022   Wheelchair dependence 04/08/2022   Spasticity as late effect of cerebrovascular accident (CVA) 04/08/2022   Left hemiplegia (HCC) 04/08/2022   Dyspnea 11/02/2021   GERD (gastroesophageal reflux disease) 11/02/2021   Nausea and vomiting 08/25/2021   Acute bronchitis 08/24/2021   Hypothyroidism 07/10/2021   Functional urinary incontinence 05/17/2021   MDD (major depressive disorder), recurrent episode, severe (HCC) 04/09/2021   Diabetic gastroparesis (HCC) 03/16/2021   Migraines 03/16/2021   History of CVA with residual deficit    Debility 02/26/2021   DKA (diabetic ketoacidosis) (HCC) 02/19/2021   Constipation    Weakness    Type 2 diabetes mellitus with hyperlipidemia (HCC)    Aphasia    Spell of abnormal behavior    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 01/19/2021   Tachycardia 01/19/2021   CAD (coronary artery disease) 01/19/2021   Spastic hemiparesis of left 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 (HCC) 01/19/2021   Hyperthyroidism    Essential hypertension    Pheochromocytoma    Depression    Hyperlipidemia    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 [place] [address] Phone: [phone] Fax: [phone]  [facility] - [place], [place] - [address] [place] [address] Phone: [phone] Fax: [phone]  [facility] - [place], [place] - [address] [place] [address] Phone: [phone] Fax: [phone]     Social Drivers of Health (SDOH) Social History: SDOH Screenings   Food Insecurity: No Food Insecurity (05/09/2023)  Housing: Low Risk  (05/09/2023)  Transportation Needs: No Transportation Needs (05/09/2023)  Utilities: Not At Risk (05/09/2023)  Depression (PHQ2-9): Low Risk  (04/03/2023)  Tobacco Use: Low Risk  (05/09/2023)   SDOH Interventions:     Readmission Risk Interventions    05/01/2023    3:01 PM 12/28/2022   11:30 AM 11/05/2022    1:38 PM  Readmission Risk Prevention Plan  Transportation Screening Complete Complete Complete  Medication Review (RN Care Manager) Complete Complete Complete  PCP or Specialist  appointment within 3-5 days of discharge Complete Complete Complete  HRI or Home Care Consult Complete Complete Complete  SW Recovery Care/Counseling Consult Complete Complete Complete  Palliative Care Screening Not Applicable Not Applicable Not Applicable  Skilled Nursing Facility Patient Refused Not Applicable Not Applicable

## 2023-05-12 NOTE — Progress Notes (Addendum)
 [facility] Progress Note  CC:  GERD, dysphagia, gastroparesis   Subjective:  Per her nurse, she tolerated soft diet and did not need prn antiemetics today.  Patient tells me that she has not pooped in 4 days.  Objective:  Vital signs in last 24 hours: Temp:  [98.1 F (36.7 C)-101.1 F (38.4 C)] 101.1 F (38.4 C) (04/07 1205) Pulse Rate:  [80-119] 119 (04/07 1100) Resp:  [15-28] 21 (04/07 1100) BP: (75-145)/(54-103) 103/69 (04/07 1100) SpO2:  [91 %-97 %] 96 % (04/07 1100) Last BM Date :  (PTA) General:  Alert, chronically ill-appearing, in NAD. Heart:  Regular rate and rhythm; no murmurs Pulm:  CTAB.  No W/R/R. Abdomen:  Soft, non-distended.  BS present.  Non-tender. Extremities:  Without edema.  Intake/Output from previous day: 04/06 0701 - 04/07 0700 In: 4564.9 [P.O.:240; I.V.:3682.8; IV Piggyback:642.1] Out: 2250 [Urine:2250] Intake/Output this shift: Total I/O In: 336.4 [I.V.:336.4] Out: -   Lab Results: Recent Labs    05/11/23 0136 05/11/23 0851 05/12/23 0529  WBC 13.0* 12.9* 9.3  HGB 9.0* 10.2* 8.7*  HCT 27.5* 30.6* 25.8*  PLT 367 390 301   BMET Recent Labs    05/10/23 1840 05/11/23 0851 05/12/23 0529  NA 140 135 134*  K 4.0 2.9* 3.7  CL 106 102 105  CO2 21* 22 22  GLUCOSE 173* 204* 144*  BUN 17 10 7   CREATININE 0.82 0.72 0.69  CALCIUM 8.9 8.4* 8.0*   LFT Recent Labs    05/10/23 0603  PROT 8.2*  ALBUMIN 3.6  AST 16  ALT 22  ALKPHOS 81  BILITOT 0.8   DG CHEST PORT 1 VIEW Result Date: 05/10/2023 CLINICAL DATA:  Status post PICC placement. EXAM: PORTABLE CHEST 1 VIEW COMPARISON:  Earlier radiograph dated 05/10/2023. FINDINGS: Right-sided PICC with tip close to the cavoatrial junction. No focal consolidation, pleural effusion or pneumothorax. The cardiac silhouette is within limits. Median sternotomy wires and CABG vascular clips. No acute osseous pathology. IMPRESSION: Right-sided PICC with tip close to the cavoatrial  junction. Electronically Signed   By: Elgie Collard M.D.   On: 05/10/2023 20:09   [place] EKG Site Rite Result Date: 05/10/2023 If Site Rite image not attached, placement could not be confirmed due to current cardiac rhythm.  DG CHEST PORT 1 VIEW Result Date: 05/10/2023 CLINICAL DATA:  PICC line placement EXAM: PORTABLE CHEST 1 VIEW COMPARISON:  04/27/2023 FINDINGS: Sternotomy. No acute airspace disease, pleural effusion, or pneumothorax. A right azygous lobe/fissure is noted. Right upper extremity central venous catheter tip with abnormal orientation, curved and directed superiorly in the region of the right azygos fissure. IMPRESSION: Right upper extremity central venous catheter tip with abnormal orientation, curved and directed superiorly in the region of the right azygos fissure. Recommend repositioning. These results will be called to the ordering clinician or representative by the Radiologist Assistant, and communication documented in the PACS or [employer]. Electronically Signed   By: Jasmine Pang M.D.   On: 05/10/2023 18:02   Assessment / Plan: 53 year old woman with history of diabetes type 2 with gastroparesis, coronary disease status post CABG, chronic diastolic CHF, OSA on CPAP, CVA with residual left-sided weakness, wheelchair-bound, history of DVT on Xarelto, history of PE, hypothyroidism, frequent hospitalization for gastroparesis admitted with symptoms of intractable nausea, vomiting for which GI is consulted.   She has had an extensive previous evaluation for her symptoms including CTAP x 2, gastric emptying scan, EGD, brain MRI and laboratory tests.  The  only pertinent findings on these have included radiographic evidence of chronic pancreatitis as well as delayed gastric emptying.  No other anatomic abnormalities, intracranial abnormalities, thyroid dysfunction or adrenal insufficiency.  At this juncture, I surmise that her current symptoms are an exacerbation of her underlying  gastroparesis and do not feel that additional diagnostics are necessarily warranted at this time.  *DKA:  Reason for ICU/stepdown admission.   -Reglan changed from 5 mg IV every 6 hours to 10 mg IV every 6 hours.  Will leave at this dose at it may be helping as she is doing better today. -Continue pantoprazole 40 mg orally daily. -Compazine and zofran are on her list prn. -If symptoms are not improving can consider the addition of a scopolamine patch 1.5 mg transdermally every 72 hours -Carafate slurry 1 g p.o. 4 times daily for symptoms of throat discomfort/odynophagia was added 4/6 as well. -Will give a one time dose of two Senokot-S and will order Miralax daily starting tomorrow.  Needs a good bowel regimen to prevent constipation from worsening her symptoms. -For history of anemia will update labs -iron, ferritin, TIBC   LOS: 2 days   Princella Pellegrini. Zehr  05/12/2023, 1:03 PM    [facility] Attending   I have taken an interval history, reviewed the chart and examined the patient. I agree with the Advanced Practitioner's note, impression and recommendations.  The patient is improving with current care - complicated situation with DKA and hx gastroparesis and numerous other co-morbidities. Have titrated up on Reglan dose.  Iva Boop, MD, [facility] See Loretha Stapler on call - gastroenterology for best contact person 05/12/2023 5:35 PM

## 2023-05-12 NOTE — Telephone Encounter (Signed)
 Noted.

## 2023-05-12 NOTE — Progress Notes (Signed)
 PROGRESS NOTE  Lori [name]  UEA:[id number] DOB: 1971/01/04 DOA: 05/09/2023 PCP: Caesar Bookman, NP   Brief Narrative: Patient is a 53 year old female with history of diabetes type 2 with gastroparesis, coronary disease status post CABG, chronic diastolic CHF, OSA on CPAP, CVA with residual left-sided weakness, wheelchair-bound, history of DVT on Xarelto, history of PE, hypothyroidism, frequent hospitalization for gastroparesis who presented with symptoms of intractable nausea, vomiting.  Patient was recently admitted and was discharged at the end of the March when she presented with diabetic ketoacidosis.  Found to have DKA.  Treated with IV insulin.  Continued  to have severe gastroparesis, GERD symptoms.  GI consulted .  Started on Carafate, increased the dose of Reglan.  Now clinically improving.  PT consulted today  Assessment & Plan:  Principal Problem:   Diabetic gastroparesis (HCC) Active Problems:   Generalized anxiety disorder   Hyperthyroidism   Essential hypertension   Type 2 diabetes mellitus with hyperlipidemia (HCC)   Coronary artery disease with chronic angina   Chronic diastolic CHF (congestive heart failure) (HCC)   OSA on CPAP   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   Depression   Hyperlipidemia   Constipation   GERD (gastroesophageal reflux disease)   Hyponatremia   Nausea   DKA: Presented with nausea, vomiting.  Was started on insulin drip, now on long-acting and sliding scale.  Monitor blood sugars  History of diabetes gastroparesis/severe GERD: Chronic problem.  Marland Kitchen  She was following with [facility].  We consulted [facility].She  complaints of some dysphagia symptoms as well.  No plan for EGD.  Now clinical improving.  Continue Carafate, Reglan  History of stroke with residual left hemiplegia, left upper extremity contractures: Patient is bedbound, wheelchair dependent at home.  On  her last admission, she was recommended SNF on discharge.  She wanted to go home.  Will do repeat physical therapy evaluation   Leukocytosis/sinus tachycardia.HTN: This was most likely secondary to dehydration, volume depletion from DKA.  Low suspicion for acute infectious process.  Urinalysis doesnot suggest UTI.  Leucocytosis resolved. Home medications metoprolol restarted.  History of hyperthyroidism: On methimazole, normal TSH  History of hyperlipidemia: On Lipitor, Zetia  History of DVT: On Xarelto  History of depression/anxiety: On Lexapro  History of sleep apnea: On CPAP  History of coronary artery disease: Status post CABG.  No chest pain.  On Plavix.  On metoprolol, Ranexa  Denies any chest pain       DVT prophylaxis:rivaroxaban (XARELTO) tablet 10 mg Start: 05/10/23 1115 rivaroxaban (XARELTO) tablet 10 mg     Code Status: Full Code  Family Communication: called husband Mr Kern Alberta on phone for update on 4/6  Patient status:Inpatient  Patient is from :Home  Anticipated discharge YN:WGNF  Estimated DC date:1-2 days   Consultants: GI  Procedures:None  Antimicrobials:  Anti-infectives (From admission, onward)    None       Subjective: Patient seen and examined at bedside today.  She feels better today.  No abdominal pain, nausea or vomiting.  Diet advanced to soft.  Sinus tachycardia improved.  Potassium level is normal today.  We discussed about physical therapy and moving forward to MedSurg floor  Objective: Vitals:   05/12/23 0600 05/12/23 0700 05/12/23 0730 05/12/23 0800  BP: 108/89 115/77    Pulse: 92 95 96   Resp: (!) 25 (!) 28 (!) 21   Temp:    98.8 F (37.1 C)  TempSrc:    Oral  SpO2: 94% 95% 93%   Weight:      Height:        Intake/Output Summary (Last 24 hours) at 05/12/2023 0935 Last data filed at 05/12/2023 0800 Gross per 24 hour  Intake 2639.68 ml  Output 1250 ml  Net 1389.68 ml   Filed Weights   05/09/23 1846 05/10/23 0849   Weight: 83.9 kg 77.5 kg    Examination:  General exam: Overall comfortable, not in distress, chronically deconditioned,weak,, obese HEENT: PERRL Respiratory system:  no wheezes or crackles  Cardiovascular system: S1 & S2 heard, RRR.  Gastrointestinal system: Abdomen is nondistended, soft and nontender. Central nervous system: Alert and oriented, dysarthria, left hemiplegia, contracture of left lower extremity Extremities: No edema, no clubbing ,no cyanosis Skin: No rashes, no ulcers,no icterus     Data Reviewed: I have personally reviewed following labs and imaging studies  CBC: Recent Labs  Lab 05/09/23 1948 05/10/23 0603 05/11/23 0136 05/11/23 0851 05/12/23 0529  WBC 20.1* 19.8* 13.0* 12.9* 9.3  NEUTROABS 15.8*  --   --   --   --   HGB 13.4 13.0 9.0* 10.2* 8.7*  HCT 41.5 39.6 27.5* 30.6* 25.8*  MCV 81.2 80.5 81.4 79.3* 81.1  PLT 494* 490* 367 390 301   Basic Metabolic Panel: Recent Labs  Lab 05/09/23 1948 05/10/23 0603 05/10/23 1840 05/11/23 0851 05/12/23 0529  NA 133* 134* 140 135 134*  K 4.8 3.8 4.0 2.9* 3.7  CL 97* 97* 106 102 105  CO2 23 20* 21* 22 22  GLUCOSE 335* 439* 173* 204* 144*  BUN 19 20 17 10 7   CREATININE 1.04* 1.04* 0.82 0.72 0.69  CALCIUM 9.5 9.5 8.9 8.4* 8.0*  MG  --   --   --  1.4*  --      No results found for this or any previous visit (from the past 240 hours).   Radiology Studies: DG CHEST PORT 1 VIEW Result Date: 05/10/2023 CLINICAL DATA:  Status post PICC placement. EXAM: PORTABLE CHEST 1 VIEW COMPARISON:  Earlier radiograph dated 05/10/2023. FINDINGS: Right-sided PICC with tip close to the cavoatrial junction. No focal consolidation, pleural effusion or pneumothorax. The cardiac silhouette is within limits. Median sternotomy wires and CABG vascular clips. No acute osseous pathology. IMPRESSION: Right-sided PICC with tip close to the cavoatrial junction. Electronically Signed   By: Elgie Collard M.D.   On: 05/10/2023 20:09   [place]  EKG Site Rite Result Date: 05/10/2023 If Site Rite image not attached, placement could not be confirmed due to current cardiac rhythm.  DG CHEST PORT 1 VIEW Result Date: 05/10/2023 CLINICAL DATA:  PICC line placement EXAM: PORTABLE CHEST 1 VIEW COMPARISON:  04/27/2023 FINDINGS: Sternotomy. No acute airspace disease, pleural effusion, or pneumothorax. A right azygous lobe/fissure is noted. Right upper extremity central venous catheter tip with abnormal orientation, curved and directed superiorly in the region of the right azygos fissure. IMPRESSION: Right upper extremity central venous catheter tip with abnormal orientation, curved and directed superiorly in the region of the right azygos fissure. Recommend repositioning. These results will be called to the ordering clinician or representative by the Radiologist Assistant, and communication documented in the PACS or [employer]. Electronically Signed   By: Jasmine Pang M.D.   On: 05/10/2023 18:02   [place] EKG SITE RITE Result Date: 05/10/2023 If Site Rite image not attached, placement could not be confirmed due to current cardiac rhythm.   Scheduled Meds:  atorvastatin  80 mg Oral Daily   baclofen  10 mg Oral TID   bisacodyl  10 mg Rectal Once   clopidogrel  75 mg Oral Daily   ezetimibe  10 mg Oral Daily   gabapentin  100 mg Oral TID   irbesartan  300 mg Oral Daily   And   hydrochlorothiazide  25 mg Oral Daily   insulin aspart  0-9 Units Subcutaneous TID WC   insulin glargine-yfgn  14 Units Subcutaneous Daily   methimazole  5 mg Oral q morning   metoCLOPramide (REGLAN) injection  10 mg Intravenous Q6H   metoprolol tartrate  100 mg Oral BID   pantoprazole (PROTONIX) IV  40 mg Intravenous Q24H   ranolazine  500 mg Oral BID   rivaroxaban  10 mg Oral Daily   sodium chloride flush  10-40 mL Intracatheter Q12H   sucralfate  1 g Oral TID WC & HS   Continuous Infusions:  lactated ringers 75 mL/hr at 05/12/23 0800     LOS: 2 days   Burnadette Pop, MD [facility] P4/08/2023, 9:35 AM

## 2023-05-13 ENCOUNTER — Other Ambulatory Visit (HOSPITAL_COMMUNITY): Payer: Self-pay

## 2023-05-13 DIAGNOSIS — K3184 Gastroparesis: Secondary | ICD-10-CM | POA: Diagnosis not present

## 2023-05-13 DIAGNOSIS — E1143 Type 2 diabetes mellitus with diabetic autonomic (poly)neuropathy: Secondary | ICD-10-CM | POA: Diagnosis not present

## 2023-05-13 LAB — BASIC METABOLIC PANEL WITH GFR
Anion gap: 6 (ref 5–15)
BUN: 10 mg/dL (ref 6–20)
CO2: 23 mmol/L (ref 22–32)
Calcium: 7.9 mg/dL — ABNORMAL LOW (ref 8.9–10.3)
Chloride: 102 mmol/L (ref 98–111)
Creatinine, Ser: 0.8 mg/dL (ref 0.44–1.00)
GFR, Estimated: >60 mL/min (ref >60)
Glucose, Bld: 180 mg/dL — ABNORMAL HIGH (ref 70–99)
Potassium: 3.2 mmol/L — ABNORMAL LOW (ref 3.5–5.1)
Sodium: 131 mmol/L — ABNORMAL LOW (ref 135–145)

## 2023-05-13 LAB — GLUCOSE, CAPILLARY
Glucose-Capillary: 165 mg/dL — ABNORMAL HIGH (ref 70–99)
Glucose-Capillary: 175 mg/dL — ABNORMAL HIGH (ref 70–99)

## 2023-05-13 LAB — MAGNESIUM: Magnesium: 1.7 mg/dL (ref 1.7–2.4)

## 2023-05-13 MED ORDER — IRON SUCROSE 200 MG IVPB - SIMPLE MED
200.0000 mg | Freq: Once | Status: AC
  Administered 2023-05-13: 200 mg via INTRAVENOUS
  Filled 2023-05-13: qty 200

## 2023-05-13 MED ORDER — FERROUS SULFATE 325 (65 FE) MG PO TABS: 325.0000 mg | ORAL_TABLET | Freq: Every day | ORAL | Status: DC

## 2023-05-13 MED ORDER — FERROUS SULFATE 325 (65 FE) MG PO TABS
325.0000 mg | ORAL_TABLET | Freq: Every day | ORAL | 0 refills | Status: DC
  Filled 2023-05-13: qty 60, 60d supply, fill #0

## 2023-05-13 MED ORDER — SUCRALFATE 1 G PO TABS
1.0000 g | ORAL_TABLET | Freq: Four times a day (QID) | ORAL | 0 refills | Status: DC
  Filled 2023-05-13: qty 56, 14d supply, fill #0

## 2023-05-13 MED ORDER — POTASSIUM CHLORIDE CRYS ER 20 MEQ PO TBCR
40.0000 meq | EXTENDED_RELEASE_TABLET | Freq: Once | ORAL | Status: AC
  Administered 2023-05-13: 40 meq via ORAL
  Filled 2023-05-13: qty 2

## 2023-05-13 MED ORDER — METOCLOPRAMIDE HCL 10 MG PO TABS
10.0000 mg | ORAL_TABLET | Freq: Three times a day (TID) | ORAL | 0 refills | Status: DC | PRN
  Filled 2023-05-13: qty 30, 10d supply, fill #0

## 2023-05-13 MED ORDER — POTASSIUM CHLORIDE CRYS ER 20 MEQ PO TBCR
20.0000 meq | EXTENDED_RELEASE_TABLET | Freq: Two times a day (BID) | ORAL | 0 refills | Status: DC
  Filled 2023-05-13: qty 14, 7d supply, fill #0

## 2023-05-13 MED ORDER — METOPROLOL TARTRATE 50 MG PO TABS
50.0000 mg | ORAL_TABLET | Freq: Two times a day (BID) | ORAL | 0 refills | Status: DC
Start: 2023-05-13 — End: 2023-05-29
  Filled 2023-05-13: qty 60, 30d supply, fill #0

## 2023-05-13 NOTE — Plan of Care (Signed)
  Problem: Clinical Measurements: Goal: Respiratory complications will improve Outcome: Progressing Goal: Cardiovascular complication will be avoided Outcome: Progressing   Problem: Nutrition: Goal: Adequate nutrition will be maintained Outcome: Progressing   Problem: Coping: Goal: Level of anxiety will decrease Outcome: Progressing   Problem: Pain Managment: Goal: General experience of comfort will improve and/or be controlled Outcome: Progressing   Problem: Safety: Goal: Ability to remain free from injury will improve Outcome: Progressing   Problem: Metabolic: Goal: Ability to maintain appropriate glucose levels will improve Outcome: Progressing

## 2023-05-13 NOTE — Progress Notes (Signed)
RA DL PICC removed per protocol per MD order. Manual pressure applied for 5 mins. Vaseline gauze, gauze, and Tegaderm applied over insertion site. No bleeding or swelling noted. Instructed patient to remain in bed for thirty mins. Educated patient about S/S of infection and when to call MD; no heavy lifting or pressure on right side for 24 hours; keep dressing dry and intact for 24 hours. Pt verbalized comprehension. 

## 2023-05-13 NOTE — Progress Notes (Signed)
 All discharge instructions reviewed w/patient, patient verbalized understanding, AVS placed in discharge packet for PTAR. Meds delivered to bed. Patient dressed in disposable scrubs and clean brief, awaiting [facility] for pick up.

## 2023-05-13 NOTE — Plan of Care (Signed)
  Problem: Education: Goal: Knowledge of General Education information will improve Description: Including pain rating scale, medication(s)/side effects and non-pharmacologic comfort measures Outcome: Adequate for Discharge   

## 2023-05-13 NOTE — TOC Transition Note (Signed)
 Transition of Care [facility]) - Discharge Note   Patient Details  Name: Lori [name] MRN: [id number] Date of Birth: August 13, 1970  Transition of Care [facility]) CM/SW Contact:  Larrie Kass, LCSW Phone Number: 05/13/2023, 12:15 PM   Clinical Narrative:    CSW met with the pt at bedside to discuss the d/c plan. Pt stated she would like to return home and has declined SNF placement. She mentioned that she lives with her husband. CSW received permission to speak with the pt's spouse to confirm the pt's care in the home. CSW spoke with pt's spouse [name], who reported that the pt receives 3 hours of aide services a day.  Pt's spouse is agreeable to the pt returning home. Spouse inquired about the time the pt may return. CSW informed him that the pt will be added to the [facility] list and will be picked up based on where she falls on the list. Pt is Active with [facility] for HHPT/OT/RN services. Will need orders MD made aware. No further TOC needs , TOC sign off.   Final next level of care: Home w Home Health Services Barriers to Discharge: Barriers Resolved   Patient Goals and CMS Choice Patient states their goals for this hospitalization and ongoing recovery are:: return home          Discharge Placement                  Name of family member notified: Kurtis Bushman (Spouse)  [phone] (Mobile) Patient and family notified of of transfer: 05/13/23  Discharge Plan and Services Additional resources added to the After Visit Summary for                                       Social Drivers of Health (SDOH) Interventions SDOH Screenings   Food Insecurity: No Food Insecurity (05/09/2023)  Housing: Low Risk  (05/09/2023)  Transportation Needs: No Transportation Needs (05/09/2023)  Utilities: Not At Risk (05/09/2023)  Depression (PHQ2-9): Low Risk  (04/03/2023)  Tobacco Use: Low Risk  (05/09/2023)     Readmission Risk Interventions    05/01/2023     3:01 PM 12/28/2022   11:30 AM 11/05/2022    1:38 PM  Readmission Risk Prevention Plan  Transportation Screening Complete Complete Complete  Medication Review Oceanographer) Complete Complete Complete  PCP or Specialist appointment within 3-5 days of discharge Complete Complete Complete  HRI or Home Care Consult Complete Complete Complete  SW Recovery Care/Counseling Consult Complete Complete Complete  Palliative Care Screening Not Applicable Not Applicable Not Applicable  Skilled Nursing Facility Patient Refused Not Applicable Not Applicable

## 2023-05-13 NOTE — Progress Notes (Signed)
 PROGRESS NOTE  Lori [name] [name]  ONG:[id number] DOB: January 17, 1971 DOA: 05/09/2023 PCP: Caesar Bookman, NP   Brief Narrative: Patient is a 53 year old female with history of diabetes type 2 with gastroparesis, coronary disease status post CABG, chronic diastolic CHF, OSA on CPAP, CVA with residual left-sided weakness, wheelchair-bound, history of DVT on Xarelto, history of PE, hypothyroidism, frequent hospitalization for gastroparesis who presented with symptoms of intractable nausea, vomiting.  Patient was recently admitted and was discharged at the end of the March when she presented with diabetic ketoacidosis.  Found to have DKA.  Treated with IV insulin.  Continued  to have severe gastroparesis, GERD symptoms.  GI consulted .  Started on Carafate, increased the dose of Reglan.  Now clinically improving.  PT consulted, recommended SNF on discharge.  Patient is medically stable for discharge to SNF whenever possible.  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  Was started on insulin drip, now on long-acting and sliding scale.  Monitor blood sugars.  Diabetic coordinator does not recommend Ozempic to be continued at discharge  History of diabetes gastroparesis/severe GERD: Chronic problem.  Marland Kitchen  She was following with [facility].  We consulted [facility].She  complaints of some dysphagia symptoms as well.  No plan for EGD.  Now  clinical improving.  Continue Carafate, Reglan  History of stroke with residual left hemiplegia, left upper extremity contractures: Patient is bedbound, wheelchair dependent at home.  On her last admission, she was recommended SNF on discharge.  She wanted to go home.  Repeat PT evaluation done here, recommended SNF on discharge.  Leukocytosis/sinus tachycardia.HTN: This was most likely secondary to dehydration, volume depletion from DKA.  Low suspicion for acute infectious process.  Urinalysis doesnot suggest UTI.  Leucocytosis resolved. Home medications metoprolol restarted.  Iron deficiency anemia: Low iron as noted per iron studies.  Being given a dose of IV iron.  Continue oral iron supplementation on discharge.  Hemoglobin stable.  No evidence of acute blood loss  Hypokalemia: Currently being monitored and supplemented  History of hyperthyroidism: On methimazole, normal TSH  History of hyperlipidemia: On Lipitor, Zetia  History of DVT: On Xarelto  History of depression/anxiety: On Lexapro  History of sleep apnea: On CPAP  History of coronary artery disease: Status post CABG.  No chest pain.  On Plavix.  On metoprolol, Ranexa  Denies any chest pain       DVT prophylaxis:rivaroxaban (XARELTO) tablet 10 mg Start: 05/10/23 1115 rivaroxaban (XARELTO) tablet 10 mg     Code Status: Full Code  Family Communication: called husband Mr Kern Alberta on phone for update on 4/6  Patient status:Inpatient  Patient is from :Home  Anticipated discharge to:SNF  Estimated DC date:whenever possible   Consultants: GI  Procedures:None  Antimicrobials:  Anti-infectives (From admission, onward)    None       Subjective: Patient seen and examined at bedside today.  Hemodynamically stable.  Blood pressure stable today.  Sinus tachycardia has improved.  She looks comfortable.  Denies any abdomen pain, nausea or vomiting.  Tolerating soft diet.  Objective: Vitals:   05/12/23 2000  05/12/23 2351 05/13/23 0000 05/13/23 0800  BP: 129/74  (!) 121/90   Pulse: (!) 104  90   Resp: 19  19   Temp: 100 F (37.8 C) 98.3 F (36.8 C)  98.6 F (37 C)  TempSrc: Oral Oral  Oral  SpO2: 95%  96%   Weight:      Height:        Intake/Output Summary (Last 24 hours) at 05/13/2023 1108 Last data filed at 05/13/2023 8657 Gross per 24 hour  Intake 348.67 ml  Output 140 ml  Net 208.67 ml   Filed Weights   05/09/23 1846 05/10/23 0849  Weight: 83.9 kg 77.5 kg    Examination:   General exam: Overall comfortable, not in distress, chronically deconditioned, weak, obese HEENT: PERRL Respiratory system:  no wheezes or crackles  Cardiovascular system: S1 & S2 heard, RRR.  Gastrointestinal system: Abdomen is nondistended, soft and nontender. Central nervous system: Alert and oriented, dysarthria, left hemiplegia, contracture of left upper extremity Extremities: No edema, no clubbing ,no cyanosis Skin: No rashes, no ulcers,no icterus     Data Reviewed: I have personally reviewed following labs and imaging studies  CBC: Recent Labs  Lab 05/09/23 1948 05/10/23 0603 05/11/23 0136 05/11/23 0851 05/12/23 0529  WBC 20.1* 19.8* 13.0* 12.9* 9.3  NEUTROABS 15.8*  --   --   --   --   HGB 13.4 13.0 9.0* 10.2* 8.7*  HCT 41.5 39.6 27.5* 30.6* 25.8*  MCV 81.2 80.5 81.4 79.3* 81.1  PLT 494* 490* 367 390 301   Basic Metabolic Panel: Recent Labs  Lab 05/10/23 0603 05/10/23 1840 05/11/23 0851 05/12/23 0529 05/13/23 0453  NA 134* 140 135 134* 131*  K 3.8 4.0 2.9* 3.7 3.2*  CL 97* 106 102 105 102  CO2 20* 21* 22 22 23   GLUCOSE 439* 173* 204* 144* 180*  BUN 20 17 10 7 10   CREATININE 1.04* 0.82 0.72 0.69 0.80  CALCIUM 9.5 8.9 8.4* 8.0* 7.9*  MG  --   --  1.4*  --  1.7     No results found for this or any previous visit (from the past 240 hours).   Radiology Studies: No results found.   Scheduled Meds:  atorvastatin  80 mg Oral Daily   baclofen  10 mg Oral TID    bisacodyl  10 mg Rectal Once   clopidogrel  75 mg Oral Daily   ezetimibe  10 mg Oral Daily   feeding supplement  237 mL Oral BID BM   gabapentin  100 mg Oral TID   insulin aspart  0-9 Units Subcutaneous TID WC   insulin glargine-yfgn  14 Units Subcutaneous Daily   methimazole  5 mg Oral q morning   metoCLOPramide (REGLAN) injection  10 mg Intravenous Q6H   metoprolol tartrate  50 mg Oral BID   pantoprazole (PROTONIX) IV  40 mg Intravenous Q24H   polyethylene glycol  17 g Oral Daily   ranolazine  500 mg Oral BID   rivaroxaban  10 mg Oral Daily   sodium chloride flush  10-40 mL Intracatheter Q12H   sucralfate  1 g Oral TID WC & HS   Continuous Infusions:     LOS: 3 days   Burnadette Pop, MD [facility] P4/09/2023, 11:08 AM

## 2023-05-13 NOTE — Discharge Summary (Addendum)
 Physician Discharge Summary  Lori [name] [name] ZHY:[id number] DOB: 21-Mar-1970 DOA: 05/09/2023  PCP: Caesar Bookman, NP  Admit date: 05/09/2023 Discharge date: 05/13/2023  Admitted From: Home Disposition:  Home  Discharge Condition:Stable CODE STATUS:FULL Diet recommendation:  Carb Modified   Brief/Interim Summary: Patient is a 53 year old female with history of diabetes type 2 with gastroparesis, coronary disease status post CABG, chronic diastolic CHF, OSA on CPAP, CVA with residual left-sided weakness, wheelchair-bound, history of DVT on Xarelto, history of PE, hypothyroidism, frequent hospitalization for gastroparesis who presented with symptoms of intractable nausea, vomiting.  Patient was recently admitted and was discharged at the end of the March when she presented with diabetic ketoacidosis.  Found to have DKA.  Treated with IV insulin.  Continued  to have severe gastroparesis, GERD symptoms.  GI consulted .  Started on Carafate, increased the dose of Reglan.  Now clinically improving.  PT consulted, recommended SNF on discharge.  Patient declines and wants to go home with home health.  Medically stable for discharge  Following problems were addressed during the hospitalization:  DKA: Presented with nausea, vomiting.  Was started on insulin drip, now on long-acting and sliding scale.  Continue home insulin regimen at discharge.   History of diabetes gastroparesis/severe GERD: Chronic problem.  Marland Kitchen  She was following with  GI.  We consulted [facility].She  complaints of some dysphagia symptoms as well.  No plan for EGD.  Now clinical improving.  Continue Carafate, Reglan   History of stroke with residual left hemiplegia, left upper extremity contractures: Patient is bedbound, wheelchair dependent at home.  On her last admission, she was recommended SNF on discharge.  She wanted to go home.  Repeat PT evaluation done here, recommended SNF on discharge.  Again declines  and wants to go home.   Leukocytosis/sinus tachycardia.HTN: This was most likely secondary to dehydration, volume depletion from DKA.  Low suspicion for acute infectious process.  Urinalysis doesnot suggest UTI.  Leucocytosis resolved. Home medications metoprolol restarted.   Iron deficiency anemia: Low iron as noted per iron studies.  Being given a dose of IV iron.  Continue oral iron supplementation on discharge.  Hemoglobin stable.  No evidence of acute blood loss   Hypokalemia: Currently being monitored    History of hyperthyroidism: On methimazole, normal TSH   History of hyperlipidemia: On Lipitor, Zetia   History of DVT: On Xarelto   History of depression/anxiety: On Lexapro   History of sleep apnea: On CPAP   History of coronary artery disease: Status post CABG.  No chest pain.  On Plavix.  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  Do a  CBC, BMP tests during  the follow-up 3)Take small-volume, frequent nonfatty meals to prevent diabetic gastroparesis 4)Monitor your blood sugar at home   Increase activity slowly   Complete by: As directed       Allergies as of  05/13/2023       Reactions   Asa [aspirin] Anaphylaxis, Swelling, Other (See Comments)   Throat closes   Contrast Media [iodinated Contrast Media] Anaphylaxis, Swelling   Imitrex [sumatriptan] Anaphylaxis   Ms Contin [morphine] Anaphylaxis, Swelling   Penicillins Swelling, Other (See Comments)   Mouth and eyes swell   Chlorhexidine Gluconate [chlorhexidine] Itching, Rash   Firvanq [vancomycin] Other (See Comments)   Red Man's Syndrome   Cipro [ciprofloxacin Hcl] Nausea And Vomiting   Nitrofuran Derivatives Anxiety   Wound Dressing Adhesive Itching, Rash        Medication List     STOP taking these medications    fluticasone 50 MCG/ACT nasal spray Commonly known as: FLONASE   LORazepam 0.5 MG tablet Commonly known as: ATIVAN   olmesartan-hydrochlorothiazide 40-25 MG tablet Commonly known as: BENICAR HCT   Prucalopride Succinate 2 MG Tabs Commonly known as: Motegrity   sucralfate 1 GM/10ML suspension Commonly known as: CARAFATE Replaced by: sucralfate 1 g tablet       TAKE these medications    acetaminophen 325 MG tablet Commonly known as: TYLENOL Take 2 tablets (650 mg total) by mouth every 6 (six) hours as needed for mild pain (pain score 1-3) (or Fever >/= 101).   albuterol 108 (90 Base) MCG/ACT inhaler Commonly known as: VENTOLIN HFA INHALE 2 PUFFS BY MOUTH EVERY 6 HOURS AS NEEDED FOR WHEEZING AND FOR SHORTNESS OF BREATH Strength: 108 (90 Base) MCG/ACT   albuterol (2.5 MG/3ML) 0.083% nebulizer solution Commonly known as: PROVENTIL Take 3 mLs (2.5 mg total) by nebulization every 6 (six) hours as needed for wheezing or shortness of breath.   atorvastatin 80 MG tablet Commonly known as: LIPITOR TAKE 1 TABLET (80 MG TOTAL) BY MOUTH DAILY.   baclofen 10 MG tablet Commonly known as: LIORESAL Take 1 tablet (10 mg total) by mouth 3 (three) times daily.   clopidogrel 75 MG tablet Commonly known as: PLAVIX TAKE 1 TABLET BY MOUTH DAILY   cyclobenzaprine 5  MG tablet Commonly known as: FLEXERIL Take 1 tablet (5 mg total) by mouth 3 (three) times daily as needed for muscle spasms.   escitalopram 20 MG tablet Commonly known as: LEXAPRO Take 1 tablet (20 mg total) by mouth every morning.   ezetimibe 10 MG tablet Commonly known as: ZETIA TAKE 1 TABLET BY MOUTH DAILY   ferrous sulfate 325 (65 FE) MG tablet Take 1 tablet (325 mg total) by mouth daily with breakfast. Start taking on: May 14, 2023   FreeStyle Libre 14 Day Sensor Misc 1 each by Does not apply route as needed. DX: E11.69   FreeStyle Libre 2 Reader Devi 1 Device by Does not apply route daily. E11.69   gabapentin 100 MG capsule Commonly known as: NEURONTIN Take 100 mg by mouth 3 (three) times daily.   HumaLOG KwikPen 100 UNIT/ML KwikPen Generic drug: insulin lispro INJECT 4 UNITS SUBCUTANEOUSLY THREE TIMES A DAY WITH MEALS   insulin lispro 100 UNIT/ML injection Commonly known as: HUMALOG Sliding scale AC, HS CBG 121 - 150: 2 units CBG 151 - 200: 3 units CBG 201 - 250: 5 units CBG 251 - 300: 8 units CBG 301 - 350: 11 units CBG 351 - 400: 15 units CBG >  400: call MD   Lantus SoloStar 100 UNIT/ML Solostar Pen Generic drug: insulin glargine Inject 10 Units into the skin at bedtime.   methimazole 5 MG tablet Commonly known as: TAPAZOLE TAKE 1 TABLET BY MOUTH EVERY  MORNING   metoCLOPramide 10 MG tablet Commonly known as: REGLAN Take 1 tablet (10 mg total) by mouth every 8 (eight) hours as needed for refractory nausea / vomiting.   metoprolol tartrate 50 MG tablet Commonly known as: LOPRESSOR Take 1 tablet (50 mg total) by mouth 2 (two) times daily. What changed:  medication strength how much to take additional instructions   pantoprazole 40 MG tablet Commonly known as: PROTONIX Take 1 tablet (40 mg total) by mouth 2 (two) times daily.   potassium chloride SA 20 MEQ tablet Commonly known as: KLOR-CON M Take 1 tablet (20 mEq total) by mouth 2 (two) times daily  for 7 days.   ranolazine 500 MG 12 hr tablet Commonly known as: RANEXA Take 1 tablet (500 mg total) by mouth 2 (two) times daily.   senna-docusate 8.6-50 MG tablet Commonly known as: Senokot-S Take 1 tablet by mouth 2 (two) times daily.   sorbitol 70 % Soln Take 30 mLs by mouth daily as needed for moderate constipation.   sucralfate 1 g tablet Commonly known as: Carafate Take 1 tablet (1 g total) by mouth 4 (four) times daily for 14 days. Replaces: sucralfate 1 GM/10ML suspension   tamsulosin 0.4 MG Caps capsule Commonly known as: FLOMAX Take 1 capsule (0.4 mg total) by mouth at bedtime.   topiramate 25 MG tablet Commonly known as: TOPAMAX Take 1 tablet (25 mg total) by mouth 2 (two) times daily.   Voltaren 1 % Gel Generic drug: diclofenac Sodium Apply 1 Application topically 4 (four) times daily as needed (pain).   Xarelto 10 MG Tabs tablet Generic drug: rivaroxaban TAKE 1 TABLET BY MOUTH DAILY        Allergies  Allergen Reactions   Asa [Aspirin] Anaphylaxis, Swelling and Other (See Comments)    Throat closes   Contrast Media [Iodinated Contrast Media] Anaphylaxis and Swelling   Imitrex [Sumatriptan] Anaphylaxis   Ms Contin [Morphine] Anaphylaxis and Swelling   Penicillins Swelling and Other (See Comments)    Mouth and eyes swell   Chlorhexidine Gluconate [Chlorhexidine] Itching and Rash   Firvanq [Vancomycin] Other (See Comments)    Red Man's Syndrome   Cipro [Ciprofloxacin Hcl] Nausea And Vomiting   Nitrofuran Derivatives Anxiety   Wound Dressing Adhesive Itching and Rash    Consultations: GI   Procedures/Studies: DG CHEST PORT 1 VIEW Result Date: 05/10/2023 CLINICAL DATA:  Status post PICC placement. EXAM: PORTABLE CHEST 1 VIEW COMPARISON:  Earlier radiograph dated 05/10/2023. FINDINGS: Right-sided PICC with tip close to the cavoatrial junction. No focal consolidation, pleural effusion or pneumothorax. The cardiac silhouette is within limits. Median  sternotomy wires and CABG vascular clips. No acute osseous pathology. IMPRESSION: Right-sided PICC with tip close to the cavoatrial junction. Electronically Signed   By: Elgie Collard M.D.   On: 05/10/2023 20:09   [place] EKG Site Rite Result Date: 05/10/2023 If Site Rite image not attached, placement could not be confirmed due to current cardiac rhythm.  DG CHEST PORT 1 VIEW Result Date: 05/10/2023 CLINICAL DATA:  PICC line placement EXAM: PORTABLE CHEST 1 VIEW COMPARISON:  04/27/2023 FINDINGS: Sternotomy. No acute airspace disease, pleural effusion, or pneumothorax. A right azygous lobe/fissure is noted. Right upper extremity central venous catheter tip with abnormal orientation, curved and  directed superiorly in the region of the right azygos fissure. IMPRESSION: Right upper extremity central venous catheter tip with abnormal orientation, curved and directed superiorly in the region of the right azygos fissure. Recommend repositioning. These results will be called to the ordering clinician or representative by the Radiologist Assistant, and communication documented in the PACS or [employer]. Electronically Signed   By: Jasmine Pang M.D.   On: 05/10/2023 18:02   [place] EKG SITE RITE Result Date: 05/10/2023 If Site Rite image not attached, placement could not be confirmed due to current cardiac rhythm.  DG Chest Port 1 View Result Date: 04/27/2023 CLINICAL DATA:  Lactic acidosis EXAM: PORTABLE CHEST 1 VIEW COMPARISON:  04/09/2023 FINDINGS: Post sternotomy changes. No acute airspace disease or effusion. Stable cardiomediastinal silhouette with aortic atherosclerosis. No pneumothorax IMPRESSION: No active disease. Electronically Signed   By: Jasmine Pang M.D.   On: 04/27/2023 18:22   CT ABDOMEN PELVIS WO CONTRAST Result Date: 04/27/2023 CLINICAL DATA:  Abdominal pain EXAM: CT ABDOMEN AND PELVIS WITHOUT CONTRAST TECHNIQUE: Multidetector CT imaging of the abdomen and pelvis was performed following the  standard protocol without IV contrast. RADIATION DOSE REDUCTION: This exam was performed according to the departmental dose-optimization program which includes automated exposure control, adjustment of the mA and/or kV according to patient size and/or use of iterative reconstruction technique. COMPARISON:  CT 02/04/2023 FINDINGS: Lower chest: Lung bases are clear. Coronary vascular calcifications. Small hiatal hernia Hepatobiliary: No focal liver abnormality is seen. No gallstones, gallbladder wall thickening, or biliary dilatation. Pancreas: Coarse pancreatic calcifications suggestive of chronic pancreatitis. No acute inflammation Spleen: Normal in size without focal abnormality. Adrenals/Urinary Tract: History of right adrenalectomy. Left adrenal gland demonstrates mild diffuse thickening but no mass. No hydronephrosis. The bladder is unremarkable. Stomach/Bowel: Stomach is within normal limits. Appendix appears normal. No evidence of bowel wall thickening, distention, or inflammatory changes. Vascular/Lymphatic: Aortic atherosclerosis. No enlarged abdominal or pelvic lymph nodes. Reproductive: Hysterectomy.  No adnexal mass Other: Negative for pelvic effusion or free air. Lobulated soft tissue density at the low midline anterior pelvis is chronic and may relate to chronic scarring. Musculoskeletal: No acute osseous abnormality IMPRESSION: 1. No CT evidence for acute intra-abdominal or pelvic abnormality. 2. Small hiatal hernia. 3. Findings suggestive of chronic pancreatitis. No acute inflammatory changes about the pancreas 4. Aortic atherosclerosis. Electronically Signed   By: Jasmine Pang M.D.   On: 04/27/2023 17:17   DG Wrist Complete Left Result Date: 04/27/2023 CLINICAL DATA:  Wrist pain EXAM: LEFT WRIST - COMPLETE 3+ VIEW COMPARISON:  12/20/2021 FINDINGS: Bony demineralization. 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 The orientation of the  scaphoid contributes to bony superimposition and makes it difficult to fully assess. No definite fracture identified. IMPRESSION: 1. 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 2. Bony demineralization. Electronically Signed   By: Gaylyn Rong M.D.   On: 04/27/2023 16:59      Subjective: Patient seen and examined at bedside today.  Hemodynamically stable.  Blood pressure stable with stable heart rate.  She feels better.  Denies any abdomen pain, nausea or vomiting today.  Medically stable for discharge.I called and discussed the discharge plan with her husband on phone today  Discharge Exam: Vitals:   05/13/23 1100 05/13/23 1200  BP: 100/60 105/79  Pulse: 94 95  Resp: 16 18  Temp:  98.6 F (37 C)  SpO2: 94% 94%   Vitals:   05/13/23 0900 05/13/23  1000 05/13/23 1100 05/13/23 1200  BP: 119/74 (!) 118/90 100/60 105/79  Pulse: (!) 114 (!) 108 94 95  Resp: 18 16 16 18   Temp:    98.6 F (37 C)  TempSrc:    Oral  SpO2: 94% 94% 94% 94%  Weight:      Height:        General: Pt is alert, awake, not in acute distress, very deconditioned cardiovascular: RRR, S1/S2 +, no rubs, no gallops Respiratory: CTA bilaterally, no wheezing, no rhonchi Abdominal: Soft, NT, ND, bowel sounds + Extremities: no edema, no cyanosis    The results of significant diagnostics from this hospitalization (including imaging, microbiology, ancillary and laboratory) are listed below for reference.     Microbiology: No results found for this or any previous visit (from the past 240 hours).   Labs: BNP (last 3 results) Recent Labs    05/22/22 0229 09/06/22 2242  BNP 49.6 19.5   Basic Metabolic Panel: Recent Labs  Lab 05/10/23 0603 05/10/23 1840 05/11/23 0851 05/12/23 0529 05/13/23 0453  NA 134* 140 135 134* 131*  K 3.8 4.0 2.9* 3.7 3.2*  CL 97* 106 102 105 102  CO2 20* 21* 22 22 23   GLUCOSE 439* 173* 204* 144* 180*  BUN 20 17 10 7 10    CREATININE 1.04* 0.82 0.72 0.69 0.80  CALCIUM 9.5 8.9 8.4* 8.0* 7.9*  MG  --   --  1.4*  --  1.7   Liver Function Tests: Recent Labs  Lab 05/08/23 1434 05/09/23 1948 05/10/23 0603  AST 16 27 16   ALT 18 20 22   ALKPHOS 76 83 81  BILITOT 0.5 1.2 0.8  PROT 7.9 8.2* 8.2*  ALBUMIN 3.7 3.8 3.6   Recent Labs  Lab 05/08/23 1434 05/09/23 1948  LIPASE 28 23   No results for input(s): "AMMONIA" in the last 168 hours. CBC: Recent Labs  Lab 05/09/23 1948 05/10/23 0603 05/11/23 0136 05/11/23 0851 05/12/23 0529  WBC 20.1* 19.8* 13.0* 12.9* 9.3  NEUTROABS 15.8*  --   --   --   --   HGB 13.4 13.0 9.0* 10.2* 8.7*  HCT 41.5 39.6 27.5* 30.6* 25.8*  MCV 81.2 80.5 81.4 79.3* 81.1  PLT 494* 490* 367 390 301   Cardiac Enzymes: No results for input(s): "CKTOTAL", "CKMB", "CKMBINDEX", "TROPONINI" in the last 168 hours. BNP: Invalid input(s): "POCBNP" CBG: Recent Labs  Lab 05/12/23 1154 05/12/23 1727 05/12/23 2133 05/13/23 0828 05/13/23 1203  GLUCAP 178* 164* 174* 165* 175*   D-Dimer No results for input(s): "DDIMER" in the last 72 hours. Hgb A1c No results for input(s): "HGBA1C" in the last 72 hours. Lipid Profile No results for input(s): "CHOL", "HDL", "LDLCALC", "TRIG", "CHOLHDL", "LDLDIRECT" in the last 72 hours. Thyroid function studies Recent Labs    05/11/23 0851  TSH 0.437   Anemia work up Recent Labs    05/12/23 1743  FERRITIN 31  TIBC 241*  IRON 14*   Urinalysis    Component Value Date/Time   COLORURINE YELLOW 05/10/2023 0259   APPEARANCEUR HAZY (A) 05/10/2023 0259   LABSPEC 1.013 05/10/2023 0259   PHURINE 5.0 05/10/2023 0259   GLUCOSEU >=500 (A) 05/10/2023 0259   HGBUR SMALL (A) 05/10/2023 0259   BILIRUBINUR NEGATIVE 05/10/2023 0259   KETONESUR 20 (A) 05/10/2023 0259   PROTEINUR NEGATIVE 05/10/2023 0259   NITRITE NEGATIVE 05/10/2023 0259   LEUKOCYTESUR NEGATIVE 05/10/2023 0259   Sepsis Labs Recent Labs  Lab 05/10/23 0603 05/11/23 0136  05/11/23 0851 05/12/23  0529  WBC 19.8* 13.0* 12.9* 9.3   Microbiology No results found for this or any previous visit (from the past 240 hours).  Please note: You were cared for by a hospitalist during your hospital stay. Once you are discharged, your primary care physician will handle any further medical issues. Please note that NO REFILLS for any discharge medications will be authorized once you are discharged, as it is imperative that you return to your primary care physician (or establish a relationship with a primary care physician if you do not have one) for your post hospital discharge needs so that they can reassess your need for medications and monitor your lab values.    Time coordinating discharge: 40 minutes  SIGNED:   Burnadette Pop, MD  [facility] 05/13/2023, 1:37 PM Pager [phone]  If 7PM-7AM, please contact night-coverage [email or web address] Password [email or web address]

## 2023-05-13 NOTE — [place]2 (Signed)
 [place] MEDICAID FL2 LEVEL OF CARE FORM     IDENTIFICATION  Patient Name: Lori [name] Birthdate: May 12, 1970 Sex: female Admission Date (Current Location): 05/09/2023  [facility] and [place] Number:  Producer, television/film/video and Address:  [facility],  [address], [place] [address]      Provider Number: [id number]  Attending Physician Name and Address:  Burnadette Pop, MD  Relative Name and Phone Number:       Current Level of Care: Hospital Recommended Level of Care: Skilled Nursing Facility Prior Approval Number:    Date Approved/Denied:   PASRR Number: [id number]  Discharge Plan: SNF    Current Diagnoses: Patient Active Problem List   Diagnosis Date Noted   Nausea 05/11/2023   Acute renal failure (ARF) (HCC) 04/27/2023   Gastrointestinal hemorrhage 02/13/2023   Anxiety and depression 02/13/2023   Insulin dependent type 2 diabetes mellitus (HCC) 02/13/2023   Hypomagnesemia 02/06/2023   Metabolic acidosis 02/06/2023   Acute esophagitis 02/06/2023   Adenomatous duodenal polyp 02/06/2023   Gastric polyp 02/06/2023   COVID-19 virus infection 02/05/2023   Hematemesis 02/04/2023   Anticoagulated 02/04/2023   Urinary incontinence 01/22/2023   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   Diabetic hyperosmolar non-ketotic state (HCC) 01/19/2023   Cerebrovascular disease 01/03/2023   Hypokalemia 01/02/2023   Thrombocytosis 01/02/2023   Microcytic anemia 01/02/2023   OSA on CPAP 01/02/2023   Acute cystitis 12/26/2022   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 12/25/2022   History of DVT (deep vein thrombosis) 12/25/2022   S/P total hysterectomy 12/24/2022   Delirium 11/22/2022   Acute encephalopathy 11/04/2022   Antiplatelet or antithrombotic long-term use 11/04/2022   Hypotension 11/04/2022   Acute metabolic encephalopathy 11/04/2022   Morbid obesity (HCC) 10/13/2022   Aspiration pneumonia (HCC) 10/13/2022   Chronic diastolic CHF  (congestive heart failure) (HCC) 10/13/2022   Chronic anemia 10/13/2022   Acute pancreatitis 10/11/2022   Chest pain of uncertain etiology 10/06/2022   Coronary artery disease with chronic angina 10/06/2022   Generalized anxiety disorder 10/06/2022   Reactive airway disease 10/06/2022   UTI (urinary tract infection) 07/04/2022   Mild intermittent asthma, uncomplicated 07/04/2022   Lethargic 05/01/2022   Hyponatremia 04/30/2022   Hyperkalemia 04/30/2022   AKI (acute kidney injury) (HCC) 04/30/2022   Wheelchair dependence 04/08/2022   Spasticity as late effect of cerebrovascular accident (CVA) 04/08/2022   Left hemiplegia (HCC) 04/08/2022   Dyspnea 11/02/2021   GERD (gastroesophageal reflux disease) 11/02/2021   Nausea and vomiting 08/25/2021   Acute bronchitis 08/24/2021   Hypothyroidism 07/10/2021   Functional urinary incontinence 05/17/2021   MDD (major depressive disorder), recurrent episode, severe (HCC) 04/09/2021   Diabetic gastroparesis (HCC) 03/16/2021   Migraines 03/16/2021   History of CVA with residual deficit    Debility 02/26/2021   DKA (diabetic ketoacidosis) (HCC) 02/19/2021   Constipation    Weakness    Type 2 diabetes mellitus with hyperlipidemia (HCC)    Aphasia    Spell of abnormal behavior    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 01/19/2021   Tachycardia 01/19/2021   CAD (coronary artery disease) 01/19/2021   Spastic hemiparesis of left 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 (HCC) 01/19/2021   Hyperthyroidism    Essential hypertension    Pheochromocytoma    Depression    Hyperlipidemia    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   Noncompliance 10/19/2016   History of pheochromocytoma 10/25/2015   Carotid stenosis, bilateral  10/24/2015   Hemiplegia, unspecified affecting right dominant side (HCC) 07/18/2015   Geographic tongue 07/07/2015    Orientation RESPIRATION BLADDER Height & Weight      Self, Situation, Place  Normal Incontinent Weight: 170 lb 13.7 oz (77.5 kg) Height:  5\' 2"  (157.5 cm)  BEHAVIORAL SYMPTOMS/MOOD NEUROLOGICAL BOWEL NUTRITION STATUS      Incontinent Diet  AMBULATORY STATUS COMMUNICATION OF NEEDS Skin   Extensive Assist Verbally Normal                       Personal Care Assistance Level of Assistance  Bathing, Feeding, Dressing Bathing Assistance: Maximum assistance Feeding assistance: Limited assistance Dressing Assistance: Maximum assistance     Functional Limitations Info  Hearing, Speech, Sight Sight Info: Impaired (glasses) Hearing Info: Adequate Speech Info: Impaired (slurred)    SPECIAL CARE FACTORS FREQUENCY        PT Frequency: 5 x a week OT Frequency: 5 x a week            Contractures Contractures Info: Not present    Additional Factors Info  Allergies, Code Status, Insulin Sliding Scale Code Status Info: full Allergies Info: Asa (Aspirin)  Contrast Media (Iodinated Contrast Media)  Imitrex (Sumatriptan)  Ms Contin (Morphine)  Penicillins  Chlorhexidine Gluconate (Chlorhexidine)  Firvanq (Vancomycin)  Cipro (Ciprofloxacin Hcl)  Nitrofuran Derivatives  Wound Dressing Adhesive   Insulin Sliding Scale Info: see d/c       Current Medications (05/13/2023):  This is the current hospital active medication list Current Facility-Administered Medications  Medication Dose Route Frequency Provider Last Rate Last Admin   acetaminophen (TYLENOL) tablet 650 mg  650 mg Oral Q6H PRN Burnadette Pop, MD   650 mg at 05/12/23 1300   atorvastatin (LIPITOR) tablet 80 mg  80 mg Oral Daily Burnadette Pop, MD   80 mg at 05/13/23 1017   baclofen (LIORESAL) tablet 10 mg  10 mg Oral TID Burnadette Pop, MD   10 mg at 05/13/23 1017   bisacodyl (DULCOLAX) suppository 10 mg  10 mg Rectal Once Burnadette Pop, MD       clopidogrel (PLAVIX) tablet 75 mg  75 mg Oral Daily Burnadette Pop, MD   75 mg at 05/13/23 1017   cyclobenzaprine (FLEXERIL)  tablet 5 mg  5 mg Oral TID PRN Burnadette Pop, MD   5 mg at 05/10/23 2256   dextrose 50 % solution 0-50 mL  0-50 mL Intravenous PRN Burnadette Pop, MD       diphenhydrAMINE (BENADRYL) injection 25 mg  25 mg Intravenous Q6H PRN Burnadette Pop, MD   25 mg at 05/12/23 1300   ezetimibe (ZETIA) tablet 10 mg  10 mg Oral Daily Burnadette Pop, MD   10 mg at 05/13/23 1017   feeding supplement (ENSURE ENLIVE / ENSURE PLUS) liquid 237 mL  237 mL Oral BID BM Adhikari, Amrit, MD   237 mL at 05/13/23 1019   [START ON 05/14/2023] ferrous sulfate tablet 325 mg  325 mg Oral Q breakfast Adhikari, Amrit, MD       gabapentin (NEURONTIN) capsule 100 mg  100 mg Oral TID Burnadette Pop, MD   100 mg at 05/13/23 1017   hydrALAZINE (APRESOLINE) injection 10 mg  10 mg Intravenous Q4H PRN Lewie Chamber, MD       insulin aspart (novoLOG) injection 0-9 Units  0-9 Units Subcutaneous TID WC Burnadette Pop, MD   2 Units at 05/13/23 0833   insulin glargine-yfgn (SEMGLEE) injection 14 Units  14 Units Subcutaneous Daily Burnadette Pop, MD   14 Units at 05/13/23 1019   labetalol (NORMODYNE) injection 10 mg  10 mg Intravenous Q4H PRN Lewie Chamber, MD   10 mg at 05/11/23 0211   LORazepam (ATIVAN) tablet 0.5 mg  0.5 mg Oral QHS PRN Lewie Chamber, MD   0.5 mg at 05/12/23 2159   menthol-cetylpyridinium (CEPACOL) lozenge 3 mg  1 lozenge Oral PRN Lewie Chamber, MD       methimazole (TAPAZOLE) tablet 5 mg  5 mg Oral q morning Burnadette Pop, MD   5 mg at 05/13/23 1018   metoCLOPramide (REGLAN) injection 10 mg  10 mg Intravenous Q6H Burnadette Pop, MD   10 mg at 05/13/23 0634   metoprolol tartrate (LOPRESSOR) tablet 50 mg  50 mg Oral BID Burnadette Pop, MD   50 mg at 05/13/23 1017   ondansetron (ZOFRAN) tablet 4 mg  4 mg Oral Q6H PRN Rometta Emery, MD       Or   ondansetron (ZOFRAN) injection 4 mg  4 mg Intravenous Q6H PRN Rometta Emery, MD   4 mg at 05/11/23 2149   Oral care mouth rinse  15 mL Mouth Rinse PRN Burnadette Pop, MD       pantoprazole (PROTONIX) injection 40 mg  40 mg Intravenous Q24H Lewie Chamber, MD   40 mg at 05/13/23 0453   polyethylene glycol (MIRALAX / GLYCOLAX) packet 17 g  17 g Oral Daily Zehr, Jessica D, PA-C       prochlorperazine (COMPAZINE) injection 10 mg  10 mg Intravenous Q6H PRN Lewie Chamber, MD   10 mg at 05/11/23 0530   ranolazine (RANEXA) 12 hr tablet 500 mg  500 mg Oral BID Burnadette Pop, MD   500 mg at 05/13/23 1018   rivaroxaban (XARELTO) tablet 10 mg  10 mg Oral Daily Burnadette Pop, MD   10 mg at 05/13/23 1018   sodium chloride flush (NS) 0.9 % injection 10-40 mL  10-40 mL Intracatheter Q12H Adhikari, Amrit, MD   10 mL at 05/13/23 1019   sodium chloride flush (NS) 0.9 % injection 10-40 mL  10-40 mL Intracatheter PRN Burnadette Pop, MD       sucralfate (CARAFATE) 1 GM/10ML suspension 1 g  1 g Oral TID WC & HS Burnadette Pop, MD   1 g at 05/13/23 1610     Discharge Medications: Please see discharge summary for a list of discharge medications.  Relevant Imaging Results:  Relevant Lab Results:   Additional Information SSN:[id number]  Valentina Shaggy Jennife Zaucha, LCSW

## 2023-05-13 NOTE — Evaluation (Signed)
 Occupational Therapy Evaluation Patient Details Name: Lori [name] MRN: [id number] DOB: 1970-04-24 Today's Date: 05/13/2023   History of Present Illness         Clinical Impressions Patient is a 53 year old female who was admitted for above. Patient reported having no splint at home but noted in chart to have two ordered from two different previous hospitalizations. Patient is familiar to this therapist from multiple previous admissions. Patient indicated living at home with husband and caregiver support. Patient indicated needing more help at home but not being able to get any.TOC aware at this time. Patient would benefit from 24/7 caregiver support in next level of care given how limited patient is at baseline.      If plan is discharge home, recommend the following:   Two people to help with walking and/or transfers;A lot of help with bathing/dressing/bathroom;Assistance with cooking/housework;Assistance with feeding;Direct supervision/assist for medications management;Direct supervision/assist for financial management;Assist for transportation;Help with stairs or ramp for entrance;Supervision due to cognitive status     Functional Status Assessment   Patient has had a recent decline in their functional status and demonstrates the ability to make significant improvements in function in a reasonable and predictable amount of time.       Precautions/Restrictions   Precautions Precautions: Fall Recall of Precautions/Restrictions: Intact Restrictions Weight Bearing Restrictions Per Provider Order: No     Mobility Bed Mobility Overal bed mobility: Needs Assistance             General bed mobility comments: +2 for repositioning iin bed to slide up. max A for repositioning with lateral leaning even with supports added to encourage upright posture.       Balance Overall balance assessment: Needs assistance Sitting-balance support: Feet supported,  Bilateral upper extremity supported Sitting balance-Leahy Scale: Zero           ADL either performed or assessed with clinical judgement   ADL Overall ADL's : Needs assistance/impaired Eating/Feeding: Supervision/ safety;Set up;Bed level Eating/Feeding Details (indicate cue type and reason): with education on remaining upright. patient positioned in chair position in bed with patient having no self righting strategies to maintain upright posture with lateral lean to R side. patient was able to bring to mouth with less than 5% spillage noted during session. patient able to pick up cup and bring to mouth with MI. patient educated on importance of completion of these tasks herself and remaining upright after eating to ensure all goes down appropriately. Grooming: Bed level;Maximal assistance   Upper Body Bathing: Bed level;Total assistance   Lower Body Bathing: Bed level;Total assistance   Upper Body Dressing : Bed level;Total assistance   Lower Body Dressing: Bed level;Total assistance     Toilet Transfer Details (indicate cue type and reason): deferred with patient unable to maintain sitting balance in chair position in bed and no +2 help at this time. Toileting- Clothing Manipulation and Hygiene: Bed level;Total assistance               Vision   Vision Assessment?: No apparent visual deficits            Pertinent Vitals/Pain Pain Assessment Pain Assessment: No/denies pain     Extremity/Trunk Assessment                Cognition Arousal: Alert Behavior During Therapy: Flat affect Cognition: Difficult to assess             OT - Cognition Comments: very soft spoken during session  Home Living Family/patient expects to be discharged to:: Unsure Living Arrangements: Spouse/significant other Available Help at Discharge: Family;Available PRN/intermittently;Personal care attendant Type of Home: Apartment Home  Access: Level entry     Home Layout: One level     Bathroom Shower/Tub: Chief Strategy Officer: Standard     Home Equipment: Hospital bed;Wheelchair - manual;Tub bench          Prior Functioning/Environment Prior Level of Function : Needs assist;Patient poor historian/Family not available  Cognitive Assist : ADLs (cognitive);Mobility (cognitive)     Physical Assist : ADLs (physical);Mobility (physical) Mobility (physical): Bed mobility;Transfers ADLs (physical): Grooming;Bathing;Dressing;Toileting;IADLs Mobility Comments: Pt reports standing and pivoting to and from wheelchair with assist      OT Problem List: Decreased strength;Decreased range of motion;Decreased activity tolerance;Impaired balance (sitting and/or standing);Decreased coordination;Decreased cognition;Decreased safety awareness;Impaired UE functional use;Pain   OT Treatment/Interventions: Self-care/ADL training;Therapeutic exercise;Energy conservation;DME and/or AE instruction;Splinting;Therapeutic activities;Cognitive remediation/compensation;Patient/family education;Balance training      OT Goals(Current goals can be found in the care plan section)       OT Frequency:  Min 1X/week       AM-PAC OT "6 Clicks" Daily Activity     Outcome Measure Help from another person eating meals?: A Little Help from another person taking care of personal grooming?: A Lot Help from another person toileting, which includes using toliet, bedpan, or urinal?: Total Help from another person bathing (including washing, rinsing, drying)?: Total Help from another person to put on and taking off regular upper body clothing?: Total Help from another person to put on and taking off regular lower body clothing?: Total 6 Click Score: 9   End of Session    Activity Tolerance: Patient tolerated treatment well Patient left: in bed;with call bell/phone within reach;with bed alarm set  OT Visit Diagnosis: Muscle  weakness (generalized) (M62.81);Unsteadiness on feet (R26.81)                Time: 1610-9604 OT Time Calculation (min): 21 min Charges:  OT General Charges $OT Visit: 1 Visit OT Evaluation $OT Eval Moderate Complexity: 1 Mod  Pernell Dikes OTR/L, MS Acute Rehabilitation Department Office# [phone]   Selinda Flavin 05/13/2023, 2:46 PM

## 2023-05-14 ENCOUNTER — Telehealth: Payer: Self-pay

## 2023-05-14 ENCOUNTER — Other Ambulatory Visit: Payer: Self-pay | Admitting: Family

## 2023-05-14 NOTE — Transitions of Care (Post Inpatient/ED Visit) (Signed)
   05/14/2023  Name: Lori [name] MRN: [id number] DOB: 1971-01-19  Today's TOC FU Call Status: Today's TOC FU Call Status:: Unsuccessful Call (1st Attempt) Unsuccessful Call (1st Attempt) Date: 05/14/23  Attempted to reach the patient regarding the most recent Inpatient/ED visit.  Follow Up Plan: Additional outreach attempts will be made to reach the patient to complete the Transitions of Care (Post Inpatient/ED visit) call.   Bary Leriche RN, MSN [facility], [facility] RN Care Manager Direct Dial: [phone]  Fax: [phone] Website: [email or web address]

## 2023-05-15 ENCOUNTER — Ambulatory Visit: Payer: Self-pay

## 2023-05-15 ENCOUNTER — Telehealth: Payer: Self-pay

## 2023-05-15 ENCOUNTER — Encounter: Payer: Self-pay | Admitting: Family

## 2023-05-15 ENCOUNTER — Ambulatory Visit: Admitting: Family

## 2023-05-15 VITALS — BP 121/87 | HR 99 | Temp 97.3°F | Resp 18 | Ht 62.0 in | Wt 179.4 lb

## 2023-05-15 DIAGNOSIS — E785 Hyperlipidemia, unspecified: Secondary | ICD-10-CM

## 2023-05-15 DIAGNOSIS — E0590 Thyrotoxicosis, unspecified without thyrotoxic crisis or storm: Secondary | ICD-10-CM

## 2023-05-15 DIAGNOSIS — I10 Essential (primary) hypertension: Secondary | ICD-10-CM | POA: Diagnosis not present

## 2023-05-15 DIAGNOSIS — R112 Nausea with vomiting, unspecified: Secondary | ICD-10-CM

## 2023-05-15 DIAGNOSIS — D72829 Elevated white blood cell count, unspecified: Secondary | ICD-10-CM

## 2023-05-15 DIAGNOSIS — E1169 Type 2 diabetes mellitus with other specified complication: Secondary | ICD-10-CM | POA: Diagnosis not present

## 2023-05-15 DIAGNOSIS — Z8673 Personal history of transient ischemic attack (TIA), and cerebral infarction without residual deficits: Secondary | ICD-10-CM | POA: Diagnosis not present

## 2023-05-15 DIAGNOSIS — E876 Hypokalemia: Secondary | ICD-10-CM | POA: Diagnosis not present

## 2023-05-15 MED ORDER — PANTOPRAZOLE SODIUM 40 MG PO TBEC: 40.0000 mg | DELAYED_RELEASE_TABLET | Freq: Two times a day (BID) | ORAL | 5 refills | Status: DC

## 2023-05-15 MED ORDER — FERROUS SULFATE 325 (65 FE) MG PO TABS
325.0000 mg | ORAL_TABLET | Freq: Every day | ORAL | 3 refills | Status: DC
Start: 2023-05-15 — End: 2023-06-30

## 2023-05-15 MED ORDER — DICLOFENAC SODIUM 1 % EX GEL: 1.0000 | Freq: Four times a day (QID) | CUTANEOUS | 3 refills | Status: DC | PRN

## 2023-05-15 NOTE — Telephone Encounter (Signed)
  Chief Complaint: nausea Symptoms: vomiting   Disposition: [] ED /[] Urgent Care (no appt availability in office) / [x] Appointment(In office/virtual)/ []  [facility]/ [] Home Care/ [] Refused Recommended Disposition /[] [name]/ []  Follow-up with PCP Additional Notes: Pt called with complaints of nausea/vomiting. Pt has vomited 2 times in last 24 hours. Pt was discharged from hospital on 4/8 with same concerns. Pt is very hard to understand. Speech seems very delayed. Husband tried to help RN with conversation. Husband stated this is her 'normal.' Pt has follow-up hospital visit today at 1040. RN gave care advice and husband verbalized understanding.           Copied from CRM [id number]. Topic: Clinical - Red Word Triage >> May 15, 2023  9:01 AM Hector Shade B wrote: [facility] that prompted transfer to Nurse Triage: Patient is stating that she was just discharged from the hospital yesterday, but she still continues to throw up and its getting worse. Reason for Disposition  [1] MILD or MODERATE vomiting AND [2] present > 48 hours (2 days) (Exception: Mild vomiting with associated diarrhea.)  Answer Assessment - Initial Assessment Questions 1. VOMITING SEVERITY: "How many times have you vomited in the past 24 hours?"     - MILD:  1 - 2 times/day    - MODERATE: 3 - 5 times/day, decreased oral intake without significant weight loss or symptoms of dehydration    - SEVERE: 6 or more times/day, vomits everything or nearly everything, with significant weight loss, symptoms of dehydration      2 2. ONSET: "When did the vomiting begin?"      After dinner  3. FLUIDS: "What fluids or food have you vomited up today?" "Have you been able to keep any fluids down?"     Not much  4. ABDOMEN PAIN: "Are your having any abdomen pain?" If Yes : "How bad is it and what does it feel like?" (e.g., crampy, dull, intermittent, constant)      Yes; 5. DIARRHEA: "Is there any diarrhea?" If Yes,  ask: "How many times today?"      Denies  6. CONTACTS: "Is there anyone else in the family with the same symptoms?"      No  7. CAUSE: "What do you think is causing your vomiting?"     Not sure  8. HYDRATION STATUS: "Any signs of dehydration?" (e.g., dry mouth [not only dry lips], too weak to stand) "When did you last urinate?"     Weak to stand ; last time urinated: last night  9. OTHER SYMPTOMS: "Do you have any other symptoms?" (e.g., fever, headache, vertigo, vomiting blood or coffee grounds, recent head injury)     yes  Protocols used: Vomiting-A-AH

## 2023-05-15 NOTE — Progress Notes (Signed)
 Provider: Christean Courts FNP-C  Dianca Owensby, Elijio Guadeloupe, NP  Patient Care Team: Khadim Lundberg C, NP as PCP - General (Family Medicine) Thukkani, Arun K, MD as PCP - Cardiology (Cardiology) [facility]  Extended Emergency Contact Information Primary Emergency Contact: [name],[name] Mobile Phone: [phone] Relation: Spouse Preferred language: English Interpreter needed? No Secondary Emergency Contact: [name],[name] Home Phone: [phone] Mobile Phone: [phone] Relation: Daughter Mother: [facility] Phone: [phone] Mobile Phone: [phone]  Code Status:  Full Code  Goals of care: Advanced Directive information    05/15/2023   10:49 AM  Advanced Directives  Does Patient Have a Medical Advance Directive? No  Would patient like information on creating a medical advance directive? No - Patient declined     Chief Complaint  Patient presents with   Hospitalization Follow-up    nausea/vomiting;    Discussed the use of AI scribe software for clinical note transcription with the patient, who gave verbal consent to proceed.  History of Present Illness   Lori [name] is a 53 year old female with diabetes and gastroparesis who presents with nausea and vomiting post-hospital discharge.  She was discharged from the hospital on May 13, 2023, after being admitted on May 09, 2023, for nausea and vomiting. Since discharge, she has experienced nausea and vomiting again last night and this morning. During her hospital stay, she was treated with an insulin drip due to diabetic ketoacidosis (DKA), although her blood sugars were not consistently high, with a peak of 340 mg/dL before hospitalization.  She has a history of diabetes and is currently managing her condition with Humalog insulin, administering 7 units three times daily with meals, and adjusting the dose based on her blood sugar levels. She also uses Lantus,  administering 14 units. She uses a sliding scale for additional Humalog doses when her blood sugar exceeds 300 mg/dL, taking 10 units in such cases.  She has a history of gastroparesis and severe acid reflux, for which she is taking Reglan (metoclopramide) 10 mg every eight hours as needed, and Protonix (pantoprazole) twice daily. She also takes Carafate (sucralfate) 1 gram four times daily. She has difficulty swallowing large potassium pills, which she takes twice daily due to low potassium levels noted during her recent hospitalization.  She is managing iron deficiency anemia, for which she received an iron infusion during her hospital stay. She continues to take ferrous sulfate 325 mg with breakfast. Her recent lab results showed low iron levels, with a total iron binding capacity of 241 and low saturation, but normal ferritin levels.  Her potassium level was low at 2.9 mEq/L, likely due to vomiting and dehydration. She is on potassium supplements to correct this, taking 20 mEq twice daily.  She has assistance at home daily.   Past Medical History:  Diagnosis Date   CAD (coronary artery disease)    Depression    Diabetes mellitus without complication (HCC)    History of CT scan    History of mammogram    History of MRI    Hypertension    Pheochromocytoma    Reversible cerebrovascular vasoconstriction syndrome    Stroke [facility])    Thyroid disease    Past Surgical History:  Procedure Laterality Date   ABDOMINAL HYSTERECTOMY     BIOPSY  02/06/2023   Procedure: BIOPSY;  Surgeon: Ace Holder, MD;  Location: [facility] ENDOSCOPY;  Service: Gastroenterology;;   CESAREAN SECTION     3   CORONARY ARTERY BYPASS GRAFT  ESOPHAGOGASTRODUODENOSCOPY (EGD) WITH PROPOFOL N/A 02/06/2023   Procedure: ESOPHAGOGASTRODUODENOSCOPY (EGD) WITH PROPOFOL;  Surgeon: Ace Holder, MD;  Location: [facility] ENDOSCOPY;  Service: Gastroenterology;  Laterality: N/A;   Right adrenal gland removal for  pheochromocytoma Right     Allergies  Allergen Reactions   Asa [Aspirin] Anaphylaxis, Swelling and Other (See Comments)    Throat closes   Contrast Media [Iodinated Contrast Media] Anaphylaxis and Swelling   Imitrex [Sumatriptan] Anaphylaxis   Ms Contin [Morphine] Anaphylaxis and Swelling   Penicillins Swelling and Other (See Comments)    Mouth and eyes swell   Chlorhexidine Gluconate [Chlorhexidine] Itching and Rash   Firvanq [Vancomycin] Other (See Comments)    Red Man's Syndrome   Cipro [Ciprofloxacin Hcl] Nausea And Vomiting   Nitrofuran Derivatives Anxiety   Wound Dressing Adhesive Itching and Rash    Outpatient Encounter Medications as of 05/15/2023  Medication Sig   acetaminophen (TYLENOL) 325 MG tablet Take 2 tablets (650 mg total) by mouth every 6 (six) hours as needed for mild pain (pain score 1-3) (or Fever >/= 101).   albuterol (PROVENTIL) (2.5 MG/3ML) 0.083% nebulizer solution Take 3 mLs (2.5 mg total) by nebulization every 6 (six) hours as needed for wheezing or shortness of breath.   albuterol (VENTOLIN HFA) 108 (90 Base) MCG/ACT inhaler INHALE 2 PUFFS BY MOUTH EVERY 6 HOURS AS NEEDED FOR WHEEZING AND FOR SHORTNESS OF BREATH Strength: 108 (90 Base) MCG/ACT   atorvastatin (LIPITOR) 80 MG tablet TAKE 1 TABLET (80 MG TOTAL) BY MOUTH DAILY.   baclofen (LIORESAL) 10 MG tablet Take 1 tablet (10 mg total) by mouth 3 (three) times daily.   clopidogrel (PLAVIX) 75 MG tablet TAKE 1 TABLET BY MOUTH DAILY   Continuous Glucose Receiver (FREESTYLE LIBRE 2 READER) DEVI 1 Device by Does not apply route daily. E11.69   Continuous Glucose Sensor (FREESTYLE LIBRE 14 DAY SENSOR) MISC 1 each by Does not apply route as needed. DX: E11.69   cyclobenzaprine (FLEXERIL) 5 MG tablet Take 1 tablet (5 mg total) by mouth 3 (three) times daily as needed for muscle spasms.   escitalopram (LEXAPRO) 20 MG tablet Take 1 tablet (20 mg total) by mouth every morning.   ezetimibe (ZETIA) 10 MG tablet TAKE 1  TABLET BY MOUTH DAILY   gabapentin (NEURONTIN) 100 MG capsule Take 100 mg by mouth 3 (three) times daily.   HUMALOG KWIKPEN 100 UNIT/ML KwikPen INJECT 4 UNITS SUBCUTANEOUSLY THREE TIMES A DAY WITH MEALS (Patient taking differently: Inject 4 Units into the skin with breakfast, with lunch, and with evening meal.)   insulin glargine (LANTUS SOLOSTAR) 100 UNIT/ML Solostar Pen Inject 10 Units into the skin at bedtime.   insulin lispro (HUMALOG) 100 UNIT/ML injection Sliding scale AC, HS CBG 121 - 150: 2 units CBG 151 - 200: 3 units CBG 201 - 250: 5 units CBG 251 - 300: 8 units CBG 301 - 350: 11 units CBG 351 - 400: 15 units CBG > 400: call MD   methimazole (TAPAZOLE) 5 MG tablet TAKE 1 TABLET BY MOUTH EVERY  MORNING   metoCLOPramide (REGLAN) 10 MG tablet Take 1 tablet (10 mg total) by mouth every 8 (eight) hours as needed for refractory nausea / vomiting.   metoprolol tartrate (LOPRESSOR) 50 MG tablet Take 1 tablet (50 mg total) by mouth 2 (two) times daily.   potassium chloride SA (KLOR-CON M) 20 MEQ tablet Take 1 tablet (20 mEq total) by mouth 2 (two) times daily for 7 days.   ranolazine (  RANEXA) 500 MG 12 hr tablet Take 1 tablet (500 mg total) by mouth 2 (two) times daily.   rivaroxaban (XARELTO) 10 MG TABS tablet TAKE 1 TABLET BY MOUTH DAILY   senna-docusate (SENOKOT-S) 8.6-50 MG tablet Take 1 tablet by mouth 2 (two) times daily.   sucralfate (CARAFATE) 1 g tablet Take 1 tablet (1 g total) by mouth 4 (four) times daily for 14 days.   tamsulosin (FLOMAX) 0.4 MG CAPS capsule Take 1 capsule (0.4 mg total) by mouth at bedtime.   topiramate (TOPAMAX) 25 MG tablet Take 1 tablet (25 mg total) by mouth 2 (two) times daily.   [DISCONTINUED] diclofenac Sodium (VOLTAREN) 1 % GEL Apply 1 Application topically 4 (four) times daily as needed (pain).   [DISCONTINUED] ferrous sulfate 325 (65 FE) MG tablet Take 1 tablet (325 mg total) by mouth daily with breakfast.   [DISCONTINUED] pantoprazole (PROTONIX) 40 MG  tablet Take 1 tablet (40 mg total) by mouth 2 (two) times daily.   diclofenac Sodium (VOLTAREN) 1 % GEL Apply 1 Application topically 4 (four) times daily as needed (pain).   ferrous sulfate 325 (65 FE) MG tablet Take 1 tablet (325 mg total) by mouth daily with breakfast.   pantoprazole (PROTONIX) 40 MG tablet Take 1 tablet (40 mg total) by mouth 2 (two) times daily.   sorbitol 70 % SOLN Take 30 mLs by mouth daily as needed for moderate constipation. (Patient not taking: Reported on 05/15/2023)   No facility-administered encounter medications on file as of 05/15/2023.    Review of Systems  Immunization History  Administered Date(s) Administered   Influenza-Unspecified 11/04/2016   PPD Test 03/19/2016   Pfizer(Comirnaty)Fall Seasonal Vaccine 12 years and older 09/29/2019, 03/31/2020   Tdap 02/07/2014   Pertinent  Health Maintenance Due  Topic Date Due   FOOT EXAM  Never done   Colonoscopy  Never done   MAMMOGRAM  Never done   INFLUENZA VACCINE  09/05/2023   HEMOGLOBIN A1C  10/26/2023   OPHTHALMOLOGY EXAM  02/19/2024      11/01/2022    3:05 PM 12/24/2022   10:28 AM 12/31/2022    8:49 AM 04/03/2023    2:19 PM 05/15/2023   10:48 AM  Fall Risk  Falls in the past year? 0 0 1 0 0  Was there an injury with Fall?  0 0 0 0  Fall Risk Category Calculator  0 1 0 0  Patient at Risk for Falls Due to   History of fall(s) Impaired balance/gait;Impaired mobility Impaired balance/gait;Impaired mobility  Fall risk Follow up   Falls evaluation completed Falls evaluation completed Falls evaluation completed   Functional Status Survey:    Vitals:   05/15/23 1031  BP: 121/87  Pulse: 99  Resp: 18  Temp: (!) 97.3 F (36.3 C)  SpO2: 97%  Weight: 179 lb 6.4 oz (81.4 kg)  Height: 5\' 2"  (1.575 m)   Body mass index is 32.81 kg/m. Physical Exam GENERAL: Alert, cooperative, well developed, no acute distress. HEENT: Normocephalic, normal oropharynx, moist mucous membranes, tympanic membranes  normal, nose normal, no sinus tenderness. CHEST: Clear to auscultation bilaterally, no wheezes, rhonchi, or crackles. CARDIOVASCULAR: Normal heart rate and rhythm, S1 and S2 normal without murmurs. ABDOMEN: Soft, non-tender, non-distended, without organomegaly, normal bowel sounds. EXTREMITIES: No cyanosis, edema, or leg swelling. NEUROLOGICAL: Cranial nerves grossly intact, moves all extremities without gross motor or sensory deficit except left hand contracture. SKIN: No sores, wounds, or rash on skin.   Labs reviewed: Recent Labs  12/02/22 0530 12/05/22 0500 01/02/23 0440 01/03/23 1104 04/29/23 0519 04/30/23 0520 05/11/23 0851 05/12/23 0529 05/13/23 0453  NA 136   < > 130*   < > 144   < > 135 134* 131*  K 4.0   < > 3.3*   < > 3.8   < > 2.9* 3.7 3.2*  CL 105   < > 97*   < > 110   < > 102 105 102  CO2 23   < > 20*   < > 25   < > 22 22 23   GLUCOSE 196*   < > 235*   < > 103*   < > 204* 144* 180*  BUN 20   < > 8   < > 25*   < > 10 7 10   CREATININE 0.94   < > 0.77   < > 1.46*   < > 0.72 0.69 0.80  CALCIUM 8.9   < > 8.5*   < > 9.1   < > 8.4* 8.0* 7.9*  MG 1.7  --  1.7   < > 1.7  --  1.4*  --  1.7  PHOS 4.1  --  3.3  --  3.5  --   --   --   --    < > = values in this interval not displayed.   Recent Labs    05/08/23 1434 05/09/23 1948 05/10/23 0603  AST 16 27 16   ALT 18 20 22   ALKPHOS 76 83 81  BILITOT 0.5 1.2 0.8  PROT 7.9 8.2* 8.2*  ALBUMIN 3.7 3.8 3.6   Recent Labs    04/03/23 1505 04/09/23 1435 04/25/23 1430 04/27/23 1542 05/09/23 1948 05/10/23 0603 05/11/23 0136 05/11/23 0851 05/12/23 0529  WBC 7.6   < > 8.3   < > 20.1*   < > 13.0* 12.9* 9.3  NEUTROABS 4,241  --  5,437  --  15.8*  --   --   --   --   HGB 12.8   < > 14.4   < > 13.4   < > 9.0* 10.2* 8.7*  HCT 40.8   < > 46.3*   < > 41.5   < > 27.5* 30.6* 25.8*  MCV 82.4   < > 84.3   < > 81.2   < > 81.4 79.3* 81.1  PLT 366   < > 281   < > 494*   < > 367 390 301   < > = values in this interval not displayed.    Lab Results  Component Value Date   TSH 0.437 05/11/2023   Lab Results  Component Value Date   HGBA1C 10.0 (H) 04/25/2023   Lab Results  Component Value Date   CHOL 273 (H) 04/25/2023   HDL 55 04/25/2023   LDLCALC 187 (H) 04/25/2023   TRIG 165 (H) 04/25/2023   CHOLHDL 5.0 (H) 04/25/2023    Significant Diagnostic Results in last 30 days:  DG CHEST PORT 1 VIEW Result Date: 05/10/2023 CLINICAL DATA:  Status post PICC placement. EXAM: PORTABLE CHEST 1 VIEW COMPARISON:  Earlier radiograph dated 05/10/2023. FINDINGS: Right-sided PICC with tip close to the cavoatrial junction. No focal consolidation, pleural effusion or pneumothorax. The cardiac silhouette is within limits. Median sternotomy wires and CABG vascular clips. No acute osseous pathology. IMPRESSION: Right-sided PICC with tip close to the cavoatrial junction. Electronically Signed   By: Angus Bark M.D.   On: 05/10/2023 20:09   US  EKG Site Rite  Result Date: 05/10/2023 If Site Rite image not attached, placement could not be confirmed due to current cardiac rhythm.  DG CHEST PORT 1 VIEW Result Date: 05/10/2023 CLINICAL DATA:  PICC line placement EXAM: PORTABLE CHEST 1 VIEW COMPARISON:  04/27/2023 FINDINGS: Sternotomy. No acute airspace disease, pleural effusion, or pneumothorax. A right azygous lobe/fissure is noted. Right upper extremity central venous catheter tip with abnormal orientation, curved and directed superiorly in the region of the right azygos fissure. IMPRESSION: Right upper extremity central venous catheter tip with abnormal orientation, curved and directed superiorly in the region of the right azygos fissure. Recommend repositioning. These results will be called to the ordering clinician or representative by the Radiologist Assistant, and communication documented in the PACS or [employer]. Electronically Signed   By: Esmeralda Hedge M.D.   On: 05/10/2023 18:02   US  EKG SITE RITE Result Date: 05/10/2023 If Site  Rite image not attached, placement could not be confirmed due to current cardiac rhythm.  DG Chest Port 1 View Result Date: 04/27/2023 CLINICAL DATA:  Lactic acidosis EXAM: PORTABLE CHEST 1 VIEW COMPARISON:  04/09/2023 FINDINGS: Post sternotomy changes. No acute airspace disease or effusion. Stable cardiomediastinal silhouette with aortic atherosclerosis. No pneumothorax IMPRESSION: No active disease. Electronically Signed   By: Esmeralda Hedge M.D.   On: 04/27/2023 18:22   CT ABDOMEN PELVIS WO CONTRAST Result Date: 04/27/2023 CLINICAL DATA:  Abdominal pain EXAM: CT ABDOMEN AND PELVIS WITHOUT CONTRAST TECHNIQUE: Multidetector CT imaging of the abdomen and pelvis was performed following the standard protocol without IV contrast. RADIATION DOSE REDUCTION: This exam was performed according to the departmental dose-optimization program which includes automated exposure control, adjustment of the mA and/or kV according to patient size and/or use of iterative reconstruction technique. COMPARISON:  CT 02/04/2023 FINDINGS: Lower chest: Lung bases are clear. Coronary vascular calcifications. Small hiatal hernia Hepatobiliary: No focal liver abnormality is seen. No gallstones, gallbladder wall thickening, or biliary dilatation. Pancreas: Coarse pancreatic calcifications suggestive of chronic pancreatitis. No acute inflammation Spleen: Normal in size without focal abnormality. Adrenals/Urinary Tract: History of right adrenalectomy. Left adrenal gland demonstrates mild diffuse thickening but no mass. No hydronephrosis. The bladder is unremarkable. Stomach/Bowel: Stomach is within normal limits. Appendix appears normal. No evidence of bowel wall thickening, distention, or inflammatory changes. Vascular/Lymphatic: Aortic atherosclerosis. No enlarged abdominal or pelvic lymph nodes. Reproductive: Hysterectomy.  No adnexal mass Other: Negative for pelvic effusion or free air. Lobulated soft tissue density at the low midline  anterior pelvis is chronic and may relate to chronic scarring. Musculoskeletal: No acute osseous abnormality IMPRESSION: 1. No CT evidence for acute intra-abdominal or pelvic abnormality. 2. Small hiatal hernia. 3. Findings suggestive of chronic pancreatitis. No acute inflammatory changes about the pancreas 4. Aortic atherosclerosis. Electronically Signed   By: Esmeralda Hedge M.D.   On: 04/27/2023 17:17   DG Wrist Complete Left Result Date: 04/27/2023 CLINICAL DATA:  Wrist pain EXAM: LEFT WRIST - COMPLETE 3+ VIEW COMPARISON:  12/20/2021 FINDINGS: Bony demineralization. 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 The orientation of the scaphoid contributes to bony superimposition and makes it difficult to fully assess. No definite fracture identified. IMPRESSION: 1. 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 2. Bony demineralization. Electronically Signed   By: Freida Jes M.D.   On: 04/27/2023 16:59    Assessment/Plan  Diabetic Ketoacidosis (DKA) Hospitalized for DKA likely due to elevated blood glucose levels, with a blood sugar level of 340  mg/dL prior to hospitalization. Treated with an insulin drip in the hospital. DKA associated with nausea, vomiting, and tachycardia, likely due to dehydration. Continues to experience nausea and vomiting post-discharge. Insulin therapy is crucial to manage blood glucose levels and prevent recurrence of DKA. - Continue insulin therapy with Humalog 7 units three times daily with meals and adjust based on sliding scale - continue on 10 units of Humalog if blood glucose is greater than 300 mg/dL - continue on 15 units of Humalog if blood glucose is greater than 400 mg/dL and contact the clinic  Gastroparesis Gastroparesis contributing to nausea and vomiting. Currently taking Reglan (metoclopramide) to manage symptoms. Reglan is effective in enhancing  gastric motility and reducing nausea. - Continue Reglan (metoclopramide) 10 mg every 8 hours as needed, 30 minutes before meals - Continue to follow-up with gastroenterology for further evaluation and management  Iron Deficiency Anemia Low iron levels indicated by a total iron level of less than 14, low total iron binding capacity, and low saturation. Received an iron infusion in the hospital and continuing oral iron supplementation. Monitoring is necessary to ensure improvement in iron levels. - Continue oral iron supplementation with ferrous sulfate 325 mg with breakfast - Recheck iron levels to monitor improvement  Hypokalemia Potassium level was 2.9 mEq/L, below the normal range. Taking potassium supplements to address this deficiency. Adequate potassium levels are essential to prevent complications such as muscle weakness and cardiac issues. - Continue potassium supplementation 20 mEq twice daily - Recheck potassium levels - Encourage dietary intake of potassium-rich foods such as bananas and dark leafy greens  Acid Reflux Severe acid reflux, currently taking Protonix (pantoprazole) for management. Reports difficulty swallowing large potassium pills, which may exacerbate reflux symptoms. Protonix is effective in reducing stomach acid and managing reflux symptoms. - Continue Protonix (pantoprazole) twice daily - Provide refills for Protonix - Consider alternative formulations for potassium supplementation if swallowing difficulties persist  General Health Maintenance On multiple medications for chronic conditions, including diabetes, migraines, and hyperlipidemia. Advised to maintain a healthy lifestyle to support overall health. Regular medication review is necessary to ensure optimal management of conditions. - Encourage physical activity, such as walking, as weather permits - Ensure medication adherence and review medication list regularly  Follow-up Requires follow-up for ongoing  management of her conditions and to monitor laboratory values. Home health assistance is in  place to support daily activities and medication management. - Schedule follow-up appointment with [facility] - Recheck CBC, iron levels, and potassium levels - Ensure home health assistance continues as needed   Family/ staff Communication: Reviewed plan of care with patient and husband verbalized understanding  Labs/tests ordered:  -BMP - CBC with differential  Next Appointment: Return if symptoms worsen or fail to improve.  Total time: 30 minutes. Greater than 50% of total time spent doing patient education regarding T2DM, GERD, hypokalemia, gastroparesis, iron deficiency anemia, DKA and health maintenance including symptom/medication management.   Estil Heman, NP

## 2023-05-15 NOTE — Transitions of Care (Post Inpatient/ED Visit) (Signed)
   05/15/2023  Name: Lori [name] MRN: [id number] DOB: 01-20-1971  Today's TOC FU Call Status: Today's TOC FU Call Status:: Unsuccessful Call (2nd Attempt) Unsuccessful Call (2nd Attempt) Date: 05/15/23  Attempted to reach the patient regarding the most recent Inpatient/ED visit.  Follow Up Plan: Additional outreach attempts will be made to reach the patient to complete the Transitions of Care (Post Inpatient/ED visit) call.   Bary Leriche RN, MSN [facility], [facility] RN Care Manager Direct Dial: [phone]  Fax: [phone] Website: [email or web address]

## 2023-05-15 NOTE — Telephone Encounter (Signed)
 Metoprolol 100 mg is not on active medication list Olmesartan is not on active medication list   Both refills denied as it is inappropriate to refill medication not on active medication list

## 2023-05-16 ENCOUNTER — Telehealth: Payer: Self-pay

## 2023-05-16 DIAGNOSIS — I509 Heart failure, unspecified: Secondary | ICD-10-CM | POA: Diagnosis not present

## 2023-05-16 DIAGNOSIS — I110 Hypertensive heart disease with heart failure: Secondary | ICD-10-CM | POA: Diagnosis not present

## 2023-05-16 DIAGNOSIS — Z794 Long term (current) use of insulin: Secondary | ICD-10-CM | POA: Diagnosis not present

## 2023-05-16 DIAGNOSIS — K5901 Slow transit constipation: Secondary | ICD-10-CM | POA: Diagnosis not present

## 2023-05-16 DIAGNOSIS — E0590 Thyrotoxicosis, unspecified without thyrotoxic crisis or storm: Secondary | ICD-10-CM | POA: Diagnosis not present

## 2023-05-16 DIAGNOSIS — E1169 Type 2 diabetes mellitus with other specified complication: Secondary | ICD-10-CM | POA: Diagnosis not present

## 2023-05-16 DIAGNOSIS — E785 Hyperlipidemia, unspecified: Secondary | ICD-10-CM | POA: Diagnosis not present

## 2023-05-16 DIAGNOSIS — J4489 Other specified chronic obstructive pulmonary disease: Secondary | ICD-10-CM | POA: Diagnosis not present

## 2023-05-16 DIAGNOSIS — G4733 Obstructive sleep apnea (adult) (pediatric): Secondary | ICD-10-CM | POA: Diagnosis not present

## 2023-05-16 DIAGNOSIS — J4520 Mild intermittent asthma, uncomplicated: Secondary | ICD-10-CM | POA: Diagnosis not present

## 2023-05-16 DIAGNOSIS — Z8616 Personal history of COVID-19: Secondary | ICD-10-CM | POA: Diagnosis not present

## 2023-05-16 DIAGNOSIS — Z7902 Long term (current) use of antithrombotics/antiplatelets: Secondary | ICD-10-CM | POA: Diagnosis not present

## 2023-05-16 DIAGNOSIS — R000 Tachycardia, unspecified: Secondary | ICD-10-CM | POA: Diagnosis not present

## 2023-05-16 DIAGNOSIS — K219 Gastro-esophageal reflux disease without esophagitis: Secondary | ICD-10-CM | POA: Diagnosis not present

## 2023-05-16 DIAGNOSIS — N179 Acute kidney failure, unspecified: Secondary | ICD-10-CM | POA: Diagnosis not present

## 2023-05-16 DIAGNOSIS — R040 Epistaxis: Secondary | ICD-10-CM | POA: Diagnosis not present

## 2023-05-16 DIAGNOSIS — Z7901 Long term (current) use of anticoagulants: Secondary | ICD-10-CM | POA: Diagnosis not present

## 2023-05-16 DIAGNOSIS — K922 Gastrointestinal hemorrhage, unspecified: Secondary | ICD-10-CM | POA: Diagnosis not present

## 2023-05-16 DIAGNOSIS — I2510 Atherosclerotic heart disease of native coronary artery without angina pectoris: Secondary | ICD-10-CM | POA: Diagnosis not present

## 2023-05-16 DIAGNOSIS — Z556 Problems related to health literacy: Secondary | ICD-10-CM | POA: Diagnosis not present

## 2023-05-16 LAB — CBC WITH DIFFERENTIAL/PLATELET
Absolute Lymphocytes: 2116 {cells}/uL (ref 850–3900)
Absolute Monocytes: 508 {cells}/uL (ref 200–950)
Basophils Absolute: 41 {cells}/uL (ref 0–200)
Basophils Relative: 0.5 %
Eosinophils Absolute: 172 {cells}/uL (ref 15–500)
Eosinophils Relative: 2.1 %
HCT: 37.4 % (ref 35.0–45.0)
Hemoglobin: 11.6 g/dL — ABNORMAL LOW (ref 11.7–15.5)
MCH: 26 pg — ABNORMAL LOW (ref 27.0–33.0)
MCHC: 31 g/dL — ABNORMAL LOW (ref 32.0–36.0)
MCV: 83.9 fL (ref 80.0–100.0)
MPV: 10.5 fL (ref 7.5–12.5)
Monocytes Relative: 6.2 %
Neutro Abs: 5363 {cells}/uL (ref 1500–7800)
Neutrophils Relative %: 65.4 %
Platelets: 502 10*3/uL — ABNORMAL HIGH (ref 140–400)
RBC: 4.46 10*6/uL (ref 3.80–5.10)
RDW: 15.4 % — ABNORMAL HIGH (ref 11.0–15.0)
Total Lymphocyte: 25.8 %
WBC: 8.2 10*3/uL (ref 3.8–10.8)

## 2023-05-16 LAB — BASIC METABOLIC PANEL WITH GFR
BUN/Creatinine Ratio: 9 (calc) (ref 6–22)
BUN: 6 mg/dL — ABNORMAL LOW (ref 7–25)
CO2: 22 mmol/L (ref 20–32)
Calcium: 9.4 mg/dL (ref 8.6–10.4)
Chloride: 103 mmol/L (ref 98–110)
Creat: 0.65 mg/dL (ref 0.50–1.03)
Glucose, Bld: 195 mg/dL — ABNORMAL HIGH (ref 65–99)
Potassium: 4.7 mmol/L (ref 3.5–5.3)
Sodium: 134 mmol/L — ABNORMAL LOW (ref 135–146)
eGFR: 107 mL/min/{1.73_m2} (ref >=60)

## 2023-05-16 NOTE — Transitions of Care (Post Inpatient/ED Visit) (Signed)
   05/16/2023  Name: Lori [name] MRN: [id number] DOB: 24-May-1970  Today's TOC FU Call Status: Today's TOC FU Call Status:: Successful TOC FU Call Completed TOC FU Call Complete Date: 05/16/23 Patient's Name and Date of Birth confirmed.  Transition Care Management Follow-up Telephone Call How have you been since you were released from the hospital?: Better (reports that she has some nausea but she is ok) Any questions or concerns?: No  Items Reviewed: Did you receive and understand the discharge instructions provided?: Yes Medications obtained,verified, and reconciled?: No Medications Not Reviewed Reasons:: Other: (patient reports that she reviewed all her medications with her MD yesterday and declined review today.) Any new allergies since your discharge?: No Type of Diet Ordered:: low sodium diet Do you have support at home?: Yes Name of Support/Comfort Primary Source: husband and aid  Medications Reviewed Today: Medications Reviewed Today   Medications were not reviewed in this encounter     Home Care and Equipment/Supplies: Were Home Health Services Ordered?: Yes Name of Home Health Agency:: [facility] Has Agency set up a time to come to your home?: Yes First Home Health Visit Date: 05/16/23 Any new equipment or medical supplies ordered?: No  Functional Questionnaire: Do you need assistance with bathing/showering or dressing?: Yes (aid) Do you need assistance with meal preparation?: Yes (aid) Do you need assistance with eating?: No Do you have difficulty maintaining continence: Yes (bed bound, purewick) Do you need assistance with getting out of bed/getting out of a chair/moving?: Yes (aid) Do you have difficulty managing or taking your medications?: Yes (family)  Follow up appointments reviewed: PCP Follow-up appointment confirmed?: Yes Date of PCP follow-up appointment?: 05/16/23 Follow-up Provider: [facility] Follow-up  appointment confirmed?: Yes Date of Specialist follow-up appointment?: 05/20/23 Follow-Up Specialty Provider:: Dia McCaughan Do you need transportation to your follow-up appointment?: No Do you understand care options if your condition(s) worsen?: Yes-patient verbalized understanding   Patient reports that she had some nausea this am but is feeling better. Very difficult to understand on the phone.  Patient request not to finish assessment questions as she is tired.  Reports she saw PCP yesterday and has all questions answered. Patient has PCS services 7 days a week.   Reviewed and offer 30 day TOC program and patient declined.  Encouraged patient to call me back if needed Lonia Chimera, RN, BSN, [employer]- Transition of Care Team.  [facility] [phone]

## 2023-05-18 DIAGNOSIS — Z7902 Long term (current) use of antithrombotics/antiplatelets: Secondary | ICD-10-CM | POA: Diagnosis not present

## 2023-05-18 DIAGNOSIS — K922 Gastrointestinal hemorrhage, unspecified: Secondary | ICD-10-CM | POA: Diagnosis not present

## 2023-05-18 DIAGNOSIS — J4489 Other specified chronic obstructive pulmonary disease: Secondary | ICD-10-CM | POA: Diagnosis not present

## 2023-05-18 DIAGNOSIS — Z556 Problems related to health literacy: Secondary | ICD-10-CM | POA: Diagnosis not present

## 2023-05-18 DIAGNOSIS — E1169 Type 2 diabetes mellitus with other specified complication: Secondary | ICD-10-CM | POA: Diagnosis not present

## 2023-05-18 DIAGNOSIS — J4520 Mild intermittent asthma, uncomplicated: Secondary | ICD-10-CM | POA: Diagnosis not present

## 2023-05-18 DIAGNOSIS — Z7901 Long term (current) use of anticoagulants: Secondary | ICD-10-CM | POA: Diagnosis not present

## 2023-05-18 DIAGNOSIS — E785 Hyperlipidemia, unspecified: Secondary | ICD-10-CM | POA: Diagnosis not present

## 2023-05-18 DIAGNOSIS — I509 Heart failure, unspecified: Secondary | ICD-10-CM | POA: Diagnosis not present

## 2023-05-18 DIAGNOSIS — N179 Acute kidney failure, unspecified: Secondary | ICD-10-CM | POA: Diagnosis not present

## 2023-05-18 DIAGNOSIS — Z794 Long term (current) use of insulin: Secondary | ICD-10-CM | POA: Diagnosis not present

## 2023-05-18 DIAGNOSIS — I110 Hypertensive heart disease with heart failure: Secondary | ICD-10-CM | POA: Diagnosis not present

## 2023-05-18 DIAGNOSIS — K5901 Slow transit constipation: Secondary | ICD-10-CM | POA: Diagnosis not present

## 2023-05-18 DIAGNOSIS — G4733 Obstructive sleep apnea (adult) (pediatric): Secondary | ICD-10-CM | POA: Diagnosis not present

## 2023-05-18 DIAGNOSIS — K219 Gastro-esophageal reflux disease without esophagitis: Secondary | ICD-10-CM | POA: Diagnosis not present

## 2023-05-18 DIAGNOSIS — Z8616 Personal history of COVID-19: Secondary | ICD-10-CM | POA: Diagnosis not present

## 2023-05-18 DIAGNOSIS — E0590 Thyrotoxicosis, unspecified without thyrotoxic crisis or storm: Secondary | ICD-10-CM | POA: Diagnosis not present

## 2023-05-18 DIAGNOSIS — R040 Epistaxis: Secondary | ICD-10-CM | POA: Diagnosis not present

## 2023-05-18 DIAGNOSIS — R000 Tachycardia, unspecified: Secondary | ICD-10-CM | POA: Diagnosis not present

## 2023-05-18 DIAGNOSIS — I2510 Atherosclerotic heart disease of native coronary artery without angina pectoris: Secondary | ICD-10-CM | POA: Diagnosis not present

## 2023-05-19 ENCOUNTER — Telehealth: Payer: Self-pay

## 2023-05-19 NOTE — Telephone Encounter (Signed)
 Patient was discharged from Hospital with scheduled Humalog 7 units and additional per sliding scale,If confusing recommend using Humalog sliding scale Humalog and discontinue Humalog 7 units three times with meals.

## 2023-05-19 NOTE — Telephone Encounter (Signed)
 Copied from CRM [id number]. Topic: General - Other >> May 19, 2023  9:26 AM Lori [name] wrote: Reason for CRM: Patient called in today and said they normally send her diapers every month and she has not received any, I could barely understand the patient and she was there alone with no one to help her, patient callback number are [phone].

## 2023-05-19 NOTE — Telephone Encounter (Signed)
 Copied from CRM [id number]. Topic: Clinical - Medication Question >> May 19, 2023  3:00 PM Corin V wrote: Reason for CRM: Patient has questions about medications for Dinah's nurse, but did not want tot go into details while on with agent. Questions were regarding Humalog.

## 2023-05-19 NOTE — Telephone Encounter (Signed)
 Please clarify how patient should be taking Humalog. Patient instructions and signature on medication contradict one another. Message routed to PCP Ngetich, Elijio Guadeloupe, NP

## 2023-05-19 NOTE — Telephone Encounter (Signed)
 Patient called back and she states that she uses [employer] for her depends. I gave her husband the number of the company to reach out to them and ask why her order hasn't been delivered yet [phone]. They states that they will call back if there are any further issues or concerns. She also mentioned that she needs a smaller size because she's lost weight. This was mentioned in provider notes 04/25/2023. Were new orders placed for patient? Message routed to PCP Ngetich, Elijio Guadeloupe, NP

## 2023-05-20 ENCOUNTER — Encounter: Payer: Self-pay | Admitting: Podiatry

## 2023-05-20 ENCOUNTER — Ambulatory Visit (INDEPENDENT_AMBULATORY_CARE_PROVIDER_SITE_OTHER): Admitting: Podiatry

## 2023-05-20 DIAGNOSIS — Q809 Congenital ichthyosis, unspecified: Secondary | ICD-10-CM | POA: Insufficient documentation

## 2023-05-20 DIAGNOSIS — M79674 Pain in right toe(s): Secondary | ICD-10-CM

## 2023-05-20 DIAGNOSIS — M79675 Pain in left toe(s): Secondary | ICD-10-CM | POA: Diagnosis not present

## 2023-05-20 DIAGNOSIS — H0589 Other disorders of orbit: Secondary | ICD-10-CM | POA: Insufficient documentation

## 2023-05-20 DIAGNOSIS — B351 Tinea unguium: Secondary | ICD-10-CM

## 2023-05-20 DIAGNOSIS — E1140 Type 2 diabetes mellitus with diabetic neuropathy, unspecified: Secondary | ICD-10-CM

## 2023-05-20 DIAGNOSIS — Z91041 Radiographic dye allergy status: Secondary | ICD-10-CM | POA: Insufficient documentation

## 2023-05-20 NOTE — Telephone Encounter (Signed)
 Message routed to [employer] coverage for tomorrow 05/21/2023.

## 2023-05-20 NOTE — Progress Notes (Unsigned)
 Subjective:  Patient ID: Lori [name], female    DOB: [birth year],  MRN: [id number]  Lori [name] presents to clinic today for:  Chief Complaint  Patient presents with   Debridement    Requesting toenail trim - diabetic - last A1c was 10.0, blood thinner   New Patient (Initial Visit)   Patient notes nails are thick, discolored, elongated and painful in shoegear when trying to ambulate.  There is a caregiver with the patient today.  She states that she resides at a facility that she has been living at for the past year.  Her last A1c was 10 on 04/25/2023.  She notes numbness along the outer 3 toes on the right foot.  PCP is Ngetich, Donalee Citrin, NP.  Past Medical History:  Diagnosis Date   CAD (coronary artery disease)    Depression    Diabetes mellitus without complication (HCC)    History of CT scan    History of mammogram    History of MRI    Hypertension    Pheochromocytoma    Reversible cerebrovascular vasoconstriction syndrome    Stroke [facility])    Thyroid disease     Past Surgical History:  Procedure Laterality Date   ABDOMINAL HYSTERECTOMY     BIOPSY  02/06/2023   Procedure: BIOPSY;  Surgeon: Benancio Deeds, MD;  Location: [facility] ENDOSCOPY;  Service: Gastroenterology;;   CESAREAN SECTION     3   CORONARY ARTERY BYPASS GRAFT     ESOPHAGOGASTRODUODENOSCOPY (EGD) WITH PROPOFOL N/A 02/06/2023   Procedure: ESOPHAGOGASTRODUODENOSCOPY (EGD) WITH PROPOFOL;  Surgeon: Benancio Deeds, MD;  Location: [facility] ENDOSCOPY;  Service: Gastroenterology;  Laterality: N/A;   Right adrenal gland removal for pheochromocytoma Right     Allergies  Allergen Reactions   Asa [Aspirin] Anaphylaxis, Swelling and Other (See Comments)    Throat closes   Contrast Media [Iodinated Contrast Media] Anaphylaxis and Swelling   Imitrex [Sumatriptan] Anaphylaxis   Ms Contin [Morphine] Anaphylaxis and Swelling   Penicillins Swelling and Other (See Comments)     Mouth and eyes swell   Chlorhexidine Gluconate [Chlorhexidine] Itching and Rash   Firvanq [Vancomycin] Other (See Comments)    Red Man's Syndrome   Cipro [Ciprofloxacin Hcl] Nausea And Vomiting   Nitrofuran Derivatives Anxiety   Wound Dressing Adhesive Itching and Rash    Review of Systems: Negative except as noted in the HPI.  Objective:  Lori [name] is a pleasant 53 y.o. female in NAD. AAO x 3.  Vascular Examination: Capillary refill time is 3-5 seconds to toes bilateral. Palpable pedal pulses b/l LE. Digital hair present b/l.  Skin temperature gradient WNL b/l.   Dermatological Examination: Pedal skin with normal turgor, texture and tone b/l. No open wounds. No interdigital macerations b/l. Toenails x10 are 3mm thick, discolored, dystrophic with subungual debris. There is pain with compression of the nail plates.  They are elongated x10  Neurological exam: Light touch sensation intact bilateral, protective sensation intact to [facility] monofilament bilateral, temperature sensation intact bilateral.  No loss of sensation to the lateral 3 toes on the right foot.     Latest Ref Rng & Units 04/25/2023    2:30 PM 11/24/2022    6:10 PM 08/27/2022    9:20 AM  Hemoglobin A1C  Hemoglobin-A1c <5.7 % of total Hgb 10.0  8.5  9.8     Assessment/Plan: 1. Pain due to onychomycosis of toenails of both feet  2. 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    Informed the patient that the "numbness" to the lateral 3 toes could be stemming from the sciatic nerve as this would typically involve the lateral 3 toes and often can be positional in nature.  She can discuss this with her PCP or orthopedics/neurologist if it continues.  The feeling of numbness could be exacerbated by the high A1c level at this time as  well.  Stressed the importance of proper blood sugar control  Return in about 3 months (around 08/19/2023) for [facility].   Joe Murders, DPM, FACFAS [facility]     [address] [place], [place] [address]                Office [phone]  Fax [phone]

## 2023-05-20 NOTE — Telephone Encounter (Signed)
 Patient might need to check with the pharmacy since medication list all her insulins are pens: Humalog KwiPen and Glargine Solostar Pen.

## 2023-05-20 NOTE — Telephone Encounter (Signed)
 I called patient to notify her and she yelled at me and started crying. She said WE KEEP SENDING VIALS AND SHE WANTS PENS NOT VIALS!!!!!!!! She stated that insurance wont pay for vials. Message routed back to PCP Ngetich, Elijio Guadeloupe, NP

## 2023-05-21 ENCOUNTER — Emergency Department (HOSPITAL_COMMUNITY)
Admission: EM | Admit: 2023-05-21 | Discharge: 2023-05-21 | Disposition: A | Discharge status: Home / Self Care | Attending: Emergency Medicine | Admitting: Emergency Medicine

## 2023-05-21 ENCOUNTER — Telehealth: Payer: Self-pay

## 2023-05-21 DIAGNOSIS — I10 Essential (primary) hypertension: Secondary | ICD-10-CM | POA: Insufficient documentation

## 2023-05-21 DIAGNOSIS — Z743 Need for continuous supervision: Secondary | ICD-10-CM | POA: Diagnosis not present

## 2023-05-21 DIAGNOSIS — Z7901 Long term (current) use of anticoagulants: Secondary | ICD-10-CM | POA: Insufficient documentation

## 2023-05-21 DIAGNOSIS — I2510 Atherosclerotic heart disease of native coronary artery without angina pectoris: Secondary | ICD-10-CM | POA: Diagnosis not present

## 2023-05-21 DIAGNOSIS — R1084 Generalized abdominal pain: Secondary | ICD-10-CM | POA: Diagnosis not present

## 2023-05-21 DIAGNOSIS — R109 Unspecified abdominal pain: Secondary | ICD-10-CM | POA: Insufficient documentation

## 2023-05-21 DIAGNOSIS — M79602 Pain in left arm: Secondary | ICD-10-CM | POA: Insufficient documentation

## 2023-05-21 DIAGNOSIS — D509 Iron deficiency anemia, unspecified: Secondary | ICD-10-CM

## 2023-05-21 DIAGNOSIS — Z794 Long term (current) use of insulin: Secondary | ICD-10-CM | POA: Insufficient documentation

## 2023-05-21 DIAGNOSIS — E1143 Type 2 diabetes mellitus with diabetic autonomic (poly)neuropathy: Secondary | ICD-10-CM | POA: Diagnosis not present

## 2023-05-21 DIAGNOSIS — Z7401 Bed confinement status: Secondary | ICD-10-CM | POA: Diagnosis not present

## 2023-05-21 DIAGNOSIS — R404 Transient alteration of awareness: Secondary | ICD-10-CM | POA: Diagnosis not present

## 2023-05-21 DIAGNOSIS — K3184 Gastroparesis: Secondary | ICD-10-CM | POA: Diagnosis not present

## 2023-05-21 DIAGNOSIS — R100 Acute abdomen: Secondary | ICD-10-CM | POA: Diagnosis not present

## 2023-05-21 DIAGNOSIS — I959 Hypotension, unspecified: Secondary | ICD-10-CM | POA: Diagnosis not present

## 2023-05-21 DIAGNOSIS — Z79899 Other long term (current) drug therapy: Secondary | ICD-10-CM | POA: Insufficient documentation

## 2023-05-21 MED ORDER — FENTANYL CITRATE PF 50 MCG/ML IJ SOSY
50.0000 ug | PREFILLED_SYRINGE | Freq: Once | INTRAMUSCULAR | Status: AC
  Administered 2023-05-21: 50 ug via INTRAMUSCULAR
  Filled 2023-05-21 (×2): qty 1

## 2023-05-21 NOTE — Telephone Encounter (Signed)
-----   Message from [facility] sent at 04/11/2023  2:19 PM EST ----- Regarding: labs due Patient will be due for CBC and IBC/Ferritin in the next week. Call and remind patient.

## 2023-05-21 NOTE — Discharge Instructions (Signed)
 You have chosen to leave AGAINST MEDICAL ADVICE. Should you change your mind, you are always welcome and encouraged to return to the ED. You are encouraged to follow-up with, at the very least, a primary care provider, or other similar medical professional on this matter.

## 2023-05-21 NOTE — Telephone Encounter (Signed)
 Spoke with patient to let her know that she will have to get in contact with her pharmacy and insurance to found out is going with her medications.  Patient did stated that she was not feeling and has not being eating this was confirmed by patient spouse.  Patient is aware and alert but I told the patient if she not feeling better to please make an appointment.  Please Advise   Message sent to Ngetich, Dinah C, NP

## 2023-05-21 NOTE — ED Notes (Signed)
 Pt started crying out.  Went to pts room to calm her and help get her comfortable.  Pt did calm down.  I advised her that I was working on getting her pain Rx with the PA assigned to her.  Pt understood.

## 2023-05-21 NOTE — Telephone Encounter (Signed)
 noted

## 2023-05-21 NOTE — ED Provider Notes (Signed)
 [place] EMERGENCY DEPARTMENT AT [facility] Provider Note   CSN: [id number] Arrival date & time: 05/21/23  1510     History  No chief complaint on file.   Lori [name] is a 53 y.o. female past medical history significant for obesity, CAD, stroke, pheochromocytoma, hypertension, diabetes, GI bleed, AKI, GERD, diabetic gastroparesis presents today for abdominal pain and left arm pain. HPI limited due to patient refusing to answer questions and only making moaning/screaming noises.  HPI     Home Medications Prior to Admission medications   Medication Sig Start Date End Date Taking? Authorizing Provider  acetaminophen (TYLENOL) 325 MG tablet Take 2 tablets (650 mg total) by mouth every 6 (six) hours as needed for mild pain (pain score 1-3) (or Fever >/= 101). 02/12/23   Armenta Landau, MD  albuterol (PROVENTIL) (2.5 MG/3ML) 0.083% nebulizer solution Take 3 mLs (2.5 mg total) by nebulization every 6 (six) hours as needed for wheezing or shortness of breath. 01/01/23   Ngetich, Dinah C, NP  albuterol (VENTOLIN HFA) 108 (90 Base) MCG/ACT inhaler INHALE 2 PUFFS BY MOUTH EVERY 6 HOURS AS NEEDED FOR WHEEZING AND FOR SHORTNESS OF BREATH Strength: 108 (90 Base) MCG/ACT 01/01/23   Ngetich, Dinah C, NP  atorvastatin (LIPITOR) 80 MG tablet TAKE 1 TABLET (80 MG TOTAL) BY MOUTH DAILY. 03/31/23   Ngetich, Dinah C, NP  baclofen (LIORESAL) 10 MG tablet Take 1 tablet (10 mg total) by mouth 3 (three) times daily. 01/08/23   Ngetich, Dinah C, NP  clopidogrel (PLAVIX) 75 MG tablet TAKE 1 TABLET BY MOUTH DAILY 04/09/23   Ngetich, Dinah C, NP  Continuous Glucose Receiver (FREESTYLE LIBRE 2 READER) DEVI 1 Device by Does not apply route daily. E11.69 11/01/22   Ngetich, Dinah C, NP  Continuous Glucose Sensor (FREESTYLE LIBRE 14 DAY SENSOR) MISC 1 each by Does not apply route as needed. DX: E11.69 12/24/22   Ngetich, Dinah C, NP  cyclobenzaprine (FLEXERIL) 5 MG tablet Take 1  tablet (5 mg total) by mouth 3 (three) times daily as needed for muscle spasms. 05/07/23   Ngetich, Dinah C, NP  diclofenac Sodium (VOLTAREN) 1 % GEL Apply 1 Application topically 4 (four) times daily as needed (pain). 05/15/23   Ngetich, Dinah C, NP  escitalopram (LEXAPRO) 20 MG tablet Take 1 tablet (20 mg total) by mouth every morning. 01/01/23   Ngetich, Dinah C, NP  ezetimibe (ZETIA) 10 MG tablet TAKE 1 TABLET BY MOUTH DAILY 04/09/23   Ngetich, Dinah C, NP  ferrous sulfate 325 (65 FE) MG tablet Take 1 tablet (325 mg total) by mouth daily with breakfast. 05/15/23   Ngetich, Dinah C, NP  gabapentin (NEURONTIN) 100 MG capsule Take 100 mg by mouth 3 (three) times daily. 11/14/22   [provider]  HUMALOG KWIKPEN 100 UNIT/ML KwikPen INJECT 4 UNITS SUBCUTANEOUSLY THREE TIMES A DAY WITH MEALS Patient taking differently: Inject 7 Units into the skin with breakfast, with lunch, and with evening meal. 05/07/23   Ngetich, Dinah C, NP  insulin glargine (LANTUS SOLOSTAR) 100 UNIT/ML Solostar Pen Inject 10 Units into the skin at bedtime. 05/07/23   Ngetich, Dinah C, NP  insulin lispro (HUMALOG) 100 UNIT/ML injection Sliding scale AC, HS CBG 121 - 150: 2 units CBG 151 - 200: 3 units CBG 201 - 250: 5 units CBG 251 - 300: 8 units CBG 301 - 350: 11 units CBG 351 - 400: 15 units CBG > 400: call MD 05/07/23   Ngetich,  Dinah C, NP  methimazole (TAPAZOLE) 5 MG tablet TAKE 1 TABLET BY MOUTH EVERY  MORNING 04/09/23   Ngetich, Dinah C, NP  metoCLOPramide (REGLAN) 10 MG tablet Take 1 tablet (10 mg total) by mouth every 8 (eight) hours as needed for refractory nausea / vomiting. 05/13/23 05/12/24  Adhikari, Amrit, MD  metoprolol tartrate (LOPRESSOR) 50 MG tablet Take 1 tablet (50 mg total) by mouth 2 (two) times daily. 05/13/23 06/12/23  Adhikari, Amrit, MD  olmesartan-hydrochlorothiazide (BENICAR HCT) 40-25 MG tablet Take 1 tablet by mouth daily. 05/14/23   [provider]  pantoprazole (PROTONIX) 40 MG tablet Take 1 tablet (40  mg total) by mouth 2 (two) times daily. 05/15/23   Ngetich, Dinah C, NP  potassium chloride SA (KLOR-CON M) 20 MEQ tablet Take 1 tablet (20 mEq total) by mouth 2 (two) times daily for 7 days. 05/13/23 05/20/23  Adhikari, Amrit, MD  ranolazine (RANEXA) 500 MG 12 hr tablet Take 1 tablet (500 mg total) by mouth 2 (two) times daily. 10/09/22 01/23/24  [place], Rema Care, DO  rivaroxaban (XARELTO) 10 MG TABS tablet TAKE 1 TABLET BY MOUTH DAILY 04/09/23   Ngetich, Dinah C, NP  senna-docusate (SENOKOT-S) 8.6-50 MG tablet Take 1 tablet by mouth 2 (two) times daily. 02/12/23   Armenta Landau, MD  sorbitol 70 % SOLN Take 30 mLs by mouth daily as needed for moderate constipation. Patient not taking: Reported on 05/15/2023 02/12/23   Armenta Landau, MD  sucralfate (CARAFATE) 1 g tablet Take 1 tablet (1 g total) by mouth 4 (four) times daily for 14 days. 05/13/23 05/27/23  Adhikari, Amrit, MD  tamsulosin (FLOMAX) 0.4 MG CAPS capsule Take 1 capsule (0.4 mg total) by mouth at bedtime. 05/07/23   Ngetich, Dinah C, NP  topiramate (TOPAMAX) 25 MG tablet Take 1 tablet (25 mg total) by mouth 2 (two) times daily. 07/10/22   Mason Sole, Pratik D, DO      Allergies    Asa [aspirin], Contrast media [iodinated contrast media], Imitrex [sumatriptan], Ms contin [morphine], Penicillins, Chlorhexidine gluconate [chlorhexidine], Firvanq [vancomycin], Cipro [ciprofloxacin hcl], Nitrofuran derivatives, and Wound dressing adhesive    Review of Systems   Review of Systems  Gastrointestinal:  Positive for abdominal pain.    Physical Exam Updated Vital Signs BP 108/69 (BP Location: Right Arm)   Pulse 74   Temp 97.8 F (36.6 C) (Oral)   Resp 17   SpO2 98%  Physical Exam Vitals and nursing note reviewed.  Constitutional:      General: She is not in acute distress.    Appearance: She is not ill-appearing, toxic-appearing or diaphoretic.  HENT:     Head: Normocephalic and atraumatic.     Nose: Nose normal.  Eyes:     Extraocular Movements:  Extraocular movements intact.  Cardiovascular:     Rate and Rhythm: Normal rate.     Pulses: Normal pulses.  Pulmonary:     Effort: Pulmonary effort is normal.     Breath sounds: Normal breath sounds.  Abdominal:     Palpations: Abdomen is soft.  Skin:    General: Skin is warm and dry.     Capillary Refill: Capillary refill takes less than 2 seconds.  Neurological:     Mental Status: She is alert. Mental status is at baseline.  Psychiatric:        Mood and Affect: Mood normal.     ED Results / Procedures / Treatments   Labs (all labs ordered are listed, but  only abnormal results are displayed) Labs Reviewed  COMPREHENSIVE METABOLIC PANEL WITH GFR  LIPASE, BLOOD  CBC WITH DIFFERENTIAL/PLATELET  URINALYSIS, ROUTINE W REFLEX MICROSCOPIC  CBG MONITORING, ED    EKG EKG Interpretation Date/Time:  Wednesday May 21 2023 15:22:04 EDT Ventricular Rate:  83 PR Interval:  171 QRS Duration:  87 QT Interval:  393 QTC Calculation: 462 R Axis:   58  Text Interpretation: Sinus rhythm Confirmed by Zackowski, Scott [phone]) on 05/21/2023 4:11:30 PM  Radiology No results found.  Procedures Procedures    Medications Ordered in ED Medications  fentaNYL (SUBLIMAZE) injection 50 mcg (has no administration in time range)    ED Course/ Medical Decision Making/ A&P                                 Medical Decision Making  This patient presents to the ED for concern of abdominal pain, this involves an extensive number of treatment options, and is a complaint that carries with it a high risk of complications and morbidity.  The differential diagnosis includes gastroparesis, pancreatitis, acute cholecystitis, choledocholithiasis, appendicitis, constipation, viral GI illness, intra-abdominal abscess   Co morbidities that complicate the patient evaluation  Gastroparesis, diabetes  Cardiac Monitoring: / EKG:  The patient was maintained on a cardiac monitor.  I personally viewed and  interpreted the cardiac monitored which showed an underlying rhythm of: Sinus rhythm   Patient wishes to leave AGAINST MEDICAL ADVICE. I personally explained need for further testing and my concerns for adverse outcomes if workup is incomplete. Specific concerns explained to patient include worsening symptoms, functional loss, long term sickness and death. Patient states understanding of risks and states they will return if they feel the need to at a later date to receive the recommended care or any other care at any time, regardless of their ability to pay for such care. Patient understands they are able to return at any time. Patient is able to explain back the risks of leaving AMA and still wishes to leave.   Specifically I recommend CBC, CMP, lipase, UA, and CT abdomen to evaluate and exclude pancreatitis, acute cholecystitis, choledocholithiasis, appendicitis, constipation, viral GI illness, intra-abdominal abscess, etc.  The patient is oriented to person, place, and time, has the capacity to make decisions regarding the medical care offered. The patient speaks coherently and exhibits no evidence of having an altered level of consciousness or alcohol or drug intoxication to a point that would impair judgment. They respond knowingly to questions about recommended treatment and alternate treatments including no further testing or treatment; participate in diagnostic and treatment decisions by means of rational thought processes; and understand the items of minimum basic medical treatment information with respect to that treatment (the nature and seriousness of the illness, the nature of the treatment, the probable degree and duration of any benefits and risks of any medical intervention that is being recommended, and the consequences of lack of treatment, and the nature, risks, and benefits of any reasonable alternatives).  The patient understands the relevant information of the nature of their medical  condition, as well as the risks, benefits, and treatment alternatives (including non-treatment), consequences of refusing care, and can competently communicate a rational explanation about their choice of care options.    Included in AVS was the following message:  You have chosen to leave AGAINST MEDICAL ADVICE. Should you change your mind, you are always welcome and encouraged to return  to the ED. You are encouraged to follow-up with, at the very least, a primary care provider, or other similar medical professional on this matter.         Final Clinical Impression(s) / ED Diagnoses Final diagnoses:  Abdominal pain, unspecified abdominal location    Rx / DC Orders ED Discharge Orders     None         Carie Charity, PA-C 05/21/23 1706    Cheyenne Cotta, MD 05/22/23 1821

## 2023-05-21 NOTE — Telephone Encounter (Signed)
 Labs entered and MyChart message to patient to go to the lab this week

## 2023-05-21 NOTE — Progress Notes (Signed)
 A STAT consult was placed to the IV Nurse for labs and an IV start; pt refusing IV, saying "no! No!"   PA at bedside;  will have the lab draw the patient's blood.  No IV meds or procedures ordered as of 1630

## 2023-05-21 NOTE — ED Triage Notes (Addendum)
 Per EMS from home. Reports of abdominal pain off and on for past two months. Pt reports problems not having bowel movements. Lft arm affected from previous stroke, now pain and swelling.

## 2023-05-23 ENCOUNTER — Other Ambulatory Visit: Payer: Self-pay | Admitting: Family

## 2023-05-23 DIAGNOSIS — F419 Anxiety disorder, unspecified: Secondary | ICD-10-CM

## 2023-05-26 NOTE — Telephone Encounter (Signed)
 Called patient but unable to leave a message as phone rings and then goes busy.  Will mail a letter to patient.

## 2023-05-28 ENCOUNTER — Emergency Department (HOSPITAL_COMMUNITY)

## 2023-05-28 ENCOUNTER — Ambulatory Visit: Payer: Self-pay

## 2023-05-28 ENCOUNTER — Other Ambulatory Visit: Payer: Self-pay

## 2023-05-28 ENCOUNTER — Emergency Department (HOSPITAL_COMMUNITY)
Admission: EM | Admit: 2023-05-28 | Discharge: 2023-05-28 | Disposition: A | Discharge status: Home / Self Care | Source: Home / Self Care | Attending: Emergency Medicine | Admitting: Emergency Medicine

## 2023-05-28 DIAGNOSIS — R1111 Vomiting without nausea: Secondary | ICD-10-CM | POA: Diagnosis not present

## 2023-05-28 DIAGNOSIS — Z951 Presence of aortocoronary bypass graft: Secondary | ICD-10-CM | POA: Diagnosis not present

## 2023-05-28 DIAGNOSIS — I110 Hypertensive heart disease with heart failure: Secondary | ICD-10-CM | POA: Diagnosis not present

## 2023-05-28 DIAGNOSIS — N179 Acute kidney failure, unspecified: Secondary | ICD-10-CM | POA: Diagnosis not present

## 2023-05-28 DIAGNOSIS — I2510 Atherosclerotic heart disease of native coronary artery without angina pectoris: Secondary | ICD-10-CM | POA: Diagnosis not present

## 2023-05-28 DIAGNOSIS — R1084 Generalized abdominal pain: Secondary | ICD-10-CM | POA: Diagnosis not present

## 2023-05-28 DIAGNOSIS — K5750 Diverticulosis of both small and large intestine without perforation or abscess without bleeding: Secondary | ICD-10-CM | POA: Diagnosis not present

## 2023-05-28 DIAGNOSIS — Z79899 Other long term (current) drug therapy: Secondary | ICD-10-CM | POA: Insufficient documentation

## 2023-05-28 DIAGNOSIS — E1165 Type 2 diabetes mellitus with hyperglycemia: Secondary | ICD-10-CM | POA: Insufficient documentation

## 2023-05-28 DIAGNOSIS — Z7902 Long term (current) use of antithrombotics/antiplatelets: Secondary | ICD-10-CM | POA: Insufficient documentation

## 2023-05-28 DIAGNOSIS — R109 Unspecified abdominal pain: Secondary | ICD-10-CM | POA: Diagnosis not present

## 2023-05-28 DIAGNOSIS — I69391 Dysphagia following cerebral infarction: Secondary | ICD-10-CM | POA: Diagnosis not present

## 2023-05-28 DIAGNOSIS — I714 Abdominal aortic aneurysm, without rupture, unspecified: Secondary | ICD-10-CM | POA: Diagnosis not present

## 2023-05-28 DIAGNOSIS — K449 Diaphragmatic hernia without obstruction or gangrene: Secondary | ICD-10-CM | POA: Diagnosis not present

## 2023-05-28 DIAGNOSIS — I6930 Unspecified sequelae of cerebral infarction: Secondary | ICD-10-CM | POA: Diagnosis not present

## 2023-05-28 DIAGNOSIS — K219 Gastro-esophageal reflux disease without esophagitis: Secondary | ICD-10-CM | POA: Diagnosis not present

## 2023-05-28 DIAGNOSIS — K922 Gastrointestinal hemorrhage, unspecified: Secondary | ICD-10-CM | POA: Diagnosis not present

## 2023-05-28 DIAGNOSIS — Z8616 Personal history of COVID-19: Secondary | ICD-10-CM | POA: Diagnosis not present

## 2023-05-28 DIAGNOSIS — I69354 Hemiplegia and hemiparesis following cerebral infarction affecting left non-dominant side: Secondary | ICD-10-CM | POA: Diagnosis not present

## 2023-05-28 DIAGNOSIS — I509 Heart failure, unspecified: Secondary | ICD-10-CM | POA: Diagnosis not present

## 2023-05-28 DIAGNOSIS — I5032 Chronic diastolic (congestive) heart failure: Secondary | ICD-10-CM | POA: Diagnosis not present

## 2023-05-28 DIAGNOSIS — E119 Type 2 diabetes mellitus without complications: Secondary | ICD-10-CM | POA: Diagnosis not present

## 2023-05-28 DIAGNOSIS — E1169 Type 2 diabetes mellitus with other specified complication: Secondary | ICD-10-CM | POA: Diagnosis not present

## 2023-05-28 DIAGNOSIS — Z86718 Personal history of other venous thrombosis and embolism: Secondary | ICD-10-CM | POA: Diagnosis not present

## 2023-05-28 DIAGNOSIS — Z993 Dependence on wheelchair: Secondary | ICD-10-CM | POA: Diagnosis not present

## 2023-05-28 DIAGNOSIS — R1012 Left upper quadrant pain: Secondary | ICD-10-CM | POA: Insufficient documentation

## 2023-05-28 DIAGNOSIS — K3184 Gastroparesis: Secondary | ICD-10-CM | POA: Diagnosis not present

## 2023-05-28 DIAGNOSIS — Z7901 Long term (current) use of anticoagulants: Secondary | ICD-10-CM | POA: Diagnosis not present

## 2023-05-28 DIAGNOSIS — E785 Hyperlipidemia, unspecified: Secondary | ICD-10-CM | POA: Diagnosis not present

## 2023-05-28 DIAGNOSIS — D649 Anemia, unspecified: Secondary | ICD-10-CM | POA: Diagnosis not present

## 2023-05-28 DIAGNOSIS — E0590 Thyrotoxicosis, unspecified without thyrotoxic crisis or storm: Secondary | ICD-10-CM | POA: Diagnosis not present

## 2023-05-28 DIAGNOSIS — E039 Hypothyroidism, unspecified: Secondary | ICD-10-CM | POA: Insufficient documentation

## 2023-05-28 DIAGNOSIS — R944 Abnormal results of kidney function studies: Secondary | ICD-10-CM | POA: Insufficient documentation

## 2023-05-28 DIAGNOSIS — Z794 Long term (current) use of insulin: Secondary | ICD-10-CM | POA: Diagnosis not present

## 2023-05-28 DIAGNOSIS — R509 Fever, unspecified: Secondary | ICD-10-CM | POA: Diagnosis not present

## 2023-05-28 DIAGNOSIS — Z556 Problems related to health literacy: Secondary | ICD-10-CM | POA: Diagnosis not present

## 2023-05-28 DIAGNOSIS — R112 Nausea with vomiting, unspecified: Secondary | ICD-10-CM | POA: Diagnosis not present

## 2023-05-28 DIAGNOSIS — G4733 Obstructive sleep apnea (adult) (pediatric): Secondary | ICD-10-CM | POA: Diagnosis not present

## 2023-05-28 DIAGNOSIS — J4489 Other specified chronic obstructive pulmonary disease: Secondary | ICD-10-CM | POA: Diagnosis not present

## 2023-05-28 DIAGNOSIS — R040 Epistaxis: Secondary | ICD-10-CM | POA: Diagnosis not present

## 2023-05-28 DIAGNOSIS — I10 Essential (primary) hypertension: Secondary | ICD-10-CM | POA: Insufficient documentation

## 2023-05-28 DIAGNOSIS — K5901 Slow transit constipation: Secondary | ICD-10-CM | POA: Diagnosis not present

## 2023-05-28 DIAGNOSIS — R739 Hyperglycemia, unspecified: Secondary | ICD-10-CM | POA: Diagnosis not present

## 2023-05-28 DIAGNOSIS — K5900 Constipation, unspecified: Secondary | ICD-10-CM | POA: Diagnosis not present

## 2023-05-28 DIAGNOSIS — E7849 Other hyperlipidemia: Secondary | ICD-10-CM | POA: Diagnosis not present

## 2023-05-28 DIAGNOSIS — I69320 Aphasia following cerebral infarction: Secondary | ICD-10-CM | POA: Diagnosis not present

## 2023-05-28 DIAGNOSIS — E1143 Type 2 diabetes mellitus with diabetic autonomic (poly)neuropathy: Secondary | ICD-10-CM | POA: Diagnosis not present

## 2023-05-28 DIAGNOSIS — Z7401 Bed confinement status: Secondary | ICD-10-CM | POA: Diagnosis not present

## 2023-05-28 DIAGNOSIS — R000 Tachycardia, unspecified: Secondary | ICD-10-CM | POA: Diagnosis not present

## 2023-05-28 DIAGNOSIS — J4520 Mild intermittent asthma, uncomplicated: Secondary | ICD-10-CM | POA: Diagnosis not present

## 2023-05-28 LAB — CBC WITH DIFFERENTIAL/PLATELET
Abs Immature Granulocytes: 0.02 10*3/uL (ref 0.00–0.07)
Basophils Absolute: 0.1 10*3/uL (ref 0.0–0.1)
Basophils Relative: 1 %
Eosinophils Absolute: 0.2 10*3/uL (ref 0.0–0.5)
Eosinophils Relative: 3 %
HCT: 37.4 % (ref 36.0–46.0)
Hemoglobin: 12.4 g/dL (ref 12.0–15.0)
Immature Granulocytes: 0 %
Lymphocytes Relative: 35 %
Lymphs Abs: 2.7 10*3/uL (ref 0.7–4.0)
MCH: 26.7 pg (ref 26.0–34.0)
MCHC: 33.2 g/dL (ref 30.0–36.0)
MCV: 80.6 fL (ref 80.0–100.0)
Monocytes Absolute: 0.6 10*3/uL (ref 0.1–1.0)
Monocytes Relative: 8 %
Neutro Abs: 4.1 10*3/uL (ref 1.7–7.7)
Neutrophils Relative %: 53 %
Platelets: 433 10*3/uL — ABNORMAL HIGH (ref 150–400)
RBC: 4.64 MIL/uL (ref 3.87–5.11)
RDW: 15.9 % — ABNORMAL HIGH (ref 11.5–15.5)
WBC: 7.7 10*3/uL (ref 4.0–10.5)
nRBC: 0 % (ref 0.0–0.2)

## 2023-05-28 LAB — COMPREHENSIVE METABOLIC PANEL WITH GFR
ALT: 18 U/L (ref 0–44)
AST: 25 U/L (ref 15–41)
Albumin: 3.5 g/dL (ref 3.5–5.0)
Alkaline Phosphatase: 70 U/L (ref 38–126)
Anion gap: 10 (ref 5–15)
BUN: 27 mg/dL — ABNORMAL HIGH (ref 6–20)
CO2: 26 mmol/L (ref 22–32)
Calcium: 9.4 mg/dL (ref 8.9–10.3)
Chloride: 98 mmol/L (ref 98–111)
Creatinine, Ser: 1.02 mg/dL — ABNORMAL HIGH (ref 0.44–1.00)
GFR, Estimated: >60 mL/min (ref >60)
Glucose, Bld: 248 mg/dL — ABNORMAL HIGH (ref 70–99)
Potassium: 4.3 mmol/L (ref 3.5–5.1)
Sodium: 134 mmol/L — ABNORMAL LOW (ref 135–145)
Total Bilirubin: 0.7 mg/dL (ref 0.0–1.2)
Total Protein: 7.7 g/dL (ref 6.5–8.1)

## 2023-05-28 LAB — URINALYSIS, ROUTINE W REFLEX MICROSCOPIC
Bilirubin Urine: NEGATIVE
Glucose, UA: NEGATIVE mg/dL
Hgb urine dipstick: NEGATIVE
Ketones, ur: NEGATIVE mg/dL
Leukocytes,Ua: NEGATIVE
Nitrite: NEGATIVE
Protein, ur: NEGATIVE mg/dL
Specific Gravity, Urine: 1.011 (ref 1.005–1.030)
pH: 5 (ref 5.0–8.0)

## 2023-05-28 LAB — CBG MONITORING, ED: Glucose-Capillary: 209 mg/dL — ABNORMAL HIGH (ref 70–99)

## 2023-05-28 LAB — LIPASE, BLOOD: Lipase: 23 U/L (ref 11–51)

## 2023-05-28 MED ORDER — ONDANSETRON HCL 4 MG/2ML IJ SOLN
4.0000 mg | Freq: Once | INTRAMUSCULAR | Status: AC
  Administered 2023-05-28: 4 mg via INTRAVENOUS
  Filled 2023-05-28: qty 2

## 2023-05-28 MED ORDER — SODIUM CHLORIDE 0.9 % IV BOLUS
1000.0000 mL | Freq: Once | INTRAVENOUS | Status: AC
  Administered 2023-05-28: 1000 mL via INTRAVENOUS

## 2023-05-28 MED ORDER — LORAZEPAM 2 MG/ML IJ SOLN
1.0000 mg | Freq: Once | INTRAMUSCULAR | Status: AC
  Administered 2023-05-28: 1 mg via INTRAVENOUS
  Filled 2023-05-28: qty 1

## 2023-05-28 MED ORDER — DIPHENHYDRAMINE HCL 25 MG PO CAPS
25.0000 mg | ORAL_CAPSULE | Freq: Four times a day (QID) | ORAL | Status: DC | PRN
  Administered 2023-05-28: 25 mg via ORAL
  Filled 2023-05-28: qty 1

## 2023-05-28 MED ORDER — ONDANSETRON 4 MG PO TBDP: 4.0000 mg | ORAL_TABLET | Freq: Three times a day (TID) | ORAL | 0 refills | Status: DC | PRN

## 2023-05-28 MED ORDER — HYDROMORPHONE HCL 1 MG/ML IJ SOLN
1.0000 mg | Freq: Once | INTRAMUSCULAR | Status: AC
  Administered 2023-05-28: 1 mg via INTRAVENOUS
  Filled 2023-05-28: qty 1

## 2023-05-28 NOTE — Telephone Encounter (Signed)
 Noted, will send to PCP a a FYI

## 2023-05-28 NOTE — ED Notes (Signed)
 This RN

## 2023-05-28 NOTE — ED Notes (Signed)
 Pt's spouse called to notify of patient coming home

## 2023-05-28 NOTE — ED Triage Notes (Signed)
 Pt arrived with EMS reporting constipation x4days and vomiting that started yesterday. Patient also has LUQ tenderness. Patient has known deficit from previous strokes. Also has slurred speech. No other symptoms reported at this time

## 2023-05-28 NOTE — ED Provider Notes (Signed)
 [place] EMERGENCY DEPARTMENT AT [facility] Provider Note   CSN: [id number] Arrival date & time: 05/28/23  1128     History  Chief Complaint  Patient presents with   Abdominal Pain   Emesis   HPI Lori [name] is a 53 y.o. female with history of CVA and left-sided deficits, aphasia, hypertension, hypothyroidism, type 2 diabetes, CAD s/p CABG presenting for abdominal pain.  Started 4 days ago.  Primarily in the left upper quadrant but does radiate all about the abdomen.  Endorses associated nausea and vomiting.  Also states she may be constipated.  States she has not had an bowel movement in 4 days but her appetite is also been poor as well.  Denies sick contacts.  Denies chest pain and shortness of breath.   Abdominal Pain Associated symptoms: vomiting   Emesis Associated symptoms: abdominal pain        Home Medications Prior to Admission medications   Medication Sig Start Date End Date Taking? Authorizing Provider  acetaminophen  (TYLENOL ) 325 MG tablet Take 2 tablets (650 mg total) by mouth every 6 (six) hours as needed for mild pain (pain score 1-3) (or Fever >/= 101). 02/12/23   Armenta Landau, MD  albuterol  (PROVENTIL ) (2.5 MG/3ML) 0.083% nebulizer solution Take 3 mLs (2.5 mg total) by nebulization every 6 (six) hours as needed for wheezing or shortness of breath. 01/01/23   Ngetich, Dinah C, NP  albuterol  (VENTOLIN  HFA) 108 (90 Base) MCG/ACT inhaler INHALE 2 PUFFS BY MOUTH EVERY 6 HOURS AS NEEDED FOR WHEEZING AND FOR SHORTNESS OF BREATH Strength: 108 (90 Base) MCG/ACT 01/01/23   Ngetich, Dinah C, NP  atorvastatin  (LIPITOR ) 80 MG tablet TAKE 1 TABLET (80 MG TOTAL) BY MOUTH DAILY. 03/31/23   Ngetich, Dinah C, NP  baclofen  (LIORESAL ) 10 MG tablet Take 1 tablet (10 mg total) by mouth 3 (three) times daily. 01/08/23   Ngetich, Dinah C, NP  clopidogrel  (PLAVIX ) 75 MG tablet TAKE 1 TABLET BY MOUTH DAILY 04/09/23   Ngetich, Dinah C, NP  Continuous  Glucose Receiver (FREESTYLE LIBRE 2 READER) DEVI 1 Device by Does not apply route daily. E11.69 11/01/22   Ngetich, Dinah C, NP  Continuous Glucose Sensor (FREESTYLE LIBRE 14 DAY SENSOR) MISC 1 each by Does not apply route as needed. DX: E11.69 12/24/22   Ngetich, Dinah C, NP  cyclobenzaprine  (FLEXERIL ) 5 MG tablet Take 1 tablet (5 mg total) by mouth 3 (three) times daily as needed for muscle spasms. 05/07/23   Ngetich, Dinah C, NP  diclofenac  Sodium (VOLTAREN ) 1 % GEL Apply 1 Application topically 4 (four) times daily as needed (pain). 05/15/23   Ngetich, Dinah C, NP  escitalopram  (LEXAPRO ) 20 MG tablet Take 1 tablet (20 mg total) by mouth every morning. 01/01/23   Ngetich, Dinah C, NP  ezetimibe  (ZETIA ) 10 MG tablet TAKE 1 TABLET BY MOUTH DAILY 04/09/23   Ngetich, Dinah C, NP  ferrous sulfate  325 (65 FE) MG tablet Take 1 tablet (325 mg total) by mouth daily with breakfast. 05/15/23   Ngetich, Dinah C, NP  gabapentin  (NEURONTIN ) 100 MG capsule Take 100 mg by mouth 3 (three) times daily. 11/14/22   [provider]  HUMALOG  KWIKPEN 100 UNIT/ML KwikPen INJECT 4 UNITS SUBCUTANEOUSLY THREE TIMES A DAY WITH MEALS Patient taking differently: Inject 7 Units into the skin with breakfast, with lunch, and with evening meal. 05/07/23   Ngetich, Dinah C, NP  insulin  glargine (LANTUS  SOLOSTAR) 100 UNIT/ML Solostar Pen Inject 10  Units into the skin at bedtime. 05/07/23   Ngetich, Dinah C, NP  insulin  lispro (HUMALOG ) 100 UNIT/ML injection Sliding scale AC, HS CBG 121 - 150: 2 units CBG 151 - 200: 3 units CBG 201 - 250: 5 units CBG 251 - 300: 8 units CBG 301 - 350: 11 units CBG 351 - 400: 15 units CBG > 400: call MD 05/07/23   Ngetich, Dinah C, NP  methimazole  (TAPAZOLE ) 5 MG tablet TAKE 1 TABLET BY MOUTH EVERY  MORNING 04/09/23   Ngetich, Dinah C, NP  metoCLOPramide  (REGLAN ) 10 MG tablet Take 1 tablet (10 mg total) by mouth every 8 (eight) hours as needed for refractory nausea / vomiting. 05/13/23 05/12/24  Adhikari, Amrit, MD   metoprolol  tartrate (LOPRESSOR ) 50 MG tablet Take 1 tablet (50 mg total) by mouth 2 (two) times daily. 05/13/23 06/12/23  Leona Rake, MD  olmesartan -hydrochlorothiazide  (BENICAR  HCT) 40-25 MG tablet Take 1 tablet by mouth daily. 05/14/23   [provider]  pantoprazole  (PROTONIX ) 40 MG tablet Take 1 tablet (40 mg total) by mouth 2 (two) times daily. 05/15/23   Ngetich, Dinah C, NP  potassium chloride  SA (KLOR-CON  M) 20 MEQ tablet Take 1 tablet (20 mEq total) by mouth 2 (two) times daily for 7 days. 05/13/23 05/20/23  Leona Rake, MD  ranolazine  (RANEXA ) 500 MG 12 hr tablet Take 1 tablet (500 mg total) by mouth 2 (two) times daily. 10/09/22 01/23/24  [place], Rema Care, DO  rivaroxaban  (XARELTO ) 10 MG TABS tablet TAKE 1 TABLET BY MOUTH DAILY 04/09/23   Ngetich, Dinah C, NP  senna-docusate (SENOKOT-S) 8.6-50 MG tablet Take 1 tablet by mouth 2 (two) times daily. 02/12/23   Armenta Landau, MD  sorbitol  70 % SOLN Take 30 mLs by mouth daily as needed for moderate constipation. Patient not taking: Reported on 05/15/2023 02/12/23   Armenta Landau, MD  sucralfate  (CARAFATE ) 1 g tablet Take 1 tablet (1 g total) by mouth 4 (four) times daily for 14 days. 05/13/23 05/27/23  Leona Rake, MD  tamsulosin  (FLOMAX ) 0.4 MG CAPS capsule Take 1 capsule (0.4 mg total) by mouth at bedtime. 05/07/23   Ngetich, Dinah C, NP  topiramate  (TOPAMAX ) 25 MG tablet Take 1 tablet (25 mg total) by mouth 2 (two) times daily. 07/10/22   Doreene Gammon D, DO      Allergies    Milagros Alf ], Contrast media [iodinated contrast media], Imitrex [sumatriptan], Ms contin  [morphine ], Penicillins, Chlorhexidine  gluconate [chlorhexidine ], Firvanq  [vancomycin ], Cipro  [ciprofloxacin  hcl], Nitrofuran derivatives, and Wound dressing adhesive    Review of Systems   Review of Systems  Gastrointestinal:  Positive for abdominal pain and vomiting.    Physical Exam   Vitals:   05/28/23 1200 05/28/23 1504  BP: (!) 125/92 (!) 116/91  Pulse: (!)  117 98  Resp: (!) 23 18  Temp:    SpO2: 96% 98%    CONSTITUTIONAL:  well-appearing, NAD NEURO:  Alert and oriented x 3, CN 3-12 grossly intact, aphasic EYES:  eyes equal and reactive ENT/NECK:  Supple, no stridor  CARDIO:  regular rate and rhythm, appears well-perfused  PULM:  No respiratory distress, CTAB GI/GU:  non-distended, soft, upper abdominal tenderness but no guarding, no CVA tenderness MSK/SPINE:  No gross deformities, no edema, moves all extremities, left hand contracted SKIN:  no rash, atraumatic  *Additional and/or pertinent findings included in MDM below  ED Results / Procedures / Treatments   Labs (all labs ordered are listed, but only abnormal results are displayed)  Labs Reviewed  CBC WITH DIFFERENTIAL/PLATELET - Abnormal; Notable for the following components:      Result Value   RDW 15.9 (*)    Platelets 433 (*)    All other components within normal limits  COMPREHENSIVE METABOLIC PANEL WITH GFR - Abnormal; Notable for the following components:   Sodium 134 (*)    Glucose, Bld 248 (*)    BUN 27 (*)    Creatinine, Ser 1.02 (*)    All other components within normal limits  LIPASE, BLOOD  URINALYSIS, ROUTINE W REFLEX MICROSCOPIC    EKG None  Radiology CT ABDOMEN PELVIS WO CONTRAST Result Date: 05/28/2023 CLINICAL DATA:  Abdominal pain, acute, nonlocalized EXAM: CT ABDOMEN AND PELVIS WITHOUT CONTRAST TECHNIQUE: Multidetector CT imaging of the abdomen and pelvis was performed following the standard protocol without IV contrast. RADIATION DOSE REDUCTION: This exam was performed according to the departmental dose-optimization program which includes automated exposure control, adjustment of the mA and/or kV according to patient size and/or use of iterative reconstruction technique. COMPARISON:  04/27/2023 FINDINGS: Lower chest: No pleural or pericardial effusion. Coronary and aortic calcifications, post CABG. Linear scarring or atelectasis in the right lower lobe.  Hepatobiliary: No focal liver abnormality is seen. No gallstones, gallbladder wall thickening, or biliary dilatation. Stable punctate metallic density posterior to the intrahepatic IVC. Pancreas: Scattered coarse pancreatic parenchymal calcifications predominantly in the head. No discrete mass or regional inflammatory changes. Spleen: Normal in size without focal abnormality. Adrenals/Urinary Tract: .left adrenal gland unremarkable. Metallic clips in the region of the right adrenal, which is not visualized. Symmetric renal contours without urolithiasis or hydronephrosis. Urinary bladder partially distended. Stomach/Bowel: Small hiatal hernia. Stomach incompletely distended. duodenal diverticulum. Small bowel decompressed. Normal appendix. The colon is partially distended, with a few scattered descending and sigmoid diverticula; no adjacent inflammatory changes. Vascular/Lymphatic: Moderate calcified aortoiliac plaque without AAA. No abdominal or pelvic adenopathy. Reproductive: Status post hysterectomy. No adnexal masses. Other: Stable 4 cm soft tissue density deep subcutaneous midline lesion in the anterior abdominal wall just above the symphysis pubis. No ascites. No free air. Musculoskeletal: Sternotomy wires. IMPRESSION: 1. No acute findings. 2. Colonic diverticulosis. 3.  Aortic Atherosclerosis (ICD10-I70.0). Electronically Signed   By: Nicoletta Barrier M.D.   On: 05/28/2023 15:23    Procedures Procedures    Medications Ordered in ED Medications  sodium chloride  0.9 % bolus 1,000 mL (1,000 mLs Intravenous New Bag/Given 05/28/23 1407)  ondansetron  (ZOFRAN ) injection 4 mg (4 mg Intravenous Given 05/28/23 1343)  HYDROmorphone  (DILAUDID ) injection 1 mg (1 mg Intravenous Given 05/28/23 1345)    ED Course/ Medical Decision Making/ A&P                                 Medical Decision Making Amount and/or Complexity of Data Reviewed Labs: ordered. Radiology: ordered.  Risk Prescription drug  management.   Initial Impression and Ddx 53 year old well-appearing female presenting for abdominal pain.  Exam notable for upper abdominal tenderness.  DDx includes diverticulitis, kidney stone, bowel obstruction, pyelonephritis, appendicitis, ACS, rib fracture, electrolyte derangement, viral gastroenteritis, acute pancreatitis, other. Patient PMH that increases complexity of ED encounter:  history of CVA and left-sided deficits, aphasia, hypertension, hypothyroidism, type 2 diabetes, CAD s/p CABG   Interpretation of Diagnostics - I independent reviewed and interpreted the labs as followed: Hyperglycemia, elevated BUN and creatinine  - CT abdomen pelvis is pending  -I personally reviewed and interpreted EKG which revealed  sinus tachycardia  Patient Reassessment and Ultimate Disposition/Management On reassess, heart rate did improve with volume resuscitation.  This could likely be a viral process.  CT is pending.  If remaining workup is reassuring, patient given fluid challenge without complication and she is feeling better, likely discharge with PCP follow-up.  Signed out to [facility].  Patient management required discussion with the following services or consulting groups:  None  Complexity of Problems Addressed Acute complicated illness or Injury  Additional Data Reviewed and Analyzed Further history obtained from: Past medical history and medications listed in the EMR and Prior ED visit notes  Patient Encounter Risk Assessment Consideration of hospitalization         Final Clinical Impression(s) / ED Diagnoses Final diagnoses:  Abdominal pain, unspecified abdominal location    Rx / DC Orders ED Discharge Orders     None         Janalee Mcmurray, PA-C 05/28/23 1530    Trish Furl, MD 05/29/23 [phone]

## 2023-05-28 NOTE — Telephone Encounter (Signed)
 Patient called to get lab results. Read provider note verbatim. Patient verbalized understanding and has no additional questions or concerns at this time.

## 2023-05-28 NOTE — Telephone Encounter (Signed)
 Copied from CRM [id number]. Topic: Clinical - Red Word Triage >> May 28, 2023 10:17 AM Jayson Michael wrote: [facility] that prompted transfer to Nurse Triage:  No bowel movement in 4 days, pain, nausea, history of gastroparesis.  Patient's physical therapy assistant, Caitlyn with [facility], called on behalf of the patient. Patient has not had a bowel movement since Saturday (4 days). She does not have any medications at home to assist and is seeking recommendations. Patient has a history of gastroparesis and is currently experiencing pain and nausea. Sending to triage for clinical review and guidance.   Chief Complaint: Constipation  Symptoms: Constipation, nausea, vomiting, abdominal pain  Frequency: Constant  Pertinent Negatives: Patient denies rectal pain Disposition: [x] ED /[] Urgent Care (no appt availability in office) / [] Appointment(In office/virtual)/ []  [facility]/ [] Home Care/ [] Refused Recommended Disposition /[] [place] [facility]/ []  Follow-up with PCP Additional Notes: Caitlyn with [facility] called stating that the patient informed her that she had not had a bowel movement since Saturday. She states that the patient has also had nausea, vomiting, abdominal pain, and has not been passing gas. I advised that these symptoms are concerning for an intestinal blockage and that the patient should go to the ED for evaluation. Patient is with Caitlyn and is agreeable with this plan.     Reason for Disposition  [1] Vomiting AND [2] contains bile (green color)    Unsure if contains bile  Answer Assessment - Initial Assessment Questions 1. STOOL PATTERN OR FREQUENCY: "How often do you have a bowel movement (BM)?"  (Normal range: 3 times a day to every 3 days)  "When was your last BM?"       Last BM was 4 days ago  2. STRAINING: "Do you have to strain to have a BM?"      Yes 3. RECTAL PAIN: "Does your rectum hurt when the stool comes out?" If Yes, ask: "Do you  have hemorrhoids? How bad is the pain?"  (Scale 1-10; or mild, moderate, severe)     No 4. STOOL COMPOSITION: "Are the stools hard?"      N/A 5. BLOOD ON STOOLS: "Has there been any blood on the toilet tissue or on the surface of the BM?" If Yes, ask: "When was the last time?"     N/A 6. CHRONIC CONSTIPATION: "Is this a new problem for you?"  If No, ask: "How long have you had this problem?" (days, weeks, months)      History of gastroparesis  7. CHANGES IN DIET OR HYDRATION: "Have there been any recent changes in your diet?" "How much fluids are you drinking on a daily basis?"  "How much have you had to drink today?"     N/A 8. MEDICINES: "Have you been taking any new medicines?" "Are you taking any narcotic pain medicines?" (e.g., Dilaudid , morphine , Percocet, Vicodin)     No 9. LAXATIVES: "Have you been using any stool softeners, laxatives, or enemas?"  If Yes, ask "What, how often, and when was the last time?"     No 10. ACTIVITY:  "How much walking do you do every day?"  "Has your activity level decreased in the past week?"        Not much activity  11. CAUSE: "What do you think is causing the constipation?"        Unsure  12. OTHER SYMPTOMS: "Do you have any other symptoms?" (e.g., abdomen pain, bloating, fever, vomiting)  Abdominal pain, nausea  13. MEDICAL HISTORY: "Do you have a history of hemorrhoids, rectal fissures, or rectal surgery or rectal abscess?"         No 14. PREGNANCY: "Is there any chance you are pregnant?" "When was your last menstrual period?"       No  Protocols used: Constipation-A-AH

## 2023-05-28 NOTE — ED Provider Notes (Signed)
 Accepted handoff at shift change from John Robinson PA-C. Please see prior provider note for more detail.   Briefly: Patient is 53 y.o. "presenting for abdominal pain.  Started 4 days ago.  Primarily in the left upper quadrant but does radiate all about the abdomen.  Endorses associated nausea and vomiting.  Also states she may be constipated.  States she has not had an bowel movement in 4 days but her appetite is also been poor as well.  Denies sick contacts.  Denies chest pain and shortness of breath."  DDX: concern for diverticulitis, kidney stone, bowel obstruction, pyelonephritis, appendicitis, ACS, rib fracture, electrolyte derangement, viral gastroenteritis, acute pancreatitis, other.   Plan:  - dispo pending CT and urine - patient telling me that the pain is more associated with lower abdominal quadrants. Patient also stating that she needs sleep and is requesting ativan  which I have provided. - Lipase within normal limits.  CBC without leukocytosis or anemia.  UA not concerning for infection.  CMP with very mild elevation in BUN/Cr at 27/1.02.  CBG 209. Patient given IV fluids for her very mild AKI. - CT without acute abnormality. Shared all results with patient. Answered all questions. Patient tolerating apple juice. No vomiting in ED. Will send patient home with Zofran . Recommended following up with PCP. Patient verbalized understanding of plan. - Patient afebrile with stable vitals.  Provided with return precautions.  Discharged in good condition.    Don Fritter 05/28/23 2112    Trish Furl, MD 05/29/23 [phone]

## 2023-05-28 NOTE — Telephone Encounter (Signed)
 noted

## 2023-05-28 NOTE — Discharge Instructions (Addendum)
 It was a pleasure caring for you today. Please follow up with your primary care provider. Seek emergency care if experiencing any new or worsening symptoms.

## 2023-05-29 ENCOUNTER — Encounter (HOSPITAL_COMMUNITY): Payer: Self-pay

## 2023-05-29 ENCOUNTER — Other Ambulatory Visit: Payer: Self-pay

## 2023-05-29 ENCOUNTER — Inpatient Hospital Stay (HOSPITAL_COMMUNITY)
Admission: EM | Admit: 2023-05-29 | Discharge: 2023-06-02 | DRG: 074 | Disposition: A | Discharge status: Home Health | Attending: Family Medicine | Admitting: Family Medicine

## 2023-05-29 DIAGNOSIS — I6523 Occlusion and stenosis of bilateral carotid arteries: Secondary | ICD-10-CM | POA: Diagnosis present

## 2023-05-29 DIAGNOSIS — I69320 Aphasia following cerebral infarction: Secondary | ICD-10-CM

## 2023-05-29 DIAGNOSIS — E7849 Other hyperlipidemia: Secondary | ICD-10-CM | POA: Diagnosis not present

## 2023-05-29 DIAGNOSIS — E039 Hypothyroidism, unspecified: Secondary | ICD-10-CM | POA: Diagnosis present

## 2023-05-29 DIAGNOSIS — Z91041 Radiographic dye allergy status: Secondary | ICD-10-CM

## 2023-05-29 DIAGNOSIS — E1143 Type 2 diabetes mellitus with diabetic autonomic (poly)neuropathy: Secondary | ICD-10-CM | POA: Diagnosis not present

## 2023-05-29 DIAGNOSIS — Z79899 Other long term (current) drug therapy: Secondary | ICD-10-CM

## 2023-05-29 DIAGNOSIS — G8191 Hemiplegia, unspecified affecting right dominant side: Secondary | ICD-10-CM

## 2023-05-29 DIAGNOSIS — Z794 Long term (current) use of insulin: Secondary | ICD-10-CM

## 2023-05-29 DIAGNOSIS — Z885 Allergy status to narcotic agent status: Secondary | ICD-10-CM

## 2023-05-29 DIAGNOSIS — Z880 Allergy status to penicillin: Secondary | ICD-10-CM

## 2023-05-29 DIAGNOSIS — I2510 Atherosclerotic heart disease of native coronary artery without angina pectoris: Secondary | ICD-10-CM | POA: Diagnosis present

## 2023-05-29 DIAGNOSIS — E119 Type 2 diabetes mellitus without complications: Secondary | ICD-10-CM | POA: Diagnosis not present

## 2023-05-29 DIAGNOSIS — R112 Nausea with vomiting, unspecified: Secondary | ICD-10-CM | POA: Diagnosis present

## 2023-05-29 DIAGNOSIS — Z86718 Personal history of other venous thrombosis and embolism: Secondary | ICD-10-CM | POA: Diagnosis not present

## 2023-05-29 DIAGNOSIS — D649 Anemia, unspecified: Secondary | ICD-10-CM | POA: Diagnosis present

## 2023-05-29 DIAGNOSIS — E66811 Obesity, class 1: Secondary | ICD-10-CM | POA: Diagnosis present

## 2023-05-29 DIAGNOSIS — Z6832 Body mass index (BMI) 32.0-32.9, adult: Secondary | ICD-10-CM

## 2023-05-29 DIAGNOSIS — E0590 Thyrotoxicosis, unspecified without thyrotoxic crisis or storm: Secondary | ICD-10-CM | POA: Diagnosis present

## 2023-05-29 DIAGNOSIS — I10 Essential (primary) hypertension: Secondary | ICD-10-CM | POA: Diagnosis present

## 2023-05-29 DIAGNOSIS — Z90710 Acquired absence of both cervix and uterus: Secondary | ICD-10-CM

## 2023-05-29 DIAGNOSIS — Z7901 Long term (current) use of anticoagulants: Secondary | ICD-10-CM

## 2023-05-29 DIAGNOSIS — R1084 Generalized abdominal pain: Secondary | ICD-10-CM | POA: Diagnosis not present

## 2023-05-29 DIAGNOSIS — Z993 Dependence on wheelchair: Secondary | ICD-10-CM

## 2023-05-29 DIAGNOSIS — Z886 Allergy status to analgesic agent status: Secondary | ICD-10-CM

## 2023-05-29 DIAGNOSIS — K219 Gastro-esophageal reflux disease without esophagitis: Secondary | ICD-10-CM | POA: Diagnosis not present

## 2023-05-29 DIAGNOSIS — I69391 Dysphagia following cerebral infarction: Secondary | ICD-10-CM

## 2023-05-29 DIAGNOSIS — K3184 Gastroparesis: Secondary | ICD-10-CM | POA: Diagnosis present

## 2023-05-29 DIAGNOSIS — G4733 Obstructive sleep apnea (adult) (pediatric): Secondary | ICD-10-CM

## 2023-05-29 DIAGNOSIS — R109 Unspecified abdominal pain: Principal | ICD-10-CM

## 2023-05-29 DIAGNOSIS — Z7902 Long term (current) use of antithrombotics/antiplatelets: Secondary | ICD-10-CM

## 2023-05-29 DIAGNOSIS — I5032 Chronic diastolic (congestive) heart failure: Secondary | ICD-10-CM | POA: Diagnosis present

## 2023-05-29 DIAGNOSIS — R509 Fever, unspecified: Secondary | ICD-10-CM | POA: Diagnosis not present

## 2023-05-29 DIAGNOSIS — Z8249 Family history of ischemic heart disease and other diseases of the circulatory system: Secondary | ICD-10-CM

## 2023-05-29 DIAGNOSIS — I6930 Unspecified sequelae of cerebral infarction: Secondary | ICD-10-CM

## 2023-05-29 DIAGNOSIS — F419 Anxiety disorder, unspecified: Secondary | ICD-10-CM | POA: Diagnosis present

## 2023-05-29 DIAGNOSIS — N179 Acute kidney failure, unspecified: Secondary | ICD-10-CM | POA: Diagnosis present

## 2023-05-29 DIAGNOSIS — F32A Depression, unspecified: Secondary | ICD-10-CM | POA: Diagnosis present

## 2023-05-29 DIAGNOSIS — Z833 Family history of diabetes mellitus: Secondary | ICD-10-CM

## 2023-05-29 DIAGNOSIS — I69354 Hemiplegia and hemiparesis following cerebral infarction affecting left non-dominant side: Secondary | ICD-10-CM

## 2023-05-29 DIAGNOSIS — I639 Cerebral infarction, unspecified: Secondary | ICD-10-CM | POA: Diagnosis present

## 2023-05-29 DIAGNOSIS — E785 Hyperlipidemia, unspecified: Secondary | ICD-10-CM | POA: Diagnosis present

## 2023-05-29 DIAGNOSIS — Z951 Presence of aortocoronary bypass graft: Secondary | ICD-10-CM

## 2023-05-29 DIAGNOSIS — G8114 Spastic hemiplegia affecting left nondominant side: Secondary | ICD-10-CM | POA: Diagnosis present

## 2023-05-29 DIAGNOSIS — I110 Hypertensive heart disease with heart failure: Secondary | ICD-10-CM | POA: Diagnosis present

## 2023-05-29 DIAGNOSIS — R000 Tachycardia, unspecified: Secondary | ICD-10-CM | POA: Diagnosis not present

## 2023-05-29 DIAGNOSIS — E1169 Type 2 diabetes mellitus with other specified complication: Secondary | ICD-10-CM

## 2023-05-29 DIAGNOSIS — Z888 Allergy status to other drugs, medicaments and biological substances status: Secondary | ICD-10-CM

## 2023-05-29 HISTORY — DX: Other symptoms and signs involving appearance and behavior: R46.89

## 2023-05-29 LAB — CBC
HCT: 37.3 % (ref 36.0–46.0)
HCT: 34.7 % — ABNORMAL LOW (ref 36.0–46.0)
Hemoglobin: 12.2 g/dL (ref 12.0–15.0)
Hemoglobin: 11.6 g/dL — ABNORMAL LOW (ref 12.0–15.0)
MCH: 26.6 pg (ref 26.0–34.0)
MCH: 26.9 pg (ref 26.0–34.0)
MCHC: 32.7 g/dL (ref 30.0–36.0)
MCHC: 33.4 g/dL (ref 30.0–36.0)
MCV: 81.3 fL (ref 80.0–100.0)
MCV: 80.5 fL (ref 80.0–100.0)
Platelets: 380 10*3/uL (ref 150–400)
Platelets: 341 10*3/uL (ref 150–400)
RBC: 4.59 MIL/uL (ref 3.87–5.11)
RBC: 4.31 MIL/uL (ref 3.87–5.11)
RDW: 15.7 % — ABNORMAL HIGH (ref 11.5–15.5)
RDW: 15.7 % — ABNORMAL HIGH (ref 11.5–15.5)
WBC: 11.8 10*3/uL — ABNORMAL HIGH (ref 4.0–10.5)
WBC: 10 10*3/uL (ref 4.0–10.5)
nRBC: 0 % (ref 0.0–0.2)
nRBC: 0 % (ref 0.0–0.2)

## 2023-05-29 LAB — I-STAT CHEM 8, ED
BUN: 13 mg/dL (ref 6–20)
Calcium, Ion: 1.16 mmol/L (ref 1.15–1.40)
Chloride: 103 mmol/L (ref 98–111)
Creatinine, Ser: 0.8 mg/dL (ref 0.44–1.00)
Glucose, Bld: 277 mg/dL — ABNORMAL HIGH (ref 70–99)
HCT: 40 % (ref 36.0–46.0)
Hemoglobin: 13.6 g/dL (ref 12.0–15.0)
Potassium: 3.9 mmol/L (ref 3.5–5.1)
Sodium: 137 mmol/L (ref 135–145)
TCO2: 21 mmol/L — ABNORMAL LOW (ref 22–32)

## 2023-05-29 LAB — TYPE AND SCREEN
ABO/RH(D): O POS
Antibody Screen: NEGATIVE

## 2023-05-29 LAB — COMPREHENSIVE METABOLIC PANEL WITH GFR
ALT: 17 U/L (ref 0–44)
AST: 18 U/L (ref 15–41)
Albumin: 3.2 g/dL — ABNORMAL LOW (ref 3.5–5.0)
Alkaline Phosphatase: 66 U/L (ref 38–126)
Anion gap: 15 (ref 5–15)
BUN: 12 mg/dL (ref 6–20)
CO2: 19 mmol/L — ABNORMAL LOW (ref 22–32)
Calcium: 8.7 mg/dL — ABNORMAL LOW (ref 8.9–10.3)
Chloride: 101 mmol/L (ref 98–111)
Creatinine, Ser: 0.79 mg/dL (ref 0.44–1.00)
GFR, Estimated: >60 mL/min (ref >60)
Glucose, Bld: 265 mg/dL — ABNORMAL HIGH (ref 70–99)
Potassium: 4 mmol/L (ref 3.5–5.1)
Sodium: 135 mmol/L (ref 135–145)
Total Bilirubin: 0.7 mg/dL (ref 0.0–1.2)
Total Protein: 6.8 g/dL (ref 6.5–8.1)

## 2023-05-29 LAB — GLUCOSE, CAPILLARY
Glucose-Capillary: 175 mg/dL — ABNORMAL HIGH (ref 70–99)
Glucose-Capillary: 218 mg/dL — ABNORMAL HIGH (ref 70–99)
Glucose-Capillary: 226 mg/dL — ABNORMAL HIGH (ref 70–99)

## 2023-05-29 LAB — LIPASE, BLOOD: Lipase: 22 U/L (ref 11–51)

## 2023-05-29 LAB — CBG MONITORING, ED: Glucose-Capillary: 255 mg/dL — ABNORMAL HIGH (ref 70–99)

## 2023-05-29 LAB — I-STAT VENOUS BLOOD GAS, ED
Acid-base deficit: 3 mmol/L — ABNORMAL HIGH (ref 0.0–2.0)
Bicarbonate: 21.9 mmol/L (ref 20.0–28.0)
Calcium, Ion: 1.14 mmol/L — ABNORMAL LOW (ref 1.15–1.40)
HCT: 38 % (ref 36.0–46.0)
Hemoglobin: 12.9 g/dL (ref 12.0–15.0)
O2 Saturation: 91 %
Potassium: 3.9 mmol/L (ref 3.5–5.1)
Sodium: 136 mmol/L (ref 135–145)
TCO2: 23 mmol/L (ref 22–32)
pCO2, Ven: 36.3 mmHg — ABNORMAL LOW (ref 44–60)
pH, Ven: 7.389 (ref 7.25–7.43)
pO2, Ven: 63 mmHg — ABNORMAL HIGH (ref 32–45)

## 2023-05-29 LAB — POC OCCULT BLOOD, ED: Fecal Occult Bld: NEGATIVE

## 2023-05-29 MED ORDER — SODIUM CHLORIDE 0.9 % IV BOLUS
1000.0000 mL | Freq: Once | INTRAVENOUS | Status: AC
Start: 2023-05-29 — End: 2023-05-29
  Administered 2023-05-29: 1000 mL via INTRAVENOUS

## 2023-05-29 MED ORDER — INSULIN ASPART 100 UNIT/ML IJ SOLN
0.0000 [IU] | INTRAMUSCULAR | Status: DC
  Administered 2023-05-29 (×2): 5 [IU] via SUBCUTANEOUS
  Administered 2023-05-30: 3 [IU] via SUBCUTANEOUS
  Administered 2023-05-30: 2 [IU] via SUBCUTANEOUS
  Administered 2023-05-30 (×3): 3 [IU] via SUBCUTANEOUS
  Administered 2023-05-31: 2 [IU] via SUBCUTANEOUS
  Administered 2023-05-31: 3 [IU] via SUBCUTANEOUS
  Administered 2023-05-31: 2 [IU] via SUBCUTANEOUS
  Administered 2023-05-31: 3 [IU] via SUBCUTANEOUS

## 2023-05-29 MED ORDER — LIDOCAINE HCL (PF) 1 % IJ SOLN
INTRAMUSCULAR | Status: AC
  Administered 2023-05-29: 5 mL
  Filled 2023-05-29: qty 5

## 2023-05-29 MED ORDER — ONDANSETRON HCL 4 MG/2ML IJ SOLN
4.0000 mg | Freq: Once | INTRAMUSCULAR | Status: AC
  Administered 2023-05-29: 4 mg via INTRAVENOUS
  Filled 2023-05-29: qty 2

## 2023-05-29 MED ORDER — METHIMAZOLE 5 MG PO TABS
5.0000 mg | ORAL_TABLET | Freq: Every morning | ORAL | Status: DC
  Administered 2023-05-30 – 2023-06-02 (×4): 5 mg via ORAL
  Filled 2023-05-29 (×4): qty 1

## 2023-05-29 MED ORDER — EZETIMIBE 10 MG PO TABS
10.0000 mg | ORAL_TABLET | Freq: Every day | ORAL | Status: DC
  Administered 2023-05-30 – 2023-06-02 (×4): 10 mg via ORAL
  Filled 2023-05-29 (×4): qty 1

## 2023-05-29 MED ORDER — ESCITALOPRAM OXALATE 10 MG PO TABS
20.0000 mg | ORAL_TABLET | Freq: Every morning | ORAL | Status: DC
  Administered 2023-05-30 – 2023-06-02 (×4): 20 mg via ORAL
  Filled 2023-05-29 (×4): qty 2

## 2023-05-29 MED ORDER — HYDROMORPHONE HCL 1 MG/ML IJ SOLN
0.5000 mg | Freq: Once | INTRAMUSCULAR | Status: AC
  Administered 2023-05-29: 0.5 mg via INTRAVENOUS
  Filled 2023-05-29: qty 1

## 2023-05-29 MED ORDER — HYDROMORPHONE HCL 1 MG/ML IJ SOLN
0.5000 mg | INTRAMUSCULAR | Status: DC | PRN
  Administered 2023-05-29 – 2023-06-01 (×9): 0.5 mg via INTRAVENOUS
  Filled 2023-05-29 (×11): qty 0.5

## 2023-05-29 MED ORDER — SODIUM CHLORIDE 0.9 % IV BOLUS
1000.0000 mL | Freq: Once | INTRAVENOUS | Status: AC
  Administered 2023-05-29: 1000 mL via INTRAVENOUS

## 2023-05-29 MED ORDER — TAMSULOSIN HCL 0.4 MG PO CAPS
0.4000 mg | ORAL_CAPSULE | Freq: Every day | ORAL | Status: DC
  Administered 2023-05-29 – 2023-06-01 (×4): 0.4 mg via ORAL
  Filled 2023-05-29 (×4): qty 1

## 2023-05-29 MED ORDER — ACETAMINOPHEN 325 MG PO TABS
650.0000 mg | ORAL_TABLET | Freq: Four times a day (QID) | ORAL | Status: DC | PRN
  Administered 2023-05-31: 650 mg via ORAL
  Filled 2023-05-29 (×2): qty 2

## 2023-05-29 MED ORDER — METOPROLOL TARTRATE 25 MG PO TABS
50.0000 mg | ORAL_TABLET | Freq: Two times a day (BID) | ORAL | Status: DC
  Administered 2023-05-29: 50 mg via ORAL
  Filled 2023-05-29: qty 2

## 2023-05-29 MED ORDER — SODIUM CHLORIDE 0.9 % IV BOLUS
500.0000 mL | Freq: Once | INTRAVENOUS | Status: AC
  Administered 2023-05-29: 500 mL via INTRAVENOUS

## 2023-05-29 MED ORDER — HYDROMORPHONE HCL 1 MG/ML IJ SOLN
0.5000 mg | Freq: Once | INTRAMUSCULAR | Status: AC
  Administered 2023-05-29: 0.5 mg via INTRAVENOUS

## 2023-05-29 MED ORDER — GABAPENTIN 100 MG PO CAPS
100.0000 mg | ORAL_CAPSULE | Freq: Three times a day (TID) | ORAL | Status: DC
  Administered 2023-05-29 – 2023-06-02 (×12): 100 mg via ORAL
  Filled 2023-05-29 (×12): qty 1

## 2023-05-29 MED ORDER — ALBUTEROL SULFATE (2.5 MG/3ML) 0.083% IN NEBU: 2.5000 mg | INHALATION_SOLUTION | Freq: Four times a day (QID) | RESPIRATORY_TRACT | Status: DC | PRN

## 2023-05-29 MED ORDER — ATORVASTATIN CALCIUM 40 MG PO TABS
80.0000 mg | ORAL_TABLET | Freq: Every day | ORAL | Status: DC
  Administered 2023-05-30 – 2023-06-02 (×4): 80 mg via ORAL
  Filled 2023-05-29 (×4): qty 2

## 2023-05-29 MED ORDER — ONDANSETRON HCL 4 MG/2ML IJ SOLN: 4.0000 mg | Freq: Once | INTRAMUSCULAR | Status: DC | PRN

## 2023-05-29 MED ORDER — RAMELTEON 8 MG PO TABS
8.0000 mg | ORAL_TABLET | Freq: Every day | ORAL | Status: DC
Start: 2023-05-29 — End: 2023-06-02
  Administered 2023-05-29 – 2023-06-01 (×4): 8 mg via ORAL
  Filled 2023-05-29 (×5): qty 1

## 2023-05-29 MED ORDER — PANTOPRAZOLE SODIUM 40 MG IV SOLR
80.0000 mg | Freq: Two times a day (BID) | INTRAVENOUS | Status: DC
  Administered 2023-05-29 – 2023-05-31 (×4): 80 mg via INTRAVENOUS
  Filled 2023-05-29 (×5): qty 20

## 2023-05-29 MED ORDER — BACLOFEN 10 MG PO TABS
10.0000 mg | ORAL_TABLET | Freq: Three times a day (TID) | ORAL | Status: DC
  Administered 2023-05-29 – 2023-06-02 (×12): 10 mg via ORAL
  Filled 2023-05-29 (×12): qty 1

## 2023-05-29 MED ORDER — HYDROMORPHONE HCL 1 MG/ML IJ SOLN
1.0000 mg | Freq: Once | INTRAMUSCULAR | Status: AC
  Administered 2023-05-29: 1 mg via INTRAVENOUS
  Filled 2023-05-29: qty 1

## 2023-05-29 MED ORDER — SODIUM CHLORIDE 0.9 % IV SOLN: INTRAVENOUS | Status: AC

## 2023-05-29 MED ORDER — ACETAMINOPHEN 650 MG RE SUPP: 650.0000 mg | Freq: Four times a day (QID) | RECTAL | Status: DC | PRN

## 2023-05-29 MED ORDER — LIDOCAINE-EPINEPHRINE (PF) 2 %-1:200000 IJ SOLN
10.0000 mL | Freq: Once | INTRAMUSCULAR | Status: AC
  Administered 2023-05-29: 10 mL
  Filled 2023-05-29: qty 20

## 2023-05-29 MED ORDER — METOCLOPRAMIDE HCL 5 MG/ML IJ SOLN: 10.0000 mg | Freq: Once | INTRAMUSCULAR | Status: DC

## 2023-05-29 MED ORDER — SODIUM CHLORIDE 0.9% FLUSH
3.0000 mL | Freq: Two times a day (BID) | INTRAVENOUS | Status: DC
  Administered 2023-05-29 – 2023-06-02 (×9): 3 mL via INTRAVENOUS

## 2023-05-29 MED ORDER — LIDOCAINE HCL (PF) 1 % IJ SOLN: 5.0000 mL | Freq: Once | INTRAMUSCULAR | Status: AC

## 2023-05-29 MED ORDER — POLYETHYLENE GLYCOL 3350 17 G PO PACK
17.0000 g | PACK | Freq: Every day | ORAL | Status: DC | PRN
  Administered 2023-06-01: 17 g via ORAL
  Filled 2023-05-29: qty 1

## 2023-05-29 MED ORDER — METOPROLOL TARTRATE 50 MG PO TABS
50.0000 mg | ORAL_TABLET | Freq: Two times a day (BID) | ORAL | Status: DC
  Administered 2023-05-30 – 2023-06-02 (×7): 50 mg via ORAL
  Filled 2023-05-29 (×7): qty 1

## 2023-05-29 NOTE — Progress Notes (Signed)
 Present with abd pain, hx of CVA and left-sided deficits, aphasia, hypertension, hypothyroidism, type 2 diabetes, CAD s/p CABG.    05/29/23 1539  TOC Brief Assessment  Insurance and Status Reviewed  Patient has primary care physician Yes  Home environment has been reviewed From home with husband.  Prior level of function: Requires assistance with ADL's. Receives PCS 3hours a day. Pt is Active with [facility] for HHPT/OT/RN services.  Prior/Current Home Services No current home services  Social Drivers of Health Review SDOH reviewed no interventions necessary  Readmission risk has been reviewed No  Transition of care needs transition of care needs identified, TOC will continue to follow   Pt gives permission to speak with husband if needed. TOC team following and will assist with needs.  Carlee Charters RN,BSN,CM

## 2023-05-29 NOTE — Inpatient Diabetes Management (Signed)
 Inpatient Diabetes Program Recommendations  AACE/ADA: New Consensus Statement on Inpatient Glycemic Control (2015)  Target Ranges:  Prepandial:   less than 140 mg/dL      Peak postprandial:   less than 180 mg/dL (1-2 hours)      Critically ill patients:  140 - 180 mg/dL   Lab Results  Component Value Date   GLUCAP 255 (H) 05/29/2023   HGBA1C 10.0 (H) 04/25/2023    Review of Glycemic Control  Latest Reference Range & Units 05/29/23 11:58  Glucose-Capillary 70 - 99 mg/dL 253 (H)   Diabetes history: DM 2 Outpatient Diabetes medications:  Lantus  10 units daily Humalog  4 units tid with meals  Current orders for Inpatient glycemic control:  None  Inpatient Diabetes Program Recommendations:    Note history of DM and use of insulin  at home.  Consider adding Novolog  0-9 units tid with meals and Semglee  10 units daily while in the hospital.   Thanks,  Josefa Ni, RN, BC-ADM Inpatient Diabetes Coordinator Pager [phone]  (8a-5p)

## 2023-05-29 NOTE — ED Notes (Signed)
 Updated husband on patient plan of care and room assignment.

## 2023-05-29 NOTE — H&P (Signed)
 History and Physical   Lori [name] OZH:[id number] DOB: 03/09/1970 DOA: 05/29/2023  PCP: Estil Heman, NP   Patient coming from: Home  Chief Complaint: Nausea, vomiting, abdominal pain  HPI: Lori [name] is a 52 y.o. female with medical history significant of hypertension, hyperlipidemia, GERD, diabetes, gastroparesis, CAD status post CABG, hypothyroidism, carotid artery disease, CVA, dysphagia, hemiplegia, wheelchair-bound, anemia, diastolic CHF, anxiety, depression, migraines, obesity, OSA, asthma, DVT, pheochromocytoma presenting with persistent nausea vomiting and abdominal pain.  Patient has had issues with persistent nausea vomiting in the past as well as gastroparesis.  She has had 5 days of nausea vomiting abdominal pain.  Was seen in the [facility], ED yesterday had a reassuring workup and CT scan there and was discharged home after tolerating p.o.  After returning home she has had recurrence of her nausea vomiting overnight and now is having some coffee ground emesis reportedly.  Pain is now lower in her abdomen than it was previously.  Reports reflux like pain. Denies fevers, chills, shortness of breath.  ED Course: Vital signs in the ED notable for heart rate in the 90s-140s, blood pressure in the 110s-150 systolic, respiratory rate in the teens to 20s.  Lab workup included CMP with bicarb 19, glucose 265, calcium  8.7, albumin  3.2.  CBC with leukocytosis to 11.8 increased from 7.7 yesterday.  FOBT negative.  Lipase normal.  Urinalysis pending.  Type and screen pending.  VBG with normal pH and pCO2 36.3.  CT on pelvis yesterday showed no acute abnormality.  Patient received Dilaudid , Zofran , 1 L IV fluids in the ED.  Review of Systems: As per HPI otherwise all other systems reviewed and are negative.  Past Medical History:  Diagnosis Date   Acute bronchitis 08/24/2021   Acute cystitis 12/26/2022   Acute metabolic encephalopathy  11/04/2022   Acute renal failure (ARF) (HCC) 04/27/2023   AKI (acute kidney injury) (HCC) 04/30/2022   Aspiration pneumonia (HCC) 10/13/2022   CAD (coronary artery disease)    Cerebral infarction, unspecified (HCC) 09/27/2012   Chest pain of uncertain etiology 10/06/2022   Depression    Diabetes mellitus without complication (HCC)    Diabetic hyperosmolar non-ketotic state (HCC) 01/19/2023   DKA (diabetic ketoacidosis) (HCC) 02/19/2021   History of CT scan    History of mammogram    History of MRI    Hypertension    Pheochromocytoma    Reversible cerebrovascular vasoconstriction syndrome    Spell of abnormal behavior    Stroke (HCC)    Thyroid  disease     Past Surgical History:  Procedure Laterality Date   ABDOMINAL HYSTERECTOMY     BIOPSY  02/06/2023   Procedure: BIOPSY;  Surgeon: Ace Holder, MD;  Location: [facility];  Service: Gastroenterology;;   CESAREAN SECTION     3   CORONARY ARTERY BYPASS GRAFT     ESOPHAGOGASTRODUODENOSCOPY (EGD) WITH PROPOFOL  N/A 02/06/2023   Procedure: ESOPHAGOGASTRODUODENOSCOPY (EGD) WITH PROPOFOL ;  Surgeon: Ace Holder, MD;  Location: [facility];  Service: Gastroenterology;  Laterality: N/A;   Right adrenal gland removal for pheochromocytoma Right     Social History  reports that she has never smoked. She has never used smokeless tobacco. She reports that she does not currently use alcohol. She reports that she does not use drugs.  Allergies  Allergen Reactions   Asa [Aspirin ] Anaphylaxis, Swelling and Other (See Comments)    Throat closes   Contrast Media [Iodinated Contrast Media] Anaphylaxis and Swelling  Imitrex [Sumatriptan] Anaphylaxis   Ms Contin  [Morphine ] Anaphylaxis and Swelling   Penicillins Swelling and Other (See Comments)    Mouth and eyes swell   Chlorhexidine  Gluconate [Chlorhexidine ] Itching and Rash   Firvanq  [Vancomycin ] Other (See Comments)    Red Man's Syndrome   Cipro  [Ciprofloxacin  Hcl] Nausea  And Vomiting   Nitrofuran Derivatives Anxiety   Wound Dressing Adhesive Itching and Rash    Family History  Problem Relation Age of Onset   Hypertension Mother    Diabetes Mother    Atrial fibrillation Mother    Hypertension Father    Heart attack Father    Dementia Father    Diabetes Brother    Brain cancer Brother    Asthma Daughter    Sickle cell anemia Son    Atrial fibrillation Maternal Uncle   Reviewed on admission  Prior to Admission medications   Medication Sig Start Date End Date Taking? Authorizing Provider  acetaminophen  (TYLENOL ) 325 MG tablet Take 2 tablets (650 mg total) by mouth every 6 (six) hours as needed for mild pain (pain score 1-3) (or Fever >/= 101). 02/12/23   Armenta Landau, MD  albuterol  (PROVENTIL ) (2.5 MG/3ML) 0.083% nebulizer solution Take 3 mLs (2.5 mg total) by nebulization every 6 (six) hours as needed for wheezing or shortness of breath. 01/01/23   Ngetich, Dinah C, NP  albuterol  (VENTOLIN  HFA) 108 (90 Base) MCG/ACT inhaler INHALE 2 PUFFS BY MOUTH EVERY 6 HOURS AS NEEDED FOR WHEEZING AND FOR SHORTNESS OF BREATH Strength: 108 (90 Base) MCG/ACT 01/01/23   Ngetich, Dinah C, NP  atorvastatin  (LIPITOR ) 80 MG tablet TAKE 1 TABLET (80 MG TOTAL) BY MOUTH DAILY. 03/31/23   Ngetich, Dinah C, NP  baclofen  (LIORESAL ) 10 MG tablet Take 1 tablet (10 mg total) by mouth 3 (three) times daily. 01/08/23   Ngetich, Dinah C, NP  clopidogrel  (PLAVIX ) 75 MG tablet TAKE 1 TABLET BY MOUTH DAILY 04/09/23   Ngetich, Dinah C, NP  Continuous Glucose Receiver (FREESTYLE LIBRE 2 READER) DEVI 1 Device by Does not apply route daily. E11.69 11/01/22   Ngetich, Dinah C, NP  Continuous Glucose Sensor (FREESTYLE LIBRE 14 DAY SENSOR) MISC 1 each by Does not apply route as needed. DX: E11.69 12/24/22   Ngetich, Dinah C, NP  cyclobenzaprine  (FLEXERIL ) 5 MG tablet Take 1 tablet (5 mg total) by mouth 3 (three) times daily as needed for muscle spasms. 05/07/23   Ngetich, Dinah C, NP  diclofenac   Sodium (VOLTAREN ) 1 % GEL Apply 1 Application topically 4 (four) times daily as needed (pain). 05/15/23   Ngetich, Dinah C, NP  escitalopram  (LEXAPRO ) 20 MG tablet Take 1 tablet (20 mg total) by mouth every morning. 01/01/23   Ngetich, Dinah C, NP  ezetimibe  (ZETIA ) 10 MG tablet TAKE 1 TABLET BY MOUTH DAILY 04/09/23   Ngetich, Dinah C, NP  ferrous sulfate  325 (65 FE) MG tablet Take 1 tablet (325 mg total) by mouth daily with breakfast. 05/15/23   Ngetich, Dinah C, NP  gabapentin  (NEURONTIN ) 100 MG capsule Take 100 mg by mouth 3 (three) times daily. 11/14/22   [provider]  HUMALOG  KWIKPEN 100 UNIT/ML KwikPen INJECT 4 UNITS SUBCUTANEOUSLY THREE TIMES A DAY WITH MEALS Patient taking differently: Inject 7 Units into the skin with breakfast, with lunch, and with evening meal. 05/07/23   Ngetich, Dinah C, NP  insulin  glargine (LANTUS  SOLOSTAR) 100 UNIT/ML Solostar Pen Inject 10 Units into the skin at bedtime. 05/07/23   Ngetich, Dinah C, NP  insulin  lispro (HUMALOG ) 100 UNIT/ML injection Sliding scale AC, HS CBG 121 - 150: 2 units CBG 151 - 200: 3 units CBG 201 - 250: 5 units CBG 251 - 300: 8 units CBG 301 - 350: 11 units CBG 351 - 400: 15 units CBG > 400: call MD 05/07/23   Ngetich, Dinah C, NP  methimazole  (TAPAZOLE ) 5 MG tablet TAKE 1 TABLET BY MOUTH EVERY  MORNING 04/09/23   Ngetich, Dinah C, NP  metoCLOPramide  (REGLAN ) 10 MG tablet Take 1 tablet (10 mg total) by mouth every 8 (eight) hours as needed for refractory nausea / vomiting. 05/13/23 05/12/24  Leona Rake, MD  metoprolol  tartrate (LOPRESSOR ) 50 MG tablet Take 1 tablet (50 mg total) by mouth 2 (two) times daily. 05/13/23 06/12/23  Leona Rake, MD  olmesartan -hydrochlorothiazide  (BENICAR  HCT) 40-25 MG tablet Take 1 tablet by mouth daily. 05/14/23   [provider]  ondansetron  (ZOFRAN -ODT) 4 MG disintegrating tablet Take 1 tablet (4 mg total) by mouth every 8 (eight) hours as needed for nausea. 05/28/23    Bend Bureau, PA-C   pantoprazole  (PROTONIX ) 40 MG tablet Take 1 tablet (40 mg total) by mouth 2 (two) times daily. 05/15/23   Ngetich, Dinah C, NP  potassium chloride  SA (KLOR-CON  M) 20 MEQ tablet Take 1 tablet (20 mEq total) by mouth 2 (two) times daily for 7 days. 05/13/23 05/20/23  Leona Rake, MD  ranolazine  (RANEXA ) 500 MG 12 hr tablet Take 1 tablet (500 mg total) by mouth 2 (two) times daily. 10/09/22 01/23/24  [place], Rema Care, DO  rivaroxaban  (XARELTO ) 10 MG TABS tablet TAKE 1 TABLET BY MOUTH DAILY 04/09/23   Ngetich, Dinah C, NP  senna-docusate (SENOKOT-S) 8.6-50 MG tablet Take 1 tablet by mouth 2 (two) times daily. 02/12/23   Armenta Landau, MD  sorbitol  70 % SOLN Take 30 mLs by mouth daily as needed for moderate constipation. Patient not taking: Reported on 05/15/2023 02/12/23   Armenta Landau, MD  sucralfate  (CARAFATE ) 1 g tablet Take 1 tablet (1 g total) by mouth 4 (four) times daily for 14 days. 05/13/23 05/27/23  Leona Rake, MD  tamsulosin  (FLOMAX ) 0.4 MG CAPS capsule Take 1 capsule (0.4 mg total) by mouth at bedtime. 05/07/23   Ngetich, Dinah C, NP  topiramate  (TOPAMAX ) 25 MG tablet Take 1 tablet (25 mg total) by mouth 2 (two) times daily. 07/10/22   Doreene Gammon D, DO    Physical Exam: Vitals:   05/29/23 1357 05/29/23 1415 05/29/23 1415 05/29/23 1429  BP:   (!) 145/100 (!) 164/107  Pulse: (!) 123 (!) 130 (!) 126 (!) 126  Resp:  13 12 20   Temp:    98.1 F (36.7 C)  TempSrc:      SpO2:  97% 98% 99%  Weight:      Height:        Physical Exam Constitutional:      General: She is not in acute distress.    Appearance: Normal appearance.  HENT:     Head: Normocephalic and atraumatic.     Mouth/Throat:     Mouth: Mucous membranes are moist.     Pharynx: Oropharynx is clear.  Eyes:     Extraocular Movements: Extraocular movements intact.     Pupils: Pupils are equal, round, and reactive to light.  Cardiovascular:     Rate and Rhythm: Regular rhythm. Tachycardia present.     Pulses: Normal  pulses.     Heart sounds: Normal heart sounds.  Pulmonary:  Effort: Pulmonary effort is normal. No respiratory distress.     Breath sounds: Normal breath sounds.  Abdominal:     General: Bowel sounds are normal. There is no distension.     Palpations: Abdomen is soft.     Tenderness: There is abdominal tenderness.  Musculoskeletal:        General: No swelling or deformity.  Skin:    General: Skin is warm and dry.  Neurological:     General: No focal deficit present.     Mental Status: Mental status is at baseline.    Labs on Admission: I have personally reviewed following labs and imaging studies  CBC: Recent Labs  Lab 05/28/23 1246 05/29/23 1257 05/29/23 1303  WBC 7.7 11.8*  --   NEUTROABS 4.1  --   --   HGB 12.4 12.2 13.6  12.9  HCT 37.4 37.3 40.0  38.0  MCV 80.6 81.3  --   PLT 433* 380  --     Basic Metabolic Panel: Recent Labs  Lab 05/28/23 1246 05/29/23 1257 05/29/23 1303  NA 134* 135 137  136  K 4.3 4.0 3.9  3.9  CL 98 101 103  CO2 26 19*  --   GLUCOSE 248* 265* 277*  BUN 27* 12 13  CREATININE 1.02* 0.79 0.80  CALCIUM  9.4 8.7*  --     GFR: Estimated Creatinine Clearance: 83 mL/min (by C-G formula based on SCr of 0.8 mg/dL).  Liver Function Tests: Recent Labs  Lab 05/28/23 1246 05/29/23 1257  AST 25 18  ALT 18 17  ALKPHOS 70 66  BILITOT 0.7 0.7  PROT 7.7 6.8  ALBUMIN  3.5 3.2*    Urine analysis:    Component Value Date/Time   COLORURINE YELLOW 05/28/2023 1613   APPEARANCEUR HAZY (A) 05/28/2023 1613   LABSPEC 1.011 05/28/2023 1613   PHURINE 5.0 05/28/2023 1613   GLUCOSEU NEGATIVE 05/28/2023 1613   HGBUR NEGATIVE 05/28/2023 1613   BILIRUBINUR NEGATIVE 05/28/2023 1613   KETONESUR NEGATIVE 05/28/2023 1613   PROTEINUR NEGATIVE 05/28/2023 1613   NITRITE NEGATIVE 05/28/2023 1613   LEUKOCYTESUR NEGATIVE 05/28/2023 1613    Radiological Exams on Admission: CT ABDOMEN PELVIS WO CONTRAST Result Date: 05/28/2023 CLINICAL DATA:   Abdominal pain, acute, nonlocalized EXAM: CT ABDOMEN AND PELVIS WITHOUT CONTRAST TECHNIQUE: Multidetector CT imaging of the abdomen and pelvis was performed following the standard protocol without IV contrast. RADIATION DOSE REDUCTION: This exam was performed according to the departmental dose-optimization program which includes automated exposure control, adjustment of the mA and/or kV according to patient size and/or use of iterative reconstruction technique. COMPARISON:  04/27/2023 FINDINGS: Lower chest: No pleural or pericardial effusion. Coronary and aortic calcifications, post CABG. Linear scarring or atelectasis in the right lower lobe. Hepatobiliary: No focal liver abnormality is seen. No gallstones, gallbladder wall thickening, or biliary dilatation. Stable punctate metallic density posterior to the intrahepatic IVC. Pancreas: Scattered coarse pancreatic parenchymal calcifications predominantly in the head. No discrete mass or regional inflammatory changes. Spleen: Normal in size without focal abnormality. Adrenals/Urinary Tract: .left adrenal gland unremarkable. Metallic clips in the region of the right adrenal, which is not visualized. Symmetric renal contours without urolithiasis or hydronephrosis. Urinary bladder partially distended. Stomach/Bowel: Small hiatal hernia. Stomach incompletely distended. duodenal diverticulum. Small bowel decompressed. Normal appendix. The colon is partially distended, with a few scattered descending and sigmoid diverticula; no adjacent inflammatory changes. Vascular/Lymphatic: Moderate calcified aortoiliac plaque without AAA. No abdominal or pelvic adenopathy. Reproductive: Status post hysterectomy. No adnexal masses. Other:  Stable 4 cm soft tissue density deep subcutaneous midline lesion in the anterior abdominal wall just above the symphysis pubis. No ascites. No free air. Musculoskeletal: Sternotomy wires. IMPRESSION: 1. No acute findings. 2. Colonic diverticulosis. 3.   Aortic Atherosclerosis (ICD10-I70.0). Electronically Signed   By: Nicoletta Barrier M.D.   On: 05/28/2023 15:23   EKG: Independently reviewed.  Sinus tachycardia at 143 bpm.  QTc prolonged at 594.  Nonspecific T wave changes.  Some baseline wander.  Assessment/Plan Active Problems:   Essential hypertension   Chronic diastolic CHF (congestive heart failure) (HCC)   OSA on CPAP   Diabetic gastroparesis (HCC)   CAD (coronary artery disease)   Hyperlipidemia   Spastic hemiparesis of left 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 (HCC)   History of CVA with residual deficit   GERD (gastroesophageal reflux disease)   Wheelchair dependence   Morbid obesity (HCC)   History of DVT (deep vein thrombosis)   Carotid stenosis, bilateral   S/P CABG x 2   Anxiety and depression   Insulin  dependent type 2 diabetes mellitus (HCC)   Intractable nausea and vomiting   Intractable nausea vomiting, abdominal pain Gastroparesis ?GI bleed QTc prolongation > Patient presenting with recurrent nausea vomiting and reporting some coffee-ground emesis.  Reporting some abdominal pain as well. > History of gastroparesis. > CT yesterday without acute abnormality. > Stable hemoglobin in the ED.  Leukocytosis 11.8 up from 7.7 yesterday unclear if reactive versus newly infected.  No fevers. > FOBT negative from rectal exam, will check vomitus once sample is available but no reported emesis since once when she first arrived. > Unfortunately QTc prolonged at 594 so we will have to hold off on most antiemetics - Monitor on telemetry overnight - Continue IV fluids - Repeat EKG now and in the morning - Can consider Ativan  for recurrent nausea, while QTc prolonged. - Check occult blood of emesis when it recurs - Trend CBC - IV PPI - Hold Plavix  and Xarelto  for now  Tachycardia > Insetting of recurrent nausea vomiting and pain as above.  Has had a liter of fluids will continue with fluids. - Continue IV fluids -  Monitor response to IV PPI / IV PRN pain medication - Trending EKG as above  Hypertension - Will hold olmesartan -hydrochlorothiazide  for now - Continue home metoprolol   Hyperlipidemia - Continue home atorvastatin , Zetia   History of CVA > Residual dysphagia, left hemiparesis, wheelchair-bound - Continue home atorvastatin , Zetia  - Holding Plavix  and Xarelto  as above - Dys 3 diet last admission (currently holding off while eval for GI bleed), take pill whole in apple sauce.  Diabetes - SSI  CAD > Status post CABG - Continue home atorvastatin , Zetia , metoprolol  - Holding olmesartan  as above - Holding Plavix  and Xarelto  in the setting of possible bleed  Hyperthyroidism - Continue methimazole   Carotid artery disease - Continue atorvastatin , Zetia   Anemia > Hemoglobin currently stable/normal - Trend CBC  Chronic diastolic CHF? > Last echo was in September 2024 with EF 60-65%, normal diastolic parameters, normal RV function.  Anxiety Depression - Continue home Lexapro   Migraines - Continue Topamax   OSA - Not on CPAP per chart  Asthma - Continue home as needed albuterol   History of DVT - Noted  History of pheochromocytoma > Status post right adrenalectomy - Noted  DVT prophylaxis: SCDs Code Status:   Full Family Communication:  None on admission, Attempted to reach spouse by phone but there was no answer  Disposition Plan:   Patient  is from:  Home  Anticipated DC to:  Home  Anticipated DC date:  1 to 3 days  Anticipated DC barriers: None  Consults called:  None Admission status:  Observation, telemetry  Severity of Illness: The appropriate patient status for this patient is OBSERVATION. Observation status is judged to be reasonable and necessary in order to provide the required intensity of service to ensure the patient's safety. The patient's presenting symptoms, physical exam findings, and initial radiographic and laboratory data in the context of their  medical condition is felt to place them at decreased risk for further clinical deterioration. Furthermore, it is anticipated that the patient will be medically stable for discharge from the hospital within 2 midnights of admission.    Johnetta Nab MD [facility]  How to contact the [facility] Attending or Consulting provider 7A - 7P or covering provider during after hours 7P -7A, for this patient?   Check the care team in [facility] and look for a) attending/consulting [facility] provider listed and b) the [facility] team listed Log into [email or web address] and use [place]'s universal password to access. If you do not have the password, please contact the hospital operator. Locate the [facility] provider you are looking for under [facility] and page to a number that you can be directly reached. If you still have difficulty reaching the provider, please page the [facility] (Director on Call) for the Hospitalists listed on amion for assistance.  05/29/2023, 2:56 PM

## 2023-05-29 NOTE — Progress Notes (Signed)
 IVT consult placed for PIV placement. Attempted with US  x3 times - blood return noted with 2 attempts, unable to thread catheter due to torturous veins. Of note, patient is restricted to her right arm, and recently had PICC line during last hospitalization. Lots of scar tissue noted.   PA informed and recommended EJ placement since peripheral options are extremely limited.   Tyerra Loretto R Martavia Tye, RN

## 2023-05-29 NOTE — ED Provider Notes (Signed)
 [place] [facility] [facility] Provider Note   CSN: [id number] Arrival date & time: 05/29/23  2956     History {Add pertinent medical, surgical, social history, OB history to HPI:1} Chief Complaint  Patient presents with  . Emesis  . Nausea  . Abdominal Pain   HPI Lori [name] is a 53 y.o. female  history of CVA and left-sided deficits, aphasia, hypertension, hypothyroidism, type 2 diabetes, gastroparesis, CAD s/p CABG presenting for nausea and vomiting abdominal pain.  Has been going on for 5 days.  Was seen yesterday at [facility] for same.  Workup in CT scan was reassuring.  Patient was discharged.  She returns stating vomiting has been persistent throughout the night now having coffee-ground emesis.  States she is now having lower abdominal pain as well but yesterday pain was more in the upper abdomen.   Emesis Associated symptoms: abdominal pain   Abdominal Pain Associated symptoms: vomiting        Home Medications Prior to Admission medications   Medication Sig Start Date End Date Taking? Authorizing Provider  acetaminophen  (TYLENOL ) 325 MG tablet Take 2 tablets (650 mg total) by mouth every 6 (six) hours as needed for mild pain (pain score 1-3) (or Fever >/= 101). 02/12/23   Armenta Landau, MD  albuterol  (PROVENTIL ) (2.5 MG/3ML) 0.083% nebulizer solution Take 3 mLs (2.5 mg total) by nebulization every 6 (six) hours as needed for wheezing or shortness of breath. 01/01/23   Ngetich, Dinah C, NP  albuterol  (VENTOLIN  HFA) 108 (90 Base) MCG/ACT inhaler INHALE 2 PUFFS BY MOUTH EVERY 6 HOURS AS NEEDED FOR WHEEZING AND FOR SHORTNESS OF BREATH Strength: 108 (90 Base) MCG/ACT 01/01/23   Ngetich, Dinah C, NP  atorvastatin  (LIPITOR ) 80 MG tablet TAKE 1 TABLET (80 MG TOTAL) BY MOUTH DAILY. 03/31/23   Ngetich, Dinah C, NP  baclofen  (LIORESAL ) 10 MG tablet Take 1 tablet (10 mg total) by mouth 3 (three) times daily. 01/08/23   Ngetich,  Dinah C, NP  clopidogrel  (PLAVIX ) 75 MG tablet TAKE 1 TABLET BY MOUTH DAILY 04/09/23   Ngetich, Dinah C, NP  Continuous Glucose Receiver (FREESTYLE LIBRE 2 READER) DEVI 1 Device by Does not apply route daily. E11.69 11/01/22   Ngetich, Dinah C, NP  Continuous Glucose Sensor (FREESTYLE LIBRE 14 DAY SENSOR) MISC 1 each by Does not apply route as needed. DX: E11.69 12/24/22   Ngetich, Dinah C, NP  cyclobenzaprine  (FLEXERIL ) 5 MG tablet Take 1 tablet (5 mg total) by mouth 3 (three) times daily as needed for muscle spasms. 05/07/23   Ngetich, Dinah C, NP  diclofenac  Sodium (VOLTAREN ) 1 % GEL Apply 1 Application topically 4 (four) times daily as needed (pain). 05/15/23   Ngetich, Dinah C, NP  escitalopram  (LEXAPRO ) 20 MG tablet Take 1 tablet (20 mg total) by mouth every morning. 01/01/23   Ngetich, Dinah C, NP  ezetimibe  (ZETIA ) 10 MG tablet TAKE 1 TABLET BY MOUTH DAILY 04/09/23   Ngetich, Dinah C, NP  ferrous sulfate  325 (65 FE) MG tablet Take 1 tablet (325 mg total) by mouth daily with breakfast. 05/15/23   Ngetich, Dinah C, NP  gabapentin  (NEURONTIN ) 100 MG capsule Take 100 mg by mouth 3 (three) times daily. 11/14/22   [provider]  HUMALOG  KWIKPEN 100 UNIT/ML KwikPen INJECT 4 UNITS SUBCUTANEOUSLY THREE TIMES A DAY WITH MEALS Patient taking differently: Inject 7 Units into the skin with breakfast, with lunch, and with evening meal. 05/07/23   Ngetich,  Dinah C, NP  insulin  glargine (LANTUS  SOLOSTAR) 100 UNIT/ML Solostar Pen Inject 10 Units into the skin at bedtime. 05/07/23   Ngetich, Dinah C, NP  insulin  lispro (HUMALOG ) 100 UNIT/ML injection Sliding scale AC, HS CBG 121 - 150: 2 units CBG 151 - 200: 3 units CBG 201 - 250: 5 units CBG 251 - 300: 8 units CBG 301 - 350: 11 units CBG 351 - 400: 15 units CBG > 400: call MD 05/07/23   Ngetich, Dinah C, NP  methimazole  (TAPAZOLE ) 5 MG tablet TAKE 1 TABLET BY MOUTH EVERY  MORNING 04/09/23   Ngetich, Dinah C, NP  metoCLOPramide  (REGLAN ) 10 MG tablet Take 1 tablet (10  mg total) by mouth every 8 (eight) hours as needed for refractory nausea / vomiting. 05/13/23 05/12/24  Adhikari, Amrit, MD  metoprolol  tartrate (LOPRESSOR ) 50 MG tablet Take 1 tablet (50 mg total) by mouth 2 (two) times daily. 05/13/23 06/12/23  Leona Rake, MD  olmesartan -hydrochlorothiazide  (BENICAR  HCT) 40-25 MG tablet Take 1 tablet by mouth daily. 05/14/23   [provider]  ondansetron  (ZOFRAN -ODT) 4 MG disintegrating tablet Take 1 tablet (4 mg total) by mouth every 8 (eight) hours as needed for nausea. 05/28/23   [place] Bureau, PA-C  pantoprazole  (PROTONIX ) 40 MG tablet Take 1 tablet (40 mg total) by mouth 2 (two) times daily. 05/15/23   Ngetich, Dinah C, NP  potassium chloride  SA (KLOR-CON  M) 20 MEQ tablet Take 1 tablet (20 mEq total) by mouth 2 (two) times daily for 7 days. 05/13/23 05/20/23  Leona Rake, MD  ranolazine  (RANEXA ) 500 MG 12 hr tablet Take 1 tablet (500 mg total) by mouth 2 (two) times daily. 10/09/22 01/23/24  [place], Rema Care, DO  rivaroxaban  (XARELTO ) 10 MG TABS tablet TAKE 1 TABLET BY MOUTH DAILY 04/09/23   Ngetich, Dinah C, NP  senna-docusate (SENOKOT-S) 8.6-50 MG tablet Take 1 tablet by mouth 2 (two) times daily. 02/12/23   Armenta Landau, MD  sorbitol  70 % SOLN Take 30 mLs by mouth daily as needed for moderate constipation. Patient not taking: Reported on 05/15/2023 02/12/23   Armenta Landau, MD  sucralfate  (CARAFATE ) 1 g tablet Take 1 tablet (1 g total) by mouth 4 (four) times daily for 14 days. 05/13/23 05/27/23  Leona Rake, MD  tamsulosin  (FLOMAX ) 0.4 MG CAPS capsule Take 1 capsule (0.4 mg total) by mouth at bedtime. 05/07/23   Ngetich, Dinah C, NP  topiramate  (TOPAMAX ) 25 MG tablet Take 1 tablet (25 mg total) by mouth 2 (two) times daily. 07/10/22   Doreene Gammon D, DO      Allergies    Liz Rigger ], Contrast media [iodinated contrast media], Imitrex [sumatriptan], Ms contin  [morphine ], Penicillins, Chlorhexidine  gluconate [chlorhexidine ], Firvanq  [vancomycin ],  Cipro  [ciprofloxacin  hcl], Nitrofuran derivatives, and Wound dressing adhesive    Review of Systems   Review of Systems  Gastrointestinal:  Positive for abdominal pain and vomiting.    Physical Exam   Vitals:   05/29/23 0925  BP: (!) 151/103  Pulse: (!) 139  Resp: 19  Temp: 98.9 F (37.2 C)  SpO2: 100%    CONSTITUTIONAL:  well-appearing, NAD NEURO:  Alert and oriented x 3, CN 3-12 grossly intact EYES:  eyes equal and reactive ENT/NECK:  Supple, no stridor  CARDIO:  Tachycardic and regular rhythm, appears well-perfused  PULM:  No respiratory distress, CTAB GI/GU:  non-distended, soft, generalized TTP MSK/SPINE:  No gross deformities, no edema, moves all extremities  SKIN:  no rash, atraumatic   *  Additional and/or pertinent findings included in MDM below    ED Results / Procedures / Treatments   Labs (all labs ordered are listed, but only abnormal results are displayed) Labs Reviewed  LIPASE, BLOOD  COMPREHENSIVE METABOLIC PANEL WITH GFR  CBC  URINALYSIS, ROUTINE W REFLEX MICROSCOPIC  I-STAT CHEM 8, ED  I-STAT VENOUS BLOOD GAS, ED  TYPE AND SCREEN    EKG EKG Interpretation Date/Time:  Thursday May 29 2023 09:24:02 EDT Ventricular Rate:  143 PR Interval:  88 QRS Duration:  82 QT Interval:  385 QTC Calculation: 594 R Axis:   102  Text Interpretation: Sinus tachycardia Right axis deviation Borderline repolarization abnormality Prolonged QT interval Since last tracing rate faster Confirmed by Trish Furl [phone]) on 05/29/2023 9:28:32 AM  Radiology CT ABDOMEN PELVIS WO CONTRAST Result Date: 05/28/2023 CLINICAL DATA:  Abdominal pain, acute, nonlocalized EXAM: CT ABDOMEN AND PELVIS WITHOUT CONTRAST TECHNIQUE: Multidetector CT imaging of the abdomen and pelvis was performed following the standard protocol without IV contrast. RADIATION DOSE REDUCTION: This exam was performed according to the departmental dose-optimization program which includes automated exposure  control, adjustment of the mA and/or kV according to patient size and/or use of iterative reconstruction technique. COMPARISON:  04/27/2023 FINDINGS: Lower chest: No pleural or pericardial effusion. Coronary and aortic calcifications, post CABG. Linear scarring or atelectasis in the right lower lobe. Hepatobiliary: No focal liver abnormality is seen. No gallstones, gallbladder wall thickening, or biliary dilatation. Stable punctate metallic density posterior to the intrahepatic IVC. Pancreas: Scattered coarse pancreatic parenchymal calcifications predominantly in the head. No discrete mass or regional inflammatory changes. Spleen: Normal in size without focal abnormality. Adrenals/Urinary Tract: .left adrenal gland unremarkable. Metallic clips in the region of the right adrenal, which is not visualized. Symmetric renal contours without urolithiasis or hydronephrosis. Urinary bladder partially distended. Stomach/Bowel: Small hiatal hernia. Stomach incompletely distended. duodenal diverticulum. Small bowel decompressed. Normal appendix. The colon is partially distended, with a few scattered descending and sigmoid diverticula; no adjacent inflammatory changes. Vascular/Lymphatic: Moderate calcified aortoiliac plaque without AAA. No abdominal or pelvic adenopathy. Reproductive: Status post hysterectomy. No adnexal masses. Other: Stable 4 cm soft tissue density deep subcutaneous midline lesion in the anterior abdominal wall just above the symphysis pubis. No ascites. No free air. Musculoskeletal: Sternotomy wires. IMPRESSION: 1. No acute findings. 2. Colonic diverticulosis. 3.  Aortic Atherosclerosis (ICD10-I70.0). Electronically Signed   By: Nicoletta Barrier M.D.   On: 05/28/2023 15:23    Procedures Procedures  {Document cardiac monitor, telemetry assessment procedure when appropriate:1}  Medications Ordered in ED Medications  ondansetron  (ZOFRAN ) injection 4 mg (has no administration in time range)    ED Course/  Medical Decision Making/ A&P   {   Click here for ABCD2, HEART and other calculatorsREFRESH Note before signing :1}                              Medical Decision Making Amount and/or Complexity of Data Reviewed Labs: ordered.  Risk Prescription drug management.   ***  {Document critical care time when appropriate:1} {Document review of labs and clinical decision tools ie heart score, Chads2Vasc2 etc:1}  {Document your independent review of radiology images, and any outside records:1} {Document your discussion with family members, caretakers, and with consultants:1} {Document social determinants of health affecting pt's care:1} {Document your decision making why or why not admission, treatments were needed:1} Final Clinical Impression(s) / ED Diagnoses Final diagnoses:  None  Rx / DC Orders ED Discharge Orders     None

## 2023-05-29 NOTE — ED Provider Notes (Addendum)
 I provided a substantive portion of the care of this patient.  I personally made/approved the management plan for this patient and take responsibility for the patient management.  EKG Interpretation Date/Time:  Thursday May 29 2023 09:24:02 EDT Ventricular Rate:  143 PR Interval:  88 QRS Duration:  82 QT Interval:  385 QTC Calculation: 594 R Axis:   102  Text Interpretation: Sinus tachycardia Right axis deviation Borderline repolarization abnormality Prolonged QT interval Since last tracing rate faster Confirmed by Trish Furl [phone]) on 05/29/2023 9:28:32 AM  Patient presents ED with recurrent complaints of abdominal pain.  Patient noted to be persistently tachycardic in the ED.  Multiple attempts were placed for IV access unsuccessfully.  Patient does not have an external jugular vein accessible.  Internal jugular collapses readily with respirations.  Considered femoral line access however patient is incontinent of urine.  Will plan on IO for initial access fluid resuscitation.  Will plan on reattempt for IV access after patient has been fluid resuscitated   IO LINE INSERTION  Date/Time: 05/29/2023 11:33 AM  Performed by: Trish Furl, MD Authorized by: Trish Furl, MD   Consent:    Consent obtained:  Verbal   Consent given by:  Patient   Risks, benefits, and alternatives were discussed: yes     Risks discussed:  Bleeding and infection   Alternatives discussed:  Delayed treatment Universal protocol:    Procedure explained and questions answered to patient or proxy's satisfaction: yes     Patient identity confirmed:  Verbally with patient Pre-procedure details:    Site preparation:  Chlorhexidine    Preparation: Patient was prepped and draped in usual sterile fashion   Anesthesia:    Anesthesia method:  Local infiltration   Local anesthetic:  Lidocaine  2% WITH epi Procedure details:    Insertion site:  L proximal tibia   Insertion: Needle was inserted through the bony cortex      Number of attempts:  1   Insertion confirmation:  Aspiration of blood/marrow and easy infusion of fluids Post-procedure details:    Secured with:  Protective shield and transparent dressing   Procedure completion:  Tolerated well, no immediate complications Comments:     Pain with fluid infusion.  Lidocaine  2 ml into bone marrow for analgesia   Trish Furl, MD 05/29/23 1118    Trish Furl, MD 05/29/23 1137

## 2023-05-29 NOTE — ED Triage Notes (Signed)
 Pt BIBGEMS from home via EMS for abdominal pain with nausea and vomitting for 5 days. Has been unable to take home medications. Pt is alert and oriented speaking in full sentences. Pt is having coffee ground emesis.   CBG 221 180/110 150 hr Capno 35

## 2023-05-30 DIAGNOSIS — R109 Unspecified abdominal pain: Secondary | ICD-10-CM

## 2023-05-30 DIAGNOSIS — E1143 Type 2 diabetes mellitus with diabetic autonomic (poly)neuropathy: Secondary | ICD-10-CM | POA: Diagnosis not present

## 2023-05-30 DIAGNOSIS — K3184 Gastroparesis: Secondary | ICD-10-CM | POA: Diagnosis not present

## 2023-05-30 DIAGNOSIS — R112 Nausea with vomiting, unspecified: Secondary | ICD-10-CM | POA: Diagnosis not present

## 2023-05-30 LAB — COMPREHENSIVE METABOLIC PANEL WITH GFR
ALT: 16 U/L (ref 0–44)
AST: 17 U/L (ref 15–41)
Albumin: 2.7 g/dL — ABNORMAL LOW (ref 3.5–5.0)
Alkaline Phosphatase: 56 U/L (ref 38–126)
Anion gap: 9 (ref 5–15)
BUN: 7 mg/dL (ref 6–20)
CO2: 18 mmol/L — ABNORMAL LOW (ref 22–32)
Calcium: 8 mg/dL — ABNORMAL LOW (ref 8.9–10.3)
Chloride: 108 mmol/L (ref 98–111)
Creatinine, Ser: 0.6 mg/dL (ref 0.44–1.00)
GFR, Estimated: >60 mL/min (ref >60)
Glucose, Bld: 205 mg/dL — ABNORMAL HIGH (ref 70–99)
Potassium: 4 mmol/L (ref 3.5–5.1)
Sodium: 135 mmol/L (ref 135–145)
Total Bilirubin: 0.8 mg/dL (ref 0.0–1.2)
Total Protein: 5.8 g/dL — ABNORMAL LOW (ref 6.5–8.1)

## 2023-05-30 LAB — GLUCOSE, CAPILLARY
Glucose-Capillary: 123 mg/dL — ABNORMAL HIGH (ref 70–99)
Glucose-Capillary: 158 mg/dL — ABNORMAL HIGH (ref 70–99)
Glucose-Capillary: 178 mg/dL — ABNORMAL HIGH (ref 70–99)
Glucose-Capillary: 181 mg/dL — ABNORMAL HIGH (ref 70–99)
Glucose-Capillary: 88 mg/dL (ref 70–99)
Glucose-Capillary: 98 mg/dL (ref 70–99)

## 2023-05-30 LAB — CBC
HCT: 33.8 % — ABNORMAL LOW (ref 36.0–46.0)
Hemoglobin: 11.2 g/dL — ABNORMAL LOW (ref 12.0–15.0)
MCH: 26.9 pg (ref 26.0–34.0)
MCHC: 33.1 g/dL (ref 30.0–36.0)
MCV: 81.3 fL (ref 80.0–100.0)
Platelets: 322 10*3/uL (ref 150–400)
RBC: 4.16 MIL/uL (ref 3.87–5.11)
RDW: 15.8 % — ABNORMAL HIGH (ref 11.5–15.5)
WBC: 10.4 10*3/uL (ref 4.0–10.5)
nRBC: 0 % (ref 0.0–0.2)

## 2023-05-30 MED ORDER — CLOPIDOGREL BISULFATE 75 MG PO TABS
75.0000 mg | ORAL_TABLET | Freq: Every day | ORAL | Status: DC
  Administered 2023-05-31 – 2023-06-02 (×3): 75 mg via ORAL
  Filled 2023-05-30 (×3): qty 1

## 2023-05-30 MED ORDER — ONDANSETRON HCL 4 MG/2ML IJ SOLN
4.0000 mg | Freq: Once | INTRAMUSCULAR | Status: AC | PRN
  Administered 2023-05-31: 4 mg via INTRAVENOUS
  Filled 2023-05-30: qty 2

## 2023-05-30 MED ORDER — CALCIUM CARBONATE ANTACID 500 MG PO CHEW: 1.0000 | CHEWABLE_TABLET | Freq: Two times a day (BID) | ORAL | Status: DC | PRN

## 2023-05-30 MED ORDER — RIVAROXABAN 10 MG PO TABS
10.0000 mg | ORAL_TABLET | Freq: Every day | ORAL | Status: DC
  Administered 2023-05-31 – 2023-06-02 (×3): 10 mg via ORAL
  Filled 2023-05-30 (×3): qty 1

## 2023-05-30 MED ORDER — SODIUM CHLORIDE 0.9 % IV SOLN: INTRAVENOUS | Status: AC

## 2023-05-30 NOTE — Care Management Obs Status (Signed)
 MEDICARE OBSERVATION STATUS NOTIFICATION   Patient Details  Name: Lori [name] MRN: [id number] Date of Birth: 27-Mar-1970   Medicare Observation Status Notification Given:  Yes    [name] 05/30/2023, 2:36 PM

## 2023-05-30 NOTE — Progress Notes (Signed)
 4/25 Patient unable to physically sign, the patient's husband provided the signature. The terms and conditions were explained to both the patient and husband.

## 2023-05-30 NOTE — Progress Notes (Signed)
 PROGRESS NOTE                                                                                                                                                                                                             Patient Demographics:    Lori [name], is a 53 y.o. female, DOB - November 28, 1970, WUJ:[id number]  Outpatient Primary MD for the patient is Ngetich, Elijio Gu[place], NP    LOS - 0  Admit date - 05/29/2023    Chief Complaint  Patient presents with   Emesis   Nausea   Abdominal Pain       Brief Narrative (HPI from H&P)   Lori [name] is a 53 y.o. female with medical history significant of hypertension, hyperlipidemia, GERD, diabetes, gastroparesis, CAD status post CABG, hypothyroidism, carotid artery disease, CVA, dysphagia, hemiplegia, wheelchair-bound, anemia, diastolic CHF, anxiety, depression, migraines, obesity, OSA, asthma, DVT, pheochromocytoma presenting with persistent nausea vomiting and abdominal pain.   Patient has had issues with persistent nausea vomiting in the past as well as gastroparesis.  She has had 5 days of nausea vomiting abdominal pain.  Was seen in the [facility], ED yesterday had a reassuring workup and CT scan there and was discharged home after tolerating p.o.  After returning home she has had recurrence of her nausea vomiting overnight and now is having some coffee ground emesis reportedly.  Pain is now lower in her abdomen than it was previously.   Reports reflux like pain. Denies fevers, chills, shortness of breath.   ED Course: Vital signs in the ED notable for heart rate in the 90s-140s, blood pressure in the 110s-150 systolic, respiratory rate in the teens to 20s.  Lab workup included CMP with bicarb 19, glucose 265, calcium  8.7, albumin  3.2.  CBC with leukocytosis to 11.8 increased from 7.7 yesterday.  FOBT negative.  Lipase normal.  Urinalysis pending.  Type and  screen pending.  VBG with normal pH and pCO2 36.3.  CT on pelvis yesterday showed no acute abnormality.  Patient received Dilaudid , Zofran , 1 L IV fluids in the ED.   Subjective:    Kensly [name] was evaluated at the bed side.  Laying comfortably in bed.  Patient states she wants regular food and not the clear liquid diet.  Discussed plan to advance her diet slowly to decrease her risk  of worsening abdominal pain, nausea or vomiting.  Patient agreeable to advance diet slowly.   Assessment  & Plan :    Assessment and Plan: # Intractable nausea vomiting, abdominal pain # Gastroparesis - Patient presenting with recurrent nausea vomiting and reporting some coffee-ground emesis - Multiple hospitalizations for nausea and vomiting in the setting of gastroparesis - Patient did not tolerate advancing diet per RN - Continue IV PPI for now, will discontinue tomorrow if no evidence of further hematemesis - Continue IV fluids - Advance diet as tolerated, plan to advance slowly - IV Dilaudid  as needed for pain  # QTc prolongation - QTc prolonged at 594 on admission, improved to 500 today. - Judicious use of antiemetics - Monitor on telemetry overnight  # Hypertension BP remains soft with SBP 100s to 110s. - Continue to hold olmesartan -hydrochlorothiazide  for now - Continue home metoprolol    # Hyperlipidemia - Continue home atorvastatin , Zetia    # History of CVA - Residual dysphagia, left hemiparesis, wheelchair-bound - Continue home atorvastatin , Zetia  -Hemoglobin remained stable provide no evidence of GI bleed. - Resume Plavix  and Xarelto  tomorrow - Dys 3 diet last admission, take pill whole in apple sauce.   # Diabetes - Continue to hold long-acting insulin  - CBGs in the 80s to 170s - Continue every 4 hours SSI with CBG monitoring   # CAD - Status post CABG - Continue home atorvastatin , Zetia , metoprolol  - Resume Plavix  and Xarelto  tomorrow as above   # Hyperthyroidism -  Continue methimazole    # Carotid artery disease - Continue atorvastatin , Zetia    # Normocytic anemia -Hemoglobin stable at 11.2 from 11.6 on admission. - Trend CBC   # Chronic diastolic CHF > Last echo was in September 2024 with EF 60-65%, normal diastolic parameters, normal RV function.   # Anxiety # Depression - Continue home Lexapro    # Migraines - Continue Topamax    # OSA - Not on CPAP per chart   # Asthma - Continue home as needed albuterol    # History of DVT - Resume home Xarelto  tomorrow   # History of pheochromocytoma - Status post right adrenalectomy  # Class I obesity: Estimated body mass index is 32.66 kg/m as calculated from the following:   Height as of this encounter: 5\' 2"  (1.575 m).   Weight as of this encounter: 81 kg.          Condition -stable  Family Communication  : No family at bedside  Code Status : Full  Consults  : None  PUD Prophylaxis : Protonix    Procedures  :     None      Disposition Plan  :    Status is: Observation The patient remains OBS appropriate and will d/c before 2 midnights.  DVT Prophylaxis  :    SCDs Start: 05/29/23 1443     Lab Results  Component Value Date   PLT 341 05/29/2023    Diet :  Diet Order             Diet NPO time specified Except for: Sips with Meds  Diet effective now                    Inpatient Medications  Scheduled Meds:  atorvastatin   80 mg Oral Daily   baclofen   10 mg Oral TID   escitalopram   20 mg Oral q morning   ezetimibe   10 mg Oral Daily   gabapentin   100 mg Oral TID  insulin  aspart  0-15 Units Subcutaneous Q4H   methimazole   5 mg Oral q morning   metoprolol  tartrate  50 mg Oral BID   pantoprazole  (PROTONIX ) IV  80 mg Intravenous Q12H   ramelteon   8 mg Oral QHS   sodium chloride  flush  3 mL Intravenous Q12H   tamsulosin   0.4 mg Oral QHS   Continuous Infusions: PRN Meds:.acetaminophen  **OR** acetaminophen , albuterol , HYDROmorphone  (DILAUDID )  injection, polyethylene glycol  Antibiotics  :    Anti-infectives (From admission, onward)    None         Objective:   Vitals:   05/29/23 2007 05/29/23 2338 05/30/23 0357 05/30/23 0830  BP: (!) 119/107 115/89 131/87 (!) 134/90  Pulse: (!) 121 97 99 (!) 104  Resp:  16 16   Temp: 98.4 F (36.9 C) 98.2 F (36.8 C) (!) 97.5 F (36.4 C) 98.8 F (37.1 C)  TempSrc: Oral Oral Oral Oral  SpO2: 96% 100% 100% 100%  Weight:      Height:        Wt Readings from Last 3 Encounters:  05/29/23 81 kg  05/28/23 81 kg  05/15/23 81.4 kg     Intake/Output Summary (Last 24 hours) at 05/30/2023 0901 Last data filed at 05/30/2023 0359 Gross per 24 hour  Intake 3 ml  Output 500 ml  Net -497 ml     Physical Exam  General: Pleasant, obese middle-age woman laying in bed. No acute distress. HEENT: [place]/AT. Anicteric sclera CV: RRR. No murmurs, rubs, or gallops. No LE edema Pulmonary: Lungs CTAB. Normal effort. No wheezing or rales. Abdominal: Soft, NT/ND. Normal bowel sounds. Extremities: Palpable radial and DP pulses. Normal ROM. Skin: Warm and dry. No obvious rash or lesions. Neuro: A&Ox3. Residual left hemiparesis. Normal sensation to light touch.     RN pressure injury documentation:      Data Review:    Recent Labs  Lab 05/28/23 1246 05/29/23 1257 05/29/23 1303 05/29/23 1640  WBC 7.7 11.8*  --  10.0  HGB 12.4 12.2 13.6  12.9 11.6*  HCT 37.4 37.3 40.0  38.0 34.7*  PLT 433* 380  --  341  MCV 80.6 81.3  --  80.5  MCH 26.7 26.6  --  26.9  MCHC 33.2 32.7  --  33.4  RDW 15.9* 15.7*  --  15.7*  LYMPHSABS 2.7  --   --   --   MONOABS 0.6  --   --   --   EOSABS 0.2  --   --   --   BASOSABS 0.1  --   --   --     Recent Labs  Lab 05/28/23 1246 05/29/23 1257 05/29/23 1303  NA 134* 135 137  136  K 4.3 4.0 3.9  3.9  CL 98 101 103  CO2 26 19*  --   ANIONGAP 10 15  --   GLUCOSE 248* 265* 277*  BUN 27* 12 13  CREATININE 1.02* 0.79 0.80  AST 25 18  --   ALT 18  17  --   ALKPHOS 70 66  --   BILITOT 0.7 0.7  --   ALBUMIN  3.5 3.2*  --   CALCIUM  9.4 8.7*  --       Recent Labs  Lab 05/28/23 1246 05/29/23 1257  CALCIUM  9.4 8.7*    --------------------------------------------------------------------------------------------------------------- Lab Results  Component Value Date   CHOL 273 (H) 04/25/2023   HDL 55 04/25/2023   LDLCALC 187 (H) 04/25/2023   TRIG  165 (H) 04/25/2023   CHOLHDL 5.0 (H) 04/25/2023    Lab Results  Component Value Date   HGBA1C 10.0 (H) 04/25/2023   No results for input(s): "TSH", "T4TOTAL", "FREET4", "T3FREE", "THYROIDAB" in the last 72 hours. No results for input(s): "VITAMINB12", "FOLATE", "FERRITIN", "TIBC", "IRON ", "RETICCTPCT" in the last 72 hours. ------------------------------------------------------------------------------------------------------------------ Cardiac Enzymes No results for input(s): "CKMB", "TROPONINI", "MYOGLOBIN" in the last 168 hours.  Invalid input(s): "CK"  Micro Results No results found for this or any previous visit (from the past 240 hours).  Radiology Reports CT ABDOMEN PELVIS WO CONTRAST Result Date: 05/28/2023 CLINICAL DATA:  Abdominal pain, acute, nonlocalized EXAM: CT ABDOMEN AND PELVIS WITHOUT CONTRAST TECHNIQUE: Multidetector CT imaging of the abdomen and pelvis was performed following the standard protocol without IV contrast. RADIATION DOSE REDUCTION: This exam was performed according to the departmental dose-optimization program which includes automated exposure control, adjustment of the mA and/or kV according to patient size and/or use of iterative reconstruction technique. COMPARISON:  04/27/2023 FINDINGS: Lower chest: No pleural or pericardial effusion. Coronary and aortic calcifications, post CABG. Linear scarring or atelectasis in the right lower lobe. Hepatobiliary: No focal liver abnormality is seen. No gallstones, gallbladder wall thickening, or biliary dilatation.  Stable punctate metallic density posterior to the intrahepatic IVC. Pancreas: Scattered coarse pancreatic parenchymal calcifications predominantly in the head. No discrete mass or regional inflammatory changes. Spleen: Normal in size without focal abnormality. Adrenals/Urinary Tract: .left adrenal gland unremarkable. Metallic clips in the region of the right adrenal, which is not visualized. Symmetric renal contours without urolithiasis or hydronephrosis. Urinary bladder partially distended. Stomach/Bowel: Small hiatal hernia. Stomach incompletely distended. duodenal diverticulum. Small bowel decompressed. Normal appendix. The colon is partially distended, with a few scattered descending and sigmoid diverticula; no adjacent inflammatory changes. Vascular/Lymphatic: Moderate calcified aortoiliac plaque without AAA. No abdominal or pelvic adenopathy. Reproductive: Status post hysterectomy. No adnexal masses. Other: Stable 4 cm soft tissue density deep subcutaneous midline lesion in the anterior abdominal wall just above the symphysis pubis. No ascites. No free air. Musculoskeletal: Sternotomy wires. IMPRESSION: 1. No acute findings. 2. Colonic diverticulosis. 3.  Aortic Atherosclerosis (ICD10-I70.0). Electronically Signed   By: Nicoletta Barrier M.D.   On: 05/28/2023 15:23      Signature  -   Vita Grip M.D on 05/30/2023 at 9:01 AM   -  To page go to [email or web address]

## 2023-05-31 DIAGNOSIS — K3184 Gastroparesis: Secondary | ICD-10-CM | POA: Diagnosis not present

## 2023-05-31 DIAGNOSIS — R6889 Other general symptoms and signs: Secondary | ICD-10-CM | POA: Diagnosis not present

## 2023-05-31 DIAGNOSIS — I69320 Aphasia following cerebral infarction: Secondary | ICD-10-CM | POA: Diagnosis not present

## 2023-05-31 DIAGNOSIS — Z79899 Other long term (current) drug therapy: Secondary | ICD-10-CM | POA: Diagnosis not present

## 2023-05-31 DIAGNOSIS — R531 Weakness: Secondary | ICD-10-CM | POA: Diagnosis not present

## 2023-05-31 DIAGNOSIS — R112 Nausea with vomiting, unspecified: Secondary | ICD-10-CM | POA: Diagnosis not present

## 2023-05-31 DIAGNOSIS — Z7901 Long term (current) use of anticoagulants: Secondary | ICD-10-CM | POA: Diagnosis not present

## 2023-05-31 DIAGNOSIS — Z743 Need for continuous supervision: Secondary | ICD-10-CM | POA: Diagnosis not present

## 2023-05-31 DIAGNOSIS — I2510 Atherosclerotic heart disease of native coronary artery without angina pectoris: Secondary | ICD-10-CM | POA: Diagnosis not present

## 2023-05-31 DIAGNOSIS — D649 Anemia, unspecified: Secondary | ICD-10-CM | POA: Diagnosis not present

## 2023-05-31 DIAGNOSIS — E1143 Type 2 diabetes mellitus with diabetic autonomic (poly)neuropathy: Secondary | ICD-10-CM | POA: Diagnosis not present

## 2023-05-31 DIAGNOSIS — Z7401 Bed confinement status: Secondary | ICD-10-CM | POA: Diagnosis not present

## 2023-05-31 DIAGNOSIS — Z86718 Personal history of other venous thrombosis and embolism: Secondary | ICD-10-CM | POA: Diagnosis not present

## 2023-05-31 DIAGNOSIS — Z794 Long term (current) use of insulin: Secondary | ICD-10-CM | POA: Diagnosis not present

## 2023-05-31 DIAGNOSIS — I5032 Chronic diastolic (congestive) heart failure: Secondary | ICD-10-CM | POA: Diagnosis not present

## 2023-05-31 DIAGNOSIS — Z951 Presence of aortocoronary bypass graft: Secondary | ICD-10-CM | POA: Diagnosis not present

## 2023-05-31 DIAGNOSIS — E785 Hyperlipidemia, unspecified: Secondary | ICD-10-CM | POA: Diagnosis not present

## 2023-05-31 DIAGNOSIS — E039 Hypothyroidism, unspecified: Secondary | ICD-10-CM | POA: Diagnosis not present

## 2023-05-31 DIAGNOSIS — R110 Nausea: Secondary | ICD-10-CM | POA: Diagnosis not present

## 2023-05-31 DIAGNOSIS — K219 Gastro-esophageal reflux disease without esophagitis: Secondary | ICD-10-CM | POA: Diagnosis not present

## 2023-05-31 DIAGNOSIS — R109 Unspecified abdominal pain: Secondary | ICD-10-CM | POA: Diagnosis not present

## 2023-05-31 DIAGNOSIS — Z91041 Radiographic dye allergy status: Secondary | ICD-10-CM | POA: Diagnosis not present

## 2023-05-31 DIAGNOSIS — F32A Depression, unspecified: Secondary | ICD-10-CM | POA: Diagnosis present

## 2023-05-31 DIAGNOSIS — R1312 Dysphagia, oropharyngeal phase: Secondary | ICD-10-CM | POA: Diagnosis not present

## 2023-05-31 DIAGNOSIS — I69354 Hemiplegia and hemiparesis following cerebral infarction affecting left non-dominant side: Secondary | ICD-10-CM | POA: Diagnosis not present

## 2023-05-31 DIAGNOSIS — F419 Anxiety disorder, unspecified: Secondary | ICD-10-CM | POA: Diagnosis present

## 2023-05-31 DIAGNOSIS — R1031 Right lower quadrant pain: Secondary | ICD-10-CM | POA: Diagnosis not present

## 2023-05-31 DIAGNOSIS — K5900 Constipation, unspecified: Secondary | ICD-10-CM | POA: Diagnosis not present

## 2023-05-31 DIAGNOSIS — Z6832 Body mass index (BMI) 32.0-32.9, adult: Secondary | ICD-10-CM | POA: Diagnosis not present

## 2023-05-31 DIAGNOSIS — Z993 Dependence on wheelchair: Secondary | ICD-10-CM | POA: Diagnosis not present

## 2023-05-31 DIAGNOSIS — J45909 Unspecified asthma, uncomplicated: Secondary | ICD-10-CM | POA: Diagnosis not present

## 2023-05-31 DIAGNOSIS — N179 Acute kidney failure, unspecified: Secondary | ICD-10-CM | POA: Diagnosis not present

## 2023-05-31 DIAGNOSIS — E1165 Type 2 diabetes mellitus with hyperglycemia: Secondary | ICD-10-CM | POA: Diagnosis not present

## 2023-05-31 DIAGNOSIS — I110 Hypertensive heart disease with heart failure: Secondary | ICD-10-CM | POA: Diagnosis not present

## 2023-05-31 DIAGNOSIS — R1032 Left lower quadrant pain: Secondary | ICD-10-CM | POA: Diagnosis not present

## 2023-05-31 DIAGNOSIS — Z7902 Long term (current) use of antithrombotics/antiplatelets: Secondary | ICD-10-CM | POA: Diagnosis not present

## 2023-05-31 DIAGNOSIS — E0590 Thyrotoxicosis, unspecified without thyrotoxic crisis or storm: Secondary | ICD-10-CM | POA: Diagnosis not present

## 2023-05-31 DIAGNOSIS — E876 Hypokalemia: Secondary | ICD-10-CM | POA: Diagnosis not present

## 2023-05-31 DIAGNOSIS — I69391 Dysphagia following cerebral infarction: Secondary | ICD-10-CM | POA: Diagnosis not present

## 2023-05-31 DIAGNOSIS — G4733 Obstructive sleep apnea (adult) (pediatric): Secondary | ICD-10-CM | POA: Diagnosis not present

## 2023-05-31 LAB — GLUCOSE, CAPILLARY
Glucose-Capillary: 115 mg/dL — ABNORMAL HIGH (ref 70–99)
Glucose-Capillary: 125 mg/dL — ABNORMAL HIGH (ref 70–99)
Glucose-Capillary: 131 mg/dL — ABNORMAL HIGH (ref 70–99)
Glucose-Capillary: 167 mg/dL — ABNORMAL HIGH (ref 70–99)
Glucose-Capillary: 178 mg/dL — ABNORMAL HIGH (ref 70–99)
Glucose-Capillary: 97 mg/dL (ref 70–99)

## 2023-05-31 MED ORDER — ONDANSETRON HCL 4 MG/2ML IJ SOLN
4.0000 mg | Freq: Three times a day (TID) | INTRAMUSCULAR | Status: DC | PRN
Start: 2023-05-31 — End: 2023-06-02
  Filled 2023-05-31: qty 2

## 2023-05-31 MED ORDER — DIPHENHYDRAMINE HCL 50 MG/ML IJ SOLN
25.0000 mg | Freq: Three times a day (TID) | INTRAMUSCULAR | Status: DC | PRN
  Administered 2023-05-31 – 2023-06-01 (×2): 25 mg via INTRAVENOUS
  Filled 2023-05-31 (×2): qty 1

## 2023-05-31 MED ORDER — PANTOPRAZOLE SODIUM 40 MG PO TBEC
40.0000 mg | DELAYED_RELEASE_TABLET | Freq: Two times a day (BID) | ORAL | Status: DC
  Administered 2023-05-31 – 2023-06-02 (×4): 40 mg via ORAL
  Filled 2023-05-31 (×4): qty 1

## 2023-05-31 NOTE — Progress Notes (Signed)
 PROGRESS NOTE                                                                                                                                                                                                             Patient Demographics:    Lori [name], is a 53 y.o. female, DOB - 1970-07-26, WUJ:[id number]  Outpatient Primary MD for the patient is Ngetich, Elijio Guadel[place], NP    LOS - 0  Admit date - 05/29/2023    Chief Complaint  Patient presents with   Emesis   Nausea   Abdominal Pain       Brief Narrative (HPI from H&P)   Lori [name] is a 53 y.o. female with medical history significant of hypertension, hyperlipidemia, GERD, diabetes, gastroparesis, CAD status post CABG, hypothyroidism, carotid artery disease, CVA, dysphagia, hemiplegia, wheelchair-bound, anemia, diastolic CHF, anxiety, depression, migraines, obesity, OSA, asthma, DVT, pheochromocytoma presenting with persistent nausea vomiting and abdominal pain.   Patient has had issues with persistent nausea vomiting in the past as well as gastroparesis.  She has had 5 days of nausea vomiting abdominal pain.  Was seen in the [facility], ED yesterday had a reassuring workup and CT scan there and was discharged home after tolerating p.o.  After returning home she has had recurrence of her nausea vomiting overnight and now is having some coffee ground emesis reportedly.  Pain is now lower in her abdomen than it was previously.   Reports reflux like pain. Denies fevers, chills, shortness of breath.   ED Course: Vital signs in the ED notable for heart rate in the 90s-140s, blood pressure in the 110s-150 systolic, respiratory rate in the teens to 20s.  Lab workup included CMP with bicarb 19, glucose 265, calcium  8.7, albumin  3.2.  CBC with leukocytosis to 11.8 increased from 7.7 yesterday.  FOBT negative.  Lipase normal.  Urinalysis pending.  Type and  screen pending.  VBG with normal pH and pCO2 36.3.  CT on pelvis yesterday showed no acute abnormality.  Patient received Dilaudid , Zofran , 1 L IV fluids in the ED.   Subjective:    Lori [name] was evaluated at the bed side.  Evaluated laying comfortably in bed. Had an episode of nausea this morning with her clear liquid diet. Repeat EKG showed QTc improved to 457. Patient also reports some itching  at the side of her chest where the EKG lead was placed and requesting some Benadryl . I advised her to slowly advance her diet.   Assessment  & Plan :    Assessment and Plan: # Intractable nausea vomiting, abdominal pain # Gastroparesis - Patient presenting with recurrent nausea vomiting and reporting some coffee-ground emesis - Multiple hospitalizations for nausea and vomiting in the setting of gastroparesis - Required 1 dose of Zofran  last night, had 1 episode of nausea this morning - QTc improved, will start on as needed IV Zofran , check EKG intermittently - Advance diet as tolerated, advised to advance slowly - IV Dilaudid  as needed for pain  # QTc prolongation - QTc prolonged at 594 on admission, improved to 457 today - Start as needed IV Zofran , - Continue telemetry, check EKG intermittently  # Hypertension BP remains soft with SBP 100s to 120s. - Continue to hold olmesartan -hydrochlorothiazide  for now - Continue home metoprolol   # GERD -Discontinue IV Protonix  -Resume home Protonix  40 mg BID   # Hyperlipidemia - Continue home atorvastatin , Zetia    # History of CVA - Residual dysphagia, left hemiparesis, wheelchair-bound - Continue home atorvastatin , Zetia  -Hemoglobin remained stable provide no evidence of GI bleed. - Resume Plavix  and Xarelto  tomorrow - Dys 3 diet last admission, take pill whole in apple sauce.   # Diabetes - Continue to hold long-acting insulin  - CBGs in the 80s to 170s - Continue every 4 hours SSI with CBG monitoring   # CAD - Status post  CABG - Continue home atorvastatin , Zetia , metoprolol  - Resume Plavix  and Xarelto  today   # Hyperthyroidism - Continue methimazole    # Carotid artery disease - Continue atorvastatin , Zetia    # Normocytic anemia -Hemoglobin stable at 11.2 from 11.6 on admission. - Trend CBC   # Chronic diastolic CHF > Last echo was in September 2024 with EF 60-65%, normal diastolic parameters, normal RV function.   # Anxiety # Depression - Continue home Lexapro    # Migraines - Continue Topamax    # OSA - Not on CPAP per chart   # Asthma - Continue home as needed albuterol    # History of DVT - Resume home Xarelto  today   # History of pheochromocytoma - Status post right adrenalectomy  # Class I obesity: Estimated body mass index is 32.66 kg/m as calculated from the following:   Height as of this encounter: 5\' 2"  (1.575 m).   Weight as of this encounter: 81 kg.          Condition -stable  Family Communication  : No family at bedside  Code Status : Full  Consults  : None  PUD Prophylaxis : Protonix    Procedures  :     None      Disposition Plan  :    Status is: Inpatient The patient will require care spanning > 2 midnights and should be moved to inpatient because: Intractable nausea and vomiting, IV pain medications and IV antiemetics  DVT Prophylaxis  :    rivaroxaban  (XARELTO ) tablet 10 mg Start: 05/31/23 1000 SCDs Start: 05/29/23 1443 rivaroxaban  (XARELTO ) tablet 10 mg     Lab Results  Component Value Date   PLT 322 05/30/2023    Diet :  Diet Order             Diet clear liquid Room service appropriate? Yes; Fluid consistency: Thin  Diet effective now  Inpatient Medications  Scheduled Meds:  atorvastatin   80 mg Oral Daily   baclofen   10 mg Oral TID   clopidogrel   75 mg Oral Daily   escitalopram   20 mg Oral q morning   ezetimibe   10 mg Oral Daily   gabapentin   100 mg Oral TID   insulin  aspart  0-15 Units Subcutaneous  Q4H   methimazole   5 mg Oral q morning   metoprolol  tartrate  50 mg Oral BID   pantoprazole  (PROTONIX ) IV  80 mg Intravenous Q12H   ramelteon   8 mg Oral QHS   rivaroxaban   10 mg Oral Daily   sodium chloride  flush  3 mL Intravenous Q12H   tamsulosin   0.4 mg Oral QHS   Continuous Infusions: PRN Meds:.acetaminophen  **OR** acetaminophen , albuterol , calcium  carbonate, HYDROmorphone  (DILAUDID ) injection, polyethylene glycol  Antibiotics  :    Anti-infectives (From admission, onward)    None         Objective:   Vitals:   05/30/23 1939 05/30/23 2210 05/31/23 0415 05/31/23 0750  BP: 114/77 106/70 113/80 120/75  Pulse: 96 93 86   Resp: 16  17 17   Temp: 98.3 F (36.8 C)  98.1 F (36.7 C) 98.2 F (36.8 C)  TempSrc:   Oral Oral  SpO2: 97%  98% 99%  Weight:      Height:        Wt Readings from Last 3 Encounters:  05/29/23 81 kg  05/28/23 81 kg  05/15/23 81.4 kg     Intake/Output Summary (Last 24 hours) at 05/31/2023 0811 Last data filed at 05/31/2023 0400 Gross per 24 hour  Intake 3 ml  Output 850 ml  Net -847 ml     Physical Exam  General: Pleasant, obese middle-age woman laying in bed. No acute distress. HEENT: [place]/AT. Anicteric sclera CV: RRR. No murmurs, rubs, or gallops. No LE edema Pulmonary: Lungs CTAB. Normal effort. No wheezing or rales. Abdominal: Soft, NT/ND. Normal bowel sounds. Extremities: Palpable radial and DP pulses. Normal ROM. Skin: Warm and dry. No obvious rash or lesions. Neuro: A&Ox3. Residual left hemiparesis. Normal sensation to light touch.     RN pressure injury documentation:      Data Review:    Recent Labs  Lab 05/28/23 1246 05/29/23 1257 05/29/23 1303 05/29/23 1640 05/30/23 1140  WBC 7.7 11.8*  --  10.0 10.4  HGB 12.4 12.2 13.6  12.9 11.6* 11.2*  HCT 37.4 37.3 40.0  38.0 34.7* 33.8*  PLT 433* 380  --  341 322  MCV 80.6 81.3  --  80.5 81.3  MCH 26.7 26.6  --  26.9 26.9  MCHC 33.2 32.7  --  33.4 33.1  RDW 15.9* 15.7*   --  15.7* 15.8*  LYMPHSABS 2.7  --   --   --   --   MONOABS 0.6  --   --   --   --   EOSABS 0.2  --   --   --   --   BASOSABS 0.1  --   --   --   --     Recent Labs  Lab 05/28/23 1246 05/29/23 1257 05/29/23 1303 05/30/23 1140  NA 134* 135 137  136 135  K 4.3 4.0 3.9  3.9 4.0  CL 98 101 103 108  CO2 26 19*  --  18*  ANIONGAP 10 15  --  9  GLUCOSE 248* 265* 277* 205*  BUN 27* 12 13 7   CREATININE 1.02* 0.79 0.80 0.60  AST 25 18  --  17  ALT 18 17  --  16  ALKPHOS 70 66  --  56  BILITOT 0.7 0.7  --  0.8  ALBUMIN  3.5 3.2*  --  2.7*  CALCIUM  9.4 8.7*  --  8.0*      Recent Labs  Lab 05/28/23 1246 05/29/23 1257 05/30/23 1140  CALCIUM  9.4 8.7* 8.0*    --------------------------------------------------------------------------------------------------------------- Lab Results  Component Value Date   CHOL 273 (H) 04/25/2023   HDL 55 04/25/2023   LDLCALC 187 (H) 04/25/2023   TRIG 165 (H) 04/25/2023   CHOLHDL 5.0 (H) 04/25/2023    Lab Results  Component Value Date   HGBA1C 10.0 (H) 04/25/2023   No results for input(s): "TSH", "T4TOTAL", "FREET4", "T3FREE", "THYROIDAB" in the last 72 hours. No results for input(s): "VITAMINB12", "FOLATE", "FERRITIN", "TIBC", "IRON ", "RETICCTPCT" in the last 72 hours. ------------------------------------------------------------------------------------------------------------------ Cardiac Enzymes No results for input(s): "CKMB", "TROPONINI", "MYOGLOBIN" in the last 168 hours.  Invalid input(s): "CK"  Micro Results No results found for this or any previous visit (from the past 240 hours).  Radiology Reports CT ABDOMEN PELVIS WO CONTRAST Result Date: 05/28/2023 CLINICAL DATA:  Abdominal pain, acute, nonlocalized EXAM: CT ABDOMEN AND PELVIS WITHOUT CONTRAST TECHNIQUE: Multidetector CT imaging of the abdomen and pelvis was performed following the standard protocol without IV contrast. RADIATION DOSE REDUCTION: This exam was performed  according to the departmental dose-optimization program which includes automated exposure control, adjustment of the mA and/or kV according to patient size and/or use of iterative reconstruction technique. COMPARISON:  04/27/2023 FINDINGS: Lower chest: No pleural or pericardial effusion. Coronary and aortic calcifications, post CABG. Linear scarring or atelectasis in the right lower lobe. Hepatobiliary: No focal liver abnormality is seen. No gallstones, gallbladder wall thickening, or biliary dilatation. Stable punctate metallic density posterior to the intrahepatic IVC. Pancreas: Scattered coarse pancreatic parenchymal calcifications predominantly in the head. No discrete mass or regional inflammatory changes. Spleen: Normal in size without focal abnormality. Adrenals/Urinary Tract: .left adrenal gland unremarkable. Metallic clips in the region of the right adrenal, which is not visualized. Symmetric renal contours without urolithiasis or hydronephrosis. Urinary bladder partially distended. Stomach/Bowel: Small hiatal hernia. Stomach incompletely distended. duodenal diverticulum. Small bowel decompressed. Normal appendix. The colon is partially distended, with a few scattered descending and sigmoid diverticula; no adjacent inflammatory changes. Vascular/Lymphatic: Moderate calcified aortoiliac plaque without AAA. No abdominal or pelvic adenopathy. Reproductive: Status post hysterectomy. No adnexal masses. Other: Stable 4 cm soft tissue density deep subcutaneous midline lesion in the anterior abdominal wall just above the symphysis pubis. No ascites. No free air. Musculoskeletal: Sternotomy wires. IMPRESSION: 1. No acute findings. 2. Colonic diverticulosis. 3.  Aortic Atherosclerosis (ICD10-I70.0). Electronically Signed   By: Nicoletta Barrier M.D.   On: 05/28/2023 15:23      Signature  -   Vita Grip M.D on 05/31/2023 at 8:11 AM   -  To page go to [email or web address]

## 2023-06-01 DIAGNOSIS — R112 Nausea with vomiting, unspecified: Secondary | ICD-10-CM | POA: Diagnosis not present

## 2023-06-01 LAB — CBC
HCT: 35.3 % — ABNORMAL LOW (ref 36.0–46.0)
Hemoglobin: 11.3 g/dL — ABNORMAL LOW (ref 12.0–15.0)
MCH: 26.6 pg (ref 26.0–34.0)
MCHC: 32 g/dL (ref 30.0–36.0)
MCV: 83.1 fL (ref 80.0–100.0)
Platelets: 254 10*3/uL (ref 150–400)
RBC: 4.25 MIL/uL (ref 3.87–5.11)
RDW: 15.7 % — ABNORMAL HIGH (ref 11.5–15.5)
WBC: 5.7 10*3/uL (ref 4.0–10.5)
nRBC: 0 % (ref 0.0–0.2)

## 2023-06-01 LAB — BASIC METABOLIC PANEL WITH GFR
Anion gap: 11 (ref 5–15)
BUN: 6 mg/dL (ref 6–20)
CO2: 18 mmol/L — ABNORMAL LOW (ref 22–32)
Calcium: 8.1 mg/dL — ABNORMAL LOW (ref 8.9–10.3)
Chloride: 108 mmol/L (ref 98–111)
Creatinine, Ser: 0.72 mg/dL (ref 0.44–1.00)
GFR, Estimated: >60 mL/min (ref >60)
Glucose, Bld: 97 mg/dL (ref 70–99)
Potassium: 3.9 mmol/L (ref 3.5–5.1)
Sodium: 137 mmol/L (ref 135–145)

## 2023-06-01 LAB — GLUCOSE, CAPILLARY
Glucose-Capillary: 130 mg/dL — ABNORMAL HIGH (ref 70–99)
Glucose-Capillary: 136 mg/dL — ABNORMAL HIGH (ref 70–99)
Glucose-Capillary: 196 mg/dL — ABNORMAL HIGH (ref 70–99)
Glucose-Capillary: 217 mg/dL — ABNORMAL HIGH (ref 70–99)
Glucose-Capillary: 74 mg/dL (ref 70–99)
Glucose-Capillary: 89 mg/dL (ref 70–99)

## 2023-06-01 MED ORDER — HYDROMORPHONE HCL 1 MG/ML IJ SOLN
0.5000 mg | Freq: Four times a day (QID) | INTRAMUSCULAR | Status: DC | PRN
  Administered 2023-06-01 (×2): 0.5 mg via SUBCUTANEOUS
  Filled 2023-06-01 (×2): qty 0.5

## 2023-06-01 MED ORDER — INSULIN ASPART 100 UNIT/ML IJ SOLN
0.0000 [IU] | Freq: Three times a day (TID) | INTRAMUSCULAR | Status: DC
  Administered 2023-06-01 (×2): 1 [IU] via SUBCUTANEOUS
  Administered 2023-06-02: 3 [IU] via SUBCUTANEOUS

## 2023-06-01 MED ORDER — INSULIN ASPART 100 UNIT/ML IJ SOLN: 0.0000 [IU] | Freq: Every day | INTRAMUSCULAR | Status: DC

## 2023-06-01 MED ORDER — DIPHENHYDRAMINE HCL 25 MG PO CAPS
25.0000 mg | ORAL_CAPSULE | Freq: Three times a day (TID) | ORAL | Status: DC | PRN
  Administered 2023-06-01 – 2023-06-02 (×2): 25 mg via ORAL
  Filled 2023-06-01 (×2): qty 1

## 2023-06-01 NOTE — Plan of Care (Signed)

## 2023-06-01 NOTE — Progress Notes (Signed)
 PROGRESS NOTE    Lori [name]  WGN:[id number] DOB: December 08, 1970 DOA: 05/29/2023 PCP: Estil Heman, NP   Brief Narrative:  Lori [name] is a 53 y.o. female with medical history significant of hypertension, hyperlipidemia, GERD, diabetes, gastroparesis, CAD status post CABG, hypothyroidism, carotid artery disease, CVA, dysphagia, hemiplegia, wheelchair-bound, anemia, diastolic CHF, anxiety, depression, migraines, obesity, OSA, asthma, DVT, pheochromocytoma presenting with persistent nausea vomiting and abdominal pain.   Patient has had issues with persistent nausea vomiting in the past as well as gastroparesis.  She has had 5 days of nausea vomiting abdominal pain.  Was seen in the [facility], ED yesterday had a reassuring workup and CT scan there and was discharged home after tolerating p.o.  After returning home she has had recurrence of her nausea vomiting overnight and now is having some coffee ground emesis reportedly.  Pain is now lower in her abdomen than it was previously.   Reports reflux like pain. Denies fevers, chills, shortness of breath.   ED Course: Vital signs in the ED notable for heart rate in the 90s-140s, blood pressure in the 110s-150 systolic, respiratory rate in the teens to 20s.  Lab workup included CMP with bicarb 19, glucose 265, calcium  8.7, albumin  3.2.  CBC with leukocytosis to 11.8 increased from 7.7 yesterday.  FOBT negative.  Lipase normal.  Urinalysis pending.  Type and screen pending.  VBG with normal pH and pCO2 36.3.  CT on pelvis yesterday showed no acute abnormality.  Patient received Dilaudid , Zofran , 1 L IV fluids in the ED.  Assessment & Plan:   Principal Problem:   Intractable nausea and vomiting Active Problems:   Essential hypertension   Chronic diastolic CHF (congestive heart failure) (HCC)   OSA on CPAP   Diabetic gastroparesis (HCC)   CAD (coronary artery disease)   Hyperlipidemia   Spastic  hemiparesis of left 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 (HCC)   History of CVA with residual deficit   GERD (gastroesophageal reflux disease)   Wheelchair dependence   Morbid obesity (HCC)   History of DVT (deep vein thrombosis)   Carotid stenosis, bilateral   S/P CABG x 2   Anxiety and depression   Insulin  dependent type 2 diabetes mellitus (HCC)   Abdominal pain  # Intractable nausea vomiting, abdominal pain secondary to gastroparesis, plan: Feeling better.  Tolerating liquid diet.  She wants me to advance her to soft diet.  She still complains of abdominal pain though.  No nausea though.  Will advance to soft diet.   # QTc prolongation - QTc prolonged at 594 on admission, improved to 457, continue to monitor on telemetry.   # Hypertension BP remains soft with SBP 100s to 120s. - Continue to hold olmesartan -hydrochlorothiazide  for now - Continue home metoprolol    # GERD Continue p.o. Protonix .   # Hyperlipidemia - Continue home atorvastatin , Zetia    # History of CVA - Residual dysphagia, left hemiparesis, wheelchair-bound - Continue home atorvastatin , Zetia  -Hemoglobin remained stable provide no evidence of GI bleed.  Continue Plavix  and Xarelto .   # Diabetes melitis type II: - Continue to hold long-acting insulin , blood sugar below 74-89.  Continue SSI.   # CAD - Status post CABG - Continue home atorvastatin , Zetia , metoprolol  - Resume Plavix  and Xarelto  today   # Hyperthyroidism - Continue methimazole    # Carotid artery disease - Continue atorvastatin , Zetia    # Normocytic anemia Heme: Stable.  Trend CBC.   # Chronic diastolic CHF >  Last echo was in September 2024 with EF 60-65%, normal diastolic parameters, normal RV function.   # Anxiety # Depression - Continue home Lexapro    # Migraines - Continue Topamax    # OSA - Not on CPAP per chart   # Asthma - Continue home as needed albuterol    # History of DVT - Resume home Xarelto   today   # History of pheochromocytoma - Status post right adrenalectomy   # Class I obesity: Estimated body mass index is 32.66 kg/m as calculated from the following:   Height as of this encounter: 5\' 2"  (1.575 m).   Weight as of this encounter: 81 kg.     DVT prophylaxis: rivaroxaban  (XARELTO ) tablet 10 mg Start: 05/31/23 1000 SCDs Start: 05/29/23 1443   Code Status: Full Code  Family Communication:  None present at bedside.  Plan of care discussed with patient in length and he/she verbalized understanding and agreed with it.  Status is: Inpatient Remains inpatient appropriate because: Advancing to soft diet, needs observation overnight.  Potential discharge tomorrow.  Discussed with patient.   Estimated body mass index is 32.66 kg/m as calculated from the following:   Height as of this encounter: 5\' 2"  (1.575 m).   Weight as of this encounter: 81 kg.    Nutritional Assessment: Body mass index is 32.66 kg/m.Aaron Aas Seen by dietician.  I agree with the assessment and plan as outlined below: Nutrition Status:        . Skin Assessment: I have examined the patient's skin and I agree with the wound assessment as performed by the wound care RN as outlined below:    Consultants:  None  Procedures:  None  Antimicrobials:  Anti-infectives (From admission, onward)    None         Subjective: Patient seen and examined.  She still complains of abdominal pain but no other complaint.  She is tolerating clear liquid diet with no nausea.  Wants me to go to soft diet.  Discussed with the patient that if she tolerates today, we will probably discharge home tomorrow.  She is in agreement.  Objective: Vitals:   05/31/23 0750 05/31/23 1351 06/01/23 0448 06/01/23 0811  BP: 120/75 117/79 (!) 94/56 118/74  Pulse:  92 78 86  Resp: 17 17 18 18   Temp: 98.2 F (36.8 C) 98.5 F (36.9 C) 97.9 F (36.6 C) (!) 97.5 F (36.4 C)  TempSrc: Oral Oral Oral Oral  SpO2: 99% 98% 97% 99%   Weight:      Height:        Intake/Output Summary (Last 24 hours) at 06/01/2023 0941 Last data filed at 05/31/2023 1700 Gross per 24 hour  Intake 360 ml  Output 300 ml  Net 60 ml   Filed Weights   05/29/23 0922  Weight: 81 kg    Examination:  General exam: Appears calm and comfortable  Respiratory system: Clear to auscultation. Respiratory effort normal. Cardiovascular system: S1 & S2 heard, RRR. No JVD, murmurs, rubs, gallops or clicks. No pedal edema. Gastrointestinal system: Abdomen is nondistended, soft and nontender. No organomegaly or masses felt. Normal bowel sounds heard. Central nervous system: Alert and oriented.  Left hemiplegia. Extremities: Symmetric 5 x 5 power. Skin: No rashes, lesions or ulcers Psychiatry: Judgement and insight appear normal. Mood & affect appropriate.    Data Reviewed: I have personally reviewed following labs and imaging studies  CBC: Recent Labs  Lab 05/28/23 1246 05/29/23 1257 05/29/23 1303 05/29/23 1640 05/30/23 1140  WBC 7.7 11.8*  --  10.0 10.4  NEUTROABS 4.1  --   --   --   --   HGB 12.4 12.2 13.6  12.9 11.6* 11.2*  HCT 37.4 37.3 40.0  38.0 34.7* 33.8*  MCV 80.6 81.3  --  80.5 81.3  PLT 433* 380  --  341 322   Basic Metabolic Panel: Recent Labs  Lab 05/28/23 1246 05/29/23 1257 05/29/23 1303 05/30/23 1140  NA 134* 135 137  136 135  K 4.3 4.0 3.9  3.9 4.0  CL 98 101 103 108  CO2 26 19*  --  18*  GLUCOSE 248* 265* 277* 205*  BUN 27* 12 13 7   CREATININE 1.02* 0.79 0.80 0.60  CALCIUM  9.4 8.7*  --  8.0*   GFR: Estimated Creatinine Clearance: 83 mL/min (by C-G formula based on SCr of 0.6 mg/dL). Liver Function Tests: Recent Labs  Lab 05/28/23 1246 05/29/23 1257 05/30/23 1140  AST 25 18 17   ALT 18 17 16   ALKPHOS 70 66 56  BILITOT 0.7 0.7 0.8  PROT 7.7 6.8 5.8*  ALBUMIN  3.5 3.2* 2.7*   Recent Labs  Lab 05/28/23 1246 05/29/23 1257  LIPASE 23 22   No results for input(s): "AMMONIA" in the last 168  hours. Coagulation Profile: No results for input(s): "INR", "PROTIME" in the last 168 hours. Cardiac Enzymes: No results for input(s): "CKTOTAL", "CKMB", "CKMBINDEX", "TROPONINI" in the last 168 hours. BNP (last 3 results) No results for input(s): "PROBNP" in the last 8760 hours. HbA1C: No results for input(s): "HGBA1C" in the last 72 hours. CBG: Recent Labs  Lab 05/31/23 1557 05/31/23 2001 05/31/23 2351 06/01/23 0437 06/01/23 0810  GLUCAP 115* 167* 131* 74 89   Lipid Profile: No results for input(s): "CHOL", "HDL", "LDLCALC", "TRIG", "CHOLHDL", "LDLDIRECT" in the last 72 hours. Thyroid  Function Tests: No results for input(s): "TSH", "T4TOTAL", "FREET4", "T3FREE", "THYROIDAB" in the last 72 hours. Anemia Panel: No results for input(s): "VITAMINB12", "FOLATE", "FERRITIN", "TIBC", "IRON ", "RETICCTPCT" in the last 72 hours. Sepsis Labs: No results for input(s): "PROCALCITON", "LATICACIDVEN" in the last 168 hours.  No results found for this or any previous visit (from the past 240 hours).   Radiology Studies: No results found.  Scheduled Meds:  atorvastatin   80 mg Oral Daily   baclofen   10 mg Oral TID   clopidogrel   75 mg Oral Daily   escitalopram   20 mg Oral q morning   ezetimibe   10 mg Oral Daily   gabapentin   100 mg Oral TID   insulin  aspart  0-15 Units Subcutaneous Q4H   methimazole   5 mg Oral q morning   metoprolol  tartrate  50 mg Oral BID   pantoprazole   40 mg Oral BID   ramelteon   8 mg Oral QHS   rivaroxaban   10 mg Oral Daily   sodium chloride  flush  3 mL Intravenous Q12H   tamsulosin   0.4 mg Oral QHS   Continuous Infusions:   LOS: 1 day   Modena Andes, MD [facility]  06/01/2023, 9:41 AM   *Please note that this is a verbal dictation therefore any spelling or grammatical errors are due to the "Dragon Medical One" system interpretation.  Please page via Amion and do not message via secure chat for urgent patient care matters. Secure chat can be  used for non urgent patient care matters.  How to contact the [facility] Attending or Consulting provider 7A - 7P or covering provider during after hours 7P -7A, for this patient?  Check the care team in [facility] and look for a) attending/consulting [facility] provider listed and b) the [facility] team listed. Page or secure chat 7A-7P. Log into [email or web address] and use [place]'s universal password to access. If you do not have the password, please contact the hospital operator. Locate the [facility] provider you are looking for under [facility] and page to a number that you can be directly reached. If you still have difficulty reaching the provider, please page the [facility] (Director on Call) for the Hospitalists listed on amion for assistance.

## 2023-06-01 NOTE — Plan of Care (Signed)
 Patient remained stable today.  Advanced diet with no nausea or vomiting.

## 2023-06-01 NOTE — Progress Notes (Signed)
 PHARMACIST - PHYSICIAN COMMUNICATION  DR:   Lilyan Remedies  CONCERNING: IV to Oral Route Change Policy  RECOMMENDATION: This patient is receiving benadryl  by the intravenous route.  Based on criteria approved by the Pharmacy and Therapeutics Committee, the intravenous medication(s) is/are being converted to the equivalent oral dose form(s).   DESCRIPTION: These criteria include: The patient is eating (either orally or via tube) and/or has been taking other orally administered medications for a least 24 hours The patient has no evidence of active gastrointestinal bleeding or impaired GI absorption (gastrectomy, short bowel, patient on TNA or NPO).  If you have questions about this conversion, please contact the Pharmacy Department  []   [phone] )  Cristine Done []   [phone] )  [facility] [x]   [phone] )  [facility] Benes []   [phone] )  [facility] []   [phone] )  [facility]

## 2023-06-02 ENCOUNTER — Other Ambulatory Visit: Payer: Self-pay

## 2023-06-02 ENCOUNTER — Inpatient Hospital Stay (HOSPITAL_COMMUNITY)
Admission: EM | Admit: 2023-06-02 | Discharge: 2023-06-10 | DRG: 074 | Disposition: A | Discharge status: Home Health | Attending: Internal Medicine | Admitting: Internal Medicine

## 2023-06-02 ENCOUNTER — Telehealth: Payer: Self-pay

## 2023-06-02 ENCOUNTER — Encounter (HOSPITAL_COMMUNITY): Payer: Self-pay | Admitting: Emergency Medicine

## 2023-06-02 DIAGNOSIS — E1165 Type 2 diabetes mellitus with hyperglycemia: Secondary | ICD-10-CM | POA: Diagnosis not present

## 2023-06-02 DIAGNOSIS — R109 Unspecified abdominal pain: Principal | ICD-10-CM

## 2023-06-02 DIAGNOSIS — K3184 Gastroparesis: Secondary | ICD-10-CM | POA: Diagnosis present

## 2023-06-02 DIAGNOSIS — Z794 Long term (current) use of insulin: Secondary | ICD-10-CM | POA: Diagnosis not present

## 2023-06-02 DIAGNOSIS — I10 Essential (primary) hypertension: Secondary | ICD-10-CM | POA: Diagnosis present

## 2023-06-02 DIAGNOSIS — R1312 Dysphagia, oropharyngeal phase: Secondary | ICD-10-CM | POA: Diagnosis not present

## 2023-06-02 DIAGNOSIS — F419 Anxiety disorder, unspecified: Secondary | ICD-10-CM | POA: Diagnosis present

## 2023-06-02 DIAGNOSIS — E119 Type 2 diabetes mellitus without complications: Secondary | ICD-10-CM

## 2023-06-02 DIAGNOSIS — Z7901 Long term (current) use of anticoagulants: Secondary | ICD-10-CM

## 2023-06-02 DIAGNOSIS — Z79899 Other long term (current) drug therapy: Secondary | ICD-10-CM

## 2023-06-02 DIAGNOSIS — K219 Gastro-esophageal reflux disease without esophagitis: Secondary | ICD-10-CM | POA: Diagnosis present

## 2023-06-02 DIAGNOSIS — E1169 Type 2 diabetes mellitus with other specified complication: Secondary | ICD-10-CM

## 2023-06-02 DIAGNOSIS — E876 Hypokalemia: Secondary | ICD-10-CM | POA: Diagnosis present

## 2023-06-02 DIAGNOSIS — I2510 Atherosclerotic heart disease of native coronary artery without angina pectoris: Secondary | ICD-10-CM | POA: Diagnosis not present

## 2023-06-02 DIAGNOSIS — Z6832 Body mass index (BMI) 32.0-32.9, adult: Secondary | ICD-10-CM

## 2023-06-02 DIAGNOSIS — K5900 Constipation, unspecified: Secondary | ICD-10-CM | POA: Diagnosis not present

## 2023-06-02 DIAGNOSIS — F32A Depression, unspecified: Secondary | ICD-10-CM | POA: Diagnosis present

## 2023-06-02 DIAGNOSIS — R112 Nausea with vomiting, unspecified: Secondary | ICD-10-CM | POA: Diagnosis not present

## 2023-06-02 DIAGNOSIS — I110 Hypertensive heart disease with heart failure: Secondary | ICD-10-CM | POA: Diagnosis present

## 2023-06-02 DIAGNOSIS — Z951 Presence of aortocoronary bypass graft: Secondary | ICD-10-CM

## 2023-06-02 DIAGNOSIS — R1031 Right lower quadrant pain: Secondary | ICD-10-CM | POA: Diagnosis not present

## 2023-06-02 DIAGNOSIS — E0590 Thyrotoxicosis, unspecified without thyrotoxic crisis or storm: Secondary | ICD-10-CM | POA: Diagnosis present

## 2023-06-02 DIAGNOSIS — I69354 Hemiplegia and hemiparesis following cerebral infarction affecting left non-dominant side: Secondary | ICD-10-CM | POA: Diagnosis not present

## 2023-06-02 DIAGNOSIS — I5032 Chronic diastolic (congestive) heart failure: Secondary | ICD-10-CM | POA: Diagnosis not present

## 2023-06-02 DIAGNOSIS — E785 Hyperlipidemia, unspecified: Secondary | ICD-10-CM | POA: Diagnosis present

## 2023-06-02 DIAGNOSIS — Z86718 Personal history of other venous thrombosis and embolism: Secondary | ICD-10-CM | POA: Diagnosis not present

## 2023-06-02 DIAGNOSIS — J45909 Unspecified asthma, uncomplicated: Secondary | ICD-10-CM | POA: Diagnosis not present

## 2023-06-02 DIAGNOSIS — Z7902 Long term (current) use of antithrombotics/antiplatelets: Secondary | ICD-10-CM | POA: Diagnosis not present

## 2023-06-02 DIAGNOSIS — E669 Obesity, unspecified: Secondary | ICD-10-CM | POA: Diagnosis present

## 2023-06-02 DIAGNOSIS — G8114 Spastic hemiplegia affecting left nondominant side: Secondary | ICD-10-CM | POA: Diagnosis present

## 2023-06-02 DIAGNOSIS — E1143 Type 2 diabetes mellitus with diabetic autonomic (poly)neuropathy: Secondary | ICD-10-CM | POA: Diagnosis not present

## 2023-06-02 DIAGNOSIS — R1032 Left lower quadrant pain: Secondary | ICD-10-CM | POA: Diagnosis not present

## 2023-06-02 DIAGNOSIS — Z91041 Radiographic dye allergy status: Secondary | ICD-10-CM

## 2023-06-02 DIAGNOSIS — E039 Hypothyroidism, unspecified: Secondary | ICD-10-CM | POA: Diagnosis present

## 2023-06-02 LAB — CBC WITH DIFFERENTIAL/PLATELET
Abs Immature Granulocytes: 0.04 10*3/uL (ref 0.00–0.07)
Basophils Absolute: 0 10*3/uL (ref 0.0–0.1)
Basophils Relative: 0 %
Eosinophils Absolute: 0 10*3/uL (ref 0.0–0.5)
Eosinophils Relative: 0 %
HCT: 33.3 % — ABNORMAL LOW (ref 36.0–46.0)
Hemoglobin: 11.1 g/dL — ABNORMAL LOW (ref 12.0–15.0)
Immature Granulocytes: 0 %
Lymphocytes Relative: 5 %
Lymphs Abs: 0.6 10*3/uL — ABNORMAL LOW (ref 0.7–4.0)
MCH: 27 pg (ref 26.0–34.0)
MCHC: 33.3 g/dL (ref 30.0–36.0)
MCV: 81 fL (ref 80.0–100.0)
Monocytes Absolute: 0.2 10*3/uL (ref 0.1–1.0)
Monocytes Relative: 2 %
Neutro Abs: 10.5 10*3/uL — ABNORMAL HIGH (ref 1.7–7.7)
Neutrophils Relative %: 93 %
Platelets: 348 10*3/uL (ref 150–400)
RBC: 4.11 MIL/uL (ref 3.87–5.11)
RDW: 15.2 % (ref 11.5–15.5)
WBC: 11.3 10*3/uL — ABNORMAL HIGH (ref 4.0–10.5)
nRBC: 0 % (ref 0.0–0.2)

## 2023-06-02 LAB — GLUCOSE, CAPILLARY
Glucose-Capillary: 231 mg/dL — ABNORMAL HIGH (ref 70–99)
Glucose-Capillary: 148 mg/dL — ABNORMAL HIGH (ref 70–99)

## 2023-06-02 MED ORDER — FENTANYL CITRATE PF 50 MCG/ML IJ SOSY
25.0000 ug | PREFILLED_SYRINGE | Freq: Once | INTRAMUSCULAR | Status: AC
  Administered 2023-06-03: 25 ug via INTRAVENOUS
  Filled 2023-06-02: qty 1

## 2023-06-02 MED ORDER — ONDANSETRON HCL 4 MG/2ML IJ SOLN
4.0000 mg | Freq: Once | INTRAMUSCULAR | Status: AC
  Administered 2023-06-02: 4 mg via INTRAVENOUS
  Filled 2023-06-02: qty 2

## 2023-06-02 MED ORDER — SODIUM CHLORIDE 0.9% FLUSH: 10.0000 mL | INTRAVENOUS | Status: DC | PRN

## 2023-06-02 MED ORDER — SODIUM CHLORIDE 0.9% FLUSH
10.0000 mL | Freq: Two times a day (BID) | INTRAVENOUS | Status: DC
  Administered 2023-06-03 – 2023-06-05 (×3): 10 mL
  Administered 2023-06-06: 20 mL
  Administered 2023-06-07 – 2023-06-08 (×4): 10 mL
  Administered 2023-06-09: 20 mL

## 2023-06-02 MED ORDER — SODIUM CHLORIDE 0.9 % IV BOLUS
1000.0000 mL | Freq: Once | INTRAVENOUS | Status: AC
  Administered 2023-06-02: 1000 mL via INTRAVENOUS

## 2023-06-02 NOTE — ED Notes (Signed)
 Patient asking for pain medicine again, EDP made aware again.

## 2023-06-02 NOTE — ED Notes (Signed)
Patient moved to room 47 for PICC line placement

## 2023-06-02 NOTE — ED Notes (Signed)
 Per IV team RN, patient will be candidate for PICC line or EJ d/t poor venous access.  IV team RN made EDP aware.

## 2023-06-02 NOTE — Progress Notes (Signed)
 [facility] Liaison Note  06/02/2023  Lori [name] [birth year] [id number]  Location: RN Hospital Liaison screened the patient remotely at [facility].  Insurance: [employer]   Lori [name] is a 53 y.o. female who is a Primary Care Patient of Ngetich, Elijio Guadeloupe, NP [facility] and Adult.  The patient was screened for 30 day readmission hospitalization with noted extreme risk score for unplanned readmission risk with 8 IP/2 ED in 6 months.  The patient was assessed for potential Care Management service needs for post hospital transition for care coordination. Review of patient's electronic medical record reveals patient was admitted for Intractable nausea and vomiting. Pt discharged home today with self care. Due to risk and readmission liaison will make a referral for post hospital readmission prevention.  Plan: [facility] Liaison will continue to follow progress and disposition to asess for post hospital community care coordination/management needs.  Referral request for community care coordination: Liaison will make a referral for post hospital prevention readmission follow up call with care management services.    VBCI Care Management/Population Health does not replace or interfere with any arrangements made by the Inpatient Transition of Care team.   For questions contact:   Lilla Reichert, RN, [facility] Liaison [place]   [facility], Population Health Office Hours MTWF  8:00 am-6:00 pm Direct Dial : [phone] mobile [email or web address]

## 2023-06-02 NOTE — Plan of Care (Signed)

## 2023-06-02 NOTE — TOC Transition Note (Signed)
 Transition of Care [facility]) - Discharge Note   Patient Details  Name: Lori [name] MRN: [id number] Date of Birth: 01/04/1971  Transition of Care [facility]) CM/SW Contact:  Alisa App, RN Phone Number: 06/02/2023, 11:09 AM   Clinical Narrative:    Patient will DC to: home Anticipated DC date: 06/02/2023 Family notified: yes Transport by: car  Per MD patient ready for DC today. RN, patient, patient's husband, and [facility] notified of DC. Transportation forms on front of chart. Ambulance transport requested for patient.   RNCM will sign off for now as intervention is no longer needed. Please consult us  again if new needs arise.   Final next level of care: Home w Home Health Services Barriers to Discharge: No Barriers Identified   Patient Goals and CMS Choice            Discharge Placement                       Discharge Plan and Services Additional resources added to the After Visit Summary for                                       Social Drivers of Health (SDOH) Interventions SDOH Screenings   Food Insecurity: No Food Insecurity (05/30/2023)  Housing: Low Risk  (05/30/2023)  Transportation Needs: No Transportation Needs (05/30/2023)  Utilities: Not At Risk (05/30/2023)  Depression (PHQ2-9): Low Risk  (05/15/2023)  Tobacco Use: Low Risk  (05/29/2023)     Readmission Risk Interventions    05/13/2023    3:06 PM 05/01/2023    3:01 PM 12/28/2022   11:30 AM  Readmission Risk Prevention Plan  Transportation Screening Complete Complete Complete  Medication Review Oceanographer) Complete Complete Complete  PCP or Specialist appointment within 3-5 days of discharge Complete Complete Complete  HRI or Home Care Consult Complete Complete Complete  SW Recovery Care/Counseling Consult Complete Complete Complete  Palliative Care Screening Not Applicable Not Applicable Not Applicable  Skilled Nursing Facility Patient Refused Patient  Refused Not Applicable

## 2023-06-02 NOTE — Progress Notes (Signed)
 Peripherally Inserted Central Catheter Placement  The IV Nurse has discussed with the patient and/or persons authorized to consent for the patient, the purpose of this procedure and the potential benefits and risks involved with this procedure.  The benefits include less needle sticks, lab draws from the catheter, and the patient may be discharged home with the catheter. Risks include, but not limited to, infection, bleeding, blood clot (thrombus formation), and puncture of an artery; nerve damage and irregular heartbeat and possibility to perform a PICC exchange if needed/ordered by physician.  Alternatives to this procedure were also discussed.  Bard Power PICC patient education guide, fact sheet on infection prevention and patient information card has been provided to patient /or left at bedside.  Verbal consent given due to patient preference, witnessed by Theophilus Fitz, RN  PICC Placement Documentation  PICC Double Lumen 06/02/23 Right Brachial 35 cm 0 cm (Active)  Indication for Insertion or Continuance of Line Poor Vasculature-patient has had multiple peripheral attempts or PIVs lasting less than 24 hours 06/02/23 2315  Exposed Catheter (cm) 0 cm 06/02/23 2315  Site Assessment Clean, Dry, Intact 06/02/23 2315  Lumen #1 Status Flushed;Saline locked;Blood return noted 06/02/23 2315  Lumen #2 Status Flushed;Saline locked;Blood return noted 06/02/23 2315  Dressing Type Transparent;Securing device 06/02/23 2315  Dressing Status Antimicrobial disc/dressing in place;Clean, Dry, Intact 06/02/23 2315  Line Care Connections checked and tightened 06/02/23 2315  Line Adjustment (NICU/IV Team Only) No 06/02/23 2315  Dressing Intervention New dressing;Adhesive placed at insertion site (IV team only) 06/02/23 2315  Dressing Change Due 06/09/23 06/02/23 2315       Bela Nyborg, Miles Allan 06/02/2023, 11:16 PM

## 2023-06-02 NOTE — ED Notes (Signed)
 Patient's husband updated via phone with patient's permission.

## 2023-06-02 NOTE — ED Notes (Signed)
 EDP made aware patient is requesting pain medication.

## 2023-06-02 NOTE — Discharge Summary (Signed)
 Physician Discharge Summary  Lori [name] [name] ZOX:[id number] DOB: [birth year] DOA: 05/29/2023  PCP: Estil Heman, NP  Admit date: 05/29/2023 Discharge date: 06/02/2023 30 Day Unplanned Readmission Risk Score    Flowsheet Row ED to Hosp-Admission (Current) from 05/29/2023 in [facility]  30 Day Unplanned Readmission Risk Score (%) 67.25 Filed at 06/02/2023 0801       This score is the patient's risk of an unplanned readmission within 30 days of being discharged (0 -100%). The score is based on dignosis, age, lab data, medications, orders, and past utilization.   Low:  0-14.9   Medium: 15-21.9   High: 22-29.9   Extreme: 30 and above          Admitted From: Home Disposition: Home  Recommendations for Outpatient Follow-up:  Follow up with PCP in 1-2 weeks Please obtain BMP/CBC in one week Please follow up with your PCP on the following pending results: Unresulted Labs (From admission, onward)    None         Home Health: None Equipment/Devices: None  Discharge Condition: Stable CODE STATUS: Full code Diet recommendation: Diabetic  Lori [name] is a 53 y.o. female with medical history significant of hypertension, hyperlipidemia, GERD, diabetes, gastroparesis, CAD status post CABG, hypothyroidism, carotid artery disease, CVA, dysphagia, hemiplegia, wheelchair-bound, anemia, diastolic CHF, anxiety, depression, migraines, obesity, OSA, asthma, DVT, pheochromocytoma presenting with persistent nausea vomiting and abdominal pain.   Patient has had issues with persistent nausea vomiting in the past as well as gastroparesis.  She has had 5 days of nausea vomiting abdominal pain.  Was seen in the [facility], ED yesterday had a reassuring workup and CT scan there and was discharged home after tolerating p.o.  After returning home she has had recurrence of her nausea vomiting overnight and now is having some  coffee ground emesis reportedly.  Pain is now lower in her abdomen than it was previously.   Reports reflux like pain. Denies fevers, chills, shortness of breath.   ED Course: Vital signs in the ED notable for heart rate in the 90s-140s, blood pressure in the 110s-150 systolic, respiratory rate in the teens to 20s.  Lab workup included CMP with bicarb 19, glucose 265, calcium  8.7, albumin  3.2.  CBC with leukocytosis to 11.8 increased from 7.7 yesterday.  FOBT negative.  Lipase normal.  Urinalysis pending.  Type and screen pending.  VBG with normal pH and pCO2 36.3.  CT on pelvis yesterday showed no acute abnormality.  Patient received Dilaudid , Zofran , 1 L IV fluids in the ED.  Subjective: Seen and examined, she is feeling well, no nausea vomiting, tolerated soft diet.  She feels comfortable going home today.  She requested that I call her husband about her discharge plan.  I called but there was no answer and voicemail box was not set up to receive voicemails as well.  Brief/Interim Summary: Patient was admitted for the following.  # Intractable nausea vomiting, abdominal pain secondary to gastroparesis, plan: Treated with as needed nausea medications.  Now feeling better.  Tolerating soft diet diet.  She feels back to baseline.  She is ready for discharge today.  She is stable.  Resume Reglan .   # QTc prolongation - QTc prolonged at 594 on admission, improved to 457,    # Hypertension BP remains soft with SBP 100s to 120s.  Some of her blood pressure medications were held but blood pressure improved so we are resuming all the  home medications now.   # GERD Continue p.o. Protonix .   # Hyperlipidemia - Continue home atorvastatin , Zetia    # History of CVA - Residual dysphagia, left hemiparesis, wheelchair-bound - Continue home atorvastatin , Zetia  -Hemoglobin remained stable provide no evidence of GI bleed.  Continue Plavix  and Xarelto .   # Diabetes melitis type II: Resume home  medications.   # CAD - Status post CABG - Continue home atorvastatin , Zetia , metoprolol  - Resume Plavix  and Xarelto  today   # Hyperthyroidism - Continue methimazole    # Carotid artery disease - Continue atorvastatin , Zetia    # Normocytic anemia Heme: Stable.  Trend CBC.   # Chronic diastolic CHF > Last echo was in September 2024 with EF 60-65%, normal diastolic parameters, normal RV function.   # Anxiety # Depression - Continue home Lexapro    # Migraines - Continue Topamax    # OSA - Not on CPAP per chart   # Asthma - Continue home as needed albuterol    # History of DVT - Resume home Xarelto  today   # History of pheochromocytoma - Status post right adrenalectomy   # Class I obesity: Estimated body mass index is 32.66 kg/m as calculated from the following:   Height as of this encounter: 5\' 2"  (1.575 m).   Weight as of this encounter: 81 kg.  Weight loss and diet modification counseled.  Discharge Diagnoses:  Principal Problem:   Intractable nausea and vomiting Active Problems:   Essential hypertension   Chronic diastolic CHF (congestive heart failure) (HCC)   OSA on CPAP   Diabetic gastroparesis (HCC)   CAD (coronary artery disease)   Hyperlipidemia   Spastic hemiparesis of left 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 (HCC)   History of CVA with residual deficit   GERD (gastroesophageal reflux disease)   Wheelchair dependence   Morbid obesity (HCC)   History of DVT (deep vein thrombosis)   Carotid stenosis, bilateral   S/P CABG x 2   Anxiety and depression   Insulin  dependent type 2 diabetes mellitus (HCC)   Abdominal pain    Discharge Instructions   Allergies as of 06/02/2023       Reactions   Asa [aspirin ] Anaphylaxis, Swelling, Other (See Comments)   Throat closes   Contrast Media [iodinated Contrast Media] Anaphylaxis, Swelling   Imitrex [sumatriptan] Anaphylaxis   Ms Contin  [morphine ] Anaphylaxis, Swelling   Penicillins  Swelling, Other (See Comments)   Mouth and eyes swell   Chlorhexidine  Gluconate [chlorhexidine ] Itching, Rash   Firvanq  [vancomycin ] Other (See Comments)   Red Man's Syndrome   Cipro  [ciprofloxacin  Hcl] Nausea And Vomiting   Nitrofuran Derivatives Anxiety   Wound Dressing Adhesive Itching, Rash        Medication List     STOP taking these medications    potassium chloride  SA 20 MEQ tablet Commonly known as: KLOR-CON  M   sucralfate  1 g tablet Commonly known as: Carafate        TAKE these medications    acetaminophen  325 MG tablet Commonly known as: TYLENOL  Take 2 tablets (650 mg total) by mouth every 6 (six) hours as needed for mild pain (pain score 1-3) (or Fever >/= 101).   albuterol  108 (90 Base) MCG/ACT inhaler Commonly known as: VENTOLIN  HFA INHALE 2 PUFFS BY MOUTH EVERY 6 HOURS AS NEEDED FOR WHEEZING AND FOR SHORTNESS OF BREATH Strength: 108 (90 Base) MCG/ACT   albuterol  (2.5 MG/3ML) 0.083% nebulizer solution Commonly known as: PROVENTIL  Take 3 mLs (2.5 mg total) by nebulization every  6 (six) hours as needed for wheezing or shortness of breath.   atorvastatin  80 MG tablet Commonly known as: LIPITOR  TAKE 1 TABLET (80 MG TOTAL) BY MOUTH DAILY. What changed: when to take this   baclofen  10 MG tablet Commonly known as: LIORESAL  Take 1 tablet (10 mg total) by mouth 3 (three) times daily.   clopidogrel  75 MG tablet Commonly known as: PLAVIX  TAKE 1 TABLET BY MOUTH DAILY   cyclobenzaprine  5 MG tablet Commonly known as: FLEXERIL  Take 1 tablet (5 mg total) by mouth 3 (three) times daily as needed for muscle spasms.   diclofenac  Sodium 1 % Gel Commonly known as: Voltaren  Apply 1 Application topically 4 (four) times daily as needed (pain).   escitalopram  20 MG tablet Commonly known as: LEXAPRO  Take 1 tablet (20 mg total) by mouth every morning.   ezetimibe  10 MG tablet Commonly known as: ZETIA  TAKE 1 TABLET BY MOUTH DAILY   ferrous sulfate  325 (65 FE) MG  tablet Take 1 tablet (325 mg total) by mouth daily with breakfast.   FreeStyle Libre 14 Day Sensor Misc 1 each by Does not apply route as needed. DX: E11.69   FreeStyle Libre 2 Reader Devi 1 Device by Does not apply route daily. E11.69   gabapentin  100 MG capsule Commonly known as: NEURONTIN  Take 100 mg by mouth 3 (three) times daily.   HumaLOG  KwikPen 100 UNIT/ML KwikPen Generic drug: insulin  lispro INJECT 4 UNITS SUBCUTANEOUSLY THREE TIMES A DAY WITH MEALS What changed: See the new instructions.   insulin  lispro 100 UNIT/ML injection Commonly known as: HUMALOG  Sliding scale AC, HS CBG 121 - 150: 2 units CBG 151 - 200: 3 units CBG 201 - 250: 5 units CBG 251 - 300: 8 units CBG 301 - 350: 11 units CBG 351 - 400: 15 units CBG > 400: call MD What changed: Another medication with the same name was changed. Make sure you understand how and when to take each.   Lantus  SoloStar 100 UNIT/ML Solostar Pen Generic drug: insulin  glargine Inject 10 Units into the skin at bedtime.   methimazole  5 MG tablet Commonly known as: TAPAZOLE  TAKE 1 TABLET BY MOUTH EVERY  MORNING   metoCLOPramide  10 MG tablet Commonly known as: REGLAN  Take 1 tablet (10 mg total) by mouth every 8 (eight) hours as needed for refractory nausea / vomiting.   metoprolol  tartrate 50 MG tablet Commonly known as: LOPRESSOR  Take 50 mg by mouth 2 (two) times daily.   olmesartan -hydrochlorothiazide  40-25 MG tablet Commonly known as: BENICAR  HCT Take 1 tablet by mouth daily.   ondansetron  4 MG disintegrating tablet Commonly known as: ZOFRAN -ODT Take 1 tablet (4 mg total) by mouth every 8 (eight) hours as needed for nausea.   pantoprazole  40 MG tablet Commonly known as: PROTONIX  Take 1 tablet (40 mg total) by mouth 2 (two) times daily.   ranolazine  500 MG 12 hr tablet Commonly known as: RANEXA  Take 1 tablet (500 mg total) by mouth 2 (two) times daily.   senna-docusate 8.6-50 MG tablet Commonly known as:  Senokot-S Take 1 tablet by mouth 2 (two) times daily.   tamsulosin  0.4 MG Caps capsule Commonly known as: FLOMAX  Take 1 capsule (0.4 mg total) by mouth at bedtime.   topiramate  25 MG tablet Commonly known as: TOPAMAX  Take 1 tablet (25 mg total) by mouth 2 (two) times daily. What changed:  how much to take when to take this   Xarelto  10 MG Tabs tablet Generic drug: rivaroxaban  TAKE 1  TABLET BY MOUTH DAILY        Follow-up Information     Ngetich, Dinah C, NP Follow up.   Specialty: Family Medicine Contact information: [address] [place] [address] [phone]         Ngetich, Dinah C, NP Follow up in 1 week(s).   Specialty: Family Medicine Contact information: [address] [place] [address] [phone]                Allergies  Allergen Reactions   Dayne Even ] Anaphylaxis, Swelling and Other (See Comments)    Throat closes   Contrast Media [Iodinated Contrast Media] Anaphylaxis and Swelling   Imitrex [Sumatriptan] Anaphylaxis   Ms Contin  [Morphine ] Anaphylaxis and Swelling   Penicillins Swelling and Other (See Comments)    Mouth and eyes swell   Chlorhexidine  Gluconate [Chlorhexidine ] Itching and Rash   Firvanq  [Vancomycin ] Other (See Comments)    Red Man's Syndrome   Cipro  [Ciprofloxacin  Hcl] Nausea And Vomiting   Nitrofuran Derivatives Anxiety   Wound Dressing Adhesive Itching and Rash    Consultations: None   Procedures/Studies: CT ABDOMEN PELVIS WO CONTRAST Result Date: 05/28/2023 CLINICAL DATA:  Abdominal pain, acute, nonlocalized EXAM: CT ABDOMEN AND PELVIS WITHOUT CONTRAST TECHNIQUE: Multidetector CT imaging of the abdomen and pelvis was performed following the standard protocol without IV contrast. RADIATION DOSE REDUCTION: This exam was performed according to the departmental dose-optimization program which includes automated exposure control, adjustment of the mA and/or kV according to patient size and/or use of iterative  reconstruction technique. COMPARISON:  04/27/2023 FINDINGS: Lower chest: No pleural or pericardial effusion. Coronary and aortic calcifications, post CABG. Linear scarring or atelectasis in the right lower lobe. Hepatobiliary: No focal liver abnormality is seen. No gallstones, gallbladder wall thickening, or biliary dilatation. Stable punctate metallic density posterior to the intrahepatic IVC. Pancreas: Scattered coarse pancreatic parenchymal calcifications predominantly in the head. No discrete mass or regional inflammatory changes. Spleen: Normal in size without focal abnormality. Adrenals/Urinary Tract: .left adrenal gland unremarkable. Metallic clips in the region of the right adrenal, which is not visualized. Symmetric renal contours without urolithiasis or hydronephrosis. Urinary bladder partially distended. Stomach/Bowel: Small hiatal hernia. Stomach incompletely distended. duodenal diverticulum. Small bowel decompressed. Normal appendix. The colon is partially distended, with a few scattered descending and sigmoid diverticula; no adjacent inflammatory changes. Vascular/Lymphatic: Moderate calcified aortoiliac plaque without AAA. No abdominal or pelvic adenopathy. Reproductive: Status post hysterectomy. No adnexal masses. Other: Stable 4 cm soft tissue density deep subcutaneous midline lesion in the anterior abdominal wall just above the symphysis pubis. No ascites. No free air. Musculoskeletal: Sternotomy wires. IMPRESSION: 1. No acute findings. 2. Colonic diverticulosis. 3.  Aortic Atherosclerosis (ICD10-I70.0). Electronically Signed   By: Nicoletta Barrier M.D.   On: 05/28/2023 15:23   DG CHEST PORT 1 VIEW Result Date: 05/10/2023 CLINICAL DATA:  Status post PICC placement. EXAM: PORTABLE CHEST 1 VIEW COMPARISON:  Earlier radiograph dated 05/10/2023. FINDINGS: Right-sided PICC with tip close to the cavoatrial junction. No focal consolidation, pleural effusion or pneumothorax. The cardiac silhouette is within  limits. Median sternotomy wires and CABG vascular clips. No acute osseous pathology. IMPRESSION: Right-sided PICC with tip close to the cavoatrial junction. Electronically Signed   By: Angus Bark M.D.   On: 05/10/2023 20:09   US  EKG Site Rite Result Date: 05/10/2023 If Site Rite image not attached, placement could not be confirmed due to current cardiac rhythm.  DG CHEST PORT 1 VIEW Result Date: 05/10/2023 CLINICAL DATA:  PICC line placement EXAM: PORTABLE CHEST 1 VIEW COMPARISON:  04/27/2023 FINDINGS: Sternotomy. No acute airspace disease, pleural effusion, or pneumothorax. A right azygous lobe/fissure is noted. Right upper extremity central venous catheter tip with abnormal orientation, curved and directed superiorly in the region of the right azygos fissure. IMPRESSION: Right upper extremity central venous catheter tip with abnormal orientation, curved and directed superiorly in the region of the right azygos fissure. Recommend repositioning. These results will be called to the ordering clinician or representative by the Radiologist Assistant, and communication documented in the PACS or [employer]. Electronically Signed   By: Esmeralda Hedge M.D.   On: 05/10/2023 18:02   US  EKG SITE RITE Result Date: 05/10/2023 If Site Rite image not attached, placement could not be confirmed due to current cardiac rhythm.    Discharge Exam: Vitals:   06/02/23 0435 06/02/23 0835  BP: 119/68 138/83  Pulse: 77 84  Resp: 16 18  Temp: 98.7 F (37.1 C) 97.9 F (36.6 C)  SpO2: 100% 98%   Vitals:   06/01/23 0811 06/01/23 1440 06/02/23 0435 06/02/23 0835  BP: 118/74 100/64 119/68 138/83  Pulse: 86 88 77 84  Resp: 18 18 16 18   Temp: (!) 97.5 F (36.4 C) 98.4 F (36.9 C) 98.7 F (37.1 C) 97.9 F (36.6 C)  TempSrc: Oral Oral    SpO2: 99% (!) 81% 100% 98%  Weight:      Height:        General: Pt is alert, awake, not in acute distress Cardiovascular: RRR, S1/S2 +, no rubs, no  gallops Respiratory: CTA bilaterally, no wheezing, no rhonchi Abdominal: Soft, NT, ND, bowel sounds + Extremities: no edema, no cyanosis    The results of significant diagnostics from this hospitalization (including imaging, microbiology, ancillary and laboratory) are listed below for reference.     Microbiology: No results found for this or any previous visit (from the past 240 hours).   Labs: BNP (last 3 results) Recent Labs    09/06/22 2242  BNP 19.5   Basic Metabolic Panel: Recent Labs  Lab 05/28/23 1246 05/29/23 1257 05/29/23 1303 05/30/23 1140 06/01/23 0949  NA 134* 135 137  136 135 137  K 4.3 4.0 3.9  3.9 4.0 3.9  CL 98 101 103 108 108  CO2 26 19*  --  18* 18*  GLUCOSE 248* 265* 277* 205* 97  BUN 27* 12 13 7 6   CREATININE 1.02* 0.79 0.80 0.60 0.72  CALCIUM  9.4 8.7*  --  8.0* 8.1*   Liver Function Tests: Recent Labs  Lab 05/28/23 1246 05/29/23 1257 05/30/23 1140  AST 25 18 17   ALT 18 17 16   ALKPHOS 70 66 56  BILITOT 0.7 0.7 0.8  PROT 7.7 6.8 5.8*  ALBUMIN  3.5 3.2* 2.7*   Recent Labs  Lab 05/28/23 1246 05/29/23 1257  LIPASE 23 22   No results for input(s): "AMMONIA" in the last 168 hours. CBC: Recent Labs  Lab 05/28/23 1246 05/29/23 1257 05/29/23 1303 05/29/23 1640 05/30/23 1140 06/01/23 0949  WBC 7.7 11.8*  --  10.0 10.4 5.7  NEUTROABS 4.1  --   --   --   --   --   HGB 12.4 12.2 13.6  12.9 11.6* 11.2* 11.3*  HCT 37.4 37.3 40.0  38.0 34.7* 33.8* 35.3*  MCV 80.6 81.3  --  80.5 81.3 83.1  PLT 433* 380  --  341 322 254   Cardiac Enzymes: No results for input(s): "CKTOTAL", "CKMB", "CKMBINDEX", "TROPONINI" in  the last 168 hours. BNP: Invalid input(s): "POCBNP" CBG: Recent Labs  Lab 06/01/23 1148 06/01/23 1629 06/01/23 2005 06/01/23 2345 06/02/23 0608  GLUCAP 130* 136* 196* 217* 231*   D-Dimer No results for input(s): "DDIMER" in the last 72 hours. Hgb A1c No results for input(s): "HGBA1C" in the last 72 hours. Lipid  Profile No results for input(s): "CHOL", "HDL", "LDLCALC", "TRIG", "CHOLHDL", "LDLDIRECT" in the last 72 hours. Thyroid  function studies No results for input(s): "TSH", "T4TOTAL", "T3FREE", "THYROIDAB" in the last 72 hours.  Invalid input(s): "FREET3" Anemia work up No results for input(s): "VITAMINB12", "FOLATE", "FERRITIN", "TIBC", "IRON ", "RETICCTPCT" in the last 72 hours. Urinalysis    Component Value Date/Time   COLORURINE YELLOW 05/28/2023 1613   APPEARANCEUR HAZY (A) 05/28/2023 1613   LABSPEC 1.011 05/28/2023 1613   PHURINE 5.0 05/28/2023 1613   GLUCOSEU NEGATIVE 05/28/2023 1613   HGBUR NEGATIVE 05/28/2023 1613   BILIRUBINUR NEGATIVE 05/28/2023 1613   KETONESUR NEGATIVE 05/28/2023 1613   PROTEINUR NEGATIVE 05/28/2023 1613   NITRITE NEGATIVE 05/28/2023 1613   LEUKOCYTESUR NEGATIVE 05/28/2023 1613   Sepsis Labs Recent Labs  Lab 05/29/23 1257 05/29/23 1640 05/30/23 1140 06/01/23 0949  WBC 11.8* 10.0 10.4 5.7   Microbiology No results found for this or any previous visit (from the past 240 hours).  FURTHER DISCHARGE INSTRUCTIONS:   Get Medicines reviewed and adjusted: Please take all your medications with you for your next visit with your Primary MD   Laboratory/radiological data: Please request your Primary MD to go over all hospital tests and procedure/radiological results at the follow up, please ask your Primary MD to get all Hospital records sent to his/her office.   In some cases, they will be blood work, cultures and biopsy results pending at the time of your discharge. Please request that your primary care M.D. goes through all the records of your hospital data and follows up on these results.   Also Note the following: If you experience worsening of your admission symptoms, develop shortness of breath, life threatening emergency, suicidal or homicidal thoughts you must seek medical attention immediately by calling 911 or calling your MD immediately  if  symptoms less severe.   You must read complete instructions/literature along with all the possible adverse reactions/side effects for all the Medicines you take and that have been prescribed to you. Take any new Medicines after you have completely understood and accpet all the possible adverse reactions/side effects.    Do not drive when taking Pain medications or sleeping medications (Benzodaizepines)   Do not take more than prescribed Pain, Sleep and Anxiety Medications. It is not advisable to combine anxiety,sleep and pain medications without talking with your primary care practitioner   Special Instructions: If you have smoked or chewed Tobacco  in the last 2 yrs please stop smoking, stop any regular Alcohol  and or any Recreational drug use.   Wear Seat belts while driving.   Please note: You were cared for by a hospitalist during your hospital stay. Once you are discharged, your primary care physician will handle any further medical issues. Please note that NO REFILLS for any discharge medications will be authorized once you are discharged, as it is imperative that you return to your primary care physician (or establish a relationship with a primary care physician if you do not have one) for your post hospital discharge needs so that they can reassess your need for medications and monitor your lab values  Time coordinating discharge: Over 30 minutes  SIGNED:   Modena Andes, MD  [facility] 06/02/2023, 9:40 AM *Please note that this is a verbal dictation therefore any spelling or grammatical errors are due to the "[employer]" system interpretation. If 7PM-7AM, please contact night-coverage [email or web address]

## 2023-06-02 NOTE — Progress Notes (Signed)
 [facility] consult for PIV placement. Arrived and assess patient's R arm. L arm restricted. No appropriate vein found. Spoke with MD regarding POC. Recommend CVC/PICC. Theophilus Fitz, RN [facility]

## 2023-06-02 NOTE — ED Triage Notes (Signed)
 Pt BIB EMS from home with c/o N/V/ abdominal pain. Was discharged from here for the same this AM. Pt A&O. Emesis is clear x 1 with ems.  162/120 100HR 16RR 99RA Cbg 223

## 2023-06-03 DIAGNOSIS — K3184 Gastroparesis: Secondary | ICD-10-CM | POA: Diagnosis not present

## 2023-06-03 DIAGNOSIS — E876 Hypokalemia: Secondary | ICD-10-CM | POA: Diagnosis not present

## 2023-06-03 DIAGNOSIS — E1143 Type 2 diabetes mellitus with diabetic autonomic (poly)neuropathy: Secondary | ICD-10-CM | POA: Diagnosis not present

## 2023-06-03 LAB — RAPID URINE DRUG SCREEN, HOSP PERFORMED
Amphetamines: NOT DETECTED
Barbiturates: NOT DETECTED
Benzodiazepines: NOT DETECTED
Cocaine: NOT DETECTED
Opiates: POSITIVE — AB
Tetrahydrocannabinol: NOT DETECTED

## 2023-06-03 LAB — COMPREHENSIVE METABOLIC PANEL WITH GFR
ALT: 14 U/L (ref 0–44)
AST: 13 U/L — ABNORMAL LOW (ref 15–41)
Albumin: 2.8 g/dL — ABNORMAL LOW (ref 3.5–5.0)
Alkaline Phosphatase: 73 U/L (ref 38–126)
Anion gap: 14 (ref 5–15)
BUN: 6 mg/dL (ref 6–20)
CO2: 19 mmol/L — ABNORMAL LOW (ref 22–32)
Calcium: 8.6 mg/dL — ABNORMAL LOW (ref 8.9–10.3)
Chloride: 103 mmol/L (ref 98–111)
Creatinine, Ser: 0.8 mg/dL (ref 0.44–1.00)
GFR, Estimated: >60 mL/min (ref >60)
Glucose, Bld: 253 mg/dL — ABNORMAL HIGH (ref 70–99)
Potassium: 3.4 mmol/L — ABNORMAL LOW (ref 3.5–5.1)
Sodium: 136 mmol/L (ref 135–145)
Total Bilirubin: 1 mg/dL (ref 0.0–1.2)
Total Protein: 6.3 g/dL — ABNORMAL LOW (ref 6.5–8.1)

## 2023-06-03 LAB — PREGNANCY, URINE: Preg Test, Ur: NEGATIVE

## 2023-06-03 LAB — CBG MONITORING, ED: Glucose-Capillary: 202 mg/dL — ABNORMAL HIGH (ref 70–99)

## 2023-06-03 LAB — GLUCOSE, CAPILLARY: Glucose-Capillary: 225 mg/dL — ABNORMAL HIGH (ref 70–99)

## 2023-06-03 LAB — LIPASE, BLOOD: Lipase: 19 U/L (ref 11–51)

## 2023-06-03 LAB — TSH: TSH: 0.416 u[IU]/mL (ref 0.350–4.500)

## 2023-06-03 MED ORDER — ORAL CARE MOUTH RINSE
15.0000 mL | OROMUCOSAL | Status: DC
  Administered 2023-06-04 – 2023-06-10 (×24): 15 mL via OROMUCOSAL

## 2023-06-03 MED ORDER — ATORVASTATIN CALCIUM 80 MG PO TABS
80.0000 mg | ORAL_TABLET | Freq: Every evening | ORAL | Status: DC
  Administered 2023-06-03 – 2023-06-10 (×7): 80 mg via ORAL
  Filled 2023-06-03 (×8): qty 1

## 2023-06-03 MED ORDER — POTASSIUM CHLORIDE 2 MEQ/ML IV SOLN
INTRAVENOUS | Status: AC
  Filled 2023-06-03 (×3): qty 1000

## 2023-06-03 MED ORDER — HYDROMORPHONE HCL 1 MG/ML IJ SOLN
2.0000 mg | Freq: Once | INTRAMUSCULAR | Status: AC
  Administered 2023-06-03: 2 mg via INTRAVENOUS
  Filled 2023-06-03: qty 2

## 2023-06-03 MED ORDER — LORAZEPAM 2 MG/ML IJ SOLN
1.0000 mg | Freq: Once | INTRAMUSCULAR | Status: AC
  Administered 2023-06-03: 1 mg via INTRAVENOUS
  Filled 2023-06-03: qty 1

## 2023-06-03 MED ORDER — RANOLAZINE ER 500 MG PO TB12
500.0000 mg | ORAL_TABLET | Freq: Two times a day (BID) | ORAL | Status: DC
  Administered 2023-06-03 – 2023-06-10 (×15): 500 mg via ORAL
  Filled 2023-06-03 (×16): qty 1

## 2023-06-03 MED ORDER — GABAPENTIN 100 MG PO CAPS
100.0000 mg | ORAL_CAPSULE | Freq: Three times a day (TID) | ORAL | Status: DC
  Administered 2023-06-03 – 2023-06-10 (×17): 100 mg via ORAL
  Filled 2023-06-03 (×20): qty 1

## 2023-06-03 MED ORDER — ORAL CARE MOUTH RINSE: 15.0000 mL | OROMUCOSAL | Status: DC | PRN

## 2023-06-03 MED ORDER — ONDANSETRON HCL 4 MG PO TABS: 4.0000 mg | ORAL_TABLET | Freq: Four times a day (QID) | ORAL | Status: DC | PRN

## 2023-06-03 MED ORDER — METOCLOPRAMIDE HCL 5 MG/ML IJ SOLN
10.0000 mg | Freq: Four times a day (QID) | INTRAMUSCULAR | Status: AC
  Administered 2023-06-03 (×3): 10 mg via INTRAVENOUS
  Filled 2023-06-03 (×4): qty 2

## 2023-06-03 MED ORDER — INSULIN ASPART 100 UNIT/ML IJ SOLN
0.0000 [IU] | Freq: Every day | INTRAMUSCULAR | Status: DC
  Administered 2023-06-03 – 2023-06-09 (×3): 2 [IU] via SUBCUTANEOUS

## 2023-06-03 MED ORDER — METOPROLOL TARTRATE 50 MG PO TABS
50.0000 mg | ORAL_TABLET | Freq: Two times a day (BID) | ORAL | Status: DC
  Administered 2023-06-03 – 2023-06-10 (×15): 50 mg via ORAL
  Filled 2023-06-03 (×6): qty 1
  Filled 2023-06-03: qty 2
  Filled 2023-06-03 (×9): qty 1

## 2023-06-03 MED ORDER — TOPIRAMATE 25 MG PO TABS
50.0000 mg | ORAL_TABLET | Freq: Every day | ORAL | Status: DC
  Administered 2023-06-03 – 2023-06-10 (×8): 50 mg via ORAL
  Filled 2023-06-03 (×8): qty 2

## 2023-06-03 MED ORDER — HYDROMORPHONE HCL 1 MG/ML IJ SOLN
0.5000 mg | INTRAMUSCULAR | Status: DC | PRN
  Administered 2023-06-03 – 2023-06-06 (×6): 1 mg via INTRAVENOUS
  Filled 2023-06-03 (×6): qty 1

## 2023-06-03 MED ORDER — SODIUM CHLORIDE 0.9% FLUSH
3.0000 mL | Freq: Two times a day (BID) | INTRAVENOUS | Status: DC
  Administered 2023-06-03 – 2023-06-10 (×10): 3 mL via INTRAVENOUS

## 2023-06-03 MED ORDER — METHIMAZOLE 5 MG PO TABS
5.0000 mg | ORAL_TABLET | Freq: Every morning | ORAL | Status: DC
  Administered 2023-06-03 – 2023-06-10 (×7): 5 mg via ORAL
  Filled 2023-06-03 (×9): qty 1

## 2023-06-03 MED ORDER — CLOPIDOGREL BISULFATE 75 MG PO TABS
75.0000 mg | ORAL_TABLET | Freq: Every day | ORAL | Status: DC
  Administered 2023-06-03 – 2023-06-10 (×8): 75 mg via ORAL
  Filled 2023-06-03 (×8): qty 1

## 2023-06-03 MED ORDER — DIPHENHYDRAMINE HCL 50 MG/ML IJ SOLN
12.5000 mg | Freq: Four times a day (QID) | INTRAMUSCULAR | Status: AC | PRN
  Administered 2023-06-03: 12.5 mg via INTRAVENOUS
  Filled 2023-06-03: qty 1

## 2023-06-03 MED ORDER — POLYETHYLENE GLYCOL 3350 17 G PO PACK: 17.0000 g | PACK | Freq: Every day | ORAL | Status: DC | PRN

## 2023-06-03 MED ORDER — TAMSULOSIN HCL 0.4 MG PO CAPS
0.4000 mg | ORAL_CAPSULE | Freq: Every day | ORAL | Status: DC
  Administered 2023-06-03 – 2023-06-10 (×8): 0.4 mg via ORAL
  Filled 2023-06-03 (×8): qty 1

## 2023-06-03 MED ORDER — ESCITALOPRAM OXALATE 20 MG PO TABS
20.0000 mg | ORAL_TABLET | Freq: Every morning | ORAL | Status: DC
  Administered 2023-06-03 – 2023-06-10 (×7): 20 mg via ORAL
  Filled 2023-06-03 (×2): qty 1
  Filled 2023-06-03: qty 2
  Filled 2023-06-03 (×5): qty 1

## 2023-06-03 MED ORDER — PANTOPRAZOLE SODIUM 40 MG IV SOLR
40.0000 mg | INTRAVENOUS | Status: DC
  Administered 2023-06-03 – 2023-06-06 (×4): 40 mg via INTRAVENOUS
  Filled 2023-06-03 (×4): qty 10

## 2023-06-03 MED ORDER — METOCLOPRAMIDE HCL 5 MG/ML IJ SOLN
10.0000 mg | Freq: Once | INTRAMUSCULAR | Status: AC
  Administered 2023-06-03: 10 mg via INTRAVENOUS
  Filled 2023-06-03: qty 2

## 2023-06-03 MED ORDER — INSULIN ASPART 100 UNIT/ML IJ SOLN
0.0000 [IU] | Freq: Three times a day (TID) | INTRAMUSCULAR | Status: DC
  Administered 2023-06-03: 3 [IU] via SUBCUTANEOUS
  Administered 2023-06-04: 1 [IU] via SUBCUTANEOUS
  Administered 2023-06-04 – 2023-06-05 (×3): 2 [IU] via SUBCUTANEOUS
  Administered 2023-06-06 (×2): 1 [IU] via SUBCUTANEOUS
  Administered 2023-06-07 (×3): 2 [IU] via SUBCUTANEOUS
  Administered 2023-06-08: 3 [IU] via SUBCUTANEOUS
  Administered 2023-06-08 (×2): 2 [IU] via SUBCUTANEOUS
  Administered 2023-06-09: 1 [IU] via SUBCUTANEOUS
  Administered 2023-06-09: 3 [IU] via SUBCUTANEOUS
  Administered 2023-06-09 – 2023-06-10 (×4): 2 [IU] via SUBCUTANEOUS

## 2023-06-03 MED ORDER — ACETAMINOPHEN 325 MG PO TABS
650.0000 mg | ORAL_TABLET | Freq: Four times a day (QID) | ORAL | Status: DC | PRN
  Administered 2023-06-03 – 2023-06-05 (×2): 650 mg via ORAL
  Filled 2023-06-03 (×2): qty 2

## 2023-06-03 MED ORDER — BACLOFEN 10 MG PO TABS
10.0000 mg | ORAL_TABLET | Freq: Three times a day (TID) | ORAL | Status: DC
  Administered 2023-06-03 – 2023-06-10 (×17): 10 mg via ORAL
  Filled 2023-06-03 (×20): qty 1

## 2023-06-03 MED ORDER — SODIUM CHLORIDE 0.9 % IV BOLUS
1000.0000 mL | Freq: Once | INTRAVENOUS | Status: AC
  Administered 2023-06-03: 1000 mL via INTRAVENOUS

## 2023-06-03 MED ORDER — RIVAROXABAN 10 MG PO TABS
10.0000 mg | ORAL_TABLET | Freq: Every day | ORAL | Status: DC
  Administered 2023-06-03 – 2023-06-10 (×7): 10 mg via ORAL
  Filled 2023-06-03 (×8): qty 1

## 2023-06-03 MED ORDER — ALBUTEROL SULFATE (2.5 MG/3ML) 0.083% IN NEBU: 2.5000 mg | INHALATION_SOLUTION | Freq: Four times a day (QID) | RESPIRATORY_TRACT | Status: DC | PRN

## 2023-06-03 MED ORDER — SENNOSIDES-DOCUSATE SODIUM 8.6-50 MG PO TABS
1.0000 | ORAL_TABLET | Freq: Two times a day (BID) | ORAL | Status: DC
  Administered 2023-06-03 – 2023-06-10 (×15): 1 via ORAL
  Filled 2023-06-03 (×16): qty 1

## 2023-06-03 MED ORDER — ONDANSETRON HCL 4 MG/2ML IJ SOLN
4.0000 mg | Freq: Four times a day (QID) | INTRAMUSCULAR | Status: DC | PRN
  Administered 2023-06-03 – 2023-06-10 (×7): 4 mg via INTRAVENOUS
  Filled 2023-06-03 (×7): qty 2

## 2023-06-03 NOTE — Plan of Care (Signed)

## 2023-06-03 NOTE — ED Provider Notes (Signed)
 [facility] AT [facility] Provider Note   CSN: [id number] Arrival date & time: 06/02/23  2029     History Chief Complaint  Patient presents with   Abdominal Pain    Lori [name] is a 53 y.o. female.  Patient with past history significant for CHF, type 2 diabetes, hypertension, diabetic as paresis presents emergency department today with concerns of abdominal pain.  Patient was recently admitted and discharged earlier today from the hospital but reports that she had onset of nausea, vomiting, and lower abdominal pain.  Her pain is consistent with most recent ED presentation.  Vomited once with EMS with no vomiting seen in the emergency department at this point.   Abdominal Pain      Home Medications Prior to Admission medications   Medication Sig Start Date End Date Taking? Authorizing Provider  acetaminophen  (TYLENOL ) 325 MG tablet Take 2 tablets (650 mg total) by mouth every 6 (six) hours as needed for mild pain (pain score 1-3) (or Fever >/= 101). 02/12/23   Armenta Landau, MD  albuterol  (PROVENTIL ) (2.5 MG/3ML) 0.083% nebulizer solution Take 3 mLs (2.5 mg total) by nebulization every 6 (six) hours as needed for wheezing or shortness of breath. 01/01/23   Ngetich, Dinah C, NP  albuterol  (VENTOLIN  HFA) 108 (90 Base) MCG/ACT inhaler INHALE 2 PUFFS BY MOUTH EVERY 6 HOURS AS NEEDED FOR WHEEZING AND FOR SHORTNESS OF BREATH Strength: 108 (90 Base) MCG/ACT 01/01/23   Ngetich, Dinah C, NP  atorvastatin  (LIPITOR ) 80 MG tablet TAKE 1 TABLET (80 MG TOTAL) BY MOUTH DAILY. Patient taking differently: Take 80 mg by mouth every evening. 03/31/23   Ngetich, Dinah C, NP  baclofen  (LIORESAL ) 10 MG tablet Take 1 tablet (10 mg total) by mouth 3 (three) times daily. 01/08/23   Ngetich, Dinah C, NP  clopidogrel  (PLAVIX ) 75 MG tablet TAKE 1 TABLET BY MOUTH DAILY 04/09/23   Ngetich, Dinah C, NP  Continuous Glucose Receiver (FREESTYLE LIBRE 2 READER) DEVI  1 Device by Does not apply route daily. E11.69 11/01/22   Ngetich, Dinah C, NP  Continuous Glucose Sensor (FREESTYLE LIBRE 14 DAY SENSOR) MISC 1 each by Does not apply route as needed. DX: E11.69 12/24/22   Ngetich, Dinah C, NP  cyclobenzaprine  (FLEXERIL ) 5 MG tablet Take 1 tablet (5 mg total) by mouth 3 (three) times daily as needed for muscle spasms. 05/07/23   Ngetich, Dinah C, NP  diclofenac  Sodium (VOLTAREN ) 1 % GEL Apply 1 Application topically 4 (four) times daily as needed (pain). 05/15/23   Ngetich, Dinah C, NP  escitalopram  (LEXAPRO ) 20 MG tablet Take 1 tablet (20 mg total) by mouth every morning. 01/01/23   Ngetich, Dinah C, NP  ezetimibe  (ZETIA ) 10 MG tablet TAKE 1 TABLET BY MOUTH DAILY 04/09/23   Ngetich, Dinah C, NP  ferrous sulfate  325 (65 FE) MG tablet Take 1 tablet (325 mg total) by mouth daily with breakfast. 05/15/23   Ngetich, Dinah C, NP  gabapentin  (NEURONTIN ) 100 MG capsule Take 100 mg by mouth 3 (three) times daily. 11/14/22   [provider]  HUMALOG  KWIKPEN 100 UNIT/ML KwikPen INJECT 4 UNITS SUBCUTANEOUSLY THREE TIMES A DAY WITH MEALS Patient taking differently: Inject 7 Units into the skin with breakfast, with lunch, and with evening meal. 05/07/23   Ngetich, Dinah C, NP  insulin  glargine (LANTUS  SOLOSTAR) 100 UNIT/ML Solostar Pen Inject 10 Units into the skin at bedtime. 05/07/23   Ngetich, Dinah C, NP  insulin  lispro (  HUMALOG ) 100 UNIT/ML injection Sliding scale AC, HS CBG 121 - 150: 2 units CBG 151 - 200: 3 units CBG 201 - 250: 5 units CBG 251 - 300: 8 units CBG 301 - 350: 11 units CBG 351 - 400: 15 units CBG > 400: call MD Patient not taking: Reported on 05/29/2023 05/07/23   Ngetich, Dinah C, NP  methimazole  (TAPAZOLE ) 5 MG tablet TAKE 1 TABLET BY MOUTH EVERY  MORNING 04/09/23   Ngetich, Dinah C, NP  metoCLOPramide  (REGLAN ) 10 MG tablet Take 1 tablet (10 mg total) by mouth every 8 (eight) hours as needed for refractory nausea / vomiting. 05/13/23 05/12/24  Adhikari, Amrit, MD   metoprolol  tartrate (LOPRESSOR ) 50 MG tablet Take 50 mg by mouth 2 (two) times daily.    [provider]  olmesartan -hydrochlorothiazide  (BENICAR  HCT) 40-25 MG tablet Take 1 tablet by mouth daily. 05/14/23   [provider]  ondansetron  (ZOFRAN -ODT) 4 MG disintegrating tablet Take 1 tablet (4 mg total) by mouth every 8 (eight) hours as needed for nausea. Patient not taking: Reported on 05/29/2023 05/28/23   Hazleton Bureau, PA-C  pantoprazole  (PROTONIX ) 40 MG tablet Take 1 tablet (40 mg total) by mouth 2 (two) times daily. 05/15/23   Ngetich, Dinah C, NP  ranolazine  (RANEXA ) 500 MG 12 hr tablet Take 1 tablet (500 mg total) by mouth 2 (two) times daily. 10/09/22 01/23/24  [place], Rema Care, DO  rivaroxaban  (XARELTO ) 10 MG TABS tablet TAKE 1 TABLET BY MOUTH DAILY 04/09/23   Ngetich, Dinah C, NP  senna-docusate (SENOKOT-S) 8.6-50 MG tablet Take 1 tablet by mouth 2 (two) times daily. 02/12/23   Armenta Landau, MD  tamsulosin  (FLOMAX ) 0.4 MG CAPS capsule Take 1 capsule (0.4 mg total) by mouth at bedtime. 05/07/23   Ngetich, Dinah C, NP  topiramate  (TOPAMAX ) 25 MG tablet Take 1 tablet (25 mg total) by mouth 2 (two) times daily. Patient taking differently: Take 50 mg by mouth at bedtime. 07/10/22   Doreene Gammon D, DO      Allergies    Carlena Chatters ], Contrast media [iodinated contrast media], Imitrex [sumatriptan], Ms contin  [morphine ], Penicillins, Chlorhexidine  gluconate [chlorhexidine ], Firvanq  [vancomycin ], Cipro  [ciprofloxacin  hcl], Nitrofuran derivatives, and Wound dressing adhesive    Review of Systems   Review of Systems  Gastrointestinal:  Positive for abdominal pain.  All other systems reviewed and are negative.   Physical Exam Updated Vital Signs BP (!) 184/89   Pulse (!) 111   Temp 98.7 F (37.1 C)   Resp 15   Ht 5\' 2"  (1.575 m)   Wt 81 kg   SpO2 100%   BMI 32.66 kg/m  Physical Exam Vitals and nursing note reviewed.  Constitutional:      General: She is in acute  distress.     Appearance: She is well-developed.  HENT:     Head: Normocephalic and atraumatic.  Eyes:     Conjunctiva/sclera: Conjunctivae normal.  Cardiovascular:     Rate and Rhythm: Normal rate and regular rhythm.     Heart sounds: No murmur heard. Pulmonary:     Effort: Pulmonary effort is normal. No respiratory distress.     Breath sounds: Normal breath sounds.  Abdominal:     General: Bowel sounds are decreased.     Palpations: Abdomen is soft.     Tenderness: There is abdominal tenderness in the right lower quadrant, suprapubic area and left lower quadrant.  Musculoskeletal:        General: No swelling.  Cervical back: Neck supple.  Skin:    General: Skin is warm and dry.     Capillary Refill: Capillary refill takes less than 2 seconds.  Neurological:     Mental Status: She is alert.  Psychiatric:        Mood and Affect: Mood normal.     ED Results / Procedures / Treatments   Labs (all labs ordered are listed, but only abnormal results are displayed) Labs Reviewed  CBC WITH DIFFERENTIAL/PLATELET - Abnormal; Notable for the following components:      Result Value   WBC 11.3 (*)    Hemoglobin 11.1 (*)    HCT 33.3 (*)    Neutro Abs 10.5 (*)    Lymphs Abs 0.6 (*)    All other components within normal limits  COMPREHENSIVE METABOLIC PANEL WITH GFR - Abnormal; Notable for the following components:   Potassium 3.4 (*)    CO2 19 (*)    Glucose, Bld 253 (*)    Calcium  8.6 (*)    Total Protein 6.3 (*)    Albumin  2.8 (*)    AST 13 (*)    All other components within normal limits  LIPASE, BLOOD    EKG None  Radiology US  EKG SITE RITE Result Date: 06/02/2023 If Site Rite image not attached, placement could not be confirmed due to current cardiac rhythm.   Procedures Procedures    Medications Ordered in ED Medications  sodium chloride  flush (NS) 0.9 % injection 10-40 mL (has no administration in time range)  sodium chloride  flush (NS) 0.9 % injection  10-40 mL (has no administration in time range)  sodium chloride  0.9 % bolus 1,000 mL (1,000 mLs Intravenous New Bag/Given 06/02/23 2332)  ondansetron  (ZOFRAN ) injection 4 mg (4 mg Intravenous Given 06/02/23 2332)  fentaNYL  (SUBLIMAZE ) injection 25 mcg (25 mcg Intravenous Given 06/03/23 0004)  metoCLOPramide  (REGLAN ) injection 10 mg (10 mg Intravenous Given 06/03/23 0015)    ED Course/ Medical Decision Making/ A&P                                 Medical Decision Making Amount and/or Complexity of Data Reviewed Labs: ordered.  Risk Prescription drug management.   This patient presents to the ED for concern of abdominal pain.  Differential diagnosis includes gastroparesis, intractable nausea and vomiting, gastroenteritis, bowel obstruction   Lab Tests:  I Ordered, and personally interpreted labs.  The pertinent results include: CBC with leukocytosis at 11.63, CMP with mild hypokalemia 3.4, no evidence of AKI, lipase unremarkable   Medicines ordered and prescription drug management:  I ordered medication including fluids, Zofran , fentanyl  for hydration, nausea, pain Reevaluation of the patient after these medicines showed that the patient improved I have reviewed the patients home medicines and have made adjustments as needed   Problem List / ED Course:  Patient with complicated medical history consistent of CHF, type 2 diabetes, hypertension, diabetic gastroparesis presents to the emergency department today with concerns of abdominal pain.  Patient was discharged earlier today and had been stable at home until this afternoon.  She has not had recurrence of her nausea, vomiting, and abdominal pain.  Abdominal pain is in the lower abdomen consistent with prior episodes of abdominal pain.  Denies any urinary discomfort.  Denies any relief with home medications. Physical exam reveals an uncomfortable appearing patient.  There is tenderness to the lower abdomen.  Bowel sounds slightly  decreased.  Will likely require fluid resuscitation, antiemetics, and pain control.  With recent admission, patient likely to require admission once again.  No acute occasion for imaging as this is consistent with prior episodes of abdominal discomfort. IV team consult placed for access.  IV RN advised that peripheral IV not likely beneficial the patient will likely require a PICC line placement.  Order for PICC line placement placed. Basic labs are reassuring.  Patient finally receiving fluids, antiemetics, and pain medicines.  12:17 AM Care of Lori [name] transferred to [facility] and Dr. Candelaria Chaco at the end of my shift as the patient will require reassessment once labs/imaging have resulted. Patient presentation, ED course, and plan of care discussed with review of all pertinent labs and imaging. Please see his/her note for further details regarding further ED course and disposition. Plan at time of handoff is disposition per her symptomatic response to treatment. This may be altered or completely changed at the discretion of the oncoming team pending results of further workup.  Final Clinical Impression(s) / ED Diagnoses Final diagnoses:  None    Rx / DC Orders ED Discharge Orders     None         Concetta Dee, PA-C 06/03/23 0017    Hershel Los, MD 06/03/23 1513

## 2023-06-03 NOTE — Transitions of Care (Post Inpatient/ED Visit) (Unsigned)
 06/03/2023 Per chart review, patient returned to ED post discharge. TOC RN will monitor for admission. If admitted, [facility] outreach will not be indicated at this time.   Patient ID: Lori [name], female   DOB: 1970/09/10, 53 y.o.   MRN: [id number]  Tonia Frankel RN, CCM   [facility]BC[facility]-Population Health RN Care Manager [phone]

## 2023-06-03 NOTE — H&P (Addendum)
 History and Physical    Lori [name] ZOX:[id number] DOB: 01-29-1971 DOA: 06/02/2023  PCP: Estil Heman, NP   I have briefly reviewed patients previous medical reports in [facility].  Patient coming from: Home  Chief Complaint: Nausea vomiting and abdominal pain  HPI: Lori [name] is a 53 year old married female, lives at home with her spouse, wheelchair mobile, PMH of hypertension, hyperlipidemia, GERD, diabetes, gastroparesis, CAD status post CABG, hyperthyroidism on methimazole , carotid artery disease, CVA, dysphagia, residual left hemiplegia with contractures, anemia, anxiety, depression, migraines, obesity, OSA, asthma, DVT, pheochromocytoma, recent hospitalization 4/24 - 4/28 for gastroparesis flare, went home for a few hours and then returned back to the ED on 4/28 with recurrent complaints of nausea, vomiting and abdominal pain.  Reports that she had about 4 episodes of nausea and vomiting, "orange-colored fluid", no coughing around emesis or frank blood.  Some abdominal pain, mostly right lower quadrant.  States that she had not had a BM for 4 days and then had a large BM prior to return to ED with improvement in her abdominal pain.  After pain meds in the ED, reports some itching without skin rash.  States that she has had "22 strokes".  Also indicates that her gastroparesis flares have been more frequent in the last 6 months.  Denies any complaints including any UTI symptoms.  ED Course: Afebrile, mild transient tachycardia up to the 120s-130s which has since resolved, initially high BP on arrival at 180/103 but since then soft blood pressures.  Not hypoxic. Lab work significant for potassium of 3.4, glucose 253, albumin  2.8, normal lipase, total protein 6.3, hemoglobin 11.1, WBC 11.3.  Urine pregnancy test negative.  UDS positive for opiates. In ED RU E PICC line was placed, patient has received IV fentanyl  x 1 IV Dilaudid  x 1 IV  Ativan  x 1, IV Reglan  x 1, IV Zofran  x 1, 2 L IV fluid bolus.  Hospital admission requested.  Review of Systems:  All other systems reviewed and apart from HPI, are negative.  Past Medical History:  Diagnosis Date   Acute bronchitis 08/24/2021   Acute cystitis 12/26/2022   Acute metabolic encephalopathy 11/04/2022   Acute renal failure (ARF) (HCC) 04/27/2023   AKI (acute kidney injury) (HCC) 04/30/2022   Aspiration pneumonia (HCC) 10/13/2022   CAD (coronary artery disease)    Cerebral infarction, unspecified (HCC) 09/27/2012   Chest pain of uncertain etiology 10/06/2022   Depression    Diabetes mellitus without complication (HCC)    Diabetic hyperosmolar non-ketotic state (HCC) 01/19/2023   DKA (diabetic ketoacidosis) (HCC) 02/19/2021   History of CT scan    History of mammogram    History of MRI    Hypertension    Pheochromocytoma    Reversible cerebrovascular vasoconstriction syndrome    Spell of abnormal behavior    Stroke [facility])    Thyroid  disease     Past Surgical History:  Procedure Laterality Date   ABDOMINAL HYSTERECTOMY     BIOPSY  02/06/2023   Procedure: BIOPSY;  Surgeon: Ace Holder, MD;  Location: [facility] ENDOSCOPY;  Service: Gastroenterology;;   CESAREAN SECTION     3   CORONARY ARTERY BYPASS GRAFT     ESOPHAGOGASTRODUODENOSCOPY (EGD) WITH PROPOFOL  N/A 02/06/2023   Procedure: ESOPHAGOGASTRODUODENOSCOPY (EGD) WITH PROPOFOL ;  Surgeon: Ace Holder, MD;  Location: [facility] ENDOSCOPY;  Service: Gastroenterology;  Laterality: N/A;   Right adrenal gland removal for pheochromocytoma Right     Social History  reports  that she has never smoked. She has never used smokeless tobacco. She reports that she does not currently use alcohol. She reports that she does not use drugs.  Allergies  Allergen Reactions   Asa [Aspirin ] Anaphylaxis, Swelling and Other (See Comments)    Throat closes   Contrast Media [Iodinated Contrast Media] Anaphylaxis and Swelling   Imitrex  [Sumatriptan] Anaphylaxis   Ms Contin  [Morphine ] Anaphylaxis and Swelling   Penicillins Swelling and Other (See Comments)    Mouth and eyes swell   Chlorhexidine  Gluconate [Chlorhexidine ] Itching and Rash   Firvanq  [Vancomycin ] Other (See Comments)    Red Man's Syndrome   Cipro  [Ciprofloxacin  Hcl] Nausea And Vomiting   Nitrofuran Derivatives Anxiety   Wound Dressing Adhesive Itching and Rash    Family History  Problem Relation Age of Onset   Hypertension Mother    Diabetes Mother    Atrial fibrillation Mother    Hypertension Father    Heart attack Father    Dementia Father    Diabetes Brother    Brain cancer Brother    Asthma Daughter    Sickle cell anemia Son    Atrial fibrillation Maternal Uncle      Prior to Admission medications   Medication Sig Start Date End Date Taking? Authorizing Provider  acetaminophen  (TYLENOL ) 325 MG tablet Take 2 tablets (650 mg total) by mouth every 6 (six) hours as needed for mild pain (pain score 1-3) (or Fever >/= 101). 02/12/23  Yes Armenta Landau, MD  albuterol  (PROVENTIL ) (2.5 MG/3ML) 0.083% nebulizer solution Take 3 mLs (2.5 mg total) by nebulization every 6 (six) hours as needed for wheezing or shortness of breath. 01/01/23  Yes Ngetich, Dinah C, NP  albuterol  (VENTOLIN  HFA) 108 (90 Base) MCG/ACT inhaler INHALE 2 PUFFS BY MOUTH EVERY 6 HOURS AS NEEDED FOR WHEEZING AND FOR SHORTNESS OF BREATH Strength: 108 (90 Base) MCG/ACT 01/01/23   Ngetich, Dinah C, NP  atorvastatin  (LIPITOR ) 80 MG tablet TAKE 1 TABLET (80 MG TOTAL) BY MOUTH DAILY. Patient taking differently: Take 80 mg by mouth every evening. 03/31/23   Ngetich, Dinah C, NP  baclofen  (LIORESAL ) 10 MG tablet Take 1 tablet (10 mg total) by mouth 3 (three) times daily. 01/08/23   Ngetich, Dinah C, NP  clopidogrel  (PLAVIX ) 75 MG tablet TAKE 1 TABLET BY MOUTH DAILY 04/09/23   Ngetich, Dinah C, NP  Continuous Glucose Receiver (FREESTYLE LIBRE 2 READER) DEVI 1 Device by Does not apply route  daily. E11.69 11/01/22   Ngetich, Dinah C, NP  Continuous Glucose Sensor (FREESTYLE LIBRE 14 DAY SENSOR) MISC 1 each by Does not apply route as needed. DX: E11.69 12/24/22   Ngetich, Dinah C, NP  cyclobenzaprine  (FLEXERIL ) 5 MG tablet Take 1 tablet (5 mg total) by mouth 3 (three) times daily as needed for muscle spasms. 05/07/23   Ngetich, Dinah C, NP  diclofenac  Sodium (VOLTAREN ) 1 % GEL Apply 1 Application topically 4 (four) times daily as needed (pain). 05/15/23   Ngetich, Dinah C, NP  escitalopram  (LEXAPRO ) 20 MG tablet Take 1 tablet (20 mg total) by mouth every morning. 01/01/23   Ngetich, Dinah C, NP  ezetimibe  (ZETIA ) 10 MG tablet TAKE 1 TABLET BY MOUTH DAILY 04/09/23   Ngetich, Dinah C, NP  ferrous sulfate  325 (65 FE) MG tablet Take 1 tablet (325 mg total) by mouth daily with breakfast. 05/15/23   Ngetich, Dinah C, NP  gabapentin  (NEURONTIN ) 100 MG capsule Take 100 mg by mouth 3 (three) times daily. 11/14/22  [provider]  HUMALOG  KWIKPEN 100 UNIT/ML KwikPen INJECT 4 UNITS SUBCUTANEOUSLY THREE TIMES A DAY WITH MEALS Patient taking differently: Inject 7 Units into the skin with breakfast, with lunch, and with evening meal. 05/07/23   Ngetich, Dinah C, NP  insulin  glargine (LANTUS  SOLOSTAR) 100 UNIT/ML Solostar Pen Inject 10 Units into the skin at bedtime. 05/07/23   Ngetich, Dinah C, NP  insulin  lispro (HUMALOG ) 100 UNIT/ML injection Sliding scale AC, HS CBG 121 - 150: 2 units CBG 151 - 200: 3 units CBG 201 - 250: 5 units CBG 251 - 300: 8 units CBG 301 - 350: 11 units CBG 351 - 400: 15 units CBG > 400: call MD Patient not taking: Reported on 05/29/2023 05/07/23   Ngetich, Dinah C, NP  methimazole  (TAPAZOLE ) 5 MG tablet TAKE 1 TABLET BY MOUTH EVERY  MORNING 04/09/23   Ngetich, Dinah C, NP  metoCLOPramide  (REGLAN ) 10 MG tablet Take 1 tablet (10 mg total) by mouth every 8 (eight) hours as needed for refractory nausea / vomiting. 05/13/23 05/12/24  Leona Rake, MD  metoprolol  tartrate (LOPRESSOR ) 50  MG tablet Take 50 mg by mouth 2 (two) times daily.    [provider]  olmesartan -hydrochlorothiazide  (BENICAR  HCT) 40-25 MG tablet Take 1 tablet by mouth daily. 05/14/23   [provider]  ondansetron  (ZOFRAN -ODT) 4 MG disintegrating tablet Take 1 tablet (4 mg total) by mouth every 8 (eight) hours as needed for nausea. Patient not taking: Reported on 05/29/2023 05/28/23   Greentree Bureau, PA-C  pantoprazole  (PROTONIX ) 40 MG tablet Take 1 tablet (40 mg total) by mouth 2 (two) times daily. 05/15/23   Ngetich, Dinah C, NP  ranolazine  (RANEXA ) 500 MG 12 hr tablet Take 1 tablet (500 mg total) by mouth 2 (two) times daily. 10/09/22 01/23/24  [place], Rema Care, DO  rivaroxaban  (XARELTO ) 10 MG TABS tablet TAKE 1 TABLET BY MOUTH DAILY 04/09/23   Ngetich, Dinah C, NP  senna-docusate (SENOKOT-S) 8.6-50 MG tablet Take 1 tablet by mouth 2 (two) times daily. 02/12/23   Armenta Landau, MD  tamsulosin  (FLOMAX ) 0.4 MG CAPS capsule Take 1 capsule (0.4 mg total) by mouth at bedtime. 05/07/23   Ngetich, Dinah C, NP  topiramate  (TOPAMAX ) 25 MG tablet Take 1 tablet (25 mg total) by mouth 2 (two) times daily. Patient taking differently: Take 50 mg by mouth at bedtime. 07/10/22   Doreene Gammon D, DO    Physical Exam: Vitals:   06/03/23 0545 06/03/23 0600 06/03/23 0800 06/03/23 0910  BP: (!) 111/59 109/73 134/78   Pulse: (!) 111 (!) 107 89 94  Resp: 14 17  16   Temp:    99.2 F (37.3 C)  TempSrc:    Oral  SpO2: 96% 98% 99% 100%  Weight:      Height:          Constitutional: Young female, looks older than stated age, moderately built and obese lying comfortably propped up in bed without distress.  Oral mucosa dry.  Has soft voice, some questionable dysarthria. Eyes: PERTLA, lids and conjunctivae normal ENMT: Mucous membranes are dry. Posterior pharynx clear of any exudate or lesions. Normal dentition.  Neck: supple, no masses, no thyromegaly Respiratory: Clear to auscultation without wheezing,  rhonchi or crackles. No increased work of breathing. Cardiovascular: S1 & S2 heard, regular rate and rhythm. No JVD, murmurs, rubs or clicks. No pedal edema.  Telemetry personally reviewed: Sinus rhythm. Abdomen: Non distended. Non tender. Soft. No organomegaly or masses appreciated. No  clinical Ascites. Normal bowel sounds heard. Musculoskeletal: no clubbing / cyanosis. No joint deformity upper and lower extremities. Good ROM, no contractures.  Skin: no rashes, lesions, ulcers. No induration Neurologic: Some residual chronic facial asymmetry, possible dysarthria.  Left extremities at least grade 2 x 5 power.  Contracture of left hand/fingers.  Soft tone to voice. Psychiatric: Normal judgment and insight. Alert and oriented x 3. Normal mood.     Labs on Admission: I have personally reviewed following labs and imaging studies  CBC: Recent Labs  Lab 05/28/23 1246 05/29/23 1257 05/29/23 1303 05/29/23 1640 05/30/23 1140 06/01/23 0949 06/02/23 2328  WBC 7.7 11.8*  --  10.0 10.4 5.7 11.3*  NEUTROABS 4.1  --   --   --   --   --  10.5*  HGB 12.4 12.2 13.6  12.9 11.6* 11.2* 11.3* 11.1*  HCT 37.4 37.3 40.0  38.0 34.7* 33.8* 35.3* 33.3*  MCV 80.6 81.3  --  80.5 81.3 83.1 81.0  PLT 433* 380  --  341 322 254 348    Basic Metabolic Panel: Recent Labs  Lab 05/28/23 1246 05/29/23 1257 05/29/23 1303 05/30/23 1140 06/01/23 0949 06/02/23 2328  NA 134* 135 137  136 135 137 136  K 4.3 4.0 3.9  3.9 4.0 3.9 3.4*  CL 98 101 103 108 108 103  CO2 26 19*  --  18* 18* 19*  GLUCOSE 248* 265* 277* 205* 97 253*  BUN 27* 12 13 7 6 6   CREATININE 1.02* 0.79 0.80 0.60 0.72 0.80  CALCIUM  9.4 8.7*  --  8.0* 8.1* 8.6*    Liver Function Tests: Recent Labs  Lab 05/28/23 1246 05/29/23 1257 05/30/23 1140 06/02/23 2328  AST 25 18 17  13*  ALT 18 17 16 14   ALKPHOS 70 66 56 73  BILITOT 0.7 0.7 0.8 1.0  PROT 7.7 6.8 5.8* 6.3*  ALBUMIN  3.5 3.2* 2.7* 2.8*    Urine analysis:    Component Value  Date/Time   COLORURINE YELLOW 05/28/2023 1613   APPEARANCEUR HAZY (A) 05/28/2023 1613   LABSPEC 1.011 05/28/2023 1613   PHURINE 5.0 05/28/2023 1613   GLUCOSEU NEGATIVE 05/28/2023 1613   HGBUR NEGATIVE 05/28/2023 1613   BILIRUBINUR NEGATIVE 05/28/2023 1613   KETONESUR NEGATIVE 05/28/2023 1613   PROTEINUR NEGATIVE 05/28/2023 1613   NITRITE NEGATIVE 05/28/2023 1613   LEUKOCYTESUR NEGATIVE 05/28/2023 1613     Radiological Exams on Admission: US  EKG SITE RITE Result Date: 06/02/2023 If Site Rite image not attached, placement could not be confirmed due to current cardiac rhythm.   EKG: Independently reviewed.  Last EKG from 4/26 reviewed: Normal sinus rhythm, normal axis and no acute findings.  QTc of 457 ms.  Also reviewed CT abdomen and pelvis without contrast from 4/23 which showed no acute findings.  Assessment/Plan Principal Problem:   Diabetic gastroparesis (HCC) Active Problems:   Essential hypertension   Hyperlipidemia   Spastic hemiparesis of left 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 (HCC)   Hypothyroidism   GERD (gastroesophageal reflux disease)   Insulin  dependent type 2 diabetes mellitus (HCC)     Suspected diabetic gastroparesis flare/constipation Treat supportively with clear liquid diet and then advance diet gradually as tolerated.  Minimize opioids as much as able.  Scheduled IV Reglan  10 mg every 6 hours x 1 day and then can transition to prior home dose of oral Reglan , as needed IV Zofran , IV PPI, brief and gentle IV fluids.  Aggressive bowel regimen.  Hypokalemia Mild.  Replacing  IV fluids and follow BMP in AM.  Poorly controlled type II DM A1c 10 on 04/25/2023.  For now since trying on liquid diet, treat with NovoLog  sensitive SSI.  If she is able to tolerate advancing diet and or if CBGs consistently greater than 180, consider resuming home dose of long-acting insulin  and adjust SSI as needed  Essential hypertension Initially came in with  hypertension but then intermittent soft blood pressures.  Continue home dose of metoprolol  50 Mg twice daily but hold other meds to avoid hypotension.  Monitor.  Hyperthyroidism TSH normal.  Continue home dose of methimazole .  GERD Continue IV PPI  Hyperlipidemia Continue statins.  History of QTc prolongation Repeated EKG and personally reviewed, sinus rhythm, no acute changes and QTc 476 ms.  History of CVA with residual left hemiparesis Continue atorvastatin , Plavix  and also on Xarelto  for history of DVT  CAD s/p CABG Continue atorvastatin , beta-blocker & Plavix . Continue Ranexa   Anxiety and depression Continue Lexapro   History of DVT Continue home dose of Xarelto .  No GI bleed or any bleeding reported.   DVT prophylaxis: On Xarelto  anticoagulation Code Status: Full, confirmed with patient at bedside Family Communication: None at bedside Disposition Plan:   Patient is from:  Home  Anticipated DC to: Home  Anticipated DC date: 06/04/23  Anticipated DC barriers: None   Consults called: None Admission status: Observation, progressive bed  Severity of Illness: The appropriate patient status for this patient is OBSERVATION. Observation status is judged to be reasonable and necessary in order to provide the required intensity of service to ensure the patient's safety. The patient's presenting symptoms, physical exam findings, and initial radiographic and laboratory data in the context of their medical condition is felt to place them at decreased risk for further clinical deterioration. Furthermore, it is anticipated that the patient will be medically stable for discharge from the hospital within 2 midnights of admission.     Aubrey Blas MD FACP, [facility], [facility], [facility]   [facility]  Hospitalist & Physician Advisor [place]    To contact the attending provider between 7A-7P or the covering provider during after hours 7P-7A, please log into the web site [email or web address]  and access using universal [place] password for that web site. If you do not have the password, please call the hospital operator.  06/03/2023, 9:33 AM

## 2023-06-03 NOTE — ED Notes (Signed)
 Admitting provider at bedside.

## 2023-06-03 NOTE — ED Provider Notes (Signed)
  Physical Exam  BP 106/62   Pulse (!) 129   Temp 98.9 F (37.2 C) (Oral)   Resp (!) 28   Ht 5\' 2"  (1.575 m)   Wt 81 kg   SpO2 100%   BMI 32.66 kg/m   Physical Exam  Procedures  Procedures  ED Course / MDM    Medical Decision Making Amount and/or Complexity of Data Reviewed Labs: ordered.  Risk Prescription drug management.    Patient care assumed at shift handoff from previous provider.  In short, patient with past medical history significant for CHF, type II DM, hypertension, gastroparesis presents the emergency department complaining of continued abdominal pain, nausea, no vomiting.  Patient was admitted to the hospital last week for the same complaints.  She was discharged Monday morning and states that she had a very brief respite from symptoms but then had sudden onset of severe abdominal pain, nausea, and vomiting.  At time of assumption of care workup had been grossly unremarkable.  A PICC line had to be placed for venous access.  Patient remains tachycardic, continue to complain of pain and nausea.  Patient was treated with Zofran , Reglan , Ativan  with continued nausea.  Patient also treated with fentanyl  for pain.  At this time patient continues to have severe symptoms and I feel that she would benefit from readmission for further management.  I requested consultation with the hospitalist. Dr.Kakrakandy agrees to see patient for admission      Delories Fetter 06/03/23 Josephina Nicks, MD 06/03/23 [phone]

## 2023-06-03 NOTE — ED Notes (Signed)
 Patient offloading off right side onto left side, towels rolled and placed.  Pillow placed between knees.

## 2023-06-04 DIAGNOSIS — R109 Unspecified abdominal pain: Secondary | ICD-10-CM

## 2023-06-04 DIAGNOSIS — K3184 Gastroparesis: Secondary | ICD-10-CM | POA: Diagnosis not present

## 2023-06-04 DIAGNOSIS — R112 Nausea with vomiting, unspecified: Secondary | ICD-10-CM | POA: Diagnosis not present

## 2023-06-04 DIAGNOSIS — E1143 Type 2 diabetes mellitus with diabetic autonomic (poly)neuropathy: Secondary | ICD-10-CM | POA: Diagnosis not present

## 2023-06-04 LAB — CBC
HCT: 27.2 % — ABNORMAL LOW (ref 36.0–46.0)
Hemoglobin: 9.2 g/dL — ABNORMAL LOW (ref 12.0–15.0)
MCH: 27.5 pg (ref 26.0–34.0)
MCHC: 33.8 g/dL (ref 30.0–36.0)
MCV: 81.4 fL (ref 80.0–100.0)
Platelets: 310 10*3/uL (ref 150–400)
RBC: 3.34 MIL/uL — ABNORMAL LOW (ref 3.87–5.11)
RDW: 16 % — ABNORMAL HIGH (ref 11.5–15.5)
WBC: 7.8 10*3/uL (ref 4.0–10.5)
nRBC: 0 % (ref 0.0–0.2)

## 2023-06-04 LAB — BASIC METABOLIC PANEL WITH GFR
Anion gap: 8 (ref 5–15)
BUN: 5 mg/dL — ABNORMAL LOW (ref 6–20)
CO2: 24 mmol/L (ref 22–32)
Calcium: 8.2 mg/dL — ABNORMAL LOW (ref 8.9–10.3)
Chloride: 107 mmol/L (ref 98–111)
Creatinine, Ser: 0.74 mg/dL (ref 0.44–1.00)
GFR, Estimated: >60 mL/min (ref >60)
Glucose, Bld: 148 mg/dL — ABNORMAL HIGH (ref 70–99)
Potassium: 3.2 mmol/L — ABNORMAL LOW (ref 3.5–5.1)
Sodium: 139 mmol/L (ref 135–145)

## 2023-06-04 LAB — GLUCOSE, CAPILLARY
Glucose-Capillary: 129 mg/dL — ABNORMAL HIGH (ref 70–99)
Glucose-Capillary: 183 mg/dL — ABNORMAL HIGH (ref 70–99)
Glucose-Capillary: 119 mg/dL — ABNORMAL HIGH (ref 70–99)
Glucose-Capillary: 150 mg/dL — ABNORMAL HIGH (ref 70–99)

## 2023-06-04 NOTE — Plan of Care (Signed)
   Problem: Education: Goal: Knowledge of General Education information will improve Description Including pain rating scale, medication(s)/side effects and non-pharmacologic comfort measures Outcome: Progressing

## 2023-06-04 NOTE — Plan of Care (Signed)

## 2023-06-04 NOTE — Progress Notes (Signed)
 Progress Note   Patient: Lori [name] [name] ZHY:[id number] DOB: 1970-11-24 DOA: 06/02/2023     0 DOS: the patient was seen and examined on 06/04/2023   Brief hospital course: 53 year old married female, lives at home with her spouse, wheelchair mobile, PMH of hypertension, hyperlipidemia, GERD, diabetes, gastroparesis, CAD status post CABG, hyperthyroidism on methimazole , carotid artery disease, CVA, dysphagia, residual left hemiplegia with contractures, anemia, anxiety, depression, migraines, obesity, OSA, asthma, DVT, pheochromocytoma, recent hospitalization 4/24 - 4/28 for gastroparesis flare, went home for a few hours and then returned back to the ED on 4/28 with recurrent complaints of nausea, vomiting and abdominal pain.  Reports that she had about 4 episodes of nausea and vomiting, "orange-colored fluid", no coughing around emesis or frank blood.  Some abdominal pain, mostly right lower quadrant.  States that she had not had a BM for 4 days and then had a large BM prior to return to ED with improvement in her abdominal pain.  After pain meds in the ED, reports some itching without skin rash.  States that she has had "22 strokes".  Also indicates that her gastroparesis flares have been more frequent in the last 6 months.  Denies any complaints including any UTI symptoms.   ED Course: Afebrile, mild transient tachycardia up to the 120s-130s which has since resolved, initially high BP on arrival at 180/103 but since then soft blood pressures.  Not hypoxic. Lab work significant for potassium of 3.4, glucose 253, albumin  2.8, normal lipase, total protein 6.3, hemoglobin 11.1, WBC 11.3.  Urine pregnancy test negative.  UDS positive for opiates. In ED RU E PICC line was placed, patient has received IV fentanyl  x 1 IV Dilaudid  x 1 IV Ativan  x 1, IV Reglan  x 1, IV Zofran  x 1, 2 L IV fluid bolus.  Hospital admission requested.  Assessment and Plan: Suspected diabetic gastroparesis  flare/constipation Minimize opioids as much as able.  Scheduled IV Reglan  10 mg every 6 hours x 1 day and then can transition to prior home dose of oral Reglan , as needed IV Zofran , IV PPI, brief and gentle IV fluids. -Would cont with bowel regimen as needed -Toleated clears. Advance to full liquids   Hypokalemia Remains low, receiving IVF with KCl   Poorly controlled type II DM A1c 10 on 04/25/2023.  For now since trying on liquid diet, treat with NovoLog  sensitive SSI.   -Plan to resume home dose of long-acting insulin  and adjust SSI as pt's PO intake improves   Essential hypertension Initially came in with hypertension but then intermittent soft blood pressures.  Continue home dose of metoprolol  50 Mg twice daily but held other meds to avoid hypotension   Hyperthyroidism TSH normal.  Continue home dose of methimazole .   GERD Continue IV PPI   Hyperlipidemia Continue statins.   History of QTc prolongation Repeated EKG and personally reviewed, sinus rhythm, no acute changes and QTc 476 ms.   History of CVA with residual left hemiparesis Continue atorvastatin , Plavix  and also on Xarelto  for history of DVT   CAD s/p CABG Continue atorvastatin , beta-blocker & Plavix . Continue Ranexa    Anxiety and depression Continue Lexapro    History of DVT Continue home dose of Xarelto .  No GI bleed or any bleeding noted      Subjective: Reports feeling better today. Asking for cream of wheat  Physical Exam: Vitals:   06/03/23 1458 06/03/23 2009 06/04/23 0450 06/04/23 0851  BP: (!) 159/84 (!) 144/91 127/81 (!) 157/97  Pulse:  87  78 81  Resp:  16 16 18   Temp: 98 F (36.7 C) 98.5 F (36.9 C) 98.3 F (36.8 C) 98.2 F (36.8 C)  TempSrc: Oral Oral Oral Oral  SpO2: 95% 99% 99% 98%  Weight: 79.4 kg 82.7 kg    Height: 5\' 2"  (1.575 m)      General exam: Awake, laying in bed, in nad Respiratory system: Normal respiratory effort, no wheezing Cardiovascular system: regular rate, s1,  s2 Gastrointestinal system: Soft, nondistended, positive BS Central nervous system: CN2-12 grossly intact, strength intact Extremities: Perfused, no clubbing Skin: Normal skin turgor, no notable skin lesions seen Psychiatry: Mood normal // no visual hallucinations   Data Reviewed:  Labs reviewed: Na 139, K 3.2, Cr 0.74, WBC 7.8, Hgb 9.2, Plts 310  Family Communication: Pt in room, family not at bedside  Disposition: Status is: Observation The patient remains OBS appropriate and will d/c before 2 midnights.  Planned Discharge Destination: Home    Author: Cherylle Corwin, MD 06/04/2023 11:07 AM  For on call review [email or web address].

## 2023-06-04 NOTE — Hospital Course (Signed)
 53 year old married female, lives at home with her spouse, wheelchair mobile, PMH of hypertension, hyperlipidemia, GERD, diabetes, gastroparesis, CAD status post CABG, hyperthyroidism on methimazole , carotid artery disease, CVA, dysphagia, residual left hemiplegia with contractures, anemia, anxiety, depression, migraines, obesity, OSA, asthma, DVT, pheochromocytoma, recent hospitalization 4/24 - 4/28 for gastroparesis flare, went home for a few hours and then returned back to the ED on 4/28 with recurrent complaints of nausea, vomiting and abdominal pain.  Reports that she had about 4 episodes of nausea and vomiting, "orange-colored fluid", no coughing around emesis or frank blood.  Some abdominal pain, mostly right lower quadrant.  States that she had not had a BM for 4 days and then had a large BM prior to return to ED with improvement in her abdominal pain.  After pain meds in the ED, reports some itching without skin rash.  States that she has had "22 strokes".  Also indicates that her gastroparesis flares have been more frequent in the last 6 months.  Denies any complaints including any UTI symptoms.   ED Course: Afebrile, mild transient tachycardia up to the 120s-130s which has since resolved, initially high BP on arrival at 180/103 but since then soft blood pressures.  Not hypoxic. Lab work significant for potassium of 3.4, glucose 253, albumin  2.8, normal lipase, total protein 6.3, hemoglobin 11.1, WBC 11.3.  Urine pregnancy test negative.  UDS positive for opiates. In ED RU E PICC line was placed, patient has received IV fentanyl  x 1 IV Dilaudid  x 1 IV Ativan  x 1, IV Reglan  x 1, IV Zofran  x 1, 2 L IV fluid bolus.  Hospital admission requested.

## 2023-06-04 NOTE — Care Management Obs Status (Signed)
 MEDICARE OBSERVATION STATUS NOTIFICATION   Patient Details  Name: Lori [name] MRN: [id number] Date of Birth: 05/04/1970   Medicare Observation Status Notification Given:  Yes    Omie Bickers, RN 06/04/2023, 9:11 AM

## 2023-06-05 ENCOUNTER — Observation Stay (HOSPITAL_COMMUNITY)

## 2023-06-05 DIAGNOSIS — R112 Nausea with vomiting, unspecified: Secondary | ICD-10-CM | POA: Diagnosis not present

## 2023-06-05 DIAGNOSIS — E1143 Type 2 diabetes mellitus with diabetic autonomic (poly)neuropathy: Secondary | ICD-10-CM | POA: Diagnosis not present

## 2023-06-05 DIAGNOSIS — K3184 Gastroparesis: Secondary | ICD-10-CM | POA: Diagnosis not present

## 2023-06-05 DIAGNOSIS — R109 Unspecified abdominal pain: Secondary | ICD-10-CM | POA: Diagnosis not present

## 2023-06-05 LAB — GLUCOSE, CAPILLARY
Glucose-Capillary: 161 mg/dL — ABNORMAL HIGH (ref 70–99)
Glucose-Capillary: 155 mg/dL — ABNORMAL HIGH (ref 70–99)
Glucose-Capillary: 119 mg/dL — ABNORMAL HIGH (ref 70–99)
Glucose-Capillary: 104 mg/dL — ABNORMAL HIGH (ref 70–99)

## 2023-06-05 LAB — COMPREHENSIVE METABOLIC PANEL WITH GFR
ALT: 10 U/L (ref 0–44)
AST: 10 U/L — ABNORMAL LOW (ref 15–41)
Albumin: 2.2 g/dL — ABNORMAL LOW (ref 3.5–5.0)
Alkaline Phosphatase: 48 U/L (ref 38–126)
Anion gap: 8 (ref 5–15)
BUN: 5 mg/dL — ABNORMAL LOW (ref 6–20)
CO2: 22 mmol/L (ref 22–32)
Calcium: 8.1 mg/dL — ABNORMAL LOW (ref 8.9–10.3)
Chloride: 108 mmol/L (ref 98–111)
Creatinine, Ser: 0.67 mg/dL (ref 0.44–1.00)
GFR, Estimated: >60 mL/min (ref >60)
Glucose, Bld: 136 mg/dL — ABNORMAL HIGH (ref 70–99)
Potassium: 3.5 mmol/L (ref 3.5–5.1)
Sodium: 138 mmol/L (ref 135–145)
Total Bilirubin: 0.7 mg/dL (ref 0.0–1.2)
Total Protein: 4.8 g/dL — ABNORMAL LOW (ref 6.5–8.1)

## 2023-06-05 LAB — CBC
HCT: 27.5 % — ABNORMAL LOW (ref 36.0–46.0)
Hemoglobin: 9.3 g/dL — ABNORMAL LOW (ref 12.0–15.0)
MCH: 27.7 pg (ref 26.0–34.0)
MCHC: 33.8 g/dL (ref 30.0–36.0)
MCV: 81.8 fL (ref 80.0–100.0)
Platelets: 322 10*3/uL (ref 150–400)
RBC: 3.36 MIL/uL — ABNORMAL LOW (ref 3.87–5.11)
RDW: 16.2 % — ABNORMAL HIGH (ref 11.5–15.5)
WBC: 7.2 10*3/uL (ref 4.0–10.5)
nRBC: 0 % (ref 0.0–0.2)

## 2023-06-05 MED ORDER — METOCLOPRAMIDE HCL 5 MG/ML IJ SOLN
5.0000 mg | Freq: Three times a day (TID) | INTRAMUSCULAR | Status: DC
Start: 2023-06-05 — End: 2023-06-06
  Administered 2023-06-05 – 2023-06-06 (×3): 5 mg via INTRAVENOUS
  Filled 2023-06-05 (×3): qty 2

## 2023-06-05 NOTE — Plan of Care (Signed)
  Problem: Education: Goal: Individualized Educational Video(s) Outcome: Progressing   Problem: Coping: Goal: Ability to adjust to condition or change in health will improve Outcome: Progressing   Problem: Fluid Volume: Goal: Ability to maintain a balanced intake and output will improve Outcome: Progressing   Problem: Health Behavior/Discharge Planning: Goal: Ability to identify and utilize available resources and services will improve Outcome: Progressing Goal: Ability to manage health-related needs will improve Outcome: Progressing   Problem: Metabolic: Goal: Ability to maintain appropriate glucose levels will improve Outcome: Progressing   Problem: Nutritional: Goal: Maintenance of adequate nutrition will improve Outcome: Progressing Goal: Progress toward achieving an optimal weight will improve Outcome: Progressing   Problem: Skin Integrity: Goal: Risk for impaired skin integrity will decrease Outcome: Progressing   Problem: Tissue Perfusion: Goal: Adequacy of tissue perfusion will improve Outcome: Progressing   Problem: Education: Goal: Knowledge of General Education information will improve Description: Including pain rating scale, medication(s)/side effects and non-pharmacologic comfort measures Outcome: Progressing   Problem: Health Behavior/Discharge Planning: Goal: Ability to manage health-related needs will improve Outcome: Progressing   Problem: Clinical Measurements: Goal: Ability to maintain clinical measurements within normal limits will improve Outcome: Progressing Goal: Will remain free from infection Outcome: Progressing Goal: Diagnostic test results will improve Outcome: Progressing Goal: Respiratory complications will improve Outcome: Progressing Goal: Cardiovascular complication will be avoided Outcome: Progressing   Problem: Activity: Goal: Risk for activity intolerance will decrease Outcome: Progressing   Problem: Nutrition: Goal:  Adequate nutrition will be maintained Outcome: Progressing   Problem: Coping: Goal: Level of anxiety will decrease Outcome: Progressing   Problem: Elimination: Goal: Will not experience complications related to bowel motility Outcome: Progressing Goal: Will not experience complications related to urinary retention Outcome: Progressing   Problem: Pain Managment: Goal: General experience of comfort will improve and/or be controlled Outcome: Progressing   Problem: Safety: Goal: Ability to remain free from injury will improve Outcome: Progressing   Problem: Skin Integrity: Goal: Risk for impaired skin integrity will decrease Outcome: Progressing

## 2023-06-05 NOTE — Progress Notes (Signed)
 Progress Note   Patient: Lori [name] ZOX:[id number] DOB: Jun 30, 1970 DOA: 06/02/2023     0 DOS: the patient was seen and examined on 06/05/2023   Brief hospital course: 53 year old married female, lives at home with her spouse, wheelchair mobile, PMH of hypertension, hyperlipidemia, GERD, diabetes, gastroparesis, CAD status post CABG, hyperthyroidism on methimazole , carotid artery disease, CVA, dysphagia, residual left hemiplegia with contractures, anemia, anxiety, depression, migraines, obesity, OSA, asthma, DVT, pheochromocytoma, recent hospitalization 4/24 - 4/28 for gastroparesis flare, went home for a few hours and then returned back to the ED on 4/28 with recurrent complaints of nausea, vomiting and abdominal pain.  Reports that she had about 4 episodes of nausea and vomiting, "orange-colored fluid", no coughing around emesis or frank blood.  Some abdominal pain, mostly right lower quadrant.  States that she had not had a BM for 4 days and then had a large BM prior to return to ED with improvement in her abdominal pain.  After pain meds in the ED, reports some itching without skin rash.  States that she has had "22 strokes".  Also indicates that her gastroparesis flares have been more frequent in the last 6 months.  Denies any complaints including any UTI symptoms.   ED Course: Afebrile, mild transient tachycardia up to the 120s-130s which has since resolved, initially high BP on arrival at 180/103 but since then soft blood pressures.  Not hypoxic. Lab work significant for potassium of 3.4, glucose 253, albumin  2.8, normal lipase, total protein 6.3, hemoglobin 11.1, WBC 11.3.  Urine pregnancy test negative.  UDS positive for opiates. In ED RU E PICC line was placed, patient has received IV fentanyl  x 1 IV Dilaudid  x 1 IV Ativan  x 1, IV Reglan  x 1, IV Zofran  x 1, 2 L IV fluid bolus.  Hospital admission requested.  Assessment and Plan: Suspected diabetic gastroparesis  flare/constipation Minimize opioids as much as able.  . -Would cont with bowel regimen as needed -remains on liquid diet -Continues to complain of abd pain with eating. Will schedule reglan . EKG reviewed, QTc within normal limits -Will check f/u abd xray   Hypokalemia Normalized   Poorly controlled type II DM A1c 10 on 04/25/2023.  For now since trying on liquid diet, treat with NovoLog  sensitive SSI.   -Anticipate resuming long-acting insulin  and adjust SSI as pt's PO intake improves   Essential hypertension Initially came in with hypertension but then intermittent soft blood pressures.  Continue home dose of metoprolol  50 Mg twice daily but held other meds to avoid hypotension   Hyperthyroidism TSH normal.  Continue home dose of methimazole .   GERD Continue IV PPI   Hyperlipidemia Continue statins.   History of QTc prolongation Repeated EKG and personally reviewed, sinus rhythm, no acute changes and QTc 476 ms.   History of CVA with residual left hemiparesis Continue atorvastatin , Plavix  and also on Xarelto  for history of DVT   CAD s/p CABG Continue atorvastatin , beta-blocker & Plavix . Continue Ranexa    Anxiety and depression Continue Lexapro    History of DVT Continue home dose of Xarelto .  No GI bleed or any bleeding noted  Chronic diastolic CHF -Cont metoprolol  -Benicar  on hold secondary to soft bp above -Will f/u daily wts and I/O -Lasix  currently on hold secondary to decreased PO intake      Subjective: Complaining of abd pain with eating  Physical Exam: Vitals:   06/04/23 2121 06/05/23 0527 06/05/23 0855 06/05/23 0917  BP: 137/79 (!) 148/92 (!) 171/97 Aaron Aas)  147/89  Pulse: 72 79 69 70  Resp:  12 18 14   Temp: 98.3 F (36.8 C) (!) 97.5 F (36.4 C) 98.5 F (36.9 C) 99 F (37.2 C)  TempSrc: Oral Oral  Axillary  SpO2: 99% 100%    Weight:      Height:       General exam: Conversant, in no acute distress Respiratory system: normal chest rise, clear, no  audible wheezing Cardiovascular system: regular rhythm, s1-s2 Gastrointestinal system: Nondistended, decreased BS Central nervous system: No seizures, no tremors Extremities: No cyanosis, no joint deformities Skin: No rashes, no pallor Psychiatry: Affect normal // no auditory hallucinations   Data Reviewed:  Labs reviewed: Na 138, K 3.5, Cr 0.67, WBC 7.2, Hgb 9.3, Plts 322  Family Communication: Pt in room, family not at bedside  Disposition: Status is: Observation The patient remains OBS appropriate and will d/c before 2 midnights.  Planned Discharge Destination: Home    Author: Cherylle Corwin, MD 06/05/2023 5:15 PM  For on call review [email or web address].

## 2023-06-05 NOTE — TOC CM/SW Note (Signed)
 Transition of Care [facility]) - Inpatient Brief Assessment   Patient Details  Name: Lori [name] MRN: [id number] Date of Birth: 01/08/1971  Transition of Care [facility]) CM/SW Contact:    Tom-Johnson, Angelique Ken, RN Phone Number: 06/05/2023, 4:52 PM   Clinical Narrative:  Patient presented to the ED with persistent Abdominal pain with N/V. Admitted with Diabetic Gastroparesis. Patient recently admitted  from 05/29/23 - 06/02/23 with Gastroparesis. Receiving supportive treatment.  Has hx of CAD status post CABG, CVA with Lt hemiplegia and Contracture, Dysphagia, HTN,HLD, GERD, DM, Hyperthyroidism on Methimazole , Anemia, Anxiety, Depression, Migraines, OSA, Asthma, DVT and Pheochromocytoma. On Xarelto .   From home with husband, has four supportive children. Both parents and nine brothers supportive. On disability, Husband assists with care at home. Has a w/c, shower seat and grab bars at home.  Patient is active with Home health services through [facility] notified of admission, resumption of care order placed, info on AVS. Patient has Home health aide from [facility] 3hrs/day x 7dys/wk.    Patient not Medically ready for discharge.  CM will continue to follow as patient progresses with care towards discharge.          Transition of Care Asessment: Insurance and Status: Insurance coverage has been reviewed Patient has primary care physician: Yes Home environment has been reviewed: Yes Prior level of function:: Bedbound Prior/Current Home Services: Current home services ([facility]) Social Drivers of Health Review: SDOH reviewed no interventions necessary Readmission risk has been reviewed: Yes Transition of care needs: transition of care needs identified, TOC will continue to follow

## 2023-06-05 NOTE — Plan of Care (Signed)
   Problem: Education: Goal: Ability to describe self-care measures that may prevent or decrease complications (Diabetes Survival Skills Education) will improve Outcome: Completed/Met

## 2023-06-06 ENCOUNTER — Encounter: Admitting: Physical Medicine & Rehabilitation

## 2023-06-06 DIAGNOSIS — K219 Gastro-esophageal reflux disease without esophagitis: Secondary | ICD-10-CM | POA: Diagnosis present

## 2023-06-06 DIAGNOSIS — R531 Weakness: Secondary | ICD-10-CM | POA: Diagnosis not present

## 2023-06-06 DIAGNOSIS — I5032 Chronic diastolic (congestive) heart failure: Secondary | ICD-10-CM | POA: Diagnosis present

## 2023-06-06 DIAGNOSIS — F32A Depression, unspecified: Secondary | ICD-10-CM | POA: Diagnosis present

## 2023-06-06 DIAGNOSIS — Z91041 Radiographic dye allergy status: Secondary | ICD-10-CM | POA: Diagnosis not present

## 2023-06-06 DIAGNOSIS — Z79899 Other long term (current) drug therapy: Secondary | ICD-10-CM | POA: Diagnosis not present

## 2023-06-06 DIAGNOSIS — Z86718 Personal history of other venous thrombosis and embolism: Secondary | ICD-10-CM | POA: Diagnosis not present

## 2023-06-06 DIAGNOSIS — R112 Nausea with vomiting, unspecified: Secondary | ICD-10-CM | POA: Diagnosis not present

## 2023-06-06 DIAGNOSIS — R404 Transient alteration of awareness: Secondary | ICD-10-CM | POA: Diagnosis not present

## 2023-06-06 DIAGNOSIS — E1143 Type 2 diabetes mellitus with diabetic autonomic (poly)neuropathy: Secondary | ICD-10-CM | POA: Diagnosis not present

## 2023-06-06 DIAGNOSIS — I69354 Hemiplegia and hemiparesis following cerebral infarction affecting left non-dominant side: Secondary | ICD-10-CM | POA: Diagnosis not present

## 2023-06-06 DIAGNOSIS — Z7902 Long term (current) use of antithrombotics/antiplatelets: Secondary | ICD-10-CM | POA: Diagnosis not present

## 2023-06-06 DIAGNOSIS — K3184 Gastroparesis: Secondary | ICD-10-CM | POA: Diagnosis present

## 2023-06-06 DIAGNOSIS — R1312 Dysphagia, oropharyngeal phase: Secondary | ICD-10-CM | POA: Diagnosis present

## 2023-06-06 DIAGNOSIS — J45909 Unspecified asthma, uncomplicated: Secondary | ICD-10-CM | POA: Diagnosis present

## 2023-06-06 DIAGNOSIS — E876 Hypokalemia: Secondary | ICD-10-CM | POA: Diagnosis present

## 2023-06-06 DIAGNOSIS — E669 Obesity, unspecified: Secondary | ICD-10-CM | POA: Diagnosis present

## 2023-06-06 DIAGNOSIS — R109 Unspecified abdominal pain: Secondary | ICD-10-CM | POA: Diagnosis not present

## 2023-06-06 DIAGNOSIS — Z7401 Bed confinement status: Secondary | ICD-10-CM | POA: Diagnosis not present

## 2023-06-06 DIAGNOSIS — Z7901 Long term (current) use of anticoagulants: Secondary | ICD-10-CM | POA: Diagnosis not present

## 2023-06-06 DIAGNOSIS — K5900 Constipation, unspecified: Secondary | ICD-10-CM | POA: Diagnosis present

## 2023-06-06 DIAGNOSIS — Z794 Long term (current) use of insulin: Secondary | ICD-10-CM | POA: Diagnosis not present

## 2023-06-06 DIAGNOSIS — I110 Hypertensive heart disease with heart failure: Secondary | ICD-10-CM | POA: Diagnosis present

## 2023-06-06 DIAGNOSIS — Z6832 Body mass index (BMI) 32.0-32.9, adult: Secondary | ICD-10-CM | POA: Diagnosis not present

## 2023-06-06 DIAGNOSIS — E1165 Type 2 diabetes mellitus with hyperglycemia: Secondary | ICD-10-CM | POA: Diagnosis present

## 2023-06-06 DIAGNOSIS — E0590 Thyrotoxicosis, unspecified without thyrotoxic crisis or storm: Secondary | ICD-10-CM | POA: Diagnosis present

## 2023-06-06 DIAGNOSIS — F419 Anxiety disorder, unspecified: Secondary | ICD-10-CM | POA: Diagnosis present

## 2023-06-06 DIAGNOSIS — Z452 Encounter for adjustment and management of vascular access device: Secondary | ICD-10-CM | POA: Diagnosis not present

## 2023-06-06 DIAGNOSIS — I2510 Atherosclerotic heart disease of native coronary artery without angina pectoris: Secondary | ICD-10-CM | POA: Diagnosis present

## 2023-06-06 DIAGNOSIS — G459 Transient cerebral ischemic attack, unspecified: Secondary | ICD-10-CM | POA: Diagnosis not present

## 2023-06-06 DIAGNOSIS — R1310 Dysphagia, unspecified: Secondary | ICD-10-CM | POA: Diagnosis not present

## 2023-06-06 DIAGNOSIS — E785 Hyperlipidemia, unspecified: Secondary | ICD-10-CM | POA: Diagnosis present

## 2023-06-06 LAB — GLUCOSE, CAPILLARY
Glucose-Capillary: 127 mg/dL — ABNORMAL HIGH (ref 70–99)
Glucose-Capillary: 120 mg/dL — ABNORMAL HIGH (ref 70–99)
Glucose-Capillary: 128 mg/dL — ABNORMAL HIGH (ref 70–99)
Glucose-Capillary: 216 mg/dL — ABNORMAL HIGH (ref 70–99)

## 2023-06-06 LAB — COMPREHENSIVE METABOLIC PANEL WITH GFR
ALT: 12 U/L (ref 0–44)
AST: 15 U/L (ref 15–41)
Albumin: 2.4 g/dL — ABNORMAL LOW (ref 3.5–5.0)
Alkaline Phosphatase: 53 U/L (ref 38–126)
Anion gap: 8 (ref 5–15)
BUN: 8 mg/dL (ref 6–20)
CO2: 23 mmol/L (ref 22–32)
Calcium: 8.4 mg/dL — ABNORMAL LOW (ref 8.9–10.3)
Chloride: 109 mmol/L (ref 98–111)
Creatinine, Ser: 1.02 mg/dL — ABNORMAL HIGH (ref 0.44–1.00)
GFR, Estimated: >60 mL/min (ref >60)
Glucose, Bld: 148 mg/dL — ABNORMAL HIGH (ref 70–99)
Potassium: 3.6 mmol/L (ref 3.5–5.1)
Sodium: 140 mmol/L (ref 135–145)
Total Bilirubin: 0.6 mg/dL (ref 0.0–1.2)
Total Protein: 5.3 g/dL — ABNORMAL LOW (ref 6.5–8.1)

## 2023-06-06 MED ORDER — METOCLOPRAMIDE HCL 5 MG/ML IJ SOLN
10.0000 mg | Freq: Four times a day (QID) | INTRAMUSCULAR | Status: DC
  Administered 2023-06-06 – 2023-06-10 (×16): 10 mg via INTRAVENOUS
  Filled 2023-06-06 (×15): qty 2

## 2023-06-06 NOTE — Plan of Care (Signed)
  Problem: Metabolic: Goal: Ability to maintain appropriate glucose levels will improve Outcome: Progressing   Problem: Nutritional: Goal: Maintenance of adequate nutrition will improve Outcome: Progressing   Problem: Skin Integrity: Goal: Risk for impaired skin integrity will decrease Outcome: Progressing   Problem: Clinical Measurements: Goal: Ability to maintain clinical measurements within normal limits will improve Outcome: Progressing Goal: Will remain free from infection Outcome: Progressing   Problem: Pain Managment: Goal: General experience of comfort will improve and/or be controlled Outcome: Progressing   Problem: Safety: Goal: Ability to remain free from injury will improve Outcome: Progressing   Problem: Skin Integrity: Goal: Risk for impaired skin integrity will decrease Outcome: Progressing

## 2023-06-06 NOTE — Progress Notes (Signed)
 Progress Note   Patient: Lori [name] [id number]:[id number] DOB: 07/23/1970 DOA: 06/02/2023     0 DOS: the patient was seen and examined on 06/06/2023   Brief hospital course: 53 year old married female, lives at home with her spouse, wheelchair mobile, PMH of hypertension, hyperlipidemia, GERD, diabetes, gastroparesis, CAD status post CABG, hyperthyroidism on methimazole , carotid artery disease, CVA, dysphagia, residual left hemiplegia with contractures, anemia, anxiety, depression, migraines, obesity, OSA, asthma, DVT, pheochromocytoma, recent hospitalization 4/24 - 4/28 for gastroparesis flare, went home for a few hours and then returned back to the ED on 4/28 with recurrent complaints of nausea, vomiting and abdominal pain.  Reports that she had about 4 episodes of nausea and vomiting, "orange-colored fluid", no coughing around emesis or frank blood.  Some abdominal pain, mostly right lower quadrant.  States that she had not had a BM for 4 days and then had a large BM prior to return to ED with improvement in her abdominal pain.  After pain meds in the ED, reports some itching without skin rash.  States that she has had "22 strokes".  Also indicates that her gastroparesis flares have been more frequent in the last 6 months.  Denies any complaints including any UTI symptoms.   ED Course: Afebrile, mild transient tachycardia up to the 120s-130s which has since resolved, initially high BP on arrival at 180/103 but since then soft blood pressures.  Not hypoxic. Lab work significant for potassium of 3.4, glucose 253, albumin  2.8, normal lipase, total protein 6.3, hemoglobin 11.1, WBC 11.3.  Urine pregnancy test negative.  UDS positive for opiates. In ED RU E PICC line was placed, patient has received IV fentanyl  x 1 IV Dilaudid  x 1 IV Ativan  x 1, IV Reglan  x 1, IV Zofran  x 1, 2 L IV fluid bolus.  Hospital admission requested.  Assessment and Plan: Suspected diabetic gastroparesis  flare/constipation Minimize opioids as much as able.  . -Would cont with bowel regimen as needed -remains on liquid diet -Remains symptomatic  -Chart reviewed. Pt well known to [facility]. Per last consult note, GI recommendation was for:  "Reglan  changed from 5 mg IV every 6 hours to 10 mg IV every 6 hours.  Will leave at this dose at it may be helping as she is doing better today. -Continue pantoprazole  40 mg orally daily. -Compazine  and zofran  are on her list prn. -If symptoms are not improving can consider the addition of a scopolamine patch 1.5 mg transdermally every 72 hours -Carafate  slurry 1 g p.o. 4 times daily for symptoms of throat discomfort/odynophagia was added 4/6 as well. -Will give a one time dose of two Senokot-S and will order Miralax  daily starting tomorrow.  Needs a good bowel regimen to prevent constipation from worsening her symptoms."  Will increase reglan  dose as tolerated per prior GI rec.    Hypokalemia Within normal limits   Poorly controlled type II DM A1c 10 on 04/25/2023.  For now since trying on liquid diet, treat with NovoLog  sensitive SSI.   -Anticipate resuming long-acting insulin  and adjust SSI as pt's PO intake improves   Essential hypertension Initially came in with hypertension but then intermittent soft blood pressures.  Continue home dose of metoprolol  50 Mg twice daily but held other meds to avoid hypotension   Hyperthyroidism TSH normal.  Continue home dose of methimazole .   GERD Continue IV PPI   Hyperlipidemia Continue statins.   History of QTc prolongation Repeated EKG and personally reviewed, sinus rhythm, no acute  changes and QTc 476 ms.   History of CVA with residual left hemiparesis Continue atorvastatin , Plavix  and also on Xarelto  for history of DVT   CAD s/p CABG Continue atorvastatin , beta-blocker & Plavix . Continue Ranexa    Anxiety and depression Continue Lexapro    History of DVT Continue home dose of Xarelto .  No GI  bleed or any bleeding noted  Chronic diastolic CHF -Cont metoprolol  -Benicar  on hold secondary to soft bp above -Will f/u daily wts and I/O -Lasix  currently on hold secondary to decreased PO intake      Subjective: [name] states that pt remains without appetite and not eating  Physical Exam: Vitals:   06/05/23 2104 06/06/23 0502 06/06/23 0900 06/06/23 0908  BP: 119/80 120/85 102/69 102/69  Pulse: 90 93 (!) 103 (!) 105  Resp:  19 14   Temp: 98.8 F (37.1 C) 97.8 F (36.6 C) 97.7 F (36.5 C) 97.7 F (36.5 C)  TempSrc: Oral Oral Oral   SpO2: 95% 94% 92% 92%  Weight:      Height:       General exam: Awake, laying in bed, in nad Respiratory system: Normal respiratory effort, no wheezing Cardiovascular system: regular rate, s1, s2 Gastrointestinal system: Soft, nondistended, positive BS Central nervous system: CN2-12 grossly intact, strength intact Extremities: Perfused, no clubbing Skin: Normal skin turgor, no notable skin lesions seen Psychiatry: difficult to assess given deficits  Data Reviewed:  Labs reviewed: Na 140, K 3.6, Cr 1.02  Family Communication: Pt in room, family not at bedside  Disposition: Status is: Observation The patient will require care spanning > 2 midnights and should be moved to inpatient because: severity of illness  Planned Discharge Destination: Home    Author: Cherylle Corwin, MD 06/06/2023 2:14 PM  For on call review [email or web address].

## 2023-06-06 NOTE — Plan of Care (Signed)
  Problem: Education: Goal: Individualized Educational Video(s) Outcome: Completed/Met

## 2023-06-07 ENCOUNTER — Inpatient Hospital Stay (HOSPITAL_COMMUNITY)

## 2023-06-07 DIAGNOSIS — E1143 Type 2 diabetes mellitus with diabetic autonomic (poly)neuropathy: Secondary | ICD-10-CM | POA: Diagnosis not present

## 2023-06-07 DIAGNOSIS — K3184 Gastroparesis: Secondary | ICD-10-CM | POA: Diagnosis not present

## 2023-06-07 DIAGNOSIS — R109 Unspecified abdominal pain: Secondary | ICD-10-CM | POA: Diagnosis not present

## 2023-06-07 DIAGNOSIS — R112 Nausea with vomiting, unspecified: Secondary | ICD-10-CM | POA: Diagnosis not present

## 2023-06-07 LAB — COMPREHENSIVE METABOLIC PANEL WITH GFR
ALT: 12 U/L (ref 0–44)
AST: 15 U/L (ref 15–41)
Albumin: 2.1 g/dL — ABNORMAL LOW (ref 3.5–5.0)
Alkaline Phosphatase: 54 U/L (ref 38–126)
Anion gap: 8 (ref 5–15)
BUN: 7 mg/dL (ref 6–20)
CO2: 22 mmol/L (ref 22–32)
Calcium: 7.9 mg/dL — ABNORMAL LOW (ref 8.9–10.3)
Chloride: 107 mmol/L (ref 98–111)
Creatinine, Ser: 0.88 mg/dL (ref 0.44–1.00)
GFR, Estimated: >60 mL/min (ref >60)
Glucose, Bld: 168 mg/dL — ABNORMAL HIGH (ref 70–99)
Potassium: 3.5 mmol/L (ref 3.5–5.1)
Sodium: 137 mmol/L (ref 135–145)
Total Bilirubin: 0.7 mg/dL (ref 0.0–1.2)
Total Protein: 5 g/dL — ABNORMAL LOW (ref 6.5–8.1)

## 2023-06-07 LAB — CBC
HCT: 28 % — ABNORMAL LOW (ref 36.0–46.0)
Hemoglobin: 9.1 g/dL — ABNORMAL LOW (ref 12.0–15.0)
MCH: 26.6 pg (ref 26.0–34.0)
MCHC: 32.5 g/dL (ref 30.0–36.0)
MCV: 81.9 fL (ref 80.0–100.0)
Platelets: 357 10*3/uL (ref 150–400)
RBC: 3.42 MIL/uL — ABNORMAL LOW (ref 3.87–5.11)
RDW: 15.9 % — ABNORMAL HIGH (ref 11.5–15.5)
WBC: 9.7 10*3/uL (ref 4.0–10.5)
nRBC: 0 % (ref 0.0–0.2)

## 2023-06-07 LAB — GLUCOSE, CAPILLARY
Glucose-Capillary: 158 mg/dL — ABNORMAL HIGH (ref 70–99)
Glucose-Capillary: 192 mg/dL — ABNORMAL HIGH (ref 70–99)
Glucose-Capillary: 170 mg/dL — ABNORMAL HIGH (ref 70–99)
Glucose-Capillary: 151 mg/dL — ABNORMAL HIGH (ref 70–99)

## 2023-06-07 MED ORDER — PANTOPRAZOLE SODIUM 40 MG PO TBEC
40.0000 mg | DELAYED_RELEASE_TABLET | Freq: Every day | ORAL | Status: DC
  Administered 2023-06-07 – 2023-06-10 (×4): 40 mg via ORAL
  Filled 2023-06-07 (×4): qty 1

## 2023-06-07 NOTE — Progress Notes (Signed)
 Progress Note   Patient: Lori [name] ZOX:[id number] DOB: 01/24/1971 DOA: 06/02/2023     1 DOS: the patient was seen and examined on 06/07/2023   Brief hospital course: 53 year old married female, lives at home with her spouse, wheelchair mobile, PMH of hypertension, hyperlipidemia, GERD, diabetes, gastroparesis, CAD status post CABG, hyperthyroidism on methimazole , carotid artery disease, CVA, dysphagia, residual left hemiplegia with contractures, anemia, anxiety, depression, migraines, obesity, OSA, asthma, DVT, pheochromocytoma, recent hospitalization 4/24 - 4/28 for gastroparesis flare, went home for a few hours and then returned back to the ED on 4/28 with recurrent complaints of nausea, vomiting and abdominal pain.  Reports that she had about 4 episodes of nausea and vomiting, "orange-colored fluid", no coughing around emesis or frank blood.  Some abdominal pain, mostly right lower quadrant.  States that she had not had a BM for 4 days and then had a large BM prior to return to ED with improvement in her abdominal pain.  After pain meds in the ED, reports some itching without skin rash.  States that she has had "22 strokes".  Also indicates that her gastroparesis flares have been more frequent in the last 6 months.  Denies any complaints including any UTI symptoms.   ED Course: Afebrile, mild transient tachycardia up to the 120s-130s which has since resolved, initially high BP on arrival at 180/103 but since then soft blood pressures.  Not hypoxic. Lab work significant for potassium of 3.4, glucose 253, albumin  2.8, normal lipase, total protein 6.3, hemoglobin 11.1, WBC 11.3.  Urine pregnancy test negative.  UDS positive for opiates. In ED RU E PICC line was placed, patient has received IV fentanyl  x 1 IV Dilaudid  x 1 IV Ativan  x 1, IV Reglan  x 1, IV Zofran  x 1, 2 L IV fluid bolus.  Hospital admission requested.  Assessment and Plan: Suspected diabetic gastroparesis  flare/constipation Minimize opioids as much as able.  . -Would cont with bowel regimen as needed -remains on liquid diet -Remains symptomatic  -Chart reviewed. Pt well known to [facility]. Per last consult note, GI recommendation was for:  "Reglan  changed from 5 mg IV every 6 hours to 10 mg IV every 6 hours.  Will leave at this dose at it may be helping as she is doing better today. -Continue pantoprazole  40 mg orally daily. -Compazine  and zofran  are on her list prn. -If symptoms are not improving can consider the addition of a scopolamine patch 1.5 mg transdermally every 72 hours -Carafate  slurry 1 g p.o. 4 times daily for symptoms of throat discomfort/odynophagia was added 4/6 as well. -Will give a one time dose of two Senokot-S and will order Miralax  daily starting tomorrow.  Needs a good bowel regimen to prevent constipation from worsening her symptoms."  Clinically improved with increased dose of reglan     Hypokalemia Within normal limits   Poorly controlled type II DM A1c 10 on 04/25/2023.  For now since trying on liquid diet, treat with NovoLog  sensitive SSI.   -will resume long-acting insulin  as pt's diet is now being advanced   Essential hypertension Initially came in with hypertension but then intermittent soft blood pressures.  Continue home dose of metoprolol  50 Mg twice daily but held other meds to avoid hypotension   Hyperthyroidism TSH normal.  Continue home dose of methimazole .   GERD Continue IV PPI   Hyperlipidemia Continue statins.   History of QTc prolongation Repeated EKG and personally reviewed, sinus rhythm, no acute changes and QTc 476  ms.   History of CVA with residual left hemiparesis Continue atorvastatin , Plavix  and also on Xarelto  for history of DVT   CAD s/p CABG Continue atorvastatin , beta-blocker & Plavix . Continue Ranexa    Anxiety and depression Continue Lexapro    History of DVT Continue home dose of Xarelto .  No GI bleed or any bleeding  noted  Chronic diastolic CHF -Cont metoprolol  -Benicar  on hold secondary to soft bp above -Will f/u daily wts and I/O -Lasix  currently on hold secondary to decreased PO intake      Subjective: Feeling better. Wants to eat spaghetti   Physical Exam: Vitals:   06/06/23 1920 06/07/23 0512 06/07/23 0833 06/07/23 1650  BP: 118/77 119/67 (!) 134/95 (!) 136/94  Pulse:  84 88 (!) 108  Resp: 18 19 18    Temp: 99.1 F (37.3 C) 99.3 F (37.4 C) 98.3 F (36.8 C) 98.1 F (36.7 C)  TempSrc: Oral Oral  Oral  SpO2: 98% 98% 98% 99%  Weight:      Height:       General exam: Conversant, in no acute distress Respiratory system: normal chest rise, clear, no audible wheezing Cardiovascular system: regular rhythm, s1-s2 Gastrointestinal system: Nondistended, nontender, pos BS Central nervous system: No seizures, no tremors Extremities: No cyanosis, no joint deformities Skin: No rashes, no pallor Psychiatry: Affect normal // no auditory hallucinations   Data Reviewed:  Labs reviewed: Na 137, K 3.5, Cr 0.88, WBC 9.7, Hgb 9.1  Family Communication: Pt in room, family not at bedside  Disposition: Status is: Observation The patient will require care spanning > 2 midnights and should be moved to inpatient because: severity of illness  Planned Discharge Destination: Home    Author: Cherylle Corwin, MD 06/07/2023 5:47 PM  For on call review [email or web address].

## 2023-06-08 DIAGNOSIS — K3184 Gastroparesis: Secondary | ICD-10-CM | POA: Diagnosis not present

## 2023-06-08 DIAGNOSIS — E1143 Type 2 diabetes mellitus with diabetic autonomic (poly)neuropathy: Secondary | ICD-10-CM | POA: Diagnosis not present

## 2023-06-08 DIAGNOSIS — R109 Unspecified abdominal pain: Secondary | ICD-10-CM | POA: Diagnosis not present

## 2023-06-08 DIAGNOSIS — R112 Nausea with vomiting, unspecified: Secondary | ICD-10-CM | POA: Diagnosis not present

## 2023-06-08 LAB — COMPREHENSIVE METABOLIC PANEL WITH GFR
ALT: 11 U/L (ref 0–44)
AST: 13 U/L — ABNORMAL LOW (ref 15–41)
Albumin: 2.3 g/dL — ABNORMAL LOW (ref 3.5–5.0)
Alkaline Phosphatase: 58 U/L (ref 38–126)
Anion gap: 8 (ref 5–15)
BUN: <5 mg/dL — ABNORMAL LOW (ref 6–20)
CO2: 21 mmol/L — ABNORMAL LOW (ref 22–32)
Calcium: 8.1 mg/dL — ABNORMAL LOW (ref 8.9–10.3)
Chloride: 106 mmol/L (ref 98–111)
Creatinine, Ser: 0.8 mg/dL (ref 0.44–1.00)
GFR, Estimated: >60 mL/min (ref >60)
Glucose, Bld: 144 mg/dL — ABNORMAL HIGH (ref 70–99)
Potassium: 3.1 mmol/L — ABNORMAL LOW (ref 3.5–5.1)
Sodium: 135 mmol/L (ref 135–145)
Total Bilirubin: 0.4 mg/dL (ref 0.0–1.2)
Total Protein: 5.3 g/dL — ABNORMAL LOW (ref 6.5–8.1)

## 2023-06-08 LAB — GLUCOSE, CAPILLARY
Glucose-Capillary: 169 mg/dL — ABNORMAL HIGH (ref 70–99)
Glucose-Capillary: 226 mg/dL — ABNORMAL HIGH (ref 70–99)
Glucose-Capillary: 161 mg/dL — ABNORMAL HIGH (ref 70–99)
Glucose-Capillary: 118 mg/dL — ABNORMAL HIGH (ref 70–99)

## 2023-06-08 LAB — CBC
HCT: 29.1 % — ABNORMAL LOW (ref 36.0–46.0)
Hemoglobin: 9.7 g/dL — ABNORMAL LOW (ref 12.0–15.0)
MCH: 26.9 pg (ref 26.0–34.0)
MCHC: 33.3 g/dL (ref 30.0–36.0)
MCV: 80.6 fL (ref 80.0–100.0)
Platelets: 392 10*3/uL (ref 150–400)
RBC: 3.61 MIL/uL — ABNORMAL LOW (ref 3.87–5.11)
RDW: 15.5 % (ref 11.5–15.5)
WBC: 8.1 10*3/uL (ref 4.0–10.5)
nRBC: 0 % (ref 0.0–0.2)

## 2023-06-08 MED ORDER — ENSURE ENLIVE PO LIQD
237.0000 mL | Freq: Two times a day (BID) | ORAL | Status: DC
  Administered 2023-06-08 – 2023-06-09 (×3): 237 mL via ORAL

## 2023-06-08 MED ORDER — POTASSIUM CHLORIDE CRYS ER 20 MEQ PO TBCR
40.0000 meq | EXTENDED_RELEASE_TABLET | ORAL | Status: AC
  Administered 2023-06-08 (×2): 40 meq via ORAL
  Filled 2023-06-08 (×2): qty 2

## 2023-06-08 NOTE — Progress Notes (Signed)
 Progress Note   Patient: Lori [name] GNF:[id number] DOB: Jul 04, 1970 DOA: 06/02/2023     2 DOS: the patient was seen and examined on 06/08/2023   Brief hospital course: 53 year old married female, lives at home with her spouse, wheelchair mobile, PMH of hypertension, hyperlipidemia, GERD, diabetes, gastroparesis, CAD status post CABG, hyperthyroidism on methimazole , carotid artery disease, CVA, dysphagia, residual left hemiplegia with contractures, anemia, anxiety, depression, migraines, obesity, OSA, asthma, DVT, pheochromocytoma, recent hospitalization 4/24 - 4/28 for gastroparesis flare, went home for a few hours and then returned back to the ED on 4/28 with recurrent complaints of nausea, vomiting and abdominal pain.  Reports that she had about 4 episodes of nausea and vomiting, "orange-colored fluid", no coughing around emesis or frank blood.  Some abdominal pain, mostly right lower quadrant.  States that she had not had a BM for 4 days and then had a large BM prior to return to ED with improvement in her abdominal pain.  After pain meds in the ED, reports some itching without skin rash.  States that she has had "22 strokes".  Also indicates that her gastroparesis flares have been more frequent in the last 6 months.  Denies any complaints including any UTI symptoms.   ED Course: Afebrile, mild transient tachycardia up to the 120s-130s which has since resolved, initially high BP on arrival at 180/103 but since then soft blood pressures.  Not hypoxic. Lab work significant for potassium of 3.4, glucose 253, albumin  2.8, normal lipase, total protein 6.3, hemoglobin 11.1, WBC 11.3.  Urine pregnancy test negative.  UDS positive for opiates. In ED RU E PICC line was placed, patient has received IV fentanyl  x 1 IV Dilaudid  x 1 IV Ativan  x 1, IV Reglan  x 1, IV Zofran  x 1, 2 L IV fluid bolus.  Hospital admission requested.  Assessment and Plan: Suspected diabetic gastroparesis  flare/constipation Minimize opioids as much as able.  . -Would cont with bowel regimen as needed -remains on liquid diet -Remains symptomatic  -Chart reviewed. Pt well known to [facility]. Per last consult note, GI recommendation was for:  "Reglan  changed from 5 mg IV every 6 hours to 10 mg IV every 6 hours.  Will leave at this dose at it may be helping as she is doing better today. -Continue pantoprazole  40 mg orally daily. -Compazine  and zofran  are on her list prn. -If symptoms are not improving can consider the addition of a scopolamine patch 1.5 mg transdermally every 72 hours -Carafate  slurry 1 g p.o. 4 times daily for symptoms of throat discomfort/odynophagia was added 4/6 as well. -Will give a one time dose of two Senokot-S and will order Miralax  daily starting tomorrow.  Needs a good bowel regimen to prevent constipation from worsening her symptoms."  Clinically improved with increased dose of reglan .  -Had episode of choking this afternoon. Will change diet to dys3 and consult SLP   Hypokalemia Within normal limits   Poorly controlled type II DM A1c 10 on 04/25/2023.  For now since trying on liquid diet, treat with NovoLog  sensitive SSI.   -will resume long-acting insulin  as pt's diet is now being advanced   Essential hypertension Initially came in with hypertension but then intermittent soft blood pressures.  Continue home dose of metoprolol  50 Mg twice daily but held other meds to avoid hypotension   Hyperthyroidism TSH normal.  Continue home dose of methimazole .   GERD Continue IV PPI   Hyperlipidemia Continue statins.   History of QTc  prolongation Repeated EKG and personally reviewed, sinus rhythm, no acute changes and QTc 476 ms.   History of CVA with residual left hemiparesis Continue atorvastatin , Plavix  and also on Xarelto  for history of DVT   CAD s/p CABG Continue atorvastatin , beta-blocker & Plavix . Continue Ranexa    Anxiety and depression Continue  Lexapro    History of DVT Continue home dose of Xarelto .  No GI bleed or any bleeding noted  Chronic diastolic CHF -Cont metoprolol  -Benicar  on hold secondary to soft bp above -Will f/u daily wts and I/O -Lasix  currently on hold secondary to decreased PO intake -Wt remains stable. May resume lasix  soon as pt advances diet      Subjective: Reports feeling somewhat better  Physical Exam: Vitals:   06/08/23 0453 06/08/23 0711 06/08/23 0822 06/08/23 1703  BP: (!) 153/90  (!) 154/88 (!) 144/98  Pulse: 84  96 90  Resp: 17  18 18   Temp: 98.2 F (36.8 C)  98.1 F (36.7 C) 98.2 F (36.8 C)  TempSrc: Oral  Oral   SpO2: 99%  99% 100%  Weight:  80.8 kg    Height:       General exam: Awake, laying in bed, in nad Respiratory system: Normal respiratory effort, no wheezing Cardiovascular system: regular rate, s1, s2 Gastrointestinal system: Soft, nondistended, decreased BS Central nervous system: CN2-12 grossly intact, strength intact Extremities: Perfused, no clubbing Skin: Normal skin turgor, no notable skin lesions seen Psychiatry: difficult to assess given deficits  Data Reviewed:  Labs reviewed: Na 135, K 3.1, Cr 0.80, WBC 8.1, Hgb 9.7  Family Communication: Pt in room, family not at bedside  Disposition: Status is: Inpatient Continue inpatient stay because: severity of illness  Planned Discharge Destination: Home    Author: Cherylle Corwin, MD 06/08/2023 6:11 PM  For on call review [email or web address].

## 2023-06-08 NOTE — Progress Notes (Signed)
 Initial Nutrition Assessment  DOCUMENTATION CODES:   Obesity unspecified  INTERVENTION:  MVI with minerals  Ensure Enlive po BID, each supplement provides 350 kcal and 20 grams of protein.  Continue current diet as ordered. Encourage PO intake.   NUTRITION DIAGNOSIS:   Inadequate oral intake related to altered GI function as evidenced by meal completion < 50% (Nausea/vomiting/abd pain).  GOAL:   Patient will meet greater than or equal to 90% of their needs  MONITOR:   PO intake, Supplement acceptance, Diet advancement  REASON FOR ASSESSMENT:   Consult Assessment of nutrition requirement/status  ASSESSMENT:   PMH hypertension, hyperlipidemia, GERD, diabetes, gastroparesis, CAD status post CABG, hyperthyroidism on methimazole , carotid artery disease, CVA, dysphagia, residual left hemiplegia with contractures, anemia, anxiety, depression, migraines, obesity, OSA, asthma, DVT, pheochromocytoma, recent hospitalization 4/24 - 4/28 for gastroparesis flare, went home for a few hours and then returned back to the ED on 4/28 with recurrent complaints of nausea, vomiting and abdominal pain. Adm for diabetic gastroparesis.  RD working remotely and unable to reach pt directly by room phone. Chart notes reveal pt's appetite has improved slightly and requested spagetti.   Intake: Nursing flowsheets document meal completions between 40-50%.  Medications reviewed and include: plavix , SSI 0-9 units, reglan , protonix , senna, flomax    Labs reviewed: Potasium 3.1 L, CBGs 151-216 x24h, A1c 10.0%   Intake/Output Summary (Last 24 hours) at 06/08/2023 1623 Last data filed at 06/08/2023 0535 Gross per 24 hour  Intake 120 ml  Output 1350 ml  Net -1230 ml    NUTRITION - FOCUSED PHYSICAL EXAM:  Defer to f/u  Diet Order:   Diet Order             DIET SOFT Room service appropriate? Yes; Fluid consistency: Thin  Diet effective now                   EDUCATION NEEDS:   No education  needs have been identified at this time  Skin:  Skin Assessment: Reviewed RN Assessment  Last BM:  5/3  Height:   Ht Readings from Last 1 Encounters:  06/03/23 5\' 2"  (1.575 m)    Weight:   Wt Readings from Last 1 Encounters:  06/08/23 80.8 kg    BMI:  Body mass index is 32.58 kg/m.  Estimated Nutritional Needs:   Kcal:  1500-1700  Protein:  75-95  Fluid:  >1.5 L or per MD  Laren Player, MPH, RD, LDN Clinical Dietitian Contact information can be found at [facility].

## 2023-06-09 DIAGNOSIS — K3184 Gastroparesis: Secondary | ICD-10-CM | POA: Diagnosis not present

## 2023-06-09 DIAGNOSIS — E1143 Type 2 diabetes mellitus with diabetic autonomic (poly)neuropathy: Secondary | ICD-10-CM | POA: Diagnosis not present

## 2023-06-09 LAB — GLUCOSE, CAPILLARY
Glucose-Capillary: 131 mg/dL — ABNORMAL HIGH (ref 70–99)
Glucose-Capillary: 180 mg/dL — ABNORMAL HIGH (ref 70–99)
Glucose-Capillary: 202 mg/dL — ABNORMAL HIGH (ref 70–99)
Glucose-Capillary: 208 mg/dL — ABNORMAL HIGH (ref 70–99)

## 2023-06-09 LAB — COMPREHENSIVE METABOLIC PANEL WITH GFR
ALT: 11 U/L (ref 0–44)
AST: 11 U/L — ABNORMAL LOW (ref 15–41)
Albumin: 2.4 g/dL — ABNORMAL LOW (ref 3.5–5.0)
Alkaline Phosphatase: 57 U/L (ref 38–126)
Anion gap: 12 (ref 5–15)
BUN: <5 mg/dL — ABNORMAL LOW (ref 6–20)
CO2: 21 mmol/L — ABNORMAL LOW (ref 22–32)
Calcium: 8.8 mg/dL — ABNORMAL LOW (ref 8.9–10.3)
Chloride: 108 mmol/L (ref 98–111)
Creatinine, Ser: 0.84 mg/dL (ref 0.44–1.00)
GFR, Estimated: >60 mL/min (ref >60)
Glucose, Bld: 113 mg/dL — ABNORMAL HIGH (ref 70–99)
Potassium: 3.9 mmol/L (ref 3.5–5.1)
Sodium: 141 mmol/L (ref 135–145)
Total Bilirubin: 0.5 mg/dL (ref 0.0–1.2)
Total Protein: 5.5 g/dL — ABNORMAL LOW (ref 6.5–8.1)

## 2023-06-09 LAB — CBC
HCT: 29.9 % — ABNORMAL LOW (ref 36.0–46.0)
Hemoglobin: 9.9 g/dL — ABNORMAL LOW (ref 12.0–15.0)
MCH: 26.8 pg (ref 26.0–34.0)
MCHC: 33.1 g/dL (ref 30.0–36.0)
MCV: 80.8 fL (ref 80.0–100.0)
Platelets: 426 10*3/uL — ABNORMAL HIGH (ref 150–400)
RBC: 3.7 MIL/uL — ABNORMAL LOW (ref 3.87–5.11)
RDW: 15.5 % (ref 11.5–15.5)
WBC: 7.8 10*3/uL (ref 4.0–10.5)
nRBC: 0 % (ref 0.0–0.2)

## 2023-06-09 NOTE — Plan of Care (Signed)
  Problem: Metabolic: Goal: Ability to maintain appropriate glucose levels will improve Outcome: Progressing   Problem: Nutritional: Goal: Maintenance of adequate nutrition will improve Outcome: Progressing   Problem: Skin Integrity: Goal: Risk for impaired skin integrity will decrease Outcome: Progressing   Problem: Clinical Measurements: Goal: Ability to maintain clinical measurements within normal limits will improve Outcome: Progressing Goal: Will remain free from infection Outcome: Progressing   Problem: Pain Managment: Goal: General experience of comfort will improve and/or be controlled Outcome: Progressing   Problem: Safety: Goal: Ability to remain free from injury will improve Outcome: Progressing   Problem: Skin Integrity: Goal: Risk for impaired skin integrity will decrease Outcome: Progressing

## 2023-06-09 NOTE — Evaluation (Signed)
 Physical Therapy Evaluation Patient Details Name: Lori [name] MRN: [id number] DOB: [birth year] Today's Date: 06/09/2023  History of Present Illness  53 y.o. female presents to [facility] hospital on 06/02/2023 after discharging earlier in the day with symptoms of nausea, vomiting and abdominal pain. Pt was recently hospitalized 4/24-4/28 for a gastroparesis flare. PMH includes HTN, HLD, GERD, DM, CABG, CVA with residual L hemiplegia, anxiety/depression, migraines, ISA, asthma, DVT.  Clinical Impression  Pt presents to PT with deficits in functional mobility, balance, strength, power, endurance. 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 At baseline pt requires physical assistance for all functional mobility, but she is able to transfer from bed to recliner with assistance of her aide. Pt demonstrates the ability to perform bed mobility and transfer into standing on this date, with a R lateral lean in sitting which is not present during standing. Pt will benefit from continued acute PT services in an effort to maintain the pt's current strength and mobility level. PT recommends HHPT at the time of discharge to continue improving upon the pt's strength, balance, and transfer quality.        If plan is discharge home, recommend the following: A lot of help with walking and/or transfers;A lot of help with bathing/dressing/bathroom;Assistance with cooking/housework;Assist for transportation;Help with stairs or ramp for entrance;Supervision due to cognitive status   Can travel by private vehicle   No    Equipment Recommendations None recommended by PT  Recommendations for Other Services       Functional Status Assessment Patient has had a recent decline in their functional status and demonstrates the ability to make significant improvements in function in a reasonable and predictable amount of time.     Precautions / Restrictions Precautions Precautions: Fall Recall of  Precautions/Restrictions: Intact Precaution/Restrictions Comments: chronic L spastic hemiplegia Restrictions Weight Bearing Restrictions Per Provider Order: No      Mobility  Bed Mobility Overal bed mobility: Needs Assistance Bed Mobility: Supine to Sit, Sit to Supine     Supine to sit: Max assist, HOB elevated, Used rails Sit to supine: Total assist        Transfers Overall transfer level: Needs assistance Equipment used: 1 person hand held assist Transfers: Sit to/from Stand Sit to Stand: Mod assist           General transfer comment: face to face assist of PT, cues for anterior weight shift and hand positioning. PT declines transfer to recliner    Ambulation/Gait                  Stairs            Wheelchair Mobility     Tilt Bed    Modified Rankin (Stroke Patients Only)       Balance Overall balance assessment: Needs assistance Sitting-balance support: Single extremity supported, Feet supported Sitting balance-Leahy Scale: Poor Sitting balance - Comments: minA with R lateral lean initially, improves to CGA although still with slight lateral lean to R Postural control: Right lateral lean Standing balance support: Single extremity supported Standing balance-Leahy Scale: Poor Standing balance comment: minA                             Pertinent Vitals/Pain Pain Assessment Pain Assessment: Faces Faces Pain Scale: Hurts even more Pain Location: pt reports pain with attempted mobility of LUE Pain Descriptors / Indicators: Grimacing Pain Intervention(s): Monitored during session    Home  Living Family/patient expects to be discharged to:: Private residence Living Arrangements: Spouse/significant other Available Help at Discharge: Family;Available PRN/intermittently;Personal care attendant Type of Home: Apartment Home Access: Level entry       Home Layout: One level Home Equipment: Hospital bed;Wheelchair - manual;Tub  bench      Prior Function Prior Level of Function : Needs assist             Mobility Comments: pt performs stand pivot transfers from bed to wheelchair with assistance of her aide. Pt requires assistance for bed mobility at baseline. Pt is dependent for wheelchair mobility. ADLs Comments: Pt reports performing self-feeding, otherwise requires assistance for all ADLs     Extremity/Trunk Assessment   Upper Extremity Assessment Upper Extremity Assessment: LUE deficits/detail RUE Deficits / Details: ROM WFL, grip strength is functional, formal MMT not performed at this time as pt with PICC line in R arm LUE Deficits / Details: pt defers ROM assessment. PT notes pt is contracted in decorticate positioning, minimal AROM noted at shoulder and elbow joint.    Lower Extremity Assessment Lower Extremity Assessment: RLE deficits/detail;LLE deficits/detail RLE Deficits / Details: ROM WFL, grossly 4/5 LLE Deficits / Details: PROM WFL, pt lacking active ankle dorsiflexion, PT notes extensor tone at L knee. ankle PF/DF 3/5, knee flexion 3-/5, knee extension 4-/5    Cervical / Trunk Assessment Cervical / Trunk Assessment: Kyphotic  Communication   Communication Communication: Impaired Factors Affecting Communication: Reduced clarity of speech    Cognition Arousal: Alert Behavior During Therapy: WFL for tasks assessed/performed   PT - Cognitive impairments: Problem solving                       PT - Cognition Comments: increased time to respond at times Following commands: Intact Following commands impaired: Only follows one step commands consistently     Cueing Cueing Techniques: Verbal cues, Gestural cues     General Comments General comments (skin integrity, edema, etc.): VSS on RA, pt in NAD    Exercises     Assessment/Plan    PT Assessment Patient needs continued PT services  PT Problem List Decreased strength;Decreased range of motion;Decreased activity  tolerance;Decreased balance;Decreased mobility;Decreased knowledge of use of DME;Impaired tone;Pain       PT Treatment Interventions DME instruction;Functional mobility training;Therapeutic activities;Therapeutic exercise;Balance training;Neuromuscular re-education;Cognitive remediation;Patient/family education    PT Goals (Current goals can be found in the Care Plan section)  Acute Rehab PT Goals Patient Stated Goal: to return home PT Goal Formulation: With patient Time For Goal Achievement: 06/23/23 Potential to Achieve Goals: Fair    Frequency Min 1X/week     Co-evaluation               AM-PAC PT "6 Clicks" Mobility  Outcome Measure Help needed turning from your back to your side while in a flat bed without using bedrails?: A Lot Help needed moving from lying on your back to sitting on the side of a flat bed without using bedrails?: A Lot Help needed moving to and from a bed to a chair (including a wheelchair)?: A Lot Help needed standing up from a chair using your arms (e.g., wheelchair or bedside chair)?: A Lot Help needed to walk in hospital room?: Total Help needed climbing 3-5 steps with a railing? : Total 6 Click Score: 10    End of Session   Activity Tolerance: Patient tolerated treatment well Patient left: in bed;with call bell/phone within reach;with bed alarm set  Nurse Communication: Mobility status PT Visit Diagnosis: Other abnormalities of gait and mobility (R26.89);Muscle weakness (generalized) (M62.81) Hemiplegia - Right/Left: Left Hemiplegia - caused by: Cerebral infarction (chronic)    Time: 1610-9604 PT Time Calculation (min) (ACUTE ONLY): 18 min   Charges:   PT Evaluation $PT Eval Low Complexity: 1 Low   PT General Charges $$ ACUTE PT VISIT: 1 Visit         Rexie Catena, PT, DPT Acute Rehabilitation Office [phone]   Rexie Catena 06/09/2023, 5:37 PM

## 2023-06-09 NOTE — Progress Notes (Signed)
 Progress Note   Patient: Lori [name] [id number]:[id number] DOB: 11/19/1970 DOA: 06/02/2023     3 DOS: the patient was seen and examined on 06/09/2023   Brief hospital course: 53 year old married female, lives at home with her spouse, wheelchair mobile, PMH of hypertension, hyperlipidemia, GERD, diabetes, gastroparesis, CAD status post CABG, hyperthyroidism on methimazole , carotid artery disease, CVA, dysphagia, residual left hemiplegia with contractures, anemia, anxiety, depression, migraines, obesity, OSA, asthma, DVT, pheochromocytoma, recent hospitalization 4/24 - 4/28 for gastroparesis flare, went home for a few hours and then returned back to the ED on 4/28 with recurrent complaints of nausea, vomiting and abdominal pain.  Reports that she had about 4 episodes of nausea and vomiting, "orange-colored fluid", no coughing around emesis or frank blood.  Some abdominal pain, mostly right lower quadrant.  States that she had not had a BM for 4 days and then had a large BM prior to return to ED with improvement in her abdominal pain.  After pain meds in the ED, reports some itching without skin rash.  States that she has had "22 strokes".  Also indicates that her gastroparesis flares have been more frequent in the last 6 months.  Denies any complaints including any UTI symptoms.   ED Course: Afebrile, mild transient tachycardia up to the 120s-130s which has since resolved, initially high BP on arrival at 180/103 but since then soft blood pressures.  Not hypoxic. Lab work significant for potassium of 3.4, glucose 253, albumin  2.8, normal lipase, total protein 6.3, hemoglobin 11.1, WBC 11.3.  Urine pregnancy test negative.  UDS positive for opiates. In ED RU E PICC line was placed, patient has received IV fentanyl  x 1 IV Dilaudid  x 1 IV Ativan  x 1, IV Reglan  x 1, IV Zofran  x 1, 2 L IV fluid bolus.  Hospital admission requested.  Assessment and Plan: Suspected diabetic gastroparesis  flare/constipation Minimize opioids as much as able.  . -Would cont with bowel regimen as needed -remains on liquid diet -Remains symptomatic  -Chart reviewed. Pt well known to [facility]. Per last consult note, GI recommendation was for:  "Reglan  changed from 5 mg IV every 6 hours to 10 mg IV every 6 hours.  Will leave at this dose at it may be helping as she is doing better today. -Continue pantoprazole  40 mg orally daily. -Compazine  and zofran  are on her list prn. -If symptoms are not improving can consider the addition of a scopolamine patch 1.5 mg transdermally every 72 hours -Carafate  slurry 1 g p.o. 4 times daily for symptoms of throat discomfort/odynophagia was added 4/6 as well. -Will give a one time dose of two Senokot-S and will order Miralax  daily starting tomorrow.  Needs a good bowel regimen to prevent constipation from worsening her symptoms."  -Now clinically much improved with 10mg  reglan  q6h. Cont dys3 and f/u on SLP   Hypokalemia Within normal limits   Poorly controlled type II DM A1c 10 on 04/25/2023.  For now since trying on liquid diet, treat with NovoLog  sensitive SSI.   -will resume long-acting insulin  as pt's diet is now being advanced   Essential hypertension Initially came in with hypertension but then intermittent soft blood pressures.  Continue home dose of metoprolol  50 Mg twice daily but held other meds to avoid hypotension   Hyperthyroidism TSH normal.  Continue home dose of methimazole .   GERD Continue IV PPI   Hyperlipidemia Continue statins.   History of QTc prolongation Repeated EKG and personally reviewed, sinus rhythm,  no acute changes and QTc 476 ms.   History of CVA with residual left hemiparesis Continue atorvastatin , Plavix  and also on Xarelto  for history of DVT   CAD s/p CABG Continue atorvastatin , beta-blocker & Plavix . Continue Ranexa    Anxiety and depression Continue Lexapro    History of DVT Continue home dose of Xarelto .   No GI bleed or any bleeding noted  Chronic diastolic CHF -Cont metoprolol  -Benicar  on hold secondary to soft bp above -Will f/u daily wts and I/O -Lasix  currently on hold secondary to decreased PO intake -Wt remains stable. May resume lasix  soon as pt advances diet      Subjective: Feels much better today. Family at bedside agrees pt appears much improved  Physical Exam: Vitals:   06/08/23 2150 06/08/23 2154 06/09/23 0500 06/09/23 0958  BP: (!) 160/101 (!) 160/101  (!) 148/89  Pulse: 87 87  89  Resp:      Temp:    98.3 F (36.8 C)  TempSrc:    Oral  SpO2:  100%  100%  Weight:   81 kg   Height:       General exam: Conversant, in no acute distress Respiratory system: normal chest rise, clear, no audible wheezing Cardiovascular system: regular rhythm, s1-s2 Gastrointestinal system: Nondistended, nontender, pos BS Central nervous system: No seizures, no tremors Extremities: No cyanosis, no joint deformities Skin: No rashes, no pallor Psychiatry: difficult to assess given deficits  Data Reviewed:  Labs reviewed: Na 141, K 3.9, Cr 0.84, WBC 7.8, Hgb 9.9  Family Communication: Pt in room, family not at bedside  Disposition: Status is: Inpatient Continue inpatient stay because: severity of illness  Planned Discharge Destination: Home    Author: Cherylle Corwin, MD 06/09/2023 4:28 PM  For on call review [email or web address].

## 2023-06-09 NOTE — TOC Progression Note (Signed)
 Transition of Care [facility]) - Progression Note    Patient Details  Name: Lori [name] MRN: [id number] Date of Birth: Feb 09, 1970  [facility]) CM/SW Contact  Tom-Johnson, Jong Rickman Daphne, RN Phone Number: 06/09/2023, 3:51 PM  Clinical Narrative:     Patient continues IV Reglan  for symptom management. Speech consulted today for Swallow eval, patient on Dysphagia 3 diet.   Patient not Medically ready for discharge.  CM will continue to follow as patient progresses with care towards discharge.           Expected Discharge Plan and Services                                               Social Determinants of Health (SDOH) Interventions SDOH Screenings   Food Insecurity: No Food Insecurity (06/03/2023)  Housing: Low Risk  (06/03/2023)  Transportation Needs: No Transportation Needs (06/03/2023)  Utilities: Not At Risk (06/03/2023)  Depression (PHQ2-9): Low Risk  (05/15/2023)  Tobacco Use: Low Risk  (06/02/2023)    Readmission Risk Interventions    05/13/2023    3:06 PM 05/01/2023    3:01 PM 12/28/2022   11:30 AM  Readmission Risk Prevention Plan  Transportation Screening Complete Complete Complete  Medication Review [employer]) Complete Complete Complete  PCP or Specialist appointment within 3-5 days of discharge Complete Complete Complete  HRI or Home Care Consult Complete Complete Complete  SW Recovery Care/Counseling Consult Complete Complete Complete  Palliative Care Screening Not Applicable Not Applicable Not Applicable  Skilled Nursing Facility Patient Refused Patient Refused Not Applicable

## 2023-06-09 NOTE — Evaluation (Signed)
 Clinical/Bedside Swallow Evaluation Patient Details  Name: Lori [name] MRN: [id number] Date of Birth: [birth year]  Today's Date: 06/09/2023 Time: SLP Start Time (ACUTE ONLY): 0955 SLP Stop Time (ACUTE ONLY): 1015 SLP Time Calculation (min) (ACUTE ONLY): 20 min  Past Medical History:  Past Medical History:  Diagnosis Date   Acute bronchitis 08/24/2021   Acute cystitis 12/26/2022   Acute metabolic encephalopathy 11/04/2022   Acute renal failure (ARF) (HCC) 04/27/2023   AKI (acute kidney injury) (HCC) 04/30/2022   Aspiration pneumonia (HCC) 10/13/2022   CAD (coronary artery disease)    Cerebral infarction, unspecified (HCC) 09/27/2012   Chest pain of uncertain etiology 10/06/2022   Depression    Diabetes mellitus without complication (HCC)    Diabetic hyperosmolar non-ketotic state (HCC) 01/19/2023   DKA (diabetic ketoacidosis) (HCC) 02/19/2021   History of CT scan    History of mammogram    History of MRI    Hypertension    Pheochromocytoma    Reversible cerebrovascular vasoconstriction syndrome    Spell of abnormal behavior    Stroke (HCC)    Thyroid  disease    Past Surgical History:  Past Surgical History:  Procedure Laterality Date   ABDOMINAL HYSTERECTOMY     BIOPSY  02/06/2023   Procedure: BIOPSY;  Surgeon: Ace Holder, MD;  Location: [facility];  Service: Gastroenterology;;   CESAREAN SECTION     3   CORONARY ARTERY BYPASS GRAFT     ESOPHAGOGASTRODUODENOSCOPY (EGD) WITH PROPOFOL  N/A 02/06/2023   Procedure: ESOPHAGOGASTRODUODENOSCOPY (EGD) WITH PROPOFOL ;  Surgeon: Ace Holder, MD;  Location: [facility];  Service: Gastroenterology;  Laterality: N/A;   Right adrenal gland removal for pheochromocytoma Right    HPI:  Lori [name] is a 53 year old married female, lives at home with her spouse, wheelchair mobile, PMH of hypertension, hyperlipidemia, GERD, diabetes, gastroparesis, CAD status post CABG,  hyperthyroidism on methimazole , carotid artery disease, CVA, dysphagia, residual left hemiplegia with contractures, anemia, anxiety, depression, migraines, obesity, OSA, asthma, DVT, pheochromocytoma, recent hospitalization 4/24 - 4/28 for gastroparesis flare, went home for a few hours and then returned back to the ED on 4/28 with recurrent complaints of nausea, vomiting and abdominal pain.  Reports that she had about 4 episodes of nausea and vomiting, "orange-colored fluid", no coughing around emesis or frank blood.  Some abdominal pain, mostly right lower quadrant. MBS 01/31/21 with deep penetration of thin liquids/moderate oropharyngeal dysphagia; ST consulted for clinical swallow evaluation.    Assessment / Plan / Recommendation  Clinical Impression  Pt seen for limited clinical swallow evaluation with oropharyngeal dysphagia noted with thin/puree/Soft solid consistencies with multiple swallows and delayed initiation of swallow observed with thin via straw and Dysphagia 3 consistency.  Impaired mastication/prolonged bolus manipulation seen with soft solid consistency also.  Pt stated she usually eats soft foods such as cream of wheat or drinks Ensure or liquids primarily d/t gastroparesis.  Delayed cough response noted 2-3 minutes post intake which could represent esophageal dysphagia and/or pharyngeal involvement.  Vocal quality observed to be hypophonic and intermittently aphonic without increasing vocal intensity with min cues.  Continue pt preferred Dysphagia 3/thin via small volumes/slow rate and FULL supervision with meals/A with self-feeding.  ST will f/u for diet tolerance/education and need for repeat MBS during acute stay.  Thank you for this consult. SLP Visit Diagnosis: Dysphagia, oropharyngeal phase (R13.12)    Aspiration Risk  Mild aspiration risk;Moderate aspiration risk    Diet Recommendation   Thin;Dysphagia 3 (mechanical soft) (pt  preferred)  Medication Administration: Whole meds  with puree    Other  Recommendations Oral Care Recommendations: Oral care BID;Staff/trained caregiver to provide oral care    Recommendations for follow up therapy are one component of a multi-disciplinary discharge planning process, led by the attending physician.  Recommendations may be updated based on patient status, additional functional criteria and insurance authorization.  Follow up Recommendations Follow physician's recommendations for discharge plan and follow up therapies      Assistance Recommended at Discharge  Full  Functional Status Assessment Patient has had a recent decline in their functional status and demonstrates the ability to make significant improvements in function in a reasonable and predictable amount of time.  Frequency and Duration min 2x/week  1 week       Prognosis Prognosis for improved oropharyngeal function: Good      Swallow Study   General Date of Onset: 06/02/23 HPI: Lori [name] is a 53 year old married female, lives at home with her spouse, wheelchair mobile, PMH of hypertension, hyperlipidemia, GERD, diabetes, gastroparesis, CAD status post CABG, hyperthyroidism on methimazole , carotid artery disease, CVA, dysphagia, residual left hemiplegia with contractures, anemia, anxiety, depression, migraines, obesity, OSA, asthma, DVT, pheochromocytoma, recent hospitalization 4/24 - 4/28 for gastroparesis flare, went home for a few hours and then returned back to the ED on 4/28 with recurrent complaints of nausea, vomiting and abdominal pain.  Reports that she had about 4 episodes of nausea and vomiting, "orange-colored fluid", no coughing around emesis or frank blood.  Some abdominal pain, mostly right lower quadrant. MBS 01/31/21 with deep penetration of thin liquids/moderate oropharyngeal dysphagia; ST consulted for clinical swallow evaluation. Type of Study: Bedside Swallow Evaluation Previous Swallow Assessment: MBS 01/31/21 with deep  penetration of thin, delayed swallow response, D3/NTL recommended. Diet Prior to this Study: Dysphagia 3 (mechanical soft);Thin liquids (Level 0) Temperature Spikes Noted: No Respiratory Status: Room air History of Recent Intubation: No Behavior/Cognition: Alert;Cooperative;Requires cueing Oral Cavity Assessment: Within Functional Limits Oral Care Completed by SLP: Other (Comment) (pt refused) Oral Cavity - Dentition: Dentures, top;Dentures, bottom;Dentures, not available;Edentulous Self-Feeding Abilities: Needs assist;Able to feed self Patient Positioning: Upright in bed Baseline Vocal Quality: Low vocal intensity Volitional Cough: Strong Volitional Swallow: Able to elicit    Oral/Motor/Sensory Function Overall Oral Motor/Sensory Function: Mild impairment Facial Symmetry: Abnormal symmetry left;Other (Comment) (mild) Facial Sensation: Reduced left   Ice Chips Ice chips: Not tested   Thin Liquid Thin Liquid: Impaired Presentation: Straw;Self Fed Oral Phase Impairments: Reduced labial seal Oral Phase Functional Implications: Left anterior spillage;Other (comment) Pharyngeal  Phase Impairments: Suspected delayed Swallow;Cough - Delayed    Nectar Thick Nectar Thick Liquid: Not tested   Honey Thick Honey Thick Liquid: Not tested   Puree Puree: Not tested   Solid     Solid: Impaired Presentation: Spoon Oral Phase Impairments: Impaired mastication;Reduced lingual movement/coordination Oral Phase Functional Implications: Prolonged oral transit;Impaired mastication Pharyngeal Phase Impairments: Suspected delayed Swallow      Pat Blossie Raffel,M.S.,CCC-SLP 06/09/2023,10:36 AM

## 2023-06-10 ENCOUNTER — Other Ambulatory Visit (HOSPITAL_COMMUNITY): Payer: Self-pay

## 2023-06-10 LAB — CBC
HCT: 28.5 % — ABNORMAL LOW (ref 36.0–46.0)
Hemoglobin: 9.6 g/dL — ABNORMAL LOW (ref 12.0–15.0)
MCH: 27 pg (ref 26.0–34.0)
MCHC: 33.7 g/dL (ref 30.0–36.0)
MCV: 80.3 fL (ref 80.0–100.0)
Platelets: 447 10*3/uL — ABNORMAL HIGH (ref 150–400)
RBC: 3.55 MIL/uL — ABNORMAL LOW (ref 3.87–5.11)
RDW: 15.6 % — ABNORMAL HIGH (ref 11.5–15.5)
WBC: 7.3 10*3/uL (ref 4.0–10.5)
nRBC: 0 % (ref 0.0–0.2)

## 2023-06-10 LAB — GLUCOSE, CAPILLARY
Glucose-Capillary: 159 mg/dL — ABNORMAL HIGH (ref 70–99)
Glucose-Capillary: 167 mg/dL — ABNORMAL HIGH (ref 70–99)
Glucose-Capillary: 179 mg/dL — ABNORMAL HIGH (ref 70–99)
Glucose-Capillary: 164 mg/dL — ABNORMAL HIGH (ref 70–99)

## 2023-06-10 LAB — COMPREHENSIVE METABOLIC PANEL WITH GFR
ALT: 11 U/L (ref 0–44)
AST: 11 U/L — ABNORMAL LOW (ref 15–41)
Albumin: 2.3 g/dL — ABNORMAL LOW (ref 3.5–5.0)
Alkaline Phosphatase: 56 U/L (ref 38–126)
Anion gap: 7 (ref 5–15)
BUN: 6 mg/dL (ref 6–20)
CO2: 21 mmol/L — ABNORMAL LOW (ref 22–32)
Calcium: 8.2 mg/dL — ABNORMAL LOW (ref 8.9–10.3)
Chloride: 108 mmol/L (ref 98–111)
Creatinine, Ser: 0.8 mg/dL (ref 0.44–1.00)
GFR, Estimated: >60 mL/min (ref >60)
Glucose, Bld: 166 mg/dL — ABNORMAL HIGH (ref 70–99)
Potassium: 3.7 mmol/L (ref 3.5–5.1)
Sodium: 136 mmol/L (ref 135–145)
Total Bilirubin: 0.4 mg/dL (ref 0.0–1.2)
Total Protein: 5.4 g/dL — ABNORMAL LOW (ref 6.5–8.1)

## 2023-06-10 MED ORDER — METOCLOPRAMIDE HCL 10 MG PO TABS
10.0000 mg | ORAL_TABLET | Freq: Four times a day (QID) | ORAL | 0 refills | Status: DC | PRN
  Filled 2023-06-10: qty 30, 8d supply, fill #0

## 2023-06-10 NOTE — Evaluation (Addendum)
 Occupational Therapy Evaluation Patient Details Name: Lori [name] MRN: [id number] DOB: Nov 06, 1970 Today's Date: 06/10/2023   History of Present Illness   53 y.o. female presents to [facility] hospital on 06/02/2023 after discharging earlier in the day with symptoms of nausea, vomiting and abdominal pain. Pt was recently hospitalized 4/24-4/28 for a gastroparesis flare. PMH includes HTN, HLD, GERD, DM, CABG, CVA with residual L hemiplegia, anxiety/depression, migraines, ISA, asthma, DVT.     Clinical Impressions Pt c/o pain to abdomen, 5/10. Pt lives at home with husband who works during the day, has PCA 3hrs/day, PLOF needs help with all ADLs. Pt likely close to baseline, total for dressing/bathing at bed level, max-total for bed mobility, declined sit to stand or transfer. Pt LUE with L hand flexion contracture at [facility], states she does not wear a splint, was able to get palm guard for Pt and fitted to L hand, comfortable, good digit positioning. Recommending postacute rehab <3hrs/day to maximize participation in ADLs and overall strength, will continue to follow acutely to progress as able.      If plan is discharge home, recommend the following:   Two people to help with walking and/or transfers;A lot of help with bathing/dressing/bathroom;Assistance with cooking/housework;Assist for transportation;Help with stairs or ramp for entrance     Functional Status Assessment   Patient has had a recent decline in their functional status and demonstrates the ability to make significant improvements in function in a reasonable and predictable amount of time.     Equipment Recommendations   Other (comment) (palm guard/hand roll splint)     Recommendations for Other Services         Precautions/Restrictions   Precautions Precautions: Fall Recall of Precautions/Restrictions: Intact Precaution/Restrictions Comments: chronic L spastic hemiplegia Restrictions Weight  Bearing Restrictions Per Provider Order: No     Mobility Bed Mobility Overal bed mobility: Needs Assistance Bed Mobility: Supine to Sit, Sit to Supine     Supine to sit: Max assist, HOB elevated, Used rails Sit to supine: Total assist   General bed mobility comments: max-total for in/out of bed.    Transfers Overall transfer level: Needs assistance                 General transfer comment: declined      Balance Overall balance assessment: Needs assistance Sitting-balance support: Single extremity supported, Feet supported Sitting balance-Leahy Scale: Poor                                     ADL either performed or assessed with clinical judgement   ADL Overall ADL's : Needs assistance/impaired     Grooming: Moderate assistance;Bed level   Upper Body Bathing: Total assistance;Bed level   Lower Body Bathing: Total assistance;Bed level   Upper Body Dressing : Total assistance   Lower Body Dressing: Total assistance;Bed level       Toileting- Clothing Manipulation and Hygiene: Total assistance;Bed level         General ADL Comments: Pt bed level for dressing/bathing, needs help from husband/PCA at baseline. Pt declined STS/transfer     Vision Ability to See in Adequate Light: 1 Impaired Patient Visual Report: Peripheral vision impairment;Other (comment) (difficult with visual scanning to L side)       Perception         Praxis         Pertinent Vitals/Pain Pain Assessment Pain Assessment: 0-10  Pain Score: 5  Pain Location: pain in abdomen Pain Descriptors / Indicators: Grimacing Pain Intervention(s): Monitored during session     Extremity/Trunk Assessment Upper Extremity Assessment Upper Extremity Assessment: RUE deficits/detail;LUE deficits/detail RUE Deficits / Details: grossly WFLs, slow to initiate movement, decreased FM skils, increased effort RUE Sensation: WNL RUE Coordination: decreased fine motor LUE Deficits /  Details: no voluntary AROM, spastic movement at times, stiff at shoudler/elbow but almost full PROM. Contracture at [facility], would benefit from hand roll splint LUE: Shoulder pain with ROM LUE Sensation: WNL LUE Coordination: decreased fine motor;decreased gross motor   Lower Extremity Assessment Lower Extremity Assessment: Defer to PT evaluation       Communication Communication Communication: Impaired Factors Affecting Communication: Reduced clarity of speech   Cognition Arousal: Alert Behavior During Therapy: WFL for tasks assessed/performed Cognition: No family/caregiver present to determine baseline             OT - Cognition Comments: reduced clarity of speech, no fully oriented to date, mild difficulty following commands, may not be fully aware of deficits                 Following commands: Intact, Impaired Following commands impaired: Follows one step commands with increased time     Cueing  General Comments   Cueing Techniques: Verbal cues;Gestural cues      Exercises     Shoulder Instructions      Home Living Family/patient expects to be discharged to:: Private residence Living Arrangements: Spouse/significant other Available Help at Discharge: Family;Available PRN/intermittently;Personal care attendant Type of Home: Apartment Home Access: Level entry     Home Layout: One level     Bathroom Shower/Tub: Chief Strategy Officer: Standard     Home Equipment: Hospital bed;Wheelchair - Engineering geologist (2 wheels)   Additional Comments: has home health aide 3 hours/day      Prior Functioning/Environment Prior Level of Function : Needs assist             Mobility Comments: pt performs stand pivot transfers from bed to wheelchair with assistance of her aide. Pt requires assistance for bed mobility at baseline. Pt is dependent for wheelchair mobility. ADLs Comments: Pt reports performing self-feeding, otherwise  requires assistance for all ADLs    OT Problem List: Decreased strength;Decreased range of motion;Decreased activity tolerance;Impaired balance (sitting and/or standing);Decreased coordination;Decreased cognition;Decreased safety awareness;Impaired UE functional use;Pain   OT Treatment/Interventions: Self-care/ADL training;Therapeutic exercise;DME and/or AE instruction;Manual therapy;Therapeutic activities;Patient/family education;Balance training      OT Goals(Current goals can be found in the care plan section)   Acute Rehab OT Goals Patient Stated Goal: not able to participate in goal setting OT Goal Formulation: Patient unable to participate in goal setting Time For Goal Achievement: 06/24/23 Potential to Achieve Goals: Good   OT Frequency:  Min 1X/week    Co-evaluation              AM-PAC OT "6 Clicks" Daily Activity     Outcome Measure Help from another person eating meals?: A Lot Help from another person taking care of personal grooming?: A Lot Help from another person toileting, which includes using toliet, bedpan, or urinal?: Total Help from another person bathing (including washing, rinsing, drying)?: Total Help from another person to put on and taking off regular upper body clothing?: Total Help from another person to put on and taking off regular lower body clothing?: Total 6 Click Score: 8   End of Session Nurse Communication: Mobility  status  Activity Tolerance: Patient tolerated treatment well Patient left: in bed;with call bell/phone within reach  OT Visit Diagnosis: Muscle weakness (generalized) (M62.81);Unsteadiness on feet (R26.81);Hemiplegia and hemiparesis;Pain Hemiplegia - Right/Left: Left                Time: 4540-9811 OT Time Calculation (min): 24 min Charges:  OT General Charges $OT Visit: 1 Visit OT Evaluation $OT Eval Moderate Complexity: 1 Mod OT Treatments $Self Care/Home Management : 8-22 mins  [place], OTR/L   Scherry Curtis 06/10/2023, 11:16 AM

## 2023-06-10 NOTE — Plan of Care (Signed)
  Problem: Fluid Volume: Goal: Ability to maintain a balanced intake and output will improve Outcome: Adequate for Discharge

## 2023-06-10 NOTE — Progress Notes (Signed)
 DISCHARGE NOTE HOME Lori [name] [name] to be discharged Home per MD order. Discussed prescriptions and follow up appointments with the patient/spouse.  Prescriptions given to patient/PTAR and discharge packet.   Skin clean, dry and intact without evidence of skin break down, no evidence of skin tears noted. IV catheter discontinued intact. Site without signs and symptoms of complications. Dressing and pressure applied. Pt denies pain at the site currently. No complaints noted.  Patient free of lines, drains, and wounds.   An After Visit Summary (AVS) was printed and given to the patient.  Patient being discharge with PTAR.   Evaristo Hind, RN

## 2023-06-10 NOTE — Discharge Summary (Signed)
 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 *  Hospital Course: 53 year old married female, lives at home with her spouse, wheelchair mobile, PMH of hypertension, hyperlipidemia, GERD, diabetes, gastroparesis, CAD status post CABG, hyperthyroidism on methimazole , carotid artery disease, CVA, dysphagia, residual left hemiplegia with contractures, anemia, anxiety, depression, migraines, obesity, OSA, asthma, DVT, pheochromocytoma, recent hospitalization 4/24 - 4/28 for gastroparesis flare, went home for a few hours and then returned back to the ED on 4/28 with recurrent complaints of nausea, vomiting and abdominal pain.  Reports that she had about 4 episodes of nausea and vomiting, "orange-colored fluid", no coughing around emesis or frank blood.  Some abdominal pain, mostly right lower quadrant.  States that she had not had a BM for 4 days and then had a large BM prior to return to ED with improvement in her abdominal pain.  After pain meds in the ED, reports some itching without skin rash.  States that she has had "22 strokes".  Also indicates that her gastroparesis flares have been more frequent in the last 6 months.  Denies any complaints including any UTI symptoms.   ED Course: Afebrile, mild transient tachycardia up to the 120s-130s which has since  resolved, initially high BP on arrival at 180/103 but since then soft blood pressures.  Not hypoxic. Lab work significant for potassium of 3.4, glucose 253, albumin  2.8, normal lipase, total protein 6.3, hemoglobin 11.1, WBC 11.3.  Urine pregnancy test negative.  UDS positive for opiates. In ED RU E PICC line was placed, patient has received IV fentanyl  x 1 IV Dilaudid  x 1 IV Ativan  x 1, IV Reglan  x 1, IV Zofran  x 1, 2 L IV fluid bolus.  Hospital admission requested.  Assessment and Plan: Suspected diabetic gastroparesis flare/constipation Minimize opioids as much as able.  . -Would cont with bowel regimen as needed -remains on liquid diet -Remains symptomatic  -Chart reviewed. Pt well known to [facility]. Per last consult note, GI recommendation was for:   "Reglan  changed from 5 mg IV every 6 hours to 10 mg IV every 6 hours.  Will leave at this dose at it may be helping as she is doing better today. -Continue pantoprazole  40 mg orally daily. -Compazine  and zofran  are on her list prn. -If symptoms are not improving can consider the addition of a scopolamine patch 1.5 mg transdermally every 72 hours -Carafate  slurry 1 g p.o. 4 times daily for symptoms of throat discomfort/odynophagia was added 4/6 as well. -Will give a one time dose of two Senokot-S and will order Miralax  daily starting tomorrow.  Needs a good bowel regimen to prevent constipation from worsening her symptoms."   -Now clinically much improved with 10mg  reglan  q6h. Cont dys3 and f/u on SLP -Discharged on reglan  10mg  q6h PRN   Hypokalemia   Poorly controlled type II DM A1c 10 on 04/25/2023.  For now since trying on liquid diet, treat with NovoLog  sensitive SSI.   -resume long-acting insulin     Essential hypertension Initially came in with hypertension but then intermittent soft blood pressures.  Cont home meds on d/c   Hyperthyroidism TSH normal.  Continue home dose of methimazole .   GERD Continue IV PPI    Hyperlipidemia Continue statins.   History of QTc prolongation Repeated EKG and personally reviewed, sinus rhythm, no acute changes and QTc 476 ms.   History of CVA with residual left hemiparesis Continue atorvastatin , Plavix  and also on Xarelto  for history of DVT   CAD s/p CABG Continue atorvastatin , beta-blocker & Plavix . Continue Ranexa    Anxiety and depression Continue Lexapro    History of DVT Continue home dose of Xarelto .  No GI bleed or any bleeding noted   Chronic diastolic CHF -Cont home meds on d/c       Consultants:  Procedures performed:   Disposition: Home Diet recommendation:  Dysphagia type 3 thin Liquid DISCHARGE MEDICATION: Allergies as of 06/10/2023       Reactions   Zoila Hines ] Anaphylaxis, Swelling, Other (See Comments)   Throat closes   Contrast Media [iodinated Contrast Media] Anaphylaxis, Swelling   Imitrex [sumatriptan] Anaphylaxis   Ms Contin  [morphine ] Anaphylaxis, Swelling   Penicillins Swelling, Other (See Comments)   Mouth and eyes swell   Chlorhexidine  Gluconate [chlorhexidine ] Itching, Rash   Firvanq  [vancomycin ] Other (See Comments)   Red Man's Syndrome   Cipro  [ciprofloxacin  Hcl] Nausea And Vomiting   Nitrofuran Derivatives Anxiety   Wound Dressing Adhesive Itching, Rash        Medication List     STOP taking these medications    ondansetron  4 MG disintegrating tablet Commonly known as: ZOFRAN -ODT       TAKE these medications    acetaminophen  325 MG tablet Commonly known as: TYLENOL  Take 2 tablets (650 mg total) by mouth every 6 (six) hours as needed for mild pain (pain score 1-3) (or Fever >/= 101).   albuterol  108 (90 Base) MCG/ACT inhaler Commonly known as: VENTOLIN  HFA INHALE 2 PUFFS BY MOUTH EVERY 6 HOURS AS NEEDED FOR WHEEZING AND FOR SHORTNESS OF BREATH Strength: 108 (90 Base) MCG/ACT   albuterol  (2.5 MG/3ML) 0.083% nebulizer solution Commonly known as: PROVENTIL  Take 3 mLs (2.5 mg total) by  nebulization every 6 (six) hours as needed for wheezing or shortness of breath.   atorvastatin  80 MG tablet Commonly known as: LIPITOR  TAKE 1 TABLET (80 MG TOTAL) BY MOUTH DAILY. What changed: when to take this   baclofen  10 MG tablet Commonly known as: LIORESAL  Take 1 tablet (10 mg total) by mouth 3 (three) times daily.   clopidogrel  75 MG tablet Commonly known as: PLAVIX  TAKE 1 TABLET BY MOUTH DAILY   cyclobenzaprine  5 MG tablet Commonly known as: FLEXERIL  Take 1 tablet (5 mg total) by mouth 3 (three) times daily as needed for muscle spasms.   diclofenac  Sodium 1 % Gel Commonly known as: Voltaren  Apply 1 Application topically 4 (four) times daily as needed (pain).   escitalopram  20 MG tablet Commonly known as: LEXAPRO  Take 1 tablet (20 mg total) by mouth every morning.   ezetimibe  10 MG tablet Commonly known as: ZETIA  TAKE 1 TABLET BY MOUTH DAILY   ferrous sulfate  325 (65 FE) MG tablet Take 1 tablet (325 mg total) by mouth daily with breakfast.   FreeStyle Libre 14 Day Sensor Misc 1 each by Does not apply route as needed. DX: E11.69  FreeStyle Libre 2 Reader Devi 1 Device by Does not apply route daily. E11.69   gabapentin  100 MG capsule Commonly known as: NEURONTIN  Take 100 mg by mouth 3 (three) times daily.   HumaLOG  KwikPen 100 UNIT/ML KwikPen Generic drug: insulin  lispro INJECT 4 UNITS SUBCUTANEOUSLY THREE TIMES A DAY WITH MEALS What changed: See the new instructions.   insulin  lispro 100 UNIT/ML injection Commonly known as: HUMALOG  Sliding scale AC, HS CBG 121 - 150: 2 units CBG 151 - 200: 3 units CBG 201 - 250: 5 units CBG 251 - 300: 8 units CBG 301 - 350: 11 units CBG 351 - 400: 15 units CBG > 400: call MD What changed: Another medication with the same name was changed. Make sure you understand how and when to take each.   Lantus  SoloStar 100 UNIT/ML Solostar Pen Generic drug: insulin  glargine Inject 10 Units into the skin at bedtime.   methimazole  5  MG tablet Commonly known as: TAPAZOLE  TAKE 1 TABLET BY MOUTH EVERY  MORNING   metoCLOPramide  10 MG tablet Commonly known as: REGLAN  Take 1 tablet (10 mg total) by mouth every 6 (six) hours as needed for refractory nausea / vomiting. What changed: when to take this   metoprolol  tartrate 50 MG tablet Commonly known as: LOPRESSOR  Take 50 mg by mouth 2 (two) times daily.   olmesartan -hydrochlorothiazide  40-25 MG tablet Commonly known as: BENICAR  HCT Take 1 tablet by mouth daily.   pantoprazole  40 MG tablet Commonly known as: PROTONIX  Take 1 tablet (40 mg total) by mouth 2 (two) times daily.   ranolazine  500 MG 12 hr tablet Commonly known as: RANEXA  Take 1 tablet (500 mg total) by mouth 2 (two) times daily.   senna-docusate 8.6-50 MG tablet Commonly known as: Senokot-S Take 1 tablet by mouth 2 (two) times daily.   tamsulosin  0.4 MG Caps capsule Commonly known as: FLOMAX  Take 1 capsule (0.4 mg total) by mouth at bedtime.   topiramate  25 MG tablet Commonly known as: TOPAMAX  Take 1 tablet (25 mg total) by mouth 2 (two) times daily. What changed:  how much to take when to take this   Xarelto  10 MG Tabs tablet Generic drug: rivaroxaban  TAKE 1 TABLET BY MOUTH DAILY        Follow-up Information     Ngetich, Dinah C, NP Follow up in 2 week(s).   Specialty: Family Medicine Why: Hospital follow up Contact information: [address] [place] [address] [phone]                Discharge Exam: Cleavon Curls Weights   06/08/23 0711 06/09/23 0500 06/10/23 0726  Weight: 80.8 kg 81 kg 78.2 kg   General exam: Awake, laying in bed, in nad Respiratory system: Normal respiratory effort, no wheezing Cardiovascular system: regular rate, s1, s2 Gastrointestinal system: Soft, nondistended, positive BS Central nervous system: CN2-12 grossly intact, strength intact Extremities: Perfused, no clubbing Skin: Normal skin turgor, no notable skin lesions seen Psychiatry: Mood  normal // no visual hallucinations   Condition at discharge: fair  The results of significant diagnostics from this hospitalization (including imaging, microbiology, ancillary and laboratory) are listed below for reference.   Imaging Studies: DG CHEST PORT 1 VIEW Result Date: 06/07/2023 CLINICAL DATA:  Pneumonia. EXAM: PORTABLE CHEST 1 VIEW COMPARISON:  Chest radiograph dated 05/10/2023. FINDINGS: Right-sided PICC with tip close to the cavoatrial junction. No focal consolidation, pleural effusion or pneumothorax. Stable cardiac silhouette. Median sternotomy wires and CABG vascular clips no acute osseous pathology.  IMPRESSION: No active disease. Electronically Signed   By: Angus Bark M.D.   On: 06/07/2023 18:04   DG Abd 1 View Result Date: 06/05/2023 CLINICAL DATA:  [id number] Abdominal pain [id number]. Diabetes. Gastroparesis. Abdominal pain. EXAM: ABDOMEN - 1 VIEW COMPARISON:  CT abdomen pelvis 05/28/2023, CT abdomen pelvis 04/27/2023 FINDINGS: Limited evaluation due to overlapping osseous structures and overlying soft tissues. The bowel gas pattern is normal. Round 6 mm calcification overlying the right upper quadrant likely related to pancreas given prior CTs. Surgical clips overlie the upper abdomen. IMPRESSION: Nonobstructive bowel gas pattern. Electronically Signed   By: Morgane  Naveau M.D.   On: 06/05/2023 19:42   US  EKG SITE RITE Result Date: 06/02/2023 If Site Rite image not attached, placement could not be confirmed due to current cardiac rhythm.  CT ABDOMEN PELVIS WO CONTRAST Result Date: 05/28/2023 CLINICAL DATA:  Abdominal pain, acute, nonlocalized EXAM: CT ABDOMEN AND PELVIS WITHOUT CONTRAST TECHNIQUE: Multidetector CT imaging of the abdomen and pelvis was performed following the standard protocol without IV contrast. RADIATION DOSE REDUCTION: This exam was performed according to the departmental dose-optimization program which includes automated exposure control, adjustment of the mA  and/or kV according to patient size and/or use of iterative reconstruction technique. COMPARISON:  04/27/2023 FINDINGS: Lower chest: No pleural or pericardial effusion. Coronary and aortic calcifications, post CABG. Linear scarring or atelectasis in the right lower lobe. Hepatobiliary: No focal liver abnormality is seen. No gallstones, gallbladder wall thickening, or biliary dilatation. Stable punctate metallic density posterior to the intrahepatic IVC. Pancreas: Scattered coarse pancreatic parenchymal calcifications predominantly in the head. No discrete mass or regional inflammatory changes. Spleen: Normal in size without focal abnormality. Adrenals/Urinary Tract: .left adrenal gland unremarkable. Metallic clips in the region of the right adrenal, which is not visualized. Symmetric renal contours without urolithiasis or hydronephrosis. Urinary bladder partially distended. Stomach/Bowel: Small hiatal hernia. Stomach incompletely distended. duodenal diverticulum. Small bowel decompressed. Normal appendix. The colon is partially distended, with a few scattered descending and sigmoid diverticula; no adjacent inflammatory changes. Vascular/Lymphatic: Moderate calcified aortoiliac plaque without AAA. No abdominal or pelvic adenopathy. Reproductive: Status post hysterectomy. No adnexal masses. Other: Stable 4 cm soft tissue density deep subcutaneous midline lesion in the anterior abdominal wall just above the symphysis pubis. No ascites. No free air. Musculoskeletal: Sternotomy wires. IMPRESSION: 1. No acute findings. 2. Colonic diverticulosis. 3.  Aortic Atherosclerosis (ICD10-I70.0). Electronically Signed   By: Nicoletta Barrier M.D.   On: 05/28/2023 15:23    Microbiology: Results for orders placed or performed during the hospital encounter of 04/27/23  Culture, blood (Routine X 2) w Reflex to ID Panel     Status: None   Collection Time: 04/27/23  8:04 PM   Specimen: BLOOD  Result Value Ref Range Status   Specimen  Description BLOOD SITE NOT SPECIFIED  Final   Special Requests   Final    BOTTLES DRAWN AEROBIC AND ANAEROBIC Blood Culture results may not be optimal due to an inadequate volume of blood received in culture bottles   Culture   Final    NO GROWTH 5 DAYS Performed at [facility], [address], [place], [place] [address]    Report Status 05/02/2023 FINAL  Final  Culture, blood (Routine X 2) w Reflex to ID Panel     Status: None   Collection Time: 04/27/23 11:04 PM   Specimen: BLOOD  Result Value Ref Range Status   Specimen Description BLOOD BLOOD RIGHT HAND  Final   Special  Requests   Final    BOTTLES DRAWN AEROBIC ONLY Blood Culture results may not be optimal due to an inadequate volume of blood received in culture bottles   Culture   Final    NO GROWTH 5 DAYS Performed at [facility], [address], [place], [place] [address]    Report Status 05/02/2023 FINAL  Final    Labs: CBC: Recent Labs  Lab 06/05/23 0238 06/07/23 0333 06/08/23 0229 06/09/23 0304 06/10/23 0410  WBC 7.2 9.7 8.1 7.8 7.3  HGB 9.3* 9.1* 9.7* 9.9* 9.6*  HCT 27.5* 28.0* 29.1* 29.9* 28.5*  MCV 81.8 81.9 80.6 80.8 80.3  PLT 322 357 392 426* 447*   Basic Metabolic Panel: Recent Labs  Lab 06/06/23 1454 06/07/23 0333 06/08/23 0229 06/09/23 0304 06/10/23 0410  NA 140 137 135 141 136  K 3.6 3.5 3.1* 3.9 3.7  CL 109 107 106 108 108  CO2 23 22 21* 21* 21*  GLUCOSE 148* 168* 144* 113* 166*  BUN 8 7 <5* <5* 6  CREATININE 1.02* 0.88 0.80 0.84 0.80  CALCIUM  8.4* 7.9* 8.1* 8.8* 8.2*   Liver Function Tests: Recent Labs  Lab 06/06/23 1454 06/07/23 0333 06/08/23 0229 06/09/23 0304 06/10/23 0410  AST 15 15 13* 11* 11*  ALT 12 12 11 11 11   ALKPHOS 53 54 58 57 56  BILITOT 0.6 0.7 0.4 0.5 0.4  PROT 5.3* 5.0* 5.3* 5.5* 5.4*  ALBUMIN  2.4* 2.1* 2.3* 2.4* 2.3*   CBG: Recent Labs  Lab 06/09/23 1228 06/09/23 1635 06/09/23 2229 06/10/23 0805 06/10/23 1144  GLUCAP 180* 202* 208* 159*  167*    Discharge time spent: less than 30 minutes.  Signed: Cherylle Corwin, MD [facility] 06/10/2023

## 2023-06-10 NOTE — TOC Transition Note (Addendum)
 Transition of Care [facility]) - Discharge Note   Patient Details  Name: Lori [name] MRN: [id number] Date of Birth: [birth year]  [facility] CM/SW Contact:  Tom-Johnson, Angelique Ken, RN Phone Number: 06/10/2023, 2:49 PM   Clinical Narrative:     Patient is scheduled for discharge today.  Readmission Risk Assessment done. Home health info, hospital f/u and discharge instructions on AVS. Prescriptions sent to [facility] pharmacy and patient will receive meds prior discharge. [facility] scheduled to transport at discharge.  No further TOC needs noted.        Final next level of care: Home w Home Health Services Barriers to Discharge: Barriers Resolved   Patient Goals and CMS Choice Patient states their goals for this hospitalization and ongoing recovery are:: To return home CMS Medicare.gov Compare Post Acute Care list provided to:: Patient Choice offered to / list presented to : Patient, Spouse      Discharge Placement                Patient to be transferred to facility by: [facility]      Discharge Plan and Services Additional resources added to the After Visit Summary for                  DME Arranged: N/A DME Agency: NA       HH Arranged: PT, RN, OT (Resumption of care) HH Agency: [facility] Date HH Agency Contacted: 06/10/23 Time HH Agency Contacted: 1444 Representative spoke with at [facility] Agency: Renetta Carter  Social Drivers of Health (SDOH) Interventions SDOH Screenings   Food Insecurity: No Food Insecurity (06/03/2023)  Housing: Low Risk  (06/03/2023)  Transportation Needs: No Transportation Needs (06/03/2023)  Utilities: Not At Risk (06/03/2023)  Depression (PHQ2-9): Low Risk  (05/15/2023)  Tobacco Use: Low Risk  (06/02/2023)     Readmission Risk Interventions    06/10/2023    2:47 PM 05/13/2023    3:06 PM 05/01/2023    3:01 PM  Readmission Risk Prevention Plan  Transportation Screening Complete Complete Complete   Medication Review [facility]) Referral to Pharmacy Complete Complete  PCP or Specialist appointment within 3-5 days of discharge Complete Complete Complete  HRI or Home Care Consult Complete Complete Complete  SW Recovery Care/Counseling Consult Complete Complete Complete  Palliative Care Screening Not Applicable Not Applicable Not Applicable  Skilled Nursing Facility Not Applicable Patient Refused Patient Refused

## 2023-06-11 ENCOUNTER — Telehealth: Payer: Self-pay

## 2023-06-11 NOTE — Transitions of Care (Post Inpatient/ED Visit) (Signed)
   06/11/2023  Name: Lori [name] MRN: [id number] DOB: [birth year]  Today's TOC FU Call Status: Today's TOC FU Call Status:: Unsuccessful Call (1st Attempt) Unsuccessful Call (1st Attempt) Date: 06/11/23  Attempted to reach the patient regarding the most recent Inpatient/ED visit.  Follow Up Plan: Additional outreach attempts will be made to reach the patient to complete the Transitions of Care (Post Inpatient/ED visit) call.   Gareld June, BSN, RN [place]  VBCI - [employer] [facility] RN Care Manager [phone]

## 2023-06-12 ENCOUNTER — Telehealth: Payer: Self-pay

## 2023-06-12 ENCOUNTER — Other Ambulatory Visit: Payer: Self-pay | Admitting: Family

## 2023-06-12 DIAGNOSIS — G43019 Migraine without aura, intractable, without status migrainosus: Secondary | ICD-10-CM

## 2023-06-12 NOTE — Transitions of Care (Post Inpatient/ED Visit) (Signed)
   06/12/2023  Name: Zahyra Blitz [name] MRN: [id number] DOB: 04-23-1970  Today's TOC FU Call Status: Today's TOC FU Call Status:: Unsuccessful Call (2nd Attempt) Unsuccessful Call (2nd Attempt) Date: 06/12/23  Attempted to reach the patient regarding the most recent Inpatient/ED visit.  Follow Up Plan: Additional outreach attempts will be made to reach the patient to complete the Transitions of Care (Post Inpatient/ED visit) call.   Tonia Frankel RN, CCM [place]  VBCI-Population Health RN Care Manager [phone]

## 2023-06-13 ENCOUNTER — Encounter: Payer: Self-pay | Admitting: Physical Medicine & Rehabilitation

## 2023-06-13 ENCOUNTER — Telehealth: Payer: Self-pay | Admitting: *Deleted

## 2023-06-13 ENCOUNTER — Telehealth: Payer: Self-pay

## 2023-06-13 ENCOUNTER — Encounter: Attending: Physical Medicine & Rehabilitation | Admitting: Physical Medicine & Rehabilitation

## 2023-06-13 VITALS — BP 139/95 | HR 84 | Ht 62.0 in

## 2023-06-13 DIAGNOSIS — G8114 Spastic hemiplegia affecting left nondominant side: Secondary | ICD-10-CM | POA: Insufficient documentation

## 2023-06-13 DIAGNOSIS — I639 Cerebral infarction, unspecified: Secondary | ICD-10-CM | POA: Diagnosis not present

## 2023-06-13 MED ORDER — ONABOTULINUMTOXINA 100 UNITS IJ SOLR
400.0000 [IU] | Freq: Once | INTRAMUSCULAR | Status: AC
  Administered 2023-06-13: 400 [IU] via INTRAMUSCULAR

## 2023-06-13 NOTE — Progress Notes (Signed)
 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 This includes bleeding, bruising, infection, excessive weakness, or medication side effects. A REMS form is on file and signed. Needle: 27g 1" needle electrode Number of units per muscle Total dose 300U  LEFT Biceps50 2+ Brachialis 75 1+ FCR50 2+ FCU0 FDS50 2+ FDP50 1+ Opponens pollicis 25 2+ Lumbricals 75 0-1+ PT 25 2+ All injections were done after obtaining appropriate EMG activity and after negative drawback for blood. Pt with iohexol  allergy , had topical betadine last time Aug 2024 without issue but used chlorhexidine  today . Post procedure instructions given   F/u 6 wks to assess efficacy ,

## 2023-06-13 NOTE — Progress Notes (Signed)
 Subjective:    Patient ID: Lori [name], female    DOB: 1970/10/21, 53 y.o.   MRN: [id number]  HPI   Pain Inventory Average Pain 4 Pain Right Now 0 My pain is intermittent and spasms  In the last 24 hours, has pain interfered with the following? General activity 0 Relation with others 0 Enjoyment of life 0 What TIME of day is your pain at its worst? morning , daytime, evening, and night Sleep (in general) Poor  Pain is worse with: unsure Pain improves with: nothing Relief from Meds: none  Family History  Problem Relation Age of Onset  . Hypertension Mother   . Diabetes Mother   . Atrial fibrillation Mother   . Hypertension Father   . Heart attack Father   . Dementia Father   . Diabetes Brother   . Brain cancer Brother   . Asthma Daughter   . Sickle cell anemia Son   . Atrial fibrillation Maternal Uncle    Social History   Socioeconomic History  . Marital status: Married    Spouse name: Not on file  . Number of children: Not on file  . Years of education: Not on file  . Highest education level: Not on file  Occupational History  . Not on file  Tobacco Use  . Smoking status: Never  . Smokeless tobacco: Never  Vaping Use  . Vaping status: Never Used  Substance and Sexual Activity  . Alcohol use: Not Currently  . Drug use: Never  . Sexual activity: Not on file  Other Topics Concern  . Not on file  Social History Narrative   Tobacco use, amount per day now: Never   Past tobacco use, amount per day: Never   How many years did you use tobacco: Never   Alcohol use (drinks per week): Not Currently.   Diet: Eat out a lot.    Do you drink/eat things with caffeine : Sweet Tea   Marital status:   Married                               What year were you married? 2000   Do you live in a house, apartment, assisted living, condo, trailer, etc.? House   Is it one or more stories? 1   How many persons live in your home? 2   Do you have pets in  your home?( please list) No   Highest Level of education completed? Bachelors Degree.   Current or past profession: Solicitor, [employer]   Do you exercise?   No                               Type and how often?   Do you have a living will? No   Do you have a DNR form?       No                            If not, do you want to discuss one?   Do you have signed POA/HPOA forms?   No                     If so, please bring to you appointment      Do you have any difficulty bathing or  dressing yourself? Yes   Do you have any difficulty preparing food or eating? Yes   Do you have any difficulty managing your medications? Yes   Do you have any difficulty managing your finances? No   Do you have any difficulty affording your medications? Yes   Social Drivers of Corporate investment banker Strain: Not on file  Food Insecurity: No Food Insecurity (06/03/2023)   Hunger Vital Sign   . Worried About Programme researcher, broadcasting/film/video in the Last Year: Never true   . Ran Out of Food in the Last Year: Never true  Transportation Needs: No Transportation Needs (06/03/2023)   PRAPARE - Transportation   . Lack of Transportation (Medical): No   . Lack of Transportation (Non-Medical): No  Physical Activity: Not on file  Stress: Not on file  Social Connections: Not on file   Past Surgical History:  Procedure Laterality Date  . ABDOMINAL HYSTERECTOMY    . BIOPSY  02/06/2023   Procedure: BIOPSY;  Surgeon: Ace Holder, MD;  Location: [facility] ENDOSCOPY;  Service: Gastroenterology;;  . CESAREAN SECTION     3  . CORONARY ARTERY BYPASS GRAFT    . ESOPHAGOGASTRODUODENOSCOPY (EGD) WITH PROPOFOL  N/A 02/06/2023   Procedure: ESOPHAGOGASTRODUODENOSCOPY (EGD) WITH PROPOFOL ;  Surgeon: Ace Holder, MD;  Location: [facility] ENDOSCOPY;  Service: Gastroenterology;  Laterality: N/A;  . Right adrenal gland removal for pheochromocytoma Right    Past Surgical History:  Procedure  Laterality Date  . ABDOMINAL HYSTERECTOMY    . BIOPSY  02/06/2023   Procedure: BIOPSY;  Surgeon: Ace Holder, MD;  Location: [facility] ENDOSCOPY;  Service: Gastroenterology;;  . CESAREAN SECTION     3  . CORONARY ARTERY BYPASS GRAFT    . ESOPHAGOGASTRODUODENOSCOPY (EGD) WITH PROPOFOL  N/A 02/06/2023   Procedure: ESOPHAGOGASTRODUODENOSCOPY (EGD) WITH PROPOFOL ;  Surgeon: Ace Holder, MD;  Location: [facility] ENDOSCOPY;  Service: Gastroenterology;  Laterality: N/A;  . Right adrenal gland removal for pheochromocytoma Right    Past Medical History:  Diagnosis Date  . Acute bronchitis 08/24/2021  . Acute cystitis 12/26/2022  . Acute metabolic encephalopathy 11/04/2022  . Acute renal failure (ARF) (HCC) 04/27/2023  . AKI (acute kidney injury) (HCC) 04/30/2022  . Aspiration pneumonia (HCC) 10/13/2022  . CAD (coronary artery disease)   . Cerebral infarction, unspecified (HCC) 09/27/2012  . Chest pain of uncertain etiology 10/06/2022  . Depression   . Diabetes mellitus without complication (HCC)   . Diabetic hyperosmolar non-ketotic state (HCC) 01/19/2023  . DKA (diabetic ketoacidosis) (HCC) 02/19/2021  . History of CT scan   . History of mammogram   . History of MRI   . Hypertension   . Pheochromocytoma   . Reversible cerebrovascular vasoconstriction syndrome   . Spell of abnormal behavior   . Stroke (HCC)   . Thyroid  disease    BP (!) 139/95   Pulse 84   Ht 5\' 2"  (1.575 m)   SpO2 97%   BMI 31.53 kg/m   Opioid Risk Score:   Fall Risk Score:  `1  Depression screen [facility] 2/9     05/15/2023   10:48 AM 04/03/2023    2:19 PM 12/24/2022   10:29 AM 11/01/2022    3:05 PM 10/28/2022    1:49 PM 09/30/2022    2:16 PM 09/10/2022   12:29 PM  Depression screen PHQ 2/9  Decreased Interest 0 1 1 0 3 0 1  Down, Depressed, Hopeless 0 0 1 0 1 0 1  PHQ - 2 Score  0 1 2 0 4 0 2  Altered sleeping   1  1 0   Tired, decreased energy   0  1 0   Change in appetite   0  2 0   Feeling bad or failure  about yourself    0  1 0   Trouble concentrating   0  0 0   Moving slowly or fidgety/restless   0  0 0   Suicidal thoughts   0  0 0   PHQ-9 Score   3  9 0   Difficult doing work/chores   Not difficult at all        Review of Systems  Musculoskeletal:        Spasms all over the body  All other systems reviewed and are negative.      Objective:   Physical Exam        Assessment & Plan:

## 2023-06-13 NOTE — Telephone Encounter (Signed)
 Copied from CRM [id number]. Topic: Clinical - Home Health Verbal Orders >> Dee Paden 9, 2025 12:40 PM Brynn Caras wrote: Caller/Agency: Janalyn Me RN with [facility] Callback Number: [phone]  Service Requested: Skilled Nursing Frequency: [employer] is restricting home health services, any patient admitted at [facility] for home health services before 06/19/2023 will need to be discharged for insurance purposes, and [facility] will need to re-admit them. Janalyn Me is requesting for approval to see the patient on 06/23/2023 Monday for this reason. Any new concerns about the patient? No

## 2023-06-13 NOTE — Transitions of Care (Post Inpatient/ED Visit) (Signed)
   06/13/2023  Name: Lori [name] MRN: [id number] DOB: 05/20/1970  Today's TOC FU Call Status: Today's TOC FU Call Status:: Successful TOC FU Call Completed TOC FU Call Complete Date: 06/13/23 (Spoke with patient who states she has Home health and a PCS though Medicaid. states her husband works days and manages her medications so she would not be able to review and states she prefers face to face / declined participation) Patient's Name and Date of Birth confirmed.  Medications Reviewed Today: Patient declined participation and states husband manages medications but he works during the day/unable to complete assessment Medications Reviewed Today   Medications were not reviewed in this encounter    Tonia Frankel RN, CCM [place]  VBCI-Population Health RN Care Manager [phone]

## 2023-06-13 NOTE — Telephone Encounter (Signed)
 Lori [name] with [phone] Notified and agreed.

## 2023-06-13 NOTE — Patient Instructions (Signed)
 You received a Botox injection today. You may experience soreness at the needle injection sites. Please call us if any of the injection sites turns red after a couple days or if there is any drainage. You may experience muscle weakness as a result of Botox. This would improve with time but can take several weeks to improve. The Botox should start working in about one week. The Botox usually last 3 months. The injection can be repeated every 3 months as needed.

## 2023-06-13 NOTE — Telephone Encounter (Signed)
 Pharmacy requested refill.  ?Pended Rx and sent to [facility] for approval.  ?

## 2023-06-15 ENCOUNTER — Encounter (HOSPITAL_COMMUNITY): Payer: Self-pay

## 2023-06-15 ENCOUNTER — Emergency Department (HOSPITAL_COMMUNITY)

## 2023-06-15 ENCOUNTER — Other Ambulatory Visit: Payer: Self-pay

## 2023-06-15 ENCOUNTER — Emergency Department (HOSPITAL_COMMUNITY)
Admission: EM | Admit: 2023-06-15 | Discharge: 2023-06-15 | Disposition: A | Discharge status: Home / Self Care | Attending: Emergency Medicine | Admitting: Emergency Medicine

## 2023-06-15 DIAGNOSIS — R109 Unspecified abdominal pain: Secondary | ICD-10-CM | POA: Diagnosis not present

## 2023-06-15 DIAGNOSIS — Z794 Long term (current) use of insulin: Secondary | ICD-10-CM | POA: Insufficient documentation

## 2023-06-15 DIAGNOSIS — K449 Diaphragmatic hernia without obstruction or gangrene: Secondary | ICD-10-CM | POA: Diagnosis not present

## 2023-06-15 DIAGNOSIS — E1143 Type 2 diabetes mellitus with diabetic autonomic (poly)neuropathy: Secondary | ICD-10-CM | POA: Diagnosis not present

## 2023-06-15 DIAGNOSIS — R531 Weakness: Secondary | ICD-10-CM | POA: Diagnosis not present

## 2023-06-15 DIAGNOSIS — E876 Hypokalemia: Secondary | ICD-10-CM | POA: Insufficient documentation

## 2023-06-15 DIAGNOSIS — Z79899 Other long term (current) drug therapy: Secondary | ICD-10-CM | POA: Diagnosis not present

## 2023-06-15 DIAGNOSIS — Z7901 Long term (current) use of anticoagulants: Secondary | ICD-10-CM | POA: Insufficient documentation

## 2023-06-15 DIAGNOSIS — K3184 Gastroparesis: Secondary | ICD-10-CM | POA: Diagnosis not present

## 2023-06-15 DIAGNOSIS — R4702 Dysphasia: Secondary | ICD-10-CM | POA: Diagnosis not present

## 2023-06-15 DIAGNOSIS — I2510 Atherosclerotic heart disease of native coronary artery without angina pectoris: Secondary | ICD-10-CM | POA: Insufficient documentation

## 2023-06-15 DIAGNOSIS — I10 Essential (primary) hypertension: Secondary | ICD-10-CM | POA: Insufficient documentation

## 2023-06-15 DIAGNOSIS — Z7401 Bed confinement status: Secondary | ICD-10-CM | POA: Diagnosis not present

## 2023-06-15 DIAGNOSIS — R112 Nausea with vomiting, unspecified: Secondary | ICD-10-CM

## 2023-06-15 DIAGNOSIS — D72829 Elevated white blood cell count, unspecified: Secondary | ICD-10-CM | POA: Diagnosis not present

## 2023-06-15 DIAGNOSIS — Z743 Need for continuous supervision: Secondary | ICD-10-CM | POA: Diagnosis not present

## 2023-06-15 DIAGNOSIS — R000 Tachycardia, unspecified: Secondary | ICD-10-CM | POA: Diagnosis not present

## 2023-06-15 DIAGNOSIS — E119 Type 2 diabetes mellitus without complications: Secondary | ICD-10-CM | POA: Diagnosis not present

## 2023-06-15 LAB — CBC WITH DIFFERENTIAL/PLATELET
Abs Immature Granulocytes: 0.06 10*3/uL (ref 0.00–0.07)
Basophils Absolute: 0.1 10*3/uL (ref 0.0–0.1)
Basophils Relative: 1 %
Eosinophils Absolute: 0.1 10*3/uL (ref 0.0–0.5)
Eosinophils Relative: 1 %
HCT: 36.6 % (ref 36.0–46.0)
Hemoglobin: 12.2 g/dL (ref 12.0–15.0)
Immature Granulocytes: 0 %
Lymphocytes Relative: 13 %
Lymphs Abs: 1.9 10*3/uL (ref 0.7–4.0)
MCH: 26.2 pg (ref 26.0–34.0)
MCHC: 33.3 g/dL (ref 30.0–36.0)
MCV: 78.7 fL — ABNORMAL LOW (ref 80.0–100.0)
Monocytes Absolute: 1.1 10*3/uL — ABNORMAL HIGH (ref 0.1–1.0)
Monocytes Relative: 8 %
Neutro Abs: 11 10*3/uL — ABNORMAL HIGH (ref 1.7–7.7)
Neutrophils Relative %: 77 %
Platelets: 640 10*3/uL — ABNORMAL HIGH (ref 150–400)
RBC: 4.65 MIL/uL (ref 3.87–5.11)
RDW: 15.5 % (ref 11.5–15.5)
WBC: 14.3 10*3/uL — ABNORMAL HIGH (ref 4.0–10.5)
nRBC: 0 % (ref 0.0–0.2)

## 2023-06-15 LAB — URINALYSIS, ROUTINE W REFLEX MICROSCOPIC
Bilirubin Urine: NEGATIVE
Glucose, UA: NEGATIVE mg/dL
Hgb urine dipstick: NEGATIVE
Ketones, ur: 20 mg/dL — AB
Leukocytes,Ua: NEGATIVE
Nitrite: NEGATIVE
Protein, ur: NEGATIVE mg/dL
Specific Gravity, Urine: 1.009 (ref 1.005–1.030)
pH: 7 (ref 5.0–8.0)

## 2023-06-15 LAB — COMPREHENSIVE METABOLIC PANEL WITH GFR
ALT: 11 U/L (ref 0–44)
AST: 14 U/L — ABNORMAL LOW (ref 15–41)
Albumin: 3.3 g/dL — ABNORMAL LOW (ref 3.5–5.0)
Alkaline Phosphatase: 60 U/L (ref 38–126)
Anion gap: 12 (ref 5–15)
BUN: 6 mg/dL (ref 6–20)
CO2: 21 mmol/L — ABNORMAL LOW (ref 22–32)
Calcium: 9.4 mg/dL (ref 8.9–10.3)
Chloride: 107 mmol/L (ref 98–111)
Creatinine, Ser: 0.78 mg/dL (ref 0.44–1.00)
GFR, Estimated: >60 mL/min (ref >60)
Glucose, Bld: 182 mg/dL — ABNORMAL HIGH (ref 70–99)
Potassium: 3.4 mmol/L — ABNORMAL LOW (ref 3.5–5.1)
Sodium: 140 mmol/L (ref 135–145)
Total Bilirubin: 0.6 mg/dL (ref 0.0–1.2)
Total Protein: 6.9 g/dL (ref 6.5–8.1)

## 2023-06-15 LAB — LIPASE, BLOOD: Lipase: 38 U/L (ref 11–51)

## 2023-06-15 LAB — CBG MONITORING, ED: Glucose-Capillary: 180 mg/dL — ABNORMAL HIGH (ref 70–99)

## 2023-06-15 MED ORDER — SODIUM CHLORIDE 0.9 % IV BOLUS
1000.0000 mL | Freq: Once | INTRAVENOUS | Status: AC
  Administered 2023-06-15: 1000 mL via INTRAVENOUS

## 2023-06-15 MED ORDER — PANTOPRAZOLE SODIUM 40 MG IV SOLR
40.0000 mg | Freq: Once | INTRAVENOUS | Status: AC
  Administered 2023-06-15: 40 mg via INTRAVENOUS
  Filled 2023-06-15: qty 10

## 2023-06-15 MED ORDER — DIPHENHYDRAMINE HCL 50 MG/ML IJ SOLN
25.0000 mg | Freq: Once | INTRAMUSCULAR | Status: AC
  Administered 2023-06-15: 25 mg via INTRAVENOUS
  Filled 2023-06-15: qty 1

## 2023-06-15 MED ORDER — METOPROLOL TARTRATE 5 MG/5ML IV SOLN
5.0000 mg | Freq: Once | INTRAVENOUS | Status: AC
  Administered 2023-06-15: 5 mg via INTRAVENOUS
  Filled 2023-06-15: qty 5

## 2023-06-15 MED ORDER — METOCLOPRAMIDE HCL 5 MG/ML IJ SOLN
5.0000 mg | Freq: Once | INTRAMUSCULAR | Status: AC
Start: 2023-06-15 — End: 2023-06-15
  Administered 2023-06-15: 5 mg via INTRAVENOUS
  Filled 2023-06-15: qty 2

## 2023-06-15 MED ORDER — METOPROLOL TARTRATE 25 MG PO TABS
50.0000 mg | ORAL_TABLET | Freq: Once | ORAL | Status: AC
  Administered 2023-06-15: 50 mg via ORAL
  Filled 2023-06-15: qty 2

## 2023-06-15 MED ORDER — SUCRALFATE 1 GM/10ML PO SUSP
1.0000 g | Freq: Once | ORAL | Status: AC
  Administered 2023-06-15: 1 g via ORAL
  Filled 2023-06-15: qty 10

## 2023-06-15 MED ORDER — HYDROMORPHONE HCL 1 MG/ML IJ SOLN
0.5000 mg | Freq: Once | INTRAMUSCULAR | Status: AC
  Administered 2023-06-15: 0.5 mg via INTRAVENOUS
  Filled 2023-06-15: qty 1

## 2023-06-15 MED ORDER — METOCLOPRAMIDE HCL 10 MG PO TABS: 10.0000 mg | ORAL_TABLET | Freq: Four times a day (QID) | ORAL | 0 refills | Status: DC

## 2023-06-15 NOTE — ED Notes (Addendum)
 EDP Long, MD notified to patient's vital signs.

## 2023-06-15 NOTE — Discharge Instructions (Addendum)
   It was a pleasure caring for you today in the emergency department.  Please continue taking your pantoprazole  as directed  I have sent refill for your REGLAN  to your pharmacy  Please follow up with your PCP in the next few days for recheck  Please return to the emergency department for any worsening or worrisome symptoms.

## 2023-06-15 NOTE — ED Notes (Signed)
 Pt discharged into care of ptar for transport home, husband called per pt request.

## 2023-06-15 NOTE — ED Notes (Signed)
 Husband called per PT request about pending discharge.

## 2023-06-15 NOTE — ED Notes (Signed)
 This RN spoke to pt's husband on the phone & he is home & will be there when Lori [name] brings her home.

## 2023-06-15 NOTE — ED Notes (Signed)
 [facility] called uable to give pick up time

## 2023-06-15 NOTE — ED Provider Notes (Signed)
 [facility] AT [facility] Provider Note  CSN: [id number] Arrival date & time: 06/15/23 3244  Chief Complaint(s) Nausea and Emesis  HPI Lori [name] is a 53 y.o. female with past medical history as below, significant for CAD, cva, IDDM, gastroparesis who presents to the ED with complaint of nausea vomiting  Patient reports she began vomiting last night around 9 PM.  She took her Reglan  which normally helps which did not alleviate her nausea or vomiting.  Reports vomiting throughout the evening.  Emesis nonbloody nonbilious.  Having generalized abdominal cramping, no fevers, no chest pain or dyspnea, no diarrhea, no change in urination or dysuria.  Has not taken her morning medications.  Past Medical History Past Medical History:  Diagnosis Date   Acute bronchitis 08/24/2021   Acute cystitis 12/26/2022   Acute metabolic encephalopathy 11/04/2022   Acute renal failure (ARF) (HCC) 04/27/2023   AKI (acute kidney injury) (HCC) 04/30/2022   Aspiration pneumonia (HCC) 10/13/2022   CAD (coronary artery disease)    Cerebral infarction, unspecified (HCC) 09/27/2012   Chest pain of uncertain etiology 10/06/2022   Depression    Diabetes mellitus without complication (HCC)    Diabetic hyperosmolar non-ketotic state (HCC) 01/19/2023   DKA (diabetic ketoacidosis) (HCC) 02/19/2021   History of CT scan    History of mammogram    History of MRI    Hypertension    Pheochromocytoma    Reversible cerebrovascular vasoconstriction syndrome    Spell of abnormal behavior    Stroke [facility])    Thyroid  disease    Patient Active Problem List   Diagnosis Date Noted   Gastroparesis 06/06/2023   Abdominal pain 05/30/2023   Intractable nausea and vomiting 05/29/2023   Allergy to radiographic contrast media 05/20/2023   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   Other disorders of orbit 05/20/2023   Gastrointestinal hemorrhage 02/13/2023   Anxiety and  depression 02/13/2023   Insulin  dependent type 2 diabetes mellitus (HCC) 02/13/2023   Acute esophagitis 02/06/2023   Adenomatous duodenal polyp 02/06/2023   Gastric polyp 02/06/2023   COVID-19 virus infection 02/05/2023   Hematemesis 02/04/2023   Anticoagulated 02/04/2023   Urinary incontinence 01/22/2023   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   Cerebrovascular disease 01/03/2023   Thrombocytosis 01/02/2023   OSA on CPAP 01/02/2023   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 12/25/2022   History of DVT (deep vein thrombosis) 12/25/2022   S/P total hysterectomy 12/24/2022   Delirium 11/22/2022   Acute encephalopathy 11/04/2022   Antiplatelet or antithrombotic long-term use 11/04/2022   Hypotension 11/04/2022   Morbid obesity (HCC) 10/13/2022   Chronic diastolic CHF (congestive heart failure) (HCC) 10/13/2022   Chronic anemia 10/13/2022   Acute pancreatitis 10/11/2022   Mild intermittent asthma, uncomplicated 07/04/2022   Lethargic 05/01/2022   Wheelchair dependence 04/08/2022   Dyspnea 11/02/2021   GERD (gastroesophageal reflux disease) 11/02/2021   Nausea and vomiting 08/25/2021   Hypothyroidism 07/10/2021   Functional urinary incontinence 05/17/2021   Diabetic gastroparesis (HCC) 03/16/2021   Migraines 03/16/2021   History of CVA with residual deficit    Debility 02/26/2021   Constipation    Weakness    Aphasia    Tachycardia 01/19/2021   CAD (coronary artery disease) 01/19/2021   Spastic hemiparesis of left 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 (HCC) 01/19/2021   Hyperthyroidism    Essential hypertension    Pheochromocytoma    Hyperlipidemia    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 Authorizing Provider  metoCLOPramide  (REGLAN ) 10 MG tablet Take 1 tablet (10 mg total) by mouth every 6 (six) hours. 06/15/23  Yes Russella Courts A, DO  acetaminophen  (TYLENOL ) 325 MG tablet Take 2 tablets (650 mg total) by mouth every 6 (six) hours as needed for mild pain (pain score 1-3) (or Fever >/= 101). 02/12/23   Armenta Landau, MD  albuterol  (PROVENTIL ) (2.5 MG/3ML) 0.083% nebulizer solution Take 3 mLs (2.5 mg total) by nebulization every 6 (six) hours as needed for wheezing or shortness of breath. 01/01/23   Ngetich, Dinah C, NP  albuterol  (VENTOLIN  HFA) 108 (90 Base) MCG/ACT inhaler INHALE 2 PUFFS BY MOUTH EVERY 6 HOURS AS NEEDED FOR WHEEZING AND FOR SHORTNESS OF BREATH Strength: 108 (90 Base) MCG/ACT 01/01/23   Ngetich, Dinah C, NP  atorvastatin  (LIPITOR ) 80 MG tablet TAKE 1 TABLET (80 MG TOTAL) BY MOUTH DAILY. Patient taking differently: Take 80 mg by mouth every evening. 03/31/23   Ngetich, Dinah C, NP  baclofen  (LIORESAL ) 10 MG tablet Take 1 tablet (10 mg total) by mouth 3 (three) times daily. 01/08/23   Ngetich, Dinah C, NP  clopidogrel  (PLAVIX ) 75 MG tablet TAKE 1 TABLET BY MOUTH DAILY 04/09/23   Ngetich, Dinah C, NP  Continuous Glucose Sensor (FREESTYLE LIBRE 14 DAY SENSOR) MISC 1 each by Does not apply route as needed. DX: E11.69 12/24/22   Ngetich, Dinah C, NP  cyclobenzaprine  (FLEXERIL ) 5 MG tablet Take 1 tablet (5 mg total) by mouth 3 (three) times daily as needed for muscle spasms. 05/07/23   Ngetich, Dinah C, NP  diclofenac  Sodium (VOLTAREN ) 1 % GEL Apply 1 Application topically 4 (four) times daily as needed (pain). 05/15/23   Ngetich, Dinah C, NP  escitalopram  (LEXAPRO ) 20 MG tablet Take 1 tablet (20 mg total) by mouth every morning. 01/01/23   Ngetich, Dinah C, NP  ezetimibe  (ZETIA ) 10 MG  tablet TAKE 1 TABLET BY MOUTH DAILY 04/09/23   Ngetich, Dinah C, NP  ferrous sulfate  325 (65 FE) MG tablet Take 1 tablet (325 mg total) by mouth daily with breakfast. 05/15/23   Ngetich, Dinah C, NP  gabapentin  (NEURONTIN ) 100 MG capsule Take 100 mg by mouth 3 (three) times daily. 11/14/22   [provider]  HUMALOG  KWIKPEN 100 UNIT/ML KwikPen INJECT 4 UNITS SUBCUTANEOUSLY THREE TIMES A DAY WITH MEALS Patient taking differently: Inject 7 Units into the skin with breakfast, with lunch, and with evening meal. 05/07/23   Ngetich, Dinah C, NP  insulin  glargine (LANTUS  SOLOSTAR) 100 UNIT/ML Solostar Pen Inject 10 Units into the skin at bedtime. 05/07/23   Ngetich, Dinah C, NP  insulin  lispro (HUMALOG ) 100 UNIT/ML injection Sliding scale AC, HS CBG 121 - 150: 2 units CBG 151 - 200: 3 units CBG 201 - 250: 5 units CBG 251 - 300: 8 units CBG 301 - 350: 11 units CBG 351 - 400: 15 units CBG > 400: call MD 05/07/23   Ngetich, Dinah C, NP  methimazole  (TAPAZOLE ) 5 MG tablet TAKE 1 TABLET BY MOUTH EVERY  MORNING 04/09/23   Ngetich, Dinah  C, NP  metoCLOPramide  (REGLAN ) 10 MG tablet Take 1 tablet (10 mg total) by mouth every 6 (six) hours as needed for refractory nausea / vomiting. 06/10/23   Oral Billings, MD  metoprolol  tartrate (LOPRESSOR ) 50 MG tablet Take 50 mg by mouth 2 (two) times daily.    [provider]  olmesartan -hydrochlorothiazide  (BENICAR  HCT) 40-25 MG tablet Take 1 tablet by mouth daily. 05/14/23   [provider]  pantoprazole  (PROTONIX ) 40 MG tablet Take 1 tablet (40 mg total) by mouth 2 (two) times daily. 05/15/23   Ngetich, Dinah C, NP  ranolazine  (RANEXA ) 500 MG 12 hr tablet Take 1 tablet (500 mg total) by mouth 2 (two) times daily. 10/09/22 01/23/24  [place], Rema Care, DO  rivaroxaban  (XARELTO ) 10 MG TABS tablet TAKE 1 TABLET BY MOUTH DAILY 04/09/23   Ngetich, Dinah C, NP  senna-docusate (SENOKOT-S) 8.6-50 MG tablet Take 1 tablet by mouth 2 (two) times daily. 02/12/23   Armenta Landau,  MD  tamsulosin  (FLOMAX ) 0.4 MG CAPS capsule Take 1 capsule (0.4 mg total) by mouth at bedtime. 05/07/23   Ngetich, Dinah C, NP  topiramate  (TOPAMAX ) 25 MG tablet TAKE 1 TABLET BY MOUTH TWICE DAILY 06/13/23   Ngetich, Elijio [place], NP                                                                                                                                    Past Surgical History Past Surgical History:  Procedure Laterality Date   ABDOMINAL HYSTERECTOMY     BIOPSY  02/06/2023   Procedure: BIOPSY;  Surgeon: Ace Holder, MD;  Location: [facility] ENDOSCOPY;  Service: Gastroenterology;;   CESAREAN SECTION     3   CORONARY ARTERY BYPASS GRAFT     ESOPHAGOGASTRODUODENOSCOPY (EGD) WITH PROPOFOL  N/A 02/06/2023   Procedure: ESOPHAGOGASTRODUODENOSCOPY (EGD) WITH PROPOFOL ;  Surgeon: Ace Holder, MD;  Location[facility];  Service: Gastroenterology;  Laterality: N/A;   Right adrenal gland removal for pheochromocytoma Right    Family History Family History  Problem Relation Age of Onset   Hypertension Mother    Diabetes Mother    Atrial fibrillation Mother    Hypertension Father    Heart attack Father    Dementia Father    Diabetes Brother    Brain cancer Brother    Asthma Daughter    Sickle cell anemia Son    Atrial fibrillation Maternal Uncle     Social History Social History   Tobacco Use   Smoking status: Never   Smokeless tobacco: Never  Vaping Use   Vaping status: Never Used  Substance Use Topics   Alcohol use: Not Currently   Drug use: Never   Allergies Asa [aspirin ], Contrast media [iodinated contrast media], Imitrex [sumatriptan], Ms contin  [morphine ], Penicillins, Chlorhexidine  gluconate [chlorhexidine ], Firvanq  [vancomycin ], Cipro  [ciprofloxacin  hcl], Nitrofuran derivatives, and Wound dressing adhesive  Review of Systems A thorough review of systems was  obtained and all systems are negative except as noted in the HPI and PMH.   Physical Exam Vital Signs  I have  reviewed the triage vital signs BP (!) 150/101   Pulse 95   Temp 98.3 F (36.8 C) (Oral)   Resp 17   SpO2 100%  Physical Exam Vitals and nursing note reviewed.  Constitutional:      General: She is not in acute distress.    Appearance: She is obese.  HENT:     Head: Normocephalic and atraumatic.     Right Ear: External ear normal.     Left Ear: External ear normal.     Nose: Nose normal.     Mouth/Throat:     Mouth: Mucous membranes are moist.  Eyes:     General: No scleral icterus.       Right eye: No discharge.        Left eye: No discharge.  Cardiovascular:     Rate and Rhythm: Regular rhythm. Tachycardia present.     Pulses: Normal pulses.     Heart sounds: Normal heart sounds.  Pulmonary:     Effort: Pulmonary effort is normal. No respiratory distress.     Breath sounds: Normal breath sounds. No stridor.  Abdominal:     General: Abdomen is flat. There is no distension.     Palpations: Abdomen is soft.     Tenderness: There is generalized abdominal tenderness.  Musculoskeletal:     Cervical back: No rigidity.     Right lower leg: No edema.     Left lower leg: No edema.  Skin:    General: Skin is warm and dry.     Capillary Refill: Capillary refill takes less than 2 seconds.  Neurological:     Mental Status: She is alert.  Psychiatric:        Mood and Affect: Mood normal.        Behavior: Behavior normal. Behavior is cooperative.     ED Results and Treatments Labs (all labs ordered are listed, but only abnormal results are displayed) Labs Reviewed  CBC WITH DIFFERENTIAL/PLATELET - Abnormal; Notable for the following components:      Result Value   WBC 14.3 (*)    MCV 78.7 (*)    Platelets 640 (*)    Neutro Abs 11.0 (*)    Monocytes Absolute 1.1 (*)    All other components within normal limits  COMPREHENSIVE METABOLIC PANEL WITH GFR - Abnormal; Notable for the following components:   Potassium 3.4 (*)    CO2 21 (*)    Glucose, Bld 182 (*)    Albumin   3.3 (*)    AST 14 (*)    All other components within normal limits  CBG MONITORING, ED - Abnormal; Notable for the following components:   Glucose-Capillary 180 (*)    All other components within normal limits  LIPASE, BLOOD  URINALYSIS, ROUTINE W REFLEX MICROSCOPIC  Radiology CT ABDOMEN PELVIS WO CONTRAST Result Date: 06/15/2023 CLINICAL DATA:  Acute nonlocalized abdominal pain EXAM: CT ABDOMEN AND PELVIS WITHOUT CONTRAST TECHNIQUE: Multidetector CT imaging of the abdomen and pelvis was performed following the standard protocol without IV contrast. RADIATION DOSE REDUCTION: This exam was performed according to the departmental dose-optimization program which includes automated exposure control, adjustment of the mA and/or kV according to patient size and/or use of iterative reconstruction technique. COMPARISON:  05/28/2023 FINDINGS: Lower Chest: Coronary artery calcifications.  Lung bases are clear. Hepatobiliary: Normal hepatic contours. No intra- or extrahepatic biliary dilatation. Normal gallbladder. Pancreas: Normal pancreas. No ductal dilatation or peripancreatic fluid collection. Spleen: Normal. Adrenals/Urinary Tract: Absent right adrenal gland. Normal left. No hydronephrosis, nephroureterolithiasis or solid renal mass. The urinary bladder is normal for degree of distention Stomach/Bowel: Small sliding hiatal hernia. Normal duodenal course and caliber. No small bowel dilatation or inflammation. Intermediate sized stool ball in the rectum. No focal colonic abnormality or inflammation. Normal appendix. Vascular/Lymphatic: There is calcific atherosclerosis of the abdominal aorta. No lymphadenopathy. Reproductive: Status post hysterectomy. No adnexal mass. Other: None Musculoskeletal: No bony spinal canal stenosis or focal osseous abnormality. IMPRESSION: 1. No acute abnormality of  the abdomen or pelvis. 2. Intermediate sized stool ball in the rectum. 3. Small sliding hiatal hernia. Aortic Atherosclerosis (ICD10-I70.0). Electronically Signed   By: Juanetta Nordmann M.D.   On: 06/15/2023 11:20    Pertinent labs & imaging results that were available during my care of the patient were reviewed by me and considered in my medical decision making (see MDM for details).  Medications Ordered in ED Medications  sodium chloride  0.9 % bolus 1,000 mL (0 mLs Intravenous Stopped 06/15/23 0859)  metoCLOPramide  (REGLAN ) injection 5 mg (5 mg Intravenous Given 06/15/23 0800)  diphenhydrAMINE  (BENADRYL ) injection 25 mg (25 mg Intravenous Given 06/15/23 0759)  metoprolol  tartrate (LOPRESSOR ) injection 5 mg (5 mg Intravenous Given 06/15/23 0757)  pantoprazole  (PROTONIX ) injection 40 mg (40 mg Intravenous Given 06/15/23 0859)  sucralfate  (CARAFATE ) 1 GM/10ML suspension 1 g (1 g Oral Given 06/15/23 0914)  sodium chloride  0.9 % bolus 1,000 mL (0 mLs Intravenous Stopped 06/15/23 1038)  metoprolol  tartrate (LOPRESSOR ) tablet 50 mg (50 mg Oral Given 06/15/23 1044)  HYDROmorphone  (DILAUDID ) injection 0.5 mg (0.5 mg Intravenous Given 06/15/23 1043)                                                                                                                                     Procedures Procedures  (including critical care time)  Medical Decision Making / ED Course    Medical Decision Making:    Shamella Moglia [name] is a 53 y.o. female with past medical history as below, significant for CAD, cva, IDDM, gastroparesis who presents to the ED with complaint of nausea vomiting. The complaint involves an extensive differential diagnosis and also carries with it a high risk of complications and morbidity.  Serious  etiology was considered. Ddx includes but is not limited to: Differential diagnosis includes but is not exclusive to acute cholecystitis, intrathoracic causes for epigastric abdominal  pain, gastritis, duodenitis, pancreatitis, small bowel or large bowel obstruction, abdominal aortic aneurysm, hernia, gastritis, etc.   Complete initial physical exam performed, notably the patient was in mild distress, nausea and vomiting, tachycardia noted.    Reviewed and confirmed nursing documentation for past medical history, family history, social history.  Vital signs reviewed.     Brief summary:  53 year old female history above including gastroparesis, IDDM presenting with nausea vomiting. Ongoing since yesterday evening.  Unrelieved with home Reglan     Clinical Course as of 06/15/23 1536  Sun Jun 15, 2023  2725 Symptoms improved  [SG]  1029 HR improving  [SG]  1202 WBC(!): 14.3 No fever, doubt sepsis, favor 2/2 de-margination from vomiting [SG]  1232 Pt feeling better, hr elevated but improved [SG]  1347 Feeling better, will trial po [SG]    Clinical Course User Index [SG] Teddi Favors, DO     Labs and imaging reviewed, these are stable.  CT imaging stable.  She is feeling much better after antiemetic, heart rate improved after giving her home medication.  She is tolerant p.o. intake without difficulty this time.  No further nausea or vomiting.  Feels back to baseline.  Will give refill on Reglan .  Advised her to take her Protonix  as directed.  Follow-up PCP and GI.  Favor her symptoms today likely secondary to exacerbation of underlying gastroparesis.  Abdominal pain precautions provided  Patient in no distress and overall condition improved here in the ED. Detailed discussions were had with the patient/guardian regarding current findings, and need for close f/u with PCP or on call doctor. The patient/guardian has been instructed to return immediately if the symptoms worsen in any way for re-evaluation. Patient/guardian verbalized understanding and is in agreement with current care plan. All questions answered prior to discharge.              Additional  history obtained: -Additional history obtained from na -External records from outside source obtained and reviewed including: Chart review including previous notes, labs, imaging, consultation notes including  Prior admission Prior ed visits Prior labs   Lab Tests: -I ordered, reviewed, and interpreted labs.   The pertinent results include:   Labs Reviewed  CBC WITH DIFFERENTIAL/PLATELET - Abnormal; Notable for the following components:      Result Value   WBC 14.3 (*)    MCV 78.7 (*)    Platelets 640 (*)    Neutro Abs 11.0 (*)    Monocytes Absolute 1.1 (*)    All other components within normal limits  COMPREHENSIVE METABOLIC PANEL WITH GFR - Abnormal; Notable for the following components:   Potassium 3.4 (*)    CO2 21 (*)    Glucose, Bld 182 (*)    Albumin  3.3 (*)    AST 14 (*)    All other components within normal limits  CBG MONITORING, ED - Abnormal; Notable for the following components:   Glucose-Capillary 180 (*)    All other components within normal limits  LIPASE, BLOOD  URINALYSIS, ROUTINE W REFLEX MICROSCOPIC    Notable for stable labs  EKG   EKG Interpretation Date/Time:  Sunday Jun 15 2023 06:29:21 EDT Ventricular Rate:  146 PR Interval:  115 QRS Duration:  80 QT Interval:  366 QTC Calculation: 571 R Axis:   94  Text Interpretation: Sinus tachycardia Borderline right  axis deviation RSR' in V1 or V2, probably normal variant Probable LVH with secondary repol abnrm Anterior ST elevation, probably due to LVH Prolonged QT interval Baseline wander in lead(s) V5 Confirmed by Abby Hocking [phone]) on 06/15/2023 6:32:03 AM         Imaging Studies ordered: I ordered imaging studies including ctap I independently visualized the following imaging with scope of interpretation limited to determining acute life threatening conditions related to emergency care; findings noted above I agree with the radiologist interpretation If any imaging was obtained with contrast  I closely monitored patient for any possible adverse reaction a/w contrast administration in the emergency department   Medicines ordered and prescription drug management: Meds ordered this encounter  Medications   sodium chloride  0.9 % bolus 1,000 mL   metoCLOPramide  (REGLAN ) injection 5 mg   diphenhydrAMINE  (BENADRYL ) injection 25 mg   metoprolol  tartrate (LOPRESSOR ) injection 5 mg   pantoprazole  (PROTONIX ) injection 40 mg   sucralfate  (CARAFATE ) 1 GM/10ML suspension 1 g   sodium chloride  0.9 % bolus 1,000 mL   metoprolol  tartrate (LOPRESSOR ) tablet 50 mg   HYDROmorphone  (DILAUDID ) injection 0.5 mg   metoCLOPramide  (REGLAN ) 10 MG tablet    Sig: Take 1 tablet (10 mg total) by mouth every 6 (six) hours.    Dispense:  30 tablet    Refill:  0    -I have reviewed the patients home medicines and have made adjustments as needed   Consultations Obtained: na   Cardiac Monitoring: The patient was maintained on a cardiac monitor.  I personally viewed and interpreted the cardiac monitored which showed an underlying rhythm of: sinus tachy > NSR Continuous pulse oximetry interpreted by myself, 100% on ra.    Social Determinants of Health:  Diagnosis or treatment significantly limited by social determinants of health: na   Reevaluation: After the interventions noted above, I reevaluated the patient and found that they have improved  Co morbidities that complicate the patient evaluation  Past Medical History:  Diagnosis Date   Acute bronchitis 08/24/2021   Acute cystitis 12/26/2022   Acute metabolic encephalopathy 11/04/2022   Acute renal failure (ARF) (HCC) 04/27/2023   AKI (acute kidney injury) (HCC) 04/30/2022   Aspiration pneumonia (HCC) 10/13/2022   CAD (coronary artery disease)    Cerebral infarction, unspecified (HCC) 09/27/2012   Chest pain of uncertain etiology 10/06/2022   Depression    Diabetes mellitus without complication (HCC)    Diabetic hyperosmolar non-ketotic  state (HCC) 01/19/2023   DKA (diabetic ketoacidosis) (HCC) 02/19/2021   History of CT scan    History of mammogram    History of MRI    Hypertension    Pheochromocytoma    Reversible cerebrovascular vasoconstriction syndrome    Spell of abnormal behavior    Stroke [facility])    Thyroid  disease       Dispostion: Disposition decision including need for hospitalization was considered, and patient discharged from emergency department.    Final Clinical Impression(s) / ED Diagnoses Final diagnoses:  Nausea and vomiting, unspecified vomiting type  Gastroparesis        Teddi Favors, DO 06/15/23 1536

## 2023-06-15 NOTE — ED Notes (Signed)
 Patient transported to CT

## 2023-06-15 NOTE — ED Triage Notes (Signed)
 Patient coming from with nausea and vomiting. Patient has chronic N/V due to history of gastroparesis. Recently had zofran  discontinued. 2 episodes of emesis with EMS EMS VS 172/122 BP 130-150 HR 224 CBG 79F temp 98% RA

## 2023-06-17 ENCOUNTER — Encounter: Admitting: Family

## 2023-06-20 DIAGNOSIS — E119 Type 2 diabetes mellitus without complications: Secondary | ICD-10-CM | POA: Diagnosis not present

## 2023-06-20 NOTE — Progress Notes (Signed)
   This encounter was created in error - please disregard. No show

## 2023-06-23 ENCOUNTER — Telehealth: Payer: Self-pay | Admitting: *Deleted

## 2023-06-23 ENCOUNTER — Encounter: Admitting: Family

## 2023-06-23 ENCOUNTER — Other Ambulatory Visit: Payer: Self-pay | Admitting: Family

## 2023-06-23 NOTE — Telephone Encounter (Signed)
 Patient has requested refill on medications that have not been refilled by PCP Ngetich, Dinah C, NP . Message routed to PCP for approval.

## 2023-06-23 NOTE — Telephone Encounter (Signed)
 Patient had an appointment today but was no show.

## 2023-06-23 NOTE — Telephone Encounter (Signed)
 Copied from CRM [id number]. Topic: General - Other >> Lori [name] 19, 2025  1:46 PM Adrianna P wrote: Reason for CRM: Home health pt called, she will be calling aps, because patient is bed bound, and is left home alone for hours. Also, she is out of a lot of medications, that has refills on them.

## 2023-06-23 NOTE — Telephone Encounter (Signed)
 FYI

## 2023-06-23 NOTE — Telephone Encounter (Unsigned)
 Copied from CRM [id number]. Topic: Clinical - Medication Refill >> Jun 23, 2023  1:37 PM Adrianna P wrote: Medication: ranolazine  (RANEXA ) 500 MG 12 hr tablet.  gabapentin  (NEURONTIN ) 100 MG capsule  Has the patient contacted their pharmacy? No (Agent: If no, request that the patient contact the pharmacy for the refill. If patient does not wish to contact the pharmacy document the reason why and proceed with request.) (Agent: If yes, when and what did the pharmacy advise?)  This is the patient's preferred pharmacy:  [facility] [phone] - [place], [place] - [address] AT [facility] [address] [place] [address] Phone: [phone] Fax: [phone]  [facility] - [place], [place] - [address] [place]  [address] Phone: [phone] Fax: [phone]  [facility] - [place]  - [place], [place] - [address] [place] [address] Phone: [phone] Fax: [phone]  [facility] - [place], [place] - [address] [place] [address] Phone: [phone] Fax: [phone]  Melodee Spruce [facility] - [facility] [address] [place] [address] Phone: [phone] Fax: [phone]  [facility] [address] [place] [address] Phone: [phone] Fax: [phone]  Is this the correct pharmacy for this prescription? Yes If no, delete pharmacy and type the correct one.   Has the prescription been filled recently? No  Is the patient out of the medication? Yes  Has the patient been seen for an appointment in the last year OR does the patient have an upcoming appointment? Yes  Can we respond through MyChart? No  Agent: Please be advised that Rx refills may take up to 3 business days. We ask that you follow-up with your pharmacy.

## 2023-06-24 ENCOUNTER — Ambulatory Visit: Payer: Self-pay | Admitting: Family

## 2023-06-24 MED ORDER — RANOLAZINE ER 500 MG PO TB12: 500.0000 mg | ORAL_TABLET | Freq: Two times a day (BID) | ORAL | 0 refills | Status: DC

## 2023-06-24 MED ORDER — GABAPENTIN 100 MG PO CAPS
100.0000 mg | ORAL_CAPSULE | Freq: Three times a day (TID) | ORAL | 1 refills | Status: DC
Start: 2023-06-24 — End: 2023-10-27

## 2023-06-24 NOTE — Telephone Encounter (Signed)
 Mychart message sent to patient, she needs an appointment.

## 2023-06-24 NOTE — Telephone Encounter (Signed)
 Patient called as she states she received insulin  Lispro for the first time ever. Pt states she has never drawn up insulin  and only knows how to use the pen. Pt was given the phone number for [facility] pharmacy to call and speak with the pharmacist. This RN also recommended pt has a nurse visit to learn how to draw the insulin  up. Pt states she can't come in today and the next available nurse visit appointment is Thursday. This RN will send a high priority message to the clinic to follow up with patient at [phone] to discuss this medication and next steps. Pt states she only has one hand so it will be difficult for her. This RN did not want to schedule pt for Thursday as pt needs to start taking this medication today.    Copied from CRM [id number]. Topic: Clinical - Medication Question >> Jun 24, 2023 12:44 PM [name] H wrote: Reason for CRM: Patient called to advise that Insulin  Lispro was sent as a bottle. No needles. She doesn't know how to use this medication. She is bedbound. Please assist.

## 2023-06-25 NOTE — Telephone Encounter (Signed)
 Left a detailed message requesting patient return call to schedule an appointment to discuss concerns

## 2023-06-27 ENCOUNTER — Ambulatory Visit (INDEPENDENT_AMBULATORY_CARE_PROVIDER_SITE_OTHER): Admitting: Family

## 2023-06-27 ENCOUNTER — Encounter: Payer: Self-pay | Admitting: Family

## 2023-06-27 VITALS — BP 124/80 | Temp 97.6°F | Resp 21 | Ht 62.0 in | Wt 155.8 lb

## 2023-06-27 DIAGNOSIS — E876 Hypokalemia: Secondary | ICD-10-CM

## 2023-06-27 DIAGNOSIS — R112 Nausea with vomiting, unspecified: Secondary | ICD-10-CM

## 2023-06-27 DIAGNOSIS — E785 Hyperlipidemia, unspecified: Secondary | ICD-10-CM

## 2023-06-27 DIAGNOSIS — E1169 Type 2 diabetes mellitus with other specified complication: Secondary | ICD-10-CM | POA: Diagnosis not present

## 2023-06-27 DIAGNOSIS — R2681 Unsteadiness on feet: Secondary | ICD-10-CM

## 2023-06-27 DIAGNOSIS — I10 Essential (primary) hypertension: Secondary | ICD-10-CM | POA: Diagnosis not present

## 2023-06-28 LAB — CBC WITH DIFFERENTIAL/PLATELET
Absolute Lymphocytes: 2487 {cells}/uL (ref 850–3900)
Absolute Monocytes: 539 {cells}/uL (ref 200–950)
Basophils Absolute: 46 {cells}/uL (ref 0–200)
Basophils Relative: 0.6 %
Eosinophils Absolute: 231 {cells}/uL (ref 15–500)
Eosinophils Relative: 3 %
HCT: 43.2 % (ref 35.0–45.0)
Hemoglobin: 13.1 g/dL (ref 11.7–15.5)
MCH: 26.4 pg — ABNORMAL LOW (ref 27.0–33.0)
MCHC: 30.3 g/dL — ABNORMAL LOW (ref 32.0–36.0)
MCV: 87.1 fL (ref 80.0–100.0)
MPV: 10.7 fL (ref 7.5–12.5)
Monocytes Relative: 7 %
Neutro Abs: 4397 {cells}/uL (ref 1500–7800)
Neutrophils Relative %: 57.1 %
Platelets: 443 10*3/uL — ABNORMAL HIGH (ref 140–400)
RBC: 4.96 10*6/uL (ref 3.80–5.10)
RDW: 15.1 % — ABNORMAL HIGH (ref 11.0–15.0)
Total Lymphocyte: 32.3 %
WBC: 7.7 10*3/uL (ref 3.8–10.8)

## 2023-06-30 NOTE — Progress Notes (Signed)
   This encounter was created in error - please disregard. No show

## 2023-06-30 NOTE — Progress Notes (Signed)
 Provider: Christean Courts FNP-C  Demonte Dobratz, Elijio Guadeloupe, NP  Patient Care Team: Kalianna Verbeke C, NP as PCP - General (Family Medicine) Thukkani, Arun K, MD as PCP - Cardiology (Cardiology) [facility]  Extended Emergency Contact Information Primary Emergency Contact: [name],[name] Mobile Phone: [phone] Relation: Spouse Preferred language: English Interpreter needed? No Secondary Emergency Contact: [name],[name] Home Phone: [phone] Mobile Phone: [phone] Relation: Daughter Mother: [facility] Phone: [phone] Mobile Phone: [phone]  Code Status: Full code Goals of care: Advanced Directive information    06/27/2023    2:24 PM  Advanced Directives  Does Patient Have a Medical Advance Directive? No  Would patient like information on creating a medical advance directive? No - Patient declined     Chief Complaint  Patient presents with   Follow-up    Hospital follow up    Discussed the use of AI scribe software for clinical note transcription with the patient, who gave verbal consent to proceed.  History of Present Illness   Lori [name] is a 53 year old female with diabetes who presents for follow-up after hospitalization for nausea and vomiting.  She was hospitalized on May 11th for nausea and vomiting. She is feeling much better now and has regained weight. No current nausea or vomiting, and she is able to eat regular meals, although she sometimes desires different foods.  Her blood sugar levels have improved but remain high, with the highest reading being 240 mg/dL, typically occurring in the morning. Previously, her levels would reach 400 mg/dL. Her blood sugar levels are often between 160 and 180 mg/dL. She consumes sugary foods and drinks, such as cheese grubs with added sugar, hot dogs, and Dr. Kathlene Paradise, and does not drink much water . She uses Humalog  insulin , administering 7 units regularly and 10 units when her  blood sugar reaches 200 mg/dL. She has been sent insulin  in a bottle form, which she is unfamiliar with, as she typically uses a pen for administration.  Her potassium levels were noted to be low at 3.4 mEq/L during her emergency room visit. No abdominal pain, constipation, or diarrhea.  She experiences itching in her eyes but denies any redness or pain. She has not had any falls and uses a walker occasionally. No sores on her bottom and no redness under her breast.  Past Medical History:  Diagnosis Date   Acute bronchitis 08/24/2021   Acute cystitis 12/26/2022   Acute metabolic encephalopathy 11/04/2022   Acute renal failure (ARF) (HCC) 04/27/2023   AKI (acute kidney injury) (HCC) 04/30/2022   Aspiration pneumonia (HCC) 10/13/2022   CAD (coronary artery disease)    Cerebral infarction, unspecified (HCC) 09/27/2012   Chest pain of uncertain etiology 10/06/2022   Depression    Diabetes mellitus without complication (HCC)    Diabetic hyperosmolar non-ketotic state (HCC) 01/19/2023   DKA (diabetic ketoacidosis) (HCC) 02/19/2021   History of CT scan    History of mammogram    History of MRI    Hypertension    Pheochromocytoma    Reversible cerebrovascular vasoconstriction syndrome    Spell of abnormal behavior    Stroke (HCC)    Thyroid  disease    Past Surgical History:  Procedure Laterality Date   ABDOMINAL HYSTERECTOMY     BIOPSY  02/06/2023   Procedure: BIOPSY;  Surgeon: Ace Holder, MD;  Location: [facility];  Service: Gastroenterology;;   CESAREAN SECTION     3   CORONARY ARTERY BYPASS GRAFT     ESOPHAGOGASTRODUODENOSCOPY (EGD)  WITH PROPOFOL  N/A 02/06/2023   Procedure: ESOPHAGOGASTRODUODENOSCOPY (EGD) WITH PROPOFOL ;  Surgeon: Ace Holder, MD;  Location: [facility] ENDOSCOPY;  Service: Gastroenterology;  Laterality: N/A;   Right adrenal gland removal for pheochromocytoma Right     Allergies  Allergen Reactions   Asa [Aspirin ] Anaphylaxis and Swelling   Contrast  Media [Iodinated Contrast Media] Anaphylaxis and Swelling   Imitrex [Sumatriptan] Anaphylaxis   Ms Contin  [Morphine ] Anaphylaxis and Swelling   Penicillins Swelling and Other (See Comments)    Mouth and eyes swell   Chlorhexidine  Gluconate [Chlorhexidine ] Itching and Rash   Firvanq  [Vancomycin ] Other (See Comments)    Red Man's Syndrome   Cipro  [Ciprofloxacin  Hcl] Nausea And Vomiting   Nitrofuran Derivatives Anxiety   Wound Dressing Adhesive Itching and Rash    Outpatient Encounter Medications as of 06/27/2023  Medication Sig   acetaminophen  (TYLENOL ) 325 MG tablet Take 2 tablets (650 mg total) by mouth every 6 (six) hours as needed for mild pain (pain score 1-3) (or Fever >/= 101).   albuterol  (PROVENTIL ) (2.5 MG/3ML) 0.083% nebulizer solution Take 3 mLs (2.5 mg total) by nebulization every 6 (six) hours as needed for wheezing or shortness of breath.   albuterol  (VENTOLIN  HFA) 108 (90 Base) MCG/ACT inhaler INHALE 2 PUFFS BY MOUTH EVERY 6 HOURS AS NEEDED FOR WHEEZING AND FOR SHORTNESS OF BREATH Strength: 108 (90 Base) MCG/ACT   atorvastatin  (LIPITOR ) 80 MG tablet TAKE 1 TABLET (80 MG TOTAL) BY MOUTH DAILY. (Patient taking differently: Take 80 mg by mouth every evening.)   baclofen  (LIORESAL ) 10 MG tablet Take 1 tablet (10 mg total) by mouth 3 (three) times daily.   clopidogrel  (PLAVIX ) 75 MG tablet TAKE 1 TABLET BY MOUTH DAILY   Continuous Glucose Sensor (FREESTYLE LIBRE 14 DAY SENSOR) MISC 1 each by Does not apply route as needed. DX: E11.69   cyclobenzaprine  (FLEXERIL ) 5 MG tablet Take 1 tablet (5 mg total) by mouth 3 (three) times daily as needed for muscle spasms.   escitalopram  (LEXAPRO ) 20 MG tablet Take 1 tablet (20 mg total) by mouth every morning.   gabapentin  (NEURONTIN ) 100 MG capsule Take 1 capsule (100 mg total) by mouth 3 (three) times daily.   HUMALOG  KWIKPEN 100 UNIT/ML KwikPen INJECT 4 UNITS SUBCUTANEOUSLY THREE TIMES A DAY WITH MEALS (Patient taking differently: Inject 7  Units into the skin with breakfast, with lunch, and with evening meal.)   insulin  glargine (LANTUS  SOLOSTAR) 100 UNIT/ML Solostar Pen Inject 10 Units into the skin at bedtime.   methimazole  (TAPAZOLE ) 5 MG tablet TAKE 1 TABLET BY MOUTH EVERY  MORNING   metoCLOPramide  (REGLAN ) 10 MG tablet Take 1 tablet (10 mg total) by mouth every 6 (six) hours.   metoprolol  tartrate (LOPRESSOR ) 50 MG tablet Take 50 mg by mouth 2 (two) times daily.   olmesartan -hydrochlorothiazide  (BENICAR  HCT) 40-25 MG tablet Take 1 tablet by mouth daily.   pantoprazole  (PROTONIX ) 40 MG tablet Take 1 tablet (40 mg total) by mouth 2 (two) times daily.   ranolazine  (RANEXA ) 500 MG 12 hr tablet Take 1 tablet (500 mg total) by mouth 2 (two) times daily.   rivaroxaban  (XARELTO ) 10 MG TABS tablet TAKE 1 TABLET BY MOUTH DAILY   senna-docusate (SENOKOT-S) 8.6-50 MG tablet Take 1 tablet by mouth 2 (two) times daily.   tamsulosin  (FLOMAX ) 0.4 MG CAPS capsule Take 1 capsule (0.4 mg total) by mouth at bedtime.   topiramate  (TOPAMAX ) 25 MG tablet TAKE 1 TABLET BY MOUTH TWICE DAILY   diclofenac  Sodium (  VOLTAREN ) 1 % GEL Apply 1 Application topically 4 (four) times daily as needed (pain). (Patient not taking: Reported on 06/27/2023)   ezetimibe  (ZETIA ) 10 MG tablet TAKE 1 TABLET BY MOUTH DAILY (Patient not taking: Reported on 06/27/2023)   ferrous sulfate  325 (65 FE) MG tablet Take 1 tablet (325 mg total) by mouth daily with breakfast. (Patient not taking: Reported on 06/27/2023)   metoCLOPramide  (REGLAN ) 10 MG tablet Take 1 tablet (10 mg total) by mouth every 6 (six) hours as needed for refractory nausea / vomiting. (Patient not taking: Reported on 06/27/2023)   [DISCONTINUED] insulin  lispro (HUMALOG ) 100 UNIT/ML injection Sliding scale AC, HS CBG 121 - 150: 2 units CBG 151 - 200: 3 units CBG 201 - 250: 5 units CBG 251 - 300: 8 units CBG 301 - 350: 11 units CBG 351 - 400: 15 units CBG > 400: call MD (Patient not taking: Reported on 06/27/2023)   No  facility-administered encounter medications on file as of 06/27/2023.    Review of Systems  Constitutional:  Negative for appetite change, chills, fatigue, fever and unexpected weight change.  HENT:  Negative for congestion, dental problem, ear discharge, ear pain, facial swelling, hearing loss, nosebleeds, postnasal drip, rhinorrhea, sinus pressure, sinus pain, sneezing, sore throat, tinnitus and trouble swallowing.   Eyes:  Negative for pain, discharge, redness, itching and visual disturbance.  Respiratory:  Negative for cough, chest tightness, shortness of breath and wheezing.   Cardiovascular:  Negative for chest pain, palpitations and leg swelling.  Gastrointestinal:  Negative for abdominal distention, abdominal pain, blood in stool, constipation, diarrhea, nausea and vomiting.  Endocrine: Negative for cold intolerance, heat intolerance, polydipsia, polyphagia and polyuria.  Genitourinary:  Negative for difficulty urinating, dysuria, flank pain, frequency and urgency.  Musculoskeletal:  Negative for arthralgias, back pain, gait problem, joint swelling, myalgias, neck pain and neck stiffness.  Skin:  Negative for color change, pallor, rash and wound.  Neurological:  Negative for dizziness, syncope, speech difficulty, weakness, light-headedness, numbness and headaches.  Hematological:  Does not bruise/bleed easily.  Psychiatric/Behavioral:  Negative for agitation, behavioral problems, confusion, hallucinations, self-injury, sleep disturbance and suicidal ideas. The patient is not nervous/anxious.     Immunization History  Administered Date(s) Administered   Influenza-Unspecified 11/04/2016   PPD Test 03/19/2016   Pfizer(Comirnaty)Fall Seasonal Vaccine 12 years and older 09/29/2019, 03/31/2020   Tdap 02/07/2014   Pertinent  Health Maintenance Due  Topic Date Due   FOOT EXAM  Never done   Colonoscopy  Never done   MAMMOGRAM  Never done   INFLUENZA VACCINE  09/05/2023   HEMOGLOBIN A1C   10/26/2023   OPHTHALMOLOGY EXAM  02/19/2024      12/24/2022   10:28 AM 12/31/2022    8:49 AM 04/03/2023    2:19 PM 05/15/2023   10:48 AM 06/13/2023   10:06 AM  Fall Risk  Falls in the past year? 0 1 0 0 0  Was there an injury with Fall? 0 0 0 0 0  Fall Risk Category Calculator 0 1 0 0 0  Patient at Risk for Falls Due to  History of fall(s) Impaired balance/gait;Impaired mobility Impaired balance/gait;Impaired mobility   Fall risk Follow up  Falls evaluation completed Falls evaluation completed Falls evaluation completed    Functional Status Survey:    Vitals:   06/27/23 1247  BP: 124/80  Resp: (!) 21  Temp: 97.6 F (36.4 C)  SpO2: 99%  Weight: 155 lb 12.8 oz (70.7 kg)  Height: 5\' 2"  (1.575  m)   Body mass index is 28.5 kg/m. Physical Exam VITALS: T- 97.6, P- 95, BP- 124/80, SaO2- 99% MEASUREMENTS: Weight- 155. GENERAL: Alert, cooperative, well developed, no acute distress. HEENT: Normocephalic, normal oropharynx, moist mucous membranes. Pupils equal, round, and reactive to light. CHEST: Clear to auscultation bilaterally. No wheezes, rhonchi, or crackles. CARDIOVASCULAR: Normal heart rate and rhythm, S1 and S2 normal without murmurs. BREAST: No redness under the breast. ABDOMEN: Soft, non-tender, non-distended, without organomegaly. Normal bowel sounds. EXTREMITIES: No cyanosis, edema, or swelling in the legs. NEUROLOGICAL: Cranial nerves grossly intact. Moves all extremities without gross motor or sensory deficit. SKIN: No sores on the skin.   Labs reviewed: Recent Labs    12/02/22 0530 12/05/22 0500 01/02/23 0440 01/03/23 1104 04/29/23 0519 04/30/23 0520 05/11/23 0851 05/12/23 0529 05/13/23 0453 05/15/23 1126 06/09/23 0304 06/10/23 0410 06/15/23 0744  NA 136   < > 130*   < > 144   < > 135   < > 131*   < > 141 136 140  K 4.0   < > 3.3*   < > 3.8   < > 2.9*   < > 3.2*   < > 3.9 3.7 3.4*  CL 105   < > 97*   < > 110   < > 102   < > 102   < > 108 108 107  CO2 23    < > 20*   < > 25   < > 22   < > 23   < > 21* 21* 21*  GLUCOSE 196*   < > 235*   < > 103*   < > 204*   < > 180*   < > 113* 166* 182*  BUN 20   < > 8   < > 25*   < > 10   < > 10   < > <5* 6 6  CREATININE 0.94   < > 0.77   < > 1.46*   < > 0.72   < > 0.80   < > 0.84 0.80 0.78  CALCIUM  8.9   < > 8.5*   < > 9.1   < > 8.4*   < > 7.9*   < > 8.8* 8.2* 9.4  MG 1.7  --  1.7   < > 1.7  --  1.4*  --  1.7  --   --   --   --   PHOS 4.1  --  3.3  --  3.5  --   --   --   --   --   --   --   --    < > = values in this interval not displayed.   Recent Labs    06/09/23 0304 06/10/23 0410 06/15/23 0744  AST 11* 11* 14*  ALT 11 11 11   ALKPHOS 57 56 60  BILITOT 0.5 0.4 0.6  PROT 5.5* 5.4* 6.9  ALBUMIN  2.4* 2.3* 3.3*   Recent Labs    06/02/23 2328 06/04/23 0342 06/10/23 0410 06/15/23 0744 06/27/23 1459  WBC 11.3*   < > 7.3 14.3* 7.7  NEUTROABS 10.5*  --   --  11.0* 4,397  HGB 11.1*   < > 9.6* 12.2 13.1  HCT 33.3*   < > 28.5* 36.6 43.2  MCV 81.0   < > 80.3 78.7* 87.1  PLT 348   < > 447* 640* 443*   < > = values in this interval not displayed.   Lab  Results  Component Value Date   TSH 0.416 06/03/2023   Lab Results  Component Value Date   HGBA1C 10.0 (H) 04/25/2023   Lab Results  Component Value Date   CHOL 273 (H) 04/25/2023   HDL 55 04/25/2023   LDLCALC 187 (H) 04/25/2023   TRIG 165 (H) 04/25/2023   CHOLHDL 5.0 (H) 04/25/2023    Significant Diagnostic Results in last 30 days:  CT ABDOMEN PELVIS WO CONTRAST Result Date: 06/15/2023 CLINICAL DATA:  Acute nonlocalized abdominal pain EXAM: CT ABDOMEN AND PELVIS WITHOUT CONTRAST TECHNIQUE: Multidetector CT imaging of the abdomen and pelvis was performed following the standard protocol without IV contrast. RADIATION DOSE REDUCTION: This exam was performed according to the departmental dose-optimization program which includes automated exposure control, adjustment of the mA and/or kV according to patient size and/or use of iterative  reconstruction technique. COMPARISON:  05/28/2023 FINDINGS: Lower Chest: Coronary artery calcifications.  Lung bases are clear. Hepatobiliary: Normal hepatic contours. No intra- or extrahepatic biliary dilatation. Normal gallbladder. Pancreas: Normal pancreas. No ductal dilatation or peripancreatic fluid collection. Spleen: Normal. Adrenals/Urinary Tract: Absent right adrenal gland. Normal left. No hydronephrosis, nephroureterolithiasis or solid renal mass. The urinary bladder is normal for degree of distention Stomach/Bowel: Small sliding hiatal hernia. Normal duodenal course and caliber. No small bowel dilatation or inflammation. Intermediate sized stool ball in the rectum. No focal colonic abnormality or inflammation. Normal appendix. Vascular/Lymphatic: There is calcific atherosclerosis of the abdominal aorta. No lymphadenopathy. Reproductive: Status post hysterectomy. No adnexal mass. Other: None Musculoskeletal: No bony spinal canal stenosis or focal osseous abnormality. IMPRESSION: 1. No acute abnormality of the abdomen or pelvis. 2. Intermediate sized stool ball in the rectum. 3. Small sliding hiatal hernia. Aortic Atherosclerosis (ICD10-I70.0). Electronically Signed   By: Juanetta Nordmann M.D.   On: 06/15/2023 11:20   DG CHEST PORT 1 VIEW Result Date: 06/07/2023 CLINICAL DATA:  Pneumonia. EXAM: PORTABLE CHEST 1 VIEW COMPARISON:  Chest radiograph dated 05/10/2023. FINDINGS: Right-sided PICC with tip close to the cavoatrial junction. No focal consolidation, pleural effusion or pneumothorax. Stable cardiac silhouette. Median sternotomy wires and CABG vascular clips no acute osseous pathology. IMPRESSION: No active disease. Electronically Signed   By: Angus Bark M.D.   On: 06/07/2023 18:04   DG Abd 1 View Result Date: 06/05/2023 CLINICAL DATA:  [id number] Abdominal pain [id number]. Diabetes. Gastroparesis. Abdominal pain. EXAM: ABDOMEN - 1 VIEW COMPARISON:  CT abdomen pelvis 05/28/2023, CT abdomen pelvis  04/27/2023 FINDINGS: Limited evaluation due to overlapping osseous structures and overlying soft tissues. The bowel gas pattern is normal. Round 6 mm calcification overlying the right upper quadrant likely related to pancreas given prior CTs. Surgical clips overlie the upper abdomen. IMPRESSION: Nonobstructive bowel gas pattern. Electronically Signed   By: Morgane  Naveau M.D.   On: 06/05/2023 19:42   US  EKG SITE RITE Result Date: 06/02/2023 If Site Rite image not attached, placement could not be confirmed due to current cardiac rhythm.   Assessment/Plan   Type 2 diabetes mellitus with hyperglycemia Blood glucose levels have improved but remain elevated, with a recent high of 240 mg/dL, particularly in the morning. Dietary habits include consumption of sugary foods and drinks, contributing to hyperglycemia. Current insulin  regimen includes Humalog , with adjustments based on blood glucose readings. The insulin  vial sent was not appropriate; a pen is preferred for ease of administration. - Educate on reducing sugary food and drink intake. - Encourage portion control, especially with high glycemic index foods like white rice and potatoes. - Advise increased  water  intake to help manage blood glucose levels. - Send prescription for insulin  pen to pharmacy to replace vial. - Continue current Humalog  regimen with adjustments as needed.  Hypertension  -Blood pressure well-controlled -Dietary modification advised - continue on current medication   Hypokalemia Potassium level was low at 3.4 mEq/L during recent emergency room visit. Potassium supplementation may have been administered in the hospital. Re-evaluation of potassium levels is necessary to ensure normalization. - Order blood work to recheck potassium levels.  Nausea and vomiting Nausea and vomiting have resolved. Previous episodes may have been related to hyperglycemia, as high blood glucose levels can induce nausea.  unsteady gait  She  expressed a need for home health physical therapy. No recent falls reported. [facility] is the preferred company for physical therapy services. - Order home health physical therapy with [facility].   Family/ staff Communication: Reviewed plan of care with patient and husband verbalized understanding  Labs/tests ordered:  - CBC/diff  - BMP with GFR  Next Appointment: Return if symptoms worsen or fail to improve.  Total time: 30 minutes. Greater than 50% of total time spent doing patient education regarding T2DM,unsteady gait,nausea and vomiting Hypokalemia,health maintenance including symptom/medication management.   Estil Heman, NP

## 2023-07-01 ENCOUNTER — Other Ambulatory Visit: Payer: Self-pay | Admitting: Family

## 2023-07-01 ENCOUNTER — Encounter: Payer: Self-pay | Admitting: Endocrinology

## 2023-07-01 ENCOUNTER — Ambulatory Visit (INDEPENDENT_AMBULATORY_CARE_PROVIDER_SITE_OTHER): Admitting: Endocrinology

## 2023-07-01 VITALS — BP 118/80 | HR 80 | Resp 20 | Ht 62.0 in | Wt 155.0 lb

## 2023-07-01 DIAGNOSIS — E785 Hyperlipidemia, unspecified: Secondary | ICD-10-CM | POA: Diagnosis not present

## 2023-07-01 DIAGNOSIS — Z86018 Personal history of other benign neoplasm: Secondary | ICD-10-CM

## 2023-07-01 DIAGNOSIS — E1169 Type 2 diabetes mellitus with other specified complication: Secondary | ICD-10-CM

## 2023-07-01 DIAGNOSIS — Z794 Long term (current) use of insulin: Secondary | ICD-10-CM | POA: Diagnosis not present

## 2023-07-01 DIAGNOSIS — E1165 Type 2 diabetes mellitus with hyperglycemia: Secondary | ICD-10-CM | POA: Diagnosis not present

## 2023-07-01 DIAGNOSIS — E0590 Thyrotoxicosis, unspecified without thyrotoxic crisis or storm: Secondary | ICD-10-CM | POA: Diagnosis not present

## 2023-07-01 MED ORDER — METHIMAZOLE 5 MG PO TABS: 5.0000 mg | ORAL_TABLET | Freq: Every morning | ORAL | 3 refills | Status: AC

## 2023-07-01 MED ORDER — LANTUS SOLOSTAR 100 UNIT/ML ~~LOC~~ SOPN: 15.0000 [IU] | PEN_INJECTOR | Freq: Every day | SUBCUTANEOUS | 5 refills | Status: DC

## 2023-07-01 MED ORDER — HUMALOG KWIKPEN 100 UNIT/ML ~~LOC~~ SOPN
PEN_INJECTOR | SUBCUTANEOUS | 5 refills | Status: DC
Start: 2023-07-01 — End: 2023-07-30

## 2023-07-01 NOTE — Patient Instructions (Addendum)
 Diabetes regimen  Lantus  to 15 units daily.  Humalog  adjust 5 to 10 units based on meal size, smaller meal 5 units and larger meal up to 10 units, up to 3 times a day.  Take Humalog  before eating.   Continue current dose of methimazole  for thyroid /hyperthyroidism.  Bring the phone in follow-up visit to download and review your continuous glucose monitoring data.

## 2023-07-01 NOTE — Progress Notes (Signed)
 Outpatient Endocrinology Note Lori Laderrick Wilk, MD   Patient's Name: Lori [name] [facility] Lori [name]    DOB: 10-Feb-1970    MRN: [id number]                                                    REASON OF VISIT: New consult for type 2 diabetes mellitus / hyperthyroidism  REFERRING PROVIDER: Ngetich, Elijio Guadeloupe, NP  PCP: Ngetich, Elijio Guadeloupe, NP  HISTORY OF PRESENT ILLNESS:   Lori [name] [name] is a 53 y.o. old female with past medical history listed below, is here for new consult for type 2 diabetes mellitus.   Pertinent Diabetes History: Patient is referred to endocrinology for evaluation and management of type 2 diabetes mellitus and hyperthyroidism.  Initial consult in Jul 01, 2023.  Limited history from patient and patient's husband in the clinic today.  Medical records reviewed from the chart and also from Care Everywhere.  Patient was known to have type 2 diabetes mellitus uncontrolled prior to 2019, she had follow-up with Soni Srivastav, MD [place], [place] , at that time she was on relatively high dose of basal and bolus insulin  regimen.  Patient denies taking non-insulin  diabetic oral medications in the past.  Based on prior records patient has history of multiple DKA.  There are also concern of possible type 1 diabetes mellitus, no records available to review. Patient was hospitalized due to diabetes ketoacidosis in beginning of April 2025.  Family h/o diabetes mellitus: Mother had type 2 diabetes mellitus.  She has uncontrolled type 2 diabetes mellitus with hemoglobin A1c in the range of 8.5 to 10% range.  Hemoglobin A1c in March 2025 was 10%.  Chronic Diabetes Complications : Retinopathy: Unknown. Last ophthalmology exam was done on ? Due.  Nephropathy: unknown, on ACE/ARB / olmesartan  Peripheral neuropathy: ? Coronary artery disease: yes s/p CABG Stroke: yes, ? two times including in 2022, left hemiparesis She has gastroparesis.  Relevant comorbidities and  cardiovascular risk factors: Obesity: no Body mass index is 28.35 kg/m.  Hypertension: Yes  Hyperlipidemia : Yes, on statin   Current / Home Diabetic regimen includes:  Lantus  10 units at bedtime. Humalog  7-10 units upto 3 times a day with meals.   Prior diabetic medications: Ozempic  in the past, was stopped due to nausea and pain abdomen, started in August 2024.  Glycemic data:   No glucose data to review.  Patient has CGM /freestyle libre forget to bring the phone today not able to download and review.  Hypoglycemia: Patient has no hypoglycemic episodes. Patient has hypoglycemia awareness  Factors modifying glucose control: 1.  Diabetic diet assessment: 3 meals a day.  2.  Staying active or exercising: Not able to exercise.  3.  Medication compliance: compliant all of the time.  # Hyperthyroidism : - Patient was diagnosed with hyperthyroidism based on labs while she was hospitalized in December 2022 with mildly low TSH, mildly elevated free T4 and started on methimazole  5 mg daily in December 2022.  Based on hospital note at that time she did not have prior history of thyroid  disorder, not on thyroid  medication.  She had CT with IV iodine contrast at that time in January 19, 2021 however they were done after thyroid  lab on the same day.  CT neck with unremarkable thyroid .  She did not have ultrasound thyroid  in the  past.  She had tachycardia at the time of diagnosis of hyperthyroidism and was started on beta-blocker metoprolol  as well.  No thyroid  autoantibodies results available to review.   Latest Reference Range & Units 01/19/21 07:43 01/20/21 07:09  TSH 0.350 - 4.500 uIU/mL 0.257 (L)   Triiodothyronine,Free,Serum 2.0 - 4.4 pg/mL  2.7  T4,Free(Direct) 0.61 - 1.12 ng/dL  6.29 (H)  (L): Data is abnormally low (H): Data is abnormally high  She had normal thyroid  function test on methimazole , mostly on methimazole  5 mg daily however was increased up to 10 mg daily in the past  when free T4 was elevated.  Lately patient has been taking methimazole  5 mg daily.  # H/o right sided pheochromocytoma s/p resection , ? 2018, patient reports was done in [place] .  While she was following with endocrinology in [place]  plasma metanephrine was monitor in 2019.  No results available to review.  CT abdomen in Jun 15, 2023 showed absent right adrenal gland.  Normal left adrenal gland.  Interval history  Patient presented to establish endocrinology care for known history of type 2 diabetes mellitus, and hyperthyroidism.  Per chart review noted that she has history of pheochromocytoma status post right adrenalectomy.  Patient is accompanied by her husband in the clinic today.  Recent laboratory results reviewed.  She has normal renal function.  Hemoglobin A1c was 10% in March 21 of 2025.  Thyroid  function test was normal with normal TSH of 0.416 in June 03, 2023.  She had elevated cholesterol level including LDL 187.  REVIEW OF SYSTEMS As per history of present illness.   PAST MEDICAL HISTORY: Past Medical History:  Diagnosis Date   Acute bronchitis 08/24/2021   Acute cystitis 12/26/2022   Acute metabolic encephalopathy 11/04/2022   Acute renal failure (ARF) (HCC) 04/27/2023   AKI (acute kidney injury) (HCC) 04/30/2022   Aspiration pneumonia (HCC) 10/13/2022   CAD (coronary artery disease)    Cerebral infarction, unspecified (HCC) 09/27/2012   Chest pain of uncertain etiology 10/06/2022   Depression    Diabetes mellitus without complication (HCC)    Diabetic hyperosmolar non-ketotic state (HCC) 01/19/2023   DKA (diabetic ketoacidosis) (HCC) 02/19/2021   History of CT scan    History of mammogram    History of MRI    Hypertension    Pheochromocytoma    Reversible cerebrovascular vasoconstriction syndrome    Spell of abnormal behavior    Stroke (HCC)    Thyroid  disease     PAST SURGICAL HISTORY: Past Surgical History:  Procedure Laterality Date   ABDOMINAL  HYSTERECTOMY     BIOPSY  02/06/2023   Procedure: BIOPSY;  Surgeon: Ace Holder, MD;  Location: [facility];  Service: Gastroenterology;;   CESAREAN SECTION     3   CORONARY ARTERY BYPASS GRAFT     ESOPHAGOGASTRODUODENOSCOPY (EGD) WITH PROPOFOL  N/A 02/06/2023   Procedure: ESOPHAGOGASTRODUODENOSCOPY (EGD) WITH PROPOFOL ;  Surgeon: Ace Holder, MD;  Location: [facility];  Service: Gastroenterology;  Laterality: N/A;   Right adrenal gland removal for pheochromocytoma Right     ALLERGIES: Allergies  Allergen Reactions   Lenell Query ] Anaphylaxis and Swelling   Contrast Media [Iodinated Contrast Media] Anaphylaxis and Swelling   Imitrex [Sumatriptan] Anaphylaxis   Ms Contin  [Morphine ] Anaphylaxis and Swelling   Penicillins Swelling and Other (See Comments)    Mouth and eyes swell   Chlorhexidine  Gluconate [Chlorhexidine ] Itching and Rash   Firvanq  [Vancomycin ] Other (See Comments)    Red Man's Syndrome   Cipro  [  Ciprofloxacin  Hcl] Nausea And Vomiting   Nitrofuran Derivatives Anxiety   Wound Dressing Adhesive Itching and Rash    FAMILY HISTORY:  Family History  Problem Relation Age of Onset   Hypertension Mother    Diabetes Mother    Atrial fibrillation Mother    Hypertension Father    Heart attack Father    Dementia Father    Diabetes Brother    Brain cancer Brother    Asthma Daughter    Sickle cell anemia Son    Atrial fibrillation Maternal Uncle     SOCIAL HISTORY: Social History   Socioeconomic History   Marital status: Married    Spouse name: Not on file   Number of children: Not on file   Years of education: Not on file   Highest education level: Not on file  Occupational History   Not on file  Tobacco Use   Smoking status: Never   Smokeless tobacco: Never  Vaping Use   Vaping status: Never Used  Substance and Sexual Activity   Alcohol use: Not Currently   Drug use: Never   Sexual activity: Not on file  Other Topics Concern   Not on file   Social History Narrative   Tobacco use, amount per day now: Never   Past tobacco use, amount per day: Never   How many years did you use tobacco: Never   Alcohol use (drinks per week): Not Currently.   Diet: Eat out a lot.    Do you drink/eat things with caffeine : Sweet Tea   Marital status:   Married                               What year were you married? 2000   Do you live in a house, apartment, assisted living, condo, trailer, etc.? House   Is it one or more stories? 1   How many persons live in your home? 2   Do you have pets in your home?( please list) No   Highest Level of education completed? Bachelors Degree.   Current or past profession: Solicitor, [employer]   Do you exercise?   No                               Type and how often?   Do you have a living will? No   Do you have a DNR form?       No                            If not, do you want to discuss one?   Do you have signed POA/HPOA forms?   No                     If so, please bring to you appointment      Do you have any difficulty bathing or dressing yourself? Yes   Do you have any difficulty preparing food or eating? Yes   Do you have any difficulty managing your medications? Yes   Do you have any difficulty managing your finances? No   Do you have any difficulty affording your medications? Yes   Social Drivers of Corporate investment banker Strain: Not on file  Food Insecurity: No Food Insecurity (06/03/2023)   Hunger Vital  Sign    Worried About Programme researcher, broadcasting/film/video in the Last Year: Never true    Ran Out of Food in the Last Year: Never true  Transportation Needs: No Transportation Needs (06/03/2023)   PRAPARE - Administrator, Civil Service (Medical): No    Lack of Transportation (Non-Medical): No  Physical Activity: Not on file  Stress: Not on file  Social Connections: Not on file    MEDICATIONS:  Current Outpatient Medications  Medication Sig  Dispense Refill   acetaminophen  (TYLENOL ) 325 MG tablet Take 2 tablets (650 mg total) by mouth every 6 (six) hours as needed for mild pain (pain score 1-3) (or Fever >/= 101).     albuterol  (PROVENTIL ) (2.5 MG/3ML) 0.083% nebulizer solution Take 3 mLs (2.5 mg total) by nebulization every 6 (six) hours as needed for wheezing or shortness of breath. 75 mL 5   albuterol  (VENTOLIN  HFA) 108 (90 Base) MCG/ACT inhaler INHALE 2 PUFFS BY MOUTH EVERY 6 HOURS AS NEEDED FOR WHEEZING AND FOR SHORTNESS OF BREATH Strength: 108 (90 Base) MCG/ACT 6.7 g 5   atorvastatin  (LIPITOR ) 80 MG tablet Take 1 tablet (80 mg total) by mouth every evening. 80 tablet 2   baclofen  (LIORESAL ) 10 MG tablet Take 1 tablet (10 mg total) by mouth 3 (three) times daily. 90 tablet 1   clopidogrel  (PLAVIX ) 75 MG tablet TAKE 1 TABLET BY MOUTH DAILY 90 tablet 3   Continuous Glucose Sensor (FREESTYLE LIBRE 14 DAY SENSOR) MISC 1 each by Does not apply route as needed. DX: E11.69 2 each 12   cyclobenzaprine  (FLEXERIL ) 5 MG tablet Take 1 tablet (5 mg total) by mouth 3 (three) times daily as needed for muscle spasms. 90 tablet 5   escitalopram  (LEXAPRO ) 20 MG tablet Take 1 tablet (20 mg total) by mouth every morning. 90 tablet 1   ezetimibe  (ZETIA ) 10 MG tablet TAKE 1 TABLET BY MOUTH DAILY 90 tablet 3   gabapentin  (NEURONTIN ) 100 MG capsule Take 1 capsule (100 mg total) by mouth 3 (three) times daily. 90 capsule 1   metoCLOPramide  (REGLAN ) 10 MG tablet Take 1 tablet (10 mg total) by mouth every 6 (six) hours. 30 tablet 0   metoprolol  tartrate (LOPRESSOR ) 50 MG tablet Take 50 mg by mouth 2 (two) times daily.     olmesartan -hydrochlorothiazide  (BENICAR  HCT) 40-25 MG tablet Take 1 tablet by mouth daily.     pantoprazole  (PROTONIX ) 40 MG tablet Take 1 tablet (40 mg total) by mouth 2 (two) times daily. 60 tablet 5   ranolazine  (RANEXA ) 500 MG 12 hr tablet Take 1 tablet (500 mg total) by mouth 2 (two) times daily. 180 tablet 0   rivaroxaban  (XARELTO )  10 MG TABS tablet TAKE 1 TABLET BY MOUTH DAILY 90 tablet 1   senna-docusate (SENOKOT-S) 8.6-50 MG tablet Take 1 tablet by mouth 2 (two) times daily.     tamsulosin  (FLOMAX ) 0.4 MG CAPS capsule Take 1 capsule (0.4 mg total) by mouth at bedtime. 30 capsule 5   topiramate  (TOPAMAX ) 25 MG tablet TAKE 1 TABLET BY MOUTH TWICE DAILY 180 tablet 1   HUMALOG  KWIKPEN 100 UNIT/ML KwikPen 5-10 units with meals 3 times a day, maximum 30 units/day. 15 mL 5   insulin  glargine (LANTUS  SOLOSTAR) 100 UNIT/ML Solostar Pen Inject 15 Units into the skin at bedtime. 15 mL 5   methimazole  (TAPAZOLE ) 5 MG tablet Take 1 tablet (5 mg total) by mouth every morning. 90 tablet 3   No current facility-administered  medications for this visit.    PHYSICAL EXAM: Vitals:   07/01/23 1404  BP: 118/80  Pulse: 80  Resp: 20  SpO2: 98%  Weight: 155 lb (70.3 kg)  Height: 5\' 2"  (1.575 m)   Body mass index is 28.35 kg/m.  Wt Readings from Last 3 Encounters:  07/01/23 155 lb (70.3 kg)  06/27/23 155 lb 12.8 oz (70.7 kg)  06/10/23 172 lb 6.4 oz (78.2 kg)    General: Well developed, well nourished female in no apparent distress.  HEENT: AT/[place], no external lesions.  Eyes: Conjunctiva clear and no icterus. Neck: Neck supple  Lungs: Respirations not labored Neurologic: Alert, oriented, ?  Aphasia.  Left-sided hemiparesis. Extremities / Skin: Dry.  Psychiatric: Does not appear depressed or anxious  Diabetic Foot Exam - Simple   No data filed    LABS Reviewed Lab Results  Component Value Date   HGBA1C 10.0 (H) 04/25/2023   HGBA1C 8.5 (H) 11/24/2022   HGBA1C 9.8 (H) 08/27/2022   No results found for: "FRUCTOSAMINE" Lab Results  Component Value Date   CHOL 273 (H) 04/25/2023   HDL 55 04/25/2023   LDLCALC 187 (H) 04/25/2023   TRIG 165 (H) 04/25/2023   CHOLHDL 5.0 (H) 04/25/2023   No results found for: "MICRALBCREAT" Lab Results  Component Value Date   CREATININE 0.78 06/15/2023   No results found for:  "GFR"  ASSESSMENT / PLAN  1. Type 2 diabetes mellitus with other specified complication, with long-term current use of insulin  (HCC)   2. Type 2 diabetes mellitus with hyperlipidemia (HCC)   3. Hyperthyroidism   4. Uncontrolled type 2 diabetes mellitus with hyperglycemia (HCC)   5. History of pheochromocytoma     Diabetes Mellitus type 2, complicated by CVA, CAD - Diabetic status / severity: Uncontrolled.  Lab Results  Component Value Date   HGBA1C 10.0 (H) 04/25/2023    - Hemoglobin A1c goal : <7%  Patient has uncontrolled type 2 diabetes mellitus.  Discussed about importance of controlling blood sugar to prevent diabetic related complications including diabetic retinopathy, neuropathy and nephropathy.  She has history of stroke with left-sided hemiparesis.  She is currently on multidose insulin  regimen, adjusted as follows.  No glucose data available to review to make significant adjustment on the diabetes regimen.  She has gastroparesis, she used to be on Ozempic  and then was stopped due to worsening nausea and vomiting.  - Medications: See below.  I) increase Lantus  from 10 to 15 units daily. II) adjust Humalog  5 to 10 units based on meal size, smaller meal 5 units and larger meal up to 10 units up to 3 times a day.  Advised to take Humalog  10 to 15 minutes before eating preferably. III) will consider Humalog  sliding scale in addition to above fixed dose of Humalog  in the future. IV) consider non-insulin  regimen in the future.  Patient has history of diabetes ketoacidosis.  Currently on multidose insulin  regimen.  Patient is advised not to miss any of insulin .  Will consider checking type I autoimmune diabetes panel in the next set of lab.  - Home glucose testing: CGM Freestyle libre.  Actual bring phone /receiver to download and review in the follow-up visit. - Discussed/ Gave Hypoglycemia treatment plan.  # Consult : not required at this time.   # Annual urine for  microalbuminuria/ creatinine ratio, no microalbuminuria currently, continue ACE/ARB /olmesartan .  Will check in the future visit. Last No results found for: "MICRALBCREAT"  # Foot check nightly / ?  neuropathy, continue gabapentin , managed by primary care provider.  # Annual dilated diabetic eye exams.  She needs diabetic eye exam.  - Diet: Make healthy diabetic food choices  2. Blood pressure  -  BP Readings from Last 1 Encounters:  07/01/23 118/80    - Control is in target.  - No change in current plans.  3. Lipid status / Hyperlipidemia - Last  Lab Results  Component Value Date   LDLCALC 187 (H) 04/25/2023   - Continue atorvastatin , ezetimibe , managed by primary care provider.  # Hyperthyroidism - Currently on methimazole  5 mg daily.  She was diagnosed with hyperthyroidism in December 2022 when hospitalized.  Recent thyroid  function test with normal TSH. - Etiology of hypothyroidism is unclear at this time.  Will consider checking thyroid  autoantibodies in the future.  Thyroid  was unremarkable in the CT scan in the past. - Continue current dose of methimazole  5 mg daily.  # History of pheochromocytoma, Status post right adrenalectomy in ? 2018. - She had normal left adrenal gland and absence of right adrenal gland on CT scan in May 2025. - Will monitor with annual plasma free metanephrines, as needed 24-hour urine metanephrines.  Will check in the next set of lab.  Diagnoses and all orders for this visit:  Type 2 diabetes mellitus with other specified complication, with long-term current use of insulin  (HCC) -     HUMALOG  KWIKPEN 100 UNIT/ML KwikPen; 5-10 units with meals 3 times a day, maximum 30 units/day.  Type 2 diabetes mellitus with hyperlipidemia (HCC) -     insulin  glargine (LANTUS  SOLOSTAR) 100 UNIT/ML Solostar Pen; Inject 15 Units into the skin at bedtime.  Hyperthyroidism -     methimazole  (TAPAZOLE ) 5 MG tablet; Take 1 tablet (5 mg total) by mouth every  morning.  Uncontrolled type 2 diabetes mellitus with hyperglycemia (HCC)  History of pheochromocytoma    DISPOSITION Follow up in clinic in 4 weeks suggested.   All questions answered and patient verbalized understanding of the plan.  Lori Adilson Grafton, MD [facility] [address] [place], [address] [place], [place] [address] Phone # [phone]  At least part of this note was generated using voice recognition software. Inadvertent word errors may have occurred, which were not recognized during the proofreading process.

## 2023-07-02 ENCOUNTER — Telehealth: Admitting: Family

## 2023-07-02 NOTE — Telephone Encounter (Signed)
 Noted

## 2023-07-02 NOTE — Telephone Encounter (Signed)
 Please call patient to schedule lab appointment for CMP to be redraw.Lab tech reported that blood work clotted on Friday. Encourage patient to drink plenty of water  prior to come for lab draw.

## 2023-07-02 NOTE — Telephone Encounter (Signed)
 Mychart message sent at an avenue of communication an appointment is due. Admin staff may decide to call patient as well

## 2023-07-03 ENCOUNTER — Telehealth: Payer: Self-pay | Admitting: Family

## 2023-07-03 NOTE — Telephone Encounter (Signed)
Tried calling patient. LMOM to return call.  °

## 2023-07-03 NOTE — Telephone Encounter (Signed)
 Staff message received from [place] with [facility] states not able to accept patient back on services due not being safe at home recommends a higher level of care.Please call and notify patient and POA recommendation from [facility].

## 2023-07-04 NOTE — Telephone Encounter (Signed)
Tried calling patient. LMOM to return call.  °

## 2023-07-08 NOTE — Telephone Encounter (Signed)
 Forwarded to Dinah to place referral.

## 2023-07-08 NOTE — Telephone Encounter (Signed)
 Tried calling patient. LMOM to return call.  MyChart message sent to patient.

## 2023-07-08 NOTE — Telephone Encounter (Signed)
 Referral coordinator to forward order for [facility] to [facility] as requested.

## 2023-07-08 NOTE — Telephone Encounter (Addendum)
 Attn: Anita  Pt Lori [name] called back and ask if you could do a referral to [facility]. Since [facility] declined  Please advise

## 2023-07-09 ENCOUNTER — Telehealth: Payer: Self-pay | Admitting: Family

## 2023-07-09 ENCOUNTER — Other Ambulatory Visit: Payer: Self-pay | Admitting: Family

## 2023-07-09 ENCOUNTER — Ambulatory Visit: Payer: Self-pay | Admitting: Family

## 2023-07-09 NOTE — Telephone Encounter (Signed)
 Copied from CRM [id number]. Topic: Referral - Status >> Jul 09, 2023  2:15 PM Shelby Dessert H wrote: Reason for CRM: Jasmine called from [facility] called to inform the provider that they are declining the referral due to staffing, if you have any questions feel freer to call her at [phone].    Please advise on another location for patient

## 2023-07-09 NOTE — Telephone Encounter (Signed)
 Noted

## 2023-07-15 ENCOUNTER — Other Ambulatory Visit: Payer: Self-pay | Admitting: Family

## 2023-07-15 ENCOUNTER — Telehealth: Payer: Self-pay

## 2023-07-15 ENCOUNTER — Telehealth: Payer: Self-pay | Admitting: Family

## 2023-07-15 DIAGNOSIS — E0590 Thyrotoxicosis, unspecified without thyrotoxic crisis or storm: Secondary | ICD-10-CM

## 2023-07-15 NOTE — Telephone Encounter (Signed)
 I called and spoke to [name]. She had her husband read me all the information from all of her insurance coverages. I have updated her chart to reflect: Lori [name] Medicare ID [id number] effective 07/06/23 and her Medicare ID [id number] effective 02/04/2014 and her name as it is printed on the card: Lori [name]. I asked her to please bring her wallet with those cards to her next appointment for the office to scan.

## 2023-07-15 NOTE — Telephone Encounter (Signed)
 Incoming fax received for the coverage of INSULIN  LISP INJ 100/ML.   Patient is under the care of an endocrinologist. I will forward this information to them. (See media)

## 2023-07-15 NOTE — Telephone Encounter (Signed)
 Referral was ordered 06/27/2023 but [facility] declined due to level of care of patient.Order was supposed to be send to another Home health for PT.Please follow up with referral Coordinator.

## 2023-07-15 NOTE — Telephone Encounter (Signed)
 Please see below.   Copied from CRM [id number]. Topic: Referral - Question >> Jul 15, 2023 10:36 AM [name] H wrote: Reason for CRM: Patient called to request that she also needs a referral for physical therapy in addition to a referral for home health.   Patient has been waiting since 07/09/23 for follow up [facility] - now also, patient will need a PT referral for eval. Please assist.

## 2023-07-19 ENCOUNTER — Other Ambulatory Visit: Payer: Self-pay | Admitting: Family

## 2023-07-19 DIAGNOSIS — E1169 Type 2 diabetes mellitus with other specified complication: Secondary | ICD-10-CM

## 2023-07-22 ENCOUNTER — Other Ambulatory Visit: Payer: Self-pay | Admitting: Family

## 2023-07-22 DIAGNOSIS — F32A Depression, unspecified: Secondary | ICD-10-CM

## 2023-07-29 ENCOUNTER — Encounter: Attending: Physical Medicine & Rehabilitation | Admitting: Physical Medicine & Rehabilitation

## 2023-07-29 ENCOUNTER — Telehealth: Payer: Self-pay | Admitting: *Deleted

## 2023-07-29 ENCOUNTER — Encounter: Payer: Self-pay | Admitting: Physical Medicine & Rehabilitation

## 2023-07-29 VITALS — BP 91/71 | HR 135 | Ht 62.0 in

## 2023-07-29 DIAGNOSIS — R252 Cramp and spasm: Secondary | ICD-10-CM | POA: Insufficient documentation

## 2023-07-29 DIAGNOSIS — I69398 Other sequelae of cerebral infarction: Secondary | ICD-10-CM | POA: Insufficient documentation

## 2023-07-29 NOTE — Telephone Encounter (Signed)
 Copied from CRM [id number]. Topic: Clinical - Medication Question >> Jul 29, 2023  3:15 PM Diannia H wrote: Reason for CRM: Patient is wanting the provider to call her in some ambient to the pharmacy as well.    Ambien  is Not in patient's current medication list.  Tried calling patient, LMOM to return call.

## 2023-07-29 NOTE — Progress Notes (Signed)
 Subjective:    Patient ID: Lori [name], female    DOB: 1970/10/22, 53 y.o.   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  She underwent botulinum toxin injection 6 weeks ago with the following muscle groups LEFT Biceps50 2+ Brachialis 75 1+ FCR50 2+ FCU0 FDS50 2+ FDP50 1+ Opponens pollicis 25 2+ Lumbricals 75 0-1+ PT 25 2+ The patient had no postinjection complications.  We discussed her functional status she really does not use the left upper extremity for functional activities.  Main purpose for injections is to prevent contractures to rule out for ADLs.  She would like to restart some home health therapy she had it a couple months ago after a hospitalization for abdominal pain Pain Inventory Average Pain 4 Pain Right Now 4 My pain is no pain  In the last 24 hours, has pain interfered with the following? General activity 4 Relation with others 0 Enjoyment of life 0 What TIME of day is your pain at its worst? morning , daytime, evening, and night Sleep (in general) Poor  Pain is worse with: unsure Pain improves with: therapy/exercise Relief from Meds: na  Family History  Problem Relation Age of Onset   Hypertension Mother    Diabetes Mother    Atrial fibrillation Mother    Hypertension Father    Heart attack Father    Dementia Father    Diabetes Brother    Brain cancer Brother    Asthma Daughter    Sickle cell anemia Son    Atrial fibrillation Maternal Uncle    Social History   Socioeconomic History   Marital status: Married    Spouse name: Not on file   Number of children: Not on file   Years of education: Not on file   Highest education level: Not on file  Occupational History   Not on file  Tobacco Use   Smoking status: Never   Smokeless tobacco: Never  Vaping Use   Vaping status: Never Used  Substance and Sexual Activity   Alcohol use: Not Currently    Drug use: Never   Sexual activity: Not on file  Other Topics Concern   Not on file  Social History Narrative   Tobacco use, amount per day now: Never   Past tobacco use, amount per day: Never   How many years did you use tobacco: Never   Alcohol use (drinks per week): Not Currently.   Diet: Eat out a lot.    Do you drink/eat things with caffeine : Sweet Tea   Marital status:   Married                               What year were you married? 2000   Do you live in a house, apartment, assisted living, condo, trailer, etc.? House   Is it one or more stories? 1   How many persons live in your home? 2   Do you have pets in your home?( please list) No   Highest Level of education completed? Bachelors Degree.   Current or past profession: Solicitor, [employer]   Do you exercise?   No                               Type and how  often?   Do you have a living will? No   Do you have a DNR form?       No                            If not, do you want to discuss one?   Do you have signed POA/HPOA forms?   No                     If so, please bring to you appointment      Do you have any difficulty bathing or dressing yourself? Yes   Do you have any difficulty preparing food or eating? Yes   Do you have any difficulty managing your medications? Yes   Do you have any difficulty managing your finances? No   Do you have any difficulty affording your medications? Yes   Social Drivers of Corporate investment banker Strain: Not on file  Food Insecurity: No Food Insecurity (06/03/2023)   Hunger Vital Sign    Worried About Running Out of Food in the Last Year: Never true    Ran Out of Food in the Last Year: Never true  Transportation Needs: No Transportation Needs (06/03/2023)   PRAPARE - Administrator, Civil Service (Medical): No    Lack of Transportation (Non-Medical): No  Physical Activity: Not on file  Stress: Not on file  Social  Connections: Not on file   Past Surgical History:  Procedure Laterality Date   ABDOMINAL HYSTERECTOMY     BIOPSY  02/06/2023   Procedure: BIOPSY;  Surgeon: Leigh Elspeth SQUIBB, MD;  Location: [facility];  Service: Gastroenterology;;   CESAREAN SECTION     3   CORONARY ARTERY BYPASS GRAFT     ESOPHAGOGASTRODUODENOSCOPY (EGD) WITH PROPOFOL  N/A 02/06/2023   Procedure: ESOPHAGOGASTRODUODENOSCOPY (EGD) WITH PROPOFOL ;  Surgeon: Leigh Elspeth SQUIBB, MD;  Location: [facility];  Service: Gastroenterology;  Laterality: N/A;   Right adrenal gland removal for pheochromocytoma Right    Past Surgical History:  Procedure Laterality Date   ABDOMINAL HYSTERECTOMY     BIOPSY  02/06/2023   Procedure: BIOPSY;  Surgeon: Leigh Elspeth SQUIBB, MD;  Location: [facility];  Service: Gastroenterology;;   CESAREAN SECTION     3   CORONARY ARTERY BYPASS GRAFT     ESOPHAGOGASTRODUODENOSCOPY (EGD) WITH PROPOFOL  N/A 02/06/2023   Procedure: ESOPHAGOGASTRODUODENOSCOPY (EGD) WITH PROPOFOL ;  Surgeon: Leigh Elspeth SQUIBB, MD;  Location: [facility];  Service: Gastroenterology;  Laterality: N/A;   Right adrenal gland removal for pheochromocytoma Right    Past Medical History:  Diagnosis Date   Acute bronchitis 08/24/2021   Acute cystitis 12/26/2022   Acute metabolic encephalopathy 11/04/2022   Acute renal failure (ARF) (HCC) 04/27/2023   AKI (acute kidney injury) (HCC) 04/30/2022   Aspiration pneumonia (HCC) 10/13/2022   CAD (coronary artery disease)    Cerebral infarction, unspecified (HCC) 09/27/2012   Chest pain of uncertain etiology 10/06/2022   Depression    Diabetes mellitus without complication (HCC)    Diabetic hyperosmolar non-ketotic state (HCC) 01/19/2023   DKA (diabetic ketoacidosis) (HCC) 02/19/2021   History of CT scan    History of mammogram    History of MRI    Hypertension    Pheochromocytoma    Reversible cerebrovascular vasoconstriction syndrome    Spell of abnormal behavior    Stroke  (HCC)    Thyroid  disease    BP 91/71  Pulse (!) 135 Comment: checked radial pulse to confirm  Ht 5' 2 (1.575 m)   SpO2 97%   BMI 28.35 kg/m   Opioid Risk Score:   Fall Risk Score:  `1  Depression screen [facility] 2/9     07/29/2023   10:40 AM 06/13/2023   10:07 AM 05/15/2023   10:48 AM 04/03/2023    2:19 PM 12/24/2022   10:29 AM 11/01/2022    3:05 PM 10/28/2022    1:49 PM  Depression screen PHQ 2/9  Decreased Interest 0 0 0 1 1 0 3  Down, Depressed, Hopeless 0 0 0 0 1 0 1  PHQ - 2 Score 0 0 0 1 2 0 4  Altered sleeping     1  1  Tired, decreased energy     0  1  Change in appetite     0  2  Feeling bad or failure about yourself      0  1  Trouble concentrating     0  0  Moving slowly or fidgety/restless     0  0  Suicidal thoughts     0  0  PHQ-9 Score     3  9  Difficult doing work/chores     Not difficult at all      Review of Systems  Cardiovascular:        HR elevated, BP low but pt denies symptoms  Musculoskeletal:  Positive for gait problem.       Left side  Hematological:  Bruises/bleeds easily.       Plavix  and xarelto  and asa  All other systems reviewed and are negative.      Objective:   Physical Exam Left upper extremity Motor strength is 3 - at the deltoid to minus elbow flexors and extensors 0 at the finger flexors extensors as well as wrist flexors and extensors. Tone MAS 3 in the lumbricals MAS 0 at the wrist flexors MAS 3 at the wrist extensors MAS 0 at the finger flexors MAS 1 at the opponens pollicis MAS 2 at the elbow flexors Left lower extremity strength is 4 - at the knee extensors and ankle dorsiflexors 3 - at the hip flexors Tone in the left lower extremity is normal       Assessment & Plan:  1.  Chronic left spastic hemiplegia affecting the upper limb greater than lower limb.  She has had some improvements with botulinum toxin injections she has had a total of 400 units which is the maximum dosage. She is exhibiting increasing wrist  extensor spasticity therefore that will be addressed rather than the wrist flexor.  Otherwise we will continue the current dosing  LEFT Biceps50 2+ Brachialis 75 1+ ECRL 50 2+  FDS50 2+ FDP50 1+ Opponens pollicis 25 2+ Lumbricals 75 0-1+ PT 25 2+  Patient should be able to improve her mobility and even take a few steps with physical therapy.  Her lower extremity strength is good.  Balance may be a limiting factor.  I do think she may benefit from another round of home health physical therapy. I will see her back for the repeat injection in 6 weeks

## 2023-07-30 ENCOUNTER — Ambulatory Visit: Payer: Self-pay | Admitting: Endocrinology

## 2023-07-30 ENCOUNTER — Encounter: Payer: Self-pay | Admitting: Endocrinology

## 2023-07-30 ENCOUNTER — Ambulatory Visit (INDEPENDENT_AMBULATORY_CARE_PROVIDER_SITE_OTHER): Admitting: Endocrinology

## 2023-07-30 VITALS — BP 120/68 | HR 123 | Resp 20 | Ht 62.0 in | Wt 155.0 lb

## 2023-07-30 DIAGNOSIS — Z86018 Personal history of other benign neoplasm: Secondary | ICD-10-CM | POA: Diagnosis not present

## 2023-07-30 DIAGNOSIS — Z794 Long term (current) use of insulin: Secondary | ICD-10-CM | POA: Diagnosis not present

## 2023-07-30 DIAGNOSIS — E1169 Type 2 diabetes mellitus with other specified complication: Secondary | ICD-10-CM | POA: Diagnosis not present

## 2023-07-30 DIAGNOSIS — E0590 Thyrotoxicosis, unspecified without thyrotoxic crisis or storm: Secondary | ICD-10-CM

## 2023-07-30 DIAGNOSIS — E785 Hyperlipidemia, unspecified: Secondary | ICD-10-CM

## 2023-07-30 LAB — POCT GLYCOSYLATED HEMOGLOBIN (HGB A1C): Hemoglobin A1C: 9 % — AB (ref 4.0–5.6)

## 2023-07-30 MED ORDER — FREESTYLE LIBRE 3 PLUS SENSOR MISC: 1.0000 | 3 refills | Status: AC

## 2023-07-30 MED ORDER — LANTUS SOLOSTAR 100 UNIT/ML ~~LOC~~ SOPN
18.0000 [IU] | PEN_INJECTOR | Freq: Every day | SUBCUTANEOUS | 5 refills | Status: DC
Start: 2023-07-30 — End: 2023-09-08

## 2023-07-30 MED ORDER — HUMALOG KWIKPEN 100 UNIT/ML ~~LOC~~ SOPN: PEN_INJECTOR | SUBCUTANEOUS | 5 refills | Status: DC

## 2023-07-30 NOTE — Telephone Encounter (Signed)
 Ambien  is not on the medication list. Patient needs to be seen for evaluation.

## 2023-07-30 NOTE — Telephone Encounter (Signed)
Message routed to Ella Bodo, NP

## 2023-07-30 NOTE — Patient Instructions (Signed)
 Diabetes regimen  Lantus  to 18 units daily.  Humalog  adjust 10-15 units based on meal size, smaller meal 10 units and larger meal up to 15 units, up to 3 times a day.  Take Humalog  before eating.  Continue current dose of methimazole  for thyroid /hyperthyroidism.

## 2023-07-30 NOTE — Progress Notes (Signed)
 Outpatient Endocrinology Note Lori Charity Tessier, MD   Patient's Name: [name] Lori [name]    DOB: 11-30-1970    MRN: [id number]                                                    REASON OF VISIT: Follow up for type 2 diabetes mellitus / hyperthyroidism   REFERRING PROVIDER: Ngetich, [name], NP  PCP: Ngetich, [name], NP  HISTORY OF PRESENT ILLNESS:   Lori [name] is a 53 y.o. old female with past medical history listed below, is here for follow-up for type 2 diabetes mellitus.  History of pheochromocytoma.  Hyperthyroidism.  Pertinent Diabetes History: Patient is referred to endocrinology for evaluation and management of type 2 diabetes mellitus and hyperthyroidism.  Initial consult in Jul 01, 2023.  Limited history from patient and patient's husband in the clinic today.  Medical records reviewed from the chart and also from Care Everywhere.  Patient was known to have type 2 diabetes mellitus uncontrolled prior to 2019, she had follow-up with Lori Srivastav, MD [place], [place] , at that time she was on relatively high dose of basal and bolus insulin  regimen.  Patient denies taking non-insulin  diabetic oral medications in the past.  Based on prior records patient has history of multiple DKA.  There are also concern of possible type 1 diabetes mellitus, no records available to review. Patient was hospitalized due to diabetes ketoacidosis in beginning of April 2025.  Family h/o diabetes mellitus: Mother had type 2 diabetes mellitus.  She has uncontrolled type 2 diabetes mellitus with hemoglobin A1c in the range of 8.5 to 10% range.  Hemoglobin A1c in March 2025 was 10%.  Chronic Diabetes Complications : Retinopathy: Unknown. Last ophthalmology exam was done on ? Due.  Nephropathy: unknown, on ACE/ARB / olmesartan  Peripheral neuropathy: ? Coronary artery disease: yes s/p CABG Stroke: yes, ? two times including in 2022, left hemiparesis She has  gastroparesis.  Relevant comorbidities and cardiovascular risk factors: Obesity: no Body mass index is 28.35 kg/m.  Hypertension: Yes  Hyperlipidemia : Yes, on statin   Current / Home Diabetic regimen includes:  Lantus  15 units at bedtime. Humalog  10 units upto 3 times a day with meals.   Prior diabetic medications: Ozempic  in the past, was stopped due to nausea and pain abdomen, started in August 2024.  Glycemic data:    CONTINUOUS GLUCOSE MONITORING SYSTEM (CGMS) INTERPRETATION:    FreeStyle Libre CGM-  Sensor Download (Sensor download was reviewed and summarized below.) Dates: June 12 to July 30, 2023, 14 days Sensor Average: 204  Glucose Management Indicator: 8.2%  % data captured: 75%    Impression: - Random hyperglycemia related to high carb meal at different times, around lunchtime and sometimes up at bedtime and suppertime with blood sugar up to 300-350 range.  Blood sugar overnight and in between the meals have been mostly in the range of 150-  180 range.  No hypoglycemia.  Hypoglycemia: Patient has no hypoglycemic episodes. Patient has hypoglycemia awareness  Factors modifying glucose control: 1.  Diabetic diet assessment: 3 meals a day.  2.  Staying active or exercising: Not able to exercise.  3.  Medication compliance: compliant all of the time.  # Hyperthyroidism : - Patient was diagnosed with hyperthyroidism based on labs while she was hospitalized in December  2022 with mildly low TSH, mildly elevated free T4 and started on methimazole  5 mg daily in December 2022.  Based on hospital note at that time she did not have prior history of thyroid  disorder, not on thyroid  medication.  She had CT with IV iodine contrast at that time in January 19, 2021 however they were done after thyroid  lab on the same day.  CT neck with unremarkable thyroid .  She did not have ultrasound thyroid  in the past.  She had tachycardia at the time of diagnosis of hyperthyroidism and was  started on beta-blocker metoprolol  as well.  No thyroid  autoantibodies results available to review.   Latest Reference Range & Units 01/19/21 07:43 01/20/21 07:09  TSH 0.350 - 4.500 uIU/mL 0.257 (L)   Triiodothyronine,Free,Serum 2.0 - 4.4 pg/mL  2.7  T4,Free(Direct) 0.61 - 1.12 ng/dL  8.83 (H)  (L): Data is abnormally low (H): Data is abnormally high  She had normal thyroid  function test on methimazole , mostly on methimazole  5 mg daily however was increased up to 10 mg daily in the past when free T4 was elevated.  Lately patient has been taking methimazole  5 mg daily.  # H/o right sided pheochromocytoma s/p resection , ? 2018, patient reports was done in [place] .  While she was following with endocrinology in [place]  plasma metanephrine was monitor in 2019.  No results available to review.  CT abdomen in Jun 15, 2023 showed absent right adrenal gland.  Normal left adrenal gland.  Interval history  Freestyle libre CGM data as reviewed above.  She has improvement on diabetes with hemoglobin A1c today at 9%.  She still has frequent/random hyperglycemia related to high carb meal and sweets/cookies.  Denies palpitation or heat intolerance.  Taking methimazole  5 mg daily.  Patient is accompanied by her husband in the clinic today.  No other complaints today.  REVIEW OF SYSTEMS As per history of present illness.   PAST MEDICAL HISTORY: Past Medical History:  Diagnosis Date   Acute bronchitis 08/24/2021   Acute cystitis 12/26/2022   Acute metabolic encephalopathy 11/04/2022   Acute renal failure (ARF) (HCC) 04/27/2023   AKI (acute kidney injury) (HCC) 04/30/2022   Aspiration pneumonia (HCC) 10/13/2022   CAD (coronary artery disease)    Cerebral infarction, unspecified (HCC) 09/27/2012   Chest pain of uncertain etiology 10/06/2022   Depression    Diabetes mellitus without complication (HCC)    Diabetic hyperosmolar non-ketotic state (HCC) 01/19/2023   DKA (diabetic ketoacidosis)  (HCC) 02/19/2021   History of CT scan    History of mammogram    History of MRI    Hypertension    Pheochromocytoma    Reversible cerebrovascular vasoconstriction syndrome    Spell of abnormal behavior    Stroke (HCC)    Thyroid  disease     PAST SURGICAL HISTORY: Past Surgical History:  Procedure Laterality Date   ABDOMINAL HYSTERECTOMY     BIOPSY  02/06/2023   Procedure: BIOPSY;  Surgeon: Leigh Elspeth SQUIBB, MD;  Location: [facility];  Service: Gastroenterology;;   CESAREAN SECTION     3   CORONARY ARTERY BYPASS GRAFT     ESOPHAGOGASTRODUODENOSCOPY (EGD) WITH PROPOFOL  N/A 02/06/2023   Procedure: ESOPHAGOGASTRODUODENOSCOPY (EGD) WITH PROPOFOL ;  Surgeon: Leigh Elspeth SQUIBB, MD;  Location: [facility];  Service: Gastroenterology;  Laterality: N/A;   Right adrenal gland removal for pheochromocytoma Right     ALLERGIES: Allergies  Allergen Reactions   Dorethia Benton ] Anaphylaxis and Swelling   Contrast Media [Iodinated Contrast Media] Anaphylaxis  and Swelling   Imitrex [Sumatriptan] Anaphylaxis   Ms Contin  [Morphine ] Anaphylaxis and Swelling   Penicillins Swelling and Other (See Comments)    Mouth and eyes swell   Chlorhexidine  Gluconate [Chlorhexidine ] Itching and Rash   Firvanq  [Vancomycin ] Other (See Comments)    Red Man's Syndrome   Cipro  [Ciprofloxacin  Hcl] Nausea And Vomiting   Nitrofuran Derivatives Anxiety   Wound Dressing Adhesive Itching and Rash    FAMILY HISTORY:  Family History  Problem Relation Age of Onset   Hypertension Mother    Diabetes Mother    Atrial fibrillation Mother    Hypertension Father    Heart attack Father    Dementia Father    Diabetes Brother    Brain cancer Brother    Asthma Daughter    Sickle cell anemia Son    Atrial fibrillation Maternal Uncle     SOCIAL HISTORY: Social History   Socioeconomic History   Marital status: Married    Spouse name: Not on file   Number of children: Not on file   Years of education: Not on  file   Highest education level: Not on file  Occupational History   Not on file  Tobacco Use   Smoking status: Never   Smokeless tobacco: Never  Vaping Use   Vaping status: Never Used  Substance and Sexual Activity   Alcohol use: Not Currently   Drug use: Never   Sexual activity: Not on file  Other Topics Concern   Not on file  Social History Narrative   Tobacco use, amount per day now: Never   Past tobacco use, amount per day: Never   How many years did you use tobacco: Never   Alcohol use (drinks per week): Not Currently.   Diet: Eat out a lot.    Do you drink/eat things with caffeine : Sweet Tea   Marital status:   Married                               What year were you married? 2000   Do you live in a house, apartment, assisted living, condo, trailer, etc.? House   Is it one or more stories? 1   How many persons live in your home? 2   Do you have pets in your home?( please list) No   Highest Level of education completed? Bachelors Degree.   Current or past profession: Solicitor, [employer]   Do you exercise?   No                               Type and how often?   Do you have a living will? No   Do you have a DNR form?       No                            If not, do you want to discuss one?   Do you have signed POA/HPOA forms?   No                     If so, please bring to you appointment      Do you have any difficulty bathing or dressing yourself? Yes   Do you have any difficulty preparing food or eating? Yes   Do you  have any difficulty managing your medications? Yes   Do you have any difficulty managing your finances? No   Do you have any difficulty affording your medications? Yes   Social Drivers of Corporate investment banker Strain: Not on file  Food Insecurity: No Food Insecurity (06/03/2023)   Hunger Vital Sign    Worried About Running Out of Food in the Last Year: Never true    Ran Out of Food in the Last Year:  Never true  Transportation Needs: No Transportation Needs (06/03/2023)   PRAPARE - Administrator, Civil Service (Medical): No    Lack of Transportation (Non-Medical): No  Physical Activity: Not on file  Stress: Not on file  Social Connections: Not on file    MEDICATIONS:  Current Outpatient Medications  Medication Sig Dispense Refill   acetaminophen  (TYLENOL ) 325 MG tablet Take 2 tablets (650 mg total) by mouth every 6 (six) hours as needed for mild pain (pain score 1-3) (or Fever >/= 101).     albuterol  (PROVENTIL ) (2.5 MG/3ML) 0.083% nebulizer solution Take 3 mLs (2.5 mg total) by nebulization every 6 (six) hours as needed for wheezing or shortness of breath. 75 mL 5   albuterol  (VENTOLIN  HFA) 108 (90 Base) MCG/ACT inhaler INHALE 2 PUFFS BY MOUTH EVERY 6 HOURS AS NEEDED FOR WHEEZING AND FOR SHORTNESS OF BREATH Strength: 108 (90 Base) MCG/ACT 6.7 g 5   atorvastatin  (LIPITOR ) 80 MG tablet Take 1 tablet (80 mg total) by mouth every evening. 80 tablet 2   baclofen  (LIORESAL ) 10 MG tablet Take 1 tablet (10 mg total) by mouth 3 (three) times daily. 90 tablet 1   clopidogrel  (PLAVIX ) 75 MG tablet TAKE 1 TABLET BY MOUTH DAILY 90 tablet 3   Continuous Glucose Sensor (FREESTYLE LIBRE 14 DAY SENSOR) MISC 1 each by Does not apply route as needed. DX: E11.69 2 each 12   Continuous Glucose Sensor (FREESTYLE LIBRE 3 PLUS SENSOR) MISC 1 each by Does not apply route continuous. Change every 15 days. 6 each 3   cyclobenzaprine  (FLEXERIL ) 5 MG tablet Take 1 tablet (5 mg total) by mouth 3 (three) times daily as needed for muscle spasms. 90 tablet 5   escitalopram  (LEXAPRO ) 20 MG tablet TAKE 1 TABLET BY MOUTH EVERY  MORNING 90 tablet 1   ezetimibe  (ZETIA ) 10 MG tablet TAKE 1 TABLET BY MOUTH DAILY 90 tablet 3   gabapentin  (NEURONTIN ) 100 MG capsule Take 1 capsule (100 mg total) by mouth 3 (three) times daily. 90 capsule 1   methimazole  (TAPAZOLE ) 5 MG tablet Take 1 tablet (5 mg total) by mouth every  morning. 90 tablet 3   metoCLOPramide  (REGLAN ) 10 MG tablet Take 1 tablet (10 mg total) by mouth every 6 (six) hours. 30 tablet 0   metoprolol  tartrate (LOPRESSOR ) 50 MG tablet Take 50 mg by mouth 2 (two) times daily.     olmesartan -hydrochlorothiazide  (BENICAR  HCT) 40-25 MG tablet Take 1 tablet by mouth daily.     pantoprazole  (PROTONIX ) 40 MG tablet Take 1 tablet (40 mg total) by mouth 2 (two) times daily. 60 tablet 5   ranolazine  (RANEXA ) 500 MG 12 hr tablet Take 1 tablet (500 mg total) by mouth 2 (two) times daily. 180 tablet 0   senna-docusate (SENOKOT-S) 8.6-50 MG tablet Take 1 tablet by mouth 2 (two) times daily.     tamsulosin  (FLOMAX ) 0.4 MG CAPS capsule Take 1 capsule (0.4 mg total) by mouth at bedtime. 30 capsule 5   topiramate  (  TOPAMAX ) 25 MG tablet TAKE 1 TABLET BY MOUTH TWICE DAILY 180 tablet 1   XARELTO  10 MG TABS tablet TAKE 1 TABLET BY MOUTH DAILY 100 tablet 2   HUMALOG  KWIKPEN 100 UNIT/ML KwikPen 10-15 units with meals 3 times a day, maximum 45 units/day. 30 mL 5   insulin  glargine (LANTUS  SOLOSTAR) 100 UNIT/ML Solostar Pen Inject 18 Units into the skin at bedtime. 15 mL 5   No current facility-administered medications for this visit.    PHYSICAL EXAM: Vitals:   07/30/23 1112  BP: 120/68  Pulse: (!) 123  Resp: 20  SpO2: 98%  Weight: 155 lb (70.3 kg)  Height: 5' 2 (1.575 m)    Body mass index is 28.35 kg/m.  Wt Readings from Last 3 Encounters:  07/30/23 155 lb (70.3 kg)  07/01/23 155 lb (70.3 kg)  06/27/23 155 lb 12.8 oz (70.7 kg)    General: Well developed, well nourished female in no apparent distress.  HEENT: AT/[place], no external lesions.  Eyes: Conjunctiva clear and no icterus. Neck: Neck supple  Lungs: Respirations not labored Neurologic: Alert, oriented, ?  Aphasia.  Left-sided hemiparesis. Extremities / Skin: Dry.  Psychiatric: Does not appear depressed or anxious  Diabetic Foot Exam - Simple   No data filed    LABS Reviewed Lab Results   Component Value Date   HGBA1C 9.0 (A) 07/30/2023   HGBA1C 10.0 (H) 04/25/2023   HGBA1C 8.5 (H) 11/24/2022   No results found for: FRUCTOSAMINE Lab Results  Component Value Date   CHOL 273 (H) 04/25/2023   HDL 55 04/25/2023   LDLCALC 187 (H) 04/25/2023   TRIG 165 (H) 04/25/2023   CHOLHDL 5.0 (H) 04/25/2023   No results found for: MICRALBCREAT Lab Results  Component Value Date   CREATININE 0.78 06/15/2023   No results found for: GFR  ASSESSMENT / PLAN  1. Type 2 diabetes mellitus with other specified complication, with long-term current use of insulin  (HCC)   2. Type 2 diabetes mellitus with hyperlipidemia (HCC)   3. Hyperthyroidism   4. History of pheochromocytoma      Diabetes Mellitus type 2, complicated by CVA, CAD - Diabetic status / severity: Uncontrolled.  Improving.  Lab Results  Component Value Date   HGBA1C 9.0 (A) 07/30/2023    - Hemoglobin A1c goal : <7%  Hemoglobin A1c 9% today.  Patient has uncontrolled type 2 diabetes mellitus.  Discussed about importance of controlling blood sugar to prevent diabetic related complications including diabetic retinopathy, neuropathy and nephropathy.  She has history of stroke with left-sided hemiparesis.  She has gastroparesis, she used to be on Ozempic  and then was stopped due to worsening nausea and vomiting.  - Medications: See below.  I) increase Lantus  from 15 to 18 units daily. II) adjust Humalog  10 to 115 units based on meal size, smaller meal 10units and larger meal up to 15 units up to 3 times a day.  Advised to take Humalog  10 to 15 minutes before eating preferably. IV) consider non-insulin  regimen in the future.  Patient has history of diabetes ketoacidosis.  Currently on multidose insulin  regimen.  Patient is advised not to miss any of insulin .  Will check type I autoimmune diabetes panel today.   - Home glucose testing: CGM Freestyle libre.  Actual bring phone /receiver to download and review in  the follow-up visit. - Discussed/ Gave Hypoglycemia treatment plan.  # Consult : not required at this time.   # Annual urine for microalbuminuria/ creatinine ratio,  no microalbuminuria currently, continue ACE/ARB /olmesartan .  Will try to check in the future visit.  She uses diaper and has suprapubic catheter difficult to collect a sample.  Last No results found for: MICRALBCREAT  # Foot check nightly / ? neuropathy, continue gabapentin , managed by primary care provider.  # Annual dilated diabetic eye exams.  She needs diabetic eye exam.  - Diet: Make healthy diabetic food choices  2. Blood pressure  -  BP Readings from Last 1 Encounters:  07/30/23 120/68    - Control is in target.  - No change in current plans.  3. Lipid status / Hyperlipidemia - Last  Lab Results  Component Value Date   LDLCALC 187 (H) 04/25/2023   - Continue atorvastatin , ezetimibe , managed by primary care provider.  # Hyperthyroidism - Currently on methimazole  5 mg daily.  She was diagnosed with hyperthyroidism in December 2022 when hospitalized.  Recent thyroid  function test with normal TSH. - Etiology of hyperthyroidism is unclear at this time.  Will consider checking thyroid  autoantibodies in the future.  Thyroid  was unremarkable in the CT scan in the past. - Continue current dose of methimazole  5 mg daily.  # History of pheochromocytoma, Status post right adrenalectomy in ? 2018. - She had normal left adrenal gland and absence of right adrenal gland on CT scan in May 2025. - Will monitor with annual plasma free metanephrines, as needed 24-hour urine metanephrines.  - Check plasma free metanephrine today.   Diagnoses and all orders for this visit:  Type 2 diabetes mellitus with other specified complication, with long-term current use of insulin  (HCC) -     POCT glycosylated hemoglobin (Hb A1C) -     HUMALOG  KWIKPEN 100 UNIT/ML KwikPen; 10-15 units with meals 3 times a day, maximum 45  units/day. -     Glutamic acid decarboxylase auto abs -     Basic metabolic panel with GFR -     IA-2 Antibody -     ZNT8 Antibodies -     Continuous Glucose Sensor (FREESTYLE LIBRE 3 PLUS SENSOR) MISC; 1 each by Does not apply route continuous. Change every 15 days.  Type 2 diabetes mellitus with hyperlipidemia (HCC) -     insulin  glargine (LANTUS  SOLOSTAR) 100 UNIT/ML Solostar Pen; Inject 18 Units into the skin at bedtime.  Hyperthyroidism  History of pheochromocytoma -     Metanephrines, plasma     DISPOSITION Follow up in clinic in 3 months suggested.  Labs today.   All questions answered and patient verbalized understanding of the plan.  Lori Sherlyne Crownover, MD [facility] [address] [place], [address] [place], [place] [address] Phone # [phone]  At least part of this note was generated using voice recognition software. Inadvertent word errors may have occurred, which were not recognized during the proofreading process.

## 2023-07-30 NOTE — Telephone Encounter (Signed)
 I called patient and she is agreeable to be seen sooner than scheduled appointment with PCP Ngetich, [name], NP 08/13/2023. Patient scheduled to be seen acutely for medication Ambien  tomorrow 07/31/2023 with Monina Vargas, NP.

## 2023-07-31 ENCOUNTER — Encounter: Payer: Self-pay | Admitting: Adult Health

## 2023-07-31 ENCOUNTER — Ambulatory Visit (INDEPENDENT_AMBULATORY_CARE_PROVIDER_SITE_OTHER): Admitting: Adult Health

## 2023-07-31 VITALS — BP 127/75 | HR 118 | Temp 97.5°F | Resp 18 | Ht 62.0 in | Wt 168.8 lb

## 2023-07-31 DIAGNOSIS — G8114 Spastic hemiplegia affecting left nondominant side: Secondary | ICD-10-CM

## 2023-07-31 DIAGNOSIS — E0590 Thyrotoxicosis, unspecified without thyrotoxic crisis or storm: Secondary | ICD-10-CM | POA: Diagnosis not present

## 2023-07-31 DIAGNOSIS — E1169 Type 2 diabetes mellitus with other specified complication: Secondary | ICD-10-CM

## 2023-07-31 DIAGNOSIS — I639 Cerebral infarction, unspecified: Secondary | ICD-10-CM

## 2023-07-31 DIAGNOSIS — E785 Hyperlipidemia, unspecified: Secondary | ICD-10-CM

## 2023-07-31 DIAGNOSIS — Z86718 Personal history of other venous thrombosis and embolism: Secondary | ICD-10-CM

## 2023-07-31 DIAGNOSIS — I10 Essential (primary) hypertension: Secondary | ICD-10-CM

## 2023-07-31 DIAGNOSIS — F5101 Primary insomnia: Secondary | ICD-10-CM | POA: Diagnosis not present

## 2023-07-31 MED ORDER — ZOLPIDEM TARTRATE ER 12.5 MG PO TBCR: 12.5000 mg | EXTENDED_RELEASE_TABLET | Freq: Every evening | ORAL | 0 refills | Status: DC | PRN

## 2023-07-31 NOTE — Progress Notes (Signed)
 [facility] clinic  Provider:  Jereld Serum DNP  Code Status:  Full Code  Goals of Care:     06/27/2023    2:24 PM  Advanced Directives  Does Patient Have a Medical Advance Directive? No  Would patient like information on creating a medical advance directive? No - Patient declined     Chief Complaint  Patient presents with   Medication Management     Patient is requesting to be placed back on medication Ambien    Discussed the use of AI scribe software for clinical note transcription with the patient, who gave verbal consent to proceed.   HPI: Patient is a 53 y.o. female seen today for an acute visit for difficulty sleeping. She was accompanied today by her husband.  She has been experiencing difficulty sleeping since April, with a noted increase in severity over the past month. She attributes this to being taken off Ambien , which she previously used with good effect. Currently, she sometimes does not sleep at all and may be awake for days, averaging about four hours of sleep per night inconsistently. She has tried melatonin gummies and Z-Quil without success.  She has a history of stroke, resulting in left-sided weakness, slurred speech, and difficulty lifting her left hand. She receives Botox  injections for spasticity and has previously undergone physical, occupational, and speech therapy, which was discontinued following hospitalization three months ago. She wants to resume therapy.  Her medication regimen includes Tylenol  as needed, atorvastatin , baclofen , Plavix , Lexapro , gabapentin , Humalog , Lantus , tapazole , olmesartan , metoprolol , Protonix , ranolazine , Senokot, Flomax , and Xarelto . She no longer uses a nebulizer due to adverse effects. She monitors her blood sugar with a Freestyle Libre, reporting a recent level of 202 mg/dL and an average low of 867 mg/dL. Her last A1c was 9%.  She has a history of DVT in the left leg and takes Xarelto  for anticoagulation. No current  redness or swelling in the leg. She also has hyperthyroidism managed with tapazole , with a normal TSH level recently. Bowel movements occur every three days, considered normal for her, and she takes Senokot for this.  Her social history includes having four children aged 43, 23, 58, and 62, and three grandchildren aged 57, 39, and 4.    Past Medical History:  Diagnosis Date   Acute bronchitis 08/24/2021   Acute cystitis 12/26/2022   Acute metabolic encephalopathy 11/04/2022   Acute renal failure (ARF) (HCC) 04/27/2023   AKI (acute kidney injury) (HCC) 04/30/2022   Aspiration pneumonia (HCC) 10/13/2022   CAD (coronary artery disease)    Cerebral infarction, unspecified (HCC) 09/27/2012   Chest pain of uncertain etiology 10/06/2022   Depression    Diabetes mellitus without complication (HCC)    Diabetic hyperosmolar non-ketotic state (HCC) 01/19/2023   DKA (diabetic ketoacidosis) (HCC) 02/19/2021   History of CT scan    History of mammogram    History of MRI    Hypertension    Pheochromocytoma    Reversible cerebrovascular vasoconstriction syndrome    Spell of abnormal behavior    Stroke [facility])    Thyroid  disease     Past Surgical History:  Procedure Laterality Date   ABDOMINAL HYSTERECTOMY     BIOPSY  02/06/2023   Procedure: BIOPSY;  Surgeon: Leigh Elspeth SQUIBB, MD;  Location: [facility] ENDOSCOPY;  Service: Gastroenterology;;   CESAREAN SECTION     3   CORONARY ARTERY BYPASS GRAFT     ESOPHAGOGASTRODUODENOSCOPY (EGD) WITH PROPOFOL  N/A 02/06/2023   Procedure: ESOPHAGOGASTRODUODENOSCOPY (EGD) WITH PROPOFOL ;  Surgeon: Leigh Elspeth SQUIBB, MD;  Location: [facility] ENDOSCOPY;  Service: Gastroenterology;  Laterality: N/A;   Right adrenal gland removal for pheochromocytoma Right     Allergies  Allergen Reactions   Dorethia Gunner ] Anaphylaxis and Swelling   Contrast Media [Iodinated Contrast Media] Anaphylaxis and Swelling   Imitrex [Sumatriptan] Anaphylaxis   Ms Contin  [Morphine ] Anaphylaxis and  Swelling   Penicillins Swelling and Other (See Comments)    Mouth and eyes swell   Chlorhexidine  Gluconate [Chlorhexidine ] Itching and Rash   Firvanq  [Vancomycin ] Other (See Comments)    Red Man's Syndrome   Cipro  [Ciprofloxacin  Hcl] Nausea And Vomiting   Nitrofuran Derivatives Anxiety   Wound Dressing Adhesive Itching and Rash    Outpatient Encounter Medications as of 07/31/2023  Medication Sig   acetaminophen  (TYLENOL ) 325 MG tablet Take 2 tablets (650 mg total) by mouth every 6 (six) hours as needed for mild pain (pain score 1-3) (or Fever >/= 101).   albuterol  (VENTOLIN  HFA) 108 (90 Base) MCG/ACT inhaler INHALE 2 PUFFS BY MOUTH EVERY 6 HOURS AS NEEDED FOR WHEEZING AND FOR SHORTNESS OF BREATH Strength: 108 (90 Base) MCG/ACT   atorvastatin  (LIPITOR ) 80 MG tablet Take 1 tablet (80 mg total) by mouth every evening.   baclofen  (LIORESAL ) 10 MG tablet Take 1 tablet (10 mg total) by mouth 3 (three) times daily.   clopidogrel  (PLAVIX ) 75 MG tablet TAKE 1 TABLET BY MOUTH DAILY   Continuous Glucose Sensor (FREESTYLE LIBRE 14 DAY SENSOR) MISC 1 each by Does not apply route as needed. DX: E11.69   Continuous Glucose Sensor (FREESTYLE LIBRE 3 PLUS SENSOR) MISC 1 each by Does not apply route continuous. Change every 15 days.   cyclobenzaprine  (FLEXERIL ) 5 MG tablet Take 1 tablet (5 mg total) by mouth 3 (three) times daily as needed for muscle spasms.   escitalopram  (LEXAPRO ) 20 MG tablet TAKE 1 TABLET BY MOUTH EVERY  MORNING   ezetimibe  (ZETIA ) 10 MG tablet TAKE 1 TABLET BY MOUTH DAILY   gabapentin  (NEURONTIN ) 100 MG capsule Take 1 capsule (100 mg total) by mouth 3 (three) times daily.   HUMALOG  KWIKPEN 100 UNIT/ML KwikPen 10-15 units with meals 3 times a day, maximum 45 units/day.   insulin  glargine (LANTUS  SOLOSTAR) 100 UNIT/ML Solostar Pen Inject 18 Units into the skin at bedtime.   methimazole  (TAPAZOLE ) 5 MG tablet Take 1 tablet (5 mg total) by mouth every morning.   metoCLOPramide  (REGLAN ) 10  MG tablet Take 1 tablet (10 mg total) by mouth every 6 (six) hours.   metoprolol  tartrate (LOPRESSOR ) 50 MG tablet Take 50 mg by mouth 2 (two) times daily.   olmesartan -hydrochlorothiazide  (BENICAR  HCT) 40-25 MG tablet Take 1 tablet by mouth daily.   pantoprazole  (PROTONIX ) 40 MG tablet Take 1 tablet (40 mg total) by mouth 2 (two) times daily.   ranolazine  (RANEXA ) 500 MG 12 hr tablet Take 1 tablet (500 mg total) by mouth 2 (two) times daily.   senna-docusate (SENOKOT-S) 8.6-50 MG tablet Take 1 tablet by mouth 2 (two) times daily.   tamsulosin  (FLOMAX ) 0.4 MG CAPS capsule Take 1 capsule (0.4 mg total) by mouth at bedtime.   topiramate  (TOPAMAX ) 25 MG tablet TAKE 1 TABLET BY MOUTH TWICE DAILY   XARELTO  10 MG TABS tablet TAKE 1 TABLET BY MOUTH DAILY   zolpidem  (AMBIEN  CR) 12.5 MG CR tablet Take 1 tablet (12.5 mg total) by mouth at bedtime as needed for sleep.   [DISCONTINUED] albuterol  (PROVENTIL ) (2.5 MG/3ML) 0.083% nebulizer solution Take 3  mLs (2.5 mg total) by nebulization every 6 (six) hours as needed for wheezing or shortness of breath. (Patient not taking: Reported on 07/31/2023)   No facility-administered encounter medications on file as of 07/31/2023.    Review of Systems:  Review of Systems  Constitutional:  Negative for appetite change, chills, fatigue and fever.  HENT:  Negative for congestion, hearing loss, rhinorrhea and sore throat.   Eyes: Negative.   Respiratory:  Negative for cough, shortness of breath and wheezing.   Cardiovascular:  Negative for chest pain, palpitations and leg swelling.  Gastrointestinal:  Negative for abdominal pain, constipation, diarrhea, nausea and vomiting.  Genitourinary:  Negative for dysuria.  Musculoskeletal:  Negative for arthralgias, back pain and myalgias.  Skin:  Negative for color change, rash and wound.  Neurological:  Negative for dizziness, weakness and headaches.  Psychiatric/Behavioral:  Positive for sleep disturbance. Negative for  behavioral problems. The patient is not nervous/anxious.     Health Maintenance  Topic Date Due   Medicare Annual Wellness (AWV)  Never done   FOOT EXAM  Never done   Diabetic kidney evaluation - Urine ACR  Never done   Hepatitis B Vaccines (1 of 3 - 19+ 3-dose series) Never done   Colonoscopy  Never done   MAMMOGRAM  Never done   Zoster Vaccines- Shingrix (1 of 2) Never done   COVID-19 Vaccine (3 - 2024-25 season) 12/26/2023 (Originally 10/06/2022)   Pneumococcal Vaccine 7-71 Years old (1 of 2 - PCV) 04/24/2024 (Originally 10/13/1991)   INFLUENZA VACCINE  09/05/2023   HEMOGLOBIN A1C  01/29/2024   OPHTHALMOLOGY EXAM  02/19/2024   Diabetic kidney evaluation - eGFR measurement  06/14/2024   Hepatitis C Screening  Completed   HIV Screening  Completed   HPV VACCINES  Aged Out   Meningococcal B Vaccine  Aged Out   DTaP/Tdap/Td  Discontinued    Physical Exam: Vitals:   07/31/23 1432  BP: 127/75  Pulse: (!) 118  Resp: 18  Temp: (!) 97.5 F (36.4 C)  SpO2: 98%  Weight: 168 lb 12.8 oz (76.6 kg)  Height: 5' 2 (1.575 m)   Body mass index is 30.87 kg/m. Physical Exam Constitutional:      General: She is not in acute distress.    Appearance: She is obese.  HENT:     Head: Normocephalic and atraumatic.     Nose: Nose normal.     Mouth/Throat:     Mouth: Mucous membranes are moist.   Eyes:     Conjunctiva/sclera: Conjunctivae normal.    Cardiovascular:     Rate and Rhythm: Normal rate and regular rhythm.  Pulmonary:     Effort: Pulmonary effort is normal.     Breath sounds: Normal breath sounds.  Abdominal:     General: Bowel sounds are normal.     Palpations: Abdomen is soft.   Musculoskeletal:     Cervical back: Normal range of motion.   Skin:    General: Skin is warm and dry.   Neurological:     Mental Status: She is alert and oriented to person, place, and time.     Motor: Weakness present.     Comments: Left-sided weakness  Psychiatric:        Mood and  Affect: Mood normal.        Behavior: Behavior normal.        Thought Content: Thought content normal.        Judgment: Judgment normal.     Labs  reviewed: Basic Metabolic Panel: Recent Labs    12/02/22 0530 12/05/22 0500 01/02/23 0440 01/03/23 1104 04/29/23 0519 04/30/23 0520 05/11/23 0851 05/12/23 0529 05/13/23 0453 05/15/23 1126 06/03/23 0530 06/04/23 0342 06/09/23 0304 06/10/23 0410 06/15/23 0744  NA 136   < > 130*   < > 144   < > 135   < > 131*   < >  --    < > 141 136 140  K 4.0   < > 3.3*   < > 3.8   < > 2.9*   < > 3.2*   < >  --    < > 3.9 3.7 3.4*  CL 105   < > 97*   < > 110   < > 102   < > 102   < >  --    < > 108 108 107  CO2 23   < > 20*   < > 25   < > 22   < > 23   < >  --    < > 21* 21* 21*  GLUCOSE 196*   < > 235*   < > 103*   < > 204*   < > 180*   < >  --    < > 113* 166* 182*  BUN 20   < > 8   < > 25*   < > 10   < > 10   < >  --    < > <5* 6 6  CREATININE 0.94   < > 0.77   < > 1.46*   < > 0.72   < > 0.80   < >  --    < > 0.84 0.80 0.78  CALCIUM  8.9   < > 8.5*   < > 9.1   < > 8.4*   < > 7.9*   < >  --    < > 8.8* 8.2* 9.4  MG 1.7  --  1.7   < > 1.7  --  1.4*  --  1.7  --   --   --   --   --   --   PHOS 4.1  --  3.3  --  3.5  --   --   --   --   --   --   --   --   --   --   TSH  --   --   --    < > 0.475  --  0.437  --   --   --  0.416  --   --   --   --    < > = values in this interval not displayed.   Liver Function Tests: Recent Labs    06/09/23 0304 06/10/23 0410 06/15/23 0744  AST 11* 11* 14*  ALT 11 11 11   ALKPHOS 57 56 60  BILITOT 0.5 0.4 0.6  PROT 5.5* 5.4* 6.9  ALBUMIN  2.4* 2.3* 3.3*   Recent Labs    05/29/23 1257 06/02/23 2328 06/15/23 0744  LIPASE 22 19 38   Recent Labs    11/22/22 0920 01/31/23 0107  AMMONIA 45* 13   CBC: Recent Labs    06/02/23 2328 06/04/23 0342 06/10/23 0410 06/15/23 0744 06/27/23 1459  WBC 11.3*   < > 7.3 14.3* 7.7  NEUTROABS 10.5*  --   --  11.0* 4,397  HGB 11.1*   < > 9.6* 12.2 13.1  HCT  33.3*   < >  28.5* 36.6 43.2  MCV 81.0   < > 80.3 78.7* 87.1  PLT 348   < > 447* 640* 443*   < > = values in this interval not displayed.   Lipid Panel: Recent Labs    10/07/22 0311 04/25/23 1430  CHOL 132 273*  HDL 35* 55  LDLCALC 66 187*  TRIG 157* 165*  CHOLHDL 3.8 5.0*   Lab Results  Component Value Date   HGBA1C 9.0 (A) 07/30/2023    Procedures since last visit: No results found.  Assessment/Plan  1. Primary insomnia (Primary) -  She prefers to resume Ambien  despite risks due to past effectiveness. Melatonin and Z-Quil ineffective. - Discuss potential side effects of Ambien , including dependency, sleepwalking, psychiatric effects - zolpidem  (AMBIEN  CR) 12.5 MG CR tablet; Take 1 tablet (12.5 mg total) by mouth at bedtime as needed for sleep.  Dispense: 30 tablet; Refill: 0  2. 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 Requires therapy for improved function and safety. - Ambulatory referral to Home Health  3. Type 2 diabetes mellitus with hyperlipidemia (HCC) -  Blood sugar control suboptimal with A1c at 9.0%. - Continue Lantus  18 units at bedtime. - Continue Humalog  sliding scale, 10-15 units three times a day - Instruct to keep a blood sugar log and bring it to follow-up  4. Hyperthyroidism Lab Results  Component Value Date   TSH 0.416 06/03/2023    - TSH level normal on current tapazole  dose. - Continue tapazole  5 mg daily.  5. History of DVT (deep vein thrombosis) -   no current symptoms. - Continue Xarelto  10 mg daily.  6. Essential hypertension -  Blood pressure well-controlled on current regimen. - Continue olmesartan -HCTZ 40/25 mg daily. - Continue metoprolol  50 mg twice a day.       Labs/tests ordered:   None   Return if symptoms worsen or fail to improve.  Katerra Ingman Medina-Vargas, NP

## 2023-08-09 LAB — METANEPHRINES, PLASMA
Metanephrine, Free: <25 pg/mL (ref <=57)
Normetanephrine, Free: 88 pg/mL (ref <=148)
Total Metanephrines-Plasma: 88 pg/mL (ref <=205)

## 2023-08-09 LAB — BASIC METABOLIC PANEL WITH GFR
BUN: 15 mg/dL (ref 7–25)
CO2: 19 mmol/L — ABNORMAL LOW (ref 20–32)
Calcium: 9.4 mg/dL (ref 8.6–10.4)
Chloride: 105 mmol/L (ref 98–110)
Creat: 0.72 mg/dL (ref 0.50–1.03)
Glucose, Bld: 254 mg/dL — ABNORMAL HIGH (ref 65–139)
Potassium: 4.3 mmol/L (ref 3.5–5.3)
Sodium: 136 mmol/L (ref 135–146)
eGFR: 102 mL/min/1.73m2 (ref >=60)

## 2023-08-09 LAB — ZNT8 ANTIBODIES: ZNT8 Antibodies: <10 U/mL (ref <15)

## 2023-08-09 LAB — IA-2 ANTIBODY: IA-2 Antibody: <5.4 U/mL (ref <5.4)

## 2023-08-09 LAB — GLUTAMIC ACID DECARBOXYLASE AUTO ABS: Glutamic Acid Decarb Ab: <5 [IU]/mL (ref <5)

## 2023-08-12 ENCOUNTER — Ambulatory Visit: Admitting: Gastroenterology

## 2023-08-12 NOTE — Progress Notes (Deleted)
 HPI :  53 y/o female here for follow up after hospitalization.  Patient with numerous comorbidities, diabetes, gastroparesis, GERD history of CAD, history of stroke, on Plavix  and Xarelto .  She is bedbound, lives at home with husband.  Recall she was admitted to the hospital in December with transient encephalopathy, was found to have intracranial stenosis, neurology discontinued her amlodipine .   Return to the ED in early January with coffee-ground emesis, history of iron  deficiency anemia.  She has had problems with GERD and symptoms of gastroparesis.   She was admitted to the hospital EGD performed on January 2.  She had LA grade C esophagitis.  Pyloric outlet was patent.  She had some gastric and duodenal polyps, not removed but duodenal polyp was sampled and benign.  Gastric polyps were not removed, she had recent anticoagulation.  Biopsies negative for H. pylori.   We increased her pantoprazole  to 40 mg twice daily.  Placed her on Reglan  for gastroparesis, which we she was not on previously.   She is here for follow-up today.  She states overall her upper tract symptoms are improved but they do persist to some extent.  Her Protonix  was reduced to 40 mg once daily.  Her Reglan  is being taken at 5 mg 3 times daily.  She states these measures have certainly helped and her symptoms are not as bad, but she continues to have some nocturnal pyrosis that bothers her.  She does have early satiety but she is not vomiting.  No further hematemesis.   She does have constipation that has been bothering her.  Taking MiraLAX  daily and not really helping much.  She is moving her bowels every few days.  She remains on Plavix  and Xarelto .   We had discussed in the hospital potentially repeating an endoscopy in a few months to confirm mucosal healing and rule out Barrett's, potentially remove gastric polyps and consideration for colonoscopy given her IDA.  She has never had a colonoscopy before.   Her  hemoglobin has been trending up since being placed on higher dose PPI, she is not on any iron  supplementation.  Her hemoglobin has normalized.  Unfortunately she was seen in the ED for roughly 2 days just a few days ago, for elevated glucose levels and AKI.   We discussed if she could tolerate endoscopic procedures or not     Previous GI Evaluations    Nov 2024 Gastric emptying FINDINGS: Expected location of the stomach in the left upper quadrant. Ingested meal empties the stomach gradually over the course of the study with 67% retention at 60 min and 52% retention at 120 min and 24% retention at 240 minutes (normal retention less than 30% at a 120 min).   IMPRESSION: Delayed gastric emptying.   EGD 02/06/23: Esophagogastric landmarks were identified: the Z-line was found at 34 cm, the gastroesophageal junction was found at 34 cm and the upper extent of the gastric folds was found at 35 cm from the incisors. Findings: A 1 cm hiatal hernia was present. LA Grade C esophagitis with no bleeding was found 28 to 34 cm from the incisors. The exam of the esophagus was otherwise normal. A single 5 mm sessile polyp was found in the gastric body. Not removed given PLavix  taken 2 days ago. There was some benign mottled appearance of the proximal stomach. The exam of the stomach was otherwise normal. Biopsies were taken with a cold forceps for Helicobacter pylori testing. A diverticulum was found in the  second portion of the duodenum. A single 4 mm sessile polyp was found in the second portion of the duodenum, not far away from normal appearing ampulla. Biopsies were taken with a cold forceps for histology to rule out adenomatous change. Positioning was quite challenging, only able to take a small biopsy / superficial. If removal is needed at some point, would be best to do with side viewing endoscope. The exam of the duodenum was otherwise normal.   No high risk pathology to cause bleeding, which is likely  due to esophagitis in the setting of nausea / vomiting. There is NO evidence of pathology or gastric outlet obstruction to cause her nausea / vomiting. I do think dx of gastroparesis is correct in the setting of DM. She should be on protonix  40mg  BID for 6 weeks and then once daily thereafter, if gastroparesis symptoms persist. Continue Reglan  if that has helped. Consider transition to Gimoti  as outpatient if covered by insurance and still having symptoms despite Reglan .   - Return patient to hospital ward for ongoing care. - Advance diet as tolerated. - Continue present medications. - Protonix  40mg  twice daily for 6 weeks and then once daily thereafter - Continue Reglan  - Await pathology results. - Repeat EGD in a few months as outpatient, assess for mucosal healing, rule out Barrett's, and removal of gastric / duodenal polyps if needed - Resume Plavix  / Xarelto  tomorrow   FINAL MICROSCOPIC DIAGNOSIS:   A. 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 STOMACH, ANTRUM AND BODY, BIOPSY:  - Gastric antral and oxyntic mucosa with no specific histopathologic  changes  - Helicobacter pylori-like organisms are not identified on routine HE  stain      53 y.o. female here for assessment of the following   1. Gastroesophageal reflux disease with esophagitis and hemorrhage   2. Iron  deficiency anemia, unspecified iron  deficiency anemia type   3. Diabetic gastroparesis (HCC)   4. Anticoagulated   5. Chronic constipation     She has gastroparesis causing significant reflux.  This culminated with erosive esophagitis causing hematemesis in the setting of anticoagulation back in January.  I think this is likely the cause of her IDA although she has never had a prior colonoscopy.   We escalated her PPI therapy and started her on low-dose Reglan .  Symptomatically she is definitely improved in regards to her reflux and early satiety  although not resolved.  We discussed options.  She continues to have significant nocturnal pyrosis.  I would recommend we increase her Protonix  back to 40 mg twice daily, and add liquid Carafate  nightly to see if this will help reduce her nighttime symptoms.  She is agreeable with this.   She does appear to be improving on Reglan  for her gastroparesis.  We discussed risks of Reglan  use, want to use lowest dose needed to control symptoms.  If she could get Gimoti  covered, intranasal Reglan , this is often better tolerated with less risk and better efficacy.  Will see if her insurance will cover this and switch her from Reglan  to Gimoti  if possible.   We otherwise discussed her iron  deficiency and erosive esophagitis and if she could tolerate repeat EGD to assess for mucosal healing, potentially remove stomach/duodenal polyps, and colonoscopy for screening and to evaluate IDA.  Her clinical course has been tenuous recently.  She just had a recent ED visit for AKI and hyperglycemia.  She does not think  she is in a place where she could tolerate colonoscopy prep or colonoscopy right now.  I think this would be extremely difficult for her to do given her immobility and bedbound status.  We will repeat her labs in 6 weeks on the regimen to see if her iron  deficiency/anemia remains controlled/resolved.   For now she wants to hold off on repeat EGD and colonoscopy.  I would like to see her back in 4 months or so for reassessment to discuss her course and if this is something she could proceed with.  We discussed if we pursue a colonoscopy she would need approval to hold her Plavix  and Xarelto .   Will treat symptomatically for now.  For her constipation, MiraLAX  not working well for her.  We had free samples of Motegrity  in the office, we will give her 2 mg daily for 2 weeks and see if that will help her symptoms, if so we will see if insurance would approve prescription.  This may also help her gastroparesis      PLAN: - increase protonix  to BID dosing - see if we can switch Reglan  to Gimoti  15mg  TID (if covered by insurance) - add liquid carafate  10cc qHS - repeat CBC and TIBC / ferritin in 6 weeks - she does not think she can tolerate EGD and colonoscopy right now (just had recurrent ED visit with hyperglycemia and AKI) - follow up in a the office in a few months to discuss this again - trial of motegrity  if free samples - 2mg  / da for 2 weeks. Will give script if she finds it useful      Admited in April: Ms. Marian is a 53 year old womanwith history of diabetes type 2 with gastroparesis, coronary disease status post CABG, chronic diastolic CHF, OSA on CPAP, CVA with residual left-sided weakness, wheelchair-bound, history of DVT on Xarelto , history of PE, hypothyroidism, frequent hospitalization for gastroparesis admitted with symptoms of intractable nausea, vomiting for which GI is consulted.   She has had an extensive previous evaluation for her symptoms including CTAP x 2, gastric emptying scan, EGD, brain MRI and laboratory tests.  The only pertinent findings on these have included radiographic evidence of chronic pancreatitis as well as delayed gastric emptying.  No other anatomic abnormalities, intracranial abnormalities, thyroid  dysfunction or adrenal insufficiency.  At this juncture, I surmise that her current symptoms are an exacerbation of her underlying gastroparesis and do not feel that additional diagnostics are necessarily warranted at this time.   With regard to her current management, I would recommend optimizing antacid therapy as well as promotility agents in the form of Reglan .  She is also endorsing symptoms of recent constipation without a bowel movement for 4 days which may contribute to worsening foregut dysmotility.  Instituting a bowel regimen would be indicated.  If she is unable to tolerate an oral bowel regimen stimulating bowel motility from below with suppositories  and/or enemas may be beneficial.    Plan  Increase Reglan  from 5 mg IV every 6 hours to 10 mg IV every 6 hours Continue pantoprazole  40 mg orally daily If symptoms are not improving can consider the addition of a scopolamine patch 1.5 mg transdermally every 72 hours Consider trial of Carafate  slurry 1 g p.o. 4 times daily for symptoms of throat discomfort/odynophagia Recommend instituting bowel regimen with MiraLAX , senna or Dulcolax.  If her nausea and vomiting preclude administering laxative agents from above can consider starting with suppository or enema. For history of anemia  consider updating labs -iron , ferritin, TIBC   CT abdomen / pelvis 06/15/23: IMPRESSION: 1. No acute abnormality of the abdomen or pelvis. 2. Intermediate sized stool ball in the rectum. 3. Small sliding hiatal hernia.       Past Medical History:  Diagnosis Date   Acute bronchitis 08/24/2021   Acute cystitis 12/26/2022   Acute metabolic encephalopathy 11/04/2022   Acute renal failure (ARF) (HCC) 04/27/2023   AKI (acute kidney injury) (HCC) 04/30/2022   Aspiration pneumonia (HCC) 10/13/2022   CAD (coronary artery disease)    Cerebral infarction, unspecified (HCC) 09/27/2012   Chest pain of uncertain etiology 10/06/2022   Depression    Diabetes mellitus without complication (HCC)    Diabetic hyperosmolar non-ketotic state (HCC) 01/19/2023   DKA (diabetic ketoacidosis) (HCC) 02/19/2021   History of CT scan    History of mammogram    History of MRI    Hypertension    Pheochromocytoma    Reversible cerebrovascular vasoconstriction syndrome    Spell of abnormal behavior    Stroke [facility])    Thyroid  disease      Past Surgical History:  Procedure Laterality Date   ABDOMINAL HYSTERECTOMY     BIOPSY  02/06/2023   Procedure: BIOPSY;  Surgeon: Leigh Elspeth SQUIBB, MD;  Location: [facility] END[facility];  Service: Gastroenterology;;   CESAREAN SECTION     3   CORONARY ARTERY BYPASS GRAFT      ESOPHAGOGASTRODUODENOSCOPY (EGD) WITH PROPOFOL  N/A 02/06/2023   Procedure: ESOPHAGOGASTRODUODENOSCOPY (EGD) WITH PROPOFOL ;  Surgeon: Leigh Elspeth SQUIBB, MD;  Location: [facility] ENDOSCOPY;  Service: Gastroenterology;  Laterality: N/A;   Right adrenal gland removal for pheochromocytoma Right    Family History  Problem Relation Age of Onset   Hypertension Mother    Diabetes Mother    Atrial fibrillation Mother    Hypertension Father    Heart attack Father    Dementia Father    Diabetes Brother    Brain cancer Brother    Asthma Daughter    Sickle cell anemia Son    Atrial fibrillation Maternal Uncle    Social History   Tobacco Use   Smoking status: Never   Smokeless tobacco: Never  Vaping Use   Vaping status: Never Used  Substance Use Topics   Alcohol use: Not Currently   Drug use: Never   Current Outpatient Medications  Medication Sig Dispense Refill   acetaminophen  (TYLENOL ) 325 MG tablet Take 2 tablets (650 mg total) by mouth every 6 (six) hours as needed for mild pain (pain score 1-3) (or Fever >/= 101).     albuterol  (VENTOLIN  HFA) 108 (90 Base) MCG/ACT inhaler INHALE 2 PUFFS BY MOUTH EVERY 6 HOURS AS NEEDED FOR WHEEZING AND FOR SHORTNESS OF BREATH Strength: 108 (90 Base) MCG/ACT 6.7 g 5   atorvastatin  (LIPITOR ) 80 MG tablet Take 1 tablet (80 mg total) by mouth every evening. 80 tablet 2   baclofen  (LIORESAL ) 10 MG tablet Take 1 tablet (10 mg total) by mouth 3 (three) times daily. 90 tablet 1   clopidogrel  (PLAVIX ) 75 MG tablet TAKE 1 TABLET BY MOUTH DAILY 90 tablet 3   Continuous Glucose Sensor (FREESTYLE LIBRE 14 DAY SENSOR) MISC 1 each by Does not apply route as needed. DX: E11.69 2 each 12   Continuous Glucose Sensor (FREESTYLE LIBRE 3 PLUS SENSOR) MISC 1 each by Does not apply route continuous. Change every 15 days. 6 each 3   cyclobenzaprine  (FLEXERIL ) 5 MG tablet Take 1 tablet (5 mg total) by mouth  3 (three) times daily as needed for muscle spasms. 90 tablet 5   escitalopram   (LEXAPRO ) 20 MG tablet TAKE 1 TABLET BY MOUTH EVERY  MORNING 90 tablet 1   ezetimibe  (ZETIA ) 10 MG tablet TAKE 1 TABLET BY MOUTH DAILY 90 tablet 3   gabapentin  (NEURONTIN ) 100 MG capsule Take 1 capsule (100 mg total) by mouth 3 (three) times daily. 90 capsule 1   HUMALOG  KWIKPEN 100 UNIT/ML KwikPen 10-15 units with meals 3 times a day, maximum 45 units/day. 30 mL 5   insulin  glargine (LANTUS  SOLOSTAR) 100 UNIT/ML Solostar Pen Inject 18 Units into the skin at bedtime. 15 mL 5   methimazole  (TAPAZOLE ) 5 MG tablet Take 1 tablet (5 mg total) by mouth every morning. 90 tablet 3   metoCLOPramide  (REGLAN ) 10 MG tablet Take 1 tablet (10 mg total) by mouth every 6 (six) hours. 30 tablet 0   metoprolol  tartrate (LOPRESSOR ) 50 MG tablet Take 50 mg by mouth 2 (two) times daily.     olmesartan -hydrochlorothiazide  (BENICAR  HCT) 40-25 MG tablet Take 1 tablet by mouth daily.     pantoprazole  (PROTONIX ) 40 MG tablet Take 1 tablet (40 mg total) by mouth 2 (two) times daily. 60 tablet 5   ranolazine  (RANEXA ) 500 MG 12 hr tablet Take 1 tablet (500 mg total) by mouth 2 (two) times daily. 180 tablet 0   senna-docusate (SENOKOT-S) 8.6-50 MG tablet Take 1 tablet by mouth 2 (two) times daily.     tamsulosin  (FLOMAX ) 0.4 MG CAPS capsule Take 1 capsule (0.4 mg total) by mouth at bedtime. 30 capsule 5   topiramate  (TOPAMAX ) 25 MG tablet TAKE 1 TABLET BY MOUTH TWICE DAILY 180 tablet 1   XARELTO  10 MG TABS tablet TAKE 1 TABLET BY MOUTH DAILY 100 tablet 2   zolpidem  (AMBIEN  CR) 12.5 MG CR tablet Take 1 tablet (12.5 mg total) by mouth at bedtime as needed for sleep. 30 tablet 0   No current facility-administered medications for this visit.   Allergies  Allergen Reactions   Asa [Aspirin ] Anaphylaxis and Swelling   Contrast Media [Iodinated Contrast Media] Anaphylaxis and Swelling   Imitrex [Sumatriptan] Anaphylaxis   Ms Contin  [Morphine ] Anaphylaxis and Swelling   Penicillins Swelling and Other (See Comments)    Mouth and  eyes swell   Chlorhexidine  Gluconate [Chlorhexidine ] Itching and Rash   Firvanq  [Vancomycin ] Other (See Comments)    Red Man's Syndrome   Cipro  [Ciprofloxacin  Hcl] Nausea And Vomiting   Nitrofuran Derivatives Anxiety   Wound Dressing Adhesive Itching and Rash     Review of Systems: All systems reviewed and negative except where noted in HPI.    No results found.  Physical Exam: There were no vitals taken for this visit. Constitutional: Pleasant,well-developed, ***female in no acute distress. HEENT: Normocephalic and atraumatic. Conjunctivae are normal. No scleral icterus. Neck supple.  Cardiovascular: Normal rate, regular rhythm.  Pulmonary/chest: Effort normal and breath sounds normal. No wheezing, rales or rhonchi. Abdominal: Soft, nondistended, nontender. Bowel sounds active throughout. There are no masses palpable. No hepatomegaly. Extremities: no edema Lymphadenopathy: No cervical adenopathy noted. Neurological: Alert and oriented to person place and time. Skin: Skin is warm and dry. No rashes noted. Psychiatric: Normal mood and affect. Behavior is normal.   ASSESSMENT: 53 y.o. female here for assessment of the following  No diagnosis found.  PLAN:   Ngetich, Dinah C, NP

## 2023-08-13 ENCOUNTER — Other Ambulatory Visit: Payer: Self-pay | Admitting: Gastroenterology

## 2023-08-13 ENCOUNTER — Ambulatory Visit: Admitting: Adult Health

## 2023-08-19 ENCOUNTER — Ambulatory Visit: Admitting: Podiatry

## 2023-08-19 ENCOUNTER — Telehealth: Payer: Self-pay | Admitting: *Deleted

## 2023-08-19 DIAGNOSIS — E1169 Type 2 diabetes mellitus with other specified complication: Secondary | ICD-10-CM

## 2023-08-19 MED ORDER — FREESTYLE LIBRE 14 DAY SENSOR MISC: 1.0000 | 0 refills | Status: DC | PRN

## 2023-08-19 NOTE — Telephone Encounter (Signed)
 Rx sent to pharmacy as requested.

## 2023-08-19 NOTE — Telephone Encounter (Signed)
 Copied from CRM [id number]. Topic: Clinical - Prescription Issue >> Aug 19, 2023  9:07 AM Susanna ORN wrote: Reason for CRM: Patient called in stating that she was sent 3 Continuous Glucose Sensor (FREESTYLE LIBRE 14 DAY SENSOR) MISC but one was defective. She is needing another one sent to her & wants to know if Dr. Roxan can send it to the pharmacy today? Please give the patient a call to advise and to let her know once it has been sent to the pharmacy. CB #: [id number].

## 2023-08-25 NOTE — Telephone Encounter (Unsigned)
 Copied from CRM [id number]. Topic: Clinical - Prescription Issue >> Aug 25, 2023  9:31 AM Lori [name] wrote: Reason for CRM: Continuous Glucose Sensor (FREESTYLE LIBRE 14 DAY SENSOR) MISC  .SABRASABRA  Pt [facility] to [facility] to [facility] rx but they said another pharmacy is sending it in the mail, so they denied giving her rx,   Please advise.

## 2023-09-01 ENCOUNTER — Encounter: Admitting: Physical Medicine and Rehabilitation

## 2023-09-02 ENCOUNTER — Telehealth: Payer: Self-pay | Admitting: Family

## 2023-09-02 DIAGNOSIS — E785 Hyperlipidemia, unspecified: Secondary | ICD-10-CM

## 2023-09-02 NOTE — Telephone Encounter (Signed)
 Whitney from [facility] pharmacy stating patient is requesting pen needles 31G for insulin . Not seeing on med list.    Copied from CRM [id number]. Topic: Clinical - Medication Refill >> Sep 02, 2023  5:09 PM Adrianna P wrote: Medication: 31 gauge pen needle  Has the patient contacted their pharmacy? Yes (Agent: If no, request that the patient contact the pharmacy for the refill. If patient does not wish to contact the pharmacy document the reason why and proceed with request.) (Agent: If yes, when and what did the pharmacy advise?)  This is the patient's preferred pharmacy:  [facility] [place]  [place], [place] - [address] [place] [address] Phone: [phone] Fax: [phone] Is this the correct pharmacy for this prescription? Yes If no, delete pharmacy and type the correct one.   Has the prescription been filled recently? No  Is the patient out of the medication? Yes  Has the patient been seen for an appointment in the last year OR does the patient have an upcoming appointment? Yes  Can we respond through MyChart? No  Agent: Please be advised that Rx refills may take up to 3 business days. We ask that you follow-up with your pharmacy.

## 2023-09-02 NOTE — Telephone Encounter (Signed)
 Copied from CRM [id number]. Topic: Clinical - Medication Refill >> Sep 02, 2023  5:09 PM Adrianna P wrote: Medication: 31 gauge pen needle  Has the patient contacted their pharmacy? Yes (Agent: If no, request that the patient contact the pharmacy for the refill. If patient does not wish to contact the pharmacy document the reason why and proceed with request.) (Agent: If yes, when and what did the pharmacy advise?)  This is the patient's preferred pharmacy:  [facility] [place]  - [place], [place] - [address] [place] [address] Phone: [phone] Fax: [phone]  [id number] - [place], [place] - [address] AT [facility] OF [address] [facility] [address] [place] [address] Phone: [phone] Fax: [phone]  [facility] - [place], [place] - [address] [place] [address] Phone: [phone] Fax: [phone]  [facility] - [place], [place] - [address] [place] [address] Phone: [phone] Fax: [phone]  [facility] LONG - [facility] [address] [place] [address] Phone: [phone] Fax: [phone]  [facility] [address] [place] [address] Phone: [phone] Fax: [phone]  Is this the correct pharmacy for this prescription? Yes If no, delete pharmacy and type the correct one.   Has the prescription been filled recently? No  Is the patient out of the medication? Yes  Has the patient been seen for an appointment in the last year OR does the patient have an upcoming appointment? Yes  Can we respond through MyChart? No  Agent: Please be advised that Rx refills may take up to 3 business days. We ask that you follow-up with your pharmacy.

## 2023-09-03 NOTE — Telephone Encounter (Signed)
 Patient diabetes is being managed by her endocrinologist.

## 2023-09-04 MED ORDER — INSULIN PEN NEEDLE 32G X 4 MM MISC: 3 refills | Status: DC

## 2023-09-04 NOTE — Telephone Encounter (Signed)
 Sent prescription for pen needles.

## 2023-09-04 NOTE — Addendum Note (Signed)
 Addended by: Josealfredo Adkins, IRAQ on: 09/04/2023 01:21 PM   Modules accepted: Orders

## 2023-09-08 ENCOUNTER — Other Ambulatory Visit: Payer: Self-pay | Admitting: Family

## 2023-09-08 DIAGNOSIS — E1169 Type 2 diabetes mellitus with other specified complication: Secondary | ICD-10-CM

## 2023-09-08 DIAGNOSIS — F5101 Primary insomnia: Secondary | ICD-10-CM

## 2023-09-08 NOTE — Telephone Encounter (Unsigned)
 Copied from CRM [id number]. Topic: Clinical - Medication Refill >> Sep 08, 2023  2:00 PM Adrianna P wrote: Medication: insulin  glargine (LANTUS  SOLOSTAR) 100 UNIT/ML Solostar Pen HUMALOG  KWIKPEN 100 UNIT/ML KwikPen zolpidem  (AMBIEN  CR) 12.5 MG CR tablet  Has the patient contacted their pharmacy? Yes (Agent: If no, request that the patient contact the pharmacy for the refill. If patient does not wish to contact the pharmacy document the reason why and proceed with request.) (Agent: If yes, when and what did the pharmacy advise?)  This is the patient's preferred pharmacy:  [facility] [place]  [place], [place] - [address] [place] [address] Phone: [phone] Fax: [phone]  [id number] - [place], [place] - [address] AT [facility] OF [address] [facility] [address] [place] [address] Phone: [phone] Fax: [phone]  [facility] - [place], [place] - [address] [place] [address] Phone: [phone] Fax: [phone]  [facility] - [place], [place] - [address] [place] [address] Phone: [phone] Fax: [phone]  [facility] - [facility] [address] [place] [address] Phone: [phone] Fax: [phone]  [facility] [address] [place] [address] Phone: [phone] Fax: [phone]  Is this the correct pharmacy for this prescription? Yes If no, delete pharmacy and type the correct one.   Has the prescription been filled recently? No  Is the patient out of the medication? Yes  Has the patient been seen for an appointment in the last year OR does the patient have an upcoming appointment? Yes  Can we respond through MyChart? No  Agent: Please be advised that Rx refills may take up to 3 business days. We ask that you  follow-up with your pharmacy.

## 2023-09-09 ENCOUNTER — Encounter: Attending: Physical Medicine & Rehabilitation | Admitting: Physical Medicine & Rehabilitation

## 2023-09-09 DIAGNOSIS — I69398 Other sequelae of cerebral infarction: Secondary | ICD-10-CM | POA: Insufficient documentation

## 2023-09-09 DIAGNOSIS — R252 Cramp and spasm: Secondary | ICD-10-CM | POA: Insufficient documentation

## 2023-09-09 MED ORDER — ZOLPIDEM TARTRATE ER 12.5 MG PO TBCR: 12.5000 mg | EXTENDED_RELEASE_TABLET | Freq: Every evening | ORAL | 0 refills | Status: DC | PRN

## 2023-09-09 MED ORDER — HUMALOG KWIKPEN 100 UNIT/ML ~~LOC~~ SOPN: PEN_INJECTOR | SUBCUTANEOUS | 5 refills | Status: DC

## 2023-09-09 MED ORDER — LANTUS SOLOSTAR 100 UNIT/ML ~~LOC~~ SOPN: 18.0000 [IU] | PEN_INJECTOR | Freq: Every day | SUBCUTANEOUS | 5 refills | Status: DC

## 2023-09-09 NOTE — Telephone Encounter (Signed)
 Patient has Ambien  medication that needs to be refilled.   Patient has request for refill on 09/09/2023. Patient last refill was 07/31/2023. Patient has contract on file dated 08/06/2023.  Patient has upcoming appointment 10/27/2023. Updated contract/Sign contract was added to appointment notes. Medication pend and sent to PCP (Ngetich, Dinah C, NP) for approval.

## 2023-09-10 ENCOUNTER — Other Ambulatory Visit: Payer: Self-pay

## 2023-09-10 ENCOUNTER — Ambulatory Visit: Payer: Self-pay

## 2023-09-10 DIAGNOSIS — Z794 Long term (current) use of insulin: Secondary | ICD-10-CM

## 2023-09-10 DIAGNOSIS — F5101 Primary insomnia: Secondary | ICD-10-CM

## 2023-09-10 DIAGNOSIS — E1169 Type 2 diabetes mellitus with other specified complication: Secondary | ICD-10-CM

## 2023-09-10 MED ORDER — LANTUS SOLOSTAR 100 UNIT/ML ~~LOC~~ SOPN: 18.0000 [IU] | PEN_INJECTOR | Freq: Every day | SUBCUTANEOUS | 5 refills | Status: AC

## 2023-09-10 MED ORDER — HUMALOG KWIKPEN 100 UNIT/ML ~~LOC~~ SOPN: PEN_INJECTOR | SUBCUTANEOUS | 5 refills | Status: AC

## 2023-09-10 NOTE — Telephone Encounter (Signed)
 Patient called, left VM to return the call to the office to speak to NT.     Copied from CRM [id number]. Topic: Clinical - Medical Advice >> Sep 10, 2023  2:23 PM Vivian Z wrote: Reason for CRM: Patient calling to request medication for constipation, she stated that she has tried over the counter options and has not been working.

## 2023-09-10 NOTE — Telephone Encounter (Signed)
 Medication already refilled by Eubanks,NP

## 2023-09-10 NOTE — Telephone Encounter (Signed)
 Copied from CRM #[id number]. Topic: Clinical - Medication Question >> Sep 10, 2023  3:43 PM Suzette B wrote: Reason for CRM: patient called from [phone] requesting advice for a prescription which helps relieve constipation, she stated that she is currently taking and requested refill on the SenoKot-S, however its not really working for her. Please call patient to assist with this matter Please Advise  Message sent to Ngetich, Dinah C, NP

## 2023-09-10 NOTE — Telephone Encounter (Signed)
 Copied from CRM [id number]. Topic: Clinical - Medication Refill >> Sep 08, 2023  2:00 PM Adrianna P wrote: Medication: insulin  glargine (LANTUS  SOLOSTAR) 100 UNIT/ML Solostar Pen HUMALOG  KWIKPEN 100 UNIT/ML KwikPen zolpidem  (AMBIEN  CR) 12.5 MG CR tablet  Has the patient contacted their pharmacy? Yes (Agent: If no, request that the patient contact the pharmacy for the refill. If patient does not wish to contact the pharmacy document the reason why and proceed with request.) (Agent: If yes, when and what did the pharmacy advise?)  This is the patient's preferred pharmacy:  [facility] [place]  [place], [place] - [address] [place] [address] Phone: [phone] Fax: [phone]  [id number] - [place], [place] - [address] AT [facility] [address] [facility] [address] [place] [address] Phone: [phone] Fax: [phone]  [facility] - [place], [place] - [address] [place] [address] Phone: [phone] Fax: [phone]  [facility] - [place], [place] - [address] [place] [address] Phone: [phone] Fax: [phone]  [facility] - [facility] [address] [place] [address] Phone: [phone] Fax: [phone]  [facility] [address] [place] [address] Phone: [phone] Fax: [phone]  Is this the correct pharmacy for this prescription? Yes If no, delete pharmacy and type the correct one.   Has the prescription been filled recently? No  Is the patient out of the medication? Yes  Has the patient been seen for an appointment in the last year OR does the patient have an upcoming appointment? Yes  Can we respond through MyChart? No  Agent: Please be advised that Rx refills may take up to 3 business days. We ask that you  follow-up with your pharmacy.

## 2023-09-10 NOTE — Telephone Encounter (Signed)
 Spoke with patient to let her know in regards to medication that was to the pharmacy that she will need to speak with her in Endocrinologist to refill your insulin  and diabetic supply.  I advise her to give them a call and that we will reorder the Ambien  to Ngetich, Dinah C, NP to sign. Please Advise   Message sent to Ngetich, Dinah C, NP

## 2023-09-10 NOTE — Telephone Encounter (Signed)
 [name] or Action Required?: Action required by provider: clinical question for provider.  Patient was last seen in primary care on 07/31/2023 by Medina-Vargas, Jereld BROCKS, NP.  Called Nurse Triage reporting Constipation.  Symptoms began several days ago.  Interventions attempted: OTC medications: senokot and metamucil.  Symptoms are: gradually worsening.  Triage Disposition: No disposition on file.  Patient/caregiver understands and will follow disposition?:  Reason for Disposition  Unable to have a bowel movement (BM) without laxative or enema  Answer Assessment - Initial Assessment Questions This RN recommended patient be seen. Patient refused x multiple attempts. ED precautions reviewed, pt verbalized understanding. Patient made aware this RN would route a message to PCP office requesting further advisement.   1. STOOL PATTERN OR FREQUENCY: How often do you have a bowel movement (BM)?  (Normal range: 3 times a day to every 3 days)  When was your last BM?       Patient states last BM was three days ago. Denies other symptoms.   3. ONSET: When did the constipation begin?     3 days  4. LAXATIVES: Have you been using any stool softeners, laxatives, or enemas?  If Yes, ask What are you using, how often, and when was the last time?    Senokot  Protocols used: Constipation-A-AH

## 2023-09-10 NOTE — Telephone Encounter (Signed)
 Patient called stating she needs a refill on Lantus  and Humalog  and patient was unsure what her MD name was. RN called back and provided the MD name to patient and sent in refills. Patient made aware.

## 2023-09-10 NOTE — Telephone Encounter (Signed)
 Second attempt: LVM for patient to return call to [phone]   Copied from CRM [id number]. Topic: Clinical - Medical Advice >> Sep 10, 2023  2:23 PM Vivian Z wrote: Reason for CRM: Patient calling to request medication for constipation, she stated that she has tried over the counter options and has not been working.

## 2023-09-11 ENCOUNTER — Other Ambulatory Visit: Payer: Self-pay | Admitting: Family

## 2023-09-11 NOTE — Telephone Encounter (Signed)
Patient is aware of providers response and verbalized understanding  

## 2023-09-11 NOTE — Telephone Encounter (Signed)
 Spoke with patient and she again refused an appointment (in person and video). Patient states  I just want some medication for the constipation.

## 2023-09-11 NOTE — Telephone Encounter (Signed)
 Recommend Bisacodyl  5 mg tablet one by mouth x 1 dose.May repeat x 1 dose if still no bowel movement.

## 2023-09-19 DIAGNOSIS — I2510 Atherosclerotic heart disease of native coronary artery without angina pectoris: Secondary | ICD-10-CM | POA: Diagnosis not present

## 2023-09-19 DIAGNOSIS — E119 Type 2 diabetes mellitus without complications: Secondary | ICD-10-CM | POA: Diagnosis not present

## 2023-09-19 DIAGNOSIS — E079 Disorder of thyroid, unspecified: Secondary | ICD-10-CM | POA: Diagnosis not present

## 2023-09-19 DIAGNOSIS — I10 Essential (primary) hypertension: Secondary | ICD-10-CM | POA: Diagnosis not present

## 2023-09-19 DIAGNOSIS — I69354 Hemiplegia and hemiparesis following cerebral infarction affecting left non-dominant side: Secondary | ICD-10-CM | POA: Diagnosis not present

## 2023-09-22 DIAGNOSIS — I10 Essential (primary) hypertension: Secondary | ICD-10-CM | POA: Diagnosis not present

## 2023-09-22 DIAGNOSIS — I69354 Hemiplegia and hemiparesis following cerebral infarction affecting left non-dominant side: Secondary | ICD-10-CM | POA: Diagnosis not present

## 2023-09-22 DIAGNOSIS — E079 Disorder of thyroid, unspecified: Secondary | ICD-10-CM | POA: Diagnosis not present

## 2023-09-22 DIAGNOSIS — I2510 Atherosclerotic heart disease of native coronary artery without angina pectoris: Secondary | ICD-10-CM | POA: Diagnosis not present

## 2023-09-22 DIAGNOSIS — E119 Type 2 diabetes mellitus without complications: Secondary | ICD-10-CM | POA: Diagnosis not present

## 2023-09-25 DIAGNOSIS — E079 Disorder of thyroid, unspecified: Secondary | ICD-10-CM | POA: Diagnosis not present

## 2023-09-25 DIAGNOSIS — E119 Type 2 diabetes mellitus without complications: Secondary | ICD-10-CM | POA: Diagnosis not present

## 2023-09-25 DIAGNOSIS — I10 Essential (primary) hypertension: Secondary | ICD-10-CM | POA: Diagnosis not present

## 2023-09-25 DIAGNOSIS — I2510 Atherosclerotic heart disease of native coronary artery without angina pectoris: Secondary | ICD-10-CM | POA: Diagnosis not present

## 2023-09-25 DIAGNOSIS — I69354 Hemiplegia and hemiparesis following cerebral infarction affecting left non-dominant side: Secondary | ICD-10-CM | POA: Diagnosis not present

## 2023-09-26 ENCOUNTER — Other Ambulatory Visit: Payer: Self-pay | Admitting: Family

## 2023-09-26 DIAGNOSIS — I2510 Atherosclerotic heart disease of native coronary artery without angina pectoris: Secondary | ICD-10-CM | POA: Diagnosis not present

## 2023-09-26 DIAGNOSIS — I69354 Hemiplegia and hemiparesis following cerebral infarction affecting left non-dominant side: Secondary | ICD-10-CM | POA: Diagnosis not present

## 2023-09-26 DIAGNOSIS — I10 Essential (primary) hypertension: Secondary | ICD-10-CM | POA: Diagnosis not present

## 2023-09-26 DIAGNOSIS — K5901 Slow transit constipation: Secondary | ICD-10-CM

## 2023-09-26 DIAGNOSIS — E079 Disorder of thyroid, unspecified: Secondary | ICD-10-CM | POA: Diagnosis not present

## 2023-09-26 DIAGNOSIS — E119 Type 2 diabetes mellitus without complications: Secondary | ICD-10-CM | POA: Diagnosis not present

## 2023-09-26 MED ORDER — SENNOSIDES-DOCUSATE SODIUM 8.6-50 MG PO TABS: 1.0000 | ORAL_TABLET | Freq: Two times a day (BID) | ORAL | 1 refills | Status: DC

## 2023-09-26 NOTE — Telephone Encounter (Signed)
 Medication hasn't been refilled by PCP Ngetich, Dinah C, NP . Medication pend and sent for approval.

## 2023-09-26 NOTE — Telephone Encounter (Unsigned)
 Copied from CRM [id number]. Topic: Clinical - Medication Refill >> Sep 26, 2023 10:04 AM Suzette B wrote: Medication:  senna-docusate (SENOKOT-S) 8.6-50 MG tablet  Has the patient contacted their pharmacy? Yes  This is the patient's preferred pharmacy:  [facility] [place], [place] - [address] [place] [address] Phone: [phone] Fax: [phone]   Is this the correct pharmacy for this prescription? Yes If no, delete pharmacy and type the correct one.   Has the prescription been filled recently? No  Is the patient out of the medication? Yes  Has the patient been seen for an appointment in the last year OR does the patient have an upcoming appointment? Yes  Can we respond through MyChart? No  Agent: Please be advised that Rx refills may take up to 3 business days. We ask that you follow-up with your pharmacy.

## 2023-09-29 ENCOUNTER — Telehealth: Payer: Self-pay | Admitting: *Deleted

## 2023-09-29 DIAGNOSIS — I2510 Atherosclerotic heart disease of native coronary artery without angina pectoris: Secondary | ICD-10-CM | POA: Diagnosis not present

## 2023-09-29 DIAGNOSIS — E079 Disorder of thyroid, unspecified: Secondary | ICD-10-CM | POA: Diagnosis not present

## 2023-09-29 DIAGNOSIS — E119 Type 2 diabetes mellitus without complications: Secondary | ICD-10-CM | POA: Diagnosis not present

## 2023-09-29 DIAGNOSIS — I10 Essential (primary) hypertension: Secondary | ICD-10-CM | POA: Diagnosis not present

## 2023-09-29 DIAGNOSIS — I69354 Hemiplegia and hemiparesis following cerebral infarction affecting left non-dominant side: Secondary | ICD-10-CM | POA: Diagnosis not present

## 2023-09-29 NOTE — Telephone Encounter (Signed)
 Requesting verbal order to continue with [facility] PT recert 1wk8.  Approval given.

## 2023-09-30 DIAGNOSIS — E119 Type 2 diabetes mellitus without complications: Secondary | ICD-10-CM | POA: Diagnosis not present

## 2023-09-30 DIAGNOSIS — E079 Disorder of thyroid, unspecified: Secondary | ICD-10-CM | POA: Diagnosis not present

## 2023-09-30 DIAGNOSIS — I2510 Atherosclerotic heart disease of native coronary artery without angina pectoris: Secondary | ICD-10-CM | POA: Diagnosis not present

## 2023-09-30 DIAGNOSIS — I10 Essential (primary) hypertension: Secondary | ICD-10-CM | POA: Diagnosis not present

## 2023-09-30 DIAGNOSIS — I69354 Hemiplegia and hemiparesis following cerebral infarction affecting left non-dominant side: Secondary | ICD-10-CM | POA: Diagnosis not present

## 2023-10-01 ENCOUNTER — Encounter: Payer: Self-pay | Admitting: Adult Health

## 2023-10-01 ENCOUNTER — Ambulatory Visit (INDEPENDENT_AMBULATORY_CARE_PROVIDER_SITE_OTHER): Admitting: Adult Health

## 2023-10-01 ENCOUNTER — Other Ambulatory Visit: Payer: Self-pay | Admitting: Family

## 2023-10-01 VITALS — BP 136/89 | HR 113 | Temp 97.6°F | Resp 20 | Ht 62.0 in | Wt 177.8 lb

## 2023-10-01 DIAGNOSIS — I10 Essential (primary) hypertension: Secondary | ICD-10-CM

## 2023-10-01 DIAGNOSIS — E1169 Type 2 diabetes mellitus with other specified complication: Secondary | ICD-10-CM

## 2023-10-01 DIAGNOSIS — Z8673 Personal history of transient ischemic attack (TIA), and cerebral infarction without residual deficits: Secondary | ICD-10-CM | POA: Diagnosis not present

## 2023-10-01 DIAGNOSIS — F5101 Primary insomnia: Secondary | ICD-10-CM | POA: Diagnosis not present

## 2023-10-01 DIAGNOSIS — R0989 Other specified symptoms and signs involving the circulatory and respiratory systems: Secondary | ICD-10-CM

## 2023-10-01 DIAGNOSIS — E0590 Thyrotoxicosis, unspecified without thyrotoxic crisis or storm: Secondary | ICD-10-CM

## 2023-10-01 DIAGNOSIS — Z794 Long term (current) use of insulin: Secondary | ICD-10-CM | POA: Diagnosis not present

## 2023-10-01 DIAGNOSIS — K5901 Slow transit constipation: Secondary | ICD-10-CM

## 2023-10-01 LAB — POCT INFLUENZA A/B
Influenza A, POC: NEGATIVE
Influenza B, POC: NEGATIVE

## 2023-10-01 LAB — POC COVID19 BINAXNOW: SARS Coronavirus 2 Ag: NEGATIVE

## 2023-10-01 MED ORDER — SENNOSIDES-DOCUSATE SODIUM 8.6-50 MG PO TABS: 2.0000 | ORAL_TABLET | Freq: Two times a day (BID) | ORAL | 1 refills | Status: DC

## 2023-10-01 MED ORDER — BENZONATATE 100 MG PO CAPS: 100.0000 mg | ORAL_CAPSULE | Freq: Three times a day (TID) | ORAL | 0 refills | Status: DC | PRN

## 2023-10-01 MED ORDER — POLYETHYLENE GLYCOL 3350 17 GM/SCOOP PO POWD: 17.0000 g | Freq: Every day | ORAL | 1 refills | Status: DC

## 2023-10-01 NOTE — Progress Notes (Signed)
 [facility]  Provider:  Jereld Serum DNP  Code Status:  Full Code  Goals of Care:     06/27/2023    2:24 PM  Advanced Directives  Does Patient Have a Medical Advance Directive? No  Would patient like information on creating a medical advance directive? No - Patient declined     Chief Complaint  Patient presents with   Nasal Congestion    Chest congestion    Discussed the use of AI scribe software for clinical note transcription with the patient, who gave verbal consent to proceed.  HPI: Patient is a 53 y.o. female seen today for an acute visit for nasal congestion. She has a history of CVA and DVT.  She has been experiencing chest congestion for the past month, accompanied by a dry cough. There is no fever or chills. She has undergone testing for COVID-19 and influenza A and B, all of which were negative. A chest x-ray was performed in May.  She has difficulty sleeping and uses zolpidem  12.5 mg at bedtime as needed, which her husband noted made her 'mean.' She has tried melatonin without success and has previously used Restoril.  She experiences constipation, noting incomplete bowel movements. She currently takes Miralax  daily and Senna, which is not always included in her medication deliveries.  Her past medical history includes a cerebrovascular accident (CVA) for which she takes Plavix  75 mg daily and Zetia  10 mg daily. 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 For hypertension, she takes olmesartan  HCTZ 40/25 mg daily and metoprolol  tartrate 50 mg twice a day. She manages her diabetes with Lantus  18 units at bedtime and Humalog  10-15 units three times a day, with a recent blood sugar reading of 139 mg/dL.    Past Medical History:  Diagnosis Date   Acute bronchitis 08/24/2021   Acute cystitis 12/26/2022   Acute metabolic encephalopathy 11/04/2022   Acute renal failure (ARF) (HCC) 04/27/2023   AKI  (acute kidney injury) (HCC) 04/30/2022   Aspiration pneumonia (HCC) 10/13/2022   CAD (coronary artery disease)    Cerebral infarction, unspecified (HCC) 09/27/2012   Chest pain of uncertain etiology 10/06/2022   Depression    Diabetes mellitus without complication (HCC)    Diabetic hyperosmolar non-ketotic state (HCC) 01/19/2023   DKA (diabetic ketoacidosis) (HCC) 02/19/2021   History of CT scan    History of mammogram    History of MRI    Hypertension    Pheochromocytoma    Reversible cerebrovascular vasoconstriction syndrome    Spell of abnormal behavior    Stroke [facility])    Thyroid  disease     Past Surgical History:  Procedure Laterality Date   ABDOMINAL HYSTERECTOMY     BIOPSY  02/06/2023   Procedure: BIOPSY;  Surgeon: Leigh Elspeth SQUIBB, MD;  Location: [facility];  Service: Gastroenterology;;   CESAREAN SECTION     3   CORONARY ARTERY BYPASS GRAFT     ESOPHAGOGASTRODUODENOSCOPY (EGD) WITH PROPOFOL  N/A 02/06/2023   Procedure: ESOPHAGOGASTRODUODENOSCOPY (EGD) WITH PROPOFOL ;  Surgeon: Leigh Elspeth SQUIBB, MD;  Location: [facility];  Service: Gastroenterology;  Laterality: N/A;   Right adrenal gland removal for pheochromocytoma Right     Allergies  Allergen Reactions   Asa [Aspirin ] Anaphylaxis and Swelling   Contrast Media [Iodinated Contrast Media] Anaphylaxis and Swelling   Imitrex [Sumatriptan] Anaphylaxis   Ms Contin  [Morphine ] Anaphylaxis and Swelling   Penicillins Swelling and Other (See  Comments)    Mouth and eyes swell   Chlorhexidine  Gluconate [Chlorhexidine ] Itching and Rash   Firvanq  [Vancomycin ] Other (See Comments)    Red Man's Syndrome   Cipro  [Ciprofloxacin  Hcl] Nausea And Vomiting   Nitrofuran Derivatives Anxiety   Wound Dressing Adhesive Itching and Rash    Outpatient Encounter Medications as of 10/01/2023  Medication Sig   acetaminophen  (TYLENOL ) 325 MG tablet Take 2 tablets (650 mg total) by mouth every 6 (six) hours as needed for mild pain  (pain score 1-3) (or Fever >/= 101).   albuterol  (VENTOLIN  HFA) 108 (90 Base) MCG/ACT inhaler INHALE 2 PUFFS BY MOUTH EVERY 6 HOURS AS NEEDED FOR WHEEZING AND SHORTNESS OF BREATH   atorvastatin  (LIPITOR ) 80 MG tablet Take 1 tablet (80 mg total) by mouth every evening.   baclofen  (LIORESAL ) 10 MG tablet Take 1 tablet (10 mg total) by mouth 3 (three) times daily.   benzonatate  (TESSALON  PERLES) 100 MG capsule Take 1 capsule (100 mg total) by mouth 3 (three) times daily as needed.   clopidogrel  (PLAVIX ) 75 MG tablet TAKE 1 TABLET BY MOUTH DAILY   Continuous Glucose Sensor (FREESTYLE LIBRE 14 DAY SENSOR) MISC 1 each by Does not apply route as needed. DX: E11.69   Continuous Glucose Sensor (FREESTYLE LIBRE 3 PLUS SENSOR) MISC 1 each by Does not apply route continuous. Change every 15 days.   cyclobenzaprine  (FLEXERIL ) 5 MG tablet Take 1 tablet (5 mg total) by mouth 3 (three) times daily as needed for muscle spasms.   escitalopram  (LEXAPRO ) 20 MG tablet TAKE 1 TABLET BY MOUTH EVERY  MORNING   ezetimibe  (ZETIA ) 10 MG tablet TAKE 1 TABLET BY MOUTH DAILY   gabapentin  (NEURONTIN ) 100 MG capsule Take 1 capsule (100 mg total) by mouth 3 (three) times daily.   HUMALOG  KWIKPEN 100 UNIT/ML KwikPen 10-15 units with meals 3 times a day, maximum 45 units/day.   insulin  glargine (LANTUS  SOLOSTAR) 100 UNIT/ML Solostar Pen Inject 18 Units into the skin at bedtime.   Insulin  Pen Needle 32G X 4 MM MISC Use 4x a day   methimazole  (TAPAZOLE ) 5 MG tablet Take 1 tablet (5 mg total) by mouth every morning.   metoCLOPramide  (REGLAN ) 10 MG tablet Take 1 tablet (10 mg total) by mouth every 6 (six) hours.   metoprolol  tartrate (LOPRESSOR ) 50 MG tablet Take 50 mg by mouth 2 (two) times daily.   pantoprazole  (PROTONIX ) 40 MG tablet Take 1 tablet (40 mg total) by mouth 2 (two) times daily.   polyethylene glycol powder (GLYCOLAX /MIRALAX ) 17 GM/SCOOP powder Take 17 g by mouth daily.   tamsulosin  (FLOMAX ) 0.4 MG CAPS capsule Take 1  capsule (0.4 mg total) by mouth at bedtime.   topiramate  (TOPAMAX ) 25 MG tablet TAKE 1 TABLET BY MOUTH TWICE DAILY   zolpidem  (AMBIEN  CR) 12.5 MG CR tablet Take 1 tablet (12.5 mg total) by mouth at bedtime as needed for sleep.   [DISCONTINUED] senna-docusate (SENOKOT-S) 8.6-50 MG tablet Take 1 tablet by mouth 2 (two) times daily.   olmesartan -hydrochlorothiazide  (BENICAR  HCT) 40-25 MG tablet Take 1 tablet by mouth daily. (Patient not taking: Reported on 10/01/2023)   ranolazine  (RANEXA ) 500 MG 12 hr tablet Take 1 tablet (500 mg total) by mouth 2 (two) times daily. (Patient not taking: Reported on 10/01/2023)   senna-docusate (SENOKOT-S) 8.6-50 MG tablet Take 2 tablets by mouth 2 (two) times daily.   sucralfate  (CARAFATE ) 1 GM/10ML suspension SHAKE LIQUID AND TAKE 10 ML BY MOUTH AT BEDTIME (Patient not taking:  Reported on 10/01/2023)   XARELTO  10 MG TABS tablet TAKE 1 TABLET BY MOUTH DAILY (Patient not taking: Reported on 10/01/2023)   No facility-administered encounter medications on file as of 10/01/2023.    Review of Systems:  Review of Systems  Constitutional:  Negative for appetite change, chills, fatigue and fever.  HENT:  Negative for congestion, hearing loss, rhinorrhea and sore throat.   Eyes: Negative.   Respiratory:  Positive for cough. Negative for shortness of breath and wheezing.   Cardiovascular:  Negative for chest pain, palpitations and leg swelling.  Gastrointestinal:  Positive for constipation. Negative for abdominal pain, diarrhea, nausea and vomiting.  Genitourinary:  Negative for dysuria.  Musculoskeletal:  Negative for arthralgias, back pain and myalgias.  Skin:  Negative for color change, rash and wound.  Neurological:  Negative for dizziness, weakness and headaches.  Psychiatric/Behavioral:  Negative for behavioral problems. The patient is not nervous/anxious.     Health Maintenance  Topic Date Due   FOOT EXAM  Never done   Diabetic kidney evaluation - Urine ACR   Never done   Pneumococcal Vaccine: 50+ Years (1 of 2 - PCV) Never done   Hepatitis B Vaccines 19-59 Average Risk (1 of 3 - 19+ 3-dose series) Never done   Colonoscopy  Never done   MAMMOGRAM  Never done   Zoster Vaccines- Shingrix (1 of 2) Never done   INFLUENZA VACCINE  09/05/2023   COVID-19 Vaccine (3 - 2024-25 season) 12/26/2023 (Originally 10/06/2022)   HEMOGLOBIN A1C  01/29/2024   OPHTHALMOLOGY EXAM  02/19/2024   Diabetic kidney evaluation - eGFR measurement  07/31/2024   Hepatitis C Screening  Completed   HIV Screening  Completed   HPV VACCINES  Aged Out   Meningococcal B Vaccine  Aged Out   DTaP/Tdap/Td  Discontinued    Physical Exam: Vitals:   10/01/23 1044  BP: 136/89  Pulse: (!) 113  Resp: 20  Temp: 97.6 F (36.4 C)  SpO2: 97%  Weight: 177 lb 12.8 oz (80.6 kg)  Height: 5' 2 (1.575 m)   Body mass index is 32.52 kg/m. Physical Exam Constitutional:      Appearance: Normal appearance.  HENT:     Head: Normocephalic and atraumatic.     Nose: Nose normal.     Mouth/Throat:     Mouth: Mucous membranes are moist.  Eyes:     Conjunctiva/sclera: Conjunctivae normal.  Cardiovascular:     Rate and Rhythm: Normal rate and regular rhythm.  Pulmonary:     Effort: Pulmonary effort is normal.     Breath sounds: Normal breath sounds.  Abdominal:     General: Bowel sounds are normal.     Palpations: Abdomen is soft.  Musculoskeletal:     Cervical back: Normal range of motion.  Skin:    General: Skin is warm and dry.  Neurological:     General: No focal deficit present.     Mental Status: She is alert and oriented to person, place, and time.     Comments: LUE weakness  Psychiatric:        Mood and Affect: Mood normal.        Behavior: Behavior normal.        Thought Content: Thought content normal.        Judgment: Judgment normal.     Labs reviewed: Basic Metabolic Panel: Recent Labs    12/02/22 0530 12/05/22 0500 01/02/23 0440 01/03/23 1104  04/29/23 0519 04/30/23 0520 05/11/23 9148 05/12/23 0529 05/13/23  9546 05/15/23 1126 06/03/23 0530 06/04/23 0342 06/10/23 0410 06/15/23 0744 08/01/23 1621  NA 136   < > 130*   < > 144   < > 135   < > 131*   < >  --    < > 136 140 136  K 4.0   < > 3.3*   < > 3.8   < > 2.9*   < > 3.2*   < >  --    < > 3.7 3.4* 4.3  CL 105   < > 97*   < > 110   < > 102   < > 102   < >  --    < > 108 107 105  CO2 23   < > 20*   < > 25   < > 22   < > 23   < >  --    < > 21* 21* 19*  GLUCOSE 196*   < > 235*   < > 103*   < > 204*   < > 180*   < >  --    < > 166* 182* 254*  BUN 20   < > 8   < > 25*   < > 10   < > 10   < >  --    < > 6 6 15   CREATININE 0.94   < > 0.77   < > 1.46*   < > 0.72   < > 0.80   < >  --    < > 0.80 0.78 0.72  CALCIUM  8.9   < > 8.5*   < > 9.1   < > 8.4*   < > 7.9*   < >  --    < > 8.2* 9.4 9.4  MG 1.7  --  1.7   < > 1.7  --  1.4*  --  1.7  --   --   --   --   --   --   PHOS 4.1  --  3.3  --  3.5  --   --   --   --   --   --   --   --   --   --   TSH  --   --   --    < > 0.475  --  0.437  --   --   --  0.416  --   --   --   --    < > = values in this interval not displayed.   Liver Function Tests: Recent Labs    06/09/23 0304 06/10/23 0410 06/15/23 0744  AST 11* 11* 14*  ALT 11 11 11   ALKPHOS 57 56 60  BILITOT 0.5 0.4 0.6  PROT 5.5* 5.4* 6.9  ALBUMIN  2.4* 2.3* 3.3*   Recent Labs    05/29/23 1257 06/02/23 2328 06/15/23 0744  LIPASE 22 19 38   Recent Labs    11/22/22 0920 01/31/23 0107  AMMONIA 45* 13   CBC: Recent Labs    06/02/23 2328 06/04/23 0342 06/10/23 0410 06/15/23 0744 06/27/23 1459  WBC 11.3*   < > 7.3 14.3* 7.7  NEUTROABS 10.5*  --   --  11.0* 4,397  HGB 11.1*   < > 9.6* 12.2 13.1  HCT 33.3*   < > 28.5* 36.6 43.2  MCV 81.0   < > 80.3 78.7* 87.1  PLT 348   < > 447* 640* 443*   < > =  values in this interval not displayed.   Lipid Panel: Recent Labs    10/07/22 0311 04/25/23 1430  CHOL 132 273*  HDL 35* 55  LDLCALC 66 187*  TRIG 157* 165*   CHOLHDL 3.8 5.0*   Lab Results  Component Value Date   HGBA1C 9.0 (A) 07/30/2023    Procedures since last visit: No results found.  Assessment/Plan  1. Chest congestion (Primary) -  Chronic dry cough for one month. COVID-19 and influenza ruled out. Possible pneumonia. - Order chest x-ray at [facility] to rule out pneumonia. - POC COVID-19 BinaxNow negative - POCT Influenza A/B - DG Chest 2 View - benzonatate  (TESSALON  PERLES) 100 MG capsule; Take 1 capsule (100 mg total) by mouth 3 (three) times daily as needed.  Dispense: 20 capsule; Refill: 0  2. Primary insomnia -  patient wants to continue taking Zolpidem   3. Slow transit constipation -  Incomplete bowel movements despite Miralax  and Senna. - Increase Senna to two tablets twice a day. - Continue Miralax  daily, adjust as needed based on bowel movement frequency. - senna-docusate (SENOKOT-S) 8.6-50 MG tablet; Take 2 tablets by mouth 2 (two) times daily.  Dispense: 360 tablet; Refill: 1 - polyethylene glycol powder (GLYCOLAX /MIRALAX ) 17 GM/SCOOP powder; Take 17 g by mouth daily.  Dispense: 3350 g; Refill: 1  4. Type 2 diabetes mellitus with other specified complication, with long-term current use of insulin  (HCC)left Lab Results  Component Value Date   HGBA1C 9.0 (A) 07/30/2023   -  Blood sugar level 139 mg/dL. Current regimen includes Lantus  and Humalog .  5. Essential hypertension -  Managed with olmesartan  HCTZ and metoprolol .  6. History of CVA (cerebrovascular accident) -  has left-sided weakness -  continue Atorvastatin  80 mg in the evening -  continue Plavix  75 mg daily - For home use only DME Other see comment - For home use only DME Other see comment Discussion about hospital bed and supplies. - Send DME order for hospital bed, wipes, grips, bed pads, and gloves to [facility]. -  needs a hospital bed because of her medical conditions requiring positioning of the bed in ways not feasible with  ordinary bed. pillows and wedges have been considered and will not allow the correct positioning for the patient. This patient requires frequent changes in their positioning due to physical condition and a hospital bed will alleviate this problem. Will order hospital bed at this time.     Labs/tests ordered:  chest x-ray, POC COVID-19, POC Influenza A/B   Return if symptoms worsen or fail to improve.  Janelie Goltz Medina-Vargas, NP

## 2023-10-01 NOTE — Telephone Encounter (Signed)
 Last filled by Endocrinologist Pended and sent to [facility] for approval/denial.

## 2023-10-02 DIAGNOSIS — I10 Essential (primary) hypertension: Secondary | ICD-10-CM | POA: Diagnosis not present

## 2023-10-02 DIAGNOSIS — E079 Disorder of thyroid, unspecified: Secondary | ICD-10-CM | POA: Diagnosis not present

## 2023-10-02 DIAGNOSIS — I69354 Hemiplegia and hemiparesis following cerebral infarction affecting left non-dominant side: Secondary | ICD-10-CM | POA: Diagnosis not present

## 2023-10-02 DIAGNOSIS — I2510 Atherosclerotic heart disease of native coronary artery without angina pectoris: Secondary | ICD-10-CM | POA: Diagnosis not present

## 2023-10-02 DIAGNOSIS — E119 Type 2 diabetes mellitus without complications: Secondary | ICD-10-CM | POA: Diagnosis not present

## 2023-10-02 NOTE — Telephone Encounter (Signed)
 Medication managed by Endocrinologist.please call office for refill.

## 2023-10-07 ENCOUNTER — Telehealth: Payer: Self-pay

## 2023-10-07 ENCOUNTER — Other Ambulatory Visit: Payer: Self-pay | Admitting: Family

## 2023-10-07 DIAGNOSIS — F5101 Primary insomnia: Secondary | ICD-10-CM

## 2023-10-07 DIAGNOSIS — I69354 Hemiplegia and hemiparesis following cerebral infarction affecting left non-dominant side: Secondary | ICD-10-CM | POA: Diagnosis not present

## 2023-10-07 DIAGNOSIS — I2510 Atherosclerotic heart disease of native coronary artery without angina pectoris: Secondary | ICD-10-CM | POA: Diagnosis not present

## 2023-10-07 DIAGNOSIS — E079 Disorder of thyroid, unspecified: Secondary | ICD-10-CM | POA: Diagnosis not present

## 2023-10-07 DIAGNOSIS — I10 Essential (primary) hypertension: Secondary | ICD-10-CM | POA: Diagnosis not present

## 2023-10-07 DIAGNOSIS — E0590 Thyrotoxicosis, unspecified without thyrotoxic crisis or storm: Secondary | ICD-10-CM

## 2023-10-07 DIAGNOSIS — E119 Type 2 diabetes mellitus without complications: Secondary | ICD-10-CM | POA: Diagnosis not present

## 2023-10-07 MED ORDER — ZOLPIDEM TARTRATE ER 12.5 MG PO TBCR: 12.5000 mg | EXTENDED_RELEASE_TABLET | Freq: Every evening | ORAL | 0 refills | Status: DC | PRN

## 2023-10-07 NOTE — Telephone Encounter (Signed)
 Copied from CRM #[id number]. Topic: Clinical - Prescription Issue >> Oct 07, 2023  1:05 PM Mercer PEDLAR wrote: Reason for CRM: Alyah calling from [facility] pharmacy to request insulin  lispro 100 unit/mL insulin  pen to be sent as two vials instead of one.   [facility] [place]  - [place], [place] - [address] [place] [address] Phone: [phone] Fax: [phone]  Spoke with Rollo from [facility] pharmacy to let her that the patient does not use this pharmacy and she was stating that the patient needs to notified them by giving them a call. I have call the patient and she will call them,

## 2023-10-07 NOTE — Telephone Encounter (Signed)
 Copied from CRM [id number]. Topic: Clinical - Order For Equipment >> Oct 07, 2023  3:59 PM Suzette B wrote: Reason for CRM: Patient is called to check status of a hospital bed that was supposed to be ordered by provider, please call [phone] Ms. Becton to advise her on the bed and incontinence supplies via [facility]

## 2023-10-07 NOTE — Telephone Encounter (Signed)
 Copied from CRM [id number]. Topic: Clinical - Order For Equipment >> Oct 07, 2023 12:10 PM Carmell R wrote: Reason for CRM: Pt checking the status of the DME order that Dr. Phyllis was to place for a hospital bed, wipes, grips, bed pads, and gloves to [facility]. Pt says she is running out of supplies. Cb# [phone].

## 2023-10-07 NOTE — Telephone Encounter (Signed)
 Patient is requesting a refill of the following medications: Requested Prescriptions   Pending Prescriptions Disp Refills   zolpidem  (AMBIEN  CR) 12.5 MG CR tablet 30 tablet 0    Sig: Take 1 tablet (12.5 mg total) by mouth at bedtime as needed for sleep.    Date of last refill: 09/09/23  Refill amount: 30  Treatment agreement date: 08/06/23

## 2023-10-07 NOTE — Telephone Encounter (Signed)
 Copied from CRM #[id number]. Topic: Clinical - Medical Advice >> Oct 07, 2023  4:21 PM Merlynn A wrote: Reason for CRM: Patient called in regarding hospital bed and incontinent supplies. I do not see an order placed for DME or incontinent supplies.Please contact patient for follow up regarding request. Patient can be reached at [phone].

## 2023-10-07 NOTE — Telephone Encounter (Unsigned)
 Copied from CRM [id number]. Topic: Clinical - Medication Refill >> Oct 07, 2023 11:50 AM Carmell R wrote: Medication: zolpidem  (AMBIEN  CR) 12.5 MG CR tablet  Has the patient contacted their pharmacy? Yes. Needs new rx. Was not filled at this mail order previously.  This is the patient's preferred pharmacy:  [facility] [place], [place] - [address] [place] [address] Phone: [phone] Fax: [phone]  Is this the correct pharmacy for this prescription? Yes  Has the prescription been filled recently? Yes  Is the patient out of the medication? Yes  Has the patient been seen for an appointment in the last year OR does the patient have an upcoming appointment? Yes  Can we respond through MyChart? No  Agent: Please be advised that Rx refills may take up to 3 business days. We ask that you follow-up with your pharmacy.

## 2023-10-09 DIAGNOSIS — E119 Type 2 diabetes mellitus without complications: Secondary | ICD-10-CM | POA: Diagnosis not present

## 2023-10-09 DIAGNOSIS — I69354 Hemiplegia and hemiparesis following cerebral infarction affecting left non-dominant side: Secondary | ICD-10-CM | POA: Diagnosis not present

## 2023-10-09 DIAGNOSIS — I10 Essential (primary) hypertension: Secondary | ICD-10-CM | POA: Diagnosis not present

## 2023-10-09 DIAGNOSIS — I2510 Atherosclerotic heart disease of native coronary artery without angina pectoris: Secondary | ICD-10-CM | POA: Diagnosis not present

## 2023-10-09 DIAGNOSIS — E079 Disorder of thyroid, unspecified: Secondary | ICD-10-CM | POA: Diagnosis not present

## 2023-10-10 ENCOUNTER — Encounter: Payer: Self-pay | Admitting: Physical Medicine and Rehabilitation

## 2023-10-10 ENCOUNTER — Encounter: Attending: Physical Medicine & Rehabilitation | Admitting: Physical Medicine and Rehabilitation

## 2023-10-10 ENCOUNTER — Other Ambulatory Visit: Payer: Self-pay | Admitting: Family

## 2023-10-10 VITALS — BP 164/116 | HR 101 | Ht 62.0 in | Wt 177.8 lb

## 2023-10-10 DIAGNOSIS — I6930 Unspecified sequelae of cerebral infarction: Secondary | ICD-10-CM | POA: Insufficient documentation

## 2023-10-10 DIAGNOSIS — Z91199 Patient's noncompliance with other medical treatment and regimen due to unspecified reason: Secondary | ICD-10-CM | POA: Diagnosis not present

## 2023-10-10 DIAGNOSIS — G8114 Spastic hemiplegia affecting left nondominant side: Secondary | ICD-10-CM | POA: Insufficient documentation

## 2023-10-10 DIAGNOSIS — I10 Essential (primary) hypertension: Secondary | ICD-10-CM | POA: Insufficient documentation

## 2023-10-10 DIAGNOSIS — I639 Cerebral infarction, unspecified: Secondary | ICD-10-CM | POA: Insufficient documentation

## 2023-10-10 DIAGNOSIS — Z993 Dependence on wheelchair: Secondary | ICD-10-CM | POA: Diagnosis not present

## 2023-10-10 NOTE — Patient Instructions (Signed)
 Pt is a 53 yr old female with chronic spasticity and L hemiplegia from prior stroke- 10/22- latest one; but said she's has 22 strokes in past.  and Debility related to DKA and recent rehab admission- also has aphasia and poorly controlled HTN   Here for f/u on strokes  If cough medicine not working, call PCP back-   2.   Would take Over the counter laxative- like Senna- to keep her going while on baclofen .   3. We need to get back in ASAP with Dr Carilyn for Botox  of LUE- losing ROM of LUE.  Missed- not due to being in hospital-   4 Con't Baclofen  10 mg 3x/day- getting Rx from PCP.    5.  Will ask nurse to check BP prior to leaving- missed her BP meds.   6. F/U in 3 months to f/u- need to see where she is AFTER Botox  to see what else might be helpful.

## 2023-10-10 NOTE — Telephone Encounter (Signed)
 Tried to call patient in regards of the order of the supplies from [employer] [facility] and just waiting on a response from the fax.

## 2023-10-10 NOTE — Telephone Encounter (Signed)
 Copied from CRM [id number]. Topic: Clinical - Order For Equipment >> Oct 07, 2023 12:10 PM Carmell R wrote: Reason for CRM: Pt checking the status of the DME order that Dr. Phyllis was to place for a hospital bed, wipes, grips, bed pads, and gloves to [facility]. Pt says she is running out of supplies. Cb# [phone].

## 2023-10-10 NOTE — Progress Notes (Signed)
 Subjective:    Patient ID: Lori [name], female    DOB: [birth year], 53 y.o.   MRN: [id number]  HPI  Pt is a 53 yr old female with chronic spasticity and L hemiplegia from prior stroke- 10/22- latest one; but said she's has 22 strokes in past.  and Debility related to DKA and recent rehab admission- also has aphasia and poorly controlled HTN   Here for f/u on strokes   BP 164/116- this AM- will recheck it- hasn't taken BP meds this AM.   Last Botox  06/13/23 by Dr Carilyn LEFT Biceps50 2+ Brachialis 75 1+ FCR50 2+ FCU0 FDS50 2+ FDP50 1+ Opponens pollicis 25 2+ Lumbricals 75 0-1+ PT 25 2+    Been to hospital multiple times this year and missed appointments-   Doesn't remember me-   OK today-  Not really using LUE-   Walking with RW-  walks in apartment- to the front door- outside the apartment, uses w/c.   Doesn't have another appt with Dr Carilyn.  Was due in August for Dr Carilyn- 09/09/23 but missed that appt.   Restarted therapy- coming to the home- PT 1x/week- OT 2x/week-  going OK-  wishes PT would come to see her more.   L wrist xray 04/27/23  1. 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    Is congested- last month- not getting better- about the same- went to see PCP 8/27-  was given meds.    Still ON baclofen  10 mg TID- is taking as prescribed.   Is constipated.     Pain Inventory Average Pain 4 Pain Right Now 4 My pain is constant and dull  In the last 24 hours, has pain interfered with the following? General activity 4 Relation with others 4 Enjoyment of life 10 What TIME of day is your pain at its worst? morning , daytime, evening, and night Sleep (in general) Poor  Pain is worse with: walking, bending, sitting, standing, and some activites Pain improves with: nothing Relief from Meds: 0  Family History  Problem Relation Age of Onset   Hypertension  Mother    Diabetes Mother    Atrial fibrillation Mother    Hypertension Father    Heart attack Father    Dementia Father    Diabetes Brother    Brain cancer Brother    Asthma Daughter    Sickle cell anemia Son    Atrial fibrillation Maternal Uncle    Social History   Socioeconomic History   Marital status: Married    Spouse name: Not on file   Number of children: Not on file   Years of education: Not on file   Highest education level: Not on file  Occupational History   Not on file  Tobacco Use   Smoking status: Never   Smokeless tobacco: Never  Vaping Use   Vaping status: Never Used  Substance and Sexual Activity   Alcohol use: Not Currently   Drug use: Never   Sexual activity: Not on file  Other Topics Concern   Not on file  Social History Narrative   Tobacco use, amount per day now: Never   Past tobacco use, amount per day: Never   How many years did you use tobacco: Never   Alcohol use (drinks per week): Not Currently.   Diet: Eat out a lot.    Do you drink/eat things with caffeine : Sweet Tea   Marital status:   Married  What year were you married? 2000   Do you live in a house, apartment, assisted living, condo, trailer, etc.? House   Is it one or more stories? 1   How many persons live in your home? 2   Do you have pets in your home?( please list) No   Highest Level of education completed? Bachelors Degree.   Current or past profession: Solicitor, [employer]   Do you exercise?   No                               Type and how often?   Do you have a living will? No   Do you have a DNR form?       No                            If not, do you want to discuss one?   Do you have signed POA/HPOA forms?   No                     If so, please bring to you appointment      Do you have any difficulty bathing or dressing yourself? Yes   Do you have any difficulty preparing food or eating? Yes    Do you have any difficulty managing your medications? Yes   Do you have any difficulty managing your finances? No   Do you have any difficulty affording your medications? Yes   Social Drivers of Corporate investment banker Strain: Not on file  Food Insecurity: No Food Insecurity (06/03/2023)   Hunger Vital Sign    Worried About Running Out of Food in the Last Year: Never true    Ran Out of Food in the Last Year: Never true  Transportation Needs: No Transportation Needs (06/03/2023)   PRAPARE - Administrator, Civil Service (Medical): No    Lack of Transportation (Non-Medical): No  Physical Activity: Not on file  Stress: Not on file  Social Connections: Not on file   Past Surgical History:  Procedure Laterality Date   ABDOMINAL HYSTERECTOMY     BIOPSY  02/06/2023   Procedure: BIOPSY;  Surgeon: Leigh Elspeth SQUIBB, MD;  Location: [facility];  Service: Gastroenterology;;   CESAREAN SECTION     3   CORONARY ARTERY BYPASS GRAFT     ESOPHAGOGASTRODUODENOSCOPY (EGD) WITH PROPOFOL  N/A 02/06/2023   Procedure: ESOPHAGOGASTRODUODENOSCOPY (EGD) WITH PROPOFOL ;  Surgeon: Leigh Elspeth SQUIBB, MD;  Location: [facility];  Service: Gastroenterology;  Laterality: N/A;   Right adrenal gland removal for pheochromocytoma Right    Past Surgical History:  Procedure Laterality Date   ABDOMINAL HYSTERECTOMY     BIOPSY  02/06/2023   Procedure: BIOPSY;  Surgeon: Leigh Elspeth SQUIBB, MD;  Location: [facility];  Service: Gastroenterology;;   CESAREAN SECTION     3   CORONARY ARTERY BYPASS GRAFT     ESOPHAGOGASTRODUODENOSCOPY (EGD) WITH PROPOFOL  N/A 02/06/2023   Procedure: ESOPHAGOGASTRODUODENOSCOPY (EGD) WITH PROPOFOL ;  Surgeon: Leigh Elspeth SQUIBB, MD;  Location: [facility];  Service: Gastroenterology;  Laterality: N/A;   Right adrenal gland removal for pheochromocytoma Right    Past Medical History:  Diagnosis Date   Acute bronchitis 08/24/2021   Acute cystitis 12/26/2022   Acute  metabolic encephalopathy 11/04/2022   Acute renal failure (ARF) (HCC) 04/27/2023   AKI (  acute kidney injury) (HCC) 04/30/2022   Aspiration pneumonia (HCC) 10/13/2022   CAD (coronary artery disease)    Cerebral infarction, unspecified (HCC) 09/27/2012   Chest pain of uncertain etiology 10/06/2022   Depression    Diabetes mellitus without complication (HCC)    Diabetic hyperosmolar non-ketotic state (HCC) 01/19/2023   DKA (diabetic ketoacidosis) (HCC) 02/19/2021   History of CT scan    History of mammogram    History of MRI    Hypertension    Pheochromocytoma    Reversible cerebrovascular vasoconstriction syndrome    Spell of abnormal behavior    Stroke (HCC)    Thyroid  disease    BP (!) 164/116   Pulse (!) 101   Ht 5' 2 (1.575 m)   Wt 177 lb 12.8 oz (80.6 kg) Comment: last recorded  SpO2 96%   BMI 32.52 kg/m   Opioid Risk Score:   Fall Risk Score:  `1  Depression screen [facility] 2/9     10/10/2023    9:32 AM 07/29/2023   10:40 AM 06/13/2023   10:07 AM 05/15/2023   10:48 AM 04/03/2023    2:19 PM 12/24/2022   10:29 AM 11/01/2022    3:05 PM  Depression screen PHQ 2/9  Decreased Interest 0 0 0 0 1 1 0  Down, Depressed, Hopeless 0 0 0 0 0 1 0  PHQ - 2 Score 0 0 0 0 1 2 0  Altered sleeping      1   Tired, decreased energy      0   Change in appetite      0   Feeling bad or failure about yourself       0   Trouble concentrating      0   Moving slowly or fidgety/restless      0   Suicidal thoughts      0   PHQ-9 Score      3   Difficult doing work/chores      Not difficult at all       Review of Systems  Musculoskeletal:  Positive for gait problem.       Left wrist pain       Objective:   Physical Exam  Awake, alert, appropriate, in manual wc; accompanied by husband?, NAD LUE- drawn up- past due for Botox  MAS of 4 in Shoulder esp external rotation; elbow flexion/extension; and Wrist flexion/extension- as well as 3-4 finger extension L thumb is curled in - cannot get  into neutral- Fingers curled at MCPs- but straight at PIPs/DIPs except 5th digit- curled/flexed at PIP/DIP Can only get to -45 of full extension on fingers And lacking ~ 45 degrees of L elbow extension And L wrist in extension at rest- cannot get into  neutral- lacking 15-20 degrees of neutral.  L ankle lacking ROM of L ankle not clear if pt fighting me or is tone- since very little tone in hip and L knee- but in ~ 75 degrees of DF- and hard to move into DF or PF  MSK: RUE/RLE- 5/5 LUE- Can do ~ 3/5 in L biceps and triceps- and 2-/5 grip- but cannot test otherwise due to spasticity.  LLE- 4/5 throughout         Assessment & Plan:   Pt is a 53 yr old female with chronic spasticity and L hemiplegia from prior stroke- 10/22- latest one; but said she's has 22 strokes in past.  and Debility related to DKA and recent rehab admission-  also has aphasia and poorly controlled HTN   Here for f/u on strokes  If cough medicine not working, call PCP back-   2.   Would take Over the counter laxative- like Senna- to keep her going while on baclofen .   3. We need to get back in ASAP with Dr Carilyn for Botox  of LUE- losing ROM of LUE.  Missed- not due to being in hospital- but just missed appt-is developing increasing tone and contractures, so really meets criteria for continued Botox  of LUE  4 Con't Baclofen  10 mg 3x/day- getting Rx from PCP.    5.  Will ask nurse to check BP prior to leaving- missed her BP meds.   6. F/U in 3 months to f/u- need to see where she is AFTER Botox  to see what else might be helpful.    I spent a total of  25  minutes on total care today- >50% coordination of care- due to d/w pt about Botox - and missing appt- vs spasticity increasing- to prevent ocntracture, needs Botox 

## 2023-10-11 DIAGNOSIS — I2510 Atherosclerotic heart disease of native coronary artery without angina pectoris: Secondary | ICD-10-CM | POA: Diagnosis not present

## 2023-10-11 DIAGNOSIS — I10 Essential (primary) hypertension: Secondary | ICD-10-CM | POA: Diagnosis not present

## 2023-10-11 DIAGNOSIS — I69354 Hemiplegia and hemiparesis following cerebral infarction affecting left non-dominant side: Secondary | ICD-10-CM | POA: Diagnosis not present

## 2023-10-11 DIAGNOSIS — E079 Disorder of thyroid, unspecified: Secondary | ICD-10-CM | POA: Diagnosis not present

## 2023-10-11 DIAGNOSIS — E119 Type 2 diabetes mellitus without complications: Secondary | ICD-10-CM | POA: Diagnosis not present

## 2023-10-13 ENCOUNTER — Other Ambulatory Visit: Payer: Self-pay

## 2023-10-14 DIAGNOSIS — E079 Disorder of thyroid, unspecified: Secondary | ICD-10-CM | POA: Diagnosis not present

## 2023-10-14 DIAGNOSIS — E119 Type 2 diabetes mellitus without complications: Secondary | ICD-10-CM | POA: Diagnosis not present

## 2023-10-14 DIAGNOSIS — I10 Essential (primary) hypertension: Secondary | ICD-10-CM | POA: Diagnosis not present

## 2023-10-14 DIAGNOSIS — I69354 Hemiplegia and hemiparesis following cerebral infarction affecting left non-dominant side: Secondary | ICD-10-CM | POA: Diagnosis not present

## 2023-10-14 DIAGNOSIS — I2510 Atherosclerotic heart disease of native coronary artery without angina pectoris: Secondary | ICD-10-CM | POA: Diagnosis not present

## 2023-10-16 ENCOUNTER — Telehealth: Payer: Self-pay

## 2023-10-16 DIAGNOSIS — E119 Type 2 diabetes mellitus without complications: Secondary | ICD-10-CM | POA: Diagnosis not present

## 2023-10-16 DIAGNOSIS — E079 Disorder of thyroid, unspecified: Secondary | ICD-10-CM | POA: Diagnosis not present

## 2023-10-16 DIAGNOSIS — I69354 Hemiplegia and hemiparesis following cerebral infarction affecting left non-dominant side: Secondary | ICD-10-CM | POA: Diagnosis not present

## 2023-10-16 DIAGNOSIS — I10 Essential (primary) hypertension: Secondary | ICD-10-CM | POA: Diagnosis not present

## 2023-10-16 DIAGNOSIS — I2510 Atherosclerotic heart disease of native coronary artery without angina pectoris: Secondary | ICD-10-CM | POA: Diagnosis not present

## 2023-10-16 NOTE — Telephone Encounter (Signed)
 Dr. Cornelio & Dr. Carilyn are currently out of the office.   Marget the Physical Therapist with [facility] called to report:   Lori [name] was groggy sleep and hard to arise. Marget thinks it maybe due to her medications.  Marget has been advised to contact Lori [name] PCP. If there is still an issue call EMS for assessment.

## 2023-10-20 ENCOUNTER — Telehealth: Payer: Self-pay

## 2023-10-20 DIAGNOSIS — E1169 Type 2 diabetes mellitus with other specified complication: Secondary | ICD-10-CM

## 2023-10-20 DIAGNOSIS — I10 Essential (primary) hypertension: Secondary | ICD-10-CM

## 2023-10-20 MED ORDER — OLMESARTAN MEDOXOMIL-HCTZ 40-25 MG PO TABS: 1.0000 | ORAL_TABLET | Freq: Every day | ORAL | 1 refills | Status: DC

## 2023-10-20 MED ORDER — METOPROLOL TARTRATE 50 MG PO TABS
50.0000 mg | ORAL_TABLET | Freq: Two times a day (BID) | ORAL | 1 refills | Status: DC
Start: 2023-10-20 — End: 2023-12-30

## 2023-10-20 NOTE — Telephone Encounter (Signed)
 History of CVA was #6 in the problem list.

## 2023-10-20 NOTE — Telephone Encounter (Signed)
 Per previous medication list supposed to be taking medication.

## 2023-10-20 NOTE — Telephone Encounter (Signed)
 Copied from CRM [id number]. Topic: Clinical - Medication Question >> Oct 20, 2023 12:14 PM Merlynn A wrote: Reason for CRM: Patient called in regarding medication refill for BP medication. Patient unsure which medication is for BP. Please contact patient to assist with advising of the medication and requesting a refill. Patient can be reached at [phone].

## 2023-10-20 NOTE — Telephone Encounter (Signed)
 Outgoing call placed to patient, it appeared that someone answered phone, however after no one saying anything after several hello's call ended. I will try to reach patient again later.

## 2023-10-20 NOTE — Telephone Encounter (Signed)
 I returned call to [facility] and left a detailed message informing her that the DOB on the order and office note is what is in our system [birth year] and I was not sure where she is seeing a different DOB of [birth year], however I did see that the DX on the order  (History of CVA) was not listed in the visit notes from 10/01/23 and I will send message to Monina to further address this discrepancy.

## 2023-10-20 NOTE — Telephone Encounter (Signed)
 I returned call to [facility] again and left a detailed message informing her on Monina's response

## 2023-10-20 NOTE — Telephone Encounter (Signed)
 Copied from CRM [id number]. Topic: Clinical - Order For Equipment >> Oct 20, 2023 10:17 AM Cherylann RAMAN wrote: Reason for CRM: Brittny with [employer] [facility] called to have an order corrected for patient's DME for a hospital bed corrected. The following needs adjusted for insurance purposes: DOB, paperwork that was sent over has DOB as 10/12/1943, The Dx; provider suggested in the notes that patient has issues with aspiration and CHS, however, conflicting dx on the order versus the notes. Please contact Brittny for additional information at [phone], may leave detailed message. If unable to reach Brittny please call [phone] to reach someone in the office regarding parachute order.

## 2023-10-20 NOTE — Telephone Encounter (Signed)
 Spoke with patient and advised that according to her medication list she takes metoprolol  and benicar  for high blood pressure. Patient states she does not have either of these medications and has no idea when she last took them. Patient states that her husband helps manage her medications and when I asked to speak to him, she called his name 5 times and he never came to the phone. Patient is open to connecting with someone to help manage medications.   Dinah please confirm patient is to take the 2 medications listed above

## 2023-10-21 DIAGNOSIS — E119 Type 2 diabetes mellitus without complications: Secondary | ICD-10-CM | POA: Diagnosis not present

## 2023-10-21 DIAGNOSIS — I69354 Hemiplegia and hemiparesis following cerebral infarction affecting left non-dominant side: Secondary | ICD-10-CM | POA: Diagnosis not present

## 2023-10-21 DIAGNOSIS — I2510 Atherosclerotic heart disease of native coronary artery without angina pectoris: Secondary | ICD-10-CM | POA: Diagnosis not present

## 2023-10-21 DIAGNOSIS — E079 Disorder of thyroid, unspecified: Secondary | ICD-10-CM | POA: Diagnosis not present

## 2023-10-21 DIAGNOSIS — I10 Essential (primary) hypertension: Secondary | ICD-10-CM | POA: Diagnosis not present

## 2023-10-22 ENCOUNTER — Telehealth: Payer: Self-pay | Admitting: Pharmacist

## 2023-10-22 NOTE — Telephone Encounter (Signed)
 I have E-faxed over DME Order/Supplies

## 2023-10-22 NOTE — Telephone Encounter (Unsigned)
 Copied from CRM [id number]. Topic: Clinical - Order For Equipment >> Oct 22, 2023 10:15 AM Farrel B wrote: Reason for CRM: Patient and assistant call to advise that [employer] [facility] informed them they have not heard from the office in regards to the incontinence supplies or the bed that was ordered. I was provided with the reference number #[id number] and Fax number to fax over the order to [employer] is [phone]

## 2023-10-22 NOTE — Progress Notes (Signed)
   10/22/2023  Patient ID: Lori [name], female   DOB: Jul 28, 1970, 53 y.o.   MRN: [id number]   Patient was referred for medication adherence and diabetes management. Unfortunately, she did not answer her phone. HIPAA compliant message was left on her voicemail.  Plan: Call Patient back in 1-2 weeks.  Cassius DOROTHA Brought, PharmD, BCACP Clinical Pharmacist [phone]

## 2023-10-22 NOTE — Telephone Encounter (Signed)
 See previous telephone encounter.

## 2023-10-22 NOTE — Telephone Encounter (Signed)
 Copied from CRM [id number]. Topic: Clinical - Order For Equipment >> Oct 22, 2023  9:47 AM Carrielelia G wrote: Attn: Suellen Devin BROCKS, CMA  Please call patient [place] on the status of hospital bed  Thank you

## 2023-10-23 ENCOUNTER — Ambulatory Visit: Payer: Self-pay

## 2023-10-23 DIAGNOSIS — I2510 Atherosclerotic heart disease of native coronary artery without angina pectoris: Secondary | ICD-10-CM | POA: Diagnosis not present

## 2023-10-23 DIAGNOSIS — I10 Essential (primary) hypertension: Secondary | ICD-10-CM | POA: Diagnosis not present

## 2023-10-23 DIAGNOSIS — E079 Disorder of thyroid, unspecified: Secondary | ICD-10-CM | POA: Diagnosis not present

## 2023-10-23 DIAGNOSIS — I69354 Hemiplegia and hemiparesis following cerebral infarction affecting left non-dominant side: Secondary | ICD-10-CM | POA: Diagnosis not present

## 2023-10-23 DIAGNOSIS — E119 Type 2 diabetes mellitus without complications: Secondary | ICD-10-CM | POA: Diagnosis not present

## 2023-10-23 NOTE — Telephone Encounter (Signed)
 [name] or Action Required?: [name] for provider.  Patient was last seen in primary care on 10/01/2023 by Medina-Vargas, Jereld BROCKS, NP.  Called Nurse Triage reporting Fatigue, cough, constipation, and rectal pain.  Symptoms began a week ago.  Interventions attempted: Rest, hydration, or home remedies.  Symptoms are: generalized fatigue/drowsiness, productive cough with clear mucus, constipation x 3 days, rectal pain gradually worsening.  Triage Disposition: See Physician Within 24 Hours (overriding See HCP Within 4 Hours (Or PCP Triage))  Patient/caregiver understands and will follow disposition?: Yes              Copied from CRM [id number]. Topic: Clinical - Medication Question >> Oct 23, 2023  9:54 AM Laurier BROCKS wrote: Reason for CRM: OT therapist Bjorn [phone] wants a call back to confirm if the following medication: methimazole  (TAPAZOLE ) 5 MG tablet would cause drowsiness? Reason for Disposition  [1] MODERATE weakness (e.g., interferes with work, school, normal activities) AND [2] cause unknown  (Exceptions: Weakness from acute minor illness or poor fluid intake; weakness is chronic and not worse.)  Answer Assessment - Initial Assessment Questions 1. DESCRIPTION: Describe how you are feeling.     Drowsy, laying in bed.  2. SEVERITY: How bad is it?  Can you stand and walk?     She states her baseline would be to get up and out of bed, get in her rocking chair. She states she can't walk.  3. ONSET: When did these symptoms begin? (e.g., hours, days, weeks, months)     X 1 week.  4. CAUSE: What do you think is causing the weakness or fatigue? (e.g., not drinking enough fluids, medical problem, trouble sleeping)     OT called asking if it was her Tapazole  causing the symptoms.  5. NEW MEDICINES:  Have you started on any new medicines recently? (e.g., opioid pain medicines, benzodiazepines, muscle relaxants, antidepressants, antihistamines, neuroleptics,  beta blockers)     No.  6. OTHER SYMPTOMS: Do you have any other symptoms? (e.g., chest pain, fever, cough, SOB, vomiting, diarrhea, bleeding, other areas of pain)     Cough (productive with clear mucus), constipation (3 days ago last BM), rectal pain 8/10. Denies rectal bleeding, bleeding, fever, nausea, vomiting, chest pain, SOB, syncopal episodes, urinary frequency, burning with urination, any other areas of pain. Patient reports she has been eating and drinking well.  7. PREGNANCY: Is there any chance you are pregnant? When was your last menstrual period?     N/A.  Protocols used: Weakness (Generalized) and Fatigue-A-AH

## 2023-10-24 ENCOUNTER — Ambulatory Visit: Admitting: Family

## 2023-10-24 ENCOUNTER — Telehealth: Payer: Self-pay | Admitting: *Deleted

## 2023-10-24 NOTE — Telephone Encounter (Signed)
 Copied from CRM [id number]. Topic: Appointments - Scheduling Inquiry for Clinic >> Oct 24, 2023  8:46 AM Alfonso ORN wrote: Reason for CRM: pt. need an acute visit appt. she had an appt. today 10/24/23 at 11:00am  and had to cancel the appt.  fatigue/drowsiness, states staying in bed mostly. cough, constipation with rectal pain. triage RN

## 2023-10-24 NOTE — Telephone Encounter (Signed)
Tried calling patient, LMOM to return call.

## 2023-10-27 ENCOUNTER — Ambulatory Visit (INDEPENDENT_AMBULATORY_CARE_PROVIDER_SITE_OTHER): Admitting: Family

## 2023-10-27 ENCOUNTER — Encounter: Payer: Self-pay | Admitting: Family

## 2023-10-27 VITALS — BP 140/88 | HR 96 | Temp 97.7°F | Resp 18 | Ht 62.0 in | Wt 163.4 lb

## 2023-10-27 DIAGNOSIS — G43019 Migraine without aura, intractable, without status migrainosus: Secondary | ICD-10-CM | POA: Diagnosis not present

## 2023-10-27 DIAGNOSIS — I10 Essential (primary) hypertension: Secondary | ICD-10-CM | POA: Diagnosis not present

## 2023-10-27 DIAGNOSIS — I639 Cerebral infarction, unspecified: Secondary | ICD-10-CM | POA: Diagnosis not present

## 2023-10-27 DIAGNOSIS — R2681 Unsteadiness on feet: Secondary | ICD-10-CM | POA: Diagnosis not present

## 2023-10-27 DIAGNOSIS — F32A Depression, unspecified: Secondary | ICD-10-CM

## 2023-10-27 DIAGNOSIS — F5101 Primary insomnia: Secondary | ICD-10-CM | POA: Diagnosis not present

## 2023-10-27 DIAGNOSIS — E0590 Thyrotoxicosis, unspecified without thyrotoxic crisis or storm: Secondary | ICD-10-CM

## 2023-10-27 DIAGNOSIS — R112 Nausea with vomiting, unspecified: Secondary | ICD-10-CM | POA: Diagnosis not present

## 2023-10-27 DIAGNOSIS — Z794 Long term (current) use of insulin: Secondary | ICD-10-CM | POA: Diagnosis not present

## 2023-10-27 DIAGNOSIS — R300 Dysuria: Secondary | ICD-10-CM

## 2023-10-27 DIAGNOSIS — F419 Anxiety disorder, unspecified: Secondary | ICD-10-CM

## 2023-10-27 DIAGNOSIS — K5901 Slow transit constipation: Secondary | ICD-10-CM

## 2023-10-27 DIAGNOSIS — J4520 Mild intermittent asthma, uncomplicated: Secondary | ICD-10-CM | POA: Diagnosis not present

## 2023-10-27 DIAGNOSIS — E1169 Type 2 diabetes mellitus with other specified complication: Secondary | ICD-10-CM | POA: Diagnosis not present

## 2023-10-27 DIAGNOSIS — B370 Candidal stomatitis: Secondary | ICD-10-CM | POA: Diagnosis not present

## 2023-10-27 DIAGNOSIS — G8114 Spastic hemiplegia affecting left nondominant side: Secondary | ICD-10-CM

## 2023-10-27 MED ORDER — NYSTATIN 100000 UNIT/ML MT SUSP
5.0000 mL | Freq: Four times a day (QID) | OROMUCOSAL | 0 refills | Status: AC
Start: 2023-10-27 — End: ?

## 2023-10-27 MED ORDER — POLYETHYLENE GLYCOL 3350 17 GM/SCOOP PO POWD: 17.0000 g | Freq: Every day | ORAL | 1 refills | Status: AC

## 2023-10-27 MED ORDER — GABAPENTIN 100 MG PO CAPS: 100.0000 mg | ORAL_CAPSULE | Freq: Three times a day (TID) | ORAL | 1 refills | Status: DC

## 2023-10-27 MED ORDER — GUAIFENESIN ER 600 MG PO TB12: 600.0000 mg | ORAL_TABLET | Freq: Two times a day (BID) | ORAL | 0 refills | Status: AC

## 2023-10-27 MED ORDER — METOCLOPRAMIDE HCL 10 MG PO TABS: 10.0000 mg | ORAL_TABLET | Freq: Four times a day (QID) | ORAL | 3 refills | Status: DC

## 2023-10-27 MED ORDER — ZOLPIDEM TARTRATE ER 12.5 MG PO TBCR: 12.5000 mg | EXTENDED_RELEASE_TABLET | Freq: Every evening | ORAL | 3 refills | Status: DC | PRN

## 2023-10-27 MED ORDER — SENNOSIDES-DOCUSATE SODIUM 8.6-50 MG PO TABS: 2.0000 | ORAL_TABLET | Freq: Two times a day (BID) | ORAL | 1 refills | Status: DC

## 2023-10-27 NOTE — Progress Notes (Signed)
 Provider: Roxan Plough FNP-C   Kayonna Lawniczak, [name], NP  Patient Care Team: Poonam Woehrle C, NP as PCP - General (Family Medicine) Thukkani, Arun K, MD as PCP - Cardiology (Cardiology) [facility] Thapa, [place], MD as Consulting Physician (Endocrinology)  Extended Emergency Contact Information Primary Emergency Contact: [name],[name] Mobile Phone: [phone] Relation: Spouse Preferred language: English Interpreter needed? No Secondary Emergency Contact: [name],[name] Home Phone: [phone] Mobile Phone: [phone] Relation: Daughter Mother: [facility] Phone: [phone] Mobile Phone: [phone]  Code Status:  Full Code  Goals of care: Advanced Directive information    10/27/2023    1:21 PM  Advanced Directives  Does Patient Have a Medical Advance Directive? No  Would patient like information on creating a medical advance directive? No - Patient declined     Chief Complaint  Patient presents with   Medical Management of Chronic Issues    6 Month follow up.      Discussed the use of AI scribe software for clinical note transcription with the patient, who gave verbal consent to proceed.  History of Present Illness   Lori [name] is a 53 year old female with diabetes and hypertension who presents for a six-month follow-up appointment. She is accompanied by spouse who is her caregiver.  She uses a walker for mobility around the house and can walk significant distances with it. Assistance is required for getting out of bed due to a contracted left hand. No falls have occurred since the last visit.  Her blood pressure at home has been stable. She has lost weight since her last visit, now weighing 163.4 pounds, down from 179 pounds.  Diabetes management is challenging, with an A1c at 10.5. Blood sugar levels fluctuate, particularly spiking to 300 when consuming sweets. She sometimes skips meals, leading to low  blood sugar levels. She uses a [employer] for monitoring and takes Humalog  and Lantus  for insulin  management.  She experiences chronic constipation, requiring Miralax  and Senokot, though they are only partially effective. She suspects one of her medications may contribute to her constipation.  She has a history of stroke affecting her left side. A Pure Wick catheter is used regularly and changed daily. Nystatin  has not been used as previously prescribed.  Current medications include baclofen , Flexeril , Protonix , Topamax , Reglan , Ranexa , atorvastatin , methimazole , Xarelto , Lexapro , Carafate , and albuterol . Gabapentin  was discontinued due to running out, but there is a need for it for nerve pain.  She has a chronic cough but no fever or chills. No shortness of breath, or wheezing. She just got new glasses and likes them.  She primarily consumes food from outside sources and wants to cook at home, though she faces challenges in grocery shopping due to mobility issues.    Past Medical History:  Diagnosis Date   Acute bronchitis 08/24/2021   Acute cystitis 12/26/2022   Acute metabolic encephalopathy 11/04/2022   Acute renal failure (ARF) 04/27/2023   AKI (acute kidney injury) (HCC) 04/30/2022   Aspiration pneumonia (HCC) 10/13/2022   CAD (coronary artery disease)    Cerebral infarction, unspecified (HCC) 09/27/2012   Chest pain of uncertain etiology 10/06/2022   Depression    Diabetes mellitus without complication (HCC)    Diabetic hyperosmolar non-ketotic state (HCC) 01/19/2023   DKA (diabetic ketoacidosis) (HCC) 02/19/2021   History of CT scan    History of mammogram    History of MRI    Hypertension    Pheochromocytoma    Reversible cerebrovascular vasoconstriction syndrome    Spell  of abnormal behavior    Stroke [facility])    Thyroid  disease    Past Surgical History:  Procedure Laterality Date   ABDOMINAL HYSTERECTOMY     BIOPSY  02/06/2023   Procedure: BIOPSY;  Surgeon:  Leigh Elspeth SQUIBB, MD;  Location: [facility] ENDOSCOPY;  Service: Gastroenterology;;   CESAREAN SECTION     3   CORONARY ARTERY BYPASS GRAFT     ESOPHAGOGASTRODUODENOSCOPY (EGD) WITH PROPOFOL  N/A 02/06/2023   Procedure: ESOPHAGOGASTRODUODENOSCOPY (EGD) WITH PROPOFOL ;  Surgeon: Leigh Elspeth SQUIBB, MD;  Location: [facility] ENDOSCOPY;  Service: Gastroenterology;  Laterality: N/A;   Right adrenal gland removal for pheochromocytoma Right     Allergies  Allergen Reactions   Asa [Aspirin ] Anaphylaxis and Swelling   Contrast Media [Iodinated Contrast Media] Anaphylaxis and Swelling   Imitrex [Sumatriptan] Anaphylaxis   Ms Contin  [Morphine ] Anaphylaxis and Swelling   Penicillins Swelling and Other (See Comments)    Mouth and eyes swell   Chlorhexidine  Gluconate [Chlorhexidine ] Itching and Rash   Firvanq  [Vancomycin ] Other (See Comments)    Red Man's Syndrome   Cipro  [Ciprofloxacin  Hcl] Nausea And Vomiting   Nitrofuran Derivatives Anxiety   Wound Dressing Adhesive Itching and Rash    Allergies as of 10/27/2023       Reactions   Asa [aspirin ] Anaphylaxis, Swelling   Contrast Media [iodinated Contrast Media] Anaphylaxis, Swelling   Imitrex [sumatriptan] Anaphylaxis   Ms Contin  [morphine ] Anaphylaxis, Swelling   Penicillins Swelling, Other (See Comments)   Mouth and eyes swell   Chlorhexidine  Gluconate [chlorhexidine ] Itching, Rash   Firvanq  [vancomycin ] Other (See Comments)   Red Man's Syndrome   Cipro  [ciprofloxacin  Hcl] Nausea And Vomiting   Nitrofuran Derivatives Anxiety   Wound Dressing Adhesive Itching, Rash        Medication List        Accurate as of October 27, 2023  3:16 PM. If you have any questions, ask your nurse or doctor.          STOP taking these medications    benzonatate  100 MG capsule Commonly known as: Tessalon  Perles Stopped by: Dennies Coate C Maryem Shuffler       TAKE these medications    acetaminophen  325 MG tablet Commonly known as: TYLENOL  Take 2 tablets (650 mg  total) by mouth every 6 (six) hours as needed for mild pain (pain score 1-3) (or Fever >/= 101).   albuterol  108 (90 Base) MCG/ACT inhaler Commonly known as: VENTOLIN  HFA INHALE 2 PUFFS BY MOUTH EVERY 6 HOURS AS NEEDED FOR WHEEZING AND SHORTNESS OF BREATH   atorvastatin  80 MG tablet Commonly known as: LIPITOR  Take 1 tablet (80 mg total) by mouth every evening.   baclofen  10 MG tablet Commonly known as: LIORESAL  Take 1 tablet (10 mg total) by mouth 3 (three) times daily.   clopidogrel  75 MG tablet Commonly known as: PLAVIX  TAKE 1 TABLET BY MOUTH DAILY   cyclobenzaprine  5 MG tablet Commonly known as: FLEXERIL  Take 1 tablet (5 mg total) by mouth 3 (three) times daily as needed for muscle spasms.   escitalopram  20 MG tablet Commonly known as: LEXAPRO  TAKE 1 TABLET BY MOUTH EVERY  MORNING   ezetimibe  10 MG tablet Commonly known as: ZETIA  TAKE 1 TABLET BY MOUTH DAILY   FreeStyle Libre 3 Plus Sensor Misc 1 each by Does not apply route continuous. Change every 15 days.   FreeStyle Libre 14 Day Sensor Misc 1 each by Does not apply route as needed. DX: E11.69   gabapentin  100 MG capsule  Commonly known as: NEURONTIN  Take 1 capsule (100 mg total) by mouth 3 (three) times daily.   guaiFENesin  600 MG 12 hr tablet Commonly known as: Mucinex  Take 1 tablet (600 mg total) by mouth 2 (two) times daily for 14 days. Started by: Ledger Heindl C Undine Nealis   HumaLOG  KwikPen 100 UNIT/ML KwikPen Generic drug: insulin  lispro 10-15 units with meals 3 times a day, maximum 45 units/day.   Insulin  Pen Needle 32G X 4 MM Misc Use 4x a day   Lantus  SoloStar 100 UNIT/ML Solostar Pen Generic drug: insulin  glargine Inject 18 Units into the skin at bedtime.   methimazole  5 MG tablet Commonly known as: TAPAZOLE  Take 1 tablet (5 mg total) by mouth every morning.   metoCLOPramide  10 MG tablet Commonly known as: REGLAN  Take 1 tablet (10 mg total) by mouth every 6 (six) hours.   metoprolol  tartrate 50 MG  tablet Commonly known as: LOPRESSOR  Take 1 tablet (50 mg total) by mouth 2 (two) times daily.   nystatin  100000 UNIT/ML suspension Commonly known as: MYCOSTATIN  Take 5 mLs (500,000 Units total) by mouth 4 (four) times daily. Started by: Queena Monrreal C Patrich Heinze   olmesartan -hydrochlorothiazide  40-25 MG tablet Commonly known as: BENICAR  HCT Take 1 tablet by mouth daily.   pantoprazole  40 MG tablet Commonly known as: PROTONIX  Take 1 tablet (40 mg total) by mouth 2 (two) times daily.   polyethylene glycol powder 17 GM/SCOOP powder Commonly known as: GLYCOLAX /MIRALAX  Take 17 g by mouth daily.   ranolazine  500 MG 12 hr tablet Commonly known as: RANEXA  Take 1 tablet (500 mg total) by mouth 2 (two) times daily.   senna-docusate 8.6-50 MG tablet Commonly known as: Senokot-S Take 2 tablets by mouth 2 (two) times daily.   sucralfate  1 GM/10ML suspension Commonly known as: CARAFATE  SHAKE LIQUID AND TAKE 10 ML BY MOUTH AT BEDTIME   tamsulosin  0.4 MG Caps capsule Commonly known as: FLOMAX  TAKE 1 CAPSULE BY MOUTH AT BEDTIME   topiramate  25 MG tablet Commonly known as: TOPAMAX  TAKE 1 TABLET BY MOUTH TWICE DAILY   Xarelto  10 MG Tabs tablet Generic drug: rivaroxaban  TAKE 1 TABLET BY MOUTH DAILY   zolpidem  12.5 MG CR tablet Commonly known as: AMBIEN  CR Take 1 tablet (12.5 mg total) by mouth at bedtime as needed for sleep.        Review of Systems  Constitutional:  Negative for appetite change, chills, fatigue, fever and unexpected weight change.  HENT:  Negative for congestion, dental problem, ear discharge, ear pain, facial swelling, hearing loss, nosebleeds, postnasal drip, rhinorrhea, sinus pressure, sinus pain, sneezing, sore throat, tinnitus and trouble swallowing.   Eyes:  Negative for pain, discharge, redness, itching and visual disturbance.  Respiratory:  Positive for cough. Negative for chest tightness, shortness of breath and wheezing.        Cough has improved   Cardiovascular:  Negative for chest pain, palpitations and leg swelling.  Gastrointestinal:  Positive for constipation and nausea. Negative for abdominal distention, abdominal pain, blood in stool, diarrhea and vomiting.       Chronic nausea   Endocrine: Negative for cold intolerance, heat intolerance, polydipsia, polyphagia and polyuria.  Genitourinary:  Positive for dysuria. Negative for difficulty urinating, flank pain, frequency and urgency.       Incontinent uses Pure Wick  Musculoskeletal:  Positive for back pain and gait problem. Negative for arthralgias, joint swelling, myalgias, neck pain and neck stiffness.  Skin:  Negative for color change, pallor, rash and wound.  Neurological:  Positive for speech difficulty, weakness and numbness. Negative for dizziness, syncope, light-headedness and headaches.       Hx of stroke with impaired speech and left side weakness  Hematological:  Does not bruise/bleed easily.  Psychiatric/Behavioral:  Positive for sleep disturbance. Negative for agitation, behavioral problems, confusion, hallucinations, self-injury and suicidal ideas. The patient is not nervous/anxious.     Immunization History  Administered Date(s) Administered   Influenza-Unspecified 11/04/2016   PPD Test 03/19/2016   Pfizer(Comirnaty)Fall Seasonal Vaccine 12 years and older 09/29/2019, 03/31/2020   Tdap 02/07/2014   Pertinent  Health Maintenance Due  Topic Date Due   FOOT EXAM  Never done   Mammogram  Never done   Colonoscopy  Never done   Influenza Vaccine  06/15/2024 (Originally 09/05/2023)   HEMOGLOBIN A1C  01/29/2024   OPHTHALMOLOGY EXAM  02/19/2024      05/15/2023   10:48 AM 06/13/2023   10:06 AM 07/29/2023   10:40 AM 10/10/2023    9:31 AM 10/27/2023    1:21 PM  Fall Risk  Falls in the past year? 0 0 Exclusion - non ambulatory 0 0  Was there an injury with Fall? 0 0   0  Fall Risk Category Calculator 0 0   0  Patient at Risk for Falls Due to Impaired  balance/gait;Impaired mobility   Impaired balance/gait;Impaired mobility No Fall Risks  Fall risk Follow up Falls evaluation completed    Falls evaluation completed   Functional Status Survey:    Vitals:   10/27/23 1329  BP: (!) 140/88  Pulse: 96  Resp: 18  Temp: 97.7 F (36.5 C)  SpO2: 98%  Weight: 163 lb 6.4 oz (74.1 kg)  Height: 5' 2 (1.575 m)   Body mass index is 29.89 kg/m. Physical Exam  VITALS: T- 97.7, P- 96, BP- 140/88, SaO2- 98% MEASUREMENTS: Weight- 163.4. GENERAL: Alert, cooperative, well developed, no acute distress HEENT: Normocephalic, normal oropharynx, moist mucous membranes, ears and nose normal, no oral thrush CHEST: Clear to auscultation bilaterally, no wheezes, rhonchi, or crackles CARDIOVASCULAR: Normal heart rate and rhythm, S1 and S2 normal without murmurs ABDOMEN: Soft, non-tender, non-distended, without organomegaly, normal bowel sounds EXTREMITIES: No cyanosis or edema NEUROLOGICAL: Cranial nerves grossly intact, Left hand contracture and Left leg weakness.Unsteady gait  SKIN: No rash,no lesion or erythema   PSYCHIATRY/BEHAVIORAL: Mood stable Impaired speech   Labs reviewed: Recent Labs    12/02/22 0530 12/05/22 0500 01/02/23 0440 01/03/23 1104 04/29/23 0519 04/30/23 0520 05/11/23 0851 05/12/23 0529 05/13/23 0453 05/15/23 1126 06/10/23 0410 06/15/23 0744 08/01/23 1621  NA 136   < > 130*   < > 144   < > 135   < > 131*   < > 136 140 136  K 4.0   < > 3.3*   < > 3.8   < > 2.9*   < > 3.2*   < > 3.7 3.4* 4.3  CL 105   < > 97*   < > 110   < > 102   < > 102   < > 108 107 105  CO2 23   < > 20*   < > 25   < > 22   < > 23   < > 21* 21* 19*  GLUCOSE 196*   < > 235*   < > 103*   < > 204*   < > 180*   < > 166* 182* 254*  BUN 20   < > 8   < >  25*   < > 10   < > 10   < > 6 6 15   CREATININE 0.94   < > 0.77   < > 1.46*   < > 0.72   < > 0.80   < > 0.80 0.78 0.72  CALCIUM  8.9   < > 8.5*   < > 9.1   < > 8.4*   < > 7.9*   < > 8.2* 9.4 9.4  MG 1.7  --   1.7   < > 1.7  --  1.4*  --  1.7  --   --   --   --   PHOS 4.1  --  3.3  --  3.5  --   --   --   --   --   --   --   --    < > = values in this interval not displayed.   Recent Labs    06/09/23 0304 06/10/23 0410 06/15/23 0744  AST 11* 11* 14*  ALT 11 11 11   ALKPHOS 57 56 60  BILITOT 0.5 0.4 0.6  PROT 5.5* 5.4* 6.9  ALBUMIN  2.4* 2.3* 3.3*   Recent Labs    06/02/23 2328 06/04/23 0342 06/10/23 0410 06/15/23 0744 06/27/23 1459  WBC 11.3*   < > 7.3 14.3* 7.7  NEUTROABS 10.5*  --   --  11.0* 4,397  HGB 11.1*   < > 9.6* 12.2 13.1  HCT 33.3*   < > 28.5* 36.6 43.2  MCV 81.0   < > 80.3 78.7* 87.1  PLT 348   < > 447* 640* 443*   < > = values in this interval not displayed.   Lab Results  Component Value Date   TSH 0.416 06/03/2023   Lab Results  Component Value Date   HGBA1C 9.0 (A) 07/30/2023   Lab Results  Component Value Date   CHOL 273 (H) 04/25/2023   HDL 55 04/25/2023   LDLCALC 187 (H) 04/25/2023   TRIG 165 (H) 04/25/2023   CHOLHDL 5.0 (H) 04/25/2023    Significant Diagnostic Results in last 30 days:  No results found.  Assessment/Plan     Type 2 diabetes mellitus with poor glycemic control A1c is elevated at 10.5, indicating poor glycemic control. Blood sugars fluctuate significantly, with episodes of hyperglycemia and hypoglycemia. Preference for sweet foods and drinks contributes to poor control. Risks of high blood sugar, including kidney damage, heart issues, neuropathy, and vision problems, were discussed. - Continue follow-up with endocrinologist in October. - Encourage consistent meal intake to prevent blood sugar fluctuations. - Advise reduction of sweets and carbohydrates. - Encourage drinking plain water  instead of sweetened beverages.  Hypertension Blood pressure is 140/88, above the target of less than 130/80. Discussed the impact of sodium intake on blood pressure, especially post-stroke. - Continue current antihypertensive regimen. - Advise  reduction of sodium intake.  Dyslipidemia On atorvastatin  80 mg. Discussed the impact of diet, particularly deep-fried foods, on cholesterol levels. - Continue atorvastatin  80 mg. - Advise against consumption of deep-fried foods.  Cerebrovascular accident with left hand contracture Continues to use a walker for mobility and requires assistance getting out of bed. Left hand contracture persists, impacting daily activities. - Continue using walker for mobility support.  Chronic pain with neuropathic features Reports nerve pain and requests gabapentin , which she previously ran out of. Currently on baclofen  and Flexeril  for muscle relaxation. Prefers Flexeril  over baclofen . - Discontinue baclofen  as she prefers Flexeril . - Refill gabapentin  prescription.  Hyperthyroidism Under the care of an endocrinologist for hyperthyroidism and is on methimazole . - Continue methimazole  as prescribed by endocrinologist. - Follow up with endocrinologist in October.  Primary insomnia On Ambien  for insomnia. - Refill Ambien  prescription.  Slow transit constipation Reports persistent constipation despite using Miralax  and Senokot. Suspects medication may contribute to constipation. - Continue Miralax  and Senokot as needed. - Evaluate medication list for potential contributors to constipation.  Chronic cough Reports a chronic cough without fever or chills. Lungs are clear on examination. - Prescribe Mucinex , one in the morning and one in the evening.  Oral candidiasis Oral thrush with visible spots on the tongue. Not currently using nystatin . - Prescribe nystatin  oral suspension.  Migraine On Topamax  for migraine management. - Continue Topamax  as prescribed.  Depression and Anxiety  - Mood stable  On Lexapro  20 mg. - Continue Lexapro  20 mg.   Family/ staff Communication: Reviewed plan of care with patient and spouse verbalized understanding   Labs/tests ordered:  - CBC with  Differential/Platelet - CMP with eGFR([facility]) - Lipid panel - Urine culture   Next Appointment : Return in about 6 months (around 04/25/2024) for medical mangement of chronic issues.SABRA   Spent 30 minutes of Face to face and non-face to face with patient  >50% time spent counseling; reviewing medical record; tests; labs; documentation and developing future plan of care.   Roxan JAYSON Plough, NP

## 2023-10-27 NOTE — Patient Instructions (Signed)
 1.Stop at check out and schedule Annual Wellness Visit.

## 2023-10-27 NOTE — Telephone Encounter (Signed)
 Patient scheduled to see PCP Ngetich, [name], NP today. Message routed to PCP and Medical Assistant Demetria.W/CMA as RICK.

## 2023-10-27 NOTE — Telephone Encounter (Signed)
 Noted

## 2023-10-28 ENCOUNTER — Encounter (HOSPITAL_BASED_OUTPATIENT_CLINIC_OR_DEPARTMENT_OTHER): Payer: Self-pay | Admitting: Physical Medicine & Rehabilitation

## 2023-10-28 ENCOUNTER — Encounter: Payer: Self-pay | Admitting: Physical Medicine & Rehabilitation

## 2023-10-28 ENCOUNTER — Ambulatory Visit: Payer: Self-pay | Admitting: Family

## 2023-10-28 VITALS — BP 111/74 | HR 111 | Ht 62.0 in

## 2023-10-28 DIAGNOSIS — I5022 Chronic systolic (congestive) heart failure: Secondary | ICD-10-CM | POA: Diagnosis not present

## 2023-10-28 DIAGNOSIS — I10 Essential (primary) hypertension: Secondary | ICD-10-CM | POA: Diagnosis not present

## 2023-10-28 DIAGNOSIS — I639 Cerebral infarction, unspecified: Secondary | ICD-10-CM | POA: Diagnosis not present

## 2023-10-28 DIAGNOSIS — D649 Anemia, unspecified: Secondary | ICD-10-CM

## 2023-10-28 DIAGNOSIS — G8114 Spastic hemiplegia affecting left nondominant side: Secondary | ICD-10-CM | POA: Diagnosis not present

## 2023-10-28 DIAGNOSIS — I6930 Unspecified sequelae of cerebral infarction: Secondary | ICD-10-CM | POA: Diagnosis not present

## 2023-10-28 DIAGNOSIS — Z91199 Patient's noncompliance with other medical treatment and regimen due to unspecified reason: Secondary | ICD-10-CM | POA: Diagnosis not present

## 2023-10-28 DIAGNOSIS — J690 Pneumonitis due to inhalation of food and vomit: Secondary | ICD-10-CM | POA: Diagnosis not present

## 2023-10-28 DIAGNOSIS — Z993 Dependence on wheelchair: Secondary | ICD-10-CM | POA: Diagnosis not present

## 2023-10-28 LAB — CBC WITH DIFFERENTIAL/PLATELET
Absolute Lymphocytes: 2957 {cells}/uL (ref 850–3900)
Absolute Monocytes: 414 {cells}/uL (ref 200–950)
Basophils Absolute: 70 {cells}/uL (ref 0–200)
Basophils Relative: 0.8 %
Eosinophils Absolute: 194 {cells}/uL (ref 15–500)
Eosinophils Relative: 2.2 %
HCT: 43.6 % (ref 35.0–45.0)
Hemoglobin: 13.5 g/dL (ref 11.7–15.5)
MCH: 25 pg — ABNORMAL LOW (ref 27.0–33.0)
MCHC: 31 g/dL — ABNORMAL LOW (ref 32.0–36.0)
MCV: 80.9 fL (ref 80.0–100.0)
MPV: 9.9 fL (ref 7.5–12.5)
Monocytes Relative: 4.7 %
Neutro Abs: 5166 {cells}/uL (ref 1500–7800)
Neutrophils Relative %: 58.7 %
Platelets: 429 Thousand/uL — ABNORMAL HIGH (ref 140–400)
RBC: 5.39 Million/uL — ABNORMAL HIGH (ref 3.80–5.10)
RDW: 17 % — ABNORMAL HIGH (ref 11.0–15.0)
Total Lymphocyte: 33.6 %
WBC: 8.8 Thousand/uL (ref 3.8–10.8)

## 2023-10-28 LAB — COMPREHENSIVE METABOLIC PANEL WITH GFR
AG Ratio: 1.1 (calc) (ref 1.0–2.5)
ALT: 15 U/L (ref 6–29)
AST: 14 U/L (ref 10–35)
Albumin: 3.9 g/dL (ref 3.6–5.1)
Alkaline phosphatase (APISO): 101 U/L (ref 37–153)
BUN/Creatinine Ratio: 23 (calc) — ABNORMAL HIGH (ref 6–22)
BUN: 29 mg/dL — ABNORMAL HIGH (ref 7–25)
CO2: 24 mmol/L (ref 20–32)
Calcium: 9.2 mg/dL (ref 8.6–10.4)
Chloride: 101 mmol/L (ref 98–110)
Creat: 1.28 mg/dL — ABNORMAL HIGH (ref 0.50–1.03)
Globulin: 3.7 g/dL (ref 1.9–3.7)
Glucose, Bld: 138 mg/dL (ref 65–139)
Potassium: 4 mmol/L (ref 3.5–5.3)
Sodium: 136 mmol/L (ref 135–146)
Total Bilirubin: 0.4 mg/dL (ref 0.2–1.2)
Total Protein: 7.6 g/dL (ref 6.1–8.1)
eGFR: 51 mL/min/1.73m2 — ABNORMAL LOW (ref >=60)

## 2023-10-28 LAB — LIPID PANEL
Cholesterol: 158 mg/dL (ref <200)
HDL: 57 mg/dL (ref >=50)
LDL Cholesterol (Calc): 81 mg/dL
Non-HDL Cholesterol (Calc): 101 mg/dL (ref <130)
Total CHOL/HDL Ratio: 2.8 (calc) (ref <5.0)
Triglycerides: 105 mg/dL (ref <150)

## 2023-10-28 MED ORDER — ONABOTULINUMTOXINA 100 UNITS IJ SOLR
400.0000 [IU] | Freq: Once | INTRAMUSCULAR | Status: AC
  Administered 2023-10-28: 400 [IU] via INTRAMUSCULAR

## 2023-10-28 MED ORDER — SODIUM CHLORIDE (PF) 0.9 % IJ SOLN
8.0000 mL | Freq: Once | INTRAMUSCULAR | Status: AC
  Administered 2023-10-28: 8 mL

## 2023-10-28 NOTE — Progress Notes (Signed)
 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 This includes bleeding, bruising, infection, excessive weakness, or medication side effects. A REMS form is on file and signed. Needle: 27g 1 needle electrode Number of units per muscle Total dose 400U LEFT Biceps50 2+ Brachialis 75 1+ ECRL 50 2+  FDS50 2+ FDP50 1+ Opponens pollicis 25 2+ Lumbricals 75 0-1+ PT 25 2+ All injections were done after obtaining appropriate EMG activity and after negative drawback for blood. Pt with iohexol  allergy , had topical betadine last time Aug 2024 without issue but used chlorhexidine  today . Post procedure instructions given   F/u 6 wks to assess efficacy ,

## 2023-10-29 ENCOUNTER — Telehealth: Payer: Self-pay

## 2023-10-29 DIAGNOSIS — M24542 Contracture, left hand: Secondary | ICD-10-CM

## 2023-10-29 DIAGNOSIS — Z8673 Personal history of transient ischemic attack (TIA), and cerebral infarction without residual deficits: Secondary | ICD-10-CM

## 2023-10-29 DIAGNOSIS — R2681 Unsteadiness on feet: Secondary | ICD-10-CM

## 2023-10-29 NOTE — Telephone Encounter (Signed)
 Copied from CRM #[id number]. Topic: Referral - Question >> Oct 29, 2023 11:08 AM Miquel SAILOR wrote: Reason for CRM: Ambulatory referral to Home Health (Order # [id number]) on 07/31/2023-Patient requesting new order due to her just ended today. Needs call back omn update. [phone]

## 2023-10-29 NOTE — Telephone Encounter (Signed)
 Please verify order for home health Physical therapy or HH aide ?

## 2023-10-29 NOTE — Telephone Encounter (Signed)
 Referral sent for [facility] Aide.

## 2023-10-29 NOTE — Telephone Encounter (Signed)
Patient would like a home health aid

## 2023-10-29 NOTE — Telephone Encounter (Signed)
 Patient is requesting a callback regarding referral. She stated  that she would like it sent to [facility] [employer] because medicaid will not cover it.

## 2023-10-30 DIAGNOSIS — I2510 Atherosclerotic heart disease of native coronary artery without angina pectoris: Secondary | ICD-10-CM | POA: Diagnosis not present

## 2023-10-30 DIAGNOSIS — E119 Type 2 diabetes mellitus without complications: Secondary | ICD-10-CM | POA: Diagnosis not present

## 2023-10-30 DIAGNOSIS — I69354 Hemiplegia and hemiparesis following cerebral infarction affecting left non-dominant side: Secondary | ICD-10-CM | POA: Diagnosis not present

## 2023-10-30 DIAGNOSIS — I10 Essential (primary) hypertension: Secondary | ICD-10-CM | POA: Diagnosis not present

## 2023-10-30 DIAGNOSIS — E079 Disorder of thyroid, unspecified: Secondary | ICD-10-CM | POA: Diagnosis not present

## 2023-10-30 NOTE — Telephone Encounter (Signed)
 Please review

## 2023-10-31 DIAGNOSIS — E079 Disorder of thyroid, unspecified: Secondary | ICD-10-CM | POA: Diagnosis not present

## 2023-10-31 DIAGNOSIS — I69354 Hemiplegia and hemiparesis following cerebral infarction affecting left non-dominant side: Secondary | ICD-10-CM | POA: Diagnosis not present

## 2023-10-31 DIAGNOSIS — I2510 Atherosclerotic heart disease of native coronary artery without angina pectoris: Secondary | ICD-10-CM | POA: Diagnosis not present

## 2023-10-31 DIAGNOSIS — E119 Type 2 diabetes mellitus without complications: Secondary | ICD-10-CM | POA: Diagnosis not present

## 2023-10-31 DIAGNOSIS — I10 Essential (primary) hypertension: Secondary | ICD-10-CM | POA: Diagnosis not present

## 2023-11-01 DIAGNOSIS — I69354 Hemiplegia and hemiparesis following cerebral infarction affecting left non-dominant side: Secondary | ICD-10-CM | POA: Diagnosis not present

## 2023-11-01 DIAGNOSIS — E119 Type 2 diabetes mellitus without complications: Secondary | ICD-10-CM | POA: Diagnosis not present

## 2023-11-01 DIAGNOSIS — I10 Essential (primary) hypertension: Secondary | ICD-10-CM | POA: Diagnosis not present

## 2023-11-01 DIAGNOSIS — E079 Disorder of thyroid, unspecified: Secondary | ICD-10-CM | POA: Diagnosis not present

## 2023-11-01 DIAGNOSIS — I2510 Atherosclerotic heart disease of native coronary artery without angina pectoris: Secondary | ICD-10-CM | POA: Diagnosis not present

## 2023-11-01 LAB — URINE CULTURE
MICRO NUMBER:: 17021969
SPECIMEN QUALITY:: ADEQUATE

## 2023-11-02 ENCOUNTER — Other Ambulatory Visit: Payer: Self-pay | Admitting: Family

## 2023-11-03 ENCOUNTER — Telehealth: Payer: Self-pay

## 2023-11-03 ENCOUNTER — Ambulatory Visit: Admitting: Family

## 2023-11-03 NOTE — Telephone Encounter (Signed)
 Notify patient Zolpidem  not covered by insurance.

## 2023-11-03 NOTE — Telephone Encounter (Signed)
**Note De-identified  [place] Obfuscation** Please advise 

## 2023-11-04 DIAGNOSIS — E119 Type 2 diabetes mellitus without complications: Secondary | ICD-10-CM | POA: Diagnosis not present

## 2023-11-04 DIAGNOSIS — I69354 Hemiplegia and hemiparesis following cerebral infarction affecting left non-dominant side: Secondary | ICD-10-CM | POA: Diagnosis not present

## 2023-11-04 DIAGNOSIS — I10 Essential (primary) hypertension: Secondary | ICD-10-CM | POA: Diagnosis not present

## 2023-11-04 DIAGNOSIS — I2510 Atherosclerotic heart disease of native coronary artery without angina pectoris: Secondary | ICD-10-CM | POA: Diagnosis not present

## 2023-11-04 DIAGNOSIS — E079 Disorder of thyroid, unspecified: Secondary | ICD-10-CM | POA: Diagnosis not present

## 2023-11-04 MED ORDER — ESZOPICLONE 2 MG PO TABS: 2.0000 mg | ORAL_TABLET | Freq: Every evening | ORAL | 1 refills | Status: DC | PRN

## 2023-11-04 NOTE — Telephone Encounter (Signed)
 Left message on voicemail for patient to return call when available , E2C2 agent to share providers message that Zolpidem  (Ambien ) is not covered by insurance and confirmed patient is ok with taking an alternative.

## 2023-11-04 NOTE — Telephone Encounter (Signed)
 Patient is aware and ok with an alternative. Alternatives were listed on original message.  Patient would like alternative sent to [facility]

## 2023-11-04 NOTE — Telephone Encounter (Signed)
-   discontinue Ambien   - Start on Eszopiclone 2 mg tablet one by mouth at bedtime as needed for sleep.

## 2023-11-04 NOTE — Telephone Encounter (Signed)
 Patient is returning missed call. Transferred to clinic.

## 2023-11-05 ENCOUNTER — Telehealth: Payer: Self-pay

## 2023-11-05 ENCOUNTER — Other Ambulatory Visit: Payer: Self-pay

## 2023-11-05 DIAGNOSIS — N390 Urinary tract infection, site not specified: Secondary | ICD-10-CM

## 2023-11-05 DIAGNOSIS — D649 Anemia, unspecified: Secondary | ICD-10-CM

## 2023-11-05 HISTORY — DX: Urinary tract infection, site not specified: N39.0

## 2023-11-05 NOTE — Telephone Encounter (Signed)
Please see result note encounter. 

## 2023-11-05 NOTE — Telephone Encounter (Signed)
 Copied from CRM #[id number]. Topic: Clinical - Lab/Test Results >> Nov 05, 2023 10:34 AM Farrel B wrote: Reason for CRM: Patient called from [phone] (M)stated she was returning a call in regards to her lab results, called CAL Gregery) and stated [name] was going in with a patient but she was able to transfer me over to the clinical line, no one answered but I left a VM with the patient name and number, as well as sending over a CRM to have someone call the patient back. [name] did state if she doesn't answer the phone you are welcome to leave the test results on the vm

## 2023-11-06 ENCOUNTER — Other Ambulatory Visit

## 2023-11-06 DIAGNOSIS — E079 Disorder of thyroid, unspecified: Secondary | ICD-10-CM | POA: Diagnosis not present

## 2023-11-06 DIAGNOSIS — I69354 Hemiplegia and hemiparesis following cerebral infarction affecting left non-dominant side: Secondary | ICD-10-CM | POA: Diagnosis not present

## 2023-11-06 DIAGNOSIS — E119 Type 2 diabetes mellitus without complications: Secondary | ICD-10-CM | POA: Diagnosis not present

## 2023-11-06 DIAGNOSIS — I2510 Atherosclerotic heart disease of native coronary artery without angina pectoris: Secondary | ICD-10-CM | POA: Diagnosis not present

## 2023-11-06 DIAGNOSIS — I10 Essential (primary) hypertension: Secondary | ICD-10-CM | POA: Diagnosis not present

## 2023-11-07 ENCOUNTER — Other Ambulatory Visit: Payer: Self-pay | Admitting: Family

## 2023-11-07 ENCOUNTER — Telehealth: Payer: Self-pay | Admitting: Family

## 2023-11-07 ENCOUNTER — Other Ambulatory Visit

## 2023-11-07 ENCOUNTER — Ambulatory Visit: Payer: Self-pay

## 2023-11-07 DIAGNOSIS — D649 Anemia, unspecified: Secondary | ICD-10-CM | POA: Diagnosis not present

## 2023-11-07 LAB — IRON,TIBC AND FERRITIN PANEL
%SAT: 11 % — ABNORMAL LOW (ref 16–45)
Ferritin: 26 ng/mL (ref 16–232)
Iron: 37 ug/dL — ABNORMAL LOW (ref 45–160)
TIBC: 326 ug/dL (ref 250–450)

## 2023-11-07 NOTE — Telephone Encounter (Signed)
 FL2 created electronically and routed to Ngetich, Dinah C, NP for completion  Patient would like a call once FL2 is complete for pick-up

## 2023-11-07 NOTE — Telephone Encounter (Signed)
 Opened in error . disregard

## 2023-11-07 NOTE — Telephone Encounter (Signed)
 Patient brought in an FL2 form to be completed saying she don't want to lose her aid and need this completed immediately. She was informed that we could not promise that it would begiven back today but as soon as its done will call for her to pick it up. Given to Chrae in CI

## 2023-11-07 NOTE — Telephone Encounter (Signed)
 [name] or Action Required?: [name] for provider.  Patient was last seen in primary care on 10/27/2023 by Ngetich, [name], NP.  Called Nurse Triage reporting Dysuria.  Symptoms began a week ago.  Interventions attempted: OTC medications: Tylenol .  Symptoms are: gradually worsening.  Triage Disposition: See PCP Within 2 Weeks  Patient/caregiver understands and will follow disposition?: Yes  Copied from CRM [id number]. Topic: Clinical - Red Word Triage >> Nov 07, 2023  2:10 PM [name] wrote: Red Word that prompted transfer to Nurse Triage: Patient is calling in stating she believes she has a UTI, patient states it is getting worse since her appointment, it hurts and burns when she uses the bathroom.  Reason for Disposition  All other urine symptoms  Answer Assessment - Initial Assessment Questions 1. SYMPTOM: What's the main symptom you're concerned about? (e.g., frequency, incontinence)     Burning with urination.   2. ONSET: When did the burning with urination start?     Past week.  3. PAIN: Is there any pain? If Yes, ask: How bad is it? (Scale: 1-10; mild, moderate, severe)     Moderate burning pain.  4. CAUSE: What do you think is causing the symptoms?     Unsure. Pt reports using a purewick for the past year.  5. OTHER SYMPTOMS: Do you have any other symptoms? (e.g., blood in urine, fever, flank pain, pain with urination)     No. Sent in urine sample last week.  Protocols used: Urinary Symptoms-A-AH

## 2023-11-10 ENCOUNTER — Telehealth: Payer: Self-pay

## 2023-11-10 ENCOUNTER — Ambulatory Visit: Admitting: Family

## 2023-11-10 ENCOUNTER — Telehealth: Payer: Self-pay | Admitting: Pharmacist

## 2023-11-10 DIAGNOSIS — I2510 Atherosclerotic heart disease of native coronary artery without angina pectoris: Secondary | ICD-10-CM | POA: Diagnosis not present

## 2023-11-10 DIAGNOSIS — I69354 Hemiplegia and hemiparesis following cerebral infarction affecting left non-dominant side: Secondary | ICD-10-CM | POA: Diagnosis not present

## 2023-11-10 DIAGNOSIS — Z79899 Other long term (current) drug therapy: Secondary | ICD-10-CM

## 2023-11-10 DIAGNOSIS — E119 Type 2 diabetes mellitus without complications: Secondary | ICD-10-CM | POA: Diagnosis not present

## 2023-11-10 DIAGNOSIS — E079 Disorder of thyroid, unspecified: Secondary | ICD-10-CM | POA: Diagnosis not present

## 2023-11-10 DIAGNOSIS — I10 Essential (primary) hypertension: Secondary | ICD-10-CM | POA: Diagnosis not present

## 2023-11-10 MED ORDER — BACLOFEN 10 MG PO TABS: 10.0000 mg | ORAL_TABLET | Freq: Three times a day (TID) | ORAL | 1 refills | Status: DC

## 2023-11-10 NOTE — Telephone Encounter (Signed)
 Form was given to patients boyfriend whom was waiting in the lobby

## 2023-11-10 NOTE — Telephone Encounter (Signed)
 F L 2 form printed and signed.

## 2023-11-10 NOTE — Telephone Encounter (Signed)
 [name], [facility]  (Selected Message)     11/10/23  1:31 PM Patient uses [employer]. Spoke with her. She was able to get [employer] (parent company for [employer] replace the sensor at no charge,   Requested refill on baclofen    [name], PharmD, BCACP Clinical Pharmacist [phone]  Noted, the message taken by E2C2 agent stated Dexcom. RX for baclofen  sent to pharmacy, [facility] (spoke with patient to confirm as she has several pharmacy's on her profile).

## 2023-11-10 NOTE — Telephone Encounter (Signed)
 1.) Dinah please advise on status of FL2. Once completed should be printed, signed, and given to admin staff to notify patient that it is ready for pick-up.  2.) Katina, please advise if you can assist patient with Dexcom Error or if patient needs to call a Dexcom support representative

## 2023-11-10 NOTE — Telephone Encounter (Signed)
FL2 form completed.  

## 2023-11-10 NOTE — Telephone Encounter (Signed)
 Copied from CRM [id number]. Topic: Clinical - Prescription Issue >> Nov 10, 2023  9:07 AM Lori [name] wrote: Reason for CRM: PT needs forms to pickup for her evalution for today at 3pm.   Also her Dexcom G7 sensor is not working for PT readings coming up air. Needs call back [phone]

## 2023-11-10 NOTE — Telephone Encounter (Signed)
 Patient follows up with Endocrinologist.

## 2023-11-11 ENCOUNTER — Ambulatory Visit: Payer: Self-pay | Admitting: Family

## 2023-11-11 ENCOUNTER — Encounter: Admitting: Family

## 2023-11-11 DIAGNOSIS — I10 Essential (primary) hypertension: Secondary | ICD-10-CM | POA: Diagnosis not present

## 2023-11-11 DIAGNOSIS — E119 Type 2 diabetes mellitus without complications: Secondary | ICD-10-CM | POA: Diagnosis not present

## 2023-11-11 DIAGNOSIS — I2510 Atherosclerotic heart disease of native coronary artery without angina pectoris: Secondary | ICD-10-CM | POA: Diagnosis not present

## 2023-11-11 DIAGNOSIS — E079 Disorder of thyroid, unspecified: Secondary | ICD-10-CM | POA: Diagnosis not present

## 2023-11-11 DIAGNOSIS — I69354 Hemiplegia and hemiparesis following cerebral infarction affecting left non-dominant side: Secondary | ICD-10-CM | POA: Diagnosis not present

## 2023-11-12 ENCOUNTER — Ambulatory Visit (INDEPENDENT_AMBULATORY_CARE_PROVIDER_SITE_OTHER): Admitting: Endocrinology

## 2023-11-12 ENCOUNTER — Encounter: Payer: Self-pay | Admitting: Endocrinology

## 2023-11-12 ENCOUNTER — Other Ambulatory Visit

## 2023-11-12 ENCOUNTER — Ambulatory Visit: Admitting: Family

## 2023-11-12 ENCOUNTER — Encounter: Payer: Self-pay | Admitting: Family

## 2023-11-12 VITALS — BP 128/72 | HR 128 | Temp 97.8°F | Resp 19 | Ht 62.0 in | Wt 171.8 lb

## 2023-11-12 VITALS — BP 126/60 | HR 128 | Resp 20 | Ht 62.0 in | Wt 170.4 lb

## 2023-11-12 DIAGNOSIS — R000 Tachycardia, unspecified: Secondary | ICD-10-CM

## 2023-11-12 DIAGNOSIS — R350 Frequency of micturition: Secondary | ICD-10-CM

## 2023-11-12 DIAGNOSIS — E0590 Thyrotoxicosis, unspecified without thyrotoxic crisis or storm: Secondary | ICD-10-CM

## 2023-11-12 DIAGNOSIS — B372 Candidiasis of skin and nail: Secondary | ICD-10-CM

## 2023-11-12 DIAGNOSIS — Z86018 Personal history of other benign neoplasm: Secondary | ICD-10-CM

## 2023-11-12 DIAGNOSIS — E1165 Type 2 diabetes mellitus with hyperglycemia: Secondary | ICD-10-CM | POA: Diagnosis not present

## 2023-11-12 DIAGNOSIS — Z794 Long term (current) use of insulin: Secondary | ICD-10-CM

## 2023-11-12 DIAGNOSIS — E1169 Type 2 diabetes mellitus with other specified complication: Secondary | ICD-10-CM

## 2023-11-12 MED ORDER — FLUCONAZOLE 150 MG PO TABS: ORAL_TABLET | ORAL | 0 refills | Status: DC

## 2023-11-12 MED ORDER — NYSTATIN 100000 UNIT/GM EX CREA: 1.0000 | TOPICAL_CREAM | Freq: Two times a day (BID) | CUTANEOUS | 0 refills | Status: DC

## 2023-11-12 MED ORDER — FREESTYLE LIBRE 3 PLUS SENSOR MISC: Status: DC

## 2023-11-12 MED ORDER — BACLOFEN 10 MG PO TABS: 10.0000 mg | ORAL_TABLET | Freq: Three times a day (TID) | ORAL | 1 refills | Status: DC

## 2023-11-12 NOTE — Progress Notes (Signed)
   11/12/2023  Patient ID: Lori [name], female   DOB: 1970/02/15, 53 y.o.   MRN: [id number]  Patient was called regarding [employer] sensors.  HIPAA identifiers were obtained.  Once I explained why I was calling, Patient said she was able to get replacement sensors from the company and no longer needed assistance. She did ask for refills on baclofen . A message was sent to the clinical team at [facility] with the refills.  Lab Results  Component Value Date   HGBA1C 9.1 (H) 11/17/2023   HGBA1C 9.0 (A) 07/30/2023   HGBA1C 10.0 (H) 04/25/2023    Most recent HgA1c is elevated at 9.1%  Saw Endocrinology on 11/12/23- no medication changes.  Ed visit -Chest pain 11/17/23  Plan: Follow up with the Patient in 1 week.   Cassius DOROTHA Brought, PharmD, BCACP Clinical Pharmacist [phone]

## 2023-11-12 NOTE — Patient Instructions (Signed)
 Glimepiride Tablets What is this medication? GLIMEPIRIDE (GLYE me pye ride) treats type 2 diabetes. It works by increasing insulin levels in your body, which decreases your blood sugar (glucose). It also helps your body use insulin more effectively. It belongs to a group of medications called sulfonylureas. Changes to diet and exercise are often combined with this medication. This medicine may be used for other purposes; ask your health care provider or pharmacist if you have questions. COMMON BRAND NAME(S): Amaryl What should I tell my care team before I take this medication? They need to know if you have any of these conditions: Diabetic ketoacidosis G6PD deficiency Heart disease Kidney disease Liver disease Severe infection or injury Thyroid disease An unusual or allergic reaction to glimepiride, other medications, foods, dyes, or preservatives Pregnancy or recent attempts to get pregnant Breastfeeding How should I use this medication? Take this medication by mouth. Take it as directed on the prescription label at the same time every day. Take it with food. Talk to your care team about the use of this medication in children. Special care may be needed. People 20 year and older may have a stronger reaction and need a smaller dose. Overdosage: If you think you have taken too much of this medicine contact a poison control center or emergency room at once. NOTE: This medicine is only for you. Do not share this medicine with others. What if I miss a dose? If you miss a dose, take it as soon as you can. If it is almost time for your next dose, take only that dose. Do not take double or extra doses. What may interact with this medication? Do not take this medication with any of the following: Bosentan Chloramphenicol Cisapride Clarithromycin Medications for fungal or yeast infections Metoclopramide Probenecid Warfarin This medication may also interact with the  following: Alcohol Aspirin and aspirin-like medications Certain medications for heart disease, such as disopyramide Chloramphenicol Chromium Diuretics Estrogen or progestin hormones Fluoxetine Isoniazid MAOIs, such as Nardil, Parnate, Marplan, Eldepryl Medications for allergies, asthma, cold, or cough Medications for mental health conditions Medications for weight loss Niacin NSAIDs, medications for pain and inflammation, such as ibuprofen or naproxen Pentamidine Phenytoin Probenecid Quinolone antibiotics, such as ciprofloxacin, levofloxacin, ofloxacin Some herbal dietary supplements Steroid medications, such as prednisone or cortisone Testosterone Thyroid medication This list may not describe all possible interactions. Give your health care provider a list of all the medicines, herbs, non-prescription drugs, or dietary supplements you use. Also tell them if you smoke, drink alcohol, or use illegal drugs. Some items may interact with your medicine. What should I watch for while using this medication? Visit your care team for regular checks on your progress. A test called the HbA1C (A1C) will be monitored. This is a simple blood test. It measures your blood sugar control over the last 2 to 3 months. You will receive this test every 3 to 6 months. Learn how to check your blood sugar. Learn the symptoms of low and high blood sugar and how to manage them. Always carry a quick-source of sugar with you in case you have symptoms of low blood sugar. Examples include hard sugar candy or glucose tablets. Make sure others know that you can choke if you eat or drink when you develop serious symptoms of low blood sugar, such as seizures or unconsciousness. They must get medical help at once. Tell your care team if you have high blood sugar. You might need to change the dose of your medication.  If you are sick or exercising more than usual, you might need to change the dose of your medication. Do  not skip meals. Ask your care team if you should avoid alcohol. Many nonprescription cough and cold products contain sugar or alcohol. These can affect blood sugar. This medication can make you more sensitive to the sun. Keep out of the sun. If you cannot avoid being in the sun, wear protective clothing and use sunscreen. Do not use sun lamps or tanning beds/booths. Wear a medical ID bracelet or chain, and carry a card that describes your disease and details of your medication and dosage times. What side effects may I notice from receiving this medication? Side effects that you should report to your care team as soon as possible: Allergic reactions--skin rash, itching, hives, swelling of the face, lips, tongue, or throat Low blood sugar (hypoglycemia)--tremors or shaking, anxiety, sweating, cold or clammy skin, confusion, dizziness, rapid heartbeat Side effects that usually do not require medical attention (report to your care team if they continue or are bothersome): Dizziness Headache Nausea This list may not describe all possible side effects. Call your doctor for medical advice about side effects. You may report side effects to FDA at 1-800-FDA-1088. Where should I keep my medication? Keep out of the reach of children and pets. Store at room temperature below 30 degrees C (86 degrees F). Throw away any unused medication after the expiration date. NOTE: This sheet is a summary. It may not cover all possible information. If you have questions about this medicine, talk to your doctor, pharmacist, or health care provider.  2024 Elsevier/Gold Standard (2021-08-30 00:00:00)

## 2023-11-12 NOTE — Addendum Note (Signed)
 Addended by: Kendi Defalco, IRAQ on: 11/12/2023 12:00 PM   Modules accepted: Orders

## 2023-11-12 NOTE — Progress Notes (Addendum)
 Outpatient Endocrinology Note Lori Shaine Mount, MD   Patient's Name: Lori [name]    DOB: 1970/08/30    MRN: [id number]                                                    REASON OF VISIT: Follow up for type 2 diabetes mellitus / hyperthyroidism   REFERRING PROVIDER: Ngetich, [name], NP  PCP: Ngetich, [name], NP  HISTORY OF PRESENT ILLNESS:   Lori [name] is a 53 y.o. old female with past medical history listed below, is here for follow-up for type 2 diabetes mellitus.  History of pheochromocytoma.  Hyperthyroidism.  Pertinent Diabetes History: Patient is referred to endocrinology for evaluation and management of type 2 diabetes mellitus and hyperthyroidism.  Initial consult in Jul 01, 2023.  Limited history from patient and patient's husband in the clinic.  Medical records reviewed from the chart and also from Care Everywhere.  Patient was known to have type 2 diabetes mellitus uncontrolled prior to 2019, she had follow-up with Soni Srivastav, MD [place], [place] , at that time she was on relatively high dose of basal and bolus insulin  regimen.  Patient denies taking non-insulin  diabetic oral medications in the past.  Based on prior records patient has history of multiple DKA.  There are also concern of possible type 1 diabetes mellitus, no records available to review. Patient was hospitalized due to diabetes ketoacidosis in beginning of April 2025.  Patient had negative type 1 diabetes autoimmune panel, GAD 65, IA 2 and zinc  transporter 8 antibodies.  Family h/o diabetes mellitus: Mother had type 2 diabetes mellitus.  She has uncontrolled type 2 diabetes mellitus with hemoglobin A1c in the range of 8.5 to 10% range.  Hemoglobin A1c in March 2025 was 10%.  Chronic Diabetes Complications : Retinopathy: Unknown. Last ophthalmology exam was done on ? Due.  Nephropathy: unknown, on ACE/ARB / olmesartan  Peripheral neuropathy: ? Coronary  artery disease: yes s/p CABG Stroke: yes, ? two times including in 2022, left hemiparesis She has gastroparesis.  Relevant comorbidities and cardiovascular risk factors: Obesity: no Body mass index is 31.17 kg/m.  Hypertension: Yes  Hyperlipidemia : Yes, on statin   Current / Home Diabetic regimen includes:  Lantus  18 - 20 units at bedtime. Humalog  10 - 15 units upto 3 times a day with meals.  Taking after eating.  Prior diabetic medications: Ozempic  in the past, was stopped due to nausea and pain abdomen, started in August 2024.  Glycemic data:    CONTINUOUS GLUCOSE MONITORING SYSTEM (CGMS) INTERPRETATION:    FreeStyle Libre CGM-  Sensor Download (Sensor download was reviewed and summarized below.) Dates: September 5 to October 23, 2023, 14 days Sensor Average: 202 Glucose Management Indicator: 8.1%  % data captured:62%    Impression: Frequent postprandial hyperglycemia with blood sugar up to 300s range related to high carb meal especially with lunch and evening meal.  Occasional hypoglycemia with blood sugar in 60s in between the meals.  No concerning hypoglycemia.  Hypoglycemia: Patient has minor hypoglycemic episodes. Patient has hypoglycemia awareness  Factors modifying glucose control: 1.  Diabetic diet assessment: 3 meals a day.  2.  Staying active or exercising: Not able to exercise.  3.  Medication compliance: compliant all of the time.  # Hyperthyroidism : - Patient was diagnosed with hyperthyroidism  based on labs while she was hospitalized in December 2022 with mildly low TSH, mildly elevated free T4 and started on methimazole  5 mg daily in December 2022.  Based on hospital note at that time she did not have prior history of thyroid  disorder, not on thyroid  medication.  She had CT with IV iodine contrast at that time in January 19, 2021 however they were done after thyroid  lab on the same day.  CT neck with unremarkable thyroid .  She did not have ultrasound  thyroid  in the past.  She had tachycardia at the time of diagnosis of hyperthyroidism and was started on beta-blocker metoprolol  as well.  No thyroid  autoantibodies results available to review.   Latest Reference Range & Units 01/19/21 07:43 01/20/21 07:09  TSH 0.350 - 4.500 uIU/mL 0.257 (L)   Triiodothyronine,Free,Serum 2.0 - 4.4 pg/mL  2.7  T4,Free(Direct) 0.61 - 1.12 ng/dL  8.83 (H)  (L): Data is abnormally low (H): Data is abnormally high  She had normal thyroid  function test on methimazole , mostly on methimazole  5 mg daily however was increased up to 10 mg daily in the past when free T4 was elevated.  Lately patient has been taking methimazole  5 mg daily.  # H/o right sided pheochromocytoma s/p resection , ? 2018, patient reports was done in [place] .  While she was following with endocrinology in [place]  plasma metanephrine was monitor in 2019.  No results available to review.  CT abdomen in Jun 15, 2023 showed absent right adrenal gland.  Normal left adrenal gland.  Interval history  Freestyle libre data as reviewed above.  She is mostly having postprandial hyperglycemia.  She has been taking mealtime insulin  after eating.  She reports she had hemoglobin A1c checked 1 week ago by visiting nurse and was 10.5%.  She has been taking methimazole  5 mg daily.  Denies hypo and hyperthyroid symptoms.  She is accompanied by her husband in the clinic today.  She had negative autoantibodies for type 1 diabetes mellitus at last visit.  Discussed about different non-insulin  medications, sulfonylurea, metformin or GLP-1 receptor agonist.  She does not want to add more medicine.  She is mainly worried about side effects and polypharmacy.  She has gastroparesis and she does not want to take Ozempic  that may worsen her gastroparesis.  REVIEW OF SYSTEMS As per history of present illness.   PAST MEDICAL HISTORY: Past Medical History:  Diagnosis Date   Acute bronchitis 08/24/2021   Acute  cystitis 12/26/2022   Acute metabolic encephalopathy 11/04/2022   Acute renal failure (ARF) 04/27/2023   AKI (acute kidney injury) 04/30/2022   Aspiration pneumonia (HCC) 10/13/2022   CAD (coronary artery disease)    Cerebral infarction, unspecified (HCC) 09/27/2012   Chest pain of uncertain etiology 10/06/2022   Depression    Diabetes mellitus without complication (HCC)    Diabetic hyperosmolar non-ketotic state (HCC) 01/19/2023   DKA (diabetic ketoacidosis) (HCC) 02/19/2021   History of CT scan    History of mammogram    History of MRI    Hypertension    Pheochromocytoma    Reversible cerebrovascular vasoconstriction syndrome    Spell of abnormal behavior    Stroke (HCC)    Thyroid  disease     PAST SURGICAL HISTORY: Past Surgical History:  Procedure Laterality Date   ABDOMINAL HYSTERECTOMY     BIOPSY  02/06/2023   Procedure: BIOPSY;  Surgeon: Leigh Elspeth SQUIBB, MD;  Location: [facility] ENDOSCOPY;  Service: Gastroenterology;;   CESAREAN SECTION  3   CORONARY ARTERY BYPASS GRAFT     ESOPHAGOGASTRODUODENOSCOPY (EGD) WITH PROPOFOL  N/A 02/06/2023   Procedure: ESOPHAGOGASTRODUODENOSCOPY (EGD) WITH PROPOFOL ;  Surgeon: Leigh Elspeth SQUIBB, MD;  Location: [facility] ENDOSCOPY;  Service: Gastroenterology;  Laterality: N/A;   Right adrenal gland removal for pheochromocytoma Right     ALLERGIES: Allergies  Allergen Reactions   Dorethia Senters ] Anaphylaxis and Swelling   Contrast Media [Iodinated Contrast Media] Anaphylaxis and Swelling   Imitrex [Sumatriptan] Anaphylaxis   Ms Contin  [Morphine ] Anaphylaxis and Swelling   Penicillins Swelling and Other (See Comments)    Mouth and eyes swell   Chlorhexidine  Gluconate [Chlorhexidine ] Itching and Rash   Firvanq  [Vancomycin ] Other (See Comments)    Red Man's Syndrome   Cipro  [Ciprofloxacin  Hcl] Nausea And Vomiting   Nitrofuran Derivatives Anxiety   Wound Dressing Adhesive Itching and Rash    FAMILY HISTORY:  Family History  Problem Relation  Age of Onset   Hypertension Mother    Diabetes Mother    Atrial fibrillation Mother    Hypertension Father    Heart attack Father    Dementia Father    Diabetes Brother    Brain cancer Brother    Asthma Daughter    Sickle cell anemia Son    Atrial fibrillation Maternal Uncle     SOCIAL HISTORY: Social History   Socioeconomic History   Marital status: Married    Spouse name: Not on file   Number of children: Not on file   Years of education: Not on file   Highest education level: Not on file  Occupational History   Not on file  Tobacco Use   Smoking status: Never   Smokeless tobacco: Never  Vaping Use   Vaping status: Never Used  Substance and Sexual Activity   Alcohol use: Not Currently   Drug use: Never   Sexual activity: Not on file  Other Topics Concern   Not on file  Social History Narrative   Tobacco use, amount per day now: Never   Past tobacco use, amount per day: Never   How many years did you use tobacco: Never   Alcohol use (drinks per week): Not Currently.   Diet: Eat out a lot.    Do you drink/eat things with caffeine : Sweet Tea   Marital status:   Married                               What year were you married? 2000   Do you live in a house, apartment, assisted living, condo, trailer, etc.? House   Is it one or more stories? 1   How many persons live in your home? 2   Do you have pets in your home?( please list) No   Highest Level of education completed? Bachelors Degree.   Current or past profession: Solicitor, [employer]   Do you exercise?   No                               Type and how often?   Do you have a living will? No   Do you have a DNR form?       No                            If not, do you  want to discuss one?   Do you have signed POA/HPOA forms?   No                     If so, please bring to you appointment      Do you have any difficulty bathing or dressing yourself? Yes   Do you have  any difficulty preparing food or eating? Yes   Do you have any difficulty managing your medications? Yes   Do you have any difficulty managing your finances? No   Do you have any difficulty affording your medications? Yes   Social Drivers of Corporate investment banker Strain: Not on file  Food Insecurity: No Food Insecurity (06/03/2023)   Hunger Vital Sign    Worried About Running Out of Food in the Last Year: Never true    Ran Out of Food in the Last Year: Never true  Transportation Needs: No Transportation Needs (06/03/2023)   PRAPARE - Administrator, Civil Service (Medical): No    Lack of Transportation (Non-Medical): No  Physical Activity: Not on file  Stress: Not on file  Social Connections: Not on file    MEDICATIONS:  Current Outpatient Medications  Medication Sig Dispense Refill   acetaminophen  (TYLENOL ) 325 MG tablet Take 2 tablets (650 mg total) by mouth every 6 (six) hours as needed for mild pain (pain score 1-3) (or Fever >/= 101).     albuterol  (VENTOLIN  HFA) 108 (90 Base) MCG/ACT inhaler INHALE 2 PUFFS BY MOUTH EVERY 6 HOURS AS NEEDED FOR WHEEZING AND SHORTNESS OF BREATH 6.7 g 5   atorvastatin  (LIPITOR ) 80 MG tablet Take 1 tablet (80 mg total) by mouth every evening. 80 tablet 2   baclofen  (LIORESAL ) 10 MG tablet Take 1 tablet (10 mg total) by mouth 3 (three) times daily. 90 tablet 1   clopidogrel  (PLAVIX ) 75 MG tablet TAKE 1 TABLET BY MOUTH DAILY 90 tablet 3   Continuous Glucose Sensor (FREESTYLE LIBRE 3 PLUS SENSOR) MISC 1 each by Does not apply route continuous. Change every 15 days. 6 each 3   Continuous Glucose Sensor (FREESTYLE LIBRE 3 PLUS SENSOR) MISC Change every 15 days     cyclobenzaprine  (FLEXERIL ) 5 MG tablet Take 1 tablet (5 mg total) by mouth 3 (three) times daily as needed for muscle spasms. 90 tablet 5   escitalopram  (LEXAPRO ) 20 MG tablet TAKE 1 TABLET BY MOUTH EVERY  MORNING 90 tablet 1   eszopiclone (LUNESTA) 2 MG TABS tablet Take 1 tablet  (2 mg total) by mouth at bedtime as needed for sleep. Take immediately before bedtime 30 tablet 1   ezetimibe  (ZETIA ) 10 MG tablet TAKE 1 TABLET BY MOUTH DAILY 90 tablet 3   gabapentin  (NEURONTIN ) 100 MG capsule Take 1 capsule (100 mg total) by mouth 3 (three) times daily. 90 capsule 1   HUMALOG  KWIKPEN 100 UNIT/ML KwikPen 10-15 units with meals 3 times a day, maximum 45 units/day. 30 mL 5   insulin  glargine (LANTUS  SOLOSTAR) 100 UNIT/ML Solostar Pen Inject 18 Units into the skin at bedtime. 15 mL 5   Insulin  Pen Needle 32G X 4 MM MISC Use 4x a day 300 each 3   methimazole  (TAPAZOLE ) 5 MG tablet Take 1 tablet (5 mg total) by mouth every morning. 90 tablet 3   metoCLOPramide  (REGLAN ) 10 MG tablet Take 1 tablet (10 mg total) by mouth every 6 (six) hours. 30 tablet 3   metoprolol  tartrate (LOPRESSOR ) 50 MG tablet  Take 1 tablet (50 mg total) by mouth 2 (two) times daily. 180 tablet 1   nystatin  (MYCOSTATIN ) 100000 UNIT/ML suspension Take 5 mLs (500,000 Units total) by mouth 4 (four) times daily. 60 mL 0   olmesartan -hydrochlorothiazide  (BENICAR  HCT) 40-25 MG tablet Take 1 tablet by mouth daily. 90 tablet 1   pantoprazole  (PROTONIX ) 40 MG tablet TAKE 1 TABLET BY MOUTH TWICE  DAILY 60 tablet 5   polyethylene glycol powder (GLYCOLAX /MIRALAX ) 17 GM/SCOOP powder Take 17 g by mouth daily. 3350 g 1   ranolazine  (RANEXA ) 500 MG 12 hr tablet Take 1 tablet (500 mg total) by mouth 2 (two) times daily. 180 tablet 0   senna-docusate (SENOKOT-S) 8.6-50 MG tablet Take 2 tablets by mouth 2 (two) times daily. 360 tablet 1   sucralfate  (CARAFATE ) 1 GM/10ML suspension SHAKE LIQUID AND TAKE 10 ML BY MOUTH AT BEDTIME 420 mL 11   tamsulosin  (FLOMAX ) 0.4 MG CAPS capsule TAKE 1 CAPSULE BY MOUTH AT BEDTIME 90 capsule 3   topiramate  (TOPAMAX ) 25 MG tablet TAKE 1 TABLET BY MOUTH TWICE DAILY 180 tablet 1   XARELTO  10 MG TABS tablet TAKE 1 TABLET BY MOUTH DAILY 100 tablet 2   No current facility-administered medications for this  visit.    PHYSICAL EXAM: Vitals:   11/12/23 1057  BP: 126/60  Pulse: (!) 128  Resp: 20  SpO2: 96%  Weight: 170 lb 6.4 oz (77.3 kg)  Height: 5' 2 (1.575 m)     Body mass index is 31.17 kg/m.  Wt Readings from Last 3 Encounters:  11/12/23 170 lb 6.4 oz (77.3 kg)  10/27/23 163 lb 6.4 oz (74.1 kg)  10/10/23 177 lb 12.8 oz (80.6 kg)    General: Well developed, well nourished female in no apparent distress.  HEENT: AT/[place], no external lesions.  Eyes: Conjunctiva clear and no icterus. Neck: Neck supple  Lungs: Respirations not labored Neurologic: Alert, oriented, ?  Aphasia.  Left-sided hemiparesis. Extremities / Skin: Dry.  Psychiatric: Does not appear depressed or anxious  Diabetic Foot Exam - Simple   No data filed    LABS Reviewed Lab Results  Component Value Date   HGBA1C 9.0 (A) 07/30/2023   HGBA1C 10.0 (H) 04/25/2023   HGBA1C 8.5 (H) 11/24/2022   No results found for: FRUCTOSAMINE Lab Results  Component Value Date   CHOL 158 10/27/2023   HDL 57 10/27/2023   LDLCALC 81 10/27/2023   TRIG 105 10/27/2023   CHOLHDL 2.8 10/27/2023   No results found for: MICRALBCREAT Lab Results  Component Value Date   CREATININE 1.28 (H) 10/27/2023   No results found for: GFR  ASSESSMENT / PLAN  1. Type 2 diabetes mellitus with other specified complication, with long-term current use of insulin  (HCC)   2. Hyperthyroidism   3. History of pheochromocytoma   4. Uncontrolled type 2 diabetes mellitus with hyperglycemia (HCC)      Latest Reference Range & Units 08/01/23 16:21  IA-2 Antibody <5.4 U/mL <5.4  ZNT8 Antibodies <15 U/mL <10  Glutamic Acid Decarb Ab <5 IU/mL <5    Diabetes Mellitus type 2, complicated by CVA, CAD - Diabetic status / severity: Uncontrolled.  Improving.  Lab Results  Component Value Date   HGBA1C 9.0 (A) 07/30/2023    - Hemoglobin A1c goal : <7%  Patient has uncontrolled type 2 diabetes mellitus.  Discussed about importance of  controlling blood sugar to prevent diabetic related complications including diabetic retinopathy, neuropathy and nephropathy.  She has history of stroke  with left-sided hemiparesis.  She has gastroparesis, she used to be on Ozempic  and then was stopped due to worsening nausea and vomiting.  Discussed about adding oral antidiabetic medication.  Consider glimepiride.  She wants to review side effect profile and let me know later.  She is not ready at this time to add more medications.  - Medications: See below.  I) continue Lantus  18 to 20 units daily.   II) adjust Humalog  10 to 15 units based on meal size, smaller meal 10 units and larger meal up to 15 units up to 3 times a day.  Advised to take Humalog  10 to 15 minutes before eating preferably.  She had negative type 1 diabetes autoimmune panel.  I would like to check C-peptide and random glucose today.  - Home glucose testing: CGM Freestyle libre.  - Discussed/ Gave Hypoglycemia treatment plan.  # Consult : not required at this time.   # Annual urine for microalbuminuria/ creatinine ratio, no microalbuminuria currently, continue ACE/ARB /olmesartan .  Will try to check in the future visit.  She uses diaper and has suprapubic catheter difficult to collect a sample.  Last No results found for: MICRALBCREAT  # Foot check nightly / ? neuropathy, continue gabapentin , managed by primary care provider.  # Annual dilated diabetic eye exams.  She needs diabetic eye exam.  - Diet: Make healthy diabetic food choices  2. Blood pressure  -  BP Readings from Last 1 Encounters:  11/12/23 126/60    - Control is in target.  - No change in current plans.  3. Lipid status / Hyperlipidemia - Last  Lab Results  Component Value Date   LDLCALC 81 10/27/2023   - Continue atorvastatin , ezetimibe , managed by primary care provider.  # Hyperthyroidism - Currently on methimazole  5 mg daily.  She was diagnosed with hyperthyroidism in December 2022  when hospitalized.  Recent thyroid  function test with normal TSH. - Etiology of hyperthyroidism is unclear at this time.  Will check TRAb today.  Thyroid  was unremarkable in the CT scan in the past. - Currently taking methimazole  5 mg daily. - Will check thyroid  function test today.  # History of pheochromocytoma, Status post right adrenalectomy in ? 2018. - She had normal left adrenal gland and absence of right adrenal gland on CT scan in May 2025. - Will monitor with annual plasma free metanephrines, as needed 24-hour urine metanephrines.  - Plasma free metanephrine normal in June 2025.  Diagnoses and all orders for this visit:  Type 2 diabetes mellitus with other specified complication, with long-term current use of insulin  (HCC) -     C-peptide -     Glucose, random  Hyperthyroidism -     T4, free -     T3, free -     TSH -     TRAb (TSH Receptor Binding Antibody)  History of pheochromocytoma  Uncontrolled type 2 diabetes mellitus with hyperglycemia (HCC)  Other orders -     Continuous Glucose Sensor (FREESTYLE LIBRE 3 PLUS SENSOR) MISC; Change every 15 days    DISPOSITION Follow up in clinic in 3 months suggested.  Labs today.   All questions answered and patient verbalized understanding of the plan.  Lori Buffey Zabinski, MD [facility] Endocrinology [facility] [address] [place], [address] [place], [place] [address] Phone # [phone]  At least part of this note was generated using voice recognition software. Inadvertent word errors may have occurred, which were not recognized during the proofreading  process.

## 2023-11-13 DIAGNOSIS — E079 Disorder of thyroid, unspecified: Secondary | ICD-10-CM | POA: Diagnosis not present

## 2023-11-13 DIAGNOSIS — I69354 Hemiplegia and hemiparesis following cerebral infarction affecting left non-dominant side: Secondary | ICD-10-CM | POA: Diagnosis not present

## 2023-11-13 DIAGNOSIS — I10 Essential (primary) hypertension: Secondary | ICD-10-CM | POA: Diagnosis not present

## 2023-11-13 DIAGNOSIS — E119 Type 2 diabetes mellitus without complications: Secondary | ICD-10-CM | POA: Diagnosis not present

## 2023-11-13 DIAGNOSIS — I2510 Atherosclerotic heart disease of native coronary artery without angina pectoris: Secondary | ICD-10-CM | POA: Diagnosis not present

## 2023-11-14 LAB — T4, FREE: Free T4: 1.4 ng/dL (ref 0.8–1.8)

## 2023-11-14 LAB — GLUCOSE, RANDOM: Glucose, Plasma: 241 mg/dL — ABNORMAL HIGH (ref 65–139)

## 2023-11-14 LAB — TRAB (TSH RECEPTOR BINDING ANTIBODY): TRAB: 4.27 IU/L — ABNORMAL HIGH (ref <=2.00)

## 2023-11-14 LAB — T3, FREE: T3, Free: 3.2 pg/mL (ref 2.3–4.2)

## 2023-11-14 LAB — C-PEPTIDE: C-Peptide: 2.95 ng/mL (ref 0.80–3.85)

## 2023-11-14 LAB — TSH: TSH: 0.26 m[IU]/L — ABNORMAL LOW

## 2023-11-17 ENCOUNTER — Ambulatory Visit: Admitting: Family

## 2023-11-17 ENCOUNTER — Encounter: Payer: Self-pay | Admitting: Family

## 2023-11-17 ENCOUNTER — Emergency Department (HOSPITAL_COMMUNITY)

## 2023-11-17 ENCOUNTER — Inpatient Hospital Stay (HOSPITAL_COMMUNITY)
Admission: EM | Admit: 2023-11-17 | Discharge: 2023-11-20 | DRG: 872 | Disposition: A | Discharge status: Home Health | Source: Ambulatory Visit | Attending: Internal Medicine | Admitting: Internal Medicine

## 2023-11-17 ENCOUNTER — Ambulatory Visit: Payer: Self-pay

## 2023-11-17 ENCOUNTER — Ambulatory Visit: Payer: Self-pay | Admitting: Endocrinology

## 2023-11-17 VITALS — BP 130/80 | HR 138 | Temp 97.8°F | Resp 19 | Ht 62.0 in | Wt 172.2 lb

## 2023-11-17 DIAGNOSIS — R000 Tachycardia, unspecified: Secondary | ICD-10-CM

## 2023-11-17 DIAGNOSIS — J45909 Unspecified asthma, uncomplicated: Secondary | ICD-10-CM | POA: Diagnosis present

## 2023-11-17 DIAGNOSIS — I69354 Hemiplegia and hemiparesis following cerebral infarction affecting left non-dominant side: Secondary | ICD-10-CM | POA: Diagnosis not present

## 2023-11-17 DIAGNOSIS — G43909 Migraine, unspecified, not intractable, without status migrainosus: Secondary | ICD-10-CM | POA: Diagnosis present

## 2023-11-17 DIAGNOSIS — E1165 Type 2 diabetes mellitus with hyperglycemia: Secondary | ICD-10-CM | POA: Diagnosis not present

## 2023-11-17 DIAGNOSIS — E66811 Obesity, class 1: Secondary | ICD-10-CM | POA: Diagnosis present

## 2023-11-17 DIAGNOSIS — Z79899 Other long term (current) drug therapy: Secondary | ICD-10-CM

## 2023-11-17 DIAGNOSIS — R350 Frequency of micturition: Secondary | ICD-10-CM

## 2023-11-17 DIAGNOSIS — Z86718 Personal history of other venous thrombosis and embolism: Secondary | ICD-10-CM | POA: Diagnosis not present

## 2023-11-17 DIAGNOSIS — I2510 Atherosclerotic heart disease of native coronary artery without angina pectoris: Secondary | ICD-10-CM | POA: Diagnosis present

## 2023-11-17 DIAGNOSIS — E1143 Type 2 diabetes mellitus with diabetic autonomic (poly)neuropathy: Secondary | ICD-10-CM | POA: Diagnosis not present

## 2023-11-17 DIAGNOSIS — Z6831 Body mass index (BMI) 31.0-31.9, adult: Secondary | ICD-10-CM | POA: Diagnosis not present

## 2023-11-17 DIAGNOSIS — R0682 Tachypnea, not elsewhere classified: Secondary | ICD-10-CM | POA: Diagnosis present

## 2023-11-17 DIAGNOSIS — Z833 Family history of diabetes mellitus: Secondary | ICD-10-CM

## 2023-11-17 DIAGNOSIS — I110 Hypertensive heart disease with heart failure: Secondary | ICD-10-CM | POA: Diagnosis not present

## 2023-11-17 DIAGNOSIS — Z794 Long term (current) use of insulin: Secondary | ICD-10-CM

## 2023-11-17 DIAGNOSIS — Z8249 Family history of ischemic heart disease and other diseases of the circulatory system: Secondary | ICD-10-CM | POA: Diagnosis not present

## 2023-11-17 DIAGNOSIS — R4701 Aphasia: Secondary | ICD-10-CM | POA: Diagnosis present

## 2023-11-17 DIAGNOSIS — R309 Painful micturition, unspecified: Secondary | ICD-10-CM | POA: Diagnosis not present

## 2023-11-17 DIAGNOSIS — Z951 Presence of aortocoronary bypass graft: Secondary | ICD-10-CM

## 2023-11-17 DIAGNOSIS — I10 Essential (primary) hypertension: Secondary | ICD-10-CM | POA: Diagnosis not present

## 2023-11-17 DIAGNOSIS — I69322 Dysarthria following cerebral infarction: Secondary | ICD-10-CM | POA: Diagnosis not present

## 2023-11-17 DIAGNOSIS — Z7901 Long term (current) use of anticoagulants: Secondary | ICD-10-CM | POA: Diagnosis not present

## 2023-11-17 DIAGNOSIS — E1169 Type 2 diabetes mellitus with other specified complication: Secondary | ICD-10-CM | POA: Diagnosis present

## 2023-11-17 DIAGNOSIS — N3000 Acute cystitis without hematuria: Secondary | ICD-10-CM | POA: Diagnosis present

## 2023-11-17 DIAGNOSIS — E7849 Other hyperlipidemia: Secondary | ICD-10-CM | POA: Diagnosis present

## 2023-11-17 DIAGNOSIS — Z7902 Long term (current) use of antithrombotics/antiplatelets: Secondary | ICD-10-CM

## 2023-11-17 DIAGNOSIS — E0590 Thyrotoxicosis, unspecified without thyrotoxic crisis or storm: Secondary | ICD-10-CM | POA: Diagnosis not present

## 2023-11-17 DIAGNOSIS — A419 Sepsis, unspecified organism: Secondary | ICD-10-CM | POA: Diagnosis not present

## 2023-11-17 DIAGNOSIS — I69398 Other sequelae of cerebral infarction: Secondary | ICD-10-CM

## 2023-11-17 DIAGNOSIS — Z993 Dependence on wheelchair: Secondary | ICD-10-CM

## 2023-11-17 DIAGNOSIS — Z832 Family history of diseases of the blood and blood-forming organs and certain disorders involving the immune mechanism: Secondary | ICD-10-CM

## 2023-11-17 DIAGNOSIS — Z808 Family history of malignant neoplasm of other organs or systems: Secondary | ICD-10-CM

## 2023-11-17 DIAGNOSIS — R0789 Other chest pain: Secondary | ICD-10-CM | POA: Diagnosis not present

## 2023-11-17 DIAGNOSIS — Z1152 Encounter for screening for COVID-19: Secondary | ICD-10-CM

## 2023-11-17 DIAGNOSIS — D649 Anemia, unspecified: Secondary | ICD-10-CM | POA: Diagnosis not present

## 2023-11-17 DIAGNOSIS — E7800 Pure hypercholesterolemia, unspecified: Secondary | ICD-10-CM | POA: Diagnosis not present

## 2023-11-17 DIAGNOSIS — R531 Weakness: Secondary | ICD-10-CM | POA: Diagnosis not present

## 2023-11-17 DIAGNOSIS — Z7401 Bed confinement status: Secondary | ICD-10-CM

## 2023-11-17 DIAGNOSIS — I6930 Unspecified sequelae of cerebral infarction: Secondary | ICD-10-CM

## 2023-11-17 DIAGNOSIS — I358 Other nonrheumatic aortic valve disorders: Secondary | ICD-10-CM | POA: Diagnosis not present

## 2023-11-17 DIAGNOSIS — I252 Old myocardial infarction: Secondary | ICD-10-CM

## 2023-11-17 DIAGNOSIS — R5381 Other malaise: Secondary | ICD-10-CM | POA: Diagnosis present

## 2023-11-17 DIAGNOSIS — E860 Dehydration: Secondary | ICD-10-CM | POA: Diagnosis not present

## 2023-11-17 DIAGNOSIS — G43019 Migraine without aura, intractable, without status migrainosus: Secondary | ICD-10-CM

## 2023-11-17 DIAGNOSIS — E785 Hyperlipidemia, unspecified: Secondary | ICD-10-CM | POA: Diagnosis not present

## 2023-11-17 DIAGNOSIS — K3184 Gastroparesis: Secondary | ICD-10-CM | POA: Diagnosis present

## 2023-11-17 DIAGNOSIS — R079 Chest pain, unspecified: Secondary | ICD-10-CM | POA: Diagnosis not present

## 2023-11-17 DIAGNOSIS — E119 Type 2 diabetes mellitus without complications: Secondary | ICD-10-CM

## 2023-11-17 DIAGNOSIS — I499 Cardiac arrhythmia, unspecified: Secondary | ICD-10-CM | POA: Diagnosis not present

## 2023-11-17 DIAGNOSIS — R471 Dysarthria and anarthria: Secondary | ICD-10-CM | POA: Diagnosis present

## 2023-11-17 DIAGNOSIS — E079 Disorder of thyroid, unspecified: Secondary | ICD-10-CM | POA: Diagnosis not present

## 2023-11-17 DIAGNOSIS — I5032 Chronic diastolic (congestive) heart failure: Secondary | ICD-10-CM | POA: Diagnosis not present

## 2023-11-17 DIAGNOSIS — Z825 Family history of asthma and other chronic lower respiratory diseases: Secondary | ICD-10-CM

## 2023-11-17 DIAGNOSIS — G4733 Obstructive sleep apnea (adult) (pediatric): Secondary | ICD-10-CM | POA: Diagnosis not present

## 2023-11-17 DIAGNOSIS — R002 Palpitations: Secondary | ICD-10-CM | POA: Diagnosis not present

## 2023-11-17 DIAGNOSIS — N390 Urinary tract infection, site not specified: Secondary | ICD-10-CM | POA: Diagnosis present

## 2023-11-17 DIAGNOSIS — Z90710 Acquired absence of both cervix and uterus: Secondary | ICD-10-CM

## 2023-11-17 LAB — CBC WITH DIFFERENTIAL/PLATELET
Abs Immature Granulocytes: 0.03 K/uL (ref 0.00–0.07)
Basophils Absolute: 0.1 K/uL (ref 0.0–0.1)
Basophils Relative: 1 %
Eosinophils Absolute: 0.3 K/uL (ref 0.0–0.5)
Eosinophils Relative: 4 %
HCT: 35.8 % — ABNORMAL LOW (ref 36.0–46.0)
Hemoglobin: 11.5 g/dL — ABNORMAL LOW (ref 12.0–15.0)
Immature Granulocytes: 0 %
Lymphocytes Relative: 44 %
Lymphs Abs: 3.6 K/uL (ref 0.7–4.0)
MCH: 25.1 pg — ABNORMAL LOW (ref 26.0–34.0)
MCHC: 32.1 g/dL (ref 30.0–36.0)
MCV: 78 fL — ABNORMAL LOW (ref 80.0–100.0)
Monocytes Absolute: 0.7 K/uL (ref 0.1–1.0)
Monocytes Relative: 9 %
Neutro Abs: 3.4 K/uL (ref 1.7–7.7)
Neutrophils Relative %: 42 %
Platelets: 351 K/uL (ref 150–400)
RBC: 4.59 MIL/uL (ref 3.87–5.11)
RDW: 16.8 % — ABNORMAL HIGH (ref 11.5–15.5)
WBC: 8 K/uL (ref 4.0–10.5)
nRBC: 0 % (ref 0.0–0.2)

## 2023-11-17 LAB — RESP PANEL BY RT-PCR (RSV, FLU A&B, COVID)  RVPGX2
Influenza A by PCR: NEGATIVE
Influenza B by PCR: NEGATIVE
Resp Syncytial Virus by PCR: NEGATIVE
SARS Coronavirus 2 by RT PCR: NEGATIVE

## 2023-11-17 LAB — PROTIME-INR
INR: 0.9 (ref 0.8–1.2)
Prothrombin Time: 13 s (ref 11.4–15.2)

## 2023-11-17 LAB — COMPREHENSIVE METABOLIC PANEL WITH GFR
ALT: 18 U/L (ref 0–44)
AST: 17 U/L (ref 15–41)
Albumin: 3.1 g/dL — ABNORMAL LOW (ref 3.5–5.0)
Alkaline Phosphatase: 85 U/L (ref 38–126)
Anion gap: 10 (ref 5–15)
BUN: 19 mg/dL (ref 6–20)
CO2: 19 mmol/L — ABNORMAL LOW (ref 22–32)
Calcium: 9.3 mg/dL (ref 8.9–10.3)
Chloride: 106 mmol/L (ref 98–111)
Creatinine, Ser: 0.87 mg/dL (ref 0.44–1.00)
GFR, Estimated: >60 mL/min (ref >60)
Glucose, Bld: 94 mg/dL (ref 70–99)
Potassium: 4.1 mmol/L (ref 3.5–5.1)
Sodium: 135 mmol/L (ref 135–145)
Total Bilirubin: 0.3 mg/dL (ref 0.0–1.2)
Total Protein: 6.6 g/dL (ref 6.5–8.1)

## 2023-11-17 LAB — URINALYSIS, W/ REFLEX TO CULTURE (INFECTION SUSPECTED)
Bilirubin Urine: NEGATIVE
Glucose, UA: NEGATIVE mg/dL
Ketones, ur: NEGATIVE mg/dL
Nitrite: POSITIVE — AB
Protein, ur: NEGATIVE mg/dL
Specific Gravity, Urine: 1.01 (ref 1.005–1.030)
pH: 5 (ref 5.0–8.0)

## 2023-11-17 LAB — HEMOGLOBIN A1C
Hgb A1c MFr Bld: 9.1 % — ABNORMAL HIGH (ref 4.8–5.6)
Mean Plasma Glucose: 214.47 mg/dL

## 2023-11-17 LAB — I-STAT CG4 LACTIC ACID, ED: Lactic Acid, Venous: 1.2 mmol/L (ref 0.5–1.9)

## 2023-11-17 LAB — TROPONIN I (HIGH SENSITIVITY): Troponin I (High Sensitivity): 4 ng/L (ref <18)

## 2023-11-17 LAB — RETICULOCYTES
Immature Retic Fract: 6.3 % (ref 2.3–15.9)
RBC.: 4.55 MIL/uL (ref 3.87–5.11)
Retic Count, Absolute: 30.5 K/uL (ref 19.0–186.0)
Retic Ct Pct: 0.7 % (ref 0.4–3.1)

## 2023-11-17 MED ORDER — SODIUM CHLORIDE 0.9 % IV SOLN
2.0000 g | Freq: Three times a day (TID) | INTRAVENOUS | Status: DC
  Administered 2023-11-18 – 2023-11-19 (×5): 2 g via INTRAVENOUS
  Filled 2023-11-17 (×5): qty 12.5

## 2023-11-17 MED ORDER — LACTATED RINGERS IV SOLN: INTRAVENOUS | Status: DC

## 2023-11-17 MED ORDER — INSULIN ASPART 100 UNIT/ML IJ SOLN
0.0000 [IU] | INTRAMUSCULAR | Status: DC
  Administered 2023-11-18: 2 [IU] via SUBCUTANEOUS
  Administered 2023-11-18: 3 [IU] via SUBCUTANEOUS
  Administered 2023-11-18 (×2): 5 [IU] via SUBCUTANEOUS
  Administered 2023-11-18: 1 [IU] via SUBCUTANEOUS
  Administered 2023-11-18 – 2023-11-19 (×2): 2 [IU] via SUBCUTANEOUS

## 2023-11-17 MED ORDER — SODIUM CHLORIDE 0.9 % IV SOLN
1.0000 g | Freq: Once | INTRAVENOUS | Status: AC
  Administered 2023-11-17: 1 g via INTRAVENOUS
  Filled 2023-11-17: qty 20

## 2023-11-17 MED ORDER — LACTATED RINGERS IV BOLUS (SEPSIS)
1000.0000 mL | Freq: Once | INTRAVENOUS | Status: AC
  Administered 2023-11-17: 1000 mL via INTRAVENOUS

## 2023-11-17 MED ORDER — SODIUM CHLORIDE 0.9 % IV SOLN
150.0000 mL/h | INTRAVENOUS | Status: DC
  Administered 2023-11-17 – 2023-11-18 (×2): 150 mL/h via INTRAVENOUS

## 2023-11-17 NOTE — Assessment & Plan Note (Signed)
 Continue Plavix and Xarelto

## 2023-11-17 NOTE — Assessment & Plan Note (Signed)
 Chronic stable continue   Zetia  10 mg daily

## 2023-11-17 NOTE — Assessment & Plan Note (Signed)
-  SIRS criteria met with       Component Value Date/Time   WBC 8.0 11/17/2023 1800   LYMPHSABS 3.6 11/17/2023 1800     tachycardia    RR >20 Today's Vitals   11/17/23 1628 11/17/23 1837 11/17/23 2017 11/17/23 2315  BP: 105/84 100/73  99/78  Pulse: (!) 133 (!) 103  99  Resp: 16 18  15   Temp: 97.9 F (36.6 C)  (!) 97.5 F (36.4 C)   TempSrc:   Oral   SpO2: 97% 100%  100%      The recent clinical data is shown below. Vitals:   11/17/23 1628 11/17/23 1837 11/17/23 2017 11/17/23 2315  BP: 105/84 100/73  99/78  Pulse: (!) 133 (!) 103  99  Resp: 16 18  15   Temp: 97.9 F (36.6 C)  (!) 97.5 F (36.4 C)   TempSrc:   Oral   SpO2: 97% 100%  100%       SIRS criteria were due to ( dehydration chronic recurrent tachycardia   -Most likely source being:  urinary,       - Obtain serial lactic acid and procalcitonin level.  - Initiated IV antibiotics in ER: Antibiotics Given (last 72 hours)     Date/Time Action Medication Dose Rate   11/17/23 1830 New Bag/Given   meropenem (MERREM) 1 g in sodium chloride  0.9 % 100 mL IVPB 1 g 200 mL/hr       Will continue  on : Cefepime    - await results of blood and urine culture  - Rehydrate aggressively  Intravenous fluids were administered,    11:43 PM

## 2023-11-17 NOTE — ED Notes (Signed)
 RN unable to draw labs from IV placed by IV team, PT states she dosent want to be stuck anymore for labs.

## 2023-11-17 NOTE — Assessment & Plan Note (Signed)
 Allow permissive hypertension except for metoprolol  restarted

## 2023-11-17 NOTE — Assessment & Plan Note (Signed)
 Chronic secondary to strokes

## 2023-11-17 NOTE — Assessment & Plan Note (Signed)
 Check TSH restart Tapazole  5 g a day and metoprolol  50 mg twice daily

## 2023-11-17 NOTE — Assessment & Plan Note (Signed)
 Recurrent in the setting of history of hyperthyroidism Check TSH T3-T4 continue Tapazole  and restart metoprolol  rehydrate

## 2023-11-17 NOTE — Assessment & Plan Note (Signed)
Currently appears to be slightly on the dry side we will gently rehydrate 

## 2023-11-17 NOTE — ED Triage Notes (Addendum)
 Pt [facility] from PCP for possible UTI. Urinary frequency, burning, weakness x1 week, palpitations, tachy.    Hx of stroke, Gen weakness BL since.     110/75 97% RA, RR 24, CBG 1130 ETC02 33, 97.8

## 2023-11-17 NOTE — Assessment & Plan Note (Signed)
 Continue Topamax

## 2023-11-17 NOTE — Assessment & Plan Note (Signed)
 Obtain anemia panel  Transfuse for Hg <7 , rapidly dropping or  if symptomatic

## 2023-11-17 NOTE — Assessment & Plan Note (Signed)
 Order sliding scale continue Lantus  but decrease dose down to 10 units

## 2023-11-17 NOTE — Sepsis Progress Note (Signed)
 Difficult IV stick.

## 2023-11-17 NOTE — Telephone Encounter (Signed)
 Usually HR 85-88 per PT at home with pt. FSBS 265 on arrival at 10:45 then did exercises then FSBS 310- Just took 2nd dose of insulin . FSBS 327 at 1157   [name] or Action Required?: [name] for provider.  Patient was last seen in primary care on 11/12/2023 by Ngetich, [name], NP.  Called Nurse Triage reporting Heart Problem.  Symptoms began today.  Interventions attempted: Other: took more insulin .  Symptoms are: unchanged.  Triage Disposition: See HCP Within 4 Hours (Or PCP Triage)  Patient/caregiver understands and will follow disposition?: Yes  Copied from CRM [id number]. Topic: Clinical - Red Word Triage >> Nov 17, 2023 11:39 AM Carrielelia G wrote: Red Word that prompted transfer to Nurse Triage:  resting heart rate 106 to 115... BS 310    Reason for Disposition  Fever > 100.4 F (38.0 C)  Fever > 100.4 F (38.0 C)  Answer Assessment - Initial Assessment Questions 1. DESCRIPTION: Please describe your heart rate or heartbeat that you are having (e.g., fast/slow, regular/irregular, skipped or extra beats, palpitations)     HR 106 to 115 2. ONSET: When did it start? (e.g., minutes, hours, days)      today 3. DURATION: How long does it last (e.g., seconds, minutes, hours)     na 4. PATTERN Does it come and go, or has it been constant since it started?  Does it get worse with exertion?   Are you feeling it now?     constant 5. TAP: Using your hand, can you tap out what you are feeling on a chair or table in front of you, so that I can hear? Note: Not all patients can do this.       na 6. HEART RATE: Can you tell me your heart rate? How many beats in 15 seconds?  Note: Not all patients can do this.       na 7. RECURRENT SYMPTOM: Have you ever had this before? If Yes, ask: When was the last time? and What happened that time?      na 8. CAUSE: What do you think is causing the palpitations?     unknown 9. CARDIAC HISTORY: Do you have any history  of heart disease? (e.g., heart attack, angina, bypass surgery, angioplasty, arrhythmia)      na 10. OTHER SYMPTOMS: Do you have any other symptoms? (e.g., dizziness, chest pain, sweating, difficulty breathing)       Sleepy, no SOB,  11. PREGNANCY: Is there any chance you are pregnant? When was your last menstrual period?       Na   Pt states hyperthyroidism  Answer Assessment - Initial Assessment Questions 1. SYMPTOM: What's the main symptom you're concerned about? (e.g., frequency, incontinence)     Burning with urination,  2. ONSET: When did the  start?     week 3. PAIN: Is there any pain? If Yes, ask: How bad is it? (Scale: 1-10; mild, moderate, severe)     Mild  4. CAUSE: What do you think is causing the symptoms?     unknown 5. OTHER SYMPTOMS: Do you have any other symptoms? (e.g., blood in urine, fever, flank pain, pain with urination)   weakness 6. PREGNANCY: Is there any chance you are pregnant? When was your last menstrual period?     na  Answer Assessment - Initial Assessment Questions 1. BLOOD GLUCOSE: What is your blood glucose level?      310 2. ONSET: When did you check the  blood glucose?     Today  3. USUAL RANGE: What is your glucose level usually? (e.g., usual fasting morning value, usual evening value)     na 4. KETONES: Do you check for ketones (urine or blood test strips)? If Yes, ask: What does the test show now?      unsure 5. TYPE 1 or 2:  Do you know what type of diabetes you have?  (e.g., Type 1, Type 2, Gestational; doesn't know)      unsure 6. INSULIN : Do you take insulin ? What type of insulin (s) do you use? What is the mode of delivery? (syringe, pen; injection or pump)?      yes 7. DIABETES PILLS: Do you take any pills for your diabetes? If Yes, ask: Have you missed taking any pills recently?     unsure 8. OTHER SYMPTOMS: Do you have any symptoms? (e.g., fever, frequent urination, difficulty breathing,  dizziness, weakness, vomiting)     Frequent urination, weakness 9. PREGNANCY: Is there any chance you are pregnant? When was your last menstrual period?     na  Protocols used: Heart Rate and Heartbeat Questions-A-AH, Urinary Symptoms-A-AH, Diabetes - High Blood Sugar-A-AH

## 2023-11-17 NOTE — ED Notes (Signed)
 Phlebotomy unable to draw labs, PT states she doesn't want to be stuck anymore for labs.

## 2023-11-17 NOTE — Subjective & Objective (Signed)
 Patient presents with possible UTI frequent urination and burning weakness for the past 1 week open has been having palpitations past history of stroke on arrival blood pressure 110/75 satting 97% on room air Also endorses having chest pain today found to be tachycardic up to 140s he was found to be sinus tachycardia she is on Xarelto  but was sent to emergency department she is not short of breath not febrile

## 2023-11-17 NOTE — Assessment & Plan Note (Signed)
 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

## 2023-11-17 NOTE — Telephone Encounter (Signed)
 Message routed to PCP Ngetich, [name], NP and medical assistant Demetria.W/CMA as RICK. Patient placed on schedule for today.

## 2023-11-17 NOTE — ED Notes (Signed)
 Brief changed & pt repositioned in bed

## 2023-11-17 NOTE — Assessment & Plan Note (Signed)
-   treat with cefepime  adjust coverage as needed pending of urine culture Discussed with pharmacy

## 2023-11-17 NOTE — Assessment & Plan Note (Signed)
 Contributing to comorbidity and complicating medical management     Nutritional follow up as an out pt would be recommended

## 2023-11-17 NOTE — Assessment & Plan Note (Addendum)
 Continue to cycle cardiac enzymes of unremarkable.  Monitor EKG monitor on telemetry currently chest pain-free Patient reports she has intermittent chest pains she also does have history of CAD. Repeat echogram if abnormal may need further cardiology involvement patient has multifactorial causes for chest pain including GERD

## 2023-11-17 NOTE — Progress Notes (Signed)
 PIV consult: RUE assessed with US . Pt has limited options for PIV. Please consider PICC or central line if she will require prolonged venous access.

## 2023-11-17 NOTE — ED Notes (Signed)
 2nd Lact due @ 2026, Extra Red, SST & DG drawn

## 2023-11-17 NOTE — Assessment & Plan Note (Signed)
 Patient explains to me that she is on Xarelto  for history of multiple strokes

## 2023-11-17 NOTE — Sepsis Progress Note (Signed)
 Elink monitoring for the code sepsis protocol.

## 2023-11-17 NOTE — Progress Notes (Signed)
 Provider: Roxan Plough FNP-C  Nixie Laube, [name], NP  Patient Care Team: Talena Neira C, NP as PCP - General (Family Medicine) Thukkani, Arun K, MD as PCP - Cardiology (Cardiology) [facility] Thapa, [place], MD as Consulting Physician (Endocrinology)  Extended Emergency Contact Information Primary Emergency Contact: [name],[name] Mobile Phone: [phone] Relation: Spouse Preferred language: English Interpreter needed? No Secondary Emergency Contact: [name],[name] Home Phone: [phone] Mobile Phone: [phone] Relation: Daughter Mother: [facility] Phone: [phone] Mobile Phone: [phone]  Code Status:  Full Code  Goals of care: Advanced Directive information    10/27/2023    1:21 PM  Advanced Directives  Does Patient Have a Medical Advance Directive? No  Would patient like information on creating a medical advance directive? No - Patient declined     Chief Complaint  Patient presents with   Urine Frequency    Increase Heart Rate.    Discussed the use of AI scribe software for clinical note transcription with the patient, who gave verbal consent to proceed.  History of Present Illness   Lori [name] is a 53 year old female who presents with tachycardia, urine Frequency and elevated blood sugar levels. She is accompanied by her husband.  She has been experiencing tachycardia, with a current heart rate of 138 beats per minute. A previous EKG showed a heart rate of 128 beats per minute. She has a history of seeing a cardiologist. No palpitation, shortness of breath or chest pain. She was here on 11/12/2023 with complains of urine frequency but was unable to provide urine specimen. She was sent home with urine cup then POA to drop specimen back to the office. Still unable to provide specimen. Uses Pure Wick    Her blood sugar level spiked to 350 mg/dL after consuming chocolate milk this morning, requiring  additional insulin  to lower it. She is concerned about the relationship between her elevated heart rate and blood sugar levels.  She is currently taking gabapentin  100 mg and Lunesta (eszopiclone) 2 mg at bedtime. She previously experienced confusion and irritability with gabapentin , but these symptoms have resolved. She no longer takes Lorazepam , which she used alongside gabapentin .  She experiences generalized itching without a rash.    Past Medical History:  Diagnosis Date   Acute bronchitis 08/24/2021   Acute cystitis 12/26/2022   Acute metabolic encephalopathy 11/04/2022   Acute renal failure (ARF) 04/27/2023   AKI (acute kidney injury) 04/30/2022   Aspiration pneumonia (HCC) 10/13/2022   CAD (coronary artery disease)    Cerebral infarction, unspecified (HCC) 09/27/2012   Chest pain of uncertain etiology 10/06/2022   Depression    Diabetes mellitus without complication (HCC)    Diabetic hyperosmolar non-ketotic state (HCC) 01/19/2023   DKA (diabetic ketoacidosis) (HCC) 02/19/2021   History of CT scan    History of mammogram    History of MRI    Hypertension    Pheochromocytoma    Reversible cerebrovascular vasoconstriction syndrome    Spell of abnormal behavior    Stroke [facility])    Thyroid  disease    Past Surgical History:  Procedure Laterality Date   ABDOMINAL HYSTERECTOMY     BIOPSY  02/06/2023   Procedure: BIOPSY;  Surgeon: Leigh Elspeth SQUIBB, MD;  Location: [facility] ENDOSCOPY;  Service: Gastroenterology;;   CESAREAN SECTION     3   CORONARY ARTERY BYPASS GRAFT     ESOPHAGOGASTRODUODENOSCOPY (EGD) WITH PROPOFOL  N/A 02/06/2023   Procedure: ESOPHAGOGASTRODUODENOSCOPY (EGD) WITH PROPOFOL ;  Surgeon: Leigh Elspeth SQUIBB, MD;  Location:  [facility] ENDOSCOPY;  Service: Gastroenterology;  Laterality: N/A;   Right adrenal gland removal for pheochromocytoma Right     Allergies  Allergen Reactions   Dorethia Furnace ] Anaphylaxis and Swelling   Contrast Media [Iodinated Contrast Media]  Anaphylaxis and Swelling   Imitrex [Sumatriptan] Anaphylaxis   Ms Contin  [Morphine ] Anaphylaxis and Swelling   Penicillins Swelling and Other (See Comments)    Mouth and eyes swell   Chlorhexidine  Gluconate [Chlorhexidine ] Itching and Rash   Firvanq  [Vancomycin ] Other (See Comments)    Red Man's Syndrome   Cipro  [Ciprofloxacin  Hcl] Nausea And Vomiting   Nitrofuran Derivatives Anxiety   Wound Dressing Adhesive Itching and Rash    Outpatient Encounter Medications as of 11/17/2023  Medication Sig   acetaminophen  (TYLENOL ) 325 MG tablet Take 2 tablets (650 mg total) by mouth every 6 (six) hours as needed for mild pain (pain score 1-3) (or Fever >/= 101).   albuterol  (VENTOLIN  HFA) 108 (90 Base) MCG/ACT inhaler INHALE 2 PUFFS BY MOUTH EVERY 6 HOURS AS NEEDED FOR WHEEZING AND SHORTNESS OF BREATH   atorvastatin  (LIPITOR ) 80 MG tablet Take 1 tablet (80 mg total) by mouth every evening.   baclofen  (LIORESAL ) 10 MG tablet Take 1 tablet (10 mg total) by mouth 3 (three) times daily.   clopidogrel  (PLAVIX ) 75 MG tablet TAKE 1 TABLET BY MOUTH DAILY   Continuous Glucose Sensor (FREESTYLE LIBRE 3 PLUS SENSOR) MISC 1 each by Does not apply route continuous. Change every 15 days.   Continuous Glucose Sensor (FREESTYLE LIBRE 3 PLUS SENSOR) MISC Change every 15 days   cyclobenzaprine  (FLEXERIL ) 5 MG tablet Take 1 tablet (5 mg total) by mouth 3 (three) times daily as needed for muscle spasms.   escitalopram  (LEXAPRO ) 20 MG tablet TAKE 1 TABLET BY MOUTH EVERY  MORNING   eszopiclone (LUNESTA) 2 MG TABS tablet Take 1 tablet (2 mg total) by mouth at bedtime as needed for sleep. Take immediately before bedtime   ezetimibe  (ZETIA ) 10 MG tablet TAKE 1 TABLET BY MOUTH DAILY   fluconazole  (DIFLUCAN ) 150 MG tablet Take one tablet by mouth x 1 dose then repeat in one week   gabapentin  (NEURONTIN ) 100 MG capsule Take 1 capsule (100 mg total) by mouth 3 (three) times daily.   HUMALOG  KWIKPEN 100 UNIT/ML KwikPen 10-15  units with meals 3 times a day, maximum 45 units/day.   insulin  glargine (LANTUS  SOLOSTAR) 100 UNIT/ML Solostar Pen Inject 18 Units into the skin at bedtime.   Insulin  Pen Needle 32G X 4 MM MISC Use 4x a day   methimazole  (TAPAZOLE ) 5 MG tablet Take 1 tablet (5 mg total) by mouth every morning.   metoCLOPramide  (REGLAN ) 10 MG tablet Take 1 tablet (10 mg total) by mouth every 6 (six) hours.   metoprolol  tartrate (LOPRESSOR ) 50 MG tablet Take 1 tablet (50 mg total) by mouth 2 (two) times daily.   nystatin  (MYCOSTATIN ) 100000 UNIT/ML suspension Take 5 mLs (500,000 Units total) by mouth 4 (four) times daily.   nystatin  cream (MYCOSTATIN ) Apply 1 Application topically 2 (two) times daily.   olmesartan -hydrochlorothiazide  (BENICAR  HCT) 40-25 MG tablet Take 1 tablet by mouth daily.   pantoprazole  (PROTONIX ) 40 MG tablet TAKE 1 TABLET BY MOUTH TWICE  DAILY   polyethylene glycol powder (GLYCOLAX /MIRALAX ) 17 GM/SCOOP powder Take 17 g by mouth daily.   senna-docusate (SENOKOT-S) 8.6-50 MG tablet Take 2 tablets by mouth 2 (two) times daily.   sucralfate  (CARAFATE ) 1 GM/10ML suspension SHAKE LIQUID AND TAKE 10 ML BY MOUTH  AT BEDTIME   tamsulosin  (FLOMAX ) 0.4 MG CAPS capsule TAKE 1 CAPSULE BY MOUTH AT BEDTIME   topiramate  (TOPAMAX ) 25 MG tablet TAKE 1 TABLET BY MOUTH TWICE DAILY   XARELTO  10 MG TABS tablet TAKE 1 TABLET BY MOUTH DAILY   ranolazine  (RANEXA ) 500 MG 12 hr tablet Take 1 tablet (500 mg total) by mouth 2 (two) times daily. (Patient not taking: Reported on 11/17/2023)   No facility-administered encounter medications on file as of 11/17/2023.    Review of Systems  Constitutional:  Negative for appetite change, chills, fatigue, fever and unexpected weight change.  HENT:  Negative for congestion, ear discharge, ear pain, hearing loss, nosebleeds, rhinorrhea, sinus pressure, sinus pain, sneezing, sore throat, tinnitus and trouble swallowing.   Eyes:  Negative for pain, discharge, redness, itching and  visual disturbance.  Respiratory:  Negative for cough, chest tightness, shortness of breath and wheezing.   Cardiovascular:  Negative for chest pain, palpitations and leg swelling.  Gastrointestinal:  Negative for abdominal distention, abdominal pain, blood in stool, constipation, diarrhea, nausea and vomiting.  Endocrine: Negative for cold intolerance, heat intolerance, polydipsia, polyphagia and polyuria.  Genitourinary:  Positive for frequency. Negative for difficulty urinating, dysuria, flank pain and urgency.       Incontinency   Musculoskeletal:  Positive for gait problem. Negative for arthralgias, back pain, joint swelling, myalgias, neck pain and neck stiffness.  Skin:  Negative for color change, pallor, rash and wound.       Generalized itching   Neurological:  Negative for dizziness, syncope, light-headedness, numbness and headaches.       Left side weakness   Hematological:  Does not bruise/bleed easily.  Psychiatric/Behavioral:  Negative for agitation, behavioral problems, confusion, hallucinations and sleep disturbance. The patient is not nervous/anxious.     Immunization History  Administered Date(s) Administered   Influenza-Unspecified 11/04/2016   PPD Test 03/19/2016   Pfizer(Comirnaty)Fall Seasonal Vaccine 12 years and older 09/29/2019, 03/31/2020   Tdap 02/07/2014   Pertinent  Health Maintenance Due  Topic Date Due   FOOT EXAM  Never done   Mammogram  Never done   Colonoscopy  Never done   Influenza Vaccine  06/15/2024 (Originally 09/05/2023)   HEMOGLOBIN A1C  01/29/2024   OPHTHALMOLOGY EXAM  02/19/2024      06/13/2023   10:06 AM 07/29/2023   10:40 AM 10/10/2023    9:31 AM 10/27/2023    1:21 PM 10/28/2023    3:21 PM  Fall Risk  Falls in the past year? 0 Exclusion - non ambulatory 0 0 0  Was there an injury with Fall? 0   0 0  Fall Risk Category Calculator 0   0 0  Patient at Risk for Falls Due to   Impaired balance/gait;Impaired mobility No Fall Risks   Fall risk  Follow up    Falls evaluation completed    Functional Status Survey:    Vitals:   11/17/23 1445  BP: 130/80  Pulse: (!) 138  Resp: 19  Temp: 97.8 F (36.6 C)  SpO2: 98%  Weight: 172 lb 3.2 oz (78.1 kg)  Height: 5' 2 (1.575 m)   Body mass index is 31.5 kg/m. Physical Exam Physical Exam   VITALS: P- 138 GENERAL: Alert, cooperative, well developed, no acute distress. HEENT: Normocephalic, normal oropharynx, moist mucous membranes. CHEST: Clear to auscultation bilaterally, no wheezes, rhonchi, or crackles. CARDIOVASCULAR: Tachycardia  ABDOMEN: Soft, non-tender, non-distended, without organomegaly, normal bowel sounds. EXTREMITIES: No cyanosis or edema. NEUROLOGICAL: Cranial nerves grossly intact,  left hand contracture and left side weakness  SKIN: No rash,no lesion or erythema   PSYCHIATRY/BEHAVIORAL: Mood stable    Labs reviewed: Recent Labs    12/02/22 0530 12/05/22 0500 01/02/23 0440 01/03/23 1104 04/29/23 0519 04/30/23 0520 05/11/23 0851 05/12/23 0529 05/13/23 0453 05/15/23 1126 06/15/23 0744 08/01/23 1621 10/27/23 1407  NA 136   < > 130*   < > 144   < > 135   < > 131*   < > 140 136 136  K 4.0   < > 3.3*   < > 3.8   < > 2.9*   < > 3.2*   < > 3.4* 4.3 4.0  CL 105   < > 97*   < > 110   < > 102   < > 102   < > 107 105 101  CO2 23   < > 20*   < > 25   < > 22   < > 23   < > 21* 19* 24  GLUCOSE 196*   < > 235*   < > 103*   < > 204*   < > 180*   < > 182* 254* 138  BUN 20   < > 8   < > 25*   < > 10   < > 10   < > 6 15 29*  CREATININE 0.94   < > 0.77   < > 1.46*   < > 0.72   < > 0.80   < > 0.78 0.72 1.28*  CALCIUM  8.9   < > 8.5*   < > 9.1   < > 8.4*   < > 7.9*   < > 9.4 9.4 9.2  MG 1.7  --  1.7   < > 1.7  --  1.4*  --  1.7  --   --   --   --   PHOS 4.1  --  3.3  --  3.5  --   --   --   --   --   --   --   --    < > = values in this interval not displayed.   Recent Labs    06/09/23 0304 06/10/23 0410 06/15/23 0744 10/27/23 1407  AST 11* 11* 14* 14  ALT 11 11  11 15   ALKPHOS 57 56 60  --   BILITOT 0.5 0.4 0.6 0.4  PROT 5.5* 5.4* 6.9 7.6  ALBUMIN  2.4* 2.3* 3.3*  --    Recent Labs    06/15/23 0744 06/27/23 1459 10/27/23 1407  WBC 14.3* 7.7 8.8  NEUTROABS 11.0* 4,397 5,166  HGB 12.2 13.1 13.5  HCT 36.6 43.2 43.6  MCV 78.7* 87.1 80.9  PLT 640* 443* 429*   Lab Results  Component Value Date   TSH 0.26 (L) 11/12/2023   Lab Results  Component Value Date   HGBA1C 9.0 (A) 07/30/2023   Lab Results  Component Value Date   CHOL 158 10/27/2023   HDL 57 10/27/2023   LDLCALC 81 10/27/2023   TRIG 105 10/27/2023   CHOLHDL 2.8 10/27/2023    Significant Diagnostic Results in last 30 days:  No results found.  Assessment/Plan  Sinus tachycardia with left atrial enlargement Sinus tachycardia with a heart rate of 138 bpm, previously 128 bpm. EKG showed sinus tachycardia with left atrial enlargement. High heart rate poses a risk for thromboembolic events. - Call ambulance for transport to hospital for evaluation and management of tachycardia. - Monitor  heart rate with EKG during transport. - will need follow up with cardiologist after stabilization. - Sent to ED for further evaluation   Type 2 Diabetes Mellitus  Blood sugar level of 350 mg/dL after consuming chocolate milk. Requires insulin  adjustment to manage elevated blood glucose levels. - continue on insulin  to lower blood glucose levels.  Pruritus Generalized pruritus without rash. Possible side effect of current medications, including gabapentin  and Lunesta. - Recommend discontinuing Lunesta, but she declined.  Possible urinary tract infection Urine is clear, but there is a plan to provide a urine sample for further evaluation. Possible urinary tract infection contributing to symptoms. - Provide urine sample for analysis at the hospital. - dietary modification advised   Possible dehydration Possible dehydration due to decreased water  intake. Reports improvement in urine clarity  with increased water  consumption since advised by provider last week  - Send to ED for further via EMS   Family/ staff Communication: Reviewed plan of care with patient and Husband  verbalized understanding.Send to ED via EMS left at 3: 56 pm in a stable condition.   Labs/tests ordered: None   Next Appointment: Return if symptoms worsen or fail to improve.   Total time: 30 minutes. Greater than 50% of total time spent doing patient education regarding Tachycardia, Type 2 diabetes,Pruritus,,health maintenance including symptom/medication management.   Roxan JAYSON Plough, NP

## 2023-11-17 NOTE — ED Provider Notes (Signed)
 [place] EMERGENCY DEPARTMENT AT [facility] Provider Note   CSN: [id number] Arrival date & time: 11/17/23  1615     Patient presents with: No chief complaint on file.   Lori [name] is a 53 y.o. female.   HPI This patient is a chronically ill-appearing 53 year old female, she has a history of multiple prior strokes, she has had a prior myocardial infarction and is status post triple bypass, she is a diabetic on insulin , she takes Plavix , she is also treated for hypercholesterolemia and is on Xarelto .  She presents to the hospital today with a complaint of chest pain that brought her to her family doctor's office.  Notes from the office were reviewed and showed that the patient had actually been seen the week prior for urinary symptoms but was unable to collect a urine sample because she is chronically incontinent of urine.  She is not having any urinary symptoms other than some mild dysuria at this time but actually went to the doctor's office because of chest pain today.  She was found to have tachycardia with a heart rate at about 140 bpm and a sinus tachycardia and she was sent to the emergency department.  At this time she does have ongoing chest pain which is intermittent, she is not short of breath, she is not febrile or experiencing any swelling.  The patient is not ambulatory due to her prior strokes.    Prior to Admission medications   Medication Sig Start Date End Date Taking? Authorizing Provider  acetaminophen  (TYLENOL ) 325 MG tablet Take 2 tablets (650 mg total) by mouth every 6 (six) hours as needed for mild pain (pain score 1-3) (or Fever >/= 101). 02/12/23  Yes Sebastian Toribio GAILS, MD  albuterol  (VENTOLIN  HFA) 108 [phone] Base) MCG/ACT inhaler INHALE 2 PUFFS BY MOUTH EVERY 6 HOURS AS NEEDED FOR WHEEZING AND SHORTNESS OF BREATH 09/12/23  Yes Ngetich, Dinah C, NP  baclofen  (LIORESAL ) 10 MG tablet Take 1 tablet (10 mg total) by mouth 3 (three) times  daily. 11/12/23  Yes Ngetich, Dinah C, NP  clopidogrel  (PLAVIX ) 75 MG tablet TAKE 1 TABLET BY MOUTH DAILY Patient taking differently: Take 75 mg by mouth in the morning. 04/09/23  Yes Ngetich, Dinah C, NP  Continuous Glucose Sensor (FREESTYLE LIBRE 3 PLUS SENSOR) MISC 1 each by Does not apply route continuous. Change every 15 days. Patient taking differently: Inject 1 Device into the skin See admin instructions. Place 1 new sensor into the skin every 15 days 07/30/23  Yes Thapa, [place], MD  cyclobenzaprine  (FLEXERIL ) 5 MG tablet Take 1 tablet (5 mg total) by mouth 3 (three) times daily as needed for muscle spasms. 05/07/23  Yes Ngetich, Dinah C, NP  escitalopram  (LEXAPRO ) 20 MG tablet TAKE 1 TABLET BY MOUTH EVERY  MORNING 07/22/23  Yes Ngetich, Dinah C, NP  eszopiclone (LUNESTA) 2 MG TABS tablet Take 1 tablet (2 mg total) by mouth at bedtime as needed for sleep. Take immediately before bedtime Patient taking differently: Take 2 mg by mouth at bedtime. Take immediately before bedtime 11/04/23  Yes Ngetich, Dinah C, NP  ezetimibe  (ZETIA ) 10 MG tablet TAKE 1 TABLET BY MOUTH DAILY Patient taking differently: Take 10 mg by mouth at bedtime. 04/09/23  Yes Ngetich, Dinah C, NP  fluconazole  (DIFLUCAN ) 150 MG tablet Take one tablet by mouth x 1 dose then repeat in one week 11/12/23  Yes Ngetich, Dinah C, NP  gabapentin  (NEURONTIN ) 100 MG capsule Take 1 capsule (  100 mg total) by mouth 3 (three) times daily. 10/27/23  Yes Ngetich, Dinah C, NP  HUMALOG  KWIKPEN 100 UNIT/ML KwikPen 10-15 units with meals 3 times a day, maximum 45 units/day. Patient taking differently: Inject 10-15 Units into the skin 3 (three) times daily with meals. 09/10/23  Yes Thapa, [place], MD  insulin  glargine (LANTUS  SOLOSTAR) 100 UNIT/ML Solostar Pen Inject 18 Units into the skin at bedtime. 09/10/23  Yes Thapa, [place], MD  methimazole  (TAPAZOLE ) 5 MG tablet Take 1 tablet (5 mg total) by mouth every morning. 07/01/23  Yes Thapa, [place], MD  metoCLOPramide   (REGLAN ) 10 MG tablet Take 1 tablet (10 mg total) by mouth every 6 (six) hours. 10/27/23  Yes Ngetich, Dinah C, NP  metoprolol  tartrate (LOPRESSOR ) 50 MG tablet Take 1 tablet (50 mg total) by mouth 2 (two) times daily. 10/20/23  Yes Ngetich, Dinah C, NP  nystatin  cream (MYCOSTATIN ) Apply 1 Application topically 2 (two) times daily. Patient taking differently: Apply 1 Application topically See admin instructions. Apply under both breasts once a day 11/12/23  Yes Ngetich, Dinah C, NP  olmesartan -hydrochlorothiazide  (BENICAR  HCT) 40-25 MG tablet Take 1 tablet by mouth daily. Patient taking differently: Take 1 tablet by mouth at bedtime. 10/20/23  Yes Ngetich, Dinah C, NP  ondansetron  (ZOFRAN -ODT) 4 MG disintegrating tablet Take 4 mg by mouth every 8 (eight) hours as needed for nausea or vomiting (dissolve orally).   Yes [provider]  pantoprazole  (PROTONIX ) 40 MG tablet TAKE 1 TABLET BY MOUTH TWICE  DAILY Patient taking differently: Take 40 mg by mouth 2 (two) times daily before a meal. 11/03/23  Yes Ngetich, Dinah C, NP  polyethylene glycol powder (GLYCOLAX /MIRALAX ) 17 GM/SCOOP powder Take 17 g by mouth daily. Patient taking differently: Take 17 g by mouth in the morning and at bedtime. 10/27/23  Yes Ngetich, Dinah C, NP  senna-docusate (SENOKOT-S) 8.6-50 MG tablet Take 2 tablets by mouth 2 (two) times daily. 10/27/23  Yes Ngetich, Dinah C, NP  sucralfate  (CARAFATE ) 1 GM/10ML suspension SHAKE LIQUID AND TAKE 10 ML BY MOUTH AT BEDTIME 08/14/23  Yes Armbruster, Elspeth SQUIBB, MD  tamsulosin  (FLOMAX ) 0.4 MG CAPS capsule TAKE 1 CAPSULE BY MOUTH AT BEDTIME 10/14/23  Yes Ngetich, Dinah C, NP  topiramate  (TOPAMAX ) 25 MG tablet TAKE 1 TABLET BY MOUTH TWICE DAILY Patient taking differently: Take 25 mg by mouth in the morning and at bedtime. 06/13/23  Yes Ngetich, Dinah C, NP  TYLENOL  PM EXTRA STRENGTH 500-25 MG TABS tablet Take 2 tablets by mouth at bedtime as needed (for sleep).   Yes [provider]   XARELTO  10 MG TABS tablet TAKE 1 TABLET BY MOUTH DAILY Patient taking differently: Take 10 mg by mouth in the morning. 07/09/23  Yes Ngetich, Dinah C, NP  atorvastatin  (LIPITOR ) 80 MG tablet Take 1 tablet (80 mg total) by mouth every evening. Patient not taking: Reported on 11/17/2023 07/01/23   Ngetich, [name], NP  Continuous Glucose Sensor (FREESTYLE LIBRE 3 PLUS SENSOR) MISC Change every 15 days Patient not taking: Reported on 11/17/2023 11/12/23   Thapa, [place], MD  Insulin  Pen Needle 32G X 4 MM MISC Use 4x a day 09/04/23   Thapa, [place], MD  nystatin  (MYCOSTATIN ) 100000 UNIT/ML suspension Take 5 mLs (500,000 Units total) by mouth 4 (four) times daily. Patient not taking: Reported on 11/17/2023 10/27/23   Ngetich, Dinah C, NP  ranolazine  (RANEXA ) 500 MG 12 hr tablet Take 1 tablet (500 mg total) by mouth 2 (two) times daily.  Patient not taking: Reported on 11/17/2023 06/24/23 11/17/23  Ngetich, Dinah C, NP    Allergies: Asa [aspirin ], Contrast media [iodinated contrast media], Imitrex [sumatriptan], Ms contin  [morphine ], Penicillins, Chlorhexidine  gluconate [chlorhexidine ], Firvanq  [vancomycin ], Cipro  [ciprofloxacin  hcl], Nitrofuran derivatives, and Wound dressing adhesive    Review of Systems  All other systems reviewed and are negative.   Updated Vital Signs BP 99/78   Pulse 99   Temp (!) 97.5 F (36.4 C) (Oral)   Resp 15   SpO2 100%   Physical Exam Vitals and nursing note reviewed.  Constitutional:      General: She is not in acute distress.    Appearance: She is well-developed.  HENT:     Head: Normocephalic and atraumatic.     Mouth/Throat:     Pharynx: No oropharyngeal exudate.  Eyes:     General: No scleral icterus.       Right eye: No discharge.        Left eye: No discharge.     Conjunctiva/sclera: Conjunctivae normal.     Pupils: Pupils are equal, round, and reactive to light.  Neck:     Thyroid : No thyromegaly.     Vascular: No JVD.  Cardiovascular:     Rate and  Rhythm: Regular rhythm. Tachycardia present.     Heart sounds: Normal heart sounds. No murmur heard.    No friction rub. No gallop.  Pulmonary:     Effort: Pulmonary effort is normal. No respiratory distress.     Breath sounds: Normal breath sounds. No wheezing or rales.  Abdominal:     General: Bowel sounds are normal. There is no distension.     Palpations: Abdomen is soft. There is no mass.     Tenderness: There is no abdominal tenderness.  Musculoskeletal:        General: No tenderness. Normal range of motion.     Cervical back: Normal range of motion and neck supple.     Right lower leg: No edema.     Left lower leg: No edema.  Lymphadenopathy:     Cervical: No cervical adenopathy.  Skin:    General: Skin is warm and dry.     Findings: No erythema or rash.  Neurological:     Mental Status: She is alert.     Coordination: Coordination normal.     Comments: The patient has a slurred speech on expressive speech, she is able to answer my questions appropriately with the appropriate words and has bilateral upper and lower extremity weakness but is able to move all 4 extremities to some degree and states that she is at her baseline.  Psychiatric:        Behavior: Behavior normal.     (all labs ordered are listed, but only abnormal results are displayed) Labs Reviewed  COMPREHENSIVE METABOLIC PANEL WITH GFR - Abnormal; Notable for the following components:      Result Value   CO2 19 (*)    Albumin  3.1 (*)    All other components within normal limits  CBC WITH DIFFERENTIAL/PLATELET - Abnormal; Notable for the following components:   Hemoglobin 11.5 (*)    HCT 35.8 (*)    MCV 78.0 (*)    MCH 25.1 (*)    RDW 16.8 (*)    All other components within normal limits  URINALYSIS, W/ REFLEX TO CULTURE (INFECTION SUSPECTED) - Abnormal; Notable for the following components:   APPearance HAZY (*)    Hgb urine dipstick SMALL (*)  Nitrite POSITIVE (*)    Leukocytes,Ua LARGE (*)     Bacteria, UA MANY (*)    All other components within normal limits  HEMOGLOBIN A1C - Abnormal; Notable for the following components:   Hgb A1c MFr Bld 9.1 (*)    All other components within normal limits  RESP PANEL BY RT-PCR (RSV, FLU A&B, COVID)  [id number]  URINE CULTURE  CULTURE, BLOOD (ROUTINE X 2)  CULTURE, BLOOD (ROUTINE X 2)  PROTIME-INR  I-STAT CG4 LACTIC ACID, ED  I-STAT CG4 LACTIC ACID, ED  I-STAT CG4 LACTIC ACID, ED  I-STAT CG4 LACTIC ACID, ED  TROPONIN I (HIGH SENSITIVITY)    EKG: EKG Interpretation Date/Time:  Monday November 17 2023 16:51:14 EDT Ventricular Rate:  131 PR Interval:  152 QRS Duration:  89 QT Interval:  311 QTC Calculation: 458 R Axis:   59  Text Interpretation: Sinus tachycardia Ventricular premature complex Aberrant conduction of SV complex(es) Probable left atrial enlargement Borderline T abnormalities, diffuse leads ST elevation, consider lateral injury Confirmed by Cleotilde Rogue ([id number]) on 11/17/2023 5:17:03 PM  Radiology: [facility] Chest Port 1 View Result Date: 11/17/2023 EXAM: 1 VIEW(S) XRAY OF THE CHEST 11/17/2023 05:03:00 PM COMPARISON: 06/07/2023 CLINICAL HISTORY: Questionable sepsis - evaluate for abnormality. Triage notes: Pt [facility] from PCP for possible UTI. Urinary frequency, burning, weakness x1 week, palpitations, tachy. Hx of stroke, Gen weakness BL since. 110/75 97% RA, RR 24, CBG 1130 ETC02 33, 97.8 FINDINGS: LUNGS AND PLEURA: Elevated right hemidiaphragm. No focal pulmonary opacity. No pulmonary edema. No pleural effusion. No pneumothorax. HEART AND MEDIASTINUM: Surgical changes in mediastinum. BONES AND SOFT TISSUES: Sternotomy wires in place. No acute osseous abnormality. IMPRESSION: 1. No acute cardiopulmonary findings. Electronically signed by: Norman Gatlin MD 11/17/2023 05:28 PM EDT RP Workstation: [id number]     Procedures   Medications Ordered in the ED  0.9 %  sodium chloride  infusion (150 mL/hr Intravenous New Bag/Given  11/17/23 2043)  insulin  aspart (novoLOG ) injection 0-9 Units ( Subcutaneous Not Given 11/17/23 2010)  ceFEPIme  (MAXIPIME ) 2 g in sodium chloride  0.9 % 100 mL IVPB (has no administration in time range)  lactated ringers  bolus 1,000 mL (0 mLs Intravenous Stopped 11/17/23 2038)  meropenem (MERREM) 1 g in sodium chloride  0.9 % 100 mL IVPB (0 g Intravenous Stopped 11/17/23 1940)    Clinical Course as of 11/17/23 2327  Mon Nov 17, 2023  1925 Urinalysis reveals UTI, culture sent [BM]    Clinical Course User Index [BM] Cleotilde Rogue, MD                                 Medical Decision Making Amount and/or Complexity of Data Reviewed Labs: ordered. Radiology: ordered.  Risk Decision regarding hospitalization.    This patient presents to the ED for concern of chest pain, this involves an extensive number of treatment options, and is a complaint that carries with it a high risk of complications and morbidity.  The differential diagnosis includes seems unlikely to be pulmonary embolism since she is on Xarelto , would consider cardiac disease given her history of ischemia and vascular disease of multiple different parts of her body.  Would also consider infectious cause and sepsis, the patient is not febrile, she does have a slightly low blood pressure at 105/84.  She will need in and out catheterization for urine She does report that her blood sugar has been elevated today   Co morbidities /  Chronic conditions that complicate the patient evaluation  Stroke, heart attack, diabetes   Additional history obtained:  Additional history obtained from EMR External records from outside source obtained and reviewed including medical record including office visits, the patient was last admitted to the hospital in April 2025, at that time the patient had diabetic gastroparesis with persistent vomiting   Lab Tests:  I Ordered, and personally interpreted labs.  The pertinent results include: No  leukocytosis but urinary tract infection is present   Imaging Studies ordered:  I ordered imaging studies including chest x-ray I independently visualized and interpreted imaging which showed no acute findings I agree with the radiologist interpretation   Cardiac Monitoring: / EKG:  The patient was maintained on a cardiac monitor.  I personally viewed and interpreted the cardiac monitored which showed an underlying rhythm of: Sinus tachycardia   Problem List / ED Course / Critical interventions / Medication management  The patient was given antibiotics and some IV fluids, heart rate improved, blood pressure is soft I ordered medication including antibiotics and IV fluids Reevaluation of the patient after these medicines showed that the patient slight improvement I have reviewed the patients home medicines and have made adjustments as needed   Consultations Obtained:  I requested consultation with the hospitalist,  and discussed lab and imaging findings as well as pertinent plan - they recommend: Admission   Social Determinants of Health:  Multiple prior strokes   Test / Admission - Considered:  Admit to the hospital      Final diagnoses:  Left-sided chest pain  Acute cystitis without hematuria    ED Discharge Orders     None          Cleotilde Rogue, MD 11/17/23 2327

## 2023-11-17 NOTE — H&P (Signed)
 Lori [name] FMW:[id number] DOB: 04-16-1970 DOA: 11/17/2023     PCP: Lori [name], NP   [facility]  CARDS:  Dr. Lurena MARLA Red, MD   Patient arrived to ER on 11/17/23 at 1615 Referred by Attending Lori Rogue, MD   Patient coming from:    home Lives  With family    Chief Complaint: urinary frequency, palpitations chest pain    HPI: Lori [name] is a 53 y.o. female with medical history significant of multiple strokes, CAD sp CBAG, DM2    Presented with   chest pain from UC Patient presents with possible UTI frequent urination and burning weakness for the past 1 week open has been having palpitations past history of stroke on arrival blood pressure 110/75 satting 97% on room air Also endorses having chest pain today found to be tachycardic up to 140s he was found to be sinus tachycardia she is on Xarelto  but was sent to emergency department she is not short of breath not febrile    Reports a some abd pain now improved  Patient is wheelchair bound since her last stroke  Patient reports hx of hyperthyroidism and states her heart rate at times goes up She takes metoprolol  but has not had it today Denies any fever no back pain  Has been urinating well  Reports chest pain in non pleuritic now resolved  Pain has been coming and going   Denies significant ETOH intake   Does not smoke      Regarding pertinent Chronic problems:     Hyperlipidemia - on statins Lipitor  (atorvastatin ) on zetia  Lipid Panel     Component Value Date/Time   CHOL 158 10/27/2023 1407   CHOL 148 03/01/2022 0828   TRIG 105 10/27/2023 1407   HDL 57 10/27/2023 1407   HDL 44 03/01/2022 0828   CHOLHDL 2.8 10/27/2023 1407   VLDL 31 10/07/2022 0311   LDLCALC 81 10/27/2023 1407   LABVLDL 25 03/01/2022 0828     HTN on metoprolol , benicar  /hctz    last echo  Recent Results (from the past 56199 hours)  ECHOCARDIOGRAM COMPLETE    Collection Time: 10/07/22  4:33 PM  Result Value   Weight 3,231.06   Height 62   BP 107/75   S' Lateral 2.10   AR max vel 1.51   AV Area VTI 1.58   AV Mean grad 4.0   AV Peak grad 8.3   Ao pk vel 1.44   Area-P 1/2 3.50   AV Area mean vel 1.50   Est EF 60 - 65%   Narrative      ECHOCARDIOGRAM REPORT        1. Left ventricular ejection fraction, by estimation, is 60 to 65%. The left ventricle has normal function. The left ventricle has no regional wall motion abnormalities. There is mild concentric left ventricular hypertrophy. Left ventricular diastolic  parameters were normal.  2. Right ventricular systolic function is normal. The right ventricular size is normal. There is normal pulmonary artery systolic pressure. The estimated right ventricular systolic pressure is 21.0 mmHg.  3. The mitral valve is grossly normal. Trivial mitral valve regurgitation.  4. The aortic valve is tricuspid. There is mild calcification of the aortic valve. Aortic valve regurgitation is not visualized. Aortic valve sclerosis/calcification is present, without any evidence of aortic stenosis. Aortic valve mean gradient  measures 4.0 mmHg.  5. The inferior vena cava is normal in size with greater than 50% respiratory  variability, suggesting right atrial pressure of 3 mmHg.  6. Cannot exclude a small PFO.  Comparison(s): Prior images reviewed side by side. LVEF 60-65%. Normal estimated RVSP. Cannot exclude small PFO.           CAD  - On Aspirin , statin, betablocker, Plavix                  -  followed by cardiology                    DM 2 -  Lab Results  Component Value Date   HGBA1C 9.0 (A) 07/30/2023   on insulin ,     Hyperthyroidism:   Lab Results  Component Value Date   TSH 0.26 (L) 11/12/2023   T3TOTAL 114 01/04/2023      obesity-   BMI Readings from Last 1 Encounters:  11/17/23 31.50 kg/m    Asthma -well   controlled on home inhalers/ nebs                        Hx of CVA -  with   residual deficits on  Plavix     While in ER: Clinical Course as of 11/17/23 1958  Mon Nov 17, 2023  1925 Urinalysis reveals UTI, culture sent [BM]    Clinical Course User Index [BM] Lori Rogue, MD       Lab Orders         Resp panel by RT-PCR (RSV, Flu A&B, Covid) Anterior Nasal Swab         Blood Culture (routine x 2)         Urine Culture         Comprehensive metabolic panel         CBC with Differential         Protime-INR         Urinalysis, w/ Reflex to Culture (Infection Suspected) -Urine, Catheterized         I-Stat Lactic Acid, ED       CXR -  NON acute    Following Medications were ordered in ER: Medications  lactated ringers  infusion (has no administration in time range)  lactated ringers  bolus 1,000 mL (1,000 mLs Intravenous New Bag/Given 11/17/23 1827)  meropenem (MERREM) 1 g in sodium chloride  0.9 % 100 mL IVPB (0 g Intravenous Stopped 11/17/23 1940)     ED Triage Vitals [11/17/23 1628]  Encounter Vitals Group     BP 105/84     Girls Systolic BP Percentile      Girls Diastolic BP Percentile      Boys Systolic BP Percentile      Boys Diastolic BP Percentile      Pulse Rate (!) 133     Resp 16     Temp 97.9 F (36.6 C)     Temp src      SpO2 97 %     Weight      Height      Head Circumference      Peak Flow      Pain Score      Pain Loc      Pain Education      Exclude from Growth Chart   TMAX(24)@     _________________________________________ Significant initial  Findings: Abnormal Labs Reviewed  COMPREHENSIVE METABOLIC PANEL WITH GFR - Abnormal; Notable for the following components:      Result Value   CO2 19 (*)    Albumin   3.1 (*)    All other components within normal limits  CBC WITH DIFFERENTIAL/PLATELET - Abnormal; Notable for the following components:   Hemoglobin 11.5 (*)    HCT 35.8 (*)    MCV 78.0 (*)    MCH 25.1 (*)    RDW 16.8 (*)    All other components within normal limits  URINALYSIS, W/ REFLEX TO CULTURE (INFECTION  SUSPECTED) - Abnormal; Notable for the following components:   APPearance HAZY (*)    Hgb urine dipstick SMALL (*)    Nitrite POSITIVE (*)    Leukocytes,Ua LARGE (*)    Bacteria, UA MANY (*)    All other components within normal limits      _________________________ Troponin  ordered Cardiac Panel (last 3 results) Recent Labs    11/17/23 1800  TROPONINIHS 4     ECG: Ordered Personally reviewed and interpreted by me showing: HR : 131 Rhythm:Sinus tachycardia Ventricular premature complex Aberrant conduction of SV complex(es) Probable left atrial enlargement Borderline T abnormalities, diffuse leads ST elevation, consider lateral injury QTC 458  ____________________ This patient meets SIRS Criteria and may be septic.    The recent clinical data is shown below. Vitals:   11/17/23 1628 11/17/23 1837  BP: 105/84 100/73  Pulse: (!) 133 (!) 103  Resp: 16 18  Temp: 97.9 F (36.6 C)   SpO2: 97% 100%    WBC     Component Value Date/Time   WBC 8.0 11/17/2023 1800   LYMPHSABS 3.6 11/17/2023 1800   MONOABS 0.7 11/17/2023 1800   EOSABS 0.3 11/17/2023 1800   BASOSABS 0.1 11/17/2023 1800    Lactic Acid, Venous    Component Value Date/Time   LATICACIDVEN 1.2 11/17/2023 1833        Procalcitonin   Ordered      UA   evidence of UTI      Urine analysis:    Component Value Date/Time   COLORURINE YELLOW 11/17/2023 1653   APPEARANCEUR HAZY (A) 11/17/2023 1653   LABSPEC 1.010 11/17/2023 1653   PHURINE 5.0 11/17/2023 1653   GLUCOSEU NEGATIVE 11/17/2023 1653   HGBUR SMALL (A) 11/17/2023 1653   BILIRUBINUR NEGATIVE 11/17/2023 1653   KETONESUR NEGATIVE 11/17/2023 1653   PROTEINUR NEGATIVE 11/17/2023 1653   NITRITE POSITIVE (A) 11/17/2023 1653   LEUKOCYTESUR LARGE (A) 11/17/2023 1653    Results for orders placed or performed in visit on 10/27/23  Urine Culture     Status: None   Collection Time: 10/30/23 12:27 PM   Specimen: Urine  Result Value Ref Range  Status   MICRO NUMBER: [id number]  Final   SPECIMEN QUALITY: Adequate  Final   Sample Source URINE, CLEAN CATCH  Final   STATUS: FINAL  Final   Result:   Final    Mixed genital flora isolated. These superficial bacteria are not indicative of a urinary tract infection. No further organism identification is warranted on this specimen. If clinically indicated, recollect clean-catch, mid-stream urine and transfer  immediately to Urine Culture Transport Tube.     ABX started Antibiotics Given (last 72 hours)     Date/Time Action Medication Dose Rate   11/17/23 1830 New Bag/Given   meropenem (MERREM) 1 g in sodium chloride  0.9 % 100 mL IVPB 1 g 200 mL/hr         __________________________________________________________ Recent Labs  Lab 11/17/23 1800  NA 135  K 4.1  CO2 19*  GLUCOSE 94  BUN 19  CREATININE 0.87  CALCIUM  9.3  Cr  stable,   Lab Results  Component Value Date   CREATININE 0.87 11/17/2023   CREATININE 1.28 (H) 10/27/2023   CREATININE 0.72 08/01/2023    Recent Labs  Lab 11/17/23 1800  AST 17  ALT 18  ALKPHOS 85  BILITOT 0.3  PROT 6.6  ALBUMIN  3.1*   Lab Results  Component Value Date   CALCIUM  9.3 11/17/2023   PHOS 3.5 04/29/2023        Plt: Lab Results  Component Value Date   PLT 351 11/17/2023       Recent Labs  Lab 11/17/23 1800  WBC 8.0  NEUTROABS 3.4  HGB 11.5*  HCT 35.8*  MCV 78.0*  PLT 351    HG/HCT  stable,      Component Value Date/Time   HGB 11.5 (L) 11/17/2023 1800   HCT 35.8 (L) 11/17/2023 1800   MCV 78.0 (L) 11/17/2023 1800        _______________________________________________ Hospitalist was called for admission for   Left-sided chest pain    Acute cystitis without hematuria      The following Work up has been ordered so far:  Orders Placed This Encounter  Procedures   Resp panel by RT-PCR (RSV, Flu A&B, Covid) Anterior Nasal Swab   Blood Culture (routine x 2)   Urine Culture   DG Chest Port 1 View    Comprehensive metabolic panel   CBC with Differential   Protime-INR   Urinalysis, w/ Reflex to Culture (Infection Suspected) -Urine, Catheterized   Diet Carb Modified Room service appropriate? Yes; Fluid consistency: Thin   Document height and weight   Assess and Document Glasgow Coma Scale   Document vital signs within 1-hour of fluid bolus completion. Notify provider of abnormal vital signs despite fluid resuscitation.   DO NOT delay antibiotics if unable to obtain blood culture.   Refer to Sidebar Report: Sepsis Sidebar ED/IP   Notify provider for difficulties obtaining IV access.   Insert peripheral IV x 2   Initiate Carrier Fluid Protocol   Code Sepsis activation.  This occurs automatically when order is signed and prioritizes pharmacy, lab, and radiology services for STAT collections and interventions.  If [facility] downtime, call [facility] [phone]) to activate Code Sepsis.   monitoring by pharmacy   Consult to hospitalist   I-Stat Lactic Acid, ED   EKG 12-Lead   ED EKG     OTHER Significant initial  Findings:  labs showing:     DM  labs:  HbA1C: Recent Labs    11/24/22 1810 04/25/23 1430 07/30/23 1244  HGBA1C 8.5* 10.0* 9.0*       CBG (last 3)  No results for input(s): GLUCAP in the last 72 hours.        Cultures:    Component Value Date/Time   SDES BLOOD BLOOD RIGHT HAND 04/27/2023 2304   SPECREQUEST  04/27/2023 2304    BOTTLES DRAWN AEROBIC ONLY Blood Culture results may not be optimal due to an inadequate volume of blood received in culture bottles   CULT  04/27/2023 2304    NO GROWTH 5 DAYS Performed at [facility], [address], [place], [place] [address]    REPTSTATUS 05/02/2023 FINAL 04/27/2023 2304     Radiological Exams on Admission: DG Chest Port 1 View Result Date: 11/17/2023 EXAM: 1 VIEW(S) XRAY OF THE CHEST 11/17/2023 05:03:00 PM COMPARISON: 06/07/2023 CLINICAL HISTORY: Questionable sepsis - evaluate for abnormality. Triage notes:  Pt BIb [facility] from PCP for possible UTI.  Urinary frequency, burning, weakness x1 week, palpitations, tachy. Hx of stroke, Gen weakness BL since. 110/75 97% RA, RR 24, CBG 1130 ETC02 33, 97.8 FINDINGS: LUNGS AND PLEURA: Elevated right hemidiaphragm. No focal pulmonary opacity. No pulmonary edema. No pleural effusion. No pneumothorax. HEART AND MEDIASTINUM: Surgical changes in mediastinum. BONES AND SOFT TISSUES: Sternotomy wires in place. No acute osseous abnormality. IMPRESSION: 1. No acute cardiopulmonary findings. Electronically signed by: Norman Gatlin MD 11/17/2023 05:28 PM EDT RP Workstation: [id number]   _______________________________________________________________________________________________________ Latest  Blood pressure 100/73, pulse (!) 103, temperature 97.9 F (36.6 C), resp. rate 18, SpO2 100%.   Vitals  labs and radiology finding personally reviewed  Review of Systems:    Pertinent positives include: Abdominal pain tachycardia chest pain  Constitutional:  No weight loss, night sweats, Fevers, chills, fatigue, weight loss  HEENT:  No headaches, Difficulty swallowing,Tooth/dental problems,Sore throat,  No sneezing, itching, ear ache, nasal congestion, post nasal drip,  Cardio-vascular:  No chest pain, Orthopnea, PND, anasarca, dizziness, palpitations.no Bilateral lower extremity swelling  GI:  No heartburn, indigestion, abdominal pain, nausea, vomiting, diarrhea, change in bowel habits, loss of appetite, melena, blood in stool, hematemesis Resp:  no shortness of breath at rest. No dyspnea on exertion, No excess mucus, no productive cough, No non-productive cough, No coughing up of blood.No change in color of mucus.No wheezing. Skin:  no rash or lesions. No jaundice GU:  no dysuria, change in color of urine, no urgency or frequency. No straining to urinate.  No flank pain.  Musculoskeletal:  No joint pain or no joint swelling. No decreased range of motion. No back pain.   Psych:  No change in mood or affect. No depression or anxiety. No memory loss.  Neuro: no localizing neurological complaints, no tingling, no weakness, no double vision, no gait abnormality, no slurred speech, no confusion  All systems reviewed and apart from HOPI all are negative _______________________________________________________________________________________________ Past Medical History:   Past Medical History:  Diagnosis Date   Acute bronchitis 08/24/2021   Acute cystitis 12/26/2022   Acute metabolic encephalopathy 11/04/2022   Acute renal failure (ARF) 04/27/2023   AKI (acute kidney injury) 04/30/2022   Aspiration pneumonia (HCC) 10/13/2022   CAD (coronary artery disease)    Cerebral infarction, unspecified (HCC) 09/27/2012   Chest pain of uncertain etiology 10/06/2022   Depression    Diabetes mellitus without complication (HCC)    Diabetic hyperosmolar non-ketotic state (HCC) 01/19/2023   DKA (diabetic ketoacidosis) (HCC) 02/19/2021   History of CT scan    History of mammogram    History of MRI    Hypertension    Pheochromocytoma    Reversible cerebrovascular vasoconstriction syndrome    Spell of abnormal behavior    Stroke [facility])    Thyroid  disease       Past Surgical History:  Procedure Laterality Date   ABDOMINAL HYSTERECTOMY     BIOPSY  02/06/2023   Procedure: BIOPSY;  Surgeon: Leigh Elspeth SQUIBB, MD;  Location: [facility];  Service: Gastroenterology;;   CESAREAN SECTION     3   CORONARY ARTERY BYPASS GRAFT     ESOPHAGOGASTRODUODENOSCOPY (EGD) WITH PROPOFOL  N/A 02/06/2023   Procedure: ESOPHAGOGASTRODUODENOSCOPY (EGD) WITH PROPOFOL ;  Surgeon: Leigh Elspeth SQUIBB, MD;  Location: [facility];  Service: Gastroenterology;  Laterality: N/A;   Right adrenal gland removal for pheochromocytoma Right     Social History:  Ambulatory    wheelchair bound, bed bound     reports that she has never smoked. She has never  used smokeless tobacco. She reports that  she does not currently use alcohol. She reports that she does not use drugs.     Family History:   Family History  Problem Relation Age of Onset   Hypertension Mother    Diabetes Mother    Atrial fibrillation Mother    Hypertension Father    Heart attack Father    Dementia Father    Diabetes Brother    Brain cancer Brother    Asthma Daughter    Sickle cell anemia Son    Atrial fibrillation Maternal Uncle    ______________________________________________________________________________________________ Allergies: Allergies  Allergen Reactions   Asa [Aspirin ] Anaphylaxis and Swelling   Contrast Media [Iodinated Contrast Media] Anaphylaxis and Swelling   Imitrex [Sumatriptan] Anaphylaxis   Ms Contin  [Morphine ] Anaphylaxis and Swelling   Penicillins Swelling and Other (See Comments)    Mouth and eyes swell   Chlorhexidine  Gluconate [Chlorhexidine ] Itching and Rash   Firvanq  [Vancomycin ] Other (See Comments)    [name] Man's Syndrome   Cipro  [Ciprofloxacin  Hcl] Nausea And Vomiting   Nitrofuran Derivatives Anxiety   Wound Dressing Adhesive Itching and Rash     Prior to Admission medications   Medication Sig Start Date End Date Taking? Authorizing Provider  acetaminophen  (TYLENOL ) 325 MG tablet Take 2 tablets (650 mg total) by mouth every 6 (six) hours as needed for mild pain (pain score 1-3) (or Fever >/= 101). 02/12/23   Sebastian Toribio GAILS, MD  albuterol  (VENTOLIN  HFA) 108 (90 Base) MCG/ACT inhaler INHALE 2 PUFFS BY MOUTH EVERY 6 HOURS AS NEEDED FOR WHEEZING AND SHORTNESS OF BREATH 09/12/23   Ngetich, Dinah C, NP  atorvastatin  (LIPITOR ) 80 MG tablet Take 1 tablet (80 mg total) by mouth every evening. 07/01/23   Ngetich, Dinah C, NP  baclofen  (LIORESAL ) 10 MG tablet Take 1 tablet (10 mg total) by mouth 3 (three) times daily. 11/12/23   Ngetich, Dinah C, NP  clopidogrel  (PLAVIX ) 75 MG tablet TAKE 1 TABLET BY MOUTH DAILY 04/09/23   Ngetich, Dinah C, NP  Continuous Glucose Sensor (FREESTYLE  LIBRE 3 PLUS SENSOR) MISC 1 each by Does not apply route continuous. Change every 15 days. 07/30/23   Thapa, [place], MD  Continuous Glucose Sensor (FREESTYLE LIBRE 3 PLUS SENSOR) MISC Change every 15 days 11/12/23   Thapa, [place], MD  cyclobenzaprine  (FLEXERIL ) 5 MG tablet Take 1 tablet (5 mg total) by mouth 3 (three) times daily as needed for muscle spasms. 05/07/23   Ngetich, Dinah C, NP  escitalopram  (LEXAPRO ) 20 MG tablet TAKE 1 TABLET BY MOUTH EVERY  MORNING 07/22/23   Ngetich, Dinah C, NP  eszopiclone (LUNESTA) 2 MG TABS tablet Take 1 tablet (2 mg total) by mouth at bedtime as needed for sleep. Take immediately before bedtime 11/04/23   Ngetich, Dinah C, NP  ezetimibe  (ZETIA ) 10 MG tablet TAKE 1 TABLET BY MOUTH DAILY 04/09/23   Ngetich, Dinah C, NP  fluconazole  (DIFLUCAN ) 150 MG tablet Take one tablet by mouth x 1 dose then repeat in one week 11/12/23   Ngetich, Dinah C, NP  gabapentin  (NEURONTIN ) 100 MG capsule Take 1 capsule (100 mg total) by mouth 3 (three) times daily. 10/27/23   Ngetich, Dinah C, NP  HUMALOG  KWIKPEN 100 UNIT/ML KwikPen 10-15 units with meals 3 times a day, maximum 45 units/day. 09/10/23   Thapa, [place], MD  insulin  glargine (LANTUS  SOLOSTAR) 100 UNIT/ML Solostar Pen Inject 18 Units into the skin at bedtime. 09/10/23   Thapa, [place], MD  Insulin  Pen  Needle 32G X 4 MM MISC Use 4x a day 09/04/23   Thapa, [place], MD  methimazole  (TAPAZOLE ) 5 MG tablet Take 1 tablet (5 mg total) by mouth every morning. 07/01/23   Thapa, [place], MD  metoCLOPramide  (REGLAN ) 10 MG tablet Take 1 tablet (10 mg total) by mouth every 6 (six) hours. 10/27/23   Ngetich, Dinah C, NP  metoprolol  tartrate (LOPRESSOR ) 50 MG tablet Take 1 tablet (50 mg total) by mouth 2 (two) times daily. 10/20/23   Ngetich, Dinah C, NP  nystatin  (MYCOSTATIN ) 100000 UNIT/ML suspension Take 5 mLs (500,000 Units total) by mouth 4 (four) times daily. 10/27/23   Ngetich, Dinah C, NP  nystatin  cream (MYCOSTATIN ) Apply 1 Application topically 2 (two) times  daily. 11/12/23   Ngetich, Dinah C, NP  olmesartan -hydrochlorothiazide  (BENICAR  HCT) 40-25 MG tablet Take 1 tablet by mouth daily. 10/20/23   Ngetich, Dinah C, NP  pantoprazole  (PROTONIX ) 40 MG tablet TAKE 1 TABLET BY MOUTH TWICE  DAILY 11/03/23   Ngetich, Dinah C, NP  polyethylene glycol powder (GLYCOLAX /MIRALAX ) 17 GM/SCOOP powder Take 17 g by mouth daily. 10/27/23   Ngetich, Dinah C, NP  ranolazine  (RANEXA ) 500 MG 12 hr tablet Take 1 tablet (500 mg total) by mouth 2 (two) times daily. Patient not taking: Reported on 11/17/2023 06/24/23 11/12/23  Ngetich, Dinah C, NP  senna-docusate (SENOKOT-S) 8.6-50 MG tablet Take 2 tablets by mouth 2 (two) times daily. 10/27/23   Ngetich, Dinah C, NP  sucralfate  (CARAFATE ) 1 GM/10ML suspension SHAKE LIQUID AND TAKE 10 ML BY MOUTH AT BEDTIME 08/14/23   Armbruster, Elspeth SQUIBB, MD  tamsulosin  (FLOMAX ) 0.4 MG CAPS capsule TAKE 1 CAPSULE BY MOUTH AT BEDTIME 10/14/23   Ngetich, Dinah C, NP  topiramate  (TOPAMAX ) 25 MG tablet TAKE 1 TABLET BY MOUTH TWICE DAILY 06/13/23   Ngetich, Dinah C, NP  XARELTO  10 MG TABS tablet TAKE 1 TABLET BY MOUTH DAILY 07/09/23   Ngetich, Dinah C, NP    ___________________________________________________________________________________________________ Physical Exam:    11/17/2023    6:37 PM 11/17/2023    4:28 PM 11/17/2023    2:45 PM  Vitals with BMI  Height   5' 2  Weight   172 lbs 3 oz  BMI   31.49  Systolic 100 105 869  Diastolic 73 84 80  Pulse 103 133 138     1. General:  in No  Acute distress    Chronically ill   -appearing 2. Psychological: Alert and   Oriented 3. Head/ENT:   Moist  Mucous Membranes                          Head Non traumatic, neck supple                         Poor Dentition 4. SKIN: decreased Skin turgor,  Skin clean Dry and intact no rash    5. Heart: Regular rate and rhythm no  Murmur, no Rub or gallop 6. Lungs:  , no wheezes or crackles   7. Abdomen: Soft,  non-tender, Non distended   obese  bowel  sounds present 8. Lower extremities: no clubbing, cyanosis, no  edema 9. Neurologically decresed strength in the lower extremities 10. MSK: Normal range of motion    Chart has been reviewed  ______________________________________________________________________________________________  Assessment/Plan  53 y.o. female with medical history significant of multiple strokes, CAD sp CBAG, DM2    Admitted for  Left-sided  chest pain    Acute cystitis without hematuria   Present on Admission:  Sepsis (HCC)  Tachycardia  Chronic diastolic CHF (congestive heart failure) (HCC)  Essential hypertension  Hyperthyroidism  Diabetic gastroparesis (HCC)  Aphasia  CAD (coronary artery disease)  Chronic anemia  Debility  Migraines  Morbid obesity (HCC)  UTI (urinary tract infection)  Chest pain of uncertain etiology     Tachycardia Recurrent in the setting of history of hyperthyroidism Check TSH T3-T4 continue Tapazole  and restart metoprolol  rehydrate  Chronic diastolic CHF (congestive heart failure) (HCC) Currently appears to be slightly on the dry side we will gently rehydrate  Essential hypertension Allow permissive hypertension except for metoprolol  restarted  Hyperthyroidism Check TSH restart Tapazole  5 g a day and metoprolol  50 mg twice daily  OSA on CPAP Write for CPAP  Diabetic gastroparesis (HCC) Order Reglan   Anticoagulated Patient explains to me that she is on Xarelto  for history of multiple strokes  Aphasia Chronic secondary to strokes  CAD (coronary artery disease) Chronic stable continue   Zetia  10 mg daily  Chronic anemia Obtain anemia panel  Transfuse for Hg <7 , rapidly dropping or  if symptomatic   Debility Chronic secondary to repeated strokes  History of CVA with residual deficit Continue Plavix  and Xarelto   Insulin  dependent type 2 diabetes mellitus (HCC) Order sliding scale continue Lantus  but decrease dose down to 10  units  Migraines Continue Topamax   Morbid obesity (HCC) Contributing to comorbidity and complicating medical management    Nutritional follow up as an out pt would be recommended   Sepsis (HCC)   -SIRS criteria met with       Component Value Date/Time   WBC 8.0 11/17/2023 1800   LYMPHSABS 3.6 11/17/2023 1800     tachycardia    RR >20 Today's Vitals   11/17/23 1628 11/17/23 1837 11/17/23 2017 11/17/23 2315  BP: 105/84 100/73  99/78  Pulse: (!) 133 (!) 103  99  Resp: 16 18  15   Temp: 97.9 F (36.6 C)  (!) 97.5 F (36.4 C)   TempSrc:   Oral   SpO2: 97% 100%  100%      The recent clinical data is shown below. Vitals:   11/17/23 1628 11/17/23 1837 11/17/23 2017 11/17/23 2315  BP: 105/84 100/73  99/78  Pulse: (!) 133 (!) 103  99  Resp: 16 18  15   Temp: 97.9 F (36.6 C)  (!) 97.5 F (36.4 C)   TempSrc:   Oral   SpO2: 97% 100%  100%       SIRS criteria were due to ( dehydration chronic recurrent tachycardia   -Most likely source being:  urinary,       - Obtain serial lactic acid and procalcitonin level.  - Initiated IV antibiotics in ER: Antibiotics Given (last 72 hours)     Date/Time Action Medication Dose Rate   11/17/23 1830 New Bag/Given   meropenem (MER[facility]) 1 g in sodium chloride  0.9 % 100 mL IVPB 1 g 200 mL/hr       Will continue  on : Cefepime    - await results of blood and urine culture  - Rehydrate aggressively  Intravenous fluids were administered,    11:43 PM     UTI (urinary tract infection)  - treat with cefepime  adjust coverage as needed pending of urine culture Discussed with pharmacy   Chest pain of uncertain etiology Continue to cycle cardiac enzymes of unremarkable.  Monitor EKG monitor on telemetry currently chest  pain-free Patient reports she has intermittent chest pains she also does have history of CAD. Repeat echogram if abnormal may need further cardiology involvement patient has multifactorial causes for chest pain  including GERD    Other plan as per orders.  DVT prophylaxis:  xarelto    Code Status:    Code Status: Prior FULL CODE  as per patient   I had personally discussed CODE STATUS with patient    ACP   none    Family Communication:   Family not at  Bedside    Diet  Diet Orders (From admission, onward)     Start     Ordered   11/17/23 1941  Diet Carb Modified Room service appropriate? Yes; Fluid consistency: Thin  Diet effective now       Question Answer Comment  Calorie Level Medium 1600-2000   Room service appropriate? Yes   Fluid consistency: Thin      11/17/23 1940            Disposition Plan:    To home once workup is complete and patient is stable   Following barriers for discharge:                             Chest pain work up is complete                            Electrolytes corrected                                                          Pain controlled with PO medications                               Afebrile, white count improving able to transition to PO antibiotics                                   Consult Orders  (From admission, onward)           Start     Ordered   11/17/23 1940  Consult to hospitalist  Pg by chloe 19:42  Once       Provider:  (Not yet assigned)  Question Answer Comment  Place call to: [facility]   Reason for Consult Admit      11/17/23 1939                               Consults called:    NONE   Admission status:  ED Disposition     ED Disposition  Admit   Condition  --   Comment  The patient appears reasonably stabilized for admission considering the current resources, flow, and capabilities available in the ED at this time, and I doubt any other [facility] requiring further screening and/or treatment in the ED prior to admission is  present.             inpatient     I Expect 2 midnight stay secondary to severity of patient's current illness need for inpatient interventions justified  by the  following:  hemodynamic instability despite optimal treatment (tachycardia )   Severe lab/radiological/exam abnormalities including:    Left-sided chest pain    Acute cystitis without hematuria    and extensive comorbidities including:  CAD asthma   That are currently affecting medical management.   I expect  patient to be hospitalized for 2 midnights requiring inpatient medical care.  Patient is at high risk for adverse outcome (such as loss of life or disability) if not treated.  Indication for inpatient stay as follows:   Hemodynamic instability despite maximal medical therapy,     Need for IV antibiotics, IV fluids,     Level of care         progressive      Seichi Kaufhold 11/17/2023, 11:49 PM    [facility]     after 2 AM please page floor coverage   If 7AM-7PM, please contact the day team taking care of the patient using [email or web address]

## 2023-11-17 NOTE — Assessment & Plan Note (Signed)
 Write for CPAP

## 2023-11-17 NOTE — Assessment & Plan Note (Addendum)
 Order Reglan 

## 2023-11-18 ENCOUNTER — Inpatient Hospital Stay (HOSPITAL_COMMUNITY)

## 2023-11-18 ENCOUNTER — Encounter (HOSPITAL_COMMUNITY): Payer: Self-pay | Admitting: Internal Medicine

## 2023-11-18 ENCOUNTER — Other Ambulatory Visit: Payer: Self-pay

## 2023-11-18 DIAGNOSIS — R079 Chest pain, unspecified: Secondary | ICD-10-CM

## 2023-11-18 DIAGNOSIS — R000 Tachycardia, unspecified: Secondary | ICD-10-CM | POA: Diagnosis not present

## 2023-11-18 LAB — GLUCOSE, CAPILLARY
Glucose-Capillary: 145 mg/dL — ABNORMAL HIGH (ref 70–99)
Glucose-Capillary: 184 mg/dL — ABNORMAL HIGH (ref 70–99)
Glucose-Capillary: 191 mg/dL — ABNORMAL HIGH (ref 70–99)
Glucose-Capillary: 262 mg/dL — ABNORMAL HIGH (ref 70–99)

## 2023-11-18 LAB — COMPREHENSIVE METABOLIC PANEL WITH GFR
ALT: 18 U/L (ref 0–44)
AST: 17 U/L (ref 15–41)
Albumin: 2.9 g/dL — ABNORMAL LOW (ref 3.5–5.0)
Alkaline Phosphatase: 85 U/L (ref 38–126)
Anion gap: 9 (ref 5–15)
BUN: 18 mg/dL (ref 6–20)
CO2: 20 mmol/L — ABNORMAL LOW (ref 22–32)
Calcium: 8.7 mg/dL — ABNORMAL LOW (ref 8.9–10.3)
Chloride: 106 mmol/L (ref 98–111)
Creatinine, Ser: 0.95 mg/dL (ref 0.44–1.00)
GFR, Estimated: >60 mL/min (ref >60)
Glucose, Bld: 260 mg/dL — ABNORMAL HIGH (ref 70–99)
Potassium: 4.8 mmol/L (ref 3.5–5.1)
Sodium: 135 mmol/L (ref 135–145)
Total Bilirubin: 0.4 mg/dL (ref 0.0–1.2)
Total Protein: 6.7 g/dL (ref 6.5–8.1)

## 2023-11-18 LAB — URINE CULTURE: Culture: <10000 — AB

## 2023-11-18 LAB — ECHOCARDIOGRAM COMPLETE
Area-P 1/2: 4.29 cm2
Height: 62 in
S' Lateral: 2.8 cm
Weight: 2751.34 [oz_av]

## 2023-11-18 LAB — CBC
HCT: 36.6 % (ref 36.0–46.0)
Hemoglobin: 11.7 g/dL — ABNORMAL LOW (ref 12.0–15.0)
MCH: 25.2 pg — ABNORMAL LOW (ref 26.0–34.0)
MCHC: 32 g/dL (ref 30.0–36.0)
MCV: 78.9 fL — ABNORMAL LOW (ref 80.0–100.0)
Platelets: 313 K/uL (ref 150–400)
RBC: 4.64 MIL/uL (ref 3.87–5.11)
RDW: 16.9 % — ABNORMAL HIGH (ref 11.5–15.5)
WBC: 6.3 K/uL (ref 4.0–10.5)
nRBC: 0 % (ref 0.0–0.2)

## 2023-11-18 LAB — FERRITIN: Ferritin: 18 ng/mL (ref 11–307)

## 2023-11-18 LAB — CBG MONITORING, ED
Glucose-Capillary: 174 mg/dL — ABNORMAL HIGH (ref 70–99)
Glucose-Capillary: 184 mg/dL — ABNORMAL HIGH (ref 70–99)
Glucose-Capillary: 246 mg/dL — ABNORMAL HIGH (ref 70–99)
Glucose-Capillary: 285 mg/dL — ABNORMAL HIGH (ref 70–99)

## 2023-11-18 LAB — VITAMIN B12: Vitamin B-12: 304 pg/mL (ref 180–914)

## 2023-11-18 LAB — IRON AND TIBC
Iron: 50 ug/dL (ref 28–170)
Saturation Ratios: 16 % (ref 10.4–31.8)
TIBC: 323 ug/dL (ref 250–450)
UIBC: 273 ug/dL

## 2023-11-18 LAB — MAGNESIUM: Magnesium: 1.6 mg/dL — ABNORMAL LOW (ref 1.7–2.4)

## 2023-11-18 LAB — PHOSPHORUS: Phosphorus: 3.2 mg/dL (ref 2.5–4.6)

## 2023-11-18 LAB — FOLATE: Folate: 16.1 ng/mL (ref >5.9)

## 2023-11-18 MED ORDER — SUCRALFATE 1 GM/10ML PO SUSP
1.0000 g | Freq: Three times a day (TID) | ORAL | Status: DC
  Administered 2023-11-18 – 2023-11-19 (×7): 1 g via ORAL
  Filled 2023-11-18 (×10): qty 10

## 2023-11-18 MED ORDER — LIVING WELL WITH DIABETES BOOK
Freq: Once | Status: AC
  Filled 2023-11-18: qty 1

## 2023-11-18 MED ORDER — ESCITALOPRAM OXALATE 10 MG PO TABS
20.0000 mg | ORAL_TABLET | Freq: Every morning | ORAL | Status: DC
  Administered 2023-11-18 – 2023-11-20 (×3): 20 mg via ORAL
  Filled 2023-11-18 (×3): qty 2

## 2023-11-18 MED ORDER — RIVAROXABAN 10 MG PO TABS
10.0000 mg | ORAL_TABLET | Freq: Every day | ORAL | Status: DC
  Administered 2023-11-18 – 2023-11-20 (×3): 10 mg via ORAL
  Filled 2023-11-18 (×3): qty 1

## 2023-11-18 MED ORDER — FLUCONAZOLE 150 MG PO TABS
150.0000 mg | ORAL_TABLET | Freq: Once | ORAL | Status: AC
  Administered 2023-11-18: 150 mg via ORAL
  Filled 2023-11-18: qty 1

## 2023-11-18 MED ORDER — PANTOPRAZOLE SODIUM 40 MG PO TBEC
40.0000 mg | DELAYED_RELEASE_TABLET | Freq: Two times a day (BID) | ORAL | Status: DC
  Administered 2023-11-18 – 2023-11-20 (×5): 40 mg via ORAL
  Filled 2023-11-18 (×5): qty 1

## 2023-11-18 MED ORDER — ONDANSETRON HCL 4 MG PO TABS: 4.0000 mg | ORAL_TABLET | Freq: Four times a day (QID) | ORAL | Status: DC | PRN

## 2023-11-18 MED ORDER — GABAPENTIN 100 MG PO CAPS
100.0000 mg | ORAL_CAPSULE | Freq: Three times a day (TID) | ORAL | Status: DC
  Administered 2023-11-18 – 2023-11-20 (×7): 100 mg via ORAL
  Filled 2023-11-18 (×7): qty 1

## 2023-11-18 MED ORDER — SODIUM CHLORIDE 0.9 % IV SOLN: INTRAVENOUS | Status: DC

## 2023-11-18 MED ORDER — METHIMAZOLE 5 MG PO TABS
5.0000 mg | ORAL_TABLET | Freq: Every morning | ORAL | Status: DC
  Administered 2023-11-18 – 2023-11-20 (×3): 5 mg via ORAL
  Filled 2023-11-18 (×4): qty 1

## 2023-11-18 MED ORDER — CYCLOBENZAPRINE HCL 5 MG PO TABS
5.0000 mg | ORAL_TABLET | Freq: Three times a day (TID) | ORAL | Status: DC | PRN
  Administered 2023-11-18 (×2): 5 mg via ORAL
  Filled 2023-11-18 (×2): qty 1

## 2023-11-18 MED ORDER — BACLOFEN 10 MG PO TABS
10.0000 mg | ORAL_TABLET | Freq: Three times a day (TID) | ORAL | Status: DC
  Administered 2023-11-18 – 2023-11-20 (×7): 10 mg via ORAL
  Filled 2023-11-18 (×7): qty 1

## 2023-11-18 MED ORDER — CLOPIDOGREL BISULFATE 75 MG PO TABS
75.0000 mg | ORAL_TABLET | Freq: Every morning | ORAL | Status: DC
  Administered 2023-11-18 – 2023-11-20 (×3): 75 mg via ORAL
  Filled 2023-11-18 (×3): qty 1

## 2023-11-18 MED ORDER — ALBUTEROL SULFATE (2.5 MG/3ML) 0.083% IN NEBU
2.5000 mg | INHALATION_SOLUTION | RESPIRATORY_TRACT | Status: DC | PRN
  Administered 2023-11-19: 2.5 mg via RESPIRATORY_TRACT
  Filled 2023-11-18: qty 3

## 2023-11-18 MED ORDER — ACETAMINOPHEN 650 MG RE SUPP: 650.0000 mg | Freq: Four times a day (QID) | RECTAL | Status: DC | PRN

## 2023-11-18 MED ORDER — ONDANSETRON HCL 4 MG/2ML IJ SOLN: 4.0000 mg | Freq: Four times a day (QID) | INTRAMUSCULAR | Status: DC | PRN

## 2023-11-18 MED ORDER — METOCLOPRAMIDE HCL 5 MG PO TABS
10.0000 mg | ORAL_TABLET | Freq: Four times a day (QID) | ORAL | Status: DC
  Administered 2023-11-18 – 2023-11-20 (×11): 10 mg via ORAL
  Filled 2023-11-18 (×3): qty 2
  Filled 2023-11-18: qty 1
  Filled 2023-11-18 (×5): qty 2
  Filled 2023-11-18: qty 1
  Filled 2023-11-18: qty 2

## 2023-11-18 MED ORDER — TOPIRAMATE 25 MG PO TABS
25.0000 mg | ORAL_TABLET | Freq: Every day | ORAL | Status: DC
  Administered 2023-11-18 – 2023-11-20 (×3): 25 mg via ORAL
  Filled 2023-11-18 (×3): qty 1

## 2023-11-18 MED ORDER — EZETIMIBE 10 MG PO TABS
10.0000 mg | ORAL_TABLET | Freq: Every day | ORAL | Status: DC
  Administered 2023-11-18 – 2023-11-19 (×3): 10 mg via ORAL
  Filled 2023-11-18 (×3): qty 1

## 2023-11-18 MED ORDER — SENNOSIDES-DOCUSATE SODIUM 8.6-50 MG PO TABS
2.0000 | ORAL_TABLET | Freq: Two times a day (BID) | ORAL | Status: DC
Start: 2023-11-18 — End: 2023-11-20
  Administered 2023-11-18 – 2023-11-20 (×5): 2 via ORAL
  Filled 2023-11-18 (×5): qty 2

## 2023-11-18 MED ORDER — ACETAMINOPHEN 325 MG PO TABS: 650.0000 mg | ORAL_TABLET | Freq: Four times a day (QID) | ORAL | Status: DC | PRN

## 2023-11-18 MED ORDER — NYSTATIN 100000 UNIT/GM EX CREA: 1.0000 | TOPICAL_CREAM | Freq: Every day | CUTANEOUS | Status: DC | PRN

## 2023-11-18 MED ORDER — METOPROLOL TARTRATE 50 MG PO TABS
50.0000 mg | ORAL_TABLET | Freq: Two times a day (BID) | ORAL | Status: DC
  Administered 2023-11-18 – 2023-11-20 (×5): 50 mg via ORAL
  Filled 2023-11-18: qty 2
  Filled 2023-11-18: qty 1
  Filled 2023-11-18: qty 2
  Filled 2023-11-18 (×3): qty 1

## 2023-11-18 MED ORDER — TAMSULOSIN HCL 0.4 MG PO CAPS
0.4000 mg | ORAL_CAPSULE | Freq: Every day | ORAL | Status: DC
  Administered 2023-11-18 – 2023-11-19 (×3): 0.4 mg via ORAL
  Filled 2023-11-18 (×3): qty 1

## 2023-11-18 MED ORDER — FENTANYL CITRATE (PF) 50 MCG/ML IJ SOSY: 12.5000 ug | PREFILLED_SYRINGE | INTRAMUSCULAR | Status: DC | PRN

## 2023-11-18 MED ORDER — INSULIN GLARGINE 100 UNIT/ML ~~LOC~~ SOLN
10.0000 [IU] | Freq: Every day | SUBCUTANEOUS | Status: DC
  Filled 2023-11-18 (×2): qty 0.1

## 2023-11-18 MED ORDER — HYDROCODONE-ACETAMINOPHEN 5-325 MG PO TABS
1.0000 | ORAL_TABLET | ORAL | Status: DC | PRN
  Administered 2023-11-18 (×2): 1 via ORAL
  Administered 2023-11-19: 2 via ORAL
  Administered 2023-11-19: 1 via ORAL
  Administered 2023-11-19: 2 via ORAL
  Filled 2023-11-18: qty 2
  Filled 2023-11-18: qty 1
  Filled 2023-11-18: qty 2
  Filled 2023-11-18 (×2): qty 1

## 2023-11-18 MED ORDER — POLYETHYLENE GLYCOL 3350 17 G PO PACK
17.0000 g | PACK | Freq: Once | ORAL | Status: DC
Start: 2023-11-18 — End: 2023-11-20
  Filled 2023-11-18: qty 1

## 2023-11-18 MED ORDER — SODIUM CHLORIDE 0.9 % IV SOLN
INTRAVENOUS | Status: DC
  Administered 2023-11-19: 75 mL via INTRAVENOUS

## 2023-11-18 NOTE — ED Notes (Signed)
 Per night shift pt refused blood draw, pt still refusing nurse attempt to blood draw

## 2023-11-18 NOTE — Inpatient Diabetes Management (Signed)
 Inpatient Diabetes Program Recommendations  AACE/ADA: New Consensus Statement on Inpatient Glycemic Control   Target Ranges:  Prepandial:   less than 140 mg/dL      Peak postprandial:   less than 180 mg/dL (1-2 hours)      Critically ill patients:  140 - 180 mg/dL   Lab Results  Component Value Date   GLUCAP 184 (H) 11/18/2023   HGBA1C 9.1 (H) 11/17/2023    Latest Reference Range & Units 11/18/23 00:06 11/18/23 03:31 11/18/23 08:11  Glucose-Capillary 70 - 99 mg/dL 714 (H) 753 (H) 815 (H)   Review of Glycemic Control  Diabetes history: DM2 Outpatient Diabetes medications: Humalog  10-15 units TID and Lantus  18 units at bedtime.   Current orders for Inpatient glycemic control: Novolog  0-9 units q4hs and Lantus  10 units at bedtime.   Inpatient Diabetes Program Recommendations:   Noted: Lantus  10 units at bedtime was ordered today.  Living Well with Diabetes book ordered.   Will follow.   Lavanda Search, RN, MSN, [facility]  Inpatient Diabetes Coordinator  Pager [phone] (8a-5p)

## 2023-11-18 NOTE — ED Notes (Signed)
 This paramedic unable to pull blood from IV and pt refused blood draw.

## 2023-11-18 NOTE — Progress Notes (Signed)
  Progress Note   Patient: Lori [name] FMW:[id number] DOB: 10/22/1970 DOA: 11/17/2023     1 DOS: the patient was seen and examined on 11/18/2023   Brief hospital course:   53 y.o. female with medical history significant of multiple strokes, CAD sp CBAG, DM2, presented with chest pain from UC. She was found to have dehydration and UTI. Hemodynamically stable. She is WC/Bed bound from previous strokes.  Assessment and Plan:   Acute uncomplicated UTI,POA:  -Symptomatic with dysuria and polyuria -Continue with IV Cefepime  -f/u blood and urine cultures -No evidence of sepsis   Left sided chest pain: In the setting of tachycardia. Resolved now. No acute ischemic EKG changes. Normal Trop. HR has normalized now.  Prior strokes with residual upper extremity contractures, dysarthria and WC/bed bound status: No acute issues PT/OT ordered.  CAD s/p CABG: No acute issues. Continue wwith plavix  75 mg daily  HTN: Monitor Bp closely  Hyperthyroidism: f/u with endocrinologist. She is on methimazole  5 mg daily.  Hyperlipidemia: continue with statin  Insulin  dependent Type 2 DM, uncontrolled, complicated by gastroparesis,POA: Continue with SSI. She was on ozempic  in the past.  Disposition: Home with Husband. May need HHS.        Subjective: She is feeling week. She told me about her prior strokes. She is wheel chair bound and has baseline dysarthria. Denies chest pain or shortness of breath. She lives at home with her husband.  Physical Exam: Vitals:   11/18/23 0530 11/18/23 0710 11/18/23 0715 11/18/23 0730  BP: (!) 126/91  (!) 123/95   Pulse: 82  86 77  Resp: 12  11 11   Temp:      TempSrc:      SpO2: 100%  100% 100%  Weight:  78 kg    Height:  5' 2 (1.575 m)     Constitutional: NAD, calm, comfortable Eyes: PERRL, lids and conjunctivae normal ENMT: Mucous membranes are moist. Posterior pharynx clear of any exudate or lesions.Normal dentition.  Neck:  normal, supple, no masses, no thyromegaly Respiratory: clear to auscultation bilaterally, no wheezing, no crackles. Normal respiratory effort. No accessory muscle use.  Cardiovascular: Regular rate and rhythm, no murmurs / rubs / gallops. No extremity edema. 2+ pedal pulses. No carotid bruits.  Abdomen: no tenderness, no masses palpated. No hepatosplenomegaly. Bowel sounds positive.  Musculoskeletal: no clubbing / cyanosis. No joint deformity upper and lower extremities. Good ROM, upper extremity contractures present. Normal muscle tone.  Skin: no rashes, lesions, ulcers. No induration Neurologic: appears weaker on the left side as compared to the right (prior stroke), baseline dysarthria   Data Reviewed:  There are no new results to review at this time.  Family Communication: None at the bedside  Disposition:HHS Status is: Inpatient Remains inpatient appropriate because: Acute UTI, dehydration  Planned Discharge Destination: Home with Home Health    Time spent: 40 minutes  Author: Deliliah Room, MD 11/18/2023 8:29 AM  For on call review [email or web address].

## 2023-11-18 NOTE — Progress Notes (Signed)
 Echocardiogram 2D Echocardiogram has been performed.  Lori [name] 11/18/2023, 4:15 PM

## 2023-11-18 NOTE — ED Notes (Signed)
 Patient urinary occurrence in brief. Pt changed into new brief and gown. Pt given warm blankets.

## 2023-11-18 NOTE — ED Notes (Signed)
 Pt brief changed.

## 2023-11-19 ENCOUNTER — Other Ambulatory Visit (HOSPITAL_COMMUNITY): Payer: Self-pay

## 2023-11-19 DIAGNOSIS — E1169 Type 2 diabetes mellitus with other specified complication: Secondary | ICD-10-CM

## 2023-11-19 DIAGNOSIS — E66811 Obesity, class 1: Secondary | ICD-10-CM

## 2023-11-19 DIAGNOSIS — E785 Hyperlipidemia, unspecified: Secondary | ICD-10-CM

## 2023-11-19 DIAGNOSIS — N3000 Acute cystitis without hematuria: Secondary | ICD-10-CM | POA: Diagnosis not present

## 2023-11-19 DIAGNOSIS — Z86718 Personal history of other venous thrombosis and embolism: Secondary | ICD-10-CM

## 2023-11-19 DIAGNOSIS — I2510 Atherosclerotic heart disease of native coronary artery without angina pectoris: Secondary | ICD-10-CM | POA: Diagnosis not present

## 2023-11-19 DIAGNOSIS — I10 Essential (primary) hypertension: Secondary | ICD-10-CM | POA: Diagnosis not present

## 2023-11-19 DIAGNOSIS — I5032 Chronic diastolic (congestive) heart failure: Secondary | ICD-10-CM | POA: Diagnosis not present

## 2023-11-19 LAB — GLUCOSE, CAPILLARY
Glucose-Capillary: 111 mg/dL — ABNORMAL HIGH (ref 70–99)
Glucose-Capillary: 185 mg/dL — ABNORMAL HIGH (ref 70–99)
Glucose-Capillary: 198 mg/dL — ABNORMAL HIGH (ref 70–99)
Glucose-Capillary: 215 mg/dL — ABNORMAL HIGH (ref 70–99)
Glucose-Capillary: 278 mg/dL — ABNORMAL HIGH (ref 70–99)

## 2023-11-19 MED ORDER — CEPHALEXIN 500 MG PO CAPS
500.0000 mg | ORAL_CAPSULE | Freq: Two times a day (BID) | ORAL | Status: DC
  Administered 2023-11-19 – 2023-11-20 (×3): 500 mg via ORAL
  Filled 2023-11-19 (×4): qty 1

## 2023-11-19 MED ORDER — INSULIN ASPART 100 UNIT/ML IJ SOLN
0.0000 [IU] | Freq: Three times a day (TID) | INTRAMUSCULAR | Status: DC
  Administered 2023-11-19: 5 [IU] via SUBCUTANEOUS
  Administered 2023-11-19: 2 [IU] via SUBCUTANEOUS
  Administered 2023-11-20: 3 [IU] via SUBCUTANEOUS
  Administered 2023-11-20: 5 [IU] via SUBCUTANEOUS

## 2023-11-19 MED ORDER — ATORVASTATIN CALCIUM 80 MG PO TABS
80.0000 mg | ORAL_TABLET | Freq: Every evening | ORAL | 0 refills | Status: DC
  Filled 2023-11-19: qty 30, 30d supply, fill #0

## 2023-11-19 MED ORDER — OLMESARTAN MEDOXOMIL-HCTZ 40-25 MG PO TABS
1.0000 | ORAL_TABLET | Freq: Every day | ORAL | Status: DC
Start: 2023-11-20 — End: 2023-12-30

## 2023-11-19 MED ORDER — INSULIN GLARGINE-YFGN 100 UNIT/ML ~~LOC~~ SOLN
10.0000 [IU] | Freq: Every day | SUBCUTANEOUS | Status: DC
Start: 2023-11-19 — End: 2023-11-20
  Administered 2023-11-19: 10 [IU] via SUBCUTANEOUS
  Filled 2023-11-19 (×2): qty 0.1

## 2023-11-19 MED ORDER — CEPHALEXIN 500 MG PO CAPS
500.0000 mg | ORAL_CAPSULE | Freq: Two times a day (BID) | ORAL | 0 refills | Status: AC
  Filled 2023-11-19: qty 6, 3d supply, fill #0

## 2023-11-19 NOTE — Assessment & Plan Note (Addendum)
 Continue blood pressure control, continue clopidogrel .  Continue ezetimibe  and resume statin.

## 2023-11-19 NOTE — Assessment & Plan Note (Signed)
Continue home Cpap ?

## 2023-11-19 NOTE — Discharge Summary (Addendum)
 Physician Discharge Summary   Patient: Lori [name] MRN: [id number] DOB: 1971/01/12  Admit date:     11/17/2023  Discharge date: 11/19/23  Discharge Physician: Elidia Sieving Jamear Carbonneau   PCP: Leonarda [name], NP   Recommendations at discharge:    Patient will continue antibiotic therapy with cephalexin  500 mg bid for 3 more days.  Resumed statin therapy.  Follow up with Dinah Ngetich in 7 to 10 days   Discharge Diagnoses: Principal Problem:   UTI (urinary tract infection) Active Problems:   Essential hypertension   Chronic diastolic CHF (congestive heart failure) (HCC)   CAD (coronary artery disease)   OSA on CPAP   Hyperthyroidism   History of CVA with residual deficit   Chronic anemia   Insulin  dependent type 2 diabetes mellitus (HCC)   Obesity, class 1  Resolved Problems:   * No resolved hospital problems. [facility] Course: Mrs. Lori [name] was admitted to the hospital with the working diagnosis of urinary tract infection.   53 y.o. female with medical history significant of multiple strokes, CAD sp CBAG, DM2, who was referred to the ED per urgent care for evaluation of urinary tract infection with sepsis syndrome.  Reported one week of increased urinary frequency, dysuria and generalized weakness.   On her initial physical examination her blood pressure was 105/84, HR 133, RR 16 and 02 saturation 97% Lungs with no wheezing or rhonchi, heart with S1 and S2 present and tachycardic, abdomen with no distention and non tender, no lower extremity edema.   Na 135, K 4.1 Cl 106 bicarbonate 19 glucose 94 bun 19 cr 0,87  AST 17  ALT 18  High sensitive troponin 4  Lactic acid 1.2  Wbc 8,0 hgb 11.5 plt 351  Sars covid 19 negative Influenza negative RSV negative   Urine analysis SG 1,010, protein negative, large leukocytes, small hgb, many bacteria.  21-50 wbc and 0-5 rbc   Chest radiograph with hypoinflation, right rotation, with no infiltrates  or effusions.   EKG 131 bpm, normal axis, normal intervals, sinus rhythm with left atrial enlargement, with no significant ST segment or T wave changes.   Patient was placed on IV fluids and IV antibiotics with improvement in her symptoms.   Assessment and Plan: * Complicated UTI (urinary tract infection) Patient had sepsis syndrome on admission with tachycardia and tachypnea.  She has responded well to IV ceftriaxone . Wbc is 6.3 and she has been afebrile.  Cultures from blood are no growth at the time of her discharge Urine cultures has < 10,000 CFU  Plan to continue antibiotic therapy with Cephalexin  500 mg bid for 3 more days.  Follow up as outpatient.   Essential hypertension Continue blood pressure control with metoprolol .  Resume olmesartan  hydrochlorothiazide .    Chronic diastolic CHF (congestive heart failure) (HCC) Echocardiogram with preserved LV systolic function with EF 55 to 60%, RV systolic function not well visualized, no significant valvular disease.   Patient with no exacerbation Plan to continue metoprolol  and close follow up as outpatient.   OSA on CPAP Continue home Cpap.   CAD (coronary artery disease) No chest pain, no acute coronary syndrome.  Continue clopidogrel , ezetimibe  and metoprolol .   Hyperthyroidism Continue with methimazole .   History of CVA with residual deficit Continue blood pressure control, continue clopidogrel .  Continue ezetimibe  and resume statin.   Chronic anemia Cell count has been stable.   Type 2 diabetes mellitus with hyperlipidemia (HCC) Patient was placed on basal insulin  and  sliding scale during her hospitalization Her glucose remained stable with only one reading above 200 mg/dl.   Continue ezetimibe  and resume statin.   Obesity, class 1 Calculated BMI 31.9   History of DVT (deep vein thrombosis) Continue anticoagulation with rivaroxaban .        Consultants: none  Procedures performed: none   Disposition:  Home Diet recommendation:  Cardiac diet DISCHARGE MEDICATION: Allergies as of 11/19/2023       Reactions   Asa [aspirin ] Anaphylaxis, Swelling   Contrast Media [iodinated Contrast Media] Anaphylaxis, Swelling   Imitrex [sumatriptan] Anaphylaxis   Ms Contin  [morphine ] Anaphylaxis, Swelling   Penicillins Swelling, Other (See Comments)   Mouth and eyes swell   Chlorhexidine  Gluconate [chlorhexidine ] Itching, Rash   Firvanq  [vancomycin ] Other (See Comments)   Red Man's Syndrome   Cipro  [ciprofloxacin  Hcl] Nausea And Vomiting   Nitrofuran Derivatives Anxiety   Wound Dressing Adhesive Itching, Rash        Medication List     STOP taking these medications    fluconazole  150 MG tablet Commonly known as: Diflucan    ranolazine  500 MG 12 hr tablet Commonly known as: RANEXA    Tylenol  PM Extra Strength 500-25 MG Tabs tablet Generic drug: diphenhydramine -acetaminophen        TAKE these medications    acetaminophen  325 MG tablet Commonly known as: TYLENOL  Take 2 tablets (650 mg total) by mouth every 6 (six) hours as needed for mild pain (pain score 1-3) (or Fever >/= 101).   albuterol  108 (90 Base) MCG/ACT inhaler Commonly known as: VENTOLIN  HFA INHALE 2 PUFFS BY MOUTH EVERY 6 HOURS AS NEEDED FOR WHEEZING AND SHORTNESS OF BREATH   atorvastatin  80 MG tablet Commonly known as: LIPITOR  Take 1 tablet (80 mg total) by mouth every evening.   baclofen  10 MG tablet Commonly known as: LIORESAL  Take 1 tablet (10 mg total) by mouth 3 (three) times daily.   cephALEXin  500 MG capsule Commonly known as: KEFLEX  Take 1 capsule (500 mg total) by mouth every 12 (twelve) hours for 3 days.   clopidogrel  75 MG tablet Commonly known as: PLAVIX  TAKE 1 TABLET BY MOUTH DAILY What changed: when to take this   cyclobenzaprine  5 MG tablet Commonly known as: FLEXERIL  Take 1 tablet (5 mg total) by mouth 3 (three) times daily as needed for muscle spasms.   escitalopram  20 MG tablet Commonly  known as: LEXAPRO  TAKE 1 TABLET BY MOUTH EVERY  MORNING   eszopiclone 2 MG Tabs tablet Commonly known as: LUNESTA Take 1 tablet (2 mg total) by mouth at bedtime as needed for sleep. Take immediately before bedtime What changed: when to take this   ezetimibe  10 MG tablet Commonly known as: ZETIA  TAKE 1 TABLET BY MOUTH DAILY What changed: when to take this   FreeStyle Libre 3 Plus Sensor Misc 1 each by Does not apply route continuous. Change every 15 days. What changed:  how much to take how to take this when to take this additional instructions   FreeStyle Libre 3 Plus Sensor Misc Change every 15 days What changed: Another medication with the same name was changed. Make sure you understand how and when to take each.   gabapentin  100 MG capsule Commonly known as: NEURONTIN  Take 1 capsule (100 mg total) by mouth 3 (three) times daily.   HumaLOG  KwikPen 100 UNIT/ML KwikPen Generic drug: insulin  lispro 10-15 units with meals 3 times a day, maximum 45 units/day. What changed:  how much to take how to take this  when to take this additional instructions   Insulin  Pen Needle 32G X 4 MM Misc Use 4x a day   Lantus  SoloStar 100 UNIT/ML Solostar Pen Generic drug: insulin  glargine Inject 18 Units into the skin at bedtime.   methimazole  5 MG tablet Commonly known as: TAPAZOLE  Take 1 tablet (5 mg total) by mouth every morning.   metoCLOPramide  10 MG tablet Commonly known as: REGLAN  Take 1 tablet (10 mg total) by mouth every 6 (six) hours.   metoprolol  tartrate 50 MG tablet Commonly known as: LOPRESSOR  Take 1 tablet (50 mg total) by mouth 2 (two) times daily.   nystatin  100000 UNIT/ML suspension Commonly known as: MYCOSTATIN  Take 5 mLs (500,000 Units total) by mouth 4 (four) times daily.   nystatin  cream Commonly known as: MYCOSTATIN  Apply 1 Application topically 2 (two) times daily. What changed:  when to take this additional instructions    olmesartan -hydrochlorothiazide  40-25 MG tablet Commonly known as: BENICAR  HCT Take 1 tablet by mouth at bedtime. Start taking on: November 20, 2023   ondansetron  4 MG disintegrating tablet Commonly known as: ZOFRAN -ODT Take 4 mg by mouth every 8 (eight) hours as needed for nausea or vomiting (dissolve orally).   pantoprazole  40 MG tablet Commonly known as: PROTONIX  TAKE 1 TABLET BY MOUTH TWICE  DAILY What changed: when to take this   polyethylene glycol powder 17 GM/SCOOP powder Commonly known as: GLYCOLAX /MIRALAX  Take 17 g by mouth daily. What changed: when to take this   senna-docusate 8.6-50 MG tablet Commonly known as: Senokot-S Take 2 tablets by mouth 2 (two) times daily.   sucralfate  1 GM/10ML suspension Commonly known as: CARAFATE  SHAKE LIQUID AND TAKE 10 ML BY MOUTH AT BEDTIME   tamsulosin  0.4 MG Caps capsule Commonly known as: FLOMAX  TAKE 1 CAPSULE BY MOUTH AT BEDTIME   topiramate  25 MG tablet Commonly known as: TOPAMAX  TAKE 1 TABLET BY MOUTH TWICE DAILY What changed: when to take this   Xarelto  10 MG Tabs tablet Generic drug: rivaroxaban  TAKE 1 TABLET BY MOUTH DAILY What changed: when to take this        Discharge Exam: Filed Weights   11/18/23 0710 11/19/23 0016  Weight: 78 kg 79.1 kg   BP 109/70 (BP Location: Left Leg)   Pulse 79   Temp 97.8 F (36.6 C) (Oral)   Resp 15   Ht 5' 2 (1.575 m)   Wt 79.1 kg   SpO2 98%   BMI 31.90 kg/m   Patient is feeling well, with no chest pain, and no dyspnea, no nausea or vomiting, tolerating po well  Neurology awake and alert ENT with mild pallor with no icterus Cardiovascular with S1 and S2 present and regular with no gallops, rubs or murmurs No JVD Respiratory with no rales or wheezing, no rhonchi  Abdomen with no distention  No lower extremity edema   Condition at discharge: stable  The results of significant diagnostics from this hospitalization (including imaging, microbiology, ancillary and  laboratory) are listed below for reference.   Imaging Studies: ECHOCARDIOGRAM COMPLETE Result Date: 11/18/2023    ECHOCARDIOGRAM REPORT   Patient Name:   Lori [name] Date of Exam: 11/18/2023 Medical Rec #:  [id number]                           Height:       62.0 in Accession #:    [id number]  Weight:       172.0 lb Date of Birth:  20-Feb-1970                            BSA:          1.793 m Patient Age:    53 years                            BP:           142/92 mmHg Patient Gender: F                                   HR:           80 bpm. Exam Location:  Inpatient Procedure: 2D Echo, Cardiac Doppler and Color Doppler (Both Spectral and Color            Flow Doppler were utilized during procedure). Indications:    Chest pain  History:        Patient has prior history of Echocardiogram examinations, most                 recent 10/07/2022. CAD, Stroke, Arrythmias:Tachycardia; Risk                 Factors:Dyslipidemia, Hypertension and Diabetes.  Sonographer:    Juliene Rucks Referring Phys: BLEASE QUIVER  Sonographer Comments: Technically challenging study due to limited acoustic windows, Technically difficult study due to poor echo windows, suboptimal apical window, no subcostal window and patient is obese. IMPRESSIONS  1. Left ventricular ejection fraction, by estimation, is 55 to 60%. The left ventricle has normal function. Left ventricular endocardial border not optimally defined to evaluate regional wall motion. Left ventricular diastolic function could not be evaluated.  2. Right ventricular systolic function was not well visualized. The right ventricular size is not well visualized.  3. The mitral valve is grossly normal. Trivial mitral valve regurgitation. No evidence of mitral stenosis.  4. The aortic valve is tricuspid. Aortic valve regurgitation is not visualized. Aortic valve sclerosis/calcification is present, without any evidence of aortic stenosis.  Comparison(s): No significant change from prior study. FINDINGS  Left Ventricle: Left ventricular ejection fraction, by estimation, is 55 to 60%. The left ventricle has normal function. Left ventricular endocardial border not optimally defined to evaluate regional wall motion. The left ventricular internal cavity size was normal in size. There is no left ventricular hypertrophy. Left ventricular diastolic function could not be evaluated due to nondiagnostic images. Left ventricular diastolic function could not be evaluated. Right Ventricle: The right ventricular size is not well visualized. Right vetricular wall thickness was not well visualized. Right ventricular systolic function was not well visualized. Left Atrium: Left atrial size was normal in size. Right Atrium: Right atrial size was not well visualized. Pericardium: Trivial pericardial effusion is present. Mitral Valve: The mitral valve is grossly normal. Trivial mitral valve regurgitation. No evidence of mitral valve stenosis. Tricuspid Valve: The tricuspid valve is grossly normal. Tricuspid valve regurgitation is mild . No evidence of tricuspid stenosis. Aortic Valve: The aortic valve is tricuspid. Aortic valve regurgitation is not visualized. Aortic valve sclerosis/calcification is present, without any evidence of aortic stenosis. Pulmonic Valve: The pulmonic valve was grossly normal. Pulmonic valve regurgitation is not visualized. No evidence of pulmonic stenosis. Aorta: The aortic root and ascending aorta are  structurally normal, with no evidence of dilitation. Venous: The inferior vena cava was not well visualized. IAS/Shunts: The interatrial septum was not well visualized.  LEFT VENTRICLE PLAX 2D LVIDd:         4.40 cm LVIDs:         2.80 cm LV PW:         0.80 cm LV IVS:        0.90 cm LVOT diam:     1.90 cm LV SV:         32 LV SV Index:   18 LVOT Area:     2.84 cm  LEFT ATRIUM         Index LA diam:    3.20 cm 1.78 cm/m  AORTIC VALVE LVOT Vmax:    64.50 cm/s LVOT Vmean:  43.200 cm/s LVOT VTI:    0.114 m  AORTA Ao Root diam: 2.90 cm Ao Asc diam:  2.20 cm MITRAL VALVE               TRICUSPID VALVE MV Area (PHT): 4.29 cm    TR Peak grad:   17.0 mmHg MV Decel Time: 177 msec    TR Vmax:        206.00 cm/s MV E velocity: 60.60 cm/s MV A velocity: 50.00 cm/s  SHUNTS MV E/A ratio:  1.21        Systemic VTI:  0.11 m                            Systemic Diam: 1.90 cm Darryle Decent MD Electronically signed by Darryle Decent MD Signature Date/Time: 11/18/2023/4:49:59 PM    Final    DG Chest Port 1 View Result Date: 11/17/2023 EXAM: 1 VIEW(S) XRAY OF THE CHEST 11/17/2023 05:03:00 PM COMPARISON: 06/07/2023 CLINICAL HISTORY: Questionable sepsis - evaluate for abnormality. Triage notes: Pt [facility] from PCP for possible UTI. Urinary frequency, burning, weakness x1 week, palpitations, tachy. Hx of stroke, Gen weakness BL since. 110/75 97% RA, RR 24, CBG 1130 ETC02 33, 97.8 FINDINGS: LUNGS AND PLEURA: Elevated right hemidiaphragm. No focal pulmonary opacity. No pulmonary edema. No pleural effusion. No pneumothorax. HEART AND MEDIASTINUM: Surgical changes in mediastinum. BONES AND SOFT TISSUES: Sternotomy wires in place. No acute osseous abnormality. IMPRESSION: 1. No acute cardiopulmonary findings. Electronically signed by: Norman Gatlin MD 11/17/2023 05:28 PM EDT RP Workstation: [id number]    Microbiology: Results for orders placed or performed during the hospital encounter of 11/17/23  Resp panel by RT-PCR (RSV, Flu A&B, Covid)     Status: None   Collection Time: 11/17/23  4:53 PM   Specimen: Nasal Swab  Result Value Ref Range Status   SARS Coronavirus 2 by RT PCR NEGATIVE NEGATIVE Final   Influenza A by PCR NEGATIVE NEGATIVE Final   Influenza B by PCR NEGATIVE NEGATIVE Final    Comment: (NOTE) The Xpert Xpress SARS-CoV-2/FLU/RSV plus assay is intended as an aid in the diagnosis of influenza from Nasopharyngeal swab specimens and should not be used as a  sole basis for treatment. Nasal washings and aspirates are unacceptable for Xpert Xpress SARS-CoV-2/FLU/RSV testing.  Fact Sheet for Patients: BloggerCourse.com  Fact Sheet for Healthcare Providers: SeriousBroker.it  This test is not yet approved or cleared by the [place]  FDA and has been authorized for detection and/or diagnosis of SARS-CoV-2 by FDA under an Emergency Use Authorization (EUA). This EUA will remain in effect (meaning this test can be used) for  the duration of the COVID-19 declaration under Section 564(b)(1) of the Act, 21 U.S.C. section 360bbb-3(b)(1), unless the authorization is terminated or revoked.     Resp Syncytial Virus by PCR NEGATIVE NEGATIVE Final    Comment: (NOTE) Fact Sheet for Patients: BloggerCourse.com  Fact Sheet for Healthcare Providers: SeriousBroker.it  This test is not yet approved or cleared by the [place]  FDA and has been authorized for detection and/or diagnosis of SARS-CoV-2 by FDA under an Emergency Use Authorization (EUA). This EUA will remain in effect (meaning this test can be used) for the duration of the COVID-19 declaration under Section 564(b)(1) of the Act, 21 U.S.C. section 360bbb-3(b)(1), unless the authorization is terminated or revoked.  Performed at [facility], [address], [place], [place] [address]   Urine Culture     Status: Abnormal   Collection Time: 11/17/23  4:53 PM   Specimen: Urine, Catheterized  Result Value Ref Range Status   Specimen Description URINE, CATHETERIZED  Final   Special Requests NONE Reflexed from [id number]  Final   Culture (A)  Final    <10,000 COLONIES/mL INSIGNIFICANT GROWTH Performed at [facility], [address] [place], [place] [address]    Report Status 11/18/2023 FINAL  Final  Blood Culture (routine x 2)     Status: None (Preliminary result)   Collection  Time: 11/17/23  6:26 PM   Specimen: BLOOD  Result Value Ref Range Status   Specimen Description BLOOD RIGHT ANTECUBITAL  Final   Special Requests   Final    BOTTLES DRAWN AEROBIC AND ANAEROBIC Blood Culture results may not be optimal due to an inadequate volume of blood received in culture bottles   Culture   Final    NO GROWTH 2 DAYS Performed at [facility], [address] [place], [place] [address]    Report Status PENDING  Incomplete    Labs: CBC: Recent Labs  Lab 11/17/23 1800 11/18/23 1952  WBC 8.0 6.3  NEUTROABS 3.4  --   HGB 11.5* 11.7*  HCT 35.8* 36.6  MCV 78.0* 78.9*  PLT 351 313   Basic Metabolic Panel: Recent Labs  Lab 11/17/23 1800 11/18/23 1952  NA 135 135  K 4.1 4.8  CL 106 106  CO2 19* 20*  GLUCOSE 94 260*  BUN 19 18  CREATININE 0.87 0.95  CALCIUM  9.3 8.7*  MG  --  1.6*  PHOS  --  3.2   Liver Function Tests: Recent Labs  Lab 11/17/23 1800 11/18/23 1952  AST 17 17  ALT 18 18  ALKPHOS 85 85  BILITOT 0.3 0.4  PROT 6.6 6.7  ALBUMIN  3.1* 2.9*   CBG: Recent Labs  Lab 11/18/23 1723 11/18/23 2115 11/19/23 0029 11/19/23 0625 11/19/23 1128  GLUCAP 191* 262* 198* 111* 185*    Discharge time spent: greater than 30 minutes.  Signed: Elidia Toribio Furnace, MD [facility] 11/19/2023

## 2023-11-19 NOTE — Assessment & Plan Note (Signed)
Continue anticoagulation with rivaroxaban 

## 2023-11-19 NOTE — TOC Initial Note (Signed)
 Transition of Care [facility]) - Initial/Assessment Note    Patient Details  Name: Lori [name] MRN: [id number] Date of Birth: [birth year]  [facility] CM/SW Contact:    Waddell Barnie Rama, RN Phone Number: 11/19/2023, 3:48 PM  Clinical Narrative:                 From home with spouse,  patient gives this NCM permission to speak with spouse, has PCP and insurance on file, states has no HH services in place at this time, has walker and w/chair at home.  States will need ambulance transport home  at [employer] and family is support system, states gets medications from [facility] on [address] and [address].  Pta self bed bound, does not ambulate.  NCM asked spouse who is with patient when he is at work, he states no one, states he works a couple hours then go back home.  Patient will need pt/ot eval.    Expected Discharge Plan: Skilled Nursing Facility Barriers to Discharge: Continued Medical Work up   Patient Goals and CMS Choice Patient states their goals for this hospitalization and ongoing recovery are:: get better          Expected Discharge Plan and Services   Discharge Planning Services: CM Consult Post Acute Care Choice: NA Living arrangements for the past 2 months: Single Family Home Expected Discharge Date: 11/19/23               DME Arranged: N/A         HH Arranged: NA          Prior Living Arrangements/Services Living arrangements for the past 2 months: Single Family Home Lives with:: Spouse Patient language and need for interpreter reviewed:: Yes Do you feel safe going back to the place where you live?: Yes      Need for Family Participation in Patient Care: Yes (Comment) Care giver support system in place?: No (comment) Current home services: DME (walker, /w/chair) Criminal Activity/Legal Involvement Pertinent to Current Situation/Hospitalization: No - Comment as needed  Activities of Daily Living   ADL Screening (condition at time  of admission) Independently performs ADLs?: No Does the patient have a NEW difficulty with bathing/dressing/toileting/self-feeding that is expected to last >3 days?: No Does the patient have a NEW difficulty with getting in/out of bed, walking, or climbing stairs that is expected to last >3 days?: No Does the patient have a NEW difficulty with communication that is expected to last >3 days?: No Is the patient deaf or have difficulty hearing?: No Does the patient have difficulty seeing, even when wearing glasses/contacts?: No Does the patient have difficulty concentrating, remembering, or making decisions?: No  Permission Sought/Granted Permission sought to share information with : Case Manager, Family Supports Permission granted to share information with : Yes, Verbal Permission Granted  Share Information with NAME: spouse  Permission granted to share info w AGENCY: HH  Permission granted to share info w Relationship: spouse     Emotional Assessment Appearance:: Appears stated age Attitude/Demeanor/Rapport: Engaged Affect (typically observed): Appropriate Orientation: : Oriented to Self, Oriented to Place, Oriented to  Time, Oriented to Situation Alcohol / Substance Use: Not Applicable Psych Involvement: No (comment)  Admission diagnosis:  Acute cystitis without hematuria [N30.00] Sepsis (HCC) [A41.9] Left-sided chest pain [R07.9] Patient Active Problem List   Diagnosis Date Noted   Sepsis (HCC) 11/17/2023   Complicated UTI (urinary tract infection) 11/17/2023   Gastroparesis 06/06/2023   Abdominal pain 05/30/2023  Intractable nausea and vomiting 05/29/2023   Allergy to radiographic contrast media 05/20/2023   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   Other disorders of orbit 05/20/2023   Gastrointestinal hemorrhage 02/13/2023   Anxiety and depression 02/13/2023   Type 2 diabetes mellitus with hyperlipidemia (HCC) 02/13/2023   Adenomatous duodenal polyp 02/06/2023   Gastric polyp  02/06/2023   COVID-19 virus infection 02/05/2023   Hematemesis 02/04/2023   Anticoagulated 02/04/2023   Urinary incontinence 01/22/2023   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   Cerebrovascular disease 01/03/2023   Thrombocytosis 01/02/2023   OSA on CPAP 01/02/2023   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 12/25/2022   History of DVT (deep vein thrombosis) 12/25/2022   S/P total hysterectomy 12/24/2022   Delirium 11/22/2022   Acute encephalopathy 11/04/2022   Antiplatelet or antithrombotic long-term use 11/04/2022   Hypotension 11/04/2022   Obesity, class 1 10/13/2022   Chronic diastolic CHF (congestive heart failure) (HCC) 10/13/2022   Chronic anemia 10/13/2022   Chest pain of uncertain etiology 10/06/2022   Mild intermittent asthma, uncomplicated 07/04/2022   Lethargic 05/01/2022   Wheelchair dependence 04/08/2022   Dyspnea 11/02/2021   GERD (gastroesophageal reflux disease) 11/02/2021   Nausea and vomiting 08/25/2021   Hypothyroidism 07/10/2021   Functional urinary incontinence 05/17/2021   Diabetic gastroparesis (HCC) 03/16/2021   Migraines 03/16/2021   History of CVA with residual deficit    Debility 02/26/2021   Constipation    Weakness    Aphasia    Tachycardia 01/19/2021   CAD (coronary artery disease) 01/19/2021   Spastic hemiparesis of left 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 (HCC) 01/19/2021   Hyperthyroidism    Essential hypertension    Pheochromocytoma    Hyperlipidemia    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   Disruption of external operation (surgical) wound, not elsewhere classified, subsequent encounter 01/28/2018   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 [place] [address] Phone: [phone] Fax: [phone]     Social Drivers of Health (SDOH) Social History: SDOH Screenings   Food Insecurity: No Food Insecurity (11/18/2023)  Housing: Low Risk  (11/18/2023)  Transportation Needs: No Transportation Needs (11/18/2023)  Utilities: Not At Risk (11/18/2023)  Depression (PHQ2-9): Low Risk  (10/28/2023)  Tobacco Use: Low Risk  (11/18/2023)   SDOH Interventions:     Readmission Risk Interventions    11/19/2023    3:01 PM 06/10/2023    2:47 PM 05/13/2023    3:06 PM  Readmission Risk Prevention Plan  Transportation Screening Complete Complete Complete  Medication Review (RN Care Manager) Complete Referral to Pharmacy Complete  PCP or Specialist appointment within 3-5 days of discharge Complete Complete Complete  HRI or Home Care Consult Complete Complete Complete  SW Recovery Care/Counseling Consult  Complete Complete  Palliative Care Screening Not Applicable Not Applicable Not Applicable  Skilled Nursing Facility Not Applicable Not Applicable Patient Refused

## 2023-11-19 NOTE — Assessment & Plan Note (Signed)
 Cell count has been stable.

## 2023-11-19 NOTE — Assessment & Plan Note (Signed)
 No chest pain, no acute coronary syndrome.  Continue clopidogrel , ezetimibe  and metoprolol .

## 2023-11-19 NOTE — Plan of Care (Signed)
   Problem: Coping: Goal: Ability to adjust to condition or change in health will improve Outcome: Progressing

## 2023-11-19 NOTE — Assessment & Plan Note (Addendum)
 Continue blood pressure control with metoprolol .  Resume olmesartan  hydrochlorothiazide .

## 2023-11-19 NOTE — Assessment & Plan Note (Signed)
 Patient had sepsis syndrome on admission with tachycardia and tachypnea.  She has responded well to IV ceftriaxone . Wbc is 6.3 and she has been afebrile.  Cultures from blood are no growth at the time of her discharge Urine cultures has < 10,000 CFU  Plan to continue antibiotic therapy with Cephalexin  500 mg bid for 3 more days.  Follow up as outpatient.

## 2023-11-19 NOTE — Hospital Course (Addendum)
 Mrs. Cathlean Brazen was admitted to the hospital with the working diagnosis of urinary tract infection.   53 y.o. female with medical history significant of multiple strokes, CAD sp CBAG, DM2, who was referred to the ED per urgent care for evaluation of urinary tract infection with sepsis syndrome.  Reported one week of increased urinary frequency, dysuria and generalized weakness.   On her initial physical examination her blood pressure was 105/84, HR 133, RR 16 and 02 saturation 97% Lungs with no wheezing or rhonchi, heart with S1 and S2 present and tachycardic, abdomen with no distention and non tender, no lower extremity edema.   Na 135, K 4.1 Cl 106 bicarbonate 19 glucose 94 bun 19 cr 0,87  AST 17  ALT 18  High sensitive troponin 4  Lactic acid 1.2  Wbc 8,0 hgb 11.5 plt 351  Sars covid 19 negative Influenza negative RSV negative   Urine analysis SG 1,010, protein negative, large leukocytes, small hgb, many bacteria.  21-50 wbc and 0-5 rbc   Chest radiograph with hypoinflation, right rotation, with no infiltrates or effusions.   EKG 131 bpm, normal axis, normal intervals, sinus rhythm with left atrial enlargement, with no significant ST segment or T wave changes.   Patient was placed on IV fluids and IV antibiotics with improvement in her symptoms.

## 2023-11-19 NOTE — Plan of Care (Signed)
  Problem: Metabolic: Goal: Ability to maintain appropriate glucose levels will improve Outcome: Progressing   Problem: Nutritional: Goal: Maintenance of adequate nutrition will improve Outcome: Progressing   Problem: Clinical Measurements: Goal: Ability to maintain clinical measurements within normal limits will improve Outcome: Progressing

## 2023-11-19 NOTE — Assessment & Plan Note (Addendum)
 Patient was placed on basal insulin  and sliding scale during her hospitalization Her glucose remained stable with only one reading above 200 mg/dl.   Continue ezetimibe  and resume statin.

## 2023-11-19 NOTE — Inpatient Diabetes Management (Signed)
 Inpatient Diabetes Program Recommendations  AACE/ADA: New Consensus Statement on Inpatient Glycemic Control (2015)  Target Ranges:  Prepandial:   less than 140 mg/dL      Peak postprandial:   less than 180 mg/dL (1-2 hours)      Critically ill patients:  140 - 180 mg/dL   Lab Results  Component Value Date   GLUCAP 185 (H) 11/19/2023   HGBA1C 9.1 (H) 11/17/2023    Inpatient Diabetes Program Recommendations:   Spoke with pt. Regarding A1c of 9.1 (average blood glucose of 214 over the past 2-3 months).  Thank you, Aastha Dayley E. Lekeith Wulf, RN, MSN, CNS, CDCES  Diabetes Coordinator Inpatient Glycemic Control Team Team Pager [phone] (8am-5pm) 11/19/2023 1:54 PM

## 2023-11-19 NOTE — Assessment & Plan Note (Signed)
Hyperthyroidism  Continue with methimazole.  

## 2023-11-19 NOTE — Assessment & Plan Note (Signed)
 Calculated BMI 31.9

## 2023-11-19 NOTE — Assessment & Plan Note (Signed)
 Echocardiogram with preserved LV systolic function with EF 55 to 60%, RV systolic function not well visualized, no significant valvular disease.   Patient with no exacerbation Plan to continue metoprolol  and close follow up as outpatient.

## 2023-11-20 LAB — GLUCOSE, CAPILLARY
Glucose-Capillary: 211 mg/dL — ABNORMAL HIGH (ref 70–99)
Glucose-Capillary: 286 mg/dL — ABNORMAL HIGH (ref 70–99)

## 2023-11-20 MED ORDER — DIPHENHYDRAMINE HCL 25 MG PO CAPS
25.0000 mg | ORAL_CAPSULE | Freq: Once | ORAL | Status: AC
  Administered 2023-11-20: 25 mg via ORAL
  Filled 2023-11-20: qty 1

## 2023-11-20 NOTE — TOC Transition Note (Addendum)
 Transition of Care [facility]) - Discharge Note   Patient Details  Name: Lori [name] MRN: [id number] Date of Birth: 1970/07/20  Transition of Care [facility]) CM/SW Contact:  Waddell Barnie Rama, RN Phone Number: 11/20/2023, 1:41 PM   Clinical Narrative:    For dc today, NCM spoke with patient and her spouse, offered choice for HH , they have no preference,  referral made thru portal to [facility] for HHRN,HHPT, HHOT, HHAIDE and SW.  Also patient would like referral sent to [facility] for [facility] services,  this NCM faxed expedited referral for [facility] services to [facility].  One hear back from them will inform them that patient would like [facility].  [facility]tar has been scheduled for pickup, they will be here between 3:00 and 3:30.    Final next level of care: (P) Home w Home Health Services Barriers to Discharge: Continued Medical Work up   Patient Goals and CMS Choice Patient states their goals for this hospitalization and ongoing recovery are:: get better          Discharge Placement                       Discharge Plan and Services Additional resources added to the After Visit Summary for     Discharge Planning Services: CM Consult Post Acute Care Choice: NA          DME Arranged: N/A         HH Arranged: NA          Social Drivers of Health (SDOH) Interventions SDOH Screenings   Food Insecurity: No Food Insecurity (11/18/2023)  Housing: Low Risk  (11/18/2023)  Transportation Needs: No Transportation Needs (11/18/2023)  Utilities: Not At Risk (11/18/2023)  Depression (PHQ2-9): Low Risk  (10/28/2023)  Tobacco Use: Low Risk  (11/18/2023)     Readmission Risk Interventions    11/19/2023    3:01 PM 06/10/2023    2:47 PM 05/13/2023    3:06 PM  Readmission Risk Prevention Plan  Transportation Screening Complete Complete Complete  Medication Review Oceanographer) Complete Referral to Pharmacy Complete  PCP or Specialist  appointment within 3-5 days of discharge Complete Complete Complete  HRI or Home Care Consult Complete Complete Complete  SW Recovery Care/Counseling Consult  Complete Complete  Palliative Care Screening Not Applicable Not Applicable Not Applicable  Skilled Nursing Facility Not Applicable Not Applicable Patient Refused

## 2023-11-20 NOTE — Plan of Care (Signed)
  Problem: Education: Goal: Ability to describe self-care measures that may prevent or decrease complications (Diabetes Survival Skills Education) will improve Outcome: Adequate for Discharge Goal: Individualized Educational Video(s) Outcome: Adequate for Discharge   Problem: Coping: Goal: Ability to adjust to condition or change in health will improve Outcome: Adequate for Discharge   Problem: Fluid Volume: Goal: Ability to maintain a balanced intake and output will improve Outcome: Adequate for Discharge   Problem: Health Behavior/Discharge Planning: Goal: Ability to identify and utilize available resources and services will improve Outcome: Adequate for Discharge Goal: Ability to manage health-related needs will improve Outcome: Adequate for Discharge   Problem: Metabolic: Goal: Ability to maintain appropriate glucose levels will improve Outcome: Adequate for Discharge   Problem: Nutritional: Goal: Maintenance of adequate nutrition will improve Outcome: Adequate for Discharge Goal: Progress toward achieving an optimal weight will improve Outcome: Adequate for Discharge   Problem: Skin Integrity: Goal: Risk for impaired skin integrity will decrease Outcome: Adequate for Discharge   Problem: Tissue Perfusion: Goal: Adequacy of tissue perfusion will improve Outcome: Adequate for Discharge   Problem: Fluid Volume: Goal: Hemodynamic stability will improve Outcome: Adequate for Discharge   Problem: Clinical Measurements: Goal: Diagnostic test results will improve Outcome: Adequate for Discharge Goal: Signs and symptoms of infection will decrease Outcome: Adequate for Discharge   Problem: Respiratory: Goal: Ability to maintain adequate ventilation will improve Outcome: Adequate for Discharge   Problem: Education: Goal: Knowledge of General Education information will improve Description: Including pain rating scale, medication(s)/side effects and non-pharmacologic  comfort measures Outcome: Adequate for Discharge   Problem: Health Behavior/Discharge Planning: Goal: Ability to manage health-related needs will improve Outcome: Adequate for Discharge   Problem: Clinical Measurements: Goal: Ability to maintain clinical measurements within normal limits will improve Outcome: Adequate for Discharge Goal: Will remain free from infection Outcome: Adequate for Discharge Goal: Diagnostic test results will improve Outcome: Adequate for Discharge Goal: Respiratory complications will improve Outcome: Adequate for Discharge Goal: Cardiovascular complication will be avoided Outcome: Adequate for Discharge   Problem: Activity: Goal: Risk for activity intolerance will decrease Outcome: Adequate for Discharge   Problem: Nutrition: Goal: Adequate nutrition will be maintained Outcome: Adequate for Discharge   Problem: Coping: Goal: Level of anxiety will decrease Outcome: Adequate for Discharge   Problem: Elimination: Goal: Will not experience complications related to bowel motility Outcome: Adequate for Discharge Goal: Will not experience complications related to urinary retention Outcome: Adequate for Discharge   Problem: Pain Managment: Goal: General experience of comfort will improve and/or be controlled Outcome: Adequate for Discharge   Problem: Safety: Goal: Ability to remain free from injury will improve Outcome: Adequate for Discharge   Problem: Skin Integrity: Goal: Risk for impaired skin integrity will decrease Outcome: Adequate for Discharge

## 2023-11-20 NOTE — Evaluation (Addendum)
 Physical Therapy Evaluation Patient Details Name: Lori [name] MRN: [id number] DOB: Apr 13, 1970 Today's Date: 11/20/2023  History of Present Illness  53 yo F adm 11/17/23 with UTI. PMHx: HTN, HLD, GERD, DM, CHF, anemia, CAD s/p CABG, CVA with Lt hemiplegia, anxiety/depression, migraines, asthma, DVT  Clinical Impression  Pt pleasant with flat affect, decreased clarity of speech, left hemiparesis who has assist of spouse and P.T. at baseline for mobility. Pt reports 22 CVAs from 2014-2023 and denies any desire for post acute rehab or hoyer lift. Pt has WC, hospital bed, platform RW at home and uses purewick and adult briefs. Spouse works and comes home to fix her meals and assist with positioning. Pt states she gets in [facility] 1x/wk and gets up to [facility] every 3 days for BM with assist. Pt without assist during the day when spouse gone as PCA just stopped coming 1 week ago due to lack of Medicaid payment. Pt would benefit from additional help at home, PCA, hoyer lift and HHPT. Will follow acutely to maximize mobility, safety and function.      HR 129 SPO2 96% on RA    If plan is discharge home, recommend the following: A little help with walking and/or transfers;A lot of help with bathing/dressing/bathroom;Assistance with feeding;Assistance with cooking/housework;Assist for transportation   Can travel by private vehicle        Equipment Recommendations Hoyer lift  Recommendations for Other Services    PCA   Functional Status Assessment Patient has had a recent decline in their functional status and/or demonstrates limited ability to make significant improvements in function in a reasonable and predictable amount of time     Precautions / Restrictions Precautions Precautions: Fall;Other (comment) Recall of Precautions/Restrictions: Intact Precaution/Restrictions Comments: left hemiparesis, watch HR      Mobility  Bed Mobility Overal bed mobility: Needs Assistance Bed  Mobility: Supine to Sit, Sit to Supine     Supine to sit: Min assist Sit to supine: Mod assist   General bed mobility comments: min assist to fully pivot LB and elevate trunk from surface, cues to scoot to EOB. mod assist to bring legs to surface to return to bed (pt denied up to chair). Mod assist to roll bil for pad positioning    Transfers Overall transfer level: Needs assistance   Transfers: Sit to/from Stand Sit to Stand: Min assist           General transfer comment: min assist to power up from bed and chair x 3 total trials, cues for sequence and positioning    Ambulation/Gait Ambulation/Gait assistance: Min assist Gait Distance (Feet): 8 Feet Assistive device: Rolling walker (2 wheels) Gait Pattern/deviations: Step-through pattern, Decreased stride length   Gait velocity interpretation: <1.8 ft/sec, indicate of risk for recurrent falls   General Gait Details: physical assist to maintain LUE at RW and direct RW as pt normally uses left platform RW. pt able to step through with decreased step length on LLE. Pt with HR 129 with limited gait and seated rest after 8' with repeated gait additional 8' after seated rest  Stairs            Wheelchair Mobility     Tilt Bed    Modified Rankin (Stroke Patients Only)       Balance Overall balance assessment: Needs assistance Sitting-balance support: No upper extremity supported, Feet supported Sitting balance-Leahy Scale: Fair Sitting balance - Comments: EOB with supervision   Standing balance support: Bilateral upper extremity supported, During  functional activity, Reliant on assistive device for balance Standing balance-Leahy Scale: Poor Standing balance comment: Rw and assist for standing                             Pertinent Vitals/Pain Pain Assessment Pain Assessment: No/denies pain    Home Living Family/patient expects to be discharged to:: Private residence Living Arrangements:  Spouse/significant other Available Help at Discharge: Family;Available PRN/intermittently Type of Home: Apartment Home Access: Level entry       Home Layout: One level Home Equipment: Hospital bed;Wheelchair - Engineering geologist (2 wheels);BSC/3in1      Prior Function Prior Level of Function : Needs assist       Physical Assist : ADLs (physical);Mobility (physical) Mobility (physical): Bed mobility;Transfers ADLs (physical): Grooming;Bathing;Dressing;Toileting;IADLs Mobility Comments: spouse assists with pivot to [facility] 1x/week, assist with rolling and transfers. denied desire for hoyer, air mattress ADLs Comments: assist for all ADLs, uses purewick and brief at home. 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    Lower Extremity Assessment Lower Extremity Assessment: LLE deficits/detail LLE Deficits / Details: grossly 2/5    Cervical / Trunk Assessment Cervical / Trunk Assessment: Normal  Communication   Communication Communication: Impaired Factors Affecting Communication: Reduced clarity of speech    Cognition Arousal: Alert Behavior During Therapy: Flat affect   PT - Cognitive impairments: Memory                         Following commands: Intact       Cueing Cueing Techniques: Verbal cues, Gestural cues     General Comments      Exercises     Assessment/Plan    PT Assessment Patient needs continued PT services  PT Problem List Decreased strength;Decreased range of motion;Decreased activity tolerance;Decreased balance;Decreased mobility;Decreased knowledge of use of DME;Decreased safety awareness       PT Treatment Interventions DME instruction;Gait training;Functional mobility training;Therapeutic activities;Therapeutic  exercise;Patient/family education;Neuromuscular re-education;Balance training    PT Goals (Current goals can be found in the Care Plan section)  Acute Rehab PT Goals Patient Stated Goal: return home, watch tv PT Goal Formulation: With patient Time For Goal Achievement: 12/04/23 Potential to Achieve Goals: Fair    Frequency Min 1X/week     Co-evaluation               AM-PAC PT 6 Clicks Mobility  Outcome Measure Help needed turning from your back to your side while in a flat bed without using bedrails?: A Little Help needed moving from lying on your back to sitting on the side of a flat bed without using bedrails?: A Little Help needed moving to and from a bed to a chair (including a wheelchair)?: A Little Help needed standing up from a chair using your arms (e.g., wheelchair or bedside chair)?: A Little Help needed to walk in hospital room?: A Lot Help needed climbing 3-5 steps with a railing? : Total 6 Click Score: 15    End of Session Equipment Utilized During Treatment: Gait belt Activity Tolerance: Patient tolerated treatment well Patient left: in bed;with call bell/phone within reach;with bed alarm set Nurse Communication: Mobility status PT Visit Diagnosis: Other abnormalities of gait and mobility (R26.89);Muscle weakness (generalized) (M62.81);Difficulty in walking,  not elsewhere classified (R26.2)    Time: 9085-9058 PT Time Calculation (min) (ACUTE ONLY): 27 min   Charges:   PT Evaluation $PT Eval Moderate Complexity: 1 Mod PT Treatments $Therapeutic Activity: 8-22 mins PT General Charges $$ ACUTE PT VISIT: 1 Visit         Lenoard SQUIBB, PT Acute Rehabilitation Services Office: [phone]   Lenoard NOVAK Jarreau Callanan 11/20/2023, 11:37 AM

## 2023-11-20 NOTE — TOC Progression Note (Addendum)
 Transition of Care [facility]) - Progression Note    Patient Details  Name: Lori [name] MRN: [id number] Date of Birth: 1970/11/09  Transition of Care [facility]) CM/SW Contact  Luise JAYSON Pan, [place] Phone Number: 11/20/2023, 11:13 AM  Clinical Narrative:   CSW awaiting PT/OT eval. CSW reached out to [place] and [place] with Financial Counseling about if patient has ltc Medicaid.   11:51 AM Per PT, patient rec for [facility] as patient declined SNF and hoyer lift equipment. Per PT note, patient has WC, hospital bed, platform RW at home and uses purewick and adult briefs. Patient reported spouse fixes her meals and assists with positioning. Patient reported to PT that she gerts up to [facility]. CSW notified RNCM.   1:27 PM Per FC, patient has medicaid and her plan only covers the cost of the premium payments for Medicare and no other services.  CSW will continue to follow.    Expected Discharge Plan: Skilled Nursing Facility Barriers to Discharge: Continued Medical Work up               Expected Discharge Plan and Services   Discharge Planning Services: CM Consult Post Acute Care Choice: NA Living arrangements for the past 2 months: Single Family Home Expected Discharge Date: 11/19/23               DME Arranged: N/A         HH Arranged: NA           Social Drivers of Health (SDOH) Interventions SDOH Screenings   Food Insecurity: No Food Insecurity (11/18/2023)  Housing: Low Risk  (11/18/2023)  Transportation Needs: No Transportation Needs (11/18/2023)  Utilities: Not At Risk (11/18/2023)  Depression (PHQ2-9): Low Risk  (10/28/2023)  Tobacco Use: Low Risk  (11/18/2023)    Readmission Risk Interventions    11/19/2023    3:01 PM 06/10/2023    2:47 PM 05/13/2023    3:06 PM  Readmission Risk Prevention Plan  Transportation Screening Complete Complete Complete  Medication Review Oceanographer) Complete Referral to Pharmacy Complete  PCP or Specialist  appointment within 3-5 days of discharge Complete Complete Complete  HRI or Home Care Consult Complete Complete Complete  SW Recovery Care/Counseling Consult  Complete Complete  Palliative Care Screening Not Applicable Not Applicable Not Applicable  Skilled Nursing Facility Not Applicable Not Applicable Patient Refused

## 2023-11-20 NOTE — Evaluation (Signed)
 Occupational Therapy Evaluation Patient Details Name: Lori [name] MRN: [id number] DOB: [birth year] Today's Date: 11/20/2023   History of Present Illness   53 yo F adm 11/17/23 with UTI. PMHx: HTN, HLD, GERD, DM, CHF, anemia, CAD s/p CABG, CVA with Lt hemiplegia, anxiety/depression, migraines, asthma, DVT     Clinical Impressions At baseline, pt completes ADLs largely with Set up to Total assist and requires assistance for bed mobility and functional transfers/mobility with a w/c. Pt now presents near baseline PLOF, but with decreased activity tolerance and decreased safety and independence with ADLs. Pt currently demonstrating ability to complete UB ADLs largely with Set up to Max assist, LB bathing with Total assist, and bed mobility with Min to Mod assist. Pt VSS on RA. Pt will benefit from acute OT services to address deficits and increase safety and independence with functional tasks. Post acute discharge, pt will benefit from continued assistance of family paired with [facility] OT to maximize rehab potential and decrease risk of rehospitalization. Pt would also benefit from increased assistance of family/friends in the home, PCA services, and mechanical lift for increased safety in the home.      If plan is discharge home, recommend the following:   A lot of help with bathing/dressing/bathroom;A lot of help with walking and/or transfers;Assistance with cooking/housework;Assist for transportation;Help with stairs or ramp for entrance;Direct supervision/assist for financial management;Direct supervision/assist for medications management     Functional Status Assessment   Patient has had a recent decline in their functional status and demonstrates the ability to make significant improvements in function in a reasonable and predictable amount of time.     Equipment Recommendations   None recommended by OT (Pt would benefit from hoyer-style lift but declines mechanical  lift at this time. Pt already has all other needed equipment)     Recommendations for Other Services         Precautions/Restrictions   Precautions Precautions: Fall;Other (comment) Recall of Precautions/Restrictions: Intact Precaution/Restrictions Comments: left hemiparesis, watch HR Restrictions Weight Bearing Restrictions Per Provider Order: No     Mobility Bed Mobility Overal bed mobility: Needs Assistance Bed Mobility: Supine to Sit, Sit to Supine, Rolling Rolling: Min assist, Used rails   Supine to sit: Min assist Sit to supine: Mod assist   General bed mobility comments: min assist to fully pivot LB and elevate trunk from surface, cues to scoot to EOB. mod assist to bring legs to surface to return to bed (pt denied up to chair). Mod assist to roll bil for pad positioning    Transfers Overall transfer level: Needs assistance                 General transfer comment: pt declined further mobility this session      Balance Overall balance assessment: Needs assistance Sitting-balance support: No upper extremity supported, Feet supported Sitting balance-Leahy Scale: Fair Sitting balance - Comments: EOB with supervision                                   ADL either performed or assessed with clinical judgement   ADL Overall ADL's : Needs assistance/impaired Eating/Feeding: Set up;Bed level   Grooming: Set up;Contact guard assist;Minimal assistance;Bed level   Upper Body Bathing: Moderate assistance;Bed level   Lower Body Bathing: Total assistance;Bed level   Upper Body Dressing : Moderate assistance;Maximal assistance;Bed level   Lower Body Dressing: Total assistance;Bed level  Toilet Transfer Details (indicate cue type and reason): pt declined OOB transfers this session Toileting- Clothing Manipulation and Hygiene: Total assistance;Bed level         General ADL Comments: Pt with decreased activity tolerance     Vision  Baseline Vision/History: 1 Wears glasses Ability to See in Adequate Light: 0 Adequate (with glasses) Patient Visual Report: No change from baseline Additional Comments: [facility] for tasks assessed with glasses on; not formally screened or evaluated     Perception         Praxis         Pertinent Vitals/Pain Pain Assessment Pain Assessment: No/denies pain     Extremity/Trunk Assessment Upper Extremity Assessment Upper Extremity Assessment: Right hand dominant;LUE deficits/detail (R UE overall WFL for task assessed) LUE Deficits / Details: 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; minimal active movement in wrist with no other active movement in L UE at baseline; pt reports HH OT recently ordered a resting hand splint to address contracture with this OT encouraging pt to conitue with use of splint with HH OT supervision/assistance once she returns home LUE Sensation: decreased light touch;decreased proprioception LUE Coordination: decreased fine motor;decreased gross motor   Lower Extremity Assessment Lower Extremity Assessment: Defer to PT evaluation LLE Deficits / Details: grossly 2/5   Cervical / Trunk Assessment Cervical / Trunk Assessment: Normal   Communication Communication Communication: Impaired Factors Affecting Communication: Reduced clarity of speech   Cognition Arousal: Alert Behavior During Therapy: Flat affect Cognition: Cognition impaired, No family/caregiver present to determine baseline     Awareness: Intellectual awareness intact, Online awareness intact (intermittent online awareness) Memory impairment (select all impairments): Working memory Attention impairment (select first level of impairment): Alternating attention Executive functioning impairment (select all impairments): Problem solving, Organization OT - Cognition Comments: Pt AAOx4 with cognition largely [facility] but with deficits noted above. Pt with decreased safety awareness.                  Following commands: Intact       Cueing  General Comments   Cueing Techniques: Verbal cues;Visual cues;Gestural cues  VSS on AR; RN entering at end of session   Exercises     Shoulder Instructions      Home Living Family/patient expects to be discharged to:: Private residence Living Arrangements: Spouse/significant other Available Help at Discharge: Family;Available PRN/intermittently Type of Home: Apartment Home Access: Level entry     Home Layout: One level     Bathroom Shower/Tub: Chief Strategy Officer: Standard Bathroom Accessibility: Yes How Accessible: Accessible via walker;Accessible via wheelchair Home Equipment: Hospital bed;Wheelchair - Engineering geologist (2 wheels);BSC/3in1   Additional Comments: Pt had a home health aide, but states aide is not longer coming due to insurance issues. Pt reprots she is interesting in getting another PCA, but is unsure if this will be possible      Prior Functioning/Environment Prior Level of Function : Needs assist             Mobility Comments: spouse assists with pivot to WC and tub bench 1x/week and to [facility] every 3 days, assist with rolling and transfers. denied desire for hoyer, air mattress ADLs Comments: Pt completes UB ADLs with Set up to Mod assist and LB ADLs with Max to Total assist, uses purewick and brief at home and transfers to the [facility] for a BM every three days. PCA stopped coming 1 week ago due to lack of Medicaid payment  OT Problem List: Decreased activity tolerance;Decreased range of motion;Impaired balance (sitting and/or standing);Decreased safety awareness   OT Treatment/Interventions: Self-care/ADL training;Therapeutic exercise;DME and/or AE instruction;Therapeutic activities;Cognitive remediation/compensation;Patient/family education;Balance training      OT Goals(Current goals can be found in the care plan section)   Acute Rehab OT Goals Patient Stated  Goal: to return home and continue with [facility] OT and HH PT OT Goal Formulation: With patient Time For Goal Achievement: 12/04/23 Potential to Achieve Goals: Good ADL Goals Pt Will Perform Grooming: with set-up;with supervision;sitting Pt Will Perform Upper Body Bathing: with contact guard assist;with min assist;sitting Pt Will Perform Upper Body Dressing: with min assist;sitting Pt Will Transfer to Toilet: with min assist;ambulating;bedside commode (with least restrictive AD) Pt Will Perform Toileting - Clothing Manipulation and hygiene: with max assist;sit to/from stand   OT Frequency:  Min 1X/week    Co-evaluation              AM-PAC OT 6 Clicks Daily Activity     Outcome Measure Help from another person eating meals?: A Little Help from another person taking care of personal grooming?: A Little Help from another person toileting, which includes using toliet, bedpan, or urinal?: Total Help from another person bathing (including washing, rinsing, drying)?: A Lot Help from another person to put on and taking off regular upper body clothing?: A Lot Help from another person to put on and taking off regular lower body clothing?: Total 6 Click Score: 12   End of Session Nurse Communication: Mobility status;Other (comment) (OT POC)  Activity Tolerance: Patient tolerated treatment well Patient left: in bed;with call bell/phone within reach;with nursing/sitter in room  OT Visit Diagnosis: Other abnormalities of gait and mobility (R26.89);Unsteadiness on feet (R26.81);Other (comment) (decreased activity tolerance)                Time: 8659-8645 OT Time Calculation (min): 14 min Charges:  OT General Charges $OT Visit: 1 Visit OT Evaluation $OT Eval Low Complexity: 1 Low  Margarie Rockey HERO., OTR/L, MA Acute Rehab [phone]   Margarie FORBES Horns 11/20/2023, 2:13 PM

## 2023-11-21 ENCOUNTER — Telehealth: Payer: Self-pay

## 2023-11-21 NOTE — Telephone Encounter (Signed)
 NCM received phone call from [employer] , stating they can not handle this request for PCS, unfortunately her Medicaid does not cover PCS services , she has Medicare primary and her Medicaid only covers Medicare part B. So she does not have PCS coverage.  This NCM will call to let the patient know also. NCM called patient to make her aware.

## 2023-11-22 LAB — CULTURE, BLOOD (ROUTINE X 2): Culture: NO GROWTH

## 2023-11-23 ENCOUNTER — Other Ambulatory Visit: Payer: Self-pay | Admitting: Family

## 2023-11-23 DIAGNOSIS — E0590 Thyrotoxicosis, unspecified without thyrotoxic crisis or storm: Secondary | ICD-10-CM

## 2023-11-24 ENCOUNTER — Telehealth: Payer: Self-pay

## 2023-11-24 ENCOUNTER — Telehealth: Payer: Self-pay | Admitting: *Deleted

## 2023-11-24 NOTE — Telephone Encounter (Signed)
 Dr. [place] refills this medication.

## 2023-11-24 NOTE — Telephone Encounter (Signed)
 Please see below.  Copied from CRM #[id number]. Topic: General - Other >> Nov 24, 2023  2:30 PM Suzette B wrote: Reason for CRM: From previous Reason for Contact - Medical Advice: Reason for CRM: [phone] (M) Ms. [facility], patient is needing assistance with getting an aid or admission to nursing for assistance. She is unable to assist herself. Please call patient to advise    ----------------------------------------------------------------------- From previous Reason for Contact - Medical Advice: Reason for CRM: [phone] (M) Ms. [facility], patient is needing assistance with getting an aid or admission to nursing for assistance. She is unable to assist herself. Please call patient to advise

## 2023-11-24 NOTE — Telephone Encounter (Signed)
 Verbal orders were given to [facility] with [facility].

## 2023-11-24 NOTE — Progress Notes (Signed)
 Provider: Roxan Plough FNP-C  Lynze Reddy, [name], NP  Patient Care Team: Muaaz Brau C, NP as PCP - General (Family Medicine) Thukkani, Arun K, MD as PCP - Cardiology (Cardiology) [facility] Thapa, [place], MD as Consulting Physician (Endocrinology)  Extended Emergency Contact Information Primary Emergency Contact: [name],[name] Mobile Phone: [phone] Relation: Spouse Preferred language: English Interpreter needed? No Secondary Emergency Contact: [name],[name] Home Phone: [phone] Mobile Phone: [phone] Relation: Daughter Mother: [facility] Phone: [phone] Mobile Phone: [phone]  Code Status: Full Code  Goals of care: Advanced Directive information    11/18/2023    2:00 PM  Advanced Directives  Would patient like information on creating a medical advance directive? No - Patient declined     Chief Complaint  Patient presents with   Burning with urination.    Discussed the use of AI scribe software for clinical note transcription with the patient, who gave verbal consent to proceed.  History of Present Illness   Lori [name] is a 53 year old female who presents with increased urinary frequency and tachycardia.  She experiences increased urinary frequency and some difficulty with urination. A urine specimen taken two weeks ago showed mixed bacteria. She is unable to provide a new specimen today.  She feels intermittently feverish with chills. She has a history of thyroid  issues and confirms taking her thyroid  medication as prescribed.  She experiences occasional lower back pain but no abdominal pain. She reports feeling dry and acknowledges not drinking enough plain water , preferring flavored drinks like Propel and sodas.  She mentions experiencing a little itching but no discharge. She requests nystatin  cream for irritation under her breasts.  Her medication regimen includes leflunomide  and baclofen .   Past Medical History:  Diagnosis Date   Acute bronchitis 08/24/2021   Acute cystitis 12/26/2022   Acute metabolic encephalopathy 11/04/2022   Acute renal failure (ARF) 04/27/2023   AKI (acute kidney injury) 04/30/2022   Aspiration pneumonia (HCC) 10/13/2022   CAD (coronary artery disease)    Cerebral infarction, unspecified (HCC) 09/27/2012   Chest pain of uncertain etiology 10/06/2022   Depression    Diabetes mellitus without complication (HCC)    Diabetic hyperosmolar non-ketotic state (HCC) 01/19/2023   DKA (diabetic ketoacidosis) (HCC) 02/19/2021   History of CT scan    History of mammogram    History of MRI    Hypertension    Pheochromocytoma    Reversible cerebrovascular vasoconstriction syndrome    Spell of abnormal behavior    Stroke [facility])    Thyroid  disease    Past Surgical History:  Procedure Laterality Date   ABDOMINAL HYSTERECTOMY     BIOPSY  02/06/2023   Procedure: BIOPSY;  Surgeon: Leigh Elspeth SQUIBB, MD;  Location: [facility] ENDOSCOPY;  Service: Gastroenterology;;   CESAREAN SECTION     3   CORONARY ARTERY BYPASS GRAFT     ESOPHAGOGASTRODUODENOSCOPY (EGD) WITH PROPOFOL  N/A 02/06/2023   Procedure: ESOPHAGOGASTRODUODENOSCOPY (EGD) WITH PROPOFOL ;  Surgeon: Leigh Elspeth SQUIBB, MD;  Location: [facility] ENDOSCOPY;  Service: Gastroenterology;  Laterality: N/A;   Right adrenal gland removal for pheochromocytoma Right     Allergies  Allergen Reactions   Asa [Aspirin ] Anaphylaxis and Swelling   Contrast Media [Iodinated Contrast Media] Anaphylaxis and Swelling   Imitrex [Sumatriptan] Anaphylaxis   Ms Contin  [Morphine ] Anaphylaxis and Swelling   Penicillins Swelling and Other (See Comments)    Mouth and eyes swell   Chlorhexidine  Gluconate [Chlorhexidine ] Itching and Rash   Firvanq  [Vancomycin ] Other (See  Comments)    Red Man's Syndrome   Cipro  [Ciprofloxacin  Hcl] Nausea And Vomiting   Nitrofuran Derivatives Anxiety   Wound Dressing Adhesive Itching and Rash     Outpatient Encounter Medications as of 11/12/2023  Medication Sig   acetaminophen  (TYLENOL ) 325 MG tablet Take 2 tablets (650 mg total) by mouth every 6 (six) hours as needed for mild pain (pain score 1-3) (or Fever >/= 101).   albuterol  (VENTOLIN  HFA) 108 (90 Base) MCG/ACT inhaler INHALE 2 PUFFS BY MOUTH EVERY 6 HOURS AS NEEDED FOR WHEEZING AND SHORTNESS OF BREATH   clopidogrel  (PLAVIX ) 75 MG tablet TAKE 1 TABLET BY MOUTH DAILY (Patient taking differently: Take 75 mg by mouth in the morning.)   Continuous Glucose Sensor (FREESTYLE LIBRE 3 PLUS SENSOR) MISC 1 each by Does not apply route continuous. Change every 15 days. (Patient taking differently: Inject 1 Device into the skin See admin instructions. Place 1 new sensor into the skin every 15 days)   Continuous Glucose Sensor (FREESTYLE LIBRE 3 PLUS SENSOR) MISC Change every 15 days (Patient not taking: Reported on 11/17/2023)   cyclobenzaprine  (FLEXERIL ) 5 MG tablet Take 1 tablet (5 mg total) by mouth 3 (three) times daily as needed for muscle spasms.   escitalopram  (LEXAPRO ) 20 MG tablet TAKE 1 TABLET BY MOUTH EVERY  MORNING   eszopiclone (LUNESTA) 2 MG TABS tablet Take 1 tablet (2 mg total) by mouth at bedtime as needed for sleep. Take immediately before bedtime (Patient taking differently: Take 2 mg by mouth at bedtime. Take immediately before bedtime)   ezetimibe  (ZETIA ) 10 MG tablet TAKE 1 TABLET BY MOUTH DAILY (Patient taking differently: Take 10 mg by mouth at bedtime.)   gabapentin  (NEURONTIN ) 100 MG capsule Take 1 capsule (100 mg total) by mouth 3 (three) times daily.   HUMALOG  KWIKPEN 100 UNIT/ML KwikPen 10-15 units with meals 3 times a day, maximum 45 units/day. (Patient taking differently: Inject 10-15 Units into the skin 3 (three) times daily with meals.)   insulin  glargine (LANTUS  SOLOSTAR) 100 UNIT/ML Solostar Pen Inject 18 Units into the skin at bedtime.   Insulin  Pen Needle 32G X 4 MM MISC Use 4x a day   methimazole  (TAPAZOLE )  5 MG tablet Take 1 tablet (5 mg total) by mouth every morning.   metoCLOPramide  (REGLAN ) 10 MG tablet Take 1 tablet (10 mg total) by mouth every 6 (six) hours.   metoprolol  tartrate (LOPRESSOR ) 50 MG tablet Take 1 tablet (50 mg total) by mouth 2 (two) times daily.   nystatin  (MYCOSTATIN ) 100000 UNIT/ML suspension Take 5 mLs (500,000 Units total) by mouth 4 (four) times daily. (Patient not taking: Reported on 11/17/2023)   nystatin  cream (MYCOSTATIN ) Apply 1 Application topically 2 (two) times daily. (Patient taking differently: Apply 1 Application topically See admin instructions. Apply under both breasts once a day)   pantoprazole  (PROTONIX ) 40 MG tablet TAKE 1 TABLET BY MOUTH TWICE  DAILY (Patient taking differently: Take 40 mg by mouth 2 (two) times daily before a meal.)   polyethylene glycol powder (GLYCOLAX /MIRALAX ) 17 GM/SCOOP powder Take 17 g by mouth daily. (Patient taking differently: Take 17 g by mouth in the morning and at bedtime.)   senna-docusate (SENOKOT-S) 8.6-50 MG tablet Take 2 tablets by mouth 2 (two) times daily.   sucralfate  (CARAFATE ) 1 GM/10ML suspension SHAKE LIQUID AND TAKE 10 ML BY MOUTH AT BEDTIME   tamsulosin  (FLOMAX ) 0.4 MG CAPS capsule TAKE 1 CAPSULE BY MOUTH AT BEDTIME   topiramate  (TOPAMAX ) 25 MG tablet  TAKE 1 TABLET BY MOUTH TWICE DAILY (Patient taking differently: Take 25 mg by mouth in the morning and at bedtime.)   XARELTO  10 MG TABS tablet TAKE 1 TABLET BY MOUTH DAILY (Patient taking differently: Take 10 mg by mouth in the morning.)   [DISCONTINUED] atorvastatin  (LIPITOR ) 80 MG tablet Take 1 tablet (80 mg total) by mouth every evening. (Patient not taking: Reported on 11/17/2023)   [DISCONTINUED] baclofen  (LIORESAL ) 10 MG tablet Take 1 tablet (10 mg total) by mouth 3 (three) times daily.   [DISCONTINUED] fluconazole  (DIFLUCAN ) 150 MG tablet Take one tablet by mouth x 1 dose then repeat in one week   [DISCONTINUED] olmesartan -hydrochlorothiazide  (BENICAR  HCT) 40-25  MG tablet Take 1 tablet by mouth daily. (Patient taking differently: Take 1 tablet by mouth at bedtime.)   [DISCONTINUED] ranolazine  (RANEXA ) 500 MG 12 hr tablet Take 1 tablet (500 mg total) by mouth 2 (two) times daily. (Patient not taking: Reported on 11/17/2023)   baclofen  (LIORESAL ) 10 MG tablet Take 1 tablet (10 mg total) by mouth 3 (three) times daily.   [DISCONTINUED] Continuous Glucose Sensor (FREESTYLE LIBRE 14 DAY SENSOR) MISC 1 each by Does not apply route as needed. DX: E11.69   No facility-administered encounter medications on file as of 11/12/2023.    Review of Systems  Constitutional:  Negative for appetite change, chills, fatigue, fever and unexpected weight change.  HENT:  Negative for congestion, dental problem, ear discharge, ear pain, facial swelling, hearing loss, nosebleeds, postnasal drip, rhinorrhea, sinus pressure, sinus pain, sneezing, sore throat, tinnitus and trouble swallowing.   Eyes:  Negative for pain, discharge, redness, itching and visual disturbance.  Respiratory:  Negative for cough, chest tightness, shortness of breath and wheezing.   Cardiovascular:  Negative for chest pain, palpitations and leg swelling.  Gastrointestinal:  Negative for abdominal distention, abdominal pain, blood in stool, constipation, diarrhea, nausea and vomiting.  Endocrine: Negative for cold intolerance, heat intolerance, polydipsia, polyphagia and polyuria.  Genitourinary:  Positive for frequency. Negative for difficulty urinating, dysuria, flank pain and urgency.  Musculoskeletal:  Negative for arthralgias, back pain, gait problem, joint swelling, myalgias, neck pain and neck stiffness.  Skin:  Negative for color change, pallor, rash and wound.  Neurological:  Positive for numbness. Negative for dizziness, syncope, speech difficulty, weakness, light-headedness and headaches.       Left hand contracted  Hematological:  Does not bruise/bleed easily.  Psychiatric/Behavioral:  Negative  for agitation, behavioral problems, confusion, hallucinations, self-injury, sleep disturbance and suicidal ideas. The patient is not nervous/anxious.     Immunization History  Administered Date(s) Administered   Influenza-Unspecified 11/04/2016   PPD Test 03/19/2016   Pfizer(Comirnaty)Fall Seasonal Vaccine 12 years and older 09/29/2019, 03/31/2020   Tdap 02/07/2014   Pertinent  Health Maintenance Due  Topic Date Due   FOOT EXAM  Never done   Mammogram  Never done   Colonoscopy  Never done   Influenza Vaccine  06/15/2024 (Originally 09/05/2023)   OPHTHALMOLOGY EXAM  02/19/2024   HEMOGLOBIN A1C  05/17/2024      06/13/2023   10:06 AM 07/29/2023   10:40 AM 10/10/2023    9:31 AM 10/27/2023    1:21 PM 10/28/2023    3:21 PM  Fall Risk  Falls in the past year? 0 Exclusion - non ambulatory 0 0 0  Was there an injury with Fall? 0   0 0  Fall Risk Category Calculator 0   0 0  Patient at Risk for Falls Due to   Impaired  balance/gait;Impaired mobility No Fall Risks   Fall risk Follow up    Falls evaluation completed    Functional Status Survey:    Vitals:   11/12/23 1316  BP: 128/72  Pulse: (!) 128  Resp: 19  Temp: 97.8 F (36.6 C)  SpO2: 98%  Weight: 171 lb 12.8 oz (77.9 kg)  Height: 5' 2 (1.575 m)   Body mass index is 31.42 kg/m. Physical Exam  VITALS: T- 97.8, P- 128, BP- 128/72, SaO2- 98% GENERAL: Alert, cooperative, well developed, no acute distress. HEENT: Normocephalic, normal oropharynx, dry oral mucosa. CHEST: Clear to auscultation bilaterally, no wheezes, rhonchi, or crackles. CARDIOVASCULAR: Normal heart rate and rhythm, S1 and S2 normal without murmurs. ABDOMEN: Soft, non-tender, non-distended, without organomegaly, normal bowel sounds. EXTREMITIES: No cyanosis, edema, or swelling. NEUROLOGICAL: Cranial nerves grossly intact, moves all extremities without gross motor or sensory deficit.      Labs reviewed: Recent Labs    01/02/23 0440 01/03/23 1104  04/29/23 0519 04/30/23 0520 05/11/23 0851 05/12/23 0529 05/13/23 0453 05/15/23 1126 10/27/23 1407 11/17/23 1800 11/18/23 1952  NA 130*   < > 144   < > 135   < > 131*   < > 136 135 135  K 3.3*   < > 3.8   < > 2.9*   < > 3.2*   < > 4.0 4.1 4.8  CL 97*   < > 110   < > 102   < > 102   < > 101 106 106  CO2 20*   < > 25   < > 22   < > 23   < > 24 19* 20*  GLUCOSE 235*   < > 103*   < > 204*   < > 180*   < > 138 94 260*  BUN 8   < > 25*   < > 10   < > 10   < > 29* 19 18  CREATININE 0.77   < > 1.46*   < > 0.72   < > 0.80   < > 1.28* 0.87 0.95  CALCIUM  8.5*   < > 9.1   < > 8.4*   < > 7.9*   < > 9.2 9.3 8.7*  MG 1.7   < > 1.7  --  1.4*  --  1.7  --   --   --  1.6*  PHOS 3.3  --  3.5  --   --   --   --   --   --   --  3.2   < > = values in this interval not displayed.   Recent Labs    06/15/23 0744 10/27/23 1407 11/17/23 1800 11/18/23 1952  AST 14* 14 17 17   ALT 11 15 18 18   ALKPHOS 60  --  85 85  BILITOT 0.6 0.4 0.3 0.4  PROT 6.9 7.6 6.6 6.7  ALBUMIN  3.3*  --  3.1* 2.9*   Recent Labs    06/27/23 1459 10/27/23 1407 11/17/23 1800 11/18/23 1952  WBC 7.7 8.8 8.0 6.3  NEUTROABS 4,397 5,166 3.4  --   HGB 13.1 13.5 11.5* 11.7*  HCT 43.2 43.6 35.8* 36.6  MCV 87.1 80.9 78.0* 78.9*  PLT 443* 429* 351 313   Lab Results  Component Value Date   TSH 0.26 (L) 11/12/2023   Lab Results  Component Value Date   HGBA1C 9.1 (H) 11/17/2023   Lab Results  Component Value Date   CHOL 158 10/27/2023  HDL 57 10/27/2023   LDLCALC 81 10/27/2023   TRIG 105 10/27/2023   CHOLHDL 2.8 10/27/2023    Significant Diagnostic Results in last 30 days:  ECHOCARDIOGRAM COMPLETE Result Date: 11/18/2023    ECHOCARDIOGRAM REPORT   Patient Name:   Lori [name] Date of Exam: 11/18/2023 Medical Rec #:  [id number]                           Height:       62.0 in Accession #:    [id number]                          Weight:       172.0 lb Date of Birth:  Oct 26, 1970                             BSA:          1.793 m Patient Age:    53 years                            BP:           142/92 mmHg Patient Gender: F                                   HR:           80 bpm. Exam Location:  Inpatient Procedure: 2D Echo, Cardiac Doppler and Color Doppler (Both Spectral and Color            Flow Doppler were utilized during procedure). Indications:    Chest pain  History:        Patient has prior history of Echocardiogram examinations, most                 recent 10/07/2022. CAD, Stroke, Arrythmias:Tachycardia; Risk                 Factors:Dyslipidemia, Hypertension and Diabetes.  Sonographer:    Juliene Rucks Referring Phys: BLEASE QUIVER  Sonographer Comments: Technically challenging study due to limited acoustic windows, Technically difficult study due to poor echo windows, suboptimal apical window, no subcostal window and patient is obese. IMPRESSIONS  1. Left ventricular ejection fraction, by estimation, is 55 to 60%. The left ventricle has normal function. Left ventricular endocardial border not optimally defined to evaluate regional wall motion. Left ventricular diastolic function could not be evaluated.  2. Right ventricular systolic function was not well visualized. The right ventricular size is not well visualized.  3. The mitral valve is grossly normal. Trivial mitral valve regurgitation. No evidence of mitral stenosis.  4. The aortic valve is tricuspid. Aortic valve regurgitation is not visualized. Aortic valve sclerosis/calcification is present, without any evidence of aortic stenosis. Comparison(s): No significant change from prior study. FINDINGS  Left Ventricle: Left ventricular ejection fraction, by estimation, is 55 to 60%. The left ventricle has normal function. Left ventricular endocardial border not optimally defined to evaluate regional wall motion. The left ventricular internal cavity size was normal in size. There is no left ventricular hypertrophy. Left ventricular diastolic function could  not be evaluated due to nondiagnostic images. Left ventricular diastolic function could not be evaluated. Right Ventricle: The right ventricular size is not well visualized. Right vetricular wall  thickness was not well visualized. Right ventricular systolic function was not well visualized. Left Atrium: Left atrial size was normal in size. Right Atrium: Right atrial size was not well visualized. Pericardium: Trivial pericardial effusion is present. Mitral Valve: The mitral valve is grossly normal. Trivial mitral valve regurgitation. No evidence of mitral valve stenosis. Tricuspid Valve: The tricuspid valve is grossly normal. Tricuspid valve regurgitation is mild . No evidence of tricuspid stenosis. Aortic Valve: The aortic valve is tricuspid. Aortic valve regurgitation is not visualized. Aortic valve sclerosis/calcification is present, without any evidence of aortic stenosis. Pulmonic Valve: The pulmonic valve was grossly normal. Pulmonic valve regurgitation is not visualized. No evidence of pulmonic stenosis. Aorta: The aortic root and ascending aorta are structurally normal, with no evidence of dilitation. Venous: The inferior vena cava was not well visualized. IAS/Shunts: The interatrial septum was not well visualized.  LEFT VENTRICLE PLAX 2D LVIDd:         4.40 cm LVIDs:         2.80 cm LV PW:         0.80 cm LV IVS:        0.90 cm LVOT diam:     1.90 cm LV SV:         32 LV SV Index:   18 LVOT Area:     2.84 cm  LEFT ATRIUM         Index LA diam:    3.20 cm 1.78 cm/m  AORTIC VALVE LVOT Vmax:   64.50 cm/s LVOT Vmean:  43.200 cm/s LVOT VTI:    0.114 m  AORTA Ao Root diam: 2.90 cm Ao Asc diam:  2.20 cm MITRAL VALVE               TRICUSPID VALVE MV Area (PHT): 4.29 cm    TR Peak grad:   17.0 mmHg MV Decel Time: 177 msec    TR Vmax:        206.00 cm/s MV E velocity: 60.60 cm/s MV A velocity: 50.00 cm/s  SHUNTS MV E/A ratio:  1.21        Systemic VTI:  0.11 m                            Systemic Diam: 1.90 cm  Darryle Decent MD Electronically signed by Darryle Decent MD Signature Date/Time: 11/18/2023/4:49:59 PM    Final    DG Chest Port 1 View Result Date: 11/17/2023 EXAM: 1 VIEW(S) XRAY OF THE CHEST 11/17/2023 05:03:00 PM COMPARISON: 06/07/2023 CLINICAL HISTORY: Questionable sepsis - evaluate for abnormality. Triage notes: Pt [facility]b [facility] from PCP for possible UTI. Urinary frequency, burning, weakness x1 week, palpitations, tachy. Hx of stroke, Gen weakness BL since. 110/75 97% RA, RR 24, CBG 1130 ETC02 33, 97.8 FINDINGS: LUNGS AND PLEURA: Elevated right hemidiaphragm. No focal pulmonary opacity. No pulmonary edema. No pleural effusion. No pneumothorax. HEART AND MEDIASTINUM: Surgical changes in mediastinum. BONES AND SOFT TISSUES: Sternotomy wires in place. No acute osseous abnormality. IMPRESSION: 1. No acute cardiopulmonary findings. Electronically signed by: Norman Gatlin MD 11/17/2023 05:28 PM EDT RP Workstation: [id number]    Assessment/Plan  Sinus tachycardia and dehydration Sinus tachycardia with a heart rate of 128 bpm, likely exacerbated by dehydration. She reports insufficient water  intake, preferring flavored drinks. Declined hospital visit for IV fluids due to difficulty with IV access and personal preference. Advised on risks of persistent tachycardia, including potential myocardial infarction, and the importance of hydration. -  Perform EKG to confirm sinus tachycardia - Encourage increased hydration with at least eight glasses of water  or Propel daily - Advise against soda consumption due to potential impact on blood sugar and hydration - Discuss potential need for hospital visit for IV fluids if symptoms persist - Monitor heart rate at home and seek medical attention if it remains elevated  Urinary frequency and lower back pain, possible urinary tract infection Increased urinary frequency and lower back pain suggestive of a possible urinary tract infection. Previous urine culture showed  mixed bacteria, indicating possible contamination. Instructed on proper urine collection to avoid contamination. - Provide urine collection supplies for home collection to avoid contamination - Instruct on proper midstream urine collection technique - Advise to return urine specimen for analysis  Intertrigo and cutaneous candidiasis under breast Intertrigo and cutaneous candidiasis under the breast causing irritation. - Prescribe nystatin  cream for application to affected areas - Send prescription to [facility] on [address]  Possible vaginal candidiasis Reports mild itching without discharge, suggestive of possible vaginal candidiasis. - Prescribe Diflucan , one pill now and another in one week - Send prescription to [facility] on [address] Communication: Reviewed plan of care with patient verbalized understanding   Labs/tests ordered:  - CBC/diff - CMP - Urine culture  - EKG   Next Appointment: Return if symptoms worsen or fail to improve.   Total time: 22 minutes. Greater than 50% of total time spent doing patient education regarding T2DM,HTN, HLD,chronic back pain,health maintenance including symptom/medication management.   Roxan JAYSON Plough, NP

## 2023-11-24 NOTE — Telephone Encounter (Signed)
 Okay to give verbal orders to [facility] health per [facility] policy.

## 2023-11-24 NOTE — Transitions of Care (Post Inpatient/ED Visit) (Signed)
   11/24/2023  Name: Lori [name] MRN: [id number] DOB: 1970/12/04  Today's TOC FU Call Status: Today's TOC FU Call Status:: Unsuccessful Call (1st Attempt) Unsuccessful Call (1st Attempt) Date: 11/24/23  Attempted to reach the patient regarding the most recent Inpatient/ED visit.  Follow Up Plan: Additional outreach attempts will be made to reach the patient to complete the Transitions of Care (Post Inpatient/ED visit) call.   Shona Prow RN, CCM [place]  VBCI-Population Health RN Care Manager [phone]

## 2023-11-24 NOTE — Telephone Encounter (Signed)
 Copied from CRM #[id number]. Topic: General - Other >> Nov 24, 2023 12:13 PM Susanna ORN wrote: Reason for CRM: Powell, nurse with [facility], called in stating that she admitted patient to home health services today. She's needing Dr. Roxan Ngetich to sign the orders. States she just need a verbal ok from her. Please give her a call back at [phone]. She states a message can be left as it's a secure line.    Called Heather with [facility] and LM with Verbal orders

## 2023-11-25 ENCOUNTER — Telehealth: Payer: Self-pay

## 2023-11-25 DIAGNOSIS — I69351 Hemiplegia and hemiparesis following cerebral infarction affecting right dominant side: Secondary | ICD-10-CM | POA: Diagnosis not present

## 2023-11-25 NOTE — Transitions of Care (Post Inpatient/ED Visit) (Signed)
   11/25/2023  Name: Julissa Browning [name] MRN: [id number] DOB: 09-16-1970  Today's TOC FU Call Status: Today's TOC FU Call Status:: Unsuccessful Call (2nd Attempt) Unsuccessful Call (2nd Attempt) Date: 11/25/23  Attempted to reach the patient regarding the most recent Inpatient/ED visit.  Follow Up Plan: Additional outreach attempts will be made to reach the patient to complete the Transitions of Care (Post Inpatient/ED visit) call.   Shona Prow RN, CCM [place]  VBCI-Population Health RN Care Manager [phone]

## 2023-11-26 ENCOUNTER — Ambulatory Visit: Admitting: Family

## 2023-11-26 ENCOUNTER — Telehealth: Payer: Self-pay

## 2023-11-26 ENCOUNTER — Encounter: Payer: Self-pay | Admitting: Family

## 2023-11-26 VITALS — BP 138/88 | HR 112 | Temp 97.9°F | Resp 20 | Ht 62.0 in | Wt 174.0 lb

## 2023-11-26 DIAGNOSIS — F32A Depression, unspecified: Secondary | ICD-10-CM

## 2023-11-26 DIAGNOSIS — Z794 Long term (current) use of insulin: Secondary | ICD-10-CM

## 2023-11-26 DIAGNOSIS — R2681 Unsteadiness on feet: Secondary | ICD-10-CM

## 2023-11-26 DIAGNOSIS — R112 Nausea with vomiting, unspecified: Secondary | ICD-10-CM

## 2023-11-26 DIAGNOSIS — E1169 Type 2 diabetes mellitus with other specified complication: Secondary | ICD-10-CM

## 2023-11-26 DIAGNOSIS — E0590 Thyrotoxicosis, unspecified without thyrotoxic crisis or storm: Secondary | ICD-10-CM | POA: Diagnosis not present

## 2023-11-26 DIAGNOSIS — Z8673 Personal history of transient ischemic attack (TIA), and cerebral infarction without residual deficits: Secondary | ICD-10-CM

## 2023-11-26 DIAGNOSIS — Z86718 Personal history of other venous thrombosis and embolism: Secondary | ICD-10-CM

## 2023-11-26 DIAGNOSIS — I10 Essential (primary) hypertension: Secondary | ICD-10-CM | POA: Diagnosis not present

## 2023-11-26 DIAGNOSIS — J4520 Mild intermittent asthma, uncomplicated: Secondary | ICD-10-CM

## 2023-11-26 DIAGNOSIS — F419 Anxiety disorder, unspecified: Secondary | ICD-10-CM

## 2023-11-26 LAB — CBC WITH DIFFERENTIAL/PLATELET
Absolute Lymphocytes: 2492 {cells}/uL (ref 850–3900)
Absolute Monocytes: 433 {cells}/uL (ref 200–950)
Basophils Absolute: 43 {cells}/uL (ref 0–200)
Basophils Relative: 0.6 %
Eosinophils Absolute: 170 {cells}/uL (ref 15–500)
Eosinophils Relative: 2.4 %
HCT: 38.5 % (ref 35.0–45.0)
Hemoglobin: 11.9 g/dL (ref 11.7–15.5)
MCH: 25.1 pg — ABNORMAL LOW (ref 27.0–33.0)
MCHC: 30.9 g/dL — ABNORMAL LOW (ref 32.0–36.0)
MCV: 81.2 fL (ref 80.0–100.0)
MPV: 10.1 fL (ref 7.5–12.5)
Monocytes Relative: 6.1 %
Neutro Abs: 3962 {cells}/uL (ref 1500–7800)
Neutrophils Relative %: 55.8 %
Platelets: 373 Thousand/uL (ref 140–400)
RBC: 4.74 Million/uL (ref 3.80–5.10)
RDW: 16.9 % — ABNORMAL HIGH (ref 11.0–15.0)
Total Lymphocyte: 35.1 %
WBC: 7.1 Thousand/uL (ref 3.8–10.8)

## 2023-11-26 LAB — COMPREHENSIVE METABOLIC PANEL WITH GFR
AG Ratio: 1.3 (calc) (ref 1.0–2.5)
ALT: 16 U/L (ref 6–29)
AST: 12 U/L (ref 10–35)
Albumin: 3.9 g/dL (ref 3.6–5.1)
Alkaline phosphatase (APISO): 94 U/L (ref 37–153)
BUN: 19 mg/dL (ref 7–25)
CO2: 25 mmol/L (ref 20–32)
Calcium: 9.8 mg/dL (ref 8.6–10.4)
Chloride: 102 mmol/L (ref 98–110)
Creat: 0.91 mg/dL (ref 0.50–1.03)
Globulin: 3 g/dL (ref 1.9–3.7)
Glucose, Bld: 213 mg/dL — ABNORMAL HIGH (ref 65–99)
Potassium: 4.5 mmol/L (ref 3.5–5.3)
Sodium: 137 mmol/L (ref 135–146)
Total Bilirubin: 0.3 mg/dL (ref 0.2–1.2)
Total Protein: 6.9 g/dL (ref 6.1–8.1)
eGFR: 76 mL/min/1.73m2 (ref >=60)

## 2023-11-26 LAB — MAGNESIUM: Magnesium: 1.8 mg/dL (ref 1.5–2.5)

## 2023-11-26 MED ORDER — SEMAGLUTIDE (1 MG/DOSE) 4 MG/3ML ~~LOC~~ SOPN: 1.0000 mg | PEN_INJECTOR | SUBCUTANEOUS | 3 refills | Status: DC

## 2023-11-26 MED ORDER — METOCLOPRAMIDE HCL 10 MG PO TABS: 10.0000 mg | ORAL_TABLET | Freq: Four times a day (QID) | ORAL | 3 refills | Status: DC

## 2023-11-26 MED ORDER — ONDANSETRON 4 MG PO TBDP: 4.0000 mg | ORAL_TABLET | Freq: Three times a day (TID) | ORAL | 3 refills | Status: DC | PRN

## 2023-11-26 NOTE — Transitions of Care (Post Inpatient/ED Visit) (Signed)
   11/26/2023  Name: Lori [name] [name] MRN: [id number] DOB: 02/26/1970  Today's TOC FU Call Status: Today's TOC FU Call Status:: Unsuccessful Call (3rd Attempt) Unsuccessful Call (3rd Attempt) Date: 11/26/23  Attempted to reach the patient regarding the most recent Inpatient/ED visit.  Follow Up Plan: No further outreach attempts will be made at this time. We have been unable to contact the patient.  Shona Prow RN, CCM [place]  VBCI-Population Health RN Care Manager [phone]

## 2023-11-27 DIAGNOSIS — J690 Pneumonitis due to inhalation of food and vomit: Secondary | ICD-10-CM | POA: Diagnosis not present

## 2023-11-27 DIAGNOSIS — I5022 Chronic systolic (congestive) heart failure: Secondary | ICD-10-CM | POA: Diagnosis not present

## 2023-12-01 ENCOUNTER — Telehealth: Payer: Self-pay

## 2023-12-01 DIAGNOSIS — Z8673 Personal history of transient ischemic attack (TIA), and cerebral infarction without residual deficits: Secondary | ICD-10-CM

## 2023-12-01 DIAGNOSIS — I639 Cerebral infarction, unspecified: Secondary | ICD-10-CM

## 2023-12-01 DIAGNOSIS — R3981 Functional urinary incontinence: Secondary | ICD-10-CM

## 2023-12-01 DIAGNOSIS — E1169 Type 2 diabetes mellitus with other specified complication: Secondary | ICD-10-CM

## 2023-12-01 NOTE — Telephone Encounter (Signed)
 Home health orders placed for Nursing and Nurse Aide.

## 2023-12-01 NOTE — Addendum Note (Signed)
 Addended byBETHA LEONARDA BURDOCK C on: 12/01/2023 03:41 PM   Modules accepted: Orders

## 2023-12-01 NOTE — Telephone Encounter (Signed)
 Copied from CRM #[id number]. Topic: General - Other >> Dec 01, 2023  2:08 PM Susanna ORN wrote: Reason for CRM: Patient called in along with another party on the line requesting to speak with Dr. Roxan. Advised that she's currently in with a patient at the moment. Mrs. Starlene asked to leave a message on her voicemail. Not sure if Dr. Roxan has a voicemail set up & I called [facility] twice and no one answered. Upon returning back to speak with Mrs. Becton, the call dropped. Placed call back to Mrs. Becton and got no answer. >> Dec 01, 2023  2:20 PM Fredrica W wrote: Patient called back. Call was dropped. Calling to get Home Health. Fax sent today from [facility] regarding this. [name] with [facility] was on the line and is requesting call back from provider. States Medicaid stopped providing home health for patient who was getting it 7 days per week 3 hours per day. Now has no one and is home alone with no help. Thinks it may cause other problems. [facility]  Medicare requires request through physician line - medically necessary. Thank You

## 2023-12-02 ENCOUNTER — Telehealth: Payer: Self-pay

## 2023-12-02 NOTE — Telephone Encounter (Signed)
 Spoke with [name] and stating that the patient she was getting 7 days a week and 3 hours due to getting help with Home Health with assisting and patient is requesting for hour in the home as Mrs. [name] stated in represent for the patient behalf and want the asked if the care can start immediately. Mrs [name] also stated that she did speak to the insurance [employer] and they also stating the patient will need help in order of the approval of the insurance and diagnoses code also risk of skin tear and the break down of skin. Also with the patient recently begin in the hospital and that she will have a zero co-pay if she is approve for home health care.

## 2023-12-02 NOTE — Telephone Encounter (Signed)
 Copied from CRM [id number]. Topic: Clinical - Prescription Issue >> Dec 02, 2023  5:39 PM Debby BROCKS wrote: Reason for CRM: Patient states that someone from her [facility] pharmacy contacted her stating they were out of stock from eszopiclone (LUNESTA) 2 MG TABS tablet. Patient would like to know if there is an alternate medication she can take

## 2023-12-03 ENCOUNTER — Telehealth: Payer: Self-pay

## 2023-12-03 NOTE — Progress Notes (Signed)
   This encounter was created in error - please disregard. No show

## 2023-12-03 NOTE — Telephone Encounter (Signed)
 Call returned to [name] and verbal orders authorized per [employer] standing order

## 2023-12-03 NOTE — Telephone Encounter (Signed)
 Copied from CRM [id number]. Topic: Clinical - Home Health Verbal Orders >> Dec 03, 2023  1:39 PM [name] wrote: Caller/Agency: Signe from [facility]  Callback Number: [phone] Service Requested: Occupational Therapy Frequency: 1 week 8 Any new concerns about the patient? No

## 2023-12-04 ENCOUNTER — Ambulatory Visit: Payer: Self-pay | Admitting: Family

## 2023-12-04 NOTE — Telephone Encounter (Signed)
 I looked into the patient I did found that the referral for the patient has been done through [facility]

## 2023-12-04 NOTE — Telephone Encounter (Signed)
 Start date depends on Insurance clearance and Home health.Orders already placed.

## 2023-12-04 NOTE — Telephone Encounter (Signed)
 Left a detail voicemail in regards the response of Ngetich, Dinah C, NP stating the start date depends on Insurance clearance and Home health.

## 2023-12-07 ENCOUNTER — Other Ambulatory Visit: Payer: Self-pay | Admitting: Family

## 2023-12-07 DIAGNOSIS — G8114 Spastic hemiplegia affecting left nondominant side: Secondary | ICD-10-CM

## 2023-12-08 ENCOUNTER — Telehealth: Payer: Self-pay

## 2023-12-08 NOTE — Telephone Encounter (Signed)
 Call returned to [name] and verbal orders authorized per [employer] standing order

## 2023-12-08 NOTE — Telephone Encounter (Signed)
 Copied from CRM [id number]. Topic: Clinical - Home Health Verbal Orders >> Dec 08, 2023  1:03 PM Farrel B wrote: Caller/Agency: [facility] Patrcia Rushing Number: [phone] Service Requested: Physical Therapy Frequency: 2 week @ 2 and 1 week @ 5 Any new concerns about the patient? No

## 2023-12-09 ENCOUNTER — Encounter: Payer: Self-pay | Admitting: Physical Medicine & Rehabilitation

## 2023-12-09 ENCOUNTER — Encounter: Attending: Physical Medicine & Rehabilitation | Admitting: Physical Medicine & Rehabilitation

## 2023-12-09 ENCOUNTER — Telehealth: Payer: Self-pay

## 2023-12-09 ENCOUNTER — Other Ambulatory Visit: Payer: Self-pay | Admitting: Family

## 2023-12-09 VITALS — BP 115/80 | HR 129 | Ht 62.0 in | Wt 155.0 lb

## 2023-12-09 DIAGNOSIS — I639 Cerebral infarction, unspecified: Secondary | ICD-10-CM | POA: Diagnosis present

## 2023-12-09 DIAGNOSIS — G43019 Migraine without aura, intractable, without status migrainosus: Secondary | ICD-10-CM

## 2023-12-09 DIAGNOSIS — G8114 Spastic hemiplegia affecting left nondominant side: Secondary | ICD-10-CM | POA: Diagnosis present

## 2023-12-09 NOTE — Progress Notes (Signed)
 Subjective:    Patient ID: Lori [name], female    DOB: 27-Aug-1970, 52 y.o.   MRN: [id number]  HPI Discussed the use of AI scribe software for clinical note transcription with the patient, who gave verbal consent to proceed.  History of Present Illness Lori [name] is a 53 year old female who presents for a follow-up after receiving Botox  injections six weeks ago.  Six weeks post-Botox  treatment, she reports minimal improvement in elbow flexor spasticity. The finger flexors are functioning well, but the thumb remains abnormally positioned.  Botox  was administered in the palm, targeting thumb muscles. She experiences pain in some areas, and there is a need to adjust Botox  distribution in future sessions. She is currently on the maximum dose of Botox .    Last botulinum toxin injection 10/28/2023, here to assess efficacy Total dose 400U LEFT Biceps50 2+ Brachialis 75 1+ ECRL 50 2+  FDS50 2+ FDP50 1+ Opponens pollicis 25 2+ Lumbricals 75 0-1+ PT 25 2+ Pain Inventory Average Pain 0 Pain Right Now 0 My pain is .  In the last 24 hours, has pain interfered with the following? General activity 2 Relation with others 8 Enjoyment of life 8 What TIME of day is your pain at its worst? night Sleep (in general) Fair  Pain is worse with: some activites Pain improves with: rest Relief from Meds: .  Family History  Problem Relation Age of Onset   Hypertension Mother    Diabetes Mother    Atrial fibrillation Mother    Hypertension Father    Heart attack Father    Dementia Father    Diabetes Brother    Brain cancer Brother    Asthma Daughter    Sickle cell anemia Son    Atrial fibrillation Maternal Uncle    Social History   Socioeconomic History   Marital status: Married    Spouse name: Not on file   Number of children: Not on file   Years of education: Not on file   Highest education level: Not on file  Occupational History    Not on file  Tobacco Use   Smoking status: Never   Smokeless tobacco: Never  Vaping Use   Vaping status: Never Used  Substance and Sexual Activity   Alcohol use: Not Currently   Drug use: Never   Sexual activity: Not on file  Other Topics Concern   Not on file  Social History Narrative   Tobacco use, amount per day now: Never   Past tobacco use, amount per day: Never   How many years did you use tobacco: Never   Alcohol use (drinks per week): Not Currently.   Diet: Eat out a lot.    Do you drink/eat things with caffeine : Sweet Tea   Marital status:   Married                               What year were you married? 2000   Do you live in a house, apartment, assisted living, condo, trailer, etc.? House   Is it one or more stories? 1   How many persons live in your home? 2   Do you have pets in your home?( please list) No   Highest Level of education completed? Bachelors Degree.   Current or past profession: Solicitor, [employer]   Do you exercise?   No  Type and how often?   Do you have a living will? No   Do you have a DNR form?       No                            If not, do you want to discuss one?   Do you have signed POA/HPOA forms?   No                     If so, please bring to you appointment      Do you have any difficulty bathing or dressing yourself? Yes   Do you have any difficulty preparing food or eating? Yes   Do you have any difficulty managing your medications? Yes   Do you have any difficulty managing your finances? No   Do you have any difficulty affording your medications? Yes   Social Drivers of Corporate Investment Banker Strain: Not on file  Food Insecurity: No Food Insecurity (11/18/2023)   Hunger Vital Sign    Worried About Running Out of Food in the Last Year: Never true    Ran Out of Food in the Last Year: Never true  Transportation Needs: No Transportation Needs  (11/18/2023)   PRAPARE - Administrator, Civil Service (Medical): No    Lack of Transportation (Non-Medical): No  Physical Activity: Not on file  Stress: Not on file  Social Connections: Not on file   Past Surgical History:  Procedure Laterality Date   ABDOMINAL HYSTERECTOMY     BIOPSY  02/06/2023   Procedure: BIOPSY;  Surgeon: Leigh Elspeth SQUIBB, MD;  Location: [facility];  Service: Gastroenterology;;   CESAREAN SECTION     3   CORONARY ARTERY BYPASS GRAFT     ESOPHAGOGASTRODUODENOSCOPY (EGD) WITH PROPOFOL  N/A 02/06/2023   Procedure: ESOPHAGOGASTRODUODENOSCOPY (EGD) WITH PROPOFOL ;  Surgeon: Leigh Elspeth SQUIBB, MD;  Location: [facility] ENDOSCOPY;  Service: Gastroenterology;  Laterality: N/A;   Right adrenal gland removal for pheochromocytoma Right    Past Surgical History:  Procedure Laterality Date   ABDOMINAL HYSTERECTOMY     BIOPSY  02/06/2023   Procedure: BIOPSY;  Surgeon: Leigh Elspeth SQUIBB, MD;  Location: [facility] ENDOS[facility];  Service: Gastroenterology;;   CESAREAN SECTION     3   CORONARY ARTERY BYPASS GRAFT     ESOPHAGOGASTRODUODENOSCOPY (EGD) WITH PROPOFOL  N/A 02/06/2023   Procedure: ESOPHAGOGASTRODUODENOSCOPY (EGD) WITH PROPOFOL ;  Surgeon: Leigh Elspeth SQUIBB, MD;  Location: [facility] ENDOSCOPY;  Service: Gastroenterology;  Laterality: N/A;   Right adrenal gland removal for pheochromocytoma Right    Past Medical History:  Diagnosis Date   Acute bronchitis 08/24/2021   Acute cystitis 12/26/2022   Acute metabolic encephalopathy 11/04/2022   Acute renal failure (ARF) 04/27/2023   AKI (acute kidney injury) 04/30/2022   Aspiration pneumonia (HCC) 10/13/2022   CAD (coronary artery disease)    Cerebral infarction, unspecified (HCC) 09/27/2012   Chest pain of uncertain etiology 10/06/2022   Depression    Diabetes mellitus without complication (HCC)    Diabetic hyperosmolar non-ketotic state (HCC) 01/19/2023   DKA (diabetic ketoacidosis) (HCC) 02/19/2021   History of CT scan     History of mammogram    History of MRI    Hypertension    Pheochromocytoma    Reversible cerebrovascular vasoconstriction syndrome    Spell of abnormal behavior    Stroke [facility])    Thyroid  disease    UTI (urinary  tract infection) 11/2023   BP 115/80   Pulse (!) 129   Ht 5' 2 (1.575 m)   Wt 155 lb (70.3 kg)   SpO2 92%   BMI 28.35 kg/m   Opioid Risk Score:   Fall Risk Score:  `1  Depression screen PHQ 2/9     10/28/2023    3:21 PM 10/27/2023    1:21 PM 10/10/2023    9:32 AM 07/29/2023   10:40 AM 06/13/2023   10:07 AM 05/15/2023   10:48 AM 04/03/2023    2:19 PM  Depression screen PHQ 2/9  Decreased Interest 0 0 0 0 0 0 1  Down, Depressed, Hopeless 0 0 0 0 0 0 0  PHQ - 2 Score 0 0 0 0 0 0 1     Review of Systems  Neurological:  Positive for weakness.  All other systems reviewed and are negative.      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 Botox  treatment improved finger flexors but not elbow flexors. Pain persists. - Adjust Botox  injection sites: increase dosage in wrist extensors, decrease in elbow flexors. - Maintain total Botox  dosage. - Encourage stretching exercises for muscle shortening. - Notify primary care provider about elevated heart rate.   Biceps50 2+ Brachialis 50 1+ ECRL 75 2+  FDS50 2+ FDP50 1+ Opponens pollicis 25 2+ Lumbricals 75 0-1+ PT 25 2+

## 2023-12-09 NOTE — Telephone Encounter (Signed)
 The note for 12/09/23 should've been on a new encounter as I ended up call Tanner again with [facility] due to the combined messages.   It appears this is a call about a referral status, forwarding to Carla nurse, adult)

## 2023-12-09 NOTE — Telephone Encounter (Signed)
 [name], advocate with [facility] called on patients behalf stating patient really needs a personal care services,  1.) [name] contacted [facility] they can only provide her with help once a week  2.) Patient does not qualify for the [facility], which is though medicaid  3.) We would need to try again to reach the Provider Line to give information as to why [facility] is medically necessary: [phone], emphasis that provider needs to call. Per [name] information to highlight is patient is bed bound and incontinence, stroke history, anything to prevent infection, rehospitalization and skin breakdown. Patient preference is 7 days a week, 3 hours a day.

## 2023-12-09 NOTE — Telephone Encounter (Signed)
 Copied from CRM [id number]. Topic: Clinical - Home Health Verbal Orders >> Dec 08, 2023  1:03 PM Farrel B wrote: Caller/Agency: [facility] Patrcia Rushing Number: [phone] Service Requested: Physical Therapy Frequency: 2 week @ 2 and 1 week @ 5 Any new concerns about the patient? No >> Dec 09, 2023 11:06 AM Carrielelia G wrote: Attn: Heather Renella Pool pt [place]'s advocate with [facility] calling on the status of her home health care referral.   Please advise

## 2023-12-09 NOTE — Progress Notes (Signed)
 Provider: Roxan Plough FNP-C   [name], [name], NP  Patient Care Team: Koralynn Greenspan C, NP as PCP - General (Family Medicine) Thukkani, Arun K, MD as PCP - Cardiology (Cardiology) [facility] Thapa, Sudan, MD as Consulting Physician (Endocrinology)  Extended Emergency Contact Information Primary Emergency Contact: [name],[name] Mobile Phone: [phone] Relation: Spouse Preferred language: English Interpreter needed? No Secondary Emergency Contact: [name],[name] Home Phone: [phone] Mobile Phone: [phone] Relation: Daughter Mother: [facility] Phone: [phone] Mobile Phone: [phone]  Code Status:  Full Code  Goals of care: Advanced Directive information    11/18/2023    2:00 PM  Advanced Directives  Would patient like information on creating a medical advance directive? No - Patient declined     Chief Complaint  Patient presents with   Transitions Of [facility] follow up.     Discussed the use of AI scribe software for clinical note transcription with the patient, who gave verbal consent to proceed.  History of Present Illness   Lori [name] is a 53 year old female who presents for follow-up after hospitalization for a urinary tract infection with sepsis.  She was hospitalized from October 13 to November 19, 2023, for a urinary tract infection that progressed to sepsis. During her hospital stay, she received IV antibiotics, specifically Rocephin , and was discharged with a prescription for Keflex  500 mg twice daily for three days, which she completed. Her heart rate was elevated at 131 bpm upon admission, and an EKG showed sinus rhythm with left atrial enlargement. Blood cultures were negative, and her white blood cell count was normal at 6.3. Urine culture showed less than 10,000 colonies.  She has essential hypertension and is currently taking metoprolol , olmesartan , and hydrochlorothiazide   for blood pressure management. She continues to take metoprolol .  She has coronary artery disease and is on Plavix , metoprolol , and Zetia  for cholesterol management. No chest pain is reported. She has hyperthyroidism and is taking methimazole  under the care of an endocrinologist.  Her history of stroke is managed with Plavix  and cholesterol medication. She is reluctant to take statins due to previous side effects. She has chronic anemia, which is being monitored. Her hemoglobin was 11.7 during hospitalization. No bleeding symptoms are reported.  For type 2 diabetes, she is on insulin  with a sliding scale and long-acting insulin . She uses a continuous glucose monitor but has not checked her blood sugar since hospital discharge. Her blood sugar was 198 mg/dL today, and she has not taken insulin  yet.  She has a history of deep vein thrombosis and is taking Zoryve as an anticoagulant. Her medication regimen includes Ozempic , Topamax , Tylenol , albuterol , atorvastatin , baclofen , Flexeril , Lexapro , Lunesta, Neurontin , Reglan , nystatin , Zofran , Protonix , Miralax , Senokot, tamsulosin , and Xarelto  She reports not using a CPAP machine for sleep apnea.   Past Medical History:  Diagnosis Date   Acute bronchitis 08/24/2021   Acute cystitis 12/26/2022   Acute metabolic encephalopathy 11/04/2022   Acute renal failure (ARF) 04/27/2023   AKI (acute kidney injury) 04/30/2022   Aspiration pneumonia (HCC) 10/13/2022   CAD (coronary artery disease)    Cerebral infarction, unspecified (HCC) 09/27/2012   Chest pain of uncertain etiology 10/06/2022   Depression    Diabetes mellitus without complication (HCC)    Diabetic hyperosmolar non-ketotic state (HCC) 01/19/2023   DKA (diabetic ketoacidosis) (HCC) 02/19/2021   History of CT scan    History of mammogram    History of MRI    Hypertension  Pheochromocytoma    Reversible cerebrovascular vasoconstriction syndrome    Spell of abnormal behavior    Stroke  [facility])    Thyroid  disease    UTI (urinary tract infection) 11/2023   Past Surgical History:  Procedure Laterality Date   ABDOMINAL HYSTERECTOMY     BIOPSY  02/06/2023   Procedure: BIOPSY;  Surgeon: Leigh Elspeth SQUIBB, MD;  Location: [facility] ENDOSCOPY;  Service: Gastroenterology;;   CESAREAN SECTION     3   CORONARY ARTERY BYPASS GRAFT     ESOPHAGOGASTRODUODENOSCOPY (EGD) WITH PROPOFOL  N/A 02/06/2023   Procedure: ESOPHAGOGASTRODUODENOSCOPY (EGD) WITH PROPOFOL ;  Surgeon: Leigh Elspeth SQUIBB, MD;  Location: [facility] ENDOSCOPY;  Service: Gastroenterology;  Laterality: N/A;   Right adrenal gland removal for pheochromocytoma Right     Allergies  Allergen Reactions   Dorethia Males ] Anaphylaxis and Swelling   Contrast Media [Iodinated Contrast Media] Anaphylaxis and Swelling   Imitrex [Sumatriptan] Anaphylaxis   Ms Contin  [Morphine ] Anaphylaxis and Swelling   Penicillins Swelling and Other (See Comments)    Mouth and eyes swell   Chlorhexidine  Gluconate [Chlorhexidine ] Itching and Rash   Firvanq  [Vancomycin ] Other (See Comments)    Red Man's Syndrome   Cipro  [Ciprofloxacin  Hcl] Nausea And Vomiting   Nitrofuran Derivatives Anxiety   Wound Dressing Adhesive Itching and Rash    Allergies as of 11/26/2023       Reactions   Asa [aspirin ] Anaphylaxis, Swelling   Contrast Media [iodinated Contrast Media] Anaphylaxis, Swelling   Imitrex [sumatriptan] Anaphylaxis   Ms Contin  [morphine ] Anaphylaxis, Swelling   Penicillins Swelling, Other (See Comments)   Mouth and eyes swell   Chlorhexidine  Gluconate [chlorhexidine ] Itching, Rash   Firvanq  [vancomycin ] Other (See Comments)   Red Man's Syndrome   Cipro  [ciprofloxacin  Hcl] Nausea And Vomiting   Nitrofuran Derivatives Anxiety   Wound Dressing Adhesive Itching, Rash        Medication List        Accurate as of November 26, 2023 11:59 PM. If you have any questions, ask your nurse or doctor.          STOP taking these medications    Ozempic   (0.25 or 0.5 MG/DOSE) 2 MG/1.5ML Sopn Generic drug: Semaglutide (0.25 or 0.5MG /DOS) Replaced by: Semaglutide  (1 MG/DOSE) 4 MG/3ML Sopn Stopped by: Avory Mimbs C Kayslee Furey   sucralfate  1 GM/10ML suspension Commonly known as: CARAFATE  Stopped by: Arik Husmann C Sydny Schnitzler       TAKE these medications    acetaminophen  325 MG tablet Commonly known as: TYLENOL  Take 2 tablets (650 mg total) by mouth every 6 (six) hours as needed for mild pain (pain score 1-3) (or Fever >/= 101).   albuterol  108 (90 Base) MCG/ACT inhaler Commonly known as: VENTOLIN  HFA INHALE 2 PUFFS BY MOUTH EVERY 6 HOURS AS NEEDED FOR WHEEZING AND SHORTNESS OF BREATH   atorvastatin  80 MG tablet Commonly known as: LIPITOR  Take 1 tablet (80 mg total) by mouth every evening.   baclofen  10 MG tablet Commonly known as: LIORESAL  Take 1 tablet (10 mg total) by mouth 3 (three) times daily.   clopidogrel  75 MG tablet Commonly known as: PLAVIX  TAKE 1 TABLET BY MOUTH DAILY What changed: when to take this   cyclobenzaprine  5 MG tablet Commonly known as: FLEXERIL  Take 1 tablet (5 mg total) by mouth 3 (three) times daily as needed for muscle spasms.   escitalopram  20 MG tablet Commonly known as: LEXAPRO  TAKE 1 TABLET BY MOUTH EVERY  MORNING   eszopiclone 2 MG Tabs tablet Commonly known as:  LUNESTA Take 1 tablet (2 mg total) by mouth at bedtime as needed for sleep. Take immediately before bedtime What changed: when to take this   ezetimibe  10 MG tablet Commonly known as: ZETIA  TAKE 1 TABLET BY MOUTH DAILY What changed: when to take this   FreeStyle Libre 3 Plus Sensor Misc 1 each by Does not apply route continuous. Change every 15 days. What changed:  how much to take how to take this when to take this additional instructions   FreeStyle Libre 3 Plus Sensor Misc Change every 15 days What changed: Another medication with the same name was changed. Make sure you understand how and when to take each.   gabapentin  100 MG  capsule Commonly known as: NEURONTIN  Take 1 capsule (100 mg total) by mouth 3 (three) times daily.   HumaLOG  KwikPen 100 UNIT/ML KwikPen Generic drug: insulin  lispro 10-15 units with meals 3 times a day, maximum 45 units/day. What changed:  how much to take how to take this when to take this additional instructions   Insulin  Pen Needle 32G X 4 MM Misc Use 4x a day   Lantus  SoloStar 100 UNIT/ML Solostar Pen Generic drug: insulin  glargine Inject 18 Units into the skin at bedtime.   methimazole  5 MG tablet Commonly known as: TAPAZOLE  Take 1 tablet (5 mg total) by mouth every morning.   metoCLOPramide  10 MG tablet Commonly known as: REGLAN  Take 1 tablet (10 mg total) by mouth every 6 (six) hours.   metoprolol  tartrate 50 MG tablet Commonly known as: LOPRESSOR  Take 1 tablet (50 mg total) by mouth 2 (two) times daily.   nystatin  100000 UNIT/ML suspension Commonly known as: MYCOSTATIN  Take 5 mLs (500,000 Units total) by mouth 4 (four) times daily.   nystatin  cream Commonly known as: MYCOSTATIN  Apply 1 Application topically 2 (two) times daily. What changed:  when to take this additional instructions   olmesartan -hydrochlorothiazide  40-25 MG tablet Commonly known as: BENICAR  HCT Take 1 tablet by mouth at bedtime.   ondansetron  4 MG disintegrating tablet Commonly known as: ZOFRAN -ODT Take 1 tablet (4 mg total) by mouth every 8 (eight) hours as needed for nausea or vomiting (dissolve orally).   pantoprazole  40 MG tablet Commonly known as: PROTONIX  TAKE 1 TABLET BY MOUTH TWICE  DAILY What changed: when to take this   polyethylene glycol powder 17 GM/SCOOP powder Commonly known as: GLYCOLAX /MIRALAX  Take 17 g by mouth daily. What changed: when to take this   Semaglutide  (1 MG/DOSE) 4 MG/3ML Sopn Inject 1 mg as directed once a week. Replaces: Ozempic  (0.25 or 0.5 MG/DOSE) 2 MG/1.5ML Sopn Started by: Saje Gallop C Ilianna Bown   senna-docusate 8.6-50 MG tablet Commonly known  as: Senokot-S Take 2 tablets by mouth 2 (two) times daily.   tamsulosin  0.4 MG Caps capsule Commonly known as: FLOMAX  TAKE 1 CAPSULE BY MOUTH AT BEDTIME   topiramate  25 MG tablet Commonly known as: TOPAMAX  TAKE 1 TABLET BY MOUTH TWICE DAILY   Xarelto  10 MG Tabs tablet Generic drug: rivaroxaban  TAKE 1 TABLET BY MOUTH DAILY What changed: when to take this        Review of Systems  Constitutional:  Negative for appetite change, chills, fatigue, fever and unexpected weight change.  HENT:  Negative for congestion, dental problem, ear discharge, ear pain, facial swelling, hearing loss, nosebleeds, postnasal drip, rhinorrhea, sinus pressure, sinus pain, sneezing, sore throat, tinnitus and trouble swallowing.   Eyes:  Negative for pain, discharge, redness, itching and visual disturbance.  Respiratory:  Negative  for cough, chest tightness, shortness of breath and wheezing.   Cardiovascular:  Negative for chest pain, palpitations and leg swelling.  Gastrointestinal:  Negative for abdominal distention, abdominal pain, blood in stool, constipation, diarrhea, nausea and vomiting.  Endocrine: Negative for cold intolerance, heat intolerance, polydipsia, polyphagia and polyuria.  Genitourinary:  Negative for difficulty urinating, dysuria, flank pain, frequency and urgency.  Musculoskeletal:  Positive for gait problem. Negative for arthralgias, back pain, joint swelling, myalgias, neck pain and neck stiffness.  Skin:  Negative for color change, pallor, rash and wound.  Neurological:  Negative for dizziness, syncope, light-headedness, numbness and headaches.       Hx of stroke with left sided weakness   Hematological:  Does not bruise/bleed easily.  Psychiatric/Behavioral:  Negative for agitation, behavioral problems, confusion, hallucinations, self-injury, sleep disturbance and suicidal ideas. The patient is not nervous/anxious.     Immunization History  Administered Date(s) Administered    Influenza-Unspecified 11/04/2016   PPD Test 03/19/2016   Pfizer(Comirnaty)Fall Seasonal Vaccine 12 years and older 09/29/2019, 03/31/2020   Tdap 02/07/2014   Pertinent  Health Maintenance Due  Topic Date Due   FOOT EXAM  Never done   Mammogram  Never done   Colonoscopy  Never done   Influenza Vaccine  06/15/2024 (Originally 09/05/2023)   OPHTHALMOLOGY EXAM  02/19/2024   HEMOGLOBIN A1C  05/17/2024      07/29/2023   10:40 AM 10/10/2023    9:31 AM 10/27/2023    1:21 PM 10/28/2023    3:21 PM 12/09/2023   11:35 AM  Fall Risk  Falls in the past year? Exclusion - non ambulatory 0 0 0 0  Was there an injury with Fall?   0 0   Fall Risk Category Calculator   0 0   Patient at Risk for Falls Due to  Impaired balance/gait;Impaired mobility No Fall Risks    Fall risk Follow up   Falls evaluation completed     Functional Status Survey:    Vitals:   11/26/23 1323  BP: 138/88  Pulse: (!) 112  Resp: 20  Temp: 97.9 F (36.6 C)  SpO2: 95%  Weight: 174 lb (78.9 kg)  Height: 5' 2 (1.575 m)   Body mass index is 31.83 kg/m. Physical Exam  VITALS: P- 112, BP- 112/ GENERAL: Alert, cooperative, well developed, no acute distress. HEENT: Normocephalic, normal oropharynx, moist mucous membranes. CHEST: Clear to auscultation bilaterally, no wheezes, rhonchi, or crackles. CARDIOVASCULAR: Normal heart rate and rhythm, S1 and S2 normal without murmurs. ABDOMEN: Soft, non-tender, non-distended, without organomegaly, normal bowel sounds. EXTREMITIES: No cyanosis or edema.Left sided weakness  NEUROLOGICAL: Cranial nerves grossly intact, moves all extremities without gross motor or sensory deficit. SKIN: No wounds or rashes.no lesion or erythema   PSYCHIATRY/BEHAVIORAL: Mood stable    Labs reviewed: Recent Labs    01/02/23 0440 01/03/23 1104 04/29/23 0519 04/30/23 0520 05/13/23 0453 05/15/23 1126 11/17/23 1800 11/18/23 1952 11/26/23 1401  NA 130*   < > 144   < > 131*   < > 135 135 137  K  3.3*   < > 3.8   < > 3.2*   < > 4.1 4.8 4.5  CL 97*   < > 110   < > 102   < > 106 106 102  CO2 20*   < > 25   < > 23   < > 19* 20* 25  GLUCOSE 235*   < > 103*   < > 180*   < >  94 260* 213*  BUN 8   < > 25*   < > 10   < > 19 18 19   CREATININE 0.77   < > 1.46*   < > 0.80   < > 0.87 0.95 0.91  CALCIUM  8.5*   < > 9.1   < > 7.9*   < > 9.3 8.7* 9.8  MG 1.7   < > 1.7   < > 1.7  --   --  1.6* 1.8  PHOS 3.3  --  3.5  --   --   --   --  3.2  --    < > = values in this interval not displayed.   Recent Labs    06/15/23 0744 10/27/23 1407 11/17/23 1800 11/18/23 1952 11/26/23 1401  AST 14*   < > 17 17 12   ALT 11   < > 18 18 16   ALKPHOS 60  --  85 85  --   BILITOT 0.6   < > 0.3 0.4 0.3  PROT 6.9   < > 6.6 6.7 6.9  ALBUMIN  3.3*  --  3.1* 2.9*  --    < > = values in this interval not displayed.   Recent Labs    10/27/23 1407 11/17/23 1800 11/18/23 1952 11/26/23 1401  WBC 8.8 8.0 6.3 7.1  NEUTROABS 5,166 3.4  --  3,962  HGB 13.5 11.5* 11.7* 11.9  HCT 43.6 35.8* 36.6 38.5  MCV 80.9 78.0* 78.9* 81.2  PLT 429* 351 313 373   Lab Results  Component Value Date   TSH 0.26 (L) 11/12/2023   Lab Results  Component Value Date   HGBA1C 9.1 (H) 11/17/2023   Lab Results  Component Value Date   CHOL 158 10/27/2023   HDL 57 10/27/2023   LDLCALC 81 10/27/2023   TRIG 105 10/27/2023   CHOLHDL 2.8 10/27/2023    Significant Diagnostic Results in last 30 days:  ECHOCARDIOGRAM COMPLETE Result Date: 11/18/2023    ECHOCARDIOGRAM REPORT   Patient Name:   Lori [name] Date of Exam: 11/18/2023 Medical Rec #:  [id number]                           Height:       62.0 in Accession #:    [id number]                          Weight:       172.0 lb Date of Birth:  1970-03-08                            BSA:          1.793 m Patient Age:    53 years                            BP:           142/92 mmHg Patient Gender: F                                   HR:           80 bpm. Exam Location:   Inpatient Procedure: 2D Echo, Cardiac Doppler and Color Doppler (Both Spectral and Color  Flow Doppler were utilized during procedure). Indications:    Chest pain  History:        Patient has prior history of Echocardiogram examinations, most                 recent 10/07/2022. CAD, Stroke, Arrythmias:Tachycardia; Risk                 Factors:Dyslipidemia, Hypertension and Diabetes.  Sonographer:    Juliene Rucks Referring Phys: BLEASE QUIVER  Sonographer Comments: Technically challenging study due to limited acoustic windows, Technically difficult study due to poor echo windows, suboptimal apical window, no subcostal window and patient is obese. IMPRESSIONS  1. Left ventricular ejection fraction, by estimation, is 55 to 60%. The left ventricle has normal function. Left ventricular endocardial border not optimally defined to evaluate regional wall motion. Left ventricular diastolic function could not be evaluated.  2. Right ventricular systolic function was not well visualized. The right ventricular size is not well visualized.  3. The mitral valve is grossly normal. Trivial mitral valve regurgitation. No evidence of mitral stenosis.  4. The aortic valve is tricuspid. Aortic valve regurgitation is not visualized. Aortic valve sclerosis/calcification is present, without any evidence of aortic stenosis. Comparison(s): No significant change from prior study. FINDINGS  Left Ventricle: Left ventricular ejection fraction, by estimation, is 55 to 60%. The left ventricle has normal function. Left ventricular endocardial border not optimally defined to evaluate regional wall motion. The left ventricular internal cavity size was normal in size. There is no left ventricular hypertrophy. Left ventricular diastolic function could not be evaluated due to nondiagnostic images. Left ventricular diastolic function could not be evaluated. Right Ventricle: The right ventricular size is not well visualized. Right vetricular  wall thickness was not well visualized. Right ventricular systolic function was not well visualized. Left Atrium: Left atrial size was normal in size. Right Atrium: Right atrial size was not well visualized. Pericardium: Trivial pericardial effusion is present. Mitral Valve: The mitral valve is grossly normal. Trivial mitral valve regurgitation. No evidence of mitral valve stenosis. Tricuspid Valve: The tricuspid valve is grossly normal. Tricuspid valve regurgitation is mild . No evidence of tricuspid stenosis. Aortic Valve: The aortic valve is tricuspid. Aortic valve regurgitation is not visualized. Aortic valve sclerosis/calcification is present, without any evidence of aortic stenosis. Pulmonic Valve: The pulmonic valve was grossly normal. Pulmonic valve regurgitation is not visualized. No evidence of pulmonic stenosis. Aorta: The aortic root and ascending aorta are structurally normal, with no evidence of dilitation. Venous: The inferior vena cava was not well visualized. IAS/Shunts: The interatrial septum was not well visualized.  LEFT VENTRICLE PLAX 2D LVIDd:         4.40 cm LVIDs:         2.80 cm LV PW:         0.80 cm LV IVS:        0.90 cm LVOT diam:     1.90 cm LV SV:         32 LV SV Index:   18 LVOT Area:     2.84 cm  LEFT ATRIUM         Index LA diam:    3.20 cm 1.78 cm/m  AORTIC VALVE LVOT Vmax:   64.50 cm/s LVOT Vmean:  43.200 cm/s LVOT VTI:    0.114 m  AORTA Ao Root diam: 2.90 cm Ao Asc diam:  2.20 cm MITRAL VALVE  TRICUSPID VALVE MV Area (PHT): 4.29 cm    TR Peak grad:   17.0 mmHg MV Decel Time: 177 msec    TR Vmax:        206.00 cm/s MV E velocity: 60.60 cm/s MV A velocity: 50.00 cm/s  SHUNTS MV E/A ratio:  1.21        Systemic VTI:  0.11 m                            Systemic Diam: 1.90 cm Darryle Decent MD Electronically signed by Darryle Decent MD Signature Date/Time: 11/18/2023/4:49:59 PM    Final    DG Chest Port 1 View Result Date: 11/17/2023 EXAM: 1 VIEW(S) XRAY OF THE CHEST  11/17/2023 05:03:00 PM COMPARISON: 06/07/2023 CLINICAL HISTORY: Questionable sepsis - evaluate for abnormality. Triage notes: Pt [facility] from PCP for possible UTI. Urinary frequency, burning, weakness x1 week, palpitations, tachy. Hx of stroke, Gen weakness BL since. 110/75 97% RA, RR 24, CBG 1130 ETC02 33, 97.8 FINDINGS: LUNGS AND PLEURA: Elevated right hemidiaphragm. No focal pulmonary opacity. No pulmonary edema. No pleural effusion. No pneumothorax. HEART AND MEDIASTINUM: Surgical changes in mediastinum. BONES AND SOFT TISSUES: Sternotomy wires in place. No acute osseous abnormality. IMPRESSION: 1. No acute cardiopulmonary findings. Electronically signed by: Norman Gatlin MD 11/17/2023 05:28 PM EDT RP Workstation: [id number]    Assessment/Plan  Urinary tract infection, recently treated and resolved sepsis Recent urinary tract infection with signs of sepsis due to tachycardia and tachypnea. Treated with IV antibiotics (Rocephin ) in the hospital. White blood cell count was normal, and blood cultures were negative. Urine culture showed less than 10,000 colonies. Completed course of Keflex  post-discharge.  Type 2 diabetes mellitus Blood sugars have not been checked regularly since hospital discharge. Current blood sugar is 198 mg/dL. Using [facility] for continuous monitoring. On insulin  regimen with sliding scale and long-acting insulin . Recently started on Ozempic  0.5 mg weekly, considering increasing dose to 1 mg weekly. - Increase Ozempic  to 1 mg weekly - Ensure regular use of Ozempic  every Sunday - Monitor blood sugars regularly using Freestyle Libre - Encourage adherence to insulin  regimen  Constipation, medication-related Constipation possibly related to Ozempic  use. - Use Miralax  or Senokot if needed to maintain regular bowel movements  Essential hypertension Blood pressure management with metoprolol , olmesartan , and hydrochlorothiazide .  Chronic congestive heart  failure Echocardiogram shows left systolic function with ejection fraction of 55-60%. No leg swelling or exacerbation noted. - Continue current management with metoprolol   Coronary artery disease No recent chest pain reported. - Continue management with Plavix , metoprolol , and Zetia  for cholesterol control  Hyperthyroidism Management with methimazole . Follow-up with endocrinologist is pending.  Chronic anemia Anemia is well-managed but hemoglobin dropped to 11.7 g/dL during hospitalization. No signs of bleeding noted. - Order CBC with differential to monitor anemia  Stroke with left-sided weakness Continue management with Plavix  and statin for secondary prevention. She is hesitant about statin use but advised on its importance for cholesterol management. - Encourage adherence to Plavix  and statin therapy  History of Deep vein thrombosis, on anticoagulation On Xarelto  for anticoagulation.  Insomnia Currently managed with Lunesta at bedtime.  Depression Managed with Lexapro  20 mg daily.  - Monitor mood  Family/ staff Communication: Reviewed plan of care with patient spouse verbalized understanding  Labs/tests ordered:  - CBC with Differential/Platelet - CMP with eGFR([facility]) - Magnesium   Next Appointment : Return if symptoms worsen or fail to improve.  Spent 30  minutes of Face to face and non-face to face with patient  >50% time spent counseling; reviewing medical record; tests; labs; documentation and developing future plan of care.   Roxan JAYSON Plough, NP

## 2023-12-10 ENCOUNTER — Other Ambulatory Visit: Payer: Self-pay | Admitting: Family

## 2023-12-10 NOTE — Telephone Encounter (Signed)
 Outgoing call placed to [facility], after several automated prompts, and options that did not appear suitable for reason for call, and repeating my request to speak with a representative the call ended (total time spent 9 minutes)  I called back, was able to speak with a representative, [place] (female). [place] states personal care services is not a covered benefit (Call ref number [id number]).  Call returned to [place] and above information relayed. [name] called patient on 3-way to see if she would be interested in us  placing a referral to value based care for a social worker to help with resources in the community, as related to affordable personal care services. Patient did not answer after 2 attempts. [name] states she will continue to try to connect with patient and if she agrees to referral she will let us  know.

## 2023-12-10 NOTE — Telephone Encounter (Signed)
 Routing to patients provider who prescribed patients requested medication.

## 2023-12-10 NOTE — Telephone Encounter (Signed)
 Patient has requested baclofen  10mg  , medication was ordered with 90 day supply on 11/12/2023 with [facility] Delivery. New rx is requesting to go through [employer]. Would you like to approve medication ?

## 2023-12-22 DIAGNOSIS — I2510 Atherosclerotic heart disease of native coronary artery without angina pectoris: Secondary | ICD-10-CM

## 2023-12-22 DIAGNOSIS — N3000 Acute cystitis without hematuria: Secondary | ICD-10-CM | POA: Diagnosis not present

## 2023-12-22 DIAGNOSIS — G4733 Obstructive sleep apnea (adult) (pediatric): Secondary | ICD-10-CM

## 2023-12-22 DIAGNOSIS — I69354 Hemiplegia and hemiparesis following cerebral infarction affecting left non-dominant side: Secondary | ICD-10-CM

## 2023-12-22 DIAGNOSIS — I5032 Chronic diastolic (congestive) heart failure: Secondary | ICD-10-CM | POA: Diagnosis not present

## 2023-12-22 DIAGNOSIS — A419 Sepsis, unspecified organism: Secondary | ICD-10-CM | POA: Diagnosis not present

## 2023-12-22 DIAGNOSIS — E66811 Obesity, class 1: Secondary | ICD-10-CM

## 2023-12-22 DIAGNOSIS — F32A Depression, unspecified: Secondary | ICD-10-CM

## 2023-12-22 DIAGNOSIS — D649 Anemia, unspecified: Secondary | ICD-10-CM

## 2023-12-22 DIAGNOSIS — J4520 Mild intermittent asthma, uncomplicated: Secondary | ICD-10-CM

## 2023-12-22 DIAGNOSIS — I110 Hypertensive heart disease with heart failure: Secondary | ICD-10-CM | POA: Diagnosis not present

## 2023-12-22 DIAGNOSIS — Z794 Long term (current) use of insulin: Secondary | ICD-10-CM

## 2023-12-23 ENCOUNTER — Telehealth: Payer: Self-pay | Admitting: *Deleted

## 2023-12-23 DIAGNOSIS — Z139 Encounter for screening, unspecified: Secondary | ICD-10-CM

## 2023-12-23 NOTE — Telephone Encounter (Unsigned)
 Copied from CRM [id number]. Topic: General - Other >> Dec 23, 2023  1:09 PM [name] wrote: Reason for CRM: [name] the patients advocate is calling in that the patients would like to go forward to have someone come out to the patients house to address her needs and see if any community resources would be able for the patient. [name] stated her and Heather have spoke about this before. Please advise [name] at [phone].

## 2023-12-23 NOTE — Telephone Encounter (Signed)
 Referral placed to social work to address request.

## 2023-12-24 NOTE — Telephone Encounter (Signed)
 Spoke with Sandra as she return my call and that she has no further questions at this time.

## 2023-12-24 NOTE — Telephone Encounter (Signed)
 Left a detail voicemail for [name] the patients advocate to let her know that a referral was placed to a social work to address for the patient needs and for community resources would be able for the patient.

## 2023-12-25 ENCOUNTER — Telehealth: Payer: Self-pay

## 2023-12-25 NOTE — Progress Notes (Signed)
 Complex Care Management Note Care Guide Note  12/25/2023 Name: Lori [name] [facility] MRN: [id number] DOB: 1970-11-05   Complex Care Management Outreach Attempts: An unsuccessful telephone outreach was attempted today to offer the patient information about available complex care management services.  Follow Up Plan:  Additional outreach attempts will be made to offer the patient complex care management information and services.   Encounter Outcome:  No Answer  Jeoffrey Buffalo , RMA     [place]  [facility], [facility] Guide  Direct Dial : [phone]  Website: [email or web address]

## 2023-12-25 NOTE — Telephone Encounter (Signed)
 Copied from CRM #[id number]. Topic: Clinical - Home Health Verbal Orders >> Dec 25, 2023  9:51 AM [name] wrote: Caller/Agency: Signe from [facility] Callback Number: [phone] Service Requested: Occupational Therapy Frequency: 1 time a week for 4 weeks beginning next week Any new concerns about the patient? No

## 2023-12-25 NOTE — Telephone Encounter (Signed)
 Call returned to [name] and verbal orders authorized per [employer] standing order

## 2023-12-28 ENCOUNTER — Other Ambulatory Visit: Payer: Self-pay | Admitting: Family

## 2023-12-28 DIAGNOSIS — I10 Essential (primary) hypertension: Secondary | ICD-10-CM

## 2023-12-29 NOTE — Telephone Encounter (Signed)
Pharmacy requested refills.  ?Pended Rx's and sent to [facility] for approval.  ?

## 2023-12-31 ENCOUNTER — Telehealth: Payer: Self-pay | Admitting: Physical Medicine & Rehabilitation

## 2024-01-06 ENCOUNTER — Telehealth: Payer: Self-pay

## 2024-01-06 NOTE — Telephone Encounter (Signed)
 Received fax from [facility] that patient has switched pharmacies and were requesting refill for sucralfate .  Called patient but unable to leave a message as it rings and no VM. Patient last seen in 04-2023 and was given sucralfate  at bedtime. Will continue to try to reach patient to see where she wants her meds sent but phone just rings and rings.

## 2024-01-06 NOTE — Progress Notes (Unsigned)
 Complex Care Management Note Care Guide Note  01/06/2024 Name: Lori [name] MRN: [id number] DOB: [birth year]   Complex Care Management Outreach Attempts: A second unsuccessful outreach was attempted today to offer the patient with information about available complex care management services.  Follow Up Plan:  Additional outreach attempts will be made to offer the patient complex care management information and services.   Encounter Outcome:  No Answer  Jeoffrey Buffalo , RMA     [place]  [facility], [facility] Guide  Direct Dial : [phone]  Website: [email or web address]

## 2024-01-07 NOTE — Telephone Encounter (Signed)
 Called patient but the number just rings and rings and then goes Busy.  Sending letter to patient asking that she contact us  to confirm if she would like to change her pharmacy to [facility] in [place].

## 2024-01-08 ENCOUNTER — Telehealth: Payer: Self-pay

## 2024-01-08 NOTE — Telephone Encounter (Signed)
 Copied from CRM #[id number]. Topic: Clinical - Prescription Issue >> Jan 08, 2024  9:04 AM Graeme ORN wrote: Reason for CRM: [facility] called to check status of faxed request. Patient recently enrolled and they are request all active medications be sent to their pharmacy. Phone: [phone] Pharmacy: [phone] Fax: [phone]

## 2024-01-08 NOTE — Telephone Encounter (Signed)
 Spoke with Rx pharmacy in regards to fax status they are re-faxing the medication refills

## 2024-01-09 ENCOUNTER — Encounter: Payer: Self-pay | Attending: Physical Medicine & Rehabilitation | Admitting: Physical Medicine and Rehabilitation

## 2024-01-09 ENCOUNTER — Encounter: Payer: Self-pay | Admitting: Physical Medicine and Rehabilitation

## 2024-01-09 VITALS — BP 157/92 | HR 107 | Ht 62.0 in | Wt 155.0 lb

## 2024-01-09 DIAGNOSIS — Z993 Dependence on wheelchair: Secondary | ICD-10-CM | POA: Diagnosis present

## 2024-01-09 DIAGNOSIS — I6930 Unspecified sequelae of cerebral infarction: Secondary | ICD-10-CM | POA: Diagnosis present

## 2024-01-09 DIAGNOSIS — I639 Cerebral infarction, unspecified: Secondary | ICD-10-CM | POA: Insufficient documentation

## 2024-01-09 DIAGNOSIS — G8114 Spastic hemiplegia affecting left nondominant side: Secondary | ICD-10-CM | POA: Insufficient documentation

## 2024-01-09 NOTE — Progress Notes (Signed)
 Subjective:    Patient ID: Lori [name], female    DOB: 03-Jul-1970, 53 y.o.   MRN: [id number]  HPI  Pt is a 53 yr old female with chronic spasticity and L hemiplegia from prior stroke- 10/22- latest one; but said she's has 22 strokes in past.  and Debility related to DKA and recent rehab admission- also has aphasia and poorly controlled HTN   Here for f/u on strokes    Things great  Wants more H/H- was working with PT and OT- but last week, PT didn't come.  Can walk from Bedroom to front door.  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   Spasticity is OK.  Due for Botox  in 2 weeks 12/18   Bowels- only goes 1x/week.      Pain Inventory Average Pain 0 Pain Right Now 0 My pain is No answer  In the last 24 hours, has pain interfered with the following? General activity 0 Relation with others 0 Enjoyment of life 0 What TIME of day is your pain at its worst? varies Sleep (in general) Poor  Pain is worse with: No answer Pain improves with: No answer Relief from Meds: 0  Family History  Problem Relation Age of Onset   Hypertension Mother    Diabetes Mother    Atrial fibrillation Mother    Hypertension Father    Heart attack Father    Dementia Father    Diabetes Brother    Brain cancer Brother    Asthma Daughter    Sickle cell anemia Son    Atrial fibrillation Maternal Uncle    Social History   Socioeconomic History   Marital status: Married    Spouse name: Not on file   Number of children: Not on file   Years of education: Not on file   Highest education level: Not on file  Occupational History   Not on file  Tobacco Use   Smoking status: Never   Smokeless tobacco: Never  Vaping Use   Vaping status: Never Used  Substance and Sexual Activity   Alcohol use: Not Currently   Drug use: Never   Sexual activity: Not on file  Other Topics Concern   Not on file  Social History Narrative   Tobacco use, amount per day  now: Never   Past tobacco use, amount per day: Never   How many years did you use tobacco: Never   Alcohol use (drinks per week): Not Currently.   Diet: Eat out a lot.    Do you drink/eat things with caffeine : Sweet Tea   Marital status:   Married                               What year were you married? 2000   Do you live in a house, apartment, assisted living, condo, trailer, etc.? House   Is it one or more stories? 1   How many persons live in your home? 2   Do you have pets in your home?( please list) No   Highest Level of education completed? Bachelors Degree.   Current or past profession: Solicitor, [employer]   Do you exercise?   No                               Type and how  often?   Do you have a living will? No   Do you have a DNR form?       No                            If not, do you want to discuss one?   Do you have signed POA/HPOA forms?   No                     If so, please bring to you appointment      Do you have any difficulty bathing or dressing yourself? Yes   Do you have any difficulty preparing food or eating? Yes   Do you have any difficulty managing your medications? Yes   Do you have any difficulty managing your finances? No   Do you have any difficulty affording your medications? Yes   Social Drivers of Corporate Investment Banker Strain: Not on file  Food Insecurity: No Food Insecurity (11/18/2023)   Hunger Vital Sign    Worried About Running Out of Food in the Last Year: Never true    Ran Out of Food in the Last Year: Never true  Transportation Needs: No Transportation Needs (11/18/2023)   PRAPARE - Administrator, Civil Service (Medical): No    Lack of Transportation (Non-Medical): No  Physical Activity: Not on file  Stress: Not on file  Social Connections: Not on file   Past Surgical History:  Procedure Laterality Date   ABDOMINAL HYSTERECTOMY     BIOPSY  02/06/2023   Procedure: BIOPSY;   Surgeon: Leigh Elspeth SQUIBB, MD;  Location: [facility];  Service: Gastroenterology;;   CESAREAN SECTION     3   CORONARY ARTERY BYPASS GRAFT     ESOPHAGOGASTRODUODENOSCOPY (EGD) WITH PROPOFOL  N/A 02/06/2023   Procedure: ESOPHAGOGASTRODUODENOSCOPY (EGD) WITH PROPOFOL ;  Surgeon: Leigh Elspeth SQUIBB, MD;  Location: [facility];  Service: Gastroenterology;  Laterality: N/A;   Right adrenal gland removal for pheochromocytoma Right    Past Surgical History:  Procedure Laterality Date   ABDOMINAL HYSTERECTOMY     BIOPSY  02/06/2023   Procedure: BIOPSY;  Surgeon: Leigh Elspeth SQUIBB, MD;  Location: [facility];  Service: Gastroenterology;;   CESAREAN SECTION     3   CORONARY ARTERY BYPASS GRAFT     ESOPHAGOGASTRODUODENOSCOPY (EGD) WITH PROPOFOL  N/A 02/06/2023   Procedure: ESOPHAGOGASTRODUODENOSCOPY (EGD) WITH PROPOFOL ;  Surgeon: Leigh Elspeth SQUIBB, MD;  Location: [facility];  Service: Gastroenterology;  Laterality: N/A;   Right adrenal gland removal for pheochromocytoma Right    Past Medical History:  Diagnosis Date   Acute bronchitis 08/24/2021   Acute cystitis 12/26/2022   Acute metabolic encephalopathy 11/04/2022   Acute renal failure (ARF) 04/27/2023   AKI (acute kidney injury) 04/30/2022   Aspiration pneumonia (HCC) 10/13/2022   CAD (coronary artery disease)    Cerebral infarction, unspecified (HCC) 09/27/2012   Chest pain of uncertain etiology 10/06/2022   Depression    Diabetes mellitus without complication (HCC)    Diabetic hyperosmolar non-ketotic state (HCC) 01/19/2023   DKA (diabetic ketoacidosis) (HCC) 02/19/2021   History of CT scan    History of mammogram    History of MRI    Hypertension    Pheochromocytoma    Reversible cerebrovascular vasoconstriction syndrome    Spell of abnormal behavior    Stroke (HCC)    Thyroid  disease    UTI (urinary tract infection) 11/2023  BP (!) 157/92 (Patient Position: Sitting, Cuff Size: Large) Comment: patient did not take  BP meds this am  Pulse (!) 107   Ht 5' 2 (1.575 m)   Wt 155 lb (70.3 kg) Comment: last documented weight  SpO2 97%   BMI 28.35 kg/m   Opioid Risk Score:   Fall Risk Score:  `1  Depression screen PHQ 2/9     10/28/2023    3:21 PM 10/27/2023    1:21 PM 10/10/2023    9:32 AM 07/29/2023   10:40 AM 06/13/2023   10:07 AM 05/15/2023   10:48 AM 04/03/2023    2:19 PM  Depression screen PHQ 2/9  Decreased Interest 0 0 0 0 0 0 1  Down, Depressed, Hopeless 0 0 0 0 0 0 0  PHQ - 2 Score 0 0 0 0 0 0 1      Review of Systems  All other systems reviewed and are negative.      Objective:   Physical Exam  Awake, alert, appropriate, L hand is balled up;  accompanied by partner, NAD  Neuro: MAS 3 in L shoulder, elbow mainly with extension- and wrist- MAS of 4 in thumb and fingers- balled up at rest- mainly flexed at MCP's-  no skin hygiene issues  LLE-  MAS of 1+ in L hip, knee and ankle     Assessment & Plan:   Pt is a 53 yr old female with chronic spasticity and L hemiplegia from prior stroke- 10/22- latest one; but said she's has 22 strokes in past.  and Debility related to DKA and recent rehab admission- also has aphasia and poorly controlled HTN   Here for f/u on strokes     Home health needs to send me a recertification if needs to recert.    2. Needs to get Senna -start with 1 tab/day and can increase to 2 tabs day if needed- if stool is hard, then use  Colace for that.    3.  Main spasticity is in her LUE not very much in LLE-  Con't baclofen  10 mg 3x/day- gets from PCP.  Needs to get refills filled- has 1 year refill as of 12/11/23.    4. Botulinum toxin is due 12/18- so at the end of her Botox  results. - so hard to tell how good it is overall.   5.  Surgery for L hand- not sure if appropriate- since hand nonfunctional-  6. I do suggest using washcloth to keep hand open more than right now. Could also use kitchen sponge cut in half so folds in half- so that would be  great for L hand.   7. F/U in 3 months- has appt with Dr Gunnar 01/22/24 for Botulinum toxin.    I spent a total of 20   minutes on total care today- >50% coordination of care- due to d/w pt about surgery- which I don't think is appropriate for L hand, due ot lack of strength- and how to arrange L hand- also about Botox  and that she doesn't meet criteria for inpt rehab

## 2024-01-09 NOTE — Patient Instructions (Signed)
 Pt is a 53 yr old female with chronic spasticity and L hemiplegia from prior stroke- 10/22- latest one; but said she's has 22 strokes in past.  and Debility related to DKA and recent rehab admission- also has aphasia and poorly controlled HTN   Here for f/u on strokes     Home health needs to send me a recertification if needs to recert.    2. Needs to get Senna -start with 1 tab/day and can increase to 2 tabs day if needed- if stool is hard, then use  Colace for that.    3.  Main spasticity is in her LUE not very much in LLE-  Con't baclofen  10 mg 3x/day- gets from PCP.  Needs to get refills filled- has 1 year refill as of 12/11/23.    4. Botulinum toxin is due 12/18- so at the end of her Botox  results. - so hard to tell how good it is overall.   5.  Surgery for L hand- not sure if appropriate- since hand nonfunctional-  6. I do suggest using washcloth to keep hand open more than right now. Could also use kitchen sponge cut in half so folds in half- so that would be great for L hand.   7. F/U in 3 months- has appt with Dr Gunnar 01/22/24 for Botulinum toxin.

## 2024-01-14 NOTE — Progress Notes (Signed)
 Complex Care Management Note  Care Guide Note 01/14/2024 Name: Lori [name] MRN: [id number] DOB: [birth year]  Lori [name] is a 53 y.o. year old female who sees Ngetich, Dinah C, NP for primary care. I reached out to Lori [name] by phone today to offer complex care management services.  [name] was given information about Complex Care Management services today including:   The Complex Care Management services include support from the care team which includes your Nurse Care Manager, Clinical Social Worker, or Pharmacist.  The Complex Care Management team is here to help remove barriers to the health concerns and goals most important to you. Complex Care Management services are voluntary, and the patient may decline or stop services at any time by request to their care team member.   Complex Care Management Consent Status: Patient did not agree to participate in complex care management services at this time.  Follow up plan:  Unsuccessful telephone outreach attempt made. A HIPAA compliant phone message was left for the patient providing contact information and requesting a return call.  Encounter Outcome:  Patient Refused  .Debbe Fuse [facility], [facility] Guide  Direct Dial : [phone]  Fax [phone]

## 2024-01-14 NOTE — Telephone Encounter (Addendum)
 Updated patient's pharmacy to [facility] in [place]

## 2024-01-14 NOTE — Telephone Encounter (Signed)
 PT returned call and confirmed that she wants to have [facility] to be her pharmacy.

## 2024-01-19 ENCOUNTER — Other Ambulatory Visit: Payer: Self-pay

## 2024-01-20 ENCOUNTER — Telehealth: Payer: Self-pay

## 2024-01-20 DIAGNOSIS — B372 Candidiasis of skin and nail: Secondary | ICD-10-CM

## 2024-01-20 DIAGNOSIS — I639 Cerebral infarction, unspecified: Secondary | ICD-10-CM

## 2024-01-20 DIAGNOSIS — R112 Nausea with vomiting, unspecified: Secondary | ICD-10-CM

## 2024-01-20 DIAGNOSIS — G43019 Migraine without aura, intractable, without status migrainosus: Secondary | ICD-10-CM

## 2024-01-20 DIAGNOSIS — F419 Anxiety disorder, unspecified: Secondary | ICD-10-CM

## 2024-01-20 DIAGNOSIS — K5901 Slow transit constipation: Secondary | ICD-10-CM

## 2024-01-20 DIAGNOSIS — I10 Essential (primary) hypertension: Secondary | ICD-10-CM

## 2024-01-20 DIAGNOSIS — B370 Candidal stomatitis: Secondary | ICD-10-CM

## 2024-01-20 MED ORDER — NYSTATIN 100000 UNIT/GM EX CREA: 1.0000 | TOPICAL_CREAM | Freq: Two times a day (BID) | CUTANEOUS | 0 refills | Status: AC

## 2024-01-20 MED ORDER — METOCLOPRAMIDE HCL 10 MG PO TABS: 10.0000 mg | ORAL_TABLET | Freq: Four times a day (QID) | ORAL | 3 refills | Status: AC

## 2024-01-20 MED ORDER — SENNOSIDES-DOCUSATE SODIUM 8.6-50 MG PO TABS: 2.0000 | ORAL_TABLET | Freq: Two times a day (BID) | ORAL | 1 refills | Status: AC

## 2024-01-20 MED ORDER — OLMESARTAN MEDOXOMIL-HCTZ 40-25 MG PO TABS: 1.0000 | ORAL_TABLET | Freq: Every day | ORAL | 1 refills | Status: DC

## 2024-01-20 MED ORDER — CYCLOBENZAPRINE HCL 5 MG PO TABS: 5.0000 mg | ORAL_TABLET | Freq: Three times a day (TID) | ORAL | 1 refills | Status: AC | PRN

## 2024-01-20 MED ORDER — ATORVASTATIN CALCIUM 80 MG PO TABS: 80.0000 mg | ORAL_TABLET | Freq: Every evening | ORAL | 1 refills | Status: AC

## 2024-01-20 MED ORDER — ESCITALOPRAM OXALATE 20 MG PO TABS: 20.0000 mg | ORAL_TABLET | Freq: Every morning | ORAL | 1 refills | Status: AC

## 2024-01-20 MED ORDER — TOPIRAMATE 25 MG PO TABS: 25.0000 mg | ORAL_TABLET | Freq: Two times a day (BID) | ORAL | 1 refills | Status: AC

## 2024-01-20 MED ORDER — CLOPIDOGREL BISULFATE 75 MG PO TABS: 75.0000 mg | ORAL_TABLET | Freq: Every day | ORAL | 1 refills | Status: AC

## 2024-01-20 MED ORDER — GABAPENTIN 100 MG PO CAPS: 100.0000 mg | ORAL_CAPSULE | Freq: Three times a day (TID) | ORAL | 1 refills | Status: AC

## 2024-01-20 MED ORDER — RIVAROXABAN 10 MG PO TABS: 10.0000 mg | ORAL_TABLET | Freq: Every day | ORAL | 1 refills | Status: AC

## 2024-01-20 MED ORDER — ONDANSETRON 4 MG PO TBDP: 4.0000 mg | ORAL_TABLET | Freq: Three times a day (TID) | ORAL | 3 refills | Status: DC | PRN

## 2024-01-20 MED ORDER — EZETIMIBE 10 MG PO TABS: 10.0000 mg | ORAL_TABLET | Freq: Every day | ORAL | 1 refills | Status: AC

## 2024-01-20 MED ORDER — BACLOFEN 10 MG PO TABS: 10.0000 mg | ORAL_TABLET | Freq: Three times a day (TID) | ORAL | 1 refills | Status: AC

## 2024-01-20 MED ORDER — TAMSULOSIN HCL 0.4 MG PO CAPS: 0.4000 mg | ORAL_CAPSULE | Freq: Every day | ORAL | 1 refills | Status: AC

## 2024-01-20 MED ORDER — ALBUTEROL SULFATE HFA 108 (90 BASE) MCG/ACT IN AERS: INHALATION_SPRAY | RESPIRATORY_TRACT | 3 refills | Status: AC

## 2024-01-20 MED ORDER — PANTOPRAZOLE SODIUM 40 MG PO TBEC: 40.0000 mg | DELAYED_RELEASE_TABLET | Freq: Two times a day (BID) | ORAL | 1 refills | Status: AC

## 2024-01-20 MED ORDER — METOPROLOL TARTRATE 50 MG PO TABS: 50.0000 mg | ORAL_TABLET | Freq: Two times a day (BID) | ORAL | 1 refills | Status: AC

## 2024-01-20 NOTE — Telephone Encounter (Signed)
 Noted

## 2024-01-20 NOTE — Telephone Encounter (Signed)
 Copied from CRM #[id number]. Topic: Clinical - Prescription Issue >> Jan 08, 2024  9:04 AM Graeme ORN wrote: Reason for CRM: [facility] called to check status of faxed request. Patient recently enrolled and they are request all active medications be sent to their pharmacy. Phone: [phone] Pharmacy: [phone] Fax: [phone] >> Jan 16, 2024  3:37 PM [name] wrote: Max from [employer] is calling in requesting all of the patients medications be faxed to them at Fax [phone], as they can not get a fax to go through to the office as it shows an error for our number. Please advise Max at [phone]

## 2024-01-20 NOTE — Telephone Encounter (Signed)
 Spoke with patient to confirm that she has requested this action, patient confirmed.  RX's sent that Dinah Prescribes with the exception of Lunesta  (controlled). Sending Lunesta  rx to Dinah for review and approval   Patient is requesting a refill on Lunesta :    Date of last refill: 11/04/23  Refill amount: 30/1  Treatment agreement date: No treatment agreement on file, notation made on pending appointment for March 2026

## 2024-01-21 ENCOUNTER — Other Ambulatory Visit: Payer: Self-pay | Admitting: Family

## 2024-01-21 DIAGNOSIS — F419 Anxiety disorder, unspecified: Secondary | ICD-10-CM

## 2024-01-22 ENCOUNTER — Other Ambulatory Visit: Payer: Self-pay | Admitting: Adult Health

## 2024-01-22 ENCOUNTER — Encounter: Payer: Self-pay | Admitting: Physical Medicine & Rehabilitation

## 2024-01-22 ENCOUNTER — Encounter: Admitting: Physical Medicine & Rehabilitation

## 2024-01-22 VITALS — BP 114/83 | HR 118 | Ht 62.0 in

## 2024-01-22 DIAGNOSIS — I639 Cerebral infarction, unspecified: Secondary | ICD-10-CM

## 2024-01-22 DIAGNOSIS — G8114 Spastic hemiplegia affecting left nondominant side: Secondary | ICD-10-CM

## 2024-01-22 DIAGNOSIS — F5101 Primary insomnia: Secondary | ICD-10-CM

## 2024-01-22 MED ORDER — SODIUM CHLORIDE (PF) 0.9 % IJ SOLN
2.0000 mL | Freq: Once | INTRAMUSCULAR | Status: AC
  Administered 2024-01-22: 14:00:00 2 mL

## 2024-01-22 MED ORDER — ESZOPICLONE 2 MG PO TABS: 2.0000 mg | ORAL_TABLET | Freq: Every evening | ORAL | 0 refills | Status: DC | PRN

## 2024-01-22 MED ORDER — SODIUM CHLORIDE (PF) 0.9 % IJ SOLN
6.0000 mL | Freq: Once | INTRAMUSCULAR | Status: AC
  Administered 2024-01-22: 13:00:00 6 mL

## 2024-01-22 MED ORDER — ONABOTULINUMTOXINA 100 UNITS IJ SOLR
100.0000 [IU] | Freq: Once | INTRAMUSCULAR | Status: AC
  Administered 2024-01-22: 14:00:00 100 [IU] via INTRAMUSCULAR

## 2024-01-22 MED ADMIN — OnabotulinumtoxinA For Inj 100 Unit: 300 [IU] | INTRAMUSCULAR | @ 13:00:00 | NDC 00023114501

## 2024-01-22 NOTE — Telephone Encounter (Signed)
 Sent [facility]Rx for Lunesta .

## 2024-01-22 NOTE — Telephone Encounter (Signed)
 Copied from CRM #[id number]. Topic: Clinical - Prescription Issue >> Jan 22, 2024  3:28 PM Miquel SAILOR wrote: Reason for CRM: [facility] [phone] Max direct # [phone] -Requesting These medication did not get transferred to location. Needs these transferred over    Continuous Glucose Sensor (FREESTYLE LIBRE 3 PLUS SENSOR) MISC eszopiclone  (LUNESTA ) 2 MG TABS tablet methimazole  (TAPAZOLE ) 5 MG tablet  Location:[facility] ([place] [place], [place] - [address] [place] [address] Phone: [phone] Fax: [phone] Hours: Not open 24 hours

## 2024-01-22 NOTE — Telephone Encounter (Signed)
 1.) Patient will need to call endocrinologist about freestyle libre sensor and methimazole .   2.) [place] will you send rx for Lunesta  (information was previously sent to Dinah   3.Patient is aware to call endocrinologist.

## 2024-01-22 NOTE — Progress Notes (Signed)
 Botox  Injection for spasticity using needle EMG guidance  Dilution: 50 Units/ml Indication: Severe spasticity which interferes with ADL,mobility and/or  hygiene and is unresponsive to medication management and other conservative care Informed consent was obtained after describing risks and benefits of the procedure with the patient. This includes bleeding, bruising, infection, excessive weakness, or medication side effects. A REMS form is on file and signed. Chlorhexidine  prep was used because patient complains of iodine allergy causing itching Needle: 27g 1 needle electrode Number of units per muscle Biceps50 2+ Brachialis 50 1+ ECRL 75 2+  FDS50 2+ FDP50 1+ Opponens pollicis 25 2+ Lumbricals 75 0-1+, will consider not re injecting PT 25 2+ All injections were done after obtaining appropriate EMG activity and after negative drawback for blood. The patient tolerated the procedure well. Post procedure instructions were given. A followup appointment was made.

## 2024-01-22 NOTE — Patient Instructions (Signed)
 You received a Botox  injection today. You may experience soreness at the needle injection sites. Please call us  if any of the injection sites turns red after a couple days or if there is any drainage. You may experience muscle weakness as a result of Botox . This would improve with time but can take several weeks to improve. The Botox  should start working in about one week. The Botox  usually last 3 months. The injection can be repeated every 3 months as needed.

## 2024-01-23 NOTE — Addendum Note (Signed)
 Addended by: SUELLEN DEVIN BROCKS on: 01/23/2024 10:42 AM   Modules accepted: Orders

## 2024-01-26 ENCOUNTER — Telehealth: Payer: Self-pay

## 2024-01-26 NOTE — Telephone Encounter (Signed)
 Copied from CRM #[id number]. Topic: Clinical - Home Health Verbal Orders >> Jan 26, 2024 12:01 PM Cherylann RAMAN wrote: Caller/Agency: [facility] Callback Number: [phone] VM confidential may leave detailed msg Service Requested: Physical Therapy Frequency: 1 week 8 Any new concerns about the patient? No

## 2024-01-27 ENCOUNTER — Telehealth: Payer: Self-pay | Admitting: Gastroenterology

## 2024-01-27 ENCOUNTER — Other Ambulatory Visit: Payer: Self-pay

## 2024-01-27 DIAGNOSIS — D649 Anemia, unspecified: Secondary | ICD-10-CM | POA: Diagnosis not present

## 2024-01-27 DIAGNOSIS — A419 Sepsis, unspecified organism: Secondary | ICD-10-CM | POA: Diagnosis not present

## 2024-01-27 DIAGNOSIS — I5032 Chronic diastolic (congestive) heart failure: Secondary | ICD-10-CM | POA: Diagnosis not present

## 2024-01-27 DIAGNOSIS — G4733 Obstructive sleep apnea (adult) (pediatric): Secondary | ICD-10-CM | POA: Diagnosis not present

## 2024-01-27 DIAGNOSIS — E66811 Obesity, class 1: Secondary | ICD-10-CM | POA: Diagnosis not present

## 2024-01-27 DIAGNOSIS — I69354 Hemiplegia and hemiparesis following cerebral infarction affecting left non-dominant side: Secondary | ICD-10-CM | POA: Diagnosis not present

## 2024-01-27 DIAGNOSIS — I2510 Atherosclerotic heart disease of native coronary artery without angina pectoris: Secondary | ICD-10-CM | POA: Diagnosis not present

## 2024-01-27 DIAGNOSIS — Z794 Long term (current) use of insulin: Secondary | ICD-10-CM | POA: Diagnosis not present

## 2024-01-27 DIAGNOSIS — F32A Depression, unspecified: Secondary | ICD-10-CM | POA: Diagnosis not present

## 2024-01-27 DIAGNOSIS — N3000 Acute cystitis without hematuria: Secondary | ICD-10-CM | POA: Diagnosis not present

## 2024-01-27 DIAGNOSIS — I110 Hypertensive heart disease with heart failure: Secondary | ICD-10-CM | POA: Diagnosis not present

## 2024-01-27 DIAGNOSIS — J4520 Mild intermittent asthma, uncomplicated: Secondary | ICD-10-CM | POA: Diagnosis not present

## 2024-01-27 DIAGNOSIS — E1169 Type 2 diabetes mellitus with other specified complication: Secondary | ICD-10-CM

## 2024-01-27 MED ORDER — INSULIN PEN NEEDLE 32G X 4 MM MISC: 3 refills | Status: AC

## 2024-01-27 NOTE — Telephone Encounter (Signed)
 Called [facility].  Let them know that the patient would need to confirm that she wants a refill of the sucralfate  for us  to send it.  We haven't seen her since March.  If she would like more she can have [facility] send us  a electronic request and we would fill it for her. Max said he would reach out to the patient and see if she is still using it and if she wants a refill.

## 2024-01-27 NOTE — Telephone Encounter (Signed)
 Inbound from [facility] calling in regards to this patient Sucralfate . Rep Name is Max and a good call back number for them is [phone]. Please advise.

## 2024-01-30 ENCOUNTER — Emergency Department (HOSPITAL_COMMUNITY)
Admission: EM | Admit: 2024-01-30 | Discharge: 2024-01-30 | Disposition: A | Discharge status: Home / Self Care | Attending: Emergency Medicine | Admitting: Emergency Medicine

## 2024-01-30 ENCOUNTER — Emergency Department (HOSPITAL_COMMUNITY)

## 2024-01-30 ENCOUNTER — Other Ambulatory Visit: Payer: Self-pay

## 2024-01-30 DIAGNOSIS — R4182 Altered mental status, unspecified: Secondary | ICD-10-CM | POA: Diagnosis not present

## 2024-01-30 DIAGNOSIS — N3000 Acute cystitis without hematuria: Secondary | ICD-10-CM | POA: Diagnosis not present

## 2024-01-30 DIAGNOSIS — R4781 Slurred speech: Secondary | ICD-10-CM | POA: Insufficient documentation

## 2024-01-30 DIAGNOSIS — Z794 Long term (current) use of insulin: Secondary | ICD-10-CM | POA: Insufficient documentation

## 2024-01-30 DIAGNOSIS — Z86718 Personal history of other venous thrombosis and embolism: Secondary | ICD-10-CM | POA: Insufficient documentation

## 2024-01-30 DIAGNOSIS — E119 Type 2 diabetes mellitus without complications: Secondary | ICD-10-CM | POA: Diagnosis not present

## 2024-01-30 DIAGNOSIS — R82998 Other abnormal findings in urine: Secondary | ICD-10-CM | POA: Diagnosis present

## 2024-01-30 DIAGNOSIS — Z7901 Long term (current) use of anticoagulants: Secondary | ICD-10-CM | POA: Diagnosis not present

## 2024-01-30 LAB — URINALYSIS, ROUTINE W REFLEX MICROSCOPIC
Bilirubin Urine: NEGATIVE
Glucose, UA: NEGATIVE mg/dL
Ketones, ur: NEGATIVE mg/dL
Nitrite: NEGATIVE
Protein, ur: NEGATIVE mg/dL
Specific Gravity, Urine: 1.012 (ref 1.005–1.030)
pH: 5 (ref 5.0–8.0)

## 2024-01-30 LAB — RESP PANEL BY RT-PCR (RSV, FLU A&B, COVID)  RVPGX2
Influenza A by PCR: NEGATIVE
Influenza B by PCR: NEGATIVE
Resp Syncytial Virus by PCR: NEGATIVE
SARS Coronavirus 2 by RT PCR: NEGATIVE

## 2024-01-30 LAB — CBG MONITORING, ED: Glucose-Capillary: 137 mg/dL — ABNORMAL HIGH (ref 70–99)

## 2024-01-30 MED ORDER — PROCHLORPERAZINE EDISYLATE 10 MG/2ML IJ SOLN: 10.0000 mg | Freq: Once | INTRAMUSCULAR | Status: DC

## 2024-01-30 MED ORDER — SULFAMETHOXAZOLE-TRIMETHOPRIM 800-160 MG PO TABS: 1.0000 | ORAL_TABLET | Freq: Two times a day (BID) | ORAL | 0 refills | Status: AC

## 2024-01-30 MED ORDER — SULFAMETHOXAZOLE-TRIMETHOPRIM 800-160 MG PO TABS
1.0000 | ORAL_TABLET | Freq: Once | ORAL | Status: AC
  Administered 2024-01-30: 1 via ORAL
  Filled 2024-01-30: qty 1

## 2024-01-30 MED ORDER — SULFAMETHOXAZOLE-TRIMETHOPRIM 800-160 MG PO TABS: 1.0000 | ORAL_TABLET | Freq: Two times a day (BID) | ORAL | 0 refills | Status: DC

## 2024-01-30 NOTE — ED Provider Notes (Signed)
 " [place] [facility] Provider Note   CSN: [id number] Arrival date & time: 01/30/24  1211     Patient presents with: Altered Mental Status   Lori [name] is a 53 y.o. 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  Patient was brought in by ambulance from home, where her spouse was concerned as she has had confusion since waking up earlier this morning.  (Update -supplemental history is provided initially by EMS, subsequently by her husband, who I was able to reach by phone after multiple attempts.  They confirmed that the patient is at her baseline mental status which includes left side hemiplegia and intermittent slurred speech.  Her husband specifically was concerned that the patient seemed confused this morning.  She seemed to be behaving more normally last night.  She does have a history of UTIs.)  I reviewed her external records.  She does have a history of urinary tract infections and confusions, most recently hospitalized in October of this year for UTI that progressed to sepsis.  However blood cultures were negative subsequently.    She also has a history of DVT and takes Xarelto  per her medication list   HPI     Prior to Admission medications  Medication Sig Start Date End Date Taking? Authorizing Provider  acetaminophen  (TYLENOL ) 325 MG tablet Take 2 tablets (650 mg total) by mouth every 6 (six) hours as needed for mild pain (pain score 1-3) (or Fever >/= 101). 02/12/23   Sebastian Toribio GAILS, MD  albuterol  (VENTOLIN  HFA) 108 [phone] Base) MCG/ACT inhaler INHALE 2 PUFFS BY MOUTH EVERY 6 HOURS AS NEEDED FOR WHEEZING AND SHORTNESS OF BREATH 01/20/24   Ngetich, Dinah C, NP  atorvastatin  (LIPITOR ) 80 MG tablet Take 1 tablet (80 mg total) by mouth every evening. 01/20/24   Ngetich, Dinah C, NP  baclofen   (LIORESAL ) 10 MG tablet Take 1 tablet (10 mg total) by mouth 3 (three) times daily. 01/20/24   Ngetich, Dinah C, NP  clopidogrel  (PLAVIX ) 75 MG tablet Take 1 tablet (75 mg total) by mouth daily. 01/20/24   Ngetich, Dinah C, NP  Continuous Glucose Sensor (FREESTYLE LIBRE 14 DAY SENSOR) MISC USE TO MONITOR BLOOD SUGAR AS DIRECTED. CHANGE SENSOR EVERY 14 DAYS. 12/12/23   Thapa, Sudan, MD  Continuous Glucose Sensor (FREESTYLE LIBRE 3 PLUS SENSOR) MISC 1 each by Does not apply route continuous. Change every 15 days. Patient taking differently: Inject 1 Device into the skin See admin instructions. Place 1 new sensor into the skin every 15 days 07/30/23   Thapa, Sudan, MD  Continuous Glucose Sensor (FREESTYLE LIBRE 3 PLUS SENSOR) MISC Change every 15 days 11/12/23   Thapa, Sudan, MD  cyclobenzaprine  (FLEXERIL ) 5 MG tablet Take 1 tablet (5 mg total) by mouth 3 (three) times daily as needed for muscle spasms. 01/20/24   Ngetich, Dinah C, NP  escitalopram  (LEXAPRO ) 20 MG tablet Take 1 tablet (20 mg total) by mouth every morning. 01/20/24   Ngetich, Dinah C, NP  eszopiclone  (LUNESTA ) 2 MG TABS tablet Take 1 tablet (2 mg total) by mouth at bedtime as needed for sleep. Take immediately before bedtime 01/22/24   Medina-Vargas, Monina C, NP  ezetimibe  (ZETIA ) 10 MG tablet Take 1 tablet (10 mg total) by mouth daily. 01/20/24   Ngetich, Dinah C, NP  gabapentin  (NEURONTIN ) 100 MG capsule Take 1 capsule (100  mg total) by mouth 3 (three) times daily. 01/20/24   Ngetich, Dinah C, NP  HUMALOG  KWIKPEN 100 UNIT/ML KwikPen 10-15 units with meals 3 times a day, maximum 45 units/day. Patient taking differently: Inject 10-15 Units into the skin 3 (three) times daily with meals. 09/10/23   Thapa, Sudan, MD  insulin  glargine (LANTUS  SOLOSTAR) 100 UNIT/ML Solostar Pen Inject 18 Units into the skin at bedtime. 09/10/23   Thapa, Sudan, MD  Insulin  Pen Needle 32G X 4 MM MISC Use 4x a day 01/27/24   Thapa, Sudan, MD  methimazole  (TAPAZOLE ) 5  MG tablet Take 1 tablet (5 mg total) by mouth every morning. 07/01/23   Thapa, Sudan, MD  metoCLOPramide  (REGLAN ) 10 MG tablet Take 1 tablet (10 mg total) by mouth every 6 (six) hours. 01/20/24   Ngetich, Dinah C, NP  metoprolol  tartrate (LOPRESSOR ) 50 MG tablet Take 1 tablet (50 mg total) by mouth 2 (two) times daily. 01/20/24   Ngetich, Dinah C, NP  nystatin  (MYCOSTATIN ) 100000 UNIT/ML suspension Take 5 mLs (500,000 Units total) by mouth 4 (four) times daily. 10/27/23   Ngetich, Dinah C, NP  nystatin  cream (MYCOSTATIN ) Apply 1 Application topically 2 (two) times daily. 01/20/24   Ngetich, Dinah C, NP  olmesartan -hydrochlorothiazide  (BENICAR  HCT) 40-25 MG tablet Take 1 tablet by mouth daily. 01/20/24   Ngetich, Dinah C, NP  ondansetron  (ZOFRAN -ODT) 4 MG disintegrating tablet Take 1 tablet (4 mg total) by mouth every 8 (eight) hours as needed for nausea or vomiting (dissolve orally). 01/20/24   Ngetich, Dinah C, NP  pantoprazole  (PROTONIX ) 40 MG tablet Take 1 tablet (40 mg total) by mouth 2 (two) times daily. 01/20/24   Ngetich, Dinah C, NP  polyethylene glycol powder (GLYCOLAX /MIRALAX ) 17 GM/SCOOP powder Take 17 g by mouth daily. Patient taking differently: Take 17 g by mouth in the morning and at bedtime. 10/27/23   Ngetich, Dinah C, NP  rivaroxaban  (XARELTO ) 10 MG TABS tablet Take 1 tablet (10 mg total) by mouth daily. 01/20/24   Ngetich, Dinah C, NP  Semaglutide , 1 MG/DOSE, 4 MG/3ML SOPN Inject 1 mg as directed once a week. 11/26/23   Ngetich, Dinah C, NP  senna-docusate (SENOKOT-S) 8.6-50 MG tablet Take 2 tablets by mouth 2 (two) times daily. 01/20/24   Ngetich, Dinah C, NP  sulfamethoxazole -trimethoprim  (BACTRIM  DS) 800-160 MG tablet Take 1 tablet by mouth 2 (two) times daily for 3 days. 01/30/24 02/02/24  [name] PARAS, MD  tamsulosin  (FLOMAX ) 0.4 MG CAPS capsule Take 1 capsule (0.4 mg total) by mouth at bedtime. 01/20/24   Ngetich, Dinah C, NP  topiramate  (TOPAMAX ) 25 MG tablet Take 1 tablet  (25 mg total) by mouth 2 (two) times daily. 01/20/24   Ngetich, Dinah C, NP    Allergies: Asa [aspirin ], Contrast media [iodinated contrast media], Imitrex [sumatriptan], Ms contin  [morphine ], Penicillins, Chlorhexidine  gluconate [chlorhexidine ], Firvanq  [vancomycin ], Cipro  [ciprofloxacin  hcl], Nitrofuran derivatives, and Wound dressing adhesive    Review of Systems  Updated Vital Signs BP (!) 126/95   Pulse (!) 104   Temp 97.7 F (36.5 C) (Oral)   Resp 12   Ht 5' 2 (1.575 m)   Wt 70 kg   SpO2 97%   BMI 28.23 kg/m   Physical Exam Constitutional:      General: She is not in acute distress. HENT:     Head: Normocephalic and atraumatic.  Eyes:     Conjunctiva/sclera: Conjunctivae normal.     Pupils: Pupils are equal, round, and reactive to light.  Cardiovascular:     Rate and Rhythm: Normal rate and regular rhythm.  Pulmonary:     Effort: Pulmonary effort is normal. No respiratory distress.  Abdominal:     General: There is no distension.     Tenderness: There is no abdominal tenderness.  Skin:    General: Skin is warm and dry.  Neurological:     Mental Status: She is alert.     Comments: Speech is slow, patient is able to follow commands, left-sided hemiaplasia, right sided with generalized weak motor function, spasticity  Psychiatric:        Mood and Affect: Mood normal.        Behavior: Behavior normal.     (all labs ordered are listed, but only abnormal results are displayed) Labs Reviewed  URINALYSIS, ROUTINE W REFLEX MICROSCOPIC - Abnormal; Notable for the following components:      Result Value   APPearance CLOUDY (*)    Hgb urine dipstick LARGE (*)    Leukocytes,Ua MODERATE (*)    Bacteria, UA MANY (*)    All other components within normal limits  CBG MONITORING, ED - Abnormal; Notable for the following components:   Glucose-Capillary 137 (*)    All other components within normal limits  RESP PANEL BY RT-PCR (RSV, FLU A&B, COVID)  [id number]  URINE CULTURE     EKG: None  Radiology: CT Head Wo Contrast Result Date: 01/30/2024 EXAM: CT HEAD WITHOUT CONTRAST 01/30/2024 01:00:00 PM TECHNIQUE: CT of the head was performed without the administration of intravenous contrast. Automated exposure control, iterative reconstruction, and/or weight based adjustment of the mA/kV was utilized to reduce the radiation dose to as low as reasonably achievable. COMPARISON: CT head 01/30/2023. CLINICAL HISTORY: Headache, neuro deficit; hx of complex migraines, multiple strokes, here with headache and difficulty with speech. FINDINGS: BRAIN AND VENTRICLES: No acute hemorrhage. No evidence of acute infarct. Unchanged background of moderate chronic small vessel disease with old infarct along the left precentral gyrus. Old left inferior cerebellar infarct. No hydrocephalus. No extra-axial collection. No mass effect or midline shift. ORBITS: No acute abnormality. SINUSES: No acute abnormality. SOFT TISSUES AND SKULL: No acute soft tissue abnormality. No skull fracture. IMPRESSION: 1. No acute intracranial abnormality. Electronically signed by: Ryan Chess MD 01/30/2024 01:05 PM EST RP Workstation: [id number]     Procedures   Medications Ordered in the ED  sulfamethoxazole -trimethoprim  (BACTRIM  DS) 800-160 MG per tablet 1 tablet (1 tablet Oral Given 01/30/24 1527)    Clinical Course as of 01/30/24 1600  Fri Jan 30, 2024  1237 No answer by phone from husband x 2 or either daughter listed in emergency contacts [MT]  1246 Paged neurology [MT]  1306 Update after neurologist initially consulted, I was able to reach [facility] and the ambulance transport company to clarify that the patient has functional slow speech at baseline, is noted to be AT baseline from motor standpoint per family and EMS, and the only concern for confusion (she didn't recognize her husband). Cloudy urine reported at home from [place].  On reassessment now patient gives a much more reliable neuro exam, seems  to have improved strength in right arm and leg.  No longer concerned for LVO - neurology consult deferred, as she is more likely being worked up for confusion/UTI [MT]  1528 I spoke to [employer] her daughter who does not live with her mother, but did confirm the patient's husband's phone number is correct in her chart.  She is aware the patient is  being treated for UTI in the hospital.  She said she spoke to the patient yesterday by phone and the patient was at baseline mental status, which include some slurred speech from her prior stroke.  She is agreeable with plan for treatment with antibiotics and discharge, which is the patient strong preference.  The patient herself seems much more lucid on my reassessment, and feels she has a UTI, and I think it is reasonable to treat her with Bactrim  and discharged her, she strongly does not want to stay in the hospital.  Unfortunately, I am still not able to reach her husband by phone.  We will continue attempts. [MT]  1551 I spoke to her husband by phone and updated the MDM [MT]    Clinical Course User Index [MT] Alexzandra Bilton, Donnice PARAS, MD                                 Medical Decision Making Amount and/or Complexity of Data Reviewed Labs: ordered. Radiology: ordered.  Risk Prescription drug management.   This patient presents to the ED with concern for behavioral change, confusion. This involves an extensive number of treatment options, and is a complaint that carries with it a high risk of complications and morbidity.  The differential diagnosis includes urinary tract infection versus metabolic derangement versus CVA versus other  Viral illness including COVID and influenza are also on the differential  Co-morbidities that complicate the patient evaluation: History of multiple strokes, stroke risk factors, history of UTI  Additional history obtained from EMS, patient's husband by phone    I ordered and personally interpreted labs.  The  pertinent results include: Catheterized UA with moderate leukocytes, many bacteria, consistent with likely urinary tract infection.  Covid and Flu test negative  I ordered imaging studies including CT head I independently visualized and interpreted imaging which showed no emergent findings I agree with the radiologist interpretation  I ordered medication including Bactrim  for suspected UTI  I have reviewed the patients home medicines and have made adjustments as needed  Test Considered: Low suspicion for acute meningitis, bacteremia, sepsis, acute PE, or other life-threatening condition  Ultimately I had a discussion with the patient as well as her husband regarding the possibility of urinary tract infection causing her confusion.  Her blood pressure is stable here, and her mental function appears to be fine.  On her initial presentation her speech did seem somewhat slower to me, which prompted a broader workup including CT scan of the head.  However on reassessment she appeared to have spontaneously improved.  Patient's blood sugar is within normal limits here, 137.  The patient is quite adamant that she is wanting to go home, and not wanting to stay in the hospital.  Hospitalization was considered given her reported waxing and waning confusion from home to the ED.  However, I think it is reasonable to treat for UTI at home with oral antibiotics.  I did explain that if her symptoms get worse she will need to return to the hospital.  This was also conveyed to her husband by phone.  She will need [facility]AR to return to the house.  There were several attempts made for labs, which were unsuccessful due to poor veins. Patient bruising easily on xarelto . I have a lower suspicion this is related to metabolic derangement or anemia, with her glucose also being essentially normal, or DKA, with no ketones noted in her  urine.  I do not think further attempts are needed for blood work at this  time.  Disposition:  After consideration of the diagnostic results and the patients response to treatment, I feel that the patent would benefit from close outpatient follow-up.      Final diagnoses:  Acute cystitis without hematuria    ED Discharge Orders          Ordered    sulfamethoxazole -trimethoprim  (BACTRIM  DS) 800-160 MG tablet  2 times daily,   Status:  Discontinued        01/30/24 1541    sulfamethoxazole -trimethoprim  (BACTRIM  DS) 800-160 MG tablet  2 times daily        01/30/24 1541               [name] PARAS, MD 01/30/24 1600  "

## 2024-01-30 NOTE — Progress Notes (Signed)
 Arrived to follow up on need for IV and see if second assess by IV team is needed.  Per [place], RN provider is speaking with patient and no IV needed at this time.  Will complete consult.  Reorder if something changes with patient needs.

## 2024-01-30 NOTE — ED Triage Notes (Addendum)
 Pt BIBA from home, per spouse pt has had increased AMS today since waking up. Pt has hx of stroke and UTI. Pt reports she didn't sleep well last night, now feels like she's having trouble speaking and seeing straight. Pt answers questions appropriately. Denies pain other than chronic pain in left arm, denies pain w/ urination, fever, n/v/d.  VSS, NAD

## 2024-01-30 NOTE — ED Notes (Signed)
 Pt to CT

## 2024-01-30 NOTE — ED Notes (Signed)
 IV team unable to obtain IV access. MD made aware, MD to attempt USGIV insertion.

## 2024-01-30 NOTE — ED Provider Notes (Signed)
 I have advised CT technician to forego labs in need of urgent CT angiogram per my discussion with neurologist. Last Cr wnl in Oct 2025 check.   [name] PARAS, MD 01/30/24 1257

## 2024-01-30 NOTE — ED Notes (Signed)
 Straight cath completed using sterile technique after intensive peri care was performed. Pt covered in stool. New brief applied.

## 2024-01-30 NOTE — ED Notes (Signed)
 IV team at bedside

## 2024-01-30 NOTE — ED Notes (Signed)
 MD spoke to pt spouse via phone, he will be home around 1700 tonight to assist pt. Pt has 24/7 care at home. [facility] called for transportation home as pt is bed bound. Pt and spouse aware of plan and verbalized understanding.

## 2024-01-30 NOTE — ED Notes (Signed)
 P[facility] called

## 2024-01-30 NOTE — Discharge Instructions (Addendum)
 We did a CT scan of Lori [name]'s brain which did not show signs of any new stroke.  Her urine sample was suggestive of an infection.  She was started on Bactrim  and antibiotic for UTI.  I did prescribe her 3 more days of Bactrim , and sent a prescription to her local pharmacy in [place].  I also gave her printed prescription as an alternative.  This can be taken to any local pharmacy and filled.  Her next dose of this antibiotic would be 01/31/24 in the morning.

## 2024-02-02 ENCOUNTER — Telehealth: Payer: Self-pay

## 2024-02-02 ENCOUNTER — Ambulatory Visit: Payer: Self-pay

## 2024-02-02 ENCOUNTER — Other Ambulatory Visit: Payer: Self-pay | Admitting: Family

## 2024-02-02 DIAGNOSIS — B372 Candidiasis of skin and nail: Secondary | ICD-10-CM

## 2024-02-02 LAB — URINE CULTURE: Culture: >=100000 — AB

## 2024-02-02 NOTE — Telephone Encounter (Signed)
 Copied from CRM #[id number]. Topic: General - Other >> Jan 30, 2024  3:58 PM Diannia H wrote: Reason for CRM: Mitzie to patient PT called to let the provider know that she went out to start her PT can someone said she went to the hospital.   Callback number Mitzie [phone].

## 2024-02-02 NOTE — Telephone Encounter (Signed)
 [name] or Action Required?: [name] for provider: appointment scheduled on 12/30 for acute visit for cough, .  Patient was last seen in primary care on 11/26/2023 by Lori [name], Lori BROCKS, NP.  Called Nurse Triage reporting Cough.  Symptoms began several weeks ago.  Interventions attempted: Nothing.  Symptoms are: gradually worsening.  Triage Disposition: See Physician Within 24 Hours  Patient/caregiver understands and will follow disposition?: Yes   Onset of cough a couple weeks ago. Clear sputum, no blood. No SOB or CP or fever. Has hx of asthma, denies feeling like she's having an attack. Cough has been getting worse. No fever. Pt noted to have slurred speech over the phone. Hx of CVA in 2023 with slurred speech since. Noted to have been in ED on 12/26 for cystitis, slurred speech noted at that time and was reported to be at baseline mentally by family. Pt allert and oriented and answering questions appropriately. Scheduled acute ED f/u appt with different provider at home office d/t no PCP availability within timeframe for acute visit. Advised UC or ED for worsening symptoms.     Message from [place] B sent at 02/02/2024  1:45 PM EST  Summary: [id number]: Beza Becton chronic couging   Reason for Triage: Chronic cough, coughing up phlegm, no fever or anything         Reason for Disposition  [1] Known COPD or other severe lung disease (i.e., bronchiectasis, cystic fibrosis, lung surgery) AND [2] symptoms getting worse (i.e., increased sputum purulence or amount, increased breathing difficulty    Hx of asthma  Answer Assessment - Initial Assessment Questions 1. ONSET: When did the cough begin?      2 weeks ago  2. SEVERITY: How bad is the cough today?      Moderate  3. SPUTUM: Describe the color of your sputum (e.g., none, dry cough; clear, white, yellow, green)     Clear  4. HEMOPTYSIS: Are you coughing up any blood? If Yes, ask: How much? (e.g., flecks,  streaks, tablespoons, etc.)     Denies  5. DIFFICULTY BREATHING: Are you having difficulty breathing? If Yes, ask: How bad is it? (e.g., mild, moderate, severe)      Denies  6. FEVER: Do you have a fever? If Yes, ask: What is your temperature, how was it measured, and when did it start?     Denies  7. CARDIAC HISTORY: Do you have any history of heart disease? (e.g., heart attack, congestive heart failure)      CVA, CHF, CAD  8. LUNG HISTORY: Do you have any history of lung disease?  (e.g., pulmonary embolus, asthma, emphysema)     Asthma  9. PE RISK FACTORS: Do you have a history of blood clots? (or: recent major surgery, recent prolonged travel, bedridden)     Hx of DVT in leg  10. OTHER SYMPTOMS: Do you have any other symptoms? (e.g., runny nose, wheezing, chest pain)       Eyes are watery  Protocols used: Cough - Acute Productive-A-AH

## 2024-02-03 ENCOUNTER — Ambulatory Visit: Admitting: Nurse Practitioner

## 2024-02-03 ENCOUNTER — Encounter: Payer: Self-pay | Admitting: Nurse Practitioner

## 2024-02-03 ENCOUNTER — Telehealth (HOSPITAL_BASED_OUTPATIENT_CLINIC_OR_DEPARTMENT_OTHER): Payer: Self-pay | Admitting: *Deleted

## 2024-02-03 ENCOUNTER — Ambulatory Visit (INDEPENDENT_AMBULATORY_CARE_PROVIDER_SITE_OTHER): Admitting: Nurse Practitioner

## 2024-02-03 VITALS — BP 128/86 | HR 96 | Temp 97.2°F

## 2024-02-03 DIAGNOSIS — N3000 Acute cystitis without hematuria: Secondary | ICD-10-CM | POA: Diagnosis not present

## 2024-02-03 DIAGNOSIS — R059 Cough, unspecified: Secondary | ICD-10-CM | POA: Diagnosis not present

## 2024-02-03 DIAGNOSIS — E1169 Type 2 diabetes mellitus with other specified complication: Secondary | ICD-10-CM | POA: Diagnosis not present

## 2024-02-03 DIAGNOSIS — Z794 Long term (current) use of insulin: Secondary | ICD-10-CM

## 2024-02-03 NOTE — Telephone Encounter (Signed)
 Post ED Visit - Positive Culture Follow-up: Successful Patient Follow-Up  Culture assessed and recommendations reviewed by:  [x]  Leonor Bash, Pharm.D. []  Venetia Gully, Pharm.D., BCPS AQ-ID []  Garrel Crews, Pharm.D., BCPS []  Almarie Lunger, Pharm.D., BCPS []  [place], [place].D., BCPS, AAHIVP []  Rosaline Bihari, Pharm.D., BCPS, AAHIVP []  Vernell Meier, PharmD, BCPS []  Latanya Hint, PharmD, BCPS []  Donald Medley, PharmD, BCPS []  [name], PharmD  Positive urine culture  []  Patient discharged without antimicrobial prescription and treatment is now indicated [x]  Organism is resistant to prescribed ED discharge antimicrobial []  Patient with positive blood cultures  Changes discussed with ED provider: Warren Shad, PA-C New antibiotic prescription: Levofloxacin  500 mg po Q 24 hrs x 3 days Called to [facility] on [address].  Contacted patient, date 02/03/24, time 1340   Lori [name] 02/03/2024, 1:42 PM

## 2024-02-03 NOTE — Progress Notes (Unsigned)
 "   Careteam: Patient Care Team: Ngetich, [name], NP as PCP - General (Family Medicine) Thukkani, Arun K, MD as PCP - Cardiology (Cardiology) [facility] Thapa, Sudan, MD as Consulting Physician (Endocrinology)  PLACE OF SERVICE:  [facility] CLINIC  Advanced Directive information    Allergies[1]  Chief Complaint  Patient presents with   Cough    Cough, Congestion for 2 Weeks.     HPI:  Discussed the use of AI scribe software for clinical note transcription with the patient, who gave verbal consent to proceed.  History of Present Illness Lori [name] is a 53 year old female who presents with a two-week history of cough and chest congestion.  She has been experiencing a persistent cough for two weeks, producing mucus. No fever, body aches, chills, wheezing, or shortness of breath. She feels chest congestion but denies nasal congestion or sore throat. She has not taken any medication for the cough and is unsure of what to take.  Recently, she was in the ED for a urinary tract infection and was discharged after initial treatment with antibiotics. The burning sensation with urination has resolved. She was prescribed a different antibiotic, Levaquin  500 mg daily for three days, after being contacted by her doctor regarding her urinary tract infection as the first antibiotic did not cover infection.   This morning, her blood sugar dropped to 45 before breakfast. She typically eats around 6-7 PM and did not eat breakfast before the drop. She is on Ozempic  and Humalog , with Humalog  dosed at 10 units. Diabetes managed by endocrinologist.      Review of Systems:  Review of Systems  Constitutional:  Negative for chills, fever and weight loss.  HENT:  Negative for tinnitus.   Respiratory:  Negative for cough, sputum production and shortness of breath.   Cardiovascular:  Negative for chest pain, palpitations and leg swelling.  Gastrointestinal:   Negative for abdominal pain, constipation, diarrhea and heartburn.  Genitourinary:  Negative for dysuria, frequency and urgency.  Musculoskeletal:  Negative for back pain, falls, joint pain and myalgias.  Skin: Negative.   Neurological:  Negative for dizziness and headaches.  Psychiatric/Behavioral:  Negative for depression and memory loss. The patient does not have insomnia.     Past Medical History:  Diagnosis Date   Acute bronchitis 08/24/2021   Acute cystitis 12/26/2022   Acute metabolic encephalopathy 11/04/2022   Acute renal failure (ARF) 04/27/2023   AKI (acute kidney injury) 04/30/2022   Aspiration pneumonia (HCC) 10/13/2022   CAD (coronary artery disease)    Cerebral infarction, unspecified (HCC) 09/27/2012   Chest pain of uncertain etiology 10/06/2022   Depression    Diabetes mellitus without complication (HCC)    Diabetic hyperosmolar non-ketotic state (HCC) 01/19/2023   DKA (diabetic ketoacidosis) (HCC) 02/19/2021   History of CT scan    History of mammogram    History of MRI    Hypertension    Pheochromocytoma    Reversible cerebrovascular vasoconstriction syndrome    Spell of abnormal behavior    Stroke [facility])    Thyroid  disease    UTI (urinary tract infection) 11/2023   Past Surgical History:  Procedure Laterality Date   ABDOMINAL HYSTERECTOMY     BIOPSY  02/06/2023   Procedure: BIOPSY;  Surgeon: Leigh Elspeth SQUIBB, MD;  Location: [facility] ENDOSCOPY;  Service: Gastroenterology;;   CESAREAN SECTION     3   CORONARY ARTERY BYPASS GRAFT     ESOPHAGOGASTRODUODENOSCOPY (EGD) WITH PROPOFOL  N/A  02/06/2023   Procedure: ESOPHAGOGASTRODUODENOSCOPY (EGD) WITH PROPOFOL ;  Surgeon: Leigh Elspeth SQUIBB, MD;  Location: [facility] ENDOSCOPY;  Service: Gastroenterology;  Laterality: N/A;   Right adrenal gland removal for pheochromocytoma Right    Social History:   reports that she has never smoked. She has never used smokeless tobacco. She reports that she does not currently use alcohol.  She reports that she does not use drugs.  Family History  Problem Relation Age of Onset   Hypertension Mother    Diabetes Mother    Atrial fibrillation Mother    Hypertension Father    Heart attack Father    Dementia Father    Diabetes Brother    Brain cancer Brother    Asthma Daughter    Sickle cell anemia Son    Atrial fibrillation Maternal Uncle     Medications: Patient's Medications  New Prescriptions   No medications on file  Previous Medications   ACETAMINOPHEN  (TYLENOL ) 325 MG TABLET    Take 2 tablets (650 mg total) by mouth every 6 (six) hours as needed for mild pain (pain score 1-3) (or Fever >/= 101).   ALBUTEROL  (VENTOLIN  HFA) 108 (90 BASE) MCG/ACT INHALER    INHALE 2 PUFFS BY MOUTH EVERY 6 HOURS AS NEEDED FOR WHEEZING AND SHORTNESS OF BREATH   ATORVASTATIN  (LIPITOR ) 80 MG TABLET    Take 1 tablet (80 mg total) by mouth every evening.   BACLOFEN  (LIORESAL ) 10 MG TABLET    Take 1 tablet (10 mg total) by mouth 3 (three) times daily.   CLOPIDOGREL  (PLAVIX ) 75 MG TABLET    Take 1 tablet (75 mg total) by mouth daily.   CONTINUOUS GLUCOSE SENSOR (FREESTYLE LIBRE 14 DAY SENSOR) MISC    USE TO MONITOR BLOOD SUGAR AS DIRECTED. CHANGE SENSOR EVERY 14 DAYS.   CONTINUOUS GLUCOSE SENSOR (FREESTYLE LIBRE 3 PLUS SENSOR) MISC    1 each by Does not apply route continuous. Change every 15 days.   CONTINUOUS GLUCOSE SENSOR (FREESTYLE LIBRE 3 PLUS SENSOR) MISC    Change every 15 days   CYCLOBENZAPRINE  (FLEXERIL ) 5 MG TABLET    Take 1 tablet (5 mg total) by mouth 3 (three) times daily as needed for muscle spasms.   ESCITALOPRAM  (LEXAPRO ) 20 MG TABLET    Take 1 tablet (20 mg total) by mouth every morning.   ESZOPICLONE  (LUNESTA ) 2 MG TABS TABLET    Take 1 tablet (2 mg total) by mouth at bedtime as needed for sleep. Take immediately before bedtime   EZETIMIBE  (ZETIA ) 10 MG TABLET    Take 1 tablet (10 mg total) by mouth daily.   GABAPENTIN  (NEURONTIN ) 100 MG CAPSULE    Take 1 capsule (100 mg  total) by mouth 3 (three) times daily.   HUMALOG  KWIKPEN 100 UNIT/ML KWIKPEN    10-15 units with meals 3 times a day, maximum 45 units/day.   INSULIN  GLARGINE (LANTUS  SOLOSTAR) 100 UNIT/ML SOLOSTAR PEN    Inject 18 Units into the skin at bedtime.   INSULIN  PEN NEEDLE 32G X 4 MM MISC    Use 4x a day   METHIMAZOLE  (TAPAZOLE ) 5 MG TABLET    Take 1 tablet (5 mg total) by mouth every morning.   METOCLOPRAMIDE  (REGLAN ) 10 MG TABLET    Take 1 tablet (10 mg total) by mouth every 6 (six) hours.   METOPROLOL  TARTRATE (LOPRESSOR ) 50 MG TABLET    Take 1 tablet (50 mg total) by mouth 2 (two) times daily.   NYSTATIN  (MYCOSTATIN ) 100000  UNIT/ML SUSPENSION    Take 5 mLs (500,000 Units total) by mouth 4 (four) times daily.   NYSTATIN  CREAM (MYCOSTATIN )    Apply 1 Application topically 2 (two) times daily.   OLMESARTAN -HYDROCHLOROTHIAZIDE  (BENICAR  HCT) 40-25 MG TABLET    Take 1 tablet by mouth daily.   ONDANSETRON  (ZOFRAN -ODT) 4 MG DISINTEGRATING TABLET    Take 1 tablet (4 mg total) by mouth every 8 (eight) hours as needed for nausea or vomiting (dissolve orally).   PANTOPRAZOLE  (PROTONIX ) 40 MG TABLET    Take 1 tablet (40 mg total) by mouth 2 (two) times daily.   POLYETHYLENE GLYCOL POWDER (GLYCOLAX /MIRALAX ) 17 GM/SCOOP POWDER    Take 17 g by mouth daily.   RIVAROXABAN  (XARELTO ) 10 MG TABS TABLET    Take 1 tablet (10 mg total) by mouth daily.   SEMAGLUTIDE , 1 MG/DOSE, 4 MG/3ML SOPN    Inject 1 mg as directed once a week.   SENNA-DOCUSATE (SENOKOT-S) 8.6-50 MG TABLET    Take 2 tablets by mouth 2 (two) times daily.   TAMSULOSIN  (FLOMAX ) 0.4 MG CAPS CAPSULE    Take 1 capsule (0.4 mg total) by mouth at bedtime.   TOPIRAMATE  (TOPAMAX ) 25 MG TABLET    Take 1 tablet (25 mg total) by mouth 2 (two) times daily.  Modified Medications   No medications on file  Discontinued Medications   No medications on file    Physical Exam:  Vitals:   02/03/24 1504  BP: 128/86  Pulse: 96  Temp: (!) 97.2 F (36.2 C)  SpO2: 99%    There is no height or weight on file to calculate BMI. Wt Readings from Last 3 Encounters:  01/30/24 154 lb 5.2 oz (70 kg)  01/09/24 155 lb (70.3 kg)  12/09/23 155 lb (70.3 kg)    Physical Exam Constitutional:      General: She is not in acute distress.    Appearance: She is well-developed. She is not diaphoretic.  HENT:     Head: Normocephalic and atraumatic.     Mouth/Throat:     Pharynx: No oropharyngeal exudate.  Eyes:     Conjunctiva/sclera: Conjunctivae normal.     Pupils: Pupils are equal, round, and reactive to light.  Cardiovascular:     Rate and Rhythm: Normal rate and regular rhythm.     Heart sounds: Normal heart sounds.  Pulmonary:     Effort: Pulmonary effort is normal.     Breath sounds: Normal breath sounds.  Abdominal:     General: Bowel sounds are normal.     Palpations: Abdomen is soft.  Musculoskeletal:     Cervical back: Normal range of motion and neck supple.     Right lower leg: No edema.     Left lower leg: No edema.  Skin:    General: Skin is warm and dry.  Neurological:     Mental Status: She is alert.  Psychiatric:        Mood and Affect: Mood normal.     Labs reviewed: Basic Metabolic Panel: Recent Labs    04/29/23 0519 04/30/23 0520 05/11/23 0851 05/12/23 0529 05/13/23 0453 05/15/23 1126 06/03/23 0530 06/04/23 0342 11/12/23 1201 11/17/23 1800 11/18/23 1952 11/26/23 1401  NA 144   < > 135   < > 131*   < >  --    < >  --  135 135 137  K 3.8   < > 2.9*   < > 3.2*   < >  --    < >  --  4.1 4.8 4.5  CL 110   < > 102   < > 102   < >  --    < >  --  106 106 102  CO2 25   < > 22   < > 23   < >  --    < >  --  19* 20* 25  GLUCOSE 103*   < > 204*   < > 180*   < >  --    < >  --  94 260* 213*  BUN 25*   < > 10   < > 10   < >  --    < >  --  19 18 19   CREATININE 1.46*   < > 0.72   < > 0.80   < >  --    < >  --  0.87 0.95 0.91  CALCIUM  9.1   < > 8.4*   < > 7.9*   < >  --    < >  --  9.3 8.7* 9.8  MG 1.7  --  1.4*  --  1.7  --   --    --   --   --  1.6* 1.8  PHOS 3.5  --   --   --   --   --   --   --   --   --  3.2  --   TSH 0.475  --  0.437  --   --   --  0.416  --  0.26*  --   --   --    < > = values in this interval not displayed.   Liver Function Tests: Recent Labs    06/15/23 0744 10/27/23 1407 11/17/23 1800 11/18/23 1952 11/26/23 1401  AST 14*   < > 17 17 12   ALT 11   < > 18 18 16   ALKPHOS 60  --  85 85  --   BILITOT 0.6   < > 0.3 0.4 0.3  PROT 6.9   < > 6.6 6.7 6.9  ALBUMIN  3.3*  --  3.1* 2.9*  --    < > = values in this interval not displayed.   Recent Labs    05/29/23 1257 06/02/23 2328 06/15/23 0744  LIPASE 22 19 38   No results for input(s): AMMONIA in the last 8760 hours. CBC: Recent Labs    10/27/23 1407 11/17/23 1800 11/18/23 1952 11/26/23 1401  WBC 8.8 8.0 6.3 7.1  NEUTROABS 5,166 3.4  --  3,962  HGB 13.5 11.5* 11.7* 11.9  HCT 43.6 35.8* 36.6 38.5  MCV 80.9 78.0* 78.9* 81.2  PLT 429* 351 313 373   Lipid Panel: Recent Labs    04/25/23 1430 10/27/23 1407  CHOL 273* 158  HDL 55 57  LDLCALC 187* 81  TRIG 165* 105  CHOLHDL 5.0* 2.8   TSH: Recent Labs    05/11/23 0851 06/03/23 0530 11/12/23 1201  TSH 0.437 0.416 0.26*   A1C: Lab Results  Component Value Date   HGBA1C 9.1 (H) 11/17/2023     Assessment/Plan Assessment and Plan Assessment & Plan Acute cough with chest congestion Cough with mucus production and chest congestion for two weeks. COVID and flu tests negative. Oxygen levels normal.  - Add Mucinex  DM by mouth twice daily with a full glass of water  for seven days. - Increase fluid intake. - Monitor for fever, chills, or worsening symptoms and report if she occurs.  Type  2 diabetes mellitus with hypoglycemia Blood sugar dropped to 45 mg/dL due to inconsistent eating habits. A1c is high. Currently on Humalog  and Ozempic . Meal coverage adjustment needed due to hypoglycemia. - Ensure consistent eating habits, including a bedtime snack. - Contact  endocrine for guidance on adjusting Humalog  dosage.  Urinary tract infection Previously treated with antibiotics. Current prescription of Levaquin  500 mg daily for three days which they have to pick up at pharmacy per ED - Start Levaquin  500 mg daily for three days. - Monitor for fever, chills, or worsening symptoms and report if she occurs.     Damir Leung K. Caro BODILY [facility] [phone]     [1]  Allergies Allergen Reactions   Dorethia Binning ] Anaphylaxis and Swelling   Contrast Media [Iodinated Contrast Media] Anaphylaxis and Swelling   Imitrex [Sumatriptan] Anaphylaxis   Ms Contin  [Morphine ] Anaphylaxis and Swelling   Penicillins Swelling and Other (See Comments)    Mouth and eyes swell   Chlorhexidine  Gluconate [Chlorhexidine ] Itching and Rash   Firvanq  [Vancomycin ] Other (See Comments)    Red Man's Syndrome   Cipro  [Ciprofloxacin  Hcl] Nausea And Vomiting   Nitrofuran Derivatives Anxiety   Wound Dressing Adhesive Itching and Rash   "

## 2024-02-03 NOTE — Patient Instructions (Addendum)
 MUCINEX  DM by mouth twice daily with full glass of water  for 7 days  Increase fluid intake.

## 2024-02-04 ENCOUNTER — Ambulatory Visit: Payer: Self-pay

## 2024-02-04 LAB — POCT INFLUENZA A/B
Influenza A, POC: NEGATIVE
Influenza B, POC: NEGATIVE

## 2024-02-04 LAB — POC COVID19 BINAXNOW: SARS Coronavirus 2 Ag: NEGATIVE

## 2024-02-04 NOTE — Telephone Encounter (Signed)
 [name] or Action Required?: [name] for provider: ED advised.: pt refused - stated only wants to get ABX changed to something else and plans to continue to push lots of fluids  Patient was last seen in primary care on 02/03/2024 by Lori Harlene POUR, NP.  Called Nurse Triage reporting Nausea.  Symptoms began yesterday.  Interventions attempted: Prescription medications: ABX.  Symptoms are: unchanged.  Triage Disposition: Go to ED Now (or PCP Triage)  Patient/caregiver understands and will follow disposition?: No, refuses disposition   Copied from CRM #[id number]. Topic: Clinical - Red Word Triage >> Feb 04, 2024  3:43 PM Lori [name] wrote: Red Word that prompted transfer to Nurse Triage: Patient and patient advocate Lori [name]) are calling in stating that the patient has been throwing up since taking the antibiotic the doctor prescribed to her at her appointment on 12/30. Reason for Disposition  [1] SEVERE vomiting (e.g., 6 or more times/day) AND [2] present > 8 hours (Exception: Patient sounds well, is drinking liquids, does not sound dehydrated, and vomiting has lasted less than 24 hours.)  Answer Assessment - Initial Assessment Questions 1. VOMITING SEVERITY: How many times have you vomited in the past 24 hours?      4 yesterday and 3 today 2. ONSET: When did the vomiting begin?      yesterday 3. FLUIDS: What fluids or food have you vomited up today? Have you been able to keep any fluids down?     Has been able to keep some food/oatmeal down has been able to keep propel down 4. ABDOMEN PAIN: Are your having any abdomen pain? If Yes : How bad is it and what does it feel like? (e.g., crampy, dull, intermittent, constant)      no 5. DIARRHEA: Is there any diarrhea? If Yes, ask: How many times today?      no 6. CONTACTS: Is there anyone else in the family with the same symptoms?      na 7. CAUSE: What do you think is causing your vomiting?     ABX 8. HYDRATION  STATUS: Any signs of dehydration? (e.g., dry mouth [not only dry lips], too weak to stand) When did you last urinate?     na 9. OTHER SYMPTOMS: Do you have any other symptoms? (e.g., fever, headache, vertigo, vomiting blood or coffee grounds, recent head injury)     na 10. PREGNANCY: Is there any chance you are pregnant? When was your last menstrual period?       na  Protocols used: Vomiting-A-AH

## 2024-02-04 NOTE — Telephone Encounter (Signed)
 Attempt made to call CAL: no answer: pt would like a call back - wants to see if ABX can be changed to something different, please call pt

## 2024-02-06 NOTE — Telephone Encounter (Signed)
 See triage notes from triage nurse.

## 2024-02-06 NOTE — Telephone Encounter (Signed)
 Patient was seen and ABX prescribed by Harlene Caro Passy have NP Harlene address this request.

## 2024-02-06 NOTE — Telephone Encounter (Signed)
 Spoke with patient and stated that she was prescribed levaquin  and has completed the course for three days.   Message sent to Caro Harlene POUR, NP

## 2024-02-06 NOTE — Telephone Encounter (Signed)
 Spoke with patient in regards of the response of Eubanks, Jessica K, NP and has no questions or concerns at this time.

## 2024-02-06 NOTE — Telephone Encounter (Signed)
 To notify if symptoms do not improve Recommend bland diet and advance as tolerates

## 2024-02-06 NOTE — Telephone Encounter (Signed)
 She was prescribed levaquin  by the ED to take for 3 days- did she take entire course?

## 2024-02-06 NOTE — Telephone Encounter (Signed)
 Antibiotics were prescribed by the emergency department- how many doses did she take?

## 2024-02-06 NOTE — Telephone Encounter (Signed)
 This is the only medication that I saw that was prescribe from the ED sulfamethoxazole -trimethoprim  (BACTRIM  DS) 800-160 MG per tablet 1 tablet that was given on 01/30/24.

## 2024-02-10 ENCOUNTER — Telehealth: Payer: Self-pay

## 2024-02-10 NOTE — Telephone Encounter (Signed)
 We Rec'd a fax from [facility] [employer] [place] H[employer] D-SNP, Part D that sucralfate  suspension is not on patient's formulary and patient will receive a temporary 30 day supply only. It appears patient is no longer taking this medication according to med list reconcile on 11-2023 patient indicated she was not taking.  For further reference patient may need to try the tablets and make a slurry if it is prescribed in the future.  Contact number for member services: [phone] ([facility])

## 2024-02-11 ENCOUNTER — Other Ambulatory Visit: Payer: Self-pay | Admitting: Family

## 2024-02-13 ENCOUNTER — Other Ambulatory Visit: Payer: Self-pay | Admitting: Family

## 2024-02-13 DIAGNOSIS — E0590 Thyrotoxicosis, unspecified without thyrotoxic crisis or storm: Secondary | ICD-10-CM

## 2024-02-16 ENCOUNTER — Other Ambulatory Visit: Payer: Self-pay | Admitting: Family

## 2024-02-16 ENCOUNTER — Ambulatory Visit: Admitting: Endocrinology

## 2024-02-16 DIAGNOSIS — R112 Nausea with vomiting, unspecified: Secondary | ICD-10-CM

## 2024-02-16 NOTE — Telephone Encounter (Signed)
 This medication was last sent by Dr. Sudan Thapa MD 90 tabs 3 refills. Please advise if ok to refill

## 2024-02-16 NOTE — Telephone Encounter (Signed)
 Patient following up with Endocrinologist

## 2024-02-17 NOTE — Progress Notes (Signed)
 Patient No Show.

## 2024-02-18 ENCOUNTER — Other Ambulatory Visit: Payer: Self-pay | Admitting: Family

## 2024-02-18 DIAGNOSIS — E1169 Type 2 diabetes mellitus with other specified complication: Secondary | ICD-10-CM

## 2024-02-19 ENCOUNTER — Encounter: Payer: Self-pay | Admitting: Physical Medicine & Rehabilitation

## 2024-02-25 ENCOUNTER — Telehealth: Payer: Self-pay

## 2024-02-25 NOTE — Telephone Encounter (Signed)
 noted

## 2024-02-25 NOTE — Telephone Encounter (Signed)
 Will require a face to face office visit to order Home health services.

## 2024-02-25 NOTE — Telephone Encounter (Signed)
 Copied from CRM [id number]. Topic: Clinical - Medical Advice >> Feb 24, 2024 12:07 PM Farrel B wrote: Reason for CRM: Patient and  [name] of [employer] has called in to request new home health care request, script for all service would should be directed or to [employer] number provided. [phone] in which Ms. [name] states that the pt due to being bedridden from multiple strokes, needs well rounded services for assistance She has also requested a letter on behalf of pt from pcp disclosing the medical history. Ms. [name] has requested a call back to advise of status or any questions needing to be answered.  Please call [name] at [phone] >> Feb 25, 2024  1:26 PM Marda MATSU wrote: Patient Lori [name] is calling asking for the status of this request

## 2024-02-26 ENCOUNTER — Ambulatory Visit: Admitting: Family

## 2024-02-26 ENCOUNTER — Encounter: Payer: Self-pay | Admitting: Family

## 2024-02-26 ENCOUNTER — Telehealth: Payer: Self-pay

## 2024-02-26 VITALS — BP 110/70 | HR 117 | Temp 97.8°F | Resp 18 | Ht 62.0 in | Wt 177.6 lb

## 2024-02-26 DIAGNOSIS — Z8673 Personal history of transient ischemic attack (TIA), and cerebral infarction without residual deficits: Secondary | ICD-10-CM

## 2024-02-26 DIAGNOSIS — F0150 Vascular dementia without behavioral disturbance: Secondary | ICD-10-CM

## 2024-02-26 DIAGNOSIS — R3981 Functional urinary incontinence: Secondary | ICD-10-CM | POA: Diagnosis not present

## 2024-02-26 DIAGNOSIS — R2681 Unsteadiness on feet: Secondary | ICD-10-CM

## 2024-02-26 NOTE — Telephone Encounter (Signed)
 Please see letter also indicates assistance with ADL's which includes bathing and meals.

## 2024-02-26 NOTE — Telephone Encounter (Signed)
 Letter was sent Via MyChart as patient requested

## 2024-02-26 NOTE — Telephone Encounter (Signed)
 Copied from CRM [id number]. Topic: Clinical - Prescription Issue >> Feb 26, 2024  4:00 PM Cherylann RAMAN wrote: Reason for CRM: Patient states that she received a letter during her appt that she needs help with her medicine, however, she states also needs help with bathing and preparing meals. Please advise. She would like the updated letter to be uploaded into her MyChart acct stating her need for assistance with her medication, bathing, and preparing meals. Patient can be contacted at [phone] for additional information.

## 2024-03-01 ENCOUNTER — Other Ambulatory Visit: Payer: Self-pay | Admitting: Adult Health

## 2024-03-01 DIAGNOSIS — F5101 Primary insomnia: Secondary | ICD-10-CM

## 2024-03-01 NOTE — Telephone Encounter (Unsigned)
 Copied from CRM #[id number]. Topic: Clinical - Medication Refill >> Mar 01, 2024  2:40 PM DeAngela L wrote: Medication: eszopiclone  (LUNESTA ) 2 MG TABS tablet  Has the patient contacted their pharmacy? Yes  (Agent: If no, request that the patient contact the pharmacy for the refill. If patient does not wish to contact the pharmacy document the reason why and proceed with request.) (Agent: If yes, when and what did the pharmacy advise?)  This is the patient's preferred pharmacy:  [facility] - [place], [place] - [address] [place] [address] Phone: [phone] Fax: [phone]  Is this the correct pharmacy for this prescription? Yes  If no, delete pharmacy and type the correct one.   Has the prescription been filled recently? Yes   Is the patient out of the medication? Unknown   Has the patient been seen for an appointment in the last year OR does the patient have an upcoming appointment? Yes   Can we respond through MyChart? Unknown   Agent: Please be advised that Rx refills may take up to 3 business days. We ask that you follow-up with your pharmacy.

## 2024-03-02 NOTE — Telephone Encounter (Signed)
 Please advise if ok to refill. Last sent 01/22/2024 30 tabs 0 refills. Last sent by [facility].

## 2024-03-03 NOTE — Progress Notes (Signed)
 "  Provider: Johnie Stadel FNP-C   Susie Pousson C, NP  Patient Care Team: Mourad Cwikla, [name], NP as PCP - General (Family Medicine) Thukkani, Arun K, MD as PCP - Cardiology (Cardiology) [facility] Thapa, [place], MD as Consulting Physician (Endocrinology)  Extended Emergency Contact Information Primary Emergency Contact: [name],[name] Mobile Phone: [phone] Relation: Spouse Preferred language: English Interpreter needed? No Secondary Emergency Contact: [facility] Phone: [phone] Mobile Phone: [phone] Relation: Mother  Code Status:  Full Code  Goals of care: Advanced Directive information    02/26/2024   11:07 AM  Advanced Directives  Does Patient Have a Medical Advance Directive? No  Would patient like information on creating a medical advance directive? No - Patient declined     Chief Complaint  Patient presents with   Referral    Neurologist referral.    History of Present Illness   Lori [name] is a 54 year old female with a history of stroke who presents with concerns of cognitive decline and need for increased home care services.  She has been experiencing increased forgetfulness following a stroke, noted by both herself and her husband. She has difficulty with tasks she previously managed well, such as using her phone and computer, and performing mathematical equations. This cognitive decline is impacting her daily life, leading to frustration and depression.  She has a history of a stroke affecting the left side, resulting in an inability to use her left hand and requiring the use of a walker or power wheelchair for mobility. She experiences urinary and bowel incontinence, necessitating the use of diapers. No recent falls, but she requires assistance transferring from bed to chair. She experiences weakness in the left leg and has a contracted left hand.  She currently receives home care services  three times a week, which she pays for out of pocket. Her advocate is seeking additional support through insurance, as her current coverage is insufficient for her needs. There is a need for documentation of cognitive decline to potentially increase her home care hours.  She has a history of small vessel white matter disease, as noted in a CT scan from December 2025, which showed moderate chronic small vessel disease with an old infarct in the left precentral gyrus.  Her current medications include Ozempic , which she has recently stopped, and she has gained 20 pounds. She also takes gabapentin .   Past Medical History:  Diagnosis Date   Acute bronchitis 08/24/2021   Acute cystitis 12/26/2022   Acute metabolic encephalopathy 11/04/2022   Acute renal failure (ARF) 04/27/2023   AKI (acute kidney injury) 04/30/2022   Aspiration pneumonia (HCC) 10/13/2022   CAD (coronary artery disease)    Cerebral infarction, unspecified (HCC) 09/27/2012   Chest pain of uncertain etiology 10/06/2022   Depression    Diabetes mellitus without complication (HCC)    Diabetic hyperosmolar non-ketotic state (HCC) 01/19/2023   DKA (diabetic ketoacidosis) (HCC) 02/19/2021   History of CT scan    History of mammogram    History of MRI    Hypertension    Pheochromocytoma    Reversible cerebrovascular vasoconstriction syndrome    Spell of abnormal behavior    Stroke [facility])    Thyroid  disease    UTI (urinary tract infection) 11/2023   Past Surgical History:  Procedure Laterality Date   ABDOMINAL HYSTERECTOMY     BIOPSY  02/06/2023   Procedure: BIOPSY;  Surgeon: Leigh Elspeth SQUIBB, MD;  Location: [facility] ENDOSCOPY;  Service: Gastroenterology;;  CESAREAN SECTION     3   CORONARY ARTERY BYPASS GRAFT     ESOPHAGOGASTRODUODENOSCOPY (EGD) WITH PROPOFOL  N/A 02/06/2023   Procedure: ESOPHAGOGASTRODUODENOSCOPY (EGD) WITH PROPOFOL ;  Surgeon: Leigh Elspeth SQUIBB, MD;  Location: [facility] ENDOSCOPY;  Service: Gastroenterology;   Laterality: N/A;   Right adrenal gland removal for pheochromocytoma Right     Allergies[1]  Allergies as of 02/26/2024       Reactions   Dorethia Dus ] Anaphylaxis, Swelling   Contrast Media [iodinated Contrast Media] Anaphylaxis, Swelling   Imitrex [sumatriptan] Anaphylaxis   Ms Contin  [morphine ] Anaphylaxis, Swelling   Penicillins Swelling, Other (See Comments)   Mouth and eyes swell   Chlorhexidine  Gluconate [chlorhexidine ] Itching, Rash   Firvanq  [vancomycin ] Other (See Comments)   Red Man's Syndrome   Cipro  [ciprofloxacin  Hcl] Nausea And Vomiting   Nitrofuran Derivatives Anxiety   Wound Dressing Adhesive Itching, Rash        Medication List        Accurate as of February 26, 2024 11:59 PM. If you have any questions, ask your nurse or doctor.          STOP taking these medications    Ozempic  (1 MG/DOSE) 4 MG/3ML Sopn Generic drug: Semaglutide  (1 MG/DOSE) Stopped by: Roxan Plough, NP       TAKE these medications    acetaminophen  325 MG tablet Commonly known as: TYLENOL  Take 2 tablets (650 mg total) by mouth every 6 (six) hours as needed for mild pain (pain score 1-3) (or Fever >/= 101).   albuterol  108 (90 Base) MCG/ACT inhaler Commonly known as: VENTOLIN  HFA INHALE 2 PUFFS BY MOUTH EVERY 6 HOURS AS NEEDED FOR WHEEZING AND SHORTNESS OF BREATH   atorvastatin  80 MG tablet Commonly known as: LIPITOR  Take 1 tablet (80 mg total) by mouth every evening.   baclofen  10 MG tablet Commonly known as: LIORESAL  Take 1 tablet (10 mg total) by mouth 3 (three) times daily.   clopidogrel  75 MG tablet Commonly known as: PLAVIX  Take 1 tablet (75 mg total) by mouth daily.   cyclobenzaprine  5 MG tablet Commonly known as: FLEXERIL  Take 1 tablet (5 mg total) by mouth 3 (three) times daily as needed for muscle spasms.   escitalopram  20 MG tablet Commonly known as: LEXAPRO  Take 1 tablet (20 mg total) by mouth every morning.   eszopiclone  2 MG Tabs tablet Commonly  known as: LUNESTA  Take 1 tablet (2 mg total) by mouth at bedtime as needed for sleep. Take immediately before bedtime   ezetimibe  10 MG tablet Commonly known as: ZETIA  Take 1 tablet (10 mg total) by mouth daily.   FreeStyle Libre 3 Plus Sensor Misc 1 each by Does not apply route continuous. Change every 15 days. What changed:  how much to take how to take this when to take this additional instructions Another medication with the same name was removed. Continue taking this medication, and follow the directions you see here.   FreeStyle Libre 14 Day Sensor Misc USE TO MONITOR BLOOD SUGAR AS DIRECTED. CHANGE SENSOR EVERY 14 DAYS. What changed:  Another medication with the same name was changed. Make sure you understand how and when to take each. Another medication with the same name was removed. Continue taking this medication, and follow the directions you see here. Changed by: Roxan Plough, NP   gabapentin  100 MG capsule Commonly known as: NEURONTIN  Take 1 capsule (100 mg total) by mouth 3 (three) times daily.   HumaLOG  KwikPen 100 UNIT/ML KwikPen Generic drug:  insulin  lispro 10-15 units with meals 3 times a day, maximum 45 units/day. What changed:  how much to take how to take this when to take this additional instructions   Insulin  Pen Needle 32G X 4 MM Misc Use 4x a day   Lantus  SoloStar 100 UNIT/ML Solostar Pen Generic drug: insulin  glargine Inject 18 Units into the skin at bedtime.   methimazole  5 MG tablet Commonly known as: TAPAZOLE  Take 1 tablet (5 mg total) by mouth every morning.   metoCLOPramide  10 MG tablet Commonly known as: REGLAN  Take 1 tablet (10 mg total) by mouth every 6 (six) hours.   metoprolol  tartrate 50 MG tablet Commonly known as: LOPRESSOR  Take 1 tablet (50 mg total) by mouth 2 (two) times daily.   nystatin  100000 UNIT/ML suspension Commonly known as: MYCOSTATIN  Take 5 mLs (500,000 Units total) by mouth 4 (four) times daily.    nystatin  cream Commonly known as: MYCOSTATIN  Apply 1 Application topically 2 (two) times daily.   olmesartan -hydrochlorothiazide  40-25 MG tablet Commonly known as: BENICAR  HCT Take 1 tablet by mouth daily.   ondansetron  4 MG disintegrating tablet Commonly known as: ZOFRAN -ODT DISSOLVE ONE TABLET ON TOP OF TONGUE EVERY 8 HOURS AS NEEDED FOR NAUSEA OR FOR VOMITING What changed: See the new instructions.   pantoprazole  40 MG tablet Commonly known as: PROTONIX  Take 1 tablet (40 mg total) by mouth 2 (two) times daily.   PEPCID  AC PO Take by mouth as needed.   polyethylene glycol powder 17 GM/SCOOP powder Commonly known as: GLYCOLAX /MIRALAX  Take 17 g by mouth daily. What changed: when to take this   rivaroxaban  10 MG Tabs tablet Commonly known as: Xarelto  Take 1 tablet (10 mg total) by mouth daily.   senna-docusate 8.6-50 MG tablet Commonly known as: Senokot-S Take 2 tablets by mouth 2 (two) times daily.   tamsulosin  0.4 MG Caps capsule Commonly known as: FLOMAX  Take 1 capsule (0.4 mg total) by mouth at bedtime.   topiramate  25 MG tablet Commonly known as: TOPAMAX  Take 1 tablet (25 mg total) by mouth 2 (two) times daily.        Review of Systems  Constitutional:  Negative for appetite change, chills, fatigue, fever and unexpected weight change.  HENT:  Negative for congestion, dental problem, ear discharge, ear pain, facial swelling, hearing loss, nosebleeds, postnasal drip, rhinorrhea, sinus pressure, sinus pain, sneezing, sore throat, tinnitus and trouble swallowing.   Eyes:  Negative for pain, discharge, redness, itching and visual disturbance.  Respiratory:  Negative for cough, chest tightness, shortness of breath and wheezing.   Cardiovascular:  Negative for chest pain, palpitations and leg swelling.  Gastrointestinal:  Negative for abdominal distention, abdominal pain, blood in stool, constipation, diarrhea, nausea and vomiting.  Genitourinary:  Negative for  difficulty urinating, dysuria, flank pain and urgency.       Incontinent   Musculoskeletal:  Positive for gait problem. Negative for arthralgias, back pain, joint swelling, myalgias, neck pain and neck stiffness.  Skin:  Negative for color change, pallor, rash and wound.  Neurological:  Positive for weakness. Negative for dizziness, syncope, speech difficulty, light-headedness, numbness and headaches.       Left side weakness   Psychiatric/Behavioral:  Negative for agitation, behavioral problems, confusion, hallucinations and sleep disturbance. The patient is not nervous/anxious.        Forgetful     Immunization History  Administered Date(s) Administered   Influenza-Unspecified 11/04/2016   PPD Test 03/19/2016   Pfizer(Comirnaty)Fall Seasonal Vaccine 12 years and older 09/29/2019,  03/31/2020   Tdap 02/07/2014   Pertinent  Health Maintenance Due  Topic Date Due   FOOT EXAM  Never done   Mammogram  Never done   Colonoscopy  Never done   OPHTHALMOLOGY EXAM  02/19/2024   Influenza Vaccine  06/15/2024 (Originally 09/05/2023)   HEMOGLOBIN A1C  05/17/2024      10/10/2023    9:31 AM 10/27/2023    1:21 PM 10/28/2023    3:21 PM 12/09/2023   11:35 AM 02/26/2024   11:07 AM  Fall Risk  Falls in the past year? 0 0 0 0 0  Was there an injury with Fall?  0  0   0  Fall Risk Category Calculator  0 0  0  Patient at Risk for Falls Due to Impaired balance/gait;Impaired mobility No Fall Risks   No Fall Risks  Fall risk Follow up  Falls evaluation completed   Falls evaluation completed     Data saved with a previous flowsheet row definition   Functional Status Survey:    Vitals:   02/26/24 1111  BP: 110/70  Pulse: (!) 117  Resp: 18  Temp: 97.8 F (36.6 C)  SpO2: 99%  Weight: 177 lb 9.6 oz (80.6 kg)  Height: 5' 2 (1.575 m)   Body mass index is 32.48 kg/m. Physical Exam Physical Exam   VITALS: T- 97.8, P- 117, BP- 110/70, SaO2- 99% MEASUREMENTS: Weight- 177. GENERAL: Alert,  cooperative, well developed, no acute distress HEENT: Normocephalic, normal oropharynx, moist mucous membranes CHEST: Clear to auscultation bilaterally, no wheezes, rhonchi, or crackles CARDIOVASCULAR: Normal heart rate and rhythm, S1 and S2 normal without murmurs ABDOMEN: Soft, non-tender, non-distended, without organomegaly, normal bowel sounds EXTREMITIES: No cyanosis, edema, or swelling in legs NEUROLOGICAL: Awake and alert, cranial nerves grossly intact, moves all extremities without gross motor or sensory deficit except left side weakness Score 2 on 6 CIT      Labs reviewed: Recent Labs    04/29/23 0519 04/30/23 0520 05/13/23 0453 05/15/23 1126 11/17/23 1800 11/18/23 1952 11/26/23 1401  NA 144   < > 131*   < > 135 135 137  K 3.8   < > 3.2*   < > 4.1 4.8 4.5  CL 110   < > 102   < > 106 106 102  CO2 25   < > 23   < > 19* 20* 25  GLUCOSE 103*   < > 180*   < > 94 260* 213*  BUN 25*   < > 10   < > 19 18 19   CREATININE 1.46*   < > 0.80   < > 0.87 0.95 0.91  CALCIUM  9.1   < > 7.9*   < > 9.3 8.7* 9.8  MG 1.7   < > 1.7  --   --  1.6* 1.8  PHOS 3.5  --   --   --   --  3.2  --    < > = values in this interval not displayed.   Recent Labs    06/15/23 0744 10/27/23 1407 11/17/23 1800 11/18/23 1952 11/26/23 1401  AST 14*   < > 17 17 12   ALT 11   < > 18 18 16   ALKPHOS 60  --  85 85  --   BILITOT 0.6   < > 0.3 0.4 0.3  PROT 6.9   < > 6.6 6.7 6.9  ALBUMIN  3.3*  --  3.1* 2.9*  --    < > =  values in this interval not displayed.   Recent Labs    10/27/23 1407 11/17/23 1800 11/18/23 1952 11/26/23 1401  WBC 8.8 8.0 6.3 7.1  NEUTROABS 5,166 3.4  --  3,962  HGB 13.5 11.5* 11.7* 11.9  HCT 43.6 35.8* 36.6 38.5  MCV 80.9 78.0* 78.9* 81.2  PLT 429* 351 313 373   Lab Results  Component Value Date   TSH 0.26 (L) 11/12/2023   Lab Results  Component Value Date   HGBA1C 9.1 (H) 11/17/2023   Lab Results  Component Value Date   CHOL 158 10/27/2023   HDL 57 10/27/2023    LDLCALC 81 10/27/2023   TRIG 105 10/27/2023   CHOLHDL 2.8 10/27/2023    Significant Diagnostic Results in last 30 days:  No results found.  Assessment/Plan Assessment and Plan    Cognitive impairment following cerebrovascular accident Cognitive impairment likely related to cerebrovascular accident with forgetfulness and difficulty with tasks such as using a phone and computer. Memory screening test (6CIT) performed, indicating some memory issues but score 2 point. CT scan showed old inferior cerebral infarct and moderate chronic small vessel disease, consistent with post-stroke cognitive changes. - Performed memory screening test (6CIT) - Wrote a letter for insurance to support increased home care hours due to cognitive impairment - Referred to neurologist for further evaluation of cognitive impairment  Chronic left hemiplegia following cerebrovascular accident Chronic left hemiplegia with left hand contraction and weakness in the left leg. Requires assistance for transfers and uses a wheelchair for mobility. - Wrote a letter for insurance to support increased home care hours due to physical impairment - Referred to neurologist for evaluation of gabapentin  tapering  Functional urinary and fecal incontinence Functional urinary and fecal incontinence requiring use of diapers. No wounds on the bottom. - Wrote a physicist, medical for insurance to support increased home care hours due to incontinence  Unsteady gait Requiring use of a walker when home health aide is present and a wheelchair for regular mobility. - Wrote a letter for insurance to support increased home care hours due to mobility issues - Continue with fall and safety precautions   Family/ staff Communication: Reviewed plan of care with patient and Husband verbalized understanding   Labs/tests ordered: None   Next Appointment : Return if symptoms worsen or fail to improve.   Spent 30 minutes of Face to face and non-face to face  with patient  >50% time spent counseling; reviewing medical record; tests; labs; documentation and developing future plan of care.   [name] Falyn Rubel, NP      [1]  Allergies Allergen Reactions   Dorethia Fabry ] Anaphylaxis and Swelling   Contrast Media [Iodinated Contrast Media] Anaphylaxis and Swelling   Imitrex [Sumatriptan] Anaphylaxis   Ms Contin  [Morphine ] Anaphylaxis and Swelling   Penicillins Swelling and Other (See Comments)    Mouth and eyes swell   Chlorhexidine  Gluconate [Chlorhexidine ] Itching and Rash   Firvanq  [Vancomycin ] Other (See Comments)    Red Man's Syndrome   Cipro  [Ciprofloxacin  Hcl] Nausea And Vomiting   Nitrofuran Derivatives Anxiety   Wound Dressing Adhesive Itching and Rash   "

## 2024-03-04 ENCOUNTER — Encounter: Admitting: Physical Medicine & Rehabilitation

## 2024-03-04 ENCOUNTER — Other Ambulatory Visit: Payer: Self-pay

## 2024-03-09 ENCOUNTER — Telehealth: Payer: Self-pay

## 2024-03-09 DIAGNOSIS — I10 Essential (primary) hypertension: Secondary | ICD-10-CM

## 2024-03-09 MED ORDER — OLMESARTAN MEDOXOMIL-HCTZ 40-25 MG PO TABS: 1.0000 | ORAL_TABLET | Freq: Every day | ORAL | 1 refills | Status: AC

## 2024-03-09 NOTE — Telephone Encounter (Signed)
 Rx has been sent a patient requested

## 2024-03-09 NOTE — Telephone Encounter (Signed)
 Copied from CRM #[id number]. Topic: Clinical - Medication Refill >> Mar 09, 2024  1:05 PM [name] wrote: Medication: olmesartan -hydrochlorothiazide  olmesartan -hydrochlorothiazide  (BENICAR  HCT) 40-25 MG tablet   Has the patient contacted their pharmacy? Yes (Agent: If no, request that the patient contact the pharmacy for the refill. If patient does not wish to contact the pharmacy document the reason why and proceed with request.) (Agent: If yes, when and what did the pharmacy advise?)  This is the patient's preferred pharmacy:  [facility] - [place], [place] - [address] [place] [address] Phone: [phone] Fax: [phone]   Is this the correct pharmacy for this prescription? Yes If no, delete pharmacy and type the correct one.   Has the prescription been filled recently? No  Is the patient out of the medication? Yes  Has the patient been seen for an appointment in the last year OR does the patient have an upcoming appointment? Yes  Can we respond through MyChart? Yes  Agent: Please be advised that Rx refills may take up to 3 business days. We ask that you follow-up with your pharmacy.

## 2024-03-16 ENCOUNTER — Encounter: Payer: Self-pay | Attending: Physical Medicine & Rehabilitation | Admitting: Physical Medicine & Rehabilitation

## 2024-04-12 ENCOUNTER — Encounter: Admitting: Physical Medicine and Rehabilitation

## 2024-04-26 ENCOUNTER — Ambulatory Visit: Payer: Self-pay | Admitting: Family
